# Patient Record
Sex: Male | Born: 1960 | Race: Black or African American | Hispanic: No | Marital: Single | Smoking: Never smoker
Health system: Southern US, Community
[De-identification: ages and names within clinical notes are randomized; demographics above are authoritative.]

## PROBLEM LIST (undated history)

## (undated) DIAGNOSIS — N186 End stage renal disease: Secondary | ICD-10-CM

## (undated) DIAGNOSIS — I10 Essential (primary) hypertension: Secondary | ICD-10-CM

## (undated) DIAGNOSIS — Z992 Dependence on renal dialysis: Secondary | ICD-10-CM

## (undated) DIAGNOSIS — F209 Schizophrenia, unspecified: Secondary | ICD-10-CM

## (undated) DIAGNOSIS — M1990 Unspecified osteoarthritis, unspecified site: Secondary | ICD-10-CM

## (undated) DIAGNOSIS — D649 Anemia, unspecified: Secondary | ICD-10-CM

## (undated) DIAGNOSIS — J449 Chronic obstructive pulmonary disease, unspecified: Secondary | ICD-10-CM

## (undated) DIAGNOSIS — E785 Hyperlipidemia, unspecified: Secondary | ICD-10-CM

## (undated) DIAGNOSIS — I701 Atherosclerosis of renal artery: Secondary | ICD-10-CM

## (undated) DIAGNOSIS — J9610 Chronic respiratory failure, unspecified whether with hypoxia or hypercapnia: Secondary | ICD-10-CM

## (undated) DIAGNOSIS — E119 Type 2 diabetes mellitus without complications: Secondary | ICD-10-CM

## (undated) DIAGNOSIS — I82409 Acute embolism and thrombosis of unspecified deep veins of unspecified lower extremity: Secondary | ICD-10-CM

## (undated) HISTORY — PX: AV FISTULA PLACEMENT: SHX1204

## (undated) HISTORY — DX: Schizophrenia, unspecified: F20.9

## (undated) HISTORY — PX: OTHER SURGICAL HISTORY: SHX169

## (undated) HISTORY — PX: INSERTION OF DIALYSIS CATHETER: SHX1324

## (undated) HISTORY — DX: Essential (primary) hypertension: I10

## (undated) HISTORY — DX: End stage renal disease: N18.6

## (undated) HISTORY — PX: TONSILLECTOMY: SUR1361

---

## 2007-05-30 ENCOUNTER — Encounter (HOSPITAL_COMMUNITY): Admission: RE | Admit: 2007-05-30 | Discharge: 2007-06-01 | Payer: Self-pay | Admitting: Endocrinology

## 2008-04-19 ENCOUNTER — Encounter (HOSPITAL_COMMUNITY): Admission: RE | Admit: 2008-04-19 | Discharge: 2008-05-19 | Payer: Self-pay | Admitting: Endocrinology

## 2012-12-20 ENCOUNTER — Other Ambulatory Visit (HOSPITAL_COMMUNITY): Payer: Self-pay | Admitting: Nephrology

## 2012-12-20 DIAGNOSIS — N289 Disorder of kidney and ureter, unspecified: Secondary | ICD-10-CM

## 2013-01-10 ENCOUNTER — Ambulatory Visit (HOSPITAL_COMMUNITY)
Admission: RE | Admit: 2013-01-10 | Discharge: 2013-01-10 | Disposition: A | Discharge status: Home / Self Care | Payer: Medicaid Other | Source: Ambulatory Visit | Attending: Nephrology | Admitting: Nephrology

## 2013-01-10 DIAGNOSIS — Q619 Cystic kidney disease, unspecified: Secondary | ICD-10-CM | POA: Insufficient documentation

## 2013-01-10 DIAGNOSIS — N289 Disorder of kidney and ureter, unspecified: Secondary | ICD-10-CM

## 2013-02-15 ENCOUNTER — Encounter (INDEPENDENT_AMBULATORY_CARE_PROVIDER_SITE_OTHER): Payer: Self-pay | Admitting: *Deleted

## 2013-03-22 ENCOUNTER — Ambulatory Visit (INDEPENDENT_AMBULATORY_CARE_PROVIDER_SITE_OTHER): Payer: Medicaid Other | Admitting: Internal Medicine

## 2013-03-23 ENCOUNTER — Encounter (INDEPENDENT_AMBULATORY_CARE_PROVIDER_SITE_OTHER): Payer: Self-pay | Admitting: Internal Medicine

## 2013-03-23 ENCOUNTER — Ambulatory Visit (INDEPENDENT_AMBULATORY_CARE_PROVIDER_SITE_OTHER): Payer: Medicaid Other | Admitting: Internal Medicine

## 2013-03-23 VITALS — BP 112/70 | HR 60 | Temp 98.8°F | Ht 76.0 in | Wt 175.8 lb

## 2013-03-23 DIAGNOSIS — B1920 Unspecified viral hepatitis C without hepatic coma: Secondary | ICD-10-CM

## 2013-03-23 DIAGNOSIS — I10 Essential (primary) hypertension: Secondary | ICD-10-CM

## 2013-03-23 DIAGNOSIS — F209 Schizophrenia, unspecified: Secondary | ICD-10-CM | POA: Insufficient documentation

## 2013-03-23 DIAGNOSIS — N189 Chronic kidney disease, unspecified: Secondary | ICD-10-CM

## 2013-03-23 NOTE — Progress Notes (Signed)
Subjective:     Patient ID: Todd Brown, male   DOB: 01/17/1961, 52 y.o.   MRN: [id number]  HPI Patient is a 52 yr old black male referred to our office by Dr. Hinda Lenis for a positive Hepatitis C.  He is a resident of [place]. Hx of Schizophrenia. Resident for 6-7 yrs. Before [facility], He was at [facility] which has now closed. He does not have tattoos. He has never received a blood transfusion. He denies IV drug use. Appetite is good. No weight loss. No abdominal pain.  He usually has a BM once a day. No melena or bright red rectal bleeding.  It appears he does not have risk factors for Hepatitis C. His last office visit with Dr. Hinda Lenis in September, he was taken off Naproxen.   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 8.06/2012 H and H 11.5 and 33.6, TIBC 161, UIBC 268, % Sat 40, NA 134, K 4.2, Chloride 102, glucose 77, BUN 39, Creatinine 3.18, Albumin 4.3, Calcium 9.1, Phosphorus 4.5, Vitamin B12 958, Folate greater than 20, Ferritin 302,, HA1C 5.4 Hepatitis B Surface Antigen negative Hepatitis C antibody reactive.     Current Outpatient Prescriptions  Medication Sig Dispense Refill  . amLODipine (NORVASC) 5 MG tablet Take 5 mg by mouth daily.      Marland Kitchen atenolol (TENORMIN) 25 MG tablet Take 25 mg by mouth daily.      . baclofen (LIORESAL) 10 MG tablet Take 10 mg by mouth 3 (three) times daily.      . carbamazepine (TEGRETOL) 200 MG tablet Take 200 mg by mouth 2 (two) times daily. Taken 2 1/2 tabs (500mg  ) twice a day      . citalopram (CELEXA) 20 MG tablet Take 20 mg by mouth daily.      . Fe Fum-FA-B Cmp-C-Zn-Mg-Mn-Cu (FERROCITE PLUS PO) Take by mouth daily.      . fluPHENAZine (PROLIXIN) 2.5 MG tablet Take 2.5 mg by mouth daily.      . fluPHENAZine decanoate (PROLIXIN) 25 MG/ML injection Inject 37.5 mg into the muscle every 14 (fourteen) days.      Marland Kitchen lisinopril (PRINIVIL,ZESTRIL) 5 MG tablet Take 5 mg by mouth daily.      Marland Kitchen omeprazole (PRILOSEC)  20 MG capsule Take 20 mg by mouth daily.      . traMADol (ULTRAM) 50 MG tablet Take 50 mg by mouth every 6 (six) hours as needed for pain.      . trihexyphenidyl (ARTANE) 5 MG tablet Take 5 mg by mouth 3 (three) times daily with meals.       No current facility-administered medications for this visit.   Past Medical History  Diagnosis Date  . ESRD (end stage renal disease)   . Hypertension   . Schizophrenia    Past Surgical History  Procedure Laterality Date  . Repair fx left lower leg      yrs ago (age 52)   Allergies  Allergen Reactions  . Thorazine [Chlorpromazine]      Review of Systems       Objective:   Physical Exam  Filed Vitals:   03/23/13 1432  BP: 112/70  Pulse: 60  Temp: 98.8 F (37.1 C)  Height: 6\' 4"  (1.93 m)  Weight: 175 lb 12.8 oz (79.742 kg)   Alert and oriented. Skin warm and dry. Oral mucosa is moist.   . Sclera anicteric, conjunctivae is pink. Thyroid not enlarged. No cervical lymphadenopathy. Lungs clear. Heart  regular rate and rhythm.  Abdomen is soft. Bowel sounds are positive. No hepatomegaly. No abdominal masses felt. No tenderness.  No edema to lower extremities.       Assessment:     Hepatitis C.He does not have any risk factors. Will see if he has a viral load.     Plan:    Hepatitis C quaint. Hepatitis C genotype, AFP, PT/INR, TSH,Cmet, US abdomen. OV in 2 months. I will need to discuss with DR. Rehman if he has a viral load. Hx of schizophrenia.

## 2013-03-23 NOTE — Patient Instructions (Signed)
Labs. OV in 2 months.

## 2013-03-24 LAB — COMPREHENSIVE METABOLIC PANEL
Albumin: 3.8 g/dL (ref 3.5–5.2)
Alkaline Phosphatase: 86 U/L (ref 39–117)
BUN: 46 mg/dL — ABNORMAL HIGH (ref 6–23)
Glucose, Bld: 102 mg/dL — ABNORMAL HIGH (ref 70–99)
Potassium: 5.6 mEq/L — ABNORMAL HIGH (ref 3.5–5.3)
Total Bilirubin: 0.3 mg/dL (ref 0.3–1.2)

## 2013-03-24 LAB — AFP TUMOR MARKER: AFP-Tumor Marker: 2.7 ng/mL (ref 0.0–8.0)

## 2013-03-27 ENCOUNTER — Other Ambulatory Visit (INDEPENDENT_AMBULATORY_CARE_PROVIDER_SITE_OTHER): Payer: Self-pay | Admitting: Internal Medicine

## 2013-03-27 ENCOUNTER — Telehealth (INDEPENDENT_AMBULATORY_CARE_PROVIDER_SITE_OTHER): Payer: Self-pay | Admitting: Internal Medicine

## 2013-03-27 ENCOUNTER — Ambulatory Visit (HOSPITAL_COMMUNITY)
Admission: RE | Admit: 2013-03-27 | Discharge: 2013-03-27 | Disposition: A | Discharge status: Home / Self Care | Payer: Medicaid Other | Source: Ambulatory Visit | Attending: Internal Medicine | Admitting: Internal Medicine

## 2013-03-27 DIAGNOSIS — B1920 Unspecified viral hepatitis C without hepatic coma: Secondary | ICD-10-CM

## 2013-03-27 LAB — HEPATITIS C RNA QUANTITATIVE: HCV Quantitative: NOT DETECTED IU/mL (ref <15)

## 2013-03-27 NOTE — Telephone Encounter (Signed)
Results given to caregiver.

## 2013-03-28 LAB — HEPATITIS C GENOTYPE

## 2013-03-29 ENCOUNTER — Telehealth (INDEPENDENT_AMBULATORY_CARE_PROVIDER_SITE_OTHER): Payer: Self-pay | Admitting: *Deleted

## 2013-03-29 DIAGNOSIS — B1920 Unspecified viral hepatitis C without hepatic coma: Secondary | ICD-10-CM

## 2013-03-29 NOTE — Telephone Encounter (Signed)
.  Per Lelon Perla, patient to have labs in 6 months.

## 2013-04-06 NOTE — Progress Notes (Signed)
Apt has been moved to 09/26/13 with Deberah Castle, NP.

## 2013-04-06 NOTE — Progress Notes (Signed)
Apt has been moved out to 09/26/13 with Deberah Castle, NP.

## 2013-05-23 ENCOUNTER — Ambulatory Visit (INDEPENDENT_AMBULATORY_CARE_PROVIDER_SITE_OTHER): Payer: Medicaid Other | Admitting: Internal Medicine

## 2013-05-30 ENCOUNTER — Telehealth: Payer: Self-pay

## 2013-05-30 NOTE — Telephone Encounter (Signed)
Pt was referred by Dr. Legrand Rams for screening colonoscopy.

## 2013-05-30 NOTE — Telephone Encounter (Signed)
I called Levie Heritage, at [facility], and scheduled pt an OV with Laban Emperor, NP on 06/27/2013 at 11:00 AM due to meds.

## 2013-06-27 ENCOUNTER — Ambulatory Visit: Payer: Medicaid Other | Admitting: Gastroenterology

## 2013-08-23 ENCOUNTER — Encounter (INDEPENDENT_AMBULATORY_CARE_PROVIDER_SITE_OTHER): Payer: Self-pay | Admitting: *Deleted

## 2013-08-23 ENCOUNTER — Other Ambulatory Visit (INDEPENDENT_AMBULATORY_CARE_PROVIDER_SITE_OTHER): Payer: Self-pay | Admitting: *Deleted

## 2013-08-23 DIAGNOSIS — B1920 Unspecified viral hepatitis C without hepatic coma: Secondary | ICD-10-CM

## 2013-09-26 ENCOUNTER — Ambulatory Visit (INDEPENDENT_AMBULATORY_CARE_PROVIDER_SITE_OTHER): Payer: Medicaid Other | Admitting: Internal Medicine

## 2013-09-26 ENCOUNTER — Encounter (INDEPENDENT_AMBULATORY_CARE_PROVIDER_SITE_OTHER): Payer: Self-pay | Admitting: Internal Medicine

## 2013-09-26 VITALS — BP 140/82 | HR 84 | Temp 98.1°F | Ht 75.0 in | Wt 177.5 lb

## 2013-09-26 DIAGNOSIS — B1920 Unspecified viral hepatitis C without hepatic coma: Secondary | ICD-10-CM

## 2013-09-26 NOTE — Patient Instructions (Signed)
Hep C antibody, Hep C quaint.  Further recommendations to follow.

## 2013-09-26 NOTE — Progress Notes (Signed)
Subjective:     Patient ID: Todd Brown, male   DOB: [birth year], 53 y.o.   MRN: [id number]  HPI Here today for f/u of his Hepatitis C. Last seen in October of 2014. Noted to have a positive Hep C antibody. Hep Quaint was undetectable. AFP 2.7. Referred by Dr. Hinda Lenis. Resident of [place]. Hx of Schizophrenia. Has been a resident for 6-7 yrs. There were no risk factors for Hepatitis C. No IV drugs. No tattoos.  Appetite is good. No weight loss. Usually has a BM daily. 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  8.06/2012 H and H 11.5 and 33.6, TIBC 161, UIBC 268, % Sat 40, NA 134, K 4.2, Chloride 102, glucose 77, BUN 39, Creatinine 3.18, Albumin 4.3, Calcium 9.1, Phosphorus 4.5, Vitamin B12 958, Folate greater than 20, Ferritin 302,, HA1C 5.4  Hepatitis B Surface Antigen negative Hepatitis C antibody reactive.      Review of Systems Past Medical History  Diagnosis Date  . ESRD (end stage renal disease)   . Hypertension   . Schizophrenia     Past Surgical History  Procedure Laterality Date  . Repair fx left lower leg      yrs ago (age 11)    Allergies  Allergen Reactions  . Thorazine [Chlorpromazine]     Current Outpatient Prescriptions on File Prior to Visit  Medication Sig Dispense Refill  . amLODipine (NORVASC) 5 MG tablet Take 5 mg by mouth daily.      Marland Kitchen atenolol (TENORMIN) 25 MG tablet Take 25 mg by mouth daily.      . baclofen (LIORESAL) 10 MG tablet Take 10 mg by mouth 3 (three) times daily.      .  carbamazepine (TEGRETOL) 200 MG tablet Take 200 mg by mouth 2 (two) times daily. Taken 2 1/2 tabs (500mg  ) twice a day      . Fe Fum-FA-B Cmp-C-Zn-Mg-Mn-Cu (FERROCITE PLUS PO) Take by mouth daily.      . fluPHENAZine (PROLIXIN) 2.5 MG tablet Take 2.5 mg by mouth daily.      Marland Kitchen lisinopril (PRINIVIL,ZESTRIL) 5 MG tablet Take 5 mg by mouth daily.      Marland Kitchen omeprazole (PRILOSEC) 20 MG capsule Take 20 mg by mouth daily.      . traMADol (ULTRAM) 50 MG tablet Take 50 mg by mouth every 6 (six) hours as needed for pain.      . fluPHENAZine decanoate (PROLIXIN) 25 MG/ML injection Inject 37.5 mg into the muscle every 14 (fourteen) days.       No current facility-administered medications on file prior to visit.        Objective:   Physical Exam Filed Vitals:   09/26/13 1456  BP: 140/82  Pulse: 84  Temp: 98.1 F (36.7 C)  Height: 6\' 3"  (1.905 m)  Weight: 177 lb 8 oz (80.513 kg)  Alert and oriented. Skin warm and  dry. Oral mucosa is moist.  Endentulous . Sclera anicteric, conjunctivae is pink. Thyroid not enlarged. No cervical lymphadenopathy. Lungs clear. Heart regular rate and rhythm.  Abdomen is soft. Bowel sounds are positive. No hepatomegaly. No abdominal masses felt. No tenderness.  No edema to lower extremities.      Assessment:     Hepatits C with undetectable viral load. He is doing well. ? Whether this is a false positive antibody test.    Plan:    Hep C antibody, Hep C quain PCR. Further recommendations to follow.  If Hep C quaint negative, will see in a year.

## 2013-09-27 LAB — HEPATITIS C ANTIBODY: HCV Ab: NEGATIVE

## 2013-09-28 LAB — HEPATITIS C RNA QUANTITATIVE: HCV Quantitative: NOT DETECTED IU/mL (ref <15)

## 2013-11-24 ENCOUNTER — Encounter (INDEPENDENT_AMBULATORY_CARE_PROVIDER_SITE_OTHER): Payer: Self-pay

## 2014-02-23 ENCOUNTER — Telehealth: Payer: Self-pay

## 2014-02-23 NOTE — Telephone Encounter (Signed)
PLEASE CALL [facility] TO SCHEDULE COLONOSCOPY FOR PATIENT  [phone]

## 2014-02-27 NOTE — Telephone Encounter (Signed)
[facility], she gave me some info and will fax the medication list over.

## 2014-03-28 NOTE — Telephone Encounter (Signed)
I called and updated the triage info with [facility]. Pt is capable of signing for himself. Sending triage to Dr. Oneida Alar before appt given.

## 2014-03-28 NOTE — Telephone Encounter (Signed)
Gastroenterology Pre-Procedure Review  Request Date: 03/28/2014 Requesting Physician: Dr. Legrand Rams  PATIENT REVIEW QUESTIONS: The patient responded to the following health history questions as indicated:    1. Diabetes Melitis: no 2. Joint replacements in the past 12 months: no 3. Major health problems in the past 3 months: no 4. Has an artificial valve or MVP: no 5. Has a defibrillator: no 6. Has been advised in past to take antibiotics in advance of a procedure like teeth cleaning: no    MEDICATIONS & ALLERGIES:    Patient reports the following regarding taking any blood thinners:   Plavix? no Aspirin? no Coumadin? no  Patient confirms/reports the following medications:  Current Outpatient Prescriptions  Medication Sig Dispense Refill  . amLODipine (NORVASC) 5 MG tablet Take 5 mg by mouth daily.      Marland Kitchen atenolol (TENORMIN) 25 MG tablet Take 25 mg by mouth 2 (two) times daily.       . carbamazepine (TEGRETOL) 200 MG tablet Take 200 mg by mouth 2 (two) times daily. Taken 2 1/2 tabs (500mg  ) twice a day      . citalopram (CELEXA) 20 MG tablet Take 20 mg by mouth daily.      . Fe Fum-FA-B Cmp-C-Zn-Mg-Mn-Cu (FERROCITE PLUS PO) Take by mouth daily.      . fluPHENAZine (PROLIXIN) 2.5 MG tablet Take 2.5 mg by mouth daily.      . fluPHENAZine decanoate (PROLIXIN) 25 MG/ML injection Inject 37.5 mg into the muscle every 14 (fourteen) days.      . NON FORMULARY Vitamin D 1000 softgels     One capsule by mouth daily      . omeprazole (PRILOSEC) 20 MG capsule Take 20 mg by mouth daily.      . trihexyphenidyl (ARTANE) 5 MG tablet Take 5 mg by mouth 3 (three) times daily with meals.      . baclofen (LIORESAL) 10 MG tablet Take 10 mg by mouth 3 (three) times daily.      Marland Kitchen lisinopril (PRINIVIL,ZESTRIL) 5 MG tablet Take 5 mg by mouth daily.      . traMADol (ULTRAM) 50 MG tablet Take 50 mg by mouth every 6 (six) hours as needed for pain.       No current facility-administered medications for this  visit.    Patient confirms/reports the following allergies:  Allergies  Allergen Reactions  . Thorazine [Chlorpromazine]     No orders of the defined types were placed in this encounter.    AUTHORIZATION INFORMATION Primary Insurance:   ID #:   Group #:  Pre-Cert / Auth required: Pre-Cert / Auth #:   Secondary Insurance:   ID #:   Group #:  Pre-Cert / Auth required: Pre-Cert / Auth #:   SCHEDULE INFORMATION: Procedure has been scheduled as follows:  Date:         Time:   Location:   This Gastroenterology Pre-Precedure Review Form is being routed to the following provider(s): Barney Drain, MD

## 2014-04-02 NOTE — Telephone Encounter (Signed)
MOVI PREP SPLIT DOSING, FULL LIQUIDS WITH BREAKFAST.    Full Liquid Diet A high-calorie, high-protein supplement should be used to meet your nutritional requirements when the full liquid diet is continued for more than 2 or 3 days. If this diet is to be used for an extended period of time (more than 7 days), a multivitamin should be considered.  Breads and Starches  Allowed: None are allowed except crackers WHOLE OR pureed (made into a thick, smooth soup) in soup.   Avoid: Any others.    Potatoes/Pasta/Rice  Allowed: ANY ITEM AS A SOUP OR SMALL PLATE OF MASHED POTATOES.       Vegetables  Allowed: Strained tomato or vegetable juice. Vegetables pureed in soup.   Avoid: Any others.    Fruit  Allowed: Any strained fruit juices and fruit drinks. Include 1 serving of citrus or vitamin C-enriched fruit juice daily.   Avoid: Any others.  Meat and Meat Substitutes  Allowed: Egg  Avoid: Any meat, fish, or fowl. All cheese.  Milk  Allowed: Milk beverages, including milk shakes and instant breakfast mixes. Smooth yogurt.   Avoid: Any others. Avoid dairy products if not tolerated.    Soups and Combination Foods  Allowed: Broth, strained cream soups. Strained, broth-based soups.   Avoid: Any others.    Desserts and Sweets  Allowed: flavored gelatin,plain ice cream, sherbet, smooth pudding, junket, fruit ices, frozen ice pops, pudding pops,, frozen fudge pops, chocolate syrup. Sugar, honey, jelly, syrup.   Avoid: Any others.  Fats and Oils  Allowed: Margarine, butter, cream, sour cream, oils.   Avoid: Any others.  Beverages  Allowed: All.   Avoid: None.  Condiments  Allowed: Iodized salt, pepper, spices, flavorings. Cocoa powder.   Avoid: Any others.    SAMPLE MEAL PLAN Breakfast   cup orange juice.   1 OR 2 EGGS   1 cup  milk.   1 cup beverage (coffee or tea).   Cream or sugar, if desired.    Midmorning Snack  2 SCRAMBLED OR HARD BOILED  EGG   Lunch  1 cup cream soup.    cup fruit juice.   1 cup milk.    cup custard.   1 cup beverage (coffee or tea).   Cream or sugar, if desired.    Midafternoon Snack  1 cup milk shake.  Dinner  1 cup cream soup.    cup fruit juice.   1 cup milk.    cup pudding.   1 cup beverage (coffee or tea).   Cream or sugar, if desired.  Evening Snack  1 cup supplement.  To increase calories, add sugar, cream, butter, or margarine if possible. Nutritional supplements will also increase the total calories.

## 2014-04-03 ENCOUNTER — Other Ambulatory Visit: Payer: Self-pay

## 2014-04-03 DIAGNOSIS — Z1211 Encounter for screening for malignant neoplasm of colon: Secondary | ICD-10-CM

## 2014-04-03 MED ORDER — PEG-KCL-NACL-NASULF-NA ASC-C 100 G PO SOLR: 1.0000 | ORAL | Status: DC

## 2014-04-03 NOTE — Telephone Encounter (Signed)
Pt is scheduled colonoscopy with Dr. Oneida Alar on 05/04/2014 at 9:30 AM. Per [name], pt able to sign for himself. Rx sent to pharmacy and instructions mailed to pt. Pt will hold iron for 7 days prior to procedure.

## 2014-04-03 NOTE — Addendum Note (Signed)
Addended by: Everardo All on: 04/03/2014 09:19 AM   Modules accepted: Orders

## 2014-05-01 ENCOUNTER — Telehealth: Payer: Self-pay

## 2014-05-01 NOTE — Telephone Encounter (Signed)
LMOM for [facility] to call and let me know if any changes in his medications since he was triaged.

## 2014-05-03 NOTE — Telephone Encounter (Signed)
LMOM for [facility] to call ASAP. Pt is on schedule for tomorrow and she told hospital that he is no longer with her.

## 2014-05-04 ENCOUNTER — Ambulatory Visit (HOSPITAL_COMMUNITY): Admission: RE | Admit: 2014-05-04 | Payer: Medicaid Other | Source: Ambulatory Visit | Admitting: Gastroenterology

## 2014-05-04 ENCOUNTER — Encounter (HOSPITAL_COMMUNITY): Admission: RE | Payer: Self-pay | Source: Ambulatory Visit

## 2014-05-04 SURGERY — COLONOSCOPY
Anesthesia: Moderate Sedation

## 2014-06-01 DIAGNOSIS — J189 Pneumonia, unspecified organism: Secondary | ICD-10-CM

## 2014-06-01 HISTORY — DX: Pneumonia, unspecified organism: J18.9

## 2014-10-20 ENCOUNTER — Encounter: Payer: Self-pay | Admitting: Internal Medicine

## 2014-10-20 ENCOUNTER — Inpatient Hospital Stay
Admit: 2014-10-20 | Discharge: 2014-10-31 | DRG: 683 | Disposition: A | Discharge status: Home / Self Care | Payer: Medicaid Other | Source: Other Acute Inpatient Hospital | Attending: Internal Medicine | Admitting: Internal Medicine

## 2014-10-20 DIAGNOSIS — Z79899 Other long term (current) drug therapy: Secondary | ICD-10-CM | POA: Diagnosis not present

## 2014-10-20 DIAGNOSIS — E872 Acidosis: Secondary | ICD-10-CM | POA: Diagnosis present

## 2014-10-20 DIAGNOSIS — N059 Unspecified nephritic syndrome with unspecified morphologic changes: Secondary | ICD-10-CM | POA: Diagnosis present

## 2014-10-20 DIAGNOSIS — Z992 Dependence on renal dialysis: Secondary | ICD-10-CM | POA: Diagnosis not present

## 2014-10-20 DIAGNOSIS — B1920 Unspecified viral hepatitis C without hepatic coma: Secondary | ICD-10-CM | POA: Diagnosis present

## 2014-10-20 DIAGNOSIS — I120 Hypertensive chronic kidney disease with stage 5 chronic kidney disease or end stage renal disease: Secondary | ICD-10-CM | POA: Diagnosis present

## 2014-10-20 DIAGNOSIS — F209 Schizophrenia, unspecified: Secondary | ICD-10-CM | POA: Diagnosis present

## 2014-10-20 DIAGNOSIS — N2581 Secondary hyperparathyroidism of renal origin: Secondary | ICD-10-CM | POA: Diagnosis present

## 2014-10-20 DIAGNOSIS — D631 Anemia in chronic kidney disease: Secondary | ICD-10-CM | POA: Diagnosis present

## 2014-10-20 DIAGNOSIS — F17210 Nicotine dependence, cigarettes, uncomplicated: Secondary | ICD-10-CM | POA: Diagnosis present

## 2014-10-20 DIAGNOSIS — N179 Acute kidney failure, unspecified: Secondary | ICD-10-CM | POA: Diagnosis present

## 2014-10-20 DIAGNOSIS — G253 Myoclonus: Secondary | ICD-10-CM | POA: Diagnosis present

## 2014-10-20 DIAGNOSIS — Z888 Allergy status to other drugs, medicaments and biological substances status: Secondary | ICD-10-CM | POA: Diagnosis not present

## 2014-10-20 DIAGNOSIS — I701 Atherosclerosis of renal artery: Secondary | ICD-10-CM | POA: Diagnosis present

## 2014-10-20 DIAGNOSIS — R0602 Shortness of breath: Secondary | ICD-10-CM

## 2014-10-20 DIAGNOSIS — N186 End stage renal disease: Secondary | ICD-10-CM | POA: Diagnosis present

## 2014-10-20 HISTORY — DX: Essential (primary) hypertension: I10

## 2014-10-20 HISTORY — DX: Anemia, unspecified: D64.9

## 2014-10-20 HISTORY — DX: Atherosclerosis of renal artery: I70.1

## 2014-10-20 MED ORDER — ACETAMINOPHEN 650 MG RE SUPP: 650.0000 mg | Freq: Four times a day (QID) | RECTAL | Status: DC | PRN

## 2014-10-20 MED ORDER — SODIUM CHLORIDE 0.9 % IV SOLN
INTRAVENOUS | Status: DC
  Administered 2014-10-20: 23:00:00 via INTRAVENOUS

## 2014-10-20 MED ORDER — SODIUM CHLORIDE 0.9 % IJ SOLN
3.0000 mL | Freq: Two times a day (BID) | INTRAMUSCULAR | Status: DC
  Administered 2014-10-21 – 2014-10-30 (×15): 3 mL via INTRAVENOUS

## 2014-10-20 MED ORDER — HALOPERIDOL LACTATE 5 MG/ML IJ SOLN
5.0000 mg | Freq: Once | INTRAMUSCULAR | Status: AC
  Administered 2014-10-20: 5 mg via INTRAMUSCULAR
  Filled 2014-10-20: qty 1

## 2014-10-20 MED ORDER — MORPHINE SULFATE 2 MG/ML IJ SOLN: 2.0000 mg | INTRAMUSCULAR | Status: DC | PRN

## 2014-10-20 MED ORDER — HEPARIN SODIUM (PORCINE) 5000 UNIT/ML IJ SOLN
5000.0000 [IU] | Freq: Three times a day (TID) | INTRAMUSCULAR | Status: DC
  Administered 2014-10-20 – 2014-10-31 (×24): 5000 [IU] via SUBCUTANEOUS
  Filled 2014-10-20 (×29): qty 1

## 2014-10-20 MED ORDER — HYDRALAZINE HCL 20 MG/ML IJ SOLN
10.0000 mg | INTRAMUSCULAR | Status: DC | PRN
  Administered 2014-10-20 – 2014-10-28 (×9): 10 mg via INTRAVENOUS
  Filled 2014-10-20 (×10): qty 1

## 2014-10-20 MED ORDER — ONDANSETRON HCL 4 MG PO TABS: 4.0000 mg | ORAL_TABLET | Freq: Four times a day (QID) | ORAL | Status: DC | PRN

## 2014-10-20 MED ORDER — ACETAMINOPHEN 325 MG PO TABS
650.0000 mg | ORAL_TABLET | Freq: Four times a day (QID) | ORAL | Status: DC | PRN
  Administered 2014-10-28: 650 mg via ORAL
  Filled 2014-10-20: qty 2

## 2014-10-20 MED ORDER — ONDANSETRON HCL 4 MG/2ML IJ SOLN: 4.0000 mg | Freq: Four times a day (QID) | INTRAMUSCULAR | Status: DC | PRN

## 2014-10-21 ENCOUNTER — Inpatient Hospital Stay: Payer: Medicaid Other

## 2014-10-21 LAB — BASIC METABOLIC PANEL
ANION GAP: 14 (ref 5–15)
Anion gap: 12 (ref 5–15)
BUN: 100 mg/dL — AB (ref 6–20)
BUN: 96 mg/dL — ABNORMAL HIGH (ref 6–20)
CO2: 13 mmol/L — ABNORMAL LOW (ref 22–32)
CO2: 13 mmol/L — ABNORMAL LOW (ref 22–32)
CREATININE: 11.69 mg/dL — AB (ref 0.61–1.24)
Calcium: 7.9 mg/dL — ABNORMAL LOW (ref 8.9–10.3)
Calcium: 8 mg/dL — ABNORMAL LOW (ref 8.9–10.3)
Chloride: 113 mmol/L — ABNORMAL HIGH (ref 101–111)
Chloride: 111 mmol/L (ref 101–111)
Creatinine, Ser: 12.07 mg/dL — ABNORMAL HIGH (ref 0.61–1.24)
GFR calc Af Amer: 5 mL/min — ABNORMAL LOW (ref >60)
GFR calc non Af Amer: 4 mL/min — ABNORMAL LOW (ref >60)
GFR, EST AFRICAN AMERICAN: 5 mL/min — AB (ref >60)
GFR, EST NON AFRICAN AMERICAN: 4 mL/min — AB (ref >60)
GLUCOSE: 109 mg/dL — AB (ref 65–99)
Glucose, Bld: 121 mg/dL — ABNORMAL HIGH (ref 65–99)
Potassium: 4.9 mmol/L (ref 3.5–5.1)
Potassium: 5.2 mmol/L — ABNORMAL HIGH (ref 3.5–5.1)
SODIUM: 138 mmol/L (ref 135–145)
Sodium: 138 mmol/L (ref 135–145)

## 2014-10-21 LAB — CBC
HCT: 15.9 % — ABNORMAL LOW (ref 40.0–52.0)
Hemoglobin: 5.2 g/dL — ABNORMAL LOW (ref 13.0–18.0)
MCH: 30.5 pg (ref 26.0–34.0)
MCHC: 32.4 g/dL (ref 32.0–36.0)
MCV: 94 fL (ref 80.0–100.0)
PLATELETS: 156 10*3/uL (ref 150–440)
RBC: 1.7 MIL/uL — ABNORMAL LOW (ref 4.40–5.90)
RDW: 15.6 % — AB (ref 11.5–14.5)
WBC: 6.6 10*3/uL (ref 3.8–10.6)

## 2014-10-21 LAB — CBC WITH DIFFERENTIAL/PLATELET
Basophils Absolute: 0.1 10*3/uL (ref 0–0.1)
Basophils Relative: 1 %
Eosinophils Absolute: 0.1 10*3/uL (ref 0–0.7)
Eosinophils Relative: 1 %
HCT: 21.6 % — ABNORMAL LOW (ref 40.0–52.0)
HEMOGLOBIN: 7.2 g/dL — AB (ref 13.0–18.0)
Lymphocytes Relative: 7 %
Lymphs Abs: 0.6 10*3/uL — ABNORMAL LOW (ref 1.0–3.6)
MCH: 30.8 pg (ref 26.0–34.0)
MCHC: 33.3 g/dL (ref 32.0–36.0)
MCV: 92.4 fL (ref 80.0–100.0)
Monocytes Absolute: 0.8 10*3/uL (ref 0.2–1.0)
NEUTROS ABS: 7.7 10*3/uL — AB (ref 1.4–6.5)
Neutrophils Relative %: 83 %
PLATELETS: 170 10*3/uL (ref 150–440)
RBC: 2.34 MIL/uL — ABNORMAL LOW (ref 4.40–5.90)
RDW: 15.7 % — AB (ref 11.5–14.5)
WBC: 9.2 10*3/uL (ref 3.8–10.6)

## 2014-10-21 LAB — PREPARE RBC (CROSSMATCH)

## 2014-10-21 LAB — ABO/RH: ABO/RH(D): O POS

## 2014-10-21 MED ORDER — FERROUS FUMARATE 325 (106 FE) MG PO TABS
1.0000 | ORAL_TABLET | Freq: Every day | ORAL | Status: DC
  Administered 2014-10-21: 12:00:00 via ORAL
  Administered 2014-10-22 – 2014-10-23 (×2): 1 via ORAL
  Administered 2014-10-24 – 2014-10-28 (×4): 106 mg via ORAL
  Administered 2014-10-29: 09:00:00 via ORAL
  Administered 2014-10-30 – 2014-10-31 (×2): 106 mg via ORAL
  Filled 2014-10-21 (×10): qty 1

## 2014-10-21 MED ORDER — DEXTROSE 5 % IV SOLN
100.0000 mg | Freq: Once | INTRAVENOUS | Status: AC
  Administered 2014-10-21: 100 mg via INTRAVENOUS
  Filled 2014-10-21: qty 10

## 2014-10-21 MED ORDER — PANTOPRAZOLE SODIUM 40 MG PO TBEC
40.0000 mg | DELAYED_RELEASE_TABLET | Freq: Every day | ORAL | Status: DC
  Administered 2014-10-21 – 2014-10-31 (×11): 40 mg via ORAL
  Filled 2014-10-21 (×11): qty 1

## 2014-10-21 MED ORDER — TRIHEXYPHENIDYL HCL 2 MG PO TABS
5.0000 mg | ORAL_TABLET | Freq: Three times a day (TID) | ORAL | Status: DC
  Administered 2014-10-21: 5 mg via ORAL
  Administered 2014-10-21: 16:00:00 via ORAL
  Administered 2014-10-23 – 2014-10-31 (×16): 5 mg via ORAL
  Filled 2014-10-21 (×18): qty 3

## 2014-10-21 MED ORDER — CEFTRIAXONE SODIUM IN DEXTROSE 20 MG/ML IV SOLN
1.0000 g | Freq: Once | INTRAVENOUS | Status: AC
  Administered 2014-10-21: 1 g via INTRAVENOUS
  Filled 2014-10-21: qty 50

## 2014-10-21 MED ORDER — IPRATROPIUM-ALBUTEROL 0.5-2.5 (3) MG/3ML IN SOLN
3.0000 mL | RESPIRATORY_TRACT | Status: DC
  Administered 2014-10-21 (×6): 3 mL via RESPIRATORY_TRACT
  Filled 2014-10-21 (×6): qty 3

## 2014-10-21 MED ORDER — VITAMIN D 1000 UNITS PO TABS
1000.0000 [IU] | ORAL_TABLET | Freq: Every day | ORAL | Status: DC
  Administered 2014-10-21 – 2014-10-31 (×10): 1000 [IU] via ORAL
  Filled 2014-10-21 (×10): qty 1

## 2014-10-21 MED ORDER — FERROCITE PLUS 106-1 MG PO TABS: ORAL_TABLET | Freq: Every day | ORAL | Status: DC

## 2014-10-21 MED ORDER — CARBAMAZEPINE 200 MG PO TABS: 200.0000 mg | ORAL_TABLET | Freq: Two times a day (BID) | ORAL | Status: DC

## 2014-10-21 MED ORDER — CARBAMAZEPINE 200 MG PO TABS
500.0000 mg | ORAL_TABLET | Freq: Two times a day (BID) | ORAL | Status: DC
  Administered 2014-10-21 – 2014-10-31 (×19): 500 mg via ORAL
  Filled 2014-10-21 (×7): qty 3
  Filled 2014-10-21: qty 1
  Filled 2014-10-21: qty 3
  Filled 2014-10-21: qty 1
  Filled 2014-10-21 (×2): qty 3
  Filled 2014-10-21: qty 1
  Filled 2014-10-21 (×6): qty 3
  Filled 2014-10-21: qty 2.5
  Filled 2014-10-21 (×2): qty 3

## 2014-10-21 MED ORDER — CEFTRIAXONE SODIUM IN DEXTROSE 20 MG/ML IV SOLN
1.0000 g | INTRAVENOUS | Status: DC
  Filled 2014-10-21: qty 50

## 2014-10-21 MED ORDER — SODIUM CHLORIDE 0.9 % IV SOLN
Freq: Once | INTRAVENOUS | Status: AC
  Administered 2014-10-21 (×2): via INTRAVENOUS

## 2014-10-21 MED ORDER — ATENOLOL 25 MG PO TABS
25.0000 mg | ORAL_TABLET | Freq: Two times a day (BID) | ORAL | Status: DC
  Administered 2014-10-21 – 2014-10-31 (×21): 25 mg via ORAL
  Filled 2014-10-21 (×21): qty 1

## 2014-10-21 MED ORDER — AMLODIPINE BESYLATE 5 MG PO TABS
5.0000 mg | ORAL_TABLET | Freq: Every day | ORAL | Status: DC
  Administered 2014-10-21 – 2014-10-22 (×2): 5 mg via ORAL
  Filled 2014-10-21 (×2): qty 1

## 2014-10-21 MED ORDER — FLUPHENAZINE HCL 2.5 MG PO TABS
2.5000 mg | ORAL_TABLET | Freq: Every day | ORAL | Status: DC
  Administered 2014-10-21 – 2014-10-31 (×10): 2.5 mg via ORAL
  Filled 2014-10-21 (×10): qty 1

## 2014-10-21 MED ORDER — TUBERCULIN PPD 5 UNIT/0.1ML ID SOLN
5.0000 [IU] | Freq: Once | INTRADERMAL | Status: AC
  Administered 2014-10-21: 5 [IU] via INTRADERMAL
  Filled 2014-10-21: qty 0.1

## 2014-10-21 MED ORDER — IPRATROPIUM-ALBUTEROL 0.5-2.5 (3) MG/3ML IN SOLN
3.0000 mL | RESPIRATORY_TRACT | Status: DC | PRN
  Administered 2014-10-22 – 2014-10-28 (×5): 3 mL via RESPIRATORY_TRACT
  Filled 2014-10-21 (×4): qty 3

## 2014-10-21 MED ORDER — CITALOPRAM HYDROBROMIDE 20 MG PO TABS
20.0000 mg | ORAL_TABLET | Freq: Every day | ORAL | Status: DC
  Administered 2014-10-21 – 2014-10-31 (×11): 20 mg via ORAL
  Filled 2014-10-21 (×11): qty 1

## 2014-10-21 MED ORDER — FUROSEMIDE 10 MG/ML IJ SOLN: 100.0000 mg | Freq: Once | INTRAVENOUS | Status: DC

## 2014-10-21 MED ORDER — DEXTROSE 5 % IV SOLN
500.0000 mg | INTRAVENOUS | Status: DC
  Filled 2014-10-21: qty 500

## 2014-10-21 MED ORDER — DEXTROSE 5 % IV SOLN
500.0000 mg | Freq: Once | INTRAVENOUS | Status: AC
  Administered 2014-10-21: 500 mg via INTRAVENOUS
  Filled 2014-10-21: qty 500

## 2014-10-21 NOTE — Consult Note (Signed)
[facility]  CONSULT NOTE    Date: 10/21/2014                  Patient Name:  Todd Brown  MRN: [id number]  DOB: 04-05-1961  Age / Sex: 54 y.o., male         PCP: Rosita Fire, MD                 Service Requesting Consult: Dr. Tressia Miners                 Reason for Consult: Acute Renal Failure            History of Present Illness: [name] is a 54 y.o.  male with schizophrenia, hyeprtension, hepatitis C, tobacco abuse who was admitted to [facility] on 10/20/2014 for shortness of breath and increasing lower extremity edema.  Patient's mother is at bedside and assists with history taking. Patient is a poor historian. He lives his [place], [place]. However his PCP is Dr. Legrand Rams in [place], [place]. Patient's mother lives in [place] where patient was visiting for the weekend. Patient was getting more and more confused. He started to have shortness of breath. He was taken to [facility] ED where he was transferred to [facility] since his doctors are here.  Mother states that patient has been having progressive shortness of breath and peripheral edema for several weeks.  Blood pressure very elevated on admission. Home blood pressure regimen of amlodipine and atenolol. Patient is unaware that he has any kidney issues. He does not follow with a nephrologist.  Patient states he has a good appetite, denies lethargy or any uremic symptoms.   Medications: Outpatient medications: Prescriptions prior to admission  Medication Sig Dispense Refill Last Dose  . amLODipine (NORVASC) 5 MG tablet Take 5 mg by mouth daily.   10/21/2014 at Unknown time  . atenolol (TENORMIN) 25 MG tablet Take 25 mg by mouth 2 (two) times daily.    10/21/2014 at Unknown time  . carbamazepine (TEGRETOL) 200 MG tablet Take 200 mg by mouth 2 (two) times daily. Taken 2 1/2 tabs (548m ) twice a day   10/21/2014 at Unknown time  . cholecalciferol (VITAMIN D) 1000 UNITS tablet Take 1,000 Units by mouth  daily.   10/21/2014 at Unknown time  . citalopram (CELEXA) 20 MG tablet Take 20 mg by mouth daily.   10/21/2014 at Unknown time  . fluPHENAZine (PROLIXIN) 2.5 MG tablet Take 2.5 mg by mouth daily.   10/21/2014 at Unknown time  . trihexyphenidyl (ARTANE) 5 MG tablet Take 5 mg by mouth 3 (three) times daily with meals.   10/21/2014 at Unknown time  . Fe Fum-FA-B Cmp-C-Zn-Mg-Mn-Cu (FERROCITE PLUS PO) Take by mouth daily.   Taking  . fluPHENAZine decanoate (PROLIXIN) 25 MG/ML injection Inject 37.5 mg into the muscle every 14 (fourteen) days.   Taking  . omeprazole (PRILOSEC) 20 MG capsule Take 20 mg by mouth daily.   Taking  . peg 3350 powder (MOVIPREP) 100 G SOLR Take 1 kit (200 g total) by mouth as directed. (Patient not taking: Reported on 10/21/2014) 1 kit 0 Not Taking  . traMADol (ULTRAM) 50 MG tablet Take 50 mg by mouth every 6 (six) hours as needed for pain.   Taking    Current medications: Current Facility-Administered Medications  Medication Dose Route Frequency Provider Last Rate Last Dose  . acetaminophen (TYLENOL) tablet 650 mg  650 mg Oral Q6H PRN DLytle Butte MD  Or  . acetaminophen (TYLENOL) suppository 650 mg  650 mg Rectal Q6H PRN Lytle Butte, MD      . amLODipine (NORVASC) tablet 5 mg  5 mg Oral Daily Lytle Butte, MD   5 mg at 10/21/14 1154  . atenolol (TENORMIN) tablet 25 mg  25 mg Oral BID Lytle Butte, MD   25 mg at 10/21/14 1156  . carbamazepine (TEGRETOL) tablet 500 mg  500 mg Oral BID Lytle Butte, MD   500 mg at 10/21/14 1153  . cholecalciferol (VITAMIN D) tablet 1,000 Units  1,000 Units Oral Daily Lytle Butte, MD   1,000 Units at 10/21/14 1155  . citalopram (CELEXA) tablet 20 mg  20 mg Oral Daily Lytle Butte, MD   20 mg at 10/21/14 1154  . ferrous fumarate (HEMOCYTE - 106 mg FE) tablet 106 mg of iron  1 tablet Oral Daily Lytle Butte, MD      . fluPHENAZine (PROLIXIN) tablet 2.5 mg  2.5 mg Oral Daily Lytle Butte, MD   2.5 mg at 10/21/14 1157  . heparin  injection 5,000 Units  5,000 Units Subcutaneous 3 times per day Lytle Butte, MD   5,000 Units at 10/20/14 2254  . hydrALAZINE (APRESOLINE) injection 10 mg  10 mg Intravenous Q4H PRN Lytle Butte, MD   10 mg at 10/20/14 2329  . ipratropium-albuterol (DUONEB) 0.5-2.5 (3) MG/3ML nebulizer solution 3 mL  3 mL Nebulization Q4H Lytle Butte, MD   3 mL at 10/21/14 1142  . morphine 2 MG/ML injection 2 mg  2 mg Intravenous Q4H PRN Lytle Butte, MD      . ondansetron [facility]) tablet 4 mg  4 mg Oral Q6H PRN Lytle Butte, MD       Or  . ondansetron [facility]) injection 4 mg  4 mg Intravenous Q6H PRN Lytle Butte, MD      . pantoprazole (PROTONIX) EC tablet 40 mg  40 mg Oral Daily Lytle Butte, MD   40 mg at 10/21/14 1154  . sodium chloride 0.9 % injection 3 mL  3 mL Intravenous Q12H Lytle Butte, MD   3 mL at 10/20/14 2200  . trihexyphenidyl (ARTANE) tablet 5 mg  5 mg Oral TID WC Lytle Butte, MD   5 mg at 10/21/14 1155      Allergies: Allergies  Allergen Reactions  . Thorazine [Chlorpromazine]       Past Medical History: Past Medical History  Diagnosis Date  . ESRD (end stage renal disease)   . Hypertension   . Schizophrenia      Past Surgical History: Past Surgical History  Procedure Laterality Date  . Repair fx left lower leg      yrs ago (age 39)     Family History: Family History  Problem Relation Age of Onset  . Diabetes Neg Hx      Social History: History   Social History  . Marital Status: Single    Spouse Name: N/A  . Number of Children: N/A  . Years of Education: N/A   Occupational History  . Not on file.   Social History Main Topics  . Smoking status: Current Every Day Smoker  . Smokeless tobacco: Not on file     Comment: 10 cigars a day  . Alcohol Use: No  . Drug Use: No  . Sexual Activity: Not on file   Other Topics Concern  . Not on file  Social History Narrative     Review of Systems: Review of Systems  Constitutional: Negative for  fever, chills, weight loss, malaise/fatigue and diaphoresis.  HENT: Negative for congestion, ear discharge, ear pain, hearing loss, nosebleeds and sore throat.   Eyes: Negative for blurred vision, double vision, photophobia, pain, discharge and redness.  Respiratory: Positive for shortness of breath and wheezing. Negative for cough, hemoptysis, sputum production and stridor.   Cardiovascular: Positive for orthopnea and leg swelling. Negative for chest pain, palpitations, claudication and PND.  Gastrointestinal: Negative for heartburn, nausea, vomiting, abdominal pain, diarrhea, constipation, blood in stool and melena.  Genitourinary: Negative for urgency, frequency, hematuria and flank pain.  Musculoskeletal: Negative for myalgias, back pain, joint pain, falls and neck pain.  Skin: Negative for itching and rash.  Neurological: Negative for tingling, tremors, sensory change, speech change, focal weakness, seizures, loss of consciousness, weakness and headaches.  Endo/Heme/Allergies: Negative for environmental allergies and polydipsia. Does not bruise/bleed easily.  Psychiatric/Behavioral: Positive for hallucinations and substance abuse. Negative for depression, suicidal ideas and memory loss. The patient is not nervous/anxious and does not have insomnia.     Vital Signs: Blood pressure 155/78, pulse 88, temperature 98.2 F (36.8 C), temperature source Oral, resp. rate 17, height 5' 9"  (1.753 m), weight 85.548 kg (188 lb 9.6 oz), SpO2 99 %.  Weight trends: Filed Weights   10/20/14 2155  Weight: 85.548 kg (188 lb 9.6 oz)    Physical Exam: General: NAD,   Head: Normocephalic, atraumatic. Moist oral mucosal membranes  Eyes: Anicteric, PERRL  Neck: Supple, trachea midline  Lungs:  Clear to auscultation  Heart: Regular rate and rhythm  Abdomen:  Soft, nontender,   Extremities:  + peripheral edema.  Neurologic: Nonfocal, moving all four extremities  Skin: No lesions  Access: none      Lab results: Basic Metabolic Panel:  Recent Labs Lab 10/21/14 0423  NA 138  K 4.9  CL 113*  CO2 13*  GLUCOSE 109*  BUN 100*  CREATININE 12.07*  CALCIUM 7.9*    Liver Function Tests: No results for input(s): AST, ALT, ALKPHOS, BILITOT, PROT, ALBUMIN in the last 168 hours. No results for input(s): LIPASE, AMYLASE in the last 168 hours. No results for input(s): AMMONIA in the last 168 hours.  CBC:  Recent Labs Lab 10/21/14 0423  WBC 6.6  HGB 5.2*  HCT 15.9*  MCV 94.0  PLT 156    Cardiac Enzymes: No results for input(s): CKTOTAL, CKMB, CKMBINDEX, TROPONINI in the last 168 hours.  BNP: Invalid input(s): POCBNP  CBG: No results for input(s): GLUCAP in the last 168 hours.  Microbiology: No results found for this or any previous visit.  Coagulation Studies: No results for input(s): LABPROT, INR in the last 72 hours.  Urinalysis: No results for input(s): COLORURINE, LABSPEC, PHURINE, GLUCOSEU, HGBUR, BILIRUBINUR, KETONESUR, PROTEINUR, UROBILINOGEN, NITRITE, LEUKOCYTESUR in the last 72 hours.  Invalid input(s): APPERANCEUR    Imaging: Dg Chest Port 1 View  10/21/2014   CLINICAL DATA:  Shortness of breath.  EXAM: PORTABLE CHEST - 1 VIEW  COMPARISON:  None.  FINDINGS: Enlarged cardiac silhouette. Mildly prominent pulmonary vasculature and interstitial markings. No pleural fluid. Unremarkable bones.  IMPRESSION: 1. Mild cardiomegaly and mild pulmonary vascular congestion. 2. Mild chronic interstitial lung disease.   Electronically Signed   By: Claudie Revering M.D.   On: 10/21/2014 09:21      Assessment & Plan: Mr. SAMAEL BLADES is a 54 y.o.  male withschizophrenia, hyeprtension, hepatitis C,  tobacco abuse who was admitted to [facility] on 10/20/2014 for acute renal failure versus end stage renal disease, anemia, shortness of breath and edema.   1. Acute renal failure on chronic kidney disease stage III versus End stage renal Disease: patient with BUN of 100 and  eGFR of 5.  - will repeat labs. Mother states patient is looking and feeling better after IV fluids and blood transfusion.  - discussed dialysis. Patient would like to be closer to family in [place] if he was to be started on hemodialysis - Full work up ordered. Ultrasound reviewed with family, will have vascular surgery comment on possible right renal artery stenosis.  - Vascular surgery consulted for hemodialysis access.  - tentatively plan for hemodialysis initial treatment tomorrow.  - Will have care management consult for patient's mother's concerns.   2. Severe metabolic acidosis. Secondary to renal failure - if no improvement, dialysis as above.   3. Anemia of chronic kidney disease: 2 units PRBC ordered.   4. Secondary Hyperparathyroidism: hypocalcemia consistent with SPTH of renal origin - Check PTH and phosphorus      LOS: Painter, Yeimi Debnam 5/22/20161:22 PM

## 2014-10-21 NOTE — Progress Notes (Signed)
Called Dr. Marcille Blanco @ [phone] to notify him of pt's Hgb-5.2.  Getting consent printed out.  Dr. Marcille Blanco will put in orders for blood transfusion.

## 2014-10-21 NOTE — Consult Note (Signed)
Todd Brown  MRN : [id number]  Todd Brown is a 54 y.o. (1961/01/20) male who presents with chief complaint of altered mental status and shortness of breath.  History of Present Illness: Patient is a 54 year old male who was admitted to the hospital earlier today. He has a long-standing history of stage III chronic kidney disease. The nephrologists consult Brown mentions right renal artery stenosis but the patient is unclear about this diagnosis. His kidney ultrasound here at the hospital showed a smaller right kidney than the left with medical kidney disease bilaterally. True renal artery duplex is not available at our institution. He presents with a BUN of 100 and a creatinine of greater than 10. His shortness of breath has been progressing over days to weeks and over the past 48 hours he has become quite confused. He is admitted for initiation of dialysis as this is likely end-stage renal disease. His renal decline has been over several years. He reports no previous dialysis treatments. He has multiple medical comorbidities as listed below. He has no fevers or chills or signs of systemic infection.  Current Facility-Administered Medications  Medication Dose Route Frequency Provider Last Rate Last Dose  . acetaminophen (TYLENOL) tablet 650 mg  650 mg Oral Q6H PRN Lytle Butte, MD       Or  . acetaminophen (TYLENOL) suppository 650 mg  650 mg Rectal Q6H PRN Lytle Butte, MD      . amLODipine (NORVASC) tablet 5 mg  5 mg Oral Daily Lytle Butte, MD   5 mg at 10/21/14 1154  . atenolol (TENORMIN) tablet 25 mg  25 mg Oral BID Lytle Butte, MD   25 mg at 10/21/14 1156  . carbamazepine (TEGRETOL) tablet 500 mg  500 mg Oral BID Lytle Butte, MD   500 mg at 10/21/14 1153  . cholecalciferol (VITAMIN D) tablet 1,000 Units  1,000 Units Oral Daily Lytle Butte, MD   1,000 Units at 10/21/14 1155  . citalopram (CELEXA) tablet 20 mg  20 mg Oral Daily Lytle Butte, MD   20 mg at 10/21/14 1154  . ferrous fumarate (HEMOCYTE - 106 mg FE) tablet 106 mg of iron  1 tablet Oral Daily Lytle Butte, MD      . fluPHENAZine (PROLIXIN) tablet 2.5 mg  2.5 mg Oral Daily Lytle Butte, MD   2.5 mg at 10/21/14 1157  . heparin injection 5,000 Units  5,000 Units Subcutaneous 3 times per day Lytle Butte, MD   5,000 Units at 10/20/14 2254  . hydrALAZINE (APRESOLINE) injection 10 mg  10 mg Intravenous Q4H PRN Lytle Butte, MD   10 mg at 10/20/14 2329  . ipratropium-albuterol (DUONEB) 0.5-2.5 (3) MG/3ML nebulizer solution 3 mL  3 mL Nebulization Q4H Lytle Butte, MD   3 mL at 10/21/14 1525  . morphine 2 MG/ML injection 2 mg  2 mg Intravenous Q4H PRN Lytle Butte, MD      . ondansetron [facility]) tablet 4 mg  4 mg Oral Q6H PRN Lytle Butte, MD       Or  . ondansetron [facility]) injection 4 mg  4 mg Intravenous Q6H PRN Lytle Butte, MD      . pantoprazole (PROTONIX) EC tablet 40 mg  40 mg Oral Daily Lytle Butte, MD   40 mg at 10/21/14 1154  . sodium chloride 0.9 % injection 3 mL  3 mL Intravenous  Q12H Lytle Butte, MD   3 mL at 10/20/14 2200  . trihexyphenidyl (ARTANE) tablet 5 mg  5 mg Oral TID WC Lytle Butte, MD   5 mg at 10/21/14 1155    Past Medical History  Diagnosis Date  . ESRD (end stage renal disease)   . Hypertension   . Schizophrenia     Past Surgical History  Procedure Laterality Date  . Repair fx left lower leg      yrs ago (age 64)    Social History History  Substance Use Topics  . Smoking status: Current Every Day Smoker  . Smokeless tobacco: Not on file     Comment: 10 cigars a day  . Alcohol Use: No   lives at home. Has smoked for many years.  Family History Family History  Problem Relation Age of Onset  . Diabetes Neg Hx    no family history of bleeding disorders, clotting problems, or renal failure and to his knowledge  Allergies  Allergen Reactions  . Thorazine [Chlorpromazine]      REVIEW OF SYSTEMS (Negative  unless checked)  Constitutional: [] Weight loss  [] Fever  [] Chills Cardiac: [] Chest pain   [] Chest pressure   [] Palpitations   [x] Shortness of breath when laying flat   [] Shortness of breath at rest   [x] Shortness of breath with exertion. Vascular:  [] Pain in legs with walking   [] Pain in legs at rest   [] Pain in legs when laying flat   [] Claudication   [] Pain in feet when walking  [] Pain in feet at rest  [] Pain in feet when laying flat   [] History of DVT   [] Phlebitis   [x] Swelling in legs   [] Varicose veins   [] Non-healing ulcers Pulmonary:   [] Uses home oxygen   [x] Productive cough   [] Hemoptysis   [x] Wheeze  [] COPD   [] Asthma Neurologic:  [] Dizziness  [] Blackouts   [] Seizures   [] History of stroke   [] History of TIA  [] Aphasia   [] Temporary blindness   [] Dysphagia   [] Weakness or numbness in arms   [] Weakness or numbness in legs Musculoskeletal:  [] Arthritis   [] Joint swelling   [] Joint pain   [] Low back pain Hematologic:  [] Easy bruising  [] Easy bleeding   [] Hypercoagulable state   [] Anemic  [x] Hepatitis Gastrointestinal:  [] Blood in stool   [] Vomiting blood  [] Gastroesophageal reflux/heartburn   [] Difficulty swallowing. Genitourinary:  [x] Chronic kidney disease   [x] Difficult urination  [] Frequent urination  [] Burning with urination   [] Blood in urine Skin:  [] Rashes   [] Ulcers   [] Wounds Psychological:  [] History of anxiety   []  History of major depression.  Physical Examination  Filed Vitals:   10/21/14 0945 10/21/14 1045 10/21/14 1329 10/21/14 1528  BP: 148/79 155/78 150/81 147/84  Pulse: 84 88 84 82  Temp: 98.2 F (36.8 C) 98.2 F (36.8 C) 98 F (36.7 C) 97.9 F (36.6 C)  TempSrc: Oral Oral Oral Oral  Resp: 16 17 17 17   Height:      Weight:      SpO2: 98% 99% 98% 99%   Body mass index is 27.84 kg/(m^2).  Head: [place]/AT, No temporalis wasting. Prominent temp pulse not noted. Ear/Nose/Throat: Hearing grossly intact, nares w/o erythema or drainage, oropharynx w/o  Erythema/Exudate, Mallampati score: 2.  Dentition poor Eyes: PERRLA, EOMI.  Neck: Supple, no nuchal rigidity.  No bruit or JVD.  Pulmonary:  Good air movement, clear to auscultation bilaterally.  Cardiac: RRR, normal S1, S2, no Murmurs, rubs or gallops. Vascular:  No current access for hemodialysis Vessel Right Left  Radial Palpable Palpable  Ulnar Palpable Palpable  Brachial Palpable Palpable  Carotid Palpable, without bruit Palpable, without bruit  Aorta Not palpable N/A  Femoral Palpable Palpable  Popliteal Palpable Palpable  PT Palpable Palpable  DP Palpable Palpable   Gastrointestinal: soft, non-tender/non-distended. No guarding/reflex. No masses, surgical incisions, or scars. Musculoskeletal: M/S 5/5 throughout.  Extremities without ischemic changes.  No deformity or atrophy. Mild lower extremity edema. Neurologic: CN 2-12 intact. Pain and light touch intact in extremities.  Symmetrical.  Speech is fluent. Motor exam as listed above. Psychiatric: Judgment intact, Mood & affect appropriate for pt's clinical situation. Dermatologic: No rashes or ulcers noted.  No cellulitis or open wounds. Lymph : No Cervical, Axillary, or Inguinal lymphadenopathy.      CBC Lab Results  Component Value Date   WBC 6.6 10/21/2014   HGB 5.2* 10/21/2014   HCT 15.9* 10/21/2014   MCV 94.0 10/21/2014   PLT 156 10/21/2014    BMET    Component Value Date/Time   NA 138 10/21/2014 0423   K 4.9 10/21/2014 0423   CL 113* 10/21/2014 0423   CO2 13* 10/21/2014 0423   GLUCOSE 109* 10/21/2014 0423   BUN 100* 10/21/2014 0423   CREATININE 12.07* 10/21/2014 0423   CREATININE 3.47* 03/23/2013 1501   CALCIUM 7.9* 10/21/2014 0423   GFRNONAA 4* 10/21/2014 0423   GFRAA 5* 10/21/2014 0423   Estimated Creatinine Clearance: 7.6 mL/min (by C-G formula based on Cr of 12.07).  COAG Lab Results  Component Value Date   INR 0.97 03/23/2013      Assessment/Plan 1. Acute on chronic renal failure  likely representing end-stage renal disease. He needs to initiate dialysis at this time. Have discussed with the family the risks and benefits of PermCath placement. We will plan to place his PermCath tomorrow in special procedures. Nothing by mouth after midnight and consent obtained. 2. Right renal artery stenosis. I don't see any obvious tests documenting this, but will discuss with nephrology. If this is indeed the case once he has stabilized with several days of dialysis, we could consider angiography with possible intervention for revascularization. It may not improve his renal function and he may still need dialysis, but this is certainly a cause of ischemic nephropathy and severe poorly controlled hypertension leading to dialysis. Unfortunately, we do not have renal artery duplex available in our Hospital and so the best test for this here would be renal angiogram. 3. Tobacco dependence. Smoking cessation recommended. Progression of atherosclerotic vascular disease worsened with tobacco use. 4. Hypertension. Suboptimal control. It is certainly possible that renal artery stenosis could be a contributing factor.   DEW,JASON, MD  10/21/2014 3:38 PM    Level 4 consult

## 2014-10-21 NOTE — Progress Notes (Addendum)
[facility] [place] NAME: Todd Brown    MR#:  [id number]  DATE OF BIRTH:  [birth year]  SUBJECTIVE:  CHIEF COMPLAINT: Patient sent in from [facility] ER secondary to acute on chronic renal failure and anemia. Baseline creatinine of 3 now creatinine around 12. Patient continues to make urine at this time. Has been feeling weak. Has myoclonic jerks on exam. Follows with Dr. Holley Raring as an outpatient. Possibility of dialysis has been explained to patient. Patient does have underlying schizophrenia.  REVIEW OF SYSTEMS:  Review of Systems  Constitutional: Positive for malaise/fatigue. Negative for fever and chills.  Respiratory: Negative for cough, shortness of breath and wheezing.   Cardiovascular: Negative for chest pain and palpitations.  Gastrointestinal: Negative for nausea, vomiting, abdominal pain, diarrhea and constipation.  Genitourinary: Negative for dysuria.  Neurological: Positive for tremors. Negative for dizziness, seizures and headaches.  Psychiatric/Behavioral:       Has schizophrenia    DRUG ALLERGIES:   Allergies  Allergen Reactions  . Thorazine [Chlorpromazine]     VITALS:  Blood pressure 155/78, pulse 88, temperature 98.2 F (36.8 C), temperature source Oral, resp. rate 17, height 5\' 9"  (1.753 m), weight 85.548 kg (188 lb 9.6 oz), SpO2 99 %.  PHYSICAL EXAMINATION:  Physical Exam  GENERAL:  54 y.o.-year-old patient lying in the bed with no acute distress.  EYES: Pupils equal, round, reactive to light and accommodation. No scleral icterus. Extraocular muscles intact.  HEENT: Head atraumatic, normocephalic. Oropharynx and nasopharynx clear.  NECK:  Supple, no jugular venous distention. No thyroid enlargement, no tenderness.  LUNGS: Normal breath sounds bilaterally, no wheezing, rales,rhonchi or crepitation. No use of accessory muscles of respiration.  CARDIOVASCULAR: S1, S2 normal. Systolic murmur Present, no  rubs, or gallops.  ABDOMEN: Soft, nontender, nondistended. Bowel sounds present. No organomegaly or mass.  EXTREMITIES: 3+ pedal edema, no cyanosis, or clubbing.  NEUROLOGIC: Cranial nerves II through XII are intact. Muscle strength 5/5 in all extremities. Sensation intact. Gait not checked.  PSYCHIATRIC: The patient is alert and oriented x 3. Blunt affect. SKIN: No obvious rash, lesion, or ulcer.    LABORATORY PANEL:   CBC  Recent Labs Lab 10/21/14 0423  WBC 6.6  HGB 5.2*  HCT 15.9*  PLT 156   ------------------------------------------------------------------------------------------------------------------  Chemistries   Recent Labs Lab 10/21/14 0423  NA 138  K 4.9  CL 113*  CO2 13*  GLUCOSE 109*  BUN 100*  CREATININE 12.07*  CALCIUM 7.9*   ------------------------------------------------------------------------------------------------------------------  Cardiac Enzymes No results for input(s): TROPONINI in the last 168 hours. ------------------------------------------------------------------------------------------------------------------  RADIOLOGY:  Dg Chest Port 1 View  10/21/2014   CLINICAL DATA:  Shortness of breath.  EXAM: PORTABLE CHEST - 1 VIEW  COMPARISON:  None.  FINDINGS: Enlarged cardiac silhouette. Mildly prominent pulmonary vasculature and interstitial markings. No pleural fluid. Unremarkable bones.  IMPRESSION: 1. Mild cardiomegaly and mild pulmonary vascular congestion. 2. Mild chronic interstitial lung disease.   Electronically Signed   By: Claudie Revering M.D.   On: 10/21/2014 09:21    EKG:  No orders found for this or any previous visit.  ASSESSMENT AND PLAN:   54 year old male with Past medical history significant for CK D with baseline creatinine of 3, hypertension, schizophrenia, hepatitis C presents to the hospital from [facility] ER secondary to acute on chronic kidney disease.  #1 Acute kidney injury on CKD-baseline creatinine of 3 now  creatinine greater than 12. Patient is still making urine.  No hyperkalemia. will need dialysis. No urgent indication. We'll wait for nephrology to evaluate him. Renal ultrasound is pending. Serum 1 dose of Lasix on admission.  #2 acute on chronic anemia-patient has likely anemia of chronic disease from his CKD. However her hemoglobin is at 5.2 today. Very symptomatic with weakness and also affecting his kidneys. 2 units of packed RBC has already been ordered. Recheck hemoglobin after transfusion. Baseline hemoglobin not available at this time.  #3 hypertension-continue atenolol and Norvasc.  #4 pneumonia-no evidence of any pneumonia on chest x-ray, no fevers noted. Breathing is comfortable. Blood cultures were not drawn on admission. Pneumonia has been ruled out. Discontinue antibiotics.   #5 schizophrenia-stable at this time. Continue home medications. Patient on Prolixin, Artane and Celexa and Tegretol.  #6 DVT prophylaxis-on subcutaneous heparin.   All the records are reviewed and case discussed with Care Management/Social Workerr. Management plans discussed with the patient, family and they are in agreement.  CODE STATUS: Full code  TOTAL TIME TAKING CARE OF THIS PATIENT: 40 minutes.   POSSIBLE D/C IN 3 DAYS, DEPENDING ON CLINICAL CONDITION.   Gladstone Lighter M.D on 10/21/2014 at 11:55 AM  Between 7am to 6pm - Pager - [phone]  After 6pm go to [email or web address] - password [email or web address] [facility]  Office  [phone]  CC: Primary care physician; Rosita Fire, MD

## 2014-10-21 NOTE — H&P (Signed)
[place] [facility] [place] NAME: Todd Brown    MR#:  [id number]  DATE OF BIRTH:  12/28/1960   DATE OF ADMISSION:  10/20/2014  PRIMARY CARE PHYSICIAN: Rosita Fire, MD   REQUESTING/REFERRING PHYSICIAN: [facility] emergency department  CHIEF COMPLAINT:  No chief complaint on file.  shortness of breath, leg swelling  HISTORY OF PRESENT ILLNESS:  Todd Brown  is a 54 y.o. male with a known history of chronic kidney disease last some basic creatinine around 3, essential hypertension, hepatitis C, schizophrenia who originally presented to [facility] emergency department with shortness of breath. Additional history obtained from patient's mother. They described orthopnea, lower extremity edema worsening over the last 1 month or so duration. 2 day duration of cough, nonproductive without fevers or chills. One day duration of shortness of breath the patient stated "I couldn't catch my air" with the above symptoms presents to the hospital for further workup and evaluation. Upon arrival to their emergency department he was noted to be markedly hypertensive systolic blood pressure around 200, diagnosed with a right lower lobe pneumonia received ceftriaxone/azithromycin, and despite this his oxygen saturation and temperature were within normal limits. The case was discussed with 1 of our daytime hospitalists who accepted the patient for transfer.  PAST MEDICAL HISTORY:   Past Medical History  Diagnosis Date  . ESRD (end stage renal disease)   . Hypertension   . Schizophrenia     PAST SURGICAL HISTORY:   Past Surgical History  Procedure Laterality Date  . Repair fx left lower leg      yrs ago (age 31)    SOCIAL HISTORY:   History  Substance Use Topics  . Smoking status: Current Every Day Smoker  . Smokeless tobacco: Not on file     Comment: 10 cigars a day  . Alcohol Use: No    FAMILY HISTORY:   Family History  Problem  Relation Age of Onset  . Diabetes Neg Hx     DRUG ALLERGIES:   Allergies  Allergen Reactions  . Thorazine [Chlorpromazine]     REVIEW OF SYSTEMS:  REVIEW OF SYSTEMS:  CONSTITUTIONAL: Denies fevers, chills, positive for fatigue, weakness.  EYES: Denies blurred vision, double vision, or eye pain.  EARS, NOSE, THROAT: Denies tinnitus, ear pain, hearing loss.  RESPIRATORY: Positive for cough, shortness of breath, denies wheezing  CARDIOVASCULAR: Denies chest pain, palpitations, positive edema. , Orthopnea GASTROINTESTINAL: Denies nausea, vomiting, diarrhea, abdominal pain.  GENITOURINARY: Denies dysuria, hematuria.  ENDOCRINE: Denies nocturia or thyroid problems. HEMATOLOGIC AND LYMPHATIC: Denies easy bruising or bleeding.  SKIN: Denies rash or lesions.  MUSCULOSKELETAL: Denies pain in neck, back, shoulder, knees, hips, or further arthritic symptoms.  NEUROLOGIC: Denies paralysis, paresthesias.  PSYCHIATRIC: Denies anxiety or depressive symptoms. Otherwise full review of systems performed by me is negative.   MEDICATIONS AT HOME:   Prior to Admission medications   Medication Sig Start Date End Date Taking? Authorizing Provider  amLODipine (NORVASC) 5 MG tablet Take 5 mg by mouth daily.    Historical Provider, MD  atenolol (TENORMIN) 25 MG tablet Take 25 mg by mouth 2 (two) times daily.     Historical Provider, MD  carbamazepine (TEGRETOL) 200 MG tablet Take 200 mg by mouth 2 (two) times daily. Taken 2 1/2 tabs (54m ) twice a day    Historical Provider, MD  cholecalciferol (VITAMIN D) 1000 UNITS tablet Take 1,000 Units by mouth daily.    Historical Provider, MD  citalopram (CELEXA) 20 MG tablet Take 20 mg by mouth daily.    Historical Provider, MD  Fe Fum-FA-B Cmp-C-Zn-Mg-Mn-Cu (FERROCITE PLUS PO) Take by mouth daily.    Historical Provider, MD  fluPHENAZine (PROLIXIN) 2.5 MG tablet Take 2.5 mg by mouth daily.    Historical Provider, MD  fluPHENAZine decanoate (PROLIXIN) 25 MG/ML  injection Inject 37.5 mg into the muscle every 14 (fourteen) days.    Historical Provider, MD  omeprazole (PRILOSEC) 20 MG capsule Take 20 mg by mouth daily.    Historical Provider, MD  peg 3350 powder (MOVIPREP) 100 G SOLR Take 1 kit (200 g total) by mouth as directed. 04/03/14   Danie Binder, MD  traMADol (ULTRAM) 50 MG tablet Take 50 mg by mouth every 6 (six) hours as needed for pain.    Historical Provider, MD  trihexyphenidyl (ARTANE) 5 MG tablet Take 5 mg by mouth 3 (three) times daily with meals.    Historical Provider, MD      VITAL SIGNS:  Blood pressure 198/112, pulse 81, temperature 98.7 F (37.1 C), temperature source Oral, resp. rate 20, weight 188 lb 9.6 oz (85.548 kg).  PHYSICAL EXAMINATION:  VITAL SIGNS: Filed Vitals:   10/20/14 2155  BP: 198/112  Pulse: 81  Temp: 98.7 F (37.1 C)  Resp: 20   GENERAL:54 y.o.male currently in no acute distress.  HEAD: Normocephalic, atraumatic.  EYES: Pupils equal, round, reactive to light. Extraocular muscles intact. No scleral icterus.  MOUTH: Moist mucosal membrane. Dentition intact. No abscess noted.  EAR, NOSE, THROAT: Clear without exudates. No external lesions.  NECK: Supple. No thyromegaly. No nodules. No JVD.  PULMONARY: Breath sounds diminished without frank wheeze rails or rhonci. No use of accessory muscles, Good respiratory effort. good air entry bilaterally CHEST: Nontender to palpation.  CARDIOVASCULAR: S1 and S2. Regular rate and rhythm. No murmurs, rubs, or gallops. 3+ edema to thigh. Pedal pulses 2+ bilaterally.  GASTROINTESTINAL: Soft, nontender, nondistended. No masses. Positive bowel sounds. No hepatosplenomegaly.  MUSCULOSKELETAL: No swelling, clubbing, or edema. Range of motion full in all extremities.  NEUROLOGIC: Cranial nerves II through XII are intact. No gross focal neurological deficits. Sensation intact. Reflexes intact.  SKIN: No ulceration, lesions, rashes, or cyanosis. Skin warm and dry. Turgor  intact.  PSYCHIATRIC: Mood, affect within normal limits. The patient is awake, alert and oriented x 3. Insight, judgment intact.    LABORATORY PANEL:   CBC No results for input(s): WBC, HGB, HCT, PLT in the last 168 hours.  WBC 8, hemoglobin 5.6, hematocrit 16.4, platelets 195 ------------------------------------------------------------------------------------------------------------------  Chemistries  No results for input(s): NA, K, CL, CO2, GLUCOSE, BUN, CREATININE, CALCIUM, MG, AST, ALT, ALKPHOS, BILITOT in the last 168 hours.  Invalid input(s): GFRCGP  Sodium 141, potassium 5, chloride 112, CO2 12, BUN 90, creatinine 11.9, anion gap 17 ------------------------------------------------------------------------------------------------------------------  Cardiac Enzymes No results for input(s): TROPONINI in the last 168 hours. ------------------------------------------------------------------------------------------------------------------  RADIOLOGY:  No results found.  Chest x-ray: Right lower lobe pneumonia  EKG:  No orders found for this or any previous visit.  LVH, without significant ST-T abnormality  IMPRESSION AND PLAN:   54 year old African gentleman history of chronic kidney disease baseline creatinine around 3, hypertension essential, schizophrenia presenting with shortness of breath and edema.  1. Acute kidney injury on chronic kidney disease: Consult nephrology for likely dialysis in near future, attempt diuresis with Lasix high-dose, avoid further nephrotoxic agents, 2. Hypertensive urgency: Restart home medications, add when necessary hydralazine 3. Community acquired pneumonia: Diagnosed prior  facility continue antibiotic coverage ceftriaxone/azithromycin however, repeat chest x-ray 4. Chronic anemia: Repeat CBC, type and cross May need blood transfusion 5. Schizophrenia: Continue home medications 6. Venous thromboembolism prophylactic: Heparin  subcutaneous     All the records are reviewed and case discussed with ED provider. Management plans discussed with the patient, family and they are in agreement.  CODE STATUS: Full  TOTAL TIME TAKING CARE OF THIS PATIENT: 45 minutes.    Hower,  Karenann Cai.D on 10/21/2014 at 12:04 AM  Between 7am to 6pm - Pager - [phone]  After 6pm: House Pager: - [phone]  [facility]  Office  [phone]  CC: Primary care physician; Rosita Fire, MD

## 2014-10-22 ENCOUNTER — Encounter
Disposition: A | Discharge status: Home / Self Care | Payer: Medicaid Other | Source: Other Acute Inpatient Hospital | Attending: Internal Medicine

## 2014-10-22 HISTORY — PX: PERIPHERAL VASCULAR CATHETERIZATION: SHX172C

## 2014-10-22 LAB — CBC
HCT: 22.3 % — ABNORMAL LOW (ref 40.0–52.0)
Hemoglobin: 7.6 g/dL — ABNORMAL LOW (ref 13.0–18.0)
MCH: 31.4 pg (ref 26.0–34.0)
MCHC: 34.2 g/dL (ref 32.0–36.0)
MCV: 91.8 fL (ref 80.0–100.0)
Platelets: 179 10*3/uL (ref 150–440)
RBC: 2.43 MIL/uL — AB (ref 4.40–5.90)
RDW: 16.2 % — ABNORMAL HIGH (ref 11.5–14.5)
WBC: 10 10*3/uL (ref 3.8–10.6)

## 2014-10-22 LAB — RENAL FUNCTION PANEL
ALBUMIN: 3.4 g/dL — AB (ref 3.5–5.0)
ANION GAP: 10 (ref 5–15)
BUN: 98 mg/dL — ABNORMAL HIGH (ref 6–20)
CALCIUM: 8.3 mg/dL — AB (ref 8.9–10.3)
CHLORIDE: 116 mmol/L — AB (ref 101–111)
CO2: 13 mmol/L — AB (ref 22–32)
CREATININE: 11.88 mg/dL — AB (ref 0.61–1.24)
GFR calc Af Amer: 5 mL/min — ABNORMAL LOW (ref >60)
GFR calc non Af Amer: 4 mL/min — ABNORMAL LOW (ref >60)
Glucose, Bld: 97 mg/dL (ref 65–99)
Phosphorus: 9.6 mg/dL — ABNORMAL HIGH (ref 2.5–4.6)
Potassium: 5.1 mmol/L (ref 3.5–5.1)
SODIUM: 139 mmol/L (ref 135–145)

## 2014-10-22 LAB — URINALYSIS COMPLETE WITH MICROSCOPIC (ARMC ONLY)
BACTERIA UA: NONE SEEN
Bilirubin Urine: NEGATIVE
Glucose, UA: 50 mg/dL — AB
Ketones, ur: NEGATIVE mg/dL
LEUKOCYTES UA: NEGATIVE
Nitrite: NEGATIVE
PH: 6 (ref 5.0–8.0)
Protein, ur: 100 mg/dL — AB
SQUAMOUS EPITHELIAL / LPF: NONE SEEN
Specific Gravity, Urine: 1.008 (ref 1.005–1.030)

## 2014-10-22 LAB — PROTEIN / CREATININE RATIO, URINE
Creatinine, Urine: 41 mg/dL
Protein Creatinine Ratio: 3.44 mg/mg{Cre} — ABNORMAL HIGH (ref 0.00–0.15)
Total Protein, Urine: 141 mg/dL

## 2014-10-22 LAB — PHOSPHORUS: Phosphorus: 9.6 mg/dL — ABNORMAL HIGH (ref 2.5–4.6)

## 2014-10-22 LAB — ALBUMIN: ALBUMIN: 3.1 g/dL — AB (ref 3.5–5.0)

## 2014-10-22 SURGERY — DIALYSIS/PERMA CATHETER INSERTION
Anesthesia: Moderate Sedation

## 2014-10-22 MED ORDER — LIDOCAINE-EPINEPHRINE (PF) 1 %-1:200000 IJ SOLN
INTRAMUSCULAR | Status: AC
  Filled 2014-10-22: qty 30

## 2014-10-22 MED ORDER — FENTANYL CITRATE (PF) 100 MCG/2ML IJ SOLN
INTRAMUSCULAR | Status: DC | PRN
  Administered 2014-10-22: 50 ug via INTRAVENOUS

## 2014-10-22 MED ORDER — CEFAZOLIN SODIUM 1-5 GM-% IV SOLN
1.0000 g | Freq: Once | INTRAVENOUS | Status: DC
  Administered 2014-10-22: 1 g via INTRAVENOUS

## 2014-10-22 MED ORDER — MIDAZOLAM HCL 5 MG/5ML IJ SOLN
INTRAMUSCULAR | Status: AC
  Filled 2014-10-22: qty 5

## 2014-10-22 MED ORDER — MIDAZOLAM HCL 2 MG/2ML IJ SOLN
INTRAMUSCULAR | Status: DC | PRN
  Administered 2014-10-22: 2 mg via INTRAVENOUS

## 2014-10-22 MED ORDER — HYDRALAZINE HCL 25 MG PO TABS
25.0000 mg | ORAL_TABLET | Freq: Three times a day (TID) | ORAL | Status: DC
  Administered 2014-10-22 – 2014-10-23 (×4): 25 mg via ORAL
  Filled 2014-10-22 (×4): qty 1

## 2014-10-22 MED ORDER — HEPARIN SODIUM (PORCINE) 10000 UNIT/ML IJ SOLN
INTRAMUSCULAR | Status: AC
  Filled 2014-10-22: qty 1

## 2014-10-22 MED ORDER — FENTANYL CITRATE (PF) 100 MCG/2ML IJ SOLN
INTRAMUSCULAR | Status: AC
  Filled 2014-10-22: qty 2

## 2014-10-22 MED ORDER — SODIUM CHLORIDE 0.9 % IV SOLN
INTRAVENOUS | Status: DC
  Administered 2014-10-22: 16:00:00 via INTRAVENOUS

## 2014-10-22 MED ORDER — IPRATROPIUM-ALBUTEROL 0.5-2.5 (3) MG/3ML IN SOLN
RESPIRATORY_TRACT | Status: AC
  Filled 2014-10-22: qty 3

## 2014-10-22 MED ORDER — AMLODIPINE BESYLATE 10 MG PO TABS: 10.0000 mg | ORAL_TABLET | Freq: Every day | ORAL | Status: DC

## 2014-10-22 MED ORDER — HEPARIN (PORCINE) IN NACL 2-0.9 UNIT/ML-% IJ SOLN
INTRAMUSCULAR | Status: AC
  Filled 2014-10-22: qty 500

## 2014-10-22 MED ORDER — IRBESARTAN 150 MG PO TABS
150.0000 mg | ORAL_TABLET | Freq: Every day | ORAL | Status: DC
  Administered 2014-10-22: 150 mg via ORAL
  Filled 2014-10-22 (×4): qty 1

## 2014-10-22 SURGICAL SUPPLY — 3 items
CATH CANNON HEMO 15FR 19 (HEMODIALYSIS SUPPLIES) ×3 IMPLANT
PACK ANGIOGRAPHY (CUSTOM PROCEDURE TRAY) ×3 IMPLANT
TOWEL OR 17X26 4PK STRL BLUE (TOWEL DISPOSABLE) ×3 IMPLANT

## 2014-10-22 NOTE — Op Note (Signed)
OPERATIVE NOTE    Todd DIAGNOSIS: 1. ESRD 2.  HTN 3.  Schizophrenia  POST-OPERATIVE DIAGNOSIS: same as above  PROCEDURE: 1. Ultrasound guidance for vascular access to the right internal jugular vein 2. Fluoroscopic guidance for placement of catheter 3. Placement of a 19 cm tip to cuff tunneled hemodialysis catheter via the right internal jugular vein  SURGEON: Leotis Pain, MD  ANESTHESIA:  Local/MCS  ESTIMATED BLOOD LOSS: 25 cc  FINDING(S): 1.  Patent right internal jugular vein  SPECIMEN(S):  None  INDICATIONS:   [name] is a 54 y.o. male who presents with progressive decline of his renal function.  He now has a BUN approaching 100 and Cr >10, and is felt to be ESRD.  The patient needs long term dialysis access for their ESRD, and a Permcath is necessary.  Risks and benefits are discussed and informed consent is obtained.    DESCRIPTION: After obtaining full informed written consent, the patient was brought back to the vascular suited. The patient's right neck and chest were sterilely prepped and draped in a sterile surgical field was created.  The right internal jugular vein was visualized with ultrasound and found to be patent. It was then accessed under direct ultrasound guidance and a permanent image was recorded. A wire was placed. After skin nick and dilatation, the peel-away sheath was placed over the wire. I then turned my attention to an area under the clavicle. Approximately 1-2 fingerbreadths below the clavicle a small counterincision was created and tunneled from the subclavicular incision to the access site. Using fluoroscopic guidance, a 19 centimeter tip to cuff tunneled hemodialysis catheter was selected, and tunneled from the subclavicular incision to the access site. It was then placed through the peel-away sheath and the peel-away sheath was removed. Using fluoroscopic guidance the catheter tips were parked in the right atrium. The appropriate distal  connectors were placed. It withdrew blood well and flushed easily with heparinized saline and a concentrated heparin solution was then placed. It was secured to the chest wall with 2 Prolene sutures. The access incision was closed single 4-0 Monocryl. A 4-0 Monocryl pursestring suture was placed around the exit site. Sterile dressings were placed. The patient tolerated the procedure well and was taken to the recovery room in stable condition.  COMPLICATIONS: None  CONDITION: Stable  [name]  10/22/2014, 5:16 PM

## 2014-10-22 NOTE — Progress Notes (Signed)
HD tx start 

## 2014-10-22 NOTE — Progress Notes (Signed)
   10/22/14 0900  Clinical Encounter Type  Visited With Patient  Visit Type Spiritual support  Referral From Other (Comment)  Spiritual Encounters  Spiritual Needs Emotional  Stress Factors  Patient Stress Factors None identified  Family Stress Factors Health changes  Advance Directives (For Healthcare)  Does patient have an advance directive? No  Would patient like information on creating an advanced directive? No - patient declined information  Taylor with patient and offered prayer and support.

## 2014-10-22 NOTE — Progress Notes (Addendum)
Initial Nutrition Assessment  DOCUMENTATION CODES:     INTERVENTION:   (Nutrition diet education: Will follow-up with diet education once back on oral diet and poc known regarding long term dialysis)  NUTRITION DIAGNOSIS:  Inadequate oral intake related to other (see comment) (procedure pending) as evidenced by NPO status.    GOAL:   (Goal would be for diet to be progressed once procedure completed )   MONITOR:   (Electrolyte and renal profile, energy intake)  REASON FOR ASSESSMENT:  Other (Comment) (diagnosis)    ASSESSMENT:  Pt admitted with acute on chronic renal failure. Planning permacath placement today to start HD. Pt with AMS and shortness of breath.  Past Medical History  Diagnosis Date  . ESRD (end stage renal disease)   . Hypertension   . Schizophrenia     Pt reports good appetite prior to admission.  Wanting something to eat at this time.  Reviewed NPO status.  Medications: vit d, ferrous furmate  Electrolyte and Renal Profile:    Recent Labs Lab 10/21/14 0423 10/21/14 1928 10/22/14 0504  BUN 100* 96* 98*  CREATININE 12.07* 11.69* 11.88*  NA 138 138 139  K 4.9 5.2* 5.1  PHOS  --   --  9.6*     Height:  Ht Readings from Last 1 Encounters:  10/20/14 5\' 9"  (1.753 m)    Weight: Pt reports stable weight prior to admission  Wt Readings from Last 1 Encounters:  10/20/14 188 lb 9.6 oz (85.548 kg)      Wt Readings from Last 10 Encounters:  10/20/14 188 lb 9.6 oz (85.548 kg)  09/26/13 177 lb 8 oz (80.513 kg)  03/23/13 175 lb 12.8 oz (79.742 kg)    BMI:  Body mass index is 27.84 kg/(m^2).    Skin:  Reviewed, no issues  Diet Order:  Diet NPO time specified Except for: Sips with Meds  EDUCATION NEEDS:  Education needs no appropriate at this time   Intake/Output Summary (Last 24 hours) at 10/22/14 1401 Last data filed at 10/22/14 0804  Gross per 24 hour  Intake    645 ml  Output   1950 ml  Net  -1305 ml    Last BM:   5/21  LOW Care Level Thelonious Kauffmann B. Zenia Resides, [place], [place] (pager)

## 2014-10-22 NOTE — Progress Notes (Signed)
Post hd tx 

## 2014-10-22 NOTE — Progress Notes (Signed)
PT Cancellation Note  Patient Details Name: Todd Brown MRN: [id number] DOB: 01/19/1961   Cancelled Treatment:    Reason Eval/Treat Not Completed: Patient not medically ready (see PT cancellation note for further details) Pt with elevated BP at 210/118 at rest. Not appropriate for evaluation at this time. Per RN pt also has procedure and HD this afternoon. Will complete evaluation next date if medically stable.  [name], PT, DPT [phone]   [name] 10/22/2014, 2:18 PM

## 2014-10-22 NOTE — Progress Notes (Signed)
HD tx completed.

## 2014-10-22 NOTE — Clinical Social Work Note (Signed)
Clinical Social Work Assessment  Patient Details  Name: Todd Brown MRN: [id number] Date of Birth: [birth year]  Date of referral:  10/22/14               Reason for consult:  Mental Health Concerns, Facility Placement                Permission sought to share information with:    Permission granted to share information::     Name::        Agency::     Relationship::     Contact Information:     Housing/Transportation Living arrangements for the past 2 months:   (house) Source of Information:  Parent Patient Interpreter Needed:  None Criminal Activity/Legal Involvement Pertinent to Current Situation/Hospitalization:  No - Comment as needed Significant Relationships:  Parents Lives with:    Do you feel safe going back to the place where you live?  Yes Need for family participation in patient care:  Yes (Comment)  Care giving concerns:  Has been living alone  Facilities manager / plan:  CSW informed by charge nurse that patient's mother was on the phone and had password. CSW received call from patient's mother: Todd Brown: cell: [phone] and [phone] stating that patient would need placement when he left the hospital. Patient's mother states that patient had been in a nursing facility in [place] for a long time (could not tell CSW how long) and that he had been living on his own for 6 months. Patient's mother reports patient has schizophrenia and is followed by an ACT team and has not been doing well at home. Patient's mother states she went to pick patient up Friday to bring him home with her to look after him but then ended up having to send patient to the [facility]. From there, patient was transferred to [facility] and is now being evaluated for acute vs ESRD.   Patient currently resting and is to receive permcath to initiate dialysis. CSW has requested physical therapy assess patient but they could not work with patient due to his elevated BP. If patient  requires placement and is in agreement with placement in a nursing facility, patient is medicaid only and will have to possibly be sent to another county. CSW will continue to follow and will follow up with patient.   Employment status:  Disabled (Comment on whether or not currently receiving Disability) Insurance information:  Medicaid In [place] PT Recommendations:  Not assessed at this time Information / Referral to community resources:     Patient/Family's Response to care:  Patient's mother concerned for patient's well being.   Patient/Family's Understanding of and Emotional Response to Diagnosis, Current Treatment, and Prognosis:  Patient's mother understands patient requires supervision for his care.   Emotional Assessment Appearance:    Attitude/Demeanor/Rapport:    Affect (typically observed):    Orientation:    Alcohol / Substance use:  Not Applicable Psych involvement (Current and /or in the community):  No (Comment)  Discharge Needs  Concerns to be addressed:  Mental Health Concerns, Other (Comment Required (possible new outpt dialysis, possible assistant ADL's) Readmission within the last 30 days:  No Current discharge risk:  Psychiatric Illness, Other (possible new outpatient HD) Barriers to Discharge:   (concern for ability to care for himself)   Shela Leff, LCSW 10/22/2014, 2:57 PM

## 2014-10-22 NOTE — Progress Notes (Signed)
[place] at [place] NAME: Todd Brown    MR#:  [id number]  DATE OF BIRTH:  January 14, 1961  SUBJECTIVE:  CHIEF COMPLAINT: Patient has underlying schizophrenia. Due for permacath placement and dialysis today. Still has some any questions about why his kidneys are bad and how long he needs dialysis. Mother not at bedside today.  REVIEW OF SYSTEMS:  Review of Systems  Constitutional: Positive for malaise/fatigue. Negative for fever and chills.  Respiratory: Positive for shortness of breath. Negative for cough and wheezing.   Cardiovascular: Positive for leg swelling. Negative for chest pain and palpitations.  Gastrointestinal: Negative for nausea, vomiting, abdominal pain, diarrhea and constipation.  Genitourinary: Negative for dysuria.  Neurological: Negative for dizziness, seizures and headaches.    DRUG ALLERGIES:   Allergies  Allergen Reactions  . Thorazine [Chlorpromazine]     VITALS:  Blood pressure 210/118, pulse 73, temperature 97.3 F (36.3 C), temperature source Oral, resp. rate 20, height 5\' 9"  (1.753 m), weight 85.548 kg (188 lb 9.6 oz), SpO2 100 %.  PHYSICAL EXAMINATION:  Physical Exam  GENERAL:  54 y.o.-year-old patient lying in the bed with no acute distress.  EYES: Pupils equal, round, reactive to light and accommodation. No scleral icterus. Extraocular muscles intact.  HEENT: Head atraumatic, normocephalic. Oropharynx and nasopharynx clear.  NECK:  Supple, no jugular venous distention. No thyroid enlargement, no tenderness.  LUNGS: Normal breath sounds bilaterally, no wheezing, ,rhonchi . No use of accessory muscles of respiration. Decreased basilar breath sounds and also Rales present bilaterally CARDIOVASCULAR: S1, S2 normal. Systolic murmur Present, no rubs, or gallops.  ABDOMEN: Soft, nontender, nondistended. Bowel sounds present. No organomegaly or mass.  EXTREMITIES: 3+ pedal edema, no cyanosis, or clubbing.   NEUROLOGIC: Cranial nerves II through XII are intact. Muscle strength 5/5 in all extremities. Sensation intact. Gait not checked.  PSYCHIATRIC: The patient is alert and oriented x 3. Blunt affect. SKIN: No obvious rash, lesion, or ulcer.    LABORATORY PANEL:   CBC  Recent Labs Lab 10/22/14 0504  WBC 10.0  HGB 7.6*  HCT 22.3*  PLT 179   ------------------------------------------------------------------------------------------------------------------  Chemistries   Recent Labs Lab 10/22/14 0504  NA 139  K 5.1  CL 116*  CO2 13*  GLUCOSE 97  BUN 98*  CREATININE 11.88*  CALCIUM 8.3*   ------------------------------------------------------------------------------------------------------------------  Cardiac Enzymes No results for input(s): TROPONINI in the last 168 hours. ------------------------------------------------------------------------------------------------------------------  RADIOLOGY:  US Renal  10/21/2014   CLINICAL DATA:  PICC acute renal failure.  EXAM: RENAL / URINARY TRACT ULTRASOUND COMPLETE  COMPARISON:  None.  FINDINGS: Right Kidney:  Length: 9.8 cm. Markedly diffusely echogenic. Three small cysts. No hydronephrosis.  Left Kidney:  Length: 10.4 cm. Markedly diffusely echogenic. Two small cysts. No hydronephrosis.  Bladder:  Appears normal for degree of bladder distention.  IMPRESSION: 1. Markedly echogenic kidneys, compatible with medical renal disease. 2. No hydronephrosis.   Electronically Signed   By: Claudie Revering M.D.   On: 10/21/2014 15:15   Dg Chest Port 1 View  10/21/2014   CLINICAL DATA:  Shortness of breath.  EXAM: PORTABLE CHEST - 1 VIEW  COMPARISON:  None.  FINDINGS: Enlarged cardiac silhouette. Mildly prominent pulmonary vasculature and interstitial markings. No pleural fluid. Unremarkable bones.  IMPRESSION: 1. Mild cardiomegaly and mild pulmonary vascular congestion. 2. Mild chronic interstitial lung disease.   Electronically Signed   By:  Claudie Revering M.D.   On: 10/21/2014 09:21    EKG:  No orders found for this or any previous visit.  ASSESSMENT AND PLAN:   54 year old male with Past medical history significant for CK D with baseline creatinine of 3, hypertension, schizophrenia, hepatitis C presents to the hospital from [facility] ER secondary to acute on chronic kidney disease.  #1 Acute kidney injury on CKD-baseline creatinine of 3 now creatinine greater than 11.  - Patient is still making urine. No hyperkalemia.  -Patient nephrology and vascular consults. Patient likely to get permacath today and to be started on hemodialysis today. Likely has progressed to end-stage renal disease, but will await nephrology input.  #2 acute on chronic anemia-patient has likely anemia of chronic disease from his CKD. However her hemoglobin is at 5.2 on admission and received 2 units packed RBC. Hemoglobin is stable around 7.5. Recheck in a.m. He'll benefit from Epogen with dialysis.  #3 hypertension-malignant hypertension. IV hydralazine when necessary. Will need dialysis. Increased Norvasc and added by mouth hydralazine. Also on atenolol  #4 right renal artery stenosis-noted on renal ultrasound. At this point there is no arterial duplex in this hospital, appreciate vascular input. After renal function is stabilized on dialysis, patient will need an angiogram of the right renal artery. This will cause his blood pressure to go up until then. Adjust medications.  #5 schizophrenia-stable at this time. Continue home medications. Patient on Prolixin, Artane and Celexa and Tegretol.  #6 DVT prophylaxis-on subcutaneous heparin.   All the records are reviewed and case discussed with Care Management/Social Workerr. Management plans discussed with the patient, family and they are in agreement.  CODE STATUS: Full code  TOTAL TIME TAKING CARE OF THIS PATIENT: 40 minutes.   POSSIBLE D/C  DEPENDING ON CLINICAL CONDITION. Physical therapy  consulted.   Gladstone Lighter M.D on 10/22/2014 at 2:34 PM  Between 7am to 6pm - Pager - [phone]  After 6pm go to [email or web address] - password [email or web address] [place] [facility]  Office  [phone]  CC: Primary care physician; Rosita Fire, MD

## 2014-10-22 NOTE — Progress Notes (Signed)
Pre-hd tx 

## 2014-10-22 NOTE — Progress Notes (Signed)
Subjective:   Patient seen during dialysis Tolerating well  1st HD treatment today  Objective:  Vital signs in last 24 hours:  Temp:  [97.3 F (36.3 C)-99 F (37.2 C)] 97.8 F (36.6 C) (05/23 1800) Pulse Rate:  [60-74] 66 (05/23 1930) Resp:  [11-22] 22 (05/23 1930) BP: (170-222)/(90-123) 222/115 mmHg (05/23 1930) SpO2:  [96 %-100 %] 98 % (05/23 1930) Weight:  [81.8 kg (180 lb 5.4 oz)] 81.8 kg (180 lb 5.4 oz) (05/23 1800)  Weight change:  Filed Weights   10/20/14 2155 10/22/14 1800  Weight: 85.548 kg (188 lb 9.6 oz) 81.8 kg (180 lb 5.4 oz)    Intake/Output: I/O last 3 completed shifts: In: 4481 [P.O.:240; I.V.:89; Blood:1223] Out: 8563 [Urine:3425]     Physical Exam: General: NAD laying in bed  HEENT anicteric  Neck supple  Pulm/lungs Clear, normal effort  CVS/Heart Regular, no rub  Abdomen:  Soft, NT  Extremities: No edema  Neurologic: Alert, conversive  Skin: No acute rashes  Access: Rt IJ PC. 5.23.16. Dr Lucky Cowboy       Basic Metabolic Panel:  Recent Labs Lab 10/21/14 0423 10/21/14 1928 10/22/14 0504  NA 138 138 139  K 4.9 5.2* 5.1  CL 113* 111 116*  CO2 13* 13* 13*  GLUCOSE 109* 121* 97  BUN 100* 96* 98*  CREATININE 12.07* 11.69* 11.88*  CALCIUM 7.9* 8.0* 8.3*  PHOS  --  9.6* 9.6*     CBC:  Recent Labs Lab 10/21/14 0423 10/21/14 1928 10/22/14 0504  WBC 6.6 9.2 10.0  NEUTROABS  --  7.7*  --   HGB 5.2* 7.2* 7.6*  HCT 15.9* 21.6* 22.3*  MCV 94.0 92.4 91.8  PLT 156 170 179      Microbiology: No results found for this or any previous visit.  Coagulation Studies: No results for input(s): LABPROT, INR in the last 72 hours.  Urinalysis:  Recent Labs  10/21/14 2246  COLORURINE STRAW*  LABSPEC 1.008  PHURINE 6.0  GLUCOSEU 50*  HGBUR 1+*  BILIRUBINUR NEGATIVE  KETONESUR NEGATIVE  PROTEINUR 100*  NITRITE NEGATIVE  LEUKOCYTESUR NEGATIVE      Imaging: US Renal  10/21/2014   CLINICAL DATA:  PICC acute renal failure.  EXAM:  RENAL / URINARY TRACT ULTRASOUND COMPLETE  COMPARISON:  None.  FINDINGS: Right Kidney:  Length: 9.8 cm. Markedly diffusely echogenic. Three small cysts. No hydronephrosis.  Left Kidney:  Length: 10.4 cm. Markedly diffusely echogenic. Two small cysts. No hydronephrosis.  Bladder:  Appears normal for degree of bladder distention.  IMPRESSION: 1. Markedly echogenic kidneys, compatible with medical renal disease. 2. No hydronephrosis.   Electronically Signed   By: Claudie Revering M.D.   On: 10/21/2014 15:15   Dg Chest Port 1 View  10/21/2014   CLINICAL DATA:  Shortness of breath.  EXAM: PORTABLE CHEST - 1 VIEW  COMPARISON:  None.  FINDINGS: Enlarged cardiac silhouette. Mildly prominent pulmonary vasculature and interstitial markings. No pleural fluid. Unremarkable bones.  IMPRESSION: 1. Mild cardiomegaly and mild pulmonary vascular congestion. 2. Mild chronic interstitial lung disease.   Electronically Signed   By: Claudie Revering M.D.   On: 10/21/2014 09:21     Medications:     . [START ON 10/23/2014] amLODipine  10 mg Oral Daily  . atenolol  25 mg Oral BID  . carbamazepine  500 mg Oral BID  . cholecalciferol  1,000 Units Oral Daily  . citalopram  20 mg Oral Daily  . ferrous fumarate  1 tablet Oral  Daily  . fluPHENAZine  2.5 mg Oral Daily  . heparin  5,000 Units Subcutaneous 3 times per day  . hydrALAZINE  25 mg Oral TID  . ipratropium-albuterol      . pantoprazole  40 mg Oral Daily  . sodium chloride  3 mL Intravenous Q12H  . trihexyphenidyl  5 mg Oral TID WC  . tuberculin  5 Units Intradermal Once   acetaminophen **OR** acetaminophen, hydrALAZINE, ipratropium-albuterol, morphine injection, ondansetron **OR** ondansetron (ZOFRAN) IV  Assessment/ Plan:  54 y.o. male withschizophrenia, hyeprtension, hepatitis C, tobacco abuse who was admitted to [facility] on 10/20/2014 for acute renal failure versus end stage renal disease, anemia, shortness of breath and edema.   1. Acute renal failure on chronic  kidney disease stage III versus End stage renal Disease: patient with BUN of 100 and eGFR of 5.  1st HD treatment today. Tolerating well - next HD treatment tomorrow -  Markedly echogenic kidneys. More c/w ESRD  2. Severe metabolic acidosis.  - expected to improve with HD.   3. Anemia of chronic kidney disease: 2 units PRBC this admission  4. Secondary Hyperparathyroidism: hypocalcemia consistent with SPTH of renal origin - Check PTH and phosphorus  4. Malignant HTN - start ARB at bedtime   LOS: 2 , 5/23/20167:52 PM

## 2014-10-23 LAB — MPO/PR-3 (ANCA) ANTIBODIES: ANCA Proteinase 3: <3.5 U/mL (ref 0.0–3.5)

## 2014-10-23 LAB — PTH, INTACT AND CALCIUM
Calcium, Total (PTH): 8.1 mg/dL — ABNORMAL LOW (ref 8.7–10.2)
PTH: 378 pg/mL — AB (ref 15–65)

## 2014-10-23 LAB — ANCA TITERS
Atypical P-ANCA titer: <1:20 {titer}
P-ANCA: <1:20 {titer}

## 2014-10-23 LAB — TYPE AND SCREEN
ABO/RH(D): O POS
Antibody Screen: NEGATIVE
Unit division: 0
Unit division: 0

## 2014-10-23 LAB — RENAL FUNCTION PANEL
Albumin: 3.2 g/dL — ABNORMAL LOW (ref 3.5–5.0)
Anion gap: 12 (ref 5–15)
BUN: 78 mg/dL — ABNORMAL HIGH (ref 6–20)
CALCIUM: 7.9 mg/dL — AB (ref 8.9–10.3)
CHLORIDE: 106 mmol/L (ref 101–111)
CO2: 20 mmol/L — AB (ref 22–32)
Creatinine, Ser: 9.53 mg/dL — ABNORMAL HIGH (ref 0.61–1.24)
GFR calc Af Amer: 6 mL/min — ABNORMAL LOW (ref >60)
GFR, EST NON AFRICAN AMERICAN: 5 mL/min — AB (ref >60)
Glucose, Bld: 151 mg/dL — ABNORMAL HIGH (ref 65–99)
PHOSPHORUS: 7.4 mg/dL — AB (ref 2.5–4.6)
POTASSIUM: 4.2 mmol/L (ref 3.5–5.1)
Sodium: 138 mmol/L (ref 135–145)

## 2014-10-23 LAB — PROTEIN ELECTRO, RANDOM URINE
ALBUMIN ELP UR: 67.6 %
Alpha-1-Globulin, U: 6 %
Alpha-2-Globulin, U: 8.1 %
Beta Globulin, U: 10.8 %
Gamma Globulin, U: 7.4 %
Total Protein, Urine: 184.2 mg/dL

## 2014-10-23 LAB — CBC
HCT: 22.8 % — ABNORMAL LOW (ref 40.0–52.0)
Hemoglobin: 7.5 g/dL — ABNORMAL LOW (ref 13.0–18.0)
MCH: 30.3 pg (ref 26.0–34.0)
MCHC: 33 g/dL (ref 32.0–36.0)
MCV: 91.9 fL (ref 80.0–100.0)
Platelets: 194 10*3/uL (ref 150–440)
RBC: 2.48 MIL/uL — ABNORMAL LOW (ref 4.40–5.90)
RDW: 15.6 % — AB (ref 11.5–14.5)
WBC: 9.4 10*3/uL (ref 3.8–10.6)

## 2014-10-23 LAB — URINE DRUG SCREEN, QUALITATIVE (ARMC ONLY)
Amphetamines, Ur Screen: NOT DETECTED
BENZODIAZEPINE, UR SCRN: NOT DETECTED
Barbiturates, Ur Screen: NOT DETECTED
CANNABINOID 50 NG, UR ~~LOC~~: NOT DETECTED
COCAINE METABOLITE, UR ~~LOC~~: NOT DETECTED
MDMA (Ecstasy)Ur Screen: NOT DETECTED
METHADONE SCREEN, URINE: NOT DETECTED
OPIATE, UR SCREEN: NOT DETECTED
Phencyclidine (PCP) Ur S: NOT DETECTED
TRICYCLIC, UR SCREEN: NOT DETECTED

## 2014-10-23 LAB — PROTEIN ELECTROPHORESIS, SERUM
A/G RATIO SPE: 1.3 (ref 0.7–2.0)
ALPHA-2-GLOBULIN: 0.6 g/dL (ref 0.4–1.2)
Albumin ELP: 3.1 g/dL — ABNORMAL LOW (ref 3.2–5.6)
Alpha-1-Globulin: 0.2 g/dL (ref 0.1–0.4)
Beta Globulin: 0.8 g/dL (ref 0.6–1.3)
GAMMA GLOBULIN: 0.7 g/dL (ref 0.5–1.6)
Globulin, Total: 2.3 g/dL (ref 2.0–4.5)
Total Protein ELP: 5.4 g/dL — ABNORMAL LOW (ref 6.0–8.5)

## 2014-10-23 LAB — MISC LABCORP TEST (SEND OUT)
Labcorp test code: 16881
Labcorp test code: 6395
Labcorp test code: 6510

## 2014-10-23 LAB — UIFE/LIGHT CHAINS/TP QN, 24-HR UR

## 2014-10-23 LAB — HEPATITIS B SURFACE ANTIBODY, QUANTITATIVE: Hepatitis B-Post: <3.1 m[IU]/mL — ABNORMAL LOW (ref >9.9)

## 2014-10-23 LAB — HEPATITIS B CORE ANTIBODY, TOTAL: Hep B Core Total Ab: NEGATIVE

## 2014-10-23 MED ORDER — CALCIUM ACETATE (PHOS BINDER) 667 MG PO CAPS
1334.0000 mg | ORAL_CAPSULE | Freq: Three times a day (TID) | ORAL | Status: DC
  Administered 2014-10-23 – 2014-10-31 (×16): 1334 mg via ORAL
  Filled 2014-10-23 (×20): qty 2

## 2014-10-23 MED ORDER — EPOETIN ALFA 10000 UNIT/ML IJ SOLN
10000.0000 [IU] | Freq: Once | INTRAMUSCULAR | Status: AC
  Administered 2014-10-23: 10000 [IU] via INTRAVENOUS

## 2014-10-23 MED ORDER — AMLODIPINE BESYLATE 10 MG PO TABS
10.0000 mg | ORAL_TABLET | Freq: Every day | ORAL | Status: DC
  Administered 2014-10-23 – 2014-10-30 (×8): 10 mg via ORAL
  Filled 2014-10-23 (×8): qty 1

## 2014-10-23 MED ORDER — IRBESARTAN 150 MG PO TABS
300.0000 mg | ORAL_TABLET | Freq: Every day | ORAL | Status: DC
  Administered 2014-10-24 – 2014-10-31 (×8): 300 mg via ORAL
  Filled 2014-10-23 (×2): qty 2
  Filled 2014-10-23: qty 3
  Filled 2014-10-23 (×5): qty 2

## 2014-10-23 NOTE — Progress Notes (Signed)
HD tx completed.

## 2014-10-23 NOTE — Progress Notes (Signed)
This note also relates to the following rows which could not be included: Pulse Rate - Cannot attach notes to unvalidated device data Resp - Cannot attach notes to unvalidated device data SpO2 - Cannot attach notes to unvalidated device data   Post hd tx

## 2014-10-23 NOTE — Clinical Social Work Note (Signed)
CSW spoke at length with patient and patient's mother and cousin this afternoon. CSW informed them that physical therapy assessed patient today and have not recommended any further skilled needs for patient. Patient states he is agreeable to placement and his mother and him state he was in a group home level of care for years. CSW explained that it would depend on if any [facility] family care homes had any male medicaid beds available. CSW explained that due to patient being followed by the ACT team that it would be best to keep patient in [facility]. CSW called several family care care homes and was able to find a vacancy at [facility] and they are going to come and evaluate patient tomorrow. Patient and mother are aware. In addition, CSW spoke with Carin Hock at [facility]: [phone] and she stated that if patient chose to remain in his apartment, that they would increase their services to coming out daily to see patient. Judeen Hammans reported that they currently see him 3 times per week and provide him with his prolixin injection twice monthly. CSW will continue to follow. Shela Leff MSW,LCSWA [phone]

## 2014-10-23 NOTE — Progress Notes (Signed)
Pre-hd tx 

## 2014-10-23 NOTE — Progress Notes (Signed)
[place] at [place] NAME: Todd Brown    MR#:  [id number]  DATE OF BIRTH:  1960/08/26  SUBJECTIVE:   Seen walking with physical therapy today, denies any complaints. For his second session of dialysis today. Right chest permacath placed and his first session went well. Improved pedal edema  REVIEW OF SYSTEMS:  Review of Systems  Constitutional: Negative for fever and chills.  Respiratory: Negative for cough, shortness of breath and wheezing.   Cardiovascular: Negative for chest pain and palpitations.  Gastrointestinal: Negative for nausea, vomiting, abdominal pain, diarrhea and constipation.  Genitourinary: Negative for dysuria.  Neurological: Negative for dizziness, seizures and headaches.    DRUG ALLERGIES:   Allergies  Allergen Reactions  . Thorazine [Chlorpromazine]     VITALS:  Blood pressure 196/93, pulse 73, temperature 98 F (36.7 C), temperature source Oral, resp. rate 13, height 5\' 9"  (1.753 m), weight 81.2 kg (179 lb 0.2 oz), SpO2 97 %.  PHYSICAL EXAMINATION:  Physical Exam  GENERAL:  54 y.o.-year-old patient lying in the bed with no acute distress.  EYES: Pupils equal, round, reactive to light and accommodation. No scleral icterus. Extraocular muscles intact.  HEENT: Head atraumatic, normocephalic. Oropharynx and nasopharynx clear.  NECK:  Supple, no jugular venous distention. No thyroid enlargement, no tenderness.  LUNGS: Normal breath sounds bilaterally, no wheezing, ,rhonchi . No use of accessory muscles of respiration. Decreased basilar breath sounds and also Rales present bilaterally CARDIOVASCULAR: S1, S2 normal. Systolic murmur Present, no rubs, or gallops.  ABDOMEN: Soft, nontender, nondistended. Bowel sounds present. No organomegaly or mass.  EXTREMITIES: 1+ pedal edema, no cyanosis, or clubbing.  NEUROLOGIC: Cranial nerves II through XII are intact. Muscle strength 5/5 in all extremities. Sensation  intact. Gait not checked.  PSYCHIATRIC: The patient is alert and oriented x 3. Blunt affect. SKIN: No obvious rash, lesion, or ulcer.    LABORATORY PANEL:   CBC  Recent Labs Lab 10/23/14 1025  WBC 9.4  HGB 7.5*  HCT 22.8*  PLT 194   ------------------------------------------------------------------------------------------------------------------  Chemistries   Recent Labs Lab 10/23/14 0945  NA 138  K 4.2  CL 106  CO2 20*  GLUCOSE 151*  BUN 78*  CREATININE 9.53*  CALCIUM 7.9*   ------------------------------------------------------------------------------------------------------------------  Cardiac Enzymes No results for input(s): TROPONINI in the last 168 hours. ------------------------------------------------------------------------------------------------------------------  RADIOLOGY:  US Renal  10/21/2014   CLINICAL DATA:  PICC acute renal failure.  EXAM: RENAL / URINARY TRACT ULTRASOUND COMPLETE  COMPARISON:  None.  FINDINGS: Right Kidney:  Length: 9.8 cm. Markedly diffusely echogenic. Three small cysts. No hydronephrosis.  Left Kidney:  Length: 10.4 cm. Markedly diffusely echogenic. Two small cysts. No hydronephrosis.  Bladder:  Appears normal for degree of bladder distention.  IMPRESSION: 1. Markedly echogenic kidneys, compatible with medical renal disease. 2. No hydronephrosis.   Electronically Signed   By: Claudie Revering M.D.   On: 10/21/2014 15:15    EKG:  No orders found for this or any previous visit.  ASSESSMENT AND PLAN:   54 year old male with Past medical history significant for CK D with baseline creatinine of 3, hypertension, schizophrenia, hepatitis C presents to the hospital from [facility] ER secondary to acute on chronic kidney disease.  #1 Acute kidney injury on CKD-baseline creatinine of 3,  But creatinine greater than 12 on admission.  - Likely progression to ESRD, second day of dialysis today - likely third session tomorrow. Will need  to set up outpatient dialysis  once nephrology considers it as [place] nephrology and vascular consults. Patient has right chest permacath placed and  started on hemodialysis.  #2 acute on chronic anemia-patient has likely anemia of chronic disease from his CKD. However hemoglobin at 5.2 on admission and received 2 units packed RBC. Hemoglobin is stable around 7.5. on Epogen with dialysis.  #3 hypertension-malignant hypertension. IV hydralazine when necessary.  Increased Norvasc and added by mouth hydralazine. Also on atenolol bid and irbesartan.  #4 right renal artery stenosis-noted on renal ultrasound. At this point there is no arterial duplex in this hospital, appreciate vascular input. After renal function is stabilized on dialysis, patient will need an angiogram of the right renal artery. This will cause his blood pressure to go up until then. Adjust medications.  #5 schizophrenia-stable at this time. Continue home medications. Patient on Prolixin, Artane and Celexa and Tegretol.  #6 DVT prophylaxis-on subcutaneous heparin.  Physical therapy recommended home health.  All the records are reviewed and case discussed with Care Management/Social Workerr. Management plans discussed with the patient, family and they are in agreement.  CODE STATUS: Full code  TOTAL TIME TAKING CARE OF THIS PATIENT: 40 minutes.   POSSIBLE D/C  DEPENDING ON CLINICAL CONDITION. Physical therapy consulted.   Gladstone Lighter M.D on 10/23/2014 at 12:44 PM  Between 7am to 6pm - Pager - [phone]  After 6pm go to [email or web address] - password [email or web address] [place] [facility]  Office  [phone]  CC: Primary care physician; Rosita Fire, MD

## 2014-10-23 NOTE — Care Management Note (Signed)
I will begin placement at dialysis center located in [place] at patients request to be closer to his family.  I spoke with Dr. Candiss Norse yesterday and informed him of patients choice of clinics.  I have all records that are completed at this time ready to send.  Pending HEP B Antigen. Todd Brown [phone]

## 2014-10-23 NOTE — Progress Notes (Signed)
Subjective:   Patient seen during dialysis Tolerating well  2nd HD treatment today No complaints  Objective:  Vital signs in last 24 hours:  Temp:  [97.8 F (36.6 C)-98.4 F (36.9 C)] 98.3 F (36.8 C) (05/24 0940) Pulse Rate:  [60-76] 66 (05/24 1200) Resp:  [11-22] 13 (05/24 1200) BP: (119-225)/(91-123) 201/93 mmHg (05/24 1200) SpO2:  [95 %-100 %] 95 % (05/24 1200) Weight:  [81.1 kg (178 lb 12.7 oz)-82.645 kg (182 lb 3.2 oz)] 81.1 kg (178 lb 12.7 oz) (05/24 0940)  Weight change:  Filed Weights   10/22/14 1800 10/23/14 0102 10/23/14 0940  Weight: 81.8 kg (180 lb 5.4 oz) 82.645 kg (182 lb 3.2 oz) 81.1 kg (178 lb 12.7 oz)    Intake/Output: I/O last 3 completed shifts: In: 3 [I.V.:3] Out: 2000 [Urine:2000]     Physical Exam: General: NAD laying in bed  HEENT anicteric  Neck supple  Pulm/lungs Clear, normal effort  CVS/Heart Regular, no rub  Abdomen:  Soft, NT  Extremities: No edema  Neurologic: Alert, conversive  Skin: No acute rashes  Access: Rt IJ PC. 5.23.16. Dr Lucky Cowboy       Basic Metabolic Panel:  Recent Labs Lab 10/21/14 0423 10/21/14 1928 10/22/14 0504 10/23/14 0945  NA 138 138 139 138  K 4.9 5.2* 5.1 4.2  CL 113* 111 116* 106  CO2 13* 13* 13* 20*  GLUCOSE 109* 121* 97 151*  BUN 100* 96* 98* 78*  CREATININE 12.07* 11.69* 11.88* 9.53*  CALCIUM 7.9* 8.0* 8.3* 7.9*  PHOS  --  9.6* 9.6* 7.4*     CBC:  Recent Labs Lab 10/21/14 0423 10/21/14 1928 10/22/14 0504 10/23/14 1025  WBC 6.6 9.2 10.0 9.4  NEUTROABS  --  7.7*  --   --   HGB 5.2* 7.2* 7.6* 7.5*  HCT 15.9* 21.6* 22.3* 22.8*  MCV 94.0 92.4 91.8 91.9  PLT 156 170 179 194      Microbiology: No results found for this or any previous visit.  Coagulation Studies: No results for input(s): LABPROT, INR in the last 72 hours.  Urinalysis:  Recent Labs  10/21/14 2246  COLORURINE STRAW*  LABSPEC 1.008  PHURINE 6.0  GLUCOSEU 50*  HGBUR 1+*  BILIRUBINUR NEGATIVE  KETONESUR  NEGATIVE  PROTEINUR 100*  NITRITE NEGATIVE  LEUKOCYTESUR NEGATIVE      Imaging: US Renal  10/21/2014   CLINICAL DATA:  PICC acute renal failure.  EXAM: RENAL / URINARY TRACT ULTRASOUND COMPLETE  COMPARISON:  None.  FINDINGS: Right Kidney:  Length: 9.8 cm. Markedly diffusely echogenic. Three small cysts. No hydronephrosis.  Left Kidney:  Length: 10.4 cm. Markedly diffusely echogenic. Two small cysts. No hydronephrosis.  Bladder:  Appears normal for degree of bladder distention.  IMPRESSION: 1. Markedly echogenic kidneys, compatible with medical renal disease. 2. No hydronephrosis.   Electronically Signed   By: Claudie Revering M.D.   On: 10/21/2014 15:15     Medications:     . amLODipine  10 mg Oral Daily  . atenolol  25 mg Oral BID  . carbamazepine  500 mg Oral BID  . cholecalciferol  1,000 Units Oral Daily  . citalopram  20 mg Oral Daily  . epoetin (EPOGEN/PROCRIT) injection  10,000 Units Intravenous Once  . ferrous fumarate  1 tablet Oral Daily  . fluPHENAZine  2.5 mg Oral Daily  . heparin  5,000 Units Subcutaneous 3 times per day  . hydrALAZINE  25 mg Oral TID  . irbesartan  150 mg Oral Daily  .  pantoprazole  40 mg Oral Daily  . sodium chloride  3 mL Intravenous Q12H  . trihexyphenidyl  5 mg Oral TID WC  . tuberculin  5 Units Intradermal Once   acetaminophen **OR** acetaminophen, hydrALAZINE, ipratropium-albuterol, morphine injection, ondansetron **OR** ondansetron (ZOFRAN) IV  Assessment/ Plan:  54 y.o. male withschizophrenia, hyeprtension, hepatitis C, tobacco abuse who was admitted to [facility] on 10/20/2014 for acute renal failure versus end stage renal disease, anemia, shortness of breath and edema.   1.  End stage renal Disease: patient with BUN of 100 and eGFR of 5.  2nd HD treatment today. Tolerating well - next HD treatment tomorrow -  Markedly echogenic kidneys. More c/w ESRD  2. Severe metabolic acidosis.  - expected to improve with HD.   3. Anemia of chronic kidney  disease: 2 units PRBC this admission -continued on epo  4. Secondary Hyperparathyroidism: hypocalcemia consistent with SPTH of renal origin -Start Binders   4. Malignant HTN - increase dose of Irbesartan  - change amlodipine to be given at bed time    LOS: 3 Ameriah Lint 5/24/201612:04 PM

## 2014-10-23 NOTE — Progress Notes (Signed)
Physical Therapy Evaluation Patient Details Name: Todd Brown MRN: [id number] DOB: 10-Mar-1961 Today's Date: 10/23/2014   History of Present Illness  Pt is admitted for SOB and leg swelling. Pt with history of CKD, schizophrenia, and Hep C. Pt with permcath placed on 10/22/14.   Clinical Impression  Pt is a pleasant 54 year old male who was admitted for SOB and leg swelling. Pt performs transfers with supervision and ambulation with cga without need for AD. Pt demonstrates deficits with balance demonstrating increased lateral sway and abnormal gait pattern during ambulation. Would benefit from skilled PT to address above deficits and promote optimal return to PLOF. Pt is not safe to return home alone at dc and would benefit from 24/7 care due to safety. Would recommend ALF transition at this time with HHPT. Does not quality for SNF.      Follow Up Recommendations Supervision/Assistance - 24 hour;Home health PT (ALF)    Equipment Recommendations  None recommended by PT    Recommendations for Other Services       Precautions / Restrictions Precautions Precautions: Fall Restrictions Weight Bearing Restrictions: No      Mobility  Bed Mobility               General bed mobility comments: not performed as pt in recliner upon arrival  Transfers Overall transfer level: Needs assistance Equipment used: None Transfers: Sit to/from Stand           General transfer comment: sit<>stand with supervision. Safe technique performed with no LOB.  Ambulation/Gait Ambulation/Gait assistance: Min guard Ambulation Distance (Feet): 200 Feet Assistive device: None       General Gait Details: ambulated with slight scissoring gait pattern however no formal LOB noted. Pt keeps head down when ambulating and has difficulty controlled speed changes during obstacle avoidance. No AD required at this time.  Stairs            Wheelchair Mobility    Modified Rankin (Stroke  Patients Only)       Balance Overall balance assessment: Needs assistance   Sitting balance-[name]: Normal       Standing balance-[name]: Fair                               Pertinent Vitals/Pain Pain Assessment: No/denies pain    Home Living Family/patient expects to be discharged to:: Skilled nursing facility Living Arrangements: Alone                    Prior Function Level of Independence: Independent               Hand Dominance        Extremity/Trunk Assessment   Upper Extremity Assessment: Overall WFL for tasks assessed           Lower Extremity Assessment: Overall WFL for tasks assessed         Communication   Communication: No difficulties  Cognition Arousal/Alertness: Awake/alert Behavior During Therapy: Flat affect Overall Cognitive Status: No family/caregiver present to determine baseline cognitive functioning                      General Comments      Exercises        Assessment/Plan    PT Assessment Patient needs continued PT services  PT Diagnosis Abnormality of gait   PT Problem List Decreased balance  PT Treatment Interventions Balance  training   PT Goals (Current goals can be found in the Care Plan section) Acute Rehab PT Goals Patient Stated Goal: to walk PT Goal Formulation: With patient Time For Goal Achievement: 11/06/14 Potential to Achieve Goals: Good    Frequency Min 2X/week   Barriers to discharge Decreased caregiver support      Co-evaluation               End of Session Equipment Utilized During Treatment: Gait belt Activity Tolerance: Patient tolerated treatment well Patient left: in chair (no chair alarm present, RN aware) Nurse Communication: Mobility status         Time: [id number] PT Time Calculation (min) (ACUTE ONLY): 16 min   Charges:   PT Evaluation $Initial PT Evaluation Tier I: 1 Procedure     PT G CodesGreggory Stallion, PT,  DPT [phone]   Allyson Tineo 10/23/2014, 10:12 AM

## 2014-10-24 LAB — BASIC METABOLIC PANEL
ANION GAP: 10 (ref 5–15)
BUN: 54 mg/dL — AB (ref 6–20)
CO2: 25 mmol/L (ref 22–32)
CREATININE: 7.36 mg/dL — AB (ref 0.61–1.24)
Calcium: 8.4 mg/dL — ABNORMAL LOW (ref 8.9–10.3)
Chloride: 105 mmol/L (ref 101–111)
GFR calc Af Amer: 9 mL/min — ABNORMAL LOW (ref >60)
GFR calc non Af Amer: 7 mL/min — ABNORMAL LOW (ref >60)
GLUCOSE: 111 mg/dL — AB (ref 65–99)
Potassium: 3.6 mmol/L (ref 3.5–5.1)
SODIUM: 140 mmol/L (ref 135–145)

## 2014-10-24 LAB — MISC LABCORP TEST (SEND OUT): LABCORP TEST CODE: 6510

## 2014-10-24 MED ORDER — HYDRALAZINE HCL 50 MG PO TABS
50.0000 mg | ORAL_TABLET | Freq: Four times a day (QID) | ORAL | Status: DC
  Administered 2014-10-24 – 2014-10-27 (×9): 50 mg via ORAL
  Filled 2014-10-24 (×14): qty 1

## 2014-10-24 MED ORDER — CLONIDINE HCL 0.1 MG PO TABS
0.1000 mg | ORAL_TABLET | Freq: Two times a day (BID) | ORAL | Status: DC
  Administered 2014-10-24 – 2014-10-27 (×6): 0.1 mg via ORAL
  Filled 2014-10-24 (×6): qty 1

## 2014-10-24 NOTE — Care Management Note (Signed)
I spoke with Shela Leff about patients need for dialysis placement on 10/23/14.  She updated me on the patients current living situation and what the options are for the patient at discharge.  She will keep me informed of the arrangements being made for him after discharge so that I can place the patient accordingly. I met with the patient and his mother as well.  I educated them with the Patient Pathways material and we talked about dialysis centers in the [place] area.  Mother did request that patient stay under the care of his current kidney doctor. My previous note stated that I would be setting patient up for out patient dialysis in [facility] is no longer the plan.  CSW is working to keep patient under his current care with [facility] in [place].  Patient and mother both agree that this is what they want. I will want for [facility]  Update and proceed with the placement at that time.  I have gathered current records and I am waiting on HEP B anitgen and PPD results to complete my records for admission. Iran Sizer  [phone]

## 2014-10-24 NOTE — Clinical Social Work Note (Signed)
CSW informed by Dr. Tressia Miners that patient will receive a couple of more dialysis treatments then nephrology will see how patient's kidney function is for a few days to determine if patient will require outpatient dialysis. CSW awaiting Vaughan Basta from [facility] to come and evaluate patient for possible placement vs patient returning home to his own apartment. Shela Leff MSW,LCSWA [phone]

## 2014-10-24 NOTE — Progress Notes (Signed)
Subjective:   Patient seen during dialysis Tolerating well  3rd HD treatment today No complaints  Objective:  Vital signs in last 24 hours:  Temp:  [97.8 F (36.6 C)-99.4 F (37.4 C)] 97.8 F (36.6 C) (05/25 0841) Pulse Rate:  [51-73] 51 (05/25 0841) Resp:  [11-17] 17 (05/25 0841) BP: (114-208)/(79-108) 208/108 mmHg (05/25 0841) SpO2:  [95 %-100 %] 96 % (05/25 0841) Weight:  [81.2 kg (179 lb 0.2 oz)] 81.2 kg (179 lb 0.2 oz) (05/24 1239)  Weight change: -0.7 kg (-1 lb 8.7 oz) Filed Weights   10/23/14 0102 10/23/14 0940 10/23/14 1239  Weight: 82.645 kg (182 lb 3.2 oz) 81.1 kg (178 lb 12.7 oz) 81.2 kg (179 lb 0.2 oz)    Intake/Output: I/O last 3 completed shifts: In: 52 [P.O.:340; I.V.:3] Out: 1150 [Urine:1150]     Physical Exam: General: NAD laying in bed  HEENT anicteric  Neck supple  Pulm/lungs Clear, normal effort  CVS/Heart Regular, no rub  Abdomen:  Soft, NT  Extremities: No edema  Neurologic: Alert, conversive  Skin: No acute rashes  Access: Rt IJ PC. 5.23.16. Dr Lucky Cowboy       Basic Metabolic Panel:  Recent Labs Lab 10/21/14 0423 10/21/14 1928 10/22/14 0504 10/23/14 0945 10/24/14 0505  NA 138 138 139 138 140  K 4.9 5.2* 5.1 4.2 3.6  CL 113* 111 116* 106 105  CO2 13* 13* 13* 20* 25  GLUCOSE 109* 121* 97 151* 111*  BUN 100* 96* 98* 78* 54*  CREATININE 12.07* 11.69* 11.88* 9.53* 7.36*  CALCIUM 7.9* 8.0* 8.3*  8.1* 7.9* 8.4*  PHOS  --  9.6* 9.6* 7.4*  --      CBC:  Recent Labs Lab 10/21/14 0423 10/21/14 1928 10/22/14 0504 10/23/14 1025  WBC 6.6 9.2 10.0 9.4  NEUTROABS  --  7.7*  --   --   HGB 5.2* 7.2* 7.6* 7.5*  HCT 15.9* 21.6* 22.3* 22.8*  MCV 94.0 92.4 91.8 91.9  PLT 156 170 179 194      Microbiology: No results found for this or any previous visit.  Coagulation Studies: No results for input(s): LABPROT, INR in the last 72 hours.  Urinalysis:  Recent Labs  10/21/14 2246  COLORURINE STRAW*  LABSPEC 1.008  PHURINE 6.0   GLUCOSEU 50*  HGBUR 1+*  BILIRUBINUR NEGATIVE  KETONESUR NEGATIVE  PROTEINUR 100*  NITRITE NEGATIVE  LEUKOCYTESUR NEGATIVE      Imaging: No results found.   Medications:     . amLODipine  10 mg Oral QHS  . atenolol  25 mg Oral BID  . calcium acetate  1,334 mg Oral TID WC  . carbamazepine  500 mg Oral BID  . cholecalciferol  1,000 Units Oral Daily  . citalopram  20 mg Oral Daily  . ferrous fumarate  1 tablet Oral Daily  . fluPHENAZine  2.5 mg Oral Daily  . heparin  5,000 Units Subcutaneous 3 times per day  . hydrALAZINE  25 mg Oral TID  . irbesartan  300 mg Oral Daily  . pantoprazole  40 mg Oral Daily  . sodium chloride  3 mL Intravenous Q12H  . trihexyphenidyl  5 mg Oral TID WC   acetaminophen **OR** acetaminophen, hydrALAZINE, ipratropium-albuterol, morphine injection, ondansetron **OR** ondansetron (ZOFRAN) IV  Assessment/ Plan:  54 y.o. male withschizophrenia, hyeprtension, hepatitis C, tobacco abuse who was admitted to [facility] on 10/20/2014 for acute renal failure versus end stage renal disease, anemia, shortness of breath and edema.   1.  ARF vs End stage renal Disease : patient with BUN of 100 and eGFR of 5.  -  Markedly echogenic kidneys. More c/w ESRD -  UOP has improved. Will hold further dialysis to see if there is possibility of return of renal function  2. Severe metabolic acidosis.  - expected to improve with HD.   3. Anemia of chronic kidney disease: 2 units PRBC this admission -continued on epo  4. Secondary Hyperparathyroidism: hypocalcemia consistent with SPTH of renal origin -Start Binders - PhosLo  4. Malignant HTN - increase dose of Irbesartan  - change amlodipine to be given at bed time  - BP is staying high- add clonidine   LOS: 4 Lavar Rosenzweig 5/25/201611:21 AM

## 2014-10-24 NOTE — Progress Notes (Signed)
HD tx completed.

## 2014-10-24 NOTE — Progress Notes (Signed)
Post-HD tx  

## 2014-10-24 NOTE — Progress Notes (Signed)
Noted elevated B/P this am see vitals flow sheet, systolic at 0000000. Prn given per M.D. Order. Will f/u with dayshift RN to recheck B/P in a timely manner.

## 2014-10-24 NOTE — Progress Notes (Signed)
Todd Brown at Todd Brown NAME: Todd Brown    MR#:  [id number]  DATE OF BIRTH:  [birth year]  SUBJECTIVE:   No complaints. Creatinine improving, but with dialysis. Third session of hemodialysis today.  REVIEW OF SYSTEMS:  ROS Constitutional: Negative for fever and chills.  Respiratory: Negative for cough, shortness of breath and wheezing.  Cardiovascular: Negative for chest pain and palpitations.  Gastrointestinal: Negative for nausea, vomiting, abdominal pain, diarrhea and constipation.  Genitourinary: Negative for dysuria.  Neurological: Negative for dizziness, seizures and headaches.    DRUG ALLERGIES:   Allergies  Allergen Reactions  . Thorazine [Chlorpromazine]     VITALS:  Blood pressure 208/108, pulse 62, temperature 97.9 F (36.6 C), temperature source Oral, resp. rate 18, height 5\' 9"  (1.753 m), weight 81.2 kg (179 lb 0.2 oz), SpO2 98 %.  PHYSICAL EXAMINATION:  Physical Exam  GENERAL:  54 y.o.-year-old patient lying in the bed with no acute distress.  EYES: Pupils equal, round, reactive to light and accommodation. No scleral icterus. Extraocular muscles intact.  HEENT: Head atraumatic, normocephalic. Oropharynx and nasopharynx clear.  NECK:  Supple, no jugular venous distention. No thyroid enlargement, no tenderness.  LUNGS: Normal breath sounds bilaterally, no wheezing, ,rhonchi . No use of accessory muscles of respiration. Decreased basilar breath sounds and also Rales present bilaterally CARDIOVASCULAR: S1, S2 normal. Systolic murmur Present, no rubs, or gallops.  ABDOMEN: Soft, nontender, nondistended. Bowel sounds present. No organomegaly or mass.  EXTREMITIES: No pedal edema, no cyanosis, or clubbing.  NEUROLOGIC: Cranial nerves II through XII are intact. Muscle strength 5/5 in all extremities. Sensation intact. Gait not checked.  PSYCHIATRIC: The patient is alert and oriented x 3. Blunt affect. SKIN: No  obvious rash, lesion, or ulcer.    LABORATORY PANEL:   CBC  Recent Labs Lab 10/23/14 1025  WBC 9.4  HGB 7.5*  HCT 22.8*  PLT 194   ------------------------------------------------------------------------------------------------------------------  Chemistries   Recent Labs Lab 10/24/14 0505  NA 140  K 3.6  CL 105  CO2 25  GLUCOSE 111*  BUN 54*  CREATININE 7.36*  CALCIUM 8.4*   ------------------------------------------------------------------------------------------------------------------  Cardiac Enzymes No results for input(s): TROPONINI in the last 168 hours. ------------------------------------------------------------------------------------------------------------------  RADIOLOGY:  No results found.  EKG:  No orders found for this or any previous visit.  ASSESSMENT AND PLAN:   54 year old male with Past medical history significant for CK D with baseline creatinine of 3, hypertension, schizophrenia, hepatitis C presents to the hospital from [facility] secondary to acute on chronic kidney disease.  #1 Acute kidney injury on CKD-baseline creatinine of 3,  But creatinine greater than 12 on admission.  - Third session of hemodialysis today. The creatinine is improving but with hemodialysis  - Improving urine output. Continue to monitor after today to see if his urine output and creatinine continue to improve without hemodialysis. That will help [place] to differentiate acute renal failure or end-stage renal disease. -Appreciate nephrology and vascular consults. Patient has right chest permacath placed and  started on hemodialysis.  #2 acute on chronic anemia-patient has likely anemia of chronic disease from his CKD. However hemoglobin at 5.2 on admission and received 2 units packed RBC. Hemoglobin is stable around 7.5. on Epogen with dialysis.  #3 hypertension-malignant hypertension. IV hydralazine when necessary.  Likely very elevated blood pressure due to  the right renal artery stenosis.  -Increased Norvasc and hydralazine. Also on atenolol bid and irbesartan.  #4 right renal artery  stenosis-noted on renal ultrasound. At this point there is no arterial duplex in this hospital, appreciate vascular input. After renal function is stabilized on dialysis, patient will need an angiogram of the right renal artery. This will cause his blood pressure to go up until then. Adjust medications.  #5 schizophrenia-stable at this time. Continue home medications. Patient on Prolixin, Artane and Celexa and Tegretol.  #6 DVT prophylaxis-on subcutaneous heparin.  Physical therapy recommended home health. Discharge after final diagnosis of end-stage renal disease or acute renal failure made.  All the records are reviewed and case discussed with Care Management/Social Workerr. Management plans discussed with the patient, family and they are in agreement.  CODE STATUS: Full code  TOTAL TIME TAKING CARE OF THIS PATIENT: 40 minutes.   POSSIBLE D/C  DEPENDING ON CLINICAL CONDITION.   Todd Brown M.D on 10/24/2014 at 12:27 PM  Between 7am to 6pm - Pager - [phone]  After 6pm go to [email or web address] - password [email or web address] [facility]  Office  [phone]  CC: Primary care physician; Todd Fire, MD

## 2014-10-24 NOTE — Progress Notes (Signed)
PT Cancellation Note  Patient Details Name: Todd Brown MRN: [id number] DOB: September 09, 1960   Cancelled Treatment:    Reason Eval/Treat Not Completed: Patient at procedure or test/unavailable (at dialysis at this time. Will re-attempt time permitting.)   Shevon Sian 10/24/2014, 11:57 AM [name], PT, DPT [phone]

## 2014-10-25 LAB — BASIC METABOLIC PANEL
Anion gap: 9 (ref 5–15)
BUN: 30 mg/dL — ABNORMAL HIGH (ref 6–20)
CO2: 29 mmol/L (ref 22–32)
Calcium: 8.3 mg/dL — ABNORMAL LOW (ref 8.9–10.3)
Chloride: 101 mmol/L (ref 101–111)
Creatinine, Ser: 5.45 mg/dL — ABNORMAL HIGH (ref 0.61–1.24)
GFR calc non Af Amer: 11 mL/min — ABNORMAL LOW (ref >60)
GFR, EST AFRICAN AMERICAN: 12 mL/min — AB (ref >60)
Glucose, Bld: 109 mg/dL — ABNORMAL HIGH (ref 65–99)
POTASSIUM: 3.4 mmol/L — AB (ref 3.5–5.1)
SODIUM: 139 mmol/L (ref 135–145)

## 2014-10-25 MED ORDER — MAGNESIUM HYDROXIDE 400 MG/5ML PO SUSP: 30.0000 mL | Freq: Every day | ORAL | Status: DC | PRN

## 2014-10-25 MED ORDER — DOCUSATE SODIUM 100 MG PO CAPS
100.0000 mg | ORAL_CAPSULE | Freq: Two times a day (BID) | ORAL | Status: DC
  Administered 2014-10-25 – 2014-10-31 (×11): 100 mg via ORAL
  Filled 2014-10-25 (×12): qty 1

## 2014-10-25 NOTE — Progress Notes (Signed)
Physical Therapy Treatment Patient Details Name: Todd Brown MRN: [id number] DOB: 01-04-1961 Today's Date: 10/25/2014    History of Present Illness Pt is admitted for SOB and leg swelling. Pt with history of CKD, schizophrenia, and Hep C. Pt with permcath placed on 10/22/14.     PT Comments    Continue to assess pt balance/ambulation and possible need for assistive device   Follow Up Recommendations  Supervision/Assistance - 24 hour;Home health PT     Equipment Recommendations   (If ambulation continues unsteady may benefit from rw)    Recommendations for Other Services       Precautions / Restrictions Precautions Precautions: Fall Restrictions Weight Bearing Restrictions: No    Mobility  Bed Mobility Overal bed mobility: Modified Independent                Transfers Overall transfer level: Modified independent Equipment used: None Transfers: Sit to/from Stand Sit to Stand: Min guard;Min assist (Pt impulsive with quick, large movements and turns with LOB)         General transfer comment: Required some Min A due to some impulsivity ; pt becomes agitated with therapist wishing to place gait belt on pt  Ambulation/Gait Ambulation/Gait assistance: Min assist Ambulation Distance (Feet): 400 Feet Assistive device: None Gait Pattern/deviations: Scissoring;Ataxic;Staggering left;Staggering right   Gait velocity interpretation: at or above normal speed for age/gender General Gait Details: Pt with several LOB episodes with scissoring LEs requiring Min A; pt tends to hold railing for additional support. Pt agitated with PT for use of gait belt    Stairs            Wheelchair Mobility    Modified Rankin (Stroke Patients Only)       Balance Overall balance assessment: Needs assistance         Standing balance support: Single extremity supported;No upper extremity supported Standing balance-[name]: Good Standing balance comment:  Dynamically needs assist at times today                     Cognition Arousal/Alertness: Awake/alert Behavior During Therapy: Agitated                        Exercises      General Comments        Pertinent Vitals/Pain Pain Assessment: No/denies pain    Home Living                      Prior Function            PT Goals (current goals can now be found in the care plan section) Progress towards PT goals: Progressing toward goals    Frequency  Min 2X/week    PT Plan Current plan remains appropriate    Co-evaluation             End of Session Equipment Utilized During Treatment: Gait belt Activity Tolerance:  (Agitated; ) Patient left: in bed;with call bell/phone within reach;with bed alarm set (MD in room)     Time: [id number] PT Time Calculation (min) (ACUTE ONLY): 15 min  Charges:  $Gait Training: 8-22 mins                    G Codes:      Charlaine Dalton 10/25/2014, 11:31 AM

## 2014-10-25 NOTE — Clinical Social Work Note (Signed)
Todd Brown with [employer] [facility] came by yesterday afternoon but stated that the nurse would not let her see patient without being able to reach me. Todd Brown said she would return tomorrow at 27. CSW spoke with patient and his mother today and patient stated he would let me know if he wanted to return to his apartment after meeting [employer].  Shela Leff MSW,LCSWA [phone]

## 2014-10-25 NOTE — Progress Notes (Signed)
[place] at [place] NAME: Todd Brown    MR#:  [id number]  DATE OF BIRTH:  03-20-1961  SUBJECTIVE:   Patient's sleeping morning. No urine output recorded yesterday.  According to nurse, and also patient he seems like he is urinating. Asked them to strictly monitor the I's and O's. No dialysis today. Last dialysis was yesterday.  REVIEW OF SYSTEMS:  ROS Constitutional: Negative for fever and chills.  Respiratory: Negative for cough, shortness of breath and wheezing.  Cardiovascular: Negative for chest pain and palpitations.  Gastrointestinal: Negative for nausea, vomiting, abdominal pain, diarrhea and constipation.  Genitourinary: Negative for dysuria.  Neurological: Negative for dizziness, seizures and headaches.    DRUG ALLERGIES:   Allergies  Allergen Reactions  . Thorazine [Chlorpromazine] Other (See Comments)    Reaction:  Unknown     VITALS:  Blood pressure 153/88, pulse 56, temperature 98.2 F (36.8 C), temperature source Oral, resp. rate 18, height 5\' 9"  (1.753 m), weight 81.8 kg (180 lb 5.4 oz), SpO2 100 %.  PHYSICAL EXAMINATION:  Physical Exam  GENERAL:  54 y.o.-year-old patient lying in the bed with no acute distress.  EYES: Pupils equal, round, reactive to light and accommodation. No scleral icterus. Extraocular muscles intact.  HEENT: Head atraumatic, normocephalic. Oropharynx and nasopharynx clear.  NECK:  Supple, no jugular venous distention. No thyroid enlargement, no tenderness.  LUNGS: Normal breath sounds bilaterally, no wheezing, ,rhonchi . No use of accessory muscles of respiration. Decreased basilar breath sounds and also Rales present bilaterally CARDIOVASCULAR: S1, S2 normal. Systolic murmur Present, no rubs, or gallops.  ABDOMEN: Soft, nontender, nondistended. Bowel sounds present. No organomegaly or mass.  EXTREMITIES: No pedal edema, no cyanosis, or clubbing.  NEUROLOGIC: Cranial nerves II  through XII are intact. Muscle strength 5/5 in all extremities. Sensation intact. Gait not checked.  PSYCHIATRIC: The patient is alert and oriented x 3. Blunt affect. SKIN: No obvious rash, lesion, or ulcer.    LABORATORY PANEL:   CBC  Recent Labs Lab 10/23/14 1025  WBC 9.4  HGB 7.5*  HCT 22.8*  PLT 194   ------------------------------------------------------------------------------------------------------------------  Chemistries   Recent Labs Lab 10/25/14 0550  NA 139  K 3.4*  CL 101  CO2 29  GLUCOSE 109*  BUN 30*  CREATININE 5.45*  CALCIUM 8.3*   ------------------------------------------------------------------------------------------------------------------  Cardiac Enzymes No results for input(s): TROPONINI in the last 168 hours. ------------------------------------------------------------------------------------------------------------------  RADIOLOGY:  No results found.  EKG:  No orders found for this or any previous visit.  ASSESSMENT AND PLAN:   54 year old male with Past medical history significant for CK D with baseline creatinine of 3, hypertension, schizophrenia, hepatitis C presents to the hospital from [facility] ER secondary to acute on chronic kidney disease.  #1 Acute kidney injury on CKD-baseline creatinine of 3,  But creatinine greater than 12 on admission.  - Differential of end-stage renal disease versus chronic acute renal failure - Received 3 sessions of hemodialysis, last one being yesterday. Creatinine is improving with dialysis. Now monitor the urine output and check creatinine without dialysis. -Appreciate nephrology and vascular consults. Patient has right chest permacath placed and  - - on hemodialysis.  #2 acute on chronic anemia-patient has likely anemia of chronic disease from his CKD. However hemoglobin at 5.2 on admission and received 2 units packed RBC. Hemoglobin is stable around 7.5. on Epogen with dialysis.  #3  hypertension-malignant hypertension. IV hydralazine when necessary.  Likely very elevated blood pressure due  to the right renal artery stenosis.  -Increased Norvasc and hydralazine. Also on atenolol bid and irbesartan.  #4 right renal artery stenosis-noted on renal ultrasound. At this point there is no arterial duplex in this hospital, appreciate vascular input. After renal function is stabilized on dialysis, patient will need an angiogram of the right renal artery. This will cause his blood pressure to go up until then. Adjust medications.  #5 schizophrenia-stable at this time. Continue home medications. Patient on Prolixin, Artane and Celexa and Tegretol.  #6 DVT prophylaxis-on subcutaneous heparin.  Physical therapy recommended home health. Discharge after final diagnosis of end-stage renal disease or acute renal failure made.  All the records are reviewed and case discussed with Care Management/Social Workerr. Management plans discussed with the patient, family and they are in agreement.  CODE STATUS: Full code  TOTAL TIME TAKING CARE OF THIS PATIENT: 40 minutes.   POSSIBLE D/C  DEPENDING ON CLINICAL CONDITION.   Gladstone Lighter M.D on 10/25/2014 at 2:21 PM  Between 7am to 6pm - Pager - [phone]  After 6pm go to [email or web address] - password [email or web address] [facility]  Office  [phone]  CC: Primary care physician; Rosita Fire, MD

## 2014-10-25 NOTE — Progress Notes (Signed)
Subjective:   Much more alert today Able to communicate better Denies SOB  Voiding more Walked with physical therapist - reports gait being wobbly   Objective:  Vital signs in last 24 hours:  Temp:  [97.9 F (36.6 C)-98.4 F (36.9 C)] 98.2 F (36.8 C) (05/26 0816) Pulse Rate:  [56-75] 56 (05/26 0816) Resp:  [16-20] 18 (05/26 0816) BP: (128-220)/(88-137) 153/88 mmHg (05/26 0816) SpO2:  [98 %-100 %] 100 % (05/26 0816) Weight:  [81.1 kg (178 lb 12.7 oz)-81.8 kg (180 lb 5.4 oz)] 81.8 kg (180 lb 5.4 oz) (05/25 1443)  Weight change: 0.1 kg (3.5 oz) Filed Weights   10/24/14 1104 10/24/14 1418 10/24/14 1443  Weight: 81.2 kg (179 lb 0.2 oz) 81.1 kg (178 lb 12.7 oz) 81.8 kg (180 lb 5.4 oz)    Intake/Output: I/O last 3 completed shifts: In: 50 [P.O.:940; I.V.:3] Out: 550 [Urine:550]     Physical Exam: General: NAD laying in bed  HEENT anicteric  Neck supple  Pulm/lungs Clear, normal effort  CVS/Heart Regular, no rub  Abdomen:  Soft, NT  Extremities: No edema  Neurologic: Alert, follows commands, no focal deficits  Skin: No acute rashes  Access: Rt IJ PC. 5.23.16. Dr Lucky Cowboy       Basic Metabolic Panel:  Recent Labs Lab 10/21/14 1928 10/22/14 0504 10/23/14 0945 10/24/14 0505 10/25/14 0550  NA 138 139 138 140 139  K 5.2* 5.1 4.2 3.6 3.4*  CL 111 116* 106 105 101  CO2 13* 13* 20* 25 29  GLUCOSE 121* 97 151* 111* 109*  BUN 96* 98* 78* 54* 30*  CREATININE 11.69* 11.88* 9.53* 7.36* 5.45*  CALCIUM 8.0* 8.3*  8.1* 7.9* 8.4* 8.3*  PHOS 9.6* 9.6* 7.4*  --   --      CBC:  Recent Labs Lab 10/21/14 0423 10/21/14 1928 10/22/14 0504 10/23/14 1025  WBC 6.6 9.2 10.0 9.4  NEUTROABS  --  7.7*  --   --   HGB 5.2* 7.2* 7.6* 7.5*  HCT 15.9* 21.6* 22.3* 22.8*  MCV 94.0 92.4 91.8 91.9  PLT 156 170 179 194      Microbiology: No results found for this or any previous visit.  Coagulation Studies: No results for input(s): LABPROT, INR in the last 72  hours.  Urinalysis: No results for input(s): COLORURINE, LABSPEC, PHURINE, GLUCOSEU, HGBUR, BILIRUBINUR, KETONESUR, PROTEINUR, UROBILINOGEN, NITRITE, LEUKOCYTESUR in the last 72 hours.  Invalid input(s): APPERANCEUR    Imaging: No results found.   Medications:     . amLODipine  10 mg Oral QHS  . atenolol  25 mg Oral BID  . calcium acetate  1,334 mg Oral TID WC  . carbamazepine  500 mg Oral BID  . cholecalciferol  1,000 Units Oral Daily  . citalopram  20 mg Oral Daily  . cloNIDine  0.1 mg Oral BID  . ferrous fumarate  1 tablet Oral Daily  . fluPHENAZine  2.5 mg Oral Daily  . heparin  5,000 Units Subcutaneous 3 times per day  . hydrALAZINE  50 mg Oral 4 times per day  . irbesartan  300 mg Oral Daily  . pantoprazole  40 mg Oral Daily  . sodium chloride  3 mL Intravenous Q12H  . trihexyphenidyl  5 mg Oral TID WC   acetaminophen **OR** acetaminophen, hydrALAZINE, ipratropium-albuterol, morphine injection, ondansetron **OR** ondansetron (ZOFRAN) IV  Assessment/ Plan:  54 y.o. male withschizophrenia, hyeprtension, hepatitis C, tobacco abuse who was admitted to [facility] on 10/20/2014 for acute renal failure versus  end stage renal disease, anemia, shortness of breath and edema.   1.  ARF vs End stage renal Disease : patient with BUN of 100 and eGFR of 5.  -  Markedly echogenic kidneys. More c/w ESRD -  UOP has improved. Will hold further dialysis to see if there is possibility of return of renal function  2. Severe metabolic acidosis.  - expected to improve with HD.   3. Anemia of chronic kidney disease: 2 units PRBC this admission -continued on epo  4. Secondary Hyperparathyroidism: hypocalcemia consistent with SPTH of renal origin -Start Binders - PhosLo  5. Malignant HTN - clonidine, amlodipine, atenolol, irbesartan   LOS: 5 Deavon Podgorski 5/26/201611:47 AM

## 2014-10-26 ENCOUNTER — Encounter: Payer: Self-pay | Admitting: Vascular Surgery

## 2014-10-26 LAB — BASIC METABOLIC PANEL
Anion gap: 9 (ref 5–15)
BUN: 42 mg/dL — ABNORMAL HIGH (ref 6–20)
CO2: 27 mmol/L (ref 22–32)
Calcium: 8.4 mg/dL — ABNORMAL LOW (ref 8.9–10.3)
Chloride: 105 mmol/L (ref 101–111)
Creatinine, Ser: 6.57 mg/dL — ABNORMAL HIGH (ref 0.61–1.24)
GFR calc Af Amer: 10 mL/min — ABNORMAL LOW (ref >60)
GFR calc non Af Amer: 9 mL/min — ABNORMAL LOW (ref >60)
Glucose, Bld: 67 mg/dL (ref 65–99)
Potassium: 3.8 mmol/L (ref 3.5–5.1)
Sodium: 141 mmol/L (ref 135–145)

## 2014-10-26 LAB — CBC
HCT: 23 % — ABNORMAL LOW (ref 40.0–52.0)
HEMOGLOBIN: 7.6 g/dL — AB (ref 13.0–18.0)
MCH: 31.3 pg (ref 26.0–34.0)
MCHC: 33.1 g/dL (ref 32.0–36.0)
MCV: 94.6 fL (ref 80.0–100.0)
PLATELETS: 176 10*3/uL (ref 150–440)
RBC: 2.44 MIL/uL — ABNORMAL LOW (ref 4.40–5.90)
RDW: 15.6 % — ABNORMAL HIGH (ref 11.5–14.5)
WBC: 9 10*3/uL (ref 3.8–10.6)

## 2014-10-26 NOTE — Clinical Social Work Note (Signed)
Todd Brown with [facility] came to assess patient and states she can take patient Monday or Tuesday. Patient is wanting to try to go to the family care home environment again and wishes to try [employer]. CSW has informed patient's mother as well. Patient has had PPD due to needing it for temporary dialysis. Determination has not yet been made if patient will require outpatient dialysis. ACT to be notified that patient is going to try to go to [facility] at discharge.  Shela Leff MSW,LCSWA [phone]

## 2014-10-26 NOTE — Progress Notes (Signed)
Nutrition Follow-up  DOCUMENTATION CODES:     INTERVENTION:   (Education: Will continue to follow poc regarding need for long term dialysis. Will educate pt appropriately on follow-up once poc determined.)  NUTRITION DIAGNOSIS:  Inadequate oral intake related to other (see comment) (procedure pending) as evidenced by NPO status; improved with diet advancement and % of intake  GOAL:  Patient will meet greater than or equal to 90% of their needs  MONITOR:   (Electrolyte and renal profile, energy intake)  REASON FOR ASSESSMENT:   (Follow-Up)    ASSESSMENT:  Pt last HD on 25th. Per Nephrology note, will hold HD and monitor renal function to determine ESRD versus acute renal failure.  PO Intake: pt ate 100% of 2 breakfast plates this am with multiple juices. Pt reports having a 'very good' appetite.  Medications: Phoslo, vitamin D, colace, protonix  Labs: Electrolyte and Renal Profile:    Recent Labs Lab 10/21/14 1928 10/22/14 0504 10/23/14 0945 10/24/14 0505 10/25/14 0550 10/26/14 0628  BUN 96* 98* 78* 54* 30* 42*  CREATININE 11.69* 11.88* 9.53* 7.36* 5.45* 6.57*  NA 138 139 138 140 139 141  K 5.2* 5.1 4.2 3.6 3.4* 3.8  PHOS 9.6* 9.6* 7.4*  --   --   --    Glucose Profile: No results for input(s): GLUCAP in the last 72 hours.   Weight trend since admission: Filed Weights   10/24/14 1104 10/24/14 1418 10/24/14 1443  Weight: 179 lb 0.2 oz (81.2 kg) 178 lb 12.7 oz (81.1 kg) 180 lb 5.4 oz (81.8 kg)    BMI:  Body mass index is 26.62 kg/(m^2).   Skin:  Reviewed, no issues  Diet Order:  Diet 2 gram sodium Room service appropriate?: Yes; Fluid consistency:: Thin  EDUCATION NEEDS:  Education needs no appropriate at this time   Intake/Output Summary (Last 24 hours) at 10/26/14 1251 Last data filed at 10/26/14 0900  Gross per 24 hour  Intake   1063 ml  Output    350 ml  Net    713 ml  . RD notes, HD removed 25mL on 5/25  Last BM:  5/27  LOW Care  Level  Dwyane Luo, RD, LDN Pager [phone]

## 2014-10-26 NOTE — Progress Notes (Signed)
Pt. BP 193/103 this AM, scheduled PO hydralazine administered, BP rechecked was 185/76.

## 2014-10-26 NOTE — Progress Notes (Signed)
[place] at [place] NAME: Todd Brown    MR#:  [id number]  DATE OF BIRTH:  06-20-1960  SUBJECTIVE:   Patient is very happy today, walking in the hallways with the aid. He agreed to not flush the urine so the nurses can monitor his urine output. Creatinine with some worsening today. Did not receive any dialysis yesterday.  REVIEW OF SYSTEMS:  ROS Constitutional: Negative for fever and chills.  Respiratory: Negative for cough, shortness of breath and wheezing.  Cardiovascular: Negative for chest pain and palpitations.  Gastrointestinal: Negative for nausea, vomiting, abdominal pain, diarrhea and constipation.  Genitourinary: Negative for dysuria.  Neurological: Negative for dizziness, seizures and headaches.    DRUG ALLERGIES:   Allergies  Allergen Reactions  . Thorazine [Chlorpromazine] Other (See Comments)    Reaction:  Unknown     VITALS:  Blood pressure 187/91, pulse 71, temperature 97.9 F (36.6 C), temperature source Oral, resp. rate 16, height 5\' 9"  (1.753 m), weight 81.8 kg (180 lb 5.4 oz), SpO2 100 %.  PHYSICAL EXAMINATION:  Physical Exam  GENERAL:  54 y.o.-year-old patient lying in the bed with no acute distress.  EYES: Pupils equal, round, reactive to light and accommodation. No scleral icterus. Extraocular muscles intact.  HEENT: Head atraumatic, normocephalic. Oropharynx and nasopharynx clear.  NECK:  Supple, no jugular venous distention. No thyroid enlargement, no tenderness.  LUNGS: Normal breath sounds bilaterally, no wheezing, ,rhonchi . No use of accessory muscles of respiration. Decreased basilar breath sounds .  CARDIOVASCULAR: S1, S2 normal. Systolic murmur Present, no rubs, or gallops.  ABDOMEN: Soft, nontender, nondistended. Bowel sounds present. No organomegaly or mass.  EXTREMITIES: No pedal edema, no cyanosis, or clubbing.  NEUROLOGIC: Cranial nerves II through XII are intact. Muscle strength  5/5 in all extremities. Sensation intact. Gait not checked.  PSYCHIATRIC: The patient is alert and oriented x 3. Blunt affect. SKIN: No obvious rash, lesion, or ulcer.    LABORATORY PANEL:   CBC  Recent Labs Lab 10/26/14 0628  WBC 9.0  HGB 7.6*  HCT 23.0*  PLT 176   ------------------------------------------------------------------------------------------------------------------  Chemistries   Recent Labs Lab 10/26/14 0628  NA 141  K 3.8  CL 105  CO2 27  GLUCOSE 67  BUN 42*  CREATININE 6.57*  CALCIUM 8.4*   ------------------------------------------------------------------------------------------------------------------  Cardiac Enzymes No results for input(s): TROPONINI in the last 168 hours. ------------------------------------------------------------------------------------------------------------------  RADIOLOGY:  No results found.  EKG:  No orders found for this or any previous visit.  ASSESSMENT AND PLAN:   54 year old male with Past medical history significant for CK D with baseline creatinine of 3, hypertension, schizophrenia, hepatitis C presents to the hospital from [facility] ER secondary to acute on chronic kidney disease.  #1 Acute kidney injury on CKD-baseline creatinine of 3,  But creatinine greater than 12 on admission.  - Differential of end-stage renal disease versus acute renal failure - Received 3 sessions of hemodialysis, last one being 10/24/14. Creatinine is improving with dialysis. Continue to monitor the urine output  -Without dialysis creatinine worsened today. -Appreciate nephrology and vascular consults. Patient has right chest permacath placed and  - - still not sure if he will need long-term dialysis.  #2 acute on chronic anemia-patient has likely anemia of chronic disease from his CKD. However hemoglobin at 5.2 on admission and received 2 units packed RBC. Hemoglobin is stable around 7.5. on Epogen with dialysis.  #3  hypertension-malignant hypertension. IV hydralazine when necessary.  Likely very elevated blood pressure due to the right renal artery stenosis.  -Increased Norvasc and hydralazine. Also on atenolol bid and irbesartan. If renal function improves or if he is diagnosed with end-stage renal disease and ends up on dialysis, vascular can be reconsulted to see if they can do stent for his right renal artery stenosis  #4 right renal artery stenosis-noted on renal ultrasound. At this point there is no arterial duplex in this hospital, appreciate vascular input. After renal function is stabilized on dialysis, patient will need an angiogram of the right renal artery. This will cause his blood pressure to go up until then. Adjust medications.  #5 schizophrenia-stable at this time. Continue home medications. Patient on Prolixin, Artane and Celexa and Tegretol.  #6 DVT prophylaxis-on subcutaneous heparin.  Physical therapy recommended home health. Discharge after final diagnosis of end-stage renal disease or acute renal failure made.  All the records are reviewed and case discussed with Care Management/Social Workerr. Management plans discussed with the patient, family and they are in agreement.  CODE STATUS: Full code  TOTAL TIME TAKING CARE OF THIS PATIENT: 40 minutes.   POSSIBLE D/C  DEPENDING ON CLINICAL CONDITION.   Gladstone Lighter M.D on 10/26/2014 at 10:37 AM  Between 7am to 6pm - Pager - [phone]  After 6pm go to [email or web address] - password [email or web address] [place] [facility]  Office  [phone]  CC: Primary care physician; Rosita Fire, MD

## 2014-10-26 NOTE — Progress Notes (Signed)
Received pt from Adelfa Koh., RN today @ 1400.

## 2014-10-26 NOTE — Progress Notes (Signed)
Subjective:   Much more alert today Able to communicate better Denies SOB  Voiding more S Cr worse today  Objective:  Vital signs in last 24 hours:  Temp:  [97.9 F (36.6 C)-98.6 F (37 C)] 98.1 F (36.7 C) (05/27 1202) Pulse Rate:  [53-71] 71 (05/27 0812) Resp:  [16-18] 17 (05/27 1202) BP: (157-202)/(76-103) 157/82 mmHg (05/27 1202) SpO2:  [98 %-100 %] 100 % (05/27 0812)  Weight change:  Filed Weights   10/24/14 1104 10/24/14 1418 10/24/14 1443  Weight: 81.2 kg (179 lb 0.2 oz) 81.1 kg (178 lb 12.7 oz) 81.8 kg (180 lb 5.4 oz)    Intake/Output: I/O last 3 completed shifts: In: 52 [P.O.:720; I.V.:6] Out: 350 [Urine:350]     Physical Exam: General: NAD laying in bed  HEENT anicteric  Neck supple  Pulm/lungs Clear, normal effort  CVS/Heart Regular, no rub  Abdomen:  Soft, NT  Extremities: No edema  Neurologic: Alert, follows commands, no focal deficits  Skin: No acute rashes  Access: Rt IJ PC. 5.23.16. Dr Lucky Cowboy       Basic Metabolic Panel:  Recent Labs Lab 10/21/14 1928 10/22/14 6168 10/23/14 0945 10/24/14 0505 10/25/14 0550 10/26/14 0628  NA 138 139 138 140 139 141  K 5.2* 5.1 4.2 3.6 3.4* 3.8  CL 111 116* 106 105 101 105  CO2 13* 13* 20* _0 GLUCOSE 121* 97 151* 111* 109* 67  BUN 96* 98* 78* 54* 30* 42*  CREATININE 11.69* 11.88* 9.53* 7.36* 5.45* 6.57*  CALCIUM 8.0* 8.3*  8.1* 7.9* 8.4* 8.3* 8.4*  PHOS 9.6* 9.6* 7.4*  --   --   --      CBC:  Recent Labs Lab 10/21/14 0423 10/21/14 1928 10/22/14 0504 10/23/14 1025 10/26/14 0628  WBC 6.6 9.2 10.0 9.4 9.0  NEUTROABS  --  7.7*  --   --   --   HGB 5.2* 7.2* 7.6* 7.5* 7.6*  HCT 15.9* 21.6* 22.3* 22.8* 23.0*  MCV 94.0 92.4 91.8 91.9 94.6  PLT 156 170 179 194 176      Microbiology: No results found for this or any previous visit.  Coagulation Studies: No results for input(s): LABPROT, INR in the last 72 hours.  Urinalysis: No results for input(s): COLORURINE, LABSPEC, PHURINE,  GLUCOSEU, HGBUR, BILIRUBINUR, KETONESUR, PROTEINUR, UROBILINOGEN, NITRITE, LEUKOCYTESUR in the last 72 hours.  Invalid input(s): APPERANCEUR    Imaging: No results found.   Medications:     . amLODipine  10 mg Oral QHS  . atenolol  25 mg Oral BID  . calcium acetate  1,334 mg Oral TID WC  . carbamazepine  500 mg Oral BID  . cholecalciferol  1,000 Units Oral Daily  . citalopram  20 mg Oral Daily  . cloNIDine  0.1 mg Oral BID  . docusate sodium  100 mg Oral BID  . ferrous fumarate  1 tablet Oral Daily  . fluPHENAZine  2.5 mg Oral Daily  . heparin  5,000 Units Subcutaneous 3 times per day  . hydrALAZINE  50 mg Oral 4 times per day  . irbesartan  300 mg Oral Daily  . pantoprazole  40 mg Oral Daily  . sodium chloride  3 mL Intravenous Q12H  . trihexyphenidyl  5 mg Oral TID WC   acetaminophen **OR** acetaminophen, hydrALAZINE, ipratropium-albuterol, magnesium hydroxide, morphine injection, ondansetron **OR** ondansetron (ZOFRAN) IV  Assessment/ Plan:  54 y.o. male withschizophrenia, hyeprtension, hepatitis C, tobacco abuse who was admitted to [facility] on 10/20/2014 for  acute renal failure versus end stage renal disease, anemia, shortness of breath and edema.   1.  ARF vs End stage renal Disease : patient presented with BUN of 100 and eGFR of 5.  -  Markedly echogenic kidneys. More c/w ESRD -  UOP has improved. Will hold further dialysis to see if there is possibility of return of renal function. Last HD 5/25 - If BUN/Cr continues to worse, may need to start back on dialysis and need outpatient set up  2. Severe metabolic acidosis.  - expected to improve with HD.   3. Anemia of chronic kidney disease: 2 units PRBC this admission -continued on epo  4. Secondary Hyperparathyroidism: hypocalcemia consistent with SPTH of renal origin -Start Binders - PhosLo  5. Malignant HTN - clonidine, amlodipine, atenolol, irbesartan   LOS: 6 Todd Brown 5/27/201612:54 PM

## 2014-10-26 NOTE — Care Management Note (Signed)
All needed records for new dialysis admission are ready to send if placement is required.  Currently patient is improving. Todd Brown  [phone]

## 2014-10-27 LAB — BASIC METABOLIC PANEL
Anion gap: 11 (ref 5–15)
BUN: 59 mg/dL — AB (ref 6–20)
CALCIUM: 8.5 mg/dL — AB (ref 8.9–10.3)
CO2: 24 mmol/L (ref 22–32)
Chloride: 106 mmol/L (ref 101–111)
Creatinine, Ser: 7.77 mg/dL — ABNORMAL HIGH (ref 0.61–1.24)
GFR calc Af Amer: 8 mL/min — ABNORMAL LOW (ref >60)
GFR calc non Af Amer: 7 mL/min — ABNORMAL LOW (ref >60)
Glucose, Bld: 106 mg/dL — ABNORMAL HIGH (ref 65–99)
Potassium: 4.6 mmol/L (ref 3.5–5.1)
Sodium: 141 mmol/L (ref 135–145)

## 2014-10-27 LAB — GLUCOSE, CAPILLARY: Glucose-Capillary: 92 mg/dL (ref 65–99)

## 2014-10-27 MED ORDER — CLONIDINE HCL 0.1 MG PO TABS
0.2000 mg | ORAL_TABLET | Freq: Three times a day (TID) | ORAL | Status: DC
  Administered 2014-10-27 – 2014-10-30 (×10): 0.2 mg via ORAL
  Filled 2014-10-27 (×10): qty 2

## 2014-10-27 MED ORDER — MINOXIDIL 2.5 MG PO TABS
5.0000 mg | ORAL_TABLET | Freq: Every day | ORAL | Status: DC
  Administered 2014-10-27 – 2014-10-31 (×5): 5 mg via ORAL
  Filled 2014-10-27 (×5): qty 2

## 2014-10-27 MED ORDER — HYDRALAZINE HCL 50 MG PO TABS
100.0000 mg | ORAL_TABLET | Freq: Four times a day (QID) | ORAL | Status: DC
  Administered 2014-10-27 – 2014-10-31 (×15): 100 mg via ORAL
  Filled 2014-10-27 (×15): qty 2

## 2014-10-27 MED ORDER — NITROGLYCERIN 2 % TD OINT
2.0000 [in_us] | TOPICAL_OINTMENT | Freq: Two times a day (BID) | TRANSDERMAL | Status: DC
  Administered 2014-10-27 – 2014-10-29 (×5): 2 [in_us] via TOPICAL
  Administered 2014-10-29: 1 [in_us] via TOPICAL
  Administered 2014-10-30 – 2014-10-31 (×3): 2 [in_us] via TOPICAL
  Filled 2014-10-27 (×9): qty 2

## 2014-10-27 MED ORDER — TUBERCULIN PPD 5 UNIT/0.1ML ID SOLN
5.0000 [IU] | Freq: Once | INTRADERMAL | Status: DC
  Filled 2014-10-27 (×2): qty 0.1

## 2014-10-27 NOTE — Progress Notes (Signed)
Patient tolerated treatment without complications. 77ml fluid removal.  Patient education on low K+ diet.

## 2014-10-27 NOTE — Progress Notes (Signed)
Dr. Fritzi Mandes notified of elevated BP 212/118; pt. Continues to refuse IV stick for IVP hydralazine, able to coax pt to take po; new order rec'd;

## 2014-10-27 NOTE — Progress Notes (Addendum)
[place] at [place] NAME: Todd Brown    MR#:  [id number]  DATE OF BIRTH:  04-27-1961  SUBJECTIVE:   Creatinine increasing, while dialysis is on hold. Making urine. 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 Blood pressure continues to be very elevated. Patient denies any complaints. Last dialysis was on 10/24/2014.  REVIEW OF SYSTEMS:  ROS Constitutional: Negative for fever and chills.  Respiratory: Negative for cough, shortness of breath and wheezing.  Cardiovascular: Negative for chest pain and palpitations.  Gastrointestinal: Negative for nausea, vomiting, abdominal pain, diarrhea and constipation.  Genitourinary: Negative for dysuria.  Neurological: Negative for dizziness, seizures and headaches.    DRUG ALLERGIES:   Allergies  Allergen Reactions  . Thorazine [Chlorpromazine] Other (See Comments)    Reaction:  Unknown     VITALS:  Blood pressure 195/94, pulse 67, temperature 98.2 F (36.8 C), temperature source Oral, resp. rate 17, height 5\' 9"  (1.753 m), weight 81.8 kg (180 lb 5.4 oz), SpO2 99 %.  PHYSICAL EXAMINATION:  Physical Exam  GENERAL:  54 y.o.-year-old patient lying in the bed with no acute distress.  EYES: Pupils equal, round, reactive to light and accommodation. No scleral icterus. Extraocular muscles intact.  HEENT: Head atraumatic, normocephalic. Oropharynx and nasopharynx clear.  NECK:  Supple, no jugular venous distention. No thyroid enlargement, no tenderness.  LUNGS: Normal breath sounds bilaterally, no wheezing, ,rhonchi . No use of accessory muscles of respiration. Decreased basilar breath sounds .  CARDIOVASCULAR: S1, S2 normal. Systolic murmur Present, no rubs, or gallops.  ABDOMEN: Soft, nontender, nondistended. Bowel sounds present. No organomegaly or mass.  EXTREMITIES: No pedal edema, no cyanosis, or clubbing.  NEUROLOGIC: Cranial nerves II through XII are  intact. Muscle strength 5/5 in all extremities. Sensation intact. Gait not checked.  PSYCHIATRIC: The patient is alert and oriented x 3. Blunt affect. SKIN: No obvious rash, lesion, or ulcer.    LABORATORY PANEL:   CBC  Recent Labs Lab 10/26/14 0628  WBC 9.0  HGB 7.6*  HCT 23.0*  PLT 176   ------------------------------------------------------------------------------------------------------------------  Chemistries   Recent Labs Lab 10/27/14 0812  NA 141  K 4.6  CL 106  CO2 24  GLUCOSE 106*  BUN 59*  CREATININE 7.77*  CALCIUM 8.5*   ------------------------------------------------------------------------------------------------------------------  Cardiac Enzymes No results for input(s): TROPONINI in the last 168 hours. ------------------------------------------------------------------------------------------------------------------  RADIOLOGY:  No results found.  EKG:  No orders found for this or any previous visit.  ASSESSMENT AND PLAN:   54 year old male with Past medical history significant for CK D with baseline creatinine of 3, hypertension, schizophrenia, hepatitis C presents to the hospital from [facility] ER secondary to acute on chronic kidney disease.  #1 Acute kidney injury on CKD-baseline creatinine of 3,  But creatinine greater than 12 on admission.  - Differential of end-stage renal disease versus acute renal failure - Received 3 sessions of hemodialysis, last one being 10/24/14. Creatinine is improving with dialysis. Since off of dialysis, creatinine started to get worse. 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 -Appreciate nephrology and vascular consults.  - Patient has right chest permacath placed - Possible dialysis today or tomorrow per nephrology. [name] go ahead and order PPD and hepatitis B antigen.  #2 acute on chronic anemia-patient has likely anemia of chronic disease from his CKD. However  hemoglobin at 5.2 on admission and received 2 units packed RBC. Hemoglobin is stable around 7.5. on Epogen with dialysis.  #  3 hypertension-malignant hypertension. IV hydralazine when necessary.  Likely very elevated blood pressure due to the right renal artery stenosis.  -Increased Norvasc and hydralazine. Also on clonidine, atenolol bid and irbesartan. - Nitro paste was added. Adjust his blood pressure medications further. -  If renal function improves or if he is diagnosed with end-stage renal disease and ends up on dialysis, vascular can be reconsulted to see if they can do stent for his right renal artery stenosis  #4 right renal artery stenosis-noted on renal ultrasound. At this point there is no arterial duplex in this hospital, appreciate vascular input. After renal function is stabilized on dialysis, patient will need an angiogram of the right renal artery. This will cause his blood pressure to go up until then. Adjust medications.  #5 schizophrenia-stable at this time. Continue home medications. Patient on Prolixin, Artane and Celexa and Tegretol.  #6 DVT prophylaxis-on subcutaneous heparin.  Physical therapy recommended home health. Discussed with Dr. Holley Raring. Discharge after final diagnosis of end-stage renal disease or acute renal failure made.  All the records are reviewed and case discussed with Care Management/Social Workerr. Management plans discussed with the patient, family and they are in agreement.  CODE STATUS: Full code  TOTAL TIME TAKING CARE OF THIS PATIENT: 40 minutes.   POSSIBLE D/C  ONCE RENAL FUNCTION IS STABILIZED AND DEPENDING ON CLINICAL CONDITION.   Gladstone Lighter M.D on 10/27/2014 at 10:41 AM  Between 7am to 6pm - Pager - [phone]  After 6pm go to [email or web address] - password [email or web address] [place] [facility]  Office  [phone]  CC: Primary care physician; Rosita Fire, MD

## 2014-10-27 NOTE — Progress Notes (Signed)
Subjective:  Kidney function appears to be worse today. Creatinine up to 7.77. However patient appears to be in good spirits.  Objective:  Vital signs in last 24 hours:  Temp:  [98.1 F (36.7 C)-98.5 F (36.9 C)] 98.2 F (36.8 C) (05/28 1024) Pulse Rate:  [54-74] 67 (05/28 1024) Resp:  [17-20] 17 (05/28 0627) BP: (130-212)/(62-118) 195/94 mmHg (05/28 1024) SpO2:  [99 %-100 %] 99 % (05/28 1024)  Weight change:  Filed Weights   10/24/14 1104 10/24/14 1418 10/24/14 1443  Weight: 81.2 kg (179 lb 0.2 oz) 81.1 kg (178 lb 12.7 oz) 81.8 kg (180 lb 5.4 oz)    Intake/Output: I/O last 3 completed shifts: In: 1903 [P.O.:1900; I.V.:3] Out: 1700 [Urine:1700]     Physical Exam: General: NAD laying in bed  HEENT anicteric  Neck supple  Pulm/lungs Clear bilateral, normal effort  CVS/Heart Regular, no rub  Abdomen:  Soft, NTND BS present  Extremities: No edema  Neurologic: Alert, follows commands, no focal deficits  Skin: No acute rashes  Access: Rt IJ PC. 5.23.16. Dr Lucky Cowboy       Basic Metabolic Panel:  Recent Labs Lab 10/21/14 1928 10/22/14 1962 10/23/14 0945 10/24/14 0505 10/25/14 0550 10/26/14 0628 10/27/14 0812  NA 138 139 138 140 139 141 141  K 5.2* 5.1 4.2 3.6 3.4* 3.8 4.6  CL 111 116* 106 105 101 105 106  CO2 13* 13* 20* 25 29 27 24   GLUCOSE 121* 97 151* 111* 109* 67 106*  BUN 96* 98* 78* 54* 30* 42* 59*  CREATININE 11.69* 11.88* 9.53* 7.36* 5.45* 6.57* 7.77*  CALCIUM 8.0* 8.3*  8.1* 7.9* 8.4* 8.3* 8.4* 8.5*  PHOS 9.6* 9.6* 7.4*  --   --   --   --      CBC:  Recent Labs Lab 10/21/14 0423 10/21/14 1928 10/22/14 0504 10/23/14 1025 10/26/14 0628  WBC 6.6 9.2 10.0 9.4 9.0  NEUTROABS  --  7.7*  --   --   --   HGB 5.2* 7.2* 7.6* 7.5* 7.6*  HCT 15.9* 21.6* 22.3* 22.8* 23.0*  MCV 94.0 92.4 91.8 91.9 94.6  PLT 156 170 179 194 176      Microbiology: No results found for this or any previous visit.  Coagulation Studies: No results for input(s):  LABPROT, INR in the last 72 hours.  Urinalysis: No results for input(s): COLORURINE, LABSPEC, PHURINE, GLUCOSEU, HGBUR, BILIRUBINUR, KETONESUR, PROTEINUR, UROBILINOGEN, NITRITE, LEUKOCYTESUR in the last 72 hours.  Invalid input(s): APPERANCEUR    Imaging: No results found.   Medications:     . amLODipine  10 mg Oral QHS  . atenolol  25 mg Oral BID  . calcium acetate  1,334 mg Oral TID WC  . carbamazepine  500 mg Oral BID  . cholecalciferol  1,000 Units Oral Daily  . citalopram  20 mg Oral Daily  . cloNIDine  0.2 mg Oral TID  . docusate sodium  100 mg Oral BID  . ferrous fumarate  1 tablet Oral Daily  . fluPHENAZine  2.5 mg Oral Daily  . heparin  5,000 Units Subcutaneous 3 times per day  . hydrALAZINE  100 mg Oral 4 times per day  . irbesartan  300 mg Oral Daily  . nitroGLYCERIN  2 inch Topical BID  . pantoprazole  40 mg Oral Daily  . sodium chloride  3 mL Intravenous Q12H  . trihexyphenidyl  5 mg Oral TID WC   acetaminophen **OR** acetaminophen, hydrALAZINE, ipratropium-albuterol, magnesium hydroxide, ondansetron **OR** ondansetron (  ZOFRAN) IV  Assessment/ Plan:  54 y.o. male with schizophrenia, hypertension, hepatitis C, tobacco abuse who was admitted to [facility] on 10/20/2014 for acute renal failure versus end stage renal disease, anemia, shortness of breath and edema.   1.  End stage renal Disease : patient presented with BUN of 100 and eGFR of 5.  -  Markedly echogenic kidneys. More c/w ESRD -  BUN/Cr trending up off dialysis, at this point in time it appears the patient does have ESRD.  Will resume HD today and prepare orders.  2. Severe metabolic acidosis.  - serum bicarb 24 and acceptable, will continue to monitor   3. Anemia of chronic kidney disease: 2 units PRBC this admission -hgb 7.6, hold off epogen until better blood pressure control achieved.  4. Secondary Hyperparathyroidism: hypocalcemia consistent with SPTH of renal origin -Start Binders - PhosLo  5.  Malignant HTN - blood pressure remains quite high.  Pt on 5 antihypertensives.  Renal angiogram being considered.  Will add minoxidil.   LOS: 7 Annely Sliva 5/28/201611:39 AM

## 2014-10-27 NOTE — Progress Notes (Signed)
BP elevated; IV access not available; pt. Refuses to be restuck; pm meds given; BP med midnight dose to be given; Dr. Reece Levy notified of pt's BP and refusal of IV access; acknowleged; Barbaraann Faster, RN [id number]; [phone]

## 2014-10-28 LAB — BASIC METABOLIC PANEL
Anion gap: 6 (ref 5–15)
BUN: 30 mg/dL — ABNORMAL HIGH (ref 6–20)
CO2: 31 mmol/L (ref 22–32)
Calcium: 8.3 mg/dL — ABNORMAL LOW (ref 8.9–10.3)
Chloride: 103 mmol/L (ref 101–111)
Creatinine, Ser: 4.77 mg/dL — ABNORMAL HIGH (ref 0.61–1.24)
GFR calc Af Amer: 15 mL/min — ABNORMAL LOW (ref >60)
GFR calc non Af Amer: 13 mL/min — ABNORMAL LOW (ref >60)
Glucose, Bld: 152 mg/dL — ABNORMAL HIGH (ref 65–99)
POTASSIUM: 3.9 mmol/L (ref 3.5–5.1)
Sodium: 140 mmol/L (ref 135–145)

## 2014-10-28 LAB — HEPATITIS B SURFACE ANTIBODY, QUANTITATIVE

## 2014-10-28 MED ORDER — ALBUTEROL SULFATE HFA 108 (90 BASE) MCG/ACT IN AERS
2.0000 | INHALATION_SPRAY | RESPIRATORY_TRACT | Status: DC | PRN
  Filled 2014-10-28: qty 6.7

## 2014-10-28 MED ORDER — ALBUTEROL SULFATE HFA 108 (90 BASE) MCG/ACT IN AERS: 2.0000 | INHALATION_SPRAY | RESPIRATORY_TRACT | Status: DC | PRN

## 2014-10-28 NOTE — Progress Notes (Signed)
[place] at [place] NAME: Todd Brown    MR#:  [id number]  DATE OF BIRTH:  1960/11/05  SUBJECTIVE:   Complains of chest pain and dyspnea when he sleeps at night- prior to admission too. Says its improving now. Wants to get breathing treatements or inhalers.  REVIEW OF SYSTEMS:  ROS Constitutional: Negative for fever and chills.  Respiratory: Negative for cough, shortness of breath and wheezing.  Complains of orthopnea Cardiovascular: chest pain at night present and no palpitations.  Gastrointestinal: Negative for nausea, vomiting, abdominal pain, diarrhea and constipation.  Genitourinary: Negative for dysuria.  Neurological: Negative for dizziness, seizures and headaches.    DRUG ALLERGIES:   Allergies  Allergen Reactions  . Thorazine [Chlorpromazine] Other (See Comments)    Reaction:  Unknown     VITALS:  Blood pressure 168/70, pulse 52, temperature 98.2 F (36.8 C), temperature source Oral, resp. rate 18, height 5\' 9"  (1.753 m), weight 78.835 kg (173 lb 12.8 oz), SpO2 100 %.  PHYSICAL EXAMINATION:  Physical Exam  GENERAL:  54 y.o.-year-old patient lying in the bed with no acute distress.  EYES: Pupils equal, round, reactive to light and accommodation. No scleral icterus. Extraocular muscles intact.  HEENT: Head atraumatic, normocephalic. Oropharynx and nasopharynx clear.  NECK:  Supple, no jugular venous distention. No thyroid enlargement, no tenderness.  LUNGS: Normal breath sounds bilaterally, no wheezing, ,rhonchi . No use of accessory muscles of respiration. Decreased basilar breath sounds .  CARDIOVASCULAR: S1, S2 normal. Systolic murmur Present, no rubs, or gallops.  ABDOMEN: Soft, nontender, nondistended. Bowel sounds present. No organomegaly or mass.  EXTREMITIES: No pedal edema, no cyanosis, or clubbing.  NEUROLOGIC: Cranial nerves II through XII are intact. Muscle strength 5/5 in all extremities.  Sensation intact. Gait not checked.  PSYCHIATRIC: The patient is alert and oriented x 3. Blunt affect. SKIN: No obvious rash, lesion, or ulcer.    LABORATORY PANEL:   CBC  Recent Labs Lab 10/26/14 0628  WBC 9.0  HGB 7.6*  HCT 23.0*  PLT 176   ------------------------------------------------------------------------------------------------------------------  Chemistries   Recent Labs Lab 10/28/14 0606  NA 140  K 3.9  CL 103  CO2 31  GLUCOSE 152*  BUN 30*  CREATININE 4.77*  CALCIUM 8.3*   ------------------------------------------------------------------------------------------------------------------  Cardiac Enzymes No results for input(s): TROPONINI in the last 168 hours. ------------------------------------------------------------------------------------------------------------------  RADIOLOGY:  No results found.  EKG:  No orders found for this or any previous visit.  ASSESSMENT AND PLAN:   54 year old male with Past medical history significant for CKD with baseline creatinine of 3, hypertension, schizophrenia, hepatitis C presents to the hospital from [facility] ER secondary to acute on chronic kidney disease.  #1 Acute kidney injury on CKD-baseline creatinine of 3,  But creatinine greater than 12 on admission.  -  Creatinine is improving with dialysis. 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 -Appreciate nephrology and vascular consults.  - Patient has right chest permacath placed -We'll go ahead and order PPD and hepatitis B antigen. - Education officer, museum for outpt dialysis set up once ESRD diagnosis made by nephrology  #2 acute on chronic anemia-patient has likely anemia of chronic disease from his CKD. However hemoglobin at 5.2 on admission and received 2 units packed RBC. Hemoglobin is stable around 7.5.  - Epogen with dialysis once his BP improves.  #3 hypertension-malignant hypertension. IV  hydralazine when necessary.  Likely very elevated blood pressure due to the  right renal artery stenosis.  - BP better today -on high doses of Norvasc, hydralazine, clonidine, atenolol bid and irbesartan. - minoxidil added by nephrology with good results today -  If renal function improves or if he is diagnosed with end-stage renal disease and ends up on dialysis, vascular can be reconsulted to see if they can do stent for his right renal artery stenosis  #4 right renal artery stenosis-noted on renal ultrasound. At this point there is no arterial duplex in this hospital, appreciate vascular input. After renal function is stabilized on dialysis, patient will need an angiogram of the right renal artery. This will cause his blood pressure to go up until then. Adjust medications.  #5 schizophrenia-stable at this time. Continue home medications. Patient on Prolixin, Artane and Celexa and Tegretol.  #6 DVT prophylaxis-on subcutaneous heparin.  Physical therapy recommended home health. Discussed with Dr. Holley Raring. Discharge after final diagnosis of end-stage renal disease or acute renal failure made.  All the records are reviewed and case discussed with Care Management/Social Workerr. Management plans discussed with the patient, family and they are in agreement.  CODE STATUS: Full code  TOTAL TIME TAKING CARE OF THIS PATIENT: 40 minutes.   IF CONSIDERED ESRD, OUTPATIENT DIALYSIS NEED TO BE SETUP.  Gladstone Lighter M.D on 10/28/2014 at 8:28 AM  Between 7am to 6pm - Pager - [phone]  After 6pm go to [email or web address] - password [email or web address] [place] [facility]  Office  [phone]  CC: Primary care physician; Rosita Fire, MD

## 2014-10-28 NOTE — Progress Notes (Signed)
Subjective:  Pt had HD yesterday. Tolerated well. In good spirits today. Having some periodic shortness of breath.  Objective:  Vital signs in last 24 hours:  Temp:  [97.8 F (36.6 C)-98.6 F (37 C)] 98.2 F (36.8 C) (05/29 0752) Pulse Rate:  [50-72] 64 (05/29 1019) Resp:  [16-18] 18 (05/29 0752) BP: (122-194)/(52-102) 168/70 mmHg (05/29 0752) SpO2:  [98 %-100 %] 100 % (05/29 0752) Weight:  [78.5 kg (173 lb 1 oz)-81.8 kg (180 lb 5.4 oz)] 78.835 kg (173 lb 12.8 oz) (05/28 1925)  Weight change:  Filed Weights   10/27/14 1432 10/27/14 1911 10/27/14 1925  Weight: 81.8 kg (180 lb 5.4 oz) 78.5 kg (173 lb 1 oz) 78.835 kg (173 lb 12.8 oz)    Intake/Output: I/O last 3 completed shifts: In: 3 [P.O.:960; I.V.:3] Out: 1450 [Urine:1450]     Physical Exam: General: NAD laying in bed  HEENT anicteric  Neck supple  Pulm/lungs Clear bilateral, normal effort  CVS/Heart Regular, no rub  Abdomen:  Soft, NTND BS present  Extremities: No edema  Neurologic: Alert, follows commands, no focal deficits  Skin: No acute rashes  Access: Rt IJ PC. 5.23.16. Dr Lucky Cowboy       Basic Metabolic Panel:  Recent Labs Lab 10/21/14 1928 10/22/14 1655 10/23/14 0945 10/24/14 0505 10/25/14 0550 10/26/14 3748 10/27/14 0812 10/28/14 0606  NA 138 139 138 140 139 141 141 140  K 5.2* 5.1 4.2 3.6 3.4* 3.8 4.6 3.9  CL 111 116* 106 105 101 105 106 103  CO2 13* 13* 20* _0 GLUCOSE 121* 97 151* 111* 109* 67 106* 152*  BUN 96* 98* 78* 54* 30* 42* 59* 30*  CREATININE 11.69* 11.88* 9.53* 7.36* 5.45* 6.57* 7.77* 4.77*  CALCIUM 8.0* 8.3*  8.1* 7.9* 8.4* 8.3* 8.4* 8.5* 8.3*  PHOS 9.6* 9.6* 7.4*  --   --   --   --   --      CBC:  Recent Labs Lab 10/21/14 1928 10/22/14 0504 10/23/14 1025 10/26/14 0628  WBC 9.2 10.0 9.4 9.0  NEUTROABS 7.7*  --   --   --   HGB 7.2* 7.6* 7.5* 7.6*  HCT 21.6* 22.3* 22.8* 23.0*  MCV 92.4 91.8 91.9 94.6  PLT 170 179 194 176      Microbiology: No  results found for this or any previous visit.  Coagulation Studies: No results for input(s): LABPROT, INR in the last 72 hours.  Urinalysis: No results for input(s): COLORURINE, LABSPEC, PHURINE, GLUCOSEU, HGBUR, BILIRUBINUR, KETONESUR, PROTEINUR, UROBILINOGEN, NITRITE, LEUKOCYTESUR in the last 72 hours.  Invalid input(s): APPERANCEUR    Imaging: No results found.   Medications:     . amLODipine  10 mg Oral QHS  . atenolol  25 mg Oral BID  . calcium acetate  1,334 mg Oral TID WC  . carbamazepine  500 mg Oral BID  . cholecalciferol  1,000 Units Oral Daily  . citalopram  20 mg Oral Daily  . cloNIDine  0.2 mg Oral TID  . docusate sodium  100 mg Oral BID  . ferrous fumarate  1 tablet Oral Daily  . fluPHENAZine  2.5 mg Oral Daily  . heparin  5,000 Units Subcutaneous 3 times per day  . hydrALAZINE  100 mg Oral 4 times per day  . irbesartan  300 mg Oral Daily  . minoxidil  5 mg Oral Daily  . nitroGLYCERIN  2 inch Topical BID  . pantoprazole  40 mg Oral Daily  .  sodium chloride  3 mL Intravenous Q12H  . trihexyphenidyl  5 mg Oral TID WC   acetaminophen **OR** acetaminophen, albuterol, hydrALAZINE, magnesium hydroxide, ondansetron **OR** ondansetron (ZOFRAN) IV  Assessment/ Plan:  54 y.o. male with schizophrenia, hypertension, hepatitis C, tobacco abuse who was admitted to [facility] on 10/20/2014 for acute renal failure versus end stage renal disease, anemia, shortness of breath and edema.   1.  End stage renal Disease : patient presented with BUN of 100 and eGFR of 5.  -  Markedly echogenic kidneys. More c/w ESRD -  Pt had HD yesterday, tolerated well, next HD on Tuesday, hopefully SW will assist with placement early next week.  2. Severe metabolic acidosis.  - resolved with dialysis.  3. Anemia of chronic kidney disease: 2 units PRBC this admission -will need to start epogen as an outpt.  4. Secondary Hyperparathyroidism: hypocalcemia consistent with SPTH of renal  origin -continue phoslo.  5. Malignant HTN - BP control improved after addition of minoxidil.     LOS: 8 ,  5/29/201612:15 PM     

## 2014-10-29 LAB — BASIC METABOLIC PANEL
Anion gap: 8 (ref 5–15)
BUN: 41 mg/dL — ABNORMAL HIGH (ref 6–20)
CALCIUM: 8.5 mg/dL — AB (ref 8.9–10.3)
CHLORIDE: 107 mmol/L (ref 101–111)
CO2: 29 mmol/L (ref 22–32)
Creatinine, Ser: 6.37 mg/dL — ABNORMAL HIGH (ref 0.61–1.24)
GFR calc Af Amer: 10 mL/min — ABNORMAL LOW (ref >60)
GFR calc non Af Amer: 9 mL/min — ABNORMAL LOW (ref >60)
Glucose, Bld: 96 mg/dL (ref 65–99)
Potassium: 4.6 mmol/L (ref 3.5–5.1)
SODIUM: 144 mmol/L (ref 135–145)

## 2014-10-29 MED ORDER — IPRATROPIUM-ALBUTEROL 0.5-2.5 (3) MG/3ML IN SOLN
3.0000 mL | Freq: Every day | RESPIRATORY_TRACT | Status: DC
  Administered 2014-10-29 – 2014-10-30 (×2): 3 mL via RESPIRATORY_TRACT
  Filled 2014-10-29 (×3): qty 3

## 2014-10-29 NOTE — Care Management (Signed)
Patient new diagnosis today for ESRD per Dr Holley Raring. Anticiapate discharge back to group home with Home Health. Kim at [facility] working on community dialysis seat.

## 2014-10-29 NOTE — Progress Notes (Signed)
[place] at [place] NAME: Javari Nordine    MR#:  [id number]  DATE OF BIRTH:  10-16-1960  SUBJECTIVE:   - Continues to get worried about not having nebulizer treatments. Explained about his stable respiratory status. -Last dialysis on Saturday, next dialysis scheduled for Tuesday. -Continues to void, renal function improving with dialysis.  REVIEW OF SYSTEMS:  ROS Constitutional: Negative for fever and chills.  Respiratory: Negative for cough, shortness of breath and wheezing.  Complains of orthopnea Cardiovascular: chest pain at night present and no palpitations.  Gastrointestinal: Negative for nausea, vomiting, abdominal pain, diarrhea and constipation.  Genitourinary: Negative for dysuria.  Neurological: Negative for dizziness, seizures and headaches.    DRUG ALLERGIES:   Allergies  Allergen Reactions  . Thorazine [Chlorpromazine] Other (See Comments)    Reaction:  Unknown     VITALS:  Blood pressure 130/74, pulse 64, temperature 98.6 F (37 C), temperature source Oral, resp. rate 18, height 5\' 9"  (1.753 m), weight 78.835 kg (173 lb 12.8 oz), SpO2 97 %.  PHYSICAL EXAMINATION:  Physical Exam  GENERAL:  54 y.o.-year-old patient lying in the bed with no acute distress.  EYES: Pupils equal, round, reactive to light and accommodation. No scleral icterus. Extraocular muscles intact.  HEENT: Head atraumatic, normocephalic. Oropharynx and nasopharynx clear.  NECK:  Supple, no jugular venous distention. No thyroid enlargement, no tenderness.  LUNGS: Normal breath sounds bilaterally, no wheezing, ,rhonchi . No use of accessory muscles of respiration. Decreased basilar breath sounds .  CARDIOVASCULAR: S1, S2 normal. Systolic murmur Present, no rubs, or gallops.  ABDOMEN: Soft, nontender, nondistended. Bowel sounds present. No organomegaly or mass.  EXTREMITIES: No pedal edema, no cyanosis, or clubbing.  NEUROLOGIC: Cranial  nerves II through XII are intact. Muscle strength 5/5 in all extremities. Sensation intact. Gait not checked.  PSYCHIATRIC: The patient is alert and oriented x 3. Blunt affect. SKIN: No obvious rash, lesion, or ulcer.    LABORATORY PANEL:   CBC  Recent Labs Lab 10/26/14 0628  WBC 9.0  HGB 7.6*  HCT 23.0*  PLT 176   ------------------------------------------------------------------------------------------------------------------  Chemistries   Recent Labs Lab 10/29/14 0754  NA 144  K 4.6  CL 107  CO2 29  GLUCOSE 96  BUN 41*  CREATININE 6.37*  CALCIUM 8.5*   ------------------------------------------------------------------------------------------------------------------  Cardiac Enzymes No results for input(s): TROPONINI in the last 168 hours. ------------------------------------------------------------------------------------------------------------------  RADIOLOGY:  No results found.  EKG:  No orders found for this or any previous visit.  ASSESSMENT AND PLAN:   54 year old male with Past medical history significant for CKD with baseline creatinine of 3, hypertension, schizophrenia, hepatitis C presents to the hospital from [facility] ER secondary to acute on chronic kidney disease.  #1 Acute kidney injury on CKD-baseline creatinine of 3,  But creatinine greater than 12 on admission.  - CKD progress to end-stage renal disease -  Creatinine is improving with dialysis. 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 -Appreciate nephrology and vascular consults.  - Patient has right chest permacath placed -PPD and hepatitis B are negative. - Education officer, museum for outpt dialysis set up and patient can be discharged  #2 acute on chronic anemia-patient has likely anemia of chronic disease from his CKD. However hemoglobin at 5.2 on admission and received 2 units packed RBC. Hemoglobin is stable around 7.5.  - Epogen with dialysis once  his BP improves.  #3 hypertension-malignant hypertension. IV hydralazine when necessary.  Likely very  elevated blood pressure due to the right renal artery stenosis.  - BP March better today -on high doses of Norvasc, hydralazine, clonidine, atenolol bid and irbesartan. - minoxidil added by nephrology with good results today -  Renal angiogram by vascular as an outpatient for his right renal artery stenosis.  #4 right renal artery stenosis-noted on renal ultrasound. At this point there is no arterial duplex in this hospital, appreciate vascular input. After renal function is stabilized on dialysis, patient will need an angiogram of the right renal artery. Check with vascular about doing the renal artery angiogram with her as an inpatient or outpatient.   #5 schizophrenia-stable at this time. Continue home medications. Patient on Prolixin, Artane and Celexa and Tegretol.  #6 DVT prophylaxis-on subcutaneous heparin.  Physical therapy recommended home health. Discussed with Dr. Holley Raring.  All the records are reviewed and case discussed with Care Management/Social Workerr. Management plans discussed with the patient, family and they are in agreement.  CODE STATUS: Full code  TOTAL TIME TAKING CARE OF THIS PATIENT: 40 minutes.   CONSIDERED ESRD, OUTPATIENT DIALYSIS NEED TO BE SETUP.  Millard Bautch M.D on 10/29/2014 at 9:59 AM  Between 7am to 6pm - Pager - [phone]  After 6pm go to [email or web address] - password [email or web address] [place] [facility]  Office  [phone]  CC: Primary care physician; Rosita Fire, MD

## 2014-10-29 NOTE — Progress Notes (Signed)
Subjective:  Pt seen at bedside.  Pt considered ESRD at this point.  Cr trending up off of dialysis. BP now under much better control.  Objective:  Vital signs in last 24 hours:  Temp:  [98.1 F (36.7 C)-98.6 F (37 C)] 98.6 F (37 C) (05/30 0818) Pulse Rate:  [58-66] 64 (05/30 0818) Resp:  [16-20] 18 (05/30 0818) BP: (116-143)/(48-77) 130/74 mmHg (05/30 0818) SpO2:  [97 %-100 %] 97 % (05/30 0818)  Weight change:  Filed Weights   10/27/14 1432 10/27/14 1911 10/27/14 1925  Weight: 81.8 kg (180 lb 5.4 oz) 78.5 kg (173 lb 1 oz) 78.835 kg (173 lb 12.8 oz)    Intake/Output: I/O last 3 completed shifts: In: 1517 [P.O.:1320; I.V.:6] Out: 2000 [Urine:2000]     Physical Exam: General: NAD laying in bed  HEENT anicteric  Neck supple  Pulm/lungs Clear bilateral, normal effort  CVS/Heart Regular, no rub  Abdomen:  Soft, NTND BS present  Extremities: No edema  Neurologic: Alert, follows commands, no focal deficits  Skin: No acute rashes  Access: Rt IJ PC. 5.23.16. Dr Lucky Cowboy       Basic Metabolic Panel:  Recent Labs Lab 10/23/14 0945  10/25/14 0550 10/26/14 6160 10/27/14 7371 10/28/14 0606 10/29/14 0754  NA 138  < > 139 141 141 140 144  K 4.2  < > 3.4* 3.8 4.6 3.9 4.6  CL 106  < > 101 105 106 103 107  CO2 20*  < > _0 GLUCOSE 151*  < > 109* 67 106* 152* 96  BUN 78*  < > 30* 42* 59* 30* 41*  CREATININE 9.53*  < > 5.45* 6.57* 7.77* 4.77* 6.37*  CALCIUM 7.9*  < > 8.3* 8.4* 8.5* 8.3* 8.5*  PHOS 7.4*  --   --   --   --   --   --   < > = values in this interval not displayed.   CBC:  Recent Labs Lab 10/23/14 1025 10/26/14 0628  WBC 9.4 9.0  HGB 7.5* 7.6*  HCT 22.8* 23.0*  MCV 91.9 94.6  PLT 194 176      Microbiology: No results found for this or any previous visit.  Coagulation Studies: No results for input(s): LABPROT, INR in the last 72 hours.  Urinalysis: No results for input(s): COLORURINE, LABSPEC, PHURINE, GLUCOSEU, HGBUR,  BILIRUBINUR, KETONESUR, PROTEINUR, UROBILINOGEN, NITRITE, LEUKOCYTESUR in the last 72 hours.  Invalid input(s): APPERANCEUR    Imaging: No results found.   Medications:     . amLODipine  10 mg Oral QHS  . atenolol  25 mg Oral BID  . calcium acetate  1,334 mg Oral TID WC  . carbamazepine  500 mg Oral BID  . cholecalciferol  1,000 Units Oral Daily  . citalopram  20 mg Oral Daily  . cloNIDine  0.2 mg Oral TID  . docusate sodium  100 mg Oral BID  . ferrous fumarate  1 tablet Oral Daily  . fluPHENAZine  2.5 mg Oral Daily  . heparin  5,000 Units Subcutaneous 3 times per day  . hydrALAZINE  100 mg Oral 4 times per day  . ipratropium-albuterol  3 mL Nebulization QHS  . irbesartan  300 mg Oral Daily  . minoxidil  5 mg Oral Daily  . nitroGLYCERIN  2 inch Topical BID  . pantoprazole  40 mg Oral Daily  . sodium chloride  3 mL Intravenous Q12H  . trihexyphenidyl  5 mg Oral TID WC  acetaminophen **OR** acetaminophen, hydrALAZINE, magnesium hydroxide, ondansetron **OR** ondansetron (ZOFRAN) IV  Assessment/ Plan:  54 y.o. male with schizophrenia, hypertension, hepatitis C, tobacco abuse who was admitted to [facility] on 10/20/2014 for acute renal failure versus end stage renal disease, anemia, shortness of breath and edema.   1.  End stage renal Disease : patient presented with BUN of 100 and eGFR of 5.  -  Markedly echogenic kidneys. More c/w ESRD -  Cr trending up off of dialysis.   -  Will plan for HD again tomorrow.  2. Severe metabolic acidosis.  - resolved with dialysis.  3. Anemia of chronic kidney disease: 2 units PRBC this admission -last hgb 7.6, will start epogen as outpt.  4. Secondary Hyperparathyroidism: hypocalcemia consistent with SPTH of renal origin -last phos 7.4, should have come down with HD and binders, will need monitoring as outpt.  5.  HTN - BP under good control after addition of minoxidil, continue current antihypertensives.   LOS: 9 Anani Gu,  Shaia Porath 5/30/201611:16 AM

## 2014-10-29 NOTE — Clinical Social Work Note (Signed)
CSW spoke with nephrology and patient will now be deemed ESRD and will need outpatient dialysis arranged. Patient still on schedule to discharge when time to [facility].  Shela Leff MSW,LCSWA [phone]

## 2014-10-30 LAB — BASIC METABOLIC PANEL
Anion gap: 9 (ref 5–15)
BUN: 52 mg/dL — ABNORMAL HIGH (ref 6–20)
CHLORIDE: 104 mmol/L (ref 101–111)
CO2: 27 mmol/L (ref 22–32)
Calcium: 8.5 mg/dL — ABNORMAL LOW (ref 8.9–10.3)
Creatinine, Ser: 7.62 mg/dL — ABNORMAL HIGH (ref 0.61–1.24)
GFR calc Af Amer: 8 mL/min — ABNORMAL LOW (ref >60)
GFR calc non Af Amer: 7 mL/min — ABNORMAL LOW (ref >60)
Glucose, Bld: 90 mg/dL (ref 65–99)
Potassium: 4.7 mmol/L (ref 3.5–5.1)
SODIUM: 140 mmol/L (ref 135–145)

## 2014-10-30 LAB — PLATELET COUNT: Platelets: 192 10*3/uL (ref 150–440)

## 2014-10-30 LAB — PHOSPHORUS: Phosphorus: 4.4 mg/dL (ref 2.5–4.6)

## 2014-10-30 NOTE — Progress Notes (Signed)
This note also relates to the following rows which could not be included: Resp - Cannot attach notes to unvalidated device data BP - Cannot attach notes to unvalidated device data   Post hd tx

## 2014-10-30 NOTE — Clinical Social Work Note (Signed)
Waiting chair conformation from [place].  Clinic Selection is [place]  ([facility])

## 2014-10-30 NOTE — Progress Notes (Signed)
Subjective:  Pt due for HD this AM. Pt resting comfortably, no acute complaints this AM.  Objective:  Vital signs in last 24 hours:  Temp:  [97.5 F (36.4 C)-98.3 F (36.8 C)] 97.9 F (36.6 C) (05/31 1101) Pulse Rate:  [56-66] 66 (05/31 1101) Resp:  [16-19] 16 (05/31 0844) BP: (91-133)/(51-78) 91/51 mmHg (05/31 1101) SpO2:  [100 %] 100 % (05/31 1101) Weight:  [84.097 kg (185 lb 6.4 oz)] 84.097 kg (185 lb 6.4 oz) (05/31 0643)  Weight change:  Filed Weights   10/27/14 1911 10/27/14 1925 10/30/14 0643  Weight: 78.5 kg (173 lb 1 oz) 78.835 kg (173 lb 12.8 oz) 84.097 kg (185 lb 6.4 oz)    Intake/Output: I/O last 3 completed shifts: In: 600 [P.O.:600] Out: 1200 [Urine:1200]     Physical Exam: General: NAD laying in bed  HEENT anicteric  Neck supple  Pulm/lungs Clear bilateral, normal effort  CVS/Heart Regular, no rub  Abdomen:  Soft, NTND BS present  Extremities: No edema  Neurologic: Alert, follows commands, no focal deficits  Skin: No acute rashes  Access: Rt IJ PC. 5.23.16. Dr Lucky Cowboy       Basic Metabolic Panel:  Recent Labs Lab 10/26/14 [phone] 10/27/14 0812 10/28/14 0606 10/29/14 0754 10/30/14 0524  NA 141 141 140 144 140  K 3.8 4.6 3.9 4.6 4.7  CL 105 106 103 107 104  CO2 27 24 31 29 27   GLUCOSE 67 106* 152* 96 90  BUN 42* 59* 30* 41* 52*  CREATININE 6.57* 7.77* 4.77* 6.37* 7.62*  CALCIUM 8.4* 8.5* 8.3* 8.5* 8.5*     CBC:  Recent Labs Lab 10/26/14 0628 10/30/14 0524  WBC 9.0  --   HGB 7.6*  --   HCT 23.0*  --   MCV 94.6  --   PLT 176 192      Microbiology: No results found for this or any previous visit.  Coagulation Studies: No results for input(s): LABPROT, INR in the last 72 hours.  Urinalysis: No results for input(s): COLORURINE, LABSPEC, PHURINE, GLUCOSEU, HGBUR, BILIRUBINUR, KETONESUR, PROTEINUR, UROBILINOGEN, NITRITE, LEUKOCYTESUR in the last 72 hours.  Invalid input(s): APPERANCEUR    Imaging: No results  found.   Medications:     . amLODipine  10 mg Oral QHS  . atenolol  25 mg Oral BID  . calcium acetate  1,334 mg Oral TID WC  . carbamazepine  500 mg Oral BID  . cholecalciferol  1,000 Units Oral Daily  . citalopram  20 mg Oral Daily  . cloNIDine  0.2 mg Oral TID  . docusate sodium  100 mg Oral BID  . ferrous fumarate  1 tablet Oral Daily  . fluPHENAZine  2.5 mg Oral Daily  . heparin  5,000 Units Subcutaneous 3 times per day  . hydrALAZINE  100 mg Oral 4 times per day  . ipratropium-albuterol  3 mL Nebulization QHS  . irbesartan  300 mg Oral Daily  . minoxidil  5 mg Oral Daily  . nitroGLYCERIN  2 inch Topical BID  . pantoprazole  40 mg Oral Daily  . sodium chloride  3 mL Intravenous Q12H  . trihexyphenidyl  5 mg Oral TID WC   acetaminophen **OR** acetaminophen, hydrALAZINE, magnesium hydroxide, ondansetron **OR** ondansetron (ZOFRAN) IV  Assessment/ Plan:  54 y.o. male with schizophrenia, hypertension, hepatitis C, tobacco abuse who was admitted to [facility] on 10/20/2014 for acute renal failure versus end stage renal disease, anemia, shortness of breath and edema.   1.  End  stage renal Disease : patient presented with BUN of 100 and eGFR of 5.  -  Markedly echogenic kidneys. More c/w ESRD -  Pt due for HD today, orders have been prepared, care management to assist with placement into outpt unit.  2. Severe metabolic acidosis.  - resolved with dialysis.  3. Anemia of chronic kidney disease: 2 units PRBC this admission -BP under better control now, ok to start epogen, will consider this with next HD.  4. Secondary Hyperparathyroidism: hypocalcemia consistent with SPTH of renal origin -check phos today with HD.  5.  HTN - continue atenolol, clonidine, hydralazine, ibesartan, and minoxidil.   LOS: [place], Haani Bakula 5/31/201611:04 AM

## 2014-10-30 NOTE — Progress Notes (Signed)
HD tx start 

## 2014-10-30 NOTE — Progress Notes (Signed)
[facility] [place] NAME: Deniel Blakeman    MR#:  [id number]  DATE OF BIRTH:  Nov 04, 1960  SUBJECTIVE:   - doing well, no complaints. For dialysis today  REVIEW OF SYSTEMS:  ROS Constitutional: Negative for fever and chills.  Respiratory: Negative for cough, shortness of breath and wheezing.  Complains of orthopnea Cardiovascular: chest pain at night present and no palpitations.  Gastrointestinal: Negative for nausea, vomiting, abdominal pain, diarrhea and constipation.  Genitourinary: Negative for dysuria.  Neurological: Negative for dizziness, seizures and headaches.    DRUG ALLERGIES:   Allergies  Allergen Reactions  . Thorazine [Chlorpromazine] Other (See Comments)    Reaction:  Unknown     VITALS:  Blood pressure 133/69, pulse 56, temperature 97.5 F (36.4 C), temperature source Oral, resp. rate 16, height 5\' 9"  (1.753 m), weight 84.097 kg (185 lb 6.4 oz), SpO2 100 %.  PHYSICAL EXAMINATION:  Physical Exam  GENERAL:  54 y.o.-year-old patient lying in the bed with no acute distress.  EYES: Pupils equal, round, reactive to light and accommodation. No scleral icterus. Extraocular muscles intact.  HEENT: Head atraumatic, normocephalic. Oropharynx and nasopharynx clear.  NECK:  Supple, no jugular venous distention. No thyroid enlargement, no tenderness.  LUNGS: Normal breath sounds bilaterally, no wheezing, ,rhonchi . No use of accessory muscles of respiration. Decreased basilar breath sounds .  CARDIOVASCULAR: S1, S2 normal. Systolic murmur Present, no rubs, or gallops.  ABDOMEN: Soft, nontender, nondistended. Bowel sounds present. No organomegaly or mass.  EXTREMITIES: No pedal edema, no cyanosis, or clubbing.  NEUROLOGIC: Cranial nerves II through XII are intact. Muscle strength 5/5 in all extremities. Sensation intact. Gait not checked.  PSYCHIATRIC: The patient is alert and oriented x 3. Blunt affect. SKIN: No  obvious rash, lesion, or ulcer.    LABORATORY PANEL:   CBC  Recent Labs Lab 10/26/14 0628 10/30/14 0524  WBC 9.0  --   HGB 7.6*  --   HCT 23.0*  --   PLT 176 192   ------------------------------------------------------------------------------------------------------------------  Chemistries   Recent Labs Lab 10/30/14 0524  NA 140  K 4.7  CL 104  CO2 27  GLUCOSE 90  BUN 52*  CREATININE 7.62*  CALCIUM 8.5*   ------------------------------------------------------------------------------------------------------------------  Cardiac Enzymes No results for input(s): TROPONINI in the last 168 hours. ------------------------------------------------------------------------------------------------------------------  RADIOLOGY:  No results found.  EKG:  No orders found for this or any previous visit.  ASSESSMENT AND PLAN:   54 year old male with Past medical history significant for CKD with baseline creatinine of 3, hypertension, schizophrenia, hepatitis C presents to the hospital from [facility] ER secondary to acute on chronic kidney disease.  #1 Acute kidney injury on CKD-baseline creatinine of 3,  But creatinine greater than 12 on admission.  - CKD progress to end-stage renal disease -  Creatinine is improving with dialysis. 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 -Appreciate nephrology and vascular consults.  - Patient has right chest permacath placed -PPD and hepatitis B are negative. - Education officer, museum for outpt dialysis set up and patient can be discharged  #2 acute on chronic anemia-patient has likely anemia of chronic disease from his CKD. However hemoglobin at 5.2 on admission and received 2 units packed RBC. Hemoglobin is stable around 7.5.  - Epogen with dialysis recommended  #3 hypertension-malignant hypertension. IV hydralazine when necessary.  Likely very elevated blood pressure due to the right renal artery  stenosis.  - BP much better now -  on high doses of Norvasc, hydralazine, clonidine, atenolol bid and irbesartan. - minoxidil added by nephrology with good results today -  Renal angiogram by vascular as an outpatient for his right renal artery stenosis.  #4 right renal artery stenosis-noted on renal ultrasound. At this point there is no arterial duplex in this hospital, appreciate vascular input. After renal function is stabilized on dialysis, patient will need an angiogram of the right renal artery. Check with vascular about doing the renal artery angiogram with her as an inpatient or outpatient.   #5 schizophrenia-stable at this time. Continue home medications. Patient on Prolixin, Artane and Celexa and Tegretol.  #6 DVT prophylaxis-on subcutaneous heparin.  Physical therapy recommended home health. Discussed with Dr. Holley Raring.  All the records are reviewed and case discussed with Care Management/Social Workerr. Management plans discussed with the patient, family and they are in agreement.  CODE STATUS: Full code  TOTAL TIME TAKING CARE OF THIS PATIENT: 40 minutes.   CONSIDERED ESRD, OUTPATIENT DIALYSIS NEED TO BE SETUP.  Gladstone Lighter M.D on 10/30/2014 at 11:00 AM  Between 7am to 6pm - Pager - [phone]  After 6pm go to [email or web address] - password [email or web address] [facility]  Office  [phone]  CC: Primary care physician; Rosita Fire, MD

## 2014-10-30 NOTE — Progress Notes (Signed)
HD tx completed.

## 2014-10-30 NOTE — Progress Notes (Signed)
Pre-hd tx 

## 2014-10-30 NOTE — Progress Notes (Signed)
Physical Therapy Treatment Patient Details Name: SIONE MEINHOLZ MRN: [id number] DOB: 1960-08-05 Today's Date: 10/30/2014    History of Present Illness Pt is admitted for SOB and leg swelling. Pt with history of CKD, schizophrenia, and Hep C. Pt with permcath placed on 10/22/14.     PT Comments    Pt showing some improvement with less scissoring and episodes of LOB with ambulation. Pt continues to be quick and impulsive causing some safety issues with dynamic stand activities. Pt demonstrates some difficulty following instructions during stand exercises.   Follow Up Recommendations  Supervision/Assistance - 24 hour;Home health PT     Equipment Recommendations       Recommendations for Other Services       Precautions / Restrictions Precautions Precautions: Fall Restrictions Weight Bearing Restrictions: No    Mobility  Bed Mobility Overal bed mobility: Modified Independent                Transfers Overall transfer level: Modified independent Equipment used: None Transfers: Sit to/from Stand Sit to Stand: Min guard         General transfer comment: Impulsive, unsafe  Ambulation/Gait Ambulation/Gait assistance: Min guard;Min assist Ambulation Distance (Feet): 370 Feet Assistive device: None Gait Pattern/deviations: Step-through pattern (3 episodes of scissoring; occasional staggering R/L) Gait velocity: quick Gait velocity interpretation: >2.62 ft/sec, indicative of independent community ambulator General Gait Details:  (3 episodes of scissoring with LOB requiring Min A )   Stairs            Wheelchair Mobility    Modified Rankin (Stroke Patients Only)       Balance Overall balance assessment: Needs assistance         Standing balance support: Single extremity supported Standing balance-[name]: Good Standing balance comment: Performs stand LE exercises with need for 1 UE support 50% of the time                    Cognition  Arousal/Alertness: Awake/alert Behavior During Therapy: WFL for tasks assessed/performed                        Exercises General Exercises - Lower Extremity Hip ABduction/ADduction: AROM;Both;10 reps;Standing Straight Leg Raises: AROM;Both;10 reps;Standing (flexion and extension) Hip Flexion/Marching: AROM;Both;10 reps;Standing    General Comments        Pertinent Vitals/Pain Pain Assessment: No/denies pain    Home Living                      Prior Function            PT Goals (current goals can now be found in the care plan section) Progress towards PT goals: Progressing toward goals    Frequency  Min 2X/week    PT Plan Current plan remains appropriate    Co-evaluation             End of Session Equipment Utilized During Treatment: Gait belt Activity Tolerance: Patient tolerated treatment well Patient left: in bed;with call bell/phone within reach;with bed alarm set     Time: [id number] PT Time Calculation (min) (ACUTE ONLY): 21 min  Charges:  $Gait Training: 8-22 mins                    G Codes:      Charlaine Dalton 10/30/2014, 11:48 AM

## 2014-10-31 ENCOUNTER — Encounter: Payer: Self-pay | Admitting: Internal Medicine

## 2014-10-31 MED ORDER — CARBAMAZEPINE 200 MG PO TABS: 500.0000 mg | ORAL_TABLET | Freq: Two times a day (BID) | ORAL | Status: DC

## 2014-10-31 MED ORDER — IRBESARTAN 300 MG PO TABS: 300.0000 mg | ORAL_TABLET | Freq: Every day | ORAL | Status: DC

## 2014-10-31 MED ORDER — ATENOLOL 25 MG PO TABS: 25.0000 mg | ORAL_TABLET | Freq: Two times a day (BID) | ORAL | Status: DC

## 2014-10-31 MED ORDER — CLONIDINE HCL 0.2 MG PO TABS: 0.2000 mg | ORAL_TABLET | Freq: Three times a day (TID) | ORAL | Status: DC

## 2014-10-31 MED ORDER — CITALOPRAM HYDROBROMIDE 20 MG PO TABS: 20.0000 mg | ORAL_TABLET | Freq: Every day | ORAL | Status: DC

## 2014-10-31 MED ORDER — MINOXIDIL 2.5 MG PO TABS: 5.0000 mg | ORAL_TABLET | Freq: Every day | ORAL | Status: DC

## 2014-10-31 MED ORDER — HYDRALAZINE HCL 100 MG PO TABS: 100.0000 mg | ORAL_TABLET | Freq: Four times a day (QID) | ORAL | Status: DC

## 2014-10-31 MED ORDER — CALCIUM ACETATE (PHOS BINDER) 667 MG PO CAPS: 1334.0000 mg | ORAL_CAPSULE | Freq: Three times a day (TID) | ORAL | Status: DC

## 2014-10-31 MED ORDER — AMLODIPINE BESYLATE 10 MG PO TABS: 10.0000 mg | ORAL_TABLET | Freq: Every day | ORAL | Status: DC

## 2014-10-31 NOTE — Progress Notes (Signed)
Nutrition Follow-up  DOCUMENTATION CODES:     INTERVENTION: Meals and Snacks: Cater to patient preferences Education: Pt out of room for dialysis this am. RD left written materials for nutrition education for patients starting on HD on tray table in pt room along with recommended Grocery List. RD left materials with 'Dialysis Diet' paperwork from [facility] provided to pt previously by another discipline on tray table in pt room. Will verbally educate on follow.  NUTRITION DIAGNOSIS:  Inadequate oral intake related to other (see comment) (procedure pending) as evidenced by NPO status; improved as pt on solid food diet order  GOAL:  Patient will meet greater than or equal to 90% of their needs; ongoing  MONITOR:   (Electrolyte and renal profile, energy intake)  REASON FOR ASSESSMENT:   (Follow-Up)    ASSESSMENT:  Pt receiving HD this am. Per MD note, pt will continue on HD outpatient after discharge. Per RN Estill Batten pt likely to discharge today after HD to a family care home.  PO Intake: Limited documentation of po intake. Last recorded 20% on 5/29. RD notes 2 empty cranberry juice cartons in pt room on visit.  Medications: Phoslo, Vitamin D, Colace, Protonix Labs: Electrolyte and Renal Profile:  Recent Labs Lab 10/28/14 0606 10/29/14 0754 10/30/14 0524 10/30/14 1347  BUN 30* 41* 52*  --   CREATININE 4.77* 6.37* 7.62*  --   NA 140 144 140  --   K 3.9 4.6 4.7  --   PHOS  --   --   --  4.4   Weight trend since admission: Filed Weights   10/30/14 1320 10/30/14 1707 10/31/14 0549  Weight: 182 lb 12.2 oz (82.9 kg) 180 lb 12.4 oz (82 kg) 184 lb 8.4 oz (83.7 kg)    BMI:  Body mass index is 27.24 kg/(m^2).  Skin:  Reviewed, no issues  Diet Order:  Diet 2 gram sodium Room service appropriate?: Yes; Fluid consistency:: Thin  EDUCATION NEEDS:  Education needs addressed   Intake/Output Summary (Last 24 hours) at 10/31/14 1047 Last data filed at 10/31/14 0757  Gross  per 24 hour  Intake      0 ml  Output   2250 ml  Net  -2250 ml    Last BM:  5/31  LOW Care Level  Dwyane Luo, RD, LDN Pager [phone]

## 2014-10-31 NOTE — Discharge Summary (Signed)
[facility] at [place] NAME: Todd Brown    MR#:  [id number]  DATE OF BIRTH:  1960/09/23  DATE OF ADMISSION:  10/20/2014 ADMITTING PHYSICIAN: Henreitta Leber, MD  DATE OF DISCHARGE: 10/31/14  PRIMARY CARE PHYSICIAN: Rosita Fire, MD    ADMISSION DIAGNOSIS:  Acute on chronic renal faliure, anemia ESRD  DISCHARGE DIAGNOSIS:  Principal Problem:   Acute kidney injury   SECONDARY DIAGNOSIS:   Past Medical History  Diagnosis Date  . ESRD (end stage renal disease)   . Malignant hypertension   . Schizophrenia   . Anemia   . Renal artery stenosis     HOSPITAL COURSE:   54 year old male with Past medical history significant for CKD with baseline creatinine of 3, hypertension, schizophrenia, hepatitis C presents to the hospital from [facility] ER secondary to acute on chronic kidney disease.  #1 Acute kidney injury on CKD-baseline creatinine of 3, But creatinine greater than 12 on admission.  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 -Appreciate nephrology and vascular consults.  - Patient has right chest permacath placed -PPD and hepatitis B are negative. - Outpatient dialysis set up for Monday Wednesday Friday schedule at [facility] on [address] at noon.  #2 acute on chronic anemia-patient has likely anemia of chronic disease from his CKD. However hemoglobin at 5.2 on admission and received 2 units packed RBC. Hemoglobin is stable around 7.5.  - Epogen with dialysis recommended  #3 hypertension-malignant hypertension. IV hydralazine when necessary.  Likely very elevated blood pressure due to the right renal artery stenosis.  - BP much better now -on high doses of Norvasc, hydralazine, clonidine, atenolol bid and irbesartan. - minoxidil added by nephrology with good results today - Renal angiogram by vascular as an outpatient for his right  renal artery stenosis.  #4 right renal artery stenosis-noted on renal ultrasound. At this point there is no arterial duplex in this hospital, appreciate vascular input.  - outpatient f/u recommended   #5 schizophrenia-stable at this time. Continue home medications. Patient on Prolixin, Artane and Celexa and Tegretol.  Patient cannot go to family care home as per previous plan due to Medicaid issues. Patient will be discharged to his apartment. Act team will follow up closely.  DISCHARGE CONDITIONS:  Stable  CONSULTS OBTAINED:     DRUG ALLERGIES:   Allergies  Allergen Reactions  . Thorazine [Chlorpromazine] Other (See Comments)    Reaction:  Unknown     DISCHARGE MEDICATIONS:   Current Discharge Medication List    START taking these medications   Details  calcium acetate (PHOSLO) 667 MG capsule Take 2 capsules (1,334 mg total) by mouth 3 (three) times daily with meals. Qty: 60 capsule, Refills: 1    cloNIDine (CATAPRES) 0.2 MG tablet Take 1 tablet (0.2 mg total) by mouth 3 (three) times daily. Qty: 90 tablet, Refills: 1    hydrALAZINE (APRESOLINE) 100 MG tablet Take 1 tablet (100 mg total) by mouth every 6 (six) hours. Qty: 120 tablet, Refills: 1    irbesartan (AVAPRO) 300 MG tablet Take 1 tablet (300 mg total) by mouth daily. Qty: 30 tablet, Refills: 1    minoxidil (LONITEN) 2.5 MG tablet Take 2 tablets (5 mg total) by mouth daily. Qty: 60 tablet, Refills: 1      CONTINUE these medications which have CHANGED   Details  amLODipine (NORVASC) 10 MG tablet Take 1 tablet (10 mg total) by  mouth at bedtime. Qty: 30 tablet, Refills: 1    atenolol (TENORMIN) 25 MG tablet Take 1 tablet (25 mg total) by mouth 2 (two) times daily. Qty: 60 tablet, Refills: 1    carbamazepine (TEGRETOL) 200 MG tablet Take 2.5 tablets (500 mg total) by mouth 2 (two) times daily. Taken 2 1/2 tabs (500mg  ) twice a day Qty: 150 tablet, Refills: 0    citalopram (CELEXA) 20 MG tablet Take 1  tablet (20 mg total) by mouth daily. Qty: 30 tablet, Refills: 1      CONTINUE these medications which have NOT CHANGED   Details  cholecalciferol (VITAMIN D) 1000 UNITS tablet Take 1,000 Units by mouth daily.    fluPHENAZine (PROLIXIN) 2.5 MG tablet Take 2.5 mg by mouth daily.    fluPHENAZine decanoate (PROLIXIN) 25 MG/ML injection Inject 37.5 mg into the muscle every 14 (fourteen) days.    omeprazole (PRILOSEC) 20 MG capsule Take 20 mg by mouth daily.    traMADol (ULTRAM) 50 MG tablet Take 50 mg by mouth every 6 (six) hours as needed for pain.    trihexyphenidyl (ARTANE) 5 MG tablet Take 5 mg by mouth 3 (three) times daily with meals.    Fe Fum-FA-B Cmp-C-Zn-Mg-Mn-Cu (FERROCITE PLUS PO) Take by mouth daily.      STOP taking these medications     peg 3350 powder (MOVIPREP) 100 G SOLR          DISCHARGE INSTRUCTIONS:   #1 Follow-up with [facility] on [address] at noon on Monday Wednesday Friday #2 nephrology follow-up in one week #3 PCP follow-up within 1 week #4 psychiatric is follow-up in 1-2 weeks.  If you experience worsening of your admission symptoms, develop shortness of breath, life threatening emergency, suicidal or homicidal thoughts you must seek medical attention immediately by calling 911 or calling your MD immediately  if symptoms less severe.  You Must read complete instructions/literature along with all the possible adverse reactions/side effects for all the Medicines you take and that have been prescribed to you. Take any new Medicines after you have completely understood and accept all the possible adverse reactions/side effects.   Please note  You were cared for by a hospitalist during your hospital stay. If you have any questions about your discharge medications or the care you received while you were in the hospital after you are discharged, you can call the unit and asked to speak with the hospitalist on call if the hospitalist that took  care of you is not available. Once you are discharged, your primary care physician will handle any further medical issues. Please note that NO REFILLS for any discharge medications will be authorized once you are discharged, as it is imperative that you return to your primary care physician (or establish a relationship with a primary care physician if you do not have one) for your aftercare needs so that they can reassess your need for medications and monitor your lab values.    Today   CHIEF COMPLAINT:  No chief complaint on file.   VITAL SIGNS:  Blood pressure 125/68, pulse 60, temperature 98 F (36.7 C), temperature source Oral, resp. rate 18, height 5\' 9"  (1.753 m), weight 80.9 kg (178 lb 5.6 oz), SpO2 100 %.  I/O:   Intake/Output Summary (Last 24 hours) at 10/31/14 1426 Last data filed at 10/31/14 1239  Gross per 24 hour  Intake      0 ml  Output   2050 ml  Net  -  2050 ml    PHYSICAL EXAMINATION:   Physical Exam   GENERAL: 54 y.o.-year-old patient lying in the bed with no acute distress.  EYES: Pupils equal, round, reactive to light and accommodation. No scleral icterus. Extraocular muscles intact.  HEENT: Head atraumatic, normocephalic. Oropharynx and nasopharynx clear.  NECK: Supple, no jugular venous distention. No thyroid enlargement, no tenderness.  LUNGS: Normal breath sounds bilaterally, no wheezing, ,rhonchi . No use of accessory muscles of respiration. Decreased basilar breath sounds . CARDIOVASCULAR: S1, S2 normal. Systolic murmur Present, no rubs, or gallops.  ABDOMEN: Soft, nontender, nondistended. Bowel sounds present. No organomegaly or mass.  EXTREMITIES: No pedal edema, no cyanosis, or clubbing.  NEUROLOGIC: Cranial nerves II through XII are intact. Muscle strength 5/5 in all extremities. Sensation intact. Gait not checked.  PSYCHIATRIC: The patient is alert and oriented x 3. Blunt affect. SKIN: No obvious rash, lesion, or ulcer.   DATA REVIEW:    CBC  Recent Labs Lab 10/26/14 0628 10/30/14 0524  WBC 9.0  --   HGB 7.6*  --   HCT 23.0*  --   PLT 176 192    Chemistries   Recent Labs Lab 10/30/14 0524  NA 140  K 4.7  CL 104  CO2 27  GLUCOSE 90  BUN 52*  CREATININE 7.62*  CALCIUM 8.5*    Cardiac Enzymes No results for input(s): TROPONINI in the last 168 hours.  Microbiology Results  No results found for this or any previous visit.  RADIOLOGY:  No results found.  EKG:  No orders found for this or any previous visit.    Management plans discussed with the patient, family and they are in agreement.  CODE STATUS:     Code Status Orders        Start     Ordered   10/20/14 2102  Full code   Continuous     10/20/14 2101      TOTAL TIME TAKING CARE OF THIS PATIENT: 40 minutes.    Gladstone Lighter M.D on 10/31/2014 at 2:26 PM  Between 7am to 6pm - Pager - [phone]  After 6pm go to [email or web address] - password [email or web address] [place] [facility]  Office  [phone]  CC: Primary care physician; Rosita Fire, MD

## 2014-10-31 NOTE — Progress Notes (Signed)
Pt d/c. Instructions and education discussed with pt and caregiver. IV removed. Questions answered. Pt escorted out.

## 2014-10-31 NOTE — Progress Notes (Signed)
Subjective:  Pt has a secured HD seat at [facility]. Will be on MWF schedule. Therefore will perform short HD treatment today.  Objective:  Vital signs in last 24 hours:  Temp:  [97.6 F (36.4 C)-98.5 F (36.9 C)] 98.1 F (36.7 C) (06/01 1025) Pulse Rate:  [52-74] 74 (06/01 1025) Resp:  [10-20] 18 (06/01 1025) BP: (105-139)/(55-77) 133/75 mmHg (06/01 0757) SpO2:  [92 %-100 %] 100 % (06/01 1025) Weight:  [82 kg (180 lb 12.4 oz)-83.7 kg (184 lb 8.4 oz)] 82 kg (180 lb 12.4 oz) (06/01 1025)  Weight change: -1.197 kg (-2 lb 10.2 oz) Filed Weights   10/30/14 1707 10/31/14 0549 10/31/14 1025  Weight: 82 kg (180 lb 12.4 oz) 83.7 kg (184 lb 8.4 oz) 82 kg (180 lb 12.4 oz)    Intake/Output: I/O last 3 completed shifts: In: 240 [P.O.:240] Out: 2250 [Urine:1250; Other:1000]     Physical Exam: General: NAD laying in bed  HEENT anicteric  Neck supple  Pulm/lungs Clear bilateral, normal effort  CVS/Heart Regular, no rub  Abdomen:  Soft, NTND BS present  Extremities: No edema  Neurologic: Alert, follows commands, no focal deficits  Skin: No acute rashes  Access: Rt IJ PC. 5.23.16. Dr Lucky Cowboy       Basic Metabolic Panel:  Recent Labs Lab 10/26/14 [phone] 10/27/14 6294 10/28/14 0606 10/29/14 0754 10/30/14 0524 10/30/14 1347  NA 141 141 140 144 140  --   K 3.8 4.6 3.9 4.6 4.7  --   CL 105 106 103 107 104  --   CO2 27 24 31 29 27   --   GLUCOSE 67 106* 152* 96 90  --   BUN 42* 59* 30* 41* 52*  --   CREATININE 6.57* 7.77* 4.77* 6.37* 7.62*  --   CALCIUM 8.4* 8.5* 8.3* 8.5* 8.5*  --   PHOS  --   --   --   --   --  4.4     CBC:  Recent Labs Lab 10/26/14 0628 10/30/14 0524  WBC 9.0  --   HGB 7.6*  --   HCT 23.0*  --   MCV 94.6  --   PLT 176 192      Microbiology: No results found for this or any previous visit.  Coagulation Studies: No results for input(s): LABPROT, INR in the last 72 hours.  Urinalysis: No results for input(s): COLORURINE, LABSPEC,  PHURINE, GLUCOSEU, HGBUR, BILIRUBINUR, KETONESUR, PROTEINUR, UROBILINOGEN, NITRITE, LEUKOCYTESUR in the last 72 hours.  Invalid input(s): APPERANCEUR    Imaging: No results found.   Medications:     . amLODipine  10 mg Oral QHS  . atenolol  25 mg Oral BID  . calcium acetate  1,334 mg Oral TID WC  . carbamazepine  500 mg Oral BID  . cholecalciferol  1,000 Units Oral Daily  . citalopram  20 mg Oral Daily  . cloNIDine  0.2 mg Oral TID  . docusate sodium  100 mg Oral BID  . ferrous fumarate  1 tablet Oral Daily  . fluPHENAZine  2.5 mg Oral Daily  . heparin  5,000 Units Subcutaneous 3 times per day  . hydrALAZINE  100 mg Oral 4 times per day  . ipratropium-albuterol  3 mL Nebulization QHS  . irbesartan  300 mg Oral Daily  . minoxidil  5 mg Oral Daily  . nitroGLYCERIN  2 inch Topical BID  . pantoprazole  40 mg Oral Daily  . sodium chloride  3 mL Intravenous Q12H  .  trihexyphenidyl  5 mg Oral TID WC   acetaminophen **OR** acetaminophen, hydrALAZINE, magnesium hydroxide, ondansetron **OR** ondansetron (ZOFRAN) IV  Assessment/ Plan:  54 y.o. male with schizophrenia, hypertension, hepatitis C, tobacco abuse who was admitted to [facility] on 10/20/2014 for acute renal failure versus end stage renal disease, anemia, shortness of breath and edema.   1.  End stage renal Disease : patient presented with BUN of 100 and eGFR of 5.  -  Markedly echogenic kidneys. More c/w ESRD -  Pt will be going to [place] for chronic HD, will be on MWF schedule, therefore will perform short HD treatment today.    2. Severe metabolic acidosis.  - resolved with dialysis.  3. Anemia of chronic kidney disease: 2 units PRBC this admission -will receive epogen therapy as outpt.  4. Secondary Hyperparathyroidism: hypocalcemia consistent with SPTH of renal origin -phos 4.4, will continue to monitor trend.  5.  HTN - continue atenolol, clonidine, hydralazine, ibesartan, and minoxidil, BP under excellent  control now.   LOS: 11 Todd Brown 6/1/201611:07 AM

## 2014-10-31 NOTE — Clinical Social Work Note (Signed)
Todd Brown with [facility] has informed CSW after already accepting patient and already knowing he would be ready for discharge that they cannot take patient afterall because he no longer has special assistance medicaid and would have to reapply and this would take at minimum 45 days. Todd Brown stated that their owner was not willing to have to wait to get reimbursed and thus cannot take patient.   Patient will return home to his previous living arrangement and CSW has contacted Janett Billow at the ACT team and they will start coming out to his home daily again and will transport him home today. Janett Billow also stated that they would transport patient to and from dialysis. CSW was able to make arrangements for patient's outpatient dialysis with Jackelyn Poling at [facility] on [address] for MWF at 12:00pm. His start date will be this Friday at 69. CSW has informed Janett Billow with the ACT team of this dialysis schedule.   Patient and patient's mother updated regarding change of events.  Shela Leff MSW,LCSWA [phone]

## 2014-10-31 NOTE — Progress Notes (Signed)
[place] [facility] [place] NAME: Todd Brown    MR#:  [id number]  DATE OF BIRTH:  May 02, 1961  SUBJECTIVE:   No complaints today. Patient Secured a spot at outpatient dialysis center on the Monday Wednesday Friday schedule. -So a quick dialysis treatment was done today to put him on this schedule. -He was very excited to note that he was being discharged home.  REVIEW OF SYSTEMS:  ROS Constitutional: Negative for fever and chills.  Respiratory: Negative for cough, shortness of breath and wheezing.  Complains of orthopnea Cardiovascular: chest pain at night present and no palpitations.  Gastrointestinal: Negative for nausea, vomiting, abdominal pain, diarrhea and constipation.  Genitourinary: Negative for dysuria.  Neurological: Negative for dizziness, seizures and headaches.    DRUG ALLERGIES:   Allergies  Allergen Reactions  . Thorazine [Chlorpromazine] Other (See Comments)    Reaction:  Unknown     VITALS:  Blood pressure 125/68, pulse 60, temperature 98 F (36.7 C), temperature source Oral, resp. rate 18, height 5\' 9"  (1.753 m), weight 80.9 kg (178 lb 5.6 oz), SpO2 100 %.  PHYSICAL EXAMINATION:  Physical Exam  GENERAL:  54 y.o.-year-old patient lying in the bed with no acute distress.  EYES: Pupils equal, round, reactive to light and accommodation. No scleral icterus. Extraocular muscles intact.  HEENT: Head atraumatic, normocephalic. Oropharynx and nasopharynx clear.  NECK:  Supple, no jugular venous distention. No thyroid enlargement, no tenderness.  LUNGS: Normal breath sounds bilaterally, no wheezing, ,rhonchi . No use of accessory muscles of respiration. Decreased basilar breath sounds .  CARDIOVASCULAR: S1, S2 normal. Systolic murmur Present, no rubs, or gallops.  ABDOMEN: Soft, nontender, nondistended. Bowel sounds present. No organomegaly or mass.  EXTREMITIES: No pedal edema, no cyanosis, or clubbing.   NEUROLOGIC: Cranial nerves II through XII are intact. Muscle strength 5/5 in all extremities. Sensation intact. Gait not checked.  PSYCHIATRIC: The patient is alert and oriented x 3. Blunt affect. SKIN: No obvious rash, lesion, or ulcer.    LABORATORY PANEL:   CBC  Recent Labs Lab 10/26/14 0628 10/30/14 0524  WBC 9.0  --   HGB 7.6*  --   HCT 23.0*  --   PLT 176 192   ------------------------------------------------------------------------------------------------------------------  Chemistries   Recent Labs Lab 10/30/14 0524  NA 140  K 4.7  CL 104  CO2 27  GLUCOSE 90  BUN 52*  CREATININE 7.62*  CALCIUM 8.5*   ------------------------------------------------------------------------------------------------------------------  Cardiac Enzymes No results for input(s): TROPONINI in the last 168 hours. ------------------------------------------------------------------------------------------------------------------  RADIOLOGY:  No results found.  EKG:  No orders found for this or any previous visit.  ASSESSMENT AND PLAN:   54 year old male with Past medical history significant for CKD with baseline creatinine of 3, hypertension, schizophrenia, hepatitis C presents to the hospital from [facility] ER secondary to acute on chronic kidney disease.  #1 Acute kidney injury on CKD-baseline creatinine of 3, But creatinine greater than 12 on admission.  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 -Appreciate nephrology and vascular consults.  - Patient has right chest permacath placed -PPD and hepatitis B are negative. - Outpatient dialysis set up for Monday Wednesday Friday schedule at [facility] on [address] at noon.  #2 acute on chronic anemia-patient has likely anemia of chronic disease from his CKD. However hemoglobin at 5.2 on admission and received 2 units packed RBC. Hemoglobin is stable  around 7.5.  -  Epogen with dialysis recommended  #3 hypertension-malignant hypertension. IV hydralazine when necessary.  Likely very elevated blood pressure due to the right renal artery stenosis.  - BP much better now -on high doses of Norvasc, hydralazine, clonidine, atenolol bid and irbesartan. - minoxidil added by nephrology with good results today -  Renal angiogram by vascular as an outpatient for his right renal artery stenosis.  #4 right renal artery stenosis-noted on renal ultrasound. At this point there is no arterial duplex in this hospital, appreciate vascular input.  - outpatient f/u recommended   #5 schizophrenia-stable at this time. Continue home medications. Patient on Prolixin, Artane and Celexa and Tegretol.  #6 DVT prophylaxis-on subcutaneous heparin.  All the records are reviewed and case discussed with Care Management/Social Workerr. Management plans discussed with the patient, family and they are in agreement.  CODE STATUS: Full code  TOTAL TIME TAKING CARE OF THIS PATIENT: 40 minutes.   DISCHARGE HOME TODAY  Dalayza Zambrana M.D on 10/31/2014 at 2:13 PM  Between 7am to 6pm - Pager - [phone]  After 6pm go to [email or web address] - password [email or web address] [facility]  Office  [phone]  CC: Primary care physician; Rosita Fire, MD

## 2014-10-31 NOTE — Progress Notes (Signed)
Patient tolerated 2hr short tx without complications. 500 ml fluid removal. Catheter dressing chnaged; no signs of infection noted.  Education handed out on dialysis diet Also instructed patient on catheter care: signs and symptoms of infection

## 2014-10-31 NOTE — Care Management (Signed)
Patient setup for outpatient dialylis at [facility] now for 1200 chair M W F. CSW has spoken to [facility] team who agrees to transport to and from appointments. Contacted Baxter Hire at [facility] to inform of the change who agrees to fax records to [address].

## 2014-10-31 NOTE — Discharge Instructions (Signed)
°  DIET:  Renal diet  DISCHARGE CONDITION:  Stable  ACTIVITY:  Activity as tolerated  OXYGEN:  Home Oxygen: No.   Oxygen Delivery: room air  DISCHARGE LOCATION:  home   If you experience worsening of your admission symptoms, develop shortness of breath, life threatening emergency, suicidal or homicidal thoughts you must seek medical attention immediately by calling 911 or calling your MD immediately  if symptoms less severe.  You Must read complete instructions/literature along with all the possible adverse reactions/side effects for all the Medicines you take and that have been prescribed to you. Take any new Medicines after you have completely understood and accpet all the possible adverse reactions/side effects.   Please note  You were cared for by a hospitalist during your hospital stay. If you have any questions about your discharge medications or the care you received while you were in the hospital after you are discharged, you can call the unit and asked to speak with the hospitalist on call if the hospitalist that took care of you is not available. Once you are discharged, your primary care physician will handle any further medical issues. Please note that NO REFILLS for any discharge medications will be authorized once you are discharged, as it is imperative that you return to your primary care physician (or establish a relationship with a primary care physician if you do not have one) for your aftercare needs so that they can reassess your need for medications and monitor your lab values.

## 2014-10-31 NOTE — Care Management (Signed)
Patient not accepted for group home per CSW.   Transportation will need to be setup to allow patient to make dialysis appointments. Contacted Baxter Hire at [facility] to see if chair time could be earlier than scheduled now. Plan is for earlier chair time to accommodate transportation form patient residence.

## 2014-10-31 NOTE — Care Management (Signed)
Received e-mail from Baxter H[facility] at [facility].  Patient has chair time for Friday at [place].

## 2014-12-06 ENCOUNTER — Encounter
Admission: RE | Admit: 2014-12-06 | Discharge: 2014-12-06 | Disposition: A | Discharge status: Home / Self Care | Payer: Medicaid Other | Source: Ambulatory Visit | Attending: Vascular Surgery | Admitting: Vascular Surgery

## 2014-12-06 DIAGNOSIS — Z01810 Encounter for preprocedural cardiovascular examination: Secondary | ICD-10-CM | POA: Insufficient documentation

## 2014-12-06 DIAGNOSIS — Z01812 Encounter for preprocedural laboratory examination: Secondary | ICD-10-CM | POA: Diagnosis not present

## 2014-12-06 HISTORY — DX: Dependence on renal dialysis: Z99.2

## 2014-12-06 HISTORY — DX: Hyperlipidemia, unspecified: E78.5

## 2014-12-06 HISTORY — DX: Type 2 diabetes mellitus without complications: E11.9

## 2014-12-06 LAB — CBC
HCT: 33.9 % — ABNORMAL LOW (ref 40.0–52.0)
HEMOGLOBIN: 10.8 g/dL — AB (ref 13.0–18.0)
MCH: 33.3 pg (ref 26.0–34.0)
MCHC: 32 g/dL (ref 32.0–36.0)
MCV: 104.3 fL — AB (ref 80.0–100.0)
Platelets: 193 10*3/uL (ref 150–440)
RBC: 3.25 MIL/uL — ABNORMAL LOW (ref 4.40–5.90)
RDW: 17.9 % — ABNORMAL HIGH (ref 11.5–14.5)
WBC: 6.1 10*3/uL (ref 3.8–10.6)

## 2014-12-06 LAB — BASIC METABOLIC PANEL
Anion gap: 10 (ref 5–15)
BUN: 23 mg/dL — ABNORMAL HIGH (ref 6–20)
CHLORIDE: 97 mmol/L — AB (ref 101–111)
CO2: 28 mmol/L (ref 22–32)
Calcium: 8.5 mg/dL — ABNORMAL LOW (ref 8.9–10.3)
Creatinine, Ser: 6.14 mg/dL — ABNORMAL HIGH (ref 0.61–1.24)
GFR calc non Af Amer: 9 mL/min — ABNORMAL LOW (ref >60)
GFR, EST AFRICAN AMERICAN: 11 mL/min — AB (ref >60)
Glucose, Bld: 87 mg/dL (ref 65–99)
POTASSIUM: 3.9 mmol/L (ref 3.5–5.1)
SODIUM: 135 mmol/L (ref 135–145)

## 2014-12-06 LAB — TYPE AND SCREEN
ABO/RH(D): O POS
Antibody Screen: NEGATIVE

## 2014-12-06 LAB — PROTIME-INR
INR: 0.95
Prothrombin Time: 12.9 seconds (ref 11.4–15.0)

## 2014-12-06 LAB — APTT: aPTT: 32 seconds (ref 24–36)

## 2014-12-06 LAB — ABO/RH: ABO/RH(D): O POS

## 2014-12-06 NOTE — Patient Instructions (Signed)
  Your procedure is scheduled on: December 13, 2014 (Thursday) Report to Day Surgery. To find out your arrival time please call [phone] between 1PM - 3PM on July 13,2016 (Wednesday).  Remember: Instructions that are not followed completely may result in serious medical risk, up to and including death, or upon the discretion of your surgeon and anesthesiologist your surgery may need to be rescheduled.    __x__ 1. Do not eat food or drink liquids after midnight. No gum chewing or hard candies.     ____ 2. No Alcohol for 24 hours before or after surgery.   ____ 3. Bring all medications with you on the day of surgery if instructed.    __x__ 4. Notify your doctor if there is any change in your medical condition     (cold, fever, infections).     Do not wear jewelry, make-up, hairpins, clips or nail polish.  Do not wear lotions, powders, or perfumes. You may wear deodorant.  Do not shave 48 hours prior to surgery. Men may shave face and neck.  Do not bring valuables to the hospital.    [facility] is not responsible for any belongings or valuables.               Contacts, dentures or bridgework may not be worn into surgery.  Leave your suitcase in the car. After surgery it may be brought to your room.  For patients admitted to the hospital, discharge time is determined by your                treatment team.   Patients discharged the day of surgery will not be allowed to drive home.   Please read over the following fact sheets that you were given:      __x__ Take these medicines the morning of surgery with A SIP OF WATER:    1. Atenolol  2. Clonidine   3.Minoxidil   4.Fluphenazine              5.Trihexyphenidyl              6.Omeprazole (Omeprazole at bedtime on July 13)  ____ Fleet Enema (as directed)   ____ Use CHG Soap as directed  ____ Use inhalers on the day of surgery  ____ Stop metformin 2 days prior to surgery    ____ Take 1/2 of usual insulin dose the night before  surgery and none on the morning of surgery.   ____ Stop Coumadin/Plavix/aspirin on   ____ Stop Anti-inflammatories on    ____ Stop supplements until after surgery.    ____ Bring C-Pap to the hospital.

## 2014-12-13 ENCOUNTER — Encounter: Admission: RE | Payer: Self-pay | Source: Ambulatory Visit

## 2014-12-13 ENCOUNTER — Ambulatory Visit: Admission: RE | Admit: 2014-12-13 | Payer: Medicaid Other | Source: Ambulatory Visit | Admitting: Vascular Surgery

## 2014-12-13 SURGERY — ARTERIOVENOUS (AV) FISTULA CREATION
Anesthesia: General | Laterality: Left

## 2014-12-13 MED ORDER — HEPARIN SODIUM (PORCINE) 5000 UNIT/ML IJ SOLN
INTRAMUSCULAR | Status: AC
  Filled 2014-12-13: qty 1

## 2014-12-13 MED ORDER — BUPIVACAINE-EPINEPHRINE (PF) 0.5% -1:200000 IJ SOLN
INTRAMUSCULAR | Status: AC
  Filled 2014-12-13: qty 30

## 2014-12-13 MED ORDER — PAPAVERINE HCL 30 MG/ML IJ SOLN
INTRAMUSCULAR | Status: AC
  Filled 2014-12-13: qty 2

## 2014-12-26 ENCOUNTER — Encounter: Payer: Self-pay | Admitting: *Deleted

## 2014-12-26 ENCOUNTER — Ambulatory Visit: Payer: Medicaid Other | Admitting: Anesthesiology

## 2014-12-26 ENCOUNTER — Ambulatory Visit
Admission: RE | Admit: 2014-12-26 | Discharge: 2014-12-26 | Disposition: A | Discharge status: Home / Self Care | Payer: Medicaid Other | Source: Ambulatory Visit | Attending: Vascular Surgery | Admitting: Vascular Surgery

## 2014-12-26 ENCOUNTER — Encounter
Admission: RE | Disposition: A | Discharge status: Home / Self Care | Payer: Self-pay | Source: Ambulatory Visit | Attending: Vascular Surgery

## 2014-12-26 DIAGNOSIS — Z8249 Family history of ischemic heart disease and other diseases of the circulatory system: Secondary | ICD-10-CM | POA: Insufficient documentation

## 2014-12-26 DIAGNOSIS — I8390 Asymptomatic varicose veins of unspecified lower extremity: Secondary | ICD-10-CM | POA: Insufficient documentation

## 2014-12-26 DIAGNOSIS — Z833 Family history of diabetes mellitus: Secondary | ICD-10-CM | POA: Diagnosis not present

## 2014-12-26 DIAGNOSIS — Z992 Dependence on renal dialysis: Secondary | ICD-10-CM | POA: Diagnosis not present

## 2014-12-26 DIAGNOSIS — I120 Hypertensive chronic kidney disease with stage 5 chronic kidney disease or end stage renal disease: Secondary | ICD-10-CM | POA: Insufficient documentation

## 2014-12-26 DIAGNOSIS — Z79899 Other long term (current) drug therapy: Secondary | ICD-10-CM | POA: Diagnosis not present

## 2014-12-26 DIAGNOSIS — F209 Schizophrenia, unspecified: Secondary | ICD-10-CM | POA: Diagnosis not present

## 2014-12-26 DIAGNOSIS — E785 Hyperlipidemia, unspecified: Secondary | ICD-10-CM | POA: Insufficient documentation

## 2014-12-26 DIAGNOSIS — K759 Inflammatory liver disease, unspecified: Secondary | ICD-10-CM | POA: Insufficient documentation

## 2014-12-26 DIAGNOSIS — F17210 Nicotine dependence, cigarettes, uncomplicated: Secondary | ICD-10-CM | POA: Diagnosis not present

## 2014-12-26 DIAGNOSIS — D649 Anemia, unspecified: Secondary | ICD-10-CM | POA: Diagnosis not present

## 2014-12-26 DIAGNOSIS — N186 End stage renal disease: Secondary | ICD-10-CM | POA: Insufficient documentation

## 2014-12-26 DIAGNOSIS — E1122 Type 2 diabetes mellitus with diabetic chronic kidney disease: Secondary | ICD-10-CM | POA: Insufficient documentation

## 2014-12-26 DIAGNOSIS — I739 Peripheral vascular disease, unspecified: Secondary | ICD-10-CM | POA: Diagnosis not present

## 2014-12-26 DIAGNOSIS — Z888 Allergy status to other drugs, medicaments and biological substances status: Secondary | ICD-10-CM | POA: Insufficient documentation

## 2014-12-26 DIAGNOSIS — R569 Unspecified convulsions: Secondary | ICD-10-CM | POA: Diagnosis not present

## 2014-12-26 HISTORY — PX: AV FISTULA PLACEMENT: SHX1204

## 2014-12-26 LAB — TYPE AND SCREEN
ABO/RH(D): O POS
ANTIBODY SCREEN: NEGATIVE

## 2014-12-26 LAB — POTASSIUM: Potassium: 5.3 mmol/L — ABNORMAL HIGH (ref 3.5–5.1)

## 2014-12-26 LAB — GLUCOSE, CAPILLARY
GLUCOSE-CAPILLARY: 86 mg/dL (ref 65–99)
Glucose-Capillary: 91 mg/dL (ref 65–99)

## 2014-12-26 SURGERY — ARTERIOVENOUS (AV) FISTULA CREATION
Anesthesia: General | Laterality: Left | Wound class: Clean

## 2014-12-26 MED ORDER — SUCCINYLCHOLINE CHLORIDE 20 MG/ML IJ SOLN
INTRAMUSCULAR | Status: DC | PRN
  Administered 2014-12-26: 60 mg via INTRAVENOUS

## 2014-12-26 MED ORDER — SODIUM CHLORIDE 0.9 % IV SOLN
INTRAVENOUS | Status: DC
  Administered 2014-12-26: 10:00:00 via INTRAVENOUS

## 2014-12-26 MED ORDER — HYDROCODONE-ACETAMINOPHEN 5-325 MG PO TABS
1.0000 | ORAL_TABLET | ORAL | Status: DC | PRN
Start: 2014-12-26 — End: 2014-12-26
  Administered 2014-12-26: 1 via ORAL

## 2014-12-26 MED ORDER — MIDAZOLAM HCL 2 MG/2ML IJ SOLN
INTRAMUSCULAR | Status: DC | PRN
  Administered 2014-12-26: 2 mg via INTRAVENOUS

## 2014-12-26 MED ORDER — PROPOFOL 10 MG/ML IV BOLUS
INTRAVENOUS | Status: DC | PRN
  Administered 2014-12-26: 150 mg via INTRAVENOUS

## 2014-12-26 MED ORDER — ONDANSETRON HCL 4 MG/2ML IJ SOLN: 4.0000 mg | Freq: Once | INTRAMUSCULAR | Status: DC | PRN

## 2014-12-26 MED ORDER — GLYCOPYRROLATE 0.2 MG/ML IJ SOLN
INTRAMUSCULAR | Status: DC | PRN
  Administered 2014-12-26: 0.2 mg via INTRAVENOUS

## 2014-12-26 MED ORDER — PAPAVERINE HCL 30 MG/ML IJ SOLN
INTRAMUSCULAR | Status: AC
  Filled 2014-12-26: qty 2

## 2014-12-26 MED ORDER — ONDANSETRON HCL 4 MG/2ML IJ SOLN
INTRAMUSCULAR | Status: DC | PRN
  Administered 2014-12-26: 4 mg via INTRAVENOUS

## 2014-12-26 MED ORDER — HYDROCODONE-ACETAMINOPHEN 5-325 MG PO TABS: 1.0000 | ORAL_TABLET | Freq: Four times a day (QID) | ORAL | Status: DC | PRN

## 2014-12-26 MED ORDER — LIDOCAINE HCL (CARDIAC) 20 MG/ML IV SOLN
INTRAVENOUS | Status: DC | PRN
  Administered 2014-12-26: 80 mg via INTRAVENOUS

## 2014-12-26 MED ORDER — FENTANYL CITRATE (PF) 100 MCG/2ML IJ SOLN: 25.0000 ug | INTRAMUSCULAR | Status: DC | PRN

## 2014-12-26 MED ORDER — PHENYLEPHRINE HCL 10 MG/ML IJ SOLN
INTRAMUSCULAR | Status: DC | PRN
  Administered 2014-12-26: 200 ug via INTRAVENOUS
  Administered 2014-12-26 (×2): 100 ug via INTRAVENOUS

## 2014-12-26 MED ORDER — FENTANYL CITRATE (PF) 100 MCG/2ML IJ SOLN
INTRAMUSCULAR | Status: DC | PRN
  Administered 2014-12-26: 50 ug via INTRAVENOUS

## 2014-12-26 MED ORDER — HEPARIN SODIUM (PORCINE) 1000 UNIT/ML IJ SOLN
INTRAMUSCULAR | Status: DC | PRN
  Administered 2014-12-26: 3000 [IU] via INTRAVENOUS

## 2014-12-26 MED ORDER — ATENOLOL 50 MG PO TABS
ORAL_TABLET | ORAL | Status: AC
  Filled 2014-12-26: qty 1

## 2014-12-26 MED ORDER — CEFAZOLIN SODIUM 1-5 GM-% IV SOLN
1.0000 g | Freq: Once | INTRAVENOUS | Status: AC
  Administered 2014-12-26: 1 g via INTRAVENOUS

## 2014-12-26 MED ORDER — HYDROCODONE-ACETAMINOPHEN 5-325 MG PO TABS
ORAL_TABLET | ORAL | Status: AC
  Filled 2014-12-26: qty 1

## 2014-12-26 MED ORDER — HEPARIN SODIUM (PORCINE) 5000 UNIT/ML IJ SOLN
INTRAMUSCULAR | Status: AC
  Filled 2014-12-26: qty 1

## 2014-12-26 MED ORDER — BUPIVACAINE-EPINEPHRINE (PF) 0.5% -1:200000 IJ SOLN
INTRAMUSCULAR | Status: AC
  Filled 2014-12-26: qty 30

## 2014-12-26 MED ORDER — ATENOLOL 50 MG PO TABS
25.0000 mg | ORAL_TABLET | Freq: Once | ORAL | Status: AC
  Administered 2014-12-26: 25 mg via ORAL
  Filled 2014-12-26: qty 0.5

## 2014-12-26 SURGICAL SUPPLY — 47 items
BAG DECANTER STRL (MISCELLANEOUS) ×3 IMPLANT
BLADE SURG SZ11 CARB STEEL (BLADE) ×3 IMPLANT
BOOT SUTURE AID YELLOW STND (SUTURE) ×3 IMPLANT
BRUSH SCRUB 4% CHG (MISCELLANEOUS) ×3 IMPLANT
CANISTER SUCT 1200ML W/VALVE (MISCELLANEOUS) ×3 IMPLANT
CHLORAPREP W/TINT 26ML (MISCELLANEOUS) ×3 IMPLANT
CLIP SPRNG 6MM S-JAW DBL (CLIP) ×3
ELECT CAUTERY BLADE 6.4 (BLADE) ×3 IMPLANT
GEL ULTRASOUND 20GR AQUASONIC (MISCELLANEOUS) IMPLANT
GLOVE BIO SURGEON STRL SZ7 (GLOVE) ×12 IMPLANT
GOWN STRL REUS W/ TWL LRG LVL3 (GOWN DISPOSABLE) ×1 IMPLANT
GOWN STRL REUS W/ TWL XL LVL3 (GOWN DISPOSABLE) ×1 IMPLANT
GOWN STRL REUS W/TWL LRG LVL3 (GOWN DISPOSABLE) ×2
GOWN STRL REUS W/TWL XL LVL3 (GOWN DISPOSABLE) ×2
HEMOSTAT SURGICEL 2X3 (HEMOSTASIS) ×3 IMPLANT
IV NS 500ML (IV SOLUTION) ×2
IV NS 500ML BAXH (IV SOLUTION) ×1 IMPLANT
KIT RM TURNOVER STRD PROC AR (KITS) ×3 IMPLANT
LABEL OR SOLS (LABEL) ×3 IMPLANT
LIQUID BAND (GAUZE/BANDAGES/DRESSINGS) ×3 IMPLANT
LOOP RED MAXI  1X406MM (MISCELLANEOUS) ×2
LOOP VESSEL MAXI 1X406 RED (MISCELLANEOUS) ×1 IMPLANT
LOOP VESSEL MINI 0.8X406 BLUE (MISCELLANEOUS) ×1 IMPLANT
LOOPS BLUE MINI 0.8X406MM (MISCELLANEOUS) ×2
NEEDLE FILTER BLUNT 18X 1/2SAF (NEEDLE) ×2
NEEDLE FILTER BLUNT 18X1 1/2 (NEEDLE) ×1 IMPLANT
NEEDLE HYPO 30X.5 LL (NEEDLE) IMPLANT
NS IRRIG 500ML POUR BTL (IV SOLUTION) IMPLANT
PACK EXTREMITY ARMC (MISCELLANEOUS) ×3 IMPLANT
PAD GROUND ADULT SPLIT (MISCELLANEOUS) ×3 IMPLANT
PAD PREP 24X41 OB/GYN DISP (PERSONAL CARE ITEMS) ×3 IMPLANT
SOLUTION CELL SAVER (CLIP) ×1 IMPLANT
STOCKINETTE STRL 4IN 9604848 (GAUZE/BANDAGES/DRESSINGS) ×3 IMPLANT
SUT MNCRL AB 4-0 PS2 18 (SUTURE) ×3 IMPLANT
SUT PROLENE 6 0 BV (SUTURE) ×9 IMPLANT
SUT SILK 2 0 (SUTURE) ×2
SUT SILK 2-0 18XBRD TIE 12 (SUTURE) ×1 IMPLANT
SUT SILK 3 0 (SUTURE) ×2
SUT SILK 3-0 18XBRD TIE 12 (SUTURE) ×1 IMPLANT
SUT SILK 4 0 (SUTURE) ×2
SUT SILK 4-0 18XBRD TIE 12 (SUTURE) ×1 IMPLANT
SUT VIC AB 3-0 SH 27 (SUTURE) ×2
SUT VIC AB 3-0 SH 27X BRD (SUTURE) ×1 IMPLANT
SYR 20CC LL (SYRINGE) ×3 IMPLANT
SYR 3ML LL SCALE MARK (SYRINGE) ×3 IMPLANT
SYR TB 1ML 27GX1/2 LL (SYRINGE) IMPLANT
TOWEL OR 17X26 4PK STRL BLUE (TOWEL DISPOSABLE) IMPLANT

## 2014-12-26 NOTE — Transfer of Care (Signed)
Immediate Anesthesia Transfer of Care Note  Patient: Todd Brown  Procedure(s) Performed: Procedure(s): ARTERIOVENOUS (AV) FISTULA CREATION (Left)  Patient Location: PACU  Anesthesia Type:General  Level of Consciousness: awake, alert , oriented and patient cooperative  Airway & Oxygen Therapy: Patient Spontanous Breathing and Patient connected to face mask oxygen  Post-op Assessment: Report given to RN, Post -op Vital signs reviewed and stable and Patient moving all extremities X 4  Post vital signs: Reviewed and stable  Last Vitals:  Filed Vitals:   12/26/14 1224  BP: 95/67  Pulse: 79  Temp: 36.2 C  Resp: 13    Complications: No apparent anesthesia complications

## 2014-12-26 NOTE — Progress Notes (Signed)
Thrill positive to left inner arm

## 2014-12-26 NOTE — Anesthesia Preprocedure Evaluation (Addendum)
Anesthesia Evaluation  Patient identified by MRN, date of birth, ID band Patient awake    Reviewed: Allergy & Precautions, NPO status , Patient's Chart, lab work & pertinent test results  Airway Mallampati: II       Dental  (+) Edentulous Upper, Edentulous Lower   Pulmonary COPDCurrent Smoker,    + decreased breath sounds      Cardiovascular hypertension, Pt. on home beta blockers + Peripheral Vascular Disease Normal cardiovascular exam    Neuro/Psych Seizures -,  Schizophrenia    GI/Hepatic negative GI ROS, (+) Hepatitis -, C  Endo/Other  diabetes, Type 2  Renal/GU CRFRenal disease     Musculoskeletal negative musculoskeletal ROS (+)   Abdominal   Peds  Hematology  (+) anemia ,   Anesthesia Other Findings   Reproductive/Obstetrics negative OB ROS                            Anesthesia Physical Anesthesia Plan  ASA: III  Anesthesia Plan: General   Post-op Pain Management:    Induction: Intravenous  Airway Management Planned: LMA  Additional Equipment:   Intra-op Plan:   Post-operative Plan:   Informed Consent: I have reviewed the patients History and Physical, chart, labs and discussed the procedure including the risks, benefits and alternatives for the proposed anesthesia with the patient or authorized representative who has indicated his/her understanding and acceptance.     Plan Discussed with: CRNA  Anesthesia Plan Comments:         Anesthesia Quick Evaluation

## 2014-12-26 NOTE — OR Nursing (Signed)
Unable to verify medications pharmacies on record contact but no longer using those pharmacies. Pt unable to contribute any information to this situation. Dr Lucky Cowboy and Dr Boston Service Notified.

## 2014-12-26 NOTE — Progress Notes (Signed)
Pt awakened, oral airway removed intact 

## 2014-12-26 NOTE — Anesthesia Procedure Notes (Addendum)
Procedure Name: LMA Insertion Date/Time: 12/26/2014 11:08 AM Performed by: Silvana Newness Pre-anesthesia Checklist: Patient identified, Emergency Drugs available, Suction available, Patient being monitored and Timeout performed Patient Re-evaluated:Patient Re-evaluated prior to inductionOxygen Delivery Method: Circle system utilized Preoxygenation: Pre-oxygenation with 100% oxygen Intubation Type: IV induction Ventilation: Mask ventilation without difficulty LMA: LMA inserted LMA Size: 4.5 Number of attempts: 1 Placement Confirmation: positive ETCO2 and breath sounds checked- equal and bilateral Tube secured with: Tape Dental Injury: Teeth and Oropharynx as per pre-operative assessment    Procedure Name: Intubation Date/Time: 12/26/2014 11:12 AM Performed by: Silvana Newness Pre-anesthesia Checklist: Patient identified, Emergency Drugs available, Suction available, Patient being monitored and Timeout performed Patient Re-evaluated:Patient Re-evaluated prior to inductionOxygen Delivery Method: Circle system utilized Preoxygenation: Pre-oxygenation with 100% oxygen Intubation Type: IV induction Ventilation: Mask ventilation without difficulty Laryngoscope Size: Mac and 4 Grade View: Grade I Tube type: Oral Tube size: 7.5 mm Number of attempts: 1 (placed by dr. Boston Service) Airway Equipment and Method: Rigid stylet Placement Confirmation: ETT inserted through vocal cords under direct vision,  positive ETCO2 and breath sounds checked- equal and bilateral Secured at: 23 cm Tube secured with: Tape Dental Injury: Teeth and Oropharynx as per pre-operative assessment

## 2014-12-26 NOTE — H&P (Signed)
[facility] History & Physical Update  The patient was interviewed and re-examined.  The patient's previous History and Physical has been reviewed and is unchanged.  There is no change in the plan of care. We plan to proceed with the scheduled procedure.  Dyamon Sosinski, MD  12/26/2014, 10:50 AM

## 2014-12-26 NOTE — Op Note (Signed)
[facility]   OPERATIVE NOTE   PROCEDURE: left brachiocephalic arteriovenous fistula placement  PRE-OPERATIVE DIAGNOSIS: 1.  ESRD        POST-OPERATIVE DIAGNOSIS: 1. ESRD       SURGEON: Leotis Pain, MD  ASSISTANT(S): none  ANESTHESIA: general  ESTIMATED BLOOD LOSS: minimal  FINDING(S): Adequate cephalic vein for fistula creation  SPECIMEN(S):  none  INDICATIONS:   DEAGAN CHICAS is a 54 y.o. male who presents with renal failure in need of pemanent dialysis acces.  The patient is scheduled for left arm AVF access procedure and evaluation of the cephalic vein at the time of surgery would determine fistula versus graft placement.  The patient is aware the risks include but are not limited to: bleeding, infection, steal syndrome, nerve damage, ischemic monomelic neuropathy, failure to mature, and need for additional procedures.  The patient is aware of the risks of the procedure and elects to proceed forward.  DESCRIPTION: After full informed written consent was obtained from the patient, the patient was brought back to the operating room and placed supine upon the operating table.  Prior to induction, the patient received IV antibiotics.   After obtaining adequate anesthesia, the patient was then prepped and draped in the standard fashion for a left arm access procedure.  I made a curvilinear incision at the level of the antecubital fossa and dissected through the subcutaneous tissue and fascia to gain exposure of the brachial artery.  This was noted to be patent and adequate in size for fistula creation.  This was dissected out proximally and distally and prepared for control with vessel loops .  I then dissected out the cephalic vein.  This was noted to be patent and adequate in size for fistula creation.  I then gave the patient 3000 units of intravenous heparin.  The vein was marked for orientation and the distal segment of the vein was ligated with a  2-0  silk, and the vein was transected.  I then instilled the heparinized saline into the vein and clamped it.  At this point, I reset my exposure of the brachial artery and pulled up control on the vessel loops.  I made an arteriotomy with a #11 blade, and then I extended the arteriotomy with a Potts scissor.  I injected heparinized saline proximal and distal to this arteriotomy.  The vein was then sewn to the artery in an end-to-side configuration with a running stitch of 6-0 Prolene.  Prior to completing this anastomosis, I allowed the vein and artery to backbleed.  There was no evidence of clot from any vessels.  I completed the anastomosis in the usual fashion and then released all vessel loops and clamps. Two 6-0 Prolene patch sutures were used for hemostasis. There was a palpable  thrill in the venous outflow, and there was a palpable pulse in the artery distal to the anastomosis.  At this point, I irrigated out the surgical wound.  Surgicel was placed. There was no further active bleeding.  The subcutaneous tissue was reapproximated with a running stitch of 3-0 Vicryl.  The skin was then closed with a 4-0 Monocryl suture.  The skin was then cleaned, dried, and reinforced with Dermabond.  The patient tolerated this procedure well and was taken to the recovery room in stable condition  COMPLICATIONS: None  CONDITION: Stable   Aysel Gilchrest    12/26/2014, 12:14 PM

## 2014-12-26 NOTE — Discharge Instructions (Signed)
No heavy lifting 5 days No driving one week Call or contact our office with fever >101, wound redness or drainage, severe pain, or other issuesAMBULATORY SURGERY  DISCHARGE INSTRUCTIONS   1) The drugs that you were given will stay in your system until tomorrow so for the next 24 hours you should not:  A) Drive an automobile B) Make any legal decisions C) Drink any alcoholic beverage   2) You may resume regular meals tomorrow.  Today it is better to start with liquids and gradually work up to solid foods.  You may eat anything you prefer, but it is better to start with liquids, then soup and crackers, and gradually work up to solid foods.   3) Please notify your doctor immediately if you have any unusual bleeding, trouble breathing, redness and pain at the surgery site, drainage, fever, or pain not relieved by medication.    4) Additional Instructions:        Please contact your physician with any problems or Same Day Surgery at [phone], Monday through Friday 6 am to 4 pm, or [place] at [facility] number at [phone].AMBULATORY SURGERY  DISCHARGE INSTRUCTIONS   5) The drugs that you were given will stay in your system until tomorrow so for the next 24 hours you should not:  D) Drive an automobile E) Make any legal decisions F) Drink any alcoholic beverage   6) You may resume regular meals tomorrow.  Today it is better to start with liquids and gradually work up to solid foods.  You may eat anything you prefer, but it is better to start with liquids, then soup and crackers, and gradually work up to solid foods.   7) Please notify your doctor immediately if you have any unusual bleeding, trouble breathing, redness and pain at the surgery site, drainage, fever, or pain not relieved by medication.    8) Additional Instructions:        Please contact your physician with any problems or Same Day Surgery at [phone], Monday through Friday 6 am to 4  pm, or  at [facility] number at [phone].

## 2014-12-28 NOTE — Anesthesia Postprocedure Evaluation (Signed)
  Anesthesia Post-op Note  Patient: Todd Brown  Procedure(s) Performed: Procedure(s): ARTERIOVENOUS (AV) FISTULA CREATION (Left)  Anesthesia type:General  Patient location: PACU  Post pain: Pain level controlled  Post assessment: Post-op Vital signs reviewed, Patient's Cardiovascular Status Stable, Respiratory Function Stable, Patent Airway and No signs of Nausea or vomiting  Post vital signs: Reviewed and stable  Last Vitals:  Filed Vitals:   12/26/14 1353  BP: 176/87  Pulse: 55  Temp: 36 C  Resp: 14    Level of consciousness: awake, alert  and patient cooperative  Complications: No apparent anesthesia complications

## 2015-01-21 ENCOUNTER — Encounter: Payer: Self-pay | Admitting: Emergency Medicine

## 2015-01-21 ENCOUNTER — Emergency Department: Payer: Medicaid Other

## 2015-01-21 ENCOUNTER — Emergency Department
Admission: EM | Admit: 2015-01-21 | Discharge: 2015-01-21 | Disposition: A | Discharge status: Home / Self Care | Payer: Medicaid Other | Attending: Emergency Medicine | Admitting: Emergency Medicine

## 2015-01-21 DIAGNOSIS — I120 Hypertensive chronic kidney disease with stage 5 chronic kidney disease or end stage renal disease: Secondary | ICD-10-CM | POA: Insufficient documentation

## 2015-01-21 DIAGNOSIS — Z79899 Other long term (current) drug therapy: Secondary | ICD-10-CM | POA: Insufficient documentation

## 2015-01-21 DIAGNOSIS — N186 End stage renal disease: Secondary | ICD-10-CM | POA: Diagnosis not present

## 2015-01-21 DIAGNOSIS — Z720 Tobacco use: Secondary | ICD-10-CM | POA: Diagnosis not present

## 2015-01-21 DIAGNOSIS — N5089 Other specified disorders of the male genital organs: Secondary | ICD-10-CM

## 2015-01-21 DIAGNOSIS — Z992 Dependence on renal dialysis: Secondary | ICD-10-CM | POA: Insufficient documentation

## 2015-01-21 DIAGNOSIS — N50819 Testicular pain, unspecified: Secondary | ICD-10-CM

## 2015-01-21 DIAGNOSIS — N453 Epididymo-orchitis: Secondary | ICD-10-CM | POA: Insufficient documentation

## 2015-01-21 DIAGNOSIS — E119 Type 2 diabetes mellitus without complications: Secondary | ICD-10-CM | POA: Insufficient documentation

## 2015-01-21 DIAGNOSIS — N508 Other specified disorders of male genital organs: Secondary | ICD-10-CM | POA: Diagnosis present

## 2015-01-21 LAB — CBC
HCT: 42 % (ref 40.0–52.0)
Hemoglobin: 14 g/dL (ref 13.0–18.0)
MCH: 33.9 pg (ref 26.0–34.0)
MCHC: 33.4 g/dL (ref 32.0–36.0)
MCV: 101.6 fL — AB (ref 80.0–100.0)
Platelets: 196 10*3/uL (ref 150–440)
RBC: 4.13 MIL/uL — ABNORMAL LOW (ref 4.40–5.90)
RDW: 14.1 % (ref 11.5–14.5)
WBC: 10.9 10*3/uL — ABNORMAL HIGH (ref 3.8–10.6)

## 2015-01-21 LAB — BASIC METABOLIC PANEL
Anion gap: 11 (ref 5–15)
BUN: 22 mg/dL — ABNORMAL HIGH (ref 6–20)
CO2: 27 mmol/L (ref 22–32)
Calcium: 8.4 mg/dL — ABNORMAL LOW (ref 8.9–10.3)
Chloride: 93 mmol/L — ABNORMAL LOW (ref 101–111)
Creatinine, Ser: 4.7 mg/dL — ABNORMAL HIGH (ref 0.61–1.24)
GFR calc Af Amer: 15 mL/min — ABNORMAL LOW (ref >60)
GFR, EST NON AFRICAN AMERICAN: 13 mL/min — AB (ref >60)
GLUCOSE: 109 mg/dL — AB (ref 65–99)
POTASSIUM: 3.7 mmol/L (ref 3.5–5.1)
SODIUM: 131 mmol/L — AB (ref 135–145)

## 2015-01-21 MED ORDER — LEVOFLOXACIN 500 MG PO TABS
500.0000 mg | ORAL_TABLET | Freq: Once | ORAL | Status: AC
  Administered 2015-01-21: 500 mg via ORAL
  Filled 2015-01-21: qty 1

## 2015-01-21 MED ORDER — HYDROMORPHONE HCL 1 MG/ML IJ SOLN: 1.0000 mg | Freq: Once | INTRAMUSCULAR | Status: DC

## 2015-01-21 MED ORDER — LEVOFLOXACIN 250 MG PO TABS: 250.0000 mg | ORAL_TABLET | ORAL | Status: AC

## 2015-01-21 MED ORDER — HYDROMORPHONE HCL 1 MG/ML IJ SOLN
1.0000 mg | Freq: Once | INTRAMUSCULAR | Status: AC
  Administered 2015-01-21: 1 mg via INTRAVENOUS
  Filled 2015-01-21: qty 1

## 2015-01-21 MED ORDER — MORPHINE SULFATE (PF) 4 MG/ML IV SOLN
4.0000 mg | Freq: Once | INTRAVENOUS | Status: AC
  Administered 2015-01-21: 4 mg via INTRAVENOUS
  Filled 2015-01-21: qty 1

## 2015-01-21 MED ORDER — DEXTROSE 5 % IV SOLN
1.0000 g | Freq: Once | INTRAVENOUS | Status: AC
  Administered 2015-01-21: 1 g via INTRAVENOUS
  Filled 2015-01-21 (×2): qty 10

## 2015-01-21 MED ORDER — OXYCODONE-ACETAMINOPHEN 5-325 MG PO TABS: 1.0000 | ORAL_TABLET | Freq: Four times a day (QID) | ORAL | Status: DC | PRN

## 2015-01-21 NOTE — ED Provider Notes (Signed)
[facility] Emergency Department Provider Note  Time seen: 6:43 PM  I have reviewed the triage vital signs and the nursing notes.   HISTORY  Chief Complaint Testicle Pain    HPI Todd Brown is a 54 y.o. male with a past medical history of end-stage renal disease on hemodialysis, hyperlipidemia, anemia, schizophrenia, diabetes who presents the emergency department with scrotal pain and swelling for the past 3 days. According to the patient he began with scrotal pain on Saturday while sitting on a bench. He has noticed progressive pain and swelling of his scrotum since. Denies any penile discharge. Denies any recent sexual activity. Denies abdominal pain. Denies nausea or vomiting or fever. Describes his pain as severe located in bilateral testicles/scrotum. Denies any penile pain. Denies any lesions to his groin.     Past Medical History  Diagnosis Date  . ESRD (end stage renal disease)   . Malignant hypertension   . Schizophrenia   . Anemia   . Renal artery stenosis   . Diabetes mellitus without complication   . Hyperlipidemia   . Dialysis patient     Mon. -Wed.- Fri    Patient Active Problem List   Diagnosis Date Noted  . Acute kidney injury 10/20/2014  . Hepatitis C 09/26/2013  . Schizophrenia 03/23/2013  . Essential hypertension, benign 03/23/2013  . Chronic kidney disease 03/23/2013    Past Surgical History  Procedure Laterality Date  . Repair fx left lower leg      yrs ago (age 32)  . Peripheral vascular catheterization N/A 10/22/2014    Procedure: Dialysis/Perma Catheter Insertion;  Surgeon: Algernon Huxley, MD;  Location: [place] [facility];  Service: Cardiovascular;  Laterality: N/A;  . Insertion of dialysis catheter Right   . Av fistula placement Left 12/26/2014    Procedure: ARTERIOVENOUS (AV) FISTULA CREATION;  Surgeon: Algernon Huxley, MD;  Location: [facility];  Service: Vascular;  Laterality: Left;    Current Outpatient Rx  Name   Route  Sig  Dispense  Refill  . amLODipine (NORVASC) 10 MG tablet   Oral   Take 1 tablet (10 mg total) by mouth at bedtime.   30 tablet   1   . atenolol (TENORMIN) 25 MG tablet   Oral   Take 1 tablet (25 mg total) by mouth 2 (two) times daily.   60 tablet   1   . baclofen (LIORESAL) 10 MG tablet   Oral   Take 10 mg by mouth 2 (two) times daily as needed for muscle spasms.         . calcium acetate (PHOSLO) 667 MG capsule   Oral   Take 2 capsules (1,334 mg total) by mouth 3 (three) times daily with meals.   60 capsule   1   . carbamazepine (TEGRETOL) 200 MG tablet   Oral   Take 2.5 tablets (500 mg total) by mouth 2 (two) times daily. Taken 2 1/2 tabs (500mg  ) twice a day   150 tablet   0   . cholecalciferol (VITAMIN D) 1000 UNITS tablet   Oral   Take 1,000 Units by mouth daily.         . citalopram (CELEXA) 20 MG tablet   Oral   Take 1 tablet (20 mg total) by mouth daily.   30 tablet   1   . cloNIDine (CATAPRES) 0.2 MG tablet   Oral   Take 1 tablet (0.2 mg total) by mouth 3 (three) times  daily.   90 tablet   1   . Fe Fum-FA-B Cmp-C-Zn-Mg-Mn-Cu (FERROCITE PLUS PO)   Oral   Take by mouth daily.         . fluPHENAZine (PROLIXIN) 2.5 MG tablet   Oral   Take 2.5 mg by mouth daily.         . fluPHENAZine decanoate (PROLIXIN) 25 MG/ML injection   Intramuscular   Inject 37.5 mg into the muscle every 14 (fourteen) days.         . hydrALAZINE (APRESOLINE) 100 MG tablet   Oral   Take 1 tablet (100 mg total) by mouth every 6 (six) hours.   120 tablet   1   . HYDROcodone-acetaminophen (NORCO) 5-325 MG per tablet   Oral   Take 1 tablet by mouth every 6 (six) hours as needed for moderate pain.   30 tablet   0   . irbesartan (AVAPRO) 300 MG tablet   Oral   Take 1 tablet (300 mg total) by mouth daily.   30 tablet   1   . minoxidil (LONITEN) 2.5 MG tablet   Oral   Take 2 tablets (5 mg total) by mouth daily.   60 tablet   1   . omeprazole  (PRILOSEC) 20 MG capsule   Oral   Take 20 mg by mouth daily.         . traMADol (ULTRAM) 50 MG tablet   Oral   Take 50 mg by mouth every 6 (six) hours as needed for pain.         . trihexyphenidyl (ARTANE) 5 MG tablet   Oral   Take 5 mg by mouth 3 (three) times daily with meals.           Allergies Thorazine  Family History  Problem Relation Age of Onset  . Diabetes Neg Hx     Social History Social History  Substance Use Topics  . Smoking status: Current Every Day Smoker -- 0.50 packs/day    Types: Cigarettes  . Smokeless tobacco: Never Used     Comment: 10 cigars a day  . Alcohol Use: No    Review of Systems Constitutional: Negative for fever Cardiovascular: Negative for chest pain. Respiratory: Negative for shortness of breath. Gastrointestinal: Negative for abdominal pain Genitourinary: Negative for dysuria. Negative for discharge. Positive for scrotal pain and swelling. Musculoskeletal: Negative for back pain. Neurological: Negative for headache 10-point ROS otherwise negative.  ____________________________________________   PHYSICAL EXAM:  VITAL SIGNS: ED Triage Vitals  Enc Vitals Group     BP 01/21/15 1818 130/91 mmHg     Pulse Rate 01/21/15 1818 66     Resp 01/21/15 1818 18     Temp 01/21/15 1818 99.3 F (37.4 C)     Temp Source 01/21/15 1818 Oral     SpO2 01/21/15 1818 97 %     Weight 01/21/15 1818 180 lb (81.647 kg)     Height 01/21/15 1818 6\' 3"  (1.905 m)     Head Cir --      Peak Flow --      Pain Score 01/21/15 1818 10     Pain Loc --      Pain Edu? --      Excl. in [place]? --     Constitutional: Alert.  Well appearing and in no distress. Eyes: Normal exam ENT   Mouth/Throat: Mucous membranes are moist. Cardiovascular: Normal rate, regular rhythm.  Respiratory: Normal respiratory effort without tachypnea nor retractions.  Breath sounds are clear and equal bilaterally. No wheezes/rales/rhonchi. Gastrointestinal: Soft and  nontender. No distention.  Genitourinary: Patient with moderately swollen scrotum. Very tender to palpation, I'm unable to adequately palpate the testicles due to pain. No erythema noted. Patient does have significant right inguinal lymphadenopathy. Musculoskeletal: Nontender with normal range of motion in all extremities.  Neurologic:  Normal speech and language. No gross focal neurologic deficits  Skin:  Skin is warm, dry and intact.  Psychiatric: Calm, cooperative. Patient has slow responses, but states he is "a mental health patient" in that he does not know his entire medical history. Patient does appear to answer questions accurately it just takes a little longer for him to do so. ____________________________________________   RADIOLOGY  Ultrasound consistent with epididimo-orchitis  ____________________________________________   INITIAL IMPRESSION / ASSESSMENT AND PLAN / ED COURSE  Pertinent labs & imaging results that were available during my care of the patient were reviewed by me and considered in my medical decision making (see chart for details).  Patient with scrotal pain and swelling. We will check labs, does pain medication, and check a scrotal ultrasound. Patient agreeable to plan. Ultrasound consistent with epididymoorchitis. I discussed the patient with Dr. Junious Silk the on-call urologist he recommends Rocephin followed by a course of Levaquin. We will dose Levaquin based off of hemodialysis with 250 mg tablet every 66 age. I have sent a message to his nephrologist Dr. Holley Raring regarding this diagnosis, treatment, and follow-up. Patient understands, and agrees. We'll discharge home with pain medications, Levaquin, and follow up with Dr. Holley Raring or his primary care doctor. ____________________________________________   FINAL CLINICAL IMPRESSION(S) / ED DIAGNOSES  Scrotal pain and swelling   Harvest Dark, MD 01/21/15 2136

## 2015-01-21 NOTE — Discharge Instructions (Signed)
As we have discussed you have been diagnosed with epididymo-orchitis, an infection of the right testicle. Please take your pain medication as needed, as prescribed. Please take your antibiotics as written starting this Wednesday 8/24, and continuing every 48 hours for the next 14 days. On days of dialysis please take your antibiotics following dialysis. Please follow-up with your nephrologist Dr. Holley Raring as soon as possible, as well as your primary care doctor. Return to the emergency department for any fever, worsening pain/swelling, vomiting, or any other symptom personally concerning to yourself.   Epididymitis Epididymitis is a swelling (inflammation) of the epididymis. The epididymis is a cord-like structure along the back part of the testicle. Epididymitis is usually, but not always, caused by infection. This is usually a sudden problem beginning with chills, fever and pain behind the scrotum and in the testicle. There may be swelling and redness of the testicle. DIAGNOSIS  Physical examination will reveal a tender, swollen epididymis. Sometimes, cultures are obtained from the urine or from prostate secretions to help find out if there is an infection or if the cause is a different problem. Sometimes, blood work is performed to see if your white blood cell count is elevated and if a germ (bacterial) or viral infection is present. Using this knowledge, an appropriate medicine which kills germs (antibiotic) can be chosen by your caregiver. A viral infection causing epididymitis will most often go away (resolve) without treatment. HOME CARE INSTRUCTIONS   Hot sitz baths for 20 minutes, 4 times per day, may help relieve pain.  Only take over-the-counter or prescription medicines for pain, discomfort or fever as directed by your caregiver.  Take all medicines, including antibiotics, as directed. Take the antibiotics for the full prescribed length of time even if you are feeling better.  It is very  important to keep all follow-up appointments. SEEK IMMEDIATE MEDICAL CARE IF:   You have a fever.  You have pain not relieved with medicines.  You have any worsening of your problems.  Your pain seems to come and go.  You develop pain, redness, and swelling in the scrotum and surrounding areas. MAKE SURE YOU:   Understand these instructions.  Will watch your condition.  Will get help right away if you are not doing well or get worse. Document Released: 05/15/2000 Document Revised: 08/10/2011 Document Reviewed: 04/04/2009 [facility] Patient Information 2015 [place], [place]. This information is not intended to replace advice given to you by your health care provider. Make sure you discuss any questions you have with your health care provider.

## 2015-01-21 NOTE — ED Notes (Signed)
Pt states testicle pain since Saturday, denies any injury states "I was just sitting on the bench and they started hurting"

## 2015-01-21 NOTE — ED Notes (Signed)
Pt states his testicles have been hurting since Sat, states he doesn't know why.

## 2015-02-18 ENCOUNTER — Other Ambulatory Visit: Payer: Self-pay | Admitting: Vascular Surgery

## 2015-02-21 ENCOUNTER — Encounter: Admission: RE | Payer: Self-pay | Source: Ambulatory Visit

## 2015-02-21 SURGERY — DIALYSIS/PERMA CATHETER REMOVAL
Anesthesia: Moderate Sedation

## 2015-02-22 ENCOUNTER — Ambulatory Visit: Admission: RE | Admit: 2015-02-22 | Payer: Medicaid Other | Source: Ambulatory Visit | Admitting: Vascular Surgery

## 2015-02-27 ENCOUNTER — Other Ambulatory Visit: Payer: Self-pay | Admitting: Vascular Surgery

## 2015-02-28 ENCOUNTER — Ambulatory Visit
Admission: RE | Admit: 2015-02-28 | Discharge: 2015-02-28 | Disposition: A | Discharge status: Home / Self Care | Payer: Medicaid Other | Source: Ambulatory Visit | Attending: Vascular Surgery | Admitting: Vascular Surgery

## 2015-02-28 ENCOUNTER — Encounter: Payer: Self-pay | Admitting: *Deleted

## 2015-02-28 ENCOUNTER — Encounter
Admission: RE | Disposition: A | Discharge status: Home / Self Care | Payer: Self-pay | Source: Ambulatory Visit | Attending: Vascular Surgery

## 2015-02-28 DIAGNOSIS — D649 Anemia, unspecified: Secondary | ICD-10-CM | POA: Diagnosis not present

## 2015-02-28 DIAGNOSIS — F17210 Nicotine dependence, cigarettes, uncomplicated: Secondary | ICD-10-CM | POA: Diagnosis not present

## 2015-02-28 DIAGNOSIS — Z992 Dependence on renal dialysis: Secondary | ICD-10-CM | POA: Insufficient documentation

## 2015-02-28 DIAGNOSIS — E785 Hyperlipidemia, unspecified: Secondary | ICD-10-CM | POA: Insufficient documentation

## 2015-02-28 DIAGNOSIS — E119 Type 2 diabetes mellitus without complications: Secondary | ICD-10-CM | POA: Insufficient documentation

## 2015-02-28 DIAGNOSIS — Z4901 Encounter for fitting and adjustment of extracorporeal dialysis catheter: Secondary | ICD-10-CM | POA: Insufficient documentation

## 2015-02-28 DIAGNOSIS — Z79899 Other long term (current) drug therapy: Secondary | ICD-10-CM | POA: Insufficient documentation

## 2015-02-28 DIAGNOSIS — E875 Hyperkalemia: Secondary | ICD-10-CM

## 2015-02-28 DIAGNOSIS — I701 Atherosclerosis of renal artery: Secondary | ICD-10-CM | POA: Diagnosis not present

## 2015-02-28 DIAGNOSIS — N186 End stage renal disease: Secondary | ICD-10-CM | POA: Insufficient documentation

## 2015-02-28 DIAGNOSIS — F209 Schizophrenia, unspecified: Secondary | ICD-10-CM | POA: Diagnosis not present

## 2015-02-28 DIAGNOSIS — I120 Hypertensive chronic kidney disease with stage 5 chronic kidney disease or end stage renal disease: Secondary | ICD-10-CM | POA: Diagnosis not present

## 2015-02-28 HISTORY — PX: PERIPHERAL VASCULAR CATHETERIZATION: SHX172C

## 2015-02-28 SURGERY — DIALYSIS/PERMA CATHETER REMOVAL
Anesthesia: Moderate Sedation

## 2015-02-28 MED ORDER — CHLORHEXIDINE GLUCONATE CLOTH 2 % EX PADS: 6.0000 | MEDICATED_PAD | Freq: Once | CUTANEOUS | Status: DC

## 2015-02-28 MED ORDER — LIDOCAINE-EPINEPHRINE (PF) 1 %-1:200000 IJ SOLN
INTRAMUSCULAR | Status: DC | PRN
Start: 2015-02-28 — End: 2015-02-28
  Administered 2015-02-28: 20 mL via INTRADERMAL

## 2015-02-28 SURGICAL SUPPLY — 5 items
FCP FG STRG 5.5XNS LF DISP (INSTRUMENTS) ×1
FORCEPS FG STRG 5.5XNS LF DISP (INSTRUMENTS) ×1 IMPLANT
FORCEPS KELLY 5.5 STR (INSTRUMENTS) ×2
PREP CHG 10.5 TEAL (MISCELLANEOUS) ×9 IMPLANT
TRAY LACERAT/PLASTIC (MISCELLANEOUS) ×3 IMPLANT

## 2015-02-28 NOTE — Discharge Instructions (Signed)
°  Resume your regular medications as prescribed by your doctor.  Avoid aspirin for 24 hours.    Change the Band-Aid or dressing as needed.  After one days no dressing is needed.  Avoid strenuous activity for the remainder of the day.  Please notify your primary physician immediately if you have any unusual bleeding, trouble breathing, fever >100 degrees or pain not relieved by the medication your doctor prescribed for your doctor prescribed for you physician  Return to diaslysis  tommorow.

## 2015-02-28 NOTE — Op Note (Signed)
Operative Note     Preoperative diagnosis:   1. ESRD with functional permanent access  Postoperative diagnosis:  1. ESRD with functional permanent access  Procedure:  Removal of right jugular Permcath  Surgeon:  Leotis Pain, MD  Anesthesia:  Local  EBL:  Minimal  Indication for the Procedure:  The patient has a functional permanent dialysis access and no longer needs their permcath.  This can be removed.  Risks and benefits are discussed and informed consent is obtained.  Description of the Procedure:  The patient's right neck, chest and existing catheter were sterilely prepped and draped. The area around the catheter was anesthetized copiously with 1% lidocaine. The catheter was dissected out with curved hemostats until the cuff was freed from the surrounding fibrous sheath. The fiber sheath was transected, and the catheter was then removed in its entirety using gentle traction. Pressure was held and sterile dressings were placed. The patient tolerated the procedure well and was taken to the recovery room in stable condition.     Hansika Leaming  02/28/2015, 9:23 AM

## 2015-02-28 NOTE — H&P (Signed)
[facility] Admission History & Physical  MRN : [id number]  Todd Brown is a 54 y.o. (25-Aug-1960) male who presents with chief complaint of No chief complaint on file. Marland Kitchen  History of Present Illness: Patient with end-stage renal disease now with a functional AV fistula in the left arm. He is ready to have his PermCath removed. He has no other complaints today.  No current facility-administered medications for this encounter.    Past Medical History  Diagnosis Date  . ESRD (end stage renal disease)   . Malignant hypertension   . Schizophrenia   . Anemia   . Renal artery stenosis   . Diabetes mellitus without complication   . Hyperlipidemia   . Dialysis patient     Mon. -Wed.- Fri    Past Surgical History  Procedure Laterality Date  . Repair fx left lower leg      yrs ago (age 89)  . Peripheral vascular catheterization N/A 10/22/2014    Procedure: Dialysis/Perma Catheter Insertion;  Surgeon: Algernon Huxley, MD;  Location: [place] [facility];  Service: Cardiovascular;  Laterality: N/A;  . Insertion of dialysis catheter Right   . Av fistula placement Left 12/26/2014    Procedure: ARTERIOVENOUS (AV) FISTULA CREATION;  Surgeon: Algernon Huxley, MD;  Location: [facility];  Service: Vascular;  Laterality: Left;    Social History Social History  Substance Use Topics  . Smoking status: Current Every Day Smoker -- 0.50 packs/day    Types: Cigarettes  . Smokeless tobacco: Never Used     Comment: 10 cigars a day  . Alcohol Use: No    Family History Family History  Problem Relation Age of Onset  . Diabetes Neg Hx   no bleeding disorders, clotting disorders, or autoimmune diseases  Allergies  Allergen Reactions  . Thorazine [Chlorpromazine] Other (See Comments)    Reaction:  Unknown      REVIEW OF SYSTEMS (Negative unless checked)  Constitutional: [] Weight loss  [] Fever  [] Chills Cardiac: [] Chest pain   [] Chest pressure   [] Palpitations    [] Shortness of breath when laying flat   [] Shortness of breath at rest   [] Shortness of breath with exertion. Vascular:  [] Pain in legs with walking   [] Pain in legs at rest   [] Pain in legs when laying flat   [] Claudication   [] Pain in feet when walking  [] Pain in feet at rest  [] Pain in feet when laying flat   [] History of DVT   [] Phlebitis   [] Swelling in legs   [] Varicose veins   [] Non-healing ulcers Pulmonary:   [] Uses home oxygen   [] Productive cough   [] Hemoptysis   [] Wheeze  [] COPD   [] Asthma Neurologic:  [] Dizziness  [] Blackouts   [] Seizures   [] History of stroke   [] History of TIA  [] Aphasia   [] Temporary blindness   [] Dysphagia   [] Weakness or numbness in arms   [] Weakness or numbness in legs Musculoskeletal:  [] Arthritis   [] Joint swelling   [] Joint pain   [] Low back pain Hematologic:  [] Easy bruising  [] Easy bleeding   [] Hypercoagulable state   [] Anemic  [] Hepatitis Gastrointestinal:  [] Blood in stool   [] Vomiting blood  [] Gastroesophageal reflux/heartburn   [] Difficulty swallowing. Genitourinary:  [x] Chronic kidney disease   [] Difficult urination  [] Frequent urination  [] Burning with urination   [] Blood in urine Skin:  [] Rashes   [] Ulcers   [] Wounds Psychological:  [] History of anxiety   []  History of major depression.  Physical Examination  Filed Vitals:   02/28/15 0718  BP: 129/72  Pulse: 72  Temp: 98.2 F (36.8 C)  TempSrc: Oral  Resp: 20  Height: 6\' 3"  (1.905 m)  Weight: 83.008 kg (183 lb)   Body mass index is 22.87 kg/(m^2). Gen: WD/WN, NAD Head: [place]/AT, No temporalis wasting. Prominent temp pulse not noted. Ear/Nose/Throat: Hearing grossly intact, nares w/o erythema or drainage, oropharynx w/o Erythema/Exudate,  Eyes: PERRLA, EOMI.  Neck: Supple, no nuchal rigidity.  No bruit or JVD.  Pulmonary:  Good air movement, clear to auscultation bilaterally, no use of accessory muscles.  Cardiac: RRR, normal S1, S2, no Murmurs, rubs or gallops. Vascular: thrill and bruit in  left arm AVF Vessel Right Left  Radial Palpable Palpable                                   Gastrointestinal: soft, non-tender/non-distended. No guarding/reflex.  Musculoskeletal: M/S 5/5 throughout.  Extremities without ischemic changes.  No deformity or atrophy.  Neurologic: CN 2-12 intact. Pain and light touch intact in extremities.  Symmetrical.  Speech is fluent. Motor exam as listed above. Psychiatric: Judgment intact, Mood & affect appropriate for pt's clinical situation. Dermatologic: No rashes or ulcers noted.  No cellulitis or open wounds. Lymph : No Cervical, Axillary, or Inguinal lymphadenopathy.      CBC Lab Results  Component Value Date   WBC 10.9* 01/21/2015   HGB 14.0 01/21/2015   HCT 42.0 01/21/2015   MCV 101.6* 01/21/2015   PLT 196 01/21/2015    BMET    Component Value Date/Time   NA 131* 01/21/2015 1852   K 3.7 01/21/2015 1852   CL 93* 01/21/2015 1852   CO2 27 01/21/2015 1852   GLUCOSE 109* 01/21/2015 1852   BUN 22* 01/21/2015 1852   CREATININE 4.70* 01/21/2015 1852   CREATININE 3.47* 03/23/2013 1501   CALCIUM 8.4* 01/21/2015 1852   CALCIUM 8.1* 10/22/2014 0504   GFRNONAA 13* 01/21/2015 1852   GFRAA 15* 01/21/2015 1852   CrCl cannot be calculated (Patient has no serum creatinine result on file.).  COAG Lab Results  Component Value Date   INR 0.95 12/06/2014   INR 0.97 03/23/2013    Radiology No results found.    Assessment/Plan 1. ESRD, now with functional access.  Can remove permcath 2. HTN. Stable. Continue outpatient meds 3. DM. Stable. Continue outpatient meds   DEW,JASON, MD  02/28/2015 9:07 AM

## 2015-03-01 ENCOUNTER — Encounter: Payer: Self-pay | Admitting: Vascular Surgery

## 2015-05-16 ENCOUNTER — Encounter: Payer: Self-pay | Admitting: Emergency Medicine

## 2015-05-16 ENCOUNTER — Emergency Department: Payer: Medicaid Other

## 2015-05-16 ENCOUNTER — Inpatient Hospital Stay
Admission: EM | Admit: 2015-05-16 | Discharge: 2015-05-20 | DRG: 640 | Disposition: A | Discharge status: Home / Self Care | Payer: Medicaid Other | Attending: Internal Medicine | Admitting: Internal Medicine

## 2015-05-16 DIAGNOSIS — I701 Atherosclerosis of renal artery: Secondary | ICD-10-CM | POA: Diagnosis present

## 2015-05-16 DIAGNOSIS — E875 Hyperkalemia: Principal | ICD-10-CM | POA: Diagnosis present

## 2015-05-16 DIAGNOSIS — Z9119 Patient's noncompliance with other medical treatment and regimen: Secondary | ICD-10-CM | POA: Diagnosis not present

## 2015-05-16 DIAGNOSIS — N186 End stage renal disease: Secondary | ICD-10-CM | POA: Diagnosis present

## 2015-05-16 DIAGNOSIS — Z888 Allergy status to other drugs, medicaments and biological substances status: Secondary | ICD-10-CM

## 2015-05-16 DIAGNOSIS — I10 Essential (primary) hypertension: Secondary | ICD-10-CM | POA: Diagnosis present

## 2015-05-16 DIAGNOSIS — Z9115 Patient's noncompliance with renal dialysis: Secondary | ICD-10-CM | POA: Diagnosis not present

## 2015-05-16 DIAGNOSIS — F209 Schizophrenia, unspecified: Secondary | ICD-10-CM | POA: Diagnosis present

## 2015-05-16 DIAGNOSIS — E119 Type 2 diabetes mellitus without complications: Secondary | ICD-10-CM | POA: Diagnosis present

## 2015-05-16 DIAGNOSIS — Z79899 Other long term (current) drug therapy: Secondary | ICD-10-CM

## 2015-05-16 DIAGNOSIS — N2581 Secondary hyperparathyroidism of renal origin: Secondary | ICD-10-CM | POA: Diagnosis present

## 2015-05-16 DIAGNOSIS — B1920 Unspecified viral hepatitis C without hepatic coma: Secondary | ICD-10-CM | POA: Diagnosis present

## 2015-05-16 DIAGNOSIS — E785 Hyperlipidemia, unspecified: Secondary | ICD-10-CM | POA: Diagnosis present

## 2015-05-16 DIAGNOSIS — D631 Anemia in chronic kidney disease: Secondary | ICD-10-CM | POA: Diagnosis present

## 2015-05-16 DIAGNOSIS — I120 Hypertensive chronic kidney disease with stage 5 chronic kidney disease or end stage renal disease: Secondary | ICD-10-CM | POA: Diagnosis present

## 2015-05-16 DIAGNOSIS — F17210 Nicotine dependence, cigarettes, uncomplicated: Secondary | ICD-10-CM | POA: Diagnosis present

## 2015-05-16 DIAGNOSIS — Z992 Dependence on renal dialysis: Secondary | ICD-10-CM

## 2015-05-16 DIAGNOSIS — N189 Chronic kidney disease, unspecified: Secondary | ICD-10-CM | POA: Diagnosis present

## 2015-05-16 LAB — CBC WITH DIFFERENTIAL/PLATELET
BASOS ABS: 0 10*3/uL (ref 0–0.1)
Basophils Relative: 1 %
EOS ABS: 0.1 10*3/uL (ref 0–0.7)
EOS PCT: 2 %
HCT: 30.9 % — ABNORMAL LOW (ref 40.0–52.0)
Hemoglobin: 10.2 g/dL — ABNORMAL LOW (ref 13.0–18.0)
LYMPHS PCT: 20 %
Lymphs Abs: 1.1 10*3/uL (ref 1.0–3.6)
MCH: 34.7 pg — ABNORMAL HIGH (ref 26.0–34.0)
MCHC: 33.1 g/dL (ref 32.0–36.0)
MCV: 105 fL — AB (ref 80.0–100.0)
Monocytes Absolute: 0.8 10*3/uL (ref 0.2–1.0)
Monocytes Relative: 14 %
Neutro Abs: 3.5 10*3/uL (ref 1.4–6.5)
Neutrophils Relative %: 63 %
PLATELETS: 287 10*3/uL (ref 150–440)
RBC: 2.94 MIL/uL — AB (ref 4.40–5.90)
RDW: 15.5 % — ABNORMAL HIGH (ref 11.5–14.5)
WBC: 5.6 10*3/uL (ref 3.8–10.6)

## 2015-05-16 LAB — COMPREHENSIVE METABOLIC PANEL
ALT: 17 U/L (ref 17–63)
ANION GAP: 17 — AB (ref 5–15)
AST: 17 U/L (ref 15–41)
Albumin: 3.3 g/dL — ABNORMAL LOW (ref 3.5–5.0)
Alkaline Phosphatase: 107 U/L (ref 38–126)
BUN: 127 mg/dL — ABNORMAL HIGH (ref 6–20)
CHLORIDE: 101 mmol/L (ref 101–111)
CO2: 19 mmol/L — ABNORMAL LOW (ref 22–32)
CREATININE: 12.64 mg/dL — AB (ref 0.61–1.24)
Calcium: 9.1 mg/dL (ref 8.9–10.3)
GFR, EST AFRICAN AMERICAN: 5 mL/min — AB (ref >60)
GFR, EST NON AFRICAN AMERICAN: 4 mL/min — AB (ref >60)
Glucose, Bld: 76 mg/dL (ref 65–99)
POTASSIUM: 6.4 mmol/L — AB (ref 3.5–5.1)
Sodium: 137 mmol/L (ref 135–145)
Total Bilirubin: 0.6 mg/dL (ref 0.3–1.2)
Total Protein: 6.1 g/dL — ABNORMAL LOW (ref 6.5–8.1)

## 2015-05-16 LAB — GLUCOSE, CAPILLARY
GLUCOSE-CAPILLARY: 71 mg/dL (ref 65–99)
Glucose-Capillary: 100 mg/dL — ABNORMAL HIGH (ref 65–99)

## 2015-05-16 LAB — ETHANOL

## 2015-05-16 MED ORDER — SODIUM CHLORIDE 0.9 % IV SOLN
1.0000 g | Freq: Once | INTRAVENOUS | Status: AC
  Administered 2015-05-16: 1 g via INTRAVENOUS
  Filled 2015-05-16: qty 10

## 2015-05-16 MED ORDER — SODIUM CHLORIDE 0.9 % IJ SOLN
3.0000 mL | Freq: Two times a day (BID) | INTRAMUSCULAR | Status: DC
  Administered 2015-05-17 – 2015-05-19 (×4): 3 mL via INTRAVENOUS

## 2015-05-16 MED ORDER — MORPHINE SULFATE (PF) 2 MG/ML IV SOLN: 2.0000 mg | INTRAVENOUS | Status: DC | PRN

## 2015-05-16 MED ORDER — HEPARIN SODIUM (PORCINE) 5000 UNIT/ML IJ SOLN
5000.0000 [IU] | Freq: Three times a day (TID) | INTRAMUSCULAR | Status: DC
  Administered 2015-05-17 – 2015-05-20 (×8): 5000 [IU] via SUBCUTANEOUS
  Filled 2015-05-16 (×8): qty 1

## 2015-05-16 MED ORDER — ONDANSETRON HCL 4 MG/2ML IJ SOLN: 4.0000 mg | Freq: Four times a day (QID) | INTRAMUSCULAR | Status: DC | PRN

## 2015-05-16 MED ORDER — SODIUM BICARBONATE 8.4 % IV SOLN
50.0000 meq | Freq: Once | INTRAVENOUS | Status: AC
  Administered 2015-05-16: 50 meq via INTRAVENOUS
  Filled 2015-05-16: qty 50

## 2015-05-16 MED ORDER — ACETAMINOPHEN 650 MG RE SUPP: 650.0000 mg | Freq: Four times a day (QID) | RECTAL | Status: DC | PRN

## 2015-05-16 MED ORDER — SODIUM POLYSTYRENE SULFONATE 15 GM/60ML PO SUSP
30.0000 g | Freq: Once | ORAL | Status: AC
  Administered 2015-05-16: 30 g via ORAL
  Filled 2015-05-16: qty 120

## 2015-05-16 MED ORDER — OXYCODONE HCL 5 MG PO TABS: 5.0000 mg | ORAL_TABLET | ORAL | Status: DC | PRN

## 2015-05-16 MED ORDER — DEXTROSE 50 % IV SOLN
1.0000 | Freq: Once | INTRAVENOUS | Status: AC
  Administered 2015-05-16: 50 mL via INTRAVENOUS
  Filled 2015-05-16: qty 50

## 2015-05-16 MED ORDER — FUROSEMIDE 10 MG/ML IJ SOLN
80.0000 mg | Freq: Once | INTRAMUSCULAR | Status: AC
  Administered 2015-05-16: 80 mg via INTRAVENOUS
  Filled 2015-05-16: qty 8

## 2015-05-16 MED ORDER — ACETAMINOPHEN 325 MG PO TABS: 650.0000 mg | ORAL_TABLET | Freq: Four times a day (QID) | ORAL | Status: DC | PRN

## 2015-05-16 MED ORDER — ONDANSETRON HCL 4 MG PO TABS: 4.0000 mg | ORAL_TABLET | Freq: Four times a day (QID) | ORAL | Status: DC | PRN

## 2015-05-16 MED ORDER — INSULIN ASPART 100 UNIT/ML ~~LOC~~ SOLN
8.0000 [IU] | Freq: Once | SUBCUTANEOUS | Status: AC
  Administered 2015-05-16: 8 [IU] via SUBCUTANEOUS
  Filled 2015-05-16: qty 8

## 2015-05-16 NOTE — ED Provider Notes (Addendum)
[facility] Emergency Department Provider Note  ____________________________________________   I have reviewed the triage vital signs and the nursing notes.   HISTORY  Chief Complaint Psychiatric Evaluation    HPI Todd Brown is a 54 y.o. male who is sent here under IVC because apparently he has been refusing to get dialysis patient patient has a history of schizophrenia. He has no SI or HI. He cannot fully explain why he does not like going to dialysis she states she does not like it. He does still make urine. It is unclear when the last time he made it to dialysis was. He states it's been several episodes. Patient has no complaints. He denies on auditory or visual hallucinations.  Past Medical History  Diagnosis Date  . ESRD (end stage renal disease) ([place])   . Malignant hypertension   . Schizophrenia ([place])   . Anemia   . Renal artery stenosis ([place])   . Diabetes mellitus without complication ([place])   . Hyperlipidemia   . Dialysis patient [name])     Mon. -Wed.- Fri    Patient Active Problem List   Diagnosis Date Noted  . Acute kidney injury ([place]) 10/20/2014  . Hepatitis C 09/26/2013  . Schizophrenia ([place]) 03/23/2013  . Essential hypertension, benign 03/23/2013  . Chronic kidney disease 03/23/2013    Past Surgical History  Procedure Laterality Date  . Repair fx left lower leg      yrs ago (age 85)  . Peripheral vascular catheterization N/A 10/22/2014    Procedure: Dialysis/Perma Catheter Insertion;  Surgeon: Algernon Huxley, MD;  Location: [place] CV LAB;  Service: Cardiovascular;  Laterality: N/A;  . Insertion of dialysis catheter Right   . Av fistula placement Left 12/26/2014    Procedure: ARTERIOVENOUS (AV) FISTULA CREATION;  Surgeon: Algernon Huxley, MD;  Location: [facility] ORS;  Service: Vascular;  Laterality: Left;  . Peripheral vascular catheterization N/A 02/28/2015    Procedure: Dialysis/Perma Catheter Removal;  Surgeon: Algernon Huxley, MD;   Location: [place] CV LAB;  Service: Cardiovascular;  Laterality: N/A;    Current Outpatient Rx  Name  Route  Sig  Dispense  Refill  . amLODipine (NORVASC) 10 MG tablet   Oral   Take 1 tablet (10 mg total) by mouth at bedtime.   30 tablet   1   . atenolol (TENORMIN) 25 MG tablet   Oral   Take 1 tablet (25 mg total) by mouth 2 (two) times daily.   60 tablet   1   . baclofen (LIORESAL) 10 MG tablet   Oral   Take 10 mg by mouth 2 (two) times daily as needed for muscle spasms.         . calcium acetate (PHOSLO) 667 MG capsule   Oral   Take 2 capsules (1,334 mg total) by mouth 3 (three) times daily with meals. Patient not taking: Reported on 02/28/2015   60 capsule   1   . carbamazepine (TEGRETOL) 200 MG tablet   Oral   Take 2.5 tablets (500 mg total) by mouth 2 (two) times daily. Taken 2 1/2 tabs (500mg  ) twice a day   150 tablet   0   . cholecalciferol (VITAMIN D) 1000 UNITS tablet   Oral   Take 1,000 Units by mouth daily.         . citalopram (CELEXA) 20 MG tablet   Oral   Take 1 tablet (20 mg total) by mouth daily.   [place]  tablet   1   . cloNIDine (CATAPRES) 0.2 MG tablet   Oral   Take 1 tablet (0.2 mg total) by mouth 3 (three) times daily.   90 tablet   1   . Fe Fum-FA-B Cmp-C-Zn-Mg-Mn-Cu (FERROCITE PLUS PO)   Oral   Take by mouth daily.         . fluPHENAZine (PROLIXIN) 2.5 MG tablet   Oral   Take 2.5 mg by mouth daily.         . fluPHENAZine decanoate (PROLIXIN) 25 MG/ML injection   Intramuscular   Inject 37.5 mg into the muscle every 14 (fourteen) days.         . hydrALAZINE (APRESOLINE) 100 MG tablet   Oral   Take 1 tablet (100 mg total) by mouth every 6 (six) hours.   120 tablet   1   . HYDROcodone-acetaminophen (NORCO) 5-325 MG per tablet   Oral   Take 1 tablet by mouth every 6 (six) hours as needed for moderate pain. Patient not taking: Reported on 02/28/2015   30 tablet   0   . irbesartan (AVAPRO) 300 MG tablet   Oral    Take 1 tablet (300 mg total) by mouth daily. Patient not taking: Reported on 02/28/2015   30 tablet   1   . minoxidil (LONITEN) 2.5 MG tablet   Oral   Take 2 tablets (5 mg total) by mouth daily.   60 tablet   1   . omeprazole (PRILOSEC) 20 MG capsule   Oral   Take 20 mg by mouth daily.         Marland Kitchen oxyCODONE-acetaminophen (ROXICET) 5-325 MG per tablet   Oral   Take 1 tablet by mouth every 6 (six) hours as needed. Patient not taking: Reported on 02/28/2015   15 tablet   0   . traMADol (ULTRAM) 50 MG tablet   Oral   Take 50 mg by mouth every 6 (six) hours as needed for pain.         . trihexyphenidyl (ARTANE) 5 MG tablet   Oral   Take 5 mg by mouth 3 (three) times daily with meals.           Allergies Thorazine  Family History  Problem Relation Age of Onset  . Diabetes Neg Hx     Social History Social History  Substance Use Topics  . Smoking status: Current Every Day Smoker -- 0.50 packs/day    Types: Cigarettes  . Smokeless tobacco: Never Used     Comment: 10 cigars a day  . Alcohol Use: No    Review of Systems Constitutional: No fever/chills Eyes: No visual changes. ENT: No sore throat. No stiff neck no neck pain Cardiovascular: Denies chest pain. Respiratory: Maybe a little" shortness of breath. Gastrointestinal:   no vomiting.  No diarrhea.  No constipation. Genitourinary: Negative for dysuria. Musculoskeletal: Negative lower extremity swelling her baseline Skin: Negative for rash. Neurological: Negative for headaches, focal weakness or numbness. 10-point ROS otherwise negative.  ____________________________________________   PHYSICAL EXAM:  VITAL SIGNS: ED Triage Vitals  Enc Vitals Group     BP 05/16/15 2003 122/68 mmHg     Pulse Rate 05/16/15 2003 58     Resp 05/16/15 2003 16     Temp 05/16/15 2003 98.1 F (36.7 C)     Temp Source 05/16/15 2003 Oral     SpO2 05/16/15 2003 98 %     Weight 05/16/15 2003 175 lb (79.379 kg)  Height  05/16/15 2003 6\' 3"  (1.905 m)     Head Cir --      Peak Flow --      Pain Score 05/16/15 2004 0     Pain Loc --      Pain Edu? --      Excl. in [place]? --     Constitutional: Alert and oriented. Well appearing and in no acute distress. Eyes: Conjunctivae are normal. PERRL. EOMI. Head: Atraumatic. Nose: No congestion/rhinnorhea. Mouth/Throat: Mucous membranes are moist.  Oropharynx non-erythematous. Neck: No stridor.   Nontender with no meningismus Cardiovascular: Normal rate, regular rhythm. Grossly normal heart sounds.  Good peripheral circulation. Respiratory: Normal respiratory effort.  No retractions. Perhaps a slight occasional bibasilar rale noted Abdominal: Soft and nontender. No distention. No guarding no rebound Back:  There is no focal tenderness or step off there is no midline tenderness there are no lesions noted. there is no CVA tenderness Musculoskeletal: No lower extremity tenderness. No joint effusions, no DVT signs strong distal pulses no edema, left dialysis shunt noted Neurologic:  Normal speech and language. No gross focal neurologic deficits are appreciated.  Skin:  Skin is warm, dry and intact. No rash noted. Psychiatric: Mood and affect are normal. Speech and behavior are normal.  ____________________________________________   LABS (all labs ordered are listed, but only abnormal results are displayed)  Labs Reviewed  CBC WITH DIFFERENTIAL/PLATELET - Abnormal; Notable for the following:    RBC 2.94 (*)    Hemoglobin 10.2 (*)    HCT 30.9 (*)    MCV 105.0 (*)    MCH 34.7 (*)    RDW 15.5 (*)    All other components within normal limits  COMPREHENSIVE METABOLIC PANEL  ETHANOL  URINE DRUG SCREEN, QUALITATIVE ([facility] ONLY)  URINALYSIS COMPLETEWITH MICROSCOPIC ([facility] ONLY)   ____________________________________________  EKG  I personally interpreted any EKGs ordered by me or triage Normal sinus rhythm rate 66 bpm, baseline artifact in the lateral leads  disrupts interpretation but no obvious ischemic changes noted. ____________________________________________  [id number]  I reviewed any imaging ordered by me or triage that were performed during my shift ____________________________________________   PROCEDURES  Procedure(s) performed: None  Critical Care performed: ----------------------------------------- 10:23 PM on 05/16/2015 -----------------------------------------  CRITICAL CARE Performed by: Gerda Diss MCSHANE   Total critical care time: 38 minutes  Critical care time was exclusive of separately billable procedures and treating other patients.  Critical care was necessary to treat or prevent imminent or life-threatening deterioration.  Critical care was time spent personally by me on the following activities: development of treatment plan with patient and/or surrogate as well as nursing, discussions with consultants, evaluation of patient's response to treatment, examination of patient, obtaining history from patient or surrogate, ordering and performing treatments and interventions, ordering and review of laboratory studies, ordering and review of radiographic studies, pulse oximetry and re-evaluation of patient's condition.   ____________________________________________   INITIAL IMPRESSION / ASSESSMENT AND PLAN / ED COURSE  Pertinent labs & imaging results that were available during my care of the patient were reviewed by me and considered in my medical decision making (see chart for details).  Patient with IVC paperwork in place because of refusal to go to dialysis. History of schizophrenia. Unclear what degree of insight he has into his refusal and its risk for his bodies. We are checking his potassium level, vital signs are reassuring. We will further assess need for medical intervention based on his blood work. Sats are good and  lungs are at this time reassuring ----------------------------------------- 10:23 PM on  05/16/2015 -----------------------------------------  Patient potassium is 6.4 which is dangerously high but no evidence of acute decompensation. We are treating him aggressively. He will be admitted to the hospital. IVC is completed. ____________________________________________   FINAL CLINICAL IMPRESSION(S) / ED DIAGNOSES  Final diagnoses:  None     Schuyler Amor, MD 05/16/15 2123  Schuyler Amor, MD 05/16/15 2223

## 2015-05-16 NOTE — BHH Counselor (Signed)
Pt is to be admitted to medical floor (per pt RN Altha Harm & EDP Dr.McShane note). Pt is not cleared for TTS consult at this time.

## 2015-05-16 NOTE — ED Notes (Signed)
Pt arrived to the ED via [facility] under IVC for not being compliant with his dialysis treatment. Pt's dialysis nurse took IVC paper on the Pt because he was refusing dialysis treatment. Pt denies SI and HI cooperative in triage.

## 2015-05-16 NOTE — ED Notes (Signed)
Pt taken to xray accompanied by ODS officer

## 2015-05-16 NOTE — ED Notes (Signed)
Patient was dressed out into the required purple scrubs. He was then taken to hallway 19 bed. Nurse notified. His belongings were left beside the nurses desk. The nurse also aware of this.

## 2015-05-16 NOTE — BHH Counselor (Signed)
TTTS unable to complete consult. Consult entered while Pt still in waiting area. Pt has not been seen by EDP.

## 2015-05-16 NOTE — ED Notes (Signed)
ED BHU [place] Is the patient under IVC or is there intent for IVC: Yes.   Is the patient medically cleared: No. Is there vacancy in the ED BHU: Yes.   Is the population mix appropriate for patient: Yes.   Is the patient awaiting placement in inpatient or outpatient setting: No. Has the patient had a psychiatric consult: No. Survey of unit performed for contraband, proper placement and condition of furniture, tampering with fixtures in bathroom, shower, and each patient room: Yes.  ; Findings:  APPEARANCE/BEHAVIOR calm and cooperative NEURO ASSESSMENT Orientation: time, place and person Hallucinations: No.None noted (Hallucinations) Speech: Normal Gait: normal RESPIRATORY ASSESSMENT Normal expansion.  Clear to auscultation.  No rales, rhonchi, or wheezing. CARDIOVASCULAR ASSESSMENT regular rate and rhythm, S1, S2 normal, no murmur, click, rub or gallop GASTROINTESTINAL ASSESSMENT soft, nontender, BS WNL, no r/g EXTREMITIES normal strength, tone, and muscle mass, no deformities, no erythema, induration, or nodules, no evidence of joint effusion, ROM of all joints is normal, no evidence of joint instability PLAN OF CARE Provide calm/safe environment. Vital signs assessed twice daily. ED BHU Assessment once each 12-hour shift. Collaborate with intake RN daily or as condition indicates. Assure the ED provider has rounded once each shift. Provide and encourage hygiene. Provide redirection as needed. Assess for escalating behavior; address immediately and inform ED provider.  Assess family dynamic and appropriateness for visitation as needed: No.; If necessary, describe findings: family unavailable Educate the patient/family about BHU procedures/visitation: No.; If necessary, describe findings: family unavailable

## 2015-05-17 DIAGNOSIS — E875 Hyperkalemia: Principal | ICD-10-CM

## 2015-05-17 LAB — GLUCOSE, CAPILLARY
GLUCOSE-CAPILLARY: 146 mg/dL — AB (ref 65–99)
Glucose-Capillary: 113 mg/dL — ABNORMAL HIGH (ref 65–99)
Glucose-Capillary: 181 mg/dL — ABNORMAL HIGH (ref 65–99)
Glucose-Capillary: 49 mg/dL — ABNORMAL LOW (ref 65–99)
Glucose-Capillary: 89 mg/dL (ref 65–99)
Glucose-Capillary: 99 mg/dL (ref 65–99)

## 2015-05-17 LAB — URINALYSIS COMPLETE WITH MICROSCOPIC (ARMC ONLY)
Bilirubin Urine: NEGATIVE
GLUCOSE, UA: NEGATIVE mg/dL
Ketones, ur: NEGATIVE mg/dL
NITRITE: NEGATIVE
PH: 7 (ref 5.0–8.0)
Protein, ur: 30 mg/dL — AB
SPECIFIC GRAVITY, URINE: 1.009 (ref 1.005–1.030)

## 2015-05-17 LAB — URINE DRUG SCREEN, QUALITATIVE (ARMC ONLY)
AMPHETAMINES, UR SCREEN: NOT DETECTED
Barbiturates, Ur Screen: NOT DETECTED
Benzodiazepine, Ur Scrn: NOT DETECTED
COCAINE METABOLITE, UR ~~LOC~~: NOT DETECTED
Cannabinoid 50 Ng, Ur ~~LOC~~: NOT DETECTED
MDMA (ECSTASY) UR SCREEN: NOT DETECTED
METHADONE SCREEN, URINE: NOT DETECTED
Opiate, Ur Screen: NOT DETECTED
Phencyclidine (PCP) Ur S: NOT DETECTED
TRICYCLIC, UR SCREEN: NOT DETECTED

## 2015-05-17 LAB — BASIC METABOLIC PANEL
Anion gap: 16 — ABNORMAL HIGH (ref 5–15)
Anion gap: 17 — ABNORMAL HIGH (ref 5–15)
BUN: 128 mg/dL — AB (ref 6–20)
BUN: 131 mg/dL — AB (ref 6–20)
CALCIUM: 9 mg/dL (ref 8.9–10.3)
CHLORIDE: 101 mmol/L (ref 101–111)
CO2: 22 mmol/L (ref 22–32)
CO2: 20 mmol/L — ABNORMAL LOW (ref 22–32)
CREATININE: 12.79 mg/dL — AB (ref 0.61–1.24)
CREATININE: 13.24 mg/dL — AB (ref 0.61–1.24)
Calcium: 9.3 mg/dL (ref 8.9–10.3)
Chloride: 101 mmol/L (ref 101–111)
GFR calc Af Amer: 4 mL/min — ABNORMAL LOW (ref >60)
GFR calc Af Amer: 4 mL/min — ABNORMAL LOW (ref >60)
GFR calc non Af Amer: 4 mL/min — ABNORMAL LOW (ref >60)
GFR, EST NON AFRICAN AMERICAN: 4 mL/min — AB (ref >60)
GLUCOSE: 145 mg/dL — AB (ref 65–99)
GLUCOSE: 46 mg/dL — AB (ref 65–99)
Potassium: 5.4 mmol/L — ABNORMAL HIGH (ref 3.5–5.1)
Potassium: 6.4 mmol/L — ABNORMAL HIGH (ref 3.5–5.1)
SODIUM: 139 mmol/L (ref 135–145)
Sodium: 138 mmol/L (ref 135–145)

## 2015-05-17 LAB — CBC
HCT: 32.8 % — ABNORMAL LOW (ref 40.0–52.0)
HEMOGLOBIN: 10.8 g/dL — AB (ref 13.0–18.0)
MCH: 34.7 pg — AB (ref 26.0–34.0)
MCHC: 33 g/dL (ref 32.0–36.0)
MCV: 105.2 fL — ABNORMAL HIGH (ref 80.0–100.0)
Platelets: 302 10*3/uL (ref 150–440)
RBC: 3.12 MIL/uL — ABNORMAL LOW (ref 4.40–5.90)
RDW: 15.6 % — ABNORMAL HIGH (ref 11.5–14.5)
WBC: 6.2 10*3/uL (ref 3.8–10.6)

## 2015-05-17 LAB — PHOSPHORUS: Phosphorus: 7.9 mg/dL — ABNORMAL HIGH (ref 2.5–4.6)

## 2015-05-17 MED ORDER — CARBAMAZEPINE 200 MG PO TABS
500.0000 mg | ORAL_TABLET | Freq: Two times a day (BID) | ORAL | Status: DC
  Administered 2015-05-17 – 2015-05-19 (×4): 500 mg via ORAL
  Filled 2015-05-17: qty 2.5
  Filled 2015-05-17: qty 3
  Filled 2015-05-17: qty 2.5
  Filled 2015-05-17 (×3): qty 3

## 2015-05-17 MED ORDER — ALTEPLASE 2 MG IJ SOLR
2.0000 mg | Freq: Once | INTRAMUSCULAR | Status: DC | PRN
  Filled 2015-05-17: qty 2

## 2015-05-17 MED ORDER — HYDRALAZINE HCL 50 MG PO TABS
100.0000 mg | ORAL_TABLET | Freq: Four times a day (QID) | ORAL | Status: DC
  Administered 2015-05-17 – 2015-05-20 (×11): 100 mg via ORAL
  Filled 2015-05-17 (×12): qty 2

## 2015-05-17 MED ORDER — VITAMIN D 1000 UNITS PO TABS
1000.0000 [IU] | ORAL_TABLET | Freq: Every day | ORAL | Status: DC
  Administered 2015-05-18 – 2015-05-19 (×2): 1000 [IU] via ORAL
  Filled 2015-05-17 (×2): qty 1

## 2015-05-17 MED ORDER — SODIUM CHLORIDE 0.9 % IV SOLN: 100.0000 mL | INTRAVENOUS | Status: DC | PRN

## 2015-05-17 MED ORDER — PANTOPRAZOLE SODIUM 40 MG PO TBEC
40.0000 mg | DELAYED_RELEASE_TABLET | Freq: Every day | ORAL | Status: DC
  Administered 2015-05-18 – 2015-05-19 (×2): 40 mg via ORAL
  Filled 2015-05-17 (×2): qty 1

## 2015-05-17 MED ORDER — FERROUS SULFATE 325 (65 FE) MG PO TABS
325.0000 mg | ORAL_TABLET | Freq: Every day | ORAL | Status: DC
  Administered 2015-05-18 – 2015-05-19 (×2): 325 mg via ORAL
  Filled 2015-05-17 (×2): qty 1

## 2015-05-17 MED ORDER — TRAMADOL HCL 50 MG PO TABS
50.0000 mg | ORAL_TABLET | Freq: Four times a day (QID) | ORAL | Status: DC | PRN
  Administered 2015-05-18 – 2015-05-19 (×2): 50 mg via ORAL
  Filled 2015-05-17 (×2): qty 1

## 2015-05-17 MED ORDER — FLUPHENAZINE HCL 2.5 MG PO TABS
2.5000 mg | ORAL_TABLET | Freq: Every day | ORAL | Status: DC
  Administered 2015-05-17 – 2015-05-19 (×3): 2.5 mg via ORAL
  Filled 2015-05-17 (×5): qty 1

## 2015-05-17 MED ORDER — CLONIDINE HCL 0.1 MG PO TABS
0.2000 mg | ORAL_TABLET | Freq: Three times a day (TID) | ORAL | Status: DC
  Administered 2015-05-17 – 2015-05-19 (×7): 0.2 mg via ORAL
  Filled 2015-05-17 (×7): qty 2

## 2015-05-17 MED ORDER — TRIHEXYPHENIDYL HCL 2 MG PO TABS
5.0000 mg | ORAL_TABLET | Freq: Three times a day (TID) | ORAL | Status: DC
  Administered 2015-05-17 – 2015-05-20 (×8): 5 mg via ORAL
  Filled 2015-05-17: qty 1
  Filled 2015-05-17 (×3): qty 3
  Filled 2015-05-17 (×6): qty 1
  Filled 2015-05-17 (×2): qty 3
  Filled 2015-05-17 (×2): qty 1
  Filled 2015-05-17: qty 3

## 2015-05-17 MED ORDER — INSULIN ASPART 100 UNIT/ML ~~LOC~~ SOLN
0.0000 [IU] | Freq: Three times a day (TID) | SUBCUTANEOUS | Status: DC
  Administered 2015-05-18: 2 [IU] via SUBCUTANEOUS
  Administered 2015-05-20: 1 [IU] via SUBCUTANEOUS
  Filled 2015-05-17: qty 2
  Filled 2015-05-17: qty 1

## 2015-05-17 MED ORDER — CITALOPRAM HYDROBROMIDE 20 MG PO TABS
20.0000 mg | ORAL_TABLET | Freq: Every day | ORAL | Status: DC
  Administered 2015-05-17 – 2015-05-19 (×3): 20 mg via ORAL
  Filled 2015-05-17 (×3): qty 1

## 2015-05-17 MED ORDER — PNEUMOCOCCAL VAC POLYVALENT 25 MCG/0.5ML IJ INJ
0.5000 mL | INJECTION | INTRAMUSCULAR | Status: AC
  Administered 2015-05-18: 0.5 mL via INTRAMUSCULAR
  Filled 2015-05-17: qty 0.5

## 2015-05-17 MED ORDER — HYDRALAZINE HCL 20 MG/ML IJ SOLN
10.0000 mg | INTRAMUSCULAR | Status: DC | PRN
  Administered 2015-05-17: 10 mg via INTRAVENOUS
  Filled 2015-05-17: qty 1

## 2015-05-17 MED ORDER — BACLOFEN 10 MG PO TABS: 10.0000 mg | ORAL_TABLET | Freq: Two times a day (BID) | ORAL | Status: DC | PRN

## 2015-05-17 MED ORDER — INFLUENZA VAC SPLIT QUAD 0.5 ML IM SUSY
0.5000 mL | PREFILLED_SYRINGE | INTRAMUSCULAR | Status: AC
  Administered 2015-05-18: 0.5 mL via INTRAMUSCULAR
  Filled 2015-05-17: qty 0.5

## 2015-05-17 MED ORDER — HEPARIN SODIUM (PORCINE) 1000 UNIT/ML IJ SOLN
1000.0000 [IU] | INTRAMUSCULAR | Status: DC | PRN
  Filled 2015-05-17: qty 1

## 2015-05-17 MED ORDER — PENTAFLUOROPROP-TETRAFLUOROETH EX AERO: 1.0000 "application " | INHALATION_SPRAY | CUTANEOUS | Status: DC | PRN

## 2015-05-17 MED ORDER — FERROCITE PLUS 106-1 MG PO TABS: ORAL_TABLET | Freq: Every day | ORAL | Status: DC

## 2015-05-17 MED ORDER — CALCIUM ACETATE (PHOS BINDER) 667 MG PO CAPS
1334.0000 mg | ORAL_CAPSULE | Freq: Three times a day (TID) | ORAL | Status: DC
  Administered 2015-05-17 – 2015-05-20 (×8): 1334 mg via ORAL
  Filled 2015-05-17 (×8): qty 2

## 2015-05-17 MED ORDER — ATENOLOL 25 MG PO TABS
25.0000 mg | ORAL_TABLET | Freq: Two times a day (BID) | ORAL | Status: DC
  Administered 2015-05-17 – 2015-05-19 (×4): 25 mg via ORAL
  Filled 2015-05-17 (×4): qty 1

## 2015-05-17 MED ORDER — AMLODIPINE BESYLATE 10 MG PO TABS
10.0000 mg | ORAL_TABLET | Freq: Every day | ORAL | Status: DC
  Administered 2015-05-17 – 2015-05-19 (×3): 10 mg via ORAL
  Filled 2015-05-17 (×3): qty 1

## 2015-05-17 MED ORDER — LIDOCAINE-PRILOCAINE 2.5-2.5 % EX CREA: 1.0000 "application " | TOPICAL_CREAM | CUTANEOUS | Status: DC | PRN

## 2015-05-17 MED ORDER — MINOXIDIL 10 MG PO TABS
5.0000 mg | ORAL_TABLET | Freq: Every day | ORAL | Status: DC
  Administered 2015-05-17 – 2015-05-19 (×3): 5 mg via ORAL
  Filled 2015-05-17 (×5): qty 1

## 2015-05-17 MED ORDER — LIDOCAINE HCL (PF) 1 % IJ SOLN
5.0000 mL | INTRAMUSCULAR | Status: DC | PRN
  Filled 2015-05-17: qty 5

## 2015-05-17 MED ORDER — FOLIC ACID 1 MG PO TABS
1.0000 mg | ORAL_TABLET | Freq: Every day | ORAL | Status: DC
  Administered 2015-05-18 – 2015-05-19 (×2): 1 mg via ORAL
  Filled 2015-05-17 (×2): qty 1

## 2015-05-17 NOTE — Progress Notes (Signed)
Inpatient Diabetes Program Recommendations  AACE/ADA: New Consensus Statement on Inpatient Glycemic Control (2015)  Target Ranges:  Prepandial:   less than 140 mg/dL      Peak postprandial:   less than 180 mg/dL (1-2 hours)      Critically ill patients:  140 - 180 mg/dL   Review of Glycemic Control  Results for REDMOND, CLARE (MRN [id number]) as of 05/17/2015 12:57  Ref. Range 05/16/2015 23:33 05/17/2015 00:49 05/17/2015 01:57 05/17/2015 03:40 05/17/2015 04:36  Glucose-Capillary Latest Ref Range: 65-99 mg/dL 100 (H) 49 (L) 113 (H) 181 (H) 146 (H)  **Low blood sugar as a result of 8 units Novolog insulin   Outpatient Diabetes medications: unknown Current orders for Inpatient glycemic control: none- no orders for blood sugar checks.   Inpatient Diabetes Program Recommendations: consider starting Novolog sensitive correction 0-9 units tid with meals, 0-5 units qhs; blood sugar checks tid and hs.    Gentry Fitz, RN, BA, MHA, CDE Diabetes Coordinator Inpatient Diabetes Program  [phone] (Team Pager) [phone] ([place]) 05/17/2015 1:01 PM

## 2015-05-17 NOTE — Progress Notes (Signed)
[facility]  ROUNDING NOTE   Subjective:  Patient very well known to [place] as an outpatient. He has missed more than a week of hemodialysis. We have been working with his psychiatristto get him to come to the hospital for reinitiation of dialysis. Unfortunately the patient had to be placed on involuntary commitment as he did not realize the dangers of missing dialysis. Today he appears to be pleasant and is agreeable to proceed with hemodialysis. Potassium is high at 6.4. BUN also quite high at 131 with a creatinine of 12.79.   Objective:  Vital signs in last 24 hours:  Temp:  [97.7 F (36.5 C)-98.1 F (36.7 C)] 97.7 F (36.5 C) (12/16 0428) Pulse Rate:  [56-68] 65 (12/16 0428) Resp:  [10-20] 20 (12/16 0428) BP: (122-188)/(68-100) 164/84 mmHg (12/16 0428) SpO2:  [92 %-98 %] 97 % (12/16 0428) Weight:  [79.379 kg (175 lb)-90.901 kg (200 lb 6.4 oz)] 90.901 kg (200 lb 6.4 oz) (12/16 0428)  Weight change:  Filed Weights   05/16/15 2003 05/17/15 0428  Weight: 79.379 kg (175 lb) 90.901 kg (200 lb 6.4 oz)    Intake/Output:     Intake/Output this shift:     Physical Exam: General: NAD  Head: Normocephalic, atraumatic. Moist oral mucosal membranes  Eyes: Anicteric, PERRL  Neck: Supple, trachea midline  Lungs:  Clear to auscultation normal effort  Heart: Regular rate and rhythm, no obvious pericardial friction rub  Abdomen:  Soft, nontender, BS present  Extremities:  1+ peripheral edema.  Neurologic: Nonfocal, moving all four extremities  Skin: No lesions  Access: Left upper arm fistula, good bruit and thrill.    Basic Metabolic Panel:  Recent Labs Lab 05/16/15 2008 05/17/15 0053 05/17/15 0430  NA 137 139 138  K 6.4* 5.4* 6.4*  CL 101 101 101  CO2 19* 22 20*  GLUCOSE 76 46* 145*  BUN 127* 128* 131*  CREATININE 12.64* 13.24* 12.79*  CALCIUM 9.1 9.3 9.0    Liver Function Tests:  Recent Labs Lab 05/16/15 2008  AST 17  ALT 17  ALKPHOS 107   BILITOT 0.6  PROT 6.1*  ALBUMIN 3.3*   No results for input(s): LIPASE, AMYLASE in the last 168 hours. No results for input(s): AMMONIA in the last 168 hours.  CBC:  Recent Labs Lab 05/16/15 2008 05/17/15 0053  WBC 5.6 6.2  NEUTROABS 3.5  --   HGB 10.2* 10.8*  HCT 30.9* 32.8*  MCV 105.0* 105.2*  PLT 287 302    Cardiac Enzymes: No results for input(s): CKTOTAL, CKMB, CKMBINDEX, TROPONINI in the last 168 hours.  BNP: Invalid input(s): POCBNP  CBG:  Recent Labs Lab 05/16/15 2333 05/17/15 0049 05/17/15 0157 05/17/15 0340 05/17/15 0436  GLUCAP 100* 49* 113* 181* 146*    Microbiology: No results found for this or any previous visit.  Coagulation Studies: No results for input(s): LABPROT, INR in the last 72 hours.  Urinalysis:  Recent Labs  05/17/15 0012  COLORURINE YELLOW*  LABSPEC 1.009  PHURINE 7.0  GLUCOSEU NEGATIVE  HGBUR 1+*  BILIRUBINUR NEGATIVE  KETONESUR NEGATIVE  PROTEINUR 30*  NITRITE NEGATIVE  LEUKOCYTESUR 3+*      Imaging: Dg Chest 2 View  05/16/2015  CLINICAL DATA:  End-stage renal disease, shortness of breath, missed dialysis EXAM: CHEST  2 VIEW COMPARISON:  10/21/2014, 10/20/2014 FINDINGS: Cardiomegaly evident. Increased vascular and diffuse interstitial opacities compatible with mild edema pattern compared to 10/21/2014. No definite focal pneumonia, collapse or consolidation. No effusion or pneumothorax. Trachea  midline. No acute osseous finding. IMPRESSION: Cardiomegaly with vascular and diffuse interstitial prominence compatible with mild edema pattern/volume overload. No definite focal pneumonia or effusion. Electronically Signed   By: Jerilynn Mages.  Shick M.D.   On: 05/16/2015 21:50     Medications:     . amLODipine  10 mg Oral QHS  . atenolol  25 mg Oral BID  . calcium acetate  1,334 mg Oral TID WC  . carbamazepine  500 mg Oral BID  . cholecalciferol  1,000 Units Oral Daily  . citalopram  20 mg Oral Daily  . cloNIDine  0.2 mg Oral  TID  . ferrous sulfate  325 mg Oral Daily  . fluPHENAZine  2.5 mg Oral Daily  . folic acid  1 mg Oral Daily  . heparin  5,000 Units Subcutaneous 3 times per day  . hydrALAZINE  100 mg Oral 4 times per day  . [START ON 05/18/2015] Influenza vac split quadrivalent PF  0.5 mL Intramuscular Tomorrow-1000  . minoxidil  5 mg Oral Daily  . pantoprazole  40 mg Oral Daily  . [START ON 05/18/2015] pneumococcal 23 valent vaccine  0.5 mL Intramuscular Tomorrow-1000  . sodium chloride  3 mL Intravenous Q12H  . trihexyphenidyl  5 mg Oral TID WC   acetaminophen **OR** acetaminophen, baclofen, hydrALAZINE, morphine injection, ondansetron **OR** ondansetron (ZOFRAN) IV, traMADol  Assessment/ Plan:  54 y.o. male with schizophrenia, hypertension, hepatitis C, tobacco abuse, started dialysis 09/2014, ESRD, anemia of CKD, SHPTH.  1.  End-stage renal disease on hemodialysis Monday, Wednesday, Friday.  The patient has missed at least a week of hemodialysis.  It appears this is secondary to his underlying schizophrenia.  The patient unfortunately had to be involuntarily committed. -Since the patient has been a significant amount of dialysis we will need to reinitiate him to dialysis slowly.  Therefore he will likely need daily dialysis for the next several days.  This was explained to the patient and he verbalized understanding.  2.  Hyperkalemia.  Serum potassium found to be 6.4.  We will dialyze the patient against a 2 potassium bath.  Continue to monitor serum potassium.  3.  Secondary hyperparathyroidism.  We will check ADH and phosphorus with dialysis today.  Continue PhosLo at this point in time.  4.  Hypertension.  Blood pressure elevated at 164/84.  Volume likely playing some role.  Continue amlodipine, atenolol, clonidine, hydralazine, and minoxidil.  5.  Anemia of chronic kidney disease.  Hemoglobin currently 10.8.  Hold off on adding back Epogen for now.    LOS: 1 Armon Orvis 12/16/20161:09  PM

## 2015-05-17 NOTE — Progress Notes (Signed)
Post hd tx 

## 2015-05-17 NOTE — Progress Notes (Signed)
HD tx completed.

## 2015-05-17 NOTE — Clinical Social Work Note (Addendum)
Clinical Social Work Assessment  Patient Details  Name: Todd Brown MRN: [id number] Date of Birth: [birth year]  Date of referral:  05/17/15               Reason for consult:  Rule-out Psychosocial                Permission sought to share information with:    Permission granted to share information::     Name::        Agency::     Relationship::     Contact Information:     Housing/Transportation Living arrangements for the past 2 months:  Apartment Source of Information:  Patient, Medical Team Patient Interpreter Needed:  None Criminal Activity/Legal Involvement Pertinent to Current Situation/Hospitalization:    Significant Relationships:  Warehouse manager, Parents Lives with:  Self Do you feel safe going back to the place where you live?  Yes Need for family participation in patient care:  No (Coment)  Care giving concerns:  None at this time   Facilities manager / plan:  Patient currently lives in [address] apartments.  Is followed by [facility] for mental health and has a Care Coordinator Pasty Spillers [phone].  Patient ok for CSW to contact care coordinator to inform her he is in the hospital.  CSW will also provide patient with phone number of Pasty Spillers.  Per patient [facility] is his mental health provider and comes to to see him twice a week.  Patient receives dialysis MWF  Informed CSW he rides the [facility] to his appointments.  CSW discussed concerns of patient not wanting to go to dialysis, patient states he is willing to go and will get on the van Monday when it comes.  Patient does his own cooking and his mother also assist with cooking for him, mother lives in [place].    Employment status:  Disabled (Comment on whether or not currently receiving Disability) Insurance information:  Medicaid [place] PT Recommendations:  Not assessed at this time Information / Referral to community resources:   (none at this time)  Patient/Family's Response to care:   Patient was appreciative of talking with CSW.   Patient/Family's Understanding of and Emotional Response to Diagnosis, Current Treatment, and Prognosis:  Patient understands that he is under continued medical work up at this time.  Once medically stable he will discharge home.  Emotional Assessment Appearance:  Appears stated age Attitude/Demeanor/Rapport:    Affect (typically observed):  Accepting, Adaptable, Pleasant, Calm Orientation:  Oriented to Self, Oriented to Place, Oriented to  Time, Oriented to Situation Alcohol / Substance use:    Psych involvement (Current and /or in the community):  Outpatient Provider ([facility])  Discharge Needs  Concerns to be addressed:  Care Coordination, Discharge Planning Concerns Readmission within the last 30 days:  No Current discharge risk:  Chronically ill, Psychiatric Illness Barriers to Discharge:  Continued Medical Work up, No Barriers Identified   Maurine Cane, LCSW 05/17/2015, 2:42 PM

## 2015-05-17 NOTE — Progress Notes (Signed)
[facility] [facility] [place] NAME: Todd Brown    MR#:  [id number]  DATE OF BIRTH:  1961/01/08  SUBJECTIVE:  Sitter+ Came in with IVC papers since he refused HD for 1 week. K was elevated. Denies any complaints  REVIEW OF SYSTEMS:   Review of Systems  Constitutional: Negative for fever, chills and weight loss.  HENT: Negative for ear discharge, ear pain and nosebleeds.   Eyes: Negative for blurred vision, pain and discharge.  Respiratory: Negative for sputum production, shortness of breath, wheezing and stridor.   Cardiovascular: Negative for chest pain, palpitations, orthopnea and PND.  Gastrointestinal: Negative for nausea, vomiting, abdominal pain and diarrhea.  Genitourinary: Negative for urgency and frequency.  Musculoskeletal: Negative for back pain and joint pain.  Neurological: Negative for sensory change, speech change, focal weakness and weakness.  Psychiatric/Behavioral: Negative for depression and hallucinations. The patient is not nervous/anxious.   All other systems reviewed and are negative.  Tolerating Diet:yes Tolerating PT: not needed  DRUG ALLERGIES:   Allergies  Allergen Reactions  . Thorazine [Chlorpromazine] Other (See Comments)    Reaction:  Unknown , pt states it makes him feel real bad    VITALS:  Blood pressure 164/84, pulse 65, temperature 97.7 F (36.5 C), temperature source Oral, resp. rate 20, height 6\' 3"  (1.905 m), weight 200 lb 6.4 oz (90.901 kg), SpO2 97 %.  PHYSICAL EXAMINATION:   Physical Exam  GENERAL:  54 y.o.-year-old patient lying in the bed with no acute distress.  EYES: Pupils equal, round, reactive to light and accommodation. No scleral icterus. Extraocular muscles intact.  HEENT: Head atraumatic, normocephalic. Oropharynx and nasopharynx clear.  NECK:  Supple, no jugular venous distention. No thyroid enlargement, no tenderness.  LUNGS: Normal breath sounds bilaterally, no  wheezing, rales, rhonchi. No use of accessory muscles of respiration.  CARDIOVASCULAR: S1, S2 normal. No murmurs, rubs, or gallops.  ABDOMEN: Soft, nontender, nondistended. Bowel sounds present. No organomegaly or mass.  EXTREMITIES: No cyanosis, clubbing or edema b/l.    NEUROLOGIC: Cranial nerves II through XII are intact. No focal Motor or sensory deficits b/l.   PSYCHIATRIC: The patient is alert and oriented x 3.  SKIN: No obvious rash, lesion, or ulcer.    LABORATORY PANEL:   CBC  Recent Labs Lab 05/17/15 0053  WBC 6.2  HGB 10.8*  HCT 32.8*  PLT 302    Chemistries   Recent Labs Lab 05/16/15 2008  05/17/15 0430  NA 137  < > 138  K 6.4*  < > 6.4*  CL 101  < > 101  CO2 19*  < > 20*  GLUCOSE 76  < > 145*  BUN 127*  < > 131*  CREATININE 12.64*  < > 12.79*  CALCIUM 9.1  < > 9.0  AST 17  --   --   ALT 17  --   --   ALKPHOS 107  --   --   BILITOT 0.6  --   --   < > = values in this interval not displayed.  Cardiac Enzymes No results for input(s): TROPONINI in the last 168 hours.  RADIOLOGY:  Dg Chest 2 View  05/16/2015  CLINICAL DATA:  End-stage renal disease, shortness of breath, missed dialysis EXAM: CHEST  2 VIEW COMPARISON:  10/21/2014, 10/20/2014 FINDINGS: Cardiomegaly evident. Increased vascular and diffuse interstitial opacities compatible with mild edema pattern compared to 10/21/2014. No definite focal pneumonia, collapse or consolidation. No effusion or pneumothorax.  Trachea midline. No acute osseous finding. IMPRESSION: Cardiomegaly with vascular and diffuse interstitial prominence compatible with mild edema pattern/volume overload. No definite focal pneumonia or effusion. Electronically Signed   By: Jerilynn Mages.  Shick M.D.   On: 05/16/2015 21:50     ASSESSMENT AND PLAN:    54 year old African-American gentleman history of end-stage renal disease on dialysis presenting with refusal to continue HD  1. Hyperkalemia has been treated appropriately in the emergency  department -K today 6.4 again. For HD today  2. End-stage renal disease on hemodialysis: He does have some symptoms of volume overload mainly lower extremity edema  -consult nephrology for continuation of dialysis/restarting of dialysis  3. Essential hypertension continue with atenolol, clonidine, hydralazine as well as other home medications with the addition of as needed hydralazine  4. Venous thromboembolism prophylactic: Heparin subcutaneous  5.h/o schizophrenia Will get inpt psych evaluation. Pt came in with IVC papers due to refusal for HD for past 1 week. Pt although tells me that he has learnt a lesson.   Case discussed with Care Management/Social Worker. Management plans discussed with the patient, family and they are in agreement.  CODE STATUS: full  DVT Prophylaxis: heparin TOTAL TIME TAKING CARE OF THIS PATIENT: 39minutes.  >50% time spent on counselling and coordination of care with dr Holley Raring  POSSIBLE D/C IN 1DAYS, DEPENDING ON CLINICAL CONDITION.   Todd Brown M.D on 05/17/2015 at 11:28 AM  Between 7am to 6pm - Pager - [phone]  After 6pm go to [email or web address] - password [email or web address] [facility]  [facility]  Office  [phone]  CC: Primary care physician; No PCP Per Patient

## 2015-05-17 NOTE — Progress Notes (Signed)
Patient unable to verify medications. Called [facility] at [phone], Per Rise Paganini patient no longer lives at her facility, patient moved out over a year ago. Beverly requested to be removed from contact list. Called the mother Elodia Florence at [phone], no answer. Per patient he is being followed by the [facility] team. Patient has no contact name or number.

## 2015-05-17 NOTE — Progress Notes (Signed)
Pre-hd tx 

## 2015-05-17 NOTE — Care Management (Signed)
Patient presents with hyperkalemia after he has refused his dialysis for reportedly 1 week.  Patient is under IVC placed his dialysis nurse.  Patient states that he lives at home alone in a government provided apartment.  Patient has been a member of the [facility] home in the past.  Patient states that he obtains his medication from [place], and receives income from social security.  Patient has an active with [facility].  CSW notified, and will let ACt team know.

## 2015-05-17 NOTE — Progress Notes (Signed)
HD tx start 

## 2015-05-17 NOTE — Progress Notes (Signed)
Initial Nutrition Assessment   INTERVENTION:   Meals and Snacks: Cater to patient preferences   NUTRITION DIAGNOSIS:   Altered nutrition lab value related to chronic illness as evidenced by  (hyperkalemia, ESRD on HD with missed HD > 1week).  GOAL:   Patient will meet greater than or equal to 90% of their needs  MONITOR:    (Energy Intake, Anthropometrics, Digestive System, Electrolyte/Renal Profile)  REASON FOR ASSESSMENT:   Diagnosis    ASSESSMENT:    Pt admitted with hyperkalemia after refusing dialysis for >1 week; pt is admitted under IVC; pt speaking with MD Clapacs on initial visit today, currently in dialysis  Past Medical History  Diagnosis Date  . ESRD (end stage renal disease) ([place])   . Malignant hypertension   . Schizophrenia ([place])   . Anemia   . Renal artery stenosis ([place])   . Diabetes mellitus without complication ([place])   . Hyperlipidemia   . Dialysis patient [facility])     Mon. -Wed.- Fri     Diet Order:  Diet renal with fluid restriction Fluid restriction:: 1200 mL Fluid; Room service appropriate?: Yes; Fluid consistency:: Thin   Energy Intake: recorded po intake 100% at breakfast this AM, per sitter, pt also ate 100% at lunch; appetite good per report  Last BM:  12/15   Electrolyte and Renal Profile:  Recent Labs Lab 05/16/15 2008 05/17/15 0053 05/17/15 0430  BUN 127* 128* 131*  CREATININE 12.64* 13.24* 12.79*  NA 137 139 138  K 6.4* 5.4* 6.4*   Glucose Profile:   Recent Labs  05/17/15 0157 05/17/15 0340 05/17/15 0436  GLUCAP 113* 181* 146*   Protein Profile:   Recent Labs Lab 05/16/15 2008  ALBUMIN 3.3*   Meds: lasix, novolog, kayexalate  Height:   Ht Readings from Last 1 Encounters:  05/16/15 6\' 3"  (1.905 m)    Weight:   Wt Readings from Last 1 Encounters:  05/17/15 202 lb 2.6 oz (91.7 kg)    Wt Readings from Last 10 Encounters:  05/17/15 202 lb 2.6 oz (91.7 kg)  02/28/15 183 lb (83.008 kg)  01/21/15 180 lb  (81.647 kg)  12/06/14 175 lb (79.379 kg)  10/31/14 178 lb 5.6 oz (80.9 kg)  09/26/13 177 lb 8 oz (80.513 kg)  03/23/13 175 lb 12.8 oz (79.742 kg)    BMI:  Body mass index is 25.27 kg/(m^2).  LOW Care Level   Kerman Passey MS, [place], LDN [phone] Pager

## 2015-05-17 NOTE — Progress Notes (Signed)
Pt's potassium increased to 6.4, Dr. Marcille Blanco aware. Continue to assess. No c/o pain, standby assist to the bathroom. Safety sitter at bedside.

## 2015-05-17 NOTE — Consult Note (Signed)
[facility] Face-to-Face Psychiatry Consult   Reason for Consult:  54 year old man with a history of schizophrenia who was brought to the hospital under commitment petition because of his refusal of dialysis Referring Physician:  Posey Pronto Patient Identification: Todd Brown MRN:  [id number] Principal Diagnosis: Schizophrenia Diagnosis:   Patient Active Problem List   Diagnosis Date Noted  . Hyperkalemia [E87.5] 05/16/2015  . Hepatitis C [B19.20] 09/26/2013  . Schizophrenia ([place]) [F20.9] 03/23/2013  . Essential hypertension, benign [I10] 03/23/2013  . Chronic kidney disease [N18.9] 03/23/2013    Total Time spent with patient: 1 hour  Subjective:   Todd Brown is a 54 y.o. male patient admitted with "I just wouldn't get my dialysis but now I know I'm going to get sick".  HPI:  Information from the patient and the chart. Patient interviewed. Chart reviewed including old chart. Reviewed his labs. I spoke with the hospitalist on duty and also spoke briefly with a nurse from the [facility] act team. 54 year old man has a history of schizophrenia. Fairly recently he started having to get hemodialysis. Apparently he had refused to go to his dialysis treatments for some time probably as much as a week or so. He tells me that he just felt like he didn't want to go because he doesn't like them so much. He tells me now however that he has come to realize that he will get sick and couldn't die if he skips them. He says that multiple people have been telling him all week that he needed to go to his dialysis. Last night his nephrologist filed commitment papers to have him brought to the hospital to receive dialysis and patient says he was cooperative with it because he knows he needs treatment. Patient is not an excellent historian. At first he denied any problems with depression later he told me that he had been very depressed. The more I probed at this the harder it became to clarify whether his mood had been any  different than usual. He tells me that he generally sleeps okay. He has been feeling sick or run down but it's hard to tell whether that might not just be the effects of missing dialysis. Patient denies having any suicidal thoughts at all. No wish to die. No thoughts of hurting anyone else. He denies having any auditory hallucinations and does not make any obviously psychotic symptoms statements. He tells me that he has been taking his medication regularly as prescribed outside the hospital. He does not drink or use drugs. Doesn't describe any new stress other than his chronic medical problems.  Social history: Patient lives in a independent apartment with frequent visits from his act team. He tells me that he has siblings who stay in close contact with him come to visit him regularly.  Medical history: Patient has end-stage renal disease and now is on hemodialysis. He tells me that it's only been going on for about 6 months or so. He has a history of hypertension and hepatitis C as well.  Substance abuse history: Patient says he does not drink alcohol and doesn't abuse any drugs and doesn't have a history of doing so  Past Psychiatric History: Patient tells me that he's had mental health problems for a long time. He can't even remember how long. He has had hospitalizations in the past at psychiatric facilities but his memories of that are vague. He says it's been a long time ago. We don't have any records of psychiatric hospitalization in our not  sure how far back it goes. he is a regular client of the psi act team. diagnosis is schizophrenia. he denies ever trying to kill himself or any violent behavior  Risk to Self: Is patient at risk for suicide?: No Risk to Others:   Prior Inpatient Therapy:   Prior Outpatient Therapy:    Past Medical History:  Past Medical History  Diagnosis Date  . ESRD (end stage renal disease) ([place])   . Malignant hypertension   . Schizophrenia ([place])   . Anemia   .  Renal artery stenosis ([place])   . Diabetes mellitus without complication ([place])   . Hyperlipidemia   . Dialysis patient [facility])     Mon. -Wed.- Fri    Past Surgical History  Procedure Laterality Date  . Repair fx left lower leg      yrs ago (age 73)  . Peripheral vascular catheterization N/A 10/22/2014    Procedure: Dialysis/Perma Catheter Insertion;  Surgeon: Algernon Huxley, MD;  Location: [facility];  Service: Cardiovascular;  Laterality: N/A;  . Insertion of dialysis catheter Right   . Av fistula placement Left 12/26/2014    Procedure: ARTERIOVENOUS (AV) FISTULA CREATION;  Surgeon: Algernon Huxley, MD;  Location: [facility] OR[facility];  Service: Vascular;  Laterality: Left;  . Peripheral vascular catheterization N/A 02/28/2015    Procedure: Dialysis/Perma Catheter Removal;  Surgeon: Algernon Huxley, MD;  Location: [facility];  Service: Cardiovascular;  Laterality: N/A;   Family History:  Family History  Problem Relation Age of Onset  . Diabetes Neg Hx    Family Psychiatric  History: Patient says he does not know of any family history of mental health problems Social History:  History  Alcohol Use No     History  Drug Use No    Social History   Social History  . Marital Status: Single    Spouse Name: N/A  . Number of Children: N/A  . Years of Education: N/A   Social History Main Topics  . Smoking status: Current Every Day Smoker -- 0.50 packs/day    Types: Cigarettes  . Smokeless tobacco: Never Used     Comment: 10 cigars a day  . Alcohol Use: No  . Drug Use: No  . Sexual Activity: Not Asked   Other Topics Concern  . None   Social History Narrative   Additional Social History:                          Allergies:   Allergies  Allergen Reactions  . Thorazine [Chlorpromazine] Other (See Comments)    Reaction:  Unknown , pt states it makes him feel real bad    Labs:  Results for orders placed or performed during the hospital encounter of 05/16/15 (from the  past 48 hour(s))  Comprehensive metabolic panel     Status: Abnormal   Collection Time: 05/16/15  8:08 PM  Result Value Ref Range   Sodium 137 135 - 145 mmol/L   Potassium 6.4 (H) 3.5 - 5.1 mmol/L   Chloride 101 101 - 111 mmol/L   CO2 19 (L) 22 - 32 mmol/L   Glucose, Bld 76 65 - 99 mg/dL   BUN 127 (H) 6 - 20 mg/dL    Comment: CONFIRMED BY DILUTION   Creatinine, Ser 12.64 (H) 0.61 - 1.24 mg/dL   Calcium 9.1 8.9 - 10.3 mg/dL   Total Protein 6.1 (L) 6.5 - 8.1 g/dL   Albumin  3.3 (L) 3.5 - 5.0 g/dL   AST 17 15 - 41 U/L   ALT 17 17 - 63 U/L   Alkaline Phosphatase 107 38 - 126 U/L   Total Bilirubin 0.6 0.3 - 1.2 mg/dL   GFR calc non Af Amer 4 (L) >60 mL/min   GFR calc Af Amer 5 (L) >60 mL/min    Comment: (NOTE) The eGFR has been calculated using the CKD EPI equation. This calculation has not been validated in all clinical situations. eGFR's persistently <60 mL/min signify possible Chronic Kidney Disease.    Anion gap 17 (H) 5 - 15  Ethanol     Status: None   Collection Time: 05/16/15  8:08 PM  Result Value Ref Range   Alcohol, Ethyl (B) <5 <5 mg/dL    Comment:        LOWEST DETECTABLE LIMIT FOR SERUM ALCOHOL IS 5 mg/dL FOR MEDICAL PURPOSES ONLY   CBC with Diff     Status: Abnormal   Collection Time: 05/16/15  8:08 PM  Result Value Ref Range   WBC 5.6 3.8 - 10.6 K/uL   RBC 2.94 (L) 4.40 - 5.90 MIL/uL   Hemoglobin 10.2 (L) 13.0 - 18.0 g/dL   HCT 30.9 (L) 40.0 - 52.0 %   MCV 105.0 (H) 80.0 - 100.0 fL   MCH 34.7 (H) 26.0 - 34.0 pg   MCHC 33.1 32.0 - 36.0 g/dL   RDW 15.5 (H) 11.5 - 14.5 %   Platelets 287 150 - 440 K/uL   Neutrophils Relative % 63 %   Neutro Abs 3.5 1.4 - 6.5 K/uL   Lymphocytes Relative 20 %   Lymphs Abs 1.1 1.0 - 3.6 K/uL   Monocytes Relative 14 %   Monocytes Absolute 0.8 0.2 - 1.0 K/uL   Eosinophils Relative 2 %   Eosinophils Absolute 0.1 0 - 0.7 K/uL   Basophils Relative 1 %   Basophils Absolute 0.0 0 - 0.1 K/uL  Glucose, capillary     Status: None    Collection Time: 05/16/15 10:27 PM  Result Value Ref Range   Glucose-Capillary 71 65 - 99 mg/dL  Glucose, capillary     Status: Abnormal   Collection Time: 05/16/15 11:33 PM  Result Value Ref Range   Glucose-Capillary 100 (H) 65 - 99 mg/dL  Urine Drug Screen, Qualitative ([facility] only)     Status: None   Collection Time: 05/17/15 12:12 AM  Result Value Ref Range   Tricyclic, Ur Screen NONE DETECTED NONE DETECTED   Amphetamines, Ur Screen NONE DETECTED NONE DETECTED   MDMA (Ecstasy)Ur Screen NONE DETECTED NONE DETECTED   Cocaine Metabolite,Ur [place] NONE DETECTED NONE DETECTED   Opiate, Ur Screen NONE DETECTED NONE DETECTED   Phencyclidine (PCP) Ur S NONE DETECTED NONE DETECTED   Cannabinoid 50 Ng, Ur [place] NONE DETECTED NONE DETECTED   Barbiturates, Ur Screen NONE DETECTED NONE DETECTED   Benzodiazepine, Ur Scrn NONE DETECTED NONE DETECTED   Methadone Scn, Ur NONE DETECTED NONE DETECTED    Comment: (NOTE) 779  Tricyclics, urine               Cutoff 1000 ng/mL 200  Amphetamines, urine             Cutoff 1000 ng/mL 300  MDMA (Ecstasy), urine           Cutoff 500 ng/mL 400  Cocaine Metabolite, urine       Cutoff 300 ng/mL 500  Opiate, urine  Cutoff 300 ng/mL 600  Phencyclidine (PCP), urine      Cutoff 25 ng/mL 700  Cannabinoid, urine              Cutoff 50 ng/mL 800  Barbiturates, urine             Cutoff 200 ng/mL 900  Benzodiazepine, urine           Cutoff 200 ng/mL 1000 Methadone, urine                Cutoff 300 ng/mL 1100 1200 The urine drug screen provides only a preliminary, unconfirmed 1300 analytical test result and should not be used for non-medical 1400 purposes. Clinical consideration and professional judgment should 1500 be applied to any positive drug screen result due to possible 1600 interfering substances. A more specific alternate chemical method 1700 must be used in order to obtain a confirmed analytical result.  1800 Gas chromato graphy / mass  spectrometry (GC/MS) is the preferred 1900 confirmatory method.   Urinalysis complete, with microscopic ([facility] only)     Status: Abnormal   Collection Time: 05/17/15 12:12 AM  Result Value Ref Range   Color, Urine YELLOW (A) YELLOW   APPearance HAZY (A) CLEAR   Glucose, UA NEGATIVE NEGATIVE mg/dL   Bilirubin Urine NEGATIVE NEGATIVE   Ketones, ur NEGATIVE NEGATIVE mg/dL   Specific Gravity, Urine 1.009 1.005 - 1.030   Hgb urine dipstick 1+ (A) NEGATIVE   pH 7.0 5.0 - 8.0   Protein, ur 30 (A) NEGATIVE mg/dL   Nitrite NEGATIVE NEGATIVE   Leukocytes, UA 3+ (A) NEGATIVE   RBC / HPF 0-5 0 - 5 RBC/hpf   WBC, UA TOO NUMEROUS TO COUNT 0 - 5 WBC/hpf   Bacteria, UA MANY (A) NONE SEEN   Squamous Epithelial / LPF 0-5 (A) NONE SEEN  Glucose, capillary     Status: Abnormal   Collection Time: 05/17/15 12:49 AM  Result Value Ref Range   Glucose-Capillary 49 (L) 65 - 99 mg/dL  CBC     Status: Abnormal   Collection Time: 05/17/15 12:53 AM  Result Value Ref Range   WBC 6.2 3.8 - 10.6 K/uL   RBC 3.12 (L) 4.40 - 5.90 MIL/uL   Hemoglobin 10.8 (L) 13.0 - 18.0 g/dL   HCT 32.8 (L) 40.0 - 52.0 %   MCV 105.2 (H) 80.0 - 100.0 fL   MCH 34.7 (H) 26.0 - 34.0 pg   MCHC 33.0 32.0 - 36.0 g/dL   RDW 15.6 (H) 11.5 - 14.5 %   Platelets 302 150 - 440 K/uL  Basic metabolic panel     Status: Abnormal   Collection Time: 05/17/15 12:53 AM  Result Value Ref Range   Sodium 139 135 - 145 mmol/L   Potassium 5.4 (H) 3.5 - 5.1 mmol/L   Chloride 101 101 - 111 mmol/L   CO2 22 22 - 32 mmol/L   Glucose, Bld 46 (L) 65 - 99 mg/dL   BUN 128 (H) 6 - 20 mg/dL    Comment: CONFIRMED BY MANUAL DILUTION   Creatinine, Ser 13.24 (H) 0.61 - 1.24 mg/dL   Calcium 9.3 8.9 - 10.3 mg/dL   GFR calc non Af Amer 4 (L) >60 mL/min   GFR calc Af Amer 4 (L) >60 mL/min    Comment: (NOTE) The eGFR has been calculated using the CKD EPI equation. This calculation has not been validated in all clinical situations. eGFR's persistently <60 mL/min  signify possible Chronic Kidney Disease.  Anion gap 16 (H) 5 - 15  Glucose, capillary     Status: Abnormal   Collection Time: 05/17/15  1:57 AM  Result Value Ref Range   Glucose-Capillary 113 (H) 65 - 99 mg/dL  Glucose, capillary     Status: Abnormal   Collection Time: 05/17/15  3:40 AM  Result Value Ref Range   Glucose-Capillary 181 (H) 65 - 99 mg/dL  Basic metabolic panel     Status: Abnormal   Collection Time: 05/17/15  4:30 AM  Result Value Ref Range   Sodium 138 135 - 145 mmol/L   Potassium 6.4 (H) 3.5 - 5.1 mmol/L   Chloride 101 101 - 111 mmol/L   CO2 20 (L) 22 - 32 mmol/L   Glucose, Bld 145 (H) 65 - 99 mg/dL   BUN 131 (H) 6 - 20 mg/dL    Comment: RESULTS CONFIRMED BY MANUAL DILUTION   Creatinine, Ser 12.79 (H) 0.61 - 1.24 mg/dL   Calcium 9.0 8.9 - 10.3 mg/dL   GFR calc non Af Amer 4 (L) >60 mL/min   GFR calc Af Amer 4 (L) >60 mL/min    Comment: (NOTE) The eGFR has been calculated using the CKD EPI equation. This calculation has not been validated in all clinical situations. eGFR's persistently <60 mL/min signify possible Chronic Kidney Disease.    Anion gap 17 (H) 5 - 15  Glucose, capillary     Status: Abnormal   Collection Time: 05/17/15  4:36 AM  Result Value Ref Range   Glucose-Capillary 146 (H) 65 - 99 mg/dL   Comment 1 Notify RN     Current Facility-Administered Medications  Medication Dose Route Frequency Provider Last Rate Last Dose  . 0.9 %  sodium chloride infusion  100 mL Intravenous PRN Munsoor Lateef, MD      . 0.9 %  sodium chloride infusion  100 mL Intravenous PRN Munsoor Lateef, MD      . acetaminophen (TYLENOL) tablet 650 mg  650 mg Oral Q6H PRN Lytle Butte, MD       Or  . acetaminophen (TYLENOL) suppository 650 mg  650 mg Rectal Q6H PRN Lytle Butte, MD      . alteplase (CATHFLO ACTIVASE) injection 2 mg  2 mg Intracatheter Once PRN Munsoor Lateef, MD      . amLODipine (NORVASC) tablet 10 mg  10 mg Oral QHS Lytle Butte, MD      . atenolol  (TENORMIN) tablet 25 mg  25 mg Oral BID Lytle Butte, MD   Stopped at 05/17/15 [phone]  . baclofen (LIORESAL) tablet 10 mg  10 mg Oral BID PRN Lytle Butte, MD      . calcium acetate (PHOSLO) capsule 1,334 mg  1,334 mg Oral TID WC Lytle Butte, MD   1,334 mg at 05/17/15 0910  . carbamazepine (TEGRETOL) tablet 500 mg  500 mg Oral BID Lytle Butte, MD      . cholecalciferol (VITAMIN D) tablet 1,000 Units  1,000 Units Oral Daily Lytle Butte, MD   1,000 Units at 05/17/15 1507  . citalopram (CELEXA) tablet 20 mg  20 mg Oral Daily Lytle Butte, MD      . cloNIDine (CATAPRES) tablet 0.2 mg  0.2 mg Oral TID Lytle Butte, MD   Stopped at 05/17/15 [phone]  . ferrous sulfate tablet 325 mg  325 mg Oral Daily Lytle Butte, MD   325 mg at 05/17/15 1507  . fluPHENAZine (PROLIXIN) tablet  2.5 mg  2.5 mg Oral Daily Lytle Butte, MD      . folic acid (FOLVITE) tablet 1 mg  1 mg Oral Daily Lytle Butte, MD   1 mg at 05/17/15 1507  . heparin injection 1,000 Units  1,000 Units Intracatheter PRN Munsoor Lateef, MD      . heparin injection 5,000 Units  5,000 Units Subcutaneous 3 times per day Lytle Butte, MD   5,000 Units at 05/17/15 [id number]  . hydrALAZINE (APRESOLINE) injection 10 mg  10 mg Intravenous Q4H PRN Lytle Butte, MD   10 mg at 05/17/15 0340  . hydrALAZINE (APRESOLINE) tablet 100 mg  100 mg Oral 4 times per day Lytle Butte, MD   Stopped at 05/17/15 1156  . [START ON 05/18/2015] Influenza vac split quadrivalent PF (FLUARIX) injection 0.5 mL  0.5 mL Intramuscular Tomorrow-1000 Lytle Butte, MD      . insulin aspart (novoLOG) injection 0-9 Units  0-9 Units Subcutaneous TID WC Fritzi Mandes, MD      . lidocaine (PF) (XYLOCAINE) 1 % injection 5 mL  5 mL Intradermal PRN Munsoor Lateef, MD      . lidocaine-prilocaine (EMLA) cream 1 application  1 application Topical PRN Munsoor Lateef, MD      . minoxidil (LONITEN) tablet 5 mg  5 mg Oral Daily Lytle Butte, MD      . morphine 2 MG/ML injection 2 mg  2 mg  Intravenous Q4H PRN Lytle Butte, MD      . ondansetron [facility]) tablet 4 mg  4 mg Oral Q6H PRN Lytle Butte, MD       Or  . ondansetron [facility]) injection 4 mg  4 mg Intravenous Q6H PRN Lytle Butte, MD      . pantoprazole (PROTONIX) EC tablet 40 mg  40 mg Oral Daily Lytle Butte, MD   40 mg at 05/17/15 1506  . pentafluoroprop-tetrafluoroeth (GEBAUERS) aerosol 1 application  1 application Topical PRN Munsoor Lateef, MD      . Derrill Memo ON 05/18/2015] pneumococcal 23 valent vaccine (PNU-IMMUNE) injection 0.5 mL  0.5 mL Intramuscular Tomorrow-1000 Lytle Butte, MD      . sodium chloride 0.9 % injection 3 mL  3 mL Intravenous Q12H Lytle Butte, MD   3 mL at 05/17/15 1156  . traMADol (ULTRAM) tablet 50 mg  50 mg Oral Q6H PRN Lytle Butte, MD      . trihexyphenidyl (ARTANE) tablet 5 mg  5 mg Oral TID WC Lytle Butte, MD   5 mg at 05/17/15 0910    Musculoskeletal: Strength & Muscle Tone: within normal limits Gait & Station: unsteady Patient leans: N/A  Psychiatric Specialty Exam: Review of Systems  Constitutional: Positive for malaise/fatigue.  HENT: Negative.   Eyes: Negative.   Respiratory: Negative.   Cardiovascular: Positive for leg swelling.  Gastrointestinal: Positive for abdominal pain.  Musculoskeletal: Negative.   Skin: Negative.   Neurological: Negative.   Psychiatric/Behavioral: Positive for depression and memory loss. Negative for suicidal ideas, hallucinations and substance abuse. The patient is not nervous/anxious and does not have insomnia.     Blood pressure 142/86, pulse 70, temperature 98 F (36.7 C), temperature source Oral, resp. rate 11, height 6' 3"  (1.905 m), weight 91.7 kg (202 lb 2.6 oz), SpO2 95 %.Body mass index is 25.27 kg/(m^2).  General Appearance: Casual  Eye Contact::  Minimal  Speech:  Slow  Volume:  Decreased  Mood:  Dysphoric  Affect:  Flat  Thought Process:  Circumstantial  Orientation:  Full (Time, Place, and Person)  Thought Content:   Paranoid Ideation  Suicidal Thoughts:  No  Homicidal Thoughts:  No  Memory:  Immediate;   Fair Recent;   Poor Remote;   Poor  Judgement:  Impaired  Insight:  Shallow  Psychomotor Activity:  Decreased  Concentration:  Poor  Recall:  Poor  Fund of Knowledge:Poor  Language: Fair  Akathisia:  No  Handed:  Right  AIMS (if indicated):     Assets:  Desire for Improvement Financial Resources/Insurance Housing Social Support  ADL's:  Intact  Cognition: Impaired,  Mild  Sleep:      Treatment Plan Summary: Medication management and Plan Patient will be monitored in the hospital. He is Artie on his Tegretol 500 mg twice a day, Prolixin 2.5 mg a day and Artane. These are his usual outpatient medicines. I suspect he is pretty much at his baseline mentally and don't see a reason to make an immediate change to these. Any worsening of his cognition right now is probably attributable to his being several days out from dialysis. I don't think that he needs commitment papers. He is not voicing suicidal ideation. He is cooperative with treatment. I think he can be taken off the sitter and taken off of papers. I will be out of the hospital over the weekend and next week but there will be a psychiatrist available for consult and further assessment is needed please call on them.  Disposition: No evidence of imminent risk to self or others at present.   Patient does not meet criteria for psychiatric inpatient admission. Supportive therapy provided about ongoing stressors.  Vincente Asbridge 05/17/2015 3:25 PM

## 2015-05-17 NOTE — Progress Notes (Signed)
[name] form [employer] at bedside speaking with patient.

## 2015-05-17 NOTE — Progress Notes (Signed)
   05/17/15 0930  Clinical Encounter Type  Visited With Patient  Visit Type Initial  Referral From Nurse  Consult/Referral To Chaplain  Spiritual Encounters  Spiritual Needs Emotional;Prayer  Stress Factors  Patient Stress Factors Exhausted;Health changes;Major life changes  Met w/patient who expressed resistance to dialysis treatments. He stated that his refusal of treatment resulted in his current hospitalization. Discussed the physical need for support and strategies to incorporate treatment into his new rhythm of life. Pt. expressed renewed commitment to treatment. Provided pastoral care for spiritual concerns and prayer.  Chap. Teshia Mahone [facility]

## 2015-05-17 NOTE — H&P (Signed)
[facility] at [place] NAME: Todd Brown    MR#:  [id number]  DATE OF BIRTH:  February 08, 1961   DATE OF ADMISSION:  05/16/2015  PRIMARY CARE PHYSICIAN: No PCP Per Patient   REQUESTING/REFERRING PHYSICIAN: McShane  CHIEF COMPLAINT:   Chief Complaint  Patient presents with  . [facility] under involuntary commitment because he refused to get dialysis  HISTORY OF PRESENT ILLNESS:  Todd Brown  is a 54 y.o. male with a known history of end-stage renal disease on hemodialysis presenting for refusal of dialysis. He is a markedly poor historian who has difficulty providing reliable information. He states he simply has not been getting dialysis as he feels that he does not need it he denies any suicidal or homicidal ideations. He does still urinate however he states "not that much my kidneys are bad" on asking further symptoms he does describe having lower extremity edema which is been worsening ever since he stopped receiving dialysis as well as dyspnea on exertion. he is found to be hyperkalemic the emergency department  PAST MEDICAL HISTORY:   Past Medical History  Diagnosis Date  . ESRD (end stage renal disease) ([place])   . Malignant hypertension   . Schizophrenia ([place])   . Anemia   . Renal artery stenosis ([place])   . Diabetes mellitus without complication ([place])   . Hyperlipidemia   . Dialysis patient [facility])     Mon. -Wed.- Fri    PAST SURGICAL HISTORY:   Past Surgical History  Procedure Laterality Date  . Repair fx left lower leg      yrs ago (age 32)  . Peripheral vascular catheterization N/A 10/22/2014    Procedure: Dialysis/Perma Catheter Insertion;  Surgeon: Algernon Huxley, MD;  Location: [facility];  Service: Cardiovascular;  Laterality: N/A;  . Insertion of dialysis catheter Right   . Av fistula placement Left 12/26/2014    Procedure: ARTERIOVENOUS (AV) FISTULA CREATION;  Surgeon:  Algernon Huxley, MD;  Location: [facility];  Service: Vascular;  Laterality: Left;  . Peripheral vascular catheterization N/A 02/28/2015    Procedure: Dialysis/Perma Catheter Removal;  Surgeon: Algernon Huxley, MD;  Location: [facility];  Service: Cardiovascular;  Laterality: N/A;    SOCIAL HISTORY:   Social History  Substance Use Topics  . Smoking status: Current Every Day Smoker -- 0.50 packs/day    Types: Cigarettes  . Smokeless tobacco: Never Used     Comment: 10 cigars a day  . Alcohol Use: No    FAMILY HISTORY:   Family History  Problem Relation Age of Onset  . Diabetes Neg Hx     DRUG ALLERGIES:   Allergies  Allergen Reactions  . Thorazine [Chlorpromazine] Other (See Comments)    Reaction:  Unknown     REVIEW OF SYSTEMS:  REVIEW OF SYSTEMS:  CONSTITUTIONAL: Denies fevers, chills, fatigue, weakness.  EYES: Denies blurred vision, double vision, or eye pain.  EARS, NOSE, THROAT: Denies tinnitus, ear pain, hearing loss.  RESPIRATORY: denies cough, shortness of breath, wheezing  CARDIOVASCULAR: Denies chest pain, palpitations, edema.  GASTROINTESTINAL: Denies nausea, vomiting, diarrhea, abdominal pain.  GENITOURINARY: Denies dysuria, hematuria.  ENDOCRINE: Denies nocturia or thyroid problems. HEMATOLOGIC AND LYMPHATIC: Denies easy bruising or bleeding.  SKIN: Denies rash or lesions.  MUSCULOSKELETAL: Denies pain in neck, back, shoulder, knees, hips, or further arthritic symptoms.  NEUROLOGIC: Denies paralysis, paresthesias.  PSYCHIATRIC: Denies anxiety or depressive symptoms.  Otherwise full review of systems performed by me is negative.   MEDICATIONS AT HOME:   Prior to Admission medications   Medication Sig Start Date End Date Taking? Authorizing Provider  amLODipine (NORVASC) 10 MG tablet Take 1 tablet (10 mg total) by mouth at bedtime. 10/31/14   Gladstone Lighter, MD  atenolol (TENORMIN) 25 MG tablet Take 1 tablet (25 mg total) by mouth 2 (two) times daily.  10/31/14   Gladstone Lighter, MD  baclofen (LIORESAL) 10 MG tablet Take 10 mg by mouth 2 (two) times daily as needed for muscle spasms.    Historical Provider, MD  calcium acetate (PHOSLO) 667 MG capsule Take 2 capsules (1,334 mg total) by mouth 3 (three) times daily with meals. Patient not taking: Reported on 02/28/2015 10/31/14   Gladstone Lighter, MD  carbamazepine (TEGRETOL) 200 MG tablet Take 2.5 tablets (500 mg total) by mouth 2 (two) times daily. Taken 2 1/2 tabs (500mg  ) twice a day 10/31/14   Gladstone Lighter, MD  cholecalciferol (VITAMIN D) 1000 UNITS tablet Take 1,000 Units by mouth daily.    Historical Provider, MD  citalopram (CELEXA) 20 MG tablet Take 1 tablet (20 mg total) by mouth daily. 10/31/14   Gladstone Lighter, MD  cloNIDine (CATAPRES) 0.2 MG tablet Take 1 tablet (0.2 mg total) by mouth 3 (three) times daily. 10/31/14   Gladstone Lighter, MD  Fe Fum-FA-B Cmp-C-Zn-Mg-Mn-Cu (FERROCITE PLUS PO) Take by mouth daily.    Historical Provider, MD  fluPHENAZine (PROLIXIN) 2.5 MG tablet Take 2.5 mg by mouth daily.    Historical Provider, MD  fluPHENAZine decanoate (PROLIXIN) 25 MG/ML injection Inject 37.5 mg into the muscle every 14 (fourteen) days.    Historical Provider, MD  hydrALAZINE (APRESOLINE) 100 MG tablet Take 1 tablet (100 mg total) by mouth every 6 (six) hours. 10/31/14   Gladstone Lighter, MD  HYDROcodone-acetaminophen (NORCO) 5-325 MG per tablet Take 1 tablet by mouth every 6 (six) hours as needed for moderate pain. Patient not taking: Reported on 02/28/2015 12/26/14   Algernon Huxley, MD  irbesartan (AVAPRO) 300 MG tablet Take 1 tablet (300 mg total) by mouth daily. Patient not taking: Reported on 02/28/2015 10/31/14   Gladstone Lighter, MD  minoxidil (LONITEN) 2.5 MG tablet Take 2 tablets (5 mg total) by mouth daily. 10/31/14   Gladstone Lighter, MD  omeprazole (PRILOSEC) 20 MG capsule Take 20 mg by mouth daily.    Historical Provider, MD  oxyCODONE-acetaminophen (ROXICET) 5-325 MG per tablet  Take 1 tablet by mouth every 6 (six) hours as needed. Patient not taking: Reported on 02/28/2015 01/21/15   Harvest Dark, MD  traMADol (ULTRAM) 50 MG tablet Take 50 mg by mouth every 6 (six) hours as needed for pain.    Historical Provider, MD  trihexyphenidyl (ARTANE) 5 MG tablet Take 5 mg by mouth 3 (three) times daily with meals.    Historical Provider, MD      VITAL SIGNS:  Blood pressure 153/100, pulse 57, temperature 98.1 F (36.7 C), temperature source Oral, resp. rate 11, height 6\' 3"  (1.905 m), weight 175 lb (79.379 kg), SpO2 94 %.  PHYSICAL EXAMINATION:  VITAL SIGNS: Filed Vitals:   05/16/15 2300 05/16/15 2330  BP: 182/98 153/100  Pulse: 59 57  Temp:    Resp: 13 11   GENERAL:54 y.o.male currently in no acute distress.  HEAD: Normocephalic, atraumatic.  EYES: Pupils equal, round, reactive to light. Extraocular muscles intact. No scleral icterus.  MOUTH: Moist mucosal membrane. Dentition intact. No abscess noted.  EAR, NOSE, THROAT: Clear without exudates. No external lesions.  NECK: Supple. No thyromegaly. No nodules. No JVD.  PULMONARY: Clear to ascultation, without wheeze rails or rhonci. No use of accessory muscles, Good respiratory effort. good air entry bilaterally CHEST: Nontender to palpation.  CARDIOVASCULAR: S1 and S2. Regular rate and rhythm. No murmurs, rubs, or gallops. 2+ edema. Pedal pulses 2+ bilaterally. Palpable thrill left AV fistula GASTROINTESTINAL: Soft, nontender, nondistended. No masses. Positive bowel sounds. No hepatosplenomegaly.  MUSCULOSKELETAL: No swelling, clubbing, or edema. Range of motion full in all extremities.  NEUROLOGIC: Cranial nerves II through XII are intact. No gross focal neurological deficits. Sensation intact. Reflexes intact.  SKIN: No ulceration, lesions, rashes, or cyanosis. Skin warm and dry. Turgor intact.  PSYCHIATRIC: Mood, affect flattened The patient is awake, alert and oriented x 3. Insight, judgment appear to be  poor.    LABORATORY PANEL:   CBC  Recent Labs Lab 05/16/15 2008  WBC 5.6  HGB 10.2*  HCT 30.9*  PLT 287   ------------------------------------------------------------------------------------------------------------------  Chemistries   Recent Labs Lab 05/16/15 2008  NA 137  K 6.4*  CL 101  CO2 19*  GLUCOSE 76  BUN 127*  CREATININE 12.64*  CALCIUM 9.1  AST 17  ALT 17  ALKPHOS 107  BILITOT 0.6   ------------------------------------------------------------------------------------------------------------------  Cardiac Enzymes No results for input(s): TROPONINI in the last 168 hours. ------------------------------------------------------------------------------------------------------------------  RADIOLOGY:  Dg Chest 2 View  05/16/2015  CLINICAL DATA:  End-stage renal disease, shortness of breath, missed dialysis EXAM: CHEST  2 VIEW COMPARISON:  10/21/2014, 10/20/2014 FINDINGS: Cardiomegaly evident. Increased vascular and diffuse interstitial opacities compatible with mild edema pattern compared to 10/21/2014. No definite focal pneumonia, collapse or consolidation. No effusion or pneumothorax. Trachea midline. No acute osseous finding. IMPRESSION: Cardiomegaly with vascular and diffuse interstitial prominence compatible with mild edema pattern/volume overload. No definite focal pneumonia or effusion. Electronically Signed   By: Jerilynn Mages.  Shick M.D.   On: 05/16/2015 21:50    EKG:   Orders placed or performed during the hospital encounter of 05/16/15  . ED EKG  . ED EKG  . EKG 12-Lead  . EKG 12-Lead    IMPRESSION AND PLAN:   54 year old African-American gentleman history of end-stage renal disease on dialysis presenting with a freeze of her dialysis Ex[place] 1. Hyperkalemia has been treated appropriately in the emergency department will continue to follow potassium trends he may require additional doses of medications if hyperkalemia proves to be refractory will need  dialysis urgently 2. End-stage renal disease on hemodialysis: He does have some symptoms of volume overload mainly lower extremity edema once again we'll consult nephrology for continuation of dialysis/restarting of dialysis 3. Essential hypertension continue with atenolol, clonidine, hydralazine as well as other home medications with the addition of as needed hydralazine 4. Venous thromboembolism prophylactic: Heparin subcutaneous    All the records are reviewed and case discussed with ED provider. Management plans discussed with the patient, family and they are in agreement.  CODE STATUS: Full  TOTAL TIME TAKING CARE OF THIS PATIENT: 35 minutes.    Martez Weiand,  Karenann Cai.D on 05/17/2015 at 12:01 AM  Between 7am to 6pm - Pager - [phone]  After 6pm: House Pager: - [phone]  [facility]  Office  [phone]  CC: Primary care physician; No PCP Per Patient

## 2015-05-17 NOTE — ED Notes (Signed)
After checking pt's glucose, pt was given crackers and ginger ale. Dr. Lavetta Nielsen was informed of blood glucose.

## 2015-05-18 LAB — CBC
HEMATOCRIT: 33.3 % — AB (ref 40.0–52.0)
HEMOGLOBIN: 11.2 g/dL — AB (ref 13.0–18.0)
MCH: 35.2 pg — AB (ref 26.0–34.0)
MCHC: 33.5 g/dL (ref 32.0–36.0)
MCV: 104.9 fL — AB (ref 80.0–100.0)
Platelets: 271 10*3/uL (ref 150–440)
RBC: 3.17 MIL/uL — ABNORMAL LOW (ref 4.40–5.90)
RDW: 15.5 % — ABNORMAL HIGH (ref 11.5–14.5)
WBC: 5.7 10*3/uL (ref 3.8–10.6)

## 2015-05-18 LAB — BASIC METABOLIC PANEL
ANION GAP: 14 (ref 5–15)
BUN: 93 mg/dL — ABNORMAL HIGH (ref 6–20)
CHLORIDE: 103 mmol/L (ref 101–111)
CO2: 23 mmol/L (ref 22–32)
Calcium: 8.9 mg/dL (ref 8.9–10.3)
Creatinine, Ser: 11.13 mg/dL — ABNORMAL HIGH (ref 0.61–1.24)
GFR calc Af Amer: 5 mL/min — ABNORMAL LOW (ref >60)
GFR calc non Af Amer: 5 mL/min — ABNORMAL LOW (ref >60)
GLUCOSE: 135 mg/dL — AB (ref 65–99)
POTASSIUM: 4.8 mmol/L (ref 3.5–5.1)
Sodium: 140 mmol/L (ref 135–145)

## 2015-05-18 LAB — GLUCOSE, CAPILLARY
GLUCOSE-CAPILLARY: 89 mg/dL (ref 65–99)
GLUCOSE-CAPILLARY: 232 mg/dL — AB (ref 65–99)
Glucose-Capillary: 108 mg/dL — ABNORMAL HIGH (ref 65–99)
Glucose-Capillary: 197 mg/dL — ABNORMAL HIGH (ref 65–99)

## 2015-05-18 LAB — HEPATITIS B SURFACE ANTIGEN: Hepatitis B Surface Ag: NEGATIVE

## 2015-05-18 LAB — PARATHYROID HORMONE, INTACT (NO CA): PTH: 318 pg/mL — ABNORMAL HIGH (ref 15–65)

## 2015-05-18 MED ORDER — ALBUTEROL SULFATE (2.5 MG/3ML) 0.083% IN NEBU
3.0000 mL | INHALATION_SOLUTION | Freq: Four times a day (QID) | RESPIRATORY_TRACT | Status: DC | PRN
  Administered 2015-05-18 – 2015-05-20 (×5): 3 mL via RESPIRATORY_TRACT
  Filled 2015-05-18 (×5): qty 3

## 2015-05-18 NOTE — Progress Notes (Signed)
Patient okay to be transferred to med-surg unit per MD. Patient is being transferred from 236 to 221. Report was given to [facility]. Patient has been in bed resting with no  Complaints of pain or SOB. Patient was transferred via orderly, Hoyle Sauer. Belongings at bedside sent with patient. Patient has no pressure ulcers or skins tears.

## 2015-05-18 NOTE — Progress Notes (Signed)
[place] at [place] NAME: Todd Brown    MR#:  [id number]  DATE OF BIRTH:  December 03, 1960  SUBJECTIVE:  Wants inhaler. No other complaints. Had HD y'day  REVIEW OF SYSTEMS:   Review of Systems  Constitutional: Negative for fever, chills and weight loss.  HENT: Negative for ear discharge, ear pain and nosebleeds.   Eyes: Negative for blurred vision, pain and discharge.  Respiratory: Negative for sputum production, shortness of breath, wheezing and stridor.   Cardiovascular: Negative for chest pain, palpitations, orthopnea and PND.  Gastrointestinal: Negative for nausea, vomiting, abdominal pain and diarrhea.  Genitourinary: Negative for urgency and frequency.  Musculoskeletal: Negative for back pain and joint pain.  Neurological: Negative for sensory change, speech change, focal weakness and weakness.  Psychiatric/Behavioral: Negative for depression and hallucinations. The patient is not nervous/anxious.   All other systems reviewed and are negative.  Tolerating Diet:yes Tolerating PT: not needed  DRUG ALLERGIES:   Allergies  Allergen Reactions  . Thorazine [Chlorpromazine] Other (See Comments)    Reaction:  Unknown , pt states it makes him feel real bad    VITALS:  Blood pressure 167/73, pulse 63, temperature 97.9 F (36.6 C), temperature source Oral, resp. rate 22, height 6\' 3"  (1.905 m), weight 202 lb 2.6 oz (91.7 kg), SpO2 100 %.  PHYSICAL EXAMINATION:   Physical Exam  GENERAL:  54 y.o.-year-old patient lying in the bed with no acute distress.  EYES: Pupils equal, round, reactive to light and accommodation. No scleral icterus. Extraocular muscles intact.  HEENT: Head atraumatic, normocephalic. Oropharynx and nasopharynx clear.  NECK:  Supple, no jugular venous distention. No thyroid enlargement, no tenderness.  LUNGS: Normal breath sounds bilaterally, no wheezing, rales, rhonchi. No use of accessory muscles of  respiration.  CARDIOVASCULAR: S1, S2 normal. No murmurs, rubs, or gallops.  ABDOMEN: Soft, nontender, nondistended. Bowel sounds present. No organomegaly or mass.  EXTREMITIES: No cyanosis, clubbing or edema b/l.    NEUROLOGIC: Cranial nerves II through XII are intact. No focal Motor or sensory deficits b/l.   PSYCHIATRIC: The patient is alert and oriented x 3.  SKIN: No obvious rash, lesion, or ulcer.    LABORATORY PANEL:   CBC  Recent Labs Lab 05/17/15 0053  WBC 6.2  HGB 10.8*  HCT 32.8*  PLT 302    Chemistries   Recent Labs Lab 05/16/15 2008  05/17/15 0430  NA 137  < > 138  K 6.4*  < > 6.4*  CL 101  < > 101  CO2 19*  < > 20*  GLUCOSE 76  < > 145*  BUN 127*  < > 131*  CREATININE 12.64*  < > 12.79*  CALCIUM 9.1  < > 9.0  AST 17  --   --   ALT 17  --   --   ALKPHOS 107  --   --   BILITOT 0.6  --   --   < > = values in this interval not displayed.  Cardiac Enzymes No results for input(s): TROPONINI in the last 168 hours.  RADIOLOGY:  Dg Chest 2 View  05/16/2015  CLINICAL DATA:  End-stage renal disease, shortness of breath, missed dialysis EXAM: CHEST  2 VIEW COMPARISON:  10/21/2014, 10/20/2014 FINDINGS: Cardiomegaly evident. Increased vascular and diffuse interstitial opacities compatible with mild edema pattern compared to 10/21/2014. No definite focal pneumonia, collapse or consolidation. No effusion or pneumothorax. Trachea midline. No acute osseous finding. IMPRESSION: Cardiomegaly with vascular and  diffuse interstitial prominence compatible with mild edema pattern/volume overload. No definite focal pneumonia or effusion. Electronically Signed   By: Jerilynn Mages.  Shick M.D.   On: 05/16/2015 21:50     ASSESSMENT AND PLAN:    54 year old African-American gentleman history of end-stage renal disease on dialysis presenting with refusal to continue HD  1. Hyperkalemia has been treated appropriately in the emergency department -resumed HD -post HD no labs done -will  check BMP today  2. End-stage renal disease on hemodialysis: He does have some symptoms of volume overload mainly lower extremity edema  -resumed HD  3. Essential hypertension  continue with atenolol, clonidine, hydralazine   4. Venous thromboembolism prophylactic: Heparin subcutaneous  5.h/o schizophrenia Appreciated Psych eval.  Pt's IVC papers d/ced. No need for sitter per eval   Case discussed with Care Management/Social Worker. Management plans discussed with the patient, family and they are in agreement.  CODE STATUS: full  DVT Prophylaxis: heparin TOTAL TIME TAKING CARE OF THIS PATIENT: 6minutes.  >50% time spent on counselling and coordination of care with dr Holley Raring  POSSIBLE D/C IN 1DAYS, DEPENDING ON CLINICAL CONDITION.   Georgi Tuel M.D on 05/18/2015 at 8:26 AM  Between 7am to 6pm - Pager - [phone]  After 6pm go to [email or web address] - password [email or web address]  [facility] [facility]  Office  [phone]  CC: Primary care physician; No PCP Per Patient

## 2015-05-18 NOTE — Progress Notes (Signed)
[facility]  ROUNDING NOTE   Subjective:   Seen and examined on hemodialysis. Emergent hemodialysis yesterday due to hyperkalemia.  Missed over one week of hemodialysis.  Tolerating treatment well. BFR 300.   Objective:  Vital signs in last 24 hours:  Temp:  [97.9 F (36.6 C)-98.5 F (36.9 C)] 98 F (36.7 C) (12/17 1105) Pulse Rate:  [63-72] 68 (12/17 1105) Resp:  [11-22] 22 (12/17 0532) BP: (118-167)/(72-88) 147/78 mmHg (12/17 1100) SpO2:  [95 %-100 %] 100 % (12/17 0532) Weight:  [88.8 kg (195 lb 12.3 oz)-91.7 kg (202 lb 2.6 oz)] 88.8 kg (195 lb 12.3 oz) (12/17 1105)  Weight change: 12.321 kg (27 lb 2.6 oz) Filed Weights   05/17/15 0428 05/17/15 1400 05/18/15 1105  Weight: 90.901 kg (200 lb 6.4 oz) 91.7 kg (202 lb 2.6 oz) 88.8 kg (195 lb 12.3 oz)    Intake/Output: I/O last 3 completed shifts: In: 360 [P.O.:360] Out: 1700 [Urine:200; Other:1500]   Intake/Output this shift:  Total I/O In: 120 [P.O.:120] Out: -   Physical Exam: General: NAD  Head: Normocephalic, atraumatic. Moist oral mucosal membranes  Eyes: Anicteric, PERRL  Neck: Supple, trachea midline  Lungs:  Clear to auscultation normal effort  Heart: Regular rate and rhythm,   Abdomen:  Soft, nontender, BS present  Extremities: trace peripheral edema.  Neurologic: Nonfocal, moving all four extremities  Skin: No lesions  Access: Left upper arm fistula, good bruit and thrill.    Basic Metabolic Panel:  Recent Labs Lab 05/16/15 2008 05/17/15 0053 05/17/15 0430 05/17/15 1446  NA 137 139 138  --   K 6.4* 5.4* 6.4*  --   CL 101 101 101  --   CO2 19* 22 20*  --   GLUCOSE 76 46* 145*  --   BUN 127* 128* 131*  --   CREATININE 12.64* 13.24* 12.79*  --   CALCIUM 9.1 9.3 9.0  --   PHOS  --   --   --  7.9*    Liver Function Tests:  Recent Labs Lab 05/16/15 2008  AST 17  ALT 17  ALKPHOS 107  BILITOT 0.6  PROT 6.1*  ALBUMIN 3.3*   No results for input(s): LIPASE, AMYLASE in the  last 168 hours. No results for input(s): AMMONIA in the last 168 hours.  CBC:  Recent Labs Lab 05/16/15 2008 05/17/15 0053 05/18/15 0843  WBC 5.6 6.2 5.7  NEUTROABS 3.5  --   --   HGB 10.2* 10.8* 11.2*  HCT 30.9* 32.8* 33.3*  MCV 105.0* 105.2* 104.9*  PLT 287 302 271    Cardiac Enzymes: No results for input(s): CKTOTAL, CKMB, CKMBINDEX, TROPONINI in the last 168 hours.  BNP: Invalid input(s): POCBNP  CBG:  Recent Labs Lab 05/17/15 0340 05/17/15 0436 05/17/15 1736 05/17/15 2117 05/18/15 0743  GLUCAP 181* 146* 89 99 89    Microbiology: No results found for this or any previous visit.  Coagulation Studies: No results for input(s): LABPROT, INR in the last 72 hours.  Urinalysis:  Recent Labs  05/17/15 0012  COLORURINE YELLOW*  LABSPEC 1.009  PHURINE 7.0  GLUCOSEU NEGATIVE  HGBUR 1+*  BILIRUBINUR NEGATIVE  KETONESUR NEGATIVE  PROTEINUR 30*  NITRITE NEGATIVE  LEUKOCYTESUR 3+*      Imaging: Dg Chest 2 View  05/16/2015  CLINICAL DATA:  End-stage renal disease, shortness of breath, missed dialysis EXAM: CHEST  2 VIEW COMPARISON:  10/21/2014, 10/20/2014 FINDINGS: Cardiomegaly evident. Increased vascular and diffuse interstitial opacities compatible with mild edema  pattern compared to 10/21/2014. No definite focal pneumonia, collapse or consolidation. No effusion or pneumothorax. Trachea midline. No acute osseous finding. IMPRESSION: Cardiomegaly with vascular and diffuse interstitial prominence compatible with mild edema pattern/volume overload. No definite focal pneumonia or effusion. Electronically Signed   By: Jerilynn Mages.  Shick M.D.   On: 05/16/2015 21:50     Medications:     . amLODipine  10 mg Oral QHS  . atenolol  25 mg Oral BID  . calcium acetate  1,334 mg Oral TID WC  . carbamazepine  500 mg Oral BID  . cholecalciferol  1,000 Units Oral Daily  . citalopram  20 mg Oral Daily  . cloNIDine  0.2 mg Oral TID  . ferrous sulfate  325 mg Oral Daily  .  fluPHENAZine  2.5 mg Oral Daily  . folic acid  1 mg Oral Daily  . heparin  5,000 Units Subcutaneous 3 times per day  . hydrALAZINE  100 mg Oral 4 times per day  . Influenza vac split quadrivalent PF  0.5 mL Intramuscular Tomorrow-1000  . insulin aspart  0-9 Units Subcutaneous TID WC  . minoxidil  5 mg Oral Daily  . pantoprazole  40 mg Oral Daily  . pneumococcal 23 valent vaccine  0.5 mL Intramuscular Tomorrow-1000  . sodium chloride  3 mL Intravenous Q12H  . trihexyphenidyl  5 mg Oral TID WC   sodium chloride, sodium chloride, acetaminophen **OR** acetaminophen, albuterol, alteplase, baclofen, heparin, hydrALAZINE, lidocaine (PF), lidocaine-prilocaine, ondansetron **OR** ondansetron (ZOFRAN) IV, pentafluoroprop-tetrafluoroeth, traMADol  Assessment/ Plan:  54 y.o. male with schizophrenia, hypertension, hepatitis C, tobacco abuse, started dialysis 09/2014, ESRD, anemia of CKD, SHPTH.  [place]  1.  End-stage renal disease: with hyperkalemia. on hemodialysis Monday, Wednesday, Friday.  The patient has missed at least a week of hemodialysis.   - reinitiating hemodialysis. Second treatment. Tolerating well. Third treatment for Monday.  Potassium pending for today.   2.  Secondary hyperparathyroidism.  PTH 318 - at goal. Phosphorus elevated at 7.9. Calcium at goal.  - calcium acetate with meals.   3.  Hypertension.  Blood pressure better controlled. - Continue amlodipine, atenolol, clonidine, hydralazine, and minoxidil.  4.  Anemia of chronic kidney disease.  Hemoglobin currently 11.2  Hold off on adding back Epogen for now.    LOS: [place], [place] 12/17/201611:23 AM

## 2015-05-19 LAB — GLUCOSE, CAPILLARY
GLUCOSE-CAPILLARY: 93 mg/dL (ref 65–99)
GLUCOSE-CAPILLARY: 99 mg/dL (ref 65–99)
Glucose-Capillary: 49 mg/dL — ABNORMAL LOW (ref 65–99)
Glucose-Capillary: 63 mg/dL — ABNORMAL LOW (ref 65–99)
Glucose-Capillary: 89 mg/dL (ref 65–99)

## 2015-05-19 LAB — CBC
HEMATOCRIT: 31.8 % — AB (ref 40.0–52.0)
HEMOGLOBIN: 10.7 g/dL — AB (ref 13.0–18.0)
MCH: 35.2 pg — ABNORMAL HIGH (ref 26.0–34.0)
MCHC: 33.7 g/dL (ref 32.0–36.0)
MCV: 104.6 fL — ABNORMAL HIGH (ref 80.0–100.0)
Platelets: 211 10*3/uL (ref 150–440)
RBC: 3.04 MIL/uL — AB (ref 4.40–5.90)
RDW: 15.3 % — ABNORMAL HIGH (ref 11.5–14.5)
WBC: 7 10*3/uL (ref 3.8–10.6)

## 2015-05-19 LAB — RENAL FUNCTION PANEL
Albumin: 2.8 g/dL — ABNORMAL LOW (ref 3.5–5.0)
Anion gap: 10 (ref 5–15)
BUN: 76 mg/dL — ABNORMAL HIGH (ref 6–20)
CHLORIDE: 103 mmol/L (ref 101–111)
CO2: 26 mmol/L (ref 22–32)
Calcium: 8.3 mg/dL — ABNORMAL LOW (ref 8.9–10.3)
Creatinine, Ser: 9.39 mg/dL — ABNORMAL HIGH (ref 0.61–1.24)
GFR calc Af Amer: 6 mL/min — ABNORMAL LOW (ref >60)
GFR calc non Af Amer: 6 mL/min — ABNORMAL LOW (ref >60)
Glucose, Bld: 113 mg/dL — ABNORMAL HIGH (ref 65–99)
PHOSPHORUS: 6.2 mg/dL — AB (ref 2.5–4.6)
Potassium: 5.1 mmol/L (ref 3.5–5.1)
SODIUM: 139 mmol/L (ref 135–145)

## 2015-05-19 MED ORDER — BACLOFEN 10 MG PO TABS: 10.0000 mg | ORAL_TABLET | Freq: Two times a day (BID) | ORAL | Status: DC | PRN

## 2015-05-19 MED ORDER — CALCIUM ACETATE (PHOS BINDER) 667 MG PO CAPS: 1334.0000 mg | ORAL_CAPSULE | Freq: Three times a day (TID) | ORAL | Status: DC

## 2015-05-19 MED ORDER — ATENOLOL 25 MG PO TABS: 25.0000 mg | ORAL_TABLET | Freq: Two times a day (BID) | ORAL | Status: DC

## 2015-05-19 MED ORDER — TRAMADOL HCL 50 MG PO TABS: 50.0000 mg | ORAL_TABLET | Freq: Four times a day (QID) | ORAL | Status: DC | PRN

## 2015-05-19 MED ORDER — MINOXIDIL 2.5 MG PO TABS: 5.0000 mg | ORAL_TABLET | Freq: Every day | ORAL | Status: DC

## 2015-05-19 MED ORDER — FERROUS SULFATE 325 (65 FE) MG PO TABS: 325.0000 mg | ORAL_TABLET | Freq: Every day | ORAL | Status: DC

## 2015-05-19 MED ORDER — CLONIDINE HCL 0.2 MG PO TABS: 0.2000 mg | ORAL_TABLET | Freq: Three times a day (TID) | ORAL | Status: DC

## 2015-05-19 MED ORDER — HYDRALAZINE HCL 100 MG PO TABS: 100.0000 mg | ORAL_TABLET | Freq: Four times a day (QID) | ORAL | Status: DC

## 2015-05-19 MED ORDER — CITALOPRAM HYDROBROMIDE 20 MG PO TABS: 20.0000 mg | ORAL_TABLET | Freq: Every day | ORAL | Status: DC

## 2015-05-19 MED ORDER — AMLODIPINE BESYLATE 10 MG PO TABS: 10.0000 mg | ORAL_TABLET | Freq: Every day | ORAL | Status: DC

## 2015-05-19 NOTE — Discharge Instructions (Signed)
Resume your HD MWF as before 

## 2015-05-19 NOTE — Progress Notes (Signed)
[facility]  ROUNDING NOTE   Subjective:   Second hemodialysis treatment yesterday after reinitiation. UF 1.5 litres Third treatment for later today.   Objective:  Vital signs in last 24 hours:  Temp:  [98 F (36.7 C)-98.5 F (36.9 C)] 98.1 F (36.7 C) (12/18 0553) Pulse Rate:  [61-72] 70 (12/18 0725) Resp:  [17-18] 18 (12/18 0725) BP: (118-149)/(65-82) 149/82 mmHg (12/18 0725) SpO2:  [94 %-100 %] 100 % (12/18 0725) Weight:  [88.8 kg (195 lb 12.3 oz)] 88.8 kg (195 lb 12.3 oz) (12/17 1105)  Weight change: -2.9 kg (-6 lb 6.3 oz) Filed Weights   05/17/15 0428 05/17/15 1400 05/18/15 1105  Weight: 90.901 kg (200 lb 6.4 oz) 91.7 kg (202 lb 2.6 oz) 88.8 kg (195 lb 12.3 oz)    Intake/Output: I/O last 3 completed shifts: In: [id number] [P.O.:840; I.V.:3] Out: 2300 [Urine:800; Other:1500]   Intake/Output this shift:  Total I/O In: 240 [P.O.:240] Out: -   Physical Exam: General: NAD  Head: Normocephalic, atraumatic. Moist oral mucosal membranes  Eyes: Anicteric, PERRL  Neck: Supple, trachea midline  Lungs:  Clear to auscultation normal effort  Heart: Regular rate and rhythm,   Abdomen:  Soft, nontender, BS present  Extremities: No peripheral edema.  Neurologic: Nonfocal, moving all four extremities  Skin: No lesions  Access: Left upper arm AVF, good bruit and thrill.    Basic Metabolic Panel:  Recent Labs Lab 05/16/15 2008 05/17/15 0053 05/17/15 0430 05/17/15 1446 05/18/15 0843  NA 137 139 138  --  140  K 6.4* 5.4* 6.4*  --  4.8  CL 101 101 101  --  103  CO2 19* 22 20*  --  23  GLUCOSE 76 46* 145*  --  135*  BUN 127* 128* 131*  --  93*  CREATININE 12.64* 13.24* 12.79*  --  11.13*  CALCIUM 9.1 9.3 9.0  --  8.9  PHOS  --   --   --  7.9*  --     Liver Function Tests:  Recent Labs Lab 05/16/15 2008  AST 17  ALT 17  ALKPHOS 107  BILITOT 0.6  PROT 6.1*  ALBUMIN 3.3*   No results for input(s): LIPASE, AMYLASE in the last 168 hours. No results  for input(s): AMMONIA in the last 168 hours.  CBC:  Recent Labs Lab 05/16/15 2008 05/17/15 0053 05/18/15 0843  WBC 5.6 6.2 5.7  NEUTROABS 3.5  --   --   HGB 10.2* 10.8* 11.2*  HCT 30.9* 32.8* 33.3*  MCV 105.0* 105.2* 104.9*  PLT 287 302 271    Cardiac Enzymes: No results for input(s): CKTOTAL, CKMB, CKMBINDEX, TROPONINI in the last 168 hours.  BNP: Invalid input(s): POCBNP  CBG:  Recent Labs Lab 05/18/15 1420 05/18/15 1659 05/18/15 2141 05/19/15 0725 05/19/15 0757  GLUCAP 108* 197* 232* 49* 70*    Microbiology: No results found for this or any previous visit.  Coagulation Studies: No results for input(s): LABPROT, INR in the last 72 hours.  Urinalysis:  Recent Labs  05/17/15 0012  COLORURINE YELLOW*  LABSPEC 1.009  PHURINE 7.0  GLUCOSEU NEGATIVE  HGBUR 1+*  BILIRUBINUR NEGATIVE  KETONESUR NEGATIVE  PROTEINUR 30*  NITRITE NEGATIVE  LEUKOCYTESUR 3+*      Imaging: No results found.   Medications:     . amLODipine  10 mg Oral QHS  . atenolol  25 mg Oral BID  . calcium acetate  1,334 mg Oral TID WC  . carbamazepine  500 mg  Oral BID  . cholecalciferol  1,000 Units Oral Daily  . citalopram  20 mg Oral Daily  . cloNIDine  0.2 mg Oral TID  . ferrous sulfate  325 mg Oral Daily  . fluPHENAZine  2.5 mg Oral Daily  . folic acid  1 mg Oral Daily  . heparin  5,000 Units Subcutaneous 3 times per day  . hydrALAZINE  100 mg Oral 4 times per day  . insulin aspart  0-9 Units Subcutaneous TID WC  . minoxidil  5 mg Oral Daily  . pantoprazole  40 mg Oral Daily  . sodium chloride  3 mL Intravenous Q12H  . trihexyphenidyl  5 mg Oral TID WC   sodium chloride, sodium chloride, acetaminophen **OR** acetaminophen, albuterol, alteplase, baclofen, heparin, hydrALAZINE, lidocaine (PF), lidocaine-prilocaine, ondansetron **OR** ondansetron (ZOFRAN) IV, pentafluoroprop-tetrafluoroeth, traMADol  Assessment/ Plan:  54 y.o. male with schizophrenia, hypertension,  hepatitis C, tobacco abuse, started dialysis 09/2014, ESRD, anemia of CKD, SHPTH.  [place]  1.  End-stage renal disease: with hyperkalemia. on hemodialysis Monday, Wednesday, Friday.  The patient has missed at least a week of hemodialysis.   - reinitiating hemodialysis. Tolerating treatments well Third treatment for later today. Orders prepared.  - will need to confirm his outpatient chair before discharge.   2.  Secondary hyperparathyroidism.  PTH 318 - at goal. Phosphorus elevated at 7.9. Calcium at goal.  - calcium acetate with meals.   3.  Hypertension.  Blood pressure at goal.  - Continue amlodipine, atenolol, clonidine, hydralazine, and minoxidil.  4.  Anemia of chronic kidney disease.  Hemoglobin currently 11.2  Hold off on adding back Epogen for now.    LOS: 3 Todd Brown 12/18/201610:13 AM

## 2015-05-19 NOTE — Discharge Summary (Deleted)
[place] at [place] NAME: Todd Brown    MR#:  [id number]  DATE OF BIRTH:  [birth year]  DATE OF ADMISSION:  05/16/2015 ADMITTING PHYSICIAN: Lytle Butte, MD  DATE OF DISCHARGE: 05/19/15  PRIMARY CARE PHYSICIAN: No PCP Per Patient    ADMISSION DIAGNOSIS:  Hyperkalemia [E87.5] Schizophrenia, unspecified type ([place]) [F20.9]  DISCHARGE DIAGNOSIS:  Severe hyperkalemia due to noncompliance-resolved ESRD on HD-MWF  SECONDARY DIAGNOSIS:   Past Medical History  Diagnosis Date  . ESRD (end stage renal disease) ([place])   . Malignant hypertension   . Schizophrenia ([place])   . Anemia   . Renal artery stenosis ([place])   . Diabetes mellitus without complication ([place])   . Hyperlipidemia   . Dialysis patient [facility])     Mon. -Wed.Ludwig Clarks    HOSPITAL COURSE:   54 year old African-American gentleman history of end-stage renal disease on dialysis presenting with refusal to continue HD  1. Hyperkalemia has been treated appropriately in the emergency department -resumed HD -post HD no labs done -k down to 4.8  2. End-stage renal disease on hemodialysis - He does have some symptoms of volume overload mainly lower extremity edema  -resumed HD -hemodynamically stable  3. Essential hypertension  continue with atenolol, clonidine, hydralazine   4. Venous thromboembolism prophylactic: Heparin subcutaneous  5.h/o schizophrenia Stable. Resumed meds  CONSULTS OBTAINED:  Treatment Team:  Lytle Butte, MD Anthonette Legato, MD Gonzella Lex, MD  DRUG ALLERGIES:   Allergies  Allergen Reactions  . Thorazine [Chlorpromazine] Other (See Comments)    Reaction:  Unknown , pt states it makes him feel real bad    DISCHARGE MEDICATIONS:   Current Discharge Medication List    START taking these medications   Details  ferrous sulfate 325 (65 FE) MG tablet Take 1 tablet (325 mg total) by mouth daily. Qty: 30 tablet, Refills: 3       CONTINUE these medications which have CHANGED   Details  amLODipine (NORVASC) 10 MG tablet Take 1 tablet (10 mg total) by mouth at bedtime. Qty: 30 tablet, Refills: 1    atenolol (TENORMIN) 25 MG tablet Take 1 tablet (25 mg total) by mouth 2 (two) times daily. Qty: 60 tablet, Refills: 1    baclofen (LIORESAL) 10 MG tablet Take 1 tablet (10 mg total) by mouth 2 (two) times daily as needed for muscle spasms. Qty: 30 each, Refills: 0    calcium acetate (PHOSLO) 667 MG capsule Take 2 capsules (1,334 mg total) by mouth 3 (three) times daily with meals. Qty: 60 capsule, Refills: 1    citalopram (CELEXA) 20 MG tablet Take 1 tablet (20 mg total) by mouth daily. Qty: 30 tablet, Refills: 1    cloNIDine (CATAPRES) 0.2 MG tablet Take 1 tablet (0.2 mg total) by mouth 3 (three) times daily. Qty: 90 tablet, Refills: 1    hydrALAZINE (APRESOLINE) 100 MG tablet Take 1 tablet (100 mg total) by mouth every 6 (six) hours. Qty: 120 tablet, Refills: 1    minoxidil (LONITEN) 2.5 MG tablet Take 2 tablets (5 mg total) by mouth daily. Qty: 60 tablet, Refills: 1    traMADol (ULTRAM) 50 MG tablet Take 1 tablet (50 mg total) by mouth every 6 (six) hours as needed. Qty: 30 tablet, Refills: 0      CONTINUE these medications which have NOT CHANGED   Details  carbamazepine (TEGRETOL) 200 MG tablet Take 2.5 tablets (500 mg total) by mouth 2 (two) times  daily. Taken 2 1/2 tabs (500mg  ) twice a day Qty: 150 tablet, Refills: 0    cholecalciferol (VITAMIN D) 1000 UNITS tablet Take 1,000 Units by mouth daily.    fluPHENAZine (PROLIXIN) 2.5 MG tablet Take 2.5 mg by mouth daily.    trihexyphenidyl (ARTANE) 5 MG tablet Take 5 mg by mouth 3 (three) times daily with meals.      STOP taking these medications     omeprazole (PRILOSEC) 20 MG capsule         If you experience worsening of your admission symptoms, develop shortness of breath, life threatening emergency, suicidal or homicidal thoughts you must seek  medical attention immediately by calling 911 or calling your MD immediately  if symptoms less severe.  You Must read complete instructions/literature along with all the possible adverse reactions/side effects for all the Medicines you take and that have been prescribed to you. Take any new Medicines after you have completely understood and accept all the possible adverse reactions/side effects.   Please note  You were cared for by a hospitalist during your hospital stay. If you have any questions about your discharge medications or the care you received while you were in the hospital after you are discharged, you can call the unit and asked to speak with the hospitalist on call if the hospitalist that took care of you is not available. Once you are discharged, your primary care physician will handle any further medical issues. Please note that NO REFILLS for any discharge medications will be authorized once you are discharged, as it is imperative that you return to your primary care physician (or establish a relationship with a primary care physician if you do not have one) for your aftercare needs so that they can reassess your need for medications and monitor your lab values. Today   SUBJECTIVE  Doing well  VITAL SIGNS:  Blood pressure 149/82, pulse 70, temperature 98.1 F (36.7 C), temperature source Oral, resp. rate 18, height 6\' 3"  (1.905 m), weight 88.8 kg (195 lb 12.3 oz), SpO2 100 %.  I/O:   Intake/Output Summary (Last 24 hours) at 05/19/15 0927 Last data filed at 05/19/15 0553  Gross per 24 hour  Intake    603 ml  Output   2100 ml  Net  -1497 ml    PHYSICAL EXAMINATION:  GENERAL:  53 y.o.-year-old patient lying in the bed with no acute distress.  EYES: Pupils equal, round, reactive to light and accommodation. No scleral icterus. Extraocular muscles intact.  HEENT: Head atraumatic, normocephalic. Oropharynx and nasopharynx clear.  NECK:  Supple, no jugular venous distention. No  thyroid enlargement, no tenderness.  LUNGS: Normal breath sounds bilaterally, no wheezing, rales,rhonchi or crepitation. No use of accessory muscles of respiration.  CARDIOVASCULAR: S1, S2 normal. No murmurs, rubs, or gallops.  ABDOMEN: Soft, non-tender, non-distended. Bowel sounds present. No organomegaly or mass.  EXTREMITIES: No pedal edema, cyanosis, or clubbing.  NEUROLOGIC: Cranial nerves II through XII are intact. Muscle strength 5/5 in all extremities. Sensation intact. Gait not checked.  PSYCHIATRIC: The patient is alert and oriented x 3.  SKIN: No obvious rash, lesion, or ulcer.   DATA REVIEW:   CBC   Recent Labs Lab 05/18/15 0843  WBC 5.7  HGB 11.2*  HCT 33.3*  PLT 271    Chemistries   Recent Labs Lab 05/16/15 2008  05/18/15 0843  NA 137  < > 140  K 6.4*  < > 4.8  CL 101  < > 103  CO2 19*  < > 23  GLUCOSE 76  < > 135*  BUN 127*  < > 93*  CREATININE 12.64*  < > 11.13*  CALCIUM 9.1  < > 8.9  AST 17  --   --   ALT 17  --   --   ALKPHOS 107  --   --   BILITOT 0.6  --   --   < > = values in this interval not displayed.  Management plans discussed with the patient, family and they are in agreement.  CODE STATUS:     Code Status Orders        Start     Ordered   05/16/15 2343  Full code   Continuous     05/16/15 2343      TOTAL TIME TAKING CARE OF THIS PATIENT: 40 minutes.    Tuan Tippin M.D on 05/19/2015 at 9:27 AM  Between 7am to 6pm - Pager - [phone] After 6pm go to [email or web address] - password [email or web address] [facility]  [facility]  Office  [phone]  CC: Primary care physician; No PCP Per Patient

## 2015-05-20 DIAGNOSIS — E875 Hyperkalemia: Secondary | ICD-10-CM

## 2015-05-20 LAB — CBC
HCT: 30.2 % — ABNORMAL LOW (ref 40.0–52.0)
HEMOGLOBIN: 9.9 g/dL — AB (ref 13.0–18.0)
MCH: 34.3 pg — AB (ref 26.0–34.0)
MCHC: 32.9 g/dL (ref 32.0–36.0)
MCV: 104.4 fL — ABNORMAL HIGH (ref 80.0–100.0)
PLATELETS: 182 10*3/uL (ref 150–440)
RBC: 2.89 MIL/uL — AB (ref 4.40–5.90)
RDW: 14.9 % — ABNORMAL HIGH (ref 11.5–14.5)
WBC: 5.9 10*3/uL (ref 3.8–10.6)

## 2015-05-20 LAB — RENAL FUNCTION PANEL
ALBUMIN: 2.8 g/dL — AB (ref 3.5–5.0)
ANION GAP: 8 (ref 5–15)
BUN: 55 mg/dL — ABNORMAL HIGH (ref 6–20)
CALCIUM: 8.3 mg/dL — AB (ref 8.9–10.3)
CO2: 27 mmol/L (ref 22–32)
CREATININE: 8.08 mg/dL — AB (ref 0.61–1.24)
Chloride: 102 mmol/L (ref 101–111)
GFR calc non Af Amer: 7 mL/min — ABNORMAL LOW (ref >60)
GFR, EST AFRICAN AMERICAN: 8 mL/min — AB (ref >60)
GLUCOSE: 101 mg/dL — AB (ref 65–99)
PHOSPHORUS: 5.4 mg/dL — AB (ref 2.5–4.6)
Potassium: 4.8 mmol/L (ref 3.5–5.1)
SODIUM: 137 mmol/L (ref 135–145)

## 2015-05-20 LAB — GLUCOSE, CAPILLARY
GLUCOSE-CAPILLARY: 127 mg/dL — AB (ref 65–99)
Glucose-Capillary: 64 mg/dL — ABNORMAL LOW (ref 65–99)
Glucose-Capillary: 82 mg/dL (ref 65–99)

## 2015-05-20 NOTE — Discharge Summary (Signed)
[place] at [place] NAME: Todd Brown    MR#:  [id number]  DATE OF BIRTH:  09-Nov-1960  DATE OF ADMISSION:  05/16/2015 ADMITTING PHYSICIAN: Lytle Butte, MD  DATE OF DISCHARGE: 05/20/15  PRIMARY CARE PHYSICIAN: No PCP Per Patient    ADMISSION DIAGNOSIS:  Hyperkalemia [E87.5] Schizophrenia, unspecified type ([place]) [F20.9]  DISCHARGE DIAGNOSIS:  Severe hyperkalemia due to noncompliance-resolved ESRD on HD-MWF  SECONDARY DIAGNOSIS:   Past Medical History  Diagnosis Date  . ESRD (end stage renal disease) ([place])   . Malignant hypertension   . Schizophrenia ([place])   . Anemia   . Renal artery stenosis ([place])   . Diabetes mellitus without complication ([place])   . Hyperlipidemia   . Dialysis patient [facility])     Mon. -Wed.Ludwig Clarks    HOSPITAL COURSE:   54 year old African-American gentleman history of end-stage renal disease on dialysis presenting with refusal to continue HD  1. Hyperkalemia has been treated appropriately in the emergency department -resumed HD -post HD k down to 4.8  2. End-stage renal disease on hemodialysis - He does have some symptoms of volume overload mainly lower extremity edema  -resumed HD -hemodynamically stable -CSW to get HD time for pt prior to d/c  3. Essential hypertension  continue with atenolol, clonidine, hydralazine   4. Venous thromboembolism prophylactic: Heparin subcutaneous  5.h/o schizophrenia Stable. Resumed meds  D/C later today after HD  CONSULTS OBTAINED:  Treatment Team:  Lytle Butte, MD Anthonette Legato, MD Gonzella Lex, MD  DRUG ALLERGIES:   Allergies  Allergen Reactions  . Thorazine [Chlorpromazine] Other (See Comments)    Reaction:  Unknown , pt states it makes him feel real bad    DISCHARGE MEDICATIONS:   Current Discharge Medication List    START taking these medications   Details  ferrous sulfate 325 (65 FE) MG tablet Take 1 tablet (325 mg total)  by mouth daily. Qty: 30 tablet, Refills: 3      CONTINUE these medications which have CHANGED   Details  amLODipine (NORVASC) 10 MG tablet Take 1 tablet (10 mg total) by mouth at bedtime. Qty: 30 tablet, Refills: 1    atenolol (TENORMIN) 25 MG tablet Take 1 tablet (25 mg total) by mouth 2 (two) times daily. Qty: 60 tablet, Refills: 1    baclofen (LIORESAL) 10 MG tablet Take 1 tablet (10 mg total) by mouth 2 (two) times daily as needed for muscle spasms. Qty: 30 each, Refills: 0    calcium acetate (PHOSLO) 667 MG capsule Take 2 capsules (1,334 mg total) by mouth 3 (three) times daily with meals. Qty: 60 capsule, Refills: 1    citalopram (CELEXA) 20 MG tablet Take 1 tablet (20 mg total) by mouth daily. Qty: 30 tablet, Refills: 1    cloNIDine (CATAPRES) 0.2 MG tablet Take 1 tablet (0.2 mg total) by mouth 3 (three) times daily. Qty: 90 tablet, Refills: 1    hydrALAZINE (APRESOLINE) 100 MG tablet Take 1 tablet (100 mg total) by mouth every 6 (six) hours. Qty: 120 tablet, Refills: 1    minoxidil (LONITEN) 2.5 MG tablet Take 2 tablets (5 mg total) by mouth daily. Qty: 60 tablet, Refills: 1    traMADol (ULTRAM) 50 MG tablet Take 1 tablet (50 mg total) by mouth every 6 (six) hours as needed. Qty: 30 tablet, Refills: 0      CONTINUE these medications which have NOT CHANGED   Details  carbamazepine (TEGRETOL) 200  MG tablet Take 2.5 tablets (500 mg total) by mouth 2 (two) times daily. Taken 2 1/2 tabs (500mg  ) twice a day Qty: 150 tablet, Refills: 0    cholecalciferol (VITAMIN D) 1000 UNITS tablet Take 1,000 Units by mouth daily.    fluPHENAZine (PROLIXIN) 2.5 MG tablet Take 2.5 mg by mouth daily.    trihexyphenidyl (ARTANE) 5 MG tablet Take 5 mg by mouth 3 (three) times daily with meals.      STOP taking these medications     omeprazole (PRILOSEC) 20 MG capsule         If you experience worsening of your admission symptoms, develop shortness of breath, life threatening  emergency, suicidal or homicidal thoughts you must seek medical attention immediately by calling 911 or calling your MD immediately  if symptoms less severe.  You Must read complete instructions/literature along with all the possible adverse reactions/side effects for all the Medicines you take and that have been prescribed to you. Take any new Medicines after you have completely understood and accept all the possible adverse reactions/side effects.   Please note  You were cared for by a hospitalist during your hospital stay. If you have any questions about your discharge medications or the care you received while you were in the hospital after you are discharged, you can call the unit and asked to speak with the hospitalist on call if the hospitalist that took care of you is not available. Once you are discharged, your primary care physician will handle any further medical issues. Please note that NO REFILLS for any discharge medications will be authorized once you are discharged, as it is imperative that you return to your primary care physician (or establish a relationship with a primary care physician if you do not have one) for your aftercare needs so that they can reassess your need for medications and monitor your lab values. Today   SUBJECTIVE  Doing well  VITAL SIGNS:  Blood pressure 124/75, pulse 70, temperature 98.3 F (36.8 C), temperature source Oral, resp. rate 18, height 6\' 3"  (1.905 m), weight 86.3 kg (190 lb 4.1 oz), SpO2 96 %.  I/O:    Intake/Output Summary (Last 24 hours) at 05/20/15 0939 Last data filed at 05/20/15 0804  Gross per 24 hour  Intake    960 ml  Output   1500 ml  Net   -540 ml    PHYSICAL EXAMINATION:  GENERAL:  54 y.o.-year-old patient lying in the bed with no acute distress.  EYES: Pupils equal, round, reactive to light and accommodation. No scleral icterus. Extraocular muscles intact.  HEENT: Head atraumatic, normocephalic. Oropharynx and nasopharynx  clear.  NECK:  Supple, no jugular venous distention. No thyroid enlargement, no tenderness.  LUNGS: Normal breath sounds bilaterally, no wheezing, rales,rhonchi or crepitation. No use of accessory muscles of respiration.  CARDIOVASCULAR: S1, S2 normal. No murmurs, rubs, or gallops.  ABDOMEN: Soft, non-tender, non-distended. Bowel sounds present. No organomegaly or mass.  EXTREMITIES: No pedal edema, cyanosis, or clubbing.  NEUROLOGIC: Cranial nerves II through XII are intact. Muscle strength 5/5 in all extremities. Sensation intact. Gait not checked.  PSYCHIATRIC: The patient is alert and oriented x 3.  SKIN: No obvious rash, lesion, or ulcer.   DATA REVIEW:   CBC   Recent Labs Lab 05/19/15 1341  WBC 7.0  HGB 10.7*  HCT 31.8*  PLT 211    Chemistries   Recent Labs Lab 05/16/15 2008  05/19/15 1341  NA 137  < >  139  K 6.4*  < > 5.1  CL 101  < > 103  CO2 19*  < > 26  GLUCOSE 76  < > 113*  BUN 127*  < > 76*  CREATININE 12.64*  < > 9.39*  CALCIUM 9.1  < > 8.3*  AST 17  --   --   ALT 17  --   --   ALKPHOS 107  --   --   BILITOT 0.6  --   --   < > = values in this interval not displayed.  Management plans discussed with the patient, family and they are in agreement.  CODE STATUS:     Code Status Orders        Start     Ordered   05/16/15 2343  Full code   Continuous     05/16/15 2343      TOTAL TIME TAKING CARE OF THIS PATIENT: 40 minutes.    Todd Brown M.D on 05/20/2015 at 9:39 AM  Between 7am to 6pm - Pager - [phone] After 6pm go to [email or web address] - password [email or web address] [facility]  [place] [facility]  Office  [phone]  CC: Primary care physician; No PCP Per Patient

## 2015-05-20 NOTE — Progress Notes (Signed)
Pt's medication reconciliation has been for some reason did not allow to resume his home meds. I personally called [facility] and got his med list that was given to pharmacist for redoing it. Once done will redo his discharge orders and call pt to resume taking all his home meds RN informed.

## 2015-05-20 NOTE — Clinical Social Work Note (Signed)
MD states patient to discharge back home today after dialysis at the hospital. CSW has contacted Caren Griffins, patient's Care Coordinator and informed her of this. For transportation needs to return home, Caren Griffins stated that his ACT team should be able to transport patient home and to call them at: [phone]. Shela Leff MSW,LCSW [phone]

## 2015-05-20 NOTE — Progress Notes (Signed)
Pt d/c to home today. D/C paperwork reviewed and education provided with all questions and concerns addressed.  Pt transported home via ACT transportation.  Volunteer services contact for transportation from room to exit.

## 2015-05-20 NOTE — Progress Notes (Signed)
[facility]  ROUNDING NOTE   Subjective:   Patient seen during dialysis Tolerating well     Objective:  Vital signs in last 24 hours:  Temp:  [97.9 F (36.6 C)-98.4 F (36.9 C)] 98.3 F (36.8 C) (12/19 0456) Pulse Rate:  [66-71] 70 (12/19 0456) Resp:  [17-18] 18 (12/19 0456) BP: (112-124)/(55-75) 124/75 mmHg (12/19 0456) SpO2:  [90 %-96 %] 96 % (12/19 0723) Weight:  [86.3 kg (190 lb 4.1 oz)-87.5 kg (192 lb 14.4 oz)] 87.5 kg (192 lb 14.4 oz) (12/19 1335)  Weight change: -0.6 kg (-1 lb 5.2 oz) Filed Weights   05/19/15 1340 05/19/15 1610 05/20/15 1335  Weight: 88.2 kg (194 lb 7.1 oz) 86.3 kg (190 lb 4.1 oz) 87.5 kg (192 lb 14.4 oz)    Intake/Output: I/O last 3 completed shifts: In: [id number] [P.O.:1440; I.V.:3] Out: 2100 [Urine:600; Other:1500]   Intake/Output this shift:  Total I/O In: 600 [P.O.:600] Out: -   Physical Exam: General: NAD  Head: Normocephalic, atraumatic. Moist oral mucosal membranes  Eyes: Anicteric,  Neck: Supple, trachea midline  Lungs:  Clear to auscultation normal effort  Heart: Regular rate and rhythm,   Abdomen:  Soft, nontender, BS present  Extremities: No peripheral edema.  Neurologic: Nonfocal, moving all four extremities  Skin: No lesions  Access: Left upper arm AVF, good bruit and thrill.    Basic Metabolic Panel:  Recent Labs Lab 05/17/15 0053 05/17/15 0430 05/17/15 1446 05/18/15 0843 05/19/15 1341 05/20/15 1310  NA 139 138  --  140 139 137  K 5.4* 6.4*  --  4.8 5.1 4.8  CL 101 101  --  103 103 102  CO2 22 20*  --  23 26 27   GLUCOSE 46* 145*  --  135* 113* 101*  BUN 128* 131*  --  93* 76* 55*  CREATININE 13.24* 12.79*  --  11.13* 9.39* 8.08*  CALCIUM 9.3 9.0  --  8.9 8.3* 8.3*  PHOS  --   --  7.9*  --  6.2* 5.4*    Liver Function Tests:  Recent Labs Lab 05/16/15 2008 05/19/15 1341 05/20/15 1310  AST 17  --   --   ALT 17  --   --   ALKPHOS 107  --   --   BILITOT 0.6  --   --   PROT 6.1*  --   --    ALBUMIN 3.3* 2.8* 2.8*   No results for input(s): LIPASE, AMYLASE in the last 168 hours. No results for input(s): AMMONIA in the last 168 hours.  CBC:  Recent Labs Lab 05/16/15 2008 05/17/15 0053 05/18/15 0843 05/19/15 1341 05/20/15 1310  WBC 5.6 6.2 5.7 7.0 5.9  NEUTROABS 3.5  --   --   --   --   HGB 10.2* 10.8* 11.2* 10.7* 9.9*  HCT 30.9* 32.8* 33.3* 31.8* 30.2*  MCV 105.0* 105.2* 104.9* 104.6* 104.4*  PLT 287 302 271 211 182    Cardiac Enzymes: No results for input(s): CKTOTAL, CKMB, CKMBINDEX, TROPONINI in the last 168 hours.  BNP: Invalid input(s): POCBNP  CBG:  Recent Labs Lab 05/19/15 1632 05/19/15 2155 05/20/15 0728 05/20/15 0802 05/20/15 1124  GLUCAP 99 89 64* 82 127*    Microbiology: No results found for this or any previous visit.  Coagulation Studies: No results for input(s): LABPROT, INR in the last 72 hours.  Urinalysis: No results for input(s): COLORURINE, LABSPEC, PHURINE, GLUCOSEU, HGBUR, BILIRUBINUR, KETONESUR, PROTEINUR, UROBILINOGEN, NITRITE, LEUKOCYTESUR in the last 72 hours.  Invalid input(s): APPERANCEUR    Imaging: No results found.   Medications:     . amLODipine  10 mg Oral QHS  . atenolol  25 mg Oral BID  . calcium acetate  1,334 mg Oral TID WC  . carbamazepine  500 mg Oral BID  . cholecalciferol  1,000 Units Oral Daily  . citalopram  20 mg Oral Daily  . cloNIDine  0.2 mg Oral TID  . ferrous sulfate  325 mg Oral Daily  . fluPHENAZine  2.5 mg Oral Daily  . folic acid  1 mg Oral Daily  . heparin  5,000 Units Subcutaneous 3 times per day  . hydrALAZINE  100 mg Oral 4 times per day  . insulin aspart  0-9 Units Subcutaneous TID WC  . minoxidil  5 mg Oral Daily  . pantoprazole  40 mg Oral Daily  . sodium chloride  3 mL Intravenous Q12H  . trihexyphenidyl  5 mg Oral TID WC   sodium chloride, sodium chloride, acetaminophen **OR** acetaminophen, albuterol, alteplase, baclofen, heparin, hydrALAZINE, lidocaine (PF),  lidocaine-prilocaine, ondansetron **OR** ondansetron (ZOFRAN) IV, pentafluoroprop-tetrafluoroeth, traMADol  Assessment/ Plan:  54 y.o. male with schizophrenia, hypertension, hepatitis C, tobacco abuse, started dialysis 09/2014, ESRD, anemia of CKD, SHPTH.  [place]  1.  End-stage renal disease: with hyperkalemia. on hemodialysis Monday, Wednesday, Friday.  The patient has missed at least a week of hemodialysis stating that his mind was messing with him.   - He will resume his outpatient schedule upon discharge  2.  Secondary hyperparathyroidism.  PTH 318 - at goal. Phosphorus elevated at 5.4. Calcium at goal.  - calcium acetate with meals.   3.  Hypertension.  Blood pressure at goal.  - Continue amlodipine, atenolol, clonidine, hydralazine, and minoxidil.  4.  Anemia of chronic kidney disease.  Hemoglobin currently 9.9   Resume EPO as outpatient    LOS: 4 Isella Slatten 12/19/20162:20 PM

## 2015-05-20 NOTE — Progress Notes (Signed)
[place] [facility] [place] NAME: Todd Brown    MR#:  [id number]  DATE OF BIRTH:  1961/01/15  SUBJECTIVE:  Wants inhaler. No other complaints. Had HD y'day  REVIEW OF SYSTEMS:   Review of Systems  Constitutional: Negative for fever, chills and weight loss.  HENT: Negative for ear discharge, ear pain and nosebleeds.   Eyes: Negative for blurred vision, pain and discharge.  Respiratory: Negative for sputum production, shortness of breath, wheezing and stridor.   Cardiovascular: Negative for chest pain, palpitations, orthopnea and PND.  Gastrointestinal: Negative for nausea, vomiting, abdominal pain and diarrhea.  Genitourinary: Negative for urgency and frequency.  Musculoskeletal: Negative for back pain and joint pain.  Neurological: Negative for sensory change, speech change, focal weakness and weakness.  Psychiatric/Behavioral: Negative for depression and hallucinations. The patient is not nervous/anxious.   All other systems reviewed and are negative.  Tolerating Diet:yes Tolerating PT: not needed  DRUG ALLERGIES:   Allergies  Allergen Reactions  . Thorazine [Chlorpromazine] Other (See Comments)    Reaction:  Unknown , pt states it makes him feel real bad    VITALS:  Blood pressure 124/75, pulse 70, temperature 98.3 F (36.8 C), temperature source Oral, resp. rate 18, height 6\' 3"  (1.905 m), weight 86.3 kg (190 lb 4.1 oz), SpO2 96 %.  PHYSICAL EXAMINATION:   Physical Exam  GENERAL:  54 y.o.-year-old patient lying in the bed with no acute distress.  EYES: Pupils equal, round, reactive to light and accommodation. No scleral icterus. Extraocular muscles intact.  HEENT: Head atraumatic, normocephalic. Oropharynx and nasopharynx clear.  NECK:  Supple, no jugular venous distention. No thyroid enlargement, no tenderness.  LUNGS: Normal breath sounds bilaterally, no wheezing, rales, rhonchi. No use of accessory muscles of  respiration.  CARDIOVASCULAR: S1, S2 normal. No murmurs, rubs, or gallops.  ABDOMEN: Soft, nontender, nondistended. Bowel sounds present. No organomegaly or mass.  EXTREMITIES: No cyanosis, clubbing or edema b/l.    NEUROLOGIC: Cranial nerves II through XII are intact. No focal Motor or sensory deficits b/l.   PSYCHIATRIC: The patient is alert and oriented x 3.  SKIN: No obvious rash, lesion, or ulcer.    LABORATORY PANEL:   CBC  Recent Labs Lab 05/19/15 1341  WBC 7.0  HGB 10.7*  HCT 31.8*  PLT 211    Chemistries   Recent Labs Lab 05/16/15 2008  05/19/15 1341  NA 137  < > 139  K 6.4*  < > 5.1  CL 101  < > 103  CO2 19*  < > 26  GLUCOSE 76  < > 113*  BUN 127*  < > 76*  CREATININE 12.64*  < > 9.39*  CALCIUM 9.1  < > 8.3*  AST 17  --   --   ALT 17  --   --   ALKPHOS 107  --   --   BILITOT 0.6  --   --   < > = values in this interval not displayed.  Cardiac Enzymes No results for input(s): TROPONINI in the last 168 hours.  RADIOLOGY:  No results found.   ASSESSMENT AND PLAN:    54 year old African-American gentleman history of end-stage renal disease on dialysis presenting with refusal to continue HD  1. Hyperkalemia has been treated appropriately in the emergency department -resumed HD -post HD K ok  2. End-stage renal disease on hemodialysis: He does have some symptoms of volume overload mainly lower extremity edema  -resumed HD  3. Essential hypertension  continue with atenolol, clonidine, hydralazine   4. Venous thromboembolism prophylactic: Heparin subcutaneous  5.h/o schizophrenia Appreciated Psych eval.  Pt's IVC papers d/ced. No need for sitter per eval  Once pt get his HD time on Monday then d/c home after dialysis   Case discussed with Care Management/Social Worker. Management plans discussed with the patient, family and they are in agreement.  CODE STATUS: full  DVT Prophylaxis: heparin TOTAL TIME TAKING CARE OF THIS PATIENT:  39minutes.  >50% time spent on counselling and coordination of care with dr Juleen China POSSIBLE D/C IN 1DAYS, DEPENDING ON CLINICAL CONDITION.   Adreanna Fickel M.D Between 7am to 6pm - Pager - [phone]  After 6pm go to [email or web address] - password [email or web address] [facility]  [facility]  Office  [phone]  CC: Primary care physician; No PCP Per Patient

## 2015-05-20 NOTE — Clinical Social Work Note (Signed)
CSW contacted Lennette Bihari at [facility] ([place] stated to [place] that they do not transport patient to dialysis). Lennette Bihari stated and confirmed that  patient is on their schedule for pick up for outpatient dialysis on Wednesday.  Shela Leff MSW,LCSW [phone]

## 2016-02-12 ENCOUNTER — Encounter (INDEPENDENT_AMBULATORY_CARE_PROVIDER_SITE_OTHER): Payer: Self-pay

## 2016-02-12 DIAGNOSIS — N186 End stage renal disease: Secondary | ICD-10-CM | POA: Insufficient documentation

## 2016-03-09 ENCOUNTER — Other Ambulatory Visit (INDEPENDENT_AMBULATORY_CARE_PROVIDER_SITE_OTHER): Payer: Self-pay | Admitting: Vascular Surgery

## 2016-03-09 DIAGNOSIS — N186 End stage renal disease: Secondary | ICD-10-CM

## 2016-03-09 DIAGNOSIS — Z992 Dependence on renal dialysis: Principal | ICD-10-CM

## 2016-03-10 ENCOUNTER — Ambulatory Visit (INDEPENDENT_AMBULATORY_CARE_PROVIDER_SITE_OTHER): Payer: Self-pay | Admitting: Vascular Surgery

## 2016-03-10 ENCOUNTER — Encounter (INDEPENDENT_AMBULATORY_CARE_PROVIDER_SITE_OTHER): Payer: Medicaid Other

## 2016-04-14 ENCOUNTER — Ambulatory Visit (INDEPENDENT_AMBULATORY_CARE_PROVIDER_SITE_OTHER): Payer: Medicaid Other

## 2016-04-14 ENCOUNTER — Encounter (INDEPENDENT_AMBULATORY_CARE_PROVIDER_SITE_OTHER): Payer: Self-pay | Admitting: Vascular Surgery

## 2016-04-14 ENCOUNTER — Ambulatory Visit (INDEPENDENT_AMBULATORY_CARE_PROVIDER_SITE_OTHER): Payer: Medicaid Other | Admitting: Vascular Surgery

## 2016-04-14 VITALS — BP 118/74 | HR 68 | Resp 16 | Ht 75.0 in | Wt 177.0 lb

## 2016-04-14 DIAGNOSIS — I10 Essential (primary) hypertension: Secondary | ICD-10-CM | POA: Diagnosis not present

## 2016-04-14 DIAGNOSIS — N186 End stage renal disease: Secondary | ICD-10-CM | POA: Diagnosis not present

## 2016-04-14 DIAGNOSIS — Z992 Dependence on renal dialysis: Secondary | ICD-10-CM

## 2016-04-15 DIAGNOSIS — Z992 Dependence on renal dialysis: Secondary | ICD-10-CM | POA: Insufficient documentation

## 2016-04-15 NOTE — Progress Notes (Signed)
Subjective:    Patient ID: Todd Brown, male    DOB: [birth year], 55 y.o.   MRN: [id number] Chief Complaint  Patient presents with  . Re-evaluation    Ultrasound follow up   Patient presents for a four month hemodialysis access follow up. The patient has a a left brachiocephalic AV fistula. The patient underwent a duplex ultrasound of the AV access which was notable for a patent fistula without any significant hemodynamic stenosis. Patient reports his hemodialysis doppler flow is unchanged. The patient denies any issues with hemodialysis such as cannulation problems, increased bleeding, decrease in doppler flow or recirculation. The patient also denies any fistula skin breakdown, pain, edema, pallor or ulceration of the arm / hand.     Review of Systems  Constitutional: Negative.   HENT: Negative.   Eyes: Negative.   Respiratory: Negative.   Cardiovascular: Negative.   Gastrointestinal: Negative.   Endocrine: Negative.   Genitourinary:       ESRD  Musculoskeletal: Negative.   Skin: Negative.   Allergic/Immunologic: Negative.   Neurological: Negative.   Hematological: Negative.   Psychiatric/Behavioral: Negative.        Objective:   Physical Exam  Constitutional: He is oriented to person, place, and time. He appears well-developed and well-nourished.  HENT:  Head: Normocephalic and atraumatic.  Eyes: Conjunctivae and EOM are normal. Pupils are equal, round, and reactive to light.  Neck: Normal range of motion.  Cardiovascular: Normal rate, regular rhythm, normal heart sounds and intact distal pulses.   Pulses:      Radial pulses are 2+ on the right side, and 2+ on the left side.       Dorsalis pedis pulses are 2+ on the right side, and 2+ on the left side.       Posterior tibial pulses are 2+ on the right side, and 2+ on the left side.  Left Brachiocephalic AV Fistula - Good bruit and thrill. Aneurysmal skin intact. No skin threatening noted.   Pulmonary/Chest: Effort  normal and breath sounds normal.  Abdominal: Soft. Bowel sounds are normal.  Musculoskeletal: Normal range of motion. He exhibits no edema.  Neurological: He is alert and oriented to person, place, and time.  Skin: Skin is warm and dry.  Psychiatric: He has a normal mood and affect. His behavior is normal. Judgment and thought content normal.   BP 118/74 (BP Location: Right Arm)   Pulse 68   Resp 16   Ht 6\' 3"  (1.905 m)   Wt 177 lb (80.3 kg)   BMI 22.12 kg/m   Past Medical History:  Diagnosis Date  . Anemia   . Diabetes mellitus without complication ([place])   . Dialysis patient [facility])    Mon. -Wed.- Fri  . ESRD (end stage renal disease) ([place])   . Hyperlipidemia   . Malignant hypertension   . Renal artery stenosis ([place])   . Schizophrenia [facility])    Social History   Social History  . Marital status: Single    Spouse name: N/A  . Number of children: N/A  . Years of education: N/A   Occupational History  . Not on file.   Social History Main Topics  . Smoking status: Current Every Day Smoker    Packs/day: 0.50    Types: Cigarettes  . Smokeless tobacco: Never Used     Comment: 10 cigars a day  . Alcohol use No  . Drug use: No  . Sexual activity: Not on file   Other  Topics Concern  . Not on file   Social History Narrative  . No narrative on file   Past Surgical History:  Procedure Laterality Date  . AV FISTULA PLACEMENT Left 12/26/2014   Procedure: ARTERIOVENOUS (AV) FISTULA CREATION;  Surgeon: Algernon Huxley, MD;  Location: [facility] ORS;  Service: Vascular;  Laterality: Left;  . INSERTION OF DIALYSIS CATHETER Right   . PERIPHERAL VASCULAR CATHETERIZATION N/A 10/22/2014   Procedure: Dialysis/Perma Catheter Insertion;  Surgeon: Algernon Huxley, MD;  Location: [place] CV LAB;  Service: Cardiovascular;  Laterality: N/A;  . PERIPHERAL VASCULAR CATHETERIZATION N/A 02/28/2015   Procedure: Dialysis/Perma Catheter Removal;  Surgeon: Algernon Huxley, MD;  Location: [place] CV LAB;   Service: Cardiovascular;  Laterality: N/A;  . Repair fx left lower leg     yrs ago (age 75)   Family History  Problem Relation Age of Onset  . Diabetes Neg Hx    Allergies  Allergen Reactions  . Thorazine [Chlorpromazine] Other (See Comments)    Reaction:  Unknown , pt states it makes him feel real bad      Assessment & Plan:  Patient presents for a four month hemodialysis access follow up. The patient has a a left brachiocephalic AV fistula. The patient underwent a duplex ultrasound of the AV access which was notable for a patent fistula without any significant hemodynamic stenosis. Patient reports his hemodialysis doppler flow is unchanged. The patient denies any issues with hemodialysis such as cannulation problems, increased bleeding, decrease in doppler flow or recirculation. The patient also denies any fistula skin breakdown, pain, edema, pallor or ulceration of the arm / hand.   1. Dialysis patient ([place]) - Stable The patient is doing well and currently has adequate dialysis access.  2. End stage renal disease ([place]) - Stable The patient should continue to have duplex ultrasounds of the dialysis access every six months.  3. Essential hypertension, benign - Stable Encouraged good control as its slows the progression of atherosclerotic disease  Current Outpatient Prescriptions on File Prior to Visit  Medication Sig Dispense Refill  . citalopram (CELEXA) 20 MG tablet Take 20 mg by mouth daily.    . cloNIDine (CATAPRES) 0.2 MG tablet Take 1 tablet (0.2 mg total) by mouth 3 (three) times daily. 90 tablet 1  . ferrous sulfate 325 (65 FE) MG tablet Take 1 tablet (325 mg total) by mouth daily. 30 tablet 3  . fluPHENAZine (PROLIXIN) 2.5 MG tablet Take 25 mg by mouth daily.     . minoxidil (LONITEN) 2.5 MG tablet Take 2 tablets (5 mg total) by mouth daily. 60 tablet 1  . trihexyphenidyl (ARTANE) 5 MG tablet Take 5 mg by mouth 3 (three) times daily with meals.    Marland Kitchen amLODipine (NORVASC) 10  MG tablet Take 1 tablet (10 mg total) by mouth at bedtime. 30 tablet 1  . amLODipine (NORVASC) 10 MG tablet Take 10 mg by mouth daily.    Marland Kitchen atenolol (TENORMIN) 25 MG tablet Take 1 tablet (25 mg total) by mouth 2 (two) times daily. (Patient not taking: Reported on 04/14/2016) 60 tablet 1  . atenolol (TENORMIN) 25 MG tablet Take 25 mg by mouth daily.    Marland Kitchen b complex-vitamin c-folic acid (NEPHRO-VITE) 0.8 MG TABS tablet Take 1 tablet by mouth daily.    . baclofen (LIORESAL) 10 MG tablet Take 1 tablet (10 mg total) by mouth 2 (two) times daily as needed for muscle spasms. (Patient not taking: Reported on 04/14/2016) 30 each  0  . calcium acetate (PHOSLO) 667 MG capsule Take 2 capsules (1,334 mg total) by mouth 3 (three) times daily with meals. (Patient not taking: Reported on 04/14/2016) 60 capsule 1  . calcium acetate (PHOSLO) 667 MG capsule Take 1,334 mg by mouth 2 (two) times daily.    . carbamazepine (TEGRETOL) 200 MG tablet Take 2.5 tablets (500 mg total) by mouth 2 (two) times daily. Taken 2 1/2 tabs (500mg  ) twice a day (Patient not taking: Reported on 04/14/2016) 150 tablet 0  . cholecalciferol (VITAMIN D) 1000 UNITS tablet Take 1,000 Units by mouth daily.    . citalopram (CELEXA) 20 MG tablet Take 1 tablet (20 mg total) by mouth daily. (Patient not taking: Reported on 04/14/2016) 30 tablet 1  . hydrALAZINE (APRESOLINE) 100 MG tablet Take 1 tablet (100 mg total) by mouth every 6 (six) hours. (Patient not taking: Reported on 04/14/2016) 120 tablet 1  . hydrALAZINE (APRESOLINE) 100 MG tablet Take 100 mg by mouth 2 (two) times daily.    . traMADol (ULTRAM) 50 MG tablet Take 1 tablet (50 mg total) by mouth every 6 (six) hours as needed. (Patient not taking: Reported on 04/14/2016) 30 tablet 0   No current facility-administered medications on file prior to visit.     There are no Patient Instructions on file for this visit. No Follow-up on file.   KIMBERLY A STEGMAYER, PA-C

## 2016-05-26 ENCOUNTER — Inpatient Hospital Stay: Payer: Medicaid Other

## 2016-05-26 ENCOUNTER — Inpatient Hospital Stay
Admission: EM | Admit: 2016-05-26 | Discharge: 2016-05-31 | DRG: 871 | Disposition: A | Discharge status: Home / Self Care | Payer: Medicaid Other | Attending: Internal Medicine | Admitting: Internal Medicine

## 2016-05-26 ENCOUNTER — Encounter: Payer: Self-pay | Admitting: Emergency Medicine

## 2016-05-26 ENCOUNTER — Emergency Department: Payer: Medicaid Other

## 2016-05-26 DIAGNOSIS — J9601 Acute respiratory failure with hypoxia: Secondary | ICD-10-CM | POA: Diagnosis not present

## 2016-05-26 DIAGNOSIS — Z7951 Long term (current) use of inhaled steroids: Secondary | ICD-10-CM

## 2016-05-26 DIAGNOSIS — N2581 Secondary hyperparathyroidism of renal origin: Secondary | ICD-10-CM | POA: Diagnosis present

## 2016-05-26 DIAGNOSIS — Z888 Allergy status to other drugs, medicaments and biological substances status: Secondary | ICD-10-CM

## 2016-05-26 DIAGNOSIS — E875 Hyperkalemia: Secondary | ICD-10-CM | POA: Diagnosis present

## 2016-05-26 DIAGNOSIS — I120 Hypertensive chronic kidney disease with stage 5 chronic kidney disease or end stage renal disease: Secondary | ICD-10-CM | POA: Diagnosis present

## 2016-05-26 DIAGNOSIS — D631 Anemia in chronic kidney disease: Secondary | ICD-10-CM | POA: Diagnosis present

## 2016-05-26 DIAGNOSIS — E785 Hyperlipidemia, unspecified: Secondary | ICD-10-CM | POA: Diagnosis present

## 2016-05-26 DIAGNOSIS — F209 Schizophrenia, unspecified: Secondary | ICD-10-CM | POA: Diagnosis present

## 2016-05-26 DIAGNOSIS — A419 Sepsis, unspecified organism: Secondary | ICD-10-CM | POA: Diagnosis present

## 2016-05-26 DIAGNOSIS — N186 End stage renal disease: Secondary | ICD-10-CM | POA: Diagnosis present

## 2016-05-26 DIAGNOSIS — E1122 Type 2 diabetes mellitus with diabetic chronic kidney disease: Secondary | ICD-10-CM | POA: Diagnosis present

## 2016-05-26 DIAGNOSIS — J811 Chronic pulmonary edema: Secondary | ICD-10-CM | POA: Diagnosis present

## 2016-05-26 DIAGNOSIS — B1920 Unspecified viral hepatitis C without hepatic coma: Secondary | ICD-10-CM | POA: Diagnosis present

## 2016-05-26 DIAGNOSIS — J189 Pneumonia, unspecified organism: Secondary | ICD-10-CM | POA: Diagnosis present

## 2016-05-26 DIAGNOSIS — J45901 Unspecified asthma with (acute) exacerbation: Secondary | ICD-10-CM | POA: Diagnosis present

## 2016-05-26 DIAGNOSIS — Z992 Dependence on renal dialysis: Secondary | ICD-10-CM

## 2016-05-26 DIAGNOSIS — E872 Acidosis: Secondary | ICD-10-CM | POA: Diagnosis present

## 2016-05-26 DIAGNOSIS — E8770 Fluid overload, unspecified: Secondary | ICD-10-CM | POA: Diagnosis present

## 2016-05-26 DIAGNOSIS — R0600 Dyspnea, unspecified: Secondary | ICD-10-CM

## 2016-05-26 DIAGNOSIS — I701 Atherosclerosis of renal artery: Secondary | ICD-10-CM | POA: Diagnosis present

## 2016-05-26 DIAGNOSIS — Z79899 Other long term (current) drug therapy: Secondary | ICD-10-CM

## 2016-05-26 DIAGNOSIS — J440 Chronic obstructive pulmonary disease with acute lower respiratory infection: Secondary | ICD-10-CM | POA: Diagnosis present

## 2016-05-26 DIAGNOSIS — F17210 Nicotine dependence, cigarettes, uncomplicated: Secondary | ICD-10-CM | POA: Diagnosis present

## 2016-05-26 DIAGNOSIS — R0603 Acute respiratory distress: Secondary | ICD-10-CM

## 2016-05-26 DIAGNOSIS — J441 Chronic obstructive pulmonary disease with (acute) exacerbation: Secondary | ICD-10-CM | POA: Diagnosis present

## 2016-05-26 DIAGNOSIS — I248 Other forms of acute ischemic heart disease: Secondary | ICD-10-CM | POA: Diagnosis present

## 2016-05-26 DIAGNOSIS — J810 Acute pulmonary edema: Secondary | ICD-10-CM | POA: Diagnosis present

## 2016-05-26 DIAGNOSIS — J9600 Acute respiratory failure, unspecified whether with hypoxia or hypercapnia: Secondary | ICD-10-CM

## 2016-05-26 DIAGNOSIS — R0602 Shortness of breath: Secondary | ICD-10-CM | POA: Diagnosis present

## 2016-05-26 DIAGNOSIS — J9621 Acute and chronic respiratory failure with hypoxia: Secondary | ICD-10-CM | POA: Diagnosis present

## 2016-05-26 LAB — COMPREHENSIVE METABOLIC PANEL
ALBUMIN: 3.9 g/dL (ref 3.5–5.0)
ALT: 55 U/L (ref 17–63)
ALT: 49 U/L (ref 17–63)
ANION GAP: 16 — AB (ref 5–15)
AST: 81 U/L — AB (ref 15–41)
AST: 65 U/L — AB (ref 15–41)
Albumin: 3.5 g/dL (ref 3.5–5.0)
Alkaline Phosphatase: 124 U/L (ref 38–126)
Alkaline Phosphatase: 106 U/L (ref 38–126)
Anion gap: 13 (ref 5–15)
BILIRUBIN TOTAL: 0.5 mg/dL (ref 0.3–1.2)
BILIRUBIN TOTAL: 0.9 mg/dL (ref 0.3–1.2)
BUN: 48 mg/dL — AB (ref 6–20)
BUN: 54 mg/dL — AB (ref 6–20)
CALCIUM: 8.9 mg/dL (ref 8.9–10.3)
CHLORIDE: 94 mmol/L — AB (ref 101–111)
CO2: 24 mmol/L (ref 22–32)
CO2: 24 mmol/L (ref 22–32)
CREATININE: 7.47 mg/dL — AB (ref 0.61–1.24)
Calcium: 9.4 mg/dL (ref 8.9–10.3)
Chloride: 93 mmol/L — ABNORMAL LOW (ref 101–111)
Creatinine, Ser: 7.13 mg/dL — ABNORMAL HIGH (ref 0.61–1.24)
GFR calc Af Amer: 9 mL/min — ABNORMAL LOW (ref >60)
GFR, EST AFRICAN AMERICAN: 8 mL/min — AB (ref >60)
GFR, EST NON AFRICAN AMERICAN: 8 mL/min — AB (ref >60)
GFR, EST NON AFRICAN AMERICAN: 7 mL/min — AB (ref >60)
Glucose, Bld: 109 mg/dL — ABNORMAL HIGH (ref 65–99)
Glucose, Bld: 115 mg/dL — ABNORMAL HIGH (ref 65–99)
POTASSIUM: 5.4 mmol/L — AB (ref 3.5–5.1)
Potassium: 5.5 mmol/L — ABNORMAL HIGH (ref 3.5–5.1)
Sodium: 134 mmol/L — ABNORMAL LOW (ref 135–145)
Sodium: 130 mmol/L — ABNORMAL LOW (ref 135–145)
TOTAL PROTEIN: 7.7 g/dL (ref 6.5–8.1)
TOTAL PROTEIN: 6.9 g/dL (ref 6.5–8.1)

## 2016-05-26 LAB — PROTIME-INR
INR: 1.14
Prothrombin Time: 14.7 seconds (ref 11.4–15.2)

## 2016-05-26 LAB — CBC WITH DIFFERENTIAL/PLATELET
BASOS ABS: 0.2 10*3/uL — AB (ref 0–0.1)
BASOS ABS: 0 10*3/uL (ref 0–0.1)
BASOS PCT: 1 %
Basophils Relative: 0 %
Eosinophils Absolute: 0 10*3/uL (ref 0–0.7)
Eosinophils Absolute: 0 10*3/uL (ref 0–0.7)
Eosinophils Relative: 0 %
Eosinophils Relative: 0 %
HEMATOCRIT: 35.6 % — AB (ref 40.0–52.0)
HEMATOCRIT: 33.7 % — AB (ref 40.0–52.0)
Hemoglobin: 12.2 g/dL — ABNORMAL LOW (ref 13.0–18.0)
Hemoglobin: 11.8 g/dL — ABNORMAL LOW (ref 13.0–18.0)
LYMPHS ABS: 0.7 10*3/uL — AB (ref 1.0–3.6)
LYMPHS PCT: 6 %
Lymphocytes Relative: 2 %
Lymphs Abs: 0.2 10*3/uL — ABNORMAL LOW (ref 1.0–3.6)
MCH: 35.7 pg — ABNORMAL HIGH (ref 26.0–34.0)
MCH: 36.2 pg — AB (ref 26.0–34.0)
MCHC: 34.3 g/dL (ref 32.0–36.0)
MCHC: 34.9 g/dL (ref 32.0–36.0)
MCV: 103.9 fL — ABNORMAL HIGH (ref 80.0–100.0)
MCV: 103.7 fL — AB (ref 80.0–100.0)
MONO ABS: 0.7 10*3/uL (ref 0.2–1.0)
MONO ABS: 1 10*3/uL (ref 0.2–1.0)
MONOS PCT: 5 %
Monocytes Relative: 8 %
NEUTROS ABS: 10 10*3/uL — AB (ref 1.4–6.5)
Neutro Abs: 11.7 10*3/uL — ABNORMAL HIGH (ref 1.4–6.5)
Neutrophils Relative %: 92 %
Neutrophils Relative %: 86 %
PLATELETS: 181 10*3/uL (ref 150–440)
Platelets: 171 10*3/uL (ref 150–440)
RBC: 3.42 MIL/uL — ABNORMAL LOW (ref 4.40–5.90)
RBC: 3.25 MIL/uL — AB (ref 4.40–5.90)
RDW: 14.3 % (ref 11.5–14.5)
RDW: 14.3 % (ref 11.5–14.5)
WBC: 12.7 10*3/uL — ABNORMAL HIGH (ref 3.8–10.6)
WBC: 11.7 10*3/uL — AB (ref 3.8–10.6)

## 2016-05-26 LAB — PROCALCITONIN: PROCALCITONIN: 6.58 ng/mL

## 2016-05-26 LAB — APTT: APTT: 40 s — AB (ref 24–36)

## 2016-05-26 LAB — LACTIC ACID, PLASMA
LACTIC ACID, VENOUS: 1.1 mmol/L (ref 0.5–1.9)
Lactic Acid, Venous: 2.4 mmol/L (ref 0.5–1.9)

## 2016-05-26 LAB — MRSA PCR SCREENING: MRSA by PCR: POSITIVE — AB

## 2016-05-26 LAB — TROPONIN I
TROPONIN I: 0.07 ng/mL — AB (ref <0.03)
Troponin I: 0.09 ng/mL (ref <0.03)
Troponin I: 0.1 ng/mL

## 2016-05-26 MED ORDER — DEXTROSE 5 % IV SOLN
500.0000 mg | INTRAVENOUS | Status: DC
  Administered 2016-05-26: 500 mg via INTRAVENOUS
  Filled 2016-05-26: qty 500

## 2016-05-26 MED ORDER — SODIUM CHLORIDE 0.9 % IV BOLUS (SEPSIS): 1000.0000 mL | Freq: Once | INTRAVENOUS | Status: DC

## 2016-05-26 MED ORDER — DEXTROSE 5 % IV SOLN: 500.0000 mg | Freq: Once | INTRAVENOUS | Status: DC

## 2016-05-26 MED ORDER — CEFTRIAXONE SODIUM-DEXTROSE 1-3.74 GM-% IV SOLR
1.0000 g | Freq: Every day | INTRAVENOUS | Status: AC
  Administered 2016-05-26 – 2016-05-30 (×4): 1 g via INTRAVENOUS
  Filled 2016-05-26 (×5): qty 50

## 2016-05-26 MED ORDER — FERROUS SULFATE 325 (65 FE) MG PO TABS
325.0000 mg | ORAL_TABLET | Freq: Every day | ORAL | Status: DC
  Administered 2016-05-26 – 2016-05-31 (×5): 325 mg via ORAL
  Filled 2016-05-26 (×5): qty 1

## 2016-05-26 MED ORDER — ACETAMINOPHEN 650 MG RE SUPP: 650.0000 mg | Freq: Four times a day (QID) | RECTAL | Status: DC | PRN

## 2016-05-26 MED ORDER — CARBAMAZEPINE 200 MG PO TABS: 500.0000 mg | ORAL_TABLET | Freq: Two times a day (BID) | ORAL | Status: DC

## 2016-05-26 MED ORDER — CLONIDINE HCL 0.1 MG PO TABS
0.2000 mg | ORAL_TABLET | Freq: Three times a day (TID) | ORAL | Status: DC
  Administered 2016-05-26 – 2016-05-31 (×11): 0.2 mg via ORAL
  Filled 2016-05-26 (×11): qty 2

## 2016-05-26 MED ORDER — BISACODYL 5 MG PO TBEC: 5.0000 mg | DELAYED_RELEASE_TABLET | Freq: Every day | ORAL | Status: DC | PRN

## 2016-05-26 MED ORDER — ONDANSETRON HCL 4 MG PO TABS: 4.0000 mg | ORAL_TABLET | Freq: Four times a day (QID) | ORAL | Status: DC | PRN

## 2016-05-26 MED ORDER — CARBAMAZEPINE ER 400 MG PO TB12
1000.0000 mg | ORAL_TABLET | Freq: Every morning | ORAL | Status: DC
  Administered 2016-05-27 – 2016-05-31 (×5): 1000 mg via ORAL
  Filled 2016-05-26 (×5): qty 1

## 2016-05-26 MED ORDER — CYCLOBENZAPRINE HCL 10 MG PO TABS: 5.0000 mg | ORAL_TABLET | Freq: Every evening | ORAL | Status: DC | PRN

## 2016-05-26 MED ORDER — CEFEPIME-DEXTROSE 2 GM/50ML IV SOLR
2.0000 g | Freq: Once | INTRAVENOUS | Status: AC
  Administered 2016-05-26: 2 g via INTRAVENOUS
  Filled 2016-05-26: qty 50

## 2016-05-26 MED ORDER — CALCIUM ACETATE (PHOS BINDER) 667 MG PO CAPS: 1334.0000 mg | ORAL_CAPSULE | Freq: Three times a day (TID) | ORAL | Status: DC

## 2016-05-26 MED ORDER — DEXTROSE 5 % IV SOLN: 1.0000 g | Freq: Once | INTRAVENOUS | Status: DC

## 2016-05-26 MED ORDER — FLUPHENAZINE HCL 5 MG PO TABS
25.0000 mg | ORAL_TABLET | Freq: Every day | ORAL | Status: DC
  Administered 2016-05-26 – 2016-05-31 (×5): 25 mg via ORAL
  Filled 2016-05-26 (×7): qty 5

## 2016-05-26 MED ORDER — DEXTROSE 5 % IV SOLN
500.0000 mg | Freq: Once | INTRAVENOUS | Status: DC
  Filled 2016-05-26 (×2): qty 500

## 2016-05-26 MED ORDER — MOMETASONE FURO-FORMOTEROL FUM 200-5 MCG/ACT IN AERO
2.0000 | INHALATION_SPRAY | Freq: Two times a day (BID) | RESPIRATORY_TRACT | Status: DC
  Administered 2016-05-26 – 2016-05-31 (×10): 2 via RESPIRATORY_TRACT
  Filled 2016-05-26 (×2): qty 8.8

## 2016-05-26 MED ORDER — CITALOPRAM HYDROBROMIDE 20 MG PO TABS: 20.0000 mg | ORAL_TABLET | Freq: Every day | ORAL | Status: DC

## 2016-05-26 MED ORDER — HEPARIN SODIUM (PORCINE) 5000 UNIT/ML IJ SOLN
5000.0000 [IU] | Freq: Three times a day (TID) | INTRAMUSCULAR | Status: DC
  Administered 2016-05-26 – 2016-05-31 (×11): 5000 [IU] via SUBCUTANEOUS
  Filled 2016-05-26 (×12): qty 1

## 2016-05-26 MED ORDER — ALBUTEROL SULFATE HFA 108 (90 BASE) MCG/ACT IN AERS: 1.0000 | INHALATION_SPRAY | Freq: Four times a day (QID) | RESPIRATORY_TRACT | Status: DC | PRN

## 2016-05-26 MED ORDER — FLUTICASONE FUROATE-VILANTEROL 100-25 MCG/INH IN AEPB
1.0000 | INHALATION_SPRAY | Freq: Every day | RESPIRATORY_TRACT | Status: DC
  Administered 2016-05-27 – 2016-05-31 (×4): 1 via RESPIRATORY_TRACT
  Filled 2016-05-26: qty 28

## 2016-05-26 MED ORDER — HYDROCODONE-ACETAMINOPHEN 5-325 MG PO TABS
1.0000 | ORAL_TABLET | ORAL | Status: DC | PRN
  Administered 2016-05-27 – 2016-05-29 (×3): 2 via ORAL
  Filled 2016-05-26 (×3): qty 2

## 2016-05-26 MED ORDER — VANCOMYCIN HCL IN DEXTROSE 1-5 GM/200ML-% IV SOLN
1000.0000 mg | Freq: Once | INTRAVENOUS | Status: AC
  Administered 2016-05-26: 1000 mg via INTRAVENOUS
  Filled 2016-05-26: qty 200

## 2016-05-26 MED ORDER — CHLORHEXIDINE GLUCONATE CLOTH 2 % EX PADS
6.0000 | MEDICATED_PAD | Freq: Every day | CUTANEOUS | Status: DC
  Administered 2016-05-27 – 2016-05-30 (×4): 6 via TOPICAL

## 2016-05-26 MED ORDER — MINOXIDIL 2.5 MG PO TABS
5.0000 mg | ORAL_TABLET | Freq: Every day | ORAL | Status: DC
  Administered 2016-05-27 – 2016-05-31 (×4): 5 mg via ORAL
  Filled 2016-05-26 (×5): qty 2

## 2016-05-26 MED ORDER — VITAMIN D 1000 UNITS PO TABS
1000.0000 [IU] | ORAL_TABLET | Freq: Every day | ORAL | Status: DC
  Administered 2016-05-27 – 2016-05-31 (×4): 1000 [IU] via ORAL
  Filled 2016-05-26 (×4): qty 1

## 2016-05-26 MED ORDER — MUPIROCIN 2 % EX OINT
1.0000 "application " | TOPICAL_OINTMENT | Freq: Two times a day (BID) | CUTANEOUS | Status: AC
  Administered 2016-05-26 – 2016-05-31 (×10): 1 via NASAL
  Filled 2016-05-26: qty 22

## 2016-05-26 MED ORDER — SODIUM CHLORIDE 0.9% FLUSH
3.0000 mL | Freq: Two times a day (BID) | INTRAVENOUS | Status: DC
  Administered 2016-05-26 – 2016-05-31 (×10): 3 mL via INTRAVENOUS

## 2016-05-26 MED ORDER — CEFTRIAXONE SODIUM-DEXTROSE 1-3.74 GM-% IV SOLR: 1.0000 g | Freq: Once | INTRAVENOUS | Status: DC

## 2016-05-26 MED ORDER — TRIHEXYPHENIDYL HCL 5 MG PO TABS
5.0000 mg | ORAL_TABLET | Freq: Three times a day (TID) | ORAL | Status: DC
Start: 2016-05-27 — End: 2016-05-31
  Administered 2016-05-27 – 2016-05-31 (×12): 5 mg via ORAL
  Filled 2016-05-26 (×13): qty 1

## 2016-05-26 MED ORDER — BACLOFEN 10 MG PO TABS: 10.0000 mg | ORAL_TABLET | Freq: Two times a day (BID) | ORAL | Status: DC | PRN

## 2016-05-26 MED ORDER — ALBUTEROL SULFATE (2.5 MG/3ML) 0.083% IN NEBU: 2.5000 mg | INHALATION_SOLUTION | Freq: Four times a day (QID) | RESPIRATORY_TRACT | Status: DC | PRN

## 2016-05-26 MED ORDER — ATENOLOL 25 MG PO TABS
25.0000 mg | ORAL_TABLET | Freq: Every day | ORAL | Status: DC
  Administered 2016-05-27 – 2016-05-31 (×4): 25 mg via ORAL
  Filled 2016-05-26 (×4): qty 1

## 2016-05-26 MED ORDER — CALCIUM ACETATE (PHOS BINDER) 667 MG PO CAPS
1334.0000 mg | ORAL_CAPSULE | Freq: Three times a day (TID) | ORAL | Status: DC
  Administered 2016-05-27 – 2016-05-31 (×14): 1334 mg via ORAL
  Filled 2016-05-26 (×15): qty 2

## 2016-05-26 MED ORDER — CEFTRIAXONE SODIUM 1 G IJ SOLR: 1.0000 g | Freq: Once | INTRAMUSCULAR | Status: DC

## 2016-05-26 MED ORDER — ONDANSETRON HCL 4 MG/2ML IJ SOLN: 4.0000 mg | Freq: Four times a day (QID) | INTRAMUSCULAR | Status: DC | PRN

## 2016-05-26 MED ORDER — ACETAMINOPHEN 325 MG PO TABS: 650.0000 mg | ORAL_TABLET | Freq: Four times a day (QID) | ORAL | Status: DC | PRN

## 2016-05-26 MED ORDER — AMLODIPINE BESYLATE 10 MG PO TABS
10.0000 mg | ORAL_TABLET | Freq: Every day | ORAL | Status: DC
  Administered 2016-05-27 – 2016-05-31 (×5): 10 mg via ORAL
  Filled 2016-05-26 (×5): qty 1

## 2016-05-26 MED ORDER — HYDRALAZINE HCL 50 MG PO TABS
100.0000 mg | ORAL_TABLET | Freq: Four times a day (QID) | ORAL | Status: DC
  Administered 2016-05-27 (×2): 100 mg via ORAL
  Filled 2016-05-26 (×4): qty 2

## 2016-05-26 MED ORDER — DOCUSATE SODIUM 100 MG PO CAPS
100.0000 mg | ORAL_CAPSULE | Freq: Two times a day (BID) | ORAL | Status: DC
  Administered 2016-05-26 – 2016-05-31 (×9): 100 mg via ORAL
  Filled 2016-05-26 (×9): qty 1

## 2016-05-26 MED ORDER — SODIUM CHLORIDE 0.9 % IV BOLUS (SEPSIS): 500.0000 mL | Freq: Once | INTRAVENOUS | Status: DC

## 2016-05-26 NOTE — Progress Notes (Signed)
2.5 NS IVF boluses not administered in ED because of renal patient, fluid overload.

## 2016-05-26 NOTE — ED Provider Notes (Signed)
[facility] Emergency Department Provider Note   ____________________________________________    I have reviewed the triage vital signs and the nursing notes.   HISTORY  Chief Complaint Shortness of Breath and Cough     HPI Todd Brown is a 55 y.o. male who presents with severe shortness of breath. Patient has a history of end-stage renal disease, stomach dialyzed Monday Wednesday Friday. He reports he did not get dialysis yesterday because of the holidays. He was recently in the hospital for medical records and treated for hyperkalemia. He says he has felt chills and has a significant cough. He denies chest pain.   Past Medical History:  Diagnosis Date  . Anemia   . Diabetes mellitus without complication ([place])   . Dialysis patient [facility])    Mon. -Wed.- Fri  . ESRD (end stage renal disease) ([place])   . Hyperlipidemia   . Malignant hypertension   . Renal artery stenosis ([place])   . Schizophrenia [facility])     Patient Active Problem List   Diagnosis Date Noted  . Dialysis patient ([place]) 04/15/2016  . End stage renal disease ([place]) 02/12/2016  . Hyperkalemia 05/20/2015  . Hepatitis C 09/26/2013  . Schizophrenia ([place]) 03/23/2013  . Essential hypertension, benign 03/23/2013  . Chronic kidney disease 03/23/2013    Past Surgical History:  Procedure Laterality Date  . AV FISTULA PLACEMENT Left 12/26/2014   Procedure: ARTERIOVENOUS (AV) FISTULA CREATION;  Surgeon: Algernon Huxley, MD;  Location: [facility] ORS;  Service: Vascular;  Laterality: Left;  . INSERTION OF DIALYSIS CATHETER Right   . PERIPHERAL VASCULAR CATHETERIZATION N/A 10/22/2014   Procedure: Dialysis/Perma Catheter Insertion;  Surgeon: Algernon Huxley, MD;  Location: [place] CV LAB;  Service: Cardiovascular;  Laterality: N/A;  . PERIPHERAL VASCULAR CATHETERIZATION N/A 02/28/2015   Procedure: Dialysis/Perma Catheter Removal;  Surgeon: Algernon Huxley, MD;  Location: [place] CV LAB;  Service:  Cardiovascular;  Laterality: N/A;  . Repair fx left lower leg     yrs ago (age 60)    Prior to Admission medications   Medication Sig Start Date End Date Taking? Authorizing Provider  acetaminophen (TYLENOL) 500 MG tablet Take 500 mg by mouth every 4 (four) hours as needed for mild pain. Take 1 tablet every 4-6 hours as needed for pain.   Yes Historical Provider, MD  albuterol (PROAIR HFA) 108 (90 Base) MCG/ACT inhaler Inhale 1-2 puffs into the lungs every 6 (six) hours as needed for wheezing or shortness of breath (cough).   Yes Historical Provider, MD  b complex-vitamin c-folic acid (NEPHRO-VITE) 0.8 MG TABS tablet Take 1 tablet by mouth daily.   Yes Historical Provider, MD  baclofen (LIORESAL) 10 MG tablet Take 1 tablet (10 mg total) by mouth 2 (two) times daily as needed for muscle spasms. 05/19/15  Yes Fritzi Mandes, MD  carbamazepine (TEGRETOL XR) 200 MG 12 hr tablet Take 1,000 mg by mouth every morning.   Yes Historical Provider, MD  cyclobenzaprine (FLEXERIL) 5 MG tablet Take 5 mg by mouth at bedtime as needed for muscle spasms.   Yes Historical Provider, MD  fluticasone furoate-vilanterol (BREO ELLIPTA) 100-25 MCG/INH AEPB Inhale 1 puff into the lungs daily.   Yes Historical Provider, MD  Fluticasone-Salmeterol (ADVAIR DISKUS) 250-50 MCG/DOSE AEPB Inhale 1 puff into the lungs 2 (two) times daily.   Yes Historical Provider, MD  hydrALAZINE (APRESOLINE) 100 MG tablet Take 1 tablet (100 mg total) by mouth every 6 (six) hours. 05/19/15  Yes  Fritzi Mandes, MD  traMADol (ULTRAM) 50 MG tablet Take 1 tablet (50 mg total) by mouth every 6 (six) hours as needed. 05/19/15  Yes Fritzi Mandes, MD  trihexyphenidyl (ARTANE) 5 MG tablet Take 5 mg by mouth 3 (three) times daily with meals.   Yes Historical Provider, MD  amLODipine (NORVASC) 10 MG tablet Take 1 tablet (10 mg total) by mouth at bedtime. 05/19/15   Fritzi Mandes, MD  amLODipine (NORVASC) 10 MG tablet Take 10 mg by mouth daily.    Historical Provider, MD   atenolol (TENORMIN) 25 MG tablet Take 1 tablet (25 mg total) by mouth 2 (two) times daily. Patient not taking: Reported on 04/14/2016 05/19/15   Fritzi Mandes, MD  atenolol (TENORMIN) 25 MG tablet Take 25 mg by mouth daily.    Historical Provider, MD  calcium acetate (PHOSLO) 667 MG capsule Take 2 capsules (1,334 mg total) by mouth 3 (three) times daily with meals. Patient not taking: Reported on 04/14/2016 05/19/15   Fritzi Mandes, MD  calcium acetate (PHOSLO) 667 MG capsule Take 1,334 mg by mouth 3 (three) times daily with meals.     Historical Provider, MD  carbamazepine (TEGRETOL) 200 MG tablet Take 2.5 tablets (500 mg total) by mouth 2 (two) times daily. Taken 2 1/2 tabs (500mg  ) twice a day Patient not taking: Reported on 04/14/2016 10/31/14   Gladstone Lighter, MD  cholecalciferol (VITAMIN D) 1000 UNITS tablet Take 1,000 Units by mouth daily.    Historical Provider, MD  citalopram (CELEXA) 20 MG tablet Take 1 tablet (20 mg total) by mouth daily. Patient not taking: Reported on 04/14/2016 05/19/15   Fritzi Mandes, MD  cloNIDine (CATAPRES) 0.2 MG tablet Take 1 tablet (0.2 mg total) by mouth 3 (three) times daily. 05/19/15   Fritzi Mandes, MD  ferrous sulfate 325 (65 FE) MG tablet Take 1 tablet (325 mg total) by mouth daily. Patient not taking: Reported on 05/26/2016 05/19/15   Fritzi Mandes, MD  fluPHENAZine (PROLIXIN) 2.5 MG tablet Take 25 mg by mouth daily.     Historical Provider, MD  minoxidil (LONITEN) 2.5 MG tablet Take 2 tablets (5 mg total) by mouth daily. 05/19/15   Fritzi Mandes, MD     Allergies Thorazine [chlorpromazine]  Family History  Problem Relation Age of Onset  . Diabetes Neg Hx     Social History Social History  Substance Use Topics  . Smoking status: Current Every Day Smoker    Packs/day: 0.50    Types: Cigarettes  . Smokeless tobacco: Never Used     Comment: 10 cigars a day  . Alcohol use No    Review of Systems  Constitutional:Positive chills Eyes: No visual changes.    Cardiovascular: Denies chest pain. Respiratory: Positive shortness of breath Gastrointestinal: No abdominal pain.  No nausea, no vomiting.    Musculoskeletal: Negative for back pain. Skin: Negative for rash. Neurological: Negative for headaches   10-point ROS otherwise negative.  ____________________________________________   PHYSICAL EXAM:  VITAL SIGNS: ED Triage Vitals  Enc Vitals Group     BP 05/26/16 1211 (!) 147/101     Pulse Rate 05/26/16 1211 88     Resp 05/26/16 1211 (!) 21     Temp 05/26/16 1211 (!) 102.9 F (39.4 C)     Temp Source 05/26/16 1211 Oral     SpO2 05/26/16 1211 90 %     Weight 05/26/16 1212 170 lb (77.1 kg)     Height 05/26/16 1212 6\' 3"  (1.905 m)  Head Circumference --      Peak Flow --      Pain Score 05/26/16 1212 8     Pain Loc --      Pain Edu? --      Excl. in [place]? --     Constitutional: Alert and oriented. No acute distress.  Eyes: Conjunctivae are normal.   Nose: No congestion/rhinnorhea. Mouth/Throat: Mucous membranes are moist.   Neck:  Painless ROM Cardiovascular: Normal rate, regular rhythm. Grossly normal heart sounds.  Good peripheral circulation. Respiratory: Normal respiratory effort.  No retractions. Bibasilar rales Gastrointestinal: Soft and nontender. No distention.  No CVA tenderness. Genitourinary: deferred Musculoskeletal: 2+ lower extremity edema bilaterally, no tenderness to palpation.  Warm and well perfused. Left AV fistula positive thrill, no erythema or discharge Neurologic:  Normal speech and language. No gross focal neurologic deficits are appreciated.  Skin:  Skin is warm, dry and intact. No rash noted. Psychiatric: Mood and affect are normal. Speech and behavior are normal.  ____________________________________________   LABS (all labs ordered are listed, but only abnormal results are displayed)  Labs Reviewed  CBC WITH DIFFERENTIAL/PLATELET - Abnormal; Notable for the following:       Result Value    WBC 12.7 (*)    RBC 3.42 (*)    Hemoglobin 12.2 (*)    HCT 35.6 (*)    MCV 103.9 (*)    MCH 35.7 (*)    Neutro Abs 11.7 (*)    Lymphs Abs 0.2 (*)    Basophils Absolute 0.2 (*)    All other components within normal limits  COMPREHENSIVE METABOLIC PANEL - Abnormal; Notable for the following:    Sodium 134 (*)    Potassium 5.4 (*)    Chloride 94 (*)    Glucose, Bld 109 (*)    BUN 48 (*)    Creatinine, Ser 7.13 (*)    AST 81 (*)    GFR calc non Af Amer 8 (*)    GFR calc Af Amer 9 (*)    Anion gap 16 (*)    All other components within normal limits  TROPONIN I - Abnormal; Notable for the following:    Troponin I 0.07 (*)    All other components within normal limits  LACTIC ACID, PLASMA - Abnormal; Notable for the following:    Lactic Acid, Venous 2.4 (*)    All other components within normal limits  CULTURE, BLOOD (ROUTINE X 2)  CULTURE, BLOOD (ROUTINE X 2)  LACTIC ACID, PLASMA   ____________________________________________  EKG  ED ECG REPORT I, Lavonia Drafts, the attending physician, personally viewed and interpreted this ECG.  Date: 05/26/2016  Rate: 87 Rhythm: normal sinus rhythm QRS Axis: normal Intervals: normal ST/T Wave abnormalities: normal Conduction Disturbances: Left posterior fascicular block   ____________________________________________  RADIOLOGY  Chest x-ray concerning for interstitial edema ____________________________________________   PROCEDURES  Procedure(s) performed: No    Critical Care performed: No ____________________________________________   INITIAL IMPRESSION / ASSESSMENT AND PLAN / ED COURSE  Pertinent labs & imaging results that were available during my care of the patient were reviewed by me and considered in my medical decision making (see chart for details).  Patient presents with oxygen saturations in the 70s which responded well to nasal cannula oxygen. This is likely a combination of interstitial edema but also  possibly pneumonia given his fever and elevated lactic acid. Patient treated as a code sepsis with cefepime and vancomycin given recent hospital stay. Admitted to the hospitalist  service  Clinical Course    ____________________________________________   FINAL CLINICAL IMPRESSION(S) / ED DIAGNOSES  Final diagnoses:  Sepsis, due to unspecified organism ([place])  Acute pulmonary edema ([place])  Acute respiratory distress      NEW MEDICATIONS STARTED DURING THIS VISIT:  New Prescriptions   No medications on file     Note:  This document was prepared using Dragon voice recognition software and may include unintentional dictation errors.    Lavonia Drafts, MD 05/26/16 [phone]

## 2016-05-26 NOTE — H&P (Signed)
[place] [place] NAME: Todd Brown    MR#:  [id number]  DATE OF BIRTH:  [birth year]  DATE OF ADMISSION:  05/26/2016  PRIMARY CARE PHYSICIAN: No PCP Per Patient   REQUESTING/REFERRING PHYSICIAN: Lavonia Drafts, MD  CHIEF COMPLAINT:   Chief Complaint  Patient presents with  . Shortness of Breath  . Cough    HISTORY OF PRESENT ILLNESS:  Todd Brown  is a 55 y.o. male with a known history of End-stage renal disease on hemodialysis Monday Wednesday Friday, diabetes, schizophrenia and is being admitted for acute on chronic hypoxic respiratory failure.  Patient presents with severe shortness of breath. Patient reports he did not get dialysis yesterday because of the holidays. He reports fever, chills and has a significant cough. He denies chest pain, but more of chest pressure, likely from constant coughing.  While in the emergency department his temperature was 102.9, RR 32, elevated lactate of 2.4 & has leukocytosis. PAST MEDICAL HISTORY:   Past Medical History:  Diagnosis Date  . Anemia   . Diabetes mellitus without complication ([place])   . Dialysis patient [facility])    Mon. -Wed.- Fri  . ESRD (end stage renal disease) ([place])   . Hyperlipidemia   . Malignant hypertension   . Renal artery stenosis ([place])   . Schizophrenia ([place])     PAST SURGICAL HISTORY:   Past Surgical History:  Procedure Laterality Date  . AV FISTULA PLACEMENT Left 12/26/2014   Procedure: ARTERIOVENOUS (AV) FISTULA CREATION;  Surgeon: Algernon Huxley, MD;  Location: [facility] ORS;  Service: Vascular;  Laterality: Left;  . INSERTION OF DIALYSIS CATHETER Right   . PERIPHERAL VASCULAR CATHETERIZATION N/A 10/22/2014   Procedure: Dialysis/Perma Catheter Insertion;  Surgeon: Algernon Huxley, MD;  Location: [place] CV LAB;  Service: Cardiovascular;  Laterality: N/A;  . PERIPHERAL VASCULAR CATHETERIZATION N/A 02/28/2015   Procedure: Dialysis/Perma Catheter Removal;  Surgeon:  Algernon Huxley, MD;  Location: [place] CV LAB;  Service: Cardiovascular;  Laterality: N/A;  . Repair fx left lower leg     yrs ago (age 37)    SOCIAL HISTORY:   Social History  Substance Use Topics  . Smoking status: Current Every Day Smoker    Packs/day: 0.50    Types: Cigarettes  . Smokeless tobacco: Never Used     Comment: 10 cigars a day  . Alcohol use No    FAMILY HISTORY:   Family History  Problem Relation Age of Onset  . Diabetes Neg Hx     DRUG ALLERGIES:   Allergies  Allergen Reactions  . Thorazine [Chlorpromazine] Other (See Comments)    Reaction:  Unknown , pt states it makes him feel real bad    REVIEW OF SYSTEMS:   Review of Systems  Constitutional: Positive for chills, diaphoresis, fever and malaise/fatigue. Negative for weight loss.  HENT: Negative for nosebleeds and sore throat.   Eyes: Negative for blurred vision.  Respiratory: Positive for cough and shortness of breath. Negative for wheezing.   Cardiovascular: Negative for chest pain, orthopnea, leg swelling and PND.  Gastrointestinal: Negative for abdominal pain, constipation, diarrhea, heartburn, nausea and vomiting.  Genitourinary: Negative for dysuria and urgency.  Musculoskeletal: Negative for back pain.  Skin: Negative for rash.  Neurological: Positive for weakness. Negative for dizziness, speech change, focal weakness and headaches.  Endo/Heme/Allergies: Does not bruise/bleed easily.  Psychiatric/Behavioral: Negative for depression.   MEDICATIONS AT HOME:   Prior to Admission medications  Medication Sig Start Date End Date Taking? Authorizing Provider  acetaminophen (TYLENOL) 500 MG tablet Take 500 mg by mouth every 4 (four) hours as needed for mild pain. Take 1 tablet every 4-6 hours as needed for pain.   Yes Historical Provider, MD  albuterol (PROAIR HFA) 108 (90 Base) MCG/ACT inhaler Inhale 1-2 puffs into the lungs every 6 (six) hours as needed for wheezing or shortness of breath  (cough).   Yes Historical Provider, MD  b complex-vitamin c-folic acid (NEPHRO-VITE) 0.8 MG TABS tablet Take 1 tablet by mouth daily.   Yes Historical Provider, MD  baclofen (LIORESAL) 10 MG tablet Take 1 tablet (10 mg total) by mouth 2 (two) times daily as needed for muscle spasms. 05/19/15  Yes Fritzi Mandes, MD  carbamazepine (TEGRETOL XR) 200 MG 12 hr tablet Take 1,000 mg by mouth every morning.   Yes Historical Provider, MD  cyclobenzaprine (FLEXERIL) 5 MG tablet Take 5 mg by mouth at bedtime as needed for muscle spasms.   Yes Historical Provider, MD  fluPHENAZine decanoate (PROLIXIN) 25 MG/ML injection Inject 25 mg into the muscle every 14 (fourteen) days.   Yes Historical Provider, MD  fluticasone furoate-vilanterol (BREO ELLIPTA) 100-25 MCG/INH AEPB Inhale 1 puff into the lungs daily.   Yes Historical Provider, MD  Fluticasone-Salmeterol (ADVAIR DISKUS) 250-50 MCG/DOSE AEPB Inhale 1 puff into the lungs 2 (two) times daily.   Yes Historical Provider, MD  hydrALAZINE (APRESOLINE) 100 MG tablet Take 1 tablet (100 mg total) by mouth every 6 (six) hours. 05/19/15  Yes Fritzi Mandes, MD  traMADol (ULTRAM) 50 MG tablet Take 1 tablet (50 mg total) by mouth every 6 (six) hours as needed. 05/19/15  Yes Fritzi Mandes, MD  trihexyphenidyl (ARTANE) 5 MG tablet Take 5 mg by mouth 3 (three) times daily with meals.   Yes Historical Provider, MD  amLODipine (NORVASC) 10 MG tablet Take 10 mg by mouth daily.    Historical Provider, MD  atenolol (TENORMIN) 25 MG tablet Take 25 mg by mouth daily.    Historical Provider, MD  calcium acetate (PHOSLO) 667 MG capsule Take 2 capsules (1,334 mg total) by mouth 3 (three) times daily with meals. Patient not taking: Reported on 04/14/2016 05/19/15   Fritzi Mandes, MD  calcium acetate (PHOSLO) 667 MG capsule Take 1,334 mg by mouth 3 (three) times daily with meals.     Historical Provider, MD  carbamazepine (TEGRETOL) 200 MG tablet Take 2.5 tablets (500 mg total) by mouth 2 (two) times  daily. Taken 2 1/2 tabs (500mg  ) twice a day Patient not taking: Reported on 04/14/2016 10/31/14   Gladstone Lighter, MD  cholecalciferol (VITAMIN D) 1000 UNITS tablet Take 1,000 Units by mouth daily.    Historical Provider, MD  citalopram (CELEXA) 20 MG tablet Take 1 tablet (20 mg total) by mouth daily. Patient not taking: Reported on 04/14/2016 05/19/15   Fritzi Mandes, MD  cloNIDine (CATAPRES) 0.2 MG tablet Take 1 tablet (0.2 mg total) by mouth 3 (three) times daily. 05/19/15   Fritzi Mandes, MD  ferrous sulfate 325 (65 FE) MG tablet Take 1 tablet (325 mg total) by mouth daily. Patient not taking: Reported on 05/26/2016 05/19/15   Fritzi Mandes, MD  fluPHENAZine (PROLIXIN) 2.5 MG tablet Take 25 mg by mouth daily.     Historical Provider, MD  minoxidil (LONITEN) 2.5 MG tablet Take 2 tablets (5 mg total) by mouth daily. 05/19/15   Fritzi Mandes, MD      VITAL SIGNS:  Blood pressure Marland Kitchen)  162/92, pulse 87, temperature 99.9 F (37.7 C), temperature source Oral, resp. rate (!) 32, height 6\' 3"  (1.905 m), weight 76.7 kg (169 lb 1.5 oz), SpO2 91 %.  PHYSICAL EXAMINATION:  Physical Exam  GENERAL:  55 y.o.-year-old patient lying in the bed in acute respi. distress.  He is sweating profusely EYES: Pupils equal, round, reactive to light and accommodation. No scleral icterus. Extraocular muscles intact.  HEENT: Head atraumatic, normocephalic. Oropharynx and nasopharynx clear.  NECK:  Supple, no jugular venous distention. No thyroid enlargement, no tenderness.  LUNGS: Decreased breath sounds bilaterally, no wheezing, + rales,rhonchi or crepitation.  He is using accessory muscles of respiration.  CARDIOVASCULAR: S1, S2 normal. No murmurs, rubs, or gallops.  ABDOMEN: Soft, nontender, nondistended. Bowel sounds present. No organomegaly or mass.  EXTREMITIES: No pedal edema, cyanosis, or clubbing.  NEUROLOGIC: Cranial nerves II through XII are intact. Muscle strength 5/5 in all extremities. Sensation intact. Gait not  checked.  PSYCHIATRIC: The patient is alert and oriented x 3.  SKIN: No obvious rash, lesion, or ulcer.  LABORATORY PANEL:   CBC  Recent Labs Lab 05/26/16 1241  WBC 12.7*  HGB 12.2*  HCT 35.6*  PLT 181   ------------------------------------------------------------------------------------------------------------------  Chemistries   Recent Labs Lab 05/26/16 1241  NA 134*  K 5.4*  CL 94*  CO2 24  GLUCOSE 109*  BUN 48*  CREATININE 7.13*  CALCIUM 9.4  AST 81*  ALT 55  ALKPHOS 124  BILITOT 0.5   ------------------------------------------------------------------------------------------------------------------  Cardiac Enzymes  Recent Labs Lab 05/26/16 1241  TROPONINI 0.07*   ------------------------------------------------------------------------------------------------------------------  RADIOLOGY:  Dg Chest Port 1 View  Result Date: 05/26/2016 CLINICAL DATA:  Shortness breath, cough for 2 days, fever EXAM: PORTABLE CHEST 1 VIEW COMPARISON:  Chest x-ray of 05/16/2015 FINDINGS: There is moderate cardiomegaly present. There are somewhat prominent interstitial markings suggesting mild interstitial edema. No definite effusion is seen. The bony thorax appears intact. IMPRESSION: Cardiomegaly and probable mild interstitial edema. Electronically Signed   By: Ivar Drape M.D.   On: 05/26/2016 13:05   IMPRESSION AND PLAN:  55 year old male being admitted for sepsis  * Sepsis: Present on admission - fever, leukocytosis, elevated lactic acid.  The likely source being lung - Monitor in ICU as stepdown  * Acute on chronic respiratory failure - Likely due to fluid overload from missing dialysis session - Cannot rule out underlying pneumonia.  He was febrile while in the emergency department - We will empirically cover him with antibiotics  * Elevated troponin - due to supply demand ischemia from sepsis - Get serial troponins and obtain 2-D echo - Some nursing concern  around ST elevation in one lead - obtained 12-lead EKG.  Reviewed with cardiology, - this is ruled out - pt does not have any chest pain  * Hypertension - Continue home medication and adjust as needed  * End-stage renal disease on hemodialysis - Will need urgent hemodialysis while at bedside in ICU  * Hyperkalemia - should correct with hemodialysis    All the records are reviewed and case discussed with ED provider. Management plans discussed with the patient, family and they are in agreement.  CODE STATUS: Full code  TOTAL TIME (critical care) TAKING CARE OF THIS PATIENT: 35 minutes.    Max Sane M.D on 05/26/2016 at 6:14 PM  Between 7am to 6pm - Pager - [phone]  After 6pm go to [email or web address] - [facility]  Office  [phone]  CC: Primary  care physician; No PCP Per Patient   Note: This dictation was prepared with Dragon dictation along with smaller phrase technology. Any transcriptional errors that result from this process are unintentional.

## 2016-05-26 NOTE — Progress Notes (Signed)
Pre dialysis assessment 

## 2016-05-26 NOTE — Progress Notes (Signed)
Post HD assessment  

## 2016-05-26 NOTE — Progress Notes (Signed)
[facility]  ROUNDING NOTE   Subjective:   Todd Brown admitted on 05/26/2016 for Acute respiratory distress [R06.03] Acute pulmonary edema ([place]) [J81.0] Sepsis, due to unspecified organism ([place]) [A41.9]    Objective:  Vital signs in last 24 hours:  Temp:  [99.7 F (37.6 C)-102.9 F (39.4 C)] 99.8 F (37.7 C) (12/26 1808) Pulse Rate:  [75-89] 78 (12/26 1915) Resp:  [17-40] 27 (12/26 1915) BP: (135-162)/(84-120) 138/85 (12/26 1915) SpO2:  [89 %-96 %] 93 % (12/26 1915) Weight:  [76.7 kg (169 lb 1.5 oz)-77.1 kg (170 lb)] 76.7 kg (169 lb 1.5 oz) (12/26 1808)  Weight change:  Filed Weights   05/26/16 1212 05/26/16 1716 05/26/16 1808  Weight: 77.1 kg (170 lb) 76.7 kg (169 lb 1.5 oz) 76.7 kg (169 lb 1.5 oz)    Intake/Output: I/O last 3 completed shifts: In: 17 [IV Piggyback:50] Out: -    Intake/Output this shift:  No intake/output data recorded.  Physical Exam: General: In respiratory distress  Head: Normocephalic, atraumatic. Moist oral mucosal membranes  Eyes: Anicteric, PERRL  Neck: Supple, trachea midline  Lungs:  Bilateral crackles  Heart: Regular rate and rhythm  Abdomen:  Soft, nontender,   Extremities: trace peripheral edema.  Neurologic: Nonfocal, moving all four extremities  Skin: No lesions  Access: Left arm AVF    Basic Metabolic Panel:  Recent Labs Lab 05/26/16 1241 05/26/16 1830  NA 134* 130*  K 5.4* 5.5*  CL 94* 93*  CO2 24 24  GLUCOSE 109* 115*  BUN 48* 54*  CREATININE 7.13* 7.47*  CALCIUM 9.4 8.9    Liver Function Tests:  Recent Labs Lab 05/26/16 1241 05/26/16 1830  AST 81* 65*  ALT 55 49  ALKPHOS 124 106  BILITOT 0.5 0.9  PROT 7.7 6.9  ALBUMIN 3.9 3.5   No results for input(s): LIPASE, AMYLASE in the last 168 hours. No results for input(s): AMMONIA in the last 168 hours.  CBC:  Recent Labs Lab 05/26/16 1241 05/26/16 1830  WBC 12.7* 11.7*  NEUTROABS 11.7* 10.0*  HGB 12.2* 11.8*  HCT 35.6* 33.7*   MCV 103.9* 103.7*  PLT 181 171    Cardiac Enzymes:  Recent Labs Lab 05/26/16 1241 05/26/16 1830  TROPONINI 0.07* 0.09*    BNP: Invalid input(s): POCBNP  CBG: No results for input(s): GLUCAP in the last 168 hours.  Microbiology: Results for orders placed or performed during the hospital encounter of 05/26/16  MRSA PCR Screening     Status: Abnormal   Collection Time: 05/26/16  5:23 PM  Result Value Ref Range Status   MRSA by PCR POSITIVE (A) NEGATIVE Final    Comment:        The GeneXpert MRSA Assay (FDA approved for NASAL specimens only), is one component of a comprehensive MRSA colonization surveillance program. It is not intended to diagnose MRSA infection nor to guide or monitor treatment for MRSA infections. RESULT CALLED TO, READ BACK BY AND VERIFIED WITH: PAM CRAWFORD @1912  ON 05/26/2016 JLJ     Coagulation Studies:  Recent Labs  05/26/16 1830  LABPROT 14.7  INR 1.14    Urinalysis: No results for input(s): COLORURINE, LABSPEC, PHURINE, GLUCOSEU, HGBUR, BILIRUBINUR, KETONESUR, PROTEINUR, UROBILINOGEN, NITRITE, LEUKOCYTESUR in the last 72 hours.  Invalid input(s): APPERANCEUR    Imaging: Dg Chest Port 1 View  Result Date: 05/26/2016 CLINICAL DATA:  Admitted on 05/26/2016 with pneumonia, sepsis, history of anemia, diabetes, ESRD, hypertension EXAM: PORTABLE CHEST 1 VIEW COMPARISON:  05/26/2016 FINDINGS: Mild-to-moderate enlargement  of the cardiopericardial silhouette. Stable mild interstitial accentuation in both lungs. No airspace opacity identified; vague density in the left mid lung is ascribed to the extension of the ECG lead. No blunting of the costophrenic angles. IMPRESSION: 1. Stable interstitial accentuation in the lungs accompanied by cardiomegaly, this probably reflects mild interstitial edema. Electronically Signed   By: Van Clines M.D.   On: 05/26/2016 18:26   Dg Chest Port 1 View  Result Date: 05/26/2016 CLINICAL DATA:   Shortness breath, cough for 2 days, fever EXAM: PORTABLE CHEST 1 VIEW COMPARISON:  Chest x-ray of 05/16/2015 FINDINGS: There is moderate cardiomegaly present. There are somewhat prominent interstitial markings suggesting mild interstitial edema. No definite effusion is seen. The bony thorax appears intact. IMPRESSION: Cardiomegaly and probable mild interstitial edema. Electronically Signed   By: Ivar Drape M.D.   On: 05/26/2016 13:05     Medications:    . amLODipine  10 mg Oral Daily  . atenolol  25 mg Oral Daily  . azithromycin  500 mg Intravenous Once  . azithromycin  500 mg Intravenous Q24H  . [START ON 05/27/2016] calcium acetate  1,334 mg Oral TID WC  . [START ON 05/27/2016] carbamazepine  1,000 mg Oral q morning - 10a  . cefTRIAXone  1 g Intravenous q1800  . [START ON 05/27/2016] Chlorhexidine Gluconate Cloth  6 each Topical Q0600  . [START ON 05/27/2016] cholecalciferol  1,000 Units Oral Daily  . cloNIDine  0.2 mg Oral TID  . docusate sodium  100 mg Oral BID  . ferrous sulfate  325 mg Oral Daily  . fluPHENAZine  25 mg Oral Daily  . [START ON 05/27/2016] fluticasone furoate-vilanterol  1 puff Inhalation Daily  . heparin  5,000 Units Subcutaneous Q8H  . hydrALAZINE  100 mg Oral Q6H  . [START ON 05/27/2016] minoxidil  5 mg Oral Daily  . mometasone-formoterol  2 puff Inhalation BID  . mupirocin ointment  1 application Nasal BID  . sodium chloride flush  3 mL Intravenous Q12H  . [START ON 05/27/2016] trihexyphenidyl  5 mg Oral TID WC   acetaminophen **OR** acetaminophen, albuterol, baclofen, bisacodyl, cyclobenzaprine, HYDROcodone-acetaminophen, ondansetron **OR** ondansetron (ZOFRAN) IV  Assessment/ Plan:  [name] is a 55 y.o. black male with schizophrenia, hypertension, hepatitis C, tobacco abuse, ESRD, anemia of CKD, SHPTH   [facility] MWF [address]   1. End stage Renal Disease with hyperkalemia: emergent hemodialysis tonight due to pulmonary  edema/respiratory distress and hyperkalemia 2K bath Resume MWF schedule  2. Hypertension: volume driven - improved with dialysis - minoxidil, amlodipine, atenolol, clonidine, hydralazine  3. Anemia of chronic kidney disease: hemoglobin at goal. EPO as outpatient.   4. Secondary Hyperparathyroidism: Outpatient PTH, calcium and phosphorus is at goal - calcium acetate with meals    LOS: 0 Mischele Detter 12/26/20177:27 PM

## 2016-05-26 NOTE — Progress Notes (Signed)
12 Lead EKG obtained, Dr. Manuella Ghazi at bedside, he has spoken with Dr. Fletcher Anon, who will see patient. HD RN at bedside to initiate HD.

## 2016-05-26 NOTE — Progress Notes (Signed)
Pre dialysis  

## 2016-05-26 NOTE — ED Triage Notes (Signed)
Pt via ems from home with sob and cough x 2 days. Pt states he has not been coughing up anything. EMS advises that his O2 sat was low upon their arrival (70's) but that pt did not appear to be in any distress so they thought it was a bad reading. They placed him on 2L O2 and it got to 84%. Pt at 68% upon arrival. Pt placed on 4L, and O2 sat curently at 94%

## 2016-05-26 NOTE — Consult Note (Addendum)
Name: Todd Brown MRN: [id number] DOB: 05/16/1961    ADMISSION DATE:  05/26/2016 CONSULTATION DATE:  05/26/2016  REFERRING MD :  Dr. Manuella Ghazi  CHIEF COMPLAINT:  Shortness of Breath and Cough   BRIEF PATIENT DESCRIPTION: This is a 55 yo male who presented to [facility] ER 12/26 with acute on chronic hypoxic respiratory failure secondary to pulmonary edema and ?pneumonia   SIGNIFICANT EVENTS  12/26-Pt admitted to [facility] Unit with acute on chronic respiratory failure secondary to pulmonary edema and ?pneumonia requiring BIpap PCCM consulted for additional management   STUDIES:  None  HISTORY OF PRESENT ILLNESS:   This is a 55 yo male with a PMH of Schizophrenia, Renal artery stenosis, Malignant Hypertension, ESRD, Hemodialysis M-W-F, Diabetes Mellitus, and Anemia.  He presented to [facility] ER 12/26 with c/o severe shortness of breath per ER notes he was recently hospitalized and treated for hyperkalemia.  According to ER notes the pt missed hemodialysis 12/25 due to the holidays.  Upon arrival to ER he endorsed having chills and a frequent cough.  PCCM consulted 12/26 for additional management of acute on chronic hypoxic respiratory failure secondary to pulmonary edema and ?pneumonia requiring Bipap.  PAST MEDICAL HISTORY :   has a past medical history of Anemia; Diabetes mellitus without complication ([place]); Dialysis patient [facility]); ESRD (end stage renal disease) ([place]); Hyperlipidemia; Malignant hypertension; Renal artery stenosis ([place]); and Schizophrenia ([place]).  has a past surgical history that includes Repair fx left lower leg; Cardiac catheterization (N/A, 10/22/2014); Insertion of dialysis catheter (Right); AV fistula placement (Left, 12/26/2014); and Cardiac catheterization (N/A, 02/28/2015). Prior to Admission medications   Medication Sig Start Date End Date Taking? Authorizing Provider  acetaminophen (TYLENOL) 500 MG tablet Take 500 mg by mouth every 4 (four) hours as needed for mild pain.  Take 1 tablet every 4-6 hours as needed for pain.   Yes Historical Provider, MD  albuterol (PROAIR HFA) 108 (90 Base) MCG/ACT inhaler Inhale 1-2 puffs into the lungs every 6 (six) hours as needed for wheezing or shortness of breath (cough).   Yes Historical Provider, MD  b complex-vitamin c-folic acid (NEPHRO-VITE) 0.8 MG TABS tablet Take 1 tablet by mouth daily.   Yes Historical Provider, MD  baclofen (LIORESAL) 10 MG tablet Take 1 tablet (10 mg total) by mouth 2 (two) times daily as needed for muscle spasms. 05/19/15  Yes Fritzi Mandes, MD  carbamazepine (TEGRETOL XR) 200 MG 12 hr tablet Take 1,000 mg by mouth every morning.   Yes Historical Provider, MD  cyclobenzaprine (FLEXERIL) 5 MG tablet Take 5 mg by mouth at bedtime as needed for muscle spasms.   Yes Historical Provider, MD  fluPHENAZine decanoate (PROLIXIN) 25 MG/ML injection Inject 25 mg into the muscle every 14 (fourteen) days.   Yes Historical Provider, MD  fluticasone furoate-vilanterol (BREO ELLIPTA) 100-25 MCG/INH AEPB Inhale 1 puff into the lungs daily.   Yes Historical Provider, MD  Fluticasone-Salmeterol (ADVAIR DISKUS) 250-50 MCG/DOSE AEPB Inhale 1 puff into the lungs 2 (two) times daily.   Yes Historical Provider, MD  hydrALAZINE (APRESOLINE) 100 MG tablet Take 1 tablet (100 mg total) by mouth every 6 (six) hours. 05/19/15  Yes Fritzi Mandes, MD  traMADol (ULTRAM) 50 MG tablet Take 1 tablet (50 mg total) by mouth every 6 (six) hours as needed. 05/19/15  Yes Fritzi Mandes, MD  trihexyphenidyl (ARTANE) 5 MG tablet Take 5 mg by mouth 3 (three) times daily with meals.   Yes Historical Provider, MD  amLODipine (NORVASC)  10 MG tablet Take 10 mg by mouth daily.    Historical Provider, MD  atenolol (TENORMIN) 25 MG tablet Take 25 mg by mouth daily.    Historical Provider, MD  calcium acetate (PHOSLO) 667 MG capsule Take 2 capsules (1,334 mg total) by mouth 3 (three) times daily with meals. Patient not taking: Reported on 04/14/2016 05/19/15   Fritzi Mandes, MD  calcium acetate (PHOSLO) 667 MG capsule Take 1,334 mg by mouth 3 (three) times daily with meals.     Historical Provider, MD  carbamazepine (TEGRETOL) 200 MG tablet Take 2.5 tablets (500 mg total) by mouth 2 (two) times daily. Taken 2 1/2 tabs (500mg  ) twice a day Patient not taking: Reported on 04/14/2016 10/31/14   Gladstone Lighter, MD  cholecalciferol (VITAMIN D) 1000 UNITS tablet Take 1,000 Units by mouth daily.    Historical Provider, MD  citalopram (CELEXA) 20 MG tablet Take 1 tablet (20 mg total) by mouth daily. Patient not taking: Reported on 04/14/2016 05/19/15   Fritzi Mandes, MD  cloNIDine (CATAPRES) 0.2 MG tablet Take 1 tablet (0.2 mg total) by mouth 3 (three) times daily. 05/19/15   Fritzi Mandes, MD  ferrous sulfate 325 (65 FE) MG tablet Take 1 tablet (325 mg total) by mouth daily. Patient not taking: Reported on 05/26/2016 05/19/15   Fritzi Mandes, MD  fluPHENAZine (PROLIXIN) 2.5 MG tablet Take 25 mg by mouth daily.     Historical Provider, MD  minoxidil (LONITEN) 2.5 MG tablet Take 2 tablets (5 mg total) by mouth daily. 05/19/15   Fritzi Mandes, MD   Allergies  Allergen Reactions  . Thorazine [Chlorpromazine] Other (See Comments)    Reaction:  Unknown , pt states it makes him feel real bad    FAMILY HISTORY:  family history is not on file. SOCIAL HISTORY:  reports that he has been smoking Cigarettes.  He has been smoking about 0.50 packs per day. He has never used smokeless tobacco. He reports that he does not drink alcohol or use drugs.  REVIEW OF SYSTEMS:  Positives in BOLD Constitutional: fever, chills, weight loss, malaise/fatigue and diaphoresis.  HENT: Negative for hearing loss, ear pain, nosebleeds, congestion, sore throat, neck pain, tinnitus and ear discharge.   Eyes: Negative for blurred vision, double vision, photophobia, pain, discharge and redness.  Respiratory: cough, hemoptysis, sputum production, shortness of breath, wheezing and stridor.   Cardiovascular:  Negative for chest pain, palpitations, orthopnea, claudication, leg swelling and PND.  Gastrointestinal: heartburn, nausea, vomiting, abdominal pain, diarrhea, constipation, blood in stool and melena.  Genitourinary: Negative for dysuria, urgency, frequency, hematuria and flank pain.  Musculoskeletal: Negative for myalgias, back pain, joint pain and falls.  Skin: Negative for itching and rash.  Neurological: Negative for dizziness, tingling, tremors, sensory change, speech change, focal weakness, seizures, loss of consciousness, weakness and headaches.  Endo/Heme/Allergies: Negative for environmental allergies and polydipsia. Does not bruise/bleed easily.  SUBJECTIVE:  Pt currently resting in bed on 5L O2 via nasal canula post hemodialysis stating he feels better.  VITAL SIGNS: Temp:  [98.8 F (37.1 C)-102.9 F (39.4 C)] 98.8 F (37.1 C) (12/26 2157) Pulse Rate:  [72-89] 79 (12/26 2205) Resp:  [17-40] 24 (12/26 2205) BP: (135-194)/(80-120) 155/89 (12/26 2157) SpO2:  [89 %-100 %] 93 % (12/26 2205) Weight:  [163 lb 5.8 oz (74.1 kg)-170 lb (77.1 kg)] 163 lb 5.8 oz (74.1 kg) (12/26 2157)  PHYSICAL EXAMINATION: General:  Chronically ill appearing AA male, well developed, well nourished Neuro:  Alert and oriented,  follows commands, PERRLA HEENT:  Supple, no JVD Cardiovascular:  NSR, s1s2, no M/R/G Lungs:  Fine crackles bilateral bases diminished all other lobes, even, non labored Abdomen:  +BS x4, soft, non tender, non distended Musculoskeletal:  Normal bulk, 1+ bilateral lower extremity edema Skin:  Intact no rashes or lesion dry scaly   Recent Labs Lab 05/26/16 1241 05/26/16 1830  NA 134* 130*  K 5.4* 5.5*  CL 94* 93*  CO2 24 24  BUN 48* 54*  CREATININE 7.13* 7.47*  GLUCOSE 109* 115*    Recent Labs Lab 05/26/16 1241 05/26/16 1830  HGB 12.2* 11.8*  HCT 35.6* 33.7*  WBC 12.7* 11.7*  PLT 181 171   Dg Chest Port 1 View  Result Date: 05/26/2016 CLINICAL DATA:   Admitted on 05/26/2016 with pneumonia, sepsis, history of anemia, diabetes, ESRD, hypertension EXAM: PORTABLE CHEST 1 VIEW COMPARISON:  05/26/2016 FINDINGS: Mild-to-moderate enlargement of the cardiopericardial silhouette. Stable mild interstitial accentuation in both lungs. No airspace opacity identified; vague density in the left mid lung is ascribed to the extension of the ECG lead. No blunting of the costophrenic angles. IMPRESSION: 1. Stable interstitial accentuation in the lungs accompanied by cardiomegaly, this probably reflects mild interstitial edema. Electronically Signed   By: Van Clines M.D.   On: 05/26/2016 18:26   Dg Chest Port 1 View  Result Date: 05/26/2016 CLINICAL DATA:  Shortness breath, cough for 2 days, fever EXAM: PORTABLE CHEST 1 VIEW COMPARISON:  Chest x-ray of 05/16/2015 FINDINGS: There is moderate cardiomegaly present. There are somewhat prominent interstitial markings suggesting mild interstitial edema. No definite effusion is seen. The bony thorax appears intact. IMPRESSION: Cardiomegaly and probable mild interstitial edema. Electronically Signed   By: Ivar Drape M.D.   On: 05/26/2016 13:05    ASSESSMENT / PLAN: Acute on chronic hypoxic respiratory failure secondary to pulmonary edema and ?pneumonia  Mild leukocytosis Mildly elevated troponin's likely secondary to demand ischemia  Lactic acidosis-resolved Hx: Hemodialysis M-W-F P: Prn Bipap  Supplemental O2 to maintain O2 sats >92% Nephrology consulted hemodialysis per recommendations Echo pending  Continue Azithromycin, Cefepime, and Vancomycin Trend WBC's and monitor fever curve Trend PCT's Continue bronchodilators  Follow cultures   Marda Stalker, [place] Pager [phone] (please enter 7 digits) PCCM Consult Pager [phone] (please enter 7 digits)   PCCM ATTENDING ATTESTATION:  I have evaluated patient with the APP [place], reviewed database in its entirety and  discussed care plan in detail. In addition, this patient was discussed on multidisciplinary rounds.   He is now much improved after HD and comfortable on [place] O2. He can safely move to Telemetry floor. Discussed with Dr Juleen China and Dr Manuella Ghazi. He was febrile with elevated CPT and with predominantly respiratory symptoms. His CXR is not very impressive for bacterial PNA and shows cardiomegaly and interstitial prominence that has the appearance of edema. Would continue current abx and follow up culture results  PCCM will sign off. Please call if we can be of further assistance   Merton Border, MD PCCM service Mobile [phone] Pager [phone]  05/27/2016

## 2016-05-26 NOTE — Progress Notes (Signed)
HD initiated without issue via L AVF. Labs sent. No heparin tx. Pt SOB/coughing. Continue to monitor

## 2016-05-26 NOTE — Progress Notes (Signed)
Pharmacy Antibiotic Note  Todd Brown is a 56 y.o. male admitted on 05/26/2016 with pneumonia.  Pharmacy has been consulted for ceftriaxone and azithromycin dosing.  Plan: Azithromycin 500 mg IV daily Ceftriaxone 1 g IV daily  Height: 6\' 3"  (190.5 cm) Weight: 170 lb (77.1 kg) IBW/kg (Calculated) : 84.5  Temp (24hrs), Avg:102.9 F (39.4 C), Min:102.9 F (39.4 C), Max:102.9 F (39.4 C)   Recent Labs Lab 05/26/16 1241  WBC 12.7*  CREATININE 7.13*  LATICACIDVEN 2.4*    Estimated Creatinine Clearance: 12.8 mL/min (by C-G formula based on SCr of 7.13 mg/dL (H)).    Allergies  Allergen Reactions  . Thorazine [Chlorpromazine] Other (See Comments)    Reaction:  Unknown , pt states it makes him feel real bad    Antimicrobials this admission: ceftriaxone 12/26 >>  azithromycin 12/26 >>  Cefepime one dose in ED 12/26  Dose adjustments this admission:  Microbiology results: 12/26 BCx: Sent  Thank you for allowing pharmacy to be a part of this patient's care.  Lenis Noon, PharmD Clinical Pharmacist 05/26/2016 3:18 PM

## 2016-05-26 NOTE — Progress Notes (Signed)
Call from lab, patient PCR MRSA (+), protocol entered.

## 2016-05-26 NOTE — Progress Notes (Signed)
Hemodialysis- Treatment completed without issue. 2.5L UF as ordered. Breathing improved, however still requiring 4-5L 02. Report given to primary RN.

## 2016-05-27 ENCOUNTER — Inpatient Hospital Stay: Payer: Medicaid Other

## 2016-05-27 ENCOUNTER — Inpatient Hospital Stay (HOSPITAL_COMMUNITY)
Admit: 2016-05-27 | Discharge: 2016-05-27 | Disposition: A | Discharge status: Home / Self Care | Payer: Medicaid Other | Attending: Internal Medicine | Admitting: Internal Medicine

## 2016-05-27 DIAGNOSIS — J9601 Acute respiratory failure with hypoxia: Secondary | ICD-10-CM

## 2016-05-27 DIAGNOSIS — R0600 Dyspnea, unspecified: Secondary | ICD-10-CM

## 2016-05-27 LAB — BASIC METABOLIC PANEL
ANION GAP: 10 (ref 5–15)
BUN: 30 mg/dL — AB (ref 6–20)
CALCIUM: 8.7 mg/dL — AB (ref 8.9–10.3)
CO2: 29 mmol/L (ref 22–32)
Chloride: 97 mmol/L — ABNORMAL LOW (ref 101–111)
Creatinine, Ser: 4.97 mg/dL — ABNORMAL HIGH (ref 0.61–1.24)
GFR calc Af Amer: 14 mL/min — ABNORMAL LOW (ref >60)
GFR, EST NON AFRICAN AMERICAN: 12 mL/min — AB (ref >60)
GLUCOSE: 99 mg/dL (ref 65–99)
POTASSIUM: 4 mmol/L (ref 3.5–5.1)
SODIUM: 136 mmol/L (ref 135–145)

## 2016-05-27 LAB — TROPONIN I: Troponin I: 0.09 ng/mL

## 2016-05-27 LAB — CBC
HEMATOCRIT: 31.2 % — AB (ref 40.0–52.0)
Hemoglobin: 10.9 g/dL — ABNORMAL LOW (ref 13.0–18.0)
MCH: 35.6 pg — ABNORMAL HIGH (ref 26.0–34.0)
MCHC: 34.8 g/dL (ref 32.0–36.0)
MCV: 102.1 fL — ABNORMAL HIGH (ref 80.0–100.0)
Platelets: 132 10*3/uL — ABNORMAL LOW (ref 150–440)
RBC: 3.05 MIL/uL — ABNORMAL LOW (ref 4.40–5.90)
RDW: 14 % (ref 11.5–14.5)
WBC: 9.3 10*3/uL (ref 3.8–10.6)

## 2016-05-27 LAB — ECHOCARDIOGRAM COMPLETE
Height: 75 in
WEIGHTICAEL: 2596.14 [oz_av]

## 2016-05-27 LAB — HEPATITIS B SURFACE ANTIGEN: HEP B S AG: NEGATIVE

## 2016-05-27 LAB — HEPATITIS B SURFACE ANTIBODY,QUALITATIVE: Hep B S Ab: REACTIVE

## 2016-05-27 LAB — HEPATITIS B CORE ANTIBODY, TOTAL: HEP B C TOTAL AB: NEGATIVE

## 2016-05-27 LAB — GLUCOSE, CAPILLARY: Glucose-Capillary: 107 mg/dL — ABNORMAL HIGH (ref 65–99)

## 2016-05-27 MED ORDER — ALBUTEROL SULFATE (2.5 MG/3ML) 0.083% IN NEBU
2.5000 mg | INHALATION_SOLUTION | Freq: Four times a day (QID) | RESPIRATORY_TRACT | Status: DC
  Administered 2016-05-27: 2.5 mg via RESPIRATORY_TRACT
  Filled 2016-05-27: qty 3

## 2016-05-27 MED ORDER — ORAL CARE MOUTH RINSE
15.0000 mL | Freq: Two times a day (BID) | OROMUCOSAL | Status: DC
  Administered 2016-05-27 – 2016-05-31 (×7): 15 mL via OROMUCOSAL

## 2016-05-27 MED ORDER — ALBUTEROL SULFATE (2.5 MG/3ML) 0.083% IN NEBU
2.5000 mg | INHALATION_SOLUTION | RESPIRATORY_TRACT | Status: DC | PRN
  Administered 2016-05-28 – 2016-05-29 (×2): 2.5 mg via RESPIRATORY_TRACT
  Filled 2016-05-27 (×2): qty 3

## 2016-05-27 MED ORDER — IPRATROPIUM BROMIDE 0.02 % IN SOLN
0.5000 mg | Freq: Four times a day (QID) | RESPIRATORY_TRACT | Status: DC
  Administered 2016-05-27 – 2016-05-29 (×7): 0.5 mg via RESPIRATORY_TRACT
  Filled 2016-05-27 (×8): qty 2.5

## 2016-05-27 MED ORDER — AZITHROMYCIN 250 MG PO TABS
500.0000 mg | ORAL_TABLET | ORAL | Status: AC
  Administered 2016-05-27 – 2016-05-30 (×3): 500 mg via ORAL
  Filled 2016-05-27 (×4): qty 2

## 2016-05-27 NOTE — Care Management Note (Signed)
Case Management Note  Patient Details  Name: Todd Brown MRN: [id number] Date of Birth: May 27, 1961  Subjective/Objective:                  Patient lives alone in [place]. "He's with the A team" per mother. They do everything for him. Mother states she called him and he was out of breath. She told him to call 911 and he did. He is not on O2 home. She does not think he makes any urine and that he goes to dialysis but doesn't recall name of facility. He walks okay with a cane sometimes. His mother lives in [place]. She states that he makes his own health decisions. She would like to be updated with any changes.   Action/Plan:  RNCM will continue to follow.   Expected Discharge Date:                  Expected Discharge Plan:     In-House Referral:     Discharge planning Services     Post Acute Care Choice:    Choice offered to:     DME Arranged:    DME Agency:     HH Arranged:    HH Agency:     Status of Service:     If discussed at [employer], dates discussed:    Additional Comments:  Marshell Garfinkel, RN 05/27/2016, 9:16 AM

## 2016-05-27 NOTE — Progress Notes (Signed)
Pre Dialysis 

## 2016-05-27 NOTE — Progress Notes (Signed)
Dialysis system clotted. Blood rinsed back 20 minutes early. Tx terminated

## 2016-05-27 NOTE — Progress Notes (Signed)
Post dialysis 

## 2016-05-27 NOTE — Progress Notes (Signed)
Patient moved to 226 via bed with oxygen at 3 liters nasal cannula.

## 2016-05-27 NOTE — Progress Notes (Signed)
*  PRELIMINARY RESULTS* Echocardiogram 2D Echocardiogram has been performed.  Todd Brown 05/27/2016, 9:22 AM

## 2016-05-27 NOTE — Progress Notes (Signed)
CONCERNING: Antibiotic IV to Oral Route Change Policy  RECOMMENDATION: This patient is receiving azithromycin by the intravenous route.  Based on criteria approved by the Pharmacy and Therapeutics Committee, the antibiotic(s) is/are being converted to the equivalent oral dose form(s).   DESCRIPTION: These criteria include:  Patient being treated for a respiratory tract infection, urinary tract infection, cellulitis or clostridium difficile associated diarrhea if on metronidazole  The patient is not neutropenic and does not exhibit a GI malabsorption state  The patient is eating (either orally or via tube) and/or has been taking other orally administered medications for a least 24 hours  The patient is improving clinically and has a Tmax < 100.5  If you have questions about this conversion, please contact the Pharmacy Department  []   [phone] )  [facility] [x]   [phone] )  [facility] []   [phone] )  [facility] []   [phone] )  [facility] []   [phone] )  [facility]   MLS  05/27/2016

## 2016-05-27 NOTE — Progress Notes (Signed)
Dialysis started 

## 2016-05-27 NOTE — Progress Notes (Signed)
[facility] [place] NAME: Todd Brown    MR#:  [id number]  DATE OF BIRTH:  1961-05-08  SUBJECTIVE:  CHIEF COMPLAINT:   Chief Complaint  Patient presents with  . Shortness of Breath  . Cough  feeling better. dialyzed last night. Wants to pee REVIEW OF SYSTEMS:  Review of Systems  Constitutional: Negative for chills, fever and weight loss.  HENT: Negative for nosebleeds and sore throat.   Eyes: Negative for blurred vision.  Respiratory: Positive for shortness of breath. Negative for cough and wheezing.   Cardiovascular: Negative for chest pain, orthopnea, leg swelling and PND.  Gastrointestinal: Negative for abdominal pain, constipation, diarrhea, heartburn, nausea and vomiting.  Genitourinary: Negative for dysuria and urgency.  Musculoskeletal: Negative for back pain.  Skin: Negative for rash.  Neurological: Negative for dizziness, speech change, focal weakness and headaches.  Endo/Heme/Allergies: Does not bruise/bleed easily.  Psychiatric/Behavioral: Negative for depression.   DRUG ALLERGIES:   Allergies  Allergen Reactions  . Thorazine [Chlorpromazine] Other (See Comments)    Reaction:  Unknown , pt states it makes him feel real bad   VITALS:  Blood pressure (!) 145/84, pulse 73, temperature 99.8 F (37.7 C), resp. rate 15, height 6\' 3"  (1.905 m), weight 73.6 kg (162 lb 4.1 oz), SpO2 94 %. PHYSICAL EXAMINATION:  Physical Exam  Constitutional: He is oriented to person, place, and time and well-developed, well-nourished, and in no distress.  HENT:  Head: Normocephalic and atraumatic.  Eyes: Conjunctivae and EOM are normal. Pupils are equal, round, and reactive to light.  Neck: Normal range of motion. Neck supple. No tracheal deviation present. No thyromegaly present.  Cardiovascular: Normal rate, regular rhythm and normal heart sounds.   Pulmonary/Chest: Effort normal and breath sounds normal. No respiratory distress. He  has no wheezes. He exhibits no tenderness.  Abdominal: Soft. Bowel sounds are normal. He exhibits no distension. There is no tenderness.  Musculoskeletal: Normal range of motion.  Lt arm AV fistula  Neurological: He is alert and oriented to person, place, and time. No cranial nerve deficit.  Skin: Skin is warm and dry. No rash noted.  Psychiatric: Mood and affect normal.   LABORATORY PANEL:   CBC  Recent Labs Lab 05/27/16 0119  WBC 9.3  HGB 10.9*  HCT 31.2*  PLT 132*   ------------------------------------------------------------------------------------------------------------------ Chemistries   Recent Labs Lab 05/26/16 1830 05/27/16 0119  NA 130* 136  K 5.5* 4.0  CL 93* 97*  CO2 24 29  GLUCOSE 115* 99  BUN 54* 30*  CREATININE 7.47* 4.97*  CALCIUM 8.9 8.7*  AST 65*  --   ALT 49  --   ALKPHOS 106  --   BILITOT 0.9  --    RADIOLOGY:  Dg Chest Port 1 View  Result Date: 05/27/2016 CLINICAL DATA:  Respiratory failure. EXAM: PORTABLE CHEST 1 VIEW COMPARISON:  05/26/2016. FINDINGS: Cardiomegaly with diffuse bilateral pulmonary interstitial prominence. Findings consistent CHF. Pneumonitis cannot be excluded. No pleural effusion or pneumothorax. IMPRESSION: Congestive heart failure with pulmonary interstitial edema. Electronically Signed   By: Marcello Moores  Register   On: 05/27/2016 07:19   Dg Chest Port 1 View  Result Date: 05/26/2016 CLINICAL DATA:  Admitted on 05/26/2016 with pneumonia, sepsis, history of anemia, diabetes, ESRD, hypertension EXAM: PORTABLE CHEST 1 VIEW COMPARISON:  05/26/2016 FINDINGS: Mild-to-moderate enlargement of the cardiopericardial silhouette. Stable mild interstitial accentuation in both lungs. No airspace opacity identified; vague density in the left mid lung is ascribed to  the extension of the ECG lead. No blunting of the costophrenic angles. IMPRESSION: 1. Stable interstitial accentuation in the lungs accompanied by cardiomegaly, this probably reflects  mild interstitial edema. Electronically Signed   By: Van Clines M.D.   On: 05/26/2016 18:26   Dg Chest Port 1 View  Result Date: 05/26/2016 CLINICAL DATA:  Shortness breath, cough for 2 days, fever EXAM: PORTABLE CHEST 1 VIEW COMPARISON:  Chest x-ray of 05/16/2015 FINDINGS: There is moderate cardiomegaly present. There are somewhat prominent interstitial markings suggesting mild interstitial edema. No definite effusion is seen. The bony thorax appears intact. IMPRESSION: Cardiomegaly and probable mild interstitial edema. Electronically Signed   By: Ivar Drape M.D.   On: 05/26/2016 13:05   ASSESSMENT AND PLAN:  55 year old male being admitted for sepsis  * Sepsis: Present on admission. The likely source being lung  * Acute on chronic respiratory failure  - Likely due to fluid overload from missing dialysis session and pneumonia - Continue antibiotics for now. Procalcitonin high  * Elevated troponin - due to supply demand ischemia from sepsis - Await echo  * Hypertension - Continue home medication and adjust as needed  * End-stage renal disease on hemodialysis - s/p urgent hemodialysis in ICU yesterday  * Hyperkalemia - resolved with hemodialysis  Can transfer to any med-surg with off uni tele   All the records are reviewed and case discussed with Care Management/Social Worker. Management plans discussed with the patient, family and they are in agreement.  CODE STATUS: FULL CODE  TOTAL TIME TAKING CARE OF THIS PATIENT: 35 minutes.   More than 50% of the time was spent in counseling/coordination of care: YES  POSSIBLE D/C IN 1-2 DAYS, DEPENDING ON CLINICAL CONDITION.   Max Sane M.D on 05/27/2016 at 8:23 AM  Between 7am to 6pm - Pager - [phone]  After 6pm go to [email or web address] - Technical brewer [facility]  Office  [phone]  CC: Primary care physician; No PCP Per Patient  Note: This dictation was prepared with  Dragon dictation along with smaller phrase technology. Any transcriptional errors that result from this process are unintentional.

## 2016-05-27 NOTE — Progress Notes (Signed)
[facility]  ROUNDING NOTE   Subjective:   Hemodialysis yesterday for fluid overload and pulmonary edema. UF of 2.5 litres.   Plan on dialysis again today.   Objective:  Vital signs in last 24 hours:  Temp:  [98.8 F (37.1 C)-102.9 F (39.4 C)] 99.8 F (37.7 C) (12/27 0700) Pulse Rate:  [68-89] 78 (12/27 1000) Resp:  [15-40] 25 (12/27 1000) BP: (114-194)/(65-120) 118/73 (12/27 1000) SpO2:  [89 %-100 %] 94 % (12/27 1000) Weight:  [73.6 kg (162 lb 4.1 oz)-77.1 kg (170 lb)] 73.6 kg (162 lb 4.1 oz) (12/27 0500)  Weight change:  Filed Weights   05/26/16 1808 05/26/16 2157 05/27/16 0500  Weight: 76.7 kg (169 lb 1.5 oz) 74.1 kg (163 lb 5.8 oz) 73.6 kg (162 lb 4.1 oz)    Intake/Output: I/O last 3 completed shifts: In: 60 [IV Piggyback:50] Out: 2501 [Other:2500; Stool:1]   Intake/Output this shift:  Total I/O In: 450 [P.O.:450] Out: -   Physical Exam: General: NAD  Head: Normocephalic, atraumatic. Moist oral mucosal membranes  Eyes: Anicteric, PERRL  Neck: Supple, trachea midline  Lungs:  Bilateral crackles, rhonchi  Heart: Regular rate and rhythm  Abdomen:  Soft, nontender,   Extremities: No peripheral edema.  Neurologic: Nonfocal, moving all four extremities  Skin: No lesions  Access: Left arm AVF    Basic Metabolic Panel:  Recent Labs Lab 05/26/16 1241 05/26/16 1830 05/27/16 0119  NA 134* 130* 136  K 5.4* 5.5* 4.0  CL 94* 93* 97*  CO2 24 24 29   GLUCOSE 109* 115* 99  BUN 48* 54* 30*  CREATININE 7.13* 7.47* 4.97*  CALCIUM 9.4 8.9 8.7*    Liver Function Tests:  Recent Labs Lab 05/26/16 1241 05/26/16 1830  AST 81* 65*  ALT 55 49  ALKPHOS 124 106  BILITOT 0.5 0.9  PROT 7.7 6.9  ALBUMIN 3.9 3.5   No results for input(s): LIPASE, AMYLASE in the last 168 hours. No results for input(s): AMMONIA in the last 168 hours.  CBC:  Recent Labs Lab 05/26/16 1241 05/26/16 1830 05/27/16 0119  WBC 12.7* 11.7* 9.3  NEUTROABS 11.7* 10.0*   --   HGB 12.2* 11.8* 10.9*  HCT 35.6* 33.7* 31.2*  MCV 103.9* 103.7* 102.1*  PLT 181 171 132*    Cardiac Enzymes:  Recent Labs Lab 05/26/16 1241 05/26/16 1830 05/26/16 2142 05/27/16 0119  TROPONINI 0.07* 0.09* 0.10* 0.09*    BNP: Invalid input(s): POCBNP  CBG: No results for input(s): GLUCAP in the last 168 hours.  Microbiology: Results for orders placed or performed during the hospital encounter of 05/26/16  Blood culture (routine x 2)     Status: None (Preliminary result)   Collection Time: 05/26/16 12:41 PM  Result Value Ref Range Status   Specimen Description BLOOD RIGHT AC  Final   Special Requests   Final    BOTTLES DRAWN AEROBIC AND ANAEROBIC AER 10ML ANA 17ML   Culture NO GROWTH < 24 HOURS  Final   Report Status PENDING  Incomplete  Blood culture (routine x 2)     Status: None (Preliminary result)   Collection Time: 05/26/16 12:41 PM  Result Value Ref Range Status   Specimen Description BLOOD RIGHT AC  Final   Special Requests   Final    BOTTLES DRAWN AEROBIC AND ANAEROBIC AER 14ML ANA 8ML   Culture NO GROWTH < 24 HOURS  Final   Report Status PENDING  Incomplete  MRSA PCR Screening     Status: Abnormal  Collection Time: 05/26/16  5:23 PM  Result Value Ref Range Status   MRSA by PCR POSITIVE (A) NEGATIVE Final    Comment:        The GeneXpert MRSA Assay (FDA approved for NASAL specimens only), is one component of a comprehensive MRSA colonization surveillance program. It is not intended to diagnose MRSA infection nor to guide or monitor treatment for MRSA infections. RESULT CALLED TO, READ BACK BY AND VERIFIED WITH: PAM CRAWFORD @1912  ON 05/26/2016 JLJ     Coagulation Studies:  Recent Labs  05/26/16 1830  LABPROT 14.7  INR 1.14    Urinalysis: No results for input(s): COLORURINE, LABSPEC, PHURINE, GLUCOSEU, HGBUR, BILIRUBINUR, KETONESUR, PROTEINUR, UROBILINOGEN, NITRITE, LEUKOCYTESUR in the last 72 hours.  Invalid input(s): APPERANCEUR     Imaging: Dg Chest Port 1 View  Result Date: 05/27/2016 CLINICAL DATA:  Respiratory failure. EXAM: PORTABLE CHEST 1 VIEW COMPARISON:  05/26/2016. FINDINGS: Cardiomegaly with diffuse bilateral pulmonary interstitial prominence. Findings consistent CHF. Pneumonitis cannot be excluded. No pleural effusion or pneumothorax. IMPRESSION: Congestive heart failure with pulmonary interstitial edema. Electronically Signed   By: Marcello Moores  Register   On: 05/27/2016 07:19   Dg Chest Port 1 View  Result Date: 05/26/2016 CLINICAL DATA:  Admitted on 05/26/2016 with pneumonia, sepsis, history of anemia, diabetes, ESRD, hypertension EXAM: PORTABLE CHEST 1 VIEW COMPARISON:  05/26/2016 FINDINGS: Mild-to-moderate enlargement of the cardiopericardial silhouette. Stable mild interstitial accentuation in both lungs. No airspace opacity identified; vague density in the left mid lung is ascribed to the extension of the ECG lead. No blunting of the costophrenic angles. IMPRESSION: 1. Stable interstitial accentuation in the lungs accompanied by cardiomegaly, this probably reflects mild interstitial edema. Electronically Signed   By: Van Clines M.D.   On: 05/26/2016 18:26   Dg Chest Port 1 View  Result Date: 05/26/2016 CLINICAL DATA:  Shortness breath, cough for 2 days, fever EXAM: PORTABLE CHEST 1 VIEW COMPARISON:  Chest x-ray of 05/16/2015 FINDINGS: There is moderate cardiomegaly present. There are somewhat prominent interstitial markings suggesting mild interstitial edema. No definite effusion is seen. The bony thorax appears intact. IMPRESSION: Cardiomegaly and probable mild interstitial edema. Electronically Signed   By: Ivar Drape M.D.   On: 05/26/2016 13:05     Medications:    . amLODipine  10 mg Oral Daily  . atenolol  25 mg Oral Daily  . azithromycin  500 mg Oral Q24H  . calcium acetate  1,334 mg Oral TID WC  . carbamazepine  1,000 mg Oral q morning - 10a  . cefTRIAXone  1 g Intravenous q1800  .  Chlorhexidine Gluconate Cloth  6 each Topical Q0600  . cholecalciferol  1,000 Units Oral Daily  . cloNIDine  0.2 mg Oral TID  . docusate sodium  100 mg Oral BID  . ferrous sulfate  325 mg Oral Daily  . fluPHENAZine  25 mg Oral Daily  . fluticasone furoate-vilanterol  1 puff Inhalation Daily  . heparin  5,000 Units Subcutaneous Q8H  . hydrALAZINE  100 mg Oral Q6H  . ipratropium  0.5 mg Nebulization Q6H  . mouth rinse  15 mL Mouth Rinse BID  . minoxidil  5 mg Oral Daily  . mometasone-formoterol  2 puff Inhalation BID  . mupirocin ointment  1 application Nasal BID  . sodium chloride flush  3 mL Intravenous Q12H  . trihexyphenidyl  5 mg Oral TID WC   acetaminophen **OR** [DISCONTINUED] acetaminophen, albuterol, baclofen, bisacodyl, cyclobenzaprine, HYDROcodone-acetaminophen, ondansetron **OR** ondansetron (ZOFRAN) IV  Assessment/  Plan:  [name] is a 55 y.o. black male with schizophrenia, hypertension, hepatitis C, tobacco abuse, ESRD, anemia of CKD, SHPTH   [facility] MWF [place].   1. End stage Renal Disease with hyperkalemia: emergent hemodialysis yesterday.  Dialysis for today.  Resume MWF schedule  2. Hypertension: improved  - minoxidil, amlodipine, atenolol, clonidine, hydralazine  3. Anemia of chronic kidney disease: hemoglobin at goal. EPO as outpatient.   4. Secondary Hyperparathyroidism: Outpatient PTH, calcium and phosphorus is at goal - calcium acetate with meals    LOS: [place], [place] 12/27/201710:29 AM

## 2016-05-28 LAB — EXPECTORATED SPUTUM ASSESSMENT W REFEX TO RESP CULTURE

## 2016-05-28 LAB — EXPECTORATED SPUTUM ASSESSMENT W GRAM STAIN, RFLX TO RESP C

## 2016-05-28 LAB — GLUCOSE, CAPILLARY: GLUCOSE-CAPILLARY: 108 mg/dL — AB (ref 65–99)

## 2016-05-28 MED ORDER — BENZONATATE 100 MG PO CAPS
200.0000 mg | ORAL_CAPSULE | Freq: Two times a day (BID) | ORAL | Status: DC | PRN
  Administered 2016-05-28: 200 mg via ORAL
  Filled 2016-05-28: qty 2

## 2016-05-28 MED ORDER — GUAIFENESIN-DM 100-10 MG/5ML PO SYRP
5.0000 mL | ORAL_SOLUTION | ORAL | Status: DC | PRN
  Administered 2016-05-28 – 2016-05-31 (×4): 5 mL via ORAL
  Filled 2016-05-28 (×4): qty 5

## 2016-05-28 NOTE — Progress Notes (Signed)
[facility]  ROUNDING NOTE   Subjective:   Hemodialysis treatment for last two days. Tolerated treatments well. UF total of 5 litres  Continues to complain of nausea and coughing  Objective:  Vital signs in last 24 hours:  Temp:  [98.2 F (36.8 C)-99.5 F (37.5 C)] 98.8 F (37.1 C) (12/28 0557) Pulse Rate:  [60-88] 83 (12/28 0901) Resp:  [15-24] 21 (12/28 0901) BP: (87-135)/(53-86) 132/77 (12/28 0901) SpO2:  [90 %-100 %] 93 % (12/28 0901) Weight:  [68.7 kg (151 lb 7.3 oz)-72.3 kg (159 lb 6.3 oz)] 68.7 kg (151 lb 7.3 oz) (12/28 0500)  Weight change: -4.812 kg (-10 lb 9.7 oz) Filed Weights   05/27/16 0500 05/27/16 1813 05/28/16 0500  Weight: 73.6 kg (162 lb 4.1 oz) 72.3 kg (159 lb 6.3 oz) 68.7 kg (151 lb 7.3 oz)    Intake/Output: I/O last 3 completed shifts: In: 401 [P.O.:690; I.V.:3; IV Piggyback:50] Out: 0272 [Urine:550; ZDGUY:4034; Stool:1]   Intake/Output this shift:  No intake/output data recorded.  Physical Exam: General: NAD  Head: Normocephalic, atraumatic. Moist oral mucosal membranes  Eyes: Anicteric, PERRL  Neck: Supple, trachea midline  Lungs:  Bilateral crackles, rhonchi  Heart: Regular rate and rhythm  Abdomen:  Soft, nontender,   Extremities: No peripheral edema.  Neurologic: Nonfocal, moving all four extremities  Skin: No lesions  Access: Left arm AVF    Basic Metabolic Panel:  Recent Labs Lab 05/26/16 1241 05/26/16 1830 05/27/16 0119  NA 134* 130* 136  K 5.4* 5.5* 4.0  CL 94* 93* 97*  CO2 24 24 29   GLUCOSE 109* 115* 99  BUN 48* 54* 30*  CREATININE 7.13* 7.47* 4.97*  CALCIUM 9.4 8.9 8.7*    Liver Function Tests:  Recent Labs Lab 05/26/16 1241 05/26/16 1830  AST 81* 65*  ALT 55 49  ALKPHOS 124 106  BILITOT 0.5 0.9  PROT 7.7 6.9  ALBUMIN 3.9 3.5   No results for input(s): LIPASE, AMYLASE in the last 168 hours. No results for input(s): AMMONIA in the last 168 hours.  CBC:  Recent Labs Lab 05/26/16 1241  05/26/16 1830 05/27/16 0119  WBC 12.7* 11.7* 9.3  NEUTROABS 11.7* 10.0*  --   HGB 12.2* 11.8* 10.9*  HCT 35.6* 33.7* 31.2*  MCV 103.9* 103.7* 102.1*  PLT 181 171 132*    Cardiac Enzymes:  Recent Labs Lab 05/26/16 1241 05/26/16 1830 05/26/16 2142 05/27/16 0119  TROPONINI 0.07* 0.09* 0.10* 0.09*    BNP: Invalid input(s): POCBNP  CBG:  Recent Labs Lab 05/26/16 1716 05/28/16 0755  GLUCAP 107* 108*    Microbiology: Results for orders placed or performed during the hospital encounter of 05/26/16  Blood culture (routine x 2)     Status: None (Preliminary result)   Collection Time: 05/26/16 12:41 PM  Result Value Ref Range Status   Specimen Description BLOOD RIGHT AC  Final   Special Requests   Final    BOTTLES DRAWN AEROBIC AND ANAEROBIC AER 10ML ANA 17ML   Culture NO GROWTH 2 DAYS  Final   Report Status PENDING  Incomplete  Blood culture (routine x 2)     Status: None (Preliminary result)   Collection Time: 05/26/16 12:41 PM  Result Value Ref Range Status   Specimen Description BLOOD RIGHT AC  Final   Special Requests   Final    BOTTLES DRAWN AEROBIC AND ANAEROBIC AER 14ML ANA 8ML   Culture NO GROWTH 2 DAYS  Final   Report Status PENDING  Incomplete  MRSA PCR Screening     Status: Abnormal   Collection Time: 05/26/16  5:23 PM  Result Value Ref Range Status   MRSA by PCR POSITIVE (A) NEGATIVE Final    Comment:        The GeneXpert MRSA Assay (FDA approved for NASAL specimens only), is one component of a comprehensive MRSA colonization surveillance program. It is not intended to diagnose MRSA infection nor to guide or monitor treatment for MRSA infections. RESULT CALLED TO, READ BACK BY AND VERIFIED WITH: PAM CRAWFORD @1912  ON 05/26/2016 JLJ   Culture, sputum-assessment     Status: None   Collection Time: 05/28/16  7:12 AM  Result Value Ref Range Status   Specimen Description SPUTUM  Final   Special Requests NONE  Final   Sputum evaluation THIS  SPECIMEN IS ACCEPTABLE FOR SPUTUM CULTURE  Final   Report Status 05/28/2016 FINAL  Final    Coagulation Studies:  Recent Labs  05/26/16 1830  LABPROT 14.7  INR 1.14    Urinalysis: No results for input(s): COLORURINE, LABSPEC, PHURINE, GLUCOSEU, HGBUR, BILIRUBINUR, KETONESUR, PROTEINUR, UROBILINOGEN, NITRITE, LEUKOCYTESUR in the last 72 hours.  Invalid input(s): APPERANCEUR    Imaging: Dg Chest Port 1 View  Result Date: 05/27/2016 CLINICAL DATA:  Respiratory failure. EXAM: PORTABLE CHEST 1 VIEW COMPARISON:  05/26/2016. FINDINGS: Cardiomegaly with diffuse bilateral pulmonary interstitial prominence. Findings consistent CHF. Pneumonitis cannot be excluded. No pleural effusion or pneumothorax. IMPRESSION: Congestive heart failure with pulmonary interstitial edema. Electronically Signed   By: Marcello Moores  Register   On: 05/27/2016 07:19   Dg Chest Port 1 View  Result Date: 05/26/2016 CLINICAL DATA:  Admitted on 05/26/2016 with pneumonia, sepsis, history of anemia, diabetes, ESRD, hypertension EXAM: PORTABLE CHEST 1 VIEW COMPARISON:  05/26/2016 FINDINGS: Mild-to-moderate enlargement of the cardiopericardial silhouette. Stable mild interstitial accentuation in both lungs. No airspace opacity identified; vague density in the left mid lung is ascribed to the extension of the ECG lead. No blunting of the costophrenic angles. IMPRESSION: 1. Stable interstitial accentuation in the lungs accompanied by cardiomegaly, this probably reflects mild interstitial edema. Electronically Signed   By: Van Clines M.D.   On: 05/26/2016 18:26   Dg Chest Port 1 View  Result Date: 05/26/2016 CLINICAL DATA:  Shortness breath, cough for 2 days, fever EXAM: PORTABLE CHEST 1 VIEW COMPARISON:  Chest x-ray of 05/16/2015 FINDINGS: There is moderate cardiomegaly present. There are somewhat prominent interstitial markings suggesting mild interstitial edema. No definite effusion is seen. The bony thorax appears intact.  IMPRESSION: Cardiomegaly and probable mild interstitial edema. Electronically Signed   By: Ivar Drape M.D.   On: 05/26/2016 13:05     Medications:    . amLODipine  10 mg Oral Daily  . atenolol  25 mg Oral Daily  . azithromycin  500 mg Oral Q24H  . calcium acetate  1,334 mg Oral TID WC  . carbamazepine  1,000 mg Oral q morning - 10a  . cefTRIAXone  1 g Intravenous q1800  . Chlorhexidine Gluconate Cloth  6 each Topical Q0600  . cholecalciferol  1,000 Units Oral Daily  . cloNIDine  0.2 mg Oral TID  . docusate sodium  100 mg Oral BID  . ferrous sulfate  325 mg Oral Daily  . fluPHENAZine  25 mg Oral Daily  . fluticasone furoate-vilanterol  1 puff Inhalation Daily  . heparin  5,000 Units Subcutaneous Q8H  . hydrALAZINE  100 mg Oral Q6H  . ipratropium  0.5 mg Nebulization Q6H  . mouth  rinse  15 mL Mouth Rinse BID  . minoxidil  5 mg Oral Daily  . mometasone-formoterol  2 puff Inhalation BID  . mupirocin ointment  1 application Nasal BID  . sodium chloride flush  3 mL Intravenous Q12H  . trihexyphenidyl  5 mg Oral TID WC   acetaminophen **OR** [DISCONTINUED] acetaminophen, albuterol, baclofen, benzonatate, bisacodyl, cyclobenzaprine, guaiFENesin-dextromethorphan, HYDROcodone-acetaminophen, ondansetron **OR** ondansetron (ZOFRAN) IV  Assessment/ Plan:  [name] is a 55 y.o. black male with schizophrenia, hypertension, hepatitis C, tobacco abuse, ESRD, anemia of CKD, SHPTH   [facility] MWF [place].   1. End stage Renal Disease with hyperkalemia: emergent hemodialysis on admission 12/26 No indication for dialysis today Resume MWF schedule  2. Hypertension: at goal.  - minoxidil, amlodipine, atenolol, clonidine, hydralazine  3. Anemia of chronic kidney disease: hemoglobin at goal. EPO as outpatient.   4. Secondary Hyperparathyroidism: Outpatient PTH, calcium and phosphorus is at goal - calcium acetate with meals    LOS: [place], [place] 12/28/201710:11 AM

## 2016-05-28 NOTE — Plan of Care (Signed)
Problem: Fluid Volume: Goal: Ability to maintain a balanced intake and output will improve Outcome: Progressing Edema improving; tolerated hemodialysis.

## 2016-05-28 NOTE — Progress Notes (Signed)
Dr. Manuella Ghazi notified of pt.'s BP being 107/67 and hydralazine is ordered, new orders to discontinuing hydralazine and new parameters to hold BP medication if systolic is less than 041 other than catapres. Give catapres even if systolic is 364 or less per verbal order.    Todd Brown [employer]

## 2016-05-28 NOTE — Progress Notes (Signed)
Todd Brown at [place] NAME: Todd Brown    MR#:  [id number]  DATE OF BIRTH:  Mar 27, 1961  SUBJECTIVE:  CHIEF COMPLAINT:   Chief Complaint  Patient presents with  . Shortness of Breath  . Cough  Slowly improving, still has cough REVIEW OF SYSTEMS:  Review of Systems  Constitutional: Negative for chills, fever and weight loss.  HENT: Negative for nosebleeds and sore throat.   Eyes: Negative for blurred vision.  Respiratory: Positive for cough and shortness of breath. Negative for wheezing.   Cardiovascular: Negative for chest pain, orthopnea, leg swelling and PND.  Gastrointestinal: Negative for abdominal pain, constipation, diarrhea, heartburn, nausea and vomiting.  Genitourinary: Negative for dysuria and urgency.  Musculoskeletal: Negative for back pain.  Skin: Negative for rash.  Neurological: Negative for dizziness, speech change, focal weakness and headaches.  Endo/Heme/Allergies: Does not bruise/bleed easily.  Psychiatric/Behavioral: Negative for depression.   DRUG ALLERGIES:   Allergies  Allergen Reactions  . Thorazine [Chlorpromazine] Other (See Comments)    Reaction:  Unknown , pt states it makes him feel real bad   VITALS:  Blood pressure 107/67, pulse 71, temperature 99.6 F (37.6 C), temperature source Oral, resp. rate 20, height 6\' 3"  (1.905 m), weight 68.7 kg (151 lb 7.3 oz), SpO2 99 %. PHYSICAL EXAMINATION:  Physical Exam  Constitutional: He is oriented to person, place, and time and well-developed, well-nourished, and in no distress.  HENT:  Head: Normocephalic and atraumatic.  Eyes: Conjunctivae and EOM are normal. Pupils are equal, round, and reactive to light.  Neck: Normal range of motion. Neck supple. No tracheal deviation present. No thyromegaly present.  Cardiovascular: Normal rate, regular rhythm and normal heart sounds.   Pulmonary/Chest: Effort normal and breath sounds normal. No respiratory distress.  He has no wheezes. He exhibits no tenderness.  Abdominal: Soft. Bowel sounds are normal. He exhibits no distension. There is no tenderness.  Musculoskeletal: Normal range of motion.  Lt arm AV fistula  Neurological: He is alert and oriented to person, place, and time. No cranial nerve deficit.  Skin: Skin is warm and dry. No rash noted.  Psychiatric: Mood and affect normal.   LABORATORY PANEL:   CBC  Recent Labs Lab 05/27/16 0119  WBC 9.3  HGB 10.9*  HCT 31.2*  PLT 132*   ------------------------------------------------------------------------------------------------------------------ Chemistries   Recent Labs Lab 05/26/16 1830 05/27/16 0119  NA 130* 136  K 5.5* 4.0  CL 93* 97*  CO2 24 29  GLUCOSE 115* 99  BUN 54* 30*  CREATININE 7.47* 4.97*  CALCIUM 8.9 8.7*  AST 65*  --   ALT 49  --   ALKPHOS 106  --   BILITOT 0.9  --    RADIOLOGY:  No results found. ASSESSMENT AND PLAN:  54 year old male being admitted for sepsis  * Sepsis: Present on admission. The likely source being lung - Much better and resolved  * Acute on chronic respiratory failure  - Likely due to fluid overload from missing dialysis session and pneumonia - Continue antibiotics for now. Procalcitonin high  * Elevated troponin - due to supply demand ischemia from sepsis - echo was normal LV systolic function was severely elevated pulmonary artery pressure - Pulmonary arteries: Systolic pressure was severely elevated. PA peak pressure: 84 mm Hg (S).  * Hypertension - Continue home medication and adjust as needed  * End-stage renal disease on hemodialysis - Dialysis as per nephrology  * Hyperkalemia - resolved with hemodialysis  Can transfer to any med-surg with off uni tele   All the records are reviewed and case discussed with Care Management/Social Worker. Management plans discussed with the patient, family and they are in agreement.  CODE STATUS: FULL CODE  TOTAL TIME TAKING  CARE OF THIS PATIENT: 35 minutes.   More than 50% of the time was spent in counseling/coordination of care: YES  POSSIBLE D/C IN AM, DEPENDING ON CLINICAL CONDITION.   Max Sane M.D on 05/28/2016 at 5:48 PM  Between 7am to 6pm - Pager - [phone]  After 6pm go to [email or web address] - [employer] [facility]  Office  [phone]  CC: Primary care physician; No PCP Per Patient  Note: This dictation was prepared with Dragon dictation along with smaller phrase technology. Any transcriptional errors that result from this process are unintentional.

## 2016-05-28 NOTE — Progress Notes (Signed)
RN notified Dr. Manuella Ghazi of pt.'s BP 132/77 and is order to receive atenolol, norvasc, catapres at this time. New orders to hold atenolol at this time and hydralazine due at 1200.   Todd Brown [employer]

## 2016-05-28 NOTE — Care Management (Signed)
HD info faxed to [facility] liaison .

## 2016-05-29 LAB — GLUCOSE, CAPILLARY: Glucose-Capillary: 133 mg/dL — ABNORMAL HIGH (ref 65–99)

## 2016-05-29 MED ORDER — IPRATROPIUM-ALBUTEROL 0.5-2.5 (3) MG/3ML IN SOLN
3.0000 mL | Freq: Four times a day (QID) | RESPIRATORY_TRACT | Status: DC
  Administered 2016-05-29 – 2016-05-31 (×8): 3 mL via RESPIRATORY_TRACT
  Filled 2016-05-29 (×9): qty 3

## 2016-05-29 NOTE — Progress Notes (Signed)
[place] at [place] NAME: Todd Brown    MR#:  [id number]  DATE OF BIRTH:  March 31, 1961  SUBJECTIVE:  CHIEF COMPLAINT:   Chief Complaint  Patient presents with  . Shortness of Breath  . Cough  very dyspneic, coughing, wheezing REVIEW OF SYSTEMS:  Review of Systems  Constitutional: Negative for chills, fever and weight loss.  HENT: Negative for nosebleeds and sore throat.   Eyes: Negative for blurred vision.  Respiratory: Positive for cough, shortness of breath and wheezing.   Cardiovascular: Negative for chest pain, orthopnea, leg swelling and PND.  Gastrointestinal: Negative for abdominal pain, constipation, diarrhea, heartburn, nausea and vomiting.  Genitourinary: Negative for dysuria and urgency.  Musculoskeletal: Negative for back pain.  Skin: Negative for rash.  Neurological: Negative for dizziness, speech change, focal weakness and headaches.  Endo/Heme/Allergies: Does not bruise/bleed easily.  Psychiatric/Behavioral: Negative for depression.   DRUG ALLERGIES:   Allergies  Allergen Reactions  . Thorazine [Chlorpromazine] Other (See Comments)    Reaction:  Unknown , pt states it makes him feel real bad   VITALS:  Blood pressure 122/70, pulse 75, temperature 98.4 F (36.9 C), temperature source Oral, resp. rate 17, height 6\' 3"  (1.905 m), weight 71.5 kg (157 lb 11.2 oz), SpO2 93 %. PHYSICAL EXAMINATION:  Physical Exam  Constitutional: He is oriented to person, place, and time and well-developed, well-nourished, and in no distress.  HENT:  Head: Normocephalic and atraumatic.  Eyes: Conjunctivae and EOM are normal. Pupils are equal, round, and reactive to light.  Neck: Normal range of motion. Neck supple. No tracheal deviation present. No thyromegaly present.  Cardiovascular: Normal rate, regular rhythm and normal heart sounds.   Pulmonary/Chest: He is in respiratory distress. He has wheezes. He has rales. He exhibits no  tenderness.  Abdominal: Soft. Bowel sounds are normal. He exhibits no distension. There is no tenderness.  Musculoskeletal: Normal range of motion.  Lt arm AV fistula  Neurological: He is alert and oriented to person, place, and time. No cranial nerve deficit.  Skin: Skin is warm and dry. No rash noted.  Psychiatric: Mood and affect normal.   LABORATORY PANEL:   CBC  Recent Labs Lab 05/27/16 0119  WBC 9.3  HGB 10.9*  HCT 31.2*  PLT 132*   ------------------------------------------------------------------------------------------------------------------ Chemistries   Recent Labs Lab 05/26/16 1830 05/27/16 0119  NA 130* 136  K 5.5* 4.0  CL 93* 97*  CO2 24 29  GLUCOSE 115* 99  BUN 54* 30*  CREATININE 7.47* 4.97*  CALCIUM 8.9 8.7*  AST 65*  --   ALT 49  --   ALKPHOS 106  --   BILITOT 0.9  --    RADIOLOGY:  No results found. ASSESSMENT AND PLAN:  55 year old male being admitted for sepsis  * Sepsis: Present on admission. The likely source being lung - Pro calcitonin 6.58  - Slowly improving  * Acute on chronic respiratory failure  - Likely due to fluid overload and pneumonia - Continue antibiotics for now. Procalcitonin very high  * Elevated troponin - due to supply demand ischemia from sepsis - echo was normal LV systolic function was severely elevated pulmonary artery pressure - Pulmonary arteries: Systolic pressure was severely elevated. PA peak pressure: 84 mm Hg (S).  * Hypertension - Continue home medication and adjust as needed  * End-stage renal disease on hemodialysis - Dialysis as per nephrology, Gets M-W-F  * Hyperkalemia - resolved with hemodialysis   D/C  tele  All the records are reviewed and case discussed with Care Management/Social Worker. Management plans discussed with the patient, RN and they are in agreement.  CODE STATUS: FULL CODE  TOTAL TIME TAKING CARE OF THIS PATIENT: 35 minutes.   More than 50% of the time was  spent in counseling/coordination of care: YES  POSSIBLE D/C IN 1-2 days, DEPENDING ON CLINICAL CONDITION.   Max Sane M.D on 05/29/2016 at 9:24 AM  Between 7am to 6pm - Pager - [phone]  After 6pm go to [email or web address] - Technical bre[employer] [facility]  Office  [phone]  CC: Primary care physician; No PCP Per Patient  Note: This dictation was prepared with Dragon dictation along with smaller phrase technology. Any transcriptional errors that result from this process are unintentional.

## 2016-05-29 NOTE — Progress Notes (Signed)
Pre HD assessment  

## 2016-05-29 NOTE — Progress Notes (Signed)
[facility]  ROUNDING NOTE   Subjective:   Continues to have cough. States shortness of breath and nausea have improved.   Objective:  Vital signs in last 24 hours:  Temp:  [98.2 F (36.8 C)-100.1 F (37.8 C)] 98.4 F (36.9 C) (12/29 0858) Pulse Rate:  [68-85] 75 (12/29 0858) Resp:  [17-24] 17 (12/29 0858) BP: (107-122)/(64-70) 122/70 (12/29 0858) SpO2:  [92 %-99 %] 93 % (12/29 0858) Weight:  [71.5 kg (157 lb 11.2 oz)] 71.5 kg (157 lb 11.2 oz) (12/29 0500)  Weight change: -0.768 kg (-1 lb 11.1 oz) Filed Weights   05/27/16 1813 05/28/16 0500 05/29/16 0500  Weight: 72.3 kg (159 lb 6.3 oz) 68.7 kg (151 lb 7.3 oz) 71.5 kg (157 lb 11.2 oz)    Intake/Output: I/O last 3 completed shifts: In: 446 [P.O.:390; I.V.:6; IV Piggyback:50] Out: 0737 [Urine:1050; Other:2248]   Intake/Output this shift:  No intake/output data recorded.  Physical Exam: General: NAD  Head: Normocephalic, atraumatic. Moist oral mucosal membranes  Eyes: Anicteric, PERRL  Neck: Supple, trachea midline  Lungs:  Bilateral crackles, rhonchi  Heart: Regular rate and rhythm  Abdomen:  Soft, nontender,   Extremities: No peripheral edema.  Neurologic: Nonfocal, moving all four extremities  Skin: No lesions  Access: Left arm AVF    Basic Metabolic Panel:  Recent Labs Lab 05/26/16 1241 05/26/16 1830 05/27/16 0119  NA 134* 130* 136  K 5.4* 5.5* 4.0  CL 94* 93* 97*  CO2 24 24 29   GLUCOSE 109* 115* 99  BUN 48* 54* 30*  CREATININE 7.13* 7.47* 4.97*  CALCIUM 9.4 8.9 8.7*    Liver Function Tests:  Recent Labs Lab 05/26/16 1241 05/26/16 1830  AST 81* 65*  ALT 55 49  ALKPHOS 124 106  BILITOT 0.5 0.9  PROT 7.7 6.9  ALBUMIN 3.9 3.5   No results for input(s): LIPASE, AMYLASE in the last 168 hours. No results for input(s): AMMONIA in the last 168 hours.  CBC:  Recent Labs Lab 05/26/16 1241 05/26/16 1830 05/27/16 0119  WBC 12.7* 11.7* 9.3  NEUTROABS 11.7* 10.0*  --   HGB  12.2* 11.8* 10.9*  HCT 35.6* 33.7* 31.2*  MCV 103.9* 103.7* 102.1*  PLT 181 171 132*    Cardiac Enzymes:  Recent Labs Lab 05/26/16 1241 05/26/16 1830 05/26/16 2142 05/27/16 0119  TROPONINI 0.07* 0.09* 0.10* 0.09*    BNP: Invalid input(s): POCBNP  CBG:  Recent Labs Lab 05/26/16 1716 05/28/16 0755 05/29/16 0758  GLUCAP 107* 108* 133*    Microbiology: Results for orders placed or performed during the hospital encounter of 05/26/16  Blood culture (routine x 2)     Status: None (Preliminary result)   Collection Time: 05/26/16 12:41 PM  Result Value Ref Range Status   Specimen Description BLOOD RIGHT AC  Final   Special Requests   Final    BOTTLES DRAWN AEROBIC AND ANAEROBIC AER 10ML ANA 17ML   Culture NO GROWTH 3 DAYS  Final   Report Status PENDING  Incomplete  Blood culture (routine x 2)     Status: None (Preliminary result)   Collection Time: 05/26/16 12:41 PM  Result Value Ref Range Status   Specimen Description BLOOD RIGHT AC  Final   Special Requests   Final    BOTTLES DRAWN AEROBIC AND ANAEROBIC AER 14ML ANA 8ML   Culture NO GROWTH 3 DAYS  Final   Report Status PENDING  Incomplete  MRSA PCR Screening     Status: Abnormal  Collection Time: 05/26/16  5:23 PM  Result Value Ref Range Status   MRSA by PCR POSITIVE (A) NEGATIVE Final    Comment:        The GeneXpert MRSA Assay (FDA approved for NASAL specimens only), is one component of a comprehensive MRSA colonization surveillance program. It is not intended to diagnose MRSA infection nor to guide or monitor treatment for MRSA infections. RESULT CALLED TO, READ BACK BY AND VERIFIED WITH: Todd Brown @1912  ON 05/26/2016 JLJ   Culture, sputum-assessment     Status: None   Collection Time: 05/28/16  7:12 AM  Result Value Ref Range Status   Specimen Description SPUTUM  Final   Special Requests NONE  Final   Sputum evaluation THIS SPECIMEN IS ACCEPTABLE FOR SPUTUM CULTURE  Final   Report Status  05/28/2016 FINAL  Final    Coagulation Studies:  Recent Labs  05/26/16 1830  LABPROT 14.7  INR 1.14    Urinalysis: No results for input(s): COLORURINE, LABSPEC, PHURINE, GLUCOSEU, HGBUR, BILIRUBINUR, KETONESUR, PROTEINUR, UROBILINOGEN, NITRITE, LEUKOCYTESUR in the last 72 hours.  Invalid input(s): APPERANCEUR    Imaging: No results found.   Medications:    . amLODipine  10 mg Oral Daily  . atenolol  25 mg Oral Daily  . azithromycin  500 mg Oral Q24H  . calcium acetate  1,334 mg Oral TID WC  . carbamazepine  1,000 mg Oral q morning - 10a  . cefTRIAXone  1 g Intravenous q1800  . Chlorhexidine Gluconate Cloth  6 each Topical Q0600  . cholecalciferol  1,000 Units Oral Daily  . cloNIDine  0.2 mg Oral TID  . docusate sodium  100 mg Oral BID  . ferrous sulfate  325 mg Oral Daily  . fluPHENAZine  25 mg Oral Daily  . fluticasone furoate-vilanterol  1 puff Inhalation Daily  . heparin  5,000 Units Subcutaneous Q8H  . ipratropium-albuterol  3 mL Nebulization Q6H  . mouth rinse  15 mL Mouth Rinse BID  . minoxidil  5 mg Oral Daily  . mometasone-formoterol  2 puff Inhalation BID  . mupirocin ointment  1 application Nasal BID  . sodium chloride flush  3 mL Intravenous Q12H  . trihexyphenidyl  5 mg Oral TID WC   acetaminophen **OR** [DISCONTINUED] acetaminophen, albuterol, baclofen, benzonatate, bisacodyl, cyclobenzaprine, guaiFENesin-dextromethorphan, HYDROcodone-acetaminophen, ondansetron **OR** ondansetron (ZOFRAN) IV  Assessment/ Plan:  [name] is a 55 y.o. black male with schizophrenia, hypertension, hepatitis C, tobacco abuse, ESRD, anemia of CKD, SHPTH   [facility] MWF [facility].   1. End stage Renal Disease with hyperkalemia on admission: emergent hemodialysis on admission 12/26 Dialysis two days in a row: 12/26 and 12/27 Resume MWF schedule: dialysis for later today. Orders prepared.   2. Hypertension: at goal. 122/70 - minoxidil, amlodipine,  atenolol, clonidine, hydralazine  3. Anemia of chronic kidney disease: hemoglobin at goal. 10.9 EPO as outpatient. Hold as inpatient.   4. Secondary Hyperparathyroidism: Outpatient PTH, calcium and phosphorus is at goal - calcium acetate with meals  5. Pneumonia with acute COPD exacerbation: - azithromycin and ceftiraxone.  - Continue oxygen and nebs    LOS: [place], [place] 12/29/201711:17 AM

## 2016-05-29 NOTE — Progress Notes (Signed)
Post dialysis 

## 2016-05-29 NOTE — Progress Notes (Signed)
HD initiated without issue  

## 2016-05-29 NOTE — Plan of Care (Signed)
Problem: Physical Regulation: Goal: Ability to maintain clinical measurements within normal limits will improve Outcome: Not Progressing Continues to wheeze and have productive cough; receiving scheduled SVN treatments.

## 2016-05-29 NOTE — Progress Notes (Signed)
Pre Dialysis 

## 2016-05-29 NOTE — Care Management (Signed)
Patient still requiring acute O2.  RN to continue to attempt to wean

## 2016-05-30 ENCOUNTER — Inpatient Hospital Stay: Payer: Medicaid Other

## 2016-05-30 LAB — BASIC METABOLIC PANEL
Anion gap: 9 (ref 5–15)
BUN: 29 mg/dL — ABNORMAL HIGH (ref 6–20)
CALCIUM: 8.3 mg/dL — AB (ref 8.9–10.3)
CO2: 29 mmol/L (ref 22–32)
CREATININE: 5.49 mg/dL — AB (ref 0.61–1.24)
Chloride: 98 mmol/L — ABNORMAL LOW (ref 101–111)
GFR, EST AFRICAN AMERICAN: 12 mL/min — AB (ref >60)
GFR, EST NON AFRICAN AMERICAN: 11 mL/min — AB (ref >60)
Glucose, Bld: 149 mg/dL — ABNORMAL HIGH (ref 65–99)
Potassium: 3.8 mmol/L (ref 3.5–5.1)
Sodium: 136 mmol/L (ref 135–145)

## 2016-05-30 LAB — CBC
HEMATOCRIT: 32.7 % — AB (ref 40.0–52.0)
Hemoglobin: 11.3 g/dL — ABNORMAL LOW (ref 13.0–18.0)
MCH: 36 pg — ABNORMAL HIGH (ref 26.0–34.0)
MCHC: 34.5 g/dL (ref 32.0–36.0)
MCV: 104.3 fL — ABNORMAL HIGH (ref 80.0–100.0)
PLATELETS: 135 10*3/uL — AB (ref 150–440)
RBC: 3.14 MIL/uL — ABNORMAL LOW (ref 4.40–5.90)
RDW: 13.5 % (ref 11.5–14.5)
WBC: 3.6 10*3/uL — AB (ref 3.8–10.6)

## 2016-05-30 LAB — CULTURE, RESPIRATORY W GRAM STAIN: Culture: NORMAL

## 2016-05-30 LAB — CULTURE, RESPIRATORY

## 2016-05-30 LAB — GLUCOSE, CAPILLARY: Glucose-Capillary: 102 mg/dL — ABNORMAL HIGH (ref 65–99)

## 2016-05-30 NOTE — Progress Notes (Signed)
[place] at [place] NAME: Todd Brown    MR#:  [id number]  DATE OF BIRTH:  Oct 07, 1960  SUBJECTIVE:  CHIEF COMPLAINT:   Chief Complaint  Patient presents with  . Shortness of Breath  . Cough  Patient is sleeping comfortably. Arousable, answers questions and falling asleep. Reports he is very tired REVIEW OF SYSTEMS:  Review of Systems  Constitutional: Negative for chills, fever and weight loss.  HENT: Negative for nosebleeds and sore throat.   Eyes: Negative for blurred vision.  Respiratory: Positive for cough. Negative for shortness of breath and wheezing.   Cardiovascular: Negative for chest pain, orthopnea, leg swelling and PND.  Gastrointestinal: Negative for abdominal pain, constipation, diarrhea, heartburn, nausea and vomiting.  Genitourinary: Negative for dysuria and urgency.  Musculoskeletal: Negative for back pain.  Skin: Negative for rash.  Neurological: Negative for dizziness, speech change, focal weakness and headaches.  Endo/Heme/Allergies: Does not bruise/bleed easily.  Psychiatric/Behavioral: Negative for depression.   DRUG ALLERGIES:   Allergies  Allergen Reactions  . Thorazine [Chlorpromazine] Other (See Comments)    Reaction:  Unknown , pt states it makes him feel real bad   VITALS:  Blood pressure 138/78, pulse 68, temperature 98.7 F (37.1 C), temperature source Oral, resp. rate 18, height 6\' 3"  (1.905 m), weight 69.5 kg (153 lb 4.8 oz), SpO2 93 %. PHYSICAL EXAMINATION:  Physical Exam  Constitutional: He is oriented to person, place, and time and well-developed, well-nourished, and in no distress.  HENT:  Head: Normocephalic and atraumatic.  Eyes: Conjunctivae and EOM are normal. Pupils are equal, round, and reactive to light.  Neck: Normal range of motion. Neck supple. No tracheal deviation present. No thyromegaly present.  Cardiovascular: Normal rate, regular rhythm and normal heart sounds.     Pulmonary/Chest: No respiratory distress. He has no wheezes. He has rales. He exhibits no tenderness.  Abdominal: Soft. Bowel sounds are normal. He exhibits no distension. There is no tenderness.  Musculoskeletal: Normal range of motion.  Lt arm AV fistula  Neurological: He is alert and oriented to person, place, and time. No cranial nerve deficit.  Skin: Skin is warm and dry. No rash noted.  Psychiatric: Mood and affect normal.   LABORATORY PANEL:   CBC  Recent Labs Lab 05/30/16 0534  WBC 3.6*  HGB 11.3*  HCT 32.7*  PLT 135*   ------------------------------------------------------------------------------------------------------------------ Chemistries   Recent Labs Lab 05/26/16 1830  05/30/16 0534  NA 130*  < > 136  K 5.5*  < > 3.8  CL 93*  < > 98*  CO2 24  < > 29  GLUCOSE 115*  < > 149*  BUN 54*  < > 29*  CREATININE 7.47*  < > 5.49*  CALCIUM 8.9  < > 8.3*  AST 65*  --   --   ALT 49  --   --   ALKPHOS 106  --   --   BILITOT 0.9  --   --   < > = values in this interval not displayed. RADIOLOGY:  Dg Chest 2 View  Result Date: 05/30/2016 CLINICAL DATA:  55 year old male with cough and shortness of breath. Dialysis patient. Initial encounter. EXAM: CHEST  2 VIEW COMPARISON:  05/27/2016 and earlier. FINDINGS: Seated AP and lateral views of the chest. Stable cardiomegaly and mediastinal contours. Stable lung volumes. Mild regression of perihilar and basilar pulmonary interstitial opacity. No pleural effusion. No new pulmonary opacity. No pneumothorax. Visualized tracheal air column is  within normal limits. No acute osseous abnormality identified. IMPRESSION: Mild/partial regression of pulmonary interstitial opacity, favor improving interstitial edema. Main differential consideration is viral/atypical respiratory infection. Stable cardiomegaly.  No pleural effusion. Electronically Signed   By: Genevie Ann M.D.   On: 05/30/2016 08:49   ASSESSMENT AND PLAN:  55 year old male  being admitted for sepsis  * Sepsis: Present on admission. Secondary to pneumonia - Pro calcitonin 6.58  - Slowly clinically improving -Continue IV Rocephin and azithromycin -Continue oxygen and nebs  * Acute on chronic respiratory failure Secondary to pneumonia and fluid overload -Clinically better - Continue antibiotics for now. Procalcitonin very high -Hemodialysis for fluid overload  * Elevated troponin - due to supply demand ischemia from sepsis - echo was normal LV systolic function was severely elevated pulmonary artery pressure - Pulmonary arteries: Systolic pressure was severely elevated. PA peak pressure: 84 mm Hg (S).  * Hypertension - Continue home medication and adjust as needed  * End-stage renal disease on hemodialysis - Dialysis as per nephrology, Gets M-W-F  * Hyperkalemia - resolved with hemodialysis   D/C tele  All the records are reviewed and case discussed with Care Management/Social Worker. Management plans discussed with the patient, RN and they are in agreement.  CODE STATUS: FULL CODE  TOTAL TIME TAKING CARE OF THIS PATIENT: 34 minutes.   More than 50% of the time was spent in counseling/coordination of care: YES  POSSIBLE D/C IN 1-2 days, DEPENDING ON CLINICAL CONDITION.   Todd Brown M.D on 05/30/2016 at 2:12 PM  Between 7am to 6pm - Pager - [phone]  After 6pm go to [email or web address] - Technical brewer [facility]  Office  [phone]  CC: Primary care physician; No PCP Per Patient  Note: This dictation was prepared with Dragon dictation along with smaller phrase technology. Any transcriptional errors that result from this process are unintentional.

## 2016-05-30 NOTE — Progress Notes (Signed)
[facility]  ROUNDING NOTE   Subjective:   Hemodialysis yesterday. Tolerated treatment well. UF of 2 litres  Objective:  Vital signs in last 24 hours:  Temp:  [98.2 F (36.8 C)-99.2 F (37.3 C)] 98.9 F (37.2 C) (12/30 0814) Pulse Rate:  [62-78] 62 (12/30 0814) Resp:  [16-26] 18 (12/30 0814) BP: (129-187)/(69-105) 142/80 (12/30 0814) SpO2:  [93 %-97 %] 93 % (12/30 0507) Weight:  [68.8 kg (151 lb 10.8 oz)-70.9 kg (156 lb 4.9 oz)] 69.5 kg (153 lb 4.8 oz) (12/30 0500)  Weight change: -0.632 kg (-1 lb 6.3 oz) Filed Weights   05/29/16 1426 05/29/16 1744 05/30/16 0500  Weight: 70.9 kg (156 lb 4.9 oz) 68.8 kg (151 lb 10.8 oz) 69.5 kg (153 lb 4.8 oz)    Intake/Output: I/O last 3 completed shifts: In: 393 [P.O.:390; I.V.:3] Out: 2500 [Urine:500; Other:2000]   Intake/Output this shift:  Total I/O In: 260 [P.O.:260] Out: -   Physical Exam: General: NAD  Head: Normocephalic, atraumatic. Moist oral mucosal membranes  Eyes: Anicteric, PERRL  Neck: Supple, trachea midline  Lungs:  Bilateral crackles, rhonchi  Heart: Regular rate and rhythm  Abdomen:  Soft, nontender,   Extremities: No peripheral edema.  Neurologic: Nonfocal, moving all four extremities  Skin: No lesions  Access: Left arm AVF    Basic Metabolic Panel:  Recent Labs Lab 05/26/16 1241 05/26/16 1830 05/27/16 0119 05/30/16 0534  NA 134* 130* 136 136  K 5.4* 5.5* 4.0 3.8  CL 94* 93* 97* 98*  CO2 24 24 29 29   GLUCOSE 109* 115* 99 149*  BUN 48* 54* 30* 29*  CREATININE 7.13* 7.47* 4.97* 5.49*  CALCIUM 9.4 8.9 8.7* 8.3*    Liver Function Tests:  Recent Labs Lab 05/26/16 1241 05/26/16 1830  AST 81* 65*  ALT 55 49  ALKPHOS 124 106  BILITOT 0.5 0.9  PROT 7.7 6.9  ALBUMIN 3.9 3.5   No results for input(s): LIPASE, AMYLASE in the last 168 hours. No results for input(s): AMMONIA in the last 168 hours.  CBC:  Recent Labs Lab 05/26/16 1241 05/26/16 1830 05/27/16 0119 05/30/16 0534   WBC 12.7* 11.7* 9.3 3.6*  NEUTROABS 11.7* 10.0*  --   --   HGB 12.2* 11.8* 10.9* 11.3*  HCT 35.6* 33.7* 31.2* 32.7*  MCV 103.9* 103.7* 102.1* 104.3*  PLT 181 171 132* 135*    Cardiac Enzymes:  Recent Labs Lab 05/26/16 1241 05/26/16 1830 05/26/16 2142 05/27/16 0119  TROPONINI 0.07* 0.09* 0.10* 0.09*    BNP: Invalid input(s): POCBNP  CBG:  Recent Labs Lab 05/26/16 1716 05/28/16 0755 05/29/16 0758 05/30/16 0725  GLUCAP 107* 108* 133* 102*    Microbiology: Results for orders placed or performed during the hospital encounter of 05/26/16  Blood culture (routine x 2)     Status: None (Preliminary result)   Collection Time: 05/26/16 12:41 PM  Result Value Ref Range Status   Specimen Description BLOOD RIGHT AC  Final   Special Requests   Final    BOTTLES DRAWN AEROBIC AND ANAEROBIC AER 10ML ANA 17ML   Culture NO GROWTH 4 DAYS  Final   Report Status PENDING  Incomplete  Blood culture (routine x 2)     Status: None (Preliminary result)   Collection Time: 05/26/16 12:41 PM  Result Value Ref Range Status   Specimen Description BLOOD RIGHT AC  Final   Special Requests   Final    BOTTLES DRAWN AEROBIC AND ANAEROBIC AER 14ML ANA 8ML  Culture NO GROWTH 4 DAYS  Final   Report Status PENDING  Incomplete  MRSA PCR Screening     Status: Abnormal   Collection Time: 05/26/16  5:23 PM  Result Value Ref Range Status   MRSA by PCR POSITIVE (A) NEGATIVE Final    Comment:        The GeneXpert MRSA Assay (FDA approved for NASAL specimens only), is one component of a comprehensive MRSA colonization surveillance program. It is not intended to diagnose MRSA infection nor to guide or monitor treatment for MRSA infections. RESULT CALLED TO, READ BACK BY AND VERIFIED WITH: PAM CRAWFORD @1912  ON 05/26/2016 JLJ   Culture, sputum-assessment     Status: None   Collection Time: 05/28/16  7:12 AM  Result Value Ref Range Status   Specimen Description SPUTUM  Final   Special Requests  NONE  Final   Sputum evaluation THIS SPECIMEN IS ACCEPTABLE FOR SPUTUM CULTURE  Final   Report Status 05/28/2016 FINAL  Final  Culture, respiratory (NON-Expectorated)     Status: None (Preliminary result)   Collection Time: 05/28/16  7:12 AM  Result Value Ref Range Status   Specimen Description SPUTUM  Final   Special Requests NONE Reflexed from [id number]  Final   Gram Stain PENDING  Incomplete   Culture   Final    NO GROWTH 1 DAY Performed at [facility]    Report Status PENDING  Incomplete    Coagulation Studies: No results for input(s): LABPROT, INR in the last 72 hours.  Urinalysis: No results for input(s): COLORURINE, LABSPEC, PHURINE, GLUCOSEU, HGBUR, BILIRUBINUR, KETONESUR, PROTEINUR, UROBILINOGEN, NITRITE, LEUKOCYTESUR in the last 72 hours.  Invalid input(s): APPERANCEUR    Imaging: Dg Chest 2 View  Result Date: 05/30/2016 CLINICAL DATA:  55 year old male with cough and shortness of breath. Dialysis patient. Initial encounter. EXAM: CHEST  2 VIEW COMPARISON:  05/27/2016 and earlier. FINDINGS: Seated AP and lateral views of the chest. Stable cardiomegaly and mediastinal contours. Stable lung volumes. Mild regression of perihilar and basilar pulmonary interstitial opacity. No pleural effusion. No new pulmonary opacity. No pneumothorax. Visualized tracheal air column is within normal limits. No acute osseous abnormality identified. IMPRESSION: Mild/partial regression of pulmonary interstitial opacity, favor improving interstitial edema. Main differential consideration is viral/atypical respiratory infection. Stable cardiomegaly.  No pleural effusion. Electronically Signed   By: Genevie Ann M.D.   On: 05/30/2016 08:49     Medications:    . amLODipine  10 mg Oral Daily  . atenolol  25 mg Oral Daily  . azithromycin  500 mg Oral Q24H  . calcium acetate  1,334 mg Oral TID WC  . carbamazepine  1,000 mg Oral q morning - 10a  . cefTRIAXone  1 g Intravenous q1800  .  Chlorhexidine Gluconate Cloth  6 each Topical Q0600  . cholecalciferol  1,000 Units Oral Daily  . cloNIDine  0.2 mg Oral TID  . docusate sodium  100 mg Oral BID  . ferrous sulfate  325 mg Oral Daily  . fluPHENAZine  25 mg Oral Daily  . fluticasone furoate-vilanterol  1 puff Inhalation Daily  . heparin  5,000 Units Subcutaneous Q8H  . ipratropium-albuterol  3 mL Nebulization Q6H  . mouth rinse  15 mL Mouth Rinse BID  . minoxidil  5 mg Oral Daily  . mometasone-formoterol  2 puff Inhalation BID  . mupirocin ointment  1 application Nasal BID  . sodium chloride flush  3 mL Intravenous Q12H  . trihexyphenidyl  5 mg  Oral TID WC   acetaminophen **OR** [DISCONTINUED] acetaminophen, albuterol, baclofen, benzonatate, bisacodyl, cyclobenzaprine, guaiFENesin-dextromethorphan, HYDROcodone-acetaminophen, ondansetron **OR** ondansetron (ZOFRAN) IV  Assessment/ Plan:  [name] is a 55 y.o. black male with schizophrenia, hypertension, hepatitis C, tobacco abuse, ESRD, anemia of CKD, SHPTH   [facility] MWF [facility].   1. End stage Renal Disease with hyperkalemia on admission: emergent hemodialysis on admission 12/26 Dialysis two days in a row: 12/26 and 12/27 Dialysis yesterday. Resume MWF schedule  2. Hypertension: at goal.   - minoxidil, amlodipine, atenolol, clonidine, hydralazine  3. Anemia of chronic kidney disease: hemoglobin at goal. EPO as outpatient. Hold as inpatient.   4. Secondary Hyperparathyroidism: Outpatient PTH, calcium and phosphorus is at goal - calcium acetate with meals  5. Pneumonia with acute COPD exacerbation: - azithromycin and ceftiraxone.  - Continue oxygen and nebs    LOS: [place], Orlanda Lemmerman 12/30/201711:17 AM

## 2016-05-31 LAB — CULTURE, BLOOD (ROUTINE X 2)
CULTURE: NO GROWTH
Culture: NO GROWTH

## 2016-05-31 LAB — GLUCOSE, CAPILLARY: GLUCOSE-CAPILLARY: 78 mg/dL (ref 65–99)

## 2016-05-31 MED ORDER — AMOXICILLIN-POT CLAVULANATE 500-125 MG PO TABS: 1.0000 | ORAL_TABLET | Freq: Three times a day (TID) | ORAL | 0 refills | Status: DC

## 2016-05-31 MED ORDER — BENZONATATE 200 MG PO CAPS: 200.0000 mg | ORAL_CAPSULE | Freq: Two times a day (BID) | ORAL | 0 refills | Status: DC | PRN

## 2016-05-31 NOTE — Discharge Summary (Addendum)
[facility] [place] NAME: Todd Brown    MR#:  [id number]  DATE OF BIRTH:  [birth year]  DATE OF ADMISSION:  05/26/2016 ADMITTING PHYSICIAN: Max Sane, MD  DATE OF DISCHARGE: 05/31/16 PRIMARY CARE PHYSICIAN: No PCP Per Patient    ADMISSION DIAGNOSIS:  Acute respiratory distress [R06.03] Acute pulmonary edema ([place]) [J81.0] Sepsis, due to unspecified organism ([place]) [A41.9]  DISCHARGE DIAGNOSIS:  Active Problems:   Sepsis ([place])   SECONDARY DIAGNOSIS:   Past Medical History:  Diagnosis Date  . Anemia   . Diabetes mellitus without complication ([place])   . Dialysis patient [facility])    Mon. -Wed.- Fri  . ESRD (end stage renal disease) ([place])   . Hyperlipidemia   . Malignant hypertension   . Renal artery stenosis ([place])   . Schizophrenia [facility])     HOSPITAL COURSE:  Brief history and physical Todd Brown  is a 55 y.o. male with a known history of End-stage renal disease on hemodialysis Monday Wednesday Friday, diabetes, schizophrenia and is being admitted for acute on chronic hypoxic respiratory failure.  Patient presents with severe shortness of breath. Patient reports he did not get dialysis yesterday because of the holidays. He reports fever, chills and has a significant cough. He denies chest pain, but more of chest pressure, likely from constant coughing.  While in the emergency department his temperature was 102.9, RR 32, elevated lactate of 2.4 & has leukocytosis. Please review history and physical for details  Hospital course  * Sepsis: Present on admission.Secondary to pneumonia Clinically improved significantly with IV Rocephin, azithromycin, oxygen and nebulizer treatments -We will discharge patient home with by mouth Augmentin - Pro calcitonin 6.58   * Acute on chronic respiratory failure Secondary to pneumonia and fluid overload -Clinically better - Continue antibiotics . Procalcitonin very highAt the time of  admission -Hemodialysis for fluid overload, patient had emergent hemodialysis on 12/26 for fluid overload -Sputum culture with rare gram-negative coccobacilli and gram-positive cocci in pairs normal respiratory flora  * Elevated troponin - due to supply demand ischemia from sepsis - echo was normal LV systolic function was severely elevated pulmonary artery pressure - Pulmonary arteries: Systolic pressure was severely elevated. PA peak pressure: 84 mm Hg (S).  * Hypertension - Continue home medication and adjust as needed  * End-stage renal disease on hemodialysis - Dialysis as per nephrology, Gets M-W-F  * Hyperkalemia - resolvedwith hemodialysis  Outpatient follow-up with ACT program  D/C tele  DISCHARGE CONDITIONS:   stable  CONSULTS OBTAINED:  Treatment Team:  Lavonia Dana, MD   PROCEDURES  None   DRUG ALLERGIES:   Allergies  Allergen Reactions  . Thorazine [Chlorpromazine] Other (See Comments)    Reaction:  Unknown , pt states it makes him feel real bad    DISCHARGE MEDICATIONS:   Current Discharge Medication List    START taking these medications   Details  amoxicillin-clavulanate (AUGMENTIN) 500-125 MG tablet Take 1 tablet (500 mg total) by mouth 3 (three) times daily. Qty: 15 tablet, Refills: 0    benzonatate (TESSALON) 200 MG capsule Take 1 capsule (200 mg total) by mouth 2 (two) times daily as needed for cough. Qty: 20 capsule, Refills: 0      CONTINUE these medications which have NOT CHANGED   Details  acetaminophen (TYLENOL) 500 MG tablet Take 500 mg by mouth every 4 (four) hours as needed for mild pain. Take 1 tablet every 4-6 hours as needed for pain.  albuterol (PROAIR HFA) 108 (90 Base) MCG/ACT inhaler Inhale 1-2 puffs into the lungs every 6 (six) hours as needed for wheezing or shortness of breath (cough).    b complex-vitamin c-folic acid (NEPHRO-VITE) 0.8 MG TABS tablet Take 1 tablet by mouth daily.    baclofen (LIORESAL) 10  MG tablet Take 1 tablet (10 mg total) by mouth 2 (two) times daily as needed for muscle spasms. Qty: 30 each, Refills: 0    carbamazepine (TEGRETOL XR) 200 MG 12 hr tablet Take 1,000 mg by mouth every morning.    cyclobenzaprine (FLEXERIL) 5 MG tablet Take 5 mg by mouth at bedtime as needed for muscle spasms.    fluPHENAZine decanoate (PROLIXIN) 25 MG/ML injection Inject 25 mg into the muscle every 14 (fourteen) days.    fluticasone furoate-vilanterol (BREO ELLIPTA) 100-25 MCG/INH AEPB Inhale 1 puff into the lungs daily.    Fluticasone-Salmeterol (ADVAIR DISKUS) 250-50 MCG/DOSE AEPB Inhale 1 puff into the lungs 2 (two) times daily.    hydrALAZINE (APRESOLINE) 100 MG tablet Take 1 tablet (100 mg total) by mouth every 6 (six) hours. Qty: 120 tablet, Refills: 1    traMADol (ULTRAM) 50 MG tablet Take 1 tablet (50 mg total) by mouth every 6 (six) hours as needed. Qty: 30 tablet, Refills: 0    trihexyphenidyl (ARTANE) 5 MG tablet Take 5 mg by mouth 3 (three) times daily with meals.    amLODipine (NORVASC) 10 MG tablet Take 10 mg by mouth daily.    atenolol (TENORMIN) 25 MG tablet Take 25 mg by mouth daily.    !! calcium acetate (PHOSLO) 667 MG capsule Take 2 capsules (1,334 mg total) by mouth 3 (three) times daily with meals. Qty: 60 capsule, Refills: 1    !! calcium acetate (PHOSLO) 667 MG capsule Take 1,334 mg by mouth 3 (three) times daily with meals.     carbamazepine (TEGRETOL) 200 MG tablet Take 2.5 tablets (500 mg total) by mouth 2 (two) times daily. Taken 2 1/2 tabs (500mg  ) twice a day Qty: 150 tablet, Refills: 0    cholecalciferol (VITAMIN D) 1000 UNITS tablet Take 1,000 Units by mouth daily.    citalopram (CELEXA) 20 MG tablet Take 1 tablet (20 mg total) by mouth daily. Qty: 30 tablet, Refills: 1    cloNIDine (CATAPRES) 0.2 MG tablet Take 1 tablet (0.2 mg total) by mouth 3 (three) times daily. Qty: 90 tablet, Refills: 1    ferrous sulfate 325 (65 FE) MG tablet Take 1  tablet (325 mg total) by mouth daily. Qty: 30 tablet, Refills: 3    fluPHENAZine (PROLIXIN) 2.5 MG tablet Take 25 mg by mouth daily.     minoxidil (LONITEN) 2.5 MG tablet Take 2 tablets (5 mg total) by mouth daily. Qty: 60 tablet, Refills: 1     !! - Potential duplicate medications found. Please discuss with provider.       DISCHARGE INSTRUCTIONS:   Continue hemodialysis on Monday, Wednesday and Friday. Outpatient nephrology in 2-3 days Outpatient follow-up with primary care physician in a week Continue to follow-up with ACT program  DIET:  Renal diet  DISCHARGE CONDITION:  Fair  ACTIVITY:  Activity as tolerated  OXYGEN:  Home Oxygen: No.   Oxygen Delivery: room air  DISCHARGE LOCATION:  home   If you experience worsening of your admission symptoms, develop shortness of breath, life threatening emergency, suicidal or homicidal thoughts you must seek medical attention immediately by calling 911 or calling your MD immediately  if symptoms less  severe.  You Must read complete instructions/literature along with all the possible adverse reactions/side effects for all the Medicines you take and that have been prescribed to you. Take any new Medicines after you have completely understood and accpet all the possible adverse reactions/side effects.   Please note  You were cared for by a hospitalist during your hospital stay. If you have any questions about your discharge medications or the care you received while you were in the hospital after you are discharged, you can call the unit and asked to speak with the hospitalist on call if the hospitalist that took care of you is not available. Once you are discharged, your primary care physician will handle any further medical issues. Please note that NO REFILLS for any discharge medications will be authorized once you are discharged, as it is imperative that you return to your primary care physician (or establish a relationship with a  primary care physician if you do not have one) for your aftercare needs so that they can reassess your need for medications and monitor your lab values.     Today  Chief Complaint  Patient presents with  . Shortness of Breath  . Cough   Patient is wide awake and alert today answering all questions appropriately. Wants to go home. According to the nurse patient ambulated without any difficulty  ROS:  CONSTITUTIONAL: Denies fevers, chills. Denies any fatigue, weakness.  EYES: Denies blurry vision, double vision, eye pain. EARS, NOSE, THROAT: Denies tinnitus, ear pain, hearing loss. RESPIRATORY: Denies cough, wheeze, shortness of breath.  CARDIOVASCULAR: Denies chest pain, palpitations, edema.  GASTROINTESTINAL: Denies nausea, vomiting, diarrhea, abdominal pain. Denies bright red blood per rectum. GENITOURINARY: Denies dysuria, hematuria. ENDOCRINE: Denies nocturia or thyroid problems. HEMATOLOGIC AND LYMPHATIC: Denies easy bruising or bleeding. SKIN: Denies rash or lesion. MUSCULOSKELETAL: Denies pain in neck, back, shoulder, knees, hips or arthritic symptoms.  NEUROLOGIC: Denies paralysis, paresthesias.  PSYCHIATRIC: Denies anxiety or depressive symptoms.   VITAL SIGNS:  Blood pressure 128/68, pulse (!) 55, temperature 97.5 F (36.4 C), temperature source Oral, resp. rate 18, height 6\' 3"  (1.905 m), weight 69.5 kg (153 lb 4.8 oz), SpO2 95 %.  I/O:    Intake/Output Summary (Last 24 hours) at 05/31/16 1306 Last data filed at 05/31/16 0930  Gross per 24 hour  Intake              530 ml  Output              550 ml  Net              -20 ml    PHYSICAL EXAMINATION:  GENERAL:  55 y.o.-year-old patient lying in the bed with no acute distress.  EYES: Pupils equal, round, reactive to light and accommodation. No scleral icterus. Extraocular muscles intact.  HEENT: Head atraumatic, normocephalic. Oropharynx and nasopharynx clear.  NECK:  Supple, no jugular venous distention. No  thyroid enlargement, no tenderness.  LUNGS: Normal breath sounds bilaterally, no wheezing, rales,rhonchi or crepitation. No use of accessory muscles of respiration.  CARDIOVASCULAR: S1, S2 normal. No murmurs, rubs, or gallops.  ABDOMEN: Soft, non-tender, non-distended. Bowel sounds present. No organomegaly or mass.  EXTREMITIES: No pedal edema, cyanosis, or clubbing.  NEUROLOGIC: Cranial nerves II through XII are intact. Muscle strength 5/5 in all extremities. Sensation intact. Gait not checked.  PSYCHIATRIC: The patient is alert and oriented x 3.  SKIN: No obvious rash, lesion, or ulcer.   DATA REVIEW:   CBC  Recent Labs Lab 05/30/16 0534  WBC 3.6*  HGB 11.3*  HCT 32.7*  PLT 135*    Chemistries   Recent Labs Lab 05/26/16 1830  05/30/16 0534  NA 130*  < > 136  K 5.5*  < > 3.8  CL 93*  < > 98*  CO2 24  < > 29  GLUCOSE 115*  < > 149*  BUN 54*  < > 29*  CREATININE 7.47*  < > 5.49*  CALCIUM 8.9  < > 8.3*  AST 65*  --   --   ALT 49  --   --   ALKPHOS 106  --   --   BILITOT 0.9  --   --   < > = values in this interval not displayed.  Cardiac Enzymes  Recent Labs Lab 05/27/16 0119  TROPONINI 0.09*    Microbiology Results  Results for orders placed or performed during the hospital encounter of 05/26/16  Blood culture (routine x 2)     Status: None   Collection Time: 05/26/16 12:41 PM  Result Value Ref Range Status   Specimen Description BLOOD RIGHT AC  Final   Special Requests   Final    BOTTLES DRAWN AEROBIC AND ANAEROBIC AER 10ML ANA 17ML   Culture NO GROWTH 5 DAYS  Final   Report Status 05/31/2016 FINAL  Final  Blood culture (routine x 2)     Status: None   Collection Time: 05/26/16 12:41 PM  Result Value Ref Range Status   Specimen Description BLOOD RIGHT AC  Final   Special Requests   Final    BOTTLES DRAWN AEROBIC AND ANAEROBIC AER 14ML ANA 8ML   Culture NO GROWTH 5 DAYS  Final   Report Status 05/31/2016 FINAL  Final  MRSA PCR Screening     Status:  Abnormal   Collection Time: 05/26/16  5:23 PM  Result Value Ref Range Status   MRSA by PCR POSITIVE (A) NEGATIVE Final    Comment:        The GeneXpert MRSA Assay (FDA approved for NASAL specimens only), is one component of a comprehensive MRSA colonization surveillance program. It is not intended to diagnose MRSA infection nor to guide or monitor treatment for MRSA infections. RESULT CALLED TO, READ BACK BY AND VERIFIED WITH: PAM CRAWFORD @1912  ON 05/26/2016 JLJ   Culture, sputum-assessment     Status: None   Collection Time: 05/28/16  7:12 AM  Result Value Ref Range Status   Specimen Description SPUTUM  Final   Special Requests NONE  Final   Sputum evaluation THIS SPECIMEN IS ACCEPTABLE FOR SPUTUM CULTURE  Final   Report Status 05/28/2016 FINAL  Final  Culture, respiratory (NON-Expectorated)     Status: None   Collection Time: 05/28/16  7:12 AM  Result Value Ref Range Status   Specimen Description SPUTUM  Final   Special Requests NONE Reflexed from [id number]  Final   Gram Stain   Final    FEW WBC PRESENT, PREDOMINANTLY PMN RARE SQUAMOUS EPITHELIAL CELLS PRESENT RARE GRAM NEGATIVE COCCOBACILLI RARE GRAM POSITIVE COCCI IN PAIRS    Culture   Final    Consistent with normal respiratory flora. Performed at [facility]    Report Status 05/30/2016 FINAL  Final    RADIOLOGY:  Dg Chest 2 View  Result Date: 05/30/2016 CLINICAL DATA:  55 year old male with cough and shortness of breath. Dialysis patient. Initial encounter. EXAM: CHEST  2 VIEW COMPARISON:  05/27/2016 and earlier. FINDINGS: Seated AP  and lateral views of the chest. Stable cardiomegaly and mediastinal contours. Stable lung volumes. Mild regression of perihilar and basilar pulmonary interstitial opacity. No pleural effusion. No new pulmonary opacity. No pneumothorax. Visualized tracheal air column is within normal limits. No acute osseous abnormality identified. IMPRESSION: Mild/partial regression of pulmonary  interstitial opacity, favor improving interstitial edema. Main differential consideration is viral/atypical respiratory infection. Stable cardiomegaly.  No pleural effusion. Electronically Signed   By: Genevie Ann M.D.   On: 05/30/2016 08:49    EKG:   Orders placed or performed during the hospital encounter of 05/26/16  . EKG 12-Lead  . EKG 12-Lead  . EKG 12-Lead  . EKG 12-Lead      Management plans discussed with the patient, family and they are in agreement.  CODE STATUS:     Code Status Orders        Start     Ordered   05/26/16 1742  Full code  Continuous     05/26/16 1741    Code Status History    Date Active Date Inactive Code Status Order ID Comments User Context   05/16/2015 11:43 PM 05/20/2015  6:52 PM Full Code [id number]  Lytle Butte, MD ED   10/20/2014  9:01 PM 10/31/2014  8:02 PM Full Code [id number]  Lytle Butte, MD Inpatient      TOTAL TIME TAKING CARE OF THIS PATIENT: 45 minutes.   Note: This dictation was prepared with Dragon dictation along with smaller phrase technology. Any transcriptional errors that result from this process are unintentional.   @MEC @  on 05/31/2016 at 1:06 PM  Between 7am to 6pm - Pager - [phone]  After 6pm go to [email or web address] - password [email or web address] [facility]  [facility]  Office  [phone]  CC: Primary care physician; No PCP Per Patient

## 2016-05-31 NOTE — Progress Notes (Signed)
[facility]  ROUNDING NOTE   Subjective:   Breathing better. Patient in good mood  Objective:  Vital signs in last 24 hours:  Temp:  [97.5 F (36.4 C)-98.7 F (37.1 C)] 97.5 F (36.4 C) (12/31 0527) Pulse Rate:  [55-70] 55 (12/31 0527) Resp:  [18] 18 (12/30 2043) BP: (107-138)/(53-78) 128/68 (12/31 0900) SpO2:  [93 %-100 %] 100 % (12/31 0527)  Weight change:  Filed Weights   05/29/16 1426 05/29/16 1744 05/30/16 0500  Weight: 70.9 kg (156 lb 4.9 oz) 68.8 kg (151 lb 10.8 oz) 69.5 kg (153 lb 4.8 oz)    Intake/Output: I/O last 3 completed shifts: In: 910 [P.O.:860; IV Piggyback:50] Out: 550 [Urine:550]   Intake/Output this shift:  No intake/output data recorded.  Physical Exam: General: NAD  Head: Normocephalic, atraumatic. Moist oral mucosal membranes  Eyes: Anicteric, PERRL  Neck: Supple, trachea midline  Lungs:  Bilateral rhonchi  Heart: Regular rate and rhythm  Abdomen:  Soft, nontender,   Extremities: No peripheral edema.  Neurologic: Nonfocal, moving all four extremities  Skin: No lesions  Access: Left arm AVF    Basic Metabolic Panel:  Recent Labs Lab 05/26/16 1241 05/26/16 1830 05/27/16 0119 05/30/16 0534  NA 134* 130* 136 136  K 5.4* 5.5* 4.0 3.8  CL 94* 93* 97* 98*  CO2 24 24 29 29   GLUCOSE 109* 115* 99 149*  BUN 48* 54* 30* 29*  CREATININE 7.13* 7.47* 4.97* 5.49*  CALCIUM 9.4 8.9 8.7* 8.3*    Liver Function Tests:  Recent Labs Lab 05/26/16 1241 05/26/16 1830  AST 81* 65*  ALT 55 49  ALKPHOS 124 106  BILITOT 0.5 0.9  PROT 7.7 6.9  ALBUMIN 3.9 3.5   No results for input(s): LIPASE, AMYLASE in the last 168 hours. No results for input(s): AMMONIA in the last 168 hours.  CBC:  Recent Labs Lab 05/26/16 1241 05/26/16 1830 05/27/16 0119 05/30/16 0534  WBC 12.7* 11.7* 9.3 3.6*  NEUTROABS 11.7* 10.0*  --   --   HGB 12.2* 11.8* 10.9* 11.3*  HCT 35.6* 33.7* 31.2* 32.7*  MCV 103.9* 103.7* 102.1* 104.3*  PLT 181 171  132* 135*    Cardiac Enzymes:  Recent Labs Lab 05/26/16 1241 05/26/16 1830 05/26/16 2142 05/27/16 0119  TROPONINI 0.07* 0.09* 0.10* 0.09*    BNP: Invalid input(s): POCBNP  CBG:  Recent Labs Lab 05/26/16 1716 05/28/16 0755 05/29/16 0758 05/30/16 0725 05/31/16 0734  GLUCAP 107* 108* 133* 102* 60    Microbiology: Results for orders placed or performed during the hospital encounter of 05/26/16  Blood culture (routine x 2)     Status: None   Collection Time: 05/26/16 12:41 PM  Result Value Ref Range Status   Specimen Description BLOOD RIGHT AC  Final   Special Requests   Final    BOTTLES DRAWN AEROBIC AND ANAEROBIC AER 10ML ANA 17ML   Culture NO GROWTH 5 DAYS  Final   Report Status 05/31/2016 FINAL  Final  Blood culture (routine x 2)     Status: None   Collection Time: 05/26/16 12:41 PM  Result Value Ref Range Status   Specimen Description BLOOD RIGHT AC  Final   Special Requests   Final    BOTTLES DRAWN AEROBIC AND ANAEROBIC AER 14ML ANA 8ML   Culture NO GROWTH 5 DAYS  Final   Report Status 05/31/2016 FINAL  Final  MRSA PCR Screening     Status: Abnormal   Collection Time: 05/26/16  5:23 PM  Result Value Ref Range Status   MRSA by PCR POSITIVE (A) NEGATIVE Final    Comment:        The GeneXpert MRSA Assay (FDA approved for NASAL specimens only), is one component of a comprehensive MRSA colonization surveillance program. It is not intended to diagnose MRSA infection nor to guide or monitor treatment for MRSA infections. RESULT CALLED TO, READ BACK BY AND VERIFIED WITH: PAM CRAWFORD @1912  ON 05/26/2016 JLJ   Culture, sputum-assessment     Status: None   Collection Time: 05/28/16  7:12 AM  Result Value Ref Range Status   Specimen Description SPUTUM  Final   Special Requests NONE  Final   Sputum evaluation THIS SPECIMEN IS ACCEPTABLE FOR SPUTUM CULTURE  Final   Report Status 05/28/2016 FINAL  Final  Culture, respiratory (NON-Expectorated)     Status:  None   Collection Time: 05/28/16  7:12 AM  Result Value Ref Range Status   Specimen Description SPUTUM  Final   Special Requests NONE Reflexed from [id number]  Final   Gram Stain   Final    FEW WBC PRESENT, PREDOMINANTLY PMN RARE SQUAMOUS EPITHELIAL CELLS PRESENT RARE GRAM NEGATIVE COCCOBACILLI RARE GRAM POSITIVE COCCI IN PAIRS    Culture   Final    Consistent with normal respiratory flora. Performed at [facility]    Report Status 05/30/2016 FINAL  Final    Coagulation Studies: No results for input(s): LABPROT, INR in the last 72 hours.  Urinalysis: No results for input(s): COLORURINE, LABSPEC, PHURINE, GLUCOSEU, HGBUR, BILIRUBINUR, KETONESUR, PROTEINUR, UROBILINOGEN, NITRITE, LEUKOCYTESUR in the last 72 hours.  Invalid input(s): APPERANCEUR    Imaging: Dg Chest 2 View  Result Date: 05/30/2016 CLINICAL DATA:  55 year old male with cough and shortness of breath. Dialysis patient. Initial encounter. EXAM: CHEST  2 VIEW COMPARISON:  05/27/2016 and earlier. FINDINGS: Seated AP and lateral views of the chest. Stable cardiomegaly and mediastinal contours. Stable lung volumes. Mild regression of perihilar and basilar pulmonary interstitial opacity. No pleural effusion. No new pulmonary opacity. No pneumothorax. Visualized tracheal air column is within normal limits. No acute osseous abnormality identified. IMPRESSION: Mild/partial regression of pulmonary interstitial opacity, favor improving interstitial edema. Main differential consideration is viral/atypical respiratory infection. Stable cardiomegaly.  No pleural effusion. Electronically Signed   By: Genevie Ann M.D.   On: 05/30/2016 08:49     Medications:    . amLODipine  10 mg Oral Daily  . atenolol  25 mg Oral Daily  . calcium acetate  1,334 mg Oral TID WC  . carbamazepine  1,000 mg Oral q morning - 10a  . Chlorhexidine Gluconate Cloth  6 each Topical Q0600  . cholecalciferol  1,000 Units Oral Daily  . cloNIDine  0.2 mg  Oral TID  . docusate sodium  100 mg Oral BID  . ferrous sulfate  325 mg Oral Daily  . fluPHENAZine  25 mg Oral Daily  . fluticasone furoate-vilanterol  1 puff Inhalation Daily  . heparin  5,000 Units Subcutaneous Q8H  . ipratropium-albuterol  3 mL Nebulization Q6H  . mouth rinse  15 mL Mouth Rinse BID  . minoxidil  5 mg Oral Daily  . mometasone-formoterol  2 puff Inhalation BID  . sodium chloride flush  3 mL Intravenous Q12H  . trihexyphenidyl  5 mg Oral TID WC   acetaminophen **OR** [DISCONTINUED] acetaminophen, albuterol, baclofen, benzonatate, bisacodyl, cyclobenzaprine, guaiFENesin-dextromethorphan, HYDROcodone-acetaminophen, ondansetron **OR** ondansetron (ZOFRAN) IV  Assessment/ Plan:  Todd Brown is a 55 y.o. black  male with schizophrenia, hypertension, hepatitis C, tobacco abuse, ESRD, anemia of CKD, SHPTH   [facility] [address]   1. End stage Renal Disease with hyperkalemia on admission: emergent hemodialysis on admission 12/26 Dialysis for tomorrow.   2. Hypertension: at goal.   - minoxidil, amlodipine, atenolol, clonidine, hydralazine  3. Anemia of chronic kidney disease: hemoglobin at goal. EPO as outpatient. Hold as inpatient.   4. Secondary Hyperparathyroidism: Outpatient PTH, calcium and phosphorus is at goal - calcium acetate with meals  5. Pneumonia with acute asthma/COPD exacerbation: - azithromycin and ceftiraxone.  - Continue oxygen and nebs    LOS: Todd Brown, Todd Brown 12/31/201711:03 AM

## 2016-05-31 NOTE — Progress Notes (Signed)
Patient alert and oriented; denies pain; meds as ordered; discharge this shift as ordered; patient voices understanding of discharge teaching; patient discharged via w/c by staff and family by side.

## 2016-05-31 NOTE — Discharge Instructions (Signed)
Acute Respiratory Failure, Adult °Acute respiratory failure occurs when there is not enough oxygen passing from your lungs to your body. When this happens, your lungs have trouble removing carbon dioxide from the blood. This causes your blood oxygen level to drop too low as carbon dioxide builds up. °Acute respiratory failure is a medical emergency. It can develop quickly, but it is temporary if treated promptly. Your lung capacity, or how much air your lungs can hold, may improve with time, exercise, and treatment. °What are the causes? °There are many possible causes of acute respiratory failure, including: °· Lung injury. °· Chest injury or damage to the ribs or tissues near the lungs. °· Lung conditions that affect the flow of air and blood into and out of the lungs, such as pneumonia, acute respiratory distress syndrome, and cystic fibrosis. °· Medical conditions, such as strokes or spinal cord injuries, that affect the muscles and nerves that control breathing. °· Blood infection (sepsis). °· Inflammation of the pancreas (pancreatitis). °· A blood clot in the lungs (pulmonary embolism). °· A large-volume blood transfusion. °· Burns. °· Near-drowning. °· Seizure. °· Smoke inhalation. °· Reaction to medicines. °· Alcohol or drug overdose. °What increases the risk? °This condition is more likely to develop in people who have: °· A blocked airway. °· Asthma. °· A condition or disease that damages or weakens the muscles, nerves, bones, or tissues that are involved in breathing. °· A serious infection. °· A health problem that blocks the unconscious reflex that is involved in breathing, such as hypothyroidism or sleep apnea. °· A lung injury or trauma. °What are the signs or symptoms? °Trouble breathing is the main symptom of acute respiratory failure. Symptoms may also include: °· Rapid breathing. °· Restlessness or anxiety. °· Skin, lips, or fingernails that appear blue (cyanosis). °· Rapid heart rate. °· Abnormal  heart rhythms (arrhythmias). °· Confusion or changes in behavior. °· Tiredness or loss of energy. °· Feeling sleepy or having a loss of consciousness. °How is this diagnosed? °Your health care provider can diagnose acute respiratory failure with a medical history and physical exam. During the exam, your health care provider will listen to your heart and check for crackling or wheezing sounds in your lungs. Your may also have tests to confirm the diagnosis and determine what is causing respiratory failure. These tests may include: °· Measuring the amount of oxygen in your blood (pulse oximetry). The measurement comes from a small device that is placed on your finger, earlobe, or toe. °· Other blood tests to measure blood gases and to look for signs of infection. °· Sampling your cerebral spinal fluid or tracheal fluid to check for infections. °· Chest X-ray to look for fluid in spaces that should be filled with air. °· Electrocardiogram (ECG) to look at the heart's electrical activity. °How is this treated? °Treatment for this condition usually takes places in a hospital intensive care unit (ICU). Treatment depends on what is causing the condition. It may include one or more treatments until your symptoms improve. Treatment may include: °· Supplemental oxygen. Extra oxygen is given through a tube in the nose, a face mask, or a hood. °· A device such as a continuous positive airway pressure (CPAP) or bi-level positive airway pressure (BiPAP or BPAP) machine. This treatment uses mild air pressure to keep the airways open. A mask or other device will be placed over your nose or mouth. A tube that is connected to a motor will deliver oxygen through the mask. °·   Ventilator. This treatment helps move air into and out of the lungs. This may be done with a bag and mask or a machine. For this treatment, a tube is placed in your windpipe (trachea) so air and oxygen can flow to the lungs.  Extracorporeal membrane oxygenation  (ECMO). This treatment temporarily takes over the function of the heart and lungs, supplying oxygen and removing carbon dioxide. ECMO gives the lungs a chance to recover. It may be used if a ventilator is not effective.  Tracheostomy. This is a procedure that creates a hole in the neck to insert a breathing tube.  Receiving fluids and medicines.  Rocking the bed to help breathing. Follow these instructions at home:  Take over-the-counter and prescription medicines only as told by your health care provider.  Return to normal activities as told by your health care provider. Ask your health care provider what activities are safe for you.  Keep all follow-up visits as told by your health care provider. This is important. How is this prevented? Treating infections and medical conditions that may lead to acute respiratory failure can help prevent the condition from developing. Contact a health care provider if:  You have a fever.  Your symptoms do not improve or they get worse. Get help right away if:  You are having trouble breathing.  You lose consciousness.  Your have cyanosis or turn blue.  You develop a rapid heart rate.  You are confused. These symptoms may represent a serious problem that is an emergency. Do not wait to see if the symptoms will go away. Get medical help right away. Call your local emergency services (911 in the [place] Do not drive yourself to the hospital.  This information is not intended to replace advice given to you by your health care provider. Make sure you discuss any questions you have with your health care provider. Document Released: 05/23/2013 Document Revised: 12/14/2015 Document Reviewed: 12/04/2015 Elsevier Interactive Patient Education  2017 [place].    Continue hemodialysis on Monday, Wednesday and Friday. Outpatient nephrology in 2-3 days Outpatient follow-up with primary care physician in a week Continue to follow-up with ACT  program

## 2016-08-03 ENCOUNTER — Inpatient Hospital Stay
Admission: EM | Admit: 2016-08-03 | Discharge: 2016-08-18 | DRG: 640 | Disposition: A | Discharge status: Home Health | Payer: Medicaid Other | Attending: Internal Medicine | Admitting: Internal Medicine

## 2016-08-03 ENCOUNTER — Emergency Department: Payer: Medicaid Other

## 2016-08-03 ENCOUNTER — Encounter: Payer: Self-pay | Admitting: Emergency Medicine

## 2016-08-03 ENCOUNTER — Inpatient Hospital Stay: Payer: Medicaid Other

## 2016-08-03 DIAGNOSIS — Z992 Dependence on renal dialysis: Secondary | ICD-10-CM

## 2016-08-03 DIAGNOSIS — Z79899 Other long term (current) drug therapy: Secondary | ICD-10-CM | POA: Diagnosis not present

## 2016-08-03 DIAGNOSIS — I468 Cardiac arrest due to other underlying condition: Secondary | ICD-10-CM | POA: Diagnosis present

## 2016-08-03 DIAGNOSIS — E785 Hyperlipidemia, unspecified: Secondary | ICD-10-CM | POA: Diagnosis present

## 2016-08-03 DIAGNOSIS — I469 Cardiac arrest, cause unspecified: Secondary | ICD-10-CM | POA: Diagnosis not present

## 2016-08-03 DIAGNOSIS — I351 Nonrheumatic aortic (valve) insufficiency: Secondary | ICD-10-CM | POA: Diagnosis not present

## 2016-08-03 DIAGNOSIS — E11649 Type 2 diabetes mellitus with hypoglycemia without coma: Secondary | ICD-10-CM | POA: Diagnosis present

## 2016-08-03 DIAGNOSIS — M7989 Other specified soft tissue disorders: Secondary | ICD-10-CM

## 2016-08-03 DIAGNOSIS — I272 Pulmonary hypertension, unspecified: Secondary | ICD-10-CM | POA: Diagnosis present

## 2016-08-03 DIAGNOSIS — I10 Essential (primary) hypertension: Secondary | ICD-10-CM | POA: Diagnosis not present

## 2016-08-03 DIAGNOSIS — B1920 Unspecified viral hepatitis C without hepatic coma: Secondary | ICD-10-CM | POA: Diagnosis present

## 2016-08-03 DIAGNOSIS — R0602 Shortness of breath: Secondary | ICD-10-CM

## 2016-08-03 DIAGNOSIS — I5033 Acute on chronic diastolic (congestive) heart failure: Secondary | ICD-10-CM | POA: Diagnosis present

## 2016-08-03 DIAGNOSIS — J9600 Acute respiratory failure, unspecified whether with hypoxia or hypercapnia: Secondary | ICD-10-CM | POA: Diagnosis present

## 2016-08-03 DIAGNOSIS — Z4659 Encounter for fitting and adjustment of other gastrointestinal appliance and device: Secondary | ICD-10-CM

## 2016-08-03 DIAGNOSIS — I361 Nonrheumatic tricuspid (valve) insufficiency: Secondary | ICD-10-CM | POA: Diagnosis not present

## 2016-08-03 DIAGNOSIS — G931 Anoxic brain damage, not elsewhere classified: Secondary | ICD-10-CM | POA: Diagnosis present

## 2016-08-03 DIAGNOSIS — E875 Hyperkalemia: Principal | ICD-10-CM

## 2016-08-03 DIAGNOSIS — I701 Atherosclerosis of renal artery: Secondary | ICD-10-CM | POA: Diagnosis present

## 2016-08-03 DIAGNOSIS — I132 Hypertensive heart and chronic kidney disease with heart failure and with stage 5 chronic kidney disease, or end stage renal disease: Secondary | ICD-10-CM | POA: Diagnosis present

## 2016-08-03 DIAGNOSIS — J810 Acute pulmonary edema: Secondary | ICD-10-CM | POA: Diagnosis not present

## 2016-08-03 DIAGNOSIS — I82621 Acute embolism and thrombosis of deep veins of right upper extremity: Secondary | ICD-10-CM | POA: Diagnosis present

## 2016-08-03 DIAGNOSIS — Z23 Encounter for immunization: Secondary | ICD-10-CM

## 2016-08-03 DIAGNOSIS — F17210 Nicotine dependence, cigarettes, uncomplicated: Secondary | ICD-10-CM | POA: Diagnosis present

## 2016-08-03 DIAGNOSIS — R4020 Unspecified coma: Secondary | ICD-10-CM | POA: Diagnosis present

## 2016-08-03 DIAGNOSIS — F209 Schizophrenia, unspecified: Secondary | ICD-10-CM | POA: Diagnosis present

## 2016-08-03 DIAGNOSIS — D7589 Other specified diseases of blood and blood-forming organs: Secondary | ICD-10-CM | POA: Diagnosis present

## 2016-08-03 DIAGNOSIS — T82868A Thrombosis of vascular prosthetic devices, implants and grafts, initial encounter: Secondary | ICD-10-CM | POA: Diagnosis not present

## 2016-08-03 DIAGNOSIS — D631 Anemia in chronic kidney disease: Secondary | ICD-10-CM | POA: Diagnosis present

## 2016-08-03 DIAGNOSIS — N186 End stage renal disease: Secondary | ICD-10-CM | POA: Diagnosis present

## 2016-08-03 DIAGNOSIS — Z9911 Dependence on respirator [ventilator] status: Secondary | ICD-10-CM | POA: Diagnosis not present

## 2016-08-03 DIAGNOSIS — J9690 Respiratory failure, unspecified, unspecified whether with hypoxia or hypercapnia: Secondary | ICD-10-CM

## 2016-08-03 DIAGNOSIS — E1122 Type 2 diabetes mellitus with diabetic chronic kidney disease: Secondary | ICD-10-CM | POA: Diagnosis present

## 2016-08-03 DIAGNOSIS — N2581 Secondary hyperparathyroidism of renal origin: Secondary | ICD-10-CM | POA: Diagnosis present

## 2016-08-03 DIAGNOSIS — E119 Type 2 diabetes mellitus without complications: Secondary | ICD-10-CM | POA: Diagnosis not present

## 2016-08-03 LAB — CBC WITH DIFFERENTIAL/PLATELET
Basophils Absolute: 0 10*3/uL (ref 0–0.1)
Basophils Relative: 0 %
EOS ABS: 0 10*3/uL (ref 0–0.7)
EOS PCT: 0 %
HCT: 37.1 % — ABNORMAL LOW (ref 40.0–52.0)
Hemoglobin: 12.7 g/dL — ABNORMAL LOW (ref 13.0–18.0)
LYMPHS ABS: 0.8 10*3/uL — AB (ref 1.0–3.6)
Lymphocytes Relative: 8 %
MCH: 36.6 pg — AB (ref 26.0–34.0)
MCHC: 34.2 g/dL (ref 32.0–36.0)
MCV: 107 fL — ABNORMAL HIGH (ref 80.0–100.0)
Monocytes Absolute: 0.5 10*3/uL (ref 0.2–1.0)
Monocytes Relative: 5 %
Neutro Abs: 7.9 10*3/uL — ABNORMAL HIGH (ref 1.4–6.5)
Neutrophils Relative %: 87 %
PLATELETS: 282 10*3/uL (ref 150–440)
RBC: 3.47 MIL/uL — AB (ref 4.40–5.90)
RDW: 16.3 % — ABNORMAL HIGH (ref 11.5–14.5)
WBC: 9.2 10*3/uL (ref 3.8–10.6)

## 2016-08-03 LAB — COMPREHENSIVE METABOLIC PANEL
ALT: 47 U/L (ref 17–63)
ANION GAP: 10 (ref 5–15)
AST: 49 U/L — ABNORMAL HIGH (ref 15–41)
Albumin: 3.9 g/dL (ref 3.5–5.0)
Alkaline Phosphatase: 120 U/L (ref 38–126)
BUN: 54 mg/dL — ABNORMAL HIGH (ref 6–20)
CO2: 20 mmol/L — AB (ref 22–32)
Calcium: 9.1 mg/dL (ref 8.9–10.3)
Chloride: 98 mmol/L — ABNORMAL LOW (ref 101–111)
Creatinine, Ser: 7.9 mg/dL — ABNORMAL HIGH (ref 0.61–1.24)
GFR calc non Af Amer: 7 mL/min — ABNORMAL LOW (ref >60)
GFR, EST AFRICAN AMERICAN: 8 mL/min — AB (ref >60)
Glucose, Bld: 108 mg/dL — ABNORMAL HIGH (ref 65–99)
Potassium: >7.5 mmol/L (ref 3.5–5.1)
Sodium: 128 mmol/L — ABNORMAL LOW (ref 135–145)
Total Bilirubin: 0.7 mg/dL (ref 0.3–1.2)
Total Protein: 7.1 g/dL (ref 6.5–8.1)

## 2016-08-03 LAB — BLOOD GAS, ARTERIAL
ACID-BASE EXCESS: 2.9 mmol/L — AB (ref 0.0–2.0)
ALLENS TEST (PASS/FAIL): POSITIVE — AB
Acid-Base Excess: 8.8 mmol/L — ABNORMAL HIGH (ref 0.0–2.0)
BICARBONATE: 28.5 mmol/L — AB (ref 20.0–28.0)
BICARBONATE: 34.1 mmol/L — AB (ref 20.0–28.0)
FIO2: 0.8
FIO2: 0.6
LHR: 20 {breaths}/min
MECHANICAL RATE: 20
MECHVT: 450 mL
O2 Saturation: 98.2 %
O2 Saturation: 97.6 %
PATIENT TEMPERATURE: 37
PCO2 ART: 47 mmHg (ref 32.0–48.0)
PEEP: 5 cmH2O
PEEP: 5 cmH2O
PH ART: 7.39 (ref 7.350–7.450)
PH ART: 7.45 (ref 7.350–7.450)
Patient temperature: 37
VT: 450 mL
pCO2 arterial: 49 mmHg — ABNORMAL HIGH (ref 32.0–48.0)
pO2, Arterial: 108 mmHg (ref 83.0–108.0)
pO2, Arterial: 93 mmHg (ref 83.0–108.0)

## 2016-08-03 LAB — LACTIC ACID, PLASMA: LACTIC ACID, VENOUS: 1.6 mmol/L (ref 0.5–1.9)

## 2016-08-03 LAB — TROPONIN I
TROPONIN I: 0.84 ng/mL — AB (ref <0.03)
Troponin I: <0.03 ng/mL (ref <0.03)
Troponin I: 0.21 ng/mL (ref <0.03)

## 2016-08-03 LAB — TRIGLYCERIDES: TRIGLYCERIDES: 106 mg/dL (ref <150)

## 2016-08-03 LAB — GLUCOSE, CAPILLARY
GLUCOSE-CAPILLARY: 93 mg/dL (ref 65–99)
GLUCOSE-CAPILLARY: 89 mg/dL (ref 65–99)
Glucose-Capillary: 98 mg/dL (ref 65–99)

## 2016-08-03 LAB — POTASSIUM
POTASSIUM: 6.1 mmol/L — AB (ref 3.5–5.1)
Potassium: 3.8 mmol/L (ref 3.5–5.1)

## 2016-08-03 LAB — MRSA PCR SCREENING: MRSA by PCR: NEGATIVE

## 2016-08-03 LAB — MAGNESIUM: MAGNESIUM: 1.9 mg/dL (ref 1.7–2.4)

## 2016-08-03 MED ORDER — PNEUMOCOCCAL VAC POLYVALENT 25 MCG/0.5ML IJ INJ
0.5000 mL | INJECTION | INTRAMUSCULAR | Status: AC
  Administered 2016-08-18: 0.5 mL via INTRAMUSCULAR
  Filled 2016-08-03: qty 0.5

## 2016-08-03 MED ORDER — FENTANYL CITRATE (PF) 100 MCG/2ML IJ SOLN
50.0000 ug | Freq: Once | INTRAMUSCULAR | Status: AC
  Administered 2016-08-03: 50 ug via INTRAVENOUS

## 2016-08-03 MED ORDER — SODIUM CHLORIDE 0.9 % IV SOLN: INTRAVENOUS | Status: DC

## 2016-08-03 MED ORDER — AMLODIPINE BESYLATE 5 MG PO TABS: 10.0000 mg | ORAL_TABLET | Freq: Every day | ORAL | Status: DC

## 2016-08-03 MED ORDER — SODIUM BICARBONATE 8.4 % IV SOLN
INTRAVENOUS | Status: AC | PRN
  Administered 2016-08-03 (×2): 50 meq via INTRAVENOUS

## 2016-08-03 MED ORDER — HEPARIN SODIUM (PORCINE) 5000 UNIT/ML IJ SOLN
5000.0000 [IU] | Freq: Three times a day (TID) | INTRAMUSCULAR | Status: DC
  Administered 2016-08-03 – 2016-08-07 (×11): 5000 [IU] via SUBCUTANEOUS
  Filled 2016-08-03 (×11): qty 1

## 2016-08-03 MED ORDER — INSULIN ASPART 100 UNIT/ML ~~LOC~~ SOLN: 0.0000 [IU] | SUBCUTANEOUS | Status: DC

## 2016-08-03 MED ORDER — SODIUM BICARBONATE 8.4 % IV SOLN
50.0000 meq | Freq: Once | INTRAVENOUS | Status: AC
Start: 2016-08-03 — End: 2016-08-03
  Administered 2016-08-03: 50 meq via INTRAVENOUS

## 2016-08-03 MED ORDER — ONDANSETRON HCL 4 MG/2ML IJ SOLN
INTRAMUSCULAR | Status: AC
  Filled 2016-08-03: qty 4

## 2016-08-03 MED ORDER — SODIUM BICARBONATE 8.4 % IV SOLN
50.0000 meq | Freq: Once | INTRAVENOUS | Status: AC
  Administered 2016-08-03: 50 meq via INTRAVENOUS

## 2016-08-03 MED ORDER — SODIUM CHLORIDE 0.9 % IV SOLN
1.0000 g | Freq: Once | INTRAVENOUS | Status: DC
  Filled 2016-08-03: qty 10

## 2016-08-03 MED ORDER — PROPOFOL 1000 MG/100ML IV EMUL
5.0000 ug/kg/min | INTRAVENOUS | Status: DC
  Administered 2016-08-03 – 2016-08-04 (×4): 30 ug/kg/min via INTRAVENOUS
  Filled 2016-08-03 (×3): qty 100

## 2016-08-03 MED ORDER — MIDAZOLAM HCL 2 MG/2ML IJ SOLN
INTRAMUSCULAR | Status: AC
Start: 2016-08-03 — End: 2016-08-04
  Filled 2016-08-03: qty 2

## 2016-08-03 MED ORDER — ATROPINE SULFATE 1 MG/ML IJ SOLN
INTRAMUSCULAR | Status: AC | PRN
  Administered 2016-08-03: 1 mg via INTRAVENOUS

## 2016-08-03 MED ORDER — SODIUM CHLORIDE 0.9 % IV SOLN: 250.0000 mL | INTRAVENOUS | Status: DC | PRN

## 2016-08-03 MED ORDER — FENTANYL CITRATE (PF) 100 MCG/2ML IJ SOLN
INTRAMUSCULAR | Status: AC
  Filled 2016-08-03: qty 2

## 2016-08-03 MED ORDER — MIDAZOLAM HCL 2 MG/2ML IJ SOLN
2.0000 mg | Freq: Once | INTRAMUSCULAR | Status: AC
  Administered 2016-08-03: 2 mg via INTRAVENOUS

## 2016-08-03 MED ORDER — CALCIUM CHLORIDE 10 % IV SOLN
1.0000 g | Freq: Once | INTRAVENOUS | Status: AC
  Administered 2016-08-03: 1 g via INTRAVENOUS
  Filled 2016-08-03: qty 10

## 2016-08-03 MED ORDER — HYDRALAZINE HCL 20 MG/ML IJ SOLN: 10.0000 mg | INTRAMUSCULAR | Status: DC | PRN

## 2016-08-03 MED ORDER — EPINEPHRINE PF 1 MG/10ML IJ SOSY
PREFILLED_SYRINGE | INTRAMUSCULAR | Status: AC | PRN
  Administered 2016-08-03 (×6): 1 mg via INTRAVENOUS

## 2016-08-03 MED ORDER — DEXTROSE 5 % IV SOLN
INTRAVENOUS | Status: AC | PRN
  Administered 2016-08-03: 300 mg via INTRAVENOUS

## 2016-08-03 MED ORDER — CALCIUM CHLORIDE 10 % IV SOLN
INTRAVENOUS | Status: AC | PRN
  Administered 2016-08-03 (×4): 1 g via INTRAVENOUS

## 2016-08-03 MED ORDER — ATENOLOL 25 MG PO TABS: 25.0000 mg | ORAL_TABLET | Freq: Two times a day (BID) | ORAL | Status: DC

## 2016-08-03 MED ORDER — PANTOPRAZOLE SODIUM 40 MG IV SOLR
40.0000 mg | Freq: Every day | INTRAVENOUS | Status: DC
  Administered 2016-08-03: 40 mg via INTRAVENOUS
  Filled 2016-08-03: qty 40

## 2016-08-03 MED ORDER — CARBAMAZEPINE 200 MG PO TABS
500.0000 mg | ORAL_TABLET | Freq: Two times a day (BID) | ORAL | Status: DC
  Administered 2016-08-03 – 2016-08-04 (×2): 500 mg
  Filled 2016-08-03 (×2): qty 3

## 2016-08-03 MED ORDER — CHLORHEXIDINE GLUCONATE 0.12% ORAL RINSE (MEDLINE KIT)
15.0000 mL | Freq: Two times a day (BID) | OROMUCOSAL | Status: DC
  Administered 2016-08-03 – 2016-08-04 (×2): 15 mL via OROMUCOSAL

## 2016-08-03 MED ORDER — INFLUENZA VAC SPLIT QUAD 0.5 ML IM SUSY
0.5000 mL | PREFILLED_SYRINGE | INTRAMUSCULAR | Status: AC
  Administered 2016-08-18: 0.5 mL via INTRAMUSCULAR
  Filled 2016-08-03: qty 0.5

## 2016-08-03 MED ORDER — CLONIDINE HCL 0.1 MG PO TABS
0.2000 mg | ORAL_TABLET | Freq: Three times a day (TID) | ORAL | Status: DC
  Administered 2016-08-03 – 2016-08-04 (×3): 0.2 mg
  Filled 2016-08-03 (×3): qty 2

## 2016-08-03 MED ORDER — IPRATROPIUM-ALBUTEROL 0.5-2.5 (3) MG/3ML IN SOLN: 3.0000 mL | RESPIRATORY_TRACT | Status: DC | PRN

## 2016-08-03 MED ORDER — ORAL CARE MOUTH RINSE
15.0000 mL | OROMUCOSAL | Status: DC
  Administered 2016-08-03 – 2016-08-04 (×6): 15 mL via OROMUCOSAL

## 2016-08-03 NOTE — Progress Notes (Signed)
[place] Progress Note Patient Name: Todd Brown DOB: 09/11/1960 MRN: [id number]   Date of Service  08/03/2016  HPI/Events of Note  abdo film reviewed   eICU Interventions  Advance ogt, d/w rn     Intervention Category Minor Interventions: Routine modifications to care plan (e.g. PRN medications for pain, fever)  Raylene Miyamoto. 08/03/2016, 5:16 PM

## 2016-08-03 NOTE — Code Documentation (Signed)
Intubated ETT 8.0 21 cm at lip.  + color change on CO2 detector.

## 2016-08-03 NOTE — Progress Notes (Signed)
Subjective:   Patient known to our practice from outpatient dialysis. He dialyzes at have a low dialysis Center and is followed by Dr. Holley Raring. Patient presented from home via EMS. Party report, he may have missed dialysis on Friday therefore his last dialysis was on Wednesday of last week In the ER,he was noted to have a potassium greater than 7.5.  His cardiac rhythm was abnormal, wide complex.  He required ACLS support.  He was intubated and placed on ventilator. He was immediately transferred to ICU and emergent dialysis was requested     Objective:  Vital signs in last 24 hours:  Temp:  [97.8 F (36.6 C)-98.1 F (36.7 C)] 97.8 F (36.6 C) (03/05 1532) Pulse Rate:  [48-70] 62 (03/05 1900) Resp:  [16-31] 17 (03/05 1900) BP: (95-263)/(23-244) 151/84 (03/05 1900) SpO2:  [62 %-100 %] 98 % (03/05 1900) FiO2 (%):  [80 %-100 %] 80 % (03/05 1534) Weight:  [81.6 kg (180 lb)-88 kg (194 lb 0.1 oz)] 88 kg (194 lb 0.1 oz) (03/05 1515)  Weight change:  Filed Weights   08/03/16 1307 08/03/16 1515  Weight: 81.6 kg (180 lb) 88 kg (194 lb 0.1 oz)    Intake/Output:    Intake/Output Summary (Last 24 hours) at 08/03/16 1906 Last data filed at 08/03/16 1608  Gross per 24 hour  Intake            12.99 ml  Output                0 ml  Net            12.99 ml     Physical Exam: General: Critically ill appearing  HEENT Anicteric, ET tube in place  Neck supple  Pulm/lungs Coarse breath sound bilaterally, ventilatory assisted  CVS/Heart irregular rhythm, alternating irregular and normal sinus  Abdomen:  Soft, nontender, nondistended  Extremities: Trace edema  Neurologic: sedated  Skin: Dry skin  Access: Left upper arm AV fistula       Basic Metabolic Panel:   Recent Labs Lab 08/03/16 1316 08/03/16 1628  NA 128*  --   K >7.5* 6.1*  CL 98*  --   CO2 20*  --   GLUCOSE 108*  --   BUN 54*  --   CREATININE 7.90*  --   CALCIUM 9.1  --      CBC:  Recent Labs Lab  08/03/16 1316  WBC 9.2  NEUTROABS 7.9*  HGB 12.7*  HCT 37.1*  MCV 107.0*  PLT 282      Microbiology:  No results found for this or any previous visit (from the past 720 hour(s)).  Coagulation Studies: No results for input(s): LABPROT, INR in the last 72 hours.  Urinalysis: No results for input(s): COLORURINE, LABSPEC, PHURINE, GLUCOSEU, HGBUR, BILIRUBINUR, KETONESUR, PROTEINUR, UROBILINOGEN, NITRITE, LEUKOCYTESUR in the last 72 hours.  Invalid input(s): APPERANCEUR    Imaging: Dg Abd 1 View  Result Date: 08/03/2016 CLINICAL DATA:  NG tube placement. EXAM: ABDOMEN - 1 VIEW COMPARISON:  None. FINDINGS: Side port of the NG tube is just above the GE junction and could be advanced for more optimal positioning. There is mild distention of the stomach. Bowel gas pattern is otherwise normal. IMPRESSION: Side port of the NG tube terminates just above the stomach. Electronically Signed   By: San Morelle M.D.   On: 08/03/2016 17:15   Dg Chest Portable 1 View  Result Date: 08/03/2016 CLINICAL DATA:  Weak, post intubation EXAM: PORTABLE CHEST 1  VIEW COMPARISON:  05/30/2016 FINDINGS: Pacer patches obscure portions of the chest. Endotracheal tube tip is at the thoracic inlet. There is mild cardiomegaly. Diffuse mildly reticular interstitial opacities are present, which may reflect pulmonary edema or possible infection. No large effusion. No focal consolidation. No pneumothorax. IMPRESSION: 1. Endotracheal tube tip is at the thoracic inlet 2. Cardiomegaly. Diffuse mildly reticular interstitial opacities suggestive of pulmonary edema. Electronically Signed   By: Donavan Foil M.D.   On: 08/03/2016 14:34     Medications:   . propofol (DIPRIVAN) infusion 30 mcg/kg/min (08/03/16 1515)   . calcium chloride  1 g Intravenous Once  . carbamazepine  500 mg Per Tube BID  . cloNIDine  0.2 mg Per Tube TID  . fentaNYL      . heparin  5,000 Units Subcutaneous Q8H  . insulin aspart  0-15 Units  Subcutaneous Q4H  . midazolam      . pantoprazole (PROTONIX) IV  40 mg Intravenous QHS   sodium chloride, ipratropium-albuterol  Assessment/ Plan:  56 y.o.african American male  With schizophrenia, hypertension, hepatitis C, tobacco abuse, anemia of chronic kidney disease, secondary hyperparathyroidism, end-stage renal disease   [facility] [place].   1.  End-stage renal disease 2.  Severe hyperkalemia, life threatening 3.  Acute respiratory failure, now ventilatory assisted 4. Anemia of chronic kidney disease.  Patient presents with critical hyperkalemia as he may have missed his outpatient dialysis treatments. He required IV administration of calcium and bicarbonate in the ICU prior to starting dialysis to stabilize his cardiac rhythm We'll arrange for emergent dialysis.  We will dialyze him with 1K bath We will monitor his intradialytic potassium BMP in morning to assess need for further dialysis tomorrow   LOS: 0 Valon Glasscock 3/5/20187:06 PM

## 2016-08-03 NOTE — ED Triage Notes (Signed)
Patient from home via [facility]. Reports he was supposed to have dialysis this morning but when he went to get up from bed, he was too weak to walk and fell.Upon arrival patient, patient has weakness in all 4 extremities and is unable to hold them up for an extended amount of time. Patient is alert and oriented x4.

## 2016-08-03 NOTE — Code Documentation (Signed)
Pulse present

## 2016-08-03 NOTE — Progress Notes (Signed)
Post HD assessment. HR 67 NSR. BP 145/85, RR 18, sats 98% on vent.

## 2016-08-03 NOTE — Progress Notes (Signed)
[place] Progress Note Patient Name: Todd Brown DOB: 26-Jun-1960 MRN: [id number]   Date of Service  08/03/2016  HPI/Events of Note  New assessment Pt esrd, hyperK s/p arrest On rate 20  Calcium was given Now on emergent HD  eICU Interventions  Repeat calcium given as short half life in [place] setting May need bicarb further Will get STAT abg to esnure correction Ph in this setting I camera in and discussed plan with RN     Intervention Category Major Interventions: Acid-Base disturbance - evaluation and management Evaluation Type: New Patient Evaluation  Raylene Miyamoto. 08/03/2016, 3:57 PM

## 2016-08-03 NOTE — Progress Notes (Signed)
Pre HD assessment  

## 2016-08-03 NOTE — Progress Notes (Signed)
[place] Progress Note Patient Name: Todd Brown DOB: 01-13-1961 MRN: [id number]   Date of Service  08/03/2016  HPI/Events of Note  asymptomatic brady k benig treated on HD BP excellent  eICU Interventions  Dc all antiHTN or drugs to drop HR paer pads on chest  If drops BP will add bicarb despite HD Add dopmaine at beta afect     Intervention Category Major Interventions: Arrhythmia - evaluation and management  Raylene Miyamoto. 08/03/2016, 4:37 PM

## 2016-08-03 NOTE — Progress Notes (Signed)
Pt. Was transported to CCU from the ED while on the vent.

## 2016-08-03 NOTE — Code Documentation (Signed)
defib 200 j

## 2016-08-03 NOTE — Code Documentation (Addendum)
vfib-- defib 200 j

## 2016-08-03 NOTE — Progress Notes (Signed)
HD initiated emergently at bedside in ICU. Pt on vent, sedated. Received calcium prior to initiation. Dr. Candiss Norse at bedside. Emergency HD consent obtained from MD. HR 51,  1K bath. Continue to monitor.

## 2016-08-03 NOTE — Progress Notes (Signed)
HD completed. UF=12ml as ordered. Patient remains sedated on ventilator. Recheck potassium level sent to lab. Report given to primary RN.

## 2016-08-03 NOTE — Progress Notes (Signed)
Notified by Dr Joni Fears about severe Hyperkalemia and cardiac arrest Will dialyze patient as soon as patient in room

## 2016-08-03 NOTE — Progress Notes (Signed)
NSR on monitor. HR 65. BP 133/91, RR 20.

## 2016-08-03 NOTE — Progress Notes (Signed)
MEDICATION RELATED CONSULT NOTE - INITIAL   Pharmacy Consult for Renal adjustment of antibiotics Indication: renal adjustment   Allergies  Allergen Reactions  . Thorazine [Chlorpromazine] Other (See Comments)    Reaction:  Unknown , pt states it makes him feel real bad    Patient Measurements: Height: 6\' 3"  (190.5 cm) Weight: 180 lb (81.6 kg) IBW/kg (Calculated) : 84.5 Adjusted Body Weight:   Vital Signs: Temp: 98.1 F (36.7 C) (03/05 1307) Temp Source: Oral (03/05 1307) BP: 102/67 (03/05 1307) Pulse Rate: 52 (03/05 1307) Intake/Output from previous day: No intake/output data recorded. Intake/Output from this shift: No intake/output data recorded.  Labs:  Recent Labs  08/03/16 1316  WBC 9.2  HGB 12.7*  HCT 37.1*  PLT 282  CREATININE 7.90*  ALBUMIN 3.9  PROT 7.1  AST 49*  ALT 47  ALKPHOS 120  BILITOT 0.7   Estimated Creatinine Clearance: 12.2 mL/min (by C-G formula based on SCr of 7.9 mg/dL (H)).   Microbiology: No results found for this or any previous visit (from the past 720 hour(s)).  Medical History: Past Medical History:  Diagnosis Date  . Anemia   . Diabetes mellitus without complication ([place])   . Dialysis patient [facility])    Mon. -Wed.- Fri  . ESRD (end stage renal disease) ([place])   . Hyperlipidemia   . Malignant hypertension   . Renal artery stenosis ([place])   . Schizophrenia ([place])     Medications:   (Not in a hospital admission) Scheduled:  . amLODipine  10 mg Per Tube Daily  . [START ON 08/04/2016] atenolol  25 mg Per Tube BID  . carbamazepine  500 mg Per Tube BID  . cloNIDine  0.2 mg Per Tube TID  . fentaNYL      . heparin  5,000 Units Subcutaneous Q8H  . midazolam      . ondansetron      . pantoprazole (PROTONIX) IV  40 mg Intravenous QHS    Assessment: Pharmacy consulted to renally adjust antibiotics in this critically ill patient.  Goal of Therapy:    Plan:  Currently, no antimicrobials are ordered. Pharmacy will follow and  renally adjust antimicrobials as necessary.   Teana Lindahl D 08/03/2016,2:40 PM

## 2016-08-03 NOTE — Code Documentation (Signed)
DR. Joni Fears evaluating cardiac function with ultrasound.

## 2016-08-03 NOTE — Code Documentation (Signed)
Lost pulse, resume CPR

## 2016-08-03 NOTE — Code Documentation (Signed)
defib 200 J

## 2016-08-03 NOTE — Progress Notes (Signed)
Ok to used OG tube per MD Titus Mould. Will continue to monitor patient.

## 2016-08-03 NOTE — ED Provider Notes (Addendum)
[facility] Emergency Department Provider Note  ____________________________________________  Time seen: Approximately 2:11 PM  I have reviewed the triage vital signs and the nursing notes.   HISTORY  Chief Complaint Weakness   Level 5 caveat:  Portions of the history and physical were unable to be obtained due to the patient's acute illness  HPI Todd Brown is a 56 y.o. male who complains of generalized weakness. EMS report he missed his dialysis session 3 days ago, was dew today but came here instead. Had stool incontinence at home. Patient reports he fell one time at home due to weakness.     Past Medical History:  Diagnosis Date  . Anemia   . Diabetes mellitus without complication ([place])   . Dialysis patient [facility])    Mon. -Wed.- Fri  . ESRD (end stage renal disease) ([place])   . Hyperlipidemia   . Malignant hypertension   . Renal artery stenosis ([place])   . Schizophrenia [facility])      Patient Active Problem List   Diagnosis Date Noted  . Sepsis ([place]) 05/26/2016  . Dialysis patient ([place]) 04/15/2016  . End stage renal disease ([place]) 02/12/2016  . Hyperkalemia 05/20/2015  . Hepatitis C 09/26/2013  . Schizophrenia ([place]) 03/23/2013  . Essential hypertension, benign 03/23/2013  . Chronic kidney disease 03/23/2013     Past Surgical History:  Procedure Laterality Date  . AV FISTULA PLACEMENT Left 12/26/2014   Procedure: ARTERIOVENOUS (AV) FISTULA CREATION;  Surgeon: Algernon Huxley, MD;  Location: [facility] ORS;  Service: Vascular;  Laterality: Left;  . INSERTION OF DIALYSIS CATHETER Right   . PERIPHERAL VASCULAR CATHETERIZATION N/A 10/22/2014   Procedure: Dialysis/Perma Catheter Insertion;  Surgeon: Algernon Huxley, MD;  Location: [place] CV LAB;  Service: Cardiovascular;  Laterality: N/A;  . PERIPHERAL VASCULAR CATHETERIZATION N/A 02/28/2015   Procedure: Dialysis/Perma Catheter Removal;  Surgeon: Algernon Huxley, MD;  Location: [place] CV LAB;   Service: Cardiovascular;  Laterality: N/A;  . Repair fx left lower leg     yrs ago (age 26)     Prior to Admission medications   Medication Sig Start Date End Date Taking? Authorizing Provider  amLODipine (NORVASC) 10 MG tablet Take 10 mg by mouth daily.   Yes Historical Provider, MD  atenolol (TENORMIN) 25 MG tablet Take 25 mg by mouth 2 (two) times daily.    Yes Historical Provider, MD  b complex-vitamin c-folic acid (NEPHRO-VITE) 0.8 MG TABS tablet Take 1 tablet by mouth daily.   Yes Historical Provider, MD  calcium acetate (PHOSLO) 667 MG capsule Take 2 capsules (1,334 mg total) by mouth 3 (three) times daily with meals. 05/19/15  Yes Fritzi Mandes, MD  carbamazepine (TEGRETOL XR) 200 MG 12 hr tablet Take 1,000 mg by mouth every morning.   Yes Historical Provider, MD  cholecalciferol (VITAMIN D) 1000 UNITS tablet Take 1,000 Units by mouth daily.   Yes Historical Provider, MD  citalopram (CELEXA) 20 MG tablet Take 1 tablet (20 mg total) by mouth daily. 05/19/15  Yes Fritzi Mandes, MD  cloNIDine (CATAPRES) 0.2 MG tablet Take 1 tablet (0.2 mg total) by mouth 3 (three) times daily. 05/19/15  Yes Fritzi Mandes, MD  ferrous sulfate 325 (65 FE) MG tablet Take 1 tablet (325 mg total) by mouth daily. 05/19/15  Yes Fritzi Mandes, MD  fluPHENAZine (PROLIXIN) 2.5 MG tablet Take 2.5 mg by mouth daily.    Yes Historical Provider, MD  Fluticasone-Salmeterol (ADVAIR DISKUS) 250-50 MCG/DOSE AEPB Inhale 1 puff  into the lungs 2 (two) times daily.   Yes Historical Provider, MD  hydrALAZINE (APRESOLINE) 100 MG tablet Take 1 tablet (100 mg total) by mouth every 6 (six) hours. Patient taking differently: Take 100 mg by mouth 2 (two) times daily.  05/19/15  Yes Fritzi Mandes, MD  minoxidil (LONITEN) 2.5 MG tablet Take 2 tablets (5 mg total) by mouth daily. 05/19/15  Yes Fritzi Mandes, MD  trihexyphenidyl (ARTANE) 5 MG tablet Take 5 mg by mouth 3 (three) times daily with meals.   Yes Historical Provider, MD  acetaminophen (TYLENOL)  500 MG tablet Take 500 mg by mouth every 4 (four) hours as needed for mild pain. Take 1 tablet every 4-6 hours as needed for pain.    Historical Provider, MD  albuterol (PROAIR HFA) 108 (90 Base) MCG/ACT inhaler Inhale 1-2 puffs into the lungs every 6 (six) hours as needed for wheezing or shortness of breath (cough).    Historical Provider, MD  amoxicillin-clavulanate (AUGMENTIN) 500-125 MG tablet Take 1 tablet (500 mg total) by mouth 3 (three) times daily. 05/31/16   [name], MD  baclofen (LIORESAL) 10 MG tablet Take 1 tablet (10 mg total) by mouth 2 (two) times daily as needed for muscle spasms. 05/19/15   Fritzi Mandes, MD  benzonatate (TESSALON) 200 MG capsule Take 1 capsule (200 mg total) by mouth 2 (two) times daily as needed for cough. 05/31/16   [name], MD  calcium acetate (PHOSLO) 667 MG capsule Take 1,334 mg by mouth 3 (three) times daily with meals.     Historical Provider, MD  carbamazepine (TEGRETOL) 200 MG tablet Take 2.5 tablets (500 mg total) by mouth 2 (two) times daily. Taken 2 1/2 tabs (500mg  ) twice a day Patient not taking: Reported on 04/14/2016 10/31/14   Gladstone Lighter, MD  cyclobenzaprine (FLEXERIL) 5 MG tablet Take 5 mg by mouth at bedtime as needed for muscle spasms.    Historical Provider, MD  fluticasone furoate-vilanterol (BREO ELLIPTA) 100-25 MCG/INH AEPB Inhale 1 puff into the lungs daily.    Historical Provider, MD  traMADol (ULTRAM) 50 MG tablet Take 1 tablet (50 mg total) by mouth every 6 (six) hours as needed. 05/19/15   Fritzi Mandes, MD     Allergies Thorazine [chlorpromazine]   Family History  Problem Relation Age of Onset  . Diabetes Neg Hx     Social History Social History  Substance Use Topics  . Smoking status: Current Every Day Smoker    Packs/day: 0.50    Types: Cigarettes  . Smokeless tobacco: Never Used     Comment: 10 cigars a day  . Alcohol use No    Review of Systems  Constitutional:   No fever or chills.Positive generalized  weakness  ENT:   No sore throat. No rhinorrhea. Cardiovascular:   No chest pain. Respiratory:   No dyspnea or cough. Gastrointestinal:   Negative for abdominal pain, vomiting positive diarrhea.  Genitourinary:   Negative for dysuria or difficulty urinating. Musculoskeletal:   Negative for focal pain or swelling Neurological:   Negative for headaches 10-point ROS otherwise negative.  ____________________________________________   PHYSICAL EXAM:  VITAL SIGNS: ED Triage Vitals [08/03/16 1307]  Enc Vitals Group     BP 102/67     Pulse Rate (!) 52     Resp 16     Temp 98.1 F (36.7 C)     Temp Source Oral     SpO2 95 %     Weight 180 lb (81.6  kg)     Height 6\' 3"  (1.905 m)     Head Circumference      Peak Flow      Pain Score      Pain Loc      Pain Edu?      Excl. in [place]?     Vital signs reviewed, nursing assessments reviewed.   Constitutional:   Alert and oriented. Ill-appearing. Eyes:   No scleral icterus. No conjunctival pallor. PERRL. EOMI.  No nystagmus. ENT   Head:   Normocephalic and atraumatic.   Nose:   No congestion/rhinnorhea. No septal hematoma   Mouth/Throat:   MMM, no pharyngeal erythema. No peritonsillar mass.    Neck:   No stridor. No SubQ emphysema. No meningismus. Hematological/Lymphatic/Immunilogical:   No cervical lymphadenopathy. Cardiovascular:   RRR. Symmetric bilateral radial and DP pulses.  No murmurs. On the monitor rhythm is regular, wide complex sine wave appearance Respiratory:   Normal respiratory effort without tachypnea nor retractions. Breath sounds are clear and equal bilaterally. No wheezes/rales/rhonchi. Gastrointestinal:   Soft and nontender. Non distended. There is no CVA tenderness.  No rebound, rigidity, or guarding. Genitourinary:   deferred Musculoskeletal:   Normal range of motion in all extremities. No joint effusions.  No lower extremity tenderness.  No edema. Neurologic:   Normal speech and language.  CN 2-10  normal. Motor grossly intact. No gross focal neurologic deficits are appreciated.  Skin:    Skin is warm, dry and intact. No rash noted.  No petechiae, purpura, or bullae.  ____________________________________________    LABS (pertinent positives/negatives) (all labs ordered are listed, but only abnormal results are displayed) Labs Reviewed  CBC WITH DIFFERENTIAL/PLATELET - Abnormal; Notable for the following:       Result Value   RBC 3.47 (*)    Hemoglobin 12.7 (*)    HCT 37.1 (*)    MCV 107.0 (*)    MCH 36.6 (*)    RDW 16.3 (*)    Neutro Abs 7.9 (*)    Lymphs Abs 0.8 (*)    All other components within normal limits  COMPREHENSIVE METABOLIC PANEL - Abnormal; Notable for the following:    Sodium 128 (*)    Potassium >7.5 (*)    Chloride 98 (*)    CO2 20 (*)    Glucose, Bld 108 (*)    BUN 54 (*)    Creatinine, Ser 7.90 (*)    AST 49 (*)    GFR calc non Af Amer 7 (*)    GFR calc Af Amer 8 (*)    All other components within normal limits  TROPONIN I  LACTIC ACID, PLASMA  GLUCOSE, CAPILLARY  LACTIC ACID, PLASMA   ____________________________________________   EKG  Unable to obtain due to cardiac arrest shortly after arrival  ____________________________________________    RADIOLOGY  No results found.  ____________________________________________   PROCEDURES Procedures CRITICAL CARE Performed by: Joni Fears, Airik Goodlin   Total critical care time: 60 minutes  Critical care time was exclusive of separately billable procedures and treating other patients.  Critical care was necessary to treat or prevent imminent or life-threatening deterioration.  Critical care was time spent personally by me on the following activities: development of treatment plan with patient and/or surrogate as well as nursing, discussions with consultants, evaluation of patient's response to treatment, examination of patient, obtaining history from patient or surrogate, ordering and  performing treatments and interventions, ordering and review of laboratory studies, ordering and review of radiographic studies, pulse  oximetry and re-evaluation of patient's condition.  INTUBATION Performed by: Joni Fears, Krayton Wortley  Required items: required blood products, implants, devices, and special equipment available Patient identity confirmed: provided demographic data and hospital-assigned identification number Time out: Immediately prior to procedure a "time out" was called to verify the correct patient, procedure, equipment, support staff and site/side marked as required.  Indications: Cardiac arrest, respiratory failure   Intubation method: Glidescope Laryngoscopy   Preoxygenation: BVM  Sedatives: None  Paralytic: None   Tube Size: 8-0 cuffed  first pass excess, not technically difficult or requiring prolonged attempt. Tube inserted to 22 at the lip Post-procedure assessment: chest rise and ETCO2 monitor Breath sounds: equal and absent over the epigastrium Tube secured with: ETT holder Chest x-ray interpreted by radiologist and me.  Chest x-ray findings: endotracheal tube in adequate position at the thoracic inlet, can be advanced 3-4 cm   Patient tolerated the procedure well with no immediate complications.   Cardiopulmonary Resuscitation (CPR) Procedure Note Directed/Performed by: Carrie Mew I personally directed ancillary staff and/or performed CPR in an effort to regain return of spontaneous circulation and to maintain cardiac, neuro and systemic perfusion. Total CPR time approximately 25 minutes  ELECTRIC CARDIOVERSION Performed by: Joni Fears, Michelina Mexicano Consent:  The procedure was performed in an emergent situation.  Verbal consent was not obtained.  Written consent was not obtained. Risks and benefits: risks, benefits and alternatives were discussed Consent given by: emergent Patient understanding: patient states understanding of the procedure being  performed Patient consent: the patient's understanding of the procedure matches consent given Required items: required blood products, implants, devices, and special equipment available Patient identity confirmed: verbally with patient Patient sedated: no Sedation type: moderate (conscious) sedation Sedatives: None  Analgesia: None  Cardioversion basis: emergent Pre-procedure rhythm: Ventricular tachycardia, patient unconscious Patient position: patient was placed in a supine position Chest area: chest area exposed Electrodes: pads Electrodes placed: anterior-posterior Number of attempts: 1 Attempt 1 mode: synchronous Attempt 1 waveform: biphasic Attempt 1 shock (in Joules): 200 Attempt 1 outcome: conversion to normal sinus rhythm Post-procedure rhythm: normal sinus rhythm Complications: no complications Patient tolerance: Patient tolerated the procedure well with no immediate complications    ____________________________________________   INITIAL IMPRESSION / ASSESSMENT AND PLAN / ED COURSE  Pertinent labs & imaging results that were available during my care of the patient were reviewed by me and considered in my medical decision making (see chart for details).  Patient arrived awake and alert complaining of generalized weakness. Monitor rhythm concerning for severe hyperkalemia. Patient subsequently had a syncopal episode lasting about 30 seconds where he then regained consciousness. Peripheral IV access was rapidly obtained and the patient was given an amp of calcium chloride and sodium bicarbonate through the peripheral IV due to concern for impending cardiac arrest.   However, shortly thereafter at about 1:17 PM the patient again lost consciousness and was found to be in cardiac arrest. CPR initiated with ACLS protocol. Patient received multiple doses of epinephrine calcium bicarbonate, 1 dose of atropine, multiple defibrillation shocks, one electrical cardioversion for V.  tach with a pulse, subsequently with a stable heart rate of 70-80, elevated blood pressure, regaining mental status. On reassessment after patient was more stabilized, at about 1:50 PM, pupils are dilated but reactive and symmetric. Nephrology paged at that time to arrange for emergent dialysis.     Clinical Course as of Aug 03 1517  Mon Aug 03, 2016  1406 D/w Alysia Penna, will plan for emergency HD.  D/w intensivist,  will eval for ICU admission and HD.   [PS]  1437 Blood pressure 160/120. Patient calm comfortable after 2 of Versed, 50 of fentanyl. Heart rate 50, narrow complex, regular. Good peripheral pulses. Awaiting transport to ICU for dialysis  [PS]  1512 Pt awake. Pt informed of clinical status and treatment plan. 100% SO2 on vent. Calm. Hemodynamically stable currently.   [PS]    Clinical Course User Index [PS] Carrie Mew, MD     ----------------------------------------- 4:06 PM on 08/03/2016 -----------------------------------------  Late entry addition, I'd like to note that while patient was being planned for ICU, nursing was planning to place a Foley and OG tube per protocol. However, because the circumstances of  his initial arrest seemed to potentially have a vagal component and currently the patient was actually waking up, I did not think that placing an og/ngt would be worth whenever small risk there might be to worsening his cardiac irritability. Similarly with the Foley, especially since the patient is likely oliguric from end-stage renal disease. I therefore requested they not place a foley or OGT in the ED.  ____________________________________________   FINAL CLINICAL IMPRESSION(S) / ED DIAGNOSES  Final diagnoses:  Hyperkalemia  Cardiac arrest due to other underlying condition ([place])  End stage renal disease ([place])      New Prescriptions   No medications on file     Portions of this note were generated with dragon dictation software. Dictation errors  may occur despite best attempts at proofreading.    Carrie Mew, MD 08/03/16 [place], MD 08/03/16 [phone]

## 2016-08-03 NOTE — H&P (Signed)
PULMONARY / CRITICAL CARE MEDICINE   Name: Todd Brown MRN: [id number] DOB: 01-Jul-1960    ADMISSION DATE:  08/03/2016  PT PROFILE: 56 year old male with end-stage renal disease admitted via emergency department after prolonged cardiac arrest. Per report, he missed dialysis recently and called EMS on the morning of admission when he was too weak to ambulate. He suffered bradycardia asystolic arrest in the emergency department due to severe hyperkalemia. He underwent approximately 30 minutes of ACLS. Post arrest, he is intubated with severe hypertension. Past medical history includes end-stage renal disease, diabetes-type II, apparently difficult to control hypertension  MAJOR EVENTS/TEST RESULTS: 03/05 Admitted after cardiac arrest in ED, 30 mins ACLS before ROSC   INDWELLING DEVICES:: ETT 03/05 >>    MICRO DATA:   ANTIMICROBIALS:     HISTORY OF PRESENT ILLNESS:   As above. There are no family members available to provide further history.  PAST MEDICAL HISTORY :  He  has a past medical history of Anemia; Diabetes mellitus without complication ([place]); Dialysis patient [facility]); ESRD (end stage renal disease) ([place]); Hyperlipidemia; Malignant hypertension; Renal artery stenosis ([place]); and Schizophrenia ([place]).  PAST SURGICAL HISTORY: He  has a past surgical history that includes Repair fx left lower leg; Cardiac catheterization (N/A, 10/22/2014); Insertion of dialysis catheter (Right); AV fistula placement (Left, 12/26/2014); and Cardiac catheterization (N/A, 02/28/2015).  Allergies  Allergen Reactions  . Thorazine [Chlorpromazine] Other (See Comments)    Reaction:  Unknown , pt states it makes him feel real bad    No current facility-administered medications on file prior to encounter.    Current Outpatient Prescriptions on File Prior to Encounter  Medication Sig  . amLODipine (NORVASC) 10 MG tablet Take 10 mg by mouth daily.  Marland Kitchen atenolol (TENORMIN) 25 MG tablet Take 25 mg by  mouth 2 (two) times daily.   Marland Kitchen b complex-vitamin c-folic acid (NEPHRO-VITE) 0.8 MG TABS tablet Take 1 tablet by mouth daily.  . calcium acetate (PHOSLO) 667 MG capsule Take 2 capsules (1,334 mg total) by mouth 3 (three) times daily with meals.  . carbamazepine (TEGRETOL XR) 200 MG 12 hr tablet Take 1,000 mg by mouth every morning.  . cholecalciferol (VITAMIN D) 1000 UNITS tablet Take 1,000 Units by mouth daily.  . citalopram (CELEXA) 20 MG tablet Take 1 tablet (20 mg total) by mouth daily.  . cloNIDine (CATAPRES) 0.2 MG tablet Take 1 tablet (0.2 mg total) by mouth 3 (three) times daily.  . ferrous sulfate 325 (65 FE) MG tablet Take 1 tablet (325 mg total) by mouth daily.  . fluPHENAZine (PROLIXIN) 2.5 MG tablet Take 2.5 mg by mouth daily.   . Fluticasone-Salmeterol (ADVAIR DISKUS) 250-50 MCG/DOSE AEPB Inhale 1 puff into the lungs 2 (two) times daily.  . hydrALAZINE (APRESOLINE) 100 MG tablet Take 1 tablet (100 mg total) by mouth every 6 (six) hours. (Patient taking differently: Take 100 mg by mouth 2 (two) times daily. )  . minoxidil (LONITEN) 2.5 MG tablet Take 2 tablets (5 mg total) by mouth daily.  . trihexyphenidyl (ARTANE) 5 MG tablet Take 5 mg by mouth 3 (three) times daily with meals.  Marland Kitchen acetaminophen (TYLENOL) 500 MG tablet Take 500 mg by mouth every 4 (four) hours as needed for mild pain. Take 1 tablet every 4-6 hours as needed for pain.  Marland Kitchen albuterol (PROAIR HFA) 108 (90 Base) MCG/ACT inhaler Inhale 1-2 puffs into the lungs every 6 (six) hours as needed for wheezing or shortness of breath (cough).  Marland Kitchen amoxicillin-clavulanate (AUGMENTIN)  500-125 MG tablet Take 1 tablet (500 mg total) by mouth 3 (three) times daily.  . baclofen (LIORESAL) 10 MG tablet Take 1 tablet (10 mg total) by mouth 2 (two) times daily as needed for muscle spasms.  . benzonatate (TESSALON) 200 MG capsule Take 1 capsule (200 mg total) by mouth 2 (two) times daily as needed for cough.  . calcium acetate (PHOSLO) 667 MG  capsule Take 1,334 mg by mouth 3 (three) times daily with meals.   . carbamazepine (TEGRETOL) 200 MG tablet Take 2.5 tablets (500 mg total) by mouth 2 (two) times daily. Taken 2 1/2 tabs (500mg  ) twice a day (Patient not taking: Reported on 04/14/2016)  . cyclobenzaprine (FLEXERIL) 5 MG tablet Take 5 mg by mouth at bedtime as needed for muscle spasms.  . fluticasone furoate-vilanterol (BREO ELLIPTA) 100-25 MCG/INH AEPB Inhale 1 puff into the lungs daily.  . traMADol (ULTRAM) 50 MG tablet Take 1 tablet (50 mg total) by mouth every 6 (six) hours as needed.    FAMILY HISTORY:  His indicated that his mother is alive. He indicated that his father is deceased. He indicated that his brother is deceased. He indicated that the status of his neg hx is unknown.    SOCIAL HISTORY: He  reports that he has been smoking Cigarettes.  He has been smoking about 0.50 packs per day. He has never used smokeless tobacco. He reports that he does not drink alcohol or use drugs.  REVIEW OF SYSTEMS:   Level V caveat  SUBJECTIVE:    VITAL SIGNS: BP (!) 156/129   Pulse (!) 54   Temp 98.1 F (36.7 C) (Oral)   Resp 20   Ht 6\' 3"  (1.905 m)   Wt 180 lb (81.6 kg)   SpO2 (!) 84%   BMI 22.50 kg/m   HEMODYNAMICS:    VENTILATOR SETTINGS: Vent Mode: AC FiO2 (%):  [100 %] 100 % Set Rate:  [20 bmp] 20 bmp Vt Set:  [450 mL] 450 mL PEEP:  [5 cmH20] 5 cmH20  INTAKE / OUTPUT: No intake/output data recorded.  PHYSICAL EXAMINATION: General: Chronically ill-appearing, comatose, intubated Neuro: Pupils are equal and react to light, extraocular movements appear intact, there is minimal spontaneous movement of extremities, deep tendon reflexes are symmetric HEENT: NCAT, sclerae white Cardiovascular: Regular, tachycardic, full pulses Lungs: No wheezes or other adventitious sounds Abdomen: Soft, diminished bowel sounds, no palpable organomegaly Extremities: 2-3+ symmetric pretibial edema with chronic stasis  changes Skin: Extremely scaly dry skin on both feet  LABS:  BMET  Recent Labs Lab 08/03/16 1316  NA 128*  K >7.5*  CL 98*  CO2 20*  BUN 54*  CREATININE 7.90*  GLUCOSE 108*    Electrolytes  Recent Labs Lab 08/03/16 1316  CALCIUM 9.1    CBC  Recent Labs Lab 08/03/16 1316  WBC 9.2  HGB 12.7*  HCT 37.1*  PLT 282    Coag's No results for input(s): APTT, INR in the last 168 hours.  Sepsis Markers  Recent Labs Lab 08/03/16 1316  LATICACIDVEN 1.6    ABG No results for input(s): PHART, PCO2ART, PO2ART in the last 168 hours.  Liver Enzymes  Recent Labs Lab 08/03/16 1316  AST 49*  ALT 47  ALKPHOS 120  BILITOT 0.7  ALBUMIN 3.9    Cardiac Enzymes  Recent Labs Lab 08/03/16 1316  TROPONINI <0.03    Glucose  Recent Labs Lab 08/03/16 1400  GLUCAP 93    CXR: Cardiomegaly with interstitial edema  ASSESSMENT / PLAN:  CARDIOVASCULAR A:  Cardiac arrest due to hyperkalemia after missed dialysis Chronic severe hypertension Acute severe hypertension Chronic beta blocker therapy Chronic clonidine therapy P:  Continue home meds of amlodipine, atenolol, clonidine Hold atenolol for heart rate less than 70/min given presentation of bradycardic arrest   PULMONARY A: Ventilator dependent acute respiratory failure due to cardiac arrest Mild pulmonary edema P:   Vent settings established Vent bundle implemented Daily SBT as indicated  RENAL A:   End-stage renal disease, hemodialysis dependent-access is left upper extremity fistula P:   Renal Service notified of need for emergent HD Monitor BMET intermittently Monitor I/Os Correct electrolytes as indicated  GASTROINTESTINAL A:   No acute issues P:   SUP: IV PPI Consider TFs 03/06  HEMATOLOGIC A:   Mild anemia-chronic Macrocytosis-chronic P:  DVT px: SQ heparin Monitor CBC intermittently Transfuse per usual guidelines  INFECTIOUS A:   No evidence of acute  infection P:   Monitor temp, WBC count Micro and abx as above  ENDOCRINE A:   Type 2 diabetes P:   SSI - moderate scale  NEUROLOGIC A:   Postanoxic encephalopathy History of schizophrenia ICU/ventilator associated discomfort P:   RASS goal: -1,-2 Propofol gtt ordered Continue Tegretol  FAMILY  There is no family present at the bedside during this evaluation. I will make an effort to contact them to provide an update. Eventually, it is likely that we will need to address goals of care    CCM time: 35 mins The above time includes time spent in consultation with patient and/or family members and reviewing care plan on multidisciplinary rounds  Merton Border, MD PCCM service Mobile [phone] Pager [phone]  08/03/2016, 3:18 PM

## 2016-08-03 NOTE — Progress Notes (Signed)
Hemodialysis-Equipment at bedside. Will initiate dialysis emergently when pt arrives to room in ICU.

## 2016-08-03 NOTE — Progress Notes (Signed)
Stat K resulted 6.1 after approx 55 minutes on HD. Continue 1K bath for rest of treatment per Dr. Candiss Norse. Done. Continue to monitor patient.

## 2016-08-03 NOTE — Progress Notes (Signed)
Pt's ETT was advanced by 3 cm to 26 cm at the lips.

## 2016-08-04 ENCOUNTER — Inpatient Hospital Stay (HOSPITAL_COMMUNITY)
Admit: 2016-08-04 | Discharge: 2016-08-04 | Disposition: A | Discharge status: Home / Self Care | Payer: Medicaid Other | Attending: Pulmonary Disease | Admitting: Pulmonary Disease

## 2016-08-04 ENCOUNTER — Inpatient Hospital Stay: Payer: Medicaid Other

## 2016-08-04 DIAGNOSIS — I10 Essential (primary) hypertension: Secondary | ICD-10-CM

## 2016-08-04 DIAGNOSIS — Z9911 Dependence on respirator [ventilator] status: Secondary | ICD-10-CM

## 2016-08-04 DIAGNOSIS — I361 Nonrheumatic tricuspid (valve) insufficiency: Secondary | ICD-10-CM

## 2016-08-04 DIAGNOSIS — I351 Nonrheumatic aortic (valve) insufficiency: Secondary | ICD-10-CM

## 2016-08-04 DIAGNOSIS — J810 Acute pulmonary edema: Secondary | ICD-10-CM

## 2016-08-04 LAB — BASIC METABOLIC PANEL
Anion gap: 9 (ref 5–15)
BUN: 28 mg/dL — AB (ref 6–20)
CHLORIDE: 99 mmol/L — AB (ref 101–111)
CO2: 29 mmol/L (ref 22–32)
CREATININE: 5.08 mg/dL — AB (ref 0.61–1.24)
Calcium: 8.5 mg/dL — ABNORMAL LOW (ref 8.9–10.3)
GFR calc Af Amer: 13 mL/min — ABNORMAL LOW (ref >60)
GFR calc non Af Amer: 12 mL/min — ABNORMAL LOW (ref >60)
GLUCOSE: 77 mg/dL (ref 65–99)
Potassium: 5.5 mmol/L — ABNORMAL HIGH (ref 3.5–5.1)
SODIUM: 137 mmol/L (ref 135–145)

## 2016-08-04 LAB — CBC
HCT: 34.7 % — ABNORMAL LOW (ref 40.0–52.0)
HEMOGLOBIN: 12 g/dL — AB (ref 13.0–18.0)
MCH: 37 pg — AB (ref 26.0–34.0)
MCHC: 34.7 g/dL (ref 32.0–36.0)
MCV: 106.6 fL — AB (ref 80.0–100.0)
Platelets: 159 10*3/uL (ref 150–440)
RBC: 3.26 MIL/uL — ABNORMAL LOW (ref 4.40–5.90)
RDW: 15.8 % — ABNORMAL HIGH (ref 11.5–14.5)
WBC: 12.3 10*3/uL — ABNORMAL HIGH (ref 3.8–10.6)

## 2016-08-04 LAB — GLUCOSE, CAPILLARY
GLUCOSE-CAPILLARY: 66 mg/dL (ref 65–99)
GLUCOSE-CAPILLARY: 65 mg/dL (ref 65–99)
GLUCOSE-CAPILLARY: 122 mg/dL — AB (ref 65–99)
GLUCOSE-CAPILLARY: 89 mg/dL (ref 65–99)
Glucose-Capillary: 102 mg/dL — ABNORMAL HIGH (ref 65–99)
Glucose-Capillary: 76 mg/dL (ref 65–99)
Glucose-Capillary: 78 mg/dL (ref 65–99)
Glucose-Capillary: 91 mg/dL (ref 65–99)

## 2016-08-04 LAB — ECHOCARDIOGRAM COMPLETE
Height: 73 in
Weight: 3015.89 oz

## 2016-08-04 LAB — HIV ANTIBODY (ROUTINE TESTING W REFLEX): HIV Screen 4th Generation wRfx: NONREACTIVE

## 2016-08-04 LAB — TROPONIN I: TROPONIN I: 1.11 ng/mL — AB (ref <0.03)

## 2016-08-04 MED ORDER — NITROGLYCERIN 0.4 MG SL SUBL
0.4000 mg | SUBLINGUAL_TABLET | SUBLINGUAL | Status: DC | PRN
  Administered 2016-08-04 (×2): 0.4 mg via SUBLINGUAL
  Filled 2016-08-04: qty 1

## 2016-08-04 MED ORDER — NITROGLYCERIN 0.4 MG SL SUBL
SUBLINGUAL_TABLET | SUBLINGUAL | Status: AC
  Filled 2016-08-04: qty 1

## 2016-08-04 MED ORDER — DEXTROSE 50 % IV SOLN: 1.0000 | Freq: Once | INTRAVENOUS | Status: DC

## 2016-08-04 MED ORDER — DEXTROSE 50 % IV SOLN
INTRAVENOUS | Status: AC
  Administered 2016-08-04: 50 mL
  Filled 2016-08-04: qty 50

## 2016-08-04 MED ORDER — INSULIN ASPART 100 UNIT/ML ~~LOC~~ SOLN: 0.0000 [IU] | Freq: Three times a day (TID) | SUBCUTANEOUS | Status: DC

## 2016-08-04 MED ORDER — DEXTROSE 50 % IV SOLN
1.0000 | Freq: Once | INTRAVENOUS | Status: AC
  Administered 2016-08-04: 50 mL via INTRAVENOUS
  Filled 2016-08-04: qty 50

## 2016-08-04 MED ORDER — PANTOPRAZOLE SODIUM 40 MG PO PACK: 40.0000 mg | PACK | Freq: Every day | ORAL | Status: DC

## 2016-08-04 MED ORDER — CARBAMAZEPINE 200 MG PO TABS
500.0000 mg | ORAL_TABLET | Freq: Two times a day (BID) | ORAL | Status: DC
  Administered 2016-08-04 – 2016-08-18 (×26): 500 mg via ORAL
  Filled 2016-08-04 (×8): qty 3
  Filled 2016-08-04: qty 2.5
  Filled 2016-08-04 (×6): qty 3
  Filled 2016-08-04: qty 1
  Filled 2016-08-04 (×4): qty 3
  Filled 2016-08-04: qty 1
  Filled 2016-08-04 (×6): qty 3

## 2016-08-04 MED ORDER — CLONIDINE HCL 0.1 MG PO TABS
0.2000 mg | ORAL_TABLET | Freq: Three times a day (TID) | ORAL | Status: DC
  Administered 2016-08-04 – 2016-08-06 (×5): 0.2 mg via ORAL
  Filled 2016-08-04 (×5): qty 2

## 2016-08-04 MED ORDER — MORPHINE SULFATE (PF) 4 MG/ML IV SOLN
2.0000 mg | Freq: Once | INTRAVENOUS | Status: AC
  Administered 2016-08-04: 2 mg via INTRAVENOUS
  Filled 2016-08-04: qty 1

## 2016-08-04 MED ORDER — PANTOPRAZOLE SODIUM 40 MG PO PACK
40.0000 mg | PACK | Freq: Every day | ORAL | Status: DC
  Filled 2016-08-04: qty 20

## 2016-08-04 MED FILL — Medication: Qty: 1 | Status: AC

## 2016-08-04 NOTE — Progress Notes (Signed)
CONCERNING: IV to Oral Route Change Policy  RECOMMENDATION: This patient is receiving pantoprazole by the intravenous route.  Based on criteria approved by the Pharmacy and Therapeutics Committee, the intravenous medication(s) is/are being converted to the equivalent oral dose form(s).   DESCRIPTION: These criteria include:  The patient is eating (either orally or via tube) and/or has been taking other orally administered medications for a least 24 hours  The patient has no evidence of active gastrointestinal bleeding or impaired GI absorption (gastrectomy, short bowel, patient on TNA or NPO).  If you have questions about this conversion, please contact the Pharmacy Department  []   [phone] )  [facility] [x]   [phone] )  [facility] []   [phone] )  [facility] []   [phone] )  [facility] []   [phone] )  [place], [facility] 08/04/2016 9:22 AM

## 2016-08-04 NOTE — Progress Notes (Signed)
Subjective:   Patient went emergent HD yesterday for severe hyperkalemia K 5.5 today Vent dependent but arousable Patient seen during dialysis Tolerating well    HEMODIALYSIS FLOWSHEET:  Blood Flow Rate (mL/min): 400 mL/min Arterial Pressure (mmHg): -150 mmHg Venous Pressure (mmHg): 170 mmHg Transmembrane Pressure (mmHg): 30 mmHg Ultrafiltration Rate (mL/min): 710 mL/min Dialysate Flow Rate (mL/min): 800 ml/min Conductivity: Machine : 13.7 Conductivity: Machine : 13.7 Dialysis Fluid Bolus: Normal Saline Bolus Amount (mL): 250 mL Dialysate Change: 2K Intra-Hemodialysis Comments: 426 ml removed. no change     Objective:  Vital signs in last 24 hours:  Temp:  [97.6 F (36.4 C)-98.1 F (36.7 C)] 97.6 F (36.4 C) (03/06 0924) Pulse Rate:  [48-70] 54 (03/06 1000) Resp:  [15-31] 20 (03/06 1000) BP: (95-263)/(23-244) 117/69 (03/06 1000) SpO2:  [62 %-100 %] 99 % (03/06 1000) FiO2 (%):  [40 %-100 %] 40 % (03/06 0800) Weight:  [81.6 kg (180 lb)-88.1 kg (194 lb 3.6 oz)] 85.9 kg (189 lb 6 oz) (03/06 0910)  Weight change:  Filed Weights   08/03/16 1940 08/04/16 0425 08/04/16 0910  Weight: 88.1 kg (194 lb 3.6 oz) 85.5 kg (188 lb 7.9 oz) 85.9 kg (189 lb 6 oz)    Intake/Output:    Intake/Output Summary (Last 24 hours) at 08/04/16 1002 Last data filed at 08/04/16 0800  Gross per 24 hour  Intake           346.23 ml  Output                0 ml  Net           346.23 ml     Physical Exam: General: Critically ill appearing  HEENT Anicteric, ET tube in place  Neck supple  Pulm/lungs Coarse breath sound bilaterally, ventilatory assisted  CVS/Heart Regular rhythm,   Abdomen:  Soft, nontender, nondistended  Extremities: Trace edema  Neurologic: sedated  Skin: Dry skin  Access: Left upper arm AV fistula       Basic Metabolic Panel:   Recent Labs Lab 08/03/16 1316 08/03/16 1628 08/03/16 1923 08/04/16 0207  NA 128*  --   --  137  K >7.5* 6.1* 3.8 5.5*  CL 98*  --    --  99*  CO2 20*  --   --  29  GLUCOSE 108*  --   --  77  BUN 54*  --   --  28*  CREATININE 7.90*  --   --  5.08*  CALCIUM 9.1  --   --  8.5*  MG  --   --  1.9  --      CBC:  Recent Labs Lab 08/03/16 1316 08/04/16 0237  WBC 9.2 12.3*  NEUTROABS 7.9*  --   HGB 12.7* 12.0*  HCT 37.1* 34.7*  MCV 107.0* 106.6*  PLT 282 159      Microbiology:  Recent Results (from the past 720 hour(s))  MRSA PCR Screening     Status: None   Collection Time: 08/03/16  6:09 PM  Result Value Ref Range Status   MRSA by PCR NEGATIVE NEGATIVE Final    Comment:        The GeneXpert MRSA Assay (FDA approved for NASAL specimens only), is one component of a comprehensive MRSA colonization surveillance program. It is not intended to diagnose MRSA infection nor to guide or monitor treatment for MRSA infections.     Coagulation Studies: No results for input(s): LABPROT, INR in the last 72 hours.    Urinalysis: No results for input(s): COLORURINE, LABSPEC, PHURINE, GLUCOSEU, HGBUR, BILIRUBINUR, KETONESUR, PROTEINUR, UROBILINOGEN, NITRITE, LEUKOCYTESUR in the last 72 hours.  Invalid input(s): APPERANCEUR    Imaging: Dg Abd 1 View  Result Date: 08/03/2016 CLINICAL DATA:  NG tube placement. EXAM: ABDOMEN - 1 VIEW COMPARISON:  None. FINDINGS: Side port of the NG tube is just above the GE junction and could be advanced for more optimal positioning. There is mild distention of the stomach. Bowel gas pattern is otherwise normal. IMPRESSION: Side port of the NG tube terminates just above the stomach. Electronically Signed   By: Christopher  Mattern M.D.   On: 08/03/2016 17:15   Dg Chest Port 1 View  Result Date: 08/04/2016 CLINICAL DATA:  Respiratory failure EXAM: PORTABLE CHEST 1 VIEW COMPARISON:  08/03/2016 FINDINGS: The endotracheal tube tip is 3.8 cm above the carina. The nasogastric tube extends well into the stomach. Central and basilar opacities persist, with mild worsening in the bases.  Probable small right pleural effusion. IMPRESSION: 1.  Support equipment appears satisfactorily positioned. 2. Mild worsening of basilar airspace opacities. Small right pleural effusion. Electronically Signed   By: Daniel R Mitchell M.D.   On: 08/04/2016 04:43   Dg Chest Portable 1 View  Result Date: 08/03/2016 CLINICAL DATA:  Weak, post intubation EXAM: PORTABLE CHEST 1 VIEW COMPARISON:  05/30/2016 FINDINGS: Pacer patches obscure portions of the chest. Endotracheal tube tip is at the thoracic inlet. There is mild cardiomegaly. Diffuse mildly reticular interstitial opacities are present, which may reflect pulmonary edema or possible infection. No large effusion. No focal consolidation. No pneumothorax. IMPRESSION: 1. Endotracheal tube tip is at the thoracic inlet 2. Cardiomegaly. Diffuse mildly reticular interstitial opacities suggestive of pulmonary edema. Electronically Signed   By: Kim  Fujinaga M.D.   On: 08/03/2016 14:34     Medications:   . propofol (DIPRIVAN) infusion 30 mcg/kg/min (08/04/16 0322)   . calcium chloride  1 g Intravenous Once  . carbamazepine  500 mg Per Tube BID  . chlorhexidine gluconate (MEDLINE KIT)  15 mL Mouth Rinse BID  . cloNIDine  0.2 mg Per Tube TID  . heparin  5,000 Units Subcutaneous Q8H  . Influenza vac split quadrivalent PF  0.5 mL Intramuscular Tomorrow-1000  . insulin aspart  0-15 Units Subcutaneous Q4H  . mouth rinse  15 mL Mouth Rinse 10 times per day  . pantoprazole sodium  40 mg Per Tube QHS  . pneumococcal 23 valent vaccine  0.5 mL Intramuscular Tomorrow-1000   sodium chloride, ipratropium-albuterol  Assessment/ Plan:  56 y.o.african American male  With schizophrenia, hypertension, hepatitis C, tobacco abuse, anemia of chronic kidney disease, secondary hyperparathyroidism, end-stage renal disease   [facility] [address]   1.  End-stage renal disease 2.  Severe hyperkalemia, life threatening 3.  Acute respiratory failure, now ventilatory  assisted 4.  Anemia of chronic kidney disease.  Patient presents with critical hyperkalemia - Confirmed from outpatient unit that he did NOT miss his treatment He completed his full treatment on Friday Plan for another treatment today Discussed with mother. She will look through his pantry/fridge for any juices etc.      LOS: 1 , 3/6/201810:02 AM   

## 2016-08-04 NOTE — Progress Notes (Signed)
PULMONARY / CRITICAL CARE MEDICINE   Name: Todd Brown MRN: [id number] DOB: [birth year]    ADMISSION DATE:  08/03/2016  PT PROFILE: 56 year old male with end-stage renal disease admitted via emergency department after prolonged cardiac arrest. Per report, he missed dialysis recently and called EMS on the morning of admission when he was too weak to ambulate. He suffered bradycardia asystolic arrest in the emergency department due to severe hyperkalemia. He underwent approximately 30 minutes of ACLS. Post arrest, he is intubated with severe hypertension. Past medical history includes end-stage renal disease, diabetes-type II, apparently difficult to control hypertension  MAJOR EVENTS/TEST RESULTS: 03/05 Admitted after cardiac arrest in ED, 30 mins ACLS before ROSC received emergent HD 03/06 Echocardiogram:   INDWELLING DEVICES:: ETT 03/05 >>   MICRO DATA: MRSA PCR 03/5>>negative   ANTIMICROBIALS:  None    SUBJECTIVE:  RASS -2, -3. + F/C  VITAL SIGNS: BP (!) 141/82   Pulse (!) 59   Temp 98 F (36.7 C) (Axillary)   Resp 15   Ht 6\' 1"  (1.854 m)   Wt 85.5 kg (188 lb 7.9 oz)   SpO2 99%   BMI 24.87 kg/m   HEMODYNAMICS:    VENTILATOR SETTINGS: Vent Mode: PRVC FiO2 (%):  [60 %-100 %] 60 % Set Rate:  [20 bmp] 20 bmp Vt Set:  [450 mL] 450 mL PEEP:  [5 cmH20] 5 cmH20  INTAKE / OUTPUT: I/O last 3 completed shifts: In: 38 [I.V.:13] Out: -   PHYSICAL EXAMINATION: General: Chronically ill-appearing AA male, intubated Neuro: lightly sedated, follows commands, PERRL HEENT: supple, no JVD Cardiovascular: sinus brady, s1s2, no M/R/G Lungs: No wheezes or other adventitious sounds, even, non labored  Abdomen: Soft, diminished bowel sounds x4, soft, non distended  Extremities: 2-3+ symmetric pretibial edema with chronic stasis changes Skin: Extremely scaly dry skin bilateral feet  LABS:  BMET  Recent Labs Lab 08/03/16 1316 08/03/16 1628 08/03/16 1923 08/04/16 0207   NA 128*  --   --  137  K >7.5* 6.1* 3.8 5.5*  CL 98*  --   --  99*  CO2 20*  --   --  29  BUN 54*  --   --  28*  CREATININE 7.90*  --   --  5.08*  GLUCOSE 108*  --   --  77    Electrolytes  Recent Labs Lab 08/03/16 1316 08/03/16 1923 08/04/16 0207  CALCIUM 9.1  --  8.5*  MG  --  1.9  --     CBC  Recent Labs Lab 08/03/16 1316 08/04/16 0237  WBC 9.2 12.3*  HGB 12.7* 12.0*  HCT 37.1* 34.7*  PLT 282 159    Coag's No results for input(s): APTT, INR in the last 168 hours.  Sepsis Markers  Recent Labs Lab 08/03/16 1316  LATICACIDVEN 1.6    ABG  Recent Labs Lab 08/03/16 1618 08/03/16 2135  PHART 7.39 7.45  PCO2ART 47 49*  PO2ART 108 93    Liver Enzymes  Recent Labs Lab 08/03/16 1316  AST 49*  ALT 47  ALKPHOS 120  BILITOT 0.7  ALBUMIN 3.9    Cardiac Enzymes  Recent Labs Lab 08/03/16 1608 08/03/16 1923 08/04/16 0207  TROPONINI 0.21* 0.84* 1.11*    Glucose  Recent Labs Lab 08/03/16 1400 08/03/16 1536 08/03/16 1942 08/04/16 0008 08/04/16 0345 08/04/16 0447  GLUCAP 93 98 89 78 66 76    CXR: Cardiomegaly with interstitial edema    ASSESSMENT / PLAN: CARDIOVASCULAR A:  Cardiac  arrest due to hyperkalemia after missed dialysis Minimally elevated trop I - probably not the primary event Chronic severe hypertension Acute severe hypertension Chronic beta blocker therapy Chronic clonidine therapy P:  Continue home meds of amlodipine, clonidine Holding atenolol Continuous telemetry monitoring  F/U TTE results Recheck trop I in AM 03/07 - do not think this is a primary coronary event.   Can consider some form of stress test in future   PULMONARY A: Ventilator dependent acute respiratory failure due to cardiac arrest Mild pulmonary edema P:   Vent settings established Vent bundle implemented SBT today 03/6 Anticipate extubation after HD completed today  RENAL A:   End-stage renal disease, hemodialysis dependent-access  is left upper extremity fistula Hyperkalemia-improving  P:   Renal Service consulted hemodialysis per recommendations  Monitor BMET intermittently Monitor I/Os Correct electrolytes as indicated  GASTROINTESTINAL A:   No acute issues P:   SUP: IV PPI Consider TFs 03/06 if unable to extubate  HEMATOLOGIC A:   Mild anemia-chronic Macrocytosis-chronic P:  DVT px: SQ heparin Monitor CBC intermittently Transfuse per usual guidelines  INFECTIOUS A:   No evidence of acute infection P:   Monitor temp, WBC count Micro and abx as above  ENDOCRINE A:   Type 2 diabetes P:   SSI - moderate scale  NEUROLOGIC A:   Postanoxic encephalopathy-improving  History of schizophrenia ICU/ventilator associated discomfort P:   RASS goal: 0 to -1 Propofol gtt to maintain RASS goal  WUA daily Continue Tegretol  FAMILY  Mother updated @ bedside   45 mins CCM time  Merton Border, MD PCCM service Mobile [phone] Pager [phone] 08/04/2016

## 2016-08-04 NOTE — Progress Notes (Signed)
Post HD assessment unchanged  

## 2016-08-04 NOTE — Plan of Care (Signed)
Problem: Physical Regulation: Goal: Ability to maintain clinical measurements within normal limits will improve Outcome: Progressing Patient has remained within clinical measurements this shift.

## 2016-08-04 NOTE — Progress Notes (Signed)
Pre hd assessment  

## 2016-08-04 NOTE — Progress Notes (Signed)
Pre Dialysis 

## 2016-08-04 NOTE — Progress Notes (Signed)
HD initiated without issue via L AVF. No heparin treatment.

## 2016-08-04 NOTE — Progress Notes (Signed)
Initial Nutrition Assessment  DOCUMENTATION CODES:   Not applicable  INTERVENTION:  - If unable to extubate within 24-48 hours, recommend initiation of TF - RD continue to monitor nutritional status and nutritional needs   NUTRITION DIAGNOSIS:   Inadequate oral intake related to inability to eat as evidenced by NPO status.  GOAL:   Patient will meet greater than or equal to 90% of their needs  MONITOR:   PO intake, Weight trends, Diet advancement, I & O's, Vent status  REASON FOR ASSESSMENT:   Ventilator    ASSESSMENT:   56 y.o. Male with PMH of ESRD-on HD, DM type II, HTN presented to the ER s/p cardiac arrest. Pt was intubated with severe HTN.   Pt is currently intubated on ventilator support. MV: 9.4 L/min Propofol rate: 15.7 mL/hr, currently providing 414 kcal/day  Temp (24hrs), Avg:97.8 F (36.6 C), Min:97.6 F (36.4 C), Max:98.1 F (36.7 C)  Pt was receiving HD at time of visit. S/P HD plan is to wean pt from ventilator, with hopes to extubate.  Labs reviewed; K (5.5) Medications reviewed; IV calcium chloride, Protonix, Propofol  Nutrition-focused physical exam completed. Findings are no muscle depletion, no fat depletion, and moderate edema.  Diet Order:     Skin:  Reviewed, no issues  Last BM:  no date recorded  Height:   Ht Readings from Last 1 Encounters:  08/03/16 6\' 1"  (1.854 m)    Weight:   Wt Readings from Last 1 Encounters:  08/04/16 189 lb 6 oz (85.9 kg)    Ideal Body Weight:  83.6 kg  BMI:  Body mass index is 24.99 kg/m.  Estimated Nutritional Needs:   Kcal:  1886  Protein:  129-172 grams  Fluid:  Urine output + 1 L/d  EDUCATION NEEDS:   Education needs no appropriate at this time  [employer]

## 2016-08-04 NOTE — Progress Notes (Signed)
[place] [facility] called. Will fax immunization records to ICU.

## 2016-08-04 NOTE — Progress Notes (Signed)
HD completed without issue. 2L UF. Patient tolerated well.

## 2016-08-04 NOTE — Progress Notes (Signed)
*  PRELIMINARY RESULTS* Echocardiogram 2D Echocardiogram has been performed.  Todd Brown 08/04/2016, 8:16 AM

## 2016-08-04 NOTE — Progress Notes (Signed)
Pt. Extubated and placed on 2 liters of oxygen. No apparent distress noted at this time.

## 2016-08-04 NOTE — Plan of Care (Signed)
Problem: Skin Integrity Impairment Risk: Goal: Skin integrity will improve Outcome: Progressing Skin integrity has been maintained this shift. Patient has been extubated and can freely move about the bed.

## 2016-08-05 ENCOUNTER — Inpatient Hospital Stay: Payer: Medicaid Other

## 2016-08-05 DIAGNOSIS — I272 Pulmonary hypertension, unspecified: Secondary | ICD-10-CM

## 2016-08-05 LAB — CBC
HEMATOCRIT: 33.2 % — AB (ref 40.0–52.0)
HEMOGLOBIN: 11.3 g/dL — AB (ref 13.0–18.0)
MCH: 35.6 pg — AB (ref 26.0–34.0)
MCHC: 34.2 g/dL (ref 32.0–36.0)
MCV: 104.2 fL — AB (ref 80.0–100.0)
Platelets: 146 10*3/uL — ABNORMAL LOW (ref 150–440)
RBC: 3.18 MIL/uL — ABNORMAL LOW (ref 4.40–5.90)
RDW: 15.7 % — AB (ref 11.5–14.5)
WBC: 12.1 10*3/uL — ABNORMAL HIGH (ref 3.8–10.6)

## 2016-08-05 LAB — BASIC METABOLIC PANEL
ANION GAP: 11 (ref 5–15)
BUN: 24 mg/dL — AB (ref 6–20)
CHLORIDE: 103 mmol/L (ref 101–111)
CO2: 26 mmol/L (ref 22–32)
Calcium: 8.5 mg/dL — ABNORMAL LOW (ref 8.9–10.3)
Creatinine, Ser: 4.94 mg/dL — ABNORMAL HIGH (ref 0.61–1.24)
GFR calc Af Amer: 14 mL/min — ABNORMAL LOW (ref >60)
GFR, EST NON AFRICAN AMERICAN: 12 mL/min — AB (ref >60)
GLUCOSE: 81 mg/dL (ref 65–99)
POTASSIUM: 5.4 mmol/L — AB (ref 3.5–5.1)
Sodium: 140 mmol/L (ref 135–145)

## 2016-08-05 LAB — GLUCOSE, CAPILLARY
GLUCOSE-CAPILLARY: 70 mg/dL (ref 65–99)
GLUCOSE-CAPILLARY: 137 mg/dL — AB (ref 65–99)
GLUCOSE-CAPILLARY: 162 mg/dL — AB (ref 65–99)
Glucose-Capillary: 86 mg/dL (ref 65–99)

## 2016-08-05 LAB — TROPONIN I: TROPONIN I: 0.34 ng/mL — AB (ref <0.03)

## 2016-08-05 MED ORDER — CALCIUM ACETATE (PHOS BINDER) 667 MG PO CAPS
1334.0000 mg | ORAL_CAPSULE | Freq: Three times a day (TID) | ORAL | Status: DC
  Administered 2016-08-05 – 2016-08-18 (×33): 1334 mg via ORAL
  Filled 2016-08-05 (×32): qty 2

## 2016-08-05 MED ORDER — INSULIN ASPART 100 UNIT/ML ~~LOC~~ SOLN: 0.0000 [IU] | Freq: Every day | SUBCUTANEOUS | Status: DC

## 2016-08-05 MED ORDER — INSULIN ASPART 100 UNIT/ML ~~LOC~~ SOLN
0.0000 [IU] | Freq: Three times a day (TID) | SUBCUTANEOUS | Status: DC
  Administered 2016-08-05 – 2016-08-12 (×4): 2 [IU] via SUBCUTANEOUS
  Administered 2016-08-13: 11 [IU] via SUBCUTANEOUS
  Administered 2016-08-13: 8 [IU] via SUBCUTANEOUS
  Administered 2016-08-16: 2 [IU] via SUBCUTANEOUS
  Filled 2016-08-05: qty 8
  Filled 2016-08-05 (×5): qty 2
  Filled 2016-08-05: qty 11

## 2016-08-05 MED ORDER — TRAMADOL HCL 50 MG PO TABS
50.0000 mg | ORAL_TABLET | Freq: Two times a day (BID) | ORAL | Status: DC | PRN
  Administered 2016-08-05 – 2016-08-06 (×2): 50 mg via ORAL
  Filled 2016-08-05 (×2): qty 1

## 2016-08-05 MED ORDER — AMLODIPINE BESYLATE 10 MG PO TABS
10.0000 mg | ORAL_TABLET | Freq: Every day | ORAL | Status: DC
  Administered 2016-08-05: 10 mg via ORAL
  Filled 2016-08-05 (×2): qty 1

## 2016-08-05 NOTE — Progress Notes (Signed)
Hd start 

## 2016-08-05 NOTE — Progress Notes (Signed)
  End of hd 

## 2016-08-05 NOTE — Progress Notes (Signed)
Pre-hd tx 

## 2016-08-05 NOTE — Progress Notes (Signed)
Patient alert with no distress noted.  Moved by bed to new room on 2A by Chasity, NT.  Patient moved on tele monitor.

## 2016-08-05 NOTE — Progress Notes (Addendum)
PULMONARY / CRITICAL CARE MEDICINE   Name: Todd Brown MRN: [id number] DOB: August 19, 1960    ADMISSION DATE:  08/03/2016  PT PROFILE: 56 year old male with end-stage renal disease admitted via emergency department after prolonged cardiac arrest. Per report, he missed dialysis recently and called EMS on the morning of admission when he was too weak to ambulate. He suffered bradycardia asystolic arrest in the emergency department due to severe hyperkalemia (8.8). He underwent approximately 30 minutes of ACLS. Post arrest, he is intubated with severe hypertension. Past medical history includes end-stage renal disease, diabetes-type II, apparently difficult to control hypertension  MAJOR EVENTS/TEST RESULTS: 03/05 Admitted after cardiac arrest in ED due to severe hyperkalemia (8.8), 30 mins ACLS before ROSC received emergent HD 03/06 Echocardiogram: LVEF 60-65%, mild-mod LVH, LA mildly dilated, RV moderately dilated, RA mod-severely dilated, RVSP estimate 80 mmHg (this was noted on previous echocardiogram) 03/06 repeat HD then extubated successfully to [place] O2 03/07 transferred to telemetry. [facility] to assume care  INDWELLING DEVICES:: ETT 03/05 >> 03/06  MICRO DATA: MRSA PCR 03/5>>negative   ANTIMICROBIALS:  None   SUBJECTIVE:  RASS 0, comfortable on [place] O2. Poorly oriented - baseline unclear  VITAL SIGNS: BP 108/77   Pulse 73   Temp 98.1 F (36.7 C) (Oral)   Resp 16   Ht 6\' 1"  (1.854 m)   Wt 179 lb 14.3 oz (81.6 kg)   SpO2 100%   BMI 23.73 kg/m   HEMODYNAMICS:    VENTILATOR SETTINGS: Vent Mode: PSV FiO2 (%):  [28 %-40 %] 28 % PEEP:  [5 cmH20] 5 cmH20 Pressure Support:  [5 cmH20] 5 cmH20  INTAKE / OUTPUT: I/O last 3 completed shifts: In: 412.5 [I.V.:412.5] Out: 2000 [Other:2000]  PHYSICAL EXAMINATION: General: NAD Neuro: no focal deficits HEENT: + JVD Cardiovascular: reg, no M Lungs: No wheezes Abdomen: Soft, diminished bowel sounds x4, soft, non  distended  Extremities: 1-2+ symmetric pretibial edema with chronic stasis changes  LABS:  BMET  Recent Labs Lab 08/03/16 1316  08/03/16 1923 08/04/16 0207 08/05/16 0514  NA 128*  --   --  137 140  K >7.5*  < > 3.8 5.5* 5.4*  CL 98*  --   --  99* 103  CO2 20*  --   --  29 26  BUN 54*  --   --  28* 24*  CREATININE 7.90*  --   --  5.08* 4.94*  GLUCOSE 108*  --   --  77 81  < > = values in this interval not displayed.  Electrolytes  Recent Labs Lab 08/03/16 1316 08/03/16 1923 08/04/16 0207 08/05/16 0514  CALCIUM 9.1  --  8.5* 8.5*  MG  --  1.9  --   --     CBC  Recent Labs Lab 08/03/16 1316 08/04/16 0237 08/05/16 0514  WBC 9.2 12.3* 12.1*  HGB 12.7* 12.0* 11.3*  HCT 37.1* 34.7* 33.2*  PLT 282 159 146*    Coag's No results for input(s): APTT, INR in the last 168 hours.  Sepsis Markers  Recent Labs Lab 08/03/16 1316  LATICACIDVEN 1.6    ABG  Recent Labs Lab 08/03/16 1618 08/03/16 2135  PHART 7.39 7.45  PCO2ART 47 49*  PO2ART 108 93    Liver Enzymes  Recent Labs Lab 08/03/16 1316  AST 49*  ALT 47  ALKPHOS 120  BILITOT 0.7  ALBUMIN 3.9    Cardiac Enzymes  Recent Labs Lab 08/03/16 1923 08/04/16 0207 08/05/16 0514  TROPONINI 0.84*  1.11* 0.34*    Glucose  Recent Labs Lab 08/04/16 0727 08/04/16 1114 08/04/16 1150 08/04/16 1622 08/04/16 2118 08/05/16 0721  GLUCAP 91 65 122* 102* 89 70    CXR: [place] cardiomegaly with interstitial prominence    ASSESSMENT / PLAN: CARDIOVASCULAR A:  Cardiac arrest due to severe hyperkalemia  Minimally elevated trop I - doubt primary coronary event Chronic severe hypertension Acute severe hypertension Chronic beta blocker therapy Chronic clonidine therapy P:  Continue home meds of amlodipine, clonidine Holding atenolol Continue telemetry monitoring   PULMONARY A: Ventilator dependent acute respiratory failure due to cardiac arrest Mild pulmonary edema pattern on CXR Severe  pulmonary hypertension by echocardiogram - not changed from prior study in 05/2016 P:   Continue supplemental O2 to maintain SpO2 > 93% He will need assessment of O2 needs prior to discharge Would favor more aggressive volume removal, then re-assessment of PA pressures when he is as "dry" as he can tolerate  RENAL A:   End-stage renal disease, hemodialysis dependent-access is left upper extremity fistula Hyperkalemia  P:   Renal Service managing HD Monitor BMET intermittently Monitor I/Os Correct electrolytes as indicated  GASTROINTESTINAL A:   No acute issues P:   SUP: N/I post extubation Consider TFs 03/06 if unable to extubate  HEMATOLOGIC A:   Mild anemia-chronic Macrocytosis-chronic P:  DVT px: SQ heparin Monitor CBC intermittently Transfuse per usual guidelines  INFECTIOUS A:   No evidence of acute infection P:   Monitor temp, WBC count Micro and abx as above  ENDOCRINE A:   Type 2 diabetes P:   Cont SSI - moderate scale - changed to ACHS 03/07  NEUROLOGIC A:   Postanoxic encephalopathy-improving  History of schizophrenia P:   RASS goal: 0 Continue Tegretol   Transfer to telemetry. [facility] to assume care after transfer and PCCM will sign off   Merton Border, MD PCCM service Mobile [phone] Pager [phone] 08/05/2016

## 2016-08-05 NOTE — Progress Notes (Signed)
Report called to Caryl Pina, RN on 2A.  Patient going to room 253.

## 2016-08-05 NOTE — Progress Notes (Signed)
Post hd vitals 

## 2016-08-05 NOTE — Progress Notes (Signed)
Subjective:   Patient is now extubated Able to eat breakfast without nausea or vomiting No SOB   Objective:  Vital signs in last 24 hours:  Temp:  [97.8 F (36.6 C)-98.6 F (37 C)] 97.8 F (36.6 C) (03/07 1349) Pulse Rate:  [51-82] 51 (03/07 1359) Resp:  [10-18] 10 (03/07 1359) BP: (99-131)/(49-94) 99/68 (03/07 1359) SpO2:  [88 %-100 %] 96 % (03/07 1359) Weight:  [81.6 kg (179 lb 14.3 oz)] 81.6 kg (179 lb 14.3 oz) (03/07 1349)  Weight change: 4.252 kg (9 lb 6 oz) Filed Weights   08/04/16 1255 08/05/16 0503 08/05/16 1349  Weight: 83.6 kg (184 lb 4.9 oz) 81.6 kg (179 lb 14.3 oz) 81.6 kg (179 lb 14.3 oz)    Intake/Output:   No intake or output data in the 24 hours ending 08/05/16 1431   Physical Exam: General: Chronically ill appearing  HEENT Anicteric,  Neck supple  Pulm/lungs Coarse breath sound bilaterally,   CVS/Heart Regular rhythm,   Abdomen:  Soft, nontender, nondistended  Extremities: Trace edema  Neurologic: sedated  Skin: Dry skin  Access: Left upper arm AV fistula       Basic Metabolic Panel:   Recent Labs Lab 08/03/16 1316 08/03/16 1628 08/03/16 1923 08/04/16 0207 08/05/16 0514  NA 128*  --   --  137 140  K >7.5* 6.1* 3.8 5.5* 5.4*  CL 98*  --   --  99* 103  CO2 20*  --   --  29 26  GLUCOSE 108*  --   --  77 81  BUN 54*  --   --  28* 24*  CREATININE 7.90*  --   --  5.08* 4.94*  CALCIUM 9.1  --   --  8.5* 8.5*  MG  --   --  1.9  --   --      CBC:  Recent Labs Lab 08/03/16 1316 08/04/16 0237 08/05/16 0514  WBC 9.2 12.3* 12.1*  NEUTROABS 7.9*  --   --   HGB 12.7* 12.0* 11.3*  HCT 37.1* 34.7* 33.2*  MCV 107.0* 106.6* 104.2*  PLT 282 159 146*      Microbiology:  Recent Results (from the past 720 hour(s))  MRSA PCR Screening     Status: None   Collection Time: 08/03/16  6:09 PM  Result Value Ref Range Status   MRSA by PCR NEGATIVE NEGATIVE Final    Comment:        The GeneXpert MRSA Assay (FDA approved for NASAL  specimens only), is one component of a comprehensive MRSA colonization surveillance program. It is not intended to diagnose MRSA infection nor to guide or monitor treatment for MRSA infections.     Coagulation Studies: No results for input(s): LABPROT, INR in the last 72 hours.  Urinalysis: No results for input(s): COLORURINE, LABSPEC, PHURINE, GLUCOSEU, HGBUR, BILIRUBINUR, KETONESUR, PROTEINUR, UROBILINOGEN, NITRITE, LEUKOCYTESUR in the last 72 hours.  Invalid input(s): APPERANCEUR    Imaging: Dg Abd 1 View  Result Date: 08/03/2016 CLINICAL DATA:  NG tube placement. EXAM: ABDOMEN - 1 VIEW COMPARISON:  None. FINDINGS: Side port of the NG tube is just above the GE junction and could be advanced for more optimal positioning. There is mild distention of the stomach. Bowel gas pattern is otherwise normal. IMPRESSION: Side port of the NG tube terminates just above the stomach. Electronically Signed   By: San Morelle M.D.   On: 08/03/2016 17:15   Dg Chest Port 1 View  Result Date: 08/05/2016  CLINICAL DATA:  56 year old male with respiratory failure, weakness. Recently intubated. Initial encounter. EXAM: PORTABLE CHEST 1 VIEW COMPARISON:  08/04/2016 and earlier. FINDINGS: Portable AP upright view at at 0410 hours. Extubated. Enteric tube removed. Pacer pads remain over the chest. Decreased veiling opacity at the right lung base but residual streaky peribronchial opacity. Dense retrocardiac opacity remains. Continued increased pulmonary interstitial markings. No pneumothorax. Stable cardiac size and mediastinal contours. IMPRESSION: 1. Extubated and enteric tube removed.  Improved lung volumes. 2. Decreased appearance of right pleural effusion but residual streaky right lower lobe peribronchial opacity. 3. Continued dense retrocardiac atelectasis or consolidation. Probable small left pleural effusion. 4. Continued increased pulmonary interstitial markings which could reflect viral/atypical  respiratory infection or interstitial edema. Electronically Signed   By: Genevie Ann M.D.   On: 08/05/2016 07:02   Dg Chest Port 1 View  Result Date: 08/04/2016 CLINICAL DATA:  Respiratory failure EXAM: PORTABLE CHEST 1 VIEW COMPARISON:  08/03/2016 FINDINGS: The endotracheal tube tip is 3.8 cm above the carina. The nasogastric tube extends well into the stomach. Central and basilar opacities persist, with mild worsening in the bases. Probable small right pleural effusion. IMPRESSION: 1.  Support equipment appears satisfactorily positioned. 2. Mild worsening of basilar airspace opacities. Small right pleural effusion. Electronically Signed   By: Andreas Newport M.D.   On: 08/04/2016 04:43     Medications:    . amLODipine  10 mg Oral Daily  . calcium acetate  1,334 mg Oral TID WC  . carbamazepine  500 mg Oral BID  . cloNIDine  0.2 mg Oral TID  . dextrose  1 ampule Intravenous Once  . heparin  5,000 Units Subcutaneous Q8H  . Influenza vac split quadrivalent PF  0.5 mL Intramuscular Tomorrow-1000  . insulin aspart  0-15 Units Subcutaneous TID WC  . insulin aspart  0-5 Units Subcutaneous QHS  . pneumococcal 23 valent vaccine  0.5 mL Intramuscular Tomorrow-1000   ipratropium-albuterol, nitroGLYCERIN  Assessment/ Plan:  56 y.o.african American male  With schizophrenia, hypertension, hepatitis C, tobacco abuse, anemia of chronic kidney disease, secondary hyperparathyroidism, end-stage renal disease   [facility] M[facility] [place].   1.  End-stage renal disease 2.  Severe hyperkalemia, life threatening 3.  Acute respiratory failure, extubated 3/6 4.  Anemia of chronic kidney disease. Hgb 11.3  Patient presents with critical hyperkalemia - Confirmed from outpatient unit that he did NOT miss his treatment He completed his full treatment on Friday Potassium high today despite treatment yesterday. Concern about re-circulation Will request vascular consult for Angiogram Extra treatment today to  correct potassium       LOS: 2 Todd Brown 3/7/20182:31 PM

## 2016-08-05 NOTE — Progress Notes (Signed)
PT Cancellation Note  Patient Details Name: Todd Brown MRN: [id number] DOB: May 06, 1961   Cancelled Treatment:    Reason Eval/Treat Not Completed: Patient at procedure or test/unavailable (pt currently off unit for dialysis, will reattempt when available )   Orlene Och Student PT 08/05/2016, 1:41 PM

## 2016-08-05 NOTE — Progress Notes (Signed)
56 year old male with end-stage renal disease admitted via emergency department after prolonged cardiac arrest. Per report, he missed dialysis recently and called EMS on the morning of admission when he was too weak to ambulate. He suffered bradycardia asystolic arrest in the emergency department due to severe hyperkalemia (8.8). He underwent approximately 30 minutes of ACLS on the day of admission. Pt was intubated and on the vent and now extubated.  D/w dr simonds and pt will be transferred out to telemetry Hospitalist will begin rounding from march 8th.

## 2016-08-05 NOTE — Progress Notes (Signed)
Post hd assessment 

## 2016-08-05 NOTE — Progress Notes (Signed)
Pre hd info 

## 2016-08-06 ENCOUNTER — Encounter
Admission: EM | Disposition: A | Discharge status: Home Health | Payer: Self-pay | Source: Home / Self Care | Attending: Internal Medicine

## 2016-08-06 DIAGNOSIS — T82868A Thrombosis of vascular prosthetic devices, implants and grafts, initial encounter: Secondary | ICD-10-CM

## 2016-08-06 DIAGNOSIS — E119 Type 2 diabetes mellitus without complications: Secondary | ICD-10-CM

## 2016-08-06 DIAGNOSIS — I10 Essential (primary) hypertension: Secondary | ICD-10-CM

## 2016-08-06 DIAGNOSIS — E875 Hyperkalemia: Secondary | ICD-10-CM

## 2016-08-06 DIAGNOSIS — N186 End stage renal disease: Secondary | ICD-10-CM

## 2016-08-06 HISTORY — PX: A/V SHUNT INTERVENTION: CATH118220

## 2016-08-06 HISTORY — PX: A/V FISTULAGRAM: CATH118298

## 2016-08-06 LAB — BASIC METABOLIC PANEL
Anion gap: 12 (ref 5–15)
BUN: 22 mg/dL — ABNORMAL HIGH (ref 6–20)
CHLORIDE: 97 mmol/L — AB (ref 101–111)
CO2: 26 mmol/L (ref 22–32)
Calcium: 9.1 mg/dL (ref 8.9–10.3)
Creatinine, Ser: 5.07 mg/dL — ABNORMAL HIGH (ref 0.61–1.24)
GFR calc non Af Amer: 12 mL/min — ABNORMAL LOW (ref >60)
GFR, EST AFRICAN AMERICAN: 13 mL/min — AB (ref >60)
Glucose, Bld: 88 mg/dL (ref 65–99)
POTASSIUM: 5.4 mmol/L — AB (ref 3.5–5.1)
SODIUM: 135 mmol/L (ref 135–145)

## 2016-08-06 LAB — GLUCOSE, CAPILLARY
GLUCOSE-CAPILLARY: 89 mg/dL (ref 65–99)
GLUCOSE-CAPILLARY: 100 mg/dL — AB (ref 65–99)
Glucose-Capillary: 120 mg/dL — ABNORMAL HIGH (ref 65–99)

## 2016-08-06 SURGERY — A/V SHUNT INTERVENTION
Anesthesia: Moderate Sedation

## 2016-08-06 MED ORDER — LIDOCAINE-EPINEPHRINE (PF) 2 %-1:200000 IJ SOLN
INTRAMUSCULAR | Status: AC
  Filled 2016-08-06: qty 20

## 2016-08-06 MED ORDER — HEPARIN (PORCINE) IN NACL 2-0.9 UNIT/ML-% IJ SOLN
INTRAMUSCULAR | Status: AC
  Filled 2016-08-06: qty 1000

## 2016-08-06 MED ORDER — HYDROMORPHONE HCL 1 MG/ML IJ SOLN
2.0000 mg | Freq: Once | INTRAMUSCULAR | Status: AC
  Administered 2016-08-06: 2 mg via INTRAVENOUS
  Filled 2016-08-06: qty 2

## 2016-08-06 MED ORDER — FLUPHENAZINE HCL 2.5 MG PO TABS
2.5000 mg | ORAL_TABLET | Freq: Every day | ORAL | Status: DC
  Administered 2016-08-07 – 2016-08-18 (×12): 2.5 mg via ORAL
  Filled 2016-08-06 (×12): qty 1

## 2016-08-06 MED ORDER — IBUPROFEN 600 MG PO TABS
600.0000 mg | ORAL_TABLET | Freq: Four times a day (QID) | ORAL | Status: DC | PRN
  Filled 2016-08-06: qty 1

## 2016-08-06 MED ORDER — MIDAZOLAM HCL 5 MG/5ML IJ SOLN
INTRAMUSCULAR | Status: AC
  Filled 2016-08-06: qty 5

## 2016-08-06 MED ORDER — CEFAZOLIN IN D5W 1 GM/50ML IV SOLN
1.0000 g | Freq: Once | INTRAVENOUS | Status: AC
  Administered 2016-08-06: 1 g via INTRAVENOUS
  Filled 2016-08-06: qty 50

## 2016-08-06 MED ORDER — FENTANYL CITRATE (PF) 100 MCG/2ML IJ SOLN
INTRAMUSCULAR | Status: DC | PRN
  Administered 2016-08-06: 50 ug via INTRAVENOUS

## 2016-08-06 MED ORDER — TRAMADOL HCL 50 MG PO TABS
50.0000 mg | ORAL_TABLET | Freq: Two times a day (BID) | ORAL | Status: DC | PRN
  Filled 2016-08-06: qty 1

## 2016-08-06 MED ORDER — MIDAZOLAM HCL 2 MG/2ML IJ SOLN
INTRAMUSCULAR | Status: DC | PRN
  Administered 2016-08-06: 2 mg via INTRAVENOUS

## 2016-08-06 MED ORDER — HYDROCODONE-ACETAMINOPHEN 5-325 MG PO TABS
1.0000 | ORAL_TABLET | ORAL | Status: DC | PRN
  Administered 2016-08-06 – 2016-08-16 (×47): 1 via ORAL
  Filled 2016-08-06 (×48): qty 1

## 2016-08-06 MED ORDER — FENTANYL CITRATE (PF) 100 MCG/2ML IJ SOLN
INTRAMUSCULAR | Status: AC
  Filled 2016-08-06: qty 2

## 2016-08-06 MED ORDER — HEPARIN SODIUM (PORCINE) 1000 UNIT/ML IJ SOLN
INTRAMUSCULAR | Status: AC
  Filled 2016-08-06: qty 1

## 2016-08-06 MED ORDER — NEPRO/CARBSTEADY PO LIQD
237.0000 mL | Freq: Two times a day (BID) | ORAL | Status: DC
  Administered 2016-08-07 – 2016-08-18 (×19): 237 mL via ORAL

## 2016-08-06 MED ORDER — MORPHINE SULFATE (PF) 4 MG/ML IV SOLN
2.0000 mg | INTRAVENOUS | Status: DC | PRN
  Administered 2016-08-07: 2 mg via INTRAVENOUS
  Filled 2016-08-06: qty 1

## 2016-08-06 MED ORDER — ACETAMINOPHEN 325 MG PO TABS
650.0000 mg | ORAL_TABLET | Freq: Four times a day (QID) | ORAL | Status: DC | PRN
  Filled 2016-08-06: qty 2

## 2016-08-06 MED ORDER — IOPAMIDOL (ISOVUE-300) INJECTION 61%
INTRAVENOUS | Status: DC | PRN
  Administered 2016-08-06: 30 mL via INTRA_ARTERIAL

## 2016-08-06 SURGICAL SUPPLY — 4 items
CANNULA 5F STIFF (CANNULA) ×3 IMPLANT
PACK ANGIOGRAPHY (CUSTOM PROCEDURE TRAY) ×3 IMPLANT
SHEATH BRITE TIP 6FRX5.5 (SHEATH) ×3 IMPLANT
TOWEL OR 17X26 4PK STRL BLUE (TOWEL DISPOSABLE) ×3 IMPLANT

## 2016-08-06 NOTE — Progress Notes (Signed)
PT Cancellation Note  Patient Details Name: MYKEL SPONAUGLE MRN: [id number] DOB: [birth year]   Cancelled Treatment:    Reason Eval/Treat Not Completed: Medical issues which prohibited therapy (chart reviewed pt's potassium level is 5.4, PT eval/treatment contraindicated per PT protocol, will monitor and reattempt when medically appropriate )   Orlene Och Student PT 08/06/2016, 3:14 PM

## 2016-08-06 NOTE — Consult Note (Signed)
[facility] Admission History & Physical  MRN : [id number]  Todd Brown is a 56 y.o. (02-13-1961) male who presents with chief complaint of  Chief Complaint  Patient presents with  . Weakness  .  History of Present Illness: I am asked to evaluate the patient by the dialysis center. The patient was sent here because they were unable to achieve adequate dialysis this morning. Furthermore the Center states there is very poor thrill and bruit. The patient states there there have been increasing problems with the access, such as low flow and his K has been elevated despite dialysis. The patient estimates these problems have been going on for several weeks. The patient is unaware of any other change.  Patient denies pain or tenderness overlying the access.  There is no pain with dialysis.  The patient denies hand pain or finger pain consistent with steal syndrome.   There have been past interventions or declots of this access.  The patient is not chronically hypotensive on dialysis.  Current Facility-Administered Medications  Medication Dose Route Frequency Provider Last Rate Last Dose  . [MAR Hold] acetaminophen (TYLENOL) tablet 650 mg  650 mg Oral Q6H PRN Epifanio Lesches, MD      . Doug Sou Hold] amLODipine (NORVASC) tablet 10 mg  10 mg Oral Daily Wilhelmina Mcardle, MD   10 mg at 08/05/16 1047  . [MAR Hold] calcium acetate (PHOSLO) capsule 1,334 mg  1,334 mg Oral TID WC Wilhelmina Mcardle, MD   1,334 mg at 08/06/16 [phone]  . [MAR Hold] carbamazepine (TEGRETOL) tablet 500 mg  500 mg Oral BID Holley Raring, NP   500 mg at 08/05/16 2023  . [MAR Hold] cloNIDine (CATAPRES) tablet 0.2 mg  0.2 mg Oral TID Bincy S Varughese, NP   0.2 mg at 08/05/16 2022  . [MAR Hold] dextrose 50 % solution 50 mL  1 ampule Intravenous Once Wilhelmina Mcardle, MD      . Doug Sou Hold] fluPHENAZine (PROLIXIN) tablet 2.5 mg  2.5 mg Oral Daily Epifanio Lesches, MD   Stopped at 08/06/16 1244  . [MAR  Hold] heparin injection 5,000 Units  5,000 Units Subcutaneous Q8H Wilhelmina Mcardle, MD   5,000 Units at 08/06/16 1349  . [MAR Hold] Influenza vac split quadrivalent PF (FLUARIX) injection 0.5 mL  0.5 mL Intramuscular Tomorrow-1000 Wilhelmina Mcardle, MD      . Doug Sou Hold] insulin aspart (novoLOG) injection 0-15 Units  0-15 Units Subcutaneous TID WC Wilhelmina Mcardle, MD   2 Units at 08/05/16 1225  . [MAR Hold] insulin aspart (novoLOG) injection 0-5 Units  0-5 Units Subcutaneous QHS Wilhelmina Mcardle, MD      . Doug Sou Hold] ipratropium-albuterol (DUONEB) 0.5-2.5 (3) MG/3ML nebulizer solution 3 mL  3 mL Nebulization Q4H PRN Wilhelmina Mcardle, MD      . Doug Sou Hold] nitroGLYCERIN (NITROSTAT) SL tablet 0.4 mg  0.4 mg Sublingual Q5 min PRN Holley Raring, NP   0.4 mg at 08/04/16 2128  . [MAR Hold] pneumococcal 23 valent vaccine (PNU-IMMUNE) injection 0.5 mL  0.5 mL Intramuscular Tomorrow-1000 Wilhelmina Mcardle, MD      . Doug Sou Hold] traMADol Veatrice Bourbon) tablet 50 mg  50 mg Oral Q12H PRN Epifanio Lesches, MD        Past Medical History:  Diagnosis Date  . Anemia   . Diabetes mellitus without complication ([place])   . Dialysis patient [facility])    Mon. -Wed.- Fri  . ESRD (end  stage renal disease) ([place])   . Hyperlipidemia   . Malignant hypertension   . Renal artery stenosis ([place])   . Schizophrenia [facility])     Past Surgical History:  Procedure Laterality Date  . AV FISTULA PLACEMENT Left 12/26/2014   Procedure: ARTERIOVENOUS (AV) FISTULA CREATION;  Surgeon: Algernon Huxley, MD;  Location: [facility] ORS;  Service: Vascular;  Laterality: Left;  . INSERTION OF DIALYSIS CATHETER Right   . PERIPHERAL VASCULAR CATHETERIZATION N/A 10/22/2014   Procedure: Dialysis/Perma Catheter Insertion;  Surgeon: Algernon Huxley, MD;  Location: [place] [facility];  Service: Cardiovascular;  Laterality: N/A;  . PERIPHERAL VASCULAR CATHETERIZATION N/A 02/28/2015   Procedure: Dialysis/Perma Catheter Removal;  Surgeon: Algernon Huxley, MD;  Location: [place] [facility];  Service: Cardiovascular;  Laterality: N/A;  . Repair fx left lower leg     yrs ago (age 55)    Social History Social History  Substance Use Topics  . Smoking status: Current Every Day Smoker    Packs/day: 0.50    Types: Cigarettes  . Smokeless tobacco: Never Used     Comment: 10 cigars a day  . Alcohol use No    Family History Family History  Problem Relation Age of Onset  . Diabetes Neg Hx     No family history of bleeding or clotting disorders, autoimmune disease or porphyria  Allergies  Allergen Reactions  . Thorazine [Chlorpromazine] Other (See Comments)    Reaction:  Unknown , pt states it makes him feel real bad     REVIEW OF SYSTEMS (Negative unless checked)  Constitutional: [] Weight loss  [] Fever  [] Chills Cardiac: [x] Chest pain   [] Chest pressure   [] Palpitations   [] Shortness of breath when laying flat   [] Shortness of breath at rest   [x] Shortness of breath with exertion. Vascular:  [] Pain in legs with walking   [] Pain in legs at rest   [] Pain in legs when laying flat   [] Claudication   [] Pain in feet when walking  [] Pain in feet at rest  [] Pain in feet when laying flat   [] History of DVT   [] Phlebitis   [] Swelling in legs   [] Varicose veins   [] Non-healing ulcers Pulmonary:   [] Uses home oxygen   [] Productive cough   [] Hemoptysis   [] Wheeze  [] COPD   [] Asthma Neurologic:  [] Dizziness  [] Blackouts   [] Seizures   [] History of stroke   [] History of TIA  [] Aphasia   [] Temporary blindness   [] Dysphagia   [] Weakness or numbness in arms   [] Weakness or numbness in legs Musculoskeletal:  [] Arthritis   [] Joint swelling   [] Joint pain   [] Low back pain Hematologic:  [] Easy bruising  [] Easy bleeding   [] Hypercoagulable state   [] Anemic  [] Hepatitis Gastrointestinal:  [] Blood in stool   [] Vomiting blood  [] Gastroesophageal reflux/heartburn   [] Difficulty swallowing. Genitourinary:  [x] Chronic kidney disease   [] Difficult urination  [] Frequent urination   [] Burning with urination   [] Blood in urine Skin:  [] Rashes   [] Ulcers   [] Wounds Psychological:  [] History of anxiety   []  History of major depression.  Physical Examination  Vitals:   08/06/16 0500 08/06/16 0742 08/06/16 1122 08/06/16 1135  BP:  (!) 145/71 (!) 148/81   Pulse:  60 70   Resp:  18 16   Temp:  99.2 F (37.3 C) 97.9 F (36.6 C)   TempSrc:  Oral Oral   SpO2:  92% (!) 82% 91%  Weight: 85.7 kg (189 lb)  Height:       Body mass index is 24.94 kg/m. Gen: WD/WN, NAD Head: [place]/AT, No temporalis wasting. Prominent temp pulse not noted. Ear/Nose/Throat: Hearing grossly intact, nares w/o erythema or drainage, oropharynx w/o Erythema/Exudate,  Eyes: Conjunctiva clear, sclera non-icteric Neck: Trachea midline.  No JVD.  Pulmonary:  Good air movement, respirations not labored, no use of accessory muscles.  Cardiac: RRR, normal S1, S2. Vascular: thrill present in left arm AVF Vessel Right Left  Radial Palpable Palpable  Ulnar Not Palpable Not Palpable  Brachial Palpable Palpable  Carotid Palpable, without bruit Palpable, without bruit  Gastrointestinal: soft, non-tender/non-distended. No guarding/reflex.  Musculoskeletal: M/S 5/5 throughout.  Extremities without ischemic changes.  No deformity or atrophy.  Neurologic: Sensation grossly intact in extremities.  Symmetrical.  Speech is fluent. Motor exam as listed above. Psychiatric: Judgment intact, Mood & affect appropriate for pt's clinical situation. Dermatologic: No rashes or ulcers noted.  No cellulitis or open wounds. Lymph : No Cervical, Axillary, or Inguinal lymphadenopathy.   CBC Lab Results  Component Value Date   WBC 12.1 (H) 08/05/2016   HGB 11.3 (L) 08/05/2016   HCT 33.2 (L) 08/05/2016   MCV 104.2 (H) 08/05/2016   PLT 146 (L) 08/05/2016    BMET    Component Value Date/Time   NA 135 08/06/2016 0527   K 5.4 (H) 08/06/2016 0527   CL 97 (L) 08/06/2016 0527   CO2 26 08/06/2016 0527   GLUCOSE 88  08/06/2016 0527   BUN 22 (H) 08/06/2016 0527   CREATININE 5.07 (H) 08/06/2016 0527   CREATININE 3.47 (H) 03/23/2013 1501   CALCIUM 9.1 08/06/2016 0527   CALCIUM 8.1 (L) 10/22/2014 0504   GFRNONAA 12 (L) 08/06/2016 0527   GFRAA 13 (L) 08/06/2016 0527   Estimated Creatinine Clearance: 18.6 mL/min (by C-G formula based on SCr of 5.07 mg/dL (H)).  COAG Lab Results  Component Value Date   INR 1.14 05/26/2016   INR 0.95 12/06/2014   INR 0.97 03/23/2013    Radiology Dg Abd 1 View  Result Date: 08/03/2016 CLINICAL DATA:  NG tube placement. EXAM: ABDOMEN - 1 VIEW COMPARISON:  None. FINDINGS: Side port of the NG tube is just above the GE junction and could be advanced for more optimal positioning. There is mild distention of the stomach. Bowel gas pattern is otherwise normal. IMPRESSION: Side port of the NG tube terminates just above the stomach. Electronically Signed   By: San Morelle M.D.   On: 08/03/2016 17:15   Dg Chest Port 1 View  Result Date: 08/05/2016 CLINICAL DATA:  56 year old male with respiratory failure, weakness. Recently intubated. Initial encounter. EXAM: PORTABLE CHEST 1 VIEW COMPARISON:  08/04/2016 and earlier. FINDINGS: Portable AP upright view at at 0410 hours. Extubated. Enteric tube removed. Pacer pads remain over the chest. Decreased veiling opacity at the right lung base but residual streaky peribronchial opacity. Dense retrocardiac opacity remains. Continued increased pulmonary interstitial markings. No pneumothorax. Stable cardiac size and mediastinal contours. IMPRESSION: 1. Extubated and enteric tube removed.  Improved lung volumes. 2. Decreased appearance of right pleural effusion but residual streaky right lower lobe peribronchial opacity. 3. Continued dense retrocardiac atelectasis or consolidation. Probable small left pleural effusion. 4. Continued increased pulmonary interstitial markings which could reflect viral/atypical respiratory infection or interstitial  edema. Electronically Signed   By: Genevie Ann M.D.   On: 08/05/2016 07:02   Dg Chest Port 1 View  Result Date: 08/04/2016 CLINICAL DATA:  Respiratory failure EXAM: PORTABLE CHEST 1 VIEW  COMPARISON:  08/03/2016 FINDINGS: The endotracheal tube tip is 3.8 cm above the carina. The nasogastric tube extends well into the stomach. Central and basilar opacities persist, with mild worsening in the bases. Probable small right pleural effusion. IMPRESSION: 1.  Support equipment appears satisfactorily positioned. 2. Mild worsening of basilar airspace opacities. Small right pleural effusion. Electronically Signed   By: Andreas Newport M.D.   On: 08/04/2016 04:43   Dg Chest Portable 1 View  Result Date: 08/03/2016 CLINICAL DATA:  Weak, post intubation EXAM: PORTABLE CHEST 1 VIEW COMPARISON:  05/30/2016 FINDINGS: Pacer patches obscure portions of the chest. Endotracheal tube tip is at the thoracic inlet. There is mild cardiomegaly. Diffuse mildly reticular interstitial opacities are present, which may reflect pulmonary edema or possible infection. No large effusion. No focal consolidation. No pneumothorax. IMPRESSION: 1. Endotracheal tube tip is at the thoracic inlet 2. Cardiomegaly. Diffuse mildly reticular interstitial opacities suggestive of pulmonary edema. Electronically Signed   By: Donavan Foil M.D.   On: 08/03/2016 14:34    Assessment/Plan 1.  Complication dialysis device with thrombosis AV access:  Patient's left arm dialysis access is malfunctioning. The patient will undergo angiography and correction of any problems using interventional techniques with the hope of restoring function to the access.  The risks and benefits were described to the patient.  All questions were answered.  The patient agrees to proceed with angiography and intervention. He has had high potassium despite dialysis 2.  End-stage renal disease requiring hemodialysis:  Patient will continue dialysis therapy without further interruption if  a successful intervention is not achieved then a tunneled catheter will be placed. Dialysis has already been arranged. 3.  Hypertension:  Patient will continue medical management; nephrology is following no changes in oral medications. 4. Diabetes mellitus:  Glucose will be monitored and oral medications been held this morning once the patient has undergone the patient's procedure po intake will be reinitiated and again Accu-Cheks will be used to assess the blood glucose level and treat as needed. The patient will be restarted on the patient's usual hypoglycemic regime     Leotis Pain, MD  08/06/2016 4:10 PM

## 2016-08-06 NOTE — Progress Notes (Signed)
Pt returned to the unit at [place]. Per report given, pt has fistulogram only done, is positive fro bruit and thrill. Pt had diet reinstated on return, ordered dinner. Pt in no distress. Call bell in reach.

## 2016-08-06 NOTE — Op Note (Signed)
[facility]    OPERATIVE NOTE   PROCEDURE: 1.   Left brahciocephalic arteriovenous fistula cannulation under ultrasound guidance 2.   Left arm fistulagram including central venogram  PRE-OPERATIVE DIAGNOSIS: 1. ESRD 2. Hyperkalemia  POST-OPERATIVE DIAGNOSIS: same as above   SURGEON: Leotis Pain, MD  ANESTHESIA: local with MCS  ESTIMATED BLOOD LOSS: minimal  FINDING(S): 1. Normal A-V fistula without any areas of significant stenosis. Normal anastomosis. Normal central venous circulation. No obvious cause of dysfunction AV fistula.  SPECIMEN(S):  None  CONTRAST: 30 cc  FLUORO TIME: Less than 1 minute  MODERATE CONSCIOUS SEDATION TIME: Approximately 15 minutes with 2 mg of Versed and 50 mcg of Fentanyl   INDICATIONS: Todd Brown is a 56 y.o. male who presents with malfunctioning left brachiocephalic arteriovenous fistula with hyperkalemia.  The patient is aware the risks include but are not limited to: bleeding, infection, thrombosis of the cannulated access, and possible anaphylactic reaction to the contrast.  The patient is aware of the risks of the procedure and elects to proceed forward.  DESCRIPTION: After full informed written consent was obtained, the patient was brought back to the angiography suite and placed supine upon the angiography table.  The patient was connected to monitoring equipment. Moderate conscious sedation was administered with a face to face encounter with the patient throughout the procedure with my supervision of the RN administering medicines and monitoring the patient's vital signs and mental status throughout from the start of the procedure until the patient was taken to the recovery room. The left arm was prepped and draped in the standard fashion for a percutaneous access intervention.  Under ultrasound guidance, the left brachiocephalic arteriovenous fistula was cannulated with a micropuncture needle under direct ultrasound  guidance and a permanent image was performed.  The microwire was advanced into the fistula and the needle was exchanged for the a microsheath.  I then upsized to a 6 Fr Sheath and imaging was performed.  Hand injections were completed to image the access including the central venous system. This demonstrated Normal A-V fistula without any areas of significant stenosis. Normal anastomosis. Normal central venous circulation. No obvious cause of dysfunction AV fistula.  Based on the images, this patient will need no intervention. A 4-0 Monocryl purse-string suture was sewn around the sheath.  The sheath was removed while tying down the suture.  A sterile bandage was applied to the puncture site.  COMPLICATIONS: None  CONDITION: Stable   Leotis Pain  08/06/2016 4:49 PM   This note was created with Dragon Medical transcription system. Any errors in dictation are purely unintentional.

## 2016-08-06 NOTE — Progress Notes (Signed)
Subjective:  Patient seen earlier today Doing better today Potassium still high despite daily dialysis   Objective:  Vital signs in last 24 hours:  Temp:  [97.5 F (36.4 C)-99.2 F (37.3 C)] 97.9 F (36.6 C) (03/08 1122) Pulse Rate:  [48-70] 70 (03/08 1122) Resp:  [10-18] 16 (03/08 1122) BP: (109-162)/(61-81) 148/81 (03/08 1122) SpO2:  [82 %-98 %] 91 % (03/08 1135) Weight:  [80.7 kg (177 lb 14.6 oz)-85.7 kg (189 lb)] 85.7 kg (189 lb) (03/08 0500)  Weight change: -4.3 kg (-9 lb 7.7 oz) Filed Weights   08/05/16 1349 08/05/16 1659 08/06/16 0500  Weight: 81.6 kg (179 lb 14.3 oz) 80.7 kg (177 lb 14.6 oz) 85.7 kg (189 lb)    Intake/Output:    Intake/Output Summary (Last 24 hours) at 08/06/16 1551 Last data filed at 08/06/16 1350  Gross per 24 hour  Intake              530 ml  Output             1750 ml  Net            -1220 ml     Physical Exam: General: Chronically ill appearing  HEENT Anicteric,  Neck supple  Pulm/lungs Coarse breath sound bilaterally,   CVS/Heart Regular rhythm,   Abdomen:  Soft, nontender, nondistended  Extremities: Trace edema  Neurologic: sedated  Skin: Dry skin  Access: Left upper arm AV fistula       Basic Metabolic Panel:   Recent Labs Lab 08/03/16 1316 08/03/16 1628 08/03/16 1923 08/04/16 0207 08/05/16 0514 08/06/16 0527  NA 128*  --   --  137 140 135  K >7.5* 6.1* 3.8 5.5* 5.4* 5.4*  CL 98*  --   --  99* 103 97*  CO2 20*  --   --  29 26 26   GLUCOSE 108*  --   --  77 81 88  BUN 54*  --   --  28* 24* 22*  CREATININE 7.90*  --   --  5.08* 4.94* 5.07*  CALCIUM 9.1  --   --  8.5* 8.5* 9.1  MG  --   --  1.9  --   --   --      CBC:  Recent Labs Lab 08/03/16 1316 08/04/16 0237 08/05/16 0514  WBC 9.2 12.3* 12.1*  NEUTROABS 7.9*  --   --   HGB 12.7* 12.0* 11.3*  HCT 37.1* 34.7* 33.2*  MCV 107.0* 106.6* 104.2*  PLT 282 159 146*      Microbiology:  Recent Results (from the past 720 hour(s))  MRSA PCR Screening      Status: None   Collection Time: 08/03/16  6:09 PM  Result Value Ref Range Status   MRSA by PCR NEGATIVE NEGATIVE Final    Comment:        The GeneXpert MRSA Assay (FDA approved for NASAL specimens only), is one component of a comprehensive MRSA colonization surveillance program. It is not intended to diagnose MRSA infection nor to guide or monitor treatment for MRSA infections.     Coagulation Studies: No results for input(s): LABPROT, INR in the last 72 hours.  Urinalysis: No results for input(s): COLORURINE, LABSPEC, PHURINE, GLUCOSEU, HGBUR, BILIRUBINUR, KETONESUR, PROTEINUR, UROBILINOGEN, NITRITE, LEUKOCYTESUR in the last 72 hours.  Invalid input(s): APPERANCEUR    Imaging: Dg Chest Port 1 View  Result Date: 08/05/2016 CLINICAL DATA:  55 year old male with respiratory failure, weakness. Recently intubated. Initial encounter. EXAM: PORTABLE CHEST  1 VIEW COMPARISON:  08/04/2016 and earlier. FINDINGS: Portable AP upright view at at 0410 hours. Extubated. Enteric tube removed. Pacer pads remain over the chest. Decreased veiling opacity at the right lung base but residual streaky peribronchial opacity. Dense retrocardiac opacity remains. Continued increased pulmonary interstitial markings. No pneumothorax. Stable cardiac size and mediastinal contours. IMPRESSION: 1. Extubated and enteric tube removed.  Improved lung volumes. 2. Decreased appearance of right pleural effusion but residual streaky right lower lobe peribronchial opacity. 3. Continued dense retrocardiac atelectasis or consolidation. Probable small left pleural effusion. 4. Continued increased pulmonary interstitial markings which could reflect viral/atypical respiratory infection or interstitial edema. Electronically Signed   By: Genevie Ann M.D.   On: 08/05/2016 07:02     Medications:    . [MAR Hold] amLODipine  10 mg Oral Daily  . [MAR Hold] calcium acetate  1,334 mg Oral TID WC  . [MAR Hold] carbamazepine  500 mg Oral  BID  . [MAR Hold] cloNIDine  0.2 mg Oral TID  . [MAR Hold] dextrose  1 ampule Intravenous Once  . [MAR Hold] fluPHENAZine  2.5 mg Oral Daily  . [MAR Hold] heparin  5,000 Units Subcutaneous Q8H  . [MAR Hold] Influenza vac split quadrivalent PF  0.5 mL Intramuscular Tomorrow-1000  . [MAR Hold] insulin aspart  0-15 Units Subcutaneous TID WC  . [MAR Hold] insulin aspart  0-5 Units Subcutaneous QHS  . [MAR Hold] pneumococcal 23 valent vaccine  0.5 mL Intramuscular Tomorrow-1000   [MAR Hold] acetaminophen, [MAR Hold] ipratropium-albuterol, [MAR Hold] nitroGLYCERIN, [MAR Hold] traMADol  Assessment/ Plan:  56 y.o.african American male  With schizophrenia, hypertension, hepatitis C, tobacco abuse, anemia of chronic kidney disease, secondary hyperparathyroidism, end-stage renal disease   [facility] M[facility] [place].   1.  End-stage renal disease 2.  Severe hyperkalemia, life threatening 3.  Acute respiratory failure, extubated 3/6 4.  Anemia of chronic kidney disease. Hgb 11.3  Patient presents with critical hyperkalemia - Confirmed from outpatient unit that he did NOT miss his treatment He completed his full treatment on Friday Potassium high today despite treatment yesterday. Concern about re-circulation Angiogram today Dialysis tomorrow      LOS: 3 Zacherie Honeyman 3/8/20183:51 PM

## 2016-08-06 NOTE — Progress Notes (Signed)
[facility] [place] NAME: Todd Brown    MR#:  [id number]  DATE OF BIRTH:  [birth year]  SUBJECTIVE: Transfer from ICU service yesterday, patient was intubated and admitted to intensive care unit on March 5 after prolonged cardiac arrest secondary to severe hyperkalemia patient had 30 minutes of ACLS, admitted to intensive care unit, extubated, transferred to floor,   Patient is seen for the first time . able to answer the questions but mental slowing noted. Potassium 5.4. Getting fistula evaluation today.   CHIEF COMPLAINT:   Chief Complaint  Patient presents with  . Weakness    REVIEW OF SYSTEMS:   Review of Systems  Constitutional: Negative for chills and fever.  HENT: Negative for hearing loss.   Eyes: Negative for blurred vision, double vision and photophobia.  Respiratory: Positive for cough. Negative for hemoptysis and shortness of breath.   Cardiovascular: Negative for palpitations, orthopnea and leg swelling.  Gastrointestinal: Negative for abdominal pain, diarrhea and vomiting.  Genitourinary: Negative for dysuria and urgency.  Musculoskeletal: Negative for myalgias and neck pain.  Skin: Negative for rash.  Neurological: Negative for dizziness, focal weakness, seizures, weakness and headaches.  Psychiatric/Behavioral: Negative for memory loss. The patient does not have insomnia.    is very slow to answer but answers appropriately.  DRUG ALLERGIES:   Allergies  Allergen Reactions  . Thorazine [Chlorpromazine] Other (See Comments)    Reaction:  Unknown , pt states it makes him feel real bad    VITALS:  Blood pressure (!) 148/81, pulse 70, temperature 97.9 F (36.6 C), temperature source Oral, resp. rate 16, height 6\' 1"  (1.854 m), weight 85.7 kg (189 lb), SpO2 91 %.  PHYSICAL EXAMINATION:  GENERAL:  56 y.o.-year-old patient lying in the bed with no acute distress.Overall ill-appearing male.verall ill appearing  male.  EYES: Pupils equal, round, reactive to light and accommodation. No scleral icterus. Extraocular muscles intact.  HEENT: Head atraumatic, normocephalic. Oropharynx and nasopharynx clear.  NECK:  Supple, no jugular venous distention. No thyroid enlargement, no tenderness.  LUNGS: Normal breath sounds bilaterally, no wheezing, rales,rhonchi or crepitation. No use of accessory muscles of respiration.  CARDIOVASCULAR: S1, S2 normal. No murmurs, rubs, or gallops.  ABDOMEN: Soft, nontender, nondistended. Bowel sounds present. No organomegaly or mass.  EXTREMITIES: No pedal edema, cyanosis, or clubbing.  NEUROLOGIC:  unable to follow directions for this exam.  PSYCHIATRIC: The patient is alert and oriented x 3.  SKIN: No obvious rash, lesion, or ulcer.    LABORATORY PANEL:   CBC  Recent Labs Lab 08/05/16 0514  WBC 12.1*  HGB 11.3*  HCT 33.2*  PLT 146*   ------------------------------------------------------------------------------------------------------------------  Chemistries   Recent Labs Lab 08/03/16 1316  08/03/16 1923  08/06/16 0527  NA 128*  --   --   < > 135  K >7.5*  < > 3.8  < > 5.4*  CL 98*  --   --   < > 97*  CO2 20*  --   --   < > 26  GLUCOSE 108*  --   --   < > 88  BUN 54*  --   --   < > 22*  CREATININE 7.90*  --   --   < > 5.07*  CALCIUM 9.1  --   --   < > 9.1  MG  --   --  1.9  --   --   AST 49*  --   --   --   --  ALT 47  --   --   --   --   ALKPHOS 120  --   --   --   --   BILITOT 0.7  --   --   --   --   < > = values in this interval not displayed. ------------------------------------------------------------------------------------------------------------------  Cardiac Enzymes  Recent Labs Lab 08/05/16 0514  TROPONINI 0.34*   ------------------------------------------------------------------------------------------------------------------  RADIOLOGY:  Dg Chest Port 1 View  Result Date: 08/05/2016 CLINICAL DATA:  56 year old male with  respiratory failure, weakness. Recently intubated. Initial encounter. EXAM: PORTABLE CHEST 1 VIEW COMPARISON:  08/04/2016 and earlier. FINDINGS: Portable AP upright view at at 0410 hours. Extubated. Enteric tube removed. Pacer pads remain over the chest. Decreased veiling opacity at the right lung base but residual streaky peribronchial opacity. Dense retrocardiac opacity remains. Continued increased pulmonary interstitial markings. No pneumothorax. Stable cardiac size and mediastinal contours. IMPRESSION: 1. Extubated and enteric tube removed.  Improved lung volumes. 2. Decreased appearance of right pleural effusion but residual streaky right lower lobe peribronchial opacity. 3. Continued dense retrocardiac atelectasis or consolidation. Probable small left pleural effusion. 4. Continued increased pulmonary interstitial markings which could reflect viral/atypical respiratory infection or interstitial edema. Electronically Signed   By: Genevie Ann M.D.   On: 08/05/2016 07:02    EKG:   Orders placed or performed during the hospital encounter of 08/03/16  . EKG 12-Lead  . EKG 12-Lead  . EKG 12-Lead  . EKG 12-Lead    ASSESSMENT AND PLAN:   #1 acute respiratory failure secondary to cardiac arrest with prolonged resuscitation with the ACLS protocol was more than 30 minutes as per the chart: Status post intubation, extubation. Physical therapy consult, continue oxygen, patient chest x-ray concerning for possible retrocardiac infiltrate: Continue antibiotics. Acute on chronic diastolic heart failure, severe pulmonary hypertension: #3 ESRD on hemodialysis, continue to have hyperkalemia: Patient seen by nephrology, for vascular access evaluation his angiogram. Patient had breakfast today so patient can have vascular evaluated today TOMORROW morning. Evaluate for recirculation. Patient potassium is high despite full hd treatments.   4/severe life-threatening hyperkalemia: Improved, Potassium  7.5 on March 5 and  today 5.4. #5 history of schizophrenia:  He is on prolixinat home restart that,continue lamictal/ #6 essential hypertension: Controlled  All the records are reviewed and case discussed with Care Management/Social Workerr. Management plans discussed with the patient, family and they are in agreement.  CODE STATUS: full  TOTAL TIME TAKING CARE OF THIS PATIENT: 35 minutes.   POSSIBLE D/C IN 3-4DAYS, DEPENDING ON CLINICAL CONDITION.   Epifanio Lesches M.D on 08/06/2016 at 12:32 PM  Between 7am to 6pm - Pager - [phone]  After 6pm go to [email or web address] - password [email or web address] [facility]  [place] [facility]  Office  [phone]  CC: Primary care physician; No PCP Per Patient   Note: This dictation was prepared with Dragon dictation along with smaller phrase technology. Any transcriptional errors that result from this process are unintentional.

## 2016-08-06 NOTE — Progress Notes (Signed)
This Probation officer assumed care of pt at 1530, pt is awake at that time and is speaking with staff at bedside, o2 at [facility], pt able to speak in complete sentences, pt left the unit at 1546 for vascular.

## 2016-08-06 NOTE — Progress Notes (Signed)
Nutrition Follow-up  DOCUMENTATION CODES:   Not applicable  INTERVENTION:  - Provide Nepro BID, each supplement provides 425 calories and 19.1 grams protein - Will follow-up and provide potassium diet education once appropriate   NUTRITION DIAGNOSIS:   Inadequate oral intake related to inability to eat as evidenced by NPO status.  Progressing, as diet advanced pt consumed 100% of meal  GOAL:   Patient will meet greater than or equal to 90% of their needs  Progressing  MONITOR:   PO intake, Weight trends, Diet advancement, I & O's, Vent status    ASSESSMENT:   56 y.o. Male with PMH of ESRD-on HD, DM type II, HTN presented to the ER s/p cardiac arrest. Pt was intubated with severe HTN, pt was extubated 3/6  Pt was extubated 3/6 and no longer on ventilator support, placed on 2 L O2. Reassessed pt's estimated nutritional needs.   Pt was confused and fatigued at time of visit and could not provide a substantial nutritional history.    Per RN report, pt consumed his first meal, 100% of breakfast this morning. Pt was made NPO to go to procedure of A/V Shunt Intervention.  Pt states he is willing to consume nutritional supplement drinks when his diet is advanced.  Last treatment was yesterday, will do angiogram today, and pt will receive dialysis tomorrow.  Potassium remains elevated, noted pt did not miss HD treatments PTA.  Labs reviewed; CBG (65-162), K (5.4)  Medications reviewed; Calcium acetate   Diet Order:  Diet NPO time specified  Skin:  Reviewed, no issues  Last BM:  no date recorded  Height:   Ht Readings from Last 1 Encounters:  08/03/16 6\' 1"  (1.854 m)    Weight:   Wt Readings from Last 1 Encounters:  08/06/16 189 lb (85.7 kg)    Ideal Body Weight:  83.6 kg  BMI:  Body mass index is 24.94 kg/m.  Estimated Nutritional Needs:   Kcal:  2200-2500  Protein:  105-115 grams (1.2-1.3 g/kg)  Fluid:  Urine output + 1 L/d  EDUCATION NEEDS:    Education needs no appropriate at this time  [employer]

## 2016-08-06 NOTE — Progress Notes (Signed)
Patient has no acute event overnight. HE maintained 02sat above 90 on 2L of supplemental oxygen. NSR and VS WDl for patient . He denied being in pain and rested for most of the night .

## 2016-08-07 ENCOUNTER — Inpatient Hospital Stay: Payer: Medicaid Other

## 2016-08-07 ENCOUNTER — Encounter: Payer: Self-pay | Admitting: Vascular Surgery

## 2016-08-07 LAB — GLUCOSE, CAPILLARY
GLUCOSE-CAPILLARY: 90 mg/dL (ref 65–99)
Glucose-Capillary: 96 mg/dL (ref 65–99)
Glucose-Capillary: 107 mg/dL — ABNORMAL HIGH (ref 65–99)

## 2016-08-07 LAB — BASIC METABOLIC PANEL
ANION GAP: 8 (ref 5–15)
BUN: 32 mg/dL — ABNORMAL HIGH (ref 6–20)
CO2: 28 mmol/L (ref 22–32)
Calcium: 8.5 mg/dL — ABNORMAL LOW (ref 8.9–10.3)
Chloride: 100 mmol/L — ABNORMAL LOW (ref 101–111)
Creatinine, Ser: 6.75 mg/dL — ABNORMAL HIGH (ref 0.61–1.24)
GFR calc non Af Amer: 8 mL/min — ABNORMAL LOW (ref >60)
GFR, EST AFRICAN AMERICAN: 10 mL/min — AB (ref >60)
GLUCOSE: 102 mg/dL — AB (ref 65–99)
POTASSIUM: 4.7 mmol/L (ref 3.5–5.1)
Sodium: 136 mmol/L (ref 135–145)

## 2016-08-07 LAB — PROTIME-INR
INR: 0.93
Prothrombin Time: 12.5 seconds (ref 11.4–15.2)

## 2016-08-07 LAB — APTT: APTT: 37 s — AB (ref 24–36)

## 2016-08-07 MED ORDER — HYDRALAZINE HCL 25 MG PO TABS
25.0000 mg | ORAL_TABLET | Freq: Three times a day (TID) | ORAL | Status: DC
  Administered 2016-08-07 – 2016-08-08 (×3): 25 mg via ORAL
  Filled 2016-08-07 (×3): qty 1

## 2016-08-07 MED ORDER — DOCUSATE SODIUM 100 MG PO CAPS
100.0000 mg | ORAL_CAPSULE | Freq: Every day | ORAL | Status: DC
  Administered 2016-08-07 – 2016-08-18 (×12): 100 mg via ORAL
  Filled 2016-08-07 (×12): qty 1

## 2016-08-07 MED ORDER — HEPARIN BOLUS VIA INFUSION
5000.0000 [IU] | Freq: Once | INTRAVENOUS | Status: AC
  Administered 2016-08-07: 5000 [IU] via INTRAVENOUS
  Filled 2016-08-07: qty 5000

## 2016-08-07 MED ORDER — AMLODIPINE 1 MG/ML ORAL SUSPENSION: 10.0000 mg | Freq: Every day | ORAL | Status: DC

## 2016-08-07 MED ORDER — AMLODIPINE BESYLATE 10 MG PO TABS
10.0000 mg | ORAL_TABLET | Freq: Every day | ORAL | Status: DC
  Administered 2016-08-07 – 2016-08-18 (×12): 10 mg via ORAL
  Filled 2016-08-07 (×12): qty 1

## 2016-08-07 MED ORDER — HEPARIN (PORCINE) IN NACL 100-0.45 UNIT/ML-% IJ SOLN
1500.0000 [IU]/h | INTRAMUSCULAR | Status: DC
  Administered 2016-08-07 – 2016-08-09 (×3): 1500 [IU]/h via INTRAVENOUS
  Filled 2016-08-07 (×3): qty 250

## 2016-08-07 NOTE — Progress Notes (Signed)
Post hd tx 

## 2016-08-07 NOTE — Progress Notes (Signed)
ANTICOAGULATION CONSULT NOTE - Initial Consult  Pharmacy Consult for Heparin  Indication: VTE prophylaxis  Allergies  Allergen Reactions  . Thorazine [Chlorpromazine] Other (See Comments)    Reaction:  Unknown , pt states it makes him feel real bad    Patient Measurements: Height: 6\' 1"  (185.4 cm) Weight: 173 lb 1 oz (78.5 kg) IBW/kg (Calculated) : 79.9 Heparin Dosing Weight: 85.7 kg   Vital Signs: Temp: 99.2 F (37.3 C) (03/09 2011) Temp Source: Oral (03/09 2011) BP: 169/80 (03/09 2011) Pulse Rate: 62 (03/09 2011)  Labs:  Recent Labs  08/05/16 0514 08/06/16 0527 08/07/16 0323  HGB 11.3*  --   --   HCT 33.2*  --   --   PLT 146*  --   --   CREATININE 4.94* 5.07* 6.75*  TROPONINI 0.34*  --   --     Estimated Creatinine Clearance: 13.7 mL/min (by C-G formula based on SCr of 6.75 mg/dL (H)).   Medical History: Past Medical History:  Diagnosis Date  . Anemia   . Diabetes mellitus without complication ([place])   . Dialysis patient [facility])    Mon. -Wed.- Fri  . ESRD (end stage renal disease) ([place])   . Hyperlipidemia   . Malignant hypertension   . Renal artery stenosis ([place])   . Schizophrenia ([place])     Medications:  Prescriptions Prior to Admission  Medication Sig Dispense Refill Last Dose  . amLODipine (NORVASC) 10 MG tablet Take 10 mg by mouth daily.   unknown at unknown  . atenolol (TENORMIN) 25 MG tablet Take 25 mg by mouth 2 (two) times daily.    unknown at unknown  . b complex-vitamin c-folic acid (NEPHRO-VITE) 0.8 MG TABS tablet Take 1 tablet by mouth daily.   unknown at unknown  . calcium acetate (PHOSLO) 667 MG capsule Take 2 capsules (1,334 mg total) by mouth 3 (three) times daily with meals. 60 capsule 1 unknown at unknown  . carbamazepine (TEGRETOL XR) 200 MG 12 hr tablet Take 1,000 mg by mouth every morning.   unknown at unknown  . cholecalciferol (VITAMIN D) 1000 UNITS tablet Take 1,000 Units by mouth daily.   unknown at unknown  . citalopram (CELEXA)  20 MG tablet Take 1 tablet (20 mg total) by mouth daily. 30 tablet 1 unknown at unknown  . cloNIDine (CATAPRES) 0.2 MG tablet Take 1 tablet (0.2 mg total) by mouth 3 (three) times daily. 90 tablet 1 unknown at unknown  . ferrous sulfate 325 (65 FE) MG tablet Take 1 tablet (325 mg total) by mouth daily. 30 tablet 3 unknown at unknown  . fluPHENAZine (PROLIXIN) 2.5 MG tablet Take 2.5 mg by mouth daily.    unknown at unknown  . Fluticasone-Salmeterol (ADVAIR DISKUS) 250-50 MCG/DOSE AEPB Inhale 1 puff into the lungs 2 (two) times daily.   unknown at unknown  . hydrALAZINE (APRESOLINE) 100 MG tablet Take 1 tablet (100 mg total) by mouth every 6 (six) hours. (Patient taking differently: Take 100 mg by mouth 2 (two) times daily. ) 120 tablet 1 unknown at unknown  . minoxidil (LONITEN) 2.5 MG tablet Take 2 tablets (5 mg total) by mouth daily. 60 tablet 1 unknown at unknown  . trihexyphenidyl (ARTANE) 5 MG tablet Take 5 mg by mouth 3 (three) times daily with meals.   unknown at unknown  . acetaminophen (TYLENOL) 500 MG tablet Take 500 mg by mouth every 4 (four) hours as needed for mild pain. Take 1 tablet every 4-6 hours as needed  for pain.   prn at prn  . albuterol (PROAIR HFA) 108 (90 Base) MCG/ACT inhaler Inhale 1-2 puffs into the lungs every 6 (six) hours as needed for wheezing or shortness of breath (cough).   prn at prn  . amoxicillin-clavulanate (AUGMENTIN) 500-125 MG tablet Take 1 tablet (500 mg total) by mouth 3 (three) times daily. 15 tablet 0   . baclofen (LIORESAL) 10 MG tablet Take 1 tablet (10 mg total) by mouth 2 (two) times daily as needed for muscle spasms. 30 each 0 05/25/2016 at Unknown time  . benzonatate (TESSALON) 200 MG capsule Take 1 capsule (200 mg total) by mouth 2 (two) times daily as needed for cough. 20 capsule 0   . calcium acetate (PHOSLO) 667 MG capsule Take 1,334 mg by mouth 3 (three) times daily with meals.    unknown at unknown  . carbamazepine (TEGRETOL) 200 MG tablet Take  2.5 tablets (500 mg total) by mouth 2 (two) times daily. Taken 2 1/2 tabs (500mg  ) twice a day (Patient not taking: Reported on 04/14/2016) 150 tablet 0 Not Taking  . cyclobenzaprine (FLEXERIL) 5 MG tablet Take 5 mg by mouth at bedtime as needed for muscle spasms.   prn at prn  . fluticasone furoate-vilanterol (BREO ELLIPTA) 100-25 MCG/INH AEPB Inhale 1 puff into the lungs daily.   unknown at unknown  . traMADol (ULTRAM) 50 MG tablet Take 1 tablet (50 mg total) by mouth every 6 (six) hours as needed. 30 tablet 0 unknown at Unknown time    Assessment: Pharmacy consulted to dose heparin in this 56 year old male with VTE.   CrCl = 13.7 ml/min.    Pt received heparin 5000 units SQ on 3/9 @ 0530.   Goal of Therapy:  Heparin level 0.3-0.7 units/ml Monitor platelets by anticoagulation protocol: Yes   Plan:  Give 5000 units bolus x 1 Start heparin infusion at 1500 units/hr Check anti-Xa level in 8 hours and daily while on heparin Continue to monitor H&H and platelets  Layah Skousen D 08/07/2016,9:21 PM

## 2016-08-07 NOTE — Progress Notes (Signed)
Heparin gtt started, bolus given and verified by Baldo Ash, RN

## 2016-08-07 NOTE — Progress Notes (Signed)
[facility] [place] NAME: Todd Brown    MR#:  [id number]  DATE OF BIRTH:  January 15, 1961  SUBJECTIVE: Transfer from ICU service yesterday, patient was intubated and admitted to intensive care unit on March 5 after prolonged cardiac arrest secondary to severe hyperkalemia patient had 30 minutes of ACLS, admitted to intensive care unit, extubated, transferred to floor,    During hemodialysis today, awake and alert denies any complaints. On 3 L of oxygen, has bradycardia with heart rate 49.   CHIEF COMPLAINT:   Chief Complaint  Patient presents with  . Weakness    REVIEW OF SYSTEMS:   Review of Systems  Constitutional: Negative for chills and fever.  HENT: Negative for hearing loss.   Eyes: Negative for blurred vision, double vision and photophobia.  Respiratory: Positive for cough. Negative for hemoptysis and shortness of breath.   Cardiovascular: Negative for palpitations, orthopnea and leg swelling.  Gastrointestinal: Negative for abdominal pain, diarrhea and vomiting.  Genitourinary: Negative for dysuria and urgency.  Musculoskeletal: Negative for myalgias and neck pain.  Skin: Negative for rash.  Neurological: Negative for dizziness, focal weakness, seizures, weakness and headaches.  Psychiatric/Behavioral: Negative for memory loss. The patient does not have insomnia.    is very slow to answer but answers appropriately.  DRUG ALLERGIES:   Allergies  Allergen Reactions  . Thorazine [Chlorpromazine] Other (See Comments)    Reaction:  Unknown , pt states it makes him feel real bad    VITALS:  Blood pressure (!) 177/76, pulse (!) 56, temperature 98.4 F (36.9 C), temperature source Oral, resp. rate 11, height 6\' 1"  (1.854 m), weight 78.5 kg (173 lb 1 oz), SpO2 91 %.  PHYSICAL EXAMINATION:  GENERAL:  56 y.o.-year-old patient lying in the bed with no acute distress.Overall ill-appearing maleEYES: Pupils equal, round, reactive  to light and . No scleral icterus. Extraocular muscles intact.  HEENT: Head atraumatic, normocephalic. Oropharynx and nasopharynx clear.  NECK:  Supple, no jugular venous distention. No thyroid enlargement, no tenderness.  LUNGS: Normal breath sounds bilaterally, no wheezing, rales,rhonchi or crepitation. No use of accessory muscles of respiration.  CARDIOVASCULAR: S1, S2 normal. No murmurs, rubs, or gallops.  ABDOMEN: Soft, nontender, nondistended. Bowel sounds present. No organomegaly or mass.  EXTREMITIES: No pedal edema, cyanosis, or clubbing.  NEUROLOGIC:  unable to follow directions for this exam.  PSYCHIATRIC: The patient is alert and oriented x 3.  SKIN: No obvious rash, lesion, or ulcer.    LABORATORY PANEL:   CBC  Recent Labs Lab 08/05/16 0514  WBC 12.1*  HGB 11.3*  HCT 33.2*  PLT 146*   ------------------------------------------------------------------------------------------------------------------  Chemistries   Recent Labs Lab 08/03/16 1316  08/03/16 1923  08/07/16 0323  NA 128*  --   --   < > 136  K >7.5*  < > 3.8  < > 4.7  CL 98*  --   --   < > 100*  CO2 20*  --   --   < > 28  GLUCOSE 108*  --   --   < > 102*  BUN 54*  --   --   < > 32*  CREATININE 7.90*  --   --   < > 6.75*  CALCIUM 9.1  --   --   < > 8.5*  MG  --   --  1.9  --   --   AST 49*  --   --   --   --  ALT 47  --   --   --   --   ALKPHOS 120  --   --   --   --   BILITOT 0.7  --   --   --   --   < > = values in this interval not displayed. ------------------------------------------------------------------------------------------------------------------  Cardiac Enzymes  Recent Labs Lab 08/05/16 0514  TROPONINI 0.34*   ------------------------------------------------------------------------------------------------------------------  RADIOLOGY:  No results found.  EKG:   Orders placed or performed during the hospital encounter of 08/03/16  . EKG 12-Lead  . EKG 12-Lead  . EKG  12-Lead  . EKG 12-Lead    ASSESSMENT AND PLAN:   #1 acute respiratory failure secondary to cardiac arrest with prolonged resuscitation with the ACLS protocol was more than 30 minutes as per the chart: Status post intubation, extubation. Physical therapy consult, continue oxygen, patient chest x-ray concerning for possible retrocardiac infiltrate: Continue antibiotics.  Acute on chronic diastolic heart failure, severe pulmonary hypertension:  #3 ESRD on hemodialysis, continue to have hyperkalemia: Patient seen by nephrology, vascular. AV fistula evaluated yesterday and patient had no significant stenosis, normal anastomosis. Did not have any dysfunction of AV fistula   4/severe life-threatening hyperkalemia: Improved, Potassium  7.5 on March 5th now 4.7 today. C/o chest pain due to chest compresions.  #5 history of schizophrenia:  He is on prolixinat home restared,continue Lamictal.  #6 essential hypertension: Controlled him on now has bradycardia, clonidine, atenolol, use hydralazine for blood pressure, start Norvasc.  All the records are reviewed and case discussed with Care Management/Social Workerr. Management plans discussed with the patient, family and they are in agreement.  CODE STATUS: full  TOTAL TIME TAKING CARE OF THIS PATIENT: 35 minutes.   POSSIBLE D/C IN 3-4DAYS, DEPENDING ON CLINICAL CONDITION.   Epifanio Lesches M.D on 08/07/2016 at 1:11 PM  Between 7am to 6pm - Pager - [phone]  After 6pm go to [email or web address] - password [email or web address] [facility]  [facility]  Office  [phone]  CC: Primary care physician; No PCP Per Patient   Note: This dictation was prepared with Dragon dictation along with smaller phrase technology. Any transcriptional errors that result from this process are unintentional.

## 2016-08-07 NOTE — Progress Notes (Signed)
PT Cancellation Note  Patient Details Name: Todd Brown MRN: [id number] DOB: Jul 10, 1960   Cancelled Treatment:    Reason Eval/Treat Not Completed: Patient at procedure or test/unavailable (Evaluation re-attempted.  Patient currently off unit for dialysis.  will re-attemp at later time/date as patient available and medically appropriate.)   Ceri Mayer H. Owens Shark, PT, DPT, NCS 08/07/16, 10:42 AM [phone]

## 2016-08-07 NOTE — Progress Notes (Signed)
Pt. Refused to have labs drawn, explained to pt the importance to have labs drawn. He agreed, Dan Maker from lab called for lab draw.

## 2016-08-07 NOTE — Progress Notes (Signed)
Patient transferred to dialysis. Comfortable at this time no complaints of pain.

## 2016-08-07 NOTE — Progress Notes (Signed)
Subjective:  Angiogram yesterday was unremarkable No re-circulation seen  Patient seen during dialysis Tolerating well   HEMODIALYSIS FLOWSHEET:  Blood Flow Rate (mL/min): 400 mL/min Arterial Pressure (mmHg): -150 mmHg Venous Pressure (mmHg): 200 mmHg Transmembrane Pressure (mmHg): 50 mmHg Ultrafiltration Rate (mL/min): 720 mL/min Dialysate Flow Rate (mL/min): 800 ml/min Conductivity: Machine : 14 Conductivity: Machine : 14 Dialysis Fluid Bolus: Normal Saline Bolus Amount (mL): 250 mL Dialysate Change: 2K Intra-Hemodialysis Comments: 2549ml.Marland KitchenMarland KitchenTx completed.     Objective:  Vital signs in last 24 hours:  Temp:  [98.4 F (36.9 C)-98.9 F (37.2 C)] 98.4 F (36.9 C) (03/09 1301) Pulse Rate:  [46-72] 56 (03/09 1346) Resp:  [10-19] 11 (03/09 1301) BP: (142-200)/(72-89) 183/76 (03/09 1346) SpO2:  [86 %-97 %] 91 % (03/09 1346) Weight:  [78.5 kg (173 lb 1 oz)-85.7 kg (189 lb)] 78.5 kg (173 lb 1 oz) (03/09 1301)  Weight change: 4.13 kg (9 lb 1.7 oz) Filed Weights   08/07/16 0357 08/07/16 0915 08/07/16 1301  Weight: 82.5 kg (181 lb 12.8 oz) 81 kg (178 lb 9.2 oz) 78.5 kg (173 lb 1 oz)    Intake/Output:    Intake/Output Summary (Last 24 hours) at 08/07/16 1540 Last data filed at 08/07/16 1346  Gross per 24 hour  Intake                0 ml  Output             2000 ml  Net            -2000 ml     Physical Exam: General: Chronically ill appearing  HEENT Anicteric,  Neck supple  Pulm/lungs Coarse breath sound bilaterally,   CVS/Heart Regular rhythm,   Abdomen:  Soft, nontender, nondistended  Extremities: Trace edema  Neurologic: sedated  Skin: Dry skin  Access: Left upper arm AV fistula       Basic Metabolic Panel:   Recent Labs Lab 08/03/16 1316  08/03/16 1923 08/04/16 0207 08/05/16 0514 08/06/16 0527 08/07/16 0323  NA 128*  --   --  137 140 135 136  K >7.5*  < > 3.8 5.5* 5.4* 5.4* 4.7  CL 98*  --   --  99* 103 97* 100*  CO2 20*  --   --  29 26 26 28    GLUCOSE 108*  --   --  77 81 88 102*  BUN 54*  --   --  28* 24* 22* 32*  CREATININE 7.90*  --   --  5.08* 4.94* 5.07* 6.75*  CALCIUM 9.1  --   --  8.5* 8.5* 9.1 8.5*  MG  --   --  1.9  --   --   --   --   < > = values in this interval not displayed.   CBC:  Recent Labs Lab 08/03/16 1316 08/04/16 0237 08/05/16 0514  WBC 9.2 12.3* 12.1*  NEUTROABS 7.9*  --   --   HGB 12.7* 12.0* 11.3*  HCT 37.1* 34.7* 33.2*  MCV 107.0* 106.6* 104.2*  PLT 282 159 146*      Microbiology:  Recent Results (from the past 720 hour(s))  MRSA PCR Screening     Status: None   Collection Time: 08/03/16  6:09 PM  Result Value Ref Range Status   MRSA by PCR NEGATIVE NEGATIVE Final    Comment:        The GeneXpert MRSA Assay (FDA approved for NASAL specimens only), is one component of a comprehensive  MRSA colonization surveillance program. It is not intended to diagnose MRSA infection nor to guide or monitor treatment for MRSA infections.     Coagulation Studies: No results for input(s): LABPROT, INR in the last 72 hours.  Urinalysis: No results for input(s): COLORURINE, LABSPEC, PHURINE, GLUCOSEU, HGBUR, BILIRUBINUR, KETONESUR, PROTEINUR, UROBILINOGEN, NITRITE, LEUKOCYTESUR in the last 72 hours.  Invalid input(s): APPERANCEUR    Imaging: No results found.   Medications:    . amLODipine  10 mg Oral Daily  . calcium acetate  1,334 mg Oral TID WC  . carbamazepine  500 mg Oral BID  . dextrose  1 ampule Intravenous Once  . docusate sodium  100 mg Oral Daily  . feeding supplement (NEPRO CARB STEADY)  237 mL Oral BID BM  . fluPHENAZine  2.5 mg Oral Daily  . heparin  5,000 Units Subcutaneous Q8H  . hydrALAZINE  25 mg Oral Q8H  . Influenza vac split quadrivalent PF  0.5 mL Intramuscular Tomorrow-1000  . insulin aspart  0-15 Units Subcutaneous TID WC  . insulin aspart  0-5 Units Subcutaneous QHS  . pneumococcal 23 valent vaccine  0.5 mL Intramuscular Tomorrow-1000   acetaminophen,  HYDROcodone-acetaminophen, ipratropium-albuterol, morphine injection, nitroGLYCERIN  Assessment/ Plan:  56 y.o.african American male  With schizophrenia, hypertension, hepatitis C, tobacco abuse, anemia of chronic kidney disease, secondary hyperparathyroidism, end-stage renal disease   [facility] M[facility] [place].   1.  End-stage renal disease 2.  Severe hyperkalemia, life threatening 3.  Acute respiratory failure, extubated 3/6 4.  Anemia of chronic kidney disease. Hgb 11.3  Patient presents with critical hyperkalemia - Confirmed from outpatient unit that he did NOT miss his treatment; He completed his full treatment on Friday Low K diet Follow usual dialysis schedule     LOS: 4 Miyoko Hashimi 3/9/20183:40 PM

## 2016-08-07 NOTE — Progress Notes (Signed)
Upper extremity DVT on dopper study, heparin drip started.  Vascular surgery is already following.  Todd Brown [facility] 08/07/2016, 9:14 PM

## 2016-08-07 NOTE — Progress Notes (Signed)
R arm swollen non pitting. Dr Vianne Bulls Notified. No orders received at this time. May do ultrasound to rule out DVT when patient returns from dialysis.

## 2016-08-07 NOTE — Progress Notes (Signed)
Post hd tx:  Tolerated 3.5hours of treatment with 2liters net removal.Hemastasis achieved,sites dressed with gauze/taped.

## 2016-08-07 NOTE — Progress Notes (Signed)
Hemodialysis completed. 

## 2016-08-07 NOTE — Progress Notes (Signed)
PRE DIALYSIS ASSESSMENT 

## 2016-08-07 NOTE — Progress Notes (Signed)
HD STARTED  

## 2016-08-08 LAB — HEPARIN LEVEL (UNFRACTIONATED)
Heparin Unfractionated: 0.63 IU/mL (ref 0.30–0.70)
Heparin Unfractionated: 0.48 [IU]/mL (ref 0.30–0.70)

## 2016-08-08 LAB — GLUCOSE, CAPILLARY
Glucose-Capillary: 73 mg/dL (ref 65–99)
Glucose-Capillary: 114 mg/dL — ABNORMAL HIGH (ref 65–99)
Glucose-Capillary: 89 mg/dL (ref 65–99)
Glucose-Capillary: 77 mg/dL (ref 65–99)

## 2016-08-08 LAB — CBC
HCT: 31.2 % — ABNORMAL LOW (ref 40.0–52.0)
HEMOGLOBIN: 10.9 g/dL — AB (ref 13.0–18.0)
MCH: 36.8 pg — AB (ref 26.0–34.0)
MCHC: 35 g/dL (ref 32.0–36.0)
MCV: 105.1 fL — AB (ref 80.0–100.0)
Platelets: 162 10*3/uL (ref 150–440)
RBC: 2.97 MIL/uL — AB (ref 4.40–5.90)
RDW: 14.9 % — ABNORMAL HIGH (ref 11.5–14.5)
WBC: 7.1 10*3/uL (ref 3.8–10.6)

## 2016-08-08 LAB — BASIC METABOLIC PANEL
Anion gap: 10 (ref 5–15)
BUN: 25 mg/dL — AB (ref 6–20)
CHLORIDE: 99 mmol/L — AB (ref 101–111)
CO2: 30 mmol/L (ref 22–32)
Calcium: 8.7 mg/dL — ABNORMAL LOW (ref 8.9–10.3)
Creatinine, Ser: 5.44 mg/dL — ABNORMAL HIGH (ref 0.61–1.24)
GFR calc Af Amer: 12 mL/min — ABNORMAL LOW (ref >60)
GFR calc non Af Amer: 11 mL/min — ABNORMAL LOW (ref >60)
GLUCOSE: 81 mg/dL (ref 65–99)
POTASSIUM: 3.9 mmol/L (ref 3.5–5.1)
Sodium: 139 mmol/L (ref 135–145)

## 2016-08-08 LAB — HEPATITIS B SURFACE ANTIGEN: Hepatitis B Surface Ag: NEGATIVE

## 2016-08-08 MED ORDER — HYDRALAZINE HCL 50 MG PO TABS
50.0000 mg | ORAL_TABLET | Freq: Three times a day (TID) | ORAL | Status: DC
Start: 2016-08-08 — End: 2016-08-10
  Administered 2016-08-08 – 2016-08-10 (×6): 50 mg via ORAL
  Filled 2016-08-08 (×6): qty 1

## 2016-08-08 NOTE — Progress Notes (Signed)
[facility] [place] NAME: Todd Brown    MR#:  [id number]  DATE OF BIRTH:  1960-10-20  SUBJECTIVE: Transfer from ICU service yesterday, patient was intubated and admitted to intensive care unit on March 5 after prolonged cardiac arrest secondary to severe hyperkalemia patient had 30 minutes of ACLS, admitted to intensive care unit, extubated, transferred to floor,    During hemodialysis today, awake and alert denies any complaints. On 3 L of oxygen, has bradycardia with heart rate 49.   CHIEF COMPLAINT:   Chief Complaint  Patient presents with  . Weakness    Found to have RUL DVT.  REVIEW OF SYSTEMS:   Review of Systems  Constitutional: Negative for chills and fever.  HENT: Negative for hearing loss.   Eyes: Negative for blurred vision, double vision and photophobia.  Respiratory: Positive for cough. Negative for hemoptysis and shortness of breath.   Cardiovascular: Negative for palpitations, orthopnea and leg swelling.  Gastrointestinal: Negative for abdominal pain, diarrhea and vomiting.  Genitourinary: Negative for dysuria and urgency.  Musculoskeletal: Negative for myalgias and neck pain.  Skin: Negative for rash.  Neurological: Negative for dizziness, focal weakness, seizures, weakness and headaches.  Psychiatric/Behavioral: Negative for memory loss. The patient does not have insomnia.    is very slow to answer but answers appropriately.  DRUG ALLERGIES:   Allergies  Allergen Reactions  . Thorazine [Chlorpromazine] Other (See Comments)    Reaction:  Unknown , pt states it makes him feel real bad    VITALS:  Blood pressure (!) 190/75, pulse (!) 54, temperature 98.3 F (36.8 C), temperature source Oral, resp. rate 18, height 6\' 1"  (1.854 m), weight 79.4 kg (175 lb 0.7 oz), SpO2 99 %.  PHYSICAL EXAMINATION:  GENERAL:  56 y.o.-year-old patient lying in the bed with no acute distress.Overall ill-appearing maleEYES:  Pupils equal, round, reactive to light and . No scleral icterus. Extraocular muscles intact.  HEENT: Head atraumatic, normocephalic. Oropharynx and nasopharynx clear.  NECK:  Supple, no jugular venous distention. No thyroid enlargement, no tenderness.  LUNGS: Normal breath sounds bilaterally, no wheezing, rales,rhonchi or crepitation. No use of accessory muscles of respiration.  CARDIOVASCULAR: S1, S2 normal. No murmurs, rubs, or gallops.  ABDOMEN: Soft, nontender, nondistended. Bowel sounds present. No organomegaly or mass.  EXTREMITIES: No pedal edema, cyanosis, or clubbing.  NEUROLOGIC:  unable to follow directions for this exam.  PSYCHIATRIC: The patient is alert and oriented x 3.  SKIN: No obvious rash, lesion, or ulcer.    LABORATORY PANEL:   CBC  Recent Labs Lab 08/08/16 1345  WBC 7.1  HGB 10.9*  HCT 31.2*  PLT 162   ------------------------------------------------------------------------------------------------------------------  Chemistries   Recent Labs Lab 08/03/16 1316  08/03/16 1923  08/08/16 0606  NA 128*  --   --   < > 139  K >7.5*  < > 3.8  < > 3.9  CL 98*  --   --   < > 99*  CO2 20*  --   --   < > 30  GLUCOSE 108*  --   --   < > 81  BUN 54*  --   --   < > 25*  CREATININE 7.90*  --   --   < > 5.44*  CALCIUM 9.1  --   --   < > 8.7*  MG  --   --  1.9  --   --   AST 49*  --   --   --   --  ALT 47  --   --   --   --   ALKPHOS 120  --   --   --   --   BILITOT 0.7  --   --   --   --   < > = values in this interval not displayed. ------------------------------------------------------------------------------------------------------------------  Cardiac Enzymes  Recent Labs Lab 08/05/16 0514  TROPONINI 0.34*   ------------------------------------------------------------------------------------------------------------------  RADIOLOGY:  US Venous Img Upper Uni Right  Addendum Date: 08/07/2016   ADDENDUM REPORT: 08/07/2016 21:26 ADDENDUM: Acute  findings discussed with and reconfirmed by Dr.Willis on 08/07/2016 at 9:15 pm. Electronically Signed   By: Elon Alas M.D.   On: 08/07/2016 21:26   Result Date: 08/07/2016 CLINICAL DATA:  RIGHT upper extremity swelling for 2 days. History of end-stage renal disease on dialysis. LEFT brachiocephalic fistula placed yesterday. EXAM: RIGHT UPPER EXTREMITY VENOUS DOPPLER ULTRASOUND TECHNIQUE: Gray-scale sonography with graded compression, as well as color Doppler and duplex ultrasound were performed to evaluate the upper extremity deep venous system from the level of the subclavian vein and including the jugular, axillary, basilic, radial, ulnar and upper cephalic vein. Spectral Doppler was utilized to evaluate flow at rest and with distal augmentation maneuvers. COMPARISON:  None. FINDINGS: Contralateral Subclavian Vein: Respiratory phasicity is normal and symmetric with the symptomatic side. No evidence of thrombus. Normal compressibility. Internal Jugular Vein: No evidence of thrombus. Normal compressibility, respiratory phasicity and response to augmentation. Subclavian Vein: No evidence of thrombus. Normal compressibility, respiratory phasicity and response to augmentation. Axillary Vein: No evidence of thrombus. Normal compressibility, respiratory phasicity and response to augmentation. Cephalic Vein: Noncompressible, isoechoic central component without color flow. Basilic Vein: Mildly echogenic debris, noncompressible and no collar flow. Brachial Veins: Likely duplicated with distended debris, noncompressible and no collar flow in 1 of the veins, the second vein demonstrates a nonocclusive thrombus. Radial Veins: No evidence of thrombus. Normal compressibility, respiratory phasicity and response to augmentation. Ulnar Veins: No evidence of thrombus. Normal compressibility, respiratory phasicity and response to augmentation. Other Findings:  None visualized. IMPRESSION: Acute RIGHT occlusive cephalic and  basilic vein thrombosis. Duplicated thrombosed brachial veins, 1 of which is nonocclusive. Electronically Signed: By: Elon Alas M.D. On: 08/07/2016 21:04    EKG:   Orders placed or performed during the hospital encounter of 08/03/16  . EKG 12-Lead  . EKG 12-Lead  . EKG 12-Lead  . EKG 12-Lead    ASSESSMENT AND PLAN:   #1 acute respiratory failure secondary to cardiac arrest with prolonged resuscitation with the ACLS protocol was more than 30 minutes as per the chart: Status post intubation, extubation. Physical therapy consult, continue oxygen, patient chest x-ray concerning for possible retrocardiac infiltrate: Continue antibiotics.  #2 Acute on chronic diastolic heart failure, severe pulmonary hypertension: on HD.  #3 ESRD on hemodialysis, continue to have hyperkalemia: Patient seen by nephrology, vascular. AV fistula evaluated yesterday and patient had no significant stenosis, normal anastomosis. Did not have any dysfunction of AV fistula   # 4 severe life-threatening hyperkalemia: Improved, Potassium  7.5 on March 5th now 4.7 today. C/o chest pain due to chest compresions.  #5 history of schizophrenia:  He is on prolixinat home restared,continue Lamictal.  #6 essential hypertension: Controlled him on now has bradycardia, clonidine, atenolol, use hydralazine for blood pressure, start Norvasc.  #7. RUL DVT   Heparin IV, spoke to Vascualr- may give oral agent when stable, for 3 months.  All the records are reviewed and case discussed with Care Management/Social Workerr. Management  plans discussed with the patient, family and they are in agreement.  CODE STATUS: full  TOTAL TIME TAKING CARE OF THIS PATIENT: 35 minutes.   POSSIBLE D/C IN 3-4DAYS, DEPENDING ON CLINICAL CONDITION.   Vaughan Basta M.D on 08/08/2016 at 2:27 PM  Between 7am to 6pm - Pager - [phone]  After 6pm go to [email or web address] - password [email or web address] [facility]  Office   [phone]  CC: Primary care physician; Nino Glow McLean-Scocuzza, MD   Note: This dictation was prepared with Dragon dictation along with smaller phrase technology. Any transcriptional errors that result from this process are unintentional.

## 2016-08-08 NOTE — Progress Notes (Signed)
CBG 77, HS snack given, pt. At 100%

## 2016-08-08 NOTE — Progress Notes (Signed)
Patient SPO2 drops to 60's when patient comes off oxygen and has complained of worsening chest pain with activity and breathing. This Probation officer understands he developed respiratory distress and cardiac arrested in the ED leading to 30 minutes of ACLS. Patient does have a new diagnosis of a DVT in right arm. This nurse has concern for a PE. Dr. Anselm Jungling notifed of concerns no orders received at this time. Stated he may order Possible further testing tomorrow or Monday. Will continue to monitor patient for further signs of decline.

## 2016-08-08 NOTE — Progress Notes (Signed)
PT Cancellation Note  Patient Details Name: Todd Brown MRN: [id number] DOB: 19-Nov-1960   Cancelled Treatment:    Reason Eval/Treat Not Completed: Patient declined, no reason specified.  Pt firmly refusing PT (therapist encouraged pt to participate but pt reporting he would rather rest).  Will re-attempt PT eval at a later date/time as able.  Leitha Bleak, PT 08/08/16, 10:54 AM [phone]

## 2016-08-08 NOTE — Progress Notes (Signed)
ANTICOAGULATION CONSULT NOTE - Initial Consult  Pharmacy Consult for Heparin  Indication: VTE prophylaxis  Allergies  Allergen Reactions  . Thorazine [Chlorpromazine] Other (See Comments)    Reaction:  Unknown , pt states it makes him feel real bad    Patient Measurements: Height: 6\' 1"  (185.4 cm) Weight: 175 lb 0.7 oz (79.4 kg) IBW/kg (Calculated) : 79.9 Heparin Dosing Weight: 85.7 kg   Vital Signs: Temp: 98.3 F (36.8 C) (03/10 1132) Temp Source: Oral (03/10 1132) BP: 190/75 (03/10 1132) Pulse Rate: 54 (03/10 1132)  Labs:  Recent Labs  08/06/16 0527 08/07/16 0323 08/07/16 2205 08/08/16 0606 08/08/16 1345  HGB  --   --   --   --  10.9*  HCT  --   --   --   --  31.2*  PLT  --   --   --   --  162  APTT  --   --  37*  --   --   LABPROT  --   --  12.5  --   --   INR  --   --  0.93  --   --   HEPARINUNFRC  --   --   --  0.63 0.48  CREATININE 5.07* 6.75*  --  5.44*  --     Estimated Creatinine Clearance: 17.2 mL/min (by C-G formula based on SCr of 5.44 mg/dL (H)).   Medical History: Past Medical History:  Diagnosis Date  . Anemia   . Diabetes mellitus without complication ([place])   . Dialysis patient [facility])    Mon. -Wed.- Fri  . ESRD (end stage renal disease) ([place])   . Hyperlipidemia   . Malignant hypertension   . Renal artery stenosis ([place])   . Schizophrenia ([place])     Medications:  Prescriptions Prior to Admission  Medication Sig Dispense Refill Last Dose  . amLODipine (NORVASC) 10 MG tablet Take 10 mg by mouth daily.   unknown at unknown  . atenolol (TENORMIN) 25 MG tablet Take 25 mg by mouth 2 (two) times daily.    unknown at unknown  . b complex-vitamin c-folic acid (NEPHRO-VITE) 0.8 MG TABS tablet Take 1 tablet by mouth daily.   unknown at unknown  . calcium acetate (PHOSLO) 667 MG capsule Take 2 capsules (1,334 mg total) by mouth 3 (three) times daily with meals. 60 capsule 1 unknown at unknown  . carbamazepine (TEGRETOL XR) 200 MG 12 hr tablet Take  1,000 mg by mouth every morning.   unknown at unknown  . cholecalciferol (VITAMIN D) 1000 UNITS tablet Take 1,000 Units by mouth daily.   unknown at unknown  . citalopram (CELEXA) 20 MG tablet Take 1 tablet (20 mg total) by mouth daily. 30 tablet 1 unknown at unknown  . cloNIDine (CATAPRES) 0.2 MG tablet Take 1 tablet (0.2 mg total) by mouth 3 (three) times daily. 90 tablet 1 unknown at unknown  . ferrous sulfate 325 (65 FE) MG tablet Take 1 tablet (325 mg total) by mouth daily. 30 tablet 3 unknown at unknown  . fluPHENAZine (PROLIXIN) 2.5 MG tablet Take 2.5 mg by mouth daily.    unknown at unknown  . Fluticasone-Salmeterol (ADVAIR DISKUS) 250-50 MCG/DOSE AEPB Inhale 1 puff into the lungs 2 (two) times daily.   unknown at unknown  . hydrALAZINE (APRESOLINE) 100 MG tablet Take 1 tablet (100 mg total) by mouth every 6 (six) hours. (Patient taking differently: Take 100 mg by mouth 2 (two) times daily. ) 120 tablet 1  unknown at unknown  . minoxidil (LONITEN) 2.5 MG tablet Take 2 tablets (5 mg total) by mouth daily. 60 tablet 1 unknown at unknown  . trihexyphenidyl (ARTANE) 5 MG tablet Take 5 mg by mouth 3 (three) times daily with meals.   unknown at unknown  . acetaminophen (TYLENOL) 500 MG tablet Take 500 mg by mouth every 4 (four) hours as needed for mild pain. Take 1 tablet every 4-6 hours as needed for pain.   prn at prn  . albuterol (PROAIR HFA) 108 (90 Base) MCG/ACT inhaler Inhale 1-2 puffs into the lungs every 6 (six) hours as needed for wheezing or shortness of breath (cough).   prn at prn  . amoxicillin-clavulanate (AUGMENTIN) 500-125 MG tablet Take 1 tablet (500 mg total) by mouth 3 (three) times daily. 15 tablet 0   . baclofen (LIORESAL) 10 MG tablet Take 1 tablet (10 mg total) by mouth 2 (two) times daily as needed for muscle spasms. 30 each 0 05/25/2016 at Unknown time  . benzonatate (TESSALON) 200 MG capsule Take 1 capsule (200 mg total) by mouth 2 (two) times daily as needed for cough. 20  capsule 0   . calcium acetate (PHOSLO) 667 MG capsule Take 1,334 mg by mouth 3 (three) times daily with meals.    unknown at unknown  . carbamazepine (TEGRETOL) 200 MG tablet Take 2.5 tablets (500 mg total) by mouth 2 (two) times daily. Taken 2 1/2 tabs (500mg  ) twice a day (Patient not taking: Reported on 04/14/2016) 150 tablet 0 Not Taking  . cyclobenzaprine (FLEXERIL) 5 MG tablet Take 5 mg by mouth at bedtime as needed for muscle spasms.   prn at prn  . fluticasone furoate-vilanterol (BREO ELLIPTA) 100-25 MCG/INH AEPB Inhale 1 puff into the lungs daily.   unknown at unknown  . traMADol (ULTRAM) 50 MG tablet Take 1 tablet (50 mg total) by mouth every 6 (six) hours as needed. 30 tablet 0 unknown at Unknown time    Assessment: Pharmacy consulted to dose heparin in this 56 year old male with VTE.   CrCl = 13.7 ml/min.    Pt received heparin 5000 units SQ on 3/9 @ 0530.   Goal of Therapy:  Heparin level 0.3-0.7 units/ml Monitor platelets by anticoagulation protocol: Yes   Plan:  Give 5000 units bolus x 1 Start heparin infusion at 1500 units/hr Check anti-Xa level in 8 hours and daily while on heparin Continue to monitor H&H and platelets  3/10 AM heparin level 0.63. Recheck in 8 hours to confirm. 3/10 Heparin level resulted @ 0.48. Continue current rate. Recheck heparin level in am   Chiron Campione D 08/08/2016,2:33 PM

## 2016-08-08 NOTE — Progress Notes (Signed)
ANTICOAGULATION CONSULT NOTE - Initial Consult  Pharmacy Consult for Heparin  Indication: VTE prophylaxis  Allergies  Allergen Reactions  . Thorazine [Chlorpromazine] Other (See Comments)    Reaction:  Unknown , pt states it makes him feel real bad    Patient Measurements: Height: 6\' 1"  (185.4 cm) Weight: 175 lb 0.7 oz (79.4 kg) IBW/kg (Calculated) : 79.9 Heparin Dosing Weight: 85.7 kg   Vital Signs: Temp: 98.5 F (36.9 C) (03/10 0515) Temp Source: Oral (03/10 0515) BP: 179/83 (03/10 0515) Pulse Rate: 57 (03/10 0515)  Labs:  Recent Labs  08/06/16 0527 08/07/16 0323 08/07/16 2205 08/08/16 0606  APTT  --   --  37*  --   LABPROT  --   --  12.5  --   INR  --   --  0.93  --   HEPARINUNFRC  --   --   --  0.63  CREATININE 5.07* 6.75*  --  5.44*    Estimated Creatinine Clearance: 17.2 mL/min (by C-G formula based on SCr of 5.44 mg/dL (H)).   Medical History: Past Medical History:  Diagnosis Date  . Anemia   . Diabetes mellitus without complication ([place])   . Dialysis patient [facility])    Mon. -Wed.- Fri  . ESRD (end stage renal disease) ([place])   . Hyperlipidemia   . Malignant hypertension   . Renal artery stenosis ([place])   . Schizophrenia ([place])     Medications:  Prescriptions Prior to Admission  Medication Sig Dispense Refill Last Dose  . amLODipine (NORVASC) 10 MG tablet Take 10 mg by mouth daily.   unknown at unknown  . atenolol (TENORMIN) 25 MG tablet Take 25 mg by mouth 2 (two) times daily.    unknown at unknown  . b complex-vitamin c-folic acid (NEPHRO-VITE) 0.8 MG TABS tablet Take 1 tablet by mouth daily.   unknown at unknown  . calcium acetate (PHOSLO) 667 MG capsule Take 2 capsules (1,334 mg total) by mouth 3 (three) times daily with meals. 60 capsule 1 unknown at unknown  . carbamazepine (TEGRETOL XR) 200 MG 12 hr tablet Take 1,000 mg by mouth every morning.   unknown at unknown  . cholecalciferol (VITAMIN D) 1000 UNITS tablet Take 1,000 Units by mouth daily.    unknown at unknown  . citalopram (CELEXA) 20 MG tablet Take 1 tablet (20 mg total) by mouth daily. 30 tablet 1 unknown at unknown  . cloNIDine (CATAPRES) 0.2 MG tablet Take 1 tablet (0.2 mg total) by mouth 3 (three) times daily. 90 tablet 1 unknown at unknown  . ferrous sulfate 325 (65 FE) MG tablet Take 1 tablet (325 mg total) by mouth daily. 30 tablet 3 unknown at unknown  . fluPHENAZine (PROLIXIN) 2.5 MG tablet Take 2.5 mg by mouth daily.    unknown at unknown  . Fluticasone-Salmeterol (ADVAIR DISKUS) 250-50 MCG/DOSE AEPB Inhale 1 puff into the lungs 2 (two) times daily.   unknown at unknown  . hydrALAZINE (APRESOLINE) 100 MG tablet Take 1 tablet (100 mg total) by mouth every 6 (six) hours. (Patient taking differently: Take 100 mg by mouth 2 (two) times daily. ) 120 tablet 1 unknown at unknown  . minoxidil (LONITEN) 2.5 MG tablet Take 2 tablets (5 mg total) by mouth daily. 60 tablet 1 unknown at unknown  . trihexyphenidyl (ARTANE) 5 MG tablet Take 5 mg by mouth 3 (three) times daily with meals.   unknown at unknown  . acetaminophen (TYLENOL) 500 MG tablet Take 500 mg by mouth  every 4 (four) hours as needed for mild pain. Take 1 tablet every 4-6 hours as needed for pain.   prn at prn  . albuterol (PROAIR HFA) 108 (90 Base) MCG/ACT inhaler Inhale 1-2 puffs into the lungs every 6 (six) hours as needed for wheezing or shortness of breath (cough).   prn at prn  . amoxicillin-clavulanate (AUGMENTIN) 500-125 MG tablet Take 1 tablet (500 mg total) by mouth 3 (three) times daily. 15 tablet 0   . baclofen (LIORESAL) 10 MG tablet Take 1 tablet (10 mg total) by mouth 2 (two) times daily as needed for muscle spasms. 30 each 0 05/25/2016 at Unknown time  . benzonatate (TESSALON) 200 MG capsule Take 1 capsule (200 mg total) by mouth 2 (two) times daily as needed for cough. 20 capsule 0   . calcium acetate (PHOSLO) 667 MG capsule Take 1,334 mg by mouth 3 (three) times daily with meals.    unknown at unknown  .  carbamazepine (TEGRETOL) 200 MG tablet Take 2.5 tablets (500 mg total) by mouth 2 (two) times daily. Taken 2 1/2 tabs (500mg  ) twice a day (Patient not taking: Reported on 04/14/2016) 150 tablet 0 Not Taking  . cyclobenzaprine (FLEXERIL) 5 MG tablet Take 5 mg by mouth at bedtime as needed for muscle spasms.   prn at prn  . fluticasone furoate-vilanterol (BREO ELLIPTA) 100-25 MCG/INH AEPB Inhale 1 puff into the lungs daily.   unknown at unknown  . traMADol (ULTRAM) 50 MG tablet Take 1 tablet (50 mg total) by mouth every 6 (six) hours as needed. 30 tablet 0 unknown at Unknown time    Assessment: Pharmacy consulted to dose heparin in this 56 year old male with VTE.   CrCl = 13.7 ml/min.    Pt received heparin 5000 units SQ on 3/9 @ 0530.   Goal of Therapy:  Heparin level 0.3-0.7 units/ml Monitor platelets by anticoagulation protocol: Yes   Plan:  Give 5000 units bolus x 1 Start heparin infusion at 1500 units/hr Check anti-Xa level in 8 hours and daily while on heparin Continue to monitor H&H and platelets  3/10 AM heparin level 0.63. Recheck in 8 hours to confirm.  Poet Hineman S 08/08/2016,6:46 AM

## 2016-08-08 NOTE — Progress Notes (Signed)
Pt. Slept well throughout the night requesting pain medication with effective results.  Pink sleeve placed on right arm for protection

## 2016-08-09 LAB — HEPARIN LEVEL (UNFRACTIONATED): HEPARIN UNFRACTIONATED: 0.46 [IU]/mL (ref 0.30–0.70)

## 2016-08-09 LAB — GLUCOSE, CAPILLARY
GLUCOSE-CAPILLARY: 106 mg/dL — AB (ref 65–99)
GLUCOSE-CAPILLARY: 114 mg/dL — AB (ref 65–99)
Glucose-Capillary: 68 mg/dL (ref 65–99)
Glucose-Capillary: 115 mg/dL — ABNORMAL HIGH (ref 65–99)

## 2016-08-09 LAB — PROTIME-INR
INR: 0.97
PROTHROMBIN TIME: 12.9 s (ref 11.4–15.2)

## 2016-08-09 MED ORDER — WARFARIN SODIUM 7.5 MG PO TABS
7.5000 mg | ORAL_TABLET | Freq: Once | ORAL | Status: AC
  Administered 2016-08-09: 7.5 mg via ORAL
  Filled 2016-08-09: qty 1

## 2016-08-09 MED ORDER — WARFARIN - PHARMACIST DOSING INPATIENT
Freq: Every day | Status: DC
  Administered 2016-08-09 – 2016-08-16 (×4)

## 2016-08-09 MED ORDER — EPOETIN ALFA 10000 UNIT/ML IJ SOLN
4000.0000 [IU] | INTRAMUSCULAR | Status: DC
  Administered 2016-08-10 – 2016-08-14 (×3): 4000 [IU] via INTRAVENOUS
  Filled 2016-08-09: qty 0.4

## 2016-08-09 MED ORDER — HEPARIN (PORCINE) IN NACL 100-0.45 UNIT/ML-% IJ SOLN
1500.0000 [IU]/h | INTRAMUSCULAR | Status: DC
  Administered 2016-08-09 – 2016-08-12 (×4): 1500 [IU]/h via INTRAVENOUS
  Administered 2016-08-13 – 2016-08-17 (×7): 1700 [IU]/h via INTRAVENOUS
  Filled 2016-08-09 (×12): qty 250

## 2016-08-09 NOTE — Progress Notes (Signed)
ANTICOAGULATION CONSULT NOTE - Initial Consult  Pharmacy Consult for warfarin Indication: DVT  Allergies  Allergen Reactions  . Thorazine [Chlorpromazine] Other (See Comments)    Reaction:  Unknown , pt states it makes him feel real bad    Patient Measurements: Height: 6\' 1"  (185.4 cm) Weight: 174 lb 13.2 oz (79.3 kg) IBW/kg (Calculated) : 79.9  Vital Signs: Temp: 98 F (36.7 C) (03/11 1106) Temp Source: Oral (03/11 1106) Pulse Rate: 56 (03/11 1106)  Labs:  Recent Labs  08/07/16 0323 08/07/16 2205 08/08/16 0606 08/08/16 1345 08/09/16 0512  HGB  --   --   --  10.9*  --   HCT  --   --   --  31.2*  --   PLT  --   --   --  162  --   APTT  --  37*  --   --   --   LABPROT  --  12.5  --   --   --   INR  --  0.93  --   --   --   HEPARINUNFRC  --   --  0.63 0.48 0.46  CREATININE 6.75*  --  5.44*  --   --     Estimated Creatinine Clearance: 17.2 mL/min (by C-G formula based on SCr of 5.44 mg/dL (H)).   Medical History: Past Medical History:  Diagnosis Date  . Anemia   . Diabetes mellitus without complication ([place])   . Dialysis patient [facility])    Mon. -Wed.- Fri  . ESRD (end stage renal disease) ([place])   . Hyperlipidemia   . Malignant hypertension   . Renal artery stenosis ([place])   . Schizophrenia ([place])    Medications:  Prescriptions Prior to Admission  Medication Sig Dispense Refill Last Dose  . amLODipine (NORVASC) 10 MG tablet Take 10 mg by mouth daily.   unknown at unknown  . atenolol (TENORMIN) 25 MG tablet Take 25 mg by mouth 2 (two) times daily.    unknown at unknown  . b complex-vitamin c-folic acid (NEPHRO-VITE) 0.8 MG TABS tablet Take 1 tablet by mouth daily.   unknown at unknown  . calcium acetate (PHOSLO) 667 MG capsule Take 2 capsules (1,334 mg total) by mouth 3 (three) times daily with meals. 60 capsule 1 unknown at unknown  . carbamazepine (TEGRETOL XR) 200 MG 12 hr tablet Take 1,000 mg by mouth every morning.   unknown at unknown  . cholecalciferol  (VITAMIN D) 1000 UNITS tablet Take 1,000 Units by mouth daily.   unknown at unknown  . citalopram (CELEXA) 20 MG tablet Take 1 tablet (20 mg total) by mouth daily. 30 tablet 1 unknown at unknown  . cloNIDine (CATAPRES) 0.2 MG tablet Take 1 tablet (0.2 mg total) by mouth 3 (three) times daily. 90 tablet 1 unknown at unknown  . ferrous sulfate 325 (65 FE) MG tablet Take 1 tablet (325 mg total) by mouth daily. 30 tablet 3 unknown at unknown  . fluPHENAZine (PROLIXIN) 2.5 MG tablet Take 2.5 mg by mouth daily.    unknown at unknown  . Fluticasone-Salmeterol (ADVAIR DISKUS) 250-50 MCG/DOSE AEPB Inhale 1 puff into the lungs 2 (two) times daily.   unknown at unknown  . hydrALAZINE (APRESOLINE) 100 MG tablet Take 1 tablet (100 mg total) by mouth every 6 (six) hours. (Patient taking differently: Take 100 mg by mouth 2 (two) times daily. ) 120 tablet 1 unknown at unknown  . minoxidil (LONITEN) 2.5 MG tablet Take 2 tablets (  5 mg total) by mouth daily. 60 tablet 1 unknown at unknown  . trihexyphenidyl (ARTANE) 5 MG tablet Take 5 mg by mouth 3 (three) times daily with meals.   unknown at unknown  . acetaminophen (TYLENOL) 500 MG tablet Take 500 mg by mouth every 4 (four) hours as needed for mild pain. Take 1 tablet every 4-6 hours as needed for pain.   prn at prn  . albuterol (PROAIR HFA) 108 (90 Base) MCG/ACT inhaler Inhale 1-2 puffs into the lungs every 6 (six) hours as needed for wheezing or shortness of breath (cough).   prn at prn  . amoxicillin-clavulanate (AUGMENTIN) 500-125 MG tablet Take 1 tablet (500 mg total) by mouth 3 (three) times daily. 15 tablet 0   . baclofen (LIORESAL) 10 MG tablet Take 1 tablet (10 mg total) by mouth 2 (two) times daily as needed for muscle spasms. 30 each 0 05/25/2016 at Unknown time  . benzonatate (TESSALON) 200 MG capsule Take 1 capsule (200 mg total) by mouth 2 (two) times daily as needed for cough. 20 capsule 0   . calcium acetate (PHOSLO) 667 MG capsule Take 1,334 mg by  mouth 3 (three) times daily with meals.    unknown at unknown  . carbamazepine (TEGRETOL) 200 MG tablet Take 2.5 tablets (500 mg total) by mouth 2 (two) times daily. Taken 2 1/2 tabs (500mg  ) twice a day (Patient not taking: Reported on 04/14/2016) 150 tablet 0 Not Taking  . cyclobenzaprine (FLEXERIL) 5 MG tablet Take 5 mg by mouth at bedtime as needed for muscle spasms.   prn at prn  . fluticasone furoate-vilanterol (BREO ELLIPTA) 100-25 MCG/INH AEPB Inhale 1 puff into the lungs daily.   unknown at unknown  . traMADol (ULTRAM) 50 MG tablet Take 1 tablet (50 mg total) by mouth every 6 (six) hours as needed. 30 tablet 0 unknown at Unknown time   Scheduled:  . amLODipine  10 mg Oral Daily  . calcium acetate  1,334 mg Oral TID WC  . carbamazepine  500 mg Oral BID  . docusate sodium  100 mg Oral Daily  . feeding supplement (NEPRO CARB STEADY)  237 mL Oral BID BM  . fluPHENAZine  2.5 mg Oral Daily  . hydrALAZINE  50 mg Oral Q8H  . Influenza vac split quadrivalent PF  0.5 mL Intramuscular Tomorrow-1000  . insulin aspart  0-15 Units Subcutaneous TID WC  . insulin aspart  0-5 Units Subcutaneous QHS  . pneumococcal 23 valent vaccine  0.5 mL Intramuscular Tomorrow-1000  . warfarin  7.5 mg Oral ONCE-1800  . Warfarin - Pharmacist Dosing Inpatient   Does not apply q1800   Infusions:  . heparin      Assessment: Pharmacy consulted to dose and monitor warfarin in this 56 year old male for treatment of a right upper extremity DVT. Patient also has ESRD on HD and has been anticoagulated with a heparin drip.   Patient not eligible for DOAC due to contraindicated drug interaction with carbamazepine. Patient is being started on warfarin today with heparin bridge.   Goal of Therapy:  INR 2-3 Monitor platelets by anticoagulation protocol: Yes   Plan:  Continue heparin drip at current infusion rate of 1500 units/hr. Next HL and CBC ordered with AM labs tomorrow.  Will order warfarin 7.5 mg PO to be  given at 1800 this evening. Will start with 7.5 mg given DDI with carbamazepine as carbamazepine induces metabolism of warfarin. Will check INR with AM labs tomorrow.  Lenis Noon, PharmD, BCPS Clinical Pharmacist 08/09/2016,11:16 AM

## 2016-08-09 NOTE — Progress Notes (Addendum)
Right upper extremity continues to have 3+ edema, hand is also edematous. Radial pulse continues to be 1+ to RUE.

## 2016-08-09 NOTE — Progress Notes (Signed)
Subjective:   doing fair No acute c/o reported   Objective:  Vital signs in last 24 hours:  Temp:  [98 F (36.7 C)-98.7 F (37.1 C)] 98 F (36.7 C) (03/11 1106) Pulse Rate:  [55-56] 56 (03/11 1106) Resp:  [14-20] 20 (03/11 1106) BP: (176)/(87) 176/87 (03/10 1959) SpO2:  [94 %-100 %] 100 % (03/11 1106) Weight:  [79.3 kg (174 lb 13.2 oz)] 79.3 kg (174 lb 13.2 oz) (03/11 0500)  Weight change: -1.7 kg (-3 lb 12 oz) Filed Weights   08/07/16 1301 08/08/16 0500 08/09/16 0500  Weight: 78.5 kg (173 lb 1 oz) 79.4 kg (175 lb 0.7 oz) 79.3 kg (174 lb 13.2 oz)    Intake/Output:    Intake/Output Summary (Last 24 hours) at 08/09/16 1252 Last data filed at 08/09/16 1011  Gross per 24 hour  Intake            814.5 ml  Output              700 ml  Net            114.5 ml     Physical Exam: General: Chronically ill appearing  HEENT Anicteric,  Neck supple  Pulm/lungs Coarse breath sound bilaterally,   CVS/Heart Regular rhythm,   Abdomen:  Soft, nontender, nondistended  Extremities: Trace edema  Neurologic: sedated  Skin: Dry skin  Access: Left upper arm AV fistula       Basic Metabolic Panel:   Recent Labs Lab 08/03/16 1923 08/04/16 0207 08/05/16 0514 08/06/16 0527 08/07/16 0323 08/08/16 0606  NA  --  137 140 135 136 139  K 3.8 5.5* 5.4* 5.4* 4.7 3.9  CL  --  99* 103 97* 100* 99*  CO2  --  29 26 26 28 30   GLUCOSE  --  77 81 88 102* 81  BUN  --  28* 24* 22* 32* 25*  CREATININE  --  5.08* 4.94* 5.07* 6.75* 5.44*  CALCIUM  --  8.5* 8.5* 9.1 8.5* 8.7*  MG 1.9  --   --   --   --   --      CBC:  Recent Labs Lab 08/03/16 1316 08/04/16 0237 08/05/16 0514 08/08/16 1345  WBC 9.2 12.3* 12.1* 7.1  NEUTROABS 7.9*  --   --   --   HGB 12.7* 12.0* 11.3* 10.9*  HCT 37.1* 34.7* 33.2* 31.2*  MCV 107.0* 106.6* 104.2* 105.1*  PLT 282 159 146* 162      Microbiology:  Recent Results (from the past 720 hour(s))  MRSA PCR Screening     Status: None   Collection Time:  08/03/16  6:09 PM  Result Value Ref Range Status   MRSA by PCR NEGATIVE NEGATIVE Final    Comment:        The GeneXpert MRSA Assay (FDA approved for NASAL specimens only), is one component of a comprehensive MRSA colonization surveillance program. It is not intended to diagnose MRSA infection nor to guide or monitor treatment for MRSA infections.     Coagulation Studies:  Recent Labs  08/07/16 2205  LABPROT 12.5  INR 0.93    Urinalysis: No results for input(s): COLORURINE, LABSPEC, PHURINE, GLUCOSEU, HGBUR, BILIRUBINUR, KETONESUR, PROTEINUR, UROBILINOGEN, NITRITE, LEUKOCYTESUR in the last 72 hours.  Invalid input(s): APPERANCEUR    Imaging: US Venous Img Upper Uni Right  Addendum Date: 08/07/2016   ADDENDUM REPORT: 08/07/2016 21:26 ADDENDUM: Acute findings discussed with and reconfirmed by Dr.Willis on 08/07/2016 at 9:15 pm. Electronically Signed  By: Elon Alas M.D.   On: 08/07/2016 21:26   Result Date: 08/07/2016 CLINICAL DATA:  RIGHT upper extremity swelling for 2 days. History of end-stage renal disease on dialysis. LEFT brachiocephalic fistula placed yesterday. EXAM: RIGHT UPPER EXTREMITY VENOUS DOPPLER ULTRASOUND TECHNIQUE: Gray-scale sonography with graded compression, as well as color Doppler and duplex ultrasound were performed to evaluate the upper extremity deep venous system from the level of the subclavian vein and including the jugular, axillary, basilic, radial, ulnar and upper cephalic vein. Spectral Doppler was utilized to evaluate flow at rest and with distal augmentation maneuvers. COMPARISON:  None. FINDINGS: Contralateral Subclavian Vein: Respiratory phasicity is normal and symmetric with the symptomatic side. No evidence of thrombus. Normal compressibility. Internal Jugular Vein: No evidence of thrombus. Normal compressibility, respiratory phasicity and response to augmentation. Subclavian Vein: No evidence of thrombus. Normal compressibility, respiratory  phasicity and response to augmentation. Axillary Vein: No evidence of thrombus. Normal compressibility, respiratory phasicity and response to augmentation. Cephalic Vein: Noncompressible, isoechoic central component without color flow. Basilic Vein: Mildly echogenic debris, noncompressible and no collar flow. Brachial Veins: Likely duplicated with distended debris, noncompressible and no collar flow in 1 of the veins, the second vein demonstrates a nonocclusive thrombus. Radial Veins: No evidence of thrombus. Normal compressibility, respiratory phasicity and response to augmentation. Ulnar Veins: No evidence of thrombus. Normal compressibility, respiratory phasicity and response to augmentation. Other Findings:  None visualized. IMPRESSION: Acute RIGHT occlusive cephalic and basilic vein thrombosis. Duplicated thrombosed brachial veins, 1 of which is nonocclusive. Electronically Signed: By: Elon Alas M.D. On: 08/07/2016 21:04     Medications:   . heparin 1,500 Units/hr (08/09/16 1121)   . amLODipine  10 mg Oral Daily  . calcium acetate  1,334 mg Oral TID WC  . carbamazepine  500 mg Oral BID  . docusate sodium  100 mg Oral Daily  . feeding supplement (NEPRO CARB STEADY)  237 mL Oral BID BM  . fluPHENAZine  2.5 mg Oral Daily  . hydrALAZINE  50 mg Oral Q8H  . Influenza vac split quadrivalent PF  0.5 mL Intramuscular Tomorrow-1000  . insulin aspart  0-15 Units Subcutaneous TID WC  . insulin aspart  0-5 Units Subcutaneous QHS  . pneumococcal 23 valent vaccine  0.5 mL Intramuscular Tomorrow-1000  . warfarin  7.5 mg Oral ONCE-1800  . Warfarin - Pharmacist Dosing Inpatient   Does not apply q1800   acetaminophen, HYDROcodone-acetaminophen, ipratropium-albuterol, morphine injection, nitroGLYCERIN  Assessment/ Plan:  56 y.o.african American male  With schizophrenia, hypertension, hepatitis C, tobacco abuse, anemia of chronic kidney disease, secondary hyperparathyroidism, end-stage renal  disease   [facility] MWF [place].   1.  End-stage renal disease 2.  Severe hyperkalemia, life threatening 3.  Acute respiratory failure, extubated 3/6 4.  Anemia of chronic kidney disease. Hgb 10.9  Patient presented with critical hyperkalemia - Confirmed from outpatient unit that he did NOT miss his treatment; He completed his full treatment on Friday. May need 1 K bath outpatient. Angiogram in patient this time did not reveal any stenosis - No evidence of recirculation Low K diet Follow usual dialysis schedule     LOS: 6 Arend Bahl 3/11/201812:52 PM

## 2016-08-09 NOTE — Progress Notes (Signed)
[facility] [place] NAME: Todd Brown    MR#:  [id number]  DATE OF BIRTH:  [birth year]  SUBJECTIVE: Transfer from ICU service yesterday, patient was intubated and admitted to intensive care unit on March 5 after prolonged cardiac arrest secondary to severe hyperkalemia patient had 30 minutes of ACLS, admitted to intensive care unit, extubated, transferred to floor,    During hemodialysis today, awake and alert denies any complaints. On 3 L of oxygen, has bradycardia with heart rate 49.   CHIEF COMPLAINT:   Chief Complaint  Patient presents with  . Weakness    Found to have RUL DVT.   On heparin IV drip, he still complains of intermittent chest pain and complains of shortness of breath.  REVIEW OF SYSTEMS:   Review of Systems  Constitutional: Negative for chills and fever.  HENT: Negative for hearing loss.   Eyes: Negative for blurred vision, double vision and photophobia.  Respiratory: Positive for cough. Negative for hemoptysis and shortness of breath.   Cardiovascular: Negative for palpitations, orthopnea and leg swelling.  Gastrointestinal: Negative for abdominal pain, diarrhea and vomiting.  Genitourinary: Negative for dysuria and urgency.  Musculoskeletal: Negative for myalgias and neck pain.  Skin: Negative for rash.  Neurological: Negative for dizziness, focal weakness, seizures, weakness and headaches.  Psychiatric/Behavioral: Negative for memory loss. The patient does not have insomnia.    is very slow to answer but answers appropriately.  DRUG ALLERGIES:   Allergies  Allergen Reactions  . Thorazine [Chlorpromazine] Other (See Comments)    Reaction:  Unknown , pt states it makes him feel real bad    VITALS:  Blood pressure (!) 173/99, pulse 60, temperature 98 F (36.7 C), temperature source Oral, resp. rate 20, height 6\' 1"  (1.854 m), weight 79.3 kg (174 lb 13.2 oz), SpO2 100 %.  PHYSICAL EXAMINATION:   GENERAL:  56 y.o.-year-old patient lying in the bed with no acute distress.Overall ill-appearing maleEYES: Pupils equal, round, reactive to light and . No scleral icterus. Extraocular muscles intact.  HEENT: Head atraumatic, normocephalic. Oropharynx and nasopharynx clear.  NECK:  Supple, no jugular venous distention. No thyroid enlargement, no tenderness.  LUNGS: Normal breath sounds bilaterally, no wheezing, rales,rhonchi or crepitation. No use of accessory muscles of respiration.  CARDIOVASCULAR: S1, S2 normal. No murmurs, rubs, or gallops.  ABDOMEN: Soft, nontender, nondistended. Bowel sounds present. No organomegaly or mass.  EXTREMITIES: No pedal edema, cyanosis, or clubbing.  NEUROLOGIC:  unable to follow directions for this exam.  PSYCHIATRIC: The patient is alert and oriented x 3.  SKIN: No obvious rash, lesion, or ulcer.    LABORATORY PANEL:   CBC  Recent Labs Lab 08/08/16 1345  WBC 7.1  HGB 10.9*  HCT 31.2*  PLT 162   ------------------------------------------------------------------------------------------------------------------  Chemistries   Recent Labs Lab 08/03/16 1316  08/03/16 1923  08/08/16 0606  NA 128*  --   --   < > 139  K >7.5*  < > 3.8  < > 3.9  CL 98*  --   --   < > 99*  CO2 20*  --   --   < > 30  GLUCOSE 108*  --   --   < > 81  BUN 54*  --   --   < > 25*  CREATININE 7.90*  --   --   < > 5.44*  CALCIUM 9.1  --   --   < > 8.7*  MG  --   --  1.9  --   --   AST 49*  --   --   --   --   ALT 47  --   --   --   --   ALKPHOS 120  --   --   --   --   BILITOT 0.7  --   --   --   --   < > = values in this interval not displayed. ------------------------------------------------------------------------------------------------------------------  Cardiac Enzymes  Recent Labs Lab 08/05/16 0514  TROPONINI 0.34*   ------------------------------------------------------------------------------------------------------------------  RADIOLOGY:  US  Venous Img Upper Uni Right  Addendum Date: 08/07/2016   ADDENDUM REPORT: 08/07/2016 21:26 ADDENDUM: Acute findings discussed with and reconfirmed by Dr.Willis on 08/07/2016 at 9:15 pm. Electronically Signed   By: Elon Alas M.D.   On: 08/07/2016 21:26   Result Date: 08/07/2016 CLINICAL DATA:  RIGHT upper extremity swelling for 2 days. History of end-stage renal disease on dialysis. LEFT brachiocephalic fistula placed yesterday. EXAM: RIGHT UPPER EXTREMITY VENOUS DOPPLER ULTRASOUND TECHNIQUE: Gray-scale sonography with graded compression, as well as color Doppler and duplex ultrasound were performed to evaluate the upper extremity deep venous system from the level of the subclavian vein and including the jugular, axillary, basilic, radial, ulnar and upper cephalic vein. Spectral Doppler was utilized to evaluate flow at rest and with distal augmentation maneuvers. COMPARISON:  None. FINDINGS: Contralateral Subclavian Vein: Respiratory phasicity is normal and symmetric with the symptomatic side. No evidence of thrombus. Normal compressibility. Internal Jugular Vein: No evidence of thrombus. Normal compressibility, respiratory phasicity and response to augmentation. Subclavian Vein: No evidence of thrombus. Normal compressibility, respiratory phasicity and response to augmentation. Axillary Vein: No evidence of thrombus. Normal compressibility, respiratory phasicity and response to augmentation. Cephalic Vein: Noncompressible, isoechoic central component without color flow. Basilic Vein: Mildly echogenic debris, noncompressible and no collar flow. Brachial Veins: Likely duplicated with distended debris, noncompressible and no collar flow in 1 of the veins, the second vein demonstrates a nonocclusive thrombus. Radial Veins: No evidence of thrombus. Normal compressibility, respiratory phasicity and response to augmentation. Ulnar Veins: No evidence of thrombus. Normal compressibility, respiratory phasicity and  response to augmentation. Other Findings:  None visualized. IMPRESSION: Acute RIGHT occlusive cephalic and basilic vein thrombosis. Duplicated thrombosed brachial veins, 1 of which is nonocclusive. Electronically Signed: By: Elon Alas M.D. On: 08/07/2016 21:04    EKG:   Orders placed or performed during the hospital encounter of 08/03/16  . EKG 12-Lead  . EKG 12-Lead  . EKG 12-Lead  . EKG 12-Lead    ASSESSMENT AND PLAN:   #1 acute respiratory failure secondary to cardiac arrest with prolonged resuscitation with the ACLS protocol was more than 30 minutes as per the chart: Status post intubation, extubation. Physical therapy consult, continue oxygen, patient chest x-ray concerning for possible retrocardiac infiltrate: Continue antibiotics.  #2 Acute on chronic diastolic heart failure, severe pulmonary hypertension: on HD.  #3 ESRD on hemodialysis, continue to have hyperkalemia: Patient seen by nephrology, vascular. AV fistula evaluated yesterday and patient had no significant stenosis, normal anastomosis. Did not have any dysfunction of AV fistula   # 4 severe life-threatening hyperkalemia: Improved, Potassium  7.5 on March 5th now 4.7 today. C/o chest pain due to chest compresions.  #5 history of schizophrenia:  He is on prolixinat home restared,continue Lamictal.  #6 essential hypertension: Controlled him on now has bradycardia, clonidine, atenolol, use hydralazine for blood pressure, start Norvasc.  #7. RUL DVT   Heparin IV, spoke to Vascualr-  may give oral agent when stable, for 3 months.   Due to kidney issues and being on carbamazepine, he is not a candidate for new or oral anticoagulant agents, so started on warfarin and he may need to have Lovenox for bridging on discharge.  All the records are reviewed and case discussed with Care Management/Social Workerr. Management plans discussed with the patient, family and they are in agreement.  CODE STATUS: full  TOTAL TIME  TAKING CARE OF THIS PATIENT: 35 minutes.   POSSIBLE D/C IN 3-4DAYS, DEPENDING ON CLINICAL CONDITION.   Vaughan Basta M.D on 08/09/2016 at 1:56 PM  Between 7am to 6pm - Pager - [phone]  After 6pm go to [email or web address] - password [email or web address] [facility]  Office  [phone]  CC: Primary care physician; Nino Glow McLean-Scocuzza, MD   Note: This dictation was prepared with Dragon dictation along with smaller phrase technology. Any transcriptional errors that result from this process are unintentional.

## 2016-08-09 NOTE — Progress Notes (Signed)
ANTICOAGULATION CONSULT NOTE - Initial Consult  Pharmacy Consult for Heparin  Indication: VTE prophylaxis  Allergies  Allergen Reactions  . Thorazine [Chlorpromazine] Other (See Comments)    Reaction:  Unknown , pt states it makes him feel real bad    Patient Measurements: Height: 6\' 1"  (185.4 cm) Weight: 174 lb 13.2 oz (79.3 kg) IBW/kg (Calculated) : 79.9 Heparin Dosing Weight: 85.7 kg   Vital Signs: Temp: 98.7 F (37.1 C) (03/10 1959) Temp Source: Oral (03/10 1959) BP: 176/87 (03/10 1959) Pulse Rate: 55 (03/10 1959)  Labs:  Recent Labs  08/06/16 0527 08/07/16 0323 08/07/16 2205 08/08/16 0606 08/08/16 1345 08/09/16 0512  HGB  --   --   --   --  10.9*  --   HCT  --   --   --   --  31.2*  --   PLT  --   --   --   --  162  --   APTT  --   --  37*  --   --   --   LABPROT  --   --  12.5  --   --   --   INR  --   --  0.93  --   --   --   HEPARINUNFRC  --   --   --  0.63 0.48 0.46  CREATININE 5.07* 6.75*  --  5.44*  --   --     Estimated Creatinine Clearance: 17.2 mL/min (by C-G formula based on SCr of 5.44 mg/dL (H)).   Medical History: Past Medical History:  Diagnosis Date  . Anemia   . Diabetes mellitus without complication ([place])   . Dialysis patient [facility])    Mon. -Wed.- Fri  . ESRD (end stage renal disease) ([place])   . Hyperlipidemia   . Malignant hypertension   . Renal artery stenosis ([place])   . Schizophrenia ([place])     Medications:  Prescriptions Prior to Admission  Medication Sig Dispense Refill Last Dose  . amLODipine (NORVASC) 10 MG tablet Take 10 mg by mouth daily.   unknown at unknown  . atenolol (TENORMIN) 25 MG tablet Take 25 mg by mouth 2 (two) times daily.    unknown at unknown  . b complex-vitamin c-folic acid (NEPHRO-VITE) 0.8 MG TABS tablet Take 1 tablet by mouth daily.   unknown at unknown  . calcium acetate (PHOSLO) 667 MG capsule Take 2 capsules (1,334 mg total) by mouth 3 (three) times daily with meals. 60 capsule 1 unknown at unknown  .  carbamazepine (TEGRETOL XR) 200 MG 12 hr tablet Take 1,000 mg by mouth every morning.   unknown at unknown  . cholecalciferol (VITAMIN D) 1000 UNITS tablet Take 1,000 Units by mouth daily.   unknown at unknown  . citalopram (CELEXA) 20 MG tablet Take 1 tablet (20 mg total) by mouth daily. 30 tablet 1 unknown at unknown  . cloNIDine (CATAPRES) 0.2 MG tablet Take 1 tablet (0.2 mg total) by mouth 3 (three) times daily. 90 tablet 1 unknown at unknown  . ferrous sulfate 325 (65 FE) MG tablet Take 1 tablet (325 mg total) by mouth daily. 30 tablet 3 unknown at unknown  . fluPHENAZine (PROLIXIN) 2.5 MG tablet Take 2.5 mg by mouth daily.    unknown at unknown  . Fluticasone-Salmeterol (ADVAIR DISKUS) 250-50 MCG/DOSE AEPB Inhale 1 puff into the lungs 2 (two) times daily.   unknown at unknown  . hydrALAZINE (APRESOLINE) 100 MG tablet Take 1 tablet (100  mg total) by mouth every 6 (six) hours. (Patient taking differently: Take 100 mg by mouth 2 (two) times daily. ) 120 tablet 1 unknown at unknown  . minoxidil (LONITEN) 2.5 MG tablet Take 2 tablets (5 mg total) by mouth daily. 60 tablet 1 unknown at unknown  . trihexyphenidyl (ARTANE) 5 MG tablet Take 5 mg by mouth 3 (three) times daily with meals.   unknown at unknown  . acetaminophen (TYLENOL) 500 MG tablet Take 500 mg by mouth every 4 (four) hours as needed for mild pain. Take 1 tablet every 4-6 hours as needed for pain.   prn at prn  . albuterol (PROAIR HFA) 108 (90 Base) MCG/ACT inhaler Inhale 1-2 puffs into the lungs every 6 (six) hours as needed for wheezing or shortness of breath (cough).   prn at prn  . amoxicillin-clavulanate (AUGMENTIN) 500-125 MG tablet Take 1 tablet (500 mg total) by mouth 3 (three) times daily. 15 tablet 0   . baclofen (LIORESAL) 10 MG tablet Take 1 tablet (10 mg total) by mouth 2 (two) times daily as needed for muscle spasms. 30 each 0 05/25/2016 at Unknown time  . benzonatate (TESSALON) 200 MG capsule Take 1 capsule (200 mg total) by  mouth 2 (two) times daily as needed for cough. 20 capsule 0   . calcium acetate (PHOSLO) 667 MG capsule Take 1,334 mg by mouth 3 (three) times daily with meals.    unknown at unknown  . carbamazepine (TEGRETOL) 200 MG tablet Take 2.5 tablets (500 mg total) by mouth 2 (two) times daily. Taken 2 1/2 tabs (500mg  ) twice a day (Patient not taking: Reported on 04/14/2016) 150 tablet 0 Not Taking  . cyclobenzaprine (FLEXERIL) 5 MG tablet Take 5 mg by mouth at bedtime as needed for muscle spasms.   prn at prn  . fluticasone furoate-vilanterol (BREO ELLIPTA) 100-25 MCG/INH AEPB Inhale 1 puff into the lungs daily.   unknown at unknown  . traMADol (ULTRAM) 50 MG tablet Take 1 tablet (50 mg total) by mouth every 6 (six) hours as needed. 30 tablet 0 unknown at Unknown time    Assessment: Pharmacy consulted to dose heparin in this 55 year old male with VTE.   CrCl = 13.7 ml/min.    Pt received heparin 5000 units SQ on 3/9 @ 0530.   Goal of Therapy:  Heparin level 0.3-0.7 units/ml Monitor platelets by anticoagulation protocol: Yes   Plan:  Give 5000 units bolus x 1 Start heparin infusion at 1500 units/hr Check anti-Xa level in 8 hours and daily while on heparin Continue to monitor H&H and platelets  3/10 AM heparin level 0.63. Recheck in 8 hours to confirm. 3/10 Heparin level resulted @ 0.48. Continue current rate. Recheck heparin level in am   3/11 AM heparin level 0.46. Continue current regimen. Recheck heparin level and CBC with tomorrow AM labs.  Samson Ralph S 08/09/2016,5:56 AM

## 2016-08-09 NOTE — Evaluation (Signed)
Physical Therapy Evaluation Patient Details Name: Todd Brown MRN: [id number] DOB: 1961-02-25 Today's Date: 08/09/2016   History of Present Illness  Pt is a 56 y.o. male presenting to hospital with generalized weakness and 1 fall.  Pt with cardiac arrest in ED (30 minutes ACLS before ROSC) and received emergent HD.  ETT placed 08/03/16 and extubated 08/04/16.  Pt admitted with cardiac arrest d/t hyperkalemia and acute respiratory failure.  Pt found to have acute R occlusive cephalic and basilic vein thrombosis and heparin drip started 08/07/16.  PMH includes anemia, DM, HD MWF, schizophrenia, malgnant hypertension, Hep C, ESRD.  Clinical Impression  Prior to hospital admission, pt was independent ambulating with SPC.  Pt lives alone in 1st floor apt.  Currently pt is SBA with bed mobility and CGA to min assist with transfers and ambulation 80 feet with RW.  Pt mildly unsteady with increased lateral sway at times requiring assist during ambulation but balance improved with distance ambulated.  Pt would benefit from skilled PT to address noted impairments and functional limitations.  Recommend pt discharge to home with 24/7 assist and HHPT when medically appropriate.     Follow Up Recommendations Home health PT;Supervision/Assistance - 24 hour    Equipment Recommendations  Rolling walker with 5" wheels    Recommendations for Other Services       Precautions / Restrictions Precautions Precautions: Fall Precaution Comments: L UE AV graft; R UE DVT Restrictions Weight Bearing Restrictions: No      Mobility  Bed Mobility Overal bed mobility: Needs Assistance Bed Mobility: Supine to Sit;Sit to Supine     Supine to sit: Supervision;HOB elevated Sit to supine: Supervision;HOB elevated   General bed mobility comments: SBA for lines  Transfers Overall transfer level: Needs assistance Equipment used: Rolling walker (2 wheeled) Transfers: Sit to/from Stand Sit to Stand: Min guard;Min  assist         General transfer comment: assist to initiate stand first attempt but then pt able to come to full upright with CGA  Ambulation/Gait Ambulation/Gait assistance: Min guard;Min assist Ambulation Distance (Feet): 80 Feet Assistive device: Rolling walker (2 wheeled) Gait Pattern/deviations: Step-through pattern Gait velocity: decreased   General Gait Details: mildly unsteady at times (increased lateral sway) requiring occasional min assist to steady  Stairs            Wheelchair Mobility    Modified Rankin (Stroke Patients Only)       Balance Overall balance assessment: Needs assistance;History of Falls Sitting-balance support: No upper extremity supported;Feet supported Sitting balance-[name]: Good     Standing balance support: Bilateral upper extremity supported (on RW) Standing balance-[name]: Fair Standing balance comment: static standing                             Pertinent Vitals/Pain Pain Assessment: 0-10 Pain Score: 4  Pain Location: chest pain with deep breaths (from chest compressions) Pain Descriptors / Indicators: Sore Pain Intervention(s): Limited activity within patient's tolerance;Monitored during session;Repositioned  Vitals (HR and O2 on 3 L via nasal cannula) stable and WFL throughout treatment session.    Home Living Family/patient expects to be discharged to:: Private residence Living Arrangements: Alone   Type of Home: Apartment Home Access: Level entry     Home Layout: One [place]: [place] - single point      Prior Function Level of Independence: Independent with assistive device(s)  Comments: Pt reports using SPC prior to admission.  Pt reports no other falls except for most recent fall in last 6 months.     Hand Dominance        Extremity/Trunk Assessment   Upper Extremity Assessment Upper Extremity Assessment: Generalized weakness (Deferred R UE d/t DVT)    Lower  Extremity Assessment Lower Extremity Assessment: Generalized weakness       Communication   Communication: HOH  Cognition Arousal/Alertness: Awake/alert Behavior During Therapy: WFL for tasks assessed/performed Overall Cognitive Status: History of cognitive impairments - at baseline (Oriented to person, place and year (with multiple choice))                      General Comments General comments (skin integrity, edema, etc.): Pt's mother present during session.  Nursing cleared pt for participation in physical therapy.  Pt agreeable to PT session.    Exercises     Assessment/Plan    PT Assessment Patient needs continued PT services  PT Problem List Decreased strength;Decreased balance;Decreased mobility;Decreased knowledge of use of DME       PT Treatment Interventions DME instruction;Gait training;Functional mobility training;Therapeutic activities;Therapeutic exercise;Balance training;Patient/family education    PT Goals (Current goals can be found in the Care Plan section)  Acute Rehab PT Goals Patient Stated Goal: to walk more PT Goal Formulation: With patient Time For Goal Achievement: 08/23/16 Potential to Achieve Goals: Good    Frequency Min 2X/week   Barriers to discharge Decreased caregiver support      Co-evaluation               End of Session Equipment Utilized During Treatment: Gait belt Activity Tolerance: Patient tolerated treatment well Patient left: in bed;with call bell/phone within reach;with bed alarm set;with family/visitor present Nurse Communication: Mobility status;Precautions PT Visit Diagnosis: Unsteadiness on feet (R26.81);Other abnormalities of gait and mobility (R26.89);History of falling (Z91.81);Muscle weakness (generalized) (M62.81)         Time: 9791-5041 PT Time Calculation (min) (ACUTE ONLY): 20 min   Charges:   PT Evaluation $PT Eval Low Complexity: 1 Procedure     PT G CodesLeitha Bleak,  PT 08/09/16, 1:39 PM [phone]

## 2016-08-09 NOTE — Progress Notes (Signed)
Pt. Slept throughout the night only waking to take pain medication and to use urinal. No signs or c/o pain, SOB or acute distress observed.

## 2016-08-10 LAB — CBC
HEMATOCRIT: 31.2 % — AB (ref 40.0–52.0)
HEMOGLOBIN: 10.9 g/dL — AB (ref 13.0–18.0)
MCH: 37 pg — ABNORMAL HIGH (ref 26.0–34.0)
MCHC: 34.9 g/dL (ref 32.0–36.0)
MCV: 106.1 fL — ABNORMAL HIGH (ref 80.0–100.0)
Platelets: 172 10*3/uL (ref 150–440)
RBC: 2.94 MIL/uL — ABNORMAL LOW (ref 4.40–5.90)
RDW: 15 % — AB (ref 11.5–14.5)
WBC: 6.8 10*3/uL (ref 3.8–10.6)

## 2016-08-10 LAB — GLUCOSE, CAPILLARY
GLUCOSE-CAPILLARY: 118 mg/dL — AB (ref 65–99)
GLUCOSE-CAPILLARY: 107 mg/dL — AB (ref 65–99)

## 2016-08-10 LAB — RENAL FUNCTION PANEL
Albumin: 2.8 g/dL — ABNORMAL LOW (ref 3.5–5.0)
Anion gap: 9 (ref 5–15)
BUN: 49 mg/dL — AB (ref 6–20)
CHLORIDE: 99 mmol/L — AB (ref 101–111)
CO2: 30 mmol/L (ref 22–32)
Calcium: 8.6 mg/dL — ABNORMAL LOW (ref 8.9–10.3)
Creatinine, Ser: 7.76 mg/dL — ABNORMAL HIGH (ref 0.61–1.24)
GFR calc Af Amer: 8 mL/min — ABNORMAL LOW (ref >60)
GFR, EST NON AFRICAN AMERICAN: 7 mL/min — AB (ref >60)
Glucose, Bld: 89 mg/dL (ref 65–99)
POTASSIUM: 4.1 mmol/L (ref 3.5–5.1)
Phosphorus: 4.2 mg/dL (ref 2.5–4.6)
Sodium: 138 mmol/L (ref 135–145)

## 2016-08-10 LAB — PROTIME-INR
INR: 0.97
PROTHROMBIN TIME: 12.9 s (ref 11.4–15.2)

## 2016-08-10 LAB — HEPARIN LEVEL (UNFRACTIONATED): HEPARIN UNFRACTIONATED: 0.35 [IU]/mL (ref 0.30–0.70)

## 2016-08-10 MED ORDER — HYDRALAZINE HCL 50 MG PO TABS
100.0000 mg | ORAL_TABLET | Freq: Once | ORAL | Status: AC
  Administered 2016-08-10: 100 mg via ORAL
  Filled 2016-08-10: qty 2

## 2016-08-10 MED ORDER — WARFARIN SODIUM 5 MG PO TABS
7.5000 mg | ORAL_TABLET | Freq: Once | ORAL | Status: AC
  Administered 2016-08-10: 7.5 mg via ORAL
  Filled 2016-08-10: qty 2

## 2016-08-10 MED ORDER — CLONIDINE HCL 0.1 MG PO TABS
0.2000 mg | ORAL_TABLET | Freq: Three times a day (TID) | ORAL | Status: DC
  Administered 2016-08-10 – 2016-08-17 (×20): 0.2 mg via ORAL
  Filled 2016-08-10 (×20): qty 2

## 2016-08-10 MED ORDER — HYDRALAZINE HCL 50 MG PO TABS
100.0000 mg | ORAL_TABLET | Freq: Three times a day (TID) | ORAL | Status: DC
  Administered 2016-08-10 – 2016-08-12 (×5): 100 mg via ORAL
  Filled 2016-08-10 (×5): qty 2

## 2016-08-10 NOTE — Progress Notes (Signed)
This note also relates to the following rows which could not be included: BP - Cannot attach notes to unvalidated device data  Pre-hd tx

## 2016-08-10 NOTE — Progress Notes (Signed)
Hemodialysis started

## 2016-08-10 NOTE — Progress Notes (Signed)
Post hd vitals 

## 2016-08-10 NOTE — Progress Notes (Signed)
Patients BP in 387C systolic. MD notified. Clonidine 0.2mg  ordered TID. One time hydralazine 100mg  dose ordered as well. Will continue to monitor.

## 2016-08-10 NOTE — Progress Notes (Signed)
PT Cancellation Note  Patient Details Name: Todd Brown MRN: [id number] DOB: [birth year]   Cancelled Treatment:    Reason Eval/Treat Not Completed: Other (comment). Pt currently out of room for dialysis treatment, unable to perform therapy. Will re-attempt another time.   Cher Egnor 08/10/2016, 11:04 AM  [name], PT, DPT [phone]

## 2016-08-10 NOTE — Progress Notes (Signed)
Subjective:  Patient seen and evaluated during hemodialysis. Appears to be tolerating well. Blood pressure is high at 205/93.  Objective:  Vital signs in last 24 hours:  Temp:  [98.2 F (36.8 C)-98.8 F (37.1 C)] 98.2 F (36.8 C) (03/12 0803) Pulse Rate:  [54-66] 54 (03/12 1120) Resp:  [11-18] 11 (03/12 1120) BP: (173-218)/(75-119) 205/93 (03/12 1120) SpO2:  [93 %-99 %] 97 % (03/12 1120) Weight:  [79.2 kg (174 lb 9.6 oz)] 79.2 kg (174 lb 9.6 oz) (03/12 1040)  Weight change: -0.102 kg (-3.6 oz) Filed Weights   08/09/16 0500 08/10/16 0427 08/10/16 1040  Weight: 79.3 kg (174 lb 13.2 oz) 79.2 kg (174 lb 9.6 oz) 79.2 kg (174 lb 9.6 oz)    Intake/Output:    Intake/Output Summary (Last 24 hours) at 08/10/16 1149 Last data filed at 08/10/16 1025  Gross per 24 hour  Intake              405 ml  Output              300 ml  Net              105 ml     Physical Exam: General: Chronically ill appearing  HEENT Anicteric  Neck supple  Pulm/lungs Scattered rhonchi, normal effort   CVS/Heart Regular rhythm  Abdomen:  Soft, nontender, nondistended  Extremities: Trace edema  Neurologic: Awake, alert, following commands   Skin: Dry skin  Access: Left upper arm AV fistula       Basic Metabolic Panel:   Recent Labs Lab 08/03/16 1923 08/04/16 0207 08/05/16 0514 08/06/16 0527 08/07/16 0323 08/08/16 0606  NA  --  137 140 135 136 139  K 3.8 5.5* 5.4* 5.4* 4.7 3.9  CL  --  99* 103 97* 100* 99*  CO2  --  29 26 26 28 30   GLUCOSE  --  77 81 88 102* 81  BUN  --  28* 24* 22* 32* 25*  CREATININE  --  5.08* 4.94* 5.07* 6.75* 5.44*  CALCIUM  --  8.5* 8.5* 9.1 8.5* 8.7*  MG 1.9  --   --   --   --   --      CBC:  Recent Labs Lab 08/03/16 1316 08/04/16 0237 08/05/16 0514 08/08/16 1345 08/10/16 0346  WBC 9.2 12.3* 12.1* 7.1 6.8  NEUTROABS 7.9*  --   --   --   --   HGB 12.7* 12.0* 11.3* 10.9* 10.9*  HCT 37.1* 34.7* 33.2* 31.2* 31.2*  MCV 107.0* 106.6* 104.2* 105.1*  106.1*  PLT 282 159 146* 162 172      Microbiology:  Recent Results (from the past 720 hour(s))  MRSA PCR Screening     Status: None   Collection Time: 08/03/16  6:09 PM  Result Value Ref Range Status   MRSA by PCR NEGATIVE NEGATIVE Final    Comment:        The GeneXpert MRSA Assay (FDA approved for NASAL specimens only), is one component of a comprehensive MRSA colonization surveillance program. It is not intended to diagnose MRSA infection nor to guide or monitor treatment for MRSA infections.     Coagulation Studies:  Recent Labs  08/07/16 2205 08/09/16 1325 08/10/16 0346  LABPROT 12.5 12.9 12.9  INR 0.93 0.97 0.97    Urinalysis: No results for input(s): COLORURINE, LABSPEC, PHURINE, GLUCOSEU, HGBUR, BILIRUBINUR, KETONESUR, PROTEINUR, UROBILINOGEN, NITRITE, LEUKOCYTESUR in the last 72 hours.  Invalid input(s): APPERANCEUR    Imaging:  No results found.   Medications:   . heparin 1,500 Units/hr (08/09/16 2223)   . amLODipine  10 mg Oral Daily  . calcium acetate  1,334 mg Oral TID WC  . carbamazepine  500 mg Oral BID  . docusate sodium  100 mg Oral Daily  . epoetin (EPOGEN/PROCRIT) injection  4,000 Units Intravenous Q M,W,F-HD  . feeding supplement (NEPRO CARB STEADY)  237 mL Oral BID BM  . fluPHENAZine  2.5 mg Oral Daily  . hydrALAZINE  50 mg Oral Q8H  . Influenza vac split quadrivalent PF  0.5 mL Intramuscular Tomorrow-1000  . insulin aspart  0-15 Units Subcutaneous TID WC  . insulin aspart  0-5 Units Subcutaneous QHS  . pneumococcal 23 valent vaccine  0.5 mL Intramuscular Tomorrow-1000  . warfarin  7.5 mg Oral ONCE-1800  . Warfarin - Pharmacist Dosing Inpatient   Does not apply q1800   acetaminophen, HYDROcodone-acetaminophen, ipratropium-albuterol, morphine injection, nitroGLYCERIN  Assessment/ Plan:  56 y.o.african American male  With schizophrenia, hypertension, hepatitis C, tobacco abuse, anemia of chronic kidney disease, secondary  hyperparathyroidism, end-stage renal disease  [facility] MWF [place].   1.  End-stage renal disease on HD MWF 2.  Severe hyperkalemia, life threatening 3.  Acute respiratory failure, extubated 3/6 4.  Anemia of chronic kidney disease. Hgb 10.9  -Pt seen and evaluated during dialysis, potassium 3.9 at last check. We plan to recheck serum electrolytes during dialysis today. We plan to complete hemodialysis today. He appears to be doing well from a respiratory perspective. His anemia is also stable with a hemoglobin 10.9.     LOS: 7 Westen Dinino 3/12/201811:49 AM

## 2016-08-10 NOTE — Progress Notes (Signed)
[facility] [place] NAME: Kevonta Phariss    MR#:  [id number]  DATE OF BIRTH:  08-20-1960  CHIEF COMPLAINT:   Chief Complaint  Patient presents with  . Weakness  Patient still very weak but not have any complaints currently      REVIEW OF SYSTEMS:   Review of Systems  Constitutional: Negative for chills and fever.  HENT: Negative for hearing loss.   Eyes: Negative for blurred vision, double vision and photophobia.  Respiratory: Positive for cough. Negative for hemoptysis and shortness of breath.   Cardiovascular: Negative for palpitations, orthopnea and leg swelling.  Gastrointestinal: Negative for abdominal pain, diarrhea and vomiting.  Genitourinary: Negative for dysuria and urgency.  Musculoskeletal: Negative for myalgias and neck pain.  Skin: Negative for rash.  Neurological: Negative for dizziness, focal weakness, seizures, weakness and headaches.  Psychiatric/Behavioral: Negative for memory loss. The patient does not have insomnia.    is very slow to answer but answers appropriately.  DRUG ALLERGIES:   Allergies  Allergen Reactions  . Thorazine [Chlorpromazine] Other (See Comments)    Reaction:  Unknown , pt states it makes him feel real bad    VITALS:  Blood pressure (!) 194/86, pulse (!) 57, temperature 98.5 F (36.9 C), temperature source Oral, resp. rate 11, height 6\' 1"  (1.854 m), weight 170 lb 6.7 oz (77.3 kg), SpO2 96 %.  PHYSICAL EXAMINATION:  GENERAL:  56 y.o.-year-old patient lying in the bed with no acute distress.Overall ill-appearing maleEYES: Pupils equal, round, reactive to light and . No scleral icterus. Extraocular muscles intact.  HEENT: Head atraumatic, normocephalic. Oropharynx and nasopharynx clear.  NECK:  Supple, no jugular venous distention. No thyroid enlargement, no tenderness.  LUNGS: Normal breath sounds bilaterally, no wheezing, rales,rhonchi or crepitation. No use of accessory muscles  of respiration.  CARDIOVASCULAR: S1, S2 normal. No murmurs, rubs, or gallops.  ABDOMEN: Soft, nontender, nondistended. Bowel sounds present. No organomegaly or mass.  EXTREMITIES: No pedal edema, cyanosis, or clubbing.  NEUROLOGIC:  unable to follow directions for this exam.  PSYCHIATRIC: The patient is alert and oriented x 3.  SKIN: No obvious rash, lesion, or ulcer.    LABORATORY PANEL:   CBC  Recent Labs Lab 08/10/16 0346  WBC 6.8  HGB 10.9*  HCT 31.2*  PLT 172   ------------------------------------------------------------------------------------------------------------------  Chemistries   Recent Labs Lab 08/03/16 1923  08/10/16 0346  NA  --   < > 138  K 3.8  < > 4.1  CL  --   < > 99*  CO2  --   < > 30  GLUCOSE  --   < > 89  BUN  --   < > 49*  CREATININE  --   < > 7.76*  CALCIUM  --   < > 8.6*  MG 1.9  --   --   < > = values in this interval not displayed. ------------------------------------------------------------------------------------------------------------------  Cardiac Enzymes  Recent Labs Lab 08/05/16 0514  TROPONINI 0.34*   ------------------------------------------------------------------------------------------------------------------  RADIOLOGY:  No results found.  EKG:   Orders placed or performed during the hospital encounter of 08/03/16  . EKG 12-Lead  . EKG 12-Lead  . EKG 12-Lead  . EKG 12-Lead    ASSESSMENT AND PLAN:   #1 acute respiratory failure secondary to cardiac arrest with prolonged resuscitation with the ACLS protocol was more than 30 minutes as per the chart:  Continue oxygen We will recheck chest x-ray due to concern  of pneumonia   #2 Acute on chronic diastolic heart failure, severe pulmonary hypertension: on HD.  #3 ESRD on hemodialysis, HD per nephrology   # 4 severe life-threatening hyperkalemia: Improved, Potassium  7.5 on March 5th now 4.7 today.  #5 history of schizophrenia:  He is on prolixinat home  restared,continue Lamictal.  #6 essential hypertension: Controlled him on now has bradycardia, clonidine, atenolol, use hydralazine for blood pressure, start Norvasc.   #7. RUL DVT   Heparin IV, spoke to Vascualr- may give oral agent when stable, for 3 months.   Due to kidney issues and being on carbamazepine, he is not a candidate for new or oral anticoagulant agents, continue warfarin  All the records are reviewed and case discussed with Care Management/Social Workerr. Management plans discussed with the patient, family and they are in agreement.  CODE STATUS: full  TOTAL TIME TAKING CARE OF THIS PATIENT: 35 minutes.   POSSIBLE D/C IN 3-4DAYS, DEPENDING ON CLINICAL CONDITION.   Dustin Flock M.D on 08/10/2016 at 2:50 PM  Between 7am to 6pm - Pager - [phone]  After 6pm go to [email or web address] - password [email or web address] [facility]  Office  [phone]  CC: Primary care physician; Nino Glow McLean-Scocuzza, MD   Note: This dictation was prepared with Dragon dictation along with smaller phrase technology. Any transcriptional errors that result from this process are unintentional.

## 2016-08-10 NOTE — Care Management Note (Signed)
Case Management Note  Patient Details  Name: Todd Brown MRN: [id number] Date of Birth: [birth year]  Subjective/Objective:  S/P cardiac arrest seconday to hyperkalemia.  HD patient , [facility] [address] Dailysis coordinator, Elvera Bicker, updated.  Spoke with patients mother, Elodia Florence [phone]). She states patient lived alone prior to admission. He has someone through his medicaid that comes in 7 days per week to administer his medications. Most likely an ACT team due to his schizophrenia history. She is looking for a CNA to come in and help him for several hours per day. I encouraged her since she does not live in the county and denies knowledge on how to apply for PCS services to speak with his ACT team about it further and social services.  She is also concerned about him getting PT at home. I explained to her that PT was attempting to work with him at the hospital and we could more define a discharge disposition at that time. She is limited in her ability to help him .                Action/Plan: Following PT and progression.    Expected Discharge Date:                  Expected Discharge Plan:     In-House Referral:     Discharge planning Services  CM Consult  Post Acute Care Choice:    Choice offered to:     DME Arranged:    DME Agency:     HH Arranged:    HH Agency:     Status of Service:     If discussed at [employer], dates discussed:    Additional Comments:  Jolly Mango, RN 08/10/2016, 2:33 PM

## 2016-08-10 NOTE — Progress Notes (Signed)
Pre-hd tx 

## 2016-08-10 NOTE — Progress Notes (Signed)
ANTICOAGULATION CONSULT NOTE - Initial Consult  Pharmacy Consult for warfarin Indication: DVT  Allergies  Allergen Reactions  . Thorazine [Chlorpromazine] Other (See Comments)    Reaction:  Unknown , pt states it makes him feel real bad    Patient Measurements: Height: 6\' 1"  (185.4 cm) Weight: 174 lb 9.6 oz (79.2 kg) IBW/kg (Calculated) : 79.9  Vital Signs: Temp: 98.2 F (36.8 C) (03/12 0427) Temp Source: Oral (03/12 0427) BP: 198/87 (03/12 0427) Pulse Rate: 58 (03/12 0427)  Labs:  Recent Labs  08/07/16 2205  08/08/16 0606 08/08/16 1345 08/09/16 0512 08/09/16 1325 08/10/16 0346  HGB  --   --   --  10.9*  --   --  10.9*  HCT  --   --   --  31.2*  --   --  31.2*  PLT  --   --   --  162  --   --  172  APTT 37*  --   --   --   --   --   --   LABPROT 12.5  --   --   --   --  12.9 12.9  INR 0.93  --   --   --   --  0.97 0.97  HEPARINUNFRC  --   < > 0.63 0.48 0.46  --  0.35  CREATININE  --   --  5.44*  --   --   --   --   < > = values in this interval not displayed.  Estimated Creatinine Clearance: 17.2 mL/min (by C-G formula based on SCr of 5.44 mg/dL (H)).   Medical History: Past Medical History:  Diagnosis Date  . Anemia   . Diabetes mellitus without complication ([place])   . Dialysis patient [facility])    Mon. -Wed.- Fri  . ESRD (end stage renal disease) ([place])   . Hyperlipidemia   . Malignant hypertension   . Renal artery stenosis ([place])   . Schizophrenia ([place])    Medications:  Prescriptions Prior to Admission  Medication Sig Dispense Refill Last Dose  . amLODipine (NORVASC) 10 MG tablet Take 10 mg by mouth daily.   unknown at unknown  . atenolol (TENORMIN) 25 MG tablet Take 25 mg by mouth 2 (two) times daily.    unknown at unknown  . b complex-vitamin c-folic acid (NEPHRO-VITE) 0.8 MG TABS tablet Take 1 tablet by mouth daily.   unknown at unknown  . calcium acetate (PHOSLO) 667 MG capsule Take 2 capsules (1,334 mg total) by mouth 3 (three) times daily with meals.  60 capsule 1 unknown at unknown  . carbamazepine (TEGRETOL XR) 200 MG 12 hr tablet Take 1,000 mg by mouth every morning.   unknown at unknown  . cholecalciferol (VITAMIN D) 1000 UNITS tablet Take 1,000 Units by mouth daily.   unknown at unknown  . citalopram (CELEXA) 20 MG tablet Take 1 tablet (20 mg total) by mouth daily. 30 tablet 1 unknown at unknown  . cloNIDine (CATAPRES) 0.2 MG tablet Take 1 tablet (0.2 mg total) by mouth 3 (three) times daily. 90 tablet 1 unknown at unknown  . ferrous sulfate 325 (65 FE) MG tablet Take 1 tablet (325 mg total) by mouth daily. 30 tablet 3 unknown at unknown  . fluPHENAZine (PROLIXIN) 2.5 MG tablet Take 2.5 mg by mouth daily.    unknown at unknown  . Fluticasone-Salmeterol (ADVAIR DISKUS) 250-50 MCG/DOSE AEPB Inhale 1 puff into the lungs 2 (two) times daily.   unknown at  unknown  . hydrALAZINE (APRESOLINE) 100 MG tablet Take 1 tablet (100 mg total) by mouth every 6 (six) hours. (Patient taking differently: Take 100 mg by mouth 2 (two) times daily. ) 120 tablet 1 unknown at unknown  . minoxidil (LONITEN) 2.5 MG tablet Take 2 tablets (5 mg total) by mouth daily. 60 tablet 1 unknown at unknown  . trihexyphenidyl (ARTANE) 5 MG tablet Take 5 mg by mouth 3 (three) times daily with meals.   unknown at unknown  . acetaminophen (TYLENOL) 500 MG tablet Take 500 mg by mouth every 4 (four) hours as needed for mild pain. Take 1 tablet every 4-6 hours as needed for pain.   prn at prn  . albuterol (PROAIR HFA) 108 (90 Base) MCG/ACT inhaler Inhale 1-2 puffs into the lungs every 6 (six) hours as needed for wheezing or shortness of breath (cough).   prn at prn  . amoxicillin-clavulanate (AUGMENTIN) 500-125 MG tablet Take 1 tablet (500 mg total) by mouth 3 (three) times daily. 15 tablet 0   . baclofen (LIORESAL) 10 MG tablet Take 1 tablet (10 mg total) by mouth 2 (two) times daily as needed for muscle spasms. 30 each 0 05/25/2016 at Unknown time  . benzonatate (TESSALON) 200 MG  capsule Take 1 capsule (200 mg total) by mouth 2 (two) times daily as needed for cough. 20 capsule 0   . calcium acetate (PHOSLO) 667 MG capsule Take 1,334 mg by mouth 3 (three) times daily with meals.    unknown at unknown  . carbamazepine (TEGRETOL) 200 MG tablet Take 2.5 tablets (500 mg total) by mouth 2 (two) times daily. Taken 2 1/2 tabs (500mg  ) twice a day (Patient not taking: Reported on 04/14/2016) 150 tablet 0 Not Taking  . cyclobenzaprine (FLEXERIL) 5 MG tablet Take 5 mg by mouth at bedtime as needed for muscle spasms.   prn at prn  . fluticasone furoate-vilanterol (BREO ELLIPTA) 100-25 MCG/INH AEPB Inhale 1 puff into the lungs daily.   unknown at unknown  . traMADol (ULTRAM) 50 MG tablet Take 1 tablet (50 mg total) by mouth every 6 (six) hours as needed. 30 tablet 0 unknown at Unknown time   Scheduled:  . amLODipine  10 mg Oral Daily  . calcium acetate  1,334 mg Oral TID WC  . carbamazepine  500 mg Oral BID  . docusate sodium  100 mg Oral Daily  . epoetin (EPOGEN/PROCRIT) injection  4,000 Units Intravenous Q M,W,F-HD  . feeding supplement (NEPRO CARB STEADY)  237 mL Oral BID BM  . fluPHENAZine  2.5 mg Oral Daily  . hydrALAZINE  50 mg Oral Q8H  . Influenza vac split quadrivalent PF  0.5 mL Intramuscular Tomorrow-1000  . insulin aspart  0-15 Units Subcutaneous TID WC  . insulin aspart  0-5 Units Subcutaneous QHS  . pneumococcal 23 valent vaccine  0.5 mL Intramuscular Tomorrow-1000  . warfarin  7.5 mg Oral ONCE-1800  . Warfarin - Pharmacist Dosing Inpatient   Does not apply q1800   Infusions:  . heparin 1,500 Units/hr (08/09/16 2223)    Assessment: Pharmacy consulted to dose and monitor warfarin in this 56 year old male for treatment of a right upper extremity DVT. Patient also has ESRD on HD and has been anticoagulated with a heparin drip.   Patient not eligible for DOAC due to contraindicated drug interaction with carbamazepine. Patient is being started on warfarin today with  heparin bridge.   Goal of Therapy:  INR 2-3 Monitor platelets by anticoagulation  protocol: Yes   Plan:  Continue heparin drip at current infusion rate of 1500 units/hr. Next HL and CBC ordered with AM labs tomorrow.  INR is 0.97 after one dose of warfarin 7.5mg . Will give warfarin 7.5 mg PO again this evening. Starting dose of 7.5 mg was chosen due to DDI with carbamazepine as carbamazepine induces metabolism of warfarin. Will check INR with AM labs tomorrow.  Ramond Dial, PharmD, BCPS Clinical Pharmacist 08/10/2016,7:49 AM

## 2016-08-10 NOTE — Progress Notes (Signed)
ANTICOAGULATION CONSULT NOTE - Initial Consult  Pharmacy Consult for Heparin  Indication: VTE prophylaxis  Allergies  Allergen Reactions  . Thorazine [Chlorpromazine] Other (See Comments)    Reaction:  Unknown , pt states it makes him feel real bad    Patient Measurements: Height: 6\' 1"  (185.4 cm) Weight: 174 lb 9.6 oz (79.2 kg) IBW/kg (Calculated) : 79.9 Heparin Dosing Weight: 85.7 kg   Vital Signs: Temp: 98.2 F (36.8 C) (03/12 0427) Temp Source: Oral (03/12 0427) BP: 198/87 (03/12 0427) Pulse Rate: 58 (03/12 0427)  Labs:  Recent Labs  08/07/16 2205  08/08/16 0606 08/08/16 1345 08/09/16 0512 08/09/16 1325 08/10/16 0346  HGB  --   --   --  10.9*  --   --  10.9*  HCT  --   --   --  31.2*  --   --  31.2*  PLT  --   --   --  162  --   --  172  APTT 37*  --   --   --   --   --   --   LABPROT 12.5  --   --   --   --  12.9 12.9  INR 0.93  --   --   --   --  0.97 0.97  HEPARINUNFRC  --   < > 0.63 0.48 0.46  --  0.35  CREATININE  --   --  5.44*  --   --   --   --   < > = values in this interval not displayed.  Estimated Creatinine Clearance: 17.2 mL/min (by C-G formula based on SCr of 5.44 mg/dL (H)).   Medical History: Past Medical History:  Diagnosis Date  . Anemia   . Diabetes mellitus without complication ([place])   . Dialysis patient [facility])    Mon. -Wed.- Fri  . ESRD (end stage renal disease) ([place])   . Hyperlipidemia   . Malignant hypertension   . Renal artery stenosis ([place])   . Schizophrenia ([place])     Medications:  Prescriptions Prior to Admission  Medication Sig Dispense Refill Last Dose  . amLODipine (NORVASC) 10 MG tablet Take 10 mg by mouth daily.   unknown at unknown  . atenolol (TENORMIN) 25 MG tablet Take 25 mg by mouth 2 (two) times daily.    unknown at unknown  . b complex-vitamin c-folic acid (NEPHRO-VITE) 0.8 MG TABS tablet Take 1 tablet by mouth daily.   unknown at unknown  . calcium acetate (PHOSLO) 667 MG capsule Take 2 capsules (1,334 mg  total) by mouth 3 (three) times daily with meals. 60 capsule 1 unknown at unknown  . carbamazepine (TEGRETOL XR) 200 MG 12 hr tablet Take 1,000 mg by mouth every morning.   unknown at unknown  . cholecalciferol (VITAMIN D) 1000 UNITS tablet Take 1,000 Units by mouth daily.   unknown at unknown  . citalopram (CELEXA) 20 MG tablet Take 1 tablet (20 mg total) by mouth daily. 30 tablet 1 unknown at unknown  . cloNIDine (CATAPRES) 0.2 MG tablet Take 1 tablet (0.2 mg total) by mouth 3 (three) times daily. 90 tablet 1 unknown at unknown  . ferrous sulfate 325 (65 FE) MG tablet Take 1 tablet (325 mg total) by mouth daily. 30 tablet 3 unknown at unknown  . fluPHENAZine (PROLIXIN) 2.5 MG tablet Take 2.5 mg by mouth daily.    unknown at unknown  . Fluticasone-Salmeterol (ADVAIR DISKUS) 250-50 MCG/DOSE AEPB Inhale 1 puff into the  lungs 2 (two) times daily.   unknown at unknown  . hydrALAZINE (APRESOLINE) 100 MG tablet Take 1 tablet (100 mg total) by mouth every 6 (six) hours. (Patient taking differently: Take 100 mg by mouth 2 (two) times daily. ) 120 tablet 1 unknown at unknown  . minoxidil (LONITEN) 2.5 MG tablet Take 2 tablets (5 mg total) by mouth daily. 60 tablet 1 unknown at unknown  . trihexyphenidyl (ARTANE) 5 MG tablet Take 5 mg by mouth 3 (three) times daily with meals.   unknown at unknown  . acetaminophen (TYLENOL) 500 MG tablet Take 500 mg by mouth every 4 (four) hours as needed for mild pain. Take 1 tablet every 4-6 hours as needed for pain.   prn at prn  . albuterol (PROAIR HFA) 108 (90 Base) MCG/ACT inhaler Inhale 1-2 puffs into the lungs every 6 (six) hours as needed for wheezing or shortness of breath (cough).   prn at prn  . amoxicillin-clavulanate (AUGMENTIN) 500-125 MG tablet Take 1 tablet (500 mg total) by mouth 3 (three) times daily. 15 tablet 0   . baclofen (LIORESAL) 10 MG tablet Take 1 tablet (10 mg total) by mouth 2 (two) times daily as needed for muscle spasms. 30 each 0 05/25/2016 at  Unknown time  . benzonatate (TESSALON) 200 MG capsule Take 1 capsule (200 mg total) by mouth 2 (two) times daily as needed for cough. 20 capsule 0   . calcium acetate (PHOSLO) 667 MG capsule Take 1,334 mg by mouth 3 (three) times daily with meals.    unknown at unknown  . carbamazepine (TEGRETOL) 200 MG tablet Take 2.5 tablets (500 mg total) by mouth 2 (two) times daily. Taken 2 1/2 tabs (500mg  ) twice a day (Patient not taking: Reported on 04/14/2016) 150 tablet 0 Not Taking  . cyclobenzaprine (FLEXERIL) 5 MG tablet Take 5 mg by mouth at bedtime as needed for muscle spasms.   prn at prn  . fluticasone furoate-vilanterol (BREO ELLIPTA) 100-25 MCG/INH AEPB Inhale 1 puff into the lungs daily.   unknown at unknown  . traMADol (ULTRAM) 50 MG tablet Take 1 tablet (50 mg total) by mouth every 6 (six) hours as needed. 30 tablet 0 unknown at Unknown time    Assessment: Pharmacy consulted to dose heparin in this 56 year old male with VTE.   CrCl = 13.7 ml/min.    Pt received heparin 5000 units SQ on 3/9 @ 0530.   Goal of Therapy:  Heparin level 0.3-0.7 units/ml Monitor platelets by anticoagulation protocol: Yes   Plan:  Give 5000 units bolus x 1 Start heparin infusion at 1500 units/hr Check anti-Xa level in 8 hours and daily while on heparin Continue to monitor H&H and platelets  3/10 AM heparin level 0.63. Recheck in 8 hours to confirm. 3/10 Heparin level resulted @ 0.48. Continue current rate. Recheck heparin level in am   3/11 AM heparin level 0.46. Continue current regimen. Recheck heparin level and CBC with tomorrow AM labs.  3/12 AM heparin level 0.35. Continue current regimen. Recheck heparin level and CBC with tomorrow AM labs.  Eluzer Howdeshell S 08/10/2016,5:54 AM

## 2016-08-10 NOTE — Progress Notes (Signed)
Post hd assessment 

## 2016-08-10 NOTE — Progress Notes (Signed)
  End of hd 

## 2016-08-11 LAB — GLUCOSE, CAPILLARY
GLUCOSE-CAPILLARY: 123 mg/dL — AB (ref 65–99)
GLUCOSE-CAPILLARY: 126 mg/dL — AB (ref 65–99)
Glucose-Capillary: 65 mg/dL (ref 65–99)
Glucose-Capillary: 118 mg/dL — ABNORMAL HIGH (ref 65–99)
Glucose-Capillary: 103 mg/dL — ABNORMAL HIGH (ref 65–99)
Glucose-Capillary: 104 mg/dL — ABNORMAL HIGH (ref 65–99)

## 2016-08-11 MED ORDER — WARFARIN SODIUM 5 MG PO TABS
10.0000 mg | ORAL_TABLET | Freq: Once | ORAL | Status: AC
  Administered 2016-08-11: 10 mg via ORAL
  Filled 2016-08-11: qty 2

## 2016-08-11 MED ORDER — MINOXIDIL 2.5 MG PO TABS
5.0000 mg | ORAL_TABLET | Freq: Every day | ORAL | Status: DC
  Filled 2016-08-11: qty 2

## 2016-08-11 MED ORDER — MINOXIDIL 2.5 MG PO TABS
5.0000 mg | ORAL_TABLET | Freq: Every day | ORAL | Status: DC
  Administered 2016-08-11 – 2016-08-13 (×3): 5 mg via ORAL
  Filled 2016-08-11 (×3): qty 2

## 2016-08-11 NOTE — Progress Notes (Signed)
[facility] [place] NAME: Todd Brown    MR#:  [id number]  DATE OF BIRTH:  [birth year]  CHIEF COMPLAINT:   Chief Complaint  Patient presents with  . Weakness  Patient still very weak denies any chest pain      REVIEW OF SYSTEMS:   Review of Systems  Constitutional: Negative for chills and fever.  HENT: Negative for hearing loss.   Eyes: Negative for blurred vision, double vision and photophobia.  Respiratory: Positive for cough. Negative for hemoptysis and shortness of breath.   Cardiovascular: Negative for palpitations, orthopnea and leg swelling.  Gastrointestinal: Negative for abdominal pain, diarrhea and vomiting.  Genitourinary: Negative for dysuria and urgency.  Musculoskeletal: Negative for myalgias and neck pain.  Skin: Negative for rash.  Neurological: Negative for dizziness, focal weakness, seizures, weakness and headaches.  Psychiatric/Behavioral: Negative for memory loss. The patient does not have insomnia.    is very slow to answer but answers appropriately.  DRUG ALLERGIES:   Allergies  Allergen Reactions  . Thorazine [Chlorpromazine] Other (See Comments)    Reaction:  Unknown , pt states it makes him feel real bad    VITALS:  Blood pressure (!) 172/75, pulse 60, temperature 98.6 F (37 C), temperature source Oral, resp. rate 18, height 6\' 1"  (1.854 m), weight 176 lb 8 oz (80.1 kg), SpO2 96 %.  PHYSICAL EXAMINATION:  GENERAL:  56 y.o.-year-old patient lying in the bed with no acute distress.Overall ill-appearing maleEYES: Pupils equal, round, reactive to light and . No scleral icterus. Extraocular muscles intact.  HEENT: Head atraumatic, normocephalic. Oropharynx and nasopharynx clear.  NECK:  Supple, no jugular venous distention. No thyroid enlargement, no tenderness.  LUNGS: Normal breath sounds bilaterally, no wheezing, rales,rhonchi or crepitation. No use of accessory muscles of respiration.   CARDIOVASCULAR: S1, S2 normal. No murmurs, rubs, or gallops.  ABDOMEN: Soft, nontender, nondistended. Bowel sounds present. No organomegaly or mass.  EXTREMITIES: No pedal edema, cyanosis, or clubbing.  NEUROLOGIC:  unable to follow directions for this exam.  PSYCHIATRIC: The patient is alert and oriented x 3.  SKIN: No obvious rash, lesion, or ulcer.    LABORATORY PANEL:   CBC  Recent Labs Lab 08/10/16 0346  WBC 6.8  HGB 10.9*  HCT 31.2*  PLT 172   ------------------------------------------------------------------------------------------------------------------  Chemistries   Recent Labs Lab 08/10/16 0346  NA 138  K 4.1  CL 99*  CO2 30  GLUCOSE 89  BUN 49*  CREATININE 7.76*  CALCIUM 8.6*   ------------------------------------------------------------------------------------------------------------------  Cardiac Enzymes  Recent Labs Lab 08/05/16 0514  TROPONINI 0.34*   ------------------------------------------------------------------------------------------------------------------  RADIOLOGY:  No results found.  EKG:   Orders placed or performed during the hospital encounter of 08/03/16  . EKG 12-Lead  . EKG 12-Lead  . EKG 12-Lead  . EKG 12-Lead    ASSESSMENT AND PLAN:   #1 acute respiratory failure secondary to cardiac arrest with prolonged resuscitation with the ACLS protocol was more than 30 minutes as per the chart:  Continue oxygen Repeat chest x-ray   #2 Acute on chronic diastolic heart failure, severe pulmonary hypertension: on HD.  #3 ESRD on hemodialysis, HD per nephrology   # 4 severe life-threatening hyperkalemia: Improved, Resolved with hemodialysis  #5 history of schizophrenia:  He is on prolixinat home restared,continue Lamictal.  #6 essential hypertension: Her poor control patient's minoxidil has been restarted  #7. RUL DVT   Heparin IV, and Coumadin until INR therapeutic  Due to  kidney issues and being on carbamazepine,  he is not a candidate for new or oral anticoagulant agents, continue warfarin  All the records are reviewed and case discussed with Care Management/Social Workerr. Management plans discussed with the patient, family and they are in agreement.  CODE STATUS: full  TOTAL TIME TAKING CARE OF THIS PATIENT: 32 minutes.   POSSIBLE D/C IN 3-4DAYS, DEPENDING ON CLINICAL CONDITION.   Dustin Flock M.D on 08/11/2016 at 1:23 PM  Between 7am to 6pm - Pager - [phone]  After 6pm go to [email or web address] - password [email or web address] [facility]  Office  [phone]  CC: Primary care physician; Nino Glow McLean-Scocuzza, MD   Note: This dictation was prepared with Dragon dictation along with smaller phrase technology. Any transcriptional errors that result from this process are unintentional.

## 2016-08-11 NOTE — Progress Notes (Signed)
Nutrition Follow-up  DOCUMENTATION CODES:   Not applicable  INTERVENTION:  1. Nepro Shake po BID, each supplement provides 425 kcal and 19 grams protein  NUTRITION DIAGNOSIS:   Inadequate oral intake related to inability to eat as evidenced by NPO status. -ongoing  GOAL:   Patient will meet greater than or equal to 90% of their needs -meeting  MONITOR:   PO intake, Weight trends, Diet advancement, I & O's, Vent status  ASSESSMENT:   56 y.o. Male with PMH of ESRD-on HD, DM type II, HTN presented to the ER s/p cardiac arrest. Pt was intubated with severe HTN, pt was extubated 3/6  Had Eggs, Grits, Toast this morning. 100% PO last 2 meals. No acute c/o Labs and medications reviewed: Colace, Epogen Heparin gtt  Diet Order:  Diet renal with fluid restriction Fluid restriction: 1200 mL Fluid; Room service appropriate? Yes; Fluid consistency: Thin  Skin:  Reviewed, no issues  Last BM:  no date recorded  Height:   Ht Readings from Last 1 Encounters:  08/06/16 6\' 1"  (1.854 m)    Weight:   Wt Readings from Last 1 Encounters:  08/11/16 176 lb 8 oz (80.1 kg)    Ideal Body Weight:  83.6 kg  BMI:  Body mass index is 23.29 kg/m.  Estimated Nutritional Needs:   Kcal:  2200-2500  Protein:  105-115 grams (1.2-1.3 g/kg)  Fluid:  Urine output + 1 L/d  EDUCATION NEEDS:   Education needs no appropriate at this time  Satira Anis. Nakia Koble, MS, RD LDN Inpatient Clinical Dietitian Pager [phone]

## 2016-08-11 NOTE — Clinical Social Work Note (Addendum)
PT worked with patient and are recommending home health, case manager aware.  CSW to sign off, please reconsult if other social work needs arise.  Jones Broom. [place], MSW, [place]  08/11/2016 9:33 AM

## 2016-08-11 NOTE — Progress Notes (Signed)
ANTICOAGULATION CONSULT NOTE - Initial Consult  Pharmacy Consult for Heparin  Indication: VTE prophylaxis  Allergies  Allergen Reactions  . Thorazine [Chlorpromazine] Other (See Comments)    Reaction:  Unknown , pt states it makes him feel real bad    Patient Measurements: Height: 6\' 1"  (185.4 cm) Weight: 176 lb 8 oz (80.1 kg) IBW/kg (Calculated) : 79.9 Heparin Dosing Weight: 85.7 kg   Vital Signs: Temp: 98.6 F (37 C) (03/13 1104) Temp Source: Oral (03/13 1104) BP: 172/75 (03/13 1107) Pulse Rate: 60 (03/13 1107)  Labs:  Recent Labs  08/08/16 1345 08/09/16 0512 08/09/16 1325 08/10/16 0346  HGB 10.9*  --   --  10.9*  HCT 31.2*  --   --  31.2*  PLT 162  --   --  172  LABPROT  --   --  12.9 12.9  INR  --   --  0.97 0.97  HEPARINUNFRC 0.48 0.46  --  0.35  CREATININE  --   --   --  7.76*    Estimated Creatinine Clearance: 12.2 mL/min (by C-G formula based on SCr of 7.76 mg/dL (H)).   Medical History: Past Medical History:  Diagnosis Date  . Anemia   . Diabetes mellitus without complication ([place])   . Dialysis patient [facility])    Mon. -Wed.- Fri  . ESRD (end stage renal disease) ([place])   . Hyperlipidemia   . Malignant hypertension   . Renal artery stenosis ([place])   . Schizophrenia ([place])     Medications:  Prescriptions Prior to Admission  Medication Sig Dispense Refill Last Dose  . amLODipine (NORVASC) 10 MG tablet Take 10 mg by mouth daily.   unknown at unknown  . atenolol (TENORMIN) 25 MG tablet Take 25 mg by mouth 2 (two) times daily.    unknown at unknown  . b complex-vitamin c-folic acid (NEPHRO-VITE) 0.8 MG TABS tablet Take 1 tablet by mouth daily.   unknown at unknown  . calcium acetate (PHOSLO) 667 MG capsule Take 2 capsules (1,334 mg total) by mouth 3 (three) times daily with meals. 60 capsule 1 unknown at unknown  . carbamazepine (TEGRETOL XR) 200 MG 12 hr tablet Take 1,000 mg by mouth every morning.   unknown at unknown  . cholecalciferol (VITAMIN D)  1000 UNITS tablet Take 1,000 Units by mouth daily.   unknown at unknown  . citalopram (CELEXA) 20 MG tablet Take 1 tablet (20 mg total) by mouth daily. 30 tablet 1 unknown at unknown  . cloNIDine (CATAPRES) 0.2 MG tablet Take 1 tablet (0.2 mg total) by mouth 3 (three) times daily. 90 tablet 1 unknown at unknown  . ferrous sulfate 325 (65 FE) MG tablet Take 1 tablet (325 mg total) by mouth daily. 30 tablet 3 unknown at unknown  . fluPHENAZine (PROLIXIN) 2.5 MG tablet Take 2.5 mg by mouth daily.    unknown at unknown  . Fluticasone-Salmeterol (ADVAIR DISKUS) 250-50 MCG/DOSE AEPB Inhale 1 puff into the lungs 2 (two) times daily.   unknown at unknown  . hydrALAZINE (APRESOLINE) 100 MG tablet Take 1 tablet (100 mg total) by mouth every 6 (six) hours. (Patient taking differently: Take 100 mg by mouth 2 (two) times daily. ) 120 tablet 1 unknown at unknown  . minoxidil (LONITEN) 2.5 MG tablet Take 2 tablets (5 mg total) by mouth daily. 60 tablet 1 unknown at unknown  . trihexyphenidyl (ARTANE) 5 MG tablet Take 5 mg by mouth 3 (three) times daily with meals.   unknown  at unknown  . acetaminophen (TYLENOL) 500 MG tablet Take 500 mg by mouth every 4 (four) hours as needed for mild pain. Take 1 tablet every 4-6 hours as needed for pain.   prn at prn  . albuterol (PROAIR HFA) 108 (90 Base) MCG/ACT inhaler Inhale 1-2 puffs into the lungs every 6 (six) hours as needed for wheezing or shortness of breath (cough).   prn at prn  . amoxicillin-clavulanate (AUGMENTIN) 500-125 MG tablet Take 1 tablet (500 mg total) by mouth 3 (three) times daily. 15 tablet 0   . baclofen (LIORESAL) 10 MG tablet Take 1 tablet (10 mg total) by mouth 2 (two) times daily as needed for muscle spasms. 30 each 0 05/25/2016 at Unknown time  . benzonatate (TESSALON) 200 MG capsule Take 1 capsule (200 mg total) by mouth 2 (two) times daily as needed for cough. 20 capsule 0   . calcium acetate (PHOSLO) 667 MG capsule Take 1,334 mg by mouth 3 (three)  times daily with meals.    unknown at unknown  . carbamazepine (TEGRETOL) 200 MG tablet Take 2.5 tablets (500 mg total) by mouth 2 (two) times daily. Taken 2 1/2 tabs (500mg  ) twice a day (Patient not taking: Reported on 04/14/2016) 150 tablet 0 Not Taking  . cyclobenzaprine (FLEXERIL) 5 MG tablet Take 5 mg by mouth at bedtime as needed for muscle spasms.   prn at prn  . fluticasone furoate-vilanterol (BREO ELLIPTA) 100-25 MCG/INH AEPB Inhale 1 puff into the lungs daily.   unknown at unknown  . traMADol (ULTRAM) 50 MG tablet Take 1 tablet (50 mg total) by mouth every 6 (six) hours as needed. 30 tablet 0 unknown at Unknown time    Assessment: Pharmacy consulted to dose heparin in this 56 year old male with VTE.   CrCl = 13.7 ml/min.    Pt received heparin 5000 units SQ on 3/9 @ 0530.   Goal of Therapy:  Heparin level 0.3-0.7 units/ml Monitor platelets by anticoagulation protocol: Yes   Plan:  Give 5000 units bolus x 1 Start heparin infusion at 1500 units/hr Check anti-Xa level in 8 hours and daily while on heparin Continue to monitor H&H and platelets  3/10 AM heparin level 0.63. Recheck in 8 hours to confirm. 3/10 Heparin level resulted @ 0.48. Continue current rate. Recheck heparin level in am   3/11 AM heparin level 0.46. Continue current regimen. Recheck heparin level and CBC with tomorrow AM labs.  3/12 AM heparin level 0.35. Continue current regimen. Recheck heparin level and CBC with tomorrow AM labs.  3/13 - unable to get AM labs from patient. Lab unable to draw from arms, order for draw from foot was ordered, initially patient refused, then he agreed. RN attempted to hold foot down but patient was uncoorpative and unable to get draw. Spoke with Dr. Posey Pronto who stated it was ok to continue heparin drip without labs (HL and CBC). Plan is to get labs with HD tomorrow.  Ramond Dial, Pharm.D, BCPS Clinical Pharmacist  08/11/2016,2:03 PM

## 2016-08-11 NOTE — Progress Notes (Signed)
ANTICOAGULATION CONSULT NOTE - Initial Consult  Pharmacy Consult for warfarin Indication: DVT  Allergies  Allergen Reactions  . Thorazine [Chlorpromazine] Other (See Comments)    Reaction:  Unknown , pt states it makes him feel real bad    Patient Measurements: Height: 6\' 1"  (185.4 cm) Weight: 176 lb 8 oz (80.1 kg) IBW/kg (Calculated) : 79.9  Vital Signs: Temp: 98.6 F (37 C) (03/13 1104) Temp Source: Oral (03/13 1104) BP: 172/75 (03/13 1107) Pulse Rate: 60 (03/13 1107)  Labs:  Recent Labs  08/08/16 1345 08/09/16 0512 08/09/16 1325 08/10/16 0346  HGB 10.9*  --   --  10.9*  HCT 31.2*  --   --  31.2*  PLT 162  --   --  172  LABPROT  --   --  12.9 12.9  INR  --   --  0.97 0.97  HEPARINUNFRC 0.48 0.46  --  0.35  CREATININE  --   --   --  7.76*    Estimated Creatinine Clearance: 12.2 mL/min (by C-G formula based on SCr of 7.76 mg/dL (H)).   Medical History: Past Medical History:  Diagnosis Date  . Anemia   . Diabetes mellitus without complication ([place])   . Dialysis patient [facility])    Mon. -Wed.- Fri  . ESRD (end stage renal disease) ([place])   . Hyperlipidemia   . Malignant hypertension   . Renal artery stenosis ([place])   . Schizophrenia ([place])    Medications:  Prescriptions Prior to Admission  Medication Sig Dispense Refill Last Dose  . amLODipine (NORVASC) 10 MG tablet Take 10 mg by mouth daily.   unknown at unknown  . atenolol (TENORMIN) 25 MG tablet Take 25 mg by mouth 2 (two) times daily.    unknown at unknown  . b complex-vitamin c-folic acid (NEPHRO-VITE) 0.8 MG TABS tablet Take 1 tablet by mouth daily.   unknown at unknown  . calcium acetate (PHOSLO) 667 MG capsule Take 2 capsules (1,334 mg total) by mouth 3 (three) times daily with meals. 60 capsule 1 unknown at unknown  . carbamazepine (TEGRETOL XR) 200 MG 12 hr tablet Take 1,000 mg by mouth every morning.   unknown at unknown  . cholecalciferol (VITAMIN D) 1000 UNITS tablet Take 1,000 Units by mouth  daily.   unknown at unknown  . citalopram (CELEXA) 20 MG tablet Take 1 tablet (20 mg total) by mouth daily. 30 tablet 1 unknown at unknown  . cloNIDine (CATAPRES) 0.2 MG tablet Take 1 tablet (0.2 mg total) by mouth 3 (three) times daily. 90 tablet 1 unknown at unknown  . ferrous sulfate 325 (65 FE) MG tablet Take 1 tablet (325 mg total) by mouth daily. 30 tablet 3 unknown at unknown  . fluPHENAZine (PROLIXIN) 2.5 MG tablet Take 2.5 mg by mouth daily.    unknown at unknown  . Fluticasone-Salmeterol (ADVAIR DISKUS) 250-50 MCG/DOSE AEPB Inhale 1 puff into the lungs 2 (two) times daily.   unknown at unknown  . hydrALAZINE (APRESOLINE) 100 MG tablet Take 1 tablet (100 mg total) by mouth every 6 (six) hours. (Patient taking differently: Take 100 mg by mouth 2 (two) times daily. ) 120 tablet 1 unknown at unknown  . minoxidil (LONITEN) 2.5 MG tablet Take 2 tablets (5 mg total) by mouth daily. 60 tablet 1 unknown at unknown  . trihexyphenidyl (ARTANE) 5 MG tablet Take 5 mg by mouth 3 (three) times daily with meals.   unknown at unknown  . acetaminophen (TYLENOL) 500 MG tablet  Take 500 mg by mouth every 4 (four) hours as needed for mild pain. Take 1 tablet every 4-6 hours as needed for pain.   prn at prn  . albuterol (PROAIR HFA) 108 (90 Base) MCG/ACT inhaler Inhale 1-2 puffs into the lungs every 6 (six) hours as needed for wheezing or shortness of breath (cough).   prn at prn  . amoxicillin-clavulanate (AUGMENTIN) 500-125 MG tablet Take 1 tablet (500 mg total) by mouth 3 (three) times daily. 15 tablet 0   . baclofen (LIORESAL) 10 MG tablet Take 1 tablet (10 mg total) by mouth 2 (two) times daily as needed for muscle spasms. 30 each 0 05/25/2016 at Unknown time  . benzonatate (TESSALON) 200 MG capsule Take 1 capsule (200 mg total) by mouth 2 (two) times daily as needed for cough. 20 capsule 0   . calcium acetate (PHOSLO) 667 MG capsule Take 1,334 mg by mouth 3 (three) times daily with meals.    unknown at  unknown  . carbamazepine (TEGRETOL) 200 MG tablet Take 2.5 tablets (500 mg total) by mouth 2 (two) times daily. Taken 2 1/2 tabs (500mg  ) twice a day (Patient not taking: Reported on 04/14/2016) 150 tablet 0 Not Taking  . cyclobenzaprine (FLEXERIL) 5 MG tablet Take 5 mg by mouth at bedtime as needed for muscle spasms.   prn at prn  . fluticasone furoate-vilanterol (BREO ELLIPTA) 100-25 MCG/INH AEPB Inhale 1 puff into the lungs daily.   unknown at unknown  . traMADol (ULTRAM) 50 MG tablet Take 1 tablet (50 mg total) by mouth every 6 (six) hours as needed. 30 tablet 0 unknown at Unknown time   Scheduled:  . amLODipine  10 mg Oral Daily  . calcium acetate  1,334 mg Oral TID WC  . carbamazepine  500 mg Oral BID  . cloNIDine  0.2 mg Oral TID  . docusate sodium  100 mg Oral Daily  . epoetin (EPOGEN/PROCRIT) injection  4,000 Units Intravenous Q M,W,F-HD  . feeding supplement (NEPRO CARB STEADY)  237 mL Oral BID BM  . fluPHENAZine  2.5 mg Oral Daily  . hydrALAZINE  100 mg Oral Q8H  . Influenza vac split quadrivalent PF  0.5 mL Intramuscular Tomorrow-1000  . insulin aspart  0-15 Units Subcutaneous TID WC  . insulin aspart  0-5 Units Subcutaneous QHS  . minoxidil  5 mg Oral Daily  . pneumococcal 23 valent vaccine  0.5 mL Intramuscular Tomorrow-1000  . warfarin  10 mg Oral ONCE-1800  . Warfarin - Pharmacist Dosing Inpatient   Does not apply q1800   Infusions:  . heparin 1,500 Units/hr (08/11/16 6440)    Assessment: Pharmacy consulted to dose and monitor warfarin in this 56 year old male for treatment of a right upper extremity DVT. Patient also has ESRD on HD and has been anticoagulated with a heparin drip.   Patient not eligible for DOAC due to contraindicated drug interaction with carbamazepine. Patient is being started on warfarin today with heparin bridge.   Goal of Therapy:  INR 2-3 Monitor platelets by anticoagulation protocol: Yes   Plan:  3/11 INR 0.97 Warfarin 7.5mg  3/12 INR 0.97  warfarin 7.5mg    Unable to get AM labs from patient. Lab unable to draw from arms, order for draw from foot was ordered, initially patient refused, then he agreed. RN attempted to hold foot down but patient was uncoorpative and unable to get draw. Spoke with Dr. Posey Pronto who stated it was ok to continue with warfarin and heparin tonight.  Using dosing Nomogram, patient qualified for an initial dose of warfarin 10mg . Patient also on carbamazepine which induces metabolism of warfarin. No movement in INR after one dose of 7.5mg . Per Dr. Posey Pronto, ok to give 10mg  tonight. Plan is to get labs with HD tomorrow.    Ramond Dial, PharmD, BCPS Clinical Pharmacist 08/11/2016,2:06 PM

## 2016-08-11 NOTE — Care Management (Addendum)
Patient does not have a diagnosis that will qualify him for home health physical therapy.  Can provide home health nurse and social work, however. Requested assessment for home 02.  Systolic blood pressure over 200 within the past 24 hours.  He does not have a rolling walker.  heads up referral to [facility] for SN Aide and SW

## 2016-08-11 NOTE — Progress Notes (Signed)
Subjective:  Patient completed hemodialysis yesterday. He appears to be in good spirits today and is interactive.   Objective:  Vital signs in last 24 hours:  Temp:  [97.9 F (36.6 C)-99 F (37.2 C)] 98.6 F (37 C) (03/13 1104) Pulse Rate:  [59-66] 66 (03/13 1411) Resp:  [18] 18 (03/13 1104) BP: (165-233)/(68-101) 185/85 (03/13 1411) SpO2:  [90 %-99 %] 96 % (03/13 1104) Weight:  [79.5 kg (175 lb 3.2 oz)-80.1 kg (176 lb 8 oz)] 80.1 kg (176 lb 8 oz) (03/13 0403)  Weight change: 0 kg (0 lb) Filed Weights   08/10/16 1420 08/10/16 1659 08/11/16 0403  Weight: 77.3 kg (170 lb 6.7 oz) 79.5 kg (175 lb 3.2 oz) 80.1 kg (176 lb 8 oz)    Intake/Output:    Intake/Output Summary (Last 24 hours) at 08/11/16 1512 Last data filed at 08/11/16 1449  Gross per 24 hour  Intake              795 ml  Output              800 ml  Net               -5 ml     Physical Exam: General: Chronically ill appearing  HEENT Anicteric  Neck supple  Pulm/lungs Scattered rhonchi, normal effort   CVS/Heart Regular rhythm  Abdomen:  Soft, nontender, nondistended  Extremities: Trace edema  Neurologic: Awake, alert, following commands   Skin: Dry skin  Access: Left upper arm AV fistula       Basic Metabolic Panel:   Recent Labs Lab 08/05/16 0514 08/06/16 0527 08/07/16 0323 08/08/16 0606 08/10/16 0346  NA 140 135 136 139 138  K 5.4* 5.4* 4.7 3.9 4.1  CL 103 97* 100* 99* 99*  CO2 26 26 28 30 30   GLUCOSE 81 88 102* 81 89  BUN 24* 22* 32* 25* 49*  CREATININE 4.94* 5.07* 6.75* 5.44* 7.76*  CALCIUM 8.5* 9.1 8.5* 8.7* 8.6*  PHOS  --   --   --   --  4.2     CBC:  Recent Labs Lab 08/05/16 0514 08/08/16 1345 08/10/16 0346  WBC 12.1* 7.1 6.8  HGB 11.3* 10.9* 10.9*  HCT 33.2* 31.2* 31.2*  MCV 104.2* 105.1* 106.1*  PLT 146* 162 172      Microbiology:  Recent Results (from the past 720 hour(s))  MRSA PCR Screening     Status: None   Collection Time: 08/03/16  6:09 PM  Result Value  Ref Range Status   MRSA by PCR NEGATIVE NEGATIVE Final    Comment:        The GeneXpert MRSA Assay (FDA approved for NASAL specimens only), is one component of a comprehensive MRSA colonization surveillance program. It is not intended to diagnose MRSA infection nor to guide or monitor treatment for MRSA infections.     Coagulation Studies:  Recent Labs  08/09/16 1325 08/10/16 0346  LABPROT 12.9 12.9  INR 0.97 0.97    Urinalysis: No results for input(s): COLORURINE, LABSPEC, PHURINE, GLUCOSEU, HGBUR, BILIRUBINUR, KETONESUR, PROTEINUR, UROBILINOGEN, NITRITE, LEUKOCYTESUR in the last 72 hours.  Invalid input(s): APPERANCEUR    Imaging: No results found.   Medications:   . heparin 1,500 Units/hr (08/11/16 3009)   . amLODipine  10 mg Oral Daily  . calcium acetate  1,334 mg Oral TID WC  . carbamazepine  500 mg Oral BID  . cloNIDine  0.2 mg Oral TID  . docusate sodium  100  mg Oral Daily  . epoetin (EPOGEN/PROCRIT) injection  4,000 Units Intravenous Q M,W,F-HD  . feeding supplement (NEPRO CARB STEADY)  237 mL Oral BID BM  . fluPHENAZine  2.5 mg Oral Daily  . hydrALAZINE  100 mg Oral Q8H  . Influenza vac split quadrivalent PF  0.5 mL Intramuscular Tomorrow-1000  . insulin aspart  0-15 Units Subcutaneous TID WC  . insulin aspart  0-5 Units Subcutaneous QHS  . minoxidil  5 mg Oral Daily  . pneumococcal 23 valent vaccine  0.5 mL Intramuscular Tomorrow-1000  . warfarin  10 mg Oral ONCE-1800  . Warfarin - Pharmacist Dosing Inpatient   Does not apply q1800   acetaminophen, HYDROcodone-acetaminophen, ipratropium-albuterol, morphine injection, nitroGLYCERIN  Assessment/ Plan:  56 y.o.african American male  With schizophrenia, hypertension, hepatitis C, tobacco abuse, anemia of chronic kidney disease, secondary hyperparathyroidism, end-stage renal disease  [facility] MWF [place] [place].   1.  End-stage renal disease on HD MWF 2.  Severe hyperkalemia, life threatening, had  cardiac arrest with ACLS protocol. 3.  Acute respiratory failure, extubated 3/6 4.  Anemia of chronic kidney disease. Hgb 10.9 5.  RUE DVT, started on anticoagulation.  -patient completed hemodialysis yesterday and tolerated this well.  No acute indication for dialysis today.  His hyperkalemia has been corrected previously with a potassium of 4.1.  In addition his respiratory status appears to be stable off the ventilator.  Blood pressure has been a chronic issue for the patient.  He is currently on amlodipine, clonidine, hydralazine, and now minoxidil.  Continue to monitor blood pressure closely.  We will plan for dialysis again tomorrow.     LOS: 8 Deborh Pense 3/13/20183:12 PM

## 2016-08-12 LAB — CBC
HEMATOCRIT: 31 % — AB (ref 40.0–52.0)
Hemoglobin: 10.8 g/dL — ABNORMAL LOW (ref 13.0–18.0)
MCH: 36.9 pg — AB (ref 26.0–34.0)
MCHC: 34.8 g/dL (ref 32.0–36.0)
MCV: 106 fL — AB (ref 80.0–100.0)
PLATELETS: 198 10*3/uL (ref 150–440)
RBC: 2.92 MIL/uL — ABNORMAL LOW (ref 4.40–5.90)
RDW: 14.8 % — AB (ref 11.5–14.5)
WBC: 6.1 10*3/uL (ref 3.8–10.6)

## 2016-08-12 LAB — BASIC METABOLIC PANEL
Anion gap: 9 (ref 5–15)
BUN: 44 mg/dL — AB (ref 6–20)
CHLORIDE: 96 mmol/L — AB (ref 101–111)
CO2: 29 mmol/L (ref 22–32)
CREATININE: 6.56 mg/dL — AB (ref 0.61–1.24)
Calcium: 9 mg/dL (ref 8.9–10.3)
GFR calc Af Amer: 10 mL/min — ABNORMAL LOW (ref >60)
GFR calc non Af Amer: 9 mL/min — ABNORMAL LOW (ref >60)
GLUCOSE: 81 mg/dL (ref 65–99)
Potassium: 4.7 mmol/L (ref 3.5–5.1)
SODIUM: 134 mmol/L — AB (ref 135–145)

## 2016-08-12 LAB — GLUCOSE, CAPILLARY
GLUCOSE-CAPILLARY: 77 mg/dL (ref 65–99)
Glucose-Capillary: 81 mg/dL (ref 65–99)
Glucose-Capillary: 121 mg/dL — ABNORMAL HIGH (ref 65–99)
Glucose-Capillary: 95 mg/dL (ref 65–99)
Glucose-Capillary: 122 mg/dL — ABNORMAL HIGH (ref 65–99)

## 2016-08-12 LAB — PHOSPHORUS: PHOSPHORUS: 3.4 mg/dL (ref 2.5–4.6)

## 2016-08-12 LAB — PROTIME-INR
INR: 1.59
Prothrombin Time: 19.1 seconds — ABNORMAL HIGH (ref 11.4–15.2)

## 2016-08-12 LAB — HEPARIN LEVEL (UNFRACTIONATED): Heparin Unfractionated: 0.38 IU/mL (ref 0.30–0.70)

## 2016-08-12 MED ORDER — HYDRALAZINE HCL 50 MG PO TABS
100.0000 mg | ORAL_TABLET | Freq: Four times a day (QID) | ORAL | Status: DC
  Administered 2016-08-12 – 2016-08-18 (×21): 100 mg via ORAL
  Filled 2016-08-12 (×25): qty 2

## 2016-08-12 MED ORDER — WARFARIN SODIUM 5 MG PO TABS
7.5000 mg | ORAL_TABLET | Freq: Once | ORAL | Status: AC
  Administered 2016-08-12: 7.5 mg via ORAL
  Filled 2016-08-12: qty 2

## 2016-08-12 NOTE — Progress Notes (Signed)
[facility] representative, Bre left contact information to facility if needed throughout the stay. Crisis Line (someone will always answer) - [phone]. Office - [phone].

## 2016-08-12 NOTE — Progress Notes (Signed)
ANTICOAGULATION CONSULT NOTE - Initial Consult  Pharmacy Consult for warfarin Indication: DVT       Allergies  Allergen Reactions  . Thorazine [Chlorpromazine] Other (See Comments)    Reaction:  Unknown , pt states it makes him feel real bad    Patient Measurements: Height: 6\' 1"  (185.4 cm) Weight: 176 lb 8 oz (80.1 kg) IBW/kg (Calculated) : 79.9  Vital Signs: Temp: 98.6 F (37 C) (03/13 1104) Temp Source: Oral (03/13 1104) BP: 172/75 (03/13 1107) Pulse Rate: 60 (03/13 1107)  Labs:  Recent Labs (last 2 labs)    Recent Labs  08/08/16 1345 08/09/16 0512 08/09/16 1325 08/10/16 0346  HGB 10.9*  --   --  10.9*  HCT 31.2*  --   --  31.2*  PLT 162  --   --  172  LABPROT  --   --  12.9 12.9  INR  --   --  0.97 0.97  HEPARINUNFRC 0.48 0.46  --  0.35  CREATININE  --   --   --  7.76*      Estimated Creatinine Clearance: 12.2 mL/min (by C-G formula based on SCr of 7.76 mg/dL (H)).   Medical History:     Past Medical History:  Diagnosis Date  . Anemia   . Diabetes mellitus without complication ([place])   . Dialysis patient [facility])    Mon. -Wed.- Fri  . ESRD (end stage renal disease) ([place])   . Hyperlipidemia   . Malignant hypertension   . Renal artery stenosis ([place])   . Schizophrenia ([place])    Medications:         Prescriptions Prior to Admission  Medication Sig Dispense Refill Last Dose  . amLODipine (NORVASC) 10 MG tablet Take 10 mg by mouth daily.   unknown at unknown  . atenolol (TENORMIN) 25 MG tablet Take 25 mg by mouth 2 (two) times daily.    unknown at unknown  . b complex-vitamin c-folic acid (NEPHRO-VITE) 0.8 MG TABS tablet Take 1 tablet by mouth daily.   unknown at unknown  . calcium acetate (PHOSLO) 667 MG capsule Take 2 capsules (1,334 mg total) by mouth 3 (three) times daily with meals. 60 capsule 1 unknown at unknown  . carbamazepine (TEGRETOL XR) 200 MG 12 hr tablet Take 1,000 mg by mouth every morning.   unknown at  unknown  . cholecalciferol (VITAMIN D) 1000 UNITS tablet Take 1,000 Units by mouth daily.   unknown at unknown  . citalopram (CELEXA) 20 MG tablet Take 1 tablet (20 mg total) by mouth daily. 30 tablet 1 unknown at unknown  . cloNIDine (CATAPRES) 0.2 MG tablet Take 1 tablet (0.2 mg total) by mouth 3 (three) times daily. 90 tablet 1 unknown at unknown  . ferrous sulfate 325 (65 FE) MG tablet Take 1 tablet (325 mg total) by mouth daily. 30 tablet 3 unknown at unknown  . fluPHENAZine (PROLIXIN) 2.5 MG tablet Take 2.5 mg by mouth daily.    unknown at unknown  . Fluticasone-Salmeterol (ADVAIR DISKUS) 250-50 MCG/DOSE AEPB Inhale 1 puff into the lungs 2 (two) times daily.   unknown at unknown  . hydrALAZINE (APRESOLINE) 100 MG tablet Take 1 tablet (100 mg total) by mouth every 6 (six) hours. (Patient taking differently: Take 100 mg by mouth 2 (two) times daily. ) 120 tablet 1 unknown at unknown  . minoxidil (LONITEN) 2.5 MG tablet Take 2 tablets (5 mg total) by mouth daily. 60 tablet 1 unknown at unknown  . trihexyphenidyl (ARTANE)  5 MG tablet Take 5 mg by mouth 3 (three) times daily with meals.   unknown at unknown  . acetaminophen (TYLENOL) 500 MG tablet Take 500 mg by mouth every 4 (four) hours as needed for mild pain. Take 1 tablet every 4-6 hours as needed for pain.   prn at prn  . albuterol (PROAIR HFA) 108 (90 Base) MCG/ACT inhaler Inhale 1-2 puffs into the lungs every 6 (six) hours as needed for wheezing or shortness of breath (cough).   prn at prn  . amoxicillin-clavulanate (AUGMENTIN) 500-125 MG tablet Take 1 tablet (500 mg total) by mouth 3 (three) times daily. 15 tablet 0   . baclofen (LIORESAL) 10 MG tablet Take 1 tablet (10 mg total) by mouth 2 (two) times daily as needed for muscle spasms. 30 each 0 05/25/2016 at Unknown time  . benzonatate (TESSALON) 200 MG capsule Take 1 capsule (200 mg total) by mouth 2 (two) times daily as needed for cough. 20 capsule 0   . calcium acetate  (PHOSLO) 667 MG capsule Take 1,334 mg by mouth 3 (three) times daily with meals.    unknown at unknown  . carbamazepine (TEGRETOL) 200 MG tablet Take 2.5 tablets (500 mg total) by mouth 2 (two) times daily. Taken 2 1/2 tabs (500mg  ) twice a day (Patient not taking: Reported on 04/14/2016) 150 tablet 0 Not Taking  . cyclobenzaprine (FLEXERIL) 5 MG tablet Take 5 mg by mouth at bedtime as needed for muscle spasms.   prn at prn  . fluticasone furoate-vilanterol (BREO ELLIPTA) 100-25 MCG/INH AEPB Inhale 1 puff into the lungs daily.   unknown at unknown  . traMADol (ULTRAM) 50 MG tablet Take 1 tablet (50 mg total) by mouth every 6 (six) hours as needed. 30 tablet 0 unknown at Unknown time   Scheduled:  . amLODipine  10 mg Oral Daily  . calcium acetate  1,334 mg Oral TID WC  . carbamazepine  500 mg Oral BID  . cloNIDine  0.2 mg Oral TID  . docusate sodium  100 mg Oral Daily  . epoetin (EPOGEN/PROCRIT) injection  4,000 Units Intravenous Q M,W,F-HD  . feeding supplement (NEPRO CARB STEADY)  237 mL Oral BID BM  . fluPHENAZine  2.5 mg Oral Daily  . hydrALAZINE  100 mg Oral Q8H  . Influenza vac split quadrivalent PF  0.5 mL Intramuscular Tomorrow-1000  . insulin aspart  0-15 Units Subcutaneous TID WC  . insulin aspart  0-5 Units Subcutaneous QHS  . minoxidil  5 mg Oral Daily  . pneumococcal 23 valent vaccine  0.5 mL Intramuscular Tomorrow-1000  . warfarin  10 mg Oral ONCE-1800  . Warfarin - Pharmacist Dosing Inpatient   Does not apply q1800   Infusions:  . heparin 1,500 Units/hr (08/11/16 1740)    Assessment: Pharmacy consulted to dose and monitor warfarin in this 56 year old male for treatment of a right upper extremity DVT. Patient also has ESRD on HD and has been anticoagulated with a heparin drip.   Patient not eligible for DOAC due to contraindicated drug interaction with carbamazepine. Patient is being started on warfarin today with heparin bridge.   Goal of Therapy:  INR  2-3 Monitor platelets by anticoagulation protocol: Yes   Plan:  3/11 INR 0.97 Warfarin 7.5mg  3/12 INR 0.97 warfarin 7.5mg  3/13     Warfarin 10 mg 3/14 INR 1.59  Warfarin 7.5 mg   Unable to get AM labs from patient. Lab unable to draw from arms, order for draw  from foot was ordered, initially patient refused, then he agreed. RN attempted to hold foot down but patient was uncoorpative and unable to get draw. Spoke with Dr. Posey Pronto who stated it was ok to continue with warfarin and heparin tonight.  Using dosing Nomogram, patient qualified for an initial dose of warfarin 10mg . Patient also on carbamazepine which induces metabolism of warfarin. No movement in INR after one dose of 7.5mg . Per Dr. Posey Pronto, ok to give 10mg  tonight. Plan is to get labs with HD tomorrow.   3/14 INR climbing up but still subtherapeutic, will go back to warfarin 7.5 mg dose and will recheck INR w/ am labs.  Thank you for this consult.  Tobie Lords, PharmD, BCPS Clinical Pharmacist 08/12/2016

## 2016-08-12 NOTE — Progress Notes (Signed)
Upon assessment, patient was sweating profusely while eating dinner. Patient had no complaints. FSBS 95, oral temp 98.8. Will notify night shift Rn.

## 2016-08-12 NOTE — Progress Notes (Signed)
Pre HD  

## 2016-08-12 NOTE — Care Management (Signed)
Patient's PSI (Psychotherapeutic Service) representative is at bedside.  Patient is happy to see her. Contact information for agency (Crisis Line (someone will always answer) - [phone]. Office - [phone].  Patient is adamant that he is not going to a family care home/faciltiy. Informed Bre of the home health services that are being set up for patient.

## 2016-08-12 NOTE — Progress Notes (Signed)
HD initiated without issue via L AVF. No heparin tx.

## 2016-08-12 NOTE — Progress Notes (Signed)
HD completed without issue. 3.5L goal met as ordered. Patient has no complaints.

## 2016-08-12 NOTE — Progress Notes (Signed)
Subjective:  Patient seen during dialysis. Tolerating well. Ultrafiltration target 3.5 kg today.   Objective:  Vital signs in last 24 hours:  Temp:  [98 F (36.7 C)-98.7 F (37.1 C)] 98 F (36.7 C) (03/14 0954) Pulse Rate:  [56-71] 56 (03/14 1100) Resp:  [11-19] 11 (03/14 1100) BP: (157-206)/(68-96) 161/72 (03/14 1100) SpO2:  [91 %-100 %] 99 % (03/14 1100) Weight:  [77.3 kg (170 lb 8 oz)-77.4 kg (170 lb 10.2 oz)] 77.4 kg (170 lb 10.2 oz) (03/14 0954)  Weight change: -1.86 kg (-4 lb 1.6 oz) Filed Weights   08/11/16 0403 08/12/16 0408 08/12/16 0954  Weight: 80.1 kg (176 lb 8 oz) 77.3 kg (170 lb 8 oz) 77.4 kg (170 lb 10.2 oz)    Intake/Output:    Intake/Output Summary (Last 24 hours) at 08/12/16 1132 Last data filed at 08/12/16 0947  Gross per 24 hour  Intake              765 ml  Output              700 ml  Net               65 ml     Physical Exam: General: Chronically ill appearing  HEENT Anicteric  Neck supple  Pulm/lungs Scattered rhonchi, normal effort   CVS/Heart Regular rhythm  Abdomen:  Soft, nontender, nondistended  Extremities: Trace edema  Neurologic: Awake, alert, following commands   Skin: Dry skin  Access: Left upper arm AV fistula       Basic Metabolic Panel:   Recent Labs Lab 08/06/16 0527 08/07/16 0323 08/08/16 0606 08/10/16 0346 08/12/16 0404 08/12/16 1000  NA 135 136 139 138 134*  --   K 5.4* 4.7 3.9 4.1 4.7  --   CL 97* 100* 99* 99* 96*  --   CO2 26 28 30 30 29   --   GLUCOSE 88 102* 81 89 81  --   BUN 22* 32* 25* 49* 44*  --   CREATININE 5.07* 6.75* 5.44* 7.76* 6.56*  --   CALCIUM 9.1 8.5* 8.7* 8.6* 9.0  --   PHOS  --   --   --  4.2  --  3.4     CBC:  Recent Labs Lab 08/08/16 1345 08/10/16 0346 08/12/16 0404  WBC 7.1 6.8 6.1  HGB 10.9* 10.9* 10.8*  HCT 31.2* 31.2* 31.0*  MCV 105.1* 106.1* 106.0*  PLT 162 172 198      Microbiology:  Recent Results (from the past 720 hour(s))  MRSA PCR Screening     Status:  None   Collection Time: 08/03/16  6:09 PM  Result Value Ref Range Status   MRSA by PCR NEGATIVE NEGATIVE Final    Comment:        The GeneXpert MRSA Assay (FDA approved for NASAL specimens only), is one component of a comprehensive MRSA colonization surveillance program. It is not intended to diagnose MRSA infection nor to guide or monitor treatment for MRSA infections.     Coagulation Studies:  Recent Labs  08/09/16 1325 08/10/16 0346 08/12/16 0404  LABPROT 12.9 12.9 19.1*  INR 0.97 0.97 1.59    Urinalysis: No results for input(s): COLORURINE, LABSPEC, PHURINE, GLUCOSEU, HGBUR, BILIRUBINUR, KETONESUR, PROTEINUR, UROBILINOGEN, NITRITE, LEUKOCYTESUR in the last 72 hours.  Invalid input(s): APPERANCEUR    Imaging: No results found.   Medications:   . heparin 1,500 Units/hr (08/12/16 0255)   . amLODipine  10 mg Oral Daily  . calcium  acetate  1,334 mg Oral TID WC  . carbamazepine  500 mg Oral BID  . cloNIDine  0.2 mg Oral TID  . docusate sodium  100 mg Oral Daily  . epoetin (EPOGEN/PROCRIT) injection  4,000 Units Intravenous Q M,W,F-HD  . feeding supplement (NEPRO CARB STEADY)  237 mL Oral BID BM  . fluPHENAZine  2.5 mg Oral Daily  . hydrALAZINE  100 mg Oral Q8H  . Influenza vac split quadrivalent PF  0.5 mL Intramuscular Tomorrow-1000  . insulin aspart  0-15 Units Subcutaneous TID WC  . insulin aspart  0-5 Units Subcutaneous QHS  . minoxidil  5 mg Oral Daily  . pneumococcal 23 valent vaccine  0.5 mL Intramuscular Tomorrow-1000  . warfarin  7.5 mg Oral ONCE-1800  . Warfarin - Pharmacist Dosing Inpatient   Does not apply q1800   acetaminophen, HYDROcodone-acetaminophen, ipratropium-albuterol, morphine injection, nitroGLYCERIN  Assessment/ Plan:  56 y.o.african American male  With schizophrenia, hypertension, hepatitis C, tobacco abuse, anemia of chronic kidney disease, secondary hyperparathyroidism, end-stage renal disease  [facility] MWF [place].    1.  End-stage renal disease on HD MWF 2.  Severe hyperkalemia, life threatening, had cardiac arrest with ACLS protocol. 3.  Acute respiratory failure, extubated 3/6 4.  Anemia of chronic kidney disease. Hgb 10.9 5.  RUE DVT, started on anticoagulation.  -  Patient seen and evaluated during hemodialysis. Ultrafiltration target today is 3.5 kg.  Hyperkalemia has been corrected. Potassium today was 4.7. His hyperkalemia is likely dietary in nature. Doing well from a respiratory status at the moment. Hemoglobin also stable at 10. Otherwise placement plans as per hospitalist.     LOS: 9 Celisa Schoenberg 3/14/201811:32 AM

## 2016-08-12 NOTE — Progress Notes (Signed)
[facility] [place] NAME: Jaamal Farooqui    MR#:  [id number]  DATE OF BIRTH:  1961-03-13  CHIEF COMPLAINT:   Chief Complaint  Patient presents with  . Weakness   denies any complaints      REVIEW OF SYSTEMS:   Review of Systems  Constitutional: Negative for chills and fever.  HENT: Negative for hearing loss.   Eyes: Negative for blurred vision, double vision and photophobia.  Respiratory: Positive for cough. Negative for hemoptysis and shortness of breath.   Cardiovascular: Negative for palpitations, orthopnea and leg swelling.  Gastrointestinal: Negative for abdominal pain, diarrhea and vomiting.  Genitourinary: Negative for dysuria and urgency.  Musculoskeletal: Negative for myalgias and neck pain.  Skin: Negative for rash.  Neurological: Negative for dizziness, focal weakness, seizures, weakness and headaches.  Psychiatric/Behavioral: Negative for memory loss. The patient does not have insomnia.    is very slow to answer but answers appropriately.  DRUG ALLERGIES:   Allergies  Allergen Reactions  . Thorazine [Chlorpromazine] Other (See Comments)    Reaction:  Unknown , pt states it makes him feel real bad    VITALS:  Blood pressure (!) 156/73, pulse 64, temperature 98 F (36.7 C), temperature source Oral, resp. rate 13, height 6\' 1"  (1.854 m), weight 170 lb 10.2 oz (77.4 kg), SpO2 99 %.  PHYSICAL EXAMINATION:  GENERAL:  56 y.o.-year-old patient lying in the bed with no acute distress.Overall ill-appearing maleEYES: Pupils equal, round, reactive to light and . No scleral icterus. Extraocular muscles intact.  HEENT: Head atraumatic, normocephalic. Oropharynx and nasopharynx clear.  NECK:  Supple, no jugular venous distention. No thyroid enlargement, no tenderness.  LUNGS: Normal breath sounds bilaterally, no wheezing, rales,rhonchi or crepitation. No use of accessory muscles of respiration.  CARDIOVASCULAR: S1, S2  normal. No murmurs, rubs, or gallops.  ABDOMEN: Soft, nontender, nondistended. Bowel sounds present. No organomegaly or mass.  EXTREMITIES: No pedal edema, cyanosis, or clubbing.  NEUROLOGIC:  unable to follow directions for this exam.  PSYCHIATRIC: The patient is alert and oriented x 3.  SKIN: No obvious rash, lesion, or ulcer.    LABORATORY PANEL:   CBC  Recent Labs Lab 08/12/16 0404  WBC 6.1  HGB 10.8*  HCT 31.0*  PLT 198   ------------------------------------------------------------------------------------------------------------------  Chemistries   Recent Labs Lab 08/12/16 0404  NA 134*  K 4.7  CL 96*  CO2 29  GLUCOSE 81  BUN 44*  CREATININE 6.56*  CALCIUM 9.0   ------------------------------------------------------------------------------------------------------------------  Cardiac Enzymes No results for input(s): TROPONINI in the last 168 hours. ------------------------------------------------------------------------------------------------------------------  RADIOLOGY:  No results found.  EKG:   Orders placed or performed during the hospital encounter of 08/03/16  . EKG 12-Lead  . EKG 12-Lead  . EKG 12-Lead  . EKG 12-Lead    ASSESSMENT AND PLAN:   #1 acute respiratory failure secondary to cardiac arrest with prolonged resuscitation with the ACLS protocol   Continue oxygenAnd wean if possible  #2 Acute on chronic diastolic heart failure, severe pulmonary hypertension: on HD.  #3 ESRD on hemodialysis, HD per nephrology   # 4 severe life-threatening hyperkalemia:, Resolved with hemodialysis  #5 history of schizophrenia:  He is on prolixinat home restared,continue Lamictal.  #6 essential hypertension: Her poor control patient's minoxidil has been restarted, increased hydralazine dose  #7. RUL DVT INR trending up continue IV heparin and Coumadin     All the records are reviewed and case discussed with Care Management/Social  Workerr. Management plans discussed with the patient, family and they are in agreement.  CODE STATUS: full  TOTAL TIME TAKING CARE OF THIS PATIENT 73min.   POSSIBLE D/C IN 3-4DAYS, DEPENDING ON CLINICAL CONDITION.   Dustin Flock M.D on 08/12/2016 at 1:26 PM  Between 7am to 6pm - Pager - [phone]  After 6pm go to [email or web address] - password [email or web address] [facility]  Office  [phone]  CC: Primary care physician; Nino Glow McLean-Scocuzza, MD   Note: This dictation was prepared with Dragon dictation along with smaller phrase technology. Any transcriptional errors that result from this process are unintentional.

## 2016-08-12 NOTE — Progress Notes (Signed)
Post HD assessment unchanged  

## 2016-08-12 NOTE — Progress Notes (Signed)
Physical Therapy Treatment Patient Details Name: Todd Brown MRN: [id number] DOB: 1961-03-22 Today's Date: 08/12/2016    History of Present Illness Pt is a 56 y.o. male presenting to hospital with generalized weakness and 1 fall.  Pt with cardiac arrest in ED (30 minutes ACLS before ROSC) and received emergent HD.  ETT placed 08/03/16 and extubated 08/04/16.  Pt admitted with cardiac arrest d/t hyperkalemia and acute respiratory failure.  Pt found to have acute R occlusive cephalic and basilic vein thrombosis and heparin drip started 08/07/16.  PMH includes anemia, DM, HD MWF, schizophrenia, malgnant hypertension, Hep C, ESRD.    PT Comments    Pt is making good progress towards goals with improved ambulation distance this session. All mobility performed with RW and 2L of O2 sats WNL. No fatigue present. Pt with slight unsteadiness during ambulation. +2 required for lines/leads. Good endurance with seated there-ex and pt able to participate in therapy despite being sleepy. Pt left in recliner with RN in room.   Follow Up Recommendations  Home health PT;Supervision/Assistance - 24 hour     Equipment Recommendations  Rolling walker with 5" wheels    Recommendations for Other Services       Precautions / Restrictions Precautions Precautions: Fall Restrictions Weight Bearing Restrictions: No    Mobility  Bed Mobility Overal bed mobility: Needs Assistance Bed Mobility: Supine to Sit     Supine to sit: Supervision;HOB elevated     General bed mobility comments: SBA for lines  Transfers Overall transfer level: Needs assistance Equipment used: Rolling walker (2 wheeled) Transfers: Sit to/from Stand Sit to Stand: Min guard;+2 safety/equipment         General transfer comment: cues for pushing from seated surface. Safe technique performed with upright posture. +2 for lines including O2. All mobility performed on 2L of O2.  Ambulation/Gait Ambulation/Gait assistance: Min  guard Ambulation Distance (Feet): 200 Feet Assistive device: Rolling walker (2 wheeled) Gait Pattern/deviations: Step-through pattern     General Gait Details: slight unsteadiness initially, however pt improves with increased distance. Reciprocal gait pattern noted. No fatigue reported. O2 sats at 93% post mobility   Stairs            Wheelchair Mobility    Modified Rankin (Stroke Patients Only)       Balance                                    Cognition Arousal/Alertness: Awake/alert Behavior During Therapy: WFL for tasks assessed/performed Overall Cognitive Status: History of cognitive impairments - at baseline                      Exercises Other Exercises Other Exercises: Pt performed seated ther-ex including LAQ, hip add squeezes, hip abd/add, SLRs, and alt. marching. ALl ther-ex performed x 12 reps with cga and safe technique.    General Comments        Pertinent Vitals/Pain Pain Assessment: No/denies pain    Home Living                      Prior Function            PT Goals (current goals can now be found in the care plan section) Acute Rehab PT Goals Patient Stated Goal: to sleep PT Goal Formulation: With patient Time For Goal Achievement: 08/23/16 Potential to Achieve Goals: Good Progress towards  PT goals: Progressing toward goals    Frequency    Min 2X/week      PT Plan Current plan remains appropriate    Co-evaluation             End of Session Equipment Utilized During Treatment: Gait belt Activity Tolerance: Patient tolerated treatment well Patient left: in chair;with chair alarm set Nurse Communication: Mobility status PT Visit Diagnosis: Unsteadiness on feet (R26.81);Other abnormalities of gait and mobility (R26.89);History of falling (Z91.81);Muscle weakness (generalized) (M62.81)     Time: 2831-5176 PT Time Calculation (min) (ACUTE ONLY): 25 min  Charges:  $Gait Training: 8-22  mins $Therapeutic Exercise: 8-22 mins                    G Codes:       Todd Brown August 16, 2016, 11:34 AM Todd Brown, PT, DPT [phone]

## 2016-08-12 NOTE — Progress Notes (Signed)
ANTICOAGULATION CONSULT NOTE - Initial Consult  Pharmacy Consult for Heparin  Indication: VTE prophylaxis       Allergies  Allergen Reactions  . Thorazine [Chlorpromazine] Other (See Comments)    Reaction:  Unknown , pt states it makes him feel real bad    Patient Measurements: Height: 6\' 1"  (185.4 cm) Weight: 176 lb 8 oz (80.1 kg) IBW/kg (Calculated) : 79.9 Heparin Dosing Weight: 85.7 kg   Vital Signs: Temp: 98.6 F (37 C) (03/13 1104) Temp Source: Oral (03/13 1104) BP: 172/75 (03/13 1107) Pulse Rate: 60 (03/13 1107)  Labs:  Recent Labs (last 2 labs)    Recent Labs  08/08/16 1345 08/09/16 0512 08/09/16 1325 08/10/16 0346  HGB 10.9*  --   --  10.9*  HCT 31.2*  --   --  31.2*  PLT 162  --   --  172  LABPROT  --   --  12.9 12.9  INR  --   --  0.97 0.97  HEPARINUNFRC 0.48 0.46  --  0.35  CREATININE  --   --   --  7.76*      Estimated Creatinine Clearance: 12.2 mL/min (by C-G formula based on SCr of 7.76 mg/dL (H)).   Medical History:     Past Medical History:  Diagnosis Date  . Anemia   . Diabetes mellitus without complication ([place])   . Dialysis patient [name])    Mon. -Wed.- Fri  . ESRD (end stage renal disease) ([place])   . Hyperlipidemia   . Malignant hypertension   . Renal artery stenosis ([place])   . Schizophrenia ([place])     Medications:         Prescriptions Prior to Admission  Medication Sig Dispense Refill Last Dose  . amLODipine (NORVASC) 10 MG tablet Take 10 mg by mouth daily.   unknown at unknown  . atenolol (TENORMIN) 25 MG tablet Take 25 mg by mouth 2 (two) times daily.    unknown at unknown  . b complex-vitamin c-folic acid (NEPHRO-VITE) 0.8 MG TABS tablet Take 1 tablet by mouth daily.   unknown at unknown  . calcium acetate (PHOSLO) 667 MG capsule Take 2 capsules (1,334 mg total) by mouth 3 (three) times daily with meals. 60 capsule 1 unknown at unknown  . carbamazepine (TEGRETOL XR) 200 MG 12 hr tablet Take  1,000 mg by mouth every morning.   unknown at unknown  . cholecalciferol (VITAMIN D) 1000 UNITS tablet Take 1,000 Units by mouth daily.   unknown at unknown  . citalopram (CELEXA) 20 MG tablet Take 1 tablet (20 mg total) by mouth daily. 30 tablet 1 unknown at unknown  . cloNIDine (CATAPRES) 0.2 MG tablet Take 1 tablet (0.2 mg total) by mouth 3 (three) times daily. 90 tablet 1 unknown at unknown  . ferrous sulfate 325 (65 FE) MG tablet Take 1 tablet (325 mg total) by mouth daily. 30 tablet 3 unknown at unknown  . fluPHENAZine (PROLIXIN) 2.5 MG tablet Take 2.5 mg by mouth daily.    unknown at unknown  . Fluticasone-Salmeterol (ADVAIR DISKUS) 250-50 MCG/DOSE AEPB Inhale 1 puff into the lungs 2 (two) times daily.   unknown at unknown  . hydrALAZINE (APRESOLINE) 100 MG tablet Take 1 tablet (100 mg total) by mouth every 6 (six) hours. (Patient taking differently: Take 100 mg by mouth 2 (two) times daily. ) 120 tablet 1 unknown at unknown  . minoxidil (LONITEN) 2.5 MG tablet Take 2 tablets (5 mg total) by mouth daily. [place]  tablet 1 unknown at unknown  . trihexyphenidyl (ARTANE) 5 MG tablet Take 5 mg by mouth 3 (three) times daily with meals.   unknown at unknown  . acetaminophen (TYLENOL) 500 MG tablet Take 500 mg by mouth every 4 (four) hours as needed for mild pain. Take 1 tablet every 4-6 hours as needed for pain.   prn at prn  . albuterol (PROAIR HFA) 108 (90 Base) MCG/ACT inhaler Inhale 1-2 puffs into the lungs every 6 (six) hours as needed for wheezing or shortness of breath (cough).   prn at prn  . amoxicillin-clavulanate (AUGMENTIN) 500-125 MG tablet Take 1 tablet (500 mg total) by mouth 3 (three) times daily. 15 tablet 0   . baclofen (LIORESAL) 10 MG tablet Take 1 tablet (10 mg total) by mouth 2 (two) times daily as needed for muscle spasms. 30 each 0 05/25/2016 at Unknown time  . benzonatate (TESSALON) 200 MG capsule Take 1 capsule (200 mg total) by mouth 2 (two) times daily as needed  for cough. 20 capsule 0   . calcium acetate (PHOSLO) 667 MG capsule Take 1,334 mg by mouth 3 (three) times daily with meals.    unknown at unknown  . carbamazepine (TEGRETOL) 200 MG tablet Take 2.5 tablets (500 mg total) by mouth 2 (two) times daily. Taken 2 1/2 tabs (500mg  ) twice a day (Patient not taking: Reported on 04/14/2016) 150 tablet 0 Not Taking  . cyclobenzaprine (FLEXERIL) 5 MG tablet Take 5 mg by mouth at bedtime as needed for muscle spasms.   prn at prn  . fluticasone furoate-vilanterol (BREO ELLIPTA) 100-25 MCG/INH AEPB Inhale 1 puff into the lungs daily.   unknown at unknown  . traMADol (ULTRAM) 50 MG tablet Take 1 tablet (50 mg total) by mouth every 6 (six) hours as needed. 30 tablet 0 unknown at Unknown time    Assessment: Pharmacy consulted to dose heparin in this 56 year old male with VTE.   CrCl = 13.7 ml/min.    Pt received heparin 5000 units SQ on 3/9 @ 0530.   Goal of Therapy:  Heparin level 0.3-0.7 units/ml Monitor platelets by anticoagulation protocol: Yes   Plan:  Give 5000 units bolus x 1 Start heparin infusion at 1500 units/hr Check anti-Xa level in 8 hours and daily while on heparin Continue to monitor H&H and platelets  3/10 AM heparin level 0.63. Recheck in 8 hours to confirm. 3/10 Heparin level resulted @ 0.48. Continue current rate. Recheck heparin level in am   3/11 AM heparin level 0.46. Continue current regimen. Recheck heparin level and CBC with tomorrow AM labs.  3/12 AM heparin level 0.35. Continue current regimen. Recheck heparin level and CBC with tomorrow AM labs.  3/13 - unable to get AM labs from patient. Lab unable to draw from arms, order for draw from foot was ordered, initially patient refused, then he agreed. RN attempted to hold foot down but patient was uncoorpative and unable to get draw. Spoke with Dr. Posey Pronto who stated it was ok to continue heparin drip without labs (HL and CBC). Plan is to get labs with HD  tomorrow.  3/14 HL @ 0400 0.38. Will continue current rate and will recheck HL 3/15 @ 0400. Will continue to monitor CBC.  Thank you for this consult.  Tobie Lords, PharmD, BCPS Clinical Pharmacist 08/12/2016

## 2016-08-12 NOTE — Plan of Care (Signed)
Problem: Activity: Goal: Risk for activity intolerance will decrease Outcome: Progressing Patient ambulated in room and in hallway with physical therapy, tolerated well. No complaints with exertion. Patient now resting comfortably in the chair.

## 2016-08-13 ENCOUNTER — Inpatient Hospital Stay: Payer: Medicaid Other

## 2016-08-13 LAB — RENAL FUNCTION PANEL
ANION GAP: 10 (ref 5–15)
Albumin: 3.1 g/dL — ABNORMAL LOW (ref 3.5–5.0)
BUN: 37 mg/dL — ABNORMAL HIGH (ref 6–20)
CALCIUM: 9.3 mg/dL (ref 8.9–10.3)
CO2: 28 mmol/L (ref 22–32)
Chloride: 95 mmol/L — ABNORMAL LOW (ref 101–111)
Creatinine, Ser: 4.72 mg/dL — ABNORMAL HIGH (ref 0.61–1.24)
GFR calc non Af Amer: 13 mL/min — ABNORMAL LOW (ref >60)
GFR, EST AFRICAN AMERICAN: 15 mL/min — AB (ref >60)
GLUCOSE: 94 mg/dL (ref 65–99)
Phosphorus: 3 mg/dL (ref 2.5–4.6)
Potassium: 4.4 mmol/L (ref 3.5–5.1)
SODIUM: 133 mmol/L — AB (ref 135–145)

## 2016-08-13 LAB — GLUCOSE, CAPILLARY
GLUCOSE-CAPILLARY: 314 mg/dL — AB (ref 65–99)
GLUCOSE-CAPILLARY: 116 mg/dL — AB (ref 65–99)
Glucose-Capillary: 71 mg/dL (ref 65–99)
Glucose-Capillary: 282 mg/dL — ABNORMAL HIGH (ref 65–99)
Glucose-Capillary: 57 mg/dL — ABNORMAL LOW (ref 65–99)

## 2016-08-13 LAB — HEPARIN LEVEL (UNFRACTIONATED)
HEPARIN UNFRACTIONATED: 0.1 [IU]/mL — AB (ref 0.30–0.70)
HEPARIN UNFRACTIONATED: 0.62 [IU]/mL (ref 0.30–0.70)
HEPARIN UNFRACTIONATED: 0.6 [IU]/mL (ref 0.30–0.70)

## 2016-08-13 LAB — PROTIME-INR
INR: 1.81
Prothrombin Time: 21.2 seconds — ABNORMAL HIGH (ref 11.4–15.2)

## 2016-08-13 MED ORDER — MINOXIDIL 2.5 MG PO TABS
10.0000 mg | ORAL_TABLET | Freq: Every day | ORAL | Status: DC
  Administered 2016-08-14 – 2016-08-18 (×5): 10 mg via ORAL
  Filled 2016-08-13 (×5): qty 4
  Filled 2016-08-13: qty 1

## 2016-08-13 MED ORDER — WARFARIN SODIUM 5 MG PO TABS
7.5000 mg | ORAL_TABLET | Freq: Once | ORAL | Status: AC
  Administered 2016-08-13: 7.5 mg via ORAL
  Filled 2016-08-13: qty 2

## 2016-08-13 MED ORDER — HEPARIN BOLUS VIA INFUSION
2350.0000 [IU] | Freq: Once | INTRAVENOUS | Status: AC
  Administered 2016-08-13: 2350 [IU] via INTRAVENOUS
  Filled 2016-08-13: qty 2350

## 2016-08-13 MED ORDER — HYDRALAZINE HCL 20 MG/ML IJ SOLN
10.0000 mg | INTRAMUSCULAR | Status: DC | PRN
  Administered 2016-08-13 – 2016-08-16 (×3): 10 mg via INTRAVENOUS
  Filled 2016-08-13 (×3): qty 1

## 2016-08-13 NOTE — Progress Notes (Addendum)
ANTICOAGULATION CONSULT NOTE - Initial Consult  Pharmacy Consult for Heparin  Indication: VTE prophylaxis       Allergies  Allergen Reactions  . Thorazine [Chlorpromazine] Other (See Comments)    Reaction: Unknown , pt states it makes him feel real bad    Patient Measurements: Height: 6\' 1"  (185.4 cm) Weight: 176 lb 8 oz (80.1 kg) IBW/kg (Calculated) : 79.9 Heparin Dosing Weight: 85.7 kg   Vital Signs: Temp: 98.6 F (37 C) (03/13 1104) Temp Source: Oral (03/13 1104) BP: 172/75 (03/13 1107) Pulse Rate: 60 (03/13 1107)  Labs:  Recent Labs (last 2 labs)    Recent Labs  08/08/16 1345 08/09/16 0512 08/09/16 1325 08/10/16 0346  HGB 10.9* --  --  10.9*  HCT 31.2* --  --  31.2*  PLT 162 --  --  172  LABPROT --  --  12.9 12.9  INR --  --  0.97 0.97  HEPARINUNFRC 0.48 0.46 --  0.35  CREATININE --  --  --  7.76*      Estimated Creatinine Clearance: 12.2 mL/min (by C-G formula based on SCr of 7.76 mg/dL (H)).   Medical History:     Past Medical History:  Diagnosis Date  . Anemia   . Diabetes mellitus without complication ([place])   . Dialysis patient [facility])    Mon. -Wed.- Fri  . ESRD (end stage renal disease) ([place])   . Hyperlipidemia   . Malignant hypertension   . Renal artery stenosis ([place])   . Schizophrenia ([place])     Medications:         Prescriptions Prior to Admission  Medication Sig Dispense Refill Last Dose  . amLODipine (NORVASC) 10 MG tablet Take 10 mg by mouth daily.   unknown at unknown  . atenolol (TENORMIN) 25 MG tablet Take 25 mg by mouth 2 (two) times daily.    unknown at unknown  . b complex-vitamin c-folic acid (NEPHRO-VITE) 0.8 MG TABS tablet Take 1 tablet by mouth daily.   unknown at unknown  . calcium acetate (PHOSLO) 667 MG capsule Take 2 capsules (1,334 mg total) by mouth 3 (three) times daily with meals. 60 capsule 1 unknown at unknown  . carbamazepine (TEGRETOL XR) 200 MG 12 hr  tablet Take 1,000 mg by mouth every morning.   unknown at unknown  . cholecalciferol (VITAMIN D) 1000 UNITS tablet Take 1,000 Units by mouth daily.   unknown at unknown  . citalopram (CELEXA) 20 MG tablet Take 1 tablet (20 mg total) by mouth daily. 30 tablet 1 unknown at unknown  . cloNIDine (CATAPRES) 0.2 MG tablet Take 1 tablet (0.2 mg total) by mouth 3 (three) times daily. 90 tablet 1 unknown at unknown  . ferrous sulfate 325 (65 FE) MG tablet Take 1 tablet (325 mg total) by mouth daily. 30 tablet 3 unknown at unknown  . fluPHENAZine (PROLIXIN) 2.5 MG tablet Take 2.5 mg by mouth daily.    unknown at unknown  . Fluticasone-Salmeterol (ADVAIR DISKUS) 250-50 MCG/DOSE AEPB Inhale 1 puff into the lungs 2 (two) times daily.   unknown at unknown  . hydrALAZINE (APRESOLINE) 100 MG tablet Take 1 tablet (100 mg total) by mouth every 6 (six) hours. (Patient taking differently: Take 100 mg by mouth 2 (two) times daily. ) 120 tablet 1 unknown at unknown  . minoxidil (LONITEN) 2.5 MG tablet Take 2 tablets (5 mg total) by mouth daily. 60 tablet 1 unknown at unknown  . trihexyphenidyl (ARTANE) 5 MG tablet Take 5 mg  by mouth 3 (three) times daily with meals.   unknown at unknown  . acetaminophen (TYLENOL) 500 MG tablet Take 500 mg by mouth every 4 (four) hours as needed for mild pain. Take 1 tablet every 4-6 hours as needed for pain.   prn at prn  . albuterol (PROAIR HFA) 108 (90 Base) MCG/ACT inhaler Inhale 1-2 puffs into the lungs every 6 (six) hours as needed for wheezing or shortness of breath (cough).   prn at prn  . amoxicillin-clavulanate (AUGMENTIN) 500-125 MG tablet Take 1 tablet (500 mg total) by mouth 3 (three) times daily. 15 tablet 0   . baclofen (LIORESAL) 10 MG tablet Take 1 tablet (10 mg total) by mouth 2 (two) times daily as needed for muscle spasms. 30 each 0 05/25/2016 at Unknown time  . benzonatate (TESSALON) 200 MG capsule Take 1 capsule (200 mg total) by mouth 2 (two) times daily  as needed for cough. 20 capsule 0   . calcium acetate (PHOSLO) 667 MG capsule Take 1,334 mg by mouth 3 (three) times daily with meals.    unknown at unknown  . carbamazepine (TEGRETOL) 200 MG tablet Take 2.5 tablets (500 mg total) by mouth 2 (two) times daily. Taken 2 1/2 tabs (500mg  ) twice a day (Patient not taking: Reported on 04/14/2016) 150 tablet 0 Not Taking  . cyclobenzaprine (FLEXERIL) 5 MG tablet Take 5 mg by mouth at bedtime as needed for muscle spasms.   prn at prn  . fluticasone furoate-vilanterol (BREO ELLIPTA) 100-25 MCG/INH AEPB Inhale 1 puff into the lungs daily.   unknown at unknown  . traMADol (ULTRAM) 50 MG tablet Take 1 tablet (50 mg total) by mouth every 6 (six) hours as needed. 30 tablet 0 unknown at Unknown time    Assessment: Pharmacy consulted to dose heparin in this 56 year old male with VTE.  CrCl = 13.7 ml/min. Pt received heparin 5000 units SQ on 3/9 @ 0530.   Goal of Therapy: Heparin level 0.3-0.7 units/ml Monitor platelets by anticoagulation protocol: Yes  Plan: Give 5000units bolus x 1 Start heparin infusion at 1500units/hr Check anti-Xa level in 8hours and daily while on heparin Continue to monitor H&H and platelets  3/10 AM heparin level 0.63. Recheck in 8 hours to confirm. 3/10 Heparin level resulted @ 0.48. Continue current rate. Recheck heparin level in am   3/11 AM heparin level 0.46. Continue current regimen. Recheck heparin level and CBC with tomorrow AM labs.  3/12 AM heparin level 0.35. Continue current regimen. Recheck heparin level and CBC with tomorrow AM labs.  3/13 - unable to get AM labs from patient. Lab unable to draw from arms, order for draw from foot was ordered, initially patient refused, then he agreed. RN attempted to hold foot down but patient was uncoorpative and unable to get draw. Spoke with Dr. Posey Pronto who stated it was ok to continue heparin drip without labs (HL and CBC). Plan is to get labs with HD  tomorrow.  3/14 HL @ 0400 0.38. Will continue current rate and will recheck HL 3/15 @ 0400. Will continue to monitor CBC.  3/15 HL @ 0340: 0.10 subtherapeutic, will bolus w/ 2350 units IV x 1 and will increase heparin drip rate to 1700 units/hour. Will recheck HL @ 1200 3/15 and continue to monitor CBC. -- Patient was off of heparin for about 20 minutes, labs were drawn then pt apparently turned off his pump, which explains subtherapeutic level. Will reassess w/ next HL.  Thank you  for this consult.  Tobie Lords, PharmD, BCPS Clinical Pharmacist 08/13/2016

## 2016-08-13 NOTE — Progress Notes (Signed)
Subjective:  Patient resting comfortably in bed today. He had hemodialysis yesterday. Patient will be due for dialysis again tomorrow.   Objective:  Vital signs in last 24 hours:  Temp:  [97.8 F (36.6 C)-98.2 F (36.8 C)] 97.8 F (36.6 C) (03/15 1212) Pulse Rate:  [64-73] 71 (03/15 1212) Resp:  [18] 18 (03/15 1212) BP: (176-231)/(63-108) 193/85 (03/15 1212) SpO2:  [95 %-99 %] 97 % (03/15 1212) Weight:  [78.7 kg (173 lb 6.4 oz)] 78.7 kg (173 lb 6.4 oz) (03/15 0441)  Weight change: 0.062 kg (2.2 oz) Filed Weights   08/12/16 0954 08/12/16 1329 08/13/16 0441  Weight: 77.4 kg (170 lb 10.2 oz) 74.4 kg (164 lb) 78.7 kg (173 lb 6.4 oz)    Intake/Output:    Intake/Output Summary (Last 24 hours) at 08/13/16 1552 Last data filed at 08/13/16 1200  Gross per 24 hour  Intake              718 ml  Output             1175 ml  Net             -457 ml     Physical Exam: General: Chronically ill appearing  HEENT Anicteric  Neck supple  Pulm/lungs Scattered rhonchi, normal effort   CVS/Heart Regular rhythm no rubs  Abdomen:  Soft, nontender, nondistended  Extremities: Trace edema  Neurologic: Awake, alert, following commands   Skin: Dry skin  Access: Left upper arm AV fistula       Basic Metabolic Panel:   Recent Labs Lab 08/07/16 0323 08/08/16 0606 08/10/16 0346 08/12/16 0404 08/12/16 1000 08/13/16 0340  NA 136 139 138 134*  --  133*  K 4.7 3.9 4.1 4.7  --  4.4  CL 100* 99* 99* 96*  --  95*  CO2 28 30 30 29   --  28  GLUCOSE 102* 81 89 81  --  94  BUN 32* 25* 49* 44*  --  37*  CREATININE 6.75* 5.44* 7.76* 6.56*  --  4.72*  CALCIUM 8.5* 8.7* 8.6* 9.0  --  9.3  PHOS  --   --  4.2  --  3.4 3.0     CBC:  Recent Labs Lab 08/08/16 1345 08/10/16 0346 08/12/16 0404  WBC 7.1 6.8 6.1  HGB 10.9* 10.9* 10.8*  HCT 31.2* 31.2* 31.0*  MCV 105.1* 106.1* 106.0*  PLT 162 172 198      Microbiology:  Recent Results (from the past 720 hour(s))  MRSA PCR Screening      Status: None   Collection Time: 08/03/16  6:09 PM  Result Value Ref Range Status   MRSA by PCR NEGATIVE NEGATIVE Final    Comment:        The GeneXpert MRSA Assay (FDA approved for NASAL specimens only), is one component of a comprehensive MRSA colonization surveillance program. It is not intended to diagnose MRSA infection nor to guide or monitor treatment for MRSA infections.     Coagulation Studies:  Recent Labs  08/12/16 0404 08/13/16 0340  LABPROT 19.1* 21.2*  INR 1.59 1.81    Urinalysis: No results for input(s): COLORURINE, LABSPEC, PHURINE, GLUCOSEU, HGBUR, BILIRUBINUR, KETONESUR, PROTEINUR, UROBILINOGEN, NITRITE, LEUKOCYTESUR in the last 72 hours.  Invalid input(s): APPERANCEUR    Imaging: Dg Chest 2 View  Result Date: 08/13/2016 CLINICAL DATA:  56 year old male status post cardiac arrest and resuscitation on 08/03/2016. End-stage renal disease. EXAM: CHEST  2 VIEW COMPARISON:  08/05/2016 and earlier. FINDINGS:  Seated AP and lateral views of the chest. Improved lung volumes and regressed streaky in confluent basilar and infrahilar opacity. Small pleural effusions. Chronic increased pulmonary interstitial markings suspected and appear not significantly changed since 05/30/2016. Stable cardiomegaly and mediastinal contours. Visualized tracheal air column is within normal limits. No pneumothorax. No areas of worsening ventilation. No acute osseous abnormality identified. Paucity of bowel gas in the upper abdomen. IMPRESSION: 1. Improved lung volumes and bibasilar ventilation. Small pleural effusions. 2. Chronic pulmonary interstitial changes.  Cardiomegaly. Electronically Signed   By: Genevie Ann M.D.   On: 08/13/2016 07:54     Medications:   . heparin 1,700 Units/hr (08/13/16 1006)   . amLODipine  10 mg Oral Daily  . calcium acetate  1,334 mg Oral TID WC  . carbamazepine  500 mg Oral BID  . cloNIDine  0.2 mg Oral TID  . docusate sodium  100 mg Oral Daily  .  epoetin (EPOGEN/PROCRIT) injection  4,000 Units Intravenous Q M,W,F-HD  . feeding supplement (NEPRO CARB STEADY)  237 mL Oral BID BM  . fluPHENAZine  2.5 mg Oral Daily  . hydrALAZINE  100 mg Oral Q6H  . Influenza vac split quadrivalent PF  0.5 mL Intramuscular Tomorrow-1000  . insulin aspart  0-15 Units Subcutaneous TID WC  . insulin aspart  0-5 Units Subcutaneous QHS  . [START ON 08/14/2016] minoxidil  10 mg Oral Daily  . pneumococcal 23 valent vaccine  0.5 mL Intramuscular Tomorrow-1000  . warfarin  7.5 mg Oral ONCE-1800  . Warfarin - Pharmacist Dosing Inpatient   Does not apply q1800   acetaminophen, hydrALAZINE, HYDROcodone-acetaminophen, ipratropium-albuterol, morphine injection, nitroGLYCERIN  Assessment/ Plan:  56 y.o.african American male  With schizophrenia, hypertension, hepatitis C, tobacco abuse, anemia of chronic kidney disease, secondary hyperparathyroidism, end-stage renal disease  [facility] MWF [place].   1.  End-stage renal disease on HD MWF 2.  Severe hyperkalemia, life threatening, had cardiac arrest with ACLS protocol. 3.  Acute respiratory failure, extubated 3/6 4.  Anemia of chronic kidney disease. Hgb 10.9 5.  RUE DVT, started on anticoagulation.  -   Patient seen at bedside.  Appears to be doing well given the fact that he had a cardiac arrest earlier this admission.  He has been progressing well with hemodialysis on Monday, Wednesday, Friday.  Next dialysis will be due tomorrow.  Serum potassium stable currently at 4.4.  Patient also on warfarin as well as heparin drip for right upper extremity DVT.  We will continue to monitor his progress closely.     LOS: [place] 3/15/20183:52 PM

## 2016-08-13 NOTE — Progress Notes (Signed)
[place] [facility] [place] NAME: Todd Brown    MR#:  [id number]  DATE OF BIRTH:  05-Nov-1960  CHIEF COMPLAINT:   Chief Complaint  Patient presents with  . Weakness  Patient feeling better chest x-ray shows improvement      REVIEW OF SYSTEMS:   Review of Systems  Constitutional: Negative for chills and fever.  HENT: Negative for hearing loss.   Eyes: Negative for blurred vision, double vision and photophobia.  Respiratory: Positive for cough. Negative for hemoptysis and shortness of breath.   Cardiovascular: Negative for palpitations, orthopnea and leg swelling.  Gastrointestinal: Negative for abdominal pain, diarrhea and vomiting.  Genitourinary: Negative for dysuria and urgency.  Musculoskeletal: Negative for myalgias and neck pain.  Skin: Negative for rash.  Neurological: Negative for dizziness, focal weakness, seizures, weakness and headaches.  Psychiatric/Behavioral: Negative for memory loss. The patient does not have insomnia.    is very slow to answer but answers appropriately.  DRUG ALLERGIES:   Allergies  Allergen Reactions  . Thorazine [Chlorpromazine] Other (See Comments)    Reaction:  Unknown , pt states it makes him feel real bad    VITALS:  Blood pressure (!) 193/85, pulse 71, temperature 97.8 F (36.6 C), temperature source Oral, resp. rate 18, height 6\' 1"  (1.854 m), weight 173 lb 6.4 oz (78.7 kg), SpO2 97 %.  PHYSICAL EXAMINATION:  GENERAL:  56 y.o.-year-old patient lying in the bed with no acute distress.Overall ill-appearing maleEYES: Pupils equal, round, reactive to light and . No scleral icterus. Extraocular muscles intact.  HEENT: Head atraumatic, normocephalic. Oropharynx and nasopharynx clear.  NECK:  Supple, no jugular venous distention. No thyroid enlargement, no tenderness.  LUNGS: Normal breath sounds bilaterally, no wheezing, rales,rhonchi or crepitation. No use of accessory muscles of  respiration.  CARDIOVASCULAR: S1, S2 normal. No murmurs, rubs, or gallops.  ABDOMEN: Soft, nontender, nondistended. Bowel sounds present. No organomegaly or mass.  EXTREMITIES: No pedal edema, cyanosis, or clubbing.  NEUROLOGIC:  unable to follow directions for this exam.  PSYCHIATRIC: The patient is alert and oriented x 3.  SKIN: No obvious rash, lesion, or ulcer.    LABORATORY PANEL:   CBC  Recent Labs Lab 08/12/16 0404  WBC 6.1  HGB 10.8*  HCT 31.0*  PLT 198   ------------------------------------------------------------------------------------------------------------------  Chemistries   Recent Labs Lab 08/13/16 0340  NA 133*  K 4.4  CL 95*  CO2 28  GLUCOSE 94  BUN 37*  CREATININE 4.72*  CALCIUM 9.3   ------------------------------------------------------------------------------------------------------------------  Cardiac Enzymes No results for input(s): TROPONINI in the last 168 hours. ------------------------------------------------------------------------------------------------------------------  RADIOLOGY:  Dg Chest 2 View  Result Date: 08/13/2016 CLINICAL DATA:  56 year old male status post cardiac arrest and resuscitation on 08/03/2016. End-stage renal disease. EXAM: CHEST  2 VIEW COMPARISON:  08/05/2016 and earlier. FINDINGS: Seated AP and lateral views of the chest. Improved lung volumes and regressed streaky in confluent basilar and infrahilar opacity. Small pleural effusions. Chronic increased pulmonary interstitial markings suspected and appear not significantly changed since 05/30/2016. Stable cardiomegaly and mediastinal contours. Visualized tracheal air column is within normal limits. No pneumothorax. No areas of worsening ventilation. No acute osseous abnormality identified. Paucity of bowel gas in the upper abdomen. IMPRESSION: 1. Improved lung volumes and bibasilar ventilation. Small pleural effusions. 2. Chronic pulmonary interstitial changes.   Cardiomegaly. Electronically Signed   By: Genevie Ann M.D.   On: 08/13/2016 07:54    EKG:   Orders placed or performed  during the hospital encounter of 08/03/16  . EKG 12-Lead  . EKG 12-Lead  . EKG 12-Lead  . EKG 12-Lead    ASSESSMENT AND PLAN:   #1 acute respiratory failure secondary to cardiac arrest with prolonged resuscitation with the ACLS  Wean o2 today Incentive spirometery cxr with improvment  #2 Acute on chronic diastolic heart failure, severe pulmonary hypertension: on HD.  #3 ESRD on hemodialysis, HD per nephrology   # 4 severe life-threatening hyperkalemia:, Resolved with hemodialysis  #5 history of schizophrenia:  He is on prolixinat home restared,continue Lamictal.  #6 essential hypertension: some imporovment increased minoxidil dose  #7. RUL DVT INR trending up continue IV heparin and Coumadin     All the records are reviewed and case discussed with Care Management/Social Workerr. Management plans discussed with the patient, family and they are in agreement.  CODE STATUS: full  TOTAL TIME TAKING CARE OF THIS PATIENT 91min.   POSSIBLE D/C IN 3-4DAYS, DEPENDING ON CLINICAL CONDITION.   Dustin Flock M.D on 08/13/2016 at 2:15 PM  Between 7am to 6pm - Pager - [phone]  After 6pm go to [email or web address] - password [email or web address]  Office  [phone]  CC: Primary care physician; Nino Glow McLean-Scocuzza, MD   Note: This dictation was prepared with Dragon dictation along with smaller phrase technology. Any transcriptional errors that result from this process are unintentional.

## 2016-08-13 NOTE — Progress Notes (Signed)
David in pharmacy notified of interruption of Heparin. Labs ordered for 12 noon.

## 2016-08-13 NOTE — Progress Notes (Signed)
ANTICOAGULATION CONSULT NOTE - Initial Consult  Pharmacy Consult for Heparin  Indication: VTE prophylaxis       Allergies  Allergen Reactions  . Thorazine [Chlorpromazine] Other (See Comments)    Reaction: Unknown , pt states it makes him feel real bad    Patient Measurements: Height: 6\' 1"  (185.4 cm) Weight: 176 lb 8 oz (80.1 kg) IBW/kg (Calculated) : 79.9 Heparin Dosing Weight: 85.7 kg   Vital Signs: Temp: 98.6 F (37 C) (03/13 1104) Temp Source: Oral (03/13 1104) BP: 172/75 (03/13 1107) Pulse Rate: 60 (03/13 1107)  Labs:  Recent Labs (last 2 labs)    Recent Labs  08/08/16 1345 08/09/16 0512 08/09/16 1325 08/10/16 0346  HGB 10.9* --  --  10.9*  HCT 31.2* --  --  31.2*  PLT 162 --  --  172  LABPROT --  --  12.9 12.9  INR --  --  0.97 0.97  HEPARINUNFRC 0.48 0.46 --  0.35  CREATININE --  --  --  7.76*      Estimated Creatinine Clearance: 12.2 mL/min (by C-G formula based on SCr of 7.76 mg/dL (H)).   Medical History:     Past Medical History:  Diagnosis Date  . Anemia   . Diabetes mellitus without complication ([place])   . Dialysis patient [facility])    Mon. -Wed.- Fri  . ESRD (end stage renal disease) ([place])   . Hyperlipidemia   . Malignant hypertension   . Renal artery stenosis ([place])   . Schizophrenia ([place])     Medications:         Prescriptions Prior to Admission  Medication Sig Dispense Refill Last Dose  . amLODipine (NORVASC) 10 MG tablet Take 10 mg by mouth daily.   unknown at unknown  . atenolol (TENORMIN) 25 MG tablet Take 25 mg by mouth 2 (two) times daily.    unknown at unknown  . b complex-vitamin c-folic acid (NEPHRO-VITE) 0.8 MG TABS tablet Take 1 tablet by mouth daily.   unknown at unknown  . calcium acetate (PHOSLO) 667 MG capsule Take 2 capsules (1,334 mg total) by mouth 3 (three) times daily with meals. 60 capsule 1 unknown at unknown  . carbamazepine (TEGRETOL XR) 200 MG 12 hr  tablet Take 1,000 mg by mouth every morning.   unknown at unknown  . cholecalciferol (VITAMIN D) 1000 UNITS tablet Take 1,000 Units by mouth daily.   unknown at unknown  . citalopram (CELEXA) 20 MG tablet Take 1 tablet (20 mg total) by mouth daily. 30 tablet 1 unknown at unknown  . cloNIDine (CATAPRES) 0.2 MG tablet Take 1 tablet (0.2 mg total) by mouth 3 (three) times daily. 90 tablet 1 unknown at unknown  . ferrous sulfate 325 (65 FE) MG tablet Take 1 tablet (325 mg total) by mouth daily. 30 tablet 3 unknown at unknown  . fluPHENAZine (PROLIXIN) 2.5 MG tablet Take 2.5 mg by mouth daily.    unknown at unknown  . Fluticasone-Salmeterol (ADVAIR DISKUS) 250-50 MCG/DOSE AEPB Inhale 1 puff into the lungs 2 (two) times daily.   unknown at unknown  . hydrALAZINE (APRESOLINE) 100 MG tablet Take 1 tablet (100 mg total) by mouth every 6 (six) hours. (Patient taking differently: Take 100 mg by mouth 2 (two) times daily. ) 120 tablet 1 unknown at unknown  . minoxidil (LONITEN) 2.5 MG tablet Take 2 tablets (5 mg total) by mouth daily. 60 tablet 1 unknown at unknown  . trihexyphenidyl (ARTANE) 5 MG tablet Take 5 mg  by mouth 3 (three) times daily with meals.   unknown at unknown  . acetaminophen (TYLENOL) 500 MG tablet Take 500 mg by mouth every 4 (four) hours as needed for mild pain. Take 1 tablet every 4-6 hours as needed for pain.   prn at prn  . albuterol (PROAIR HFA) 108 (90 Base) MCG/ACT inhaler Inhale 1-2 puffs into the lungs every 6 (six) hours as needed for wheezing or shortness of breath (cough).   prn at prn  . amoxicillin-clavulanate (AUGMENTIN) 500-125 MG tablet Take 1 tablet (500 mg total) by mouth 3 (three) times daily. 15 tablet 0   . baclofen (LIORESAL) 10 MG tablet Take 1 tablet (10 mg total) by mouth 2 (two) times daily as needed for muscle spasms. 30 each 0 05/25/2016 at Unknown time  . benzonatate (TESSALON) 200 MG capsule Take 1 capsule (200 mg total) by mouth 2 (two) times daily  as needed for cough. 20 capsule 0   . calcium acetate (PHOSLO) 667 MG capsule Take 1,334 mg by mouth 3 (three) times daily with meals.    unknown at unknown  . carbamazepine (TEGRETOL) 200 MG tablet Take 2.5 tablets (500 mg total) by mouth 2 (two) times daily. Taken 2 1/2 tabs (500mg  ) twice a day (Patient not taking: Reported on 04/14/2016) 150 tablet 0 Not Taking  . cyclobenzaprine (FLEXERIL) 5 MG tablet Take 5 mg by mouth at bedtime as needed for muscle spasms.   prn at prn  . fluticasone furoate-vilanterol (BREO ELLIPTA) 100-25 MCG/INH AEPB Inhale 1 puff into the lungs daily.   unknown at unknown  . traMADol (ULTRAM) 50 MG tablet Take 1 tablet (50 mg total) by mouth every 6 (six) hours as needed. 30 tablet 0 unknown at Unknown time    Assessment: Pharmacy consulted to dose heparin in this 56 year old male with VTE.  CrCl = 13.7 ml/min. Pt received heparin 5000 units SQ on 3/9 @ 0530.   Goal of Therapy: Heparin level 0.3-0.7 units/ml Monitor platelets by anticoagulation protocol: Yes  Plan: Give 5000units bolus x 1 Start heparin infusion at 1500units/hr Check anti-Xa level in 8hours and daily while on heparin Continue to monitor H&H and platelets  3/10 AM heparin level 0.63. Recheck in 8 hours to confirm. 3/10 Heparin level resulted @ 0.48. Continue current rate. Recheck heparin level in am   3/11 AM heparin level 0.46. Continue current regimen. Recheck heparin level and CBC with tomorrow AM labs.  3/12 AM heparin level 0.35. Continue current regimen. Recheck heparin level and CBC with tomorrow AM labs.  3/13 - unable to get AM labs from patient. Lab unable to draw from arms, order for draw from foot was ordered, initially patient refused, then he agreed. RN attempted to hold foot down but patient was uncoorpative and unable to get draw. Spoke with Dr. Posey Pronto who stated it was ok to continue heparin drip without labs (HL and CBC). Plan is to get labs with HD  tomorrow.  3/14 HL @ 0400 0.38. Will continue current rate and will recheck HL 3/15 @ 0400. Will continue to monitor CBC.  3/15 HL @ 0340: 0.10 subtherapeutic, will bolus w/ 2350 units IV x 1 and will increase heparin drip rate to 1700 units/hour. Will recheck HL @ 1200 3/15 and continue to monitor CBC. -- Patient was off of heparin for about 20 minutes, labs were drawn then pt apparently turned off his pump, which explains subtherapeutic level. Will reassess w/ next HL.  3/15 @  1539 HL therapeutic at 0.62. Will recheck level in 6 hours. If therapeutic will move to daily Hl and CBC checks with am labs.   Thank you for this consult.  Pernell Dupre, PharmD, BCPS Clinical Pharmacist 08/13/2016 4:41 PM

## 2016-08-13 NOTE — Progress Notes (Signed)
Panel locked on infusion pump.

## 2016-08-13 NOTE — Progress Notes (Signed)
Heparin infusion pump turned off by pt. Pt stated "It was making noise and I was trying to sleep." Educated on importance of not manipulating infusion pump. Encouraged to call nurse if pump beeps, voiced understanding.

## 2016-08-13 NOTE — Progress Notes (Signed)
ANTICOAGULATION CONSULT NOTE - Initial Consult  Pharmacy Consult for warfarin Indication: DVT       Allergies  Allergen Reactions  . Thorazine [Chlorpromazine] Other (See Comments)    Reaction: Unknown , pt states it makes him feel real bad    Patient Measurements: Height: 6\' 1"  (185.4 cm) Weight: 176 lb 8 oz (80.1 kg) IBW/kg (Calculated) : 79.9  Vital Signs: Temp: 98.6 F (37 C) (03/13 1104) Temp Source: Oral (03/13 1104) BP: 172/75 (03/13 1107) Pulse Rate: 60 (03/13 1107)  Labs:  Recent Labs (last 2 labs)    Recent Labs  08/08/16 1345 08/09/16 0512 08/09/16 1325 08/10/16 0346  HGB 10.9* --  --  10.9*  HCT 31.2* --  --  31.2*  PLT 162 --  --  172  LABPROT --  --  12.9 12.9  INR --  --  0.97 0.97  HEPARINUNFRC 0.48 0.46 --  0.35  CREATININE --  --  --  7.76*      Estimated Creatinine Clearance: 12.2 mL/min (by C-G formula based on SCr of 7.76 mg/dL (H)).   Medical History:     Past Medical History:  Diagnosis Date  . Anemia   . Diabetes mellitus without complication ([place])   . Dialysis patient [facility])    Mon. -Wed.- Fri  . ESRD (end stage renal disease) ([place])   . Hyperlipidemia   . Malignant hypertension   . Renal artery stenosis ([place])   . Schizophrenia ([place])    Medications:         Prescriptions Prior to Admission  Medication Sig Dispense Refill Last Dose  . amLODipine (NORVASC) 10 MG tablet Take 10 mg by mouth daily.   unknown at unknown  . atenolol (TENORMIN) 25 MG tablet Take 25 mg by mouth 2 (two) times daily.    unknown at unknown  . b complex-vitamin c-folic acid (NEPHRO-VITE) 0.8 MG TABS tablet Take 1 tablet by mouth daily.   unknown at unknown  . calcium acetate (PHOSLO) 667 MG capsule Take 2 capsules (1,334 mg total) by mouth 3 (three) times daily with meals. 60 capsule 1 unknown at unknown  . carbamazepine (TEGRETOL XR) 200 MG 12 hr tablet Take 1,000 mg by mouth every morning.    unknown at unknown  . cholecalciferol (VITAMIN D) 1000 UNITS tablet Take 1,000 Units by mouth daily.   unknown at unknown  . citalopram (CELEXA) 20 MG tablet Take 1 tablet (20 mg total) by mouth daily. 30 tablet 1 unknown at unknown  . cloNIDine (CATAPRES) 0.2 MG tablet Take 1 tablet (0.2 mg total) by mouth 3 (three) times daily. 90 tablet 1 unknown at unknown  . ferrous sulfate 325 (65 FE) MG tablet Take 1 tablet (325 mg total) by mouth daily. 30 tablet 3 unknown at unknown  . fluPHENAZine (PROLIXIN) 2.5 MG tablet Take 2.5 mg by mouth daily.    unknown at unknown  . Fluticasone-Salmeterol (ADVAIR DISKUS) 250-50 MCG/DOSE AEPB Inhale 1 puff into the lungs 2 (two) times daily.   unknown at unknown  . hydrALAZINE (APRESOLINE) 100 MG tablet Take 1 tablet (100 mg total) by mouth every 6 (six) hours. (Patient taking differently: Take 100 mg by mouth 2 (two) times daily. ) 120 tablet 1 unknown at unknown  . minoxidil (LONITEN) 2.5 MG tablet Take 2 tablets (5 mg total) by mouth daily. 60 tablet 1 unknown at unknown  . trihexyphenidyl (ARTANE) 5 MG tablet Take 5 mg by mouth 3 (three) times daily with meals.  unknown at unknown  . acetaminophen (TYLENOL) 500 MG tablet Take 500 mg by mouth every 4 (four) hours as needed for mild pain. Take 1 tablet every 4-6 hours as needed for pain.   prn at prn  . albuterol (PROAIR HFA) 108 (90 Base) MCG/ACT inhaler Inhale 1-2 puffs into the lungs every 6 (six) hours as needed for wheezing or shortness of breath (cough).   prn at prn  . amoxicillin-clavulanate (AUGMENTIN) 500-125 MG tablet Take 1 tablet (500 mg total) by mouth 3 (three) times daily. 15 tablet 0   . baclofen (LIORESAL) 10 MG tablet Take 1 tablet (10 mg total) by mouth 2 (two) times daily as needed for muscle spasms. 30 each 0 05/25/2016 at Unknown time  . benzonatate (TESSALON) 200 MG capsule Take 1 capsule (200 mg total) by mouth 2 (two) times daily as needed for cough. 20 capsule 0   . calcium  acetate (PHOSLO) 667 MG capsule Take 1,334 mg by mouth 3 (three) times daily with meals.    unknown at unknown  . carbamazepine (TEGRETOL) 200 MG tablet Take 2.5 tablets (500 mg total) by mouth 2 (two) times daily. Taken 2 1/2 tabs (500mg  ) twice a day (Patient not taking: Reported on 04/14/2016) 150 tablet 0 Not Taking  . cyclobenzaprine (FLEXERIL) 5 MG tablet Take 5 mg by mouth at bedtime as needed for muscle spasms.   prn at prn  . fluticasone furoate-vilanterol (BREO ELLIPTA) 100-25 MCG/INH AEPB Inhale 1 puff into the lungs daily.   unknown at unknown  . traMADol (ULTRAM) 50 MG tablet Take 1 tablet (50 mg total) by mouth every 6 (six) hours as needed. 30 tablet 0 unknown at Unknown time   Scheduled:  . amLODipine 10 mg Oral Daily  . calcium acetate 1,334 mg Oral TID WC  . carbamazepine 500 mg Oral BID  . cloNIDine 0.2 mg Oral TID  . docusate sodium 100 mg Oral Daily  . epoetin (EPOGEN/PROCRIT) injection 4,000 Units Intravenous Q M,W,F-HD  . feeding supplement (NEPRO CARB STEADY) 237 mL Oral BID BM  . fluPHENAZine 2.5 mg Oral Daily  . hydrALAZINE 100 mg Oral Q8H  . Influenza vac split quadrivalent PF 0.5 mL Intramuscular Tomorrow-1000  . insulin aspart 0-15 Units Subcutaneous TID WC  . insulin aspart 0-5 Units Subcutaneous QHS  . minoxidil 5 mg Oral Daily  . pneumococcal 23 valent vaccine 0.5 mL Intramuscular Tomorrow-1000  . warfarin 10 mg Oral ONCE-1800  . Warfarin - Pharmacist Dosing Inpatient  Does not apply q1800   Infusions:  . heparin 1,500 Units/hr (08/11/16 7588)    Assessment: Pharmacy consulted to dose and monitor warfarin in this 55 year old male for treatment of a right upper extremity DVT. Patient also has ESRD on HD and has been anticoagulated with a heparin drip.   Patient not eligible for DOAC due to contraindicated drug interaction with carbamazepine. Patient is being started on warfarin today with heparin bridge.   Goal of  Therapy: INR 2-3 Monitor platelets by anticoagulation protocol: Yes  Plan: 3/11 INR 0.97  warfarin 7.5mg  3/12 INR 0.97  warfarin 7.5mg  3/13                 warfarin 10 mg 3/14 INR 1.59  warfarin 7.5 mg 3/15 INR 1.81     Unable to get AM labs from patient. Lab unable to draw from arms, order for draw from foot was ordered, initially patient refused, then he agreed. RN attempted to hold foot down  but patient was uncoorpative and unable to get draw. Spoke with Dr. Posey Pronto who stated it was ok to continue with warfarin and heparin tonight.  Using dosing Nomogram, patient qualified for an initial dose of warfarin 10mg . Patient also on carbamazepine which induces metabolism of warfarin. No movement in INR after one dose of 7.5mg . Per Dr. Posey Pronto, ok to give 10mg  tonight. Plan is to get labs with HD tomorrow.   3/14 INR climbing up but still subtherapeutic, will go back to warfarin 7.5 mg dose and will recheck INR w/ am labs.  3/15 INR up to 1.81 will give another warfarin 7.5 mg tonight as INR will continue to go up from the warfarin 10 mg dose.  Thank you for this consult.  Tobie Lords, PharmD, BCPS Clinical Pharmacist 08/13/2016   Thank you for this consult.

## 2016-08-14 LAB — CBC
HCT: 33.8 % — ABNORMAL LOW (ref 40.0–52.0)
Hemoglobin: 11.5 g/dL — ABNORMAL LOW (ref 13.0–18.0)
MCH: 36.4 pg — AB (ref 26.0–34.0)
MCHC: 34.2 g/dL (ref 32.0–36.0)
MCV: 106.4 fL — ABNORMAL HIGH (ref 80.0–100.0)
PLATELETS: 242 10*3/uL (ref 150–440)
RBC: 3.17 MIL/uL — ABNORMAL LOW (ref 4.40–5.90)
RDW: 14.9 % — AB (ref 11.5–14.5)
WBC: 6.5 10*3/uL (ref 3.8–10.6)

## 2016-08-14 LAB — PHOSPHORUS: Phosphorus: 4.1 mg/dL (ref 2.5–4.6)

## 2016-08-14 LAB — PROTIME-INR
INR: 1.75
PROTHROMBIN TIME: 20.7 s — AB (ref 11.4–15.2)

## 2016-08-14 LAB — GLUCOSE, CAPILLARY
GLUCOSE-CAPILLARY: 79 mg/dL (ref 65–99)
GLUCOSE-CAPILLARY: 101 mg/dL — AB (ref 65–99)
Glucose-Capillary: 87 mg/dL (ref 65–99)

## 2016-08-14 MED ORDER — WARFARIN SODIUM 5 MG PO TABS
10.0000 mg | ORAL_TABLET | Freq: Once | ORAL | Status: AC
  Administered 2016-08-14: 10 mg via ORAL
  Filled 2016-08-14: qty 2

## 2016-08-14 NOTE — Progress Notes (Addendum)
Physical Therapy Treatment Patient Details Name: Todd Brown MRN: [id number] DOB: Jul 03, 1960 Today's Date: 08/14/2016    History of Present Illness Pt is a 56 y.o. male presenting to hospital with generalized weakness and 1 fall.  Pt with cardiac arrest in ED (30 minutes ACLS before ROSC) and received emergent HD.  ETT placed 08/03/16 and extubated 08/04/16.  Pt admitted with cardiac arrest d/t hyperkalemia and acute respiratory failure.  Pt found to have acute R occlusive cephalic and basilic vein thrombosis and heparin drip started 08/07/16.  PMH includes anemia, DM, HD MWF, schizophrenia, malIgnant hypertension, Hep C, ESRD.    PT Comments    Pt reports fatigue and body aches following dialysis and prefers not to ambulate at this time. He does however agree to bed exercises. Pt able to perform bed exercises as instructed with fair strength noted. Upon arrival he is on room air and throughout session SaO2 remains at or above 93%. Pt complains of increased chest pain. He was medicated prior to session however RN notified that chest pain is persistent. Pt will benefit from skilled PT services to address deficits in strength, balance, and mobility in order to return to full function at home.       Follow Up Recommendations  Home health PT;Supervision/Assistance - 24 hour     Equipment Recommendations  Rolling walker with 5" wheels    Recommendations for Other Services       Precautions / Restrictions Precautions Precautions: Fall Precaution Comments: L UE AV graft; R UE DVT Restrictions Weight Bearing Restrictions: No    Mobility  Bed Mobility               General bed mobility comments: Pt refuses bed mobility, transfers, or ambulation at this time  Transfers                    Ambulation/Gait                 Stairs            Wheelchair Mobility    Modified Rankin (Stroke Patients Only)       Balance                                     Cognition Arousal/Alertness: Awake/alert Behavior During Therapy: WFL for tasks assessed/performed Overall Cognitive Status: Within Functional Limits for tasks assessed                      Exercises General Exercises - Lower Extremity Ankle Circles/Pumps: AROM;Both;15 reps;Supine Quad Sets: Strengthening;Both;15 reps;Supine Gluteal Sets: Strengthening;Both;15 reps;Supine Short Arc Quad: Strengthening;Both;15 reps;Supine Heel Slides: Strengthening;Both;Supine;10 reps Hip ABduction/ADduction: Strengthening;Both;15 reps;Supine Straight Leg Raises: Strengthening;Both;15 reps;Supine    General Comments        Pertinent Vitals/Pain Pain Assessment: 0-10 Pain Score: 9  Pain Location: chest pain with deep breaths (from chest compressions) Pain Descriptors / Indicators: Aching Pain Intervention(s): Limited activity within patient's tolerance    Home Living                      Prior Function            PT Goals (current goals can now be found in the care plan section) Acute Rehab PT Goals Patient Stated Goal: to sleep PT Goal Formulation: With patient Time For Goal Achievement: 08/23/16 Potential to Achieve Goals:  Good Progress towards PT goals: Progressing toward goals    Frequency    Min 2X/week      PT Plan Current plan remains appropriate    Co-evaluation             End of Session   Activity Tolerance: Patient tolerated treatment well Patient left: in bed;with call bell/phone within reach;with bed alarm set Nurse Communication: Other (comment) (chest pain) PT Visit Diagnosis: Unsteadiness on feet (R26.81);Other abnormalities of gait and mobility (R26.89);History of falling (Z91.81);Muscle weakness (generalized) (M62.81)     Time: 8756-4332 PT Time Calculation (min) (ACUTE ONLY): 14 min  Charges:  $Therapeutic Exercise: 8-22 mins                    G Codes:      Lyndel Safe Huprich PT, DPT    Huprich,Jason 08/14/2016, 4:16 PM

## 2016-08-14 NOTE — Progress Notes (Signed)
Subjective:  Patient seen and evaluated during hemodialysis. Ultrafiltration target 3-3.5 kg today. Overall doing well.  Objective:  Vital signs in last 24 hours:  Temp:  [97.8 F (36.6 C)-98.1 F (36.7 C)] 98.1 F (36.7 C) (03/16 0934) Pulse Rate:  [65-89] 89 (03/16 1200) Resp:  [10-18] 12 (03/16 1200) BP: (144-208)/(62-94) 168/87 (03/16 1200) SpO2:  [97 %-100 %] 99 % (03/16 1200) Weight:  [80.7 kg (177 lb 14.6 oz)-80.7 kg (178 lb)] 80.7 kg (177 lb 14.6 oz) (03/16 0934)  Weight change: 3.34 kg (7 lb 5.8 oz) Filed Weights   08/13/16 0441 08/14/16 0608 08/14/16 0934  Weight: 78.7 kg (173 lb 6.4 oz) 80.7 kg (178 lb) 80.7 kg (177 lb 14.6 oz)    Intake/Output:    Intake/Output Summary (Last 24 hours) at 08/14/16 1213 Last data filed at 08/14/16 0841  Gross per 24 hour  Intake              308 ml  Output              875 ml  Net             -567 ml     Physical Exam: General: Chronically ill appearing  HEENT Anicteric  Neck supple  Pulm/lungs CTAB, normal effort   CVS/Heart Regular rhythm no rubs  Abdomen:  Soft, nontender, nondistended  Extremities: Trace edema  Neurologic: Awake, alert, following commands   Skin: Dry skin  Access: Left upper arm AV fistula       Basic Metabolic Panel:   Recent Labs Lab 08/08/16 0606 08/10/16 0346 08/12/16 0404 08/12/16 1000 08/13/16 0340 08/14/16 0937  NA 139 138 134*  --  133*  --   K 3.9 4.1 4.7  --  4.4  --   CL 99* 99* 96*  --  95*  --   CO2 30 30 29   --  28  --   GLUCOSE 81 89 81  --  94  --   BUN 25* 49* 44*  --  37*  --   CREATININE 5.44* 7.76* 6.56*  --  4.72*  --   CALCIUM 8.7* 8.6* 9.0  --  9.3  --   PHOS  --  4.2  --  3.4 3.0 4.1     CBC:  Recent Labs Lab 08/08/16 1345 08/10/16 0346 08/12/16 0404 08/14/16 0409  WBC 7.1 6.8 6.1 6.5  HGB 10.9* 10.9* 10.8* 11.5*  HCT 31.2* 31.2* 31.0* 33.8*  MCV 105.1* 106.1* 106.0* 106.4*  PLT 162 172 198 242      Microbiology:  Recent Results (from  the past 720 hour(s))  MRSA PCR Screening     Status: None   Collection Time: 08/03/16  6:09 PM  Result Value Ref Range Status   MRSA by PCR NEGATIVE NEGATIVE Final    Comment:        The GeneXpert MRSA Assay (FDA approved for NASAL specimens only), is one component of a comprehensive MRSA colonization surveillance program. It is not intended to diagnose MRSA infection nor to guide or monitor treatment for MRSA infections.     Coagulation Studies:  Recent Labs  08/12/16 0404 08/13/16 0340 08/14/16 0409  LABPROT 19.1* 21.2* 20.7*  INR 1.59 1.81 1.75    Urinalysis: No results for input(s): COLORURINE, LABSPEC, PHURINE, GLUCOSEU, HGBUR, BILIRUBINUR, KETONESUR, PROTEINUR, UROBILINOGEN, NITRITE, LEUKOCYTESUR in the last 72 hours.  Invalid input(s): APPERANCEUR    Imaging: Dg Chest 2 View  Result Date: 08/13/2016 CLINICAL DATA:  56 year old male status post cardiac arrest and resuscitation on 08/03/2016. End-stage renal disease. EXAM: CHEST  2 VIEW COMPARISON:  08/05/2016 and earlier. FINDINGS: Seated AP and lateral views of the chest. Improved lung volumes and regressed streaky in confluent basilar and infrahilar opacity. Small pleural effusions. Chronic increased pulmonary interstitial markings suspected and appear not significantly changed since 05/30/2016. Stable cardiomegaly and mediastinal contours. Visualized tracheal air column is within normal limits. No pneumothorax. No areas of worsening ventilation. No acute osseous abnormality identified. Paucity of bowel gas in the upper abdomen. IMPRESSION: 1. Improved lung volumes and bibasilar ventilation. Small pleural effusions. 2. Chronic pulmonary interstitial changes.  Cardiomegaly. Electronically Signed   By: Genevie Ann M.D.   On: 08/13/2016 07:54     Medications:   . heparin 1,700 Units/hr (08/14/16 0005)   . amLODipine  10 mg Oral Daily  . calcium acetate  1,334 mg Oral TID WC  . carbamazepine  500 mg Oral BID  .  cloNIDine  0.2 mg Oral TID  . docusate sodium  100 mg Oral Daily  . epoetin (EPOGEN/PROCRIT) injection  4,000 Units Intravenous Q M,W,F-HD  . feeding supplement (NEPRO CARB STEADY)  237 mL Oral BID BM  . fluPHENAZine  2.5 mg Oral Daily  . hydrALAZINE  100 mg Oral Q6H  . Influenza vac split quadrivalent PF  0.5 mL Intramuscular Tomorrow-1000  . insulin aspart  0-15 Units Subcutaneous TID WC  . insulin aspart  0-5 Units Subcutaneous QHS  . minoxidil  10 mg Oral Daily  . pneumococcal 23 valent vaccine  0.5 mL Intramuscular Tomorrow-1000  . warfarin  10 mg Oral ONCE-1800  . Warfarin - Pharmacist Dosing Inpatient   Does not apply q1800   acetaminophen, hydrALAZINE, HYDROcodone-acetaminophen, ipratropium-albuterol, morphine injection, nitroGLYCERIN  Assessment/ Plan:  56 y.o.african American male  With schizophrenia, hypertension, hepatitis C, tobacco abuse, anemia of chronic kidney disease, secondary hyperparathyroidism, end-stage renal disease  [facility] MWF [place].   1.  End-stage renal disease on HD MWF 2.  Severe hyperkalemia, life threatening, had cardiac arrest with ACLS protocol. 3.  Acute respiratory failure, extubated 3/6 4.  Anemia of chronic kidney disease. Hgb 11.5 5.  RUE DVT, started on anticoagulation. 6.  Secondary hyperparathyroidism.   -   Patient seen and evaluated during hemodialysis today. Ultrafiltration target 3-3.5 kg.  Patient is being dialyzed against a 2K bath.  Serum potassium yesterday was normal at 4.4. Anemia also appears to be stable and hemoglobin is currently 11.5.  Phosphorus is also under good control with most recent serum phosphorus of 4.1. Otherwise disposition as per hospitalist.     LOS: 11 Sharan Mcenaney 3/16/201812:13 PM

## 2016-08-14 NOTE — Progress Notes (Signed)
ANTICOAGULATION CONSULT NOTE - Initial Consult  Pharmacy Consult for warfarin Indication: DVT       Allergies  Allergen Reactions  . Thorazine [Chlorpromazine] Other (See Comments)    Reaction: Unknown , pt states it makes him feel real bad    Patient Measurements: Height: 6\' 1"  (185.4 cm) Weight: 176 lb 8 oz (80.1 kg) IBW/kg (Calculated) : 79.9  Vital Signs: Temp: 98.6 F (37 C) (03/13 1104) Temp Source: Oral (03/13 1104) BP: 172/75 (03/13 1107) Pulse Rate: 60 (03/13 1107)  Labs:  Recent Labs (last 2 labs)    Recent Labs  08/08/16 1345 08/09/16 0512 08/09/16 1325 08/10/16 0346  HGB 10.9* --  --  10.9*  HCT 31.2* --  --  31.2*  PLT 162 --  --  172  LABPROT --  --  12.9 12.9  INR --  --  0.97 0.97  HEPARINUNFRC 0.48 0.46 --  0.35  CREATININE --  --  --  7.76*      Estimated Creatinine Clearance: 12.2 mL/min (by C-G formula based on SCr of 7.76 mg/dL (H)).   Medical History:     Past Medical History:  Diagnosis Date  . Anemia   . Diabetes mellitus without complication ([place])   . Dialysis patient [facility])    Mon. -Wed.- Fri  . ESRD (end stage renal disease) ([place])   . Hyperlipidemia   . Malignant hypertension   . Renal artery stenosis ([place])   . Schizophrenia ([place])    Medications:         Prescriptions Prior to Admission  Medication Sig Dispense Refill Last Dose  . amLODipine (NORVASC) 10 MG tablet Take 10 mg by mouth daily.   unknown at unknown  . atenolol (TENORMIN) 25 MG tablet Take 25 mg by mouth 2 (two) times daily.    unknown at unknown  . b complex-vitamin c-folic acid (NEPHRO-VITE) 0.8 MG TABS tablet Take 1 tablet by mouth daily.   unknown at unknown  . calcium acetate (PHOSLO) 667 MG capsule Take 2 capsules (1,334 mg total) by mouth 3 (three) times daily with meals. 60 capsule 1 unknown at unknown  . carbamazepine (TEGRETOL XR) 200 MG 12 hr tablet Take 1,000 mg by mouth every morning.    unknown at unknown  . cholecalciferol (VITAMIN D) 1000 UNITS tablet Take 1,000 Units by mouth daily.   unknown at unknown  . citalopram (CELEXA) 20 MG tablet Take 1 tablet (20 mg total) by mouth daily. 30 tablet 1 unknown at unknown  . cloNIDine (CATAPRES) 0.2 MG tablet Take 1 tablet (0.2 mg total) by mouth 3 (three) times daily. 90 tablet 1 unknown at unknown  . ferrous sulfate 325 (65 FE) MG tablet Take 1 tablet (325 mg total) by mouth daily. 30 tablet 3 unknown at unknown  . fluPHENAZine (PROLIXIN) 2.5 MG tablet Take 2.5 mg by mouth daily.    unknown at unknown  . Fluticasone-Salmeterol (ADVAIR DISKUS) 250-50 MCG/DOSE AEPB Inhale 1 puff into the lungs 2 (two) times daily.   unknown at unknown  . hydrALAZINE (APRESOLINE) 100 MG tablet Take 1 tablet (100 mg total) by mouth every 6 (six) hours. (Patient taking differently: Take 100 mg by mouth 2 (two) times daily. ) 120 tablet 1 unknown at unknown  . minoxidil (LONITEN) 2.5 MG tablet Take 2 tablets (5 mg total) by mouth daily. 60 tablet 1 unknown at unknown  . trihexyphenidyl (ARTANE) 5 MG tablet Take 5 mg by mouth 3 (three) times daily with meals.  unknown at unknown  . acetaminophen (TYLENOL) 500 MG tablet Take 500 mg by mouth every 4 (four) hours as needed for mild pain. Take 1 tablet every 4-6 hours as needed for pain.   prn at prn  . albuterol (PROAIR HFA) 108 (90 Base) MCG/ACT inhaler Inhale 1-2 puffs into the lungs every 6 (six) hours as needed for wheezing or shortness of breath (cough).   prn at prn  . amoxicillin-clavulanate (AUGMENTIN) 500-125 MG tablet Take 1 tablet (500 mg total) by mouth 3 (three) times daily. 15 tablet 0   . baclofen (LIORESAL) 10 MG tablet Take 1 tablet (10 mg total) by mouth 2 (two) times daily as needed for muscle spasms. 30 each 0 05/25/2016 at Unknown time  . benzonatate (TESSALON) 200 MG capsule Take 1 capsule (200 mg total) by mouth 2 (two) times daily as needed for cough. 20 capsule 0   . calcium  acetate (PHOSLO) 667 MG capsule Take 1,334 mg by mouth 3 (three) times daily with meals.    unknown at unknown  . carbamazepine (TEGRETOL) 200 MG tablet Take 2.5 tablets (500 mg total) by mouth 2 (two) times daily. Taken 2 1/2 tabs (500mg  ) twice a day (Patient not taking: Reported on 04/14/2016) 150 tablet 0 Not Taking  . cyclobenzaprine (FLEXERIL) 5 MG tablet Take 5 mg by mouth at bedtime as needed for muscle spasms.   prn at prn  . fluticasone furoate-vilanterol (BREO ELLIPTA) 100-25 MCG/INH AEPB Inhale 1 puff into the lungs daily.   unknown at unknown  . traMADol (ULTRAM) 50 MG tablet Take 1 tablet (50 mg total) by mouth every 6 (six) hours as needed. 30 tablet 0 unknown at Unknown time   Scheduled:  . amLODipine 10 mg Oral Daily  . calcium acetate 1,334 mg Oral TID WC  . carbamazepine 500 mg Oral BID  . cloNIDine 0.2 mg Oral TID  . docusate sodium 100 mg Oral Daily  . epoetin (EPOGEN/PROCRIT) injection 4,000 Units Intravenous Q M,W,F-HD  . feeding supplement (NEPRO CARB STEADY) 237 mL Oral BID BM  . fluPHENAZine 2.5 mg Oral Daily  . hydrALAZINE 100 mg Oral Q8H  . Influenza vac split quadrivalent PF 0.5 mL Intramuscular Tomorrow-1000  . insulin aspart 0-15 Units Subcutaneous TID WC  . insulin aspart 0-5 Units Subcutaneous QHS  . minoxidil 5 mg Oral Daily  . pneumococcal 23 valent vaccine 0.5 mL Intramuscular Tomorrow-1000  . warfarin 10 mg Oral ONCE-1800  . Warfarin - Pharmacist Dosing Inpatient  Does not apply q1800   Infusions:  . heparin 1,500 Units/hr (08/11/16 3220)    Assessment: Pharmacy consulted to dose and monitor warfarin in this 56 year old male for treatment of a right upper extremity DVT. Patient also has ESRD on HD and has been anticoagulated with a heparin drip.   Patient not eligible for DOAC due to contraindicated drug interaction with carbamazepine. Patient is being started on warfarin today with heparin bridge.   Goal of  Therapy: INR 2-3 Monitor platelets by anticoagulation protocol: Yes  Plan: 3/11 INR 0.97  warfarin 7.5mg  3/12 INR 0.97  warfarin 7.5mg  3/13                 warfarin 10 mg 3/14 INR 1.59  warfarin 7.5 mg 3/15 INR 1.81  Warfarin 7.5mg  3/16 INR 1.75  3/16 INR dropped slightly. Will give 10mg  tonight. Recheck INR in the AM.  Thank you for this consult.  Ramond Dial, Pharm.D, BCPS Clinical Pharmacist  Thank  you for this consult.

## 2016-08-14 NOTE — Progress Notes (Signed)
[place] [facility] [place] NAME: Todd Brown    MR#:  [id number]  DATE OF BIRTH:  [birth year]  CHIEF COMPLAINT:   Chief Complaint  Patient presents with  . Weakness   Pt feels better, inr not theraputic      REVIEW OF SYSTEMS:   Review of Systems  Constitutional: Negative for chills and fever.  HENT: Negative for hearing loss.   Eyes: Negative for blurred vision, double vision and photophobia.  Respiratory: Positive for cough. Negative for hemoptysis and shortness of breath.   Cardiovascular: Negative for palpitations, orthopnea and leg swelling.  Gastrointestinal: Negative for abdominal pain, diarrhea and vomiting.  Genitourinary: Negative for dysuria and urgency.  Musculoskeletal: Negative for myalgias and neck pain.  Skin: Negative for rash.  Neurological: Negative for dizziness, focal weakness, seizures, weakness and headaches.  Psychiatric/Behavioral: Negative for memory loss. The patient does not have insomnia.    is very slow to answer but answers appropriately.  DRUG ALLERGIES:   Allergies  Allergen Reactions  . Thorazine [Chlorpromazine] Other (See Comments)    Reaction:  Unknown , pt states it makes him feel real bad    VITALS:  Blood pressure (!) 156/83, pulse 84, temperature 98.5 F (36.9 C), temperature source Oral, resp. rate 16, height 6\' 1"  (1.854 m), weight 170 lb 13.7 oz (77.5 kg), SpO2 99 %.  PHYSICAL EXAMINATION:  GENERAL:  56 y.o.-year-old patient lying in the bed with no acute distress.Overall ill-appearing maleEYES: Pupils equal, round, reactive to light and . No scleral icterus. Extraocular muscles intact.  HEENT: Head atraumatic, normocephalic. Oropharynx and nasopharynx clear.  NECK:  Supple, no jugular venous distention. No thyroid enlargement, no tenderness.  LUNGS: Normal breath sounds bilaterally, no wheezing, rales,rhonchi or crepitation. No use of accessory muscles of respiration.   CARDIOVASCULAR: S1, S2 normal. No murmurs, rubs, or gallops.  ABDOMEN: Soft, nontender, nondistended. Bowel sounds present. No organomegaly or mass.  EXTREMITIES: No pedal edema, cyanosis, or clubbing. RUE swollen  NEUROLOGIC:  unable to follow directions for this exam.  PSYCHIATRIC: The patient is alert and oriented x 3.  SKIN: No obvious rash, lesion, or ulcer.    LABORATORY PANEL:   CBC  Recent Labs Lab 08/14/16 0409  WBC 6.5  HGB 11.5*  HCT 33.8*  PLT 242   ------------------------------------------------------------------------------------------------------------------  Chemistries   Recent Labs Lab 08/13/16 0340  NA 133*  K 4.4  CL 95*  CO2 28  GLUCOSE 94  BUN 37*  CREATININE 4.72*  CALCIUM 9.3   ------------------------------------------------------------------------------------------------------------------  Cardiac Enzymes No results for input(s): TROPONINI in the last 168 hours. ------------------------------------------------------------------------------------------------------------------  RADIOLOGY:  Dg Chest 2 View  Result Date: 08/13/2016 CLINICAL DATA:  56 year old male status post cardiac arrest and resuscitation on 08/03/2016. End-stage renal disease. EXAM: CHEST  2 VIEW COMPARISON:  08/05/2016 and earlier. FINDINGS: Seated AP and lateral views of the chest. Improved lung volumes and regressed streaky in confluent basilar and infrahilar opacity. Small pleural effusions. Chronic increased pulmonary interstitial markings suspected and appear not significantly changed since 05/30/2016. Stable cardiomegaly and mediastinal contours. Visualized tracheal air column is within normal limits. No pneumothorax. No areas of worsening ventilation. No acute osseous abnormality identified. Paucity of bowel gas in the upper abdomen. IMPRESSION: 1. Improved lung volumes and bibasilar ventilation. Small pleural effusions. 2. Chronic pulmonary interstitial changes.   Cardiomegaly. Electronically Signed   By: Genevie Ann M.D.   On: 08/13/2016 07:54    EKG:   Orders placed  or performed during the hospital encounter of 08/03/16  . EKG 12-Lead  . EKG 12-Lead  . EKG 12-Lead  . EKG 12-Lead    ASSESSMENT AND PLAN:   #1 acute respiratory failure secondary to cardiac arrest with prolonged resuscitation with the ACLS  Wean o2   Incentive spirometery cxr with improvment  #2 Acute on chronic diastolic heart failure, severe pulmonary hypertension: on HD.Fluid removal per nephrology  #3 ESRD on hemodialysis, HD per nephrology   # 4 severe life-threatening hyperkalemia:, Resulting in patient needing resuscitation Resolved with hemodialysis  #5 history of schizophrenia:  He is on prolixinat home restared,continue Lamictal.  #6 essential hypertension: Blood pressure better than before continue current regimen including minoxidil  #7. RUL DVT INR trending up continue IV heparin and   higher dose Coumadin tonight   All the records are reviewed and case discussed with Care Management/Social Workerr. Management plans discussed with the patient, family and they are in agreement.  CODE STATUS: full  TOTAL TIME TAKING CARE OF THIS PATIENT 26min.   POSSIBLE D/C IN 3-4DAYS, DEPENDING ON CLINICAL CONDITION.   Dustin Flock M.D on 08/14/2016 at 3:09 PM  Between 7am to 6pm - Pager - [phone]  After 6pm go to [email or web address] - password [email or web address] [facility] [facility]  Office  [phone]  CC: Primary care physician; Nino Glow McLean-Scocuzza, MD   Note: This dictation was prepared with Dragon dictation along with smaller phrase technology. Any transcriptional errors that result from this process are unintentional.

## 2016-08-14 NOTE — Care Management (Signed)
Patient's walker has been delivered to his room

## 2016-08-14 NOTE — Progress Notes (Addendum)
ANTICOAGULATION CONSULT NOTE - Initial Consult  Pharmacy Consult for Heparin  Indication: VTE prophylaxis       Allergies  Allergen Reactions  . Thorazine [Chlorpromazine] Other (See Comments)    Reaction: Unknown , pt states it makes him feel real bad    Patient Measurements: Height: 6\' 1"  (185.4 cm) Weight: 176 lb 8 oz (80.1 kg) IBW/kg (Calculated) : 79.9 Heparin Dosing Weight: 85.7 kg   Vital Signs: Temp: 98.6 F (37 C) (03/13 1104) Temp Source: Oral (03/13 1104) BP: 172/75 (03/13 1107) Pulse Rate: 60 (03/13 1107)  Labs:  HL= 0.60  Medical History:     Past Medical History:  Diagnosis Date  . Anemia   . Diabetes mellitus without complication ([place])   . Dialysis patient [facility])    Mon. -Wed.- Fri  . ESRD (end stage renal disease) ([place])   . Hyperlipidemia   . Malignant hypertension   . Renal artery stenosis ([place])   . Schizophrenia ([place])     Medications:         Prescriptions Prior to Admission  Medication Sig Dispense Refill Last Dose  . amLODipine (NORVASC) 10 MG tablet Take 10 mg by mouth daily.   unknown at unknown  . atenolol (TENORMIN) 25 MG tablet Take 25 mg by mouth 2 (two) times daily.    unknown at unknown  . b complex-vitamin c-folic acid (NEPHRO-VITE) 0.8 MG TABS tablet Take 1 tablet by mouth daily.   unknown at unknown  . calcium acetate (PHOSLO) 667 MG capsule Take 2 capsules (1,334 mg total) by mouth 3 (three) times daily with meals. 60 capsule 1 unknown at unknown  . carbamazepine (TEGRETOL XR) 200 MG 12 hr tablet Take 1,000 mg by mouth every morning.   unknown at unknown  . cholecalciferol (VITAMIN D) 1000 UNITS tablet Take 1,000 Units by mouth daily.   unknown at unknown  . citalopram (CELEXA) 20 MG tablet Take 1 tablet (20 mg total) by mouth daily. 30 tablet 1 unknown at unknown  . cloNIDine (CATAPRES) 0.2 MG tablet Take 1 tablet (0.2 mg total) by mouth 3 (three) times daily. 90 tablet 1 unknown at  unknown  . ferrous sulfate 325 (65 FE) MG tablet Take 1 tablet (325 mg total) by mouth daily. 30 tablet 3 unknown at unknown  . fluPHENAZine (PROLIXIN) 2.5 MG tablet Take 2.5 mg by mouth daily.    unknown at unknown  . Fluticasone-Salmeterol (ADVAIR DISKUS) 250-50 MCG/DOSE AEPB Inhale 1 puff into the lungs 2 (two) times daily.   unknown at unknown  . hydrALAZINE (APRESOLINE) 100 MG tablet Take 1 tablet (100 mg total) by mouth every 6 (six) hours. (Patient taking differently: Take 100 mg by mouth 2 (two) times daily. ) 120 tablet 1 unknown at unknown  . minoxidil (LONITEN) 2.5 MG tablet Take 2 tablets (5 mg total) by mouth daily. 60 tablet 1 unknown at unknown  . trihexyphenidyl (ARTANE) 5 MG tablet Take 5 mg by mouth 3 (three) times daily with meals.   unknown at unknown  . acetaminophen (TYLENOL) 500 MG tablet Take 500 mg by mouth every 4 (four) hours as needed for mild pain. Take 1 tablet every 4-6 hours as needed for pain.   prn at prn  . albuterol (PROAIR HFA) 108 (90 Base) MCG/ACT inhaler Inhale 1-2 puffs into the lungs every 6 (six) hours as needed for wheezing or shortness of breath (cough).   prn at prn  . amoxicillin-clavulanate (AUGMENTIN) 500-125 MG tablet Take 1 tablet (500  mg total) by mouth 3 (three) times daily. 15 tablet 0   . baclofen (LIORESAL) 10 MG tablet Take 1 tablet (10 mg total) by mouth 2 (two) times daily as needed for muscle spasms. 30 each 0 05/25/2016 at Unknown time  . benzonatate (TESSALON) 200 MG capsule Take 1 capsule (200 mg total) by mouth 2 (two) times daily as needed for cough. 20 capsule 0   . calcium acetate (PHOSLO) 667 MG capsule Take 1,334 mg by mouth 3 (three) times daily with meals.    unknown at unknown  . carbamazepine (TEGRETOL) 200 MG tablet Take 2.5 tablets (500 mg total) by mouth 2 (two) times daily. Taken 2 1/2 tabs (500mg  ) twice a day (Patient not taking: Reported on 04/14/2016) 150 tablet 0 Not Taking  . cyclobenzaprine (FLEXERIL) 5 MG  tablet Take 5 mg by mouth at bedtime as needed for muscle spasms.   prn at prn  . fluticasone furoate-vilanterol (BREO ELLIPTA) 100-25 MCG/INH AEPB Inhale 1 puff into the lungs daily.   unknown at unknown  . traMADol (ULTRAM) 50 MG tablet Take 1 tablet (50 mg total) by mouth every 6 (six) hours as needed. 30 tablet 0 unknown at Unknown time    Assessment: Pharmacy consulted to dose heparin in this 56 year old male with VTE.   Goal of Therapy: Heparin level 0.3-0.7 units/ml Monitor platelets by anticoagulation protocol: Yes  Plan: Will continue heparin infusion at current rate and recheck a HL in 24 hours.   Thank you for this consult.  Napoleon Form, PharmD, BCPS Clinical Pharmacist 08/14/2016 12:27 AM

## 2016-08-14 NOTE — Progress Notes (Signed)
Post hd assessment 

## 2016-08-14 NOTE — Progress Notes (Signed)
Post hd vitals 

## 2016-08-14 NOTE — Progress Notes (Signed)
Start of hd 

## 2016-08-14 NOTE — Progress Notes (Signed)
  End of hd 

## 2016-08-14 NOTE — Progress Notes (Signed)
Pre hd assessment  

## 2016-08-14 NOTE — Care Management (Addendum)
Discussed discharge disposition with pharmacy and attending.  Patient's INR must be therapeutic x 2 days before can discharge.  INR today is 1.75.  will project discharge date of 3/18.  Obtained order for rolling walker and asked that it be delivered to patient's room.  Updated Advanced

## 2016-08-14 NOTE — Progress Notes (Signed)
Pre hd info 

## 2016-08-14 NOTE — Progress Notes (Signed)
Hypoglycemic Event  CBG: 57  Treatment:cracker Symptoms: A&O, asymptomatic Follow-up CBG: 2112  CBG Result:116  Comments/MD notified:Hugelmeyer   Todd Brown Todd Brown Todd Brown

## 2016-08-15 LAB — GLUCOSE, CAPILLARY
GLUCOSE-CAPILLARY: 102 mg/dL — AB (ref 65–99)
GLUCOSE-CAPILLARY: 127 mg/dL — AB (ref 65–99)
Glucose-Capillary: 87 mg/dL (ref 65–99)
Glucose-Capillary: 89 mg/dL (ref 65–99)

## 2016-08-15 LAB — HEPARIN LEVEL (UNFRACTIONATED): HEPARIN UNFRACTIONATED: 0.55 [IU]/mL (ref 0.30–0.70)

## 2016-08-15 LAB — PROTIME-INR
INR: 1.82
Prothrombin Time: 21.3 seconds — ABNORMAL HIGH (ref 11.4–15.2)

## 2016-08-15 MED ORDER — LIDOCAINE 5 % EX PTCH
1.0000 | MEDICATED_PATCH | CUTANEOUS | Status: DC
  Administered 2016-08-15 – 2016-08-17 (×3): 1 via TRANSDERMAL
  Filled 2016-08-15 (×4): qty 1

## 2016-08-15 MED ORDER — WARFARIN SODIUM 5 MG PO TABS
8.0000 mg | ORAL_TABLET | Freq: Once | ORAL | Status: AC
  Administered 2016-08-15: 8 mg via ORAL
  Filled 2016-08-15: qty 1

## 2016-08-15 MED ORDER — TRIHEXYPHENIDYL HCL 5 MG PO TABS
5.0000 mg | ORAL_TABLET | Freq: Three times a day (TID) | ORAL | Status: DC
  Administered 2016-08-15 – 2016-08-18 (×9): 5 mg via ORAL
  Filled 2016-08-15 (×10): qty 1

## 2016-08-15 NOTE — Plan of Care (Signed)
Problem: Education: Goal: Knowledge of disease and its progression will improve Outcome: Progressing Patient understands when he must take his Phoslo. Verbalized need to take medication with food.

## 2016-08-15 NOTE — Progress Notes (Signed)
Subjective:  Patient completed dialysis yesterday. Currently resting comfortably. Blood pressure continues to be high.  Objective:  Vital signs in last 24 hours:  Temp:  [98.1 F (36.7 C)-98.6 F (37 C)] 98.1 F (36.7 C) (03/17 1152) Pulse Rate:  [81-93] 83 (03/17 1152) Resp:  [17-18] 18 (03/17 1152) BP: (147-209)/(76-111) 191/111 (03/17 1152) SpO2:  [94 %-100 %] 100 % (03/17 1152) Weight:  [78.8 kg (173 lb 11.2 oz)] 78.8 kg (173 lb 11.2 oz) (03/17 0522)  Weight change: -0.04 kg (-1.4 oz) Filed Weights   08/14/16 0934 08/14/16 1340 08/15/16 0522  Weight: 80.7 kg (177 lb 14.6 oz) 77.5 kg (170 lb 13.7 oz) 78.8 kg (173 lb 11.2 oz)    Intake/Output:    Intake/Output Summary (Last 24 hours) at 08/15/16 1653 Last data filed at 08/15/16 1300  Gross per 24 hour  Intake              666 ml  Output              500 ml  Net              166 ml     Physical Exam: General: Chronically ill appearing  HEENT Anicteric  Neck supple  Pulm/lungs CTAB, normal effort   CVS/Heart Regular rhythm no rubs  Abdomen:  Soft, nontender, nondistended  Extremities: Trace edema  Neurologic: Awake, alert, following commands   Skin: Dry skin  Access: Left upper arm AV fistula       Basic Metabolic Panel:   Recent Labs Lab 08/10/16 0346 08/12/16 0404 08/12/16 1000 08/13/16 0340 08/14/16 0937  NA 138 134*  --  133*  --   K 4.1 4.7  --  4.4  --   CL 99* 96*  --  95*  --   CO2 30 29  --  28  --   GLUCOSE 89 81  --  94  --   BUN 49* 44*  --  37*  --   CREATININE 7.76* 6.56*  --  4.72*  --   CALCIUM 8.6* 9.0  --  9.3  --   PHOS 4.2  --  3.4 3.0 4.1     CBC:  Recent Labs Lab 08/10/16 0346 08/12/16 0404 08/14/16 0409  WBC 6.8 6.1 6.5  HGB 10.9* 10.8* 11.5*  HCT 31.2* 31.0* 33.8*  MCV 106.1* 106.0* 106.4*  PLT 172 198 242      Microbiology:  Recent Results (from the past 720 hour(s))  MRSA PCR Screening     Status: None   Collection Time: 08/03/16  6:09 PM  Result  Value Ref Range Status   MRSA by PCR NEGATIVE NEGATIVE Final    Comment:        The GeneXpert MRSA Assay (FDA approved for NASAL specimens only), is one component of a comprehensive MRSA colonization surveillance program. It is not intended to diagnose MRSA infection nor to guide or monitor treatment for MRSA infections.     Coagulation Studies:  Recent Labs  08/13/16 0340 08/14/16 0409 08/15/16 0831  LABPROT 21.2* 20.7* 21.3*  INR 1.81 1.75 1.82    Urinalysis: No results for input(s): COLORURINE, LABSPEC, PHURINE, GLUCOSEU, HGBUR, BILIRUBINUR, KETONESUR, PROTEINUR, UROBILINOGEN, NITRITE, LEUKOCYTESUR in the last 72 hours.  Invalid input(s): APPERANCEUR    Imaging: No results found.   Medications:   . heparin 1,700 Units/hr (08/15/16 0839)   . amLODipine  10 mg Oral Daily  . calcium acetate  1,334 mg Oral TID WC  .  carbamazepine  500 mg Oral BID  . cloNIDine  0.2 mg Oral TID  . docusate sodium  100 mg Oral Daily  . epoetin (EPOGEN/PROCRIT) injection  4,000 Units Intravenous Q M,W,F-HD  . feeding supplement (NEPRO CARB STEADY)  237 mL Oral BID BM  . fluPHENAZine  2.5 mg Oral Daily  . hydrALAZINE  100 mg Oral Q6H  . Influenza vac split quadrivalent PF  0.5 mL Intramuscular Tomorrow-1000  . insulin aspart  0-15 Units Subcutaneous TID WC  . insulin aspart  0-5 Units Subcutaneous QHS  . lidocaine  1 patch Transdermal Q24H  . minoxidil  10 mg Oral Daily  . pneumococcal 23 valent vaccine  0.5 mL Intramuscular Tomorrow-1000  . trihexyphenidyl  5 mg Oral TID WC  . warfarin  8 mg Oral ONCE-1800  . Warfarin - Pharmacist Dosing Inpatient   Does not apply q1800   acetaminophen, hydrALAZINE, HYDROcodone-acetaminophen, ipratropium-albuterol, morphine injection, nitroGLYCERIN  Assessment/ Plan:  56 y.o.african American male  With schizophrenia, hypertension, hepatitis C, tobacco abuse, anemia of chronic kidney disease, secondary hyperparathyroidism, end-stage renal  disease  [facility] MWF [place].   1.  End-stage renal disease on HD MWF 2.  Severe hyperkalemia, life threatening, had cardiac arrest with ACLS protocol. 3.  Acute respiratory failure, extubated 3/6 4.  Anemia of chronic kidney disease. Hgb 11.5 5.  RUE DVT, started on anticoagulation. 6.  Secondary hyperparathyroidism. Phos 4.1.  -   Patient appears to be doing well at the moment. He completed dialysis yesterday. No acute indication for dialysis today. We will plan for dialysis again on Monday if still here. Hyperkalemia has been improved. His secondary hyperparathyroidism is also well-controlled with a serum phosphorus of 4.1. Hemoglobin at target at 11.5. Continue supportive care at this time.     LOS: 12 Eoghan Belcher 3/17/20184:53 PM

## 2016-08-15 NOTE — Progress Notes (Signed)
Pt was to have labs drawn and pt refusing.  Explained to pt the reason for have the lab work done. Pt is refusing for this shift, but stated that they can draw labs on first shift.

## 2016-08-15 NOTE — Progress Notes (Signed)
ANTICOAGULATION CONSULT NOTE - Initial Consult  Pharmacy Consult for warfarin Indication: DVT       Allergies  Allergen Reactions  . Thorazine [Chlorpromazine] Other (See Comments)    Reaction: Unknown , pt states it makes him feel real bad    Patient Measurements: Height: 6\' 1"  (185.4 cm) Weight: 176 lb 8 oz (80.1 kg) IBW/kg (Calculated) : 79.9  Vital Signs: Temp: 98.6 F (37 C) (03/13 1104) Temp Source: Oral (03/13 1104) BP: 172/75 (03/13 1107) Pulse Rate: 60 (03/13 1107)  Labs:  Recent Labs (last 2 labs)    Recent Labs  08/08/16 1345 08/09/16 0512 08/09/16 1325 08/10/16 0346  HGB 10.9* --  --  10.9*  HCT 31.2* --  --  31.2*  PLT 162 --  --  172  LABPROT --  --  12.9 12.9  INR --  --  0.97 0.97  HEPARINUNFRC 0.48 0.46 --  0.35  CREATININE --  --  --  7.76*      Estimated Creatinine Clearance: 12.2 mL/min (by C-G formula based on SCr of 7.76 mg/dL (H)).   Medical History:     Past Medical History:  Diagnosis Date  . Anemia   . Diabetes mellitus without complication ([place])   . Dialysis patient [facility])    Mon. -Wed.- Fri  . ESRD (end stage renal disease) ([place])   . Hyperlipidemia   . Malignant hypertension   . Renal artery stenosis ([place])   . Schizophrenia ([place])    Medications:         Prescriptions Prior to Admission  Medication Sig Dispense Refill Last Dose  . amLODipine (NORVASC) 10 MG tablet Take 10 mg by mouth daily.   unknown at unknown  . atenolol (TENORMIN) 25 MG tablet Take 25 mg by mouth 2 (two) times daily.    unknown at unknown  . b complex-vitamin c-folic acid (NEPHRO-VITE) 0.8 MG TABS tablet Take 1 tablet by mouth daily.   unknown at unknown  . calcium acetate (PHOSLO) 667 MG capsule Take 2 capsules (1,334 mg total) by mouth 3 (three) times daily with meals. 60 capsule 1 unknown at unknown  . carbamazepine (TEGRETOL XR) 200 MG 12 hr tablet Take 1,000 mg by mouth every morning.    unknown at unknown  . cholecalciferol (VITAMIN D) 1000 UNITS tablet Take 1,000 Units by mouth daily.   unknown at unknown  . citalopram (CELEXA) 20 MG tablet Take 1 tablet (20 mg total) by mouth daily. 30 tablet 1 unknown at unknown  . cloNIDine (CATAPRES) 0.2 MG tablet Take 1 tablet (0.2 mg total) by mouth 3 (three) times daily. 90 tablet 1 unknown at unknown  . ferrous sulfate 325 (65 FE) MG tablet Take 1 tablet (325 mg total) by mouth daily. 30 tablet 3 unknown at unknown  . fluPHENAZine (PROLIXIN) 2.5 MG tablet Take 2.5 mg by mouth daily.    unknown at unknown  . Fluticasone-Salmeterol (ADVAIR DISKUS) 250-50 MCG/DOSE AEPB Inhale 1 puff into the lungs 2 (two) times daily.   unknown at unknown  . hydrALAZINE (APRESOLINE) 100 MG tablet Take 1 tablet (100 mg total) by mouth every 6 (six) hours. (Patient taking differently: Take 100 mg by mouth 2 (two) times daily. ) 120 tablet 1 unknown at unknown  . minoxidil (LONITEN) 2.5 MG tablet Take 2 tablets (5 mg total) by mouth daily. 60 tablet 1 unknown at unknown  . trihexyphenidyl (ARTANE) 5 MG tablet Take 5 mg by mouth 3 (three) times daily with meals.  unknown at unknown  . acetaminophen (TYLENOL) 500 MG tablet Take 500 mg by mouth every 4 (four) hours as needed for mild pain. Take 1 tablet every 4-6 hours as needed for pain.   prn at prn  . albuterol (PROAIR HFA) 108 (90 Base) MCG/ACT inhaler Inhale 1-2 puffs into the lungs every 6 (six) hours as needed for wheezing or shortness of breath (cough).   prn at prn  . amoxicillin-clavulanate (AUGMENTIN) 500-125 MG tablet Take 1 tablet (500 mg total) by mouth 3 (three) times daily. 15 tablet 0   . baclofen (LIORESAL) 10 MG tablet Take 1 tablet (10 mg total) by mouth 2 (two) times daily as needed for muscle spasms. 30 each 0 05/25/2016 at Unknown time  . benzonatate (TESSALON) 200 MG capsule Take 1 capsule (200 mg total) by mouth 2 (two) times daily as needed for cough. 20 capsule 0   . calcium  acetate (PHOSLO) 667 MG capsule Take 1,334 mg by mouth 3 (three) times daily with meals.    unknown at unknown  . carbamazepine (TEGRETOL) 200 MG tablet Take 2.5 tablets (500 mg total) by mouth 2 (two) times daily. Taken 2 1/2 tabs (500mg  ) twice a day (Patient not taking: Reported on 04/14/2016) 150 tablet 0 Not Taking  . cyclobenzaprine (FLEXERIL) 5 MG tablet Take 5 mg by mouth at bedtime as needed for muscle spasms.   prn at prn  . fluticasone furoate-vilanterol (BREO ELLIPTA) 100-25 MCG/INH AEPB Inhale 1 puff into the lungs daily.   unknown at unknown  . traMADol (ULTRAM) 50 MG tablet Take 1 tablet (50 mg total) by mouth every 6 (six) hours as needed. 30 tablet 0 unknown at Unknown time   Scheduled:  . amLODipine 10 mg Oral Daily  . calcium acetate 1,334 mg Oral TID WC  . carbamazepine 500 mg Oral BID  . cloNIDine 0.2 mg Oral TID  . docusate sodium 100 mg Oral Daily  . epoetin (EPOGEN/PROCRIT) injection 4,000 Units Intravenous Q M,W,F-HD  . feeding supplement (NEPRO CARB STEADY) 237 mL Oral BID BM  . fluPHENAZine 2.5 mg Oral Daily  . hydrALAZINE 100 mg Oral Q8H  . Influenza vac split quadrivalent PF 0.5 mL Intramuscular Tomorrow-1000  . insulin aspart 0-15 Units Subcutaneous TID WC  . insulin aspart 0-5 Units Subcutaneous QHS  . minoxidil 5 mg Oral Daily  . pneumococcal 23 valent vaccine 0.5 mL Intramuscular Tomorrow-1000  . warfarin 10 mg Oral ONCE-1800  . Warfarin - Pharmacist Dosing Inpatient  Does not apply q1800   Infusions:  . heparin 1,500 Units/hr (08/11/16 1779)    Assessment: Pharmacy consulted to dose and monitor warfarin in this 56 year old male for treatment of a right upper extremity DVT. Patient also has ESRD on HD and has been anticoagulated with a heparin drip.   Patient not eligible for DOAC due to contraindicated drug interaction with carbamazepine. Patient is being started on warfarin today with heparin bridge.   Goal of  Therapy: INR 2-3 Monitor platelets by anticoagulation protocol: Yes  Plan: 3/11 INR 0.97  warfarin 7.5mg  3/12 INR 0.97  warfarin 7.5mg  3/13 warfarin 10 mg 3/14 INR 1.59 warfarin 7.5 mg 3/15 INR 1.81  Warfarin 7.5mg  3/16 INR 1.75  3/16 INR dropped slightly. Will give 10mg  tonight. Recheck INR in the AM.  3/17 Patient doesn't want HL drawn now because he doesn't like the 3rd shift phlebs --talked to his RN and she said he is okay with getting labs drawn with 1st shift  so rescheduled HL for 3/17 @ 0800.  Tobie Lords, PharmD, BCPS Clinical Pharmacist 08/15/2016

## 2016-08-15 NOTE — Progress Notes (Signed)
ANTICOAGULATION CONSULT NOTE -   Pharmacy Consult for Heparin  Indication: VTE prophylaxis       Allergies  Allergen Reactions  . Thorazine [Chlorpromazine] Other (See Comments)    Reaction: Unknown , pt states it makes him feel real bad    Patient Measurements: Height: 6\' 1"  (185.4 cm) Weight: 176 lb 8 oz (80.1 kg) IBW/kg (Calculated) : 79.9 Heparin Dosing Weight: 85.7 kg   Vital Signs: Temp: 98.6 F (37 C) (03/13 1104) Temp Source: Oral (03/13 1104) BP: 172/75 (03/13 1107) Pulse Rate: 60 (03/13 1107)  Labs:  HL= 0.60  Medical History:     Past Medical History:  Diagnosis Date  . Anemia   . Diabetes mellitus without complication ([place])   . Dialysis patient [facility])    Mon. -Wed.- Fri  . ESRD (end stage renal disease) ([place])   . Hyperlipidemia   . Malignant hypertension   . Renal artery stenosis ([place])   . Schizophrenia ([place])     Medications:         Prescriptions Prior to Admission  Medication Sig Dispense Refill Last Dose  . amLODipine (NORVASC) 10 MG tablet Take 10 mg by mouth daily.   unknown at unknown  . atenolol (TENORMIN) 25 MG tablet Take 25 mg by mouth 2 (two) times daily.    unknown at unknown  . b complex-vitamin c-folic acid (NEPHRO-VITE) 0.8 MG TABS tablet Take 1 tablet by mouth daily.   unknown at unknown  . calcium acetate (PHOSLO) 667 MG capsule Take 2 capsules (1,334 mg total) by mouth 3 (three) times daily with meals. 60 capsule 1 unknown at unknown  . carbamazepine (TEGRETOL XR) 200 MG 12 hr tablet Take 1,000 mg by mouth every morning.   unknown at unknown  . cholecalciferol (VITAMIN D) 1000 UNITS tablet Take 1,000 Units by mouth daily.   unknown at unknown  . citalopram (CELEXA) 20 MG tablet Take 1 tablet (20 mg total) by mouth daily. 30 tablet 1 unknown at unknown  . cloNIDine (CATAPRES) 0.2 MG tablet Take 1 tablet (0.2 mg total) by mouth 3 (three) times daily. 90 tablet 1 unknown at unknown  .  ferrous sulfate 325 (65 FE) MG tablet Take 1 tablet (325 mg total) by mouth daily. 30 tablet 3 unknown at unknown  . fluPHENAZine (PROLIXIN) 2.5 MG tablet Take 2.5 mg by mouth daily.    unknown at unknown  . Fluticasone-Salmeterol (ADVAIR DISKUS) 250-50 MCG/DOSE AEPB Inhale 1 puff into the lungs 2 (two) times daily.   unknown at unknown  . hydrALAZINE (APRESOLINE) 100 MG tablet Take 1 tablet (100 mg total) by mouth every 6 (six) hours. (Patient taking differently: Take 100 mg by mouth 2 (two) times daily. ) 120 tablet 1 unknown at unknown  . minoxidil (LONITEN) 2.5 MG tablet Take 2 tablets (5 mg total) by mouth daily. 60 tablet 1 unknown at unknown  . trihexyphenidyl (ARTANE) 5 MG tablet Take 5 mg by mouth 3 (three) times daily with meals.   unknown at unknown  . acetaminophen (TYLENOL) 500 MG tablet Take 500 mg by mouth every 4 (four) hours as needed for mild pain. Take 1 tablet every 4-6 hours as needed for pain.   prn at prn  . albuterol (PROAIR HFA) 108 (90 Base) MCG/ACT inhaler Inhale 1-2 puffs into the lungs every 6 (six) hours as needed for wheezing or shortness of breath (cough).   prn at prn  . amoxicillin-clavulanate (AUGMENTIN) 500-125 MG tablet Take 1 tablet (500 mg  total) by mouth 3 (three) times daily. 15 tablet 0   . baclofen (LIORESAL) 10 MG tablet Take 1 tablet (10 mg total) by mouth 2 (two) times daily as needed for muscle spasms. 30 each 0 05/25/2016 at Unknown time  . benzonatate (TESSALON) 200 MG capsule Take 1 capsule (200 mg total) by mouth 2 (two) times daily as needed for cough. 20 capsule 0   . calcium acetate (PHOSLO) 667 MG capsule Take 1,334 mg by mouth 3 (three) times daily with meals.    unknown at unknown  . carbamazepine (TEGRETOL) 200 MG tablet Take 2.5 tablets (500 mg total) by mouth 2 (two) times daily. Taken 2 1/2 tabs (500mg  ) twice a day (Patient not taking: Reported on 04/14/2016) 150 tablet 0 Not Taking  . cyclobenzaprine (FLEXERIL) 5 MG tablet Take  5 mg by mouth at bedtime as needed for muscle spasms.   prn at prn  . fluticasone furoate-vilanterol (BREO ELLIPTA) 100-25 MCG/INH AEPB Inhale 1 puff into the lungs daily.   unknown at unknown  . traMADol (ULTRAM) 50 MG tablet Take 1 tablet (50 mg total) by mouth every 6 (six) hours as needed. 30 tablet 0 unknown at Unknown time    Assessment: Pharmacy consulted to dose heparin in this 56 year old male with VTE.   Goal of Therapy: Heparin level 0.3-0.7 units/ml Monitor platelets by anticoagulation protocol: Yes  Plan: 3/17: HL=0.55. Will continue heparin infusion at current rate and recheck a HL with am labs. Patient also started on Warfarin.   Thank you for this consult.  Noralee Space, PharmD, BCPS Clinical Pharmacist 08/15/2016 9:46 AM

## 2016-08-15 NOTE — Progress Notes (Signed)
[place] [facility] [place] NAME: Todd Brown    MR#:  [id number]  DATE OF BIRTH:  1960-07-25  SUBJECTIVE:  CHIEF COMPLAINT:   Chief Complaint  Patient presents with  . Weakness   - Status post cardiac arrest after hyperkalemia. Dialysis patient and doing well. -Complains of muscular chest pain from his compressions. INR is at 1.8  REVIEW OF SYSTEMS:  Review of Systems  Constitutional: Negative for chills, fever and malaise/fatigue.  HENT: Negative for congestion, ear discharge, hearing loss and nosebleeds.   Eyes: Negative for blurred vision and double vision.  Respiratory: Negative for cough, shortness of breath and wheezing.   Cardiovascular: Positive for chest pain. Negative for palpitations and leg swelling.  Gastrointestinal: Negative for abdominal pain, constipation, diarrhea, nausea and vomiting.  Genitourinary: Negative for dysuria.  Musculoskeletal: Positive for myalgias. Negative for back pain.  Neurological: Negative for dizziness, sensory change, speech change, focal weakness, seizures and headaches.  Psychiatric/Behavioral: Negative for depression.    DRUG ALLERGIES:   Allergies  Allergen Reactions  . Thorazine [Chlorpromazine] Other (See Comments)    Reaction:  Unknown , pt states it makes him feel real bad    VITALS:  Blood pressure (!) 191/111, pulse 83, temperature 98.1 F (36.7 C), temperature source Oral, resp. rate 18, height 6\' 1"  (1.854 m), weight 78.8 kg (173 lb 11.2 oz), SpO2 100 %.  PHYSICAL EXAMINATION:  Physical Exam  GENERAL:  56 y.o.-year-old patient lying in the bed with no acute distress. Chronically ill-appearing EYES: Pupils equal, round, reactive to light and accommodation. No scleral icterus. Extraocular muscles intact.  HEENT: Head atraumatic, normocephalic. Oropharynx and nasopharynx clear. No teeth present NECK:  Supple, no jugular venous distention. No thyroid enlargement, no tenderness.    LUNGS: Normal breath sounds bilaterally, no wheezing, rales,rhonchi or crepitation. No use of accessory muscles of respiration. Decreased bibasilar breath sounds  CARDIOVASCULAR: S1, S2 normal. No murmurs, rubs, or gallops.  ABDOMEN: Soft, nontender, nondistended. Bowel sounds present. No organomegaly or mass.  EXTREMITIES: No pedal edema, cyanosis, or clubbing. Left arm dialysis catheter NEUROLOGIC: Cranial nerves II through XII are intact. Muscle strength 5/5 in all extremities. Sensation intact. Gait not checked.  PSYCHIATRIC: The patient is alert and oriented x 3. Masked face SKIN: No obvious rash, lesion, or ulcer.    LABORATORY PANEL:   CBC  Recent Labs Lab 08/14/16 0409  WBC 6.5  HGB 11.5*  HCT 33.8*  PLT 242   ------------------------------------------------------------------------------------------------------------------  Chemistries   Recent Labs Lab 08/13/16 0340  NA 133*  K 4.4  CL 95*  CO2 28  GLUCOSE 94  BUN 37*  CREATININE 4.72*  CALCIUM 9.3   ------------------------------------------------------------------------------------------------------------------  Cardiac Enzymes No results for input(s): TROPONINI in the last 168 hours. ------------------------------------------------------------------------------------------------------------------  RADIOLOGY:  No results found.  EKG:   Orders placed or performed during the hospital encounter of 08/03/16  . EKG 12-Lead  . EKG 12-Lead  . EKG 12-Lead  . EKG 12-Lead    ASSESSMENT AND PLAN:   56 year old African-American male with past medical history significant for schizophrenia, end-stage renal disease on Monday Wednesday Friday hemodialysis, diabetes mellitus, hypertension, hepatitis C, anemia of chronic disease presents to hospital with cardiac arrest.  #1 acute right occlusive cephalic and basilic vein thrombosis-continue heparin drip. Started on Coumadin. -INR is still subtherapeutic at  1.8 -Pharmacy adjusting Coumadin dose.  #2 End stage renal disease on Monday Wednesday Friday hemodialysis-paralysis per schedule. Next dialysis on Monday. -Appreciate  nephrology consult  #3 cardiac arrests-secondary to hyperkalemia. Patient had respiratory failure secondary to cardiac arrest with prolonged resuscitation. -Appreciate intensivist consult. Patient doing well at this time. Potassium regulated via dialysis. -Dietary potassium restriction educated. -Breathing on room air. Continue incentive spirometry.  #4 history of schizophrenia-continue home medications.  #5 hypertension-continue clonidine, oral hydralazine, minoxidil and Norvasc.  #6 anemia of chronic disease-on  Epogen injections with dialysis  #7 DVT prophylaxis-patient on heparin drip and Coumadin   Physical therapy consulted and they have recommended home health at discharge    All the records are reviewed and case discussed with Care Management/Social Workerr. Management plans discussed with the patient, family and they are in agreement.  CODE STATUS: Full code  TOTAL TIME TAKING CARE OF THIS PATIENT: 38 minutes.   POSSIBLE D/C IN 2 DAYS, DEPENDING ON CLINICAL CONDITION.   Gladstone Lighter M.D on 08/15/2016 at 12:36 PM  Between 7am to 6pm - Pager - [phone]  After 6pm go to [email or web address] - password [email or web address] [facility]  Office  [phone]  CC: Primary care physician; Nino Glow McLean-Scocuzza, MD

## 2016-08-15 NOTE — Progress Notes (Signed)
ANTICOAGULATION CONSULT NOTE -  Pharmacy Consult for warfarin Indication: DVT       Allergies  Allergen Reactions  . Thorazine [Chlorpromazine] Other (See Comments)    Reaction: Unknown , pt states it makes him feel real bad    Patient Measurements: Height: 6\' 1"  (185.4 cm) Weight: 176 lb 8 oz (80.1 kg) IBW/kg (Calculated) : 79.9  Vital Signs: Temp: 98.6 F (37 C) (03/13 1104) Temp Source: Oral (03/13 1104) BP: 172/75 (03/13 1107) Pulse Rate: 60 (03/13 1107)  Labs:  Recent Labs (last 2 labs)    Recent Labs  08/08/16 1345 08/09/16 0512 08/09/16 1325 08/10/16 0346  HGB 10.9* --  --  10.9*  HCT 31.2* --  --  31.2*  PLT 162 --  --  172  LABPROT --  --  12.9 12.9  INR --  --  0.97 0.97  HEPARINUNFRC 0.48 0.46 --  0.35  CREATININE --  --  --  7.76*      Estimated Creatinine Clearance: 12.2 mL/min (by C-G formula based on SCr of 7.76 mg/dL (H)).   Medical History:     Past Medical History:  Diagnosis Date  . Anemia   . Diabetes mellitus without complication ([place])   . Dialysis patient [facility])    Mon. -Wed.- Fri  . ESRD (end stage renal disease) ([place])   . Hyperlipidemia   . Malignant hypertension   . Renal artery stenosis ([place])   . Schizophrenia ([place])    Medications:         Prescriptions Prior to Admission  Medication Sig Dispense Refill Last Dose  . amLODipine (NORVASC) 10 MG tablet Take 10 mg by mouth daily.   unknown at unknown  . atenolol (TENORMIN) 25 MG tablet Take 25 mg by mouth 2 (two) times daily.    unknown at unknown  . b complex-vitamin c-folic acid (NEPHRO-VITE) 0.8 MG TABS tablet Take 1 tablet by mouth daily.   unknown at unknown  . calcium acetate (PHOSLO) 667 MG capsule Take 2 capsules (1,334 mg total) by mouth 3 (three) times daily with meals. 60 capsule 1 unknown at unknown  . carbamazepine (TEGRETOL XR) 200 MG 12 hr tablet Take 1,000 mg by mouth every morning.   unknown at  unknown  . cholecalciferol (VITAMIN D) 1000 UNITS tablet Take 1,000 Units by mouth daily.   unknown at unknown  . citalopram (CELEXA) 20 MG tablet Take 1 tablet (20 mg total) by mouth daily. 30 tablet 1 unknown at unknown  . cloNIDine (CATAPRES) 0.2 MG tablet Take 1 tablet (0.2 mg total) by mouth 3 (three) times daily. 90 tablet 1 unknown at unknown  . ferrous sulfate 325 (65 FE) MG tablet Take 1 tablet (325 mg total) by mouth daily. 30 tablet 3 unknown at unknown  . fluPHENAZine (PROLIXIN) 2.5 MG tablet Take 2.5 mg by mouth daily.    unknown at unknown  . Fluticasone-Salmeterol (ADVAIR DISKUS) 250-50 MCG/DOSE AEPB Inhale 1 puff into the lungs 2 (two) times daily.   unknown at unknown  . hydrALAZINE (APRESOLINE) 100 MG tablet Take 1 tablet (100 mg total) by mouth every 6 (six) hours. (Patient taking differently: Take 100 mg by mouth 2 (two) times daily. ) 120 tablet 1 unknown at unknown  . minoxidil (LONITEN) 2.5 MG tablet Take 2 tablets (5 mg total) by mouth daily. 60 tablet 1 unknown at unknown  . trihexyphenidyl (ARTANE) 5 MG tablet Take 5 mg by mouth 3 (three) times daily with meals.   unknown  at unknown  . acetaminophen (TYLENOL) 500 MG tablet Take 500 mg by mouth every 4 (four) hours as needed for mild pain. Take 1 tablet every 4-6 hours as needed for pain.   prn at prn  . albuterol (PROAIR HFA) 108 (90 Base) MCG/ACT inhaler Inhale 1-2 puffs into the lungs every 6 (six) hours as needed for wheezing or shortness of breath (cough).   prn at prn  . amoxicillin-clavulanate (AUGMENTIN) 500-125 MG tablet Take 1 tablet (500 mg total) by mouth 3 (three) times daily. 15 tablet 0   . baclofen (LIORESAL) 10 MG tablet Take 1 tablet (10 mg total) by mouth 2 (two) times daily as needed for muscle spasms. 30 each 0 05/25/2016 at Unknown time  . benzonatate (TESSALON) 200 MG capsule Take 1 capsule (200 mg total) by mouth 2 (two) times daily as needed for cough. 20 capsule 0   . calcium acetate  (PHOSLO) 667 MG capsule Take 1,334 mg by mouth 3 (three) times daily with meals.    unknown at unknown  . carbamazepine (TEGRETOL) 200 MG tablet Take 2.5 tablets (500 mg total) by mouth 2 (two) times daily. Taken 2 1/2 tabs (500mg  ) twice a day (Patient not taking: Reported on 04/14/2016) 150 tablet 0 Not Taking  . cyclobenzaprine (FLEXERIL) 5 MG tablet Take 5 mg by mouth at bedtime as needed for muscle spasms.   prn at prn  . fluticasone furoate-vilanterol (BREO ELLIPTA) 100-25 MCG/INH AEPB Inhale 1 puff into the lungs daily.   unknown at unknown  . traMADol (ULTRAM) 50 MG tablet Take 1 tablet (50 mg total) by mouth every 6 (six) hours as needed. 30 tablet 0 unknown at Unknown time   Scheduled:  . amLODipine 10 mg Oral Daily  . calcium acetate 1,334 mg Oral TID WC  . carbamazepine 500 mg Oral BID  . cloNIDine 0.2 mg Oral TID  . docusate sodium 100 mg Oral Daily  . epoetin (EPOGEN/PROCRIT) injection 4,000 Units Intravenous Q M,W,F-HD  . feeding supplement (NEPRO CARB STEADY) 237 mL Oral BID BM  . fluPHENAZine 2.5 mg Oral Daily  . hydrALAZINE 100 mg Oral Q8H  . Influenza vac split quadrivalent PF 0.5 mL Intramuscular Tomorrow-1000  . insulin aspart 0-15 Units Subcutaneous TID WC  . insulin aspart 0-5 Units Subcutaneous QHS  . minoxidil 5 mg Oral Daily  . pneumococcal 23 valent vaccine 0.5 mL Intramuscular Tomorrow-1000  . warfarin 10 mg Oral ONCE-1800  . Warfarin - Pharmacist Dosing Inpatient  Does not apply q1800   Infusions:  . heparin 1,500 Units/hr (08/11/16 2094)    Assessment: Pharmacy consulted to dose and monitor warfarin in this 56 year old male for treatment of a right upper extremity DVT. Patient also has ESRD on HD and has been anticoagulated with a heparin drip.   Patient not eligible for DOAC due to contraindicated drug interaction with carbamazepine. Patient is being started on warfarin today with heparin bridge.   Goal of Therapy: INR  2-3 Monitor platelets by anticoagulation protocol: Yes  Plan: 3/11 INR 0.97  warfarin 7.5mg  3/12 INR 0.97  warfarin 7.5mg  3/13 warfarin 10 mg 3/14 INR 1.59 warfarin 7.5 mg 3/15 INR 1.81  Warfarin 7.5mg  3/16 INR 1.75  Warfarin 10 mg 3/17 INR 1.82   .  3/17 Patient doesn't want HL drawn now because he doesn't like the 3rd shift phlebs --talked to his RN and she said he is okay with getting labs drawn with 1st shift so rescheduled HL for 3/17 @  0800.  3/17: Will give Warfarin 8 mg x 1. Patient on carbamazepine. Patient being bridged with Heparin drip.  Chinita Greenland PharmD Clinical Pharmacist 08/15/2016

## 2016-08-16 LAB — GLUCOSE, CAPILLARY
GLUCOSE-CAPILLARY: 103 mg/dL — AB (ref 65–99)
Glucose-Capillary: 123 mg/dL — ABNORMAL HIGH (ref 65–99)
Glucose-Capillary: 95 mg/dL (ref 65–99)
Glucose-Capillary: 116 mg/dL — ABNORMAL HIGH (ref 65–99)

## 2016-08-16 LAB — PROTIME-INR
INR: 2.34
Prothrombin Time: 26.1 seconds — ABNORMAL HIGH (ref 11.4–15.2)

## 2016-08-16 LAB — HEPARIN LEVEL (UNFRACTIONATED): HEPARIN UNFRACTIONATED: 0.66 [IU]/mL (ref 0.30–0.70)

## 2016-08-16 MED ORDER — WARFARIN SODIUM 5 MG PO TABS
7.5000 mg | ORAL_TABLET | Freq: Once | ORAL | Status: AC
  Administered 2016-08-16: 7.5 mg via ORAL
  Filled 2016-08-16: qty 2

## 2016-08-16 MED ORDER — CYCLOBENZAPRINE HCL 10 MG PO TABS
5.0000 mg | ORAL_TABLET | Freq: Two times a day (BID) | ORAL | Status: AC
  Administered 2016-08-16 – 2016-08-17 (×4): 5 mg via ORAL
  Filled 2016-08-16 (×4): qty 1

## 2016-08-16 MED ORDER — LOSARTAN POTASSIUM 50 MG PO TABS
50.0000 mg | ORAL_TABLET | Freq: Every day | ORAL | Status: DC
  Administered 2016-08-16: 50 mg via ORAL
  Filled 2016-08-16: qty 1

## 2016-08-16 MED ORDER — OXYCODONE-ACETAMINOPHEN 7.5-325 MG PO TABS
1.0000 | ORAL_TABLET | ORAL | Status: DC | PRN
  Administered 2016-08-16 – 2016-08-18 (×10): 1 via ORAL
  Filled 2016-08-16 (×10): qty 1

## 2016-08-16 NOTE — Progress Notes (Signed)
ANTICOAGULATION CONSULT NOTE -  Pharmacy Consult for warfarin Indication: DVT       Allergies  Allergen Reactions  . Thorazine [Chlorpromazine] Other (See Comments)    Reaction: Unknown , pt states it makes him feel real bad    Patient Measurements: Height: 6\' 1"  (185.4 cm) Weight: 176 lb 8 oz (80.1 kg) IBW/kg (Calculated) : 79.9  Vital Signs: Temp: 98.6 F (37 C) (03/13 1104) Temp Source: Oral (03/13 1104) BP: 172/75 (03/13 1107) Pulse Rate: 60 (03/13 1107)  Labs:  Recent Labs (last 2 labs)    Recent Labs  08/08/16 1345 08/09/16 0512 08/09/16 1325 08/10/16 0346  HGB 10.9* --  --  10.9*  HCT 31.2* --  --  31.2*  PLT 162 --  --  172  LABPROT --  --  12.9 12.9  INR --  --  0.97 0.97  HEPARINUNFRC 0.48 0.46 --  0.35  CREATININE --  --  --  7.76*      Estimated Creatinine Clearance: 12.2 mL/min (by C-G formula based on SCr of 7.76 mg/dL (H)).   Medical History:     Past Medical History:  Diagnosis Date  . Anemia   . Diabetes mellitus without complication ([place])   . Dialysis patient [facility])    Mon. -Wed.- Fri  . ESRD (end stage renal disease) ([place])   . Hyperlipidemia   . Malignant hypertension   . Renal artery stenosis ([place])   . Schizophrenia ([place])    Medications:         Prescriptions Prior to Admission  Medication Sig Dispense Refill Last Dose  . amLODipine (NORVASC) 10 MG tablet Take 10 mg by mouth daily.   unknown at unknown  . atenolol (TENORMIN) 25 MG tablet Take 25 mg by mouth 2 (two) times daily.    unknown at unknown  . b complex-vitamin c-folic acid (NEPHRO-VITE) 0.8 MG TABS tablet Take 1 tablet by mouth daily.   unknown at unknown  . calcium acetate (PHOSLO) 667 MG capsule Take 2 capsules (1,334 mg total) by mouth 3 (three) times daily with meals. 60 capsule 1 unknown at unknown  . carbamazepine (TEGRETOL XR) 200 MG 12 hr tablet Take 1,000 mg by mouth every morning.   unknown at  unknown  . cholecalciferol (VITAMIN D) 1000 UNITS tablet Take 1,000 Units by mouth daily.   unknown at unknown  . citalopram (CELEXA) 20 MG tablet Take 1 tablet (20 mg total) by mouth daily. 30 tablet 1 unknown at unknown  . cloNIDine (CATAPRES) 0.2 MG tablet Take 1 tablet (0.2 mg total) by mouth 3 (three) times daily. 90 tablet 1 unknown at unknown  . ferrous sulfate 325 (65 FE) MG tablet Take 1 tablet (325 mg total) by mouth daily. 30 tablet 3 unknown at unknown  . fluPHENAZine (PROLIXIN) 2.5 MG tablet Take 2.5 mg by mouth daily.    unknown at unknown  . Fluticasone-Salmeterol (ADVAIR DISKUS) 250-50 MCG/DOSE AEPB Inhale 1 puff into the lungs 2 (two) times daily.   unknown at unknown  . hydrALAZINE (APRESOLINE) 100 MG tablet Take 1 tablet (100 mg total) by mouth every 6 (six) hours. (Patient taking differently: Take 100 mg by mouth 2 (two) times daily. ) 120 tablet 1 unknown at unknown  . minoxidil (LONITEN) 2.5 MG tablet Take 2 tablets (5 mg total) by mouth daily. 60 tablet 1 unknown at unknown  . trihexyphenidyl (ARTANE) 5 MG tablet Take 5 mg by mouth 3 (three) times daily with meals.   unknown  at unknown  . acetaminophen (TYLENOL) 500 MG tablet Take 500 mg by mouth every 4 (four) hours as needed for mild pain. Take 1 tablet every 4-6 hours as needed for pain.   prn at prn  . albuterol (PROAIR HFA) 108 (90 Base) MCG/ACT inhaler Inhale 1-2 puffs into the lungs every 6 (six) hours as needed for wheezing or shortness of breath (cough).   prn at prn  . amoxicillin-clavulanate (AUGMENTIN) 500-125 MG tablet Take 1 tablet (500 mg total) by mouth 3 (three) times daily. 15 tablet 0   . baclofen (LIORESAL) 10 MG tablet Take 1 tablet (10 mg total) by mouth 2 (two) times daily as needed for muscle spasms. 30 each 0 05/25/2016 at Unknown time  . benzonatate (TESSALON) 200 MG capsule Take 1 capsule (200 mg total) by mouth 2 (two) times daily as needed for cough. 20 capsule 0   . calcium acetate  (PHOSLO) 667 MG capsule Take 1,334 mg by mouth 3 (three) times daily with meals.    unknown at unknown  . carbamazepine (TEGRETOL) 200 MG tablet Take 2.5 tablets (500 mg total) by mouth 2 (two) times daily. Taken 2 1/2 tabs (500mg  ) twice a day (Patient not taking: Reported on 04/14/2016) 150 tablet 0 Not Taking  . cyclobenzaprine (FLEXERIL) 5 MG tablet Take 5 mg by mouth at bedtime as needed for muscle spasms.   prn at prn  . fluticasone furoate-vilanterol (BREO ELLIPTA) 100-25 MCG/INH AEPB Inhale 1 puff into the lungs daily.   unknown at unknown  . traMADol (ULTRAM) 50 MG tablet Take 1 tablet (50 mg total) by mouth every 6 (six) hours as needed. 30 tablet 0 unknown at Unknown time   Scheduled:  . amLODipine 10 mg Oral Daily  . calcium acetate 1,334 mg Oral TID WC  . carbamazepine 500 mg Oral BID  . cloNIDine 0.2 mg Oral TID  . docusate sodium 100 mg Oral Daily  . epoetin (EPOGEN/PROCRIT) injection 4,000 Units Intravenous Q M,W,F-HD  . feeding supplement (NEPRO CARB STEADY) 237 mL Oral BID BM  . fluPHENAZine 2.5 mg Oral Daily  . hydrALAZINE 100 mg Oral Q8H  . Influenza vac split quadrivalent PF 0.5 mL Intramuscular Tomorrow-1000  . insulin aspart 0-15 Units Subcutaneous TID WC  . insulin aspart 0-5 Units Subcutaneous QHS  . minoxidil 5 mg Oral Daily  . pneumococcal 23 valent vaccine 0.5 mL Intramuscular Tomorrow-1000  . warfarin 10 mg Oral ONCE-1800  . Warfarin - Pharmacist Dosing Inpatient  Does not apply q1800   Infusions:  . heparin 1,500 Units/hr (08/11/16 8546)    Assessment: Pharmacy consulted to dose and monitor warfarin in this 56 year old male for treatment of a right upper extremity DVT. Patient also has ESRD on HD and has been anticoagulated with a heparin drip.   Patient not eligible for DOAC due to contraindicated drug interaction with carbamazepine. Patient is being started on warfarin today with heparin bridge.   Goal of Therapy: INR  2-3 Monitor platelets by anticoagulation protocol: Yes  Plan: 3/11 INR  0.97  warfarin 7.5mg  3/12 INR  0.97  warfarin 7.5mg  3/13  warfarin 10 mg 3/14 INR  1.59 warfarin 7.5 mg 3/15 INR  1.81  Warfarin 7.5mg  3/16 INR  1.75  Warfarin 10 mg 3/17 INR  1.82  Warfarin 8 mg 3/18 INR  2.34  .  3/17 Patient doesn't want HL drawn now because he doesn't like the 3rd shift phlebs --talked to his RN and she said he  is okay with getting labs drawn with 1st shift so rescheduled HL for 3/17 @ 0800.  3/17: Will give Warfarin 7.5 mg  x 1 today.  Patient on carbamazepine.  Patient being bridged with Heparin drip. (Consider discontinue Heparin drip tomorrow 3/19, if INR therapeutic again)  Chinita Greenland PharmD Clinical Pharmacist 08/16/2016

## 2016-08-16 NOTE — Progress Notes (Signed)
Subjective:  Blood pressure has been high through the day. Much of this at times is volume mediated.   Due for HD again tomorrow.   Objective:  Vital signs in last 24 hours:  Temp:  [98.1 F (36.7 C)-98.2 F (36.8 C)] 98.1 F (36.7 C) (03/18 2038) Pulse Rate:  [74-82] 74 (03/18 2038) Resp:  [14-18] 14 (03/18 1132) BP: (167-198)/(74-104) 169/84 (03/18 2038) SpO2:  [90 %-100 %] 92 % (03/18 2038) Weight:  [82.4 kg (181 lb 9.6 oz)] 82.4 kg (181 lb 9.6 oz) (03/18 0520)  Weight change: 1.673 kg (3 lb 11 oz) Filed Weights   08/14/16 1340 08/15/16 0522 08/16/16 0520  Weight: 77.5 kg (170 lb 13.7 oz) 78.8 kg (173 lb 11.2 oz) 82.4 kg (181 lb 9.6 oz)    Intake/Output:    Intake/Output Summary (Last 24 hours) at 08/16/16 2042 Last data filed at 08/16/16 1900  Gross per 24 hour  Intake              360 ml  Output              600 ml  Net             -240 ml     Physical Exam: General: Chronically ill appearing  HEENT Anicteric  Neck supple  Pulm/lungs CTAB, normal effort   CVS/Heart Regular rhythm no rubs  Abdomen:  Soft, nontender, nondistended  Extremities: Trace edema  Neurologic: Awake, alert, following commands   Skin: Dry skin  Access: Left upper arm AV fistula       Basic Metabolic Panel:   Recent Labs Lab 08/10/16 0346 08/12/16 0404 08/12/16 1000 08/13/16 0340 08/14/16 0937  NA 138 134*  --  133*  --   K 4.1 4.7  --  4.4  --   CL 99* 96*  --  95*  --   CO2 30 29  --  28  --   GLUCOSE 89 81  --  94  --   BUN 49* 44*  --  37*  --   CREATININE 7.76* 6.56*  --  4.72*  --   CALCIUM 8.6* 9.0  --  9.3  --   PHOS 4.2  --  3.4 3.0 4.1     CBC:  Recent Labs Lab 08/10/16 0346 08/12/16 0404 08/14/16 0409  WBC 6.8 6.1 6.5  HGB 10.9* 10.8* 11.5*  HCT 31.2* 31.0* 33.8*  MCV 106.1* 106.0* 106.4*  PLT 172 198 242      Microbiology:  Recent Results (from the past 720 hour(s))  MRSA PCR Screening     Status: None   Collection Time: 08/03/16  6:09  PM  Result Value Ref Range Status   MRSA by PCR NEGATIVE NEGATIVE Final    Comment:        The GeneXpert MRSA Assay (FDA approved for NASAL specimens only), is one component of a comprehensive MRSA colonization surveillance program. It is not intended to diagnose MRSA infection nor to guide or monitor treatment for MRSA infections.     Coagulation Studies:  Recent Labs  08/14/16 0409 08/15/16 0831 08/16/16 1102  LABPROT 20.7* 21.3* 26.1*  INR 1.75 1.82 2.34    Urinalysis: No results for input(s): COLORURINE, LABSPEC, PHURINE, GLUCOSEU, HGBUR, BILIRUBINUR, KETONESUR, PROTEINUR, UROBILINOGEN, NITRITE, LEUKOCYTESUR in the last 72 hours.  Invalid input(s): APPERANCEUR    Imaging: No results found.   Medications:   . heparin 1,700 Units/hr (08/16/16 1634)   . amLODipine  10 mg  Oral Daily  . calcium acetate  1,334 mg Oral TID WC  . carbamazepine  500 mg Oral BID  . cloNIDine  0.2 mg Oral TID  . cyclobenzaprine  5 mg Oral BID  . docusate sodium  100 mg Oral Daily  . epoetin (EPOGEN/PROCRIT) injection  4,000 Units Intravenous Q M,W,F-HD  . feeding supplement (NEPRO CARB STEADY)  237 mL Oral BID BM  . fluPHENAZine  2.5 mg Oral Daily  . hydrALAZINE  100 mg Oral Q6H  . Influenza vac split quadrivalent PF  0.5 mL Intramuscular Tomorrow-1000  . insulin aspart  0-15 Units Subcutaneous TID WC  . insulin aspart  0-5 Units Subcutaneous QHS  . lidocaine  1 patch Transdermal Q24H  . losartan  50 mg Oral QHS  . minoxidil  10 mg Oral Daily  . pneumococcal 23 valent vaccine  0.5 mL Intramuscular Tomorrow-1000  . trihexyphenidyl  5 mg Oral TID WC  . Warfarin - Pharmacist Dosing Inpatient   Does not apply q1800   acetaminophen, hydrALAZINE, ipratropium-albuterol, morphine injection, nitroGLYCERIN, oxyCODONE-acetaminophen  Assessment/ Plan:  56 y.o.african American male  With schizophrenia, hypertension, hepatitis C, tobacco abuse, anemia of chronic kidney disease, secondary  hyperparathyroidism, end-stage renal disease  [facility] MWF [place].   1.  End-stage renal disease on HD MWF 2.  Severe hyperkalemia, life threatening, had cardiac arrest with ACLS protocol. 3.  Acute respiratory failure, extubated 3/6 4.  Anemia of chronic kidney disease. Hgb 11.5 5.  RUE DVT, started on anticoagulation. 6.  Secondary hyperparathyroidism. Phos 4.1.  -   Pt will be due for HD again tomorrow, orders to be prepared.  Overall pt has done well this admission considering the life threatening hyperkalemia he had previously.  K has been controlled recently.  Continue to monitor CBC periodically as well as phos with dialysis.  Possible discharge tomorrow now that INR at target.       LOS: [place] 3/18/20188:42 PM

## 2016-08-16 NOTE — Progress Notes (Signed)
Patient has rested quietly today - up to chair most of the afternoon. MD aware that BP remains elevated. Patient is to have dialysis tomorrow and possible discharge pending coagulation status.

## 2016-08-16 NOTE — Progress Notes (Signed)
ANTICOAGULATION CONSULT NOTE -   Pharmacy Consult for Heparin  Indication: VTE prophylaxis       Allergies  Allergen Reactions  . Thorazine [Chlorpromazine] Other (See Comments)    Reaction: Unknown , pt states it makes him feel real bad    Patient Measurements: Height: 6\' 1"  (185.4 cm) Weight: 176 lb 8 oz (80.1 kg) IBW/kg (Calculated) : 79.9 Heparin Dosing Weight: 85.7 kg   Vital Signs: Temp: 98.6 F (37 C) (03/13 1104) Temp Source: Oral (03/13 1104) BP: 172/75 (03/13 1107) Pulse Rate: 60 (03/13 1107)  Labs:  Medical History:     Past Medical History:  Diagnosis Date  . Anemia   . Diabetes mellitus without complication ([place])   . Dialysis patient [facility])    Mon. -Wed.- Fri  . ESRD (end stage renal disease) ([place])   . Hyperlipidemia   . Malignant hypertension   . Renal artery stenosis ([place])   . Schizophrenia ([place])     Medications:         Prescriptions Prior to Admission  Medication Sig Dispense Refill Last Dose  . amLODipine (NORVASC) 10 MG tablet Take 10 mg by mouth daily.   unknown at unknown  . atenolol (TENORMIN) 25 MG tablet Take 25 mg by mouth 2 (two) times daily.    unknown at unknown  . b complex-vitamin c-folic acid (NEPHRO-VITE) 0.8 MG TABS tablet Take 1 tablet by mouth daily.   unknown at unknown  . calcium acetate (PHOSLO) 667 MG capsule Take 2 capsules (1,334 mg total) by mouth 3 (three) times daily with meals. 60 capsule 1 unknown at unknown  . carbamazepine (TEGRETOL XR) 200 MG 12 hr tablet Take 1,000 mg by mouth every morning.   unknown at unknown  . cholecalciferol (VITAMIN D) 1000 UNITS tablet Take 1,000 Units by mouth daily.   unknown at unknown  . citalopram (CELEXA) 20 MG tablet Take 1 tablet (20 mg total) by mouth daily. 30 tablet 1 unknown at unknown  . cloNIDine (CATAPRES) 0.2 MG tablet Take 1 tablet (0.2 mg total) by mouth 3 (three) times daily. 90 tablet 1 unknown at unknown  . ferrous sulfate 325  (65 FE) MG tablet Take 1 tablet (325 mg total) by mouth daily. 30 tablet 3 unknown at unknown  . fluPHENAZine (PROLIXIN) 2.5 MG tablet Take 2.5 mg by mouth daily.    unknown at unknown  . Fluticasone-Salmeterol (ADVAIR DISKUS) 250-50 MCG/DOSE AEPB Inhale 1 puff into the lungs 2 (two) times daily.   unknown at unknown  . hydrALAZINE (APRESOLINE) 100 MG tablet Take 1 tablet (100 mg total) by mouth every 6 (six) hours. (Patient taking differently: Take 100 mg by mouth 2 (two) times daily. ) 120 tablet 1 unknown at unknown  . minoxidil (LONITEN) 2.5 MG tablet Take 2 tablets (5 mg total) by mouth daily. 60 tablet 1 unknown at unknown  . trihexyphenidyl (ARTANE) 5 MG tablet Take 5 mg by mouth 3 (three) times daily with meals.   unknown at unknown  . acetaminophen (TYLENOL) 500 MG tablet Take 500 mg by mouth every 4 (four) hours as needed for mild pain. Take 1 tablet every 4-6 hours as needed for pain.   prn at prn  . albuterol (PROAIR HFA) 108 (90 Base) MCG/ACT inhaler Inhale 1-2 puffs into the lungs every 6 (six) hours as needed for wheezing or shortness of breath (cough).   prn at prn  . amoxicillin-clavulanate (AUGMENTIN) 500-125 MG tablet Take 1 tablet (500 mg total) by mouth  3 (three) times daily. 15 tablet 0   . baclofen (LIORESAL) 10 MG tablet Take 1 tablet (10 mg total) by mouth 2 (two) times daily as needed for muscle spasms. 30 each 0 05/25/2016 at Unknown time  . benzonatate (TESSALON) 200 MG capsule Take 1 capsule (200 mg total) by mouth 2 (two) times daily as needed for cough. 20 capsule 0   . calcium acetate (PHOSLO) 667 MG capsule Take 1,334 mg by mouth 3 (three) times daily with meals.    unknown at unknown  . carbamazepine (TEGRETOL) 200 MG tablet Take 2.5 tablets (500 mg total) by mouth 2 (two) times daily. Taken 2 1/2 tabs (500mg  ) twice a day (Patient not taking: Reported on 04/14/2016) 150 tablet 0 Not Taking  . cyclobenzaprine (FLEXERIL) 5 MG tablet Take 5 mg by mouth at  bedtime as needed for muscle spasms.   prn at prn  . fluticasone furoate-vilanterol (BREO ELLIPTA) 100-25 MCG/INH AEPB Inhale 1 puff into the lungs daily.   unknown at unknown  . traMADol (ULTRAM) 50 MG tablet Take 1 tablet (50 mg total) by mouth every 6 (six) hours as needed. 30 tablet 0 unknown at Unknown time    Assessment: Pharmacy consulted to dose heparin in this 56 year old male with VTE.   Goal of Therapy: Heparin level 0.3-0.7 units/ml Monitor platelets by anticoagulation protocol: Yes  Plan: 3/18: HL=0.66.  Will continue heparin infusion at current rate and recheck  HL with am labs.  (Patient also started on Warfarin-consider d/c Heparin after Warfarin therapeutic x 2 days).     Thank you for this consult.  Noralee Space, PharmD, BCPS Clinical Pharmacist 08/16/2016 12:00 PM

## 2016-08-16 NOTE — Progress Notes (Signed)
Pt wanting dose of Artane. Attempting to explain that medication was restarting during the middle of the day, and that has had the required doses for the day, and that the next dose would not be until the morning with breakfast. Pt. requesting another dose, explaining to pt several times, not due until in the am.

## 2016-08-16 NOTE — Progress Notes (Addendum)
[place] [facility] [place] NAME: Todd Brown    MR#:  [id number]  DATE OF BIRTH:  08/14/1960  SUBJECTIVE:  CHIEF COMPLAINT:   Chief Complaint  Patient presents with  . Weakness   - Status post cardiac arrest after hyperkalemia. Dialysis patient and doing well. -Complains of muscular chest pain from his compressions. INR is pending today  REVIEW OF SYSTEMS:  Review of Systems  Constitutional: Negative for chills, fever and malaise/fatigue.  HENT: Negative for congestion, ear discharge, hearing loss and nosebleeds.   Eyes: Negative for blurred vision and double vision.  Respiratory: Negative for cough, shortness of breath and wheezing.   Cardiovascular: Positive for chest pain. Negative for palpitations and leg swelling.  Gastrointestinal: Negative for abdominal pain, constipation, diarrhea, nausea and vomiting.  Genitourinary: Negative for dysuria.  Musculoskeletal: Positive for myalgias. Negative for back pain.  Neurological: Negative for dizziness, sensory change, speech change, focal weakness, seizures and headaches.  Psychiatric/Behavioral: Negative for depression.    DRUG ALLERGIES:   Allergies  Allergen Reactions  . Thorazine [Chlorpromazine] Other (See Comments)    Reaction:  Unknown , pt states it makes him feel real bad    VITALS:  Blood pressure (!) 178/95, pulse 76, temperature 98.2 F (36.8 C), temperature source Axillary, resp. rate 18, height 6\' 1"  (1.854 m), weight 82.4 kg (181 lb 9.6 oz), SpO2 90 %.  PHYSICAL EXAMINATION:  Physical Exam  GENERAL:  56 y.o.-year-old patient lying in the bed with no acute distress. Chronically ill-appearing EYES: Pupils equal, round, reactive to light and accommodation. No scleral icterus. Extraocular muscles intact.  HEENT: Head atraumatic, normocephalic. Oropharynx and nasopharynx clear. No teeth present NECK:  Supple, no jugular venous distention. No thyroid enlargement, no  tenderness.  LUNGS: Normal breath sounds bilaterally, no wheezing, rales,rhonchi or crepitation. No use of accessory muscles of respiration. Decreased bibasilar breath sounds  CARDIOVASCULAR: S1, S2 normal. No murmurs, rubs, or gallops.  ABDOMEN: Soft, nontender, nondistended. Bowel sounds present. No organomegaly or mass.  EXTREMITIES: No pedal edema, cyanosis, or clubbing. Left arm dialysis catheter NEUROLOGIC: Cranial nerves II through XII are intact. Muscle strength 5/5 in all extremities. Sensation intact. Gait not checked.  PSYCHIATRIC: The patient is alert and oriented x 3.  SKIN: No obvious rash, lesion, or ulcer.    LABORATORY PANEL:   CBC  Recent Labs Lab 08/14/16 0409  WBC 6.5  HGB 11.5*  HCT 33.8*  PLT 242   ------------------------------------------------------------------------------------------------------------------  Chemistries   Recent Labs Lab 08/13/16 0340  NA 133*  K 4.4  CL 95*  CO2 28  GLUCOSE 94  BUN 37*  CREATININE 4.72*  CALCIUM 9.3   ------------------------------------------------------------------------------------------------------------------  Cardiac Enzymes No results for input(s): TROPONINI in the last 168 hours. ------------------------------------------------------------------------------------------------------------------  RADIOLOGY:  No results found.  EKG:   Orders placed or performed during the hospital encounter of 08/03/16  . EKG 12-Lead  . EKG 12-Lead  . EKG 12-Lead  . EKG 12-Lead    ASSESSMENT AND PLAN:   56 year old African-American male with past medical history significant for schizophrenia, end-stage renal disease on Monday Wednesday Friday hemodialysis, diabetes mellitus, hypertension, hepatitis C, anemia of chronic disease presents to hospital with cardiac arrest.  #1 acute right occlusive cephalic and basilic vein thrombosis-continue heparin drip. Started on Coumadin. -INR is still subtherapeutic at 1.8  yesterday, INR from today is pending -Pharmacy adjusting Coumadin dose.  #2 End stage renal disease on Monday Wednesday Friday hemodialysis-paralysis per schedule. Next  dialysis on Monday. -Appreciate nephrology consult  #3 cardiac arrests-secondary to hyperkalemia. Patient had respiratory failure secondary to cardiac arrest with prolonged resuscitation. -Appreciate intensivist consult. Patient doing well at this time. Potassium regulated via dialysis. -Dietary potassium restriction educated. -Breathing on room air. Continue incentive spirometry.  - meds for musculoskeletal chest pain added.  #4 history of schizophrenia-continue home medications.  #5 hypertension-continue clonidine, oral hydralazine, minoxidil and Norvasc. Morning BP elevated- added losartan at bedtime  #6 anemia of chronic disease-on  Epogen injections with dialysis  #7 DVT prophylaxis-patient on heparin drip and Coumadin INR is pending now   Physical therapy consulted and they have recommended home health at discharge    All the records are reviewed and case discussed with Care Management/Social Workerr. Management plans discussed with the patient, family and they are in agreement.  CODE STATUS: Full code  TOTAL TIME TAKING CARE OF THIS PATIENT: 38 minutes.   POSSIBLE D/C IN 1-2 DAYS, DEPENDING ON CLINICAL CONDITION.   Gladstone Lighter M.D on 08/16/2016 at 11:18 AM  Between 7am to 6pm - Pager - [phone]  After 6pm go to [email or web address] - password [email or web address]  Office  [phone]  CC: Primary care physician; Nino Glow McLean-Scocuzza, MD

## 2016-08-16 NOTE — Progress Notes (Signed)
Per Dr. Tressia Miners, okay for lab to draw INR/PT out of patient's left hand. Other labs will be drawn in dialysis tomorrow.

## 2016-08-17 LAB — BASIC METABOLIC PANEL
ANION GAP: 12 (ref 5–15)
BUN: 52 mg/dL — ABNORMAL HIGH (ref 6–20)
CALCIUM: 9.2 mg/dL (ref 8.9–10.3)
CO2: 25 mmol/L (ref 22–32)
CREATININE: 7.18 mg/dL — AB (ref 0.61–1.24)
Chloride: 89 mmol/L — ABNORMAL LOW (ref 101–111)
GFR, EST AFRICAN AMERICAN: 9 mL/min — AB (ref >60)
GFR, EST NON AFRICAN AMERICAN: 8 mL/min — AB (ref >60)
Glucose, Bld: 134 mg/dL — ABNORMAL HIGH (ref 65–99)
Potassium: 4.9 mmol/L (ref 3.5–5.1)
SODIUM: 126 mmol/L — AB (ref 135–145)

## 2016-08-17 LAB — CBC
HCT: 31.5 % — ABNORMAL LOW (ref 40.0–52.0)
Hemoglobin: 11 g/dL — ABNORMAL LOW (ref 13.0–18.0)
MCH: 36.3 pg — AB (ref 26.0–34.0)
MCHC: 34.8 g/dL (ref 32.0–36.0)
MCV: 104.3 fL — ABNORMAL HIGH (ref 80.0–100.0)
PLATELETS: 302 10*3/uL (ref 150–440)
RBC: 3.02 MIL/uL — AB (ref 4.40–5.90)
RDW: 15 % — ABNORMAL HIGH (ref 11.5–14.5)
WBC: 6.9 10*3/uL (ref 3.8–10.6)

## 2016-08-17 LAB — PROTIME-INR
INR: 2.41
Prothrombin Time: 26.7 s — ABNORMAL HIGH (ref 11.4–15.2)

## 2016-08-17 LAB — RENAL FUNCTION PANEL
Albumin: 3.3 g/dL — ABNORMAL LOW (ref 3.5–5.0)
Anion gap: 11 (ref 5–15)
BUN: 51 mg/dL — ABNORMAL HIGH (ref 6–20)
CO2: 25 mmol/L (ref 22–32)
Calcium: 9.2 mg/dL (ref 8.9–10.3)
Chloride: 89 mmol/L — ABNORMAL LOW (ref 101–111)
Creatinine, Ser: 7.1 mg/dL — ABNORMAL HIGH (ref 0.61–1.24)
GFR calc Af Amer: 9 mL/min — ABNORMAL LOW
GFR calc non Af Amer: 8 mL/min — ABNORMAL LOW
Glucose, Bld: 131 mg/dL — ABNORMAL HIGH (ref 65–99)
Phosphorus: 4.1 mg/dL (ref 2.5–4.6)
Potassium: 4.9 mmol/L (ref 3.5–5.1)
Sodium: 125 mmol/L — ABNORMAL LOW (ref 135–145)

## 2016-08-17 LAB — GLUCOSE, CAPILLARY
GLUCOSE-CAPILLARY: 85 mg/dL (ref 65–99)
Glucose-Capillary: 100 mg/dL — ABNORMAL HIGH (ref 65–99)
Glucose-Capillary: 113 mg/dL — ABNORMAL HIGH (ref 65–99)
Glucose-Capillary: 119 mg/dL — ABNORMAL HIGH (ref 65–99)
Glucose-Capillary: 92 mg/dL (ref 65–99)

## 2016-08-17 LAB — HEPARIN LEVEL (UNFRACTIONATED): Heparin Unfractionated: 0.85 [IU]/mL — ABNORMAL HIGH (ref 0.30–0.70)

## 2016-08-17 LAB — PHOSPHORUS: Phosphorus: 4 mg/dL (ref 2.5–4.6)

## 2016-08-17 MED ORDER — EPOETIN ALFA 4000 UNIT/ML IJ SOLN
4000.0000 [IU] | INTRAMUSCULAR | Status: DC
  Administered 2016-08-17: 4000 [IU] via INTRAVENOUS
  Filled 2016-08-17: qty 1

## 2016-08-17 MED ORDER — CLONIDINE HCL 0.1 MG PO TABS
0.3000 mg | ORAL_TABLET | Freq: Three times a day (TID) | ORAL | Status: DC
  Administered 2016-08-17 – 2016-08-18 (×3): 0.3 mg via ORAL
  Filled 2016-08-17 (×3): qty 3

## 2016-08-17 MED ORDER — OXYCODONE-ACETAMINOPHEN 7.5-325 MG PO TABS: 1.0000 | ORAL_TABLET | ORAL | 0 refills | Status: DC | PRN

## 2016-08-17 MED ORDER — MINOXIDIL 10 MG PO TABS: 10.0000 mg | ORAL_TABLET | Freq: Every day | ORAL | 2 refills | Status: DC

## 2016-08-17 MED ORDER — WARFARIN SODIUM 6 MG PO TABS: 6.0000 mg | ORAL_TABLET | Freq: Once | ORAL | 2 refills | Status: DC

## 2016-08-17 MED ORDER — CARBAMAZEPINE 200 MG PO TABS: 500.0000 mg | ORAL_TABLET | Freq: Two times a day (BID) | ORAL | 0 refills | Status: DC

## 2016-08-17 MED ORDER — WARFARIN SODIUM 5 MG PO TABS: 7.5000 mg | ORAL_TABLET | Freq: Once | ORAL | Status: DC

## 2016-08-17 MED ORDER — CLONIDINE HCL 0.3 MG PO TABS: 0.3000 mg | ORAL_TABLET | Freq: Three times a day (TID) | ORAL | 11 refills | Status: DC

## 2016-08-17 MED ORDER — DOCUSATE SODIUM 100 MG PO CAPS: 100.0000 mg | ORAL_CAPSULE | Freq: Every day | ORAL | 0 refills | Status: DC

## 2016-08-17 MED ORDER — WARFARIN SODIUM 7.5 MG PO TABS: 7.5000 mg | ORAL_TABLET | Freq: Once | ORAL | 0 refills | Status: DC

## 2016-08-17 MED ORDER — HYDRALAZINE HCL 100 MG PO TABS: 100.0000 mg | ORAL_TABLET | Freq: Four times a day (QID) | ORAL | 2 refills | Status: DC

## 2016-08-17 MED ORDER — LIDOCAINE 5 % EX PTCH: 1.0000 | MEDICATED_PATCH | CUTANEOUS | 0 refills | Status: DC

## 2016-08-17 MED ORDER — WARFARIN SODIUM 5 MG PO TABS
8.0000 mg | ORAL_TABLET | Freq: Once | ORAL | Status: AC
  Administered 2016-08-17: 18:00:00 8 mg via ORAL
  Filled 2016-08-17: qty 1

## 2016-08-17 NOTE — Progress Notes (Signed)
Heparin drip discontinued,had hemodialysis today without events.

## 2016-08-17 NOTE — Progress Notes (Signed)
  End of hd 

## 2016-08-17 NOTE — Progress Notes (Signed)
Pre hd assessment  

## 2016-08-17 NOTE — Progress Notes (Signed)
Pre hd info 

## 2016-08-17 NOTE — Care Management (Signed)
Barrier- blood pressure that is elevated which can be related to fluid volume.  INR 3/18 2.34

## 2016-08-17 NOTE — Progress Notes (Signed)
ANTICOAGULATION CONSULT NOTE -   Pharmacy Consult for Heparin  Indication: VTE prophylaxis       Allergies  Allergen Reactions  . Thorazine [Chlorpromazine] Other (See Comments)    Reaction: Unknown , pt states it makes him feel real bad    Patient Measurements: Height: 6\' 1"  (185.4 cm) Weight: 176 lb 8 oz (80.1 kg) IBW/kg (Calculated) : 79.9 Heparin Dosing Weight: 85.7 kg   Vital Signs: Temp: 98.6 F (37 C) (03/13 1104) Temp Source: Oral (03/13 1104) BP: 172/75 (03/13 1107) Pulse Rate: 60 (03/13 1107)  Labs:  Medical History:     Past Medical History:  Diagnosis Date  . Anemia   . Diabetes mellitus without complication ([place])   . Dialysis patient [facility])    Mon. -Wed.- Fri  . ESRD (end stage renal disease) ([place])   . Hyperlipidemia   . Malignant hypertension   . Renal artery stenosis ([place])   . Schizophrenia ([place])     Medications:         Prescriptions Prior to Admission  Medication Sig Dispense Refill Last Dose  . amLODipine (NORVASC) 10 MG tablet Take 10 mg by mouth daily.   unknown at unknown  . atenolol (TENORMIN) 25 MG tablet Take 25 mg by mouth 2 (two) times daily.    unknown at unknown  . b complex-vitamin c-folic acid (NEPHRO-VITE) 0.8 MG TABS tablet Take 1 tablet by mouth daily.   unknown at unknown  . calcium acetate (PHOSLO) 667 MG capsule Take 2 capsules (1,334 mg total) by mouth 3 (three) times daily with meals. 60 capsule 1 unknown at unknown  . carbamazepine (TEGRETOL XR) 200 MG 12 hr tablet Take 1,000 mg by mouth every morning.   unknown at unknown  . cholecalciferol (VITAMIN D) 1000 UNITS tablet Take 1,000 Units by mouth daily.   unknown at unknown  . citalopram (CELEXA) 20 MG tablet Take 1 tablet (20 mg total) by mouth daily. 30 tablet 1 unknown at unknown  . cloNIDine (CATAPRES) 0.2 MG tablet Take 1 tablet (0.2 mg total) by mouth 3 (three) times daily. 90 tablet 1 unknown at unknown  . ferrous sulfate 325  (65 FE) MG tablet Take 1 tablet (325 mg total) by mouth daily. 30 tablet 3 unknown at unknown  . fluPHENAZine (PROLIXIN) 2.5 MG tablet Take 2.5 mg by mouth daily.    unknown at unknown  . Fluticasone-Salmeterol (ADVAIR DISKUS) 250-50 MCG/DOSE AEPB Inhale 1 puff into the lungs 2 (two) times daily.   unknown at unknown  . hydrALAZINE (APRESOLINE) 100 MG tablet Take 1 tablet (100 mg total) by mouth every 6 (six) hours. (Patient taking differently: Take 100 mg by mouth 2 (two) times daily. ) 120 tablet 1 unknown at unknown  . minoxidil (LONITEN) 2.5 MG tablet Take 2 tablets (5 mg total) by mouth daily. 60 tablet 1 unknown at unknown  . trihexyphenidyl (ARTANE) 5 MG tablet Take 5 mg by mouth 3 (three) times daily with meals.   unknown at unknown  . acetaminophen (TYLENOL) 500 MG tablet Take 500 mg by mouth every 4 (four) hours as needed for mild pain. Take 1 tablet every 4-6 hours as needed for pain.   prn at prn  . albuterol (PROAIR HFA) 108 (90 Base) MCG/ACT inhaler Inhale 1-2 puffs into the lungs every 6 (six) hours as needed for wheezing or shortness of breath (cough).   prn at prn  . amoxicillin-clavulanate (AUGMENTIN) 500-125 MG tablet Take 1 tablet (500 mg total) by mouth  3 (three) times daily. 15 tablet 0   . baclofen (LIORESAL) 10 MG tablet Take 1 tablet (10 mg total) by mouth 2 (two) times daily as needed for muscle spasms. 30 each 0 05/25/2016 at Unknown time  . benzonatate (TESSALON) 200 MG capsule Take 1 capsule (200 mg total) by mouth 2 (two) times daily as needed for cough. 20 capsule 0   . calcium acetate (PHOSLO) 667 MG capsule Take 1,334 mg by mouth 3 (three) times daily with meals.    unknown at unknown  . carbamazepine (TEGRETOL) 200 MG tablet Take 2.5 tablets (500 mg total) by mouth 2 (two) times daily. Taken 2 1/2 tabs (500mg  ) twice a day (Patient not taking: Reported on 04/14/2016) 150 tablet 0 Not Taking  . cyclobenzaprine (FLEXERIL) 5 MG tablet Take 5 mg by mouth at  bedtime as needed for muscle spasms.   prn at prn  . fluticasone furoate-vilanterol (BREO ELLIPTA) 100-25 MCG/INH AEPB Inhale 1 puff into the lungs daily.   unknown at unknown  . traMADol (ULTRAM) 50 MG tablet Take 1 tablet (50 mg total) by mouth every 6 (six) hours as needed. 30 tablet 0 unknown at Unknown time    Assessment: Pharmacy consulted to dose heparin in this 56 year old male with VTE.   Goal of Therapy: Heparin level 0.3-0.7 units/ml Monitor platelets by anticoagulation protocol: Yes  Plan: 3/18: HL=0.66.  Will continue heparin infusion at current rate and recheck  HL with am labs.  (Patient also started on Warfarin-consider d/c Heparin after Warfarin therapeutic x 2 days).   3/19 HL supratherapeutic at 0.88. Will decrease heparin drip to 1500units/hr. Recommend discontinuation of Heparin drip due to therapeutic INR.     Thank you for this consult.  Pernell Dupre, PharmD, BCPS Clinical Pharmacist 08/17/2016 10:46 AM

## 2016-08-17 NOTE — Progress Notes (Signed)
Nutrition Brief Note  Patient lethargic, resting comfortably. Left low potassium and high potassium pictorial handouts with patient to help with potassium control as outpt. Discharging today.  Todd Brown. Jaquelynn Wanamaker, MS, RD LDN Inpatient Clinical Dietitian Pager [phone]

## 2016-08-17 NOTE — Progress Notes (Signed)
Post hd assessment 

## 2016-08-17 NOTE — Progress Notes (Addendum)
ANTICOAGULATION CONSULT NOTE -  Pharmacy Consult for warfarin Indication: DVT       Allergies  Allergen Reactions  . Thorazine [Chlorpromazine] Other (See Comments)    Reaction: Unknown , pt states it makes him feel real bad    Patient Measurements: Height: 6\' 1"  (185.4 cm) Weight: 176 lb 8 oz (80.1 kg) IBW/kg (Calculated) : 79.9  Vital Signs: Temp: 98.6 F (37 C) (03/13 1104) Temp Source: Oral (03/13 1104) BP: 172/75 (03/13 1107) Pulse Rate: 60 (03/13 1107)  Labs:  Recent Labs (last 2 labs)    Recent Labs  08/08/16 1345 08/09/16 0512 08/09/16 1325 08/10/16 0346  HGB 10.9* --  --  10.9*  HCT 31.2* --  --  31.2*  PLT 162 --  --  172  LABPROT --  --  12.9 12.9  INR --  --  0.97 0.97  HEPARINUNFRC 0.48 0.46 --  0.35  CREATININE --  --  --  7.76*      Estimated Creatinine Clearance: 12.2 mL/min (by C-G formula based on SCr of 7.76 mg/dL (H)).   Medical History:     Past Medical History:  Diagnosis Date  . Anemia   . Diabetes mellitus without complication ([place])   . Dialysis patient [facility])    Mon. -Wed.- Fri  . ESRD (end stage renal disease) ([place])   . Hyperlipidemia   . Malignant hypertension   . Renal artery stenosis ([place])   . Schizophrenia ([place])    Medications:         Prescriptions Prior to Admission  Medication Sig Dispense Refill Last Dose  . amLODipine (NORVASC) 10 MG tablet Take 10 mg by mouth daily.   unknown at unknown  . atenolol (TENORMIN) 25 MG tablet Take 25 mg by mouth 2 (two) times daily.    unknown at unknown  . b complex-vitamin c-folic acid (NEPHRO-VITE) 0.8 MG TABS tablet Take 1 tablet by mouth daily.   unknown at unknown  . calcium acetate (PHOSLO) 667 MG capsule Take 2 capsules (1,334 mg total) by mouth 3 (three) times daily with meals. 60 capsule 1 unknown at unknown  . carbamazepine (TEGRETOL XR) 200 MG 12 hr tablet Take 1,000 mg by mouth every morning.   unknown at  unknown  . cholecalciferol (VITAMIN D) 1000 UNITS tablet Take 1,000 Units by mouth daily.   unknown at unknown  . citalopram (CELEXA) 20 MG tablet Take 1 tablet (20 mg total) by mouth daily. 30 tablet 1 unknown at unknown  . cloNIDine (CATAPRES) 0.2 MG tablet Take 1 tablet (0.2 mg total) by mouth 3 (three) times daily. 90 tablet 1 unknown at unknown  . ferrous sulfate 325 (65 FE) MG tablet Take 1 tablet (325 mg total) by mouth daily. 30 tablet 3 unknown at unknown  . fluPHENAZine (PROLIXIN) 2.5 MG tablet Take 2.5 mg by mouth daily.    unknown at unknown  . Fluticasone-Salmeterol (ADVAIR DISKUS) 250-50 MCG/DOSE AEPB Inhale 1 puff into the lungs 2 (two) times daily.   unknown at unknown  . hydrALAZINE (APRESOLINE) 100 MG tablet Take 1 tablet (100 mg total) by mouth every 6 (six) hours. (Patient taking differently: Take 100 mg by mouth 2 (two) times daily. ) 120 tablet 1 unknown at unknown  . minoxidil (LONITEN) 2.5 MG tablet Take 2 tablets (5 mg total) by mouth daily. 60 tablet 1 unknown at unknown  . trihexyphenidyl (ARTANE) 5 MG tablet Take 5 mg by mouth 3 (three) times daily with meals.   unknown  at unknown  . acetaminophen (TYLENOL) 500 MG tablet Take 500 mg by mouth every 4 (four) hours as needed for mild pain. Take 1 tablet every 4-6 hours as needed for pain.   prn at prn  . albuterol (PROAIR HFA) 108 (90 Base) MCG/ACT inhaler Inhale 1-2 puffs into the lungs every 6 (six) hours as needed for wheezing or shortness of breath (cough).   prn at prn  . amoxicillin-clavulanate (AUGMENTIN) 500-125 MG tablet Take 1 tablet (500 mg total) by mouth 3 (three) times daily. 15 tablet 0   . baclofen (LIORESAL) 10 MG tablet Take 1 tablet (10 mg total) by mouth 2 (two) times daily as needed for muscle spasms. 30 each 0 05/25/2016 at Unknown time  . benzonatate (TESSALON) 200 MG capsule Take 1 capsule (200 mg total) by mouth 2 (two) times daily as needed for cough. 20 capsule 0   . calcium acetate  (PHOSLO) 667 MG capsule Take 1,334 mg by mouth 3 (three) times daily with meals.    unknown at unknown  . carbamazepine (TEGRETOL) 200 MG tablet Take 2.5 tablets (500 mg total) by mouth 2 (two) times daily. Taken 2 1/2 tabs (500mg  ) twice a day (Patient not taking: Reported on 04/14/2016) 150 tablet 0 Not Taking  . cyclobenzaprine (FLEXERIL) 5 MG tablet Take 5 mg by mouth at bedtime as needed for muscle spasms.   prn at prn  . fluticasone furoate-vilanterol (BREO ELLIPTA) 100-25 MCG/INH AEPB Inhale 1 puff into the lungs daily.   unknown at unknown  . traMADol (ULTRAM) 50 MG tablet Take 1 tablet (50 mg total) by mouth every 6 (six) hours as needed. 30 tablet 0 unknown at Unknown time   Scheduled:  . amLODipine 10 mg Oral Daily  . calcium acetate 1,334 mg Oral TID WC  . carbamazepine 500 mg Oral BID  . cloNIDine 0.2 mg Oral TID  . docusate sodium 100 mg Oral Daily  . epoetin (EPOGEN/PROCRIT) injection 4,000 Units Intravenous Q M,W,F-HD  . feeding supplement (NEPRO CARB STEADY) 237 mL Oral BID BM  . fluPHENAZine 2.5 mg Oral Daily  . hydrALAZINE 100 mg Oral Q8H  . Influenza vac split quadrivalent PF 0.5 mL Intramuscular Tomorrow-1000  . insulin aspart 0-15 Units Subcutaneous TID WC  . insulin aspart 0-5 Units Subcutaneous QHS  . minoxidil 5 mg Oral Daily  . pneumococcal 23 valent vaccine 0.5 mL Intramuscular Tomorrow-1000  . warfarin 10 mg Oral ONCE-1800  . Warfarin - Pharmacist Dosing Inpatient  Does not apply q1800   Infusions:  . heparin 1,500 Units/hr (08/11/16 4010)    Assessment: Pharmacy consulted to dose and monitor warfarin in this 56 year old male for treatment of a right upper extremity DVT. Patient also has ESRD on HD and has been anticoagulated with a heparin drip.   Patient not eligible for DOAC due to contraindicated drug interaction with carbamazepine. Patient is being started on warfarin today with heparin bridge.   Goal of Therapy: INR  2-3 Monitor platelets by anticoagulation protocol: Yes  Plan: 3/11 INR  0.97  warfarin 7.5mg  3/12 INR  0.97  warfarin 7.5mg  3/13  warfarin 10 mg 3/14 INR  1.59 warfarin 7.5 mg 3/15 INR  1.81  Warfarin 7.5mg  3/16 INR  1.75  Warfarin 10 mg 3/17 INR  1.82  Warfarin 8 mg 3/18 INR  2.34   Warfarin 7.5mg  3/19 INR  2.41 Warfarin 8mg    .  3/17 Patient doesn't want HL drawn now because he doesn't like the  3rd shift phlebs --talked to his RN and she said he is okay with getting labs drawn with 1st shift so rescheduled HL for 3/17 @ 0800.  3/17: Will give Warfarin 8 mg  x 1 today.  After calculating warfarin dose total for 7 days- patient may be stable of regimen of warfarin 8mg  a day.  Patient being bridged with Heparin drip. (Consider discontinue Heparin drip due to 2 therapeutic INRs)  Pernell Dupre, PharmD, BCPS Clinical Pharmacist 08/17/2016 10:59 AM

## 2016-08-17 NOTE — Progress Notes (Signed)
Subjective:  Seen and examined on hemodialysis. Tolerating treatment well.  Blood pressure at goal during treatment.   Objective:  Vital signs in last 24 hours:  Temp:  [97.8 F (36.6 C)-98.2 F (36.8 C)] 98.2 F (36.8 C) (03/19 1410) Pulse Rate:  [66-96] 90 (03/19 1415) Resp:  [10-18] 10 (03/19 1415) BP: (105-210)/(42-109) 140/71 (03/19 1415) SpO2:  [92 %-97 %] 97 % (03/19 0806) Weight:  [79.5 kg (175 lb 4.3 oz)-82.4 kg (181 lb 9.6 oz)] 79.5 kg (175 lb 4.3 oz) (03/19 1410)  Weight change: 0 kg (0 lb) Filed Weights   08/17/16 0500 08/17/16 1005 08/17/16 1410  Weight: 82.4 kg (181 lb 9.6 oz) 82.3 kg (181 lb 7 oz) 79.5 kg (175 lb 4.3 oz)    Intake/Output:    Intake/Output Summary (Last 24 hours) at 08/17/16 1525 Last data filed at 08/17/16 1452  Gross per 24 hour  Intake              600 ml  Output             4500 ml  Net            -3900 ml     Physical Exam: General: Chronically ill appearing  HEENT Anicteric  Neck supple  Pulm/lungs CTAB, normal effort   CVS/Heart Regular rhythm no rubs  Abdomen:  Soft, nontender, nondistended  Extremities: No edema  Neurologic: Awake, alert, following commands   Skin: Dry skin  Access: Left upper arm AV fistula       Basic Metabolic Panel:   Recent Labs Lab 08/12/16 0404 08/12/16 1000 08/13/16 0340 08/14/16 0937 08/17/16 0950 08/17/16 1021  NA 134*  --  133*  --  126* 125*  K 4.7  --  4.4  --  4.9 4.9  CL 96*  --  95*  --  89* 89*  CO2 29  --  28  --  25 25  GLUCOSE 81  --  94  --  134* 131*  BUN 44*  --  37*  --  52* 51*  CREATININE 6.56*  --  4.72*  --  7.18* 7.10*  CALCIUM 9.0  --  9.3  --  9.2 9.2  PHOS  --  3.4 3.0 4.1 4.0 4.1     CBC:  Recent Labs Lab 08/12/16 0404 08/14/16 0409 08/17/16 0950  WBC 6.1 6.5 6.9  HGB 10.8* 11.5* 11.0*  HCT 31.0* 33.8* 31.5*  MCV 106.0* 106.4* 104.3*  PLT 198 242 302      Microbiology:  Recent Results (from the past 720 hour(s))  MRSA PCR Screening      Status: None   Collection Time: 08/03/16  6:09 PM  Result Value Ref Range Status   MRSA by PCR NEGATIVE NEGATIVE Final    Comment:        The GeneXpert MRSA Assay (FDA approved for NASAL specimens only), is one component of a comprehensive MRSA colonization surveillance program. It is not intended to diagnose MRSA infection nor to guide or monitor treatment for MRSA infections.     Coagulation Studies:  Recent Labs  08/15/16 0831 08/16/16 1102 08/17/16 0950  LABPROT 21.3* 26.1* 26.7*  INR 1.82 2.34 2.41    Urinalysis: No results for input(s): COLORURINE, LABSPEC, PHURINE, GLUCOSEU, HGBUR, BILIRUBINUR, KETONESUR, PROTEINUR, UROBILINOGEN, NITRITE, LEUKOCYTESUR in the last 72 hours.  Invalid input(s): APPERANCEUR    Imaging: No results found.   Medications:    . amLODipine  10 mg Oral Daily  .  calcium acetate  1,334 mg Oral TID WC  . carbamazepine  500 mg Oral BID  . cloNIDine  0.3 mg Oral TID  . cyclobenzaprine  5 mg Oral BID  . docusate sodium  100 mg Oral Daily  . epoetin (EPOGEN/PROCRIT) injection  4,000 Units Intravenous Q M,W,F-HD  . feeding supplement (NEPRO CARB STEADY)  237 mL Oral BID BM  . fluPHENAZine  2.5 mg Oral Daily  . hydrALAZINE  100 mg Oral Q6H  . Influenza vac split quadrivalent PF  0.5 mL Intramuscular Tomorrow-1000  . insulin aspart  0-15 Units Subcutaneous TID WC  . insulin aspart  0-5 Units Subcutaneous QHS  . lidocaine  1 patch Transdermal Q24H  . minoxidil  10 mg Oral Daily  . pneumococcal 23 valent vaccine  0.5 mL Intramuscular Tomorrow-1000  . trihexyphenidyl  5 mg Oral TID WC  . warfarin  8 mg Oral ONCE-1800  . Warfarin - Pharmacist Dosing Inpatient   Does not apply q1800   acetaminophen, hydrALAZINE, ipratropium-albuterol, morphine injection, nitroGLYCERIN, oxyCODONE-acetaminophen  Assessment/ Plan:  56 y.o.african American male  With schizophrenia, hypertension, hepatitis C, tobacco abuse, anemia of chronic kidney disease,  secondary hyperparathyroidism, end-stage renal disease  [facility] MWF [place].   1.  End-stage renal disease with hyperkalemia on HD MWF - seen and examined on hemodialysis.  - discontinue losartan due to hyperkalemia - Low potassium diet reviewed. Literature provided.   2. Hypertension: difficult to control.  - Current regimen of amlodipine, clonidine, hydralazine, minoxidil.   3. Secondary Hyperparathyroidism: - Continue calcium acetate with meals.   4. Anemia of chronic kidney disease: hemoglobin at goal - EPO with HD treatment.      LOS: [place], [place] 3/19/20183:25 PM

## 2016-08-17 NOTE — Discharge Summary (Signed)
[place] [facility] [place] NAME: Todd Brown    MR#:  [id number]  DATE OF BIRTH:  [birth year]  DATE OF ADMISSION:  08/03/2016   ADMITTING PHYSICIAN: Wilhelmina Mcardle, MD  DATE OF DISCHARGE: 08/17/2017  PRIMARY CARE PHYSICIAN: Nino Glow McLean-Scocuzza, MD   ADMISSION DIAGNOSIS:   End stage renal disease ([place]) [N18.6] Hyperkalemia [E87.5] Cardiac arrest due to other underlying condition ([place]) [I46.8]  DISCHARGE DIAGNOSIS:   Active Problems:   Cardiac arrest ([place])   SECONDARY DIAGNOSIS:   Past Medical History:  Diagnosis Date  . Anemia   . Diabetes mellitus without complication ([place])   . Dialysis patient [facility])    Mon. -Wed.- Fri  . ESRD (end stage renal disease) ([place])   . Hyperlipidemia   . Malignant hypertension   . Renal artery stenosis ([place])   . Schizophrenia [facility])     HOSPITAL COURSE:   56 year old African-American male with past medical history significant for schizophrenia, end-stage renal disease on Monday Wednesday Friday hemodialysis, diabetes mellitus, hypertension, hepatitis C, anemia of chronic disease presents to hospital with cardiac arrest.  #1 acute right occlusive cephalic and basilic vein thrombosis-was on heparin drip. And bridged with Coumadin. -INR is therapeutic at 2.3. Will be discharged on oral coumadin and INR check in 2 days  #2 End stage renal disease on Monday Wednesday Friday hemodialysis-as per schedule. dialysis today. -Appreciate nephrology consult  #3 cardiac arrests-secondary to hyperkalemia. Patient had respiratory failure secondary to cardiac arrest with prolonged resuscitation. -Appreciate intensivist consult. Patient doing well at this time. Potassium regulated via dialysis. -Dietary potassium restriction educated. -Breathing on room air. Continue incentive spirometry.  - meds for musculoskeletal chest pain added.  #4 history of schizophrenia-continue home medications.  #5  hypertension-continue clonidine, oral hydralazine, minoxidil and Norvasc. No ACEI/ARB due to hyperkalemia on admission causing cardiac arrest Increased clonidine dose  #6 anemia of chronic disease-on  Epogen injections with dialysis     DISCHARGE CONDITIONS:   Guarded  CONSULTS OBTAINED:   Treatment Team:  Murlean Iba, MD Wilhelmina Mcardle, MD Algernon Huxley, MD  DRUG ALLERGIES:   Allergies  Allergen Reactions  . Thorazine [Chlorpromazine] Other (See Comments)    Reaction:  Unknown , pt states it makes him feel real bad   DISCHARGE MEDICATIONS:   Allergies as of 08/17/2016      Reactions   Thorazine [chlorpromazine] Other (See Comments)   Reaction:  Unknown , pt states it makes him feel real bad      Medication List    STOP taking these medications   ADVAIR DISKUS 250-50 MCG/DOSE Aepb Generic drug:  Fluticasone-Salmeterol   amoxicillin-clavulanate 500-125 MG tablet Commonly known as:  AUGMENTIN   atenolol 25 MG tablet Commonly known as:  TENORMIN   citalopram 20 MG tablet Commonly known as:  CELEXA   traMADol 50 MG tablet Commonly known as:  ULTRAM     TAKE these medications   acetaminophen 500 MG tablet Commonly known as:  TYLENOL Take 500 mg by mouth every 4 (four) hours as needed for mild pain. Take 1 tablet every 4-6 hours as needed for pain.   amLODipine 10 MG tablet Commonly known as:  NORVASC Take 10 mg by mouth daily.   b complex-vitamin c-folic acid 0.8 MG Tabs tablet Take 1 tablet by mouth daily.   baclofen 10 MG tablet Commonly known as:  LIORESAL Take 1 tablet (10 mg total) by mouth 2 (two) times daily as  needed for muscle spasms.   benzonatate 200 MG capsule Commonly known as:  TESSALON Take 1 capsule (200 mg total) by mouth 2 (two) times daily as needed for cough.   BREO ELLIPTA 100-25 MCG/INH Aepb Generic drug:  fluticasone furoate-vilanterol Inhale 1 puff into the lungs daily.   calcium acetate 667 MG capsule Commonly known  as:  PHOSLO Take 2 capsules (1,334 mg total) by mouth 3 (three) times daily with meals. What changed:  Another medication with the same name was removed. Continue taking this medication, and follow the directions you see here.   carbamazepine 200 MG tablet Commonly known as:  TEGRETOL Take 2.5 tablets (500 mg total) by mouth 2 (two) times daily. What changed:  additional instructions  Another medication with the same name was removed. Continue taking this medication, and follow the directions you see here.   cholecalciferol 1000 units tablet Commonly known as:  VITAMIN D Take 1,000 Units by mouth daily.   cloNIDine 0.3 MG tablet Commonly known as:  CATAPRES Take 1 tablet (0.3 mg total) by mouth 3 (three) times daily. What changed:  medication strength  how much to take   cyclobenzaprine 5 MG tablet Commonly known as:  FLEXERIL Take 5 mg by mouth at bedtime as needed for muscle spasms.   docusate sodium 100 MG capsule Commonly known as:  COLACE Take 1 capsule (100 mg total) by mouth daily. Start taking on:  08/18/2016   ferrous sulfate 325 (65 FE) MG tablet Take 1 tablet (325 mg total) by mouth daily.   fluPHENAZine 2.5 MG tablet Commonly known as:  PROLIXIN Take 2.5 mg by mouth daily.   hydrALAZINE 100 MG tablet Commonly known as:  APRESOLINE Take 1 tablet (100 mg total) by mouth every 6 (six) hours. What changed:  when to take this   lidocaine 5 % Commonly known as:  LIDODERM Place 1 patch onto the skin daily. Remove & Discard patch within 12 hours or as directed by MD   minoxidil 10 MG tablet Commonly known as:  LONITEN Take 1 tablet (10 mg total) by mouth daily. Start taking on:  08/18/2016 What changed:  medication strength  how much to take   oxyCODONE-acetaminophen 7.5-325 MG tablet Commonly known as:  PERCOCET Take 1 tablet by mouth every 4 (four) hours as needed for moderate pain or severe pain.   PROAIR HFA 108 (90 Base) MCG/ACT inhaler Generic  drug:  albuterol Inhale 1-2 puffs into the lungs every 6 (six) hours as needed for wheezing or shortness of breath (cough).   trihexyphenidyl 5 MG tablet Commonly known as:  ARTANE Take 5 mg by mouth 3 (three) times daily with meals.   warfarin 7.5 MG tablet Commonly known as:  COUMADIN Take 1 tablet (7.5 mg total) by mouth one time only at 6 PM.            Durable Medical Equipment        Start     Ordered   08/14/16 1541  For home use only DME Walker rolling  Once    Question:  Patient needs a walker to treat with the following condition  Answer:  Unsteady gait   08/14/16 1540       DISCHARGE INSTRUCTIONS:   1. Nephrology f/u for dialysis in 2 days 2. INR check and potassium check in 2 days 3. PCP f/u in 1 week  DIET:   Renal diet  ACTIVITY:   Activity as tolerated  OXYGEN:   Home  Oxygen: No.  Oxygen Delivery: room air  DISCHARGE LOCATION:   home   If you experience worsening of your admission symptoms, develop shortness of breath, life threatening emergency, suicidal or homicidal thoughts you must seek medical attention immediately by calling 911 or calling your MD immediately  if symptoms less severe.  You Must read complete instructions/literature along with all the possible adverse reactions/side effects for all the Medicines you take and that have been prescribed to you. Take any new Medicines after you have completely understood and accpet all the possible adverse reactions/side effects.   Please note  You were cared for by a hospitalist during your hospital stay. If you have any questions about your discharge medications or the care you received while you were in the hospital after you are discharged, you can call the unit and asked to speak with the hospitalist on call if the hospitalist that took care of you is not available. Once you are discharged, your primary care physician will handle any further medical issues. Please note that NO REFILLS for  any discharge medications will be authorized once you are discharged, as it is imperative that you return to your primary care physician (or establish a relationship with a primary care physician if you do not have one) for your aftercare needs so that they can reassess your need for medications and monitor your lab values.    On the day of Discharge:  VITAL SIGNS:   Blood pressure 140/71, pulse 90, temperature 98.2 F (36.8 C), temperature source Oral, resp. rate 10, height 6\' 1"  (1.854 m), weight 79.5 kg (175 lb 4.3 oz), SpO2 97 %.  PHYSICAL EXAMINATION:   GENERAL:  56 y.o.-year-old patient lying in the bed with no acute distress. Chronically ill-appearing EYES: Pupils equal, round, reactive to light and accommodation. No scleral icterus. Extraocular muscles intact.  HEENT: Head atraumatic, normocephalic. Oropharynx and nasopharynx clear. No teeth present NECK:  Supple, no jugular venous distention. No thyroid enlargement, no tenderness.  LUNGS: Normal breath sounds bilaterally, no wheezing, rales,rhonchi or crepitation. No use of accessory muscles of respiration. Decreased bibasilar breath sounds  CARDIOVASCULAR: S1, S2 normal. No murmurs, rubs, or gallops.  ABDOMEN: Soft, nontender, nondistended. Bowel sounds present. No organomegaly or mass.  EXTREMITIES: No pedal edema, cyanosis, or clubbing. Left arm dialysis catheter NEUROLOGIC: Cranial nerves II through XII are intact. Muscle strength 5/5 in all extremities. Sensation intact. Gait not checked.  PSYCHIATRIC: The patient is alert and oriented x 3.  SKIN: No obvious rash, lesion, or ulcer.   DATA REVIEW:   CBC  Recent Labs Lab 08/17/16 0950  WBC 6.9  HGB 11.0*  HCT 31.5*  PLT 302    Chemistries   Recent Labs Lab 08/17/16 1021  NA 125*  K 4.9  CL 89*  CO2 25  GLUCOSE 131*  BUN 51*  CREATININE 7.10*  CALCIUM 9.2     Microbiology Results  Results for orders placed or performed during the hospital encounter of  08/03/16  MRSA PCR Screening     Status: None   Collection Time: 08/03/16  6:09 PM  Result Value Ref Range Status   MRSA by PCR NEGATIVE NEGATIVE Final    Comment:        The GeneXpert MRSA Assay (FDA approved for NASAL specimens only), is one component of a comprehensive MRSA colonization surveillance program. It is not intended to diagnose MRSA infection nor to guide or monitor treatment for MRSA infections.     RADIOLOGY:  No results found.   Management plans discussed with the patient, family and they are in agreement.  CODE STATUS:     Code Status Orders        Start     Ordered   08/03/16 1422  Full code  Continuous     08/03/16 1426    Code Status History    Date Active Date Inactive Code Status Order ID Comments User Context   05/26/2016  5:41 PM 05/31/2016  5:48 PM Full Code [id number]  Max Sane, MD Inpatient   05/16/2015 11:43 PM 05/20/2015  6:52 PM Full Code [id number]  Lytle Butte, MD ED   10/20/2014  9:01 PM 10/31/2014  8:02 PM Full Code [id number]  Lytle Butte, MD Inpatient      TOTAL TIME TAKING CARE OF THIS PATIENT: 38 minutes.    Gladstone Lighter M.D on 08/17/2016 at 3:43 PM  Between 7am to 6pm - Pager - [phone]  After 6pm go to [email or web address] - Proofreader  [facility] [place] [facility]  Office  [phone]  CC: Primary care physician; Nino Glow McLean-Scocuzza, MD   Note: This dictation was prepared with Dragon dictation along with smaller phrase technology. Any transcriptional errors that result from this process are unintentional.

## 2016-08-17 NOTE — Progress Notes (Signed)
Start of hd 

## 2016-08-17 NOTE — Progress Notes (Signed)
Post hd vitals 

## 2016-08-17 NOTE — Care Management (Signed)
Notified [facility] of anticipated discharge today.

## 2016-08-18 LAB — GLUCOSE, CAPILLARY: GLUCOSE-CAPILLARY: 73 mg/dL (ref 65–99)

## 2016-08-18 NOTE — Progress Notes (Signed)
Patient discharged home with home health,instructions explained and well understood,follow up appointment made,escorted by volunteer staff via wheel chair.

## 2016-08-18 NOTE — Care Management (Addendum)
Notified [facility] of discharge today.  [facility] will discuss transportation home and call unit.  [facility] Team member Delrae Alfred has the key to patient's residence. Notified [facility]

## 2016-08-21 NOTE — Discharge Summary (Signed)
Todd [name] at [place] NAME: Todd [name]    MR#:  [id number]  DATE OF BIRTH:  1960-11-20  DATE OF ADMISSION:  08/03/2016   ADMITTING PHYSICIAN: Wilhelmina Mcardle, MD  DATE OF DISCHARGE: 08/18/2017  PRIMARY CARE PHYSICIAN: Nino Glow McLean-Scocuzza, MD   ADMISSION DIAGNOSIS:   End stage renal disease (Todd [name]) [N18.6] Hyperkalemia [E87.5] Cardiac arrest due to other underlying condition (Todd [name]) [I46.8]  DISCHARGE DIAGNOSIS:   Active Problems:   Cardiac arrest (Todd [name])   SECONDARY DIAGNOSIS:   Past Medical History:  Diagnosis Date  . Anemia   . Diabetes mellitus without complication (Todd [name])   . Dialysis patient [facility])    Mon. -Wed.- Fri  . ESRD (end stage renal disease) (Todd [name])   . Hyperlipidemia   . Malignant hypertension   . Renal artery stenosis (Todd [name])   . Schizophrenia [name])     [name] COURSE:   56 year old African-American male with past medical history significant for schizophrenia, end-stage renal disease on Monday Wednesday Friday hemodialysis, diabetes mellitus, hypertension, hepatitis C, anemia of chronic disease presents to [name] with cardiac arrest.  #1 acute right occlusive cephalic and basilic vein thrombosis-was on heparin drip. And bridged with Coumadin. -INR is therapeutic at 2.3. Will be discharged on oral coumadin and INR check in 2 days  #2 End stage renal disease on Monday Wednesday Friday hemodialysis-as per schedule. dialysis prior to discharge on 08/17/16. -Appreciate nephrology consult  #3 cardiac arrest-secondary to hyperkalemia. Patient had respiratory failure secondary to cardiac arrest with prolonged resuscitation. -Appreciate intensivist consult. Patient doing well at this time. Potassium regulated via dialysis. -Dietary potassium restriction educated. -Breathing on room air. Continue incentive spirometry.  - meds for musculoskeletal chest pain added.  #4 history of schizophrenia-continue home  medications.  #5 hypertension-continue clonidine, oral hydralazine, minoxidil and Norvasc. No ACEI/ARB due to hyperkalemia on admission causing cardiac arrest Increased clonidine dose  #6 anemia of chronic disease-on  Epogen injections with dialysis  Patient actually got discharged on 08/18/2016 as he had right issues on the day prior.   DISCHARGE CONDITIONS:   Guarded  CONSULTS OBTAINED:   Treatment Team:  Murlean Iba, MD Wilhelmina Mcardle, MD Algernon Huxley, MD  DRUG ALLERGIES:   Allergies  Allergen Reactions  . Thorazine [Chlorpromazine] Other (See Comments)    Reaction:  Unknown , pt states it makes him feel real bad   DISCHARGE MEDICATIONS:   Allergies as of 08/18/2016      Reactions   Thorazine [chlorpromazine] Other (See Comments)   Reaction:  Unknown , pt states it makes him feel real bad      Medication List    STOP taking these medications   ADVAIR DISKUS 250-50 MCG/DOSE Aepb Generic drug:  Fluticasone-Salmeterol   amoxicillin-clavulanate 500-125 MG tablet Commonly known as:  AUGMENTIN   atenolol 25 MG tablet Commonly known as:  TENORMIN   citalopram 20 MG tablet Commonly known as:  CELEXA   traMADol 50 MG tablet Commonly known as:  ULTRAM     TAKE these medications   acetaminophen 500 MG tablet Commonly known as:  TYLENOL Take 500 mg by mouth every 4 (four) hours as needed for mild pain. Take 1 tablet every 4-6 hours as needed for pain.   amLODipine 10 MG tablet Commonly known as:  NORVASC Take 10 mg by mouth daily.   b complex-vitamin c-folic acid 0.8 MG Tabs tablet Take 1 tablet by mouth daily.   baclofen 10 MG tablet  Commonly known as:  LIORESAL Take 1 tablet (10 mg total) by mouth 2 (two) times daily as needed for muscle spasms.   benzonatate 200 MG capsule Commonly known as:  TESSALON Take 1 capsule (200 mg total) by mouth 2 (two) times daily as needed for cough.   BREO ELLIPTA 100-25 MCG/INH Aepb Generic drug:  fluticasone  furoate-vilanterol Inhale 1 puff into the lungs daily.   calcium acetate 667 MG capsule Commonly known as:  PHOSLO Take 2 capsules (1,334 mg total) by mouth 3 (three) times daily with meals. What changed:  Another medication with the same name was removed. Continue taking this medication, and follow the directions you see here.   carbamazepine 200 MG tablet Commonly known as:  TEGRETOL Take 2.5 tablets (500 mg total) by mouth 2 (two) times daily. What changed:  additional instructions  Another medication with the same name was removed. Continue taking this medication, and follow the directions you see here.   cholecalciferol 1000 units tablet Commonly known as:  VITAMIN D Take 1,000 Units by mouth daily.   cloNIDine 0.3 MG tablet Commonly known as:  CATAPRES Take 1 tablet (0.3 mg total) by mouth 3 (three) times daily. What changed:  medication strength  how much to take   cyclobenzaprine 5 MG tablet Commonly known as:  FLEXERIL Take 5 mg by mouth at bedtime as needed for muscle spasms.   docusate sodium 100 MG capsule Commonly known as:  COLACE Take 1 capsule (100 mg total) by mouth daily.   ferrous sulfate 325 (65 FE) MG tablet Take 1 tablet (325 mg total) by mouth daily.   fluPHENAZine 2.5 MG tablet Commonly known as:  PROLIXIN Take 2.5 mg by mouth daily.   hydrALAZINE 100 MG tablet Commonly known as:  APRESOLINE Take 1 tablet (100 mg total) by mouth every 6 (six) hours. What changed:  when to take this   lidocaine 5 % Commonly known as:  LIDODERM Place 1 patch onto the skin daily. Remove & Discard patch within 12 hours or as directed by MD   minoxidil 10 MG tablet Commonly known as:  LONITEN Take 1 tablet (10 mg total) by mouth daily. What changed:  medication strength  how much to take   oxyCODONE-acetaminophen 7.5-325 MG tablet Commonly known as:  PERCOCET Take 1 tablet by mouth every 4 (four) hours as needed for moderate pain or severe pain.     PROAIR HFA 108 (90 Base) MCG/ACT inhaler Generic drug:  albuterol Inhale 1-2 puffs into the lungs every 6 (six) hours as needed for wheezing or shortness of breath (cough).   trihexyphenidyl 5 MG tablet Commonly known as:  ARTANE Take 5 mg by mouth 3 (three) times daily with meals.   warfarin 7.5 MG tablet Commonly known as:  COUMADIN Take 1 tablet (7.5 mg total) by mouth one time only at 6 PM.        DISCHARGE INSTRUCTIONS:   1. Nephrology f/u for dialysis in 2 days 2. INR check and potassium check in 2 days 3. PCP f/u in 1 week  DIET:   Renal diet  ACTIVITY:   Activity as tolerated  OXYGEN:   Home Oxygen: No.  Oxygen Delivery: room air  DISCHARGE LOCATION:   home with home health services  If you experience worsening of your admission symptoms, develop shortness of breath, life threatening emergency, suicidal or homicidal thoughts you must seek medical attention immediately by calling 911 or calling your MD immediately  if symptoms less  severe.  You Must read complete instructions/literature along with all the possible adverse reactions/side effects for all the Medicines you take and that have been prescribed to you. Take any new Medicines after you have completely understood and accpet all the possible adverse reactions/side effects.   Please note  You were cared for by a hospitalist during your [name] stay. If you have any questions about your discharge medications or the care you received while you were in the [name] after you are discharged, you can call the unit and asked to speak with the hospitalist on call if the hospitalist that took care of you is not available. Once you are discharged, your primary care physician will handle any further medical issues. Please note that NO REFILLS for any discharge medications will be authorized once you are discharged, as it is imperative that you return to your primary care physician (or establish a relationship with a  primary care physician if you do not have one) for your aftercare needs so that they can reassess your need for medications and monitor your lab values.    On the day of Discharge:  VITAL SIGNS:   Blood pressure (!) 206/99, pulse 73, temperature 98.3 F (36.8 C), temperature source Oral, resp. rate 16, height 6\' 1"  (1.854 m), weight 80.9 kg (178 lb 4.8 oz), SpO2 94 %.  PHYSICAL EXAMINATION:   GENERAL:  56 y.o.-year-old patient lying in the bed with no acute distress. Chronically ill-appearing EYES: Pupils equal, round, reactive to light and accommodation. No scleral icterus. Extraocular muscles intact.  HEENT: Head atraumatic, normocephalic. Oropharynx and nasopharynx clear. No teeth present NECK:  Supple, no jugular venous distention. No thyroid enlargement, no tenderness.  LUNGS: Normal breath sounds bilaterally, no wheezing, rales,rhonchi or crepitation. No use of accessory muscles of respiration. Decreased bibasilar breath sounds  CARDIOVASCULAR: S1, S2 normal. No murmurs, rubs, or gallops.  ABDOMEN: Soft, nontender, nondistended. Bowel sounds present. No organomegaly or mass.  EXTREMITIES: No pedal edema, cyanosis, or clubbing. Left arm dialysis catheter NEUROLOGIC: Cranial nerves II through XII are intact. Muscle strength 5/5 in all extremities. Sensation intact. Gait not checked.  PSYCHIATRIC: The patient is alert and oriented x 3.  SKIN: No obvious rash, lesion, or ulcer.   DATA REVIEW:   CBC  Recent Labs Lab 08/17/16 0950  WBC 6.9  HGB 11.0*  HCT 31.5*  PLT 302    Chemistries   Recent Labs Lab 08/17/16 1021  NA 125*  K 4.9  CL 89*  CO2 25  GLUCOSE 131*  BUN 51*  CREATININE 7.10*  CALCIUM 9.2     Microbiology Results  Results for orders placed or performed during the [name] encounter of 08/03/16  MRSA PCR Screening     Status: None   Collection Time: 08/03/16  6:09 PM  Result Value Ref Range Status   MRSA by PCR NEGATIVE NEGATIVE Final    Comment:         The GeneXpert MRSA Assay (FDA approved for NASAL specimens only), is one component of a comprehensive MRSA colonization surveillance program. It is not intended to diagnose MRSA infection nor to guide or monitor treatment for MRSA infections.     RADIOLOGY:  No results found.   Management plans discussed with the patient, family and they are in agreement.  CODE STATUS:     Code Status Orders        Start     Ordered   08/03/16 1422  Full code  Continuous  08/03/16 1426    Code Status History    Date Active Date Inactive Code Status Order ID Comments User Context   05/26/2016  5:41 PM 05/31/2016  5:48 PM Full Code [id number]  Max Sane, MD Inpatient   05/16/2015 11:43 PM 05/20/2015  6:52 PM Full Code [id number]  Lytle Butte, MD ED   10/20/2014  9:01 PM 10/31/2014  8:02 PM Full Code [id number]  Lytle Butte, MD Inpatient      TOTAL TIME TAKING CARE OF THIS PATIENT: 38 minutes.    Gladstone Lighter M.D on 08/21/2016 at 8:18 AM  Between 7am to 6pm - Pager - [phone]  After 6pm go to [email or web address] - Proofreader  [facility] [place] [facility]  Office  [phone]  CC: Primary care physician; Nino Glow McLean-Scocuzza, MD   Note: This dictation was prepared with Dragon dictation along with smaller phrase technology. Any transcriptional errors that result from this process are unintentional.

## 2016-09-10 ENCOUNTER — Other Ambulatory Visit: Payer: Self-pay | Admitting: Internal Medicine

## 2016-09-10 DIAGNOSIS — I2089 Other forms of angina pectoris: Secondary | ICD-10-CM

## 2016-09-10 DIAGNOSIS — I208 Other forms of angina pectoris: Secondary | ICD-10-CM

## 2016-09-10 DIAGNOSIS — I214 Non-ST elevation (NSTEMI) myocardial infarction: Secondary | ICD-10-CM

## 2016-09-14 ENCOUNTER — Inpatient Hospital Stay: Payer: Medicaid Other | Admitting: Oncology

## 2016-09-14 DIAGNOSIS — Z7901 Long term (current) use of anticoagulants: Secondary | ICD-10-CM | POA: Insufficient documentation

## 2016-09-14 NOTE — Progress Notes (Deleted)
Todd Brown  [phone] [phone]) [phone]  ID: Todd Brown OB: 19-Feb-1961  MR#: [id number]  VZC#:[id number]  Patient Care Team: Nino Glow McLean-Scocuzza, MD as PCP - General (Internal Medicine)  CHIEF COMPLAINT: Anticoagulation on Coumadin  INTERVAL HISTORY: ***  REVIEW OF SYSTEMS:   ROS  As per HPI. Otherwise, a complete review of systems is negative.  PAST MEDICAL HISTORY: Past Medical History:  Diagnosis Date  . Anemia   . Diabetes mellitus without complication ([place])   . Dialysis patient [facility])    Mon. -Wed.- Fri  . ESRD (end stage renal disease) ([place])   . Hyperlipidemia   . Malignant hypertension   . Renal artery stenosis ([place])   . Schizophrenia ([place])     PAST SURGICAL HISTORY: Past Surgical History:  Procedure Laterality Date  . A/V SHUNT INTERVENTION N/A 08/06/2016   Procedure: A/V Shunt Intervention;  Surgeon: Algernon Huxley, MD;  Location: [place] [facility];  Service: Cardiovascular;  Laterality: N/A;  . A/V SHUNTOGRAM N/A 08/06/2016   Procedure: A/V Fistulagram;  Surgeon: Algernon Huxley, MD;  Location: [place] [facility];  Service: Cardiovascular;  Laterality: N/A;  . AV FISTULA PLACEMENT Left 12/26/2014   Procedure: ARTERIOVENOUS (AV) FISTULA CREATION;  Surgeon: Algernon Huxley, MD;  Location: [facility];  Service: Vascular;  Laterality: Left;  . INSERTION OF DIALYSIS CATHETER Right   . PERIPHERAL VASCULAR CATHETERIZATION N/A 10/22/2014   Procedure: Dialysis/Perma Catheter Insertion;  Surgeon: Algernon Huxley, MD;  Location: [place] [facility];  Service: Cardiovascular;  Laterality: N/A;  . PERIPHERAL VASCULAR CATHETERIZATION N/A 02/28/2015   Procedure: Dialysis/Perma Catheter Removal;  Surgeon: Algernon Huxley, MD;  Location: [place] [facility];  Service: Cardiovascular;  Laterality: N/A;  . Repair fx left lower leg     yrs ago (age 46)    FAMILY HISTORY: Family History  Problem Relation Age of Onset  . Diabetes Neg Hx     ADVANCED  DIRECTIVES (Y/N):  N  HEALTH MAINTENANCE: Social History  Substance Use Topics  . Smoking status: Current Every Day Smoker    Packs/day: 0.50    Types: Cigarettes  . Smokeless tobacco: Never Used     Comment: 10 cigars a day  . Alcohol use No     Colonoscopy:  PAP:  Bone density:  Lipid panel:  Allergies  Allergen Reactions  . Thorazine [Chlorpromazine] Other (See Comments)    Reaction:  Unknown , pt states it makes him feel real bad    Current Outpatient Prescriptions  Medication Sig Dispense Refill  . acetaminophen (TYLENOL) 500 MG tablet Take 500 mg by mouth every 4 (four) hours as needed for mild pain. Take 1 tablet every 4-6 hours as needed for pain.    Marland Kitchen albuterol (PROAIR HFA) 108 (90 Base) MCG/ACT inhaler Inhale 1-2 puffs into the lungs every 6 (six) hours as needed for wheezing or shortness of breath (cough).    Marland Kitchen amLODipine (NORVASC) 10 MG tablet Take 10 mg by mouth daily.    Marland Kitchen b complex-vitamin c-folic acid (NEPHRO-VITE) 0.8 MG TABS tablet Take 1 tablet by mouth daily.    . baclofen (LIORESAL) 10 MG tablet Take 1 tablet (10 mg total) by mouth 2 (two) times daily as needed for muscle spasms. 30 each 0  . benzonatate (TESSALON) 200 MG capsule Take 1 capsule (200 mg total) by mouth 2 (two) times daily as needed for cough. 20 capsule 0  . calcium acetate (PHOSLO) 667 MG capsule  Take 2 capsules (1,334 mg total) by mouth 3 (three) times daily with meals. 60 capsule 1  . carbamazepine (TEGRETOL) 200 MG tablet Take 2.5 tablets (500 mg total) by mouth 2 (two) times daily. 60 tablet 0  . cholecalciferol (VITAMIN D) 1000 UNITS tablet Take 1,000 Units by mouth daily.    . cloNIDine (CATAPRES) 0.3 MG tablet Take 1 tablet (0.3 mg total) by mouth 3 (three) times daily. 60 tablet 11  . cyclobenzaprine (FLEXERIL) 5 MG tablet Take 5 mg by mouth at bedtime as needed for muscle spasms.    Marland Kitchen docusate sodium (COLACE) 100 MG capsule Take 1 capsule (100 mg total) by mouth daily. 10 capsule 0    . ferrous sulfate 325 (65 FE) MG tablet Take 1 tablet (325 mg total) by mouth daily. 30 tablet 3  . fluPHENAZine (PROLIXIN) 2.5 MG tablet Take 2.5 mg by mouth daily.     . fluticasone furoate-vilanterol (BREO ELLIPTA) 100-25 MCG/INH AEPB Inhale 1 puff into the lungs daily.    . hydrALAZINE (APRESOLINE) 100 MG tablet Take 1 tablet (100 mg total) by mouth every 6 (six) hours. 120 tablet 2  . lidocaine (LIDODERM) 5 % Place 1 patch onto the skin daily. Remove & Discard patch within 12 hours or as directed by MD 30 patch 0  . minoxidil (LONITEN) 10 MG tablet Take 1 tablet (10 mg total) by mouth daily. 30 tablet 2  . oxyCODONE-acetaminophen (PERCOCET) 7.5-325 MG tablet Take 1 tablet by mouth every 4 (four) hours as needed for moderate pain or severe pain. 20 tablet 0  . trihexyphenidyl (ARTANE) 5 MG tablet Take 5 mg by mouth 3 (three) times daily with meals.    . warfarin (COUMADIN) 7.5 MG tablet Take 1 tablet (7.5 mg total) by mouth one time only at 6 PM. 30 tablet 0   No current facility-administered medications for this visit.     OBJECTIVE: There were no vitals filed for this visit.   There is no height or weight on file to calculate BMI.    ECOG FS:{CHL ONC [id number]  General: Well-developed, well-nourished, no acute distress. Eyes: Pink conjunctiva, anicteric sclera. HEENT: Normocephalic, moist mucous membranes, clear oropharnyx. Lungs: Clear to auscultation bilaterally. Heart: Regular rate and rhythm. No rubs, murmurs, or gallops. Abdomen: Soft, nontender, nondistended. No organomegaly noted, normoactive bowel sounds. Musculoskeletal: No edema, cyanosis, or clubbing. Neuro: Alert, answering all questions appropriately. Cranial nerves grossly intact. Skin: No rashes or petechiae noted. Psych: Normal affect. Lymphatics: No cervical, calvicular, axillary or inguinal LAD.   LAB RESULTS:  Lab Results  Component Value Date   NA 125 (L) 08/17/2016   K 4.9 08/17/2016   CL 89 (L)  08/17/2016   CO2 25 08/17/2016   GLUCOSE 131 (H) 08/17/2016   BUN 51 (H) 08/17/2016   CREATININE 7.10 (H) 08/17/2016   CALCIUM 9.2 08/17/2016   PROT 7.1 08/03/2016   ALBUMIN 3.3 (L) 08/17/2016   AST 49 (H) 08/03/2016   ALT 47 08/03/2016   ALKPHOS 120 08/03/2016   BILITOT 0.7 08/03/2016   GFRNONAA 8 (L) 08/17/2016   GFRAA 9 (L) 08/17/2016    Lab Results  Component Value Date   WBC 6.9 08/17/2016   NEUTROABS 7.9 (H) 08/03/2016   HGB 11.0 (L) 08/17/2016   HCT 31.5 (L) 08/17/2016   MCV 104.3 (H) 08/17/2016   PLT 302 08/17/2016     STUDIES: No results found.  ASSESSMENT: Anticoagulation on Coumadin  PLAN:    1. Anticoagulation on Coumadin:  Patient expressed understanding and was in agreement with this plan. He also understands that He can call clinic at any time with any questions, concerns, or complaints.   Cancer Staging No matching staging information was found for the patient.  Lloyd Huger, MD   09/14/2016 12:07 AM

## 2016-09-15 ENCOUNTER — Ambulatory Visit
Admission: RE | Admit: 2016-09-15 | Discharge: 2016-09-15 | Disposition: A | Discharge status: Home / Self Care | Payer: Medicaid Other | Source: Ambulatory Visit | Attending: Internal Medicine | Admitting: Internal Medicine

## 2016-09-15 DIAGNOSIS — I208 Other forms of angina pectoris: Secondary | ICD-10-CM

## 2016-09-15 DIAGNOSIS — I214 Non-ST elevation (NSTEMI) myocardial infarction: Secondary | ICD-10-CM

## 2016-09-22 ENCOUNTER — Encounter: Admission: RE | Admit: 2016-09-22 | Payer: Medicaid Other | Source: Ambulatory Visit

## 2016-09-30 ENCOUNTER — Ambulatory Visit: Payer: Medicaid Other | Admitting: Oncology

## 2016-09-30 NOTE — Progress Notes (Signed)
[place]  [phone] [phone]) [phone]  ID: Todd Brown OB: 04/13/1961  MR#: [id number]  IHK#:[id number]  Patient Care Team: Nino Glow McLean-Scocuzza, MD as PCP - General (Internal Medicine)  CHIEF COMPLAINT: Anticoagulation on Coumadin  INTERVAL HISTORY: Patient is a 56 year old male with multiple medical problems including severe schizophrenia who was referred to clinic for further evaluation and management of his Coumadin. Much of the history is given by his caretaker. By report he has had multiple blood clots and his INRs have been difficult to manage. He is on dialysis, therefore no other medications can be used. He currently feels well and is at his baseline.  REVIEW OF SYSTEMS:   Review of Systems  Unable to perform ROS: Mental acuity    As per HPI. Otherwise, a complete review of systems is negative.  PAST MEDICAL HISTORY: Past Medical History:  Diagnosis Date  . Anemia   . Diabetes mellitus without complication ([place])   . Dialysis patient [facility])    Mon. -Wed.- Fri  . ESRD (end stage renal disease) ([place])   . Hyperlipidemia   . Malignant hypertension   . Renal artery stenosis ([place])   . Schizophrenia ([place])     PAST SURGICAL HISTORY: Past Surgical History:  Procedure Laterality Date  . A/V SHUNT INTERVENTION N/A 08/06/2016   Procedure: A/V Shunt Intervention;  Surgeon: Algernon Huxley, MD;  Location: [place] [facility];  Service: Cardiovascular;  Laterality: N/A;  . A/V SHUNTOGRAM N/A 08/06/2016   Procedure: A/V Fistulagram;  Surgeon: Algernon Huxley, MD;  Location: [place] [facility];  Service: Cardiovascular;  Laterality: N/A;  . AV FISTULA PLACEMENT Left 12/26/2014   Procedure: ARTERIOVENOUS (AV) FISTULA CREATION;  Surgeon: Algernon Huxley, MD;  Location: [facility];  Service: Vascular;  Laterality: Left;  . INSERTION OF DIALYSIS CATHETER Right   . PERIPHERAL VASCULAR CATHETERIZATION N/A 10/22/2014   Procedure: Dialysis/Perma Catheter  Insertion;  Surgeon: Algernon Huxley, MD;  Location: [place] [facility];  Service: Cardiovascular;  Laterality: N/A;  . PERIPHERAL VASCULAR CATHETERIZATION N/A 02/28/2015   Procedure: Dialysis/Perma Catheter Removal;  Surgeon: Algernon Huxley, MD;  Location: [place] [facility];  Service: Cardiovascular;  Laterality: N/A;  . Repair fx left lower leg     yrs ago (age 89)    FAMILY HISTORY: Family History  Problem Relation Age of Onset  . Diabetes Neg Hx     ADVANCED DIRECTIVES (Y/N):  N  HEALTH MAINTENANCE: Social History  Substance Use Topics  . Smoking status: Current Every Day Smoker    Packs/day: 0.50    Types: Cigarettes  . Smokeless tobacco: Never Used     Comment: 10 cigars a day  . Alcohol use No     Colonoscopy:  PAP:  Bone density:  Lipid panel:  Allergies  Allergen Reactions  . Thorazine [Chlorpromazine] Other (See Comments)    Reaction:  Unknown , pt states it makes him feel real bad    Current Outpatient Prescriptions  Medication Sig Dispense Refill  . acetaminophen (TYLENOL) 500 MG tablet Take 500 mg by mouth every 4 (four) hours as needed for mild pain. Take 1 tablet every 4-6 hours as needed for pain.    Marland Kitchen albuterol (PROAIR HFA) 108 (90 Base) MCG/ACT inhaler Inhale 1-2 puffs into the lungs every 6 (six) hours as needed for wheezing or shortness of breath (cough).    Marland Kitchen amLODipine (NORVASC) 10 MG tablet Take 10 mg by mouth daily.    Marland Kitchen  b complex-vitamin c-folic acid (NEPHRO-VITE) 0.8 MG TABS tablet Take 1 tablet by mouth daily.    . baclofen (LIORESAL) 10 MG tablet Take 1 tablet (10 mg total) by mouth 2 (two) times daily as needed for muscle spasms. 30 each 0  . benzonatate (TESSALON) 200 MG capsule Take 1 capsule (200 mg total) by mouth 2 (two) times daily as needed for cough. 20 capsule 0  . calcium acetate (PHOSLO) 667 MG capsule Take 2 capsules (1,334 mg total) by mouth 3 (three) times daily with meals. 60 capsule 1  . carbamazepine (TEGRETOL) 200 MG tablet  Take 2.5 tablets (500 mg total) by mouth 2 (two) times daily. 60 tablet 0  . cholecalciferol (VITAMIN D) 1000 UNITS tablet Take 1,000 Units by mouth daily.    . cloNIDine (CATAPRES) 0.3 MG tablet Take 1 tablet (0.3 mg total) by mouth 3 (three) times daily. 60 tablet 11  . cyclobenzaprine (FLEXERIL) 5 MG tablet Take 5 mg by mouth at bedtime as needed for muscle spasms.    Marland Kitchen docusate sodium (COLACE) 100 MG capsule Take 1 capsule (100 mg total) by mouth daily. 10 capsule 0  . ferrous sulfate 325 (65 FE) MG tablet Take 1 tablet (325 mg total) by mouth daily. 30 tablet 3  . fluPHENAZine (PROLIXIN) 2.5 MG tablet Take 2.5 mg by mouth daily.     . fluticasone furoate-vilanterol (BREO ELLIPTA) 100-25 MCG/INH AEPB Inhale 1 puff into the lungs daily.    . hydrALAZINE (APRESOLINE) 100 MG tablet Take 1 tablet (100 mg total) by mouth every 6 (six) hours. 120 tablet 2  . lidocaine (LIDODERM) 5 % Place 1 patch onto the skin daily. Remove & Discard patch within 12 hours or as directed by MD 30 patch 0  . minoxidil (LONITEN) 10 MG tablet Take 1 tablet (10 mg total) by mouth daily. 30 tablet 2  . oxyCODONE-acetaminophen (PERCOCET) 7.5-325 MG tablet Take 1 tablet by mouth every 4 (four) hours as needed for moderate pain or severe pain. 20 tablet 0  . trihexyphenidyl (ARTANE) 5 MG tablet Take 5 mg by mouth 3 (three) times daily with meals.    . warfarin (COUMADIN) 7.5 MG tablet Take 1 tablet (7.5 mg total) by mouth one time only at 6 PM. 30 tablet 0  . warfarin (COUMADIN) 10 MG tablet Take 1 tablet (10 mg total) by mouth daily. 30 tablet 2   No current facility-administered medications for this visit.     OBJECTIVE: Vitals:   10/01/16 1117 10/01/16 1124  BP: (!) 113/58   Pulse: 89   Resp:  20  Temp: 97.8 F (36.6 C)      Body mass index is 22.7 kg/m.    ECOG FS:1 - Symptomatic but completely ambulatory  General: Well-developed, well-nourished, no acute distress. Eyes: Pink conjunctiva, anicteric  sclera. HEENT: Normocephalic, moist mucous membranes, clear oropharnyx. Lungs: Clear to auscultation bilaterally. Heart: Regular rate and rhythm. No rubs, murmurs, or gallops. Abdomen: Soft, nontender, nondistended. No organomegaly noted, normoactive bowel sounds. Musculoskeletal: No edema, cyanosis, or clubbing. Neuro: Alert, answering all questions appropriately. Cranial nerves grossly intact. Skin: No rashes or petechiae noted. Psych: Normal affect. Lymphatics: No cervical, calvicular, axillary or inguinal LAD.   LAB RESULTS:  Lab Results  Component Value Date   NA 125 (L) 08/17/2016   K 4.9 08/17/2016   CL 89 (L) 08/17/2016   CO2 25 08/17/2016   GLUCOSE 131 (H) 08/17/2016   BUN 51 (H) 08/17/2016   CREATININE 7.10 (H)  08/17/2016   CALCIUM 9.2 08/17/2016   PROT 7.1 08/03/2016   ALBUMIN 3.3 (L) 08/17/2016   AST 49 (H) 08/03/2016   ALT 47 08/03/2016   ALKPHOS 120 08/03/2016   BILITOT 0.7 08/03/2016   GFRNONAA 8 (L) 08/17/2016   GFRAA 9 (L) 08/17/2016    Lab Results  Component Value Date   WBC 6.9 08/17/2016   NEUTROABS 7.9 (H) 08/03/2016   HGB 11.0 (L) 08/17/2016   HCT 31.5 (L) 08/17/2016   MCV 104.3 (H) 08/17/2016   PLT 302 08/17/2016     STUDIES: No results found.  ASSESSMENT: Anticoagulation on Coumadin  PLAN:    1. Anticoagulation on Coumadin:  Patient is currently taking 7.5 mg Coumadin daily with an INR of 1.63. Goal INR will be 2-3. Have instructed patient to increase his Coumadin dose to 10 mg daily. Return to clinic weekly for laboratory work and adjustment of Coumadin dosing as indicated. Once patient is on a stable dose of Coumadin, no further follow-up will be necessary and further monitoring and evaluation can be completed by primary care.  Approximately 45 minutes was spent in discussion of which greater than 50% was consultation.  Patient expressed understanding and was in agreement with this plan. He also understands that He can call clinic at  any time with any questions, concerns, or complaints.    Todd Huger, MD   10/01/2016 12:18 PM

## 2016-10-01 ENCOUNTER — Telehealth: Payer: Self-pay

## 2016-10-01 ENCOUNTER — Inpatient Hospital Stay: Payer: Medicaid Other

## 2016-10-01 ENCOUNTER — Inpatient Hospital Stay: Payer: Medicaid Other | Attending: Oncology | Admitting: Oncology

## 2016-10-01 VITALS — BP 113/58 | HR 89 | Temp 97.8°F | Resp 20 | Ht 75.0 in | Wt 181.6 lb

## 2016-10-01 DIAGNOSIS — F209 Schizophrenia, unspecified: Secondary | ICD-10-CM | POA: Insufficient documentation

## 2016-10-01 DIAGNOSIS — F17210 Nicotine dependence, cigarettes, uncomplicated: Secondary | ICD-10-CM | POA: Insufficient documentation

## 2016-10-01 DIAGNOSIS — Z7901 Long term (current) use of anticoagulants: Secondary | ICD-10-CM

## 2016-10-01 DIAGNOSIS — I701 Atherosclerosis of renal artery: Secondary | ICD-10-CM | POA: Insufficient documentation

## 2016-10-01 DIAGNOSIS — I129 Hypertensive chronic kidney disease with stage 1 through stage 4 chronic kidney disease, or unspecified chronic kidney disease: Secondary | ICD-10-CM

## 2016-10-01 DIAGNOSIS — Z992 Dependence on renal dialysis: Secondary | ICD-10-CM | POA: Insufficient documentation

## 2016-10-01 DIAGNOSIS — Z862 Personal history of diseases of the blood and blood-forming organs and certain disorders involving the immune mechanism: Secondary | ICD-10-CM | POA: Diagnosis present

## 2016-10-01 DIAGNOSIS — E785 Hyperlipidemia, unspecified: Secondary | ICD-10-CM | POA: Diagnosis not present

## 2016-10-01 DIAGNOSIS — N186 End stage renal disease: Secondary | ICD-10-CM | POA: Diagnosis not present

## 2016-10-01 DIAGNOSIS — Z79899 Other long term (current) drug therapy: Secondary | ICD-10-CM | POA: Diagnosis not present

## 2016-10-01 LAB — PROTIME-INR
INR: 1.63
Prothrombin Time: 19.5 seconds — ABNORMAL HIGH (ref 11.4–15.2)

## 2016-10-01 MED ORDER — WARFARIN SODIUM 10 MG PO TABS: 10.0000 mg | ORAL_TABLET | Freq: Every day | ORAL | 2 refills | Status: DC

## 2016-10-01 NOTE — Telephone Encounter (Signed)
Patient had appointment at 10 O clock patient was dropped off and the care taker arrived about 10:30 or 10:45 according to registration. We assumed patient was a no show and was not going to be rescheduled due to 2nd no show. Nurse with patient states she was here at 10:18 but none of our staff was aware she was here she showed up late.

## 2016-10-01 NOTE — Progress Notes (Signed)
Patient here for coumadin management status post hospitalization.

## 2016-10-06 ENCOUNTER — Inpatient Hospital Stay: Payer: Medicaid Other

## 2016-10-06 ENCOUNTER — Ambulatory Visit
Admission: RE | Admit: 2016-10-06 | Discharge: 2016-10-06 | Disposition: A | Discharge status: Home / Self Care | Payer: Medicaid Other | Source: Ambulatory Visit | Attending: Internal Medicine | Admitting: Internal Medicine

## 2016-10-06 DIAGNOSIS — I214 Non-ST elevation (NSTEMI) myocardial infarction: Secondary | ICD-10-CM | POA: Diagnosis not present

## 2016-10-06 DIAGNOSIS — I208 Other forms of angina pectoris: Secondary | ICD-10-CM | POA: Insufficient documentation

## 2016-10-06 DIAGNOSIS — Z862 Personal history of diseases of the blood and blood-forming organs and certain disorders involving the immune mechanism: Secondary | ICD-10-CM | POA: Diagnosis not present

## 2016-10-06 DIAGNOSIS — Z7901 Long term (current) use of anticoagulants: Secondary | ICD-10-CM

## 2016-10-06 LAB — PROTIME-INR
INR: 1.79
Prothrombin Time: 21 seconds — ABNORMAL HIGH (ref 11.4–15.2)

## 2016-10-06 MED ORDER — TECHNETIUM TC 99M TETROFOSMIN IV KIT
11.9300 | PACK | Freq: Once | INTRAVENOUS | Status: AC | PRN
  Administered 2016-10-06: 11.93 via INTRAVENOUS

## 2016-10-06 MED ORDER — REGADENOSON 0.4 MG/5ML IV SOLN
0.4000 mg | Freq: Once | INTRAVENOUS | Status: AC
  Administered 2016-10-06: 0.4 mg via INTRAVENOUS

## 2016-10-06 MED ORDER — TECHNETIUM TC 99M TETROFOSMIN IV KIT
30.5670 | PACK | Freq: Once | INTRAVENOUS | Status: AC | PRN
  Administered 2016-10-06: 30.567 via INTRAVENOUS

## 2016-10-07 ENCOUNTER — Ambulatory Visit: Payer: Medicaid Other

## 2016-10-07 LAB — NM MYOCAR MULTI W/SPECT W/WALL MOTION / EF
CHL CUP NUCLEAR SDS: 2
CHL CUP RESTING HR STRESS: 88 {beats}/min
LVDIAVOL: 88 mL (ref 62–150)
LVSYSVOL: 27 mL
NUC STRESS TID: 1
Peak HR: 105 {beats}/min
SRS: 2
SSS: 2

## 2016-10-15 ENCOUNTER — Inpatient Hospital Stay: Payer: Medicaid Other

## 2016-10-15 DIAGNOSIS — Z862 Personal history of diseases of the blood and blood-forming organs and certain disorders involving the immune mechanism: Secondary | ICD-10-CM | POA: Diagnosis not present

## 2016-10-15 DIAGNOSIS — Z7901 Long term (current) use of anticoagulants: Secondary | ICD-10-CM

## 2016-10-15 LAB — PROTIME-INR
INR: 1.66
Prothrombin Time: 19.8 seconds — ABNORMAL HIGH (ref 11.4–15.2)

## 2016-10-20 ENCOUNTER — Ambulatory Visit (INDEPENDENT_AMBULATORY_CARE_PROVIDER_SITE_OTHER): Payer: Medicaid Other

## 2016-10-20 ENCOUNTER — Encounter (INDEPENDENT_AMBULATORY_CARE_PROVIDER_SITE_OTHER): Payer: Self-pay | Admitting: Vascular Surgery

## 2016-10-20 ENCOUNTER — Ambulatory Visit (INDEPENDENT_AMBULATORY_CARE_PROVIDER_SITE_OTHER): Payer: Medicaid Other | Admitting: Vascular Surgery

## 2016-10-20 VITALS — BP 121/73 | HR 73 | Resp 16 | Wt 177.0 lb

## 2016-10-20 DIAGNOSIS — N186 End stage renal disease: Secondary | ICD-10-CM | POA: Diagnosis not present

## 2016-10-20 DIAGNOSIS — E875 Hyperkalemia: Secondary | ICD-10-CM | POA: Diagnosis not present

## 2016-10-20 DIAGNOSIS — I10 Essential (primary) hypertension: Secondary | ICD-10-CM | POA: Diagnosis not present

## 2016-10-20 NOTE — Assessment & Plan Note (Signed)
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 No high-grade stenosis was identified.  Continue to use the fistula and would be helpful if the access sites could be rotated more. Plan to recheck in 6 months.

## 2016-10-20 NOTE — Assessment & Plan Note (Signed)
Felt to be diet related. Fistula has good flow.

## 2016-10-20 NOTE — Assessment & Plan Note (Signed)
blood pressure control important in reducing the progression of atherosclerotic disease. On appropriate oral medications.  

## 2016-10-20 NOTE — Progress Notes (Signed)
MRN : [id number]  Todd Brown is a 56 y.o. (Mar 31, 1961) male who presents with chief complaint of  Chief Complaint  Patient presents with  . Follow-up  .  History of Present Illness: Patient returns today in follow up of Left arm AV fistula. His access sites remain aneurysmal, but his fistula has been running well without obvious problems. He had hyperkalemia issue several weeks ago but this is felt to be more diet related after a thorough workup. 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 No high-grade stenosis was identified.  Current Outpatient Prescriptions  Medication Sig Dispense Refill  . acetaminophen (TYLENOL) 500 MG tablet Take 500 mg by mouth every 4 (four) hours as needed for mild pain. Take 1 tablet every 4-6 hours as needed for pain.    Marland Kitchen albuterol (PROAIR HFA) 108 (90 Base) MCG/ACT inhaler Inhale 1-2 puffs into the lungs every 6 (six) hours as needed for wheezing or shortness of breath (cough).    Marland Kitchen amLODipine (NORVASC) 10 MG tablet Take 10 mg by mouth daily.    Marland Kitchen b complex-vitamin c-folic acid (NEPHRO-VITE) 0.8 MG TABS tablet Take 1 tablet by mouth daily.    . baclofen (LIORESAL) 10 MG tablet Take 1 tablet (10 mg total) by mouth 2 (two) times daily as needed for muscle spasms. 30 each 0  . benzonatate (TESSALON) 200 MG capsule Take 1 capsule (200 mg total) by mouth 2 (two) times daily as needed for cough. 20 capsule 0  . calcium acetate (PHOSLO) 667 MG capsule Take 2 capsules (1,334 mg total) by mouth 3 (three) times daily with meals. 60 capsule 1  . carbamazepine (TEGRETOL) 200 MG tablet Take 2.5 tablets (500 mg total) by mouth 2 (two) times daily. 60 tablet 0  . cholecalciferol (VITAMIN D) 1000 UNITS tablet Take 1,000 Units by mouth daily.    . cloNIDine (CATAPRES) 0.3 MG tablet Take 1 tablet (0.3 mg total) by mouth 3 (three) times daily. 60 tablet 11  . cyclobenzaprine  (FLEXERIL) 5 MG tablet Take 5 mg by mouth at bedtime as needed for muscle spasms.    Marland Kitchen docusate sodium (COLACE) 100 MG capsule Take 1 capsule (100 mg total) by mouth daily. 10 capsule 0  . ferrous sulfate 325 (65 FE) MG tablet Take 1 tablet (325 mg total) by mouth daily. 30 tablet 3  . fluPHENAZine (PROLIXIN) 2.5 MG tablet Take 2.5 mg by mouth daily.     . fluticasone furoate-vilanterol (BREO ELLIPTA) 100-25 MCG/INH AEPB Inhale 1 puff into the lungs daily.    . hydrALAZINE (APRESOLINE) 100 MG tablet Take 1 tablet (100 mg total) by mouth every 6 (six) hours. 120 tablet 2  . lidocaine (LIDODERM) 5 % Place 1 patch onto the skin daily. Remove & Discard patch within 12 hours or as directed by MD 30 patch 0  . minoxidil (LONITEN) 10 MG tablet Take 1 tablet (10 mg total) by mouth daily. 30 tablet 2  . oxyCODONE-acetaminophen (PERCOCET) 7.5-325 MG tablet Take 1 tablet by mouth every 4 (four) hours as needed for moderate pain or severe pain. 20 tablet 0  . trihexyphenidyl (ARTANE) 5 MG tablet Take 5 mg by mouth 3 (three) times daily with meals.    . warfarin (COUMADIN) 10 MG tablet Take 1 tablet (10 mg total) by mouth daily. 30 tablet 2  . warfarin (COUMADIN) 7.5 MG tablet Take 1 tablet (7.5 mg total) by  mouth one time only at 6 PM. 30 tablet 0   No current facility-administered medications for this visit.     Past Medical History:  Diagnosis Date  . Anemia   . Diabetes mellitus without complication ([place])   . Dialysis patient [facility])    Mon. -Wed.- Fri  . ESRD (end stage renal disease) ([place])   . Hyperlipidemia   . Malignant hypertension   . Renal artery stenosis ([place])   . Schizophrenia [facility])     Past Surgical History:  Procedure Laterality Date  . A/V SHUNT INTERVENTION N/A 08/06/2016   Procedure: A/V Shunt Intervention;  Surgeon: Algernon Huxley, MD;  Location: [facility];  Service: Cardiovascular;  Laterality: N/A;  . A/V SHUNTOGRAM N/A 08/06/2016   Procedure: A/V Fistulagram;  Surgeon:  Algernon Huxley, MD;  Location: [place] [facility];  Service: Cardiovascular;  Laterality: N/A;  . AV FISTULA PLACEMENT Left 12/26/2014   Procedure: ARTERIOVENOUS (AV) FISTULA CREATION;  Surgeon: Algernon Huxley, MD;  Location: [facility] ORS;  Service: Vascular;  Laterality: Left;  . INSERTION OF DIALYSIS CATHETER Right   . PERIPHERAL VASCULAR CATHETERIZATION N/A 10/22/2014   Procedure: Dialysis/Perma Catheter Insertion;  Surgeon: Algernon Huxley, MD;  Location: [facility];  Service: Cardiovascular;  Laterality: N/A;  . PERIPHERAL VASCULAR CATHETERIZATION N/A 02/28/2015   Procedure: Dialysis/Perma Catheter Removal;  Surgeon: Algernon Huxley, MD;  Location: [facility];  Service: Cardiovascular;  Laterality: N/A;  . Repair fx left lower leg     yrs ago (age 28)    Social History Social History  Substance Use Topics  . Smoking status: Current Every Day Smoker    Packs/day: 0.50    Types: Cigarettes  . Smokeless tobacco: Never Used     Comment: 10 cigars a day  . Alcohol use No    Family History Family History  Problem Relation Age of Onset  . Diabetes Neg Hx     Allergies  Allergen Reactions  . Thorazine [Chlorpromazine] Other (See Comments)    Reaction:  Unknown , pt states it makes him feel real bad     REVIEW OF SYSTEMS (Negative unless checked)  Constitutional: _0 Weight loss  _1 Fever  _2 Chills Cardiac: _3 Chest pain   _4 Chest pressure   _5 Palpitations   _6 Shortness of breath when laying flat   _7 Shortness of breath at rest   _8 Shortness of breath with exertion. Vascular:  _9 Pain in legs with walking   _10 Pain in legs at rest   _11 Pain in legs when laying flat   _12 Claudication   _13 Pain in feet when walking  _14 Pain in feet at rest  _15 Pain in feet when laying flat   _16 History of DVT   _17 Phlebitis   _18 Swelling in legs   _19 Varicose veins   _20 Non-healing ulcers Pulmonary:   _21 Uses home oxygen   _22 Productive cough   _23 Hemoptysis   _24 Wheeze  _25 COPD   _26 Asthma Neurologic:  _27 Dizziness   _28 Blackouts   _29 Seizures   _30 History of stroke   _31 History of TIA  _32 Aphasia   _33 Temporary blindness   _34 Dysphagia   _35 Weakness or numbness in arms   _36 Weakness or numbness in legs Musculoskeletal:  _37 Arthritis   _38 Joint swelling   _39 Joint pain   _40 Low back pain Hematologic:  _41 Easy bruising  _42 Easy bleeding   _43 Hypercoagulable state   _44 Anemic   Gastrointestinal:  _45 Blood in stool   _46 Vomiting blood  _47 Gastroesophageal reflux/heartburn   _48 Abdominal pain Genitourinary:  _49 Chronic kidney disease   _50 Difficult  urination  _0 Frequent urination  _1 Burning with urination   _2 Hematuria Skin:  _3 Rashes   _4 Ulcers   _5 Wounds Psychological:  _6 History of anxiety   _7  History of major depression.  Physical Examination  BP 121/73   Pulse 73   Resp 16   Wt 177 lb (80.3 kg)   BMI 22.12 kg/m  Gen:  WD/WN, NAD Head: [place]/AT, No temporalis wasting. Ear/Nose/Throat: Hearing grossly intact, nares w/o erythema or drainage, trachea midline Eyes: Conjunctiva clear. Sclera non-icteric Neck: Supple.  No JVD.  Pulmonary:  Good air movement, no use of accessory muscles.  Cardiac: RRR, normal S1, S2 Vascular: Good thrill in left brachiocephalic AV fistula Vessel Right Left  Radial Palpable Palpable                                   Gastrointestinal: soft, non-tender/non-distended.  Musculoskeletal: M/S 5/5 throughout.  No deformity or atrophy. Walks with a walker Neurologic: Sensation grossly intact in extremities.  Symmetrical.  Speech is fluent.  Psychiatric: Insight and judgment appear fair. Normal affect and mood Dermatologic: No rashes or ulcers noted.  No cellulitis or open wounds.       Labs Recent Results (from the past 2160 hour(s))  CBC with Differential     Status: Abnormal   Collection Time: 08/03/16  1:16 PM  Result Value Ref Range   WBC 9.2 3.8 - 10.6 K/uL   RBC 3.47 (L) 4.40 - 5.90 MIL/uL   Hemoglobin 12.7 (L) 13.0 - 18.0 g/dL   HCT 37.1 (L) 40.0 - 52.0 %   MCV 107.0 (H)  80.0 - 100.0 fL   MCH 36.6 (H) 26.0 - 34.0 pg   MCHC 34.2 32.0 - 36.0 g/dL   RDW 16.3 (H) 11.5 - 14.5 %   Platelets 282 150 - 440 K/uL   Neutrophils Relative % 87 %   Neutro Abs 7.9 (H) 1.4 - 6.5 K/uL   Lymphocytes Relative 8 %   Lymphs Abs 0.8 (L) 1.0 - 3.6 K/uL   Monocytes Relative 5 %   Monocytes Absolute 0.5 0.2 - 1.0 K/uL   Eosinophils Relative 0 %   Eosinophils Absolute 0.0 0 - 0.7 K/uL   Basophils Relative 0 %   Basophils Absolute 0.0 0 - 0.1 K/uL  Comprehensive metabolic panel     Status: Abnormal   Collection Time: 08/03/16  1:16 PM  Result Value Ref Range   Sodium 128 (L) 135 - 145 mmol/L   Potassium >7.5 (HH) 3.5 - 5.1 mmol/L    Comment: CRITICAL RESULT CALLED TO, READ BACK BY AND VERIFIED WITH JANIE BOWEN AT 1351 08/03/16 DAS    Chloride 98 (L) 101 - 111 mmol/L   CO2 20 (L) 22 - 32 mmol/L   Glucose, Bld 108 (H) 65 - 99 mg/dL   BUN 54 (H) 6 - 20 mg/dL   Creatinine, Ser 7.90 (H) 0.61 - 1.24 mg/dL   Calcium 9.1 8.9 - 10.3 mg/dL   Total Protein 7.1 6.5 - 8.1 g/dL   Albumin 3.9 3.5 - 5.0 g/dL   AST 49 (H) 15 - 41 U/L   ALT 47 17 - 63 U/L   Alkaline Phosphatase 120 38 - 126 U/L   Total Bilirubin 0.7 0.3 - 1.2 mg/dL   GFR calc non Af Amer 7 (L) >60 mL/min   GFR calc Af Amer 8 (L) >60 mL/min    Comment: (NOTE) The eGFR has been  calculated using the CKD EPI equation. This calculation has not been validated in all clinical situations. eGFR's persistently <60 mL/min signify possible Chronic Kidney Disease.    Anion gap 10 5 - 15  Troponin I     Status: None   Collection Time: 08/03/16  1:16 PM  Result Value Ref Range   Troponin I <0.03 <0.03 ng/mL  Lactic acid, plasma     Status: None   Collection Time: 08/03/16  1:16 PM  Result Value Ref Range   Lactic Acid, Venous 1.6 0.5 - 1.9 mmol/L  Glucose, capillary     Status: None   Collection Time: 08/03/16  2:00 PM  Result Value Ref Range   Glucose-Capillary 93 65 - 99 mg/dL  Glucose, capillary     Status: None    Collection Time: 08/03/16  3:36 PM  Result Value Ref Range   Glucose-Capillary 98 65 - 99 mg/dL   Comment 1 Notify RN    Comment 2 Document in Chart   Triglycerides     Status: None   Collection Time: 08/03/16  4:08 PM  Result Value Ref Range   Triglycerides 106 <150 mg/dL  HIV antibody (Routine Testing)     Status: None   Collection Time: 08/03/16  4:08 PM  Result Value Ref Range   HIV Screen 4th Generation wRfx Non Reactive Non Reactive    Comment: (NOTE) Performed At: [facility] [address] [place], [place] [id number] Lindon Romp MD [id number]   Troponin I     Status: Abnormal   Collection Time: 08/03/16  4:08 PM  Result Value Ref Range   Troponin I 0.21 (HH) <0.03 ng/mL    Comment: CRITICAL RESULT CALLED TO, READ BACK BY AND VERIFIED WITH TAYLOR FOSTER 08/03/16 @ 1659  [place]   Blood gas, arterial     Status: Abnormal   Collection Time: 08/03/16  4:18 PM  Result Value Ref Range   FIO2 0.80    Delivery systems VENTILATOR    Mode PRESSURE REGULATED VOLUME CONTROL    VT 450 mL   LHR 20 resp/min   Peep/cpap 5.0 cm H20   pH, Arterial 7.39 7.350 - 7.450   pCO2 arterial 47 32.0 - 48.0 mmHg   pO2, Arterial 108 83.0 - 108.0 mmHg   Bicarbonate 28.5 (H) 20.0 - 28.0 mmol/L   Acid-Base Excess 2.9 (H) 0.0 - 2.0 mmol/L   O2 Saturation 98.2 %   Patient temperature 37.0    Collection site RIGHT RADIAL    Sample type ARTERIAL DRAW    Allens test (pass/fail) POSITIVE (A) PASS  Potassium     Status: Abnormal   Collection Time: 08/03/16  4:28 PM  Result Value Ref Range   Potassium 6.1 (H) 3.5 - 5.1 mmol/L  MRSA PCR Screening     Status: None   Collection Time: 08/03/16  6:09 PM  Result Value Ref Range   MRSA by PCR NEGATIVE NEGATIVE    Comment:        The GeneXpert MRSA Assay (FDA approved for NASAL specimens only), is one component of a comprehensive MRSA colonization surveillance program. It is not intended to diagnose MRSA infection nor to guide  or monitor treatment for MRSA infections.   Troponin I     Status: Abnormal   Collection Time: 08/03/16  7:23 PM  Result Value Ref Range   Troponin I 0.84 (HH) <0.03 ng/mL    Comment: CRITICAL VALUE NOTED. VALUE IS CONSISTENT WITH PREVIOUSLY REPORTED/CALLED VALUE /  [place]  Potassium     Status: None   Collection Time: 08/03/16  7:23 PM  Result Value Ref Range   Potassium 3.8 3.5 - 5.1 mmol/L  Magnesium     Status: None   Collection Time: 08/03/16  7:23 PM  Result Value Ref Range   Magnesium 1.9 1.7 - 2.4 mg/dL  Glucose, capillary     Status: None   Collection Time: 08/03/16  7:42 PM  Result Value Ref Range   Glucose-Capillary 89 65 - 99 mg/dL  Blood gas, arterial     Status: Abnormal   Collection Time: 08/03/16  9:35 PM  Result Value Ref Range   FIO2 0.60    Mode PRESSURE REGULATED VOLUME CONTROL    VT 450 mL   Peep/cpap 5.0 cm H20   pH, Arterial 7.45 7.350 - 7.450   pCO2 arterial 49 (H) 32.0 - 48.0 mmHg   pO2, Arterial 93 83.0 - 108.0 mmHg   Bicarbonate 34.1 (H) 20.0 - 28.0 mmol/L   Acid-Base Excess 8.8 (H) 0.0 - 2.0 mmol/L   O2 Saturation 97.6 %   Patient temperature 37.0    Collection site RIGHT RADIAL    Sample type ARTERIAL DRAW    Allens test (pass/fail) PASS PASS   Mechanical Rate 20   Glucose, capillary     Status: None   Collection Time: 08/04/16 12:08 AM  Result Value Ref Range   Glucose-Capillary 78 65 - 99 mg/dL  Troponin I     Status: Abnormal   Collection Time: 08/04/16  2:07 AM  Result Value Ref Range   Troponin I 1.11 (HH) <0.03 ng/mL    Comment: CRITICAL VALUE NOTED. VALUE IS CONSISTENT WITH PREVIOUSLY REPORTED/CALLED VALUE TLB   Basic metabolic panel     Status: Abnormal   Collection Time: 08/04/16  2:07 AM  Result Value Ref Range   Sodium 137 135 - 145 mmol/L   Potassium 5.5 (H) 3.5 - 5.1 mmol/L   Chloride 99 (L) 101 - 111 mmol/L   CO2 29 22 - 32 mmol/L   Glucose, Bld 77 65 - 99 mg/dL   BUN 28 (H) 6 - 20 mg/dL   Creatinine, Ser 5.08 (H) 0.61  - 1.24 mg/dL   Calcium 8.5 (L) 8.9 - 10.3 mg/dL   GFR calc non Af Amer 12 (L) >60 mL/min   GFR calc Af Amer 13 (L) >60 mL/min    Comment: (NOTE) The eGFR has been calculated using the CKD EPI equation. This calculation has not been validated in all clinical situations. eGFR's persistently <60 mL/min signify possible Chronic Kidney Disease.    Anion gap 9 5 - 15  CBC     Status: Abnormal   Collection Time: 08/04/16  2:37 AM  Result Value Ref Range   WBC 12.3 (H) 3.8 - 10.6 K/uL   RBC 3.26 (L) 4.40 - 5.90 MIL/uL   Hemoglobin 12.0 (L) 13.0 - 18.0 g/dL   HCT 34.7 (L) 40.0 - 52.0 %   MCV 106.6 (H) 80.0 - 100.0 fL   MCH 37.0 (H) 26.0 - 34.0 pg   MCHC 34.7 32.0 - 36.0 g/dL   RDW 15.8 (H) 11.5 - 14.5 %   Platelets 159 150 - 440 K/uL  Glucose, capillary     Status: None   Collection Time: 08/04/16  3:45 AM  Result Value Ref Range   Glucose-Capillary 66 65 - 99 mg/dL  Glucose, capillary     Status: None   Collection Time: 08/04/16  4:47 AM  Result Value Ref Range   Glucose-Capillary 76 65 - 99 mg/dL  Glucose, capillary     Status: None   Collection Time: 08/04/16  7:27 AM  Result Value Ref Range   Glucose-Capillary 91 65 - 99 mg/dL  Echocardiogram     Status: None   Collection Time: 08/04/16  8:15 AM  Result Value Ref Range   Weight 3,015.89 oz   Height 73 in   BP 107/62 mmHg  Glucose, capillary     Status: None   Collection Time: 08/04/16 11:14 AM  Result Value Ref Range   Glucose-Capillary 65 65 - 99 mg/dL  Glucose, capillary     Status: Abnormal   Collection Time: 08/04/16 11:50 AM  Result Value Ref Range   Glucose-Capillary 122 (H) 65 - 99 mg/dL  Glucose, capillary     Status: Abnormal   Collection Time: 08/04/16  4:22 PM  Result Value Ref Range   Glucose-Capillary 102 (H) 65 - 99 mg/dL  Glucose, capillary     Status: None   Collection Time: 08/04/16  9:18 PM  Result Value Ref Range   Glucose-Capillary 89 65 - 99 mg/dL  Troponin I     Status: Abnormal   Collection  Time: 08/05/16  5:14 AM  Result Value Ref Range   Troponin I 0.34 (HH) <0.03 ng/mL    Comment: CRITICAL VALUE NOTED. VALUE IS CONSISTENT WITH PREVIOUSLY REPORTED/CALLED VALUE TLB   Basic metabolic panel     Status: Abnormal   Collection Time: 08/05/16  5:14 AM  Result Value Ref Range   Sodium 140 135 - 145 mmol/L   Potassium 5.4 (H) 3.5 - 5.1 mmol/L   Chloride 103 101 - 111 mmol/L   CO2 26 22 - 32 mmol/L   Glucose, Bld 81 65 - 99 mg/dL   BUN 24 (H) 6 - 20 mg/dL   Creatinine, Ser 4.94 (H) 0.61 - 1.24 mg/dL   Calcium 8.5 (L) 8.9 - 10.3 mg/dL   GFR calc non Af Amer 12 (L) >60 mL/min   GFR calc Af Amer 14 (L) >60 mL/min    Comment: (NOTE) The eGFR has been calculated using the CKD EPI equation. This calculation has not been validated in all clinical situations. eGFR's persistently <60 mL/min signify possible Chronic Kidney Disease.    Anion gap 11 5 - 15  CBC     Status: Abnormal   Collection Time: 08/05/16  5:14 AM  Result Value Ref Range   WBC 12.1 (H) 3.8 - 10.6 K/uL   RBC 3.18 (L) 4.40 - 5.90 MIL/uL   Hemoglobin 11.3 (L) 13.0 - 18.0 g/dL   HCT 33.2 (L) 40.0 - 52.0 %   MCV 104.2 (H) 80.0 - 100.0 fL   MCH 35.6 (H) 26.0 - 34.0 pg   MCHC 34.2 32.0 - 36.0 g/dL   RDW 15.7 (H) 11.5 - 14.5 %   Platelets 146 (L) 150 - 440 K/uL  Glucose, capillary     Status: None   Collection Time: 08/05/16  7:21 AM  Result Value Ref Range   Glucose-Capillary 70 65 - 99 mg/dL  Glucose, capillary     Status: Abnormal   Collection Time: 08/05/16 11:20 AM  Result Value Ref Range   Glucose-Capillary 137 (H) 65 - 99 mg/dL  Glucose, capillary     Status: None   Collection Time: 08/05/16  6:06 PM  Result Value Ref Range   Glucose-Capillary 86 65 - 99 mg/dL  Glucose, capillary  Status: Abnormal   Collection Time: 08/05/16  8:50 PM  Result Value Ref Range   Glucose-Capillary 162 (H) 65 - 99 mg/dL  Basic metabolic panel     Status: Abnormal   Collection Time: 08/06/16  5:27 AM  Result Value  Ref Range   Sodium 135 135 - 145 mmol/L   Potassium 5.4 (H) 3.5 - 5.1 mmol/L    Comment: HEMOLYSIS AT THIS LEVEL MAY AFFECT RESULT   Chloride 97 (L) 101 - 111 mmol/L   CO2 26 22 - 32 mmol/L   Glucose, Bld 88 65 - 99 mg/dL   BUN 22 (H) 6 - 20 mg/dL   Creatinine, Ser 5.07 (H) 0.61 - 1.24 mg/dL   Calcium 9.1 8.9 - 10.3 mg/dL   GFR calc non Af Amer 12 (L) >60 mL/min   GFR calc Af Amer 13 (L) >60 mL/min    Comment: (NOTE) The eGFR has been calculated using the CKD EPI equation. This calculation has not been validated in all clinical situations. eGFR's persistently <60 mL/min signify possible Chronic Kidney Disease.    Anion gap 12 5 - 15  Glucose, capillary     Status: None   Collection Time: 08/06/16  7:42 AM  Result Value Ref Range   Glucose-Capillary 89 65 - 99 mg/dL   Comment 1 Notify RN    Comment 2 Document in Chart   Glucose, capillary     Status: Abnormal   Collection Time: 08/06/16 11:22 AM  Result Value Ref Range   Glucose-Capillary 100 (H) 65 - 99 mg/dL  Glucose, capillary     Status: Abnormal   Collection Time: 08/06/16  9:05 PM  Result Value Ref Range   Glucose-Capillary 120 (H) 65 - 99 mg/dL  Basic metabolic panel     Status: Abnormal   Collection Time: 08/07/16  3:23 AM  Result Value Ref Range   Sodium 136 135 - 145 mmol/L   Potassium 4.7 3.5 - 5.1 mmol/L    Comment: HEMOLYSIS AT THIS LEVEL MAY AFFECT RESULT   Chloride 100 (L) 101 - 111 mmol/L   CO2 28 22 - 32 mmol/L   Glucose, Bld 102 (H) 65 - 99 mg/dL   BUN 32 (H) 6 - 20 mg/dL   Creatinine, Ser 6.75 (H) 0.61 - 1.24 mg/dL   Calcium 8.5 (L) 8.9 - 10.3 mg/dL   GFR calc non Af Amer 8 (L) >60 mL/min   GFR calc Af Amer 10 (L) >60 mL/min    Comment: (NOTE) The eGFR has been calculated using the CKD EPI equation. This calculation has not been validated in all clinical situations. eGFR's persistently <60 mL/min signify possible Chronic Kidney Disease.    Anion gap 8 5 - 15  Glucose, capillary     Status: None     Collection Time: 08/07/16  8:09 AM  Result Value Ref Range   Glucose-Capillary 96 65 - 99 mg/dL   Comment 1 Notify RN    Comment 2 Document in Chart   Hepatitis B surface antigen     Status: None   Collection Time: 08/07/16  9:31 AM  Result Value Ref Range   Hepatitis B Surface Ag Negative Negative    Comment: (NOTE) Performed At: [facility] [address] [place], [place] [id number] Lindon Romp MD [id number]   Glucose, capillary     Status: None   Collection Time: 08/07/16  5:19 PM  Result Value Ref Range   Glucose-Capillary 90 65 - 99 mg/dL  Glucose, capillary     Status: Abnormal   Collection Time: 08/07/16  8:51 PM  Result Value Ref Range   Glucose-Capillary 107 (H) 65 - 99 mg/dL   Comment 1 Notify RN    Comment 2 Document in Chart   Protime-INR     Status: None   Collection Time: 08/07/16 10:05 PM  Result Value Ref Range   Prothrombin Time 12.5 11.4 - 15.2 seconds   INR 0.93   APTT     Status: Abnormal   Collection Time: 08/07/16 10:05 PM  Result Value Ref Range   aPTT 37 (H) 24 - 36 seconds    Comment:        IF BASELINE aPTT IS ELEVATED, SUGGEST PATIENT RISK ASSESSMENT BE USED TO DETERMINE APPROPRIATE ANTICOAGULANT THERAPY.   Basic metabolic panel     Status: Abnormal   Collection Time: 08/08/16  6:06 AM  Result Value Ref Range   Sodium 139 135 - 145 mmol/L   Potassium 3.9 3.5 - 5.1 mmol/L   Chloride 99 (L) 101 - 111 mmol/L   CO2 30 22 - 32 mmol/L   Glucose, Bld 81 65 - 99 mg/dL   BUN 25 (H) 6 - 20 mg/dL   Creatinine, Ser 5.44 (H) 0.61 - 1.24 mg/dL   Calcium 8.7 (L) 8.9 - 10.3 mg/dL   GFR calc non Af Amer 11 (L) >60 mL/min   GFR calc Af Amer 12 (L) >60 mL/min    Comment: (NOTE) The eGFR has been calculated using the CKD EPI equation. This calculation has not been validated in all clinical situations. eGFR's persistently <60 mL/min signify possible Chronic Kidney Disease.    Anion gap 10 5 - 15  Heparin level (unfractionated)      Status: None   Collection Time: 08/08/16  6:06 AM  Result Value Ref Range   Heparin Unfractionated 0.63 0.30 - 0.70 IU/mL    Comment:        IF HEPARIN RESULTS ARE BELOW EXPECTED VALUES, AND PATIENT DOSAGE HAS BEEN CONFIRMED, SUGGEST FOLLOW UP TESTING OF ANTITHROMBIN III LEVELS.   Glucose, capillary     Status: None   Collection Time: 08/08/16  7:44 AM  Result Value Ref Range   Glucose-Capillary 73 65 - 99 mg/dL  Glucose, capillary     Status: Abnormal   Collection Time: 08/08/16 11:30 AM  Result Value Ref Range   Glucose-Capillary 114 (H) 65 - 99 mg/dL  Heparin level (unfractionated)     Status: None   Collection Time: 08/08/16  1:45 PM  Result Value Ref Range   Heparin Unfractionated 0.48 0.30 - 0.70 IU/mL    Comment:        IF HEPARIN RESULTS ARE BELOW EXPECTED VALUES, AND PATIENT DOSAGE HAS BEEN CONFIRMED, SUGGEST FOLLOW UP TESTING OF ANTITHROMBIN III LEVELS.   CBC     Status: Abnormal   Collection Time: 08/08/16  1:45 PM  Result Value Ref Range   WBC 7.1 3.8 - 10.6 K/uL   RBC 2.97 (L) 4.40 - 5.90 MIL/uL   Hemoglobin 10.9 (L) 13.0 - 18.0 g/dL   HCT 31.2 (L) 40.0 - 52.0 %   MCV 105.1 (H) 80.0 - 100.0 fL   MCH 36.8 (H) 26.0 - 34.0 pg   MCHC 35.0 32.0 - 36.0 g/dL   RDW 14.9 (H) 11.5 - 14.5 %   Platelets 162 150 - 440 K/uL  Glucose, capillary     Status: None   Collection Time: 08/08/16  4:32 PM  Result Value Ref Range   Glucose-Capillary 89 65 - 99 mg/dL  Glucose, capillary     Status: None   Collection Time: 08/08/16  9:43 PM  Result Value Ref Range   Glucose-Capillary 77 65 - 99 mg/dL   Comment 1 Notify RN   Heparin level (unfractionated)     Status: None   Collection Time: 08/09/16  5:12 AM  Result Value Ref Range   Heparin Unfractionated 0.46 0.30 - 0.70 IU/mL    Comment:        IF HEPARIN RESULTS ARE BELOW EXPECTED VALUES, AND PATIENT DOSAGE HAS BEEN CONFIRMED, SUGGEST FOLLOW UP TESTING OF ANTITHROMBIN III LEVELS.   Glucose, capillary     Status:  None   Collection Time: 08/09/16  7:33 AM  Result Value Ref Range   Glucose-Capillary 68 65 - 99 mg/dL  Glucose, capillary     Status: Abnormal   Collection Time: 08/09/16 11:25 AM  Result Value Ref Range   Glucose-Capillary 106 (H) 65 - 99 mg/dL  Protime-INR     Status: None   Collection Time: 08/09/16  1:25 PM  Result Value Ref Range   Prothrombin Time 12.9 11.4 - 15.2 seconds   INR 0.97   Glucose, capillary     Status: Abnormal   Collection Time: 08/09/16  5:03 PM  Result Value Ref Range   Glucose-Capillary 114 (H) 65 - 99 mg/dL  Glucose, capillary     Status: Abnormal   Collection Time: 08/09/16  9:28 PM  Result Value Ref Range   Glucose-Capillary 115 (H) 65 - 99 mg/dL   Comment 1 Notify RN    Comment 2 Document in Chart   Heparin level (unfractionated)     Status: None   Collection Time: 08/10/16  3:46 AM  Result Value Ref Range   Heparin Unfractionated 0.35 0.30 - 0.70 IU/mL    Comment:        IF HEPARIN RESULTS ARE BELOW EXPECTED VALUES, AND PATIENT DOSAGE HAS BEEN CONFIRMED, SUGGEST FOLLOW UP TESTING OF ANTITHROMBIN III LEVELS.   CBC     Status: Abnormal   Collection Time: 08/10/16  3:46 AM  Result Value Ref Range   WBC 6.8 3.8 - 10.6 K/uL   RBC 2.94 (L) 4.40 - 5.90 MIL/uL   Hemoglobin 10.9 (L) 13.0 - 18.0 g/dL   HCT 31.2 (L) 40.0 - 52.0 %   MCV 106.1 (H) 80.0 - 100.0 fL   MCH 37.0 (H) 26.0 - 34.0 pg   MCHC 34.9 32.0 - 36.0 g/dL   RDW 15.0 (H) 11.5 - 14.5 %   Platelets 172 150 - 440 K/uL  Protime-INR     Status: None   Collection Time: 08/10/16  3:46 AM  Result Value Ref Range   Prothrombin Time 12.9 11.4 - 15.2 seconds   INR 0.97   Renal function panel     Status: Abnormal   Collection Time: 08/10/16  3:46 AM  Result Value Ref Range   Sodium 138 135 - 145 mmol/L   Potassium 4.1 3.5 - 5.1 mmol/L   Chloride 99 (L) 101 - 111 mmol/L   CO2 30 22 - 32 mmol/L   Glucose, Bld 89 65 - 99 mg/dL   BUN 49 (H) 6 - 20 mg/dL   Creatinine, Ser 7.76 (H) 0.61 - 1.24  mg/dL   Calcium 8.6 (L) 8.9 - 10.3 mg/dL   Phosphorus 4.2 2.5 - 4.6 mg/dL   Albumin 2.8 (L) 3.5 - 5.0 g/dL   GFR  calc non Af Amer 7 (L) >60 mL/min   GFR calc Af Amer 8 (L) >60 mL/min    Comment: (NOTE) The eGFR has been calculated using the CKD EPI equation. This calculation has not been validated in all clinical situations. eGFR's persistently <60 mL/min signify possible Chronic Kidney Disease.    Anion gap 9 5 - 15  Glucose, capillary     Status: Abnormal   Collection Time: 08/10/16  7:39 AM  Result Value Ref Range   Glucose-Capillary 118 (H) 65 - 99 mg/dL   Comment 1 Notify RN   Glucose, capillary     Status: Abnormal   Collection Time: 08/10/16  5:00 PM  Result Value Ref Range   Glucose-Capillary 123 (H) 65 - 99 mg/dL   Comment 1 Notify RN   Glucose, capillary     Status: Abnormal   Collection Time: 08/10/16  9:27 PM  Result Value Ref Range   Glucose-Capillary 107 (H) 65 - 99 mg/dL   Comment 1 Notify RN    Comment 2 Document in Chart   Glucose, capillary     Status: None   Collection Time: 08/11/16  7:58 AM  Result Value Ref Range   Glucose-Capillary 65 65 - 99 mg/dL   Comment 1 Notify RN   Glucose, capillary     Status: Abnormal   Collection Time: 08/11/16  9:01 AM  Result Value Ref Range   Glucose-Capillary 118 (H) 65 - 99 mg/dL   Comment 1 Notify RN   Glucose, capillary     Status: Abnormal   Collection Time: 08/11/16 11:39 AM  Result Value Ref Range   Glucose-Capillary 103 (H) 65 - 99 mg/dL   Comment 1 Notify RN   Glucose, capillary     Status: Abnormal   Collection Time: 08/11/16  4:35 PM  Result Value Ref Range   Glucose-Capillary 126 (H) 65 - 99 mg/dL   Comment 1 Notify RN   Glucose, capillary     Status: Abnormal   Collection Time: 08/11/16  8:56 PM  Result Value Ref Range   Glucose-Capillary 104 (H) 65 - 99 mg/dL   Comment 1 Notify RN    Comment 2 Document in Chart   Protime-INR     Status: Abnormal   Collection Time: 08/12/16  4:04 AM  Result  Value Ref Range   Prothrombin Time 19.1 (H) 11.4 - 15.2 seconds   INR 7.16   Basic metabolic panel     Status: Abnormal   Collection Time: 08/12/16  4:04 AM  Result Value Ref Range   Sodium 134 (L) 135 - 145 mmol/L   Potassium 4.7 3.5 - 5.1 mmol/L   Chloride 96 (L) 101 - 111 mmol/L   CO2 29 22 - 32 mmol/L   Glucose, Bld 81 65 - 99 mg/dL   BUN 44 (H) 6 - 20 mg/dL   Creatinine, Ser 6.56 (H) 0.61 - 1.24 mg/dL   Calcium 9.0 8.9 - 10.3 mg/dL   GFR calc non Af Amer 9 (L) >60 mL/min   GFR calc Af Amer 10 (L) >60 mL/min    Comment: (NOTE) The eGFR has been calculated using the CKD EPI equation. This calculation has not been validated in all clinical situations. eGFR's persistently <60 mL/min signify possible Chronic Kidney Disease.    Anion gap 9 5 - 15  CBC     Status: Abnormal   Collection Time: 08/12/16  4:04 AM  Result Value Ref Range   WBC 6.1 3.8 -  10.6 K/uL   RBC 2.92 (L) 4.40 - 5.90 MIL/uL   Hemoglobin 10.8 (L) 13.0 - 18.0 g/dL   HCT 31.0 (L) 40.0 - 52.0 %   MCV 106.0 (H) 80.0 - 100.0 fL   MCH 36.9 (H) 26.0 - 34.0 pg   MCHC 34.8 32.0 - 36.0 g/dL   RDW 14.8 (H) 11.5 - 14.5 %   Platelets 198 150 - 440 K/uL  Heparin level (unfractionated)     Status: None   Collection Time: 08/12/16  4:04 AM  Result Value Ref Range   Heparin Unfractionated 0.38 0.30 - 0.70 IU/mL    Comment:        IF HEPARIN RESULTS ARE BELOW EXPECTED VALUES, AND PATIENT DOSAGE HAS BEEN CONFIRMED, SUGGEST FOLLOW UP TESTING OF ANTITHROMBIN III LEVELS.   Glucose, capillary     Status: None   Collection Time: 08/12/16  7:47 AM  Result Value Ref Range   Glucose-Capillary 81 65 - 99 mg/dL  Phosphorus     Status: None   Collection Time: 08/12/16 10:00 AM  Result Value Ref Range   Phosphorus 3.4 2.5 - 4.6 mg/dL  Glucose, capillary     Status: None   Collection Time: 08/12/16  2:17 PM  Result Value Ref Range   Glucose-Capillary 77 65 - 99 mg/dL  Glucose, capillary     Status: Abnormal   Collection  Time: 08/12/16  4:48 PM  Result Value Ref Range   Glucose-Capillary 121 (H) 65 - 99 mg/dL  Glucose, capillary     Status: None   Collection Time: 08/12/16  6:17 PM  Result Value Ref Range   Glucose-Capillary 95 65 - 99 mg/dL  Glucose, capillary     Status: Abnormal   Collection Time: 08/12/16  9:50 PM  Result Value Ref Range   Glucose-Capillary 122 (H) 65 - 99 mg/dL  Heparin level (unfractionated)     Status: Abnormal   Collection Time: 08/13/16  3:40 AM  Result Value Ref Range   Heparin Unfractionated 0.10 (L) 0.30 - 0.70 IU/mL    Comment:        IF HEPARIN RESULTS ARE BELOW EXPECTED VALUES, AND PATIENT DOSAGE HAS BEEN CONFIRMED, SUGGEST FOLLOW UP TESTING OF ANTITHROMBIN III LEVELS.   Protime-INR     Status: Abnormal   Collection Time: 08/13/16  3:40 AM  Result Value Ref Range   Prothrombin Time 21.2 (H) 11.4 - 15.2 seconds   INR 1.81   Renal function panel     Status: Abnormal   Collection Time: 08/13/16  3:40 AM  Result Value Ref Range   Sodium 133 (L) 135 - 145 mmol/L   Potassium 4.4 3.5 - 5.1 mmol/L   Chloride 95 (L) 101 - 111 mmol/L   CO2 28 22 - 32 mmol/L   Glucose, Bld 94 65 - 99 mg/dL   BUN 37 (H) 6 - 20 mg/dL   Creatinine, Ser 4.72 (H) 0.61 - 1.24 mg/dL   Calcium 9.3 8.9 - 10.3 mg/dL   Phosphorus 3.0 2.5 - 4.6 mg/dL   Albumin 3.1 (L) 3.5 - 5.0 g/dL   GFR calc non Af Amer 13 (L) >60 mL/min   GFR calc Af Amer 15 (L) >60 mL/min    Comment: (NOTE) The eGFR has been calculated using the CKD EPI equation. This calculation has not been validated in all clinical situations. eGFR's persistently <60 mL/min signify possible Chronic Kidney Disease.    Anion gap 10 5 - 15  Glucose, capillary  Status: None   Collection Time: 08/13/16  8:02 AM  Result Value Ref Range   Glucose-Capillary 71 65 - 99 mg/dL  Glucose, capillary     Status: Abnormal   Collection Time: 08/13/16 12:15 PM  Result Value Ref Range   Glucose-Capillary 314 (H) 65 - 99 mg/dL  Heparin level  (unfractionated)     Status: None   Collection Time: 08/13/16  3:39 PM  Result Value Ref Range   Heparin Unfractionated 0.62 0.30 - 0.70 IU/mL    Comment:        IF HEPARIN RESULTS ARE BELOW EXPECTED VALUES, AND PATIENT DOSAGE HAS BEEN CONFIRMED, SUGGEST FOLLOW UP TESTING OF ANTITHROMBIN III LEVELS.   Glucose, capillary     Status: Abnormal   Collection Time: 08/13/16  4:43 PM  Result Value Ref Range   Glucose-Capillary 282 (H) 65 - 99 mg/dL  Glucose, capillary     Status: Abnormal   Collection Time: 08/13/16  8:30 PM  Result Value Ref Range   Glucose-Capillary 57 (L) 65 - 99 mg/dL  Glucose, capillary     Status: Abnormal   Collection Time: 08/13/16  9:12 PM  Result Value Ref Range   Glucose-Capillary 116 (H) 65 - 99 mg/dL  Heparin level (unfractionated)     Status: None   Collection Time: 08/13/16 10:33 PM  Result Value Ref Range   Heparin Unfractionated 0.60 0.30 - 0.70 IU/mL    Comment:        IF HEPARIN RESULTS ARE BELOW EXPECTED VALUES, AND PATIENT DOSAGE HAS BEEN CONFIRMED, SUGGEST FOLLOW UP TESTING OF ANTITHROMBIN III LEVELS.   Protime-INR     Status: Abnormal   Collection Time: 08/14/16  4:09 AM  Result Value Ref Range   Prothrombin Time 20.7 (H) 11.4 - 15.2 seconds   INR 1.75   CBC     Status: Abnormal   Collection Time: 08/14/16  4:09 AM  Result Value Ref Range   WBC 6.5 3.8 - 10.6 K/uL   RBC 3.17 (L) 4.40 - 5.90 MIL/uL   Hemoglobin 11.5 (L) 13.0 - 18.0 g/dL   HCT 33.8 (L) 40.0 - 52.0 %   MCV 106.4 (H) 80.0 - 100.0 fL   MCH 36.4 (H) 26.0 - 34.0 pg   MCHC 34.2 32.0 - 36.0 g/dL   RDW 14.9 (H) 11.5 - 14.5 %   Platelets 242 150 - 440 K/uL  Glucose, capillary     Status: None   Collection Time: 08/14/16  7:41 AM  Result Value Ref Range   Glucose-Capillary 79 65 - 99 mg/dL  Phosphorus     Status: None   Collection Time: 08/14/16  9:37 AM  Result Value Ref Range   Phosphorus 4.1 2.5 - 4.6 mg/dL  Glucose, capillary     Status: None   Collection Time:  08/14/16  2:25 PM  Result Value Ref Range   Glucose-Capillary 87 65 - 99 mg/dL  Glucose, capillary     Status: Abnormal   Collection Time: 08/14/16  5:17 PM  Result Value Ref Range   Glucose-Capillary 101 (H) 65 - 99 mg/dL  Glucose, capillary     Status: None   Collection Time: 08/15/16  7:36 AM  Result Value Ref Range   Glucose-Capillary 87 65 - 99 mg/dL  Protime-INR     Status: Abnormal   Collection Time: 08/15/16  8:31 AM  Result Value Ref Range   Prothrombin Time 21.3 (H) 11.4 - 15.2 seconds   INR 1.82   Heparin  level (unfractionated)     Status: None   Collection Time: 08/15/16  8:31 AM  Result Value Ref Range   Heparin Unfractionated 0.55 0.30 - 0.70 IU/mL    Comment:        IF HEPARIN RESULTS ARE BELOW EXPECTED VALUES, AND PATIENT DOSAGE HAS BEEN CONFIRMED, SUGGEST FOLLOW UP TESTING OF ANTITHROMBIN III LEVELS.   Glucose, capillary     Status: None   Collection Time: 08/15/16 11:52 AM  Result Value Ref Range   Glucose-Capillary 89 65 - 99 mg/dL  Glucose, capillary     Status: Abnormal   Collection Time: 08/15/16  4:54 PM  Result Value Ref Range   Glucose-Capillary 102 (H) 65 - 99 mg/dL  Glucose, capillary     Status: Abnormal   Collection Time: 08/15/16 10:06 PM  Result Value Ref Range   Glucose-Capillary 127 (H) 65 - 99 mg/dL  Glucose, capillary     Status: Abnormal   Collection Time: 08/16/16  7:39 AM  Result Value Ref Range   Glucose-Capillary 123 (H) 65 - 99 mg/dL  Heparin level (unfractionated)     Status: None   Collection Time: 08/16/16 11:02 AM  Result Value Ref Range   Heparin Unfractionated 0.66 0.30 - 0.70 IU/mL    Comment:        IF HEPARIN RESULTS ARE BELOW EXPECTED VALUES, AND PATIENT DOSAGE HAS BEEN CONFIRMED, SUGGEST FOLLOW UP TESTING OF ANTITHROMBIN III LEVELS.   Protime-INR     Status: Abnormal   Collection Time: 08/16/16 11:02 AM  Result Value Ref Range   Prothrombin Time 26.1 (H) 11.4 - 15.2 seconds   INR 2.34   Glucose, capillary      Status: None   Collection Time: 08/16/16 11:31 AM  Result Value Ref Range   Glucose-Capillary 95 65 - 99 mg/dL  Glucose, capillary     Status: Abnormal   Collection Time: 08/16/16  4:43 PM  Result Value Ref Range   Glucose-Capillary 116 (H) 65 - 99 mg/dL  Glucose, capillary     Status: Abnormal   Collection Time: 08/16/16  8:43 PM  Result Value Ref Range   Glucose-Capillary 103 (H) 65 - 99 mg/dL  Glucose, capillary     Status: None   Collection Time: 08/17/16 12:46 AM  Result Value Ref Range   Glucose-Capillary 92 65 - 99 mg/dL  Glucose, capillary     Status: Abnormal   Collection Time: 08/17/16  7:41 AM  Result Value Ref Range   Glucose-Capillary 113 (H) 65 - 99 mg/dL  Heparin level (unfractionated)     Status: Abnormal   Collection Time: 08/17/16  9:50 AM  Result Value Ref Range   Heparin Unfractionated 0.85 (H) 0.30 - 0.70 IU/mL    Comment:        IF HEPARIN RESULTS ARE BELOW EXPECTED VALUES, AND PATIENT DOSAGE HAS BEEN CONFIRMED, SUGGEST FOLLOW UP TESTING OF ANTITHROMBIN III LEVELS.   Protime-INR     Status: Abnormal   Collection Time: 08/17/16  9:50 AM  Result Value Ref Range   Prothrombin Time 26.7 (H) 11.4 - 15.2 seconds   INR 2.09   Basic metabolic panel     Status: Abnormal   Collection Time: 08/17/16  9:50 AM  Result Value Ref Range   Sodium 126 (L) 135 - 145 mmol/L   Potassium 4.9 3.5 - 5.1 mmol/L   Chloride 89 (L) 101 - 111 mmol/L   CO2 25 22 - 32 mmol/L   Glucose, Bld 134 (H)  65 - 99 mg/dL   BUN 52 (H) 6 - 20 mg/dL   Creatinine, Ser 7.18 (H) 0.61 - 1.24 mg/dL   Calcium 9.2 8.9 - 10.3 mg/dL   GFR calc non Af Amer 8 (L) >60 mL/min   GFR calc Af Amer 9 (L) >60 mL/min    Comment: (NOTE) The eGFR has been calculated using the CKD EPI equation. This calculation has not been validated in all clinical situations. eGFR's persistently <60 mL/min signify possible Chronic Kidney Disease.    Anion gap 12 5 - 15  CBC     Status: Abnormal   Collection Time:  08/17/16  9:50 AM  Result Value Ref Range   WBC 6.9 3.8 - 10.6 K/uL   RBC 3.02 (L) 4.40 - 5.90 MIL/uL   Hemoglobin 11.0 (L) 13.0 - 18.0 g/dL   HCT 31.5 (L) 40.0 - 52.0 %   MCV 104.3 (H) 80.0 - 100.0 fL   MCH 36.3 (H) 26.0 - 34.0 pg   MCHC 34.8 32.0 - 36.0 g/dL   RDW 15.0 (H) 11.5 - 14.5 %   Platelets 302 150 - 440 K/uL  Phosphorus     Status: None   Collection Time: 08/17/16  9:50 AM  Result Value Ref Range   Phosphorus 4.0 2.5 - 4.6 mg/dL  Renal function panel     Status: Abnormal   Collection Time: 08/17/16 10:21 AM  Result Value Ref Range   Sodium 125 (L) 135 - 145 mmol/L   Potassium 4.9 3.5 - 5.1 mmol/L   Chloride 89 (L) 101 - 111 mmol/L   CO2 25 22 - 32 mmol/L   Glucose, Bld 131 (H) 65 - 99 mg/dL   BUN 51 (H) 6 - 20 mg/dL   Creatinine, Ser 7.10 (H) 0.61 - 1.24 mg/dL   Calcium 9.2 8.9 - 10.3 mg/dL   Phosphorus 4.1 2.5 - 4.6 mg/dL   Albumin 3.3 (L) 3.5 - 5.0 g/dL   GFR calc non Af Amer 8 (L) >60 mL/min   GFR calc Af Amer 9 (L) >60 mL/min    Comment: (NOTE) The eGFR has been calculated using the CKD EPI equation. This calculation has not been validated in all clinical situations. eGFR's persistently <60 mL/min signify possible Chronic Kidney Disease.    Anion gap 11 5 - 15  Glucose, capillary     Status: Abnormal   Collection Time: 08/17/16 12:03 PM  Result Value Ref Range   Glucose-Capillary 100 (H) 65 - 99 mg/dL  Glucose, capillary     Status: Abnormal   Collection Time: 08/17/16  4:52 PM  Result Value Ref Range   Glucose-Capillary 119 (H) 65 - 99 mg/dL  Glucose, capillary     Status: None   Collection Time: 08/17/16  8:59 PM  Result Value Ref Range   Glucose-Capillary 85 65 - 99 mg/dL  Glucose, capillary     Status: None   Collection Time: 08/18/16  7:44 AM  Result Value Ref Range   Glucose-Capillary 73 65 - 99 mg/dL  Protime-INR     Status: Abnormal   Collection Time: 10/01/16 11:45 AM  Result Value Ref Range   Prothrombin Time 19.5 (H) 11.4 - 15.2  seconds   INR 1.63   Protime-INR     Status: Abnormal   Collection Time: 10/06/16  9:27 AM  Result Value Ref Range   Prothrombin Time 21.0 (H) 11.4 - 15.2 seconds   INR 1.79   NM Myocar Multi W/Spect W/Wall Motion / EF  Status: None   Collection Time: 10/06/16 12:58 PM  Result Value Ref Range   Rest HR 88 bpm   Rest BP 147/71 mmHg   Peak HR 105 bpm   Peak BP 147/71 mmHg   SSS 2    SRS 2    SDS 2    TID 1.00    LV sys vol 27 mL   LV dias vol 88 62 - 150 mL  Protime-INR     Status: Abnormal   Collection Time: 10/15/16  1:41 PM  Result Value Ref Range   Prothrombin Time 19.8 (H) 11.4 - 15.2 seconds   INR 1.66     Radiology Nm Myocar Multi W/spect W/wall Motion / Ef  Result Date: 10/07/2016  There was no ST segment deviation noted during stress.  Blood pressure demonstrated a normal response to exercise.  The study is normal.  This is a low risk study.  The left ventricular ejection fraction is hyperdynamic (>65%).       Assessment/Plan  Essential hypertension, benign blood pressure control important in reducing the progression of atherosclerotic disease. On appropriate oral medications.   Hyperkalemia Felt to be diet related. Fistula has good flow.  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 No high-grade stenosis was identified.  Continue to use the fistula and would be helpful if the access sites could be rotated more. Plan to recheck in 6 months.    Leotis Pain, MD  10/20/2016 4:18 PM    This note was created with Dragon medical transcription system.  Any errors from dictation are purely unintentional

## 2016-10-22 ENCOUNTER — Inpatient Hospital Stay: Payer: Medicaid Other

## 2016-10-22 DIAGNOSIS — Z862 Personal history of diseases of the blood and blood-forming organs and certain disorders involving the immune mechanism: Secondary | ICD-10-CM | POA: Diagnosis not present

## 2016-10-22 DIAGNOSIS — Z7901 Long term (current) use of anticoagulants: Secondary | ICD-10-CM

## 2016-10-22 LAB — PROTIME-INR
INR: 2.4
Prothrombin Time: 26.6 seconds — ABNORMAL HIGH (ref 11.4–15.2)

## 2016-10-29 ENCOUNTER — Inpatient Hospital Stay: Payer: Medicaid Other

## 2016-10-29 DIAGNOSIS — Z862 Personal history of diseases of the blood and blood-forming organs and certain disorders involving the immune mechanism: Secondary | ICD-10-CM | POA: Diagnosis not present

## 2016-10-29 DIAGNOSIS — Z7901 Long term (current) use of anticoagulants: Secondary | ICD-10-CM

## 2016-10-29 LAB — PROTIME-INR
INR: 2.2
Prothrombin Time: 24.8 seconds — ABNORMAL HIGH (ref 11.4–15.2)

## 2016-11-05 ENCOUNTER — Inpatient Hospital Stay: Payer: Medicaid Other | Attending: Oncology

## 2016-11-05 DIAGNOSIS — Z7901 Long term (current) use of anticoagulants: Secondary | ICD-10-CM

## 2016-11-05 DIAGNOSIS — D689 Coagulation defect, unspecified: Secondary | ICD-10-CM | POA: Insufficient documentation

## 2016-11-05 LAB — PROTIME-INR
INR: 2.55
Prothrombin Time: 27.9 seconds — ABNORMAL HIGH (ref 11.4–15.2)

## 2016-11-12 ENCOUNTER — Inpatient Hospital Stay: Payer: Medicaid Other

## 2016-11-12 DIAGNOSIS — D689 Coagulation defect, unspecified: Secondary | ICD-10-CM | POA: Diagnosis not present

## 2016-11-12 DIAGNOSIS — Z7901 Long term (current) use of anticoagulants: Secondary | ICD-10-CM

## 2016-11-12 LAB — PROTIME-INR
INR: 2.01
Prothrombin Time: 23.1 seconds — ABNORMAL HIGH (ref 11.4–15.2)

## 2016-11-19 ENCOUNTER — Inpatient Hospital Stay: Payer: Medicaid Other

## 2016-11-19 DIAGNOSIS — D689 Coagulation defect, unspecified: Secondary | ICD-10-CM | POA: Diagnosis not present

## 2016-11-19 DIAGNOSIS — Z7901 Long term (current) use of anticoagulants: Secondary | ICD-10-CM

## 2016-11-19 LAB — PROTIME-INR
INR: 2.23
Prothrombin Time: 25.1 seconds — ABNORMAL HIGH (ref 11.4–15.2)

## 2016-11-26 ENCOUNTER — Inpatient Hospital Stay: Payer: Medicaid Other

## 2016-11-26 DIAGNOSIS — Z7901 Long term (current) use of anticoagulants: Secondary | ICD-10-CM

## 2016-11-26 DIAGNOSIS — D689 Coagulation defect, unspecified: Secondary | ICD-10-CM | POA: Diagnosis not present

## 2016-11-26 LAB — PROTIME-INR
INR: 2.4
Prothrombin Time: 26.6 seconds — ABNORMAL HIGH (ref 11.4–15.2)

## 2016-11-30 ENCOUNTER — Encounter: Payer: Self-pay | Admitting: Internal Medicine

## 2016-12-03 ENCOUNTER — Inpatient Hospital Stay: Payer: Medicaid Other | Attending: Oncology

## 2016-12-03 DIAGNOSIS — D689 Coagulation defect, unspecified: Secondary | ICD-10-CM | POA: Diagnosis present

## 2016-12-03 DIAGNOSIS — Z7901 Long term (current) use of anticoagulants: Secondary | ICD-10-CM

## 2016-12-03 LAB — PROTIME-INR
INR: 2.22
PROTHROMBIN TIME: 25 s — AB (ref 11.4–15.2)

## 2016-12-10 ENCOUNTER — Inpatient Hospital Stay: Payer: Medicaid Other

## 2016-12-17 ENCOUNTER — Inpatient Hospital Stay: Payer: Medicaid Other

## 2016-12-23 ENCOUNTER — Other Ambulatory Visit: Payer: Self-pay | Admitting: *Deleted

## 2016-12-23 DIAGNOSIS — Z7901 Long term (current) use of anticoagulants: Secondary | ICD-10-CM

## 2017-01-26 ENCOUNTER — Telehealth: Payer: Self-pay | Admitting: *Deleted

## 2017-01-26 DIAGNOSIS — Z7901 Long term (current) use of anticoagulants: Secondary | ICD-10-CM

## 2017-01-26 MED ORDER — WARFARIN SODIUM 10 MG PO TABS: 10.0000 mg | ORAL_TABLET | Freq: Every day | ORAL | 0 refills | Status: DC

## 2017-01-26 NOTE — Telephone Encounter (Signed)
Per VO Dr Grayland Ormond, fill for 1 more month and refer to new pcp. Orders sent

## 2017-02-15 ENCOUNTER — Ambulatory Visit: Payer: Self-pay | Admitting: Internal Medicine

## 2017-02-19 ENCOUNTER — Ambulatory Visit: Payer: Self-pay | Admitting: Internal Medicine

## 2017-03-02 ENCOUNTER — Ambulatory Visit: Payer: Self-pay | Admitting: Internal Medicine

## 2017-03-30 ENCOUNTER — Telehealth: Payer: Self-pay | Admitting: *Deleted

## 2017-03-30 NOTE — Telephone Encounter (Signed)
We are no longer seeing this patient, last appointment was in May and he was told last refill on 8/28 that we will not refill for him after that refill. Appointment was made with new PCP so they could manage him and he cancelled that appointment with Dr Carolin Coy. Please advise what to do regarding refill of warfarin. Per Pharmacy Dr Grayland Ormond refilled in September

## 2017-03-30 NOTE — Telephone Encounter (Signed)
-----   Message from Elouise Munroe sent at 03/30/2017  8:13 AM EDT ----- Regarding: refill [facility] L[facility]M that he needs a refill on his Warfarin 10mg ? PCP wont refill and he needs at least a 1 time. PCP stated leave in the hands of [facility]  [phone]

## 2017-03-30 NOTE — Telephone Encounter (Addendum)
Per Dr Grayland Ormond, he will not refill this medicine again, He can go back to his PCP or find a new one who will fill this.   I spoke with Judeen Hammans, nurse with [facility] who states they did not understand the reasoning for changing PCP and told her we will not refill this medicine and that current PCP refuses to either. She states she will call and get patient appointment with Dr Kaylyn Layer

## 2017-04-01 NOTE — Telephone Encounter (Signed)
Spoke with Dian Situ at [employer] he was calling about patient medication Coumadin. Patient was seen in our clinic to get patient INR under control and once we did that we discharged patient from clinic and advised to follow up with PCP Laretta Alstrom. She has since rejected filling patient prescription after I had called her office and spoke with her about Dr. Gary Fleet recommendation. I even faxed over documentation where finnegan suggests going back to PCP since patient was stable. Dr. Olivia Mackie continues to refuse to fill medication for patient. We filled it for patient since he was needing his medication. I spoke with Judeen Hammans this morning to see if she had rescheduled patient appointment with new PCP since she canceled the previous appointments and was explained to why patient needed a new PCP. She is going to contact Dr. Gaspar Cola office to reschedule this appointment. Dr.Finnegan has aggred to fill this medication one last time since current PCP refuses patient care until patient sees his new PCP. Judeen Hammans will call me back to let me know when the appointment is and I will send in for his medication.

## 2017-04-30 ENCOUNTER — Inpatient Hospital Stay: Payer: Medicaid Other

## 2017-04-30 ENCOUNTER — Other Ambulatory Visit: Payer: Self-pay

## 2017-04-30 ENCOUNTER — Other Ambulatory Visit
Admission: RE | Admit: 2017-04-30 | Discharge: 2017-04-30 | Disposition: A | Discharge status: Home / Self Care | Payer: Medicaid Other | Source: Other Acute Inpatient Hospital | Attending: Nephrology | Admitting: Nephrology

## 2017-04-30 ENCOUNTER — Inpatient Hospital Stay
Admission: EM | Admit: 2017-04-30 | Discharge: 2017-05-07 | DRG: 377 | Disposition: A | Discharge status: Home / Self Care | Payer: Medicaid Other | Attending: Specialist | Admitting: Specialist

## 2017-04-30 DIAGNOSIS — N186 End stage renal disease: Secondary | ICD-10-CM | POA: Diagnosis present

## 2017-04-30 DIAGNOSIS — J9601 Acute respiratory failure with hypoxia: Secondary | ICD-10-CM | POA: Diagnosis present

## 2017-04-30 DIAGNOSIS — N189 Chronic kidney disease, unspecified: Secondary | ICD-10-CM | POA: Insufficient documentation

## 2017-04-30 DIAGNOSIS — Z79891 Long term (current) use of opiate analgesic: Secondary | ICD-10-CM

## 2017-04-30 DIAGNOSIS — I132 Hypertensive heart and chronic kidney disease with heart failure and with stage 5 chronic kidney disease, or end stage renal disease: Secondary | ICD-10-CM | POA: Diagnosis present

## 2017-04-30 DIAGNOSIS — K269 Duodenal ulcer, unspecified as acute or chronic, without hemorrhage or perforation: Secondary | ICD-10-CM | POA: Diagnosis not present

## 2017-04-30 DIAGNOSIS — K264 Chronic or unspecified duodenal ulcer with hemorrhage: Secondary | ICD-10-CM | POA: Diagnosis present

## 2017-04-30 DIAGNOSIS — B182 Chronic viral hepatitis C: Secondary | ICD-10-CM | POA: Diagnosis present

## 2017-04-30 DIAGNOSIS — E1122 Type 2 diabetes mellitus with diabetic chronic kidney disease: Secondary | ICD-10-CM | POA: Diagnosis present

## 2017-04-30 DIAGNOSIS — Z79899 Other long term (current) drug therapy: Secondary | ICD-10-CM | POA: Diagnosis not present

## 2017-04-30 DIAGNOSIS — Z888 Allergy status to other drugs, medicaments and biological substances status: Secondary | ICD-10-CM | POA: Diagnosis not present

## 2017-04-30 DIAGNOSIS — I361 Nonrheumatic tricuspid (valve) insufficiency: Secondary | ICD-10-CM | POA: Diagnosis not present

## 2017-04-30 DIAGNOSIS — R946 Abnormal results of thyroid function studies: Secondary | ICD-10-CM | POA: Diagnosis present

## 2017-04-30 DIAGNOSIS — K2970 Gastritis, unspecified, without bleeding: Secondary | ICD-10-CM | POA: Diagnosis not present

## 2017-04-30 DIAGNOSIS — I129 Hypertensive chronic kidney disease with stage 1 through stage 4 chronic kidney disease, or unspecified chronic kidney disease: Secondary | ICD-10-CM | POA: Diagnosis not present

## 2017-04-30 DIAGNOSIS — Z7901 Long term (current) use of anticoagulants: Secondary | ICD-10-CM | POA: Diagnosis not present

## 2017-04-30 DIAGNOSIS — R791 Abnormal coagulation profile: Secondary | ICD-10-CM | POA: Diagnosis present

## 2017-04-30 DIAGNOSIS — R29810 Facial weakness: Secondary | ICD-10-CM | POA: Diagnosis present

## 2017-04-30 DIAGNOSIS — E119 Type 2 diabetes mellitus without complications: Secondary | ICD-10-CM | POA: Diagnosis not present

## 2017-04-30 DIAGNOSIS — I5033 Acute on chronic diastolic (congestive) heart failure: Secondary | ICD-10-CM | POA: Diagnosis present

## 2017-04-30 DIAGNOSIS — E54 Ascorbic acid deficiency: Secondary | ICD-10-CM | POA: Diagnosis present

## 2017-04-30 DIAGNOSIS — Z992 Dependence on renal dialysis: Secondary | ICD-10-CM

## 2017-04-30 DIAGNOSIS — R195 Other fecal abnormalities: Secondary | ICD-10-CM | POA: Diagnosis not present

## 2017-04-30 DIAGNOSIS — F17210 Nicotine dependence, cigarettes, uncomplicated: Secondary | ICD-10-CM | POA: Diagnosis present

## 2017-04-30 DIAGNOSIS — E1151 Type 2 diabetes mellitus with diabetic peripheral angiopathy without gangrene: Secondary | ICD-10-CM | POA: Diagnosis present

## 2017-04-30 DIAGNOSIS — I701 Atherosclerosis of renal artery: Secondary | ICD-10-CM | POA: Diagnosis present

## 2017-04-30 DIAGNOSIS — D631 Anemia in chronic kidney disease: Secondary | ICD-10-CM | POA: Diagnosis present

## 2017-04-30 DIAGNOSIS — I959 Hypotension, unspecified: Secondary | ICD-10-CM | POA: Diagnosis present

## 2017-04-30 DIAGNOSIS — Z86718 Personal history of other venous thrombosis and embolism: Secondary | ICD-10-CM | POA: Diagnosis not present

## 2017-04-30 DIAGNOSIS — N2581 Secondary hyperparathyroidism of renal origin: Secondary | ICD-10-CM | POA: Diagnosis present

## 2017-04-30 DIAGNOSIS — J449 Chronic obstructive pulmonary disease, unspecified: Secondary | ICD-10-CM | POA: Diagnosis present

## 2017-04-30 DIAGNOSIS — D649 Anemia, unspecified: Secondary | ICD-10-CM | POA: Diagnosis present

## 2017-04-30 DIAGNOSIS — D539 Nutritional anemia, unspecified: Secondary | ICD-10-CM | POA: Diagnosis present

## 2017-04-30 DIAGNOSIS — F209 Schizophrenia, unspecified: Secondary | ICD-10-CM | POA: Diagnosis present

## 2017-04-30 DIAGNOSIS — E785 Hyperlipidemia, unspecified: Secondary | ICD-10-CM | POA: Diagnosis present

## 2017-04-30 DIAGNOSIS — R0602 Shortness of breath: Secondary | ICD-10-CM

## 2017-04-30 HISTORY — DX: Acute embolism and thrombosis of unspecified deep veins of unspecified lower extremity: I82.409

## 2017-04-30 LAB — PROTIME-INR
INR: 4.88 — AB
PROTHROMBIN TIME: 45.2 s — AB (ref 11.4–15.2)

## 2017-04-30 LAB — CBC WITH DIFFERENTIAL/PLATELET
BASOS ABS: 0 10*3/uL (ref 0–0.1)
Basophils Relative: 1 %
EOS ABS: 0.1 10*3/uL (ref 0–0.7)
Eosinophils Relative: 2 %
HCT: 16.2 % — ABNORMAL LOW (ref 40.0–52.0)
HEMOGLOBIN: 5.5 g/dL — AB (ref 13.0–18.0)
LYMPHS ABS: 0.6 10*3/uL — AB (ref 1.0–3.6)
Lymphocytes Relative: 14 %
MCH: 37.5 pg — AB (ref 26.0–34.0)
MCHC: 34.1 g/dL (ref 32.0–36.0)
MCV: 110 fL — ABNORMAL HIGH (ref 80.0–100.0)
Monocytes Absolute: 0.5 10*3/uL (ref 0.2–1.0)
Monocytes Relative: 11 %
NEUTROS PCT: 72 %
Neutro Abs: 3.4 10*3/uL (ref 1.4–6.5)
Platelets: 212 10*3/uL (ref 150–440)
RBC: 1.47 MIL/uL — AB (ref 4.40–5.90)
RDW: 17.6 % — ABNORMAL HIGH (ref 11.5–14.5)
WBC: 4.7 10*3/uL (ref 3.8–10.6)

## 2017-04-30 LAB — BASIC METABOLIC PANEL
Anion gap: 9 (ref 5–15)
BUN: 17 mg/dL (ref 6–20)
CHLORIDE: 97 mmol/L — AB (ref 101–111)
CO2: 31 mmol/L (ref 22–32)
CREATININE: 2.45 mg/dL — AB (ref 0.61–1.24)
Calcium: 8.4 mg/dL — ABNORMAL LOW (ref 8.9–10.3)
GFR calc non Af Amer: 28 mL/min — ABNORMAL LOW (ref >60)
GFR, EST AFRICAN AMERICAN: 32 mL/min — AB (ref >60)
Glucose, Bld: 103 mg/dL — ABNORMAL HIGH (ref 65–99)
POTASSIUM: 3.1 mmol/L — AB (ref 3.5–5.1)
SODIUM: 137 mmol/L (ref 135–145)

## 2017-04-30 LAB — PREPARE RBC (CROSSMATCH)

## 2017-04-30 LAB — POTASSIUM: Potassium: 4 mmol/L (ref 3.5–5.1)

## 2017-04-30 LAB — HEMOGLOBIN: Hemoglobin: 5.6 g/dL — ABNORMAL LOW (ref 13.0–18.0)

## 2017-04-30 MED ORDER — FLUTICASONE FUROATE-VILANTEROL 100-25 MCG/INH IN AEPB
1.0000 | INHALATION_SPRAY | Freq: Every day | RESPIRATORY_TRACT | Status: DC
  Administered 2017-05-01 – 2017-05-07 (×7): 1 via RESPIRATORY_TRACT
  Filled 2017-04-30: qty 28

## 2017-04-30 MED ORDER — RENA-VITE PO TABS
1.0000 | ORAL_TABLET | Freq: Every day | ORAL | Status: DC
  Administered 2017-05-01 – 2017-05-06 (×7): 1 via ORAL
  Filled 2017-04-30 (×7): qty 1

## 2017-04-30 MED ORDER — TRIHEXYPHENIDYL HCL 5 MG PO TABS
5.0000 mg | ORAL_TABLET | Freq: Three times a day (TID) | ORAL | Status: DC
  Administered 2017-05-01 – 2017-05-07 (×16): 5 mg via ORAL
  Filled 2017-04-30 (×21): qty 1

## 2017-04-30 MED ORDER — CARBAMAZEPINE 200 MG PO TABS
500.0000 mg | ORAL_TABLET | Freq: Two times a day (BID) | ORAL | Status: AC
  Administered 2017-05-01 – 2017-05-03 (×7): 500 mg via ORAL
  Filled 2017-04-30 (×7): qty 2.5

## 2017-04-30 MED ORDER — CYCLOBENZAPRINE HCL 10 MG PO TABS: 5.0000 mg | ORAL_TABLET | Freq: Every evening | ORAL | Status: DC | PRN

## 2017-04-30 MED ORDER — FERROUS SULFATE 325 (65 FE) MG PO TABS
325.0000 mg | ORAL_TABLET | Freq: Every day | ORAL | Status: DC
  Administered 2017-05-01 – 2017-05-07 (×7): 325 mg via ORAL
  Filled 2017-04-30 (×7): qty 1

## 2017-04-30 MED ORDER — OXYCODONE-ACETAMINOPHEN 7.5-325 MG PO TABS
1.0000 | ORAL_TABLET | ORAL | Status: DC | PRN
  Administered 2017-05-01 – 2017-05-07 (×10): 1 via ORAL
  Filled 2017-04-30 (×11): qty 1

## 2017-04-30 MED ORDER — DOCUSATE SODIUM 100 MG PO CAPS
100.0000 mg | ORAL_CAPSULE | Freq: Two times a day (BID) | ORAL | Status: DC | PRN
Start: 2017-04-30 — End: 2017-05-07
  Filled 2017-04-30: qty 1

## 2017-04-30 MED ORDER — VITAMIN D 1000 UNITS PO TABS
1000.0000 [IU] | ORAL_TABLET | Freq: Every day | ORAL | Status: DC
  Administered 2017-05-01 – 2017-05-07 (×7): 1000 [IU] via ORAL
  Filled 2017-04-30 (×7): qty 1

## 2017-04-30 MED ORDER — SODIUM CHLORIDE 0.9 % IV SOLN
Freq: Once | INTRAVENOUS | Status: AC
  Administered 2017-04-30: 21:00:00 via INTRAVENOUS

## 2017-04-30 MED ORDER — ACETAMINOPHEN 500 MG PO TABS: 500.0000 mg | ORAL_TABLET | ORAL | Status: DC | PRN

## 2017-04-30 MED ORDER — CALCIUM ACETATE (PHOS BINDER) 667 MG PO CAPS
1334.0000 mg | ORAL_CAPSULE | Freq: Three times a day (TID) | ORAL | Status: DC
  Administered 2017-05-01 – 2017-05-07 (×17): 1334 mg via ORAL
  Filled 2017-04-30 (×17): qty 2

## 2017-04-30 MED ORDER — BACLOFEN 10 MG PO TABS
10.0000 mg | ORAL_TABLET | Freq: Two times a day (BID) | ORAL | Status: DC | PRN
  Filled 2017-04-30: qty 1

## 2017-04-30 NOTE — ED Provider Notes (Addendum)
[facility] Emergency Department Provider Note ____________________________________________   First MD Initiated Contact with Patient 04/30/17 1902     (approximate)  I have reviewed the triage vital signs and the nursing notes.   HISTORY  Chief Complaint Abnormal Lab  History of present illness limited by poor historian  HPI Todd Brown is a 56 y.o. male with history of ESRD on dialysis and other past medical history as noted below who presents with anemia, with hemoglobin measured to 5.6 today, associated with generalized weakness.  Patient denies any associated abnormal bleeding, or dark stools.  Past Medical History:  Diagnosis Date  . Anemia   . Diabetes mellitus without complication ([place])   . Dialysis patient [facility])    Mon. -Wed.- Fri  . ESRD (end stage renal disease) ([place])   . Hyperlipidemia   . Malignant hypertension   . Renal artery stenosis ([place])   . Schizophrenia [facility])     Patient Active Problem List   Diagnosis Date Noted  . Anticoagulated on Coumadin 09/14/2016  . Cardiac arrest ([place]) 08/03/2016  . Sepsis ([place]) 05/26/2016  . Dialysis patient ([place]) 04/15/2016  . End stage renal disease ([place]) 02/12/2016  . Hyperkalemia 05/20/2015  . Hepatitis C 09/26/2013  . Schizophrenia ([place]) 03/23/2013  . Essential hypertension, benign 03/23/2013  . Chronic kidney disease 03/23/2013    Past Surgical History:  Procedure Laterality Date  . A/V FISTULAGRAM N/A 08/06/2016   Procedure: A/V Fistulagram;  Surgeon: Algernon Huxley, MD;  Location: [place] CV LAB;  Service: Cardiovascular;  Laterality: N/A;  . A/V SHUNT INTERVENTION N/A 08/06/2016   Procedure: A/V Shunt Intervention;  Surgeon: Algernon Huxley, MD;  Location: [place] CV LAB;  Service: Cardiovascular;  Laterality: N/A;  . AV FISTULA PLACEMENT Left 12/26/2014   Procedure: ARTERIOVENOUS (AV) FISTULA CREATION;  Surgeon: Algernon Huxley, MD;  Location: [facility] ORS;  Service: Vascular;   Laterality: Left;  . INSERTION OF DIALYSIS CATHETER Right   . PERIPHERAL VASCULAR CATHETERIZATION N/A 10/22/2014   Procedure: Dialysis/Perma Catheter Insertion;  Surgeon: Algernon Huxley, MD;  Location: [place] CV LAB;  Service: Cardiovascular;  Laterality: N/A;  . PERIPHERAL VASCULAR CATHETERIZATION N/A 02/28/2015   Procedure: Dialysis/Perma Catheter Removal;  Surgeon: Algernon Huxley, MD;  Location: [place] CV LAB;  Service: Cardiovascular;  Laterality: N/A;  . Repair fx left lower leg     yrs ago (age 79)    Prior to Admission medications   Medication Sig Start Date End Date Taking? Authorizing Provider  acetaminophen (TYLENOL) 500 MG tablet Take 500 mg by mouth every 4 (four) hours as needed for mild pain. Take 1 tablet every 4-6 hours as needed for pain.    [provider]  albuterol (PROAIR HFA) 108 (90 Base) MCG/ACT inhaler Inhale 1-2 puffs into the lungs every 6 (six) hours as needed for wheezing or shortness of breath (cough).    [provider]  amLODipine (NORVASC) 10 MG tablet Take 10 mg by mouth daily.    [provider]  b complex-vitamin c-folic acid (NEPHRO-VITE) 0.8 MG TABS tablet Take 1 tablet by mouth daily.    [provider]  baclofen (LIORESAL) 10 MG tablet Take 1 tablet (10 mg total) by mouth 2 (two) times daily as needed for muscle spasms. 05/19/15   Fritzi Mandes, MD  calcium acetate (PHOSLO) 667 MG capsule Take 2 capsules (1,334 mg total) by mouth 3 (three) times daily with meals. 05/19/15   Fritzi Mandes,  MD  carbamazepine (TEGRETOL) 200 MG tablet Take 2.5 tablets (500 mg total) by mouth 2 (two) times daily. 08/17/16   Gladstone Lighter, MD  cholecalciferol (VITAMIN D) 1000 UNITS tablet Take 1,000 Units by mouth daily.    [provider]  cloNIDine (CATAPRES) 0.3 MG tablet Take 1 tablet (0.3 mg total) by mouth 3 (three) times daily. 08/17/16   Gladstone Lighter, MD  cyclobenzaprine (FLEXERIL) 5 MG tablet Take 5 mg by mouth at  bedtime as needed for muscle spasms.    [provider]  docusate sodium (COLACE) 100 MG capsule Take 1 capsule (100 mg total) by mouth daily. 08/18/16   Gladstone Lighter, MD  ferrous sulfate 325 (65 FE) MG tablet Take 1 tablet (325 mg total) by mouth daily. 05/19/15   Fritzi Mandes, MD  fluPHENAZine (PROLIXIN) 2.5 MG tablet Take 2.5 mg by mouth daily.     [provider]  fluticasone furoate-vilanterol (BREO ELLIPTA) 100-25 MCG/INH AEPB Inhale 1 puff into the lungs daily.    [provider]  hydrALAZINE (APRESOLINE) 100 MG tablet Take 1 tablet (100 mg total) by mouth every 6 (six) hours. 08/17/16   Gladstone Lighter, MD  lidocaine (LIDODERM) 5 % Place 1 patch onto the skin daily. Remove & Discard patch within 12 hours or as directed by MD 08/17/16   Gladstone Lighter, MD  minoxidil (LONITEN) 10 MG tablet Take 1 tablet (10 mg total) by mouth daily. 08/18/16   Gladstone Lighter, MD  oxyCODONE-acetaminophen (PERCOCET) 7.5-325 MG tablet Take 1 tablet by mouth every 4 (four) hours as needed for moderate pain or severe pain. 08/17/16   Gladstone Lighter, MD  trihexyphenidyl (ARTANE) 5 MG tablet Take 5 mg by mouth 3 (three) times daily with meals.    [provider]  warfarin (COUMADIN) 10 MG tablet Take 1 tablet (10 mg total) by mouth daily. 01/26/17   Lloyd Huger, MD    Allergies Thorazine [chlorpromazine]  Family History  Problem Relation Age of Onset  . Diabetes Neg Hx     Social History Social History   Tobacco Use  . Smoking status: Current Every Day Smoker    Packs/day: 0.50    Types: Cigarettes  . Smokeless tobacco: Never Used  . Tobacco comment: 10 cigars a day  Substance Use Topics  . Alcohol use: No  . Drug use: No    Review of Systems Level V caveat: Review of systems limited due to poor historian  Constitutional: No fever.  Positive for generalized weakness Cardiovascular: Denies chest pain. Respiratory: Positive for shortness of  breath. Gastrointestinal: No bloody or dark stools. Genitourinary: Negative for dysuria or hematuria.  Musculoskeletal: Negative for back pain. Skin: Negative for rash. Neurological: Negative for headache.    ____________________________________________   PHYSICAL EXAM:  VITAL SIGNS: ED Triage Vitals  Enc Vitals Group     BP 04/30/17 1830 112/60     Pulse Rate 04/30/17 1830 87     Resp 04/30/17 1831 (!) 22     Temp 04/30/17 1831 97.8 F (36.6 C)     Temp Source 04/30/17 1831 Oral     SpO2 04/30/17 1830 95 %     Weight 04/30/17 1832 189 lb (85.7 kg)     Height 04/30/17 1832 6' (1.829 m)     Head Circumference --      [place] Flow --      Pain Score --      Pain Loc --      Pain Edu? --  Excl. in [place]? --     Constitutional: Alert and oriented x3.  Chronically ill appearing.  Eyes: Conjunctivae are pale.   Head: Atraumatic. Nose: No congestion/rhinnorhea. Mouth/Throat: Mucous membranes are somewhat dry.   Neck: Normal range of motion.  Cardiovascular: Normal rate, regular rhythm. Grossly normal heart sounds.  Good peripheral circulation. Respiratory: Normal respiratory effort.  No retractions. Lungs CTAB. Gastrointestinal: Soft and nontender. No distention.  Genitourinary: No flank tenderness. Musculoskeletal: Bilat 1+ lower extremity edema.  Extremities warm and well perfused.  Neurologic:  Normal speech and language. Motor intact in all extremities.  No gross focal neurologic deficits are appreciated.  Skin:  Skin is warm and dry. No rash noted. Psychiatric: Mood and affect are normal. Speech and behavior are normal.  ____________________________________________   LABS (all labs ordered are listed, but only abnormal results are displayed)  Labs Reviewed  BASIC METABOLIC PANEL - Abnormal; Notable for the following components:      Result Value   Potassium 3.1 (*)    Chloride 97 (*)    Glucose, Bld 103 (*)    Creatinine, Ser 2.45 (*)    Calcium 8.4 (*)    GFR  calc non Af Amer 28 (*)    GFR calc Af Amer 32 (*)    All other components within normal limits  CBC WITH DIFFERENTIAL/PLATELET - Abnormal; Notable for the following components:   RBC 1.47 (*)    Hemoglobin 5.5 (*)    HCT 16.2 (*)    MCV 110.0 (*)    MCH 37.5 (*)    RDW 17.6 (*)    Lymphs Abs 0.6 (*)    All other components within normal limits  PROTIME-INR  TYPE AND SCREEN   ____________________________________________  EKG  ED ECG REPORT I, Arta Silence, the attending physician, personally viewed and interpreted this ECG.  Date: 04/30/2017 EKG Time: 1957 Rate: 80 Rhythm: normal sinus rhythm QRS Axis: normal Intervals: normal ST/T Wave abnormalities: Nonspecific Narrative Interpretation: Poor baseline; no evidence of acute ischemia  ____________________________________________  RADIOLOGY    ____________________________________________   PROCEDURES  Procedure(s) performed: No    Critical Care performed: No ____________________________________________   INITIAL IMPRESSION / ASSESSMENT AND PLAN / ED COURSE  Pertinent labs & imaging results that were available during my care of the patient were reviewed by me and considered in my medical decision making (see chart for details).  56 year old male with history of ESRD on dialysis and other past medical history as noted above presents after he was referred to the ED due to low hemoglobin found on labs today.  Patient states that he has generalized weakness, but denies any other focal symptoms.  He denies any dark, tarry or bloody stools.  He still makes some urine and denies hematuria.  No other abnormal bleeding.  On exam, vital signs are normal except for some borderline hypoxia when off O2.  Patient is relatively comfortable appearing.  There is chronic appearing bilateral lower extremity edema, but no other acute exam findings.  Review of past medical records in Epic reveals that patient's last  hemoglobin 2 months ago was 11, so this is an acute drop for the patient.  Patient declines to have me perform a DRE to obtain stool sample for guaiac.  Overall I suspect that the anemia is most likely chronic and related to the kidney dysfunction, but given the acute drop and symptoms of weakness patient will likely need transfusion if hgb is in fact in the 5s.  We  will obtain coags and type and screen, and likely admit for transfusion.    ----------------------------------------- 8:18 PM on 04/30/2017 -----------------------------------------  Labs here confirmed the low hemoglobin.  I signed the patient out to the hospitalist for admission.  ____________________________________________   FINAL CLINICAL IMPRESSION(S) / ED DIAGNOSES  Final diagnoses:  Symptomatic anemia      NEW MEDICATIONS STARTED DURING THIS VISIT:  This SmartLink is deprecated. Use AVSMEDLIST instead to display the medication list for a patient.   Note:  This document was prepared using Dragon voice recognition software and may include unintentional dictation errors.    Arta Silence, MD 04/30/17 [place]    Arta Silence, MD 04/30/17 2031

## 2017-04-30 NOTE — ED Notes (Signed)
Consent for blood signed by patient and it is in the patient's chart.

## 2017-04-30 NOTE — H&P (Signed)
[facility] [place] NAME: Todd Brown    MR#:  [id number]  DATE OF BIRTH:  1960/10/12  DATE OF ADMISSION:  04/30/2017  PRIMARY CARE PHYSICIAN: McLean-Scocuzza, Nino Glow, MD   REQUESTING/REFERRING PHYSICIAN: Saidecki  CHIEF COMPLAINT:   Chief Complaint  Patient presents with  . Abnormal Lab    HISTORY OF PRESENT ILLNESS: Todd Brown  is a 56 y.o. male with a known history of DM, ESRD on HD, HLD, malignant Htn, Renal artery stenosis, peripheral arterial disease. He had his HD as regular, but today sent from dialysis center. He is not a very good historian. Found to have low Hb. He denies any bleeding history. He appears some confused and have some speech alteration. I asked him, but did not give clear answers. I tried calling the number in chart- no reply on that number, looks wrong number.  PAST MEDICAL HISTORY:   Past Medical History:  Diagnosis Date  . Anemia   . Diabetes mellitus without complication ([place])   . Dialysis patient [facility])    Mon. -Wed.- Fri  . ESRD (end stage renal disease) ([place])   . Hyperlipidemia   . Malignant hypertension   . Renal artery stenosis ([place])   . Schizophrenia ([place])     PAST SURGICAL HISTORY:  Past Surgical History:  Procedure Laterality Date  . A/V FISTULAGRAM N/A 08/06/2016   Procedure: A/V Fistulagram;  Surgeon: Algernon Huxley, MD;  Location: [place] CV LAB;  Service: Cardiovascular;  Laterality: N/A;  . A/V SHUNT INTERVENTION N/A 08/06/2016   Procedure: A/V Shunt Intervention;  Surgeon: Algernon Huxley, MD;  Location: [place] CV LAB;  Service: Cardiovascular;  Laterality: N/A;  . AV FISTULA PLACEMENT Left 12/26/2014   Procedure: ARTERIOVENOUS (AV) FISTULA CREATION;  Surgeon: Algernon Huxley, MD;  Location: [facility] ORS;  Service: Vascular;  Laterality: Left;  . INSERTION OF DIALYSIS CATHETER Right   . PERIPHERAL VASCULAR CATHETERIZATION N/A 10/22/2014   Procedure: Dialysis/Perma Catheter Insertion;   Surgeon: Algernon Huxley, MD;  Location: [place] CV LAB;  Service: Cardiovascular;  Laterality: N/A;  . PERIPHERAL VASCULAR CATHETERIZATION N/A 02/28/2015   Procedure: Dialysis/Perma Catheter Removal;  Surgeon: Algernon Huxley, MD;  Location: [place] CV LAB;  Service: Cardiovascular;  Laterality: N/A;  . Repair fx left lower leg     yrs ago (age 40)    SOCIAL HISTORY:  Social History   Tobacco Use  . Smoking status: Current Every Day Smoker    Packs/day: 0.50    Types: Cigarettes  . Smokeless tobacco: Never Used  . Tobacco comment: 10 cigars a day  Substance Use Topics  . Alcohol use: No    FAMILY HISTORY:  Family History  Problem Relation Age of Onset  . Diabetes Neg Hx     DRUG ALLERGIES:  Allergies  Allergen Reactions  . Thorazine [Chlorpromazine] Other (See Comments)    Reaction:  Unknown , pt states it makes him feel real bad    REVIEW OF SYSTEMS:   CONSTITUTIONAL: No fever,positive for fatigue or weakness.  EYES: No blurred or double vision.  EARS, NOSE, AND THROAT: No tinnitus or ear pain.  RESPIRATORY: No cough, shortness of breath, wheezing or hemoptysis.  CARDIOVASCULAR: No chest pain, orthopnea, edema.  GASTROINTESTINAL: No nausea, vomiting, diarrhea or abdominal pain.  GENITOURINARY: No dysuria, hematuria.  ENDOCRINE: No polyuria, nocturia,  HEMATOLOGY: No anemia, easy bruising or bleeding SKIN: No rash or lesion. MUSCULOSKELETAL: No joint pain  or arthritis.   NEUROLOGIC: No tingling, numbness, weakness.  PSYCHIATRY: No anxiety or depression.   MEDICATIONS AT HOME:  Prior to Admission medications   Medication Sig Start Date End Date Taking? Authorizing Provider  acetaminophen (TYLENOL) 500 MG tablet Take 500 mg by mouth every 4 (four) hours as needed for mild pain. Take 1 tablet every 4-6 hours as needed for pain.    [provider]  albuterol (PROAIR HFA) 108 (90 Base) MCG/ACT inhaler Inhale 1-2 puffs into the lungs every 6 (six) hours as  needed for wheezing or shortness of breath (cough).    [provider]  amLODipine (NORVASC) 10 MG tablet Take 10 mg by mouth daily.    [provider]  b complex-vitamin c-folic acid (NEPHRO-VITE) 0.8 MG TABS tablet Take 1 tablet by mouth daily.    [provider]  baclofen (LIORESAL) 10 MG tablet Take 1 tablet (10 mg total) by mouth 2 (two) times daily as needed for muscle spasms. 05/19/15   Fritzi Mandes, MD  calcium acetate (PHOSLO) 667 MG capsule Take 2 capsules (1,334 mg total) by mouth 3 (three) times daily with meals. 05/19/15   Fritzi Mandes, MD  carbamazepine (TEGRETOL) 200 MG tablet Take 2.5 tablets (500 mg total) by mouth 2 (two) times daily. 08/17/16   Glad[place] Lighter, MD  cholecalciferol (VITAMIN D) 1000 UNITS tablet Take 1,000 Units by mouth daily.    [provider]  cloNIDine (CATAPRES) 0.3 MG tablet Take 1 tablet (0.3 mg total) by mouth 3 (three) times daily. 08/17/16   Gladstone Lighter, MD  cyclobenzaprine (FLEXERIL) 5 MG tablet Take 5 mg by mouth at bedtime as needed for muscle spasms.    [provider]  docusate sodium (COLACE) 100 MG capsule Take 1 capsule (100 mg total) by mouth daily. 08/18/16   Gladstone Lighter, MD  ferrous sulfate 325 (65 FE) MG tablet Take 1 tablet (325 mg total) by mouth daily. 05/19/15   Fritzi Mandes, MD  fluPHENAZine (PROLIXIN) 2.5 MG tablet Take 2.5 mg by mouth daily.     [provider]  fluticasone furoate-vilanterol (BREO ELLIPTA) 100-25 MCG/INH AEPB Inhale 1 puff into the lungs daily.    [provider]  hydrALAZINE (APRESOLINE) 100 MG tablet Take 1 tablet (100 mg total) by mouth every 6 (six) hours. 08/17/16   Gladstone Lighter, MD  lidocaine (LIDODERM) 5 % Place 1 patch onto the skin daily. Remove & Discard patch within 12 hours or as directed by MD 08/17/16   Gladstone Lighter, MD  minoxidil (LONITEN) 10 MG tablet Take 1 tablet (10 mg total) by mouth daily. 08/18/16   Gladstone Lighter, MD  oxyCODONE-acetaminophen (PERCOCET) 7.5-325 MG tablet Take 1 tablet by mouth every 4 (four) hours as needed for moderate pain or severe pain. 08/17/16   Gladstone Lighter, MD  trihexyphenidyl (ARTANE) 5 MG tablet Take 5 mg by mouth 3 (three) times daily with meals.    [provider]  warfarin (COUMADIN) 10 MG tablet Take 1 tablet (10 mg total) by mouth daily. 01/26/17   Lloyd Huger, MD      PHYSICAL EXAMINATION:   VITAL SIGNS: Blood pressure (!) 107/56, pulse 72, temperature 97.8 F (36.6 C), temperature source Oral, resp. rate 12, height 6' (1.829 m), weight 85.7 kg (189 lb), SpO2 100 %.  GENERAL:  57 y.o.-year-old patient lying in the bed with no acute distress.  EYES: Pupils equal, round, reactive to light and accommodation. No scleral icterus. Extraocular muscles intact. Conjunctiva  pale. HEENT: Head atraumatic, normocephalic. Oropharynx and nasopharynx clear.  NECK:  Supple, no jugular venous distention. No thyroid enlargement, no tenderness.  LUNGS: Normal breath sounds bilaterally, no wheezing, rales,rhonchi or crepitation. No use of accessory muscles of respiration.  CARDIOVASCULAR: S1, S2 normal. No murmurs, rubs, or gallops.  ABDOMEN: Soft, nontender, nondistended. Bowel sounds present. No organomegaly or mass.  EXTREMITIES: b/l chronic pedal edema, no cyanosis, or clubbing.  NEUROLOGIC: some facial weakness on right side with slurred speech. Muscle strength 4/5 in all extremities. Sensation intact. Gait not checked.  PSYCHIATRIC: The patient is alert and oriented x 2.  SKIN: No obvious rash, lesion, or ulcer.   LABORATORY PANEL:   CBC Recent Labs  Lab 04/30/17 1200 04/30/17 1843  WBC  --  4.7  HGB 5.6* 5.5*  HCT  --  16.2*  PLT  --  212  MCV  --  110.0*  MCH  --  37.5*  MCHC  --  34.1  RDW  --  17.6*  LYMPHSABS  --  0.6*  MONOABS  --  0.5  EOSABS  --  0.1  BASOSABS  --  0.0    ------------------------------------------------------------------------------------------------------------------  Chemistries  Recent Labs  Lab 04/30/17 1200 04/30/17 1843  NA  --  137  K 4.0 3.1*  CL  --  97*  CO2  --  31  GLUCOSE  --  103*  BUN  --  17  CREATININE  --  2.45*  CALCIUM  --  8.4*   ------------------------------------------------------------------------------------------------------------------ estimated creatinine clearance is 37 mL/min (A) (by C-G formula based on SCr of 2.45 mg/dL (H)). ------------------------------------------------------------------------------------------------------------------ No results for input(s): TSH, T4TOTAL, T3FREE, THYROIDAB in the last 72 hours.  Invalid input(s): FREET3   Coagulation profile Recent Labs  Lab 04/30/17 1843  INR 4.88*   ------------------------------------------------------------------------------------------------------------------- No results for input(s): DDIMER in the last 72 hours. -------------------------------------------------------------------------------------------------------------------  Cardiac Enzymes No results for input(s): CKMB, TROPONINI, MYOGLOBIN in the last 168 hours.  Invalid input(s): CK ------------------------------------------------------------------------------------------------------------------ Invalid input(s): POCBNP  ---------------------------------------------------------------------------------------------------------------  Urinalysis    Component Value Date/Time   COLORURINE YELLOW (A) 05/17/2015 0012   APPEARANCEUR HAZY (A) 05/17/2015 0012   LABSPEC 1.009 05/17/2015 0012   PHURINE 7.0 05/17/2015 0012   GLUCOSEU NEGATIVE 05/17/2015 0012   HGBUR 1+ (A) 05/17/2015 0012   BILIRUBINUR NEGATIVE 05/17/2015 0012   KETONESUR NEGATIVE 05/17/2015 0012   PROTEINUR 30 (A) 05/17/2015 0012   NITRITE NEGATIVE 05/17/2015 0012   LEUKOCYTESUR 3+ (A) 05/17/2015 0012      RADIOLOGY: No results found.  EKG: Orders placed or performed during the hospital encounter of 04/30/17  . ED EKG  . ED EKG    IMPRESSION AND PLAN:  * Symptomatic anemia   Blood transfusion 2 untis   GI consult    Pt is not a good historian.    He is on Coumadin - likely due to DVT in upper extremity.    INR is very high, no active bleed noted, though - he denies to check guiac to ER physician.    Watch for any stool, sent for guiac.   No Anticoagulants now.  * ESRD on HD   Nephro consult  * Confusion and some facial weakness with slurred speech   Pt is not a good historian, and no good contact number.    I will do CT head for now, and may do MRI or neuro consult, if any further concerns by tomorrow.   Check UA, if he have any urine  and check Xray chest.  * Htn   Hold meds as running Normal - low BP>  * Smoking    Counseled to quit for 4 min.  All the records are reviewed and case discussed with ED provider. Management plans discussed with the patient, family and they are in agreement.  CODE STATUS: Full. Code Status History    Date Active Date Inactive Code Status Order ID Comments User Context   08/03/2016 14:26 08/18/2016 15:47 Full Code [id number]  Wilhelmina Mcardle, MD ED   05/26/2016 17:41 05/31/2016 17:48 Full Code [id number]  Max Sane, MD Inpatient   05/16/2015 23:43 05/20/2015 18:52 Full Code [id number]  Lytle Butte, MD ED   10/20/2014 21:01 10/31/2014 20:02 Full Code [id number]  Hower, Aaron Mose, MD Inpatient       TOTAL TIME TAKING CARE OF THIS PATIENT: 50 minutes.    Vaughan Basta M.D on 04/30/2017   Between 7am to 6pm - Pager - [phone]  After 6pm go to [email or web address] - password [email or web address] [facility]  Office  [phone]  CC: Primary care physician; McLean-Scocuzza, Nino Glow, MD   Note: This dictation was prepared with Dragon dictation along with smaller phrase technology. Any transcriptional errors that result  from this process are unintentional.

## 2017-04-30 NOTE — ED Triage Notes (Signed)
Pt called today and told his hemoglobin was low. Previously 11. Pt had full dialysis treatment today. Pt denies any rectal bleeding. Alert and oriented X 4.

## 2017-05-01 ENCOUNTER — Other Ambulatory Visit: Payer: Self-pay

## 2017-05-01 DIAGNOSIS — D649 Anemia, unspecified: Secondary | ICD-10-CM

## 2017-05-01 LAB — VITAMIN B12: VITAMIN B 12: 1483 pg/mL — AB (ref 180–914)

## 2017-05-01 LAB — BASIC METABOLIC PANEL
Anion gap: 9 (ref 5–15)
BUN: 23 mg/dL — AB (ref 6–20)
CO2: 30 mmol/L (ref 22–32)
CREATININE: 3.27 mg/dL — AB (ref 0.61–1.24)
Calcium: 8.6 mg/dL — ABNORMAL LOW (ref 8.9–10.3)
Chloride: 98 mmol/L — ABNORMAL LOW (ref 101–111)
GFR calc Af Amer: 23 mL/min — ABNORMAL LOW (ref >60)
GFR calc non Af Amer: 20 mL/min — ABNORMAL LOW (ref >60)
GLUCOSE: 70 mg/dL (ref 65–99)
POTASSIUM: 3.5 mmol/L (ref 3.5–5.1)
SODIUM: 137 mmol/L (ref 135–145)

## 2017-05-01 LAB — CBC
HEMATOCRIT: 19.7 % — AB (ref 40.0–52.0)
Hemoglobin: 7.1 g/dL — ABNORMAL LOW (ref 13.0–18.0)
MCH: 36.4 pg — ABNORMAL HIGH (ref 26.0–34.0)
MCHC: 36 g/dL (ref 32.0–36.0)
MCV: 101.1 fL — ABNORMAL HIGH (ref 80.0–100.0)
PLATELETS: 189 10*3/uL (ref 150–440)
RBC: 1.95 MIL/uL — ABNORMAL LOW (ref 4.40–5.90)
RDW: 21.2 % — AB (ref 11.5–14.5)
WBC: 6.7 10*3/uL (ref 3.8–10.6)

## 2017-05-01 LAB — PROTIME-INR
INR: 3.52
Prothrombin Time: 35 seconds — ABNORMAL HIGH (ref 11.4–15.2)

## 2017-05-01 LAB — FERRITIN: Ferritin: 96 ng/mL (ref 24–336)

## 2017-05-01 LAB — IRON AND TIBC
Iron: 67 ug/dL (ref 45–182)
Saturation Ratios: 25 % (ref 17.9–39.5)
TIBC: 268 ug/dL (ref 250–450)
UIBC: 201 ug/dL

## 2017-05-01 LAB — FOLATE: Folate: 31 ng/mL

## 2017-05-01 LAB — MRSA PCR SCREENING: MRSA by PCR: POSITIVE — AB

## 2017-05-01 LAB — OCCULT BLOOD X 1 CARD TO LAB, STOOL: FECAL OCCULT BLD: POSITIVE — AB

## 2017-05-01 MED ORDER — SODIUM CHLORIDE 0.9 % IV SOLN
INTRAVENOUS | Status: DC
  Administered 2017-05-06: 08:00:00 via INTRAVENOUS

## 2017-05-01 MED ORDER — EPOETIN ALFA 10000 UNIT/ML IJ SOLN
10000.0000 [IU] | Freq: Once | INTRAMUSCULAR | Status: AC
  Administered 2017-05-01: 10000 [IU] via INTRAVENOUS

## 2017-05-01 MED ORDER — MUPIROCIN 2 % EX OINT
1.0000 "application " | TOPICAL_OINTMENT | Freq: Two times a day (BID) | CUTANEOUS | Status: AC
  Administered 2017-05-01 – 2017-05-05 (×9): 1 via NASAL
  Filled 2017-05-01: qty 22

## 2017-05-01 MED ORDER — BISACODYL 10 MG RE SUPP
10.0000 mg | Freq: Every day | RECTAL | Status: DC | PRN
  Administered 2017-05-01: 10 mg via RECTAL
  Filled 2017-05-01: qty 1

## 2017-05-01 MED ORDER — CHLORHEXIDINE GLUCONATE CLOTH 2 % EX PADS
6.0000 | MEDICATED_PAD | Freq: Every day | CUTANEOUS | Status: AC
  Administered 2017-05-01 – 2017-05-05 (×3): 6 via TOPICAL

## 2017-05-01 MED ORDER — EPOETIN ALFA 10000 UNIT/ML IJ SOLN
10000.0000 [IU] | INTRAMUSCULAR | Status: DC
  Administered 2017-05-03 – 2017-05-07 (×3): 10000 [IU] via INTRAVENOUS

## 2017-05-01 MED ORDER — MIDODRINE HCL 5 MG PO TABS
10.0000 mg | ORAL_TABLET | Freq: Once | ORAL | Status: DC
  Filled 2017-05-01: qty 2

## 2017-05-01 MED ORDER — PEG 3350-KCL-NA BICARB-NACL 420 G PO SOLR
4000.0000 mL | Freq: Once | ORAL | Status: AC
  Administered 2017-05-02: 4000 mL via ORAL
  Filled 2017-05-01: qty 4000

## 2017-05-01 NOTE — Progress Notes (Signed)
Hd end, pt alert, no c/o, stable, goal met, 1.5L removed, report to primary RN

## 2017-05-01 NOTE — Consult Note (Signed)
Todd Brown , MD [address], [place], [place], [place], [address] [id number], [place], [place], [place], [address] Phone: [phone]  Fax: [phone]  Consultation  Referring Provider: Dr Verdell Carmine Primary Care Physician:  McLean-Scocuzza, Nino Glow, MD Primary Gastroenterologist: None      Reason for Consultation:  Anemia   Date of Admission:  04/30/2017 Date of Consultation:  05/01/2017         HPI:   Todd Brown is a 56 y.o. male with a known history of diabetes mellitus, end-stage renal disease on dialysis, hyperlipidemia renal artery stenosis and peripheral arterial disease.  He was sent from dialysis center as he was found to have a low hemoglobin.  He is on Coumadin due to a DVT in the upper extremity hemoglobin was 5.6 g yesterday  MCV of 110 INR is 4.88 on admission and  this morning the INR is down to 3.5 and hemoglobin up to 7.1 g.  His BUN on admission was 17 with a creatinine of 2.45.  CT head of the abdomen performed yesterday for confusion showed no acute changes.  Chest x-ray performed yesterday showed possible trace left pleural effusion.  I do not see any prior endoscopy reports on epic.  The patient is a poor historian , denies any rectal bleeding , blood in stool, throwing up or coughing blood. No nasal bleeds or blood in urine. Denies any nsaid use.   Past Medical History:  Diagnosis Date  . Anemia   . Diabetes mellitus without complication ([place])   . Dialysis patient [facility])    Mon. -Wed.- Fri  . DVT (deep venous thrombosis) ([place])    cephalic and basolic vein thrombosis  . ESRD (end stage renal disease) ([place])   . Hyperlipidemia   . Malignant hypertension   . Renal artery stenosis ([place])   . Schizophrenia [facility])     Past Surgical History:  Procedure Laterality Date  . A/V FISTULAGRAM N/A 08/06/2016   Procedure: A/V Fistulagram;  Surgeon: Algernon Huxley, MD;  Location: [place] [facility];  Service: Cardiovascular;  Laterality: N/A;  . A/V SHUNT  INTERVENTION N/A 08/06/2016   Procedure: A/V Shunt Intervention;  Surgeon: Algernon Huxley, MD;  Location: [facility];  Service: Cardiovascular;  Laterality: N/A;  . AV FISTULA PLACEMENT Left 12/26/2014   Procedure: ARTERIOVENOUS (AV) FISTULA CREATION;  Surgeon: Algernon Huxley, MD;  Location: [facility] ORS;  Service: Vascular;  Laterality: Left;  . INSERTION OF DIALYSIS CATHETER Right   . PERIPHERAL VASCULAR CATHETERIZATION N/A 10/22/2014   Procedure: Dialysis/Perma Catheter Insertion;  Surgeon: Algernon Huxley, MD;  Location: [facility];  Service: Cardiovascular;  Laterality: N/A;  . PERIPHERAL VASCULAR CATHETERIZATION N/A 02/28/2015   Procedure: Dialysis/Perma Catheter Removal;  Surgeon: Algernon Huxley, MD;  Location: [place] [facility];  Service: Cardiovascular;  Laterality: N/A;  . Repair fx left lower leg     yrs ago (age 38)    Prior to Admission medications   Medication Sig Start Date End Date Taking? Authorizing Provider  acetaminophen (TYLENOL) 500 MG tablet Take 500 mg by mouth every 4 (four) hours as needed for mild pain. Take 1 tablet every 4-6 hours as needed for pain.    [provider]  albuterol (PROAIR HFA) 108 (90 Base) MCG/ACT inhaler Inhale 1-2 puffs into the lungs every 6 (six) hours as needed for wheezing or shortness of breath (cough).    [provider]  amLODipine (NORVASC) 10 MG  tablet Take 10 mg by mouth daily.    [provider]  b complex-vitamin c-folic acid (NEPHRO-VITE) 0.8 MG TABS tablet Take 1 tablet by mouth daily.    [provider]  baclofen (LIORESAL) 10 MG tablet Take 1 tablet (10 mg total) by mouth 2 (two) times daily as needed for muscle spasms. 05/19/15   Fritzi Mandes, MD  calcium acetate (PHOSLO) 667 MG capsule Take 2 capsules (1,334 mg total) by mouth 3 (three) times daily with meals. 05/19/15   Fritzi Mandes, MD  carbamazepine (TEGRETOL) 200 MG tablet Take 2.5 tablets (500 mg total) by mouth 2 (two) times daily. 08/17/16    Gladstone Lighter, MD  cholecalciferol (VITAMIN D) 1000 UNITS tablet Take 1,000 Units by mouth daily.    [provider]  cloNIDine (CATAPRES) 0.3 MG tablet Take 1 tablet (0.3 mg total) by mouth 3 (three) times daily. 08/17/16   Gladstone Lighter, MD  cyclobenzaprine (FLEXERIL) 5 MG tablet Take 5 mg by mouth at bedtime as needed for muscle spasms.    [provider]  docusate sodium (COLACE) 100 MG capsule Take 1 capsule (100 mg total) by mouth daily. 08/18/16   Gladstone Lighter, MD  ferrous sulfate 325 (65 FE) MG tablet Take 1 tablet (325 mg total) by mouth daily. 05/19/15   Fritzi Mandes, MD  fluPHENAZine (PROLIXIN) 2.5 MG tablet Take 2.5 mg by mouth daily.     [provider]  fluticasone furoate-vilanterol (BREO ELLIPTA) 100-25 MCG/INH AEPB Inhale 1 puff into the lungs daily.    [provider]  hydrALAZINE (APRESOLINE) 100 MG tablet Take 1 tablet (100 mg total) by mouth every 6 (six) hours. 08/17/16   Gladstone Lighter, MD  lidocaine (LIDODERM) 5 % Place 1 patch onto the skin daily. Remove & Discard patch within 12 hours or as directed by MD 08/17/16   Gladstone Lighter, MD  minoxidil (LONITEN) 10 MG tablet Take 1 tablet (10 mg total) by mouth daily. 08/18/16   Gladstone Lighter, MD  oxyCODONE-acetaminophen (PERCOCET) 7.5-325 MG tablet Take 1 tablet by mouth every 4 (four) hours as needed for moderate pain or severe pain. 08/17/16   Gladstone Lighter, MD  trihexyphenidyl (ARTANE) 5 MG tablet Take 5 mg by mouth 3 (three) times daily with meals.    [provider]  warfarin (COUMADIN) 10 MG tablet Take 1 tablet (10 mg total) by mouth daily. 01/26/17   Lloyd Huger, MD    Family History  Problem Relation Age of Onset  . Diabetes Neg Hx      Social History   Tobacco Use  . Smoking status: Current Every Day Smoker    Packs/day: 0.50    Types: Cigarettes  . Smokeless tobacco: Never Used  . Tobacco comment: 10 cigars a day  Substance Use  Topics  . Alcohol use: No  . Drug use: No    Allergies as of 04/30/2017 - Review Complete 04/30/2017  Allergen Reaction Noted  . Thorazine [chlorpromazine] Other (See Comments) 03/23/2013    Review of Systems:    All systems reviewed and negative except where noted in HPI.   Physical Exam:  Vital signs in last 24 hours: Temp:  [97.8 F (36.6 C)-98.4 F (36.9 C)] 98.3 F (36.8 C) (12/01 0549) Pulse Rate:  [59-91] 83 (12/01 0549) Resp:  [9-22] 17 (12/01 0549) BP: (101-136)/(50-75) 109/68 (12/01 0549) SpO2:  [89 %-100 %] 100 % (12/01 0549) Weight:  [189 lb (85.7 kg)-192 lb (87.1 kg)] 192 lb (87.1 kg) (11/30  2218) Last BM Date: 04/27/17 General:   Pleasant, cooperative in NAD Head:  Normocephalic and atraumatic. Eyes:   No icterus.   Conjunctiva pink. PERRLA. Ears:  Normal auditory acuity. Neck:  Supple; no masses or thyroidomegaly Lungs: Respirations even and unlabored. Lungs clear to auscultation bilaterally.   No wheezes, crackles, or rhonchi.  Heart:  Regular rate and rhythm;  Without murmur, clicks, rubs or gallops Abdomen:  Soft, nondistended, nontender. Normal bowel sounds. No appreciable masses or hepatomegaly.  No rebound or guarding.  Neurologic:  Alert and oriented x1-2;  grossly normal neurologically. Skin:  Intact without significant lesions or rashes. Cervical Nodes:  No significant cervical adenopathy. Psych:  Alert and cooperative. confused at times  LAB RESULTS: Recent Labs    04/30/17 1200 04/30/17 1843 05/01/17 0648  WBC  --  4.7 6.7  HGB 5.6* 5.5* 7.1*  HCT  --  16.2* 19.7*  PLT  --  212 189   BMET Recent Labs    04/30/17 1200 04/30/17 1843 05/01/17 0648  NA  --  137 137  K 4.0 3.1* 3.5  CL  --  97* 98*  CO2  --  31 30  GLUCOSE  --  103* 70  BUN  --  17 23*  CREATININE  --  2.45* 3.27*  CALCIUM  --  8.4* 8.6*   LFT No results for input(s): PROT, ALBUMIN, AST, ALT, ALKPHOS, BILITOT, BILIDIR, IBILI in the last 72 hours. PT/INR Recent  Labs    04/30/17 1843 05/01/17 0648  LABPROT 45.2* 35.0*  INR 4.88* 3.52    STUDIES: Dg Chest 2 View  Result Date: 05/01/2017 CLINICAL DATA:  Symptomatic anemia. EXAM: CHEST  2 VIEW COMPARISON:  Radiographs 08/13/2016 FINDINGS: Cardiomegaly is similar. Mediastinal contours are unchanged. Fine reticular opacities are chronic common unchanged from prior exam. Possible trace left pleural effusion. No focal consolidation or pneumothorax. No acute osseous abnormalities. IMPRESSION: Unchanged cardiomegaly. Unchanged fine reticular opacities that may be chronic pulmonary edema. Possible trace left pleural effusion. Electronically Signed   By: Jeb Levering M.D.   On: 05/01/2017 00:27   Ct Head Wo Contrast  Result Date: 04/30/2017 CLINICAL DATA:  56 y/o  M; anemia and generalized weakness. EXAM: CT HEAD WITHOUT CONTRAST TECHNIQUE: Contiguous axial images were obtained from the base of the skull through the vertex without intravenous contrast. COMPARISON:  None. FINDINGS: Brain: No evidence of acute infarction, hemorrhage, hydrocephalus, extra-axial collection or mass lesion/mass effect. Mild parenchymal volume loss. Vascular: Mild calcific atherosclerosis of carotid siphons. Skull: Normal. Negative for fracture or focal lesion. Sinuses/Orbits: No acute finding. Other: Bilateral external auditory canal opacification, likely cerumen. IMPRESSION: No acute intracranial abnormality identified. Mild brain parenchymal volume loss. Electronically Signed   By: Kristine Garbe M.D.   On: 04/30/2017 22:03      Impression / Plan:   Todd Brown is a 56 y.o. y/o male who is on Coumadin for a DVT.  He is on hemodialysis for end-stage renal disease and when he went to dialysis yesterday was found to have a low hemoglobin of 5.6 g with a high MCV and he was sent to the hospital to get evaluated and transfused.  He denies any overt blood loss.  Plan 1.  Check iron studies B12 folate ferritin TSH,  urine analysis check for blood loss 2.  Transfuse and monitor CBC, when INR less than 1.5 we will plan to perform EGD plus colonoscopy which will likely be on Monday.  If he is having  overt blood loss prior to Monday  please call me and we can probably do an endoscopy and colonoscopy even if the INR is higher and not at target of 1.5.   3.  There is no value in performing his stool occult in this scenario as even a  negative study will not avoid him undergoing an EGD and colonoscopy as the bleeding may be intermittent, stool occult is a colorectal cancer screening test and a negative test has no bearing on management.    Thank you for involving me in the care of this patient.      LOS: 1 day   Todd Bellows, MD  05/01/2017, 9:30 AM

## 2017-05-01 NOTE — Progress Notes (Signed)
Pre hd info 

## 2017-05-01 NOTE — Progress Notes (Signed)
Hd start, pt alert, no c/o, stable, sbp <105, MD aware, goal lowered

## 2017-05-01 NOTE — Progress Notes (Signed)
[place] at [place] NAME: Todd Brown    MR#:  [id number]  DATE OF BIRTH:  19-Oct-1960  SUBJECTIVE:   Patient here due to symptomatic anemia with a hemoglobin down to 5.5. Patient is clinically asymptomatic and is a poor historian. Status post 2 units of packed cells and hemoglobin improved to 7.1 today. Denies any other complaints presently.  REVIEW OF SYSTEMS:    Review of Systems  Constitutional: Negative for chills and fever.  HENT: Negative for congestion and tinnitus.   Eyes: Negative for blurred vision and double vision.  Respiratory: Negative for cough, shortness of breath and wheezing.   Cardiovascular: Negative for chest pain, orthopnea and PND.  Gastrointestinal: Negative for abdominal pain, diarrhea, nausea and vomiting.  Genitourinary: Negative for dysuria and hematuria.  Neurological: Negative for dizziness, sensory change and focal weakness.  All other systems reviewed and are negative.   Nutrition: Renal w/ Fluid restriction Tolerating Diet: Yes Tolerating PT: Await Eval   DRUG ALLERGIES:   Allergies  Allergen Reactions  . Thorazine [Chlorpromazine] Other (See Comments)    Reaction:  Unknown , pt states it makes him feel real bad    VITALS:  Blood pressure (!) 98/49, pulse 75, temperature 99.4 F (37.4 C), temperature source Oral, resp. rate 13, height 6\' 2"  (1.88 m), weight 87.1 kg (192 lb 0.3 oz), SpO2 100 %.  PHYSICAL EXAMINATION:   Physical Exam  GENERAL:  56 y.o.-year-old patient lying in bed in no acute distress.  EYES: Pupils equal, round, reactive to light and accommodation. No scleral icterus. Extraocular muscles intact.  HEENT: Head atraumatic, normocephalic. Oropharynx and nasopharynx clear.  NECK:  Supple, no jugular venous distention. No thyroid enlargement, no tenderness.  LUNGS: Normal breath sounds bilaterally, no wheezing, rales, rhonchi. No use of accessory muscles of respiration.   CARDIOVASCULAR: S1, S2 normal. No murmurs, rubs, or gallops.  ABDOMEN: Soft, nontender, nondistended. Bowel sounds present. No organomegaly or mass.  EXTREMITIES: No cyanosis, clubbing or edema b/l.    NEUROLOGIC: Cranial nerves II through XII are intact. No focal Motor or sensory deficits b/l.  Globally weak PSYCHIATRIC: The patient is alert and oriented x 3.  SKIN: No obvious rash, lesion, or ulcer.   Left upper Ext. AV fistula with good bruit/thrill.   LABORATORY PANEL:   CBC Recent Labs  Lab 05/01/17 0648  WBC 6.7  HGB 7.1*  HCT 19.7*  PLT 189   ------------------------------------------------------------------------------------------------------------------  Chemistries  Recent Labs  Lab 05/01/17 0648  NA 137  K 3.5  CL 98*  CO2 30  GLUCOSE 70  BUN 23*  CREATININE 3.27*  CALCIUM 8.6*   ------------------------------------------------------------------------------------------------------------------  Cardiac Enzymes No results for input(s): TROPONINI in the last 168 hours. ------------------------------------------------------------------------------------------------------------------  RADIOLOGY:  Dg Chest 2 View  Result Date: 05/01/2017 CLINICAL DATA:  Symptomatic anemia. EXAM: CHEST  2 VIEW COMPARISON:  Radiographs 08/13/2016 FINDINGS: Cardiomegaly is similar. Mediastinal contours are unchanged. Fine reticular opacities are chronic common unchanged from prior exam. Possible trace left pleural effusion. No focal consolidation or pneumothorax. No acute osseous abnormalities. IMPRESSION: Unchanged cardiomegaly. Unchanged fine reticular opacities that may be chronic pulmonary edema. Possible trace left pleural effusion. Electronically Signed   By: Jeb Levering M.D.   On: 05/01/2017 00:27   Ct Head Wo Contrast  Result Date: 04/30/2017 CLINICAL DATA:  56 y/o  M; anemia and generalized weakness. EXAM: CT HEAD WITHOUT CONTRAST TECHNIQUE: Contiguous axial images  were obtained from the base of the  skull through the vertex without intravenous contrast. COMPARISON:  None. FINDINGS: Brain: No evidence of acute infarction, hemorrhage, hydrocephalus, extra-axial collection or mass lesion/mass effect. Mild parenchymal volume loss. Vascular: Mild calcific atherosclerosis of carotid siphons. Skull: Normal. Negative for fracture or focal lesion. Sinuses/Orbits: No acute finding. Other: Bilateral external auditory canal opacification, likely cerumen. IMPRESSION: No acute intracranial abnormality identified. Mild brain parenchymal volume loss. Electronically Signed   By: Kristine Garbe M.D.   On: 04/30/2017 22:03     ASSESSMENT AND PLAN:   56 year old male with past medical history of end-stage renal disease and hemodialysis, schizophrenia, essential hypertension, previous history of DVT, diabetes, chronic anemia who presents to the hospital due to hemoglobin of 5.5.   1. Acute on chronic symptomatic anemia-patient's hemoglobin was down to 5.5 on admission. -Patient has been transfused 2 units of packed red blood cells and hemoglobin improved to 7.1. No acute bleeding noted. Patient is on Coumadin which is being held. Await Hemoccult and further gastroenterology evaluation. -Follow serial hemoglobin.  2. End-stage renal disease on hemodialysis-nephrology has been consulted, -Continue dialysis on Monday Wednesday and Friday.  3. Secondary hyperparathyroidism-continue PhosLo.  4. Previous history of DVT-Coumadin on hold given the patient's acute anemia.  5. Essential HTN - BP meds on hold as pt. Has relative hypotension.   6. COPD - no acute exacerbation.  - cont. Breo-Ellipta.    All the records are reviewed and case discussed with Care Management/Social Worker. Management plans discussed with the patient, family and they are in agreement.  CODE STATUS: Full code  DVT Prophylaxis: Coumadin   TOTAL TIME TAKING CARE OF THIS PATIENT: 30 minutes.    POSSIBLE D/C IN 1-2 DAYS, DEPENDING ON CLINICAL CONDITION.   Henreitta Leber M.D on 05/01/2017 at 1:19 PM  Between 7am to 6pm - Pager - [phone]  After 6pm go to [email or web address] - Proofreader  [facility]  Office  [phone]  CC: Primary care physician; McLean-Scocuzza, Nino Glow, MD

## 2017-05-01 NOTE — Progress Notes (Signed)
Post hd vitals 

## 2017-05-01 NOTE — Progress Notes (Signed)
Pre hd assessment  

## 2017-05-01 NOTE — Progress Notes (Signed)
Post hd assessment 

## 2017-05-01 NOTE — Progress Notes (Signed)
[facility]  ROUNDING NOTE   Subjective:   [name] admitted to [facility] on 04/30/2017 for Symptomatic anemia [D64.9]  Patient had 2 units PRBC last night for hemoglobin of 5.5 to 7.1. Patient now with shortness of breath, cough and pulmonary edema.   Outpatient hemoglobin 11/26: 7, 11/12: 9.2, 10/22: 11.   Getting Procrit 6200units with each dialysis treatment.   Patient denies any dark colored stools. He is unsure if he is taking any NSAIDs.   Objective:  Vital signs in last 24 hours:  Temp:  [97.8 F (36.6 C)-99.4 F (37.4 C)] 99.4 F (37.4 C) (12/01 1155) Pulse Rate:  [59-91] 85 (12/01 1355) Resp:  [9-22] 22 (12/01 1355) BP: (87-136)/(49-75) 96/54 (12/01 1355) SpO2:  [89 %-100 %] 100 % (12/01 1355) Weight:  [85.7 kg (189 lb)-87.1 kg (192 lb 0.3 oz)] 87.1 kg (192 lb 0.3 oz) (12/01 1155)  Weight change:  Filed Weights   04/30/17 1832 04/30/17 2218 05/01/17 1155  Weight: 85.7 kg (189 lb) 87.1 kg (192 lb) 87.1 kg (192 lb 0.3 oz)    Intake/Output: I/O last 3 completed shifts: In: 299 [I.V.:40; Blood:695] Out: -    Intake/Output this shift:  Total I/O In: 240 [P.O.:240] Out: -   Physical Exam: General: NAD,   Head: Normocephalic, atraumatic. Moist oral mucosal membranes  Eyes: Anicteric, PERRL  Neck: Supple, trachea midline  Lungs:  Bilateral basilar crackles  Heart: Regular rate and rhythm  Abdomen:  Soft, nontender,   Extremities: no peripheral edema.  Neurologic: Nonfocal, moving all four extremities  Skin: No lesions  Access: Left AVF    Basic Metabolic Panel: Recent Labs  Lab 04/30/17 1200 04/30/17 1843 05/01/17 0648  NA  --  137 137  K 4.0 3.1* 3.5  CL  --  97* 98*  CO2  --  31 30  GLUCOSE  --  103* 70  BUN  --  17 23*  CREATININE  --  2.45* 3.27*  CALCIUM  --  8.4* 8.6*    Liver Function Tests: No results for input(s): AST, ALT, ALKPHOS, BILITOT, PROT, ALBUMIN in the last 168 hours. No results for input(s): LIPASE,  AMYLASE in the last 168 hours. No results for input(s): AMMONIA in the last 168 hours.  CBC: Recent Labs  Lab 04/30/17 1200 04/30/17 1843 05/01/17 0648  WBC  --  4.7 6.7  NEUTROABS  --  3.4  --   HGB 5.6* 5.5* 7.1*  HCT  --  16.2* 19.7*  MCV  --  110.0* 101.1*  PLT  --  212 189    Cardiac Enzymes: No results for input(s): CKTOTAL, CKMB, CKMBINDEX, TROPONINI in the last 168 hours.  BNP: Invalid input(s): POCBNP  CBG: No results for input(s): GLUCAP in the last 168 hours.  Microbiology: Results for orders placed or performed during the hospital encounter of 04/30/17  MRSA PCR Screening     Status: Abnormal   Collection Time: 04/30/17 11:14 PM  Result Value Ref Range Status   MRSA by PCR POSITIVE (A) NEGATIVE Final    Comment:        The GeneXpert MRSA Assay (FDA approved for NASAL specimens only), is one component of a comprehensive MRSA colonization surveillance program. It is not intended to diagnose MRSA infection nor to guide or monitor treatment for MRSA infections. RESULT CALLED TO, READ BACK BY AND VERIFIED WITH: tamara conyers at 0118 on 05/01/17 rww     Coagulation Studies: Recent Labs  04/30/17 1843 05/01/17 0648  LABPROT 45.2* 35.0*  INR 4.88* 3.52    Urinalysis: No results for input(s): COLORURINE, LABSPEC, PHURINE, GLUCOSEU, HGBUR, BILIRUBINUR, KETONESUR, PROTEINUR, UROBILINOGEN, NITRITE, LEUKOCYTESUR in the last 72 hours.  Invalid input(s): APPERANCEUR    Imaging: Dg Chest 2 View  Result Date: 05/01/2017 CLINICAL DATA:  Symptomatic anemia. EXAM: CHEST  2 VIEW COMPARISON:  Radiographs 08/13/2016 FINDINGS: Cardiomegaly is similar. Mediastinal contours are unchanged. Fine reticular opacities are chronic common unchanged from prior exam. Possible trace left pleural effusion. No focal consolidation or pneumothorax. No acute osseous abnormalities. IMPRESSION: Unchanged cardiomegaly. Unchanged fine reticular opacities that may be chronic  pulmonary edema. Possible trace left pleural effusion. Electronically Signed   By: Jeb Levering M.D.   On: 05/01/2017 00:27   Ct Head Wo Contrast  Result Date: 04/30/2017 CLINICAL DATA:  56 y/o  M; anemia and generalized weakness. EXAM: CT HEAD WITHOUT CONTRAST TECHNIQUE: Contiguous axial images were obtained from the base of the skull through the vertex without intravenous contrast. COMPARISON:  None. FINDINGS: Brain: No evidence of acute infarction, hemorrhage, hydrocephalus, extra-axial collection or mass lesion/mass effect. Mild parenchymal volume loss. Vascular: Mild calcific atherosclerosis of carotid siphons. Skull: Normal. Negative for fracture or focal lesion. Sinuses/Orbits: No acute finding. Other: Bilateral external auditory canal opacification, likely cerumen. IMPRESSION: No acute intracranial abnormality identified. Mild brain parenchymal volume loss. Electronically Signed   By: Kristine Garbe M.D.   On: 04/30/2017 22:03     Medications:    . calcium acetate  1,334 mg Oral TID WC  . carbamazepine  500 mg Oral BID  . Chlorhexidine Gluconate Cloth  6 each Topical Q0600  . cholecalciferol  1,000 Units Oral Daily  . [START ON 05/03/2017] epoetin (EPOGEN/PROCRIT) injection  10,000 Units Intravenous Q M,W,F-HD  . ferrous sulfate  325 mg Oral Daily  . fluticasone furoate-vilanterol  1 puff Inhalation Daily  . midodrine  10 mg Oral Once in dialysis  . multivitamin  1 tablet Oral QHS  . mupirocin ointment  1 application Nasal BID  . trihexyphenidyl  5 mg Oral TID WC   acetaminophen, baclofen, bisacodyl, cyclobenzaprine, docusate sodium, oxyCODONE-acetaminophen  Assessment/ Plan:  Todd Brown is a 56 y.o. black male with end stage renal disease on hemodialysis, schizophrenia, hypertension, chronic hepatitis C, history of DVT. Admitted for symptomatic anemia  [facility] MWF [place]   1.  End-stage renal disease with pulmonary edema on examination Extra  dialysis treatment today due to pulmonary edema from extra volume from PRBC transfusion on admission - Resume MWF schedule.   2. Anemia of chronic kidney disease: macrocytic hemoglobin trending down in the last 4-6 weeks. Getting Procrit as outpatient Pending vitamin B12 and folate.  - Appreciate GI and hematology input - Check erythro blood level.  - hemoccult stool cards - EPO with HD treatment - PO iron supplements.   3. Hypertension: history of difficult to control. Now with hypotension. Home regimen of amlodipine, clonidine, hydralazine, minoxidil. However unclear what he is taking currently.  - Check echocardiogram  - Start midodrine   4. Secondary Hyperparathyroidism: outpatient labs: on 11/12 PTH, calcium and phosphorus - Continue calcium acetate with meals.    LOS: [place], Krystall Kruckenberg 12/1/20182:11 PM

## 2017-05-02 ENCOUNTER — Inpatient Hospital Stay (HOSPITAL_COMMUNITY)
Admit: 2017-05-02 | Discharge: 2017-05-02 | Disposition: A | Discharge status: Home / Self Care | Payer: Medicaid Other | Attending: Nephrology | Admitting: Nephrology

## 2017-05-02 DIAGNOSIS — D631 Anemia in chronic kidney disease: Secondary | ICD-10-CM

## 2017-05-02 DIAGNOSIS — Z992 Dependence on renal dialysis: Secondary | ICD-10-CM

## 2017-05-02 DIAGNOSIS — E119 Type 2 diabetes mellitus without complications: Secondary | ICD-10-CM

## 2017-05-02 DIAGNOSIS — I129 Hypertensive chronic kidney disease with stage 1 through stage 4 chronic kidney disease, or unspecified chronic kidney disease: Secondary | ICD-10-CM

## 2017-05-02 DIAGNOSIS — Z86718 Personal history of other venous thrombosis and embolism: Secondary | ICD-10-CM

## 2017-05-02 DIAGNOSIS — F17210 Nicotine dependence, cigarettes, uncomplicated: Secondary | ICD-10-CM

## 2017-05-02 DIAGNOSIS — Z7901 Long term (current) use of anticoagulants: Secondary | ICD-10-CM

## 2017-05-02 DIAGNOSIS — I701 Atherosclerosis of renal artery: Secondary | ICD-10-CM

## 2017-05-02 DIAGNOSIS — I361 Nonrheumatic tricuspid (valve) insufficiency: Secondary | ICD-10-CM

## 2017-05-02 DIAGNOSIS — R195 Other fecal abnormalities: Secondary | ICD-10-CM

## 2017-05-02 DIAGNOSIS — F209 Schizophrenia, unspecified: Secondary | ICD-10-CM

## 2017-05-02 DIAGNOSIS — Z79899 Other long term (current) drug therapy: Secondary | ICD-10-CM

## 2017-05-02 DIAGNOSIS — E785 Hyperlipidemia, unspecified: Secondary | ICD-10-CM

## 2017-05-02 DIAGNOSIS — N186 End stage renal disease: Secondary | ICD-10-CM

## 2017-05-02 LAB — ECHOCARDIOGRAM COMPLETE
Area-P 1/2: 3.55 cm2
E decel time: 211 msec
EERAT: 10.41
FS: 42 % (ref 28–44)
HEIGHTINCHES: 74 in
IVS/LV PW RATIO, ED: 0.93
LA ID, A-P, ES: 41 mm
LA diam end sys: 41 mm
LA diam index: 1.92 cm/m2
LA vol index: 63.2 mL/m2
LA vol: 135 mL
LAVOLA4C: 108 mL
LV E/e'average: 10.41
LV e' LATERAL: 12.3 cm/s
LVEEMED: 10.41
MV Dec: 211
MV pk A vel: 122 m/s
MV pk E vel: 128 m/s
MVPG: 7 mmHg
MVSPHT: 62 ms
P 1/2 time: 408 ms
PW: 12.2 mm — AB (ref 0.6–1.1)
RV LATERAL S' VELOCITY: 20.9 cm/s
RV TAPSE: 30 mm
TDI e' lateral: 12.3
TDI e' medial: 11.5
WEIGHTICAEL: 3072.33 [oz_av]

## 2017-05-02 LAB — DIFFERENTIAL
BASOS ABS: 0 10*3/uL (ref 0–0.1)
BASOS PCT: 0 %
Eosinophils Absolute: 0.1 10*3/uL (ref 0–0.7)
Eosinophils Relative: 1 %
LYMPHS PCT: 19 %
Lymphs Abs: 1.2 10*3/uL (ref 1.0–3.6)
MONO ABS: 0.8 10*3/uL (ref 0.2–1.0)
Monocytes Relative: 13 %
NEUTROS ABS: 4.2 10*3/uL (ref 1.4–6.5)
NEUTROS PCT: 67 %

## 2017-05-02 LAB — CBC
HEMATOCRIT: 19 % — AB (ref 40.0–52.0)
HEMOGLOBIN: 6.5 g/dL — AB (ref 13.0–18.0)
MCH: 34.8 pg — ABNORMAL HIGH (ref 26.0–34.0)
MCHC: 34.4 g/dL (ref 32.0–36.0)
MCV: 101.2 fL — ABNORMAL HIGH (ref 80.0–100.0)
PLATELETS: 170 10*3/uL (ref 150–440)
RBC: 1.88 MIL/uL — ABNORMAL LOW (ref 4.40–5.90)
RDW: 22 % — AB (ref 11.5–14.5)
WBC: 6.1 10*3/uL (ref 3.8–10.6)

## 2017-05-02 LAB — PREPARE RBC (CROSSMATCH)

## 2017-05-02 LAB — HEMOGLOBIN AND HEMATOCRIT, BLOOD
HEMATOCRIT: 22.8 % — AB (ref 40.0–52.0)
HEMOGLOBIN: 7.8 g/dL — AB (ref 13.0–18.0)

## 2017-05-02 MED ORDER — SODIUM CHLORIDE 0.9 % IV SOLN
Freq: Once | INTRAVENOUS | Status: AC
  Administered 2017-05-02: 10:00:00 via INTRAVENOUS

## 2017-05-02 MED ORDER — GUAIFENESIN-DM 100-10 MG/5ML PO SYRP
5.0000 mL | ORAL_SOLUTION | ORAL | Status: DC | PRN
  Administered 2017-05-02 – 2017-05-04 (×4): 5 mL via ORAL
  Filled 2017-05-02 (×4): qty 5

## 2017-05-02 MED ORDER — FLUTICASONE PROPIONATE 50 MCG/ACT NA SUSP
2.0000 | Freq: Every day | NASAL | Status: DC
  Administered 2017-05-02 – 2017-05-07 (×6): 2 via NASAL
  Filled 2017-05-02: qty 16

## 2017-05-02 NOTE — Progress Notes (Signed)
[facility] [place] NAME: Todd Brown    MR#:  [id number]  DATE OF BIRTH:  [birth year]  SUBJECTIVE:   His hemoglobin down to 6.5 again this morning. Patient was noted to be Hemoccult positive. Seen by gastroenterology and the plan for doing upper GI endoscopy and colonoscopy tomorrow. Patient will get 1 unit of packed red blood cells today. No other acute complaints or events overnight.  REVIEW OF SYSTEMS:    Review of Systems  Constitutional: Negative for chills and fever.  HENT: Negative for congestion and tinnitus.   Eyes: Negative for blurred vision and double vision.  Respiratory: Positive for shortness of breath. Negative for cough and wheezing.   Cardiovascular: Negative for chest pain, orthopnea and PND.  Gastrointestinal: Negative for abdominal pain, diarrhea, nausea and vomiting.  Genitourinary: Negative for dysuria and hematuria.  Neurological: Negative for dizziness, sensory change and focal weakness.  All other systems reviewed and are negative.   Nutrition: Renal w/ Fluid restriction Tolerating Diet: Yes Tolerating PT: Await Eval   DRUG ALLERGIES:   Allergies  Allergen Reactions  . Thorazine [Chlorpromazine] Other (See Comments)    Reaction:  Unknown , pt states it makes him feel real bad    VITALS:  Blood pressure 117/63, pulse 77, temperature 98.7 F (37.1 C), temperature source Oral, resp. rate 20, height 6\' 2"  (1.88 m), weight 87.1 kg (192 lb 0.3 oz), SpO2 97 %.  PHYSICAL EXAMINATION:   Physical Exam  GENERAL:  56 y.o.-year-old patient lying in bed in no acute distress.  EYES: Pupils equal, round, reactive to light and accommodation. No scleral icterus. Extraocular muscles intact.  HEENT: Head atraumatic, normocephalic. Oropharynx and nasopharynx clear.  NECK:  Supple, no jugular venous distention. No thyroid enlargement, no tenderness.  LUNGS: Normal breath sounds bilaterally, no wheezing, rales, rhonchi.  No use of accessory muscles of respiration.  CARDIOVASCULAR: S1, S2 normal. No murmurs, rubs, or gallops.  ABDOMEN: Soft, nontender, nondistended. Bowel sounds present. No organomegaly or mass.  EXTREMITIES: No cyanosis, clubbing or edema b/l.    NEUROLOGIC: Cranial nerves II through XII are intact. No focal Motor or sensory deficits b/l.  Globally weak and difficult to understand PSYCHIATRIC: The patient is alert and oriented x 2.  SKIN: No obvious rash, lesion, or ulcer.   Left upper Ext. AV fistula with good bruit/thrill.   LABORATORY PANEL:   CBC Recent Labs  Lab 05/02/17 0358  WBC 6.1  HGB 6.5*  HCT 19.0*  PLT 170   ------------------------------------------------------------------------------------------------------------------  Chemistries  Recent Labs  Lab 05/01/17 0648  NA 137  K 3.5  CL 98*  CO2 30  GLUCOSE 70  BUN 23*  CREATININE 3.27*  CALCIUM 8.6*   ------------------------------------------------------------------------------------------------------------------  Cardiac Enzymes No results for input(s): TROPONINI in the last 168 hours. ------------------------------------------------------------------------------------------------------------------  RADIOLOGY:  Dg Chest 2 View  Result Date: 05/01/2017 CLINICAL DATA:  Symptomatic anemia. EXAM: CHEST  2 VIEW COMPARISON:  Radiographs 08/13/2016 FINDINGS: Cardiomegaly is similar. Mediastinal contours are unchanged. Fine reticular opacities are chronic common unchanged from prior exam. Possible trace left pleural effusion. No focal consolidation or pneumothorax. No acute osseous abnormalities. IMPRESSION: Unchanged cardiomegaly. Unchanged fine reticular opacities that may be chronic pulmonary edema. Possible trace left pleural effusion. Electronically Signed   By: Jeb Levering M.D.   On: 05/01/2017 00:27   Ct Head Wo Contrast  Result Date: 04/30/2017 CLINICAL DATA:  56 y/o  M; anemia and generalized  weakness. EXAM: CT HEAD  WITHOUT CONTRAST TECHNIQUE: Contiguous axial images were obtained from the base of the skull through the vertex without intravenous contrast. COMPARISON:  None. FINDINGS: Brain: No evidence of acute infarction, hemorrhage, hydrocephalus, extra-axial collection or mass lesion/mass effect. Mild parenchymal volume loss. Vascular: Mild calcific atherosclerosis of carotid siphons. Skull: Normal. Negative for fracture or focal lesion. Sinuses/Orbits: No acute finding. Other: Bilateral external auditory canal opacification, likely cerumen. IMPRESSION: No acute intracranial abnormality identified. Mild brain parenchymal volume loss. Electronically Signed   By: Kristine Garbe M.D.   On: 04/30/2017 22:03     ASSESSMENT AND PLAN:   56 year old male with past medical history of end-stage renal disease and hemodialysis, schizophrenia, essential hypertension, previous history of DVT, diabetes, chronic anemia who presents to the hospital due to hemoglobin of 5.5.   1. Acute on chronic symptomatic anemia-patient's Hg. Down to 6.5 this a.m.  - We'll give 1 unit of packed red blood cells today. Patient was Hemoccult positive and seen by gastroenterology and plan proper GI endoscopy and colonoscopy tomorrow. -Continue to hold Coumadin. Continue supportive care. Follow serial hemoglobins.  2. End-stage renal disease on hemodialysis-nephrology has been consulted, -Continue dialysis on Monday Wednesday and Friday.  3. Secondary hyperparathyroidism-continue PhosLo.  4. Previous history of DVT-Coumadin on hold given the patient's acute anemia.  5. Essential HTN - BP meds on hold pt. Is still normotensive.   6. COPD - no acute exacerbation.  - cont. Breo-Ellipta.    All the records are reviewed and case discussed with Care Management/Social Worker. Management plans discussed with the patient, family and they are in agreement.  CODE STATUS: Full code  DVT Prophylaxis: Coumadin    TOTAL TIME TAKING CARE OF THIS PATIENT: 30 minutes.   POSSIBLE D/C IN 1-2 DAYS, DEPENDING ON CLINICAL CONDITION.   Henreitta Leber M.D on 05/02/2017 at 1:35 PM  Between 7am to 6pm - Pager - [phone]  After 6pm go to [email or web address] - Proofreader  [facility]  Office  [phone]  CC: Primary care physician; McLean-Scocuzza, Nino Glow, MD

## 2017-05-02 NOTE — Progress Notes (Signed)
Todd Lame, MD [facility]   [address] [place], [place] [address] Phone: [phone] Fax : [phone]   Subjective: The patient reports abdominal pain this morning but no report of nausea or vomiting. The patient is tolerating his breakfast well. The patient is scheduled to have an EGD and colonoscopy tomorrow. His hemoglobin did drop again today without any overt sign of GI bleeding.   Objective: Vital signs in last 24 hours: Vitals:   05/01/17 2238 05/02/17 0625 05/02/17 0950 05/02/17 1022  BP: (!) 98/58 116/64 120/62 117/63  Pulse: 80 84 81 77  Resp: 20 18 20 20   Temp: 99.3 F (37.4 C) 98.4 F (36.9 C) 99.3 F (37.4 C) 98.7 F (37.1 C)  TempSrc: Oral Oral Oral Oral  SpO2: 98% 95% 95% 97%  Weight:      Height:       Weight change: 3 lb 0.3 oz (1.37 kg)  Intake/Output Summary (Last 24 hours) at 05/02/2017 1107 Last data filed at 05/02/2017 1007 Gross per 24 hour  Intake 500.85 ml  Output 2125 ml  Net -1624.15 ml     Exam: Heart:: Regular rate and rhythm, S1S2 present or without murmur or extra heart sounds Lungs: normal and clear to auscultation and percussion Abdomen: soft, nontender, normal bowel sounds   Lab Results: @LABTEST2 @ Micro Results: Recent Results (from the past 240 hour(s))  MRSA PCR Screening     Status: Abnormal   Collection Time: 04/30/17 11:14 PM  Result Value Ref Range Status   MRSA by PCR POSITIVE (A) NEGATIVE Final    Comment:        The GeneXpert MRSA Assay (FDA approved for NASAL specimens only), is one component of a comprehensive MRSA colonization surveillance program. It is not intended to diagnose MRSA infection nor to guide or monitor treatment for MRSA infections. RESULT CALLED TO, READ BACK BY AND VERIFIED WITH: tamara conyers at [place] on 05/01/17 rww    Studies/Results: Dg Chest 2 View  Result Date: 05/01/2017 CLINICAL DATA:  Symptomatic anemia. EXAM: CHEST  2 VIEW COMPARISON:  Radiographs 08/13/2016  FINDINGS: Cardiomegaly is similar. Mediastinal contours are unchanged. Fine reticular opacities are chronic common unchanged from prior exam. Possible trace left pleural effusion. No focal consolidation or pneumothorax. No acute osseous abnormalities. IMPRESSION: Unchanged cardiomegaly. Unchanged fine reticular opacities that may be chronic pulmonary edema. Possible trace left pleural effusion. Electronically Signed   By: Jeb Levering M.D.   On: 05/01/2017 00:27   Ct Head Wo Contrast  Result Date: 04/30/2017 CLINICAL DATA:  56 y/o  M; anemia and generalized weakness. EXAM: CT HEAD WITHOUT CONTRAST TECHNIQUE: Contiguous axial images were obtained from the base of the skull through the vertex without intravenous contrast. COMPARISON:  None. FINDINGS: Brain: No evidence of acute infarction, hemorrhage, hydrocephalus, extra-axial collection or mass lesion/mass effect. Mild parenchymal volume loss. Vascular: Mild calcific atherosclerosis of carotid siphons. Skull: Normal. Negative for fracture or focal lesion. Sinuses/Orbits: No acute finding. Other: Bilateral external auditory canal opacification, likely cerumen. IMPRESSION: No acute intracranial abnormality identified. Mild brain parenchymal volume loss. Electronically Signed   By: Kristine Garbe M.D.   On: 04/30/2017 22:03   Medications: I have reviewed the patient's current medications. Scheduled Meds: . calcium acetate  1,334 mg Oral TID WC  . carbamazepine  500 mg Oral BID  . Chlorhexidine Gluconate Cloth  6 each Topical Q0600  . cholecalciferol  1,000 Units Oral Daily  . [START ON 05/03/2017] epoetin (EPOGEN/PROCRIT) injection  10,000 Units Intravenous  Q M,W,F-HD  . ferrous sulfate  325 mg Oral Daily  . fluticasone furoate-vilanterol  1 puff Inhalation Daily  . midodrine  10 mg Oral Once in dialysis  . multivitamin  1 tablet Oral QHS  . mupirocin ointment  1 application Nasal BID  . polyethylene glycol-electrolytes  4,000 mL Oral  Once  . trihexyphenidyl  5 mg Oral TID WC   Continuous Infusions: . sodium chloride     PRN Meds:.acetaminophen, baclofen, bisacodyl, cyclobenzaprine, docusate sodium, oxyCODONE-acetaminophen   Assessment: Principal Problem:   Symptomatic anemia    Plan: The patient has a drop and hemoglobin today and is set up for an EGD and colonoscopy for tomorrow. The patient will be started on a clear liquid diet today. There is no sign of any overt bleeding with any black stools or bloody stools today. The patient has been explained the plan and agrees with it.   LOS: 2 days   Todd Brown 05/02/2017, 11:07 AM

## 2017-05-02 NOTE — Progress Notes (Signed)
[facility]  ROUNDING NOTE   Subjective:   Hemodialysis treatment yesterday. Extra treatment. UF of 1.5 liters. Tolerated treatment well.   Patient scheduled for 1 unit PRBC transfusion today.   Patient scheduled for endoscopy tomorrow.   Objective:  Vital signs in last 24 hours:  Temp:  [98.4 F (36.9 C)-99.4 F (37.4 C)] 98.7 F (37.1 C) (12/02 1022) Pulse Rate:  [70-89] 77 (12/02 1022) Resp:  [12-22] 20 (12/02 1022) BP: (87-120)/(48-70) 117/63 (12/02 1022) SpO2:  [95 %-100 %] 97 % (12/02 1022) Weight:  [87.1 kg (192 lb 0.3 oz)] 87.1 kg (192 lb 0.3 oz) (12/01 1155)  Weight change: 1.37 kg (3 lb 0.3 oz) Filed Weights   04/30/17 1832 04/30/17 2218 05/01/17 1155  Weight: 85.7 kg (189 lb) 87.1 kg (192 lb) 87.1 kg (192 lb 0.3 oz)    Intake/Output: I/O last 3 completed shifts: In: 4315 [P.O.:720; I.V.:40; Blood:695] Out: 2125 [Urine:625; Other:1500]   Intake/Output this shift:  Total I/O In: 20.9 [I.V.:20.9] Out: -   Physical Exam: General: NAD,   Head: Normocephalic, atraumatic. Moist oral mucosal membranes  Eyes: Anicteric, PERRL  Neck: Supple, trachea midline  Lungs:  clear  Heart: Regular rate and rhythm  Abdomen:  Soft, nontender,   Extremities: no peripheral edema.  Neurologic: Nonfocal, moving all four extremities  Skin: No lesions  Access: Left AVF    Basic Metabolic Panel: Recent Labs  Lab 04/30/17 1200 04/30/17 1843 05/01/17 0648  NA  --  137 137  K 4.0 3.1* 3.5  CL  --  97* 98*  CO2  --  31 30  GLUCOSE  --  103* 70  BUN  --  17 23*  CREATININE  --  2.45* 3.27*  CALCIUM  --  8.4* 8.6*    Liver Function Tests: No results for input(s): AST, ALT, ALKPHOS, BILITOT, PROT, ALBUMIN in the last 168 hours. No results for input(s): LIPASE, AMYLASE in the last 168 hours. No results for input(s): AMMONIA in the last 168 hours.  CBC: Recent Labs  Lab 04/30/17 1200 04/30/17 1843 05/01/17 0648 05/02/17 0358  WBC  --  4.7 6.7 6.1   NEUTROABS  --  3.4  --  4.2  HGB 5.6* 5.5* 7.1* 6.5*  HCT  --  16.2* 19.7* 19.0*  MCV  --  110.0* 101.1* 101.2*  PLT  --  212 189 170    Cardiac Enzymes: No results for input(s): CKTOTAL, CKMB, CKMBINDEX, TROPONINI in the last 168 hours.  BNP: Invalid input(s): POCBNP  CBG: No results for input(s): GLUCAP in the last 168 hours.  Microbiology: Results for orders placed or performed during the hospital encounter of 04/30/17  MRSA PCR Screening     Status: Abnormal   Collection Time: 04/30/17 11:14 PM  Result Value Ref Range Status   MRSA by PCR POSITIVE (A) NEGATIVE Final    Comment:        The GeneXpert MRSA Assay (FDA approved for NASAL specimens only), is one component of a comprehensive MRSA colonization surveillance program. It is not intended to diagnose MRSA infection nor to guide or monitor treatment for MRSA infections. RESULT CALLED TO, READ BACK BY AND VERIFIED WITH: tamara conyers at 0118 on 05/01/17 rww     Coagulation Studies: Recent Labs    04/30/17 1843 05/01/17 0648  LABPROT 45.2* 35.0*  INR 4.88* 3.52    Urinalysis: No results for input(s): COLORURINE, LABSPEC, PHURINE, GLUCOSEU, HGBUR, BILIRUBINUR, KETONESUR, PROTEINUR, UROBILINOGEN, NITRITE, LEUKOCYTESUR in the last  72 hours.  Invalid input(s): APPERANCEUR    Imaging: Dg Chest 2 View  Result Date: 05/01/2017 CLINICAL DATA:  Symptomatic anemia. EXAM: CHEST  2 VIEW COMPARISON:  Radiographs 08/13/2016 FINDINGS: Cardiomegaly is similar. Mediastinal contours are unchanged. Fine reticular opacities are chronic common unchanged from prior exam. Possible trace left pleural effusion. No focal consolidation or pneumothorax. No acute osseous abnormalities. IMPRESSION: Unchanged cardiomegaly. Unchanged fine reticular opacities that may be chronic pulmonary edema. Possible trace left pleural effusion. Electronically Signed   By: Jeb Levering M.D.   On: 05/01/2017 00:27   Ct Head Wo Contrast  Result  Date: 04/30/2017 CLINICAL DATA:  56 y/o  M; anemia and generalized weakness. EXAM: CT HEAD WITHOUT CONTRAST TECHNIQUE: Contiguous axial images were obtained from the base of the skull through the vertex without intravenous contrast. COMPARISON:  None. FINDINGS: Brain: No evidence of acute infarction, hemorrhage, hydrocephalus, extra-axial collection or mass lesion/mass effect. Mild parenchymal volume loss. Vascular: Mild calcific atherosclerosis of carotid siphons. Skull: Normal. Negative for fracture or focal lesion. Sinuses/Orbits: No acute finding. Other: Bilateral external auditory canal opacification, likely cerumen. IMPRESSION: No acute intracranial abnormality identified. Mild brain parenchymal volume loss. Electronically Signed   By: Kristine Garbe M.D.   On: 04/30/2017 22:03     Medications:   . sodium chloride     . calcium acetate  1,334 mg Oral TID WC  . carbamazepine  500 mg Oral BID  . Chlorhexidine Gluconate Cloth  6 each Topical Q0600  . cholecalciferol  1,000 Units Oral Daily  . [START ON 05/03/2017] epoetin (EPOGEN/PROCRIT) injection  10,000 Units Intravenous Q M,W,F-HD  . ferrous sulfate  325 mg Oral Daily  . fluticasone furoate-vilanterol  1 puff Inhalation Daily  . midodrine  10 mg Oral Once in dialysis  . multivitamin  1 tablet Oral QHS  . mupirocin ointment  1 application Nasal BID  . polyethylene glycol-electrolytes  4,000 mL Oral Once  . trihexyphenidyl  5 mg Oral TID WC   acetaminophen, baclofen, bisacodyl, cyclobenzaprine, docusate sodium, oxyCODONE-acetaminophen  Assessment/ Plan:  [name] is a 56 y.o. black male with end stage renal disease on hemodialysis, schizophrenia, hypertension, chronic hepatitis C, history of DVT. Admitted for symptomatic anemia  [facility] MWF [place] Left AVF  1.  End-stage renal disease with extra dialysis treatment yesterday due to pulmonary edema status post 2 units PRBC transfusion on admission.  -  Adjust patient's target weight to 86kg as outpatient - Monitor volume status and respiratory status with addition transfusion today.  - Hemodialysis scheduled for tomorrow.   2. Anemia of chronic kidney disease: macrocytic. Vitamin B12 and folate levels at goal. Methylmalonic acid level pending. Pending epo level. hemoglobin trending down in the last 4-6 weeks. Getting Procrit as outpatient - Appreciate GI and hematology input. Endoscopy for tomorrow.  - Check ascorbic acid level.  - EPO with HD treatment - PO iron supplements.   3. Hypertension: history of difficult to control. Now with hypotension. Home regimen of amlodipine, clonidine, hydralazine, minoxidil. However unclear what he is taking currently.  - Pending echocardiogram  - Started midodrine   4. Secondary Hyperparathyroidism: outpatient labs: on 11/12 PTH, calcium and phosphorus - Continue calcium acetate with meals.    LOS: [place], [place] 12/2/201811:01 AM

## 2017-05-02 NOTE — Consult Note (Signed)
[facility]  Date of admission:  04/30/2017  Inpatient day:  05/02/2017  Consulting physician:  Dr. Lavonia Dana.   Reason for Consultation:  Anemia.  Chief Complaint: Todd Brown is a 56 y.o. male with ESRD on dialysis admitted with anemia.  HPI: The patient is a poor historian.  He has a history of diabetes, end-stage renal disease on dialysis, renal artery stenosis and peripheral arterial disease. He was noted to be anemic in the dialysis center. Specifically, hematocrit was 16.2, hemoglobin 5.5 and MCV 110 on 04/30/2017. Creatinine was 2.45.  PTT was 45.2 with an INR of 4.88.  He is on Coumadin for history of upper extremity DVT.  Patient denies any symptoms.  He feels "about the same".  He denies any melena or hematochezia but admits that he "doesn't look".  He voids little and denies any hematuria.  He receives erythropoietin with his dialysis.  He states that his diet is good.  He has never had a colonoscopy or EGD.  Last CBC prior to recent events included a hematocrit 31.5, hemoglobin 11.0 and MCV 104.3 on 08/17/2016.  Differential was unremarkable.  Additional labs on 05/01/2017 revealed a normal B12 and folate.  Ferritin was 96 with an iron saturation of 25% and a TIBC of 268.  Guaiac cards was positive.  He has received 2 units of packed red blood cells.  Hemoglobin improved from 5.5 to 7.1.  He is scheduled for 2 additional units today.   Past Medical History:  Diagnosis Date  . Anemia   . Diabetes mellitus without complication ([place])   . Dialysis patient [facility])    Mon. -Wed.- Fri  . DVT (deep venous thrombosis) ([place])    cephalic and basolic vein thrombosis  . ESRD (end stage renal disease) ([place])   . Hyperlipidemia   . Malignant hypertension   . Renal artery stenosis ([name])   . Schizophrenia [facility])     Past Surgical History:  Procedure Laterality Date  . A/V FISTULAGRAM N/A 08/06/2016   Procedure: A/V Fistulagram;  Surgeon: Algernon Huxley, MD;   Location: [place] [facility];  Service: Cardiovascular;  Laterality: N/A;  . A/V SHUNT INTERVENTION N/A 08/06/2016   Procedure: A/V Shunt Intervention;  Surgeon: Algernon Huxley, MD;  Location: [place] [facility];  Service: Cardiovascular;  Laterality: N/A;  . AV FISTULA PLACEMENT Left 12/26/2014   Procedure: ARTERIOVENOUS (AV) FISTULA CREATION;  Surgeon: Algernon Huxley, MD;  Location: [facility] ORS;  Service: Vascular;  Laterality: Left;  . INSERTION OF DIALYSIS CATHETER Right   . PERIPHERAL VASCULAR CATHETERIZATION N/A 10/22/2014   Procedure: Dialysis/Perma Catheter Insertion;  Surgeon: Algernon Huxley, MD;  Location: [place] [facility];  Service: Cardiovascular;  Laterality: N/A;  . PERIPHERAL VASCULAR CATHETERIZATION N/A 02/28/2015   Procedure: Dialysis/Perma Catheter Removal;  Surgeon: Algernon Huxley, MD;  Location: [place] [facility];  Service: Cardiovascular;  Laterality: N/A;  . Repair fx left lower leg     yrs ago (age 54)    Family History  Problem Relation Age of Onset  . Diabetes Neg Hx     Social History:  reports that he has been smoking cigarettes.  He has been smoking about 0.50 packs per day. he has never used smokeless tobacco. He reports that he does not drink alcohol or use drugs.  The patient lives alone in [place].  He is alone today.  Allergies:  Allergies  Allergen Reactions  . Thorazine [Chlorpromazine] Other (See Comments)  Reaction:  Unknown , pt states it makes him feel real bad    Medications Prior to Admission  Medication Sig Dispense Refill  . acetaminophen (TYLENOL) 500 MG tablet Take 500 mg by mouth every 4 (four) hours as needed for mild pain. Take 1 tablet every 4-6 hours as needed for pain.    Marland Kitchen albuterol (PROAIR HFA) 108 (90 Base) MCG/ACT inhaler Inhale 1-2 puffs into the lungs every 6 (six) hours as needed for wheezing or shortness of breath (cough).    Marland Kitchen amLODipine (NORVASC) 10 MG tablet Take 10 mg by mouth daily.    Marland Kitchen b complex-vitamin c-folic acid  (NEPHRO-VITE) 0.8 MG TABS tablet Take 1 tablet by mouth daily.    . baclofen (LIORESAL) 10 MG tablet Take 1 tablet (10 mg total) by mouth 2 (two) times daily as needed for muscle spasms. 30 each 0  . calcium acetate (PHOSLO) 667 MG capsule Take 2 capsules (1,334 mg total) by mouth 3 (three) times daily with meals. 60 capsule 1  . carbamazepine (TEGRETOL) 200 MG tablet Take 2.5 tablets (500 mg total) by mouth 2 (two) times daily. 60 tablet 0  . cholecalciferol (VITAMIN D) 1000 UNITS tablet Take 1,000 Units by mouth daily.    . cloNIDine (CATAPRES) 0.3 MG tablet Take 1 tablet (0.3 mg total) by mouth 3 (three) times daily. 60 tablet 11  . cyclobenzaprine (FLEXERIL) 5 MG tablet Take 5 mg by mouth at bedtime as needed for muscle spasms.    Marland Kitchen docusate sodium (COLACE) 100 MG capsule Take 1 capsule (100 mg total) by mouth daily. 10 capsule 0  . ferrous sulfate 325 (65 FE) MG tablet Take 1 tablet (325 mg total) by mouth daily. 30 tablet 3  . fluPHENAZine (PROLIXIN) 2.5 MG tablet Take 2.5 mg by mouth daily.     . fluticasone furoate-vilanterol (BREO ELLIPTA) 100-25 MCG/INH AEPB Inhale 1 puff into the lungs daily.    . hydrALAZINE (APRESOLINE) 100 MG tablet Take 1 tablet (100 mg total) by mouth every 6 (six) hours. 120 tablet 2  . lidocaine (LIDODERM) 5 % Place 1 patch onto the skin daily. Remove & Discard patch within 12 hours or as directed by MD 30 patch 0  . minoxidil (LONITEN) 10 MG tablet Take 1 tablet (10 mg total) by mouth daily. 30 tablet 2  . oxyCODONE-acetaminophen (PERCOCET) 7.5-325 MG tablet Take 1 tablet by mouth every 4 (four) hours as needed for moderate pain or severe pain. 20 tablet 0  . trihexyphenidyl (ARTANE) 5 MG tablet Take 5 mg by mouth 3 (three) times daily with meals.    . warfarin (COUMADIN) 10 MG tablet Take 1 tablet (10 mg total) by mouth daily. 30 tablet 0    Review of Systems: GENERAL:  Feels good.  No fevers, sweats or weight loss. PERFORMANCE STATUS (ECOG):  2 HEENT:  No  visual changes, runny nose, sore throat, mouth sores or tenderness. Lungs: No shortness of breath or cough.  No hemoptysis. Cardiac:  No chest pain, palpitations, orthopnea, or PND. GI:  Transient abdominal pain.  No nausea, vomiting, diarrhea, or constipation.  Unable to assess melena or hematochezia. GU:  Little urine output.  No urgency, frequency, dysuria, or hematuria. Musculoskeletal:  No back pain.  No joint pain.  No muscle tenderness. Extremities:  No pain or swelling. Skin:  No rashes or skin changes. Neuro:  No headache, numbness or weakness, balance or coordination issues. Endocrine:  Diabetes.  No thyroid issues, hot flashes or night  sweats. Psych:  No mood changes, depression or anxiety. Pain:  No focal pain. Review of systems:  All other systems reviewed and found to be negative.  Physical Exam:  Blood pressure 117/63, pulse 77, temperature 98.7 F (37.1 C), temperature source Oral, resp. rate 20, height 6' 2"  (1.88 m), weight 192 lb 0.3 oz (87.1 kg), SpO2 97 %.  GENERAL:  Chronically ill appearing gentleman sitting comfortably on the medical unit in no acute distress. MENTAL STATUS:  Alert and oriented to person, place and time. HEAD:  Dark curly hair with some graying.  Male pattern baldness.  Normocephalic, atraumatic, face symmetric, no Cushingoid features. EYES:  Brown eyes.  Ruddy.  Pupils equal round and reactive to light and accomodation.  No conjunctivitis or scleral icterus. ENT:  Oropharynx clear without lesion.  Tongue normal.  Edentulous.  Mucous membranes moist.  RESPIRATORY:  Clear to auscultation without rales, wheezes or rhonchi. CARDIOVASCULAR:  Regular rate and rhythm without murmur, rub or gallop. ABDOMEN:  Soft, non-tender, with active bowel sounds, and no hepatosplenomegaly.  No masses. SKIN:  No rashes, ulcers or lesions. EXTREMITIES: Left upper extremity AV fistula.  No edema, no skin discoloration or tenderness.  No palpable cords. LYMPH NODES: No  palpable cervical, supraclavicular, or axillary adenopathy  NEUROLOGICAL: Unremarkable.  Speech slightly difficult to understand. PSYCH:  Appropriate.   Results for orders placed or performed during the hospital encounter of 04/30/17 (from the past 48 hour(s))  Basic metabolic panel     Status: Abnormal   Collection Time: 04/30/17  6:43 PM  Result Value Ref Range   Sodium 137 135 - 145 mmol/L   Potassium 3.1 (L) 3.5 - 5.1 mmol/L   Chloride 97 (L) 101 - 111 mmol/L   CO2 31 22 - 32 mmol/L   Glucose, Bld 103 (H) 65 - 99 mg/dL   BUN 17 6 - 20 mg/dL   Creatinine, Ser 2.45 (H) 0.61 - 1.24 mg/dL   Calcium 8.4 (L) 8.9 - 10.3 mg/dL   GFR calc non Af Amer 28 (L) >60 mL/min   GFR calc Af Amer 32 (L) >60 mL/min    Comment: (NOTE) The eGFR has been calculated using the CKD EPI equation. This calculation has not been validated in all clinical situations. eGFR's persistently <60 mL/min signify possible Chronic Kidney Disease.    Anion gap 9 5 - 15  CBC with Differential     Status: Abnormal   Collection Time: 04/30/17  6:43 PM  Result Value Ref Range   WBC 4.7 3.8 - 10.6 K/uL   RBC 1.47 (L) 4.40 - 5.90 MIL/uL   Hemoglobin 5.5 (L) 13.0 - 18.0 g/dL   HCT 16.2 (L) 40.0 - 52.0 %   MCV 110.0 (H) 80.0 - 100.0 fL   MCH 37.5 (H) 26.0 - 34.0 pg   MCHC 34.1 32.0 - 36.0 g/dL   RDW 17.6 (H) 11.5 - 14.5 %   Platelets 212 150 - 440 K/uL   Neutrophils Relative % 72 %   Neutro Abs 3.4 1.4 - 6.5 K/uL   Lymphocytes Relative 14 %   Lymphs Abs 0.6 (L) 1.0 - 3.6 K/uL   Monocytes Relative 11 %   Monocytes Absolute 0.5 0.2 - 1.0 K/uL   Eosinophils Relative 2 %   Eosinophils Absolute 0.1 0 - 0.7 K/uL   Basophils Relative 1 %   Basophils Absolute 0.0 0 - 0.1 K/uL  Protime-INR     Status: Abnormal   Collection Time: 04/30/17  6:43  PM  Result Value Ref Range   Prothrombin Time 45.2 (H) 11.4 - 15.2 seconds   INR 4.88 (HH)     Comment: CRITICAL RESULT CALLED TO, READ BACK BY AND VERIFIED WITH: KENNY PATEL  AT 2020 ON 04/30/2017 JJB   Type and screen [place]     Status: None (Preliminary result)   Collection Time: 04/30/17  6:43 PM  Result Value Ref Range   ABO/RH(D) O POS    Antibody Screen NEG    Sample Expiration 05/03/2017    Unit Number [id number]    Blood Component Type RBC, LR IRR    Unit division 00    Status of Unit ISSUED,FINAL    Transfusion Status OK TO TRANSFUSE    Crossmatch Result Compatible    Unit Number [id number]    Blood Component Type RBC, LR IRR    Unit division 00    Status of Unit ISSUED,FINAL    Transfusion Status OK TO TRANSFUSE    Crossmatch Result Compatible    Unit Number [id number]    Blood Component Type RBC, LR IRR    Unit division 00    Status of Unit ISSUED    Transfusion Status OK TO TRANSFUSE    Crossmatch Result Compatible    Unit Number [id number]    Blood Component Type RBC, LR IRR    Unit division 00    Status of Unit REL FROM [facility]    Transfusion Status OK TO TRANSFUSE    Crossmatch Result Compatible   Prepare RBC     Status: None   Collection Time: 04/30/17  9:30 PM  Result Value Ref Range   Order Confirmation ORDER PROCESSED BY BLOOD BANK   MRSA PCR Screening     Status: Abnormal   Collection Time: 04/30/17 11:14 PM  Result Value Ref Range   MRSA by PCR POSITIVE (A) NEGATIVE    Comment:        The GeneXpert MRSA Assay (FDA approved for NASAL specimens only), is one component of a comprehensive MRSA colonization surveillance program. It is not intended to diagnose MRSA infection nor to guide or monitor treatment for MRSA infections. RESULT CALLED TO, READ BACK BY AND VERIFIED WITH: tamara conyers at 0118 on 05/01/17 rww   Protime-INR     Status: Abnormal   Collection Time: 05/01/17  6:48 AM  Result Value Ref Range   Prothrombin Time 35.0 (H) 11.4 - 15.2 seconds   INR 1.22   Basic metabolic panel     Status: Abnormal   Collection Time: 05/01/17  6:48 AM  Result Value Ref Range    Sodium 137 135 - 145 mmol/L   Potassium 3.5 3.5 - 5.1 mmol/L   Chloride 98 (L) 101 - 111 mmol/L   CO2 30 22 - 32 mmol/L   Glucose, Bld 70 65 - 99 mg/dL   BUN 23 (H) 6 - 20 mg/dL   Creatinine, Ser 3.27 (H) 0.61 - 1.24 mg/dL   Calcium 8.6 (L) 8.9 - 10.3 mg/dL   GFR calc non Af Amer 20 (L) >60 mL/min   GFR calc Af Amer 23 (L) >60 mL/min    Comment: (NOTE) The eGFR has been calculated using the CKD EPI equation. This calculation has not been validated in all clinical situations. eGFR's persistently <60 mL/min signify possible Chronic Kidney Disease.    Anion gap 9 5 - 15  CBC     Status: Abnormal   Collection Time: 05/01/17  6:48 AM  Result Value Ref Range   WBC 6.7 3.8 - 10.6 K/uL   RBC 1.95 (L) 4.40 - 5.90 MIL/uL   Hemoglobin 7.1 (L) 13.0 - 18.0 g/dL    Comment: RESULT REPEATED AND VERIFIED   HCT 19.7 (L) 40.0 - 52.0 %   MCV 101.1 (H) 80.0 - 100.0 fL    Comment: RESULT REPEATED AND VERIFIED   MCH 36.4 (H) 26.0 - 34.0 pg   MCHC 36.0 32.0 - 36.0 g/dL   RDW 21.2 (H) 11.5 - 14.5 %   Platelets 189 150 - 440 K/uL  Iron and TIBC     Status: None   Collection Time: 05/01/17  6:48 AM  Result Value Ref Range   Iron 67 45 - 182 ug/dL   TIBC 268 250 - 450 ug/dL   Saturation Ratios 25 17.9 - 39.5 %   UIBC 201 ug/dL  Ferritin     Status: None   Collection Time: 05/01/17  6:48 AM  Result Value Ref Range   Ferritin 96 24 - 336 ng/mL  Folate     Status: None   Collection Time: 05/01/17  6:48 AM  Result Value Ref Range   Folate 31.0 >5.9 ng/mL  Vitamin B12     Status: Abnormal   Collection Time: 05/01/17 10:17 AM  Result Value Ref Range   Vitamin B-12 1,483 (H) 180 - 914 pg/mL    Comment: (NOTE) This assay is not validated for testing neonatal or myeloproliferative syndrome specimens for Vitamin B12 levels. Performed at [facility], [name] [address] [place], [place] [address]   Occult blood card to lab, stool RN will collect     Status: Abnormal   Collection Time: 05/01/17  11:43 AM  Result Value Ref Range   Fecal Occult Bld POSITIVE (A) NEGATIVE  CBC     Status: Abnormal   Collection Time: 05/02/17  3:58 AM  Result Value Ref Range   WBC 6.1 3.8 - 10.6 K/uL   RBC 1.88 (L) 4.40 - 5.90 MIL/uL   Hemoglobin 6.5 (L) 13.0 - 18.0 g/dL   HCT 19.0 (L) 40.0 - 52.0 %   MCV 101.2 (H) 80.0 - 100.0 fL   MCH 34.8 (H) 26.0 - 34.0 pg   MCHC 34.4 32.0 - 36.0 g/dL   RDW 22.0 (H) 11.5 - 14.5 %   Platelets 170 150 - 440 K/uL  Differential     Status: None   Collection Time: 05/02/17  3:58 AM  Result Value Ref Range   Neutrophils Relative % 67 %   Neutro Abs 4.2 1.4 - 6.5 K/uL   Lymphocytes Relative 19 %   Lymphs Abs 1.2 1.0 - 3.6 K/uL   Monocytes Relative 13 %   Monocytes Absolute 0.8 0.2 - 1.0 K/uL   Eosinophils Relative 1 %   Eosinophils Absolute 0.1 0 - 0.7 K/uL   Basophils Relative 0 %   Basophils Absolute 0.0 0 - 0.1 K/uL  Prepare RBC     Status: None   Collection Time: 05/02/17  8:30 AM  Result Value Ref Range   Order Confirmation ORDER PROCESSED BY BLOOD BANK    Dg Chest 2 View  Result Date: 05/01/2017 CLINICAL DATA:  Symptomatic anemia. EXAM: CHEST  2 VIEW COMPARISON:  Radiographs 08/13/2016 FINDINGS: Cardiomegaly is similar. Mediastinal contours are unchanged. Fine reticular opacities are chronic common unchanged from prior exam. Possible trace left pleural effusion. No focal consolidation or pneumothorax. No acute osseous abnormalities. IMPRESSION: Unchanged cardiomegaly. Unchanged fine reticular  opacities that may be chronic pulmonary edema. Possible trace left pleural effusion. Electronically Signed   By: Jeb Levering M.D.   On: 05/01/2017 00:27   Ct Head Wo Contrast  Result Date: 04/30/2017 CLINICAL DATA:  56 y/o  M; anemia and generalized weakness. EXAM: CT HEAD WITHOUT CONTRAST TECHNIQUE: Contiguous axial images were obtained from the base of the skull through the vertex without intravenous contrast. COMPARISON:  None. FINDINGS: Brain: No evidence  of acute infarction, hemorrhage, hydrocephalus, extra-axial collection or mass lesion/mass effect. Mild parenchymal volume loss. Vascular: Mild calcific atherosclerosis of carotid siphons. Skull: Normal. Negative for fracture or focal lesion. Sinuses/Orbits: No acute finding. Other: Bilateral external auditory canal opacification, likely cerumen. IMPRESSION: No acute intracranial abnormality identified. Mild brain parenchymal volume loss. Electronically Signed   By: Kristine Garbe M.D.   On: 04/30/2017 22:03    Assessment:  The patient is a 56 y.o. gentleman with ESRD on dialysis admitted with a macrocytic anemia.  He denies any melena or hematochezia but admits that he "does not look".  Stool is guaiac +.  He has never had a colonoscopy or EGD.  Diet is good.  Work-up to date reveals the following normal labs: ferritin, iron studies, B12, folate.  He receives Procrit for anemia associated with chronic renal disease.  He has been transfused with 2 units of PRBCs with an appropriate increase (hemoglobin 5.5 to 7.1).  He is scheduled for 2 additional units today.  He is on Coumadin for an upper extremity DVT.  Coumadin is being held for an elevated INR (4.88)  Plan:   1.  Hematology:  Hemoglobin 11.0 in 07/2016 and 5.5 on 04/30/2017.  Diet appears good.  Patient receiving Procrit with dialysis (? low dose).  Concern for GI bleeding with guaiac + stool.  Await EGD and colonoscopy tomorrow.  Additional labs sent:  reticulocyte count, epo level, TSH, free T4, DAT.  Peripheral smear for path review.  Thank you for allowing me to participate in LAVONTAE CORNIA 's care.  I will follow him closely with you while hospitalized and after discharge in the outpatient department.   Lequita Asal, MD  05/02/2017, 12:46 PM

## 2017-05-02 NOTE — Progress Notes (Signed)
Dr Vicente Males notified pt refusing to drink golyetly. Dr acknowledged.

## 2017-05-02 NOTE — Progress Notes (Signed)
[facility] responded to request to follow-up with patient. Patient was resting. CH provided prayer outside the room. CH will keep requests open for follow-up.   05/02/17 1600  Clinical Encounter Type  Visited With Patient  Visit Type Initial;Spiritual support  Referral From Nurse

## 2017-05-03 ENCOUNTER — Encounter: Payer: Self-pay | Admitting: Certified Registered Nurse Anesthetist

## 2017-05-03 ENCOUNTER — Encounter
Admission: EM | Disposition: A | Discharge status: Home / Self Care | Payer: Self-pay | Source: Home / Self Care | Attending: Specialist

## 2017-05-03 LAB — BPAM RBC
BLOOD PRODUCT EXPIRATION DATE: 201812122359
BLOOD PRODUCT EXPIRATION DATE: 201812172359
Blood Product Expiration Date: 201812182359
Blood Product Expiration Date: 201812182359
ISSUE DATE / TIME: 201812020951
ISSUE DATE / TIME: 201811302326
ISSUE DATE / TIME: 201812010230
UNIT TYPE AND RH: 5100
UNIT TYPE AND RH: 9500
Unit Type and Rh: 5100
Unit Type and Rh: 5100

## 2017-05-03 LAB — RETICULOCYTES
RBC.: 2.31 MIL/uL — ABNORMAL LOW (ref 4.40–5.90)
Retic Count, Absolute: 92.4 10*3/uL (ref 19.0–183.0)
Retic Ct Pct: 4 % — ABNORMAL HIGH (ref 0.4–3.1)

## 2017-05-03 LAB — TYPE AND SCREEN
ABO/RH(D): O POS
Antibody Screen: NEGATIVE
UNIT DIVISION: 0
UNIT DIVISION: 0
Unit division: 0
Unit division: 0

## 2017-05-03 LAB — CBC WITH DIFFERENTIAL/PLATELET
Basophils Absolute: 0 10*3/uL (ref 0–0.1)
Basophils Relative: 0 %
Eosinophils Absolute: 0.2 10*3/uL (ref 0–0.7)
Eosinophils Relative: 2 %
HCT: 22.7 % — ABNORMAL LOW (ref 40.0–52.0)
Hemoglobin: 8 g/dL — ABNORMAL LOW (ref 13.0–18.0)
Lymphocytes Relative: 13 %
Lymphs Abs: 1.1 10*3/uL (ref 1.0–3.6)
MCH: 34.9 pg — ABNORMAL HIGH (ref 26.0–34.0)
MCHC: 35.5 g/dL (ref 32.0–36.0)
MCV: 98.3 fL (ref 80.0–100.0)
Monocytes Absolute: 1.1 10*3/uL — ABNORMAL HIGH (ref 0.2–1.0)
Monocytes Relative: 12 %
Neutro Abs: 6.5 10*3/uL (ref 1.4–6.5)
Neutrophils Relative %: 73 %
Platelets: 163 10*3/uL (ref 150–440)
RBC: 2.31 MIL/uL — ABNORMAL LOW (ref 4.40–5.90)
RDW: 21.7 % — ABNORMAL HIGH (ref 11.5–14.5)
WBC: 8.9 10*3/uL (ref 3.8–10.6)

## 2017-05-03 LAB — DAT, POLYSPECIFIC AHG (ARMC ONLY): Polyspecific AHG test: NEGATIVE

## 2017-05-03 LAB — T4, FREE: Free T4: 0.57 ng/dL — ABNORMAL LOW (ref 0.61–1.12)

## 2017-05-03 LAB — PATHOLOGIST SMEAR REVIEW

## 2017-05-03 LAB — TSH: TSH: 10.625 u[IU]/mL — ABNORMAL HIGH (ref 0.350–4.500)

## 2017-05-03 SURGERY — ESOPHAGOGASTRODUODENOSCOPY (EGD) WITH PROPOFOL
Anesthesia: General

## 2017-05-03 MED ORDER — FLUPHENAZINE HCL 2.5 MG PO TABS
2.5000 mg | ORAL_TABLET | Freq: Every day | ORAL | Status: DC
  Administered 2017-05-03 – 2017-05-07 (×5): 2.5 mg via ORAL
  Filled 2017-05-03 (×5): qty 1

## 2017-05-03 MED ORDER — CARBAMAZEPINE ER 400 MG PO TB12
1000.0000 mg | ORAL_TABLET | Freq: Every day | ORAL | Status: DC
  Filled 2017-05-03: qty 1

## 2017-05-03 NOTE — Progress Notes (Signed)
HD TX completed

## 2017-05-03 NOTE — Progress Notes (Signed)
[facility], [place] 05/03/17  Subjective:   Laying in the bed, no acute complaints No shortness of breath, no nausea or vomiting Patient refused bowel preparation  Objective:  Vital signs in last 24 hours:  Temp:  [97.8 F (36.6 C)-98.7 F (37.1 C)] 97.8 F (36.6 C) (12/03 1033) Pulse Rate:  [67-80] 77 (12/03 1033) Resp:  [16-20] 20 (12/03 1033) BP: (116-159)/(64-84) 128/64 (12/03 1033) SpO2:  [93 %-97 %] 93 % (12/03 1033)  Weight change:  Filed Weights   04/30/17 1832 04/30/17 2218 05/01/17 1155  Weight: 85.7 kg (189 lb) 87.1 kg (192 lb) 87.1 kg (192 lb 0.3 oz)    Intake/Output:    Intake/Output Summary (Last 24 hours) at 05/03/2017 1144 Last data filed at 05/03/2017 0239 Gross per 24 hour  Intake 392.3 ml  Output 1 ml  Net 391.3 ml     Physical Exam: General:  No acute distress, laying in the bed  HEENT  anicteric, moist oral mucous membranes  Neck  supple  Pulm/lungs  normal breathing effort, clear to auscultation  CVS/Heart  no rub or gallop  Abdomen:   Soft, nontender  Extremities:  Trace edema  Neurologic:  Alert, able to answer questions, speech is  difficult to understand  Skin:  No acute rashes  Access:  Left arm AV fistula       Basic Metabolic Panel:  Recent Labs  Lab 04/30/17 1200 04/30/17 1843 05/01/17 0648  NA  --  137 137  K 4.0 3.1* 3.5  CL  --  97* 98*  CO2  --  31 30  GLUCOSE  --  103* 70  BUN  --  17 23*  CREATININE  --  2.45* 3.27*  CALCIUM  --  8.4* 8.6*     CBC: Recent Labs  Lab 04/30/17 1843 05/01/17 0648 05/02/17 0358 05/02/17 1625 05/03/17 0400  WBC 4.7 6.7 6.1  --  8.9  NEUTROABS 3.4  --  4.2  --  6.5  HGB 5.5* 7.1* 6.5* 7.8* 8.0*  HCT 16.2* 19.7* 19.0* 22.8* 22.7*  MCV 110.0* 101.1* 101.2*  --  98.3  PLT 212 189 170  --  163      Lab Results  Component Value Date   HEPBSAG Negative 08/07/2016   HEPBSAB Reactive 05/26/2016      Microbiology:  Recent Results (from the  past 240 hour(s))  MRSA PCR Screening     Status: Abnormal   Collection Time: 04/30/17 11:14 PM  Result Value Ref Range Status   MRSA by PCR POSITIVE (A) NEGATIVE Final    Comment:        The GeneXpert MRSA Assay (FDA approved for NASAL specimens only), is one component of a comprehensive MRSA colonization surveillance program. It is not intended to diagnose MRSA infection nor to guide or monitor treatment for MRSA infections. RESULT CALLED TO, READ BACK BY AND VERIFIED WITH: tamara conyers at 0118 on 05/01/17 rww     Coagulation Studies: Recent Labs    04/30/17 1843 05/01/17 0648  LABPROT 45.2* 35.0*  INR 4.88* 3.52    Urinalysis: No results for input(s): COLORURINE, LABSPEC, PHURINE, GLUCOSEU, HGBUR, BILIRUBINUR, KETONESUR, PROTEINUR, UROBILINOGEN, NITRITE, LEUKOCYTESUR in the last 72 hours.  Invalid input(s): APPERANCEUR    Imaging: No results found.   Medications:   . sodium chloride     . calcium acetate  1,334 mg Oral TID WC  . carbamazepine  500 mg Oral BID  . Chlorhexidine Gluconate Cloth  6 each Topical Q0600  . cholecalciferol  1,000 Units Oral Daily  . epoetin (EPOGEN/PROCRIT) injection  10,000 Units Intravenous Q M,W,F-HD  . ferrous sulfate  325 mg Oral Daily  . fluticasone  2 spray Each Nare Daily  . fluticasone furoate-vilanterol  1 puff Inhalation Daily  . midodrine  10 mg Oral Once in dialysis  . multivitamin  1 tablet Oral QHS  . mupirocin ointment  1 application Nasal BID  . trihexyphenidyl  5 mg Oral TID WC   acetaminophen, baclofen, bisacodyl, cyclobenzaprine, docusate sodium, guaiFENesin-dextromethorphan, oxyCODONE-acetaminophen  Assessment/ Plan:  56 y.o. African-American male with end stage renal disease on hemodialysis, schizophrenia, hypertension, chronic hepatitis C, history of DVT. Admitted for symptomatic anemia  [facility] MWF [place] Left AVF.  EDW 86 kg  1.  End-stage renal disease 2.  Anemia of chronic kidney  disease 3.  Secondary hyperparathyroidism  Hemodialysis today Patient refused bowel prep  Hemoglobin 8.0 this morning.  That is up from 6.5 yesterday Midodrine and dialysis to avoid hypotension Continue high-dose Neupogen with dialysis Calcium acetate for phosphorus binding    LOS: 3 [facility] 12/3/201811:44 AM  [place], [place]

## 2017-05-03 NOTE — Progress Notes (Signed)
HD TX Started

## 2017-05-03 NOTE — Progress Notes (Signed)
[place] [facility] [place] NAME: Todd Brown    MR#:  [id number]  DATE OF BIRTH:  03-01-1961  SUBJECTIVE:   Hemoglobin stable at 8.0 today. No acute bleeding overnight. Patient refuses to get his colonoscopy prep overnight and also refused endoscopy this morning. No complaints presently.  REVIEW OF SYSTEMS:    Review of Systems  Constitutional: Negative for chills and fever.  HENT: Negative for congestion and tinnitus.   Eyes: Negative for blurred vision and double vision.  Respiratory: Positive for shortness of breath. Negative for cough and wheezing.   Cardiovascular: Negative for chest pain, orthopnea and PND.  Gastrointestinal: Negative for abdominal pain, diarrhea, nausea and vomiting.  Genitourinary: Negative for dysuria and hematuria.  Neurological: Negative for dizziness, sensory change and focal weakness.  All other systems reviewed and are negative.   Nutrition: Renal w/ Fluid restriction Tolerating Diet: Yes Tolerating PT: Await Eval   DRUG ALLERGIES:   Allergies  Allergen Reactions  . Thorazine [Chlorpromazine] Other (See Comments)    Reaction:  Unknown , pt states it makes him feel real bad    VITALS:  Blood pressure 138/82, pulse 69, temperature 98.2 F (36.8 C), temperature source Oral, resp. rate 16, height 6' (1.829 m), weight 87.1 kg (192 lb 0.3 oz), SpO2 97 %.  PHYSICAL EXAMINATION:   Physical Exam  GENERAL:  56 y.o.-year-old patient lying in bed in no acute distress.  EYES: Pupils equal, round, reactive to light and accommodation. No scleral icterus. Extraocular muscles intact.  HEENT: Head atraumatic, normocephalic. Oropharynx and nasopharynx clear.  NECK:  Supple, no jugular venous distention. No thyroid enlargement, no tenderness.  LUNGS: Normal breath sounds bilaterally, no wheezing, rales, rhonchi. No use of accessory muscles of respiration.  CARDIOVASCULAR: S1, S2 normal. No murmurs, rubs, or gallops.   ABDOMEN: Soft, nontender, nondistended. Bowel sounds present. No organomegaly or mass.  EXTREMITIES: No cyanosis, clubbing or edema b/l.    NEUROLOGIC: Cranial nerves II through XII are intact. No focal Motor or sensory deficits b/l.  Globally weak and difficult to understand PSYCHIATRIC: The patient is alert and oriented x 2.  SKIN: No obvious rash, lesion, or ulcer.   Left upper Ext. AV fistula with good bruit/thrill.   LABORATORY PANEL:   CBC Recent Labs  Lab 05/03/17 0400  WBC 8.9  HGB 8.0*  HCT 22.7*  PLT 163   ------------------------------------------------------------------------------------------------------------------  Chemistries  Recent Labs  Lab 05/01/17 0648  NA 137  K 3.5  CL 98*  CO2 30  GLUCOSE 70  BUN 23*  CREATININE 3.27*  CALCIUM 8.6*   ------------------------------------------------------------------------------------------------------------------  Cardiac Enzymes No results for input(s): TROPONINI in the last 168 hours. ------------------------------------------------------------------------------------------------------------------  RADIOLOGY:  No results found.   ASSESSMENT AND PLAN:   56 year old male with past medical history of end-stage renal disease and hemodialysis, schizophrenia, essential hypertension, previous history of DVT, diabetes, chronic anemia who presents to the hospital due to hemoglobin of 5.5.   1. Acute on chronic symptomatic anemia- pt. Stable at 8.0 today. -Patient was Hemoccult positive and plan was to do upper GI endoscopy and colonoscopy today. Patient refused a colonoscopy prep, and refuses to get upper GI endoscopy done today. -Continue to hold Coumadin for now, follow serial hemoglobin. If he continues to have significant acute bleeding with consider getting a nuclear medicine bleeding scan. Appreciate gastroenterology input  2. End-stage renal disease on hemodialysis- -Continue dialysis on Monday Wednesday  and Friday. Nephro following.  3. Secondary hyperparathyroidism-continue  PhosLo.  4. Previous history of DVT-Coumadin on hold given the patient's acute anemia.  5. Essential HTN - BP meds on hold pt. Is still normotensive.   6. COPD - no acute exacerbation.  - cont. Breo-Ellipta.   7. Hx of Schizophrenia - cont. Tegretol, Prolixin.    All the records are reviewed and case discussed with Care Management/Social Worker. Management plans discussed with the patient, family and they are in agreement.  CODE STATUS: Full code  DVT Prophylaxis: Coumadin   TOTAL TIME TAKING CARE OF THIS PATIENT: 30 minutes.   POSSIBLE D/C IN 1-2 DAYS, DEPENDING ON CLINICAL CONDITION.   Henreitta Leber M.D on 05/03/2017 at 2:48 PM  Between 7am to 6pm - Pager - [phone]  After 6pm go to [email or web address] - Proofreader  [facility]  Office  [phone]  CC: Primary care physician; McLean-Scocuzza, Nino Glow, MD

## 2017-05-03 NOTE — Progress Notes (Signed)
Todd Brown , MD [address], [place], [place], [place], [address] [address] [place], [place], [place], [address] Phone: [phone]  Fax: [place] is being followed for severe anemia  Day 2 of follow up   Subjective: Refused to drink bowel; prep and refused to come to the endo unit to have his EGD   Objective: Vital signs in last 24 hours: Vitals:   05/02/17 1359 05/02/17 1929 05/03/17 0430 05/03/17 1033  BP: 116/72 (!) 159/77 139/84 128/64  Pulse: 67 75 80 77  Resp: 18 16 18 20   Temp: 98.7 F (37.1 C) 98.3 F (36.8 C) 98.1 F (36.7 C) 97.8 F (36.6 C)  TempSrc: Oral Oral Oral Tympanic  SpO2: 97% 95% 97% 93%  Weight:      Height:    6' (1.829 m)   Weight change:   Intake/Output Summary (Last 24 hours) at 05/03/2017 1047 Last data filed at 05/03/2017 0239 Gross per 24 hour  Intake 392.3 ml  Output 1 ml  Net 391.3 ml     Exam: Heart:: Regular rate and rhythm Lungs: normal Abdomen: soft, nontender, normal bowel sounds   Lab Results: @LABTEST2 @ Micro Results: Recent Results (from the past 240 hour(s))  MRSA PCR Screening     Status: Abnormal   Collection Time: 04/30/17 11:14 PM  Result Value Ref Range Status   MRSA by PCR POSITIVE (A) NEGATIVE Final    Comment:        The GeneXpert MRSA Assay (FDA approved for NASAL specimens only), is one component of a comprehensive MRSA colonization surveillance program. It is not intended to diagnose MRSA infection nor to guide or monitor treatment for MRSA infections. RESULT CALLED TO, READ BACK BY AND VERIFIED WITH: tamara conyers at 0118 on 05/01/17 rww    Studies/Results: No results found. Medications: I have reviewed the patient's current medications. Scheduled Meds: . calcium acetate  1,334 mg Oral TID WC  . carbamazepine  500 mg Oral BID  . Chlorhexidine Gluconate Cloth  6 each Topical Q0600  . cholecalciferol  1,000 Units Oral Daily  . epoetin (EPOGEN/PROCRIT)  injection  10,000 Units Intravenous Q M,W,F-HD  . ferrous sulfate  325 mg Oral Daily  . fluticasone  2 spray Each Nare Daily  . fluticasone furoate-vilanterol  1 puff Inhalation Daily  . midodrine  10 mg Oral Once in dialysis  . multivitamin  1 tablet Oral QHS  . mupirocin ointment  1 application Nasal BID  . trihexyphenidyl  5 mg Oral TID WC   Continuous Infusions: . sodium chloride     PRN Meds:.acetaminophen, baclofen, bisacodyl, cyclobenzaprine, docusate sodium, guaiFENesin-dextromethorphan, oxyCODONE-acetaminophen  CBC Latest Ref Rng & Units 05/03/2017 05/02/2017 05/02/2017  WBC 3.8 - 10.6 K/uL 8.9 - 6.1  Hemoglobin 13.0 - 18.0 g/dL 8.0(L) 7.8(L) 6.5(L)  Hematocrit 40.0 - 52.0 % 22.7(L) 22.8(L) 19.0(L)  Platelets 150 - 440 K/uL 163 - 170     Assessment: Principal Problem:   Symptomatic anemia   Todd Brown is a 56 y.o. y/o male who is on Coumadin for a DVT.  He is on hemodialysis for end-stage renal disease and when noted on  dialysis  to have a low hemoglobin of 5.6 g with a high MCV and he was sent to the hospital to get evaluated and transfused.  He denies any overt blood loss.Normal iron studies . TSH elevated, low free t4  Plan 1.   Patient refused to do bowel prep and  refused to come for an EGD. If he changes his mind will be happy to perform the procedures.  Today during my examination he did seem a bit disorientated to responding to questions but he was clearly oriented to time place and person.  He was aware of the year, the president of the country as well as the month of the year.  2. If he has overt bleeding in the interim get tagged RBC scan .      LOS: 3 days   Todd Bellows, MD 05/03/2017, 10:47 AM

## 2017-05-03 NOTE — Progress Notes (Signed)
POST HD 

## 2017-05-03 NOTE — Progress Notes (Signed)
PRE HD TX

## 2017-05-03 NOTE — Progress Notes (Signed)
PRE HD ASSESSMENT 

## 2017-05-04 ENCOUNTER — Ambulatory Visit (INDEPENDENT_AMBULATORY_CARE_PROVIDER_SITE_OTHER): Payer: Medicaid Other | Admitting: Vascular Surgery

## 2017-05-04 ENCOUNTER — Encounter (INDEPENDENT_AMBULATORY_CARE_PROVIDER_SITE_OTHER): Payer: Medicaid Other

## 2017-05-04 LAB — CBC
HEMATOCRIT: 22.6 % — AB (ref 40.0–52.0)
HEMOGLOBIN: 7.6 g/dL — AB (ref 13.0–18.0)
MCH: 34 pg (ref 26.0–34.0)
MCHC: 33.7 g/dL (ref 32.0–36.0)
MCV: 101 fL — ABNORMAL HIGH (ref 80.0–100.0)
Platelets: 161 10*3/uL (ref 150–440)
RBC: 2.24 MIL/uL — AB (ref 4.40–5.90)
RDW: 21.9 % — ABNORMAL HIGH (ref 11.5–14.5)
WBC: 6.8 10*3/uL (ref 3.8–10.6)

## 2017-05-04 LAB — ERYTHROPOIETIN: ERYTHROPOIETIN: 183.8 m[IU]/mL — AB (ref 2.6–18.5)

## 2017-05-04 MED ORDER — CARBAMAZEPINE ER 200 MG PO TB12
1000.0000 mg | ORAL_TABLET | Freq: Every day | ORAL | Status: DC
  Administered 2017-05-04 – 2017-05-07 (×4): 1000 mg via ORAL
  Filled 2017-05-04 (×4): qty 5

## 2017-05-04 MED ORDER — AMLODIPINE BESYLATE 10 MG PO TABS
10.0000 mg | ORAL_TABLET | Freq: Every day | ORAL | Status: DC
  Administered 2017-05-04 – 2017-05-06 (×3): 10 mg via ORAL
  Filled 2017-05-04 (×3): qty 1

## 2017-05-04 MED ORDER — NEPRO/CARBSTEADY PO LIQD
237.0000 mL | Freq: Two times a day (BID) | ORAL | Status: DC
Start: 2017-05-04 — End: 2017-05-07
  Administered 2017-05-04 – 2017-05-07 (×5): 237 mL via ORAL

## 2017-05-04 MED ORDER — ADULT MULTIVITAMIN W/MINERALS CH: 1.0000 | ORAL_TABLET | ORAL | Status: DC

## 2017-05-04 NOTE — Progress Notes (Signed)
[place] [facility] [place] NAME: Todd Brown    MR#:  [id number]  DATE OF BIRTH:  1960-08-01  SUBJECTIVE:   Hemoglobin stable at 7.6 today. No acute bleeding overnight. Patient refuses to get his colonoscopy/endoscopy.  No acute complaints presently.   REVIEW OF SYSTEMS:    Review of Systems  Constitutional: Negative for chills and fever.  HENT: Negative for congestion and tinnitus.   Eyes: Negative for blurred vision and double vision.  Respiratory: Positive for shortness of breath. Negative for cough and wheezing.   Cardiovascular: Negative for chest pain, orthopnea and PND.  Gastrointestinal: Negative for abdominal pain, diarrhea, nausea and vomiting.  Genitourinary: Negative for dysuria and hematuria.  Neurological: Negative for dizziness, sensory change and focal weakness.  All other systems reviewed and are negative.   Nutrition: Renal w/ Fluid restriction Tolerating Diet: Yes Tolerating PT: Await Eval   DRUG ALLERGIES:   Allergies  Allergen Reactions  . Thorazine [Chlorpromazine] Other (See Comments)    Reaction:  Unknown , pt states it makes him feel real bad    VITALS:  Blood pressure 134/83, pulse 70, temperature 98.8 F (37.1 C), temperature source Oral, resp. rate 17, height 6' (1.829 m), weight 89.5 kg (197 lb 5 oz), SpO2 95 %.  PHYSICAL EXAMINATION:   Physical Exam  GENERAL:  56 y.o.-year-old patient lying in bed in no acute distress.  EYES: Pupils equal, round, reactive to light and accommodation. No scleral icterus. Extraocular muscles intact.  HEENT: Head atraumatic, normocephalic. Oropharynx and nasopharynx clear.  NECK:  Supple, no jugular venous distention. No thyroid enlargement, no tenderness.  LUNGS: Normal breath sounds bilaterally, no wheezing, rales, rhonchi. No use of accessory muscles of respiration.  CARDIOVASCULAR: S1, S2 normal. No murmurs, rubs, or gallops.  ABDOMEN: Soft, nontender,  nondistended. Bowel sounds present. No organomegaly or mass.  EXTREMITIES: No cyanosis, clubbing or edema b/l.    NEUROLOGIC: Cranial nerves II through XII are intact. No focal Motor or sensory deficits b/l.  Globally weak and difficult to understand PSYCHIATRIC: The patient is alert and oriented x 2.  SKIN: No obvious rash, lesion, or ulcer.   Left upper Ext. AV fistula with good bruit/thrill.   LABORATORY PANEL:   CBC Recent Labs  Lab 05/04/17 1202  WBC 6.8  HGB 7.6*  HCT 22.6*  PLT 161   ------------------------------------------------------------------------------------------------------------------  Chemistries  Recent Labs  Lab 05/01/17 0648  NA 137  K 3.5  CL 98*  CO2 30  GLUCOSE 70  BUN 23*  CREATININE 3.27*  CALCIUM 8.6*   ------------------------------------------------------------------------------------------------------------------  Cardiac Enzymes No results for input(s): TROPONINI in the last 168 hours. ------------------------------------------------------------------------------------------------------------------  RADIOLOGY:  No results found.   ASSESSMENT AND PLAN:   56 year old male with past medical history of end-stage renal disease and hemodialysis, schizophrenia, essential hypertension, previous history of DVT, diabetes, chronic anemia who presents to the hospital due to hemoglobin of 5.5.   1. Acute on chronic symptomatic anemia- pt. Stable at 7.6 today. -Patient was Hemoccult positive and plan was to do upper GI endoscopy and colonoscopy but Patient refused a colonoscopy prep, and refuses to get upper GI endoscopy as he is scared about the anesthesia.   -Continue to hold Coumadin for now, follow serial hemoglobin. If he continues to have significant acute bleeding with consider getting a nuclear medicine bleeding scan.   2. End-stage renal disease on hemodialysis -Continue dialysis on Monday Wednesday and Friday. Nephro following.  3.  Secondary hyperparathyroidism-continue  PhosLo.  4. Previous history of DVT-Coumadin on hold given the patient's acute anemia.  5. Essential HTN - will resume Norvasc and if B.P remains stable will resume other meds.   6. COPD - no acute exacerbation.  - cont. Breo-Ellipta.   7. Hx of Schizophrenia - cont. Tegretol, Prolixin.   Pt. Needs to get Endoscopy/colonoscopy to workup his anemia as he was Hemoccult positive but he refuses further workup. I attempted to call the patient's mom but the phone number is disconnected. I think the patient's family needs to discuss and explain to him the urgency of getting workup for his anemia.  All the records are reviewed and case discussed with Care Management/Social Worker. Management plans discussed with the patient, family and they are in agreement.  CODE STATUS: Full code  DVT Prophylaxis: Coumadin   TOTAL TIME TAKING CARE OF THIS PATIENT: 30 minutes.   POSSIBLE D/C IN 1-2 DAYS, DEPENDING ON CLINICAL CONDITION.   Henreitta Leber M.D on 05/04/2017 at 1:49 PM  Between 7am to 6pm - Pager - [phone]  After 6pm go to [email or web address] - [facility]  Office  [phone]  CC: Primary care physician; McLean-Scocuzza, Nino Glow, MD [place] [facility]

## 2017-05-04 NOTE — Progress Notes (Signed)
[facility], [place] 05/04/17  Subjective:   Laying in the bed, no acute complaints No shortness of breath, no nausea or vomiting Patient refused bowel preparation Patient states that he is scared of any procedures that require him to put to sleep 1500 cc removed with dialysis yesterday  Objective:  Vital signs in last 24 hours:  Temp:  [98 F (36.7 C)-99.3 F (37.4 C)] 98.8 F (37.1 C) (12/04 0457) Pulse Rate:  [55-70] 70 (12/04 0457) Resp:  [12-22] 17 (12/04 0457) BP: (115-155)/(70-86) 134/83 (12/04 0457) SpO2:  [95 %-100 %] 95 % (12/04 0457) Weight:  [89.5 kg (197 lb 5 oz)] 89.5 kg (197 lb 5 oz) (12/03 1400)  Weight change:  Filed Weights   04/30/17 2218 05/01/17 1155 05/03/17 1400  Weight: 87.1 kg (192 lb) 87.1 kg (192 lb 0.3 oz) 89.5 kg (197 lb 5 oz)    Intake/Output:    Intake/Output Summary (Last 24 hours) at 05/04/2017 1043 Last data filed at 05/04/2017 0745 Gross per 24 hour  Intake -  Output 2150 ml  Net -2150 ml     Physical Exam: General:  No acute distress, laying in the bed  HEENT  anicteric, moist oral mucous membranes  Neck  supple  Pulm/lungs  normal breathing effort, clear to auscultation  CVS/Heart  no rub or gallop  Abdomen:   Soft, nontender  Extremities:  Trace to 1+ edema  Neurologic:  Alert, able to answer questions, speech is  difficult to understand  Skin:  No acute rashes  Access:  Left arm AV fistula       Basic Metabolic Panel:  Recent Labs  Lab 04/30/17 1200 04/30/17 1843 05/01/17 0648  NA  --  137 137  K 4.0 3.1* 3.5  CL  --  97* 98*  CO2  --  31 30  GLUCOSE  --  103* 70  BUN  --  17 23*  CREATININE  --  2.45* 3.27*  CALCIUM  --  8.4* 8.6*     CBC: Recent Labs  Lab 04/30/17 1843 05/01/17 0648 05/02/17 0358 05/02/17 1625 05/03/17 0400  WBC 4.7 6.7 6.1  --  8.9  NEUTROABS 3.4  --  4.2  --  6.5  HGB 5.5* 7.1* 6.5* 7.8* 8.0*  HCT 16.2* 19.7* 19.0* 22.8* 22.7*  MCV 110.0* 101.1*  101.2*  --  98.3  PLT 212 189 170  --  163      Lab Results  Component Value Date   HEPBSAG Negative 08/07/2016   HEPBSAB Reactive 05/26/2016      Microbiology:  Recent Results (from the past 240 hour(s))  MRSA PCR Screening     Status: Abnormal   Collection Time: 04/30/17 11:14 PM  Result Value Ref Range Status   MRSA by PCR POSITIVE (A) NEGATIVE Final    Comment:        The GeneXpert MRSA Assay (FDA approved for NASAL specimens only), is one component of a comprehensive MRSA colonization surveillance program. It is not intended to diagnose MRSA infection nor to guide or monitor treatment for MRSA infections. RESULT CALLED TO, READ BACK BY AND VERIFIED WITH: tamara conyers at 0118 on 05/01/17 rww     Coagulation Studies: No results for input(s): LABPROT, INR in the last 72 hours.  Urinalysis: No results for input(s): COLORURINE, LABSPEC, PHURINE, GLUCOSEU, HGBUR, BILIRUBINUR, KETONESUR, PROTEINUR, UROBILINOGEN, NITRITE, LEUKOCYTESUR in the last 72 hours.  Invalid input(s): APPERANCEUR    Imaging: No results found.  Medications:   . sodium chloride     . calcium acetate  1,334 mg Oral TID WC  . carbamazepine  1,000 mg Oral Daily  . Chlorhexidine Gluconate Cloth  6 each Topical Q0600  . cholecalciferol  1,000 Units Oral Daily  . epoetin (EPOGEN/PROCRIT) injection  10,000 Units Intravenous Q M,W,F-HD  . ferrous sulfate  325 mg Oral Daily  . fluPHENAZine  2.5 mg Oral Daily  . fluticasone  2 spray Each Nare Daily  . fluticasone furoate-vilanterol  1 puff Inhalation Daily  . midodrine  10 mg Oral Once in dialysis  . multivitamin  1 tablet Oral QHS  . mupirocin ointment  1 application Nasal BID  . trihexyphenidyl  5 mg Oral TID WC   acetaminophen, baclofen, bisacodyl, cyclobenzaprine, docusate sodium, guaiFENesin-dextromethorphan, oxyCODONE-acetaminophen  Assessment/ Plan:  56 y.o. African-American male with end stage renal disease on hemodialysis,  schizophrenia, hypertension, chronic hepatitis C, history of DVT. Admitted for symptomatic anemia  [facility] Left AVF.  EDW 86 kg  1.  End-stage renal disease 2.  Anemia of chronic kidney disease and possibly blood loss 3.  Secondary hyperparathyroidism  Hemodialysis  planned for tomorrow Patient refused bowel prep; he states he is scared of anesthesia Hemoglobin trended lower today at 7.6 Midodrine and dialysis to avoid hypotension Continue high-dose epogen with dialysis Calcium acetate for phosphorus binding    LOS: 4 [facility] 12/4/201810:43 AM  Dubberly Primrose, [place]

## 2017-05-04 NOTE — Progress Notes (Signed)
Initial Nutrition Assessment  DOCUMENTATION CODES:   Not applicable  INTERVENTION:   Recommend check P levels  Nepro Shake po BID, each supplement provides 425 kcal and 19 grams protein  MVI twice weekly  Renal MVI daily  NUTRITION DIAGNOSIS:   Increased nutrient needs related to catabolic illness(ESRD on HD, COPD, DM) as evidenced by increased estimated needs from protein.  GOAL:   Patient will meet greater than or equal to 90% of their needs  MONITOR:   PO intake, Supplement acceptance, Labs, Weight trends, I & O's  REASON FOR ASSESSMENT:   Diagnosis    ASSESSMENT:   56 year old male with past medical history of end-stage renal disease and hemodialysis, schizophrenia, essential hypertension, previous history of DVT, diabetes, chronic anemia who presents to the hospital due to hemoglobin of 5.5.   Met with pt in room today. Pt reports good appetite and oral intake pta and today. Pt eating breakfast at time of RD visit. There are no documented intakes in chart so RD unsure as to how much pt is consuming as he is a poor historian. Pt denies any trouble chewing or swallowing. Per chart, pt with recent weight gain. Pt does like vanilla supplements; RD will order Nepro. Pt reports he does take a daily MVI at home. Ascorbic acid labs pending. RD discussed the importance of adequate protein intake needed with HD. Recommend check Phosphorus levels. Pt with elevated PTH; on vitamin D supplementation daily.   Medications reviewed and include: Phoslo, vitamin D, epoetin, ferrous sulfate, MVI  Labs reviewed: Cl 98(L), BUN 23(H), creat 3.27(H), Ca 8.6(L) B12- 1483 Hgb 8.0(L), Hct 22.7(L)- 12/3 PTH- 318(H)- 2016  Nutrition-Focused physical exam completed. Findings are severe fat depletion in cheeks, moderate muscle depletions in clavicles, and moderate edema BLE.   Diet Order:  Diet renal/carb modified with fluid restriction Diet-HS Snack? Nothing; Room service appropriate? Yes;  Fluid consistency: Thin  EDUCATION NEEDS:   Education needs have been addressed  Skin:  Reviewed RN Assessment  Last BM:  12/3- type 4  Height:   Ht Readings from Last 1 Encounters:  05/03/17 6' (1.829 m)    Weight:   Wt Readings from Last 1 Encounters:  05/03/17 197 lb 5 oz (89.5 kg)    Ideal Body Weight:  80.9 kg  BMI:  Body mass index is 26.76 kg/m.  Estimated Nutritional Needs:   Kcal:  2100-2400kcal/day   Protein:  107-125g/day   Fluid:  per MD  Koleen Distance MS, RD, LDN Pager #[phone] After Hours Pager: [phone]

## 2017-05-04 NOTE — Care Management (Signed)
Amanda Morris HD liaison notified of admission.  

## 2017-05-04 NOTE — Clinical Social Work Note (Signed)
Clinical Social Work Assessment  Patient Details  Name: Todd Brown MRN: [id number] Date of Birth: 10/07/1960  Date of referral:  05/04/17               Reason for consult:  Discharge Planning                Permission sought to share information with:    Permission granted to share information::     Name::        Agency::     Relationship::     Contact Information:     Housing/Transportation Living arrangements for the past 2 months:    Source of Information:  ([facility] ACT Team Lead: Alicia: [phone]) Patient Interpreter Needed:  None Criminal Activity/Legal Involvement Pertinent to Current Situation/Hospitalization:  No - Comment as needed Significant Relationships:  None Lives with:  Self Do you feel safe going back to the place where you live?    Need for family participation in patient care:  No (Coment)  Care giving concerns:  Patient resides independently in an apartment.   Social Worker assessment / plan:  MD asked CSW to become involved due to concern that patient is refusing to have a test done that if not done, could have grave consequences. MD is not sure that patient is fully understanding why a GI workup needs to be done.   CSW aware that patient has an ACT Team caseworker due to his schizophrenia so CSW contacted the ACT Team Lead: Elmo Putt to see if they could assist with speaking with patient regarding the GI workup. Elmo Putt informed CSW that patient's brother killed their mother about 4 weeks ago and patient has declined since then. Elmo Putt recommended that it might be best to not ask patient if he has any family as patient does not any longer.   Elmo Putt had her nurse: Judeen Hammans: [phone] come to the hospital to speak with patient. Judeen Hammans informed CSW that patient has been compliant with medications and dialysis and does not go to dialysis if he does not feel good. Estill Bamberg, the Dialysis Liason, states that patient is noncompliant with dialysis. Judeen Hammans came to speak  with patient and patient is now agreeable to have the EGD but will not do the colonoscopy. CSW notified MD.   Employment status:  Disabled (Comment on whether or not currently receiving Disability) Insurance information:  Medicaid [place] PT Recommendations:    Information / Referral to community resources:     Patient/Family's Response to care:  ACT team expressed appreciation for CSW assistance.  Patient/Family's Understanding of and Emotional Response to Diagnosis, Current Treatment, and Prognosis:  Patient is scared to have procedures and Judeen Hammans with ACT team assistance with this.  Emotional Assessment Appearance:    Attitude/Demeanor/Rapport:    Affect (typically observed):    Orientation:  Oriented to Self, Oriented to Place, Oriented to  Time, Oriented to Situation Alcohol / Substance use:    Psych involvement (Current and /or in the community):  (not at this time)  Discharge Needs  Concerns to be addressed:  Care Coordination Readmission within the last 30 days:  No Current discharge risk:  None Barriers to Discharge:  Continued Medical Work up   [employer], [place] 05/04/2017, 4:18 PM

## 2017-05-05 ENCOUNTER — Encounter: Payer: Self-pay | Admitting: Anesthesiology

## 2017-05-05 ENCOUNTER — Encounter
Admission: EM | Disposition: A | Discharge status: Home / Self Care | Payer: Self-pay | Source: Home / Self Care | Attending: Specialist

## 2017-05-05 LAB — CBC
HCT: 23.3 % — ABNORMAL LOW (ref 40.0–52.0)
HEMOGLOBIN: 7.9 g/dL — AB (ref 13.0–18.0)
MCH: 34.5 pg — ABNORMAL HIGH (ref 26.0–34.0)
MCHC: 33.8 g/dL (ref 32.0–36.0)
MCV: 101.8 fL — ABNORMAL HIGH (ref 80.0–100.0)
PLATELETS: 164 10*3/uL (ref 150–440)
RBC: 2.29 MIL/uL — AB (ref 4.40–5.90)
RDW: 21.2 % — ABNORMAL HIGH (ref 11.5–14.5)
WBC: 9 10*3/uL (ref 3.8–10.6)

## 2017-05-05 LAB — MISC LABCORP TEST (SEND OUT): LABCORP TEST CODE: 82180

## 2017-05-05 SURGERY — ESOPHAGOGASTRODUODENOSCOPY (EGD) WITH PROPOFOL
Anesthesia: General

## 2017-05-05 MED ORDER — VITAMIN C 500 MG PO TABS
500.0000 mg | ORAL_TABLET | Freq: Two times a day (BID) | ORAL | Status: DC
  Administered 2017-05-05 – 2017-05-07 (×4): 500 mg via ORAL
  Filled 2017-05-05 (×6): qty 1

## 2017-05-05 MED ORDER — VITAMIN C 500 MG PO TABS: 500.0000 mg | ORAL_TABLET | Freq: Every day | ORAL | Status: DC

## 2017-05-05 MED ORDER — MINOXIDIL 10 MG PO TABS
10.0000 mg | ORAL_TABLET | Freq: Every day | ORAL | Status: DC
  Administered 2017-05-05 – 2017-05-06 (×2): 10 mg via ORAL
  Filled 2017-05-05 (×3): qty 1

## 2017-05-05 MED ORDER — HYDRALAZINE HCL 50 MG PO TABS
100.0000 mg | ORAL_TABLET | Freq: Four times a day (QID) | ORAL | Status: DC
  Administered 2017-05-05 – 2017-05-07 (×5): 100 mg via ORAL
  Filled 2017-05-05 (×5): qty 2

## 2017-05-05 NOTE — Progress Notes (Signed)
tx completed

## 2017-05-05 NOTE — Progress Notes (Signed)
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 Will initiate vitamin C 500mg  PO BID x 10 days, then 500mg  po daily.   Koleen Distance MS, RD, LDN Pager #- [phone] After Hours Pager: [phone]

## 2017-05-05 NOTE — Progress Notes (Signed)
[place] [facility] [place] NAME: Todd Brown    MR#:  [id number]  DATE OF BIRTH:  07/31/1960  SUBJECTIVE:   Hemoglobin stable at 7.9 today. No acute bleeding.  Seen at HD and tolerating it well.  Was supposed to get Endoscopy today but it is rescheduled from tomorrow a.m.   REVIEW OF SYSTEMS:    Review of Systems  Constitutional: Negative for chills and fever.  HENT: Negative for congestion and tinnitus.   Eyes: Negative for blurred vision and double vision.  Respiratory: Positive for shortness of breath. Negative for cough and wheezing.   Cardiovascular: Negative for chest pain, orthopnea and PND.  Gastrointestinal: Negative for abdominal pain, diarrhea, nausea and vomiting.  Genitourinary: Negative for dysuria and hematuria.  Neurological: Negative for dizziness, sensory change and focal weakness.  All other systems reviewed and are negative.   Nutrition: Renal w/ Fluid restriction Tolerating Diet: Yes Tolerating PT: Await Eval   DRUG ALLERGIES:   Allergies  Allergen Reactions  . Thorazine [Chlorpromazine] Other (See Comments)    Reaction:  Unknown , pt states it makes him feel real bad    VITALS:  Blood pressure (!) 162/86, pulse (!) 49, temperature 98 F (36.7 C), temperature source Oral, resp. rate 16, height 6' (1.829 m), weight 86 kg (189 lb 9.5 oz), SpO2 100 %.  PHYSICAL EXAMINATION:   Physical Exam  GENERAL:  55 y.o.-year-old patient lying in bed in no acute distress.  EYES: Pupils equal, round, reactive to light and accommodation. No scleral icterus. Extraocular muscles intact.  HEENT: Head atraumatic, normocephalic. Oropharynx and nasopharynx clear.  NECK:  Supple, no jugular venous distention. No thyroid enlargement, no tenderness.  LUNGS: Normal breath sounds bilaterally, no wheezing, rales, rhonchi. No use of accessory muscles of respiration.  CARDIOVASCULAR: S1, S2 normal. No murmurs, rubs, or gallops.  ABDOMEN:  Soft, nontender, nondistended. Bowel sounds present. No organomegaly or mass.  EXTREMITIES: No cyanosis, clubbing or edema b/l.    NEUROLOGIC: Cranial nerves II through XII are intact. No focal Motor or sensory deficits b/l.  Globally weak and difficult to understand PSYCHIATRIC: The patient is alert and oriented x 2.  SKIN: No obvious rash, lesion, or ulcer.   Left upper Ext. AV fistula with good bruit/thrill.   LABORATORY PANEL:   CBC Recent Labs  Lab 05/05/17 0346  WBC 9.0  HGB 7.9*  HCT 23.3*  PLT 164   ------------------------------------------------------------------------------------------------------------------  Chemistries  Recent Labs  Lab 05/01/17 0648  NA 137  K 3.5  CL 98*  CO2 30  GLUCOSE 70  BUN 23*  CREATININE 3.27*  CALCIUM 8.6*   ------------------------------------------------------------------------------------------------------------------  Cardiac Enzymes No results for input(s): TROPONINI in the last 168 hours. ------------------------------------------------------------------------------------------------------------------  RADIOLOGY:  No results found.   ASSESSMENT AND PLAN:   56 year old male with past medical history of end-stage renal disease and hemodialysis, schizophrenia, essential hypertension, previous history of DVT, diabetes, chronic anemia who presents to the hospital due to hemoglobin of 5.5.   1. Acute on chronic symptomatic anemia- pt. Stable at 7.9 today. -Patient was Hemoccult positive and plan was for doing an upper GI endoscopy today but it was canceled and is rescheduled for tomorrow morning. Patient still refuses to have a colonoscopy. Hemoglobin remained stable. Patient will not need to be an anticoagulation as he had an upper extremity DVT in March and has been adequately treated for it.  2. End-stage renal disease on hemodialysis -Continue dialysis on Monday Wednesday and  Friday. Nephro following.  3. Secondary  hyperparathyroidism-continue PhosLo.  4. Previous history of DVT-patient her upper extremity DVT in March of this past year. Patient has a cephalic and basilic DVT. Patient does not need anticoagulation.   5. Essential HTN - cont. Norvasc and resume Hydralazine, Minoxidil.   6. COPD - no acute exacerbation.  - cont. Breo-Ellipta.   7. Hx of Schizophrenia - cont. Tegretol, Prolixin.   Plan for Upper GI Endoscopy tomorrow and possibly d/c post procedure if Hg. Stable.    All the records are reviewed and case discussed with Care Management/Social Worker. Management plans discussed with the patient, family and they are in agreement.  CODE STATUS: Full code  DVT Prophylaxis: Coumadin   TOTAL TIME TAKING CARE OF THIS PATIENT: 30 minutes.   POSSIBLE D/C IN 1-2 DAYS, DEPENDING ON CLINICAL CONDITION.   Henreitta Leber M.D on 05/05/2017 at 1:51 PM  Between 7am to 6pm - Pager - [phone]  After 6pm go to [email or web address] - [facility]  Office  [phone]  CC: Primary care physician; McLean-Scocuzza, Nino Glow, MD [place] at [facility]

## 2017-05-05 NOTE — Progress Notes (Signed)
tx start

## 2017-05-05 NOTE — Progress Notes (Signed)
[facility], [place] 05/05/17  Subjective:   Patient seen during dialysis Tolerating well   HEMODIALYSIS FLOWSHEET:  Blood Flow Rate (mL/min): 400 mL/min Arterial Pressure (mmHg): -140 mmHg Venous Pressure (mmHg): 250 mmHg Transmembrane Pressure (mmHg): 70 mmHg Ultrafiltration Rate (mL/min): 710 mL/min Dialysate Flow Rate (mL/min): 800 ml/min Conductivity: Machine : 13.9 Conductivity: Machine : 13.9 Dialysis Fluid Bolus: Normal Saline Bolus Amount (mL): 250 mL Dialysate Change: (3k)    Objective:  Vital signs in last 24 hours:  Temp:  [98 F (36.7 C)-99.1 F (37.3 C)] 98.1 F (36.7 C) (12/05 1419) Pulse Rate:  [47-74] 54 (12/05 1419) Resp:  [10-20] 12 (12/05 1419) BP: (123-171)/(71-89) 163/89 (12/05 1419) SpO2:  [93 %-100 %] 100 % (12/05 1419) Weight:  [86 kg (189 lb 9.5 oz)-88.1 kg (194 lb 3.6 oz)] 86 kg (189 lb 9.5 oz) (12/05 1246)  Weight change:  Filed Weights   05/03/17 1400 05/05/17 0855 05/05/17 1246  Weight: 89.5 kg (197 lb 5 oz) 88.1 kg (194 lb 3.6 oz) 86 kg (189 lb 9.5 oz)    Intake/Output:    Intake/Output Summary (Last 24 hours) at 05/05/2017 1446 Last data filed at 05/05/2017 1246 Gross per 24 hour  Intake -  Output 2350 ml  Net -2350 ml     Physical Exam: General:  No acute distress, laying in the bed  HEENT  anicteric, moist oral mucous membranes  Neck  supple  Pulm/lungs  normal breathing effort, clear to auscultation  CVS/Heart  no rub or gallop, sinus bradycardia  Abdomen:   Soft, nontender  Extremities:  Trace to 1+ edema  Neurologic:  resting quietly  Skin:  No acute rashes  Access:  Left arm AV fistula       Basic Metabolic Panel:  Recent Labs  Lab 04/30/17 1200 04/30/17 1843 05/01/17 0648  NA  --  137 137  K 4.0 3.1* 3.5  CL  --  97* 98*  CO2  --  31 30  GLUCOSE  --  103* 70  BUN  --  17 23*  CREATININE  --  2.45* 3.27*  CALCIUM  --  8.4* 8.6*     CBC: Recent Labs  Lab  04/30/17 1843 05/01/17 0648 05/02/17 0358 05/02/17 1625 05/03/17 0400 05/04/17 1202 05/05/17 0346  WBC 4.7 6.7 6.1  --  8.9 6.8 9.0  NEUTROABS 3.4  --  4.2  --  6.5  --   --   HGB 5.5* 7.1* 6.5* 7.8* 8.0* 7.6* 7.9*  HCT 16.2* 19.7* 19.0* 22.8* 22.7* 22.6* 23.3*  MCV 110.0* 101.1* 101.2*  --  98.3 101.0* 101.8*  PLT 212 189 170  --  163 161 164      Lab Results  Component Value Date   HEPBSAG Negative 08/07/2016   HEPBSAB Reactive 05/26/2016      Microbiology:  Recent Results (from the past 240 hour(s))  MRSA PCR Screening     Status: Abnormal   Collection Time: 04/30/17 11:14 PM  Result Value Ref Range Status   MRSA by PCR POSITIVE (A) NEGATIVE Final    Comment:        The GeneXpert MRSA Assay (FDA approved for NASAL specimens only), is one component of a comprehensive MRSA colonization surveillance program. It is not intended to diagnose MRSA infection nor to guide or monitor treatment for MRSA infections. RESULT CALLED TO, READ BACK BY AND VERIFIED WITH: tamara conyers at 0118 on 05/01/17 rww     Coagulation Studies: No  results for input(s): LABPROT, INR in the last 72 hours.  Urinalysis: No results for input(s): COLORURINE, LABSPEC, PHURINE, GLUCOSEU, HGBUR, BILIRUBINUR, KETONESUR, PROTEINUR, UROBILINOGEN, NITRITE, LEUKOCYTESUR in the last 72 hours.  Invalid input(s): APPERANCEUR    Imaging: No results found.   Medications:   . sodium chloride     . amLODipine  10 mg Oral Daily  . calcium acetate  1,334 mg Oral TID WC  . carbamazepine  1,000 mg Oral Daily  . Chlorhexidine Gluconate Cloth  6 each Topical Q0600  . cholecalciferol  1,000 Units Oral Daily  . epoetin (EPOGEN/PROCRIT) injection  10,000 Units Intravenous Q M,W,F-HD  . feeding supplement (NEPRO CARB STEADY)  237 mL Oral BID BM  . ferrous sulfate  325 mg Oral Daily  . fluPHENAZine  2.5 mg Oral Daily  . fluticasone  2 spray Each Nare Daily  . fluticasone furoate-vilanterol  1 puff  Inhalation Daily  . hydrALAZINE  100 mg Oral Q6H  . midodrine  10 mg Oral Once in dialysis  . minoxidil  10 mg Oral Daily  . multivitamin  1 tablet Oral QHS  . [START ON 05/06/2017] multivitamin with minerals  1 tablet Oral Once per day on Mon Thu  . mupirocin ointment  1 application Nasal BID  . trihexyphenidyl  5 mg Oral TID WC  . vitamin C  500 mg Oral BID  . [START ON 05/15/2017] vitamin C  500 mg Oral Daily   acetaminophen, baclofen, bisacodyl, cyclobenzaprine, docusate sodium, guaiFENesin-dextromethorphan, oxyCODONE-acetaminophen  Assessment/ Plan:  56 y.o. African-American male with end stage renal disease on hemodialysis, schizophrenia, hypertension, chronic hepatitis C, history of DVT. Admitted for symptomatic anemia  [facility] MWF [place] Left AVF.  EDW 86 kg  1.  End-stage renal disease 2.  Anemia of chronic kidney disease and possibly blood loss 3.  Secondary hyperparathyroidism  Midodrine prior to dialysis to avoid hypotension Continue high-dose epogen with dialysis Calcium acetate for phosphorus binding GI work up in progress   LOS: [place] 12/5/20182:46 PM  [employer], [place]

## 2017-05-05 NOTE — Progress Notes (Signed)
Post Hemodialysis: Tolerated 3.5hours of treatment with 2liters net removal.Scheduled Epogen dose given during treatment.Patient to endoscopy post treatment.

## 2017-05-06 ENCOUNTER — Inpatient Hospital Stay: Payer: Medicaid Other

## 2017-05-06 ENCOUNTER — Inpatient Hospital Stay: Payer: Medicaid Other | Admitting: Anesthesiology

## 2017-05-06 ENCOUNTER — Encounter
Admission: EM | Disposition: A | Discharge status: Home / Self Care | Payer: Self-pay | Source: Home / Self Care | Attending: Specialist

## 2017-05-06 ENCOUNTER — Encounter: Payer: Self-pay | Admitting: Gastroenterology

## 2017-05-06 DIAGNOSIS — K269 Duodenal ulcer, unspecified as acute or chronic, without hemorrhage or perforation: Secondary | ICD-10-CM

## 2017-05-06 DIAGNOSIS — K2970 Gastritis, unspecified, without bleeding: Secondary | ICD-10-CM

## 2017-05-06 HISTORY — PX: ESOPHAGOGASTRODUODENOSCOPY (EGD) WITH PROPOFOL: SHX5813

## 2017-05-06 LAB — RENAL FUNCTION PANEL
Albumin: 2.7 g/dL — ABNORMAL LOW (ref 3.5–5.0)
Anion gap: 11 (ref 5–15)
BUN: 35 mg/dL — ABNORMAL HIGH (ref 6–20)
CALCIUM: 8.3 mg/dL — AB (ref 8.9–10.3)
CO2: 29 mmol/L (ref 22–32)
Chloride: 94 mmol/L — ABNORMAL LOW (ref 101–111)
Creatinine, Ser: 4.66 mg/dL — ABNORMAL HIGH (ref 0.61–1.24)
GFR calc non Af Amer: 13 mL/min — ABNORMAL LOW (ref >60)
GFR, EST AFRICAN AMERICAN: 15 mL/min — AB (ref >60)
Glucose, Bld: 71 mg/dL (ref 65–99)
PHOSPHORUS: 4.6 mg/dL (ref 2.5–4.6)
POTASSIUM: 4 mmol/L (ref 3.5–5.1)
SODIUM: 134 mmol/L — AB (ref 135–145)

## 2017-05-06 LAB — HEMOGLOBIN: Hemoglobin: 8.4 g/dL — ABNORMAL LOW (ref 13.0–18.0)

## 2017-05-06 LAB — METHYLMALONIC ACID, SERUM: Methylmalonic Acid, Quantitative: 520 nmol/L — ABNORMAL HIGH (ref 0–378)

## 2017-05-06 LAB — HEPATITIS B SURFACE ANTIGEN: Hepatitis B Surface Ag: NEGATIVE

## 2017-05-06 LAB — HEPATITIS B SURFACE ANTIBODY, QUANTITATIVE: Hepatitis B-Post: >1000 m[IU]/mL (ref >9.9)

## 2017-05-06 SURGERY — ESOPHAGOGASTRODUODENOSCOPY (EGD) WITH PROPOFOL
Anesthesia: General

## 2017-05-06 MED ORDER — FENTANYL CITRATE (PF) 100 MCG/2ML IJ SOLN
INTRAMUSCULAR | Status: AC
  Filled 2017-05-06: qty 2

## 2017-05-06 MED ORDER — PROPOFOL 500 MG/50ML IV EMUL
INTRAVENOUS | Status: AC
  Filled 2017-05-06: qty 50

## 2017-05-06 MED ORDER — ASCORBIC ACID 500 MG PO TABS: ORAL_TABLET | ORAL | 0 refills | Status: DC

## 2017-05-06 MED ORDER — LIDOCAINE 2% (20 MG/ML) 5 ML SYRINGE
INTRAMUSCULAR | Status: DC | PRN
  Administered 2017-05-06: 40 mg via INTRAVENOUS

## 2017-05-06 MED ORDER — PROPOFOL 500 MG/50ML IV EMUL
INTRAVENOUS | Status: DC | PRN
  Administered 2017-05-06: 140 ug/kg/min via INTRAVENOUS

## 2017-05-06 MED ORDER — PROPOFOL 10 MG/ML IV BOLUS
INTRAVENOUS | Status: DC | PRN
  Administered 2017-05-06: 100 mg via INTRAVENOUS

## 2017-05-06 MED ORDER — OMEPRAZOLE 40 MG PO CPDR: 40.0000 mg | DELAYED_RELEASE_CAPSULE | Freq: Every day | ORAL | 1 refills | Status: DC

## 2017-05-06 MED ORDER — IPRATROPIUM-ALBUTEROL 0.5-2.5 (3) MG/3ML IN SOLN
3.0000 mL | Freq: Four times a day (QID) | RESPIRATORY_TRACT | Status: DC | PRN
  Administered 2017-05-06: 3 mL via RESPIRATORY_TRACT
  Filled 2017-05-06: qty 3

## 2017-05-06 NOTE — Op Note (Signed)
[facility] Patient Name: Todd Brown Procedure Date: 05/06/2017 8:32 AM MRN: [id number] Account #: [id number] Date of Birth: 1960/07/28 Admit Type: Inpatient Age: 56 Room: [facility] ENDO ROOM 3 Gender: Male Note Status: Finalized Procedure:            Upper GI endoscopy Indications:          Heme positive stool Providers:            Jonathon Bellows MD, MD Referring MD:         Nino Glow Mclean-Scocuzza MD, MD (Referring MD) Medicines:            Monitored Anesthesia Care Complications:        No immediate complications. Procedure:            Pre-Anesthesia Assessment:                       - Prior to the procedure, a History and Physical was                        performed, and patient medications, allergies and                        sensitivities were reviewed. The patient's tolerance of                        previous anesthesia was reviewed.                       - The risks and benefits of the procedure and the                        sedation options and risks were discussed with the                        patient. All questions were answered and informed                        consent was obtained.                       - ASA Grade Assessment: III - A patient with severe                        systemic disease.                       After obtaining informed consent, the endoscope was                        passed under direct vision. Throughout the procedure,                        the patient's blood pressure, pulse, and oxygen                        saturations were monitored continuously. The Endoscope                        was introduced through the mouth, and advanced to the  third part of duodenum. The upper GI endoscopy was                        accomplished with ease. The patient tolerated the                        procedure well. Findings:      The esophagus was normal.      Patchy moderate inflammation  characterized by congestion (edema) and       erythema was found in the gastric body. Biopsies were taken with a cold       forceps for histology.      One non-bleeding superficial duodenal ulcer with a flat pigmented spot       (Forrest Class IIc) was found in the duodenal bulb. The lesion was 8 mm       in largest dimension. Impression:           - Normal esophagus.                       - Gastritis. Biopsied.                       - One non-bleeding duodenal ulcer with a flat pigmented                        spot (Forrest Class IIc). Recommendation:       - Return patient to hospital ward for ongoing care.                       - Advance diet as tolerated.                       - Continue present medications.                       - Await pathology results.                       - 1. Patient has still refused a colonoscopy- if he                        changes his mind will be willing to do the procedure                       2. If not willing for a colonoscopy can be discharged                        as his Hb has been stable and no active bleeding noted                       3. Outpatient close monitoring of Hb and if there is                        concern for active bleed then get tagged RBC scan                       4. Check Stool H pylori antigen                       5. No NSAID  use                       I will sign out please call with questions .                       - Discharge on omeprazole 40 mg a day for 6 weeks Procedure Code(s):    --- Professional ---                       [phone], Esophagogastroduodenoscopy, flexible, transoral;                        with biopsy, single or multiple Diagnosis Code(s):    --- Professional ---                       K29.70, Gastritis, unspecified, without bleeding                       K26.9, Duodenal ulcer, unspecified as acute or chronic,                        without hemorrhage or perforation                       R19.5, Other  fecal abnormalities CPT copyright 2016 American Medical Association. All rights reserved. The codes documented in this report are preliminary and upon coder review may  be revised to meet current compliance requirements. Jonathon Bellows, MD Jonathon Bellows MD, MD 05/06/2017 8:47:23 AM This report has been signed electronically. Number of Addenda: 0 Note Initiated On: 05/06/2017 8:32 AM      [name]

## 2017-05-06 NOTE — Transfer of Care (Signed)
Immediate Anesthesia Transfer of Care Note  Patient: Todd Brown  Procedure(s) Performed: ESOPHAGOGASTRODUODENOSCOPY (EGD) WITH PROPOFOL (N/A )  Patient Location: PACU and Endoscopy Unit  Anesthesia Type:General  Level of Consciousness: awake and patient cooperative  Airway & Oxygen Therapy: Patient Spontanous Breathing and Patient connected to nasal cannula oxygen  Post-op Assessment: Report given to RN and Post -op Vital signs reviewed and stable  Post vital signs: Reviewed and stable  Last Vitals:  Vitals:   05/06/17 0713 05/06/17 0840  BP: 118/70   Pulse:    Resp: 18 (P) 18  Temp: (!) 36.1 C (!) (P) 36.1 C  SpO2: 100% (P) 100%    Last Pain:  Vitals:   05/06/17 0840  TempSrc: (P) Tympanic  PainSc:       Patients Stated Pain Goal: 0 (92/44/62 8638)  Complications: No apparent anesthesia complications

## 2017-05-06 NOTE — Anesthesia Preprocedure Evaluation (Signed)
Anesthesia Evaluation  Patient identified by MRN, date of birth, ID band Patient awake    Reviewed: Allergy & Precautions, H&P , NPO status , Patient's Chart, lab work & pertinent test results, reviewed documented beta blocker date and time   Airway Mallampati: II   Neck ROM: full    Dental  (+) Poor Dentition   Pulmonary neg pulmonary ROS, Current Smoker,    Pulmonary exam normal        Cardiovascular hypertension, + Peripheral Vascular Disease  negative cardio ROS Normal cardiovascular exam Rhythm:regular Rate:Normal     Neuro/Psych PSYCHIATRIC DISORDERS negative neurological ROS  negative psych ROS   GI/Hepatic negative GI ROS, Neg liver ROS, (+) Hepatitis -  Endo/Other  negative endocrine ROSdiabetes  Renal/GU Renal diseasenegative Renal ROS  negative genitourinary   Musculoskeletal   Abdominal   Peds  Hematology negative hematology ROS (+) anemia ,   Anesthesia Other Findings Past Medical History: No date: Anemia No date: Diabetes mellitus without complication ([place]) No date: Dialysis patient [facility])     Comment:  Mon. 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 No date: ESRD (end stage renal disease) ([place]) No date: Hyperlipidemia No date: Malignant hypertension No date: Renal artery stenosis (HCC) No date: Schizophrenia [facility]) Past Surgical History: 08/06/2016: A/V FISTULAGRAM; N/A     Comment:  Procedure: A/V Fistulagram;  Surgeon: Algernon Huxley, MD;                Location: [place] [facility];  Service: Cardiovascular;              Laterality: N/A; 08/06/2016: A/V SHUNT INTERVENTION; N/A     Comment:  Procedure: A/V Shunt Intervention;  Surgeon: Algernon Huxley, MD;  Location: [place] [facility];  Service:               Cardiovascular;  Laterality: N/A; 12/26/2014: AV FISTULA PLACEMENT; Left     Comment:  Procedure: ARTERIOVENOUS  (AV) FISTULA CREATION;                Surgeon: Algernon Huxley, MD;  Location: [facility] ORS;  Service:               Vascular;  Laterality: Left; No date: INSERTION OF DIALYSIS CATHETER; Right 10/22/2014: PERIPHERAL VASCULAR CATHETERIZATION; N/A     Comment:  Procedure: Dialysis/Perma Catheter Insertion;  Surgeon:               Algernon Huxley, MD;  Location: [place] [facility];                Service: Cardiovascular;  Laterality: N/A; 02/28/2015: PERIPHERAL VASCULAR CATHETERIZATION; N/A     Comment:  Procedure: Dialysis/Perma Catheter Removal;  Surgeon:               Algernon Huxley, MD;  Location: [place] [facility];                Service: Cardiovascular;  Laterality: N/A; No date: Repair fx left lower leg     Comment:  yrs ago (age 41) BMI    Body Mass Index:  25.71 kg/m     Reproductive/Obstetrics negative OB ROS  Anesthesia Physical Anesthesia Plan  ASA: III  Anesthesia Plan: General   Post-op Pain Management:    Induction:   PONV Risk Score and Plan:   Airway Management Planned:   Additional Equipment:   Intra-op Plan:   Post-operative Plan:   Informed Consent: I have reviewed the patients History and Physical, chart, labs and discussed the procedure including the risks, benefits and alternatives for the proposed anesthesia with the patient or authorized representative who has indicated his/her understanding and acceptance.   Dental Advisory Given  Plan Discussed with: CRNA  Anesthesia Plan Comments:         Anesthesia Quick Evaluation

## 2017-05-06 NOTE — Progress Notes (Signed)
[place], [place] 05/06/17  Subjective:   Patient underwent EGD.  Gastritis and nonbleeding duodenal ulcer was found Patient is doing better No shortness of breath   Objective:  Vital signs in last 24 hours:  Temp:  [96.9 F (36.1 C)-98.6 F (37 C)] 96.9 F (36.1 C) (12/06 0851) Pulse Rate:  [61-73] 62 (12/06 0910) Resp:  [11-18] 12 (12/06 0910) BP: (94-121)/(59-71) 121/71 (12/06 0910) SpO2:  [97 %-100 %] 99 % (12/06 0910)  Weight change:  Filed Weights   05/03/17 1400 05/05/17 0855 05/05/17 1246  Weight: 89.5 kg (197 lb 5 oz) 88.1 kg (194 lb 3.6 oz) 86 kg (189 lb 9.5 oz)    Intake/Output:    Intake/Output Summary (Last 24 hours) at 05/06/2017 1932 Last data filed at 05/06/2017 1835 Gross per 24 hour  Intake 290 ml  Output 250 ml  Net 40 ml     Physical Exam: General:  No acute distress, laying in the bed  HEENT  anicteric, moist oral mucous membranes  Neck  supple  Pulm/lungs  normal breathing effort, clear to auscultation  CVS/Heart  no rub or gallop,   Abdomen:   Soft, nontender  Extremities:  Trace to 1+ edema  Neurologic:  resting quietly  Skin:  No acute rashes  Access:  Left arm AV fistula       Basic Metabolic Panel:  Recent Labs  Lab 04/30/17 1200 04/30/17 1843 05/01/17 0648 05/06/17 0408  NA  --  137 137 134*  K 4.0 3.1* 3.5 4.0  CL  --  97* 98* 94*  CO2  --  31 30 29   GLUCOSE  --  103* 70 71  BUN  --  17 23* 35*  CREATININE  --  2.45* 3.27* 4.66*  CALCIUM  --  8.4* 8.6* 8.3*  PHOS  --   --   --  4.6     CBC: Recent Labs  Lab 04/30/17 1843 05/01/17 0648 05/02/17 0358 05/02/17 1625 05/03/17 0400 05/04/17 1202 05/05/17 0346 05/06/17 0408  WBC 4.7 6.7 6.1  --  8.9 6.8 9.0  --   NEUTROABS 3.4  --  4.2  --  6.5  --   --   --   HGB 5.5* 7.1* 6.5* 7.8* 8.0* 7.6* 7.9* 8.4*  HCT 16.2* 19.7* 19.0* 22.8* 22.7* 22.6* 23.3*  --   MCV 110.0* 101.1* 101.2*  --  98.3 101.0* 101.8*  --   PLT 212 189 170  --   163 161 164  --       Lab Results  Component Value Date   HEPBSAG Negative 05/05/2017   HEPBSAB Reactive 05/26/2016      Microbiology:  Recent Results (from the past 240 hour(s))  MRSA PCR Screening     Status: Abnormal   Collection Time: 04/30/17 11:14 PM  Result Value Ref Range Status   MRSA by PCR POSITIVE (A) NEGATIVE Final    Comment:        The GeneXpert MRSA Assay (FDA approved for NASAL specimens only), is one component of a comprehensive MRSA colonization surveillance program. It is not intended to diagnose MRSA infection nor to guide or monitor treatment for MRSA infections. RESULT CALLED TO, READ BACK BY AND VERIFIED WITH: tamara conyers at 0118 on 05/01/17 rww     Coagulation Studies: No results for input(s): LABPROT, INR in the last 72 hours.  Urinalysis: No results for input(s): COLORURINE, LABSPEC, [place], [place], [place], [place], [place], [place], [place], NITRITE, LEUKOCYTESUR  in the last 72 hours.  Invalid input(s): APPERANCEUR    Imaging: Dg Chest Port 1 View  Result Date: 05/06/2017 CLINICAL DATA:  Shortness of breath. EXAM: PORTABLE CHEST 1 VIEW COMPARISON:  Radiographs of April 30, 2017. FINDINGS: Stable cardiomediastinal silhouette. No pneumothorax is noted. Increased bibasilar opacities are noted concerning for pulmonary edema or infiltrate with minimal associated pleural effusions. Bony thorax is unremarkable. IMPRESSION: Increased bibasilar opacities are noted most consistent with pulmonary edema or possibly infiltrates with minimal bilateral associated pleural effusions. Electronically Signed   By: Marijo Conception, M.D.   On: 05/06/2017 14:14     Medications:   . sodium chloride     . amLODipine  10 mg Oral Daily  . calcium acetate  1,334 mg Oral TID WC  . carbamazepine  1,000 mg Oral Daily  . cholecalciferol  1,000 Units Oral Daily  . epoetin (EPOGEN/PROCRIT) injection  10,000 Units Intravenous Q M,W,F-HD  .  feeding supplement (NEPRO CARB STEADY)  237 mL Oral BID BM  . ferrous sulfate  325 mg Oral Daily  . fluPHENAZine  2.5 mg Oral Daily  . fluticasone  2 spray Each Nare Daily  . fluticasone furoate-vilanterol  1 puff Inhalation Daily  . hydrALAZINE  100 mg Oral Q6H  . midodrine  10 mg Oral Once in dialysis  . minoxidil  10 mg Oral Daily  . multivitamin  1 tablet Oral QHS  . multivitamin with minerals  1 tablet Oral Once per day on Mon Thu  . trihexyphenidyl  5 mg Oral TID WC  . vitamin C  500 mg Oral BID  . [START ON 05/15/2017] vitamin C  500 mg Oral Daily   acetaminophen, baclofen, bisacodyl, cyclobenzaprine, docusate sodium, guaiFENesin-dextromethorphan, ipratropium-albuterol, oxyCODONE-acetaminophen  Assessment/ Plan:  56 y.o. African-American male with end stage renal disease on hemodialysis, schizophrenia, hypertension, chronic hepatitis C, history of DVT. Admitted for symptomatic anemia  [facility] MWF [place] Left AVF.  EDW 86 kg  1.  End-stage renal disease 2.  Anemia of chronic kidney disease and possibly blood loss 3.  Secondary hyperparathyroidism  Midodrine prior to dialysis to avoid hypotension Continue high-dose epogen with dialysis Calcium acetate for phosphorus binding GI work up in progress Dialysis planned for tomorrow   LOS: [place] 12/6/20187:32 [place], [place]

## 2017-05-06 NOTE — Progress Notes (Signed)
SATURATION QUALIFICATIONS: (This note is used to comply with regulatory documentation for home oxygen)  Patient Saturations on Room Air at Rest = 83%  Patient Saturations on Room Air while Ambulating = 79%  Patient Saturations on 3 Liters of oxygen while Ambulating = 93%  Please briefly explain why patient needs home oxygen:   

## 2017-05-06 NOTE — Anesthesia Postprocedure Evaluation (Signed)
Anesthesia Post Note  Patient: Todd Brown  Procedure(s) Performed: ESOPHAGOGASTRODUODENOSCOPY (EGD) WITH PROPOFOL (N/A )  Patient location during evaluation: PACU Anesthesia Type: General Level of consciousness: awake and alert Pain management: pain level controlled Vital Signs Assessment: post-procedure vital signs reviewed and stable Respiratory status: spontaneous breathing, nonlabored ventilation, respiratory function stable and patient connected to nasal cannula oxygen Cardiovascular status: blood pressure returned to baseline and stable Postop Assessment: no apparent nausea or vomiting Anesthetic complications: no     Last Vitals:  Vitals:   05/06/17 0900 05/06/17 0910  BP: (!) 106/59 121/71  Pulse: 73 62  Resp: 15 12  Temp:    SpO2: 100% 99%    Last Pain:  Vitals:   05/06/17 0840  TempSrc: Tympanic  PainSc:                  Molli Barrows

## 2017-05-06 NOTE — Care Management Note (Signed)
Case Management Note  Patient Details  Name: Todd Brown MRN: [id number] Date of Birth: December 01, 1960  Patient to discharge home today. Patient with qualifying O2 saturations for home O2.  Referral Made to [facility] with [place]. Order placed for Home Health RN. Patient states he does not have a preference of agency.  Referral made to Sarah with [facility].  Bedside RN Minette Brine has discussed discharge disposition with ACT team. Per ACT team they will be following up with the patient daily to ensure he is accurately taking his medication. RNCM signing off.    Subjective/Objective:                    Action/Plan:   Expected Discharge Date:  05/06/17               Expected Discharge Plan:  [place]  In-House Referral:     Discharge planning Services  CM Consult  Post Acute Care Choice:  Durable Medical Equipment, Home Health Choice offered to:  Patient  DME Arranged:  Oxygen DME Agency:  [place] Arranged:  RN [facility] Agency:  [facility]  Status of Service:  Completed, signed off  If discussed at [place] of Stay Meetings, dates discussed:    Additional Comments:  Beverly Sessions, RN 05/06/2017, 2:14 PM

## 2017-05-06 NOTE — Progress Notes (Signed)
[name] at [place] NAME: Todd Brown    MR#:  [id number]  DATE OF BIRTH:  1960/07/28  SUBJECTIVE:   Hemoglobin stable at 8.4 today. No acute bleeding.  S/p Endoscopy showing gastritis, non-bleeding duodenal ulcer. Noted to be hypoxic and qualified for home oxygen and previously has not been on oxygen. Chest x-ray obtained showing pulmonary edema with vascular congestion.  REVIEW OF SYSTEMS:    Review of Systems  Constitutional: Negative for chills and fever.  HENT: Negative for congestion and tinnitus.   Eyes: Negative for blurred vision and double vision.  Respiratory: Positive for shortness of breath. Negative for cough and wheezing.   Cardiovascular: Negative for chest pain, orthopnea and PND.  Gastrointestinal: Negative for abdominal pain, diarrhea, nausea and vomiting.  Genitourinary: Negative for dysuria and hematuria.  Neurological: Negative for dizziness, sensory change and focal weakness.  All other systems reviewed and are negative.   Nutrition: Renal w/ Fluid restriction Tolerating Diet: Yes Tolerating PT: Eval noted.    DRUG ALLERGIES:   Allergies  Allergen Reactions  . Thorazine [Chlorpromazine] Other (See Comments)    Reaction:  Unknown , pt states it makes him feel real bad    VITALS:  Blood pressure 121/71, pulse 62, temperature (!) 96.9 F (36.1 C), resp. rate 12, height 6' (1.829 m), weight 86 kg (189 lb 9.5 oz), SpO2 99 %.  PHYSICAL EXAMINATION:   Physical Exam  GENERAL:  56 y.o.-year-old patient lying in bed in no acute distress.  EYES: Pupils equal, round, reactive to light and accommodation. No scleral icterus. Extraocular muscles intact.  HEENT: Head atraumatic, normocephalic. Oropharynx and nasopharynx clear.  NECK:  Supple, no jugular venous distention. No thyroid enlargement, no tenderness.  LUNGS: Normal breath sounds bilaterally, no wheezing, bibasilar rales, rhonchi. No use of accessory  muscles of respiration.  CARDIOVASCULAR: S1, S2 normal. No murmurs, rubs, or gallops.  ABDOMEN: Soft, nontender, nondistended. Bowel sounds present. No organomegaly or mass.  EXTREMITIES: No cyanosis, clubbing or edema b/l.    NEUROLOGIC: Cranial nerves II through XII are intact. No focal Motor or sensory deficits b/l.  Globally weak and difficult to understand PSYCHIATRIC: The patient is alert and oriented x 2.  SKIN: No obvious rash, lesion, or ulcer.   Left upper Ext. AV fistula with good bruit/thrill.   LABORATORY PANEL:   CBC Recent Labs  Lab 05/05/17 0346 05/06/17 0408  WBC 9.0  --   HGB 7.9* 8.4*  HCT 23.3*  --   PLT 164  --    ------------------------------------------------------------------------------------------------------------------  Chemistries  Recent Labs  Lab 05/06/17 0408  NA 134*  K 4.0  CL 94*  CO2 29  GLUCOSE 71  BUN 35*  CREATININE 4.66*  CALCIUM 8.3*   ------------------------------------------------------------------------------------------------------------------  Cardiac Enzymes No results for input(s): TROPONINI in the last 168 hours. ------------------------------------------------------------------------------------------------------------------  RADIOLOGY:  Dg Chest Port 1 View  Result Date: 05/06/2017 CLINICAL DATA:  Shortness of breath. EXAM: PORTABLE CHEST 1 VIEW COMPARISON:  Radiographs of April 30, 2017. FINDINGS: Stable cardiomediastinal silhouette. No pneumothorax is noted. Increased bibasilar opacities are noted concerning for pulmonary edema or infiltrate with minimal associated pleural effusions. Bony thorax is unremarkable. IMPRESSION: Increased bibasilar opacities are noted most consistent with pulmonary edema or possibly infiltrates with minimal bilateral associated pleural effusions. Electronically Signed   By: Marijo Conception, M.D.   On: 05/06/2017 14:14     ASSESSMENT AND PLAN:   56 year old male with past medical  history  of end-stage renal disease and hemodialysis, schizophrenia, essential hypertension, previous history of DVT, diabetes, chronic anemia who presents to the hospital due to hemoglobin of 5.5.   1. Acute on chronic symptomatic anemia- pt. Stable at 8.4 today. Acute bleeding overnight. -Status post upper GI endoscopy today showing gastritis with nonbleeding duodenal ulcer.  -No further need for transfusion. Patient does not need anticoagulation now. Continue supportive care.  2. Acute respiratory failure with hypoxia-patient noted to be hypoxic on minimal ambulation and even at rest today. Patient has no previous history of being on oxygen. Chest x-ray obtained today showing pulmonary vascular congestion with pulmonary edema. -Discussed with nephrology and plan for hemodialysis tomorrow with extra fluid to be removed. Patient also likely has underlying COPD for which he will need oxygen but will attempt to get more fluid off tomorrow to see if he can be weaned off the oxygen.  3. End-stage renal disease on hemodialysis -Continue dialysis on Monday Wednesday and Friday. Nephro following.  4. Secondary hyperparathyroidism-continue PhosLo.  5. Previous history of DVT-patient her upper extremity DVT in March of this past year. Patient has a cephalic and basilic DVT. Patient does not need anticoagulation.   6. Essential HTN - cont. Norvasc and resume Hydralazine, Minoxidil.   7. COPD - no acute exacerbation.  - cont. Breo-Ellipta.   8. Hx of Schizophrenia - cont. Tegretol, Prolixin.   Possible d/c home tomorrow.   All the records are reviewed and case discussed with Care Management/Social Worker. Management plans discussed with the patient, family and they are in agreement.  CODE STATUS: Full code  DVT Prophylaxis: Coumadin   TOTAL TIME TAKING CARE OF THIS PATIENT: 30 minutes.   POSSIBLE D/C IN 1-2 DAYS, DEPENDING ON CLINICAL CONDITION.   Todd Brown M.D on 05/06/2017 at 3:19  PM  Between 7am to 6pm - Pager - [phone]  After 6pm go to [email or web address] - [place] [facility]  Office  [phone]  CC: Primary care physician; McLean-Scocuzza, Nino Glow, MD [place] at [facility]

## 2017-05-06 NOTE — Discharge Summary (Addendum)
[place] at [place] NAME: Todd Brown    MR#:  [id number]  DATE OF BIRTH:  May 07, 1961  DATE OF ADMISSION:  04/30/2017 ADMITTING PHYSICIAN: Vaughan Basta, MD  DATE OF DISCHARGE: 05/06/2017  PRIMARY CARE PHYSICIAN: McLean-Scocuzza, Nino Glow, MD    ADMISSION DIAGNOSIS:  Symptomatic anemia [D64.9]  DISCHARGE DIAGNOSIS:  Principal Problem:   Symptomatic anemia   SECONDARY DIAGNOSIS:   Past Medical History:  Diagnosis Date  . Anemia   . Diabetes mellitus without complication ([place])   . Dialysis patient [facility])    Mon. -Wed.- Fri  . 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 ESRD (end stage renal disease) ([place])   . Hyperlipidemia   . Malignant hypertension   . Renal artery stenosis ([place])   . Schizophrenia [facility])     HOSPITAL COURSE:   56 year old male with past medical history of end-stage renal disease and hemodialysis, schizophrenia, essential hypertension, previous history of DVT, diabetes, chronic anemia who presents to the hospital due to hemoglobin of 5.5.  1. Acute on chronic symptomatic anemia- patient was admitted to the hospital with hemoglobin of 5.5. He was transfused 2 units of packed red blood cells and his hemoglobin has remained stable since then. It's currently stable on the day of discharge 8.4. He was Hemoccult positive and plan was to do an upper GI endoscopy and colonoscopy but initially patient refused, he wasn't convinced to have at least an upper GI endoscopy done.  -Patient underwent a upper GI endoscopy on 05/06/2017 which showed gastritis with a nonbleeding duodenal ulcer. There was no stigmata of acute bleeding. Patient is being discharged on oral omeprazole. He would benefit from a colonoscopy but he presently refuses that. -Patient currently is not being discharged on any anticoagulation as he was on Coumadin prior to admission for an upper extremity DVT and has been  adequately treated for it.  2. End-stage renal disease on hemodialysis -Patient was seen by nephrology and maintained on his dialysis schedule Monday Wednesday Friday which she'll resume upon discharge.  3. Secondary hyperparathyroidism- he will continue PhosLo.  4. Previous history of DVT-patient had an upper extremity DVT in March of this past year. Patient has a cephalic and basilic DVT. Patient does not need anticoagulation and therefore has been taken off his Coumadin for now.   5. Essential HTN -  his blood pressure was a little low side in the hospital and initially his antihypertensives were held. These were slowly reintroduced and during his last hemodialysis session his blood pressure remained on the lower side. After further discussion with nephrology. Patient has been taken off the minoxidil, his hydralazine, clonidine, Norvasc has been lowered and adjusted. -he was given new prescriptions for his antihypertensive medications.  6. COPD - patient has a long history of tobacco abuse. He did not have any acute COPD exacerbation. He was given DuoNeb's, maintained on his inhalers but remained hypoxic and qualified for home oxygen. -He is being discharged on oxygen and maintenance of his inhalers presently.  7. Hx of Schizophrenia - he will cont. Tegretol, Prolixin.   8. Vit C deficiency - pt. Was noted to have Vit C efficiency as per dietary. He is being discharged on vitamin C supplements.  Patient is being discharged home with home health nursing services.  DISCHARGE CONDITIONS:   Stable  CONSULTS OBTAINED:  Treatment Team:  Lavonia Dana, MD Lequita Asal, MD  DRUG ALLERGIES:  Allergies  Allergen Reactions  . Thorazine [Chlorpromazine] Other (See Comments)    Reaction:  Unknown , pt states it makes him feel real bad    DISCHARGE MEDICATIONS:   Allergies as of 05/06/2017      Reactions   Thorazine [chlorpromazine] Other (See Comments)   Reaction:   Unknown , pt states it makes him feel real bad      Medication List    STOP taking these medications   warfarin 10 MG tablet Commonly known as:  COUMADIN     TAKE these medications   acetaminophen 500 MG tablet Commonly known as:  TYLENOL Take 500 mg by mouth every 4 (four) hours as needed for mild pain or moderate pain.   amLODipine 10 MG tablet Commonly known as:  NORVASC Take 10 mg by mouth daily.   ascorbic acid 500 MG tablet Commonly known as:  VITAMIN C 1 tab PO BID X 9 days and then start 1 tab daily.   calcium acetate 667 MG capsule Commonly known as:  PHOSLO Take 2 capsules (1,334 mg total) by mouth 3 (three) times daily with meals.   carbamazepine 200 MG 12 hr tablet Commonly known as:  TEGRETOL XR Take 1,000 mg by mouth every morning.   cholecalciferol 1000 units tablet Commonly known as:  VITAMIN D Take 1,000 Units by mouth daily.   cloNIDine 0.3 MG tablet Commonly known as:  CATAPRES Take 1 tablet (0.3 mg total) by mouth 3 (three) times daily.   ferrous sulfate 325 (65 FE) MG tablet Take 1 tablet (325 mg total) by mouth daily.   fluPHENAZine 2.5 MG tablet Commonly known as:  PROLIXIN Take 2.5 mg by mouth 2 (two) times daily.   Fluticasone-Salmeterol 500-50 MCG/DOSE Aepb Commonly known as:  ADVAIR Inhale 1 puff into the lungs 2 (two) times daily.   hydrALAZINE 100 MG tablet Commonly known as:  APRESOLINE Take 1 tablet (100 mg total) by mouth every 6 (six) hours.   minoxidil 10 MG tablet Commonly known as:  LONITEN Take 1 tablet (10 mg total) by mouth daily.   multivitamin Tabs tablet Take 1 tablet by mouth daily.   omeprazole 40 MG capsule Commonly known as:  PRILOSEC Take 1 capsule (40 mg total) by mouth daily.   PROAIR HFA 108 (90 Base) MCG/ACT inhaler Generic drug:  albuterol Inhale 1-2 puffs into the lungs every 4 (four) hours as needed for wheezing or shortness of breath.   traMADol 50 MG tablet Commonly known as:  ULTRAM Take 50  mg by mouth 2 (two) times daily.   trihexyphenidyl 2 MG tablet Commonly known as:  ARTANE Take 2 mg by mouth 2 (two) times daily.            Durable Medical Equipment  (From admission, onward)        Start     Ordered   05/06/17 1355  For home use only DME oxygen  Once    Question Answer Comment  Mode or (Route) Nasal cannula   Liters per Minute 3   Frequency Continuous (stationary and portable oxygen unit needed)   Oxygen conserving device Yes   Oxygen delivery system Gas      05/06/17 1354        DISCHARGE INSTRUCTIONS:   DIET:  Cardiac diet and Renal diet  DISCHARGE CONDITION:  Stable  ACTIVITY:  Activity as tolerated  OXYGEN:  Home Oxygen: Yes.     Oxygen Delivery: 3 liters/min via Patient connected to nasal  cannula oxygen  DISCHARGE LOCATION:  Home with Home Health Nursing.    If you experience worsening of your admission symptoms, develop shortness of breath, life threatening emergency, suicidal or homicidal thoughts you must seek medical attention immediately by calling 911 or calling your MD immediately  if symptoms less severe.  You Must read complete instructions/literature along with all the possible adverse reactions/side effects for all the Medicines you take and that have been prescribed to you. Take any new Medicines after you have completely understood and accpet all the possible adverse reactions/side effects.   Please note  You were cared for by a hospitalist during your hospital stay. If you have any questions about your discharge medications or the care you received while you were in the hospital after you are discharged, you can call the unit and asked to speak with the hospitalist on call if the hospitalist that took care of you is not available. Once you are discharged, your primary care physician will handle any further medical issues. Please note that NO REFILLS for any discharge medications will be authorized once you are discharged,  as it is imperative that you return to your primary care physician (or establish a relationship with a primary care physician if you do not have one) for your aftercare needs so that they can reassess your need for medications and monitor your lab values.     Today   Tolerated HD well.  No other complaints. No bleeding overnight and will d/c home today.   VITAL SIGNS:  Blood pressure 121/71, pulse 62, temperature (!) 96.9 F (36.1 C), resp. rate 12, height 6' (1.829 m), weight 86 kg (189 lb 9.5 oz), SpO2 99 %.  I/O:    Intake/Output Summary (Last 24 hours) at 05/06/2017 1355 Last data filed at 05/06/2017 1000 Gross per 24 hour  Intake 290 ml  Output 175 ml  Net 115 ml    PHYSICAL EXAMINATION:   GENERAL:  56 y.o.-year-old patient lying in bed in no acute distress.  EYES: Pupils equal, round, reactive to light and accommodation. No scleral icterus. Extraocular muscles intact.  HEENT: Head atraumatic, normocephalic. Oropharynx and nasopharynx clear.  NECK:  Supple, no jugular venous distention. No thyroid enlargement, no tenderness.  LUNGS: Normal breath sounds bilaterally, no wheezing, rales, rhonchi. No use of accessory muscles of respiration.  CARDIOVASCULAR: S1, S2 normal. No murmurs, rubs, or gallops.  ABDOMEN: Soft, nontender, nondistended. Bowel sounds present. No organomegaly or mass.  EXTREMITIES: No cyanosis, clubbing or edema b/l.    NEUROLOGIC: Cranial nerves II through XII are intact. No focal Motor or sensory deficits b/l. PSYCHIATRIC: The patient is alert and oriented x 3.  SKIN: No obvious rash, lesion, or ulcer.   Left upper Ext. AV fistula with good bruit/thrill.     DATA REVIEW:   CBC Recent Labs  Lab 05/05/17 0346 05/06/17 0408  WBC 9.0  --   HGB 7.9* 8.4*  HCT 23.3*  --   PLT 164  --     Chemistries  Recent Labs  Lab 05/06/17 0408  NA 134*  K 4.0  CL 94*  CO2 29  GLUCOSE 71  BUN 35*  CREATININE 4.66*  CALCIUM 8.3*    Cardiac  Enzymes No results for input(s): TROPONINI in the last 168 hours.  Microbiology Results  Results for orders placed or performed during the hospital encounter of 04/30/17  MRSA PCR Screening     Status: Abnormal   Collection Time: 04/30/17 11:14 PM  Result Value Ref Range Status   MRSA by PCR POSITIVE (A) NEGATIVE Final    Comment:        The GeneXpert MRSA Assay (FDA approved for NASAL specimens only), is one component of a comprehensive MRSA colonization surveillance program. It is not intended to diagnose MRSA infection nor to guide or monitor treatment for MRSA infections. RESULT CALLED TO, READ BACK BY AND VERIFIED WITH: tamara conyers at 0118 on 05/01/17 rww     RADIOLOGY:  No results found.    Management plans discussed with the patient, family and they are in agreement.  CODE STATUS:     Code Status Orders  (From admission, onward)        Start     Ordered   04/30/17 2217  Full code  Continuous     04/30/17 2216    TOTAL TIME TAKING CARE OF THIS PATIENT: 40 minutes.    Henreitta Leber M.D on 05/06/2017 at 1:55 PM  Between 7am to 6pm - Pager - [phone]  After 6pm go to [email or web address] - [employer]  [employer] [place] [facility]  Office  [phone]  CC: Primary care physician; McLean-Scocuzza, Nino Glow, MD

## 2017-05-06 NOTE — H&P (Signed)
Jonathon Bellows, MD [address], [place], [place], [place], [address] [address], [place], [place], [place], [address] Phone: [phone]  Fax: [phone]  Primary Care Physician:  McLean-Scocuzza, Nino Glow, MD   Pre-Procedure History & Physical: HPI:  MAC DOWDELL is a 56 y.o. male is here for an endoscopy    Past Medical History:  Diagnosis Date  . Anemia   . Diabetes mellitus without complication ([place])   . Dialysis patient [facility])    Mon. -Wed.- Fri  . DVT (deep venous thrombosis) ([place])    cephalic and basolic vein thrombosis  . ESRD (end stage renal disease) ([place])   . Hyperlipidemia   . Malignant hypertension   . Renal artery stenosis ([place])   . Schizophrenia [facility])     Past Surgical History:  Procedure Laterality Date  . A/V FISTULAGRAM N/A 08/06/2016   Procedure: A/V Fistulagram;  Surgeon: Algernon Huxley, MD;  Location: [place] CV [facility];  Service: Cardiovascular;  Laterality: N/A;  . A/V SHUNT INTERVENTION N/A 08/06/2016   Procedure: A/V Shunt Intervention;  Surgeon: Algernon Huxley, MD;  Location: [place] [facility];  Service: Cardiovascular;  Laterality: N/A;  . AV FISTULA PLACEMENT Left 12/26/2014   Procedure: ARTERIOVENOUS (AV) FISTULA CREATION;  Surgeon: Algernon Huxley, MD;  Location: [facility] ORS;  Service: Vascular;  Laterality: Left;  . INSERTION OF DIALYSIS CATHETER Right   . PERIPHERAL VASCULAR CATHETERIZATION N/A 10/22/2014   Procedure: Dialysis/Perma Catheter Insertion;  Surgeon: Algernon Huxley, MD;  Location: [place] [facility];  Service: Cardiovascular;  Laterality: N/A;  . PERIPHERAL VASCULAR CATHETERIZATION N/A 02/28/2015   Procedure: Dialysis/Perma Catheter Removal;  Surgeon: Algernon Huxley, MD;  Location: [place] [facility];  Service: Cardiovascular;  Laterality: N/A;  . Repair fx left lower leg     yrs ago (age 53)    Prior to Admission medications   Medication Sig Start Date End Date Taking? Authorizing Provider  acetaminophen (TYLENOL) 500 MG tablet  Take 500 mg by mouth every 4 (four) hours as needed for mild pain or moderate pain.    Yes [provider]  albuterol (PROAIR HFA) 108 (90 Base) MCG/ACT inhaler Inhale 1-2 puffs into the lungs every 4 (four) hours as needed for wheezing or shortness of breath.    Yes [provider]  amLODipine (NORVASC) 10 MG tablet Take 10 mg by mouth daily.   Yes [provider]  calcium acetate (PHOSLO) 667 MG capsule Take 2 capsules (1,334 mg total) by mouth 3 (three) times daily with meals. 05/19/15  Yes Fritzi Mandes, MD  carbamazepine (TEGRETOL XR) 200 MG 12 hr tablet Take 1,000 mg by mouth every morning.   Yes [provider]  cholecalciferol (VITAMIN D) 1000 UNITS tablet Take 1,000 Units by mouth daily.   Yes [provider]  cloNIDine (CATAPRES) 0.3 MG tablet Take 1 tablet (0.3 mg total) by mouth 3 (three) times daily. 08/17/16  Yes Gladstone Lighter, MD  ferrous sulfate 325 (65 FE) MG tablet Take 1 tablet (325 mg total) by mouth daily. 05/19/15  Yes Fritzi Mandes, MD  fluPHENAZine (PROLIXIN) 2.5 MG tablet Take 2.5 mg by mouth 2 (two) times daily.    Yes [provider]  Fluticasone-Salmeterol (ADVAIR) 500-50 MCG/DOSE AEPB Inhale 1 puff into the lungs 2 (two) times daily.   Yes [provider]  hydrALAZINE (APRESOLINE) 100 MG tablet Take 1 tablet (100 mg total) by mouth every 6 (six) hours. 08/17/16  Yes Gladstone Lighter, MD  minoxidil (LONITEN) 10 MG tablet Take 1 tablet (10 mg total) by mouth daily. 08/18/16  Yes Gladstone Lighter, MD  multivitamin (RENA-VIT) TABS tablet Take 1 tablet by mouth daily.   Yes [provider]  traMADol (ULTRAM) 50 MG tablet Take 50 mg by mouth 2 (two) times daily.   Yes [provider]  trihexyphenidyl (ARTANE) 2 MG tablet Take 2 mg by mouth 2 (two) times daily.   Yes [provider]  warfarin (COUMADIN) 10 MG tablet Take 1 tablet (10 mg total) by mouth daily. 01/26/17  Yes Lloyd Huger, MD    Allergies as of 04/30/2017 - Review Complete 04/30/2017  Allergen Reaction Noted  . Thorazine [chlorpromazine] Other (See Comments) 03/23/2013    Family History  Problem Relation Age of Onset  . Diabetes Neg Hx     Social History   Socioeconomic History  . Marital status: Single    Spouse name: Not on file  . Number of children: Not on file  . Years of education: Not on file  . Highest education level: Not on file  Social Needs  . Financial resource strain: Not on file  . Food insecurity - worry: Not on file  . Food insecurity - inability: Not on file  . Transportation needs - medical: Not on file  . Transportation needs - non-medical: Not on file  Occupational History  . Not on file  Tobacco Use  . Smoking status: Current Every Day Smoker    Packs/day: 0.50    Types: Cigarettes  . Smokeless tobacco: Never Used  . Tobacco comment: 10 cigars a day  Substance and Sexual Activity  . Alcohol use: No  . Drug use: No  . Sexual activity: Not on file  Other Topics Concern  . Not on file  Social History Narrative  . Not on file    Review of Systems: See HPI, otherwise negative ROS  Physical Exam: BP 118/70   Pulse 61   Temp (!) 96.9 F (36.1 C) (Tympanic)   Resp 18   Ht 6' (1.829 m)   Wt 189 lb 9.5 oz (86 kg)   SpO2 100%   BMI 25.71 kg/m  General:   Alert,  pleasant and cooperative in NAD Head:  Normocephalic and atraumatic. Neck:  Supple; no masses or thyromegaly. Lungs:  Clear throughout to auscultation, normal respiratory effort.    Heart:  +S1, +S2, Regular rate and rhythm, No edema. Abdomen:  Soft, nontender and nondistended. Normal bowel sounds, without guarding, and without rebound.   Neurologic:  Alert and  oriented x4;  grossly normal neurologically.  Impression/Plan: Todd Brown is here for an endoscopy  to be performed for  evaluation of GI bleed    Risks, benefits, limitations, and alternatives regarding endoscopy have been  reviewed with the patient.  Questions have been answered.  All parties agreeable.   Jonathon Bellows, MD  05/06/2017, 8:07 AM

## 2017-05-06 NOTE — Anesthesia Post-op Follow-up Note (Signed)
Anesthesia QCDR form completed.        

## 2017-05-07 LAB — SURGICAL PATHOLOGY

## 2017-05-07 MED ORDER — HYDRALAZINE HCL 100 MG PO TABS: 50.0000 mg | ORAL_TABLET | Freq: Three times a day (TID) | ORAL | 1 refills | Status: DC

## 2017-05-07 MED ORDER — AMLODIPINE BESYLATE 10 MG PO TABS: 5.0000 mg | ORAL_TABLET | Freq: Every day | ORAL | 1 refills | Status: DC

## 2017-05-07 MED ORDER — CLONIDINE HCL 0.1 MG PO TABS: 0.1000 mg | ORAL_TABLET | Freq: Two times a day (BID) | ORAL | 1 refills | Status: DC

## 2017-05-07 NOTE — Care Management (Signed)
HD liaison and Judson Roch with [facility] notified of discharge.

## 2017-05-07 NOTE — Progress Notes (Signed)
Post HD assessment  

## 2017-05-07 NOTE — Progress Notes (Signed)
Pre HD assessment  

## 2017-05-07 NOTE — Progress Notes (Signed)
[place], [place] 05/07/17  Subjective:   Patient underwent EGD.  Gastritis and nonbleeding duodenal ulcer was found Patient is doing better No shortness of breath   HEMODIALYSIS FLOWSHEET:  Blood Flow Rate (mL/min): 400 mL/min Arterial Pressure (mmHg): -180 mmHg Venous Pressure (mmHg): 210 mmHg Transmembrane Pressure (mmHg): 60 mmHg Ultrafiltration Rate (mL/min): 310 mL/min Dialysate Flow Rate (mL/min): 800 ml/min Conductivity: Machine : 14.2 Conductivity: Machine : 14.2 Dialysis Fluid Bolus: Normal Saline Bolus Amount (mL): 250 mL Dialysate Change: (3k)     Objective:  Vital signs in last 24 hours:  Temp:  [98.2 F (36.8 C)-98.7 F (37.1 C)] 98.7 F (37.1 C) (12/07 1110) Pulse Rate:  [65-84] 70 (12/07 1547) Resp:  [10-17] 11 (12/07 1547) BP: (91-124)/(49-70) 103/50 (12/07 1547) SpO2:  [96 %-100 %] 100 % (12/07 1547)  Weight change:  Filed Weights   05/05/17 0855 05/05/17 1246  Weight: 88.1 kg (194 lb 3.6 oz) 86 kg (189 lb 9.5 oz)    Intake/Output:    Intake/Output Summary (Last 24 hours) at 05/07/2017 1624 Last data filed at 05/07/2017 1547 Gross per 24 hour  Intake 120 ml  Output 2895 ml  Net -2775 ml     Physical Exam: General:  No acute distress, laying in the bed  HEENT  anicteric, moist oral mucous membranes  Neck  supple  Pulm/lungs  normal breathing effort, clear to auscultation  CVS/Heart  no rub or gallop,   Abdomen:   Soft, nontender  Extremities:  Trace to 1+ edema  Neurologic:  resting quietly  Skin:  No acute rashes  Access:  Left arm AV fistula       Basic Metabolic Panel:  Recent Labs  Lab 04/30/17 1843 05/01/17 0648 05/06/17 0408  NA 137 137 134*  K 3.1* 3.5 4.0  CL 97* 98* 94*  CO2 31 30 29   GLUCOSE 103* 70 71  BUN 17 23* 35*  CREATININE 2.45* 3.27* 4.66*  CALCIUM 8.4* 8.6* 8.3*  PHOS  --   --  4.6     CBC: Recent Labs  Lab 04/30/17 1843 05/01/17 0648 05/02/17 0358  05/02/17 1625 05/03/17 0400 05/04/17 1202 05/05/17 0346 05/06/17 0408  WBC 4.7 6.7 6.1  --  8.9 6.8 9.0  --   NEUTROABS 3.4  --  4.2  --  6.5  --   --   --   HGB 5.5* 7.1* 6.5* 7.8* 8.0* 7.6* 7.9* 8.4*  HCT 16.2* 19.7* 19.0* 22.8* 22.7* 22.6* 23.3*  --   MCV 110.0* 101.1* 101.2*  --  98.3 101.0* 101.8*  --   PLT 212 189 170  --  163 161 164  --       Lab Results  Component Value Date   HEPBSAG Negative 05/05/2017   HEPBSAB Reactive 05/26/2016      Microbiology:  Recent Results (from the past 240 hour(s))  MRSA PCR Screening     Status: Abnormal   Collection Time: 04/30/17 11:14 PM  Result Value Ref Range Status   MRSA by PCR POSITIVE (A) NEGATIVE Final    Comment:        The GeneXpert MRSA Assay (FDA approved for NASAL specimens only), is one component of a comprehensive MRSA colonization surveillance program. It is not intended to diagnose MRSA infection nor to guide or monitor treatment for MRSA infections. RESULT CALLED TO, READ BACK BY AND VERIFIED WITH: tamara conyers at 0118 on 05/01/17 rww     Coagulation Studies: No results for  input(s): LABPROT, INR in the last 72 hours.  Urinalysis: No results for input(s): COLORURINE, LABSPEC, PHURINE, GLUCOSEU, HGBUR, BILIRUBINUR, KETONESUR, PROTEINUR, UROBILINOGEN, NITRITE, LEUKOCYTESUR in the last 72 hours.  Invalid input(s): APPERANCEUR    Imaging: Dg Chest Port 1 View  Result Date: 05/06/2017 CLINICAL DATA:  Shortness of breath. EXAM: PORTABLE CHEST 1 VIEW COMPARISON:  Radiographs of April 30, 2017. FINDINGS: Stable cardiomediastinal silhouette. No pneumothorax is noted. Increased bibasilar opacities are noted concerning for pulmonary edema or infiltrate with minimal associated pleural effusions. Bony thorax is unremarkable. IMPRESSION: Increased bibasilar opacities are noted most consistent with pulmonary edema or possibly infiltrates with minimal bilateral associated pleural effusions. Electronically Signed    By: Marijo Conception, M.D.   On: 05/06/2017 14:14     Medications:   . sodium chloride     . amLODipine  10 mg Oral Daily  . calcium acetate  1,334 mg Oral TID WC  . carbamazepine  1,000 mg Oral Daily  . cholecalciferol  1,000 Units Oral Daily  . epoetin (EPOGEN/PROCRIT) injection  10,000 Units Intravenous Q M,W,F-HD  . feeding supplement (NEPRO CARB STEADY)  237 mL Oral BID BM  . ferrous sulfate  325 mg Oral Daily  . fluPHENAZine  2.5 mg Oral Daily  . fluticasone  2 spray Each Nare Daily  . fluticasone furoate-vilanterol  1 puff Inhalation Daily  . hydrALAZINE  100 mg Oral Q6H  . midodrine  10 mg Oral Once in dialysis  . minoxidil  10 mg Oral Daily  . multivitamin  1 tablet Oral QHS  . multivitamin with minerals  1 tablet Oral Once per day on Mon Thu  . trihexyphenidyl  5 mg Oral TID WC  . vitamin C  500 mg Oral BID  . [START ON 05/15/2017] vitamin C  500 mg Oral Daily   acetaminophen, baclofen, bisacodyl, cyclobenzaprine, docusate sodium, guaiFENesin-dextromethorphan, ipratropium-albuterol, oxyCODONE-acetaminophen  Assessment/ Plan:  56 y.o. African-American male with end stage renal disease on hemodialysis, schizophrenia, hypertension, chronic hepatitis C, history of DVT. Admitted for symptomatic anemia  [facility] M[facility] [place] Left AVF.  EDW 86 kg  1.  End-stage renal disease 2.  Anemia of chronic kidney disease and possibly blood loss 3.  Secondary hyperparathyroidism 4. HTN  Patient seen during dialysis Tolerating well  BP is low normal Consider d/c minoxidil and lower dose of clonidine and hydralazine   LOS: [place] 12/7/20184:24 [place], [place]

## 2017-05-07 NOTE — Progress Notes (Signed)
HD tx end  

## 2017-05-07 NOTE — Progress Notes (Signed)
HD tx start 

## 2017-05-07 NOTE — Progress Notes (Signed)
Discharge instructions reviewed with Todd Brown of ACT team.  Questions were answered and understanding was verbalized.  [name] to obtain phone number to call when patient reaches home to have oxygen delivered.  Patient discharged home via wheelchair in stable condition escorted by nursing staff.

## 2017-05-10 ENCOUNTER — Inpatient Hospital Stay
Admission: EM | Admit: 2017-05-10 | Discharge: 2017-05-13 | DRG: 640 | Disposition: A | Discharge status: Home Health | Payer: Medicaid Other | Attending: Internal Medicine | Admitting: Internal Medicine

## 2017-05-10 ENCOUNTER — Encounter: Payer: Self-pay | Admitting: *Deleted

## 2017-05-10 ENCOUNTER — Other Ambulatory Visit: Payer: Self-pay

## 2017-05-10 DIAGNOSIS — Z888 Allergy status to other drugs, medicaments and biological substances status: Secondary | ICD-10-CM

## 2017-05-10 DIAGNOSIS — F17210 Nicotine dependence, cigarettes, uncomplicated: Secondary | ICD-10-CM | POA: Diagnosis present

## 2017-05-10 DIAGNOSIS — Z86718 Personal history of other venous thrombosis and embolism: Secondary | ICD-10-CM

## 2017-05-10 DIAGNOSIS — Z7901 Long term (current) use of anticoagulants: Secondary | ICD-10-CM | POA: Diagnosis not present

## 2017-05-10 DIAGNOSIS — E8770 Fluid overload, unspecified: Secondary | ICD-10-CM | POA: Diagnosis present

## 2017-05-10 DIAGNOSIS — N186 End stage renal disease: Secondary | ICD-10-CM | POA: Diagnosis present

## 2017-05-10 DIAGNOSIS — I120 Hypertensive chronic kidney disease with stage 5 chronic kidney disease or end stage renal disease: Secondary | ICD-10-CM | POA: Diagnosis present

## 2017-05-10 DIAGNOSIS — I953 Hypotension of hemodialysis: Secondary | ICD-10-CM | POA: Diagnosis present

## 2017-05-10 DIAGNOSIS — E1122 Type 2 diabetes mellitus with diabetic chronic kidney disease: Secondary | ICD-10-CM | POA: Diagnosis present

## 2017-05-10 DIAGNOSIS — N2581 Secondary hyperparathyroidism of renal origin: Secondary | ICD-10-CM | POA: Diagnosis present

## 2017-05-10 DIAGNOSIS — Z992 Dependence on renal dialysis: Secondary | ICD-10-CM

## 2017-05-10 DIAGNOSIS — Z79899 Other long term (current) drug therapy: Secondary | ICD-10-CM

## 2017-05-10 DIAGNOSIS — Z9981 Dependence on supplemental oxygen: Secondary | ICD-10-CM

## 2017-05-10 DIAGNOSIS — D631 Anemia in chronic kidney disease: Secondary | ICD-10-CM | POA: Diagnosis present

## 2017-05-10 DIAGNOSIS — N4889 Other specified disorders of penis: Secondary | ICD-10-CM

## 2017-05-10 DIAGNOSIS — Z833 Family history of diabetes mellitus: Secondary | ICD-10-CM

## 2017-05-10 DIAGNOSIS — N5089 Other specified disorders of the male genital organs: Secondary | ICD-10-CM | POA: Diagnosis present

## 2017-05-10 DIAGNOSIS — F209 Schizophrenia, unspecified: Secondary | ICD-10-CM | POA: Diagnosis present

## 2017-05-10 DIAGNOSIS — R609 Edema, unspecified: Secondary | ICD-10-CM

## 2017-05-10 DIAGNOSIS — R600 Localized edema: Secondary | ICD-10-CM | POA: Diagnosis present

## 2017-05-10 LAB — COMPREHENSIVE METABOLIC PANEL
ALBUMIN: 3.6 g/dL (ref 3.5–5.0)
ALK PHOS: 123 U/L (ref 38–126)
ALT: 30 U/L (ref 17–63)
AST: 43 U/L — AB (ref 15–41)
Anion gap: 15 (ref 5–15)
BUN: 57 mg/dL — AB (ref 6–20)
CALCIUM: 9.6 mg/dL (ref 8.9–10.3)
CHLORIDE: 101 mmol/L (ref 101–111)
CO2: 26 mmol/L (ref 22–32)
CREATININE: 8.51 mg/dL — AB (ref 0.61–1.24)
GFR calc Af Amer: 7 mL/min — ABNORMAL LOW (ref >60)
GFR calc non Af Amer: 6 mL/min — ABNORMAL LOW (ref >60)
GLUCOSE: 152 mg/dL — AB (ref 65–99)
Potassium: 4.6 mmol/L (ref 3.5–5.1)
SODIUM: 142 mmol/L (ref 135–145)
Total Bilirubin: 0.6 mg/dL (ref 0.3–1.2)
Total Protein: 7.1 g/dL (ref 6.5–8.1)

## 2017-05-10 LAB — CBC
HCT: 30.8 % — ABNORMAL LOW (ref 40.0–52.0)
HEMOGLOBIN: 10.3 g/dL — AB (ref 13.0–18.0)
MCH: 34.5 pg — AB (ref 26.0–34.0)
MCHC: 33.5 g/dL (ref 32.0–36.0)
MCV: 102.9 fL — ABNORMAL HIGH (ref 80.0–100.0)
PLATELETS: 270 10*3/uL (ref 150–440)
RBC: 2.99 MIL/uL — ABNORMAL LOW (ref 4.40–5.90)
RDW: 20.2 % — ABNORMAL HIGH (ref 11.5–14.5)
WBC: 7.8 10*3/uL (ref 3.8–10.6)

## 2017-05-10 MED ORDER — INSULIN ASPART 100 UNIT/ML ~~LOC~~ SOLN: 0.0000 [IU] | Freq: Every day | SUBCUTANEOUS | Status: DC

## 2017-05-10 MED ORDER — HYDRALAZINE HCL 20 MG/ML IJ SOLN: 20.0000 mg | Freq: Four times a day (QID) | INTRAMUSCULAR | Status: DC | PRN

## 2017-05-10 MED ORDER — INSULIN ASPART 100 UNIT/ML ~~LOC~~ SOLN
0.0000 [IU] | Freq: Three times a day (TID) | SUBCUTANEOUS | Status: DC
  Administered 2017-05-11: 3 [IU] via SUBCUTANEOUS
  Administered 2017-05-12: 2 [IU] via SUBCUTANEOUS
  Filled 2017-05-10 (×2): qty 1

## 2017-05-10 NOTE — ED Notes (Signed)
Pt brought in via ems from home with a swollen scrotum and penis.  Sx began today. Pt missed dialysis today.  Pt denies chest pain. Pt states sob today.  Pt alert. md at bedside.  nsr on moniotr.  Skin warm and dry.  Iv started and labs sent.

## 2017-05-10 NOTE — H&P (Signed)
[place] at [place] NAME: Todd Brown    MR#:  [id number]  DATE OF BIRTH:  11-Jul-1960  DATE OF ADMISSION:  05/10/2017  PRIMARY CARE PHYSICIAN: McLean-Scocuzza, Nino Glow, MD   REQUESTING/REFERRING PHYSICIAN:   CHIEF COMPLAINT:   Chief Complaint  Patient presents with  . Groin Swelling    HISTORY OF PRESENT ILLNESS: Todd Brown  is a 56 y.o. male with a known history per below, presents emergency room with recurrent scrotal and penile swelling, missed hemodialysis on today, shortness of breath, dyspnea on exertion, on home oxygen as needed, ER workup unimpressive, clinically has fluid overload state, patient admitted for acute fluid overload state in patient with chronic end-stage renal disease with acute scrotal/penile edema.  PAST MEDICAL HISTORY:   Past Medical History:  Diagnosis Date  . Anemia   . Diabetes mellitus without complication ([place])   . Dialysis patient [facility])    Mon. -Wed.- Fri  . DVT (deep venous thrombosis) ([place])    cephalic and basolic vein thrombosis  . ESRD (end stage renal disease) ([place])   . Hyperlipidemia   . Malignant hypertension   . Renal artery stenosis ([place])   . Schizophrenia ([place])     PAST SURGICAL HISTORY:  Past Surgical History:  Procedure Laterality Date  . A/V FISTULAGRAM N/A 08/06/2016   Procedure: A/V Fistulagram;  Surgeon: Algernon Huxley, MD;  Location: [place] [facility];  Service: Cardiovascular;  Laterality: N/A;  . A/V SHUNT INTERVENTION N/A 08/06/2016   Procedure: A/V Shunt Intervention;  Surgeon: Algernon Huxley, MD;  Location: [place] [facility];  Service: Cardiovascular;  Laterality: N/A;  . AV FISTULA PLACEMENT Left 12/26/2014   Procedure: ARTERIOVENOUS (AV) FISTULA CREATION;  Surgeon: Algernon Huxley, MD;  Location: [facility] ORS;  Service: Vascular;  Laterality: Left;  . ESOPHAGOGASTRODUODENOSCOPY (EGD) WITH PROPOFOL N/A 05/06/2017   Procedure: ESOPHAGOGASTRODUODENOSCOPY (EGD) WITH PROPOFOL;   Surgeon: Jonathon Bellows, MD;  Location: [facility] ENDOSCOPY;  Service: Gastroenterology;  Laterality: N/A;  . INSERTION OF DIALYSIS CATHETER Right   . PERIPHERAL VASCULAR CATHETERIZATION N/A 10/22/2014   Procedure: Dialysis/Perma Catheter Insertion;  Surgeon: Algernon Huxley, MD;  Location: [place] [facility];  Service: Cardiovascular;  Laterality: N/A;  . PERIPHERAL VASCULAR CATHETERIZATION N/A 02/28/2015   Procedure: Dialysis/Perma Catheter Removal;  Surgeon: Algernon Huxley, MD;  Location: [place] [facility];  Service: Cardiovascular;  Laterality: N/A;  . Repair fx left lower leg     yrs ago (age 66)    SOCIAL HISTORY:  Social History   Tobacco Use  . Smoking status: Current Every Day Smoker    Packs/day: 0.50    Types: Cigarettes  . Smokeless tobacco: Never Used  . Tobacco comment: 10 cigars a day  Substance Use Topics  . Alcohol use: No    FAMILY HISTORY:  Family History  Problem Relation Age of Onset  . Diabetes Neg Hx     DRUG ALLERGIES:  Allergies  Allergen Reactions  . Thorazine [Chlorpromazine] Other (See Comments)    Reaction:  Unknown , pt states it makes him feel real bad    REVIEW OF SYSTEMS:   CONSTITUTIONAL: No fever,+ fatigue/weakness.  EYES: No blurred or double vision.  EARS, NOSE, AND THROAT: No tinnitus or ear pain.  RESPIRATORY: No cough, +shortness of breath, no wheezing or hemoptysis.  CARDIOVASCULAR: No chest pain, orthopnea, + chronic bilateral leg edema.  GASTROINTESTINAL: No nausea, vomiting, diarrhea or abdominal pain.  GENITOURINARY: No dysuria,  hematuria.  Penile/scrotal edema ENDOCRINE: No polyuria, nocturia,  HEMATOLOGY: No anemia, easy bruising or bleeding SKIN: No rash or lesion. MUSCULOSKELETAL: No joint pain or arthritis.   NEUROLOGIC: No tingling, numbness, weakness.  PSYCHIATRY: No anxiety or depression.   MEDICATIONS AT HOME:  Prior to Admission medications   Medication Sig Start Date End Date Taking? Authorizing Provider  acetaminophen  (TYLENOL) 500 MG tablet Take 500 mg by mouth every 4 (four) hours as needed for mild pain or moderate pain.    Yes [provider]  albuterol (PROAIR HFA) 108 (90 Base) MCG/ACT inhaler Inhale 1-2 puffs into the lungs every 4 (four) hours as needed for wheezing or shortness of breath.    Yes [provider]  amLODipine (NORVASC) 10 MG tablet Take 0.5 tablets (5 mg total) by mouth daily. 05/07/17  Yes Henreitta Leber, MD  calcium acetate (PHOSLO) 667 MG capsule Take 2 capsules (1,334 mg total) by mouth 3 (three) times daily with meals. 05/19/15  Yes Fritzi Mandes, MD  carbamazepine (TEGRETOL XR) 200 MG 12 hr tablet Take 1,000 mg by mouth every morning.   Yes [provider]  cholecalciferol (VITAMIN D) 1000 UNITS tablet Take 1,000 Units by mouth daily.   Yes [provider]  cloNIDine (CATAPRES) 0.1 MG tablet Take 1 tablet (0.1 mg total) by mouth 2 (two) times daily. 05/07/17  Yes Sainani, Belia Heman, MD  ferrous sulfate 325 (65 FE) MG tablet Take 1 tablet (325 mg total) by mouth daily. 05/19/15  Yes Fritzi Mandes, MD  fluPHENAZine (PROLIXIN) 2.5 MG tablet Take 2.5 mg by mouth 2 (two) times daily.    Yes [provider]  Fluticasone-Salmeterol (ADVAIR) 500-50 MCG/DOSE AEPB Inhale 1 puff into the lungs 2 (two) times daily.   Yes [provider]  hydrALAZINE (APRESOLINE) 100 MG tablet Take 0.5 tablets (50 mg total) by mouth every 8 (eight) hours. 05/07/17  Yes Henreitta Leber, MD  multivitamin (RENA-VIT) TABS tablet Take 1 tablet by mouth daily.   Yes [provider]  omeprazole (PRILOSEC) 40 MG capsule Take 1 capsule (40 mg total) by mouth daily. 05/06/17  Yes Sainani, Belia Heman, MD  traMADol (ULTRAM) 50 MG tablet Take 50 mg by mouth 2 (two) times daily.   Yes [provider]  trihexyphenidyl (ARTANE) 2 MG tablet Take 2 mg by mouth 2 (two) times daily.   Yes [provider]  vitamin C (VITAMIN C) 500 MG tablet 1 tab PO BID X 9 days and  then start 1 tab daily. 05/06/17  Yes Sainani, Belia Heman, MD      PHYSICAL EXAMINATION:   VITAL SIGNS: Blood pressure 133/89, pulse 88, temperature 98 F (36.7 C), temperature source Oral, resp. rate 13, height 6' (1.829 m), weight 81.6 kg (180 lb), SpO2 99 %.  GENERAL:  56 y.o.-year-old patient lying in the bed with no acute distress.  Frail appearing, nontoxic EYES: Pupils equal, round, reactive to light and accommodation. No scleral icterus. Extraocular muscles intact.  HEENT: Head atraumatic, normocephalic. Oropharynx and nasopharynx clear.  NECK:  Supple, no jugular venous distention. No thyroid enlargement, no tenderness.  LUNGS: Diminished breath sounds at bases bilaterally. No use of accessory muscles of respiration.  CARDIOVASCULAR: S1, S2 normal. No murmurs, rubs, or gallops.  ABDOMEN: Soft, nontender, nondistended. Bowel sounds present. No organomegaly or mass.  Penile and scrotal edema  EXTREMITIES: Bilateral lower extremity edema, cyanosis, or clubbing.  NEUROLOGIC: Cranial nerves II through XII are intact. MAES. Gait not  checked.  PSYCHIATRIC: The patient is alert and oriented x 3.  SKIN: No obvious rash, lesion, or ulcer.   LABORATORY PANEL:   CBC Recent Labs  Lab 05/04/17 1202 05/05/17 0346 05/06/17 0408 05/10/17 2021  WBC 6.8 9.0  --  7.8  HGB 7.6* 7.9* 8.4* 10.3*  HCT 22.6* 23.3*  --  30.8*  PLT 161 164  --  270  MCV 101.0* 101.8*  --  102.9*  MCH 34.0 34.5*  --  34.5*  MCHC 33.7 33.8  --  33.5  RDW 21.9* 21.2*  --  20.2*   ------------------------------------------------------------------------------------------------------------------  Chemistries  Recent Labs  Lab 05/06/17 0408 05/10/17 2021  NA 134* 142  K 4.0 4.6  CL 94* 101  CO2 29 26  GLUCOSE 71 152*  BUN 35* 57*  CREATININE 4.66* 8.51*  CALCIUM 8.3* 9.6  AST  --  43*  ALT  --  30  ALKPHOS  --  123  BILITOT  --  0.6    ------------------------------------------------------------------------------------------------------------------ estimated creatinine clearance is 10.6 mL/min (A) (by C-G formula based on SCr of 8.51 mg/dL (H)). ------------------------------------------------------------------------------------------------------------------ No results for input(s): TSH, T4TOTAL, T3FREE, THYROIDAB in the last 72 hours.  Invalid input(s): FREET3   Coagulation profile No results for input(s): INR, PROTIME in the last 168 hours. ------------------------------------------------------------------------------------------------------------------- No results for input(s): DDIMER in the last 72 hours. -------------------------------------------------------------------------------------------------------------------  Cardiac Enzymes No results for input(s): CKMB, TROPONINI, MYOGLOBIN in the last 168 hours.  Invalid input(s): CK ------------------------------------------------------------------------------------------------------------------ Invalid input(s): POCBNP  ---------------------------------------------------------------------------------------------------------------  Urinalysis    Component Value Date/Time   COLORURINE YELLOW (A) 05/17/2015 0012   APPEARANCEUR HAZY (A) 05/17/2015 0012   LABSPEC 1.009 05/17/2015 0012   PHURINE 7.0 05/17/2015 0012   GLUCOSEU NEGATIVE 05/17/2015 0012   HGBUR 1+ (A) 05/17/2015 0012   BILIRUBINUR NEGATIVE 05/17/2015 0012   KETONESUR NEGATIVE 05/17/2015 0012   PROTEINUR 30 (A) 05/17/2015 0012   NITRITE NEGATIVE 05/17/2015 0012   LEUKOCYTESUR 3+ (A) 05/17/2015 0012     RADIOLOGY: No results found.  EKG: Orders placed or performed during the hospital encounter of 05/10/17  . ED EKG  . ED EKG  . EKG 12-Lead  . EKG 12-Lead    IMPRESSION AND PLAN: 1 acute recurrent scrotal/penile edema Most likely secondary to acute fluid overload state in a patient  with end-stage renal disease Admit to regular nursing floor bed, nephrology consulted for hemodialysis needs, ice pack with scrotal elevation while in house, strict I&O monitoring, daily weights, renal diet with fluid restriction with, do not pain protocol, and continue close medical monitoring  2 acute fluid overload state Secondary to missed hemodialysis We will consult nephrology for hemodialysis needs  3 chronic end-stage renal disease  HD Monday/Wednesday/Friday Plan of care as stated above  4 chronic diabetes mellitus type 2 Sliding scale insulin with Accu-Cheks per routine  5 chronic benign essential hypertension Stable Continue home regimen  Full code Condition stable Prognosis fair DVT prophylaxis with heparin subcu Disposition home in 1-3 days barring any complications  All the records are reviewed and case discussed with ED provider. Management plans discussed with the patient, family and they are in agreement.  CODE STATUS: Code Status History    Date Active Date Inactive Code Status Order ID Comments User Context   04/30/2017 22:16 05/07/2017 21:58 Full Code [id number]  Vaughan Basta, MD Inpatient   08/03/2016 14:26 08/18/2016 15:47 Full Code [id number]  Wilhelmina Mcardle, MD ED   05/26/2016 17:41 05/31/2016 17:48 Full Code [id number]  Max Sane, MD Inpatient   05/16/2015 23:43 05/20/2015 18:52 Full Code [id number]  Lytle Butte, MD ED   10/20/2014 21:01 10/31/2014 20:02 Full Code [id number]  Hower, Aaron Mose, MD Inpatient       TOTAL TIME TAKING CARE OF THIS PATIENT: 45 minutes.    Avel Peace Salary M.D on 05/10/2017   Between 7am to 6pm - Pager - [phone]  After 6pm go to [email or web address] - password [email or web address] [facility]  Office  [phone]  CC: Primary care physician; McLean-Scocuzza, Nino Glow, MD   Note: This dictation was prepared with Dragon dictation along with smaller phrase technology. Any transcriptional errors that result  from this process are unintentional.

## 2017-05-10 NOTE — ED Triage Notes (Signed)
Pt brought in via ems from home.  Pt has swelling to scrotum and penis. Pt missed dialysis today.  Pt alert.

## 2017-05-10 NOTE — ED Notes (Signed)
Pt sleeping. 

## 2017-05-10 NOTE — ED Provider Notes (Signed)
[facility] Emergency Department Provider Note  ____________________________________________   First MD Initiated Contact with Patient 05/10/17 2020     (approximate)  I have reviewed the triage vital signs and the nursing notes.   HISTORY  Chief Complaint Groin Swelling   HPI Todd Brown is a 56 y.o. male with history of end-stage renal disease on dialysis who is presenting to the emergency department today with scrotal and penile swelling.  He says that he missed dialysis today because the ambulance did not come to pick him up.  He says that his bilateral lower extremities are also swollen.  He is denying any shortness of breath when he is wearing his home oxygen but says that when he is off he becomes short of breath which is his baseline.  Denies any pain to me in the lower extremities or testicles.  He says that he has had swelling in similar locations when he has missed dialysis in the past.   Past Medical History:  Diagnosis Date  . Anemia   . Diabetes mellitus without complication ([place])   . Dialysis patient [facility])    Mon. -Wed.- Fri  . 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 ESRD (end stage renal disease) ([place])   . Hyperlipidemia   . Malignant hypertension   . Renal artery stenosis ([place])   . Schizophrenia [facility])     Patient Active Problem List   Diagnosis Date Noted  . Symptomatic anemia 04/30/2017  . Anticoagulated on Coumadin 09/14/2016  . Cardiac arrest ([place]) 08/03/2016  . Sepsis ([place]) 05/26/2016  . Dialysis patient ([place]) 04/15/2016  . End stage renal disease ([place]) 02/12/2016  . Hyperkalemia 05/20/2015  . Hepatitis C 09/26/2013  . Schizophrenia ([place]) 03/23/2013  . Essential hypertension, benign 03/23/2013  . Chronic kidney disease 03/23/2013    Past Surgical History:  Procedure Laterality Date  . A/V FISTULAGRAM N/A 08/06/2016   Procedure: A/V Fistulagram;  Surgeon: Algernon Huxley, MD;   Location: [place] CV LAB;  Service: Cardiovascular;  Laterality: N/A;  . A/V SHUNT INTERVENTION N/A 08/06/2016   Procedure: A/V Shunt Intervention;  Surgeon: Algernon Huxley, MD;  Location: [place] CV LAB;  Service: Cardiovascular;  Laterality: N/A;  . AV FISTULA PLACEMENT Left 12/26/2014   Procedure: ARTERIOVENOUS (AV) FISTULA CREATION;  Surgeon: Algernon Huxley, MD;  Location: [facility] ORS;  Service: Vascular;  Laterality: Left;  . ESOPHAGOGASTRODUODENOSCOPY (EGD) WITH PROPOFOL N/A 05/06/2017   Procedure: ESOPHAGOGASTRODUODENOSCOPY (EGD) WITH PROPOFOL;  Surgeon: Jonathon Bellows, MD;  Location: [facility] ENDOSCOPY;  Service: Gastroenterology;  Laterality: N/A;  . INSERTION OF DIALYSIS CATHETER Right   . PERIPHERAL VASCULAR CATHETERIZATION N/A 10/22/2014   Procedure: Dialysis/Perma Catheter Insertion;  Surgeon: Algernon Huxley, MD;  Location: [place] CV LAB;  Service: Cardiovascular;  Laterality: N/A;  . PERIPHERAL VASCULAR CATHETERIZATION N/A 02/28/2015   Procedure: Dialysis/Perma Catheter Removal;  Surgeon: Algernon Huxley, MD;  Location: [place] CV LAB;  Service: Cardiovascular;  Laterality: N/A;  . Repair fx left lower leg     yrs ago (age 76)    Prior to Admission medications   Medication Sig Start Date End Date Taking? Authorizing Provider  acetaminophen (TYLENOL) 500 MG tablet Take 500 mg by mouth every 4 (four) hours as needed for mild pain or moderate pain.     [provider]  albuterol (PROAIR HFA) 108 (90 Base) MCG/ACT inhaler Inhale 1-2 puffs into the lungs every 4 (  four) hours as needed for wheezing or shortness of breath.     [provider]  amLODipine (NORVASC) 10 MG tablet Take 0.5 tablets (5 mg total) by mouth daily. 05/07/17   Henreitta Leber, MD  calcium acetate (PHOSLO) 667 MG capsule Take 2 capsules (1,334 mg total) by mouth 3 (three) times daily with meals. 05/19/15   Fritzi Mandes, MD  carbamazepine (TEGRETOL XR) 200 MG 12 hr tablet Take 1,000 mg by mouth every  morning.    [provider]  cholecalciferol (VITAMIN D) 1000 UNITS tablet Take 1,000 Units by mouth daily.    [provider]  cloNIDine (CATAPRES) 0.1 MG tablet Take 1 tablet (0.1 mg total) by mouth 2 (two) times daily. 05/07/17   Henreitta Leber, MD  ferrous sulfate 325 (65 FE) MG tablet Take 1 tablet (325 mg total) by mouth daily. 05/19/15   Fritzi Mandes, MD  fluPHENAZine (PROLIXIN) 2.5 MG tablet Take 2.5 mg by mouth 2 (two) times daily.     [provider]  Fluticasone-Salmeterol (ADVAIR) 500-50 MCG/DOSE AEPB Inhale 1 puff into the lungs 2 (two) times daily.    [provider]  hydrALAZINE (APRESOLINE) 100 MG tablet Take 0.5 tablets (50 mg total) by mouth every 8 (eight) hours. 05/07/17   Henreitta Leber, MD  multivitamin (RENA-VIT) TABS tablet Take 1 tablet by mouth daily.    [provider]  omeprazole (PRILOSEC) 40 MG capsule Take 1 capsule (40 mg total) by mouth daily. 05/06/17   Henreitta Leber, MD  traMADol (ULTRAM) 50 MG tablet Take 50 mg by mouth 2 (two) times daily.    [provider]  trihexyphenidyl (ARTANE) 2 MG tablet Take 2 mg by mouth 2 (two) times daily.    [provider]  vitamin C (VITAMIN C) 500 MG tablet 1 tab PO BID X 9 days and then start 1 tab daily. 05/06/17   Henreitta Leber, MD    Allergies Thorazine [chlorpromazine]  Family History  Problem Relation Age of Onset  . Diabetes Neg Hx     Social History Social History   Tobacco Use  . Smoking status: Current Every Day Smoker    Packs/day: 0.50    Types: Cigarettes  . Smokeless tobacco: Never Used  . Tobacco comment: 10 cigars a day  Substance Use Topics  . Alcohol use: No  . Drug use: No    Review of Systems  Constitutional: No fever/chills Eyes: No visual changes. ENT: No sore throat. Cardiovascular: Denies chest pain. Respiratory: As above Gastrointestinal: No abdominal pain.  No nausea, no vomiting.  No diarrhea.  No  constipation. Genitourinary: Negative for dysuria. Musculoskeletal: Negative for back pain. Skin: Negative for rash. Neurological: Negative for headaches, focal weakness or numbness.   ____________________________________________   PHYSICAL EXAM:  VITAL SIGNS: ED Triage Vitals  Enc Vitals Group     BP 05/10/17 2024 (!) 156/78     Pulse Rate 05/10/17 2024 98     Resp 05/10/17 2024 20     Temp 05/10/17 2024 98 F (36.7 C)     Temp Source 05/10/17 2024 Oral     SpO2 05/10/17 2024 97 %     Weight 05/10/17 2022 180 lb (81.6 kg)     Height 05/10/17 2022 6' (1.829 m)     Head Circumference --      Peak Flow --      Pain Score 05/10/17 2022 6     Pain Loc --  Pain Edu? --      Excl. in [place]? --     Constitutional: Alert and oriented. Well appearing and in no acute distress. Eyes: Conjunctivae are normal.  Head: Atraumatic. Nose: No congestion/rhinnorhea. Mouth/Throat: Mucous membranes are moist.  Neck: No stridor.   Cardiovascular: Normal rate, regular rhythm. Grossly normal heart sounds.  Left upper extremity dialysis fistula with palpable thrill Respiratory: Normal respiratory effort.  No retractions. Lungs CTAB. Gastrointestinal: Soft and nontender. No distention.  Genitourinary: Moderately edematous scrotum as well as penis without any induration, erythema or warmth. Musculoskeletal: Moderate to severe bilateral lower extremity edema from the feet extending all the way up to the pelvis. Neurologic:   No gross focal neurologic deficits are appreciated. Skin:  Skin is warm, dry and intact. No rash noted. Psychiatric: Mood and affect are normal. Speech and behavior are normal.  ____________________________________________   LABS (all labs ordered are listed, but only abnormal results are displayed)  Labs Reviewed  CBC  COMPREHENSIVE METABOLIC PANEL   ____________________________________________  EKG  ED ECG REPORT I, Doran Stabler, the attending physician,  personally viewed and interpreted this ECG.   Date: 05/10/2017  EKG Time: 2027  Rate: 91  Rhythm: normal sinus rhythm  Axis: normal  Intervals:none  ST&T Change: No ST segment elevation or depression.  No abnormal T wave inversion.  No peaked T waves.  ____________________________________________  RADIOLOGY   ____________________________________________   PROCEDURES  Procedure(s) performed:   Procedures  Critical Care performed:   ____________________________________________   INITIAL IMPRESSION / ASSESSMENT AND PLAN / ED COURSE  Pertinent labs & imaging results that were available during my care of the patient were reviewed by me and considered in my medical decision making (see chart for details).  DDX: Bilateral lower extremity edema, scrotal edema, penile edema, fluid overload, electrolyte abnormality, hyperkalemia, noncompliance with dialysis  As part of my medical decision making, I reviewed the following data within the [facility] Notes from prior ED visits  ----------------------------------------- 9:16 PM on 05/10/2017 -----------------------------------------  Reassuring lab work.  I discussed the case with Dr. Zollie Scale who advises that the patient call his dialysis center first thing in the morning to schedule and make an appointment tomorrow.  However, when I tell the patient this plan he says that he is unable to walk because of the swelling in his scrotum and his bilateral lower extremities.  He says that he lives alone and is concerned that he may not be able to take care of himself overnight in the morning in order to get himself to dialysis tomorrow.  The patient is also very difficult to understand because his speech is garbled.  I believe this is likely secondary to his poor dentition as well as his underlying psychiatric illness.  I am concerned that the patient will not make dialysis tomorrow and that the condition could worsen.  We will  observe him overnight in the hospital so that he may be dialyzed in the morning.  Patient aware of this plan.  Signed out to Dr. Jerelyn Charles.     ____________________________________________   FINAL CLINICAL IMPRESSION(S) / ED DIAGNOSES  Lower extremity edema.  Scrotal and penile edema.    NEW MEDICATIONS STARTED DURING THIS VISIT:  This SmartLink is deprecated. Use AVSMEDLIST instead to display the medication list for a patient.   Note:  This document was prepared using Dragon voice recognition software and may include unintentional dictation errors.     Orbie Pyo, MD 05/10/17  2117  

## 2017-05-11 ENCOUNTER — Other Ambulatory Visit: Payer: Self-pay

## 2017-05-11 LAB — CBC
HEMATOCRIT: 26.9 % — AB (ref 40.0–52.0)
Hemoglobin: 8.8 g/dL — ABNORMAL LOW (ref 13.0–18.0)
MCH: 33.9 pg (ref 26.0–34.0)
MCHC: 32.9 g/dL (ref 32.0–36.0)
MCV: 103.3 fL — AB (ref 80.0–100.0)
Platelets: 256 10*3/uL (ref 150–440)
RBC: 2.6 MIL/uL — ABNORMAL LOW (ref 4.40–5.90)
RDW: 20.3 % — AB (ref 11.5–14.5)
WBC: 7.1 10*3/uL (ref 3.8–10.6)

## 2017-05-11 LAB — BASIC METABOLIC PANEL
ANION GAP: 13 (ref 5–15)
BUN: 57 mg/dL — AB (ref 6–20)
CALCIUM: 9.2 mg/dL (ref 8.9–10.3)
CO2: 24 mmol/L (ref 22–32)
CREATININE: 8.7 mg/dL — AB (ref 0.61–1.24)
Chloride: 103 mmol/L (ref 101–111)
GFR calc Af Amer: 7 mL/min — ABNORMAL LOW (ref >60)
GFR calc non Af Amer: 6 mL/min — ABNORMAL LOW (ref >60)
GLUCOSE: 74 mg/dL (ref 65–99)
Potassium: 4.7 mmol/L (ref 3.5–5.1)
Sodium: 140 mmol/L (ref 135–145)

## 2017-05-11 LAB — GLUCOSE, CAPILLARY
GLUCOSE-CAPILLARY: 80 mg/dL (ref 65–99)
GLUCOSE-CAPILLARY: 81 mg/dL (ref 65–99)
GLUCOSE-CAPILLARY: 179 mg/dL — AB (ref 65–99)
GLUCOSE-CAPILLARY: 162 mg/dL — AB (ref 65–99)
Glucose-Capillary: 78 mg/dL (ref 65–99)

## 2017-05-11 LAB — PHOSPHORUS: Phosphorus: 3.9 mg/dL (ref 2.5–4.6)

## 2017-05-11 MED ORDER — SODIUM CHLORIDE 0.9% FLUSH
3.0000 mL | Freq: Two times a day (BID) | INTRAVENOUS | Status: DC
  Administered 2017-05-11 – 2017-05-13 (×6): 3 mL via INTRAVENOUS

## 2017-05-11 MED ORDER — AMLODIPINE BESYLATE 5 MG PO TABS
5.0000 mg | ORAL_TABLET | Freq: Every day | ORAL | Status: DC
  Administered 2017-05-11 – 2017-05-13 (×3): 5 mg via ORAL
  Filled 2017-05-11 (×3): qty 1

## 2017-05-11 MED ORDER — PENTAFLUOROPROP-TETRAFLUOROETH EX AERO
1.0000 "application " | INHALATION_SPRAY | CUTANEOUS | Status: DC | PRN
  Filled 2017-05-11: qty 30

## 2017-05-11 MED ORDER — MUPIROCIN 2 % EX OINT
1.0000 "application " | TOPICAL_OINTMENT | Freq: Two times a day (BID) | CUTANEOUS | Status: DC
  Administered 2017-05-11 – 2017-05-13 (×5): 1 via NASAL
  Filled 2017-05-11: qty 22

## 2017-05-11 MED ORDER — PANTOPRAZOLE SODIUM 40 MG PO TBEC
40.0000 mg | DELAYED_RELEASE_TABLET | Freq: Every day | ORAL | Status: DC
Start: 2017-05-11 — End: 2017-05-13
  Administered 2017-05-11 – 2017-05-13 (×3): 40 mg via ORAL
  Filled 2017-05-11 (×3): qty 1

## 2017-05-11 MED ORDER — HYDRALAZINE HCL 50 MG PO TABS
50.0000 mg | ORAL_TABLET | Freq: Three times a day (TID) | ORAL | Status: DC
  Administered 2017-05-11 – 2017-05-13 (×7): 50 mg via ORAL
  Filled 2017-05-11 (×7): qty 1

## 2017-05-11 MED ORDER — SODIUM CHLORIDE 0.9% FLUSH
3.0000 mL | Freq: Two times a day (BID) | INTRAVENOUS | Status: DC
  Administered 2017-05-11 – 2017-05-12 (×5): 3 mL via INTRAVENOUS

## 2017-05-11 MED ORDER — HYDROCODONE-ACETAMINOPHEN 5-325 MG PO TABS
1.0000 | ORAL_TABLET | ORAL | Status: DC | PRN
  Administered 2017-05-11 – 2017-05-13 (×7): 2 via ORAL
  Administered 2017-05-13: 1 via ORAL
  Filled 2017-05-11 (×6): qty 2
  Filled 2017-05-11: qty 1
  Filled 2017-05-11: qty 2

## 2017-05-11 MED ORDER — ONDANSETRON HCL 4 MG PO TABS: 4.0000 mg | ORAL_TABLET | Freq: Four times a day (QID) | ORAL | Status: DC | PRN

## 2017-05-11 MED ORDER — LIDOCAINE-PRILOCAINE 2.5-2.5 % EX CREA
1.0000 "application " | TOPICAL_CREAM | CUTANEOUS | Status: DC | PRN
  Filled 2017-05-11: qty 5

## 2017-05-11 MED ORDER — MOMETASONE FURO-FORMOTEROL FUM 200-5 MCG/ACT IN AERO
2.0000 | INHALATION_SPRAY | Freq: Two times a day (BID) | RESPIRATORY_TRACT | Status: DC
  Administered 2017-05-11 – 2017-05-13 (×6): 2 via RESPIRATORY_TRACT
  Filled 2017-05-11: qty 8.8

## 2017-05-11 MED ORDER — SODIUM CHLORIDE 0.9 % IV SOLN: 250.0000 mL | INTRAVENOUS | Status: DC | PRN

## 2017-05-11 MED ORDER — FLUPHENAZINE HCL 5 MG PO TABS
2.5000 mg | ORAL_TABLET | Freq: Two times a day (BID) | ORAL | Status: DC
  Administered 2017-05-11 – 2017-05-13 (×6): 2.5 mg via ORAL
  Filled 2017-05-11 (×8): qty 1

## 2017-05-11 MED ORDER — ONDANSETRON HCL 4 MG/2ML IJ SOLN: 4.0000 mg | Freq: Four times a day (QID) | INTRAMUSCULAR | Status: DC | PRN

## 2017-05-11 MED ORDER — SODIUM CHLORIDE 0.9% FLUSH: 3.0000 mL | INTRAVENOUS | Status: DC | PRN

## 2017-05-11 MED ORDER — ALTEPLASE 2 MG IJ SOLR: 2.0000 mg | Freq: Once | INTRAMUSCULAR | Status: DC | PRN

## 2017-05-11 MED ORDER — RENA-VITE PO TABS
1.0000 | ORAL_TABLET | Freq: Every day | ORAL | Status: DC
  Administered 2017-05-11 – 2017-05-13 (×3): 1 via ORAL
  Filled 2017-05-11 (×3): qty 1

## 2017-05-11 MED ORDER — ACETAMINOPHEN 325 MG PO TABS: 650.0000 mg | ORAL_TABLET | Freq: Four times a day (QID) | ORAL | Status: DC | PRN

## 2017-05-11 MED ORDER — ALBUTEROL SULFATE (2.5 MG/3ML) 0.083% IN NEBU: 3.0000 mL | INHALATION_SOLUTION | RESPIRATORY_TRACT | Status: DC | PRN

## 2017-05-11 MED ORDER — VITAMIN D 1000 UNITS PO TABS
1000.0000 [IU] | ORAL_TABLET | Freq: Every day | ORAL | Status: DC
  Administered 2017-05-11 – 2017-05-13 (×3): 1000 [IU] via ORAL
  Filled 2017-05-11 (×3): qty 1

## 2017-05-11 MED ORDER — ACETAMINOPHEN 500 MG PO TABS: 500.0000 mg | ORAL_TABLET | ORAL | Status: DC | PRN

## 2017-05-11 MED ORDER — SODIUM CHLORIDE 0.9 % IV SOLN: 100.0000 mL | INTRAVENOUS | Status: DC | PRN

## 2017-05-11 MED ORDER — TRIHEXYPHENIDYL HCL 2 MG PO TABS
2.0000 mg | ORAL_TABLET | Freq: Two times a day (BID) | ORAL | Status: DC
  Administered 2017-05-11 – 2017-05-13 (×4): 2 mg via ORAL
  Filled 2017-05-11 (×8): qty 1

## 2017-05-11 MED ORDER — HEPARIN SODIUM (PORCINE) 5000 UNIT/ML IJ SOLN
5000.0000 [IU] | Freq: Three times a day (TID) | INTRAMUSCULAR | Status: DC
  Administered 2017-05-11 – 2017-05-13 (×5): 5000 [IU] via SUBCUTANEOUS
  Filled 2017-05-11 (×5): qty 1

## 2017-05-11 MED ORDER — ACETAMINOPHEN 650 MG RE SUPP: 650.0000 mg | Freq: Four times a day (QID) | RECTAL | Status: DC | PRN

## 2017-05-11 MED ORDER — LIDOCAINE HCL (PF) 1 % IJ SOLN
5.0000 mL | INTRAMUSCULAR | Status: DC | PRN
  Filled 2017-05-11: qty 5

## 2017-05-11 MED ORDER — HEPARIN SODIUM (PORCINE) 1000 UNIT/ML DIALYSIS
1000.0000 [IU] | INTRAMUSCULAR | Status: DC | PRN
  Filled 2017-05-11: qty 1

## 2017-05-11 MED ORDER — CARBAMAZEPINE ER 200 MG PO TB12
1000.0000 mg | ORAL_TABLET | ORAL | Status: DC
  Administered 2017-05-11 – 2017-05-13 (×3): 1000 mg via ORAL
  Filled 2017-05-11 (×4): qty 5

## 2017-05-11 MED ORDER — CLONIDINE HCL 0.1 MG PO TABS
0.1000 mg | ORAL_TABLET | Freq: Two times a day (BID) | ORAL | Status: DC
  Administered 2017-05-11 – 2017-05-13 (×6): 0.1 mg via ORAL
  Filled 2017-05-11 (×6): qty 1

## 2017-05-11 MED ORDER — CHLORHEXIDINE GLUCONATE CLOTH 2 % EX PADS
6.0000 | MEDICATED_PAD | Freq: Every day | CUTANEOUS | Status: DC
  Administered 2017-05-11 – 2017-05-13 (×3): 6 via TOPICAL

## 2017-05-11 MED ORDER — POLYETHYLENE GLYCOL 3350 17 G PO PACK: 17.0000 g | PACK | Freq: Every day | ORAL | Status: DC | PRN

## 2017-05-11 MED ORDER — VITAMIN C 500 MG PO TABS
500.0000 mg | ORAL_TABLET | Freq: Two times a day (BID) | ORAL | Status: DC
  Administered 2017-05-11 – 2017-05-13 (×6): 500 mg via ORAL
  Filled 2017-05-11 (×7): qty 1

## 2017-05-11 MED ORDER — CALCIUM ACETATE (PHOS BINDER) 667 MG PO CAPS
1334.0000 mg | ORAL_CAPSULE | Freq: Three times a day (TID) | ORAL | Status: DC
  Administered 2017-05-11 – 2017-05-13 (×6): 1334 mg via ORAL
  Filled 2017-05-11 (×6): qty 2

## 2017-05-11 MED ORDER — FERROUS SULFATE 325 (65 FE) MG PO TABS
325.0000 mg | ORAL_TABLET | Freq: Every day | ORAL | Status: DC
  Administered 2017-05-11 – 2017-05-13 (×3): 325 mg via ORAL
  Filled 2017-05-11 (×3): qty 1

## 2017-05-11 MED ORDER — MIDODRINE HCL 5 MG PO TABS
10.0000 mg | ORAL_TABLET | ORAL | Status: DC | PRN
  Administered 2017-05-11 – 2017-05-12 (×2): 10 mg via ORAL
  Filled 2017-05-11 (×3): qty 2

## 2017-05-11 NOTE — Progress Notes (Signed)
Received pt from ED in stable condition. A+Ox4. Admission assessment complete. Oriented to room and unit. BUE +3 edema noted. BLE and scrotal +4 edema noted. Pt medicated with prn norco for scrotal pain. Bed alarm on. Fall and safety precautions maintained.

## 2017-05-11 NOTE — Progress Notes (Signed)
[place] at [place] NAME: Todd Brown    MR#:  [id number]  DATE OF BIRTH:  1961/02/03  SUBJECTIVE:  CHIEF COMPLAINT:   Chief Complaint  Patient presents with  . Groin Swelling   SOB, scrotal and leg swelling. On O2 [place] 2L. REVIEW OF SYSTEMS:  Review of Systems  Constitutional: Negative for chills, fever and malaise/fatigue.  HENT: Negative for sore throat.   Eyes: Negative for blurred vision and double vision.  Respiratory: Positive for cough and shortness of breath. Negative for hemoptysis, sputum production, wheezing and stridor.   Cardiovascular: Positive for leg swelling. Negative for chest pain, palpitations and orthopnea.  Gastrointestinal: Negative for abdominal pain, blood in stool, diarrhea, melena, nausea and vomiting.  Genitourinary: Negative for dysuria, flank pain and hematuria.  Musculoskeletal: Negative for back pain and joint pain.  Skin: Negative for rash.  Neurological: Negative for dizziness, sensory change, focal weakness, seizures, loss of consciousness, weakness and headaches.  Endo/Heme/Allergies: Negative for polydipsia.  Psychiatric/Behavioral: Negative for depression. The patient is not nervous/anxious.     DRUG ALLERGIES:   Allergies  Allergen Reactions  . Thorazine [Chlorpromazine] Other (See Comments)    Reaction:  Unknown , pt states it makes him feel real bad   VITALS:  Blood pressure 131/61, pulse 65, temperature 98.4 F (36.9 C), temperature source Oral, resp. rate 18, height 6\' 3"  (1.905 m), weight 194 lb 3.6 oz (88.1 kg), SpO2 99 %. PHYSICAL EXAMINATION:  Physical Exam  Constitutional: He is oriented to person, place, and time and well-developed, well-nourished, and in no distress.  HENT:  Head: Normocephalic.  Mouth/Throat: Oropharynx is clear and moist.  Eyes: Conjunctivae and EOM are normal. Pupils are equal, round, and reactive to light. No scleral icterus.  Neck: Normal range of  motion. Neck supple. No JVD present. No tracheal deviation present.  Cardiovascular: Normal rate, regular rhythm and normal heart sounds. Exam reveals no gallop.  No murmur heard. Pulmonary/Chest: Effort normal and breath sounds normal. No respiratory distress. He has no wheezes. He has no rales.  Abdominal: Soft. Bowel sounds are normal. He exhibits no distension. There is no tenderness. There is no rebound.  Genitourinary:  Genitourinary Comments:  Scrotal and penile edema  Musculoskeletal: Normal range of motion. He exhibits edema. He exhibits no tenderness.  Neurological: He is alert and oriented to person, place, and time. No cranial nerve deficit.  Skin: No rash noted. No erythema.  Psychiatric: Affect normal.   LABORATORY PANEL:  Male CBC Recent Labs  Lab 05/11/17 0359  WBC 7.1  HGB 8.8*  HCT 26.9*  PLT 256   ------------------------------------------------------------------------------------------------------------------ Chemistries  Recent Labs  Lab 05/10/17 2021 05/11/17 0359  NA 142 140  K 4.6 4.7  CL 101 103  CO2 26 24  GLUCOSE 152* 74  BUN 57* 57*  CREATININE 8.51* 8.70*  CALCIUM 9.6 9.2  AST 43*  --   ALT 30  --   ALKPHOS 123  --   BILITOT 0.6  --    RADIOLOGY:  No results found. ASSESSMENT AND PLAN:   1 acute recurrent scrotal/penile edema Secondary to acute fluid overload state in a patient with end-stage renal disease HD today per Dr. Holley Raring, strict I&O monitoring, daily weights, renal diet with fluid restriction with, do not pain protocol, and continue close medical monitoring  2 acute fluid overload state Secondary to missed hemodialysis HD today per Dr. Holley Raring,  3 chronic end-stage renal disease  HD Monday/Wednesday/Friday  4 chronic diabetes mellitus type 2 Sliding scale insulin with Accu-Cheks per routine  5 chronic benign essential hypertension Stable Continue home regimen  Anemia of chronic disease.  Stable.  Discussed with  Dr. Holley Raring. All the records are reviewed and case discussed with Care Management/Social Worker. Management plans discussed with the patient, family and they are in agreement.  CODE STATUS: Full Code  TOTAL TIME TAKING CARE OF THIS PATIENT: 35 minutes.   More than 50% of the time was spent in counseling/coordination of care: YES  POSSIBLE D/C IN 2 DAYS, DEPENDING ON CLINICAL CONDITION.   Demetrios Loll M.D on 05/11/2017 at 3:45 PM  Between 7am to 6pm - Pager - [phone]  After 6pm go to [email or web address] - [facility]

## 2017-05-11 NOTE — Progress Notes (Signed)
HD completed without issue. Total UF 2.7L. Tolerated well. Vitals stable post treatment. Report called to primary RN

## 2017-05-11 NOTE — Progress Notes (Signed)
[facility], [place] 05/11/17  Subjective:  Patient readmitted for increasing lower extremity edema as well as shortness of breath. He was unable to get to his outpatient hemodialysis yesterday given the inclement weather recently. He is now seen and evaluated during hemodialysis and appears to be tolerating well.   Objective:  Vital signs in last 24 hours:  Temp:  [98 F (36.7 C)-98.1 F (36.7 C)] 98.1 F (36.7 C) (12/11 0614) Pulse Rate:  [63-98] 69 (12/11 0614) Resp:  [13-20] 20 (12/11 0614) BP: (133-168)/(69-94) 142/69 (12/11 0614) SpO2:  [94 %-100 %] 98 % (12/11 0614) Weight:  [81.6 kg (180 lb)-90.8 kg (200 lb 3.2 oz)] 90.8 kg (200 lb 3.2 oz) (12/11 0133)  Weight change:  Filed Weights   05/10/17 2022 05/11/17 0133  Weight: 81.6 kg (180 lb) 90.8 kg (200 lb 3.2 oz)    Intake/Output:    Intake/Output Summary (Last 24 hours) at 05/11/2017 1016 Last data filed at 05/11/2017 0834 Gross per 24 hour  Intake 120 ml  Output 500 ml  Net -380 ml     Physical Exam: General:  No acute distress, laying in the bed  HEENT  anicteric, moist oral mucous membranes  Neck  supple  Pulm/lungs  normal breathing effort, basilar rales  CVS/Heart  no rub or gallop, no rubs  Abdomen:   Soft, nontender  Extremities:  2+ lower extremity edema  Neurologic:  resting quietly  Skin:  No acute rashes  Access:  Left arm AV fistula       Basic Metabolic Panel:  Recent Labs  Lab 05/06/17 0408 05/10/17 2021 05/11/17 0359  NA 134* 142 140  K 4.0 4.6 4.7  CL 94* 101 103  CO2 29 26 24   GLUCOSE 71 152* 74  BUN 35* 57* 57*  CREATININE 4.66* 8.51* 8.70*  CALCIUM 8.3* 9.6 9.2  PHOS 4.6  --   --      CBC: Recent Labs  Lab 05/04/17 1202 05/05/17 0346 05/06/17 0408 05/10/17 2021 05/11/17 0359  WBC 6.8 9.0  --  7.8 7.1  HGB 7.6* 7.9* 8.4* 10.3* 8.8*  HCT 22.6* 23.3*  --  30.8* 26.9*  MCV 101.0* 101.8*  --  102.9* 103.3*  PLT 161 164  --  270 256       Lab Results  Component Value Date   HEPBSAG Negative 05/05/2017   HEPBSAB Reactive 05/26/2016      Microbiology:  No results found for this or any previous visit (from the past 240 hour(s)).  Coagulation Studies: No results for input(s): LABPROT, INR in the last 72 hours.  Urinalysis: No results for input(s): COLORURINE, LABSPEC, PHURINE, GLUCOSEU, HGBUR, BILIRUBINUR, KETONESUR, PROTEINUR, UROBILINOGEN, NITRITE, LEUKOCYTESUR in the last 72 hours.  Invalid input(s): APPERANCEUR    Imaging: No results found.   Medications:   . sodium chloride    . sodium chloride    . sodium chloride     . amLODipine  5 mg Oral Daily  . calcium acetate  1,334 mg Oral TID WC  . carbamazepine  1,000 mg Oral BH-q7a  . Chlorhexidine Gluconate Cloth  6 each Topical Q0600  . cholecalciferol  1,000 Units Oral Daily  . cloNIDine  0.1 mg Oral BID  . ferrous sulfate  325 mg Oral Daily  . fluPHENAZine  2.5 mg Oral BID  . heparin  5,000 Units Subcutaneous Q8H  . hydrALAZINE  50 mg Oral Q8H  . insulin aspart  0-15 Units Subcutaneous TID WC  .  insulin aspart  0-5 Units Subcutaneous QHS  . mometasone-formoterol  2 puff Inhalation BID  . multivitamin  1 tablet Oral Daily  . mupirocin ointment  1 application Nasal BID  . pantoprazole  40 mg Oral Daily  . sodium chloride flush  3 mL Intravenous Q12H  . sodium chloride flush  3 mL Intravenous Q12H  . trihexyphenidyl  2 mg Oral BID  . ascorbic acid  500 mg Oral BID   sodium chloride, sodium chloride, sodium chloride, acetaminophen **OR** acetaminophen, albuterol, alteplase, heparin, hydrALAZINE, HYDROcodone-acetaminophen, lidocaine (PF), lidocaine-prilocaine, ondansetron **OR** ondansetron (ZOFRAN) IV, pentafluoroprop-tetrafluoroeth, polyethylene glycol, sodium chloride flush  Assessment/ Plan:  56 y.o. African-American male with end stage renal disease on hemodialysis, schizophrenia, hypertension, chronic hepatitis C, history of  DVT. Admitted for symptomatic anemia  [facility] MWF [place] Left AVF.    1.  End-stage renal disease 2.  Anemia of chronic kidney disease  3.  Secondary hyperparathyroidism 4.  Hypertension with intradialytic hypotension.   Plan:  Patient be admitted now for missed outpatient hemodialysis.  He is seen and evaluated during hemodialysis.  He does have significant lower extremity edema.  I have advised the patient that we will likely need to perform daily dialysis for several days.  He verbalized understanding of this.  Continue to use midodrine with dialysis treatments.  Continue to monitor his status closely.   LOS: 1 Shakeita Vandevander 12/11/201810:16 [place], [place]

## 2017-05-11 NOTE — Progress Notes (Signed)
Pre hd 

## 2017-05-11 NOTE — Progress Notes (Signed)
Post HD assessment unchanged  

## 2017-05-11 NOTE — ED Notes (Signed)
Report called to carly rn floor nurse

## 2017-05-11 NOTE — Progress Notes (Signed)
HD initaited via L AVF with 15g needles x2. Patient currently without complaints. Sob when talking. UF goal 2.5L. Continue to monitor closely

## 2017-05-11 NOTE — Progress Notes (Signed)
Pt BG was 81 after dialysis. Blood glucose result did not flow to the chart.

## 2017-05-12 LAB — PARATHYROID HORMONE, INTACT (NO CA): PTH: 194 pg/mL — ABNORMAL HIGH (ref 15–65)

## 2017-05-12 LAB — GLUCOSE, CAPILLARY
GLUCOSE-CAPILLARY: 87 mg/dL (ref 65–99)
Glucose-Capillary: 89 mg/dL (ref 65–99)
Glucose-Capillary: 130 mg/dL — ABNORMAL HIGH (ref 65–99)
Glucose-Capillary: 104 mg/dL — ABNORMAL HIGH (ref 65–99)

## 2017-05-12 MED ORDER — EPOETIN ALFA 10000 UNIT/ML IJ SOLN
10000.0000 [IU] | INTRAMUSCULAR | Status: DC
  Administered 2017-05-12: 10000 [IU] via INTRAVENOUS

## 2017-05-12 NOTE — Care Management (Signed)
Patient admitted from home with fluid overload.  Patient recently discharged from [facility].  Patient lives at home alone.  Followed by ACT team.  Previous admission patient was discharged with home health through [facility].  Sarah with [facility] is aware of admission.  Case was not opened prior to readmission.  Previous admission patient was also set up with O2 through [facility].  RNCM following.

## 2017-05-12 NOTE — Progress Notes (Signed)
Todd Brown, [place] 05/12/17  Subjective:  Patient seen and evaluated during hemodialysis. He appears to be tolerating well.   Objective:  Vital signs in last 24 hours:  Temp:  [98.4 F (36.9 C)-99.1 F (37.3 C)] 99.1 F (37.3 C) (12/12 0945) Pulse Rate:  [53-70] 66 (12/12 1200) Resp:  [16-20] (P) 16 (12/12 1130) BP: (112-140)/(60-81) 136/76 (12/12 1200) SpO2:  [96 %-100 %] 97 % (12/12 1200) Weight:  [88.1 kg (194 lb 3.6 oz)-89.8 kg (197 lb 15.6 oz)] 89.8 kg (197 lb 15.6 oz) (12/12 0945)  Weight change: 6.452 kg (14 lb 3.6 oz) Filed Weights   05/11/17 1340 05/12/17 0434 05/12/17 0945  Weight: 88.1 kg (194 lb 3.6 oz) 88.9 kg (195 lb 14.4 oz) 89.8 kg (197 lb 15.6 oz)    Intake/Output:    Intake/Output Summary (Last 24 hours) at 05/12/2017 1218 Last data filed at 05/12/2017 0805 Gross per 24 hour  Intake 480 ml  Output 3000 ml  Net -2520 ml     Physical Exam: General:  No acute distress, laying in the bed  HEENT  anicteric, moist oral mucous membranes  Neck  supple  Pulm/lungs  normal breathing effort, basilar rales  CVS/Heart  no rub or gallop, no rubs  Abdomen:   Soft, nontender  Extremities:  2+ lower extremity edema  Neurologic:  resting quietly  Skin:  No acute rashes  Access:  Left arm AV fistula       Basic Metabolic Panel:  Recent Labs  Lab 05/06/17 0408 05/10/17 2021 05/11/17 0359 05/11/17 0959  NA 134* 142 140  --   K 4.0 4.6 4.7  --   CL 94* 101 103  --   CO2 29 26 24   --   GLUCOSE 71 152* 74  --   BUN 35* 57* 57*  --   CREATININE 4.66* 8.51* 8.70*  --   CALCIUM 8.3* 9.6 9.2  --   PHOS 4.6  --   --  3.9     CBC: Recent Labs  Lab 05/06/17 0408 05/10/17 2021 05/11/17 0359  WBC  --  7.8 7.1  HGB 8.4* 10.3* 8.8*  HCT  --  30.8* 26.9*  MCV  --  102.9* 103.3*  PLT  --  270 256      Lab Results  Component Value Date   HEPBSAG Negative 05/05/2017   HEPBSAB Reactive 05/26/2016       Microbiology:  No results found for this or any previous visit (from the past 240 hour(s)).  Coagulation Studies: No results for input(s): LABPROT, INR in the last 72 hours.  Urinalysis: No results for input(s): COLORURINE, LABSPEC, PHURINE, GLUCOSEU, HGBUR, BILIRUBINUR, KETONESUR, PROTEINUR, UROBILINOGEN, NITRITE, LEUKOCYTESUR in the last 72 hours.  Invalid input(s): APPERANCEUR    Imaging: No results found.   Medications:   . sodium chloride     . amLODipine  5 mg Oral Daily  . calcium acetate  1,334 mg Oral TID WC  . carbamazepine  1,000 mg Oral BH-q7a  . Chlorhexidine Gluconate Cloth  6 each Topical Q0600  . cholecalciferol  1,000 Units Oral Daily  . cloNIDine  0.1 mg Oral BID  . ferrous sulfate  325 mg Oral Daily  . fluPHENAZine  2.5 mg Oral BID  . heparin  5,000 Units Subcutaneous Q8H  . hydrALAZINE  50 mg Oral Q8H  . insulin aspart  0-15 Units Subcutaneous TID WC  . insulin aspart  0-5 Units Subcutaneous QHS  . mometasone-formoterol  2 puff Inhalation BID  . multivitamin  1 tablet Oral Daily  . mupirocin ointment  1 application Nasal BID  . pantoprazole  40 mg Oral Daily  . sodium chloride flush  3 mL Intravenous Q12H  . sodium chloride flush  3 mL Intravenous Q12H  . trihexyphenidyl  2 mg Oral BID  . ascorbic acid  500 mg Oral BID   sodium chloride, acetaminophen **OR** acetaminophen, albuterol, hydrALAZINE, HYDROcodone-acetaminophen, midodrine, ondansetron **OR** ondansetron (ZOFRAN) IV, polyethylene glycol, sodium chloride flush  Assessment/ Plan:  56 y.o. African-American male with end stage renal disease on hemodialysis, schizophrenia, hypertension, chronic hepatitis C, history of DVT. Admitted for symptomatic anemia  [facility] MWF [place] Left AVF.    1.  End-stage renal disease 2.  Anemia of chronic kidney disease  3.  Secondary hyperparathyroidism 4.  Hypertension with intradialytic hypotension.   Plan:  Patient seen and  evaluated during hemodialysis today.  He appears to be tolerating this quite well.  We plan to complete hemodialysis today and will plan for 1 additional dialysis treatment tomorrow to help treat his underlying volume overload.  Hopefully after dialysis tomorrow discharge can be considered.  In addition we will go ahead and start the patient on Epogen 10,000 units IV with dialysis.  Phos under good control at 3.9, will continue to monitor.    LOS: 2 Haniah Penny 12/12/201812:18 PM  [facility] [place], [place]

## 2017-05-12 NOTE — Progress Notes (Signed)
[employer] [facility] NAME: Todd Brown    MR#:  [id number]  DATE OF BIRTH:  [birth year]  SUBJECTIVE:  CHIEF COMPLAINT:   Chief Complaint  Patient presents with  . Groin Swelling   SOB, scrotal and leg swelling improving . On O2 [place] 2L. patient was seen and examined in dialysis unit REVIEW OF SYSTEMS:  Review of Systems  Constitutional: Negative for chills, fever and malaise/fatigue.  HENT: Negative for sore throat.   Eyes: Negative for blurred vision and double vision.  Respiratory: Positive for cough. Negative for hemoptysis, sputum production, shortness of breath, wheezing and stridor.   Cardiovascular: Positive for leg swelling. Negative for chest pain, palpitations and orthopnea.  Gastrointestinal: Negative for abdominal pain, blood in stool, diarrhea, melena, nausea and vomiting.  Genitourinary: Negative for dysuria, flank pain and hematuria.  Musculoskeletal: Negative for back pain and joint pain.  Skin: Negative for rash.  Neurological: Negative for dizziness, sensory change, focal weakness, seizures, loss of consciousness, weakness and headaches.  Endo/Heme/Allergies: Negative for polydipsia.  Psychiatric/Behavioral: Negative for depression. The patient is not nervous/anxious.     DRUG ALLERGIES:   Allergies  Allergen Reactions  . Thorazine [Chlorpromazine] Other (See Comments)    Reaction:  Unknown , pt states it makes him feel real bad   VITALS:  Blood pressure (!) 161/69, pulse 69, temperature 98.4 F (36.9 C), temperature source Oral, resp. rate 14, height 6\' 3"  (1.905 m), weight 86.4 kg (190 lb 7.6 oz), SpO2 93 %. PHYSICAL EXAMINATION:  Physical Exam  Constitutional: He is oriented to person, place, and time and well-developed, well-nourished, and in no distress.  HENT:  Head: Normocephalic.  Mouth/Throat: Oropharynx is clear and moist.  Eyes: Conjunctivae and EOM are normal. Pupils are equal, round, and  reactive to light. No scleral icterus.  Neck: Normal range of motion. Neck supple. No JVD present. No tracheal deviation present.  Cardiovascular: Normal rate, regular rhythm and normal heart sounds. Exam reveals no gallop.  No murmur heard. Pulmonary/Chest: Effort normal and breath sounds normal. No respiratory distress. He has no wheezes. He has no rales.  Abdominal: Soft. Bowel sounds are normal. He exhibits no distension. There is no tenderness. There is no rebound.  Genitourinary:  Genitourinary Comments:  Scrotal and penile edema  Musculoskeletal: Normal range of motion. He exhibits edema. He exhibits no tenderness.  Neurological: He is alert and oriented to person, place, and time. No cranial nerve deficit.  Skin: No rash noted. No erythema.  Psychiatric: Affect normal.   LABORATORY PANEL:  Male CBC Recent Labs  Lab 05/11/17 0359  WBC 7.1  HGB 8.8*  HCT 26.9*  PLT 256   ------------------------------------------------------------------------------------------------------------------ Chemistries  Recent Labs  Lab 05/10/17 2021 05/11/17 0359  NA 142 140  K 4.6 4.7  CL 101 103  CO2 26 24  GLUCOSE 152* 74  BUN 57* 57*  CREATININE 8.51* 8.70*  CALCIUM 9.6 9.2  AST 43*  --   ALT 30  --   ALKPHOS 123  --   BILITOT 0.6  --    RADIOLOGY:  No results found. ASSESSMENT AND PLAN:   1 acute recurrent scrotal/penile edema Secondary to acute fluid overload state in a patient with end-stage renal disease  HDpatient had hemodialysis yesterday  Hemodialysis  today per Dr. Holley Raring, planning to do dialysis tomorrow too stressful medication with nephrology strict I&O monitoring, daily weights, renal diet with fluid restriction   pain protocol, and continue close  monitoring  2 acute fluid overload state missed hemodialysis HD today per Dr. Holley Raring,  3 chronic end-stage renal disease  HD Monday/Wednesday/Friday  4 chronic diabetes mellitus type 2 Sliding scale insulin  with Accu-Cheks per routine  5 chronic benign essential hypertension Stable Continue home regimen  Anemia of chronic disease.  Stable.  Discussed with Dr. Holley Raring. All the records are reviewed and case discussed with Care Management/Social Worker. Management plans discussed with the patient, family and they are in agreement.  CODE STATUS: Full Code  TOTAL TIME TAKING CARE OF THIS PATIENT: 35 minutes.   More than 50% of the time was spent in counseling/coordination of care: YES  POSSIBLE D/C IN 1-2 DAYS, DEPENDING ON CLINICAL CONDITION.   [name] M.D on 05/12/2017 at 4:26 PM  Between 7am to 6pm - Pager - [phone]   After 6pm go to [email or web address] - [facility]

## 2017-05-13 LAB — BASIC METABOLIC PANEL
ANION GAP: 9 (ref 5–15)
BUN: 27 mg/dL — ABNORMAL HIGH (ref 6–20)
CHLORIDE: 99 mmol/L — AB (ref 101–111)
CO2: 31 mmol/L (ref 22–32)
Calcium: 8.4 mg/dL — ABNORMAL LOW (ref 8.9–10.3)
Creatinine, Ser: 4.86 mg/dL — ABNORMAL HIGH (ref 0.61–1.24)
GFR calc non Af Amer: 12 mL/min — ABNORMAL LOW (ref >60)
GFR, EST AFRICAN AMERICAN: 14 mL/min — AB (ref >60)
GLUCOSE: 62 mg/dL — AB (ref 65–99)
POTASSIUM: 3.9 mmol/L (ref 3.5–5.1)
Sodium: 139 mmol/L (ref 135–145)

## 2017-05-13 LAB — CBC
HCT: 26.7 % — ABNORMAL LOW (ref 40.0–52.0)
Hemoglobin: 8.8 g/dL — ABNORMAL LOW (ref 13.0–18.0)
MCH: 33.5 pg (ref 26.0–34.0)
MCHC: 32.9 g/dL (ref 32.0–36.0)
MCV: 101.8 fL — ABNORMAL HIGH (ref 80.0–100.0)
PLATELETS: 191 10*3/uL (ref 150–440)
RBC: 2.62 MIL/uL — AB (ref 4.40–5.90)
RDW: 18.8 % — ABNORMAL HIGH (ref 11.5–14.5)
WBC: 4.8 10*3/uL (ref 3.8–10.6)

## 2017-05-13 LAB — GLUCOSE, CAPILLARY
GLUCOSE-CAPILLARY: 65 mg/dL (ref 65–99)
Glucose-Capillary: 104 mg/dL — ABNORMAL HIGH (ref 65–99)

## 2017-05-13 MED ORDER — NEPRO/CARBSTEADY PO LIQD: 237.0000 mL | Freq: Two times a day (BID) | ORAL | Status: DC

## 2017-05-13 MED ORDER — MUPIROCIN 2 % EX OINT: 1.0000 "application " | TOPICAL_OINTMENT | Freq: Two times a day (BID) | CUTANEOUS | 0 refills | Status: DC

## 2017-05-13 NOTE — Progress Notes (Signed)
Pt needed to walk in order to find out if he needs portable O2 tank to go home but pt. refused to walk . Explained the importance of it but keep insisting that he is tired. Pt. " I am fine, I don't need to walk" . I can't force pt to walk . I will attempt again later. MD notified.

## 2017-05-13 NOTE — Care Management (Signed)
Patient to discharge home today.  Resumption home health orders have been placed.  Sarah from [place] notified of discharge.  Portable O2 tank was delivered by [facility] for discharge.  ACT team to transport.  RNCM signing off.

## 2017-05-13 NOTE — Progress Notes (Signed)
[place], [place] 05/13/17  Subjective:  Patient seen and evaluated during hemodialysis. He appears to be tolerating well. Ultrafiltration target 2.5 kg.   Objective:  Vital signs in last 24 hours:  Temp:  [98.3 F (36.8 C)-99 F (37.2 C)] 98.4 F (36.9 C) (12/13 0451) Pulse Rate:  [50-69] 61 (12/13 0930) Resp:  [14-17] 16 (12/13 0451) BP: (128-161)/(69-82) 128/74 (12/13 0451) SpO2:  [93 %-100 %] 96 % (12/13 0930) Weight:  [86.4 kg (190 lb 7.6 oz)] 86.4 kg (190 lb 7.6 oz) (12/12 1330)  Weight change: 1.7 kg (3 lb 12 oz) Filed Weights   05/12/17 0434 05/12/17 0945 05/12/17 1330  Weight: 88.9 kg (195 lb 14.4 oz) 89.8 kg (197 lb 15.6 oz) 86.4 kg (190 lb 7.6 oz)    Intake/Output:    Intake/Output Summary (Last 24 hours) at 05/13/2017 1257 Last data filed at 05/13/2017 0700 Gross per 24 hour  Intake 600 ml  Output 4155 ml  Net -3555 ml     Physical Exam: General:  No acute distress, laying in the bed  HEENT  anicteric, moist oral mucous membranes  Neck  supple  Pulm/lungs  normal breathing effort, basilar rales  CVS/Heart  no rub or gallop, no rubs  Abdomen:   Soft, nontender  Extremities:  2+ lower extremity edema  Neurologic:  resting quietly  Skin:  No acute rashes  Access:  Left arm AV fistula       Basic Metabolic Panel:  Recent Labs  Lab 05/10/17 2021 05/11/17 0359 05/11/17 0959 05/13/17 0427  NA 142 140  --  139  K 4.6 4.7  --  3.9  CL 101 103  --  99*  CO2 26 24  --  31  GLUCOSE 152* 74  --  62*  BUN 57* 57*  --  27*  CREATININE 8.51* 8.70*  --  4.86*  CALCIUM 9.6 9.2  --  8.4*  PHOS  --   --  3.9  --      CBC: Recent Labs  Lab 05/10/17 2021 05/11/17 0359 05/13/17 0427  WBC 7.8 7.1 4.8  HGB 10.3* 8.8* 8.8*  HCT 30.8* 26.9* 26.7*  MCV 102.9* 103.3* 101.8*  PLT 270 256 191      Lab Results  Component Value Date   HEPBSAG Negative 05/05/2017   HEPBSAB Reactive 05/26/2016       Microbiology:  No results found for this or any previous visit (from the past 240 hour(s)).  Coagulation Studies: No results for input(s): LABPROT, INR in the last 72 hours.  Urinalysis: No results for input(s): COLORURINE, LABSPEC, PHURINE, GLUCOSEU, HGBUR, BILIRUBINUR, KETONESUR, PROTEINUR, UROBILINOGEN, NITRITE, LEUKOCYTESUR in the last 72 hours.  Invalid input(s): APPERANCEUR    Imaging: No results found.   Medications:   . sodium chloride     . amLODipine  5 mg Oral Daily  . calcium acetate  1,334 mg Oral TID WC  . carbamazepine  1,000 mg Oral BH-q7a  . Chlorhexidine Gluconate Cloth  6 each Topical Q0600  . cholecalciferol  1,000 Units Oral Daily  . cloNIDine  0.1 mg Oral BID  . [START ON 05/14/2017] epoetin (EPOGEN/PROCRIT) injection  10,000 Units Intravenous Q M,W,F-HD  . ferrous sulfate  325 mg Oral Daily  . fluPHENAZine  2.5 mg Oral BID  . heparin  5,000 Units Subcutaneous Q8H  . hydrALAZINE  50 mg Oral Q8H  . insulin aspart  0-15 Units Subcutaneous TID WC  . insulin aspart  0-5  Units Subcutaneous QHS  . mometasone-formoterol  2 puff Inhalation BID  . multivitamin  1 tablet Oral Daily  . mupirocin ointment  1 application Nasal BID  . pantoprazole  40 mg Oral Daily  . sodium chloride flush  3 mL Intravenous Q12H  . sodium chloride flush  3 mL Intravenous Q12H  . trihexyphenidyl  2 mg Oral BID  . ascorbic acid  500 mg Oral BID   sodium chloride, acetaminophen **OR** acetaminophen, albuterol, hydrALAZINE, HYDROcodone-acetaminophen, midodrine, ondansetron **OR** ondansetron (ZOFRAN) IV, polyethylene glycol, sodium chloride flush  Assessment/ Plan:  56 y.o. African-American male with end stage renal disease on hemodialysis, schizophrenia, hypertension, chronic hepatitis C, history of DVT. Admitted for symptomatic anemia  [facility] M[facility] [place] Left AVF.    1.  End-stage renal disease 2.  Anemia of chronic kidney disease  3.  Secondary  hyperparathyroidism 4.  Hypertension with intradialytic hypotension.   Plan:  Patient has done well with consecutive days of dialysis.  Patient seen and evaluated during hemodialysis today.  Ultrafiltration target is 2.5 kg.  We will continue dialysis as an outpatient and evaluate him for extra dialysis treatments periodically.  This was discussed with the patient and he verbalized understanding.   LOS: 3 Falicity Sheets 12/13/201812:57 PM  [place], [place]

## 2017-05-13 NOTE — Progress Notes (Signed)
Pre HD assessment  

## 2017-05-13 NOTE — Progress Notes (Signed)
HD tx end  

## 2017-05-13 NOTE — Progress Notes (Signed)
Post HD assessment  

## 2017-05-13 NOTE — Discharge Instructions (Signed)
F/U WITH pcp in a week F/u with nephro in a week and continue HD

## 2017-05-13 NOTE — Progress Notes (Signed)
HD tx start 

## 2017-05-13 NOTE — Progress Notes (Signed)
Discharge order received. Patient is alert and oriented. Vital signs stable . No signs of acute distress. Discharge instructions given. Patient verbalized understanding. No other issues noted at this time.   

## 2017-05-13 NOTE — Discharge Summary (Signed)
[facility] NAME: Todd Brown    MR#:  [id number]  DATE OF BIRTH:  [birth year]  DATE OF ADMISSION:  05/10/2017 ADMITTING PHYSICIAN: Gorden Harms, MD  DATE OF DISCHARGE: 05/13/17  PRIMARY CARE PHYSICIAN: McLean-Scocuzza, Nino Glow, MD    ADMISSION DIAGNOSIS:  Penile edema [N48.89] Peripheral edema [R60.9] Scrotal edema [N50.89]  DISCHARGE DIAGNOSIS:  Active Problems:   Scrotal edema  ESRD SECONDARY DIAGNOSIS:   Past Medical History:  Diagnosis Date  . Anemia   . Diabetes mellitus without complication ([place])   . Dialysis patient [facility])    Mon. -Wed.- Fri  . 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 ESRD (end stage renal disease) ([place])   . Hyperlipidemia   . Malignant hypertension   . Renal artery stenosis ([place])   . Schizophrenia [facility])     HOSPITAL COURSE:   HISTORY OF PRESENT ILLNESS: Todd Brown  is a 56 y.o. male with a known history per below, presents emergency room with recurrent scrotal and penile swelling, missed hemodialysis on today, shortness of breath, dyspnea on exertion, on home oxygen as needed, ER workup unimpressive, clinically has fluid overload state, patient admitted for acute fluid overload state in patient with chronic end-stage renal disease with acute scrotal/penile edema.  1acute recurrent scrotal/penile edema Secondary to acute fluid overload state in a patient with end-stage renal disease patient had hemodialysis 12/11, 12/12 Hemodialysis  today per Dr. Holley Raring, planning to d/c pt after HD, ok  with nephrology strict I&O monitoring, daily weights, renal diet with fluid restriction   pain protocol, and continue close monitoring  2acute fluid overload state Clinically better  missed hemodialysis HD today per Dr. Vernie Shanks to d/c pt after HD per nephro  3chronic end-stage renal disease  HDMonday/Wednesday/Friday  4chronic  diabetes mellitus type 2 ADA diet Sliding scale insulin with Accu-Cheks per routine  5chronic benign essential hypertension Stable Continue home regimen  Anemia of chronic disease.  Stable.  Discussed with Dr. Holley Raring.     DISCHARGE CONDITIONS:   FAIR  CONSULTS OBTAINED:  Treatment Team:  Anthonette Legato, MD   PROCEDURES  HD  DRUG ALLERGIES:   Allergies  Allergen Reactions  . Thorazine [Chlorpromazine] Other (See Comments)    Reaction:  Unknown , pt states it makes him feel real bad    DISCHARGE MEDICATIONS:   Allergies as of 05/13/2017      Reactions   Thorazine [chlorpromazine] Other (See Comments)   Reaction:  Unknown , pt states it makes him feel real bad      Medication List    TAKE these medications   acetaminophen 500 MG tablet Commonly known as:  TYLENOL Take 500 mg by mouth every 4 (four) hours as needed for mild pain or moderate pain.   amLODipine 10 MG tablet Commonly known as:  NORVASC Take 0.5 tablets (5 mg total) by mouth daily.   ascorbic acid 500 MG tablet Commonly known as:  VITAMIN C 1 tab PO BID X 9 days and then start 1 tab daily.   calcium acetate 667 MG capsule Commonly known as:  PHOSLO Take 2 capsules (1,334 mg total) by mouth 3 (three) times daily with meals.   carbamazepine 200 MG 12 hr tablet Commonly known as:  TEGRETOL XR Take 1,000 mg by mouth every morning.   cholecalciferol 1000 units tablet Commonly known as:  VITAMIN D Take 1,000 Units by mouth daily.  cloNIDine 0.1 MG tablet Commonly known as:  CATAPRES Take 1 tablet (0.1 mg total) by mouth 2 (two) times daily.   cyclobenzaprine 5 MG tablet Commonly known as:  FLEXERIL Take 5 mg by mouth 2 (two) times daily as needed for muscle spasms.   ferrous sulfate 325 (65 FE) MG tablet Take 1 tablet (325 mg total) by mouth daily.   fluPHENAZine 2.5 MG tablet Commonly known as:  PROLIXIN Take 2.5 mg by mouth 2 (two) times daily.   Fluticasone-Salmeterol  500-50 MCG/DOSE Aepb Commonly known as:  ADVAIR Inhale 1 puff into the lungs 2 (two) times daily.   hydrALAZINE 100 MG tablet Commonly known as:  APRESOLINE Take 0.5 tablets (50 mg total) by mouth every 8 (eight) hours.   multivitamin Tabs tablet Take 1 tablet by mouth daily.   mupirocin ointment 2 % Commonly known as:  BACTROBAN Place 1 application into the nose 2 (two) times daily. apply for 5 days   omeprazole 40 MG capsule Commonly known as:  PRILOSEC Take 1 capsule (40 mg total) by mouth daily.   PROAIR HFA 108 (90 Base) MCG/ACT inhaler Generic drug:  albuterol Inhale 1-2 puffs into the lungs every 4 (four) hours as needed for wheezing or shortness of breath.   traMADol 50 MG tablet Commonly known as:  ULTRAM Take 50 mg by mouth 2 (two) times daily.   trihexyphenidyl 2 MG tablet Commonly known as:  ARTANE Take 2 mg by mouth 2 (two) times daily.        DISCHARGE INSTRUCTIONS:   F/U WITH pcp in a week F/u with nephro in a week and continue HD   DIET:  Cardiac diet and Renal diet  DISCHARGE CONDITION:  Stable  ACTIVITY:  Activity as tolerated  OXYGEN:  Home Oxygen: Yes.     Oxygen Delivery: 2 liters/min via Patient connected to nasal cannula oxygen  DISCHARGE LOCATION:  home   If you experience worsening of your admission symptoms, develop shortness of breath, life threatening emergency, suicidal or homicidal thoughts you must seek medical attention immediately by calling 911 or calling your MD immediately  if symptoms less severe.  You Must read complete instructions/literature along with all the possible adverse reactions/side effects for all the Medicines you take and that have been prescribed to you. Take any new Medicines after you have completely understood and accpet all the possible adverse reactions/side effects.   Please note  You were cared for by a hospitalist during your hospital stay. If you have any questions about your discharge  medications or the care you received while you were in the hospital after you are discharged, you can call the unit and asked to speak with the hospitalist on call if the hospitalist that took care of you is not available. Once you are discharged, your primary care physician will handle any further medical issues. Please note that NO REFILLS for any discharge medications will be authorized once you are discharged, as it is imperative that you return to your primary care physician (or establish a relationship with a primary care physician if you do not have one) for your aftercare needs so that they can reassess your need for medications and monitor your lab values.     Today  Chief Complaint  Patient presents with  . Groin Swelling   Pt is feeling better. Wants to go home ,on ch o2   ROS:  CONSTITUTIONAL: Denies fevers, chills. Denies any fatigue, weakness.  EYES: Denies blurry vision, double  vision, eye pain. EARS, NOSE, THROAT: Denies tinnitus, ear pain, hearing loss. RESPIRATORY: Denies cough, wheeze, shortness of breath.  CARDIOVASCULAR: Denies chest pain, palpitations, edema.  GASTROINTESTINAL: Denies nausea, vomiting, diarrhea, abdominal pain. Denies bright red blood per rectum. GENITOURINARY: Denies dysuria, hematuria. ENDOCRINE: Denies nocturia or thyroid problems. HEMATOLOGIC AND LYMPHATIC: Denies easy bruising or bleeding. SKIN: Denies rash or lesion. MUSCULOSKELETAL: Denies pain in neck, back, shoulder, knees, hips or arthritic symptoms.  NEUROLOGIC: Denies paralysis, paresthesias.  PSYCHIATRIC: Denies anxiety or depressive symptoms.   VITAL SIGNS:  Blood pressure 128/74, pulse 61, temperature 98.4 F (36.9 C), temperature source Oral, resp. rate 16, height 6\' 3"  (1.905 m), weight 86.4 kg (190 lb 7.6 oz), SpO2 100 %.  I/O:    Intake/Output Summary (Last 24 hours) at 05/13/2017 0957 Last data filed at 05/13/2017 0700 Gross per 24 hour  Intake 600 ml  Output 4155 ml   Net -3555 ml    PHYSICAL EXAMINATION:  GENERAL:  56 y.o.-year-old patient lying in the bed with no acute distress.  EYES: Pupils equal, round, reactive to light and accommodation. No scleral icterus. Extraocular muscles intact.  HEENT: Head atraumatic, normocephalic. Oropharynx and nasopharynx clear.  NECK:  Supple, no jugular venous distention. No thyroid enlargement, no tenderness.  LUNGS: Normal breath sounds bilaterally, no wheezing, rales,rhonchi or crepitation. No use of accessory muscles of respiration.  CARDIOVASCULAR: S1, S2 normal. No murmurs, rubs, or gallops.  ABDOMEN: Soft, non-tender, non-distended. Bowel sounds present. No organomegaly or mass.  EXTREMITIES: improving pedal edema, scrotal edema ,no cyanosis, or clubbing.  NEUROLOGIC: Cranial nerves II through XII are intact. Muscle strength 5/5 in all extremities. Sensation intact. Gait not checked.  PSYCHIATRIC: The patient is alert and oriented x 3.  SKIN: No obvious rash, lesion, or ulcer.   DATA REVIEW:   CBC Recent Labs  Lab 05/13/17 0427  WBC 4.8  HGB 8.8*  HCT 26.7*  PLT 191    Chemistries  Recent Labs  Lab 05/10/17 2021  05/13/17 0427  NA 142   < > 139  K 4.6   < > 3.9  CL 101   < > 99*  CO2 26   < > 31  GLUCOSE 152*   < > 62*  BUN 57*   < > 27*  CREATININE 8.51*   < > 4.86*  CALCIUM 9.6   < > 8.4*  AST 43*  --   --   ALT 30  --   --   ALKPHOS 123  --   --   BILITOT 0.6  --   --    < > = values in this interval not displayed.    Cardiac Enzymes No results for input(s): TROPONINI in the last 168 hours.  Microbiology Results  Results for orders placed or performed during the hospital encounter of 04/30/17  MRSA PCR Screening     Status: Abnormal   Collection Time: 04/30/17 11:14 PM  Result Value Ref Range Status   MRSA by PCR POSITIVE (A) NEGATIVE Final    Comment:        The GeneXpert MRSA Assay (FDA approved for NASAL specimens only), is one component of a comprehensive MRSA  colonization surveillance program. It is not intended to diagnose MRSA infection nor to guide or monitor treatment for MRSA infections. RESULT CALLED TO, READ BACK BY AND VERIFIED WITH: tamara conyers at 0118 on 05/01/17 rww     RADIOLOGY:  No results found.  EKG:   Orders placed or performed  during the hospital encounter of 05/10/17  . ED EKG  . ED EKG  . EKG 12-Lead  . EKG 12-Lead      Management plans discussed with the patient, family and they are in agreement.  CODE STATUS:     Code Status Orders  (From admission, onward)        Start     Ordered   05/11/17 0123  Full code  Continuous     05/11/17 0123    Code Status History    Date Active Date Inactive Code Status Order ID Comments User Context   04/30/2017 22:16 05/07/2017 21:58 Full Code [id number]  Vaughan Basta, MD Inpatient   08/03/2016 14:26 08/18/2016 15:47 Full Code [id number]  Wilhelmina Mcardle, MD ED   05/26/2016 17:41 05/31/2016 17:48 Full Code [id number]  Max Sane, MD Inpatient   05/16/2015 23:43 05/20/2015 18:52 Full Code [id number]  Lytle Butte, MD ED   10/20/2014 21:01 10/31/2014 20:02 Full Code [id number]  Hower, Aaron Mose, MD Inpatient      TOTAL TIME TAKING CARE OF THIS PATIENT: 45  minutes.   Note: This dictation was prepared with Dragon dictation along with smaller phrase technology. Any transcriptional errors that result from this process are unintentional.   @[facility] @  on 05/13/2017 at 9:57 AM  Between 7am to 6pm - Pager - [phone]  After 6pm go to [email or web address] - password [email or web address] [facility]  [facility]  Office  [phone]  CC: Primary care physician; McLean-Scocuzza, Nino Glow, MD

## 2017-05-13 NOTE — Progress Notes (Signed)
Pt. finally walked and O2 saturation was 87. Case manager was notified. Portable tank will be ordered and delivered since pt. is already getting O2 service  at home.

## 2017-05-16 ENCOUNTER — Encounter: Payer: Self-pay | Admitting: Gastroenterology

## 2017-06-02 ENCOUNTER — Inpatient Hospital Stay: Payer: Medicaid Other | Admitting: Internal Medicine

## 2017-06-02 ENCOUNTER — Ambulatory Visit: Payer: Medicaid Other | Admitting: Internal Medicine

## 2017-06-10 ENCOUNTER — Ambulatory Visit: Payer: Medicaid Other | Admitting: Internal Medicine

## 2017-06-10 DIAGNOSIS — Z0289 Encounter for other administrative examinations: Secondary | ICD-10-CM

## 2017-06-11 ENCOUNTER — Other Ambulatory Visit: Payer: Medicaid Other

## 2018-09-11 ENCOUNTER — Other Ambulatory Visit: Payer: Self-pay

## 2018-09-11 ENCOUNTER — Inpatient Hospital Stay
Admission: EM | Admit: 2018-09-11 | Discharge: 2018-09-13 | DRG: 291 | Disposition: A | Discharge status: Home / Self Care | Payer: Medicaid Other | Attending: Internal Medicine | Admitting: Internal Medicine

## 2018-09-11 ENCOUNTER — Emergency Department: Payer: Medicaid Other

## 2018-09-11 DIAGNOSIS — I5033 Acute on chronic diastolic (congestive) heart failure: Secondary | ICD-10-CM | POA: Diagnosis present

## 2018-09-11 DIAGNOSIS — I132 Hypertensive heart and chronic kidney disease with heart failure and with stage 5 chronic kidney disease, or end stage renal disease: Principal | ICD-10-CM | POA: Diagnosis present

## 2018-09-11 DIAGNOSIS — R0603 Acute respiratory distress: Secondary | ICD-10-CM

## 2018-09-11 DIAGNOSIS — Z79899 Other long term (current) drug therapy: Secondary | ICD-10-CM

## 2018-09-11 DIAGNOSIS — Z794 Long term (current) use of insulin: Secondary | ICD-10-CM

## 2018-09-11 DIAGNOSIS — J9621 Acute and chronic respiratory failure with hypoxia: Secondary | ICD-10-CM | POA: Diagnosis present

## 2018-09-11 DIAGNOSIS — N186 End stage renal disease: Secondary | ICD-10-CM | POA: Diagnosis present

## 2018-09-11 DIAGNOSIS — J449 Chronic obstructive pulmonary disease, unspecified: Secondary | ICD-10-CM | POA: Diagnosis present

## 2018-09-11 DIAGNOSIS — Z992 Dependence on renal dialysis: Secondary | ICD-10-CM

## 2018-09-11 DIAGNOSIS — D631 Anemia in chronic kidney disease: Secondary | ICD-10-CM | POA: Diagnosis present

## 2018-09-11 DIAGNOSIS — I701 Atherosclerosis of renal artery: Secondary | ICD-10-CM | POA: Diagnosis present

## 2018-09-11 DIAGNOSIS — I509 Heart failure, unspecified: Secondary | ICD-10-CM

## 2018-09-11 DIAGNOSIS — I248 Other forms of acute ischemic heart disease: Secondary | ICD-10-CM | POA: Diagnosis present

## 2018-09-11 DIAGNOSIS — E1165 Type 2 diabetes mellitus with hyperglycemia: Secondary | ICD-10-CM | POA: Diagnosis present

## 2018-09-11 DIAGNOSIS — I471 Supraventricular tachycardia: Secondary | ICD-10-CM | POA: Diagnosis present

## 2018-09-11 DIAGNOSIS — I445 Left posterior fascicular block: Secondary | ICD-10-CM | POA: Diagnosis present

## 2018-09-11 DIAGNOSIS — I161 Hypertensive emergency: Secondary | ICD-10-CM

## 2018-09-11 DIAGNOSIS — Z7982 Long term (current) use of aspirin: Secondary | ICD-10-CM

## 2018-09-11 DIAGNOSIS — Z86718 Personal history of other venous thrombosis and embolism: Secondary | ICD-10-CM

## 2018-09-11 DIAGNOSIS — Z7902 Long term (current) use of antithrombotics/antiplatelets: Secondary | ICD-10-CM

## 2018-09-11 DIAGNOSIS — J810 Acute pulmonary edema: Secondary | ICD-10-CM | POA: Diagnosis present

## 2018-09-11 DIAGNOSIS — F17210 Nicotine dependence, cigarettes, uncomplicated: Secondary | ICD-10-CM | POA: Diagnosis present

## 2018-09-11 DIAGNOSIS — B182 Chronic viral hepatitis C: Secondary | ICD-10-CM | POA: Diagnosis present

## 2018-09-11 DIAGNOSIS — E875 Hyperkalemia: Secondary | ICD-10-CM | POA: Diagnosis present

## 2018-09-11 DIAGNOSIS — F209 Schizophrenia, unspecified: Secondary | ICD-10-CM | POA: Diagnosis present

## 2018-09-11 DIAGNOSIS — E1122 Type 2 diabetes mellitus with diabetic chronic kidney disease: Secondary | ICD-10-CM | POA: Diagnosis present

## 2018-09-11 DIAGNOSIS — E785 Hyperlipidemia, unspecified: Secondary | ICD-10-CM | POA: Diagnosis present

## 2018-09-11 LAB — CBC WITH DIFFERENTIAL/PLATELET
Abs Immature Granulocytes: 0.05 10*3/uL (ref 0.00–0.07)
Basophils Absolute: 0 10*3/uL (ref 0.0–0.1)
Basophils Relative: 0 %
Eosinophils Absolute: 0.1 10*3/uL (ref 0.0–0.5)
Eosinophils Relative: 1 %
HCT: 31.6 % — ABNORMAL LOW (ref 39.0–52.0)
Hemoglobin: 10.6 g/dL — ABNORMAL LOW (ref 13.0–17.0)
Immature Granulocytes: 0 %
Lymphocytes Relative: 7 %
Lymphs Abs: 1 10*3/uL (ref 0.7–4.0)
MCH: 34.3 pg — ABNORMAL HIGH (ref 26.0–34.0)
MCHC: 33.5 g/dL (ref 30.0–36.0)
MCV: 102.3 fL — ABNORMAL HIGH (ref 80.0–100.0)
Monocytes Absolute: 0.6 10*3/uL (ref 0.1–1.0)
Monocytes Relative: 4 %
Neutro Abs: 11.9 10*3/uL — ABNORMAL HIGH (ref 1.7–7.7)
Neutrophils Relative %: 88 %
Platelets: 191 10*3/uL (ref 150–400)
RBC: 3.09 MIL/uL — ABNORMAL LOW (ref 4.22–5.81)
RDW: 13.3 % (ref 11.5–15.5)
WBC: 13.6 10*3/uL — ABNORMAL HIGH (ref 4.0–10.5)
nRBC: 0 % (ref 0.0–0.2)

## 2018-09-11 MED ORDER — ENALAPRILAT 1.25 MG/ML IV SOLN
1.2500 mg | Freq: Once | INTRAVENOUS | Status: AC
  Administered 2018-09-11: 1.25 mg via INTRAVENOUS
  Filled 2018-09-11: qty 2

## 2018-09-11 MED ORDER — FUROSEMIDE 10 MG/ML IJ SOLN
INTRAMUSCULAR | Status: AC
  Administered 2018-09-11: 20 mg via INTRAVENOUS
  Filled 2018-09-11: qty 4

## 2018-09-11 MED ORDER — NITROGLYCERIN 2 % TD OINT
1.0000 [in_us] | TOPICAL_OINTMENT | Freq: Once | TRANSDERMAL | Status: AC
  Administered 2018-09-11: 1 [in_us] via TOPICAL

## 2018-09-11 MED ORDER — NITROGLYCERIN 2 % TD OINT: 1.0000 [in_us] | TOPICAL_OINTMENT | Freq: Once | TRANSDERMAL | Status: DC

## 2018-09-11 MED ORDER — METOPROLOL TARTRATE 5 MG/5ML IV SOLN
INTRAVENOUS | Status: AC
  Administered 2018-09-11: 5 mg via INTRAVENOUS
  Filled 2018-09-11: qty 5

## 2018-09-11 MED ORDER — METOPROLOL TARTRATE 5 MG/5ML IV SOLN
5.0000 mg | Freq: Once | INTRAVENOUS | Status: AC
  Administered 2018-09-11: 5 mg via INTRAVENOUS

## 2018-09-11 MED ORDER — FUROSEMIDE 10 MG/ML IJ SOLN
20.0000 mg | Freq: Once | INTRAMUSCULAR | Status: AC
  Administered 2018-09-11: 20 mg via INTRAVENOUS
  Filled 2018-09-11: qty 4

## 2018-09-11 MED ORDER — METOPROLOL TARTRATE 5 MG/5ML IV SOLN
5.0000 mg | Freq: Once | INTRAVENOUS | Status: AC
  Administered 2018-09-12: 5 mg via INTRAVENOUS
  Filled 2018-09-11: qty 5

## 2018-09-11 MED ORDER — NITROGLYCERIN 2 % TD OINT
1.0000 [in_us] | TOPICAL_OINTMENT | Freq: Once | TRANSDERMAL | Status: AC
  Administered 2018-09-11: 1 [in_us] via TOPICAL
  Filled 2018-09-11: qty 1

## 2018-09-11 NOTE — ED Notes (Signed)
EDP Malinda placed 20g L EJ IV.

## 2018-09-11 NOTE — ED Notes (Signed)
In contact with dialysis/nephrology as pt needs HD emergently. Verb order from [facility].

## 2018-09-11 NOTE — ED Notes (Signed)
Rainbow pulled on pt and sent to lab.

## 2018-09-11 NOTE — ED Notes (Signed)
2nd EKG completed per verb EDP Malinda.

## 2018-09-11 NOTE — ED Notes (Signed)
Wells Guiles RN attempted for IV access twice at [facility].

## 2018-09-11 NOTE — ED Triage Notes (Signed)
Pt in via EMS from home d/t fluid overload; dialysis pt; last HD Friday; SOB for last few days; 2L O2 at home but on non-rebeather with EMS. VSS.

## 2018-09-12 ENCOUNTER — Inpatient Hospital Stay
Admit: 2018-09-12 | Discharge: 2018-09-12 | Disposition: A | Discharge status: Home / Self Care | Payer: Medicaid Other | Attending: Family Medicine | Admitting: Family Medicine

## 2018-09-12 DIAGNOSIS — I445 Left posterior fascicular block: Secondary | ICD-10-CM | POA: Diagnosis present

## 2018-09-12 DIAGNOSIS — Z794 Long term (current) use of insulin: Secondary | ICD-10-CM | POA: Diagnosis not present

## 2018-09-12 DIAGNOSIS — I471 Supraventricular tachycardia: Secondary | ICD-10-CM | POA: Diagnosis present

## 2018-09-12 DIAGNOSIS — J9621 Acute and chronic respiratory failure with hypoxia: Secondary | ICD-10-CM | POA: Diagnosis present

## 2018-09-12 DIAGNOSIS — E1122 Type 2 diabetes mellitus with diabetic chronic kidney disease: Secondary | ICD-10-CM | POA: Diagnosis present

## 2018-09-12 DIAGNOSIS — J449 Chronic obstructive pulmonary disease, unspecified: Secondary | ICD-10-CM | POA: Diagnosis present

## 2018-09-12 DIAGNOSIS — F17210 Nicotine dependence, cigarettes, uncomplicated: Secondary | ICD-10-CM | POA: Diagnosis present

## 2018-09-12 DIAGNOSIS — I5033 Acute on chronic diastolic (congestive) heart failure: Secondary | ICD-10-CM | POA: Diagnosis present

## 2018-09-12 DIAGNOSIS — B182 Chronic viral hepatitis C: Secondary | ICD-10-CM | POA: Diagnosis present

## 2018-09-12 DIAGNOSIS — E1165 Type 2 diabetes mellitus with hyperglycemia: Secondary | ICD-10-CM | POA: Diagnosis present

## 2018-09-12 DIAGNOSIS — E785 Hyperlipidemia, unspecified: Secondary | ICD-10-CM | POA: Diagnosis present

## 2018-09-12 DIAGNOSIS — Z992 Dependence on renal dialysis: Secondary | ICD-10-CM | POA: Diagnosis not present

## 2018-09-12 DIAGNOSIS — Z79899 Other long term (current) drug therapy: Secondary | ICD-10-CM | POA: Diagnosis not present

## 2018-09-12 DIAGNOSIS — R0603 Acute respiratory distress: Secondary | ICD-10-CM | POA: Diagnosis present

## 2018-09-12 DIAGNOSIS — Z86718 Personal history of other venous thrombosis and embolism: Secondary | ICD-10-CM | POA: Diagnosis not present

## 2018-09-12 DIAGNOSIS — Z7982 Long term (current) use of aspirin: Secondary | ICD-10-CM | POA: Diagnosis not present

## 2018-09-12 DIAGNOSIS — F209 Schizophrenia, unspecified: Secondary | ICD-10-CM | POA: Diagnosis present

## 2018-09-12 DIAGNOSIS — J810 Acute pulmonary edema: Secondary | ICD-10-CM | POA: Diagnosis not present

## 2018-09-12 DIAGNOSIS — I161 Hypertensive emergency: Secondary | ICD-10-CM | POA: Diagnosis present

## 2018-09-12 DIAGNOSIS — R079 Chest pain, unspecified: Secondary | ICD-10-CM | POA: Diagnosis not present

## 2018-09-12 DIAGNOSIS — I248 Other forms of acute ischemic heart disease: Secondary | ICD-10-CM | POA: Diagnosis present

## 2018-09-12 DIAGNOSIS — E875 Hyperkalemia: Secondary | ICD-10-CM | POA: Diagnosis present

## 2018-09-12 DIAGNOSIS — D631 Anemia in chronic kidney disease: Secondary | ICD-10-CM | POA: Diagnosis present

## 2018-09-12 DIAGNOSIS — I132 Hypertensive heart and chronic kidney disease with heart failure and with stage 5 chronic kidney disease, or end stage renal disease: Secondary | ICD-10-CM | POA: Diagnosis present

## 2018-09-12 DIAGNOSIS — N186 End stage renal disease: Secondary | ICD-10-CM | POA: Diagnosis present

## 2018-09-12 DIAGNOSIS — Z7902 Long term (current) use of antithrombotics/antiplatelets: Secondary | ICD-10-CM | POA: Diagnosis not present

## 2018-09-12 DIAGNOSIS — I701 Atherosclerosis of renal artery: Secondary | ICD-10-CM | POA: Diagnosis present

## 2018-09-12 LAB — URINE DRUG SCREEN, QUALITATIVE (ARMC ONLY)
Amphetamines, Ur Screen: NOT DETECTED
Barbiturates, Ur Screen: NOT DETECTED
Benzodiazepine, Ur Scrn: NOT DETECTED
Cannabinoid 50 Ng, Ur ~~LOC~~: NOT DETECTED
Cocaine Metabolite,Ur ~~LOC~~: NOT DETECTED
MDMA (Ecstasy)Ur Screen: NOT DETECTED
Methadone Scn, Ur: NOT DETECTED
Opiate, Ur Screen: NOT DETECTED
Phencyclidine (PCP) Ur S: NOT DETECTED
Tricyclic, Ur Screen: NOT DETECTED

## 2018-09-12 LAB — COMPREHENSIVE METABOLIC PANEL
ALT: 24 U/L (ref 0–44)
AST: 30 U/L (ref 15–41)
Albumin: 4 g/dL (ref 3.5–5.0)
Alkaline Phosphatase: 135 U/L — ABNORMAL HIGH (ref 38–126)
Anion gap: 13 (ref 5–15)
BUN: 64 mg/dL — ABNORMAL HIGH (ref 6–20)
CO2: 24 mmol/L (ref 22–32)
Calcium: 9.1 mg/dL (ref 8.9–10.3)
Chloride: 102 mmol/L (ref 98–111)
Creatinine, Ser: 10.66 mg/dL — ABNORMAL HIGH (ref 0.61–1.24)
GFR calc Af Amer: 6 mL/min — ABNORMAL LOW (ref >60)
GFR calc non Af Amer: 5 mL/min — ABNORMAL LOW (ref >60)
Glucose, Bld: 121 mg/dL — ABNORMAL HIGH (ref 70–99)
Potassium: 5.5 mmol/L — ABNORMAL HIGH (ref 3.5–5.1)
Sodium: 139 mmol/L (ref 135–145)
Total Bilirubin: 1 mg/dL (ref 0.3–1.2)
Total Protein: 7.7 g/dL (ref 6.5–8.1)

## 2018-09-12 LAB — GLUCOSE, CAPILLARY
Glucose-Capillary: 106 mg/dL — ABNORMAL HIGH (ref 70–99)
Glucose-Capillary: 108 mg/dL — ABNORMAL HIGH (ref 70–99)
Glucose-Capillary: 92 mg/dL (ref 70–99)
Glucose-Capillary: 88 mg/dL (ref 70–99)
Glucose-Capillary: 107 mg/dL — ABNORMAL HIGH (ref 70–99)

## 2018-09-12 LAB — BASIC METABOLIC PANEL
Anion gap: 13 (ref 5–15)
BUN: 68 mg/dL — ABNORMAL HIGH (ref 6–20)
CO2: 23 mmol/L (ref 22–32)
Calcium: 9.3 mg/dL (ref 8.9–10.3)
Chloride: 101 mmol/L (ref 98–111)
Creatinine, Ser: 10.93 mg/dL — ABNORMAL HIGH (ref 0.61–1.24)
GFR calc Af Amer: 5 mL/min — ABNORMAL LOW (ref >60)
GFR calc non Af Amer: 5 mL/min — ABNORMAL LOW (ref >60)
Glucose, Bld: 123 mg/dL — ABNORMAL HIGH (ref 70–99)
Potassium: 6.3 mmol/L (ref 3.5–5.1)
Sodium: 137 mmol/L (ref 135–145)

## 2018-09-12 LAB — BRAIN NATRIURETIC PEPTIDE: B Natriuretic Peptide: 1176 pg/mL — ABNORMAL HIGH (ref 0.0–100.0)

## 2018-09-12 LAB — TROPONIN I
Troponin I: 0.05 ng/mL (ref <0.03)
Troponin I: 0.32 ng/mL (ref <0.03)
Troponin I: 0.32 ng/mL (ref <0.03)

## 2018-09-12 LAB — CK
Total CK: 218 U/L (ref 49–397)
Total CK: 205 U/L (ref 49–397)

## 2018-09-12 LAB — PHOSPHORUS: Phosphorus: 4.4 mg/dL (ref 2.5–4.6)

## 2018-09-12 LAB — MRSA PCR SCREENING: MRSA by PCR: NEGATIVE

## 2018-09-12 LAB — ECHOCARDIOGRAM COMPLETE
Height: 75 in
Weight: 2659.63 oz

## 2018-09-12 LAB — MAGNESIUM: Magnesium: 2.4 mg/dL (ref 1.7–2.4)

## 2018-09-12 LAB — POTASSIUM: Potassium: 4.2 mmol/L (ref 3.5–5.1)

## 2018-09-12 MED ORDER — NICARDIPINE HCL IN NACL 20-0.86 MG/200ML-% IV SOLN: 3.0000 mg/h | INTRAVENOUS | Status: DC

## 2018-09-12 MED ORDER — LABETALOL HCL 5 MG/ML IV SOLN: 10.0000 mg | INTRAVENOUS | Status: DC | PRN

## 2018-09-12 MED ORDER — FERROUS SULFATE 325 (65 FE) MG PO TABS
325.0000 mg | ORAL_TABLET | Freq: Every day | ORAL | Status: DC
  Administered 2018-09-12 – 2018-09-13 (×2): 325 mg via ORAL
  Filled 2018-09-12 (×3): qty 1

## 2018-09-12 MED ORDER — FENTANYL CITRATE (PF) 100 MCG/2ML IJ SOLN
12.5000 ug | INTRAMUSCULAR | Status: DC | PRN
  Administered 2018-09-12: 12.5 ug via INTRAVENOUS
  Filled 2018-09-12 (×2): qty 2

## 2018-09-12 MED ORDER — INSULIN ASPART 100 UNIT/ML ~~LOC~~ SOLN: 0.0000 [IU] | Freq: Three times a day (TID) | SUBCUTANEOUS | Status: DC

## 2018-09-12 MED ORDER — VITAMIN C 500 MG PO TABS
500.0000 mg | ORAL_TABLET | Freq: Every day | ORAL | Status: DC
  Administered 2018-09-12 – 2018-09-13 (×2): 500 mg via ORAL
  Filled 2018-09-12 (×2): qty 1

## 2018-09-12 MED ORDER — PANTOPRAZOLE SODIUM 40 MG PO TBEC
40.0000 mg | DELAYED_RELEASE_TABLET | Freq: Every day | ORAL | Status: DC
  Administered 2018-09-12 – 2018-09-13 (×2): 40 mg via ORAL
  Filled 2018-09-12 (×2): qty 1

## 2018-09-12 MED ORDER — LORAZEPAM 2 MG/ML IJ SOLN
INTRAMUSCULAR | Status: AC
  Filled 2018-09-12: qty 1

## 2018-09-12 MED ORDER — DEXTROSE 50 % IV SOLN
50.0000 mL | Freq: Once | INTRAVENOUS | Status: AC
  Administered 2018-09-12: 50 mL via INTRAVENOUS
  Filled 2018-09-12: qty 50

## 2018-09-12 MED ORDER — LABETALOL HCL 5 MG/ML IV SOLN: 20.0000 mg | INTRAVENOUS | Status: DC | PRN

## 2018-09-12 MED ORDER — HYDRALAZINE HCL 50 MG PO TABS
50.0000 mg | ORAL_TABLET | Freq: Three times a day (TID) | ORAL | Status: DC
  Administered 2018-09-12 – 2018-09-13 (×4): 50 mg via ORAL
  Filled 2018-09-12 (×4): qty 1

## 2018-09-12 MED ORDER — NITROGLYCERIN 2 % TD OINT
1.0000 [in_us] | TOPICAL_OINTMENT | Freq: Three times a day (TID) | TRANSDERMAL | Status: AC
  Administered 2018-09-12 (×2): 1 [in_us] via TOPICAL
  Filled 2018-09-12 (×3): qty 1

## 2018-09-12 MED ORDER — CARBAMAZEPINE ER 200 MG PO TB12
1000.0000 mg | ORAL_TABLET | ORAL | Status: DC
  Administered 2018-09-12 – 2018-09-13 (×2): 1000 mg via ORAL
  Filled 2018-09-12 (×2): qty 5

## 2018-09-12 MED ORDER — TRAMADOL HCL 50 MG PO TABS
50.0000 mg | ORAL_TABLET | Freq: Two times a day (BID) | ORAL | Status: DC | PRN
  Administered 2018-09-13: 50 mg via ORAL
  Filled 2018-09-12: qty 1

## 2018-09-12 MED ORDER — HYDRALAZINE HCL 20 MG/ML IJ SOLN
5.0000 mg | Freq: Once | INTRAMUSCULAR | Status: DC
  Filled 2018-09-12: qty 1

## 2018-09-12 MED ORDER — FLUPHENAZINE HCL 2.5 MG PO TABS
2.5000 mg | ORAL_TABLET | Freq: Two times a day (BID) | ORAL | Status: DC
  Administered 2018-09-12 – 2018-09-13 (×3): 2.5 mg via ORAL
  Filled 2018-09-12 (×5): qty 1

## 2018-09-12 MED ORDER — RENA-VITE PO TABS
1.0000 | ORAL_TABLET | Freq: Every day | ORAL | Status: DC
  Administered 2018-09-12 – 2018-09-13 (×2): 1 via ORAL
  Filled 2018-09-12 (×2): qty 1

## 2018-09-12 MED ORDER — ACETAMINOPHEN 325 MG PO TABS: 650.0000 mg | ORAL_TABLET | ORAL | Status: DC | PRN

## 2018-09-12 MED ORDER — ZOLPIDEM TARTRATE 5 MG PO TABS: 5.0000 mg | ORAL_TABLET | Freq: Every evening | ORAL | Status: DC | PRN

## 2018-09-12 MED ORDER — CLONIDINE HCL 0.1 MG PO TABS
0.1000 mg | ORAL_TABLET | Freq: Two times a day (BID) | ORAL | Status: DC
  Administered 2018-09-12 (×2): 0.1 mg via ORAL
  Filled 2018-09-12 (×2): qty 1

## 2018-09-12 MED ORDER — ONDANSETRON HCL 4 MG/2ML IJ SOLN
4.0000 mg | Freq: Four times a day (QID) | INTRAMUSCULAR | Status: DC | PRN
  Administered 2018-09-12: 4 mg via INTRAVENOUS
  Filled 2018-09-12: qty 2

## 2018-09-12 MED ORDER — SODIUM CHLORIDE 0.9% FLUSH
3.0000 mL | INTRAVENOUS | Status: DC | PRN
  Administered 2018-09-12: 3 mL via INTRAVENOUS
  Filled 2018-09-12: qty 3

## 2018-09-12 MED ORDER — SODIUM CHLORIDE 0.9 % IV SOLN: 250.0000 mL | INTRAVENOUS | Status: DC | PRN

## 2018-09-12 MED ORDER — INSULIN REGULAR HUMAN 100 UNIT/ML IJ SOLN
10.0000 [IU] | Freq: Once | INTRAMUSCULAR | Status: AC
  Administered 2018-09-12: 10 [IU] via INTRAVENOUS
  Filled 2018-09-12: qty 10

## 2018-09-12 MED ORDER — SODIUM CHLORIDE 0.9% FLUSH
3.0000 mL | Freq: Two times a day (BID) | INTRAVENOUS | Status: DC
  Administered 2018-09-12 (×3): 3 mL via INTRAVENOUS

## 2018-09-12 MED ORDER — SODIUM POLYSTYRENE SULFONATE 15 GM/60ML PO SUSP
15.0000 g | Freq: Once | ORAL | Status: AC
  Administered 2018-09-12: 15 g via ORAL
  Filled 2018-09-12: qty 60

## 2018-09-12 MED ORDER — DILTIAZEM HCL 25 MG/5ML IV SOLN
20.0000 mg | Freq: Once | INTRAVENOUS | Status: AC
  Administered 2018-09-12: 20 mg via INTRAVENOUS

## 2018-09-12 MED ORDER — NICARDIPINE HCL 2.5 MG/ML IV SOLN: 39.7000 ug/min | INTRAVENOUS | Status: DC

## 2018-09-12 MED ORDER — ASPIRIN EC 81 MG PO TBEC
81.0000 mg | DELAYED_RELEASE_TABLET | Freq: Every day | ORAL | Status: DC
  Administered 2018-09-12 – 2018-09-13 (×2): 81 mg via ORAL
  Filled 2018-09-12 (×2): qty 1

## 2018-09-12 MED ORDER — INSULIN ASPART 100 UNIT/ML ~~LOC~~ SOLN: 0.0000 [IU] | Freq: Every day | SUBCUTANEOUS | Status: DC

## 2018-09-12 MED ORDER — TRIHEXYPHENIDYL HCL 2 MG PO TABS
2.0000 mg | ORAL_TABLET | Freq: Two times a day (BID) | ORAL | Status: DC
  Administered 2018-09-12 – 2018-09-13 (×3): 2 mg via ORAL
  Filled 2018-09-12 (×5): qty 1

## 2018-09-12 MED ORDER — LORAZEPAM 2 MG/ML IJ SOLN
0.5000 mg | Freq: Once | INTRAMUSCULAR | Status: AC
  Administered 2018-09-12: 0.5 mg via INTRAVENOUS

## 2018-09-12 MED ORDER — CYCLOBENZAPRINE HCL 10 MG PO TABS
5.0000 mg | ORAL_TABLET | Freq: Two times a day (BID) | ORAL | Status: DC | PRN
  Filled 2018-09-12: qty 0.5

## 2018-09-12 MED ORDER — ALBUTEROL SULFATE (2.5 MG/3ML) 0.083% IN NEBU: 2.5000 mg | INHALATION_SOLUTION | RESPIRATORY_TRACT | Status: DC | PRN

## 2018-09-12 MED ORDER — CALCIUM ACETATE (PHOS BINDER) 667 MG PO CAPS
1334.0000 mg | ORAL_CAPSULE | Freq: Three times a day (TID) | ORAL | Status: DC
  Administered 2018-09-12 – 2018-09-13 (×3): 1334 mg via ORAL
  Filled 2018-09-12 (×6): qty 2

## 2018-09-12 MED ORDER — HYDRALAZINE HCL 20 MG/ML IJ SOLN
10.0000 mg | Freq: Four times a day (QID) | INTRAMUSCULAR | Status: DC | PRN
  Administered 2018-09-12: 10 mg via INTRAVENOUS
  Filled 2018-09-12: qty 1

## 2018-09-12 MED ORDER — HEPARIN SODIUM (PORCINE) 5000 UNIT/ML IJ SOLN
5000.0000 [IU] | Freq: Three times a day (TID) | INTRAMUSCULAR | Status: DC
  Administered 2018-09-12 – 2018-09-13 (×4): 5000 [IU] via SUBCUTANEOUS
  Filled 2018-09-12 (×4): qty 1

## 2018-09-12 MED ORDER — FUROSEMIDE 10 MG/ML IJ SOLN: 80.0000 mg | Freq: Two times a day (BID) | INTRAMUSCULAR | Status: DC

## 2018-09-12 MED ORDER — FUROSEMIDE 10 MG/ML IJ SOLN
60.0000 mg | Freq: Once | INTRAMUSCULAR | Status: AC
  Administered 2018-09-12: 60 mg via INTRAVENOUS
  Filled 2018-09-12: qty 6

## 2018-09-12 MED ORDER — AMLODIPINE BESYLATE 5 MG PO TABS
5.0000 mg | ORAL_TABLET | Freq: Every day | ORAL | Status: DC
  Administered 2018-09-12: 5 mg via ORAL
  Filled 2018-09-12: qty 1

## 2018-09-12 MED ORDER — VITAMIN D 25 MCG (1000 UNIT) PO TABS
1000.0000 [IU] | ORAL_TABLET | Freq: Every day | ORAL | Status: DC
  Administered 2018-09-12 – 2018-09-13 (×2): 1000 [IU] via ORAL
  Filled 2018-09-12 (×2): qty 1

## 2018-09-12 MED ORDER — HYDRALAZINE HCL 20 MG/ML IJ SOLN
10.0000 mg | INTRAMUSCULAR | Status: DC | PRN
  Administered 2018-09-12: 20 mg via INTRAVENOUS
  Filled 2018-09-12: qty 1

## 2018-09-12 NOTE — Progress Notes (Signed)
Post HD Tx     09/12/18 1317  Hand-Off documentation  Report given to (Full Name) Brandi RN ICU  Report received from (Full Name) Trellis Paganini RN   Vital Signs  Temp 98 F (36.7 C)  Temp Source Axillary  Pulse Rate 94  Pulse Rate Source Monitor  Resp 15  BP Location Right Arm  BP Method Automatic  Patient Position (if appropriate) Lying  Oxygen Therapy  SpO2 95 %  O2 Device Nasal Cannula  O2 Flow Rate (L/min) 6 L/min  Pain Assessment  Pain Scale 0-10  Pain Score 0  Dialysis Weight  Weight 75.4 kg  Type of Weight Post-Dialysis  Post-Hemodialysis Assessment  Rinseback Volume (mL) 250 mL  KECN 82.6 V  Dialyzer Clearance Lightly streaked  Duration of HD Treatment -hour(s) 3.5 hour(s)  Hemodialysis Intake (mL) 500 mL  UF Total -Machine (mL) 4500 mL  Net UF (mL) 4000 mL  Tolerated HD Treatment Yes  AVG/AVF Arterial Site Held (minutes) 12 minutes  AVG/AVF Venous Site Held (minutes) 10 minutes  Fistula / Graft Left Upper arm Arteriovenous fistula  No Placement Date or Time found.   Orientation: Left  Access Location: Upper arm  Access Type: Arteriovenous fistula  Site Condition No complications  Fistula / Graft Assessment Present;Thrill;Bruit  Status Deaccessed  Drainage Description None

## 2018-09-12 NOTE — ED Notes (Signed)
2nd Grn top sent to lab.

## 2018-09-12 NOTE — ED Provider Notes (Signed)
[facility] Emergency Department Provider Note   ____________________________________________   First MD Initiated Contact with Patient 09/11/18 2314     (approximate)  I have reviewed the triage vital signs and the nursing notes.   HISTORY  Chief Complaint Shortness of Breath    HPI Todd Brown is a 58 y.o. male who comes in complaining of shortness of breath.  He thinks he overloaded himself with fluid.  He had his last dialysis on Friday.  He comes in with his blood pressure reaching a high of 220/130.  Heart rate up to 150.  He has JVD to the angle some crackles in his lungs.  He has edema in his legs.  He does take his medicines as directed and took him today.         Past Medical History:  Diagnosis Date  . Anemia   . Diabetes mellitus without complication ([place])   . Dialysis patient [facility])    Mon. -Wed.- Fri  . DVT (deep venous thrombosis) ([place])    cephalic and basolic vein thrombosis  . ESRD (end stage renal disease) ([place])   . Hyperlipidemia   . Malignant hypertension   . Renal artery stenosis ([place])   . Schizophrenia [facility])     Patient Active Problem List   Diagnosis Date Noted  . Scrotal edema 05/10/2017  . Symptomatic anemia 04/30/2017  . Anticoagulated on Coumadin 09/14/2016  . Cardiac arrest ([place]) 08/03/2016  . Sepsis ([place]) 05/26/2016  . Dialysis patient ([place]) 04/15/2016  . End stage renal disease ([place]) 02/12/2016  . Hyperkalemia 05/20/2015  . Hepatitis C 09/26/2013  . Schizophrenia ([place]) 03/23/2013  . Essential hypertension, benign 03/23/2013  . Chronic kidney disease 03/23/2013    Past Surgical History:  Procedure Laterality Date  . A/V FISTULAGRAM N/A 08/06/2016   Procedure: A/V Fistulagram;  Surgeon: Algernon Huxley, MD;  Location: [place] CV LAB;  Service: Cardiovascular;  Laterality: N/A;  . A/V SHUNT INTERVENTION N/A 08/06/2016   Procedure: A/V Shunt Intervention;  Surgeon: Algernon Huxley, MD;  Location: [place] CV LAB;  Service: Cardiovascular;  Laterality: N/A;  . AV FISTULA PLACEMENT Left 12/26/2014   Procedure: ARTERIOVENOUS (AV) FISTULA CREATION;  Surgeon: Algernon Huxley, MD;  Location: [facility] ORS;  Service: Vascular;  Laterality: Left;  . ESOPHAGOGASTRODUODENOSCOPY (EGD) WITH PROPOFOL N/A 05/06/2017   Procedure: ESOPHAGOGASTRODUODENOSCOPY (EGD) WITH PROPOFOL;  Surgeon: Jonathon Bellows, MD;  Location: [facility] ENDOSCOPY;  Service: Gastroenterology;  Laterality: N/A;  . INSERTION OF DIALYSIS CATHETER Right   . PERIPHERAL VASCULAR CATHETERIZATION N/A 10/22/2014   Procedure: Dialysis/Perma Catheter Insertion;  Surgeon: Algernon Huxley, MD;  Location: [place] CV LAB;  Service: Cardiovascular;  Laterality: N/A;  . PERIPHERAL VASCULAR CATHETERIZATION N/A 02/28/2015   Procedure: Dialysis/Perma Catheter Removal;  Surgeon: Algernon Huxley, MD;  Location: [place] CV LAB;  Service: Cardiovascular;  Laterality: N/A;  . Repair fx left lower leg     yrs ago (age 7)    Prior to Admission medications   Medication Sig Start Date End Date Taking? Authorizing Provider  acetaminophen (TYLENOL) 500 MG tablet Take 500 mg by mouth every 4 (four) hours as needed for mild pain or moderate pain.     [provider]  albuterol (PROAIR HFA) 108 (90 Base) MCG/ACT inhaler Inhale 1-2 puffs into the lungs every 4 (four) hours as needed for wheezing or shortness of breath.     [provider]  amLODipine (NORVASC) 10 MG tablet Take  0.5 tablets (5 mg total) by mouth daily. 05/07/17   Henreitta Leber, MD  calcium acetate (PHOSLO) 667 MG capsule Take 2 capsules (1,334 mg total) by mouth 3 (three) times daily with meals. 05/19/15   Fritzi Mandes, MD  carbamazepine (TEGRETOL XR) 200 MG 12 hr tablet Take 1,000 mg by mouth every morning.    [provider]  cholecalciferol (VITAMIN D) 1000 UNITS tablet Take 1,000 Units by mouth daily.    [provider]  cloNIDine (CATAPRES) 0.1 MG tablet Take 1 tablet  (0.1 mg total) by mouth 2 (two) times daily. 05/07/17   Henreitta Leber, MD  cyclobenzaprine (FLEXERIL) 5 MG tablet Take 5 mg by mouth 2 (two) times daily as needed for muscle spasms.    [provider]  ferrous sulfate 325 (65 FE) MG tablet Take 1 tablet (325 mg total) by mouth daily. 05/19/15   Fritzi Mandes, MD  fluPHENAZine (PROLIXIN) 2.5 MG tablet Take 2.5 mg by mouth 2 (two) times daily.     [provider]  Fluticasone-Salmeterol (ADVAIR) 500-50 MCG/DOSE AEPB Inhale 1 puff into the lungs 2 (two) times daily.    [provider]  hydrALAZINE (APRESOLINE) 100 MG tablet Take 0.5 tablets (50 mg total) by mouth every 8 (eight) hours. 05/07/17   Henreitta Leber, MD  multivitamin (RENA-VIT) TABS tablet Take 1 tablet by mouth daily.    [provider]  mupirocin ointment (BACTROBAN) 2 % Place 1 application into the nose 2 (two) times daily. apply for 5 days 05/13/17   [name], MD  omeprazole (PRILOSEC) 40 MG capsule Take 1 capsule (40 mg total) by mouth daily. 05/06/17   Henreitta Leber, MD  traMADol (ULTRAM) 50 MG tablet Take 50 mg by mouth 2 (two) times daily.    [provider]  trihexyphenidyl (ARTANE) 2 MG tablet Take 2 mg by mouth 2 (two) times daily.    [provider]  vitamin C (VITAMIN C) 500 MG tablet 1 tab PO BID X 9 days and then start 1 tab daily. 05/06/17   Henreitta Leber, MD    Allergies Thorazine [chlorpromazine]  Family History  Problem Relation Age of Onset  . Diabetes Neg Hx     Social History Social History   Tobacco Use  . Smoking status: Current Every Day Smoker    Packs/day: 0.50    Types: Cigarettes  . Smokeless tobacco: Never Used  . Tobacco comment: 10 cigars a day  Substance Use Topics  . Alcohol use: No  . Drug use: No    Review of Systems  Constitutional: No fever/chills Eyes: No visual changes. ENT: No sore throat. Cardiovascular: Denies chest pain. Respiratory: shortness of breath.  Gastrointestinal: No abdominal pain.  No nausea, no vomiting.  No diarrhea.  No constipation. Genitourinary: Negative for dysuria. Musculoskeletal: Negative for back pain. Skin: Negative for rash. Neurological: Negative for headaches, focal weakness   ____________________________________________   PHYSICAL EXAM:  VITAL SIGNS: ED Triage Vitals  Enc Vitals Group     BP 09/11/18 2318 (!) 205/122     Pulse Rate 09/11/18 2318 (!) 129     Resp 09/11/18 2322 (!) 31     Temp --      Temp Source 09/11/18 2318 Axillary     SpO2 09/11/18 2318 100 %     Weight 09/11/18 2321 175 lb (79.4 kg)     Height 09/11/18 2321 6' (1.829 m)     Head Circumference --  Peak Flow --      Pain Score 09/11/18 2324 7     Pain Loc --      Pain Edu? --      Excl. in [place]? --     Constitutional: Alert and oriented.  Breathing hard tripoding and sweating. Eyes: Conjunctivae are normal.. Head: Atraumatic. Nose: No congestion/rhinnorhea. Mouth/Throat: Mucous membranes are moist.  Oropharynx non-erythematous. Neck: No stridor.  JVD to the angles cardiovascular: Normal rate, regular rhythm. Grossly normal heart sounds.  Good peripheral circulation. Respiratory: Normal respiratory effort.  No retractions. Lungs alcohol Gastrointestinal: Soft and nontender. No distention. No abdominal bruits. No CVA tenderness. Musculoskeletal: No lower extremity tenderness bilateral edema.  Neurologic:  Normal speech and language. No gross focal neurologic deficits are appreciated. Skin:  Skin is warm, dry and intact. No rash noted.   ____________________________________________   LABS (all labs ordered are listed, but only abnormal results are displayed)  Labs Reviewed  BRAIN NATRIURETIC PEPTIDE - Abnormal; Notable for the following components:      Result Value   B Natriuretic Peptide 1,176.0 (*)    All other components within normal limits  CBC WITH DIFFERENTIAL/PLATELET - Abnormal; Notable for the following  components:   WBC 13.6 (*)    RBC 3.09 (*)    Hemoglobin 10.6 (*)    HCT 31.6 (*)    MCV 102.3 (*)    MCH 34.3 (*)    Neutro Abs 11.9 (*)    All other components within normal limits  COMPREHENSIVE METABOLIC PANEL - Abnormal; Notable for the following components:   Potassium 5.5 (*)    Glucose, Bld 121 (*)    BUN 64 (*)    Creatinine, Ser 10.66 (*)    Alkaline Phosphatase 135 (*)    GFR calc non Af Amer 5 (*)    GFR calc Af Amer 6 (*)    All other components within normal limits  TROPONIN I - Abnormal; Notable for the following components:   Troponin I 0.05 (*)    All other components within normal limits   ____________________________________________  EKG  EKG #1 shows sinus tach at a rate of 123 rightward axis possible ST elevation in lead III only. EKG #2 shows sinus tachycardia at 126 rightward axis computer is now reading left posterior hemiblock.  No apparent ST elevation.  Computer is reading inferior depression I am not seeing that. ____________________________________________  RADIOLOGY  ED MD interpretation: Rest x-ray read by radiology reviewed by me shows pulmonary edema.  Official radiology report(s): Dg Chest Portable 1 View  Result Date: 09/11/2018 CLINICAL DATA:  Shortness of breath for 2 days EXAM: PORTABLE CHEST 1 VIEW COMPARISON:  05/06/2017 FINDINGS: Cardiac shadow is prominent. Increased vascular congestion is noted consistent with volume overload related to the missed dialysis therapy. Mild interstitial edema is noted. No focal infiltrate is seen. No effusion is noted. No bony abnormality is noted. IMPRESSION: Vascular congestion and interstitial edema consistent with volume overload. Electronically Signed   By: Inez Catalina M.D.   On: 09/11/2018 23:43    ____________________________________________   PROCEDURES  Procedure(s) performed (including Critical Care):  Procedures patient given 1 inch of Nitropaste by myself external jugular placed on the  left by myself blood drawn by the nurse then we give the patient 1.25 enalapril and 80 of Lasix.  Another inch of Nitropaste is placed.  5 mm metoprolol is placed.  At the end of this is patient's blood pressure is 173/99.  Heart rate is  97.  Patient still breathing hard but feels somewhat better. Critical care time 45 minutes.  This includes discussing the patient with our renal doctor and with the hospitalist.  ____________________________________________   INITIAL IMPRESSION / [place] / ED COURSE  Patient has not traveled and has no known exposure to coronavirus patients.  Still short of breath we will try get him on some BiPAP.  Once we get his labs back we will get him upstairs and plan on dialyzing him. ----------------------------------------- 1:04 AM on 09/12/2018 -----------------------------------------  Patient is now much better.  He is not working is hard to breathe.  Blood pressure still high.  Troponin is 0.05.  I do not see any old troponins and the results review section but this is not an consistent with his being a dialysis patient with a blood pressure is very high.  Likely from demand ischemia.             ____________________________________________   FINAL CLINICAL IMPRESSION(S) / ED DIAGNOSES  Final diagnoses:  Acute on chronic congestive heart failure, unspecified heart failure type [facility])  Acute respiratory distress  Hypertensive emergency     ED Discharge Orders    None       Note:  This document was prepared using Dragon voice recognition software and may include unintentional dictation errors.    Nena Polio, MD 09/12/18 [phone]

## 2018-09-12 NOTE — Consult Note (Signed)
Cardiology Consultation Note    Patient ID: Todd Brown, MRN: [id number], DOB/AGE: Jun 11, 1960 58 y.o. Admit date: 09/11/2018   Date of Consult: 09/12/2018 Primary Physician: System, Pcp Not In Primary Cardiologist: Dr. Clayborn Bigness  Chief Complaint: sob Reason for Consultation: Todd Brown Requesting MD: Dr. Margaretmary Eddy  HPI: Todd Brown is a 58 y.o. male with history of end-stage renal disease on hemodialysis on Monday Wednesday and Friday, type II diabetes mellitus, schizophrenia,  anemia of chronic disease and hypertension, who presented to the emergency room with acute onset dyspnea that started yesterday with associated dry cough and occasional wheezing with no fever or chills.  He has been having orthopnea and paroxysmal nocturnal dyspnea as well as dyspnea on exertion with significantly worsening bilateral lower extremity edema. Troponin was mildly elevated at 0.05 with atypical chest pain. EKG showed nsr with tall T waves but no significant st elevation or depression. K was 5.5, bun/cr was 64/10.66. CXR revealed vascular congeston and interstitial edema.  Very poor historian. Has preserved lv funciton by echo in 2018 with moderate increased right sided pressures. He denies missing meds or HD> He wa hypertensive on presentation and was given vasotec, hydralazine and lasix in er and subsequently developed svt treated with iv cardizem.  Patient currently is on a BiPAP mask.  He denies chest pain.  He states that he does have significant shortness of breath.  Past Medical History:  Diagnosis Date  . Anemia   . Diabetes mellitus without complication ([place])   . Dialysis patient [facility])    Mon. -Wed.- Fri  . DVT (deep venous thrombosis) ([place])    cephalic and basolic vein thrombosis  . ESRD (end stage renal disease) ([place])   . Hyperlipidemia   . Malignant hypertension   . Renal artery stenosis ([place])   . Schizophrenia [facility])       Surgical History:  Past Surgical History:  Procedure Laterality Date   . A/V FISTULAGRAM N/A 08/06/2016   Procedure: A/V Fistulagram;  Surgeon: Algernon Huxley, MD;  Location: [place] CV LAB;  Service: Cardiovascular;  Laterality: N/A;  . A/V SHUNT INTERVENTION N/A 08/06/2016   Procedure: A/V Shunt Intervention;  Surgeon: Algernon Huxley, MD;  Location: [place] CV LAB;  Service: Cardiovascular;  Laterality: N/A;  . AV FISTULA PLACEMENT Left 12/26/2014   Procedure: ARTERIOVENOUS (AV) FISTULA CREATION;  Surgeon: Algernon Huxley, MD;  Location: [facility] ORS;  Service: Vascular;  Laterality: Left;  . ESOPHAGOGASTRODUODENOSCOPY (EGD) WITH PROPOFOL N/A 05/06/2017   Procedure: ESOPHAGOGASTRODUODENOSCOPY (EGD) WITH PROPOFOL;  Surgeon: Jonathon Bellows, MD;  Location: [facility] ENDOSCOPY;  Service: Gastroenterology;  Laterality: N/A;  . INSERTION OF DIALYSIS CATHETER Right   . PERIPHERAL VASCULAR CATHETERIZATION N/A 10/22/2014   Procedure: Dialysis/Perma Catheter Insertion;  Surgeon: Algernon Huxley, MD;  Location: [place] CV LAB;  Service: Cardiovascular;  Laterality: N/A;  . PERIPHERAL VASCULAR CATHETERIZATION N/A 02/28/2015   Procedure: Dialysis/Perma Catheter Removal;  Surgeon: Algernon Huxley, MD;  Location: [place] CV LAB;  Service: Cardiovascular;  Laterality: N/A;  . Repair fx left lower leg     yrs ago (age 80)     Home Meds: Prior to Admission medications   Medication Sig Start Date End Date Taking? Authorizing Provider  acetaminophen (TYLENOL) 500 MG tablet Take 500 mg by mouth every 4 (four) hours as needed for mild pain or moderate pain.     [provider]  albuterol (PROAIR HFA) 108 (90 Base) MCG/ACT inhaler Inhale 1-2 puffs into  the lungs every 4 (four) hours as needed for wheezing or shortness of breath.     [provider]  amLODipine (NORVASC) 10 MG tablet Take 0.5 tablets (5 mg total) by mouth daily. 05/07/17   Henreitta Leber, MD  calcium acetate (PHOSLO) 667 MG capsule Take 2 capsules (1,334 mg total) by mouth 3 (three) times daily with meals.  05/19/15   Fritzi Mandes, MD  carbamazepine (TEGRETOL XR) 200 MG 12 hr tablet Take 1,000 mg by mouth every morning.    [provider]  cholecalciferol (VITAMIN D) 1000 UNITS tablet Take 1,000 Units by mouth daily.    [provider]  cloNIDine (CATAPRES) 0.1 MG tablet Take 1 tablet (0.1 mg total) by mouth 2 (two) times daily. 05/07/17   Henreitta Leber, MD  cyclobenzaprine (FLEXERIL) 5 MG tablet Take 5 mg by mouth 2 (two) times daily as needed for muscle spasms.    [provider]  ferrous sulfate 325 (65 FE) MG tablet Take 1 tablet (325 mg total) by mouth daily. 05/19/15   Fritzi Mandes, MD  fluPHENAZine (PROLIXIN) 2.5 MG tablet Take 2.5 mg by mouth 2 (two) times daily.     [provider]  Fluticasone-Salmeterol (ADVAIR) 500-50 MCG/DOSE AEPB Inhale 1 puff into the lungs 2 (two) times daily.    [provider]  hydrALAZINE (APRESOLINE) 100 MG tablet Take 0.5 tablets (50 mg total) by mouth every 8 (eight) hours. 05/07/17   Henreitta Leber, MD  multivitamin (RENA-VIT) TABS tablet Take 1 tablet by mouth daily.    [provider]  mupirocin ointment (BACTROBAN) 2 % Place 1 application into the nose 2 (two) times daily. apply for 5 days 05/13/17   [name], MD  omeprazole (PRILOSEC) 40 MG capsule Take 1 capsule (40 mg total) by mouth daily. 05/06/17   Henreitta Leber, MD  traMADol (ULTRAM) 50 MG tablet Take 50 mg by mouth 2 (two) times daily.    [provider]  trihexyphenidyl (ARTANE) 2 MG tablet Take 2 mg by mouth 2 (two) times daily.    [provider]  vitamin C (VITAMIN C) 500 MG tablet 1 tab PO BID X 9 days and then start 1 tab daily. 05/06/17   Henreitta Leber, MD    Inpatient Medications:  . amLODipine  5 mg Oral Daily  . aspirin EC  81 mg Oral Daily  . calcium acetate  1,334 mg Oral TID WC  . carbamazepine  1,000 mg Oral [id number]  . cholecalciferol  1,000 Units Oral Daily  . cloNIDine  0.1 mg Oral BID  . ferrous  sulfate  325 mg Oral Daily  . fluPHENAZine  2.5 mg Oral BID  . heparin  5,000 Units Subcutaneous Q8H  . hydrALAZINE  50 mg Oral Q8H  . insulin aspart  0-5 Units Subcutaneous QHS  . insulin aspart  0-9 Units Subcutaneous TID WC  . multivitamin  1 tablet Oral Daily  . nitroGLYCERIN  1 inch Topical Q8H  . pantoprazole  40 mg Oral Daily  . sodium chloride flush  3 mL Intravenous Q12H  . trihexyphenidyl  2 mg Oral BID  . ascorbic acid  500 mg Oral Daily   . sodium chloride      Allergies:  Allergies  Allergen Reactions  . Thorazine [Chlorpromazine] Other (See Comments)    Reaction:  Unknown , pt states it makes him feel real bad    Social History   Socioeconomic History  . Marital  status: Single    Spouse name: Not on file  . Number of children: Not on file  . Years of education: Not on file  . Highest education level: Not on file  Occupational History  . Not on file  Social Needs  . Financial resource strain: Not on file  . Food insecurity:    Worry: Not on file    Inability: Not on file  . Transportation needs:    Medical: Not on file    Non-medical: Not on file  Tobacco Use  . Smoking status: Current Every Day Smoker    Packs/day: 0.50    Types: Cigarettes  . Smokeless tobacco: Never Used  . Tobacco comment: 10 cigars a day  Substance and Sexual Activity  . Alcohol use: No  . Drug use: No  . Sexual activity: Not on file  Lifestyle  . Physical activity:    Days per week: Not on file    Minutes per session: Not on file  . Stress: Not on file  Relationships  . Social connections:    Talks on phone: Not on file    Gets together: Not on file    Attends religious service: Not on file    Active member of club or organization: Not on file    Attends meetings of clubs or organizations: Not on file    Relationship status: Not on file  . Intimate partner violence:    Fear of current or ex partner: Not on file    Emotionally abused: Not on file    Physically  abused: Not on file    Forced sexual activity: Not on file  Other Topics Concern  . Not on file  Social History Narrative  . Not on file     Family History  Problem Relation Age of Onset  . Diabetes Neg Hx      Review of Systems: A 12-system review of systems was performed and is negative except as noted in the HPI.  Labs: Recent Labs    09/12/18 0029 09/12/18 0501  CKTOTAL  --  218  TROPONINI 0.05*  --    Lab Results  Component Value Date   WBC 13.6 (H) 09/11/2018   HGB 10.6 (L) 09/11/2018   HCT 31.6 (L) 09/11/2018   MCV 102.3 (H) 09/11/2018   PLT 191 09/11/2018    Recent Labs  Lab 09/12/18 0029 09/12/18 0501  NA 139 137  K 5.5* 6.3*  CL 102 101  CO2 24 23  BUN 64* 68*  CREATININE 10.66* 10.93*  CALCIUM 9.1 9.3  PROT 7.7  --   BILITOT 1.0  --   ALKPHOS 135*  --   ALT 24  --   AST 30  --   GLUCOSE 121* 123*   Lab Results  Component Value Date   TRIG 106 08/03/2016   No results found for: DDIMER  Radiology/Studies:  Dg Chest Portable 1 View  Result Date: 09/11/2018 CLINICAL DATA:  Shortness of breath for 2 days EXAM: PORTABLE CHEST 1 VIEW COMPARISON:  05/06/2017 FINDINGS: Cardiac shadow is prominent. Increased vascular congestion is noted consistent with volume overload related to the missed dialysis therapy. Mild interstitial edema is noted. No focal infiltrate is seen. No effusion is noted. No bony abnormality is noted. IMPRESSION: Vascular congestion and interstitial edema consistent with volume overload. Electronically Signed   By: Inez Catalina M.D.   On: 09/11/2018 23:43    Wt Readings from Last 3 Encounters:  09/12/18 79.1 kg  05/12/17 86.4 kg  10/20/16 80.3 kg    EKG: nsr with interventricular conduction delay and tall T waves.   Physical Exam:Poor historian Blood pressure (!) 146/87, pulse 88, temperature (!) 97.5 F (36.4 C), temperature source Axillary, resp. rate (!) 21, height 6\' 3"  (1.905 m), weight 79.1 kg, SpO2 94 %. Body mass  index is 21.8 kg/m. General: Well developed, well nourished, in no acute distress. Head: Normocephalic, atraumatic, sclera non-icteric, no xanthomas, nares are without discharge.  Neck: Negative for carotid bruits. JVD not elevated. Lungs: Clear bilaterally to auscultation without wheezes, rales, or rhonchi. Breathing is unlabored. Heart: RRR with S1 S2. No murmurs, rubs, or gallops appreciated. Abdomen: Soft, non-tender, non-distended with normoactive bowel sounds. No hepatomegaly. No rebound/guarding. No obvious abdominal masses. Msk:  Strength and tone appear normal for age. Extremities: No clubbing or cyanosis. No edema.  Distal pedal pulses are 2+ and equal bilaterally. Neuro: Alert and oriented.  Nonfocal.      Assessment and Plan  Patient is a 58 year old male with history of end-stage renal disease on hemodialysis Monday Wednesday Friday, type 2 diabetes, history of schizophrenia, anemia of chronic disease and hypertension who presented to the ER complaints of several days of shortness of breath and wheezing with no fever or chills.  He had orthopnea and PND.  He denied any sick contacts.  He is unaware of fever.  He is a difficult historian however states he has been compliant with medications and presented for his usual dialysis session on Friday.  Chest x-ray shows evidence of pulmonary edema.  Mildly elevated serum troponin consistent with his renal disease.  Congestive heart failure-patient had preserved LV function by echo in 2018.  Likely diastolic dysfunction.  He will dialyze today.  We will follow-up his blood pressure and chest x-ray response to hemodialysis.  Would continue use Lasix as needed.  Elevated troponin-thus far appears to be demand ischemia in the face of renal insufficiency.  No obvious ischemic change on electrocardiogram.  Will follow.  Diastolic dysfunction-echo is pending.  Will evaluate for evidence of change from baseline.  Further recommendations based on  the results.  Hypertension-continue with current regimen.  Teodoro Spray MD 09/12/2018, 9:11 AM Pager: [phone]

## 2018-09-12 NOTE — Plan of Care (Signed)
Pt on 2L / tolerated well.[place] Kitchen

## 2018-09-12 NOTE — ED Notes (Signed)
Pt's HR dec to 80's then inc again to 180 post cardizem; currently in 80s sustained.

## 2018-09-12 NOTE — ED Notes (Signed)
Pt suddenly 213 tachy while MD at bedside; cardizem 20 verb order given immediately.

## 2018-09-12 NOTE — Progress Notes (Signed)
HD Tx Start     09/12/18 0945  Vital Signs  Temp (!) 97.4 F (36.3 C)  Temp Source Axillary  Pulse Rate 97  Pulse Rate Source Monitor  Resp 18  BP (!) 155/106  BP Location Right Arm  BP Method Automatic  Patient Position (if appropriate) Lying  Oxygen Therapy  SpO2 96 %  O2 Device Bi-PAP  FiO2 (%) 40 %  During Hemodialysis Assessment  Blood Flow Rate (mL/min) 400 mL/min  Arterial Pressure (mmHg) -160 mmHg  Venous Pressure (mmHg) 180 mmHg  Transmembrane Pressure (mmHg) 60 mmHg  Ultrafiltration Rate (mL/min) 875 mL/min (875 mL per HOUR removed)  Dialysate Flow Rate (mL/min) 800 ml/min  Conductivity: Machine  14  HD Safety Checks Performed Yes  Dialysis Fluid Bolus Normal Saline  Bolus Amount (mL) 250 mL  Intra-Hemodialysis Comments Tx initiated

## 2018-09-12 NOTE — H&P (Addendum)
[facility] [place] NAME: Todd Brown    MR#:  [id number]  DATE OF BIRTH:  1960/09/03  DATE OF ADMISSION:  09/11/2018  PRIMARY CARE PHYSICIAN: System, Pcp Not In   REQUESTING/REFERRING PHYSICIAN: Conni Slipper, MD CHIEF COMPLAINT:   Chief Complaint  Patient presents with  . Shortness of Breath    HISTORY OF PRESENT ILLNESS:  Todd Brown  is a 58 y.o. male with a known history of end-stage renal disease on hemodialysis on Monday Wednesday and Friday, type II diabetes mellitus, anemia of chronic disease and hypertension, who presented to the emergency room with acute onset dyspnea that started yesterday with associated dry cough and occasional wheezing with no fever or chills.  He has been having orthopnea and paroxysmal nocturnal dyspnea as well as dyspnea on exertion with significantly worsening bilateral lower extremity edema.  He admitted to chest pain during my interview without palpitations.  No nausea or vomiting or abdominal pain.  He described his chest pain as tightness and it has been moderate in intensity.  No reported recent sick exposure or travels.  The patient was a fairly poor historian as he was on BiPAP and possibly due to his schizophrenia.  Upon presentation to the emergency room his blood pressure was significantly elevated at 220/125 with a heart rate of 125, respiratory to 33 pulse oximetry of 100% on BiPAP that was placed initially due to his significant respiratory distress.  Labs were remarkable for potassium of 5.5 and a BUN of 64 with a creatinine of 10.66, troponin I 0.05 and BNP of 1176 with hemoglobin of 10.6 and hematocrit 31.6 that are better than previous values.  He EKG showed sinus tachycardia with left posterior fascicular block and poor R wave progression.  His most recent 2D echo showed an EF of 60 to 65% with mild atrial regurgitation and trivial mitral regurgitation with left atrial dilatation and  moderate dilatation of the right atrium on 05/02/2017.  Tonight his portable chest x-ray revealed volume overload with interstitial pulmonary edema.  He did not miss his hemodialysis on Friday.  He has been taking his regular medications.  In the ER he was given 1.25 mg of IV Vasotec, 20 mg of IV Lasix, 5 mg of IV hydralazine and 5 mg of IV Lopressor twice as well as an inch of Nitropaste.  His blood pressure was still elevated at 190/119 and during my interview 171/108.  He went into SVT with a heart rate that was up to 213 during my interview.  I did carotid massage that did not help and ordered 20 mg of IV Cardizem that slowed him down to the 95 then he went up again 270 then settled down at 88.  A repeat EKG showed sinus rhythm with a rate of 88 with poor R wave progression in 2 of inversion in V1 and V2 as well as aVL with mildly peaked T waves.  The patient will be admitted to a stepdown bed for further evaluation and management. PAST MEDICAL HISTORY:   Past Medical History:  Diagnosis Date  . Anemia   . Diabetes mellitus without complication ([place])   . Dialysis patient [facility])    Mon. -Wed.- Fri  . DVT (deep venous thrombosis) ([place])    cephalic and basolic vein thrombosis  . ESRD (end stage renal disease) ([place])   . Hyperlipidemia   . Malignant hypertension   . Renal artery stenosis ([place])   . Schizophrenia ([place])  PAST SURGICAL HISTORY:   Past Surgical History:  Procedure Laterality Date  . A/V FISTULAGRAM N/A 08/06/2016   Procedure: A/V Fistulagram;  Surgeon: Algernon Huxley, MD;  Location: [place] [facility];  Service: Cardiovascular;  Laterality: N/A;  . A/V SHUNT INTERVENTION N/A 08/06/2016   Procedure: A/V Shunt Intervention;  Surgeon: Algernon Huxley, MD;  Location: [place] [facility];  Service: Cardiovascular;  Laterality: N/A;  . AV FISTULA PLACEMENT Left 12/26/2014   Procedure: ARTERIOVENOUS (AV) FISTULA CREATION;  Surgeon: Algernon Huxley, MD;  Location: [facility] ORS;  Service: Vascular;   Laterality: Left;  . ESOPHAGOGASTRODUODENOSCOPY (EGD) WITH PROPOFOL N/A 05/06/2017   Procedure: ESOPHAGOGASTRODUODENOSCOPY (EGD) WITH PROPOFOL;  Surgeon: Jonathon Bellows, MD;  Location[facility] ENDOSCOPY;  Service: Gastroenterology;  Laterality: N/A;  . INSERTION OF DIALYSIS CATHETER Right   . PERIPHERAL VASCULAR CATHETERIZATION N/A 10/22/2014   Procedure: Dialysis/Perma Catheter Insertion;  Surgeon: Algernon Huxley, MD;  Location: [place] [facility];  Service: Cardiovascular;  Laterality: N/A;  . PERIPHERAL VASCULAR CATHETERIZATION N/A 02/28/2015   Procedure: Dialysis/Perma Catheter Removal;  Surgeon: Algernon Huxley, MD;  Location: [place] [facility];  Service: Cardiovascular;  Laterality: N/A;  . Repair fx left lower leg     yrs ago (age 52)    SOCIAL HISTORY:   Social History   Tobacco Use  . Smoking status: Current Every Day Smoker    Packs/day: 0.50    Types: Cigarettes  . Smokeless tobacco: Never Used  . Tobacco comment: 10 cigars a day  Substance Use Topics  . Alcohol use: No    FAMILY HISTORY:   Family History  Problem Relation Age of Onset  . Diabetes Neg Hx     DRUG ALLERGIES:   Allergies  Allergen Reactions  . Thorazine [Chlorpromazine] Other (See Comments)    Reaction:  Unknown , pt states it makes him feel real bad    REVIEW OF SYSTEMS:   ROS As per history of present illness. All pertinent systems were reviewed above. Constitutional,  HEENT, cardiovascular, respiratory, GI, GU, musculoskeletal, neuro, psychiatric, endocrine,  integumentary and hematologic systems were reviewed and are otherwise  negative/unremarkable except for positive findings mentioned above in the HPI.   MEDICATIONS AT HOME:   Prior to Admission medications   Medication Sig Start Date End Date Taking? Authorizing Provider  acetaminophen (TYLENOL) 500 MG tablet Take 500 mg by mouth every 4 (four) hours as needed for mild pain or moderate pain.     [provider]  albuterol (PROAIR  HFA) 108 (90 Base) MCG/ACT inhaler Inhale 1-2 puffs into the lungs every 4 (four) hours as needed for wheezing or shortness of breath.     [provider]  amLODipine (NORVASC) 10 MG tablet Take 0.5 tablets (5 mg total) by mouth daily. 05/07/17   Henreitta Leber, MD  calcium acetate (PHOSLO) 667 MG capsule Take 2 capsules (1,334 mg total) by mouth 3 (three) times daily with meals. 05/19/15   Fritzi Mandes, MD  carbamazepine (TEGRETOL XR) 200 MG 12 hr tablet Take 1,000 mg by mouth every morning.    [provider]  cholecalciferol (VITAMIN D) 1000 UNITS tablet Take 1,000 Units by mouth daily.    [provider]  cloNIDine (CATAPRES) 0.1 MG tablet Take 1 tablet (0.1 mg total) by mouth 2 (two) times daily. 05/07/17   Henreitta Leber, MD  cyclobenzaprine (FLEXERIL) 5 MG tablet Take 5 mg by mouth 2 (two) times daily as needed for muscle  spasms.    [provider]  ferrous sulfate 325 (65 FE) MG tablet Take 1 tablet (325 mg total) by mouth daily. 05/19/15   Fritzi Mandes, MD  fluPHENAZine (PROLIXIN) 2.5 MG tablet Take 2.5 mg by mouth 2 (two) times daily.     [provider]  Fluticasone-Salmeterol (ADVAIR) 500-50 MCG/DOSE AEPB Inhale 1 puff into the lungs 2 (two) times daily.    [provider]  hydrALAZINE (APRESOLINE) 100 MG tablet Take 0.5 tablets (50 mg total) by mouth every 8 (eight) hours. 05/07/17   Henreitta Leber, MD  multivitamin (RENA-VIT) TABS tablet Take 1 tablet by mouth daily.    [provider]  mupirocin ointment (BACTROBAN) 2 % Place 1 application into the nose 2 (two) times daily. apply for 5 days 05/13/17   [name], MD  omeprazole (PRILOSEC) 40 MG capsule Take 1 capsule (40 mg total) by mouth daily. 05/06/17   Henreitta Leber, MD  traMADol (ULTRAM) 50 MG tablet Take 50 mg by mouth 2 (two) times daily.    [provider]  trihexyphenidyl (ARTANE) 2 MG tablet Take 2 mg by mouth 2 (two) times daily.    [provider]  vitamin C (VITAMIN C) 500 MG tablet 1 tab PO BID X 9 days and then start 1 tab daily. 05/06/17   Henreitta Leber, MD      VITAL SIGNS:  Blood pressure (!) 155/100, pulse 90, temperature 98 F (36.7 C), temperature source Oral, resp. rate (!) 25, height 6' (1.829 m), weight 79.4 kg, SpO2 96 %.  PHYSICAL EXAMINATION:  Physical Exam  GENERAL: Acutely ill looking 58 y.o.-year-old African-American male patient lying in the bed with respiratory distress on BiPAP. EYES: Pupils equal, round, reactive to light and accommodation. No scleral icterus. Extraocular muscles intact.  HEENT: Head atraumatic, normocephalic. Oropharynx and nose: BiPAP mask in place. NECK:  Supple, no jugular venous distention. No thyroid enlargement, no tenderness.  LUNGS: Diminished bibasilar breath sounds with bibasilar and midlung zone crackles CARDIOVASCULAR: Tachycardic to 200 with regular rhythm with occasional extra beats S1, S2 normal. No murmurs, rubs, or gallops.  ABDOMEN: Soft, nontender, nondistended. Bowel sounds present. No organomegaly or mass.  EXTREMITIES: 2-3+ bilateral lower extremity pitting lower extremity and pedal edema, with no cyanosis, or clubbing.  NEUROLOGIC: Cranial nerves II through XII are intact. Muscle strength 5/5 in all extremities. Sensation intact. Gait not checked.  PSYCHIATRIC: The patient is alert and oriented x 3.  SKIN: No obvious rash, lesion, or ulcer.   LABORATORY PANEL:   CBC Recent Labs  Lab 09/11/18 2341  WBC 13.6*  HGB 10.6*  HCT 31.6*  PLT 191   ------------------------------------------------------------------------------------------------------------------  Chemistries  Recent Labs  Lab 09/12/18 0029  NA 139  K 5.5*  CL 102  CO2 24  GLUCOSE 121*  BUN 64*  CREATININE 10.66*  CALCIUM 9.1  AST 30  ALT 24  ALKPHOS 135*  BILITOT 1.0    ------------------------------------------------------------------------------------------------------------------  Cardiac Enzymes Recent Labs  Lab 09/12/18 0029  TROPONINI 0.05*   ------------------------------------------------------------------------------------------------------------------  RADIOLOGY:  Dg Chest Portable 1 View  Result Date: 09/11/2018 CLINICAL DATA:  Shortness of breath for 2 days EXAM: PORTABLE CHEST 1 VIEW COMPARISON:  05/06/2017 FINDINGS: Cardiac shadow is prominent. Increased vascular congestion is noted consistent with volume overload related to the missed dialysis therapy. Mild interstitial edema is noted. No focal infiltrate is seen. No effusion is noted. No bony abnormality is noted. IMPRESSION: Vascular congestion and interstitial edema consistent  with volume overload. Electronically Signed   By: Inez Catalina M.D.   On: 09/11/2018 23:43      IMPRESSION AND PLAN:   #1.  Acute hypoxic respiratory failure secondary to acute pulmonary edema likely due to luid overload associated with end-stage renal disease and possibly acute new onset diastolic CHF that could be exacerbated by a hypertensive urgency/emergency.  The patient will be admitted to an stepdown unit.  We initially ordered IV Cardene drip however the blood pressure is come down to 824 systolic after IV Cardizem bolus.  For now, will monitor first before IV Cardizem drip and we can use PRN IV hydralazine and labetalol.  He will have serial cardiac enzymes as well as a 2D echo and a cardiology consultation by Dr. Nehemiah Massed who was contacted about the patient.  I also discussed the case with the E. Link intensivist Dr. Prudencio Burly.  2.  Supraventricular tachycardia.  The patient's heart rate went to 213 and after 20 mg of IV Cardizem that I ordered it came down and settled in the 80s with normal sinus rhythm.  We will continue to monitor him in the stepdown unit.  Should need be we will initiate IV Cardizem drip  that should help with his blood pressure as well.  Serial cardiac enzymes and 2D echo will be obtained as mentioned above.   3.  Hypertensive urgency.  The patient will be continued on his antihypertensives.  His p.o. clonidine can be increased to 0.2 mg p.o. nightly hours from current dose of 0.1 mg p.o. twice daily.  We will continue his hydralazine and amlodipine.  He will be on PRN IV hydralazine and labetalol as mentioned above.  If need be he can be started on IV Cardene drip should his blood pressure go higher after current improvement.  This is likely the culprit for his acute CHF and pulmonary edema.   4.  End-stage renal disease on hemodialysis on Monday Wednesday Friday with associated hyperkalemia.  We ordered 15 g of p.o. Kayexalate.  This should correct with hemodialysis in a.m.  Nephrology consultation will be obtained by Dr. Juleen China who was notified about the patient in the ER.  The patient is due for hemodialysis tomorrow.  5.  Type 2 diabetes mellitus.  He will be placed on supplemental coverage with NovoLog.  6.  Ongoing tobacco abuse.  He continues to smoke 1 pack of cigarettes per day.  I counseled him for smoking cessation and he will receive further counseling here.  7.  Schizophrenia.  His psychotropic medications will be continued.  8.  DVT prophylaxis.  This will be covered with subcutaneous heparin   All the records are reviewed and case discussed with ED provider.  The plan of care was discussed in details with the patient (and family). I answered all questions. The patient agreed to proceed with the above mentioned plan. Further management will depend upon hospital course.  This dictation has been created by [employer].  Any transcription errors that resulted from this process are unintentional.  CODE STATUS: Full code   TOTAL CRITICAL CARE TIME TAKING CARE OF THIS PATIENT: 63minutes.     Christel Mormon M.D on 09/12/2018 at 2:49  AM  Pager - [phone]  After 6pm go to [email or web address] - Proofreader  [facility]  Office  [phone]  CC: Primary care physician; System, Pcp Not In   Note: This dictation was prepared with Dragon dictation along with  smaller phrase technology. Any transcriptional errors that result from this process are unintentional. 

## 2018-09-12 NOTE — Progress Notes (Signed)
eLink Physician-Brief Progress Note Patient Name: Todd Brown DOB: 02-Jun-1960 MRN: [id number]   Date of Service  09/12/2018  HPI/Events of Note  58 yr old male with hx of ESRD on HD, DM, chronic anemia, smoker/Asthma vs COPD admitted for 1. AHRF from Pulmonary edema due to Hypertensive mergency, 2). SVT converted to sinus post cardizem push. 30 ESRD, missed HD. Last one on Friday.  Data: Reviewed CxR CHF. EKG: T down in V,2, aVL.   Camera: assessment done. On BiPAP 05/07/11/40%. RR still at > 16. Tolerating BiPAP. 167/90, HR 108, sinus tach. sats OK.   Discussed with Hospitalist.   eICU Interventions  - continue care - asp precautions - 2D echo in AM. Follow troponin levels, first normal. - on SSI for DM, goal < 180 - on SQ heparin as VTE - Nephrology for HD. - prn ABG for hypoxemia. co2 at 24.  - do not decrease SBP or MAP > 20% from ED BP. On cardene gtt.  - follow potassium in AM - hold ACEI. - mild reactive leukocytosis. - no fever or travel history as per ED notes. .      Intervention Category Major Interventions: Respiratory failure - evaluation and management;Arrhythmia - evaluation and management;Hyperglycemia - active titration of insulin therapy;Other: Evaluation Type: New Patient Evaluation  [name] 09/12/2018, 3:28 AM

## 2018-09-12 NOTE — Progress Notes (Signed)
CRITICAL VALUE ALERT  Critical Value:  Troponin 0.32  Date & Time Notied:  09/12/2018 at 1159  Provider Notified: Dr. Alva Garnet  Orders Received/Actions taken: none at this time  [facility]

## 2018-09-12 NOTE — Progress Notes (Signed)
Pre HD Tx   09/12/18 0930  Vital Signs  Pulse Rate (!) 103  Resp 19  BP (!) 160/106  Oxygen Therapy  SpO2 96 %  O2 Device Bi-PAP  FiO2 (%) 40 %  Pain Assessment  Pain Scale 0-10  Pain Score 0  Dialysis Weight  Weight 79.1 kg  Type of Weight Pre-Dialysis  Time-Out for Hemodialysis  What Procedure? hemodialysis   Pt Identifiers(min of two) First/Last Name;MRN/Account#  Correct Site? Yes  Correct Side? Yes  Correct Procedure? Yes  Consents Verified? Yes  Rad Studies Available? N/A  Safety Precautions Reviewed? Yes  Engineer, civil (consulting) Number 2  Station Number  (ICU 17)  UF/Alarm Test Passed  Conductivity: Meter 14  Conductivity: Machine  14  pH 7.2  Reverse Osmosis WRO 3  Normal Saline Lot Number [id number]  Dialyzer Lot Number [id number]  Disposable Set Lot Number [id number]  Machine Temperature 98.6 F (37 C)  Musician and Audible Yes  Blood Lines Intact and Secured Yes  Pre Treatment Patient Checks  Vascular access used during treatment Fistula  Hepatitis B Surface Antigen Results Negative  Date Hepatitis B Surface Antigen Drawn 10/12/17  Hepatitis B Surface Antibody  (>10)  Date Hepatitis B Surface Antibody Drawn 03/18/18  Hemodialysis Consent Verified Yes  Hemodialysis Standing Orders Initiated Yes  ECG (Telemetry) Monitor On Yes  Prime Ordered Normal Saline  Length of  DialysisTreatment -hour(s) 3.5 Hour(s)  Dialysis Treatment Comments Na 140  Dialyzer Elisio 17H NR  Dialysate 2K, 2.5 Ca  Dialysate Flow Ordered 600  Blood Flow Rate Ordered 400 mL/min  Ultrafiltration Goal 3.5 Liters  Dialysis Blood Pressure Support Ordered Normal Saline  Education / Care Plan  Dialysis Education Provided Yes  Documented Education in Care Plan Yes  Fistula / Graft Left Upper arm Arteriovenous fistula  No Placement Date or Time found.   Orientation: Left  Access Location: Upper arm  Access Type: Arteriovenous fistula  Site Condition No complications  Fistula /  Graft Assessment Present;Thrill;Bruit  Status Accessed  Needle Size 15  Drainage Description None

## 2018-09-12 NOTE — Progress Notes (Signed)
Post HD Assessment  Removed 4 liters, pt tolerated tx well. Transitioned off BiPap, to 6L nasal cannula during tx. Pt states feeling much better.     09/12/18 1320  Neurological  Level of Consciousness Alert  Orientation Level Oriented X4  Respiratory  Respiratory Pattern Regular  Chest Assessment Chest expansion symmetrical  R Upper  Breath Sounds Clear  L Upper Breath Sounds Clear  R Lower Breath Sounds Expiratory wheezes  L Lower Breath Sounds Expiratory wheezes  Cardiac  ECG Monitor Yes  Cardiac Rhythm ST;BBB  Vascular  R Radial Pulse +2  L Radial Pulse +2  Edema Right lower extremity  RLE Edema +2  LLE Edema +2  Integumentary  Integumentary (WDL) X  Skin Condition Dry  Musculoskeletal  Musculoskeletal (WDL) X  Generalized Weakness Yes  Gastrointestinal  Bowel Sounds Assessment Active  GU Assessment  Genitourinary (WDL) X  Genitourinary Symptoms  (HD Pt)  Psychosocial  Psychosocial (WDL) WDL

## 2018-09-12 NOTE — Consult Note (Signed)
PULMONARY / CRITICAL CARE MEDICINE  Name: Todd Brown MRN: [id number] DOB: March 16, 1961    LOS: 0  Referring Provider: Dr. Sidney Ace Reason for Referral: Acute hypoxic respiratory failure, SVT and acute pulmonary edema  HPI: This is a 58 year old male with a medical history as indicated below who presented to the ED with acute shortness of breath.  History is obtained predominantly from ED records as patient is on continuous BiPAP and and dyspneic.  Per ED records, EMS was called due to progressive shortness of breath that started on Saturday and progressively got worse.  Dyspnea is associated with chest pain, cough, wheezing and lower extremity edema.  At the ED, his systolic blood pressure was in the 200s.  His chest x-ray showed profound pulmonary edema.  He was placed on continuous BiPAP and admitted to the ICU for further management.  The ED, patient developed SVT requiring IV diltiazem.  Heart rate has improved and currently normal sinus rhythm at 80 bpm  Patient goes to dialysis Mondays, Wednesdays and Fridays and states that he has been compliant with dialysis and his medications.  Past Medical History:  Diagnosis Date  . Anemia   . Diabetes mellitus without complication ([place])   . Dialysis patient [facility])    Mon. -Wed.- Fri  . DVT (deep venous thrombosis) ([place])    cephalic and basolic vein thrombosis  . ESRD (end stage renal disease) ([place])   . Hyperlipidemia   . Malignant hypertension   . Renal artery stenosis ([place])   . Schizophrenia [facility])    Past Surgical History:  Procedure Laterality Date  . A/V FISTULAGRAM N/A 08/06/2016   Procedure: A/V Fistulagram;  Surgeon: Algernon Huxley, MD;  Location: [place] CV LAB;  Service: Cardiovascular;  Laterality: N/A;  . A/V SHUNT INTERVENTION N/A 08/06/2016   Procedure: A/V Shunt Intervention;  Surgeon: Algernon Huxley, MD;  Location: [place] CV LAB;  Service: Cardiovascular;  Laterality: N/A;  . AV FISTULA PLACEMENT Left 12/26/2014    Procedure: ARTERIOVENOUS (AV) FISTULA CREATION;  Surgeon: Algernon Huxley, MD;  Location: [facility] ORS;  Service: Vascular;  Laterality: Left;  . ESOPHAGOGASTRODUODENOSCOPY (EGD) WITH PROPOFOL N/A 05/06/2017   Procedure: ESOPHAGOGASTRODUODENOSCOPY (EGD) WITH PROPOFOL;  Surgeon: Jonathon Bellows, MD;  Location: [facility] ENDOSCOPY;  Service: Gastroenterology;  Laterality: N/A;  . INSERTION OF DIALYSIS CATHETER Right   . PERIPHERAL VASCULAR CATHETERIZATION N/A 10/22/2014   Procedure: Dialysis/Perma Catheter Insertion;  Surgeon: Algernon Huxley, MD;  Location: [place] CV LAB;  Service: Cardiovascular;  Laterality: N/A;  . PERIPHERAL VASCULAR CATHETERIZATION N/A 02/28/2015   Procedure: Dialysis/Perma Catheter Removal;  Surgeon: Algernon Huxley, MD;  Location: [place] CV LAB;  Service: Cardiovascular;  Laterality: N/A;  . Repair fx left lower leg     yrs ago (age 89)   Prior to Admission medications   Medication Sig Start Date End Date Taking? Authorizing Provider  amLODipine (NORVASC) 5 MG tablet Take 5 mg by mouth daily.   Yes [provider]  clopidogrel (PLAVIX) 75 MG tablet Take 75 mg by mouth daily.   Yes [provider]  donepezil (ARICEPT) 5 MG tablet Take 1 tablet (5 mg total) by mouth at bedtime. 01/24/18 03/05/18 Yes Sowles, Drue Stager, MD  empagliflozin (JARDIANCE) 25 MG TABS tablet Take 25 mg by mouth daily.   Yes [provider]  glycopyrrolate (ROBINUL) 1 MG tablet Take 1 mg by mouth 2 (two) times daily.   Yes [provider]  insulin aspart (NOVOLOG FLEXPEN) 100  UNIT/ML FlexPen Inject 12 Units into the skin 2 (two) times daily.   Yes [provider]  insulin aspart (NOVOLOG) 100 UNIT/ML FlexPen Inject 18 Units into the skin daily. At 1700   Yes [provider]  Insulin Degludec-Liraglutide (XULTOPHY) 100-3.6 UNIT-MG/ML SOPN Inject 50 Units into the skin daily.   Yes [provider]  levETIRAcetam (KEPPRA) 500 MG tablet Take 500 mg by mouth 2  (two) times daily.   Yes [provider]  lipase/protease/amylase (CREON) 12000 units CPEP capsule Take 6,000 Units by mouth 3 (three) times daily before meals.   Yes [provider]  lipase/protease/amylase (CREON) 12000 units CPEP capsule Take 3,000 Units by mouth at bedtime. With snack   Yes [provider]  lisinopril (PRINIVIL,ZESTRIL) 5 MG tablet Take 5 mg by mouth daily.   Yes [provider]  metoprolol succinate (TOPROL-XL) 25 MG 24 hr tablet Take 1 tablet (25 mg total) by mouth daily. 01/24/18  Yes Sowles, Drue Stager, MD  rosuvastatin (CRESTOR) 40 MG tablet Take 1 tablet (40 mg total) by mouth daily. 01/24/18 03/05/18 Yes Steele Sizer, MD  aspirin EC 81 MG tablet Take 81 mg by mouth daily.    [provider]  famotidine (PEPCID) 20 MG tablet Take 1 tablet (20 mg total) by mouth 2 (two) times daily. 01/24/18 02/23/18  Steele Sizer, MD  gabapentin (NEURONTIN) 300 MG capsule Take 1 capsule (300 mg total) by mouth 2 (two) times daily. 01/24/18 02/23/18  Steele Sizer, MD  insulin glargine (LANTUS) 100 UNIT/ML injection Inject 0.1 mLs (10 Units total) into the skin daily. 01/24/18 02/23/18  Steele Sizer, MD  lacosamide 100 MG TABS Take 1 tablet (100 mg total) by mouth 2 (two) times daily. Patient not taking: Reported on 03/05/2018 06/11/17   Fritzi Mandes, MD  promethazine (PHENERGAN) 12.5 MG tablet Take 1 tablet (12.5 mg total) by mouth every 6 (six) hours as needed for nausea or vomiting. Patient not taking: Reported on 03/05/2018 03/30/17   Stark Klein, MD  sertraline (ZOLOFT) 25 MG tablet Take 1 tablet (25 mg total) by mouth daily. Patient not taking: Reported on 03/05/2018 01/24/18   Steele Sizer, MD   Allergies Allergies  Allergen Reactions  . Thorazine [Chlorpromazine] Other (See Comments)    Reaction:  Unknown , pt states it makes him feel real bad    Family History Family History  Problem Relation Age of Onset  . Diabetes Neg Hx     Social History  reports that he has been smoking cigarettes. He has been smoking about 0.50 packs per day. He has never used smokeless tobacco. He reports that he does not drink alcohol or use drugs.  Review Of Systems: Unable to obtain as patient is on continuous BiPAP  VITAL SIGNS: BP (!) 167/99   Pulse 97   Temp 98 F (36.7 C) (Oral)   Resp (!) 24   Ht 6\' 3"  (1.905 m)   Wt 79.4 kg   SpO2 96%   BMI 21.87 kg/m   HEMODYNAMICS:    VENTILATOR SETTINGS:    INTAKE / OUTPUT: No intake/output data recorded.  PHYSICAL EXAMINATION: General: Chronically ill looking HEENT: PERRLA, trachea midline, moderate JVD Neuro: Alert and oriented x2, moves all extremities, speech is normal Cardiovascular: Apical pulse regular, S1-S2, no murmur regurg or gallop, +2 pulses bilaterally, +2 edema bilaterally Lungs: Bilateral breath sounds with diffuse crackles in all lung fields, breath sounds diminished in the bases Abdomen: Nondistended, normal bowel sounds in all 4 quadrants  Musculoskeletal: No joint deformities, positive range of motion Skin: Skin is warm and dry with venous stasis discoloration in bilateral lower extremities  LABS:  BMET Recent Labs  Lab 09/12/18 0029  NA 139  K 5.5*  CL 102  CO2 24  BUN 64*  CREATININE 10.66*  GLUCOSE 121*    Electrolytes Recent Labs  Lab 09/12/18 0029  CALCIUM 9.1    CBC Recent Labs  Lab 09/11/18 2341  WBC 13.6*  HGB 10.6*  HCT 31.6*  PLT 191    Coag's No results for input(s): APTT, INR in the last 168 hours.  Sepsis Markers No results for input(s): LATICACIDVEN, PROCALCITON, O2SATVEN in the last 168 hours.  ABG No results for input(s): PHART, PCO2ART, PO2ART in the last 168 hours.  Liver Enzymes Recent Labs  Lab 09/12/18 0029  AST 30  ALT 24  ALKPHOS 135*  BILITOT 1.0  ALBUMIN 4.0    Cardiac Enzymes Recent Labs  Lab 09/12/18 0029  TROPONINI 0.05*    Glucose No results for input(s): GLUCAP in the last  168 hours.  Imaging Dg Chest Portable 1 View  Result Date: 09/11/2018 CLINICAL DATA:  Shortness of breath for 2 days EXAM: PORTABLE CHEST 1 VIEW COMPARISON:  05/06/2017 FINDINGS: Cardiac shadow is prominent. Increased vascular congestion is noted consistent with volume overload related to the missed dialysis therapy. Mild interstitial edema is noted. No focal infiltrate is seen. No effusion is noted. No bony abnormality is noted. IMPRESSION: Vascular congestion and interstitial edema consistent with volume overload. Electronically Signed   By: Inez Catalina M.D.   On: 09/11/2018 23:43    STUDIES:   2D echo on 05/02/2017 showed an EF of 60 to 65% with mild atrial regurgitation and trivial mitral regurgitation with left atrial dilatation and moderate dilatation of the right atrium  Repeat echo pending  CULTURES: None  ANTIBIOTICS: None  SIGNIFICANT EVENTS: 09/12/2018: Admitted  LINES/TUBES: Peripheral IVs Left arm HD shunt  DISCUSSION: 58 year old male presenting with acute pulmonary edema, acute hypoxic respiratory failure and hypertensive urgency  ASSESSMENT  Acute hypoxic respiratory failure secondary to volume overload Acute pulmonary edema Rule out new onset CHF Hyperkalemia SVT-resolved End-stage renal disease on hemodialysis Hypertensive urgency Tobacco use disorder Schizophrenia Type 2 diabetes  PLAN Continous BiPAP and titrate to nasal cannula as tolerated Nephrology consult for emergent hemodialysis-spoke with Dr. Juleen China Lasix 60 mg IV x1 Nebulized bronchodilators Nitroglycerin 2% topical 1 inch every 6 hours, hold for systolic blood pressure less than 140 Hydralazine as needed for systolic blood pressure greater than 160 Monitor and correct electrolytes Dextrose 50 g IV x1 with regular insulin 10 units IV x1 Cardiology consult pending 2D echo pending Blood glucose monitoring with sliding scale insulin coverage Resume all home medications  Best  Practice: Code Status: Full code Diet: N.p.o. and on continuous BiPAP GI prophylaxis: Protonix VTE prophylaxis: Subcu heparin  FAMILY  - Updates: No family at bedside.  Will update when available.  Hanako Tipping S. Tukov-Yual ANP-BC Pulmonary and [place] Pager [phone] or [phone]  NB: This document was prepared using Dragon voice recognition software and may include unintentional dictation errors.    09/12/2018, 3:34 AM

## 2018-09-12 NOTE — ED Notes (Signed)
EDP Malinda notified in person of Trop 0.05.

## 2018-09-12 NOTE — Progress Notes (Signed)
Pre HD Assessment    09/12/18 0930  Neurological  Level of Consciousness Alert  Orientation Level Oriented X4  Respiratory  Respiratory Pattern Regular  Chest Assessment Chest expansion symmetrical  Bilateral Breath Sounds Diminished;Expiratory wheezes  Cardiac  ECG Monitor Yes  Cardiac Rhythm ST;BBB  Vascular  R Radial Pulse +2  L Radial Pulse +2  Edema Right lower extremity  RLE Edema +2  LLE Edema +2  Integumentary  Integumentary (WDL) X  Skin Condition Dry  Musculoskeletal  Musculoskeletal (WDL) X  Generalized Weakness Yes  Gastrointestinal  Bowel Sounds Assessment Active  Last BM Date 09/12/18  GU Assessment  Genitourinary (WDL) X  Genitourinary Symptoms  (HD Pt)  Psychosocial  Psychosocial (WDL) WDL

## 2018-09-12 NOTE — ED Notes (Signed)
EDP verb order to hold hydralazine unless systolic BP 676 or greater. States to keep at bedside in case needed.

## 2018-09-12 NOTE — Progress Notes (Signed)
HD Tx End   09/12/18 1315  Vital Signs  Pulse Rate 97  Resp 14  BP (!) 153/103  Oxygen Therapy  SpO2 95 %  During Hemodialysis Assessment  Blood Flow Rate (mL/min) 200 mL/min  Arterial Pressure (mmHg) -100 mmHg  Venous Pressure (mmHg) 150 mmHg  Transmembrane Pressure (mmHg) 60 mmHg  Ultrafiltration Rate (mL/min) 875 mL/min  Dialysate Flow Rate (mL/min) 600 ml/min  Conductivity: Machine  14  HD Safety Checks Performed Yes  Dialysis Fluid Bolus Normal Saline  Bolus Amount (mL) 250 mL  Intra-Hemodialysis Comments Tx completed

## 2018-09-12 NOTE — Consult Note (Signed)
[facility] Consultation Note  Patient ID: Todd Brown, MRN: [id number], DOB/AGE: 58/19/1962 58 y.o. Admit date: 09/11/2018   Date of Consult: 09/12/2018 Primary Physician: System, Pcp Not In Primary Cardiologist: None  Chief Complaint:  Chief Complaint  Patient presents with  . Shortness of Breath   Reason for Consult: Heart failure  HPI: 58 y.o. male with known diabetes with complication including chronic kidney disease with dialysis history of deep venous thrombosis hyperlipidemia hypertension who has apparently missed a hemodialysis appointment.  The patient had significant pulmonary edema by chest x-ray with a BNP of 1176 and a troponin of 0.05.  EKG showed normal sinus rhythm with peaked T waves possibly consistent with hyperkalemia.  The patient has had diabetes and COPD which may have also been a factor at this time 2.  Currently the patient was given oxygenation and will need hemodialysis for further treatment of current significant pulmonary edema.  Hypertension was significant as well but on multiple medications.  The patient is hemodynamically stable this morning but has had some tachycardia possibly due to above.  Past Medical History:  Diagnosis Date  . Anemia   . Diabetes mellitus without complication ([place])   . Dialysis patient [facility])    Mon. -Wed.- Fri  . 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 ESRD (end stage renal disease) ([place])   . Hyperlipidemia   . Malignant hypertension   . Renal artery stenosis ([place])   . Schizophrenia [facility])       Surgical History:  Past Surgical History:  Procedure Laterality Date  . A/V FISTULAGRAM N/A 08/06/2016   Procedure: A/V Fistulagram;  Surgeon: Algernon Huxley, MD;  Location: [place] CV LAB;  Service: Cardiovascular;  Laterality: N/A;  . A/V SHUNT INTERVENTION N/A 08/06/2016   Procedure: A/V Shunt Intervention;  Surgeon: Algernon Huxley, MD;  Location: [place] [facility] LAB;   Service: Cardiovascular;  Laterality: N/A;  . AV FISTULA PLACEMENT Left 12/26/2014   Procedure: ARTERIOVENOUS (AV) FISTULA CREATION;  Surgeon: Algernon Huxley, MD;  Location: [facility] ORS;  Service: Vascular;  Laterality: Left;  . ESOPHAGOGASTRODUODENOSCOPY (EGD) WITH PROPOFOL N/A 05/06/2017   Procedure: ESOPHAGOGASTRODUODENOSCOPY (EGD) WITH PROPOFOL;  Surgeon: Jonathon Bellows, MD;  Location: [facility] ENDOSCOPY;  Service: Gastroenterology;  Laterality: N/A;  . INSERTION OF DIALYSIS CATHETER Right   . PERIPHERAL VASCULAR CATHETERIZATION N/A 10/22/2014   Procedure: Dialysis/Perma Catheter Insertion;  Surgeon: Algernon Huxley, MD;  Location: [place] CV LAB;  Service: Cardiovascular;  Laterality: N/A;  . PERIPHERAL VASCULAR CATHETERIZATION N/A 02/28/2015   Procedure: Dialysis/Perma Catheter Removal;  Surgeon: Algernon Huxley, MD;  Location: [place] CV LAB;  Service: Cardiovascular;  Laterality: N/A;  . Repair fx left lower leg     yrs ago (age 37)     Home Meds: Prior to Admission medications   Medication Sig Start Date End Date Taking? Authorizing Provider  acetaminophen (TYLENOL) 500 MG tablet Take 500 mg by mouth every 4 (four) hours as needed for mild pain or moderate pain.     [provider]  albuterol (PROAIR HFA) 108 (90 Base) MCG/ACT inhaler Inhale 1-2 puffs into the lungs every 4 (four) hours as needed for wheezing or shortness of breath.     [provider]  amLODipine (NORVASC) 10 MG tablet Take 0.5 tablets (5 mg total) by mouth daily. 05/07/17   Henreitta Leber, MD  calcium acetate (PHOSLO) 667 MG capsule Take 2 capsules (  1,334 mg total) by mouth 3 (three) times daily with meals. 05/19/15   Fritzi Mandes, MD  carbamazepine (TEGRETOL XR) 200 MG 12 hr tablet Take 1,000 mg by mouth every morning.    [provider]  cholecalciferol (VITAMIN D) 1000 UNITS tablet Take 1,000 Units by mouth daily.    [provider]  cloNIDine (CATAPRES) 0.1 MG tablet Take 1 tablet (0.1 mg  total) by mouth 2 (two) times daily. 05/07/17   Henreitta Leber, MD  cyclobenzaprine (FLEXERIL) 5 MG tablet Take 5 mg by mouth 2 (two) times daily as needed for muscle spasms.    [provider]  ferrous sulfate 325 (65 FE) MG tablet Take 1 tablet (325 mg total) by mouth daily. 05/19/15   Fritzi Mandes, MD  fluPHENAZine (PROLIXIN) 2.5 MG tablet Take 2.5 mg by mouth 2 (two) times daily.     [provider]  Fluticasone-Salmeterol (ADVAIR) 500-50 MCG/DOSE AEPB Inhale 1 puff into the lungs 2 (two) times daily.    [provider]  hydrALAZINE (APRESOLINE) 100 MG tablet Take 0.5 tablets (50 mg total) by mouth every 8 (eight) hours. 05/07/17   Henreitta Leber, MD  multivitamin (RENA-VIT) TABS tablet Take 1 tablet by mouth daily.    [provider]  mupirocin ointment (BACTROBAN) 2 % Place 1 application into the nose 2 (two) times daily. apply for 5 days 05/13/17   [name], MD  omeprazole (PRILOSEC) 40 MG capsule Take 1 capsule (40 mg total) by mouth daily. 05/06/17   Henreitta Leber, MD  traMADol (ULTRAM) 50 MG tablet Take 50 mg by mouth 2 (two) times daily.    [provider]  trihexyphenidyl (ARTANE) 2 MG tablet Take 2 mg by mouth 2 (two) times daily.    [provider]  vitamin C (VITAMIN C) 500 MG tablet 1 tab PO BID X 9 days and then start 1 tab daily. 05/06/17   Henreitta Leber, MD    Inpatient Medications:  . amLODipine  5 mg Oral Daily  . aspirin EC  81 mg Oral Daily  . calcium acetate  1,334 mg Oral TID WC  . carbamazepine  1,000 mg Oral [id number]  . cholecalciferol  1,000 Units Oral Daily  . cloNIDine  0.1 mg Oral BID  . ferrous sulfate  325 mg Oral Daily  . fluPHENAZine  2.5 mg Oral BID  . heparin  5,000 Units Subcutaneous Q8H  . hydrALAZINE  50 mg Oral Q8H  . insulin aspart  0-5 Units Subcutaneous QHS  . insulin aspart  0-9 Units Subcutaneous TID WC  . multivitamin  1 tablet Oral Daily  . nitroGLYCERIN  1 inch Topical Q8H  .  pantoprazole  40 mg Oral Daily  . sodium chloride flush  3 mL Intravenous Q12H  . trihexyphenidyl  2 mg Oral BID  . ascorbic acid  500 mg Oral Daily   . sodium chloride      Allergies:  Allergies  Allergen Reactions  . Thorazine [Chlorpromazine] Other (See Comments)    Reaction:  Unknown , pt states it makes him feel real bad    Social History   Socioeconomic History  . Marital status: Single    Spouse name: Not on file  . Number of children: Not on file  . Years of education: Not on file  . Highest education level: Not on file  Occupational History  . Not on file  Social Needs  . Financial resource strain: Not on file  .  Food insecurity:    Worry: Not on file    Inability: Not on file  . Transportation needs:    Medical: Not on file    Non-medical: Not on file  Tobacco Use  . Smoking status: Current Every Day Smoker    Packs/day: 0.50    Types: Cigarettes  . Smokeless tobacco: Never Used  . Tobacco comment: 10 cigars a day  Substance and Sexual Activity  . Alcohol use: No  . Drug use: No  . Sexual activity: Not on file  Lifestyle  . Physical activity:    Days per week: Not on file    Minutes per session: Not on file  . Stress: Not on file  Relationships  . Social connections:    Talks on phone: Not on file    Gets together: Not on file    Attends religious service: Not on file    Active member of club or organization: Not on file    Attends meetings of clubs or organizations: Not on file    Relationship status: Not on file  . Intimate partner violence:    Fear of current or ex partner: Not on file    Emotionally abused: Not on file    Physically abused: Not on file    Forced sexual activity: Not on file  Other Topics Concern  . Not on file  Social History Narrative  . Not on file     Family History  Problem Relation Age of Onset  . Diabetes Neg Hx      Review of Systems Positive for this of breath Negative for: General:  chills, fever, night  sweats or weight changes.  Cardiovascular: PND orthopnea syncope dizziness  Dermatological skin lesions rashes Respiratory: Cough congestion Urologic: Frequent urination urination at night and hematuria Abdominal: negative for nausea, vomiting, diarrhea, bright red blood per rectum, melena, or hematemesis Neurologic: negative for visual changes, and/or hearing changes  All other systems reviewed and are otherwise negative except as noted above.  Labs: Recent Labs    09/12/18 0029 09/12/18 0501  CKTOTAL  --  218  TROPONINI 0.05*  --    Lab Results  Component Value Date   WBC 13.6 (H) 09/11/2018   HGB 10.6 (L) 09/11/2018   HCT 31.6 (L) 09/11/2018   MCV 102.3 (H) 09/11/2018   PLT 191 09/11/2018    Recent Labs  Lab 09/12/18 0029 09/12/18 0501  NA 139 137  K 5.5* 6.3*  CL 102 101  CO2 24 23  BUN 64* 68*  CREATININE 10.66* 10.93*  CALCIUM 9.1 9.3  PROT 7.7  --   BILITOT 1.0  --   ALKPHOS 135*  --   ALT 24  --   AST 30  --   GLUCOSE 121* 123*   Lab Results  Component Value Date   TRIG 106 08/03/2016   No results found for: DDIMER  Radiology/Studies:  Dg Chest Portable 1 View  Result Date: 09/11/2018 CLINICAL DATA:  Shortness of breath for 2 days EXAM: PORTABLE CHEST 1 VIEW COMPARISON:  05/06/2017 FINDINGS: Cardiac shadow is prominent. Increased vascular congestion is noted consistent with volume overload related to the missed dialysis therapy. Mild interstitial edema is noted. No focal infiltrate is seen. No effusion is noted. No bony abnormality is noted. IMPRESSION: Vascular congestion and interstitial edema consistent with volume overload. Electronically Signed   By: Inez Catalina M.D.   On: 09/11/2018 23:43    EKG: Normal sinus rhythm with peaked T waves  Weights: Filed Weights   09/11/18 2321 09/12/18 0354  Weight: 79.4 kg 79.1 kg     Physical Exam: Blood pressure (!) 146/87, pulse 88, temperature (!) 97.5 F (36.4 C), temperature source Axillary, resp.  rate (!) 21, height 6\' 3"  (1.905 m), weight 79.1 kg, SpO2 94 %. Body mass index is 21.8 kg/m. General: Well developed, well nourished, in no acute distress. Head eyes ears nose throat: Normocephalic, atraumatic, sclera non-icteric, no xanthomas, nares are without discharge. No apparent thyromegaly and/or mass  Lungs: Normal respiratory effort.  no wheezes, basilar rales, few to rhonchi.  Heart: RRR with normal S1 S2. no murmur gallop, no rub, PMI is normal size and placement, carotid upstroke normal without bruit, jugular venous pressure is normal Abdomen: Soft, non-tender, non-distended with normoactive bowel sounds. No hepatomegaly. No rebound/guarding. No obvious abdominal masses. Abdominal aorta is normal size without bruit Extremities: 1+ edema. no cyanosis, no clubbing, no ulcers  Peripheral : 2+ bilateral upper extremity pulses, 2+ bilateral femoral pulses, 2+ bilateral dorsal pedal pulse Neuro: Alert and oriented. No facial asymmetry. No focal deficit. Moves all extremities spontaneously. Musculoskeletal: Normal muscle tone without kyphosis Psych:  Responds to questions appropriately with a normal affect.    Assessment: 58 year old male with diabetes hypertension hyperlipidemia and acute diastolic dysfunction congestive heart failure with pulmonary, chest and likely secondary to missing dialysis now hemodynamically stable without evidence of myocardial infarction  Plan: 1.  Continue hypertension control with current medical regimen 2.  Dialysis without restriction for acute diastolic dysfunction congestive heart failure with volume overload 3.  Further consideration of echocardiogram for LV systolic dysfunction valvular heart disease contributing to above 4.  Begin ambulation after above  Signed, Corey Skains M.D. [facility] 09/12/2018, 9:08 AM

## 2018-09-12 NOTE — ED Notes (Signed)
EKG completed per verb order.

## 2018-09-12 NOTE — Progress Notes (Signed)
*  PRELIMINARY RESULTS* Echocardiogram 2D Echocardiogram has been performed.  Todd Brown 09/12/2018, 2:06 PM

## 2018-09-12 NOTE — Progress Notes (Signed)
[facility]  ROUNDING NOTE   Subjective:  Patient well-known to Todd Brown. He came in with significant acute shortness of breath. States that he drank a significant amount of fluid over the weekend. Blood pressure also noted to be high. Currently on bipap.  Objective:  Vital signs in last 24 hours:  Temp:  [97.4 F (36.3 C)-98 F (36.7 C)] 97.4 F (36.3 C) (04/13 0945) Pulse Rate:  [66-129] 98 (04/13 1130) Resp:  [11-33] 21 (04/13 1130) BP: (146-220)/(87-131) 168/111 (04/13 1130) SpO2:  [91 %-100 %] 95 % (04/13 1130) FiO2 (%):  [40 %] 40 % (04/13 0945) Weight:  [79.1 kg-79.4 kg] 79.1 kg (04/13 0930)  Weight change:  Filed Weights   09/11/18 2321 09/12/18 0354 09/12/18 0930  Weight: 79.4 kg 79.1 kg 79.1 kg    Intake/Output: No intake/output data recorded.   Intake/Output this shift:  No intake/output data recorded.  Physical Exam: General: Mild respiratory distress  Head: Normocephalic, atraumatic. Moist oral mucosal membranes  Eyes: Anicteric  Neck: Supple, trachea midline  Lungs:  Bibasilar rales  Heart: S1S2 no rubs  Abdomen:  Soft, nontender, bowel sounds present  Extremities: 2+ peripheral edema.  Neurologic: Awake, alert, following commands  Skin: No lesions  Access: LUE AVF    Basic Metabolic Panel: Recent Labs  Lab 09/12/18 0029 09/12/18 0501  NA 139 137  K 5.5* 6.3*  CL 102 101  CO2 24 23  GLUCOSE 121* 123*  BUN 64* 68*  CREATININE 10.66* 10.93*  CALCIUM 9.1 9.3  MG  --  2.4  PHOS  --  4.4    Liver Function Tests: Recent Labs  Lab 09/12/18 0029  AST 30  ALT 24  ALKPHOS 135*  BILITOT 1.0  PROT 7.7  ALBUMIN 4.0   No results for input(s): LIPASE, AMYLASE in the last 168 hours. No results for input(s): AMMONIA in the last 168 hours.  CBC: Recent Labs  Lab 09/11/18 2341  WBC 13.6*  NEUTROABS 11.9*  HGB 10.6*  HCT 31.6*  MCV 102.3*  PLT 191    Cardiac Enzymes: Recent Labs  Lab 09/12/18 0029 09/12/18 0501  09/12/18 1055  CKTOTAL  --  218 205  TROPONINI 0.05*  --   --     BNP: Invalid input(s): POCBNP  CBG: Recent Labs  Lab 09/12/18 0259 09/12/18 0746  GLUCAP 106* 108*    Microbiology: Results for orders placed or performed during the hospital encounter of 04/30/17  MRSA PCR Screening     Status: Abnormal   Collection Time: 04/30/17 11:14 PM  Result Value Ref Range Status   MRSA by PCR POSITIVE (A) NEGATIVE Final    Comment:        The GeneXpert MRSA Assay (FDA approved for NASAL specimens only), is one component of a comprehensive MRSA colonization surveillance program. It is not intended to diagnose MRSA infection nor to guide or monitor treatment for MRSA infections. RESULT CALLED TO, READ BACK BY AND VERIFIED WITH: tamara conyers at 0118 on 05/01/17 rww     Coagulation Studies: No results for input(s): LABPROT, INR in the last 72 hours.  Urinalysis: No results for input(s): COLORURINE, LABSPEC, PHURINE, GLUCOSEU, HGBUR, BILIRUBINUR, KETONESUR, PROTEINUR, UROBILINOGEN, NITRITE, LEUKOCYTESUR in the last 72 hours.  Invalid input(s): APPERANCEUR    Imaging: Dg Chest Portable 1 View  Result Date: 09/11/2018 CLINICAL DATA:  Shortness of breath for 2 days EXAM: PORTABLE CHEST 1 VIEW COMPARISON:  05/06/2017 FINDINGS: Cardiac shadow is prominent. Increased vascular congestion is noted  consistent with volume overload related to the missed dialysis therapy. Mild interstitial edema is noted. No focal infiltrate is seen. No effusion is noted. No bony abnormality is noted. IMPRESSION: Vascular congestion and interstitial edema consistent with volume overload. Electronically Signed   By: Inez Catalina M.D.   On: 09/11/2018 23:43     Medications:   . sodium chloride     . amLODipine  5 mg Oral Daily  . aspirin EC  81 mg Oral Daily  . calcium acetate  1,334 mg Oral TID WC  . carbamazepine  1,000 mg Oral BH-q7a  . cholecalciferol  1,000 Units Oral Daily  . cloNIDine  0.1  mg Oral BID  . ferrous sulfate  325 mg Oral Daily  . fluPHENAZine  2.5 mg Oral BID  . heparin  5,000 Units Subcutaneous Q8H  . hydrALAZINE  50 mg Oral Q8H  . insulin aspart  0-5 Units Subcutaneous QHS  . insulin aspart  0-9 Units Subcutaneous TID WC  . multivitamin  1 tablet Oral Daily  . nitroGLYCERIN  1 inch Topical Q8H  . pantoprazole  40 mg Oral Daily  . sodium chloride flush  3 mL Intravenous Q12H  . trihexyphenidyl  2 mg Oral BID  . ascorbic acid  500 mg Oral Daily   sodium chloride, acetaminophen, albuterol, cyclobenzaprine, fentaNYL (SUBLIMAZE) injection, hydrALAZINE, labetalol, ondansetron (ZOFRAN) IV, sodium chloride flush, traMADol, zolpidem  Assessment/ Plan:  58 y.o. male with end stage renal disease on hemodialysis, schizophrenia, hypertension, chronic hepatitis C, history of DVT.  Admitted with acute respiratory failure secondary to pulmonary edema.  [place] [facility].   1.  ESRD on HD MWF.  Patient due for hemodialysis today.  Orders have been prepared.  Ultrafiltration plan to treat pulmonary edema.  Reevaluate pulmonary status after dialysis.  2.  Acute respiratory failure due to acute pulmonary edema.  Due to high fluid gain over the weekend.  Patient states that he drank a significant amount of fluid.  We are planning for dialysis and ultrafiltration as above.  3.  Anemia of chronic kidney disease.  Hemoglobin currently 10.6.  Hold off on Epogen for now as blood pressure is a bit elevated.  4.  Hyperkalemia.  Serum potassium 6.3.  This should come down with 2K bath.  5.  Hypertension.  Agree with amlodipine, clonidine, hydralazine.   LOS: 0 Quantavis Obryant 4/13/202011:47 AM

## 2018-09-12 NOTE — Progress Notes (Signed)
[facility] [place] NAME: Todd Brown    MR#:  [id number]  DATE OF BIRTH:  [birth year]  SUBJECTIVE:  CHIEF COMPLAINT: Patient is off BiPAP, shortness of breath better  REVIEW OF SYSTEMS:  CONSTITUTIONAL: No fever, fatigue or weakness.  EYES: No blurred or double vision.  EARS, NOSE, AND THROAT: No tinnitus or ear pain.  RESPIRATORY: No cough, shortness of breath, wheezing or hemoptysis.  CARDIOVASCULAR: No chest pain, orthopnea, edema.  GASTROINTESTINAL: No nausea, vomiting, diarrhea or abdominal pain.  GENITOURINARY: No dysuria, hematuria.  ENDOCRINE: No polyuria, nocturia,  HEMATOLOGY: No anemia, easy bruising or bleeding SKIN: No rash or lesion. MUSCULOSKELETAL: No joint pain or arthritis.   NEUROLOGIC: No tingling, numbness, weakness.  PSYCHIATRY: No anxiety or depression.   DRUG ALLERGIES:   Allergies  Allergen Reactions  . Thorazine [Chlorpromazine] Other (See Comments)    Reaction:  Unknown , pt states it makes him feel real bad    VITALS:  Blood pressure (!) 150/92, pulse 94, temperature 98 F (36.7 C), temperature source Axillary, resp. rate 15, height 6\' 3"  (1.905 m), weight 75.4 kg, SpO2 95 %.  PHYSICAL EXAMINATION:  GENERAL:  58 y.o.-year-old patient lying in the bed with no acute distress.  EYES: Pupils equal, round, reactive to light and accommodation. No scleral icterus. Extraocular muscles intact.  HEENT: Head atraumatic, normocephalic. Oropharynx and nasopharynx clear.  NECK:  Supple, no jugular venous distention. No thyroid enlargement, no tenderness.  LUNGS: Normal breath sounds bilaterally, no wheezing, rales,rhonchi or crepitation. No use of accessory muscles of respiration.  CARDIOVASCULAR: S1, S2 normal. No murmurs, rubs, or gallops.  ABDOMEN: Soft, nontender, nondistended. Bowel sounds present. No organomegaly or mass.  EXTREMITIES: No pedal edema, cyanosis, or clubbing.  NEUROLOGIC: Cranial  nerves II through XII are intact. Muscle strength 5/5 in all extremities. Sensation intact. Gait not checked.  PSYCHIATRIC: The patient is alert and oriented x 3.  SKIN: No obvious rash, lesion, or ulcer.    LABORATORY PANEL:   CBC Recent Labs  Lab 09/11/18 2341  WBC 13.6*  HGB 10.6*  HCT 31.6*  PLT 191   ------------------------------------------------------------------------------------------------------------------  Chemistries  Recent Labs  Lab 09/12/18 0029 09/12/18 0501  NA 139 137  K 5.5* 6.3*  CL 102 101  CO2 24 23  GLUCOSE 121* 123*  BUN 64* 68*  CREATININE 10.66* 10.93*  CALCIUM 9.1 9.3  MG  --  2.4  AST 30  --   ALT 24  --   ALKPHOS 135*  --   BILITOT 1.0  --    ------------------------------------------------------------------------------------------------------------------  Cardiac Enzymes Recent Labs  Lab 09/12/18 1055  TROPONINI 0.32*   ------------------------------------------------------------------------------------------------------------------  RADIOLOGY:  Dg Chest Portable 1 View  Result Date: 09/11/2018 CLINICAL DATA:  Shortness of breath for 2 days EXAM: PORTABLE CHEST 1 VIEW COMPARISON:  05/06/2017 FINDINGS: Cardiac shadow is prominent. Increased vascular congestion is noted consistent with volume overload related to the missed dialysis therapy. Mild interstitial edema is noted. No focal infiltrate is seen. No effusion is noted. No bony abnormality is noted. IMPRESSION: Vascular congestion and interstitial edema consistent with volume overload. Electronically Signed   By: Inez Catalina M.D.   On: 09/11/2018 23:43    EKG:   Orders placed or performed during the hospital encounter of 09/11/18  . EKG 12-Lead  . EKG 12-Lead  . EKG 12-Lead  . EKG 12-Lead  . EKG 12-Lead  . EKG 12-Lead  . EKG 12-Lead  .  EKG 12-Lead    ASSESSMENT AND PLAN:    #1.  Acute hypoxic respiratory failure secondary to acute pulmonary edema likely due to  luid overload associated with end-stage renal disease  Of BiPAP  hemodialysis today   2D echo and a cardiology consultation by Dr. Nehemiah Massed who was contacted about the patient.   2.  Supraventricular tachycardia.    Resolved after 1 dose of IV Cardizem Cycle troponins 0.32 patient is asymptomatic Echocardiogram Cardiology consult.   3.  Hypertensive urgency.   Increase clonidine 0.2 mg Continue hydralazine and amlodipine and titrate as needed    4.  End-stage renal disease on hemodialysis on Monday Wednesday Friday Hemodialysis done today  5.  Type 2 diabetes mellitus.  He will be placed on supplemental coverage with NovoLog.  6.    Hyperkalemia patient had a Kayexalate and hemodialysis Will check repeat numbers   7.  Schizophrenia.  His psychotropic medications will be continued.  8.  DVT prophylaxis.  This will be covered with subcutaneous heparin     All the records are reviewed and case discussed with Care Management/Social Workerr. Management plans discussed with the patient, family and they are in agreement.  CODE STATUS:   TOTAL TIME TAKING CARE OF THIS PATIENT: 36  minutes.   POSSIBLE D/C IN 1-2  DAYS, DEPENDING ON CLINICAL CONDITION.  Note: This dictation was prepared with Dragon dictation along with smaller phrase technology. Any transcriptional errors that result from this process are unintentional.   [name] M.D on 09/12/2018 at 4:19 PM  Between 7am to 6pm - Pager - [phone] After 6pm go to [email or web address] - password [email or web address] [facility]  Office  [phone]  CC: Primary care physician; System, Pcp Not In

## 2018-09-12 NOTE — ED Notes (Signed)
ED TO INPATIENT HANDOFF REPORT  ED Nurse Name and Phone #: Metta Clines [phone]  S Name/Age/Gender Todd Brown 58 y.o. male Room/Bed: ED06A/ED06A  Code Status   Code Status: Prior  Home/SNF/Other Home Patient oriented to: self, place, time and situation Is this baseline? Yes   Triage Complete: Triage complete  Chief Complaint shob  Triage Note Pt in via EMS from home d/t fluid overload; dialysis pt; last HD Friday; SOB for last few days; 2L O2 at home but on non-rebeather with EMS. VSS.    Allergies Allergies  Allergen Reactions  . Thorazine [Chlorpromazine] Other (See Comments)    Reaction:  Unknown , pt states it makes him feel real bad    Level of Care/Admitting Diagnosis ED Disposition    None      B Medical/Surgery History Past Medical History:  Diagnosis Date  . Anemia   . Diabetes mellitus without complication ([place])   . Dialysis patient [facility])    Mon. -Wed.- Fri  . DVT (deep venous thrombosis) ([place])    cephalic and basolic vein thrombosis  . ESRD (end stage renal disease) ([place])   . Hyperlipidemia   . Malignant hypertension   . Renal artery stenosis ([place])   . Schizophrenia [facility])    Past Surgical History:  Procedure Laterality Date  . A/V FISTULAGRAM N/A 08/06/2016   Procedure: A/V Fistulagram;  Surgeon: Algernon Huxley, MD;  Location: [place] CV LAB;  Service: Cardiovascular;  Laterality: N/A;  . A/V SHUNT INTERVENTION N/A 08/06/2016   Procedure: A/V Shunt Intervention;  Surgeon: Algernon Huxley, MD;  Location: [place] CV LAB;  Service: Cardiovascular;  Laterality: N/A;  . AV FISTULA PLACEMENT Left 12/26/2014   Procedure: ARTERIOVENOUS (AV) FISTULA CREATION;  Surgeon: Algernon Huxley, MD;  Location: [facility] ORS;  Service: Vascular;  Laterality: Left;  . ESOPHAGOGASTRODUODENOSCOPY (EGD) WITH PROPOFOL N/A 05/06/2017   Procedure: ESOPHAGOGASTRODUODENOSCOPY (EGD) WITH PROPOFOL;  Surgeon: Jonathon Bellows, MD;  Location: [facility] ENDOSCOPY;  Service: Gastroenterology;   Laterality: N/A;  . INSERTION OF DIALYSIS CATHETER Right   . PERIPHERAL VASCULAR CATHETERIZATION N/A 10/22/2014   Procedure: Dialysis/Perma Catheter Insertion;  Surgeon: Algernon Huxley, MD;  Location: [place] CV LAB;  Service: Cardiovascular;  Laterality: N/A;  . PERIPHERAL VASCULAR CATHETERIZATION N/A 02/28/2015   Procedure: Dialysis/Perma Catheter Removal;  Surgeon: Algernon Huxley, MD;  Location: [place] CV LAB;  Service: Cardiovascular;  Laterality: N/A;  . Repair fx left lower leg     yrs ago (age 52)     A IV Location/Drains/Wounds Patient Lines/Drains/Airways Status   Active Line/Drains/Airways    Name:   Placement date:   Placement time:   Site:   Days:   Peripheral IV 09/11/18 Left External jugular   09/11/18    2332    External jugular   1   Fistula / Graft Left Upper arm Arteriovenous fistula   -    -    Upper arm             Intake/Output Last 24 hours No intake or output data in the 24 hours ending 09/12/18 0034  Labs/Imaging Results for orders placed or performed during the hospital encounter of 09/11/18 (from the past 48 hour(s))  Brain natriuretic peptide     Status: Abnormal   Collection Time: 09/11/18 11:41 PM  Result Value Ref Range   B Natriuretic Peptide 1,176.0 (H) 0.0 - 100.0 pg/mL    Comment: Performed at [facility], [address]  [place], [place] [address]  CBC with Differential     Status: Abnormal   Collection Time: 09/11/18 11:41 PM  Result Value Ref Range   WBC 13.6 (H) 4.0 - 10.5 K/uL   RBC 3.09 (L) 4.22 - 5.81 MIL/uL   Hemoglobin 10.6 (L) 13.0 - 17.0 g/dL   HCT 31.6 (L) 39.0 - 52.0 %   MCV 102.3 (H) 80.0 - 100.0 fL   MCH 34.3 (H) 26.0 - 34.0 pg   MCHC 33.5 30.0 - 36.0 g/dL   RDW 13.3 11.5 - 15.5 %   Platelets 191 150 - 400 K/uL   nRBC 0.0 0.0 - 0.2 %   Neutrophils Relative % 88 %   Neutro Abs 11.9 (H) 1.7 - 7.7 K/uL   Lymphocytes Relative 7 %   Lymphs Abs 1.0 0.7 - 4.0 K/uL   Monocytes Relative 4 %   Monocytes Absolute  0.6 0.1 - 1.0 K/uL   Eosinophils Relative 1 %   Eosinophils Absolute 0.1 0.0 - 0.5 K/uL   Basophils Relative 0 %   Basophils Absolute 0.0 0.0 - 0.1 K/uL   Immature Granulocytes 0 %   Abs Immature Granulocytes 0.05 0.00 - 0.07 K/uL    Comment: Performed at [facility], [address] [place], [place] [address]   Dg Chest Portable 1 View  Result Date: 09/11/2018 CLINICAL DATA:  Shortness of breath for 2 days EXAM: PORTABLE CHEST 1 VIEW COMPARISON:  05/06/2017 FINDINGS: Cardiac shadow is prominent. Increased vascular congestion is noted consistent with volume overload related to the missed dialysis therapy. Mild interstitial edema is noted. No focal infiltrate is seen. No effusion is noted. No bony abnormality is noted. IMPRESSION: Vascular congestion and interstitial edema consistent with volume overload. Electronically Signed   By: Inez Catalina M.D.   On: 09/11/2018 23:43    Pending Labs Unresulted Labs (From admission, onward)    Start     Ordered   09/12/18 0015  Comprehensive metabolic panel  Once,   STAT     09/12/18 0015   09/12/18 0015  Troponin I -  Once,   STAT     09/12/18 0015          Vitals/Pain Today's Vitals   09/12/18 0020 09/12/18 0020 09/12/18 0021 09/12/18 0030  BP: (!) 190/119   (!) 180/115  Pulse:   (!) 108 (!) 105  Resp: (!) 25     Temp:  98 F (36.7 C)    TempSrc:  Oral    SpO2:   97% 99%  Weight:      Height:      PainSc:        Isolation Precautions No active isolations  Medications Medications  metoprolol tartrate (LOPRESSOR) injection 5 mg (5 mg Intravenous Not Given 09/11/18 2351)  nitroGLYCERIN (NITROGLYN) 2 % ointment 1 inch (1 inch Topical Not Given 09/11/18 2350)  hydrALAZINE (APRESOLINE) injection 5 mg (has no administration in time range)  enalaprilat (VASOTEC) injection 1.25 mg (1.25 mg Intravenous Given 09/11/18 2340)  nitroGLYCERIN (NITROGLYN) 2 % ointment 1 inch (1 inch Topical Given by Other 09/11/18 2323)  furosemide  (LASIX) injection 20 mg (20 mg Intravenous Given 09/11/18 2341)  metoprolol tartrate (LOPRESSOR) injection 5 mg (5 mg Intravenous Given 09/11/18 2350)  nitroGLYCERIN (NITROGLYN) 2 % ointment 1 inch (1 inch Topical Given 09/11/18 2348)    Mobility walks with device Moderate fall risk   Focused Assessments Read Below:  Renal: HD pt who hasn't had dialysis since Friday; fluid  overloaded; BP initially over 606 [id number] diastolic; currently on Bipap at 40% O2; Resp: tachypnic/dyspnea; wheezing LLL; Cardiac: ST/NSR.   R Recommendations: See Admitting Provider Note  Report given to:   Additional Notes:  Thrill/bruit at L arm HD access. L EJ 20g IV.

## 2018-09-12 NOTE — ED Notes (Signed)
Pt updated. Pt states he will use urinal as needed. States does not usually urinate much anymore since dialysis. States is continent and will notify this RN when he urinates so I&O can be monitored. Understands may need bladder scan but refuses I&O cath.

## 2018-09-12 NOTE — ED Notes (Signed)
3rd grn top sent to lab.

## 2018-09-12 NOTE — ED Notes (Signed)
Pt has bruit/thrill to HD access at L arm.

## 2018-09-12 NOTE — ED Notes (Signed)
Pt given warm blankets. Offered to turn on tv but pt states okay without tv on. Denies need to dim lights. Rails up. Bed in lowest position.

## 2018-09-13 LAB — HIV ANTIBODY (ROUTINE TESTING W REFLEX): HIV Screen 4th Generation wRfx: NONREACTIVE

## 2018-09-13 LAB — CBC WITH DIFFERENTIAL/PLATELET
Abs Immature Granulocytes: 0.03 10*3/uL (ref 0.00–0.07)
Basophils Absolute: 0 10*3/uL (ref 0.0–0.1)
Basophils Relative: 0 %
Eosinophils Absolute: 0.1 10*3/uL (ref 0.0–0.5)
Eosinophils Relative: 1 %
HCT: 28.2 % — ABNORMAL LOW (ref 39.0–52.0)
Hemoglobin: 9.7 g/dL — ABNORMAL LOW (ref 13.0–17.0)
Immature Granulocytes: 0 %
Lymphocytes Relative: 12 %
Lymphs Abs: 0.9 10*3/uL (ref 0.7–4.0)
MCH: 34 pg (ref 26.0–34.0)
MCHC: 34.4 g/dL (ref 30.0–36.0)
MCV: 98.9 fL (ref 80.0–100.0)
Monocytes Absolute: 0.7 10*3/uL (ref 0.1–1.0)
Monocytes Relative: 10 %
Neutro Abs: 5.5 10*3/uL (ref 1.7–7.7)
Neutrophils Relative %: 77 %
Platelets: 142 10*3/uL — ABNORMAL LOW (ref 150–400)
RBC: 2.85 MIL/uL — ABNORMAL LOW (ref 4.22–5.81)
RDW: 13.2 % (ref 11.5–15.5)
WBC: 7.1 10*3/uL (ref 4.0–10.5)
nRBC: 0 % (ref 0.0–0.2)

## 2018-09-13 LAB — GLUCOSE, CAPILLARY
Glucose-Capillary: 96 mg/dL (ref 70–99)
Glucose-Capillary: 98 mg/dL (ref 70–99)

## 2018-09-13 LAB — HEMOGLOBIN A1C
Hgb A1c MFr Bld: 5 % (ref 4.8–5.6)
Mean Plasma Glucose: 97 mg/dL

## 2018-09-13 LAB — BASIC METABOLIC PANEL
Anion gap: 16 — ABNORMAL HIGH (ref 5–15)
BUN: 52 mg/dL — ABNORMAL HIGH (ref 6–20)
CO2: 25 mmol/L (ref 22–32)
Calcium: 9.4 mg/dL (ref 8.9–10.3)
Chloride: 97 mmol/L — ABNORMAL LOW (ref 98–111)
Creatinine, Ser: 9.51 mg/dL — ABNORMAL HIGH (ref 0.61–1.24)
GFR calc Af Amer: 6 mL/min — ABNORMAL LOW (ref >60)
GFR calc non Af Amer: 5 mL/min — ABNORMAL LOW (ref >60)
Glucose, Bld: 103 mg/dL — ABNORMAL HIGH (ref 70–99)
Potassium: 4.5 mmol/L (ref 3.5–5.1)
Sodium: 138 mmol/L (ref 135–145)

## 2018-09-13 LAB — PHOSPHORUS: Phosphorus: 3.8 mg/dL (ref 2.5–4.6)

## 2018-09-13 MED ORDER — CARVEDILOL 6.25 MG PO TABS: 6.2500 mg | ORAL_TABLET | Freq: Two times a day (BID) | ORAL | Status: DC

## 2018-09-13 MED ORDER — HYDRALAZINE HCL 100 MG PO TABS: 100.0000 mg | ORAL_TABLET | Freq: Two times a day (BID) | ORAL | Status: DC

## 2018-09-13 MED ORDER — AMLODIPINE BESYLATE 10 MG PO TABS: 10.0000 mg | ORAL_TABLET | Freq: Every day | ORAL | Status: DC

## 2018-09-13 MED ORDER — CLONIDINE HCL 0.1 MG PO TABS
0.1000 mg | ORAL_TABLET | Freq: Three times a day (TID) | ORAL | Status: DC
  Administered 2018-09-13: 0.1 mg via ORAL
  Filled 2018-09-13: qty 1

## 2018-09-13 MED ORDER — HYDRALAZINE HCL 50 MG PO TABS: 75.0000 mg | ORAL_TABLET | Freq: Three times a day (TID) | ORAL | Status: DC

## 2018-09-13 NOTE — Progress Notes (Signed)
[facility]  ROUNDING NOTE   Subjective:  Patient underwent hemodialysis yesterday. We will plan for additional dialysis treatment for additional ultrafiltration. Patient feeling much better as compared to admission.  Objective:  Vital signs in last 24 hours:  Temp:  [98 F (36.7 C)-99 F (37.2 C)] 98.7 F (37.1 C) (04/14 1157) Pulse Rate:  [85-121] 92 (04/14 1157) Resp:  [11-25] 16 (04/14 1157) BP: (135-176)/(73-106) 176/106 (04/14 1157) SpO2:  [88 %-98 %] 96 % (04/14 1157) Weight:  [75.4 kg-76.2 kg] 75.9 kg (04/14 1157)  Weight change: -0.28 kg Filed Weights   09/12/18 1317 09/13/18 0500 09/13/18 1157  Weight: 75.4 kg 76.2 kg 75.9 kg    Intake/Output: I/O last 3 completed shifts: In: 150 [P.O.:150] Out: 4100 [Other:4100]   Intake/Output this shift:  No intake/output data recorded.  Physical Exam: General: No acute distress  Head: Normocephalic, atraumatic. Moist oral mucosal membranes  Eyes: Anicteric  Neck: Supple, trachea midline  Lungs:  Bibasilar rales  Heart: S1S2 no rubs  Abdomen:  Soft, nontender, bowel sounds present  Extremities: 2+ peripheral edema.  Neurologic: Awake, alert, following commands  Skin: No lesions  Access: LUE AVF    Basic Metabolic Panel: Recent Labs  Lab 09/12/18 0029 09/12/18 0501 09/12/18 1649 09/13/18 0420  NA 139 137  --  138  K 5.5* 6.3* 4.2 4.5  CL 102 101  --  97*  CO2 24 23  --  25  GLUCOSE 121* 123*  --  103*  BUN 64* 68*  --  52*  CREATININE 10.66* 10.93*  --  9.51*  CALCIUM 9.1 9.3  --  9.4  MG  --  2.4  --   --   PHOS  --  4.4  --   --     Liver Function Tests: Recent Labs  Lab 09/12/18 0029  AST 30  ALT 24  ALKPHOS 135*  BILITOT 1.0  PROT 7.7  ALBUMIN 4.0   No results for input(s): LIPASE, AMYLASE in the last 168 hours. No results for input(s): AMMONIA in the last 168 hours.  CBC: Recent Labs  Lab 09/11/18 2341 09/13/18 0420  WBC 13.6* 7.1  NEUTROABS 11.9* 5.5  HGB 10.6*  9.7*  HCT 31.6* 28.2*  MCV 102.3* 98.9  PLT 191 142*    Cardiac Enzymes: Recent Labs  Lab 09/12/18 0029 09/12/18 0501 09/12/18 1055 09/12/18 1649  CKTOTAL  --  218 205  --   TROPONINI 0.05*  --  0.32* 0.32*    BNP: Invalid input(s): POCBNP  CBG: Recent Labs  Lab 09/12/18 0746 09/12/18 1217 09/12/18 1638 09/12/18 2207 09/13/18 0754  GLUCAP 108* 92 88 107* 49    Microbiology: Results for orders placed or performed during the hospital encounter of 09/11/18  MRSA PCR Screening     Status: None   Collection Time: 09/12/18 11:33 AM  Result Value Ref Range Status   MRSA by PCR NEGATIVE NEGATIVE Final    Comment:        The GeneXpert MRSA Assay (FDA approved for NASAL specimens only), is one component of a comprehensive MRSA colonization surveillance program. It is not intended to diagnose MRSA infection nor to guide or monitor treatment for MRSA infections. Performed at [facility], [address] [place], [place] [address]     Coagulation Studies: No results for input(s): LABPROT, INR in the last 72 hours.  Urinalysis: No results for input(s): COLORURINE, LABSPEC, PHURINE, GLUCOSEU, HGBUR, BILIRUBINUR, KETONESUR, PROTEINUR, UROBILINOGEN, NITRITE, LEUKOCYTESUR in  the last 72 hours.  Invalid input(s): APPERANCEUR    Imaging: Dg Chest Portable 1 View  Result Date: 09/11/2018 CLINICAL DATA:  Shortness of breath for 2 days EXAM: PORTABLE CHEST 1 VIEW COMPARISON:  05/06/2017 FINDINGS: Cardiac shadow is prominent. Increased vascular congestion is noted consistent with volume overload related to the missed dialysis therapy. Mild interstitial edema is noted. No focal infiltrate is seen. No effusion is noted. No bony abnormality is noted. IMPRESSION: Vascular congestion and interstitial edema consistent with volume overload. Electronically Signed   By: Inez Catalina M.D.   On: 09/11/2018 23:43     Medications:   . sodium chloride     . amLODipine  10  mg Oral Daily  . aspirin EC  81 mg Oral Daily  . calcium acetate  1,334 mg Oral TID WC  . carbamazepine  1,000 mg Oral BH-q7a  . cholecalciferol  1,000 Units Oral Daily  . cloNIDine  0.1 mg Oral BID  . ferrous sulfate  325 mg Oral Daily  . fluPHENAZine  2.5 mg Oral BID  . heparin  5,000 Units Subcutaneous Q8H  . hydrALAZINE  75 mg Oral Q8H  . insulin aspart  0-5 Units Subcutaneous QHS  . insulin aspart  0-9 Units Subcutaneous TID WC  . multivitamin  1 tablet Oral Daily  . pantoprazole  40 mg Oral Daily  . sodium chloride flush  3 mL Intravenous Q12H  . trihexyphenidyl  2 mg Oral BID  . ascorbic acid  500 mg Oral Daily   sodium chloride, acetaminophen, albuterol, cyclobenzaprine, fentaNYL (SUBLIMAZE) injection, hydrALAZINE, labetalol, ondansetron (ZOFRAN) IV, sodium chloride flush, traMADol, zolpidem  Assessment/ Plan:  58 y.o. male with end stage renal disease on hemodialysis, schizophrenia, hypertension, chronic hepatitis C, history of DVT.  Admitted with acute respiratory failure secondary to pulmonary edema.  [place] [facility].   1.  ESRD on HD MWF.  Patient underwent hemodialysis yesterday and tolerated quite well.  We will plan for another dialysis session today for additional ultrafiltration and volume removal.  2.  Acute respiratory failure due to acute pulmonary edema.  Due to high fluid gain over the weekend.  Significantly improved as compared to admission.  We will plan for additional dialysis for volume removal today.  3.  Anemia of chronic kidney disease.  Hemoglobin slightly down to 9.7.  No urgent indication to restart Epogen at this time.  We can resume this as an outpatient.  4.  Hyperkalemia.  Potassium down to 4.5 today.  Continue to monitor.  5.  Hypertension.  Increase clonidine to 0.1 mg 3 times daily.  Also add carvedilol 6.25 mg p.o. twice daily to his medication regimen.  I have advised   LOS: 1 Oral Remache 4/14/202012:00 PM

## 2018-09-13 NOTE — Progress Notes (Signed)
Patient resting comfortably on 6L [place]. Will continue to monitor. Bipap on Standby in patient room.

## 2018-09-13 NOTE — Discharge Summary (Signed)
[place] [facility] [place] NAME: Todd Brown    MR#:  [id number]  DATE OF BIRTH:  1960/10/11  DATE OF ADMISSION:  09/11/2018 ADMITTING PHYSICIAN: Christel Mormon, MD  DATE OF DISCHARGE: 09/13/2018  PRIMARY CARE PHYSICIAN: System, Pcp Not In    ADMISSION DIAGNOSIS:  Acute respiratory distress [R06.03] Hypertensive emergency [I16.1] Acute on chronic congestive heart failure, unspecified heart failure type ([place]) [I50.9]  DISCHARGE DIAGNOSIS:  Active Problems:   Acute pulmonary edema ([place])   SECONDARY DIAGNOSIS:   Past Medical History:  Diagnosis Date  . Anemia   . Diabetes mellitus without complication ([place])   . Dialysis patient [facility])    Mon. -Wed.- Fri  . DVT (deep venous thrombosis) ([place])    cephalic and basolic vein thrombosis  . ESRD (end stage renal disease) ([place])   . Hyperlipidemia   . Malignant hypertension   . Renal artery stenosis ([place])   . Schizophrenia [facility])     HOSPITAL COURSE:   58 y/o male with past medical history significant for end-stage renal disease on hemodialysis, chronic diastolic heart failure with preserved ejection fraction and hypertension who presented to the emergency room due to shortness of breath.  1.  Acute on chronic hypoxic respiratory failure due to acute pulmonary edema in the setting of fluid overload associated with end-stage renal disease: Patient reports that he wears 2 L of oxygen at home and is currently on 2 L of oxygen.  2.  End-stage renal disease on hemodialysis: Patient received dialysis and pulmonary edema has resolved. He will continue with outpatient dialysis schedule  3.  Hypertensive urgency: Patient was restarted on outpatient regimen.  4.  SVT: Patient's heart rate improved with 20 mg IV Cardizem.  5.  Schizophrenia: Patient will continue outpatient regimen  6. Tobacco dependence: Patient is encouraged to quit smoking and willing to attempt to quit was assessed. Patient not  highly motivated at this time.Counseling was provided for 4 minutes.  DISCHARGE CONDITIONS AND DIET:   Stable for discharge on renal diet/diabetic  CONSULTS OBTAINED:  Treatment Team:  Anthonette Legato, MD Teodoro Spray, MD  DRUG ALLERGIES:   Allergies  Allergen Reactions  . Thorazine [Chlorpromazine] Other (See Comments)    Reaction:  Unknown , pt states it makes him feel real bad    DISCHARGE MEDICATIONS:   Allergies as of 09/13/2018      Reactions   Thorazine [chlorpromazine] Other (See Comments)   Reaction:  Unknown , pt states it makes him feel real bad      Medication List    TAKE these medications   acetaminophen 500 MG tablet Commonly known as:  TYLENOL Take 500 mg by mouth every 4 (four) hours as needed for mild pain or moderate pain.   amLODipine 10 MG tablet Commonly known as:  NORVASC Take 1 tablet (10 mg total) by mouth daily.   calcium acetate 667 MG capsule Commonly known as:  PHOSLO Take 2 capsules (1,334 mg total) by mouth 3 (three) times daily with meals.   carbamazepine 200 MG 12 hr tablet Commonly known as:  TEGRETOL XR Take 1,000 mg by mouth every morning.   celecoxib 200 MG capsule Commonly known as:  CELEBREX Take 200 mg by mouth daily as needed for mild pain.   cholecalciferol 1000 units tablet Commonly known as:  VITAMIN D Take 1,000 Units by mouth daily.   cloNIDine 0.3 MG tablet Commonly known as:  CATAPRES Take 0.3 mg by  mouth 3 (three) times daily. What changed:  Another medication with the same name was removed. Continue taking this medication, and follow the directions you see here.   ferrous sulfate 325 (65 FE) MG tablet Take 1 tablet (325 mg total) by mouth daily.   fluPHENAZine 2.5 MG tablet Commonly known as:  PROLIXIN Take 2.5 mg by mouth 2 (two) times daily.   Fluticasone-Salmeterol 500-50 MCG/DOSE Aepb Commonly known as:  ADVAIR Inhale 1 puff into the lungs 2 (two) times daily.   hydrALAZINE 100 MG  tablet Commonly known as:  APRESOLINE Take 1 tablet (100 mg total) by mouth 2 (two) times daily.   minoxidil 10 MG tablet Commonly known as:  LONITEN Take 10 mg by mouth 2 (two) times daily.   multivitamin Tabs tablet Take 1 tablet by mouth daily.   omeprazole 40 MG capsule Commonly known as:  PRILOSEC Take 1 capsule (40 mg total) by mouth daily.   ProAir HFA 108 (90 Base) MCG/ACT inhaler Generic drug:  albuterol Inhale 1-2 puffs into the lungs every 4 (four) hours as needed for wheezing or shortness of breath.   trihexyphenidyl 2 MG tablet Commonly known as:  ARTANE Take 2 mg by mouth 2 (two) times daily.         Today   CHIEF COMPLAINT:  Patient here with SOB breathing better   VITAL SIGNS:  Blood pressure (!) 175/91, pulse 96, temperature 99 F (37.2 C), temperature source Oral, resp. rate 16, height 6\' 3"  (1.905 m), weight 76.2 kg, SpO2 93 %.   REVIEW OF SYSTEMS:  Review of Systems  Constitutional: Negative.  Negative for chills, fever and malaise/fatigue.  HENT: Negative.  Negative for ear discharge, ear pain, hearing loss, nosebleeds and sore throat.   Eyes: Negative.  Negative for blurred vision and pain.  Respiratory: Negative.  Negative for cough, hemoptysis, shortness of breath and wheezing.   Cardiovascular: Negative.  Negative for chest pain, palpitations and leg swelling.  Gastrointestinal: Negative.  Negative for abdominal pain, blood in stool, diarrhea, nausea and vomiting.  Genitourinary: Negative.  Negative for dysuria.  Musculoskeletal: Negative.  Negative for back pain.  Skin: Negative.   Neurological: Negative for dizziness, tremors, speech change, focal weakness, seizures and headaches.  Endo/Heme/Allergies: Negative.  Does not bruise/bleed easily.  Psychiatric/Behavioral: Negative.  Negative for depression, hallucinations and suicidal ideas.     PHYSICAL EXAMINATION:  GENERAL:  58 y.o.-year-old patient lying in the bed with no acute  distress.  NECK:  Supple, no jugular venous distention. No thyroid enlargement, no tenderness.  LUNGS: Normal breath sounds bilaterally, no wheezing, rales,rhonchi  No use of accessory muscles of respiration.  CARDIOVASCULAR: S1, S2 normal. No murmurs, rubs, or gallops.  ABDOMEN: Soft, non-tender, non-distended. Bowel sounds present. No organomegaly or mass.  EXTREMITIES: No pedal edema, cyanosis, or clubbing.  PSYCHIATRIC: The patient is alert and oriented x 3.  SKIN: No obvious rash, lesion, or ulcer.   DATA REVIEW:   CBC Recent Labs  Lab 09/13/18 0420  WBC 7.1  HGB 9.7*  HCT 28.2*  PLT 142*    Chemistries  Recent Labs  Lab 09/12/18 0029 09/12/18 0501  09/13/18 0420  NA 139 137  --  138  K 5.5* 6.3*   < > 4.5  CL 102 101  --  97*  CO2 24 23  --  25  GLUCOSE 121* 123*  --  103*  BUN 64* 68*  --  52*  CREATININE 10.66* 10.93*  --  9.51*  CALCIUM 9.1 9.3  --  9.4  MG  --  2.4  --   --   AST 30  --   --   --   ALT 24  --   --   --   ALKPHOS 135*  --   --   --   BILITOT 1.0  --   --   --    < > = values in this interval not displayed.    Cardiac Enzymes Recent Labs  Lab 09/12/18 0029 09/12/18 1055 09/12/18 1649  TROPONINI 0.05* 0.32* 0.32*    Microbiology Results  @[email or web address] @  RADIOLOGY:  Dg Chest Portable 1 View  Result Date: 09/11/2018 CLINICAL DATA:  Shortness of breath for 2 days EXAM: PORTABLE CHEST 1 VIEW COMPARISON:  05/06/2017 FINDINGS: Cardiac shadow is prominent. Increased vascular congestion is noted consistent with volume overload related to the missed dialysis therapy. Mild interstitial edema is noted. No focal infiltrate is seen. No effusion is noted. No bony abnormality is noted. IMPRESSION: Vascular congestion and interstitial edema consistent with volume overload. Electronically Signed   By: Inez Catalina M.D.   On: 09/11/2018 23:43      Allergies as of 09/13/2018      Reactions   Thorazine [chlorpromazine] Other (See Comments)    Reaction:  Unknown , pt states it makes him feel real bad      Medication List    TAKE these medications   acetaminophen 500 MG tablet Commonly known as:  TYLENOL Take 500 mg by mouth every 4 (four) hours as needed for mild pain or moderate pain.   amLODipine 10 MG tablet Commonly known as:  NORVASC Take 1 tablet (10 mg total) by mouth daily.   calcium acetate 667 MG capsule Commonly known as:  PHOSLO Take 2 capsules (1,334 mg total) by mouth 3 (three) times daily with meals.   carbamazepine 200 MG 12 hr tablet Commonly known as:  TEGRETOL XR Take 1,000 mg by mouth every morning.   celecoxib 200 MG capsule Commonly known as:  CELEBREX Take 200 mg by mouth daily as needed for mild pain.   cholecalciferol 1000 units tablet Commonly known as:  VITAMIN D Take 1,000 Units by mouth daily.   cloNIDine 0.3 MG tablet Commonly known as:  CATAPRES Take 0.3 mg by mouth 3 (three) times daily. What changed:  Another medication with the same name was removed. Continue taking this medication, and follow the directions you see here.   ferrous sulfate 325 (65 FE) MG tablet Take 1 tablet (325 mg total) by mouth daily.   fluPHENAZine 2.5 MG tablet Commonly known as:  PROLIXIN Take 2.5 mg by mouth 2 (two) times daily.   Fluticasone-Salmeterol 500-50 MCG/DOSE Aepb Commonly known as:  ADVAIR Inhale 1 puff into the lungs 2 (two) times daily.   hydrALAZINE 100 MG tablet Commonly known as:  APRESOLINE Take 1 tablet (100 mg total) by mouth 2 (two) times daily.   minoxidil 10 MG tablet Commonly known as:  LONITEN Take 10 mg by mouth 2 (two) times daily.   multivitamin Tabs tablet Take 1 tablet by mouth daily.   omeprazole 40 MG capsule Commonly known as:  PRILOSEC Take 1 capsule (40 mg total) by mouth daily.   ProAir HFA 108 (90 Base) MCG/ACT inhaler Generic drug:  albuterol Inhale 1-2 puffs into the lungs every 4 (four) hours as needed for wheezing or shortness of breath.    trihexyphenidyl 2 MG tablet Commonly known as:  ARTANE  Take 2 mg by mouth 2 (two) times daily.          Management plans discussed with the patient and he is in agreement. Stable for discharge   Patient should follow up with nephrology CODE STATUS:     Code Status Orders  (From admission, onward)         Start     Ordered   09/12/18 0146  Full code  Continuous     09/12/18 0146        Code Status History    Date Active Date Inactive Code Status Order ID Comments User Context   05/11/2017 0123 05/13/2017 1857 Full Code [id number]  Gorden Harms, MD Inpatient   04/30/2017 2216 05/07/2017 2158 Full Code [id number]  Vaughan Basta, MD Inpatient   08/03/2016 1426 08/18/2016 1547 Full Code [id number]  Wilhelmina Mcardle, MD ED   05/26/2016 1741 05/31/2016 1748 Full Code [id number]  Max Sane, MD Inpatient   05/16/2015 2343 05/20/2015 1852 Full Code [id number]  Hower, Aaron Mose, MD ED   10/20/2014 2101 10/31/2014 2002 Full Code [id number]  Hower, Aaron Mose, MD Inpatient      TOTAL TIME TAKING CARE OF THIS PATIENT: 38 minutes.    Note: This dictation was prepared with Dragon dictation along with smaller phrase technology. Any transcriptional errors that result from this process are unintentional.  Bettey Costa M.D on 09/13/2018 at 10:46 AM  Between 7am to 6pm - Pager - [phone] After 6pm go to [email or web address] - password [email or web address]  Office  [phone]  CC: Primary care physician; System, Pcp Not In

## 2018-09-13 NOTE — Progress Notes (Signed)
HD Tx End   09/13/18 1530  Vital Signs  Pulse Rate 80  Resp 16  BP (!) 171/83  Oxygen Therapy  SpO2 100 %  During Hemodialysis Assessment  Blood Flow Rate (mL/min) 200 mL/min  Arterial Pressure (mmHg) -100 mmHg  Venous Pressure (mmHg) 140 mmHg  Transmembrane Pressure (mmHg) 70 mmHg  Ultrafiltration Rate (mL/min) 880 mL/min  Dialysate Flow Rate (mL/min) 800 ml/min  Conductivity: Machine  14  HD Safety Checks Performed Yes  Dialysis Fluid Bolus Normal Saline  Bolus Amount (mL) 250 mL  Intra-Hemodialysis Comments Tx completed  Fistula / Graft Left Upper arm Arteriovenous fistula  No Placement Date or Time found.   Orientation: Left  Access Location: Upper arm  Access Type: Arteriovenous fistula  Site Condition No complications  Fistula / Graft Assessment Present;Thrill;Bruit  Status Deaccessed  Needle Size 15  Drainage Description None

## 2018-09-13 NOTE — Progress Notes (Signed)
HD Tx Start   09/13/18 1202  Vital Signs  Pulse Rate 90  Resp 15  BP (!) 174/96  BP Location Right Arm  BP Method Automatic  Patient Position (if appropriate) Lying  Oxygen Therapy  SpO2 97 %  During Hemodialysis Assessment  Blood Flow Rate (mL/min) 400 mL/min  Arterial Pressure (mmHg) -110 mmHg  Venous Pressure (mmHg) 230 mmHg  Transmembrane Pressure (mmHg) 70 mmHg  Ultrafiltration Rate (mL/min) 880 mL/min  Dialysate Flow Rate (mL/min) 800 ml/min  Conductivity: Machine  14  HD Safety Checks Performed Yes  Dialysis Fluid Bolus Normal Saline  Bolus Amount (mL) 250 mL  Dialysate Change 2K  Intra-Hemodialysis Comments Tx initiated

## 2018-09-13 NOTE — Progress Notes (Signed)
Post HD Tx  Pt tolerated tx well, BP remains elevated. 3 liters of fluid removed.     09/13/18 1533  Hand-Off documentation  Report given to (Full Name) Jordan Hawks RN  Report received from (Full Name) Trellis Paganini RN  Vital Signs  Temp 98 F (36.7 C)  Temp Source Oral  Pulse Rate 80  Pulse Rate Source Monitor  Resp 18  BP Location Right Arm  BP Method Automatic  Patient Position (if appropriate) Lying  Oxygen Therapy  SpO2 100 %  O2 Device Nasal Cannula  O2 Flow Rate (L/min) 2 L/min  Pain Assessment  Pain Scale 0-10  Pain Score 0  Dialysis Weight  Weight 72.9 kg  Type of Weight Post-Dialysis  Post-Hemodialysis Assessment  Rinseback Volume (mL) 250 mL  KECN 80.4 V  Dialyzer Clearance Lightly streaked  Duration of HD Treatment -hour(s) 3.5 hour(s)  Hemodialysis Intake (mL) 500 mL  UF Total -Machine (mL) 3500 mL  Net UF (mL) 3000 mL  Tolerated HD Treatment Yes  AVG/AVF Arterial Site Held (minutes) 10 minutes  AVG/AVF Venous Site Held (minutes) 10 minutes

## 2018-09-13 NOTE — TOC Transition Note (Signed)
Transition of Care [facility]) - CM/SW Discharge Note   Patient Details  Name: Todd Brown MRN: [id number] Date of Birth: 1960-09-10  [facility] Care [facility]) CM/SW Contact:  Beverly Sessions, RN Phone Number: 09/13/2018, 4:37 PM   Clinical Narrative:    Patient to discharge home today.  Patient states that he lives at home alone.  Patient is followed by [facility] team.  While RNCM in the room patient contacted Elmo Putt from [facility] to arrange transportation home.  Patient has chronic O2 through [employer].  Patient provided portable tank for discharge.  Patient states that [facility] takes him to his appointments, and [facility] takes him to HD.  Patient denies missing any HD sessions.  Patient states that over the weekend "I drank to much fluid and ate ham and cheese.  Then I had to call the ambulance to come an get me".  [facility] HD liaison notified of admission and discharge.   PCP at [facility], but is not able to recall which provider he sees.  [place].  Patient denies any issues obtaining medications.  States that [facility] delivers them to his home.  Patient declines any home health services at discharge.  Patient has had home health in the past but states "I already have my nurse coming out from [facility], I don't want anyone else coming to the home"  [facility] team member to pick up patient for discharge at 4:30  Final next level of care: Home/Self Care Barriers to Discharge: Continued Medical Work up   Patient Goals and CMS Choice        Discharge Placement                       Discharge Plan and Services   Discharge Planning Services: CM Consult                HH Arranged: Patient Refused [facility]     Social Determinants of Health (SDOH) Interventions     Readmission Risk Interventions No flowsheet data found.

## 2018-09-13 NOTE — Progress Notes (Signed)
Pre HD Tx    09/13/18 1157  Hand-Off documentation  Report given to (Full Name) Louretta Shorten RN  Report received from (Full Name) Verdis Prime RN  Vital Signs  Temp 98.7 F (37.1 C)  Temp Source Oral  Pulse Rate 92  Resp 16  BP (!) 176/106  BP Location Right Arm  BP Method Automatic  Patient Position (if appropriate) Lying  Oxygen Therapy  SpO2 96 %  O2 Device Nasal Cannula  O2 Flow Rate (L/min) 2 L/min  Pain Assessment  Pain Score 0  Dialysis Weight  Weight 75.9 kg  Type of Weight Pre-Dialysis  Education / Care Plan  Dialysis Education Provided Yes  Documented Education in Care Plan Yes  Outpatient Plan of Care Reviewed and on Chart Yes  Fistula / Graft Left Upper arm Arteriovenous fistula  No Placement Date or Time found.   Orientation: Left  Access Location: Upper arm  Access Type: Arteriovenous fistula  Site Condition No complications  Fistula / Graft Assessment Present;Thrill;Bruit  Status Accessed  Needle Size 15

## 2018-09-13 NOTE — TOC Initial Note (Addendum)
Transition of Care [facility]) - Initial/Assessment Note    Patient Details  Name: Todd Brown MRN: [id number] Date of Birth: 04-14-1961  [facility]) CM/SW Contact:    Beverly Sessions, RN Phone Number: 09/13/2018, 11:55 AM  Clinical Narrative:                    RNCM attempted to assess patient.  Patient off the floor for HD.  Will attempt when he returns to floor. Elver[facility] Bicker dialysis liaison notified of admission.         Patient Goals and CMS Choice        Expected Discharge Plan and Services           Expected Discharge Date: 09/13/18                        Prior Living Arrangements/Services                       Activities of Daily Living Home Assistive Devices/Equipment: Kasandra Knudsen (specify quad or straight) ADL Screening (condition at time of admission) Patient's cognitive ability adequate to safely complete daily activities?: Yes Is the patient deaf or have difficulty hearing?: No Does the patient have difficulty seeing, even when wearing glasses/contacts?: No Does the patient have difficulty concentrating, remembering, or making decisions?: No Patient able to express need for assistance with ADLs?: Yes Does the patient have difficulty dressing or bathing?: Yes Independently performs ADLs?: No Communication: Independent Dressing (OT): Needs assistance Is this a change from baseline?: Pre-admission baseline Grooming: Needs assistance Is this a change from baseline?: Pre-admission baseline Feeding: Independent Bathing: Needs assistance Is this a change from baseline?: Pre-admission baseline Toileting: Needs assistance Is this a change from baseline?: Pre-admission baseline In/Out Bed: Needs assistance Is this a change from baseline?: Pre-admission baseline Walks in Home: Needs assistance Is this a change from baseline?: Pre-admission baseline Does the patient have difficulty walking or climbing stairs?: Yes Weakness of Legs:  Both Weakness of Arms/Hands: Both  Permission Sought/Granted                  Emotional Assessment              Admission diagnosis:  Acute respiratory distress [R06.03] Hypertensive emergency [I16.1] Acute on chronic congestive heart failure, unspecified heart failure type [facility]) [I50.9] Patient Active Problem List   Diagnosis Date Noted  . Acute pulmonary edema ([place]) 09/12/2018  . Scrotal edema 05/10/2017  . Symptomatic anemia 04/30/2017  . Anticoagulated on Coumadin 09/14/2016  . Cardiac arrest ([place]) 08/03/2016  . Sepsis ([place]) 05/26/2016  . Dialysis patient ([place]) 04/15/2016  . End stage renal disease ([place]) 02/12/2016  . Hyperkalemia 05/20/2015  . Hepatitis C 09/26/2013  . Schizophrenia ([place]) 03/23/2013  . Essential hypertension, benign 03/23/2013  . Chronic kidney disease 03/23/2013   PCP:  System, Pcp Not In Pharmacy:   [facility], [place] - [address] [place] [address] Phone: [phone] Fax: [phone]  MED[facility] [place], [place] - [address] [place] [address] Phone: [phone] Fax: [phone]     Social Determinants of Health (SDOH) Interventions    Readmission Risk Interventions No flowsheet data found.

## 2018-09-13 NOTE — Progress Notes (Signed)
Patient Name: Todd Brown Date of Encounter: 09/13/2018  Hospital Problem List     Active Problems:   Acute pulmonary edema [facility])    Patient Profile     Patient with end-stage renal disease on hemodialysis, history of diastolic heart failure, accelerated hypertension admitted with pulmonary edema.  Patient is a difficult historian.  Dialyzed yesterday but states he feels like he needs dialyzed today.  Systolic blood pressure remains quite elevated.  Currently is on amlodipine 10 mg daily, clonidine 0.1 mg twice daily hydralazine 75 mg every 8, topical nitrates.  Denies chest pain but complains of some shortness of breath.  Resting comfortably on 6 L.  Subjective   I feel like I need dialyzed today.  Complains of shortness of breath.  Inpatient Medications    . amLODipine  10 mg Oral Daily  . aspirin EC  81 mg Oral Daily  . calcium acetate  1,334 mg Oral TID WC  . carbamazepine  1,000 mg Oral BH-q7a  . cholecalciferol  1,000 Units Oral Daily  . cloNIDine  0.1 mg Oral BID  . ferrous sulfate  325 mg Oral Daily  . fluPHENAZine  2.5 mg Oral BID  . heparin  5,000 Units Subcutaneous Q8H  . hydrALAZINE  75 mg Oral Q8H  . insulin aspart  0-5 Units Subcutaneous QHS  . insulin aspart  0-9 Units Subcutaneous TID WC  . multivitamin  1 tablet Oral Daily  . pantoprazole  40 mg Oral Daily  . sodium chloride flush  3 mL Intravenous Q12H  . trihexyphenidyl  2 mg Oral BID  . ascorbic acid  500 mg Oral Daily    Vital Signs    Vitals:   09/13/18 0400 09/13/18 0500 09/13/18 0527 09/13/18 0700  BP: (!) 165/87 (!) 167/95 (!) 157/90 (!) 175/91  Pulse: 85 100 92 96  Resp: 11 16 16    Temp: 98.7 F (37.1 C)  99 F (37.2 C)   TempSrc: Oral  Oral   SpO2: 97% 92% 93%   Weight:  76.2 kg    Height:        Intake/Output Summary (Last 24 hours) at 09/13/2018 0757 Last data filed at 09/13/2018 0100 Gross per 24 hour  Intake 150 ml  Output 4100 ml  Net -3950 ml   Filed Weights    09/12/18 0930 09/12/18 1317 09/13/18 0500  Weight: 79.1 kg 75.4 kg 76.2 kg    Physical Exam    GEN: Well nourished, well developed, in no acute distress.  HEENT: normal.  Neck: Supple, no JVD, carotid bruits, or masses. Cardiac: RRR, no murmurs, rubs, or gallops. No clubbing, cyanosis, edema.  Radials/DP/PT 2+ and equal bilaterally.  Respiratory:  Respirations regular and unlabored, clear to auscultation bilaterally. GI: Soft, nontender, nondistended, BS + x 4. MS: no deformity or atrophy. Skin: warm and dry, no rash. Neuro:  Strength and sensation are intact. Psych: Normal affect.  Labs    CBC Recent Labs    09/11/18 2341 09/13/18 0420  WBC 13.6* 7.1  NEUTROABS 11.9* 5.5  HGB 10.6* 9.7*  HCT 31.6* 28.2*  MCV 102.3* 98.9  PLT 191 409*   Basic Metabolic Panel Recent Labs    09/12/18 0501 09/12/18 1649 09/13/18 0420  NA 137  --  138  K 6.3* 4.2 4.5  CL 101  --  97*  CO2 23  --  25  GLUCOSE 123*  --  103*  BUN 68*  --  52*  CREATININE 10.93*  --  9.51*  CALCIUM 9.3  --  9.4  MG 2.4  --   --   PHOS 4.4  --   --    Liver Function Tests Recent Labs    09/12/18 0029  AST 30  ALT 24  ALKPHOS 135*  BILITOT 1.0  PROT 7.7  ALBUMIN 4.0   No results for input(s): LIPASE, AMYLASE in the last 72 hours. Cardiac Enzymes Recent Labs    09/12/18 0029 09/12/18 0501 09/12/18 1055 09/12/18 1649  CKTOTAL  --  218 205  --   TROPONINI 0.05*  --  0.32* 0.32*   BNP Recent Labs    09/11/18 2341  BNP 1,176.0*   D-Dimer No results for input(s): DDIMER in the last 72 hours. Hemoglobin A1C Recent Labs    09/12/18 0501  HGBA1C 5.0   Fasting Lipid Panel No results for input(s): CHOL, HDL, LDLCALC, TRIG, CHOLHDL, LDLDIRECT in the last 72 hours. Thyroid Function Tests No results for input(s): TSH, T4TOTAL, T3FREE, THYROIDAB in the last 72 hours.  Invalid input(s): FREET3  Telemetry    Normal sinus rhythm ECG      Radiology    Dg Chest Portable 1  View  Result Date: 09/11/2018 CLINICAL DATA:  Shortness of breath for 2 days EXAM: PORTABLE CHEST 1 VIEW COMPARISON:  05/06/2017 FINDINGS: Cardiac shadow is prominent. Increased vascular congestion is noted consistent with volume overload related to the missed dialysis therapy. Mild interstitial edema is noted. No focal infiltrate is seen. No effusion is noted. No bony abnormality is noted. IMPRESSION: Vascular congestion and interstitial edema consistent with volume overload. Electronically Signed   By: Inez Catalina M.D.   On: 09/11/2018 23:43    Assessment & Plan    58 year old male with multiple medical problems including probable schizophrenia, history of end-stage renal disease on hemodialysis, history of congestive heart failure with evidence of diastolic dysfunction, admitted with increasing shortness of breath and evidence of pulmonary edema.  He was also significantly hypertensive.  Underwent hemodialysis yesterday with improvement however he still says he is short of breath.  Significantly hypertensive.  Borderline troponin elevation secondary to demand and renal insufficiency.  No evidence of acute coronary syndrome.  Hypertension-continue to aggressively treat.  Agree with starting clonidine.  Will follow for response today and may need to increase this.  Would continue with amlodipine and hydralazine as well as topical nitrates.  End-stage renal disease-appreciate nephrology input.  Pulmonary edema.  Clinically looks improved although patient still states he feels short of breath.  We will follow and consider chest x-ray to evaluate for any improvement.  Further hemodialysis based on nephrology opinion.  Signed, Javier Docker Stephanne Greeley MD 09/13/2018, 7:57 AM  Pager: [phone]

## 2018-09-13 NOTE — Progress Notes (Signed)
Discharge order received. Patient is alert and oriented. Vital signs stable . No signs of acute distress. Discharge instructions given. Patient verbalized understanding. No other issues noted at this time. Transported by [employer].

## 2018-10-08 ENCOUNTER — Inpatient Hospital Stay
Admission: EM | Admit: 2018-10-08 | Discharge: 2018-10-09 | DRG: 640 | Disposition: A | Discharge status: Home Health | Payer: Medicaid Other | Attending: Internal Medicine | Admitting: Internal Medicine

## 2018-10-08 ENCOUNTER — Other Ambulatory Visit: Payer: Self-pay

## 2018-10-08 ENCOUNTER — Encounter: Payer: Self-pay | Admitting: Emergency Medicine

## 2018-10-08 ENCOUNTER — Emergency Department: Payer: Medicaid Other

## 2018-10-08 DIAGNOSIS — N186 End stage renal disease: Secondary | ICD-10-CM | POA: Diagnosis present

## 2018-10-08 DIAGNOSIS — F17210 Nicotine dependence, cigarettes, uncomplicated: Secondary | ICD-10-CM | POA: Diagnosis present

## 2018-10-08 DIAGNOSIS — F209 Schizophrenia, unspecified: Secondary | ICD-10-CM | POA: Diagnosis present

## 2018-10-08 DIAGNOSIS — E785 Hyperlipidemia, unspecified: Secondary | ICD-10-CM | POA: Diagnosis present

## 2018-10-08 DIAGNOSIS — R0602 Shortness of breath: Secondary | ICD-10-CM | POA: Diagnosis present

## 2018-10-08 DIAGNOSIS — Z992 Dependence on renal dialysis: Secondary | ICD-10-CM

## 2018-10-08 DIAGNOSIS — R0600 Dyspnea, unspecified: Secondary | ICD-10-CM

## 2018-10-08 DIAGNOSIS — N2581 Secondary hyperparathyroidism of renal origin: Secondary | ICD-10-CM | POA: Diagnosis present

## 2018-10-08 DIAGNOSIS — E875 Hyperkalemia: Secondary | ICD-10-CM | POA: Diagnosis present

## 2018-10-08 DIAGNOSIS — Z9115 Patient's noncompliance with renal dialysis: Secondary | ICD-10-CM

## 2018-10-08 DIAGNOSIS — E8770 Fluid overload, unspecified: Secondary | ICD-10-CM | POA: Diagnosis present

## 2018-10-08 DIAGNOSIS — Z86718 Personal history of other venous thrombosis and embolism: Secondary | ICD-10-CM | POA: Diagnosis not present

## 2018-10-08 DIAGNOSIS — J9600 Acute respiratory failure, unspecified whether with hypoxia or hypercapnia: Secondary | ICD-10-CM | POA: Diagnosis present

## 2018-10-08 DIAGNOSIS — E1122 Type 2 diabetes mellitus with diabetic chronic kidney disease: Secondary | ICD-10-CM | POA: Diagnosis present

## 2018-10-08 DIAGNOSIS — Z20828 Contact with and (suspected) exposure to other viral communicable diseases: Secondary | ICD-10-CM | POA: Diagnosis present

## 2018-10-08 DIAGNOSIS — I701 Atherosclerosis of renal artery: Secondary | ICD-10-CM | POA: Diagnosis present

## 2018-10-08 DIAGNOSIS — D631 Anemia in chronic kidney disease: Secondary | ICD-10-CM | POA: Diagnosis present

## 2018-10-08 DIAGNOSIS — Z888 Allergy status to other drugs, medicaments and biological substances status: Secondary | ICD-10-CM

## 2018-10-08 DIAGNOSIS — I120 Hypertensive chronic kidney disease with stage 5 chronic kidney disease or end stage renal disease: Secondary | ICD-10-CM | POA: Diagnosis present

## 2018-10-08 DIAGNOSIS — I160 Hypertensive urgency: Secondary | ICD-10-CM | POA: Diagnosis present

## 2018-10-08 LAB — COMPREHENSIVE METABOLIC PANEL
ALT: 24 U/L (ref 0–44)
AST: 25 U/L (ref 15–41)
Albumin: 3.7 g/dL (ref 3.5–5.0)
Alkaline Phosphatase: 123 U/L (ref 38–126)
Anion gap: 10 (ref 5–15)
BUN: 44 mg/dL — ABNORMAL HIGH (ref 6–20)
CO2: 29 mmol/L (ref 22–32)
Calcium: 9.3 mg/dL (ref 8.9–10.3)
Chloride: 101 mmol/L (ref 98–111)
Creatinine, Ser: 8.18 mg/dL — ABNORMAL HIGH (ref 0.61–1.24)
GFR calc Af Amer: 8 mL/min — ABNORMAL LOW (ref >60)
GFR calc non Af Amer: 7 mL/min — ABNORMAL LOW (ref >60)
Glucose, Bld: 104 mg/dL — ABNORMAL HIGH (ref 70–99)
Potassium: 6.1 mmol/L — ABNORMAL HIGH (ref 3.5–5.1)
Sodium: 140 mmol/L (ref 135–145)
Total Bilirubin: 0.6 mg/dL (ref 0.3–1.2)
Total Protein: 7.5 g/dL (ref 6.5–8.1)

## 2018-10-08 LAB — CBC WITH DIFFERENTIAL/PLATELET
Abs Immature Granulocytes: 0.02 10*3/uL (ref 0.00–0.07)
Basophils Absolute: 0 10*3/uL (ref 0.0–0.1)
Basophils Relative: 1 %
Eosinophils Absolute: 0.2 10*3/uL (ref 0.0–0.5)
Eosinophils Relative: 3 %
HCT: 30.1 % — ABNORMAL LOW (ref 39.0–52.0)
Hemoglobin: 10 g/dL — ABNORMAL LOW (ref 13.0–17.0)
Immature Granulocytes: 0 %
Lymphocytes Relative: 21 %
Lymphs Abs: 1.3 10*3/uL (ref 0.7–4.0)
MCH: 34 pg (ref 26.0–34.0)
MCHC: 33.2 g/dL (ref 30.0–36.0)
MCV: 102.4 fL — ABNORMAL HIGH (ref 80.0–100.0)
Monocytes Absolute: 0.5 10*3/uL (ref 0.1–1.0)
Monocytes Relative: 8 %
Neutro Abs: 4.1 10*3/uL (ref 1.7–7.7)
Neutrophils Relative %: 67 %
Platelets: 208 10*3/uL (ref 150–400)
RBC: 2.94 MIL/uL — ABNORMAL LOW (ref 4.22–5.81)
RDW: 14.1 % (ref 11.5–15.5)
WBC: 6.1 10*3/uL (ref 4.0–10.5)
nRBC: 0 % (ref 0.0–0.2)

## 2018-10-08 LAB — SARS CORONAVIRUS 2 BY RT PCR (HOSPITAL ORDER, PERFORMED IN ~~LOC~~ HOSPITAL LAB): SARS Coronavirus 2: NEGATIVE

## 2018-10-08 LAB — TROPONIN I: Troponin I: <0.03 ng/mL (ref <0.03)

## 2018-10-08 MED ORDER — FLUPHENAZINE HCL 2.5 MG PO TABS
2.5000 mg | ORAL_TABLET | Freq: Two times a day (BID) | ORAL | Status: DC
  Administered 2018-10-09 (×2): 2.5 mg via ORAL
  Filled 2018-10-08 (×3): qty 1

## 2018-10-08 MED ORDER — ACETAMINOPHEN 500 MG PO TABS: 500.0000 mg | ORAL_TABLET | ORAL | Status: DC | PRN

## 2018-10-08 MED ORDER — FERROUS SULFATE 325 (65 FE) MG PO TABS
325.0000 mg | ORAL_TABLET | Freq: Every day | ORAL | Status: DC
  Administered 2018-10-09: 08:00:00 325 mg via ORAL
  Filled 2018-10-08: qty 1

## 2018-10-08 MED ORDER — MINOXIDIL 2.5 MG PO TABS
10.0000 mg | ORAL_TABLET | Freq: Two times a day (BID) | ORAL | Status: DC
  Administered 2018-10-08 – 2018-10-09 (×2): 10 mg via ORAL
  Filled 2018-10-08 (×3): qty 4

## 2018-10-08 MED ORDER — VITAMIN D 25 MCG (1000 UNIT) PO TABS
1000.0000 [IU] | ORAL_TABLET | Freq: Every day | ORAL | Status: DC
  Administered 2018-10-09: 08:00:00 1000 [IU] via ORAL
  Filled 2018-10-08: qty 1

## 2018-10-08 MED ORDER — DOCUSATE SODIUM 100 MG PO CAPS: 100.0000 mg | ORAL_CAPSULE | Freq: Two times a day (BID) | ORAL | Status: DC | PRN

## 2018-10-08 MED ORDER — ALBUTEROL SULFATE (2.5 MG/3ML) 0.083% IN NEBU
2.5000 mg | INHALATION_SOLUTION | RESPIRATORY_TRACT | Status: DC | PRN
  Administered 2018-10-09: 09:00:00 2.5 mg via RESPIRATORY_TRACT
  Filled 2018-10-08: qty 3

## 2018-10-08 MED ORDER — CHLORHEXIDINE GLUCONATE CLOTH 2 % EX PADS
6.0000 | MEDICATED_PAD | Freq: Every day | CUTANEOUS | Status: DC
  Filled 2018-10-08: qty 6

## 2018-10-08 MED ORDER — PANTOPRAZOLE SODIUM 40 MG PO TBEC
40.0000 mg | DELAYED_RELEASE_TABLET | Freq: Every day | ORAL | Status: DC
  Administered 2018-10-09: 08:00:00 40 mg via ORAL
  Filled 2018-10-08: qty 1

## 2018-10-08 MED ORDER — CALCIUM ACETATE (PHOS BINDER) 667 MG PO CAPS
1334.0000 mg | ORAL_CAPSULE | Freq: Three times a day (TID) | ORAL | Status: DC
  Administered 2018-10-09: 08:00:00 1334 mg via ORAL
  Filled 2018-10-08: qty 2

## 2018-10-08 MED ORDER — CLONIDINE HCL 0.1 MG PO TABS
0.3000 mg | ORAL_TABLET | Freq: Three times a day (TID) | ORAL | Status: DC
  Administered 2018-10-08 – 2018-10-09 (×2): 0.3 mg via ORAL
  Filled 2018-10-08 (×2): qty 3

## 2018-10-08 MED ORDER — CARBAMAZEPINE ER 200 MG PO TB12
1000.0000 mg | ORAL_TABLET | ORAL | Status: DC
  Administered 2018-10-09: 1000 mg via ORAL
  Filled 2018-10-08: qty 5

## 2018-10-08 MED ORDER — HYDRALAZINE HCL 50 MG PO TABS
100.0000 mg | ORAL_TABLET | Freq: Two times a day (BID) | ORAL | Status: DC
  Administered 2018-10-08 – 2018-10-09 (×2): 100 mg via ORAL
  Filled 2018-10-08 (×2): qty 2

## 2018-10-08 MED ORDER — RENA-VITE PO TABS
1.0000 | ORAL_TABLET | Freq: Every day | ORAL | Status: DC
  Administered 2018-10-09: 08:00:00 1 via ORAL
  Filled 2018-10-08: qty 1

## 2018-10-08 MED ORDER — CELECOXIB 200 MG PO CAPS: 200.0000 mg | ORAL_CAPSULE | Freq: Every day | ORAL | Status: DC | PRN

## 2018-10-08 MED ORDER — HEPARIN SODIUM (PORCINE) 5000 UNIT/ML IJ SOLN
5000.0000 [IU] | Freq: Three times a day (TID) | INTRAMUSCULAR | Status: DC
  Administered 2018-10-08 – 2018-10-09 (×2): 5000 [IU] via SUBCUTANEOUS
  Filled 2018-10-08 (×2): qty 1

## 2018-10-08 MED ORDER — TRIHEXYPHENIDYL HCL 2 MG PO TABS
2.0000 mg | ORAL_TABLET | Freq: Two times a day (BID) | ORAL | Status: DC
  Administered 2018-10-08 – 2018-10-09 (×2): 2 mg via ORAL
  Filled 2018-10-08 (×3): qty 1

## 2018-10-08 MED ORDER — MOMETASONE FURO-FORMOTEROL FUM 200-5 MCG/ACT IN AERO
2.0000 | INHALATION_SPRAY | Freq: Two times a day (BID) | RESPIRATORY_TRACT | Status: DC
  Administered 2018-10-08 – 2018-10-09 (×2): 2 via RESPIRATORY_TRACT
  Filled 2018-10-08: qty 8.8

## 2018-10-08 MED ORDER — AMLODIPINE BESYLATE 10 MG PO TABS
10.0000 mg | ORAL_TABLET | Freq: Every day | ORAL | Status: DC
  Administered 2018-10-09: 08:00:00 10 mg via ORAL
  Filled 2018-10-08: qty 1

## 2018-10-08 NOTE — Progress Notes (Signed)
HD Tx started w/o complication    24/23/53 1911  Vital Signs  BP (!) 200/101  During Hemodialysis Assessment  Blood Flow Rate (mL/min) 400 mL/min  Arterial Pressure (mmHg) -160 mmHg  Venous Pressure (mmHg) 180 mmHg  Transmembrane Pressure (mmHg) 60 mmHg  Ultrafiltration Rate (mL/min) 1360 mL/min  Dialysate Flow Rate (mL/min) 600 ml/min  Conductivity: Machine  13.8  HD Safety Checks Performed Yes  Dialysis Fluid Bolus Normal Saline  Bolus Amount (mL) 250 mL  Intra-Hemodialysis Comments Tx initiated

## 2018-10-08 NOTE — ED Notes (Signed)
ED TO INPATIENT HANDOFF REPORT  ED Nurse Name and Phone #: Janett Billow 59  S Name/Age/Gender [place] 58 y.o. male Room/Bed: ED19A/ED19A  Code Status   Code Status: Prior  Home/SNF/Other Home Patient oriented to: self, place, time and situation Is this baseline? Yes   Triage Complete: Triage complete  Chief Complaint Fluid Overload  Triage Note Pt presents to ED via [facility] from dialysis center. EMS reports pt went to dialysis yesterday as scheduled but did not complete treatment. Pt went to dialysis today without appointment d/t feeling short of breath and found center already closed.    Allergies Allergies  Allergen Reactions  . Thorazine [Chlorpromazine] Other (See Comments)    Reaction:  Unknown , pt states it makes him feel real bad    Level of Care/Admitting Diagnosis ED Disposition    ED Disposition Condition [place]: [place] [[id number]]  Level of Care: Med-Surg [16]  Covid Evaluation: N/A  Diagnosis: Fluid overload [272536]  Admitting Physician: Vaughan Basta [[id number]]  Attending Physician: Vaughan Basta [phone]  Estimated length of stay: past midnight tomorrow  Certification:: I certify this patient will need inpatient services for at least 2 midnights  PT Class (Do Not Modify): Inpatient [101]  PT Acc Code (Do Not Modify): Private [1]       B Medical/Surgery History Past Medical History:  Diagnosis Date  . Anemia   . Diabetes mellitus without complication ([place])   . Dialysis patient [facility])    Mon. -Wed.- Fri  . 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 ESRD (end stage renal disease) ([place])   . Hyperlipidemia   . Malignant hypertension   . Renal artery stenosis ([place])   . Schizophrenia [facility])    Past Surgical History:  Procedure Laterality Date  . A/V FISTULAGRAM N/A 08/06/2016   Procedure: A/V Fistulagram;  Surgeon: Algernon Huxley, MD;  Location:  [place] CV LAB;  Service: Cardiovascular;  Laterality: N/A;  . A/V SHUNT INTERVENTION N/A 08/06/2016   Procedure: A/V Shunt Intervention;  Surgeon: Algernon Huxley, MD;  Location: [place] CV LAB;  Service: Cardiovascular;  Laterality: N/A;  . AV FISTULA PLACEMENT Left 12/26/2014   Procedure: ARTERIOVENOUS (AV) FISTULA CREATION;  Surgeon: Algernon Huxley, MD;  Location: [facility] ORS;  Service: Vascular;  Laterality: Left;  . ESOPHAGOGASTRODUODENOSCOPY (EGD) WITH PROPOFOL N/A 05/06/2017   Procedure: ESOPHAGOGASTRODUODENOSCOPY (EGD) WITH PROPOFOL;  Surgeon: Jonathon Bellows, MD;  Location: [facility] ENDOSCOPY;  Service: Gastroenterology;  Laterality: N/A;  . INSERTION OF DIALYSIS CATHETER Right   . PERIPHERAL VASCULAR CATHETERIZATION N/A 10/22/2014   Procedure: Dialysis/Perma Catheter Insertion;  Surgeon: Algernon Huxley, MD;  Location: [place] CV LAB;  Service: Cardiovascular;  Laterality: N/A;  . PERIPHERAL VASCULAR CATHETERIZATION N/A 02/28/2015   Procedure: Dialysis/Perma Catheter Removal;  Surgeon: Algernon Huxley, MD;  Location: [place] CV LAB;  Service: Cardiovascular;  Laterality: N/A;  . Repair fx left lower leg     yrs ago (age 22)     A IV Location/Drains/Wounds Patient Lines/Drains/Airways Status   Active Line/Drains/Airways    Name:   Placement date:   Placement time:   Site:   Days:   Peripheral IV 10/08/18 Right Antecubital   10/08/18    1735    Antecubital   less than 1   Fistula / Graft Left Upper arm Arteriovenous fistula   -    -    Upper  arm             Intake/Output Last 24 hours No intake or output data in the 24 hours ending 10/08/18 1833  Labs/Imaging Results for orders placed or performed during the hospital encounter of 10/08/18 (from the past 48 hour(s))  CBC with Differential/Platelet     Status: Abnormal   Collection Time: 10/08/18  4:31 PM  Result Value Ref Range   WBC 6.1 4.0 - 10.5 K/uL   RBC 2.94 (L) 4.22 - 5.81 MIL/uL   Hemoglobin 10.0 (L) 13.0 - 17.0 g/dL    HCT 30.1 (L) 39.0 - 52.0 %   MCV 102.4 (H) 80.0 - 100.0 fL   MCH 34.0 26.0 - 34.0 pg   MCHC 33.2 30.0 - 36.0 g/dL   RDW 14.1 11.5 - 15.5 %   Platelets 208 150 - 400 K/uL   nRBC 0.0 0.0 - 0.2 %   Neutrophils Relative % 67 %   Neutro Abs 4.1 1.7 - 7.7 K/uL   Lymphocytes Relative 21 %   Lymphs Abs 1.3 0.7 - 4.0 K/uL   Monocytes Relative 8 %   Monocytes Absolute 0.5 0.1 - 1.0 K/uL   Eosinophils Relative 3 %   Eosinophils Absolute 0.2 0.0 - 0.5 K/uL   Basophils Relative 1 %   Basophils Absolute 0.0 0.0 - 0.1 K/uL   Immature Granulocytes 0 %   Abs Immature Granulocytes 0.02 0.00 - 0.07 K/uL    Comment: Performed at [facility], [address] [place], [place] [address]  Comprehensive metabolic panel     Status: Abnormal   Collection Time: 10/08/18  4:31 PM  Result Value Ref Range   Sodium 140 135 - 145 mmol/L   Potassium 6.1 (H) 3.5 - 5.1 mmol/L   Chloride 101 98 - 111 mmol/L   CO2 29 22 - 32 mmol/L   Glucose, Bld 104 (H) 70 - 99 mg/dL   BUN 44 (H) 6 - 20 mg/dL   Creatinine, Ser 8.18 (H) 0.61 - 1.24 mg/dL   Calcium 9.3 8.9 - 10.3 mg/dL   Total Protein 7.5 6.5 - 8.1 g/dL   Albumin 3.7 3.5 - 5.0 g/dL   AST 25 15 - 41 U/L   ALT 24 0 - 44 U/L   Alkaline Phosphatase 123 38 - 126 U/L   Total Bilirubin 0.6 0.3 - 1.2 mg/dL   GFR calc non Af Amer 7 (L) >60 mL/min   GFR calc Af Amer 8 (L) >60 mL/min   Anion gap 10 5 - 15    Comment: Performed at [facility], [address] [place], [place] [address]  Troponin I - ONCE - STAT     Status: None   Collection Time: 10/08/18  4:31 PM  Result Value Ref Range   Troponin I <0.03 <0.03 ng/mL    Comment: Performed at [facility], [address], [place], [place] [address]  SARS Coronavirus 2 (CEPHEID - Performed in [place] hospital lab), Hosp Order     Status: None   Collection Time: 10/08/18  4:59 PM  Result Value Ref Range   SARS Coronavirus 2 NEGATIVE NEGATIVE    Comment: (NOTE) If result is  NEGATIVE SARS-CoV-2 target nucleic acids are NOT DETECTED. The SARS-CoV-2 RNA is generally detectable in upper and lower  respiratory specimens during the acute phase of infection. The lowest  concentration of SARS-CoV-2 viral copies this assay can detect is 250  copies / mL. A negative result does  not preclude SARS-CoV-2 infection  and should not be used as the sole basis for treatment or other  patient management decisions.  A negative result may occur with  improper specimen collection / handling, submission of specimen other  than nasopharyngeal swab, presence of viral mutation(s) within the  areas targeted by this assay, and inadequate number of viral copies  (<250 copies / mL). A negative result must be combined with clinical  observations, patient history, and epidemiological information. If result is POSITIVE SARS-CoV-2 target nucleic acids are DETECTED. The SARS-CoV-2 RNA is generally detectable in upper and lower  respiratory specimens dur ing the acute phase of infection.  Positive  results are indicative of active infection with SARS-CoV-2.  Clinical  correlation with patient history and other diagnostic information is  necessary to determine patient infection status.  Positive results do  not rule out bacterial infection or co-infection with other viruses. If result is PRESUMPTIVE POSTIVE SARS-CoV-2 nucleic acids MAY BE PRESENT.   A presumptive positive result was obtained on the submitted specimen  and confirmed on repeat testing.  While 2019 novel coronavirus  (SARS-CoV-2) nucleic acids may be present in the submitted sample  additional confirmatory testing may be necessary for epidemiological  and / or clinical management purposes  to differentiate between  SARS-CoV-2 and other Sarbecovirus currently known to infect humans.  If clinically indicated additional testing with an alternate test  methodology [id number]) is advised. The SARS-CoV-2 RNA is generally  detectable  in upper and lower respiratory sp ecimens during the acute  phase of infection. The expected result is Negative. Fact Sheet for Patients:  StrictlyIdeas.no Fact Sheet for Healthcare Providers: BankingDealers.co.za This test is not yet approved or cleared by the [place] FDA and has been authorized for detection and/or diagnosis of SARS-CoV-2 by FDA under an Emergency Use Authorization (EUA).  This EUA will remain in effect (meaning this test can be used) for the duration of the COVID-19 declaration under Section 564(b)(1) of the Act, 21 U.S.C. section 360bbb-3(b)(1), unless the authorization is terminated or revoked sooner. Performed at [facility], [address] [place], [place] [address]    Dg Chest 1 View  Result Date: 10/08/2018 CLINICAL DATA:  Shortness of Breath EXAM: CHEST  1 VIEW COMPARISON:  09/11/2018 FINDINGS: Cardiac shadow is enlarged stable. Improvement in the degree of vascular congestion is noted when compared with the prior exam. No focal infiltrate is seen. Calcified granuloma is again lung. No bony abnormality is noted. IMPRESSION: Mild vascular congestion but improved from the prior exam. Electronically Signed   By: Inez Catalina M.D.   On: 10/08/2018 16:41    Pending Labs [employer] (From admission, onward)    Start     Ordered   Signed and Occupational hygienist morning,   R     Signed and Held   Signed and Held  CBC  Tomorrow morning,   R     Signed and Held   Signed and Held  CBC  (heparin)  Once,   R    Comments:  Baseline for heparin therapy IF NOT ALREADY DRAWN.  Notify MD if PLT < 100 K.    Signed and Held   Signed and Held  Creatinine, serum  (heparin)  Once,   R    Comments:  Baseline for heparin therapy IF NOT ALREADY DRAWN.    Signed and Held          Vitals/Pain Today's Vitals  10/08/18 1630 10/08/18 1730 10/08/18 1745 10/08/18 1800  BP: (!) 204/91 (!)  199/93  (!) 185/97  Pulse: 73 63 67 (!) 58  Resp: 16 15 15 15   Temp:      TempSrc:      SpO2: 98% 94% 99% 98%  Weight:      Height:      PainSc:        Isolation Precautions No active isolations  Medications Medications  Chlorhexidine Gluconate Cloth 2 % PADS 6 each (has no administration in time range)    Mobility walks Low fall risk   Focused Assessments Cardiac Assessment Handoff:  Cardiac Rhythm: Normal sinus rhythm(with tall peaked t waves) Lab Results  Component Value Date   CKTOTAL 205 09/12/2018   TROPONINI <0.03 10/08/2018   No results found for: DDIMER Does the Patient currently have chest pain? No     R Recommendations: See Admitting Provider Note  Report given to:   Additional Notes:

## 2018-10-08 NOTE — ED Notes (Signed)
Pt requests oxygen saying that he wears oxygen at home when he feels like it. Pt placed on 1L [place] for comfort. No need present at this time.

## 2018-10-08 NOTE — H&P (Addendum)
[facility] [place] NAME: Todd Brown    MR#:  [id number]  DATE OF BIRTH:  02/07/1961  DATE OF ADMISSION:  10/08/2018  PRIMARY CARE PHYSICIAN: System, Pcp Not In   REQUESTING/REFERRING PHYSICIAN: [place]  CHIEF COMPLAINT:   Chief Complaint  Patient presents with  . Shortness of Breath    HISTORY OF PRESENT ILLNESS: Todd Brown  is a 58 y.o. male with a known history of anemia, diabetes, end-stage renal disease on hemodialysis, deep vein thrombosis, hyperlipidemia, hypertension, schizophrenia-did not receive full hemodialysis yesterday and started feeling short of breath so went to his dialysis center today but they were closed.  Came to emergency room and noted to have fluid overload with respiratory distress. His potassium was 6.0.  ER physician spoke to nephrologist for urgent hemodialysis and called hospitalist service for admission.  PAST MEDICAL HISTORY:   Past Medical History:  Diagnosis Date  . Anemia   . Diabetes mellitus without complication ([place])   . Dialysis patient [facility])    Mon. -Wed.- Fri  . DVT (deep venous thrombosis) ([place])    cephalic and basolic vein thrombosis  . ESRD (end stage renal disease) ([place])   . Hyperlipidemia   . Malignant hypertension   . Renal artery stenosis ([place])   . Schizophrenia ([place])     PAST SURGICAL HISTORY:  Past Surgical History:  Procedure Laterality Date  . A/V FISTULAGRAM N/A 08/06/2016   Procedure: A/V Fistulagram;  Surgeon: Algernon Huxley, MD;  Location: [place] [facility];  Service: Cardiovascular;  Laterality: N/A;  . A/V SHUNT INTERVENTION N/A 08/06/2016   Procedure: A/V Shunt Intervention;  Surgeon: Algernon Huxley, MD;  Location: [place] [facility];  Service: Cardiovascular;  Laterality: N/A;  . AV FISTULA PLACEMENT Left 12/26/2014   Procedure: ARTERIOVENOUS (AV) FISTULA CREATION;  Surgeon: Algernon Huxley, MD;  Location: [facility] ORS;  Service: Vascular;  Laterality: Left;  .  ESOPHAGOGASTRODUODENOSCOPY (EGD) WITH PROPOFOL N/A 05/06/2017   Procedure: ESOPHAGOGASTRODUODENOSCOPY (EGD) WITH PROPOFOL;  Surgeon: Jonathon Bellows, MD;  Location: [facility] ENDOSCOPY;  Service: Gastroenterology;  Laterality: N/A;  . INSERTION OF DIALYSIS CATHETER Right   . PERIPHERAL VASCULAR CATHETERIZATION N/A 10/22/2014   Procedure: Dialysis/Perma Catheter Insertion;  Surgeon: Algernon Huxley, MD;  Location: [place] [facility];  Service: Cardiovascular;  Laterality: N/A;  . PERIPHERAL VASCULAR CATHETERIZATION N/A 02/28/2015   Procedure: Dialysis/Perma Catheter Removal;  Surgeon: Algernon Huxley, MD;  Location: [place] [facility];  Service: Cardiovascular;  Laterality: N/A;  . Repair fx left lower leg     yrs ago (age 64)    SOCIAL HISTORY:  Social History   Tobacco Use  . Smoking status: Current Every Day Smoker    Packs/day: 0.50    Types: Cigarettes  . Smokeless tobacco: Never Used  . Tobacco comment: 10 cigars a day  Substance Use Topics  . Alcohol use: No    FAMILY HISTORY:  Family History  Problem Relation Age of Onset  . Diabetes Neg Hx     DRUG ALLERGIES:  Allergies  Allergen Reactions  . Thorazine [Chlorpromazine] Other (See Comments)    Reaction:  Unknown , pt states it makes him feel real bad    REVIEW OF SYSTEMS:   CONSTITUTIONAL: No fever, fatigue or weakness.  EYES: No blurred or double vision.  EARS, NOSE, AND THROAT: No tinnitus or ear pain.  RESPIRATORY: No cough, have shortness of breath, wheezing or hemoptysis.  CARDIOVASCULAR: No chest  pain, orthopnea, edema.  GASTROINTESTINAL: No nausea, vomiting, diarrhea or abdominal pain.  GENITOURINARY: No dysuria, hematuria.  ENDOCRINE: No polyuria, nocturia,  HEMATOLOGY: No anemia, easy bruising or bleeding SKIN: No rash or lesion. MUSCULOSKELETAL: No joint pain or arthritis.   NEUROLOGIC: No tingling, numbness, weakness.  PSYCHIATRY: No anxiety or depression.   MEDICATIONS AT HOME:  Prior to Admission medications    Medication Sig Start Date End Date Taking? Authorizing Provider  amLODipine (NORVASC) 10 MG tablet Take 1 tablet (10 mg total) by mouth daily. 09/13/18  Yes Bettey Costa, MD  calcium acetate (PHOSLO) 667 MG capsule Take 2 capsules (1,334 mg total) by mouth 3 (three) times daily with meals. 05/19/15  Yes Fritzi Mandes, MD  carbamazepine (TEGRETOL XR) 200 MG 12 hr tablet Take 1,000 mg by mouth every morning.   Yes [provider]  celecoxib (CELEBREX) 200 MG capsule Take 200 mg by mouth daily as needed for mild pain.   Yes [provider]  cholecalciferol (VITAMIN D) 1000 UNITS tablet Take 1,000 Units by mouth daily.   Yes [provider]  cloNIDine (CATAPRES) 0.3 MG tablet Take 0.3 mg by mouth 3 (three) times daily.   Yes [provider]  ferrous sulfate 325 (65 FE) MG tablet Take 1 tablet (325 mg total) by mouth daily. 05/19/15  Yes Fritzi Mandes, MD  fluPHENAZine (PROLIXIN) 2.5 MG tablet Take 2.5 mg by mouth 2 (two) times daily.    Yes [provider]  Fluticasone-Salmeterol (ADVAIR) 500-50 MCG/DOSE AEPB Inhale 1 puff into the lungs 2 (two) times daily.   Yes [provider]  hydrALAZINE (APRESOLINE) 100 MG tablet Take 1 tablet (100 mg total) by mouth 2 (two) times daily. 09/13/18  Yes Mody, Ulice Bold, MD  minoxidil (LONITEN) 10 MG tablet Take 10 mg by mouth 2 (two) times daily.   Yes [provider]  multivitamin (RENA-VIT) TABS tablet Take 1 tablet by mouth daily.   Yes [provider]  omeprazole (PRILOSEC) 40 MG capsule Take 1 capsule (40 mg total) by mouth daily. 05/06/17  Yes Henreitta Leber, MD  trihexyphenidyl (ARTANE) 2 MG tablet Take 2 mg by mouth 2 (two) times daily.   Yes [provider]  acetaminophen (TYLENOL) 500 MG tablet Take 500 mg by mouth every 4 (four) hours as needed for mild pain or moderate pain.     [provider]  albuterol (PROAIR HFA) 108 (90 Base) MCG/ACT inhaler Inhale 1-2 puffs into the  lungs every 4 (four) hours as needed for wheezing or shortness of breath.     [provider]      PHYSICAL EXAMINATION:   VITAL SIGNS: Blood pressure (!) 202/107, pulse 84, temperature (P) 98.3 F (36.8 C), temperature source (P) Oral, resp. rate (!) 21, height 6\' 3"  (1.905 m), weight 77.1 kg, SpO2 100 %.  GENERAL:  58 y.o.-year-old patient lying in the bed with no acute distress.  EYES: Pupils equal, round, reactive to light and accommodation. No scleral icterus. Extraocular muscles intact.  HEENT: Head atraumatic, normocephalic. Oropharynx and nasopharynx clear.  NECK:  Supple, no jugular venous distention. No thyroid enlargement, no tenderness.  LUNGS: Normal breath sounds bilaterally, no wheezing, rales,rhonchi or crepitation. No use of accessory muscles of respiration.  CARDIOVASCULAR: S1, S2 normal. No murmurs, rubs, or gallops.  ABDOMEN: Soft, nontender, nondistended. Bowel sounds present. No organomegaly or mass.  EXTREMITIES: No pedal edema, cyanosis, or clubbing.  NEUROLOGIC: Cranial nerves II through XII are intact. Muscle strength 5/5  in all extremities. Sensation intact. Gait not checked.  PSYCHIATRIC: The patient is alert and oriented x 3.  SKIN: No obvious rash, lesion, or ulcer.   LABORATORY PANEL:   CBC Recent Labs  Lab 10/08/18 1631  WBC 6.1  HGB 10.0*  HCT 30.1*  PLT 208  MCV 102.4*  MCH 34.0  MCHC 33.2  RDW 14.1  LYMPHSABS 1.3  MONOABS 0.5  EOSABS 0.2  BASOSABS 0.0   ------------------------------------------------------------------------------------------------------------------  Chemistries  Recent Labs  Lab 10/08/18 1631  NA 140  K 6.1*  CL 101  CO2 29  GLUCOSE 104*  BUN 44*  CREATININE 8.18*  CALCIUM 9.3  AST 25  ALT 24  ALKPHOS 123  BILITOT 0.6   ------------------------------------------------------------------------------------------------------------------ estimated creatinine clearance is 10.9 mL/min (A) (by C-G  formula based on SCr of 8.18 mg/dL (H)). ------------------------------------------------------------------------------------------------------------------ No results for input(s): TSH, T4TOTAL, T3FREE, THYROIDAB in the last 72 hours.  Invalid input(s): FREET3   Coagulation profile No results for input(s): INR, PROTIME in the last 168 hours. ------------------------------------------------------------------------------------------------------------------- No results for input(s): DDIMER in the last 72 hours. -------------------------------------------------------------------------------------------------------------------  Cardiac Enzymes Recent Labs  Lab 10/08/18 1631  TROPONINI <0.03   ------------------------------------------------------------------------------------------------------------------ Invalid input(s): POCBNP  ---------------------------------------------------------------------------------------------------------------  Urinalysis    Component Value Date/Time   COLORURINE YELLOW (A) 05/17/2015 0012   APPEARANCEUR HAZY (A) 05/17/2015 0012   LABSPEC 1.009 05/17/2015 0012   PHURINE 7.0 05/17/2015 0012   GLUCOSEU NEGATIVE 05/17/2015 0012   HGBUR 1+ (A) 05/17/2015 0012   BILIRUBINUR NEGATIVE 05/17/2015 0012   KETONESUR NEGATIVE 05/17/2015 0012   PROTEINUR 30 (A) 05/17/2015 0012   NITRITE NEGATIVE 05/17/2015 0012   LEUKOCYTESUR 3+ (A) 05/17/2015 0012     RADIOLOGY: Dg Chest 1 View  Result Date: 10/08/2018 CLINICAL DATA:  Shortness of Breath EXAM: CHEST  1 VIEW COMPARISON:  09/11/2018 FINDINGS: Cardiac shadow is enlarged stable. Improvement in the degree of vascular congestion is noted when compared with the prior exam. No focal infiltrate is seen. Calcified granuloma is again lung. No bony abnormality is noted. IMPRESSION: Mild vascular congestion but improved from the prior exam. Electronically Signed   By: Inez Catalina M.D.   On: 10/08/2018 16:41     EKG: Orders placed or performed during the hospital encounter of 10/08/18  . ED EKG  . ED EKG  . EKG 12-Lead  . EKG 12-Lead    IMPRESSION AND PLAN:  *Fluid overload, acute respiratory failure, End-stage renal disease on hemodialysis  Missed hemodialysis yesterday. Nephrologist is aware for urgent hemodialysis need tonight.  *Hyperkalemia No EKG changes. ER physician spoke to nephrologist and he suggested not to give any reversing agent for now as he is going to go for dialysis very soon.  *Hypertension Continue home medications.  *Schizophrenia Continue home medication.   *Anemia due to chronic kidney disease Continue to monitor.  * Active smoking Counseled to quit for 4 min, offerred nicotine patch.  All the records are reviewed and case discussed with ED provider. Management plans discussed with the patient, family and they are in agreement.  CODE STATUS: Full code. Code Status History    Date Active Date Inactive Code Status Order ID Comments User Context   09/12/2018 0147 09/13/2018 2046 Full Code [id number]  Sidney Ace Arvella Merles, MD ED   05/11/2017 0123 05/13/2017 1857 Full Code [id number]  Gorden Harms, MD Inpatient   04/30/2017 2216 05/07/2017 2158 Full Code [id number]  Vaughan Basta, MD Inpatient   08/03/2016 1426 08/18/2016 1547 Full Code [id number]  Wilhelmina Mcardle, MD ED   05/26/2016 1741 05/31/2016 1748 Full Code [id number]  Max Sane, MD Inpatient   05/16/2015 2343 05/20/2015 1852 Full Code [id number]  Lytle Butte, MD ED   10/20/2014 2101 10/31/2014 2002 Full Code [id number]  Hower, Aaron Mose, MD Inpatient       TOTAL TIME TAKING CARE OF THIS PATIENT: 45 minutes.    Vaughan Basta M.D on 10/08/2018   Between 7am to 6pm - Pager - [phone]  After 6pm go to [email or web address] - password [email or web address] [facility]  Office  [phone]  CC: Primary care physician; System, Pcp Not In   Note: This dictation was prepared with  Dragon dictation along with smaller phrase technology. Any transcriptional errors that result from this process are unintentional.

## 2018-10-08 NOTE — ED Provider Notes (Signed)
[facility] Emergency Department Provider Note       Time seen: ----------------------------------------- 4:20 PM on 10/08/2018 -----------------------------------------   I have reviewed the triage vital signs and the nursing notes.  HISTORY   Chief Complaint Shortness of Breath    HPI Todd Brown is a 58 y.o. male with a history of anemia, diabetes, dialysis, DVT, and hyperlipidemia, schizophrenia who presents to the ED for shortness of breath.  Patient states he went to dialysis center yesterday but did not complete his treatment.  He went to dialysis today without an appointment feeling short of breath but found that they were already closed.  He presents here for further evaluation.  He denies any fevers, chills, cough, vomiting or diarrhea.  Past Medical History:  Diagnosis Date  . Anemia   . Diabetes mellitus without complication ([place])   . Dialysis patient [facility])    Mon. -Wed.- Fri  . DVT (deep venous thrombosis) ([place])    cephalic and basolic vein thrombosis  . ESRD (end stage renal disease) ([place])   . Hyperlipidemia   . Malignant hypertension   . Renal artery stenosis ([place])   . Schizophrenia [facility])     Patient Active Problem List   Diagnosis Date Noted  . Acute pulmonary edema ([place]) 09/12/2018  . Scrotal edema 05/10/2017  . Symptomatic anemia 04/30/2017  . Anticoagulated on Coumadin 09/14/2016  . Cardiac arrest ([place]) 08/03/2016  . Sepsis ([place]) 05/26/2016  . Dialysis patient ([place]) 04/15/2016  . End stage renal disease ([place]) 02/12/2016  . Hyperkalemia 05/20/2015  . Hepatitis C 09/26/2013  . Schizophrenia ([place]) 03/23/2013  . Essential hypertension, benign 03/23/2013  . Chronic kidney disease 03/23/2013    Past Surgical History:  Procedure Laterality Date  . A/V FISTULAGRAM N/A 08/06/2016   Procedure: A/V Fistulagram;  Surgeon: Algernon Huxley, MD;  Location: [place] [facility];  Service: Cardiovascular;  Laterality: N/A;  . A/V SHUNT  INTERVENTION N/A 08/06/2016   Procedure: A/V Shunt Intervention;  Surgeon: Algernon Huxley, MD;  Location: [place] [facility];  Service: Cardiovascular;  Laterality: N/A;  . AV FISTULA PLACEMENT Left 12/26/2014   Procedure: ARTERIOVENOUS (AV) FISTULA CREATION;  Surgeon: Algernon Huxley, MD;  Location: [facility] ORS;  Service: Vascular;  Laterality: Left;  . ESOPHAGOGASTRODUODENOSCOPY (EGD) WITH PROPOFOL N/A 05/06/2017   Procedure: ESOPHAGOGASTRODUODENOSCOPY (EGD) WITH PROPOFOL;  Surgeon: Jonathon Bellows, MD;  Location: [facility] ENDOSCOPY;  Service: Gastroenterology;  Laterality: N/A;  . INSERTION OF DIALYSIS CATHETER Right   . PERIPHERAL VASCULAR CATHETERIZATION N/A 10/22/2014   Procedure: Dialysis/Perma Catheter Insertion;  Surgeon: Algernon Huxley, MD;  Location: [place] [facility] LAB;  Service: Cardiovascular;  Laterality: N/A;  . PERIPHERAL VASCULAR CATHETERIZATION N/A 02/28/2015   Procedure: Dialysis/Perma Catheter Removal;  Surgeon: Algernon Huxley, MD;  Location: [place] [facility] LAB;  Service: Cardiovascular;  Laterality: N/A;  . Repair fx left lower leg     yrs ago (age 10)    Allergies Thorazine [chlorpromazine]  Social History Social History   Tobacco Use  . Smoking status: Current Every Day Smoker    Packs/day: 0.50    Types: Cigarettes  . Smokeless tobacco: Never Used  . Tobacco comment: 10 cigars a day  Substance Use Topics  . Alcohol use: No  . Drug use: No   Review of Systems Constitutional: Negative for fever. Cardiovascular: Negative for chest pain. Respiratory: Positive for shortness of breath Gastrointestinal: Negative for abdominal pain, vomiting and diarrhea. Musculoskeletal: Negative for back pain. Skin:  Negative for rash. Neurological: Negative for headaches, focal weakness or numbness.  All systems negative/normal/unremarkable except as stated in the HPI  ____________________________________________   PHYSICAL EXAM:  VITAL SIGNS: ED Triage Vitals  Enc Vitals Group     BP  10/08/18 1617 (!) 206/106     Pulse Rate 10/08/18 1617 81     Resp 10/08/18 1617 16     Temp 10/08/18 1617 98.3 F (36.8 C)     Temp Source 10/08/18 1617 Oral     SpO2 10/08/18 1617 95 %     Weight 10/08/18 1618 170 lb (77.1 kg)     Height 10/08/18 1618 6\' 3"  (1.905 m)     Head Circumference --      Peak Flow --      Pain Score 10/08/18 1618 0     Pain Loc --      Pain Edu? --      Excl. in [place]? --    Constitutional: Alert and oriented, agitated, no distress at this time ENT      Head: Normocephalic and atraumatic.      Nose: No congestion/rhinnorhea.      Mouth/Throat: Mucous membranes are moist.      Neck: No stridor. Cardiovascular: Normal rate, regular rhythm. No murmurs, rubs, or gallops. Respiratory: Normal respiratory effort without tachypnea nor retractions. Breath sounds are clear and equal bilaterally. No wheezes/rales/rhonchi. Gastrointestinal: Soft and nontender. Normal bowel sounds Musculoskeletal: Nontender with normal range of motion in extremities.  Mild to moderate edema is noted Neurologic: Stuttering speech, no gross focal neurologic deficits are appreciated.  Skin:  Skin is warm, dry and intact. No rash noted. Psychiatric: Mood and affect are normal.  Bizarre behavior at times ____________________________________________  EKG: Interpreted by me.  Sinus rhythm rate of 75 bpm, normal PR interval, borderline wide QRS, normal QT, hyperacute T waves  ____________________________________________  ED COURSE:  As part of my medical decision making, I reviewed the following data within the [place] History obtained from family if available, nursing notes, old chart and ekg, as well as notes from prior ED visits. Patient presented for shortness of breath, we will assess with labs and imaging as indicated at this time.   Procedures  [name] was evaluated in Emergency Department on 10/08/2018 for the symptoms described in the history of  present illness. He was evaluated in the context of the global COVID-19 pandemic, which necessitated consideration that the patient might be at risk for infection with the SARS-CoV-2 virus that causes COVID-19. Institutional protocols and algorithms that pertain to the evaluation of patients at risk for COVID-19 are in a state of rapid change based on information released by regulatory bodies including the CDC and federal and state organizations. These policies and algorithms were followed during the patient's care in the ED.  ____________________________________________   LABS (pertinent positives/negatives)  Labs Reviewed  CBC WITH DIFFERENTIAL/PLATELET - Abnormal; Notable for the following components:      Result Value   RBC 2.94 (*)    Hemoglobin 10.0 (*)    HCT 30.1 (*)    MCV 102.4 (*)    All other components within normal limits  COMPREHENSIVE METABOLIC PANEL - Abnormal; Notable for the following components:   Potassium 6.1 (*)    Glucose, Bld 104 (*)    BUN 44 (*)    Creatinine, Ser 8.18 (*)    GFR calc non Af Amer 7 (*)    GFR calc Af Wyvonnia Lora  8 (*)    All other components within normal limits  SARS CORONAVIRUS 2 (HOSPITAL ORDER, [facility])  TROPONIN I    RADIOLOGY  Chest x-ray IMPRESSION: Mild vascular congestion but improved from the prior exam. ____________________________________________   DIFFERENTIAL DIAGNOSIS   End-stage renal disease on dialysis, electrolyte abnormality, pulmonary edema  FINAL ASSESSMENT AND PLAN  End-stage renal disease on dialysis, hyperkalemia, dyspnea   Plan: The patient had presented for shortness of breath having not fully completed dialysis yesterday. Patient's labs did reveal some hyperkalemia with a potassium of 6.1, BUN and creatinine are as expected. Patient's imaging revealed vascular congestion but no acute process.  I have discussed with nephrology who recommends admission and dialysis.   Laurence Aly, MD    Note: This note was generated in part or whole with voice recognition software. Voice recognition is usually quite accurate but there are transcription errors that can and very often do occur. I apologize for any typographical errors that were not detected and corrected.     Earleen Newport, MD 10/08/18 [phone]

## 2018-10-08 NOTE — Progress Notes (Signed)
Family Meeting Note  Advance Directive:yes  Today a meeting took place with the Patient.   The following clinical team members were present during this meeting:MD  The following were discussed:Patient's diagnosis: End-stage renal disease on hemodialysis, hypertension, Patient's progosis: Unable to determine and Goals for treatment: Full Code  Additional follow-up to be provided: Nephrology  Time spent during discussion:20 minutes  Vaughan Basta, MD

## 2018-10-08 NOTE — ED Notes (Signed)
Report given to dialysis RN. Pt ready for dialysis.

## 2018-10-08 NOTE — Progress Notes (Signed)
[facility]  ROUNDING NOTE   Subjective:   [name] presents to ED on 5/9 for shortness of breath, wheezing, cough and hyperkalemia. Patient's last hemodialysis treatment was Friday, 5/8 but he only received 3 hours out of his 4.5 hours.   Patient with hypertensive urgency.   Objective:  Vital signs in last 24 hours:  Temp:  [98.3 F (36.8 C)] 98.3 F (36.8 C) (05/09 1617) Pulse Rate:  [73-81] 73 (05/09 1630) Resp:  [16] 16 (05/09 1630) BP: (204-206)/(91-106) 204/91 (05/09 1630) SpO2:  [95 %-98 %] 98 % (05/09 1630) Weight:  [77.1 kg] 77.1 kg (05/09 1618)  Weight change:  Filed Weights   10/08/18 1618  Weight: 77.1 kg    Intake/Output: No intake/output data recorded.   Intake/Output this shift:  No intake/output data recorded.  Physical Exam: General: In respiratory distress  Head: Normocephalic, atraumatic. Moist oral mucosal membranes  Eyes: Anicteric, PERRL  Neck: Supple, trachea midline  Lungs:  Bilateral crackles and wheezes  Heart: Regular rate and rhythm  Abdomen:  Soft, nontender,   Extremities: + peripheral edema.  Neurologic: Nonfocal, moving all four extremities  Skin: No lesions  Access: Left AVF    Basic Metabolic Panel: Recent Labs  Lab 10/08/18 1631  NA 140  K 6.1*  CL 101  CO2 29  GLUCOSE 104*  BUN 44*  CREATININE 8.18*  CALCIUM 9.3    Liver Function Tests: Recent Labs  Lab 10/08/18 1631  AST 25  ALT 24  ALKPHOS 123  BILITOT 0.6  PROT 7.5  ALBUMIN 3.7   No results for input(s): LIPASE, AMYLASE in the last 168 hours. No results for input(s): AMMONIA in the last 168 hours.  CBC: Recent Labs  Lab 10/08/18 1631  WBC 6.1  NEUTROABS 4.1  HGB 10.0*  HCT 30.1*  MCV 102.4*  PLT 208    Cardiac Enzymes: Recent Labs  Lab 10/08/18 1631  TROPONINI <0.03    BNP: Invalid input(s): POCBNP  CBG: No results for input(s): GLUCAP in the last 168 hours.  Microbiology: Results for orders placed or  performed during the hospital encounter of 09/11/18  MRSA PCR Screening     Status: None   Collection Time: 09/12/18 11:33 AM  Result Value Ref Range Status   MRSA by PCR NEGATIVE NEGATIVE Final    Comment:        The GeneXpert MRSA Assay (FDA approved for NASAL specimens only), is one component of a comprehensive MRSA colonization surveillance program. It is not intended to diagnose MRSA infection nor to guide or monitor treatment for MRSA infections. Performed at [facility], [address] [place], [place] [address]     Coagulation Studies: No results for input(s): LABPROT, INR in the last 72 hours.  Urinalysis: No results for input(s): COLORURINE, LABSPEC, PHURINE, GLUCOSEU, HGBUR, BILIRUBINUR, KETONESUR, PROTEINUR, UROBILINOGEN, NITRITE, LEUKOCYTESUR in the last 72 hours.  Invalid input(s): APPERANCEUR    Imaging: Dg Chest 1 View  Result Date: 10/08/2018 CLINICAL DATA:  Shortness of Breath EXAM: CHEST  1 VIEW COMPARISON:  09/11/2018 FINDINGS: Cardiac shadow is enlarged stable. Improvement in the degree of vascular congestion is noted when compared with the prior exam. No focal infiltrate is seen. Calcified granuloma is again lung. No bony abnormality is noted. IMPRESSION: Mild vascular congestion but improved from the prior exam. Electronically Signed   By: Inez Catalina M.D.   On: 10/08/2018 16:41     Medications:       Assessment/  Plan:  [name] is a 58 y.o. black male with end stage renal disease on hemodialysis, hypertension, schizophrenia, hepatitis C, history of DVT, admitted to [facility] for hyperkalemia, pulmonary edema and hypertensive urgency  [facility] MWF [place]. Left AVF 75kg   1. End Stage Renal Disease with hyperkalemia: did not complete his outpatient hemodialysis treatment yesterday. Now with pulmonary edema and hypertensive urgency.  - Emergent hemodialysis for tonight. Orders prepared.   2. Hypertension: urgency on  admission. Home regimen: amlodipine, clonidine, hydralazine, minoxidil - UF with hemodialysis treatment.   3. Anemia of chronic kidney disease: hemoglobin 10.  - EPO with MWF hemodialysis treatments.   4. Secondary Hyperparathyroidism: outpatient labs from 4/24: phos 4, PTH 518 - Calcium acetate with meals.    LOS: 0 [name] 5/9/20205:38 PM

## 2018-10-08 NOTE — Progress Notes (Signed)
Pre HD Assessment    10/08/18 1907  Neurological  Level of Consciousness Alert  Orientation Level Oriented X4  Respiratory  Respiratory Pattern Regular  Chest Assessment Chest expansion symmetrical  Bilateral Breath Sounds Coarse crackles  Cough Congested;Non-productive  Cardiac  Pulse Regular  Heart Sounds S1, S2  ECG Monitor Yes  Vascular  R Radial Pulse +2  L Radial Pulse +2  Edema Generalized  Generalized Edema +3  Urine Characteristics  Urine Color Amber  Urine Appearance Clear  Psychosocial  Psychosocial (WDL) WDL

## 2018-10-08 NOTE — ED Triage Notes (Signed)
Pt presents to ED via [facility] from dialysis center. EMS reports pt went to dialysis yesterday as scheduled but did not complete treatment. Pt went to dialysis today without appointment d/t feeling short of breath and found center already closed.

## 2018-10-08 NOTE — ED Notes (Signed)
Pt transported to dialysis and this RN to give report to floor at 7:30.

## 2018-10-08 NOTE — Progress Notes (Signed)
Pre HD [facility]    10/08/18 1830  Hand-Off documentation  Report given to (Full Name) Beatris Ship, RN   Report received from (Full Name) Sandi Mealy, RN   Vital Signs  Temp 98.3 F (36.8 C)  Temp Source Oral  Pulse Rate 62  Pulse Rate Source Monitor  Resp 19  BP (!) 191/96  BP Location Right Arm  BP Method Automatic  Patient Position (if appropriate) Sitting  Oxygen Therapy  SpO2 100 %  O2 Device Nasal Cannula  O2 Flow Rate (L/min) 2 L/min  Pulse Oximetry Type Continuous  Pain Assessment  Pain Scale 0-10  Pain Score 0  Dialysis Weight  Weight 77.1 kg  Type of Weight Pre-Dialysis  Time-Out for Hemodialysis  What Procedure? HD  Pt Identifiers(min of two) First/Last Name  Correct Site? Yes  Correct Side? Yes  Correct Procedure? Yes  Consents Verified? Yes  Rad Studies Available? N/A  Safety Precautions Reviewed? Yes  Engineer, civil (consulting) Number 5  Station Number 1  UF/Alarm Test Passed  Conductivity: Meter 14  Conductivity: Machine  14.1  pH 7.4  Reverse Osmosis Main  Normal Saline Lot Number [id number]  Dialyzer Lot Number [id number]  Disposable Set Lot Number [id number]  Machine Temperature 98.6 F (37 C)  Musician and Audible Yes  Blood Lines Intact and Secured Yes  Pre Treatment Patient Checks  Vascular access used during treatment Fistula  Patient is receiving dialysis in a chair Yes  Hepatitis B Surface Antigen Results  (unknown)  Isolation Initiated Yes  Hepatitis B Surface Antibody  (unknown)  Hemodialysis Consent Verified Yes  Hemodialysis Standing Orders Initiated Yes  ECG (Telemetry) Monitor On Yes  Prime Ordered Normal Saline  Length of  DialysisTreatment -hour(s) 3 Hour(s)  Dialysis Treatment Comments Na 140  Dialyzer Elisio 17H NR  Dialysate 2K, 2.5 Ca  Dialysis Anticoagulant None  Dialysate Flow Ordered 600  Blood Flow Rate Ordered 400 mL/min  Ultrafiltration Goal 3 Liters  Dialysis Blood Pressure Support Ordered Normal Saline   Education / Care Plan  Dialysis Education Provided Yes  Documented Education in Care Plan Yes

## 2018-10-09 LAB — BASIC METABOLIC PANEL
Anion gap: 11 (ref 5–15)
BUN: 30 mg/dL — ABNORMAL HIGH (ref 6–20)
CO2: 30 mmol/L (ref 22–32)
Calcium: 9.2 mg/dL (ref 8.9–10.3)
Chloride: 101 mmol/L (ref 98–111)
Creatinine, Ser: 6.08 mg/dL — ABNORMAL HIGH (ref 0.61–1.24)
GFR calc Af Amer: 11 mL/min — ABNORMAL LOW (ref >60)
GFR calc non Af Amer: 9 mL/min — ABNORMAL LOW (ref >60)
Glucose, Bld: 82 mg/dL (ref 70–99)
Potassium: 5 mmol/L (ref 3.5–5.1)
Sodium: 142 mmol/L (ref 135–145)

## 2018-10-09 LAB — CBC
HCT: 29.8 % — ABNORMAL LOW (ref 39.0–52.0)
Hemoglobin: 10.1 g/dL — ABNORMAL LOW (ref 13.0–17.0)
MCH: 34.2 pg — ABNORMAL HIGH (ref 26.0–34.0)
MCHC: 33.9 g/dL (ref 30.0–36.0)
MCV: 101 fL — ABNORMAL HIGH (ref 80.0–100.0)
Platelets: 206 10*3/uL (ref 150–400)
RBC: 2.95 MIL/uL — ABNORMAL LOW (ref 4.22–5.81)
RDW: 14 % (ref 11.5–15.5)
WBC: 5 10*3/uL (ref 4.0–10.5)
nRBC: 0 % (ref 0.0–0.2)

## 2018-10-09 NOTE — Progress Notes (Signed)
Post HD Tx    10/08/18 2220  Hand-Off documentation  Report given to (Full Name) Dyke Maes  Report received from (Full Name) Beatris Ship, RN   Vital Signs  Temp 98.3 F (36.8 C)  Temp Source Oral  Pulse Rate 66  Pulse Rate Source Monitor  Resp 15  BP (!) 160/80  BP Location Right Arm  BP Method Automatic  Patient Position (if appropriate) Sitting  Oxygen Therapy  SpO2 100 %  O2 Device Nasal Cannula  O2 Flow Rate (L/min) 2 L/min  Pain Assessment  Pain Scale 0-10  Pain Score 0  Dialysis Weight  Weight 72.6 kg  Type of Weight Post-Dialysis  Post-Hemodialysis Assessment  Rinseback Volume (mL) 250 mL  KECN 69.5 V  Duration of HD Treatment -hour(s) 3 hour(s)  Hemodialysis Intake (mL) 500 mL  UF Total -Machine (mL) 4000 mL  Net UF (mL) 3500 mL  Tolerated HD Treatment Yes  AVG/AVF Arterial Site Held (minutes) 10 minutes  AVG/AVF Venous Site Held (minutes) 5 minutes  Fistula / Graft Left Upper arm Arteriovenous fistula  No Placement Date or Time found.   Orientation: Left  Access Location: Upper arm  Access Type: Arteriovenous fistula  Site Condition No complications  Fistula / Graft Assessment Present;Thrill;Bruit;Aneurism present  Status Patent  Drainage Description None

## 2018-10-09 NOTE — Progress Notes (Signed)
HD Tx completed    10/08/18 2215  Vital Signs  Pulse Rate 68  Pulse Rate Source Monitor  Resp 14  BP (!) 160/80  BP Location Right Arm  BP Method Automatic  Patient Position (if appropriate) Sitting  Oxygen Therapy  SpO2 100 %  O2 Device Nasal Cannula  O2 Flow Rate (L/min) 2 L/min  During Hemodialysis Assessment  HD Safety Checks Performed Yes  KECN 69.5 KECN  Dialysis Fluid Bolus Normal Saline  Bolus Amount (mL) 250 mL  Intra-Hemodialysis Comments Tx completed  Fistula / Graft Left Upper arm Arteriovenous fistula  No Placement Date or Time found.   Orientation: Left  Access Location: Upper arm  Access Type: Arteriovenous fistula  Status Deaccessed  Drainage Description None

## 2018-10-09 NOTE — Progress Notes (Signed)
Patient sent out via wheelchair to his waiting ride. Patient usually wear oxygen at home and insisted that he was not going to wear it on his way home.  He said he would be fine

## 2018-10-09 NOTE — Discharge Summary (Signed)
[facility] [place] NAME: Todd Brown    MR#:  [id number]  DATE OF BIRTH:  Jul 02, 1960  DATE OF ADMISSION:  10/08/2018 ADMITTING PHYSICIAN: Vaughan Basta, MD  DATE OF DISCHARGE: 10/09/2018  PRIMARY CARE PHYSICIAN: System, Pcp Not In    ADMISSION DIAGNOSIS:  Hyperkalemia [E87.5] End stage renal disease on dialysis ([place]) [N18.6, Z99.2] Dyspnea, unspecified type [R06.00]  DISCHARGE DIAGNOSIS:  Active Problems:   Hyperkalemia   End stage renal disease (HCC)   Fluid overload   SECONDARY DIAGNOSIS:   Past Medical History:  Diagnosis Date  . Anemia   . Diabetes mellitus without complication ([place])   . Dialysis patient [facility])    Mon. -Wed.- Fri  . 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 ESRD (end stage renal disease) ([place])   . Hyperlipidemia   . Malignant hypertension   . Renal artery stenosis ([place])   . Schizophrenia [facility])     HOSPITAL COURSE:    58 year old male with end-stage renal disease on hemodialysis and schizophrenia who presented to the emergency room due to shortness of breath.   1.  Fluid overload with acute respiratory failure due to missing dialysis: Patient was emergently dialyzed. Fluid status has improved.  2.  Hyperkalemia without any EKG changes: This improved after dialysis  3.  Essential hypertension: Continue minoxidil, hydralazine, clonidine and Norvasc  4.  Schizophrenia: Continue outpatient regimen  5.  End-stage renal disease on hemodialysis: Patient will continue with outpatient regimen of dialysis.  DISCHARGE CONDITIONS AND DIET:   Stable for discharge on renal diet  CONSULTS OBTAINED:  Treatment Team:  Lavonia Dana, MD  DRUG ALLERGIES:   Allergies  Allergen Reactions  . Thorazine [Chlorpromazine] Other (See Comments)    Reaction:  Unknown , pt states it makes him feel real bad    DISCHARGE MEDICATIONS:   Allergies as of 10/09/2018       Reactions   Thorazine [chlorpromazine] Other (See Comments)   Reaction:  Unknown , pt states it makes him feel real bad      Medication List    TAKE these medications   acetaminophen 500 MG tablet Commonly known as:  TYLENOL Take 500 mg by mouth every 4 (four) hours as needed for mild pain or moderate pain.   amLODipine 10 MG tablet Commonly known as:  NORVASC Take 1 tablet (10 mg total) by mouth daily.   calcium acetate 667 MG capsule Commonly known as:  PHOSLO Take 2 capsules (1,334 mg total) by mouth 3 (three) times daily with meals.   carbamazepine 200 MG 12 hr tablet Commonly known as:  TEGRETOL XR Take 1,000 mg by mouth every morning.   celecoxib 200 MG capsule Commonly known as:  CELEBREX Take 200 mg by mouth daily as needed for mild pain.   cholecalciferol 1000 units tablet Commonly known as:  VITAMIN D Take 1,000 Units by mouth daily.   cloNIDine 0.3 MG tablet Commonly known as:  CATAPRES Take 0.3 mg by mouth 3 (three) times daily.   ferrous sulfate 325 (65 FE) MG tablet Take 1 tablet (325 mg total) by mouth daily.   fluPHENAZine 2.5 MG tablet Commonly known as:  PROLIXIN Take 2.5 mg by mouth 2 (two) times daily.   Fluticasone-Salmeterol 500-50 MCG/DOSE Aepb Commonly known as:  ADVAIR Inhale 1 puff into the lungs 2 (two) times daily.   hydrALAZINE 100 MG tablet Commonly known as:  APRESOLINE Take  1 tablet (100 mg total) by mouth 2 (two) times daily.   minoxidil 10 MG tablet Commonly known as:  LONITEN Take 10 mg by mouth 2 (two) times daily.   multivitamin Tabs tablet Take 1 tablet by mouth daily.   omeprazole 40 MG capsule Commonly known as:  PRILOSEC Take 1 capsule (40 mg total) by mouth daily.   ProAir HFA 108 (90 Base) MCG/ACT inhaler Generic drug:  albuterol Inhale 1-2 puffs into the lungs every 4 (four) hours as needed for wheezing or shortness of breath.   trihexyphenidyl 2 MG tablet Commonly known as:  ARTANE Take 2 mg by mouth  2 (two) times daily.         Today   CHIEF COMPLAINT:   Patient denies shortness of breath or chest pain   VITAL SIGNS:  Blood pressure 133/67, pulse 66, temperature 98.2 F (36.8 C), temperature source Oral, resp. rate 17, height 6\' 3"  (1.905 m), weight 72.6 kg, SpO2 99 %.   REVIEW OF SYSTEMS:  Review of Systems  Constitutional: Negative.  Negative for chills, fever and malaise/fatigue.  HENT: Negative.  Negative for ear discharge, ear pain, hearing loss, nosebleeds and sore throat.   Eyes: Negative.  Negative for blurred vision and pain.  Respiratory: Negative.  Negative for cough, hemoptysis, shortness of breath and wheezing.   Cardiovascular: Negative.  Negative for chest pain, palpitations and leg swelling.  Gastrointestinal: Negative.  Negative for abdominal pain, blood in stool, diarrhea, nausea and vomiting.  Genitourinary: Negative.  Negative for dysuria.  Musculoskeletal: Negative.  Negative for back pain.  Skin: Negative.   Neurological: Negative for dizziness, tremors, speech change, focal weakness, seizures and headaches.  Endo/Heme/Allergies: Negative.  Does not bruise/bleed easily.  Psychiatric/Behavioral: Negative.  Negative for depression, hallucinations and suicidal ideas.     PHYSICAL EXAMINATION:  GENERAL:  58 y.o.-year-old patient lying in the bed with no acute distress.  NECK:  Supple, no jugular venous distention. No thyroid enlargement, no tenderness.  LUNGS: Normal breath sounds bilaterally, no wheezing, rales,rhonchi  No use of accessory muscles of respiration.  CARDIOVASCULAR: S1, S2 normal. No murmurs, rubs, or gallops.  ABDOMEN: Soft, non-tender, non-distended. Bowel sounds present. No organomegaly or mass.  EXTREMITIES: No pedal edema, cyanosis, or clubbing.  PSYCHIATRIC: The patient is alert and oriented x 3.  SKIN: No obvious rash, lesion, or ulcer.   DATA REVIEW:   CBC Recent Labs  Lab 10/09/18 0541  WBC 5.0  HGB 10.1*  HCT 29.8*   PLT 206    Chemistries  Recent Labs  Lab 10/08/18 1631 10/09/18 0541  NA 140 142  K 6.1* 5.0  CL 101 101  CO2 29 30  GLUCOSE 104* 82  BUN 44* 30*  CREATININE 8.18* 6.08*  CALCIUM 9.3 9.2  AST 25  --   ALT 24  --   ALKPHOS 123  --   BILITOT 0.6  --     Cardiac Enzymes Recent Labs  Lab 10/08/18 1631  TROPONINI <0.03    Microbiology Results  @[email or web address] @  RADIOLOGY:  Dg Chest 1 View  Result Date: 10/08/2018 CLINICAL DATA:  Shortness of Breath EXAM: CHEST  1 VIEW COMPARISON:  09/11/2018 FINDINGS: Cardiac shadow is enlarged stable. Improvement in the degree of vascular congestion is noted when compared with the prior exam. No focal infiltrate is seen. Calcified granuloma is again lung. No bony abnormality is noted. IMPRESSION: Mild vascular congestion but improved from the prior exam. Electronically Signed   By: Inez Catalina  M.D.   On: 10/08/2018 16:41      Allergies as of 10/09/2018      Reactions   Thorazine [chlorpromazine] Other (See Comments)   Reaction:  Unknown , pt states it makes him feel real bad      Medication List    TAKE these medications   acetaminophen 500 MG tablet Commonly known as:  TYLENOL Take 500 mg by mouth every 4 (four) hours as needed for mild pain or moderate pain.   amLODipine 10 MG tablet Commonly known as:  NORVASC Take 1 tablet (10 mg total) by mouth daily.   calcium acetate 667 MG capsule Commonly known as:  PHOSLO Take 2 capsules (1,334 mg total) by mouth 3 (three) times daily with meals.   carbamazepine 200 MG 12 hr tablet Commonly known as:  TEGRETOL XR Take 1,000 mg by mouth every morning.   celecoxib 200 MG capsule Commonly known as:  CELEBREX Take 200 mg by mouth daily as needed for mild pain.   cholecalciferol 1000 units tablet Commonly known as:  VITAMIN D Take 1,000 Units by mouth daily.   cloNIDine 0.3 MG tablet Commonly known as:  CATAPRES Take 0.3 mg by mouth 3 (three) times daily.   ferrous sulfate  325 (65 FE) MG tablet Take 1 tablet (325 mg total) by mouth daily.   fluPHENAZine 2.5 MG tablet Commonly known as:  PROLIXIN Take 2.5 mg by mouth 2 (two) times daily.   Fluticasone-Salmeterol 500-50 MCG/DOSE Aepb Commonly known as:  ADVAIR Inhale 1 puff into the lungs 2 (two) times daily.   hydrALAZINE 100 MG tablet Commonly known as:  APRESOLINE Take 1 tablet (100 mg total) by mouth 2 (two) times daily.   minoxidil 10 MG tablet Commonly known as:  LONITEN Take 10 mg by mouth 2 (two) times daily.   multivitamin Tabs tablet Take 1 tablet by mouth daily.   omeprazole 40 MG capsule Commonly known as:  PRILOSEC Take 1 capsule (40 mg total) by mouth daily.   ProAir HFA 108 (90 Base) MCG/ACT inhaler Generic drug:  albuterol Inhale 1-2 puffs into the lungs every 4 (four) hours as needed for wheezing or shortness of breath.   trihexyphenidyl 2 MG tablet Commonly known as:  ARTANE Take 2 mg by mouth 2 (two) times daily.          Management plans discussed with the patient and he is in agreement. Stable for discharge   Patient should follow up with dr Abigail Butts  CODE STATUS:     Code Status Orders  (From admission, onward)         Start     Ordered   10/08/18 2246  Full code  Continuous     10/08/18 2245        Code Status History    Date Active Date Inactive Code Status Order ID Comments User Context   09/12/2018 0147 09/13/2018 2046 Full Code [id number]  Christel Mormon, MD ED   05/11/2017 0123 05/13/2017 1857 Full Code [id number]  Gorden Harms, MD Inpatient   04/30/2017 2216 05/07/2017 2158 Full Code [id number]  Vaughan Basta, MD Inpatient   08/03/2016 1426 08/18/2016 1547 Full Code [id number]  Wilhelmina Mcardle, MD ED   05/26/2016 1741 05/31/2016 1748 Full Code [id number]  Max Sane, MD Inpatient   05/16/2015 2343 05/20/2015 1852 Full Code [id number]  Hower, Aaron Mose, MD ED   10/20/2014 2101 10/31/2014 2002 Full Code [id number]  Hower, Aaron Mose, MD Inpatient  TOTAL TIME TAKING CARE OF THIS PATIENT: 38 minutes.    Note: This dictation was prepared with Dragon dictation along with smaller phrase technology. Any transcriptional errors that result from this process are unintentional.  Bettey Costa M.D on 10/09/2018 at 10:47 AM  Between 7am to 6pm - Pager - [phone] After 6pm go to [email or web address] - password [email or web address] [facility]  Office  [phone]  CC: Primary care physician; System, Pcp Not In

## 2018-10-09 NOTE — Progress Notes (Signed)
[facility]  ROUNDING NOTE   Subjective:   Emergent hemodialysis treatment yesterday due to hypertensive urgency and shortness of breath. Patient tolerated hemodialysis treatment well. UF of 3.5 liters.   Patient states he is feeling good and wants to be discharged.   Objective:  Vital signs in last 24 hours:  Temp:  [98.2 F (36.8 C)-98.5 F (36.9 C)] 98.2 F (36.8 C) (05/10 0301) Pulse Rate:  [58-84] 66 (05/10 0301) Resp:  [13-21] 17 (05/10 0301) BP: (131-206)/(64-107) 133/67 (05/10 0301) SpO2:  [94 %-100 %] 99 % (05/10 0301) Weight:  [72.6 kg-77.1 kg] 72.6 kg (05/09 2220)  Weight change:  Filed Weights   10/08/18 1618 10/08/18 1830 10/08/18 2220  Weight: 77.1 kg 77.1 kg 72.6 kg    Intake/Output: I/O last 3 completed shifts: In: -  Out: 3500 [Other:3500]   Intake/Output this shift:  Total I/O In: 120 [P.O.:120] Out: -   Physical Exam: General: In respiratory distress  Head: Normocephalic, atraumatic. Moist oral mucosal membranes  Eyes: Anicteric, PERRL  Neck: Supple, trachea midline  Lungs:  clear  Heart: Regular rate and rhythm  Abdomen:  Soft, nontender,   Extremities: + peripheral edema.  Neurologic: Nonfocal, moving all four extremities  Skin: No lesions  Access: Left AVF    Basic Metabolic Panel: Recent Labs  Lab 10/08/18 1631 10/09/18 0541  NA 140 142  K 6.1* 5.0  CL 101 101  CO2 29 30  GLUCOSE 104* 82  BUN 44* 30*  CREATININE 8.18* 6.08*  CALCIUM 9.3 9.2    Liver Function Tests: Recent Labs  Lab 10/08/18 1631  AST 25  ALT 24  ALKPHOS 123  BILITOT 0.6  PROT 7.5  ALBUMIN 3.7   No results for input(s): LIPASE, AMYLASE in the last 168 hours. No results for input(s): AMMONIA in the last 168 hours.  CBC: Recent Labs  Lab 10/08/18 1631 10/09/18 0541  WBC 6.1 5.0  NEUTROABS 4.1  --   HGB 10.0* 10.1*  HCT 30.1* 29.8*  MCV 102.4* 101.0*  PLT 208 206    Cardiac Enzymes: Recent Labs  Lab 10/08/18 1631   TROPONINI <0.03    BNP: Invalid input(s): POCBNP  CBG: No results for input(s): GLUCAP in the last 168 hours.  Microbiology: Results for orders placed or performed during the hospital encounter of 10/08/18  SARS Coronavirus 2 (CEPHEID - Performed in [facility] hospital lab), Hosp Order     Status: None   Collection Time: 10/08/18  4:59 PM  Result Value Ref Range Status   SARS Coronavirus 2 NEGATIVE NEGATIVE Final    Comment: (NOTE) If result is NEGATIVE SARS-CoV-2 target nucleic acids are NOT DETECTED. The SARS-CoV-2 RNA is generally detectable in upper and lower  respiratory specimens during the acute phase of infection. The lowest  concentration of SARS-CoV-2 viral copies this assay can detect is 250  copies / mL. A negative result does not preclude SARS-CoV-2 infection  and should not be used as the sole basis for treatment or other  patient management decisions.  A negative result may occur with  improper specimen collection / handling, submission of specimen other  than nasopharyngeal swab, presence of viral mutation(s) within the  areas targeted by this assay, and inadequate number of viral copies  (<250 copies / mL). A negative result must be combined with clinical  observations, patient history, and epidemiological information. If result is POSITIVE SARS-CoV-2 target nucleic acids are DETECTED. The SARS-CoV-2 RNA is generally detectable in upper and  lower  respiratory specimens dur ing the acute phase of infection.  Positive  results are indicative of active infection with SARS-CoV-2.  Clinical  correlation with patient history and other diagnostic information is  necessary to determine patient infection status.  Positive results do  not rule out bacterial infection or co-infection with other viruses. If result is PRESUMPTIVE POSTIVE SARS-CoV-2 nucleic acids MAY BE PRESENT.   A presumptive positive result was obtained on the submitted specimen  and confirmed on  repeat testing.  While 2019 novel coronavirus  (SARS-CoV-2) nucleic acids may be present in the submitted sample  additional confirmatory testing may be necessary for epidemiological  and / or clinical management purposes  to differentiate between  SARS-CoV-2 and other Sarbecovirus currently known to infect humans.  If clinically indicated additional testing with an alternate test  methodology [phone]) is advised. The SARS-CoV-2 RNA is generally  detectable in upper and lower respiratory sp ecimens during the acute  phase of infection. The expected result is Negative. Fact Sheet for Patients:  StrictlyIdeas.no Fact Sheet for Healthcare Providers: BankingDealers.co.za This test is not yet approved or cleared by the [place] FDA and has been authorized for detection and/or diagnosis of SARS-CoV-2 by FDA under an Emergency Use Authorization (EUA).  This EUA will remain in effect (meaning this test can be used) for the duration of the COVID-19 declaration under Section 564(b)(1) of the Act, 21 U.S.C. section 360bbb-3(b)(1), unless the authorization is terminated or revoked sooner. Performed at [facility], [address] [place], [place] [address]     Coagulation Studies: No results for input(s): LABPROT, INR in the last 72 hours.  Urinalysis: No results for input(s): COLORURINE, LABSPEC, PHURINE, GLUCOSEU, HGBUR, BILIRUBINUR, KETONESUR, PROTEINUR, UROBILINOGEN, NITRITE, LEUKOCYTESUR in the last 72 hours.  Invalid input(s): APPERANCEUR    Imaging: Dg Chest 1 View  Result Date: 10/08/2018 CLINICAL DATA:  Shortness of Breath EXAM: CHEST  1 VIEW COMPARISON:  09/11/2018 FINDINGS: Cardiac shadow is enlarged stable. Improvement in the degree of vascular congestion is noted when compared with the prior exam. No focal infiltrate is seen. Calcified granuloma is again lung. No bony abnormality is noted. IMPRESSION: Mild vascular  congestion but improved from the prior exam. Electronically Signed   By: Inez Catalina M.D.   On: 10/08/2018 16:41     Medications:       Assessment/ Plan:  [name] is a 58 y.o. black male with end stage renal disease on hemodialysis, hypertension, schizophrenia, hepatitis C, history of DVT, admitted to [facility] for hyperkalemia, pulmonary edema and hypertensive urgency  [facility] MWF [place] Left AVF 75kg   1. End Stage Renal Disease with hyperkalemia: did not complete his outpatient hemodialysis treatment on Friday. Has a history of cutting his treatment short. Admitted with pulmonary edema and hypertensive urgency. Underwent emergency hemodialysis treatment yesterday, 5/9. UF of 3.5 liters - Next treatment for tomorrow.   2. Hypertension: urgency on admission. Home regimen: amlodipine, clonidine, hydralazine, minoxidil - UF with hemodialysis treatment.  - Recommend restarting loop diuretics on nondialysis days.   3. Anemia of chronic kidney disease: hemoglobin 10.1 - EPO with MWF hemodialysis treatments.   4. Secondary Hyperparathyroidism: outpatient labs from 4/24: phos 4, PTH 518 - Calcium acetate with meals.    LOS: 1 Leala Bryand 5/10/20201:58 PM

## 2018-10-09 NOTE — TOC Progression Note (Addendum)
Transition of Care [facility]) - Progression Note    Patient Details  Name: Todd Brown MRN: [id number] Date of Birth: [birth year]  [facility] of [facility]) CM/SW Contact  Latanya Maudlin, RN Phone Number: 10/09/2018, 11:00 AM  Clinical Narrative:   Patient to be discharged per MD order. Orders in place for home health services. Patient has used home health in the past but refuses it at this time. Patient has no safety concerns and is able to complete all activities of daily living without issues. Has all needed DME including home O2. Has used [facility] about a year ago and tells me he knows how to set it up again if things do not go well at home.     Expected Discharge Plan: Home/Self Care Barriers to Discharge: No Barriers Identified  Expected Discharge Plan and Services Expected Discharge Plan: Home/Self Care         Expected Discharge Date: 10/09/18                         HH Arranged: Patient Refused HH           Social Determinants of Health (SDOH) Interventions    Readmission Risk Interventions Readmission Risk Prevention Plan 10/09/2018  Transportation Screening Complete  PCP or Specialist Appt within 5-7 Days Complete  Home Care Screening Complete  Medication Review (RN CM) Complete  Some recent data might be hidden

## 2018-10-09 NOTE — Progress Notes (Signed)
Post HD Assessment    10/08/18 2220  Neurological  Level of Consciousness Alert  Orientation Level Oriented X4  Respiratory  Respiratory Pattern Regular  Chest Assessment Chest expansion symmetrical  Bilateral Breath Sounds Diminished  Cough None  Cardiac  Pulse Regular  Heart Sounds S1, S2  ECG Monitor Yes  Cardiac Rhythm NSR  Vascular  R Radial Pulse +2  L Radial Pulse +2  Edema Generalized  Generalized Edema +2  Urine Characteristics  Urine Color Amber  Urine Appearance Clear  Psychosocial  Psychosocial (WDL) WDL

## 2018-12-04 IMAGING — CR DG CHEST 2V
1 series · 2 of 2 positions shown · non-contrast
Comparison: 08/05/2016 and earlier.

CLINICAL DATA: 55-year-old male status post cardiac arrest and
resuscitation on 08/03/2016. End-stage renal disease.

EXAM:
CHEST  2 VIEW

[Series 1: dg chest 2 view · 0.14mm/px · 2 of 2 slices shown]
[im 1/2]
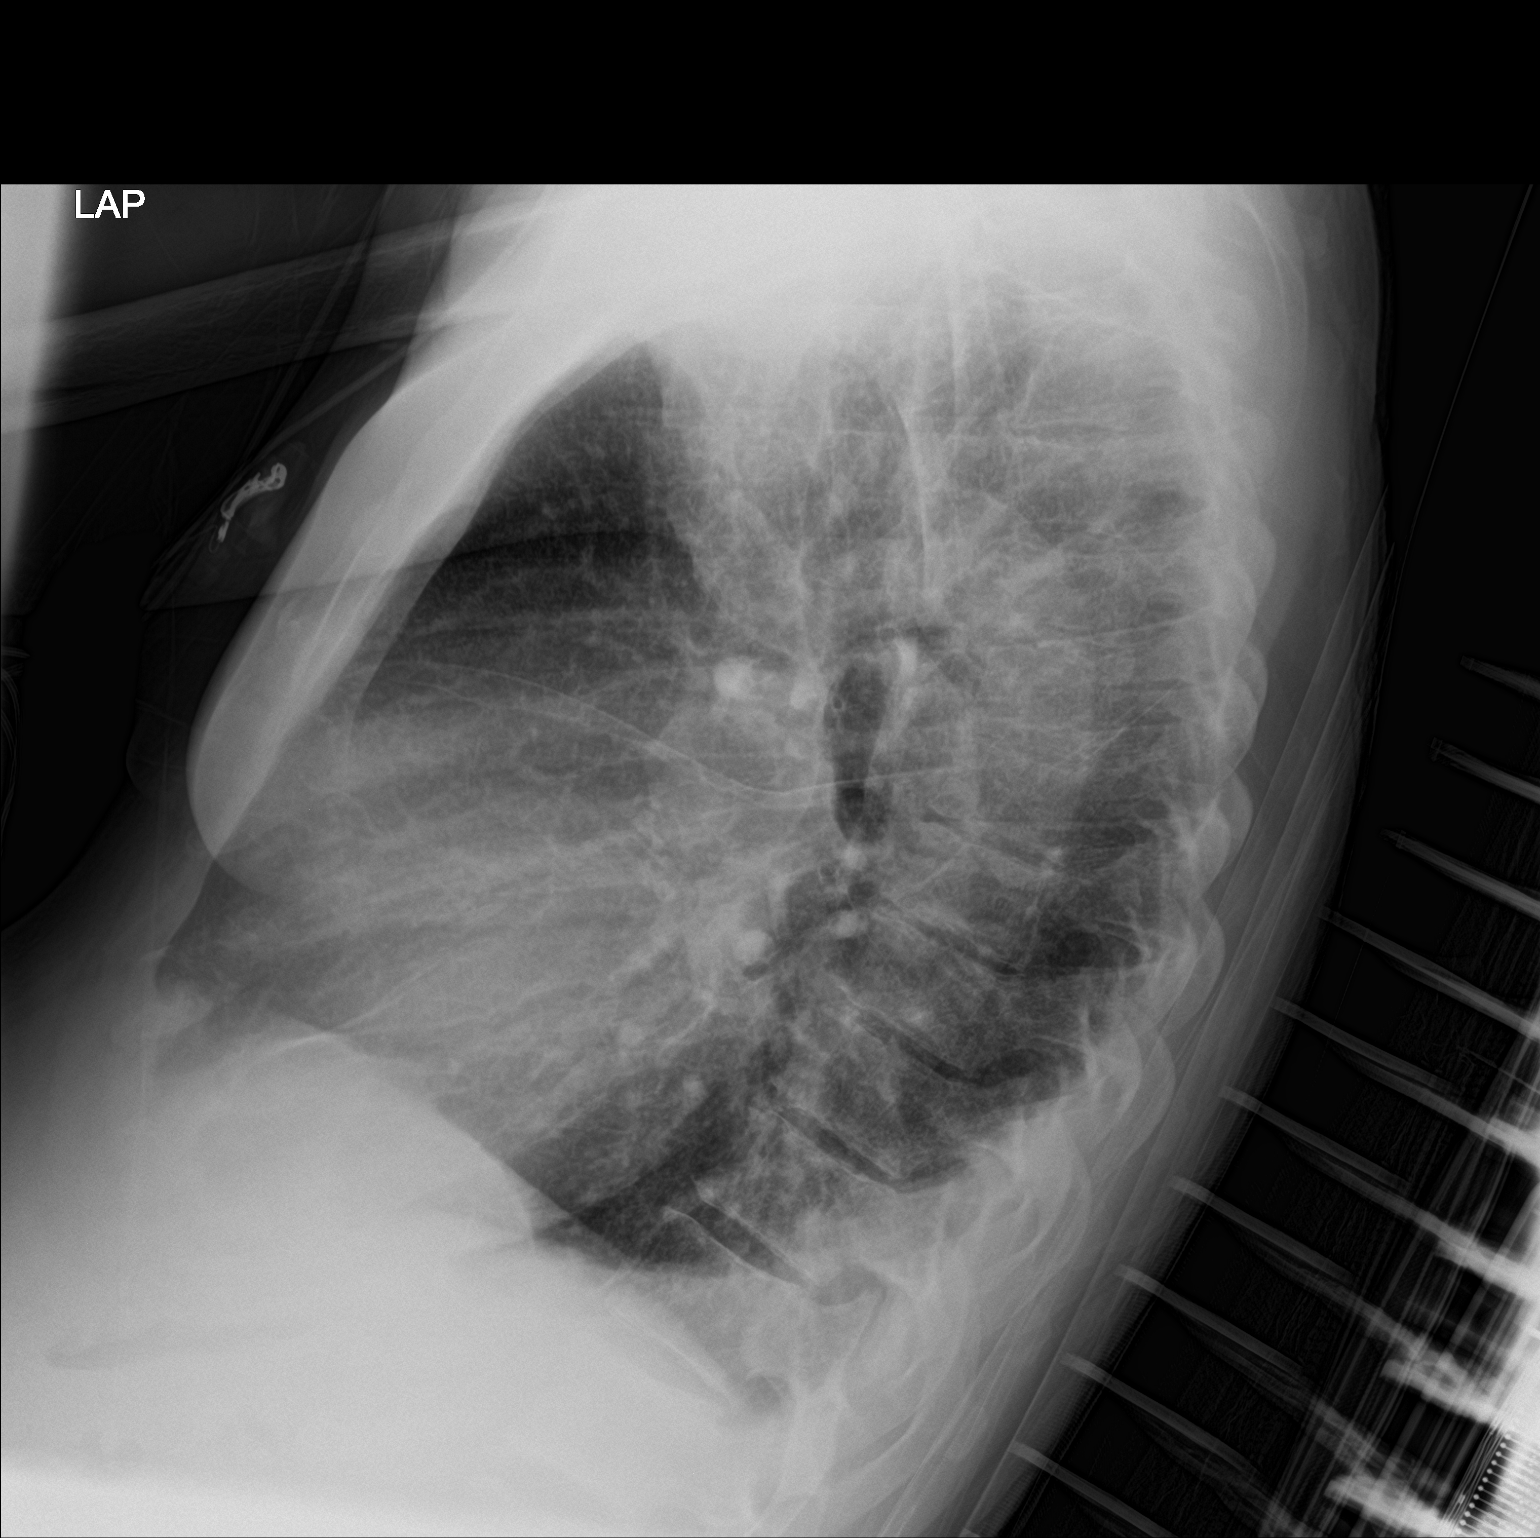
[im 2/2]
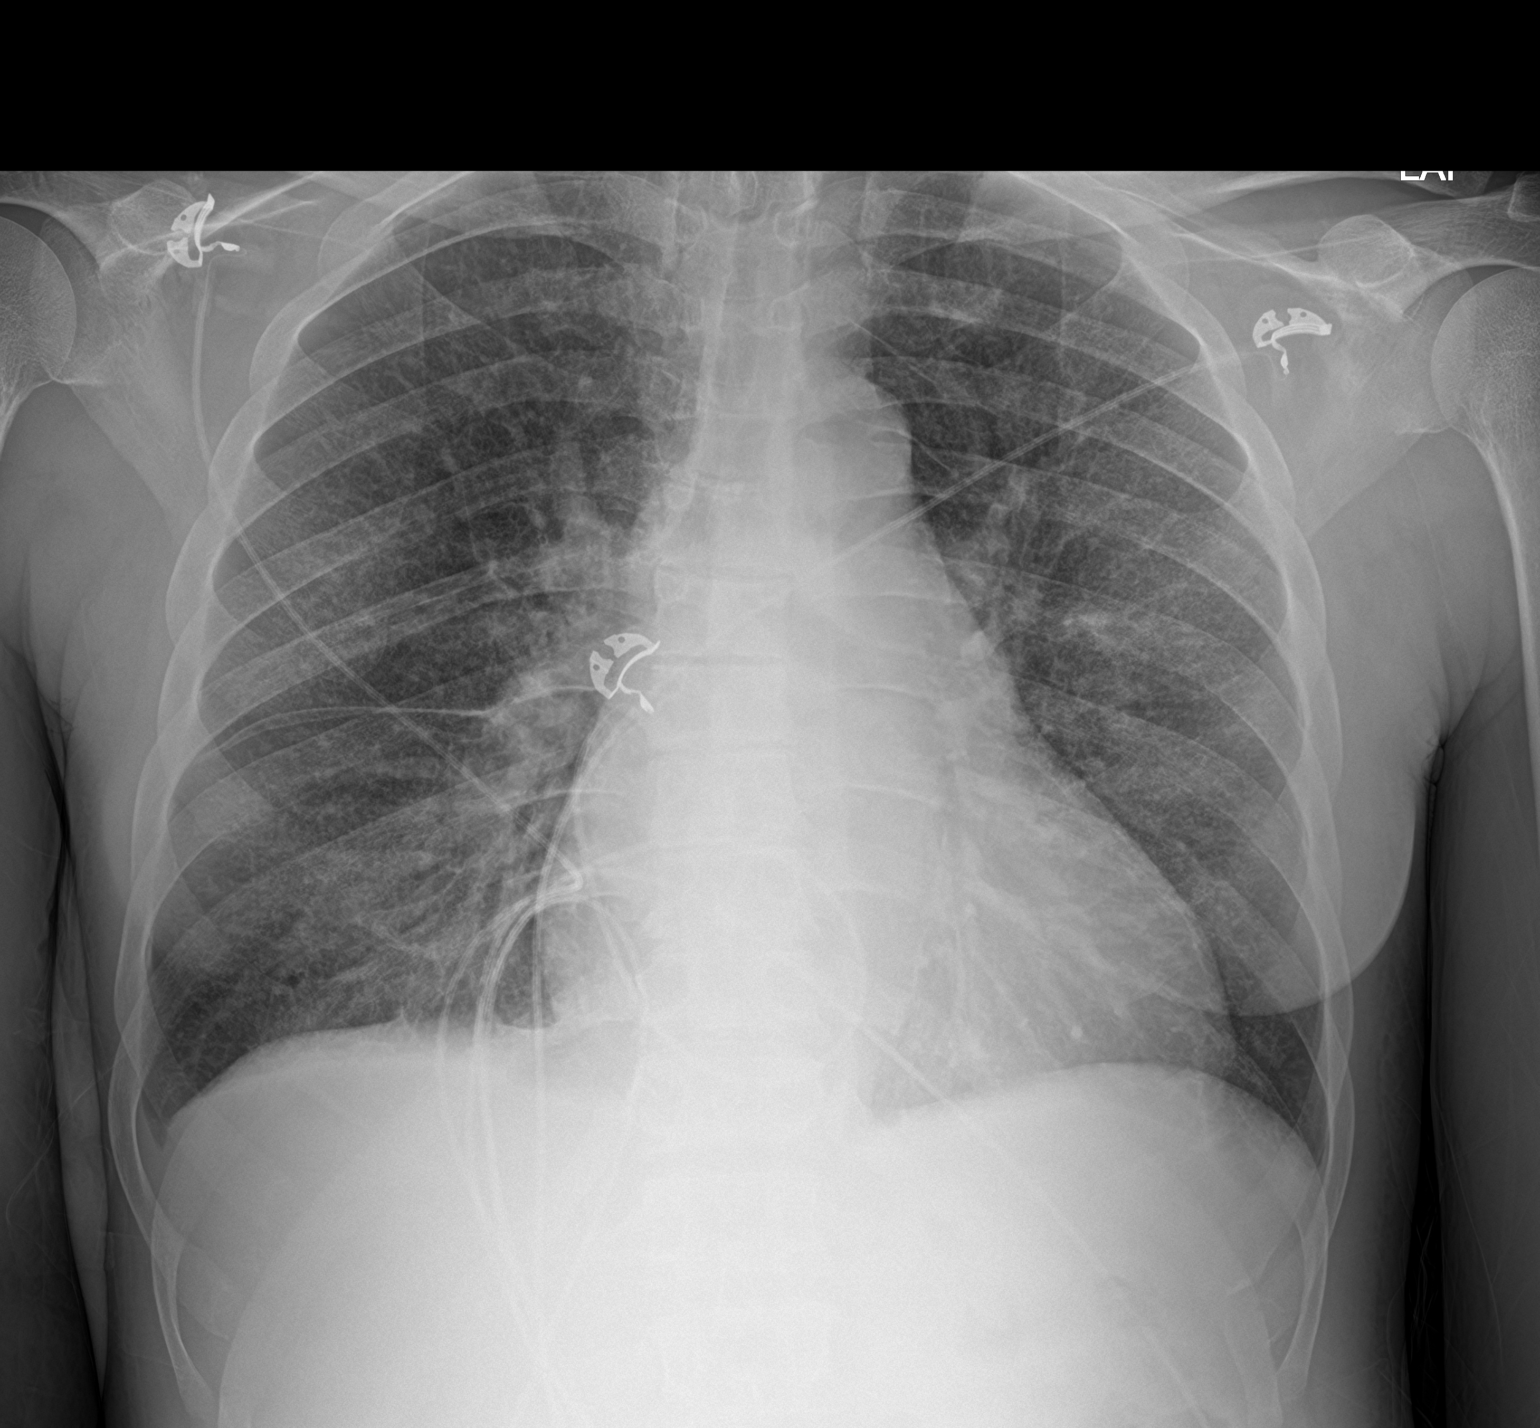

[2 of 2 positions shown; findings below may reference images not displayed]

FINDINGS: Seated AP and lateral views of the chest. Improved lung volumes and
regressed streaky in confluent basilar and infrahilar opacity. Small
pleural effusions. Chronic increased pulmonary interstitial markings
suspected and appear not significantly changed since 05/30/2016.
Stable cardiomegaly and mediastinal contours. Visualized tracheal
air column is within normal limits. No pneumothorax. No areas of
worsening ventilation. No acute osseous abnormality identified.
Paucity of bowel gas in the upper abdomen.
IMPRESSION: 1. Improved lung volumes and bibasilar ventilation. Small pleural
effusions.
2. Chronic pulmonary interstitial changes.  Cardiomegaly.

## 2019-09-11 ENCOUNTER — Emergency Department
Admission: EM | Admit: 2019-09-11 | Discharge: 2019-09-12 | Disposition: A | Discharge status: Home / Self Care | Payer: Medicaid Other | Attending: Student in an Organized Health Care Education/Training Program | Admitting: Student in an Organized Health Care Education/Training Program

## 2019-09-11 ENCOUNTER — Other Ambulatory Visit: Payer: Self-pay

## 2019-09-11 ENCOUNTER — Emergency Department: Payer: Medicaid Other

## 2019-09-11 ENCOUNTER — Encounter: Payer: Self-pay | Admitting: Emergency Medicine

## 2019-09-11 DIAGNOSIS — F1210 Cannabis abuse, uncomplicated: Secondary | ICD-10-CM | POA: Insufficient documentation

## 2019-09-11 DIAGNOSIS — M25461 Effusion, right knee: Secondary | ICD-10-CM | POA: Diagnosis not present

## 2019-09-11 DIAGNOSIS — F17210 Nicotine dependence, cigarettes, uncomplicated: Secondary | ICD-10-CM | POA: Diagnosis not present

## 2019-09-11 DIAGNOSIS — N186 End stage renal disease: Secondary | ICD-10-CM | POA: Insufficient documentation

## 2019-09-11 DIAGNOSIS — Z992 Dependence on renal dialysis: Secondary | ICD-10-CM | POA: Insufficient documentation

## 2019-09-11 DIAGNOSIS — R609 Edema, unspecified: Secondary | ICD-10-CM

## 2019-09-11 DIAGNOSIS — M25561 Pain in right knee: Secondary | ICD-10-CM | POA: Diagnosis present

## 2019-09-11 DIAGNOSIS — I120 Hypertensive chronic kidney disease with stage 5 chronic kidney disease or end stage renal disease: Secondary | ICD-10-CM | POA: Diagnosis not present

## 2019-09-11 DIAGNOSIS — M7989 Other specified soft tissue disorders: Secondary | ICD-10-CM

## 2019-09-11 DIAGNOSIS — Z86718 Personal history of other venous thrombosis and embolism: Secondary | ICD-10-CM | POA: Diagnosis not present

## 2019-09-11 DIAGNOSIS — Z7901 Long term (current) use of anticoagulants: Secondary | ICD-10-CM | POA: Insufficient documentation

## 2019-09-11 DIAGNOSIS — M7121 Synovial cyst of popliteal space [Baker], right knee: Secondary | ICD-10-CM | POA: Insufficient documentation

## 2019-09-11 DIAGNOSIS — E1122 Type 2 diabetes mellitus with diabetic chronic kidney disease: Secondary | ICD-10-CM | POA: Diagnosis not present

## 2019-09-11 MED ORDER — OXYCODONE HCL 5 MG PO TABS: 5.0000 mg | ORAL_TABLET | Freq: Three times a day (TID) | ORAL | 0 refills | Status: DC | PRN

## 2019-09-11 MED ORDER — OXYCODONE-ACETAMINOPHEN 5-325 MG PO TABS
1.0000 | ORAL_TABLET | Freq: Once | ORAL | Status: AC
  Administered 2019-09-11: 22:00:00 1 via ORAL
  Filled 2019-09-11: qty 1

## 2019-09-11 NOTE — ED Notes (Signed)
Pt states he has been experiencing right knee pain for 'a while"

## 2019-09-11 NOTE — ED Provider Notes (Signed)
[facility] EMERGENCY DEPARTMENT Provider Note   CSN: [id number] Arrival date & time: 09/11/19  1901     History Chief Complaint  Patient presents with   Knee Pain    Todd Brown is a 59 y.o. male with a history of hypertension, end-stage renal disease on dialysis, schizophrenia, diabetes, upper extremity DVT presents to the emergency department for evaluation of right knee pain and swelling.  Patient had a fall 2 days ago, states he tripped at home and landed on his right knee.  He has had pain and swelling throughout the right knee since.  He is having a hard time ambulating due to the pain, not taking any medications for his pain.  Denies hitting his head, losing conscious.  No nausea or vomiting.  No chest pain or shortness of breath.  No coughing.   HPI     Past Medical History:  Diagnosis Date   Anemia    Diabetes mellitus without complication ([place])    Dialysis patient ([place])    Mon. -Wed.- Fri   DVT (deep venous thrombosis) ([place])    cephalic and basolic vein thrombosis   ESRD (end stage renal disease) ([place])    Hyperlipidemia    Malignant hypertension    Renal artery stenosis ([place])    Schizophrenia [facility])     Patient Active Problem List   Diagnosis Date Noted   Fluid overload 10/08/2018   Acute pulmonary edema ([place]) 09/12/2018   Scrotal edema 05/10/2017   Symptomatic anemia 04/30/2017   Anticoagulated on Coumadin 09/14/2016   Cardiac arrest ([place]) 08/03/2016   Sepsis ([place]) 05/26/2016   Dialysis patient ([place]) 04/15/2016   End stage renal disease ([place]) 02/12/2016   Hyperkalemia 05/20/2015   Hepatitis C 09/26/2013   Schizophrenia ([place]) 03/23/2013   Essential hypertension, benign 03/23/2013   Chronic kidney disease 03/23/2013    Past Surgical History:  Procedure Laterality Date   A/V FISTULAGRAM N/A 08/06/2016   Procedure: A/V Fistulagram;  Surgeon: Algernon Huxley, MD;  Location: [place] CV LAB;  Service:  Cardiovascular;  Laterality: N/A;   A/V SHUNT INTERVENTION N/A 08/06/2016   Procedure: A/V Shunt Intervention;  Surgeon: Algernon Huxley, MD;  Location: [place] CV LAB;  Service: Cardiovascular;  Laterality: N/A;   AV FISTULA PLACEMENT Left 12/26/2014   Procedure: ARTERIOVENOUS (AV) FISTULA CREATION;  Surgeon: Algernon Huxley, MD;  Location: [facility] ORS;  Service: Vascular;  Laterality: Left;   ESOPHAGOGASTRODUODENOSCOPY (EGD) WITH PROPOFOL N/A 05/06/2017   Procedure: ESOPHAGOGASTRODUODENOSCOPY (EGD) WITH PROPOFOL;  Surgeon: Jonathon Bellows, MD;  Location: [facility] ENDOSCOPY;  Service: Gastroenterology;  Laterality: N/A;   INSERTION OF DIALYSIS CATHETER Right    PERIPHERAL VASCULAR CATHETERIZATION N/A 10/22/2014   Procedure: Dialysis/Perma Catheter Insertion;  Surgeon: Algernon Huxley, MD;  Location: [place] CV LAB;  Service: Cardiovascular;  Laterality: N/A;   PERIPHERAL VASCULAR CATHETERIZATION N/A 02/28/2015   Procedure: Dialysis/Perma Catheter Removal;  Surgeon: Algernon Huxley, MD;  Location: [place] CV LAB;  Service: Cardiovascular;  Laterality: N/A;   Repair fx left lower leg     yrs ago (age 63)       Family History  Problem Relation Age of Onset   Diabetes Neg Hx     Social History   Tobacco Use   Smoking status: Current Every Day Smoker    Packs/day: 0.50    Types: Cigarettes   Smokeless tobacco: Never Used   Tobacco comment: 10 cigars a day  Substance Use Topics  Alcohol use: No   Drug use: Yes    Frequency: 1.0 times per week    Types: Marijuana    Home Medications Prior to Admission medications   Medication Sig Start Date End Date Taking? Authorizing Provider  acetaminophen (TYLENOL) 500 MG tablet Take 500 mg by mouth every 4 (four) hours as needed for mild pain or moderate pain.     [provider]  albuterol (PROAIR HFA) 108 (90 Base) MCG/ACT inhaler Inhale 1-2 puffs into the lungs every 4 (four) hours as needed for wheezing or shortness of breath.      [provider]  amLODipine (NORVASC) 10 MG tablet Take 1 tablet (10 mg total) by mouth daily. 09/13/18   Bettey Costa, MD  calcium acetate (PHOSLO) 667 MG capsule Take 2 capsules (1,334 mg total) by mouth 3 (three) times daily with meals. 05/19/15   Fritzi Mandes, MD  carbamazepine (TEGRETOL XR) 200 MG 12 hr tablet Take 1,000 mg by mouth every morning.    [provider]  celecoxib (CELEBREX) 200 MG capsule Take 200 mg by mouth daily as needed for mild pain.    [provider]  cholecalciferol (VITAMIN D) 1000 UNITS tablet Take 1,000 Units by mouth daily.    [provider]  cloNIDine (CATAPRES) 0.3 MG tablet Take 0.3 mg by mouth 3 (three) times daily.    [provider]  ferrous sulfate 325 (65 FE) MG tablet Take 1 tablet (325 mg total) by mouth daily. 05/19/15   Fritzi Mandes, MD  fluPHENAZine (PROLIXIN) 2.5 MG tablet Take 2.5 mg by mouth 2 (two) times daily.     [provider]  Fluticasone-Salmeterol (ADVAIR) 500-50 MCG/DOSE AEPB Inhale 1 puff into the lungs 2 (two) times daily.    [provider]  hydrALAZINE (APRESOLINE) 100 MG tablet Take 1 tablet (100 mg total) by mouth 2 (two) times daily. 09/13/18   Bettey Costa, MD  minoxidil (LONITEN) 10 MG tablet Take 10 mg by mouth 2 (two) times daily.    [provider]  multivitamin (RENA-VIT) TABS tablet Take 1 tablet by mouth daily.    [provider]  omeprazole (PRILOSEC) 40 MG capsule Take 1 capsule (40 mg total) by mouth daily. 05/06/17   Henreitta Leber, MD  oxyCODONE (ROXICODONE) 5 MG immediate release tablet Take 1 tablet (5 mg total) by mouth every 8 (eight) hours as needed. 09/11/19 09/10/20  Duanne Guess, PA-C  trihexyphenidyl (ARTANE) 2 MG tablet Take 2 mg by mouth 2 (two) times daily.    [provider]    Allergies    Chlorpromazine  Review of Systems   Review of Systems  Constitutional: Negative for chills and fever.  Respiratory: Negative for  shortness of breath.   Cardiovascular: Positive for leg swelling (right). Negative for chest pain.  Gastrointestinal: Negative for abdominal pain.  Musculoskeletal: Positive for arthralgias, gait problem and joint swelling. Negative for myalgias.  Skin: Negative for rash and wound.  Neurological: Negative for dizziness, weakness, light-headedness and headaches.    Physical Exam Updated Vital Signs BP (!) 181/104 (BP Location: Right Arm)    Pulse 84    Temp 99.2 F (37.3 C) (Oral)    Resp 20    Ht 6\' 3"  (1.905 m)    Wt 81.6 kg    SpO2 94%    BMI 22.50 kg/m   Physical Exam Constitutional:      Appearance: Normal appearance. He is well-developed.  HENT:  Head: Normocephalic and atraumatic.     Right Ear: External ear normal.     Left Ear: External ear normal.  Eyes:     Extraocular Movements: Extraocular movements intact.     Conjunctiva/sclera: Conjunctivae normal.     Pupils: Pupils are equal, round, and reactive to light.  Cardiovascular:     Rate and Rhythm: Normal rate.  Pulmonary:     Effort: Pulmonary effort is normal. No respiratory distress.  Abdominal:     General: There is no distension.     Tenderness: There is no abdominal tenderness.  Musculoskeletal:        General: Normal range of motion.     Cervical back: Normal range of motion. No rigidity.     Comments: Right lower extremity with moderate effusion.  Slight warmth but no redness.  He has bilateral lower extremity peripheral edema, right side greater than left with more swelling in the calf on the right than left side.  There is no signs of cellulitis in the lower extremity.  Calf is slightly tender to palpation.  Is mostly tender to palpation along the superior lateral knee effusion.  Patella is nontender to palpation.  No palpable defect in the patellar tendon or quad tendon.  Patella tracks well.  Unable to maintain extension secondary to pain but flicker of active extension noted decreases concern for extensor  mechanism injury.  Normal hip range of motion with no discomfort.  Skin:    General: Skin is warm.     Findings: No rash.  Neurological:     General: No focal deficit present.     Mental Status: He is alert and oriented to person, place, and time. Mental status is at baseline.  Psychiatric:        Behavior: Behavior normal.        Thought Content: Thought content normal.     ED Results / Procedures / Treatments   Labs (all labs ordered are listed, but only abnormal results are displayed) Labs Reviewed - No data to display  EKG None  Radiology US Venous Img Lower Unilateral Right (DVT)  Result Date: 09/11/2019 CLINICAL DATA:  Right knee pain for 3 days EXAM: Right LOWER EXTREMITY VENOUS DOPPLER ULTRASOUND TECHNIQUE: Gray-scale sonography with compression, as well as color and duplex ultrasound, were performed to evaluate the deep venous system(s) from the level of the common femoral vein through the popliteal and proximal calf veins. COMPARISON:  None. FINDINGS: VENOUS Normal compressibility of the common femoral, superficial femoral, and popliteal veins, as well as the visualized calf veins. Visualized portions of profunda femoral vein and great saphenous vein unremarkable. No filling defects to suggest DVT on grayscale or color Doppler imaging. Doppler waveforms show normal direction of venous flow, normal respiratory phasicity and response to augmentation. Limited views of the contralateral common femoral vein are unremarkable. OTHER Edema within the calf soft tissues. Complex collection at the popliteal fossa measuring 6.1 x 3.1 x 3.9 cm extends to the medial knee. No significant internal flow. Limitations: none IMPRESSION: No femoropopliteal DVT nor evidence of DVT within the visualized calf veins. If clinical symptoms are inconsistent or if there are persistent or worsening symptoms, further imaging (possibly involving the iliac veins) may be warranted. Complex collection within the  popliteal fossa measuring 6.1 cm possibly representing a hematoma or complicated Baker's cyst. There is edema within the subcutaneous soft tissues of the right calf Electronically Signed   By: Donavan Foil M.D.   On: 09/11/2019 21:37  DG Knee Complete 4 Views Right  Result Date: 09/11/2019 CLINICAL DATA:  Right knee pain for 2 days after fall EXAM: RIGHT KNEE - COMPLETE 4+ VIEW COMPARISON:  None. FINDINGS: Frontal, bilateral oblique, lateral views of the right knee are obtained. No displaced fracture, subluxation, or dislocation. Joint spaces are well preserved. There is a moderate joint effusion. Diffuse vascular calcifications are seen. IMPRESSION: 1. Moderate joint effusion.  No acute fracture. Electronically Signed   By: Randa Ngo M.D.   On: 09/11/2019 20:36    Procedures Procedures (including critical care time)  Medications Ordered in ED Medications  oxyCODONE-acetaminophen (PERCOCET/ROXICET) 5-325 MG per tablet 1 tablet (1 tablet Oral Given 09/11/19 2132)    ED Course  I have reviewed the triage vital signs and the nursing notes.  Pertinent labs & imaging results that were available during my care of the patient were reviewed by me and considered in my medical decision making (see chart for details).    MDM Rules/Calculators/A&P                      59 year old male with fall and right knee pain.  Has mild right knee effusion with Baker's cyst.  No ligamentous laxity.  Some peripheral edema on the lower extremity, right side greater than left, ultrasound negative for DVT.  No signs of cellulitis.  No signs of septic joint.  Extensor mechanism appears to be intact.  He is placed into a knee immobilizer.  Will rest ice and elevate the knee.  Use knee immobilizer and walker as needed.  Call orthopedic office to schedule follow-up appointment. Final Clinical Impression(s) / ED Diagnoses Final diagnoses:  Swelling  Effusion of right knee  Acute pain of right knee  Swelling of  right lower extremity  Baker's cyst of knee, right    Rx / DC Orders ED Discharge Orders         Ordered    oxyCODONE (ROXICODONE) 5 MG immediate release tablet  Every 8 hours PRN     09/11/19 2302           Duanne Guess, PA-C 09/11/19 2304    Merlyn Lot, MD 09/11/19 2318

## 2019-09-11 NOTE — ED Triage Notes (Signed)
Pt presents to ED via POV with c/o R knee pain since Saturday. Pt states fell on Saturday and since then has has had R knee pain. Pt ambulatory at baseline with a cane.   Pt is dialysis patient had full treatment today.

## 2019-09-11 NOTE — Discharge Instructions (Addendum)
Please rest ice and elevate the right knee.  Use walker as needed for ambulation.  Take pain medication as needed for pain.  Call orthopedic office to schedule follow-up appointment.  Return to the ER for any increasing pain, swelling, worsening symptoms or to change in health.

## 2019-09-12 MED ORDER — OXYCODONE-ACETAMINOPHEN 5-325 MG PO TABS
1.0000 | ORAL_TABLET | Freq: Once | ORAL | Status: AC
  Administered 2019-09-12: 1 via ORAL
  Filled 2019-09-12: qty 1

## 2019-10-04 ENCOUNTER — Emergency Department
Admission: EM | Admit: 2019-10-04 | Discharge: 2019-10-04 | Disposition: A | Discharge status: Other Acute Inpatient Hospital | Payer: Medicaid Other | Attending: Emergency Medicine | Admitting: Emergency Medicine

## 2019-10-04 ENCOUNTER — Inpatient Hospital Stay
Admission: AD | Admit: 2019-10-04 | Discharge: 2019-10-23 | DRG: 885 | Disposition: A | Discharge status: Other Acute Inpatient Hospital | Payer: Medicaid Other | Source: Ambulatory Visit | Attending: Psychiatry | Admitting: Psychiatry

## 2019-10-04 ENCOUNTER — Other Ambulatory Visit: Payer: Self-pay

## 2019-10-04 ENCOUNTER — Encounter: Payer: Self-pay | Admitting: Emergency Medicine

## 2019-10-04 DIAGNOSIS — R443 Hallucinations, unspecified: Secondary | ICD-10-CM

## 2019-10-04 DIAGNOSIS — I10 Essential (primary) hypertension: Secondary | ICD-10-CM | POA: Diagnosis not present

## 2019-10-04 DIAGNOSIS — G319 Degenerative disease of nervous system, unspecified: Secondary | ICD-10-CM | POA: Diagnosis present

## 2019-10-04 DIAGNOSIS — F17290 Nicotine dependence, other tobacco product, uncomplicated: Secondary | ICD-10-CM | POA: Diagnosis present

## 2019-10-04 DIAGNOSIS — N2581 Secondary hyperparathyroidism of renal origin: Secondary | ICD-10-CM | POA: Diagnosis present

## 2019-10-04 DIAGNOSIS — I120 Hypertensive chronic kidney disease with stage 5 chronic kidney disease or end stage renal disease: Secondary | ICD-10-CM | POA: Insufficient documentation

## 2019-10-04 DIAGNOSIS — Z7901 Long term (current) use of anticoagulants: Secondary | ICD-10-CM

## 2019-10-04 DIAGNOSIS — D649 Anemia, unspecified: Secondary | ICD-10-CM | POA: Diagnosis present

## 2019-10-04 DIAGNOSIS — I469 Cardiac arrest, cause unspecified: Secondary | ICD-10-CM | POA: Diagnosis present

## 2019-10-04 DIAGNOSIS — E785 Hyperlipidemia, unspecified: Secondary | ICD-10-CM | POA: Diagnosis present

## 2019-10-04 DIAGNOSIS — Z7951 Long term (current) use of inhaled steroids: Secondary | ICD-10-CM | POA: Diagnosis not present

## 2019-10-04 DIAGNOSIS — N5089 Other specified disorders of the male genital organs: Secondary | ICD-10-CM | POA: Diagnosis present

## 2019-10-04 DIAGNOSIS — Z20822 Contact with and (suspected) exposure to covid-19: Secondary | ICD-10-CM | POA: Diagnosis present

## 2019-10-04 DIAGNOSIS — Z888 Allergy status to other drugs, medicaments and biological substances status: Secondary | ICD-10-CM

## 2019-10-04 DIAGNOSIS — Z791 Long term (current) use of non-steroidal anti-inflammatories (NSAID): Secondary | ICD-10-CM | POA: Diagnosis not present

## 2019-10-04 DIAGNOSIS — I701 Atherosclerosis of renal artery: Secondary | ICD-10-CM | POA: Diagnosis present

## 2019-10-04 DIAGNOSIS — K5900 Constipation, unspecified: Secondary | ICD-10-CM | POA: Diagnosis present

## 2019-10-04 DIAGNOSIS — A419 Sepsis, unspecified organism: Secondary | ICD-10-CM | POA: Diagnosis present

## 2019-10-04 DIAGNOSIS — F203 Undifferentiated schizophrenia: Secondary | ICD-10-CM | POA: Diagnosis not present

## 2019-10-04 DIAGNOSIS — R0602 Shortness of breath: Secondary | ICD-10-CM | POA: Diagnosis not present

## 2019-10-04 DIAGNOSIS — R456 Violent behavior: Secondary | ICD-10-CM | POA: Diagnosis present

## 2019-10-04 DIAGNOSIS — Z86718 Personal history of other venous thrombosis and embolism: Secondary | ICD-10-CM | POA: Diagnosis not present

## 2019-10-04 DIAGNOSIS — D631 Anemia in chronic kidney disease: Secondary | ICD-10-CM | POA: Diagnosis present

## 2019-10-04 DIAGNOSIS — E1122 Type 2 diabetes mellitus with diabetic chronic kidney disease: Secondary | ICD-10-CM | POA: Diagnosis present

## 2019-10-04 DIAGNOSIS — B1920 Unspecified viral hepatitis C without hepatic coma: Secondary | ICD-10-CM | POA: Diagnosis present

## 2019-10-04 DIAGNOSIS — E8770 Fluid overload, unspecified: Secondary | ICD-10-CM | POA: Diagnosis present

## 2019-10-04 DIAGNOSIS — R6520 Severe sepsis without septic shock: Secondary | ICD-10-CM | POA: Diagnosis present

## 2019-10-04 DIAGNOSIS — Z79899 Other long term (current) drug therapy: Secondary | ICD-10-CM

## 2019-10-04 DIAGNOSIS — G4700 Insomnia, unspecified: Secondary | ICD-10-CM | POA: Diagnosis present

## 2019-10-04 DIAGNOSIS — E039 Hypothyroidism, unspecified: Secondary | ICD-10-CM | POA: Diagnosis present

## 2019-10-04 DIAGNOSIS — Z992 Dependence on renal dialysis: Secondary | ICD-10-CM | POA: Diagnosis present

## 2019-10-04 DIAGNOSIS — F419 Anxiety disorder, unspecified: Secondary | ICD-10-CM | POA: Diagnosis present

## 2019-10-04 DIAGNOSIS — D638 Anemia in other chronic diseases classified elsewhere: Secondary | ICD-10-CM | POA: Diagnosis not present

## 2019-10-04 DIAGNOSIS — N186 End stage renal disease: Secondary | ICD-10-CM | POA: Diagnosis present

## 2019-10-04 DIAGNOSIS — N189 Chronic kidney disease, unspecified: Secondary | ICD-10-CM | POA: Diagnosis not present

## 2019-10-04 DIAGNOSIS — G2401 Drug induced subacute dyskinesia: Secondary | ICD-10-CM | POA: Diagnosis present

## 2019-10-04 DIAGNOSIS — I639 Cerebral infarction, unspecified: Secondary | ICD-10-CM

## 2019-10-04 DIAGNOSIS — F17210 Nicotine dependence, cigarettes, uncomplicated: Secondary | ICD-10-CM | POA: Insufficient documentation

## 2019-10-04 DIAGNOSIS — J810 Acute pulmonary edema: Secondary | ICD-10-CM | POA: Diagnosis present

## 2019-10-04 DIAGNOSIS — F329 Major depressive disorder, single episode, unspecified: Secondary | ICD-10-CM | POA: Diagnosis not present

## 2019-10-04 DIAGNOSIS — E875 Hyperkalemia: Secondary | ICD-10-CM | POA: Diagnosis not present

## 2019-10-04 DIAGNOSIS — R451 Restlessness and agitation: Secondary | ICD-10-CM | POA: Diagnosis present

## 2019-10-04 DIAGNOSIS — E559 Vitamin D deficiency, unspecified: Secondary | ICD-10-CM | POA: Diagnosis present

## 2019-10-04 DIAGNOSIS — J9601 Acute respiratory failure with hypoxia: Secondary | ICD-10-CM | POA: Diagnosis present

## 2019-10-04 DIAGNOSIS — I1311 Hypertensive heart and chronic kidney disease without heart failure, with stage 5 chronic kidney disease, or end stage renal disease: Secondary | ICD-10-CM | POA: Diagnosis not present

## 2019-10-04 DIAGNOSIS — J449 Chronic obstructive pulmonary disease, unspecified: Secondary | ICD-10-CM | POA: Diagnosis present

## 2019-10-04 DIAGNOSIS — Z9115 Patient's noncompliance with renal dialysis: Secondary | ICD-10-CM

## 2019-10-04 DIAGNOSIS — F209 Schizophrenia, unspecified: Secondary | ICD-10-CM | POA: Diagnosis present

## 2019-10-04 DIAGNOSIS — Z8619 Personal history of other infectious and parasitic diseases: Secondary | ICD-10-CM

## 2019-10-04 DIAGNOSIS — F1290 Cannabis use, unspecified, uncomplicated: Secondary | ICD-10-CM | POA: Diagnosis present

## 2019-10-04 DIAGNOSIS — J45909 Unspecified asthma, uncomplicated: Secondary | ICD-10-CM | POA: Diagnosis not present

## 2019-10-04 LAB — COMPREHENSIVE METABOLIC PANEL
ALT: 24 U/L (ref 0–44)
AST: 30 U/L (ref 15–41)
Albumin: 3.5 g/dL (ref 3.5–5.0)
Alkaline Phosphatase: 81 U/L (ref 38–126)
Anion gap: 10 (ref 5–15)
BUN: 26 mg/dL — ABNORMAL HIGH (ref 6–20)
CO2: 27 mmol/L (ref 22–32)
Calcium: 9.2 mg/dL (ref 8.9–10.3)
Chloride: 99 mmol/L (ref 98–111)
Creatinine, Ser: 4.45 mg/dL — ABNORMAL HIGH (ref 0.61–1.24)
GFR calc Af Amer: 16 mL/min — ABNORMAL LOW (ref >60)
GFR calc non Af Amer: 14 mL/min — ABNORMAL LOW (ref >60)
Glucose, Bld: 86 mg/dL (ref 70–99)
Potassium: 3.3 mmol/L — ABNORMAL LOW (ref 3.5–5.1)
Sodium: 136 mmol/L (ref 135–145)
Total Bilirubin: 0.6 mg/dL (ref 0.3–1.2)
Total Protein: 7.4 g/dL (ref 6.5–8.1)

## 2019-10-04 LAB — CBC
HCT: 35 % — ABNORMAL LOW (ref 39.0–52.0)
Hemoglobin: 11.5 g/dL — ABNORMAL LOW (ref 13.0–17.0)
MCH: 33 pg (ref 26.0–34.0)
MCHC: 32.9 g/dL (ref 30.0–36.0)
MCV: 100.6 fL — ABNORMAL HIGH (ref 80.0–100.0)
Platelets: 269 10*3/uL (ref 150–400)
RBC: 3.48 MIL/uL — ABNORMAL LOW (ref 4.22–5.81)
RDW: 15.2 % (ref 11.5–15.5)
WBC: 8 10*3/uL (ref 4.0–10.5)
nRBC: 0 % (ref 0.0–0.2)

## 2019-10-04 LAB — RESPIRATORY PANEL BY RT PCR (FLU A&B, COVID)
Influenza A by PCR: NEGATIVE
Influenza B by PCR: NEGATIVE
SARS Coronavirus 2 by RT PCR: NEGATIVE

## 2019-10-04 LAB — ETHANOL: Alcohol, Ethyl (B): <10 mg/dL (ref <10)

## 2019-10-04 MED ORDER — FERROUS SULFATE 325 (65 FE) MG PO TABS
325.0000 mg | ORAL_TABLET | Freq: Every day | ORAL | Status: DC
  Administered 2019-10-05 – 2019-10-22 (×18): 325 mg via ORAL
  Filled 2019-10-04 (×17): qty 1

## 2019-10-04 MED ORDER — ACETAMINOPHEN 500 MG PO TABS: 500.0000 mg | ORAL_TABLET | ORAL | Status: DC | PRN

## 2019-10-04 MED ORDER — CALCIUM ACETATE (PHOS BINDER) 667 MG PO CAPS
1334.0000 mg | ORAL_CAPSULE | Freq: Three times a day (TID) | ORAL | Status: DC
  Administered 2019-10-05 – 2019-10-22 (×52): 1334 mg via ORAL
  Filled 2019-10-04 (×56): qty 2

## 2019-10-04 MED ORDER — MOMETASONE FURO-FORMOTEROL FUM 200-5 MCG/ACT IN AERO
2.0000 | INHALATION_SPRAY | Freq: Two times a day (BID) | RESPIRATORY_TRACT | Status: DC
  Administered 2019-10-05 – 2019-10-22 (×36): 2 via RESPIRATORY_TRACT
  Filled 2019-10-04: qty 8.8

## 2019-10-04 MED ORDER — VITAMIN D 25 MCG (1000 UNIT) PO TABS
1000.0000 [IU] | ORAL_TABLET | Freq: Every day | ORAL | Status: DC
  Administered 2019-10-05 – 2019-10-22 (×18): 1000 [IU] via ORAL
  Filled 2019-10-04 (×18): qty 1

## 2019-10-04 MED ORDER — TRIHEXYPHENIDYL HCL 2 MG PO TABS
2.0000 mg | ORAL_TABLET | Freq: Two times a day (BID) | ORAL | Status: DC
  Administered 2019-10-05 – 2019-10-11 (×13): 2 mg via ORAL
  Filled 2019-10-04 (×14): qty 1

## 2019-10-04 MED ORDER — PANTOPRAZOLE SODIUM 40 MG PO TBEC
40.0000 mg | DELAYED_RELEASE_TABLET | Freq: Every day | ORAL | Status: DC
  Administered 2019-10-04: 40 mg via ORAL
  Filled 2019-10-04: qty 1

## 2019-10-04 MED ORDER — CARBAMAZEPINE ER 200 MG PO TB12
1000.0000 mg | ORAL_TABLET | ORAL | Status: DC
  Administered 2019-10-05 – 2019-10-22 (×18): 1000 mg via ORAL
  Filled 2019-10-04 (×19): qty 5

## 2019-10-04 MED ORDER — AMLODIPINE BESYLATE 5 MG PO TABS: 10.0000 mg | ORAL_TABLET | Freq: Every day | ORAL | Status: DC

## 2019-10-04 MED ORDER — ALBUTEROL SULFATE (2.5 MG/3ML) 0.083% IN NEBU
2.5000 mg | INHALATION_SOLUTION | RESPIRATORY_TRACT | Status: DC | PRN
  Filled 2019-10-04: qty 3

## 2019-10-04 MED ORDER — RENA-VITE PO TABS
1.0000 | ORAL_TABLET | Freq: Every day | ORAL | Status: DC
  Administered 2019-10-04: 1 via ORAL
  Filled 2019-10-04 (×2): qty 1

## 2019-10-04 MED ORDER — CALCIUM ACETATE (PHOS BINDER) 667 MG PO CAPS
1334.0000 mg | ORAL_CAPSULE | Freq: Three times a day (TID) | ORAL | Status: DC
  Filled 2019-10-04 (×2): qty 2

## 2019-10-04 MED ORDER — FLUPHENAZINE HCL 2.5 MG PO TABS
2.5000 mg | ORAL_TABLET | Freq: Two times a day (BID) | ORAL | Status: DC
  Administered 2019-10-04: 23:00:00 2.5 mg via ORAL
  Filled 2019-10-04 (×2): qty 1

## 2019-10-04 MED ORDER — MINOXIDIL 2.5 MG PO TABS
10.0000 mg | ORAL_TABLET | Freq: Two times a day (BID) | ORAL | Status: DC
  Administered 2019-10-04: 23:00:00 10 mg via ORAL
  Filled 2019-10-04 (×3): qty 4

## 2019-10-04 MED ORDER — CARBAMAZEPINE ER 200 MG PO TB12
1000.0000 mg | ORAL_TABLET | ORAL | Status: DC
  Filled 2019-10-04: qty 5

## 2019-10-04 MED ORDER — FLUPHENAZINE HCL 2.5 MG PO TABS
2.5000 mg | ORAL_TABLET | Freq: Two times a day (BID) | ORAL | Status: DC
  Administered 2019-10-05 – 2019-10-11 (×13): 2.5 mg via ORAL
  Filled 2019-10-04 (×14): qty 1

## 2019-10-04 MED ORDER — ACETAMINOPHEN 500 MG PO TABS
1000.0000 mg | ORAL_TABLET | Freq: Once | ORAL | Status: AC
  Administered 2019-10-04: 18:00:00 1000 mg via ORAL
  Filled 2019-10-04: qty 2

## 2019-10-04 MED ORDER — ACETAMINOPHEN 500 MG PO TABS
500.0000 mg | ORAL_TABLET | ORAL | Status: DC | PRN
  Administered 2019-10-05 – 2019-10-22 (×68): 500 mg via ORAL
  Filled 2019-10-04 (×69): qty 1

## 2019-10-04 MED ORDER — ALBUTEROL SULFATE (2.5 MG/3ML) 0.083% IN NEBU
2.5000 mg | INHALATION_SOLUTION | RESPIRATORY_TRACT | Status: DC | PRN
  Administered 2019-10-05 – 2019-10-13 (×5): 2.5 mg via RESPIRATORY_TRACT
  Filled 2019-10-04 (×6): qty 3

## 2019-10-04 MED ORDER — TRIHEXYPHENIDYL HCL 2 MG PO TABS
2.0000 mg | ORAL_TABLET | Freq: Two times a day (BID) | ORAL | Status: DC
  Administered 2019-10-04: 23:00:00 2 mg via ORAL
  Filled 2019-10-04 (×2): qty 1

## 2019-10-04 MED ORDER — VITAMIN D 25 MCG (1000 UNIT) PO TABS
1000.0000 [IU] | ORAL_TABLET | Freq: Every day | ORAL | Status: DC
  Administered 2019-10-04: 23:00:00 1000 [IU] via ORAL
  Filled 2019-10-04: qty 1

## 2019-10-04 MED ORDER — RENA-VITE PO TABS
1.0000 | ORAL_TABLET | Freq: Every day | ORAL | Status: DC
  Administered 2019-10-05 – 2019-10-22 (×17): 1 via ORAL
  Filled 2019-10-04 (×19): qty 1

## 2019-10-04 MED ORDER — PANTOPRAZOLE SODIUM 40 MG PO TBEC
40.0000 mg | DELAYED_RELEASE_TABLET | Freq: Every day | ORAL | Status: DC
  Administered 2019-10-05 – 2019-10-19 (×15): 40 mg via ORAL
  Filled 2019-10-04 (×16): qty 1

## 2019-10-04 MED ORDER — OXYCODONE HCL 5 MG PO TABS: 5.0000 mg | ORAL_TABLET | Freq: Three times a day (TID) | ORAL | Status: DC | PRN

## 2019-10-04 MED ORDER — FERROUS SULFATE 325 (65 FE) MG PO TABS
325.0000 mg | ORAL_TABLET | Freq: Every day | ORAL | Status: DC
  Filled 2019-10-04: qty 1

## 2019-10-04 MED ORDER — MOMETASONE FURO-FORMOTEROL FUM 200-5 MCG/ACT IN AERO
2.0000 | INHALATION_SPRAY | Freq: Two times a day (BID) | RESPIRATORY_TRACT | Status: DC
  Administered 2019-10-04: 2 via RESPIRATORY_TRACT
  Filled 2019-10-04: qty 8.8

## 2019-10-04 NOTE — Plan of Care (Signed)
  Problem: Education: Goal: Knowledge of [facility] Education information/materials will improve Outcome: Progressing Goal: Emotional status will improve Outcome: Progressing Goal: Mental status will improve Outcome: Progressing Goal: Verbalization of understanding the information provided will improve Outcome: Progressing   

## 2019-10-04 NOTE — Consult Note (Signed)
[place] Psychiatry Consult   Reason for Consult: Psychiatric evaluation Referring Physician: Dr Jari Pigg Patient Identification: Todd Brown MRN:  [id number] Principal Diagnosis: <principal problem not specified> Diagnosis:  Active Problems:   Schizophrenia ([place])   Essential hypertension, benign   Chronic kidney disease   Hepatitis C   Hyperkalemia   End stage renal disease ([place])   Dialysis patient ([place])   Sepsis ([place])   Cardiac arrest ([place])   Anticoagulated on Coumadin   Symptomatic anemia   Scrotal edema   Acute pulmonary edema (HCC)   Fluid overload   Total Time spent with patient: 30 minutes  Subjective: "I will know why I am here." Todd Brown is a 59 y.o. male patient presented to [facility] ED via EMS under involuntary commitment status (IVC).  Per the ED triage nursing note, the patient was sent from dialysis to the ED for psychiatric evaluation by his dialysis provider (Dr. Holley Raring).  It was reported that Dr. Holley Raring was concerned that the patient had not had dialysis for two weeks. His behavior was erratic, violent, and aggressive towards others during dialysis today (05.05.21).  It is believed that the patient has not been medication compliant.  The patient was seen face-to-face by this provider; chart reviewed and consulted with Dr.  Jari Pigg on 10/04/2019 due to the patient's care. It was discussed with the EDP that the patient does meet the criteria to be admitted to the psychiatric inpatient unit.  On evaluation, the patient is alert and oriented x 3, calm, but becomes irritated when asked questions, cooperative, and mood-congruent with affect.  The patient does not appear to be responding to internal or external stimuli. The patient does present with some delusional thinking. The patient denies auditory or visual hallucinations.  It was reported that during his triage assessment, he did present with auditory and visual hallucinations.  The patient denies any  suicidal, homicidal, or self-harm ideations.  "Ma'am, I do not want to hurt myself or nobody."  The patient is presenting with some psychotic and paranoid behaviors. During an encounter with the patient, he was unable to answer questions appropriately. Collateral was obtained from [place], the on-call crisis team member at [phone]. Ms. Carlis Abbott related that the patient has not been doing well.  She stated, "it is more of his physical health than his mental that we were concerned about."   Ms. Carlis Abbott related that they had been worried that they would find the patient dead one day. The patient had almost stopped providing care for himself. He was not attending to his ADLs as he has in the past. She expressed, "we had somebody clean his apartment; one of the counselor's last week said he was banging his head so hard, just extreme anger, and he's never hit his head so hard like that. And when he saw the doctor, she noticed that he wasn't taking his medications." Plan: The patient is a safety risk to self and does require psychiatric inpatient admission for stabilization and treatment.  HPI: Per Dr. Jari Pigg: Todd Brown is a 59 y.o. male on dialysis, DVT not on anticoagulation, schizophrenia who was brought in for psychiatric evaluation for increasing violent behavior visual and auditory hallucinations.  I actually received a call from Dr. Holley Raring yesterday.  Patient has not had dialysis for 2 weeks and he was very concerned that patient not been taking his medications and that because of his psychiatric illness that he was not taking care of himself.  He wanted the patient to be admitted for dialysis and for psychiatric evaluation.  Patient never showed up to the ER yesterday but did get dialysis today it appears and Dr. Holley Raring sent him here from dialysis for increasing violent behavior and visual and auditory hallucinations.  There is again concerned that patient is not taking  any of his medicines.  Patient himself is denying HI, SI.  He states that he has been taking his medications.  He his only concern is some right knee pain.  This is been going on since a few weeks ago he reports.  No new injuries.  Patient already had an x-ray and DVT ultrasounds that were negative.   Past Psychiatric History:  Schizophrenia ([place])  Risk to Self: Suicidal Ideation: No Suicidal Intent: No Is patient at risk for suicide?: No Suicidal Plan?: No Access to Means: No What has been your use of drugs/alcohol within the last 12 months?: None reported How many times?: 0 Other Self Harm Risks: None Triggers for Past Attempts: None known Intentional Self Injurious Behavior: None Risk to Others: Homicidal Ideation: No Thoughts of Harm to Others: No Current Homicidal Intent: No Current Homicidal Plan: No Access to Homicidal Means: No Identified Victim: None History of harm to others?: No Assessment of Violence: In past 6-12 months Violent Behavior Description: Patient became violent during his dialysis appointment Does patient have access to weapons?: No Criminal Charges Pending?: No Does patient have a court date: No Prior Inpatient Therapy: Prior Inpatient Therapy: (Unknown) Prior Outpatient Therapy: Prior Outpatient Therapy: Yes Prior Therapy Dates: Current Prior Therapy Facilty/Provider(s): [facility] ACT team Reason for Treatment: Schizophrenia  Does patient have an ACCT team?: Yes([facility]) Does patient have Intensive In-House Services?  : No Does patient have [facility] services? : No Does patient have P4CC services?: No  Past Medical History:  Past Medical History:  Diagnosis Date  . Anemia   . Diabetes mellitus without complication ([place])   . Dialysis patient [facility])    Mon. -Wed.- Fri  . 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 ESRD (end stage renal disease) ([place])   . Hyperlipidemia   .  Malignant hypertension   . Renal artery stenosis ([place])   . Schizophrenia [facility])     Past Surgical History:  Procedure Laterality Date  . A/V FISTULAGRAM N/A 08/06/2016   Procedure: A/V Fistulagram;  Surgeon: Algernon Huxley, MD;  Location: [place] CV LAB;  Service: Cardiovascular;  Laterality: N/A;  . A/V SHUNT INTERVENTION N/A 08/06/2016   Procedure: A/V Shunt Intervention;  Surgeon: Algernon Huxley, MD;  Location: [place] CV LAB;  Service: Cardiovascular;  Laterality: N/A;  . AV FISTULA PLACEMENT Left 12/26/2014   Procedure: ARTERIOVENOUS (AV) FISTULA CREATION;  Surgeon: Algernon Huxley, MD;  Location: [facility] ORS;  Service: Vascular;  Laterality: Left;  . ESOPHAGOGASTRODUODENOSCOPY (EGD) WITH PROPOFOL N/A 05/06/2017   Procedure: ESOPHAGOGASTRODUODENOSCOPY (EGD) WITH PROPOFOL;  Surgeon: Jonathon Bellows, MD;  Location: [facility] ENDOSCOPY;  Service: Gastroenterology;  Laterality: N/A;  . INSERTION OF DIALYSIS CATHETER Right   . PERIPHERAL VASCULAR CATHETERIZATION N/A 10/22/2014   Procedure: Dialysis/Perma Catheter Insertion;  Surgeon: Algernon Huxley, MD;  Location: [place] CV LAB;  Service: Cardiovascular;  Laterality: N/A;  . PERIPHERAL VASCULAR CATHETERIZATION N/A 02/28/2015   Procedure: Dialysis/Perma Catheter Removal;  Surgeon: Algernon Huxley, MD;  Location: [place] CV LAB;  Service: Cardiovascular;  Laterality: N/A;  . Repair fx left lower leg  yrs ago (age 31)   Family History:  Family History  Problem Relation Age of Onset  . Diabetes Neg Hx    Family Psychiatric  History: Unknown Social History:  Social History   Substance and Sexual Activity  Alcohol Use No     Social History   Substance and Sexual Activity  Drug Use Yes  . Frequency: 1.0 times per week  . Types: Marijuana    Social History   Socioeconomic History  . Marital status: Single    Spouse name: Not on file  . Number of children: Not on file  . Years of education: Not on file  . Highest education level: Not on  file  Occupational History  . Occupation: Disability   Tobacco Use  . Smoking status: Current Every Day Smoker    Packs/day: 0.50    Types: Cigarettes  . Smokeless tobacco: Never Used  . Tobacco comment: 10 cigars a day  Substance and Sexual Activity  . Alcohol use: No  . Drug use: Yes    Frequency: 1.0 times per week    Types: Marijuana  . Sexual activity: Not Currently  Other Topics Concern  . Not on file  Social History Narrative  . Not on file   Social Determinants of Health   Financial Resource Strain: Low Risk   . Difficulty of Paying Living Expenses: Not hard at all  Food Insecurity: No Food Insecurity  . Worried About Charity fundraiser in the Last Year: Never true  . Ran Out of Food in the Last Year: Never true  Transportation Needs: No Transportation Needs  . Lack of Transportation (Medical): No  . Lack of Transportation (Non-Medical): No  Physical Activity: Unknown  . Days of Exercise per Week: Patient refused  . Minutes of Exercise per Session: Patient refused  Stress: No Stress Concern Present  . Feeling of Stress : Not at all  Social Connections: Unknown  . Frequency of Communication with Friends and Family: Patient refused  . Frequency of Social Gatherings with Friends and Family: Patient refused  . Attends Religious Services: Patient refused  . Active Member of Clubs or Organizations: Patient refused  . Attends Archivist Meetings: Patient refused  . Marital Status: Patient refused   Additional Social History:    Allergies:   Allergies  Allergen Reactions  . Chlorpromazine Other (See Comments)    Reaction:  Unknown , pt states it makes him feel real bad Reaction:  Unknown , pt states it makes him feel real bad     Labs:  Results for orders placed or performed during the hospital encounter of 10/04/19 (from the past 48 hour(s))  Comprehensive metabolic panel     Status: Abnormal   Collection Time: 10/04/19  3:45 PM  Result Value Ref  Range   Sodium 136 135 - 145 mmol/L   Potassium 3.3 (L) 3.5 - 5.1 mmol/L   Chloride 99 98 - 111 mmol/L   CO2 27 22 - 32 mmol/L   Glucose, Bld 86 70 - 99 mg/dL    Comment: Glucose reference range applies only to samples taken after fasting for at least 8 hours.   BUN 26 (H) 6 - 20 mg/dL   Creatinine, Ser 4.45 (H) 0.61 - 1.24 mg/dL   Calcium 9.2 8.9 - 10.3 mg/dL   Total Protein 7.4 6.5 - 8.1 g/dL   Albumin 3.5 3.5 - 5.0 g/dL   AST 30 15 - 41 U/L   ALT 24 0 -  44 U/L   Alkaline Phosphatase 81 38 - 126 U/L   Total Bilirubin 0.6 0.3 - 1.2 mg/dL   GFR calc non Af Amer 14 (L) >60 mL/min   GFR calc Af Amer 16 (L) >60 mL/min   Anion gap 10 5 - 15    Comment: Performed at [facility], [address] [place], [place] [address]  Ethanol     Status: None   Collection Time: 10/04/19  3:45 PM  Result Value Ref Range   Alcohol, Ethyl (B) <10 <10 mg/dL    Comment: (NOTE) Lowest detectable limit for serum alcohol is 10 mg/dL. For medical purposes only. Performed at [facility], [address] [place], [place] [address]   cbc     Status: Abnormal   Collection Time: 10/04/19  3:45 PM  Result Value Ref Range   WBC 8.0 4.0 - 10.5 K/uL   RBC 3.48 (L) 4.22 - 5.81 MIL/uL   Hemoglobin 11.5 (L) 13.0 - 17.0 g/dL   HCT 35.0 (L) 39.0 - 52.0 %   MCV 100.6 (H) 80.0 - 100.0 fL   MCH 33.0 26.0 - 34.0 pg   MCHC 32.9 30.0 - 36.0 g/dL   RDW 15.2 11.5 - 15.5 %   Platelets 269 150 - 400 K/uL   nRBC 0.0 0.0 - 0.2 %    Comment: Performed at [facility], [address], [place], [place] [address]  Respiratory Panel by RT PCR (Flu A&B, Covid) - Nasopharyngeal Swab     Status: None   Collection Time: 10/04/19  6:26 PM   Specimen: Nasopharyngeal Swab  Result Value Ref Range   SARS Coronavirus 2 by RT PCR NEGATIVE NEGATIVE    Comment: (NOTE) SARS-CoV-2 target nucleic acids are NOT DETECTED. The SARS-CoV-2 RNA is generally detectable in upper respiratoy specimens during the  acute phase of infection. The lowest concentration of SARS-CoV-2 viral copies this assay can detect is 131 copies/mL. A negative result does not preclude SARS-Cov-2 infection and should not be used as the sole basis for treatment or other patient management decisions. A negative result may occur with  improper specimen collection/handling, submission of specimen other than nasopharyngeal swab, presence of viral mutation(s) within the areas targeted by this assay, and inadequate number of viral copies (<131 copies/mL). A negative result must be combined with clinical observations, patient history, and epidemiological information. The expected result is Negative. Fact Sheet for Patients:  PinkCheek.be Fact Sheet for Healthcare Providers:  GravelBags.it This test is not yet ap proved or cleared by the [place] FDA and  has been authorized for detection and/or diagnosis of SARS-CoV-2 by FDA under an Emergency Use Authorization (EUA). This EUA will remain  in effect (meaning this test can be used) for the duration of the COVID-19 declaration under Section 564(b)(1) of the Act, 21 U.S.C. section 360bbb-3(b)(1), unless the authorization is terminated or revoked sooner.    Influenza A by PCR NEGATIVE NEGATIVE   Influenza B by PCR NEGATIVE NEGATIVE    Comment: (NOTE) The Xpert Xpress SARS-CoV-2/FLU/RSV assay is intended as an aid in  the diagnosis of influenza from Nasopharyngeal swab specimens and  should not be used as a sole basis for treatment. Nasal washings and  aspirates are unacceptable for Xpert Xpress SARS-CoV-2/FLU/RSV  testing. Fact Sheet for Patients: PinkCheek.be Fact Sheet for Healthcare Providers: GravelBags.it This test is not yet approved or cleared by the [place] FDA and  has been authorized for detection and/or diagnosis of SARS-CoV-2  by  FDA  under an Emergency Use Authorization (EUA). This EUA will remain  in effect (meaning this test can be used) for the duration of the  Covid-19 declaration under Section 564(b)(1) of the Act, 21  U.S.C. section 360bbb-3(b)(1), unless the authorization is  terminated or revoked. Performed at [facility], [address] [place], [place] [address]     Current Facility-Administered Medications  Medication Dose Route Frequency Provider Last Rate Last Admin  . acetaminophen (TYLENOL) tablet 500 mg  500 mg Oral Q4H PRN Vanessa [place], MD      . albuterol (PROVENTIL) (2.5 MG/3ML) 0.083% nebulizer solution 2.5 mg  2.5 mg Inhalation Q4H PRN Vanessa [place], MD      . Derrill Memo ON 10/05/2019] calcium acetate (PHOSLO) capsule 1,334 mg  1,334 mg Oral TID WC Vanessa [place], MD      . Derrill Memo ON 10/05/2019] carbamazepine (TEGRETOL XR) 12 hr tablet 1,000 mg  1,000 mg Oral Marline Backbone, MD      . cholecalciferol (VITAMIN D3) tablet 1,000 Units  1,000 Units Oral Daily Vanessa [place], MD      . Derrill Memo ON 10/05/2019] ferrous sulfate tablet 325 mg  325 mg Oral Daily Vanessa [place], MD      . fluPHENAZine (PROLIXIN) tablet 2.5 mg  2.5 mg Oral BID Vanessa [place], MD      . minoxidil (LONITEN) tablet 10 mg  10 mg Oral BID Vanessa [place], MD      . mometasone-formoterol [facility]) 200-5 MCG/ACT inhaler 2 puff  2 puff Inhalation BID Vanessa [place], MD      . multivitamin (RENA-VIT) tablet 1 tablet  1 tablet Oral Daily Vanessa [place], MD      . pantoprazole (PROTONIX) EC tablet 40 mg  40 mg Oral Daily Vanessa [place], MD      . trihexyphenidyl (ARTANE) tablet 2 mg  2 mg Oral BID Vanessa [place], MD       Current Outpatient Medications  Medication Sig Dispense Refill  . acetaminophen (TYLENOL) 500 MG tablet Take 500 mg by mouth every 4 (four) hours as needed for mild pain or moderate pain.     Marland Kitchen albuterol (PROAIR HFA) 108 (90 Base) MCG/ACT inhaler Inhale 1-2 puffs into the lungs every 4 (four) hours as needed for wheezing  or shortness of breath.     Marland Kitchen amLODipine (NORVASC) 10 MG tablet Take 1 tablet (10 mg total) by mouth daily.    . calcium acetate (PHOSLO) 667 MG capsule Take 2 capsules (1,334 mg total) by mouth 3 (three) times daily with meals. 60 capsule 1  . carbamazepine (TEGRETOL XR) 200 MG 12 hr tablet Take 1,000 mg by mouth every morning.    . celecoxib (CELEBREX) 200 MG capsule Take 200 mg by mouth daily as needed for mild pain.    . cholecalciferol (VITAMIN D) 1000 UNITS tablet Take 1,000 Units by mouth daily.    . cloNIDine (CATAPRES) 0.3 MG tablet Take 0.3 mg by mouth 3 (three) times daily.    . ferrous sulfate 325 (65 FE) MG tablet Take 1 tablet (325 mg total) by mouth daily. 30 tablet 3  . fluPHENAZine (PROLIXIN) 2.5 MG tablet Take 2.5 mg by mouth 2 (two) times daily.     . Fluticasone-Salmeterol (ADVAIR) 500-50 MCG/DOSE AEPB Inhale 1 puff into the lungs 2 (two) times daily.    . hydrALAZINE (APRESOLINE) 100 MG tablet Take 1 tablet (100 mg total)  by mouth 2 (two) times daily.    . minoxidil (LONITEN) 10 MG tablet Take 10 mg by mouth 2 (two) times daily.    . multivitamin (RENA-VIT) TABS tablet Take 1 tablet by mouth daily.    Marland Kitchen omeprazole (PRILOSEC) 40 MG capsule Take 1 capsule (40 mg total) by mouth daily. 30 capsule 1  . oxyCODONE (ROXICODONE) 5 MG immediate release tablet Take 1 tablet (5 mg total) by mouth every 8 (eight) hours as needed. 10 tablet 0  . trihexyphenidyl (ARTANE) 2 MG tablet Take 2 mg by mouth 2 (two) times daily.      Musculoskeletal: Strength & Muscle Tone: within normal limits Gait & Station: normal Patient leans: N/A  Psychiatric Specialty Exam: Physical Exam  Nursing note and vitals reviewed. Constitutional: He is oriented to person, place, and time. He appears well-developed.  Cardiovascular: Normal rate.  Respiratory: Effort normal.  Musculoskeletal:        General: Normal range of motion.     Cervical back: Normal range of motion and neck supple.  Neurological:  He is alert and oriented to person, place, and time.    Review of Systems  Psychiatric/Behavioral: Positive for agitation, behavioral problems and hallucinations. The patient is nervous/anxious.   All other systems reviewed and are negative.   Blood pressure 114/89, pulse 75, temperature 98.8 F (37.1 C), temperature source Oral, resp. rate 16, height 6\' 3"  (1.905 m), weight 81.6 kg, SpO2 94 %.Body mass index is 22.5 kg/m.  General Appearance: Casual and Disheveled  Eye Contact:  None  Speech:  Garbled and Pressured  Volume:  Normal  Mood:  Anxious and Irritable  Affect:  Congruent, Inappropriate and Full Range  Thought Process:  Disorganized  Orientation:  Full (Time, Place, and Person)  Thought Content:  Illogical, Obsessions and Rumination  Suicidal Thoughts:  No  Homicidal Thoughts:  No  Memory:  Immediate;   Poor Recent;   Poor Remote;   Poor  Judgement:  Impaired  Insight:  Lacking  Psychomotor Activity:  Increased  Concentration:  Concentration: Poor and Attention Span: Poor  Recall:  Poor  Fund of Knowledge:  Poor  Language:  Poor  Akathisia:  Negative  Handed:  Right  AIMS (if indicated):     Assets:  Communication Skills Desire for Improvement Physical Health Resilience  ADL's:  Intact  Cognition:  Impaired,  Mild  Sleep:        Treatment Plan Summary: Medication management and Plan Patient meets criteria for psychiatric inpatient admission.  Disposition: Recommend psychiatric Inpatient admission when medically cleared. Supportive therapy provided about ongoing stressors.  Caroline Sauger, NP 10/04/2019 9:41 PM

## 2019-10-04 NOTE — ED Notes (Signed)
VOLUNTARY

## 2019-10-04 NOTE — Progress Notes (Signed)
Patient admitted after having an outburst in dialysis. Patient has significant tardive dyskinesia and no teeth so his speech is very difficult to understand. When asked about his outburst he said "I was being hateful". Pt has a fistula in left upper extremity that is intact. Skin search reveals no contraband and some scarring and dry skin to both lower extremities. He has 2+pitting edema to right lower extremity. Skin is dry and flaking but intact. Not voicing any SI or HI. Denies complaints. Not reporting any auditory or visual hallucinations. Pleasant and cooperative.

## 2019-10-04 NOTE — ED Notes (Signed)
Patient had 2 inhalers in shirt pocket in the blue scrub top. This writer removed from pocket and explained to patient he was not able to have medications at the bedside.

## 2019-10-04 NOTE — BH Assessment (Signed)
PATIENT BED AVAILABLE AFTER 11PM  Patient is to be admitted to [facility] by Psychiatric Nurse Practitioner Caroline Sauger.  Attending Physician will be Dr. Weber Cooks.   Patient has been assigned to room 315, by [place].    ER staff is aware of the admission:  [place] ER Secretary    Dr. Jari Pigg , ER MD   Joelene Millin Patient's Nurse   Earlie Server Patient Access.

## 2019-10-04 NOTE — BH Assessment (Signed)
Assessment Note  Todd Brown is an 59 y.o. male presenting to [facility] ED under IVC. Per triage note Pt sent from dialysis for psych evaluation for increasing violent behavior and visual/auditory hallucinations.  Pt takes medications and has ACT team but Dr. Holley Raring believes pt has not been taking medications appropriately. Pt sent by Dr. Holley Raring from [place] for psych eval. Pt has hx schizophrenia, apparently had AVH and violent behavior at dialysis. Pt confused in triage, could not say why he was transported here. Pt's main complaint is bil leg pain from fall 3wks ago. During assessment patient was alert, speech was pressured and patient had what sounded like a severe stutter,patient seemed to only be oriented to who he was and where he was but not the situation and why he is here, but patient was cooperative and calm no signs of aggressiveness during assessment. Patient reported "I don't know why I'm here, I don't know what happened." Patient reported "I'm a dialysis patient and they aggravate me sometimes." Patient was able to report that he lives alone and continued to ask about his medication, after the Psyc NP communicated to the patient that he will get his medication he continued to ask "well how do ya'll know what medication I'm on" "when will I get it?" Patient denies SI/HI/AH/VH.  Collateral information was obtained from [place] who was the on call crisis team member at [phone] "He's not doing well at all. We have had some concerns with his eating he is not physically taking care of himself, he's on dialysis and they are really concerned about him, we are worried we are going to find him dead, we just don't think he's capable of living by himself. He's decreased in his ADL's we had somebody clean his apartment, one of the counselor's last week said he was banging his head so hard, just extreme anger and he's never hit his head so hard like that. And when he saw  the doctor she noticed that he wasn't taking his medications."  Per Psyc NP patient is recommended for Inpatient Hospitalization.   Diagnosis: Schizophrenia  Past Medical History:  Past Medical History:  Diagnosis Date  . Anemia   . Diabetes mellitus without complication ([place])   . Dialysis patient [facility])    Mon. -Wed.- Fri  . 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 ESRD (end stage renal disease) ([place])   . Hyperlipidemia   . Malignant hypertension   . Renal artery stenosis ([place])   . Schizophrenia [facility])     Past Surgical History:  Procedure Laterality Date  . A/V FISTULAGRAM N/A 08/06/2016   Procedure: A/V Fistulagram;  Surgeon: Algernon Huxley, MD;  Location: [place] CV LAB;  Service: Cardiovascular;  Laterality: N/A;  . A/V SHUNT INTERVENTION N/A 08/06/2016   Procedure: A/V Shunt Intervention;  Surgeon: Algernon Huxley, MD;  Location: [place] CV LAB;  Service: Cardiovascular;  Laterality: N/A;  . AV FISTULA PLACEMENT Left 12/26/2014   Procedure: ARTERIOVENOUS (AV) FISTULA CREATION;  Surgeon: Algernon Huxley, MD;  Location: [facility] ORS;  Service: Vascular;  Laterality: Left;  . ESOPHAGOGASTRODUODENOSCOPY (EGD) WITH PROPOFOL N/A 05/06/2017   Procedure: ESOPHAGOGASTRODUODENOSCOPY (EGD) WITH PROPOFOL;  Surgeon: Jonathon Bellows, MD;  Location: [facility] ENDOSCOPY;  Service: Gastroenterology;  Laterality: N/A;  . INSERTION OF DIALYSIS CATHETER Right   . PERIPHERAL VASCULAR CATHETERIZATION N/A 10/22/2014   Procedure: Dialysis/Perma Catheter Insertion;  Surgeon: Algernon Huxley, MD;  Location: [place] CV LAB;  Service: Cardiovascular;  Laterality: N/A;  . PERIPHERAL VASCULAR CATHETERIZATION N/A 02/28/2015   Procedure: Dialysis/Perma Catheter Removal;  Surgeon: Algernon Huxley, MD;  Location: [place] CV LAB;  Service: Cardiovascular;  Laterality: N/A;  . Repair fx left lower leg     yrs ago (age 44)    Family History:  Family History  Problem Relation Age of  Onset  . Diabetes Neg Hx     Social History:  reports that he has been smoking cigarettes. He has been smoking about 0.50 packs per day. He has never used smokeless tobacco. He reports current drug use. Frequency: 1.00 time per week. Drug: Marijuana. He reports that he does not drink alcohol.  Additional Social History:  Alcohol / Drug Use Pain Medications: See MAR Prescriptions: See MAR Over the Counter: See MAR History of alcohol / drug use?: No history of alcohol / drug abuse  CIWA: CIWA-Ar BP: 114/89 Pulse Rate: 75 COWS:    Allergies:  Allergies  Allergen Reactions  . Chlorpromazine Other (See Comments)    Reaction:  Unknown , pt states it makes him feel real bad Reaction:  Unknown , pt states it makes him feel real bad     Home Medications: (Not in a hospital admission)   OB/GYN Status:  No LMP for male patient.  General Assessment Data Location of Assessment: [facility] ED TTS Assessment: In system Is this a Tele or Face-to-Face Assessment?: Face-to-Face Is this an Initial Assessment or a Re-assessment for this encounter?: Initial Assessment Patient Accompanied by:: N/A Language Other than English: No Living Arrangements: Other (Comment)(Private Residence) What gender do you identify as?: Male Living Arrangements: Alone Can pt return to current living arrangement?: Yes Admission Status: Involuntary Petitioner: Other Is patient capable of signing voluntary admission?: No Referral Source: Other Insurance type: Medicaid  Medical Screening Exam ([place]) Medical Exam completed: Yes  Crisis Care Plan Living Arrangements: Alone Legal Guardian: Other:(Self) Name of Psychiatrist: [facility] Name of Therapist: [facility]  Education Status Is patient currently in school?: No Is the patient employed, unemployed or receiving disability?: Receiving disability income  Risk to self with the past 6  months Suicidal Ideation: No Has patient been a risk to self within the past 6 months prior to admission? : No Suicidal Intent: No Has patient had any suicidal intent within the past 6 months prior to admission? : No Is patient at risk for suicide?: No Suicidal Plan?: No Has patient had any suicidal plan within the past 6 months prior to admission? : No Access to Means: No What has been your use of drugs/alcohol within the last 12 months?: None reported Previous Attempts/Gestures: No How many times?: 0 Other Self Harm Risks: None Triggers for Past Attempts: None known Intentional Self Injurious Behavior: None Family Suicide History: Unknown Recent stressful life event(s): Other (Comment)(Physical Health issues) Persecutory voices/beliefs?: Yes Depression: Yes Depression Symptoms: Isolating, Loss of interest in usual pleasures Substance abuse history and/or treatment for substance abuse?: No Suicide prevention information given to non-admitted patients: Not applicable  Risk to Others within the past 6 months Homicidal Ideation: No Does patient have any lifetime risk of violence toward others beyond the six months prior to admission? : No Thoughts of Harm to Others: No Current Homicidal Intent: No Current Homicidal Plan: No Access to Homicidal Means: No Identified Victim: None History of harm to others?: No Assessment of Violence: In past 6-12 months Violent Behavior  Description: Patient became violent during his dialysis appointment Does patient have access to weapons?: No Criminal Charges Pending?: No Does patient have a court date: No Is patient on probation?: No  Psychosis Hallucinations: Auditory, Visual Delusions: None noted(Patient could be experiencing some delusions)  Mental Status Report Appearance/Hygiene: In scrubs Eye Contact: Poor Motor Activity: Freedom of movement Speech: Pressured Level of Consciousness: Alert Mood: Irritable Affect: Appropriate to  circumstance Anxiety Level: Moderate Thought Processes: Coherent Judgement: Partial Orientation: Person Obsessive Compulsive Thoughts/Behaviors: None  Cognitive Functioning Concentration: Normal Memory: Recent Impaired, Remote Impaired Is patient IDD: No Insight: Poor Impulse Control: Poor Appetite: Fair Have you had any weight changes? : No Change Sleep: Decreased Total Hours of Sleep: 3 Vegetative Symptoms: None  ADLScreening [facility] Services) Patient's cognitive ability adequate to safely complete daily activities?: Yes Patient able to express need for assistance with ADLs?: Yes Independently performs ADLs?: Yes (appropriate for developmental age)  Prior Inpatient Therapy Prior Inpatient Therapy: (Unknown)  Prior Outpatient Therapy Prior Outpatient Therapy: Yes Prior Therapy Dates: Current Prior Therapy Facilty/Provider(s): [facility] Reason for Treatment: Schizophrenia  Does patient have an ACCT team?: Yes([facility]) Does patient have Intensive In-House Services?  : No Does patient have [facility] services? : No Does patient have P4CC services?: No  ADL Screening (condition at time of admission) Patient's cognitive ability adequate to safely complete daily activities?: Yes Is the patient deaf or have difficulty hearing?: No Does the patient have difficulty seeing, even when wearing glasses/contacts?: No Does the patient have difficulty concentrating, remembering, or making decisions?: No Patient able to express need for assistance with ADLs?: Yes Does the patient have difficulty dressing or bathing?: No Independently performs ADLs?: Yes (appropriate for developmental age) Does the patient have difficulty walking or climbing stairs?: No Weakness of Legs: None Weakness of Arms/Hands: None  Home Assistive Devices/Equipment Home Assistive Devices/Equipment: None  Therapy Consults (therapy consults require a physician  order) PT Evaluation Needed: No OT Evalulation Needed: No SLP Evaluation Needed: No Abuse/Neglect Assessment (Assessment to be complete while patient is alone) Abuse/Neglect Assessment Can Be Completed: Unable to assess, patient is non-responsive or altered mental status Values / Beliefs Cultural Requests During Hospitalization: None Spiritual Requests During Hospitalization: None Consults Spiritual Care Consult Needed: No Transition of Care Team Consult Needed: No Advance Directives (For Healthcare) Does Patient Have a Medical Advance Directive?: No Would patient like information on creating a medical advance directive?: No - Patient declined          Disposition: Per Psyc NP patient is recommended for Inpatient Hospitalization.  Disposition Initial Assessment Completed for this Encounter: Yes  On Site Evaluation by:   Reviewed with Physician:    Leonie Douglas MS LCASA 10/04/2019 9:30 PM

## 2019-10-04 NOTE — ED Triage Notes (Signed)
See first nurse note. Pt sent by Dr. Holley Raring from [place] for psych eval. Pt has hx schizophrenia, apparently had AVH and violent behavior at dialysis. Pt confused in triage, could not say why he was transported here. Pt's main complaint is bil leg pain from fall 3wks ago.

## 2019-10-04 NOTE — ED Notes (Signed)
Pt dressed out. Belongings include: shoes, black shirt, black socks, black hat, belt, jeans, wallet, single key on lanyard, gray shirt, and blue duffle bag with meds and cigarettes inside.

## 2019-10-04 NOTE — ED Notes (Signed)
Pt. Alert and oriented, warm and dry, in no distress. Pt. Denies SI, HI, and AVH. Pt. Encouraged to let nursing staff know of any concerns or needs. 

## 2019-10-04 NOTE — ED Notes (Signed)
FIRST NURSE: Pt sent from dialysis for psych evaluation for increasing violent behavior and visual/auditory hallucinations.  Pt takes medications and has ACT team but Dr. Holley Raring believes pt has not been taking medications appropriately.

## 2019-10-04 NOTE — ED Notes (Signed)
Patient swabbed for covid.

## 2019-10-04 NOTE — ED Provider Notes (Signed)
[facility] Emergency Department Provider Note  ____________________________________________   First MD Initiated Contact with Patient 10/04/19 1704     (approximate)  I have reviewed the triage vital signs and the nursing notes.   HISTORY  Chief Complaint Psychiatric Evaluation    HPI Todd Brown is a 59 y.o. male on dialysis, DVT not on anticoagulation, schizophrenia who was brought in for psychiatric evaluation for increasing violent behavior visual and auditory hallucinations.  I actually received a call from Dr. Holley Raring yesterday.  Patient has not had dialysis for 2 weeks and he was very concerned that patient not been taking his medications and that because of his psychiatric illness that he was not taking care of himself.  He wanted the patient to be admitted for dialysis and for psychiatric evaluation.  Patient never showed up to the ER yesterday but did get dialysis today it appears and Dr. Holley Raring sent him here from dialysis for increasing violent behavior and visual and auditory hallucinations.  There is again concerned that patient is not taking any of his medicines.  Patient himself is denying HI, SI.  He states that he has been taking his medications.  He his only concern is some right knee pain.  This is been going on since a few weeks ago he reports.  No new injuries.  Patient already had an x-ray and DVT ultrasounds that were negative.           Past Medical History:  Diagnosis Date  . Anemia   . Diabetes mellitus without complication ([place])   . Dialysis patient [facility])    Mon. -Wed.- Fri  . 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 ESRD (end stage renal disease) ([place])   . Hyperlipidemia   . Malignant hypertension   . Renal artery stenosis ([place])   . Schizophrenia [facility])     Patient Active Problem List   Diagnosis Date Noted  . Fluid overload 10/08/2018  . Acute pulmonary edema ([place]) 09/12/2018    . Scrotal edema 05/10/2017  . Symptomatic anemia 04/30/2017  . Anticoagulated on Coumadin 09/14/2016  . Cardiac arrest ([place]) 08/03/2016  . Sepsis ([place]) 05/26/2016  . Dialysis patient ([place]) 04/15/2016  . End stage renal disease ([place]) 02/12/2016  . Hyperkalemia 05/20/2015  . Hepatitis C 09/26/2013  . Schizophrenia ([place]) 03/23/2013  . Essential hypertension, benign 03/23/2013  . Chronic kidney disease 03/23/2013    Past Surgical History:  Procedure Laterality Date  . A/V FISTULAGRAM N/A 08/06/2016   Procedure: A/V Fistulagram;  Surgeon: Algernon Huxley, MD;  Location: [place] CV LAB;  Service: Cardiovascular;  Laterality: N/A;  . A/V SHUNT INTERVENTION N/A 08/06/2016   Procedure: A/V Shunt Intervention;  Surgeon: Algernon Huxley, MD;  Location: [place] CV LAB;  Service: Cardiovascular;  Laterality: N/A;  . AV FISTULA PLACEMENT Left 12/26/2014   Procedure: ARTERIOVENOUS (AV) FISTULA CREATION;  Surgeon: Algernon Huxley, MD;  Location: [facility] ORS;  Service: Vascular;  Laterality: Left;  . ESOPHAGOGASTRODUODENOSCOPY (EGD) WITH PROPOFOL N/A 05/06/2017   Procedure: ESOPHAGOGASTRODUODENOSCOPY (EGD) WITH PROPOFOL;  Surgeon: Jonathon Bellows, MD;  Location: [facility] ENDOSCOPY;  Service: Gastroenterology;  Laterality: N/A;  . INSERTION OF DIALYSIS CATHETER Right   . PERIPHERAL VASCULAR CATHETERIZATION N/A 10/22/2014   Procedure: Dialysis/Perma Catheter Insertion;  Surgeon: Algernon Huxley, MD;  Location: [place] CV LAB;  Service: Cardiovascular;  Laterality: N/A;  . PERIPHERAL VASCULAR CATHETERIZATION N/A 02/28/2015   Procedure: Dialysis/Perma  Catheter Removal;  Surgeon: Algernon Huxley, MD;  Location: [name] CV LAB;  Service: Cardiovascular;  Laterality: N/A;  . Repair fx left lower leg     yrs ago (age 9)    Prior to Admission medications   Medication Sig Start Date End Date Taking? Authorizing Provider  acetaminophen (TYLENOL) 500 MG tablet Take 500 mg by mouth every 4 (four) hours as needed for mild  pain or moderate pain.     [provider]  albuterol (PROAIR HFA) 108 (90 Base) MCG/ACT inhaler Inhale 1-2 puffs into the lungs every 4 (four) hours as needed for wheezing or shortness of breath.     [provider]  amLODipine (NORVASC) 10 MG tablet Take 1 tablet (10 mg total) by mouth daily. 09/13/18   Bettey Costa, MD  calcium acetate (PHOSLO) 667 MG capsule Take 2 capsules (1,334 mg total) by mouth 3 (three) times daily with meals. 05/19/15   Fritzi Mandes, MD  carbamazepine (TEGRETOL XR) 200 MG 12 hr tablet Take 1,000 mg by mouth every morning.    [provider]  celecoxib (CELEBREX) 200 MG capsule Take 200 mg by mouth daily as needed for mild pain.    [provider]  cholecalciferol (VITAMIN D) 1000 UNITS tablet Take 1,000 Units by mouth daily.    [provider]  cloNIDine (CATAPRES) 0.3 MG tablet Take 0.3 mg by mouth 3 (three) times daily.    [provider]  ferrous sulfate 325 (65 FE) MG tablet Take 1 tablet (325 mg total) by mouth daily. 05/19/15   Fritzi Mandes, MD  fluPHENAZine (PROLIXIN) 2.5 MG tablet Take 2.5 mg by mouth 2 (two) times daily.     [provider]  Fluticasone-Salmeterol (ADVAIR) 500-50 MCG/DOSE AEPB Inhale 1 puff into the lungs 2 (two) times daily.    [provider]  hydrALAZINE (APRESOLINE) 100 MG tablet Take 1 tablet (100 mg total) by mouth 2 (two) times daily. 09/13/18   Bettey Costa, MD  minoxidil (LONITEN) 10 MG tablet Take 10 mg by mouth 2 (two) times daily.    [provider]  multivitamin (RENA-VIT) TABS tablet Take 1 tablet by mouth daily.    [provider]  omeprazole (PRILOSEC) 40 MG capsule Take 1 capsule (40 mg total) by mouth daily. 05/06/17   Henreitta Leber, MD  oxyCODONE (ROXICODONE) 5 MG immediate release tablet Take 1 tablet (5 mg total) by mouth every 8 (eight) hours as needed. 09/11/19 09/10/20  Duanne Guess, PA-C  trihexyphenidyl (ARTANE) 2 MG tablet Take 2 mg  by mouth 2 (two) times daily.    [provider]    Allergies Chlorpromazine  Family History  Problem Relation Age of Onset  . Diabetes Neg Hx     Social History Social History   Tobacco Use  . Smoking status: Current Every Day Smoker    Packs/day: 0.50    Types: Cigarettes  . Smokeless tobacco: Never Used  . Tobacco comment: 10 cigars a day  Substance Use Topics  . Alcohol use: No  . Drug use: Yes    Frequency: 1.0 times per week    Types: Marijuana      Review of Systems Constitutional: No fever/chills Eyes: No visual changes. ENT: No sore throat. Cardiovascular: Denies chest pain. Respiratory: Denies shortness of breath. Gastrointestinal: No abdominal pain.  No nausea, no vomiting.  No diarrhea.  No constipation. Genitourinary: Negative for dysuria. Musculoskeletal: Negative for back pain.  Positive right knee pain  Skin: Negative for rash. Neurological: Negative for headaches, focal weakness or numbness. All other ROS negative ____________________________________________   PHYSICAL EXAM:  VITAL SIGNS: ED Triage Vitals  Enc Vitals Group     BP 10/04/19 1537 (!) 180/98     Pulse Rate 10/04/19 1537 79     Resp 10/04/19 1537 18     Temp 10/04/19 1537 98.8 F (37.1 C)     Temp Source 10/04/19 1537 Oral     SpO2 10/04/19 1537 93 %     Weight 10/04/19 1536 180 lb (81.6 kg)     Height 10/04/19 1536 6\' 3"  (1.905 m)     Head Circumference --      Peak Flow --      Pain Score 10/04/19 1538 7     Pain Loc --      Pain Edu? --      Excl. in [place]? --     Constitutional: Alert and oriented. Well appearing and in no acute distress. Eyes: Conjunctivae are normal. EOMI. Head: Atraumatic. Nose: No congestion/rhinnorhea. Mouth/Throat: Mucous membranes are moist.   Neck: No stridor. Trachea Midline. FROM Cardiovascular: Normal rate, regular rhythm. Grossly normal heart sounds.  Good peripheral circulation. Respiratory: Normal respiratory effort.  No  retractions. Lungs CTAB. Gastrointestinal: Soft and nontender. No distention. No abdominal bruits.  Musculoskeletal: Right knee he is able to range, no signs of any trauma.  No redness, no warmth Neurologic:  Normal speech and language. No gross focal neurologic deficits are appreciated.  Skin:  Skin is warm, dry and intact. No rash noted. Psychiatric: Patient has odd affect but denies any SI, HI, hallucinations GU: Deferred   ____________________________________________   LABS (all labs ordered are listed, but only abnormal results are displayed)  Labs Reviewed  COMPREHENSIVE METABOLIC PANEL - Abnormal; Notable for the following components:      Result Value   Potassium 3.3 (*)    BUN 26 (*)    Creatinine, Ser 4.45 (*)    GFR calc non Af Amer 14 (*)    GFR calc Af Amer 16 (*)    All other components within normal limits  CBC - Abnormal; Notable for the following components:   RBC 3.48 (*)    Hemoglobin 11.5 (*)    HCT 35.0 (*)    MCV 100.6 (*)    All other components within normal limits  ETHANOL  URINE DRUG SCREEN, QUALITATIVE ([facility] ONLY)   ____________________________________________   PROCEDURES  Procedure(s) performed (including Critical Care):  Procedures   ____________________________________________   INITIAL IMPRESSION / ASSESSMENT AND PLAN / ED COURSE  Felis Quillin Linsey was evaluated in Emergency Department on 10/04/2019 for the symptoms described in the history of present illness. He was evaluated in the context of the global COVID-19 pandemic, which necessitated consideration that the patient might be at risk for infection with the SARS-CoV-2 virus that causes COVID-19. Institutional protocols and algorithms that pertain to the evaluation of patients at risk for COVID-19 are in a state of rapid change based on information released by regulatory bodies including the CDC and federal and state organizations. These policies and algorithms were followed during the  patient's care in the ED.    Pt is without any acute medical complaints except for his right knee pain has been chronic in nature and already had a thorough work-up with no signs of septic joint or other complications today  No exam findings to suggest medical cause of current presentation. Will order psychiatric screening  labs and discuss further w/ psychiatric service.  Given the concern for Dr. Holley Raring that patient would require psychiatric evaluation I think it would be best to IVC patient given the concerns for his hallucinations, not taking meds and having missed 2 weeks of dialysis this poses a danger to the patient.  D/d includes but is not limited to psychiatric disease, behavioral/personality disorder, inadequate socioeconomic support, medical.  Based on HPI, exam, unremarkable labs, no concern for acute medical problem at this time. No rigidity, clonus, hyperthermia, focal neurologic deficit, diaphoresis, tachycardia, meningismus, ataxia, gait abnormality or other finding to suggest this visit represents a non-psychiatric problem. Screening labs reviewed.    Given this, pt medically cleared, to be dispositioned per Psych.   The patient has been placed in psychiatric observation due to the need to provide a safe environment for the patient while obtaining psychiatric consultation and evaluation, as well as ongoing medical and medication management to treat the patient's condition.  The patient has been placed under full IVC at this time.      ____________________________________________   FINAL CLINICAL IMPRESSION(S) / ED DIAGNOSES   Final diagnoses:  Hallucinations      MEDICATIONS GIVEN DURING THIS VISIT:  Medications  acetaminophen (TYLENOL) tablet 1,000 mg (1,000 mg Oral Given 10/04/19 1749)     ED Discharge Orders    None       Note:  This document was prepared using Dragon voice recognition software and may include unintentional dictation errors.   Vanessa Troup, MD 10/04/19 [phone]

## 2019-10-04 NOTE — ED Notes (Signed)

## 2019-10-04 NOTE — Tx Team (Signed)
Initial Treatment Plan 10/04/2019 11:54 PM Todd Brown [id number]    PATIENT STRESSORS: Health problems Medication change or noncompliance   PATIENT STRENGTHS: Capable of independent living General fund of knowledge   PATIENT IDENTIFIED PROBLEMS:     psychosis  Non-compliance               DISCHARGE CRITERIA:  Ability to meet basic life and health needs Adequate post-discharge living arrangements Improved stabilization in mood, thinking, and/or behavior Medical problems require only outpatient monitoring Motivation to continue treatment in a less acute level of care Need for constant or close observation no longer present Reduction of life-threatening or endangering symptoms to within safe limits Safe-care adequate arrangements made Verbal commitment to aftercare and medication compliance  PRELIMINARY DISCHARGE PLAN: Outpatient therapy Return to previous living arrangement  PATIENT/FAMILY INVOLVEMENT: This treatment plan has been presented to and reviewed with the patient, Todd Brown, and/or family member.  The patient and family have been given the opportunity to ask questions and make suggestions.  Libby Maw, RN 10/04/2019, 11:54 PM

## 2019-10-04 NOTE — ED Notes (Signed)
IVC with all papers on chart and accounted for, awaiting TTS/PSYCH consult

## 2019-10-05 ENCOUNTER — Encounter: Payer: Self-pay | Admitting: Behavioral Health

## 2019-10-05 DIAGNOSIS — F203 Undifferentiated schizophrenia: Secondary | ICD-10-CM

## 2019-10-05 LAB — CARBAMAZEPINE LEVEL, TOTAL: Carbamazepine Lvl: 4.7 ug/mL (ref 4.0–12.0)

## 2019-10-05 MED ORDER — DOCUSATE SODIUM 100 MG PO CAPS
100.0000 mg | ORAL_CAPSULE | Freq: Two times a day (BID) | ORAL | Status: DC
  Administered 2019-10-05 – 2019-10-22 (×34): 100 mg via ORAL
  Filled 2019-10-05 (×34): qty 1

## 2019-10-05 MED ORDER — CHLORHEXIDINE GLUCONATE CLOTH 2 % EX PADS
6.0000 | MEDICATED_PAD | Freq: Every day | CUTANEOUS | Status: DC
  Administered 2019-10-16 – 2019-10-17 (×2): 6 via TOPICAL

## 2019-10-05 MED ORDER — AMLODIPINE BESYLATE 5 MG PO TABS
10.0000 mg | ORAL_TABLET | Freq: Every day | ORAL | Status: DC
  Administered 2019-10-05 – 2019-10-22 (×15): 10 mg via ORAL
  Filled 2019-10-05 (×17): qty 2

## 2019-10-05 MED ORDER — TEMAZEPAM 15 MG PO CAPS
15.0000 mg | ORAL_CAPSULE | Freq: Every day | ORAL | Status: DC
  Administered 2019-10-05 – 2019-10-22 (×18): 15 mg via ORAL
  Filled 2019-10-05 (×18): qty 1

## 2019-10-05 MED ORDER — SENNA 8.6 MG PO TABS
2.0000 | ORAL_TABLET | Freq: Every day | ORAL | Status: DC | PRN
  Administered 2019-10-13 – 2019-10-16 (×2): 17.2 mg via ORAL
  Filled 2019-10-05 (×3): qty 2

## 2019-10-05 MED ORDER — NICOTINE 21 MG/24HR TD PT24
21.0000 mg | MEDICATED_PATCH | Freq: Every day | TRANSDERMAL | Status: DC
  Administered 2019-10-05 – 2019-10-22 (×18): 21 mg via TRANSDERMAL
  Filled 2019-10-05 (×18): qty 1

## 2019-10-05 NOTE — Progress Notes (Signed)
F- Monitor medical concerns.  D - Todd Brown was pleasant and cooperative, accepting medications as ordered.  Adequate food and fluids consumed.  The patient denied thoughts of harming himself and others.  No evidence of psychosis observed.  [name] attended groups and advocated for himself.  No bizarre or unsafe behaviors noted.  Interactions with peers were appropriate.  A - The patient is a high fall risk due to age, physical health, eyesight, and medication changes.  Dialysis scheduled for tomorrow morning.    R - Continue with care.  Prepare the patient for dialysis.

## 2019-10-05 NOTE — Plan of Care (Signed)
Patient denies SI / HI / AVH. Patient is very difficult to understand and patient gets frustrated with repeating self. Patient is appropriate and has a minor understanding of admission. Patient states " I am here to get well, the people at the dialysis said I was acting some kind of way." Patient denies any anxiety or depression at this time. Patient has Left arm fistula for renal access. Site is assessed. Thrill and bruit noted. Patient's safety is maintained on the unit.    Problem: Education: Goal: Knowledge of [place] [facility] Education information/materials will improve Outcome: Not Progressing Goal: Emotional status will improve Outcome: Not Progressing Goal: Mental status will improve Outcome: Not Progressing Goal: Verbalization of understanding the information provided will improve Outcome: Not Progressing

## 2019-10-05 NOTE — Progress Notes (Signed)
Recreation Therapy Notes  Date: 10/05/2019  Time: 9:30 am   Location: Room 21   Behavioral response: N/A   Intervention Topic: Animal Assisted therapy    Discussion/Intervention: Patient did not attend group.   Clinical Observations/Feedback:  Patient did not attend group.   Brittanny Levenhagen LRT/CTRS          Henna Derderian 10/05/2019 12:54 PM

## 2019-10-05 NOTE — Tx Team (Addendum)
Interdisciplinary Treatment and Diagnostic Plan Update  10/05/2019 Time of Session: [place] MRN: [id number]  Principal Diagnosis: <principal problem not specified>  Secondary Diagnoses: Active Problems:   Schizophrenia ([place])   Current Medications:  Current Facility-Administered Medications  Medication Dose Route Frequency Provider Last Rate Last Admin  . acetaminophen (TYLENOL) tablet 500 mg  500 mg Oral Q4H PRN Caroline Sauger, NP   500 mg at 10/05/19 8099  . albuterol (PROVENTIL) (2.5 MG/3ML) 0.083% nebulizer solution 2.5 mg  2.5 mg Inhalation Q4H PRN Caroline Sauger, NP   2.5 mg at 10/05/19 8338  . calcium acetate (PHOSLO) capsule 1,334 mg  1,334 mg Oral TID WC Caroline Sauger, NP   1,334 mg at 10/05/19 0820  . carbamazepine (TEGRETOL XR) 12 hr tablet 1,000 mg  1,000 mg Oral Chester Holstein, Geni Bers, NP   1,000 mg at 10/05/19 2505  . cholecalciferol (VITAMIN D3) tablet 1,000 Units  1,000 Units Oral Daily Caroline Sauger, NP   1,000 Units at 10/05/19 [phone]  . ferrous sulfate tablet 325 mg  325 mg Oral Daily Caroline Sauger, NP   325 mg at 10/05/19 0825  . fluPHENAZine (PROLIXIN) tablet 2.5 mg  2.5 mg Oral BID Caroline Sauger, NP   2.5 mg at 10/05/19 7341  . mometasone-formoterol (DULERA) 200-5 MCG/ACT inhaler 2 puff  2 puff Inhalation BID Caroline Sauger, NP   2 puff at 10/05/19 0825  . multivitamin (RENA-VIT) tablet 1 tablet  1 tablet Oral Daily Caroline Sauger, NP   1 tablet at 10/05/19 [phone]  . nicotine (NICODERM CQ - dosed in mg/24 hours) patch 21 mg  21 mg Transdermal Daily Clapacs, Madie Reno, MD   21 mg at 10/05/19 1003  . pantoprazole (PROTONIX) EC tablet 40 mg  40 mg Oral Daily Caroline Sauger, NP   40 mg at 10/05/19 0240  . trihexyphenidyl (ARTANE) tablet 2 mg  2 mg Oral BID Caroline Sauger, NP   2 mg at 10/05/19 9735   PTA Medications: Medications Prior to Admission  Medication Sig Dispense Refill Last Dose  .  acetaminophen (TYLENOL) 500 MG tablet Take 500 mg by mouth every 4 (four) hours as needed for mild pain or moderate pain.      Marland Kitchen albuterol (PROAIR HFA) 108 (90 Base) MCG/ACT inhaler Inhale 1-2 puffs into the lungs every 4 (four) hours as needed for wheezing or shortness of breath.      Marland Kitchen amLODipine (NORVASC) 10 MG tablet Take 1 tablet (10 mg total) by mouth daily.     . calcium acetate (PHOSLO) 667 MG capsule Take 2 capsules (1,334 mg total) by mouth 3 (three) times daily with meals. 60 capsule 1   . carbamazepine (TEGRETOL XR) 200 MG 12 hr tablet Take 1,000 mg by mouth every morning.     . celecoxib (CELEBREX) 200 MG capsule Take 200 mg by mouth daily as needed for mild pain.     . cholecalciferol (VITAMIN D) 1000 UNITS tablet Take 1,000 Units by mouth daily.     . cloNIDine (CATAPRES) 0.3 MG tablet Take 0.3 mg by mouth 3 (three) times daily.     . ferrous sulfate 325 (65 FE) MG tablet Take 1 tablet (325 mg total) by mouth daily. 30 tablet 3   . fluPHENAZine (PROLIXIN) 2.5 MG tablet Take 2.5 mg by mouth 2 (two) times daily.      . Fluticasone-Salmeterol (ADVAIR) 500-50 MCG/DOSE AEPB Inhale 1 puff into the lungs 2 (two) times daily.     . hydrALAZINE (  APRESOLINE) 100 MG tablet Take 1 tablet (100 mg total) by mouth 2 (two) times daily.     . minoxidil (LONITEN) 10 MG tablet Take 10 mg by mouth 2 (two) times daily.     . multivitamin (RENA-VIT) TABS tablet Take 1 tablet by mouth daily.     Marland Kitchen omeprazole (PRILOSEC) 40 MG capsule Take 1 capsule (40 mg total) by mouth daily. 30 capsule 1   . oxyCODONE (ROXICODONE) 5 MG immediate release tablet Take 1 tablet (5 mg total) by mouth every 8 (eight) hours as needed. 10 tablet 0   . trihexyphenidyl (ARTANE) 2 MG tablet Take 2 mg by mouth 2 (two) times daily.       Patient Stressors: Health problems Medication change or noncompliance  Patient Strengths: Capable of independent living General fund of knowledge  Treatment Modalities: Medication Management,  Group therapy, Case management,  1 to 1 session with clinician, Psychoeducation, Recreational therapy.   Physician Treatment Plan for Primary Diagnosis: <principal problem not specified> Long Term Goal(s):     Short Term Goals:    Medication Management: Evaluate patient's response, side effects, and tolerance of medication regimen.  Therapeutic Interventions: 1 to 1 sessions, Unit Group sessions and Medication administration.  Evaluation of Outcomes: Not Met  Physician Treatment Plan for Secondary Diagnosis: Active Problems:   Schizophrenia ([place])  Long Term Goal(s):     Short Term Goals:       Medication Management: Evaluate patient's response, side effects, and tolerance of medication regimen.  Therapeutic Interventions: 1 to 1 sessions, Unit Group sessions and Medication administration.  Evaluation of Outcomes: Not Met   RN Treatment Plan for Primary Diagnosis: <principal problem not specified> Long Term Goal(s): Knowledge of disease and therapeutic regimen to maintain health will improve  Short Term Goals: Ability to identify and develop effective coping behaviors will improve and Compliance with prescribed medications will improve  Medication Management: RN will administer medications as ordered by provider, will assess and evaluate patient's response and provide education to patient for prescribed medication. RN will report any adverse and/or side effects to prescribing provider.  Therapeutic Interventions: 1 on 1 counseling sessions, Psychoeducation, Medication administration, Evaluate responses to treatment, Monitor vital signs and CBGs as ordered, Perform/monitor CIWA, COWS, AIMS and Fall Risk screenings as ordered, Perform wound care treatments as ordered.  Evaluation of Outcomes: Not Met   LCSW Treatment Plan for Primary Diagnosis: <principal problem not specified> Long Term Goal(s): Safe transition to appropriate next level of care at discharge, Engage patient in  therapeutic group addressing interpersonal concerns.  Short Term Goals: Engage patient in aftercare planning with referrals and resources  Therapeutic Interventions: Assess for all discharge needs, 1 to 1 time with Social worker, Explore available resources and support systems, Assess for adequacy in community support network, Educate family and significant other(s) on suicide prevention, Complete Psychosocial Assessment, Interpersonal group therapy.  Evaluation of Outcomes: Not Met   Progress in Treatment: Attending groups: No. Participating in groups: No. Taking medication as prescribed: Yes. Toleration medication: Yes. Family/Significant other contact made: No, will contact:  when pt gives consent Patient understands diagnosis: No. Discussing patient identified problems/goals with staff: Yes. Medical problems stabilized or resolved: No. Denies suicidal/homicidal ideation: Unknown Issues/concerns per patient self-inventory: No. Other: NA  New problem(s) identified: No, Describe:  None reported  New Short Term/Long Term Goal(s):Attend outpatient treatment, develop and implement healthy coping methods, take medication as prescribed.  Patient Goals:  "I dont have a goal"  Discharge Plan or  Barriers: Pt speaks gibberish, difficult to understand what he is saying. However, pt [facility]. DC plan TBD.  Reason for Continuation of Hospitalization: Medication stabilization  Estimated Length of Stay:1-7 days   Recreational Therapy: Patient Stressors: N/A Patient Goal: Patient will engage in groups without prompting or encouragement from LRT x3 group sessions within 5 recreation therapy group sessions  Attendees: Patient:Todd Brown 10/05/2019 10:12 AM  Physician: Alethia Berthold 10/05/2019 10:12 AM  Nursing: Alvis Lemmings, nurse 10/05/2019 10:12 AM  RN Care Manager: 10/05/2019 10:12 AM  Social Worker: Anise Salvo 10/05/2019 10:12 AM   Recreational Therapist: Isaias Sakai Douglas Smolinsky 10/05/2019 10:12 AM  Other:  10/05/2019 10:12 AM  Other:  10/05/2019 10:12 AM  Other: 10/05/2019 10:12 AM    Scribe for Treatment Team: Yvette Rack, LCSW 10/05/2019 10:12 AM

## 2019-10-05 NOTE — BHH Counselor (Signed)
CSW attempted to meet with pt to complete PSA but was unsuccessful. Pt displays gibberish speech and disorganized thinking.

## 2019-10-05 NOTE — Plan of Care (Signed)
The patient is pleasant and cooperative.  Problem: Education: Goal: Verbalization of understanding the information provided will improve Outcome: Progressing   Problem: Coping: Goal: Ability to verbalize frustrations and anger appropriately will improve Outcome: Progressing

## 2019-10-05 NOTE — Progress Notes (Signed)
[facility], [place] 10/05/19  Subjective:   LOS: 1   Last HD was yesterday Patient known to [place] from outpatient dialysis Was sent for behavioral issues during dialysis and verbal out bursts Denies any acute complaints today.   + leg edema   HEMODIALYSIS FLOWSHEET:  Blood Flow Rate (mL/min): 400 mL/min Arterial Pressure (mmHg): -140 mmHg Venous Pressure (mmHg): 230 mmHg Transmembrane Pressure (mmHg): 60 mmHg Ultrafiltration Rate (mL/min): 860 mL/min Dialysate Flow Rate (mL/min): 800 ml/min Conductivity: Machine : 14 Conductivity: Machine : 14 Dialysis Fluid Bolus: Normal Saline Bolus Amount (mL): 250 mL    Objective:  Vital signs in last 24 hours:  Temp:  [98 F (36.7 C)-98.8 F (37.1 C)] 98 F (36.7 C) (05/05 2346) Pulse Rate:  [75-80] 80 (05/05 2346) Resp:  [16-18] 18 (05/05 2346) BP: (114-180)/(89-98) 160/90 (05/05 2346) SpO2:  [93 %-96 %] 95 % (05/06 0633) Weight:  [69.4 kg-81.6 kg] 69.4 kg (05/05 2346)  Weight change:  Filed Weights   10/04/19 2346  Weight: 69.4 kg    Intake/Output:   No intake or output data in the 24 hours ending 10/05/19 1040   Physical Exam: General:  No acute distress, sitting up   HEENT  anicteric, moist oral mucous membrane  Pulm/lungs  normal breathing effort, clear to auscultation, [place] O2  CVS/Heart  regular, no rub  Abdomen:   Soft, nontender  Extremities:  + peripheral edema  Neurologic:  Alert, able to answer questions  Skin:  No acute rash  Access:  AV fistula       Basic Metabolic Panel:  Recent Labs  Lab 10/04/19 1545  NA 136  K 3.3*  CL 99  CO2 27  GLUCOSE 86  BUN 26*  CREATININE 4.45*  CALCIUM 9.2     CBC: Recent Labs  Lab 10/04/19 1545  WBC 8.0  HGB 11.5*  HCT 35.0*  MCV 100.6*  PLT 269      Lab Results  Component Value Date   HEPBSAG Negative 05/05/2017   HEPBSAB Reactive 05/26/2016      Microbiology:  Recent Results (from the past 240 hour(s))   Respiratory Panel by RT PCR (Flu A&B, Covid) - Nasopharyngeal Swab     Status: None   Collection Time: 10/04/19  6:26 PM   Specimen: Nasopharyngeal Swab  Result Value Ref Range Status   SARS Coronavirus 2 by RT PCR NEGATIVE NEGATIVE Final    Comment: (NOTE) SARS-CoV-2 target nucleic acids are NOT DETECTED. The SARS-CoV-2 RNA is generally detectable in upper respiratoy specimens during the acute phase of infection. The lowest concentration of SARS-CoV-2 viral copies this assay can detect is 131 copies/mL. A negative result does not preclude SARS-Cov-2 infection and should not be used as the sole basis for treatment or other patient management decisions. A negative result may occur with  improper specimen collection/handling, submission of specimen other than nasopharyngeal swab, presence of viral mutation(s) within the areas targeted by this assay, and inadequate number of viral copies (<131 copies/mL). A negative result must be combined with clinical observations, patient history, and epidemiological information. The expected result is Negative. Fact Sheet for Patients:  PinkCheek.be Fact Sheet for Healthcare Providers:  GravelBags.it This test is not yet ap proved or cleared by the [place] FDA and  has been authorized for detection and/or diagnosis of SARS-CoV-2 by FDA under an Emergency Use Authorization (EUA). This EUA will remain  in effect (meaning this test can be used) for the duration of the  COVID-19 declaration under Section 564(b)(1) of the Act, 21 U.S.C. section 360bbb-3(b)(1), unless the authorization is terminated or revoked sooner.    Influenza A by PCR NEGATIVE NEGATIVE Final   Influenza B by PCR NEGATIVE NEGATIVE Final    Comment: (NOTE) The Xpert Xpress SARS-CoV-2/FLU/RSV assay is intended as an aid in  the diagnosis of influenza from Nasopharyngeal swab specimens and  should not be used as a sole  basis for treatment. Nasal washings and  aspirates are unacceptable for Xpert Xpress SARS-CoV-2/FLU/RSV  testing. Fact Sheet for Patients: PinkCheek.be Fact Sheet for Healthcare Providers: GravelBags.it This test is not yet approved or cleared by the [place] FDA and  has been authorized for detection and/or diagnosis of SARS-CoV-2 by  FDA under an Emergency Use Authorization (EUA). This EUA will remain  in effect (meaning this test can be used) for the duration of the  Covid-19 declaration under Section 564(b)(1) of the Act, 21  U.S.C. section 360bbb-3(b)(1), unless the authorization is  terminated or revoked. Performed at [facility], [address] [place], [place] [address]     Coagulation Studies: No results for input(s): LABPROT, INR in the last 72 hours.  Urinalysis: No results for input(s): COLORURINE, LABSPEC, PHURINE, GLUCOSEU, HGBUR, BILIRUBINUR, KETONESUR, PROTEINUR, UROBILINOGEN, NITRITE, LEUKOCYTESUR in the last 72 hours.  Invalid input(s): APPERANCEUR    Imaging: No results found.   Medications:    . calcium acetate  1,334 mg Oral TID WC  . carbamazepine  1,000 mg Oral [id number]  . cholecalciferol  1,000 Units Oral Daily  . ferrous sulfate  325 mg Oral Daily  . fluPHENAZine  2.5 mg Oral BID  . mometasone-formoterol  2 puff Inhalation BID  . multivitamin  1 tablet Oral Daily  . nicotine  21 mg Transdermal Daily  . pantoprazole  40 mg Oral Daily  . trihexyphenidyl  2 mg Oral BID   acetaminophen, albuterol  Assessment/ Plan:  59 y.o. male with   End-stage renal disease on hemodialysis  Hypertension  Schizophrenia  Hepatitis C  history of DVT   was admitted on 10/04/2019 for  Active Problems:   Schizophrenia ([place])  Schizophrenia ([place]) [F20.9]  [place] Left AVF  #. ESRD with LE edema Outpatient dry weight 73.5 kg We will arrange for hemodialysis while  inpatient MWF schedule Patient seen during dialysis Tolerating well UF goal ~ 3-3.5 L as tolerated   #. Anemia of CKD  Lab Results  Component Value Date   HGB 11.5 (L) 10/04/2019   Low dose EPO with HD for hemoglobin less than 11  #. Secondary hyperparathyroidism of renal origin N 25.81     Component Value Date/Time   PTH 194 (H) 05/11/2017 0959   PTH Comment 10/22/2014 0504   Lab Results  Component Value Date   PHOS 3.8 09/13/2018   Monitor calcium and phos level during this admission    LOS: [place] 5/6/202110:40 AM  [facility] [place], [place]

## 2019-10-05 NOTE — BHH Suicide Risk Assessment (Signed)
[facility] Admission Suicide Risk Assessment   Nursing information obtained from:  Patient Demographic factors:  NA Current Mental Status:  NA Loss Factors:  NA Historical Factors:  NA Risk Reduction Factors:  NA  Total Time spent with patient: 1 hour Principal Problem: Schizophrenia ([place]) Diagnosis:  Principal Problem:   Schizophrenia ([place]) Active Problems:   Essential hypertension, benign   Chronic kidney disease   Hepatitis C   End stage renal disease ([place])   Dialysis patient ([place])  Subjective Data: Patient seen and chart reviewed. 59 year old man with a history of schizophrenia who has multiple medical problems most obviously being on dialysis. He was referred for psychiatric evaluation by the dialysis team out of concern for poor self-care. They report he had missed 2 weeks of dialysis. Patient himself denies suicidal or homicidal thought. He denies having missed 2 weeks of dialysis but he is a very poor historian. He does present as being cooperative with treatment.  Continued Clinical Symptoms:  Alcohol Use Disorder Identification Test Final Score (AUDIT): 0 The "Alcohol Use Disorders Identification Test", Guidelines for Use in Primary Care, Second Edition.  World Pharmacologist [facility]). Score between 0-7:  no or low risk or alcohol related problems. Score between 8-15:  moderate risk of alcohol related problems. Score between 16-19:  high risk of alcohol related problems. Score 20 or above:  warrants further diagnostic evaluation for alcohol dependence and treatment.   CLINICAL FACTORS:   Schizophrenia:   Paranoid or undifferentiated type   Musculoskeletal: Strength & Muscle Tone: within normal limits Gait & Station: unsteady Patient leans: N/A  Psychiatric Specialty Exam: Physical Exam  Nursing note and vitals reviewed. Constitutional: He appears well-developed.  HENT:  Head: Normocephalic and atraumatic.  Eyes: Pupils are equal, round, and reactive to light.  Conjunctivae are normal.  Cardiovascular: Regular rhythm and normal heart sounds.  Respiratory: Effort normal.  GI: Soft.  Musculoskeletal:        General: Normal range of motion.     Cervical back: Normal range of motion.  Neurological: He is alert.  Skin: Skin is warm and dry.  Psychiatric: His affect is blunt. His speech is rapid and/or pressured and tangential. He is agitated. He is not aggressive. Cognition and memory are impaired. He expresses impulsivity. He expresses no homicidal and no suicidal ideation.    Review of Systems  Constitutional: Positive for appetite change and fatigue.  HENT: Negative.   Eyes: Negative.   Respiratory: Negative.   Cardiovascular: Negative.   Gastrointestinal: Negative.   Musculoskeletal: Negative.   Skin: Negative.   Neurological: Negative.   Psychiatric/Behavioral: Negative for hallucinations and suicidal ideas.    Blood pressure (!) 160/90, pulse 80, temperature 98 F (36.7 C), temperature source Oral, resp. rate 18, height 6\' 3"  (1.905 m), weight 69.4 kg, SpO2 95 %.Body mass index is 19.12 kg/m.  General Appearance: Casual  Eye Contact:  Fair  Speech:  Garbled and Pressured  Volume:  Increased  Mood:  Dysphoric and Irritable  Affect:  Constricted  Thought Process:  Disorganized  Orientation:  Negative  Thought Content:  Illogical, Rumination and Tangential  Suicidal Thoughts:  No  Homicidal Thoughts:  No  Memory:  Immediate;   Fair Recent;   Poor Remote;   Poor  Judgement:  Impaired  Insight:  Shallow  Psychomotor Activity:  Restlessness and TD  Concentration:  Concentration: Poor  Recall:  Poor  Fund of Knowledge:  Fair  Language:  Fair  Akathisia:  No  Handed:  Right  AIMS (if indicated):     Assets:  Desire for Improvement Financial Resources/Insurance Housing Social Support  ADL's:  Impaired  Cognition:  Impaired,  Mild and Moderate  Sleep:  Number of Hours: 4      COGNITIVE FEATURES THAT CONTRIBUTE TO RISK:   Thought constriction (tunnel vision)    SUICIDE RISK:   Minimal: No identifiable suicidal ideation.  Patients presenting with no risk factors but with morbid ruminations; may be classified as minimal risk based on the severity of the depressive symptoms  PLAN OF CARE: Medical problems will be managed. We will work on trying to form rapport and get more information including trying to get collateral information from his act team. For now continue current psychiatric medicine. Nephrology is aware of the patient and will continue his usual dialysis. 15-minute checks will be continued. No sign of acute suicidal dangerousness  I certify that inpatient services furnished can reasonably be expected to improve the patient's condition.   Alethia Berthold, MD 10/05/2019, 5:12 PM

## 2019-10-05 NOTE — H&P (Signed)
Psychiatric Admission Assessment Adult  Patient Identification: Todd Brown MRN:  [id number] Date of Evaluation:  10/05/2019 Chief Complaint:  Schizophrenia ([place] ) [F20.9] Principal Diagnosis: Schizophrenia ([place]) Diagnosis:  Principal Problem:   Schizophrenia ([place]) Active Problems:   Essential hypertension, benign   Chronic kidney disease   Hepatitis C   End stage renal disease ([place])   Dialysis patient ([place])  History of Present Illness: Patient seen chart reviewed. Any discussion of the patient needs to be prefaced that the patient himself is a very difficult historian. A great deal of his speech is garbled to the point that it is difficult to understand and at the same time he is pressured and tangential in his thinking. Patient was referred to the emergency room by his dialysis team out of concern for poor self-care. They report that he had missed 2 weeks of dialysis sessions and appeared to be agitated and aggressive. They describe him as being "violent". I asked the patient about this and he denied anything that he would say is violent but he admitted that his mood has been irritable and that he has been having temper problems. He can't put an exact time course on this. He denies having missed 2 weeks of dialysis but his memory is poor and again I think he is not to be trusted on that. Some collateral information from his act team is that they have been increasingly concerned about his physical health. Apparently they have seen him looking like he is in worse health, not eating well but they also describe some anger issues such as seeing him banging his head out of frustration. I asked the patient about that in particular and he admitted to it but couldn't tell me why he was feeling angry. He does tell me that he has not been eating well. He seems to be having a lot of physical discomfort but it was very difficult to specifically nail it down. I think he is trying to tell me that he has been  constipated although he didn't seem to know that specific word. He talks about his stomach hurting. He tells me several times that he is just "not feeling good". When I ask him in what way and give him several options we can't really get anything specific other than vague abdominal complaints. His lab studies on admission are pretty unremarkable for somebody on dialysis. He tells me that he has been compliant with his medicine at home although it sounds like he lives by himself and the act team only comes by once a week so there is no way to be absolutely certain about that. He wasn't able to name any of his medicines for me. Patient did tell me that he knew that he had to get dialysis or else he would die but he also told me that he disliked getting dialysis that it made him feel sick and that he got tired of it. He denied however having any suicidal thoughts or wish to die. He denied auditory or visual hallucinations. Patient does have an act team [facility] who have been seeing him for years. Evidently he is still on Tegretol and Prolixin orally which has been his medicine for years. Associated Signs/Symptoms: Depression Symptoms:  anhedonia, psychomotor agitation, fatigue, difficulty concentrating, anxiety, weight loss, (Hypo) Manic Symptoms:  Impulsivity, Irritable Mood, Anxiety Symptoms:  Couldn't really be specific Psychotic Symptoms:  Patient denies hallucinations. I can't be certain if he is not having any other psychotic symptoms because as mentioned above  he is such a difficult historian and I do not understand the majority of what he is trying to say. PTSD Symptoms: Negative Total Time spent with patient: 1 hour  Past Psychiatric History: Patient carries a diagnosis of schizophrenia. The only prior psychiatric note we have is from me. I saw him in the emergency room about 5 years ago when he was just starting dialysis. At that time he was also impaired in his ability to give a history but told  me that he had had hospitalizations in the past but denied suicidality.  Is the patient at risk to self? Yes.    Has the patient been a risk to self in the past 6 months? No.  Has the patient been a risk to self within the distant past? Yes.    Is the patient a risk to others? No.  Has the patient been a risk to others in the past 6 months? No.  Has the patient been a risk to others within the distant past? No.   Prior Inpatient Therapy:   Prior Outpatient Therapy:    Alcohol Screening: 1. How often do you have a drink containing alcohol?: Never 2. How many drinks containing alcohol do you have on a typical day when you are drinking?: 1 or 2 3. How often do you have six or more drinks on one occasion?: Never AUDIT-C Score: 0 4. How often during the last year have you found that you were not able to stop drinking once you had started?: Never 5. How often during the last year have you failed to do what was normally expected from you becasue of drinking?: Never 6. How often during the last year have you needed a first drink in the morning to get yourself going after a heavy drinking session?: Never 7. How often during the last year have you had a feeling of guilt of remorse after drinking?: Never 8. How often during the last year have you been unable to remember what happened the night before because you had been drinking?: Never 9. Have you or someone else been injured as a result of your drinking?: No 10. Has a relative or friend or a doctor or another health worker been concerned about your drinking or suggested you cut down?: No Alcohol Use Disorder Identification Test Final Score (AUDIT): 0 Alcohol Brief Interventions/Follow-up: AUDIT Score <7 follow-up not indicated Substance Abuse History in the last 12 months:  No. Consequences of Substance Abuse: Negative Previous Psychotropic Medications: Yes  Psychological Evaluations: Yes  Past Medical History:  Past Medical History:   Diagnosis Date  . Anemia   . Diabetes mellitus without complication ([place])   . Dialysis patient [facility])    Mon. -Wed.- Fri  . 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 ESRD (end stage renal disease) ([place])   . Hyperlipidemia   . Malignant hypertension   . Renal artery stenosis ([place])   . Schizophrenia [facility])     Past Surgical History:  Procedure Laterality Date  . A/V FISTULAGRAM N/A 08/06/2016   Procedure: A/V Fistulagram;  Surgeon: Algernon Huxley, MD;  Location: [place] [facility];  Service: Cardiovascular;  Laterality: N/A;  . A/V SHUNT INTERVENTION N/A 08/06/2016   Procedure: A/V Shunt Intervention;  Surgeon: Algernon Huxley, MD;  Location: [place] [facility];  Service: Cardiovascular;  Laterality: N/A;  . AV FISTULA PLACEMENT Left 12/26/2014   Procedure: ARTERIOVENOUS (AV) FISTULA CREATION;  Surgeon: Corene Cornea  Bunnie Domino, MD;  Location: [facility] ORS;  Service: Vascular;  Laterality: Left;  . ESOPHAGOGASTRODUODENOSCOPY (EGD) WITH PROPOFOL N/A 05/06/2017   Procedure: ESOPHAGOGASTRODUODENOSCOPY (EGD) WITH PROPOFOL;  Surgeon: Jonathon Bellows, MD;  Location: [facility] END[facility];  Service: Gastroenterology;  Laterality: N/A;  . INSERTION OF DIALYSIS CATHETER Right   . PERIPHERAL VASCULAR CATHETERIZATION N/A 10/22/2014   Procedure: Dialysis/Perma Catheter Insertion;  Surgeon: Algernon Huxley, MD;  Location: [place] [facility];  Service: Cardiovascular;  Laterality: N/A;  . PERIPHERAL VASCULAR CATHETERIZATION N/A 02/28/2015   Procedure: Dialysis/Perma Catheter Removal;  Surgeon: Algernon Huxley, MD;  Location: [place] [facility];  Service: Cardiovascular;  Laterality: N/A;  . Repair fx left lower leg     yrs ago (age 11)   Family History:  Family History  Problem Relation Age of Onset  . Diabetes Neg Hx    Family Psychiatric  History: Does not know of any Tobacco Screening:   Social History:  Social History   Substance and Sexual Activity  Alcohol Use No     Social History    Substance and Sexual Activity  Drug Use Yes  . Frequency: 1.0 times per week  . Types: Marijuana    Additional Social History:                           Allergies:   Allergies  Allergen Reactions  . Chlorpromazine Other (See Comments)    Reaction:  Unknown , pt states it makes him feel real bad Reaction:  Unknown , pt states it makes him feel real bad    Lab Results:  Results for orders placed or performed during the hospital encounter of 10/04/19 (from the past 48 hour(s))  Carbamazepine level, total     Status: None   Collection Time: 10/04/19  3:45 PM  Result Value Ref Range   Carbamazepine Lvl 4.7 4.0 - 12.0 ug/mL    Comment: Performed at [facility], [address] [place], [place] [address]    Blood Alcohol level:  Lab Results  Component Value Date   [facility] <10 10/04/2019   ETH <5 02/77/4128    Metabolic Disorder Labs:  Lab Results  Component Value Date   HGBA1C 5.0 09/12/2018   MPG 97 09/12/2018   No results found for: PROLACTIN Lab Results  Component Value Date   TRIG 106 08/03/2016    Current Medications: Current Facility-Administered Medications  Medication Dose Route Frequency Provider Last Rate Last Admin  . acetaminophen (TYLENOL) tablet 500 mg  500 mg Oral Q4H PRN Caroline Sauger, NP   500 mg at 10/05/19 1435  . albuterol (PROVENTIL) (2.5 MG/3ML) 0.083% nebulizer solution 2.5 mg  2.5 mg Inhalation Q4H PRN Caroline Sauger, NP   2.5 mg at 10/05/19 7867  . amLODipine (NORVASC) tablet 10 mg  10 mg Oral Daily Rashaun Curl T, MD      . calcium acetate (PHOSLO) capsule 1,334 mg  1,334 mg Oral TID WC Caroline Sauger, NP   1,334 mg at 10/05/19 1120  . carbamazepine (TEGRETOL XR) 12 hr tablet 1,000 mg  1,000 mg Oral Chester Holstein, Geni Bers, NP   1,000 mg at 10/05/19 6720  . [START ON 10/06/2019] Chlorhexidine Gluconate Cloth 2 % PADS 6 each  6 each Topical Q0600 Murlean Iba, MD      . cholecalciferol (VITAMIN D3)  tablet 1,000 Units  1,000 Units Oral Daily Caroline Sauger, NP   1,000 Units at 10/05/19 [phone]  .  docusate sodium (COLACE) capsule 100 mg  100 mg Oral BID Grey Schlauch T, MD      . ferrous sulfate tablet 325 mg  325 mg Oral Daily Caroline Sauger, NP   325 mg at 10/05/19 0825  . fluPHENAZine (PROLIXIN) tablet 2.5 mg  2.5 mg Oral BID Caroline Sauger, NP   2.5 mg at 10/05/19 5009  . mometasone-formoterol (DULERA) 200-5 MCG/ACT inhaler 2 puff  2 puff Inhalation BID Caroline Sauger, NP   2 puff at 10/05/19 0825  . multivitamin (RENA-VIT) tablet 1 tablet  1 tablet Oral Daily Caroline Sauger, NP   1 tablet at 10/05/19 [phone]  . nicotine (NICODERM CQ - dosed in mg/24 hours) patch 21 mg  21 mg Transdermal Daily Dezhane Staten, Madie Reno, MD   21 mg at 10/05/19 1003  . pantoprazole (PROTONIX) EC tablet 40 mg  40 mg Oral Daily Caroline Sauger, NP   40 mg at 10/05/19 2993  . senna (SENOKOT) tablet 17.2 mg  2 tablet Oral Daily PRN Janeane Cozart T, MD      . temazepam (RESTORIL) capsule 15 mg  15 mg Oral QHS Mikyah Alamo T, MD      . trihexyphenidyl (ARTANE) tablet 2 mg  2 mg Oral BID Caroline Sauger, NP   2 mg at 10/05/19 7169   PTA Medications: Medications Prior to Admission  Medication Sig Dispense Refill Last Dose  . acetaminophen (TYLENOL) 500 MG tablet Take 500 mg by mouth every 4 (four) hours as needed for mild pain or moderate pain.      Marland Kitchen albuterol (PROAIR HFA) 108 (90 Base) MCG/ACT inhaler Inhale 1-2 puffs into the lungs every 4 (four) hours as needed for wheezing or shortness of breath.      Marland Kitchen amLODipine (NORVASC) 10 MG tablet Take 1 tablet (10 mg total) by mouth daily.     . calcium acetate (PHOSLO) 667 MG capsule Take 2 capsules (1,334 mg total) by mouth 3 (three) times daily with meals. 60 capsule 1   . carbamazepine (TEGRETOL XR) 200 MG 12 hr tablet Take 1,000 mg by mouth every morning.     . celecoxib (CELEBREX) 200 MG capsule Take 200 mg by mouth daily as needed for  mild pain.     . cholecalciferol (VITAMIN D) 1000 UNITS tablet Take 1,000 Units by mouth daily.     . cloNIDine (CATAPRES) 0.3 MG tablet Take 0.3 mg by mouth 3 (three) times daily.     . ferrous sulfate 325 (65 FE) MG tablet Take 1 tablet (325 mg total) by mouth daily. 30 tablet 3   . fluPHENAZine (PROLIXIN) 2.5 MG tablet Take 2.5 mg by mouth 2 (two) times daily.      . Fluticasone-Salmeterol (ADVAIR) 500-50 MCG/DOSE AEPB Inhale 1 puff into the lungs 2 (two) times daily.     . hydrALAZINE (APRESOLINE) 100 MG tablet Take 1 tablet (100 mg total) by mouth 2 (two) times daily.     . minoxidil (LONITEN) 10 MG tablet Take 10 mg by mouth 2 (two) times daily.     . multivitamin (RENA-VIT) TABS tablet Take 1 tablet by mouth daily.     Marland Kitchen omeprazole (PRILOSEC) 40 MG capsule Take 1 capsule (40 mg total) by mouth daily. 30 capsule 1   . oxyCODONE (ROXICODONE) 5 MG immediate release tablet Take 1 tablet (5 mg total) by mouth every 8 (eight) hours as needed. 10 tablet 0   . trihexyphenidyl (ARTANE) 2 MG tablet Take 2 mg by mouth 2 (  two) times daily.       Musculoskeletal: Strength & Muscle Tone: within normal limits Gait & Station: unsteady Patient leans: N/A  Psychiatric Specialty Exam: Physical Exam  Nursing note and vitals reviewed. Constitutional: He appears well-developed and well-nourished.  HENT:  Head: Normocephalic and atraumatic.  Eyes: Pupils are equal, round, and reactive to light. Conjunctivae are normal.  Cardiovascular: Regular rhythm and normal heart sounds.  Respiratory: Effort normal. No respiratory distress.  GI: Soft.  Musculoskeletal:        General: Normal range of motion.     Cervical back: Normal range of motion.  Neurological: He is alert.  Skin: Skin is warm and dry.  Psychiatric: His affect is blunt. His speech is rapid and/or pressured. His speech is not delayed. He is agitated. He is not aggressive. Cognition and memory are impaired. He expresses impulsivity. He  expresses no homicidal and no suicidal ideation.    Review of Systems  Constitutional: Positive for appetite change and fatigue.  HENT: Negative.   Eyes: Negative.   Respiratory: Negative.   Cardiovascular: Negative.   Gastrointestinal: Negative.   Musculoskeletal: Negative.   Skin: Negative.   Neurological: Negative.   Psychiatric/Behavioral: Positive for dysphoric mood.    Blood pressure (!) 160/90, pulse 80, temperature 98 F (36.7 C), temperature source Oral, resp. rate 18, height 6\' 3"  (1.905 m), weight 69.4 kg, SpO2 95 %.Body mass index is 19.12 kg/m.  General Appearance: Casual  Eye Contact:  Minimal  Speech:  Pressured  Volume:  Increased  Mood:  Irritable  Affect:  Constricted  Thought Process:  Disorganized  Orientation:  Negative  Thought Content:  Illogical, Rumination and Tangential  Suicidal Thoughts:  No  Homicidal Thoughts:  No  Memory:  Immediate;   Fair Recent;   Poor Remote;   Poor  Judgement:  Impaired  Insight:  Shallow  Psychomotor Activity:  Restlessness  Concentration:  Concentration: Poor  Recall:  [place] of Knowledge:  Fair  Language:  Fair  Akathisia:  No  Handed:  Right  AIMS (if indicated):     Assets:  Desire for Improvement Financial Resources/Insurance Housing Social Support  ADL's:  Impaired  Cognition:  Impaired,  Mild  Sleep:  Number of Hours: 4    Treatment Plan Summary: Daily contact with patient to assess and evaluate symptoms and progress in treatment, Medication management and Plan 59 year old man with a past history of schizophrenia. His current presentation is complicated and assessment is very difficult because of how hard it is to understand him and difficulty knowing his best baseline. It is possible there may be some cognitive impairment although that is hard to assess. He does seem to be agitated and more pressured than normal. He admits that he has been angry and irritable recently. As best I can understand he  attributes that to his physical symptoms however. He seems to be feeling physically bad and vague ways although also with abdominal discomfort. Differential diagnosis for that I suppose would be wide. He has a past history of hepatitis C it. Not known if it was ever definitively treated. However his liver function tests are all stable and unremarkable. Obviously there are other medical problems that are harder to detect that could be causing weight loss discomfort and malaise. For now I will continue his usual medications except that his blood pressure seems to be much better controlled than it was previously so we will not use all for blood pressure medicines but just the  amlodipine. Continue pantoprazole. Adding Colace and as needed Senokot for constipation. We may need to observe him for a few days to see if we can get a better understanding of what is going on. Continue 15-minute checks.  Observation Level/Precautions:  15 minute checks  Laboratory:  Chemistry Profile  Psychotherapy:    Medications:    Consultations:    Discharge Concerns:    Estimated LOS:  Other:     Physician Treatment Plan for Primary Diagnosis: Schizophrenia ([place]) Long Term Goal(s): Improvement in symptoms so as ready for discharge  Short Term Goals: Ability to verbalize feelings will improve, Ability to demonstrate self-control will improve and Ability to identify and develop effective coping behaviors will improve  Physician Treatment Plan for Secondary Diagnosis: Principal Problem:   Schizophrenia ([place]) Active Problems:   Essential hypertension, benign   Chronic kidney disease   Hepatitis C   End stage renal disease ([place])   Dialysis patient ([place])  Long Term Goal(s): Improvement in symptoms so as ready for discharge  Short Term Goals: Ability to maintain clinical measurements within normal limits will improve and Compliance with prescribed medications will improve  I certify that inpatient services furnished  can reasonably be expected to improve the patient's condition.    Alethia Berthold, MD 5/6/20215:17 PM

## 2019-10-05 NOTE — [facility] Group Notes (Signed)
LCSW Group Therapy Note  10/05/2019 2:10 PM  Type of Therapy/Topic:  Group Therapy:  Balance in Life  Participation Level:  Did Not Attend  Description of Group:    This group will address the concept of balance and how it feels and looks when one is unbalanced. Patients will be encouraged to process areas in their lives that are out of balance and identify reasons for remaining unbalanced. Facilitators will guide patients in utilizing problem-solving interventions to address and correct the stressor making their life unbalanced. Understanding and applying boundaries will be explored and addressed for obtaining and maintaining a balanced life. Patients will be encouraged to explore ways to assertively make their unbalanced needs known to significant others in their lives, using other group members and facilitator for support and feedback.  Therapeutic Goals: 1. Patient will identify two or more emotions or situations they have that consume much of in their lives. 2. Patient will identify signs/triggers that life has become out of balance:  3. Patient will identify two ways to set boundaries in order to achieve balance in their lives:  4. Patient will demonstrate ability to communicate their needs through discussion and/or role plays  Summary of Patient Progress: X  Therapeutic Modalities:   Cognitive Behavioral Therapy Solution-Focused Therapy Assertiveness Training  Assunta Curtis MSW, LCSW 10/05/2019 2:10 PM

## 2019-10-05 NOTE — [facility] Group Notes (Signed)
[place] Group Notes:  (Nursing/MHT/Case Management/Adjunct)  Date:  10/05/2019  Time:  9:00 PM  Type of Therapy:  Group Therapy  Participation Level:  Did Not Attend  Summary of Progress/Problems:  Todd Brown 10/05/2019, 9:00 PM

## 2019-10-06 LAB — LIPID PANEL
Cholesterol: 186 mg/dL (ref 0–200)
HDL: 77 mg/dL (ref >40)
LDL Cholesterol: 94 mg/dL (ref 0–99)
Total CHOL/HDL Ratio: 2.4 RATIO
Triglycerides: 75 mg/dL (ref <150)
VLDL: 15 mg/dL (ref 0–40)

## 2019-10-06 LAB — RENAL FUNCTION PANEL
Albumin: 3.5 g/dL (ref 3.5–5.0)
Anion gap: 13 (ref 5–15)
BUN: 65 mg/dL — ABNORMAL HIGH (ref 6–20)
CO2: 27 mmol/L (ref 22–32)
Calcium: 10.1 mg/dL (ref 8.9–10.3)
Chloride: 99 mmol/L (ref 98–111)
Creatinine, Ser: 8.34 mg/dL — ABNORMAL HIGH (ref 0.61–1.24)
GFR calc Af Amer: 7 mL/min — ABNORMAL LOW (ref >60)
GFR calc non Af Amer: 6 mL/min — ABNORMAL LOW (ref >60)
Glucose, Bld: 76 mg/dL (ref 70–99)
Phosphorus: 5.2 mg/dL — ABNORMAL HIGH (ref 2.5–4.6)
Potassium: 4.4 mmol/L (ref 3.5–5.1)
Sodium: 139 mmol/L (ref 135–145)

## 2019-10-06 MED ORDER — CLONIDINE HCL 0.1 MG PO TABS
0.2000 mg | ORAL_TABLET | Freq: Three times a day (TID) | ORAL | Status: DC
  Administered 2019-10-06 – 2019-10-08 (×5): 0.2 mg via ORAL
  Filled 2019-10-06 (×5): qty 2

## 2019-10-06 MED ORDER — HEPARIN SODIUM (PORCINE) 1000 UNIT/ML DIALYSIS
20.0000 [IU]/kg | INTRAMUSCULAR | Status: DC | PRN
  Filled 2019-10-06: qty 2

## 2019-10-06 MED ORDER — CLONIDINE HCL 0.1 MG PO TABS: 0.2000 mg | ORAL_TABLET | Freq: Three times a day (TID) | ORAL | Status: DC

## 2019-10-06 NOTE — [facility] Group Notes (Signed)
[place] Group Notes:  (Nursing/MHT/Case Management/Adjunct)  Date:  10/06/2019  Time:  3:14 PM  Type of Therapy:  Music Group  Participation Level:  Minimal  Participation Quality:  Appropriate  Affect:  Appropriate  Cognitive:  Alert  Insight:  Appropriate  Engagement in Group:  Limited  Modes of Intervention:  Activity, Exploration and Socialization  Summary of Progress/Problems:  Todd Brown 10/06/2019, 3:14 PM

## 2019-10-06 NOTE — [facility] Group Notes (Signed)
Balance In Life 10/06/2019 1PM  Type of Therapy/Topic:  Group Therapy:  Balance in Life  Participation Level:  Did Not Attend  Description of Group:   This group will address the concept of balance and how it feels and looks when one is unbalanced. Patients will be encouraged to process areas in their lives that are out of balance and identify reasons for remaining unbalanced. Facilitators will guide patients in utilizing problem-solving interventions to address and correct the stressor making their life unbalanced. Understanding and applying boundaries will be explored and addressed for obtaining and maintaining a balanced life. Patients will be encouraged to explore ways to assertively make their unbalanced needs known to significant others in their lives, using other group members and facilitator for support and feedback.  Therapeutic Goals: 1. Patient will identify two or more emotions or situations they have that consume much of in their lives. 2. Patient will identify signs/triggers that life has become out of balance:  3. Patient will identify two ways to set boundaries in order to achieve balance in their lives:  4. Patient will demonstrate ability to communicate their needs through discussion and/or role plays  Summary of Patient Progress:    Therapeutic Modalities:   Cognitive Behavioral Therapy Solution-Focused Therapy Assertiveness Training  Badr Piedra Lynelle Smoke, LCSW

## 2019-10-06 NOTE — Progress Notes (Signed)
Patient had elevated B/P, morning norvasc given early

## 2019-10-06 NOTE — Progress Notes (Signed)
HD started. 

## 2019-10-06 NOTE — Progress Notes (Signed)
HD tx ended 

## 2019-10-06 NOTE — Progress Notes (Signed)
[facility] MD Progress Note  10/06/2019 3:16 PM Todd Brown  MRN:  [id number] Subjective: Follow-up for this 59 year old man with a history of schizophrenia.  Patient had dialysis this morning and evidently all went fine without any complaints of behavior problems.  I saw him after dialysis when he was eating lunch.  He appeared to have a robust appetite.  I asked him if he was feeling any different than he was yesterday and he gave a noncommittal answer but did not have any specific complaints.  So far appear on the ward he has not shown any anger or irritability or inappropriate behavior but has been cooperative and appropriate.  He does not appear to be obviously responding to internal stimuli.  Blood pressure today was significantly higher which is consistent with his prior history. Principal Problem: Schizophrenia ([place]) Diagnosis: Principal Problem:   Schizophrenia ([place]) Active Problems:   Essential hypertension, benign   Chronic kidney disease   Hepatitis C   End stage renal disease ([place])   Dialysis patient ([place])  Total Time spent with patient: 30 minutes  Past Psychiatric History: Patient has a long history of schizophrenia usually maintained pretty stably by his outpatient team  Past Medical History:  Past Medical History:  Diagnosis Date  . Anemia   . Diabetes mellitus without complication ([place])   . Dialysis patient [facility])    Mon. -Wed.- Fri  . DVT (deep venous thrombosis) ([place])    cephalic and basolic vein thrombosis  . ESRD (end stage renal disease) ([place])   . Hyperlipidemia   . Malignant hypertension   . Renal artery stenosis ([place])   . Schizophrenia [facility])     Past Surgical History:  Procedure Laterality Date  . A/V FISTULAGRAM N/A 08/06/2016   Procedure: A/V Fistulagram;  Surgeon: Algernon Huxley, MD;  Location: [place] CV LAB;  Service: Cardiovascular;  Laterality: N/A;  . A/V SHUNT INTERVENTION N/A 08/06/2016   Procedure: A/V Shunt Intervention;  Surgeon: Algernon Huxley, MD;   Location: [place] CV LAB;  Service: Cardiovascular;  Laterality: N/A;  . AV FISTULA PLACEMENT Left 12/26/2014   Procedure: ARTERIOVENOUS (AV) FISTULA CREATION;  Surgeon: Algernon Huxley, MD;  Location: [facility] ORS;  Service: Vascular;  Laterality: Left;  . ESOPHAGOGASTRODUODENOSCOPY (EGD) WITH PROPOFOL N/A 05/06/2017   Procedure: ESOPHAGOGASTRODUODENOSCOPY (EGD) WITH PROPOFOL;  Surgeon: Jonathon Bellows, MD;  Location: [facility] ENDOSCOPY;  Service: Gastroenterology;  Laterality: N/A;  . INSERTION OF DIALYSIS CATHETER Right   . PERIPHERAL VASCULAR CATHETERIZATION N/A 10/22/2014   Procedure: Dialysis/Perma Catheter Insertion;  Surgeon: Algernon Huxley, MD;  Location: [place] CV LAB;  Service: Cardiovascular;  Laterality: N/A;  . PERIPHERAL VASCULAR CATHETERIZATION N/A 02/28/2015   Procedure: Dialysis/Perma Catheter Removal;  Surgeon: Algernon Huxley, MD;  Location: [place] CV LAB;  Service: Cardiovascular;  Laterality: N/A;  . Repair fx left lower leg     yrs ago (age 44)   Family History:  Family History  Problem Relation Age of Onset  . Diabetes Neg Hx    Family Psychiatric  History: See previous.  No history. Social History:  Social History   Substance and Sexual Activity  Alcohol Use No     Social History   Substance and Sexual Activity  Drug Use Yes  . Frequency: 1.0 times per week  . Types: Marijuana    Social History   Socioeconomic History  . Marital status: Single    Spouse name: Not on file  . Number of children: Not  on file  . Years of education: Not on file  . Highest education level: Not on file  Occupational History  . Occupation: Disability   Tobacco Use  . Smoking status: Current Every Day Smoker    Packs/day: 0.50    Types: Cigarettes  . Smokeless tobacco: Never Used  . Tobacco comment: 10 cigars a day  Substance and Sexual Activity  . Alcohol use: No  . Drug use: Yes    Frequency: 1.0 times per week    Types: Marijuana  . Sexual activity: Not Currently   Other Topics Concern  . Not on file  Social History Narrative  . Not on file   Social Determinants of Health   Financial Resource Strain: Low Risk   . Difficulty of Paying Living Expenses: Not hard at all  Food Insecurity: No Food Insecurity  . Worried About Charity fundraiser in the Last Year: Never true  . Ran Out of Food in the Last Year: Never true  Transportation Needs: No Transportation Needs  . Lack of Transportation (Medical): No  . Lack of Transportation (Non-Medical): No  Physical Activity: Unknown  . Days of Exercise per Week: Patient refused  . Minutes of Exercise per Session: Patient refused  Stress: No Stress Concern Present  . Feeling of Stress : Not at all  Social Connections: Unknown  . Frequency of Communication with Friends and Family: Patient refused  . Frequency of Social Gatherings with Friends and Family: Patient refused  . Attends Religious Services: Patient refused  . Active Member of Clubs or Organizations: Patient refused  . Attends Archivist Meetings: Patient refused  . Marital Status: Patient refused   Additional Social History:                         Sleep: Fair  Appetite:  Fair  Current Medications: Current Facility-Administered Medications  Medication Dose Route Frequency Provider Last Rate Last Admin  . acetaminophen (TYLENOL) tablet 500 mg  500 mg Oral Q4H PRN Caroline Sauger, NP   500 mg at 10/06/19 2694  . albuterol (PROVENTIL) (2.5 MG/3ML) 0.083% nebulizer solution 2.5 mg  2.5 mg Inhalation Q4H PRN Caroline Sauger, NP   2.5 mg at 10/05/19 8546  . amLODipine (NORVASC) tablet 10 mg  10 mg Oral Daily Aldrin Engelhard, Madie Reno, MD   10 mg at 10/06/19 2703  . calcium acetate (PHOSLO) capsule 1,334 mg  1,334 mg Oral TID WC Caroline Sauger, NP   1,334 mg at 10/06/19 1347  . carbamazepine (TEGRETOL XR) 12 hr tablet 1,000 mg  1,000 mg Oral Chester Holstein, Geni Bers, NP   1,000 mg at 10/06/19 [phone]  . Chlorhexidine  Gluconate Cloth 2 % PADS 6 each  6 each Topical Q0600 Murlean Iba, MD      . cholecalciferol (VITAMIN D3) tablet 1,000 Units  1,000 Units Oral Daily Caroline Sauger, NP   1,000 Units at 10/06/19 1349  . cloNIDine (CATAPRES) tablet 0.2 mg  0.2 mg Oral TID Tala Eber T, MD   0.2 mg at 10/06/19 1403  . docusate sodium (COLACE) capsule 100 mg  100 mg Oral BID Mickala Laton, Madie Reno, MD   Stopped at 10/06/19 1348  . ferrous sulfate tablet 325 mg  325 mg Oral Daily Caroline Sauger, NP   325 mg at 10/06/19 1348  . fluPHENAZine (PROLIXIN) tablet 2.5 mg  2.5 mg Oral BID Caroline Sauger, NP   2.5 mg at 10/06/19 1349  . heparin injection  1,400 Units  20 Units/kg Dialysis PRN Murlean Iba, MD      . mometasone-formoterol (DULERA) 200-5 MCG/ACT inhaler 2 puff  2 puff Inhalation BID Caroline Sauger, NP   2 puff at 10/06/19 0837  . multivitamin (RENA-VIT) tablet 1 tablet  1 tablet Oral Daily Caroline Sauger, NP   1 tablet at 10/06/19 1348  . nicotine (NICODERM CQ - dosed in mg/24 hours) patch 21 mg  21 mg Transdermal Daily Leotha Westermeyer, Madie Reno, MD   21 mg at 10/06/19 2671  . pantoprazole (PROTONIX) EC tablet 40 mg  40 mg Oral Daily Caroline Sauger, NP   40 mg at 10/06/19 2458  . senna (SENOKOT) tablet 17.2 mg  2 tablet Oral Daily PRN Charlena Haub T, MD      . temazepam (RESTORIL) capsule 15 mg  15 mg Oral QHS Eunice Winecoff, Madie Reno, MD   15 mg at 10/05/19 2100  . trihexyphenidyl (ARTANE) tablet 2 mg  2 mg Oral BID Caroline Sauger, NP   2 mg at 10/06/19 1353    Lab Results:  Results for orders placed or performed during the hospital encounter of 10/04/19 (from the past 48 hour(s))  Carbamazepine level, total     Status: None   Collection Time: 10/04/19  3:45 PM  Result Value Ref Range   Carbamazepine Lvl 4.7 4.0 - 12.0 ug/mL    Comment: Performed at [facility], [address] [place], [place] [address]  Lipid panel     Status: None   Collection Time: 10/06/19  6:45 AM   Result Value Ref Range   Cholesterol 186 0 - 200 mg/dL   Triglycerides 75 <150 mg/dL   HDL 77 >40 mg/dL   Total CHOL/HDL Ratio 2.4 RATIO   VLDL 15 0 - 40 mg/dL   LDL Cholesterol 94 0 - 99 mg/dL    Comment:        Total Cholesterol/HDL:CHD Risk Coronary Heart Disease Risk Table                     Men   Women  1/2 Average Risk   3.4   3.3  Average Risk       5.0   4.4  2 X Average Risk   9.6   7.1  3 X Average Risk  23.4   11.0        Use the calculated Patient Ratio above and the CHD Risk Table to determine the patient's CHD Risk.        ATP III CLASSIFICATION (LDL):  <100     mg/dL   Optimal  100-129  mg/dL   Near or Above                    Optimal  130-159  mg/dL   Borderline  160-189  mg/dL   High  >190     mg/dL   Very High Performed at [facility], [address] [place], [place] [address]   Renal function panel     Status: Abnormal   Collection Time: 10/06/19  6:45 AM  Result Value Ref Range   Sodium 139 135 - 145 mmol/L   Potassium 4.4 3.5 - 5.1 mmol/L   Chloride 99 98 - 111 mmol/L   CO2 27 22 - 32 mmol/L   Glucose, Bld 76 70 - 99 mg/dL    Comment: Glucose reference range applies only to samples taken after fasting for at least 8 hours.  BUN 65 (H) 6 - 20 mg/dL   Creatinine, Ser 8.34 (H) 0.61 - 1.24 mg/dL   Calcium 10.1 8.9 - 10.3 mg/dL   Phosphorus 5.2 (H) 2.5 - 4.6 mg/dL   Albumin 3.5 3.5 - 5.0 g/dL   GFR calc non Af Amer 6 (L) >60 mL/min   GFR calc Af Amer 7 (L) >60 mL/min   Anion gap 13 5 - 15    Comment: Performed at [facility], [address] [place], [place] [address]    Blood Alcohol level:  Lab Results  Component Value Date   [facility] <10 10/04/2019   ETH <5 03/54/6568    Metabolic Disorder Labs: Lab Results  Component Value Date   HGBA1C 5.0 09/12/2018   MPG 97 09/12/2018   No results found for: PROLACTIN Lab Results  Component Value Date   CHOL 186 10/06/2019   TRIG 75 10/06/2019   HDL 77 10/06/2019    CHOLHDL 2.4 10/06/2019   VLDL 15 10/06/2019   LDLCALC 94 10/06/2019    Physical Findings: AIMS:  , ,  ,  ,    CIWA:    COWS:     Musculoskeletal: Strength & Muscle Tone: within normal limits Gait & Station: normal Patient leans: N/A  Psychiatric Specialty Exam: Physical Exam  Nursing note and vitals reviewed. Constitutional: He appears well-developed and well-nourished.  HENT:  Head: Normocephalic and atraumatic.  Eyes: Pupils are equal, round, and reactive to light. Conjunctivae are normal.  Cardiovascular: Regular rhythm and normal heart sounds.  Respiratory: Effort normal. No respiratory distress.  GI: Soft.  Musculoskeletal:        General: Normal range of motion.     Cervical back: Normal range of motion.  Neurological: He is alert.  Skin: Skin is warm and dry.  Psychiatric: Judgment normal. His affect is blunt. His speech is delayed. He is slowed. Cognition and memory are impaired. He expresses no homicidal and no suicidal ideation.    Review of Systems  Constitutional: Positive for fatigue.  HENT: Negative.   Eyes: Negative.   Respiratory: Negative.   Cardiovascular: Negative.   Gastrointestinal: Negative.   Musculoskeletal: Negative.   Skin: Negative.   Neurological: Negative.   Psychiatric/Behavioral: Positive for dysphoric mood. Negative for hallucinations, self-injury and suicidal ideas.    Blood pressure (!) 195/98, pulse 80, temperature 98.6 F (37 C), temperature source Oral, resp. rate 20, height 6\' 3"  (1.905 m), weight 69.4 kg, SpO2 95 %.Body mass index is 19.12 kg/m.  General Appearance: Casual  Eye Contact:  Fair  Speech:  Slow  Volume:  Decreased  Mood:  Dysphoric  Affect:  Congruent  Thought Process:  Disorganized  Orientation:  Full (Time, Place, and Person)  Thought Content:  Tangential  Suicidal Thoughts:  No  Homicidal Thoughts:  No  Memory:  Immediate;   Fair Recent;   Poor Remote;   Fair  Judgement:  Impaired  Insight:  Shallow   Psychomotor Activity:  Decreased  Concentration:  Concentration: Poor  Recall:  [employer] of Knowledge:  Fair  Language:  Fair  Akathisia:  No  Handed:  Right  AIMS (if indicated):     Assets:  Desire for Improvement Housing Social Support  ADL's:  Impaired  Cognition:  Impaired,  Mild  Sleep:  Number of Hours: 5.5     Treatment Plan Summary: Daily contact with patient to assess and evaluate symptoms and progress in treatment, Medication management and Plan 59 year old gentleman with schizophrenia brought to  the hospital specifically because of agitation at dialysis combined with having missed some sessions of dialysis although act team reports that his health and self-care seem to have been declining recently.  Patient is currently calm and not showing any behavior problems.  It remains difficult to communicate with him in part because of his disorganized thinking and in part because his tardive dyskinesia makes his speech very garbled.  Psychiatrically appears to be fairly stable.  It may just be that his behavior problems were related to poor health.  I am told today that the act team would like to move him into the [facility] at discharge.  I spoke with the patient about this and it was the first he had heard of it but he did not necessarily react badly to it either.  He sort of shrugged.  Blood pressure as noted is higher today.  He has a history of blood pressure that can run so high that it has needed up to 4 medications in the past.  For today I will add back the clonidine at 0.2 mg 3 times a day.  We can keep an eye on it and if this is satisfactory, fine, but otherwise more medicine can be added as needed.  Alethia Berthold, MD 10/06/2019, 3:16 PM

## 2019-10-06 NOTE — Progress Notes (Signed)
Pre HD  

## 2019-10-06 NOTE — BHH Counselor (Signed)
Adult Comprehensive Assessment  Patient ID: Todd Brown, male   DOB: February 02, 1961, 59 y.o.   MRN: [id number]  Information Source: Information source: (Pt is not a good historian, difficulty understading his speech, disorganized thought. Collateral information gathered from patient ACT Team members, spoke with [place])  Current Stressors:  Patient states their primary concerns and needs for treatment are:: Team members report the pt was brought to the hospital due to worsening physical health. They report he needed to have "fluid drained." Educational / Learning stressors: Unknown Employment / Job issues: Pt receives disability Family Relationships: Team reports pt mother is deceased and brother is incarcerated Museum/gallery curator / Lack of resources (include bankruptcy): Limited income Housing / Lack of housing: Lives alone. Team reports they are working on assisted living placement([facility]) Physical health (include injuries & life threatening diseases): Kidney, heart problems. 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  Living/Environment/Situation:  Living Arrangements: Alone Who else lives in the home?: Alone  Family History:  Marital status: Single Does patient have children?: No  Childhood History:  Additional childhood history information: Unknown childhood history Patient's description of current relationship with people who raised him/her: Mother deceased Does patient have siblings?: Yes Number of Siblings: 1 Description of patient's current relationship with siblings: Bother-incarcerated Did patient suffer any verbal/emotional/physical/sexual abuse as a child?: No Did patient suffer from severe childhood neglect?: No Has patient ever been sexually abused/assaulted/raped as an adolescent or adult?: No Was the patient ever a victim of a crime or a disaster?: No Witnessed  domestic violence?: No Has patient been effected by domestic violence as an adult?: No  Education:  Highest grade of school patient has completed: Unknown Currently a Ship broker?: No Learning disability?: (Unknown, but team suspects he may have disability)  Employment/Work Situation:   Employment situation: On disability Why is patient on disability: Mental, physical health How long has patient been on disability: Unknown What is the longest time patient has a held a job?: Unknown Did You Receive Any Psychiatric Treatment/Services While in the [employer]?: No Are There Guns or Other Weapons in [place]?: No Are These Psychologist, educational?: (Team denies pt having access)  Financial Resources:   Financial resources: Teacher, early years/pre, Medicaid Does patient have a Programmer, applications or guardian?: No  Alcohol/Substance Abuse:   What has been your use of drugs/alcohol within the last 12 months?: Alcohol If attempted suicide, did drugs/alcohol play a role in this?: No Alcohol/Substance Abuse Treatment Hx: Denies past history Has alcohol/substance abuse ever caused legal problems?: No  Social Support System:   Describe Community Support System: [facility], no other supports identified Type of faith/religion: Unknown  Leisure/Recreation:   Leisure and Hobbies: "Sitting on his front porch, watching TV"  Strengths/Needs:   What is the patient's perception of their strengths?: "Caring person, friendly" Patient states these barriers may affect/interfere with their treatment: None reported Patient states these barriers may affect their return to the community: None reported  Discharge Plan:   Currently receiving community mental health services: Yes (From Whom)([facility] since 2010) Patient states concerns and preferences for aftercare planning are: Pt will resume service with current provider Does patient have access to transportation?: Yes(Team members report they will provide  transportation and would like for him to d/c hospital and go to assisted living placement) Does patient have financial barriers related to discharge medications?: No Will patient be returning to same living situation after  discharge?: Yes  Summary/Recommendations:   Summary and Recommendations (to be completed by the evaluator): Pt is a poor historian and presents disorganized thought. Collateral information gathered from pt ACT Team. Pt is a 59 yr old male with a diagnosis of schizophrenia who was brought to the hospital due to declining physical health.  Pt's dialysis team referred him to the ED out of concern for pt poor self care. Pt is being followed by [facility] and they are working on finding him an assisted living placement. Team members voice concern about pt living alone and caring for himself. While here, patient will benefit from crisis stabilization, medication evaluation, group therapy and psychoeducation. In addition, it is recommended that patient remain compliant with the established discharge plan and continue treatment.  Lachandra Dettmann Lynelle Smoke. 10/06/2019

## 2019-10-06 NOTE — BHH Suicide Risk Assessment (Signed)
[place] INPATIENT:  Family/Significant Other Suicide Prevention Education  Suicide Prevention Education:  Patient Refusal for Family/Significant Other Suicide Prevention Education: The patient Todd Brown has refused to provide written consent for family/significant other to be provided Family/Significant Other Suicide Prevention Education during admission and/or prior to discharge.  Physician notified.  Mushka Laconte T Kiyani Jernigan 10/06/2019, 10:55 AM

## 2019-10-06 NOTE — Progress Notes (Signed)
POST HD 

## 2019-10-06 NOTE — Plan of Care (Signed)
Patient denies SI / HI / AVH. Patient is in room during assessment. Patient is appropriate during assessment. Patient is difficult to understand. Patient denies anxiety and depression. Patients focus is on chronic back pain. Patient is transported to dialysis today. Patient's safety is maintained on the unit.    Problem: Education: Goal: Knowledge of [place] [facility] Education information/materials will improve Outcome: Progressing Goal: Mental status will improve Outcome: Progressing Goal: Verbalization of understanding the information provided will improve Outcome: Progressing

## 2019-10-07 MED ORDER — TRAZODONE HCL 50 MG PO TABS
50.0000 mg | ORAL_TABLET | Freq: Once | ORAL | Status: AC | PRN
  Administered 2019-10-11: 50 mg via ORAL
  Filled 2019-10-07: qty 1

## 2019-10-07 MED ORDER — CLONIDINE HCL 0.1 MG PO TABS
0.1000 mg | ORAL_TABLET | Freq: Once | ORAL | Status: AC
  Administered 2019-10-07: 0.1 mg via ORAL
  Filled 2019-10-07: qty 1

## 2019-10-07 NOTE — Plan of Care (Signed)
Patient denies SI / HI / AVH. Patient is appropriate and participates during assessment. Patient is without complaint. Patient is eating breakfast in the milieu during assessment. Patient is often seen in milieu interacting with peers. Patient adheres to scheduled medication. Patient's safety is maintained on the unit.    Problem: Education: Goal: Knowledge of [place] [facility] Education information/materials will improve Outcome: Progressing Goal: Emotional status will improve Outcome: Progressing Goal: Mental status will improve Outcome: Progressing Goal: Verbalization of understanding the information provided will improve Outcome: Progressing

## 2019-10-07 NOTE — Progress Notes (Signed)
Pt is calm and cooperative on interaction and observation with staff. Pt denies SI/HI/AVH and pain on assessment. Pt interacted appropriately with others in the milieu. Pt requested PRN albuterol respiratory nebulizer treatment from this writer. Respiratory therapy (RT) was contacted by this Probation officer and treatment completed by RT. No further issues to report nor concerns by patient for the remainder of the shift. Pt slept through the night. Compliance of Fall Risk precautions adhered to by staff this shift. Observation rounds performed and pt's safety maintained during this shift.

## 2019-10-07 NOTE — Plan of Care (Signed)
Visible in the milieu and pleasant on approach. Alert and oriented and denying thoughts of self harm. Denied hallucinations. No sign of distress.

## 2019-10-07 NOTE — Progress Notes (Signed)
Patient received bedtime medication. BP 187/104. Provider was contacted. Clonidine 0.1mg  given as prescribed. Will reevaluate if pt awake.

## 2019-10-07 NOTE — BHH Group Notes (Signed)
LCSW Group Therapy 10/07/2019 1:00pm  Type of Therapy and Topic:  Group Therapy:  Setting Goals  Participation Level:  Did Not Attend  Description of Group: In this process group, patients discussed using strengths to work toward goals and address challenges.  Patients identified two positive things about themselves and one goal they were working on.  Patients were given the opportunity to share openly and support each other's plan for self-empowerment.  The group discussed the value of gratitude and were encouraged to have a daily reflection of positive characteristics or circumstances.  Patients were encouraged to identify a plan to utilize their strengths to work on current challenges and goals.  Therapeutic Goals 1. Patient will verbalize personal strengths/positive qualities and relate how these can assist with achieving desired personal goals 2. Patients will verbalize affirmation of peers plans for personal change and goal setting 3. Patients will explore the value of gratitude and positive focus as related to successful achievement of goals 4. Patients will verbalize a plan for regular reinforcement of personal positive qualities and circumstances.  Summary of Patient Progress: Patient did not attend group.   Therapeutic Modalities Cognitive Behavioral Therapy Motivational Interviewing    Netta Neat, MSW, LCSW Clinical Social Work

## 2019-10-07 NOTE — Plan of Care (Signed)
Pt slept through the night. Problem: Activity: Goal: Sleeping patterns will improve Outcome: Progressing

## 2019-10-07 NOTE — Progress Notes (Signed)
Per nursing judgement this writer allowed patient to use his personal inhaler as the patient woke up stating he was short of breath. Patient has his two personal inhalers in his med drawer from home that were brought down from ED with him.

## 2019-10-07 NOTE — Progress Notes (Signed)
[facility] MD Progress Note  10/07/2019 11:30 AM Todd Brown  MRN:  [id number]  Todd Brown is a 59yo M with h/o Schizophrenia, who was admitted to [facility] unit due to agitation. Patient seen.  Chart reviewed. Patient discussed with nursing; no overnight events reported. Subjective:  Patient reports "I feel good". He reports no complaints, except of "poor sleep". He is asking to give him a sleeping pill so he can take a nap after lunch. His BP was high again today, he denies any physical complaints. He denies side effects from his medications. Denies any hallucinations. Denies suicidal or homicidal thoughts.  Principal Problem: Schizophrenia ([place]) Diagnosis: Principal Problem:   Schizophrenia ([place]) Active Problems:   Essential hypertension, benign   Chronic kidney disease   Hepatitis C   End stage renal disease ([place])   Dialysis patient ([place])  Total Time spent with patient: 15 minutes  Past Psychiatric History: see H&P  Past Medical History:  Past Medical History:  Diagnosis Date  . Anemia   . Diabetes mellitus without complication ([place])   . Dialysis patient [facility])    Mon. -Wed.- Fri  . DVT (deep venous thrombosis) ([place])    cephalic and basolic vein thrombosis  . ESRD (end stage renal disease) ([place])   . Hyperlipidemia   . Malignant hypertension   . Renal artery stenosis ([place])   . Schizophrenia [facility])     Past Surgical History:  Procedure Laterality Date  . A/V FISTULAGRAM N/A 08/06/2016   Procedure: A/V Fistulagram;  Surgeon: Algernon Huxley, MD;  Location: [facility] CV LAB;  Service: Cardiovascular;  Laterality: N/A;  . A/V SHUNT INTERVENTION N/A 08/06/2016   Procedure: A/V Shunt Intervention;  Surgeon: Algernon Huxley, MD;  Location: [place] CV LAB;  Service: Cardiovascular;  Laterality: N/A;  . AV FISTULA PLACEMENT Left 12/26/2014   Procedure: ARTERIOVENOUS (AV) FISTULA CREATION;  Surgeon: Algernon Huxley, MD;  Location: [facility] ORS;  Service: Vascular;  Laterality: Left;  .  ESOPHAGOGASTRODUODENOSCOPY (EGD) WITH PROPOFOL N/A 05/06/2017   Procedure: ESOPHAGOGASTRODUODENOSCOPY (EGD) WITH PROPOFOL;  Surgeon: Jonathon Bellows, MD;  Location: [facility] ENDOSCOPY;  Service: Gastroenterology;  Laterality: N/A;  . INSERTION OF DIALYSIS CATHETER Right   . PERIPHERAL VASCULAR CATHETERIZATION N/A 10/22/2014   Procedure: Dialysis/Perma Catheter Insertion;  Surgeon: Algernon Huxley, MD;  Location: [place] CV LAB;  Service: Cardiovascular;  Laterality: N/A;  . PERIPHERAL VASCULAR CATHETERIZATION N/A 02/28/2015   Procedure: Dialysis/Perma Catheter Removal;  Surgeon: Algernon Huxley, MD;  Location: [place] CV LAB;  Service: Cardiovascular;  Laterality: N/A;  . Repair fx left lower leg     yrs ago (age 69)   Family History:  Family History  Problem Relation Age of Onset  . Diabetes Neg Hx    Family Psychiatric  History: see H&P Social History:  Social History   Substance and Sexual Activity  Alcohol Use No     Social History   Substance and Sexual Activity  Drug Use Yes  . Frequency: 1.0 times per week  . Types: Marijuana    Social History   Socioeconomic History  . Marital status: Single    Spouse name: Not on file  . Number of children: Not on file  . Years of education: Not on file  . Highest education level: Not on file  Occupational History  . Occupation: Disability   Tobacco Use  . Smoking status: Current Every Day Smoker    Packs/day: 0.50    Types: Cigarettes  .  Smokeless tobacco: Never Used  . Tobacco comment: 10 cigars a day  Substance and Sexual Activity  . Alcohol use: No  . Drug use: Yes    Frequency: 1.0 times per week    Types: Marijuana  . Sexual activity: Not Currently  Other Topics Concern  . Not on file  Social History Narrative  . Not on file   Social Determinants of Health   Financial Resource Strain: Low Risk   . Difficulty of Paying Living Expenses: Not hard at all  Food Insecurity: No Food Insecurity  . Worried About Ship broker in the Last Year: Never true  . Ran Out of Food in the Last Year: Never true  Transportation Needs: No Transportation Needs  . Lack of Transportation (Medical): No  . Lack of Transportation (Non-Medical): No  Physical Activity: Unknown  . Days of Exercise per Week: Patient refused  . Minutes of Exercise per Session: Patient refused  Stress: No Stress Concern Present  . Feeling of Stress : Not at all  Social Connections: Unknown  . Frequency of Communication with Friends and Family: Patient refused  . Frequency of Social Gatherings with Friends and Family: Patient refused  . Attends Religious Services: Patient refused  . Active Member of Clubs or Organizations: Patient refused  . Attends Archivist Meetings: Patient refused  . Marital Status: Patient refused   Additional Social History:                         Sleep: Fair  Appetite:  Good  Current Medications: Current Facility-Administered Medications  Medication Dose Route Frequency Provider Last Rate Last Admin  . acetaminophen (TYLENOL) tablet 500 mg  500 mg Oral Q4H PRN Caroline Sauger, NP   500 mg at 10/07/19 1119  . albuterol (PROVENTIL) (2.5 MG/3ML) 0.083% nebulizer solution 2.5 mg  2.5 mg Inhalation Q4H PRN Caroline Sauger, NP   2.5 mg at 10/06/19 2154  . amLODipine (NORVASC) tablet 10 mg  10 mg Oral Daily Clapacs, Madie Reno, MD   10 mg at 10/07/19 0742  . calcium acetate (PHOSLO) capsule 1,334 mg  1,334 mg Oral TID WC Caroline Sauger, NP   1,334 mg at 10/07/19 1117  . carbamazepine (TEGRETOL XR) 12 hr tablet 1,000 mg  1,000 mg Oral Chester Holstein, Geni Bers, NP   1,000 mg at 10/07/19 0742  . Chlorhexidine Gluconate Cloth 2 % PADS 6 each  6 each Topical Q0600 Murlean Iba, MD      . cholecalciferol (VITAMIN D3) tablet 1,000 Units  1,000 Units Oral Daily Caroline Sauger, NP   1,000 Units at 10/07/19 [phone]  . cloNIDine (CATAPRES) tablet 0.2 mg  0.2 mg Oral TID Clapacs, John T, MD    0.2 mg at 10/07/19 1117  . docusate sodium (COLACE) capsule 100 mg  100 mg Oral BID Clapacs, Madie Reno, MD   100 mg at 10/07/19 0742  . ferrous sulfate tablet 325 mg  325 mg Oral Daily Caroline Sauger, NP   325 mg at 10/07/19 0742  . fluPHENAZine (PROLIXIN) tablet 2.5 mg  2.5 mg Oral BID Caroline Sauger, NP   2.5 mg at 10/07/19 0272  . heparin injection 1,400 Units  20 Units/kg Dialysis PRN Murlean Iba, MD      . mometasone-formoterol (DULERA) 200-5 MCG/ACT inhaler 2 puff  2 puff Inhalation BID Caroline Sauger, NP   2 puff at 10/07/19 [phone]  . multivitamin (RENA-VIT) tablet 1 tablet  1 tablet  Oral Daily Caroline Sauger, NP   1 tablet at 10/07/19 [phone]  . nicotine (NICODERM CQ - dosed in mg/24 hours) patch 21 mg  21 mg Transdermal Daily Clapacs, Madie Reno, MD   21 mg at 10/07/19 0743  . pantoprazole (PROTONIX) EC tablet 40 mg  40 mg Oral Daily Caroline Sauger, NP   40 mg at 10/07/19 0742  . senna (SENOKOT) tablet 17.2 mg  2 tablet Oral Daily PRN Clapacs, John T, MD      . temazepam (RESTORIL) capsule 15 mg  15 mg Oral QHS Clapacs, Madie Reno, MD   15 mg at 10/06/19 2111  . traZODone (DESYREL) tablet 50 mg  50 mg Oral Once PRN Larita Fife, MD      . trihexyphenidyl (ARTANE) tablet 2 mg  2 mg Oral BID Caroline Sauger, NP   2 mg at 10/07/19 5974    Lab Results:  Results for orders placed or performed during the hospital encounter of 10/04/19 (from the past 48 hour(s))  Lipid panel     Status: None   Collection Time: 10/06/19  6:45 AM  Result Value Ref Range   Cholesterol 186 0 - 200 mg/dL   Triglycerides 75 <150 mg/dL   HDL 77 >40 mg/dL   Total CHOL/HDL Ratio 2.4 RATIO   VLDL 15 0 - 40 mg/dL   LDL Cholesterol 94 0 - 99 mg/dL    Comment:        Total Cholesterol/HDL:CHD Risk Coronary Heart Disease Risk Table                     Men   Women  1/2 Average Risk   3.4   3.3  Average Risk       5.0   4.4  2 X Average Risk   9.6   7.1  3 X Average Risk  23.4   11.0         Use the calculated Patient Ratio above and the CHD Risk Table to determine the patient's CHD Risk.        ATP III CLASSIFICATION (LDL):  <100     mg/dL   Optimal  100-129  mg/dL   Near or Above                    Optimal  130-159  mg/dL   Borderline  160-189  mg/dL   High  >190     mg/dL   Very High Performed at [facility], [address] [place], [place] [address]   Renal function panel     Status: Abnormal   Collection Time: 10/06/19  6:45 AM  Result Value Ref Range   Sodium 139 135 - 145 mmol/L   Potassium 4.4 3.5 - 5.1 mmol/L   Chloride 99 98 - 111 mmol/L   CO2 27 22 - 32 mmol/L   Glucose, Bld 76 70 - 99 mg/dL    Comment: Glucose reference range applies only to samples taken after fasting for at least 8 hours.   BUN 65 (H) 6 - 20 mg/dL   Creatinine, Ser 8.34 (H) 0.61 - 1.24 mg/dL   Calcium 10.1 8.9 - 10.3 mg/dL   Phosphorus 5.2 (H) 2.5 - 4.6 mg/dL   Albumin 3.5 3.5 - 5.0 g/dL   GFR calc non Af Amer 6 (L) >60 mL/min   GFR calc Af Amer 7 (L) >60 mL/min   Anion gap 13 5 - 15    Comment: Performed  at [facility], [address] [place], [place] [address]    Blood Alcohol level:  Lab Results  Component Value Date   [facility] <10 10/04/2019   ETH <5 61/95/0932    Metabolic Disorder Labs: Lab Results  Component Value Date   HGBA1C 5.0 09/12/2018   MPG 97 09/12/2018   No results found for: PROLACTIN Lab Results  Component Value Date   CHOL 186 10/06/2019   TRIG 75 10/06/2019   HDL 77 10/06/2019   CHOLHDL 2.4 10/06/2019   VLDL 15 10/06/2019   LDLCALC 94 10/06/2019    Physical Findings: AIMS:  , ,  ,  ,    CIWA:    COWS:     Musculoskeletal: Strength & Muscle Tone: within normal limits Gait & Station: normal Patient leans: N/A  Psychiatric Specialty Exam: Physical Exam  Review of Systems  Blood pressure (!) 190/80, pulse 76, temperature 98.6 F (37 C), temperature source Oral, resp. rate 20, height 6\' 3"  (1.905 m), weight 69.4 kg,  SpO2 95 %.Body mass index is 19.12 kg/m.  General Appearance: Disheveled  Eye Contact:  Good  Speech:  Garbled  Volume:  Increased  Mood:  Euthymic  Affect:  Appropriate and Congruent  Thought Process:  Coherent  Orientation:  Full (Time, Place, and Person)  Thought Content:  Logical  Suicidal Thoughts:  No  Homicidal Thoughts:  No  Memory:  Immediate;   Fair Recent;   Fair Remote;   NA  Judgement:  Fair  Insight:  Shallow  Psychomotor Activity:  Normal  Concentration:  Concentration: Fair and Attention Span: Fair  Recall:  [employer] of Knowledge:  NA  Language:  Fair  Akathisia:  No  Handed:  Right  AIMS (if indicated):     Assets:  Desire for Improvement Housing Social Support  ADL's:  Impaired  Cognition:  Impaired,  Mild  Sleep:  Number of Hours: 5.5     Treatment Plan Summary: Daily contact with patient to assess and evaluate symptoms and progress in treatment and Medication management  59yo M with h/o Schizophrenia, who was admitted to [facility] unit due to reported agitation at outpatient dialysis facility. At the unit patient is calm, cooperative, compliant with medications and dialysis and does not demonstrate any unsafe, threatening or agitated behaviors. His recent agitated behavior might be related to some type of physical discomfort, especially taking in consideration the fact that the patient cannot always communicate his needs effectively due to TD and garbled speech. At there moment, patient appears to be stable and is likely at his baseline. His psych medications will be continued without changes today: Carbamazepine 1000mg  PO QAM for mood stabilization, Prolixin 2.5mg  PO BID for psychosis and Restoril 15mg  PO QHS for sleep as well as Cogentin for TD. BP medication was added just yesterday and will be continued for today, but if no improvement in BP, will likely be increased tomorrow. Patient has an ACT team and their plan is to move him into the [place] at  discharge.  Larita Fife, MD 10/07/2019, 11:30 AM

## 2019-10-08 MED ORDER — CLONIDINE HCL 0.1 MG PO TABS
0.3000 mg | ORAL_TABLET | Freq: Three times a day (TID) | ORAL | Status: DC
  Administered 2019-10-08 – 2019-10-22 (×39): 0.3 mg via ORAL
  Filled 2019-10-08 (×39): qty 3

## 2019-10-08 MED ORDER — HYDRALAZINE HCL 25 MG PO TABS
25.0000 mg | ORAL_TABLET | Freq: Two times a day (BID) | ORAL | Status: DC
  Administered 2019-10-08 – 2019-10-09 (×3): 25 mg via ORAL
  Filled 2019-10-08 (×5): qty 1

## 2019-10-08 NOTE — Progress Notes (Signed)
[facility] Todd Brown  10/08/2019 10:28 AM Todd Brown  MRN:  [id number]  Todd Brown is a 59yo M with h/o Schizophrenia, who was admitted to [facility] unit due to agitation.  Patient seen.  Chart reviewed. Patient discussed with nursing; no overnight events reported. BP remains high.  Subjective:  Patient reports "I am good", "my sleep is not good sometimes". He denies any other complaints - denies feeling depressed, anxious, suicidal, homicidal, paranoid, denies hallucinations. His BP was high again today, we discussed that third BP medicine will be added. Patient denies any physical complaints. He denies side effects from his medications.  Principal Problem: Schizophrenia ([place]) Diagnosis: Principal Problem:   Schizophrenia (Todd Brown) Active Problems:   Essential hypertension, benign   Chronic kidney disease   Hepatitis C   End stage renal disease (Todd Brown)   Dialysis patient ([place])  Total Time spent with patient: 15 minutes  Past Psychiatric History: see H&P  Past Medical History:  Past Medical History:  Diagnosis Date  . Anemia   . Diabetes mellitus without complication ([place])   . Dialysis patient [facility])    Mon. -Wed.- Fri  . DVT (deep venous thrombosis) ([place])    cephalic and basolic vein thrombosis  . ESRD (end stage renal disease) ([place])   . Hyperlipidemia   . Malignant hypertension   . Renal artery stenosis ([place])   . Schizophrenia [facility])     Past Surgical History:  Procedure Laterality Date  . A/V FISTULAGRAM N/A 08/06/2016   Procedure: A/V Fistulagram;  Surgeon: Todd Huxley, Todd;  Location: [place] CV LAB;  Service: Cardiovascular;  Laterality: N/A;  . A/V SHUNT INTERVENTION N/A 08/06/2016   Procedure: A/V Shunt Intervention;  Surgeon: Todd Huxley, Todd;  Location: [place] CV LAB;  Service: Cardiovascular;  Laterality: N/A;  . AV FISTULA PLACEMENT Left 12/26/2014   Procedure: ARTERIOVENOUS (AV) FISTULA CREATION;  Surgeon: Todd Huxley, Todd;  Location: [facility] ORS;  Service: Vascular;   Laterality: Left;  . ESOPHAGOGASTRODUODENOSCOPY (EGD) WITH PROPOFOL N/A 05/06/2017   Procedure: ESOPHAGOGASTRODUODENOSCOPY (EGD) WITH PROPOFOL;  Surgeon: Todd Bellows, Todd;  Location: [facility] ENDOSCOPY;  Service: Gastroenterology;  Laterality: N/A;  . INSERTION OF DIALYSIS CATHETER Right   . PERIPHERAL VASCULAR CATHETERIZATION N/A 10/22/2014   Procedure: Dialysis/Perma Catheter Insertion;  Surgeon: Todd Huxley, Todd;  Location: [place] CV LAB;  Service: Cardiovascular;  Laterality: N/A;  . PERIPHERAL VASCULAR CATHETERIZATION N/A 02/28/2015   Procedure: Dialysis/Perma Catheter Removal;  Surgeon: Todd Huxley, Todd;  Location: [place] CV LAB;  Service: Cardiovascular;  Laterality: N/A;  . Repair fx left lower leg     yrs ago (age 49)   Family History:  Family History  Problem Relation Age of Onset  . Diabetes Neg Hx    Family Psychiatric  History: see H&P Social History:  Social History   Substance and Sexual Activity  Alcohol Use No     Social History   Substance and Sexual Activity  Drug Use Yes  . Frequency: 1.0 times per week  . Types: Marijuana    Social History   Socioeconomic History  . Marital status: Single    Spouse name: Not on file  . Number of children: Not on file  . Years of education: Not on file  . Highest education level: Not on file  Occupational History  . Occupation: Disability   Tobacco Use  . Smoking status: Current Every Day Smoker    Packs/day: 0.50    Types: Cigarettes  .  Smokeless tobacco: Never Used  . Tobacco comment: 10 cigars a day  Substance and Sexual Activity  . Alcohol use: No  . Drug use: Yes    Frequency: 1.0 times per week    Types: Marijuana  . Sexual activity: Not Currently  Other Topics Concern  . Not on file  Social History Narrative  . Not on file   Social Determinants of Health   Financial Resource Strain: Low Risk   . Difficulty of Paying Living Expenses: Not hard at all  Food Insecurity: No Food Insecurity  .  Worried About Charity fundraiser in the Last Year: Never true  . Ran Out of Food in the Last Year: Never true  Transportation Needs: No Transportation Needs  . Lack of Transportation (Medical): No  . Lack of Transportation (Non-Medical): No  Physical Activity: Unknown  . Days of Exercise per Week: Patient refused  . Minutes of Exercise per Session: Patient refused  Stress: No Stress Concern Present  . Feeling of Stress : Not at all  Social Connections: Unknown  . Frequency of Communication with Friends and Family: Patient refused  . Frequency of Social Gatherings with Friends and Family: Patient refused  . Attends Religious Services: Patient refused  . Active Member of Clubs or Organizations: Patient refused  . Attends Archivist Meetings: Patient refused  . Marital Status: Patient refused   Additional Social History:                         Sleep: Fair  Appetite:  Good  Current Medications: Current Facility-Administered Medications  Medication Dose Route Frequency Provider Last Rate Last Admin  . acetaminophen (TYLENOL) tablet 500 mg  500 mg Oral Q4H PRN [place] Sauger, NP   500 mg at 10/08/19 7371  . albuterol (PROVENTIL) (2.5 MG/3ML) 0.083% nebulizer solution 2.5 mg  2.5 mg Inhalation Q4H PRN Todd Sauger, NP   2.5 mg at 10/06/19 2154  . amLODipine (NORVASC) tablet 10 mg  10 mg Oral Daily Brown, Todd Reno, Todd   10 mg at 10/08/19 0706  . calcium acetate (PHOSLO) capsule 1,334 mg  1,334 mg Oral Todd Brown WC Todd Sauger, NP   1,334 mg at 10/08/19 0836  . carbamazepine (TEGRETOL XR) 12 hr tablet 1,000 mg  1,000 mg Oral Todd Brown, Geni Bers, NP   1,000 mg at 10/08/19 0626  . Chlorhexidine Gluconate Cloth 2 % PADS 6 each  6 each Topical Q0600 Todd Iba, Todd      . cholecalciferol (VITAMIN D3) tablet 1,000 Units  1,000 Units Oral Daily Todd Sauger, NP   1,000 Units at 10/08/19 0840  . cloNIDine (CATAPRES) tablet 0.2 mg  0.2 mg  Oral Todd Brown Brown, Todd Brown, Todd   0.2 mg at 10/08/19 0840  . docusate sodium (COLACE) capsule 100 mg  100 mg Oral BID Brown, Todd Reno, Todd   100 mg at 10/08/19 0839  . ferrous sulfate tablet 325 mg  325 mg Oral Daily Todd Sauger, NP   325 mg at 10/08/19 0840  . fluPHENAZine (PROLIXIN) tablet 2.5 mg  2.5 mg Oral BID Todd Sauger, NP   2.5 mg at 10/08/19 0837  . heparin injection 1,400 Units  20 Units/kg Dialysis PRN Todd Iba, Todd      . mometasone-formoterol (DULERA) 200-5 MCG/ACT inhaler 2 puff  2 puff Inhalation BID Todd Sauger, NP   2 puff at 10/08/19 [id number]  . multivitamin (RENA-VIT) tablet 1 tablet  1 tablet  Oral Daily Todd Sauger, NP   1 tablet at 10/08/19 0837  . nicotine (NICODERM CQ - dosed in mg/24 hours) patch 21 mg  21 mg Transdermal Daily Brown, Todd Reno, Todd   21 mg at 10/08/19 0840  . pantoprazole (PROTONIX) EC tablet 40 mg  40 mg Oral Daily Todd Sauger, NP   40 mg at 10/08/19 0836  . senna (SENOKOT) tablet 17.2 mg  2 tablet Oral Daily PRN Brown, Todd Brown, Todd      . temazepam (RESTORIL) capsule 15 mg  15 mg Oral QHS Brown, Todd Reno, Todd   15 mg at 10/07/19 2115  . traZODone (DESYREL) tablet 50 mg  50 mg Oral Once PRN Larita Fife, Todd      . trihexyphenidyl (ARTANE) tablet 2 mg  2 mg Oral BID Todd Sauger, NP   2 mg at 10/08/19 4008    Lab Results:  No results found for this or any previous visit (from the past 48 hour(s)).  Blood Alcohol level:  Lab Results  Component Value Date   ETH <10 10/04/2019   ETH <5 67/61/9509    Metabolic Disorder Labs: Lab Results  Component Value Date   HGBA1C 5.0 09/12/2018   MPG 97 09/12/2018   No results found for: PROLACTIN Lab Results  Component Value Date   CHOL 186 10/06/2019   TRIG 75 10/06/2019   HDL 77 10/06/2019   CHOLHDL 2.4 10/06/2019   VLDL 15 10/06/2019   LDLCALC 94 10/06/2019    Physical Findings: AIMS:  , ,  ,  ,    CIWA:    COWS:     Musculoskeletal: Strength &  Muscle Tone: within normal limits Gait & Station: normal Patient leans: N/A  Psychiatric Specialty Exam: Physical Exam   Review of Systems   Blood pressure (!) 190/99, pulse 70, temperature 97.9 F (36.6 C), temperature source Oral, resp. rate 18, height 6\' 3"  (1.905 m), weight 69.4 kg, SpO2 94 %.Body mass index is 19.12 kg/m.  General Appearance: Disheveled  Eye Contact:  Good  Speech:  Garbled  Volume:  Increased  Mood:  Euthymic  Affect:  Appropriate and Congruent  Thought Process:  Coherent  Orientation:  Full (Time, Place, and Person)  Thought Content:  Logical  Suicidal Thoughts:  No  Homicidal Thoughts:  No  Memory:  Immediate;   Fair Recent;   Fair Remote;   NA  Judgement:  Fair  Insight:  Shallow  Psychomotor Activity:  Normal  Concentration:  Concentration: Fair and Attention Span: Fair  Recall:  [employer] of Knowledge:  NA  Language:  Fair  Akathisia:  No  Handed:  Right  AIMS (if indicated):     Assets:  Desire for Improvement Housing Social Support  ADL's:  Impaired  Cognition:  Impaired,  Mild  Sleep:  Number of Hours: 6.5     Treatment Plan Summary: Daily contact with patient to assess and evaluate symptoms and progress in treatment and Medication management  59yo M with h/o Schizophrenia, who was admitted to [facility] unit due to reported agitation at outpatient dialysis facility. At the unit patient is calm, cooperative, compliant with medications and dialysis and does not demonstrate any unsafe, threatening or agitated behaviors. His recent agitated behavior might be related to some type of physical discomfort, especially taking in consideration the fact that the patient cannot always communicate his needs effectively due to TD and garbled speech. At there moment, patient appears to be stable and is likely  at his baseline. His psych medications will be continued without changes today: Carbamazepine 1000mg  PO QAM for mood stabilization, Prolixin 2.5mg  PO BID  for psychosis and Restoril 15mg  PO QHS for sleep as well as Cogentin for TD. His BP continues to stay high and that is very concerning. He is currently on two antihypertensive medications: Norvasc 10mg  and Clonidine 0.2mg  PO Todd Brown. Per chart review, patient was on 4 blood pressure medicines at some time in the past. Today I will increase the dose of Clonidine to 0.3mg  PO Todd Brown (patient was on that dose in 2020 upon hospital discharge) and restart Hydralazine in dose 25mg  PO BID for now (patient used to be on 100mg  BID in 2020). Patient has an ACT team and their plan is to move him into the [place] at discharge.  Larita Fife, Todd 10/08/2019, 10:28 AM

## 2019-10-08 NOTE — Plan of Care (Signed)
The patient was cooperative and pleasant.  Problem: Coping: Goal: Ability to verbalize frustrations and anger appropriately will improve Outcome: Progressing Goal: Ability to demonstrate self-control will improve Outcome: Progressing

## 2019-10-08 NOTE — Progress Notes (Signed)
F - Manage Psychosis  D - The patient stayed in his room for the majority of the day, but was pleasant upon contact and behaviorally appropriate.  No symptoms of psychosis observed or endorsed.  Todd Brown was stable on his feet, but remains a HIGH FALL RISK.  He accepted medications as ordered and did not voice any concerns.  [name] was able to advocate for himself.  Blood pressure remains elevated.    A - [name] was provided emotional support, ordered medications, and opportunities to discuss issues.  Increases in HTN medications ordered and provided.  R - Continue with care.

## 2019-10-08 NOTE — B[facility] Group Notes (Signed)
[place] LCSW Group Therapy Note  10/08/2019    Type of Therapy and Topic:  Group Therapy:  A Hero Worthy of Support  Participation Level:  Did Not Attend   Description of Group:  Patients in this group were introduced to the concept that additional supports including self-support are an essential part of recovery.  Matching needs with supports to help fulfill those needs was explained.  Establishing boundaries that can gradually be increased or decreased was described, with patients giving their own examples of establishing appropriate boundaries in their lives.  A song entitled "My Own Hero" was played and a group discussion ensued in which patients stated it inspired them to help themselves in order to succeed, because other people cannot achieve their goals such as sobriety or stability for them.  A song was played called "I Am Enough" which led to a discussion about being willing to believe we are worth the effort of being a self-support.   Therapeutic Goals: 1)  demonstrate the importance of being a key part of one's own support system 2)  discuss various available supports 3)  encourage patient to use music as part of their self-support and focus on goals 4)  elicit ideas from patients about supports that need to be added   Summary of Patient Progress:  The patient was invited to participate but opted not to do so.  Therapeutic Modalities:   Motivational Interviewing Activity  Maretta Los

## 2019-10-09 ENCOUNTER — Inpatient Hospital Stay: Payer: Medicaid Other

## 2019-10-09 LAB — PHOSPHORUS: Phosphorus: 1.8 mg/dL — ABNORMAL LOW (ref 2.5–4.6)

## 2019-10-09 LAB — TSH: TSH: 24.428 u[IU]/mL — ABNORMAL HIGH (ref 0.350–4.500)

## 2019-10-09 LAB — CARBAMAZEPINE LEVEL, TOTAL: Carbamazepine Lvl: 4 ug/mL (ref 4.0–12.0)

## 2019-10-09 MED ORDER — LEVOTHYROXINE SODIUM 50 MCG PO TABS
50.0000 ug | ORAL_TABLET | Freq: Every day | ORAL | Status: DC
  Administered 2019-10-09 – 2019-10-11 (×3): 50 ug via ORAL
  Filled 2019-10-09 (×3): qty 1

## 2019-10-09 MED ORDER — CLONIDINE HCL 0.1 MG PO TABS
0.1000 mg | ORAL_TABLET | ORAL | Status: AC
  Administered 2019-10-09: 0.1 mg via ORAL
  Filled 2019-10-09: qty 1

## 2019-10-09 NOTE — Plan of Care (Signed)
Patient pleasant and cooperative on the unit  Problem: Education: Goal: Emotional status will improve Outcome: Progressing Goal: Mental status will improve Outcome: Progressing

## 2019-10-09 NOTE — Progress Notes (Signed)
This Probation officer held patient's BP medication, per Dialysis unit, they will be administered upon return to the unit.

## 2019-10-09 NOTE — Progress Notes (Signed)
[facility] MD Progress Note  10/09/2019 2:32 PM Todd Brown  MRN:  [id number] Subjective: Patient is a 59 year old male with a past psychiatric history significant for schizophrenia as well as a past medical history significant for chronic renal disease and several other issues who was referred to the emergency department by his dialysis team out of concern for poor self-care.  The patient had apparently missed 2 weeks of dialysis sessions and appeared to be agitated and aggressive.  He is followed by ACTT service and they have been increasingly concerned about his physical issues.  He was admitted to the psychiatric hospital for evaluation and stabilization.  Objective: Patient is seen and examined.  Patient is a 59 year old male with the above-stated past psychiatric history who is seen in follow-up.  The chart was reviewed.  The patient was at dialysis early this morning.  His blood pressure has been significantly elevated during the course of the hospitalization.  Review of the chart revealed that his ACTT service is attempting to get him into some form of assisted living or nursing home environment.  Review of the electronic medical record revealed that he denied any suicidal homicidal, auditory or visual hallucinations yesterday.  On my examination today the patient is alert and oriented really to 1.  He stated initially he thought it was 2019, but with some questioning was able to realize it was 2021.  His fund of knowledge is significantly decreased.  He was unable to recall any presidents.  His ability to recite numbers is very poor.  Part of this may be educationally related, but I think given his hypertension history as well as his dialysis issues he most likely has some form of vascular dementia.  Review of the electronic medical record did not reveal any neurological evaluations.  He has seen cardiology, gastroenterology, nephrology.  Review of imaging available in the chart showed no previous  imaging of his carotid arteries or brain.  He has a diagnosis of hypothyroidism back in 2008.  His last TSH that was checked in our system was December and 2018 and it was 10.625.  He does not have a TSH checked in quite a while.  Despite his dialysis and significant antihypertensive medication he remains significantly hypertensive.  Postdialysis his pressure is 183/95.  His pulse is at 71.  He is afebrile.  He did sleep 7.75 hours last night.  His Tegretol level on 5/5 was 4.7.  Liver function enzymes were normal.  His creatinine on 5.7 was 8.34.  His last EKG from 10/05/2019 does not appear to have been done.  Principal Problem: Schizophrenia ([place]) Diagnosis: Principal Problem:   Schizophrenia ([place]) Active Problems:   Essential hypertension, benign   Chronic kidney disease   Hepatitis C   End stage renal disease ([place])   Dialysis patient ([place])  Total Time spent with patient: 45 minutes  Past Psychiatric History: See admission H&P  Past Medical History:  Past Medical History:  Diagnosis Date  . Anemia   . Diabetes mellitus without complication ([place])   . Dialysis patient [facility])    Mon. -Wed.- Fri  . DVT (deep venous thrombosis) ([place])    cephalic and basolic vein thrombosis  . ESRD (end stage renal disease) ([place])   . Hyperlipidemia   . Malignant hypertension   . Renal artery stenosis ([place])   . Schizophrenia [facility])     Past Surgical History:  Procedure Laterality Date  . A/V FISTULAGRAM N/A 08/06/2016   Procedure: A/V Fistulagram;  Surgeon:  Algernon Huxley, MD;  Location: [place] [facility];  Service: Cardiovascular;  Laterality: N/A;  . A/V SHUNT INTERVENTION N/A 08/06/2016   Procedure: A/V Shunt Intervention;  Surgeon: Algernon Huxley, MD;  Location: [place] [facility];  Service: Cardiovascular;  Laterality: N/A;  . AV FISTULA PLACEMENT Left 12/26/2014   Procedure: ARTERIOVENOUS (AV) FISTULA CREATION;  Surgeon: Algernon Huxley, MD;  Location: [facility] ORS;  Service: Vascular;  Laterality: Left;  .  ESOPHAGOGASTRODUODENOSCOPY (EGD) WITH PROPOFOL N/A 05/06/2017   Procedure: ESOPHAGOGASTRODUODENOSCOPY (EGD) WITH PROPOFOL;  Surgeon: Jonathon Bellows, MD;  Location: [facility] ENDOSCOPY;  Service: Gastroenterology;  Laterality: N/A;  . INSERTION OF DIALYSIS CATHETER Right   . PERIPHERAL VASCULAR CATHETERIZATION N/A 10/22/2014   Procedure: Dialysis/Perma Catheter Insertion;  Surgeon: Algernon Huxley, MD;  Location: [place] [facility];  Service: Cardiovascular;  Laterality: N/A;  . PERIPHERAL VASCULAR CATHETERIZATION N/A 02/28/2015   Procedure: Dialysis/Perma Catheter Removal;  Surgeon: Algernon Huxley, MD;  Location: [place] [facility];  Service: Cardiovascular;  Laterality: N/A;  . Repair fx left lower leg     yrs ago (age 48)   Family History:  Family History  Problem Relation Age of Onset  . Diabetes Neg Hx    Family Psychiatric  History: See admission H&P Social History:  Social History   Substance and Sexual Activity  Alcohol Use No     Social History   Substance and Sexual Activity  Drug Use Yes  . Frequency: 1.0 times per week  . Types: Marijuana    Social History   Socioeconomic History  . Marital status: Single    Spouse name: Not on file  . Number of children: Not on file  . Years of education: Not on file  . Highest education level: Not on file  Occupational History  . Occupation: Disability   Tobacco Use  . Smoking status: Current Every Day Smoker    Packs/day: 0.50    Types: Cigarettes  . Smokeless tobacco: Never Used  . Tobacco comment: 10 cigars a day  Substance and Sexual Activity  . Alcohol use: No  . Drug use: Yes    Frequency: 1.0 times per week    Types: Marijuana  . Sexual activity: Not Currently  Other Topics Concern  . Not on file  Social History Narrative  . Not on file   Social Determinants of Health   Financial Resource Strain:   . Difficulty of Paying Living Expenses:   Food Insecurity:   . Worried About Charity fundraiser in the Last Year:   .  Arboriculturist in the Last Year:   Transportation Needs:   . Film/video editor (Medical):   Marland Kitchen Lack of Transportation (Non-Medical):   Physical Activity:   . Days of Exercise per Week:   . Minutes of Exercise per Session:   Stress:   . Feeling of Stress :   Social Connections:   . Frequency of Communication with Friends and Family:   . Frequency of Social Gatherings with Friends and Family:   . Attends Religious Services:   . Active Member of Clubs or Organizations:   . Attends Archivist Meetings:   Marland Kitchen Marital Status:    Additional Social History:                         Sleep: Good  Appetite:  Fair  Current Medications: Current Facility-Administered Medications  Medication Dose Route Frequency  Provider Last Rate Last Admin  . acetaminophen (TYLENOL) tablet 500 mg  500 mg Oral Q4H PRN Caroline Sauger, NP   500 mg at 10/09/19 3235  . albuterol (PROVENTIL) (2.5 MG/3ML) 0.083% nebulizer solution 2.5 mg  2.5 mg Inhalation Q4H PRN Caroline Sauger, NP   2.5 mg at 10/06/19 2154  . amLODipine (NORVASC) tablet 10 mg  10 mg Oral Daily Clapacs, Madie Reno, MD   Stopped at 10/09/19 [phone]  . calcium acetate (PHOSLO) capsule 1,334 mg  1,334 mg Oral TID WC Caroline Sauger, NP   1,334 mg at 10/09/19 0826  . carbamazepine (TEGRETOL XR) 12 hr tablet 1,000 mg  1,000 mg Oral [place] Holstein, Geni Bers, NP   1,000 mg at 10/09/19 0826  . Chlorhexidine Gluconate Cloth 2 % PADS 6 each  6 each Topical Q0600 Murlean Iba, MD      . cholecalciferol (VITAMIN D3) tablet 1,000 Units  1,000 Units Oral Daily Caroline Sauger, NP   1,000 Units at 10/09/19 0826  . cloNIDine (CATAPRES) tablet 0.3 mg  0.3 mg Oral TID Larita Fife, MD   0.3 mg at 10/09/19 2025  . docusate sodium (COLACE) capsule 100 mg  100 mg Oral BID Clapacs, Madie Reno, MD   100 mg at 10/09/19 0825  . ferrous sulfate tablet 325 mg  325 mg Oral Daily Caroline Sauger, NP   325 mg at 10/09/19 0826  .  fluPHENAZine (PROLIXIN) tablet 2.5 mg  2.5 mg Oral BID Caroline Sauger, NP   2.5 mg at 10/09/19 0825  . heparin injection 1,400 Units  20 Units/kg Dialysis PRN Murlean Iba, MD      . hydrALAZINE (APRESOLINE) tablet 25 mg  25 mg Oral BID Larita Fife, MD   Stopped at 10/09/19 0836  . mometasone-formoterol (DULERA) 200-5 MCG/ACT inhaler 2 puff  2 puff Inhalation BID Caroline Sauger, NP   2 puff at 10/09/19 0830  . multivitamin (RENA-VIT) tablet 1 tablet  1 tablet Oral Daily Caroline Sauger, NP   1 tablet at 10/09/19 0826  . nicotine (NICODERM CQ - dosed in mg/24 hours) patch 21 mg  21 mg Transdermal Daily Clapacs, Madie Reno, MD   21 mg at 10/09/19 0827  . pantoprazole (PROTONIX) EC tablet 40 mg  40 mg Oral Daily Caroline Sauger, NP   40 mg at 10/09/19 0825  . senna (SENOKOT) tablet 17.2 mg  2 tablet Oral Daily PRN Clapacs, John T, MD      . temazepam (RESTORIL) capsule 15 mg  15 mg Oral QHS Clapacs, Madie Reno, MD   15 mg at 10/08/19 2112  . traZODone (DESYREL) tablet 50 mg  50 mg Oral Once PRN Larita Fife, MD      . trihexyphenidyl (ARTANE) tablet 2 mg  2 mg Oral BID Caroline Sauger, NP   2 mg at 10/09/19 0825    Lab Results:  Results for orders placed or performed during the hospital encounter of 10/04/19 (from the past 48 hour(s))  Phosphorus     Status: Abnormal   Collection Time: 10/09/19 12:56 PM  Result Value Ref Range   Phosphorus 1.8 (L) 2.5 - 4.6 mg/dL    Comment: Performed at [facility], [address], [place], [place] [address]    Blood Alcohol level:  Lab Results  Component Value Date   [facility] <10 10/04/2019   ETH <5 23/76/2831    Metabolic Disorder Labs: Lab Results  Component Value Date   HGBA1C 5.0 09/12/2018   MPG 97 09/12/2018  No results found for: PROLACTIN Lab Results  Component Value Date   CHOL 186 10/06/2019   TRIG 75 10/06/2019   HDL 77 10/06/2019   CHOLHDL 2.4 10/06/2019   VLDL 15 10/06/2019   LDLCALC 94 10/06/2019     Physical Findings: AIMS:  , ,  ,  ,    CIWA:    COWS:     Musculoskeletal: Strength & Muscle Tone: decreased Gait & Station: shuffle Patient leans: N/A  Psychiatric Specialty Exam: Physical Exam  Nursing note and vitals reviewed. HENT:  Head: Normocephalic and atraumatic.  Neurological: He is alert.    Review of Systems  Blood pressure (!) 183/95, pulse 71, temperature 97.8 F (36.6 C), temperature source Oral, resp. rate 14, height 6\' 3"  (1.905 m), weight 69.4 kg, SpO2 99 %.Body mass index is 19.12 kg/m.  General Appearance: Disheveled  Eye Contact:  Fair  Speech:  Garbled  Volume:  Increased  Mood:  Anxious  Affect:  Congruent  Thought Process:  Goal Directed and Descriptions of Associations: Circumstantial  Orientation:  Negative  Thought Content:  Rumination  Suicidal Thoughts:  No  Homicidal Thoughts:  No  Memory:  Immediate;   Poor Recent;   Poor Remote;   Poor  Judgement:  Impaired  Insight:  Fair  Psychomotor Activity:  Increased  Concentration:  Concentration: Fair and Attention Span: Poor  Recall:  Poor  Fund of Knowledge:  Poor  Language:  Fair  Akathisia:  Negative  Handed:  Right  AIMS (if indicated):     Assets:  Desire for Improvement Resilience  ADL's:  Impaired  Cognition:  Impaired,  Severe  Sleep:  Number of Hours: 7.75     Treatment Plan Summary: Daily contact with patient to assess and evaluate symptoms and progress in treatment, Medication management and Plan : Patient is seen and examined.  Patient is a 59 year old male with the above-stated past medical and psychiatric history who is seen in follow-up.   Diagnosis: #1 schizophrenia, #2 chronic renal disease/chronic renal failure/end-stage renal disease, #3 malignant hypertension, #4 diabetes mellitus, #5 history of DVT, #6 renal artery stenosis, #7 hypothyroidism, #8 probable vascular dementia  Patient is seen in follow-up.  At least from what I can tell from the notes as well as  the nursing reviews he is at his baseline with regard to his schizophrenia.  I am not sure whether or not the dementia component has been recognized.  Given his history of vascular disease and renal artery stenosis I am going to order a noncontrasted CT scan of the head.  I am sure it is basically gone to show some atrophy and microvascular disease if not blatant strokes.  His last CT scan of the head was in 2018 that showed no acute intracranial abnormality.  There was mild brain parenchymal volume loss at that time.  I do not see any carotid artery studies, and I will order those as well.  It is also unclear how much his hypothyroidism may be contributing to what is going on currently with regard to his physical state.  I will order a TSH to be done on previously drawn blood.  I have asked social work to contact his ACTT service and let them know my concern for possible dementia and guardianship issues that will need to be taken care of.  Until we get back his carotid ultrasound I am not going to change any of his antihypertensive medications.  I will let renal take care of that,  but I am concerned if we drop his pressure significantly and he does have vascular disease we could potentially cause a stroke.  I suspect that he has a long history of noncompliance because of his memory issues.  He was admitted on 5/5, and his Tegretol level at that time was 4.7.  I am going to repeat that just in case.  If it was at 5.7 with some degree of noncompliance I am concerned that some of his gait abnormality may be related to Tegretol toxicity.  1.  Continue albuterol as needed every 4 hours for COPD and shortness of breath. 2.  Continue amlodipine 10 mg p.o. daily for hypertension. 3.  Continue calcium acetate 1,334 mg secondary to dialysis and renal failure. 4.  Continue Tegretol extended release 1000 mg p.o. daily for mood stability. 5.  Continue vitamin D3 1000 units p.o. daily for vitamin D deficiency. 6.   Continue clonidine 0.3 mg p.o. 3 times daily for hypertension. 7.  Continue Colace 100 mg p.o. twice daily for constipation. 8.  Continue iron sulfate 325 mg p.o. daily for anemia. 9.  Continue Prolixin 2.5 mg p.o. twice daily for schizophrenia. 10.  Continue heparin injection as directed. 11.  Continue hydralazine 25 mg p.o. twice daily for hypertension. 12.  Continue Dulera 2 puffs twice daily for COPD. 13.  Continue multivitamin for renal failure 1 tablet p.o. daily. 14.  Continue Protonix 40 mg p.o. daily for gastric protection. 15.  Continue Senokot 17.2 g daily as needed constipation. 16.  Continue temazepam 15 mg p.o. nightly for insomnia. 16.  Continue trazodone 50 mg p.o. nightly as needed insomnia. 17.  Continue Artane 2 mg p.o. twice daily for side effects of medication. 18.  CT scan of the head without contrast secondary to concern for cerebrovascular disease. 19.  Carotid Doppler ultrasounds for vascular disease and possible cerebrovascular disease. 20.  Check Tegretol level. 21. check TSH. 22. Contact ACTT service about guardianship issues. 23.  Disposition planning-in progress.  Sharma Covert, MD 10/09/2019, 2:32 PM

## 2019-10-09 NOTE — Progress Notes (Signed)
Patient was in the day room upon arrival to the unit. Patient presents without psychotic symptoms and observed interacting appropriately with staff and peers. Pt compliant with medication administration per MD orders. Pt has hx of HTN and BP remains elevated, see MAR. This Probation officer will continue to monitor. Patient given education, support and encouragement to be active in his treatment plan. Patient being monitored Q 15 minutes for safety per unit protocol. Patient remains safe on the unit.

## 2019-10-09 NOTE — BHH Counselor (Signed)
CSW spoke with Cassell Smiles and Delphina at [facility] who report they are still working on an ALF for the pt. They report they will contact the pt today to discuss with him going to an ALF.

## 2019-10-09 NOTE — BHH Counselor (Signed)
CSW relayed to ACT Team(Delphina) physician concerns about pt possibly having dementia and not being able to care for himself. Delphina states the team will contact DSS to pursue guardianship.

## 2019-10-09 NOTE — Plan of Care (Signed)
D- Patient alert and oriented to person and situation. Patient presents in a pleasant mood on assessment reporting that he slept good last night and had no complaints to voice to this Probation officer. Patient's speech is mostly slurred and he's hard to comprehend at times. Patient denies SI, HI, AVH, and pain at this time stating "I ain't never want to hurt nobody". Patient also denies any depression/anxiety, reporting "I don't worry about anything". Patient had no stated goals for today.  A- Scheduled medications administered to patient, per MD orders. Support and encouragement provided.  Routine safety checks conducted every 15 minutes.  Patient informed to notify staff with problems or concerns.  R- No adverse drug reactions noted. Patient contracts for safety at this time. Patient compliant with medications and treatment plan. Patient receptive, calm, and cooperative. Patient remains safe at this time.  Problem: Education: Goal: Knowledge of [facility] Education information/materials will improve Outcome: Progressing Goal: Emotional status will improve Outcome: Progressing Goal: Mental status will improve Outcome: Progressing Goal: Verbalization of understanding the information provided will improve Outcome: Progressing   Problem: Activity: Goal: Interest or engagement in activities will improve Outcome: Progressing Goal: Sleeping patterns will improve Outcome: Progressing   Problem: Coping: Goal: Ability to verbalize frustrations and anger appropriately will improve Outcome: Progressing Goal: Ability to demonstrate self-control will improve Outcome: Progressing   Problem: Health Behavior/Discharge Planning: Goal: Identification of resources available to assist in meeting health care needs will improve Outcome: Progressing Goal: Compliance with treatment plan for underlying cause of condition will improve Outcome: Progressing   Problem: Physical Regulation: Goal: Ability to  maintain clinical measurements within normal limits will improve Outcome: Progressing   Problem: Safety: Goal: Periods of time without injury will increase Outcome: Progressing   Problem: Activity: Goal: Will identify at least one activity in which they can participate Outcome: Progressing   Problem: Coping: Goal: Ability to identify and develop effective coping behavior will improve Outcome: Progressing Goal: Ability to interact with others will improve Outcome: Progressing Goal: Demonstration of participation in decision-making regarding own care will improve Outcome: Progressing Goal: Ability to use eye contact when communicating with others will improve Outcome: Progressing   Problem: Health Behavior/Discharge Planning: Goal: Identification of resources available to assist in meeting health care needs will improve Outcome: Progressing   Problem: Self-Concept: Goal: Will verbalize positive feelings about self Outcome: Progressing

## 2019-10-09 NOTE — Plan of Care (Signed)
Patient denies SI/HI/AVH, presents calm and pleasant   Problem: Education: Goal: Emotional status will improve Outcome: Progressing Goal: Mental status will improve Outcome: Progressing

## 2019-10-09 NOTE — Progress Notes (Signed)
[facility]  ROUNDING NOTE   Subjective:  Patient underwent hemodialysis treatment today. Tolerated quite well. Appears to be in better spirits at the moment.   Objective:  Vital signs in last 24 hours:  Temp:  [97.8 F (36.6 C)-97.9 F (36.6 C)] 97.8 F (36.6 C) (05/10 0930) Pulse Rate:  [60-87] 71 (05/10 1300) Resp:  [12-19] 14 (05/10 1300) BP: (155-206)/(83-126) 183/95 (05/10 1300) SpO2:  [98 %-99 %] 99 % (05/10 0930)  Weight change:  Filed Weights   10/04/19 2346 10/06/19 0915  Weight: 69.4 kg 69.4 kg    Intake/Output: No intake/output data recorded.   Intake/Output this shift:  No intake/output data recorded.  Physical Exam: General: No acute distress  Head: Normocephalic, atraumatic. Moist oral mucosal membranes  Eyes: Anicteric  Neck: Supple, trachea midline  Lungs:  Clear to auscultation, normal effort  Heart: S1S2 no rubs  Abdomen:  Soft, nontender, bowel sounds present  Extremities: 1+ peripheral edema.  Neurologic: Awake, alert, following commands  Skin: No lesions  Access: LUE AVF    Basic Metabolic Panel: Recent Labs  Lab 10/04/19 1545 10/06/19 0645 10/09/19 1256  NA 136 139  --   K 3.3* 4.4  --   CL 99 99  --   CO2 27 27  --   GLUCOSE 86 76  --   BUN 26* 65*  --   CREATININE 4.45* 8.34*  --   CALCIUM 9.2 10.1  --   PHOS  --  5.2* 1.8*    Liver Function Tests: Recent Labs  Lab 10/04/19 1545 10/06/19 0645  AST 30  --   ALT 24  --   ALKPHOS 81  --   BILITOT 0.6  --   PROT 7.4  --   ALBUMIN 3.5 3.5   No results for input(s): LIPASE, AMYLASE in the last 168 hours. No results for input(s): AMMONIA in the last 168 hours.  CBC: Recent Labs  Lab 10/04/19 1545  WBC 8.0  HGB 11.5*  HCT 35.0*  MCV 100.6*  PLT 269    Cardiac Enzymes: No results for input(s): CKTOTAL, CKMB, CKMBINDEX, TROPONINI in the last 168 hours.  BNP: Invalid input(s): POCBNP  CBG: No results for input(s): GLUCAP in the last 168  hours.  Microbiology: Results for orders placed or performed during the hospital encounter of 10/04/19  Respiratory Panel by RT PCR (Flu A&B, Covid) - Nasopharyngeal Swab     Status: None   Collection Time: 10/04/19  6:26 PM   Specimen: Nasopharyngeal Swab  Result Value Ref Range Status   SARS Coronavirus 2 by RT PCR NEGATIVE NEGATIVE Final    Comment: (NOTE) SARS-CoV-2 target nucleic acids are NOT DETECTED. The SARS-CoV-2 RNA is generally detectable in upper respiratoy specimens during the acute phase of infection. The lowest concentration of SARS-CoV-2 viral copies this assay can detect is 131 copies/mL. A negative result does not preclude SARS-Cov-2 infection and should not be used as the sole basis for treatment or other patient management decisions. A negative result may occur with  improper specimen collection/handling, submission of specimen other than nasopharyngeal swab, presence of viral mutation(s) within the areas targeted by this assay, and inadequate number of viral copies (<131 copies/mL). A negative result must be combined with clinical observations, patient history, and epidemiological information. The expected result is Negative. Fact Sheet for Patients:  PinkCheek.be Fact Sheet for Healthcare Providers:  GravelBags.it This test is not yet ap proved or cleared by the [place] and  has been authorized for detection and/or diagnosis of SARS-CoV-2 by FDA under an Emergency Use Authorization (EUA). This EUA will remain  in effect (meaning this test can be used) for the duration of the COVID-19 declaration under Section 564(b)(1) of the Act, 21 U.S.C. section 360bbb-3(b)(1), unless the authorization is terminated or revoked sooner.    Influenza A by PCR NEGATIVE NEGATIVE Final   Influenza B by PCR NEGATIVE NEGATIVE Final    Comment: (NOTE) The Xpert Xpress SARS-CoV-2/FLU/RSV assay is intended as an  aid in  the diagnosis of influenza from Nasopharyngeal swab specimens and  should not be used as a sole basis for treatment. Nasal washings and  aspirates are unacceptable for Xpert Xpress SARS-CoV-2/FLU/RSV  testing. Fact Sheet for Patients: PinkCheek.be Fact Sheet for Healthcare Providers: GravelBags.it This test is not yet approved or cleared by the [place] FDA and  has been authorized for detection and/or diagnosis of SARS-CoV-2 by  FDA under an Emergency Use Authorization (EUA). This EUA will remain  in effect (meaning this test can be used) for the duration of the  Covid-19 declaration under Section 564(b)(1) of the Act, 21  U.S.C. section 360bbb-3(b)(1), unless the authorization is  terminated or revoked. Performed at [facility], [address] [place], [place] [address]     Coagulation Studies: No results for input(s): LABPROT, INR in the last 72 hours.  Urinalysis: No results for input(s): COLORURINE, LABSPEC, PHURINE, GLUCOSEU, HGBUR, BILIRUBINUR, KETONESUR, PROTEINUR, UROBILINOGEN, NITRITE, LEUKOCYTESUR in the last 72 hours.  Invalid input(s): APPERANCEUR    Imaging: No results found.   Medications:    . amLODipine  10 mg Oral Daily  . calcium acetate  1,334 mg Oral TID WC  . carbamazepine  1,000 mg Oral BH-q7a  . Chlorhexidine Gluconate Cloth  6 each Topical Q0600  . cholecalciferol  1,000 Units Oral Daily  . cloNIDine  0.3 mg Oral TID  . docusate sodium  100 mg Oral BID  . ferrous sulfate  325 mg Oral Daily  . fluPHENAZine  2.5 mg Oral BID  . hydrALAZINE  25 mg Oral BID  . mometasone-formoterol  2 puff Inhalation BID  . multivitamin  1 tablet Oral Daily  . nicotine  21 mg Transdermal Daily  . pantoprazole  40 mg Oral Daily  . temazepam  15 mg Oral QHS  . trihexyphenidyl  2 mg Oral BID   acetaminophen, albuterol, heparin, senna, traZODone  Assessment/ Plan:  59 y.o. male  with past medical history of ESRD on HD MWF, hypertension, schizophrenia, hepatitis C, history of DVT, anemia chronic kidney disease, secondary hyperparathyroidism, chronic volume overload who presented with worsening schizophrenia.  [place] Left AVF  1.  ESRD on HD MWF.  Patient completed dialysis treatment today.  Tolerated well.  Next Alysis treatment for Wednesday if still here.  2.  Anemia chronic kidney disease.  Hemoglobin currently 11.5.  Hold off on Epogen at the moment.  3.  Secondary hyperparathyroidism.  Phosphorus checked mid treatment and low at 1.8.  Prior to this phosphorus was within acceptable range at 5.2.  Continue to periodically monitor.   LOS: 5 Money Mckeithan 5/10/20212:19 PM

## 2019-10-09 NOTE — BHH Group Notes (Signed)
LCSW Group Therapy Note   10/09/2019 3:11 PM  Type of Therapy and Topic:  Group Therapy:  Overcoming Obstacles   Participation Level:  Did Not Attend   Description of Group:    In this group patients will be encouraged to explore what they see as obstacles to their own wellness and recovery. They will be guided to discuss their thoughts, feelings, and behaviors related to these obstacles. The group will process together ways to cope with barriers, with attention given to specific choices patients can make. Each patient will be challenged to identify changes they are motivated to make in order to overcome their obstacles. This group will be process-oriented, with patients participating in exploration of their own experiences as well as giving and receiving support and challenge from other group members.   Therapeutic Goals: 1. Patient will identify personal and current obstacles as they relate to admission. 2. Patient will identify barriers that currently interfere with their wellness or overcoming obstacles.  3. Patient will identify feelings, thought process and behaviors related to these barriers. 4. Patient will identify two changes they are willing to make to overcome these obstacles:      Summary of Patient Progress X   Therapeutic Modalities:   Cognitive Behavioral Therapy Solution Focused Therapy Motivational Interviewing Relapse Prevention Therapy  Assunta Curtis, MSW, LCSW 10/09/2019 3:11 PM

## 2019-10-09 NOTE — Progress Notes (Signed)
This writer heard the bruit and felt the thrill of patient's left AV fistula during medication pass this morning.

## 2019-10-10 LAB — RPR: RPR Ser Ql: NONREACTIVE

## 2019-10-10 MED ORDER — HYDRALAZINE HCL 50 MG PO TABS
50.0000 mg | ORAL_TABLET | Freq: Two times a day (BID) | ORAL | Status: DC
  Administered 2019-10-10 – 2019-10-11 (×2): 50 mg via ORAL
  Filled 2019-10-10 (×3): qty 1

## 2019-10-10 NOTE — Progress Notes (Signed)
[facility] MD Progress Note  10/10/2019 11:22 AM Todd Brown  MRN:  [id number] Subjective:  Patient is a 59 year old male with a past psychiatric history significant for schizophrenia as well as a past medical history significant for chronic renal disease and several other issues who was referred to the emergency department by his dialysis team out of concern for poor self-care.  The patient had apparently missed 2 weeks of dialysis sessions and appeared to be agitated and aggressive.  He is followed by ACTT service and they have been increasingly concerned about his physical issues.  He was admitted to the psychiatric hospital for evaluation and stabilization.  Objective: Patient is seen and examined.  Patient is a 59 year old male with the above-stated past psychiatric history who is seen in follow-up.  He is essentially unchanged from yesterday.  His CT of his head revealed significant brain atrophy.  It was noted that there was significant atherosclerotic calcification of the major vessels at the base of the brain.  There was no sign of old or acute focal infarction.  His carotid ultrasound showed minimal bilateral atherosclerotic plaque resulting in less than 50% stenosis of the bilateral internal cerebral arteries.  Antegrade flow was noted in both vertebral arteries.  His repeat Tegretol level was 4, and his TSH was 24.4.  He was started on 50 mcg p.o. daily of levothyroxine yesterday.  His RPR is pending.  Social work contacted his ACTT service and they have begun the process of guardianship for him.  He denied any auditory or visual hallucinations.  He denied any suicidal or homicidal ideation.  His orientation remains unchanged from yesterday.  His blood pressure this morning is 192/102.  He is afebrile.  He slept 8 hours last night.  Principal Problem: Schizophrenia ([place]) Diagnosis: Principal Problem:   Schizophrenia ([place]) Active Problems:   Essential hypertension, benign   Chronic kidney  disease   Hepatitis C   End stage renal disease ([place])   Dialysis patient ([place])  Total Time spent with patient: 15 minutes  Past Psychiatric History: See admission H&P  Past Medical History:  Past Medical History:  Diagnosis Date  . Anemia   . Diabetes mellitus without complication ([place])   . Dialysis patient [facility])    Mon. -Wed.- Fri  . DVT (deep venous thrombosis) ([place])    cephalic and basolic vein thrombosis  . ESRD (end stage renal disease) ([place])   . Hyperlipidemia   . Malignant hypertension   . Renal artery stenosis ([place])   . Schizophrenia [facility])     Past Surgical History:  Procedure Laterality Date  . A/V FISTULAGRAM N/A 08/06/2016   Procedure: A/V Fistulagram;  Surgeon: Algernon Huxley, MD;  Location: [place] CV LAB;  Service: Cardiovascular;  Laterality: N/A;  . A/V SHUNT INTERVENTION N/A 08/06/2016   Procedure: A/V Shunt Intervention;  Surgeon: Algernon Huxley, MD;  Location: [place] CV LAB;  Service: Cardiovascular;  Laterality: N/A;  . AV FISTULA PLACEMENT Left 12/26/2014   Procedure: ARTERIOVENOUS (AV) FISTULA CREATION;  Surgeon: Algernon Huxley, MD;  Location: [facility] ORS;  Service: Vascular;  Laterality: Left;  . ESOPHAGOGASTRODUODENOSCOPY (EGD) WITH PROPOFOL N/A 05/06/2017   Procedure: ESOPHAGOGASTRODUODENOSCOPY (EGD) WITH PROPOFOL;  Surgeon: Jonathon Bellows, MD;  Location: [facility] ENDOSCOPY;  Service: Gastroenterology;  Laterality: N/A;  . INSERTION OF DIALYSIS CATHETER Right   . PERIPHERAL VASCULAR CATHETERIZATION N/A 10/22/2014   Procedure: Dialysis/Perma Catheter Insertion;  Surgeon: Algernon Huxley, MD;  Location: [place] CV LAB;  Service:  Cardiovascular;  Laterality: N/A;  . PERIPHERAL VASCULAR CATHETERIZATION N/A 02/28/2015   Procedure: Dialysis/Perma Catheter Removal;  Surgeon: Algernon Huxley, MD;  Location: [place] CV LAB;  Service: Cardiovascular;  Laterality: N/A;  . Repair fx left lower leg     yrs ago (age 69)   Family History:  Family History  Problem Relation  Age of Onset  . Diabetes Neg Hx    Family Psychiatric  History: See admission H&P Social History:  Social History   Substance and Sexual Activity  Alcohol Use No     Social History   Substance and Sexual Activity  Drug Use Yes  . Frequency: 1.0 times per week  . Types: Marijuana    Social History   Socioeconomic History  . Marital status: Single    Spouse name: Not on file  . Number of children: Not on file  . Years of education: Not on file  . Highest education level: Not on file  Occupational History  . Occupation: Disability   Tobacco Use  . Smoking status: Current Every Day Smoker    Packs/day: 0.50    Types: Cigarettes  . Smokeless tobacco: Never Used  . Tobacco comment: 10 cigars a day  Substance and Sexual Activity  . Alcohol use: No  . Drug use: Yes    Frequency: 1.0 times per week    Types: Marijuana  . Sexual activity: Not Currently  Other Topics Concern  . Not on file  Social History Narrative  . Not on file   Social Determinants of Health   Financial Resource Strain:   . Difficulty of Paying Living Expenses:   Food Insecurity:   . Worried About Charity fundraiser in the Last Year:   . Arboriculturist in the Last Year:   Transportation Needs:   . Film/video editor (Medical):   Marland Kitchen Lack of Transportation (Non-Medical):   Physical Activity:   . Days of Exercise per Week:   . Minutes of Exercise per Session:   Stress:   . Feeling of Stress :   Social Connections:   . Frequency of Communication with Friends and Family:   . Frequency of Social Gatherings with Friends and Family:   . Attends Religious Services:   . Active Member of Clubs or Organizations:   . Attends Archivist Meetings:   Marland Kitchen Marital Status:    Additional Social History:                         Sleep: Good  Appetite:  Good  Current Medications: Current Facility-Administered Medications  Medication Dose Route Frequency Provider Last Rate Last Admin   . acetaminophen (TYLENOL) tablet 500 mg  500 mg Oral Q4H PRN Caroline Sauger, NP   500 mg at 10/10/19 0836  . albuterol (PROVENTIL) (2.5 MG/3ML) 0.083% nebulizer solution 2.5 mg  2.5 mg Inhalation Q4H PRN Caroline Sauger, NP   2.5 mg at 10/06/19 2154  . amLODipine (NORVASC) tablet 10 mg  10 mg Oral Daily Clapacs, Madie Reno, MD   10 mg at 10/10/19 0823  . calcium acetate (PHOSLO) capsule 1,334 mg  1,334 mg Oral TID WC Caroline Sauger, NP   1,334 mg at 10/10/19 0820  . carbamazepine (TEGRETOL XR) 12 hr tablet 1,000 mg  1,000 mg Oral Chester Holstein, Geni Bers, NP   1,000 mg at 10/10/19 0617  . Chlorhexidine Gluconate Cloth 2 % PADS 6 each  6 each Topical Q0600  Murlean Iba, MD      . cholecalciferol (VITAMIN D3) tablet 1,000 Units  1,000 Units Oral Daily Caroline Sauger, NP   1,000 Units at 10/10/19 [phone]  . cloNIDine (CATAPRES) tablet 0.3 mg  0.3 mg Oral TID Larita Fife, MD   0.3 mg at 10/10/19 0617  . docusate sodium (COLACE) capsule 100 mg  100 mg Oral BID Clapacs, Madie Reno, MD   100 mg at 10/10/19 0823  . ferrous sulfate tablet 325 mg  325 mg Oral Daily Caroline Sauger, NP   325 mg at 10/10/19 6160  . fluPHENAZine (PROLIXIN) tablet 2.5 mg  2.5 mg Oral BID Caroline Sauger, NP   2.5 mg at 10/10/19 7371  . heparin injection 1,400 Units  20 Units/kg Dialysis PRN Murlean Iba, MD      . hydrALAZINE (APRESOLINE) tablet 50 mg  50 mg Oral BID Sharma Covert, MD      . levothyroxine (SYNTHROID) tablet 50 mcg  50 mcg Oral Q0600 Sharma Covert, MD   50 mcg at 10/10/19 [phone]  . mometasone-formoterol (DULERA) 200-5 MCG/ACT inhaler 2 puff  2 puff Inhalation BID Caroline Sauger, NP   2 puff at 10/10/19 [phone]  . multivitamin (RENA-VIT) tablet 1 tablet  1 tablet Oral Daily Caroline Sauger, NP   1 tablet at 10/10/19 0824  . nicotine (NICODERM CQ - dosed in mg/24 hours) patch 21 mg  21 mg Transdermal Daily Clapacs, John T, MD   21 mg at 10/10/19 0830  . pantoprazole  (PROTONIX) EC tablet 40 mg  40 mg Oral Daily Caroline Sauger, NP   40 mg at 10/10/19 0824  . senna (SENOKOT) tablet 17.2 mg  2 tablet Oral Daily PRN Clapacs, John T, MD      . temazepam (RESTORIL) capsule 15 mg  15 mg Oral QHS Clapacs, Madie Reno, MD   15 mg at 10/09/19 2115  . traZODone (DESYREL) tablet 50 mg  50 mg Oral Once PRN Larita Fife, MD      . trihexyphenidyl (ARTANE) tablet 2 mg  2 mg Oral BID Caroline Sauger, NP   2 mg at 10/10/19 4627    Lab Results:  Results for orders placed or performed during the hospital encounter of 10/04/19 (from the past 48 hour(s))  Phosphorus     Status: Abnormal   Collection Time: 10/09/19 12:56 PM  Result Value Ref Range   Phosphorus 1.8 (L) 2.5 - 4.6 mg/dL    Comment: Performed at [facility], [address] [place], [place] [address]  TSH     Status: Abnormal   Collection Time: 10/09/19 12:56 PM  Result Value Ref Range   TSH 24.428 (H) 0.350 - 4.500 uIU/mL    Comment: Performed by a 3rd Generation assay with a functional sensitivity of <=0.01 uIU/mL. Performed at [facility], [address] [place], [place] [address]   Carbamazepine level, total     Status: None   Collection Time: 10/09/19 12:56 PM  Result Value Ref Range   Carbamazepine Lvl 4.0 4.0 - 12.0 ug/mL    Comment: Performed at [facility], [address] [place], [place] [address]    Blood Alcohol level:  Lab Results  Component Value Date   [facility] <10 10/04/2019   ETH <5 16/96/7893    Metabolic Disorder Labs: Lab Results  Component Value Date   HGBA1C 5.0 09/12/2018   MPG 97 09/12/2018   No results found for: PROLACTIN Lab Results  Component Value Date  CHOL 186 10/06/2019   TRIG 75 10/06/2019   HDL 77 10/06/2019   CHOLHDL 2.4 10/06/2019   VLDL 15 10/06/2019   LDLCALC 94 10/06/2019    Physical Findings: AIMS:  , ,  ,  ,    CIWA:    COWS:     Musculoskeletal: Strength & Muscle Tone: decreased Gait & Station:  unsteady Patient leans: N/A  Psychiatric Specialty Exam: Physical Exam  Nursing note and vitals reviewed. Constitutional: He appears well-developed.  HENT:  Head: Normocephalic and atraumatic.  Respiratory: Effort normal.  Neurological: He is alert.    Review of Systems  Blood pressure (!) 192/102, pulse 85, temperature 98.1 F (36.7 C), temperature source Oral, resp. rate 17, height 6\' 3"  (1.905 m), weight 69.4 kg, SpO2 94 %.Body mass index is 19.12 kg/m.  General Appearance: Disheveled  Eye Contact:  Fair  Speech:  Normal Rate  Volume:  Normal  Mood:  Euthymic  Affect:  Congruent  Thought Process:  Goal Directed and Descriptions of Associations: Circumstantial  Orientation:  Negative  Thought Content:  Negative  Suicidal Thoughts:  No  Homicidal Thoughts:  No  Memory:  Immediate;   Poor Recent;   Poor Remote;   Poor  Judgement:  Fair  Insight:  Fair  Psychomotor Activity:  Normal  Concentration:  Concentration: Fair and Attention Span: Fair  Recall:  [employer] of Knowledge:  Fair  Language:  Fair  Akathisia:  Negative  Handed:  Right  AIMS (if indicated):     Assets:  Desire for Improvement Resilience  ADL's:  Impaired  Cognition:  Impaired,  Moderate  Sleep:  Number of Hours: 8     Treatment Plan Summary: Daily contact with patient to assess and evaluate symptoms and progress in treatment, Medication management and Plan : Patient is seen and examined.  Patient is a 59 year old male with the above-stated past medical and psychiatric history who is seen in follow-up.   Diagnosis: #1 schizophrenia, #2 chronic renal disease/chronic renal failure/end-stage renal disease, #3 malignant hypertension, #4 diabetes mellitus, #5 history of DVT, #6 renal artery stenosis, #7 hypothyroidism, #8 probable dementia  Patient is seen in follow-up.  He is essentially unchanged from yesterday.  The work-up for his dementia really is only revealed significant atrophy.  He does have  some degree of vascular disease but nothing that would completely explain his dementia.  If his blood pressures been is poorly controlled in addition to his mental illness and his underlying medical problems it could be multifactorial.  From a schizophrenia standpoint he is doing fine.  He continues on dialysis.  I have started levothyroxine 50 mcg p.o. daily.  His TSH was 12.  It will take at least 4 to 6 weeks before there is a real bump in his TSH.  We will not really be able to tell whether or not his hypothyroidism is affecting his cognitive status until it gets somewhat better.  His blood pressure remains elevated and now that I know that his carotid arteries do not have significant disease I am going to increase his hydralazine to 50 mg p.o. twice daily.  Hopefully that will bring it down a bit.  He is scheduled for dialysis tomorrow.  His ACTT service continues to try and find him in assisted living or nursing home situation.  No change in his medications today.  Hopefully will continue to improve.  From a psychiatric standpoint as soon as his housing situation is clarified he should be ready  to be discharged.  1.  Continue albuterol as needed every 4 hours for COPD and shortness of breath. 2.  Continue amlodipine 10 mg p.o. daily for hypertension. 3.  Continue calcium acetate 1,334 mg secondary to dialysis and renal failure. 4.  Continue Tegretol extended release 1000 mg p.o. daily for mood stability. 5.  Continue vitamin D3 1000 units p.o. daily for vitamin D deficiency. 6.  Continue clonidine 0.3 mg p.o. 3 times daily for hypertension. 7.  Continue Colace 100 mg p.o. twice daily for constipation. 8.  Continue iron sulfate 325 mg p.o. daily for anemia. 9.  Continue Prolixin 2.5 mg p.o. twice daily for schizophrenia. 10.  Continue heparin injection as directed. 11.  Increase hydralazine to 50 mg p.o. twice daily for hypertension. 12.  Continue Dulera 2 puffs twice daily for COPD. 13.  Continue  multivitamin for renal failure 1 tablet p.o. daily. 14.  Continue Protonix 40 mg p.o. daily for gastric protection. 15.  Continue Senokot 17.2 g daily as needed constipation. 16.  Continue temazepam 15 mg p.o. nightly for insomnia. 16.  Continue trazodone 50 mg p.o. nightly as needed insomnia. 17.  Continue Artane 2 mg p.o. twice daily for side effects of medication. 18.  Continue levothyroxine 50 mcg p.o. daily for hypothyroidism. 19.  Await results of RPR. 20.  Physical therapy consultation pending. 21. continue dialysis. 22.  Disposition planning-in progress.     Sharma Covert, MD 10/10/2019, 11:22 AM

## 2019-10-10 NOTE — BHH Group Notes (Signed)
Feelings Around Relapse 10/10/2019 1PM  Type of Therapy and Topic:  Group Therapy:  Feelings around Relapse and Recovery  Participation Level:  Did Not Attend   Description of Group:    Patients in this group will discuss emotions they experience before and after a relapse. They will process how experiencing these feelings, or avoidance of experiencing them, relates to having a relapse. Facilitator will guide patients to explore emotions they have related to recovery. Patients will be encouraged to process which emotions are more powerful. They will be guided to discuss the emotional reaction significant others in their lives may have to patients' relapse or recovery. Patients will be assisted in exploring ways to respond to the emotions of others without this contributing to a relapse.  Therapeutic Goals: 1. Patient will identify two or more emotions that lead to a relapse for them 2. Patient will identify two emotions that result when they relapse 3. Patient will identify two emotions related to recovery 4. Patient will demonstrate ability to communicate their needs through discussion and/or role plays   Summary of Patient Progress:     Therapeutic Modalities:   Cognitive Behavioral Therapy Solution-Focused Therapy Assertiveness Training Relapse Prevention Therapy   Yvette Rack, [place] 10/10/2019 2:33 PM

## 2019-10-10 NOTE — Progress Notes (Signed)
Recreation Therapy Notes   Date: 10/10/2019  Time: 9:30 am   Location: Craft room    Behavioral response: N/A   Intervention Topic: Happiness   Discussion/Intervention: Patient did not attend group.   Clinical Observations/Feedback:  Patient did not attend group.   [name] LRT/CTRS        Todd Brown 10/10/2019 11:35 AM

## 2019-10-10 NOTE — Plan of Care (Signed)
Patient pleasant, calm and appropriate. Patient denies SI/HI/AVH   Problem: Education: Goal: Emotional status will improve Outcome: Progressing Goal: Mental status will improve Outcome: Progressing

## 2019-10-10 NOTE — Evaluation (Signed)
Physical Therapy Evaluation Patient Details Name: Todd Brown MRN: [id number] DOB: 1960-11-04 Today's Date: 10/10/2019   History of Present Illness  59 y/o male with shizophrenia referred to hospital secondary to missing multiple dialysis session and generally seeming less able to care for himself and with increased irritibility.  Clinical Impression  Pt sleeping in room on arrival, but quickly able to get to sitting EOB and engage with PT exam.  He was difficult to understand and showed some confusion but overall was very pleasant and willing to participate with PT.  He states that he has an O2 compressor at home and uses it regularly, O2 sats (on room air) were in the mid 90s at rest and dropped only to the high 80s during ambulation.  Pt did have some regular unsteadiness/stagger stepping during ambulation but never need assist to maintain balance, etc w/o AD.  He states that he does use a walking stick at times, per today's performance he would benefit from regular use of cane (not able to on behavior unit) but PT did mention to nursing that a walker could be beneficial if he seems to be having worsening issues with staggering/balance. Overall pt showed good mobility, strength, minor balance issues that are not overt safety concerns.  Will maintain on PT caseload mostly to focus on balance/safety awareness, appropriate for d/c to ALF from PT perspective, again appropriate to use [facility] when venue allows.     Follow Up Recommendations Home health PT(per actual baseline, pt may just need AD for unsteadiness)    Equipment Recommendations  None recommended by PT(apparently he has a SPC, unsure about walker?)    Recommendations for Other Services       Precautions / Restrictions Precautions Precautions: Fall Restrictions Weight Bearing Restrictions: No      Mobility  Bed Mobility Overal bed mobility: Independent             General bed mobility comments: Pt able to easily roll  over and get himself to sitting EOB  Transfers Overall transfer level: Independent Equipment used: None             General transfer comment: Even from very low bed pt was able to rise to standing with relative ease.  Heavy and expected use of UEs to rise, but no overt unsteadiness or need for extra time required  Ambulation/Gait Ambulation/Gait assistance: Supervision Gait Distance (Feet): 250 Feet Assistive device: None       General Gait Details: Pt is able to ambulate around unit and hallway confidently.  He did not have any overt LOBs or need for direct assist to maintain balance but did have consistent small stagger steps that were self arrested and he reports is near his baseline.  O2 after "prolonged" ambulation was in the high 80s on room air, quickly back into the mid 90s at rest.  Stairs            Wheelchair Mobility    Modified Rankin (Stroke Patients Only)       Balance Overall balance assessment: Modified Independent;Mild deficits observed, not formally tested                                           Pertinent Vitals/Pain Pain Assessment: (minimal, unrated R knee pain with WBing)    Home Living Family/patient expects to be discharged to:: Assisted living Living Arrangements: Alone  Home Equipment: Cane - single point(O2 compressor per pt report uses daily but not 24/7)      Prior Function           Comments: Pt does endorse 1 fall in the last 6 months, minor R knee injury, unsure how much outside assist he was getting but reports he'd go grocery shopping Qweek      Hand Dominance        Extremity/Trunk Assessment   Upper Extremity Assessment Upper Extremity Assessment: Generalized weakness;Overall [facility] for tasks assessed    Lower Extremity Assessment Lower Extremity Assessment: Overall WFL for tasks assessed;Generalized weakness       Communication   Communication: (garbled, often  incomprehensible speech)  Cognition Arousal/Alertness: Awake/alert Behavior During Therapy: WFL for tasks assessed/performed Overall Cognitive Status: Difficult to assess                                 General Comments: Pt with some general confusion, did not realize his birthday was next week until cued, then insists that he will be turning 57 (per chart it's 47)      General Comments General comments (skin integrity, edema, etc.): Pt reports he uses O2 at home as well as a "walking stick"    Exercises     Assessment/Plan    PT Assessment Patient needs continued PT services  PT Problem List Decreased activity tolerance;Decreased balance;Decreased mobility;Decreased coordination;Decreased cognition;Decreased knowledge of use of DME;Decreased safety awareness;Cardiopulmonary status limiting activity       PT Treatment Interventions Gait training;Therapeutic activities;Therapeutic exercise;Functional mobility training;DME instruction;Balance training;Neuromuscular re-education;Patient/family education;Cognitive remediation    PT Goals (Current goals can be found in the Care Plan section)  Acute Rehab PT Goals Patient Stated Goal: get to his new home set up PT Goal Formulation: With patient Time For Goal Achievement: 10/24/19 Potential to Achieve Goals: Good    Frequency Min 2X/week   Barriers to discharge        Co-evaluation               AM-PAC PT "6 Clicks" Mobility  Outcome Measure Help needed turning from your back to your side while in a flat bed without using bedrails?: None Help needed moving from lying on your back to sitting on the side of a flat bed without using bedrails?: None Help needed moving to and from a bed to a chair (including a wheelchair)?: None Help needed standing up from a chair using your arms (e.g., wheelchair or bedside chair)?: None Help needed to walk in hospital room?: A Little Help needed climbing 3-5 steps with a  railing? : A Little 6 Click Score: 22    End of Session Equipment Utilized During Treatment: Gait belt Activity Tolerance: Patient tolerated treatment well Patient left: (talking with nurse about getting inhaler) Nurse Communication: Mobility status PT Visit Diagnosis: Difficulty in walking, not elsewhere classified (R26.2);Unsteadiness on feet (R26.81);Muscle weakness (generalized) (M62.81)    Time: 1700-1749 PT Time Calculation (min) (ACUTE ONLY): 23 min   Charges:   PT Evaluation $PT Eval Low Complexity: 1 Low          Kreg Shropshire, DPT 10/10/2019, 4:12 PM

## 2019-10-10 NOTE — Progress Notes (Signed)
Pt is alert and oriented to person, but not to time, place or situation. Pt is pleasantly confused, visible in the dayroom, is noted to be responding to internal stimuli, frequent self-talk noted, mumbling, incomprehensibly. Pt also noted to be laughing inappropriately. Pt's appetite is good, pt was cooperative with physical therapy's assessment, no distress noted, none reported. Will continue to monitor pt per Q15 minute face checks and monitor for safety and progress.

## 2019-10-10 NOTE — Tx Team (Cosign Needed)
Interdisciplinary Treatment and Diagnostic Plan Update  10/10/2019 Time of Session: [place] MRN: [id number]  Principal Diagnosis: Schizophrenia [facility])  Secondary Diagnoses: Principal Problem:   Schizophrenia ([place]) Active Problems:   Essential hypertension, benign   Chronic kidney disease   Hepatitis C   End stage renal disease ([place])   Dialysis patient ([place])   Current Medications:  Current Facility-Administered Medications  Medication Dose Route Frequency Provider Last Rate Last Admin  . acetaminophen (TYLENOL) tablet 500 mg  500 mg Oral Q4H PRN Caroline Sauger, NP   500 mg at 10/10/19 0836  . albuterol (PROVENTIL) (2.5 MG/3ML) 0.083% nebulizer solution 2.5 mg  2.5 mg Inhalation Q4H PRN Caroline Sauger, NP   2.5 mg at 10/06/19 2154  . amLODipine (NORVASC) tablet 10 mg  10 mg Oral Daily Clapacs, Madie Reno, MD   10 mg at 10/10/19 0823  . calcium acetate (PHOSLO) capsule 1,334 mg  1,334 mg Oral TID WC Caroline Sauger, NP   1,334 mg at 10/10/19 0820  . carbamazepine (TEGRETOL XR) 12 hr tablet 1,000 mg  1,000 mg Oral Chester Holstein, Geni Bers, NP   1,000 mg at 10/10/19 0617  . Chlorhexidine Gluconate Cloth 2 % PADS 6 each  6 each Topical Q0600 Murlean Iba, MD      . cholecalciferol (VITAMIN D3) tablet 1,000 Units  1,000 Units Oral Daily Caroline Sauger, NP   1,000 Units at 10/10/19 0823  . cloNIDine (CATAPRES) tablet 0.3 mg  0.3 mg Oral TID Larita Fife, MD   0.3 mg at 10/10/19 0617  . docusate sodium (COLACE) capsule 100 mg  100 mg Oral BID Clapacs, Madie Reno, MD   100 mg at 10/10/19 0823  . ferrous sulfate tablet 325 mg  325 mg Oral Daily Caroline Sauger, NP   325 mg at 10/10/19 9242  . fluPHENAZine (PROLIXIN) tablet 2.5 mg  2.5 mg Oral BID Caroline Sauger, NP   2.5 mg at 10/10/19 6834  . heparin injection 1,400 Units  20 Units/kg Dialysis PRN Murlean Iba, MD      . hydrALAZINE (APRESOLINE) tablet 50 mg  50 mg Oral BID Sharma Covert, MD       . levothyroxine (SYNTHROID) tablet 50 mcg  50 mcg Oral Q0600 Sharma Covert, MD   50 mcg at 10/10/19 [phone]  . mometasone-formoterol (DULERA) 200-5 MCG/ACT inhaler 2 puff  2 puff Inhalation BID Caroline Sauger, NP   2 puff at 10/10/19 [phone]  . multivitamin (RENA-VIT) tablet 1 tablet  1 tablet Oral Daily Caroline Sauger, NP   1 tablet at 10/10/19 0824  . nicotine (NICODERM CQ - dosed in mg/24 hours) patch 21 mg  21 mg Transdermal Daily Clapacs, John T, MD   21 mg at 10/10/19 0830  . pantoprazole (PROTONIX) EC tablet 40 mg  40 mg Oral Daily Caroline Sauger, NP   40 mg at 10/10/19 0824  . senna (SENOKOT) tablet 17.2 mg  2 tablet Oral Daily PRN Clapacs, John T, MD      . temazepam (RESTORIL) capsule 15 mg  15 mg Oral QHS Clapacs, Madie Reno, MD   15 mg at 10/09/19 2115  . traZODone (DESYREL) tablet 50 mg  50 mg Oral Once PRN Larita Fife, MD      . trihexyphenidyl (ARTANE) tablet 2 mg  2 mg Oral BID Caroline Sauger, NP   2 mg at 10/10/19 9892   PTA Medications: Medications Prior to Admission  Medication Sig Dispense Refill Last Dose  . acetaminophen (TYLENOL) 500  MG tablet Take 500 mg by mouth every 4 (four) hours as needed for mild pain or moderate pain.      Marland Kitchen albuterol (PROAIR HFA) 108 (90 Base) MCG/ACT inhaler Inhale 1-2 puffs into the lungs every 4 (four) hours as needed for wheezing or shortness of breath.      Marland Kitchen amLODipine (NORVASC) 10 MG tablet Take 1 tablet (10 mg total) by mouth daily.     . calcium acetate (PHOSLO) 667 MG capsule Take 2 capsules (1,334 mg total) by mouth 3 (three) times daily with meals. 60 capsule 1   . carbamazepine (TEGRETOL XR) 200 MG 12 hr tablet Take 1,000 mg by mouth every morning.     . celecoxib (CELEBREX) 200 MG capsule Take 200 mg by mouth daily as needed for mild pain.     . cholecalciferol (VITAMIN D) 1000 UNITS tablet Take 1,000 Units by mouth daily.     . cloNIDine (CATAPRES) 0.3 MG tablet Take 0.3 mg by mouth 3 (three) times daily.      . ferrous sulfate 325 (65 FE) MG tablet Take 1 tablet (325 mg total) by mouth daily. 30 tablet 3   . fluPHENAZine (PROLIXIN) 2.5 MG tablet Take 2.5 mg by mouth 2 (two) times daily.      . Fluticasone-Salmeterol (ADVAIR) 500-50 MCG/DOSE AEPB Inhale 1 puff into the lungs 2 (two) times daily.     . hydrALAZINE (APRESOLINE) 100 MG tablet Take 1 tablet (100 mg total) by mouth 2 (two) times daily.     . minoxidil (LONITEN) 10 MG tablet Take 10 mg by mouth 2 (two) times daily.     . multivitamin (RENA-VIT) TABS tablet Take 1 tablet by mouth daily.     Marland Kitchen omeprazole (PRILOSEC) 40 MG capsule Take 1 capsule (40 mg total) by mouth daily. 30 capsule 1   . oxyCODONE (ROXICODONE) 5 MG immediate release tablet Take 1 tablet (5 mg total) by mouth every 8 (eight) hours as needed. 10 tablet 0   . trihexyphenidyl (ARTANE) 2 MG tablet Take 2 mg by mouth 2 (two) times daily.       Patient Stressors: Health problems Medication change or noncompliance  Patient Strengths: Capable of independent living General fund of knowledge  Treatment Modalities: Medication Management, Group therapy, Case management,  1 to 1 session with clinician, Psychoeducation, Recreational therapy.   Physician Treatment Plan for Primary Diagnosis: Schizophrenia ([place]) Long Term Goal(s): Improvement in symptoms so as ready for discharge Improvement in symptoms so as ready for discharge   Short Term Goals: Ability to verbalize feelings will improve Ability to demonstrate self-control will improve Ability to identify and develop effective coping behaviors will improve Ability to maintain clinical measurements within normal limits will improve Compliance with prescribed medications will improve  Medication Management: Evaluate patient's response, side effects, and tolerance of medication regimen.  Therapeutic Interventions: 1 to 1 sessions, Unit Group sessions and Medication administration.  Evaluation of Outcomes:  Progressing  Physician Treatment Plan for Secondary Diagnosis: Principal Problem:   Schizophrenia ([place]) Active Problems:   Essential hypertension, benign   Chronic kidney disease   Hepatitis C   End stage renal disease ([place])   Dialysis patient ([place])  Long Term Goal(s): Improvement in symptoms so as ready for discharge Improvement in symptoms so as ready for discharge   Short Term Goals: Ability to verbalize feelings will improve Ability to demonstrate self-control will improve Ability to identify and develop effective coping behaviors will improve Ability to maintain clinical measurements  within normal limits will improve Compliance with prescribed medications will improve     Medication Management: Evaluate patient's response, side effects, and tolerance of medication regimen.  Therapeutic Interventions: 1 to 1 sessions, Unit Group sessions and Medication administration.  Evaluation of Outcomes: Progressing   RN Treatment Plan for Primary Diagnosis: Schizophrenia ([place]) Long Term Goal(s): Knowledge of disease and therapeutic regimen to maintain health will improve  Short Term Goals: Ability to identify and develop effective coping behaviors will improve and Compliance with prescribed medications will improve  Medication Management: RN will administer medications as ordered by provider, will assess and evaluate patient's response and provide education to patient for prescribed medication. RN will report any adverse and/or side effects to prescribing provider.  Therapeutic Interventions: 1 on 1 counseling sessions, Psychoeducation, Medication administration, Evaluate responses to treatment, Monitor vital signs and CBGs as ordered, Perform/monitor CIWA, COWS, AIMS and Fall Risk screenings as ordered, Perform wound care treatments as ordered.  Evaluation of Outcomes: Progressing   LCSW Treatment Plan for Primary Diagnosis: Schizophrenia ([place]) Long Term Goal(s): Safe transition to  appropriate next level of care at discharge, Engage patient in therapeutic group addressing interpersonal concerns.  Short Term Goals: Engage patient in aftercare planning with referrals and resources  Therapeutic Interventions: Assess for all discharge needs, 1 to 1 time with Social worker, Explore available resources and support systems, Assess for adequacy in community support network, Educate family and significant other(s) on suicide prevention, Complete Psychosocial Assessment, Interpersonal group therapy.  Evaluation of Outcomes: Progressing   Progress in Treatment: Attending groups: No. Participating in groups: No. Taking medication as prescribed: Yes. Toleration medication: Yes. Family/Significant other contact made: Yes, individual(s) contacted:  pt declined Patient understands diagnosis: No. Discussing patient identified problems/goals with staff: Yes. Medical problems stabilized or resolved: No. Denies suicidal/homicidal ideation: Yes Issues/concerns per patient self-inventory: No. Other: NA  New problem(s) identified: No, Describe:  None reported  New Short Term/Long Term Goal(s):Attend outpatient treatment, develop and implement healthy coping methods, take medication as prescribed.  Patient Goals:  "I dont have a goal"  Discharge Plan or Barriers: Pt speaks gibberish, difficult to understand what he is saying. However, pt has an ACT [facility] at [facility]. DC plan TBD. Update 10/10/19- Pt's ACT Team reports they will contact DSS to pursue guardianship. Team reports concerns about pt not being able to properly care for himself and live alone. Team states they are working to secure placement at [facility], assisted living facility.  Reason for Continuation of Hospitalization: Medical Issues Medication stabilization  Estimated Length of Stay: TBD   Recreational Therapy: Patient Stressors: N/A Patient Goal: Patient will engage in groups without prompting or encouragement  from LRT x3 group sessions within 5 recreation therapy group sessions  Attendees: Patient: 10/10/2019 11:34 AM  Physician: Myles Lipps 10/10/2019 11:34 AM  Nursing: Cherie Dark, nurse 10/10/2019 11:34 AM  RN Care Manager: 10/10/2019 11:34 AM  Social Worker: Anise Salvo 10/10/2019 11:34 AM  Recreational Therapist:  10/10/2019 11:34 AM  Other:  10/10/2019 11:34 AM  Other:  10/10/2019 11:34 AM  Other: 10/10/2019 11:34 AM    Scribe for Treatment Team: Yvette Rack, LCSW 10/10/2019 11:34 AM

## 2019-10-10 NOTE — Progress Notes (Signed)
Patient was in the day room upon arrival to the unit. Patient presents without psychotic symptoms and observed interacting appropriately with staff and peers. Pt compliant with medication administration per MD orders. Pt has hx of HTN and BP remains elevated, see MAR. This Probation officer will continue to monitor. Patient given education, support and encouragement to be active in his treatment plan. Patient being monitored Q 15 minutes for safety per unit protocol. Patient remains safe on the unit.

## 2019-10-11 MED ORDER — CARVEDILOL 6.25 MG PO TABS
6.2500 mg | ORAL_TABLET | Freq: Two times a day (BID) | ORAL | Status: DC
  Administered 2019-10-12 – 2019-10-13 (×3): 6.25 mg via ORAL
  Filled 2019-10-11 (×3): qty 1

## 2019-10-11 MED ORDER — HYDRALAZINE HCL 50 MG PO TABS
50.0000 mg | ORAL_TABLET | Freq: Three times a day (TID) | ORAL | Status: DC
  Administered 2019-10-11 – 2019-10-12 (×3): 50 mg via ORAL
  Filled 2019-10-11 (×5): qty 1

## 2019-10-11 MED ORDER — FLUPHENAZINE HCL 5 MG PO TABS
5.0000 mg | ORAL_TABLET | Freq: Two times a day (BID) | ORAL | Status: DC
  Administered 2019-10-11 – 2019-10-22 (×23): 5 mg via ORAL
  Filled 2019-10-11 (×25): qty 1

## 2019-10-11 MED ORDER — TRIHEXYPHENIDYL HCL 2 MG PO TABS
2.0000 mg | ORAL_TABLET | Freq: Two times a day (BID) | ORAL | Status: DC | PRN
  Filled 2019-10-11: qty 1

## 2019-10-11 MED ORDER — LEVOTHYROXINE SODIUM 75 MCG PO TABS
75.0000 ug | ORAL_TABLET | Freq: Every day | ORAL | Status: DC
  Administered 2019-10-12 – 2019-10-13 (×2): 75 ug via ORAL
  Filled 2019-10-11 (×2): qty 1

## 2019-10-11 MED ORDER — TRIHEXYPHENIDYL HCL 2 MG PO TABS
2.0000 mg | ORAL_TABLET | Freq: Every day | ORAL | Status: DC | PRN
  Administered 2019-10-11 – 2019-10-16 (×8): 2 mg via ORAL
  Filled 2019-10-11 (×9): qty 1

## 2019-10-11 MED ORDER — DIPHENHYDRAMINE HCL 25 MG PO CAPS
50.0000 mg | ORAL_CAPSULE | Freq: Once | ORAL | Status: AC
  Administered 2019-10-11: 50 mg via ORAL

## 2019-10-11 NOTE — [facility] Counselor (Signed)
CSW sent referral to [facility] and spoke with Scot Jun [phone]. CSW attempted to reach Candyce Churn at [facility] [phone], mailbox full unable to leave vm.

## 2019-10-11 NOTE — Progress Notes (Signed)
[facility] MD Progress Note  10/11/2019 11:41 AM Todd Brown  MRN:  [id number] Subjective:  Patient is a 59 year old male with a past psychiatric history significant for schizophrenia as well as a past medical history significant for chronic renal disease and several other issues who was referred to the emergency department by his dialysis team out of concern for poor self-care. The patient had apparently missed 2 weeks of dialysis sessions and appeared to be agitated and aggressive. He is followed by ACTT service and they have been increasingly concerned about his physical issues. He was admitted to the psychiatric hospital for evaluation and stabilization.  Objective: Patient is seen and examined.  Patient is a 59 year old male with the above-stated past psychiatric history who is seen in follow-up.  Patient is basically unchanged.  We are still waiting for his ACTT service to find a safe place for him to live.  Clearly he is unable to care for himself living independently.  He did undergo dialysis today.  His blood pressure remains elevated, but slightly lower after the increase in the hydralazine.  He slept 8 hours last night.  His Tegretol level from 5/10 was 4.0.  TSH as previously discussed was 24.420.  His RPR was nonreactive.  Physical therapy consultation was completed yesterday.  Apparently a walker was not recommended at this time.  Principal Problem: Schizophrenia ([place]) Diagnosis: Principal Problem:   Schizophrenia ([place]) Active Problems:   Essential hypertension, benign   Chronic kidney disease   Hepatitis C   End stage renal disease ([place])   Dialysis patient ([place])  Total Time spent with patient: 15 minutes  Past Psychiatric History: See admission H&P  Past Medical History:  Past Medical History:  Diagnosis Date  . Anemia   . Diabetes mellitus without complication ([place])   . Dialysis patient [facility])    Mon. -Wed.- Fri  . DVT (deep venous thrombosis) ([place])    cephalic and  basolic vein thrombosis  . ESRD (end stage renal disease) ([place])   . Hyperlipidemia   . Malignant hypertension   . Renal artery stenosis ([place])   . Schizophrenia [facility])     Past Surgical History:  Procedure Laterality Date  . A/V FISTULAGRAM N/A 08/06/2016   Procedure: A/V Fistulagram;  Surgeon: Algernon Huxley, MD;  Location: [place] [facility];  Service: Cardiovascular;  Laterality: N/A;  . A/V SHUNT INTERVENTION N/A 08/06/2016   Procedure: A/V Shunt Intervention;  Surgeon: Algernon Huxley, MD;  Location: [facility];  Service: Cardiovascular;  Laterality: N/A;  . AV FISTULA PLACEMENT Left 12/26/2014   Procedure: ARTERIOVENOUS (AV) FISTULA CREATION;  Surgeon: Algernon Huxley, MD;  Location: [facility] ORS;  Service: Vascular;  Laterality: Left;  . ESOPHAGOGASTRODUODENOSCOPY (EGD) WITH PROPOFOL N/A 05/06/2017   Procedure: ESOPHAGOGASTRODUODENOSCOPY (EGD) WITH PROPOFOL;  Surgeon: Jonathon Bellows, MD;  Location: [facility] ENDOSCOPY;  Service: Gastroenterology;  Laterality: N/A;  . INSERTION OF DIALYSIS CATHETER Right   . PERIPHERAL VASCULAR CATHETERIZATION N/A 10/22/2014   Procedure: Dialysis/Perma Catheter Insertion;  Surgeon: Algernon Huxley, MD;  Location: [place] [facility];  Service: Cardiovascular;  Laterality: N/A;  . PERIPHERAL VASCULAR CATHETERIZATION N/A 02/28/2015   Procedure: Dialysis/Perma Catheter Removal;  Surgeon: Algernon Huxley, MD;  Location: [place] [facility];  Service: Cardiovascular;  Laterality: N/A;  . Repair fx left lower leg     yrs ago (age 88)   Family History:  Family History  Problem Relation Age of Onset  . Diabetes Neg Hx  Family Psychiatric  History: See admission H&P Social History:  Social History   Substance and Sexual Activity  Alcohol Use No     Social History   Substance and Sexual Activity  Drug Use Yes  . Frequency: 1.0 times per week  . Types: Marijuana    Social History   Socioeconomic History  . Marital status: Single    Spouse name: Not on file  .  Number of children: Not on file  . Years of education: Not on file  . Highest education level: Not on file  Occupational History  . Occupation: Disability   Tobacco Use  . Smoking status: Current Every Day Smoker    Packs/day: 0.50    Types: Cigarettes  . Smokeless tobacco: Never Used  . Tobacco comment: 10 cigars a day  Substance and Sexual Activity  . Alcohol use: No  . Drug use: Yes    Frequency: 1.0 times per week    Types: Marijuana  . Sexual activity: Not Currently  Other Topics Concern  . Not on file  Social History Narrative  . Not on file   Social Determinants of Health   Financial Resource Strain:   . Difficulty of Paying Living Expenses:   Food Insecurity:   . Worried About Charity fundraiser in the Last Year:   . Arboriculturist in the Last Year:   Transportation Needs:   . Film/video editor (Medical):   Marland Kitchen Lack of Transportation (Non-Medical):   Physical Activity:   . Days of Exercise per Week:   . Minutes of Exercise per Session:   Stress:   . Feeling of Stress :   Social Connections:   . Frequency of Communication with Friends and Family:   . Frequency of Social Gatherings with Friends and Family:   . Attends Religious Services:   . Active Member of Clubs or Organizations:   . Attends Archivist Meetings:   Marland Kitchen Marital Status:    Additional Social History:                         Sleep: Good  Appetite:  Good  Current Medications: Current Facility-Administered Medications  Medication Dose Route Frequency Provider Last Rate Last Admin  . acetaminophen (TYLENOL) tablet 500 mg  500 mg Oral Q4H PRN Caroline Sauger, NP   500 mg at 10/11/19 0630  . albuterol (PROVENTIL) (2.5 MG/3ML) 0.083% nebulizer solution 2.5 mg  2.5 mg Inhalation Q4H PRN Caroline Sauger, NP   2.5 mg at 10/10/19 2146  . amLODipine (NORVASC) tablet 10 mg  10 mg Oral Daily Clapacs, Madie Reno, MD   10 mg at 10/11/19 0858  . calcium acetate (PHOSLO)  capsule 1,334 mg  1,334 mg Oral TID WC Caroline Sauger, NP   1,334 mg at 10/11/19 0856  . carbamazepine (TEGRETOL XR) 12 hr tablet 1,000 mg  1,000 mg Oral [place] Holstein, Geni Bers, NP   1,000 mg at 10/11/19 0630  . Chlorhexidine Gluconate Cloth 2 % PADS 6 each  6 each Topical Q0600 Murlean Iba, MD      . cholecalciferol (VITAMIN D3) tablet 1,000 Units  1,000 Units Oral Daily Caroline Sauger, NP   1,000 Units at 10/11/19 0857  . cloNIDine (CATAPRES) tablet 0.3 mg  0.3 mg Oral TID Larita Fife, MD   0.3 mg at 10/11/19 0631  . docusate sodium (COLACE) capsule 100 mg  100 mg Oral BID Clapacs, Madie Reno, MD  100 mg at 10/11/19 0856  . ferrous sulfate tablet 325 mg  325 mg Oral Daily Caroline Sauger, NP   325 mg at 10/11/19 0856  . fluPHENAZine (PROLIXIN) tablet 2.5 mg  2.5 mg Oral BID Caroline Sauger, NP   2.5 mg at 10/11/19 0900  . heparin injection 1,400 Units  20 Units/kg Dialysis PRN Murlean Iba, MD      . hydrALAZINE (APRESOLINE) tablet 50 mg  50 mg Oral BID Sharma Covert, MD   50 mg at 10/11/19 0903  . levothyroxine (SYNTHROID) tablet 50 mcg  50 mcg Oral Q0600 Sharma Covert, MD   50 mcg at 10/11/19 0902  . mometasone-formoterol (DULERA) 200-5 MCG/ACT inhaler 2 puff  2 puff Inhalation BID Caroline Sauger, NP   2 puff at 10/11/19 0856  . multivitamin (RENA-VIT) tablet 1 tablet  1 tablet Oral Daily Caroline Sauger, NP   1 tablet at 10/11/19 0900  . nicotine (NICODERM CQ - dosed in mg/24 hours) patch 21 mg  21 mg Transdermal Daily Clapacs, Madie Reno, MD   21 mg at 10/11/19 0856  . pantoprazole (PROTONIX) EC tablet 40 mg  40 mg Oral Daily Caroline Sauger, NP   40 mg at 10/11/19 0856  . senna (SENOKOT) tablet 17.2 mg  2 tablet Oral Daily PRN Clapacs, John T, MD      . temazepam (RESTORIL) capsule 15 mg  15 mg Oral QHS Clapacs, Madie Reno, MD   15 mg at 10/10/19 2106  . traZODone (DESYREL) tablet 50 mg  50 mg Oral Once PRN Larita Fife, MD      .  trihexyphenidyl (ARTANE) tablet 2 mg  2 mg Oral BID Caroline Sauger, NP   2 mg at 10/11/19 0900    Lab Results:  Results for orders placed or performed during the hospital encounter of 10/04/19 (from the past 48 hour(s))  Phosphorus     Status: Abnormal   Collection Time: 10/09/19 12:56 PM  Result Value Ref Range   Phosphorus 1.8 (L) 2.5 - 4.6 mg/dL    Comment: Performed at [facility], [address] [place], [place] [address]  TSH     Status: Abnormal   Collection Time: 10/09/19 12:56 PM  Result Value Ref Range   TSH 24.428 (H) 0.350 - 4.500 uIU/mL    Comment: Performed by a 3rd Generation assay with a functional sensitivity of <=0.01 uIU/mL. Performed at [facility], [address] [place], [place] [address]   Carbamazepine level, total     Status: None   Collection Time: 10/09/19 12:56 PM  Result Value Ref Range   Carbamazepine Lvl 4.0 4.0 - 12.0 ug/mL    Comment: Performed at [facility], [address] [place],  [address]  RPR     Status: None   Collection Time: 10/09/19 12:56 PM  Result Value Ref Range   RPR Ser Ql NON REACTIVE NON REACTIVE    Comment: Performed at [facility] [address], [place],  [address]    Blood Alcohol level:  Lab Results  Component Value Date   [facility] <10 10/04/2019   ETH <5 29/56/2130    Metabolic Disorder Labs: Lab Results  Component Value Date   HGBA1C 5.0 09/12/2018   MPG 97 09/12/2018   No results found for: PROLACTIN Lab Results  Component Value Date   CHOL 186 10/06/2019   TRIG 75 10/06/2019   HDL 77 10/06/2019   CHOLHDL 2.4 10/06/2019   VLDL 15  10/06/2019   [place] 94 10/06/2019    Physical Findings: AIMS:  , ,  ,  ,    CIWA:    COWS:     Musculoskeletal: Strength & Muscle Tone: decreased Gait & Station: unsteady Patient leans: N/A  Psychiatric Specialty Exam: Physical Exam  Nursing note and vitals reviewed. Constitutional: He appears well-developed.   HENT:  Head: Normocephalic and atraumatic.  Respiratory: Effort normal.  Neurological: He is alert.    Review of Systems  Blood pressure (!) 196/95, pulse 67, temperature 98.1 F (36.7 C), temperature source Oral, resp. rate 15, height 6\' 3"  (1.905 m), weight 69.4 kg, SpO2 100 %.Body mass index is 19.12 kg/m.  General Appearance: Casual  Eye Contact:  Fair  Speech:  Garbled and Normal Rate  Volume:  Normal  Mood:  Euthymic  Affect:  Congruent  Thought Process:  Goal Directed and Descriptions of Associations: Circumstantial  Orientation:  Negative  Thought Content:  Negative  Suicidal Thoughts:  No  Homicidal Thoughts:  No  Memory:  Immediate;   Poor Recent;   Poor Remote;   Poor  Judgement:  Intact  Insight:  Fair  Psychomotor Activity:  Increased  Concentration:  Concentration: Fair and Attention Span: Negative  Recall:  Poor  Fund of Knowledge:  Poor  Language:  Fair  Akathisia:  Negative  Handed:  Right  AIMS (if indicated):     Assets:  Desire for Improvement Resilience  ADL's:  Intact  Cognition:  Impaired,  Moderate  Sleep:  Number of Hours: 8     Treatment Plan Summary: Daily contact with patient to assess and evaluate symptoms and progress in treatment, Medication management and Plan : Patient is seen and examined.  Patient is a 59 year old male with the above-stated past psychiatric history who is seen in follow-up.   Diagnosis: #1 schizophrenia, #2 chronic renal disease/chronic renal failure/end-stage renal disease, #3 malignant hypertension, #4 diabetes mellitus, #5 history of DVT, #6 renal artery stenosis, #7 hypothyroidism, #8 probable dementia  Patient is seen in follow-up.  He is essentially unchanged.  From a psychiatric perspective he is stable, and we are just awaiting for him to be put into a place where he will be safe and compliant with medications.  I will increase his hydralazine to 50 mg p.o. 3 times daily for his hypertension, and increase his  levothyroxine to 75 mcg p.o. daily. No other changes to his medications currently.    1. Continue albuterol as needed every 4 hours for COPD and shortness of breath. 2. Continue amlodipine 10 mg p.o. daily for hypertension. 3. Continue calcium acetate 1,334 mg secondary to dialysis and renal failure. 4. Continue Tegretol extended release 1000 mg p.o. daily for mood stability. 5. Continue vitamin D3 1000 units p.o. daily for vitamin D deficiency. 6. Continue clonidine 0.3 mg p.o. 3 times daily for hypertension. 7. Continue Colace 100 mg p.o. twice daily for constipation. 8. Continue iron sulfate 325 mg p.o. daily for anemia. 9. Continue Prolixin 2.5 mg p.o. twice daily for schizophrenia. 10. Continue heparin injection as directed. 11.  Increase hydralazine to 50 mg p.o. three times daily for hypertension. 12. Continue Dulera 2 puffs twice daily for COPD. 13. Continue multivitamin for renal failure 1 tablet p.o. daily. 14. Continue Protonix 40 mg p.o. daily for gastric protection. 15. Continue Senokot 17.2 g daily as needed constipation. 16. Continue temazepam 15 mg p.o. nightly for insomnia. 16. Continue trazodone 50 mg p.o. nightly as needed insomnia. 17. Continue Artane  2 mg p.o. twice daily for side effects of medication. 18.  Increase levothyroxine to 75 mcg p.o. daily for hypothyroidism. 19.  RPR negative. 20.  Physical therapy consultation completed.. 21. continue dialysis. 22.  Disposition planning-in progress.  Sharma Covert, MD 10/11/2019, 11:41 AM

## 2019-10-11 NOTE — Progress Notes (Signed)
[facility]  ROUNDING NOTE   Subjective:  Patient underwent hemodialysis treatment today. Tolerated well. Ultrafiltration achieved was 3 kg. During treatment patient was awake, alert, conversant.   Objective:  Vital signs in last 24 hours:  Temp:  [97.9 F (36.6 C)-98.1 F (36.7 C)] 97.9 F (36.6 C) (05/12 1307) Pulse Rate:  [64-78] 77 (05/12 1700) Resp:  [12-20] 18 (05/12 1700) BP: (164-201)/(81-135) 164/81 (05/12 1639) SpO2:  [97 %-100 %] 97 % (05/12 1700)  Weight change:  Filed Weights   10/04/19 2346 10/06/19 0915  Weight: 69.4 kg 69.4 kg    Intake/Output: I/O last 3 completed shifts: In: -  Out: 3000 [Other:3000]   Intake/Output this shift:  No intake/output data recorded.  Physical Exam: General: No acute distress  Head: Normocephalic, atraumatic. Moist oral mucosal membranes  Eyes: Anicteric  Neck: Supple, trachea midline  Lungs:  Clear to auscultation, normal effort  Heart: S1S2 no rubs  Abdomen:  Soft, nontender, bowel sounds present  Extremities: 1+ peripheral edema.  Neurologic: Awake, alert, following commands  Skin: No lesions  Access: LUE AVF    Basic Metabolic Panel: Recent Labs  Lab 10/06/19 0645 10/09/19 1256  NA 139  --   K 4.4  --   CL 99  --   CO2 27  --   GLUCOSE 76  --   BUN 65*  --   CREATININE 8.34*  --   CALCIUM 10.1  --   PHOS 5.2* 1.8*    Liver Function Tests: Recent Labs  Lab 10/06/19 0645  ALBUMIN 3.5   No results for input(s): LIPASE, AMYLASE in the last 168 hours. No results for input(s): AMMONIA in the last 168 hours.  CBC: No results for input(s): WBC, NEUTROABS, HGB, HCT, MCV, PLT in the last 168 hours.  Cardiac Enzymes: No results for input(s): CKTOTAL, CKMB, CKMBINDEX, TROPONINI in the last 168 hours.  BNP: Invalid input(s): POCBNP  CBG: No results for input(s): GLUCAP in the last 168 hours.  Microbiology: Results for orders placed or performed during the hospital encounter of  10/04/19  Respiratory Panel by RT PCR (Flu A&B, Covid) - Nasopharyngeal Swab     Status: None   Collection Time: 10/04/19  6:26 PM   Specimen: Nasopharyngeal Swab  Result Value Ref Range Status   SARS Coronavirus 2 by RT PCR NEGATIVE NEGATIVE Final    Comment: (NOTE) SARS-CoV-2 target nucleic acids are NOT DETECTED. The SARS-CoV-2 RNA is generally detectable in upper respiratoy specimens during the acute phase of infection. The lowest concentration of SARS-CoV-2 viral copies this assay can detect is 131 copies/mL. A negative result does not preclude SARS-Cov-2 infection and should not be used as the sole basis for treatment or other patient management decisions. A negative result may occur with  improper specimen collection/handling, submission of specimen other than nasopharyngeal swab, presence of viral mutation(s) within the areas targeted by this assay, and inadequate number of viral copies (<131 copies/mL). A negative result must be combined with clinical observations, patient history, and epidemiological information. The expected result is Negative. Fact Sheet for Patients:  PinkCheek.be Fact Sheet for Healthcare Providers:  GravelBags.it This test is not yet ap proved or cleared by the [place] FDA and  has been authorized for detection and/or diagnosis of SARS-CoV-2 by FDA under an Emergency Use Authorization (EUA). This EUA will remain  in effect (meaning this test can be used) for the duration of the COVID-19 declaration under Section 564(b)(1) of the Act, 21 U.S.C.  section 360bbb-3(b)(1), unless the authorization is terminated or revoked sooner.    Influenza A by PCR NEGATIVE NEGATIVE Final   Influenza B by PCR NEGATIVE NEGATIVE Final    Comment: (NOTE) The Xpert Xpress SARS-CoV-2/FLU/RSV assay is intended as an aid in  the diagnosis of influenza from Nasopharyngeal swab specimens and  should not be used as  a sole basis for treatment. Nasal washings and  aspirates are unacceptable for Xpert Xpress SARS-CoV-2/FLU/RSV  testing. Fact Sheet for Patients: PinkCheek.be Fact Sheet for Healthcare Providers: GravelBags.it This test is not yet approved or cleared by the [place] FDA and  has been authorized for detection and/or diagnosis of SARS-CoV-2 by  FDA under an Emergency Use Authorization (EUA). This EUA will remain  in effect (meaning this test can be used) for the duration of the  Covid-19 declaration under Section 564(b)(1) of the Act, 21  U.S.C. section 360bbb-3(b)(1), unless the authorization is  terminated or revoked. Performed at [facility], [address] [place], [place] [address]     Coagulation Studies: No results for input(s): LABPROT, INR in the last 72 hours.  Urinalysis: No results for input(s): COLORURINE, LABSPEC, PHURINE, GLUCOSEU, HGBUR, BILIRUBINUR, KETONESUR, PROTEINUR, UROBILINOGEN, NITRITE, LEUKOCYTESUR in the last 72 hours.  Invalid input(s): APPERANCEUR    Imaging: No results found.   Medications:    . amLODipine  10 mg Oral Daily  . calcium acetate  1,334 mg Oral TID WC  . carbamazepine  1,000 mg Oral BH-q7a  . Chlorhexidine Gluconate Cloth  6 each Topical Q0600  . cholecalciferol  1,000 Units Oral Daily  . cloNIDine  0.3 mg Oral TID  . docusate sodium  100 mg Oral BID  . ferrous sulfate  325 mg Oral Daily  . fluPHENAZine  5 mg Oral BID  . hydrALAZINE  50 mg Oral Q8H  . [START ON 10/12/2019] levothyroxine  75 mcg Oral Q0600  . mometasone-formoterol  2 puff Inhalation BID  . multivitamin  1 tablet Oral Daily  . nicotine  21 mg Transdermal Daily  . pantoprazole  40 mg Oral Daily  . temazepam  15 mg Oral QHS   acetaminophen, albuterol, heparin, senna, trihexyphenidyl  Assessment/ Plan:  59 y.o. male with past medical history of ESRD on HD MWF, hypertension, schizophrenia,  hepatitis C, history of DVT, anemia chronic kidney disease, secondary hyperparathyroidism, chronic volume overload who presented with worsening schizophrenia.  [place] Left AVF  1.  ESRD on HD MWF.  Patient completed dialysis treatment today.  Tolerated quite well.  Ultrafiltration achieved was 3 kg today.  Next Alysis treatment on Friday.  2.  Anemia chronic kidney disease.  Continue to periodically monitor hemoglobin.  3.  Secondary hyperparathyroidism.  Maintain the patient on calcium acetate 2 tablets p.o. 3 times daily with meals and periodically monitor bone mineral metabolism parameters.  4.  Schizophrenia.  Management as per psychiatry.   LOS: 7 Roseland Braun 5/12/20219:26 PM

## 2019-10-11 NOTE — BHH Group Notes (Signed)
LCSW Group Therapy Note  10/11/2019 2:14 PM  Type of Therapy/Topic:  Group Therapy:  Emotion Regulation  Participation Level:  Did Not Attend   Description of Group:   The purpose of this group is to assist patients in learning to regulate negative emotions and experience positive emotions. Patients will be guided to discuss ways in which they have been vulnerable to their negative emotions. These vulnerabilities will be juxtaposed with experiences of positive emotions or situations, and patients will be challenged to use positive emotions to combat negative ones. Special emphasis will be placed on coping with negative emotions in conflict situations, and patients will process healthy conflict resolution skills.  Therapeutic Goals: 1. Patient will identify two positive emotions or experiences to reflect on in order to balance out negative emotions 2. Patient will label two or more emotions that they find the most difficult to experience 3. Patient will demonstrate positive conflict resolution skills through discussion and/or role plays  Summary of Patient Progress: X  Therapeutic Modalities:   Cognitive Behavioral Therapy Feelings Identification Dialectical Behavioral Therapy  Assunta Curtis, MSW, LCSW 10/11/2019 2:14 PM

## 2019-10-11 NOTE — BHH Counselor (Signed)
CSW spoke with Todd Brown at [facility] who reports  they are waiting on [place] at [place] to finalize their decision. She states she spoke with the administrator(Dustin) this morning who was suppose to make final decision this morning but this has not occurred. She reports she will contact him at 2pm and follow up with CSW after she has spoken with him.  In addition, she says she has made an APS report for guardianship.

## 2019-10-11 NOTE — BHH Counselor (Signed)
CSW spoke with the Ginette Otto at [facility] in regards to a referral for Assisted Living.  She requested that the following be faxed to [phone]: -demographics -MAR -recent progress notes -H&P  CSW faxed the requested information and received confirmation for fas was successful.  Assunta Curtis, MSW, LCSW 10/11/2019 3:11 PM

## 2019-10-11 NOTE — Plan of Care (Signed)
Patient back from dialysis.As per report took 3 Litre off and as per the sitter patient was talking to himself at the dialysis.Patient verbalized that he need all his inhalers.Made MD aware of it.Refused nebulizer treatment at this time. Denies SI,HI and AVH.Compliant with medications.Appetite and energy level good.Ambulates with no assistance.Support and encouragement given.

## 2019-10-11 NOTE — BHH Counselor (Signed)
CSW spoke with Todd Brown at [facility] who reports the pt has been denied at the [facility] due to his physical and mental health challenges. She states she will continue to look at other ALF's for placement.

## 2019-10-11 NOTE — BHH Group Notes (Signed)
[facility] Group Notes:  (Nursing/MHT/Case Management/Adjunct)  Date:  10/11/2019  Time:  10:30 AM  Type of Therapy:  Psychoeducational Skills  Participation Level:  Did Not Attend  Mahala Rommel A Mariany Mackintosh 10/11/2019, 5:00 PM

## 2019-10-11 NOTE — Progress Notes (Signed)
Recreation Therapy Notes  Date: 10/11/2019  Time: 9:30 am   Location: Craft room    Behavioral response: N/A   Intervention Topic: Stress Management   Discussion/Intervention: Patient did not attend group.   Clinical Observations/Feedback:  Patient did not attend group.   Aldean Pipe LRT/CTRS         Lochlyn Zullo 10/11/2019 10:49 AM

## 2019-10-11 NOTE — BH Assessment (Signed)
[employer] Group Notes:  (Nursing/MHT/Case Management/Adjunct)  Date:  10/11/2019  Time:  10:27 PM  Type of Therapy:  Group Therapy  Participation Level:  Active  Participation Quality:  Appropriate  Affect:  Appropriate  Cognitive:  Alert  Insight:  Good  Engagement in Group:  Engaged and said he didn't have no goals.  Modes of Intervention:  Support  Summary of Progress/Problems:  Todd Brown 10/11/2019, 10:27 PM

## 2019-10-11 NOTE — Progress Notes (Signed)
   10/11/19 1307  Vital Signs  Temp 97.9 F (36.6 C)  Temp Source Oral  Pulse Rate 69  Pulse Rate Source Monitor  Resp 20  BP (!) 173/90  BP Location Right Arm  BP Method Automatic  Patient Position (if appropriate) Lying  Oxygen Therapy  SpO2 98 %  O2 Device Room Air  Pain Assessment  Pain Scale 0-10  Pain Score 0  During Hemodialysis Assessment  Blood Flow Rate (mL/min) 400 mL/min  Arterial Pressure (mmHg) -120 mmHg  Venous Pressure (mmHg) 210 mmHg  Transmembrane Pressure (mmHg) 90 mmHg  Ultrafiltration Rate (mL/min) 0 mL/min  Dialysate Flow Rate (mL/min) 800 ml/min  Conductivity: Machine  13.6  HD Safety Checks Performed Yes  KECN 81.5 KECN  Dialysis Fluid Bolus Normal Saline  Bolus Amount (mL) 250 mL  Intra-Hemodialysis Comments Progressing as prescribed;Tolerated well;Tx completed  Post-Hemodialysis Assessment  Rinseback Volume (mL) 250 mL  KECN 81.5 V  Dialyzer Clearance Lightly streaked  Duration of HD Treatment -hour(s) 3.5 hour(s)  Hemodialysis Intake (mL) 500 mL  UF Total -Machine (mL) 3500 mL  Net UF (mL) 3000 mL  Tolerated HD Treatment Yes  Post-Hemodialysis Comments tolerated well no s/s of distress to note  AVG/AVF Arterial Site Held (minutes) 10 minutes  AVG/AVF Venous Site Held (minutes) 10 minutes  Education / Care Plan  Dialysis Education Provided Yes  Documented Education in Care Plan Yes  Note  Observations tolerated well  Tolerated well treatment completed 3L removed

## 2019-10-12 MED ORDER — HYDRALAZINE HCL 50 MG PO TABS
100.0000 mg | ORAL_TABLET | Freq: Three times a day (TID) | ORAL | Status: DC
  Administered 2019-10-12 – 2019-10-22 (×30): 100 mg via ORAL
  Filled 2019-10-12 (×34): qty 2

## 2019-10-12 NOTE — Progress Notes (Signed)
Patient is alert and oriented x 2 with periods of confusion to time and situations at times, he currently denies SI/HI/AVH ,his thoughts are disorganized , his speech is slurred and unclear, he complained of pain and was medicated as ordered, he was noted interacting appropriately with peers and staff, 15 minutes safety checks maintained will continue to monitor. Marland Kitchen

## 2019-10-12 NOTE — Progress Notes (Signed)
Physical Therapy Treatment Patient Details Name: Todd Brown MRN: [id number] DOB: 17-May-1961 Today's Date: 10/12/2019    History of Present Illness 59 y/o male with shizophrenia referred to hospital secondary to missing multiple dialysis session and generally seeming less able to care for himself and with increased irritibility.    PT Comments    Pt in bed.  Stated he is feeling poorly today.  BP high this am which RN stated is his baseline.  Pt stated he wanted a breathing treatment prior to lunch.  He is able to get up from bed on his own despite some difficulty from low bed.  He is able to walk to treatment room with no AD and generally steady gait.  Reports some feelings of being "wobbly" but no interventions needed.  He sits quickly in treatment chair outside of room.  Appears generally fatigued and not feeling well.    Discussed with tech.  She reported that he does cough a lot while eating and pt confirms.  Will relay to RN for possible consideration of SLP eval to r/o swallwing difficulties or concerns.   Follow Up Recommendations  Home health PT     Equipment Recommendations  Cane    Recommendations for Other Services       Precautions / Restrictions Precautions Precautions: Fall Restrictions Weight Bearing Restrictions: No    Mobility  Bed Mobility Overal bed mobility: Independent                Transfers Overall transfer level: Independent Equipment used: None             General transfer comment: multiple attempts from low bed but able to do on his own.  Ambulation/Gait Ambulation/Gait assistance: Supervision;Min guard Gait Distance (Feet): 150 Feet Assistive device: None Gait Pattern/deviations: Step-through pattern;Staggering right;Staggering left;Wide base of support Gait velocity: decreased   General Gait Details: Generally unsteady but no inteventions needed.  Reports feeling wobbly.   Stairs             Wheelchair  Mobility    Modified Rankin (Stroke Patients Only)       Balance Overall balance assessment: Modified Independent;Mild deficits observed, not formally tested                                          Cognition Arousal/Alertness: Awake/alert Behavior During Therapy: WFL for tasks assessed/performed Overall Cognitive Status: Difficult to assess                                 General Comments: mumbles at times and difficult to understand      Exercises      General Comments        Pertinent Vitals/Pain Pain Assessment: No/denies pain    Home Living                      Prior Function            PT Goals (current goals can now be found in the care plan section) Progress towards PT goals: Progressing toward goals    Frequency    Min 2X/week      PT Plan Current plan remains appropriate    Co-evaluation              AM-PAC PT "6 Clicks"  Mobility   Outcome Measure  Help needed turning from your back to your side while in a flat bed without using bedrails?: None Help needed moving from lying on your back to sitting on the side of a flat bed without using bedrails?: None Help needed moving to and from a bed to a chair (including a wheelchair)?: None Help needed standing up from a chair using your arms (e.g., wheelchair or bedside chair)?: None Help needed to walk in hospital room?: A Little Help needed climbing 3-5 steps with a railing? : A Little 6 Click Score: 22    End of Session Equipment Utilized During Treatment: Gait belt Activity Tolerance: Patient limited by fatigue Patient left: in chair Nurse Communication: Mobility status;Other (comment)       Time: 1120-1130 PT Time Calculation (min) (ACUTE ONLY): 10 min  Charges:  $Gait Training: 8-22 mins                    Chesley Noon, PTA 10/12/19, 11:45 AM

## 2019-10-12 NOTE — Progress Notes (Signed)
Pt is alert and oriented to person, place, but not to date, or situation. Pt has a blunted affect, makes fair eye contact, is visible in the dayroom watching tv, does not initiate interaction with peers or staff, but will make his needs known to staff such as when he wants a pain medication. Pt c/o chronic generalized pain, requested and given PRN PO pain meds, see MAR for those details. Pt denies suicidal and homicidal ideation, denies hallucinations, denies feelings of depression and anxiety, but is often noted to be responding to internal stimuli, self-talk noted, noted to be laughing inappropriately while sitting alone. Pt's appetite is good, pt spends time watching tv in the dayroom. No distress noted, none reported. Will continue to monitor pt per Q15 minute face checks and monitor for safety and progress.

## 2019-10-12 NOTE — BHH Group Notes (Signed)
[facility] Group Notes:  (Nursing/MHT/Case Management/Adjunct)  Date:  10/12/2019  Time:  11:34 PM  Type of Therapy:  Wrap-UP  Participation Level:  Did Not Attend  Participation Quality:    Affect:    Cognitive:    Insight:    Engagement in Group:    Modes of Intervention:    Summary of Progress/Problems:  Todd Brown 10/12/2019, 11:34 PM

## 2019-10-12 NOTE — BHH Counselor (Signed)
CSW contacted the following in an effort to identify possible housing:  [facility], [phone] Voicemail box was full and unable to leave HIPAA compliant voicemail.  Madily[facility] Fireman [facility], [phone] Patient denied due to his behaviors.  Reports can not accept him with the behaviors identified at the placement she had available.   Ginette Otto, [place], [phone] Had several questions on the patient's disability amount, who signs for patient if concerns are that he is not able to make his own decisions, and where the patient receives his dialysis when not in the hospital and can he return.  CSW informed that she will call the patient's ACT team and hopefully obtain those answers.  Assunta Curtis, MSW, LCSW 10/12/2019 1:39 PM

## 2019-10-12 NOTE — Progress Notes (Signed)
Recreation Therapy Notes  Date: 10/12/2019  Time: 9:30 am   Location: Craft room    Behavioral response: N/A   Intervention Topic: Relaxation   Discussion/Intervention: Patient did not attend group.   Clinical Observations/Feedback:  Patient did not attend group.   Kenon Delashmit LRT/CTRS        Meiko Ives 10/12/2019 11:09 AM

## 2019-10-12 NOTE — BHH Group Notes (Signed)
Balance In Life 10/12/2019 1PM  Type of Therapy/Topic:  Group Therapy:  Balance in Life  Participation Level:  Did Not Attend  Description of Group:   This group will address the concept of balance and how it feels and looks when one is unbalanced. Patients will be encouraged to process areas in their lives that are out of balance and identify reasons for remaining unbalanced. Facilitators will guide patients in utilizing problem-solving interventions to address and correct the stressor making their life unbalanced. Understanding and applying boundaries will be explored and addressed for obtaining and maintaining a balanced life. Patients will be encouraged to explore ways to assertively make their unbalanced needs known to significant others in their lives, using other group members and facilitator for support and feedback.  Therapeutic Goals: 1. Patient will identify two or more emotions or situations they have that consume much of in their lives. 2. Patient will identify signs/triggers that life has become out of balance:  3. Patient will identify two ways to set boundaries in order to achieve balance in their lives:  4. Patient will demonstrate ability to communicate their needs through discussion and/or role plays  Summary of Patient Progress:    Therapeutic Modalities:   Cognitive Behavioral Therapy Solution-Focused Therapy Assertiveness Training  Kitt Minardi Lynelle Smoke, LCSW

## 2019-10-12 NOTE — Progress Notes (Signed)
Todd Brown  MRN: [id number]  DOB/AGE: [birth year] 59 y.o.  Primary Care Physician:System, Pcp Not In  Admit date: 10/04/2019  Chief Complaint:  Chief Complaint  Patient presents with  . Paranoid    S-Pt presented on  10/04/2019 with  Chief Complaint  Patient presents with  . Paranoid   Patient seen on the behavioral health ward Patient voices no new concerns  Medications   . amLODipine  10 mg Oral Daily  . calcium acetate  1,334 mg Oral TID WC  . carbamazepine  1,000 mg Oral BH-q7a  . carvedilol  6.25 mg Oral BID WC  . Chlorhexidine Gluconate Cloth  6 each Topical Q0600  . cholecalciferol  1,000 Units Oral Daily  . cloNIDine  0.3 mg Oral TID  . docusate sodium  100 mg Oral BID  . ferrous sulfate  325 mg Oral Daily  . fluPHENAZine  5 mg Oral BID  . hydrALAZINE  100 mg Oral Q8H  . levothyroxine  75 mcg Oral Q0600  . mometasone-formoterol  2 puff Inhalation BID  . multivitamin  1 tablet Oral Daily  . nicotine  21 mg Transdermal Daily  . pantoprazole  40 mg Oral Daily  . temazepam  15 mg Oral QHS         CVE:LFYBO from the symptoms mentioned above,there are no other symptoms referable to all systems reviewed.  Physical Exam: Vital signs in last 24 hours: Temp:  [98 F (36.7 C)] 98 F (36.7 C) (05/13 0614) Pulse Rate:  [66-79] 79 (05/13 1653) Resp:  [16-18] 16 (05/13 1653) BP: (178-226)/(102-114) 178/104 (05/13 1653) SpO2:  [76 %-99 %] 99 % (05/13 1653) Weight change:  Last BM Date: 10/09/19  Intake/Output from previous day: 05/12 0701 - 05/13 0700 In: -  Out: 3000  No intake/output data recorded.   Physical Exam: General- pt is awake,alert, oriented to time place and person Resp- No acute REsp distress, CTA B/L NO Rhonchi CVS- S1S2 regular in rate and rhythm GIT- BS+, soft, NT, ND EXT- NO LE Edema, Cyanosis Access left aVF AV fistula  Lab Results: CBC No results for input(s): WBC, HGB, HCT, PLT in the last 72 hours.  Patient last  hemoglobin was 10.0 on May 9  BMET No results for input(s): NA, K, CL, CO2, GLUCOSE, BUN, CREATININE, CALCIUM in the last 72 hours.  Patient last Chem-7 done on May 9 shows sodium 140 Potassium 6.1 Bicarb 29 Calcium 9.3 Phosphorus was 5.2 on May 7  MICRO Recent Results (from the past 240 hour(s))  Respiratory Panel by RT PCR (Flu A&B, Covid) - Nasopharyngeal Swab     Status: None   Collection Time: 10/04/19  6:26 PM   Specimen: Nasopharyngeal Swab  Result Value Ref Range Status   SARS Coronavirus 2 by RT PCR NEGATIVE NEGATIVE Final    Comment: (NOTE) SARS-CoV-2 target nucleic acids are NOT DETECTED. The SARS-CoV-2 RNA is generally detectable in upper respiratoy specimens during the acute phase of infection. The lowest concentration of SARS-CoV-2 viral copies this assay can detect is 131 copies/mL. A negative result does not preclude SARS-Cov-2 infection and should not be used as the sole basis for treatment or other patient management decisions. A negative result may occur with  improper specimen collection/handling, submission of specimen other than nasopharyngeal swab, presence of viral mutation(s) within the areas targeted by this assay, and inadequate number of viral copies (<131 copies/mL). A negative result must be combined with clinical observations, patient history, and epidemiological information. The  expected result is Negative. Fact Sheet for Patients:  PinkCheek.be Fact Sheet for Healthcare Providers:  GravelBags.it This test is not yet ap proved or cleared by the [place] FDA and  has been authorized for detection and/or diagnosis of SARS-CoV-2 by FDA under an Emergency Use Authorization (EUA). This EUA will remain  in effect (meaning this test can be used) for the duration of the COVID-19 declaration under Section 564(b)(1) of the Act, 21 U.S.C. section 360bbb-3(b)(1), unless the authorization is  terminated or revoked sooner.    Influenza A by PCR NEGATIVE NEGATIVE Final   Influenza B by PCR NEGATIVE NEGATIVE Final    Comment: (NOTE) The Xpert Xpress SARS-CoV-2/FLU/RSV assay is intended as an aid in  the diagnosis of influenza from Nasopharyngeal swab specimens and  should not be used as a sole basis for treatment. Nasal washings and  aspirates are unacceptable for Xpert Xpress SARS-CoV-2/FLU/RSV  testing. Fact Sheet for Patients: PinkCheek.be Fact Sheet for Healthcare Providers: GravelBags.it This test is not yet approved or cleared by the [place] FDA and  has been authorized for detection and/or diagnosis of SARS-CoV-2 by  FDA under an Emergency Use Authorization (EUA). This EUA will remain  in effect (meaning this test can be used) for the duration of the  Covid-19 declaration under Section 564(b)(1) of the Act, 21  U.S.C. section 360bbb-3(b)(1), unless the authorization is  terminated or revoked. Performed at [facility], [address], [place], [place] [address]       Lab Results  Component Value Date   PTH 194 (H) 05/11/2017   CALCIUM 10.1 10/06/2019   PHOS 1.8 (L) 10/09/2019               Impression:    Patient is a 59 year old African-American male with a past medical history of end-stage renal disease on hemodialysis-Monday Wednesday Friday schedule, hypertension, secondary hyperparathyroidism, schizophrenia, hepatitis C, history of DVT, history of anemia of chronic disease who was admitted with schizophrenia and noncompliance with dialysis treatment  1)Renal   ESRD on hemodialysis Patient is on Monday Wednesday Friday schedule. As an outpatient patient is under care for [facility] and get dialyzed at [facility] [address] on Monday Wednesday Friday schedule.Patient was last dialyzed yesterday No need for dialysis today  2)HTN We will continue to monitor patient blood  pressure   3)Anemia of chronic disease  HGb at goal (9--11)   4) secondary hyperparathyroidism -CKD Mineral-Bone Disorder   Secondary Hyperparathyroidism  present .   Last phosphorus level lower side we will recheck labs  5) schizophrenia Patient being followed closely by psychiatry  6) electrolytes   sodium Normonatremic   potassium Normokalemic    7)Acid base Co2 at goal     Plan:  We will dialyze patient in a.m. No need for dialysis today We will follow up on patient's hemoglobin and CKD bone mineral disease-secondary hyperparathyroidism labs      Laquandra Carrillo s [facility] 10/12/2019, 6:38 PM

## 2019-10-12 NOTE — BHH Counselor (Signed)
CSW spoke with [facility] with the patient's ACT team.  She reports that she does not know the patient's disability amount, she will check with the payee services and follow back up.   She reports that patient can return to [facility] upon discharge.  She reports that patient is his own guardian, therefore has the right to change his mind and could not sign for his own placement at a memory/ALF facility.    CSW called back and spoke with Delphina in an effort to follow up on the disability amount, she reports that the ACT team still does not know. She reports that they have also looked for placement a [facility]. She reports that ideally they would like to transition the patient from the hospital to an ALF, however, due to time constraints the patient may have to transition to one from home.   Assunta Curtis, MSW, LCSW 10/12/2019 3:24 PM

## 2019-10-12 NOTE — BHH Counselor (Signed)
CSW attempted to contact Ginette Otto at [facility], [phone].  CSW was unable to speak wihther and left HIPAA compliant voicemail.  Assunta Curtis, MSW, LCSW 10/12/2019 10:40 AM

## 2019-10-12 NOTE — Progress Notes (Signed)
[facility] MD Progress Note  10/12/2019 10:53 AM Todd Brown  MRN:  [id number] Subjective:  Patient is a 59 year old male with a past psychiatric history significant for schizophrenia as well as a past medical history significant for chronic renal disease and several other issues who was referred to the emergency department by his dialysis team out of concern for poor self-care. The patient had apparently missed 2 weeks of dialysis sessions and appeared to be agitated and aggressive. He is followed by ACTT service and they have been increasingly concerned about his physical issues. He was admitted to the psychiatric hospital for evaluation and stabilization.  Objective: Patient is seen and examined.  Patient is a 59 year old male with the above-stated past psychiatric history seen in follow-up.  He is basically unchanged this morning.  He is alert to it being 2021.  He still has his usual speech pattern.  He denied any auditory or visual hallucinations.  His blood pressure continues to be significantly elevated.  His hydralazine was increased to 3 times daily yesterday, and probably have to increase that today.  His next dialysis is scheduled for tomorrow.  His ACTT service was unable to get him into [place], so I have asked our social workers to assist them in trying to have him placed.  I do not believe that he would be able to care for himself as independently living person.  Again his blood pressure this morning is significantly elevated at 226/102.  They have his pulse oximetry at 76% on room air.  When I met with the patient he was not dyspneic or any other pulmonary symptoms that I could see.  He did refuse his albuterol nebulization treatment yesterday.  I ordered a chest x-ray, but he declined to go get that done.  I will talk to them today about trying to let us do that.  He slept 5.75 hours last night.  No new laboratories.  Principal Problem: Schizophrenia ([place]) Diagnosis: Principal  Problem:   Schizophrenia ([place]) Active Problems:   Essential hypertension, benign   Chronic kidney disease   Hepatitis C   End stage renal disease ([place])   Dialysis patient ([place])  Total Time spent with patient: 15 minutes  Past Psychiatric History: See admission H&P  Past Medical History:  Past Medical History:  Diagnosis Date  . Anemia   . Diabetes mellitus without complication ([place])   . Dialysis patient [facility])    Mon. -Wed.- Fri  . DVT (deep venous thrombosis) ([place])    cephalic and basolic vein thrombosis  . ESRD (end stage renal disease) ([place])   . Hyperlipidemia   . Malignant hypertension   . Renal artery stenosis ([place])   . Schizophrenia [facility])     Past Surgical History:  Procedure Laterality Date  . A/V FISTULAGRAM N/A 08/06/2016   Procedure: A/V Fistulagram;  Surgeon: Algernon Huxley, MD;  Location: [place] [facility];  Service: Cardiovascular;  Laterality: N/A;  . A/V SHUNT INTERVENTION N/A 08/06/2016   Procedure: A/V Shunt Intervention;  Surgeon: Algernon Huxley, MD;  Location: [place] [facility];  Service: Cardiovascular;  Laterality: N/A;  . AV FISTULA PLACEMENT Left 12/26/2014   Procedure: ARTERIOVENOUS (AV) FISTULA CREATION;  Surgeon: Algernon Huxley, MD;  Location: [facility] ORS;  Service: Vascular;  Laterality: Left;  . ESOPHAGOGASTRODUODENOSCOPY (EGD) WITH PROPOFOL N/A 05/06/2017   Procedure: ESOPHAGOGASTRODUODENOSCOPY (EGD) WITH PROPOFOL;  Surgeon: Jonathon Bellows, MD;  Location: [facility] ENDOSCOPY;  Service: Gastroenterology;  Laterality: N/A;  . INSERTION OF  DIALYSIS CATHETER Right   . PERIPHERAL VASCULAR CATHETERIZATION N/A 10/22/2014   Procedure: Dialysis/Perma Catheter Insertion;  Surgeon: Algernon Huxley, MD;  Location: [place] [facility];  Service: Cardiovascular;  Laterality: N/A;  . PERIPHERAL VASCULAR CATHETERIZATION N/A 02/28/2015   Procedure: Dialysis/Perma Catheter Removal;  Surgeon: Algernon Huxley, MD;  Location: [place] [facility];  Service: Cardiovascular;  Laterality: N/A;  .  Repair fx left lower leg     yrs ago (age 89)   Family History:  Family History  Problem Relation Age of Onset  . Diabetes Neg Hx    Family Psychiatric  History: See admission H&P Social History:  Social History   Substance and Sexual Activity  Alcohol Use No     Social History   Substance and Sexual Activity  Drug Use Yes  . Frequency: 1.0 times per week  . Types: Marijuana    Social History   Socioeconomic History  . Marital status: Single    Spouse name: Not on file  . Number of children: Not on file  . Years of education: Not on file  . Highest education level: Not on file  Occupational History  . Occupation: Disability   Tobacco Use  . Smoking status: Current Every Day Smoker    Packs/day: 0.50    Types: Cigarettes  . Smokeless tobacco: Never Used  . Tobacco comment: 10 cigars a day  Substance and Sexual Activity  . Alcohol use: No  . Drug use: Yes    Frequency: 1.0 times per week    Types: Marijuana  . Sexual activity: Not Currently  Other Topics Concern  . Not on file  Social History Narrative  . Not on file   Social Determinants of Health   Financial Resource Strain:   . Difficulty of Paying Living Expenses:   Food Insecurity:   . Worried About Charity fundraiser in the Last Year:   . Arboriculturist in the Last Year:   Transportation Needs:   . Film/video editor (Medical):   Marland Kitchen Lack of Transportation (Non-Medical):   Physical Activity:   . Days of Exercise per Week:   . Minutes of Exercise per Session:   Stress:   . Feeling of Stress :   Social Connections:   . Frequency of Communication with Friends and Family:   . Frequency of Social Gatherings with Friends and Family:   . Attends Religious Services:   . Active Member of Clubs or Organizations:   . Attends Archivist Meetings:   Marland Kitchen Marital Status:    Additional Social History:                         Sleep: Fair  Appetite:  Good  Current  Medications: Current Facility-Administered Medications  Medication Dose Route Frequency Provider Last Rate Last Admin  . acetaminophen (TYLENOL) tablet 500 mg  500 mg Oral Q4H PRN Caroline Sauger, NP   500 mg at 10/12/19 0800  . albuterol (PROVENTIL) (2.5 MG/3ML) 0.083% nebulizer solution 2.5 mg  2.5 mg Inhalation Q4H PRN Sharma Covert, MD   2.5 mg at 10/11/19 1704  . amLODipine (NORVASC) tablet 10 mg  10 mg Oral Daily Clapacs, Madie Reno, MD   10 mg at 10/12/19 0626  . calcium acetate (PHOSLO) capsule 1,334 mg  1,334 mg Oral TID WC Caroline Sauger, NP   1,334 mg at 10/12/19 0746  . carbamazepine (TEGRETOL XR) 12 hr tablet  1,000 mg  1,000 mg Oral [place] Holstein, Geni Bers, NP   1,000 mg at 10/12/19 0749  . carvedilol (COREG) tablet 6.25 mg  6.25 mg Oral BID WC Lateef, [place], MD   6.25 mg at 10/12/19 0747  . Chlorhexidine Gluconate Cloth 2 % PADS 6 each  6 each Topical Q0600 Murlean Iba, MD      . cholecalciferol (VITAMIN D3) tablet 1,000 Units  1,000 Units Oral Daily Caroline Sauger, NP   1,000 Units at 10/12/19 0746  . cloNIDine (CATAPRES) tablet 0.3 mg  0.3 mg Oral TID Larita Fife, MD   0.3 mg at 10/12/19 0747  . docusate sodium (COLACE) capsule 100 mg  100 mg Oral BID Clapacs, Madie Reno, MD   100 mg at 10/12/19 0746  . ferrous sulfate tablet 325 mg  325 mg Oral Daily Caroline Sauger, NP   325 mg at 10/12/19 0747  . fluPHENAZine (PROLIXIN) tablet 5 mg  5 mg Oral BID Sharma Covert, MD   5 mg at 10/12/19 0748  . heparin injection 1,400 Units  20 Units/kg Dialysis PRN Murlean Iba, MD      . hydrALAZINE (APRESOLINE) tablet 50 mg  50 mg Oral Q8H Sharma Covert, MD   50 mg at 10/12/19 1610  . levothyroxine (SYNTHROID) tablet 75 mcg  75 mcg Oral Q0600 Sharma Covert, MD   75 mcg at 10/12/19 [phone]  . mometasone-formoterol (DULERA) 200-5 MCG/ACT inhaler 2 puff  2 puff Inhalation BID Caroline Sauger, NP   2 puff at 10/12/19 0752  . multivitamin (RENA-VIT)  tablet 1 tablet  1 tablet Oral Daily Caroline Sauger, NP   1 tablet at 10/12/19 0748  . nicotine (NICODERM CQ - dosed in mg/24 hours) patch 21 mg  21 mg Transdermal Daily Clapacs, Madie Reno, MD   21 mg at 10/12/19 0749  . pantoprazole (PROTONIX) EC tablet 40 mg  40 mg Oral Daily Caroline Sauger, NP   40 mg at 10/12/19 0746  . senna (SENOKOT) tablet 17.2 mg  2 tablet Oral Daily PRN Clapacs, John T, MD      . temazepam (RESTORIL) capsule 15 mg  15 mg Oral QHS Clapacs, Madie Reno, MD   15 mg at 10/11/19 2116  . trihexyphenidyl (ARTANE) tablet 2 mg  2 mg Oral Daily PRN Sharma Covert, MD   2 mg at 10/11/19 1646    Lab Results: No results found for this or any previous visit (from the past 22 hour(s)).  Blood Alcohol level:  Lab Results  Component Value Date   ETH <10 10/04/2019   ETH <5 54/01/8118    Metabolic Disorder Labs: Lab Results  Component Value Date   HGBA1C 5.0 09/12/2018   MPG 97 09/12/2018   No results found for: PROLACTIN Lab Results  Component Value Date   CHOL 186 10/06/2019   TRIG 75 10/06/2019   HDL 77 10/06/2019   CHOLHDL 2.4 10/06/2019   VLDL 15 10/06/2019   LDLCALC 94 10/06/2019    Physical Findings: AIMS:  , ,  ,  ,    CIWA:    COWS:     Musculoskeletal: Strength & Muscle Tone: decreased Gait & Station: unsteady Patient leans: N/A  Psychiatric Specialty Exam: Physical Exam  Nursing note and vitals reviewed. Constitutional: He is oriented to person, place, and time. He appears well-developed and well-nourished.  HENT:  Head: Normocephalic and atraumatic.  Respiratory: Effort normal.  Neurological: He is alert and oriented to person, place, and time.  Review of Systems  Blood pressure (!) 226/102, pulse 72, temperature 98 F (36.7 C), temperature source Oral, resp. rate 18, height 6' 3" (1.905 m), weight 69.4 kg, SpO2 (!) 76 %.Body mass index is 19.12 kg/m.  General Appearance: Disheveled  Eye Contact:  Fair  Speech:  Normal Rate   Volume:  Increased  Mood:  Euthymic  Affect:  Congruent  Thought Process:  Goal Directed and Descriptions of Associations: Circumstantial  Orientation:  Negative  Thought Content:  Logical  Suicidal Thoughts:  No  Homicidal Thoughts:  No  Memory:  Immediate;   Poor Recent;   Poor Remote;   Poor  Judgement:  Intact  Insight:  Lacking  Psychomotor Activity:  Normal  Concentration:  Concentration: Fair and Attention Span: Fair  Recall:  Poor  Fund of Knowledge:  Poor  Language:  Fair  Akathisia:  Negative  Handed:  Right  AIMS (if indicated):     Assets:  Desire for Improvement Resilience  ADL's:  Intact  Cognition:  Impaired,  Moderate  Sleep:  Number of Hours: 5.75     Treatment Plan Summary: Daily contact with patient to assess and evaluate symptoms and progress in treatment, Medication management and Plan : Patient is seen and examined.  Patient is a 59 year old male with the above-stated past psychiatric history who is seen in follow-up.   Diagnosis: #1 schizophrenia, #2 chronic renal disease/chronic renal failure/end-stage renal disease, #3 malignant hypertension, #4 diabetes mellitus, #5 history of DVT, #6 renal artery stenosis, #7 hypothyroidism, #8 probable dementia  Patient is seen in follow-up.  He is essentially unchanged.  From a psychiatric standpoint he is stable, and we are just awaiting for him to find a place to live.  I do not believe that he is able to take care of himself by himself.  No change in his medications except I will increase his hydralazine for his hypertension.  No change in the rest of his medications.  1. Continue albuterol as needed every 4 hours for COPD and shortness of breath. 2. Continue amlodipine 10 mg p.o. daily for hypertension. 3. Continue calcium acetate 1,334 mg secondary to dialysis and renal failure. 4. Continue Tegretol extended release 1000 mg p.o. daily for mood stability. 5. Continue vitamin D3 1000 units p.o. daily for  vitamin D deficiency. 6. Continue clonidine 0.3 mg p.o. 3 times daily for hypertension. 7. Continue Colace 100 mg p.o. twice daily for constipation. 8. Continue iron sulfate 325 mg p.o. daily for anemia. 9. Continue Prolixin 2.5 mg p.o. twice daily for schizophrenia. 10. Continue heparin injection as directed. 11.Increasehydralazine to160m p.o. three times daily for hypertension. 12. Continue Dulera 2 puffs twice daily for COPD. 13. Continue multivitamin for renal failure 1 tablet p.o. daily. 14. Continue Protonix 40 mg p.o. daily for gastric protection. 15. Continue Senokot 17.2 g daily as needed constipation. 16. Continue temazepam 15 mg p.o. nightly for insomnia. 16. Continue trazodone 50 mg p.o. nightly as needed insomnia. 17. Continue Artane 2 mg p.o. twice daily for side effects of medication. 18. Increase levothyroxine to 75 mcg p.o. daily for hypothyroidism. 19.  RPR negative. 20. Physical therapy consultation completed.. 21.continue dialysis. 22.Disposition planning-in progress.  GSharma Covert MD 10/12/2019, 10:53 AM

## 2019-10-13 LAB — BASIC METABOLIC PANEL
Anion gap: 15 (ref 5–15)
BUN: 76 mg/dL — ABNORMAL HIGH (ref 6–20)
CO2: 28 mmol/L (ref 22–32)
Calcium: 9.8 mg/dL (ref 8.9–10.3)
Chloride: 90 mmol/L — ABNORMAL LOW (ref 98–111)
Creatinine, Ser: 7.71 mg/dL — ABNORMAL HIGH (ref 0.61–1.24)
GFR calc Af Amer: 8 mL/min — ABNORMAL LOW (ref >60)
GFR calc non Af Amer: 7 mL/min — ABNORMAL LOW (ref >60)
Glucose, Bld: 98 mg/dL (ref 70–99)
Potassium: 5.1 mmol/L (ref 3.5–5.1)
Sodium: 133 mmol/L — ABNORMAL LOW (ref 135–145)

## 2019-10-13 LAB — CBC
HCT: 35.8 % — ABNORMAL LOW (ref 39.0–52.0)
Hemoglobin: 12.4 g/dL — ABNORMAL LOW (ref 13.0–17.0)
MCH: 33.1 pg (ref 26.0–34.0)
MCHC: 34.6 g/dL (ref 30.0–36.0)
MCV: 95.5 fL (ref 80.0–100.0)
Platelets: 227 10*3/uL (ref 150–400)
RBC: 3.75 MIL/uL — ABNORMAL LOW (ref 4.22–5.81)
RDW: 15.3 % (ref 11.5–15.5)
WBC: 5 10*3/uL (ref 4.0–10.5)
nRBC: 0 % (ref 0.0–0.2)

## 2019-10-13 LAB — PHOSPHORUS: Phosphorus: 4.9 mg/dL — ABNORMAL HIGH (ref 2.5–4.6)

## 2019-10-13 MED ORDER — CARVEDILOL 12.5 MG PO TABS
12.5000 mg | ORAL_TABLET | Freq: Two times a day (BID) | ORAL | Status: DC
  Administered 2019-10-13 – 2019-10-17 (×9): 12.5 mg via ORAL
  Filled 2019-10-13 (×9): qty 1

## 2019-10-13 MED ORDER — LEVOTHYROXINE SODIUM 50 MCG PO TABS
100.0000 ug | ORAL_TABLET | Freq: Every day | ORAL | Status: DC
  Administered 2019-10-14 – 2019-10-22 (×9): 100 ug via ORAL
  Filled 2019-10-13 (×9): qty 2

## 2019-10-13 MED ORDER — ALBUTEROL SULFATE HFA 108 (90 BASE) MCG/ACT IN AERS
1.0000 | INHALATION_SPRAY | Freq: Four times a day (QID) | RESPIRATORY_TRACT | Status: DC | PRN
  Administered 2019-10-14 – 2019-10-17 (×9): 2 via RESPIRATORY_TRACT
  Administered 2019-10-18: 1 via RESPIRATORY_TRACT
  Administered 2019-10-18 – 2019-10-22 (×6): 2 via RESPIRATORY_TRACT
  Filled 2019-10-13: qty 6.7

## 2019-10-13 NOTE — BHH Counselor (Signed)
CSW called Ginette Otto, [facility], [phone] to follow up and left a message for a follow up phone call.  Assunta Curtis, MSW, LCSW 10/13/2019 2:59 PM

## 2019-10-13 NOTE — Progress Notes (Signed)
HD Complete    10/13/19 1245  Vital Signs  Temp 97.8 F (36.6 C)  Temp Source Oral  Pulse Rate 84  Pulse Rate Source Dinamap  Resp 16  BP (!) 194/90  BP Location Right Arm  BP Method Automatic  Patient Position (if appropriate) Sitting  Oxygen Therapy  SpO2 100 %  O2 Device Nasal Cannula  O2 Flow Rate (L/min) 2 L/min  During Hemodialysis Assessment  Blood Flow Rate (mL/min) 200 mL/min  KECN 78.9 KECN  Dialysis Fluid Bolus Normal Saline  Bolus Amount (mL) 250 mL  Intra-Hemodialysis Comments Tx completed  Post-Hemodialysis Assessment  Rinseback Volume (mL) 250 mL  KECN 78.9 V  Dialyzer Clearance Lightly streaked  Duration of HD Treatment -hour(s) 3.5 hour(s)  Hemodialysis Intake (mL) 500 mL  Tolerated HD Treatment Yes

## 2019-10-13 NOTE — Progress Notes (Signed)
[facility]  ROUNDING NOTE   Subjective:  Patient completed dialysis treatment today. Tolerated well. Appears to be in fair spirits.   Objective:  Vital signs in last 24 hours:  Temp:  [97.6 F (36.4 C)-97.8 F (36.6 C)] 97.8 F (36.6 C) (05/14 1250) Pulse Rate:  [73-92] 92 (05/14 1348) Resp:  [10-19] 18 (05/14 1250) BP: (153-213)/(87-118) 153/105 (05/14 1348) SpO2:  [94 %-100 %] 99 % (05/14 1250)  Weight change:  Filed Weights   10/04/19 2346 10/06/19 0915  Weight: 69.4 kg 69.4 kg    Intake/Output: No intake/output data recorded.   Intake/Output this shift:  Total I/O In: -  Out: 6000 [Other:6000]  Physical Exam: General: No acute distress  Head: Normocephalic, atraumatic. Moist oral mucosal membranes  Eyes: Anicteric  Neck: Supple, trachea midline  Lungs:  Clear to auscultation, normal effort  Heart: S1S2 no rubs  Abdomen:  Soft, nontender, bowel sounds present  Extremities: 1+ peripheral edema.  Neurologic: Awake, alert, following commands  Skin: No lesions  Access: LUE AVF    Basic Metabolic Panel: Recent Labs  Lab 10/09/19 1256 10/13/19 0640  NA  --  133*  K  --  5.1  CL  --  90*  CO2  --  28  GLUCOSE  --  98  BUN  --  76*  CREATININE  --  7.71*  CALCIUM  --  9.8  PHOS 1.8* 4.9*    Liver Function Tests: No results for input(s): AST, ALT, ALKPHOS, BILITOT, PROT, ALBUMIN in the last 168 hours. No results for input(s): LIPASE, AMYLASE in the last 168 hours. No results for input(s): AMMONIA in the last 168 hours.  CBC: Recent Labs  Lab 10/13/19 0640  WBC 5.0  HGB 12.4*  HCT 35.8*  MCV 95.5  PLT 227    Cardiac Enzymes: No results for input(s): CKTOTAL, CKMB, CKMBINDEX, TROPONINI in the last 168 hours.  BNP: Invalid input(s): POCBNP  CBG: No results for input(s): GLUCAP in the last 168 hours.  Microbiology: Results for orders placed or performed during the hospital encounter of 10/04/19  Respiratory Panel by RT  PCR (Flu A&B, Covid) - Nasopharyngeal Swab     Status: None   Collection Time: 10/04/19  6:26 PM   Specimen: Nasopharyngeal Swab  Result Value Ref Range Status   SARS Coronavirus 2 by RT PCR NEGATIVE NEGATIVE Final    Comment: (NOTE) SARS-CoV-2 target nucleic acids are NOT DETECTED. The SARS-CoV-2 RNA is generally detectable in upper respiratoy specimens during the acute phase of infection. The lowest concentration of SARS-CoV-2 viral copies this assay can detect is 131 copies/mL. A negative result does not preclude SARS-Cov-2 infection and should not be used as the sole basis for treatment or other patient management decisions. A negative result may occur with  improper specimen collection/handling, submission of specimen other than nasopharyngeal swab, presence of viral mutation(s) within the areas targeted by this assay, and inadequate number of viral copies (<131 copies/mL). A negative result must be combined with clinical observations, patient history, and epidemiological information. The expected result is Negative. Fact Sheet for Patients:  PinkCheek.be Fact Sheet for Healthcare Providers:  GravelBags.it This test is not yet ap proved or cleared by the [place] FDA and  has been authorized for detection and/or diagnosis of SARS-CoV-2 by FDA under an Emergency Use Authorization (EUA). This EUA will remain  in effect (meaning this test can be used) for the duration of the COVID-19 declaration under Section 564(b)(1) of the  Act, 21 U.S.C. section 360bbb-3(b)(1), unless the authorization is terminated or revoked sooner.    Influenza A by PCR NEGATIVE NEGATIVE Final   Influenza B by PCR NEGATIVE NEGATIVE Final    Comment: (NOTE) The Xpert Xpress SARS-CoV-2/FLU/RSV assay is intended as an aid in  the diagnosis of influenza from Nasopharyngeal swab specimens and  should not be used as a sole basis for treatment. Nasal  washings and  aspirates are unacceptable for Xpert Xpress SARS-CoV-2/FLU/RSV  testing. Fact Sheet for Patients: PinkCheek.be Fact Sheet for Healthcare Providers: GravelBags.it This test is not yet approved or cleared by the [place] FDA and  has been authorized for detection and/or diagnosis of SARS-CoV-2 by  FDA under an Emergency Use Authorization (EUA). This EUA will remain  in effect (meaning this test can be used) for the duration of the  Covid-19 declaration under Section 564(b)(1) of the Act, 21  U.S.C. section 360bbb-3(b)(1), unless the authorization is  terminated or revoked. Performed at [facility], [address] [place], [place] [address]     Coagulation Studies: No results for input(s): LABPROT, INR in the last 72 hours.  Urinalysis: No results for input(s): COLORURINE, LABSPEC, PHURINE, GLUCOSEU, HGBUR, BILIRUBINUR, KETONESUR, PROTEINUR, UROBILINOGEN, NITRITE, LEUKOCYTESUR in the last 72 hours.  Invalid input(s): APPERANCEUR    Imaging: No results found.   Medications:    . amLODipine  10 mg Oral Daily  . calcium acetate  1,334 mg Oral TID WC  . carbamazepine  1,000 mg Oral BH-q7a  . carvedilol  6.25 mg Oral BID WC  . Chlorhexidine Gluconate Cloth  6 each Topical Q0600  . cholecalciferol  1,000 Units Oral Daily  . cloNIDine  0.3 mg Oral TID  . docusate sodium  100 mg Oral BID  . ferrous sulfate  325 mg Oral Daily  . fluPHENAZine  5 mg Oral BID  . hydrALAZINE  100 mg Oral Q8H  . [START ON 10/14/2019] levothyroxine  100 mcg Oral Q0600  . mometasone-formoterol  2 puff Inhalation BID  . multivitamin  1 tablet Oral Daily  . nicotine  21 mg Transdermal Daily  . pantoprazole  40 mg Oral Daily  . temazepam  15 mg Oral QHS   acetaminophen, albuterol, heparin, senna, trihexyphenidyl  Assessment/ Plan:  59 y.o. male with past medical history of ESRD on HD MWF, hypertension,  schizophrenia, hepatitis C, history of DVT, anemia chronic kidney disease, secondary hyperparathyroidism, chronic volume overload who presented with worsening schizophrenia.  [place] Left AVF  1.  ESRD on HD MWF.  Patient completed dialysis treatment today.  Tolerated well.  Next office treatment on Monday.  2.  Anemia chronic kidney disease.  Hemoglobin currently 12.4.  Hold off on Epogen for now.  3.  Secondary hyperparathyroidism.  Phosphorus at 4.9 and at target.  Maintain the patient on calcium acetate 2 tablets p.o. 3 times daily with meals.  4.  Schizophrenia.  Management as per psychiatry.  5.  Hypertension.  Patient on multiple antihypertensives including good doses of amlodipine, carvedilol, clonidine, hydralazine.  Recommend against using minoxidil as he already has considerable lower extremity edema.  We will increase carvedilol to 12.5 mg twice daily.  Could consider use of losartan however in the past the patient has had significantly elevated potassium as an outpatient.   LOS: 9 Lexandra Rettke 5/14/20212:44 PM

## 2019-10-13 NOTE — Progress Notes (Signed)
Pre HD     10/13/19 0905  Vital Signs  Temp 97.8 F (36.6 C)  Temp Source Oral  Pulse Rate 85  Pulse Rate Source Dinamap  Resp 19  BP (!) 196/99  BP Location Right Arm  BP Method Automatic  Patient Position (if appropriate) Sitting  Oxygen Therapy  SpO2 94 %  O2 Device Room Air  Pain Assessment  Pain Scale 0-10  Pain Score 0  Dialysis Weight  Weight  (unable to weigh, no scale)  Type of Weight Pre-Dialysis  Time-Out for Hemodialysis  What Procedure? HD   Pt Identifiers(min of two) First/Last Name;MRN/Account#;Pt's DOB(use if MRN/Acct# not available  Correct Site? Yes  Correct Side? Yes  Correct Procedure? Yes  Consents Verified? Yes  Rad Studies Available? N/A  Safety Precautions Reviewed? Yes  Engineer, civil (consulting) Number 5  Station Number 2 [id number])  UF/Alarm Test Passed  Conductivity: Meter 14  Conductivity: Machine  13.9  pH 7.2  Reverse Osmosis 4 (WRO 4)  Normal Saline Lot Number [id number]  Dialyzer Lot Number [id number]  Disposable Set Lot Number [id number]  Machine Temperature 98.6 F (37 C)  Musician and Audible Yes  Blood Lines Intact and Secured Yes  Pre Treatment Patient Checks  Vascular access used during treatment Fistula  HD catheter dressing before treatment  (n/a)  Patient is receiving dialysis in a chair Yes  Hepatitis B Surface Antigen Results Negative  Date Hepatitis B Surface Antigen Drawn 05/05/17  Isolation Initiated  (no)  Hepatitis B Surface Antibody 253 (250)  Date Hepatitis B Surface Antibody Drawn 03/13/19  Hemodialysis Consent Verified Yes  Hemodialysis Standing Orders Initiated Yes  ECG (Telemetry) Monitor On Yes  Prime Ordered Normal Saline  Length of  DialysisTreatment -hour(s) 3.5 Hour(s)  Dialysis Treatment Comments  (Na 140)  Dialyzer Elisio 17H NR  Dialysate 2K;2.5 Ca  Dialysate Flow Ordered 800  Blood Flow Rate Ordered 400 mL/min  Ultrafiltration Goal 3 Liters (2.5-3)  Dialysis Blood Pressure Support  Ordered Normal Saline  Education / Care Plan  Dialysis Education Provided Yes  Documented Education in Care Plan Yes  Outpatient Plan of Care Reviewed and on Chart Yes  Fistula / Graft Left Upper arm Arteriovenous fistula  No Placement Date or Time found.   Orientation: Left  Access Location: Upper arm  Access Type: Arteriovenous fistula  Site Condition Other (Comment) (aneurysm present)  Fistula / Graft Assessment Bruit;Thrill;Present  Status Accessed  Needle Size 15  Drainage Description None

## 2019-10-13 NOTE — BHH Counselor (Signed)
Csw spoke with Cassell Smiles at [facility] who reports she has contacted pt payee and is waiting on them to disclose the amount of monthly income pt receives. Pt payee is [employer] ph: [phone]; fax [phone]. CSW left a confidental vm, awaiting response.

## 2019-10-13 NOTE — Progress Notes (Addendum)
Recreation Therapy Notes  Date: 10/13/2019  Time: 10:30 am   Location: Craft room    Behavioral response: N/A   Intervention Topic: Goals   Discussion/Intervention: Patient did not attend group.   Clinical Observations/Feedback:  Patient did not attend group.   Rayvin Abid LRT/CTRS        Orlena Garmon 10/13/2019 11:53 AM

## 2019-10-13 NOTE — [facility] Group Notes (Signed)
LCSW Group Therapy Note  10/13/2019 2:12 PM  Type of Therapy and Topic:  Group Therapy:  Feelings around Relapse and Recovery  Participation Level:  Did Not Attend   Description of Group:    Patients in this group will discuss emotions they experience before and after a relapse. They will process how experiencing these feelings, or avoidance of experiencing them, relates to having a relapse. Facilitator will guide patients to explore emotions they have related to recovery. Patients will be encouraged to process which emotions are more powerful. They will be guided to discuss the emotional reaction significant others in their lives may have to their relapse or recovery. Patients will be assisted in exploring ways to respond to the emotions of others without this contributing to a relapse.  Therapeutic Goals: 1. Patient will identify two or more emotions that lead to a relapse for them 2. Patient will identify two emotions that result when they relapse 3. Patient will identify two emotions related to recovery 4. Patient will demonstrate ability to communicate their needs through discussion and/or role plays   Summary of Patient Progress: X  Therapeutic Modalities:   Cognitive Behavioral Therapy Solution-Focused Therapy Assertiveness Training Relapse Prevention Therapy   Assunta Curtis, MSW, LCSW 10/13/2019 2:12 PM

## 2019-10-13 NOTE — Progress Notes (Signed)
[facility] MD Progress Note  10/13/2019 1:19 PM Todd Brown  MRN:  [id number] Subjective:  Patient is a 59 year old male with a past psychiatric history significant for schizophrenia as well as a past medical history significant for chronic renal disease and several other issues who was referred to the emergency department by his dialysis team out of concern for poor self-care. The patient had apparently missed 2 weeks of dialysis sessions and appeared to be agitated and aggressive. He is followed by ACTT service and they have been increasingly concerned about his physical issues. He was admitted to the psychiatric hospital for evaluation and stabilization.  Objective: Patient is seen and examined.  Patient is a 59 year old male with the above-stated past psychiatric history is seen in follow-up.  He remains basically the same.  He did have dialysis today.  His blood pressure remains elevated.  He still has his usual speech pattern.  He denied auditory or visual hallucinations.  He denied suicidal or homicidal ideation.  Social work and his ACTT service still have been unable to find a place for him to be placed.  I have suggested to the social workers to contact the nephrology social workers to see what facilities would be willing to take a patient with end-stage renal disease and dialysis.  His pulse oximetry yesterday initially was 76%, but a recheck showed that it was above 90% on room air.  He has been walking and getting around more easily.  Review of the nephrology notes stated that they were monitoring his blood pressure.  His hydralazine was increased yesterday to 100 mg p.o. every 8 hours.  Unfortunately it continues to be elevated.  This morning was initially 174/95, then 194/90, then 191/104.  His heart rate is 84.  He remains afebrile.  He did sleep 7.75 hours last night.  His chemistries from 5/14 include a sodium of 133, a potassium of 5.1, chloride of 98, BUN of 76 and creatinine of 7.71.  His  CBC still shows a mild anemia with a hemoglobin of 12.4 and hematocrit of 35.8.  His Tegretol level from 5/10 was 4.0.  His TSH from 5/10 was still 24.4, but he continues on levothyroxine 100 mcg p.o. daily.  Principal Problem: Schizophrenia ([place]) Diagnosis: Principal Problem:   Schizophrenia ([place]) Active Problems:   Essential hypertension, benign   Chronic kidney disease   Hepatitis C   End stage renal disease ([place])   Dialysis patient ([place])  Total Time spent with patient: 20 minutes  Past Psychiatric History: See admission H&P  Past Medical History:  Past Medical History:  Diagnosis Date  . Anemia   . Diabetes mellitus without complication ([place])   . Dialysis patient [facility])    Mon. -Wed.- Fri  . DVT (deep venous thrombosis) ([place])    cephalic and basolic vein thrombosis  . ESRD (end stage renal disease) ([place])   . Hyperlipidemia   . Malignant hypertension   . Renal artery stenosis ([place])   . Schizophrenia [facility])     Past Surgical History:  Procedure Laterality Date  . A/V FISTULAGRAM N/A 08/06/2016   Procedure: A/V Fistulagram;  Surgeon: Algernon Huxley, MD;  Location: [place] CV LAB;  Service: Cardiovascular;  Laterality: N/A;  . A/V SHUNT INTERVENTION N/A 08/06/2016   Procedure: A/V Shunt Intervention;  Surgeon: Algernon Huxley, MD;  Location: [place] CV LAB;  Service: Cardiovascular;  Laterality: N/A;  . AV FISTULA PLACEMENT Left 12/26/2014   Procedure: ARTERIOVENOUS (AV) FISTULA CREATION;  Surgeon:  Algernon Huxley, MD;  Location: [facility] ORS;  Service: Vascular;  Laterality: Left;  . ESOPHAGOGASTRODUODENOSCOPY (EGD) WITH PROPOFOL N/A 05/06/2017   Procedure: ESOPHAGOGASTRODUODENOSCOPY (EGD) WITH PROPOFOL;  Surgeon: Jonathon Bellows, MD;  Location: [facility]COPY;  Service: Gastroenterology;  Laterality: N/A;  . INSERTION OF DIALYSIS CATHETER Right   . PERIPHERAL VASCULAR CATHETERIZATION N/A 10/22/2014   Procedure: Dialysis/Perma Catheter Insertion;  Surgeon: Algernon Huxley, MD;  Location: [place] CV LAB;  Service: Cardiovascular;  Laterality: N/A;  . PERIPHERAL VASCULAR CATHETERIZATION N/A 02/28/2015   Procedure: Dialysis/Perma Catheter Removal;  Surgeon: Algernon Huxley, MD;  Location: [place] CV LAB;  Service: Cardiovascular;  Laterality: N/A;  . Repair fx left lower leg     yrs ago (age 25)   Family History:  Family History  Problem Relation Age of Onset  . Diabetes Neg Hx    Family Psychiatric  History: See admission H&P Social History:  Social History   Substance and Sexual Activity  Alcohol Use No     Social History   Substance and Sexual Activity  Drug Use Yes  . Frequency: 1.0 times per week  . Types: Marijuana    Social History   Socioeconomic History  . Marital status: Single    Spouse name: Not on file  . Number of children: Not on file  . Years of education: Not on file  . Highest education level: Not on file  Occupational History  . Occupation: Disability   Tobacco Use  . Smoking status: Current Every Day Smoker    Packs/day: 0.50    Types: Cigarettes  . Smokeless tobacco: Never Used  . Tobacco comment: 10 cigars a day  Substance and Sexual Activity  . Alcohol use: No  . Drug use: Yes    Frequency: 1.0 times per week    Types: Marijuana  . Sexual activity: Not Currently  Other Topics Concern  . Not on file  Social History Narrative  . Not on file   Social Determinants of Health   Financial Resource Strain:   . Difficulty of Paying Living Expenses:   Food Insecurity:   . Worried About Charity fundraiser in the Last Year:   . Arboriculturist in the Last Year:   Transportation Needs:   . Film/video editor (Medical):   Marland Kitchen Lack of Transportation (Non-Medical):   Physical Activity:   . Days of Exercise per Week:   . Minutes of Exercise per Session:   Stress:   . Feeling of Stress :   Social Connections:   . Frequency of Communication with Friends and Family:   . Frequency of Social Gatherings with Friends and Family:    . Attends Religious Services:   . Active Member of Clubs or Organizations:   . Attends Archivist Meetings:   Marland Kitchen Marital Status:    Additional Social History:                         Sleep: Good  Appetite:  Fair  Current Medications: Current Facility-Administered Medications  Medication Dose Route Frequency Provider Last Rate Last Admin  . acetaminophen (TYLENOL) tablet 500 mg  500 mg Oral Q4H PRN Caroline Sauger, NP   500 mg at 10/13/19 0809  . albuterol (PROVENTIL) (2.5 MG/3ML) 0.083% nebulizer solution 2.5 mg  2.5 mg Inhalation Q4H PRN Sharma Covert, MD   2.5 mg at 10/13/19 0225  . amLODipine (NORVASC) tablet 10 mg  10 mg Oral Daily Clapacs, Madie Reno, MD   10 mg at 10/13/19 0807  . calcium acetate (PHOSLO) capsule 1,334 mg  1,334 mg Oral TID WC Caroline Sauger, NP   1,334 mg at 10/13/19 4696  . carbamazepine (TEGRETOL XR) 12 hr tablet 1,000 mg  1,000 mg Oral Chester Holstein, Geni Bers, NP   1,000 mg at 10/13/19 0640  . carvedilol (COREG) tablet 6.25 mg  6.25 mg Oral BID WC Lateef, Munso[place], MD   6.25 mg at 10/13/19 0807  . Chlorhexidine Gluconate Cloth 2 % PADS 6 each  6 each Topical Q0600 Murlean Iba, MD      . cholecalciferol (VITAMIN D3) tablet 1,000 Units  1,000 Units Oral Daily Caroline Sauger, NP   1,000 Units at 10/13/19 0807  . cloNIDine (CATAPRES) tablet 0.3 mg  0.3 mg Oral TID Larita Fife, MD   0.3 mg at 10/13/19 0807  . docusate sodium (COLACE) capsule 100 mg  100 mg Oral BID Clapacs, Madie Reno, MD   100 mg at 10/13/19 0807  . ferrous sulfate tablet 325 mg  325 mg Oral Daily Caroline Sauger, NP   325 mg at 10/13/19 0807  . fluPHENAZine (PROLIXIN) tablet 5 mg  5 mg Oral BID Sharma Covert, MD   5 mg at 10/13/19 2952  . heparin injection 1,400 Units  20 Units/kg Dialysis PRN Murlean Iba, MD      . hydrALAZINE (APRESOLINE) tablet 100 mg  100 mg Oral Q8H Sharma Covert, MD   100 mg at 10/13/19 8413  . levothyroxine  (SYNTHROID) tablet 75 mcg  75 mcg Oral Q0600 Sharma Covert, MD   75 mcg at 10/13/19 0630  . mometasone-formoterol (DULERA) 200-5 MCG/ACT inhaler 2 puff  2 puff Inhalation BID Caroline Sauger, NP   2 puff at 10/13/19 [phone]  . multivitamin (RENA-VIT) tablet 1 tablet  1 tablet Oral Daily Caroline Sauger, NP   1 tablet at 10/13/19 0807  . nicotine (NICODERM CQ - dosed in mg/24 hours) patch 21 mg  21 mg Transdermal Daily Clapacs, Madie Reno, MD   21 mg at 10/13/19 1027  . pantoprazole (PROTONIX) EC tablet 40 mg  40 mg Oral Daily Caroline Sauger, NP   40 mg at 10/13/19 0807  . senna (SENOKOT) tablet 17.2 mg  2 tablet Oral Daily PRN Clapacs, John T, MD      . temazepam (RESTORIL) capsule 15 mg  15 mg Oral QHS Clapacs, Madie Reno, MD   15 mg at 10/12/19 2126  . trihexyphenidyl (ARTANE) tablet 2 mg  2 mg Oral Daily PRN Sharma Covert, MD   2 mg at 10/13/19 2536    Lab Results:  Results for orders placed or performed during the hospital encounter of 10/04/19 (from the past 48 hour(s))  Basic metabolic panel     Status: Abnormal   Collection Time: 10/13/19  6:40 AM  Result Value Ref Range   Sodium 133 (L) 135 - 145 mmol/L   Potassium 5.1 3.5 - 5.1 mmol/L   Chloride 90 (L) 98 - 111 mmol/L   CO2 28 22 - 32 mmol/L   Glucose, Bld 98 70 - 99 mg/dL    Comment: Glucose reference range applies only to samples taken after fasting for at least 8 hours.   BUN 76 (H) 6 - 20 mg/dL   Creatinine, Ser 7.71 (H) 0.61 - 1.24 mg/dL   Calcium 9.8 8.9 - 10.3 mg/dL   GFR calc non Af Amer 7 (L) >60 mL/min  GFR calc Af Amer 8 (L) >60 mL/min   Anion gap 15 5 - 15    Comment: Performed at [facility], [address] [place], [place] [address]  CBC     Status: Abnormal   Collection Time: 10/13/19  6:40 AM  Result Value Ref Range   WBC 5.0 4.0 - 10.5 K/uL   RBC 3.75 (L) 4.22 - 5.81 MIL/uL   Hemoglobin 12.4 (L) 13.0 - 17.0 g/dL   HCT 35.8 (L) 39.0 - 52.0 %   MCV 95.5 80.0 - 100.0 fL   MCH 33.1  26.0 - 34.0 pg   MCHC 34.6 30.0 - 36.0 g/dL   RDW 15.3 11.5 - 15.5 %   Platelets 227 150 - 400 K/uL   nRBC 0.0 0.0 - 0.2 %    Comment: Performed at [facility], [address] [place], [place] [address]  Phosphorus     Status: Abnormal   Collection Time: 10/13/19  6:40 AM  Result Value Ref Range   Phosphorus 4.9 (H) 2.5 - 4.6 mg/dL    Comment: Performed at [facility], [address] [place], [place] [address]    Blood Alcohol level:  Lab Results  Component Value Date   [facility] <10 10/04/2019   ETH <5 16/38/4665    Metabolic Disorder Labs: Lab Results  Component Value Date   HGBA1C 5.0 09/12/2018   MPG 97 09/12/2018   No results found for: PROLACTIN Lab Results  Component Value Date   CHOL 186 10/06/2019   TRIG 75 10/06/2019   HDL 77 10/06/2019   CHOLHDL 2.4 10/06/2019   VLDL 15 10/06/2019   LDLCALC 94 10/06/2019    Physical Findings: AIMS:  , ,  ,  ,    CIWA:    COWS:     Musculoskeletal: Strength & Muscle Tone: decreased Gait & Station: shuffle Patient leans: N/A  Psychiatric Specialty Exam: Physical Exam  Nursing note and vitals reviewed. Constitutional: He appears well-developed and well-nourished.  HENT:  Head: Normocephalic and atraumatic.  Respiratory: Effort normal.  Neurological: He is alert.    Review of Systems  Blood pressure (!) (P) 191/104, pulse (P) 82, temperature (P) 97.8 F (36.6 C), temperature source (P) Oral, resp. rate (P) 18, height 6\' 3"  (1.905 m), weight 69.4 kg, SpO2 (P) 99 %.Body mass index is 19.12 kg/m.  General Appearance: Casual  Eye Contact:  Fair  Speech:  Normal Rate  Volume:  Normal  Mood:  Euthymic  Affect:  Congruent  Thought Process:  Goal Directed and Descriptions of Associations: Circumstantial  Orientation:  Full (Time, Place, and Person)  Thought Content:  Negative  Suicidal Thoughts:  No  Homicidal Thoughts:  No  Memory:  Immediate;   Fair Recent;   Fair Remote;   Fair  Judgement:   Intact  Insight:  Fair  Psychomotor Activity:  Normal  Concentration:  Concentration: Fair and Attention Span: Fair  Recall:  [employer] of Knowledge:  Fair  Language:  Fair  Akathisia:  Negative  Handed:  Right  AIMS (if indicated):     Assets:  Desire for Improvement Resilience  ADL's:  Intact  Cognition:  Impaired,  Moderate  Sleep:  Number of Hours: 7.75     Treatment Plan Summary: Daily contact with patient to assess and evaluate symptoms and progress in treatment, Medication management and Plan : Patient is seen and examined.  Patient is a 59 year old male with the above-stated past psychiatric history who is  seen in follow-up.  Diagnosis: #1 schizophrenia, #2 chronic renal disease/chronic renal failure/end-stage renal disease, #3 malignant hypertension, #4 diabetes mellitus, #5 history of DVT, #6 renal artery stenosis, #7 hypothyroidism, #8 probable dementia  Patient is seen in follow-up.  He remains essentially unchanged.  He has been more physically active over the last day or so.  Some of that may be related to the retreatment with levothyroxine.  His sleep is good.  His cognitive status is improved to a degree.  His ACT kept the service as well as our social workers continue to monitor to find a location for him to be placed that.  I have recommended that they contact the renal service social workers for additional information on facilities in the area that take end-stage renal disease patients.  I consider to adding minoxidil to his blood pressure regimen today, but he is status post dialysis.  Hopefully the nephrology team will address this and adjust his medications.  No change to his psychiatric medicines today. 1. Continue albuterol as needed every 4 hours for COPD and shortness of breath. 2. Continue amlodipine 10 mg p.o. daily for hypertension. 3. Continue calcium acetate 1,334 mg secondary to dialysis and renal failure. 4. Continue Tegretol extended release 1000 mg  p.o. daily for mood stability. 5. Continue vitamin D3 1000 units p.o. daily for vitamin D deficiency. 6. Continue clonidine 0.3 mg p.o. 3 times daily for hypertension. 7. Continue Colace 100 mg p.o. twice daily for constipation. 8. Continue iron sulfate 325 mg p.o. daily for anemia. 9. Continue Prolixin 2.5 mg p.o. twice daily for schizophrenia. 10. Continue heparin injection as directed. 11. Continuehydralazine to100mg  p.o.three timesdaily for hypertension. 12. Continue Dulera 2 puffs twice daily for COPD. 13. Continue multivitamin for renal failure 1 tablet p.o. daily. 14. Continue Protonix 40 mg p.o. daily for gastric protection. 15. Continue Senokot 17.2 g daily as needed constipation. 16. Continue temazepam 15 mg p.o. nightly for insomnia. 16. Continue trazodone 50 mg p.o. nightly as needed insomnia. 17. Continue Artane 2 mg p.o. twice daily for side effects of medication. 18.Continuelevothyroxine to 162mcg p.o. daily for hypothyroidism. 19. RPR negative. 20. Physical therapy consultation completed.. 21.continue dialysis. 22.Disposition planning-in progress.  Sharma Covert, MD 10/13/2019, 1:19 PM

## 2019-10-13 NOTE — Progress Notes (Signed)
Patient alert and oriented X 4, affect is flat but he brighten upon approach, he denies SI/HI/AVH hej appears tired, tired and ws not to have SOB ON exertion often he requested for his inhaler and also nebulizer treatment. Patient after treatment expressed some relief, he's  complaint with medication regimen, he  endorsed generalized pain and ws medicated as ordered. Patient is scheduled for dialysis Friday, 15 minutes safety checks  Maintained.

## 2019-10-13 NOTE — Plan of Care (Signed)
D- Patient alert and oriented. Patient presents in a pleasant mood on assessment, and it was reported that he slept well last night. Patient continues to report whole body pain, rating his pain an "8/10", in which he requested medication from this writer. Patient endorsed anxiety, reporting that he is "worrying about when I'm gone get out the hospital". Patient denies any signs/symptoms of depression, stating "I already had my depression meds, I just need the rest it". Patient also denies SI, HI, AVH, although, he has been observed talking to himself. Patient had no stated goals for today.  A- Scheduled medications administered to patient, per MD orders. Support and encouragement provided.  Routine safety checks conducted every 15 minutes.  Patient informed to notify staff with problems or concerns.  R- No adverse drug reactions noted. Patient contracts for safety at this time. Patient compliant with medications and treatment plan. Patient receptive, calm, and cooperative. Patient interacts well with others on the unit.  Patient remains safe at this time.  Problem: Education: Goal: Knowledge of [place] [facility] Education information/materials will improve Outcome: Progressing Goal: Emotional status will improve Outcome: Progressing Goal: Mental status will improve Outcome: Progressing Goal: Verbalization of understanding the information provided will improve Outcome: Progressing   Problem: Activity: Goal: Interest or engagement in activities will improve Outcome: Progressing Goal: Sleeping patterns will improve Outcome: Progressing   Problem: Coping: Goal: Ability to verbalize frustrations and anger appropriately will improve Outcome: Progressing Goal: Ability to demonstrate self-control will improve Outcome: Progressing   Problem: Health Behavior/Discharge Planning: Goal: Identification of resources available to assist in meeting health care needs will improve Outcome:  Progressing Goal: Compliance with treatment plan for underlying cause of condition will improve Outcome: Progressing   Problem: Physical Regulation: Goal: Ability to maintain clinical measurements within normal limits will improve Outcome: Progressing   Problem: Safety: Goal: Periods of time without injury will increase Outcome: Progressing   Problem: Activity: Goal: Will identify at least one activity in which they can participate Outcome: Progressing   Problem: Coping: Goal: Ability to identify and develop effective coping behavior will improve Outcome: Progressing Goal: Ability to interact with others will improve Outcome: Progressing Goal: Demonstration of participation in decision-making regarding own care will improve Outcome: Progressing Goal: Ability to use eye contact when communicating with others will improve Outcome: Progressing   Problem: Health Behavior/Discharge Planning: Goal: Identification of resources available to assist in meeting health care needs will improve Outcome: Progressing   Problem: Self-Concept: Goal: Will verbalize positive feelings about self Outcome: Progressing

## 2019-10-13 NOTE — Progress Notes (Signed)
Report was received from Carlton [place], RN on patient's dialysis treatment. It was reported that patient had to be put on 2L of O2 during treatment, in which his saturation was 100% throughout the whole time. It was also reported that 3.5L was pulled from patient and he had no issues with treatment. Patient is A&O and will be headed back to the unit shortly.

## 2019-10-13 NOTE — Progress Notes (Signed)
HD Initiated    10/13/19 0910  Vital Signs  Temp 97.8 F (36.6 C)  Temp Source Oral  Pulse Rate 85  Pulse Rate Source Dinamap  Resp 19  BP (!) 195/108  BP Location Right Arm  BP Method Automatic  Patient Position (if appropriate) Sitting  Oxygen Therapy  SpO2 96 %  O2 Device Nasal Cannula  O2 Flow Rate (L/min) 2 L/min  Pain Assessment  Pain Scale 0-10  Pain Score 0  During Hemodialysis Assessment  Blood Flow Rate (mL/min) 400 mL/min  Arterial Pressure (mmHg) -120 mmHg  Venous Pressure (mmHg) 240 mmHg  Transmembrane Pressure (mmHg) 70 mmHg  Ultrafiltration Rate (mL/min) 1000 mL/min  Dialysate Flow Rate (mL/min) 800 ml/min  Conductivity: Machine  13.9  HD Safety Checks Performed Yes  Dialysis Fluid Bolus Normal Saline  Bolus Amount (mL) 250 mL  Intra-Hemodialysis Comments Tx initiated

## 2019-10-13 NOTE — [facility] Group Notes (Signed)
[place] Group Notes:  (Nursing/MHT/Case Management/Adjunct)  Date:  10/13/2019  Time:  8:37 PM  Type of Therapy:  Wrap-Up  Participation Level:  Minimal  Participation Quality:  Appropriate  Affect:  Appropriate  Cognitive:  Appropriate  Insight:  Appropriate and Good  Engagement in Group:  Engaged  Modes of Intervention:  Clarification  Summary of Progress/Problems:  Dierdre Forth 10/13/2019, 8:37 PM

## 2019-10-14 DIAGNOSIS — I10 Essential (primary) hypertension: Secondary | ICD-10-CM

## 2019-10-14 DIAGNOSIS — N189 Chronic kidney disease, unspecified: Secondary | ICD-10-CM

## 2019-10-14 NOTE — Progress Notes (Signed)
Pt is alert and oriented to person, place, time and situation. Pt is restless, cooperative, medication compliant, is out for meals, otherwise isolates in his room, resting quietly or sleeping in his room. Pt's appetite is good, affect is blunted, eye contact fair, pt noted to be responding to internal stimuli, self talk noted, speech is garbled. Pt denies suicidal and homicidal ideation, denies hallucinations, denies feelings of depression and anxiety. No distress noted, none reported, pt voices no complaints.

## 2019-10-14 NOTE — [facility] Group Notes (Signed)
LCSW Wellness Group Note   10/14/2019 10:45am  Type of Group and Topic: Psychoeducational Group:  Wellness  Participation Level:  Did not attend  Description of Group  Wellness group introduces the topic and its focus on developing healthy habits across the spectrum and its relationship to a decrease in hospital admissions.  Six areas of wellness are discussed: physical, social spiritual, intellectual, occupational, and emotional.  Patients are asked to consider their current wellness habits and to identify areas of wellness where they are interested and able to focus on improvements.    Therapeutic Goals 1. Patients will understand components of wellness and how they can positively impact overall health.  2. Patients will identify areas of wellness where they have developed good habits. 3. Patients will identify areas of wellness where they would like to make improvements.    Summary of Patient Progress     Therapeutic Modalities: Cognitive Behavioral Therapy Psychoeducation    Joanne Chars, LCSW

## 2019-10-14 NOTE — Progress Notes (Signed)
[facility] MD Progress Note  10/14/2019 3:56 PM Todd Brown  MRN:  [id number] Subjective:  Patient is a 59 year old male with a past psychiatric history significant for schizophrenia as well as a past medical history significant for chronic renal disease and several other issues who was referred to the emergency department by his dialysis team out of concern for poor self-care. The patient had apparently missed 2 weeks of dialysis sessions and appeared to be agitated and aggressive. He is followed by ACTT service and they have been increasingly concerned about his physical issues. He was admitted to the psychiatric hospital for evaluation and stabilization.  Objective: Patient is seen and examined.  Patient is a 59 year old male with the above-stated past psychiatric history is seen in follow-up.  Patient is alert, and oriented x4.  He is aware that tomorrow is his birthday.  He is fixated on his medical needs, most specifically his asthma, but denies shortness of breath or wheezing; and his need for more dialysis treatment.  Patient is hopeful to have his dialysis treatment moved up so that he can be discharged to his ACTT team for his birthday.  Patient is hyperverbal, however speech is garbled making it difficult to understand him.  He does seem to get worked up when he has to repeat himself until his thoughts are clear.  Patient specifically denies any suicidal or homicidal ideation.  He is denying any auditory or visual hallucinations today.  He does not seem to have delusions.  He is disappointed at but understands that he will not be discharged over the weekend until care and placement is solidified.   Social work and his ACTT service still have been unable to find a place for him to be placed.  Recommended working with nephrology social worker in order to identify a facility. Medical: His blood pressure has been difficult to control.   His chemistries from 5/14 include a sodium of 133, a potassium  of 5.1, chloride of 98, BUN of 76 and creatinine of 7.71.  His CBC still shows a mild anemia with a hemoglobin of 12.4 and hematocrit of 35.8.  His Tegretol level from 5/10 was 4.0.  His TSH from 5/10 was still 24.4, but he continues on levothyroxine 100 mcg p.o. daily.  Principal Problem: Schizophrenia ([place]) Diagnosis: Principal Problem:   Schizophrenia ([place]) Active Problems:   Essential hypertension, benign   Chronic kidney disease   Hepatitis C   End stage renal disease ([facility])   Dialysis patient ([place])  Total Time spent with patient: 30 minutes  Past Psychiatric History: See admission H&P  Past Medical History:  Past Medical History:  Diagnosis Date  . Anemia   . Diabetes mellitus without complication ([place])   . Dialysis patient [facility])    Mon. -Wed.- Fri  . DVT (deep venous thrombosis) ([place])    cephalic and basolic vein thrombosis  . ESRD (end stage renal disease) ([place])   . Hyperlipidemia   . Malignant hypertension   . Renal artery stenosis ([place])   . Schizophrenia [facility])     Past Surgical History:  Procedure Laterality Date  . A/V FISTULAGRAM N/A 08/06/2016   Procedure: A/V Fistulagram;  Surgeon: Algernon Huxley, MD;  Location: [place] [facility];  Service: Cardiovascular;  Laterality: N/A;  . A/V SHUNT INTERVENTION N/A 08/06/2016   Procedure: A/V Shunt Intervention;  Surgeon: Algernon Huxley, MD;  Location: [place] [facility];  Service: Cardiovascular;  Laterality: N/A;  . AV FISTULA PLACEMENT Left 12/26/2014  Procedure: ARTERIOVENOUS (AV) FISTULA CREATION;  Surgeon: Algernon Huxley, MD;  Location: [facility] ORS;  Service: Vascular;  Laterality: Left;  . ESOPHAGOGASTRODUODENOSCOPY (EGD) WITH PROPOFOL N/A 05/06/2017   Procedure: ESOPHAGOGASTRODUODENOSCOPY (EGD) WITH PROPOFOL;  Surgeon: Jonathon Bellows, MD;  Location: [facility] ENDOSCOPY;  Service: Gastroenterology;  Laterality: N/A;  . INSERTION OF DIALYSIS CATHETER Right   . PERIPHERAL VASCULAR CATHETERIZATION N/A 10/22/2014   Procedure: Dialysis/Perma  Catheter Insertion;  Surgeon: Algernon Huxley, MD;  Location: [place] [facility];  Service: Cardiovascular;  Laterality: N/A;  . PERIPHERAL VASCULAR CATHETERIZATION N/A 02/28/2015   Procedure: Dialysis/Perma Catheter Removal;  Surgeon: Algernon Huxley, MD;  Location: [place] [facility];  Service: Cardiovascular;  Laterality: N/A;  . Repair fx left lower leg     yrs ago (age 56)   Family History:  Family History  Problem Relation Age of Onset  . Diabetes Neg Hx    Family Psychiatric  History: See admission H&P Social History:  Social History   Substance and Sexual Activity  Alcohol Use No     Social History   Substance and Sexual Activity  Drug Use Yes  . Frequency: 1.0 times per week  . Types: Marijuana    Social History   Socioeconomic History  . Marital status: Single    Spouse name: Not on file  . Number of children: Not on file  . Years of education: Not on file  . Highest education level: Not on file  Occupational History  . Occupation: Disability   Tobacco Use  . Smoking status: Current Every Day Smoker    Packs/day: 0.50    Types: Cigarettes  . Smokeless tobacco: Never Used  . Tobacco comment: 10 cigars a day  Substance and Sexual Activity  . Alcohol use: No  . Drug use: Yes    Frequency: 1.0 times per week    Types: Marijuana  . Sexual activity: Not Currently  Other Topics Concern  . Not on file  Social History Narrative  . Not on file   Social Determinants of Health   Financial Resource Strain:   . Difficulty of Paying Living Expenses:   Food Insecurity:   . Worried About Charity fundraiser in the Last Year:   . Arboriculturist in the Last Year:   Transportation Needs:   . Film/video editor (Medical):   Marland Kitchen Lack of Transportation (Non-Medical):   Physical Activity:   . Days of Exercise per Week:   . Minutes of Exercise per Session:   Stress:   . Feeling of Stress :   Social Connections:   . Frequency of Communication with Friends and Family:    . Frequency of Social Gatherings with Friends and Family:   . Attends Religious Services:   . Active Member of Clubs or Organizations:   . Attends Archivist Meetings:   Marland Kitchen Marital Status:    Additional Social History:             Needs placement.            Sleep: Good  Appetite:  Fair  Current Medications: Current Facility-Administered Medications  Medication Dose Route Frequency Provider Last Rate Last Admin  . acetaminophen (TYLENOL) tablet 500 mg  500 mg Oral Q4H PRN Caroline Sauger, NP   500 mg at 10/14/19 1222  . albuterol (VENTOLIN HFA) 108 (90 Base) MCG/ACT inhaler 1-2 puff  1-2 puff Inhalation Q6H PRN Sharma Covert, MD      .  amLODipine (NORVASC) tablet 10 mg  10 mg Oral Daily Clapacs, Madie Reno, MD   10 mg at 10/14/19 0809  . calcium acetate (PHOSLO) capsule 1,334 mg  1,334 mg Oral TID WC Caroline Sauger, NP   1,334 mg at 10/14/19 1218  . carbamazepine (TEGRETOL XR) 12 hr tablet 1,000 mg  1,000 mg Oral [place] Holstein, Geni Bers, NP   1,000 mg at 10/14/19 0658  . carvedilol (COREG) tablet 12.5 mg  12.5 mg Oral BID WC Lateef, [place], MD   12.5 mg at 10/14/19 0811  . Chlorhexidine Gluconate Cloth 2 % PADS 6 each  6 each Topical Q0600 Murlean Iba, MD      . cholecalciferol (VITAMIN D3) tablet 1,000 Units  1,000 Units Oral Daily Caroline Sauger, NP   1,000 Units at 10/14/19 0802  . cloNIDine (CATAPRES) tablet 0.3 mg  0.3 mg Oral TID Larita Fife, MD   0.3 mg at 10/14/19 1229  . docusate sodium (COLACE) capsule 100 mg  100 mg Oral BID Clapacs, Madie Reno, MD   100 mg at 10/14/19 0810  . ferrous sulfate tablet 325 mg  325 mg Oral Daily Caroline Sauger, NP   325 mg at 10/14/19 0810  . fluPHENAZine (PROLIXIN) tablet 5 mg  5 mg Oral BID Sharma Covert, MD   5 mg at 10/14/19 7672  . heparin injection 1,400 Units  20 Units/kg Dialysis PRN Murlean Iba, MD      . hydrALAZINE (APRESOLINE) tablet 100 mg  100 mg Oral Q8H Sharma Covert,  MD   100 mg at 10/14/19 1229  . levothyroxine (SYNTHROID) tablet 100 mcg  100 mcg Oral Q0600 Sharma Covert, MD   100 mcg at 10/14/19 0656  . mometasone-formoterol (DULERA) 200-5 MCG/ACT inhaler 2 puff  2 puff Inhalation BID Caroline Sauger, NP   2 puff at 10/14/19 0947  . multivitamin (RENA-VIT) tablet 1 tablet  1 tablet Oral Daily Caroline Sauger, NP   1 tablet at 10/14/19 0813  . nicotine (NICODERM CQ - dosed in mg/24 hours) patch 21 mg  21 mg Transdermal Daily Clapacs, Madie Reno, MD   21 mg at 10/14/19 0814  . pantoprazole (PROTONIX) EC tablet 40 mg  40 mg Oral Daily Caroline Sauger, NP   40 mg at 10/14/19 0810  . senna (SENOKOT) tablet 17.2 mg  2 tablet Oral Daily PRN Clapacs, Madie Reno, MD   17.2 mg at 10/13/19 2139  . temazepam (RESTORIL) capsule 15 mg  15 mg Oral QHS Clapacs, Madie Reno, MD   15 mg at 10/13/19 2139  . trihexyphenidyl (ARTANE) tablet 2 mg  2 mg Oral Daily PRN Sharma Covert, MD   2 mg at 10/14/19 0815    Lab Results:  Results for orders placed or performed during the hospital encounter of 10/04/19 (from the past 48 hour(s))  Basic metabolic panel     Status: Abnormal   Collection Time: 10/13/19  6:40 AM  Result Value Ref Range   Sodium 133 (L) 135 - 145 mmol/L   Potassium 5.1 3.5 - 5.1 mmol/L   Chloride 90 (L) 98 - 111 mmol/L   CO2 28 22 - 32 mmol/L   Glucose, Bld 98 70 - 99 mg/dL    Comment: Glucose reference range applies only to samples taken after fasting for at least 8 hours.   BUN 76 (H) 6 - 20 mg/dL   Creatinine, Ser 7.71 (H) 0.61 - 1.24 mg/dL   Calcium 9.8 8.9 - 10.3 mg/dL  GFR calc non Af Amer 7 (L) >60 mL/min   GFR calc Af Amer 8 (L) >60 mL/min   Anion gap 15 5 - 15    Comment: Performed at [facility], [address] [place], [place] [address]  CBC     Status: Abnormal   Collection Time: 10/13/19  6:40 AM  Result Value Ref Range   WBC 5.0 4.0 - 10.5 K/uL   RBC 3.75 (L) 4.22 - 5.81 MIL/uL   Hemoglobin 12.4 (L) 13.0 - 17.0  g/dL   HCT 35.8 (L) 39.0 - 52.0 %   MCV 95.5 80.0 - 100.0 fL   MCH 33.1 26.0 - 34.0 pg   MCHC 34.6 30.0 - 36.0 g/dL   RDW 15.3 11.5 - 15.5 %   Platelets 227 150 - 400 K/uL   nRBC 0.0 0.0 - 0.2 %    Comment: Performed at [facility], [address] [place], [place] [address]  Phosphorus     Status: Abnormal   Collection Time: 10/13/19  6:40 AM  Result Value Ref Range   Phosphorus 4.9 (H) 2.5 - 4.6 mg/dL    Comment: Performed at [facility], [address] [place], [place] [address]    Blood Alcohol level:  Lab Results  Component Value Date   [facility] <10 10/04/2019   ETH <5 67/20/9470    Metabolic Disorder Labs: Lab Results  Component Value Date   HGBA1C 5.0 09/12/2018   MPG 97 09/12/2018   No results found for: PROLACTIN Lab Results  Component Value Date   CHOL 186 10/06/2019   TRIG 75 10/06/2019   HDL 77 10/06/2019   CHOLHDL 2.4 10/06/2019   VLDL 15 10/06/2019   LDLCALC 94 10/06/2019    Physical Findings: AIMS:  , ,  ,  ,    CIWA:    COWS:     Musculoskeletal: Strength & Muscle Tone: decreased Gait & Station: shuffle Patient leans: N/A  Psychiatric Specialty Exam: Physical Exam  Nursing note and vitals reviewed. Constitutional: He appears well-developed and well-nourished.  HENT:  Head: Normocephalic and atraumatic.  Respiratory: Effort normal. No respiratory distress.  Neurological: He is alert.    Review of Systems  Constitutional: Negative.   Respiratory: Negative.   Cardiovascular: Negative.   Neurological: Negative.   Psychiatric/Behavioral: Positive for dysphoric mood and sleep disturbance. Negative for agitation, behavioral problems, confusion, decreased concentration, hallucinations, self-injury and suicidal ideas. The patient is not nervous/anxious and is not hyperactive.     Blood pressure (!) 223/112, pulse 79, temperature 97.6 F (36.4 C), temperature source Oral, resp. rate 16, height 6\' 3"  (1.905 m), weight 69.4 kg,  SpO2 100 %.Body mass index is 19.12 kg/m.  General Appearance: Casual  Eye Contact:  Fair  Speech:  Normal Rate  Volume:  Normal  Mood:  Irritable, but redirectable.  Appropriate with peers and staff  Affect:  Congruent  Thought Process:  Goal Directed and Descriptions of Associations: Circumstantial  Orientation:  Full (Time, Place, and Person)  Thought Content:  Negative  Suicidal Thoughts:  No  Homicidal Thoughts:  No  Memory:  Immediate;   Fair Recent;   Fair Remote;   Fair  Judgement:  Intact  Insight:  Fair  Psychomotor Activity:  Normal  Concentration:  Concentration: Fair and Attention Span: Fair  Recall:  [employer] of Knowledge:  Fair  Language:  Fair  Akathisia:  Negative  Handed:  Right  AIMS (if indicated):     Assets:  Desire for Improvement Resilience  ADL's:  Intact  Cognition:  Impaired,  Moderate  Sleep:  Number of Hours: 5.15     Treatment Plan Summary: Daily contact with patient to assess and evaluate symptoms and progress in treatment, Medication management and Plan : Patient is seen and examined.  Patient is a 59 year old male with the above-stated past psychiatric history who is seen in follow-up.  Diagnosis: #1 schizophrenia, #2 chronic renal disease/chronic renal failure/end-stage renal disease, #3 malignant hypertension, #4 diabetes mellitus, #5 history of DVT, #6 renal artery stenosis, #7 hypothyroidism, #8 probable dementia  Patient is seen in follow-up.  He remains essentially unchanged, and generally frustrated from prolonged hospitalization. His cognitive status is improved to a degree.  His ACT kept the service as well as our social workers continue to monitor to find a location for him to be placed that.  Blood pressures have been difficult to manage, hopefully the nephrology team will address this and adjust his medications.  No change to his psychiatric medicines today. 1. Continue albuterol as needed every 4 hours for COPD and shortness of  breath. 2. Continue amlodipine 10 mg p.o. daily for hypertension. 3. Continue calcium acetate 1,334 mg secondary to dialysis and renal failure. 4. Continue Tegretol extended release 1000 mg p.o. daily for mood stability. 5. Continue vitamin D3 1000 units p.o. daily for vitamin D deficiency. 6. Continue clonidine 0.3 mg p.o. 3 times daily for hypertension. 7. Continue Colace 100 mg p.o. twice daily for constipation. 8. Continue iron sulfate 325 mg p.o. daily for anemia. 9. Continue Prolixin 2.5 mg p.o. twice daily for schizophrenia. 10. Continue heparin injection as directed. 11. Continuehydralazine to100mg  p.o.three timesdaily for hypertension. 12. Continue Dulera 2 puffs twice daily for COPD. 13. Continue multivitamin for renal failure 1 tablet p.o. daily. 14. Continue Protonix 40 mg p.o. daily for gastric protection. 15. Continue Senokot 17.2 g daily as needed constipation. 16. Continue temazepam 15 mg p.o. nightly for insomnia. 16. Continue trazodone 50 mg p.o. nightly as needed insomnia. 17. Continue Artane 2 mg p.o. twice daily for side effects of medication. 18.Continuelevothyroxine to 152mcg p.o. daily for hypothyroidism. 19. RPR negative. 20. Physical therapy consultation completed.. 21.continue dialysis. 22.Disposition planning-in progress.  Lavella Hammock, MD 10/14/2019, 3:56 PM

## 2019-10-14 NOTE — Progress Notes (Signed)
Patient alert and oriented X 4, affect is flat but he brighten upon approach, he denies SI/HI/AVH he appears restless c/o SOB on exertion often he requested for his inhaler. Patient after treatment expressed some relief, he's  complaint with medication regimen, he  endorsed generalized pain and ws medicated as ordered. 15 minutes safety checks maintained.will contijnue to monitor.

## 2019-10-14 NOTE — Tx Team (Signed)
Interdisciplinary Treatment and Diagnostic Plan Update  10/14/2019 Time of Session: [place] MRN: [id number]  Principal Diagnosis: Schizophrenia [facility])  Secondary Diagnoses: Principal Problem:   Schizophrenia ([place]) Active Problems:   Essential hypertension, benign   Chronic kidney disease   Hepatitis C   End stage renal disease ([place])   Dialysis patient ([place])   Current Medications:  Current Facility-Administered Medications  Medication Dose Route Frequency Provider Last Rate Last Admin  . acetaminophen (TYLENOL) tablet 500 mg  500 mg Oral Q4H PRN Caroline Sauger, NP   500 mg at 10/14/19 0701  . albuterol (VENTOLIN HFA) 108 (90 Base) MCG/ACT inhaler 1-2 puff  1-2 puff Inhalation Q6H PRN Sharma Covert, MD      . amLODipine (NORVASC) tablet 10 mg  10 mg Oral Daily Clapacs, Madie Reno, MD   10 mg at 10/14/19 0809  . calcium acetate (PHOSLO) capsule 1,334 mg  1,334 mg Oral TID WC Caroline Sauger, NP   1,334 mg at 10/14/19 0811  . carbamazepine (TEGRETOL XR) 12 hr tablet 1,000 mg  1,000 mg Oral [place] Holstein, Geni Bers, NP   1,000 mg at 10/14/19 0658  . carvedilol (COREG) tablet 12.5 mg  12.5 mg Oral BID WC Lateef, [place], MD   12.5 mg at 10/14/19 0811  . Chlorhexidine Gluconate Cloth 2 % PADS 6 each  6 each Topical Q0600 Murlean Iba, MD      . cholecalciferol (VITAMIN D3) tablet 1,000 Units  1,000 Units Oral Daily Caroline Sauger, NP   1,000 Units at 10/14/19 0802  . cloNIDine (CATAPRES) tablet 0.3 mg  0.3 mg Oral TID Larita Fife, MD   0.3 mg at 10/14/19 0810  . docusate sodium (COLACE) capsule 100 mg  100 mg Oral BID Clapacs, Madie Reno, MD   100 mg at 10/14/19 0810  . ferrous sulfate tablet 325 mg  325 mg Oral Daily Caroline Sauger, NP   325 mg at 10/14/19 0810  . fluPHENAZine (PROLIXIN) tablet 5 mg  5 mg Oral BID Sharma Covert, MD   5 mg at 10/14/19 4696  . heparin injection 1,400 Units  20 Units/kg Dialysis PRN Murlean Iba, MD      .  hydrALAZINE (APRESOLINE) tablet 100 mg  100 mg Oral Q8H Sharma Covert, MD   100 mg at 10/14/19 0656  . levothyroxine (SYNTHROID) tablet 100 mcg  100 mcg Oral Q0600 Sharma Covert, MD   100 mcg at 10/14/19 0656  . mometasone-formoterol (DULERA) 200-5 MCG/ACT inhaler 2 puff  2 puff Inhalation BID Caroline Sauger, NP   2 puff at 10/14/19 2952  . multivitamin (RENA-VIT) tablet 1 tablet  1 tablet Oral Daily Caroline Sauger, NP   1 tablet at 10/14/19 0813  . nicotine (NICODERM CQ - dosed in mg/24 hours) patch 21 mg  21 mg Transdermal Daily Clapacs, Madie Reno, MD   21 mg at 10/14/19 0814  . pantoprazole (PROTONIX) EC tablet 40 mg  40 mg Oral Daily Caroline Sauger, NP   40 mg at 10/14/19 0810  . senna (SENOKOT) tablet 17.2 mg  2 tablet Oral Daily PRN Clapacs, Madie Reno, MD   17.2 mg at 10/13/19 2139  . temazepam (RESTORIL) capsule 15 mg  15 mg Oral QHS Clapacs, Madie Reno, MD   15 mg at 10/13/19 2139  . trihexyphenidyl (ARTANE) tablet 2 mg  2 mg Oral Daily PRN Sharma Covert, MD   2 mg at 10/14/19 0815   PTA Medications: Medications Prior to Admission  Medication Sig Dispense Refill Last Dose  . acetaminophen (TYLENOL) 500 MG tablet Take 500 mg by mouth every 4 (four) hours as needed for mild pain or moderate pain.      Marland Kitchen albuterol (PROAIR HFA) 108 (90 Base) MCG/ACT inhaler Inhale 1-2 puffs into the lungs every 4 (four) hours as needed for wheezing or shortness of breath.      Marland Kitchen amLODipine (NORVASC) 10 MG tablet Take 1 tablet (10 mg total) by mouth daily.     . calcium acetate (PHOSLO) 667 MG capsule Take 2 capsules (1,334 mg total) by mouth 3 (three) times daily with meals. 60 capsule 1   . carbamazepine (TEGRETOL XR) 200 MG 12 hr tablet Take 1,000 mg by mouth every morning.     . celecoxib (CELEBREX) 200 MG capsule Take 200 mg by mouth daily as needed for mild pain.     . cholecalciferol (VITAMIN D) 1000 UNITS tablet Take 1,000 Units by mouth daily.     . cloNIDine (CATAPRES) 0.3 MG  tablet Take 0.3 mg by mouth 3 (three) times daily.     . ferrous sulfate 325 (65 FE) MG tablet Take 1 tablet (325 mg total) by mouth daily. 30 tablet 3   . fluPHENAZine (PROLIXIN) 2.5 MG tablet Take 2.5 mg by mouth 2 (two) times daily.      . Fluticasone-Salmeterol (ADVAIR) 500-50 MCG/DOSE AEPB Inhale 1 puff into the lungs 2 (two) times daily.     . hydrALAZINE (APRESOLINE) 100 MG tablet Take 1 tablet (100 mg total) by mouth 2 (two) times daily.     . minoxidil (LONITEN) 10 MG tablet Take 10 mg by mouth 2 (two) times daily.     . multivitamin (RENA-VIT) TABS tablet Take 1 tablet by mouth daily.     Marland Kitchen omeprazole (PRILOSEC) 40 MG capsule Take 1 capsule (40 mg total) by mouth daily. 30 capsule 1   . oxyCODONE (ROXICODONE) 5 MG immediate release tablet Take 1 tablet (5 mg total) by mouth every 8 (eight) hours as needed. 10 tablet 0   . trihexyphenidyl (ARTANE) 2 MG tablet Take 2 mg by mouth 2 (two) times daily.       Patient Stressors: Health problems Medication change or noncompliance  Patient Strengths: Capable of independent living General fund of knowledge  Treatment Modalities: Medication Management, Group therapy, Case management,  1 to 1 session with clinician, Psychoeducation, Recreational therapy.   Physician Treatment Plan for Primary Diagnosis: Schizophrenia ([place]) Long Term Goal(s): Improvement in symptoms so as ready for discharge Improvement in symptoms so as ready for discharge   Short Term Goals: Ability to verbalize feelings will improve Ability to demonstrate self-control will improve Ability to identify and develop effective coping behaviors will improve Ability to maintain clinical measurements within normal limits will improve Compliance with prescribed medications will improve  Medication Management: Evaluate patient's response, side effects, and tolerance of medication regimen.  Therapeutic Interventions: 1 to 1 sessions, Unit Group sessions and Medication  administration.  Evaluation of Outcomes: Progressing  Physician Treatment Plan for Secondary Diagnosis: Principal Problem:   Schizophrenia ([place]) Active Problems:   Essential hypertension, benign   Chronic kidney disease   Hepatitis C   End stage renal disease ([place])   Dialysis patient ([place])  Long Term Goal(s): Improvement in symptoms so as ready for discharge Improvement in symptoms so as ready for discharge   Short Term Goals: Ability to verbalize feelings will improve Ability to demonstrate self-control will improve Ability to identify and  develop effective coping behaviors will improve Ability to maintain clinical measurements within normal limits will improve Compliance with prescribed medications will improve     Medication Management: Evaluate patient's response, side effects, and tolerance of medication regimen.  Therapeutic Interventions: 1 to 1 sessions, Unit Group sessions and Medication administration.  Evaluation of Outcomes: Progressing   RN Treatment Plan for Primary Diagnosis: Schizophrenia ([place]) Long Term Goal(s): Knowledge of disease and therapeutic regimen to maintain health will improve  Short Term Goals: Ability to identify and develop effective coping behaviors will improve and Compliance with prescribed medications will improve  Medication Management: RN will administer medications as ordered by provider, will assess and evaluate patient's response and provide education to patient for prescribed medication. RN will report any adverse and/or side effects to prescribing provider.  Therapeutic Interventions: 1 on 1 counseling sessions, Psychoeducation, Medication administration, Evaluate responses to treatment, Monitor vital signs and CBGs as ordered, Perform/monitor CIWA, COWS, AIMS and Fall Risk screenings as ordered, Perform wound care treatments as ordered.  Evaluation of Outcomes: Progressing   LCSW Treatment Plan for Primary Diagnosis: Schizophrenia  ([place]) Long Term Goal(s): Safe transition to appropriate next level of care at discharge, Engage patient in therapeutic group addressing interpersonal concerns.  Short Term Goals: Engage patient in aftercare planning with referrals and resources  Therapeutic Interventions: Assess for all discharge needs, 1 to 1 time with Social worker, Explore available resources and support systems, Assess for adequacy in community support network, Educate family and significant other(s) on suicide prevention, Complete Psychosocial Assessment, Interpersonal group therapy.  Evaluation of Outcomes: Progressing   Progress in Treatment: Attending groups: No. Participating in groups: No. Taking medication as prescribed: Yes. Toleration medication: Yes. Family/Significant other contact made: Yes, individual(s) contacted:  pt declined Patient understands diagnosis: No. Discussing patient identified problems/goals with staff: Yes. Medical problems stabilized or resolved: No. Denies suicidal/homicidal ideation: Yes Issues/concerns per patient self-inventory: No. Other: NA  New problem(s) identified: No, Describe:  None reported  New Short Term/Long Term Goal(s):Attend outpatient treatment, develop and implement healthy coping methods, take medication as prescribed.  Patient Goals:  "I dont have a goal"  Discharge Plan or Barriers: Pt speaks gibberish, difficult to understand what he is saying. However, pt has an ACT Team at [facility]. DC plan TBD. Update 10/10/19- Pt's ACT Team reports they will contact DSS to pursue guardianship. Team reports concerns about pt not being able to properly care for himself and live alone. Team states they are working to secure placement at [facility], assisted living facility.  Reason for Continuation of Hospitalization: Medical Issues Medication stabilization  Estimated Length of Stay: 5-7 days.   Recreational Therapy: Patient Stressors: N/A Patient Goal: Patient will  engage in groups without prompting or encouragement from LRT x3 group sessions within 5 recreation therapy group sessions  Attendees: Patient:  10/14/2019   Physician: Dr. Leverne Humbles, MD 10/14/2019   Nursing: Mitzi Hansen, RN 10/14/2019   RN Care Manager: 10/14/2019   Social Worker: Lurline Idol, [place] 10/14/2019   Recreational Therapist:  10/14/2019   Other: Oretha Ellis, LCSW 10/14/2019   Other:  10/14/2019   Other: 10/14/2019        Scribe for Treatment Team: Joanne Chars, [place] 10/14/2019 10:15 AM

## 2019-10-15 NOTE — Plan of Care (Signed)
  Problem: Education: Goal: Knowledge of [facility] Education information/materials will improve Outcome: Progressing Goal: Emotional status will improve Outcome: Progressing Goal: Mental status will improve Outcome: Progressing Goal: Verbalization of understanding the information provided will improve Outcome: Progressing   

## 2019-10-15 NOTE — [facility] Group Notes (Signed)
LCSW Group Therapy Note 10/15/2019 1:15pm  Type of Therapy and Topic: Group Therapy: Feelings Around Returning Home & Establishing a Supportive Framework and Supporting Oneself When Supports Not Available  Participation Level: Did Not Attend  Description of Group:  Patients first processed thoughts and feelings about upcoming discharge. These included fears of upcoming changes, lack of change, new living environments, judgements and expectations from others and overall stigma of mental health issues. The group then discussed the definition of a supportive framework, what that looks and feels like, and how do to discern it from an unhealthy non-supportive network. The group identified different types of supports as well as what to do when your family/friends are less than helpful or unavailable  Therapeutic Goals  1. Patient will identify one healthy supportive network that they can use at discharge. 2. Patient will identify one factor of a supportive framework and how to tell it from an unhealthy network. 3. Patient able to identify one coping skill to use when they do not have positive supports from others. 4. Patient will demonstrate ability to communicate their needs through discussion and/or role plays.  Summary of Patient Progress:  Patient did not attend group.   Therapeutic Modalities Cognitive Behavioral Therapy Motivational Interviewing   Cheree Ditto, LCSW 10/15/2019 12:35 PM

## 2019-10-15 NOTE — Progress Notes (Signed)
Patient has been calm and cooperative. Medication compliant. Still focused on getting his Tylenol as often as he can. Denies SI, HI and AVH. Talked about how hypertension and kidney disease run in his family and he has had two brothers die while on dialysis

## 2019-10-15 NOTE — Progress Notes (Addendum)
Pt is alert and oriented to person, and place. Pt is calm, cooperative, denies suicidal and homicidal ideation, denies hallucinations, however pt is noted to be responding to internal stimuli, self-talk noted, with mumbling and garbled speech. Pt is medication compliant, does not initiate interaction with peers, isolates in his room between meals resting quietly or sleeping. Pt denies suicidal and homicidal ideation, denies hallucinations, denies feelings of depression and anxiety. No distress noted, none reported, pt voices no complaints. Will continue to monitor pt per Q15 minute face checks and monitor for safety and progress.

## 2019-10-15 NOTE — Progress Notes (Signed)
[facility] MD Progress Note  10/15/2019 10:33 AM JON LALL  MRN:  [id number] Subjective:  Patient is a 59 year old male with a past psychiatric history significant for schizophrenia as well as a past medical history significant for chronic renal disease and several other issues who was referred to the emergency department by his dialysis team out of concern for poor self-care. The patient had apparently missed 2 weeks of dialysis sessions and appeared to be agitated and aggressive. He is followed by ACTT service and they have been increasingly concerned about his physical issues. He was admitted to the psychiatric hospital for evaluation and stabilization.  Objective: Patient is seen and examined.  Patient is a 59 year old male with the above-stated past psychiatric history is seen in follow-up.  Patient's birthday is today, he believes that he is 59 years old.  He states that he is not sure if he will live another year, as he has very many health problems. Patient is alert, and oriented x4.  His speech is garbled making it difficult to understand, however with repetition he is able to make his thoughts clear.  He spends a majority of time in his room, but does come out for meals.  He denies SI, HI, AVH.  He does not appear to have any fixed delusions.  Social work and his ACTT service still have been unable to find a place for him to be placed.  Recommended working with nephrology social worker in order to identify a facility. Medical: His blood pressure has been difficult to control.   His chemistries from 5/14 include a sodium of 133, a potassium of 5.1, chloride of 98, BUN of 76 and creatinine of 7.71.  His CBC still shows a mild anemia with a hemoglobin of 12.4 and hematocrit of 35.8.  His Tegretol level from 5/10 was 4.0.  His TSH from 5/10 was still 24.4, but he continues on levothyroxine 100 mcg p.o. daily.  Principal Problem: Schizophrenia ([place]) Diagnosis: Principal Problem:   Schizophrenia  ([place]) Active Problems:   Essential hypertension, benign   Chronic kidney disease   Hepatitis C   End stage renal disease ([place])   Dialysis patient ([place])  Total Time spent with patient: 25 minutes  Past Psychiatric History: See admission H&P  Past Medical History:  Past Medical History:  Diagnosis Date  . Anemia   . Diabetes mellitus without complication ([place])   . Dialysis patient [facility])    Mon. -Wed.- Fri  . DVT (deep venous thrombosis) ([place])    cephalic and basolic vein thrombosis  . ESRD (end stage renal disease) ([place])   . Hyperlipidemia   . Malignant hypertension   . Renal artery stenosis ([place])   . Schizophrenia [facility])     Past Surgical History:  Procedure Laterality Date  . A/V FISTULAGRAM N/A 08/06/2016   Procedure: A/V Fistulagram;  Surgeon: Algernon Huxley, MD;  Location: [place] CV LAB;  Service: Cardiovascular;  Laterality: N/A;  . A/V SHUNT INTERVENTION N/A 08/06/2016   Procedure: A/V Shunt Intervention;  Surgeon: Algernon Huxley, MD;  Location: [place] CV LAB;  Service: Cardiovascular;  Laterality: N/A;  . AV FISTULA PLACEMENT Left 12/26/2014   Procedure: ARTERIOVENOUS (AV) FISTULA CREATION;  Surgeon: Algernon Huxley, MD;  Location: [facility] ORS;  Service: Vascular;  Laterality: Left;  . ESOPHAGOGASTRODUODENOSCOPY (EGD) WITH PROPOFOL N/A 05/06/2017   Procedure: ESOPHAGOGASTRODUODENOSCOPY (EGD) WITH PROPOFOL;  Surgeon: Jonathon Bellows, MD;  Location: [facility] ENDOSCOPY;  Service: Gastroenterology;  Laterality: N/A;  . INSERTION  OF DIALYSIS CATHETER Right   . PERIPHERAL VASCULAR CATHETERIZATION N/A 10/22/2014   Procedure: Dialysis/Perma Catheter Insertion;  Surgeon: Algernon Huxley, MD;  Location: [place] CV LAB;  Service: Cardiovascular;  Laterality: N/A;  . PERIPHERAL VASCULAR CATHETERIZATION N/A 02/28/2015   Procedure: Dialysis/Perma Catheter Removal;  Surgeon: Algernon Huxley, MD;  Location: [place] CV LAB;  Service: Cardiovascular;  Laterality: N/A;  . Repair fx left lower leg      yrs ago (age 68)   Family History:  Family History  Problem Relation Age of Onset  . Diabetes Neg Hx    Family Psychiatric  History: See admission H&P Social History:  Social History   Substance and Sexual Activity  Alcohol Use No     Social History   Substance and Sexual Activity  Drug Use Yes  . Frequency: 1.0 times per week  . Types: Marijuana    Social History   Socioeconomic History  . Marital status: Single    Spouse name: Not on file  . Number of children: Not on file  . Years of education: Not on file  . Highest education level: Not on file  Occupational History  . Occupation: Disability   Tobacco Use  . Smoking status: Current Every Day Smoker    Packs/day: 0.50    Types: Cigarettes  . Smokeless tobacco: Never Used  . Tobacco comment: 10 cigars a day  Substance and Sexual Activity  . Alcohol use: No  . Drug use: Yes    Frequency: 1.0 times per week    Types: Marijuana  . Sexual activity: Not Currently  Other Topics Concern  . Not on file  Social History Narrative  . Not on file   Social Determinants of Health   Financial Resource Strain:   . Difficulty of Paying Living Expenses:   Food Insecurity:   . Worried About Charity fundraiser in the Last Year:   . Arboriculturist in the Last Year:   Transportation Needs:   . Film/video editor (Medical):   Marland Kitchen Lack of Transportation (Non-Medical):   Physical Activity:   . Days of Exercise per Week:   . Minutes of Exercise per Session:   Stress:   . Feeling of Stress :   Social Connections:   . Frequency of Communication with Friends and Family:   . Frequency of Social Gatherings with Friends and Family:   . Attends Religious Services:   . Active Member of Clubs or Organizations:   . Attends Archivist Meetings:   Marland Kitchen Marital Status:    Additional Social History:             Needs placement to facility that can meet his medical needs.            Sleep: Good  Appetite:   Fair  Current Medications: Current Facility-Administered Medications  Medication Dose Route Frequency Provider Last Rate Last Admin  . acetaminophen (TYLENOL) tablet 500 mg  500 mg Oral Q4H PRN Caroline Sauger, NP   500 mg at 10/15/19 0858  . albuterol (VENTOLIN HFA) 108 (90 Base) MCG/ACT inhaler 1-2 puff  1-2 puff Inhalation Q6H PRN Sharma Covert, MD   2 puff at 10/15/19 0349  . amLODipine (NORVASC) tablet 10 mg  10 mg Oral Daily Clapacs, Madie Reno, MD   10 mg at 10/15/19 0817  . calcium acetate (PHOSLO) capsule 1,334 mg  1,334 mg Oral TID WC Caroline Sauger, NP   1,334 mg  at 10/15/19 0753  . carbamazepine (TEGRETOL XR) 12 hr tablet 1,000 mg  1,000 mg Oral [place] Holstein, Geni Bers, NP   1,000 mg at 10/15/19 2585  . carvedilol (COREG) tablet 12.5 mg  12.5 mg Oral BID WC Lateef, [place], MD   12.5 mg at 10/15/19 0817  . Chlorhexidine Gluconate Cloth 2 % PADS 6 each  6 each Topical Q0600 Murlean Iba, MD      . cholecalciferol (VITAMIN D3) tablet 1,000 Units  1,000 Units Oral Daily Caroline Sauger, NP   1,000 Units at 10/15/19 0752  . cloNIDine (CATAPRES) tablet 0.3 mg  0.3 mg Oral TID Larita Fife, MD   0.3 mg at 10/15/19 0816  . docusate sodium (COLACE) capsule 100 mg  100 mg Oral BID Clapacs, Madie Reno, MD   100 mg at 10/15/19 0752  . ferrous sulfate tablet 325 mg  325 mg Oral Daily Caroline Sauger, NP   325 mg at 10/15/19 0752  . fluPHENAZine (PROLIXIN) tablet 5 mg  5 mg Oral BID Sharma Covert, MD   5 mg at 10/15/19 0754  . heparin injection 1,400 Units  20 Units/kg Dialysis PRN Murlean Iba, MD      . hydrALAZINE (APRESOLINE) tablet 100 mg  100 mg Oral Q8H Sharma Covert, MD   100 mg at 10/15/19 0701  . levothyroxine (SYNTHROID) tablet 100 mcg  100 mcg Oral Q0600 Sharma Covert, MD   100 mcg at 10/15/19 0701  . mometasone-formoterol (DULERA) 200-5 MCG/ACT inhaler 2 puff  2 puff Inhalation BID Caroline Sauger, NP   2 puff at 10/15/19 0754  .  multivitamin (RENA-VIT) tablet 1 tablet  1 tablet Oral Daily Caroline Sauger, NP   1 tablet at 10/15/19 0755  . nicotine (NICODERM CQ - dosed in mg/24 hours) patch 21 mg  21 mg Transdermal Daily Clapacs, Madie Reno, MD   21 mg at 10/15/19 0755  . pantoprazole (PROTONIX) EC tablet 40 mg  40 mg Oral Daily Caroline Sauger, NP   40 mg at 10/15/19 0753  . senna (SENOKOT) tablet 17.2 mg  2 tablet Oral Daily PRN Clapacs, Madie Reno, MD   17.2 mg at 10/13/19 2139  . temazepam (RESTORIL) capsule 15 mg  15 mg Oral QHS Clapacs, Madie Reno, MD   15 mg at 10/14/19 2144  . trihexyphenidyl (ARTANE) tablet 2 mg  2 mg Oral Daily PRN Sharma Covert, MD   2 mg at 10/15/19 0815    Lab Results:  No results found for this or any previous visit (from the past 48 hour(s)).  Blood Alcohol level:  Lab Results  Component Value Date   ETH <10 10/04/2019   ETH <5 27/78/2423    Metabolic Disorder Labs: Lab Results  Component Value Date   HGBA1C 5.0 09/12/2018   MPG 97 09/12/2018   No results found for: PROLACTIN Lab Results  Component Value Date   CHOL 186 10/06/2019   TRIG 75 10/06/2019   HDL 77 10/06/2019   CHOLHDL 2.4 10/06/2019   VLDL 15 10/06/2019   LDLCALC 94 10/06/2019    Physical Findings: AIMS:  , ,  ,  ,    CIWA:    COWS:     Musculoskeletal: Strength & Muscle Tone: decreased Gait & Station: shuffle Patient leans: N/A  Psychiatric Specialty Exam: Physical Exam  Nursing note and vitals reviewed. Constitutional: He appears well-developed and well-nourished.  HENT:  Head: Normocephalic and atraumatic.  Respiratory: Effort normal. No respiratory distress.  Neurological: He  is alert.    Review of Systems  Constitutional: Negative.   Respiratory: Negative.   Cardiovascular: Negative.   Neurological: Negative.   Psychiatric/Behavioral: Positive for dysphoric mood and sleep disturbance. Negative for agitation, behavioral problems, confusion, decreased concentration, hallucinations,  self-injury and suicidal ideas. The patient is not nervous/anxious and is not hyperactive.     Blood pressure (!) 211/111, pulse 83, temperature 98.4 F (36.9 C), temperature source Oral, resp. rate 16, height 6\' 3"  (1.905 m), weight 69.4 kg, SpO2 96 %.Body mass index is 19.12 kg/m.  General Appearance: Casual  Eye Contact:  Fair  Speech:  Normal Rate  Volume:  Normal  Mood:  Euthymic  Affect:  Congruent  Thought Process:  Goal Directed and Descriptions of Associations: Circumstantial  Orientation:  Full (Time, Place, and Person)  Thought Content:  Hallucinations: None  Suicidal Thoughts:  No  Homicidal Thoughts:  No  Memory:  Immediate;   Fair Recent;   Fair Remote;   Fair  Judgement:  Intact  Insight:  Fair  Psychomotor Activity:  Normal  Concentration:  Concentration: Fair and Attention Span: Fair  Recall:  [employer] of Knowledge:  Fair  Language:  Fair  Akathisia:  Negative  Handed:  Right  AIMS (if indicated):     Assets:  Desire for Improvement Resilience  ADL's:  Intact  Cognition:  Impaired,  Moderate  Sleep:  Number of Hours: 5.15     Treatment Plan Summary: Daily contact with patient to assess and evaluate symptoms and progress in treatment, Medication management and Plan : Patient is seen and examined.  Patient is a 59 year old male with the above-stated past psychiatric history who is seen in follow-up.  Diagnosis: #1 schizophrenia, #2 chronic renal disease/chronic renal failure/end-stage renal disease, #3 malignant hypertension, #4 diabetes mellitus, #5 history of DVT, #6 renal artery stenosis, #7 hypothyroidism, #8 probable dementia  Patient is seen in follow-up.  He remains essentially unchanged, and generally frustrated from prolonged hospitalization. His cognitive status is improved to a degree.  His ACT kept the service as well as our social workers continue to monitor to find a location for him to be placed that.  Blood pressures have been difficult to  manage, hopefully the nephrology team will address this and adjust his medications.  No change to his psychiatric medicines today. 1. Continue albuterol as needed every 4 hours for COPD and shortness of breath. 2. Continue amlodipine 10 mg p.o. daily for hypertension. 3. Continue calcium acetate 1,334 mg secondary to dialysis and renal failure. 4. Continue Tegretol extended release 1000 mg p.o. daily for mood stability. 5. Continue vitamin D3 1000 units p.o. daily for vitamin D deficiency. 6. Continue clonidine 0.3 mg p.o. 3 times daily for hypertension. 7. Continue Colace 100 mg p.o. twice daily for constipation. 8. Continue iron sulfate 325 mg p.o. daily for anemia. 9. Continue Prolixin 2.5 mg p.o. twice daily for schizophrenia. 10. Continue heparin injection as directed. 11. Continuehydralazine to100mg  p.o.three timesdaily for hypertension. 12. Continue Dulera 2 puffs twice daily for COPD. 13. Continue multivitamin for renal failure 1 tablet p.o. daily. 14. Continue Protonix 40 mg p.o. daily for gastric protection. 15. Continue Senokot 17.2 g daily as needed constipation. 16. Continue temazepam 15 mg p.o. nightly for insomnia. 16. Continue trazodone 50 mg p.o. nightly as needed insomnia. 17. Continue Artane 2 mg p.o. twice daily for side effects of medication. 18.Continuelevothyroxine to 166mcg p.o. daily for hypothyroidism. 19. RPR negative. 20. Physical therapy consultation completed.. 21.continue dialysis.  22.Disposition planning-in progress.  Lavella Hammock, MD 10/15/2019, 10:33 AM

## 2019-10-15 NOTE — BH Assessment (Signed)
[place] Group Notes:  (Nursing/MHT/Case Management/Adjunct)  Date:  10/15/2019  Time:  9:25 PM  Type of Therapy:  Group Therapy  Participation Level:  Did Not Attend  Todd Brown 10/15/2019, 9:25 PM

## 2019-10-16 LAB — RENAL FUNCTION PANEL
Albumin: 3.8 g/dL (ref 3.5–5.0)
Anion gap: 16 — ABNORMAL HIGH (ref 5–15)
BUN: 91 mg/dL — ABNORMAL HIGH (ref 6–20)
CO2: 25 mmol/L (ref 22–32)
Calcium: 10.1 mg/dL (ref 8.9–10.3)
Chloride: 94 mmol/L — ABNORMAL LOW (ref 98–111)
Creatinine, Ser: 9.27 mg/dL — ABNORMAL HIGH (ref 0.61–1.24)
GFR calc Af Amer: 6 mL/min — ABNORMAL LOW (ref >60)
GFR calc non Af Amer: 6 mL/min — ABNORMAL LOW (ref >60)
Glucose, Bld: 86 mg/dL (ref 70–99)
Phosphorus: 4.8 mg/dL — ABNORMAL HIGH (ref 2.5–4.6)
Potassium: 5.2 mmol/L — ABNORMAL HIGH (ref 3.5–5.1)
Sodium: 135 mmol/L (ref 135–145)

## 2019-10-16 LAB — CBC
HCT: 31.2 % — ABNORMAL LOW (ref 39.0–52.0)
Hemoglobin: 10.9 g/dL — ABNORMAL LOW (ref 13.0–17.0)
MCH: 33 pg (ref 26.0–34.0)
MCHC: 34.9 g/dL (ref 30.0–36.0)
MCV: 94.5 fL (ref 80.0–100.0)
Platelets: 216 10*3/uL (ref 150–400)
RBC: 3.3 MIL/uL — ABNORMAL LOW (ref 4.22–5.81)
RDW: 15 % (ref 11.5–15.5)
WBC: 5.6 10*3/uL (ref 4.0–10.5)
nRBC: 0 % (ref 0.0–0.2)

## 2019-10-16 MED ORDER — HYDROXYZINE HCL 50 MG PO TABS
50.0000 mg | ORAL_TABLET | Freq: Four times a day (QID) | ORAL | Status: DC | PRN
  Administered 2019-10-16 – 2019-10-22 (×8): 50 mg via ORAL
  Filled 2019-10-16 (×8): qty 1

## 2019-10-16 MED ORDER — TRIHEXYPHENIDYL HCL 2 MG PO TABS
2.0000 mg | ORAL_TABLET | Freq: Two times a day (BID) | ORAL | Status: DC
  Administered 2019-10-17 – 2019-10-22 (×12): 2 mg via ORAL
  Filled 2019-10-16 (×13): qty 1

## 2019-10-16 NOTE — Progress Notes (Signed)
Pre HD     10/16/19 0805  Vital Signs  Temp 98.2 F (36.8 C)  Temp Source Oral  Pulse Rate 69  Pulse Rate Source Dinamap  Resp 18  Oxygen Therapy  SpO2 98 %  O2 Device Room Air  Pain Assessment  Pain Scale 0-10  Pain Score 0  Dialysis Weight  Weight  (uanble to weigh pt)  Type of Weight Pre-Dialysis  Time-Out for Hemodialysis  What Procedure? HD   Pt Identifiers(min of two) First/Last Name;MRN/Account#;Pt's DOB(use if MRN/Acct# not available  Correct Site? Yes  Correct Side? Yes  Correct Procedure? Yes  Consents Verified? Yes  Rad Studies Available? N/A  Safety Precautions Reviewed? Yes  Engineer, civil (consulting) Number [place] Number  [phone])  UF/Alarm Test Passed  Conductivity: Meter 14  Conductivity: Machine  13.9  pH 7.2  Reverse Osmosis 4  Normal Saline Lot Number [id number]  Dialyzer Lot Number [id number]  Disposable Set Lot Number [id number]  Machine Temperature 98.6 F (37 C)  Musician and Audible Yes  Blood Lines Intact and Secured Yes  Pre Treatment Patient Checks  Vascular access used during treatment Fistula  HD catheter dressing before treatment  (n/a)  Patient is receiving dialysis in a chair Yes  Hepatitis B Surface Antigen Results Negative  Date Hepatitis B Surface Antigen Drawn 03/21/19  Isolation Initiated  (no)  Hepatitis B Surface Antibody  (>10)  Date Hepatitis B Surface Antibody Drawn 03/21/19  Hemodialysis Consent Verified Yes  Hemodialysis Standing Orders Initiated Yes  ECG (Telemetry) Monitor On Yes  Prime Ordered Normal Saline  Length of  DialysisTreatment -hour(s) 3.5 Hour(s)  Dialysis Treatment Comments  (Na 140)  Dialyzer Elisio 17H NR  Dialysate 2K;2.5 Ca  Dialysate Flow Ordered 800  Blood Flow Rate Ordered 450 mL/min  Ultrafiltration Goal 2.5 Liters  Dialysis Blood Pressure Support Ordered Normal Saline  Education / Care Plan  Dialysis Education Provided Yes  Documented Education in Care Plan Yes  Outpatient  Plan of Care Reviewed and on Chart Yes  Fistula / Graft Left Upper arm Arteriovenous fistula  No Placement Date or Time found.   Orientation: Left  Access Location: Upper arm  Access Type: Arteriovenous fistula  Site Condition No complications;Other (Comment) (scabs)  Fistula / Graft Assessment Bruit;Thrill;Present  Status Accessed  Needle Size 15  Drainage Description None

## 2019-10-16 NOTE — Progress Notes (Signed)
Report was received from [place], RN on patient's treatment today. It was reported that 2.5L were taken off patient, in which he received 2L of O2 during the treatment, and tolerated it well. Patient will be headed back to the unit shortly. Patient's BP was 198/94 and will be rechecked before returning back to the unit.

## 2019-10-16 NOTE — Progress Notes (Signed)
Pt is calm and cooperative, but seemed sad and worried over his discharge location. He was greeted at the beginning of the shift in his room where he was sleeping. Pt explained how he's used to living alone in his apartment, but the doctor discussed placing him in a facility. Pt doesn't want to stay in a facility. His speech is slurred and it's hard to understand him. He denies SI and HI. He denies AVH. Medications were administered as ordered by MD. Active listening, reassurance, and support was provided. Q 15 minute safety checks continue.

## 2019-10-16 NOTE — Progress Notes (Signed)
Pre HD Assessment    10/16/19 0806  Neurological  Level of Consciousness Alert  Orientation Level Oriented to person;Oriented to place;Oriented to situation  Respiratory  Respiratory Pattern Regular;Unlabored  Cardiac  Pulse Regular  Heart Sounds S1, S2  Cardiac Rhythm NSR  Vascular  R Radial Pulse +2  L Radial Pulse +2  Integumentary  Integumentary (WDL) X  Skin Color Appropriate for ethnicity  Skin Condition Dry;Flaky  Skin Integrity Other (Comment) (fistula in left arm)  Musculoskeletal  Musculoskeletal (WDL) WDL  GU Assessment  Genitourinary (WDL) X  Psychosocial  Psychosocial (WDL) X  Emotional support given Given to patient

## 2019-10-16 NOTE — BHH Group Notes (Signed)
Balance In Life 10/16/2019 1PM  Type of Therapy/Topic:  Group Therapy:  Balance in Life  Participation Level:  Did Not Attend  Description of Group:   This group will address the concept of balance and how it feels and looks when one is unbalanced. Patients will be encouraged to process areas in their lives that are out of balance and identify reasons for remaining unbalanced. Facilitators will guide patients in utilizing problem-solving interventions to address and correct the stressor making their life unbalanced. Understanding and applying boundaries will be explored and addressed for obtaining and maintaining a balanced life. Patients will be encouraged to explore ways to assertively make their unbalanced needs known to significant others in their lives, using other group members and facilitator for support and feedback.  Therapeutic Goals: 1. Patient will identify two or more emotions or situations they have that consume much of in their lives. 2. Patient will identify signs/triggers that life has become out of balance:  3. Patient will identify two ways to set boundaries in order to achieve balance in their lives:  4. Patient will demonstrate ability to communicate their needs through discussion and/or role plays  Summary of Patient Progress:    Therapeutic Modalities:   Cognitive Behavioral Therapy Solution-Focused Therapy Assertiveness Training  [place] Lynelle Smoke, LCSW

## 2019-10-16 NOTE — Progress Notes (Signed)
Recreation Therapy Notes  Date: 10/16/2019  Time: 9:30 am   Location: Craft room    Behavioral response: N/A   Intervention Topic: Coping Skills   Discussion/Intervention: Patient did not attend group.   Clinical Observations/Feedback:  Patient did not attend group.   Nakkia Mackiewicz LRT/CTRS        Haja Crego 10/16/2019 11:10 AM

## 2019-10-16 NOTE — Plan of Care (Signed)
Patient had dialysis today.Patient stated that his anxiety was high last night and he could not sleep.Patient likes to take something foe anxiety before he go to bed today.Denies SI,HI and AVH.No irritable behaviors noted.Compliant with medications.Appetite good.Energy level fair.Support and encouragement given.

## 2019-10-16 NOTE — BHH Counselor (Signed)
CSW attempted to [employer] at [phone], left confidential vm, awaiting response. CSW and ACTT working to find out pt monthly income.

## 2019-10-16 NOTE — Progress Notes (Signed)
[facility] MD Progress Note  10/16/2019 5:53 PM Todd Brown  MRN:  [id number] Subjective: Follow-up for this 59 year old man with a history of schizophrenia.  Patient has been waiting for placement.  He tells me today he is feeling quite bad because he is anxious and nervous.  He feels that he needs to have his Artane scheduled more frequently and have as needed medicine.  He stays withdrawn most of the time.  He continues to get dialysis regularly.  Blood pressure stays pretty high even though he is now on multiple high-dose antihypertensive medicines Principal Problem: Schizophrenia ([place]) Diagnosis: Principal Problem:   Schizophrenia ([place]) Active Problems:   Essential hypertension, benign   Chronic kidney disease   Hepatitis C   End stage renal disease ([place])   Dialysis patient ([place])  Total Time spent with patient: 30 minutes  Past Psychiatric History: Past history of schizophrenia  Past Medical History:  Past Medical History:  Diagnosis Date  . Anemia   . Diabetes mellitus without complication ([place])   . Dialysis patient [facility])    Mon. -Wed.- Fri  . DVT (deep venous thrombosis) ([place])    cephalic and basolic vein thrombosis  . ESRD (end stage renal disease) ([place])   . Hyperlipidemia   . Malignant hypertension   . Renal artery stenosis ([place])   . Schizophrenia [facility])     Past Surgical History:  Procedure Laterality Date  . A/V FISTULAGRAM N/A 08/06/2016   Procedure: A/V Fistulagram;  Surgeon: Algernon Huxley, MD;  Location: [place] CV LAB;  Service: Cardiovascular;  Laterality: N/A;  . A/V SHUNT INTERVENTION N/A 08/06/2016   Procedure: A/V Shunt Intervention;  Surgeon: Algernon Huxley, MD;  Location: [place] CV LAB;  Service: Cardiovascular;  Laterality: N/A;  . AV FISTULA PLACEMENT Left 12/26/2014   Procedure: ARTERIOVENOUS (AV) FISTULA CREATION;  Surgeon: Algernon Huxley, MD;  Location: [facility] ORS;  Service: Vascular;  Laterality: Left;  . ESOPHAGOGASTRODUODENOSCOPY (EGD) WITH PROPOFOL  N/A 05/06/2017   Procedure: ESOPHAGOGASTRODUODENOSCOPY (EGD) WITH PROPOFOL;  Surgeon: Jonathon Bellows, MD;  Location: [facility] ENDOSCOPY;  Service: Gastroenterology;  Laterality: N/A;  . INSERTION OF DIALYSIS CATHETER Right   . PERIPHERAL VASCULAR CATHETERIZATION N/A 10/22/2014   Procedure: Dialysis/Perma Catheter Insertion;  Surgeon: Algernon Huxley, MD;  Location: [place] CV LAB;  Service: Cardiovascular;  Laterality: N/A;  . PERIPHERAL VASCULAR CATHETERIZATION N/A 02/28/2015   Procedure: Dialysis/Perma Catheter Removal;  Surgeon: Algernon Huxley, MD;  Location: [place] CV LAB;  Service: Cardiovascular;  Laterality: N/A;  . Repair fx left lower leg     yrs ago (age 3)   Family History:  Family History  Problem Relation Age of Onset  . Diabetes Neg Hx    Family Psychiatric  History: See previous Social History:  Social History   Substance and Sexual Activity  Alcohol Use No     Social History   Substance and Sexual Activity  Drug Use Yes  . Frequency: 1.0 times per week  . Types: Marijuana    Social History   Socioeconomic History  . Marital status: Single    Spouse name: Not on file  . Number of children: Not on file  . Years of education: Not on file  . Highest education level: Not on file  Occupational History  . Occupation: Disability   Tobacco Use  . Smoking status: Current Every Day Smoker    Packs/day: 0.50    Types: Cigarettes  . Smokeless tobacco: Never Used  .  Tobacco comment: 10 cigars a day  Substance and Sexual Activity  . Alcohol use: No  . Drug use: Yes    Frequency: 1.0 times per week    Types: Marijuana  . Sexual activity: Not Currently  Other Topics Concern  . Not on file  Social History Narrative  . Not on file   Social Determinants of Health   Financial Resource Strain:   . Difficulty of Paying Living Expenses:   Food Insecurity:   . Worried About Charity fundraiser in the Last Year:   . Arboriculturist in the Last Year:   Transportation  Needs:   . Film/video editor (Medical):   Marland Kitchen Lack of Transportation (Non-Medical):   Physical Activity:   . Days of Exercise per Week:   . Minutes of Exercise per Session:   Stress:   . Feeling of Stress :   Social Connections:   . Frequency of Communication with Friends and Family:   . Frequency of Social Gatherings with Friends and Family:   . Attends Religious Services:   . Active Member of Clubs or Organizations:   . Attends Archivist Meetings:   Marland Kitchen Marital Status:    Additional Social History:                         Sleep: Fair  Appetite:  Fair  Current Medications: Current Facility-Administered Medications  Medication Dose Route Frequency Provider Last Rate Last Admin  . acetaminophen (TYLENOL) tablet 500 mg  500 mg Oral Q4H PRN Caroline Sauger, NP   500 mg at 10/16/19 1647  . albuterol (VENTOLIN HFA) 108 (90 Base) MCG/ACT inhaler 1-2 puff  1-2 puff Inhalation Q6H PRN Sharma Covert, MD   2 puff at 10/16/19 1251  . amLODipine (NORVASC) tablet 10 mg  10 mg Oral Daily Toneka Fullen, Madie Reno, MD   10 mg at 10/16/19 1217  . calcium acetate (PHOSLO) capsule 1,334 mg  1,334 mg Oral TID WC Caroline Sauger, NP   1,334 mg at 10/16/19 1644  . carbamazepine (TEGRETOL XR) 12 hr tablet 1,000 mg  1,000 mg Oral Chester Holstein, Geni Bers, NP   1,000 mg at 10/16/19 0656  . carvedilol (COREG) tablet 12.5 mg  12.5 mg Oral BID WC Lateef, Munsoor, MD   12.5 mg at 10/16/19 1641  . Chlorhexidine Gluconate Cloth 2 % PADS 6 each  6 each Topical Q0600 Murlean Iba, MD   6 each at 10/16/19 0706  . cholecalciferol (VITAMIN D3) tablet 1,000 Units  1,000 Units Oral Daily Caroline Sauger, NP   1,000 Units at 10/16/19 1249  . cloNIDine (CATAPRES) tablet 0.3 mg  0.3 mg Oral TID Larita Fife, MD   0.3 mg at 10/16/19 1641  . docusate sodium (COLACE) capsule 100 mg  100 mg Oral BID Nillie Bartolotta, Madie Reno, MD   100 mg at 10/16/19 1643  . ferrous sulfate tablet 325 mg  325 mg  Oral Daily Caroline Sauger, NP   325 mg at 10/16/19 1250  . fluPHENAZine (PROLIXIN) tablet 5 mg  5 mg Oral BID Sharma Covert, MD   5 mg at 10/16/19 1643  . heparin injection 1,400 Units  20 Units/kg Dialysis PRN Murlean Iba, MD      . hydrALAZINE (APRESOLINE) tablet 100 mg  100 mg Oral Q8H Sharma Covert, MD   100 mg at 10/16/19 1449  . hydrOXYzine (ATARAX/VISTARIL) tablet 50 mg  50 mg Oral Q6H PRN  Hensley Aziz, Madie Reno, MD      . levothyroxine (SYNTHROID) tablet 100 mcg  100 mcg Oral Q0600 Sharma Covert, MD   100 mcg at 10/16/19 0656  . mometasone-formoterol (DULERA) 200-5 MCG/ACT inhaler 2 puff  2 puff Inhalation BID Caroline Sauger, NP   2 puff at 10/16/19 0752  . multivitamin (RENA-VIT) tablet 1 tablet  1 tablet Oral Daily Caroline Sauger, NP   1 tablet at 10/15/19 0755  . nicotine (NICODERM CQ - dosed in mg/24 hours) patch 21 mg  21 mg Transdermal Daily Demont Linford, Madie Reno, MD   21 mg at 10/16/19 0756  . pantoprazole (PROTONIX) EC tablet 40 mg  40 mg Oral Daily Caroline Sauger, NP   40 mg at 10/16/19 1249  . senna (SENOKOT) tablet 17.2 mg  2 tablet Oral Daily PRN Dnya Hickle, Madie Reno, MD   17.2 mg at 10/16/19 1257  . temazepam (RESTORIL) capsule 15 mg  15 mg Oral QHS Teralyn Mullins, Madie Reno, MD   15 mg at 10/15/19 2126  . [START ON 10/17/2019] trihexyphenidyl (ARTANE) tablet 2 mg  2 mg Oral BID Sidni Fusco, Madie Reno, MD        Lab Results:  Results for orders placed or performed during the hospital encounter of 10/04/19 (from the past 48 hour(s))  Renal function panel     Status: Abnormal   Collection Time: 10/16/19  6:46 AM  Result Value Ref Range   Sodium 135 135 - 145 mmol/L   Potassium 5.2 (H) 3.5 - 5.1 mmol/L   Chloride 94 (L) 98 - 111 mmol/L   CO2 25 22 - 32 mmol/L   Glucose, Bld 86 70 - 99 mg/dL    Comment: Glucose reference range applies only to samples taken after fasting for at least 8 hours.   BUN 91 (H) 6 - 20 mg/dL   Creatinine, Ser 9.27 (H) 0.61 - 1.24 mg/dL    Calcium 10.1 8.9 - 10.3 mg/dL   Phosphorus 4.8 (H) 2.5 - 4.6 mg/dL   Albumin 3.8 3.5 - 5.0 g/dL   GFR calc non Af Amer 6 (L) >60 mL/min   GFR calc Af Amer 6 (L) >60 mL/min   Anion gap 16 (H) 5 - 15    Comment: Performed at [facility], [address] [place], [place] [address]  CBC     Status: Abnormal   Collection Time: 10/16/19  6:46 AM  Result Value Ref Range   WBC 5.6 4.0 - 10.5 K/uL   RBC 3.30 (L) 4.22 - 5.81 MIL/uL   Hemoglobin 10.9 (L) 13.0 - 17.0 g/dL   HCT 31.2 (L) 39.0 - 52.0 %   MCV 94.5 80.0 - 100.0 fL   MCH 33.0 26.0 - 34.0 pg   MCHC 34.9 30.0 - 36.0 g/dL   RDW 15.0 11.5 - 15.5 %   Platelets 216 150 - 400 K/uL   nRBC 0.0 0.0 - 0.2 %    Comment: Performed at [facility], [address] [place], [place] [address]    Blood Alcohol level:  Lab Results  Component Value Date   [facility] <10 10/04/2019   ETH <5 24/40/1027    Metabolic Disorder Labs: Lab Results  Component Value Date   HGBA1C 5.0 09/12/2018   MPG 97 09/12/2018   No results found for: PROLACTIN Lab Results  Component Value Date   CHOL 186 10/06/2019   TRIG 75 10/06/2019   HDL 77 10/06/2019   CHOLHDL 2.4 10/06/2019   VLDL 15  10/06/2019   Echo 94 10/06/2019    Physical Findings: AIMS:  , ,  ,  ,    CIWA:    COWS:     Musculoskeletal: Strength & Muscle Tone: within normal limits Gait & Station: normal Patient leans: N/A  Psychiatric Specialty Exam: Physical Exam  Nursing note and vitals reviewed. Constitutional: He appears well-developed and well-nourished.  HENT:  Head: Normocephalic and atraumatic.  Eyes: Pupils are equal, round, and reactive to light. Conjunctivae are normal.  Cardiovascular: Normal heart sounds.  Respiratory: Effort normal.  GI: Soft.  Musculoskeletal:        General: Normal range of motion.     Cervical back: Normal range of motion.  Neurological: He is alert.  Skin: Skin is warm and dry.  Psychiatric: His mood appears anxious. His  affect is blunt. His speech is tangential. He is withdrawn. Thought content is not paranoid. Cognition and memory are impaired. He expresses impulsivity. He expresses no homicidal ideation.    Review of Systems  Constitutional: Negative.   HENT: Negative.   Eyes: Negative.   Respiratory: Negative.   Cardiovascular: Negative.   Gastrointestinal: Negative.   Musculoskeletal: Negative.   Skin: Negative.   Neurological: Negative.   Psychiatric/Behavioral: Positive for dysphoric mood. The patient is nervous/anxious.     Blood pressure (!) 159/86, pulse 98, temperature 98.2 F (36.8 C), temperature source Oral, resp. rate 12, height 6\' 3"  (1.905 m), weight 69.4 kg, SpO2 99 %.Body mass index is 19.12 kg/m.  General Appearance: Casual  Eye Contact:  Fair  Speech:  Garbled and Slow  Volume:  Decreased  Mood:  Dysphoric  Affect:  Constricted  Thought Process:  Disorganized  Orientation:  Full (Time, Place, and Person)  Thought Content:  Illogical, Rumination and Tangential  Suicidal Thoughts:  No  Homicidal Thoughts:  No  Memory:  Immediate;   Good Recent;   Poor Remote;   Fair  Judgement:  Impaired  Insight:  Shallow  Psychomotor Activity:  Decreased  Concentration:  Concentration: Poor  Recall:  [employer] of Knowledge:  Fair  Language:  Fair  Akathisia:  No  Handed:  Right  AIMS (if indicated):     Assets:  Desire for Improvement  ADL's:  Impaired  Cognition:  Impaired,  Mild  Sleep:  Number of Hours: 5     Treatment Plan Summary: Daily contact with patient to assess and evaluate symptoms and progress in treatment, Medication management and Plan Supportive therapy and counseling.  Encourage patient to try and be patient with this while his act team works on finding some place.  I will not adjust his blood pressure medicine yet today as he is already on such high doses.  I will add twice a day scheduled Artane and as needed hydroxyzine.  Alethia Berthold, MD 10/16/2019, 5:53  PM

## 2019-10-16 NOTE — Progress Notes (Signed)
HD Initiated    10/16/19 0814  Vital Signs  Temp 98.2 F (36.8 C)  Temp Source Oral  Pulse Rate 70  Pulse Rate Source Dinamap  Resp 18  BP  (Unable to record BP)  Oxygen Therapy  SpO2 98 %  O2 Device Room Air  Pain Assessment  Pain Scale 0-10  Pain Score 0  During Hemodialysis Assessment  Blood Flow Rate (mL/min) 400 mL/min  Arterial Pressure (mmHg) -180 mmHg  Venous Pressure (mmHg) 220 mmHg  Transmembrane Pressure (mmHg) 60 mmHg  Ultrafiltration Rate (mL/min) 860 mL/min  Dialysate Flow Rate (mL/min) 800 ml/min  Conductivity: Machine  13.9  HD Safety Checks Performed Yes  Dialysis Fluid Bolus Normal Saline  Bolus Amount (mL) 250 mL  Intra-Hemodialysis Comments Tx initiated

## 2019-10-16 NOTE — BHH Counselor (Signed)
CSW spoke with Todd Brown, [facility] who reports they have contacted [place] and the referral is under review. She states they will begin looking into SNF. In addition, she says they have filed APS report for guardianship, has left messages with DSS staff and waiting to hear from supervisors(Kailee and [place]).

## 2019-10-16 NOTE — Progress Notes (Signed)
[facility]  ROUNDING NOTE   Subjective:   Seen and examined on hemodialysis treatment. Tolerating treatment well.     HEMODIALYSIS FLOWSHEET:  Blood Flow Rate (mL/min): 400 mL/min Arterial Pressure (mmHg): -180 mmHg Venous Pressure (mmHg): 210 mmHg Transmembrane Pressure (mmHg): 60 mmHg Ultrafiltration Rate (mL/min): 860 mL/min Dialysate Flow Rate (mL/min): 800 ml/min Conductivity: Machine : 13.9 Conductivity: Machine : 13.9 Dialysis Fluid Bolus: Normal Saline Bolus Amount (mL): 250 mL    Objective:  Vital signs in last 24 hours:  Temp:  [98.2 F (36.8 C)] 98.2 F (36.8 C) (05/17 0814) Pulse Rate:  [68-78] 68 (05/17 0930) Resp:  [12-18] 12 (05/17 0930) BP: (171-207)/(89-104) 207/104 (05/17 0628) SpO2:  [96 %-99 %] 99 % (05/17 0930)  Weight change:  Filed Weights   10/04/19 2346 10/06/19 0915  Weight: 69.4 kg 69.4 kg    Intake/Output: No intake/output data recorded.   Intake/Output this shift:  No intake/output data recorded.  Physical Exam: General: NAD,   Head: Normocephalic, atraumatic. Moist oral mucosal membranes  Eyes: Anicteric, PERRL  Neck: Supple, trachea midline  Lungs:  Clear to auscultation  Heart: Regular rate and rhythm  Abdomen:  Soft, nontender,   Extremities:  no peripheral edema.  Neurologic: Nonfocal, moving all four extremities  Skin: No lesions  Access: Left AVF    Basic Metabolic Panel: Recent Labs  Lab 10/13/19 0640 10/16/19 0646  NA 133* 135  K 5.1 5.2*  CL 90* 94*  CO2 28 25  GLUCOSE 98 86  BUN 76* 91*  CREATININE 7.71* 9.27*  CALCIUM 9.8 10.1  PHOS 4.9* 4.8*    Liver Function Tests: Recent Labs  Lab 10/16/19 0646  ALBUMIN 3.8   No results for input(s): LIPASE, AMYLASE in the last 168 hours. No results for input(s): AMMONIA in the last 168 hours.  CBC: Recent Labs  Lab 10/13/19 0640 10/16/19 0646  WBC 5.0 5.6  HGB 12.4* 10.9*  HCT 35.8* 31.2*  MCV 95.5 94.5  PLT 227 216    Cardiac  Enzymes: No results for input(s): CKTOTAL, CKMB, CKMBINDEX, TROPONINI in the last 168 hours.  BNP: Invalid input(s): POCBNP  CBG: No results for input(s): GLUCAP in the last 168 hours.  Microbiology: Results for orders placed or performed during the hospital encounter of 10/04/19  Respiratory Panel by RT PCR (Flu A&B, Covid) - Nasopharyngeal Swab     Status: None   Collection Time: 10/04/19  6:26 PM   Specimen: Nasopharyngeal Swab  Result Value Ref Range Status   SARS Coronavirus 2 by RT PCR NEGATIVE NEGATIVE Final    Comment: (NOTE) SARS-CoV-2 target nucleic acids are NOT DETECTED. The SARS-CoV-2 RNA is generally detectable in upper respiratoy specimens during the acute phase of infection. The lowest concentration of SARS-CoV-2 viral copies this assay can detect is 131 copies/mL. A negative result does not preclude SARS-Cov-2 infection and should not be used as the sole basis for treatment or other patient management decisions. A negative result may occur with  improper specimen collection/handling, submission of specimen other than nasopharyngeal swab, presence of viral mutation(s) within the areas targeted by this assay, and inadequate number of viral copies (<131 copies/mL). A negative result must be combined with clinical observations, patient history, and epidemiological information. The expected result is Negative. Fact Sheet for Patients:  PinkCheek.be Fact Sheet for Healthcare Providers:  GravelBags.it This test is not yet ap proved or cleared by the [place] FDA and  has been authorized for detection and/or diagnosis of  SARS-CoV-2 by FDA under an Emergency Use Authorization (EUA). This EUA will remain  in effect (meaning this test can be used) for the duration of the COVID-19 declaration under Section 564(b)(1) of the Act, 21 U.S.C. section 360bbb-3(b)(1), unless the authorization is terminated or revoked  sooner.    Influenza A by PCR NEGATIVE NEGATIVE Final   Influenza B by PCR NEGATIVE NEGATIVE Final    Comment: (NOTE) The Xpert Xpress SARS-CoV-2/FLU/RSV assay is intended as an aid in  the diagnosis of influenza from Nasopharyngeal swab specimens and  should not be used as a sole basis for treatment. Nasal washings and  aspirates are unacceptable for Xpert Xpress SARS-CoV-2/FLU/RSV  testing. Fact Sheet for Patients: PinkCheek.be Fact Sheet for Healthcare Providers: GravelBags.it This test is not yet approved or cleared by the [place] FDA and  has been authorized for detection and/or diagnosis of SARS-CoV-2 by  FDA under an Emergency Use Authorization (EUA). This EUA will remain  in effect (meaning this test can be used) for the duration of the  Covid-19 declaration under Section 564(b)(1) of the Act, 21  U.S.C. section 360bbb-3(b)(1), unless the authorization is  terminated or revoked. Performed at [facility], [address] [place], [place] [address]     Coagulation Studies: No results for input(s): LABPROT, INR in the last 72 hours.  Urinalysis: No results for input(s): COLORURINE, LABSPEC, PHURINE, GLUCOSEU, HGBUR, BILIRUBINUR, KETONESUR, PROTEINUR, UROBILINOGEN, NITRITE, LEUKOCYTESUR in the last 72 hours.  Invalid input(s): APPERANCEUR    Imaging: No results found.   Medications:    . amLODipine  10 mg Oral Daily  . calcium acetate  1,334 mg Oral TID WC  . carbamazepine  1,000 mg Oral BH-q7a  . carvedilol  12.5 mg Oral BID WC  . Chlorhexidine Gluconate Cloth  6 each Topical Q0600  . cholecalciferol  1,000 Units Oral Daily  . cloNIDine  0.3 mg Oral TID  . docusate sodium  100 mg Oral BID  . ferrous sulfate  325 mg Oral Daily  . fluPHENAZine  5 mg Oral BID  . hydrALAZINE  100 mg Oral Q8H  . levothyroxine  100 mcg Oral Q0600  . mometasone-formoterol  2 puff Inhalation BID  .  multivitamin  1 tablet Oral Daily  . nicotine  21 mg Transdermal Daily  . pantoprazole  40 mg Oral Daily  . temazepam  15 mg Oral QHS   acetaminophen, albuterol, heparin, senna, trihexyphenidyl  Assessment/ Plan:  [name] is a 59 y.o. black male with end stage renal disease on hemodialysis, hypertension, schizophrenia, hepatitis C, history of DVT.   [place] Left AVF 73.5kg  1. End Stage Renal Disease: seen and examined on hemodialysis treatment. Continue MWF treatment.   2. Hypertension: elevated. Home regimen of amlodipine, carvedilol, clonidine, hydralazine.   3. Anemia of chronic kidney disease: hemoglobin 10.9. No indication for epo.   4. Secondary Hyperparathyroidism: calcium and phosphorus at goal. Outpatient PTH at goal.  - calcium acetate with meals.    LOS: 12 Zabrina Brotherton 5/17/20211:00 PM

## 2019-10-17 MED ORDER — CARVEDILOL 12.5 MG PO TABS
25.0000 mg | ORAL_TABLET | Freq: Two times a day (BID) | ORAL | Status: DC
  Administered 2019-10-18 – 2019-10-22 (×9): 25 mg via ORAL
  Filled 2019-10-17 (×9): qty 2

## 2019-10-17 MED ORDER — METOPROLOL SUCCINATE ER 25 MG PO TB24
25.0000 mg | ORAL_TABLET | Freq: Every day | ORAL | Status: DC
  Administered 2019-10-17 – 2019-10-19 (×2): 25 mg via ORAL
  Filled 2019-10-17 (×2): qty 1

## 2019-10-17 NOTE — BHH Counselor (Signed)
CSW contacted the following:   Research scientist (physical sciences) Payee Services at [phone] CSW was informed the patient brings in $794 in SSI and $465 [facility] for a total of $1259.  The payee service takes out a $45/month fee.  Ginette Otto, [place], [phone] No answer. CSW left HIPAA compliant voicemail.  [facility], [phone] CSW was informed that they are still looking for placement.  They report no luck with ALF's and have also reached out to group homes.  Information was sent to [employer] on 09/17/2019.  Report that they will not know more on placement until end of week.  Team Lead Delphina, is currently in talks with [place] for guardianship concerns.   Assunta Curtis, MSW, LCSW 10/17/2019 3:30 PM

## 2019-10-17 NOTE — Progress Notes (Signed)
Recreation Therapy Notes  Date: 10/17/2019  Time: 9:30 am   Location: Craft room    Behavioral response: N/A   Intervention Topic: Problem-Solving   Discussion/Intervention: Patient did not attend group.   Clinical Observations/Feedback:  Patient did not attend group.   Kirk Sampley LRT/CTRS         Isa Kohlenberg 10/17/2019 11:06 AM

## 2019-10-17 NOTE — Plan of Care (Signed)
  Problem: Education: Goal: Knowledge of [facility] Education information/materials will improve Outcome: Progressing Goal: Emotional status will improve Outcome: Progressing Goal: Mental status will improve Outcome: Progressing Goal: Verbalization of understanding the information provided will improve Outcome: Progressing   

## 2019-10-17 NOTE — Progress Notes (Signed)
Patient appears with a sad affect stated that he is missing his family and worried about his new living arrangements.Patient is asking for "nerve medicine " more frequently.Denies SI,HI and AVH.Stayed in bed most of the day.Appetite good.Energy level fair.Support and encouragement given.

## 2019-10-17 NOTE — BHH Counselor (Signed)
CSW spoke with Elvera Bicker, dialysis coordinator who is familiar with the pt. She did not know of any housing resources but says she will pass my contact information to Shaniece(pt dialysis social worker). Estill Bamberg called back and says [facility] does not know of any resources. Estill Bamberg suggested referring the pt to a group home and provided the following address of a group home where one of her former pt lived ([address] [place] [address]). CSW contacted the group home([facility]) and was informed they accept male residents.

## 2019-10-17 NOTE — BHH Group Notes (Signed)
LCSW Group Therapy Note  10/17/2019 2:41 PM  Type of Therapy/Topic:  Group Therapy:  Feelings about Diagnosis  Participation Level:  Did Not Attend   Description of Group:   This group will allow patients to explore their thoughts and feelings about diagnoses they have received. Patients will be guided to explore their level of understanding and acceptance of these diagnoses. Facilitator will encourage patients to process their thoughts and feelings about the reactions of others to their diagnosis and will guide patients in identifying ways to discuss their diagnosis with significant others in their lives. This group will be process-oriented, with patients participating in exploration of their own experiences, giving and receiving support, and processing challenge from other group members.   Therapeutic Goals: 1. Patient will demonstrate understanding of diagnosis as evidenced by identifying two or more symptoms of the disorder 2. Patient will be able to express two feelings regarding the diagnosis 3. Patient will demonstrate their ability to communicate their needs through discussion and/or role play  Summary of Patient Progress: X  Therapeutic Modalities:   Cognitive Behavioral Therapy Brief Therapy Feelings Identification   Assunta Curtis, MSW, LCSW 10/17/2019 2:41 PM

## 2019-10-17 NOTE — Progress Notes (Signed)
Patient has been pleasant calm and cooperative. Slept all night. Pleased that the Vistaril he took at bedtime helped him sleep in addition to his other medications. Still regularly using prn inhaler and tylenol. Not as focused on medications. Denies SI, HI and AVH

## 2019-10-17 NOTE — Progress Notes (Signed)
[facility] MD Progress Note  10/17/2019 6:50 PM Todd Brown  MRN:  [id number] Subjective: Follow-up patient with schizophrenia and multiple medical problems.  No new complaints today.  No behavior problems.  No obvious bizarre behavior.  Basically taking care of his hygiene okay.  Blood pressure continues to be high despite multiple medications. Principal Problem: Schizophrenia ([place]) Diagnosis: Principal Problem:   Schizophrenia ([place]) Active Problems:   Essential hypertension, benign   Chronic kidney disease   Hepatitis C   End stage renal disease ([place])   Dialysis patient ([place])  Total Time spent with patient: 30 minutes  Past Psychiatric History: Past history of schizophrenia  Past Medical History:  Past Medical History:  Diagnosis Date  . Anemia   . Diabetes mellitus without complication ([place])   . Dialysis patient [facility])    Mon. -Wed.- Fri  . DVT (deep venous thrombosis) ([place])    cephalic and basolic vein thrombosis  . ESRD (end stage renal disease) ([place])   . Hyperlipidemia   . Malignant hypertension   . Renal artery stenosis ([employer])   . Schizophrenia [facility])     Past Surgical History:  Procedure Laterality Date  . A/V FISTULAGRAM N/A 08/06/2016   Procedure: A/V Fistulagram;  Surgeon: Algernon Huxley, MD;  Location: [place] CV LAB;  Service: Cardiovascular;  Laterality: N/A;  . A/V SHUNT INTERVENTION N/A 08/06/2016   Procedure: A/V Shunt Intervention;  Surgeon: Algernon Huxley, MD;  Location: [place] CV LAB;  Service: Cardiovascular;  Laterality: N/A;  . AV FISTULA PLACEMENT Left 12/26/2014   Procedure: ARTERIOVENOUS (AV) FISTULA CREATION;  Surgeon: Algernon Huxley, MD;  Location: [facility] ORS;  Service: Vascular;  Laterality: Left;  . ESOPHAGOGASTRODUODENOSCOPY (EGD) WITH PROPOFOL N/A 05/06/2017   Procedure: ESOPHAGOGASTRODUODENOSCOPY (EGD) WITH PROPOFOL;  Surgeon: Jonathon Bellows, MD;  Location: [facility] ENDOSCOPY;  Service: Gastroenterology;  Laterality: N/A;  . INSERTION OF DIALYSIS CATHETER  Right   . PERIPHERAL VASCULAR CATHETERIZATION N/A 10/22/2014   Procedure: Dialysis/Perma Catheter Insertion;  Surgeon: Algernon Huxley, MD;  Location: [place] CV LAB;  Service: Cardiovascular;  Laterality: N/A;  . PERIPHERAL VASCULAR CATHETERIZATION N/A 02/28/2015   Procedure: Dialysis/Perma Catheter Removal;  Surgeon: Algernon Huxley, MD;  Location: [place] CV LAB;  Service: Cardiovascular;  Laterality: N/A;  . Repair fx left lower leg     yrs ago (age 7)   Family History:  Family History  Problem Relation Age of Onset  . Diabetes Neg Hx    Family Psychiatric  History: See previous Social History:  Social History   Substance and Sexual Activity  Alcohol Use No     Social History   Substance and Sexual Activity  Drug Use Yes  . Frequency: 1.0 times per week  . Types: Marijuana    Social History   Socioeconomic History  . Marital status: Single    Spouse name: Not on file  . Number of children: Not on file  . Years of education: Not on file  . Highest education level: Not on file  Occupational History  . Occupation: Disability   Tobacco Use  . Smoking status: Current Every Day Smoker    Packs/day: 0.50    Types: Cigarettes  . Smokeless tobacco: Never Used  . Tobacco comment: 10 cigars a day  Substance and Sexual Activity  . Alcohol use: No  . Drug use: Yes    Frequency: 1.0 times per week    Types: Marijuana  . Sexual activity: Not Currently  Other  Topics Concern  . Not on file  Social History Narrative  . Not on file   Social Determinants of Health   Financial Resource Strain:   . Difficulty of Paying Living Expenses:   Food Insecurity:   . Worried About Charity fundraiser in the Last Year:   . Arboriculturist in the Last Year:   Transportation Needs:   . Film/video editor (Medical):   Marland Kitchen Lack of Transportation (Non-Medical):   Physical Activity:   . Days of Exercise per Week:   . Minutes of Exercise per Session:   Stress:   . Feeling of  Stress :   Social Connections:   . Frequency of Communication with Friends and Family:   . Frequency of Social Gatherings with Friends and Family:   . Attends Religious Services:   . Active Member of Clubs or Organizations:   . Attends Archivist Meetings:   Marland Kitchen Marital Status:    Additional Social History:                         Sleep: Fair  Appetite:  Fair  Current Medications: Current Facility-Administered Medications  Medication Dose Route Frequency Provider Last Rate Last Admin  . acetaminophen (TYLENOL) tablet 500 mg  500 mg Oral Q4H PRN Caroline Sauger, NP   500 mg at 10/17/19 1737  . albuterol (VENTOLIN HFA) 108 (90 Base) MCG/ACT inhaler 1-2 puff  1-2 puff Inhalation Q6H PRN Sharma Covert, MD   2 puff at 10/17/19 1252  . amLODipine (NORVASC) tablet 10 mg  10 mg Oral Daily Clapacs, Madie Reno, MD   10 mg at 10/17/19 0735  . calcium acetate (PHOSLO) capsule 1,334 mg  1,334 mg Oral TID WC Caroline Sauger, NP   1,334 mg at 10/17/19 1737  . carbamazepine (TEGRETOL XR) 12 hr tablet 1,000 mg  1,000 mg Oral Chester Holstein, Geni Bers, NP   1,000 mg at 10/17/19 0526  . [START ON 10/18/2019] carvedilol (COREG) tablet 25 mg  25 mg Oral BID WC Clapacs, John T, MD      . Chlorhexidine Gluconate Cloth 2 % PADS 6 each  6 each Topical Q0600 Murlean Iba, MD   6 each at 10/17/19 [phone]  . cholecalciferol (VITAMIN D3) tablet 1,000 Units  1,000 Units Oral Daily Caroline Sauger, NP   1,000 Units at 10/17/19 0735  . cloNIDine (CATAPRES) tablet 0.3 mg  0.3 mg Oral TID Larita Fife, MD   0.3 mg at 10/17/19 1738  . docusate sodium (COLACE) capsule 100 mg  100 mg Oral BID Clapacs, Madie Reno, MD   100 mg at 10/17/19 1738  . ferrous sulfate tablet 325 mg  325 mg Oral Daily Caroline Sauger, NP   325 mg at 10/17/19 0735  . fluPHENAZine (PROLIXIN) tablet 5 mg  5 mg Oral BID Sharma Covert, MD   5 mg at 10/17/19 1739  . heparin injection 1,400 Units  20 Units/kg  Dialysis PRN Murlean Iba, MD      . hydrALAZINE (APRESOLINE) tablet 100 mg  100 mg Oral Q8H Sharma Covert, MD   100 mg at 10/17/19 1337  . hydrOXYzine (ATARAX/VISTARIL) tablet 50 mg  50 mg Oral Q6H PRN Clapacs, Madie Reno, MD   50 mg at 10/17/19 1337  . levothyroxine (SYNTHROID) tablet 100 mcg  100 mcg Oral Q0600 Sharma Covert, MD   100 mcg at 10/17/19 0524  . metoprolol succinate (TOPROL-XL) 24 hr  tablet 25 mg  25 mg Oral Daily Clapacs, John T, MD      . mometasone-formoterol [facility]) 200-5 MCG/ACT inhaler 2 puff  2 puff Inhalation BID Caroline Sauger, NP   2 puff at 10/17/19 0735  . multivitamin (RENA-VIT) tablet 1 tablet  1 tablet Oral Daily Caroline Sauger, NP   1 tablet at 10/17/19 0736  . nicotine (NICODERM CQ - dosed in mg/24 hours) patch 21 mg  21 mg Transdermal Daily Clapacs, Madie Reno, MD   21 mg at 10/17/19 0734  . pantoprazole (PROTONIX) EC tablet 40 mg  40 mg Oral Daily Caroline Sauger, NP   40 mg at 10/17/19 0735  . senna (SENOKOT) tablet 17.2 mg  2 tablet Oral Daily PRN Clapacs, Madie Reno, MD   17.2 mg at 10/16/19 1257  . temazepam (RESTORIL) capsule 15 mg  15 mg Oral QHS Clapacs, Madie Reno, MD   15 mg at 10/16/19 2120  . trihexyphenidyl (ARTANE) tablet 2 mg  2 mg Oral BID Clapacs, Madie Reno, MD   2 mg at 10/17/19 1738    Lab Results:  Results for orders placed or performed during the hospital encounter of 10/04/19 (from the past 48 hour(s))  Renal function panel     Status: Abnormal   Collection Time: 10/16/19  6:46 AM  Result Value Ref Range   Sodium 135 135 - 145 mmol/L   Potassium 5.2 (H) 3.5 - 5.1 mmol/L   Chloride 94 (L) 98 - 111 mmol/L   CO2 25 22 - 32 mmol/L   Glucose, Bld 86 70 - 99 mg/dL    Comment: Glucose reference range applies only to samples taken after fasting for at least 8 hours.   BUN 91 (H) 6 - 20 mg/dL   Creatinine, Ser 9.27 (H) 0.61 - 1.24 mg/dL   Calcium 10.1 8.9 - 10.3 mg/dL   Phosphorus 4.8 (H) 2.5 - 4.6 mg/dL   Albumin 3.8 3.5 - 5.0  g/dL   GFR calc non Af Amer 6 (L) >60 mL/min   GFR calc Af Amer 6 (L) >60 mL/min   Anion gap 16 (H) 5 - 15    Comment: Performed at [facility], [address] [place], [place] [address]  CBC     Status: Abnormal   Collection Time: 10/16/19  6:46 AM  Result Value Ref Range   WBC 5.6 4.0 - 10.5 K/uL   RBC 3.30 (L) 4.22 - 5.81 MIL/uL   Hemoglobin 10.9 (L) 13.0 - 17.0 g/dL   HCT 31.2 (L) 39.0 - 52.0 %   MCV 94.5 80.0 - 100.0 fL   MCH 33.0 26.0 - 34.0 pg   MCHC 34.9 30.0 - 36.0 g/dL   RDW 15.0 11.5 - 15.5 %   Platelets 216 150 - 400 K/uL   nRBC 0.0 0.0 - 0.2 %    Comment: Performed at [facility], [address] [place], [place] [address]    Blood Alcohol level:  Lab Results  Component Value Date   [facility] <10 10/04/2019   ETH <5 41/66/0630    Metabolic Disorder Labs: Lab Results  Component Value Date   HGBA1C 5.0 09/12/2018   MPG 97 09/12/2018   No results found for: PROLACTIN Lab Results  Component Value Date   CHOL 186 10/06/2019   TRIG 75 10/06/2019   HDL 77 10/06/2019   CHOLHDL 2.4 10/06/2019   VLDL 15 10/06/2019   LDLCALC 94 10/06/2019    Physical Findings: AIMS:  , ,  ,  ,  CIWA:    COWS:     Musculoskeletal: Strength & Muscle Tone: within normal limits Gait & Station: normal Patient leans: N/A  Psychiatric Specialty Exam: Physical Exam  Nursing note and vitals reviewed. Constitutional: He appears well-developed and well-nourished.  HENT:  Head: Normocephalic and atraumatic.  Eyes: Pupils are equal, round, and reactive to light. Conjunctivae are normal.  Cardiovascular: Regular rhythm and normal heart sounds.  Respiratory: Effort normal. No respiratory distress.  GI: Soft.  Musculoskeletal:        General: Normal range of motion.     Cervical back: Normal range of motion.  Neurological: He is alert.  Skin: Skin is warm and dry.  Psychiatric: His affect is blunt. His speech is delayed. He is slowed. Cognition and memory are  impaired. He expresses inappropriate judgment. He expresses no homicidal and no suicidal ideation.    Review of Systems  Constitutional: Negative.   HENT: Negative.   Eyes: Negative.   Respiratory: Negative.   Cardiovascular: Negative.   Gastrointestinal: Negative.   Musculoskeletal: Negative.   Skin: Negative.   Neurological: Negative.   Psychiatric/Behavioral: Negative.     Blood pressure (!) 198/96, pulse 89, temperature 98.6 F (37 C), temperature source Oral, resp. rate 17, height 6\' 3"  (1.905 m), weight 69.4 kg, SpO2 95 %.Body mass index is 19.12 kg/m.  General Appearance: Disheveled  Eye Contact:  Minimal  Speech:  Garbled  Volume:  Decreased  Mood:  Dysphoric  Affect:  Congruent  Thought Process:  Disorganized  Orientation:  Full (Time, Place, and Person)  Thought Content:  Rumination  Suicidal Thoughts:  No  Homicidal Thoughts:  No  Memory:  Immediate;   Fair Recent;   Poor Remote;   Poor  Judgement:  Impaired  Insight:  Shallow  Psychomotor Activity:  Decreased  Concentration:  Concentration: Poor  Recall:  [place] of Knowledge:  Fair  Language:  Fair  Akathisia:  No  Handed:  Right  AIMS (if indicated):     Assets:  Desire for Improvement  ADL's:  Intact  Cognition:  Impaired,  Mild  Sleep:  Number of Hours: 6.25     Treatment Plan Summary: Daily contact with patient to assess and evaluate symptoms and progress in treatment, Medication management and Plan No change to psychiatric plan.  Increased dose of Coreg.  Add starting dose of metoprolol extended release.  Treatment team still working on placement  Alethia Berthold, MD 10/17/2019, 6:50 PM

## 2019-10-17 NOTE — BHH Counselor (Signed)
CSW left confidential vm for Todd Brown [phone], dialysis coordinator, awaiting response. CSW called to inquire about housing resources for dialysis patients.

## 2019-10-17 NOTE — Plan of Care (Signed)
Denies SI,HI and AVH.Appropriate with staff & peers.

## 2019-10-18 MED ORDER — DIPHENHYDRAMINE HCL 25 MG PO CAPS
25.0000 mg | ORAL_CAPSULE | Freq: Once | ORAL | Status: AC
  Administered 2019-10-18: 25 mg via ORAL

## 2019-10-18 NOTE — BHH Counselor (Signed)
CSW contacted the following group homes.   [facility], [phone] Requested that information be faxed to [place], [phone] CSW left HIPAA compliant voicemail requesting a return phone call.  [facility], [phone] Requested that fax be sent to [phone].  [facility] Blight, [phone] No male beds available.   [facility], [phone],  No male beds available.  Constance Holster, [phone] CSW left HIPAA compliant voicemail requesting a return phone call.   Chrystie Nose, [place], [phone] Requested that [facility] be faxed to [phone].  Sharl Ma, [phone] CSW left HIPAA compliant voicemail requesting a return phone call.   Terri Skains, [phone] CSW left HIPAA compliant voicemail requesting a return phone call.   Monico Blitz, [place], [phone] Declined due to dialysis need.   Assunta Curtis, MSW, LCSW 10/18/2019 11:36 AM

## 2019-10-18 NOTE — Plan of Care (Signed)
The patient is cooperative.  Problem: Education: Goal: Emotional status will improve Outcome: Progressing Goal: Mental status will improve Outcome: Progressing

## 2019-10-18 NOTE — Progress Notes (Addendum)
Pt arrived to unit with tech via [place] no c/os no issues voiced pt stable for HD tx...UFG 3.5L AVF +/+

## 2019-10-18 NOTE — Progress Notes (Signed)
Pt stable tolerated HD tx well no isses AVF +/+ UFG [id number] achieved vitals stable

## 2019-10-18 NOTE — BHH Counselor (Signed)
CSW faxed the referrals to possible bed placements identified in previous notes. CSW received confirmation that the fax was successful.   Assunta Curtis, MSW, LCSW 10/18/2019 1:09 PM

## 2019-10-18 NOTE — Progress Notes (Signed)
[facility]  ROUNDING NOTE   Subjective:   Seen and examined on hemodialysis treatment. Tolerating treatment well.   Blood pressure elevated. Patient admits to drinking more fluid than usual yesterday.     HEMODIALYSIS FLOWSHEET:  Blood Flow Rate (mL/min): 400 mL/min Arterial Pressure (mmHg): -180 mmHg Venous Pressure (mmHg): 210 mmHg Transmembrane Pressure (mmHg): 60 mmHg Ultrafiltration Rate (mL/min): 860 mL/min Dialysate Flow Rate (mL/min): 800 ml/min Conductivity: Machine : 13.9 Conductivity: Machine : 13.9 Dialysis Fluid Bolus: Normal Saline Bolus Amount (mL): 250 mL    Objective:  Vital signs in last 24 hours:  Temp:  [97.6 F (36.4 C)] 97.6 F (36.4 C) (05/19 0616) Pulse Rate:  [73-89] 73 (05/19 0616) Resp:  [17] 17 (05/19 0616) BP: (156-219)/(89-120) 219/120 (05/19 0616) SpO2:  [95 %] 95 % (05/19 0616)  Weight change:  Filed Weights   10/04/19 2346 10/06/19 0915  Weight: 69.4 kg 69.4 kg    Intake/Output: No intake/output data recorded.   Intake/Output this shift:  No intake/output data recorded.  Physical Exam: General: NAD,   Head: Normocephalic, atraumatic. Moist oral mucosal membranes  Eyes: Anicteric, PERRL  Neck: Supple, trachea midline  Lungs:  Clear to auscultation  Heart: Regular rate and rhythm  Abdomen:  Soft, nontender,   Extremities: + peripheral edema.  Neurologic: Nonfocal, moving all four extremities  Skin: No lesions  Access: Left AVF    Basic Metabolic Panel: Recent Labs  Lab 10/13/19 0640 10/16/19 0646  NA 133* 135  K 5.1 5.2*  CL 90* 94*  CO2 28 25  GLUCOSE 98 86  BUN 76* 91*  CREATININE 7.71* 9.27*  CALCIUM 9.8 10.1  PHOS 4.9* 4.8*    Liver Function Tests: Recent Labs  Lab 10/16/19 0646  ALBUMIN 3.8   No results for input(s): LIPASE, AMYLASE in the last 168 hours. No results for input(s): AMMONIA in the last 168 hours.  CBC: Recent Labs  Lab 10/13/19 0640 10/16/19 0646  WBC 5.0 5.6   HGB 12.4* 10.9*  HCT 35.8* 31.2*  MCV 95.5 94.5  PLT 227 216    Cardiac Enzymes: No results for input(s): CKTOTAL, CKMB, CKMBINDEX, TROPONINI in the last 168 hours.  BNP: Invalid input(s): POCBNP  CBG: No results for input(s): GLUCAP in the last 168 hours.  Microbiology: Results for orders placed or performed during the hospital encounter of 10/04/19  Respiratory Panel by RT PCR (Flu A&B, Covid) - Nasopharyngeal Swab     Status: None   Collection Time: 10/04/19  6:26 PM   Specimen: Nasopharyngeal Swab  Result Value Ref Range Status   SARS Coronavirus 2 by RT PCR NEGATIVE NEGATIVE Final    Comment: (NOTE) SARS-CoV-2 target nucleic acids are NOT DETECTED. The SARS-CoV-2 RNA is generally detectable in upper respiratoy specimens during the acute phase of infection. The lowest concentration of SARS-CoV-2 viral copies this assay can detect is 131 copies/mL. A negative result does not preclude SARS-Cov-2 infection and should not be used as the sole basis for treatment or other patient management decisions. A negative result may occur with  improper specimen collection/handling, submission of specimen other than nasopharyngeal swab, presence of viral mutation(s) within the areas targeted by this assay, and inadequate number of viral copies (<131 copies/mL). A negative result must be combined with clinical observations, patient history, and epidemiological information. The expected result is Negative. Fact Sheet for Patients:  PinkCheek.be Fact Sheet for Healthcare Providers:  GravelBags.it This test is not yet ap proved or cleared by the [place]  States FDA and  has been authorized for detection and/or diagnosis of SARS-CoV-2 by FDA under an Emergency Use Authorization (EUA). This EUA will remain  in effect (meaning this test can be used) for the duration of the COVID-19 declaration under Section 564(b)(1) of the Act, 21  U.S.C. section 360bbb-3(b)(1), unless the authorization is terminated or revoked sooner.    Influenza A by PCR NEGATIVE NEGATIVE Final   Influenza B by PCR NEGATIVE NEGATIVE Final    Comment: (NOTE) The Xpert Xpress SARS-CoV-2/FLU/RSV assay is intended as an aid in  the diagnosis of influenza from Nasopharyngeal swab specimens and  should not be used as a sole basis for treatment. Nasal washings and  aspirates are unacceptable for Xpert Xpress SARS-CoV-2/FLU/RSV  testing. Fact Sheet for Patients: PinkCheek.be Fact Sheet for Healthcare Providers: GravelBags.it This test is not yet approved or cleared by the [place] FDA and  has been authorized for detection and/or diagnosis of SARS-CoV-2 by  FDA under an Emergency Use Authorization (EUA). This EUA will remain  in effect (meaning this test can be used) for the duration of the  Covid-19 declaration under Section 564(b)(1) of the Act, 21  U.S.C. section 360bbb-3(b)(1), unless the authorization is  terminated or revoked. Performed at [facility], [address] [place], [place] [address]     Coagulation Studies: No results for input(s): LABPROT, INR in the last 72 hours.  Urinalysis: No results for input(s): COLORURINE, LABSPEC, PHURINE, GLUCOSEU, HGBUR, BILIRUBINUR, KETONESUR, PROTEINUR, UROBILINOGEN, NITRITE, LEUKOCYTESUR in the last 72 hours.  Invalid input(s): APPERANCEUR    Imaging: No results found.   Medications:    . amLODipine  10 mg Oral Daily  . calcium acetate  1,334 mg Oral TID WC  . carbamazepine  1,000 mg Oral BH-q7a  . carvedilol  25 mg Oral BID WC  . Chlorhexidine Gluconate Cloth  6 each Topical Q0600  . cholecalciferol  1,000 Units Oral Daily  . cloNIDine  0.3 mg Oral TID  . docusate sodium  100 mg Oral BID  . ferrous sulfate  325 mg Oral Daily  . fluPHENAZine  5 mg Oral BID  . hydrALAZINE  100 mg Oral Q8H  . levothyroxine   100 mcg Oral Q0600  . metoprolol succinate  25 mg Oral Daily  . mometasone-formoterol  2 puff Inhalation BID  . multivitamin  1 tablet Oral Daily  . nicotine  21 mg Transdermal Daily  . pantoprazole  40 mg Oral Daily  . temazepam  15 mg Oral QHS  . trihexyphenidyl  2 mg Oral BID   acetaminophen, albuterol, heparin, hydrOXYzine, senna  Assessment/ Plan:  [name] is a 59 y.o. black male with end stage renal disease on hemodialysis, hypertension, schizophrenia, hepatitis C, history of DVT.   [place] Left AVF 73.5kg  1. End Stage Renal Disease: seen and examined on hemodialysis treatment. Continue MWF treatment.   2. Hypertension: elevated. Home regimen of amlodipine, carvedilol, clonidine, hydralazine.  - Consider IV hydralazine PRN if further control needed.   3. Anemia of chronic kidney disease: hemoglobin 10.9. No indication for epo.   4. Secondary Hyperparathyroidism: calcium and phosphorus at goal. Outpatient PTH at goal.  - calcium acetate with meals.    LOS: [place] 5/19/202110:33 AM

## 2019-10-18 NOTE — NC FL2 (Signed)
[place] LEVEL OF CARE SCREENING TOOL     IDENTIFICATION  Patient Name: Todd Brown Birthdate: 1960/09/29 Sex: male Admission Date (Current Location): 10/04/2019  [place] and [place] Number:  Engineering geologist and Address:  [facility], [address] [place], [place] [address]      Provider Number: [id number]  Attending Physician Name and Address:  Gonzella Lex, MD  Relative Name and Phone Number:       Current Level of Care: Hospital Recommended Level of Care: [place], Other (Comment)(Group Home) Prior Approval Number:    Date Approved/Denied:   PASRR Number:    Discharge Plan: Other (Comment)(Group Home, ALF)    Current Diagnoses: Patient Active Problem List   Diagnosis Date Noted  . Fluid overload 10/08/2018  . Acute pulmonary edema ([place]) 09/12/2018  . Scrotal edema 05/10/2017  . Symptomatic anemia 04/30/2017  . Anticoagulated on Coumadin 09/14/2016  . Cardiac arrest ([place]) 08/03/2016  . Sepsis ([place]) 05/26/2016  . Dialysis patient ([place]) 04/15/2016  . End stage renal disease ([place]) 02/12/2016  . Hyperkalemia 05/20/2015  . Hepatitis C 09/26/2013  . Schizophrenia ([place]) 03/23/2013  . Essential hypertension, benign 03/23/2013  . Chronic kidney disease 03/23/2013    Orientation RESPIRATION BLADDER Height & Weight     Self  Other (Comment)(Possible swallowing issues, being further assessed.  Several inhalers.) Continent Weight: (uanble to weigh pt) Height:  6\' 3"  (190.5 cm)  BEHAVIORAL SYMPTOMS/MOOD NEUROLOGICAL BOWEL NUTRITION STATUS  Other (Comment)(ACT Team pursued admission with reports of physical aggression, however, since being on the unit the patient has had no signs of verbal or physical aggression.)   Continent Diet  AMBULATORY STATUS COMMUNICATION OF NEEDS Skin   Independent Verbally(Patient is difficult to understand when he speaks.) Other  (Comment)(Fistula in left arm)                       Personal Care Assistance Level of Assistance              Functional Limitations Info  Speech     Speech Info: Impaired(Patient is difficult to understand when he speaks.)    [place]  Blood pressure(Swallow/Speech consult being pursued; Dialysis, History of Cardiac Arrest 2018; Thyroid Disease)                    Contractures Contractures Info: Not present    Additional Factors Info  Code Status, Allergies Code Status Info: Full Allergies Info: Chlorpromazine           Current Medications (10/18/2019):  This is the current hospital active medication list Current Facility-Administered Medications  Medication Dose Route Frequency Provider Last Rate Last Admin  . acetaminophen (TYLENOL) tablet 500 mg  500 mg Oral Q4H PRN Caroline Sauger, NP   500 mg at 10/17/19 2100  . albuterol (VENTOLIN HFA) 108 (90 Base) MCG/ACT inhaler 1-2 puff  1-2 puff Inhalation Q6H PRN Sharma Covert, MD   2 puff at 10/17/19 1252  . amLODipine (NORVASC) tablet 10 mg  10 mg Oral Daily Clapacs, Madie Reno, MD   Stopped at 10/18/19 [phone]  . calcium acetate (PHOSLO) capsule 1,334 mg  1,334 mg Oral TID WC Caroline Sauger, NP   1,334 mg at 10/18/19 0731  . carbamazepine (TEGRETOL XR) 12 hr tablet 1,000 mg  1,000 mg Oral Chester Holstein, Geni Bers, NP   1,000 mg at 10/18/19 9937  .  carvedilol (COREG) tablet 25 mg  25 mg Oral BID WC Clapacs, John T, MD   25 mg at 10/18/19 0730  . Chlorhexidine Gluconate Cloth 2 % PADS 6 each  6 each Topical Q0600 Murlean Iba, MD   6 each at 10/17/19 [phone]  . cholecalciferol (VITAMIN D3) tablet 1,000 Units  1,000 Units Oral Daily Caroline Sauger, NP   1,000 Units at 10/18/19 0730  . cloNIDine (CATAPRES) tablet 0.3 mg  0.3 mg Oral TID Larita Fife, MD   Stopped at 10/18/19 6195  . docusate sodium (COLACE) capsule 100 mg  100 mg Oral BID Clapacs, John T, MD   100 mg at 10/18/19  0730  . ferrous sulfate tablet 325 mg  325 mg Oral Daily Caroline Sauger, NP   325 mg at 10/18/19 0730  . fluPHENAZine (PROLIXIN) tablet 5 mg  5 mg Oral BID Sharma Covert, MD   5 mg at 10/18/19 0731  . heparin injection 1,400 Units  20 Units/kg Dialysis PRN Murlean Iba, MD      . hydrALAZINE (APRESOLINE) tablet 100 mg  100 mg Oral Q8H Sharma Covert, MD   100 mg at 10/18/19 0932  . hydrOXYzine (ATARAX/VISTARIL) tablet 50 mg  50 mg Oral Q6H PRN Clapacs, Madie Reno, MD   50 mg at 10/17/19 2100  . levothyroxine (SYNTHROID) tablet 100 mcg  100 mcg Oral Q0600 Sharma Covert, MD   100 mcg at 10/18/19 0640  . metoprolol succinate (TOPROL-XL) 24 hr tablet 25 mg  25 mg Oral Daily Clapacs, Madie Reno, MD   Stopped at 10/18/19 [phone]  . mometasone-formoterol (DULERA) 200-5 MCG/ACT inhaler 2 puff  2 puff Inhalation BID Caroline Sauger, NP   2 puff at 10/18/19 0732  . multivitamin (RENA-VIT) tablet 1 tablet  1 tablet Oral Daily Caroline Sauger, NP   1 tablet at 10/18/19 0732  . nicotine (NICODERM CQ - dosed in mg/24 hours) patch 21 mg  21 mg Transdermal Daily Clapacs, Madie Reno, MD   21 mg at 10/18/19 0730  . pantoprazole (PROTONIX) EC tablet 40 mg  40 mg Oral Daily Caroline Sauger, NP   40 mg at 10/18/19 0730  . senna (SENOKOT) tablet 17.2 mg  2 tablet Oral Daily PRN Clapacs, Madie Reno, MD   17.2 mg at 10/16/19 1257  . temazepam (RESTORIL) capsule 15 mg  15 mg Oral QHS Clapacs, Madie Reno, MD   15 mg at 10/17/19 2101  . trihexyphenidyl (ARTANE) tablet 2 mg  2 mg Oral BID Clapacs, Madie Reno, MD   2 mg at 10/18/19 4580     Discharge Medications: Please see discharge summary for a list of discharge medications.  Relevant Imaging Results:  Relevant Lab Results:   Additional Information    Rozann Lesches, LCSW

## 2019-10-18 NOTE — BHH Counselor (Signed)
CSW contacted the following to locate possible bed placement:  [facility], [phone] -Ms. [place] says they do not accept dialysis patients; they do not have the staff to address that need.  [facility], [phone] -Declined due to pt physical health, states they do not have the staff to address the need.  [facility], [phone] -Bed available. Request H&P, FL2. When received, will review, and make decision today or tomorrow.  [facility] [place], [phone] -CSW left vm, awaiting response  [place], [phone] -No male bed available  [facility], [phone] -No bed available, reports may have an opening on 5/24

## 2019-10-18 NOTE — BHH Counselor (Signed)
CSW called [facility], [phone].    CSW provided the pt's income amount and answered previous questions on the patient's dialysis.    Diane reports that she will follow up with [facility] in [place] and [place].  She provided the following contact information for Karen Chafe with [employer] who can be of assistance as well; [phone] or [phone].   She reports that she will pass this information along to [place] and have him reach out.   Placement continues to be a concern.  Assunta Curtis, MSW, LCSW 10/18/2019 10:56 AM

## 2019-10-18 NOTE — Progress Notes (Signed)
[facility] MD Progress Note  10/18/2019 3:21 PM Todd Brown  MRN:  [id number] Subjective: Follow-up for this patient with schizophrenia.  Patient seen chart reviewed.  He has no specific new complaints.  Spends most of his time isolated or in his room.  When I came to interview him he did become animated.  Very difficult to understand what he is complaining about but appears to want more "medicine for my nerves".  Nevertheless he is not agitated and has not been asking for the as needed medicine he already has.  Blood pressure continues to be elevated despite multiple medications.  Patient continues to have difficulty swallowing with a lot of coughing after eating Principal Problem: Schizophrenia ([place]) Diagnosis: Principal Problem:   Schizophrenia ([place]) Active Problems:   Essential hypertension, benign   Chronic kidney disease   Hepatitis C   End stage renal disease ([place])   Dialysis patient ([place])  Total Time spent with patient: 30 minutes  Past Psychiatric History: Past history of chronic schizophrenia  Past Medical History:  Past Medical History:  Diagnosis Date  . Anemia   . Diabetes mellitus without complication ([place])   . Dialysis patient [facility])    Mon. -Wed.- Fri  . 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 ESRD (end stage renal disease) ([place])   . Hyperlipidemia   . Malignant hypertension   . Renal artery stenosis ([place])   . Schizophrenia [facility])     Past Surgical History:  Procedure Laterality Date  . A/V FISTULAGRAM N/A 08/06/2016   Procedure: A/V Fistulagram;  Surgeon: Algernon Huxley, MD;  Location: [place] CV LAB;  Service: Cardiovascular;  Laterality: N/A;  . A/V SHUNT INTERVENTION N/A 08/06/2016   Procedure: A/V Shunt Intervention;  Surgeon: Algernon Huxley, MD;  Location: [place] CV LAB;  Service: Cardiovascular;  Laterality: N/A;  . AV FISTULA PLACEMENT Left 12/26/2014   Procedure: ARTERIOVENOUS (AV) FISTULA CREATION;  Surgeon: Algernon Huxley, MD;  Location: [facility] ORS;  Service: Vascular;  Laterality: Left;  . ESOPHAGOGASTRODUODENOSCOPY (EGD) WITH PROPOFOL N/A 05/06/2017   Procedure: ESOPHAGOGASTRODUODENOSCOPY (EGD) WITH PROPOFOL;  Surgeon: Jonathon Bellows, MD;  Location: [facility] ENDOSCOPY;  Service: Gastroenterology;  Laterality: N/A;  . INSERTION OF DIALYSIS CATHETER Right   . PERIPHERAL VASCULAR CATHETERIZATION N/A 10/22/2014   Procedure: Dialysis/Perma Catheter Insertion;  Surgeon: Algernon Huxley, MD;  Location: [place] CV LAB;  Service: Cardiovascular;  Laterality: N/A;  . PERIPHERAL VASCULAR CATHETERIZATION N/A 02/28/2015   Procedure: Dialysis/Perma Catheter Removal;  Surgeon: Algernon Huxley, MD;  Location: [place] CV LAB;  Service: Cardiovascular;  Laterality: N/A;  . Repair fx left lower leg     yrs ago (age 27)   Family History:  Family History  Problem Relation Age of Onset  . Diabetes Neg Hx    Family Psychiatric  History: See previous Social History:  Social History   Substance and Sexual Activity  Alcohol Use No     Social History   Substance and Sexual Activity  Drug Use Yes  . Frequency: 1.0 times per week  . Types: Marijuana    Social History   Socioeconomic History  . Marital status: Single    Spouse name: Not on file  . Number of children: Not on file  . Years of education: Not on file  . Highest education level: Not on file  Occupational History  . Occupation: Disability   Tobacco Use  . Smoking status: Current  Every Day Smoker    Packs/day: 0.50    Types: Cigarettes  . Smokeless tobacco: Never Used  . Tobacco comment: 10 cigars a day  Substance and Sexual Activity  . Alcohol use: No  . Drug use: Yes    Frequency: 1.0 times per week    Types: Marijuana  . Sexual activity: Not Currently  Other Topics Concern  . Not on file  Social History Narrative  . Not on file   Social Determinants of Health   Financial Resource Strain:   . Difficulty of Paying Living Expenses:   Food  Insecurity:   . Worried About Charity fundraiser in the Last Year:   . Arboriculturist in the Last Year:   Transportation Needs:   . Film/video editor (Medical):   Marland Kitchen Lack of Transportation (Non-Medical):   Physical Activity:   . Days of Exercise per Week:   . Minutes of Exercise per Session:   Stress:   . Feeling of Stress :   Social Connections:   . Frequency of Communication with Friends and Family:   . Frequency of Social Gatherings with Friends and Family:   . Attends Religious Services:   . Active Member of Clubs or Organizations:   . Attends Archivist Meetings:   Marland Kitchen Marital Status:    Additional Social History:                         Sleep: Fair  Appetite:  Fair  Current Medications: Current Facility-Administered Medications  Medication Dose Route Frequency Provider Last Rate Last Admin  . acetaminophen (TYLENOL) tablet 500 mg  500 mg Oral Q4H PRN Caroline Sauger, NP   500 mg at 10/17/19 2100  . albuterol (VENTOLIN HFA) 108 (90 Base) MCG/ACT inhaler 1-2 puff  1-2 puff Inhalation Q6H PRN Sharma Covert, MD   2 puff at 10/17/19 1252  . amLODipine (NORVASC) tablet 10 mg  10 mg Oral Daily Aricka Goldberger, Madie Reno, MD   Stopped at 10/18/19 [phone]  . calcium acetate (PHOSLO) capsule 1,334 mg  1,334 mg Oral TID WC Caroline Sauger, NP   1,334 mg at 10/18/19 0731  . carbamazepine (TEGRETOL XR) 12 hr tablet 1,000 mg  1,000 mg Oral Chester Holstein, Geni Bers, NP   1,000 mg at 10/18/19 1660  . carvedilol (COREG) tablet 25 mg  25 mg Oral BID WC Alieu Finnigan T, MD   25 mg at 10/18/19 0730  . Chlorhexidine Gluconate Cloth 2 % PADS 6 each  6 each Topical Q0600 Murlean Iba, MD   6 each at 10/17/19 [phone]  . cholecalciferol (VITAMIN D3) tablet 1,000 Units  1,000 Units Oral Daily Caroline Sauger, NP   1,000 Units at 10/18/19 0730  . cloNIDine (CATAPRES) tablet 0.3 mg  0.3 mg Oral TID Larita Fife, MD   Stopped at 10/18/19 6010  . docusate sodium (COLACE)  capsule 100 mg  100 mg Oral BID Toma Erichsen T, MD   100 mg at 10/18/19 0730  . ferrous sulfate tablet 325 mg  325 mg Oral Daily Caroline Sauger, NP   325 mg at 10/18/19 0730  . fluPHENAZine (PROLIXIN) tablet 5 mg  5 mg Oral BID Sharma Covert, MD   5 mg at 10/18/19 0731  . heparin injection 1,400 Units  20 Units/kg Dialysis PRN Murlean Iba, MD      . hydrALAZINE (APRESOLINE) tablet 100 mg  100 mg Oral Q8H Clary, Cordie Grice, MD  100 mg at 10/18/19 1423  . hydrOXYzine (ATARAX/VISTARIL) tablet 50 mg  50 mg Oral Q6H PRN Isolde Skaff, Madie Reno, MD   50 mg at 10/17/19 2100  . levothyroxine (SYNTHROID) tablet 100 mcg  100 mcg Oral Q0600 Sharma Covert, MD   100 mcg at 10/18/19 0640  . metoprolol succinate (TOPROL-XL) 24 hr tablet 25 mg  25 mg Oral Daily Iviona Hole, Madie Reno, MD   Stopped at 10/18/19 [phone]  . mometasone-formoterol (DULERA) 200-5 MCG/ACT inhaler 2 puff  2 puff Inhalation BID Caroline Sauger, NP   2 puff at 10/18/19 0732  . multivitamin (RENA-VIT) tablet 1 tablet  1 tablet Oral Daily Caroline Sauger, NP   1 tablet at 10/18/19 0732  . nicotine (NICODERM CQ - dosed in mg/24 hours) patch 21 mg  21 mg Transdermal Daily Oather Muilenburg, Madie Reno, MD   21 mg at 10/18/19 0730  . pantoprazole (PROTONIX) EC tablet 40 mg  40 mg Oral Daily Caroline Sauger, NP   40 mg at 10/18/19 0730  . senna (SENOKOT) tablet 17.2 mg  2 tablet Oral Daily PRN Ronnell Clinger, Madie Reno, MD   17.2 mg at 10/16/19 1257  . temazepam (RESTORIL) capsule 15 mg  15 mg Oral QHS Orpah Hausner, Madie Reno, MD   15 mg at 10/17/19 2101  . trihexyphenidyl (ARTANE) tablet 2 mg  2 mg Oral BID Timika Muench, Madie Reno, MD   2 mg at 10/18/19 0998    Lab Results: No results found for this or any previous visit (from the past 48 hour(s)).  Blood Alcohol level:  Lab Results  Component Value Date   ETH <10 10/04/2019   ETH <5 33/82/5053    Metabolic Disorder Labs: Lab Results  Component Value Date   HGBA1C 5.0 09/12/2018   MPG 97 09/12/2018   No  results found for: PROLACTIN Lab Results  Component Value Date   CHOL 186 10/06/2019   TRIG 75 10/06/2019   HDL 77 10/06/2019   CHOLHDL 2.4 10/06/2019   VLDL 15 10/06/2019   LDLCALC 94 10/06/2019    Physical Findings: AIMS:  , ,  ,  ,    CIWA:    COWS:     Musculoskeletal: Strength & Muscle Tone: within normal limits Gait & Station: normal Patient leans: N/A  Psychiatric Specialty Exam: Physical Exam  Nursing note and vitals reviewed. Constitutional: He appears well-developed and well-nourished.  HENT:  Head: Normocephalic and atraumatic.  Eyes: Pupils are equal, round, and reactive to light. Conjunctivae are normal.  Cardiovascular: Regular rhythm and normal heart sounds.  Respiratory: Effort normal. No respiratory distress.  GI: Soft.  Musculoskeletal:        General: Normal range of motion.     Cervical back: Normal range of motion.  Neurological: He is alert.  Skin: Skin is warm and dry.  Psychiatric: His affect is blunt. His speech is tangential. He is not agitated and not aggressive. Thought content is delusional. Cognition and memory are impaired. He expresses inappropriate judgment.    Review of Systems  Constitutional: Negative.   HENT: Negative.   Eyes: Negative.   Respiratory: Negative.   Cardiovascular: Negative.   Gastrointestinal: Negative.   Musculoskeletal: Negative.   Skin: Negative.   Neurological: Negative.   Psychiatric/Behavioral: Negative.     Blood pressure (!) 196/93, pulse 71, temperature 97.8 F (36.6 C), temperature source Oral, resp. rate 16, height 6\' 3"  (1.905 m), weight 69.4 kg, SpO2 100 %.Body mass index is 19.12 kg/m.  General Appearance: Disheveled  Eye Contact:  Minimal  Speech:  Garbled  Volume:  Normal  Mood:  Euthymic  Affect:  Constricted  Thought Process:  Disorganized  Orientation:  Full (Time, Place, and Person)  Thought Content:  Illogical  Suicidal Thoughts:  No  Homicidal Thoughts:  No  Memory:  Immediate;    Fair Recent;   Fair Remote;   Fair  Judgement:  Fair  Insight:  Fair  Psychomotor Activity:  Normal  Concentration:  Concentration: Fair  Recall:  [employer] of Knowledge:  Fair  Language:  Fair  Akathisia:  No  Handed:  Right  AIMS (if indicated):     Assets:  Desire for Improvement  ADL's:  Intact  Cognition:  WNL  Sleep:  Number of Hours: 4.15     Treatment Plan Summary: Daily contact with patient to assess and evaluate symptoms and progress in treatment, Medication management and Plan I have requested a speech and swallowing consult because of everyone's observation that he is having trouble swallowing.  We may need to change his diet.  Patient is being seen by social services today.  Overall we are still mainly focusing on trying to find placement.  Alethia Berthold, MD 10/18/2019, 3:21 PM

## 2019-10-18 NOTE — Progress Notes (Signed)
Received call from [place] yesterday about difficulties of finding housing for this patient, unfortunately myself nor the SW at patient's dialysis center have any known resources either. Please keep me informed in the progress of housing placement, patient may need to be set up at another clinic if moving out of [facility].   Elvera Bicker Dialysis Coordinator [phone]

## 2019-10-18 NOTE — BHH Group Notes (Signed)
Emotional Regulation 10/18/2019 9:30AM/1PM  Type of Therapy/Topic:  Group Therapy:  Emotion Regulation  Participation Level:  Did Not Attend   Description of Group:   The purpose of this group is to assist patients in learning to regulate negative emotions and experience positive emotions. Patients will be guided to discuss ways in which they have been vulnerable to their negative emotions. These vulnerabilities will be juxtaposed with experiences of positive emotions or situations, and patients will be challenged to use positive emotions to combat negative ones. Special emphasis will be placed on coping with negative emotions in conflict situations, and patients will process healthy conflict resolution skills.  Therapeutic Goals: 1. Patient will identify two positive emotions or experiences to reflect on in order to balance out negative emotions 2. Patient will label two or more emotions that they find the most difficult to experience 3. Patient will demonstrate positive conflict resolution skills through discussion and/or role plays  Summary of Patient Progress:       Therapeutic Modalities:   Cognitive Behavioral Therapy Feelings Identification Dialectical Behavioral Therapy   Yvette Rack, LCSW 10/18/2019 1:59 PM

## 2019-10-18 NOTE — Progress Notes (Signed)
F - Find placement.  D - The patient was cooperative and behaviorally appropriate.  He ate a small portion of his breakfast, accepted medications, and then was transported to dialysis.  Medications were accepted as ordered and no symptoms of psychosis noted.    A - The patient was provided medications as ordered.  R - Continue with care.

## 2019-10-19 MED ORDER — TUBERCULIN PPD 5 UNIT/0.1ML ID SOLN
5.0000 [IU] | Freq: Once | INTRADERMAL | Status: AC
  Administered 2019-10-20: 5 [IU] via INTRADERMAL
  Filled 2019-10-19: qty 0.1

## 2019-10-19 MED ORDER — METOPROLOL SUCCINATE ER 25 MG PO TB24: 50.0000 mg | ORAL_TABLET | Freq: Every day | ORAL | Status: DC

## 2019-10-19 MED ORDER — PANTOPRAZOLE SODIUM 40 MG PO TBEC
40.0000 mg | DELAYED_RELEASE_TABLET | Freq: Two times a day (BID) | ORAL | Status: DC
  Administered 2019-10-19 – 2019-10-22 (×7): 40 mg via ORAL
  Filled 2019-10-19 (×7): qty 1

## 2019-10-19 NOTE — BHH Counselor (Signed)
CSW spoke with Allene Pyo [phone] [facility] group home [place], [place]) who reports the pt has been accepted and can plan to move in on Tuesday, May 25th. Ms. Oswaldo Milian states the pt will need a TB test and Covid test prior to him moving in. She also reports Medicaid transportation will need to be scheduled so the pt can continue receiving dialysis treatment. CSW attempted to get in contact with [facility] to relay this information but were unavailable and left message with receptionist, awaiting call back.

## 2019-10-19 NOTE — Progress Notes (Signed)
F - Find placement  D - The patient was cooperative and behaviorally appropriate.  He accepted medications as ordered and his mood was stable.  No episodes of irritation or anger noted.  Lesly attended groups, watched television, and remained visible in the milieu.  He remained safe on his feet.  Hypertension continues.  No symtoms of psychosis noted.       A - The patient was provided medications as ordered with descriptions of each dose and the reasons for talking.  Extra time provided to ensure clarity.  Meals changed due to swallow study results.  No significant changes to note.     R - Continue with care and monitor health issues.

## 2019-10-19 NOTE — Plan of Care (Signed)
The patient is calm and cooperative.  Problem: Education: Goal: Emotional status will improve Outcome: Progressing Goal: Mental status will improve Outcome: Progressing Goal: Verbalization of understanding the information provided will improve Outcome: Progressing

## 2019-10-19 NOTE — BHH Group Notes (Signed)
LCSW Group Therapy Note  10/19/2019 1:00 PM  Type of Therapy/Topic:  Group Therapy:  Balance in Life  Participation Level:  Did Not Attend  Description of Group:    This group will address the concept of balance and how it feels and looks when one is unbalanced. Patients will be encouraged to process areas in their lives that are out of balance and identify reasons for remaining unbalanced. Facilitators will guide patients in utilizing problem-solving interventions to address and correct the stressor making their life unbalanced. Understanding and applying boundaries will be explored and addressed for obtaining and maintaining a balanced life. Patients will be encouraged to explore ways to assertively make their unbalanced needs known to significant others in their lives, using other group members and facilitator for support and feedback.  Therapeutic Goals: 1. Patient will identify two or more emotions or situations they have that consume much of in their lives. 2. Patient will identify signs/triggers that life has become out of balance:  3. Patient will identify two ways to set boundaries in order to achieve balance in their lives:  4. Patient will demonstrate ability to communicate their needs through discussion and/or role plays  Summary of Patient Progress: X  Therapeutic Modalities:   Cognitive Behavioral Therapy Solution-Focused Therapy Assertiveness Training  Assunta Curtis MSW, LCSW 10/19/2019 12:50 PM

## 2019-10-19 NOTE — Tx Team (Signed)
Interdisciplinary Treatment and Diagnostic Plan Update  10/19/2019 Time of Session: [place] MRN: [id number]  Principal Diagnosis: Schizophrenia [facility])  Secondary Diagnoses: Principal Problem:   Schizophrenia ([place]) Active Problems:   Essential hypertension, benign   Chronic kidney disease   Hepatitis C   End stage renal disease ([place])   Dialysis patient ([place])   Current Medications:  Current Facility-Administered Medications  Medication Dose Route Frequency Provider Last Rate Last Admin  . acetaminophen (TYLENOL) tablet 500 mg  500 mg Oral Q4H PRN Caroline Sauger, NP   500 mg at 10/19/19 2440  . albuterol (VENTOLIN HFA) 108 (90 Base) MCG/ACT inhaler 1-2 puff  1-2 puff Inhalation Q6H PRN Sharma Covert, MD   2 puff at 10/18/19 2054  . amLODipine (NORVASC) tablet 10 mg  10 mg Oral Daily Clapacs, Madie Reno, MD   10 mg at 10/19/19 0816  . calcium acetate (PHOSLO) capsule 1,334 mg  1,334 mg Oral TID WC Caroline Sauger, NP   1,334 mg at 10/19/19 0816  . carbamazepine (TEGRETOL XR) 12 hr tablet 1,000 mg  1,000 mg Oral Chester Holstein, Geni Bers, NP   1,000 mg at 10/19/19 0657  . carvedilol (COREG) tablet 25 mg  25 mg Oral BID WC Clapacs, Madie Reno, MD   25 mg at 10/19/19 0816  . Chlorhexidine Gluconate Cloth 2 % PADS 6 each  6 each Topical Q0600 Murlean Iba, MD   6 each at 10/17/19 [phone]  . cholecalciferol (VITAMIN D3) tablet 1,000 Units  1,000 Units Oral Daily Caroline Sauger, NP   1,000 Units at 10/19/19 0816  . cloNIDine (CATAPRES) tablet 0.3 mg  0.3 mg Oral TID Larita Fife, MD   0.3 mg at 10/19/19 0816  . docusate sodium (COLACE) capsule 100 mg  100 mg Oral BID Clapacs, Madie Reno, MD   100 mg at 10/19/19 0815  . ferrous sulfate tablet 325 mg  325 mg Oral Daily Caroline Sauger, NP   325 mg at 10/19/19 0815  . fluPHENAZine (PROLIXIN) tablet 5 mg  5 mg Oral BID Sharma Covert, MD   5 mg at 10/19/19 0815  . heparin injection 1,400 Units  20 Units/kg Dialysis  PRN Murlean Iba, MD      . hydrALAZINE (APRESOLINE) tablet 100 mg  100 mg Oral Q8H Sharma Covert, MD   100 mg at 10/19/19 0655  . hydrOXYzine (ATARAX/VISTARIL) tablet 50 mg  50 mg Oral Q6H PRN Clapacs, Madie Reno, MD   50 mg at 10/19/19 0353  . levothyroxine (SYNTHROID) tablet 100 mcg  100 mcg Oral Q0600 Sharma Covert, MD   100 mcg at 10/19/19 [phone]  . metoprolol succinate (TOPROL-XL) 24 hr tablet 25 mg  25 mg Oral Daily Clapacs, Madie Reno, MD   25 mg at 10/19/19 0814  . mometasone-formoterol (DULERA) 200-5 MCG/ACT inhaler 2 puff  2 puff Inhalation BID Caroline Sauger, NP   2 puff at 10/19/19 0813  . multivitamin (RENA-VIT) tablet 1 tablet  1 tablet Oral Daily Caroline Sauger, NP   1 tablet at 10/19/19 0813  . nicotine (NICODERM CQ - dosed in mg/24 hours) patch 21 mg  21 mg Transdermal Daily Clapacs, Madie Reno, MD   21 mg at 10/19/19 6440  . pantoprazole (PROTONIX) EC tablet 40 mg  40 mg Oral BID Clapacs, John T, MD      . senna (SENOKOT) tablet 17.2 mg  2 tablet Oral Daily PRN Clapacs, Madie Reno, MD   17.2 mg at 10/16/19 1257  .  temazepam (RESTORIL) capsule 15 mg  15 mg Oral QHS Clapacs, Madie Reno, MD   15 mg at 10/18/19 2104  . trihexyphenidyl (ARTANE) tablet 2 mg  2 mg Oral BID Clapacs, Madie Reno, MD   2 mg at 10/19/19 2725   PTA Medications: Medications Prior to Admission  Medication Sig Dispense Refill Last Dose  . acetaminophen (TYLENOL) 500 MG tablet Take 500 mg by mouth every 4 (four) hours as needed for mild pain or moderate pain.      Marland Kitchen albuterol (PROAIR HFA) 108 (90 Base) MCG/ACT inhaler Inhale 1-2 puffs into the lungs every 4 (four) hours as needed for wheezing or shortness of breath.      Marland Kitchen amLODipine (NORVASC) 10 MG tablet Take 1 tablet (10 mg total) by mouth daily.     . calcium acetate (PHOSLO) 667 MG capsule Take 2 capsules (1,334 mg total) by mouth 3 (three) times daily with meals. 60 capsule 1   . carbamazepine (TEGRETOL XR) 200 MG 12 hr tablet Take 1,000 mg by mouth every  morning.     . celecoxib (CELEBREX) 200 MG capsule Take 200 mg by mouth daily as needed for mild pain.     . cholecalciferol (VITAMIN D) 1000 UNITS tablet Take 1,000 Units by mouth daily.     . cloNIDine (CATAPRES) 0.3 MG tablet Take 0.3 mg by mouth 3 (three) times daily.     . ferrous sulfate 325 (65 FE) MG tablet Take 1 tablet (325 mg total) by mouth daily. 30 tablet 3   . fluPHENAZine (PROLIXIN) 2.5 MG tablet Take 2.5 mg by mouth 2 (two) times daily.      . Fluticasone-Salmeterol (ADVAIR) 500-50 MCG/DOSE AEPB Inhale 1 puff into the lungs 2 (two) times daily.     . hydrALAZINE (APRESOLINE) 100 MG tablet Take 1 tablet (100 mg total) by mouth 2 (two) times daily.     . minoxidil (LONITEN) 10 MG tablet Take 10 mg by mouth 2 (two) times daily.     . multivitamin (RENA-VIT) TABS tablet Take 1 tablet by mouth daily.     Marland Kitchen omeprazole (PRILOSEC) 40 MG capsule Take 1 capsule (40 mg total) by mouth daily. 30 capsule 1   . oxyCODONE (ROXICODONE) 5 MG immediate release tablet Take 1 tablet (5 mg total) by mouth every 8 (eight) hours as needed. 10 tablet 0   . trihexyphenidyl (ARTANE) 2 MG tablet Take 2 mg by mouth 2 (two) times daily.       Patient Stressors: Health problems Medication change or noncompliance  Patient Strengths: Capable of independent living General fund of knowledge  Treatment Modalities: Medication Management, Group therapy, Case management,  1 to 1 session with clinician, Psychoeducation, Recreational therapy.   Physician Treatment Plan for Primary Diagnosis: Schizophrenia ([place]) Long Term Goal(s): Improvement in symptoms so as ready for discharge Improvement in symptoms so as ready for discharge   Short Term Goals: Ability to verbalize feelings will improve Ability to demonstrate self-control will improve Ability to identify and develop effective coping behaviors will improve Ability to maintain clinical measurements within normal limits will improve Compliance with  prescribed medications will improve  Medication Management: Evaluate patient's response, side effects, and tolerance of medication regimen.  Therapeutic Interventions: 1 to 1 sessions, Unit Group sessions and Medication administration.  Evaluation of Outcomes: Progressing  Physician Treatment Plan for Secondary Diagnosis: Principal Problem:   Schizophrenia ([place]) Active Problems:   Essential hypertension, benign   Chronic kidney disease   Hepatitis C  End stage renal disease ([place])   Dialysis patient [facility])  Long Term Goal(s): Improvement in symptoms so as ready for discharge Improvement in symptoms so as ready for discharge   Short Term Goals: Ability to verbalize feelings will improve Ability to demonstrate self-control will improve Ability to identify and develop effective coping behaviors will improve Ability to maintain clinical measurements within normal limits will improve Compliance with prescribed medications will improve     Medication Management: Evaluate patient's response, side effects, and tolerance of medication regimen.  Therapeutic Interventions: 1 to 1 sessions, Unit Group sessions and Medication administration.  Evaluation of Outcomes: Progressing   RN Treatment Plan for Primary Diagnosis: Schizophrenia ([place]) Long Term Goal(s): Knowledge of disease and therapeutic regimen to maintain health will improve  Short Term Goals: Ability to identify and develop effective coping behaviors will improve and Compliance with prescribed medications will improve  Medication Management: RN will administer medications as ordered by provider, will assess and evaluate patient's response and provide education to patient for prescribed medication. RN will report any adverse and/or side effects to prescribing provider.  Therapeutic Interventions: 1 on 1 counseling sessions, Psychoeducation, Medication administration, Evaluate responses to treatment, Monitor vital signs and CBGs as  ordered, Perform/monitor CIWA, COWS, AIMS and Fall Risk screenings as ordered, Perform wound care treatments as ordered.  Evaluation of Outcomes: Progressing   LCSW Treatment Plan for Primary Diagnosis: Schizophrenia ([place]) Long Term Goal(s): Safe transition to appropriate next level of care at discharge, Engage patient in therapeutic group addressing interpersonal concerns.  Short Term Goals: Engage patient in aftercare planning with referrals and resources  Therapeutic Interventions: Assess for all discharge needs, 1 to 1 time with Social worker, Explore available resources and support systems, Assess for adequacy in community support network, Educate family and significant other(s) on suicide prevention, Complete Psychosocial Assessment, Interpersonal group therapy.  Evaluation of Outcomes: Progressing   Progress in Treatment: Attending groups: No. Participating in groups: No. Taking medication as prescribed: Yes. Toleration medication: Yes. Family/Significant other contact made: Yes, individual(s) contacted:  pt declined Patient understands diagnosis: No. Discussing patient identified problems/goals with staff: Yes. Medical problems stabilized or resolved: Yes. Denies suicidal/homicidal ideation: Yes Issues/concerns per patient self-inventory: No. Other: NA  New problem(s) identified: No, Describe:  None reported  New Short Term/Long Term Goal(s):Attend outpatient treatment, develop and implement healthy coping methods, take medication as prescribed.  Patient Goals:  "I dont have a goal"  Discharge Plan or Barriers: Pt speaks gibberish, difficult to understand what he is saying. However, pt has an ACT Team at [facility]. DC plan TBD. Update 10/10/19- Pt's ACT Team reports they will contact DSS to pursue guardianship. Team reports concerns about pt not being able to properly care for himself and live alone. Team states they are working to secure placement at [facility], assisted  living facility. Update 10/19/19- CSW team working with [facility] to locate group home or ALF for the patient. Pt has been psychiatrically cleared by physician and is waiting on placement.  Reason for Continuation of Hospitalization: Medical Issues Medication stabilization  Estimated Length of Stay: TBD   Recreational Therapy: Patient Stressors: N/A Patient Goal: Patient will engage in groups without prompting or encouragement from LRT x3 group sessions within 5 recreation therapy group sessions  Attendees: Patient:  10/14/2019   Physician: Alethia Berthold 10/14/2019   Nursing: Alvis Lemmings 10/14/2019   RN Care Manager: 10/14/2019   Social Worker: Sanjuana Kava Assunta Curtis 10/14/2019   Recreational Therapist:  10/14/2019   Other:  10/14/2019   Other:  10/14/2019   Other: 10/14/2019        Scribe for Treatment Team: Yvette Rack, LCSW 10/19/2019 11:40 AM

## 2019-10-19 NOTE — [facility] Counselor (Signed)
CSW informed by Estill Bamberg Morris(dialysis coordinator) transportation will need to be coordinated through the pt medicaid worker and she provided the contact information to [employer] [phone]. CSW contacted [facility] and spoke with [place] who states the DSS case worker will need to make the referral for transportation pick up. [place] suggested [employer] [phone]. CSW called and left voicemail for Patty Bristow(medicaid transportation coordinator), awaiting call back.

## 2019-10-19 NOTE — Plan of Care (Signed)
  Problem: Education: Goal: Knowledge of [facility] Education information/materials will improve Outcome: Progressing Goal: Emotional status will improve Outcome: Progressing Goal: Mental status will improve Outcome: Progressing Goal: Verbalization of understanding the information provided will improve Outcome: Progressing   Problem: Activity: Goal: Interest or engagement in activities will improve Outcome: Progressing Goal: Sleeping patterns will improve Outcome: Progressing   Problem: Coping: Goal: Ability to verbalize frustrations and anger appropriately will improve Outcome: Progressing Goal: Ability to demonstrate self-control will improve Outcome: Progressing   Problem: Health Behavior/Discharge Planning: Goal: Identification of resources available to assist in meeting health care needs will improve Outcome: Progressing Goal: Compliance with treatment plan for underlying cause of condition will improve Outcome: Progressing   Problem: Physical Regulation: Goal: Ability to maintain clinical measurements within normal limits will improve Outcome: Progressing   Problem: Safety: Goal: Periods of time without injury will increase Outcome: Progressing   Problem: Activity: Goal: Will identify at least one activity in which they can participate Outcome: Progressing   Problem: Coping: Goal: Ability to identify and develop effective coping behavior will improve Outcome: Progressing Goal: Ability to interact with others will improve Outcome: Progressing Goal: Demonstration of participation in decision-making regarding own care will improve Outcome: Progressing Goal: Ability to use eye contact when communicating with others will improve Outcome: Progressing   Problem: Health Behavior/Discharge Planning: Goal: Identification of resources available to assist in meeting health care needs will improve Outcome: Progressing   Problem: Self-Concept: Goal: Will  verbalize positive feelings about self Outcome: Progressing

## 2019-10-19 NOTE — Progress Notes (Signed)
The patient pleasant and cooperative. Received Tylenol for generalized body pain. Tolerated with out incident. Continues with complains of difficulty breathing requesting to use inhaler, but informed it was too soon. Patient informed to contact staff with concerns and remains safe with 15 minute safety checks.

## 2019-10-19 NOTE — Progress Notes (Signed)
[facility] MD Progress Note  10/19/2019 2:54 PM Todd Brown  MRN:  [id number] Subjective: Follow-up for this patient with schizophrenia who is on dialysis and has extreme hypertension.  Patient has no new complaints.  He was seen by speech therapy today.  No swallowing phase disorder was detected but there is good reason to think that some of his eating problems may come from ongoing esophageal and stomach discomfort and lack of teeth.  Some suggestions were made about change in diet.  Patient himself does not have any specific complaints now.  Denies hallucinations.  No dangerous behavior.  We have learned that a plan is in place for probable discharge in 5 days.  His blood pressure continues to be in normal mostly high despite being on 5 antihypertensives at this point. Principal Problem: Schizophrenia ([place]) Diagnosis: Principal Problem:   Schizophrenia ([place]) Active Problems:   Essential hypertension, benign   Chronic kidney disease   Hepatitis C   End stage renal disease ([place])   Dialysis patient ([place])  Total Time spent with patient: 30 minutes  Past Psychiatric History: Past history of chronic schizophrenia  Past Medical History:  Past Medical History:  Diagnosis Date  . Anemia   . Diabetes mellitus without complication ([place])   . Dialysis patient [facility])    Mon. -Wed.- Fri  . DVT (deep venous thrombosis) ([place])    cephalic and basolic vein thrombosis  . ESRD (end stage renal disease) ([place])   . Hyperlipidemia   . Malignant hypertension   . Renal artery stenosis ([place])   . Schizophrenia [facility])     Past Surgical History:  Procedure Laterality Date  . A/V FISTULAGRAM N/A 08/06/2016   Procedure: A/V Fistulagram;  Surgeon: Algernon Huxley, MD;  Location: [place] CV LAB;  Service: Cardiovascular;  Laterality: N/A;  . A/V SHUNT INTERVENTION N/A 08/06/2016   Procedure: A/V Shunt Intervention;  Surgeon: Algernon Huxley, MD;  Location: [place] CV LAB;  Service: Cardiovascular;  Laterality:  N/A;  . AV FISTULA PLACEMENT Left 12/26/2014   Procedure: ARTERIOVENOUS (AV) FISTULA CREATION;  Surgeon: Algernon Huxley, MD;  Location: [facility] ORS;  Service: Vascular;  Laterality: Left;  . ESOPHAGOGASTRODUODENOSCOPY (EGD) WITH PROPOFOL N/A 05/06/2017   Procedure: ESOPHAGOGASTRODUODENOSCOPY (EGD) WITH PROPOFOL;  Surgeon: Jonathon Bellows, MD;  Location: [facility] ENDOSCOPY;  Service: Gastroenterology;  Laterality: N/A;  . INSERTION OF DIALYSIS CATHETER Right   . PERIPHERAL VASCULAR CATHETERIZATION N/A 10/22/2014   Procedure: Dialysis/Perma Catheter Insertion;  Surgeon: Algernon Huxley, MD;  Location: [place] CV LAB;  Service: Cardiovascular;  Laterality: N/A;  . PERIPHERAL VASCULAR CATHETERIZATION N/A 02/28/2015   Procedure: Dialysis/Perma Catheter Removal;  Surgeon: Algernon Huxley, MD;  Location: [place] CV LAB;  Service: Cardiovascular;  Laterality: N/A;  . Repair fx left lower leg     yrs ago (age 25)   Family History:  Family History  Problem Relation Age of Onset  . Diabetes Neg Hx    Family Psychiatric  History: See previous Social History:  Social History   Substance and Sexual Activity  Alcohol Use No     Social History   Substance and Sexual Activity  Drug Use Yes  . Frequency: 1.0 times per week  . Types: Marijuana    Social History   Socioeconomic History  . Marital status: Single    Spouse name: Not on file  . Number of children: Not on file  . Years of education: Not on file  . Highest education level:  Not on file  Occupational History  . Occupation: Disability   Tobacco Use  . Smoking status: Current Every Day Smoker    Packs/day: 0.50    Types: Cigarettes  . Smokeless tobacco: Never Used  . Tobacco comment: 10 cigars a day  Substance and Sexual Activity  . Alcohol use: No  . Drug use: Yes    Frequency: 1.0 times per week    Types: Marijuana  . Sexual activity: Not Currently  Other Topics Concern  . Not on file  Social History Narrative  . Not on file    Social Determinants of Health   Financial Resource Strain:   . Difficulty of Paying Living Expenses:   Food Insecurity:   . Worried About Charity fundraiser in the Last Year:   . Arboriculturist in the Last Year:   Transportation Needs:   . Film/video editor (Medical):   Marland Kitchen Lack of Transportation (Non-Medical):   Physical Activity:   . Days of Exercise per Week:   . Minutes of Exercise per Session:   Stress:   . Feeling of Stress :   Social Connections:   . Frequency of Communication with Friends and Family:   . Frequency of Social Gatherings with Friends and Family:   . Attends Religious Services:   . Active Member of Clubs or Organizations:   . Attends Archivist Meetings:   Marland Kitchen Marital Status:    Additional Social History:                         Sleep: Fair  Appetite:  Fair  Current Medications: Current Facility-Administered Medications  Medication Dose Route Frequency Provider Last Rate Last Admin  . acetaminophen (TYLENOL) tablet 500 mg  500 mg Oral Q4H PRN Caroline Sauger, NP   500 mg at 10/19/19 1439  . albuterol (VENTOLIN HFA) 108 (90 Base) MCG/ACT inhaler 1-2 puff  1-2 puff Inhalation Q6H PRN Sharma Covert, MD   2 puff at 10/18/19 2054  . amLODipine (NORVASC) tablet 10 mg  10 mg Oral Daily Madysun Thall, Madie Reno, MD   10 mg at 10/19/19 0816  . calcium acetate (PHOSLO) capsule 1,334 mg  1,334 mg Oral TID WC Caroline Sauger, NP   1,334 mg at 10/19/19 1217  . carbamazepine (TEGRETOL XR) 12 hr tablet 1,000 mg  1,000 mg Oral Chester Holstein, Geni Bers, NP   1,000 mg at 10/19/19 0657  . carvedilol (COREG) tablet 25 mg  25 mg Oral BID WC Nahia Nissan, Madie Reno, MD   25 mg at 10/19/19 0816  . Chlorhexidine Gluconate Cloth 2 % PADS 6 each  6 each Topical Q0600 Murlean Iba, MD   6 each at 10/17/19 [phone]  . cholecalciferol (VITAMIN D3) tablet 1,000 Units  1,000 Units Oral Daily Caroline Sauger, NP   1,000 Units at 10/19/19 0816  . cloNIDine  (CATAPRES) tablet 0.3 mg  0.3 mg Oral TID Larita Fife, MD   0.3 mg at 10/19/19 1217  . docusate sodium (COLACE) capsule 100 mg  100 mg Oral BID Oceana Walthall, Madie Reno, MD   100 mg at 10/19/19 0815  . ferrous sulfate tablet 325 mg  325 mg Oral Daily Caroline Sauger, NP   325 mg at 10/19/19 0815  . fluPHENAZine (PROLIXIN) tablet 5 mg  5 mg Oral BID Sharma Covert, MD   5 mg at 10/19/19 0815  . heparin injection 1,400 Units  20 Units/kg Dialysis PRN Murlean Iba,  MD      . hydrALAZINE (APRESOLINE) tablet 100 mg  100 mg Oral Q8H Sharma Covert, MD   100 mg at 10/19/19 1439  . hydrOXYzine (ATARAX/VISTARIL) tablet 50 mg  50 mg Oral Q6H PRN Wilmore Holsomback, Madie Reno, MD   50 mg at 10/19/19 0353  . levothyroxine (SYNTHROID) tablet 100 mcg  100 mcg Oral Q0600 Sharma Covert, MD   100 mcg at 10/19/19 [phone]  . metoprolol succinate (TOPROL-XL) 24 hr tablet 25 mg  25 mg Oral Daily Shahla Betsill, Madie Reno, MD   25 mg at 10/19/19 0814  . mometasone-formoterol (DULERA) 200-5 MCG/ACT inhaler 2 puff  2 puff Inhalation BID Caroline Sauger, NP   2 puff at 10/19/19 0813  . multivitamin (RENA-VIT) tablet 1 tablet  1 tablet Oral Daily Caroline Sauger, NP   1 tablet at 10/19/19 0813  . nicotine (NICODERM CQ - dosed in mg/24 hours) patch 21 mg  21 mg Transdermal Daily Mackenzi Krogh, Madie Reno, MD   21 mg at 10/19/19 4193  . pantoprazole (PROTONIX) EC tablet 40 mg  40 mg Oral BID Alvetta Hidrogo T, MD      . senna (SENOKOT) tablet 17.2 mg  2 tablet Oral Daily PRN Corydon Schweiss, Madie Reno, MD   17.2 mg at 10/16/19 1257  . temazepam (RESTORIL) capsule 15 mg  15 mg Oral QHS Rozalynn Buege, Madie Reno, MD   15 mg at 10/18/19 2104  . trihexyphenidyl (ARTANE) tablet 2 mg  2 mg Oral BID Trammell Bowden, Madie Reno, MD   2 mg at 10/19/19 7902    Lab Results: No results found for this or any previous visit (from the past 48 hour(s)).  Blood Alcohol level:  Lab Results  Component Value Date   ETH <10 10/04/2019   ETH <5 40/97/3532    Metabolic Disorder Labs: Lab  Results  Component Value Date   HGBA1C 5.0 09/12/2018   MPG 97 09/12/2018   No results found for: PROLACTIN Lab Results  Component Value Date   CHOL 186 10/06/2019   TRIG 75 10/06/2019   HDL 77 10/06/2019   CHOLHDL 2.4 10/06/2019   VLDL 15 10/06/2019   LDLCALC 94 10/06/2019    Physical Findings: AIMS:  , ,  ,  ,    CIWA:    COWS:     Musculoskeletal: Strength & Muscle Tone: within normal limits Gait & Station: normal Patient leans: N/A  Psychiatric Specialty Exam: Physical Exam  Nursing note and vitals reviewed. Constitutional: He appears well-developed and well-nourished.  HENT:  Head: Normocephalic and atraumatic.  Eyes: Pupils are equal, round, and reactive to light. Conjunctivae are normal.  Cardiovascular: Regular rhythm and normal heart sounds.  Respiratory: Effort normal. No respiratory distress.  GI: Soft.  Musculoskeletal:        General: Normal range of motion.     Cervical back: Normal range of motion.  Neurological: He is alert.  Skin: Skin is warm and dry.  Psychiatric: His affect is blunt. His speech is delayed. He is slowed. Cognition and memory are impaired. He expresses inappropriate judgment. He expresses no homicidal and no suicidal ideation.    Review of Systems  Constitutional: Negative.   HENT: Negative.   Eyes: Negative.   Respiratory: Negative.   Cardiovascular: Negative.   Gastrointestinal: Negative.   Musculoskeletal: Negative.   Skin: Negative.   Neurological: Negative.   Psychiatric/Behavioral: Negative.     Blood pressure (!) 208/130, pulse 78, temperature 98.8 F (37.1 C), temperature source Oral, resp. rate  14, height 6\' 3"  (1.905 m), weight 69.4 kg, SpO2 100 %.Body mass index is 19.12 kg/m.  General Appearance: Casual  Eye Contact:  Fair  Speech:  Garbled  Volume:  Decreased  Mood:  Euthymic  Affect:  Constricted  Thought Process:  Coherent  Orientation:  Full (Time, Place, and Person)  Thought Content:  Illogical,  Rumination and Tangential  Suicidal Thoughts:  No  Homicidal Thoughts:  No  Memory:  Immediate;   Fair Recent;   Fair Remote;   Poor  Judgement:  Impaired  Insight:  Shallow  Psychomotor Activity:  Decreased  Concentration:  Concentration: Poor  Recall:  [employer] of Knowledge:  Fair  Language:  Fair  Akathisia:  No  Handed:  Right  AIMS (if indicated):     Assets:  Desire for Improvement Resilience  ADL's:  Impaired  Cognition:  Impaired,  Mild  Sleep:  Number of Hours: 3     Treatment Plan Summary: Daily contact with patient to assess and evaluate symptoms and progress in treatment, Medication management and Plan Increased dose of proton pump inhibitor to twice a day.  Diet is already renal diet with recommendation to assist with cutting up food.  Review of medications for high blood pressure.  Unclear what else we can do except continue to try going up on beta-blocker.  Supportive counseling and encouragement to patient.  He is agreeable to the discharge plan.  We will go ahead and check a tuberculosis test and prepare for a COVID test  Alethia Berthold, MD 10/19/2019, 2:54 PM

## 2019-10-19 NOTE — BHH Counselor (Signed)
CSW spoke with [place] at [facility].  CSW explained that she was attempting to coordinate the transportation for the patient from his group home placement in [place] to his dialysis center in [place].  She reports that the patient's Medicaid worker, Skip Estimable, [phone] will need to complete a transportation assessment.  Once the transportation assessment has been completed the transportation can be arranged.    CSW called Skip Estimable [phone] and left HIPAA compliant voicemail.  Assunta Curtis, MSW, LCSW 10/19/2019 3:25 PM

## 2019-10-19 NOTE — BHH Counselor (Signed)
CSW followed up with the following:  Barrington Ellison, [phone] She reports that she has given the paperwork to the group home to review.  Asked that we call back in one hour.  Chrystie Nose, [facility], [phone] Declined the pt.  Seward Carol, [phone] No answer.  CSW left HIPAA compliant voicemail.  Assunta Curtis, MSW, LCSW 10/19/2019 2:19 PM

## 2019-10-19 NOTE — Evaluation (Signed)
Clinical/Bedside Swallow Evaluation Patient Details  Name: Todd Brown MRN: [id number] Date of Birth: 1961-02-10  Today's Date: 10/19/2019 Time: SLP Start Time (ACUTE ONLY): 0845 SLP Stop Time (ACUTE ONLY): 0945 SLP Time Calculation (min) (ACUTE ONLY): 60 min  Past Medical History:  Past Medical History:  Diagnosis Date  . Anemia   . Diabetes mellitus without complication ([place])   . Dialysis patient [facility])    Mon. -Wed.- Fri  . DVT (deep venous thrombosis) ([place])    cephalic and basolic vein thrombosis  . ESRD (end stage renal disease) ([place])   . Hyperlipidemia   . Malignant hypertension   . Renal artery stenosis ([place])   . Schizophrenia [facility])    Past Surgical History:  Past Surgical History:  Procedure Laterality Date  . A/V FISTULAGRAM N/A 08/06/2016   Procedure: A/V Fistulagram;  Surgeon: Algernon Huxley, MD;  Location: [place] CV LAB;  Service: Cardiovascular;  Laterality: N/A;  . A/V SHUNT INTERVENTION N/A 08/06/2016   Procedure: A/V Shunt Intervention;  Surgeon: Algernon Huxley, MD;  Location: [place] CV LAB;  Service: Cardiovascular;  Laterality: N/A;  . AV FISTULA PLACEMENT Left 12/26/2014   Procedure: ARTERIOVENOUS (AV) FISTULA CREATION;  Surgeon: Algernon Huxley, MD;  Location: [facility] ORS;  Service: Vascular;  Laterality: Left;  . ESOPHAGOGASTRODUODENOSCOPY (EGD) WITH PROPOFOL N/A 05/06/2017   Procedure: ESOPHAGOGASTRODUODENOSCOPY (EGD) WITH PROPOFOL;  Surgeon: Jonathon Bellows, MD;  Location: [facility] ENDOSCOPY;  Service: Gastroenterology;  Laterality: N/A;  . INSERTION OF DIALYSIS CATHETER Right   . PERIPHERAL VASCULAR CATHETERIZATION N/A 10/22/2014   Procedure: Dialysis/Perma Catheter Insertion;  Surgeon: Algernon Huxley, MD;  Location: [place] CV LAB;  Service: Cardiovascular;  Laterality: N/A;  . PERIPHERAL VASCULAR CATHETERIZATION N/A 02/28/2015   Procedure: Dialysis/Perma Catheter Removal;  Surgeon: Algernon Huxley, MD;  Location: [place] CV LAB;  Service: Cardiovascular;   Laterality: N/A;  . Repair fx left lower leg     yrs ago (age 27)   HPI:  Pt is a 59 year old man with a history of schizophrenia, CKD, Hep C, HTN, DM, ERSD, DVT. He admitted to the behavioral health unit at [facility] b/c he was brought in for psychiatric evaluation for increasing violent behavior visual and auditory hallucinations. He had not had HD for ~2 weeks. In 05/2017 he had an EGD done which revealed: "Patchy moderate inflammation characterized by congestion (edema) and erythema was found in the gastric body; and Patchy moderate inflammation characterized by congestion (edema) and erythema was found in the gastric body".    Assessment / Plan / Recommendation Clinical Impression  Pt appears to present w/ adequate oropharyngeal phase swallowing function w/ No gross oropharyngeal phase dysphagia/deficits noted. Pt exhibited No overt clinical s/s of aspiration w/ trials given; thin liquids, purees and softened solids. Pt is Edentulous at Baseline(does not wear Dentures) and required min Time for full gumming/mashing of the solids. Oral clearing achieved w/ all trials post Timely A-P transfer and organized bolus management. Of note, an EGD done in 05/2017 revealed: "Patchy moderate inflammation characterized by congestion (edema) and erythema was found in the gastric body; and Patchy moderate inflammation characterized by congestion (edema) and erythema was found in the gastric body". Currently, pt endorsed "full feelings sometimes" putting his hand to his lower abdomen but he "tries not to over do it" and eats a little less at the moment. If he has ANY Esophageal dysmotility, this can increase risk for Retrograde food/liquid activity, Regurgitation, and definitely the Cough response  d/t the irritation of the Esophagus and/or Reflux (in absence of pharyngeal phase dysphagia). He is Edentulous which impacts his ability to sufficiently masticate solids/meats which means he swallows solids more  "whole" which can increase Esophageal dysmotility. OM exam revealed mild-mod lingual weakness of the mid-tongue body which impacted articulation of speech - pt stated this was baseline for him and that there was "no change in my speech"; also noted Dysfluency(stuttering). Lingual/labial ROM and symmetry were WFL; lingual protrusion appropriate. Recommend a more chopped meats/Mech Soft consistency diet w/ thin liquids for ease of mastication/gumming; Reflux precautions. General aspiration precautions. Maybe f/u w/ GI for an updated assessment of Esophageal motility, recommendations. He was unsure if he took a "stomach pill" now like he did "before".  MD/NSG updated. ST services can be reconsult if any new changes while admitted.  SLP Visit Diagnosis: Dysphagia, oral phase (R13.11)(Edentulous status baseline)    Aspiration Risk  (reduced following general precautions)    Diet Recommendation  Mech Soft diet (meats cut; moistened solids), Thin liquids. General aspiration and REFLUX precautions  Medication Administration: Whole meds with liquid(or in Puree if needed for ease)    Other  Recommendations Recommended Consults: Consider GI evaluation;Consider esophageal assessment(Dietician f/u) Oral Care Recommendations: Oral care BID;Oral care before and after PO;Patient independent with oral care Other Recommendations: (n/a)   Follow up Recommendations None      Frequency and Duration (n/a)  (n/a)       Prognosis Prognosis for Safe Diet Advancement: Good      Swallow Study   General Date of Onset: 10/04/19 HPI: Pt is a 59 year old man with a history of schizophrenia, CKD, Hep C, HTN, DM, ERSD, DVT. He admitted to the behavioral health unit at [facility] b/c he was brought in for psychiatric evaluation for increasing violent behavior visual and auditory hallucinations. He had not had HD for ~2 weeks. In 05/2017 he had an EGD done which revealed: "Patchy moderate inflammation characterized  by congestion (edema) and erythema was found in the gastric body; and Patchy moderate inflammation characterized by congestion (edema) and erythema was found in the gastric body".  Type of Study: Bedside Swallow Evaluation Previous Swallow Assessment: none reported Diet Prior to this Study: Regular;Thin liquids Temperature Spikes Noted: No(wbc wnl on 10/16/2019) Respiratory Status: Room air History of Recent Intubation: No Behavior/Cognition: Alert;Cooperative;Pleasant mood;Distractible;Requires cueing Oral Cavity Assessment: Within Functional Limits Oral Care Completed by SLP: Yes Oral Cavity - Dentition: Edentulous(does not wear dentures) Vision: Functional for self-feeding Self-Feeding Abilities: Able to feed self Patient Positioning: Upright in chair Baseline Vocal Quality: Normal(min dysfluent; verbose) Volitional Cough: Strong Volitional Swallow: Able to elicit    Oral/Motor/Sensory Function Overall Oral Motor/Sensory Function: Mild impairment Facial ROM: Within Functional Limits Facial Symmetry: Within Functional Limits Facial Strength: Within Functional Limits Facial Sensation: Within Functional Limits Lingual ROM: Within Functional Limits Lingual Symmetry: Within Functional Limits Lingual Strength: Reduced(mild-mod; mid-tongue body) Lingual Sensation: Within Functional Limits Velum: Within Functional Limits Mandible: Within Functional Limits   Ice Chips Ice chips: Within functional limits Presentation: Spoon(fed; 2 trials)   Thin Liquid Thin Liquid: Within functional limits Presentation: Cup;Self Fed(5 trials)    Nectar Thick Nectar Thick Liquid: Not tested   Honey Thick Honey Thick Liquid: Not tested   Puree Puree: Within functional limits Presentation: Self Fed;Spoon(3 trials)   Solid     Solid: Impaired Presentation: Self Fed(3 trials) Oral Phase Impairments: Impaired mastication(Edentulous baseline) Oral Phase Functional Implications: Impaired  mastication(needed time, moistening ) Pharyngeal Phase Impairments: (  none)       Orinda Kenner, MS, CCC-SLP , 10/19/2019,11:22 AM

## 2019-10-19 NOTE — BHH Counselor (Signed)
CSW spoke with Patty Bristow(Medicaid Solicitor) who reports the location of the group home is located in [place]. She states the closest dialysis center to that location is [facility] on [address]. She also states the distance between the group home and pt current dialysis center is 7.4 mi but still need to be coordinated with [place]. CSW spoke with Delphina at [facility] who states they are unaware of the DSS worker that the pt has been assigned to. CSW informed them someone from [facility] visited the pt on 5/19 but staff unsure of the name of the person. Delphina states she will assist with contacting [place] to coordinate medicaid transportation and call Levander Campion at [place] to see who the case has been assigned to. CSW informed her the pt has been accepted at [facility] group home and move in date scheduled for 5/25. CSW also informed her transportation will need to be arranged prior to his admission.

## 2019-10-19 NOTE — BHH Counselor (Signed)
CSW spoke with Elvera Bicker, dialysis coordinator [phone] who states she will contact the pt dialysis clinic to see if they can assist with arranging transportation to his dialysis appointments when he transitions to the group home.

## 2019-10-20 LAB — RENAL FUNCTION PANEL
Albumin: 3.8 g/dL (ref 3.5–5.0)
Anion gap: 16 — ABNORMAL HIGH (ref 5–15)
BUN: 91 mg/dL — ABNORMAL HIGH (ref 6–20)
CO2: 24 mmol/L (ref 22–32)
Calcium: 9.5 mg/dL (ref 8.9–10.3)
Chloride: 96 mmol/L — ABNORMAL LOW (ref 98–111)
Creatinine, Ser: 8.58 mg/dL — ABNORMAL HIGH (ref 0.61–1.24)
GFR calc Af Amer: 7 mL/min — ABNORMAL LOW (ref >60)
GFR calc non Af Amer: 6 mL/min — ABNORMAL LOW (ref >60)
Glucose, Bld: 130 mg/dL — ABNORMAL HIGH (ref 70–99)
Phosphorus: 3.2 mg/dL (ref 2.5–4.6)
Potassium: 4.6 mmol/L (ref 3.5–5.1)
Sodium: 136 mmol/L (ref 135–145)

## 2019-10-20 LAB — CBC
HCT: 27.3 % — ABNORMAL LOW (ref 39.0–52.0)
Hemoglobin: 9.5 g/dL — ABNORMAL LOW (ref 13.0–17.0)
MCH: 32.9 pg (ref 26.0–34.0)
MCHC: 34.8 g/dL (ref 30.0–36.0)
MCV: 94.5 fL (ref 80.0–100.0)
Platelets: 180 10*3/uL (ref 150–400)
RBC: 2.89 MIL/uL — ABNORMAL LOW (ref 4.22–5.81)
RDW: 15 % (ref 11.5–15.5)
WBC: 6.1 10*3/uL (ref 4.0–10.5)
nRBC: 0 % (ref 0.0–0.2)

## 2019-10-20 MED ORDER — DIPHENHYDRAMINE HCL 25 MG PO CAPS
25.0000 mg | ORAL_CAPSULE | Freq: Once | ORAL | Status: AC
  Administered 2019-10-20: 25 mg via ORAL

## 2019-10-20 MED ORDER — METOPROLOL SUCCINATE ER 100 MG PO TB24
100.0000 mg | ORAL_TABLET | Freq: Every day | ORAL | Status: DC
  Administered 2019-10-21 – 2019-10-22 (×2): 100 mg via ORAL
  Filled 2019-10-20 (×2): qty 1

## 2019-10-20 NOTE — Progress Notes (Signed)
[facility]  ROUNDING NOTE   Subjective:   Seen and examined on hemodialysis treatment. Tolerating treatment well.   Blood pressure elevated. Did not get blood pressure medications this morning.     HEMODIALYSIS FLOWSHEET:  Blood Flow Rate (mL/min): 400 mL/min Arterial Pressure (mmHg): -130 mmHg Venous Pressure (mmHg): 290 mmHg Transmembrane Pressure (mmHg): 60 mmHg Ultrafiltration Rate (mL/min): 1140 mL/min Dialysate Flow Rate (mL/min): 600 ml/min Conductivity: Machine : 13.9 Conductivity: Machine : 13.9 Dialysis Fluid Bolus: Normal Saline Bolus Amount (mL): 250 mL    Objective:  Vital signs in last 24 hours:  Temp:  [98.8 F (37.1 C)] 98.8 F (37.1 C) (05/21 0640) Pulse Rate:  [70-81] 76 (05/21 1000) Resp:  [12-20] 20 (05/21 1000) BP: (135-209)/(87-111) 205/87 (05/21 1000) SpO2:  [98 %] 98 % (05/21 0830)  Weight change:  Filed Weights   10/04/19 2346 10/06/19 0915  Weight: 69.4 kg 69.4 kg    Intake/Output: No intake/output data recorded.   Intake/Output this shift:  No intake/output data recorded.  Physical Exam: General: NAD, sitting in chair  Head: Normocephalic, atraumatic. Moist oral mucosal membranes  Eyes: Anicteric, PERRL  Neck: Supple, trachea midline  Lungs:  Clear to auscultation  Heart: Regular rate and rhythm  Abdomen:  Soft, nontender,   Extremities: + peripheral edema.  Neurologic: Nonfocal, moving all four extremities  Skin: No lesions  Access: Left AVF    Basic Metabolic Panel: Recent Labs  Lab 10/16/19 0646 10/20/19 0845  NA 135 136  K 5.2* 4.6  CL 94* 96*  CO2 25 24  GLUCOSE 86 130*  BUN 91* 91*  CREATININE 9.27* 8.58*  CALCIUM 10.1 9.5  PHOS 4.8* 3.2    Liver Function Tests: Recent Labs  Lab 10/16/19 0646 10/20/19 0845  ALBUMIN 3.8 3.8   No results for input(s): LIPASE, AMYLASE in the last 168 hours. No results for input(s): AMMONIA in the last 168 hours.  CBC: Recent Labs  Lab 10/16/19 0646  10/20/19 0845  WBC 5.6 6.1  HGB 10.9* 9.5*  HCT 31.2* 27.3*  MCV 94.5 94.5  PLT 216 180    Cardiac Enzymes: No results for input(s): CKTOTAL, CKMB, CKMBINDEX, TROPONINI in the last 168 hours.  BNP: Invalid input(s): POCBNP  CBG: No results for input(s): GLUCAP in the last 168 hours.  Microbiology: Results for orders placed or performed during the hospital encounter of 10/04/19  Respiratory Panel by RT PCR (Flu A&B, Covid) - Nasopharyngeal Swab     Status: None   Collection Time: 10/04/19  6:26 PM   Specimen: Nasopharyngeal Swab  Result Value Ref Range Status   SARS Coronavirus 2 by RT PCR NEGATIVE NEGATIVE Final    Comment: (NOTE) SARS-CoV-2 target nucleic acids are NOT DETECTED. The SARS-CoV-2 RNA is generally detectable in upper respiratoy specimens during the acute phase of infection. The lowest concentration of SARS-CoV-2 viral copies this assay can detect is 131 copies/mL. A negative result does not preclude SARS-Cov-2 infection and should not be used as the sole basis for treatment or other patient management decisions. A negative result may occur with  improper specimen collection/handling, submission of specimen other than nasopharyngeal swab, presence of viral mutation(s) within the areas targeted by this assay, and inadequate number of viral copies (<131 copies/mL). A negative result must be combined with clinical observations, patient history, and epidemiological information. The expected result is Negative. Fact Sheet for Patients:  PinkCheek.be Fact Sheet for Healthcare Providers:  GravelBags.it This test is not yet ap proved or  cleared by the [place] and  has been authorized for detection and/or diagnosis of SARS-CoV-2 by FDA under an Emergency Use Authorization (EUA). This EUA will remain  in effect (meaning this test can be used) for the duration of the COVID-19 declaration under Section  564(b)(1) of the Act, 21 U.S.C. section 360bbb-3(b)(1), unless the authorization is terminated or revoked sooner.    Influenza A by PCR NEGATIVE NEGATIVE Final   Influenza B by PCR NEGATIVE NEGATIVE Final    Comment: (NOTE) The Xpert Xpress SARS-CoV-2/FLU/RSV assay is intended as an aid in  the diagnosis of influenza from Nasopharyngeal swab specimens and  should not be used as a sole basis for treatment. Nasal washings and  aspirates are unacceptable for Xpert Xpress SARS-CoV-2/FLU/RSV  testing. Fact Sheet for Patients: PinkCheek.be Fact Sheet for Healthcare Providers: GravelBags.it This test is not yet approved or cleared by the [place] FDA and  has been authorized for detection and/or diagnosis of SARS-CoV-2 by  FDA under an Emergency Use Authorization (EUA). This EUA will remain  in effect (meaning this test can be used) for the duration of the  Covid-19 declaration under Section 564(b)(1) of the Act, 21  U.S.C. section 360bbb-3(b)(1), unless the authorization is  terminated or revoked. Performed at [facility], [address] [place], [place] [address]     Coagulation Studies: No results for input(s): LABPROT, INR in the last 72 hours.  Urinalysis: No results for input(s): COLORURINE, LABSPEC, PHURINE, GLUCOSEU, HGBUR, BILIRUBINUR, KETONESUR, PROTEINUR, UROBILINOGEN, NITRITE, LEUKOCYTESUR in the last 72 hours.  Invalid input(s): APPERANCEUR    Imaging: No results found.   Medications:    . amLODipine  10 mg Oral Daily  . calcium acetate  1,334 mg Oral TID WC  . carbamazepine  1,000 mg Oral BH-q7a  . carvedilol  25 mg Oral BID WC  . Chlorhexidine Gluconate Cloth  6 each Topical Q0600  . cholecalciferol  1,000 Units Oral Daily  . cloNIDine  0.3 mg Oral TID  . docusate sodium  100 mg Oral BID  . ferrous sulfate  325 mg Oral Daily  . fluPHENAZine  5 mg Oral BID  . hydrALAZINE  100 mg Oral  Q8H  . levothyroxine  100 mcg Oral Q0600  . metoprolol succinate  50 mg Oral Daily  . mometasone-formoterol  2 puff Inhalation BID  . multivitamin  1 tablet Oral Daily  . nicotine  21 mg Transdermal Daily  . pantoprazole  40 mg Oral BID  . temazepam  15 mg Oral QHS  . trihexyphenidyl  2 mg Oral BID  . tuberculin  5 Units Intradermal Once   acetaminophen, albuterol, heparin, hydrOXYzine, senna  Assessment/ Plan:  Todd Brown is a 59 y.o. black male with end stage renal disease on hemodialysis, hypertension, schizophrenia, hepatitis C, history of DVT.   [place] Left AVF 73.5kg  1. End Stage Renal Disease: seen and examined on hemodialysis treatment. Continue MWF treatment.   2. Hypertension: elevated 205/87 on treatment. Home regimen of amlodipine, carvedilol, clonidine, hydralazine.  - Consider IV hydralazine PRN if further control needed.   3. Anemia of chronic kidney disease: hemoglobin 9.5 Hold EPO due to hypertension  4. Secondary Hyperparathyroidism: calcium and phosphorus at goal. Outpatient PTH at goal.  - calcium acetate with meals.    LOS: [place] 5/21/202110:12 AM

## 2019-10-20 NOTE — Progress Notes (Signed)
Physical Therapy Treatment Patient Details Name: Todd Brown MRN: [id number] DOB: 03-20-1961 Today's Date: 10/20/2019    History of Present Illness 59 y/o male with shizophrenia referred to hospital secondary to missing multiple dialysis session and generally seeming less able to care for himself and with increased irritibility.    PT Comments    Pt in day room watching tv upon arrival.  Stood and was able to walk on unit with no AD and occasional imbalances but needed no outside assist.  He does state he prefers to use his cane but it is not available at this time on [facility] unit.  He declined further activity due to overall fatigue from dialysis this am.   Follow Up Recommendations  Home health PT     Equipment Recommendations  Cane    Recommendations for Other Services       Precautions / Restrictions Precautions Precautions: Fall Restrictions Weight Bearing Restrictions: No    Mobility  Bed Mobility               General bed mobility comments: in day room before and after session  Transfers Overall transfer level: Independent Equipment used: None                Ambulation/Gait Ambulation/Gait assistance: Supervision;Min guard Gait Distance (Feet): 300 Feet Assistive device: None Gait Pattern/deviations: Step-through pattern;Staggering right;Staggering left;Wide base of support Gait velocity: decreased   General Gait Details: occasional imbalances but overall improved from previous sessions.   Stairs             Wheelchair Mobility    Modified Rankin (Stroke Patients Only)       Balance Overall balance assessment: Modified Independent;Mild deficits observed, not formally tested                                          Cognition Arousal/Alertness: Awake/alert Behavior During Therapy: WFL for tasks assessed/performed Overall Cognitive Status: Difficult to assess                                  General Comments: mumbles at times and difficult to understand      Exercises      General Comments        Pertinent Vitals/Pain Pain Assessment: No/denies pain    Home Living                      Prior Function            PT Goals (current goals can now be found in the care plan section) Progress towards PT goals: Progressing toward goals    Frequency    Min 2X/week      PT Plan Current plan remains appropriate    Co-evaluation              AM-PAC PT "6 Clicks" Mobility   Outcome Measure  Help needed turning from your back to your side while in a flat bed without using bedrails?: None Help needed moving from lying on your back to sitting on the side of a flat bed without using bedrails?: None Help needed moving to and from a bed to a chair (including a wheelchair)?: None Help needed standing up from a chair using your arms (e.g., wheelchair or bedside chair)?: None Help needed  to walk in hospital room?: None Help needed climbing 3-5 steps with a railing? : A Little 6 Click Score: 23    End of Session Equipment Utilized During Treatment: Gait belt Activity Tolerance: Patient tolerated treatment well Patient left: in chair         Time: 1450-1500 PT Time Calculation (min) (ACUTE ONLY): 10 min  Charges:  $Gait Training: 8-22 mins                    Chesley Noon, PTA 10/20/19, 3:09 PM

## 2019-10-20 NOTE — Progress Notes (Signed)
Hd completed 

## 2019-10-20 NOTE — Progress Notes (Signed)
PT Cancellation Note  Patient Details Name: Todd Brown MRN: [id number] DOB: May 23, 1961   Cancelled Treatment:    Reason Eval/Treat Not Completed: Patient at procedure or test/unavailable   Pt off unit during attempt.  Will continue as appropriate.   Chesley Noon 10/20/2019, 11:54 AM

## 2019-10-20 NOTE — BHH Counselor (Signed)
CSW spoke with Delphina at [facility] who states she spoke with Rachel Bo at [facility] at [facility] who initially declined pt referral but is considering accepting him. She says Rachel Bo would like to speak with a medical provider to receive an update on the pt progress. CSW informed Delphina the pt has attended his dialysis appointments and has not had any behavioral concerns since his admission. Delphina also reports she left a voicemail for pt medicaid caseworker(Carissa thaxton) today and is awaiting call back to coordinate transportation. She says she was informed by [facility] transportation coordinator the pt medicaid will need to be transferred to [facility] before transportation can be arranged.

## 2019-10-20 NOTE — Plan of Care (Signed)
The patient is cooperative and pleasant.  Problem: Coping: Goal: Ability to identify and develop effective coping behavior will improve Outcome: Progressing Goal: Ability to interact with others will improve Outcome: Progressing

## 2019-10-20 NOTE — Progress Notes (Signed)
Recreation Therapy Notes   Date: 10/20/2019  Time: 9:30 am   Location: Craft room    Behavioral response: N/A   Intervention Topic: Self-esteem   Discussion/Intervention: Patient did not attend group.   Clinical Observations/Feedback:  Patient did not attend group.   Janesa Dockery LRT/CTRS        Seanne Chirico 10/20/2019 11:31 AM

## 2019-10-20 NOTE — BHH Counselor (Signed)
CSW spoke with [facility].  CSW was told that someone from [facility] had come to complete a guardianship evaluations as the ACT team had requested guardianship for the patient.  She reports that [facility] has 45 days to complete the evaluation and determine if the patient meets the need of having a guardian.  Lawana Pai, [phone] completed the evaluation and is the SW currently working on the case.  Assunta Curtis, MSW, LCSW 10/20/2019 1:09 PM

## 2019-10-20 NOTE — Progress Notes (Signed)
Hd started  

## 2019-10-20 NOTE — BHH Group Notes (Signed)
Balance In Life 10/20/2019 9:30AM/1PM  Type of Therapy/Topic:  Group Therapy:  Balance in Life  Participation Level:  Did Not Attend  Description of Group:   This group will address the concept of balance and how it feels and looks when one is unbalanced. Patients will be encouraged to process areas in their lives that are out of balance and identify reasons for remaining unbalanced. Facilitators will guide patients in utilizing problem-solving interventions to address and correct the stressor making their life unbalanced. Understanding and applying boundaries will be explored and addressed for obtaining and maintaining a balanced life. Patients will be encouraged to explore ways to assertively make their unbalanced needs known to significant others in their lives, using other group members and facilitator for support and feedback.  Therapeutic Goals: 1. Patient will identify two or more emotions or situations they have that consume much of in their lives. 2. Patient will identify signs/triggers that life has become out of balance:  3. Patient will identify two ways to set boundaries in order to achieve balance in their lives:  4. Patient will demonstrate ability to communicate their needs through discussion and/or role plays  Summary of Patient Progress:    Therapeutic Modalities:   Cognitive Behavioral Therapy Solution-Focused Therapy Assertiveness Training  Denecia Brunette Lynelle Smoke, LCSW

## 2019-10-20 NOTE — Progress Notes (Signed)
[facility] MD Progress Note  10/20/2019 4:23 PM Todd Brown  MRN:  [id number] Subjective: Follow-up for this patient with schizophrenia.  Patient has no new complaints.  Abdominal discomfort seems to have improved.  Remains disorganized in his speech but basically able to take care of his ADLs.  No aggressive behavior.  We are told that a bed will be available for him on the 25th.  Meantime I will increase the dose of his metoprolol as his blood pressure remains elevated. Principal Problem: Schizophrenia ([place]) Diagnosis: Principal Problem:   Schizophrenia ([place]) Active Problems:   Essential hypertension, benign   Chronic kidney disease   Hepatitis C   End stage renal disease ([place])   Dialysis patient ([employer])  Total Time spent with patient: 30 minutes  Past Psychiatric History: Past history of schizophrenia chronic difficulty with placement  Past Medical History:  Past Medical History:  Diagnosis Date  . Anemia   . Diabetes mellitus without complication ([place])   . Dialysis patient [facility])    Mon. -Wed.- Fri  . DVT (deep venous thrombosis) ([place])    cephalic and basolic vein thrombosis  . ESRD (end stage renal disease) ([place])   . Hyperlipidemia   . Malignant hypertension   . Renal artery stenosis ([place])   . Schizophrenia [facility])     Past Surgical History:  Procedure Laterality Date  . A/V FISTULAGRAM N/A 08/06/2016   Procedure: A/V Fistulagram;  Surgeon: Algernon Huxley, MD;  Location: [place] CV LAB;  Service: Cardiovascular;  Laterality: N/A;  . A/V SHUNT INTERVENTION N/A 08/06/2016   Procedure: A/V Shunt Intervention;  Surgeon: Algernon Huxley, MD;  Location: [place] [facility];  Service: Cardiovascular;  Laterality: N/A;  . AV FISTULA PLACEMENT Left 12/26/2014   Procedure: ARTERIOVENOUS (AV) FISTULA CREATION;  Surgeon: Algernon Huxley, MD;  Location: [facility] ORS;  Service: Vascular;  Laterality: Left;  . ESOPHAGOGASTRODUODENOSCOPY (EGD) WITH PROPOFOL N/A 05/06/2017   Procedure:  ESOPHAGOGASTRODUODENOSCOPY (EGD) WITH PROPOFOL;  Surgeon: Jonathon Bellows, MD;  Location: [facility] ENDOSCOPY;  Service: Gastroenterology;  Laterality: N/A;  . INSERTION OF DIALYSIS CATHETER Right   . PERIPHERAL VASCULAR CATHETERIZATION N/A 10/22/2014   Procedure: Dialysis/Perma Catheter Insertion;  Surgeon: Algernon Huxley, MD;  Location: [place] [facility];  Service: Cardiovascular;  Laterality: N/A;  . PERIPHERAL VASCULAR CATHETERIZATION N/A 02/28/2015   Procedure: Dialysis/Perma Catheter Removal;  Surgeon: Algernon Huxley, MD;  Location: [place] [facility];  Service: Cardiovascular;  Laterality: N/A;  . Repair fx left lower leg     yrs ago (age 80)   Family History:  Family History  Problem Relation Age of Onset  . Diabetes Neg Hx    Family Psychiatric  History: See previous Social History:  Social History   Substance and Sexual Activity  Alcohol Use No     Social History   Substance and Sexual Activity  Drug Use Yes  . Frequency: 1.0 times per week  . Types: Marijuana    Social History   Socioeconomic History  . Marital status: Single    Spouse name: Not on file  . Number of children: Not on file  . Years of education: Not on file  . Highest education level: Not on file  Occupational History  . Occupation: Disability   Tobacco Use  . Smoking status: Current Every Day Smoker    Packs/day: 0.50    Types: Cigarettes  . Smokeless tobacco: Never Used  . Tobacco comment: 10 cigars a day  Substance and  Sexual Activity  . Alcohol use: No  . Drug use: Yes    Frequency: 1.0 times per week    Types: Marijuana  . Sexual activity: Not Currently  Other Topics Concern  . Not on file  Social History Narrative  . Not on file   Social Determinants of Health   Financial Resource Strain:   . Difficulty of Paying Living Expenses:   Food Insecurity:   . Worried About Charity fundraiser in the Last Year:   . Arboriculturist in the Last Year:   Transportation Needs:   . Lexicographer (Medical):   Marland Kitchen Lack of Transportation (Non-Medical):   Physical Activity:   . Days of Exercise per Week:   . Minutes of Exercise per Session:   Stress:   . Feeling of Stress :   Social Connections:   . Frequency of Communication with Friends and Family:   . Frequency of Social Gatherings with Friends and Family:   . Attends Religious Services:   . Active Member of Clubs or Organizations:   . Attends Archivist Meetings:   Marland Kitchen Marital Status:    Additional Social History:                         Sleep: Fair  Appetite:  Fair  Current Medications: Current Facility-Administered Medications  Medication Dose Route Frequency Provider Last Rate Last Admin  . acetaminophen (TYLENOL) tablet 500 mg  500 mg Oral Q4H PRN Caroline Sauger, NP   500 mg at 10/20/19 1345  . albuterol (VENTOLIN HFA) 108 (90 Base) MCG/ACT inhaler 1-2 puff  1-2 puff Inhalation Q6H PRN Sharma Covert, MD   2 puff at 10/20/19 0749  . amLODipine (NORVASC) tablet 10 mg  10 mg Oral Daily Izek Corvino, Madie Reno, MD   Stopped at 10/20/19 [phone]  . calcium acetate (PHOSLO) capsule 1,334 mg  1,334 mg Oral TID WC Caroline Sauger, NP   1,334 mg at 10/20/19 1309  . carbamazepine (TEGRETOL XR) 12 hr tablet 1,000 mg  1,000 mg Oral Chester Holstein, Geni Bers, NP   1,000 mg at 10/20/19 1319  . carvedilol (COREG) tablet 25 mg  25 mg Oral BID WC Adaly Puder, Madie Reno, MD   25 mg at 10/19/19 1652  . Chlorhexidine Gluconate Cloth 2 % PADS 6 each  6 each Topical Q0600 Murlean Iba, MD   6 each at 10/17/19 [phone]  . cholecalciferol (VITAMIN D3) tablet 1,000 Units  1,000 Units Oral Daily Caroline Sauger, NP   1,000 Units at 10/20/19 0743  . cloNIDine (CATAPRES) tablet 0.3 mg  0.3 mg Oral TID Larita Fife, MD   0.3 mg at 10/20/19 1309  . docusate sodium (COLACE) capsule 100 mg  100 mg Oral BID Kaitlynne Wenz, Madie Reno, MD   100 mg at 10/20/19 0743  . ferrous sulfate tablet 325 mg  325 mg Oral Daily Caroline Sauger, NP   325 mg at 10/20/19 0743  . fluPHENAZine (PROLIXIN) tablet 5 mg  5 mg Oral BID Sharma Covert, MD   5 mg at 10/20/19 0747  . heparin injection 1,400 Units  20 Units/kg Dialysis PRN Murlean Iba, MD      . hydrALAZINE (APRESOLINE) tablet 100 mg  100 mg Oral Q8H Sharma Covert, MD   100 mg at 10/20/19 1309  . hydrOXYzine (ATARAX/VISTARIL) tablet 50 mg  50 mg Oral Q6H PRN Tarena Gockley, Madie Reno, MD   50 mg at  10/19/19 2218  . levothyroxine (SYNTHROID) tablet 100 mcg  100 mcg Oral Q0600 Sharma Covert, MD   100 mcg at 10/20/19 [phone]  . [START ON 10/21/2019] metoprolol succinate (TOPROL-XL) 24 hr tablet 100 mg  100 mg Oral Daily Loki Wuthrich T, MD      . mometasone-formoterol (DULERA) 200-5 MCG/ACT inhaler 2 puff  2 puff Inhalation BID Caroline Sauger, NP   2 puff at 10/20/19 0744  . multivitamin (RENA-VIT) tablet 1 tablet  1 tablet Oral Daily Caroline Sauger, NP   1 tablet at 10/20/19 0747  . nicotine (NICODERM CQ - dosed in mg/24 hours) patch 21 mg  21 mg Transdermal Daily Avery Klingbeil, Madie Reno, MD   21 mg at 10/20/19 0744  . pantoprazole (PROTONIX) EC tablet 40 mg  40 mg Oral BID Mikia Delaluz, Madie Reno, MD   40 mg at 10/20/19 0743  . senna (SENOKOT) tablet 17.2 mg  2 tablet Oral Daily PRN Tazaria Dlugosz, Madie Reno, MD   17.2 mg at 10/16/19 1257  . temazepam (RESTORIL) capsule 15 mg  15 mg Oral QHS Ariston Grandison, Madie Reno, MD   15 mg at 10/19/19 2218  . trihexyphenidyl (ARTANE) tablet 2 mg  2 mg Oral BID Mekala Winger, Madie Reno, MD   2 mg at 10/20/19 0748  . tuberculin injection 5 Units  5 Units Intradermal Once Sarahy Creedon, Madie Reno, MD   5 Units at 10/20/19 1348    Lab Results:  Results for orders placed or performed during the hospital encounter of 10/04/19 (from the past 48 hour(s))  CBC     Status: Abnormal   Collection Time: 10/20/19  8:45 AM  Result Value Ref Range   WBC 6.1 4.0 - 10.5 K/uL   RBC 2.89 (L) 4.22 - 5.81 MIL/uL   Hemoglobin 9.5 (L) 13.0 - 17.0 g/dL   HCT 27.3 (L) 39.0 - 52.0 %   MCV  94.5 80.0 - 100.0 fL   MCH 32.9 26.0 - 34.0 pg   MCHC 34.8 30.0 - 36.0 g/dL   RDW 15.0 11.5 - 15.5 %   Platelets 180 150 - 400 K/uL   nRBC 0.0 0.0 - 0.2 %    Comment: Performed at [facility], [address] [place], [place] [address]  Renal function panel     Status: Abnormal   Collection Time: 10/20/19  8:45 AM  Result Value Ref Range   Sodium 136 135 - 145 mmol/L   Potassium 4.6 3.5 - 5.1 mmol/L   Chloride 96 (L) 98 - 111 mmol/L   CO2 24 22 - 32 mmol/L   Glucose, Bld 130 (H) 70 - 99 mg/dL    Comment: Glucose reference range applies only to samples taken after fasting for at least 8 hours.   BUN 91 (H) 6 - 20 mg/dL   Creatinine, Ser 8.58 (H) 0.61 - 1.24 mg/dL   Calcium 9.5 8.9 - 10.3 mg/dL   Phosphorus 3.2 2.5 - 4.6 mg/dL   Albumin 3.8 3.5 - 5.0 g/dL   GFR calc non Af Amer 6 (L) >60 mL/min   GFR calc Af Amer 7 (L) >60 mL/min   Anion gap 16 (H) 5 - 15    Comment: Performed at [facility], [address] [place], [place] [address]    Blood Alcohol level:  Lab Results  Component Value Date   [facility] <10 10/04/2019   ETH <5 69/67/8938    Metabolic Disorder Labs: Lab Results  Component Value Date   HGBA1C 5.0 09/12/2018  MPG 97 09/12/2018   No results found for: PROLACTIN Lab Results  Component Value Date   CHOL 186 10/06/2019   TRIG 75 10/06/2019   HDL 77 10/06/2019   CHOLHDL 2.4 10/06/2019   VLDL 15 10/06/2019   LDLCALC 94 10/06/2019    Physical Findings: AIMS:  , ,  ,  ,    CIWA:    COWS:     Musculoskeletal: Strength & Muscle Tone: within normal limits Gait & Station: normal Patient leans: N/A  Psychiatric Specialty Exam: Physical Exam  Nursing note and vitals reviewed. Constitutional: He appears well-developed and well-nourished.  HENT:  Head: Normocephalic and atraumatic.  Eyes: Pupils are equal, round, and reactive to light. Conjunctivae are normal.  Cardiovascular: Regular rhythm and normal heart sounds.  Respiratory:  Effort normal. No respiratory distress.  GI: Soft.  Musculoskeletal:        General: Normal range of motion.     Cervical back: Normal range of motion.  Neurological: He is alert.  Skin: Skin is warm and dry.  Psychiatric: His affect is blunt. His speech is delayed. He is slowed. Thought content is delusional. Cognition and memory are impaired. He expresses impulsivity. He expresses no homicidal and no suicidal ideation.    Review of Systems  Constitutional: Negative.   HENT: Negative.   Eyes: Negative.   Respiratory: Negative.   Cardiovascular: Negative.   Gastrointestinal: Negative.   Musculoskeletal: Negative.   Skin: Negative.   Neurological: Negative.   Psychiatric/Behavioral: Negative.     Blood pressure (!) 179/94, pulse 72, temperature 97.8 F (36.6 C), temperature source Oral, resp. rate 15, height 6\' 3"  (1.905 m), weight 69.4 kg, SpO2 98 %.Body mass index is 19.12 kg/m.  General Appearance: Disheveled  Eye Contact:  Minimal  Speech:  Slow  Volume:  Decreased  Mood:  Euthymic  Affect:  Congruent  Thought Process:  Goal Directed  Orientation:  Full (Time, Place, and Person)  Thought Content:  Logical  Suicidal Thoughts:  No  Homicidal Thoughts:  No  Memory:  Immediate;   Fair Recent;   Fair Remote;   Fair  Judgement:  Fair  Insight:  Fair  Psychomotor Activity:  Normal  Concentration:  Concentration: Fair  Recall:  [employer] of Knowledge:  Fair  Language:  Fair  Akathisia:  No  Handed:  Right  AIMS (if indicated):     Assets:  Desire for Improvement  ADL's:  Impaired  Cognition:  Impaired,  Mild  Sleep:  Number of Hours: 6.5     Treatment Plan Summary: Daily contact with patient to assess and evaluate symptoms and progress in treatment, Medication management and Plan Increase metoprolol to 100 mg.  No change to other high antihypertensive medicine.  Order for COVID test to be done Sunday to facilitate likely transfer on Tuesday  Alethia Berthold,  MD 10/20/2019, 4:23 PM

## 2019-10-20 NOTE — Progress Notes (Signed)
Patient alert and oriented X 4, affect is flat but he brighten upon approach, he denies SI/HI/AVH he was ambulating on the milieu without distress no SOB on exertion. Patient is complaint with medication regimen, he denies  generalized pain and ws medicated as ordered. Patient is scheduled for dialysis Friday, 15 minutes safety checks maintained.

## 2019-10-20 NOTE — Progress Notes (Signed)
F - Maintain overall health.  D - The patient was calm, cooperative, and pleasant.  He ate breakfast, accepted morning medications, and received dialysis.  [name] denied thoughts of harming himself and others.  No symptoms of psychosis noted.  The patient's mood was stable and euthymic.  Overall, no significant changes to note.  A - Medications provided as ordered, along with PPD sub-Q during the afternoon.  R - Continue with care and secure placement.

## 2019-10-21 NOTE — Progress Notes (Signed)
Physical Therapy Treatment Patient Details Name: Todd Brown MRN: [id number] DOB: 1960-11-22 Today's Date: 10/21/2019    History of Present Illness 59 y/o male with shizophrenia referred to hospital secondary to missing multiple dialysis session and generally seeming less able to care for himself and with increased irritibility.    PT Comments    Pt was seated in TV watching TV upon arriving. He agrees to session and is pleasant throughout. Verbalizes several times how he enjoys walking and working with PT. He ambulated 400 ft without AD with some unsteadiness noted. He will benefit from skilled Home health PT after DC to continue efforts to improve balance, strength, and safe functional mobility. Will benefit from [facility] at DC for improved safety with ambulation. Overall pt tolerated session well and requesting therapist return soon to ambulate more often.    Follow Up Recommendations  Home health PT     Equipment Recommendations  Cane    Recommendations for Other Services       Precautions / Restrictions Precautions Precautions: Fall Restrictions Weight Bearing Restrictions: No    Mobility  Bed Mobility               General bed mobility comments: pt was seated in TV room upon arriving. was seated in chair at conclusion of session.  Transfers Overall transfer level: Independent                  Ambulation/Gait Ambulation/Gait assistance: Supervision;Min guard Gait Distance (Feet): 400 Feet Assistive device: None Gait Pattern/deviations: Narrow base of support;Scissoring;Staggering left;Staggering right Gait velocity: WNL   General Gait Details: Pt has several occasions of unsteadiness during gait however dow not rely on therapist intervention to correct. He did reach for hallway railing several times but overall tolerated gait training well. reports to using walking stick prior to admission. Pt requested PT contnue to come ambulate with him in future  sessions.   Stairs             Wheelchair Mobility    Modified Rankin (Stroke Patients Only)       Balance                                            Cognition Arousal/Alertness: Awake/alert Behavior During Therapy: WFL for tasks assessed/performed Overall Cognitive Status: History of cognitive impairments - at baseline                                 General Comments: diffuicult to understand at time but pt very cooperative and pleasant. Agrees to PT session and requested continued PT to ambulate more. (likes the company/socialization)      Exercises      General Comments        Pertinent Vitals/Pain Pain Assessment: No/denies pain    Home Living                      Prior Function            PT Goals (current goals can now be found in the care plan section) Acute Rehab PT Goals Patient Stated Goal: " I like to keep on walking" Progress towards PT goals: Progressing toward goals    Frequency    Min 2X/week      PT Plan Current plan  remains appropriate    Co-evaluation              AM-PAC PT "6 Clicks" Mobility   Outcome Measure  Help needed turning from your back to your side while in a flat bed without using bedrails?: None Help needed moving from lying on your back to sitting on the side of a flat bed without using bedrails?: None Help needed moving to and from a bed to a chair (including a wheelchair)?: None Help needed standing up from a chair using your arms (e.g., wheelchair or bedside chair)?: None Help needed to walk in hospital room?: None Help needed climbing 3-5 steps with a railing? : A Little 6 Click Score: 23    End of Session Equipment Utilized During Treatment: Gait belt Activity Tolerance: Patient tolerated treatment well Patient left: in chair Nurse Communication: Mobility status;Other (comment) PT Visit Diagnosis: Difficulty in walking, not elsewhere classified  (R26.2);Unsteadiness on feet (R26.81);Muscle weakness (generalized) (M62.81)     Time: 2831-5176 PT Time Calculation (min) (ACUTE ONLY): 11 min  Charges:  $Gait Training: 8-22 mins                     Julaine Fusi PTA 10/21/19, 1:13 PM

## 2019-10-21 NOTE — Progress Notes (Signed)
[facility] MD Progress Note  10/21/2019 12:30 PM Todd Brown  MRN:  [id number]  [name] is a 59yo M with h/o Schizophrenia, who was admitted to [facility] unit due to agitation.  Patient seen.  Chart reviewed. Patient discussed with nursing; no overnight events reported. Last BP was 163/38mmHg.  Subjective:  Patient reports "I am good". "I need to take blood pressure medications". He denies any complaints today - denies feeling depressed, anxious, suicidal, homicidal, "I never said I was suicidal or homicidal", denies paranoid thoughts, denies hallucinations. Patient denies any physical complaints. He is compliant with medications and dialysis sessions.  He denies side effects from his medications. He is hopeful for transfer to a residential facility next week.  Principal Problem: Schizophrenia ([place]) Diagnosis: Principal Problem:   Schizophrenia ([place]) Active Problems:   Essential hypertension, benign   Chronic kidney disease   Hepatitis C   End stage renal disease ([place])   Dialysis patient ([place])  Total Time spent with patient: 15 minutes  Past Psychiatric History: see H&P  Past Medical History:  Past Medical History:  Diagnosis Date  . Anemia   . Diabetes mellitus without complication ([place])   . Dialysis patient [facility])    Mon. -Wed.- Fri  . DVT (deep venous thrombosis) ([place])    cephalic and basolic vein thrombosis  . ESRD (end stage renal disease) ([place])   . Hyperlipidemia   . Malignant hypertension   . Renal artery stenosis ([place])   . Schizophrenia [facility])     Past Surgical History:  Procedure Laterality Date  . A/V FISTULAGRAM N/A 08/06/2016   Procedure: A/V Fistulagram;  Surgeon: Algernon Huxley, MD;  Location: [place] CV LAB;  Service: Cardiovascular;  Laterality: N/A;  . A/V SHUNT INTERVENTION N/A 08/06/2016   Procedure: A/V Shunt Intervention;  Surgeon: Algernon Huxley, MD;  Location: [place] CV LAB;  Service: Cardiovascular;  Laterality: N/A;  . AV FISTULA PLACEMENT Left  12/26/2014   Procedure: ARTERIOVENOUS (AV) FISTULA CREATION;  Surgeon: Algernon Huxley, MD;  Location: [facility] ORS;  Service: Vascular;  Laterality: Left;  . ESOPHAGOGASTRODUODENOSCOPY (EGD) WITH PROPOFOL N/A 05/06/2017   Procedure: ESOPHAGOGASTRODUODENOSCOPY (EGD) WITH PROPOFOL;  Surgeon: Jonathon Bellows, MD;  Location: [facility] ENDOSCOPY;  Service: Gastroenterology;  Laterality: N/A;  . INSERTION OF DIALYSIS CATHETER Right   . PERIPHERAL VASCULAR CATHETERIZATION N/A 10/22/2014   Procedure: Dialysis/Perma Catheter Insertion;  Surgeon: Algernon Huxley, MD;  Location: [place] CV LAB;  Service: Cardiovascular;  Laterality: N/A;  . PERIPHERAL VASCULAR CATHETERIZATION N/A 02/28/2015   Procedure: Dialysis/Perma Catheter Removal;  Surgeon: Algernon Huxley, MD;  Location: [place] CV LAB;  Service: Cardiovascular;  Laterality: N/A;  . Repair fx left lower leg     yrs ago (age 68)   Family History:  Family History  Problem Relation Age of Onset  . Diabetes Neg Hx    Family Psychiatric  History: see H&P Social History:  Social History   Substance and Sexual Activity  Alcohol Use No     Social History   Substance and Sexual Activity  Drug Use Yes  . Frequency: 1.0 times per week  . Types: Marijuana    Social History   Socioeconomic History  . Marital status: Single    Spouse name: Not on file  . Number of children: Not on file  . Years of education: Not on file  . Highest education level: Not on file  Occupational History  . Occupation: Disability   Tobacco Use  .  Smoking status: Current Every Day Smoker    Packs/day: 0.50    Types: Cigarettes  . Smokeless tobacco: Never Used  . Tobacco comment: 10 cigars a day  Substance and Sexual Activity  . Alcohol use: No  . Drug use: Yes    Frequency: 1.0 times per week    Types: Marijuana  . Sexual activity: Not Currently  Other Topics Concern  . Not on file  Social History Narrative  . Not on file   Social Determinants of Health   Financial  Resource Strain:   . Difficulty of Paying Living Expenses:   Food Insecurity:   . Worried About Charity fundraiser in the Last Year:   . Arboriculturist in the Last Year:   Transportation Needs:   . Film/video editor (Medical):   Marland Kitchen Lack of Transportation (Non-Medical):   Physical Activity:   . Days of Exercise per Week:   . Minutes of Exercise per Session:   Stress:   . Feeling of Stress :   Social Connections:   . Frequency of Communication with Friends and Family:   . Frequency of Social Gatherings with Friends and Family:   . Attends Religious Services:   . Active Member of Clubs or Organizations:   . Attends Archivist Meetings:   Marland Kitchen Marital Status:    Additional Social History:                         Sleep: Fair  Appetite:  Good  Current Medications: Current Facility-Administered Medications  Medication Dose Route Frequency Provider Last Rate Last Admin  . acetaminophen (TYLENOL) tablet 500 mg  500 mg Oral Q4H PRN Caroline Sauger, NP   500 mg at 10/21/19 0347  . albuterol (VENTOLIN HFA) 108 (90 Base) MCG/ACT inhaler 1-2 puff  1-2 puff Inhalation Q6H PRN Sharma Covert, MD   2 puff at 10/20/19 1813  . amLODipine (NORVASC) tablet 10 mg  10 mg Oral Daily Clapacs, Madie Reno, MD   10 mg at 10/21/19 0803  . calcium acetate (PHOSLO) capsule 1,334 mg  1,334 mg Oral TID WC Caroline Sauger, NP   1,334 mg at 10/21/19 1157  . carbamazepine (TEGRETOL XR) 12 hr tablet 1,000 mg  1,000 mg Oral Chester Holstein, Geni Bers, NP   1,000 mg at 10/21/19 0649  . carvedilol (COREG) tablet 25 mg  25 mg Oral BID WC Clapacs, Madie Reno, MD   25 mg at 10/21/19 0803  . Chlorhexidine Gluconate Cloth 2 % PADS 6 each  6 each Topical Q0600 Murlean Iba, MD   6 each at 10/17/19 [id number]  . cholecalciferol (VITAMIN D3) tablet 1,000 Units  1,000 Units Oral Daily Caroline Sauger, NP   1,000 Units at 10/21/19 0803  . cloNIDine (CATAPRES) tablet 0.3 mg  0.3 mg Oral TID Larita Fife, MD   0.3 mg at 10/21/19 1157  . docusate sodium (COLACE) capsule 100 mg  100 mg Oral BID Clapacs, Madie Reno, MD   100 mg at 10/21/19 0803  . ferrous sulfate tablet 325 mg  325 mg Oral Daily Caroline Sauger, NP   325 mg at 10/21/19 0803  . fluPHENAZine (PROLIXIN) tablet 5 mg  5 mg Oral BID Sharma Covert, MD   5 mg at 10/21/19 0804  . heparin injection 1,400 Units  20 Units/kg Dialysis PRN Murlean Iba, MD      . hydrALAZINE (APRESOLINE) tablet 100 mg  100 mg Oral  Q8H Sharma Covert, MD   100 mg at 10/21/19 0650  . hydrOXYzine (ATARAX/VISTARIL) tablet 50 mg  50 mg Oral Q6H PRN Clapacs, Madie Reno, MD   50 mg at 10/19/19 2218  . levothyroxine (SYNTHROID) tablet 100 mcg  100 mcg Oral Q0600 Sharma Covert, MD   100 mcg at 10/21/19 0650  . metoprolol succinate (TOPROL-XL) 24 hr tablet 100 mg  100 mg Oral Daily Clapacs, Madie Reno, MD   100 mg at 10/21/19 0803  . mometasone-formoterol (DULERA) 200-5 MCG/ACT inhaler 2 puff  2 puff Inhalation BID Caroline Sauger, NP   2 puff at 10/21/19 0805  . multivitamin (RENA-VIT) tablet 1 tablet  1 tablet Oral Daily Caroline Sauger, NP   1 tablet at 10/21/19 0805  . nicotine (NICODERM CQ - dosed in mg/24 hours) patch 21 mg  21 mg Transdermal Daily Clapacs, Madie Reno, MD   21 mg at 10/21/19 0806  . pantoprazole (PROTONIX) EC tablet 40 mg  40 mg Oral BID Clapacs, Madie Reno, MD   40 mg at 10/21/19 0803  . senna (SENOKOT) tablet 17.2 mg  2 tablet Oral Daily PRN Clapacs, Madie Reno, MD   17.2 mg at 10/16/19 1257  . temazepam (RESTORIL) capsule 15 mg  15 mg Oral QHS Clapacs, Madie Reno, MD   15 mg at 10/20/19 2126  . trihexyphenidyl (ARTANE) tablet 2 mg  2 mg Oral BID Clapacs, Madie Reno, MD   2 mg at 10/21/19 0900  . tuberculin injection 5 Units  5 Units Intradermal Once Clapacs, Madie Reno, MD   5 Units at 10/20/19 1348    Lab Results:  Results for orders placed or performed during the hospital encounter of 10/04/19 (from the past 48 hour(s))  CBC     Status:  Abnormal   Collection Time: 10/20/19  8:45 AM  Result Value Ref Range   WBC 6.1 4.0 - 10.5 K/uL   RBC 2.89 (L) 4.22 - 5.81 MIL/uL   Hemoglobin 9.5 (L) 13.0 - 17.0 g/dL   HCT 27.3 (L) 39.0 - 52.0 %   MCV 94.5 80.0 - 100.0 fL   MCH 32.9 26.0 - 34.0 pg   MCHC 34.8 30.0 - 36.0 g/dL   RDW 15.0 11.5 - 15.5 %   Platelets 180 150 - 400 K/uL   nRBC 0.0 0.0 - 0.2 %    Comment: Performed at [facility], [address] [place], [place] [address]  Renal function panel     Status: Abnormal   Collection Time: 10/20/19  8:45 AM  Result Value Ref Range   Sodium 136 135 - 145 mmol/L   Potassium 4.6 3.5 - 5.1 mmol/L   Chloride 96 (L) 98 - 111 mmol/L   CO2 24 22 - 32 mmol/L   Glucose, Bld 130 (H) 70 - 99 mg/dL    Comment: Glucose reference range applies only to samples taken after fasting for at least 8 hours.   BUN 91 (H) 6 - 20 mg/dL   Creatinine, Ser 8.58 (H) 0.61 - 1.24 mg/dL   Calcium 9.5 8.9 - 10.3 mg/dL   Phosphorus 3.2 2.5 - 4.6 mg/dL   Albumin 3.8 3.5 - 5.0 g/dL   GFR calc non Af Amer 6 (L) >60 mL/min   GFR calc Af Amer 7 (L) >60 mL/min   Anion gap 16 (H) 5 - 15    Comment: Performed at [facility], [address] [place], [place] [address]    Blood Alcohol level:  Lab Results  Component Value Date   ETH <10 10/04/2019   ETH <5 16/03/9603    Metabolic Disorder Labs: Lab Results  Component Value Date   HGBA1C 5.0 09/12/2018   MPG 97 09/12/2018   No results found for: PROLACTIN Lab Results  Component Value Date   CHOL 186 10/06/2019   TRIG 75 10/06/2019   HDL 77 10/06/2019   CHOLHDL 2.4 10/06/2019   VLDL 15 10/06/2019   LDLCALC 94 10/06/2019    Physical Findings: AIMS:  , ,  ,  ,    CIWA:    COWS:     Musculoskeletal: Strength & Muscle Tone: within normal limits Gait & Station: normal Patient leans: N/A  Psychiatric Specialty Exam: Physical Exam   Review of Systems   Blood pressure (!) 163/81, pulse (!) 59, temperature 97.8 F (36.6  C), temperature source Oral, resp. rate 20, height 6\' 3"  (1.905 m), weight 69.4 kg, SpO2 92 %.Body mass index is 19.12 kg/m.  General Appearance: Disheveled  Eye Contact:  Good  Speech:  Garbled  Volume:  Increased  Mood:  Euthymic  Affect:  Appropriate and Congruent  Thought Process:  Coherent  Orientation:  Full (Time, Place, and Person)  Thought Content:  Logical  Suicidal Thoughts:  No  Homicidal Thoughts:  No  Memory:  Immediate;   Fair Recent;   Fair Remote;   NA  Judgement:  Fair  Insight:  Shallow  Psychomotor Activity:  Normal  Concentration:  Concentration: Fair and Attention Span: Fair  Recall:  [employer] of Knowledge:  NA  Language:  Fair  Akathisia:  No  Handed:  Right  AIMS (if indicated):     Assets:  Desire for Improvement Housing Social Support  ADL's:  Impaired  Cognition:  Impaired,  Mild  Sleep:  Number of Hours: 6.5     Treatment Plan Summary: Daily contact with patient to assess and evaluate symptoms and progress in treatment and Medication management  59yo M with h/o Schizophrenia, who was admitted to [facility] unit due to reported agitation at outpatient dialysis facility. At the unit patient is calm, cooperative, compliant with medications and dialysis and does not demonstrate any unsafe, threatening or agitated behaviors. His recent agitated behavior might be related to some type of physical discomfort, especially taking in consideration the fact that the patient cannot always communicate his needs effectively due to TD and garbled speech. At there moment, patient appears to be stable and is likely at his baseline. His psych medications will be continued without changes today: Carbamazepine 1000mg  PO QAM for mood stabilization, Prolixin 5mg  PO BID for psychosis and Restoril 15mg  PO QHS for sleep.  His BP continues to stay high, although improved since I saw this patient two weeks ago. He is currently on five antihypertensive medications. Patient has an ACT  team. He likely will be transfer to residential facility on Tuesday.  Larita Fife, MD 10/21/2019, 12:30 PM

## 2019-10-21 NOTE — Progress Notes (Signed)
Patient alert and oriented X 4, affectis flat but he brighten upon approach, he denies SI/HI/AVH he was ambulates on the hallway no distress noted no SOB on exertion. Patient iscomplaint with medication regimen, he denies  generalized pain and ws medicated as ordered. Patient is scheduled for dialysis Friday, 15 minutes safety checks maintained.

## 2019-10-21 NOTE — Progress Notes (Signed)
D: Pt denies SI/HI/AV hallucinations. Pt is pleasant and cooperative. Pt is out in milieu for a short while.  A: Pt was offered support and encouragement. Pt was given scheduled medications. Pt was encourage to attend groups. Q 15 minute checks were done for safety.  R: Pt is taking medication. Pt has no complaints.Pt receptive to treatment and safety maintained on unit.

## 2019-10-21 NOTE — Progress Notes (Signed)
Todd Brown  MRN: [id number]  DOB/AGE: 59-Sep-1962 59 y.o.  Primary Care Physician:System, Pcp Not In  Admit date: 10/04/2019  Chief Complaint:  Chief Complaint  Patient presents with  . Paranoid    S-Pt presented on  10/04/2019 with  Chief Complaint  Patient presents with  . Paranoid   Patient seen on the behavioral health ward Patient resting comfortably watching TV voices no new concerns  Medications   . amLODipine  10 mg Oral Daily  . calcium acetate  1,334 mg Oral TID WC  . carbamazepine  1,000 mg Oral [id number]  . carvedilol  25 mg Oral BID WC  . Chlorhexidine Gluconate Cloth  6 each Topical Q0600  . cholecalciferol  1,000 Units Oral Daily  . cloNIDine  0.3 mg Oral TID  . docusate sodium  100 mg Oral BID  . ferrous sulfate  325 mg Oral Daily  . fluPHENAZine  5 mg Oral BID  . hydrALAZINE  100 mg Oral Q8H  . levothyroxine  100 mcg Oral Q0600  . metoprolol succinate  100 mg Oral Daily  . mometasone-formoterol  2 puff Inhalation BID  . multivitamin  1 tablet Oral Daily  . nicotine  21 mg Transdermal Daily  . pantoprazole  40 mg Oral BID  . temazepam  15 mg Oral QHS  . trihexyphenidyl  2 mg Oral BID  . tuberculin  5 Units Intradermal Once         [id number] from the symptoms mentioned above,there are no other symptoms referable to all systems reviewed.  Physical Exam: Vital signs in last 24 hours: Temp:  [97.8 F (36.6 C)] 97.8 F (36.6 C) (05/22 0647) Pulse Rate:  [61-91] 82 (05/22 0647) Resp:  [11-20] 15 (05/21 1200) BP: (162-205)/(75-110) 162/75 (05/22 0647) SpO2:  [92 %] 92 % (05/22 0647) Weight change:  Last BM Date: 10/13/19  Intake/Output from previous day: 05/21 0701 - 05/22 0700 In: -  Out: 3500  No intake/output data recorded.   Physical Exam: General- pt is awake,alert, oriented to time place and person Resp- No acute REsp distress, CTA B/L NO Rhonchi CVS- S1S2 regular in rate and rhythm GIT- BS+, soft, NT, ND EXT- NO LE Edema,  Cyanosis Access left aVF AV fistula positive bruit and thrill  Lab Results: CBC Recent Labs    10/20/19 0845  WBC 6.1  HGB 9.5*  HCT 27.3*  PLT 180    Patient last hemoglobin was 10.0 on May 9  BMET Recent Labs    10/20/19 0845  NA 136  K 4.6  CL 96*  CO2 24  GLUCOSE 130*  BUN 91*  CREATININE 8.58*  CALCIUM 9.5    Patient last Chem-7 done on May 9 shows sodium 140 Potassium 6.1 Bicarb 29 Calcium 9.3 Phosphorus was 5.2 on May 7  MICRO No results found for this or any previous visit (from the past 240 hour(s)).    Lab Results  Component Value Date   PTH 194 (H) 05/11/2017   CALCIUM 9.5 10/20/2019   PHOS 3.2 10/20/2019               Impression:    Patient is a 59 year old African-American male with a past medical history of end-stage renal disease on hemodialysis-Monday Wednesday Friday schedule, hypertension, secondary hyperparathyroidism, schizophrenia, hepatitis C, history of DVT, history of anemia of chronic disease who was admitted with schizophrenia and noncompliance with dialysis treatment  1)Renal   ESRD on hemodialysis Patient is on Monday Wednesday Friday schedule.  As an outpatient patient is under care for [facility] and get dialyzed at [facility] on [address] on Monday Wednesday Friday schedule.Patient was last dialyzed yesterday No need for dialysis today  2)HTN blood pressure is much better than before We will continue to monitor patient blood pressure   3)Anemia of chronic disease  HGb at goal (9--11) Patient is on Epogen protocol  4) secondary hyperparathyroidism -CKD Mineral-Bone Disorder   Secondary Hyperparathyroidism  present .  Phosphorus is at goal   5) schizophrenia Patient being followed closely by psychiatry  6) electrolytes   sodium Normonatremic   potassium Normokalemic    7)Acid base Co2 at goal     Plan:   No need for dialysis today       Ivin Rosenbloom s [facility] 10/21/2019, 9:52 AM

## 2019-10-21 NOTE — BHH Group Notes (Signed)
[place] LCSW Group Therapy Note  Date/Time:  10/21/2019  1:00PM  Type of Therapy and Topic:  Group Therapy:  Healthy and Unhealthy Supports  Participation Level:  Did Not Attend   Description of Group:  Patients in this group were introduced to the idea of adding a variety of healthy supports to address the various needs in their lives.Patients discussed what additional healthy supports could be helpful in their recovery and wellness after discharge in order to prevent future hospitalizations.   An emphasis was placed on using counselor, doctor, therapy groups, 12-step groups, and problem-specific support groups to expand supports.  They also worked as a group on developing a specific plan for several patients to deal with unhealthy supports through [place], psychoeducation with loved ones, and even termination of relationships.   Therapeutic Goals:   1)  discuss importance of adding supports to stay well once out of the hospital  2)  compare healthy versus unhealthy supports and identify some examples of each  3)  generate ideas and descriptions of healthy supports that can be added  4)  offer mutual support about how to address unhealthy supports  5)  encourage active participation in and adherence to discharge plan    Summary of Patient Progress:   Patient did not attend group.   Therapeutic Modalities:   Motivational Interviewing Brief Solution-Focused Therapy   Netta Neat, MSW, LCSW

## 2019-10-22 MED ORDER — ALBUTEROL SULFATE (2.5 MG/3ML) 0.083% IN NEBU
2.5000 mg | INHALATION_SOLUTION | Freq: Four times a day (QID) | RESPIRATORY_TRACT | Status: DC
  Administered 2019-10-23: 2.5 mg via RESPIRATORY_TRACT
  Filled 2019-10-22: qty 3

## 2019-10-22 NOTE — Progress Notes (Signed)
[facility] MD Progress Note  10/22/2019 9:13 AM GRAYDON FOFANA  MRN:  [id number]  Mr. Shaneyfelt is a 59yo M with h/o Schizophrenia, who was admitted to [facility] unit due to agitation.  Patient seen.  Chart reviewed. Patient discussed with nursing; no overnight events reported. Last BP was 180/69mmHg.  Subjective:  Patient reports "I feel fine". He smiles and denies any complaints today - denies feeling depressed, anxious, suicidal, homicidal, denies paranoid thoughts, denies hallucinations. Patient denies any physical complaints also. He knows his next dialysis session is tomorrow. He is compliant with all medications and denies side effects from them. He is hopeful for transfer to a residential facility next week.  Principal Problem: Schizophrenia ([place]) Diagnosis: Principal Problem:   Schizophrenia ([place]) Active Problems:   Essential hypertension, benign   Chronic kidney disease   Hepatitis C   End stage renal disease ([place])   Dialysis patient ([place])  Total Time spent with patient: 15 minutes  Past Psychiatric History: see H&P  Past Medical History:  Past Medical History:  Diagnosis Date  . Anemia   . Diabetes mellitus without complication ([place])   . Dialysis patient [facility])    Mon. -Wed.- Fri  . DVT (deep venous thrombosis) ([place])    cephalic and basolic vein thrombosis  . ESRD (end stage renal disease) ([place])   . Hyperlipidemia   . Malignant hypertension   . Renal artery stenosis ([place])   . Schizophrenia [facility])     Past Surgical History:  Procedure Laterality Date  . A/V FISTULAGRAM N/A 08/06/2016   Procedure: A/V Fistulagram;  Surgeon: Algernon Huxley, MD;  Location: [place] CV LAB;  Service: Cardiovascular;  Laterality: N/A;  . A/V SHUNT INTERVENTION N/A 08/06/2016   Procedure: A/V Shunt Intervention;  Surgeon: Algernon Huxley, MD;  Location: [place] CV LAB;  Service: Cardiovascular;  Laterality: N/A;  . AV FISTULA PLACEMENT Left 12/26/2014   Procedure: ARTERIOVENOUS (AV) FISTULA CREATION;   Surgeon: Algernon Huxley, MD;  Location: [facility] ORS;  Service: Vascular;  Laterality: Left;  . ESOPHAGOGASTRODUODENOSCOPY (EGD) WITH PROPOFOL N/A 05/06/2017   Procedure: ESOPHAGOGASTRODUODENOSCOPY (EGD) WITH PROPOFOL;  Surgeon: Jonathon Bellows, MD;  Location: [facility] ENDOSCOPY;  Service: Gastroenterology;  Laterality: N/A;  . INSERTION OF DIALYSIS CATHETER Right   . PERIPHERAL VASCULAR CATHETERIZATION N/A 10/22/2014   Procedure: Dialysis/Perma Catheter Insertion;  Surgeon: Algernon Huxley, MD;  Location: [place] CV LAB;  Service: Cardiovascular;  Laterality: N/A;  . PERIPHERAL VASCULAR CATHETERIZATION N/A 02/28/2015   Procedure: Dialysis/Perma Catheter Removal;  Surgeon: Algernon Huxley, MD;  Location: [place] CV LAB;  Service: Cardiovascular;  Laterality: N/A;  . Repair fx left lower leg     yrs ago (age 51)   Family History:  Family History  Problem Relation Age of Onset  . Diabetes Neg Hx    Family Psychiatric  History: see H&P Social History:  Social History   Substance and Sexual Activity  Alcohol Use No     Social History   Substance and Sexual Activity  Drug Use Yes  . Frequency: 1.0 times per week  . Types: Marijuana    Social History   Socioeconomic History  . Marital status: Single    Spouse name: Not on file  . Number of children: Not on file  . Years of education: Not on file  . Highest education level: Not on file  Occupational History  . Occupation: Disability   Tobacco Use  . Smoking status: Current Every Day Smoker  Packs/day: 0.50    Types: Cigarettes  . Smokeless tobacco: Never Used  . Tobacco comment: 10 cigars a day  Substance and Sexual Activity  . Alcohol use: No  . Drug use: Yes    Frequency: 1.0 times per week    Types: Marijuana  . Sexual activity: Not Currently  Other Topics Concern  . Not on file  Social History Narrative  . Not on file   Social Determinants of Health   Financial Resource Strain:   . Difficulty of Paying Living Expenses:    Food Insecurity:   . Worried About Charity fundraiser in the Last Year:   . Arboriculturist in the Last Year:   Transportation Needs:   . Film/video editor (Medical):   Marland Kitchen Lack of Transportation (Non-Medical):   Physical Activity:   . Days of Exercise per Week:   . Minutes of Exercise per Session:   Stress:   . Feeling of Stress :   Social Connections:   . Frequency of Communication with Friends and Family:   . Frequency of Social Gatherings with Friends and Family:   . Attends Religious Services:   . Active Member of Clubs or Organizations:   . Attends Archivist Meetings:   Marland Kitchen Marital Status:    Additional Social History:                         Sleep: Fair  Appetite:  Good  Current Medications: Current Facility-Administered Medications  Medication Dose Route Frequency Provider Last Rate Last Admin  . acetaminophen (TYLENOL) tablet 500 mg  500 mg Oral Q4H PRN Caroline Sauger, NP   500 mg at 10/22/19 0414  . albuterol (VENTOLIN HFA) 108 (90 Base) MCG/ACT inhaler 1-2 puff  1-2 puff Inhalation Q6H PRN Sharma Covert, MD   2 puff at 10/21/19 2132  . amLODipine (NORVASC) tablet 10 mg  10 mg Oral Daily Clapacs, Madie Reno, MD   10 mg at 10/22/19 0806  . calcium acetate (PHOSLO) capsule 1,334 mg  1,334 mg Oral TID WC Caroline Sauger, NP   1,334 mg at 10/22/19 0805  . carbamazepine (TEGRETOL XR) 12 hr tablet 1,000 mg  1,000 mg Oral Chester Holstein, Geni Bers, NP   1,000 mg at 10/22/19 5170  . carvedilol (COREG) tablet 25 mg  25 mg Oral BID WC Clapacs, Madie Reno, MD   25 mg at 10/22/19 0806  . Chlorhexidine Gluconate Cloth 2 % PADS 6 each  6 each Topical Q0600 Murlean Iba, MD   6 each at 10/17/19 [phone]  . cholecalciferol (VITAMIN D3) tablet 1,000 Units  1,000 Units Oral Daily Caroline Sauger, NP   1,000 Units at 10/22/19 0805  . cloNIDine (CATAPRES) tablet 0.3 mg  0.3 mg Oral TID Larita Fife, MD   0.3 mg at 10/22/19 0806  . docusate sodium  (COLACE) capsule 100 mg  100 mg Oral BID Clapacs, Madie Reno, MD   100 mg at 10/22/19 0806  . ferrous sulfate tablet 325 mg  325 mg Oral Daily Caroline Sauger, NP   325 mg at 10/22/19 0805  . fluPHENAZine (PROLIXIN) tablet 5 mg  5 mg Oral BID Sharma Covert, MD   5 mg at 10/22/19 9449  . heparin injection 1,400 Units  20 Units/kg Dialysis PRN Murlean Iba, MD      . hydrALAZINE (APRESOLINE) tablet 100 mg  100 mg Oral Q8H Clary, Cordie Grice, MD   100 mg  at 10/22/19 0624  . hydrOXYzine (ATARAX/VISTARIL) tablet 50 mg  50 mg Oral Q6H PRN Clapacs, Madie Reno, MD   50 mg at 10/19/19 2218  . levothyroxine (SYNTHROID) tablet 100 mcg  100 mcg Oral Q0600 Sharma Covert, MD   100 mcg at 10/22/19 2035  . metoprolol succinate (TOPROL-XL) 24 hr tablet 100 mg  100 mg Oral Daily Clapacs, Madie Reno, MD   100 mg at 10/22/19 0805  . mometasone-formoterol (DULERA) 200-5 MCG/ACT inhaler 2 puff  2 puff Inhalation BID Caroline Sauger, NP   2 puff at 10/22/19 [phone]  . multivitamin (RENA-VIT) tablet 1 tablet  1 tablet Oral Daily Caroline Sauger, NP   1 tablet at 10/22/19 0807  . nicotine (NICODERM CQ - dosed in mg/24 hours) patch 21 mg  21 mg Transdermal Daily Clapacs, Madie Reno, MD   21 mg at 10/22/19 1638  . pantoprazole (PROTONIX) EC tablet 40 mg  40 mg Oral BID Clapacs, Madie Reno, MD   40 mg at 10/22/19 0806  . senna (SENOKOT) tablet 17.2 mg  2 tablet Oral Daily PRN Clapacs, Madie Reno, MD   17.2 mg at 10/16/19 1257  . temazepam (RESTORIL) capsule 15 mg  15 mg Oral QHS Clapacs, Madie Reno, MD   15 mg at 10/21/19 2135  . trihexyphenidyl (ARTANE) tablet 2 mg  2 mg Oral BID Clapacs, Madie Reno, MD   2 mg at 10/22/19 0807  . tuberculin injection 5 Units  5 Units Intradermal Once Clapacs, Madie Reno, MD   5 Units at 10/20/19 1348    Lab Results:  No results found for this or any previous visit (from the past 48 hour(s)).  Blood Alcohol level:  Lab Results  Component Value Date   ETH <10 10/04/2019   ETH <5 45/36/4680     Metabolic Disorder Labs: Lab Results  Component Value Date   HGBA1C 5.0 09/12/2018   MPG 97 09/12/2018   No results found for: PROLACTIN Lab Results  Component Value Date   CHOL 186 10/06/2019   TRIG 75 10/06/2019   HDL 77 10/06/2019   CHOLHDL 2.4 10/06/2019   VLDL 15 10/06/2019   LDLCALC 94 10/06/2019    Physical Findings: AIMS:  , ,  ,  ,    CIWA:    COWS:     Musculoskeletal: Strength & Muscle Tone: within normal limits Gait & Station: normal Patient leans: N/A  Psychiatric Specialty Exam: Physical Exam   Review of Systems   Blood pressure (!) 180/82, pulse 64, temperature 98.3 F (36.8 C), temperature source Oral, resp. rate 18, height 6\' 3"  (1.905 m), weight 69.4 kg, SpO2 94 %.Body mass index is 19.12 kg/m.  General Appearance: Disheveled  Eye Contact:  Good  Speech:  Garbled  Volume:  Increased  Mood:  Euthymic  Affect:  Appropriate and Congruent  Thought Process:  Coherent  Orientation:  Full (Time, Place, and Person)  Thought Content:  Logical  Suicidal Thoughts:  No  Homicidal Thoughts:  No  Memory:  Immediate;   Fair Recent;   Fair Remote;   NA  Judgement:  Fair  Insight:  Shallow  Psychomotor Activity:  Normal  Concentration:  Concentration: Fair and Attention Span: Fair  Recall:  [employer] of Knowledge:  NA  Language:  Fair  Akathisia:  No  Handed:  Right  AIMS (if indicated):     Assets:  Desire for Improvement Housing Social Support  ADL's:  Impaired  Cognition:  Impaired,  Mild  Sleep:  Number of Hours: 6.5     Treatment Plan Summary: Daily contact with patient to assess and evaluate symptoms and progress in treatment and Medication management  59yo M with h/o Schizophrenia, who was admitted to [facility] unit due to reported agitation at outpatient dialysis facility. At the unit patient is calm, cooperative, compliant with medications and dialysis and does not demonstrate any unsafe, threatening or agitated behaviors. His recent  agitated behavior might be related to some type of physical discomfort, especially taking in consideration the fact that the patient cannot always communicate his needs effectively.  At the moment, patient appears to be stable and is likely at his mental baseline. His psych medications will be continued without changes today: Carbamazepine 1000mg  PO QAM for mood stabilization, Prolixin 5mg  PO BID for psychosis and Restoril 15mg  PO QHS for sleep.  His BP continues to stay high and he is currently on five antihypertensive medications. Patient has an ACT team. He likely will be transfered to a residential facility on Tuesday.  Larita Fife, MD 10/22/2019, 9:13 AM

## 2019-10-22 NOTE — Progress Notes (Signed)
D: Pt denies SI/HI/AV hallucinations. Pt is pleasant and cooperative. Pt has been observed in his room most of the day. Patient voices no concerns.  A: Pt was offered support and encouragement. Pt was given scheduled medications. Pt was encourage to attend groups. Q 15 minute checks were done for safety.  R:Patient taking medications as prescribed. Pt has no complaints.Pt receptive to treatment and safety maintained on unit.

## 2019-10-22 NOTE — Plan of Care (Signed)
Cooperative with treatment, medication compliant. In bed resting at this time with no complaints to report on shift at this time.

## 2019-10-23 ENCOUNTER — Encounter: Payer: Self-pay | Admitting: Family Medicine

## 2019-10-23 ENCOUNTER — Observation Stay
Admission: AD | Admit: 2019-10-23 | Discharge: 2019-10-24 | Disposition: A | Discharge status: Home / Self Care | Payer: Medicaid Other | Attending: Internal Medicine | Admitting: Internal Medicine

## 2019-10-23 ENCOUNTER — Observation Stay: Payer: Medicaid Other

## 2019-10-23 ENCOUNTER — Other Ambulatory Visit: Payer: Self-pay

## 2019-10-23 DIAGNOSIS — Z7951 Long term (current) use of inhaled steroids: Secondary | ICD-10-CM | POA: Insufficient documentation

## 2019-10-23 DIAGNOSIS — F17210 Nicotine dependence, cigarettes, uncomplicated: Secondary | ICD-10-CM | POA: Insufficient documentation

## 2019-10-23 DIAGNOSIS — J810 Acute pulmonary edema: Secondary | ICD-10-CM

## 2019-10-23 DIAGNOSIS — Z992 Dependence on renal dialysis: Secondary | ICD-10-CM | POA: Diagnosis present

## 2019-10-23 DIAGNOSIS — B1920 Unspecified viral hepatitis C without hepatic coma: Secondary | ICD-10-CM | POA: Insufficient documentation

## 2019-10-23 DIAGNOSIS — F209 Schizophrenia, unspecified: Secondary | ICD-10-CM | POA: Diagnosis present

## 2019-10-23 DIAGNOSIS — F203 Undifferentiated schizophrenia: Secondary | ICD-10-CM

## 2019-10-23 DIAGNOSIS — J45909 Unspecified asthma, uncomplicated: Secondary | ICD-10-CM | POA: Insufficient documentation

## 2019-10-23 DIAGNOSIS — R0902 Hypoxemia: Secondary | ICD-10-CM

## 2019-10-23 DIAGNOSIS — J9601 Acute respiratory failure with hypoxia: Secondary | ICD-10-CM | POA: Diagnosis not present

## 2019-10-23 DIAGNOSIS — D638 Anemia in other chronic diseases classified elsewhere: Secondary | ICD-10-CM

## 2019-10-23 DIAGNOSIS — Z79899 Other long term (current) drug therapy: Secondary | ICD-10-CM | POA: Insufficient documentation

## 2019-10-23 DIAGNOSIS — I1311 Hypertensive heart and chronic kidney disease without heart failure, with stage 5 chronic kidney disease, or end stage renal disease: Secondary | ICD-10-CM | POA: Insufficient documentation

## 2019-10-23 DIAGNOSIS — I10 Essential (primary) hypertension: Secondary | ICD-10-CM | POA: Diagnosis present

## 2019-10-23 DIAGNOSIS — N186 End stage renal disease: Secondary | ICD-10-CM | POA: Diagnosis not present

## 2019-10-23 DIAGNOSIS — E1122 Type 2 diabetes mellitus with diabetic chronic kidney disease: Secondary | ICD-10-CM | POA: Insufficient documentation

## 2019-10-23 DIAGNOSIS — E875 Hyperkalemia: Secondary | ICD-10-CM | POA: Insufficient documentation

## 2019-10-23 DIAGNOSIS — F419 Anxiety disorder, unspecified: Secondary | ICD-10-CM | POA: Insufficient documentation

## 2019-10-23 DIAGNOSIS — E785 Hyperlipidemia, unspecified: Secondary | ICD-10-CM | POA: Insufficient documentation

## 2019-10-23 DIAGNOSIS — Z791 Long term (current) use of non-steroidal anti-inflammatories (NSAID): Secondary | ICD-10-CM | POA: Insufficient documentation

## 2019-10-23 DIAGNOSIS — Z20822 Contact with and (suspected) exposure to covid-19: Secondary | ICD-10-CM | POA: Insufficient documentation

## 2019-10-23 DIAGNOSIS — Z86718 Personal history of other venous thrombosis and embolism: Secondary | ICD-10-CM | POA: Insufficient documentation

## 2019-10-23 DIAGNOSIS — F329 Major depressive disorder, single episode, unspecified: Secondary | ICD-10-CM | POA: Insufficient documentation

## 2019-10-23 LAB — BASIC METABOLIC PANEL
Anion gap: 16 — ABNORMAL HIGH (ref 5–15)
BUN: 101 mg/dL — ABNORMAL HIGH (ref 6–20)
CO2: 23 mmol/L (ref 22–32)
Calcium: 9.4 mg/dL (ref 8.9–10.3)
Chloride: 93 mmol/L — ABNORMAL LOW (ref 98–111)
Creatinine, Ser: 8.95 mg/dL — ABNORMAL HIGH (ref 0.61–1.24)
GFR calc Af Amer: 7 mL/min — ABNORMAL LOW (ref >60)
GFR calc non Af Amer: 6 mL/min — ABNORMAL LOW (ref >60)
Glucose, Bld: 105 mg/dL — ABNORMAL HIGH (ref 70–99)
Potassium: 5.7 mmol/L — ABNORMAL HIGH (ref 3.5–5.1)
Sodium: 132 mmol/L — ABNORMAL LOW (ref 135–145)

## 2019-10-23 LAB — SARS CORONAVIRUS 2 BY RT PCR (HOSPITAL ORDER, PERFORMED IN ~~LOC~~ HOSPITAL LAB): SARS Coronavirus 2: NEGATIVE

## 2019-10-23 LAB — CBC
HCT: 26.3 % — ABNORMAL LOW (ref 39.0–52.0)
Hemoglobin: 9.1 g/dL — ABNORMAL LOW (ref 13.0–17.0)
MCH: 33.2 pg (ref 26.0–34.0)
MCHC: 34.6 g/dL (ref 30.0–36.0)
MCV: 96 fL (ref 80.0–100.0)
Platelets: 187 10*3/uL (ref 150–400)
RBC: 2.74 MIL/uL — ABNORMAL LOW (ref 4.22–5.81)
RDW: 14.6 % (ref 11.5–15.5)
WBC: 5.7 10*3/uL (ref 4.0–10.5)
nRBC: 0 % (ref 0.0–0.2)

## 2019-10-23 LAB — HIV ANTIBODY (ROUTINE TESTING W REFLEX): HIV Screen 4th Generation wRfx: NONREACTIVE

## 2019-10-23 LAB — MAGNESIUM: Magnesium: 2.1 mg/dL (ref 1.7–2.4)

## 2019-10-23 MED ORDER — PANTOPRAZOLE SODIUM 40 MG PO TBEC
40.0000 mg | DELAYED_RELEASE_TABLET | Freq: Every day | ORAL | Status: DC
  Administered 2019-10-23 – 2019-10-24 (×2): 40 mg via ORAL
  Filled 2019-10-23 (×2): qty 1

## 2019-10-23 MED ORDER — HYDRALAZINE HCL 20 MG/ML IJ SOLN
10.0000 mg | Freq: Four times a day (QID) | INTRAMUSCULAR | Status: DC | PRN
  Administered 2019-10-23: 10 mg via INTRAVENOUS
  Filled 2019-10-23 (×2): qty 0.5

## 2019-10-23 MED ORDER — AMLODIPINE BESYLATE 10 MG PO TABS
10.0000 mg | ORAL_TABLET | Freq: Every day | ORAL | Status: DC
  Administered 2019-10-23: 10 mg via ORAL
  Filled 2019-10-23: qty 1

## 2019-10-23 MED ORDER — CARBAMAZEPINE ER 200 MG PO TB12
1000.0000 mg | ORAL_TABLET | ORAL | Status: DC
  Administered 2019-10-23: 1000 mg via ORAL
  Filled 2019-10-23 (×3): qty 5

## 2019-10-23 MED ORDER — AMLODIPINE BESYLATE 10 MG PO TABS
10.0000 mg | ORAL_TABLET | Freq: Every evening | ORAL | Status: DC
  Administered 2019-10-23: 10 mg via ORAL
  Filled 2019-10-23: qty 1

## 2019-10-23 MED ORDER — HYDRALAZINE HCL 20 MG/ML IJ SOLN
10.0000 mg | Freq: Once | INTRAMUSCULAR | Status: DC
  Filled 2019-10-23: qty 0.5

## 2019-10-23 MED ORDER — ACETAMINOPHEN 500 MG PO TABS
500.0000 mg | ORAL_TABLET | ORAL | Status: DC | PRN
  Administered 2019-10-23: 500 mg via ORAL
  Filled 2019-10-23: qty 1

## 2019-10-23 MED ORDER — CLONIDINE HCL 0.1 MG PO TABS
0.3000 mg | ORAL_TABLET | Freq: Three times a day (TID) | ORAL | Status: DC
  Administered 2019-10-23 – 2019-10-24 (×3): 0.3 mg via ORAL
  Filled 2019-10-23 (×4): qty 3

## 2019-10-23 MED ORDER — MAGNESIUM SULFATE 2 GM/50ML IV SOLN
2.0000 g | Freq: Once | INTRAVENOUS | Status: AC
  Administered 2019-10-23: 2 g via INTRAVENOUS
  Filled 2019-10-23: qty 50

## 2019-10-23 MED ORDER — ALBUTEROL SULFATE HFA 108 (90 BASE) MCG/ACT IN AERS
1.0000 | INHALATION_SPRAY | RESPIRATORY_TRACT | Status: DC
  Administered 2019-10-23: 1 via RESPIRATORY_TRACT
  Filled 2019-10-23: qty 6.7

## 2019-10-23 MED ORDER — ENOXAPARIN SODIUM 40 MG/0.4ML ~~LOC~~ SOLN: 40.0000 mg | SUBCUTANEOUS | Status: DC

## 2019-10-23 MED ORDER — ALBUTEROL SULFATE HFA 108 (90 BASE) MCG/ACT IN AERS: 1.0000 | INHALATION_SPRAY | RESPIRATORY_TRACT | Status: DC | PRN

## 2019-10-23 MED ORDER — FERROUS SULFATE 325 (65 FE) MG PO TABS
325.0000 mg | ORAL_TABLET | Freq: Every day | ORAL | Status: DC
  Administered 2019-10-23 – 2019-10-24 (×2): 325 mg via ORAL
  Filled 2019-10-23 (×2): qty 1

## 2019-10-23 MED ORDER — HEPARIN SODIUM (PORCINE) 5000 UNIT/ML IJ SOLN
5000.0000 [IU] | Freq: Three times a day (TID) | INTRAMUSCULAR | Status: DC
  Administered 2019-10-23 – 2019-10-24 (×4): 5000 [IU] via SUBCUTANEOUS
  Filled 2019-10-23 (×4): qty 1

## 2019-10-23 MED ORDER — ALBUTEROL SULFATE (2.5 MG/3ML) 0.083% IN NEBU: 2.5000 mg | INHALATION_SOLUTION | Freq: Four times a day (QID) | RESPIRATORY_TRACT | Status: DC | PRN

## 2019-10-23 MED ORDER — CELECOXIB 200 MG PO CAPS: 200.0000 mg | ORAL_CAPSULE | Freq: Every day | ORAL | Status: DC | PRN

## 2019-10-23 MED ORDER — CALCIUM ACETATE (PHOS BINDER) 667 MG PO CAPS
1334.0000 mg | ORAL_CAPSULE | Freq: Three times a day (TID) | ORAL | Status: DC
  Administered 2019-10-23 – 2019-10-24 (×4): 1334 mg via ORAL
  Filled 2019-10-23 (×6): qty 2

## 2019-10-23 MED ORDER — RENA-VITE PO TABS
1.0000 | ORAL_TABLET | Freq: Every day | ORAL | Status: DC
  Administered 2019-10-23 – 2019-10-24 (×2): 1 via ORAL
  Filled 2019-10-23 (×2): qty 1

## 2019-10-23 MED ORDER — HYDRALAZINE HCL 50 MG PO TABS
100.0000 mg | ORAL_TABLET | Freq: Two times a day (BID) | ORAL | Status: DC
  Administered 2019-10-23 – 2019-10-24 (×3): 100 mg via ORAL
  Filled 2019-10-23 (×3): qty 2

## 2019-10-23 MED ORDER — GUAIFENESIN-CODEINE 100-10 MG/5ML PO SOLN
10.0000 mL | ORAL | Status: DC | PRN
  Administered 2019-10-23: 10 mL via ORAL
  Filled 2019-10-23: qty 10

## 2019-10-23 MED ORDER — ALBUTEROL SULFATE HFA 108 (90 BASE) MCG/ACT IN AERS: 1.0000 | INHALATION_SPRAY | Freq: Four times a day (QID) | RESPIRATORY_TRACT | Status: DC | PRN

## 2019-10-23 NOTE — Progress Notes (Signed)
HD ended 

## 2019-10-23 NOTE — Progress Notes (Signed)
HD started. 

## 2019-10-23 NOTE — BHH Counselor (Signed)
CSW followed up with Allene Pyo about potential group home placement. CSW informed her the pt has medical needs that are being addressed and unsure if he will be ready to be discharged on 5/25 to the group home as planned. Ms. Oswaldo Milian reports she will hold the bed for the patient.

## 2019-10-23 NOTE — Progress Notes (Addendum)
Patient walked to nursing station c/o SOB on exertion and was holding on to his chest, patient was assisted to a chair vs revealed increased blood pressure, 02 saturation 74% o RA crackles noted in lower lobes, he was given albuterol inhaler 2 puffs standing odder PRN THE SOB was persistent no relieve, writer decided to call NP on call she ordered albuterol nebulizer  Treatment Rrapid  Response called will continue to monitor closely.

## 2019-10-23 NOTE — H&P (Addendum)
History and Physical    Todd Brown XFG:[id number] DOB: [birth year] DOA: (Not on file)  PCP: System, Pcp Not In  Patient coming from: Behavior health unit  I have personally briefly reviewed patient's old medical records in [place]  Chief Complaint: acute respiratory distress  HPI: Todd Brown is a 59 y.o. male with medical history significant for schizophrenia, ESRD on dialysis MWF, hypertension, asthma, hepatitis C, history of acute pulmonary edema who is being admitted from behavioral health unit for concerns of acute respiratory distress.  Patient was initially admitted on 5/5 by psychiatry for schizophrenia and increased agitation.  He has been followed by nephrology during this hospitalization for dialysis.  Hospitalist is being consulted by psychiatry NP for rapid response called for acute respiratory distress.  Patient is a difficult historian due to his garbled speech which appears to be his baseline.  Per his primary nurse, patient was doing well tonight when he suddenly developed acute persistent coughing and complaint of shortness of breath.  He was initially noted to be hypoxic down in the 70s on room air, hypertensive and also appeared anxious.  She also notes that he had wheezing prior to receiving albuterol nebulizer treatment with some improvement. He denies any chest pain.  Only notes lower back pain.  Denies any new lower extremity edema.  Patient will be admitted to hospitalist service since he will need a higher acuity of care than could be offered down in the behavioral health unit.  Psychiatry and nephrology will continue to follow.  Patient can likely be transferred back to psychiatry once he is medically stable after observation.    Review of Systems:  Constitutional: No Weight Change, No Fever ENT/Mouth: No sore throat, No Rhinorrhea Eyes: No Eye Pain, No Vision Changes Cardiovascular: No Chest Pain, + SOB, No Edema, No Palpitations Respiratory:  + Cough, No Sputum, + Wheezing, + Dyspnea  Gastrointestinal: No Nausea, No Vomiting, No Diarrhea, No Constipation, No Pain Genitourinary: no Urinary Incontinence Musculoskeletal: + Arthralgias, No Myalgias Skin: No Skin Lesions, No Pruritus, Neuro: no Weakness, No Numbness Psych: No Anxiety/Panic, No Depression, no decrease appetite Heme/Lymph: No Bruising, No Bleeding  Past Medical History:  Diagnosis Date  . Anemia   . Diabetes mellitus without complication ([place])   . Dialysis patient [facility])    Mon. -Wed.- Fri  . DVT (deep venous thrombosis) ([place])    cephalic and basolic vein thrombosis  . ESRD (end stage renal disease) ([place])   . Hyperlipidemia   . Malignant hypertension   . Renal artery stenosis ([place])   . Schizophrenia [facility])     Past Surgical History:  Procedure Laterality Date  . A/V FISTULAGRAM N/A 08/06/2016   Procedure: A/V Fistulagram;  Surgeon: Algernon Huxley, MD;  Location: [place] CV LAB;  Service: Cardiovascular;  Laterality: N/A;  . A/V SHUNT INTERVENTION N/A 08/06/2016   Procedure: A/V Shunt Intervention;  Surgeon: Algernon Huxley, MD;  Location: [place] CV LAB;  Service: Cardiovascular;  Laterality: N/A;  . AV FISTULA PLACEMENT Left 12/26/2014   Procedure: ARTERIOVENOUS (AV) FISTULA CREATION;  Surgeon: Algernon Huxley, MD;  Location: [facility] ORS;  Service: Vascular;  Laterality: Left;  . ESOPHAGOGASTRODUODENOSCOPY (EGD) WITH PROPOFOL N/A 05/06/2017   Procedure: ESOPHAGOGASTRODUODENOSCOPY (EGD) WITH PROPOFOL;  Surgeon: Jonathon Bellows, MD;  Location: [facility] ENDOSCOPY;  Service: Gastroenterology;  Laterality: N/A;  . INSERTION OF DIALYSIS CATHETER Right   . PERIPHERAL VASCULAR CATHETERIZATION N/A 10/22/2014   Procedure: Dialysis/Perma Catheter Insertion;  Surgeon: Corene Cornea  Bunnie Domino, MD;  Location: [place] CV LAB;  Service: Cardiovascular;  Laterality: N/A;  . PERIPHERAL VASCULAR CATHETERIZATION N/A 02/28/2015   Procedure: Dialysis/Perma Catheter Removal;  Surgeon: Algernon Huxley, MD;   Location: [place] CV LAB;  Service: Cardiovascular;  Laterality: N/A;  . Repair fx left lower leg     yrs ago (age 73)     reports that he has been smoking cigarettes. He has been smoking about 0.50 packs per day. He has never used smokeless tobacco. He reports current drug use. Frequency: 1.00 time per week. Drug: Marijuana. He reports that he does not drink alcohol.  Allergies  Allergen Reactions  . Chlorpromazine Other (See Comments)    Reaction:  Unknown , pt states it makes him feel real bad Reaction:  Unknown , pt states it makes him feel real bad     Family History  Problem Relation Age of Onset  . Diabetes Neg Hx      Prior to Admission medications   Medication Sig Start Date End Date Taking? Authorizing Provider  acetaminophen (TYLENOL) 500 MG tablet Take 500 mg by mouth every 4 (four) hours as needed for mild pain or moderate pain.     [provider]  albuterol (PROAIR HFA) 108 (90 Base) MCG/ACT inhaler Inhale 1-2 puffs into the lungs every 4 (four) hours as needed for wheezing or shortness of breath.     [provider]  amLODipine (NORVASC) 10 MG tablet Take 1 tablet (10 mg total) by mouth daily. 09/13/18   Bettey Costa, MD  calcium acetate (PHOSLO) 667 MG capsule Take 2 capsules (1,334 mg total) by mouth 3 (three) times daily with meals. 05/19/15   Fritzi Mandes, MD  carbamazepine (TEGRETOL XR) 200 MG 12 hr tablet Take 1,000 mg by mouth every morning.    [provider]  celecoxib (CELEBREX) 200 MG capsule Take 200 mg by mouth daily as needed for mild pain.    [provider]  cholecalciferol (VITAMIN D) 1000 UNITS tablet Take 1,000 Units by mouth daily.    [provider]  cloNIDine (CATAPRES) 0.3 MG tablet Take 0.3 mg by mouth 3 (three) times daily.    [provider]  ferrous sulfate 325 (65 FE) MG tablet Take 1 tablet (325 mg total) by mouth daily. 05/19/15   Fritzi Mandes, MD  fluPHENAZine (PROLIXIN) 2.5 MG  tablet Take 2.5 mg by mouth 2 (two) times daily.     [provider]  Fluticasone-Salmeterol (ADVAIR) 500-50 MCG/DOSE AEPB Inhale 1 puff into the lungs 2 (two) times daily.    [provider]  hydrALAZINE (APRESOLINE) 100 MG tablet Take 1 tablet (100 mg total) by mouth 2 (two) times daily. 09/13/18   Bettey Costa, MD  minoxidil (LONITEN) 10 MG tablet Take 10 mg by mouth 2 (two) times daily.    [provider]  multivitamin (RENA-VIT) TABS tablet Take 1 tablet by mouth daily.    [provider]  omeprazole (PRILOSEC) 40 MG capsule Take 1 capsule (40 mg total) by mouth daily. 05/06/17   Henreitta Leber, MD  oxyCODONE (ROXICODONE) 5 MG immediate release tablet Take 1 tablet (5 mg total) by mouth every 8 (eight) hours as needed. 09/11/19 09/10/20  Duanne Guess, PA-C  trihexyphenidyl (ARTANE) 2 MG tablet Take 2 mg by mouth 2 (two) times daily.    [provider]    Physical Exam: There were no vitals filed for this visit.  Constitutional: NAD, calm, male  sitting in chair with frequent dry cough Eyes: PERRL, lids and conjunctivae normal ENMT: Mucous membranes are moist. Posterior pharynx clear of any exudate or lesions.No dentition.  Neck: normal, supple Respiratory: Decreased aeration throughout but no wheezing, no crackles. Normal respiratory effort but with frequent coughing. No accessory muscle use.  Cardiovascular: Regular rate and rhythm, no murmurs / rubs / gallops. No extremity edema. .  Abdomen: no tenderness, no masses palpated.  Bowel sounds positive.  Musculoskeletal: no clubbing / cyanosis. No joint deformity upper and lower extremities. Good ROM, no contractures. Normal muscle tone.  Skin: no rashes, lesions, ulcers. No induration Neurologic: Garbled speech which appears to be his baseline per documentation and could be due to missing dentition.  CN 2-12 grossly intact. Sensation intact,  Strength 5/5 in all 4.  Psychiatric: Alert and  oriented x 3. Normal mood.     Labs on Admission: I have personally reviewed following labs and imaging studies  CBC: Recent Labs  Lab 10/16/19 0646 10/20/19 0845  WBC 5.6 6.1  HGB 10.9* 9.5*  HCT 31.2* 27.3*  MCV 94.5 94.5  PLT 216 878   Basic Metabolic Panel: Recent Labs  Lab 10/16/19 0646 10/20/19 0845  NA 135 136  K 5.2* 4.6  CL 94* 96*  CO2 25 24  GLUCOSE 86 130*  BUN 91* 91*  CREATININE 9.27* 8.58*  CALCIUM 10.1 9.5  PHOS 4.8* 3.2   GFR: CrCl cannot be calculated (Unknown ideal weight.). Liver Function Tests: Recent Labs  Lab 10/16/19 0646 10/20/19 0845  ALBUMIN 3.8 3.8   No results for input(s): LIPASE, AMYLASE in the last 168 hours. No results for input(s): AMMONIA in the last 168 hours. Coagulation Profile: No results for input(s): INR, PROTIME in the last 168 hours. Cardiac Enzymes: No results for input(s): CKTOTAL, CKMB, CKMBINDEX, TROPONINI in the last 168 hours. BNP (last 3 results) No results for input(s): PROBNP in the last 8760 hours. HbA1C: No results for input(s): HGBA1C in the last 72 hours. CBG: No results for input(s): GLUCAP in the last 168 hours. Lipid Profile: No results for input(s): CHOL, HDL, LDLCALC, TRIG, CHOLHDL, LDLDIRECT in the last 72 hours. Thyroid Function Tests: No results for input(s): TSH, T4TOTAL, FREET4, T3FREE, THYROIDAB in the last 72 hours. Anemia Panel: No results for input(s): VITAMINB12, FOLATE, FERRITIN, TIBC, IRON, RETICCTPCT in the last 72 hours. Urine analysis:    Component Value Date/Time   COLORURINE YELLOW (A) 05/17/2015 0012   APPEARANCEUR HAZY (A) 05/17/2015 0012   LABSPEC 1.009 05/17/2015 0012   PHURINE 7.0 05/17/2015 0012   GLUCOSEU NEGATIVE 05/17/2015 0012   HGBUR 1+ (A) 05/17/2015 0012   BILIRUBINUR NEGATIVE 05/17/2015 0012   KETONESUR NEGATIVE 05/17/2015 0012   PROTEINUR 30 (A) 05/17/2015 0012   NITRITE NEGATIVE 05/17/2015 0012   LEUKOCYTESUR 3+ (A) 05/17/2015 0012    Radiological  Exams on Admission: No results found.    Assessment/Plan  Acute respiratory distress likely secondary to acute asthma exacerbation Will obtain stat CBC, BMP,chest x-ray and check PCR Covid test.  Previous lab work on 5/21 did not show any leukocytosis with stable anemia.  Had normal electrolytes and stable creatinine followed by nephrology. Will give one-time IV magnesium scheduled albuterol nebulizer q4hr Continuous pulse oximetry monitoring  Schizophrenia Patient initially admitted for increased agitation on 5/5.  Continue psych medications per psychiatry. Will transfer back to behavior health until once medically stable  ESRD on dialysis MWF Continue nephrology management  Hypertension Continue all antihypertensives  Anemia chronic disease  Hemoglobin at goal Patient is on Epogen protocol per nephrology  DVT prophylaxis:.Lovenox Code Status: Full Family Communication: Plan discussed with patient at bedside  disposition Plan: Transfer back to behavior health unit with observation Consults called:  Admission status: Observation  Sign out given to on-call hospitalist NP Todd Brown who will follow up on stat labs and CXR. Patient stable at the time of my departure.   Status is: Observation  The patient remains OBS appropriate and will d/c before 2 midnights.  Dispo: The patient is from: Behavior health unit              Anticipated d/c is to: Behavior health unit              Anticipated d/c date is: 1 day              Patient currently is not medically stable to d/c.         Orene Desanctis DO [facility]   If 7PM-7AM, please contact night-coverage [email or web address]   10/23/2019, 1:13 AM

## 2019-10-23 NOTE — Progress Notes (Signed)
Spoke with NP via the unit (face to face) about patient's elevated blood pressure 194/110, per NP she will decided after the IV is inserted. No new orders were placed at this time.

## 2019-10-23 NOTE — Progress Notes (Signed)
[place] sent secure chat message to NP to clarify the magnesium IV order. NP replied at [facility] per NP "IV mag is for suspected asthma exacerbation".

## 2019-10-23 NOTE — Progress Notes (Signed)
Patient is been transferred to 1A, ( Medical floor ) report given to [facility] RN, patient appears to be in distress, coughing profusely and clo of SOB, patient is IVC Nurse Amedeo Plenty and security accompanied him to the medical floor.

## 2019-10-23 NOTE — Progress Notes (Signed)
[facility] [place] NAME: Todd Brown    MR#:  [id number]  DATE OF BIRTH:  [birth year]  SUBJECTIVE:   Patient seen at hemodialysis. Denies any complaints. No chest pain. Able to complete sentence without shortness of breath. REVIEW OF SYSTEMS:   Review of Systems  Unable to perform ROS: Psychiatric disorder   Tolerating Diet: Tolerating PT:   DRUG ALLERGIES:   Allergies  Allergen Reactions  . Chlorpromazine Other (See Comments)    Reaction:  Unknown , pt states it makes him feel real bad Reaction:  Unknown , pt states it makes him feel real bad     VITALS:  Blood pressure (!) 164/91, pulse 66, temperature 98 F (36.7 C), temperature source Oral, resp. rate 13, weight 69.4 kg, SpO2 100 %.  PHYSICAL EXAMINATION:   Physical Exam  GENERAL:  59 y.o.-year-old patient lying in the bed with no acute distress.  EYES: Pupils equal, round, reactive to light and accommodation. No scleral icterus.    tenderness.  LUNGS: Normal breath sounds bilaterally, no wheezing, rales, rhonchi. No use of accessory muscles of respiration.  CARDIOVASCULAR: S1, S2 normal. No murmurs, rubs, or gallops.  ABDOMEN: Soft, nontender, nondistended. Bowel sounds present. No organomegaly or mass.  EXTREMITIES: No cyanosis, clubbing or edema b/l.    NEUROLOGIC: Cranial nerves II through XII are intact. No focal Motor or sensory deficits b/l.   PSYCHIATRIC:  patient is alert and awake SKIN: No obvious rash, lesion, or ulcer.   LABORATORY PANEL:  CBC Recent Labs  Lab 10/23/19 0405  WBC 5.7  HGB 9.1*  HCT 26.3*  PLT 187    Chemistries  Recent Labs  Lab 10/23/19 0405  NA 132*  K 5.7*  CL 93*  CO2 23  GLUCOSE 105*  BUN 101*  CREATININE 8.95*  CALCIUM 9.4  MG 2.1   Cardiac Enzymes No results for input(s): TROPONINI in the last 168 hours. RADIOLOGY:  DG Chest Port 1 View  Result Date: 10/23/2019 CLINICAL DATA:  Hypoxia EXAM: PORTABLE CHEST 1  VIEW COMPARISON:  10/08/2018 FINDINGS: Chronic cardiomegaly with normal aortic and hilar contours. Interstitial coarsening above prior baseline that is generalized. Possible trace right pleural effusion IMPRESSION: Cardiomegaly and mild interstitial edema Electronically Signed   By: Monte Fantasia M.D.   On: 10/23/2019 04:05   ASSESSMENT AND PLAN:  Todd Brown is a 59 y.o. male with medical history significant for schizophrenia, ESRD on dialysis MWF, hypertension, asthma, hepatitis C, history of acute pulmonary edema who is being admitted from behavioral health unit for concerns of acute respiratory distress. Patient was initially admitted on 5/5 by psychiatry for schizophrenia and increased agitation.  He has been followed by nephrology during this hospitalization for dialysis  Acute respiratory distress likely secondary to acute mild pulmonary edema -  Previous lab work on 5/21 did not show any leukocytosis with stable anemia.  Had normal electrolytes and stable creatinine followed by nephrology. -Chest x-ray showed mild cardiomegaly and pulmonary vascular congestion -patient underwent hemodialysis and had ultrafiltration of 3.5 L today. Currently on room air. Breathing comfortably. -prn albuterol nebulizer q4hr -try to wean off oxygen to room air.  Schizophrenia Patient initially admitted for increased agitation on 5/5.  - Continue psych medications per psychiatry-- I have asked Dr. Janese Banks to resume his psych meds. I'm not sure which ones he is on. -Will transfer back to behavior health until once medically stable  ESRD on dialysis MWF hyperkalemia Continue  nephrology management Dr. Candiss Norse aware. Got hemodialysis today.  Hypertension Continue all antihypertensives  Anemia chronic disease Hemoglobin at goal Patient is on Epogen protocol per nephrology  DVT prophylaxis:.Lovenox Code Status: Full Family Communication: Plan discussed with patient at bedside  disposition Plan:  Transfer back to behavior health unit with observation Consults called:  Admission status: Observation    Status is: Observation  The patient remains OBS appropriate and will d/c before 2 midnights.  Dispo: The patient is from: Behavior health unit  Anticipated d/c is to: Behavior health unit  Anticipated d/c date is: 1 day  Patient currently is  medically stable to d/c. However will observe one more night to ensure sats remain stable.   TOTAL TIME TAKING CARE OF THIS PATIENT: 35* minutes.  >50% time spent on counselling and coordination of care  Note: This dictation was prepared with Dragon dictation along with smaller phrase technology. Any transcriptional errors that result from this process are unintentional.  Fritzi Mandes M.D    [facility]   CC: Primary care physician; System, Pcp Not InPatient ID: JOSEPHMICHAEL LISENBEE, male   DOB: 12-13-1960, 59 y.o.   MRN: [id number]

## 2019-10-23 NOTE — Progress Notes (Signed)
At 0350 am sent a second messaged to NP Sharion Settler to follow up about the elevated BP 194/110. No response at this time.

## 2019-10-23 NOTE — Progress Notes (Signed)
HD initiated 

## 2019-10-23 NOTE — Evaluation (Signed)
Received a call from pt's nurse in reference to respiratory concerns. A nebulizer order was put in and the hosptialist on call was paged @ 0005.

## 2019-10-23 NOTE — Progress Notes (Signed)
   10/23/19 0143  Assess: MEWS Score  Temp 97.6 F (36.4 C)  BP (!) 209/105  Pulse Rate 77  Resp 20  Level of Consciousness Alert  SpO2 100 %  O2 Device Nasal Cannula  O2 Flow Rate (L/min) 2 L/min  Assess: MEWS Score  MEWS Temp 0  MEWS Systolic 2  MEWS Pulse 0  MEWS RR 0  MEWS LOC 0  MEWS Score 2  MEWS Score Color Yellow  Assess: if the MEWS score is Yellow or Red  Were vital signs taken at a resting state? Yes  Focused Assessment Documented focused assessment  Early Detection of Sepsis Score *See Row Information* Low  MEWS guidelines implemented *See Row Information* Yes  Treat  MEWS Interventions Other (Comment) (notified NP and Charge RN of current BP)  Take Vital Signs  Increase Vital Sign Frequency  Yellow: Q 2hr X 2 then Q 4hr X 2, if remains yellow, continue Q 4hrs  Escalate  MEWS: Escalate Yellow: discuss with charge nurse/RN and consider discussing with provider and RRT  Notify: Charge Nurse/RN  Name of Charge Nurse/RN Notified Wilder Glade  Date Charge Nurse/RN Notified 10/23/19  Time Charge Nurse/RN Notified 0216  Notify: Provider  Provider Name/Title Sharion Settler  Date Provider Notified 10/23/19  Time Provider Notified [phone]  Notification Type Face-to-face (via the unit)  Notification Reason Change in status (BP 209/105)  Response Other (Comment);No new orders (per NP recheck BP in 30 minutes)  Date of Provider Response 10/23/19  Time of Provider Response [phone]   Patient newly admitted from [facility] health due to respiratory issue. Patient has ESRD.

## 2019-10-23 NOTE — Progress Notes (Signed)
[facility], [place] 10/23/19  Subjective:   LOS: 0 Patient seen during dialysis Tolerating well Patient was transferred from psychiatry unit to acute hospital for acute respiratory distress.  Per notes, he developed sudden acute persistent coughing with some shortness of breath Oxygen saturation down to 70s on room air   Tolerating dialysis well when seen Blood pressure remains elevated    Objective:  Vital signs in last 24 hours:  Temp:  [97.6 F (36.4 C)-98.1 F (36.7 C)] 98.1 F (36.7 C) (05/24 1439) Pulse Rate:  [59-77] 69 (05/24 1439) Resp:  [13-26] 18 (05/24 1439) BP: (164-217)/(78-110) 200/92 (05/24 1439) SpO2:  [95 %-100 %] 98 % (05/24 1439) Weight:  [69.4 kg] 69.4 kg (05/24 0400)  Weight change:  Filed Weights   10/23/19 0400  Weight: 69.4 kg    Intake/Output:   No intake or output data in the 24 hours ending 10/23/19 1447  Physical Exam: General:  No acute distress, laying in the bed  HEENT  anicteric, moist oral mucous membrane  Pulm/lungs  normal breathing effort, mild crackles at bases,  CVS/Heart  regular rhythm, no rub or gallop  Abdomen:   Soft, nontender  Extremities:  No peripheral edema  Neurologic:  Alert, able to follow commands  Skin:  No acute rashes      Basic Metabolic Panel:  Recent Labs  Lab 10/20/19 0845 10/23/19 0405  NA 136 132*  K 4.6 5.7*  CL 96* 93*  CO2 24 23  GLUCOSE 130* 105*  BUN 91* 101*  CREATININE 8.58* 8.95*  CALCIUM 9.5 9.4  MG  --  2.1  PHOS 3.2  --      CBC: Recent Labs  Lab 10/20/19 0845 10/23/19 0405  WBC 6.1 5.7  HGB 9.5* 9.1*  HCT 27.3* 26.3*  MCV 94.5 96.0  PLT 180 187      Lab Results  Component Value Date   HEPBSAG Negative 05/05/2017   HEPBSAB Reactive 05/26/2016      Microbiology:  Recent Results (from the past 240 hour(s))  SARS Coronavirus 2 by RT PCR (hospital order, performed in [place] hospital lab) Nasopharyngeal Nasopharyngeal Swab      Status: None   Collection Time: 10/23/19  3:01 AM   Specimen: Nasopharyngeal Swab  Result Value Ref Range Status   SARS Coronavirus 2 NEGATIVE NEGATIVE Final    Comment: (NOTE) SARS-CoV-2 target nucleic acids are NOT DETECTED. The SARS-CoV-2 RNA is generally detectable in upper and lower respiratory specimens during the acute phase of infection. The lowest concentration of SARS-CoV-2 viral copies this assay can detect is 250 copies / mL. A negative result does not preclude SARS-CoV-2 infection and should not be used as the sole basis for treatment or other patient management decisions.  A negative result may occur with improper specimen collection / handling, submission of specimen other than nasopharyngeal swab, presence of viral mutation(s) within the areas targeted by this assay, and inadequate number of viral copies (<250 copies / mL). A negative result must be combined with clinical observations, patient history, and epidemiological information. Fact Sheet for Patients:   StrictlyIdeas.no Fact Sheet for Healthcare Providers: BankingDealers.co.za This test is not yet approved or cleared  by the [place] FDA and has been authorized for detection and/or diagnosis of SARS-CoV-2 by FDA under an Emergency Use Authorization (EUA).  This EUA will remain in effect (meaning this test can be used) for the duration of the COVID-19 declaration under Section 564(b)(1) of the Act,  21 U.S.C. section 360bbb-3(b)(1), unless the authorization is terminated or revoked sooner. Performed at [facility], [address] [place], [place] [address]     Coagulation Studies: No results for input(s): LABPROT, INR in the last 72 hours.  Urinalysis: No results for input(s): COLORURINE, LABSPEC, PHURINE, GLUCOSEU, HGBUR, BILIRUBINUR, KETONESUR, PROTEINUR, UROBILINOGEN, NITRITE, LEUKOCYTESUR in the last 72 hours.  Invalid input(s):  APPERANCEUR    Imaging: DG Chest Port 1 View  Result Date: 10/23/2019 CLINICAL DATA:  Hypoxia EXAM: PORTABLE CHEST 1 VIEW COMPARISON:  10/08/2018 FINDINGS: Chronic cardiomegaly with normal aortic and hilar contours. Interstitial coarsening above prior baseline that is generalized. Possible trace right pleural effusion IMPRESSION: Cardiomegaly and mild interstitial edema Electronically Signed   By: Monte Fantasia M.D.   On: 10/23/2019 04:05     Medications:    . amLODipine  10 mg Oral Daily  . calcium acetate  1,334 mg Oral TID WC  . carbamazepine  1,000 mg Oral BH-q7a  . cloNIDine  0.3 mg Oral TID  . ferrous sulfate  325 mg Oral Daily  . heparin injection (subcutaneous)  5,000 Units Subcutaneous Q8H  . hydrALAZINE  10 mg Intravenous Once  . hydrALAZINE  100 mg Oral BID  . multivitamin  1 tablet Oral Daily  . pantoprazole  40 mg Oral Daily   acetaminophen, albuterol, celecoxib, guaiFENesin-codeine  Assessment/ Plan:  59 y.o. male with  end stage renal disease on hemodialysis, hypertension, schizophrenia, hepatitis C, history of DVT.  was admitted on 10/23/2019 for  Active Problems:   Schizophrenia [facility])   Essential hypertension, benign   End stage renal disease ([place])   Dialysis patient ([place])   Acute respiratory failure with hypoxia ([facility])   Anemia of chronic disease  Acute respiratory failure with hypoxia ([place]) [J96.01]   #. HTN  Patient is on multiple antihypertensives Expected to improve some with volume removal We will change amlodipine to evening dosing   #. ESRD [facility] MWF [address] Left AVF 73.5kg Dialysis today and then MWF schedule Ultrafiltration as tolerated   #. Anemia of CKD  Lab Results  Component Value Date   HGB 9.1 (L) 10/23/2019   Low dose EPO with HD Hold at present due to uncontrolled blood pressure  #. Secondary hyperparathyroidism of renal origin N 25.81     Component Value Date/Time   PTH 194 (H) 05/11/2017 0959   PTH  Comment 10/22/2014 0504   Lab Results  Component Value Date   PHOS 3.2 10/20/2019   Monitor calcium and phos level during this admission      LOS: 0 Todd Brown Todd Brown 5/24/20212:47 PM  [place], [place]

## 2019-10-23 NOTE — Progress Notes (Signed)
Rapid Response Event Note  Overview:pt sitting at bedside with non productive cough, nursing and RT /AC at bedside.      Initial Focused Assessment: pt vital signs WNL. No resp. distress noted pt cont. With intermittent coughing spells. Mild Rhonchi noted on ausculation. hospiatlist MD to assess  patient   Interventions:  Plan of Care (if not transferred):TX to rm 134 for labs and neb treatments  Event Summary:   at  [phone]    at          [facility]

## 2019-10-24 DIAGNOSIS — J9601 Acute respiratory failure with hypoxia: Secondary | ICD-10-CM | POA: Diagnosis not present

## 2019-10-24 LAB — CARBAMAZEPINE LEVEL, TOTAL: Carbamazepine Lvl: 3.7 ug/mL — ABNORMAL LOW (ref 4.0–12.0)

## 2019-10-24 LAB — POTASSIUM: Potassium: 4.8 mmol/L (ref 3.5–5.1)

## 2019-10-24 MED ORDER — THIOTHIXENE 5 MG PO CAPS
5.0000 mg | ORAL_CAPSULE | Freq: Every day | ORAL | Status: DC
  Filled 2019-10-24: qty 1

## 2019-10-24 MED ORDER — THIOTHIXENE 2 MG PO CAPS
2.0000 mg | ORAL_CAPSULE | Freq: Every day | ORAL | Status: DC
  Filled 2019-10-24: qty 1

## 2019-10-24 MED ORDER — THIOTHIXENE 2 MG PO CAPS: 2.0000 mg | ORAL_CAPSULE | Freq: Every day | ORAL | 0 refills | Status: DC

## 2019-10-24 MED ORDER — BENZTROPINE MESYLATE 0.5 MG PO TABS
0.5000 mg | ORAL_TABLET | Freq: Two times a day (BID) | ORAL | Status: DC
  Administered 2019-10-24: 0.5 mg via ORAL
  Filled 2019-10-24 (×2): qty 1

## 2019-10-24 MED ORDER — BENZTROPINE MESYLATE 0.5 MG PO TABS: 0.5000 mg | ORAL_TABLET | Freq: Two times a day (BID) | ORAL | 0 refills | Status: DC

## 2019-10-24 MED ORDER — SERTRALINE HCL 50 MG PO TABS
25.0000 mg | ORAL_TABLET | Freq: Every day | ORAL | Status: DC
  Administered 2019-10-24: 25 mg via ORAL
  Filled 2019-10-24: qty 1

## 2019-10-24 MED ORDER — SERTRALINE HCL 25 MG PO TABS: 25.0000 mg | ORAL_TABLET | Freq: Every day | ORAL | 0 refills | Status: DC

## 2019-10-24 NOTE — TOC Progression Note (Addendum)
Transition of Care [facility]) - Progression Note    Patient Details  Name: Todd Brown MRN: [id number] Date of Birth: 06-Apr-1961  Transition of Care [facility]) CM/SW Contact  Dupree, Gardiner Rhyme, LCSW Phone Number: 10/24/2019, 12:36 PM  Clinical Narrative:   Finally spoke with Delphina-ACT Team to inform of pt';s discharge. She reports [facility] and another group home on the [address] can not take him. His home is not the most optimal but they will but services in and try to supervise him more until he can move into a group home or ALF. Pt is wanting to go home. She will call this worker back regarding the time someone will be here to transport pt back home. Will let bedside RN know when I know.   2:55 pm Ride will be here in 15-20 minutes have let bedside RN know. Pt also aware of this      Expected Discharge Plan and Services           Expected Discharge Date: 10/24/19                                     Social Determinants of Health (SDOH) Interventions    Readmission Risk Interventions Readmission Risk Prevention Plan 10/09/2018  Transportation Screening Complete  PCP or Specialist Appt within 5-7 Days Complete  Home Care Screening Complete  Medication Review (RN CM) Complete  Some recent data might be hidden

## 2019-10-24 NOTE — TOC Transition Note (Addendum)
Transition of Care [facility]) - CM/SW Discharge Note   Patient Details  Name: Todd Brown MRN: [id number] Date of Birth: Mar 13, 1961  Transition [facility] Care [facility]) CM/SW Contact:  Elease Hashimoto, LCSW Phone Number: 10/24/2019, 10:22 AM   Clinical Narrative:   Met with pt to confirm he lives alone in an apartment. He reports that is correct. Made MD aware he lives in an apartment. He is followed by the ACT team and they direct his care along with keep an eye on him through telephone and appointments. He uses [facility] to go to HD. He reports he feels better and feels ready to go home. He is aware DR Janese Banks feels he can discharge from acute and does not need to return to [facility]. Have contacted ACT team and am awaiting return call regarding discharge and transport home. Have messaged MD and bedside RN regarding DC home versus going back to [facility]. Pt aware calling ACT team and will inform once speak to someone there.  12:00 Still waiting for Act team to call this worker back. Have informed pt regarding this. [facility] can arrange a ride for him by early afternoon.         Patient Goals and CMS Choice        Discharge Placement                       Discharge Plan and Services                                     Social Determinants of Health (SDOH) Interventions     Readmission Risk Interventions Readmission Risk Prevention Plan 10/09/2018  Transportation Screening Complete  PCP or Specialist Appt within 5-7 Days Complete  Home Care Screening Complete  Medication Review (RN CM) Complete  Some recent data might be hidden

## 2019-10-24 NOTE — Consult Note (Signed)
[place] Psychiatry Consult   Reason for Consult:  Psych status  Referring Physician:   Fritzi Mandes   Patient Identification: Todd Brown MRN:  [id number] Principal Diagnosis:   Chronic Schizophrenia Possible CVA's in past     Diagnosis:  Active Problems:   Schizophrenia ([place])   Essential hypertension, benign   End stage renal disease ([place])   Dialysis patient ([place])   Acute respiratory failure with hypoxia ([place])   Anemia of chronic disease   Total Time spent with patient:  30 minutes to forty   Subjective:   Todd Brown is a 59 y.o. male patient admitted with  Chronic Schizophrenia, renal failure, and respiratory distress  Originally sent to Psych but transferred to medicine for optimal care.   Now stable and can be discharged back to his group home.  SW has already seen him and there is a bed there held for the 25th  Although he presented with some agitation IED and aggressive thoughts, at this point he is calm, oriented to person,. And part of date   He denies frank SI HI plans or elements of psychosis including hearing voices, feeling paranoid and fearful and related symptoms.  No visual hallucinations either.   He has marked known speech articulation disorder, not clear the origin, but possibly from past CVA's developmental issues.   He is only on tegretol at this time, and says he will take additional medicines, including low dose antipsychotics   He has some anxiety and depression he says with excessive worry, nervousness, tension, frustration ---sometimes has somatic and autonomic symptoms, fear dread and doom,panic like feelings, along with some elements of major depression including low energy, motivation, concentration,lackof enthusiasm and motivation.   No other substance dependence history   History limited due to his speech but tried to glean what I could.   CW has asked his group home to have h   HPI:  As above     Past Psychiatric  History:  Mainly at group home, other not clear currently   Risk to Self:  none Risk to Others:  none Prior Inpatient Therapy:  not recently  Prior Outpatient Therapy:  he is followed by community mental health via group home   Past Medical History:  Past Medical History:  Diagnosis Date  . Anemia   . Diabetes mellitus without complication ([place])   . Dialysis patient [facility])    Mon. -Wed.- Fri  . 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 ESRD (end stage renal disease) ([place])   . Hyperlipidemia   . Malignant hypertension   . Renal artery stenosis ([place])   . Schizophrenia [facility])     Past Surgical History:  Procedure Laterality Date  . A/V FISTULAGRAM N/A 08/06/2016   Procedure: A/V Fistulagram;  Surgeon: Algernon Huxley, MD;  Location: [place];  Service: Cardiovascular;  Laterality: N/A;  . A/V SHUNT INTERVENTION N/A 08/06/2016   Procedure: A/V Shunt Intervention;  Surgeon: Algernon Huxley, MD;  Location: [place] [facility];  Service: Cardiovascular;  Laterality: N/A;  . AV FISTULA PLACEMENT Left 12/26/2014   Procedure: ARTERIOVENOUS (AV) FISTULA CREATION;  Surgeon: Algernon Huxley, MD;  Location: [facility] ORS;  Service: Vascular;  Laterality: Left;  . ESOPHAGOGASTRODUODENOSCOPY (EGD) WITH PROPOFOL N/A 05/06/2017   Procedure: ESOPHAGOGASTRODUODENOSCOPY (EGD) WITH PROPOFOL;  Surgeon: Jonathon Bellows, MD;  Location: [facility] ENDOSCOPY;  Service: Gastroenterology;  Laterality: N/A;  . INSERTION OF DIALYSIS CATHETER Right   .  PERIPHERAL VASCULAR CATHETERIZATION N/A 10/22/2014   Procedure: Dialysis/Perma Catheter Insertion;  Surgeon: Algernon Huxley, MD;  Location: [place] [facility];  Service: Cardiovascular;  Laterality: N/A;  . PERIPHERAL VASCULAR CATHETERIZATION N/A 02/28/2015   Procedure: Dialysis/Perma Catheter Removal;  Surgeon: Algernon Huxley, MD;  Location: [place] [facility];  Service: Cardiovascular;  Laterality: N/A;  . Repair fx left lower leg     yrs ago (age  41)   Family History:  Family History  Problem Relation Age of Onset  . Diabetes Neg Hx    Family Psychiatric  History: group - none significant affecting present admission  Social History:  Mainly in chronic state living at group home.  Frustrated sometimes with communication and listening --- Social History   Substance and Sexual Activity  Alcohol Use No     Social History   Substance and Sexual Activity  Drug Use Yes  . Frequency: 1.0 times per week  . Types: Marijuana    Social History   Socioeconomic History  . Marital status: Single    Spouse name: Not on file  . Number of children: Not on file  . Years of education: Not on file  . Highest education level: Not on file  Occupational History  . Occupation: Disability   Tobacco Use  . Smoking status: Current Every Day Smoker    Packs/day: 0.50    Types: Cigarettes  . Smokeless tobacco: Never Used  . Tobacco comment: 10 cigars a day  Substance and Sexual Activity  . Alcohol use: No  . Drug use: Yes    Frequency: 1.0 times per week    Types: Marijuana  . Sexual activity: Not Currently  Other Topics Concern  . Not on file  Social History Narrative  . Not on file   Social Determinants of Health   Financial Resource Strain:   . Difficulty of Paying Living Expenses:   Food Insecurity:   . Worried About Charity fundraiser in the Last Year:   . Arboriculturist in the Last Year:   Transportation Needs:   . Film/video editor (Medical):   Marland Kitchen Lack of Transportation (Non-Medical):   Physical Activity:   . Days of Exercise per Week:   . Minutes of Exercise per Session:   Stress:   . Feeling of Stress :   Social Connections:   . Frequency of Communication with Friends and Family:   . Frequency of Social Gatherings with Friends and Family:   . Attends Religious Services:   . Active Member of Clubs or Organizations:   . Attends Archivist Meetings:   Marland Kitchen Marital Status:    Additional Social  History:    Allergies:   Allergies  Allergen Reactions  . Chlorpromazine Other (See Comments)    Reaction:  Unknown , pt states it makes him feel real bad Reaction:  Unknown , pt states it makes him feel real bad     Labs:  Results for orders placed or performed during the hospital encounter of 10/23/19 (from the past 48 hour(s))  SARS Coronavirus 2 by RT PCR (hospital order, performed in [facility] hospital lab) Nasopharyngeal Nasopharyngeal Swab     Status: None   Collection Time: 10/23/19  3:01 AM   Specimen: Nasopharyngeal Swab  Result Value Ref Range   SARS Coronavirus 2 NEGATIVE NEGATIVE    Comment: (NOTE) SARS-CoV-2 target nucleic acids are NOT DETECTED. The SARS-CoV-2 RNA is generally detectable in upper and lower  respiratory specimens during the acute phase of infection. The lowest concentration of SARS-CoV-2 viral copies this assay can detect is 250 copies / mL. A negative result does not preclude SARS-CoV-2 infection and should not be used as the sole basis for treatment or other patient management decisions.  A negative result may occur with improper specimen collection / handling, submission of specimen other than nasopharyngeal swab, presence of viral mutation(s) within the areas targeted by this assay, and inadequate number of viral copies (<250 copies / mL). A negative result must be combined with clinical observations, patient history, and epidemiological information. Fact Sheet for Patients:   StrictlyIdeas.no Fact Sheet for Healthcare Providers: BankingDealers.co.za This test is not yet approved or cleared  by the [place] FDA and has been authorized for detection and/or diagnosis of SARS-CoV-2 by FDA under an Emergency Use Authorization (EUA).  This EUA will remain in effect (meaning this test can be used) for the duration of the COVID-19 declaration under Section 564(b)(1) of the Act, 21 U.S.C. section  360bbb-3(b)(1), unless the authorization is terminated or revoked sooner. Performed at [facility], [address] [place], [place] [address]   HIV Antibody (routine testing w rflx)     Status: None   Collection Time: 10/23/19  4:05 AM  Result Value Ref Range   HIV Screen 4th Generation wRfx Non Reactive Non Reactive    Comment: Performed at [facility], [place] [address], [place], [place] [address]  CBC     Status: Abnormal   Collection Time: 10/23/19  4:05 AM  Result Value Ref Range   WBC 5.7 4.0 - 10.5 K/uL   RBC 2.74 (L) 4.22 - 5.81 MIL/uL   Hemoglobin 9.1 (L) 13.0 - 17.0 g/dL   HCT 26.3 (L) 39.0 - 52.0 %   MCV 96.0 80.0 - 100.0 fL   MCH 33.2 26.0 - 34.0 pg   MCHC 34.6 30.0 - 36.0 g/dL   RDW 14.6 11.5 - 15.5 %   Platelets 187 150 - 400 K/uL   nRBC 0.0 0.0 - 0.2 %    Comment: Performed at [facility], [address] [place], [place] [address]  Basic metabolic panel     Status: Abnormal   Collection Time: 10/23/19  4:05 AM  Result Value Ref Range   Sodium 132 (L) 135 - 145 mmol/L   Potassium 5.7 (H) 3.5 - 5.1 mmol/L   Chloride 93 (L) 98 - 111 mmol/L   CO2 23 22 - 32 mmol/L   Glucose, Bld 105 (H) 70 - 99 mg/dL    Comment: Glucose reference range applies only to samples taken after fasting for at least 8 hours.   BUN 101 (H) 6 - 20 mg/dL    Comment: RESULT CONFIRMED BY MANUAL DILUTION [place]   Creatinine, Ser 8.95 (H) 0.61 - 1.24 mg/dL   Calcium 9.4 8.9 - 10.3 mg/dL   GFR calc non Af Amer 6 (L) >60 mL/min   GFR calc Af Amer 7 (L) >60 mL/min   Anion gap 16 (H) 5 - 15    Comment: Performed at [facility], [address], [place], [place] [address]  Magnesium     Status: None   Collection Time: 10/23/19  4:05 AM  Result Value Ref Range   Magnesium 2.1 1.7 - 2.4 mg/dL    Comment: Performed at [facility], [address] [place], [place] [address]  Potassium     Status: None   Collection Time:  10/24/19  8:44 AM  Result Value  Ref Range   Potassium 4.8 3.5 - 5.1 mmol/L    Comment: Performed at [facility], [address] [place], [place] [address]    Current Facility-Administered Medications  Medication Dose Route Frequency Provider Last Rate Last Admin  . acetaminophen (TYLENOL) tablet 500 mg  500 mg Oral Q4H PRN Fritzi Mandes, MD   500 mg at 10/23/19 2339  . albuterol (PROVENTIL) (2.5 MG/3ML) 0.083% nebulizer solution 2.5 mg  2.5 mg Nebulization Q6H PRN Fritzi Mandes, MD      . amLODipine (NORVASC) tablet 10 mg  10 mg Oral QPM Murlean Iba, MD   10 mg at 10/23/19 1719  . calcium acetate (PHOSLO) capsule 1,334 mg  1,334 mg Oral TID WC Fritzi Mandes, MD   1,334 mg at 10/24/19 0801  . carbamazepine (TEGRETOL XR) 12 hr tablet 1,000 mg  1,000 mg Oral Chong Sicilian, Gus Height, MD   1,000 mg at 10/23/19 0836  . celecoxib (CELEBREX) capsule 200 mg  200 mg Oral Daily PRN Fritzi Mandes, MD      . cloNIDine (CATAPRES) tablet 0.3 mg  0.3 mg Oral TID Fritzi Mandes, MD   0.3 mg at 10/24/19 0800  . ferrous sulfate tablet 325 mg  325 mg Oral Daily Fritzi Mandes, MD   325 mg at 10/24/19 0801  . guaiFENesin-codeine 100-10 MG/5ML solution 10 mL  10 mL Oral Q4H PRN Sharion Settler, NP   10 mL at 10/23/19 0430  . heparin injection 5,000 Units  5,000 Units Subcutaneous Q8H Sharion Settler, NP   5,000 Units at 10/24/19 0534  . hydrALAZINE (APRESOLINE) injection 10 mg  10 mg Intravenous Once Sharion Settler, NP      . hydrALAZINE (APRESOLINE) injection 10 mg  10 mg Intravenous Q6H PRN Fritzi Mandes, MD   10 mg at 10/23/19 1609  . hydrALAZINE (APRESOLINE) tablet 100 mg  100 mg Oral BID Fritzi Mandes, MD   100 mg at 10/24/19 0801  . multivitamin (RENA-VIT) tablet 1 tablet  1 tablet Oral Daily Fritzi Mandes, MD   1 tablet at 10/24/19 0802  . pantoprazole (PROTONIX) EC tablet 40 mg  40 mg Oral Daily Fritzi Mandes, MD   40 mg at 10/24/19 0800    Musculoskeletal: Strength & Muscle Tone:  At times needs PT and OT  Gait & Station: currently lying in  bed but reportedly ambulatory slowly  Patient leans: no particular lean   Psychiatric Specialty Exam: Physical Exam  Review of Systems generally already covered without change   Blood pressure (!) 188/98, pulse 73, temperature 98.6 F (37 C), temperature source Oral, resp. rate 18, weight 69.4 kg, SpO2 96 %.Body mass index is 19.12 kg/m.      Appearance AA male at rest, rapport okay speech very disarticulate hard to follow but answers yes and no-----pleasant cooperative   Speech --severe dysarthria   Mood pleasant, slightly anxious   Affect --as normal as possible   Psychosis --thought process and content --currently no frank hallucinations aud or visual, no severe LOA or FOI but hard to discern this from baseline dysarthria   Concentration and attention   Generally normal answers general questions   Consciousness --not clouded or fluctuant  Memory --not able to assess --a few general questions he answers with immediate and recent memory  suicidality homicidality ----contracts for safety no active SI HI or plans to harm others   Insight judgement reliability ---fair to poor   Intelligence and fund of  knowledge --poor                                Treatment Plan Summary:  Low dose Navane and Zoloft to be added today before discharge.  He is relatively stable at this point and with additional psych meds should be okay at group home unless new issues arise there.  Already followed there and he has a bed still waiting today for this     Disposition:   Home to group home.  Does not meet full inpatient criteria or IVC criteria at this time.    Eulas Post, MD 10/24/2019 9:32 AM

## 2019-10-24 NOTE — Progress Notes (Signed)
Pt sleeping. 02 saturation 99% on room air

## 2019-10-24 NOTE — Progress Notes (Signed)
AVS given and question answered. Pt left the floor via wheelchair by the sitter. [facility] personnel will get the pt.

## 2019-10-24 NOTE — Progress Notes (Signed)
[facility] NAME: Todd Brown    MR#:  [id number]  DATE OF BIRTH:  [birth year]  SUBJECTIVE:   Remains stable. sats 99% on RA No new issues per RN Sitter + Pt calm REVIEW OF SYSTEMS:   Review of Systems  Unable to perform ROS: Psychiatric disorder   Tolerating Diet:yes Tolerating PT: ambulatory  DRUG ALLERGIES:   Allergies  Allergen Reactions  . Chlorpromazine Other (See Comments)    Reaction:  Unknown , pt states it makes him feel real bad Reaction:  Unknown , pt states it makes him feel real bad     VITALS:  Blood pressure (!) 188/98, pulse 73, temperature 98.6 F (37 C), temperature source Oral, resp. rate 18, weight 69.4 kg, SpO2 96 %.  PHYSICAL EXAMINATION:   Physical Exam  GENERAL:  59 y.o.-year-old patient lying in the bed with no acute distress.  EYES: Pupils equal, round, reactive to light and accommodation. No scleral icterus.    tenderness.  LUNGS: Normal breath sounds bilaterally, no wheezing, rales, rhonchi. No use of accessory muscles of respiration.  CARDIOVASCULAR: S1, S2 normal. No murmurs, rubs, or gallops.  ABDOMEN: Soft, nontender, nondistended. Bowel sounds present. No organomegaly or mass.  EXTREMITIES: No cyanosis, clubbing or edema b/l.   HD access left UE NEUROLOGIC: Cranial nerves II through XII are intact. No focal Motor or sensory deficits b/l.  chronic garbled speech PSYCHIATRIC:  patient is alert and awake SKIN: No obvious rash, lesion, or ulcer.   LABORATORY PANEL:  CBC Recent Labs  Lab 10/23/19 0405  WBC 5.7  HGB 9.1*  HCT 26.3*  PLT 187    Chemistries  Recent Labs  Lab 10/23/19 0405  NA 132*  K 5.7*  CL 93*  CO2 23  GLUCOSE 105*  BUN 101*  CREATININE 8.95*  CALCIUM 9.4  MG 2.1   Cardiac Enzymes No results for input(s): TROPONINI in the last 168 hours. RADIOLOGY:  DG Chest Port 1 View  Result Date: 10/23/2019 CLINICAL DATA:  Hypoxia EXAM: PORTABLE CHEST 1  VIEW COMPARISON:  10/08/2018 FINDINGS: Chronic cardiomegaly with normal aortic and hilar contours. Interstitial coarsening above prior baseline that is generalized. Possible trace right pleural effusion IMPRESSION: Cardiomegaly and mild interstitial edema Electronically Signed   By: Monte Fantasia M.D.   On: 10/23/2019 04:05   ASSESSMENT AND PLAN:  Todd Brown is a 59 y.o. male with medical history significant for schizophrenia, ESRD on dialysis MWF, hypertension, asthma, hepatitis C, history of acute pulmonary edema who is being admitted from behavioral health unit for concerns of acute respiratory distress. Patient was initially admitted on 5/5 by psychiatry for schizophrenia and increased agitation.  He has been followed by nephrology during this hospitalization for dialysis  Acute respiratory distress likely secondary to acute mild pulmonary edema -  Previous lab work on 5/21 did not show any leukocytosis with stable anemia.  Had normal electrolytes and stable creatinine followed by nephrology. -Chest x-ray showed mild cardiomegaly and pulmonary vascular congestion -patient underwent hemodialysis and had ultrafiltration of 3.5 L -Currently on room air. Breathing comfortably. -prn albuterol nebulizer q4hr  Schizophrenia Patient initially admitted for increased agitation on 5/5.  - Continue psych medications per psychiatry-- I have asked Dr. Janese Banks to resume his psych meds. I'm not sure which ones he is on. -pt medically at baseline for transfer to inpt psych--Dr Janese Banks informed over the phone  ESRD on dialysis MWF hyperkalemia Continue nephrology management Dr.  Singh aware. Got hemodialysis yday -check K today  Hypertension Continue all antihypertensives  Anemia chronic disease Hemoglobin at goal Patient is on Epogen protocol per nephrology  DVT prophylaxis:.Lovenox Code Status: Full Family Communication: Plan discussed with patient at bedside  disposition Plan: Transfer  back to behavior health unit Consults called: psych, nephro Admission status: Observation    Status is: Observation  The patient remains OBS appropriate and will d/c before 2 midnights.  Dispo: The patient is from: Behavior health unit  Anticipated d/c is to: Behavior health unit  Anticipated d/c date is: hoping today if inpt psych takes him  Patient currently is  medically stable to d/c and ready fro transfer back to inpt psych--Dr rao to see pt today  TOTAL TIME TAKING CARE OF THIS PATIENT: 25* minutes.  >50% time spent on counselling and coordination of care  Note: This dictation was prepared with Dragon dictation along with smaller phrase technology. Any transcriptional errors that result from this process are unintentional.  Fritzi Mandes M.D    [facility]   CC: Primary care physician; System, Pcp Not InPatient ID: Todd Brown, male   DOB: 09-16-1960, 59 y.o.   MRN: [id number]

## 2019-10-24 NOTE — Discharge Summary (Addendum)
[facility] NAME: Todd Brown    MR#:  [id number]  DATE OF BIRTH:  10-25-1960  DATE OF ADMISSION:  10/23/2019 ADMITTING PHYSICIAN: Orene Desanctis, DO  DATE OF DISCHARGE: 10/24/2019  PRIMARY CARE PHYSICIAN: System, Pcp Not In    ADMISSION DIAGNOSIS:  Acute respiratory failure with hypoxia (HCC) [J96.01]  DISCHARGE DIAGNOSIS:  Acute hypoxic respiratory failure due to Volume overload/pulmonary edema--resolved Hyperkalemia--resolved ESRD on HD Chronic Schizophrenia SECONDARY DIAGNOSIS:   Past Medical History:  Diagnosis Date  . Anemia   . Diabetes mellitus without complication ([place])   . Dialysis patient [facility])    Mon. -Wed.- Fri  . DVT (deep venous thrombosis) ([place])    cephalic and basolic vein thrombosis  . ESRD (end stage renal disease) ([place])   . Hyperlipidemia   . Malignant hypertension   . Renal artery stenosis ([place])   . Schizophrenia [facility])     HOSPITAL COURSE:  Todd Brown a 59 y.o.malewith medical history significant forschizophrenia, ESRD on dialysis MWF, hypertension, asthma, hepatitis C, history of acute pulmonary edema who is being admitted from behavioral healthunitfor concerns of acute respiratory distress. Patient was initially admitted on 5/5 by psychiatry for schizophrenia and increased agitation. He has been followed by nephrology during this hospitalization for dialysis  Acute respiratory distress likely secondary to acute mild pulmonary edema - Previous lab work on 5/21 did not show any leukocytosis with stable anemia. Had normal electrolytes and stable creatinine followed by nephrology. -Chest x-ray showed mild cardiomegaly and pulmonary vascular congestion -patient underwent hemodialysis and had ultrafiltration of 3.5 L -Currently on room air. Breathing comfortably. -prnalbuterol nebulizer q4hr  Schizophrenia Patient initially admitted for increased agitation on 5/5.  -Dr Janese Banks  recommends Navane, zoloft ,cogentin. He recommends to d/c Prolixin and [place] from his stand point to d/c to Group home today.  ESRD on dialysis MWF hyperkalemia Continue nephrology management Dr. Candiss Norse aware. Got hemodialysis yday -checked K today--down to 4.8 -pt needs to be on strict RENAL DIET WITH FLUID RESTRICTION OF 1200 cc /day  Hypertension Continue all antihypertensives  Anemia chronic disease Hemoglobin at goal Patient is on Epogen protocol per nephrology  DVT prophylaxis:.Lovenox Code Status: Full Family Communication: Plan discussed with patient at bedside  disposition Plan:[facility]oup home Consults called: psych, nephro Admission status: Observation  Status is: Observation  The patient remains OBS appropriate and will d/c before 2 midnights.  Dispo: The patient is from:Behavior health unit Anticipated d/c is OE:[facility]JJK home Anticipated d/c date is: 10/24/2019 Patient currentlyis  medically stable to d/c   CONSULTS OBTAINED:  Treatment Team:  Eulas Post, MD Murlean Iba, MD  DRUG ALLERGIES:   Allergies  Allergen Reactions  . Chlorpromazine Other (See Comments)    Reaction:  Unknown , pt states it makes him feel real bad Reaction:  Unknown , pt states it makes him feel real bad     DISCHARGE MEDICATIONS:   Allergies as of 10/24/2019      Reactions   Chlorpromazine Other (See Comments)   Reaction:  Unknown , pt states it makes him feel real bad Reaction:  Unknown , pt states it makes him feel real bad      Medication List    STOP taking these medications   fluPHENAZine 2.5 MG tablet Commonly known as: PROLIXIN   oxyCODONE 5 MG immediate release tablet Commonly known as: Roxicodone   trihexyphenidyl 2 MG tablet Commonly known as: ARTANE     TAKE  these medications   acetaminophen 500 MG tablet Commonly known as: TYLENOL Take 500 mg by mouth every 4 (four) hours as needed for mild  pain or moderate pain.   amLODipine 10 MG tablet Commonly known as: NORVASC Take 1 tablet (10 mg total) by mouth daily.   benztropine 0.5 MG tablet Commonly known as: COGENTIN Take 1 tablet (0.5 mg total) by mouth 2 (two) times daily.   calcium acetate 667 MG capsule Commonly known as: PHOSLO Take 2 capsules (1,334 mg total) by mouth 3 (three) times daily with meals.   carbamazepine 200 MG 12 hr tablet Commonly known as: TEGRETOL XR Take 1,000 mg by mouth every morning.   celecoxib 200 MG capsule Commonly known as: CELEBREX Take 200 mg by mouth daily as needed for mild pain or moderate pain.   cholecalciferol 1000 units tablet Commonly known as: VITAMIN D Take 1,000 Units by mouth daily.   cloNIDine 0.3 MG tablet Commonly known as: CATAPRES Take 0.3 mg by mouth 3 (three) times daily.   ferrous sulfate 325 (65 FE) MG tablet Take 1 tablet (325 mg total) by mouth daily.   Fluticasone-Salmeterol 250-50 MCG/DOSE Aepb Commonly known as: ADVAIR Inhale 1 puff into the lungs 2 (two) times daily.   hydrALAZINE 100 MG tablet Commonly known as: APRESOLINE Take 1 tablet (100 mg total) by mouth 2 (two) times daily.   minoxidil 10 MG tablet Commonly known as: LONITEN Take 10 mg by mouth 2 (two) times daily.   multivitamin Tabs tablet Take 1 tablet by mouth daily.   omeprazole 40 MG capsule Commonly known as: PRILOSEC Take 1 capsule (40 mg total) by mouth daily.   ProAir HFA 108 (90 Base) MCG/ACT inhaler Generic drug: albuterol Inhale 1-2 puffs into the lungs every 4 (four) hours as needed for wheezing or shortness of breath.   sertraline 25 MG tablet Commonly known as: ZOLOFT Take 1 tablet (25 mg total) by mouth daily.   thiothixene 2 MG capsule Commonly known as: NAVANE Take 1 capsule (2 mg total) by mouth at bedtime.       If you experience worsening of your admission symptoms, develop shortness of breath, life threatening emergency, suicidal or homicidal  thoughts you must seek medical attention immediately by calling 911 or calling your MD immediately  if symptoms less severe.  You Must read complete instructions/literature along with all the possible adverse reactions/side effects for all the Medicines you take and that have been prescribed to you. Take any new Medicines after you have completely understood and accept all the possible adverse reactions/side effects.   Please note  You were cared for by a hospitalist during your hospital stay. If you have any questions about your discharge medications or the care you received while you were in the hospital after you are discharged, you can call the unit and asked to speak with the hospitalist on call if the hospitalist that took care of you is not available. Once you are discharged, your primary care physician will handle any further medical issues. Please note that NO REFILLS for any discharge medications will be authorized once you are discharged, as it is imperative that you return to your primary care physician (or establish a relationship with a primary care physician if you do not have one) for your aftercare needs so that they can reassess your need for medications and monitor your lab values.  DATA REVIEW:   CBC  Recent Labs  Lab 10/23/19 0405  WBC 5.7  HGB 9.1*  HCT 26.3*  PLT 187    Chemistries  Recent Labs  Lab 10/23/19 0405 10/23/19 0405 10/24/19 0844  NA 132*  --   --   K 5.7*   < > 4.8  CL 93*  --   --   CO2 23  --   --   GLUCOSE 105*  --   --   BUN 101*  --   --   CREATININE 8.95*  --   --   CALCIUM 9.4  --   --   MG 2.1  --   --    < > = values in this interval not displayed.    Microbiology Results   Recent Results (from the past 240 hour(s))  SARS Coronavirus 2 by RT PCR (hospital order, performed in [facility] hospital lab) Nasopharyngeal Nasopharyngeal Swab     Status: None   Collection Time: 10/23/19  3:01 AM   Specimen: Nasopharyngeal Swab  Result  Value Ref Range Status   SARS Coronavirus 2 NEGATIVE NEGATIVE Final    Comment: (NOTE) SARS-CoV-2 target nucleic acids are NOT DETECTED. The SARS-CoV-2 RNA is generally detectable in upper and lower respiratory specimens during the acute phase of infection. The lowest concentration of SARS-CoV-2 viral copies this assay can detect is 250 copies / mL. A negative result does not preclude SARS-CoV-2 infection and should not be used as the sole basis for treatment or other patient management decisions.  A negative result may occur with improper specimen collection / handling, submission of specimen other than nasopharyngeal swab, presence of viral mutation(s) within the areas targeted by this assay, and inadequate number of viral copies (<250 copies / mL). A negative result must be combined with clinical observations, patient history, and epidemiological information. Fact Sheet for Patients:   StrictlyIdeas.no Fact Sheet for Healthcare Providers: BankingDealers.co.za This test is not yet approved or cleared  by the [place] FDA and has been authorized for detection and/or diagnosis of SARS-CoV-2 by FDA under an Emergency Use Authorization (EUA).  This EUA will remain in effect (meaning this test can be used) for the duration of the COVID-19 declaration under Section 564(b)(1) of the Act, 21 U.S.C. section 360bbb-3(b)(1), unless the authorization is terminated or revoked sooner. Performed at [facility], [address] [place], [place] [address]     RADIOLOGY:  [facility] Chest Port 1 View  Result Date: 10/23/2019 CLINICAL DATA:  Hypoxia EXAM: PORTABLE CHEST 1 VIEW COMPARISON:  10/08/2018 FINDINGS: Chronic cardiomegaly with normal aortic and hilar contours. Interstitial coarsening above prior baseline that is generalized. Possible trace right pleural effusion IMPRESSION: Cardiomegaly and mild interstitial edema Electronically Signed    By: Monte Fantasia M.D.   On: 10/23/2019 04:05     CODE STATUS:     Code Status Orders  (From admission, onward)         Start     Ordered   10/23/19 0223  Full code  Continuous     10/23/19 0222        Code Status History    Date Active Date Inactive Code Status Order ID Comments User Context   10/04/2019 2323 10/23/2019 0213 Full Code [id number]  Caroline Sauger, NP Inpatient   10/08/2018 2245 10/09/2018 1656 Full Code [id number]  Vaughan Basta, MD Inpatient   09/12/2018 0147 09/13/2018 2046 Full Code [id number]  Sidney Ace, Arvella Merles, MD ED   05/11/2017 0123 05/13/2017 1857 Full Code [id number]  Salary, Avel Peace, MD Inpatient   04/30/2017  2216 05/07/2017 2158 Full Code [id number]  Vaughan Basta, MD Inpatient   08/03/2016 1426 08/18/2016 1547 Full Code [id number]  Wilhelmina Mcardle, MD ED   05/26/2016 1741 05/31/2016 1748 Full Code [id number]  Max Sane, MD Inpatient   05/16/2015 2343 05/20/2015 1852 Full Code [id number]  Lytle Butte, MD ED   10/20/2014 2101 10/31/2014 2002 Full Code [id number]  Hower, Aaron Mose, MD Inpatient   Advance Care Planning Activity       TOTAL TIME TAKING CARE OF THIS PATIENT: *40* minutes.    Fritzi Mandes M.D  [facility]    CC: Primary care physician; System, Pcp Not In

## 2019-10-24 NOTE — TOC Transition Note (Signed)
Transition of Care [facility]) - CM/SW Discharge Note   Patient Details  Name: Todd Brown MRN: [id number] Date of Birth: [birth year]  Transition of [facility] CM/SW Contact:  Elease Hashimoto, LCSW Phone Number: 10/24/2019, 10:08 AM   Clinical Narrative:  Met with pt to ask if he lives alone in an apartment. Dr. Janese Banks feels he does not have a need to go back to [facility] and can discharge home from inpatient hospital. Sr. Janese Banks under the assumption he lives in a group home which he does not. Have contacted Act Team to discuss follow and transport back home. Pt voiced they will transport him home and are his contact. Will await return call. Have messaged MD and beside RN regarding change in discharge plan.      Barriers to Discharge: Barriers Resolved   Patient Goals and CMS Choice Patient states their goals for this hospitalization and ongoing recovery are:: I will go back home I feel better now and don't wish to come back here      Discharge Placement                       Discharge Plan and Services In-house Referral: Clinical Social Work   Post Acute Care Choice: Dialysis                               Social Determinants of Health (SDOH) Interventions Alcohol Brief Interventions/Follow-up: AUDIT Score <7 follow-up not indicated   Readmission Risk Interventions Readmission Risk Prevention Plan 10/09/2018  Transportation Screening Complete  PCP or Specialist Appt within 5-7 Days Complete  Home Care Screening Complete  Medication Review (RN CM) Complete  Some recent data might be hidden

## 2019-10-24 NOTE — Discharge Instructions (Signed)
Resume your HD as before  Pt to f/u with his psychiatrist as out pt

## 2019-10-26 NOTE — Discharge Summary (Signed)
Physician Discharge Summary Note  Patient:  Todd Brown is an 59 y.o., male MRN:  [id number] DOB:  [birth year] Patient phone:  [phone] (home)  Patient address:   [address] [place] [address],  Total Time spent with patient: 15 minutes  Date of Admission:  10/04/2019 Date of Discharge: 10/23/2019  Reason for Admission: Originally admitted to the hospital because of a history of schizophrenia with disorganized behavior making it impossible for him to live at his current group home  Principal Problem: Schizophrenia [facility]) Discharge Diagnoses: Principal Problem:   Schizophrenia ([place]) Active Problems:   Essential hypertension, benign   Chronic kidney disease   Hepatitis C   End stage renal disease ([place])   Dialysis patient [facility])   Past Psychiatric History: Longstanding schizophrenia  Past Medical History:  Past Medical History:  Diagnosis Date  . Anemia   . Diabetes mellitus without complication ([place])   . Dialysis patient [facility])    Mon. -Wed.- Fri  . DVT (deep venous thrombosis) ([place])    cephalic and basolic vein thrombosis  . ESRD (end stage renal disease) ([place])   . Hyperlipidemia   . Malignant hypertension   . Renal artery stenosis ([place])   . Schizophrenia [facility])     Past Surgical History:  Procedure Laterality Date  . A/V FISTULAGRAM N/A 08/06/2016   Procedure: A/V Fistulagram;  Surgeon: Algernon Huxley, MD;  Location: [place] CV LAB;  Service: Cardiovascular;  Laterality: N/A;  . A/V SHUNT INTERVENTION N/A 08/06/2016   Procedure: A/V Shunt Intervention;  Surgeon: Algernon Huxley, MD;  Location: [place] CV LAB;  Service: Cardiovascular;  Laterality: N/A;  . AV FISTULA PLACEMENT Left 12/26/2014   Procedure: ARTERIOVENOUS (AV) FISTULA CREATION;  Surgeon: Algernon Huxley, MD;  Location: [facility] ORS;  Service: Vascular;  Laterality: Left;  . ESOPHAGOGASTRODUODENOSCOPY (EGD) WITH PROPOFOL N/A 05/06/2017   Procedure: ESOPHAGOGASTRODUODENOSCOPY (EGD) WITH  PROPOFOL;  Surgeon: Jonathon Bellows, MD;  Location: [facility] ENDOSCOPY;  Service: Gastroenterology;  Laterality: N/A;  . INSERTION OF DIALYSIS CATHETER Right   . PERIPHERAL VASCULAR CATHETERIZATION N/A 10/22/2014   Procedure: Dialysis/Perma Catheter Insertion;  Surgeon: Algernon Huxley, MD;  Location: [place] CV LAB;  Service: Cardiovascular;  Laterality: N/A;  . PERIPHERAL VASCULAR CATHETERIZATION N/A 02/28/2015   Procedure: Dialysis/Perma Catheter Removal;  Surgeon: Algernon Huxley, MD;  Location: [place] CV LAB;  Service: Cardiovascular;  Laterality: N/A;  . Repair fx left lower leg     yrs ago (age 81)   Family History:  Family History  Problem Relation Age of Onset  . Diabetes Neg Hx    Family Psychiatric  History: None known Social History:  Social History   Substance and Sexual Activity  Alcohol Use No     Social History   Substance and Sexual Activity  Drug Use Yes  . Frequency: 1.0 times per week  . Types: Marijuana    Social History   Socioeconomic History  . Marital status: Single    Spouse name: Not on file  . Number of children: Not on file  . Years of education: Not on file  . Highest education level: Not on file  Occupational History  . Occupation: Disability   Tobacco Use  . Smoking status: Current Every Day Smoker    Packs/day: 0.50    Types: Cigarettes  . Smokeless tobacco: Never Used  . Tobacco comment: 10 cigars a day  Substance and Sexual Activity  . Alcohol use: No  .  Drug use: Yes    Frequency: 1.0 times per week    Types: Marijuana  . Sexual activity: Not Currently  Other Topics Concern  . Not on file  Social History Narrative  . Not on file   Social Determinants of Health   Financial Resource Strain:   . Difficulty of Paying Living Expenses:   Food Insecurity:   . Worried About Charity fundraiser in the Last Year:   . Arboriculturist in the Last Year:   Transportation Needs:   . Film/video editor (Medical):   Marland Kitchen Lack of  Transportation (Non-Medical):   Physical Activity:   . Days of Exercise per Week:   . Minutes of Exercise per Session:   Stress:   . Feeling of Stress :   Social Connections:   . Frequency of Communication with Friends and Family:   . Frequency of Social Gatherings with Friends and Family:   . Attends Religious Services:   . Active Member of Clubs or Organizations:   . Attends Archivist Meetings:   Marland Kitchen Marital Status:     Hospital Course: Patient had been stabilized as far as his mental illness and behavior.  He developed shortness of breath and appeared to be acutely sick and was discharged on the 24th to the medical service.  Apparently he was stabilized quickly there and was later discharged home to a new placement that had been found.  Patient did not show any aggressive dangerous or violent behavior on the psychiatric ward and was cooperative with medication and treatment  Physical Findings: AIMS:  , ,  ,  ,    CIWA:    COWS:     Musculoskeletal: Strength & Muscle Tone: within normal limits Gait & Station: normal Patient leans: N/A  Psychiatric Specialty Exam: Physical Exam  Nursing note and vitals reviewed. Constitutional: He appears well-developed and well-nourished.  HENT:  Head: Normocephalic and atraumatic.  Eyes: Pupils are equal, round, and reactive to light. Conjunctivae are normal.  Cardiovascular: Regular rhythm and normal heart sounds.  Respiratory: Effort normal.  GI: Soft.  Musculoskeletal:        General: Normal range of motion.     Cervical back: Normal range of motion.  Neurological: He is alert.  Skin: Skin is warm and dry.  Psychiatric: He has a normal mood and affect. His speech is normal and behavior is normal. Judgment and thought content normal. Cognition and memory are normal.    Review of Systems  Constitutional: Negative.   HENT: Negative.   Eyes: Negative.   Respiratory: Negative.   Cardiovascular: Negative.   Gastrointestinal:  Negative.   Musculoskeletal: Negative.   Skin: Negative.   Neurological: Negative.   Psychiatric/Behavioral: Negative.     Blood pressure (!) 205/89, pulse 77, temperature 97.9 F (36.6 C), temperature source Oral, resp. rate (!) 26, height 6\' 3"  (1.905 m), weight 69.4 kg, SpO2 95 %.Body mass index is 19.12 kg/m.  General Appearance: Casual  Eye Contact:  Fair  Speech:  Normal Rate  Volume:  Normal  Mood:  Euthymic  Affect:  Constricted  Thought Process:  Goal Directed  Orientation:  Full (Time, Place, and Person)  Thought Content:  Logical  Suicidal Thoughts:  No  Homicidal Thoughts:  No  Memory:  Immediate;   Fair Recent;   Fair Remote;   Fair  Judgement:  Fair  Insight:  Fair  Psychomotor Activity:  Normal  Concentration:  Concentration: Fair  Recall:  [place] of Knowledge:  Fair  Language:  Fair  Akathisia:  No  Handed:  Right  AIMS (if indicated):     Assets:  Desire for Improvement  ADL's:  Impaired  Cognition:  Impaired,  Mild  Sleep:  Number of Hours: 6.5        Has this patient used any form of tobacco in the last 30 days? (Cigarettes, Smokeless Tobacco, Cigars, and/or Pipes) Yes, No  Blood Alcohol level:  Lab Results  Component Value Date   ETH <10 10/04/2019   ETH <5 02/72/5366    Metabolic Disorder Labs:  Lab Results  Component Value Date   HGBA1C 5.0 09/12/2018   MPG 97 09/12/2018   No results found for: PROLACTIN Lab Results  Component Value Date   CHOL 186 10/06/2019   TRIG 75 10/06/2019   HDL 77 10/06/2019   CHOLHDL 2.4 10/06/2019   VLDL 15 10/06/2019   LDLCALC 94 10/06/2019    See Psychiatric Specialty Exam and Suicide Risk Assessment completed by Attending Physician prior to discharge.  Discharge destination:  Other:  Transferred to medical service  Is patient on multiple antipsychotic therapies at discharge:  No   Has Patient had three or more failed trials of antipsychotic monotherapy by history:  No  Recommended Plan  for Multiple Antipsychotic Therapies: NA   Allergies as of 10/23/2019      Reactions   Chlorpromazine Other (See Comments)   Reaction:  Unknown , pt states it makes him feel real bad Reaction:  Unknown , pt states it makes him feel real bad      Medication List    ASK your doctor about these medications     Indication  acetaminophen 500 MG tablet Commonly known as: TYLENOL Take 500 mg by mouth every 4 (four) hours as needed for mild pain or moderate pain.    amLODipine 10 MG tablet Commonly known as: NORVASC Take 1 tablet (10 mg total) by mouth daily.    calcium acetate 667 MG capsule Commonly known as: PHOSLO Take 2 capsules (1,334 mg total) by mouth 3 (three) times daily with meals.    carbamazepine 200 MG 12 hr tablet Commonly known as: TEGRETOL XR Take 1,000 mg by mouth every morning.    celecoxib 200 MG capsule Commonly known as: CELEBREX Take 200 mg by mouth daily as needed for mild pain or moderate pain.    cholecalciferol 1000 units tablet Commonly known as: VITAMIN D Take 1,000 Units by mouth daily.    cloNIDine 0.3 MG tablet Commonly known as: CATAPRES Take 0.3 mg by mouth 3 (three) times daily.    ferrous sulfate 325 (65 FE) MG tablet Take 1 tablet (325 mg total) by mouth daily.    Fluticasone-Salmeterol 250-50 MCG/DOSE Aepb Commonly known as: ADVAIR Inhale 1 puff into the lungs 2 (two) times daily.    hydrALAZINE 100 MG tablet Commonly known as: APRESOLINE Take 1 tablet (100 mg total) by mouth 2 (two) times daily.    minoxidil 10 MG tablet Commonly known as: LONITEN Take 10 mg by mouth 2 (two) times daily.    multivitamin Tabs tablet Take 1 tablet by mouth daily.    omeprazole 40 MG capsule Commonly known as: PRILOSEC Take 1 capsule (40 mg total) by mouth daily.    ProAir HFA 108 (90 Base) MCG/ACT inhaler Generic drug: albuterol Inhale 1-2 puffs into the lungs every 4 (four) hours as needed for wheezing or shortness of breath.  Follow-up [place] Follow up.   Why: Your ACT Team will meet with you on... Contact information: [place] [address] [phone]           Follow-up recommendations:  Activity:  Activity as tolerated on medical service Diet:  Renal diet Other:  Follow-up with psychiatric treatment  Dialysis patient with schizophrenia.  Stabilized rapidly and discharged home.  Signed: Alethia Berthold, MD 10/26/2019, 5:57 PM

## 2020-06-18 ENCOUNTER — Encounter: Payer: Self-pay | Admitting: Emergency Medicine

## 2020-06-18 ENCOUNTER — Emergency Department
Admission: EM | Admit: 2020-06-18 | Discharge: 2020-06-18 | Disposition: A | Discharge status: Home / Self Care | Payer: Medicaid Other | Attending: Emergency Medicine | Admitting: Emergency Medicine

## 2020-06-18 ENCOUNTER — Other Ambulatory Visit: Payer: Self-pay

## 2020-06-18 DIAGNOSIS — Z79899 Other long term (current) drug therapy: Secondary | ICD-10-CM | POA: Insufficient documentation

## 2020-06-18 DIAGNOSIS — W268XXA Contact with other sharp object(s), not elsewhere classified, initial encounter: Secondary | ICD-10-CM | POA: Diagnosis not present

## 2020-06-18 DIAGNOSIS — F1590 Other stimulant use, unspecified, uncomplicated: Secondary | ICD-10-CM | POA: Insufficient documentation

## 2020-06-18 DIAGNOSIS — N186 End stage renal disease: Secondary | ICD-10-CM | POA: Insufficient documentation

## 2020-06-18 DIAGNOSIS — I120 Hypertensive chronic kidney disease with stage 5 chronic kidney disease or end stage renal disease: Secondary | ICD-10-CM | POA: Diagnosis not present

## 2020-06-18 DIAGNOSIS — F17210 Nicotine dependence, cigarettes, uncomplicated: Secondary | ICD-10-CM | POA: Insufficient documentation

## 2020-06-18 DIAGNOSIS — E1122 Type 2 diabetes mellitus with diabetic chronic kidney disease: Secondary | ICD-10-CM | POA: Diagnosis not present

## 2020-06-18 DIAGNOSIS — S81812A Laceration without foreign body, left lower leg, initial encounter: Secondary | ICD-10-CM | POA: Diagnosis not present

## 2020-06-18 DIAGNOSIS — Z992 Dependence on renal dialysis: Secondary | ICD-10-CM | POA: Diagnosis not present

## 2020-06-18 DIAGNOSIS — Y9384 Activity, sleeping: Secondary | ICD-10-CM | POA: Insufficient documentation

## 2020-06-18 DIAGNOSIS — Z23 Encounter for immunization: Secondary | ICD-10-CM | POA: Insufficient documentation

## 2020-06-18 DIAGNOSIS — Y92009 Unspecified place in unspecified non-institutional (private) residence as the place of occurrence of the external cause: Secondary | ICD-10-CM | POA: Insufficient documentation

## 2020-06-18 DIAGNOSIS — S8992XA Unspecified injury of left lower leg, initial encounter: Secondary | ICD-10-CM | POA: Diagnosis present

## 2020-06-18 MED ORDER — CEFAZOLIN SODIUM 1 G IJ SOLR
1.0000 g | Freq: Once | INTRAMUSCULAR | Status: AC
  Administered 2020-06-18: 1 g via INTRAMUSCULAR
  Filled 2020-06-18: qty 10

## 2020-06-18 MED ORDER — OXYCODONE-ACETAMINOPHEN 5-325 MG PO TABS
1.0000 | ORAL_TABLET | Freq: Once | ORAL | Status: AC
  Administered 2020-06-18: 1 via ORAL
  Filled 2020-06-18: qty 1

## 2020-06-18 MED ORDER — CEPHALEXIN 500 MG PO CAPS
500.0000 mg | ORAL_CAPSULE | Freq: Three times a day (TID) | ORAL | 0 refills | Status: DC
Start: 2020-06-18 — End: 2021-02-04

## 2020-06-18 MED ORDER — BACITRACIN ZINC 500 UNIT/GM EX OINT
TOPICAL_OINTMENT | Freq: Once | CUTANEOUS | Status: AC
  Filled 2020-06-18: qty 1.8

## 2020-06-18 MED ORDER — TETANUS-DIPHTH-ACELL PERTUSSIS 5-2.5-18.5 LF-MCG/0.5 IM SUSY
0.5000 mL | PREFILLED_SYRINGE | Freq: Once | INTRAMUSCULAR | Status: AC
  Administered 2020-06-18: 0.5 mL via INTRAMUSCULAR
  Filled 2020-06-18: qty 0.5

## 2020-06-18 NOTE — ED Notes (Signed)
Report called to [facility]. RN told to send pt back via EMS.

## 2020-06-18 NOTE — ED Notes (Signed)
[facility]  CALLED  FOR  TRANSPORT  TO  [facility] 

## 2020-06-18 NOTE — ED Provider Notes (Signed)
[facility] Emergency Department Provider Note   ____________________________________________   Event Date/Time   First MD Initiated Contact with Patient 06/18/20 0543     (approximate)  I have reviewed the triage vital signs and the nursing notes.   HISTORY  Chief Complaint Laceration    HPI Todd Brown is a 60 y.o. male brought to the ED via EMS from [place] [facility] status post fall with left lower leg laceration.  Patient states he was sleeping, having a nightmare and jumped up, cutting his leg on the metal bracket of his bed.  Medical history of ESRD on HD M/W/F, DM, hypertension, DVT not currently anticoagulated, schizophrenia.  Unknown date of last tetanus.  Denies striking head or LOC.  Denies vision changes, neck pain, headache, chest pain, shortness of breath, abdominal pain, nausea, vomiting or dizziness.     Past Medical History:  Diagnosis Date  . Anemia   . Diabetes mellitus without complication ([place])   . Dialysis patient [facility])    Mon. -Wed.- Fri  . 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 ESRD (end stage renal disease) ([place])   . Hyperlipidemia   . Malignant hypertension   . Renal artery stenosis ([place])   . Schizophrenia [facility])     Patient Active Problem List   Diagnosis Date Noted  . Acute respiratory failure with hypoxia ([place]) 10/23/2019  . Anemia of chronic disease 10/23/2019  . Fluid overload 10/08/2018  . Acute pulmonary edema ([place]) 09/12/2018  . Scrotal edema 05/10/2017  . Symptomatic anemia 04/30/2017  . Anticoagulated on Coumadin 09/14/2016  . Cardiac arrest ([place]) 08/03/2016  . Sepsis ([place]) 05/26/2016  . Dialysis patient ([place]) 04/15/2016  . End stage renal disease ([place]) 02/12/2016  . Hyperkalemia 05/20/2015  . Hepatitis C 09/26/2013  . Schizophrenia ([place]) 03/23/2013  . Essential hypertension, benign 03/23/2013  . Chronic kidney disease 03/23/2013    Past Surgical  History:  Procedure Laterality Date  . A/V FISTULAGRAM N/A 08/06/2016   Procedure: A/V Fistulagram;  Surgeon: Algernon Huxley, MD;  Location: [place] CV LAB;  Service: Cardiovascular;  Laterality: N/A;  . A/V SHUNT INTERVENTION N/A 08/06/2016   Procedure: A/V Shunt Intervention;  Surgeon: Algernon Huxley, MD;  Location: [place] CV LAB;  Service: Cardiovascular;  Laterality: N/A;  . AV FISTULA PLACEMENT Left 12/26/2014   Procedure: ARTERIOVENOUS (AV) FISTULA CREATION;  Surgeon: Algernon Huxley, MD;  Location: [facility] ORS;  Service: Vascular;  Laterality: Left;  . ESOPHAGOGASTRODUODENOSCOPY (EGD) WITH PROPOFOL N/A 05/06/2017   Procedure: ESOPHAGOGASTRODUODENOSCOPY (EGD) WITH PROPOFOL;  Surgeon: Jonathon Bellows, MD;  Location: [facility] ENDOSCOPY;  Service: Gastroenterology;  Laterality: N/A;  . INSERTION OF DIALYSIS CATHETER Right   . PERIPHERAL VASCULAR CATHETERIZATION N/A 10/22/2014   Procedure: Dialysis/Perma Catheter Insertion;  Surgeon: Algernon Huxley, MD;  Location: [place] CV LAB;  Service: Cardiovascular;  Laterality: N/A;  . PERIPHERAL VASCULAR CATHETERIZATION N/A 02/28/2015   Procedure: Dialysis/Perma Catheter Removal;  Surgeon: Algernon Huxley, MD;  Location: [place] CV LAB;  Service: Cardiovascular;  Laterality: N/A;  . Repair fx left lower leg     yrs ago (age 82)    Prior to Admission medications   Medication Sig Start Date End Date Taking? Authorizing Provider  cephALEXin (KEFLEX) 500 MG capsule Take 1 capsule (500 mg total) by mouth 3 (three) times daily. 06/18/20  Yes Paulette Blanch, MD  acetaminophen (TYLENOL) 500 MG tablet Take 500  mg by mouth every 4 (four) hours as needed for mild pain or moderate pain.     [provider]  albuterol (PROAIR HFA) 108 (90 Base) MCG/ACT inhaler Inhale 1-2 puffs into the lungs every 4 (four) hours as needed for wheezing or shortness of breath.     [provider]  amLODipine (NORVASC) 10 MG tablet Take 1 tablet (10 mg total) by mouth daily.  09/13/18   Bettey Costa, MD  benztropine (COGENTIN) 0.5 MG tablet Take 1 tablet (0.5 mg total) by mouth 2 (two) times daily. 10/24/19   Fritzi Mandes, MD  calcium acetate (PHOSLO) 667 MG capsule Take 2 capsules (1,334 mg total) by mouth 3 (three) times daily with meals. 05/19/15   Fritzi Mandes, MD  carbamazepine (TEGRETOL XR) 200 MG 12 hr tablet Take 1,000 mg by mouth every morning.    [provider]  celecoxib (CELEBREX) 200 MG capsule Take 200 mg by mouth daily as needed for mild pain or moderate pain.     [provider]  cholecalciferol (VITAMIN D) 1000 UNITS tablet Take 1,000 Units by mouth daily.    [provider]  cloNIDine (CATAPRES) 0.3 MG tablet Take 0.3 mg by mouth 3 (three) times daily.    [provider]  ferrous sulfate 325 (65 FE) MG tablet Take 1 tablet (325 mg total) by mouth daily. 05/19/15   Fritzi Mandes, MD  Fluticasone-Salmeterol (ADVAIR) 250-50 MCG/DOSE AEPB Inhale 1 puff into the lungs 2 (two) times daily.     [provider]  hydrALAZINE (APRESOLINE) 100 MG tablet Take 1 tablet (100 mg total) by mouth 2 (two) times daily. 09/13/18   Bettey Costa, MD  minoxidil (LONITEN) 10 MG tablet Take 10 mg by mouth 2 (two) times daily.    [provider]  multivitamin (RENA-VIT) TABS tablet Take 1 tablet by mouth daily.    [provider]  omeprazole (PRILOSEC) 40 MG capsule Take 1 capsule (40 mg total) by mouth daily. 05/06/17   Henreitta Leber, MD  sertraline (ZOLOFT) 25 MG tablet Take 1 tablet (25 mg total) by mouth daily. 10/24/19   Fritzi Mandes, MD  thiothixene (NAVANE) 2 MG capsule Take 1 capsule (2 mg total) by mouth at bedtime. 10/24/19   Fritzi Mandes, MD    Allergies Chlorpromazine  Family History  Problem Relation Age of Onset  . Diabetes Neg Hx     Social History Social History   Tobacco Use  . Smoking status: Current Every Day Smoker    Packs/day: 0.50    Types: Cigarettes  . Smokeless tobacco: Never Used  .  Tobacco comment: 10 cigars a day  Vaping Use  . Vaping Use: Never used  Substance Use Topics  . Alcohol use: No  . Drug use: Yes    Frequency: 1.0 times per week    Types: Marijuana    Review of Systems  Constitutional: No fever/chills Eyes: No visual changes. ENT: No sore throat. Cardiovascular: Denies chest pain. Respiratory: Denies shortness of breath. Gastrointestinal: No abdominal pain.  No nausea, no vomiting.  No diarrhea.  No constipation. Genitourinary: Negative for dysuria. Musculoskeletal: Positive for left lower leg laceration.  Negative for back pain. Skin: Negative for rash. Neurological: Negative for headaches, focal weakness or numbness.   ____________________________________________   PHYSICAL EXAM:  VITAL SIGNS: ED Triage Vitals  Enc Vitals Group     BP 06/18/20 0526 (!) 148/80     Pulse Rate 06/18/20 0526 98  Resp 06/18/20 0526 18     Temp 06/18/20 0526 98 F (36.7 C)     Temp Source 06/18/20 0526 Oral     SpO2 06/18/20 0417 100 %     Weight 06/18/20 0526 185 lb (83.9 kg)     Height 06/18/20 0526 '6\' 3"'$  (1.905 m)     Head Circumference --      Peak Flow --      Pain Score 06/18/20 0526 9     Pain Loc --      Pain Edu? --      Excl. in [place]? --     Constitutional: Alert and oriented.  Chronically ill appearing and in mild acute distress. Eyes: Conjunctivae are normal. PERRL. EOMI. Head: Atraumatic. Nose: No congestion/rhinnorhea. Mouth/Throat: Mucous membranes are moist.   Neck: No stridor.  No cervical spine tenderness to palpation. Cardiovascular: Normal rate, regular rhythm. Grossly normal heart sounds.  Good peripheral circulation. Respiratory: Normal respiratory effort.  No retractions. Lungs CTAB. Gastrointestinal: Soft and nontender to light or deep palpation. No distention. No abdominal bruits. No CVA tenderness. Musculoskeletal:  LLE: Large 11x 5 cm avulsion type laceration.  Bleeding controlled.  Palpable distal pulses.  Brisk  capillary refill. Neurologic:  Normal speech and language. No gross focal neurologic deficits are appreciated.  Skin:  Skin is warm, dry and intact. No rash noted. Psychiatric: Mood and affect are normal. Speech and behavior are normal.  ____________________________________________   LABS (all labs ordered are listed, but only abnormal results are displayed)  Labs Reviewed - No data to display ____________________________________________  EKG  None ____________________________________________  RADIOLOGY I, Cammy Sanjurjo J, personally viewed and evaluated these images (plain radiographs) as part of my medical decision making, as well as reviewing the written report by the radiologist.  ED MD interpretation: None  Official radiology report(s): No results found.  ____________________________________________   PROCEDURES  Procedure(s) performed (including Critical Care):  Marland KitchenMarland KitchenLaceration Repair  Date/Time: 06/18/2020 6:32 AM Performed by: Paulette Blanch, MD Authorized by: Paulette Blanch, MD   Consent:    Consent obtained:  Verbal   Consent given by:  Patient   Risks discussed:  Infection, pain, retained foreign body, poor cosmetic result and poor wound healing Anesthesia:    Anesthesia method:  Local infiltration   Local anesthetic:  Lidocaine 2% WITH epi Laceration details:    Location:  Leg   Leg location:  L lower leg   Length (cm):  11   Depth (mm):  5 Pre-procedure details:    Preparation:  Patient was prepped and draped in usual sterile fashion Exploration:    Hemostasis achieved with:  Direct pressure   Contaminated: no   Treatment:    Area cleansed with:  Saline and povidone-iodine   Amount of cleaning:  Extensive   Irrigation solution:  Sterile saline   Irrigation method:  Tap   Visualized foreign bodies/material removed: no   Skin repair:    Repair method:  Sutures   Suture size:  2-0 and 4-0   Suture material:  Nylon   Suture technique:  Simple  interrupted   Number of sutures:  11 Approximation:    Approximation:  Loose Repair type:    Repair type:  Simple Post-procedure details:    Dressing:  Sterile dressing, antibiotic ointment and bulky dressing   Procedure completion:  Tolerated well, no immediate complications  6 2-0 sutures 5 4-0 sutures  Post-repair:       ____________________________________________   INITIAL IMPRESSION /  ASSESSMENT AND PLAN / ED COURSE  As part of my medical decision making, I reviewed the following data within the [place] notes reviewed and incorporated and Notes from prior ED visits     60 year old male who presents with large left avulsion type lower leg laceration.  No arterial bleeding on examination.  Bleeding likely secondary to exsanguinated vein.  Denies chest pain, shortness of breath, nausea/vomiting or dizziness.  Laceration repaired to the best of my ability.  Will update tetanus, place on antibiotics and refer to wound care center for follow-up.  Walker provided to use as needed for ambulation.  Strict return precautions given.  Patient verbalizes understanding agrees with plan of care.      ____________________________________________   FINAL CLINICAL IMPRESSION(S) / ED DIAGNOSES  Final diagnoses:  Laceration of left lower extremity, initial encounter     ED Discharge Orders         Ordered    cephALEXin (KEFLEX) 500 MG capsule  3 times daily        06/18/20 [id number]    Ambulatory referral to Wound Clinic        06/18/20 [id number]          *Please note:  Kaleel Plett Oliveira was evaluated in Emergency Department on 06/18/2020 for the symptoms described in the history of present illness. He was evaluated in the context of the global COVID-19 pandemic, which necessitated consideration that the patient might be at risk for infection with the SARS-CoV-2 virus that causes COVID-19. Institutional protocols and algorithms that pertain to the evaluation of  patients at risk for COVID-19 are in a state of rapid change based on information released by regulatory bodies including the CDC and federal and state organizations. These policies and algorithms were followed during the patient's care in the ED.  Some ED evaluations and interventions may be delayed as a result of limited staffing during and the pandemic.*   Note:  This document was prepared using Dragon voice recognition software and may include unintentional dictation errors.   Paulette Blanch, MD 06/18/20 [phone]

## 2020-06-18 NOTE — ED Notes (Signed)
Patient given coffee and meal tray.

## 2020-06-18 NOTE — ED Triage Notes (Addendum)
EMS brings pt in from [facility]; staff reports unwitnessed fall; wound to left lower leg; pt to triage via w/c with no distress noted; st he was in bed, having a nightmare and jumped up injuring his left leg; st unsure what he cut it on; dialysis M-W-F; bulky gauze dressing saturated with blood; dressing removed and immed noted large amount bleeding; pressure applied and pt taken to room

## 2020-06-18 NOTE — Discharge Instructions (Signed)
1.  Suture removal in 7 to 10 days. 2.  Take antibiotic as prescribed (Keflex 500 mg 3 times daily x7 days). 3.  You may take Tylenol as needed for pain. 4.  Keep wound clean and dry. 5.  You have been referred to the wound care clinic for follow-up.  They should be contacting you to schedule an appointment. 6.  Return to the ER for worsening symptoms, persistent vomiting, increased redness/swelling, purulent discharge or other concerns.

## 2020-06-18 NOTE — ED Notes (Signed)
EMS arrived to transport patient.

## 2020-06-18 NOTE — ED Notes (Signed)
Pt has a laceration to left shin from hitting his leg while having a nightmare. Bleeding not controlled on arrival. Leg wrapped with pressure dressing and bleeding controlled.

## 2020-07-04 ENCOUNTER — Other Ambulatory Visit: Payer: Self-pay

## 2020-07-04 ENCOUNTER — Encounter: Payer: Medicaid Other | Attending: Physician Assistant | Admitting: Physician Assistant

## 2020-07-04 DIAGNOSIS — L97822 Non-pressure chronic ulcer of other part of left lower leg with fat layer exposed: Secondary | ICD-10-CM | POA: Diagnosis present

## 2020-07-04 DIAGNOSIS — Z992 Dependence on renal dialysis: Secondary | ICD-10-CM | POA: Diagnosis not present

## 2020-07-04 DIAGNOSIS — F2089 Other schizophrenia: Secondary | ICD-10-CM | POA: Diagnosis not present

## 2020-07-04 DIAGNOSIS — I120 Hypertensive chronic kidney disease with stage 5 chronic kidney disease or end stage renal disease: Secondary | ICD-10-CM | POA: Diagnosis not present

## 2020-07-04 DIAGNOSIS — F17200 Nicotine dependence, unspecified, uncomplicated: Secondary | ICD-10-CM | POA: Diagnosis not present

## 2020-07-04 DIAGNOSIS — N186 End stage renal disease: Secondary | ICD-10-CM | POA: Insufficient documentation

## 2020-07-04 NOTE — Progress Notes (Signed)
Corbridge, Kalib L. ([id number]) Visit Report for 07/04/2020 Abuse/Suicide Risk Screen Details Patient Name: NAVARETTE, Barkley L. Date of Service: 07/04/2020 9:45 AM Medical Record Number: [id number] Patient Account Number: [id number] Date of Birth/Sex: March 19, 1961 (60 y.o. M) Treating RN: Carlene Coria Primary Care Joseangel Nettleton: Dorisann Frames Other Clinician: Referring Marlena Barbato: SUNG, JADE Treating Yogesh Cominsky/Extender: [name] in Treatment: 0 Abuse/Suicide Risk Screen Items Answer ABUSE RISK SCREEN: Has anyone close to you tried to hurt or harm you recentlyo No Do you feel uncomfortable with anyone in your familyo No Has anyone forced you do things that you didnot want to doo No Electronic Signature(s) Signed: 07/04/2020 4:28:47 PM By: Carlene Coria RN Entered By: Carlene Coria on 07/04/2020 10:45:17 Azer, Siddhant L. ([id number]) -------------------------------------------------------------------------------- Activities of Daily Living Details Patient Name: Kesling, Hildred L. Date of Service: 07/04/2020 9:45 AM Medical Record Number: [id number] Patient Account Number: [id number] Date of Birth/Sex: 1961/05/24 (60 y.o. M) Treating RN: Carlene Coria Primary Care Jurgen Groeneveld: Dorisann Frames Other Clinician: Referring Seraphim Trow: SUNG, JADE Treating Miosotis Wetsel/Extender: [name] in Treatment: 0 Activities of Daily Living Items Answer Activities of Daily Living (Please select one for each item) Drive Automobile Not Able Take Medications Not Able Use Telephone Need Assistance Care for Appearance Need Assistance Use Toilet Completely Psychologist, forensic / Shower Need Assistance Dress Self Need Assistance Feed Self Completely Able Walk Not Able Get In / Out Bed Need Assistance Housework Not Able Prepare Meals Not [place] for Self Not Able Electronic Signature(s) Signed: 07/04/2020 4:28:47 PM By: Carlene Coria RN Entered By: Carlene Coria on 07/04/2020 10:46:31 Shontz,  Adith L. ([id number]) -------------------------------------------------------------------------------- Education Screening Details Patient Name: Marcantonio, Mercy L. Date of Service: 07/04/2020 9:45 AM Medical Record Number: [id number] Patient Account Number: [id number] Date of Birth/Sex: January 26, 1961 (60 y.o. M) Treating RN: Carlene Coria Primary Care Messiah Rovira: Dorisann Frames Other Clinician: Referring Danny Yackley: SUNG, JADE Treating Tennyson Wacha/Extender: [name] in Treatment: 0 Learning Preferences/Education Level/Primary Language Learning Preference: Explanation Highest Education Level: Grade School Preferred Language: English Cognitive Barrier Language Barrier: No Translator Needed: No Memory Deficit: No Emotional Barrier: No Cultural/Religious Beliefs Affecting Medical Care: No Physical Barrier Impaired Vision: No Impaired Hearing: No Decreased Hand dexterity: No Knowledge/Comprehension Knowledge Level: Medium Comprehension Level: Medium Ability to understand written instructions: High Ability to understand verbal instructions: High Motivation Anxiety Level: Anxious Cooperation: Cooperative Education Importance: Acknowledges Need Interest in Health Problems: Asks Questions Perception: Coherent Willingness to Engage in Self-Management High Activities: Readiness to Engage in Self-Management High Activities: Electronic Signature(s) Signed: 07/04/2020 4:28:47 PM By: Carlene Coria RN Entered By: Carlene Coria on 07/04/2020 10:47:07 Delehanty, Lorren L. ([id number]) -------------------------------------------------------------------------------- Fall Risk Assessment Details Patient Name: Dysert, Joven L. Date of Service: 07/04/2020 9:45 AM Medical Record Number: [id number] Patient Account Number: [id number] Date of Birth/Sex: [birth year] (60 y.o. M) Treating RN: Carlene Coria Primary Care Cathy Crounse: Dorisann Frames Other Clinician: Referring Patryck Kilgore: SUNG, JADE Treating  Taiana Temkin/Extender: [name] in Treatment: 0 Fall Risk Assessment Items Have you had 2 or more falls in the last 12 monthso 0 Yes Have you had any fall that resulted in injury in the last 12 monthso 0 Yes FALLS RISK SCREEN History of falling - immediate or within 3 months 25 Yes Secondary diagnosis (Do you have 2 or more medical diagnoseso) 0 No Ambulatory aid None/bed rest/wheelchair/nurse 0 No Crutches/cane/walker 0 No Furniture 0 No Intravenous therapy Access/Saline/Heparin Lock 0 No Gait/Transferring Normal/ bed rest/ wheelchair 0 No Weak (short steps with or without shuffle, stooped but  able to lift head while walking, may 0 No seek support from furniture) Impaired (short steps with shuffle, may have difficulty arising from chair, head down, impaired 0 No balance) Mental Status Oriented to own ability 0 No Electronic Signature(s) Signed: 07/04/2020 4:28:47 PM By: Carlene Coria RN Entered By: Carlene Coria on 07/04/2020 10:47:15 Kotz, Lawrance L. ([id number]) -------------------------------------------------------------------------------- Foot Assessment Details Patient Name: Romig, Kaisen L. Date of Service: 07/04/2020 9:45 AM Medical Record Number: [id number] Patient Account Number: [id number] Date of Birth/Sex: 11-09-1960 (60 y.o. M) Treating RN: Carlene Coria Primary Care Tina Gruner: Dorisann Frames Other Clinician: Referring Iram Astorino: SUNG, JADE Treating Kiwana Deblasi/Extender: [name] in Treatment: 0 Foot Assessment Items '[x]'$  Unable to perform due to altered mental status Site Locations + = Sensation present, - = Sensation absent, C = Callus, U = Ulcer R = Redness, W = Warmth, M = Maceration, PU = Pre-ulcerative lesion F = Fissure, S = Swelling, D = Dryness Assessment Right: Left: Other Deformity: No No Prior Foot Ulcer: No No Prior Amputation: No No Charcot Joint: No No Ambulatory Status: Non-ambulatory Assistance Device: Wheelchair Gait:  Administrator, arts) Signed: 07/04/2020 4:28:47 PM By: Carlene Coria RN Entered By: Carlene Coria on 07/04/2020 10:50:20 Rippetoe, Json L. ([id number]) -------------------------------------------------------------------------------- Nutrition Risk Screening Details Patient Name: Lanting, Jermon L. Date of Service: 07/04/2020 9:45 AM Medical Record Number: [id number] Patient Account Number: [id number] Date of Birth/Sex: 1961-03-16 (59 y.o. M) Treating RN: Carlene Coria Primary Care Addisson Frate: Dorisann Frames Other Clinician: Referring Laurisa Sahakian: SUNG, JADE Treating Jaisean Monteforte/Extender: [name] in Treatment: 0 Height (in): 75 Weight (lbs): 190 Body Mass Index (BMI): 23.7 Nutrition Risk Screening Items Score Screening NUTRITION RISK SCREEN: I have an illness or condition that made me change the kind and/or amount of food I eat 0 No I eat fewer than two meals per day 0 No I eat few fruits and vegetables, or milk products 0 No I have three or more drinks of beer, liquor or wine almost every day 0 No I have tooth or mouth problems that make it hard for me to eat 0 No I don't always have enough money to buy the food I need 0 No I eat alone most of the time 0 No I take three or more different prescribed or over-the-counter drugs a day 1 Yes Without wanting to, I have lost or gained 10 pounds in the last six months 0 No I am not always physically able to shop, cook and/or feed myself 2 Yes Nutrition Protocols Good Risk Protocol Moderate Risk Protocol 0 Provide education on nutrition High Risk Proctocol Risk Level: Moderate Risk Score: 3 Electronic Signature(s) Signed: 07/04/2020 4:28:47 PM By: Carlene Coria RN Entered By: Carlene Coria on 07/04/2020 10:47:32

## 2020-07-04 NOTE — Progress Notes (Signed)
Kaczorowski, Harlow L. ([id number]) Visit Report for 07/04/2020 Chief Complaint Document Details Patient Name: Todd Brown, [name] Date of Service: 07/04/2020 9:45 AM Medical Record Number: [id number] Patient Account Number: [id number] Date of Birth/Sex: 02/17/1961 (60 y.o. M) Treating RN: Dolan Amen Primary Care Provider: Dorisann Frames Other Clinician: Referring Provider: SUNG, JADE Treating Provider/Extender: [name] in Treatment: 0 Information Obtained from: Patient Chief Complaint Left LE Traumatic Ulcer Electronic Signature(s) Signed: 07/04/2020 11:00:47 AM By: Worthy Keeler PA-C Entered By: Worthy Keeler on 07/04/2020 11:00:47 Venkatesh, Tunis L. ([id number]) -------------------------------------------------------------------------------- Debridement Details Patient Name: Brown, [name] Date of Service: 07/04/2020 9:45 AM Medical Record Number: [id number] Patient Account Number: [id number] Date of Birth/Sex: 06-Nov-1960 (60 y.o. M) Treating RN: Dolan Amen Primary Care Provider: Dorisann Frames Other Clinician: Referring Provider: SUNG, JADE Treating Provider/Extender: [name] in Treatment: 0 Debridement Performed for Wound #1 Left,Anterior Lower Leg Assessment: Performed By: Physician Tommie Sams., PA-C Debridement Type: Debridement Level of Consciousness (Pre- Awake and Alert procedure): Pre-procedure Verification/Time Out Yes - 11:16 Taken: Start Time: 11:16 Pain Control: Other : benzocaine Total Area Debrided (L x W): 5.5 (cm) x 7 (cm) = 38.5 (cm) Tissue and other material Viable, Non-Viable, Eschar, Slough, Subcutaneous, Slough debrided: Level: Skin/Subcutaneous Tissue Debridement Description: Excisional Instrument: Curette, Forceps, Scissors Bleeding: Minimum Hemostasis Achieved: Pressure Response to Treatment: Procedure was tolerated well Level of Consciousness (Post- Awake and Alert procedure): Post Debridement Measurements  of Total Wound Length: (cm) 12.5 Width: (cm) 5.5 Depth: (cm) 0.6 Volume: (cm) 32.398 Character of Wound/Ulcer Post Debridement: Stable Post Procedure Diagnosis Same as Pre-procedure Electronic Signature(s) Signed: 07/04/2020 12:35:51 PM By: Charlett Nose RN Signed: 07/04/2020 6:00:33 PM By: Worthy Keeler PA-C Entered By: Georges Mouse, Minus Breeding on 07/04/2020 11:28:58 Lopiccolo, Edon L. ([id number]) -------------------------------------------------------------------------------- HPI Details Patient Name: Brown, [name] Date of Service: 07/04/2020 9:45 AM Medical Record Number: [id number] Patient Account Number: [id number] Date of Birth/Sex: 05/26/1961 (60 y.o. M) Treating RN: Dolan Amen Primary Care Provider: Dorisann Frames Other Clinician: Referring Provider: SUNG, JADE Treating Provider/Extender: [name] in Treatment: 0 History of Present Illness HPI Description: 07/04/2020 patient presents today for initial evaluation here in our clinic. He is actually a ward of the state. With that being said he is only been in the group home that he is currently staying in for the past few weeks in fact it was only about 2 weeks. He was seen in the ED for evaluation on 06/18/2020 due to an injury that he sustained to his left lower leg. He tells me that he got up in the middle the night to go to the bathroom or something and tripped over a wheelchair causing a significant skin tear/laceration to the leg. Unfortunately this was sutured but has dehisced in some areas and appears to have a significant hematoma and others. The proximal portion of the wound has actually necrosis and the skin flap is can have to be removed. The inferior portion appears to still be healthy and has reattached to some degree along the suture line this is still somewhat dehiscing and is can require some sharp debridement. Fortunately there does not appear to be any signs of active infection at this time.  With that being said I do believe that we may want to put him back on some preventative antibiotics, Keflex, just to ensure following the debridement today that he is going to be infection free. Released to the best of our ability. The patient does have a history  of end-stage renal disease that he is on dialysis. He also has schizophrenia at this point is now a ward of the state as it was deemed that he was not competent to be able to completely care for himself. Apparently his living condition prior to going to the group home was very poor. With that being said he seems to be doing much better now in my opinion and I do feel like that the location is that is a very good facility. The patient is going require some sharp debridement today and we did talk to [name] who is at the [facility] to gain approval for the patient. This was for debridement in particular. We did gain that approval and it was myself in [place] who witnessed this. Electronic Signature(s) Signed: 07/04/2020 5:50:56 PM By: Worthy Keeler PA-C Previous Signature: 07/04/2020 5:50:41 PM Version By: Worthy Keeler PA-C Entered By: Worthy Keeler on 07/04/2020 17:50:56 Lipsey, Uthman L. ([id number]) -------------------------------------------------------------------------------- Physical Exam Details Patient Name: Brown, [name] Date of Service: 07/04/2020 9:45 AM Medical Record Number: [id number] Patient Account Number: [id number] Date of Birth/Sex: May 09, 1961 (60 y.o. M) Treating RN: Dolan Amen Primary Care Provider: Dorisann Frames Other Clinician: Referring Provider: SUNG, JADE Treating Provider/Extender: [name] in Treatment: 0 Constitutional sitting or standing blood pressure is within target range for patient.. pulse regular and within target range for patient.Marland Kitchen respirations regular, non- labored and within target range for patient.Marland Kitchen temperature within target range for patient..  Well-nourished and well-hydrated in no acute distress. Eyes conjunctiva clear no eyelid edema noted. pupils equal round and reactive to light and accommodation. Ears, Nose, Mouth, and Throat no gross abnormality of ear auricles or external auditory canals. normal hearing noted during conversation. mucus membranes moist. Respiratory normal breathing without difficulty. Cardiovascular 2+ dorsalis pedis/posterior tibialis pulses. no clubbing, cyanosis, significant edema, <3 sec cap refill. Musculoskeletal unsteady while walking. Psychiatric this patient is able to make decisions and demonstrates good insight into disease process. Alert and Oriented x 3. pleasant and cooperative. Notes Upon inspection patient's wound unfortunately did have to have sutures removed I was able to remove actually 12 sutures in total. With that being said he did have a necrotic flap on the proximal portion of the wound bed that did have to be removed there was some hematoma down and some undermining towards the central portion of the wound. Along the inferior portion of the suture repair he also did have some dehiscence here and some hematoma noted I was able to clean this away as well. With that being said he did have some bleeding we were able to control this with pressure fortunately there is no signs of infection at this time. I am going to likely put him on some antibiotics to try to help keep this from becoming infected. The patient seems to have excellent blood flow based on what I am seeing. Electronic Signature(s) Signed: 07/04/2020 5:52:03 PM By: Worthy Keeler PA-C Entered By: Worthy Keeler on 07/04/2020 17:52:03 Orton, Huel L. ([id number]) -------------------------------------------------------------------------------- Physician Orders Details Patient Name: Varady, Thanh L. Date of Service: 07/04/2020 9:45 AM Medical Record Number: [id number] Patient Account Number: [id number] Date of Birth/Sex:  06-Sep-1960 (60 y.o. M) Treating RN: Dolan Amen Primary Care Provider: Dorisann Frames Other Clinician: Referring Provider: SUNG, JADE Treating Provider/Extender: [name] in Treatment: 0 Verbal / Phone Orders: No Diagnosis Coding ICD-10 Coding Code Description [phone] Unspecified open wound, left lower leg, initial encounter L97.822 Non-pressure  chronic ulcer of other part of left lower leg with fat layer exposed N18.6 End stage renal disease Z99.2 Dependence on renal dialysis F20.89 Other schizophrenia Follow-up Appointments Wound #1 Left,Anterior Lower Leg o Return Appointment in 1 week. Home Health Wound #1 [place]: [facility] o ADMIT to [facility] for wound care. May utilize formulary equivalent dressing for wound treatment orders unless otherwise specified. Home Health Nurse may visit PRN to address patientos wound care needs. o Scheduled days for dressing changes to be completed; exception, patient has scheduled wound care visit that day. - Dressing changes Monday, Wednesday, Friday o **Please direct any NON-WOUND related issues/requests for orders to patient's Primary Care Physician. **If current dressing causes regression in wound condition, may D/C ordered dressing product/s and apply Normal Saline Moist Dressing daily until next [place] or Other MD appointment. **Notify [facility] of regression in wound condition at [phone]. Anesthetic (Use 'Patient Medications' Section for Anesthetic Order Entry) Wound #1 Left,Anterior Lower Leg o Lidocaine applied to wound bed o Benzocaine applied to wound bed. Medications-Please add to medication list. Wound #1 Left,Anterior Lower Leg o P.O. Antibiotics Wound Treatment Wound #1 - Lower Leg Wound Laterality: Left, Anterior Cleanser: Normal Saline (Generic) 3 x Per Week/30 Days Discharge Instructions: Wash your hands with soap and water.  Remove old dressing, discard into plastic bag and place into trash. Cleanse the wound with Normal Saline prior to applying a clean dressing using gauze sponges, not tissues or cotton balls. Do not scrub or use excessive force. Pat dry using gauze sponges, not tissue or cotton balls. Primary Dressing: Silvercel 4 1/4x 4 1/4 (in/in) (Generic) 3 x Per Week/30 Days Discharge Instructions: Apply Silvercel 4 1/4x 4 1/4 (in/in) as instructed Secondary Dressing: Xtrasorb Large 6x9 (in/in) (Generic) 3 x Per Week/30 Days Discharge Instructions: Apply to wound as directed. Do not cut. Secured With: 64M Medipore H Soft Cloth Surgical Tape, 2x2 (in/yd) (Generic) 3 x Per Week/30 Days Secured With: [employer] Sterile or Non-Sterile 6-ply 4.5x4 (yd/yd) (Generic) 3 x Per Week/30 Days Discharge Instructions: Apply Kerlix as directed Secured With: Tubigrip Size E, 3.5x10 (in/yds) (Generic) 3 x Per Week/30 Days Lucero, Christ L. ([id number]) Discharge Instructions: Apply 3 Tubigrip E 3-finger-widths below knee to base of toes to secure dressing and/or for swelling. Patient Medications Allergies: No Known Allergies Notifications Medication Indication Start End Keflex 07/04/2020 DOSE 1 - oral 500 mg capsule - 1 capsule oral taken 3 times per day for 15 days Electronic Signature(s) Signed: 07/04/2020 11:39:15 AM By: Worthy Keeler PA-C Entered By: Worthy Keeler on 07/04/2020 11:39:15 Kopp, Jamario L. ([id number]) -------------------------------------------------------------------------------- Problem List Details Patient Name: Holleman, Dedrick L. Date of Service: 07/04/2020 9:45 AM Medical Record Number: [id number] Patient Account Number: [id number] Date of Birth/Sex: [birth year] (60 y.o. M) Treating RN: Dolan Amen Primary Care Provider: Dorisann Frames Other Clinician: Referring Provider: SUNG, JADE Treating Provider/Extender: [name] in Treatment: 0 Active Problems ICD-10 Encounter Code  Description Active Date MDM Diagnosis S81.802A Unspecified open wound, left lower leg, initial encounter 07/04/2020 No Yes 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 layer 07/04/2020 No Yes exposed N18.6 End stage renal disease 07/04/2020 No Yes Z99.2 Dependence on renal dialysis 07/04/2020 No Yes F20.89 Other schizophrenia 07/04/2020 No Yes Inactive Problems Resolved Problems Electronic Signature(s) Signed: 07/04/2020 10:59:29 AM By: Worthy Keeler PA-C Entered By: Worthy Keeler on 07/04/2020 10:59:28 Selsor, Elvert L. ([id number]) --------------------------------------------------------------------------------  Progress Note Details Patient Name: Hard, Bodin L. Date of Service: 07/04/2020 9:45 AM Medical Record Number: [id number] Patient Account Number: [id number] Date of Birth/Sex: 01-24-1961 (60 y.o. M) Treating RN: Dolan Amen Primary Care Provider: Dorisann Frames Other Clinician: Referring Provider: SUNG, JADE Treating Provider/Extender: [name] in Treatment: 0 Subjective Chief Complaint Information obtained from Patient Left LE Traumatic Ulcer History of Present Illness (HPI) 07/04/2020 patient presents today for initial evaluation here in our clinic. He is actually a ward of the state. With that being said he is only been in the group home that he is currently staying in for the past few weeks in fact it was only about 2 weeks. He was seen in the ED for evaluation on 06/18/2020 due to an injury that he sustained to his left lower leg. He tells me that he got up in the middle the night to go to the bathroom or something and tripped over a wheelchair causing a significant skin tear/laceration to the leg. Unfortunately this was sutured but has dehisced in some areas and appears to have a significant hematoma and others. The proximal portion of the wound has actually necrosis and the skin flap is can have to be removed. The inferior portion  appears to still be healthy and has reattached to some degree along the suture line this is still somewhat dehiscing and is can require some sharp debridement. Fortunately there does not appear to be any signs of active infection at this time. With that being said I do believe that we may want to put him back on some preventative antibiotics, Keflex, just to ensure following the debridement today that he is going to be infection free. Released to the best of our ability. The patient does have a history of end-stage renal disease that he is on dialysis. He also has schizophrenia at this point is now a ward of the state as it was deemed that he was not competent to be able to completely care for himself. Apparently his living condition prior to going to the group home was very poor. With that being said he seems to be doing much better now in my opinion and I do feel like that the location is that is a very good facility. The patient is going require some sharp debridement today and we did talk to [name] who is at the [facility] to gain approval for the patient. This was for debridement in particular. We did gain that approval and it was myself in [place] who witnessed this. Patient History Allergies No Known Allergies Social History Current every day smoker, Marital Status - Single, Alcohol Use - Never, Drug Use - No History, Caffeine Use - Daily. Medical History Eyes Denies history of Cataracts, Glaucoma, Optic Neuritis Ear/Nose/Mouth/Throat Denies history of Chronic sinus problems/congestion, Middle ear problems Hematologic/Lymphatic Denies history of Anemia, Hemophilia, Human Immunodeficiency Virus, Lymphedema, Sickle Cell Disease Respiratory Patient has history of Chronic Obstructive Pulmonary Disease (COPD) Denies history of Aspiration, Asthma, Pneumothorax, Sleep Apnea, Tuberculosis Cardiovascular Patient has history of Hypertension Denies history of Angina,  Arrhythmia, Congestive Heart Failure, Coronary Artery Disease, Deep Vein Thrombosis, Hypotension, Myocardial Infarction, Peripheral Arterial Disease, Peripheral Venous Disease, Phlebitis, Vasculitis Gastrointestinal Denies history of Cirrhosis , Colitis, Crohn s, Hepatitis A, Hepatitis B, Hepatitis C Endocrine Denies history of Type I Diabetes, Type II Diabetes Genitourinary Denies history of End Stage Renal Disease Immunological Denies history of Lupus Erythematosus, Raynaud s, Scleroderma Integumentary (Skin) Denies history of History  of Burn, History of pressure wounds Musculoskeletal Denies history of Gout, Rheumatoid Arthritis, Osteoarthritis, Osteomyelitis Neurologic Denies history of Dementia, Neuropathy, Quadriplegia, Paraplegia, Seizure Disorder Oncologic Denies history of Received Chemotherapy, Received Radiation Psychiatric Denies history of Anorexia/bulimia, Confinement Anxiety Review of Systems (ROS) Constitutional Symptoms (General Health) Denies complaints or symptoms of Fatigue, Fever, Chills, Marked Weight Change. Coven, Tiberius L. ([id number]) Eyes Denies complaints or symptoms of Dry Eyes, Vision Changes, Glasses / Contacts. Ear/Nose/Mouth/Throat Denies complaints or symptoms of Difficult clearing ears, Sinusitis. Hematologic/Lymphatic Denies complaints or symptoms of Bleeding / Clotting Disorders, Human Immunodeficiency Virus. Respiratory Denies complaints or symptoms of Chronic or frequent coughs, Shortness of Breath. Cardiovascular Denies complaints or symptoms of Chest pain, LE edema. Gastrointestinal Denies complaints or symptoms of Frequent diarrhea, Nausea, Vomiting. Endocrine Denies complaints or symptoms of Hepatitis, Thyroid disease, Polydypsia (Excessive Thirst). Genitourinary Complains or has symptoms of Kidney failure/ Dialysis. Denies complaints or symptoms of Incontinence/dribbling. Immunological Denies complaints or symptoms of Hives,  Itching. Integumentary (Skin) Complains or has symptoms of Wounds, Swelling. Denies complaints or symptoms of Bleeding or bruising tendency, Breakdown. Musculoskeletal Denies complaints or symptoms of Muscle Pain, Muscle Weakness. Neurologic Denies complaints or symptoms of Numbness/parasthesias, Focal/Weakness. Psychiatric Denies complaints or symptoms of Anxiety, Claustrophobia. Objective Constitutional sitting or standing blood pressure is within target range for patient.. pulse regular and within target range for patient.Marland Kitchen respirations regular, non- labored and within target range for patient.Marland Kitchen temperature within target range for patient.. Well-nourished and well-hydrated in no acute distress. Vitals Time Taken: 10:21 AM, Height: 75 in, Source: Stated, Weight: 190 lbs, Source: Stated, BMI: 23.7, Temperature: 98.3 F, Pulse: 80 bpm, Respiratory Rate: 18 breaths/min, Blood Pressure: 110/40 mmHg. Eyes conjunctiva clear no eyelid edema noted. pupils equal round and reactive to light and accommodation. Ears, Nose, Mouth, and Throat no gross abnormality of ear auricles or external auditory canals. normal hearing noted during conversation. mucus membranes moist. Respiratory normal breathing without difficulty. Cardiovascular 2+ dorsalis pedis/posterior tibialis pulses. no clubbing, cyanosis, significant edema, Musculoskeletal unsteady while walking. Psychiatric this patient is able to make decisions and demonstrates good insight into disease process. Alert and Oriented x 3. pleasant and cooperative. General Notes: Upon inspection patient's wound unfortunately did have to have sutures removed I was able to remove actually 12 sutures in total. With that being said he did have a necrotic flap on the proximal portion of the wound bed that did have to be removed there was some hematoma down and some undermining towards the central portion of the wound. Along the inferior portion of the suture  repair he also did have some dehiscence here and some hematoma noted I was able to clean this away as well. With that being said he did have some bleeding we were able to control this with pressure fortunately there is no signs of infection at this time. I am going to likely put him on some antibiotics to try to help keep this from becoming infected. The patient seems to have excellent blood flow based on what I am seeing. Integumentary (Hair, Skin) Wound #1 status is Open. Original cause of wound was Laceration. The wound is located on the Left,Anterior Lower Leg. The wound measures 12.5cm length x 5.5cm width x 0.4cm depth; 53.996cm^2 area and 21.598cm^3 volume. There is Fat Layer (Subcutaneous Tissue) exposed. There is no tunneling or undermining noted. There is a medium amount of serosanguineous drainage noted. There is small (1-33%) red granulation within the wound bed. There is a large (67-100%) amount of  necrotic tissue within the wound bed including Eschar and Adherent Swaby, Tresten L. ([id number]) Slough. Assessment Active Problems ICD-10 Unspecified open wound, left lower leg, initial encounter Non-pressure chronic ulcer of other part of left lower leg with fat layer exposed End stage renal disease Dependence on renal dialysis Other schizophrenia Procedures Wound #1 Pre-procedure diagnosis of Wound #1 is a Trauma, Other located on the Left,Anterior Lower Leg . There was a Excisional Skin/Subcutaneous Tissue Debridement with a total area of 38.5 sq cm performed by Tommie Sams., PA-C. With the following instrument(s): Curette, Forceps, and Scissors to remove Viable and Non-Viable tissue/material. Material removed includes Eschar, Subcutaneous Tissue, and Slough after achieving pain control using Other (benzocaine). A time out was conducted at 11:16, prior to the start of the procedure. A Minimum amount of bleeding was controlled with Pressure. The procedure was tolerated well.  Post Debridement Measurements: 12.5cm length x 5.5cm width x 0.6cm depth; 32.398cm^3 volume. Character of Wound/Ulcer Post Debridement is stable. Post procedure Diagnosis Wound #1: Same as Pre-Procedure Plan Follow-up Appointments: Wound #1 Left,Anterior Lower Leg: Return Appointment in 1 week. Home Health: Wound #1 Left,Anterior Lower Leg: [place]: [facility] AD[facility] to [facility] for wound care. May utilize formulary equivalent dressing for wound treatment orders unless otherwise specified. Home Health Nurse may visit PRN to address patient s wound care needs. Scheduled days for dressing changes to be completed; exception, patient has scheduled wound care visit that day. - Dressing changes Monday, Wednesday, Friday **Please direct any NON-WOUND related issues/requests for orders to patient's Primary Care Physician. **If current dressing causes regression in wound condition, may D/C ordered dressing product/s and apply Normal Saline Moist Dressing daily until next [place] or Other MD appointment. **Notify [facility] of regression in wound condition at [phone]. Anesthetic (Use 'Patient Medications' Section for Anesthetic Order Entry): Wound #1 Left,Anterior Lower Leg: Lidocaine applied to wound bed Benzocaine applied to wound bed. Medications-Please add to medication list.: Wound #1 Left,Anterior Lower Leg: P.O. Antibiotics The following medication(s) was prescribed: Keflex oral 500 mg capsule 1 1 capsule oral taken 3 times per day for 15 days starting 07/04/2020 WOUND #1: - Lower Leg Wound Laterality: Left, Anterior Cleanser: Normal Saline (Generic) 3 x Per Week/30 Days Discharge Instructions: Wash your hands with soap and water. Remove old dressing, discard into plastic bag and place into trash. Cleanse the wound with Normal Saline prior to applying a clean dressing using gauze sponges, not tissues or cotton balls. Do not scrub or use excessive  force. Pat dry using gauze sponges, not tissue or cotton balls. Primary Dressing: Silvercel 4 1/4x 4 1/4 (in/in) (Generic) 3 x Per Week/30 Days Discharge Instructions: Apply Silvercel 4 1/4x 4 1/4 (in/in) as instructed Secondary Dressing: Xtrasorb Large 6x9 (in/in) (Generic) 3 x Per Week/30 Days Discharge Instructions: Apply to wound as directed. Do not cut. Secured With: 64M Medipore H Soft Cloth Surgical Tape, 2x2 (in/yd) (Generic) 3 x Per Week/30 Days Secured With: [employer] Sterile or Non-Sterile 6-ply 4.5x4 (yd/yd) (Generic) 3 x Per Week/30 Days Discharge Instructions: Apply Kerlix as directed Petrakis, Demonte L. ([id number]) Secured With: Tubigrip Size E, 3.5x10 (in/yds) (Generic) 3 x Per Week/30 Days Discharge Instructions: Apply 3 Tubigrip E 3-finger-widths below knee to base of toes to secure dressing and/or for swelling. 1. Would recommend currently that we go ahead and initiate treatment with a silver alginate dressing. I think this is good to be the best way to go currently. 2. I am  also going to recommend that we have the patient start back on the Keflex and I Georgina Peer do this for the next 15 days. 3. I would also recommend we have home health come out and change his dressing recommend using XtraSorb over top of the silver alginate to help catch any drainage. 4. I am also can recommend Tubigrip in order to help with compression. We will be using this in lieu of a compression wrap at the moment. Hopefully by next week he will be showing some signs of improvement here although I do believe this is going to take quite a bit of time to completely heal just due to how deep the wound is. We will see patient back for reevaluation in 1 week here in the clinic. If anything worsens or changes patient will contact our office for additional recommendations. Electronic Signature(s) Signed: 07/04/2020 5:53:05 PM By: Worthy Keeler PA-C Entered By: Worthy Keeler on 07/04/2020 17:53:05 Lindler,  Tionne L. ([id number]) -------------------------------------------------------------------------------- ROS/PFSH Details Patient Name: Leccese, Cosmo L. Date of Service: 07/04/2020 9:45 AM Medical Record Number: [id number] Patient Account Number: [id number] Date of Birth/Sex: Jan 09, 1961 (60 y.o. M) Treating RN: Carlene Coria Primary Care Provider: Dorisann Frames Other Clinician: Referring Provider: SUNG, JADE Treating Provider/Extender: [name] in Treatment: 0 Constitutional Symptoms (General Health) Complaints and Symptoms: Negative for: Fatigue; Fever; Chills; Marked Weight Change Eyes Complaints and Symptoms: Negative for: Dry Eyes; Vision Changes; Glasses / Contacts Medical History: Negative for: Cataracts; Glaucoma; Optic Neuritis Ear/Nose/Mouth/Throat Complaints and Symptoms: Negative for: Difficult clearing ears; Sinusitis Medical History: Negative for: Chronic sinus problems/congestion; Middle ear problems Hematologic/Lymphatic Complaints and Symptoms: Negative for: Bleeding / Clotting Disorders; Human Immunodeficiency Virus Medical History: Negative for: Anemia; Hemophilia; Human Immunodeficiency Virus; Lymphedema; Sickle Cell Disease Respiratory Complaints and Symptoms: Negative for: Chronic or frequent coughs; Shortness of Breath Medical History: Positive for: Chronic Obstructive Pulmonary Disease (COPD) Negative for: Aspiration; Asthma; Pneumothorax; Sleep Apnea; Tuberculosis Cardiovascular Complaints and Symptoms: Negative for: Chest pain; LE edema Medical History: Positive for: Hypertension Negative for: Angina; Arrhythmia; Congestive Heart Failure; Coronary Artery Disease; Deep Vein Thrombosis; Hypotension; Myocardial Infarction; Peripheral Arterial Disease; Peripheral Venous Disease; Phlebitis; Vasculitis Gastrointestinal Complaints and Symptoms: Negative for: Frequent diarrhea; Nausea; Vomiting Medical History: Negative for: Cirrhosis ; Colitis;  Crohnos; Hepatitis A; Hepatitis B; Hepatitis C Endocrine Complaints and Symptoms: Negative for: Hepatitis; Thyroid disease; Polydypsia (Excessive Thirst) Medical History: Berberian, Antony L. ([id number]) Negative for: Type I Diabetes; Type II Diabetes Genitourinary Complaints and Symptoms: Positive for: Kidney failure/ Dialysis Negative for: Incontinence/dribbling Medical History: Negative for: End Stage Renal Disease Immunological Complaints and Symptoms: Negative for: Hives; Itching Medical History: Negative for: Lupus Erythematosus; Raynaudos; Scleroderma Integumentary (Skin) Complaints and Symptoms: Positive for: Wounds; Swelling Negative for: Bleeding or bruising tendency; Breakdown Medical History: Negative for: History of Burn; History of pressure wounds Musculoskeletal Complaints and Symptoms: Negative for: Muscle Pain; Muscle Weakness Medical History: Negative for: Gout; Rheumatoid Arthritis; Osteoarthritis; Osteomyelitis Neurologic Complaints and Symptoms: Negative for: Numbness/parasthesias; Focal/Weakness Medical History: Negative for: Dementia; Neuropathy; Quadriplegia; Paraplegia; Seizure Disorder Psychiatric Complaints and Symptoms: Negative for: Anxiety; Claustrophobia Medical History: Negative for: Anorexia/bulimia; Confinement Anxiety Oncologic Medical History: Negative for: Received Chemotherapy; Received Radiation Immunizations Pneumococcal Vaccine: Received Pneumococcal Vaccination: No Implantable Devices None Family and Social History Current every day smoker; Marital Status - Single; Alcohol Use: Never; Drug Use: No History; Caffeine Use: Daily; Financial Concerns: No; Food, Clothing or Shelter Needs: No; Support System Lacking: No; Transportation Concerns: No Homann, Hayk L. ([id number]) Electronic  Signature(s) Signed: 07/04/2020 4:28:47 PM By: Carlene Coria RN Signed: 07/04/2020 6:00:33 PM By: Worthy Keeler PA-C Entered By: Carlene Coria on  07/04/2020 10:45:11 Learn, Yosgart L. ([id number]) -------------------------------------------------------------------------------- SuperBill Details Patient Name: Doolan, Koal L. Date of Service: 07/04/2020 Medical Record Number: [id number] Patient Account Number: [id number] Date of Birth/Sex: [birth year] (60 y.o. M) Treating RN: Dolan Amen Primary Care Provider: Dorisann Frames Other Clinician: Referring Provider: SUNG, JADE Treating Provider/Extender: [name] in Treatment: 0 Diagnosis Coding ICD-10 Codes Code Description [phone] Unspecified open wound, left lower leg, initial encounter 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 layer exposed N18.6 End stage renal disease Z99.2 Dependence on renal dialysis F20.89 Other schizophrenia Facility Procedures CPT4 Code: [id number] Description: [facility] VISIT-LEV 3 EST PT Modifier: Quantity: 1 CPT4 Code: [id number] Description: [id number] - DEB SUBQ TISSUE 20 SQ CM/< Modifier: Quantity: 1 CPT4 Code: Description: ICD-10 Diagnosis Description 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 layer Modifier: exposed Quantity: Physician Procedures CPT4 Code: [id number] Description: WC PHYS LEVEL 3 o NEW PT Modifier: 25 Quantity: 1 CPT4 Code: Description: ICD-10 Diagnosis Description S81.802A Unspecified open wound, left lower leg, initial encounter 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 layer N18.6 End stage renal disease Z99.2 Dependence on renal  dialysis Modifier: exposed Quantity: CPT4 Code: [id number] Description: 11042 - WC PHYS SUBQ TISS 20 SQ CM Modifier: Quantity: 1 CPT4 Code: Description: ICD-10 Diagnosis Description 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

## 2020-07-04 NOTE — Progress Notes (Signed)
Lawniczak, Jayron L. ([id number]) Visit Report for 07/04/2020 Allergy List Details Patient Name: Todd Brown, [name] Date of Service: 07/04/2020 9:45 AM Medical Record Number: [id number] Patient Account Number: [id number] Date of Birth/Sex: 1961-03-31 (60 y.o. M) Treating Brown: Todd Brown Primary Care Todd Brown: Dorisann Frames Other Clinician: Referring Todd Brown: SUNG, Brown Treating Todd Brown/Extender: Todd Brown in Treatment: 0 Allergies Active Allergies No Known Allergies Type: Allergen Allergy Notes Electronic Signature(s) Signed: 07/04/2020 4:28:47 PM By: Todd Coria Brown Entered By: Todd Brown on 07/04/2020 10:42:52 Todd Brown, [name] ([id number]) -------------------------------------------------------------------------------- Arrival Information Details Patient Name: [name], [name] Date of Service: 07/04/2020 9:45 AM Medical Record Number: [id number] Patient Account Number: [id number] Date of Birth/Sex: 1960/08/03 (60 y.o. M) Treating Brown: Todd Brown Primary Care Todd Brown: Dorisann Frames Other Clinician: Referring Kathrynne Brown: SUNG, Brown Treating Matison Nuccio/Extender: Todd Brown in Treatment: 0 Visit Information Patient Arrived: Wheel Chair Arrival Time: 10:18 Accompanied By: caregiver Transfer Assistance: None Patient Identification Verified: Yes Secondary Verification Process Completed: Yes Patient Requires Transmission-Based Precautions: No Patient Has Alerts: No Electronic Signature(s) Signed: 07/04/2020 12:35:51 PM By: Todd Brown Entered By: Todd Mouse, Kenia on 07/04/2020 11:04:23 Todd Brown, [name] ([id number]) -------------------------------------------------------------------------------- Clinic Level of Care Assessment Details Patient Name: [name], [name] Date of Service: 07/04/2020 9:45 AM Medical Record Number: [id number] Patient Account Number: [id number] Date of Birth/Sex: [birth year] (60 y.o. M) Treating Brown: Todd Brown Primary Care Todd Brown: Dorisann Frames Other Clinician: Referring Todd Brown: SUNG, Brown Treating Todd Brown/Extender: Todd Brown in Treatment: 0 Clinic Level of Care Assessment Items TOOL 4 Quantity Score X - Use when only an EandM is performed on FOLLOW-UP visit 1 0 ASSESSMENTS - Nursing Assessment / Reassessment X - Reassessment of Co-morbidities (includes updates in patient status) 1 10 X- 1 5 Reassessment of Adherence to Treatment Plan ASSESSMENTS - Wound and Skin Assessment / Reassessment X - Simple Wound Assessment / Reassessment - one wound 1 5 '[]'$  - 0 Complex Wound Assessment / Reassessment - multiple wounds '[]'$  - 0 Dermatologic / Skin Assessment (not related to wound area) ASSESSMENTS - Focused Assessment '[]'$  - Circumferential Edema Measurements - multi extremities 0 '[]'$  - 0 Nutritional Assessment / Counseling / Intervention '[]'$  - 0 Lower Extremity Assessment (monofilament, tuning fork, pulses) '[]'$  - 0 Peripheral Arterial Disease Assessment (using hand held doppler) ASSESSMENTS - Ostomy and/or Continence Assessment and Care '[]'$  - Incontinence Assessment and Management 0 '[]'$  - 0 Ostomy Care Assessment and Management (repouching, etc.) PROCESS - Coordination of Care X - Simple Patient / Family Education for ongoing care 1 15 '[]'$  - 0 Complex (extensive) Patient / Family Education for ongoing care '[]'$  - 0 Staff obtains Programmer, systems, Records, Test Results / Process Orders '[]'$  - 0 Staff telephones HHA, Nursing Homes / Clarify orders / etc '[]'$  - 0 Routine Transfer to another Facility (non-emergent condition) '[]'$  - 0 Routine Hospital Admission (non-emergent condition) '[]'$  - 0 New Admissions / Biomedical engineer / Ordering NPWT, Apligraf, etc. '[]'$  - 0 Emergency Hospital Admission (emergent condition) X- 1 10 Simple Discharge Coordination '[]'$  - 0 Complex (extensive) Discharge Coordination PROCESS - Special Needs '[]'$  - Pediatric / Minor Patient Management 0 '[]'$  -  0 Isolation Patient Management '[]'$  - 0 Hearing / Language / Visual special needs '[]'$  - 0 Assessment of Community assistance (transportation, D/C planning, etc.) '[]'$  - 0 Additional assistance / Altered mentation '[]'$  - 0 Support Surface(s) Assessment (bed, cushion, seat, etc.) INTERVENTIONS - Wound Cleansing / Measurement Todd Brown, [name] ([id number]) X- 1 5 Simple Wound Cleansing -  one wound '[]'$  - 0 Complex Wound Cleansing - multiple wounds X- 1 5 Wound Imaging (photographs - any number of wounds) '[]'$  - 0 Wound Tracing (instead of photographs) X- 1 5 Simple Wound Measurement - one wound '[]'$  - 0 Complex Wound Measurement - multiple wounds INTERVENTIONS - Wound Dressings '[]'$  - Small Wound Dressing one or multiple wounds 0 '[]'$  - 0 Medium Wound Dressing one or multiple wounds X- 1 20 Large Wound Dressing one or multiple wounds '[]'$  - 0 Application of Medications - topical '[]'$  - 0 Application of Medications - injection INTERVENTIONS - Miscellaneous '[]'$  - External ear exam 0 '[]'$  - 0 Specimen Collection (cultures, biopsies, blood, body fluids, etc.) '[]'$  - 0 Specimen(s) / Culture(s) sent or taken to Lab for analysis '[]'$  - 0 Patient Transfer (multiple staff / Civil Service fast streamer / Similar devices) X- 1 5 Simple Staple / Suture removal (25 or less) '[]'$  - 0 Complex Staple / Suture removal (26 or more) '[]'$  - 0 Hypo / Hyperglycemic Management (close monitor of Blood Glucose) '[]'$  - 0 Ankle / Brachial Index (ABI) - do not check if billed separately X- 1 5 Vital Signs Has the patient been seen at the hospital within the last three years: Yes Total Score: 90 Level Of Care: New/Established - Level 3 Electronic Signature(s) Signed: 07/04/2020 12:35:51 PM By: Todd Brown Entered By: Todd Mouse, Kenia on 07/04/2020 11:26:19 Todd Brown, [name] ([id number]) -------------------------------------------------------------------------------- Encounter Discharge Information Details Patient Name:  [name], [name] Date of Service: 07/04/2020 9:45 AM Medical Record Number: [id number] Patient Account Number: [id number] Date of Birth/Sex: Oct 13, 1960 (60 y.o. M) Treating Brown: Todd Brown Primary Care Antwoin Lackey: Dorisann Frames Other Clinician: Referring Chukwudi Ewen: SUNG, Brown Treating Breylon Sherrow/Extender: Todd Brown in Treatment: 0 Encounter Discharge Information Items Post Procedure Vitals Discharge Condition: Stable Temperature (F): 98.3 Ambulatory Status: Ambulatory Pulse (bpm): 80 Discharge Destination: Home Respiratory Rate (breaths/min): 18 Transportation: Private Auto Blood Pressure (mmHg): 110/40 Accompanied By: caregiver Schedule Follow-up Appointment: Yes Clinical Summary of Care: Electronic Signature(s) Signed: 07/04/2020 12:35:51 PM By: Todd Brown Entered By: Todd Mouse, Kenia on 07/04/2020 11:28:02 Todd Brown, [name] ([id number]) -------------------------------------------------------------------------------- Lower Extremity Assessment Details Patient Name: Pytel, Nakul L. Date of Service: 07/04/2020 9:45 AM Medical Record Number: [id number] Patient Account Number: [id number] Date of Birth/Sex: 07-19-1960 (60 y.o. M) Treating Brown: Todd Brown Primary Care Laurie Penado: Dorisann Frames Other Clinician: Referring Anitra Doxtater: SUNG, Brown Treating Petina Muraski/Extender: Todd Brown in Treatment: 0 Edema Assessment Assessed: [Left: No] [Right: No] Edema: [Left: Ye] [Right: s] Calf Left: Right: Point of Measurement: 47 cm From Medial Instep 36.5 cm Ankle Left: Right: Point of Measurement: 10 cm From Medial Instep 22.3 cm Knee To Floor Left: Right: From Medial Instep 51 cm Vascular Assessment Pulses: Dorsalis Pedis Palpable: [Left:No] Electronic Signature(s) Signed: 07/04/2020 4:28:47 PM By: Todd Coria Brown Entered By: Todd Brown on 07/04/2020 10:42:30 Evett, Wake L.  ([id number]) -------------------------------------------------------------------------------- Multi Wound Chart Details Patient Name: Carton, Zade L. Date of Service: 07/04/2020 9:45 AM Medical Record Number: [id number] Patient Account Number: [id number] Date of Birth/Sex: 1961-03-05 (60 y.o. M) Treating Brown: Todd Brown Primary Care Warnie Belair: Dorisann Frames Other Clinician: Referring Ara Mano: SUNG, Brown Treating Telsa Dillavou/Extender: Todd Brown in Treatment: 0 Vital Signs Height(in): 75 Pulse(bpm): 80 Weight(lbs): 190 Blood Pressure(mmHg): 110/40 Body Mass Index(BMI): 24 Temperature(F): 98.3 Respiratory Rate(breaths/min): 18 Photos: [N/A:N/A] Wound Location: Left, Anterior Lower Leg N/A N/A Wounding Event: Laceration N/A N/A Primary Etiology: Trauma, Other N/A N/A Date Acquired: 06/18/2020 N/A N/A Weeks of  Treatment: 0 N/A N/A Wound Status: Open N/A N/A Measurements L x W x D (cm) 12.5x5.5x0.4 N/A N/A Area (cm) : 53.996 N/A N/A Volume (cm) : 21.598 N/A N/A Classification: Full Thickness Without Exposed N/A N/A Support Structures Exudate Amount: Medium N/A N/A Exudate Type: Serosanguineous N/A N/A Exudate Color: red, brown N/A N/A Granulation Amount: Small (1-33%) N/A N/A Granulation Quality: Red N/A N/A Necrotic Amount: Large (67-100%) N/A N/A Necrotic Tissue: Eschar, Adherent Slough N/A N/A Exposed Structures: Fat Layer (Subcutaneous Tissue): N/A N/A Yes Fascia: No Tendon: No Muscle: No Joint: No Bone: No Epithelialization: None N/A N/A Treatment Notes Electronic Signature(s) Signed: 07/04/2020 12:35:51 PM By: Todd Brown Entered By: Todd Mouse, Kenia on 07/04/2020 11:06:39 Todd Brown, [name] ([id number]) -------------------------------------------------------------------------------- Multi-Disciplinary Care Plan Details Patient Name: Vaillancourt, Shayde L. Date of Service: 07/04/2020 9:45 AM Medical Record Number: [id number] Patient  Account Number: [id number] Date of Birth/Sex: 22-Jun-1960 (60 y.o. M) Treating Brown: Todd Brown Primary Care Polo Mcmartin: Dorisann Frames Other Clinician: Referring Spiros Greenfeld: SUNG, Brown Treating Efrain Clauson/Extender: Todd Brown in Treatment: 0 Active Inactive Orientation to the Wound Care Program Nursing Diagnoses: Knowledge deficit related to the wound healing center program Goals: Patient/caregiver will verbalize understanding of the [facility] Date Initiated: 07/04/2020 Target Resolution Date: 07/04/2020 Goal Status: Active Interventions: Provide education on orientation to the wound center Notes: Wound/Skin Impairment Nursing Diagnoses: Impaired tissue integrity Goals: Patient/caregiver will verbalize understanding of skin care regimen Date Initiated: 07/04/2020 Target Resolution Date: 07/04/2020 Goal Status: Active Ulcer/skin breakdown will have a volume reduction of 30% by week 4 Date Initiated: 07/04/2020 Target Resolution Date: 08/01/2020 Goal Status: Active Ulcer/skin breakdown will have a volume reduction of 50% by week 8 Date Initiated: 07/04/2020 Target Resolution Date: 09/01/2020 Goal Status: Active Interventions: Assess patient/caregiver ability to obtain necessary supplies Assess patient/caregiver ability to perform ulcer/skin care regimen upon admission and as needed Assess ulceration(s) every visit Treatment Activities: Skin care regimen initiated : 07/04/2020 Topical wound management initiated : 07/04/2020 Notes: Electronic Signature(s) Signed: 07/04/2020 12:35:51 PM By: Todd Brown Entered By: Todd Mouse, Minus Breeding on 07/04/2020 11:05:52 Todd Brown, [name] ([id number]) -------------------------------------------------------------------------------- Pain Assessment Details Patient Name: Todd Brown, [name] Date of Service: 07/04/2020 9:45 AM Medical Record Number: [id number] Patient Account Number: [id number] Date of Birth/Sex: July 12, 1960  (60 y.o. M) Treating Brown: Todd Brown Primary Care Tashonna Descoteaux: Dorisann Frames Other Clinician: Referring Kelsie Zaborowski: SUNG, Brown Treating Anadalay Macdonell/Extender: Todd Brown in Treatment: 0 Active Problems Location of Pain Severity and Description of Pain Patient Has Paino Yes Site Locations With Dressing Change: Yes Duration of the Pain. Constant / Intermittento Intermittent How Long Does it Lasto Hours: Minutes: 15 Rate the pain. Current Pain Level: 4 Worst Pain Level: 7 Least Pain Level: 0 Tolerable Pain Level: 5 Character of Pain Describe the Pain: Aching, Burning Pain Management and Medication Current Pain Management: Medication: Yes Cold Application: No Rest: Yes Massage: No Activity: No T.E.N.S.: No Heat Application: No Leg drop or elevation: No Is the Current Pain Management Adequate: Inadequate How does your wound impact your activities of daily livingo Sleep: Yes Bathing: No Appetite: No Relationship With Others: No Bladder Continence: No Emotions: No Bowel Continence: No Work: No Toileting: No Drive: No Dressing: No Hobbies: No Electronic Signature(s) Signed: 07/04/2020 4:28:47 PM By: Todd Coria Brown Entered By: Todd Brown on 07/04/2020 10:20:59 Todd Brown, [name] ([id number]) -------------------------------------------------------------------------------- Patient/Caregiver Education Details Patient Name: Todd Brown, [name] Date of Service: 07/04/2020 9:45 AM Medical Record Number: [id number] Patient Account Number: [id number]  Date of Birth/Gender: 01/21/1961 (60 y.o. M) Treating Brown: Todd Brown Primary Care Physician: Dorisann Frames Other Clinician: Referring Physician: SUNG, Brown Treating Physician/Extender: Todd Brown in Treatment: 0 Education Assessment Education Provided To: Caregiver Education Topics Provided Welcome To [facility]: Methods: Explain/Verbal Responses: Return demonstration correctly Wound/Skin  Impairment: Methods: Demonstration, Explain/Verbal Responses: Return demonstration correctly Electronic Signature(s) Signed: 07/04/2020 12:35:51 PM By: Todd Brown Entered By: Todd Mouse, Minus Breeding on 07/04/2020 11:27:07 Todd Brown, [name] ([id number]) -------------------------------------------------------------------------------- Wound Assessment Details Patient Name: Todd Brown, [name] Date of Service: 07/04/2020 9:45 AM Medical Record Number: [id number] Patient Account Number: [id number] Date of Birth/Sex: 25-May-1961 (60 y.o. M) Treating Brown: Todd Brown Primary Care Tevan Marian: Dorisann Frames Other Clinician: Referring Alize Borrayo: SUNG, Brown Treating Renner Sebald/Extender: Todd Brown in Treatment: 0 Wound Status Wound Number: 1 Primary Etiology: Trauma, Other Wound Location: Left, Anterior Lower Leg Wound Status: Open Wounding Event: Laceration Date Acquired: 06/18/2020 Weeks Of Treatment: 0 Clustered Wound: No Photos Wound Measurements Length: (cm) 12.5 Width: (cm) 5.5 Depth: (cm) 0.4 Area: (cm) 53.996 Volume: (cm) 21.598 % Reduction in Area: % Reduction in Volume: Epithelialization: None Tunneling: No Undermining: No Wound Description Classification: Full Thickness Without Exposed Support Structures Exudate Amount: Medium Exudate Type: Serosanguineous Exudate Color: red, brown Foul Odor After Cleansing: No Slough/Fibrino Yes Wound Bed Granulation Amount: Small (1-33%) Exposed Structure Granulation Quality: Red Fascia Exposed: No Necrotic Amount: Large (67-100%) Fat Layer (Subcutaneous Tissue) Exposed: Yes Necrotic Quality: Eschar, Adherent Slough Tendon Exposed: No Muscle Exposed: No Joint Exposed: No Bone Exposed: No Treatment Notes Wound #1 (Lower Leg) Wound Laterality: Left, Anterior Cleanser Normal Saline Discharge Instruction: Wash your hands with soap and water. Remove old dressing, discard into plastic bag and place into  trash. Cleanse the wound with Normal Saline prior to applying a clean dressing using gauze sponges, not tissues or cotton balls. Do not scrub or use excessive force. Pat dry using gauze sponges, not tissue or cotton balls. Todd Brown, Marlen L. ([id number]) Peri-Wound Care Topical Primary Dressing Silvercel 4 1/4x 4 1/4 (in/in) Discharge Instruction: Apply Silvercel 4 1/4x 4 1/4 (in/in) as instructed Secondary Dressing Xtrasorb Large 6x9 (in/in) Discharge Instruction: Apply to wound as directed. Do not cut. Secured With 60M Medipore H Soft Cloth Surgical Tape, 2x2 (in/yd) Kerlix Roll Sterile or Non-Sterile 6-ply 4.5x4 (yd/yd) Discharge Instruction: Apply Kerlix as directed Tubigrip Size E, 3.5x10 (in/yds) Discharge Instruction: Apply 3 Tubigrip E 3-finger-widths below knee to base of toes to secure dressing and/or for swelling. Compression Wrap Compression Stockings Add-Ons Electronic Signature(s) Signed: 07/04/2020 4:28:47 PM By: Todd Coria Brown Entered By: Todd Brown on 07/04/2020 10:41:09 Noda, Mohan L. ([id number]) -------------------------------------------------------------------------------- Vitals Details Patient Name: Guinyard, Ruari L. Date of Service: 07/04/2020 9:45 AM Medical Record Number: [id number] Patient Account Number: [id number] Date of Birth/Sex: Jan 08, 1961 (60 y.o. M) Treating Brown: Todd Brown Primary Care Elman Dettman: Dorisann Frames Other Clinician: Referring Niyati Heinke: SUNG, Brown Treating Dannisha Eckmann/Extender: Todd Brown in Treatment: 0 Vital Signs Time Taken: 10:21 Temperature (F): 98.3 Height (in): 75 Pulse (bpm): 80 Source: Stated Respiratory Rate (breaths/min): 18 Weight (lbs): 190 Blood Pressure (mmHg): 110/40 Source: Stated Reference Range: 80 - 120 mg / dl Body Mass Index (BMI): 23.7 Electronic Signature(s) Signed: 07/04/2020 4:28:47 PM By: Todd Coria Brown Entered By: Todd Brown on 07/04/2020 10:22:04

## 2020-07-11 ENCOUNTER — Encounter: Payer: Medicaid Other | Admitting: Physician Assistant

## 2020-07-11 ENCOUNTER — Other Ambulatory Visit: Payer: Self-pay

## 2020-07-11 DIAGNOSIS — L97822 Non-pressure chronic ulcer of other part of left lower leg with fat layer exposed: Secondary | ICD-10-CM | POA: Diagnosis not present

## 2020-07-11 NOTE — Progress Notes (Addendum)
Pantoja, Briton L. ([id number]) Visit Report for 07/11/2020 Chief Complaint Document Details Patient Name: [name], [name] Date of Service: 07/11/2020 1:30 PM Medical Record Number: [id number] Patient Account Number: [id number] Date of Birth/Sex: 09-11-1960 (60 y.o. M) Treating RN: Dolan Amen Primary Care Provider: Dorisann Frames Other Clinician: Referring Provider: Dorisann Frames Treating Provider/Extender: [name] in Treatment: 1 Information Obtained from: Patient Chief Complaint Left LE Traumatic Ulcer Electronic Signature(s) Signed: 07/11/2020 1:41:30 PM By: Worthy Keeler PA-C Entered By: Worthy Keeler on 07/11/2020 13:41:30 Avilla, Farren L. ([id number]) -------------------------------------------------------------------------------- HPI Details Patient Name: [name], [name] Date of Service: 07/11/2020 1:30 PM Medical Record Number: [id number] Patient Account Number: [id number] Date of Birth/Sex: 1960/11/05 (60 y.o. M) Treating RN: Dolan Amen Primary Care Provider: Dorisann Frames Other Clinician: Referring Provider: Dorisann Frames Treating Provider/Extender: [name] in Treatment: 1 History of Present Illness HPI Description: 07/04/2020 patient presents today for initial evaluation here in our clinic. He is actually a ward of the state. With that being said he is only been in the group home that he is currently staying in for the past few weeks in fact it was only about 2 weeks. He was seen in the ED for evaluation on 06/18/2020 due to an injury that he sustained to his left lower leg. He tells me that he got up in the middle the night to go to the bathroom or something and tripped over a wheelchair causing a significant skin tear/laceration to the leg. Unfortunately this was sutured but has dehisced in some areas and appears to have a significant hematoma and others. The proximal portion of the wound has actually necrosis and the skin flap is  can have to be removed. The inferior portion appears to still be healthy and has reattached to some degree along the suture line this is still somewhat dehiscing and is can require some sharp debridement. Fortunately there does not appear to be any signs of active infection at this time. With that being said I do believe that we may want to put him back on some preventative antibiotics, Keflex, just to ensure following the debridement today that he is going to be infection free. Released to the best of our ability. The patient does have a history of end-stage renal disease that he is on dialysis. He also has schizophrenia at this point is now a ward of the state as it was deemed that he was not competent to be able to completely care for himself. Apparently his living condition prior to going to the group home was very poor. With that being said he seems to be doing much better now in my opinion and I do feel like that the location is that is a very good facility. The patient is going require some sharp debridement today and we did talk to [name] who is at the [facility] to gain approval for the patient. This was for debridement in particular. We did gain that approval and it was myself in [place] who witnessed this. Upon evaluation today patient appears to be doing a little better in regard to his wound currently. Fortunately there is no signs of active infection at this time. No fevers, chills, nausea, vomiting, or diarrhea. He has been taking the Keflex without complication. With that being said I am concerned today that he did develop a blood clot under the skin flap that skin to prevent him healing. I think we need to do something to try  to prevent this from continuing to be an issue. For that reason I discussed with him the possibility of doing a Unna boot wrap which I think it was going to be the best thing for him as far as healing is concerned he is in agreement with  the plan. Electronic Signature(s) Signed: 07/11/2020 1:58:43 PM By: Worthy Keeler PA-C Entered By: Worthy Keeler on 07/11/2020 13:58:42 Wynn, Chord L. ([id number]) -------------------------------------------------------------------------------- Physical Exam Details Patient Name: [name], [name] Date of Service: 07/11/2020 1:30 PM Medical Record Number: [id number] Patient Account Number: [id number] Date of Birth/Sex: [birth year] (60 y.o. M) Treating RN: Dolan Amen Primary Care Provider: Dorisann Frames Other Clinician: Referring Provider: Dorisann Frames Treating Provider/Extender: [name] in Treatment: 1 Constitutional Well-nourished and well-hydrated in no acute distress. Respiratory normal breathing without difficulty. Psychiatric this patient is able to make decisions and demonstrates good insight into disease process. Alert and Oriented x 3. pleasant and cooperative. Notes Upon inspection patient's wound bed actually showed signs of good granulation at this time. There does not appear to be any evidence of infection currently which is great news and overall I am extremely pleased with where things stand today. No fevers, chills, nausea, vomiting, or diarrhea. I did not have to perform any sharp debridement today which is great news. Overall I think that we will continue to monitor and see how things progress over the next couple of weeks I do want to see him week to week however. Electronic Signature(s) Signed: 07/11/2020 1:59:24 PM By: Worthy Keeler PA-C Entered By: Worthy Keeler on 07/11/2020 13:59:24 Beadnell, Arthur L. ([id number]) -------------------------------------------------------------------------------- Physician Orders Details Patient Name: [name], [name] Date of Service: 07/11/2020 1:30 PM Medical Record Number: [id number] Patient Account Number: [id number] Date of Birth/Sex: Oct 29, 1960 (60 y.o. M) Treating RN: Dolan Amen Primary  Care Provider: Dorisann Frames Other Clinician: Referring Provider: Dorisann Frames Treating Provider/Extender: [name] in Treatment: 1 Verbal / Phone Orders: No Diagnosis Coding ICD-10 Coding Code Description [phone] Unspecified open wound, left lower leg, initial encounter 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 layer exposed N18.6 End stage renal disease Z99.2 Dependence on renal dialysis F20.89 Other schizophrenia Follow-up Appointments Wound #1 Left,Anterior Lower Leg o Return Appointment in 1 week. Home Health Wound #1 [facility]: [facility] o ADMIT to [facility] for wound care. May utilize formulary equivalent dressing for wound treatment orders unless otherwise specified. Home Health Nurse may visit PRN to address patientos wound care needs. o Scheduled days for dressing changes to be completed; exception, patient has scheduled wound care visit that day. - Dressing changes Tuesday/Thursday in office/Saturday o **Please direct any NON-WOUND related issues/requests for orders to patient's Primary Care Physician. **If current dressing causes regression in wound condition, may D/C ordered dressing product/s and apply Normal Saline Moist Dressing daily until next [place] or Other MD appointment. **Notify [facility] of regression in wound condition at [phone]. Anesthetic (Use 'Patient Medications' Section for Anesthetic Order Entry) Wound #1 Left,Anterior Lower Leg o Lidocaine applied to wound bed o Benzocaine applied to wound bed. Medications-Please add to medication list. Wound #1 Left,Anterior Lower Leg o P.O. Antibiotics - Finish antibiotics Wound Treatment Wound #1 - Lower Leg Wound Laterality: Left, Anterior Cleanser: Normal Saline 3 x Per Week/30 Days Discharge Instructions: Wash your hands with soap and water. Remove old dressing, discard into  plastic bag and place into trash. Cleanse the  wound with Normal Saline prior to applying a clean dressing using gauze sponges, not tissues or cotton balls. Do not scrub or use excessive force. Pat dry using gauze sponges, not tissue or cotton balls. Cleanser: Wound Cleanser 3 x Per Week/30 Days Discharge Instructions: Wash your hands with soap and water. Remove old dressing, discard into plastic bag and place into trash. Cleanse the wound with Wound Cleanser prior to applying a clean dressing using gauze sponges, not tissues or cotton balls. Do not scrub or use excessive force. Pat dry using gauze sponges, not tissue or cotton balls. Primary Dressing: Silvercel 4 1/4x 4 1/4 (in/in) 3 x Per Week/30 Days Discharge Instructions: Tuck Silvercel wound bed/undermining region Secondary Dressing: Xtrasorb Large 6x9 (in/in) 3 x Per Week/30 Days Discharge Instructions: Apply to wound as directed. Do not cut. Compression Wrap: Unna Boot 4x10 (in/yd) 3 x Per Week/30 Days Muhs, Jamarrion L. ([id number]) Discharge Instructions: Louretta Parma Paste, Kerlix and Coban from base of toes to three finger widths below bend in knee. Electronic Signature(s) Signed: 07/11/2020 3:10:49 PM By: Georges Mouse, Minus Breeding RN Signed: 07/11/2020 4:44:32 PM By: Worthy Keeler PA-C Entered By: Georges Mouse, Minus Breeding on 07/11/2020 13:55:01 Pindell, Ashe L. ([id number]) -------------------------------------------------------------------------------- Problem List Details Patient Name: Vincent, Exavier L. Date of Service: 07/11/2020 1:30 PM Medical Record Number: [id number] Patient Account Number: [id number] Date of Birth/Sex: Jul 22, 1960 (60 y.o. M) Treating RN: Dolan Amen Primary Care Provider: Dorisann Frames Other Clinician: Referring Provider: Dorisann Frames Treating Provider/Extender: [name] in Treatment: 1 Active Problems ICD-10 Encounter Code Description Active Date MDM Diagnosis S81.802A Unspecified open  wound, left lower leg, initial encounter 07/04/2020 No Yes 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 layer 07/04/2020 No Yes exposed N18.6 End stage renal disease 07/04/2020 No Yes Z99.2 Dependence on renal dialysis 07/04/2020 No Yes F20.89 Other schizophrenia 07/04/2020 No Yes Inactive Problems Resolved Problems Electronic Signature(s) Signed: 07/11/2020 1:41:24 PM By: Worthy Keeler PA-C Entered By: Worthy Keeler on 07/11/2020 13:41:24 Anglada, Nazaiah L. ([id number]) -------------------------------------------------------------------------------- Progress Note Details Patient Name: Coxe, Kentavious L. Date of Service: 07/11/2020 1:30 PM Medical Record Number: [id number] Patient Account Number: [id number] Date of Birth/Sex: 1961-01-10 (60 y.o. M) Treating RN: Dolan Amen Primary Care Provider: Dorisann Frames Other Clinician: Referring Provider: Dorisann Frames Treating Provider/Extender: [name] in Treatment: 1 Subjective Chief Complaint Information obtained from Patient Left LE Traumatic Ulcer History of Present Illness (HPI) 07/04/2020 patient presents today for initial evaluation here in our clinic. He is actually a ward of the state. With that being said he is only been in the group home that he is currently staying in for the past few weeks in fact it was only about 2 weeks. He was seen in the ED for evaluation on 06/18/2020 due to an injury that he sustained to his left lower leg. He tells me that he got up in the middle the night to go to the bathroom or something and tripped over a wheelchair causing a significant skin tear/laceration to the leg. Unfortunately this was sutured but has dehisced in some areas and appears to have a significant hematoma and others. The proximal portion of the wound has actually necrosis and the skin flap is can have to be removed. The inferior portion appears to still be healthy and has reattached to some degree  along the suture line this is still somewhat dehiscing and is can require some sharp debridement. Fortunately there does not appear to be any  signs of active infection at this time. With that being said I do believe that we may want to put him back on some preventative antibiotics, Keflex, just to ensure following the debridement today that he is going to be infection free. Released to the best of our ability. The patient does have a history of end-stage renal disease that he is on dialysis. He also has schizophrenia at this point is now a ward of the state as it was deemed that he was not competent to be able to completely care for himself. Apparently his living condition prior to going to the group home was very poor. With that being said he seems to be doing much better now in my opinion and I do feel like that the location is that is a very good facility. The patient is going require some sharp debridement today and we did talk to [name] who is at the [facility] to gain approval for the patient. This was for debridement in particular. We did gain that approval and it was myself in [place] who witnessed this. Upon evaluation today patient appears to be doing a little better in regard to his wound currently. Fortunately there is no signs of active infection at this time. No fevers, chills, nausea, vomiting, or diarrhea. He has been taking the Keflex without complication. With that being said I am concerned today that he did develop a blood clot under the skin flap that skin to prevent him healing. I think we need to do something to try to prevent this from continuing to be an issue. For that reason I discussed with him the possibility of doing a Unna boot wrap which I think it was going to be the best thing for him as far as healing is concerned he is in agreement with the plan. Objective Constitutional Well-nourished and well-hydrated in no acute distress. Vitals Time  Taken: 1:31 PM, Height: 75 in, Weight: 190 lbs, BMI: 23.7, Temperature: 98.4 F, Pulse: 79 bpm, Respiratory Rate: 18 breaths/min, Blood Pressure: 116/66 mmHg. Respiratory normal breathing without difficulty. Psychiatric this patient is able to make decisions and demonstrates good insight into disease process. Alert and Oriented x 3. pleasant and cooperative. General Notes: Upon inspection patient's wound bed actually showed signs of good granulation at this time. There does not appear to be any evidence of infection currently which is great news and overall I am extremely pleased with where things stand today. No fevers, chills, nausea, vomiting, or diarrhea. I did not have to perform any sharp debridement today which is great news. Overall I think that we will continue to monitor and see how things progress over the next couple of weeks I do want to see him week to week however. Integumentary (Hair, Skin) Wound #1 status is Open. Original cause of wound was Laceration. The wound is located on the Left,Anterior Lower Leg. The wound measures 11.5cm length x 5.5cm width x 0.5cm depth; 49.676cm^2 area and 24.838cm^3 volume. There is Fat Layer (Subcutaneous Tissue) exposed. There is no tunneling or undermining noted. There is a medium amount of serosanguineous drainage noted. There is small (1-33%) red granulation within the wound bed. There is a large (67-100%) amount of necrotic tissue within the wound bed including Eschar and Adherent Slough. Mckinzie, Andrews L. ([id number]) Assessment Active Problems ICD-10 Unspecified open wound, left lower leg, initial encounter Non-pressure chronic ulcer of other part of left lower leg with fat layer exposed End stage renal disease Dependence  on renal dialysis Other schizophrenia Procedures Wound #1 Pre-procedure diagnosis of Wound #1 is a Trauma, Other located on the Left,Anterior Lower Leg . There was a Haematologist Compression Therapy Procedure by  Dolan Amen, RN. Post procedure Diagnosis Wound #1: Same as Pre-Procedure Plan Follow-up Appointments: Wound #1 Left,Anterior Lower Leg: Return Appointment in 1 week. Home Health: Wound #1 Left,Anterior Lower Leg: [facility]: [facility] ADMIT to [facility] for wound care. May utilize formulary equivalent dressing for wound treatment orders unless otherwise specified. Home Health Nurse may visit PRN to address patient s wound care needs. Scheduled days for dressing changes to be completed; exception, patient has scheduled wound care visit that day. - Dressing changes Tuesday/Thursday in office/Saturday **Please direct any NON-WOUND related issues/requests for orders to patient's Primary Care Physician. **If current dressing causes regression in wound condition, may D/C ordered dressing product/s and apply Normal Saline Moist Dressing daily until next [place] or Other MD appointment. **Notify [facility] of regression in wound condition at [phone]. Anesthetic (Use 'Patient Medications' Section for Anesthetic Order Entry): Wound #1 Left,Anterior Lower Leg: Lidocaine applied to wound bed Benzocaine applied to wound bed. Medications-Please add to medication list.: Wound #1 Left,Anterior Lower Leg: P.O. Antibiotics - Finish antibiotics WOUND #1: - Lower Leg Wound Laterality: Left, Anterior Cleanser: Normal Saline 3 x Per Week/30 Days Discharge Instructions: Wash your hands with soap and water. Remove old dressing, discard into plastic bag and place into trash. Cleanse the wound with Normal Saline prior to applying a clean dressing using gauze sponges, not tissues or cotton balls. Do not scrub or use excessive force. Pat dry using gauze sponges, not tissue or cotton balls. Cleanser: Wound Cleanser 3 x Per Week/30 Days Discharge Instructions: Wash your hands with soap and water. Remove old dressing, discard into plastic bag and place into trash. Cleanse  the wound with Wound Cleanser prior to applying a clean dressing using gauze sponges, not tissues or cotton balls. Do not scrub or use excessive force. Pat dry using gauze sponges, not tissue or cotton balls. Primary Dressing: Silvercel 4 1/4x 4 1/4 (in/in) 3 x Per Week/30 Days Discharge Instructions: Tuck Silvercel wound bed/undermining region Secondary Dressing: Xtrasorb Large 6x9 (in/in) 3 x Per Week/30 Days Discharge Instructions: Apply to wound as directed. Do not cut. Compression Wrap: Unna Boot 4x10 (in/yd) 3 x Per Week/30 Days Discharge Instructions: Unna Paste, Kerlix and Coban from base of toes to three finger widths below bend in knee. 1. Would recommend currently that the patient go ahead and continue with the silver alginate dressing for the time being I think that is doing a good job. We will also use XtraSorb over top of this. With that being said I do think we need some compression here. 2. I would recommend an Unna boot wrap just to start with and see how he tolerates this. I think that it will help with some compression and at [place], Lyndel L. ([id number]) the same time should help with hopefully getting the skin flap to reattach I think that is her biggest concern at the moment. I do not needed to continue to develop a blood clot underneath this region. We will see patient back for reevaluation in 1 week here in the clinic. If anything worsens or changes patient will contact our office for additional recommendations. Electronic Signature(s) Signed: 07/11/2020 2:00:14 PM By: Worthy Keeler PA-C Entered By: Worthy Keeler on 07/11/2020 14:00:14 Lando, Trent L. ([id number]) -------------------------------------------------------------------------------- SuperBill Details  Patient Name: Arredondo, Lon L. Date of Service: 07/11/2020 Medical Record Number: [id number] Patient Account Number: [id number] Date of Birth/Sex: 11-Dec-1960 (60 y.o. M) Treating RN: Dolan Amen Primary Care Provider: Dorisann Frames Other Clinician: Referring Provider: Dorisann Frames Treating Provider/Extender: [name] in Treatment: 1 Diagnosis Coding ICD-10 Codes Code Description [phone] Unspecified open wound, left lower leg, initial encounter 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 layer exposed N18.6 End stage renal disease Z99.2 Dependence on renal dialysis F20.89 Other schizophrenia Facility Procedures CPT4 Code: [id number] Description: (Facility Use Only) [phone] - [facility] LWR LT LEG Modifier: Quantity: 1 Physician Procedures CPT4 [id number] Description: [id number] - WC PHYS LEVEL 3 - EST PT Modifier: Quantity: 1 CPT4 Code: Description: ICD-10 Diagnosis Description S81.802A Unspecified open wound, left lower leg, initial encounter 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 lay N18.6 End stage renal disease Z99.2 Dependence on renal dialysis Modifier: er exposed Quantity: Electronic Signature(s) Signed: 07/11/2020 2:00:24 PM By: Worthy Keeler PA-C Entered By: Worthy Keeler on 07/11/2020 14:00:24

## 2020-07-11 NOTE — Progress Notes (Signed)
Franklyn, Rieley L. ([id number]) Visit Report for 07/11/2020 Arrival Information Details Patient Name: [name], [name] Date of Service: 07/11/2020 1:30 PM Medical Record Number: [id number] Patient Account Number: [id number] Date of Birth/Sex: 1961/02/04 (60 y.o. M) Treating RN: Carlene Coria Primary Care Tristen Luce: Dorisann Frames Other Clinician: Referring Shannan Slinker: Dorisann Frames Treating Lorna Strother/Extender: [name] in Treatment: 1 Visit Information History Since Last Visit All ordered tests and consults were completed: No Patient Arrived: Wheel Chair Added or deleted any medications: No Arrival Time: 13:29 Any new allergies or adverse reactions: No Accompanied By: caregiver Had a fall or experienced change in No Transfer Assistance: None activities of daily living that may affect Patient Identification Verified: Yes risk of falls: Secondary Verification Process Completed: Yes Signs or symptoms of abuse/neglect since last visito No Patient Requires Transmission-Based Precautions: No Hospitalized since last visit: No Patient Has Alerts: No Implantable device outside of the clinic excluding No cellular tissue based products placed in the center since last visit: Has Dressing in Place as Prescribed: Yes Has Compression in Place as Prescribed: Yes Pain Present Now: No Electronic Signature(s) Signed: 07/11/2020 5:17:48 PM By: Carlene Coria RN Entered By: Carlene Coria on 07/11/2020 13:31:00 [name], [name] ([id number]) -------------------------------------------------------------------------------- Clinic Level of Care Assessment Details Patient Name: [name], [name] Date of Service: 07/11/2020 1:30 PM Medical Record Number: [id number] Patient Account Number: [id number] Date of Birth/Sex: 1960-10-08 (60 y.o. M) Treating RN: Dolan Amen Primary Care Aleese Kamps: Dorisann Frames Other Clinician: Referring Jemina Scahill: Dorisann Frames Treating Saban Heinlen/Extender: [name] in Treatment: 1 Clinic Level of Care Assessment Items TOOL 1 Quantity Score '[]'$  - Use when EandM and Procedure is performed on INITIAL visit 0 ASSESSMENTS - Nursing Assessment / Reassessment '[]'$  - General Physical Exam (combine w/ comprehensive assessment (listed just below) when performed on new 0 pt. evals) '[]'$  - 0 Comprehensive Assessment (HX, ROS, Risk Assessments, Wounds Hx, etc.) ASSESSMENTS - Wound and Skin Assessment / Reassessment '[]'$  - Dermatologic / Skin Assessment (not related to wound area) 0 ASSESSMENTS - Ostomy and/or Continence Assessment and Care '[]'$  - Incontinence Assessment and Management 0 '[]'$  - 0 Ostomy Care Assessment and Management (repouching, etc.) PROCESS - Coordination of Care '[]'$  - Simple Patient / Family Education for ongoing care 0 '[]'$  - 0 Complex (extensive) Patient / Family Education for ongoing care '[]'$  - 0 Staff obtains Programmer, systems, Records, Test Results / Process Orders '[]'$  - 0 Staff telephones HHA, Nursing Homes / Clarify orders / etc '[]'$  - 0 Routine Transfer to another Facility (non-emergent condition) '[]'$  - 0 Routine Hospital Admission (non-emergent condition) '[]'$  - 0 New Admissions / Biomedical engineer / Ordering NPWT, Apligraf, etc. '[]'$  - 0 Emergency Hospital Admission (emergent condition) PROCESS - Special Needs '[]'$  - Pediatric / Minor Patient Management 0 '[]'$  - 0 Isolation Patient Management '[]'$  - 0 Hearing / Language / Visual special needs '[]'$  - 0 Assessment of Community assistance (transportation, D/C planning, etc.) '[]'$  - 0 Additional assistance / Altered mentation '[]'$  - 0 Support Surface(s) Assessment (bed, cushion, seat, etc.) INTERVENTIONS - Miscellaneous '[]'$  - External ear exam 0 '[]'$  - 0 Patient Transfer (multiple staff / Civil Service fast streamer / Similar devices) '[]'$  - 0 Simple Staple / Suture removal (25 or less) '[]'$  - 0 Complex Staple / Suture removal (26 or more) '[]'$  - 0 Hypo/Hyperglycemic Management (do not check if billed  separately) '[]'$  - 0 Ankle / Brachial Index (ABI) - do not check if billed separately Has the patient been seen at the hospital within the  last three years: Yes Total Score: 0 Level Of Care: ____ Lerew, Kohle L. ([id number]) Electronic Signature(s) Signed: 07/11/2020 3:10:49 PM By: Georges Mouse, Minus Breeding RN Entered By: Georges Mouse, Minus Breeding on 07/11/2020 13:53:17 [name], [name] ([id number]) -------------------------------------------------------------------------------- Compression Therapy Details Patient Name: Arechiga, Jarrah L. Date of Service: 07/11/2020 1:30 PM Medical Record Number: [id number] Patient Account Number: [id number] Date of Birth/Sex: Oct 19, 1960 (60 y.o. M) Treating RN: Dolan Amen Primary Care Gean Larose: Dorisann Frames Other Clinician: Referring Franceen Erisman: Dorisann Frames Treating Roran Wegner/Extender: [name] in Treatment: 1 Compression Therapy Performed for Wound Assessment: Wound #1 Left,Anterior Lower Leg Performed By: Clinician Dolan Amen, RN Compression Type: Rolena Infante Post Procedure Diagnosis Same as Pre-procedure Electronic Signature(s) Signed: 07/11/2020 3:10:49 PM By: Georges Mouse, Minus Breeding RN Entered By: Georges Mouse, Minus Breeding on 07/11/2020 13:53:03 [name], [name] ([id number]) -------------------------------------------------------------------------------- Encounter Discharge Information Details Patient Name: [name], [name] Date of Service: 07/11/2020 1:30 PM Medical Record Number: [id number] Patient Account Number: [id number] Date of Birth/Sex: 1960/09/24 (60 y.o. M) Treating RN: Dolan Amen Primary Care Miata Culbreth: Dorisann Frames Other Clinician: Referring Tamaya Pun: Dorisann Frames Treating Jolly Carlini/Extender: [name] in Treatment: 1 Encounter Discharge Information Items Discharge Condition: Stable Ambulatory Status: Ambulatory Discharge Destination: Other (Note Required) Orders Sent: No Transportation:  Private Auto Accompanied By: caregiver Schedule Follow-up Appointment: Yes Clinical Summary of Care: Electronic Signature(s) Signed: 07/11/2020 3:10:49 PM By: Georges Mouse, Minus Breeding RN Entered By: Georges Mouse, Minus Breeding on 07/11/2020 13:54:27 [name], [name] ([id number]) -------------------------------------------------------------------------------- Lower Extremity Assessment Details Patient Name: [name], [name] Date of Service: 07/11/2020 1:30 PM Medical Record Number: [id number] Patient Account Number: [id number] Date of Birth/Sex: May 01, 1961 (60 y.o. M) Treating RN: Carlene Coria Primary Care Treg Diemer: Dorisann Frames Other Clinician: Referring Abbigaile Rockman: Dorisann Frames Treating Kathyann Spaugh/Extender: [name] in Treatment: 1 Edema Assessment Assessed: [Left: No] Patrice Paradise: No] [Left: Edema] [Right: :] Calf Left: Right: Point of Measurement: From Medial Instep 35 cm Ankle Left: Right: Point of Measurement: From Medial Instep 22.6 cm Electronic Signature(s) Signed: 07/11/2020 5:17:48 PM By: Carlene Coria RN Entered By: Carlene Coria on 07/11/2020 13:39:42 [name], [name] ([id number]) -------------------------------------------------------------------------------- Multi Wound Chart Details Patient Name: [name], [name] Date of Service: 07/11/2020 1:30 PM Medical Record Number: [id number] Patient Account Number: [id number] Date of Birth/Sex: 07/17/1960 (60 y.o. M) Treating RN: Dolan Amen Primary Care Anthea Udovich: Dorisann Frames Other Clinician: Referring Adena Sima: Dorisann Frames Treating Britiney Blahnik/Extender: [name] in Treatment: 1 Vital Signs Height(in): 75 Pulse(bpm): 20 [name](lbs): 190 Blood Pressure(mmHg): 116/66 Body Mass Index(BMI): 24 Temperature(F): 98.4 Respiratory Rate(breaths/min): 18 Photos: [N/A:N/A] Wound Location: Left, Anterior Lower Leg N/A N/A Wounding Event: Laceration N/A N/A Primary Etiology: Trauma, Other N/A  N/A Comorbid History: Chronic Obstructive Pulmonary N/A N/A Disease (COPD), Hypertension Date Acquired: 06/18/2020 N/A N/A Weeks of Treatment: 1 N/A N/A Wound Status: Open N/A N/A Measurements L x W x D (cm) 11.5x5.5x0.5 N/A N/A Area (cm) : 49.676 N/A N/A Volume (cm) : 24.838 N/A N/A % Reduction in Area: 8.00% N/A N/A % Reduction in Volume: -15.00% N/A N/A Classification: Full Thickness Without Exposed N/A N/A Support Structures Exudate Amount: Medium N/A N/A Exudate Type: Serosanguineous N/A N/A Exudate Color: red, brown N/A N/A Granulation Amount: Small (1-33%) N/A N/A Granulation Quality: Red N/A N/A Necrotic Amount: Large (67-100%) N/A N/A Necrotic Tissue: Eschar, Adherent Slough N/A N/A Exposed Structures: Fat Layer (Subcutaneous Tissue): N/A N/A Yes Fascia: No Tendon: No Muscle: No Joint: No Bone: No Epithelialization: None N/A N/A Treatment Notes Electronic Signature(s) Signed: 07/11/2020 3:10:49 PM By: Georges Mouse,  Kenia RN Entered By: Georges Mouse, Minus Breeding on 07/11/2020 13:44:24 [name], [name] ([id number]) -------------------------------------------------------------------------------- Multi-Disciplinary Care Plan Details Patient Name: [name], [name] Date of Service: 07/11/2020 1:30 PM Medical Record Number: [id number] Patient Account Number: [id number] Date of Birth/Sex: December 03, 1960 (60 y.o. M) Treating RN: Dolan Amen Primary Care Rozena Fierro: Dorisann Frames Other Clinician: Referring Lindley Stachnik: Dorisann Frames Treating Pallie Swigert/Extender: [name] in Treatment: 1 Active Inactive Wound/Skin Impairment Nursing Diagnoses: Impaired tissue integrity Goals: Patient/caregiver will verbalize understanding of skin care regimen Date Initiated: 07/04/2020 Target Resolution Date: 07/04/2020 Goal Status: Active Ulcer/skin breakdown will have a volume reduction of 30% by week 4 Date Initiated: 07/04/2020 Target Resolution Date: 08/01/2020 Goal Status:  Active Ulcer/skin breakdown will have a volume reduction of 50% by week 8 Date Initiated: 07/04/2020 Target Resolution Date: 09/01/2020 Goal Status: Active Interventions: Assess patient/caregiver ability to obtain necessary supplies Assess patient/caregiver ability to perform ulcer/skin care regimen upon admission and as needed Assess ulceration(s) every visit Treatment Activities: Skin care regimen initiated : 07/04/2020 Topical wound management initiated : 07/04/2020 Notes: Electronic Signature(s) Signed: 07/11/2020 3:10:49 PM By: Georges Mouse, Minus Breeding RN Entered By: Georges Mouse, Minus Breeding on 07/11/2020 13:44:07 [name], [name] ([id number]) -------------------------------------------------------------------------------- Pain Assessment Details Patient Name: Slaugh, Artha L. Date of Service: 07/11/2020 1:30 PM Medical Record Number: [id number] Patient Account Number: [id number] Date of Birth/Sex: 06/13/1960 (60 y.o. M) Treating RN: Carlene Coria Primary Care Adarius Tigges: Dorisann Frames Other Clinician: Referring Joycie Aerts: Dorisann Frames Treating Steph Cheadle/Extender: [name] in Treatment: 1 Active Problems Location of Pain Severity and Description of Pain Patient Has Paino No Site Locations Pain Management and Medication Current Pain Management: Electronic Signature(s) Signed: 07/11/2020 5:17:48 PM By: Carlene Coria RN Entered By: Carlene Coria on 07/11/2020 13:31:35 [name], [name] ([id number]) -------------------------------------------------------------------------------- Patient/Caregiver Education Details Patient Name: Hewlett, Mohd L. Date of Service: 07/11/2020 1:30 PM Medical Record Number: [id number] Patient Account Number: [id number] Date of Birth/Gender: 1960/11/17 (60 y.o. M) Treating RN: Dolan Amen Primary Care Physician: Dorisann Frames Other Clinician: Referring Physician: Dorisann Frames Treating Physician/Extender: [name] in Treatment:  1 Education Assessment Education Provided To: Caregiver Richarda Blade Education Topics Provided Wound/Skin Impairment: Methods: Explain/Verbal Responses: State content correctly [employer]) Signed: 07/11/2020 3:10:49 PM By: Georges Mouse, Minus Breeding RN Entered By: Georges Mouse, Minus Breeding on 07/11/2020 13:53:44 Ogando, Darby L. ([id number]) -------------------------------------------------------------------------------- Wound Assessment Details Patient Name: Keel, Reiley L. Date of Service: 07/11/2020 1:30 PM Medical Record Number: [id number] Patient Account Number: [id number] Date of Birth/Sex: 23-Feb-1961 (60 y.o. M) Treating RN: Carlene Coria Primary Care Jenille Laszlo: Dorisann Frames Other Clinician: Referring Meiah Zamudio: Dorisann Frames Treating Steele Stracener/Extender: [name] in Treatment: 1 Wound Status Wound Number: 1 Primary Trauma, Other Etiology: Wound Location: Left, Anterior Lower Leg Wound Status: Open Wounding Event: Laceration Comorbid Chronic Obstructive Pulmonary Disease (COPD), Date Acquired: 06/18/2020 History: Hypertension Weeks Of Treatment: 1 Clustered Wound: No Photos Wound Measurements Length: (cm) 11.5 Width: (cm) 5.5 Depth: (cm) 0.5 Area: (cm) 49.676 Volume: (cm) 24.838 % Reduction in Area: 8% % Reduction in Volume: -15% Epithelialization: None Tunneling: No Undermining: No Wound Description Classification: Full Thickness Without Exposed Support Structures Exudate Amount: Medium Exudate Type: Serosanguineous Exudate Color: red, brown Foul Odor After Cleansing: No Slough/Fibrino Yes Wound Bed Granulation Amount: Small (1-33%) Exposed Structure Granulation Quality: Red Fascia Exposed: No Necrotic Amount: Large (67-100%) Fat Layer (Subcutaneous Tissue) Exposed: Yes Necrotic Quality: Eschar, Adherent Slough Tendon Exposed: No Muscle Exposed: No Joint Exposed: No Bone Exposed: No Treatment Notes Wound #1 (Lower Leg) Wound  Laterality: Left,  Anterior Cleanser Normal Saline Discharge Instruction: Wash your hands with soap and water. Remove old dressing, discard into plastic bag and place into trash. Cleanse the wound with Normal Saline prior to applying a clean dressing using gauze sponges, not tissues or cotton balls. Do not scrub or use excessive force. Pat dry using gauze sponges, not tissue or cotton balls. Wound Cleanser Broaden, Mylin L. ([id number]) Discharge Instruction: Wash your hands with soap and water. Remove old dressing, discard into plastic bag and place into trash. Cleanse the wound with Wound Cleanser prior to applying a clean dressing using gauze sponges, not tissues or cotton balls. Do not scrub or use excessive force. Pat dry using gauze sponges, not tissue or cotton balls. Peri-Wound Care Topical Primary Dressing Silvercel 4 1/4x 4 1/4 (in/in) Discharge Instruction: Tuck Silvercel wound bed/undermining region Secondary Dressing Kerlix 4.5 x 4.1 (in/yd) Discharge Instruction: Apply Kerlix 4.5 x 4.1 (in/yd) as instructed Xtrasorb Large 6x9 (in/in) Discharge Instruction: Apply to wound as directed. Do not cut. Secured With Compression Wrap Unna Boot 4x10 (in/yd) Discharge Instruction: Unna Paste, Kerlix and Coban from base of toes to three finger widths below bend in knee. Compression Stockings Add-Ons Electronic Signature(s) Signed: 07/11/2020 5:17:48 PM By: Carlene Coria RN Entered By: Carlene Coria on 07/11/2020 13:39:14 Tarrant, Divine L. ([id number]) -------------------------------------------------------------------------------- Vitals Details Patient Name: Castiglia, Orlondo L. Date of Service: 07/11/2020 1:30 PM Medical Record Number: [id number] Patient Account Number: [id number] Date of Birth/Sex: 09-23-1960 (60 y.o. M) Treating RN: Carlene Coria Primary Care Yulia Ulrich: Dorisann Frames Other Clinician: Referring Magally Vahle: Dorisann Frames Treating Halima Fogal/Extender: [name] in Treatment: 1 Vital Signs Time Taken: 13:31 Temperature (F): 98.4 Height (in): 75 Pulse (bpm): 79 [name] (lbs): 190 Respiratory Rate (breaths/min): 18 Body Mass Index (BMI): 23.7 Blood Pressure (mmHg): 116/66 Reference Range: 80 - 120 mg / dl Electronic Signature(s) Signed: 07/11/2020 5:17:48 PM By: Carlene Coria RN Entered By: Carlene Coria on 07/11/2020 13:31:25

## 2020-07-18 ENCOUNTER — Encounter: Payer: Medicaid Other | Admitting: Physician Assistant

## 2020-07-18 ENCOUNTER — Other Ambulatory Visit: Payer: Self-pay

## 2020-07-18 DIAGNOSIS — L97822 Non-pressure chronic ulcer of other part of left lower leg with fat layer exposed: Secondary | ICD-10-CM | POA: Diagnosis not present

## 2020-07-18 NOTE — Progress Notes (Addendum)
Todd Brown. ([id number]) Visit Report for 07/18/2020 Arrival Information Details Patient Name: Todd Brown, Todd Brown. Date of Service: 07/18/2020 10:15 AM Medical Record Number: [id number] Patient Account Number: [id number] Date of Birth/Sex: April 05, 1961 (61 y.o. Male) Treating RN: Carlene Coria Primary Care China Deitrick: Dorisann Frames Other Clinician: Referring Treshon Stannard: Dorisann Frames Treating Paddy Walthall/Extender: [name] in Treatment: 2 Visit Information History Since Last Visit All ordered tests and consults were completed: No Patient Arrived: Wheel Chair Added or deleted any medications: No Arrival Time: 10:55 Any new allergies or adverse reactions: No Accompanied By: self Had a fall or experienced change in No Transfer Assistance: None activities of daily living that may affect Patient Identification Verified: Yes risk of falls: Secondary Verification Process Completed: Yes Signs or symptoms of abuse/neglect since last visito No Patient Requires Transmission-Based Precautions: No Hospitalized since last visit: No Patient Has Alerts: No Implantable device outside of the clinic excluding No cellular tissue based products placed in the center since last visit: Has Dressing in Place as Prescribed: Yes Has Compression in Place as Prescribed: Yes Pain Present Now: No Electronic Signature(s) Signed: 07/22/2020 4:57:18 PM By: Carlene Coria RN Entered By: Carlene Coria on 07/18/2020 10:55:53 Todd Brown, Todd Brown. ([id number]) -------------------------------------------------------------------------------- Clinic Level of Care Assessment Details Patient Name: Todd Brown, Todd Brown. Date of Service: 07/18/2020 10:15 AM Medical Record Number: [id number] Patient Account Number: [id number] Date of Birth/Sex: Apr 13, 1961 (60 y.o. Male) Treating RN: Dolan Amen Primary Care Ovidio Steele: Dorisann Frames Other Clinician: Referring Georgiann Neider: Dorisann Frames Treating Viridiana Spaid/Extender:  [name] in Treatment: 2 Clinic Level of Care Assessment Items TOOL 1 Quantity Score []  - Use when EandM and Procedure is performed on INITIAL visit 0 ASSESSMENTS - Nursing Assessment / Reassessment []  - General Physical Exam (combine w/ comprehensive assessment (listed just below) when performed on new 0 pt. evals) []  - 0 Comprehensive Assessment (HX, ROS, Risk Assessments, Wounds Hx, etc.) ASSESSMENTS - Wound and Skin Assessment / Reassessment []  - Dermatologic / Skin Assessment (not related to wound area) 0 ASSESSMENTS - Ostomy and/or Continence Assessment and Care []  - Incontinence Assessment and Management 0 []  - 0 Ostomy Care Assessment and Management (repouching, etc.) PROCESS - Coordination of Care []  - Simple Patient / Family Education for ongoing care 0 []  - 0 Complex (extensive) Patient / Family Education for ongoing care []  - 0 Staff obtains Programmer, systems, Records, Test Results / Process Orders []  - 0 Staff telephones HHA, Nursing Homes / Clarify orders / etc []  - 0 Routine Transfer to another Facility (non-emergent condition) []  - 0 Routine Hospital Admission (non-emergent condition) []  - 0 New Admissions / Biomedical engineer / Ordering NPWT, Apligraf, etc. []  - 0 Emergency Hospital Admission (emergent condition) PROCESS - Special Needs []  - Pediatric / Minor Patient Management 0 []  - 0 Isolation Patient Management []  - 0 Hearing / Language / Visual special needs []  - 0 Assessment of Community assistance (transportation, D/C planning, etc.) []  - 0 Additional assistance / Altered mentation []  - 0 Support Surface(s) Assessment (bed, cushion, seat, etc.) INTERVENTIONS - Miscellaneous []  - External ear exam 0 []  - 0 Patient Transfer (multiple staff / Civil Service fast streamer / Similar devices) []  - 0 Simple Staple / Suture removal (25 or less) []  - 0 Complex Staple / Suture removal (26 or more) []  - 0 Hypo/Hyperglycemic Management (do not check if billed  separately) []  - 0 Ankle / Brachial Index (ABI) - do not check if billed separately Has the patient been seen at the hospital within the  last three years: Yes Total Score: 0 Level Of Care: ____ Todd Brown, Todd Brown. ([id number]) Electronic Signature(s) Signed: 07/18/2020 12:41:55 PM By: Georges Mouse, Minus Breeding RN Entered By: Georges Mouse, Minus Breeding on 07/18/2020 11:13:57 Todd Brown, Todd Brown. ([id number]) -------------------------------------------------------------------------------- Encounter Discharge Information Details Patient Name: Todd Brown. Date of Service: 07/18/2020 10:15 AM Medical Record Number: [id number] Patient Account Number: [id number] Date of Birth/Sex: 07/25/1960 (60 y.o. Male) Treating RN: Dolan Amen Primary Care Magenta Schmiesing: Dorisann Frames Other Clinician: Referring Jaleeya Mcnelly: Dorisann Frames Treating Kebra Lowrimore/Extender: [name] in Treatment: 2 Encounter Discharge Information Items Post Procedure Vitals Discharge Condition: Stable Temperature (F): 98.3 Ambulatory Status: Ambulatory Pulse (bpm): 82 Discharge Destination: Home Respiratory Rate (breaths/min): 20 Transportation: Private Auto Blood Pressure (mmHg): 115/57 Accompanied By: self Schedule Follow-up Appointment: Yes Clinical Summary of Care: Electronic Signature(s) Signed: 07/18/2020 12:41:55 PM By: Georges Mouse, Minus Breeding RN Entered By: Georges Mouse, Minus Breeding on 07/18/2020 11:15:14 Todd Brown, Todd Brown. ([id number]) -------------------------------------------------------------------------------- Lower Extremity Assessment Details Patient Name: Todd Brown, Todd Brown. Date of Service: 07/18/2020 10:15 AM Medical Record Number: [id number] Patient Account Number: [id number] Date of Birth/Sex: 05/07/1961 (60 y.o. Male) Treating RN: Carlene Coria Primary Care Noheli Melder: Dorisann Frames Other Clinician: Referring Kacee Sukhu: Dorisann Frames Treating Hartleigh Edmonston/Extender: [name] in Treatment:  2 Edema Assessment Assessed: [Left: No] [Right: No] [Left: Edema] [Right: :] Calf Left: Right: Point of Measurement: 39 cm From Medial Instep 36 cm Ankle Left: Right: Point of Measurement: 10 cm From Medial Instep 22 cm Vascular Assessment Pulses: Dorsalis Pedis Palpable: [Left:Yes] Electronic Signature(s) Signed: 07/22/2020 4:57:18 PM By: Carlene Coria RN Entered By: Carlene Coria on 07/18/2020 11:05:44 Todd Brown, Todd Brown. ([id number]) -------------------------------------------------------------------------------- Multi Wound Chart Details Patient Name: Todd Brown, Todd Brown. Date of Service: 07/18/2020 10:15 AM Medical Record Number: [id number] Patient Account Number: [id number] Date of Birth/Sex: 08-Nov-1960 (60 y.o. Male) Treating RN: Dolan Amen Primary Care Venesha Petraitis: Dorisann Frames Other Clinician: Referring Rigdon Macomber: Dorisann Frames Treating Chenika Nevils/Extender: [name] in Treatment: 2 Vital Signs Height(in): 75 Pulse(bpm): 52 Weight(lbs): 190 Blood Pressure(mmHg): 115/57 Body Mass Index(BMI): 24 Temperature(F): 98.3 Respiratory Rate(breaths/min): 20 Photos: [N/A:N/A] Wound Location: Left, Anterior Lower Leg N/A N/A Wounding Event: Laceration N/A N/A Primary Etiology: Trauma, Other N/A N/A Comorbid History: Chronic Obstructive Pulmonary N/A N/A Disease (COPD), Hypertension Date Acquired: 06/18/2020 N/A N/A Weeks of Treatment: 2 N/A N/A Wound Status: Open N/A N/A Measurements Brown x W x D (cm) 12x5x0.3 N/A N/A Area (cm) : 47.124 N/A N/A Volume (cm) : 14.137 N/A N/A % Reduction in Area: 12.70% N/A N/A % Reduction in Volume: 34.50% N/A N/A Classification: Full Thickness Without Exposed N/A N/A Support Structures Exudate Amount: Medium N/A N/A Exudate Type: Serosanguineous N/A N/A Exudate Color: red, Brown N/A N/A Granulation Amount: Small (1-33%) N/A N/A Granulation Quality: Red N/A N/A Necrotic Amount: Large (67-100%) N/A N/A Necrotic Tissue: Eschar,  Adherent Slough N/A N/A Exposed Structures: Fat Layer (Subcutaneous Tissue): N/A N/A Yes Fascia: No Tendon: No Muscle: No Joint: No Bone: No Epithelialization: None N/A N/A Treatment Notes Electronic Signature(s) Signed: 07/18/2020 12:41:55 PM By: Georges Mouse, Minus Breeding RN Entered By: Georges Mouse, Minus Breeding on 07/18/2020 11:11:16 Todd Brown, Todd Brown. ([id number]) -------------------------------------------------------------------------------- Multi-Disciplinary Care Plan Details Patient Name: Cremeans, Nehemyah Brown. Date of Service: 07/18/2020 10:15 AM Medical Record Number: [id number] Patient Account Number: [id number] Date of Birth/Sex: [birth year] (60 y.o. Male) Treating RN: Dolan Amen Primary Care Anthonyjames Bargar: Dorisann Frames Other Clinician: Referring Krislynn Gronau: Dorisann Frames Treating Laureano Hetzer/Extender: [name] in Treatment: 2 Active Inactive Wound/Skin Impairment Nursing Diagnoses: Impaired tissue integrity Goals: Patient/caregiver will  verbalize understanding of skin care regimen Date Initiated: 07/04/2020 Date Inactivated: 07/18/2020 Target Resolution Date: 07/04/2020 Goal Status: Met Ulcer/skin breakdown will have a volume reduction of 30% by week 4 Date Initiated: 07/04/2020 Target Resolution Date: 08/01/2020 Goal Status: Active Ulcer/skin breakdown will have a volume reduction of 50% by week 8 Date Initiated: 07/04/2020 Target Resolution Date: 09/01/2020 Goal Status: Active Interventions: Assess patient/caregiver ability to obtain necessary supplies Assess patient/caregiver ability to perform ulcer/skin care regimen upon admission and as needed Assess ulceration(s) every visit Treatment Activities: Skin care regimen initiated : 07/04/2020 Topical wound management initiated : 07/04/2020 Notes: Electronic Signature(s) Signed: 07/18/2020 12:41:55 PM By: Georges Mouse, Minus Breeding RN Entered By: Georges Mouse, Minus Breeding on 07/18/2020 11:11:07 Todd Brown, Todd Brown.  ([id number]) -------------------------------------------------------------------------------- Pain Assessment Details Patient Name: Todd Brown, Todd Brown. Date of Service: 07/18/2020 10:15 AM Medical Record Number: [id number] Patient Account Number: [id number] Date of Birth/Sex: 07/03/1960 (60 y.o. Male) Treating RN: Carlene Coria Primary Care Skyler Dusing: Dorisann Frames Other Clinician: Referring Tristyn Pharris: Dorisann Frames Treating Bertran Zeimet/Extender: [name] in Treatment: 2 Active Problems Location of Pain Severity and Description of Pain Patient Has Paino No Site Locations Pain Management and Medication Current Pain Management: Electronic Signature(s) Signed: 07/22/2020 4:57:18 PM By: Carlene Coria RN Entered By: Carlene Coria on 07/18/2020 10:56:20 Todd Brown, Todd Brown. ([id number]) -------------------------------------------------------------------------------- Patient/Caregiver Education Details Patient Name: Vanlue, Rehan Brown. Date of Service: 07/18/2020 10:15 AM Medical Record Number: [id number] Patient Account Number: [id number] Date of Birth/Gender: 1960-11-20 (60 y.o. Male) Treating RN: Dolan Amen Primary Care Physician: Dorisann Frames Other Clinician: Referring Physician: Dorisann Frames Treating Physician/Extender: [name] in Treatment: 2 Education Assessment Education Provided To: Caregiver Education Topics Provided Wound/Skin Impairment: Methods: Explain/Verbal Responses: State content correctly Electronic Signature(s) Signed: 07/18/2020 12:41:55 PM By: Georges Mouse, Minus Breeding RN Entered By: Georges Mouse, Minus Breeding on 07/18/2020 11:14:34 Culliton, Thayer Brown. ([id number]) -------------------------------------------------------------------------------- Wound Assessment Details Patient Name: Holte, Omer Brown. Date of Service: 07/18/2020 10:15 AM Medical Record Number: [id number] Patient Account Number: [id number] Date of Birth/Sex: 1961-04-10 (60 y.o.  Male) Treating RN: Carlene Coria Primary Care Treylon Henard: Dorisann Frames Other Clinician: Referring Ahmadou Bolz: Dorisann Frames Treating Feliciano Wynter/Extender: [name] in Treatment: 2 Wound Status Wound Number: 1 Primary Trauma, Other Etiology: Wound Location: Left, Anterior Lower Leg Wound Status: Open Wounding Event: Laceration Comorbid Chronic Obstructive Pulmonary Disease (COPD), Date Acquired: 06/18/2020 History: Hypertension Weeks Of Treatment: 2 Clustered Wound: No Photos Wound Measurements Length: (cm) 12 Width: (cm) 5 Depth: (cm) 0.3 Area: (cm) 47.124 Volume: (cm) 14.137 % Reduction in Area: 12.7% % Reduction in Volume: 34.5% Epithelialization: None Tunneling: No Undermining: No Wound Description Classification: Full Thickness Without Exposed Support Structures Exudate Amount: Medium Exudate Type: Serosanguineous Exudate Color: red, Brown Foul Odor After Cleansing: No Slough/Fibrino Yes Wound Bed Granulation Amount: Small (1-33%) Exposed Structure Granulation Quality: Red Fascia Exposed: No Necrotic Amount: Large (67-100%) Fat Layer (Subcutaneous Tissue) Exposed: Yes Necrotic Quality: Eschar, Adherent Slough Tendon Exposed: No Muscle Exposed: No Joint Exposed: No Bone Exposed: No Treatment Notes Wound #1 (Lower Leg) Wound Laterality: Left, Anterior Cleanser Normal Saline Discharge Instruction: Wash your hands with soap and water. Remove old dressing, discard into plastic bag and place into trash. Cleanse the wound with Normal Saline prior to applying a clean dressing using gauze sponges, not tissues or cotton balls. Do not scrub or use excessive force. Pat dry using gauze sponges, not tissue or cotton balls. Wound Cleanser Matos, Jesua Brown. ([id number]) Discharge Instruction: Wash your hands with soap and water. Remove old dressing, discard  into plastic bag and place into trash. Cleanse the wound with Wound Cleanser prior to applying a clean  dressing using gauze sponges, not tissues or cotton balls. Do not scrub or use excessive force. Pat dry using gauze sponges, not tissue or cotton balls. Peri-Wound Care Topical Primary Dressing Silvercel 4 1/4x 4 1/4 (in/in) Discharge Instruction: Tuck Silvercel wound bed/undermining region Secondary Dressing Xtrasorb Large 6x9 (in/in) Discharge Instruction: Apply to wound as directed. Do not cut. Secured With Compression Wrap Unna Boot 4x10 (in/yd) Discharge Instruction: Unna Paste, Kerlix and Coban from base of toes to three finger widths below bend in knee. Compression Stockings Add-Ons Electronic Signature(s) Signed: 07/22/2020 4:57:18 PM By: Carlene Coria RN Entered By: Carlene Coria on 07/18/2020 11:04:49 [place], Sergi Brown. ([id number]) -------------------------------------------------------------------------------- Vitals Details Patient Name: Schipani, Cosme Brown. Date of Service: 07/18/2020 10:15 AM Medical Record Number: [id number] Patient Account Number: [id number] Date of Birth/Sex: March 10, 1961 (60 y.o. Male) Treating RN: Carlene Coria Primary Care Anahla Bevis: Dorisann Frames Other Clinician: Referring Issiac Jamar: Dorisann Frames Treating Reality Dejonge/Extender: [name] in Treatment: 2 Vital Signs Time Taken: 10:55 Temperature (F): 98.3 Height (in): 75 Pulse (bpm): 82 Weight (lbs): 190 Respiratory Rate (breaths/min): 20 Body Mass Index (BMI): 23.7 Blood Pressure (mmHg): 115/57 Reference Range: 80 - 120 mg / dl Electronic Signature(s) Signed: 07/22/2020 4:57:18 PM By: Carlene Coria RN Entered By: Carlene Coria on 07/18/2020 10:56:14

## 2020-07-18 NOTE — Progress Notes (Addendum)
Hegstrom, Ladavion L. ([id number]) Visit Report for 07/18/2020 Chief Complaint Document Details Patient Name: [name], [name] Date of Service: 07/18/2020 10:15 AM Medical Record Number: [id number] Patient Account Number: [id number] Date of Birth/Sex: August 01, 1960 (60 y.o. Male) Treating RN: Dolan Amen Primary Care Provider: Dorisann Frames Other Clinician: Referring Provider: Dorisann Frames Treating Provider/Extender: [name] in Treatment: 2 Information Obtained from: Patient Chief Complaint Left LE Traumatic Ulcer Electronic Signature(s) Signed: 07/18/2020 10:36:37 AM By: Worthy Keeler PA-C Entered By: Worthy Keeler on 07/18/2020 10:36:36 Markoff, Yurem L. ([id number]) -------------------------------------------------------------------------------- Debridement Details Patient Name: [name], [name] Date of Service: 07/18/2020 10:15 AM Medical Record Number: [id number] Patient Account Number: [id number] Date of Birth/Sex: [birth year] (60 y.o. Male) Treating RN: Dolan Amen Primary Care Provider: Dorisann Frames Other Clinician: Referring Provider: Dorisann Frames Treating Provider/Extender: [name] in Treatment: 2 Debridement Performed for Wound #1 Left,Anterior Lower Leg Assessment: Performed By: Physician Tommie Sams., PA-C Debridement Type: Debridement Level of Consciousness (Pre- Awake and Alert procedure): Pre-procedure Verification/Time Out Yes - 11:12 Taken: Start Time: 11:12 Total Area Debrided (L x W): 5.5 (cm) x 5 (cm) = 27.5 (cm) Tissue and other material Viable, Non-Viable, Slough, Subcutaneous, Slough debrided: Level: Skin/Subcutaneous Tissue Debridement Description: Excisional Instrument: Curette Bleeding: Minimum Hemostasis Achieved: Pressure Response to Treatment: Procedure was tolerated well Level of Consciousness (Post- Awake and Alert procedure): Post Debridement Measurements of Total Wound Length: (cm) 12 Width:  (cm) 5 Depth: (cm) 0.4 Volume: (cm) 18.85 Character of Wound/Ulcer Post Debridement: Stable Post Procedure Diagnosis Same as Pre-procedure Electronic Signature(s) Signed: 07/18/2020 12:41:55 PM By: Charlett Nose RN Signed: 07/18/2020 4:58:35 PM By: Worthy Keeler PA-C Entered By: Georges Mouse, Minus Breeding on 07/18/2020 11:19:15 Grunert, Calyb L. ([id number]) -------------------------------------------------------------------------------- HPI Details Patient Name: [name], [name] Date of Service: 07/18/2020 10:15 AM Medical Record Number: [id number] Patient Account Number: [id number] Date of Birth/Sex: 1960-09-12 (60 y.o. Male) Treating RN: Dolan Amen Primary Care Provider: Dorisann Frames Other Clinician: Referring Provider: Dorisann Frames Treating Provider/Extender: [name] in Treatment: 2 History of Present Illness HPI Description: 07/04/2020 patient presents today for initial evaluation here in our clinic. He is actually a ward of the state. With that being said he is only been in the group home that he is currently staying in for the past few weeks in fact it was only about 2 weeks. He was seen in the ED for evaluation on 06/18/2020 due to an injury that he sustained to his left lower leg. He tells me that he got up in the middle the night to go to the bathroom or something and tripped over a wheelchair causing a significant skin tear/laceration to the leg. Unfortunately this was sutured but has dehisced in some areas and appears to have a significant hematoma and others. The proximal portion of the wound has actually necrosis and the skin flap is can have to be removed. The inferior portion appears to still be healthy and has reattached to some degree along the suture line this is still somewhat dehiscing and is can require some sharp debridement. Fortunately there does not appear to be any signs of active infection at this time. With that being said I do  believe that we may want to put him back on some preventative antibiotics, Keflex, just to ensure following the debridement today that he is going to be infection free. Released to the best of our ability. The patient does have a history of end-stage renal disease that he is on  dialysis. He also has schizophrenia at this point is now a ward of the state as it was deemed that he was not competent to be able to completely care for himself. Apparently his living condition prior to going to the group home was very poor. With that being said he seems to be doing much better now in my opinion and I do feel like that the location is that is a very good facility. The patient is going require some sharp debridement today and we did talk to [name] who is at the [facility] to gain approval for the patient. This was for debridement in particular. We did gain that approval and it was myself in [place] who witnessed this. Upon evaluation today patient appears to be doing a little better in regard to his wound currently. Fortunately there is no signs of active infection at this time. No fevers, chills, nausea, vomiting, or diarrhea. He has been taking the Keflex without complication. With that being said I am concerned today that he did develop a blood clot under the skin flap that skin to prevent him healing. I think we need to do something to try to prevent this from continuing to be an issue. For that reason I discussed with him the possibility of doing a Unna boot wrap which I think it was going to be the best thing for him as far as healing is concerned he is in agreement with the plan. 07/18/2020. Upon evaluation today patient's wound bed actually showed signs of improvement compared to previous evaluation. I feel like he is granulating in and does seem to be healing quite nicely. Fortunately there is no evidence of active infection which is great news and overall I think that he is  headed in the right direction. Electronic Signature(s) Signed: 07/18/2020 4:57:09 PM By: Worthy Keeler PA-C Entered By: Worthy Keeler on 07/18/2020 16:57:09 Caprio, Jaleen L. ([id number]) -------------------------------------------------------------------------------- Physical Exam Details Patient Name: Covelli, Aakash L. Date of Service: 07/18/2020 10:15 AM Medical Record Number: [id number] Patient Account Number: [id number] Date of Birth/Sex: [birth year] (60 y.o. Male) Treating RN: Dolan Amen Primary Care Provider: Dorisann Frames Other Clinician: Referring Provider: Dorisann Frames Treating Provider/Extender: [name] in Treatment: 2 Constitutional Well-nourished and well-hydrated in no acute distress. Respiratory normal breathing without difficulty. Psychiatric this patient is able to make decisions and demonstrates good insight into disease process. Alert and Oriented x 3. pleasant and cooperative. Notes Upon inspection patient's wound bed showed signs of good granulation epithelization there does not appear to be any evidence of active infection which is great news and overall very pleased with how things seem to be progressing. I did perform some mild sharp debridement to clear away the necrotic tissue from the surface of the wound and the biofilm post debridement wound bed appears to be doing much better I do feel like the large skin flap has begun to reattach which is great news and overall I am hopeful that he will continue to show signs of good improvement here. Electronic Signature(s) Signed: 07/18/2020 4:57:38 PM By: Worthy Keeler PA-C Entered By: Worthy Keeler on 07/18/2020 16:57:38 Corriveau, Mercy L. ([id number]) -------------------------------------------------------------------------------- Physician Orders Details Patient Name: Rader, Rahkeem L. Date of Service: 07/18/2020 10:15 AM Medical Record Number: [id number] Patient Account Number:  [id number] Date of Birth/Sex: 1961-03-05 (60 y.o. Male) Treating RN: Dolan Amen Primary Care Provider: Dorisann Frames Other Clinician: Referring Provider: Dorisann Frames Treating Provider/Extender: [name] in Treatment:  2 Verbal / Phone Orders: No Diagnosis Coding ICD-10 Coding Code Description S81.802A Unspecified open wound, left lower leg, initial encounter 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 layer exposed N18.6 End stage renal disease Z99.2 Dependence on renal dialysis F20.89 Other schizophrenia Follow-up Appointments Wound #1 Left,Anterior Lower Leg o Return Appointment in 1 week. Home Health Wound #1 [place] o ADMIT to [facility] for wound care. May utilize formulary equivalent dressing for wound treatment orders unless otherwise specified. Home Health Nurse may visit PRN to address patientos wound care needs. o Scheduled days for dressing changes to be completed; exception, patient has scheduled wound care visit that day. - Dressing changes Tuesday/Thursday in office/Saturday o **Please direct any NON-WOUND related issues/requests for orders to patient's Primary Care Physician. **If current dressing causes regression in wound condition, may D/C ordered dressing product/s and apply Normal Saline Moist Dressing daily until next [place] or Other MD appointment. **Notify [facility] of regression in wound condition at [phone]. Anesthetic (Use 'Patient Medications' Section for Anesthetic Order Entry) Wound #1 Left,Anterior Lower Leg o Lidocaine applied to wound bed o Benzocaine applied to wound bed. Wound Treatment Wound #1 - Lower Leg Wound Laterality: Left, Anterior Cleanser: Normal Saline 3 x Per Week/30 Days Discharge Instructions: Wash your hands with soap and water. Remove old dressing, discard into plastic bag and place into  trash. Cleanse the wound with Normal Saline prior to applying a clean dressing using gauze sponges, not tissues or cotton balls. Do not scrub or use excessive force. Pat dry using gauze sponges, not tissue or cotton balls. Cleanser: Wound Cleanser 3 x Per Week/30 Days Discharge Instructions: Wash your hands with soap and water. Remove old dressing, discard into plastic bag and place into trash. Cleanse the wound with Wound Cleanser prior to applying a clean dressing using gauze sponges, not tissues or cotton balls. Do not scrub or use excessive force. Pat dry using gauze sponges, not tissue or cotton balls. Primary Dressing: Silvercel 4 1/4x 4 1/4 (in/in) 3 x Per Week/30 Days Discharge Instructions: Apply Silvercel on wound bed/tuck in undermining region Secondary Dressing: Xtrasorb Medium 4x5 (in/in) 3 x Per Week/30 Days Discharge Instructions: Apply to wound as directed. Do not cut. Compression Wrap: Unna Boot 4x10 (in/yd) 3 x Per Week/30 Days Discharge Instructions: Unna Paste, Kerlix and Coban from base of toes to three finger widths below bend in knee. Gondek, Maveryk L. ([id number]) Electronic Signature(s) Signed: 07/18/2020 12:41:55 PM By: Georges Mouse, Minus Breeding RN Signed: 07/18/2020 4:58:35 PM By: Worthy Keeler PA-C Entered By: Georges Mouse, Minus Breeding on 07/18/2020 11:17:33 Fregia, Jaivon L. ([id number]) -------------------------------------------------------------------------------- Problem List Details Patient Name: Collazos, Josiel L. Date of Service: 07/18/2020 10:15 AM Medical Record Number: [id number] Patient Account Number: [id number] Date of Birth/Sex: 06-27-1960 (60 y.o. Male) Treating RN: Dolan Amen Primary Care Provider: Dorisann Frames Other Clinician: Referring Provider: Dorisann Frames Treating Provider/Extender: [name] in Treatment: 2 Active Problems ICD-10 Encounter Code Description Active Date MDM Diagnosis S81.802A Unspecified open wound, left  lower leg, initial encounter 07/04/2020 No Yes 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 layer 07/04/2020 No Yes exposed N18.6 End stage renal disease 07/04/2020 No Yes Z99.2 Dependence on renal dialysis 07/04/2020 No Yes F20.89 Other schizophrenia 07/04/2020 No Yes Inactive Problems Resolved Problems Electronic Signature(s) Signed: 07/18/2020 10:34:39 AM By: Worthy Keeler PA-C Entered By: Worthy Keeler on  07/18/2020 10:34:39 Laprise, Joss L. ([id number]) -------------------------------------------------------------------------------- Progress Note Details Patient Name: Mccready, Gaudencio L. Date of Service: 07/18/2020 10:15 AM Medical Record Number: [id number] Patient Account Number: [id number] Date of Birth/Sex: September 04, 1960 (60 y.o. Male) Treating RN: Dolan Amen Primary Care Provider: Dorisann Frames Other Clinician: Referring Provider: Dorisann Frames Treating Provider/Extender: [name] in Treatment: 2 Subjective Chief Complaint Information obtained from Patient Left LE Traumatic Ulcer History of Present Illness (HPI) 07/04/2020 patient presents today for initial evaluation here in our clinic. He is actually a ward of the state. With that being said he is only been in the group home that he is currently staying in for the past few weeks in fact it was only about 2 weeks. He was seen in the ED for evaluation on 06/18/2020 due to an injury that he sustained to his left lower leg. He tells me that he got up in the middle the night to go to the bathroom or something and tripped over a wheelchair causing a significant skin tear/laceration to the leg. Unfortunately this was sutured but has dehisced in some areas and appears to have a significant hematoma and others. The proximal portion of the wound has actually necrosis and the skin flap is can have to be removed. The inferior portion appears to still be healthy and has reattached to some degree along  the suture line this is still somewhat dehiscing and is can require some sharp debridement. Fortunately there does not appear to be any signs of active infection at this time. With that being said I do believe that we may want to put him back on some preventative antibiotics, Keflex, just to ensure following the debridement today that he is going to be infection free. Released to the best of our ability. The patient does have a history of end-stage renal disease that he is on dialysis. He also has schizophrenia at this point is now a ward of the state as it was deemed that he was not competent to be able to completely care for himself. Apparently his living condition prior to going to the group home was very poor. With that being said he seems to be doing much better now in my opinion and I do feel like that the location is that is a very good facility. The patient is going require some sharp debridement today and we did talk to [name] who is at the [facility] to gain approval for the patient. This was for debridement in particular. We did gain that approval and it was myself in [place] who witnessed this. Upon evaluation today patient appears to be doing a little better in regard to his wound currently. Fortunately there is no signs of active infection at this time. No fevers, chills, nausea, vomiting, or diarrhea. He has been taking the Keflex without complication. With that being said I am concerned today that he did develop a blood clot under the skin flap that skin to prevent him healing. I think we need to do something to try to prevent this from continuing to be an issue. For that reason I discussed with him the possibility of doing a Unna boot wrap which I think it was going to be the best thing for him as far as healing is concerned he is in agreement with the plan. 07/18/2020. Upon evaluation today patient's wound bed actually showed signs of improvement compared to  previous evaluation. I feel like he is granulating in and does seem  to be healing quite nicely. Fortunately there is no evidence of active infection which is great news and overall I think that he is headed in the right direction. Objective Constitutional Well-nourished and well-hydrated in no acute distress. Vitals Time Taken: 10:55 AM, Height: 75 in, Weight: 190 lbs, BMI: 23.7, Temperature: 98.3 F, Pulse: 82 bpm, Respiratory Rate: 20 breaths/min, Blood Pressure: 115/57 mmHg. Respiratory normal breathing without difficulty. Psychiatric this patient is able to make decisions and demonstrates good insight into disease process. Alert and Oriented x 3. pleasant and cooperative. General Notes: Upon inspection patient's wound bed showed signs of good granulation epithelization there does not appear to be any evidence of active infection which is great news and overall very pleased with how things seem to be progressing. I did perform some mild sharp debridement to clear away the necrotic tissue from the surface of the wound and the biofilm post debridement wound bed appears to be doing much better I do feel like the large skin flap has begun to reattach which is great news and overall I am hopeful that he will continue to show signs of good improvement here. Integumentary (Hair, Skin) Wound #1 status is Open. Original cause of wound was Laceration. The wound is located on the Left,Anterior Lower Leg. The wound measures 12cm length x 5cm width x 0.3cm depth; 47.124cm^2 area and 14.137cm^3 volume. There is Fat Layer (Subcutaneous Tissue) exposed. There Kinser, Keithen L. ([id number]) is no tunneling or undermining noted. There is a medium amount of serosanguineous drainage noted. There is small (1-33%) red granulation within the wound bed. There is a large (67-100%) amount of necrotic tissue within the wound bed including Eschar and Adherent Slough. Assessment Active Problems ICD-10 Unspecified  open wound, left lower leg, initial encounter Non-pressure chronic ulcer of other part of left lower leg with fat layer exposed End stage renal disease Dependence on renal dialysis Other schizophrenia Procedures Wound #1 Pre-procedure diagnosis of Wound #1 is a Trauma, Other located on the Left,Anterior Lower Leg . There was a Excisional Skin/Subcutaneous Tissue Debridement with a total area of 27.5 sq cm performed by Tommie Sams., PA-C. With the following instrument(s): Curette to remove Viable and Non-Viable tissue/material. Material removed includes Subcutaneous Tissue and Slough and. A time out was conducted at 11:12, prior to the start of the procedure. A Minimum amount of bleeding was controlled with Pressure. The procedure was tolerated well. Post Debridement Measurements: 12cm length x 5cm width x 0.4cm depth; 18.85cm^3 volume. Character of Wound/Ulcer Post Debridement is stable. Post procedure Diagnosis Wound #1: Same as Pre-Procedure Plan Follow-up Appointments: Wound #1 Left,Anterior Lower Leg: Return Appointment in 1 week. Home Health: Wound #1 Left,Anterior Lower Leg: [place]: [facility] ADMIT to [facility] [facility] for wound care. May utilize formulary equivalent dressing for wound treatment orders unless otherwise specified. Home Health Nurse may visit PRN to address patient s wound care needs. Scheduled days for dressing changes to be completed; exception, patient has scheduled wound care visit that day. - Dressing changes Tuesday/Thursday in office/Saturday **Please direct any NON-WOUND related issues/requests for orders to patient's Primary Care Physician. **If current dressing causes regression in wound condition, may D/C ordered dressing product/s and apply Normal Saline Moist Dressing daily until next [place] or Other MD appointment. **Notify [facility] of regression in wound condition at [phone]. Anesthetic (Use 'Patient Medications'  Section for Anesthetic Order Entry): Wound #1 Left,Anterior Lower Leg: Lidocaine applied to wound bed Benzocaine applied to wound bed.  WOUND #1: - Lower Leg Wound Laterality: Left, Anterior Cleanser: Normal Saline 3 x Per Week/30 Days Discharge Instructions: Wash your hands with soap and water. Remove old dressing, discard into plastic bag and place into trash. Cleanse the wound with Normal Saline prior to applying a clean dressing using gauze sponges, not tissues or cotton balls. Do not scrub or use excessive force. Pat dry using gauze sponges, not tissue or cotton balls. Cleanser: Wound Cleanser 3 x Per Week/30 Days Discharge Instructions: Wash your hands with soap and water. Remove old dressing, discard into plastic bag and place into trash. Cleanse the wound with Wound Cleanser prior to applying a clean dressing using gauze sponges, not tissues or cotton balls. Do not scrub or use excessive force. Pat dry using gauze sponges, not tissue or cotton balls. Primary Dressing: Silvercel 4 1/4x 4 1/4 (in/in) 3 x Per Week/30 Days Discharge Instructions: Apply Silvercel on wound bed/tuck in undermining region Secondary Dressing: Xtrasorb Medium 4x5 (in/in) 3 x Per Week/30 Days Discharge Instructions: Apply to wound as directed. Do not cut. Compression Wrap: Unna Boot 4x10 (in/yd) 3 x Per Week/30 Days Discharge Instructions: Unna Paste, Kerlix and Coban from base of toes to three finger widths below bend in knee. Payton, Jaimeson L. ([id number]) 1. Would recommend that we going to continue with the wound care measures as before and the patient is in agreement the plan we are utilizing at this point silver alginate dressings which I think is the right thing still. 2. I am also can recommend that we continue with Unna boot wrap which I think has been beneficial for the patient. 3. I am also can recommend the patient continue to elevate his legs much as possible. I do believe home health is doing a  great job with his wraps. We will see patient back for reevaluation in 1 week here in the clinic. If anything worsens or changes patient will contact our office for additional recommendations. Electronic Signature(s) Signed: 07/18/2020 4:58:08 PM By: Worthy Keeler PA-C Entered By: Worthy Keeler on 07/18/2020 16:58:08 Simmon, Gary L. ([id number]) -------------------------------------------------------------------------------- SuperBill Details Patient Name: Tirrell, Homer L. Date of Service: 07/18/2020 Medical Record Number: [id number] Patient Account Number: [id number] Date of Birth/Sex: [birth year] (60 y.o. Male) Treating RN: Dolan Amen Primary Care Provider: Dorisann Frames Other Clinician: Referring Provider: Dorisann Frames Treating Provider/Extender: [name] in Treatment: 2 Diagnosis Coding ICD-10 Codes Code Description [phone] Unspecified open wound, left lower leg, initial encounter 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 layer exposed N18.6 End stage renal disease Z99.2 Dependence on renal dialysis F20.89 Other schizophrenia Facility Procedures CPT4 Code: [id number] Description: 11042 - DEB SUBQ TISSUE 20 SQ CM/< Modifier: Quantity: 1 CPT4 Code: Description: ICD-10 Diagnosis Description 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 layer Modifier: exposed Quantity: CPT4 Code: [id number] Description: [id number] - DEB SUBQ TISS EA ADDL 20CM Modifier: Quantity: 1 CPT4 Code: Description: ICD-10 Diagnosis Description 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 layer Modifier: exposed Quantity: Physician Procedures CPT4 [id number] Description: 11042 - WC PHYS SUBQ TISS 20 SQ CM Modifier: Quantity: 1 CPT4 Code: Description: ICD-10 Diagnosis Description 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 layer Modifier:  exposed Quantity: CPT4 [id number] Description: 11045 - WC PHYS SUBQ TISS EA ADDL 20 CM Modifier: Quantity: 1 CPT4 Code: Description: ICD-10 Diagnosis Description L97.822 Non-pressure chronic ulcer of  other part of left lower leg with fat layer Modifier: exposed Quantity: Electronic Signature(s) Signed: 07/18/2020 4:58:15 PM By: Worthy Keeler PA-C Previous Signature: 07/18/2020 12:41:55 PM Version By: Georges Mouse, Minus Breeding RN Entered By: Worthy Keeler on 07/18/2020 16:58:15

## 2020-07-25 ENCOUNTER — Other Ambulatory Visit: Payer: Self-pay

## 2020-07-25 ENCOUNTER — Encounter: Payer: Medicaid Other | Admitting: Physician Assistant

## 2020-07-25 DIAGNOSIS — L97822 Non-pressure chronic ulcer of other part of left lower leg with fat layer exposed: Secondary | ICD-10-CM | POA: Diagnosis not present

## 2020-07-25 NOTE — Progress Notes (Signed)
Whisenant, Deegan L. ([id number]) Visit Report for 07/25/2020 Arrival Information Details Patient Name: GUAY, Tiron L. Date of Service: 07/25/2020 10:00 AM Medical Record Number: [id number] Patient Account Number: [id number] Date of Birth/Sex: 1961/03/07 (60 y.o. M) Treating RN: Carlene Coria Primary Care Provider: Dorisann Frames Other Clinician: Referring Provider: Dorisann Frames Treating Provider/Extender: [name] in Treatment: 3 Visit Information History Since Last Visit Has Dressing in Place as Prescribed: Yes Patient Arrived: Wheel Chair Has Compression in Place as Prescribed: Yes Arrival Time: 10:01 Pain Present Now: No Accompanied By: transport Transfer Assistance: None Patient Identification Verified: Yes Secondary Verification Process Completed: Yes Patient Requires Transmission-Based Precautions: No Patient Has Alerts: Yes Patient Alerts: 07/25/2020 ABI left Alanson>220 Electronic Signature(s) Signed: 07/25/2020 4:52:24 PM By: Georges Mouse, Minus Breeding RN Entered By: Georges Mouse, Kenia on 07/25/2020 11:09:29 Prosise, Iverson L. ([id number]) -------------------------------------------------------------------------------- Clinic Level of Care Assessment Details Patient Name: Preble, Jameire L. Date of Service: 07/25/2020 10:00 AM Medical Record Number: [id number] Patient Account Number: [id number] Date of Birth/Sex: February 07, 1961 (60 y.o. M) Treating RN: Dolan Amen Primary Care Provider: Dorisann Frames Other Clinician: Referring Provider: Dorisann Frames Treating Provider/Extender: [name] in Treatment: 3 Clinic Level of Care Assessment Items TOOL 1 Quantity Score _0  - Use when EandM and Procedure is performed on INITIAL visit 0 ASSESSMENTS - Nursing Assessment / Reassessment _1  - General Physical Exam (combine w/ comprehensive assessment (listed just below) when performed on new 0 pt. evals) _2  - 0 Comprehensive Assessment (HX, ROS, Risk  Assessments, Wounds Hx, etc.) ASSESSMENTS - Wound and Skin Assessment / Reassessment _3  - Dermatologic / Skin Assessment (not related to wound area) 0 ASSESSMENTS - Ostomy and/or Continence Assessment and Care _4  - Incontinence Assessment and Management 0 _5  - 0 Ostomy Care Assessment and Management (repouching, etc.) PROCESS - Coordination of Care _6  - Simple Patient / Family Education for ongoing care 0 _7  - 0 Complex (extensive) Patient / Family Education for ongoing care _8  - 0 Staff obtains Programmer, systems, Records, Test Results / Process Orders _9  - 0 Staff telephones HHA, Nursing Homes / Clarify orders / etc _10  - 0 Routine Transfer to another Facility (non-emergent condition) _11  - 0 Routine Hospital Admission (non-emergent condition) _12  - 0 New Admissions / Biomedical engineer / Ordering NPWT, Apligraf, etc. _13  - 0 Emergency Hospital Admission (emergent condition) PROCESS - Special Needs _14  - Pediatric / Minor Patient Management 0 _15  - 0 Isolation Patient Management _16  - 0 Hearing / Language / Visual special needs _17  - 0 Assessment of Community assistance (transportation, D/C planning, etc.) _18  - 0 Additional assistance / Altered mentation _19  - 0 Support Surface(s) Assessment (bed, cushion, seat, etc.) INTERVENTIONS - Miscellaneous _20  - External ear exam 0 _21  - 0 Patient Transfer (multiple staff / Civil Service fast streamer / Similar devices) _22  - 0 Simple Staple / Suture removal (25 or less) _23  - 0 Complex Staple / Suture removal (26 or more) _24  - 0 Hypo/Hyperglycemic Management (do not check if billed separately) _25  - 0 Ankle / Brachial Index (ABI) - do not check if billed separately Has the patient been seen at the hospital within the last three years: Yes Total Score: 0 Level Of Care: ____ Bartek, Morris L. ([id number]) Electronic Signature(s) Signed: 07/25/2020 4:52:24 PM By: Georges Mouse, Minus Breeding RN Entered By: Georges Mouse, Minus Breeding on 07/25/2020 10:56:53 Prioleau,  Bairon L. ([id number]) -------------------------------------------------------------------------------- Encounter Discharge Information Details Patient Name: Vanecek, Tod L. Date of Service: 07/25/2020 10:00 AM Medical Record Number: [id number] Patient Account Number: [id number] Date of  Birth/Sex: [birth year] (60 y.o. M) Treating RN: Dolan Amen Primary Care Provider: Dorisann Frames Other Clinician: Referring Provider: Dorisann Frames Treating Provider/Extender: [name] in Treatment: 3 Encounter Discharge Information Items Post Procedure Vitals Discharge Condition: Stable Temperature (F): 97.9 Ambulatory Status: Wheelchair Pulse (bpm): 79 Discharge Destination: Home Health Respiratory Rate (breaths/min): 20 Orders Sent: Yes Blood Pressure (mmHg): 120/65 Transportation: Private Auto Accompanied By: caregiver Schedule Follow-up Appointment: Yes Clinical Summary of Care: Electronic Signature(s) Signed: 07/25/2020 4:52:24 PM By: Georges Mouse, Minus Breeding RN Entered By: Georges Mouse, Kenia on 07/25/2020 10:58:09 Picardi, Blanca L. ([id number]) -------------------------------------------------------------------------------- Lower Extremity Assessment Details Patient Name: Probus, Ramces L. Date of Service: 07/25/2020 10:00 AM Medical Record Number: [id number] Patient Account Number: [id number] Date of Birth/Sex: 02-Jun-1960 (60 y.o. M) Treating RN: Carlene Coria Primary Care Provider: Dorisann Frames Other Clinician: Referring Provider: Dorisann Frames Treating Provider/Extender: [name] in Treatment: 3 Edema Assessment Assessed: [Left: No] [Right: No] [Left: Edema] [Right: :] Calf Left: Right: Point of Measurement: 39 cm From Medial Instep 36 cm Ankle Left: Right: Point of Measurement: 10 cm From Medial Instep 24.6 cm Knee To Floor Left: Right: From Medial Instep 51 cm Vascular Assessment Pulses: Dorsalis Pedis Palpable: [Left:Yes] Electronic  Signature(s) Signed: 07/25/2020 11:27:08 AM By: Carlene Coria RN Entered By: Carlene Coria on 07/25/2020 10:20:42 Mogensen, Hart L. ([id number]) -------------------------------------------------------------------------------- Multi Wound Chart Details Patient Name: Boulos, Reginold L. Date of Service: 07/25/2020 10:00 AM Medical Record Number: [id number] Patient Account Number: [id number] Date of Birth/Sex: [birth year] (60 y.o. M) Treating RN: Dolan Amen Primary Care Provider: Dorisann Frames Other Clinician: Referring Provider: Dorisann Frames Treating Provider/Extender: [name] in Treatment: 3 Vital Signs Height(in): 75 Pulse(bpm): 39 Weight(lbs): 190 Blood Pressure(mmHg): 120/65 Body Mass Index(BMI): 24 Temperature(F): 97.9 Respiratory Rate(breaths/min): 20 Photos: [N/A:N/A] Wound Location: Left, Anterior Lower Leg N/A N/A Wounding Event: Laceration N/A N/A Primary Etiology: Trauma, Other N/A N/A Comorbid History: Chronic Obstructive Pulmonary N/A N/A Disease (COPD), Hypertension Date Acquired: 06/18/2020 N/A N/A Weeks of Treatment: 3 N/A N/A Wound Status: Open N/A N/A Measurements L x W x D (cm) 3.3x5x1 N/A N/A Area (cm) : 12.959 N/A N/A Volume (cm) : 12.959 N/A N/A % Reduction in Area: 76.00% N/A N/A % Reduction in Volume: 40.00% N/A N/A Classification: Full Thickness Without Exposed N/A N/A Support Structures Exudate Amount: Large N/A N/A Exudate Type: Purulent N/A N/A Exudate Color: yellow, brown, green N/A N/A Wound Margin: Flat and Intact N/A N/A Granulation Amount: Medium (34-66%) N/A N/A Granulation Quality: Pink, Hyper-granulation N/A N/A Necrotic Amount: Medium (34-66%) N/A N/A Exposed Structures: Fat Layer (Subcutaneous Tissue): N/A N/A Yes Fascia: No Tendon: No Muscle: No Joint: No Bone: No Epithelialization: None N/A N/A Treatment Notes Electronic Signature(s) Signed: 07/25/2020 4:52:24 PM By: Georges Mouse, Minus Breeding RN Entered By:  Georges Mouse, Kenia on 07/25/2020 10:49:24 Downs, Dewel L. ([id number]) -------------------------------------------------------------------------------- Multi-Disciplinary Care Plan Details Patient Name: Thivierge, Vyom L. Date of Service: 07/25/2020 10:00 AM Medical Record Number: [id number] Patient Account Number: [id number] Date of Birth/Sex: 1960-12-16 (60 y.o. M) Treating RN: Dolan Amen Primary Care Provider: Dorisann Frames Other Clinician: Referring Provider: Dorisann Frames Treating Provider/Extender: [name] in Treatment: 3 Active Inactive Wound/Skin Impairment Nursing Diagnoses: Impaired tissue integrity Goals: Patient/caregiver will verbalize understanding of skin care regimen Date Initiated: 07/04/2020 Date Inactivated: 07/18/2020 Target Resolution Date: 07/04/2020 Goal Status: Met Ulcer/skin breakdown will have a volume reduction of 30% by week 4 Date Initiated: 07/04/2020 Target Resolution Date: 08/01/2020 Goal Status: Active Ulcer/skin breakdown will have a volume reduction  of 50% by week 8 Date Initiated: 07/04/2020 Target Resolution Date: 09/01/2020 Goal Status: Active Interventions: Assess patient/caregiver ability to obtain necessary supplies Assess patient/caregiver ability to perform ulcer/skin care regimen upon admission and as needed Assess ulceration(s) every visit Treatment Activities: Skin care regimen initiated : 07/04/2020 Topical wound management initiated : 07/04/2020 Notes: Electronic Signature(s) Signed: 07/25/2020 4:52:24 PM By: Georges Mouse, Minus Breeding RN Entered By: Georges Mouse, Minus Breeding on 07/25/2020 10:49:06 Jessee, Earley L. ([id number]) -------------------------------------------------------------------------------- Pain Assessment Details Patient Name: Nanninga, Altus L. Date of Service: 07/25/2020 10:00 AM Medical Record Number: [id number] Patient Account Number: [id number] Date of Birth/Sex: 06-27-1960 (60 y.o. M) Treating RN:  Carlene Coria Primary Care Provider: Dorisann Frames Other Clinician: Referring Provider: Dorisann Frames Treating Provider/Extender: [name] in Treatment: 3 Active Problems Location of Pain Severity and Description of Pain Patient Has Paino No Site Locations Pain Management and Medication Current Pain Management: Notes Patient denies pain at this time. Electronic Signature(s) Signed: 07/25/2020 11:27:08 AM By: Carlene Coria RN Entered By: Carlene Coria on 07/25/2020 10:06:58 Crocker, Ruxin L. ([id number]) -------------------------------------------------------------------------------- Patient/Caregiver Education Details Patient Name: Szilagyi, Collier L. Date of Service: 07/25/2020 10:00 AM Medical Record Number: [id number] Patient Account Number: [id number] Date of Birth/Gender: Apr 13, 1961 (60 y.o. M) Treating RN: Dolan Amen Primary Care Physician: Dorisann Frames Other Clinician: Referring Physician: Dorisann Frames Treating Physician/Extender: [name] in Treatment: 3 Education Assessment Education Provided To: Caregiver Education Topics Provided Wound/Skin Impairment: Methods: Explain/Verbal Responses: State content correctly Electronic Signature(s) Signed: 07/25/2020 4:52:24 PM By: Georges Mouse, Minus Breeding RN Entered By: Georges Mouse, Minus Breeding on 07/25/2020 10:57:10 Deriso, Roniel L. ([id number]) -------------------------------------------------------------------------------- Wound Assessment Details Patient Name: Pavone, Kayveon L. Date of Service: 07/25/2020 10:00 AM Medical Record Number: [id number] Patient Account Number: [id number] Date of Birth/Sex: November 24, 1960 (60 y.o. M) Treating RN: Dolan Amen Primary Care Provider: Dorisann Frames Other Clinician: Referring Provider: Dorisann Frames Treating Provider/Extender: [name] in Treatment: 3 Wound Status Wound Number: 1 Primary Trauma, Other Etiology: Wound Location: Left,  Anterior Lower Leg Wound Status: Open Wounding Event: Laceration Comorbid Chronic Obstructive Pulmonary Disease (COPD), Date Acquired: 06/18/2020 History: Hypertension Weeks Of Treatment: 3 Clustered Wound: No Photos Wound Measurements Length: (cm) 3.3 Width: (cm) 8.5 Depth: (cm) 1 Area: (cm) 22.03 Volume: (cm) 22.03 % Reduction in Area: 59.2% % Reduction in Volume: -2% Epithelialization: None Tunneling: No Undermining: No Wound Description Classification: Full Thickness Without Exposed Support Structures Wound Margin: Flat and Intact Exudate Amount: Large Exudate Type: Purulent Exudate Color: yellow, brown, green Foul Odor After Cleansing: No Slough/Fibrino Yes Wound Bed Granulation Amount: Medium (34-66%) Exposed Structure Granulation Quality: Pink, Hyper-granulation Fascia Exposed: No Necrotic Amount: Medium (34-66%) Fat Layer (Subcutaneous Tissue) Exposed: Yes Tendon Exposed: No Muscle Exposed: No Joint Exposed: No Bone Exposed: No Treatment Notes Wound #1 (Lower Leg) Wound Laterality: Left, Anterior Cleanser Normal Saline Discharge Instruction: Wash your hands with soap and water. Remove old dressing, discard into plastic bag and place into trash. Cleanse the wound with Normal Saline prior to applying a clean dressing using gauze sponges, not tissues or cotton balls. Do not scrub or use excessive force. Pat dry using gauze sponges, not tissue or cotton balls. Wesch, Tahjae L. ([id number]) Wound Cleanser Discharge Instruction: Wash your hands with soap and water. Remove old dressing, discard into plastic bag and place into trash. Cleanse the wound with Wound Cleanser prior to applying a clean dressing using gauze sponges, not tissues or cotton balls. Do not scrub or use excessive force. Pat dry using gauze sponges,  not tissue or cotton balls. Peri-Wound Care Topical Primary Dressing Hydrofera Blue Ready Transfer Foam, 4x5 (in/in) Discharge Instruction:  Apply Hydrofera Blue Ready to wound bed as directed Secondary Dressing Xtrasorb Medium 4x5 (in/in) Discharge Instruction: Apply to wound as directed. Do not cut. Secured With Compression Wrap Unna Boot 4x10 (in/yd) Discharge Instruction: Unna Paste, Kerlix and Coban from base of toes to three finger widths below bend in knee. Compression Stockings Add-Ons Electronic Signature(s) Signed: 07/25/2020 4:52:24 PM By: Georges Mouse, Minus Breeding RN Entered By: Georges Mouse, Minus Breeding on 07/25/2020 11:21:57 Romm, Zane L. ([id number]) -------------------------------------------------------------------------------- Vitals Details Patient Name: Sackrider, Rollan L. Date of Service: 07/25/2020 10:00 AM Medical Record Number: [id number] Patient Account Number: [id number] Date of Birth/Sex: [birth year] (60 y.o. M) Treating RN: Carlene Coria Primary Care Provider: Dorisann Frames Other Clinician: Referring Provider: Dorisann Frames Treating Provider/Extender: [name] in Treatment: 3 Vital Signs Time Taken: 10:06 Temperature (F): 97.9 Height (in): 75 Pulse (bpm): 79 Weight (lbs): 190 Respiratory Rate (breaths/min): 20 Body Mass Index (BMI): 23.7 Blood Pressure (mmHg): 120/65 Reference Range: 80 - 120 mg / dl Electronic Signature(s) Signed: 07/25/2020 11:27:08 AM By: Carlene Coria RN Entered By: Carlene Coria on 07/25/2020 10:06:41

## 2020-07-25 NOTE — Progress Notes (Addendum)
Kurtenbach, Lavaris L. ([id number]) Visit Report for 07/25/2020 Chief Complaint Document Details Patient Name: Todd Brown, [name] Date of Service: 07/25/2020 10:00 AM Medical Record Number: [id number] Patient Account Number: [id number] Date of Birth/Sex: 12-Apr-1961 (60 y.o. M) Treating RN: Dolan Amen Primary Care Provider: Dorisann Frames Other Clinician: Referring Provider: Dorisann Frames Treating Provider/Extender: [name] in Treatment: 3 Information Obtained from: Patient Chief Complaint Left LE Traumatic Ulcer Electronic Signature(s) Signed: 07/25/2020 10:14:45 AM By: Worthy Keeler PA-C Entered By: Worthy Keeler on 07/25/2020 10:14:45 Sapien, Deleon L. ([id number]) -------------------------------------------------------------------------------- Debridement Details Patient Name: [name], [name] Date of Service: 07/25/2020 10:00 AM Medical Record Number: [id number] Patient Account Number: [id number] Date of Birth/Sex: [birth year] (60 y.o. M) Treating RN: Dolan Amen Primary Care Provider: Dorisann Frames Other Clinician: Referring Provider: Dorisann Frames Treating Provider/Extender: [name] in Treatment: 3 Debridement Performed for Wound #1 Left,Anterior Lower Leg Assessment: Performed By: Physician Tommie Sams., PA-C Debridement Type: Debridement Level of Consciousness (Pre- Awake and Alert procedure): Pre-procedure Verification/Time Out Yes - 10:50 Taken: Start Time: 10:50 Total Area Debrided (L x W): 3.3 (cm) x 5.5 (cm) = 18.15 (cm) Tissue and other material Viable, Non-Viable, Slough, Subcutaneous, Slough debrided: Level: Skin/Subcutaneous Tissue Debridement Description: Excisional Instrument: Curette Bleeding: Minimum Hemostasis Achieved: Pressure Response to Treatment: Procedure was tolerated well Level of Consciousness (Post- Awake and Alert procedure): Post Debridement Measurements of Total Wound Length: (cm) 9.5 Width:  (cm) 5 Depth: (cm) 1.1 Volume: (cm) 41.037 Character of Wound/Ulcer Post Debridement: Stable Post Procedure Diagnosis Same as Pre-procedure Electronic Signature(s) Signed: 07/25/2020 4:52:24 PM By: Charlett Nose RN Signed: 07/25/2020 5:47:45 PM By: Worthy Keeler PA-C Entered By: Georges Mouse, Minus Breeding on 07/25/2020 10:55:09 Corado, Ned L. ([id number]) -------------------------------------------------------------------------------- HPI Details Patient Name: [name], [name] Date of Service: 07/25/2020 10:00 AM Medical Record Number: [id number] Patient Account Number: [id number] Date of Birth/Sex: [birth year] (60 y.o. M) Treating RN: Dolan Amen Primary Care Provider: Dorisann Frames Other Clinician: Referring Provider: Dorisann Frames Treating Provider/Extender: [name] in Treatment: 3 History of Present Illness HPI Description: 07/04/2020 patient presents today for initial evaluation here in our clinic. He is actually a ward of the state. With that being said he is only been in the group home that he is currently staying in for the past few weeks in fact it was only about 2 weeks. He was seen in the ED for evaluation on 06/18/2020 due to an injury that he sustained to his left lower leg. He tells me that he got up in the middle the night to go to the bathroom or something and tripped over a wheelchair causing a significant skin tear/laceration to the leg. Unfortunately this was sutured but has dehisced in some areas and appears to have a significant hematoma and others. The proximal portion of the wound has actually necrosis and the skin flap is can have to be removed. The inferior portion appears to still be healthy and has reattached to some degree along the suture line this is still somewhat dehiscing and is can require some sharp debridement. Fortunately there does not appear to be any signs of active infection at this time. With that being said I do believe  that we may want to put him back on some preventative antibiotics, Keflex, just to ensure following the debridement today that he is going to be infection free. Released to the best of our ability. The patient does have a history of end-stage renal disease that he is on  dialysis. He also has schizophrenia at this point is now a ward of the state as it was deemed that he was not competent to be able to completely care for himself. Apparently his living condition prior to going to the group home was very poor. With that being said he seems to be doing much better now in my opinion and I do feel like that the location is that is a very good facility. The patient is going require some sharp debridement today and we did talk to [name] who is at the [place] to gain approval for the patient. This was for debridement in particular. We did gain that approval and it was myself in [place] who witnessed this. Upon evaluation today patient appears to be doing a little better in regard to his wound currently. Fortunately there is no signs of active infection at this time. No fevers, chills, nausea, vomiting, or diarrhea. He has been taking the Keflex without complication. With that being said I am concerned today that he did develop a blood clot under the skin flap that skin to prevent him healing. I think we need to do something to try to prevent this from continuing to be an issue. For that reason I discussed with him the possibility of doing a Unna boot wrap which I think it was going to be the best thing for him as far as healing is concerned he is in agreement with the plan. 07/18/2020. Upon evaluation today patient's wound bed actually showed signs of improvement compared to previous evaluation. I feel like he is granulating in and does seem to be healing quite nicely. Fortunately there is no evidence of active infection which is great news and overall I think that he is headed in  the right direction. 07/25/2020 upon evaluation today patient's wound actually appears to show signs of good improvement. I am actually very pleased with where we stand today. He is actually showing some signs of hypergranulation and I think he could benefit from switching to [facility]. That was discussed with the patient to let them know what was going on today as well. Electronic Signature(s) Signed: 07/25/2020 5:38:31 PM By: Worthy Keeler PA-C Entered By: Worthy Keeler on 07/25/2020 17:38:30 Cotto, Sacramento L. ([id number]) -------------------------------------------------------------------------------- Physical Exam Details Patient Name: Zern, Pilar L. Date of Service: 07/25/2020 10:00 AM Medical Record Number: [id number] Patient Account Number: [id number] Date of Birth/Sex: [birth year] (60 y.o. M) Treating RN: Dolan Amen Primary Care Provider: Dorisann Frames Other Clinician: Referring Provider: Dorisann Frames Treating Provider/Extender: [name] in Treatment: 3 Constitutional Well-nourished and well-hydrated in no acute distress. Respiratory normal breathing without difficulty. Psychiatric this patient is able to make decisions and demonstrates good insight into disease process. Alert and Oriented x 3. pleasant and cooperative. Notes Patient's wound bed actually showed signs of good granulation and epithelization a lot of areas he did have some slough and biofilm buildup that did require sharp debridement and this was performed today without pain or complication he actually appears to be doing about the best he has over the past several weeks from a pain standpoint. In fact he did not even flinch during the debridement process and did not seem to have any significant pain which was great news. Electronic Signature(s) Signed: 07/25/2020 5:38:59 PM By: Worthy Keeler PA-C Entered By: Worthy Keeler on 07/25/2020 17:38:59 Castello, Bruin L.  ([id number]) -------------------------------------------------------------------------------- Physician Orders Details Patient Name: Belcher, Plummer L. Date of Service:  07/25/2020 10:00 AM Medical Record Number: [id number] Patient Account Number: [id number] Date of Birth/Sex: 02-10-1961 (59 y.o. M) Treating RN: Dolan Amen Primary Care Provider: Dorisann Frames Other Clinician: Referring Provider: Dorisann Frames Treating Provider/Extender: [name] in Treatment: 3 Verbal / Phone Orders: No Diagnosis Coding ICD-10 Coding Code Description [phone] Unspecified open wound, left lower leg, initial encounter 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 layer exposed N18.6 End stage renal disease Z99.2 Dependence on renal dialysis F20.89 Other schizophrenia Follow-up Appointments Wound #1 Left,Anterior Lower Leg o Return Appointment in 1 week. Home Health Wound #1 [place]: [facility] o ADMIT to [facility] for wound care. May utilize formulary equivalent dressing for wound treatment orders unless otherwise specified. Home Health Nurse may visit PRN to address patientos wound care needs. o Scheduled days for dressing changes to be completed; exception, patient has scheduled wound care visit that day. - Dressing changes Tuesday/Thursday in office/Saturday o **Please direct any NON-WOUND related issues/requests for orders to patient's Primary Care Physician. **If current dressing causes regression in wound condition, may D/C ordered dressing product/s and apply Normal Saline Moist Dressing daily until next [place] or Other MD appointment. **Notify [facility] of regression in wound condition at [phone]. Anesthetic (Use 'Patient Medications' Section for Anesthetic Order Entry) Wound #1 Left,Anterior Lower Leg o Lidocaine applied to wound bed o Benzocaine applied to wound  bed. Wound Treatment Wound #1 - Lower Leg Wound Laterality: Left, Anterior Cleanser: Normal Saline 3 x Per Week/30 Days Discharge Instructions: Wash your hands with soap and water. Remove old dressing, discard into plastic bag and place into trash. Cleanse the wound with Normal Saline prior to applying a clean dressing using gauze sponges, not tissues or cotton balls. Do not scrub or use excessive force. Pat dry using gauze sponges, not tissue or cotton balls. Cleanser: Wound Cleanser 3 x Per Week/30 Days Discharge Instructions: Wash your hands with soap and water. Remove old dressing, discard into plastic bag and place into trash. Cleanse the wound with Wound Cleanser prior to applying a clean dressing using gauze sponges, not tissues or cotton balls. Do not scrub or use excessive force. Pat dry using gauze sponges, not tissue or cotton balls. Primary Dressing: Hydrofera Blue Ready Transfer Foam, 4x5 (in/in) 3 x Per Week/30 Days Discharge Instructions: Apply Hydrofera Blue Ready to wound bed as directed Secondary Dressing: Xtrasorb Medium 4x5 (in/in) 3 x Per Week/30 Days Discharge Instructions: Apply to wound as directed. Do not cut. Compression Wrap: Profore Lite LF 3 Multilayer Compression Bandaging System 3 x Per Week/30 Days Discharge Instructions: Apply 3 multi-layer wrap as prescribed. Camposano, Dearies L. ([id number]) Electronic Signature(s) Signed: 07/25/2020 4:52:24 PM By: Georges Mouse, Minus Breeding RN Signed: 07/25/2020 5:47:45 PM By: Worthy Keeler PA-C Entered By: Georges Mouse, Minus Breeding on 07/25/2020 11:10:16 Kube, Rajendra L. ([id number]) -------------------------------------------------------------------------------- Problem List Details Patient Name: Thackston, Estiven L. Date of Service: 07/25/2020 10:00 AM Medical Record Number: [id number] Patient Account Number: [id number] Date of Birth/Sex: [birth year] (60 y.o. M) Treating RN: Dolan Amen Primary Care Provider: Dorisann Frames  Other Clinician: Referring Provider: Dorisann Frames Treating Provider/Extender: [name] in Treatment: 3 Active Problems ICD-10 Encounter Code Description Active Date MDM Diagnosis S81.802A Unspecified open wound, left lower leg, initial encounter 07/04/2020 No Yes 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 layer 07/04/2020 No Yes exposed N18.6 End stage renal disease 07/04/2020 No Yes  Z99.2 Dependence on renal dialysis 07/04/2020 No Yes F20.89 Other schizophrenia 07/04/2020 No Yes Inactive Problems Resolved Problems Electronic Signature(s) Signed: 07/25/2020 10:14:37 AM By: Worthy Keeler PA-C Entered By: Worthy Keeler on 07/25/2020 10:14:36 Dave, Kazumi L. ([id number]) -------------------------------------------------------------------------------- Progress Note Details Patient Name: Riso, Jerzy L. Date of Service: 07/25/2020 10:00 AM Medical Record Number: [id number] Patient Account Number: [id number] Date of Birth/Sex: [birth year] (60 y.o. M) Treating RN: Dolan Amen Primary Care Provider: Dorisann Frames Other Clinician: Referring Provider: Dorisann Frames Treating Provider/Extender: [name] in Treatment: 3 Subjective Chief Complaint Information obtained from Patient Left LE Traumatic Ulcer History of Present Illness (HPI) 07/04/2020 patient presents today for initial evaluation here in our clinic. He is actually a ward of the state. With that being said he is only been in the group home that he is currently staying in for the past few weeks in fact it was only about 2 weeks. He was seen in the ED for evaluation on 06/18/2020 due to an injury that he sustained to his left lower leg. He tells me that he got up in the middle the night to go to the bathroom or something and tripped over a wheelchair causing a significant skin tear/laceration to the leg. Unfortunately this was sutured but has dehisced in some areas and  appears to have a significant hematoma and others. The proximal portion of the wound has actually necrosis and the skin flap is can have to be removed. The inferior portion appears to still be healthy and has reattached to some degree along the suture line this is still somewhat dehiscing and is can require some sharp debridement. Fortunately there does not appear to be any signs of active infection at this time. With that being said I do believe that we may want to put him back on some preventative antibiotics, Keflex, just to ensure following the debridement today that he is going to be infection free. Released to the best of our ability. The patient does have a history of end-stage renal disease that he is on dialysis. He also has schizophrenia at this point is now a ward of the state as it was deemed that he was not competent to be able to completely care for himself. Apparently his living condition prior to going to the group home was very poor. With that being said he seems to be doing much better now in my opinion and I do feel like that the location is that is a very good facility. The patient is going require some sharp debridement today and we did talk to [name] who is at the [facility] to gain approval for the patient. This was for debridement in particular. We did gain that approval and it was myself in [place] who witnessed this. Upon evaluation today patient appears to be doing a little better in regard to his wound currently. Fortunately there is no signs of active infection at this time. No fevers, chills, nausea, vomiting, or diarrhea. He has been taking the Keflex without complication. With that being said I am concerned today that he did develop a blood clot under the skin flap that skin to prevent him healing. I think we need to do something to try to prevent this from continuing to be an issue. For that reason I discussed with him the possibility of doing  a Unna boot wrap which I think it was going to be the best thing for him as far as healing  is concerned he is in agreement with the plan. 07/18/2020. Upon evaluation today patient's wound bed actually showed signs of improvement compared to previous evaluation. I feel like he is granulating in and does seem to be healing quite nicely. Fortunately there is no evidence of active infection which is great news and overall I think that he is headed in the right direction. 07/25/2020 upon evaluation today patient's wound actually appears to show signs of good improvement. I am actually very pleased with where we stand today. He is actually showing some signs of hypergranulation and I think he could benefit from switching to [facility]. That was discussed with the patient to let them know what was going on today as well. Objective Constitutional Well-nourished and well-hydrated in no acute distress. Vitals Time Taken: 10:06 AM, Height: 75 in, Weight: 190 lbs, BMI: 23.7, Temperature: 97.9 F, Pulse: 79 bpm, Respiratory Rate: 20 breaths/min, Blood Pressure: 120/65 mmHg. Respiratory normal breathing without difficulty. Psychiatric this patient is able to make decisions and demonstrates good insight into disease process. Alert and Oriented x 3. pleasant and cooperative. General Notes: Patient's wound bed actually showed signs of good granulation and epithelization a lot of areas he did have some slough and biofilm buildup that did require sharp debridement and this was performed today without pain or complication he actually appears to be doing about the best he has over the past several weeks from a pain standpoint. In fact he did not even flinch during the debridement process and did not seem to have any significant pain which was great news. Nath, Teja L. ([id number]) Integumentary (Hair, Skin) Wound #1 status is Open. Original cause of wound was Laceration. The date acquired was: 06/18/2020.  The wound has been in treatment 3 weeks. The wound is located on the Left,Anterior Lower Leg. The wound measures 3.3cm length x 8.5cm width x 1cm depth; 22.03cm^2 area and 22.03cm^3 volume. There is Fat Layer (Subcutaneous Tissue) exposed. There is no tunneling or undermining noted. There is a large amount of purulent drainage noted. The wound margin is flat and intact. There is medium (34-66%) pink, hyper - granulation within the wound bed. There is a medium (34-66%) amount of necrotic tissue within the wound bed. Assessment Active Problems ICD-10 Unspecified open wound, left lower leg, initial encounter Non-pressure chronic ulcer of other part of left lower leg with fat layer exposed End stage renal disease Dependence on renal dialysis Other schizophrenia Procedures Wound #1 Pre-procedure diagnosis of Wound #1 is a Trauma, Other located on the Left,Anterior Lower Leg . There was a Excisional Skin/Subcutaneous Tissue Debridement with a total area of 18.15 sq cm performed by Tommie Sams., PA-C. With the following instrument(s): Curette to remove Viable and Non-Viable tissue/material. Material removed includes Subcutaneous Tissue and Slough and. A time out was conducted at 10:50, prior to the start of the procedure. A Minimum amount of bleeding was controlled with Pressure. The procedure was tolerated well. Post Debridement Measurements: 9.5cm length x 5cm width x 1.1cm depth; 41.037cm^3 volume. Character of Wound/Ulcer Post Debridement is stable. Post procedure Diagnosis Wound #1: Same as Pre-Procedure Plan Follow-up Appointments: Wound #1 Left,Anterior Lower Leg: Return Appointment in 1 week. Home Health: Wound #1 Left,Anterior Lower Leg: [place]: [facility] ADMIT to [facility] for wound care. May utilize formulary equivalent dressing for wound treatment orders unless otherwise specified. Home Health Nurse may visit PRN to address patient s wound care needs. Scheduled  days for dressing changes to be completed;  exception, patient has scheduled wound care visit that day. - Dressing changes Tuesday/Thursday in office/Saturday **Please direct any NON-WOUND related issues/requests for orders to patient's Primary Care Physician. **If current dressing causes regression in wound condition, may D/C ordered dressing product/s and apply Normal Saline Moist Dressing daily until next [place] or Other MD appointment. **Notify Wound Healing Center of regression in wound condition at [phone]. Anesthetic (Use 'Patient Medications' Section for Anesthetic Order Entry): Wound #1 Left,Anterior Lower Leg: Lidocaine applied to wound bed Benzocaine applied to wound bed. WOUND #1: - Lower Leg Wound Laterality: Left, Anterior Cleanser: Normal Saline 3 x Per Week/30 Days Discharge Instructions: Wash your hands with soap and water. Remove old dressing, discard into plastic bag and place into trash. Cleanse the wound with Normal Saline prior to applying a clean dressing using gauze sponges, not tissues or cotton balls. Do not scrub or use excessive force. Pat dry using gauze sponges, not tissue or cotton balls. Cleanser: Wound Cleanser 3 x Per Week/30 Days Discharge Instructions: Wash your hands with soap and water. Remove old dressing, discard into plastic bag and place into trash. Cleanse the wound with Wound Cleanser prior to applying a clean dressing using gauze sponges, not tissues or cotton balls. Do not scrub or use excessive force. Pat dry using gauze sponges, not tissue or cotton balls. Primary Dressing: Hydrofera Blue Ready Transfer Foam, 4x5 (in/in) 3 x Per Week/30 Days Discharge Instructions: Apply Hydrofera Blue Ready to wound bed as directed Secondary Dressing: Xtrasorb Medium 4x5 (in/in) 3 x Per Week/30 Days Discharge Instructions: Apply to wound as directed. Do not cut. Belloso, Marlan L. ([id number]) Compression Wrap: Profore Lite LF 3 Multilayer  Compression Bandaging System 3 x Per Week/30 Days Discharge Instructions: Apply 3 multi-layer wrap as prescribed. 1. I am going to recommend currently that we switch to [facility] dressing. I think this is can be a better way to go and will help the patient to achieve closure much more quickly. 2. I am also can recommend that we have the patient go ahead and continue with the compression wrap as I feel like this is doing a good job for him. We will however switch over to a 3 layer compression wrap. 3. I am can also recommend he continue to elevate his legs much as possible try to keep edema under good control. We will see patient back for reevaluation in 1 week here in the clinic. If anything worsens or changes patient will contact our office for additional recommendations. Electronic Signature(s) Signed: 07/25/2020 5:39:56 PM By: Worthy Keeler PA-C Entered By: Worthy Keeler on 07/25/2020 17:39:55 Sandell, Takeem L. ([id number]) -------------------------------------------------------------------------------- SuperBill Details Patient Name: Moscoso, Teven L. Date of Service: 07/25/2020 Medical Record Number: [id number] Patient Account Number: [id number] Date of Birth/Sex: [birth year] (60 y.o. M) Treating RN: Dolan Amen Primary Care Provider: Dorisann Frames Other Clinician: Referring Provider: Dorisann Frames Treating Provider/Extender: [name] in Treatment: 3 Diagnosis Coding ICD-10 Codes Code Description [phone] Unspecified open wound, left lower leg, initial encounter 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 layer exposed N18.6 End stage renal disease Z99.2 Dependence on renal dialysis F20.89 Other schizophrenia Facility Procedures CPT4 Code: [id number] Description: [id number] - DEB SUBQ TISSUE 20 SQ CM/< Modifier: Quantity: 1 CPT4 Code: Description: ICD-10 Diagnosis Description L97.822 Non-pressure chronic ulcer of other part of  left lower leg with fat layer Modifier: exposed Quantity: Physician Procedures CPT4 Code: [id number] Description: 11042 - WC PHYS SUBQ  TISS 20 SQ CM Modifier: Quantity: 1 CPT4 Code: Description: ICD-10 Diagnosis Description 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 layer Modifier: exposed Quantity: Electronic Signature(s) Signed: 07/25/2020 5:40:09 PM By: Worthy Keeler PA-C Previous Signature: 07/25/2020 4:52:24 PM Version By: Georges Mouse, Minus Breeding RN Entered By: Worthy Keeler on 07/25/2020 17:40:07

## 2020-08-01 ENCOUNTER — Encounter: Payer: Medicaid Other | Attending: Physician Assistant | Admitting: Physician Assistant

## 2020-08-01 ENCOUNTER — Other Ambulatory Visit: Payer: Self-pay

## 2020-08-01 DIAGNOSIS — S81812D Laceration without foreign body, left lower leg, subsequent encounter: Secondary | ICD-10-CM | POA: Insufficient documentation

## 2020-08-01 DIAGNOSIS — N186 End stage renal disease: Secondary | ICD-10-CM | POA: Diagnosis not present

## 2020-08-01 DIAGNOSIS — W228XXD Striking against or struck by other objects, subsequent encounter: Secondary | ICD-10-CM | POA: Insufficient documentation

## 2020-08-01 DIAGNOSIS — F209 Schizophrenia, unspecified: Secondary | ICD-10-CM | POA: Insufficient documentation

## 2020-08-01 DIAGNOSIS — I120 Hypertensive chronic kidney disease with stage 5 chronic kidney disease or end stage renal disease: Secondary | ICD-10-CM | POA: Insufficient documentation

## 2020-08-01 DIAGNOSIS — Z992 Dependence on renal dialysis: Secondary | ICD-10-CM | POA: Diagnosis not present

## 2020-08-01 DIAGNOSIS — L97822 Non-pressure chronic ulcer of other part of left lower leg with fat layer exposed: Secondary | ICD-10-CM | POA: Diagnosis not present

## 2020-08-01 DIAGNOSIS — L97929 Non-pressure chronic ulcer of unspecified part of left lower leg with unspecified severity: Secondary | ICD-10-CM | POA: Diagnosis present

## 2020-08-01 NOTE — Progress Notes (Addendum)
[name], [name] ([id number]) Visit Report for 08/01/2020 Chief Complaint Document Details Patient Name: [name], [name] Date of Service: 08/01/2020 9:15 AM Medical Record Number: [id number] Patient Account Number: [id number] Date of Birth/Sex: 1960-10-11 (60 y.o. M) Treating RN: Dolan Amen Primary Care Provider: Dorisann Frames Other Clinician: Jeanine Luz Referring Provider: Dorisann Frames Treating Provider/Extender: [name] in Treatment: 4 Information Obtained from: Patient Chief Complaint Left LE Traumatic Ulcer Electronic Signature(s) Signed: 08/01/2020 9:22:48 AM By: Worthy Keeler PA-C Entered By: Worthy Keeler on 08/01/2020 09:22:48 [name], [name] ([id number]) -------------------------------------------------------------------------------- HPI Details Patient Name: [name], [name] Date of Service: 08/01/2020 9:15 AM Medical Record Number: [id number] Patient Account Number: [id number] Date of Birth/Sex: May 06, 1961 (60 y.o. M) Treating RN: Dolan Amen Primary Care Provider: Dorisann Frames Other Clinician: Jeanine Luz Referring Provider: Dorisann Frames Treating Provider/Extender: [name] in Treatment: 4 History of Present Illness HPI Description: 07/04/2020 patient presents today for initial evaluation here in our clinic. He is actually a ward of the state. With that being said he is only been in the group home that he is currently staying in for the past few weeks in fact it was only about 2 weeks. He was seen in the ED for evaluation on 06/18/2020 due to an injury that he sustained to his left lower leg. He tells me that he got up in the middle the night to go to the bathroom or something and tripped over a wheelchair causing a significant skin tear/laceration to the leg. Unfortunately this was sutured but has dehisced in some areas and appears to have a significant hematoma and others. The proximal portion of the wound has actually  necrosis and the skin flap is can have to be removed. The inferior portion appears to still be healthy and has reattached to some degree along the suture line this is still somewhat dehiscing and is can require some sharp debridement. Fortunately there does not appear to be any signs of active infection at this time. With that being said I do believe that we may want to put him back on some preventative antibiotics, Keflex, just to ensure following the debridement today that he is going to be infection free. Released to the best of our ability. The patient does have a history of end-stage renal disease that he is on dialysis. He also has schizophrenia at this point is now a ward of the state as it was deemed that he was not competent to be able to completely care for himself. Apparently his living condition prior to going to the group home was very poor. With that being said he seems to be doing much better now in my opinion and I do feel like that the location is that is a very good facility. The patient is going require some sharp debridement today and we did talk to [name] who is at the [facility] to gain approval for the patient. This was for debridement in particular. We did gain that approval and it was myself in [place] who witnessed this. Upon evaluation today patient appears to be doing a little better in regard to his wound currently. Fortunately there is no signs of active infection at this time. No fevers, chills, nausea, vomiting, or diarrhea. He has been taking the Keflex without complication. With that being said I am concerned today that he did develop a blood clot under the skin flap that skin to prevent him healing. I think we need to  do something to try to prevent this from continuing to be an issue. For that reason I discussed with him the possibility of doing a Unna boot wrap which I think it was going to be the best thing for him as far as healing is  concerned he is in agreement with the plan. 07/18/2020. Upon evaluation today patient's wound bed actually showed signs of improvement compared to previous evaluation. I feel like he is granulating in and does seem to be healing quite nicely. Fortunately there is no evidence of active infection which is great news and overall I think that he is headed in the right direction. 07/25/2020 upon evaluation today patient's wound actually appears to show signs of good improvement. I am actually very pleased with where we stand today. He is actually showing some signs of hypergranulation and I think he could benefit from switching to [facility]. That was discussed with the patient to let them know what was going on today as well. 08/01/2020 upon evaluation today patient appears to be doing excellent in regard to his wound on his leg. Fortunately there is no signs of active infection at this time. No fevers, chills, nausea, vomiting, or diarrhea. Electronic Signature(s) Signed: 08/01/2020 9:49:59 AM By: Worthy Keeler PA-C Entered By: Worthy Keeler on 08/01/2020 09:49:59 [name], [name] ([id number]) -------------------------------------------------------------------------------- Physical Exam Details Patient Name: [name], [name] Date of Service: 08/01/2020 9:15 AM Medical Record Number: [id number] Patient Account Number: [id number] Date of Birth/Sex: 1960/09/29 (60 y.o. M) Treating RN: Dolan Amen Primary Care Provider: Dorisann Frames Other Clinician: Jeanine Luz Referring Provider: Dorisann Frames Treating Provider/Extender: [name] in Treatment: 4 Constitutional Well-nourished and well-hydrated in no acute distress. Respiratory normal breathing without difficulty. Psychiatric this patient is able to make decisions and demonstrates good insight into disease process. Alert and Oriented x 3. pleasant and cooperative. Notes Patient's wound bed showed signs of excellent  granulation epithelization. There is no signs of infection and overall I feel like he is making excellent progress no sharp debridement is even necessary today and overall measurements are significantly better fortunately down the medial aspect of the wound where there was a skin flap in depth here this appears to have completely healed which is great news. Electronic Signature(s) Signed: 08/01/2020 9:50:21 AM By: Worthy Keeler PA-C Entered By: Worthy Keeler on 08/01/2020 09:50:20 [name], [name] ([id number]) -------------------------------------------------------------------------------- Physician Orders Details Patient Name: [name], [name] Date of Service: 08/01/2020 9:15 AM Medical Record Number: [id number] Patient Account Number: [id number] Date of Birth/Sex: Jun 23, 1960 (61 y.o. M) Treating RN: Dolan Amen Primary Care Provider: Dorisann Frames Other Clinician: Jeanine Luz Referring Provider: Dorisann Frames Treating Provider/Extender: [name] in Treatment: 4 Verbal / Phone Orders: No Diagnosis Coding ICD-10 Coding Code Description [phone] Unspecified open wound, left lower leg, initial encounter 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 layer exposed N18.6 End stage renal disease Z99.2 Dependence on renal dialysis F20.89 Other schizophrenia Follow-up Appointments Wound #1 Left,Anterior Lower Leg o Return Appointment in 2 weeks. Home Health Wound #1 [place]: [facility] o ADMIT to [facility] for wound care. May utilize formulary equivalent dressing for wound treatment orders unless otherwise specified. Home Health Nurse may visit PRN to address patientos wound care needs. o Scheduled days for dressing changes to be completed; exception, patient has scheduled wound care visit that day. - Dressing changes Tuesday/Thursday in office/Saturday o **Please direct any NON-WOUND  related issues/requests for orders to patient's Primary Care Physician. **If current dressing causes regression in wound condition, may D/C ordered dressing product/s and apply Normal Saline Moist Dressing daily until next [place] or Other MD appointment. **Notify [facility] Center of regression in wound condition at [phone]. Anesthetic (Use 'Patient Medications' Section for Anesthetic Order Entry) Wound #1 Left,Anterior Lower Leg o Lidocaine applied to wound bed o Benzocaine applied to wound bed. Wound Treatment Wound #1 - Lower Leg Wound Laterality: Left, Anterior Cleanser: Normal Saline 3 x Per Week/30 Days Discharge Instructions: Wash your hands with soap and water. Remove old dressing, discard into plastic bag and place into trash. Cleanse the wound with Normal Saline prior to applying a clean dressing using gauze sponges, not tissues or cotton balls. Do not scrub or use excessive force. Pat dry using gauze sponges, not tissue or cotton balls. Cleanser: Wound Cleanser 3 x Per Week/30 Days Discharge Instructions: Wash your hands with soap and water. Remove old dressing, discard into plastic bag and place into trash. Cleanse the wound with Wound Cleanser prior to applying a clean dressing using gauze sponges, not tissues or cotton balls. Do not scrub or use excessive force. Pat dry using gauze sponges, not tissue or cotton balls. Primary Dressing: Hydrofera Blue Ready Transfer Foam, 4x5 (in/in) 3 x Per Week/30 Days Discharge Instructions: Apply Hydrofera Blue Ready to wound bed as directed Secondary Dressing: Xtrasorb Medium 4x5 (in/in) 3 x Per Week/30 Days Discharge Instructions: Apply to wound as directed. Do not cut. Compression Wrap: Profore Lite LF 3 Multilayer Compression Bandaging System 3 x Per Week/30 Days Discharge Instructions: Apply 3 multi-layer wrap as prescribed. [name], [name] ([id number]) Electronic Signature(s) Signed: 08/01/2020 2:54:27 PM By:  Georges Mouse, Minus Breeding RN Signed: 08/02/2020 11:30:15 AM By: Worthy Keeler PA-C Entered By: Georges Mouse, Minus Breeding on 08/01/2020 09:48:58 [name], [name] ([id number]) -------------------------------------------------------------------------------- Problem List Details Patient Name: [name], [name] Date of Service: 08/01/2020 9:15 AM Medical Record Number: [id number] Patient Account Number: [id number] Date of Birth/Sex: 02-25-1961 (60 y.o. M) Treating RN: Dolan Amen Primary Care Provider: Dorisann Frames Other Clinician: Jeanine Luz Referring Provider: Dorisann Frames Treating Provider/Extender: [name] in Treatment: 4 Active Problems ICD-10 Encounter Code Description Active Date MDM Diagnosis [phone] Unspecified open wound, left lower leg, initial encounter 07/04/2020 No Yes 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 layer 07/04/2020 No Yes exposed N18.6 End stage renal disease 07/04/2020 No Yes Z99.2 Dependence on renal dialysis 07/04/2020 No Yes F20.89 Other schizophrenia 07/04/2020 No Yes Inactive Problems Resolved Problems Electronic Signature(s) Signed: 08/01/2020 9:22:41 AM By: Worthy Keeler PA-C Entered By: Worthy Keeler on 08/01/2020 09:22:41 [name], [name] ([id number]) -------------------------------------------------------------------------------- Progress Note Details Patient Name: [name], [name] Date of Service: 08/01/2020 9:15 AM Medical Record Number: [id number] Patient Account Number: [id number] Date of Birth/Sex: 1960/09/09 (60 y.o. M) Treating RN: Dolan Amen Primary Care Provider: Dorisann Frames Other Clinician: Jeanine Luz Referring Provider: Dorisann Frames Treating Provider/Extender: [name] in Treatment: 4 Subjective Chief Complaint Information obtained from Patient Left LE Traumatic Ulcer History of Present Illness (HPI) 07/04/2020 patient presents today for initial evaluation  here in our clinic. He is actually a ward of the state. With that being said he is only been in the group home that he is currently staying in for the past few weeks in fact it was only about 2 weeks. He was seen in the ED for evaluation on 06/18/2020 due to  an injury that he sustained to his left lower leg. He tells me that he got up in the middle the night to go to the bathroom or something and tripped over a wheelchair causing a significant skin tear/laceration to the leg. Unfortunately this was sutured but has dehisced in some areas and appears to have a significant hematoma and others. The proximal portion of the wound has actually necrosis and the skin flap is can have to be removed. The inferior portion appears to still be healthy and has reattached to some degree along the suture line this is still somewhat dehiscing and is can require some sharp debridement. Fortunately there does not appear to be any signs of active infection at this time. With that being said I do believe that we may want to put him back on some preventative antibiotics, Keflex, just to ensure following the debridement today that he is going to be infection free. Released to the best of our ability. The patient does have a history of end-stage renal disease that he is on dialysis. He also has schizophrenia at this point is now a ward of the state as it was deemed that he was not competent to be able to completely care for himself. Apparently his living condition prior to going to the group home was very poor. With that being said he seems to be doing much better now in my opinion and I do feel like that the location is that is a very good facility. The patient is going require some sharp debridement today and we did talk to [name] who is at the [facility] to gain approval for the patient. This was for debridement in particular. We did gain that approval and it was myself in [place] who witnessed  this. Upon evaluation today patient appears to be doing a little better in regard to his wound currently. Fortunately there is no signs of active infection at this time. No fevers, chills, nausea, vomiting, or diarrhea. He has been taking the Keflex without complication. With that being said I am concerned today that he did develop a blood clot under the skin flap that skin to prevent him healing. I think we need to do something to try to prevent this from continuing to be an issue. For that reason I discussed with him the possibility of doing a Unna boot wrap which I think it was going to be the best thing for him as far as healing is concerned he is in agreement with the plan. 07/18/2020. Upon evaluation today patient's wound bed actually showed signs of improvement compared to previous evaluation. I feel like he is granulating in and does seem to be healing quite nicely. Fortunately there is no evidence of active infection which is great news and overall I think that he is headed in the right direction. 07/25/2020 upon evaluation today patient's wound actually appears to show signs of good improvement. I am actually very pleased with where we stand today. He is actually showing some signs of hypergranulation and I think he could benefit from switching to [facility]. That was discussed with the patient to let them know what was going on today as well. 08/01/2020 upon evaluation today patient appears to be doing excellent in regard to his wound on his leg. Fortunately there is no signs of active infection at this time. No fevers, chills, nausea, vomiting, or diarrhea. Objective Constitutional Well-nourished and well-hydrated in no acute distress. Vitals Time Taken: 9:17  AM, Height: 75 in, Weight: 190 lbs, BMI: 23.7, Temperature: 98.5 F, Pulse: 81 bpm, Respiratory Rate: 20 breaths/min, Blood Pressure: 93/50 mmHg. Respiratory normal breathing without difficulty. Psychiatric this patient is  able to make decisions and demonstrates good insight into disease process. Alert and Oriented x 3. pleasant and cooperative. General Notes: Patient's wound bed showed signs of excellent granulation epithelization. There is no signs of infection and overall I feel like he is making excellent progress no sharp debridement is even necessary today and overall measurements are significantly better fortunately down the [name], [name] ([id number]) medial aspect of the wound where there was a skin flap in depth here this appears to have completely healed which is great news. Integumentary (Hair, Skin) Wound #1 status is Open. Original cause of wound was Laceration. The date acquired was: 06/18/2020. The wound has been in treatment 4 weeks. The wound is located on the Left,Anterior Lower Leg. The wound measures 3cm length x 5.4cm width x 0.1cm depth; 12.723cm^2 area and 1.272cm^3 volume. There is Fat Layer (Subcutaneous Tissue) exposed. There is no tunneling or undermining noted. There is a none present amount of drainage noted. The wound margin is flat and intact. There is medium (34-66%) red, hyper - granulation within the wound bed. There is a medium (34-66%) amount of necrotic tissue within the wound bed including Adherent Slough. Assessment Active Problems ICD-10 Unspecified open wound, left lower leg, initial encounter Non-pressure chronic ulcer of other part of left lower leg with fat layer exposed End stage renal disease Dependence on renal dialysis Other schizophrenia Procedures Wound #1 Pre-procedure diagnosis of Wound #1 is a Trauma, Other located on the Left,Anterior Lower Leg . There was a Three Layer Compression Therapy Procedure by Dolan Amen, RN. Post procedure Diagnosis Wound #1: Same as Pre-Procedure Notes: pt tolerating wrap well. Plan Follow-up Appointments: Wound #1 Left,Anterior Lower Leg: Return Appointment in 2 weeks. Home Health: Wound #1 Left,Anterior Lower  Leg: [place]: [facility] ADMIT to [facility] for wound care. May utilize formulary equivalent dressing for wound treatment orders unless otherwise specified. Home Health Nurse may visit PRN to address patient s wound care needs. Scheduled days for dressing changes to be completed; exception, patient has scheduled wound care visit that day. - Dressing changes Tuesday/Thursday in office/Saturday **Please direct any NON-WOUND related issues/requests for orders to patient's Primary Care Physician. **If current dressing causes regression in wound condition, may D/C ordered dressing product/s and apply Normal Saline Moist Dressing daily until next [place] or Other MD appointment. **Notify [facility] of regression in wound condition at [phone]. Anesthetic (Use 'Patient Medications' Section for Anesthetic Order Entry): Wound #1 Left,Anterior Lower Leg: Lidocaine applied to wound bed Benzocaine applied to wound bed. WOUND #1: - Lower Leg Wound Laterality: Left, Anterior Cleanser: Normal Saline 3 x Per Week/30 Days Discharge Instructions: Wash your hands with soap and water. Remove old dressing, discard into plastic bag and place into trash. Cleanse the wound with Normal Saline prior to applying a clean dressing using gauze sponges, not tissues or cotton balls. Do not scrub or use excessive force. Pat dry using gauze sponges, not tissue or cotton balls. Cleanser: Wound Cleanser 3 x Per Week/30 Days Discharge Instructions: Wash your hands with soap and water. Remove old dressing, discard into plastic bag and place into trash. Cleanse the wound with Wound Cleanser prior to applying a clean dressing using gauze sponges, not tissues or cotton balls. Do not scrub or use excessive force.  Pat dry using gauze sponges, not tissue or cotton balls. Primary Dressing: Hydrofera Blue Ready Transfer Foam, 4x5 (in/in) 3 x Per Week/30 Days Discharge Instructions: Apply Hydrofera  Blue Ready to wound bed as directed Secondary Dressing: Xtrasorb Medium 4x5 (in/in) 3 x Per Week/30 Days Discharge Instructions: Apply to wound as directed. Do not cut. Compression Wrap: Profore Lite LF 3 Multilayer Compression Bandaging System 3 x Per Week/30 Days Discharge Instructions: Apply 3 multi-layer wrap as prescribed. Raabe, Nethan L. ([id number]) 1. Would recommend currently that we going to continue with the wound care measures as before the patient is in agreement the plan this includes the use of a continuation of the [facility] dressing which I think is done excellent for him. 2. We will also get a continue with XtraSorb which I feel like has been very good. 3. I am also can recommend that we continue with 3 layer compression wrap that is keeping the edema under good control and I think that he also has been of benefit. We will see patient back for reevaluation in 1 week here in the clinic. If anything worsens or changes patient will contact our office for additional recommendations. Electronic Signature(s) Signed: 08/01/2020 9:50:51 AM By: Worthy Keeler PA-C Entered By: Worthy Keeler on 08/01/2020 09:50:51 Gallicchio, Zae L. ([id number]) -------------------------------------------------------------------------------- SuperBill Details Patient Name: Hoopes, Andyn L. Date of Service: 08/01/2020 Medical Record Number: [id number] Patient Account Number: [id number] Date of Birth/Sex: 1960/06/18 (60 y.o. M) Treating RN: Dolan Amen Primary Care Provider: Dorisann Frames Other Clinician: Jeanine Luz Referring Provider: Dorisann Frames Treating Provider/Extender: [name] in Treatment: 4 Diagnosis Coding ICD-10 Codes Code Description [phone] Unspecified open wound, left lower leg, initial encounter 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 layer exposed N18.6 End stage renal disease Z99.2 Dependence on renal  dialysis F20.89 Other schizophrenia Facility Procedures CPT4 Code: [id number] Description: (Facility Use Only) [phone] - [place] LWR LT LEG Modifier: Quantity: 1 Physician Procedures CPT4 [id number] Description: [id number] - WC PHYS LEVEL 3 - EST PT Modifier: Quantity: 1 CPT4 Code: Description: ICD-10 Diagnosis Description S81.802A Unspecified open wound, left lower leg, initial encounter 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 lay N18.6 End stage renal disease Z99.2 Dependence on renal dialysis Modifier: er exposed Quantity: Electronic Signature(s) Signed: 08/01/2020 9:51:03 AM By: Worthy Keeler PA-C Entered By: Worthy Keeler on 08/01/2020 09:51:03

## 2020-08-01 NOTE — Progress Notes (Signed)
Koehler, Nike L. ([id number]) Visit Report for 08/01/2020 Fall Risk Assessment Details Patient Name: [name], [name] Date of Service: 08/01/2020 9:15 AM Medical Record Number: [id number] Patient Account Number: [id number] Date of Birth/Sex: 02-27-1961 (60 y.o. M) Treating RN: Dolan Amen Primary Care Atalya Dano: Dorisann Frames Other Clinician: Jeanine Luz Referring Monaye Blackie: Dorisann Frames Treating Dauna Ziska/Extender: [name] in Treatment: 4 Fall Risk Assessment Items Have you had 2 or more falls in the last 12 monthso 0 No Have you had any fall that resulted in injury in the last 12 monthso 0 No FALLS RISK SCREEN History of falling - immediate or within 3 months 25 Yes Secondary diagnosis (Do you have 2 or more medical diagnoseso) 0 No Ambulatory aid None/bed rest/wheelchair/nurse 0 Yes Crutches/cane/walker 0 No Furniture 0 No Intravenous therapy Access/Saline/Heparin Lock 0 No Gait/Transferring Normal/ bed rest/ wheelchair 0 Yes Weak (short steps with or without shuffle, stooped but able to lift head while walking, may 0 No seek support from furniture) Impaired (short steps with shuffle, may have difficulty arising from chair, head down, impaired 0 No balance) Mental Status Oriented to own ability 0 No Electronic Signature(s) Signed: 08/01/2020 9:48:19 AM By: Jeanine Luz Signed: 08/01/2020 2:54:27 PM By: Georges Mouse, Minus Breeding RN Entered By: Jeanine Luz on 08/01/2020 09:48:18

## 2020-08-02 NOTE — Progress Notes (Signed)
Isabell, Yonah L. ([id number]) Visit Report for 08/01/2020 Arrival Information Details Patient Name: WOODIN, Samanyu L. Date of Service: 08/01/2020 9:15 AM Medical Record Number: [id number] Patient Account Number: [id number] Date of Birth/Sex: Oct 11, 1960 (60 y.o. M) Treating RN: Dolan Amen Primary Care Doretha Goding: Dorisann Frames Other Clinician: Jeanine Luz Referring Anakin Varkey: Dorisann Frames Treating Zaiden Ludlum/Extender: [name] in Treatment: 4 Visit Information History Since Last Visit Added or deleted any medications: No Patient Arrived: Wheel Chair Had a fall or experienced change in Yes Arrival Time: 09:14 activities of daily living that may affect Accompanied By: caregiver risk of falls: Transfer Assistance: None Hospitalized since last visit: No Patient Identification Verified: Yes Pain Present Now: Yes Secondary Verification Process Completed: Yes Patient Requires Transmission-Based Precautions: No Patient Has Alerts: Yes Patient Alerts: 07/25/2020 ABI left [place]>220 Electronic Signature(s) Signed: 08/01/2020 1:24:27 PM By: Jeanine Luz Entered By: Jeanine Luz on 08/01/2020 09:29:36 Nazzaro, Hawkin L. ([id number]) -------------------------------------------------------------------------------- Clinic Level of Care Assessment Details Patient Name: Aughenbaugh, Ebin L. Date of Service: 08/01/2020 9:15 AM Medical Record Number: [id number] Patient Account Number: [id number] Date of Birth/Sex: May 16, 1961 (60 y.o. M) Treating RN: Dolan Amen Primary Care Serenitie Vinton: Dorisann Frames Other Clinician: Jeanine Luz Referring Dameon Soltis: Dorisann Frames Treating Yeriel Mineo/Extender: [name] in Treatment: 4 Clinic Level of Care Assessment Items TOOL 1 Quantity Score []  - Use when EandM and Procedure is performed on INITIAL visit 0 ASSESSMENTS - Nursing Assessment / Reassessment []  - General Physical Exam (combine w/ comprehensive assessment (listed just  below) when performed on new 0 pt. evals) []  - 0 Comprehensive Assessment (HX, ROS, Risk Assessments, Wounds Hx, etc.) ASSESSMENTS - Wound and Skin Assessment / Reassessment []  - Dermatologic / Skin Assessment (not related to wound area) 0 ASSESSMENTS - Ostomy and/or Continence Assessment and Care []  - Incontinence Assessment and Management 0 []  - 0 Ostomy Care Assessment and Management (repouching, etc.) PROCESS - Coordination of Care []  - Simple Patient / Family Education for ongoing care 0 []  - 0 Complex (extensive) Patient / Family Education for ongoing care []  - 0 Staff obtains Programmer, systems, Records, Test Results / Process Orders []  - 0 Staff telephones HHA, Nursing Homes / Clarify orders / etc []  - 0 Routine Transfer to another Facility (non-emergent condition) []  - 0 Routine Hospital Admission (non-emergent condition) []  - 0 New Admissions / Biomedical engineer / Ordering NPWT, Apligraf, etc. []  - 0 Emergency Hospital Admission (emergent condition) PROCESS - Special Needs []  - Pediatric / Minor Patient Management 0 []  - 0 Isolation Patient Management []  - 0 Hearing / Language / Visual special needs []  - 0 Assessment of Community assistance (transportation, D/C planning, etc.) []  - 0 Additional assistance / Altered mentation []  - 0 Support Surface(s) Assessment (bed, cushion, seat, etc.) INTERVENTIONS - Miscellaneous []  - External ear exam 0 []  - 0 Patient Transfer (multiple staff / Civil Service fast streamer / Similar devices) []  - 0 Simple Staple / Suture removal (25 or less) []  - 0 Complex Staple / Suture removal (26 or more) []  - 0 Hypo/Hyperglycemic Management (do not check if billed separately) []  - 0 Ankle / Brachial Index (ABI) - do not check if billed separately Has the patient been seen at the hospital within the last three years: Yes Total Score: 0 Level Of Care: ____ Slone, Jhan L. ([id number]) Electronic Signature(s) Signed: 08/01/2020 2:54:27 PM By:  Georges Mouse, Minus Breeding RN Entered By: Georges Mouse, Minus Breeding on 08/01/2020 09:49:46 Frane, Jarron L. ([id number]) -------------------------------------------------------------------------------- Compression Therapy Details Patient Name: Fawcett, Ryley L. Date  of Service: 08/01/2020 9:15 AM Medical Record Number: [id number] Patient Account Number: [id number] Date of Birth/Sex: November 19, 1960 (60 y.o. M) Treating RN: Dolan Amen Primary Care Jayceon Troy: Dorisann Frames Other Clinician: Jeanine Luz Referring Maitri Schnoebelen: Dorisann Frames Treating Corinthian Mizrahi/Extender: [name] in Treatment: 4 Compression Therapy Performed for Wound Assessment: Wound #1 Left,Anterior Lower Leg Performed By: Clinician Dolan Amen, RN Compression Type: Three Layer Post Procedure Diagnosis Same as Pre-procedure Notes pt tolerating wrap well Electronic Signature(s) Signed: 08/01/2020 2:54:27 PM By: Georges Mouse, Minus Breeding RN Entered By: Georges Mouse, Kenia on 08/01/2020 09:49:27 Rowen, Kebron L. ([id number]) -------------------------------------------------------------------------------- Encounter Discharge Information Details Patient Name: Gangemi, Ayad L. Date of Service: 08/01/2020 9:15 AM Medical Record Number: [id number] Patient Account Number: [id number] Date of Birth/Sex: 27-Nov-1960 (60 y.o. M) Treating RN: Dolan Amen Primary Care Izekiel Flegel: Dorisann Frames Other Clinician: Jeanine Luz Referring Obie Silos: Dorisann Frames Treating Sophiarose Eades/Extender: [name] in Treatment: 4 Encounter Discharge Information Items Discharge Condition: Stable Ambulatory Status: Wheelchair Discharge Destination: Other (Note Required) Orders Sent: Yes Transportation: Private Auto Accompanied By: caregiver Schedule Follow-up Appointment: Yes Clinical Summary of Care: Patient Declined Electronic Signature(s) Signed: 08/02/2020 8:47:47 AM By: Carlene Coria RN Entered By: Carlene Coria on  08/01/2020 10:06:00 Lopezperez, Martha L. ([id number]) -------------------------------------------------------------------------------- Lower Extremity Assessment Details Patient Name: Pawlicki, Kebin L. Date of Service: 08/01/2020 9:15 AM Medical Record Number: [id number] Patient Account Number: [id number] Date of Birth/Sex: [birth year] (60 y.o. M) Treating RN: Dolan Amen Primary Care Giannamarie Paulus: Dorisann Frames Other Clinician: Jeanine Luz Referring Kerstin Crusoe: Dorisann Frames Treating Ashland Osmer/Extender: [name] in Treatment: 4 Edema Assessment Assessed: Shirlyn Goltz: Yes] Patrice Paradise: No] Edema: [Left: N] [Right: o] Calf Left: Right: Point of Measurement: 39 cm From Medial Instep 31.5 cm Ankle Left: Right: Point of Measurement: 10 cm From Medial Instep 21.1 cm Vascular Assessment Pulses: Dorsalis Pedis Palpable: [Left:Yes] Electronic Signature(s) Signed: 08/01/2020 9:46:18 AM By: Jeanine Luz Signed: 08/01/2020 2:54:27 PM By: Georges Mouse, Minus Breeding RN Entered By: Jeanine Luz on 08/01/2020 09:46:18 Ludington, Waylin L. ([id number]) -------------------------------------------------------------------------------- Multi Wound Chart Details Patient Name: Masella, Jahquez L. Date of Service: 08/01/2020 9:15 AM Medical Record Number: [id number] Patient Account Number: [id number] Date of Birth/Sex: Aug 26, 1960 (60 y.o. M) Treating RN: Dolan Amen Primary Care Pearley Baranek: Dorisann Frames Other Clinician: Jeanine Luz Referring Doniesha Landau: Dorisann Frames Treating Lonita Debes/Extender: [name] in Treatment: 4 Vital Signs Height(in): 75 Pulse(bpm): 62 Weight(lbs): 190 Blood Pressure(mmHg): 62/50 Body Mass Index(BMI): 24 Temperature(F): 98.5 Respiratory Rate(breaths/min): 20 Photos: [N/A:N/A] Wound Location: Left, Anterior Lower Leg N/A N/A Wounding Event: Laceration N/A N/A Primary Etiology: Trauma, Other N/A N/A Comorbid History: Chronic Obstructive Pulmonary N/A  N/A Disease (COPD), Hypertension Date Acquired: 06/18/2020 N/A N/A Weeks of Treatment: 4 N/A N/A Wound Status: Open N/A N/A Measurements L x W x D (cm) 3x5.4x0.1 N/A N/A Area (cm) : 12.723 N/A N/A Volume (cm) : 1.272 N/A N/A % Reduction in Area: 76.40% N/A N/A % Reduction in Volume: 94.10% N/A N/A Classification: Full Thickness Without Exposed N/A N/A Support Structures Exudate Amount: None Present N/A N/A Wound Margin: Flat and Intact N/A N/A Granulation Amount: Medium (34-66%) N/A N/A Granulation Quality: Red, Hyper-granulation N/A N/A Necrotic Amount: Medium (34-66%) N/A N/A Exposed Structures: Fat Layer (Subcutaneous Tissue): N/A N/A Yes Fascia: No Tendon: No Muscle: No Joint: No Bone: No Epithelialization: None N/A N/A Treatment Notes Electronic Signature(s) Signed: 08/01/2020 2:54:27 PM By: Georges Mouse, Minus Breeding RN Entered By: Georges Mouse, Minus Breeding on 08/01/2020 09:47:43 Osterloh, Festus L. ([id number]) -------------------------------------------------------------------------------- Multi-Disciplinary Care Plan Details Patient Name: Sanluis, Julian L.  Date of Service: 08/01/2020 9:15 AM Medical Record Number: [id number] Patient Account Number: [id number] Date of Birth/Sex: [birth year] (60 y.o. M) Treating RN: Dolan Amen Primary Care Celester Morgan: Dorisann Frames Other Clinician: Jeanine Luz Referring France Noyce: Dorisann Frames Treating Larue Drawdy/Extender: [name] in Treatment: 4 Active Inactive Wound/Skin Impairment Nursing Diagnoses: Impaired tissue integrity Goals: Patient/caregiver will verbalize understanding of skin care regimen Date Initiated: 07/04/2020 Date Inactivated: 07/18/2020 Target Resolution Date: 07/04/2020 Goal Status: Met Ulcer/skin breakdown will have a volume reduction of 30% by week 4 Date Initiated: 07/04/2020 Target Resolution Date: 08/01/2020 Goal Status: Active Ulcer/skin breakdown will have a volume reduction of 50% by week  8 Date Initiated: 07/04/2020 Target Resolution Date: 09/01/2020 Goal Status: Active Interventions: Assess patient/caregiver ability to obtain necessary supplies Assess patient/caregiver ability to perform ulcer/skin care regimen upon admission and as needed Assess ulceration(s) every visit Treatment Activities: Skin care regimen initiated : 07/04/2020 Topical wound management initiated : 07/04/2020 Notes: Electronic Signature(s) Signed: 08/01/2020 2:54:27 PM By: Georges Mouse, Minus Breeding RN Entered By: Georges Mouse, Minus Breeding on 08/01/2020 09:47:31 Rambert, Marty L. ([id number]) -------------------------------------------------------------------------------- Pain Assessment Details Patient Name: Monday, Laquan L. Date of Service: 08/01/2020 9:15 AM Medical Record Number: [id number] Patient Account Number: [id number] Date of Birth/Sex: 1960/11/29 (60 y.o. M) Treating RN: Dolan Amen Primary Care Omare Bilotta: Dorisann Frames Other Clinician: Jeanine Luz Referring Estha Few: Dorisann Frames Treating Izzac Rockett/Extender: [name] in Treatment: 4 Active Problems Location of Pain Severity and Description of Pain Patient Has Paino Yes Site Locations Rate the pain. Current Pain Level: 9 Pain Management and Medication Current Pain Management: Electronic Signature(s) Signed: 08/01/2020 1:24:27 PM By: Jeanine Luz Signed: 08/01/2020 2:54:27 PM By: Georges Mouse, Minus Breeding RN Entered By: Jeanine Luz on 08/01/2020 09:29:44 Lax, Hunner L. ([id number]) -------------------------------------------------------------------------------- Patient/Caregiver Education Details Patient Name: Desilva, Nickey L. Date of Service: 08/01/2020 9:15 AM Medical Record Number: [id number] Patient Account Number: [id number] Date of Birth/Gender: 28-Sep-1960 (60 y.o. M) Treating RN: Dolan Amen Primary Care Physician: Dorisann Frames Other Clinician: Jeanine Luz Referring Physician: Dorisann Frames Treating Physician/Extender: [name] in Treatment: 4 Education Assessment Education Provided To: Caregiver Education Topics Provided Wound/Skin Impairment: Methods: Explain/Verbal Responses: State content correctly [employer]) Signed: 08/01/2020 2:54:27 PM By: Georges Mouse, Minus Breeding RN Entered By: Georges Mouse, Minus Breeding on 08/01/2020 09:50:17 Hauck, Lorenzo L. ([id number]) -------------------------------------------------------------------------------- Wound Assessment Details Patient Name: Deford, Eulas L. Date of Service: 08/01/2020 9:15 AM Medical Record Number: [id number] Patient Account Number: [id number] Date of Birth/Sex: [birth year] (60 y.o. M) Treating RN: Dolan Amen Primary Care Shady Bradish: Dorisann Frames Other Clinician: Jeanine Luz Referring Ancelmo Hunt: Dorisann Frames Treating Margarine Grosshans/Extender: [name] in Treatment: 4 Wound Status Wound Number: 1 Primary Trauma, Other Etiology: Wound Location: Left, Anterior Lower Leg Wound Status: Open Wounding Event: Laceration Comorbid Chronic Obstructive Pulmonary Disease (COPD), Date Acquired: 06/18/2020 History: Hypertension Weeks Of Treatment: 4 Clustered Wound: No Photos Wound Measurements Length: (cm) 3 Width: (cm) 5.4 Depth: (cm) 0.1 Area: (cm) 12.723 Volume: (cm) 1.272 % Reduction in Area: 76.4% % Reduction in Volume: 94.1% Epithelialization: None Tunneling: No Undermining: No Wound Description Classification: Full Thickness Without Exposed Support Structures Wound Margin: Flat and Intact Exudate Amount: None Present Foul Odor After Cleansing: No Slough/Fibrino Yes Wound Bed Granulation Amount: Medium (34-66%) Exposed Structure Granulation Quality: Red, Hyper-granulation Fascia Exposed: No Necrotic Amount: Medium (34-66%) Fat Layer (Subcutaneous Tissue) Exposed: Yes Necrotic Quality: Adherent Slough Tendon Exposed: No Muscle Exposed: No Joint Exposed:  No Bone Exposed: No Treatment Notes Wound #1 (Lower Leg) Wound Laterality: Left, Anterior  Cleanser Normal Saline Discharge Instruction: Wash your hands with soap and water. Remove old dressing, discard into plastic bag and place into trash. Cleanse the wound with Normal Saline prior to applying a clean dressing using gauze sponges, not tissues or cotton balls. Do not scrub or use excessive force. Pat dry using gauze sponges, not tissue or cotton balls. Wound Cleanser Ohmann, Jaymarion L. ([id number]) Discharge Instruction: Wash your hands with soap and water. Remove old dressing, discard into plastic bag and place into trash. Cleanse the wound with Wound Cleanser prior to applying a clean dressing using gauze sponges, not tissues or cotton balls. Do not scrub or use excessive force. Pat dry using gauze sponges, not tissue or cotton balls. Peri-Wound Care Topical Primary Dressing Hydrofera Blue Ready Transfer Foam, 4x5 (in/in) Discharge Instruction: Apply Hydrofera Blue Ready to wound bed as directed Secondary Dressing Xtrasorb Medium 4x5 (in/in) Discharge Instruction: Apply to wound as directed. Do not cut. Secured With Compression Wrap Profore Lite LF 3 Multilayer Compression Bandaging System Discharge Instruction: Apply 3 multi-layer wrap as prescribed. Compression Stockings Add-Ons Electronic Signature(s) Signed: 08/01/2020 1:24:27 PM By: Jeanine Luz Signed: 08/01/2020 2:54:27 PM By: Georges Mouse, Minus Breeding RN Entered By: Jeanine Luz on 08/01/2020 09:42:48 Scully, Wessley L. ([id number]) -------------------------------------------------------------------------------- Vitals Details Patient Name: Canal, Jessejames L. Date of Service: 08/01/2020 9:15 AM Medical Record Number: [id number] Patient Account Number: [id number] Date of Birth/Sex: June 15, 1960 (60 y.o. M) Treating RN: Dolan Amen Primary Care Cherith Tewell: Dorisann Frames Other Clinician: Jeanine Luz Referring Katheline Brendlinger:  Dorisann Frames Treating Jawad Wiacek/Extender: [name] in Treatment: 4 Vital Signs Time Taken: 09:17 Temperature (F): 98.5 Height (in): 75 Pulse (bpm): 81 Weight (lbs): 190 Respiratory Rate (breaths/min): 20 Body Mass Index (BMI): 23.7 Blood Pressure (mmHg): 93/50 Reference Range: 80 - 120 mg / dl Electronic Signature(s) Signed: 08/01/2020 1:24:27 PM By: Jeanine Luz Entered By: Jeanine Luz on 08/01/2020 09:29:40

## 2020-08-15 ENCOUNTER — Other Ambulatory Visit: Payer: Self-pay

## 2020-08-15 ENCOUNTER — Encounter: Payer: Medicaid Other | Admitting: Physician Assistant

## 2020-08-15 DIAGNOSIS — L97822 Non-pressure chronic ulcer of other part of left lower leg with fat layer exposed: Secondary | ICD-10-CM | POA: Diagnosis not present

## 2020-08-15 NOTE — Progress Notes (Addendum)
Seda, Trayshawn L. ([id number]) Visit Report for 08/15/2020 Chief Complaint Document Details Patient Name: Todd Brown, [name] Date of Service: 08/15/2020 9:15 AM Medical Record Number: [id number] Patient Account Number: [id number] Date of Birth/Sex: May 30, 1961 (60 y.o. M) Treating RN: Dolan Amen Primary Care Provider: Dorisann Frames Other Clinician: Jeanine Luz Referring Provider: Dorisann Frames Treating Provider/Extender: [name] in Treatment: 6 Information Obtained from: Patient Chief Complaint Left LE Traumatic Ulcer Electronic Signature(s) Signed: 08/15/2020 9:40:36 AM By: Worthy Keeler PA-C Entered By: Worthy Keeler on 08/15/2020 09:40:36 Todd Brown, [name] ([id number]) -------------------------------------------------------------------------------- HPI Details Patient Name: [name], [name] Date of Service: 08/15/2020 9:15 AM Medical Record Number: [id number] Patient Account Number: [id number] Date of Birth/Sex: 1961/03/19 (60 y.o. M) Treating RN: Dolan Amen Primary Care Provider: Dorisann Frames Other Clinician: Jeanine Luz Referring Provider: Dorisann Frames Treating Provider/Extender: [name] in Treatment: 6 History of Present Illness HPI Description: 07/04/2020 patient presents today for initial evaluation here in our clinic. He is actually a ward of the state. With that being said he is only been in the group home that he is currently staying in for the past few weeks in fact it was only about 2 weeks. He was seen in the ED for evaluation on 06/18/2020 due to an injury that he sustained to his left lower leg. He tells me that he got up in the middle the night to go to the bathroom or something and tripped over a wheelchair causing a significant skin tear/laceration to the leg. Unfortunately this was sutured but has dehisced in some areas and appears to have a significant hematoma and others. The proximal portion of the wound has  actually necrosis and the skin flap is can have to be removed. The inferior portion appears to still be healthy and has reattached to some degree along the suture line this is still somewhat dehiscing and is can require some sharp debridement. Fortunately there does not appear to be any signs of active infection at this time. With that being said I do believe that we may want to put him back on some preventative antibiotics, Keflex, just to ensure following the debridement today that he is going to be infection free. Released to the best of our ability. The patient does have a history of end-stage renal disease that he is on dialysis. He also has schizophrenia at this point is now a ward of the state as it was deemed that he was not competent to be able to completely care for himself. Apparently his living condition prior to going to the group home was very poor. With that being said he seems to be doing much better now in my opinion and I do feel like that the location is that is a very good facility. The patient is going require some sharp debridement today and we did talk to [name] who is at the [facility] to gain approval for the patient. This was for debridement in particular. We did gain that approval and it was myself in [place] who witnessed this. Upon evaluation today patient appears to be doing a little better in regard to his wound currently. Fortunately there is no signs of active infection at this time. No fevers, chills, nausea, vomiting, or diarrhea. He has been taking the Keflex without complication. With that being said I am concerned today that he did develop a blood clot under the skin flap that skin to prevent him healing. I think we need to  do something to try to prevent this from continuing to be an issue. For that reason I discussed with him the possibility of doing a Unna boot wrap which I think it was going to be the best thing for him as far as  healing is concerned he is in agreement with the plan. 07/18/2020. Upon evaluation today patient's wound bed actually showed signs of improvement compared to previous evaluation. I feel like he is granulating in and does seem to be healing quite nicely. Fortunately there is no evidence of active infection which is great news and overall I think that he is headed in the right direction. 07/25/2020 upon evaluation today patient's wound actually appears to show signs of good improvement. I am actually very pleased with where we stand today. He is actually showing some signs of hypergranulation and I think he could benefit from switching to [facility]. That was discussed with the patient to let them know what was going on today as well. 08/01/2020 upon evaluation today patient appears to be doing excellent in regard to his wound on his leg. Fortunately there is no signs of active infection at this time. No fevers, chills, nausea, vomiting, or diarrhea. 08/15/2020 on evaluation today patient appears to be doing well with regard to his leg ulcer. This is showing signs of excellent improvement overall I am extremely pleased with where we stand he is definitely significantly smaller. Electronic Signature(s) Signed: 08/15/2020 9:58:55 AM By: Worthy Keeler PA-C Entered By: Worthy Keeler on 08/15/2020 09:58:55 Todd Brown, [name] ([id number]) -------------------------------------------------------------------------------- Physical Exam Details Patient Name: Todd Brown, [name] Date of Service: 08/15/2020 9:15 AM Medical Record Number: [id number] Patient Account Number: [id number] Date of Birth/Sex: 1961/01/07 (60 y.o. M) Treating RN: Dolan Amen Primary Care Provider: Dorisann Frames Other Clinician: Jeanine Luz Referring Provider: Dorisann Frames Treating Provider/Extender: [name] in Treatment: 6 Constitutional Well-nourished and well-hydrated in no acute  distress. Respiratory normal breathing without difficulty. Psychiatric this patient is able to make decisions and demonstrates good insight into disease process. Alert and Oriented x 3. pleasant and cooperative. Notes Patient's wound bed showed signs of good granulation epithelization at this point. There does not appear to be any evidence of active infection which is great news and overall very pleased with where things stand today. No fevers, chills, nausea, vomiting, or diarrhea. Electronic Signature(s) Signed: 08/15/2020 9:59:09 AM By: Worthy Keeler PA-C Entered By: Worthy Keeler on 08/15/2020 09:59:09 Todd Brown, [name] ([id number]) -------------------------------------------------------------------------------- Physician Orders Details Patient Name: Todd Brown, [name] Date of Service: 08/15/2020 9:15 AM Medical Record Number: [id number] Patient Account Number: [id number] Date of Birth/Sex: [birth year] (60 y.o. M) Treating RN: Dolan Amen Primary Care Provider: Dorisann Frames Other Clinician: Jeanine Luz Referring Provider: Dorisann Frames Treating Provider/Extender: [name] in Treatment: 6 Verbal / Phone Orders: No Diagnosis Coding ICD-10 Coding Code Description [phone] Unspecified open wound, left lower leg, initial encounter 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 layer exposed N18.6 End stage renal disease Z99.2 Dependence on renal dialysis F20.89 Other schizophrenia Follow-up Appointments o Return Appointment in 1 week. [place]: [facility] o ADMIT to Home Health for wound care. May utilize formulary equivalent dressing for wound treatment orders unless otherwise specified. Home Health Nurse may visit PRN to address patientos wound care needs. o Scheduled days for dressing changes to be completed; exception, patient has scheduled wound care visit that day. - Dressing changes  Tuesday/Thursday in  office/Saturday o **Please direct any NON-WOUND related issues/requests for orders to patient's Primary Care Physician. **If current dressing causes regression in wound condition, may D/C ordered dressing product/s and apply Normal Saline Moist Dressing daily until next [place] or Other MD appointment. **Notify Wound Healing Center of regression in wound condition at [phone]. Anesthetic (Use 'Patient Medications' Section for Anesthetic Order Entry) o Lidocaine applied to wound bed o Benzocaine applied to wound bed. Wound Treatment Wound #1 - Lower Leg Wound Laterality: Left, Anterior Cleanser: Normal Saline 3 x Per Week/30 Days Discharge Instructions: Wash your hands with soap and water. Remove old dressing, discard into plastic bag and place into trash. Cleanse the wound with Normal Saline prior to applying a clean dressing using gauze sponges, not tissues or cotton balls. Do not scrub or use excessive force. Pat dry using gauze sponges, not tissue or cotton balls. Cleanser: Wound Cleanser 3 x Per Week/30 Days Discharge Instructions: Wash your hands with soap and water. Remove old dressing, discard into plastic bag and place into trash. Cleanse the wound with Wound Cleanser prior to applying a clean dressing using gauze sponges, not tissues or cotton balls. Do not scrub or use excessive force. Pat dry using gauze sponges, not tissue or cotton balls. Primary Dressing: Hydrofera Blue Ready Transfer Foam, 4x5 (in/in) 3 x Per Week/30 Days Discharge Instructions: Apply Hydrofera Blue Ready to wound bed as directed Secondary Dressing: Xtrasorb Medium 4x5 (in/in) 3 x Per Week/30 Days Discharge Instructions: Apply to wound as directed. Do not cut. Compression Wrap: Profore Lite LF 3 Multilayer Compression Bandaging System 3 x Per Week/30 Days Discharge Instructions: Apply 3 multi-layer wrap as prescribed. Electronic Signature(s) Signed: 08/15/2020 3:42:51  PM By: Worthy Keeler PA-C Signed: 08/15/2020 4:04:06 PM By: Georges Mouse, Minus Breeding RN Entered By: Georges Mouse, Minus Breeding on 08/15/2020 09:57:59 Todd Brown, [name] ([id number]) Todd Brown, [name] ([id number]) -------------------------------------------------------------------------------- Problem List Details Patient Name: Todd Brown, [name] Date of Service: 08/15/2020 9:15 AM Medical Record Number: [id number] Patient Account Number: [id number] Date of Birth/Sex: Mar 20, 1961 (60 y.o. M) Treating RN: Dolan Amen Primary Care Provider: Dorisann Frames Other Clinician: Jeanine Luz Referring Provider: Dorisann Frames Treating Provider/Extender: [name] in Treatment: 6 Active Problems ICD-10 Encounter Code Description Active Date MDM Diagnosis [phone] Unspecified open wound, left lower leg, initial encounter 07/04/2020 No Yes 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 layer 07/04/2020 No Yes exposed N18.6 End stage renal disease 07/04/2020 No Yes Z99.2 Dependence on renal dialysis 07/04/2020 No Yes F20.89 Other schizophrenia 07/04/2020 No Yes Inactive Problems Resolved Problems Electronic Signature(s) Signed: 08/15/2020 9:40:30 AM By: Worthy Keeler PA-C Entered By: Worthy Keeler on 08/15/2020 09:40:30 Todd Brown, [name] ([id number]) -------------------------------------------------------------------------------- Progress Note Details Patient Name: Todd Brown, [name] Date of Service: 08/15/2020 9:15 AM Medical Record Number: [id number] Patient Account Number: [id number] Date of Birth/Sex: [birth year] (60 y.o. M) Treating RN: Dolan Amen Primary Care Provider: Dorisann Frames Other Clinician: Jeanine Luz Referring Provider: Dorisann Frames Treating Provider/Extender: [name] in Treatment: 6 Subjective Chief Complaint Information obtained from Patient Left LE Traumatic Ulcer History of Present Illness (HPI) 07/04/2020  patient presents today for initial evaluation here in our clinic. He is actually a ward of the state. With that being said he is only been in the group home that he is currently staying in for the past few weeks in fact it was only about 2 weeks. He was seen in the ED for evaluation on 06/18/2020 due  to an injury that he sustained to his left lower leg. He tells me that he got up in the middle the night to go to the bathroom or something and tripped over a wheelchair causing a significant skin tear/laceration to the leg. Unfortunately this was sutured but has dehisced in some areas and appears to have a significant hematoma and others. The proximal portion of the wound has actually necrosis and the skin flap is can have to be removed. The inferior portion appears to still be healthy and has reattached to some degree along the suture line this is still somewhat dehiscing and is can require some sharp debridement. Fortunately there does not appear to be any signs of active infection at this time. With that being said I do believe that we may want to put him back on some preventative antibiotics, Keflex, just to ensure following the debridement today that he is going to be infection free. Released to the best of our ability. The patient does have a history of end-stage renal disease that he is on dialysis. He also has schizophrenia at this point is now a ward of the state as it was deemed that he was not competent to be able to completely care for himself. Apparently his living condition prior to going to the group home was very poor. With that being said he seems to be doing much better now in my opinion and I do feel like that the location is that is a very good facility. The patient is going require some sharp debridement today and we did talk to [name] to gain [facility] for the patient. This was for debridement in particular. We did gain that approval  and it was myself in [place] who witnessed this. Upon evaluation today patient appears to be doing a little better in regard to his wound currently. Fortunately there is no signs of active infection at this time. No fevers, chills, nausea, vomiting, or diarrhea. He has been taking the Keflex without complication. With that being said I am concerned today that he did develop a blood clot under the skin flap that skin to prevent him healing. I think we need to do something to try to prevent this from continuing to be an issue. For that reason I discussed with him the possibility of doing a Unna boot wrap which I think it was going to be the best thing for him as far as healing is concerned he is in agreement with the plan. 07/18/2020. Upon evaluation today patient's wound bed actually showed signs of improvement compared to previous evaluation. I feel like he is granulating in and does seem to be healing quite nicely. Fortunately there is no evidence of active infection which is great news and overall I think that he is headed in the right direction. 07/25/2020 upon evaluation today patient's wound actually appears to show signs of good improvement. I am actually very pleased with where we stand today. He is actually showing some signs of hypergranulation and I think he could benefit from switching to [facility]. That was discussed with the patient to let them know what was going on today as well. 08/01/2020 upon evaluation today patient appears to be doing excellent in regard to his wound on his leg. Fortunately there is no signs of active infection at this time. No fevers, chills, nausea, vomiting, or diarrhea. 08/15/2020 on evaluation today patient appears to be doing well with regard  to his leg ulcer. This is showing signs of excellent improvement overall I am extremely pleased with where we stand he is definitely significantly smaller. Objective Constitutional Well-nourished and well-hydrated in no  acute distress. Vitals Time Taken: 9:28 AM, Height: 75 in, Weight: 190 lbs, BMI: 23.7, Temperature: 98.4 F, Pulse: 70 bpm, Respiratory Rate: 18 breaths/min, Blood Pressure: 136/76 mmHg. Respiratory normal breathing without difficulty. Psychiatric this patient is able to make decisions and demonstrates good insight into disease process. Alert and Oriented x 3. pleasant and cooperative. Todd Brown, [name] ([id number]) General Notes: Patient's wound bed showed signs of good granulation epithelization at this point. There does not appear to be any evidence of active infection which is great news and overall very pleased with where things stand today. No fevers, chills, nausea, vomiting, or diarrhea. Integumentary (Hair, Skin) Wound #1 status is Open. Original cause of wound was Laceration. The date acquired was: 06/18/2020. The wound has been in treatment 6 weeks. The wound is located on the Left,Anterior Lower Leg. The wound measures 2.3cm length x 3.1cm width x 0.1cm depth; 5.6cm^2 area and 0.56cm^3 volume. There is Fat Layer (Subcutaneous Tissue) exposed. There is no tunneling or undermining noted. There is a medium amount of serous drainage noted. The wound margin is flat and intact. There is large (67-100%) red, friable granulation within the wound bed. There is a small (1-33%) amount of necrotic tissue within the wound bed including Adherent Slough. Assessment Active Problems ICD-10 Unspecified open wound, left lower leg, initial encounter Non-pressure chronic ulcer of other part of left lower leg with fat layer exposed End stage renal disease Dependence on renal dialysis Other schizophrenia Procedures Wound #1 Pre-procedure diagnosis of Wound #1 is a Trauma, Other located on the Left,Anterior Lower Leg . There was a Three Layer Compression Therapy Procedure by Dolan Amen, RN. Post procedure Diagnosis Wound #1: Same as Pre-Procedure Notes: pt tolerating wrap well. Plan Follow-up  Appointments: Return Appointment in 1 week. Home Health: [place]: [name] care. May utilize formulary equivalent dressing for wound treatment orders unless otherwise specified. Home Health Nurse may visit PRN to address patient s wound care needs. Scheduled days for dressing changes to be completed; exception, patient has scheduled wound care visit that day. - Dressing changes Tuesday/Thursday in office/Saturday **Please direct any NON-WOUND related issues/requests for orders to patient's Primary Care Physician. **If current dressing causes regression in wound condition, may D/C ordered dressing product/s and apply Normal Saline Moist Dressing daily until next [place] or Other MD appointment. **Notify Wound Healing Center of regression in wound condition at [phone]. Anesthetic (Use 'Patient Medications' Section for Anesthetic Order Entry): Lidocaine applied to wound bed Benzocaine applied to wound bed. WOUND #1: - Lower Leg Wound Laterality: Left, Anterior Cleanser: Normal Saline 3 x Per Week/30 Days Discharge Instructions: Wash your hands with soap and water. Remove old dressing, discard into plastic bag and place into trash. Cleanse the wound with Normal Saline prior to applying a clean dressing using gauze sponges, not tissues or cotton balls. Do not scrub or use excessive force. Pat dry using gauze sponges, not tissue or cotton balls. Cleanser: Wound Cleanser 3 x Per Week/30 Days Discharge Instructions: Wash your hands with soap and water. Remove old dressing, discard into plastic bag and place into trash. Cleanse the wound with Wound Cleanser prior to applying a clean dressing using gauze sponges, not tissues or cotton balls. Do not scrub or use excessive force.  Pat dry using gauze sponges, not tissue or cotton balls. Primary Dressing: Hydrofera Blue Ready Transfer Foam, 4x5 (in/in) 3 x Per Week/30 Days Discharge  Instructions: Apply Hydrofera Blue Ready to wound bed as directed Secondary Dressing: Xtrasorb Medium 4x5 (in/in) 3 x Per Week/30 Days Discharge Instructions: Apply to wound as directed. Do not cut. Compression Wrap: Profore Lite LF 3 Multilayer Compression Bandaging System 3 x Per Week/30 Days Discharge Instructions: Apply 3 multi-layer wrap as prescribed. Todd Brown, [name] ([id number]) 1. Would recommend that we continue to utilize the [facility] I think this is doing a great job and will continue as such. 2. I am also can recommend that we continue to utilize the compression wrap. We have been using a 3 layer compression wrap which I think is doing a great job. 3. I am also going to suggest that he go ahead and continue as well and elevate his leg to help keep edema under good control. We will see patient back for reevaluation in 1 week here in the clinic. If anything worsens or changes patient will contact our office for additional recommendations. Electronic Signature(s) Signed: 08/15/2020 9:59:36 AM By: Worthy Keeler PA-C Entered By: Worthy Keeler on 08/15/2020 09:59:36 [place], Catlin L. ([id number]) -------------------------------------------------------------------------------- SuperBill Details Patient Name: Todd Brown, [name] Date of Service: 08/15/2020 Medical Record Number: [id number] Patient Account Number: [id number] Date of Birth/Sex: [birth year] (60 y.o. M) Treating RN: Dolan Amen Primary Care Provider: Dorisann Frames Other Clinician: Jeanine Luz Referring Provider: Dorisann Frames Treating Provider/Extender: [name] in Treatment: 6 Diagnosis Coding ICD-10 Codes Code Description [phone] Unspecified open wound, left lower leg, initial encounter 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 layer exposed N18.6 End stage renal disease Z99.2 Dependence on renal dialysis F20.89 Other schizophrenia Facility  Procedures CPT4 Code: [id number] Description: (Facility Use Only) [id number] - [place] LWR LT LEG Modifier: Quantity: 1 Physician Procedures CPT4 [id number] Description: [id number] - WC PHYS LEVEL 3 - EST PT Modifier: Quantity: 1 CPT4 Code: Description: ICD-10 Diagnosis Description S81.802A Unspecified open wound, left lower leg, initial encounter 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 lay N18.6 End stage renal disease Z99.2 Dependence on renal dialysis Modifier: er exposed Quantity: Electronic Signature(s) Signed: 08/15/2020 9:59:49 AM By: Worthy Keeler PA-C Entered By: Worthy Keeler on 08/15/2020 09:59:49

## 2020-08-16 NOTE — Progress Notes (Signed)
Hornbrook, Abbott L. ([id number]) Visit Report for 08/15/2020 Arrival Information Details Patient Name: Todd Brown, [name] Date of Service: 08/15/2020 9:15 AM Medical Record Number: [id number] Patient Account Number: [id number] Date of Birth/Sex: 1961-03-24 (60 y.o. M) Treating RN: Cornell Barman Primary Care Lizzet Hendley: Dorisann Frames Other Clinician: Jeanine Luz Referring Suha Schoenbeck: Dorisann Frames Treating Stpehen Petitjean/Extender: [name] in Treatment: 6 Visit Information History Since Last Visit Has Dressing in Place as Prescribed: Yes Patient Arrived: Wheel Chair Pain Present Now: No Arrival Time: 09:26 Accompanied By: caregiver Transfer Assistance: Manual Patient Identification Verified: Yes Secondary Verification Process Completed: Yes Patient Requires Transmission-Based Precautions: No Patient Has Alerts: Yes Patient Alerts: 07/25/2020 ABI left [place]>220 Electronic Signature(s) Signed: 08/16/2020 5:20:30 PM By: Gretta Cool, BSN, RN, CWS, Kim RN, BSN Entered By: Gretta Cool, BSN, RN, CWS, Kim on 08/15/2020 09:28:12 Villalpando, TRUE L. ([id number]) -------------------------------------------------------------------------------- Clinic Level of Care Assessment Details Patient Name: Scollard, Alois L. Date of Service: 08/15/2020 9:15 AM Medical Record Number: [id number] Patient Account Number: [id number] Date of Birth/Sex: 27-Jul-1960 (60 y.o. M) Treating RN: Dolan Amen Primary Care Cynara Tatham: Dorisann Frames Other Clinician: Jeanine Luz Referring Itzael Liptak: Dorisann Frames Treating Kayston Jodoin/Extender: [name] in Treatment: 6 Clinic Level of Care Assessment Items TOOL 1 Quantity Score _0  - Use when EandM and Procedure is performed on INITIAL visit 0 ASSESSMENTS - Nursing Assessment / Reassessment _1  - General Physical Exam (combine w/ comprehensive assessment (listed just below) when performed on new 0 pt. evals) _2  - 0 Comprehensive Assessment (HX, ROS, Risk Assessments,  Wounds Hx, etc.) ASSESSMENTS - Wound and Skin Assessment / Reassessment _3  - Dermatologic / Skin Assessment (not related to wound area) 0 ASSESSMENTS - Ostomy and/or Continence Assessment and Care _4  - Incontinence Assessment and Management 0 _5  - 0 Ostomy Care Assessment and Management (repouching, etc.) PROCESS - Coordination of Care _6  - Simple Patient / Family Education for ongoing care 0 _7  - 0 Complex (extensive) Patient / Family Education for ongoing care _8  - 0 Staff obtains Programmer, systems, Records, Test Results / Process Orders _9  - 0 Staff telephones HHA, Nursing Homes / Clarify orders / etc _10  - 0 Routine Transfer to another Facility (non-emergent condition) _11  - 0 Routine Hospital Admission (non-emergent condition) _12  - 0 New Admissions / Biomedical engineer / Ordering NPWT, Apligraf, etc. _13  - 0 Emergency Hospital Admission (emergent condition) PROCESS - Special Needs _14  - Pediatric / Minor Patient Management 0 _15  - 0 Isolation Patient Management _16  - 0 Hearing / Language / Visual special needs _17  - 0 Assessment of Community assistance (transportation, D/C planning, etc.) _18  - 0 Additional assistance / Altered mentation _19  - 0 Support Surface(s) Assessment (bed, cushion, seat, etc.) INTERVENTIONS - Miscellaneous _20  - External ear exam 0 _21  - 0 Patient Transfer (multiple staff / Civil Service fast streamer / Similar devices) _22  - 0 Simple Staple / Suture removal (25 or less) _23  - 0 Complex Staple / Suture removal (26 or more) _24  - 0 Hypo/Hyperglycemic Management (do not check if billed separately) _25  - 0 Ankle / Brachial Index (ABI) - do not check if billed separately Has the patient been seen at the hospital within the last three years: Yes Total Score: 0 Level Of Care: ____ Gloria, Amil L. ([id number]) Electronic Signature(s) Signed: 08/15/2020 4:04:06 PM By: Georges Mouse, Minus Breeding RN Entered By: Georges Mouse, Minus Breeding on 08/15/2020 09:58:04 Heffern, William L.  ([id number]) -------------------------------------------------------------------------------- Compression Therapy Details Patient Name: Metzgar, [name] Date of Service: 08/15/2020 9:15 AM Medical Record Number: [id number] Patient Account Number: [id number] Date  of Birth/Sex: 06/06/1960 (60 y.o. M) Treating RN: Dolan Amen Primary Care Cataleia Gade: Dorisann Frames Other Clinician: Jeanine Luz Referring [name]: Dorisann Frames Treating Jilliana Burkes/Extender: [name] in Treatment: 6 Compression Therapy Performed for Wound Assessment: Wound #1 Left,Anterior Lower Leg Performed By: Clinician Dolan Amen, RN Compression Type: Three Layer Post Procedure Diagnosis Same as Pre-procedure Notes pt tolerating wrap well Electronic Signature(s) Signed: 08/15/2020 4:04:06 PM By: Georges Mouse, Minus Breeding RN Entered By: Georges Mouse, Kenia on 08/15/2020 09:57:25 Zelada, Matthieu L. ([id number]) -------------------------------------------------------------------------------- Encounter Discharge Information Details Patient Name: Zerby, Ladell L. Date of Service: 08/15/2020 9:15 AM Medical Record Number: [id number] Patient Account Number: [id number] Date of Birth/Sex: 1960-10-12 (60 y.o. M) Treating RN: Carlene Coria Primary Care Everette Dimauro: Dorisann Frames Other Clinician: Jeanine Luz Referring Cassandre Oleksy: Dorisann Frames Treating Jamall Strohmeier/Extender: [name] in Treatment: 6 Encounter Discharge Information Items Discharge Condition: Stable Ambulatory Status: Wheelchair Discharge Destination: Home Transportation: Private Auto Accompanied By: caregiver Schedule Follow-up Appointment: Yes Clinical Summary of Care: Patient Declined Electronic Signature(s) Signed: 08/15/2020 3:44:01 PM By: Carlene Coria RN Entered By: Carlene Coria on 08/15/2020 10:18:03 Elsasser, Araceli L. ([id number]) -------------------------------------------------------------------------------- Lower  Extremity Assessment Details Patient Name: Parchment, Rhian L. Date of Service: 08/15/2020 9:15 AM Medical Record Number: [id number] Patient Account Number: [id number] Date of Birth/Sex: [birth year] (60 y.o. M) Treating RN: Cornell Barman Primary Care Forrester Blando: Dorisann Frames Other Clinician: Jeanine Luz Referring Kevia Zaucha: Dorisann Frames Treating Xzaria Teo/Extender: [name] in Treatment: 6 Edema Assessment Assessed: [Left: No] [Right: No] Edema: [Left: Ye] [Right: s] Calf Left: Right: Point of Measurement: 39 cm From Medial Instep 32 cm Ankle Left: Right: Point of Measurement: 10 cm From Medial Instep 23.5 cm Knee To Floor Left: Right: From Medial Instep 55 cm Vascular Assessment Pulses: Dorsalis Pedis Palpable: [Left:Yes] Electronic Signature(s) Signed: 08/16/2020 5:20:30 PM By: Gretta Cool, BSN, RN, CWS, Kim RN, BSN Entered By: Gretta Cool, BSN, RN, CWS, Kim on 08/15/2020 09:37:42 Suarez, Eulogio L. ([id number]) -------------------------------------------------------------------------------- Multi Wound Chart Details Patient Name: Coffel, Jemell L. Date of Service: 08/15/2020 9:15 AM Medical Record Number: [id number] Patient Account Number: [id number] Date of Birth/Sex: 26-Jan-1961 (60 y.o. M) Treating RN: Dolan Amen Primary Care Jahne Krukowski: Dorisann Frames Other Clinician: Jeanine Luz Referring Chiyeko Ferre: Dorisann Frames Treating Dorotea Hand/Extender: [name] in Treatment: 6 Vital Signs Height(in): 75 Pulse(bpm): 23 Weight(lbs): 190 Blood Pressure(mmHg): 136/76 Body Mass Index(BMI): 24 Temperature(F): 98.4 Respiratory Rate(breaths/min): 18 Photos: [1:No Photos] [N/A:N/A] Wound Location: [1:Left, Anterior Lower Leg] [N/A:N/A] Wounding Event: [1:Laceration] [N/A:N/A] Primary Etiology: [1:Trauma, Other] [N/A:N/A] Comorbid History: [1:Chronic Obstructive Pulmonary Disease (COPD), Hypertension] [N/A:N/A] Date Acquired: [1:06/18/2020] [N/A:N/A] Weeks of  Treatment: [1:6] [N/A:N/A] Wound Status: [1:Open] [N/A:N/A] Measurements L x W x D (cm) [1:2.3x3.1x0.1] [N/A:N/A] Area (cm) : [1:5.6] [N/A:N/A] Volume (cm) : [1:0.56] [N/A:N/A] % Reduction in Area: [1:89.60%] [N/A:N/A] % Reduction in Volume: [1:97.40%] [N/A:N/A] Classification: [1:Full Thickness Without Exposed Support Structures] [N/A:N/A] Exudate Amount: [1:Medium] [N/A:N/A] Exudate Type: [1:Serous] [N/A:N/A] Exudate Color: [1:amber] [N/A:N/A] Wound Margin: [1:Flat and Intact] [N/A:N/A] Granulation Amount: [1:Large (67-100%)] [N/A:N/A] Granulation Quality: [1:Red, Friable] [N/A:N/A] Necrotic Amount: [1:Small (1-33%)] [N/A:N/A] Exposed Structures: [1:Fat Layer (Subcutaneous Tissue): Yes Fascia: No Tendon: No Muscle: No Joint: No Bone: No None] [N/A:N/A N/A] Treatment Notes Electronic Signature(s) Signed: 08/15/2020 4:04:06 PM By: Georges Mouse, Minus Breeding RN Entered By: Georges Mouse, Minus Breeding on 08/15/2020 09:57:03 Hippe, Lillard L. ([id number]) -------------------------------------------------------------------------------- Multi-Disciplinary Care Plan Details Patient Name: Saulters, Raider L. Date of Service: 08/15/2020 9:15 AM Medical Record Number: [id number] Patient Account Number: [id number] Date of Birth/Sex: 1960/07/29 (60 y.o. M) Treating  RN: Dolan Amen Primary Care Nickholas Goldston: Dorisann Frames Other Clinician: Jeanine Luz Referring Veatrice Eckstein: Dorisann Frames Treating Nayelli Inglis/Extender: [name] in Treatment: 6 Active Inactive Wound/Skin Impairment Nursing Diagnoses: Impaired tissue integrity Goals: Patient/caregiver will verbalize understanding of skin care regimen Date Initiated: 07/04/2020 Date Inactivated: 07/18/2020 Target Resolution Date: 07/04/2020 Goal Status: Met Ulcer/skin breakdown will have a volume reduction of 30% by week 4 Date Initiated: 07/04/2020 Target Resolution Date: 08/01/2020 Goal Status: Active Ulcer/skin breakdown will have a volume  reduction of 50% by week 8 Date Initiated: 07/04/2020 Target Resolution Date: 09/01/2020 Goal Status: Active Interventions: Assess patient/caregiver ability to obtain necessary supplies Assess patient/caregiver ability to perform ulcer/skin care regimen upon admission and as needed Assess ulceration(s) every visit Treatment Activities: Skin care regimen initiated : 07/04/2020 Topical wound management initiated : 07/04/2020 Notes: Electronic Signature(s) Signed: 08/15/2020 4:04:06 PM By: Georges Mouse, Minus Breeding RN Entered By: Georges Mouse, Minus Breeding on 08/15/2020 09:56:56 Kovaleski, Richardo L. ([id number]) -------------------------------------------------------------------------------- Pain Assessment Details Patient Name: Irby, Yuji L. Date of Service: 08/15/2020 9:15 AM Medical Record Number: [id number] Patient Account Number: [id number] Date of Birth/Sex: September 04, 1960 (60 y.o. M) Treating RN: Cornell Barman Primary Care Siennah Barrasso: Dorisann Frames Other Clinician: Jeanine Luz Referring Benetta Maclaren: Dorisann Frames Treating Lindalou Soltis/Extender: [name] in Treatment: 6 Active Problems Location of Pain Severity and Description of Pain Patient Has Paino No Site Locations Pain Management and Medication Current Pain Management: Notes pt denies pain at this time Electronic Signature(s) Signed: 08/16/2020 5:20:30 PM By: Gretta Cool, BSN, RN, CWS, Kim RN, BSN Entered By: Gretta Cool, BSN, RN, CWS, Kim on 08/15/2020 09:29:35 Lachman, Omari L. ([id number]) -------------------------------------------------------------------------------- Patient/Caregiver Education Details Patient Name: Salvato, Antwian L. Date of Service: 08/15/2020 9:15 AM Medical Record Number: [id number] Patient Account Number: [id number] Date of Birth/Gender: 06-18-1960 (60 y.o. M) Treating RN: Dolan Amen Primary Care Physician: Dorisann Frames Other Clinician: Jeanine Luz Referring Physician: Dorisann Frames Treating  Physician/Extender: [name] in Treatment: 6 Education Assessment Education Provided To: Caregiver Education Topics Provided Wound/Skin Impairment: Methods: Explain/Verbal Responses: State content correctly [employer]) Signed: 08/15/2020 4:04:06 PM By: Georges Mouse, Minus Breeding RN Entered By: Georges Mouse, Minus Breeding on 08/15/2020 09:58:25 Braggs, Essa L. ([id number]) -------------------------------------------------------------------------------- Wound Assessment Details Patient Name: Medina, Almin L. Date of Service: 08/15/2020 9:15 AM Medical Record Number: [id number] Patient Account Number: [id number] Date of Birth/Sex: 1960/06/29 (60 y.o. M) Treating RN: Cornell Barman Primary Care Kagan Mutchler: Dorisann Frames Other Clinician: Jeanine Luz Referring Sammie Denner: Dorisann Frames Treating Mycal Conde/Extender: [name] in Treatment: 6 Wound Status Wound Number: 1 Primary Trauma, Other Etiology: Wound Location: Left, Anterior Lower Leg Wound Status: Open Wounding Event: Laceration Comorbid Chronic Obstructive Pulmonary Disease (COPD), Date Acquired: 06/18/2020 History: Hypertension Weeks Of Treatment: 6 Clustered Wound: No Photos Photo Uploaded By: Gretta Cool, BSN, RN, CWS, Kim on 08/15/2020 13:54:53 Wound Measurements Length: (cm) 2.3 Width: (cm) 3.1 Depth: (cm) 0.1 Area: (cm) 5.6 Volume: (cm) 0.56 % Reduction in Area: 89.6% % Reduction in Volume: 97.4% Epithelialization: None Tunneling: No Undermining: No Wound Description Classification: Full Thickness Without Exposed Support Structures Wound Margin: Flat and Intact Exudate Amount: Medium Exudate Type: Serous Exudate Color: amber Foul Odor After Cleansing: No Slough/Fibrino Yes Wound Bed Granulation Amount: Large (67-100%) Exposed Structure Granulation Quality: Red, Friable Fascia Exposed: No Necrotic Amount: Small (1-33%) Fat Layer (Subcutaneous Tissue) Exposed: Yes Necrotic Quality:  Adherent Slough Tendon Exposed: No Muscle Exposed: No Joint Exposed: No Bone Exposed: No Treatment Notes Wound #1 (Lower Leg) Wound Laterality: Left, Anterior Cleanser Normal Saline Discharge Instruction: Wash your  hands with soap and water. Remove old dressing, discard into plastic bag and place into trash. Cleanse the wound with Normal Saline prior to applying a clean dressing using gauze sponges, not tissues or cotton balls. Do not Eick, Dreyton L. ([id number]) scrub or use excessive force. Pat dry using gauze sponges, not tissue or cotton balls. Wound Cleanser Discharge Instruction: Wash your hands with soap and water. Remove old dressing, discard into plastic bag and place into trash. Cleanse the wound with Wound Cleanser prior to applying a clean dressing using gauze sponges, not tissues or cotton balls. Do not scrub or use excessive force. Pat dry using gauze sponges, not tissue or cotton balls. Peri-Wound Care Topical Primary Dressing Hydrofera Blue Ready Transfer Foam, 4x5 (in/in) Discharge Instruction: Apply Hydrofera Blue Ready to wound bed as directed Secondary Dressing Xtrasorb Medium 4x5 (in/in) Discharge Instruction: Apply to wound as directed. Do not cut. Secured With Compression Wrap Profore Lite LF 3 Multilayer Compression Bandaging System Discharge Instruction: Apply 3 multi-layer wrap as prescribed. Compression Stockings Environmental education officer) Signed: 08/16/2020 5:20:30 PM By: Gretta Cool, BSN, RN, CWS, Kim RN, BSN Entered By: Gretta Cool, BSN, RN, CWS, Kim on 08/15/2020 09:36:40 Jolly, Jaeden L. ([id number]) -------------------------------------------------------------------------------- Vitals Details Patient Name: Yates, Robyn L. Date of Service: 08/15/2020 9:15 AM Medical Record Number: [id number] Patient Account Number: [id number] Date of Birth/Sex: May 22, 1961 (60 y.o. M) Treating RN: Cornell Barman Primary Care Dannilynn Gallina: Dorisann Frames Other Clinician:  Jeanine Luz Referring Shanetta Nicolls: Dorisann Frames Treating Maximillion Gill/Extender: [name] in Treatment: 6 Vital Signs Time Taken: 09:28 Temperature (F): 98.4 Height (in): 75 Pulse (bpm): 70 Weight (lbs): 190 Respiratory Rate (breaths/min): 18 Body Mass Index (BMI): 23.7 Blood Pressure (mmHg): 136/76 Reference Range: 80 - 120 mg / dl Electronic Signature(s) Signed: 08/16/2020 5:20:30 PM By: Gretta Cool, BSN, RN, CWS, Kim RN, BSN Entered By: Gretta Cool, BSN, RN, CWS, Kim on 08/15/2020 26:08:88

## 2020-08-22 ENCOUNTER — Ambulatory Visit: Payer: Medicaid Other | Admitting: Physician Assistant

## 2020-08-29 ENCOUNTER — Other Ambulatory Visit: Payer: Self-pay

## 2020-08-29 ENCOUNTER — Encounter: Payer: Medicaid Other | Admitting: Physician Assistant

## 2020-08-29 DIAGNOSIS — L97822 Non-pressure chronic ulcer of other part of left lower leg with fat layer exposed: Secondary | ICD-10-CM | POA: Diagnosis not present

## 2020-08-29 NOTE — Progress Notes (Signed)
Mullendore, Kayon L. ([id number]) Visit Report for 08/29/2020 Arrival Information Details Patient Name: NUNZIATA, Aedan L. Date of Service: 08/29/2020 9:15 AM Medical Record Number: [id number] Patient Account Number: [id number] Date of Birth/Sex: 01/11/1961 (60 y.o. M) Treating RN: Dolan Amen Primary Care Chiann Goffredo: Dorisann Frames Other Clinician: Jeanine Luz Referring Jolyssa Oplinger: Dorisann Frames Treating Pantera Winterrowd/Extender: [name] in Treatment: 8 Visit Information History Since Last Visit Added or deleted any medications: No Patient Arrived: Wheel Chair Had a fall or experienced change in No Arrival Time: 09:38 activities of daily living that may affect Accompanied By: caregiver risk of falls: Transfer Assistance: None Hospitalized since last visit: No Patient Identification Verified: Yes Pain Present Now: Yes Secondary Verification Process Completed: Yes Patient Requires Transmission-Based Precautions: No Patient Has Alerts: Yes Patient Alerts: 07/25/2020 ABI left [place]>220 Electronic Signature(s) Signed: 08/29/2020 4:38:23 PM By: Jeanine Luz Entered By: Jeanine Luz on 08/29/2020 09:39:22 Foye, Bladimir L. ([id number]) -------------------------------------------------------------------------------- Clinic Level of Care Assessment Details Patient Name: Pantaleo, Keelon L. Date of Service: 08/29/2020 9:15 AM Medical Record Number: [id number] Patient Account Number: [id number] Date of Birth/Sex: Jan 03, 1961 (61 y.o. M) Treating RN: Dolan Amen Primary Care Danah Reinecke: Dorisann Frames Other Clinician: Jeanine Luz Referring Kayelynn Abdou: Dorisann Frames Treating Marae Cottrell/Extender: [name] in Treatment: 8 Clinic Level of Care Assessment Items TOOL 1 Quantity Score _0  - Use when EandM and Procedure is performed on INITIAL visit 0 ASSESSMENTS - Nursing Assessment / Reassessment _1  - General Physical Exam (combine w/ comprehensive assessment (listed  just below) when performed on new 0 pt. evals) _2  - 0 Comprehensive Assessment (HX, ROS, Risk Assessments, Wounds Hx, etc.) ASSESSMENTS - Wound and Skin Assessment / Reassessment _3  - Dermatologic / Skin Assessment (not related to wound area) 0 ASSESSMENTS - Ostomy and/or Continence Assessment and Care _4  - Incontinence Assessment and Management 0 _5  - 0 Ostomy Care Assessment and Management (repouching, etc.) PROCESS - Coordination of Care _6  - Simple Patient / Family Education for ongoing care 0 _7  - 0 Complex (extensive) Patient / Family Education for ongoing care _8  - 0 Staff obtains Programmer, systems, Records, Test Results / Process Orders _9  - 0 Staff telephones HHA, Nursing Homes / Clarify orders / etc _10  - 0 Routine Transfer to another Facility (non-emergent condition) _11  - 0 Routine Hospital Admission (non-emergent condition) _12  - 0 New Admissions / Biomedical engineer / Ordering NPWT, Apligraf, etc. _13  - 0 Emergency Hospital Admission (emergent condition) PROCESS - Special Needs _14  - Pediatric / Minor Patient Management 0 _15  - 0 Isolation Patient Management _16  - 0 Hearing / Language / Visual special needs _17  - 0 Assessment of Community assistance (transportation, D/C planning, etc.) _18  - 0 Additional assistance / Altered mentation _19  - 0 Support Surface(s) Assessment (bed, cushion, seat, etc.) INTERVENTIONS - Miscellaneous _20  - External ear exam 0 _21  - 0 Patient Transfer (multiple staff / Civil Service fast streamer / Similar devices) _22  - 0 Simple Staple / Suture removal (25 or less) _23  - 0 Complex Staple / Suture removal (26 or more) _24  - 0 Hypo/Hyperglycemic Management (do not check if billed separately) _25  - 0 Ankle / Brachial Index (ABI) - do not check if billed separately Has the patient been seen at the hospital within the last three years: Yes Total Score: 0 Level Of Care: ____ Ritson, Kaidyn L. ([id number]) Electronic Signature(s) Signed: 08/29/2020 5:09:32 PM By:  Georges Mouse, Minus Breeding RN Entered By: Georges Mouse, Minus Breeding on 08/29/2020 10:15:52 Duling, Jeremih L. ([id number]) -------------------------------------------------------------------------------- Compression Therapy Details Patient Name: Tipps, Luke L. Date  of Service: 08/29/2020 9:15 AM Medical Record Number: [id number] Patient Account Number: [id number] Date of Birth/Sex: January 28, 1961 (60 y.o. M) Treating RN: Dolan Amen Primary Care Juliette Standre: Dorisann Frames Other Clinician: Jeanine Luz Referring Mahiya Kercheval: Dorisann Frames Treating Regginald Pask/Extender: [name] in Treatment: 8 Compression Therapy Performed for Wound Assessment: Wound #1 Left,Anterior Lower Leg Performed By: Clinician Dolan Amen, RN Compression Type: Three Layer Post Procedure Diagnosis Same as Pre-procedure Notes pt tolerating wrap well Electronic Signature(s) Signed: 08/29/2020 5:09:32 PM By: Georges Mouse, Minus Breeding RN Entered By: Georges Mouse, Kenia on 08/29/2020 10:14:38 Machamer, Jovanni L. ([id number]) -------------------------------------------------------------------------------- Lower Extremity Assessment Details Patient Name: Moat, Arrian L. Date of Service: 08/29/2020 9:15 AM Medical Record Number: [id number] Patient Account Number: [id number] Date of Birth/Sex: 12-04-1960 (60 y.o. M) Treating RN: Dolan Amen Primary Care Jariyah Hackley: Dorisann Frames Other Clinician: Jeanine Luz Referring Darrel Baroni: Dorisann Frames Treating Eliud Polo/Extender: [name] in Treatment: 8 Edema Assessment Assessed: [Left: No] Patrice Paradise: No] [Left: Edema] [Right: :] Calf Left: Right: Point of Measurement: 39 cm From Medial Instep 32 cm Ankle Left: Right: Point of Measurement: 10 cm From Medial Instep 21 cm Vascular Assessment Pulses: Dorsalis Pedis Palpable: [Left:Yes] Electronic Signature(s) Signed: 08/29/2020 4:38:23 PM By: Jeanine Luz Signed: 08/29/2020 5:09:32 PM By: Georges Mouse, Minus Breeding RN Entered By: Jeanine Luz on 08/29/2020 09:51:23 Bebee, Jase L. ([id number]) -------------------------------------------------------------------------------- Multi Wound Chart Details Patient Name: Hindes, Tobias L. Date of Service: 08/29/2020 9:15 AM Medical Record Number: [id number] Patient Account Number: [id number] Date of Birth/Sex: Sep 02, 1960 (60 y.o. M) Treating RN: Dolan Amen Primary Care Florabelle Cardin: Dorisann Frames Other Clinician: Jeanine Luz Referring Shooter Tangen: Dorisann Frames Treating Aidan Moten/Extender: [name] in Treatment: 8 Vital Signs Height(in): 75 Pulse(bpm): 38 Weight(lbs): 190 Blood Pressure(mmHg): 51/51 Body Mass Index(BMI): 24 Temperature(F): 98.3 Respiratory Rate(breaths/min): 20 Photos: [N/A:N/A] Wound Location: Left, Anterior Lower Leg N/A N/A Wounding Event: Laceration N/A N/A Primary Etiology: Trauma, Other N/A N/A Comorbid History: Chronic Obstructive Pulmonary N/A N/A Disease (COPD), Hypertension Date Acquired: 06/18/2020 N/A N/A Weeks of Treatment: 8 N/A N/A Wound Status: Open N/A N/A Measurements L x W x D (cm) 2x2.3x0.1 N/A N/A Area (cm) : 3.613 N/A N/A Volume (cm) : 0.361 N/A N/A % Reduction in Area: 93.30% N/A N/A % Reduction in Volume: 98.30% N/A N/A Classification: Full Thickness Without Exposed N/A N/A Support Structures Exudate Amount: Medium N/A N/A Exudate Type: Serous N/A N/A Exudate Color: amber N/A N/A Wound Margin: Flat and Intact N/A N/A Granulation Amount: Large (67-100%) N/A N/A Granulation Quality: Red, Friable N/A N/A Necrotic Amount: None Present (0%) N/A N/A Exposed Structures: Fat Layer (Subcutaneous Tissue): N/A N/A Yes Fascia: No Tendon: No Muscle: No Joint: No Bone: No Epithelialization: None N/A N/A Treatment Notes Electronic Signature(s) Signed: 08/29/2020 5:09:32 PM By: Georges Mouse, Minus Breeding RN Entered By: Georges Mouse, Minus Breeding on 08/29/2020 10:14:19 Serda,  Camari L. ([id number]) -------------------------------------------------------------------------------- Multi-Disciplinary Care Plan Details Patient Name: Ress, Tremane L. Date of Service: 08/29/2020 9:15 AM Medical Record Number: [id number] Patient Account Number: [id number] Date of Birth/Sex: [birth year] (60 y.o. M) Treating RN: Dolan Amen Primary Care Caitlan Chauca: Dorisann Frames Other Clinician: Jeanine Luz Referring Gentri Guardado: Dorisann Frames Treating Ahnya Akre/Extender: [name] in Treatment: 8 Active Inactive Wound/Skin Impairment Nursing Diagnoses: Impaired tissue integrity Goals: Patient/caregiver will verbalize understanding of skin care regimen Date Initiated: 07/04/2020 Date Inactivated: 07/18/2020 Target Resolution Date: 07/04/2020 Goal Status: Met Ulcer/skin breakdown will have a volume reduction of 30% by week 4 Date Initiated: 07/04/2020 Date Inactivated: 08/29/2020 Target Resolution Date: 08/01/2020 Goal Status: Met  Ulcer/skin breakdown will have a volume reduction of 50% by week 8 Date Initiated: 07/04/2020 Target Resolution Date: 09/01/2020 Goal Status: Active Interventions: Assess patient/caregiver ability to obtain necessary supplies Assess patient/caregiver ability to perform ulcer/skin care regimen upon admission and as needed Assess ulceration(s) every visit Treatment Activities: Skin care regimen initiated : 07/04/2020 Topical wound management initiated : 07/04/2020 Notes: Electronic Signature(s) Signed: 08/29/2020 5:09:32 PM By: Georges Mouse, Minus Breeding RN Entered By: Georges Mouse, Minus Breeding on 08/29/2020 10:14:11 Soller, Tye L. ([id number]) -------------------------------------------------------------------------------- Pain Assessment Details Patient Name: Brenner, Clyde L. Date of Service: 08/29/2020 9:15 AM Medical Record Number: [id number] Patient Account Number: [id number] Date of Birth/Sex: Apr 19, 1961 (60 y.o. M) Treating RN: Dolan Amen Primary Care Corene Resnick: Dorisann Frames Other Clinician: Jeanine Luz Referring Anatalia Kronk: Dorisann Frames Treating Deuntae Kocsis/Extender: [name] in Treatment: 8 Active Problems Location of Pain Severity and Description of Pain Patient Has Paino Yes Site Locations Rate the pain. Current Pain Level: 6 Pain Management and Medication Current Pain Management: Electronic Signature(s) Signed: 08/29/2020 4:38:23 PM By: Jeanine Luz Signed: 08/29/2020 5:09:32 PM By: Georges Mouse, Minus Breeding RN Entered By: Jeanine Luz on 08/29/2020 09:39:50 Schutt, Jakarie L. ([id number]) -------------------------------------------------------------------------------- Patient/Caregiver Education Details Patient Name: Cue, Garren L. Date of Service: 08/29/2020 9:15 AM Medical Record Number: [id number] Patient Account Number: [id number] Date of Birth/Gender: 1960-08-29 (60 y.o. M) Treating RN: Dolan Amen Primary Care Physician: Dorisann Frames Other Clinician: Jeanine Luz Referring Physician: Dorisann Frames Treating Physician/Extender: [name] in Treatment: 8 Education Assessment Education Provided To: Patient Education Topics Provided Wound/Skin Impairment: Methods: Explain/Verbal Responses: State content correctly Electronic Signature(s) Signed: 08/29/2020 5:09:32 PM By: Georges Mouse, Minus Breeding RN Entered By: Georges Mouse, Minus Breeding on 08/29/2020 10:16:15 Brownlow, Branston L. ([id number]) -------------------------------------------------------------------------------- Wound Assessment Details Patient Name: Wigen, Hermilo L. Date of Service: 08/29/2020 9:15 AM Medical Record Number: [id number] Patient Account Number: [id number] Date of Birth/Sex: [birth year] (60 y.o. M) Treating RN: Dolan Amen Primary Care Caty Tessler: Dorisann Frames Other Clinician: Jeanine Luz Referring San Lohmeyer: Dorisann Frames Treating Chrisy Hillebrand/Extender: [name] in  Treatment: 8 Wound Status Wound Number: 1 Primary Trauma, Other Etiology: Wound Location: Left, Anterior Lower Leg Wound Status: Open Wounding Event: Laceration Comorbid Chronic Obstructive Pulmonary Disease (COPD), Date Acquired: 06/18/2020 History: Hypertension Weeks Of Treatment: 8 Clustered Wound: No Photos Wound Measurements Length: (cm) 2 Width: (cm) 2.3 Depth: (cm) 0.1 Area: (cm) 3.613 Volume: (cm) 0.361 % Reduction in Area: 93.3% % Reduction in Volume: 98.3% Epithelialization: None Tunneling: No Undermining: No Wound Description Classification: Full Thickness Without Exposed Support Structu Wound Margin: Flat and Intact Exudate Amount: Medium Exudate Type: Serous Exudate Color: amber res Foul Odor After Cleansing: No Slough/Fibrino No Wound Bed Granulation Amount: Large (67-100%) Exposed Structure Granulation Quality: Red, Friable Fascia Exposed: No Necrotic Amount: None Present (0%) Fat Layer (Subcutaneous Tissue) Exposed: Yes Tendon Exposed: No Muscle Exposed: No Joint Exposed: No Bone Exposed: No Electronic Signature(s) Signed: 08/29/2020 4:38:23 PM By: Jeanine Luz Signed: 08/29/2020 5:09:32 PM By: Georges Mouse, Minus Breeding RN Entered By: Jeanine Luz on 08/29/2020 09:48:55 Bley, Cayton L. ([id number]) -------------------------------------------------------------------------------- Vitals Details Patient Name: Zammit, Mychal L. Date of Service: 08/29/2020 9:15 AM Medical Record Number: [id number] Patient Account Number: [id number] Date of Birth/Sex: [birth year] (60 y.o. M) Treating RN: Dolan Amen Primary Care Jannett Schmall: Dorisann Frames Other Clinician: Jeanine Luz Referring Dejae Bernet: Dorisann Frames Treating Waymond Meador/Extender: [name] in Treatment: 8 Vital Signs Time Taken: 09:35 Temperature (F): 98.3 Height (in): 75 Pulse (bpm): 87 Weight (lbs): 190 Respiratory Rate (breaths/min): 20 Body Mass Index (  BMI):  23.7 Blood Pressure (mmHg): 94/51 Reference Range: 80 - 120 mg / dl Electronic Signature(s) Signed: 08/29/2020 4:38:23 PM By: Jeanine Luz Entered By: Jeanine Luz on 08/29/2020 09:39:42

## 2020-08-29 NOTE — Progress Notes (Addendum)
Riggi, Hampton L. ([id number]) Visit Report for 08/29/2020 Chief Complaint Document Details Patient Name: Todd [name], [name] Date of Service: 08/29/2020 9:15 AM Medical Record Number: [id number] Patient Account Number: [id number] Date of Birth/Sex: 09-11-1960 (60 y.o. M) Treating RN: Dolan Amen Primary Care Provider: Dorisann Frames Other Clinician: Jeanine Luz Referring Provider: Dorisann Frames Treating Provider/Extender: [name] in Treatment: 8 Information Obtained from: Patient Chief Complaint Left LE Traumatic Ulcer Electronic Signature(s) Signed: 08/29/2020 9:58:14 AM By: Worthy Keeler PA-C Entered By: Worthy Keeler on 08/29/2020 09:58:14 Bruski, Trust L. ([id number]) -------------------------------------------------------------------------------- HPI Details Patient Name: [name], [name] Date of Service: 08/29/2020 9:15 AM Medical Record Number: [id number] Patient Account Number: [id number] Date of Birth/Sex: 05-07-1961 (60 y.o. M) Treating RN: Dolan Amen Primary Care Provider: Dorisann Frames Other Clinician: Jeanine Luz Referring Provider: Dorisann Frames Treating Provider/Extender: [name] in Treatment: 8 History of Present Illness HPI Description: 07/04/2020 patient presents today for initial evaluation here in our clinic. He is actually a ward of the state. With that being said he is only been in the group home that he is currently staying in for the past few weeks in fact it was only about 2 weeks. He was seen in the ED for evaluation on 06/18/2020 due to an injury that he sustained to his left lower leg. He tells me that he got up in the middle the night to go to the bathroom or something and tripped over a wheelchair causing a significant skin tear/laceration to the leg. Unfortunately this was sutured but has dehisced in some areas and appears to have a significant hematoma and others. The proximal portion of the wound has  actually necrosis and the skin flap is can have to be removed. The inferior portion appears to still be healthy and has reattached to some degree along the suture line this is still somewhat dehiscing and is can require some sharp debridement. Fortunately there does not appear to be any signs of active infection at this time. With that being said I do believe that we may want to put him back on some preventative antibiotics, Keflex, just to ensure following the debridement today that he is going to be infection free. Released to the best of our ability. The patient does have a history of end-stage renal disease that he is on dialysis. He also has schizophrenia at this point is now a ward of the state as it was deemed that he was not competent to be able to completely care for himself. Apparently his living condition prior to going to the group home was very poor. With that being said he seems to be doing much better now in my opinion and I do feel like that the location is that is a very good facility. The patient is going require some sharp debridement today and we did talk to [name] who is at the [place] to gain approval for the patient. This was for debridement in particular. We did gain that approval and it was myself in [place] who witnessed this. Upon evaluation today patient appears to be doing a little better in regard to his wound currently. Fortunately there is no signs of active infection at this time. No fevers, chills, nausea, vomiting, or diarrhea. He has been taking the Keflex without complication. With that being said I am concerned today that he did develop a blood clot under the skin flap that skin to prevent him healing. I think we need to  do something to try to prevent this from continuing to be an issue. For that reason I discussed with him the possibility of doing a Unna boot wrap which I think it was going to be the best thing for him as far as  healing is concerned he is in agreement with the plan. 07/18/2020. Upon evaluation today patient's wound bed actually showed signs of improvement compared to previous evaluation. I feel like he is granulating in and does seem to be healing quite nicely. Fortunately there is no evidence of active infection which is great news and overall I think that he is headed in the right direction. 07/25/2020 upon evaluation today patient's wound actually appears to show signs of good improvement. I am actually very pleased with where we stand today. He is actually showing some signs of hypergranulation and I think he could benefit from switching to [facility]. That was discussed with the patient to let them know what was going on today as well. 08/01/2020 upon evaluation today patient appears to be doing excellent in regard to his wound on his leg. Fortunately there is no signs of active infection at this time. No fevers, chills, nausea, vomiting, or diarrhea. 08/15/2020 on evaluation today patient appears to be doing well with regard to his leg ulcer. This is showing signs of excellent improvement overall I am extremely pleased with where we stand he is definitely significantly smaller. 08/29/2020 upon evaluation today patient appears to be doing excellent in regard to his wound. Is been tolerating the dressing changes without complication. Fortunately I think that the patient is making excellent progress and in general I am extremely pleased with where things stand today. Electronic Signature(s) Signed: 08/29/2020 10:24:24 AM By: Worthy Keeler PA-C Entered By: Worthy Keeler on 08/29/2020 10:24:24 Myhre, Jacques L. ([id number]) -------------------------------------------------------------------------------- Physical Exam Details Patient Name: [name], [name] Date of Service: 08/29/2020 9:15 AM Medical Record Number: [id number] Patient Account Number: [id number] Date of Birth/Sex: 05-21-1961 (60 y.o.  M) Treating RN: Dolan Amen Primary Care Provider: Dorisann Frames Other Clinician: Jeanine Luz Referring Provider: Dorisann Frames Treating Provider/Extender: [name] in Treatment: 8 Constitutional Well-nourished and well-hydrated in no acute distress. Respiratory normal breathing without difficulty. Psychiatric this patient is able to make decisions and demonstrates good insight into disease process. Alert and Oriented x 3. pleasant and cooperative. Notes Patient's wound bed showed signs of good granulation epithelization at this point. I think that he is making great progress and overall I am extremely pleased with the appearance of the wound today. Electronic Signature(s) Signed: 08/29/2020 10:31:38 AM By: Worthy Keeler PA-C Entered By: Worthy Keeler on 08/29/2020 10:31:38 Beil, Merton L. ([id number]) -------------------------------------------------------------------------------- Physician Orders Details Patient Name: Finck, Nivaan L. Date of Service: 08/29/2020 9:15 AM Medical Record Number: [id number] Patient Account Number: [id number] Date of Birth/Sex: 1961/03/22 (60 y.o. M) Treating RN: Dolan Amen Primary Care Provider: Dorisann Frames Other Clinician: Jeanine Luz Referring Provider: Dorisann Frames Treating Provider/Extender: [name] in Treatment: 8 Verbal / Phone Orders: No Diagnosis Coding ICD-10 Coding Code Description [phone] Unspecified open wound, left lower leg, initial encounter 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 layer exposed N18.6 End stage renal disease Z99.2 Dependence on renal dialysis F20.89 Other schizophrenia Follow-up Appointments o Return Appointment in 2 weeks. [place]: [facility] o ADMIT to Home Health for wound care. May utilize formulary equivalent dressing for wound treatment orders unless otherwise specified. Home  Health  Nurse may visit PRN to address patientos wound care needs. o Scheduled days for dressing changes to be completed; exception, patient has scheduled wound care visit that day. - Dressing changes Tuesday/Thursday in office/Saturday o **Please direct any NON-WOUND related issues/requests for orders to patient's Primary Care Physician. **If current dressing causes regression in wound condition, may D/C ordered dressing product/s and apply Normal Saline Moist Dressing daily until next [place] or Other MD appointment. **Notify Wound Healing Center of regression in wound condition at [phone]. Anesthetic (Use 'Patient Medications' Section for Anesthetic Order Entry) o Lidocaine applied to wound bed o Benzocaine applied to wound bed. Wound Treatment Wound #1 - Lower Leg Wound Laterality: Left, Anterior Cleanser: Soap and Water 3 x Per Week/30 Days Discharge Instructions: Gently cleanse wound with antibacterial soap, rinse and pat dry prior to dressing wounds Primary Dressing: Hydrofera Blue Ready Transfer Foam, 2.5x2.5 (in/in) 3 x Per Week/30 Days Discharge Instructions: Apply Hydrofera Blue Ready to wound bed as directed Secondary Dressing: ABD Pad 5x9 (in/in) 3 x Per Week/30 Days Discharge Instructions: Cover with ABD pad Compression Wrap: Profore Lite LF 3 Multilayer Compression Bandaging System 3 x Per Week/30 Days Discharge Instructions: Apply 3 multi-layer wrap as prescribed. Electronic Signature(s) Signed: 08/29/2020 5:09:32 PM By: Georges Mouse, Minus Breeding RN Signed: 08/29/2020 5:44:32 PM By: Worthy Keeler PA-C Entered By: Georges Mouse, Minus Breeding on 08/29/2020 10:15:47 Vannote, Chantry L. ([id number]) -------------------------------------------------------------------------------- Problem List Details Patient Name: [name], [name] Date of Service: 08/29/2020 9:15 AM Medical Record Number: [id number] Patient Account Number: [id number] Date of Birth/Sex: 01/15/1961 (60  y.o. M) Treating RN: Dolan Amen Primary Care Provider: Dorisann Frames Other Clinician: Jeanine Luz Referring Provider: Dorisann Frames Treating Provider/Extender: [name] in Treatment: 8 Active Problems ICD-10 Encounter Code Description Active Date MDM Diagnosis S81.802A Unspecified open wound, left lower leg, initial encounter 07/04/2020 No Yes 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 layer 07/04/2020 No Yes exposed N18.6 End stage renal disease 07/04/2020 No Yes Z99.2 Dependence on renal dialysis 07/04/2020 No Yes F20.89 Other schizophrenia 07/04/2020 No Yes Inactive Problems Resolved Problems Electronic Signature(s) Signed: 08/29/2020 9:58:07 AM By: Worthy Keeler PA-C Entered By: Worthy Keeler on 08/29/2020 09:58:07 [name], [name] ([id number]) -------------------------------------------------------------------------------- Progress Note Details Patient Name: [name], [name] Date of Service: 08/29/2020 9:15 AM Medical Record Number: [id number] Patient Account Number: [id number] Date of Birth/Sex: [birth year] (60 y.o. M) Treating RN: Dolan Amen Primary Care Provider: Dorisann Frames Other Clinician: Jeanine Luz Referring Provider: Dorisann Frames Treating Provider/Extender: [name] in Treatment: 8 Subjective Chief Complaint Information obtained from Patient Left LE Traumatic Ulcer History of Present Illness (HPI) 07/04/2020 patient presents today for initial evaluation here in our clinic. He is actually a ward of the state. With that being said he is only been in the group home that he is currently staying in for the past few weeks in fact it was only about 2 weeks. He was seen in the ED for evaluation on 06/18/2020 due to an injury that he sustained to his left lower leg. He tells me that he got up in the middle the night to go to the bathroom or something and tripped over a wheelchair causing a  significant skin tear/laceration to the leg. Unfortunately this was sutured but has dehisced in some areas and appears to have a significant hematoma and others. The proximal portion of the wound has actually necrosis and the skin flap is can have  to be removed. The inferior portion appears to still be healthy and has reattached to some degree along the suture line this is still somewhat dehiscing and is can require some sharp debridement. Fortunately there does not appear to be any signs of active infection at this time. With that being said I do believe that we may want to put him back on some preventative antibiotics, Keflex, just to ensure following the debridement today that he is going to be infection free. Released to the best of our ability. The patient does have a history of end-stage renal disease that he is on dialysis. He also has schizophrenia at this point is now a ward of the state as it was deemed that he was not competent to be able to completely care for himself. Apparently his living condition prior to going to the group home was very poor. With that being said he seems to be doing much better now in my opinion and I do feel like that the location is that is a very good facility. The patient is going require some sharp debridement today and we did talk to [name] who is at the [place] to gain approval for the patient. This was for debridement in particular. We did gain that approval and it was myself in [place] who witnessed this. Upon evaluation today patient appears to be doing a little better in regard to his wound currently. Fortunately there is no signs of active infection at this time. No fevers, chills, nausea, vomiting, or diarrhea. He has been taking the Keflex without complication. With that being said I am concerned today that he did develop a blood clot under the skin flap that skin to prevent him healing. I think we need to do something to try  to prevent this from continuing to be an issue. For that reason I discussed with him the possibility of doing a Unna boot wrap which I think it was going to be the best thing for him as far as healing is concerned he is in agreement with the plan. 07/18/2020. Upon evaluation today patient's wound bed actually showed signs of improvement compared to previous evaluation. I feel like he is granulating in and does seem to be healing quite nicely. Fortunately there is no evidence of active infection which is great news and overall I think that he is headed in the right direction. 07/25/2020 upon evaluation today patient's wound actually appears to show signs of good improvement. I am actually very pleased with where we stand today. He is actually showing some signs of hypergranulation and I think he could benefit from switching to [facility]. That was discussed with the patient to let them know what was going on today as well. 08/01/2020 upon evaluation today patient appears to be doing excellent in regard to his wound on his leg. Fortunately there is no signs of active infection at this time. No fevers, chills, nausea, vomiting, or diarrhea. 08/15/2020 on evaluation today patient appears to be doing well with regard to his leg ulcer. This is showing signs of excellent improvement overall I am extremely pleased with where we stand he is definitely significantly smaller. 08/29/2020 upon evaluation today patient appears to be doing excellent in regard to his wound. Is been tolerating the dressing changes without complication. Fortunately I think that the patient is making excellent progress and in general I am extremely pleased with where things stand today. Objective Constitutional Well-nourished and well-hydrated in no acute  distress. Vitals Time Taken: 9:35 AM, Height: 75 in, Weight: 190 lbs, BMI: 23.7, Temperature: 98.3 F, Pulse: 87 bpm, Respiratory Rate: 20 breaths/min, Blood Pressure: 94/51  mmHg. Respiratory normal breathing without difficulty. Slape, Curran L. ([id number]) Psychiatric this patient is able to make decisions and demonstrates good insight into disease process. Alert and Oriented x 3. pleasant and cooperative. General Notes: Patient's wound bed showed signs of good granulation epithelization at this point. I think that he is making great progress and overall I am extremely pleased with the appearance of the wound today. Integumentary (Hair, Skin) Wound #1 status is Open. Original cause of wound was Laceration. The date acquired was: 06/18/2020. The wound has been in treatment 8 weeks. The wound is located on the Left,Anterior Lower Leg. The wound measures 2cm length x 2.3cm width x 0.1cm depth; 3.613cm^2 area and 0.361cm^3 volume. There is Fat Layer (Subcutaneous Tissue) exposed. There is no tunneling or undermining noted. There is a medium amount of serous drainage noted. The wound margin is flat and intact. There is large (67-100%) red, friable granulation within the wound bed. There is no necrotic tissue within the wound bed. Assessment Active Problems ICD-10 Unspecified open wound, left lower leg, initial encounter Non-pressure chronic ulcer of other part of left lower leg with fat layer exposed End stage renal disease Dependence on renal dialysis Other schizophrenia Procedures Wound #1 Pre-procedure diagnosis of Wound #1 is a Trauma, Other located on the Left,Anterior Lower Leg . There was a Three Layer Compression Therapy Procedure by Dolan Amen, RN. Post procedure Diagnosis Wound #1: Same as Pre-Procedure Notes: pt tolerating wrap well. Plan Follow-up Appointments: Return Appointment in 2 weeks. Home Health: [place]: [facility] to [facility] for [facility] care. May utilize formulary equivalent dressing for wound treatment orders unless otherwise specified. Home Health Nurse may visit PRN to address patient s wound care  needs. Scheduled days for dressing changes to be completed; exception, patient has scheduled wound care visit that day. - Dressing changes Tuesday/Thursday in office/Saturday **Please direct any NON-WOUND related issues/requests for orders to patient's Primary Care Physician. **If current dressing causes regression in wound condition, may D/C ordered dressing product/s and apply Normal Saline Moist Dressing daily until next [place] or Other MD appointment. **Notify Wound Healing Center of regression in wound condition at [phone]. Anesthetic (Use 'Patient Medications' Section for Anesthetic Order Entry): Lidocaine applied to wound bed Benzocaine applied to wound bed. WOUND #1: - Lower Leg Wound Laterality: Left, Anterior Cleanser: Soap and Water 3 x Per Week/30 Days Discharge Instructions: Gently cleanse wound with antibacterial soap, rinse and pat dry prior to dressing wounds Primary Dressing: Hydrofera Blue Ready Transfer Foam, 2.5x2.5 (in/in) 3 x Per Week/30 Days Discharge Instructions: Apply Hydrofera Blue Ready to wound bed as directed Secondary Dressing: ABD Pad 5x9 (in/in) 3 x Per Week/30 Days Discharge Instructions: Cover with ABD pad Compression Wrap: Profore Lite LF 3 Multilayer Compression Bandaging System 3 x Per Week/30 Days Discharge Instructions: Apply 3 multi-layer wrap as prescribed. Allbright, Margues L. ([id number]) 1. Would recommend currently that we going to continue with the wound care measures as before with the [facility] dressing as I feel like that is doing a great job for him. 2. I am also going to recommend that we have the patient continue with the compression wrap. This is a 3 layer compression wrap which is doing an excellent job. We will see patient back for reevaluation in 2 weeks here in the  clinic. If anything worsens or changes patient will contact our office for additional recommendations. Electronic Signature(s) Signed: 08/29/2020  5:27:27 PM By: Worthy Keeler PA-C Entered By: Worthy Keeler on 08/29/2020 17:27:26 Ouzts, [name] ([id number]) -------------------------------------------------------------------------------- SuperBill Details Patient Name: [name], [name] Date of Service: 08/29/2020 Medical Record Number: [id number] Patient Account Number: [id number] Date of Birth/Sex: 08-28-1960 (60 y.o. M) Treating RN: Dolan Amen Primary Care Provider: Dorisann Frames Other Clinician: Jeanine Luz Referring Provider: Dorisann Frames Treating Provider/Extender: [name] in Treatment: 8 Diagnosis Coding ICD-10 Codes Code Description [id number] Unspecified open wound, left lower leg, initial encounter 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 layer exposed N18.6 End stage renal disease Z99.2 Dependence on renal dialysis F20.89 Other schizophrenia Facility Procedures CPT4 Code: [id number] Description: (Facility Use Only) [phone] - [place] LWR LT LEG Modifier: Quantity: 1 Physician Procedures CPT4 Code: [id number] Description: [id number] - WC PHYS LEVEL 3 - EST PT Modifier: Quantity: 1 CPT4 Code: Description: ICD-10 Diagnosis Description S81.802A Unspecified open wound, left lower leg, initial encounter 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 lay N18.6 End stage renal disease Z99.2 Dependence on renal dialysis Modifier: er exposed Quantity: Electronic Signature(s) Signed: 08/29/2020 5:27:48 PM By: Worthy Keeler PA-C Previous Signature: 08/29/2020 5:09:32 PM Version By: Georges Mouse, Minus Breeding RN Entered By: Worthy Keeler on 08/29/2020 17:27:48

## 2020-09-12 ENCOUNTER — Other Ambulatory Visit: Payer: Self-pay

## 2020-09-12 ENCOUNTER — Encounter: Payer: Medicaid Other | Attending: Physician Assistant | Admitting: Physician Assistant

## 2020-09-12 DIAGNOSIS — F2089 Other schizophrenia: Secondary | ICD-10-CM | POA: Insufficient documentation

## 2020-09-12 DIAGNOSIS — I120 Hypertensive chronic kidney disease with stage 5 chronic kidney disease or end stage renal disease: Secondary | ICD-10-CM | POA: Diagnosis not present

## 2020-09-12 DIAGNOSIS — N186 End stage renal disease: Secondary | ICD-10-CM | POA: Insufficient documentation

## 2020-09-12 DIAGNOSIS — Z992 Dependence on renal dialysis: Secondary | ICD-10-CM | POA: Diagnosis not present

## 2020-09-12 DIAGNOSIS — F17200 Nicotine dependence, unspecified, uncomplicated: Secondary | ICD-10-CM | POA: Diagnosis not present

## 2020-09-12 DIAGNOSIS — L97822 Non-pressure chronic ulcer of other part of left lower leg with fat layer exposed: Secondary | ICD-10-CM | POA: Insufficient documentation

## 2020-09-12 NOTE — Progress Notes (Addendum)
Hilburn, Micajah L. ([id number]) Visit Report for 09/12/2020 Chief Complaint Document Details Patient Name: Todd Brown, [name] Date of Service: 09/12/2020 12:30 PM Medical Record Number: [id number] Patient Account Number: [id number] Date of Birth/Sex: [birth year] (60 y.o. M) Treating RN: Dolan Amen Primary Care Provider: Dorisann Frames Other Clinician: Jeanine Luz Referring Provider: Dorisann Frames Treating Provider/Extender: [name] in Treatment: 10 Information Obtained from: Patient Chief Complaint Left LE Traumatic Ulcer Electronic Signature(s) Signed: 09/12/2020 12:52:18 PM By: Worthy Keeler PA-C Entered By: Worthy Keeler on 09/12/2020 12:52:18 Todd Brown, [name] ([id number]) -------------------------------------------------------------------------------- HPI Details Patient Name: Todd Brown, [name] Date of Service: 09/12/2020 12:30 PM Medical Record Number: [id number] Patient Account Number: [id number] Date of Birth/Sex: 1960/09/23 (60 y.o. M) Treating RN: Dolan Amen Primary Care Provider: Dorisann Frames Other Clinician: Jeanine Luz Referring Provider: Dorisann Frames Treating Provider/Extender: [name] in Treatment: 10 History of Present Illness HPI Description: 07/04/2020 patient presents today for initial evaluation here in our clinic. He is actually a ward of the state. With that being said he is only been in the group home that he is currently staying in for the past few weeks in fact it was only about 2 weeks. He was seen in the ED for evaluation on 06/18/2020 due to an injury that he sustained to his left lower leg. He tells me that he got up in the middle the night to go to the bathroom or something and tripped over a wheelchair causing a significant skin tear/laceration to the leg. Unfortunately this was sutured but has dehisced in some areas and appears to have a significant hematoma and others. The proximal portion of the wound has  actually necrosis and the skin flap is can have to be removed. The inferior portion appears to still be healthy and has reattached to some degree along the suture line this is still somewhat dehiscing and is can require some sharp debridement. Fortunately there does not appear to be any signs of active infection at this time. With that being said I do believe that we may want to put him back on some preventative antibiotics, Keflex, just to ensure following the debridement today that he is going to be infection free. Released to the best of our ability. The patient does have a history of end-stage renal disease that he is on dialysis. He also has schizophrenia at this point is now a ward of the state as it was deemed that he was not competent to be able to completely care for himself. Apparently his living condition prior to going to the group home was very poor. With that being said he seems to be doing much better now in my opinion and I do feel like that the location is that is a very good facility. The patient is going require some sharp debridement today and we did talk to [name] who is at the [place] to gain approval for the patient. This was for debridement in particular. We did gain that approval and it was myself in [place] who witnessed this. Upon evaluation today patient appears to be doing a little better in regard to his wound currently. Fortunately there is no signs of active infection at this time. No fevers, chills, nausea, vomiting, or diarrhea. He has been taking the Keflex without complication. With that being said I am concerned today that he did develop a blood clot under the skin flap that skin to prevent him healing. I think we need to  do something to try to prevent this from continuing to be an issue. For that reason I discussed with him the possibility of doing a Unna boot wrap which I think it was going to be the best thing for him as far as  healing is concerned he is in agreement with the plan. 07/18/2020. Upon evaluation today patient's wound bed actually showed signs of improvement compared to previous evaluation. I feel like he is granulating in and does seem to be healing quite nicely. Fortunately there is no evidence of active infection which is great news and overall I think that he is headed in the right direction. 07/25/2020 upon evaluation today patient's wound actually appears to show signs of good improvement. I am actually very pleased with where we stand today. He is actually showing some signs of hypergranulation and I think he could benefit from switching to [facility]. That was discussed with the patient to let them know what was going on today as well. 08/01/2020 upon evaluation today patient appears to be doing excellent in regard to his wound on his leg. Fortunately there is no signs of active infection at this time. No fevers, chills, nausea, vomiting, or diarrhea. 08/15/2020 on evaluation today patient appears to be doing well with regard to his leg ulcer. This is showing signs of excellent improvement overall I am extremely pleased with where we stand he is definitely significantly smaller. 08/29/2020 upon evaluation today patient appears to be doing excellent in regard to his wound. Is been tolerating the dressing changes without complication. Fortunately I think that the patient is making excellent progress and in general I am extremely pleased with where things stand today. 09/12/2020 upon evaluation today patient appears to be doing well with regard to his wound. In fact this is showing signs of doing excellent as measuring significantly smaller and seems to overall be making great progress. I am extremely pleased with where we stand today. Overall I do not see any evidence of infection which is great news as well. Electronic Signature(s) Signed: 09/12/2020 5:43:58 PM By: Worthy Keeler PA-C Entered By: Worthy Keeler on 09/12/2020 17:43:58 Todd Brown, [name] ([id number]) -------------------------------------------------------------------------------- Physical Exam Details Patient Name: Todd Brown, [name] Date of Service: 09/12/2020 12:30 PM Medical Record Number: [id number] Patient Account Number: [id number] Date of Birth/Sex: March 21, 1961 (60 y.o. M) Treating RN: Dolan Amen Primary Care Provider: Dorisann Frames Other Clinician: Jeanine Luz Referring Provider: Dorisann Frames Treating Provider/Extender: [name] in Treatment: 74 Constitutional Well-nourished and well-hydrated in no acute distress. Respiratory normal breathing without difficulty. Psychiatric this patient is able to make decisions and demonstrates good insight into disease process. Alert and Oriented x 3. pleasant and cooperative. Notes Patient's wound bed showed signs of good granulation epithelization at this point. There does not appear to be any signs of active infection which is great and overall I am extremely pleased with where we stand today. No fevers, chills, nausea, vomiting, or diarrhea. Electronic Signature(s) Signed: 09/12/2020 5:44:10 PM By: Worthy Keeler PA-C Entered By: Worthy Keeler on 09/12/2020 17:44:10 Todd Brown, [name] ([id number]) -------------------------------------------------------------------------------- Physician Orders Details Patient Name: Todd Brown, [name] Date of Service: 09/12/2020 12:30 PM Medical Record Number: [id number] Patient Account Number: [id number] Date of Birth/Sex: 11/27/1960 (60 y.o. M) Treating RN: Dolan Amen Primary Care Provider: Dorisann Frames Other Clinician: Jeanine Luz Referring Provider: Dorisann Frames Treating Provider/Extender: [name] in Treatment: 10 Verbal / Phone Orders: No Diagnosis Coding ICD-10 Coding Code Description [id number] Unspecified open  wound, left lower leg, initial encounter L97.822 Non-pressure  chronic ulcer of other part of left lower leg with fat layer exposed N18.6 End stage renal disease Z99.2 Dependence on renal dialysis F20.89 Other schizophrenia Follow-up Appointments o Return Appointment in 2 weeks. [place]: [facility] o ADMIT to Home Health for wound care. May utilize formulary equivalent dressing for wound treatment orders unless otherwise specified. Home Health Nurse may visit PRN to address patientos wound care needs. o Scheduled days for dressing changes to be completed; exception, patient has scheduled wound care visit that day. - Dressing changes Tuesday/Thursday in office when he has appt/Saturday o **Please direct any NON-WOUND related issues/requests for orders to patient's Primary Care Physician. **If current dressing causes regression in wound condition, may D/C ordered dressing product/s and apply Normal Saline Moist Dressing daily until next [place] or Other MD appointment. **Notify [facility]aling Center of regression in wound condition at [phone]. Anesthetic (Use 'Patient Medications' Section for Anesthetic Order Entry) o Lidocaine applied to wound bed o Benzocaine applied to wound bed. Wound Treatment Wound #1 - Lower Leg Wound Laterality: Left, Anterior Cleanser: Soap and Water 3 x Per Week/30 Days Discharge Instructions: Gently cleanse wound with antibacterial soap, rinse and pat dry prior to dressing wounds Primary Dressing: Hydrofera Blue Ready Transfer Foam, 2.5x2.5 (in/in) 3 x Per Week/30 Days Discharge Instructions: Apply Hydrofera Blue Ready to wound bed as directed Secondary Dressing: ABD Pad 5x9 (in/in) 3 x Per Week/30 Days Discharge Instructions: Cover with ABD pad Compression Wrap: Profore Lite LF 3 Multilayer Compression Bandaging System 3 x Per Week/30 Days Discharge Instructions: Apply 3 multi-layer wrap as prescribed. Electronic Signature(s) Signed: 09/12/2020 5:28:26 PM By: Georges Mouse, Minus Breeding RN Signed: 09/12/2020 5:45:27 PM By: Worthy Keeler PA-C Entered By: Georges Mouse, Minus Breeding on 09/12/2020 13:25:49 Todd Brown, [name] ([id number]) -------------------------------------------------------------------------------- Problem List Details Patient Name: Todd Brown, [name] Date of Service: 09/12/2020 12:30 PM Medical Record Number: [id number] Patient Account Number: [id number] Date of Birth/Sex: 08-Sep-1960 (59 y.o. M) Treating RN: Dolan Amen Primary Care Provider: Dorisann Frames Other Clinician: Jeanine Luz Referring Provider: Dorisann Frames Treating Provider/Extender: [name] in Treatment: 10 Active Problems ICD-10 Encounter Code Description Active Date MDM Diagnosis S81.802A Unspecified open wound, left lower leg, initial encounter 07/04/2020 No Yes 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 layer 07/04/2020 No Yes exposed N18.6 End stage renal disease 07/04/2020 No Yes Z99.2 Dependence on renal dialysis 07/04/2020 No Yes F20.89 Other schizophrenia 07/04/2020 No Yes Inactive Problems Resolved Problems Electronic Signature(s) Signed: 09/12/2020 12:52:09 PM By: Worthy Keeler PA-C Entered By: Worthy Keeler on 09/12/2020 12:52:09 Todd Brown, [name] ([id number]) -------------------------------------------------------------------------------- Progress Note Details Patient Name: Todd Brown, [name] Date of Service: 09/12/2020 12:30 PM Medical Record Number: [id number] Patient Account Number: [id number] Date of Birth/Sex: [birth year] (60 y.o. M) Treating RN: Dolan Amen Primary Care Provider: Dorisann Frames Other Clinician: Jeanine Luz Referring Provider: Dorisann Frames Treating Provider/Extender: [name] in Treatment: 10 Subjective Chief Complaint Information obtained from Patient Left LE Traumatic Ulcer History of Present Illness (HPI) 07/04/2020 patient presents today for initial evaluation  here in our clinic. He is actually a ward of the state. With that being said he is only been in the group home that he is currently staying in for the past few weeks in fact it was only about 2 weeks. He was seen in the ED for evaluation on 06/18/2020 due to an injury  that he sustained to his left lower leg. He tells me that he got up in the middle the night to go to the bathroom or something and tripped over a wheelchair causing a significant skin tear/laceration to the leg. Unfortunately this was sutured but has dehisced in some areas and appears to have a significant hematoma and others. The proximal portion of the wound has actually necrosis and the skin flap is can have to be removed. The inferior portion appears to still be healthy and has reattached to some degree along the suture line this is still somewhat dehiscing and is can require some sharp debridement. Fortunately there does not appear to be any signs of active infection at this time. With that being said I do believe that we may want to put him back on some preventative antibiotics, Keflex, just to ensure following the debridement today that he is going to be infection free. Released to the best of our ability. The patient does have a history of end-stage renal disease that he is on dialysis. He also has schizophrenia at this point is now a ward of the state as it was deemed that he was not competent to be able to completely care for himself. Apparently his living condition prior to going to the group home was very poor. With that being said he seems to be doing much better now in my opinion and I do feel like that the location is that is a very good facility. The patient is going require some sharp debridement today and we did talk to [name] who is at the [place] to gain approval for the patient. This was for debridement in particular. We did gain that approval and it was myself in [place] who witnessed  this. Upon evaluation today patient appears to be doing a little better in regard to his wound currently. Fortunately there is no signs of active infection at this time. No fevers, chills, nausea, vomiting, or diarrhea. He has been taking the Keflex without complication. With that being said I am concerned today that he did develop a blood clot under the skin flap that skin to prevent him healing. I think we need to do something to try to prevent this from continuing to be an issue. For that reason I discussed with him the possibility of doing a Unna boot wrap which I think it was going to be the best thing for him as far as healing is concerned he is in agreement with the plan. 07/18/2020. Upon evaluation today patient's wound bed actually showed signs of improvement compared to previous evaluation. I feel like he is granulating in and does seem to be healing quite nicely. Fortunately there is no evidence of active infection which is great news and overall I think that he is headed in the right direction. 07/25/2020 upon evaluation today patient's wound actually appears to show signs of good improvement. I am actually very pleased with where we stand today. He is actually showing some signs of hypergranulation and I think he could benefit from switching to [facility]. That was discussed with the patient to let them know what was going on today as well. 08/01/2020 upon evaluation today patient appears to be doing excellent in regard to his wound on his leg. Fortunately there is no signs of active infection at this time. No fevers, chills, nausea, vomiting, or diarrhea. 08/15/2020 on evaluation today patient appears to be doing well with regard to his leg  ulcer. This is showing signs of excellent improvement overall I am extremely pleased with where we stand he is definitely significantly smaller. 08/29/2020 upon evaluation today patient appears to be doing excellent in regard to his wound. Is been  tolerating the dressing changes without complication. Fortunately I think that the patient is making excellent progress and in general I am extremely pleased with where things stand today. 09/12/2020 upon evaluation today patient appears to be doing well with regard to his wound. In fact this is showing signs of doing excellent as measuring significantly smaller and seems to overall be making great progress. I am extremely pleased with where we stand today. Overall I do not see any evidence of infection which is great news as well. Objective Constitutional Well-nourished and well-hydrated in no acute distress. Vitals Time Taken: 12:43 PM, Height: 75 in, Weight: 190 lbs, BMI: 23.7, Temperature: 98.7 F, Pulse: 84 bpm, Respiratory Rate: 18 breaths/min, Blood Pressure: 108/67 mmHg. Todd Brown, [name] ([id number]) Respiratory normal breathing without difficulty. Psychiatric this patient is able to make decisions and demonstrates good insight into disease process. Alert and Oriented x 3. pleasant and cooperative. General Notes: Patient's wound bed showed signs of good granulation epithelization at this point. There does not appear to be any signs of active infection which is great and overall I am extremely pleased with where we stand today. No fevers, chills, nausea, vomiting, or diarrhea. Integumentary (Hair, Skin) Wound #1 status is Open. Original cause of wound was Laceration. The date acquired was: 06/18/2020. The wound has been in treatment 10 weeks. The wound is located on the Left,Anterior Lower Leg. The wound measures 1.2cm length x 1cm width x 0.1cm depth; 0.942cm^2 area and 0.094cm^3 volume. There is Fat Layer (Subcutaneous Tissue) exposed. There is no tunneling or undermining noted. There is a medium amount of serous drainage noted. The wound margin is flat and intact. There is large (67-100%) red, friable, hyper - granulation within the wound bed. There is no necrotic tissue within the  wound bed. Assessment Active Problems ICD-10 Unspecified open wound, left lower leg, initial encounter Non-pressure chronic ulcer of other part of left lower leg with fat layer exposed End stage renal disease Dependence on renal dialysis Other schizophrenia Procedures Wound #1 Pre-procedure diagnosis of Wound #1 is a Trauma, Other located on the Left,Anterior Lower Leg . There was a Three Layer Compression Therapy Procedure by Dolan Amen, RN. Post procedure Diagnosis Wound #1: Same as Pre-Procedure Notes: pt tolerating wrap well. Plan Follow-up Appointments: Return Appointment in 2 weeks. Home Health: [place]: [facility] to [facility] for wound care. May utilize formulary equivalent dressing for wound treatment orders unless otherwise specified. Home Health Nurse may visit PRN to address patient s wound care needs. Scheduled days for dressing changes to be completed; exception, patient has scheduled wound care visit that day. - Dressing changes Tuesday/Thursday in office when he has appt/Saturday **Please direct any NON-WOUND related issues/requests for orders to patient's Primary Care Physician. **If current dressing causes regression in wound condition, may D/C ordered dressing product/s and apply Normal Saline Moist Dressing daily until next [address] or Other MD appointment. **Notify W[facility]aling Center of regression in wound condition at [phone]. Anesthetic (Use 'Patient Medications' Section for Anesthetic Order Entry): Lidocaine applied to wound bed Benzocaine applied to wound bed. WOUND #1: - Lower Leg Wound Laterality: Left, Anterior Cleanser: Soap and Water 3 x Per Week/30 Days Discharge Instructions: Gently cleanse wound with antibacterial soap, rinse  and pat dry prior to dressing wounds Primary Dressing: Hydrofera Blue Ready Transfer Foam, 2.5x2.5 (in/in) 3 x Per Week/30 Days Discharge Instructions: Apply Hydrofera Blue Ready to  wound bed as directed Secondary Dressing: ABD Pad 5x9 (in/in) 3 x Per Week/30 Days Discharge Instructions: Cover with ABD pad Compression Wrap: Profore Lite LF 3 Multilayer Compression Bandaging System 3 x Per Week/30 Days Discharge Instructions: Apply 3 multi-layer wrap as prescribed. Todd Brown, [name] ([id number]) 1. Would recommend currently that we going continue with the wound care measures as before. We are using the [facility] which I think is doing a great job. 2. We will also continue with 3 layer compression wrap. 3. I am also can recommend that we have the patient continue to elevate his legs much as possible to help with edema control. We will see patient back for reevaluation in 1 week here in the clinic. If anything worsens or changes patient will contact our office for additional recommendations. Electronic Signature(s) Signed: 09/12/2020 5:44:43 PM By: Worthy Keeler PA-C Entered By: Worthy Keeler on 09/12/2020 17:44:43 Garinger, Roverto L. ([id number]) -------------------------------------------------------------------------------- SuperBill Details Patient Name: Mackowski, Agamjot L. Date of Service: 09/12/2020 Medical Record Number: [id number] Patient Account Number: [id number] Date of Birth/Sex: 04/13/1961 (60 y.o. M) Treating RN: Dolan Amen Primary Care Provider: Dorisann Frames Other Clinician: Jeanine Luz Referring Provider: Dorisann Frames Treating Provider/Extender: [name] in Treatment: 10 Diagnosis Coding ICD-10 Codes Code Description [phone] Unspecified open wound, left lower leg, initial encounter 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 layer exposed N18.6 End stage renal disease Z99.2 Dependence on renal dialysis F20.89 Other schizophrenia Facility Procedures CPT4 Code: [id number] Description: (Facility Use Only) [phone] - [facility] LWR LT LEG Modifier: Quantity: 1 Physician  Procedures CPT4 Code: [id number] Description: [id number] - WC PHYS LEVEL 3 - EST PT Modifier: Quantity: 1 CPT4 Code: Description: ICD-10 Diagnosis Description S81.802A Unspecified open wound, left lower leg, initial encounter 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 lay N18.6 End stage renal disease Z99.2 Dependence on renal dialysis Modifier: er exposed Quantity: Electronic Signature(s) Signed: 09/12/2020 5:44:57 PM By: Worthy Keeler PA-C Previous Signature: 09/12/2020 5:28:26 PM Version By: Georges Mouse, Minus Breeding RN Entered By: Worthy Keeler on 09/12/2020 17:44:57

## 2020-09-13 NOTE — Progress Notes (Signed)
Fannin, Dexter L. ([id number]) Visit Report for 09/12/2020 Arrival Information Details Patient Name: MIRAMON, Jshawn L. Date of Service: 09/12/2020 12:30 PM Medical Record Number: [id number] Patient Account Number: [id number] Date of Birth/Sex: Apr 29, 1961 (60 y.o. M) Treating RN: Donnamarie Poag Primary Care Emryn Flanery: Dorisann Frames Other Clinician: Jeanine Luz Referring Jonasia Coiner: Dorisann Frames Treating Lamonica Trueba/Extender: [name] in Treatment: 10 Visit Information History Since Last Visit Added or deleted any medications: No Patient Arrived: Wheel Chair Had a fall or experienced change in No Arrival Time: 12:37 activities of daily living that may affect Accompanied By: caregiver risk of falls: Transfer Assistance: None Hospitalized since last visit: No Patient Identification Verified: Yes Has Dressing in Place as Prescribed: Yes Secondary Verification Process Completed: Yes Pain Present Now: No Patient Requires Transmission-Based No Precautions: Patient Has Alerts: Yes Patient Alerts: 07/25/2020 ABI left [place]>220 B/P in RIGHT ARM ONLY DSS/signatures Electronic Signature(s) Signed: 09/12/2020 5:20:48 PM By: Donnamarie Poag Entered ByDonnamarie Poag on 09/12/2020 12:47:10 Mccowan, Amaree L. ([id number]) -------------------------------------------------------------------------------- Clinic Level of Care Assessment Details Patient Name: Rotondo, Airik L. Date of Service: 09/12/2020 12:30 PM Medical Record Number: [id number] Patient Account Number: [id number] Date of Birth/Sex: 1960/06/03 (60 y.o. M) Treating RN: Dolan Amen Primary Care Arias Weinert: Dorisann Frames Other Clinician: Jeanine Luz Referring Kathline Banbury: Dorisann Frames Treating Janequa Kipnis/Extender: [name] in Treatment: 10 Clinic Level of Care Assessment Items TOOL 1 Quantity Score '[]'$  - Use when EandM and Procedure is performed on INITIAL visit 0 ASSESSMENTS - Nursing Assessment /  Reassessment '[]'$  - General Physical Exam (combine w/ comprehensive assessment (listed just below) when performed on new 0 pt. evals) '[]'$  - 0 Comprehensive Assessment (HX, ROS, Risk Assessments, Wounds Hx, etc.) ASSESSMENTS - Wound and Skin Assessment / Reassessment '[]'$  - Dermatologic / Skin Assessment (not related to wound area) 0 ASSESSMENTS - Ostomy and/or Continence Assessment and Care '[]'$  - Incontinence Assessment and Management 0 '[]'$  - 0 Ostomy Care Assessment and Management (repouching, etc.) PROCESS - Coordination of Care '[]'$  - Simple Patient / Family Education for ongoing care 0 '[]'$  - 0 Complex (extensive) Patient / Family Education for ongoing care '[]'$  - 0 Staff obtains Programmer, systems, Records, Test Results / Process Orders '[]'$  - 0 Staff telephones HHA, Nursing Homes / Clarify orders / etc '[]'$  - 0 Routine Transfer to another Facility (non-emergent condition) '[]'$  - 0 Routine Hospital Admission (non-emergent condition) '[]'$  - 0 New Admissions / Biomedical engineer / Ordering NPWT, Apligraf, etc. '[]'$  - 0 Emergency Hospital Admission (emergent condition) PROCESS - Special Needs '[]'$  - Pediatric / Minor Patient Management 0 '[]'$  - 0 Isolation Patient Management '[]'$  - 0 Hearing / Language / Visual special needs '[]'$  - 0 Assessment of Community assistance (transportation, D/C planning, etc.) '[]'$  - 0 Additional assistance / Altered mentation '[]'$  - 0 Support Surface(s) Assessment (bed, cushion, seat, etc.) INTERVENTIONS - Miscellaneous '[]'$  - External ear exam 0 '[]'$  - 0 Patient Transfer (multiple staff / Civil Service fast streamer / Similar devices) '[]'$  - 0 Simple Staple / Suture removal (25 or less) '[]'$  - 0 Complex Staple / Suture removal (26 or more) '[]'$  - 0 Hypo/Hyperglycemic Management (do not check if billed separately) '[]'$  - 0 Ankle / Brachial Index (ABI) - do not check if billed separately Has the patient been seen at the hospital within the last three years: Yes Total Score: 0 Level Of Care:  ____ Costen, Harol L. ([id number]) Electronic Signature(s) Signed: 09/12/2020 5:28:26 PM By: Georges Mouse, Minus Breeding RN Entered By: Georges Mouse, Minus Breeding on 09/12/2020 13:25:55 Sikkema,  Adhvik L. ([id number]) -------------------------------------------------------------------------------- Compression Therapy Details Patient Name: Babic, Dontray L. Date of Service: 09/12/2020 12:30 PM Medical Record Number: [id number] Patient Account Number: [id number] Date of Birth/Sex: April 06, 1961 (60 y.o. M) Treating RN: Dolan Amen Primary Care Earmon Sherrow: Dorisann Frames Other Clinician: Jeanine Luz Referring Mcgwire Dasaro: Dorisann Frames Treating Veronique Warga/Extender: [name] in Treatment: 10 Compression Therapy Performed for Wound Assessment: Wound #1 Left,Anterior Lower Leg Performed By: Clinician Dolan Amen, RN Compression Type: Three Layer Post Procedure Diagnosis Same as Pre-procedure Notes pt tolerating wrap well Electronic Signature(s) Signed: 09/12/2020 5:28:26 PM By: Georges Mouse, Minus Breeding RN Entered By: Georges Mouse, Kenia on 09/12/2020 13:25:02 Hendryx, Bransyn L. ([id number]) -------------------------------------------------------------------------------- Encounter Discharge Information Details Patient Name: Lafferty, Jayvien L. Date of Service: 09/12/2020 12:30 PM Medical Record Number: [id number] Patient Account Number: [id number] Date of Birth/Sex: [birth year] (60 y.o. M) Treating RN: Donnamarie Poag Primary Care Keryl Gholson: Dorisann Frames Other Clinician: Jeanine Luz Referring Bertin Inabinet: Dorisann Frames Treating Selwyn Reason/Extender: [name] in Treatment: 10 Encounter Discharge Information Items Discharge Condition: Stable Ambulatory Status: Wheelchair Discharge Destination: Skilled Nursing Facility Telephoned: No Orders Sent: Yes Transportation: Other Accompanied By: caregiver Schedule Follow-up Appointment: Yes Clinical Summary of Care: Electronic  Signature(s) Signed: 09/12/2020 5:20:48 PM By: Donnamarie Poag Entered ByDonnamarie Poag on 09/12/2020 13:35:55 Renfroe, Brahim L. ([id number]) -------------------------------------------------------------------------------- Lower Extremity Assessment Details Patient Name: Matney, Bertie L. Date of Service: 09/12/2020 12:30 PM Medical Record Number: [id number] Patient Account Number: [id number] Date of Birth/Sex: [birth year] (60 y.o. M) Treating RN: Donnamarie Poag Primary Care Kebin Maye: Dorisann Frames Other Clinician: Jeanine Luz Referring Jenene Kauffmann: Dorisann Frames Treating Jae Skeet/Extender: [name] in Treatment: 10 Edema Assessment Assessed: Shirlyn Goltz: Yes] Patrice Paradise: No] [Left: Edema] [Right: :] Calf Left: Right: Point of Measurement: 39 cm From Medial Instep 33 cm Ankle Left: Right: Point of Measurement: 10 cm From Medial Instep 22 cm Knee To Floor Left: Right: From Medial Instep 46 cm Vascular Assessment Pulses: Dorsalis Pedis Palpable: [Left:Yes] Electronic Signature(s) Signed: 09/12/2020 5:20:48 PM By: Donnamarie Poag Entered ByDonnamarie Poag on 09/12/2020 12:54:27 Deltoro, Gwen L. ([id number]) -------------------------------------------------------------------------------- Multi Wound Chart Details Patient Name: Kochan, Kirubel L. Date of Service: 09/12/2020 12:30 PM Medical Record Number: [id number] Patient Account Number: [id number] Date of Birth/Sex: 1960-12-09 (60 y.o. M) Treating RN: Dolan Amen Primary Care Nikelle Malatesta: Dorisann Frames Other Clinician: Jeanine Luz Referring Ashaunte Standley: Dorisann Frames Treating Rheana Casebolt/Extender: [name] in Treatment: 10 Vital Signs Height(in): 75 Pulse(bpm): 13 Weight(lbs): 190 Blood Pressure(mmHg): 108/67 Body Mass Index(BMI): 24 Temperature(F): 98.7 Respiratory Rate(breaths/min): 18 Photos: [N/A:N/A] Wound Location: Left, Anterior Lower Leg N/A N/A Wounding Event: Laceration N/A N/A Primary Etiology:  Trauma, Other N/A N/A Comorbid History: Chronic Obstructive Pulmonary N/A N/A Disease (COPD), Hypertension Date Acquired: 06/18/2020 N/A N/A Weeks of Treatment: 10 N/A N/A Wound Status: Open N/A N/A Measurements L x W x D (cm) 1.2x1x0.1 N/A N/A Area (cm) : 0.942 N/A N/A Volume (cm) : 0.094 N/A N/A % Reduction in Area: 98.30% N/A N/A % Reduction in Volume: 99.60% N/A N/A Classification: Full Thickness Without Exposed N/A N/A Support Structures Exudate Amount: Medium N/A N/A Exudate Type: Serous N/A N/A Exudate Color: amber N/A N/A Wound Margin: Flat and Intact N/A N/A Granulation Amount: Large (67-100%) N/A N/A Granulation Quality: Red, Hyper-granulation, Friable N/A N/A Necrotic Amount: None Present (0%) N/A N/A Exposed Structures: Fat Layer (Subcutaneous Tissue): N/A N/A Yes Fascia: No Tendon: No Muscle: No Joint: No Bone: No Epithelialization: Small (1-33%) N/A N/A Treatment Notes Electronic Signature(s) Signed: 09/12/2020 5:28:26 PM By: Georges Mouse, Minus Breeding  RN Entered By: Georges Mouse, Minus Breeding on 09/12/2020 13:24:22 Humble, Nina L. ([id number]) -------------------------------------------------------------------------------- Multi-Disciplinary Care Plan Details Patient Name: Pavlock, Rui L. Date of Service: 09/12/2020 12:30 PM Medical Record Number: [id number] Patient Account Number: [id number] Date of Birth/Sex: [birth year] (59 y.o. M) Treating RN: Dolan Amen Primary Care Lydell Moga: Dorisann Frames Other Clinician: Jeanine Luz Referring Dowell Hoon: Dorisann Frames Treating Zera Markwardt/Extender: [name] in Treatment: 10 Active Inactive Electronic Signature(s) Signed: 09/12/2020 5:28:26 PM By: Georges Mouse, Minus Breeding RN Entered By: Georges Mouse, Minus Breeding on 09/12/2020 13:24:15 Haymore, Telly L. ([id number]) -------------------------------------------------------------------------------- Pain Assessment Details Patient Name: Kuna, Syaire  L. Date of Service: 09/12/2020 12:30 PM Medical Record Number: [id number] Patient Account Number: [id number] Date of Birth/Sex: [birth year] (60 y.o. M) Treating RN: Donnamarie Poag Primary Care Shalan Neault: Dorisann Frames Other Clinician: Jeanine Luz Referring Hermon Zea: Dorisann Frames Treating Nawaf Strange/Extender: [name] in Treatment: 10 Active Problems Location of Pain Severity and Description of Pain Patient Has Paino No Site Locations Rate the pain. Current Pain Level: 1 Pain Management and Medication Current Pain Management: Electronic Signature(s) Signed: 09/12/2020 5:20:48 PM By: Donnamarie Poag Entered By: Donnamarie Poag on 09/12/2020 12:46:25 Cortinas, Tecumseh L. ([id number]) -------------------------------------------------------------------------------- Patient/Caregiver Education Details Patient Name: Trnka, Syris L. Date of Service: 09/12/2020 12:30 PM Medical Record Number: [id number] Patient Account Number: [id number] Date of Birth/Gender: 16-May-1961 (60 y.o. M) Treating RN: Dolan Amen Primary Care Physician: Dorisann Frames Other Clinician: Jeanine Luz Referring Physician: Dorisann Frames Treating Physician/Extender: [name] in Treatment: 10 Education Assessment Education Provided To: Patient Education Topics Provided Wound/Skin Impairment: Methods: Explain/Verbal Responses: State content correctly Electronic Signature(s) Signed: 09/12/2020 5:28:26 PM By: Georges Mouse, Minus Breeding RN Entered By: Georges Mouse, Kenia on 09/12/2020 13:26:12 Pavlock, Antwian L. ([id number]) -------------------------------------------------------------------------------- Wound Assessment Details Patient Name: Howell, Andersen L. Date of Service: 09/12/2020 12:30 PM Medical Record Number: [id number] Patient Account Number: [id number] Date of Birth/Sex: 08-Jun-1960 (60 y.o. M) Treating RN: Donnamarie Poag Primary Care Gennie Dib: Dorisann Frames Other Clinician:  Jeanine Luz Referring Tiearra Colwell: Dorisann Frames Treating Kona Lover/Extender: [name] in Treatment: 10 Wound Status Wound Number: 1 Primary Trauma, Other Etiology: Wound Location: Left, Anterior Lower Leg Wound Status: Open Wounding Event: Laceration Comorbid Chronic Obstructive Pulmonary Disease (COPD), Date Acquired: 06/18/2020 History: Hypertension Weeks Of Treatment: 10 Clustered Wound: No Photos Wound Measurements Length: (cm) 1.2 Width: (cm) 1 Depth: (cm) 0.1 Area: (cm) 0.942 Volume: (cm) 0.094 % Reduction in Area: 98.3% % Reduction in Volume: 99.6% Epithelialization: Small (1-33%) Tunneling: No Undermining: No Wound Description Classification: Full Thickness Without Exposed Support Structu Wound Margin: Flat and Intact Exudate Amount: Medium Exudate Type: Serous Exudate Color: amber res Foul Odor After Cleansing: No Slough/Fibrino No Wound Bed Granulation Amount: Large (67-100%) Exposed Structure Granulation Quality: Red, Hyper-granulation, Friable Fascia Exposed: No Necrotic Amount: None Present (0%) Fat Layer (Subcutaneous Tissue) Exposed: Yes Tendon Exposed: No Muscle Exposed: No Joint Exposed: No Bone Exposed: No Treatment Notes Wound #1 (Lower Leg) Wound Laterality: Left, Anterior Cleanser Soap and Water Discharge Instruction: Gently cleanse wound with antibacterial soap, rinse and pat dry prior to dressing wounds Peri-Wound Care Haberman, Aubrey L. ([id number]) Topical Primary Dressing Hydrofera Blue Ready Transfer Foam, 2.5x2.5 (in/in) Discharge Instruction: Apply Hydrofera Blue Ready to wound bed as directed Secondary Dressing ABD Pad 5x9 (in/in) Discharge Instruction: Cover with ABD pad Secured With Compression Wrap Profore Lite LF 3 Multilayer Compression [place] Discharge Instruction: Apply 3 multi-layer wrap as prescribed. Compression Stockings Add-Ons Electronic Signature(s) Signed: 09/12/2020 5:20:48 PM By:  Donnamarie Poag Entered By: Lyndel Safe,  Joy on 09/12/2020 12:53:02 Eisenstein, Welden L. ([id number]) -------------------------------------------------------------------------------- Vitals Details Patient Name: Bonsell, Frutoso L. Date of Service: 09/12/2020 12:30 PM Medical Record Number: [id number] Patient Account Number: [id number] Date of Birth/Sex: Jul 22, 1960 (60 y.o. M) Treating RN: Donnamarie Poag Primary Care Daiki Dicostanzo: Dorisann Frames Other Clinician: Jeanine Luz Referring Huel Centola: Dorisann Frames Treating Rimsha Trembley/Extender: [name] in Treatment: 10 Vital Signs Time Taken: 12:43 Temperature (F): 98.7 Height (in): 75 Pulse (bpm): 84 Weight (lbs): 190 Respiratory Rate (breaths/min): 18 Body Mass Index (BMI): 23.7 Blood Pressure (mmHg): 108/67 Reference Range: 80 - 120 mg / dl Electronic Signature(s) Signed: 09/12/2020 5:20:48 PM By: Donnamarie Poag Entered ByDonnamarie Poag on 09/12/2020 12:45:28

## 2020-09-26 ENCOUNTER — Encounter: Payer: Medicaid Other | Admitting: Internal Medicine

## 2020-09-26 ENCOUNTER — Other Ambulatory Visit: Payer: Self-pay

## 2020-09-26 DIAGNOSIS — L97822 Non-pressure chronic ulcer of other part of left lower leg with fat layer exposed: Secondary | ICD-10-CM | POA: Diagnosis not present

## 2020-09-26 NOTE — Progress Notes (Signed)
Mccall, Noach L. ([id number]) Visit Report for 09/26/2020 HPI Details Patient Name: Kamath, Navon L. Date of Service: 09/26/2020 9:15 AM Medical Record Number: [id number] Patient Account Number: [id number] Date of Birth/Sex: 1961/03/02 (60 y.o. M) Treating RN: Dolan Amen Primary Care Provider: Dorisann Frames Other Clinician: Referring Provider: Dorisann Frames Treating Provider/Extender: Tito Dine in Treatment: 12 History of Present Illness HPI Description: 07/04/2020 patient presents today for initial evaluation here in our clinic. He is actually a ward of the state. With that being said he is only been in the group home that he is currently staying in for the past few weeks in fact it was only about 2 weeks. He was seen in the ED for evaluation on 06/18/2020 due to an injury that he sustained to his left lower leg. He tells me that he got up in the middle the night to go to the bathroom or something and tripped over a wheelchair causing a significant skin tear/laceration to the leg. Unfortunately this was sutured but has dehisced in some areas and appears to have a significant hematoma and others. The proximal portion of the wound has actually necrosis and the skin flap is can have to be removed. The inferior portion appears to still be healthy and has reattached to some degree along the suture line this is still somewhat dehiscing and is can require some sharp debridement. Fortunately there does not appear to be any signs of active infection at this time. With that being said I do believe that we may want to put him back on some preventative antibiotics, Keflex, just to ensure following the debridement today that he is going to be infection free. Released to the best of our ability. The patient does have a history of end-stage renal disease that he is on dialysis. He also has schizophrenia at this point is now a ward of the state as it was deemed that he was not competent to  be able to completely care for himself. Apparently his living condition prior to going to the group home was very poor. With that being said he seems to be doing much better now in my opinion and I do feel like that the location is that is a very good facility. The patient is going require some sharp debridement today and we did talk to [name] who is at the [facility] to gain approval for the patient. This was for debridement in particular. We did gain that approval and it was myself in [place] who witnessed this. Upon evaluation today patient appears to be doing a little better in regard to his wound currently. Fortunately there is no signs of active infection at this time. No fevers, chills, nausea, vomiting, or diarrhea. He has been taking the Keflex without complication. With that being said I am concerned today that he did develop a blood clot under the skin flap that skin to prevent him healing. I think we need to do something to try to prevent this from continuing to be an issue. For that reason I discussed with him the possibility of doing a Unna boot wrap which I think it was going to be the best thing for him as far as healing is concerned he is in agreement with the plan. 07/18/2020. Upon evaluation today patient's wound bed actually showed signs of improvement compared to previous evaluation. I feel like he is granulating in and does seem to be healing quite nicely. Fortunately there is no evidence  of active infection which is great news and overall I think that he is headed in the right direction. 07/25/2020 upon evaluation today patient's wound actually appears to show signs of good improvement. I am actually very pleased with where we stand today. He is actually showing some signs of hypergranulation and I think he could benefit from switching to [facility]. That was discussed with the patient to let them know what was going on today as well. 08/01/2020 upon  evaluation today patient appears to be doing excellent in regard to his wound on his leg. Fortunately there is no signs of active infection at this time. No fevers, chills, nausea, vomiting, or diarrhea. 08/15/2020 on evaluation today patient appears to be doing well with regard to his leg ulcer. This is showing signs of excellent improvement overall I am extremely pleased with where we stand he is definitely significantly smaller. 08/29/2020 upon evaluation today patient appears to be doing excellent in regard to his wound. Is been tolerating the dressing changes without complication. Fortunately I think that the patient is making excellent progress and in general I am extremely pleased with where things stand today. 09/12/2020 upon evaluation today patient appears to be doing well with regard to his wound. In fact this is showing signs of doing excellent as measuring significantly smaller and seems to overall be making great progress. I am extremely pleased with where we stand today. Overall I do not see any evidence of infection which is great news as well. 4/28; 2-week follow-up visit. The patient has home health changing his dressing. This was initially traumatic wound on the left anterior lower leg which was large plus or minus a hematoma. In any case it is now down to a very small healthy looking area in the superior part of the original wound we have been using Hydrofera Blue under compression Electronic Signature(s) Signed: 09/26/2020 5:07:17 PM By: Linton Ham MD Entered By: Linton Ham on 09/26/2020 09:56:06 Kelty, Antjuan L. ([id number]) -------------------------------------------------------------------------------- Physical Exam Details Patient Name: Quirk, Alaa L. Date of Service: 09/26/2020 9:15 AM Medical Record Number: [id number] Patient Account Number: [id number] Date of Birth/Sex: 1960/10/19 (60 y.o. M) Treating RN: Dolan Amen Primary Care Provider: Dorisann Frames Other Clinician: Referring Provider: Dorisann Frames Treating Provider/Extender: Tito Dine in Treatment: 12 Constitutional Sitting or standing Blood Pressure is within target range for patient.. Pulse regular and within target range for patient.Marland Kitchen Respirations regular, non- labored and within target range.. Temperature is normal and within the target range for the patient.Marland Kitchen appears in no distress. Cardiovascular Pedal pulses are palpable. Excellent edema control. Notes Wound exam; left anterior lower leg mid tibia. The wound itself is really very healthy. Nice healthy granulation tissue surrounded by rims of epithelialization. The rest of the patient's original substantial wound has remained closed. Electronic Signature(s) Signed: 09/26/2020 5:07:17 PM By: Linton Ham MD Entered By: Linton Ham on 09/26/2020 09:57:15 Heck, Antonios L. ([id number]) -------------------------------------------------------------------------------- Physician Orders Details Patient Name: Knerr, Maxemiliano L. Date of Service: 09/26/2020 9:15 AM Medical Record Number: [id number] Patient Account Number: [id number] Date of Birth/Sex: [birth year] (60 y.o. M) Treating RN: Dolan Amen Primary Care Provider: Dorisann Frames Other Clinician: Referring Provider: Dorisann Frames Treating Provider/Extender: Tito Dine in Treatment: 12 Verbal / Phone Orders: No Diagnosis Coding Follow-up Appointments o Return Appointment in 2 weeks. [place]: - [facility] o ADMIT to Home Health for wound care. May utilize formulary equivalent dressing for wound treatment orders unless otherwise specified.  Home Health Nurse may visit PRN to address patientos wound care needs. o Scheduled days for dressing changes to be completed; exception, patient has scheduled wound care visit that day. - Dressing changes Tuesday/Thursday in office when he has appt/Saturday o  **Please direct any NON-WOUND related issues/requests for orders to patient's Primary Care Physician. **If current dressing causes regression in wound condition, may D/C ordered dressing product/s and apply Normal Saline Moist Dressing daily until next [place] or Other MD appointment. **Notify W[facility] Healing Center of regression in wound condition at [phone]. Anesthetic (Use 'Patient Medications' Section for Anesthetic Order Entry) o Lidocaine applied to wound bed o Benzocaine applied to wound bed. Wound Treatment Wound #1 - Lower Leg Wound Laterality: Left, Anterior Cleanser: Soap and Water 3 x Per Week/30 Days Discharge Instructions: Gently cleanse wound with antibacterial soap, rinse and pat dry prior to dressing wounds Primary Dressing: Hydrofera Blue Ready Transfer Foam, 2.5x2.5 (in/in) 3 x Per Week/30 Days Discharge Instructions: Apply Hydrofera Blue Ready to wound bed as directed Secondary Dressing: ABD Pad 5x9 (in/in) 3 x Per Week/30 Days Discharge Instructions: Cover with ABD pad Compression Wrap: Profore Lite LF 3 Multilayer Compression Bandaging System 3 x Per Week/30 Days Discharge Instructions: Apply 3 multi-layer wrap as prescribed. Electronic Signature(s) Signed: 09/26/2020 5:05:31 PM By: Georges Mouse, Minus Breeding RN Signed: 09/26/2020 5:07:17 PM By: Linton Ham MD Entered By: Georges Mouse, Minus Breeding on 09/26/2020 09:41:28 Yusko, Macaulay L. ([id number]) -------------------------------------------------------------------------------- Problem List Details Patient Name: Schiavi, Zyrell L. Date of Service: 09/26/2020 9:15 AM Medical Record Number: [id number] Patient Account Number: [id number] Date of Birth/Sex: 1961/01/14 (60 y.o. 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 ([id number]) -------------------------------------------------------------------------------- Progress Note Details Patient Name: Domagala, Rondy L. Date of Service: 09/26/2020 9:15 AM Medical Record Number: [id number] Patient Account Number: [id number] Date of Birth/Sex: 04-28-1961 (60 y.o. M) Treating RN: Dolan Amen Primary Care Provider: Dorisann Frames Other Clinician: Referring Provider: Dorisann Frames Treating Provider/Extender: Tito Dine in Treatment: 12 Subjective History of Present Illness (HPI) 07/04/2020 patient presents today for initial evaluation here in our clinic. He is actually a ward of the state. With that being said he is only been in the group home that he is currently staying in for the past few weeks in fact it was only about 2 weeks. He was seen in the ED for evaluation on 06/18/2020 due to an injury that he sustained to his left lower leg. He tells me that he got up in the middle the night to go to the bathroom or something and tripped over a wheelchair causing a significant skin tear/laceration to the leg. Unfortunately this was sutured but has dehisced in some areas and appears to have a significant hematoma and others. The proximal portion of the wound has actually necrosis and the skin flap is can have to be removed. The inferior portion appears to still be healthy  and  has reattached to some degree along the suture line this is still somewhat dehiscing and is can require some sharp debridement. Fortunately there does not appear to be any signs of active infection at this time. With that being said I do believe that we may want to put him back on some preventative antibiotics, Keflex, just to ensure following the debridement today that he is going to be infection free. Released to the best of our ability. The patient does have a history of end-stage renal disease that he is on dialysis. He also has schizophrenia at this point is now a ward of the state as it was deemed that he was not competent to be able to completely care for himself. Apparently his living condition prior to going to the group home was very poor. With that being said he seems to be doing much better now in my opinion and I do feel like that the location is that is a very good facility. The patient is going require some sharp debridement today and we did talk to [name] who is at the [facility] to gain approval for the patient. This was for debridement in particular. We did gain that approval and it was myself in [place] who witnessed this. Upon evaluation today patient appears to be doing a little better in regard to his wound currently. Fortunately there is no signs of active infection at this time. No fevers, chills, nausea, vomiting, or diarrhea. He has been taking the Keflex without complication. With that being said I am concerned today that he did develop a blood clot under the skin flap that skin to prevent him healing. I think we need to do something to try to prevent this from continuing to be an issue. For that reason I discussed with him the possibility of doing a Unna boot wrap which I think it was going to be the best thing for him as far as healing is concerned he is in agreement with the plan. 07/18/2020. Upon evaluation today patient's wound bed actually  showed signs of improvement compared to previous evaluation. I feel like he is granulating in and does seem to be healing quite nicely. Fortunately there is no evidence of active infection which is great news and overall I think that he is headed in the right direction. 07/25/2020 upon evaluation today patient's wound actually appears to show signs of good improvement. I am actually very pleased with where we stand today. He is actually showing some signs of hypergranulation and I think he could benefit from switching to [facility]. That was discussed with the patient to let them know what was going on today as well. 08/01/2020 upon evaluation today patient appears to be doing excellent in regard to his wound on his leg. Fortunately there is no signs of active infection at this time. No fevers, chills, nausea, vomiting, or diarrhea. 08/15/2020 on evaluation today patient appears to be doing well with regard to his leg ulcer. This is showing signs of excellent improvement overall I am extremely pleased with where we stand he is definitely significantly smaller. 08/29/2020 upon evaluation today patient appears to be doing excellent in regard to his wound. Is been tolerating the dressing changes without complication. Fortunately I think that the patient is making excellent progress and in general I am extremely pleased with where things stand today. 09/12/2020 upon evaluation today patient appears to be doing well with regard to his wound. In fact this  is showing signs of doing excellent as measuring significantly smaller and seems to overall be making great progress. I am extremely pleased with where we stand today. Overall I do not see any evidence of infection which is great news as well. 4/28; 2-week follow-up visit. The patient has home health changing his dressing. This was initially traumatic wound on the left anterior lower leg which was large plus or minus a hematoma. In any case it is now down  to a very small healthy looking area in the superior part of the original wound we have been using Hydrofera Blue under compression Objective Constitutional Sitting or standing Blood Pressure is within target range for patient.. Pulse regular and within target range for patient.Marland Kitchen Respirations regular, non- labored and within target range.. Temperature is normal and within the target range for the patient.Marland Kitchen appears in no distress. Vitals Time Taken: 9:18 AM, Height: 75 in, Weight: 190 lbs, BMI: 23.7, Temperature: 98.4 F, Pulse: 79 bpm, Respiratory Rate: 16 breaths/min, Blood Pressure: 123/50 mmHg. Debellis, Sufian L. ([id number]) Cardiovascular Pedal pulses are palpable. Excellent edema control. General Notes: Wound exam; left anterior lower leg mid tibia. The wound itself is really very healthy. Nice healthy granulation tissue surrounded by rims of epithelialization. The rest of the patient's original substantial wound has remained closed. Integumentary (Hair, Skin) Wound #1 status is Open. Original cause of wound was Laceration. The date acquired was: 06/18/2020. The wound has been in treatment 12 weeks. The wound is located on the Left,Anterior Lower Leg. The wound measures 1cm length x 1cm width x 0.1cm depth; 0.785cm^2 area and 0.079cm^3 volume. There is Fat Layer (Subcutaneous Tissue) exposed. There is no tunneling or undermining noted. There is a medium amount of serous drainage noted. The wound margin is flat and intact. There is large (67-100%) red granulation within the wound bed. There is no necrotic tissue within the wound bed. Assessment Active Problems ICD-10 Unspecified open wound, left lower leg, initial encounter Non-pressure chronic ulcer of other part of left lower leg with fat layer exposed End stage renal disease Dependence on renal dialysis Other schizophrenia Procedures Wound #1 Pre-procedure diagnosis of Wound #1 is a Trauma, Other located on the Left,Anterior Lower  Leg . There was a Three Layer Compression Therapy Procedure by Dolan Amen, RN. Post procedure Diagnosis Wound #1: Same as Pre-Procedure Notes: pt tolerating wrap well. Plan Follow-up Appointments: Return Appointment in 2 weeks. Home Health: [place]: [facility] for [facility] care. May utilize formulary equivalent dressing for wound treatment orders unless otherwise specified. Home Health Nurse may visit PRN to address patient s wound care needs. Scheduled days for dressing changes to be completed; exception, patient has scheduled wound care visit that day. - Dressing changes Tuesday/Thursday in office when he has appt/Saturday **Please direct any NON-WOUND related issues/requests for orders to patient's Primary Care Physician. **If current dressing causes regression in wound condition, may D/C ordered dressing product/s and apply Normal Saline Moist Dressing daily until next [place] or Other MD appointment. **Notify [facility]aling Center of regression in wound condition at [phone]. Anesthetic (Use 'Patient Medications' Section for Anesthetic Order Entry): Lidocaine applied to wound bed Benzocaine applied to wound bed. WOUND #1: - Lower Leg Wound Laterality: Left, Anterior Cleanser: Soap and Water 3 x Per Week/30 Days Discharge Instructions: Gently cleanse wound with antibacterial soap, rinse and pat dry prior to dressing wounds Primary Dressing: Hydrofera Blue Ready Transfer Foam, 2.5x2.5 (in/in) 3 x Per Week/30 Days  Discharge Instructions: Apply Hydrofera Blue Ready to wound bed as directed Secondary Dressing: ABD Pad 5x9 (in/in) 3 x Per Week/30 Days Discharge Instructions: Cover with ABD pad Compression Wrap: Profore Lite LF 3 Multilayer Compression Bandaging System 3 x Per Week/30 Days Discharge Instructions: Apply 3 multi-layer wrap as prescribed. Nor[place]ton, Reynoldo L. ([id number]) 1. Continue with Hydrofera Blue under 3 layer compression.  ABD pad in between the 2. 2 this may be closed by the next time he is here in 2 weeks Electronic Signature(s) Signed: 09/26/2020 5:07:17 PM By: Linton Ham MD Entered By: Linton Ham on 09/26/2020 09:57:51 Crays, Naksh L. ([id number]) -------------------------------------------------------------------------------- SuperBill Details Patient Name: Poinsett, Cambell L. Date of Service: 09/26/2020 Medical Record Number: [id number] Patient Account Number: [id number] Date of Birth/Sex: 06/29/1960 (60 y.o. M) Treating RN: Dolan Amen Primary Care Provider: Dorisann Frames Other Clinician: Referring Provider: Dorisann Frames Treating Provider/Extender: Tito Dine in Treatment: 12 Diagnosis Coding ICD-10 Codes Code Description [phone] Unspecified open wound, left lower leg, initial encounter 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 layer exposed N18.6 End stage renal disease Z99.2 Dependence on renal dialysis F20.89 Other schizophrenia Facility Procedures CPT4 Code: [id number] Description: (Facility Use Only) [phone] - [place] LWR LT LEG Modifier: Quantity: 1 Physician Procedures CPT4 [id number] Description: [id number] - WC PHYS LEVEL 3 - EST PT Modifier: Quantity: 1 CPT4 Code: Description: ICD-10 Diagnosis Description 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 lay S81.802A Unspecified open wound, left lower leg, initial encounter Modifier: er exposed Quantity: Electronic Signature(s) Signed: 09/26/2020 5:07:17 PM By: Linton Ham MD Entered By: Linton Ham on 09/26/2020 09:58:08

## 2020-09-27 NOTE — Progress Notes (Signed)
Coiner, Duriel L. ([id number]) Visit Report for 09/26/2020 Arrival Information Details Patient Name: [name], [name] Date of Service: 09/26/2020 9:15 AM Medical Record Number: [id number] Patient Account Number: [id number] Date of Birth/Sex: 22-Sep-1960 (60 y.o. M) Treating RN: Carlene Coria Primary Care Adriyanna Christians: Dorisann Frames Other Clinician: Referring Karleen Seebeck: Dorisann Frames Treating Oswell Say/Extender: Tito Dine in Treatment: 12 Visit Information History Since Last Visit All ordered tests and consults were completed: No Patient Arrived: Wheel Chair Added or deleted any medications: No Arrival Time: 09:15 Any new allergies or adverse reactions: No Accompanied By: caregiver Had a fall or experienced change in No Transfer Assistance: None activities of daily living that may affect Patient Identification Verified: Yes risk of falls: Secondary Verification Process Completed: Yes Signs or symptoms of abuse/neglect since last visito No Patient Requires Transmission-Based No Hospitalized since last visit: No Precautions: Implantable device outside of the clinic excluding No Patient Has Alerts: Yes cellular tissue based products placed in the center Patient Alerts: 07/25/2020 ABI left since last visit: Wilber>[id number] Has Dressing in Place as Prescribed: Yes B/P in RIGHT ARM Has Compression in Place as Prescribed: Yes ONLY DSS/signatures Pain Present Now: No Electronic Signature(s) Signed: 09/27/2020 8:24:32 AM By: Carlene Coria RN Entered By: Carlene Coria on 09/26/2020 09:18:48 [name], [name] ([id number]) -------------------------------------------------------------------------------- Clinic Level of Care Assessment Details Patient Name: [name], [name] Date of Service: 09/26/2020 9:15 AM Medical Record Number: [id number] Patient Account Number: [id number] Date of Birth/Sex: [birth year] (60 y.o. M) Treating RN: Dolan Amen Primary Care Kesley Mullens: Dorisann Frames  Other Clinician: Referring Tasneem Cormier: Dorisann Frames Treating Sanna Porcaro/Extender: Tito Dine in Treatment: 12 Clinic Level of Care Assessment Items TOOL 1 Quantity Score '[]'$  - Use when EandM and Procedure is performed on INITIAL visit 0 ASSESSMENTS - Nursing Assessment / Reassessment '[]'$  - General Physical Exam (combine w/ comprehensive assessment (listed just below) when performed on new 0 pt. evals) '[]'$  - 0 Comprehensive Assessment (HX, ROS, Risk Assessments, Wounds Hx, etc.) ASSESSMENTS - Wound and Skin Assessment / Reassessment '[]'$  - Dermatologic / Skin Assessment (not related to wound area) 0 ASSESSMENTS - Ostomy and/or Continence Assessment and Care '[]'$  - Incontinence Assessment and Management 0 '[]'$  - 0 Ostomy Care Assessment and Management (repouching, etc.) PROCESS - Coordination of Care '[]'$  - Simple Patient / Family Education for ongoing care 0 '[]'$  - 0 Complex (extensive) Patient / Family Education for ongoing care '[]'$  - 0 Staff obtains Programmer, systems, Records, Test Results / Process Orders '[]'$  - 0 Staff telephones HHA, Nursing Homes / Clarify orders / etc '[]'$  - 0 Routine Transfer to another Facility (non-emergent condition) '[]'$  - 0 Routine Hospital Admission (non-emergent condition) '[]'$  - 0 New Admissions / Biomedical engineer / Ordering NPWT, Apligraf, etc. '[]'$  - 0 Emergency Hospital Admission (emergent condition) PROCESS - Special Needs '[]'$  - Pediatric / Minor Patient Management 0 '[]'$  - 0 Isolation Patient Management '[]'$  - 0 Hearing / Language / Visual special needs '[]'$  - 0 Assessment of Community assistance (transportation, D/C planning, etc.) '[]'$  - 0 Additional assistance / Altered mentation '[]'$  - 0 Support Surface(s) Assessment (bed, cushion, seat, etc.) INTERVENTIONS - Miscellaneous '[]'$  - External ear exam 0 '[]'$  - 0 Patient Transfer (multiple staff / Civil Service fast streamer / Similar devices) '[]'$  - 0 Simple Staple / Suture removal (25 or less) '[]'$  - 0 Complex Staple /  Suture removal (26 or more) '[]'$  - 0 Hypo/Hyperglycemic Management (do not check if billed separately) '[]'$  - 0 Ankle / Brachial Index (ABI) - do not  check if billed separately Has the patient been seen at the hospital within the last three years: Yes Total Score: 0 Level Of Care: ____ [name], [name] ([id number]) Electronic Signature(s) Signed: 09/26/2020 5:05:31 PM By: Georges Mouse, Minus Breeding RN Entered By: Georges Mouse, Minus Breeding on 09/26/2020 09:41:34 [name], [name] ([id number]) -------------------------------------------------------------------------------- Compression Therapy Details Patient Name: [name], [name] Date of Service: 09/26/2020 9:15 AM Medical Record Number: [id number] Patient Account Number: [id number] Date of Birth/Sex: [birth year] (60 y.o. M) Treating RN: Dolan Amen Primary Care Wendy Mikles: Dorisann Frames Other Clinician: Referring Emilianna Barlowe: Dorisann Frames Treating Alakai Macbride/Extender: Tito Dine in Treatment: 12 Compression Therapy Performed for Wound Assessment: Wound #1 Left,Anterior Lower Leg Performed By: Clinician Dolan Amen, RN Compression Type: Three Layer Post Procedure Diagnosis Same as Pre-procedure Notes pt tolerating wrap well Electronic Signature(s) Signed: 09/26/2020 5:05:31 PM By: Georges Mouse, Minus Breeding RN Entered By: Georges Mouse, Kenia on 09/26/2020 09:41:07 [name], [name] ([id number]) -------------------------------------------------------------------------------- Encounter Discharge Information Details Patient Name: [name], [name] Date of Service: 09/26/2020 9:15 AM Medical Record Number: [id number] Patient Account Number: [id number] Date of Birth/Sex: [birth year] (60 y.o. M) Treating RN: Dolan Amen Primary Care Zared Knoth: Dorisann Frames Other Clinician: Referring Teala Daffron: Dorisann Frames Treating Ketih Goodie/Extender: Tito Dine in Treatment: 12 Encounter Discharge Information  Items Discharge Condition: Stable Ambulatory Status: Wheelchair Discharge Destination: Home Transportation: Private Auto Accompanied By: caregiver Schedule Follow-up Appointment: Yes Clinical Summary of Care: Electronic Signature(s) Signed: 09/26/2020 4:35:13 PM By: Jeanine Luz Entered By: Jeanine Luz on 09/26/2020 10:00:05 Schartz, Carless L. ([id number]) -------------------------------------------------------------------------------- Lower Extremity Assessment Details Patient Name: [name], [name] Date of Service: 09/26/2020 9:15 AM Medical Record Number: [id number] Patient Account Number: [id number] Date of Birth/Sex: February 11, 1961 (60 y.o. M) Treating RN: Carlene Coria Primary Care Chantil Bari: Dorisann Frames Other Clinician: Referring Arora Coakley: Dorisann Frames Treating Ceyda Peterka/Extender: Tito Dine in Treatment: 12 Edema Assessment Assessed: [Left: No] [Right: No] [Left: Edema] [Right: :] Calf Left: Right: Point of Measurement: 39 cm From Medial Instep 32 cm Ankle Left: Right: Point of Measurement: 10 cm From Medial Instep 22 cm Vascular Assessment Pulses: Dorsalis Pedis Palpable: [Left:Yes] Electronic Signature(s) Signed: 09/27/2020 8:24:32 AM By: Carlene Coria RN Entered By: Carlene Coria on 09/26/2020 09:29:21 Filion, Mercury L. ([id number]) -------------------------------------------------------------------------------- Multi Wound Chart Details Patient Name: [name], [name] Date of Service: 09/26/2020 9:15 AM Medical Record Number: [id number] Patient Account Number: [id number] Date of Birth/Sex: March 07, 1961 (60 y.o. M) Treating RN: Dolan Amen Primary Care Atiba Kimberlin: Dorisann Frames Other Clinician: Referring Chaia Ikard: Dorisann Frames Treating Sebastiano Luecke/Extender: Tito Dine in Treatment: 12 Vital Signs Height(in): 75 Pulse(bpm): 37 Weight(lbs): 190 Blood Pressure(mmHg): 123/50 Body Mass Index(BMI): 24 Temperature(F):  98.4 Respiratory Rate(breaths/min): 16 Photos: [N/A:N/A] Wound Location: Left, Anterior Lower Leg N/A N/A Wounding Event: Laceration N/A N/A Primary Etiology: Trauma, Other N/A N/A Comorbid History: Chronic Obstructive Pulmonary N/A N/A Disease (COPD), Hypertension Date Acquired: 06/18/2020 N/A N/A Weeks of Treatment: 12 N/A N/A Wound Status: Open N/A N/A Measurements L x W x D (cm) 1x1x0.1 N/A N/A Area (cm) : 0.785 N/A N/A Volume (cm) : 0.079 N/A N/A % Reduction in Area: 98.50% N/A N/A % Reduction in Volume: 99.60% N/A N/A Classification: Full Thickness Without Exposed N/A N/A Support Structures Exudate Amount: Medium N/A N/A Exudate Type: Serous N/A N/A Exudate Color: amber N/A N/A Wound Margin: Flat and Intact N/A N/A Granulation Amount: Large (67-100%) N/A N/A Granulation Quality: Red N/A N/A Necrotic Amount: None Present (0%) N/A N/A Exposed Structures: Fat Layer (Subcutaneous Tissue): N/A N/A  Yes Fascia: No Tendon: No Muscle: No Joint: No Bone: No Epithelialization: Small (1-33%) N/A N/A Procedures Performed: Compression Therapy N/A N/A Treatment Notes Electronic Signature(s) Signed: 09/26/2020 5:07:17 PM By: Linton Ham MD Entered By: Linton Ham on 09/26/2020 09:55:14 Maurer, Ahron L. ([id number]) -------------------------------------------------------------------------------- Multi-Disciplinary Care Plan Details Patient Name: [name], [name] Date of Service: 09/26/2020 9:15 AM Medical Record Number: [id number] Patient Account Number: [id number] Date of Birth/Sex: [birth year] (60 y.o. M) Treating RN: Dolan Amen Primary Care Armas Mcbee: Dorisann Frames Other Clinician: Referring Mariadelaluz Guggenheim: Dorisann Frames Treating Serrena Linderman/Extender: Tito Dine in Treatment: 12 Active Inactive Electronic Signature(s) Signed: 09/26/2020 5:05:31 PM By: Georges Mouse, Minus Breeding RN Entered By: Georges Mouse, Minus Breeding on 09/26/2020 09:40:29 [name], Todd  L. ([id number]) -------------------------------------------------------------------------------- Pain Assessment Details Patient Name: [name], [name] Date of Service: 09/26/2020 9:15 AM Medical Record Number: [id number] Patient Account Number: [id number] Date of Birth/Sex: March 10, 1961 (60 y.o. M) Treating RN: Carlene Coria Primary Care Ashaad Gaertner: Dorisann Frames Other Clinician: Referring Fayne Mcguffee: Dorisann Frames Treating Isabela Nardelli/Extender: Tito Dine in Treatment: 12 Active Problems Location of Pain Severity and Description of Pain Patient Has Paino No Site Locations Pain Management and Medication Current Pain Management: Electronic Signature(s) Signed: 09/27/2020 8:24:32 AM By: Carlene Coria RN Entered By: Carlene Coria on 09/26/2020 09:19:09 [name], [name] ([id number]) -------------------------------------------------------------------------------- Patient/Caregiver Education Details Patient Name: Loveday, [name] Date of Service: 09/26/2020 9:15 AM Medical Record Number: [id number] Patient Account Number: [id number] Date of Birth/Gender: Aug 31, 1960 (60 y.o. M) Treating RN: Dolan Amen Primary Care Physician: Dorisann Frames Other Clinician: Referring Physician: Dorisann Frames Treating Physician/Extender: Tito Dine in Treatment: 12 Education Assessment Education Provided To: Patient and Caregiver Education Topics Provided Wound/Skin Impairment: Methods: Explain/Verbal Responses: State content correctly Electronic Signature(s) Signed: 09/26/2020 5:05:31 PM By: Georges Mouse, Minus Breeding RN Entered By: Georges Mouse, Minus Breeding on 09/26/2020 09:41:53 Ibe, Linda L. ([id number]) -------------------------------------------------------------------------------- Wound Assessment Details Patient Name: Mcgrady, Nero L. Date of Service: 09/26/2020 9:15 AM Medical Record Number: [id number] Patient Account Number: [id number] Date of Birth/Sex:  [birth year] (60 y.o. M) Treating RN: Carlene Coria Primary Care Sederick Jacobsen: Dorisann Frames Other Clinician: Referring Tahj Lindseth: Dorisann Frames Treating Jennifier Smitherman/Extender: Tito Dine in Treatment: 12 Wound Status Wound Number: 1 Primary Trauma, Other Etiology: Wound Location: Left, Anterior Lower Leg Wound Status: Open Wounding Event: Laceration Comorbid Chronic Obstructive Pulmonary Disease (COPD), Date Acquired: 06/18/2020 History: Hypertension Weeks Of Treatment: 12 Clustered Wound: No Photos Wound Measurements Length: (cm) 1 Width: (cm) 1 Depth: (cm) 0.1 Area: (cm) 0.785 Volume: (cm) 0.079 % Reduction in Area: 98.5% % Reduction in Volume: 99.6% Epithelialization: Small (1-33%) Tunneling: No Undermining: No Wound Description Classification: Full Thickness Without Exposed Support Structu Wound Margin: Flat and Intact Exudate Amount: Medium Exudate Type: Serous Exudate Color: amber res Foul Odor After Cleansing: No Slough/Fibrino No Wound Bed Granulation Amount: Large (67-100%) Exposed Structure Granulation Quality: Red Fascia Exposed: No Necrotic Amount: None Present (0%) Fat Layer (Subcutaneous Tissue) Exposed: Yes Tendon Exposed: No Muscle Exposed: No Joint Exposed: No Bone Exposed: No Treatment Notes Wound #1 (Lower Leg) Wound Laterality: Left, Anterior Cleanser Soap and Water Discharge Instruction: Gently cleanse wound with antibacterial soap, rinse and pat dry prior to dressing wounds Peri-Wound Care Hammerschmidt, Japheth L. ([id number]) Topical Primary Dressing Hydrofera Blue Ready Transfer Foam, 2.5x2.5 (in/in) Discharge Instruction: Apply Hydrofera Blue Ready to wound bed as directed Secondary Dressing ABD Pad 5x9 (in/in) Discharge Instruction: Cover with ABD pad Secured With Compression Wrap Profore Lite LF [place] Discharge Instruction:  Apply 3 multi-layer wrap as prescribed. Compression  Stockings Add-Ons Electronic Signature(s) Signed: 09/27/2020 8:24:32 AM By: Carlene Coria RN Entered By: Carlene Coria on 09/26/2020 09:28:44 Bellina, Narada L. ([id number]) -------------------------------------------------------------------------------- Vitals Details Patient Name: Topor, Ysmael L. Date of Service: 09/26/2020 9:15 AM Medical Record Number: [id number] Patient Account Number: [id number] Date of Birth/Sex: [birth year] (60 y.o. M) Treating RN: Carlene Coria Primary Care Loras Grieshop: Dorisann Frames Other Clinician: Referring Dewie Ahart: Dorisann Frames Treating Braidyn Scorsone/Extender: Tito Dine in Treatment: 12 Vital Signs Time Taken: 09:18 Temperature (F): 98.4 Height (in): 75 Pulse (bpm): 79 Weight (lbs): 190 Respiratory Rate (breaths/min): 16 Body Mass Index (BMI): 23.7 Blood Pressure (mmHg): 123/50 Reference Range: 80 - 120 mg / dl Electronic Signature(s) Signed: 09/27/2020 8:24:32 AM By: Carlene Coria RN Entered By: Carlene Coria on 09/26/2020 09:19:04

## 2020-10-10 ENCOUNTER — Other Ambulatory Visit: Payer: Self-pay

## 2020-10-10 ENCOUNTER — Encounter: Payer: Medicaid Other | Attending: Physician Assistant | Admitting: Physician Assistant

## 2020-10-10 DIAGNOSIS — Z992 Dependence on renal dialysis: Secondary | ICD-10-CM | POA: Insufficient documentation

## 2020-10-10 DIAGNOSIS — N186 End stage renal disease: Secondary | ICD-10-CM | POA: Diagnosis not present

## 2020-10-10 DIAGNOSIS — L97822 Non-pressure chronic ulcer of other part of left lower leg with fat layer exposed: Secondary | ICD-10-CM | POA: Diagnosis present

## 2020-10-10 DIAGNOSIS — I120 Hypertensive chronic kidney disease with stage 5 chronic kidney disease or end stage renal disease: Secondary | ICD-10-CM | POA: Diagnosis not present

## 2020-10-10 DIAGNOSIS — J449 Chronic obstructive pulmonary disease, unspecified: Secondary | ICD-10-CM | POA: Diagnosis not present

## 2020-10-10 NOTE — Progress Notes (Addendum)
Simmering, Amir L. ([id number]) Visit Report for 10/10/2020 Chief Complaint Document Details Patient Name: Todd Brown, [name] Date of Service: 10/10/2020 9:15 AM Medical Record Number: [id number] Patient Account Number: [id number] Date of Birth/Sex: [birth year] (60 y.o. M) Treating RN: Dolan Amen Primary Care Provider: Dorisann Frames Other Clinician: Referring Provider: Dorisann Frames Treating Provider/Extender: [name] in Treatment: 14 Information Obtained from: Patient Chief Complaint Left LE Traumatic Ulcer Electronic Signature(s) Signed: 10/10/2020 9:49:14 AM By: Worthy Keeler PA-C Entered By: Worthy Keeler on 10/10/2020 09:49:14 Todd Brown, [name] ([id number]) -------------------------------------------------------------------------------- HPI Details Patient Name: [name], [name] Date of Service: 10/10/2020 9:15 AM Medical Record Number: [id number] Patient Account Number: [id number] Date of Birth/Sex: 01-16-1961 (60 y.o. M) Treating RN: Dolan Amen Primary Care Provider: Dorisann Frames Other Clinician: Referring Provider: Dorisann Frames Treating Provider/Extender: [name] in Treatment: 14 History of Present Illness HPI Description: 07/04/2020 patient presents today for initial evaluation here in our clinic. He is actually a ward of the state. With that being said he is only been in the group home that he is currently staying in for the past few weeks in fact it was only about 2 weeks. He was seen in the ED for evaluation on 06/18/2020 due to an injury that he sustained to his left lower leg. He tells me that he got up in the middle the night to go to the bathroom or something and tripped over a wheelchair causing a significant skin tear/laceration to the leg. Unfortunately this was sutured but has dehisced in some areas and appears to have a significant hematoma and others. The proximal portion of the wound has actually necrosis and the skin flap  is can have to be removed. The inferior portion appears to still be healthy and has reattached to some degree along the suture line this is still somewhat dehiscing and is can require some sharp debridement. Fortunately there does not appear to be any signs of active infection at this time. With that being said I do believe that we may want to put him back on some preventative antibiotics, Keflex, just to ensure following the debridement today that he is going to be infection free. Released to the best of our ability. The patient does have a history of end-stage renal disease that he is on dialysis. He also has schizophrenia at this point is now a ward of the state as it was deemed that he was not competent to be able to completely care for himself. Apparently his living condition prior to going to the group home was very poor. With that being said he seems to be doing much better now in my opinion and I do feel like that the location is that is a very good facility. The patient is going require some sharp debridement today and we did talk to [name] who is at the [facility] to gain approval for the patient. This was for debridement in particular. We did gain that approval and it was myself in [place] who witnessed this. Upon evaluation today patient appears to be doing a little better in regard to his wound currently. Fortunately there is no signs of active infection at this time. No fevers, chills, nausea, vomiting, or diarrhea. He has been taking the Keflex without complication. With that being said I am concerned today that he did develop a blood clot under the skin flap that skin to prevent him healing. I think we need to do something to try  to prevent this from continuing to be an issue. For that reason I discussed with him the possibility of doing a Unna boot wrap which I think it was going to be the best thing for him as far as healing is concerned he is in agreement  with the plan. 07/18/2020. Upon evaluation today patient's wound bed actually showed signs of improvement compared to previous evaluation. I feel like he is granulating in and does seem to be healing quite nicely. Fortunately there is no evidence of active infection which is great news and overall I think that he is headed in the right direction. 07/25/2020 upon evaluation today patient's wound actually appears to show signs of good improvement. I am actually very pleased with where we stand today. He is actually showing some signs of hypergranulation and I think he could benefit from switching to [facility]. That was discussed with the patient to let them know what was going on today as well. 08/01/2020 upon evaluation today patient appears to be doing excellent in regard to his wound on his leg. Fortunately there is no signs of active infection at this time. No fevers, chills, nausea, vomiting, or diarrhea. 08/15/2020 on evaluation today patient appears to be doing well with regard to his leg ulcer. This is showing signs of excellent improvement overall I am extremely pleased with where we stand he is definitely significantly smaller. 08/29/2020 upon evaluation today patient appears to be doing excellent in regard to his wound. Is been tolerating the dressing changes without complication. Fortunately I think that the patient is making excellent progress and in general I am extremely pleased with where things stand today. 09/12/2020 upon evaluation today patient appears to be doing well with regard to his wound. In fact this is showing signs of doing excellent as measuring significantly smaller and seems to overall be making great progress. I am extremely pleased with where we stand today. Overall I do not see any evidence of infection which is great news as well. 4/28; 2-week follow-up visit. The patient has home health changing his dressing. This was initially traumatic wound on the left anterior  lower leg which was large plus or minus a hematoma. In any case it is now down to a very small healthy looking area in the superior part of the original wound we have been using Hydrofera Blue under compression 10/10/2020 upon evaluation today patient's wound is almost completely healed. He does have a small blister just lateral to where the actual wound opening is here today but again this is nothing too significant which is great news. Overall I am extremely pleased with where he stands at this point. No fevers, chills, nausea, vomiting, or diarrhea. Electronic Signature(s) Signed: 10/10/2020 7:42:39 PM By: Worthy Keeler PA-C Entered By: Worthy Keeler on 10/10/2020 19:42:39 Todd Brown, [name] ([id number]) -------------------------------------------------------------------------------- Physical Exam Details Patient Name: Taliercio, Ishmael L. Date of Service: 10/10/2020 9:15 AM Medical Record Number: [id number] Patient Account Number: [id number] Date of Birth/Sex: Jun 03, 1960 (60 y.o. M) Treating RN: Dolan Amen Primary Care Provider: Dorisann Frames Other Clinician: Referring Provider: Dorisann Frames Treating Provider/Extender: [name] in Treatment: 23 Constitutional Well-nourished and well-hydrated in no acute distress. Respiratory normal breathing without difficulty. Psychiatric this patient is able to make decisions and demonstrates good insight into disease process. Alert and Oriented x 3. pleasant and cooperative. Notes Upon inspection patient's wound bed showed signs of good granulation and epithelization at this point. There does not appear to be any signs of  active infection which is great news. Overall I am extremely pleased with where things stand and how the patient seems to be progressing and I think he is very close to complete resolution by next visit. Electronic Signature(s) Signed: 10/10/2020 7:42:53 PM By: Worthy Keeler PA-C Entered By: Worthy Keeler  on 10/10/2020 19:42:53 Todd Brown, [name] ([id number]) -------------------------------------------------------------------------------- Physician Orders Details Patient Name: Brunell, Jaceon L. Date of Service: 10/10/2020 9:15 AM Medical Record Number: [id number] Patient Account Number: [id number] Date of Birth/Sex: Dec 15, 1960 (60 y.o. M) Treating RN: Dolan Amen Primary Care Provider: Dorisann Frames Other Clinician: Referring Provider: Dorisann Frames Treating Provider/Extender: [name] in Treatment: 14 Verbal / Phone Orders: No Diagnosis Coding ICD-10 Coding Code Description [phone] Unspecified open wound, left lower leg, initial encounter 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 layer exposed N18.6 End stage renal disease Z99.2 Dependence on renal dialysis F20.89 Other schizophrenia Follow-up Appointments o Return Appointment in 2 weeks. [place]: - [facility] o ADMIT to Home Health for wound care. May utilize formulary equivalent dressing for wound treatment orders unless otherwise specified. Home Health Nurse may visit PRN to address patientos wound care needs. o Scheduled days for dressing changes to be completed; exception, patient has scheduled wound care visit that day. - Dressing changes Tuesday/Thursday in office when he has appt/Saturday o **Please direct any NON-WOUND related issues/requests for orders to patient's Primary Care Physician. **If current dressing causes regression in wound condition, may D/C ordered dressing product/s and apply Normal Saline Moist Dressing daily until next [employer] or Other MD appointment. **Notify [facility] of regression in wound condition at [phone]. Anesthetic (Use 'Patient Medications' Section for Anesthetic Order Entry) o Lidocaine applied to wound bed o Benzocaine applied to wound bed. Wound Treatment Wound #1 - Lower  Leg Wound Laterality: Left, Anterior Cleanser: Soap and Water 3 x Per Week/30 Days Discharge Instructions: Gently cleanse wound with antibacterial soap, rinse and pat dry prior to dressing wounds Primary Dressing: Hydrofera Blue Ready Transfer Foam, 2.5x2.5 (in/in) 3 x Per Week/30 Days Discharge Instructions: Apply Hydrofera Blue Ready to wound bed as directed Secondary Dressing: ABD Pad 5x9 (in/in) 3 x Per Week/30 Days Discharge Instructions: Cover with ABD pad Compression Wrap: Profore Lite LF 3 Multilayer Compression Bandaging System 3 x Per Week/30 Days Discharge Instructions: Apply 3 multi-layer wrap as prescribed. Wound #2 - Lower Leg Wound Laterality: Left, Medial Cleanser: Soap and Water 3 x Per Week/30 Days Discharge Instructions: Gently cleanse wound with antibacterial soap, rinse and pat dry prior to dressing wounds Primary Dressing: Hydrofera Blue Ready Transfer Foam, 2.5x2.5 (in/in) 3 x Per Week/30 Days Discharge Instructions: Apply Hydrofera Blue Ready to wound bed as directed Secondary Dressing: ABD Pad 5x9 (in/in) 3 x Per Week/30 Days Discharge Instructions: Cover with ABD pad Compression Wrap: Profore Lite LF 3 Multilayer Compression Bandaging System 3 x Per Week/30 Days Discharge Instructions: Apply 3 multi-layer wrap as prescribed. Todd Brown, [name] ([id number]) Electronic Signature(s) Signed: 10/10/2020 5:06:40 PM By: Georges Mouse, Minus Breeding RN Signed: 10/10/2020 8:15:19 PM By: Worthy Keeler PA-C Entered By: Georges Mouse, Minus Breeding on 10/10/2020 10:18:44 Todd Brown, [name] ([id number]) -------------------------------------------------------------------------------- Problem List Details Patient Name: Twombly, Corney L. Date of Service: 10/10/2020 9:15 AM Medical Record Number: [id number] Patient Account Number: [id number] Date of Birth/Sex: 05/26/1961 (60 y.o. M) Treating RN: Dolan Amen Primary Care Provider: Dorisann Frames Other Clinician: Referring Provider:  Dorisann Frames Treating Provider/Extender: [name]  in Treatment: 14 Active Problems ICD-10 Encounter Code Description Active Date MDM Diagnosis S81.802A Unspecified open wound, left lower leg, initial encounter 07/04/2020 No Yes 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 layer 07/04/2020 No Yes exposed N18.6 End stage renal disease 07/04/2020 No Yes Z99.2 Dependence on renal dialysis 07/04/2020 No Yes F20.89 Other schizophrenia 07/04/2020 No Yes Inactive Problems Resolved Problems Electronic Signature(s) Signed: 10/10/2020 9:49:06 AM By: Worthy Keeler PA-C Entered By: Worthy Keeler on 10/10/2020 09:49:06 Todd Brown, [name] ([id number]) -------------------------------------------------------------------------------- Progress Note Details Patient Name: Todd Brown, [name] Date of Service: 10/10/2020 9:15 AM Medical Record Number: [id number] Patient Account Number: [id number] Date of Birth/Sex: August 28, 1960 (60 y.o. M) Treating RN: Dolan Amen Primary Care Provider: Dorisann Frames Other Clinician: Referring Provider: Dorisann Frames Treating Provider/Extender: [name] in Treatment: 14 Subjective Chief Complaint Information obtained from Patient Left LE Traumatic Ulcer History of Present Illness (HPI) 07/04/2020 patient presents today for initial evaluation here in our clinic. He is actually a ward of the state. With that being said he is only been in the group home that he is currently staying in for the past few weeks in fact it was only about 2 weeks. He was seen in the ED for evaluation on 06/18/2020 due to an injury that he sustained to his left lower leg. He tells me that he got up in the middle the night to go to the bathroom or something and tripped over a wheelchair causing a significant skin tear/laceration to the leg. Unfortunately this was sutured but has dehisced in some areas and appears to have a significant hematoma and  others. The proximal portion of the wound has actually necrosis and the skin flap is can have to be removed. The inferior portion appears to still be healthy and has reattached to some degree along the suture line this is still somewhat dehiscing and is can require some sharp debridement. Fortunately there does not appear to be any signs of active infection at this time. With that being said I do believe that we may want to put him back on some preventative antibiotics, Keflex, just to ensure following the debridement today that he is going to be infection free. Released to the best of our ability. The patient does have a history of end-stage renal disease that he is on dialysis. He also has schizophrenia at this point is now a ward of the state as it was deemed that he was not competent to be able to completely care for himself. Apparently his living condition prior to going to the group home was very poor. With that being said he seems to be doing much better now in my opinion and I do feel like that the location is that is a very good facility. The patient is going require some sharp debridement today and we did talk to [name] who is at the [place] to gain approval for the patient. This was for debridement in particular. We did gain that approval and it was myself in [place] who witnessed this. Upon evaluation today patient appears to be doing a little better in regard to his wound currently. Fortunately there is no signs of active infection at this time. No fevers, chills, nausea, vomiting, or diarrhea. He has been taking the Keflex without complication. With that being said I am concerned today that he did develop a blood clot under the skin flap that skin to prevent him healing.  I think we need to do something to try to prevent this from continuing to be an issue. For that reason I discussed with him the possibility of doing a Unna boot wrap which I think it  was going to be the best thing for him as far as healing is concerned he is in agreement with the plan. 07/18/2020. Upon evaluation today patient's wound bed actually showed signs of improvement compared to previous evaluation. I feel like he is granulating in and does seem to be healing quite nicely. Fortunately there is no evidence of active infection which is great news and overall I think that he is headed in the right direction. 07/25/2020 upon evaluation today patient's wound actually appears to show signs of good improvement. I am actually very pleased with where we stand today. He is actually showing some signs of hypergranulation and I think he could benefit from switching to [facility]. That was discussed with the patient to let them know what was going on today as well. 08/01/2020 upon evaluation today patient appears to be doing excellent in regard to his wound on his leg. Fortunately there is no signs of active infection at this time. No fevers, chills, nausea, vomiting, or diarrhea. 08/15/2020 on evaluation today patient appears to be doing well with regard to his leg ulcer. This is showing signs of excellent improvement overall I am extremely pleased with where we stand he is definitely significantly smaller. 08/29/2020 upon evaluation today patient appears to be doing excellent in regard to his wound. Is been tolerating the dressing changes without complication. Fortunately I think that the patient is making excellent progress and in general I am extremely pleased with where things stand today. 09/12/2020 upon evaluation today patient appears to be doing well with regard to his wound. In fact this is showing signs of doing excellent as measuring significantly smaller and seems to overall be making great progress. I am extremely pleased with where we stand today. Overall I do not see any evidence of infection which is great news as well. 4/28; 2-week follow-up visit. The patient has  home health changing his dressing. This was initially traumatic wound on the left anterior lower leg which was large plus or minus a hematoma. In any case it is now down to a very small healthy looking area in the superior part of the original wound we have been using Hydrofera Blue under compression 10/10/2020 upon evaluation today patient's wound is almost completely healed. He does have a small blister just lateral to where the actual wound opening is here today but again this is nothing too significant which is great news. Overall I am extremely pleased with where he stands at this point. No fevers, chills, nausea, vomiting, or diarrhea. Todd Brown, [name] ([id number]) Objective Constitutional Well-nourished and well-hydrated in no acute distress. Vitals Time Taken: 9:18 AM, Height: 75 in, Weight: 190 lbs, BMI: 23.7, Temperature: 98.4 F, Pulse: 78 bpm, Respiratory Rate: 18 breaths/min, Blood Pressure: 111/55 mmHg. Respiratory normal breathing without difficulty. Psychiatric this patient is able to make decisions and demonstrates good insight into disease process. Alert and Oriented x 3. pleasant and cooperative. General Notes: Upon inspection patient's wound bed showed signs of good granulation and epithelization at this point. There does not appear to be any signs of active infection which is great news. Overall I am extremely pleased with where things stand and how the patient seems to be progressing and I think he is very close to complete resolution by  next visit. Integumentary (Hair, Skin) Wound #1 status is Open. Original cause of wound was Laceration. The date acquired was: 06/18/2020. The wound has been in treatment 14 weeks. The wound is located on the Left,Anterior Lower Leg. The wound measures 0.5cm length x 1cm width x 0.1cm depth; 0.393cm^2 area and 0.039cm^3 volume. There is Fat Layer (Subcutaneous Tissue) exposed. There is a medium amount of serous drainage noted. The wound  margin is flat and intact. There is small (1-33%) red granulation within the wound bed. There is a large (67-100%) amount of necrotic tissue within the wound bed including Eschar. Wound #2 status is Open. Original cause of wound was Gradually Appeared. The date acquired was: 10/10/2020. The wound is located on the Left,Medial Lower Leg. The wound measures 1.3cm length x 0.7cm width x 0.1cm depth; 0.715cm^2 area and 0.071cm^3 volume. There is Fat Layer (Subcutaneous Tissue) exposed. There is no tunneling or undermining noted. There is a medium amount of serous drainage noted. There is large (67-100%) red, pink granulation within the wound bed. There is no necrotic tissue within the wound bed. Assessment Active Problems ICD-10 Unspecified open wound, left lower leg, initial encounter Non-pressure chronic ulcer of other part of left lower leg with fat layer exposed End stage renal disease Dependence on renal dialysis Other schizophrenia Procedures Wound #1 Pre-procedure diagnosis of Wound #1 is a Trauma, Other located on the Left,Anterior Lower Leg . There was a Three Layer Compression Therapy Procedure by Dolan Amen, RN. Post procedure Diagnosis Wound #1: Same as Pre-Procedure Notes: pt tolerating wrap well. Wound #2 Pre-procedure diagnosis of Wound #2 is a Venous Leg Ulcer located on the Left,Medial Lower Leg . There was a Three Layer Compression Therapy Procedure by Dolan Amen, RN. Post procedure Diagnosis Wound #2: Same as Pre-Procedure Notes: pt tolerating wrap well. Plan Follow-up Appointments: Return Appointment in 2 weeks. Todd Brown, [name] ([id number]) Home Health: Short: - [facility] ADMIT to [facility] for wound care. May utilize formulary equivalent dressing for wound treatment orders unless otherwise specified. Home Health Nurse may visit PRN to address patient s wound care needs. Scheduled days for dressing changes to be completed; exception, patient  has scheduled wound care visit that day. - Dressing changes Tuesday/Thursday in office when he has appt/Saturday **Please direct any NON-WOUND related issues/requests for orders to patient's Primary Care Physician. **If current dressing causes regression in wound condition, may D/C ordered dressing product/s and apply Normal Saline Moist Dressing daily until next [place] or Other MD appointment. **Notify [facility] of regression in wound condition at [phone]. Anesthetic (Use 'Patient Medications' Section for Anesthetic Order Entry): Lidocaine applied to wound bed Benzocaine applied to wound bed. WOUND #1: - Lower Leg Wound Laterality: Left, Anterior Cleanser: Soap and Water 3 x Per Week/30 Days Discharge Instructions: Gently cleanse wound with antibacterial soap, rinse and pat dry prior to dressing wounds Primary Dressing: Hydrofera Blue Ready Transfer Foam, 2.5x2.5 (in/in) 3 x Per Week/30 Days Discharge Instructions: Apply Hydrofera Blue Ready to wound bed as directed Secondary Dressing: ABD Pad 5x9 (in/in) 3 x Per Week/30 Days Discharge Instructions: Cover with ABD pad Compression Wrap: Profore Lite LF 3 Multilayer Compression Bandaging System 3 x Per Week/30 Days Discharge Instructions: Apply 3 multi-layer wrap as prescribed. WOUND #2: - Lower Leg Wound Laterality: Left, Medial Cleanser: Soap and Water 3 x Per Week/30 Days Discharge Instructions: Gently cleanse wound with antibacterial soap, rinse and pat dry prior to dressing wounds Primary Dressing: Hydrofera Blue  Ready Transfer Foam, 2.5x2.5 (in/in) 3 x Per Week/30 Days Discharge Instructions: Apply Hydrofera Blue Ready to wound bed as directed Secondary Dressing: ABD Pad 5x9 (in/in) 3 x Per Week/30 Days Discharge Instructions: Cover with ABD pad Compression Wrap: Profore Lite LF 3 Multilayer Compression Bandaging System 3 x Per Week/30 Days Discharge Instructions: Apply 3 multi-layer wrap as  prescribed. 1. Would recommend that we continue with the wound care measures as before and the patient is in agreement with the plan. This includes the use of the [facility] which I think is doing a great job. 2. I am also can recommend that we continue with the ABD pad and a 3 layer compression wrap to follow. 3. We will see him back for follow-up visit in 3 weeks per the request of his caregiver. We will see patient back for reevaluation in 3 weeks here in the clinic. If anything worsens or changes patient will contact our office for additional recommendations. Electronic Signature(s) Signed: 10/10/2020 7:44:22 PM By: Worthy Keeler PA-C Entered By: Worthy Keeler on 10/10/2020 19:44:21 Todd Brown, [name] ([id number]) -------------------------------------------------------------------------------- SuperBill Details Patient Name: Todd Brown, [name] Date of Service: 10/10/2020 Medical Record Number: [id number] Patient Account Number: [id number] Date of Birth/Sex: 1960-09-22 (60 y.o. M) Treating RN: Dolan Amen Primary Care Provider: Dorisann Frames Other Clinician: Referring Provider: Dorisann Frames Treating Provider/Extender: [name] in Treatment: 14 Diagnosis Coding ICD-10 Codes Code Description [phone] Unspecified open wound, left lower leg, initial encounter 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 layer exposed N18.6 End stage renal disease Z99.2 Dependence on renal dialysis F20.89 Other schizophrenia Facility Procedures CPT4 Code: [id number] Description: (Facility Use Only) [id number] - [facility] LWR LT LEG Modifier: Quantity: 1 Physician Procedures CPT4 Code: [id number] Description: [id number] - WC PHYS LEVEL 3 - EST PT Modifier: Quantity: 1 CPT4 Code: Description: ICD-10 Diagnosis Description S81.802A Unspecified open wound, left lower leg, initial encounter L97.822 Non-pressure chronic ulcer of other part of left  lower leg with fat lay N18.6 End stage renal disease Z99.2 Dependence on renal dialysis Modifier: er exposed Quantity: Electronic Signature(s) Signed: 10/10/2020 7:46:34 PM By: Worthy Keeler PA-C Previous Signature: 10/10/2020 5:06:40 PM Version By: Georges Mouse, Minus Breeding RN Entered By: Worthy Keeler on 10/10/2020 19:46:34

## 2020-10-11 NOTE — Progress Notes (Signed)
Frutiger, Duy L. ([id number]) Visit Report for 10/10/2020 Arrival Information Details Patient Name: Todd Brown, [name] Date of Service: 10/10/2020 9:15 AM Medical Record Number: [id number] Patient Account Number: [id number] Date of Birth/Sex: 1960-12-19 (60 y.o. M) Treating RN: Donnamarie Poag Primary Care Markesia Crilly: Dorisann Frames Other Clinician: Referring Evaluna Utke: Dorisann Frames Treating Kaid Seeberger/Extender: [name] in Treatment: 14 Visit Information History Since Last Visit Added or deleted any medications: No Patient Arrived: Wheel Chair Had a fall or experienced change in No Arrival Time: 09:16 activities of daily living that may affect Accompanied By: caregiver risk of falls: Transfer Assistance: EasyPivot Patient Lift Hospitalized since last visit: No Patient Requires Transmission-Based No Has Dressing in Place as Prescribed: Yes Precautions: Pain Present Now: No Patient Has Alerts: Yes Patient Alerts: 07/25/2020 ABI left [place]>220 B/P in RIGHT ARM ONLY DSS/signatures Electronic Signature(s) Signed: 10/11/2020 10:34:15 AM By: Donnamarie Poag Entered ByDonnamarie Poag on 10/10/2020 09:17:44 Todd Brown, [name] ([id number]) -------------------------------------------------------------------------------- Clinic Level of Care Assessment Details Patient Name: Cotugno, Shad L. Date of Service: 10/10/2020 9:15 AM Medical Record Number: [id number] Patient Account Number: [id number] Date of Birth/Sex: November 12, 1960 (60 y.o. M) Treating RN: Dolan Amen Primary Care Breah Joa: Dorisann Frames Other Clinician: Referring Margretta Zamorano: Dorisann Frames Treating Annalena Piatt/Extender: [name] in Treatment: 14 Clinic Level of Care Assessment Items TOOL 1 Quantity Score '[]'$  - Use when EandM and Procedure is performed on INITIAL visit 0 ASSESSMENTS - Nursing Assessment / Reassessment '[]'$  - General Physical Exam (combine w/ comprehensive assessment (listed just below) when performed on  new 0 pt. evals) '[]'$  - 0 Comprehensive Assessment (HX, ROS, Risk Assessments, Wounds Hx, etc.) ASSESSMENTS - Wound and Skin Assessment / Reassessment '[]'$  - Dermatologic / Skin Assessment (not related to wound area) 0 ASSESSMENTS - Ostomy and/or Continence Assessment and Care '[]'$  - Incontinence Assessment and Management 0 '[]'$  - 0 Ostomy Care Assessment and Management (repouching, etc.) PROCESS - Coordination of Care '[]'$  - Simple Patient / Family Education for ongoing care 0 '[]'$  - 0 Complex (extensive) Patient / Family Education for ongoing care '[]'$  - 0 Staff obtains Programmer, systems, Records, Test Results / Process Orders '[]'$  - 0 Staff telephones HHA, Nursing Homes / Clarify orders / etc '[]'$  - 0 Routine Transfer to another Facility (non-emergent condition) '[]'$  - 0 Routine Hospital Admission (non-emergent condition) '[]'$  - 0 New Admissions / Biomedical engineer / Ordering NPWT, Apligraf, etc. '[]'$  - 0 Emergency Hospital Admission (emergent condition) PROCESS - Special Needs '[]'$  - Pediatric / Minor Patient Management 0 '[]'$  - 0 Isolation Patient Management '[]'$  - 0 Hearing / Language / Visual special needs '[]'$  - 0 Assessment of Community assistance (transportation, D/C planning, etc.) '[]'$  - 0 Additional assistance / Altered mentation '[]'$  - 0 Support Surface(s) Assessment (bed, cushion, seat, etc.) INTERVENTIONS - Miscellaneous '[]'$  - External ear exam 0 '[]'$  - 0 Patient Transfer (multiple staff / Civil Service fast streamer / Similar devices) '[]'$  - 0 Simple Staple / Suture removal (25 or less) '[]'$  - 0 Complex Staple / Suture removal (26 or more) '[]'$  - 0 Hypo/Hyperglycemic Management (do not check if billed separately) '[]'$  - 0 Ankle / Brachial Index (ABI) - do not check if billed separately Has the patient been seen at the hospital within the last three years: Yes Total Score: 0 Level Of Care: ____ Todd Brown, [name] ([id number]) Electronic Signature(s) Signed: 10/10/2020 5:06:40 PM By: Georges Mouse, Minus Breeding  RN Entered By: Georges Mouse, Minus Breeding on 10/10/2020 10:18:48 Todd Brown, [name] ([id number]) -------------------------------------------------------------------------------- Compression Therapy Details Patient Name: AGNEW, ZINCK  L. Date of Service: 10/10/2020 9:15 AM Medical Record Number: [id number] Patient Account Number: [id number] Date of Birth/Sex: [birth year] (60 y.o. M) Treating RN: Dolan Amen Primary Care Arianna Delsanto: Dorisann Frames Other Clinician: Referring Stephanye Finnicum: Dorisann Frames Treating Copper Kirtley/Extender: [name] in Treatment: 14 Compression Therapy Performed for Wound Assessment: Wound #1 Left,Anterior Lower Leg Performed By: Clinician Dolan Amen, RN Compression Type: Three Layer Post Procedure Diagnosis Same as Pre-procedure Notes pt tolerating wrap well Electronic Signature(s) Signed: 10/10/2020 5:06:40 PM By: Georges Mouse, Minus Breeding RN Entered By: Georges Mouse, Kenia on 10/10/2020 10:17:52 Todd Brown, [name] ([id number]) -------------------------------------------------------------------------------- Compression Therapy Details Patient Name: Todd Brown, [name] Date of Service: 10/10/2020 9:15 AM Medical Record Number: [id number] Patient Account Number: [id number] Date of Birth/Sex: Nov 30, 1960 (60 y.o. M) Treating RN: Dolan Amen Primary Care Chastin Riesgo: Dorisann Frames Other Clinician: Referring Corneluis Allston: Dorisann Frames Treating Jaiyah Beining/Extender: [name] in Treatment: 14 Compression Therapy Performed for Wound Assessment: Wound #2 Left,Medial Lower Leg Performed By: Clinician Dolan Amen, RN Compression Type: Three Layer Post Procedure Diagnosis Same as Pre-procedure Notes pt tolerating wrap well Electronic Signature(s) Signed: 10/10/2020 5:06:40 PM By: Georges Mouse, Minus Breeding RN Entered By: Georges Mouse, Kenia on 10/10/2020 10:17:52 Todd Brown, [name]  ([id number]) -------------------------------------------------------------------------------- Encounter Discharge Information Details Patient Name: Todd Brown, [name] Date of Service: 10/10/2020 9:15 AM Medical Record Number: [id number] Patient Account Number: [id number] Date of Birth/Sex: 01-Jun-1961 (60 y.o. M) Treating RN: Donnamarie Poag Primary Care Elpidia Karn: Dorisann Frames Other Clinician: Referring Miguel Christiana: Dorisann Frames Treating Detrell Umscheid/Extender: [name] in Treatment: 14 Encounter Discharge Information Items Discharge Condition: Stable Ambulatory Status: Wheelchair Discharge Destination: Other (Note Required) Telephoned: No Orders Sent: Yes Transportation: Other Accompanied By: caregiver Schedule Follow-up Appointment: Yes Clinical Summary of Care: Electronic Signature(s) Signed: 10/11/2020 10:34:15 AM By: Donnamarie Poag Entered By: Donnamarie Poag on 10/10/2020 10:33:45 Todd Brown, [name] ([id number]) -------------------------------------------------------------------------------- Lower Extremity Assessment Details Patient Name: Todd Brown, [name] Date of Service: 10/10/2020 9:15 AM Medical Record Number: [id number] Patient Account Number: [id number] Date of Birth/Sex: [birth year] (60 y.o. M) Treating RN: Donnamarie Poag Primary Care Terrianna Holsclaw: Dorisann Frames Other Clinician: Referring Abimael Zeiter: Dorisann Frames Treating Oretta Berkland/Extender: [name] in Treatment: 14 Edema Assessment Assessed: [Left: Yes] Patrice Paradise: No] [Left: Edema] [Right: :] Calf Left: Right: Point of Measurement: 39 cm From Medial Instep 32 cm Ankle Left: Right: Point of Measurement: 10 cm From Medial Instep 22 cm Knee To Floor Left: Right: From Medial Instep 44 cm Vascular Assessment Pulses: Dorsalis Pedis Palpable: [Left:Yes] Electronic Signature(s) Signed: 10/11/2020 10:34:15 AM By: Donnamarie Poag Entered ByDonnamarie Poag on 10/10/2020 09:26:35 Todd Brown, [name]  ([id number]) -------------------------------------------------------------------------------- Multi Wound Chart Details Patient Name: Todd Brown, [name] Date of Service: 10/10/2020 9:15 AM Medical Record Number: [id number] Patient Account Number: [id number] Date of Birth/Sex: Nov 03, 1960 (60 y.o. M) Treating RN: Dolan Amen Primary Care Rubie Ficco: Dorisann Frames Other Clinician: Referring Misty Rago: Dorisann Frames Treating Leianne Callins/Extender: [name] in Treatment: 14 Vital Signs Height(in): 75 Pulse(bpm): 34 Weight(lbs): 190 Blood Pressure(mmHg): 111/55 Body Mass Index(BMI): 24 Temperature(F): 98.4 Respiratory Rate(breaths/min): 18 Photos: [N/A:N/A] Wound Location: Left, Anterior Lower Leg N/A N/A Wounding Event: Laceration N/A N/A Primary Etiology: Trauma, Other N/A N/A Comorbid History: Chronic Obstructive Pulmonary N/A N/A Disease (COPD), Hypertension Date Acquired: 06/18/2020 N/A N/A Weeks of Treatment: 14 N/A N/A Wound Status: Open N/A N/A Measurements L x W x D (cm) 0.5x1x0.1 N/A N/A Area (cm) : 0.393 N/A N/A Volume (cm) : 0.039 N/A N/A % Reduction in Area: 99.30% N/A N/A % Reduction in Volume: 99.80%  N/A N/A Classification: Full Thickness Without Exposed N/A N/A Support Structures Exudate Amount: Medium N/A N/A Exudate Type: Serous N/A N/A Exudate Color: amber N/A N/A Wound Margin: Flat and Intact N/A N/A Granulation Amount: Small (1-33%) N/A N/A Granulation Quality: Red N/A N/A Necrotic Amount: Large (67-100%) N/A N/A Necrotic Tissue: Eschar N/A N/A Exposed Structures: Fat Layer (Subcutaneous Tissue): N/A N/A Yes Fascia: No Tendon: No Muscle: No Joint: No Bone: No Epithelialization: Medium (34-66%) N/A N/A Treatment Notes Electronic Signature(s) Signed: 10/10/2020 5:06:40 PM By: Georges Mouse, Minus Breeding RN Entered By: Georges Mouse, Minus Breeding on 10/10/2020 10:15:27 Todd Brown, Josephine L.  ([id number]) -------------------------------------------------------------------------------- Multi-Disciplinary Care Plan Details Patient Name: Brix, Jahiem L. Date of Service: 10/10/2020 9:15 AM Medical Record Number: [id number] Patient Account Number: [id number] Date of Birth/Sex: 12-17-1960 (60 y.o. M) Treating RN: Dolan Amen Primary Care Conlee Sliter: Dorisann Frames Other Clinician: Referring Travian Kerner: Dorisann Frames Treating Zyniah Ferraiolo/Extender: [name] in Treatment: 14 Active Inactive Electronic Signature(s) Signed: 10/10/2020 5:06:40 PM By: Georges Mouse, Minus Breeding RN Entered By: Georges Mouse, Minus Breeding on 10/10/2020 10:15:21 Villada, Shed L. ([id number]) -------------------------------------------------------------------------------- Pain Assessment Details Patient Name: Coalson, Gaelen L. Date of Service: 10/10/2020 9:15 AM Medical Record Number: [id number] Patient Account Number: [id number] Date of Birth/Sex: [birth year] (60 y.o. M) Treating RN: Donnamarie Poag Primary Care Rome Schlauch: Dorisann Frames Other Clinician: Referring Deajah Erkkila: Dorisann Frames Treating Chaniah Cisse/Extender: [name] in Treatment: 14 Active Problems Location of Pain Severity and Description of Pain Patient Has Paino No Site Locations Rate the pain. Current Pain Level: 0 Pain Management and Medication Current Pain Management: Electronic Signature(s) Signed: 10/11/2020 10:34:15 AM By: Donnamarie Poag Entered By: Donnamarie Poag on 10/10/2020 09:20:05 Russett, Kalev L. ([id number]) -------------------------------------------------------------------------------- Patient/Caregiver Education Details Patient Name: Venneman, Sebastian L. Date of Service: 10/10/2020 9:15 AM Medical Record Number: [id number] Patient Account Number: [id number] Date of Birth/Gender: 04/15/1961 (60 y.o. M) Treating RN: Dolan Amen Primary Care Physician: Dorisann Frames Other Clinician: Referring Physician: Dorisann Frames Treating Physician/Extender: [name] in Treatment: 14 Education Assessment Education Provided To: Patient Education Topics Provided Wound/Skin Impairment: Methods: Explain/Verbal Responses: State content correctly Electronic Signature(s) Signed: 10/10/2020 5:06:40 PM By: Georges Mouse, Minus Breeding RN Entered By: Georges Mouse, Minus Breeding on 10/10/2020 10:19:02 Capes, Leandre L. ([id number]) -------------------------------------------------------------------------------- Wound Assessment Details Patient Name: Traughber, Mana L. Date of Service: 10/10/2020 9:15 AM Medical Record Number: [id number] Patient Account Number: [id number] Date of Birth/Sex: 10/17/1960 (60 y.o. M) Treating RN: Donnamarie Poag Primary Care Wilhemenia Camba: Dorisann Frames Other Clinician: Referring Majed Pellegrin: Dorisann Frames Treating Lynda Capistran/Extender: [name] in Treatment: 14 Wound Status Wound Number: 1 Primary Trauma, Other Etiology: Wound Location: Left, Anterior Lower Leg Wound Status: Open Wounding Event: Laceration Comorbid Chronic Obstructive Pulmonary Disease (COPD), Date Acquired: 06/18/2020 History: Hypertension Weeks Of Treatment: 14 Clustered Wound: No Photos Wound Measurements Length: (cm) 0.5 Width: (cm) 1 Depth: (cm) 0.1 Area: (cm) 0.393 Volume: (cm) 0.039 % Reduction in Area: 99.3% % Reduction in Volume: 99.8% Epithelialization: Medium (34-66%) Wound Description Classification: Full Thickness Without Exposed Support Structu Wound Margin: Flat and Intact Exudate Amount: Medium Exudate Type: Serous Exudate Color: amber res Foul Odor After Cleansing: No Slough/Fibrino No Wound Bed Granulation Amount: Small (1-33%) Exposed Structure Granulation Quality: Red Fascia Exposed: No Necrotic Amount: Large (67-100%) Fat Layer (Subcutaneous Tissue) Exposed: Yes Necrotic Quality: Eschar Tendon Exposed: No Muscle Exposed: No Joint Exposed: No Bone Exposed: No Treatment  Notes Wound #1 (Lower Leg) Wound Laterality: Left, Anterior Cleanser Soap and Water Discharge Instruction: Gently cleanse wound with antibacterial soap, rinse and pat  dry prior to dressing wounds Peri-Wound Care Vandenbrink, Jalil L. ([id number]) Topical Primary Dressing Hydrofera Blue Ready Transfer Foam, 2.5x2.5 (in/in) Discharge Instruction: Apply Hydrofera Blue Ready to wound bed as directed Secondary Dressing ABD Pad 5x9 (in/in) Discharge Instruction: Cover with ABD pad Secured With Compression Wrap Profore Lite LF 3 Multilayer Compression [place] Discharge Instruction: Apply 3 multi-layer wrap as prescribed. Compression Stockings Add-Ons Electronic Signature(s) Signed: 10/11/2020 10:34:15 AM By: Donnamarie Poag Entered By: Donnamarie Poag on 10/10/2020 09:25:12 Freeman, Alven L. ([id number]) -------------------------------------------------------------------------------- Wound Assessment Details Patient Name: Petkus, Marvelle L. Date of Service: 10/10/2020 9:15 AM Medical Record Number: [id number] Patient Account Number: [id number] Date of Birth/Sex: 03/28/1961 (60 y.o. M) Treating RN: Dolan Amen Primary Care Adreana Coull: Dorisann Frames Other Clinician: Referring Fiza Nation: Dorisann Frames Treating Jared Cahn/Extender: [name] in Treatment: 14 Wound Status Wound Number: 2 Primary Venous Leg Ulcer Etiology: Wound Location: Left, Medial Lower Leg Wound Status: Open Wounding Event: Gradually Appeared Comorbid Chronic Obstructive Pulmonary Disease (COPD), Date Acquired: 10/10/2020 History: Hypertension Weeks Of Treatment: 0 Clustered Wound: No Wound Measurements Length: (cm) 1.3 Width: (cm) 0.7 Depth: (cm) 0.1 Area: (cm) 0.715 Volume: (cm) 0.071 % Reduction in Area: 0% % Reduction in Volume: 0% Epithelialization: None Tunneling: No Undermining: No Wound Description Classification: Full Thickness Without Exposed Support Structures Exudate Amount:  Medium Exudate Type: Serous Exudate Color: amber Foul Odor After Cleansing: No Slough/Fibrino No Wound Bed Granulation Amount: Large (67-100%) Exposed Structure Granulation Quality: Red, Pink Fascia Exposed: No Necrotic Amount: None Present (0%) Fat Layer (Subcutaneous Tissue) Exposed: Yes Tendon Exposed: No Muscle Exposed: No Joint Exposed: No Bone Exposed: No Treatment Notes Wound #2 (Lower Leg) Wound Laterality: Left, Medial Cleanser Soap and Water Discharge Instruction: Gently cleanse wound with antibacterial soap, rinse and pat dry prior to dressing wounds Peri-Wound Care Topical Primary Dressing Hydrofera Blue Ready Transfer Foam, 2.5x2.5 (in/in) Discharge Instruction: Apply Hydrofera Blue Ready to wound bed as directed Secondary Dressing ABD Pad 5x9 (in/in) Discharge Instruction: Cover with ABD pad Secured With Compression Wrap Profore Lite LF 3 Multilayer Compression Bandaging System Discharge Instruction: Apply 3 multi-layer wrap as prescribed. Mcglade, Reese L. ([id number]) Compression Stockings Add-Ons Electronic Signature(s) Signed: 10/10/2020 5:06:40 PM By: Georges Mouse, Minus Breeding RN Entered By: Georges Mouse, Minus Breeding on 10/10/2020 10:17:26 Polzin, Kaceton L. ([id number]) -------------------------------------------------------------------------------- Vitals Details Patient Name: Jablonski, Adeyemi L. Date of Service: 10/10/2020 9:15 AM Medical Record Number: [id number] Patient Account Number: [id number] Date of Birth/Sex: [birth year] (60 y.o. M) Treating RN: Donnamarie Poag Primary Care Marijke Guadiana: Dorisann Frames Other Clinician: Referring Ajanae Virag: Dorisann Frames Treating Melvenia Favela/Extender: [name] in Treatment: 14 Vital Signs Time Taken: 09:18 Temperature (F): 98.4 Height (in): 75 Pulse (bpm): 78 Weight (lbs): 190 Respiratory Rate (breaths/min): 18 Body Mass Index (BMI): 23.7 Blood Pressure (mmHg): 111/55 Reference Range: 80 - 120 mg /  dl Electronic Signature(s) Signed: 10/11/2020 10:34:15 AM By: Donnamarie Poag Entered ByDonnamarie Poag on 10/10/2020 09:19:39

## 2020-10-31 ENCOUNTER — Encounter: Payer: Medicaid Other | Attending: Physician Assistant | Admitting: Physician Assistant

## 2020-10-31 ENCOUNTER — Other Ambulatory Visit: Payer: Self-pay

## 2020-10-31 DIAGNOSIS — F2089 Other schizophrenia: Secondary | ICD-10-CM | POA: Insufficient documentation

## 2020-10-31 DIAGNOSIS — W1849XA Other slipping, tripping and stumbling without falling, initial encounter: Secondary | ICD-10-CM | POA: Diagnosis not present

## 2020-10-31 DIAGNOSIS — Y92199 Unspecified place in other specified residential institution as the place of occurrence of the external cause: Secondary | ICD-10-CM | POA: Diagnosis not present

## 2020-10-31 DIAGNOSIS — N186 End stage renal disease: Secondary | ICD-10-CM | POA: Diagnosis not present

## 2020-10-31 DIAGNOSIS — L97822 Non-pressure chronic ulcer of other part of left lower leg with fat layer exposed: Secondary | ICD-10-CM | POA: Insufficient documentation

## 2020-10-31 DIAGNOSIS — S81802A Unspecified open wound, left lower leg, initial encounter: Secondary | ICD-10-CM | POA: Diagnosis present

## 2020-10-31 DIAGNOSIS — Z992 Dependence on renal dialysis: Secondary | ICD-10-CM | POA: Diagnosis not present

## 2020-10-31 NOTE — Progress Notes (Signed)
Bethune, Cayetano L. ([id number]) Visit Report for 10/31/2020 Arrival Information Details Patient Name: Todd Brown, [name] Date of Service: 10/31/2020 10:00 AM Medical Record Number: [id number] Patient Account Number: [id number] Date of Birth/Sex: 04/10/1961 (60 y.o. M) Treating Brown: Todd Brown Primary Care Todd Brown: Todd Brown Other Clinician: Referring Todd Brown: Todd Brown Treating Todd Brown/Extender: Todd Brown in Treatment: 52 Visit Information History Since Last Visit All ordered tests and consults were completed: No Patient Arrived: Wheel Chair Added or deleted any medications: No Arrival Time: 09:53 Any new allergies or adverse reactions: No Accompanied By: self Had a fall or experienced change in No Transfer Assistance: None activities of daily living that may affect Patient Identification Verified: Yes risk of falls: Secondary Verification Process Completed: Yes Signs or symptoms of abuse/neglect since last visito No Patient Requires Transmission-Based No Hospitalized since last visit: No Precautions: Implantable device outside of the clinic excluding No Patient Has Alerts: Yes cellular tissue based products placed in the center Patient Alerts: 07/25/2020 ABI left since last visit: Todd Brown>220 Has Dressing in Place as Prescribed: Yes B/P in RIGHT ARM Pain Present Now: No ONLY DSS/signatures Electronic Signature(s) Signed: 10/31/2020 4:50:12 PM By: Todd Coria Brown Entered By: Todd Brown on 10/31/2020 09:59:44 Todd Brown, [name] ([id number]) -------------------------------------------------------------------------------- Clinic Level of Care Assessment Details Patient Name: Todd Brown, [name] Date of Service: 10/31/2020 10:00 AM Medical Record Number: [id number] Patient Account Number: [id number] Date of Birth/Sex: 14-Dec-1960 (60 y.o. M) Treating Brown: Todd Brown Primary Care Zi Sek: Todd Brown Other Clinician: Referring Damarian Priola: Todd Brown Treating Vasiliy Mccarry/Extender: Todd Brown in Treatment: 17 Clinic Level of Care Assessment Items TOOL 4 Quantity Score X - Use when only an EandM is performed on FOLLOW-UP visit 1 0 ASSESSMENTS - Nursing Assessment / Reassessment X - Reassessment of Co-morbidities (includes updates in patient status) 1 10 X- 1 5 Reassessment of Adherence to Treatment Plan ASSESSMENTS - Wound and Skin Assessment / Reassessment '[]'$  - Simple Wound Assessment / Reassessment - one wound 0 '[]'$  - 0 Complex Wound Assessment / Reassessment - multiple wounds '[]'$  - 0 Dermatologic / Skin Assessment (not related to wound area) ASSESSMENTS - Focused Assessment '[]'$  - Circumferential Edema Measurements - multi extremities 0 '[]'$  - 0 Nutritional Assessment / Counseling / Intervention '[]'$  - 0 Lower Extremity Assessment (monofilament, tuning fork, pulses) '[]'$  - 0 Peripheral Arterial Disease Assessment (using hand held doppler) ASSESSMENTS - Ostomy and/or Continence Assessment and Care '[]'$  - Incontinence Assessment and Management 0 '[]'$  - 0 Ostomy Care Assessment and Management (repouching, etc.) PROCESS - Coordination of Care X - Simple Patient / Family Education for ongoing care 1 15 '[]'$  - 0 Complex (extensive) Patient / Family Education for ongoing care '[]'$  - 0 Staff obtains Programmer, systems, Records, Test Results / Process Orders '[]'$  - 0 Staff telephones HHA, Nursing Homes / Clarify orders / etc '[]'$  - 0 Routine Transfer to another Facility (non-emergent condition) '[]'$  - 0 Routine Hospital Admission (non-emergent condition) '[]'$  - 0 New Admissions / Biomedical engineer / Ordering NPWT, Apligraf, etc. '[]'$  - 0 Emergency Hospital Admission (emergent condition) X- 1 10 Simple Discharge Coordination '[]'$  - 0 Complex (extensive) Discharge Coordination PROCESS - Special Needs '[]'$  - Pediatric / Minor Patient Management 0 '[]'$  - 0 Isolation Patient Management '[]'$  - 0 Hearing / Language / Visual special needs '[]'$  -  0 Assessment of Community assistance (transportation, D/C planning, etc.) '[]'$  - 0 Additional assistance / Altered mentation '[]'$  - 0 Support Surface(s) Assessment (bed, cushion, seat, etc.) INTERVENTIONS - Wound Cleansing /  Measurement [name], [name] ([id number]) '[]'$  - 0 Simple Wound Cleansing - one wound '[]'$  - 0 Complex Wound Cleansing - multiple wounds '[]'$  - 0 Wound Imaging (photographs - any number of wounds) '[]'$  - 0 Wound Tracing (instead of photographs) '[]'$  - 0 Simple Wound Measurement - one wound '[]'$  - 0 Complex Wound Measurement - multiple wounds INTERVENTIONS - Wound Dressings '[]'$  - Small Wound Dressing one or multiple wounds 0 '[]'$  - 0 Medium Wound Dressing one or multiple wounds '[]'$  - 0 Large Wound Dressing one or multiple wounds '[]'$  - 0 Application of Medications - topical '[]'$  - 0 Application of Medications - injection INTERVENTIONS - Miscellaneous '[]'$  - External ear exam 0 '[]'$  - 0 Specimen Collection (cultures, biopsies, blood, body fluids, etc.) '[]'$  - 0 Specimen(s) / Culture(s) sent or taken to Lab for analysis '[]'$  - 0 Patient Transfer (multiple staff / Civil Service fast streamer / Similar devices) '[]'$  - 0 Simple Staple / Suture removal (25 or less) '[]'$  - 0 Complex Staple / Suture removal (26 or more) '[]'$  - 0 Hypo / Hyperglycemic Management (close monitor of Blood Glucose) '[]'$  - 0 Ankle / Brachial Index (ABI) - do not check if billed separately X- 1 5 Vital Signs Has the patient been seen at the hospital within the last three years: Yes Total Score: 45 Level Of Care: New/Established - Level 2 Electronic Signature(s) Signed: 10/31/2020 5:11:22 PM By: Todd Brown, Todd Brown Entered By: Todd Brown, Todd Breeding on 10/31/2020 10:40:01 Todd Brown, [name] ([id number]) -------------------------------------------------------------------------------- Encounter Discharge Information Details Patient Name: [name], [name] Date of Service: 10/31/2020 10:00 AM Medical Record Number:  [id number] Patient Account Number: [id number] Date of Birth/Sex: 1961/01/10 (60 y.o. M) Treating Brown: Todd Brown Primary Care Elianys Conry: Todd Brown Other Clinician: Referring Pravin Perezperez: Todd Brown Treating Tessia Kassin/Extender: Todd Brown in Treatment: 17 Encounter Discharge Information Items Discharge Condition: Stable Ambulatory Status: Wheelchair Discharge Destination: Home Transportation: Private Auto Accompanied By: caregiver Schedule Follow-up Appointment: No Clinical Summary of Care: Electronic Signature(s) Signed: 10/31/2020 5:11:22 PM By: Todd Brown, Todd Brown Entered By: Todd Brown, Todd Breeding on 10/31/2020 11:22:37 Todd Brown, [name] ([id number]) -------------------------------------------------------------------------------- Lower Extremity Assessment Details Patient Name: Todd Brown, [name] Date of Service: 10/31/2020 10:00 AM Medical Record Number: [id number] Patient Account Number: [id number] Date of Birth/Sex: [birth year] (60 y.o. M) Treating Brown: Todd Brown Primary Care Kyng Matlock: Todd Brown Other Clinician: Referring Chonda Baney: Todd Brown Treating Wilberth Damon/Extender: Todd Brown in Treatment: 17 Edema Assessment Assessed: [Left: No] Patrice Paradise: No] [Left: Edema] [Right: :] Calf Left: Right: Point of Measurement: 39 cm From Medial Instep 30 cm Ankle Left: Right: Point of Measurement: 10 cm From Medial Instep 21 cm Vascular Assessment Pulses: Dorsalis Pedis Palpable: [Left:Yes] Electronic Signature(s) Signed: 10/31/2020 4:50:12 PM By: Todd Coria Brown Entered By: Todd Brown on 10/31/2020 10:07:29 Sabatino, Mclain L. ([id number]) -------------------------------------------------------------------------------- Multi Wound Chart Details Patient Name: Todd Brown, [name] Date of Service: 10/31/2020 10:00 AM Medical Record Number: [id number] Patient Account Number: [id number] Date of Birth/Sex: [birth year] (60 y.o. M) Treating Brown: Todd Brown Primary Care Saif Peter: Todd Brown Other Clinician: Referring Maryanne Huneycutt: Todd Brown Treating Amorette Charrette/Extender: Todd Brown in Treatment: 17 Vital Signs Height(in): 28 Pulse(bpm): 39 Weight(lbs): 190 Blood Pressure(mmHg): 90/47 Body Mass Index(BMI): 24 Temperature(F): 98.5 Respiratory Rate(breaths/min): 18 Photos: [2:No Photos] [N/A:N/A] Wound Location: Left, Anterior Lower Leg Left, Medial Lower Leg N/A Wounding Event: Laceration Gradually Appeared N/A Primary Etiology: Trauma, Other Venous Leg Ulcer N/A Comorbid History: Chronic Obstructive Pulmonary Chronic Obstructive Pulmonary N/A Disease (COPD), Hypertension Disease (COPD), Hypertension  Date Acquired: 06/18/2020 10/10/2020 N/A Weeks of Treatment: 17 3 N/A Wound Status: Healed - Epithelialized Open N/A Measurements L x W x D (cm) 0x0x0 0x0x0 N/A Area (cm) : 0 0 N/A Volume (cm) : 0 0 N/A % Reduction in Area: 100.00% 100.00% N/A % Reduction in Volume: 100.00% 100.00% N/A Classification: Full Thickness Without Exposed Full Thickness Without Exposed N/A Support Structures Support Structures Exudate Amount: None Present None Present N/A Wound Margin: Flat and Intact N/A N/A Granulation Amount: None Present (0%) None Present (0%) N/A Necrotic Amount: None Present (0%) None Present (0%) N/A Exposed Structures: Fascia: No Fascia: No N/A Fat Layer (Subcutaneous Tissue): Fat Layer (Subcutaneous Tissue): No No Tendon: No Tendon: No Muscle: No Muscle: No Joint: No Joint: No Bone: No Bone: No Epithelialization: Large (67-100%) Large (67-100%) N/A Treatment Notes Electronic Signature(s) Signed: 10/31/2020 5:11:22 PM By: Todd Brown, Todd Brown Entered By: Todd Brown, Todd Breeding on 10/31/2020 10:38:36 Todd Brown, [name] ([id number]) -------------------------------------------------------------------------------- Multi-Disciplinary Care Plan Details Patient Name: Milbrath, Montrelle L. Date of Service:  10/31/2020 10:00 AM Medical Record Number: [id number] Patient Account Number: [id number] Date of Birth/Sex: [birth year] (60 y.o. M) Treating Brown: Todd Brown Primary Care Kemara Quigley: Todd Brown Other Clinician: Referring Phyllicia Dudek: Todd Brown Treating Landy Dunnavant/Extender: Todd Brown in Treatment: 17 Active Inactive Electronic Signature(s) Signed: 10/31/2020 5:11:22 PM By: Todd Brown, Todd Brown Entered By: Todd Brown, Todd Breeding on 10/31/2020 10:38:28 Todd Brown, [name] ([id number]) -------------------------------------------------------------------------------- Pain Assessment Details Patient Name: Todd Brown, [name] Date of Service: 10/31/2020 10:00 AM Medical Record Number: [id number] Patient Account Number: [id number] Date of Birth/Sex: 29-Jan-1961 (60 y.o. M) Treating Brown: Todd Brown Primary Care Bennetta Rudden: Todd Brown Other Clinician: Referring Anniebelle Devore: Todd Brown Treating Cole Eastridge/Extender: Todd Brown in Treatment: 17 Active Problems Location of Pain Severity and Description of Pain Patient Has Paino No Site Locations Pain Management and Medication Current Pain Management: Electronic Signature(s) Signed: 10/31/2020 4:50:12 PM By: Todd Coria Brown Entered By: Todd Brown on 10/31/2020 10:00:20 Todd Brown, [name] ([id number]) -------------------------------------------------------------------------------- Patient/Caregiver Education Details Patient Name: Vassell, Powell L. Date of Service: 10/31/2020 10:00 AM Medical Record Number: [id number] Patient Account Number: [id number] Date of Birth/Gender: 1960/06/26 (60 y.o. M) Treating Brown: Todd Brown Primary Care Physician: Todd Brown Other Clinician: Referring Physician: Dorisann Brown Treating Physician/Extender: Todd Brown in Treatment: 17 Education Assessment Education Provided To: Caregiver Education Topics Provided Notes discharge instructions Electronic  Signature(s) Signed: 10/31/2020 5:11:22 PM By: Todd Brown, Todd Brown Entered By: Todd Brown, Todd Breeding on 10/31/2020 10:40:24 Todd Brown, [name] ([id number]) -------------------------------------------------------------------------------- Wound Assessment Details Patient Name: Todd Brown, [name] Date of Service: 10/31/2020 10:00 AM Medical Record Number: [id number] Patient Account Number: [id number] Date of Birth/Sex: 25-Nov-1960 (60 y.o. M) Treating Brown: Todd Brown Primary Care Garrick Midgley: Todd Brown Other Clinician: Referring Swayzie Choate: Todd Brown Treating Mouhamad Teed/Extender: Todd Brown in Treatment: 17 Wound Status Wound Number: 1 Primary Trauma, Other Etiology: Wound Location: Left, Anterior Lower Leg Wound Status: Healed - Epithelialized Wounding Event: Laceration Comorbid Chronic Obstructive Pulmonary Disease (COPD), Date Acquired: 06/18/2020 History: Hypertension Weeks Of Treatment: 17 Clustered Wound: No Photos Wound Measurements Length: (cm) 0 Width: (cm) 0 Depth: (cm) 0 Area: (cm) 0 Volume: (cm) 0 % Reduction in Area: 100% % Reduction in Volume: 100% Epithelialization: Large (67-100%) Tunneling: No Undermining: No Wound Description Classification: Full Thickness Without Exposed Support Structure Wound Margin: Flat and Intact Exudate Amount: None Present s Foul Odor After Cleansing: No Slough/Fibrino No Wound Bed Granulation Amount: None Present (0%) Exposed Structure Necrotic Amount: None Present (0%) Fascia  Exposed: No Fat Layer (Subcutaneous Tissue) Exposed: No Tendon Exposed: No Muscle Exposed: No Joint Exposed: No Bone Exposed: No Electronic Signature(s) Signed: 10/31/2020 4:50:12 PM By: Todd Coria Brown Signed: 10/31/2020 5:11:22 PM By: Todd Brown, Todd Brown Entered By: Todd Brown, Todd Breeding on 10/31/2020 10:38:18 Todd Brown, Zaedyn L. ([id number]) -------------------------------------------------------------------------------- Wound  Assessment Details Patient Name: Holzheimer, Tyrik L. Date of Service: 10/31/2020 10:00 AM Medical Record Number: [id number] Patient Account Number: [id number] Date of Birth/Sex: 1961/02/14 (60 y.o. M) Treating Brown: Todd Brown Primary Care Alizzon Dioguardi: Todd Brown Other Clinician: Referring Nagi Furio: Todd Brown Treating Soraya Paquette/Extender: Todd Brown in Treatment: 17 Wound Status Wound Number: 2 Primary Venous Leg Ulcer Etiology: Wound Location: Left, Medial Lower Leg Wound Status: Open Wounding Event: Gradually Appeared Comorbid Chronic Obstructive Pulmonary Disease (COPD), Date Acquired: 10/10/2020 History: Hypertension Weeks Of Treatment: 3 Clustered Wound: No Wound Measurements Length: (cm) 0 Width: (cm) 0 Depth: (cm) 0 Area: (cm) Volume: (cm) % Reduction in Area: 100% % Reduction in Volume: 100% Epithelialization: Large (67-100%) 0 Tunneling: No 0 Undermining: No Wound Description Classification: Full Thickness Without Exposed Support Structure Exudate Amount: None Present s Foul Odor After Cleansing: No Slough/Fibrino No Wound Bed Granulation Amount: None Present (0%) Exposed Structure Necrotic Amount: None Present (0%) Fascia Exposed: No Fat Layer (Subcutaneous Tissue) Exposed: No Tendon Exposed: No Muscle Exposed: No Joint Exposed: No Bone Exposed: No Electronic Signature(s) Signed: 10/31/2020 4:50:12 PM By: Todd Coria Brown Entered By: Todd Brown on 10/31/2020 10:06:23 Botto, Deavion L. ([id number]) -------------------------------------------------------------------------------- Vitals Details Patient Name: Carrizales, Camrin L. Date of Service: 10/31/2020 10:00 AM Medical Record Number: [id number] Patient Account Number: [id number] Date of Birth/Sex: 1961/03/11 (60 y.o. M) Treating Brown: Todd Brown Primary Care Aleli Navedo: Todd Brown Other Clinician: Referring Anthoni Geerts: Todd Brown Treating Mckinzey Entwistle/Extender: Todd Brown in  Treatment: 17 Vital Signs Time Taken: 09:59 Temperature (F): 98.5 Height (in): 75 Pulse (bpm): 78 Weight (lbs): 190 Respiratory Rate (breaths/min): 18 Body Mass Index (BMI): 23.7 Blood Pressure (mmHg): 90/47 Reference Range: 80 - 120 mg / dl Electronic Signature(s) Signed: 10/31/2020 4:50:12 PM By: Todd Coria Brown Entered By: Todd Brown on 10/31/2020 10:00:12

## 2020-10-31 NOTE — Progress Notes (Addendum)
Diltz, Josh Brown. ([id number]) Visit Report for 10/31/2020 Chief Complaint Document Details Patient Name: Todd Brown, Todd Brown. Date of Service: 10/31/2020 10:00 AM Medical Record Number: [id number] Patient Account Number: [id number] Date of Birth/Sex: 1961/01/28 (60 y.o. M) Treating RN: Dolan Amen Primary Care Provider: Dorisann Frames Other Clinician: Referring Provider: Dorisann Frames Treating Provider/Extender: [name] in Treatment: 17 Information Obtained from: Patient Chief Complaint Left LE Traumatic Ulcer Electronic Signature(s) Signed: 10/31/2020 10:26:30 AM By: Worthy Keeler PA-C Entered By: Worthy Keeler on 10/31/2020 10:26:30 Todd Brown, Todd Brown. ([id number]) -------------------------------------------------------------------------------- HPI Details Patient Name: Todd Brown, Todd Brown. Date of Service: 10/31/2020 10:00 AM Medical Record Number: [id number] Patient Account Number: [id number] Date of Birth/Sex: 03-13-1961 (60 y.o. M) Treating RN: Dolan Amen Primary Care Provider: Dorisann Frames Other Clinician: Referring Provider: Dorisann Frames Treating Provider/Extender: [name] in Treatment: 17 History of Present Illness HPI Description: 07/04/2020 patient presents today for initial evaluation here in our clinic. He is actually a ward of the state. With that being said he is only been in the group home that he is currently staying in for the past few weeks in fact it was only about 2 weeks. He was seen in the ED for evaluation on 06/18/2020 due to an injury that he sustained to his left lower leg. He tells me that he got up in the middle the night to go to the bathroom or something and tripped over a wheelchair causing a significant skin tear/laceration to the leg. Unfortunately this was sutured but has dehisced in some areas and appears to have a significant hematoma and others. The proximal portion of the wound has actually necrosis and the skin flap  is can have to be removed. The inferior portion appears to still be healthy and has reattached to some degree along the suture line this is still somewhat dehiscing and is can require some sharp debridement. Fortunately there does not appear to be any signs of active infection at this time. With that being said I do believe that we may want to put him back on some preventative antibiotics, Keflex, just to ensure following the debridement today that he is going to be infection free. Released to the best of our ability. The patient does have a history of end-stage renal disease that he is on dialysis. He also has schizophrenia at this point is now a ward of the state as it was deemed that he was not competent to be able to completely care for himself. Apparently his living condition prior to going to the group home was very poor. With that being said he seems to be doing much better now in my opinion and I do feel like that the location is that is a very good facility. The patient is going require some sharp debridement today and we did talk to [name] who is at the [facility] to gain approval for the patient. This was for debridement in particular. We did gain that approval and it was myself in [place] who witnessed this. Upon evaluation today patient appears to be doing a little better in regard to his wound currently. Fortunately there is no signs of active infection at this time. No fevers, chills, nausea, vomiting, or diarrhea. He has been taking the Keflex without complication. With that being said I am concerned today that he did develop a blood clot under the skin flap that skin to prevent him healing. I think we need to do something to try  to prevent this from continuing to be an issue. For that reason I discussed with him the possibility of doing a Unna boot wrap which I think it was going to be the best thing for him as far as healing is concerned he is in agreement  with the plan. 07/18/2020. Upon evaluation today patient's wound bed actually showed signs of improvement compared to previous evaluation. I feel like he is granulating in and does seem to be healing quite nicely. Fortunately there is no evidence of active infection which is great news and overall I think that he is headed in the right direction. 07/25/2020 upon evaluation today patient's wound actually appears to show signs of good improvement. I am actually very pleased with where we stand today. He is actually showing some signs of hypergranulation and I think he could benefit from switching to [facility]. That was discussed with the patient to let them know what was going on today as well. 08/01/2020 upon evaluation today patient appears to be doing excellent in regard to his wound on his leg. Fortunately there is no signs of active infection at this time. No fevers, chills, nausea, vomiting, or diarrhea. 08/15/2020 on evaluation today patient appears to be doing well with regard to his leg ulcer. This is showing signs of excellent improvement overall I am extremely pleased with where we stand he is definitely significantly smaller. 08/29/2020 upon evaluation today patient appears to be doing excellent in regard to his wound. Is been tolerating the dressing changes without complication. Fortunately I think that the patient is making excellent progress and in general I am extremely pleased with where things stand today. 09/12/2020 upon evaluation today patient appears to be doing well with regard to his wound. In fact this is showing signs of doing excellent as measuring significantly smaller and seems to overall be making great progress. I am extremely pleased with where we stand today. Overall I do not see any evidence of infection which is great news as well. 4/28; 2-week follow-up visit. The patient has home health changing his dressing. This was initially traumatic wound on the left anterior  lower leg which was large plus or minus a hematoma. In any case it is now down to a very small healthy looking area in the superior part of the original wound we have been using Hydrofera Blue under compression 10/10/2020 upon evaluation today patient's wound is almost completely healed. He does have a small blister just lateral to where the actual wound opening is here today but again this is nothing too significant which is great news. Overall I am extremely pleased with where he stands at this point. No fevers, chills, nausea, vomiting, or diarrhea. 10/31/2020 upon evaluation today patient appears to be doing excellent in fact he is completely healed based on what I see. It is going to be however of utmost importance to keep the edema under control so this does not attempt to reopen. This was discussed with the patient and his caregiver today as well. Electronic Signature(s) Signed: 11/01/2020 7:47:25 AM By: Worthy Keeler PA-C Entered By: Worthy Keeler on 11/01/2020 07:47:25 Todd Brown, Todd Brown. ([id number]) -------------------------------------------------------------------------------- Physical Exam Details Patient Name: Dobler, Tahmir Brown. Date of Service: 10/31/2020 10:00 AM Medical Record Number: [id number] Patient Account Number: [id number] Date of Birth/Sex: Oct 09, 1960 (60 y.o. M) Treating RN: Dolan Amen Primary Care Provider: Dorisann Frames Other Clinician: Referring Provider: Dorisann Frames Treating Provider/Extender: [name] in Treatment: 61 Constitutional Well-nourished and well-hydrated in no  acute distress. Respiratory normal breathing without difficulty. Psychiatric this patient is able to make decisions and demonstrates good insight into disease process. Alert and Oriented x 3. pleasant and cooperative. Notes Upon inspection patient's wound again shows complete epithelization I am very pleased in this regard I know the patient is extremely happy as well.  There is no evidence whatsoever of infection and I think as long as we keep the edema under control he will be in good shape. Electronic Signature(s) Signed: 11/01/2020 7:47:43 AM By: Worthy Keeler PA-C Entered By: Worthy Keeler on 11/01/2020 07:47:42 Todd Brown, Todd Brown. ([id number]) -------------------------------------------------------------------------------- Physician Orders Details Patient Name: Todd Brown, Todd Brown. Date of Service: 10/31/2020 10:00 AM Medical Record Number: [id number] Patient Account Number: [id number] Date of Birth/Sex: 03/24/1961 (60 y.o. M) Treating RN: Dolan Amen Primary Care Provider: Dorisann Frames Other Clinician: Referring Provider: Dorisann Frames Treating Provider/Extender: [name] in Treatment: 17 Verbal / Phone Orders: No Diagnosis Coding ICD-10 Coding Code Description [phone] Unspecified open wound, left lower leg, initial encounter 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 layer exposed N18.6 End stage renal disease Z99.2 Dependence on renal dialysis F20.89 Other schizophrenia Discharge From [facility] o Discharge from [facility] Treatment Complete - Double layer tubi grip E-extra sent with patient o Wear compression garments daily. Put garments on first thing when you wake up and remove them before bed. Electronic Signature(s) Signed: 10/31/2020 5:11:22 PM By: Georges Mouse, Minus Breeding RN Signed: 11/01/2020 8:18:42 AM By: Worthy Keeler PA-C Entered By: Georges Mouse, Minus Breeding on 10/31/2020 10:39:46 Todd Brown, Todd Brown. ([id number]) -------------------------------------------------------------------------------- Problem List Details Patient Name: Todd Brown, Todd Brown. Date of Service: 10/31/2020 10:00 AM Medical Record Number: [id number] Patient Account Number: [id number] Date of Birth/Sex: 04-07-1961 (60 y.o. M) Treating RN: Dolan Amen Primary Care Provider: Dorisann Frames Other  Clinician: Referring Provider: Dorisann Frames Treating Provider/Extender: [name] in Treatment: 17 Active Problems ICD-10 Encounter Code Description Active Date MDM Diagnosis S81.802A Unspecified open wound, left lower leg, initial encounter 07/04/2020 No Yes 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 layer 07/04/2020 No Yes exposed N18.6 End stage renal disease 07/04/2020 No Yes Z99.2 Dependence on renal dialysis 07/04/2020 No Yes F20.89 Other schizophrenia 07/04/2020 No Yes Inactive Problems Resolved Problems Electronic Signature(s) Signed: 10/31/2020 10:26:24 AM By: Worthy Keeler PA-C Entered By: Worthy Keeler on 10/31/2020 10:26:24 Todd Brown, Todd Brown. ([id number]) -------------------------------------------------------------------------------- Progress Note Details Patient Name: Todd Brown, Todd Brown. Date of Service: 10/31/2020 10:00 AM Medical Record Number: [id number] Patient Account Number: [id number] Date of Birth/Sex: [birth year] (60 y.o. M) Treating RN: Dolan Amen Primary Care Provider: Dorisann Frames Other Clinician: Referring Provider: Dorisann Frames Treating Provider/Extender: [name] in Treatment: 17 Subjective Chief Complaint Information obtained from Patient Left LE Traumatic Ulcer History of Present Illness (HPI) 07/04/2020 patient presents today for initial evaluation here in our clinic. He is actually a ward of the state. With that being said he is only been in the group home that he is currently staying in for the past few weeks in fact it was only about 2 weeks. He was seen in the ED for evaluation on 06/18/2020 due to an injury that he sustained to his left lower leg. He tells me that he got up in the middle the night to go to the bathroom or something and tripped over a wheelchair causing a significant skin tear/laceration to the leg. Unfortunately this was sutured but has dehisced  in some areas and appears to  have a significant hematoma and others. The proximal portion of the wound has actually necrosis and the skin flap is can have to be removed. The inferior portion appears to still be healthy and has reattached to some degree along the suture line this is still somewhat dehiscing and is can require some sharp debridement. Fortunately there does not appear to be any signs of active infection at this time. With that being said I do believe that we may want to put him back on some preventative antibiotics, Keflex, just to ensure following the debridement today that he is going to be infection free. Released to the best of our ability. The patient does have a history of end-stage renal disease that he is on dialysis. He also has schizophrenia at this point is now a ward of the state as it was deemed that he was not competent to be able to completely care for himself. Apparently his living condition prior to going to the group home was very poor. With that being said he seems to be doing much better now in my opinion and I do feel like that the location is that is a very good facility. The patient is going require some sharp debridement today and we did talk to [name] who is at the [facility] to gain approval for the patient. This was for debridement in particular. We did gain that approval and it was myself in [place] who witnessed this. Upon evaluation today patient appears to be doing a little better in regard to his wound currently. Fortunately there is no signs of active infection at this time. No fevers, chills, nausea, vomiting, or diarrhea. He has been taking the Keflex without complication. With that being said I am concerned today that he did develop a blood clot under the skin flap that skin to prevent him healing. I think we need to do something to try to prevent this from continuing to be an issue. For that reason I discussed with him the possibility of doing a Unna boot  wrap which I think it was going to be the best thing for him as far as healing is concerned he is in agreement with the plan. 07/18/2020. Upon evaluation today patient's wound bed actually showed signs of improvement compared to previous evaluation. I feel like he is granulating in and does seem to be healing quite nicely. Fortunately there is no evidence of active infection which is great news and overall I think that he is headed in the right direction. 07/25/2020 upon evaluation today patient's wound actually appears to show signs of good improvement. I am actually very pleased with where we stand today. He is actually showing some signs of hypergranulation and I think he could benefit from switching to [facility] That was discussed with the patient to let them know what was going on today as well. 08/01/2020 upon evaluation today patient appears to be doing excellent in regard to his wound on his leg. Fortunately there is no signs of active infection at this time. No fevers, chills, nausea, vomiting, or diarrhea. 08/15/2020 on evaluation today patient appears to be doing well with regard to his leg ulcer. This is showing signs of excellent improvement overall I am extremely pleased with where we stand he is definitely significantly smaller. 08/29/2020 upon evaluation today patient appears to be doing excellent in regard to his wound. Is been tolerating the dressing changes without complication. Fortunately  I think that the patient is making excellent progress and in general I am extremely pleased with where things stand today. 09/12/2020 upon evaluation today patient appears to be doing well with regard to his wound. In fact this is showing signs of doing excellent as measuring significantly smaller and seems to overall be making great progress. I am extremely pleased with where we stand today. Overall I do not see any evidence of infection which is great news as well. 4/28; 2-week follow-up  visit. The patient has home health changing his dressing. This was initially traumatic wound on the left anterior lower leg which was large plus or minus a hematoma. In any case it is now down to a very small healthy looking area in the superior part of the original wound we have been using Hydrofera Blue under compression 10/10/2020 upon evaluation today patient's wound is almost completely healed. He does have a small blister just lateral to where the actual wound opening is here today but again this is nothing too significant which is great news. Overall I am extremely pleased with where he stands at this point. No fevers, chills, nausea, vomiting, or diarrhea. 10/31/2020 upon evaluation today patient appears to be doing excellent in fact he is completely healed based on what I see. It is going to be however of utmost importance to keep the edema under control so this does not attempt to reopen. This was discussed with the patient and his caregiver today as well. Todd Brown, Todd Brown. ([id number]) Objective Constitutional Well-nourished and well-hydrated in no acute distress. Vitals Time Taken: 9:59 AM, Height: 75 in, Weight: 190 lbs, BMI: 23.7, Temperature: 98.5 F, Pulse: 78 bpm, Respiratory Rate: 18 breaths/min, Blood Pressure: 90/47 mmHg. Respiratory normal breathing without difficulty. Psychiatric this patient is able to make decisions and demonstrates good insight into disease process. Alert and Oriented x 3. pleasant and cooperative. General Notes: Upon inspection patient's wound again shows complete epithelization I am very pleased in this regard I know the patient is extremely happy as well. There is no evidence whatsoever of infection and I think as long as we keep the edema under control he will be in good shape. Integumentary (Hair, Skin) Wound #1 status is Healed - Epithelialized. Original cause of wound was Laceration. The date acquired was: 06/18/2020. The wound has been  in treatment 17 weeks. The wound is located on the Left,Anterior Lower Leg. The wound measures 0cm length x 0cm width x 0cm depth; 0cm^2 area and 0cm^3 volume. There is no tunneling or undermining noted. There is a none present amount of drainage noted. The wound margin is flat and intact. There is no granulation within the wound bed. There is no necrotic tissue within the wound bed. Wound #2 status is Open. Original cause of wound was Gradually Appeared. The date acquired was: 10/10/2020. The wound has been in treatment 3 weeks. The wound is located on the Left,Medial Lower Leg. The wound measures 0cm length x 0cm width x 0cm depth; 0cm^2 area and 0cm^3 volume. There is no tunneling or undermining noted. There is a none present amount of drainage noted. There is no granulation within the wound bed. There is no necrotic tissue within the wound bed. Assessment Active Problems ICD-10 Unspecified open wound, left lower leg, initial encounter Non-pressure chronic ulcer of other part of left lower leg with fat layer exposed End stage renal disease Dependence on renal dialysis Other schizophrenia Plan Discharge From [facility] Services: Discharge from [place] Treatment Complete -  Double layer tubi grip E-extra sent with patient Wear compression garments daily. Put garments on first thing when you wake up and remove them before bed. 1. I did discuss with the caregiver at the group home where he is currently that he does need to have 15 to 20 mmHg compression stockings. She is can work on getting those ordered we gave her the information measurements for elastic therapy. 2. For the meantime we did go ahead and use double layer Tubigrip in order to help control the edema I think that is good at this point based on what I am seeing. We will see the patient back for follow-up visit as needed. Electronic Signature(s) Signed: 11/01/2020 7:48:18 AM By: Worthy Keeler PA-C Entered By: Worthy Keeler on  11/01/2020 07:48:18 Todd Brown, Todd Brown. ([id number]) Todd Brown, Todd Brown. ([id number]) -------------------------------------------------------------------------------- SuperBill Details Patient Name: Todd Brown, Todd Brown. Date of Service: 10/31/2020 Medical Record Number: [id number] Patient Account Number: [id number] Date of Birth/Sex: 05-23-1961 (60 y.o. M) Treating RN: Dolan Amen Primary Care Provider: Dorisann Frames Other Clinician: Referring Provider: Dorisann Frames Treating Provider/Extender: [name] in Treatment: 17 Diagnosis Coding ICD-10 Codes Code Description [phone] Unspecified open wound, left lower leg, initial encounter 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 layer exposed N18.6 End stage renal disease Z99.2 Dependence on renal dialysis F20.89 Other schizophrenia Facility Procedures CPT4 Code: [id number] Description: [id number] - WOUND CARE VISIT-LEV 2 EST PT Modifier: Quantity: 1 Physician Procedures CPT4 Code: [id number] Description: [id number] - WC PHYS LEVEL 3 - EST PT Modifier: Quantity: 1 CPT4 Code: Description: ICD-10 Diagnosis Description S81.802A Unspecified open wound, left lower leg, initial encounter 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 lay N18.6 End stage renal disease Z99.2 Dependence on renal dialysis Modifier: er exposed Quantity: Electronic Signature(s) Signed: 10/31/2020 4:59:45 PM By: Worthy Keeler PA-C Entered By: Worthy Keeler on 10/31/2020 16:59:45

## 2020-11-27 ENCOUNTER — Other Ambulatory Visit (INDEPENDENT_AMBULATORY_CARE_PROVIDER_SITE_OTHER): Payer: Self-pay | Admitting: Vascular Surgery

## 2020-11-27 DIAGNOSIS — Z992 Dependence on renal dialysis: Secondary | ICD-10-CM

## 2020-11-28 ENCOUNTER — Ambulatory Visit (INDEPENDENT_AMBULATORY_CARE_PROVIDER_SITE_OTHER): Payer: Medicaid Other | Admitting: Nurse Practitioner

## 2020-11-28 ENCOUNTER — Ambulatory Visit (INDEPENDENT_AMBULATORY_CARE_PROVIDER_SITE_OTHER): Payer: Medicaid Other

## 2020-11-28 ENCOUNTER — Other Ambulatory Visit: Payer: Self-pay

## 2020-11-28 ENCOUNTER — Encounter (INDEPENDENT_AMBULATORY_CARE_PROVIDER_SITE_OTHER): Payer: Self-pay | Admitting: Nurse Practitioner

## 2020-11-28 VITALS — BP 143/75 | HR 64 | Resp 16

## 2020-11-28 DIAGNOSIS — Z992 Dependence on renal dialysis: Secondary | ICD-10-CM

## 2020-11-28 DIAGNOSIS — I10 Essential (primary) hypertension: Secondary | ICD-10-CM | POA: Diagnosis not present

## 2020-11-28 DIAGNOSIS — N186 End stage renal disease: Secondary | ICD-10-CM | POA: Diagnosis not present

## 2020-12-13 ENCOUNTER — Encounter (INDEPENDENT_AMBULATORY_CARE_PROVIDER_SITE_OTHER): Payer: Self-pay | Admitting: Nurse Practitioner

## 2020-12-13 NOTE — Progress Notes (Signed)
Subjective:    Patient ID: Todd Brown, male    DOB: 27-Oct-1960, 60 y.o.   MRN: [id number] Chief Complaint  Patient presents with   New Patient (Initial Visit)    Ref [place] consult skin thinning over access    Erza Yoffe is a 60 year old man that presents today by Dr. Zollie Scale in regards to an aneurysmal AV fistula with associated thinning over his access.  The patient has not attended any with him as he has a legal guardian.  The patient has a left brachiocephalic AV fistula no obvious areas of stenosis blood there is large aneurysmal degeneration.  The patient does have evidence of skin threatening on his aneurysm.  He denies any extensive bleeding or ulceration at this time.  They did not also survive much of this information.  He denies any fevers or chills.  There is a flow volume of 865.   Review of Systems  Neurological:  Positive for weakness.  All other systems reviewed and are negative.     Objective:   Physical Exam Vitals reviewed.  HENT:     Head: Normocephalic.  Cardiovascular:     Rate and Rhythm: Normal rate and regular rhythm.     Pulses:          Radial pulses are 2+ on the left side.  Pulmonary:     Effort: Pulmonary effort is normal.  Neurological:     Mental Status: He is alert and oriented to person, place, and time.     Motor: Weakness present.     Gait: Gait abnormal.  Psychiatric:        Mood and Affect: Mood normal.        Behavior: Behavior normal. Behavior is cooperative.        Thought Content: Thought content normal.        Cognition and Memory: Cognition is impaired. Memory is impaired.        Judgment: Judgment normal.    BP (!) 143/75 (BP Location: Right Arm)   Pulse 64   Resp 16   Past Medical History:  Diagnosis Date   Anemia    Diabetes mellitus without complication ([place])    Dialysis patient ([place])    Mon. 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   ESRD (end stage renal  disease) ([place])    Hyperlipidemia    Malignant hypertension    Renal artery stenosis ([facility])    Schizophrenia ([facility])     Social History   Socioeconomic History   Marital status: Single    Spouse name: Not on file   Number of children: Not on file   Years of education: Not on file   Highest education level: Not on file  Occupational History   Occupation: Disability   Tobacco Use   Smoking status: Every Day    Packs/day: 0.50    Types: Cigarettes   Smokeless tobacco: Never   Tobacco comments:    10 cigars a day  Vaping Use   Vaping Use: Never used  Substance and Sexual Activity   Alcohol use: No   Drug use: Yes    Frequency: 1.0 times per week    Types: Marijuana   Sexual activity: Not Currently  Other Topics Concern   Not on file  Social History Narrative   Not on file   Social Determinants of Health   Financial Resource Strain: Not on file  Food Insecurity: Not  on file  Transportation Needs: Not on file  Physical Activity: Not on file  Stress: Not on file  Social Connections: Not on file  Intimate Partner Violence: Not on file    Past Surgical History:  Procedure Laterality Date   A/V FISTULAGRAM N/A 08/06/2016   Procedure: A/V Fistulagram;  Surgeon: Algernon Huxley, MD;  Location: [facility];  Service: Cardiovascular;  Laterality: N/A;   A/V SHUNT INTERVENTION N/A 08/06/2016   Procedure: A/V Shunt Intervention;  Surgeon: Algernon Huxley, MD;  Location: [place];  Service: Cardiovascular;  Laterality: N/A;   AV FISTULA PLACEMENT Left 12/26/2014   Procedure: ARTERIOVENOUS (AV) FISTULA CREATION;  Surgeon: Algernon Huxley, MD;  Location: [facility];  Service: Vascular;  Laterality: Left;   ESOPHAGOGASTRODUODENOSCOPY (EGD) WITH PROPOFOL N/A 05/06/2017   Procedure: ESOPHAGOGASTRODUODENOSCOPY (EGD) WITH PROPOFOL;  Surgeon: Jonathon Bellows, MD;  Location: [facility];  Service: Gastroenterology;  Laterality: N/A;   INSERTION OF DIALYSIS CATHETER Right    PERIPHERAL  VASCULAR CATHETERIZATION N/A 10/22/2014   Procedure: Dialysis/Perma Catheter Insertion;  Surgeon: Algernon Huxley, MD;  Location: [place] [facility];  Service: Cardiovascular;  Laterality: N/A;   PERIPHERAL VASCULAR CATHETERIZATION N/A 02/28/2015   Procedure: Dialysis/Perma Catheter Removal;  Surgeon: Algernon Huxley, MD;  Location: [place] [facility];  Service: Cardiovascular;  Laterality: N/A;   Repair fx left lower leg     yrs ago (age 46)    Family History  Problem Relation Age of Onset   Kidney disease Brother    Diabetes Neg Hx     Allergies  Allergen Reactions   Chlorpromazine Other (See Comments)    Reaction:  Unknown , pt states it makes him feel real bad Reaction:  Unknown , pt states it makes him feel real bad     CBC Latest Ref Rng & Units 10/23/2019 10/20/2019 10/16/2019  WBC 4.0 - 10.5 K/uL 5.7 6.1 5.6  Hemoglobin 13.0 - 17.0 g/dL 9.1(L) 9.5(L) 10.9(L)  Hematocrit 39.0 - 52.0 % 26.3(L) 27.3(L) 31.2(L)  Platelets 150 - 400 K/uL 187 180 216      CMP     Component Value Date/Time   NA 132 (L) 10/23/2019 0405   K 4.8 10/24/2019 0844   CL 93 (L) 10/23/2019 0405   CO2 23 10/23/2019 0405   GLUCOSE 105 (H) 10/23/2019 0405   BUN 101 (H) 10/23/2019 0405   CREATININE 8.95 (H) 10/23/2019 0405   CREATININE 3.47 (H) 03/23/2013 1501   CALCIUM 9.4 10/23/2019 0405   CALCIUM 8.1 (L) 10/22/2014 0504   PROT 7.4 10/04/2019 1545   ALBUMIN 3.8 10/20/2019 0845   AST 30 10/04/2019 1545   ALT 24 10/04/2019 1545   ALKPHOS 81 10/04/2019 1545   BILITOT 0.6 10/04/2019 1545   GFRNONAA 6 (L) 10/23/2019 0405   GFRAA 7 (L) 10/23/2019 0405     No results found.     Assessment & Plan:   1. ESRD (end stage renal disease) on dialysis [facility]) Had discussion with the patient and his attendant about aneurysmal fistula is 1 day postop visit.  Discussed the procedure with the patient as well as the risk benefits and alternatives.  We discussed placement of a jump graft with removal ligation of  his fistula.  The patient would also need PermCath placement during this time.  The patient is currently a ward of DSS.  We have contacted him for consent.  We will move once consent is obtained.  2. Primary hypertension  Continue antihypertensive medications as already ordered, these medications have been reviewed and there are no changes at this time.    Current Outpatient Medications on File Prior to Visit  Medication Sig Dispense Refill   acetaminophen (TYLENOL) 500 MG tablet Take 500 mg by mouth every 4 (four) hours as needed for mild pain or moderate pain.      albuterol (VENTOLIN HFA) 108 (90 Base) MCG/ACT inhaler Inhale 1-2 puffs into the lungs every 4 (four) hours as needed for wheezing or shortness of breath.      amLODipine (NORVASC) 10 MG tablet Take 1 tablet (10 mg total) by mouth daily.     calcium acetate (PHOSLO) 667 MG capsule Take 2 capsules (1,334 mg total) by mouth 3 (three) times daily with meals. 60 capsule 1   carbamazepine (TEGRETOL XR) 200 MG 12 hr tablet Take 1,000 mg by mouth every morning.     cholecalciferol (VITAMIN D) 1000 UNITS tablet Take 1,000 Units by mouth daily.     cloNIDine (CATAPRES) 0.3 MG tablet Take 0.3 mg by mouth 3 (three) times daily.     ferrous sulfate 325 (65 FE) MG tablet Take 1 tablet (325 mg total) by mouth daily. 30 tablet 3   fluPHENAZine (PROLIXIN) 2.5 MG tablet Take 2.5 mg by mouth daily.     Fluticasone-Salmeterol (ADVAIR) 250-50 MCG/DOSE AEPB Inhale 1 puff into the lungs 2 (two) times daily.      hydrALAZINE (APRESOLINE) 100 MG tablet Take 1 tablet (100 mg total) by mouth 2 (two) times daily.     multivitamin (RENA-VIT) TABS tablet Take 1 tablet by mouth daily.     omeprazole (PRILOSEC) 40 MG capsule Take 1 capsule (40 mg total) by mouth daily. 30 capsule 1   thiothixene (NAVANE) 2 MG capsule Take 1 capsule (2 mg total) by mouth at bedtime. 30 capsule 0   benztropine (COGENTIN) 0.5 MG tablet Take 1 tablet (0.5 mg total) by mouth 2 (two)  times daily. (Patient not taking: Reported on 11/28/2020) 30 tablet 0   celecoxib (CELEBREX) 200 MG capsule Take 200 mg by mouth daily as needed for mild pain or moderate pain.  (Patient not taking: Reported on 11/28/2020)     cephALEXin (KEFLEX) 500 MG capsule Take 1 capsule (500 mg total) by mouth 3 (three) times daily. (Patient not taking: Reported on 11/28/2020) 21 capsule 0   minoxidil (LONITEN) 10 MG tablet Take 10 mg by mouth 2 (two) times daily. (Patient not taking: Reported on 11/28/2020)     sertraline (ZOLOFT) 25 MG tablet Take 1 tablet (25 mg total) by mouth daily. (Patient not taking: Reported on 11/28/2020) 30 tablet 0   No current facility-administered medications on file prior to visit.    There are no Patient Instructions on file for this visit. No follow-ups on file.   Kris Hartmann, NP

## 2020-12-25 ENCOUNTER — Telehealth (INDEPENDENT_AMBULATORY_CARE_PROVIDER_SITE_OTHER): Payer: Self-pay

## 2020-12-25 NOTE — Telephone Encounter (Signed)
I attempted to contact the patient to schedule him for a left jump graft revision w/ aneurysm resection w/ Artegraft. I spoke with Todd Brown at [facility]  and she stated she would call back to schedule the patient.

## 2021-01-01 ENCOUNTER — Telehealth (INDEPENDENT_AMBULATORY_CARE_PROVIDER_SITE_OTHER): Payer: Self-pay

## 2021-01-01 ENCOUNTER — Encounter (INDEPENDENT_AMBULATORY_CARE_PROVIDER_SITE_OTHER): Payer: Self-pay

## 2021-01-01 NOTE — Telephone Encounter (Signed)
This pt has been scheduled for 01/09/21 with DR. Dew. I will be calling his guardian making them aware.

## 2021-01-01 NOTE — Telephone Encounter (Signed)
I called the pt's caregiver queen and left a VM with the  pt's surgery information.  Pre Op: phone call on 8/8 between 8 AM and 1 PM   Covid Test: Tuesday 8/9 between 8-12 [facility].  Sur[facility] 11:30 as well as NPO 4 hours prior except morning medicine with a sip of water, if you take insulin take only 1/2 the dosage that AM,bring med list, do not take metformin 24 hours prior or 48 hours after,stop blood thinners xarelto, coumadin or eliquis 3 days prior

## 2021-01-02 ENCOUNTER — Encounter (INDEPENDENT_AMBULATORY_CARE_PROVIDER_SITE_OTHER): Payer: Self-pay

## 2021-01-02 ENCOUNTER — Telehealth (INDEPENDENT_AMBULATORY_CARE_PROVIDER_SITE_OTHER): Payer: Self-pay

## 2021-01-02 NOTE — Telephone Encounter (Signed)
Spoke to [facility] and made her aware that per the NP we are waiting on signed permission to perform the procedure on the pt from his legal guardian and until we receive this we can not move forward in the process of his procedure being scheduled. Elvis Coil was very upset an said that this has been going on for 3 week and she thought when she was called inially it was ok to go an that she was going to call DSS and see if she could speak with some one.

## 2021-01-06 ENCOUNTER — Inpatient Hospital Stay
Admission: RE | Admit: 2021-01-06 | Discharge: 2021-01-06 | Disposition: A | Discharge status: Home / Self Care | Payer: Medicaid Other | Source: Ambulatory Visit

## 2021-01-06 ENCOUNTER — Other Ambulatory Visit (INDEPENDENT_AMBULATORY_CARE_PROVIDER_SITE_OTHER): Payer: Self-pay | Admitting: Nurse Practitioner

## 2021-01-06 NOTE — Pre-Procedure Instructions (Signed)
TC to Todd Brown, at [facility] facility to give instructions for procedure with Dr. Lucky Cowboy for 01/09/21. Mrs. Love stated that she is not able to bring him for COVID test, labs or EKG and that the date of the surgery will need to be re-scheduled. She was not notified from the doctor's office about the surgery date. She will contact the office and see if she can get it re-scheduled.

## 2021-01-07 ENCOUNTER — Other Ambulatory Visit: Admission: RE | Admit: 2021-01-07 | Payer: Medicaid Other | Source: Ambulatory Visit

## 2021-01-09 ENCOUNTER — Inpatient Hospital Stay: Admission: RE | Admit: 2021-01-09 | Payer: Medicaid Other | Source: Home / Self Care | Admitting: Vascular Surgery

## 2021-01-09 ENCOUNTER — Encounter: Admission: RE | Payer: Self-pay | Source: Home / Self Care

## 2021-01-09 SURGERY — RESECTION OF ARTERIOVENOUS FISTULA ANEURYSM
Anesthesia: General

## 2021-01-09 NOTE — Telephone Encounter (Signed)
Richarda Blade called back and would like to reschedule patient procedure. Please advise.

## 2021-01-20 ENCOUNTER — Encounter: Payer: Self-pay | Admitting: Emergency Medicine

## 2021-01-20 ENCOUNTER — Emergency Department
Admission: EM | Admit: 2021-01-20 | Discharge: 2021-01-20 | Disposition: A | Discharge status: Home / Self Care | Payer: Medicaid Other | Attending: Emergency Medicine | Admitting: Emergency Medicine

## 2021-01-20 ENCOUNTER — Other Ambulatory Visit: Payer: Self-pay

## 2021-01-20 DIAGNOSIS — Z7901 Long term (current) use of anticoagulants: Secondary | ICD-10-CM | POA: Diagnosis not present

## 2021-01-20 DIAGNOSIS — Z79899 Other long term (current) drug therapy: Secondary | ICD-10-CM | POA: Insufficient documentation

## 2021-01-20 DIAGNOSIS — Y841 Kidney dialysis as the cause of abnormal reaction of the patient, or of later complication, without mention of misadventure at the time of the procedure: Secondary | ICD-10-CM | POA: Insufficient documentation

## 2021-01-20 DIAGNOSIS — F17210 Nicotine dependence, cigarettes, uncomplicated: Secondary | ICD-10-CM | POA: Insufficient documentation

## 2021-01-20 DIAGNOSIS — T82590A Other mechanical complication of surgically created arteriovenous fistula, initial encounter: Secondary | ICD-10-CM | POA: Insufficient documentation

## 2021-01-20 DIAGNOSIS — Z992 Dependence on renal dialysis: Secondary | ICD-10-CM | POA: Diagnosis not present

## 2021-01-20 DIAGNOSIS — I120 Hypertensive chronic kidney disease with stage 5 chronic kidney disease or end stage renal disease: Secondary | ICD-10-CM | POA: Insufficient documentation

## 2021-01-20 DIAGNOSIS — T829XXA Unspecified complication of cardiac and vascular prosthetic device, implant and graft, initial encounter: Secondary | ICD-10-CM

## 2021-01-20 DIAGNOSIS — N186 End stage renal disease: Secondary | ICD-10-CM | POA: Diagnosis not present

## 2021-01-20 DIAGNOSIS — E1122 Type 2 diabetes mellitus with diabetic chronic kidney disease: Secondary | ICD-10-CM | POA: Diagnosis not present

## 2021-01-20 LAB — BASIC METABOLIC PANEL
Anion gap: 12 (ref 5–15)
BUN: 20 mg/dL (ref 6–20)
CO2: 30 mmol/L (ref 22–32)
Calcium: 9.3 mg/dL (ref 8.9–10.3)
Chloride: 95 mmol/L — ABNORMAL LOW (ref 98–111)
Creatinine, Ser: 4.22 mg/dL — ABNORMAL HIGH (ref 0.61–1.24)
GFR, Estimated: 15 mL/min — ABNORMAL LOW (ref >60)
Glucose, Bld: 91 mg/dL (ref 70–99)
Potassium: 3.5 mmol/L (ref 3.5–5.1)
Sodium: 137 mmol/L (ref 135–145)

## 2021-01-20 LAB — CBC WITH DIFFERENTIAL/PLATELET
Abs Immature Granulocytes: 0.02 10*3/uL (ref 0.00–0.07)
Basophils Absolute: 0 10*3/uL (ref 0.0–0.1)
Basophils Relative: 0 %
Eosinophils Absolute: 0.1 10*3/uL (ref 0.0–0.5)
Eosinophils Relative: 2 %
HCT: 30.4 % — ABNORMAL LOW (ref 39.0–52.0)
Hemoglobin: 10.4 g/dL — ABNORMAL LOW (ref 13.0–17.0)
Immature Granulocytes: 0 %
Lymphocytes Relative: 17 %
Lymphs Abs: 1 10*3/uL (ref 0.7–4.0)
MCH: 33.5 pg (ref 26.0–34.0)
MCHC: 34.2 g/dL (ref 30.0–36.0)
MCV: 98.1 fL (ref 80.0–100.0)
Monocytes Absolute: 0.6 10*3/uL (ref 0.1–1.0)
Monocytes Relative: 10 %
Neutro Abs: 3.9 10*3/uL (ref 1.7–7.7)
Neutrophils Relative %: 71 %
Platelets: 152 10*3/uL (ref 150–400)
RBC: 3.1 MIL/uL — ABNORMAL LOW (ref 4.22–5.81)
RDW: 15.9 % — ABNORMAL HIGH (ref 11.5–15.5)
WBC: 5.6 10*3/uL (ref 4.0–10.5)
nRBC: 0 % (ref 0.0–0.2)

## 2021-01-20 LAB — APTT: aPTT: 27 seconds (ref 24–36)

## 2021-01-20 LAB — PROTIME-INR
INR: 0.9 (ref 0.8–1.2)
Prothrombin Time: 12.3 seconds (ref 11.4–15.2)

## 2021-01-20 NOTE — Telephone Encounter (Signed)
Patient will be reschedule

## 2021-01-20 NOTE — ED Notes (Signed)
Pt provided a dinner tray.

## 2021-01-20 NOTE — ED Notes (Signed)
Pt comes via [facility] with c/o bleeding from fistula. Pt had dialysis treatment today. [facility] reports pt began having bleeding after arriving home. Pt had large clots. bandage in place and bleeding controlled.   BP-201/97

## 2021-01-20 NOTE — Discharge Instructions (Addendum)
Labs are reassuring.  Bleeding stopped.  Leave the dressing on until tomorrow.  Return to the ER for continued bleeding or any other concern

## 2021-01-20 NOTE — ED Notes (Signed)
Pts fistula redressed by Dr. Jari Pigg.

## 2021-01-20 NOTE — ED Provider Notes (Signed)
[facility] Emergency Department Provider Note  ____________________________________________   Event Date/Time   First MD Initiated Contact with Patient 01/20/21 1929     (approximate)  I have reviewed the triage vital signs and the nursing notes.   HISTORY  Chief Complaint Vascular Access Problem    HPI Todd Brown is a 60 y.o. male with ESRD on dialysis who comes in for fistula bleed.  Patient began having bleeding after arriving home.  Patient had large clots.  Patient denies any blood thinners.  Patient reports having his full dialysis session.  Bleeding started a few hours after dialysis.  Constant, better with pressure.  Reports that this happened previously usually is better with pressure            Past Medical History:  Diagnosis Date   Anemia    Diabetes mellitus without complication [facility])    Dialysis patient ([place])    Mon. -Wed.- Fri   DVT (deep venous thrombosis) ([place])    cephalic and basolic vein thrombosis   ESRD (end stage renal disease) ([place])    Hyperlipidemia    Malignant hypertension    Renal artery stenosis ([place])    Schizophrenia ([place])     Patient Active Problem List   Diagnosis Date Noted   Hypertension 11/28/2020   Acute respiratory failure with hypoxia ([place]) 10/23/2019   Anemia of chronic disease 10/23/2019   Fluid overload 10/08/2018   Acute pulmonary edema ([place]) 09/12/2018   Scrotal edema 05/10/2017   Symptomatic anemia 04/30/2017   Anticoagulated on Coumadin 09/14/2016   Cardiac arrest ([place]) 08/03/2016   Sepsis ([place]) 05/26/2016   Dialysis patient ([place]) 04/15/2016   End stage renal disease ([place]) 02/12/2016   Hyperkalemia 05/20/2015   Hepatitis C 09/26/2013   Schizophrenia ([place]) 03/23/2013   Essential hypertension, benign 03/23/2013   Chronic kidney disease 03/23/2013    Past Surgical History:  Procedure Laterality Date   A/V FISTULAGRAM N/A 08/06/2016   Procedure: A/V Fistulagram;  Surgeon: Algernon Huxley, MD;  Location: [place] [facility];  Service: Cardiovascular;  Laterality: N/A;   A/V SHUNT INTERVENTION N/A 08/06/2016   Procedure: A/V Shunt Intervention;  Surgeon: Algernon Huxley, MD;  Location: [place] [facility];  Service: Cardiovascular;  Laterality: N/A;   AV FISTULA PLACEMENT Left 12/26/2014   Procedure: ARTERIOVENOUS (AV) FISTULA CREATION;  Surgeon: Algernon Huxley, MD;  Location: [facility];  Service: Vascular;  Laterality: Left;   ESOPHAGOGASTRODUODENOSCOPY (EGD) WITH PROPOFOL N/A 05/06/2017   Procedure: ESOPHAGOGASTRODUODENOSCOPY (EGD) WITH PROPOFOL;  Surgeon: Jonathon Bellows, MD;  Location: [facility]OSCOPY;  Service: Gastroenterology;  Laterality: N/A;   INSERTION OF DIALYSIS CATHETER Right    PERIPHERAL VASCULAR CATHETERIZATION N/A 10/22/2014   Procedure: Dialysis/Perma Catheter Insertion;  Surgeon: Algernon Huxley, MD;  Location: [place] [facility];  Service: Cardiovascular;  Laterality: N/A;   PERIPHERAL VASCULAR CATHETERIZATION N/A 02/28/2015   Procedure: Dialysis/Perma Catheter Removal;  Surgeon: Algernon Huxley, MD;  Location: [place] [facility];  Service: Cardiovascular;  Laterality: N/A;   Repair fx left lower leg     yrs ago (age 64)    Prior to Admission medications   Medication Sig Start Date End Date Taking? Authorizing Provider  acetaminophen (TYLENOL) 500 MG tablet Take 500 mg by mouth every 4 (four) hours as needed for mild pain or moderate pain.     [provider]  albuterol (VENTOLIN HFA) 108 (90 Base) MCG/ACT inhaler Inhale 1-2 puffs into the lungs every  4 (four) hours as needed for wheezing or shortness of breath.     [provider]  amLODipine (NORVASC) 10 MG tablet Take 1 tablet (10 mg total) by mouth daily. 09/13/18   Bettey Costa, MD  benztropine (COGENTIN) 0.5 MG tablet Take 1 tablet (0.5 mg total) by mouth 2 (two) times daily. Patient not taking: Reported on 11/28/2020 10/24/19   Fritzi Mandes, MD  calcium acetate (PHOSLO) 667 MG capsule Take 2 capsules  (1,334 mg total) by mouth 3 (three) times daily with meals. 05/19/15   Fritzi Mandes, MD  carbamazepine (TEGRETOL XR) 200 MG 12 hr tablet Take 1,000 mg by mouth every morning.    [provider]  celecoxib (CELEBREX) 200 MG capsule Take 200 mg by mouth daily as needed for mild pain or moderate pain.  Patient not taking: Reported on 11/28/2020    [provider]  cephALEXin (KEFLEX) 500 MG capsule Take 1 capsule (500 mg total) by mouth 3 (three) times daily. Patient not taking: Reported on 11/28/2020 06/18/20   Paulette Blanch, MD  cholecalciferol (VITAMIN D) 1000 UNITS tablet Take 1,000 Units by mouth daily.    [provider]  cloNIDine (CATAPRES) 0.3 MG tablet Take 0.3 mg by mouth 3 (three) times daily.    [provider]  ferrous sulfate 325 (65 FE) MG tablet Take 1 tablet (325 mg total) by mouth daily. 05/19/15   Fritzi Mandes, MD  fluPHENAZine (PROLIXIN) 2.5 MG tablet Take 2.5 mg by mouth daily.    [provider]  Fluticasone-Salmeterol (ADVAIR) 250-50 MCG/DOSE AEPB Inhale 1 puff into the lungs 2 (two) times daily.     [provider]  hydrALAZINE (APRESOLINE) 100 MG tablet Take 1 tablet (100 mg total) by mouth 2 (two) times daily. 09/13/18   Bettey Costa, MD  minoxidil (LONITEN) 10 MG tablet Take 10 mg by mouth 2 (two) times daily. Patient not taking: Reported on 11/28/2020    [provider]  multivitamin (RENA-VIT) TABS tablet Take 1 tablet by mouth daily.    [provider]  omeprazole (PRILOSEC) 40 MG capsule Take 1 capsule (40 mg total) by mouth daily. 05/06/17   Henreitta Leber, MD  sertraline (ZOLOFT) 25 MG tablet Take 1 tablet (25 mg total) by mouth daily. Patient not taking: Reported on 11/28/2020 10/24/19   Fritzi Mandes, MD  thiothixene (NAVANE) 2 MG capsule Take 1 capsule (2 mg total) by mouth at bedtime. 10/24/19   Fritzi Mandes, MD    Allergies Chlorpromazine  Family History  Problem Relation Age of Onset   Kidney  disease Brother    Diabetes Neg Hx     Social History Social History   Tobacco Use   Smoking status: Every Day    Packs/day: 0.50    Types: Cigarettes   Smokeless tobacco: Never   Tobacco comments:    10 cigars a day  Vaping Use   Vaping Use: Never used  Substance Use Topics   Alcohol use: No   Drug use: Yes    Frequency: 1.0 times per week    Types: Marijuana      Review of Systems Constitutional: No fever/chills Eyes: No visual changes. ENT: No sore throat. Cardiovascular: Denies chest pain. Respiratory: Denies shortness of breath. Gastrointestinal: No abdominal pain.  No nausea, no vomiting.  No diarrhea.  No constipation. Genitourinary: Negative for dysuria. Musculoskeletal: Negative for back pain.  Bleeding from fistula Skin: Negative for rash. Neurological: Negative for headaches, focal weakness or numbness.  All other ROS negative ____________________________________________   PHYSICAL EXAM:  VITAL SIGNS: ED Triage Vitals  Enc Vitals Group     BP 01/20/21 1747 (!) 197/95     Pulse Rate 01/20/21 1747 79     Resp 01/20/21 1747 20     Temp 01/20/21 1747 98.4 F (36.9 C)     Temp Source 01/20/21 1747 Oral     SpO2 01/20/21 1747 96 %     Weight 01/20/21 1749 155 lb (70.3 kg)     Height 01/20/21 1749 '6\' 3"'$  (1.905 m)     Head Circumference --      Peak Flow --      Pain Score 01/20/21 1748 0     Pain Loc --      Pain Edu? --      Excl. in [place]? --     Constitutional: Alert and oriented. Well appearing and in no acute distress. Eyes: Conjunctivae are normal. EOMI. Head: Atraumatic. Nose: No congestion/rhinnorhea. Mouth/Throat: Mucous membranes are moist.   Neck: No stridor. Trachea Midline. FROM Cardiovascular: Normal rate, regular rhythm. Grossly normal heart sounds.  Good peripheral circulation. Respiratory: Normal respiratory effort.  No retractions. Lungs CTAB. Gastrointestinal: Soft and nontender. No distention. No abdominal bruits.   Musculoskeletal: Left arm fistula with good thrill.  Dressing was removed and he had 2 pin pricks with dried blood on them but no active bleeding. Neurologic: Speech somewhat difficult to understand but patient states that it is his baseline no gross focal neurologic deficits are appreciated.  Skin:  Skin is warm, dry and intact. No rash noted. Psychiatric: Mood and affect are normal. Speech and behavior are normal. GU: Deferred   ____________________________________________   LABS (all labs ordered are listed, but only abnormal results are displayed)  Labs Reviewed  CBC WITH DIFFERENTIAL/PLATELET  BASIC METABOLIC PANEL  PROTIME-INR  APTT   ____________________________________________  PROCEDURES  Procedure(s) performed (including Critical Care):  Procedures   ____________________________________________   INITIAL IMPRESSION / ASSESSMENT AND PLAN / ED COURSE  Yerik Copp Goetting was evaluated in Emergency Department on 01/20/2021 for the symptoms described in the history of present illness. He was evaluated in the context of the global COVID-19 pandemic, which necessitated consideration that the patient might be at risk for infection with the SARS-CoV-2 virus that causes COVID-19. Institutional protocols and algorithms that pertain to the evaluation of patients at risk for COVID-19 are in a state of rapid change based on information released by regulatory bodies including the CDC and federal and state organizations. These policies and algorithms were followed during the patient's care in the ED.    Patient comes in for fistula bleed.  Due to the amount of bleeding we will get labs to make sure no evidence of anemia or significant coagulopathy.  I did take down the dressing at this time there is no active bleeding so I replaced 4 x 4's on the pinpricks and retake them down and instructed patient to leave this on until tomorrow.  Labs very reassuring.  No bleeding continued.  Will  discharge       ____________________________________________   FINAL CLINICAL IMPRESSION(S) / ED DIAGNOSES   Final diagnoses:  Complication of arteriovenous dialysis fistula, initial encounter      MEDICATIONS GIVEN DURING THIS VISIT:  Medications - No data to display   ED Discharge Orders     None        Note:  This document was prepared using Dragon voice recognition software and  may include unintentional dictation errors.    Vanessa Blaine, MD 01/20/21 2101

## 2021-01-20 NOTE — ED Notes (Signed)
D/C paperwork given to the transport team - report called to the facility.

## 2021-01-20 NOTE — ED Notes (Signed)
E-signature pad unavailable - Pt verbalized understanding of D/C information - no additional concerns at this time.  

## 2021-01-20 NOTE — ED Triage Notes (Signed)
Pt to ED via [facility] c/o fistula bleeding. Pt came from dialysis center, and after treatment fistula started bleeding. Pt states its been bleeding for the past hour. Denies pain. Denies blood thinner.   Pt A&Ox4, NAD

## 2021-01-29 ENCOUNTER — Encounter (INDEPENDENT_AMBULATORY_CARE_PROVIDER_SITE_OTHER): Payer: Self-pay

## 2021-02-05 ENCOUNTER — Encounter
Admission: RE | Admit: 2021-02-05 | Discharge: 2021-02-05 | Disposition: A | Discharge status: Home / Self Care | Payer: Medicaid Other | Source: Ambulatory Visit | Attending: Vascular Surgery | Admitting: Vascular Surgery

## 2021-02-05 ENCOUNTER — Other Ambulatory Visit (INDEPENDENT_AMBULATORY_CARE_PROVIDER_SITE_OTHER): Payer: Self-pay | Admitting: Nurse Practitioner

## 2021-02-05 ENCOUNTER — Other Ambulatory Visit: Payer: Self-pay

## 2021-02-05 NOTE — Patient Instructions (Addendum)
Your procedure is scheduled on: Friday, September 16 Report to the Registration Desk on the 1st floor of the [facility]. To find out your arrival time, please call [phone] between 1PM - 3PM on: Thursday, September 15  REMEMBER: Instructions that are not followed completely may result in serious medical risk, up to and including death; or upon the discretion of your surgeon and anesthesiologist your surgery may need to be rescheduled.  Do not eat food after midnight the night before surgery.  No gum chewing, lozengers or hard candies.  You may however, drink CLEAR liquids up to 2 hours before you are scheduled to arrive for your surgery. Do not drink anything within 2 hours of your scheduled arrival time.  Clear liquids include: - water  - apple juice without pulp - gatorade (not RED, PURPLE, OR BLUE) - black coffee or tea (Do NOT add milk or creamers to the coffee or tea) Do NOT drink anything that is not on this list.  TAKE THESE MEDICATIONS THE MORNING OF SURGERY WITH A SIP OF WATER:  Albuterol inhaler Amlodipine Carbamazepine (Tegretol) Clonidine Fluphenazine (prolixin) Advair inhaler Hydralazine Omeprazole (Prilosec) - (take one the night before and one on the morning of surgery - helps to prevent nausea after surgery.) Trihexyphenidyl (artane)  Use inhalers on the day of surgery and bring to the hospital.  One week prior to surgery: starting September 9 Stop Anti-inflammatories (NSAIDS) such as Advil, Aleve, Ibuprofen, Motrin, Naproxen, Naprosyn and Aspirin based products such as Excedrin, Goodys Powder, BC Powder. Stop ANY OVER THE COUNTER supplements until after surgery. You may however, continue to take Tylenol if needed for pain up until the day of surgery.  No Alcohol for 24 hours before or after surgery.  No Smoking including e-cigarettes for 24 hours prior to surgery.  No chewable tobacco products for at least 6 hours prior to surgery.  No nicotine  patches on the day of surgery.  Do not use any "recreational" drugs for at least a week prior to your surgery.  Please be advised that the combination of cocaine and anesthesia may have negative outcomes, up to and including death. If you test positive for cocaine, your surgery will be cancelled.  On the morning of surgery brush your teeth with toothpaste and water, you may rinse your mouth with mouthwash if you wish. Do not swallow any toothpaste or mouthwash.  Do not wear jewelry, make-up, hairpins, clips or nail polish.  Do not wear lotions, powders, or perfumes.   Do not shave body from the neck down 48 hours prior to surgery just in case you cut yourself which could leave a site for infection.  Also, freshly shaved skin may become irritated if using the CHG soap.  Contact lenses, hearing aids and dentures may not be worn into surgery.  Do not bring valuables to the hospital. [facility] is not responsible for any missing/lost belongings or valuables.   Use CHG Soap as directed on instruction sheet.  Notify your doctor if there is any change in your medical condition (cold, fever, infection).  Wear comfortable clothing (specific to your surgery type) to the hospital.  After surgery, you can help prevent lung complications by doing breathing exercises.  Take deep breaths and cough every 1-2 hours. Your doctor may order a device called an Incentive Spirometer to help you take deep breaths.  If you are being discharged the day of surgery, you will not be allowed to drive home. You will need a  responsible adult (18 years or older) to drive you home and stay with you that night.   If you are taking public transportation, you will need to have a responsible adult (18 years or older) with you. Please confirm with your physician that it is acceptable to use public transportation.   Please call the [place] [facility]. at [phone] if you have any questions about these  instructions.  Surgery Visitation Policy:  Patients undergoing a surgery or procedure may have one family member or support person with them as long as that person is not COVID-19 positive or experiencing its symptoms.  That person may remain in the waiting area during the procedure.

## 2021-02-06 ENCOUNTER — Encounter
Admission: RE | Admit: 2021-02-06 | Discharge: 2021-02-06 | Disposition: A | Discharge status: Home / Self Care | Payer: Medicaid Other | Source: Ambulatory Visit | Attending: Vascular Surgery | Admitting: Vascular Surgery

## 2021-02-06 DIAGNOSIS — Z01818 Encounter for other preprocedural examination: Secondary | ICD-10-CM | POA: Diagnosis not present

## 2021-02-06 LAB — BASIC METABOLIC PANEL
Anion gap: 10 (ref 5–15)
BUN: 39 mg/dL — ABNORMAL HIGH (ref 6–20)
CO2: 30 mmol/L (ref 22–32)
Calcium: 9.3 mg/dL (ref 8.9–10.3)
Chloride: 95 mmol/L — ABNORMAL LOW (ref 98–111)
Creatinine, Ser: 5.7 mg/dL — ABNORMAL HIGH (ref 0.61–1.24)
GFR, Estimated: 11 mL/min — ABNORMAL LOW (ref >60)
Glucose, Bld: 87 mg/dL (ref 70–99)
Potassium: 3.9 mmol/L (ref 3.5–5.1)
Sodium: 135 mmol/L (ref 135–145)

## 2021-02-06 LAB — CBC WITH DIFFERENTIAL/PLATELET
Abs Immature Granulocytes: 0.01 10*3/uL (ref 0.00–0.07)
Basophils Absolute: 0 10*3/uL (ref 0.0–0.1)
Basophils Relative: 1 %
Eosinophils Absolute: 0.1 10*3/uL (ref 0.0–0.5)
Eosinophils Relative: 3 %
HCT: 27.3 % — ABNORMAL LOW (ref 39.0–52.0)
Hemoglobin: 9.6 g/dL — ABNORMAL LOW (ref 13.0–17.0)
Immature Granulocytes: 0 %
Lymphocytes Relative: 24 %
Lymphs Abs: 1.1 10*3/uL (ref 0.7–4.0)
MCH: 34.2 pg — ABNORMAL HIGH (ref 26.0–34.0)
MCHC: 35.2 g/dL (ref 30.0–36.0)
MCV: 97.2 fL (ref 80.0–100.0)
Monocytes Absolute: 0.5 10*3/uL (ref 0.1–1.0)
Monocytes Relative: 10 %
Neutro Abs: 3 10*3/uL (ref 1.7–7.7)
Neutrophils Relative %: 62 %
Platelets: 210 10*3/uL (ref 150–400)
RBC: 2.81 MIL/uL — ABNORMAL LOW (ref 4.22–5.81)
RDW: 16.8 % — ABNORMAL HIGH (ref 11.5–15.5)
WBC: 4.8 10*3/uL (ref 4.0–10.5)
nRBC: 0 % (ref 0.0–0.2)

## 2021-02-06 LAB — PROTIME-INR
INR: 1 (ref 0.8–1.2)
Prothrombin Time: 13.1 seconds (ref 11.4–15.2)

## 2021-02-06 LAB — TYPE AND SCREEN
ABO/RH(D): O POS
Antibody Screen: NEGATIVE

## 2021-02-06 LAB — APTT: aPTT: 36 seconds (ref 24–36)

## 2021-02-14 ENCOUNTER — Other Ambulatory Visit (INDEPENDENT_AMBULATORY_CARE_PROVIDER_SITE_OTHER): Payer: Self-pay | Admitting: Vascular Surgery

## 2021-02-14 ENCOUNTER — Ambulatory Visit: Payer: Medicaid Other | Admitting: Anesthesiology

## 2021-02-14 ENCOUNTER — Ambulatory Visit: Admit: 2021-02-14 | Payer: Medicaid Other | Admitting: Vascular Surgery

## 2021-02-14 ENCOUNTER — Other Ambulatory Visit: Payer: Self-pay

## 2021-02-14 ENCOUNTER — Encounter
Admission: RE | Disposition: A | Discharge status: Nursing Home (Includes ICF) | Payer: Self-pay | Source: Ambulatory Visit | Attending: Vascular Surgery

## 2021-02-14 ENCOUNTER — Encounter: Payer: Self-pay | Admitting: Vascular Surgery

## 2021-02-14 ENCOUNTER — Encounter
Admission: RE | Disposition: A | Discharge status: Still In Hospital | Payer: Self-pay | Source: Home / Self Care | Attending: Vascular Surgery

## 2021-02-14 ENCOUNTER — Ambulatory Visit
Admission: RE | Admit: 2021-02-14 | Discharge: 2021-02-14 | Disposition: A | Discharge status: Nursing Home (Includes ICF) | Payer: Medicaid Other | Source: Ambulatory Visit | Attending: Vascular Surgery | Admitting: Vascular Surgery

## 2021-02-14 ENCOUNTER — Ambulatory Visit
Admission: RE | Admit: 2021-02-14 | Discharge: 2021-02-14 | Disposition: A | Discharge status: Still In Hospital | Payer: Medicaid Other | Attending: Vascular Surgery | Admitting: Vascular Surgery

## 2021-02-14 DIAGNOSIS — Z888 Allergy status to other drugs, medicaments and biological substances status: Secondary | ICD-10-CM | POA: Insufficient documentation

## 2021-02-14 DIAGNOSIS — I120 Hypertensive chronic kidney disease with stage 5 chronic kidney disease or end stage renal disease: Secondary | ICD-10-CM | POA: Insufficient documentation

## 2021-02-14 DIAGNOSIS — N186 End stage renal disease: Secondary | ICD-10-CM | POA: Insufficient documentation

## 2021-02-14 DIAGNOSIS — Y841 Kidney dialysis as the cause of abnormal reaction of the patient, or of later complication, without mention of misadventure at the time of the procedure: Secondary | ICD-10-CM | POA: Insufficient documentation

## 2021-02-14 DIAGNOSIS — F17210 Nicotine dependence, cigarettes, uncomplicated: Secondary | ICD-10-CM | POA: Insufficient documentation

## 2021-02-14 DIAGNOSIS — J449 Chronic obstructive pulmonary disease, unspecified: Secondary | ICD-10-CM | POA: Insufficient documentation

## 2021-02-14 DIAGNOSIS — T82510A Breakdown (mechanical) of surgically created arteriovenous fistula, initial encounter: Secondary | ICD-10-CM | POA: Insufficient documentation

## 2021-02-14 DIAGNOSIS — Y832 Surgical operation with anastomosis, bypass or graft as the cause of abnormal reaction of the patient, or of later complication, without mention of misadventure at the time of the procedure: Secondary | ICD-10-CM

## 2021-02-14 DIAGNOSIS — Z992 Dependence on renal dialysis: Secondary | ICD-10-CM | POA: Insufficient documentation

## 2021-02-14 HISTORY — PX: A/V FISTULAGRAM: CATH118298

## 2021-02-14 LAB — POCT I-STAT, CHEM 8
BUN: 18 mg/dL (ref 6–20)
Calcium, Ion: 1.17 mmol/L (ref 1.15–1.40)
Chloride: 96 mmol/L — ABNORMAL LOW (ref 98–111)
Creatinine, Ser: 4.2 mg/dL — ABNORMAL HIGH (ref 0.61–1.24)
Glucose, Bld: 87 mg/dL (ref 70–99)
HCT: 28 % — ABNORMAL LOW (ref 39.0–52.0)
Hemoglobin: 9.5 g/dL — ABNORMAL LOW (ref 13.0–17.0)
Potassium: 3.4 mmol/L — ABNORMAL LOW (ref 3.5–5.1)
Sodium: 137 mmol/L (ref 135–145)
TCO2: 32 mmol/L (ref 22–32)

## 2021-02-14 SURGERY — A/V FISTULAGRAM
Anesthesia: Moderate Sedation

## 2021-02-14 SURGERY — REVISION OF ARTERIOVENOUS GORETEX GRAFT
Anesthesia: General | Laterality: Left

## 2021-02-14 MED ORDER — HYDRALAZINE HCL 20 MG/ML IJ SOLN
INTRAMUSCULAR | Status: DC | PRN
  Administered 2021-02-14 (×2): 10 mg via INTRAVENOUS

## 2021-02-14 MED ORDER — HEPARIN SODIUM (PORCINE) 10000 UNIT/ML IJ SOLN
INTRAMUSCULAR | Status: AC
  Filled 2021-02-14: qty 1

## 2021-02-14 MED ORDER — SODIUM CHLORIDE 0.9 % IV SOLN: INTRAVENOUS | Status: DC

## 2021-02-14 MED ORDER — FENTANYL CITRATE (PF) 100 MCG/2ML IJ SOLN
INTRAMUSCULAR | Status: DC | PRN
  Administered 2021-02-14 (×2): 50 ug via INTRAVENOUS

## 2021-02-14 MED ORDER — MIDAZOLAM HCL 2 MG/2ML IJ SOLN
INTRAMUSCULAR | Status: DC | PRN
  Administered 2021-02-14 (×2): 1 mg via INTRAVENOUS

## 2021-02-14 MED ORDER — CEFAZOLIN SODIUM-DEXTROSE 1-4 GM/50ML-% IV SOLN
1.0000 g | INTRAVENOUS | Status: DC
Start: 2021-02-15 — End: 2021-02-14

## 2021-02-14 MED ORDER — CLONIDINE HCL 0.1 MG PO TABS
0.3000 mg | ORAL_TABLET | Freq: Three times a day (TID) | ORAL | Status: DC
  Administered 2021-02-14: 0.3 mg via ORAL
  Filled 2021-02-14: qty 3

## 2021-02-14 MED ORDER — HEPARIN SODIUM (PORCINE) 1000 UNIT/ML IJ SOLN
INTRAMUSCULAR | Status: AC
  Filled 2021-02-14: qty 1

## 2021-02-14 MED ORDER — ACETAMINOPHEN 325 MG PO TABS
ORAL_TABLET | ORAL | Status: AC
  Filled 2021-02-14: qty 2

## 2021-02-14 MED ORDER — ORAL CARE MOUTH RINSE: 15.0000 mL | Freq: Once | OROMUCOSAL | Status: DC

## 2021-02-14 MED ORDER — HYDROMORPHONE HCL 1 MG/ML IJ SOLN: 1.0000 mg | Freq: Once | INTRAMUSCULAR | Status: DC | PRN

## 2021-02-14 MED ORDER — ACETAMINOPHEN 325 MG PO TABS
650.0000 mg | ORAL_TABLET | Freq: Four times a day (QID) | ORAL | Status: DC | PRN
  Administered 2021-02-14: 650 mg via ORAL

## 2021-02-14 MED ORDER — CHLORHEXIDINE GLUCONATE 0.12 % MT SOLN: 15.0000 mL | Freq: Once | OROMUCOSAL | Status: DC

## 2021-02-14 MED ORDER — LACTATED RINGERS IV SOLN: INTRAVENOUS | Status: DC

## 2021-02-14 MED ORDER — MIDAZOLAM HCL 2 MG/2ML IJ SOLN
INTRAMUSCULAR | Status: AC
  Filled 2021-02-14: qty 2

## 2021-02-14 MED ORDER — FAMOTIDINE 20 MG PO TABS: 40.0000 mg | ORAL_TABLET | Freq: Once | ORAL | Status: DC | PRN

## 2021-02-14 MED ORDER — CEFAZOLIN SODIUM-DEXTROSE 1-4 GM/50ML-% IV SOLN
INTRAVENOUS | Status: AC
  Filled 2021-02-14: qty 50

## 2021-02-14 MED ORDER — FENTANYL CITRATE PF 50 MCG/ML IJ SOSY
PREFILLED_SYRINGE | INTRAMUSCULAR | Status: AC
  Filled 2021-02-14: qty 1

## 2021-02-14 MED ORDER — CHLORHEXIDINE GLUCONATE 0.12 % MT SOLN
OROMUCOSAL | Status: AC
  Administered 2021-02-14: 15 mL
  Filled 2021-02-14: qty 15

## 2021-02-14 MED ORDER — METHYLPREDNISOLONE SODIUM SUCC 125 MG IJ SOLR: 125.0000 mg | Freq: Once | INTRAMUSCULAR | Status: DC | PRN

## 2021-02-14 MED ORDER — DIPHENHYDRAMINE HCL 50 MG/ML IJ SOLN: 50.0000 mg | Freq: Once | INTRAMUSCULAR | Status: DC | PRN

## 2021-02-14 MED ORDER — MIDAZOLAM HCL 2 MG/ML PO SYRP: 8.0000 mg | ORAL_SOLUTION | Freq: Once | ORAL | Status: DC | PRN

## 2021-02-14 MED ORDER — HYDRALAZINE HCL 20 MG/ML IJ SOLN
INTRAMUSCULAR | Status: AC
  Filled 2021-02-14: qty 1

## 2021-02-14 MED ORDER — CEFAZOLIN SODIUM-DEXTROSE 1-4 GM/50ML-% IV SOLN
1.0000 g | Freq: Once | INTRAVENOUS | Status: AC
  Administered 2021-02-14: 1 g via INTRAVENOUS

## 2021-02-14 MED ORDER — ONDANSETRON HCL 4 MG/2ML IJ SOLN: 4.0000 mg | Freq: Four times a day (QID) | INTRAMUSCULAR | Status: DC | PRN

## 2021-02-14 SURGICAL SUPPLY — 58 items
APPLIER CLIP 11 MED OPEN (CLIP)
APPLIER CLIP 9.375 SM OPEN (CLIP)
BAG DECANTER FOR FLEXI CONT (MISCELLANEOUS) ×2 IMPLANT
BLADE SURG 15 STRL LF DISP TIS (BLADE) ×1 IMPLANT
BLADE SURG 15 STRL SS (BLADE) ×1
BLADE SURG SZ11 CARB STEEL (BLADE) ×2 IMPLANT
BOOT SUTURE AID YELLOW STND (SUTURE) ×2 IMPLANT
BRUSH SCRUB EZ  4% CHG (MISCELLANEOUS) ×1
BRUSH SCRUB EZ 4% CHG (MISCELLANEOUS) ×1 IMPLANT
CHLORAPREP W/TINT 26 (MISCELLANEOUS) ×2 IMPLANT
CLIP APPLIE 11 MED OPEN (CLIP) IMPLANT
CLIP APPLIE 9.375 SM OPEN (CLIP) IMPLANT
DECANTER SPIKE VIAL GLASS SM (MISCELLANEOUS) ×2 IMPLANT
DERMABOND ADVANCED (GAUZE/BANDAGES/DRESSINGS) ×1
DERMABOND ADVANCED .7 DNX12 (GAUZE/BANDAGES/DRESSINGS) ×1 IMPLANT
DRESSING SURGICEL FIBRLLR 1X2 (HEMOSTASIS) ×1 IMPLANT
DRSG SURGICEL FIBRILLAR 1X2 (HEMOSTASIS) ×2
ELECT CAUTERY BLADE 6.4 (BLADE) ×2 IMPLANT
ELECT REM PT RETURN 9FT ADLT (ELECTROSURGICAL) ×2
ELECTRODE REM PT RTRN 9FT ADLT (ELECTROSURGICAL) ×1 IMPLANT
GAUZE 4X4 16PLY ~~LOC~~+RFID DBL (SPONGE) ×2 IMPLANT
GEL ULTRASOUND 20GR AQUASONIC (MISCELLANEOUS) ×2 IMPLANT
GLOVE SURG ENC MOIS LTX SZ7 (GLOVE) ×2 IMPLANT
GLOVE SURG SYN 8.0 (GLOVE) ×2 IMPLANT
GLOVE SURG UNDER LTX SZ7.5 (GLOVE) ×2 IMPLANT
GOWN STRL REUS W/ TWL LRG LVL3 (GOWN DISPOSABLE) ×1 IMPLANT
GOWN STRL REUS W/ TWL XL LVL3 (GOWN DISPOSABLE) ×2 IMPLANT
GOWN STRL REUS W/TWL LRG LVL3 (GOWN DISPOSABLE) ×1
GOWN STRL REUS W/TWL XL LVL3 (GOWN DISPOSABLE) ×2
IV NS 500ML (IV SOLUTION) ×1
IV NS 500ML BAXH (IV SOLUTION) ×1 IMPLANT
KIT TURNOVER KIT A (KITS) ×2 IMPLANT
LABEL OR SOLS (LABEL) ×2 IMPLANT
LOOP RED MAXI  1X406MM (MISCELLANEOUS) ×1
LOOP VESSEL MAXI 1X406 RED (MISCELLANEOUS) ×1 IMPLANT
LOOP VESSEL MINI 0.8X406 BLUE (MISCELLANEOUS) ×2 IMPLANT
LOOPS BLUE MINI 0.8X406MM (MISCELLANEOUS) ×2
MANIFOLD NEPTUNE II (INSTRUMENTS) ×2 IMPLANT
NEEDLE FILTER BLUNT 18X 1/2SAF (NEEDLE) ×1
NEEDLE FILTER BLUNT 18X1 1/2 (NEEDLE) ×1 IMPLANT
NS IRRIG 500ML POUR BTL (IV SOLUTION) ×2 IMPLANT
PACK EXTREMITY ARMC (MISCELLANEOUS) ×2 IMPLANT
PAD PREP 24X41 OB/GYN DISP (PERSONAL CARE ITEMS) ×2 IMPLANT
STOCKINETTE STRL 4IN 9604848 (GAUZE/BANDAGES/DRESSINGS) ×2 IMPLANT
SUT MNCRL+ 5-0 UNDYED PC-3 (SUTURE) ×1 IMPLANT
SUT MONOCRYL 5-0 (SUTURE) ×1
SUT PROLENE 6 0 BV (SUTURE) ×8 IMPLANT
SUT SILK 2 0 (SUTURE) ×1
SUT SILK 2-0 18XBRD TIE 12 (SUTURE) ×1 IMPLANT
SUT SILK 3 0 (SUTURE) ×1
SUT SILK 3-0 18XBRD TIE 12 (SUTURE) ×1 IMPLANT
SUT SILK 4 0 (SUTURE) ×1
SUT SILK 4-0 18XBRD TIE 12 (SUTURE) ×1 IMPLANT
SUT VIC AB 3-0 SH 27 (SUTURE) ×2
SUT VIC AB 3-0 SH 27X BRD (SUTURE) ×2 IMPLANT
SYR 20ML LL LF (SYRINGE) ×2 IMPLANT
SYR 3ML LL SCALE MARK (SYRINGE) ×2 IMPLANT
WATER STERILE IRR 500ML POUR (IV SOLUTION) ×2 IMPLANT

## 2021-02-14 SURGICAL SUPPLY — 14 items
CANNULA 5F STIFF (CANNULA) ×4 IMPLANT
CATH BEACON 5 .035 40 KMP TP (CATHETERS) ×1 IMPLANT
CATH BEACON 5 .038 40 KMP TP (CATHETERS) ×1
CATH GLIDEPATH RETRO 14.5X19 (CATHETERS) ×2 IMPLANT
COVER PROBE U/S 5X48 (MISCELLANEOUS) ×2 IMPLANT
DERMABOND ADVANCED (GAUZE/BANDAGES/DRESSINGS) ×1
DERMABOND ADVANCED .7 DNX12 (GAUZE/BANDAGES/DRESSINGS) ×1 IMPLANT
DRAPE BRACHIAL (DRAPES) ×2 IMPLANT
GUIDEWIRE ANGLED .035 180CM (WIRE) ×2 IMPLANT
NEEDLE ENTRY 21GA 7CM ECHOTIP (NEEDLE) ×2 IMPLANT
PACK ANGIOGRAPHY (CUSTOM PROCEDURE TRAY) ×2 IMPLANT
SHEATH BRITE TIP 6FRX5.5 (SHEATH) ×2 IMPLANT
SUT MNCRL AB 4-0 PS2 18 (SUTURE) ×4 IMPLANT
SUT SILK 0 FSL (SUTURE) ×2 IMPLANT

## 2021-02-14 NOTE — Anesthesia Preprocedure Evaluation (Signed)
Anesthesia Evaluation  Patient identified by MRN, date of birth, ID band Patient awake    Reviewed: Allergy & Precautions, NPO status , Patient's Chart, lab work & pertinent test results  Airway Mallampati: III  TM Distance: >3 FB Neck ROM: full    Dental  (+) Edentulous Upper, Edentulous Lower   Pulmonary pneumonia, resolved, Current Smoker and Patient abstained from smoking.,    Pulmonary exam normal        Cardiovascular hypertension, + Peripheral Vascular Disease  Normal cardiovascular exam     Neuro/Psych PSYCHIATRIC DISORDERS Schizophrenia negative neurological ROS     GI/Hepatic negative GI ROS, Neg liver ROS,   Endo/Other  negative endocrine ROS  Renal/GU Dialysis and CRFRenal disease     Musculoskeletal   Abdominal   Peds  Hematology  (+) Blood dyscrasia, anemia ,   Anesthesia Other Findings Past Medical History: No date: Anemia No date: Dialysis patient [facility])     Comment:  Mon. 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 No date: ESRD (end stage renal disease) ([place]) No date: Hyperlipidemia No date: Malignant hypertension 2016: Pneumonia No date: Renal artery stenosis (HCC) No date: Schizophrenia [facility])  Past Surgical History: 08/06/2016: A/V FISTULAGRAM; N/A     Comment:  Procedure: A/V Fistulagram;  Surgeon: Algernon Huxley, MD;                Location: [place] [facility];  Service: Cardiovascular;              Laterality: N/A; 08/06/2016: A/V SHUNT INTERVENTION; N/A     Comment:  Procedure: A/V Shunt Intervention;  Surgeon: Algernon Huxley, MD;  Location: [place] [facility];  Service:               Cardiovascular;  Laterality: N/A; 12/26/2014: AV FISTULA PLACEMENT; Left     Comment:  Procedure: ARTERIOVENOUS (AV) FISTULA CREATION;                Surgeon: Algernon Huxley, MD;  Location: [facility] ORS;  Service:                Vascular;  Laterality: Left; 05/06/2017: ESOPHAGOGASTRODUODENOSCOPY (EGD) WITH PROPOFOL; N/A     Comment:  Procedure: ESOPHAGOGASTRODUODENOSCOPY (EGD) WITH               PROPOFOL;  Surgeon: Jonathon Bellows, MD;  Location: [facility]               ENDOSCOPY;  Service: Gastroenterology;  Laterality: N/A; No date: INSERTION OF DIALYSIS CATHETER; Right 10/22/2014: PERIPHERAL VASCULAR CATHETERIZATION; N/A     Comment:  Procedure: Dialysis/Perma Catheter Insertion;  Surgeon:               Algernon Huxley, MD;  Location: [place] [facility];                Service: Cardiovascular;  Laterality: N/A; 02/28/2015: PERIPHERAL VASCULAR CATHETERIZATION; N/A     Comment:  Procedure: Dialysis/Perma Catheter Removal;  Surgeon:               Algernon Huxley, MD;  Location: [place] [facility];                Service: Cardiovascular;  Laterality: N/A; No date: Repair fx left lower leg     Comment:  yrs ago (  age 60) No date: TONSILLECTOMY  BMI    Body Mass Index: 19.37 kg/m      Reproductive/Obstetrics negative OB ROS                             Anesthesia Physical Anesthesia Plan  ASA: 3  Anesthesia Plan: General ETT   Post-op Pain Management:    Induction: Intravenous  PONV Risk Score and Plan: Ondansetron, Dexamethasone, Midazolam and Treatment may vary due to age or medical condition  Airway Management Planned: Oral ETT  Additional Equipment:   Intra-op Plan:   Post-operative Plan: Extubation in OR  Informed Consent: I have reviewed the patients History and Physical, chart, labs and discussed the procedure including the risks, benefits and alternatives for the proposed anesthesia with the patient or authorized representative who has indicated his/her understanding and acceptance.     Dental Advisory Given  Plan Discussed with: Anesthesiologist, CRNA and Surgeon  Anesthesia Plan Comments: (Patient consented for risks of anesthesia including but not limited to:  -  adverse reactions to medications - damage to eyes, teeth, lips or other oral mucosa - nerve damage due to positioning  - sore throat or hoarseness - Damage to heart, brain, nerves, lungs, other parts of body or loss of life  Patient voiced understanding.)        Anesthesia Quick Evaluation

## 2021-02-14 NOTE — OR Nursing (Signed)
Message sent to [facility] and Dr Delana Meyer regarding elevated BP (191/92).

## 2021-02-14 NOTE — Progress Notes (Signed)
Discharge instructions will be given to group home caregiver Richarda Blade before discharge

## 2021-02-14 NOTE — H&P (Addendum)
$'@LOGO'n$ @   MRN : [id number]  Todd Brown is a 60 y.o. ([birth year]) male who presents with chief complaint of access issues.  History of Present Illness:   The patient returns to the office for follow up regarding problem with the dialysis access. Currently the patient is maintained via a left brachiocephalic fistula.  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  This does raise concerns that his dialysis center he has been sent for definitive treatment.  There is documented increase in recirculation secondary to large aneurysms.  The patient denies hand pain or other symptoms consistent with steal phenomena.  No significant arm swelling.  The patient denies redness or swelling at the access site. The patient denies fever or chills at home or while on dialysis.  The patient denies amaurosis fugax or recent TIA symptoms. There are no recent neurological changes noted. The patient denies claudication symptoms or rest pain symptoms. The patient denies history of DVT, PE or superficial thrombophlebitis. The patient denies recent episodes of angina or shortness of breath.      No outpatient medications have been marked as taking for the 02/14/21 encounter [facility] Encounter).    Past Medical History:  Diagnosis Date   Anemia    Dialysis patient [facility])    Mon. -Wed.- Fri   DVT (deep venous thrombosis) ([place])    cephalic and basolic vein thrombosis   ESRD (end stage renal disease) ([place])    Hyperlipidemia    Malignant hypertension    Pneumonia 2016   Renal artery stenosis (HCC)    Schizophrenia (HCC)     Past Surgical History:  Procedure Laterality Date   A/V FISTULAGRAM N/A 08/06/2016   Procedure: A/V Fistulagram;  Surgeon: Algernon Huxley, MD;  Location: [place] [facility];  Service: Cardiovascular;  Laterality: N/A;   A/V SHUNT INTERVENTION N/A 08/06/2016   Procedure:  A/V Shunt Intervention;  Surgeon: Algernon Huxley, MD;  Location: [place] [facility];  Service: Cardiovascular;  Laterality: N/A;   AV FISTULA PLACEMENT Left 12/26/2014   Procedure: ARTERIOVENOUS (AV) FISTULA CREATION;  Surgeon: Algernon Huxley, MD;  Location: [facility] ORS;  Service: Vascular;  Laterality: Left;   ESOPHAGOGASTRODUODENOSCOPY (EGD) WITH PROPOFOL N/A 05/06/2017   Procedure: ESOPHAGOGASTRODUODENOSCOPY (EGD) WITH PROPOFOL;  Surgeon: Jonathon Bellows, MD;  Location: [facility] ENDOSCOPY;  Service: Gastroenterology;  Laterality: N/A;   INSERTION OF DIALYSIS CATHETER Right    PERIPHERAL VASCULAR CATHETERIZATION N/A 10/22/2014   Procedure: Dialysis/Perma Catheter Insertion;  Surgeon: Algernon Huxley, MD;  Location: [place] [facility];  Service: Cardiovascular;  Laterality: N/A;   PERIPHERAL VASCULAR CATHETERIZATION N/A 02/28/2015   Procedure: Dialysis/Perma Catheter Removal;  Surgeon: Algernon Huxley, MD;  Location: [place] [facility];  Service: Cardiovascular;  Laterality: N/A;   Repair fx left lower leg     yrs ago (age 57)   TONSILLECTOMY      Social History Social History   Tobacco Use   Smoking status: Every Day    Packs/day: 0.50    Types: Cigarettes   Smokeless tobacco: Never   Tobacco comments:    10 cigars a day  Vaping Use   Vaping Use: Never used  Substance Use Topics   Alcohol use: No   Drug use: Yes    Types: Marijuana    Comment: not since living at assisted living place 03/2020    Family History  Family History  Problem Relation Age of Onset   Kidney disease Brother    Diabetes Neg Hx     Allergies  Allergen Reactions   Chlorpromazine Other (See Comments)    Reaction:  Unknown , pt states it makes him feel real bad Reaction:  Unknown , pt states it makes him feel real bad      REVIEW OF SYSTEMS (Negative unless checked)  Constitutional: '[]'$ Weight loss  '[]'$ Fever  '[]'$ Chills Cardiac: '[]'$ Chest pain   '[]'$ Chest pressure   '[]'$ Palpitations   '[]'$ Shortness of breath when laying flat    '[]'$ Shortness of breath with exertion. Vascular:  '[]'$ Pain in legs with walking   '[]'$ Pain in legs at rest  '[]'$ History of DVT   '[]'$ Phlebitis   '[]'$ Swelling in legs   '[]'$ Varicose veins   '[]'$ Non-healing ulcers Pulmonary:   '[]'$ Uses home oxygen   '[]'$ Productive cough   '[]'$ Hemoptysis   '[]'$ Wheeze  '[]'$ COPD   '[]'$ Asthma Neurologic:  '[]'$ Dizziness   '[]'$ Seizures   '[]'$ History of stroke   '[]'$ History of TIA  '[]'$ Aphasia   '[]'$ Vissual changes   '[]'$ Weakness or numbness in arm   '[]'$ Weakness or numbness in leg Musculoskeletal:   '[]'$ Joint swelling   '[]'$ Joint pain   '[]'$ Low back pain Hematologic:  '[]'$ Easy bruising  '[]'$ Easy bleeding   '[]'$ Hypercoagulable state   '[]'$ Anemic Gastrointestinal:  '[]'$ Diarrhea   '[]'$ Vomiting  '[]'$ Gastroesophageal reflux/heartburn   '[]'$ Difficulty swallowing. Genitourinary:  '[x]'$ Chronic kidney disease   '[]'$ Difficult urination  '[]'$ Frequent urination   '[]'$ Blood in urine Skin:  '[]'$ Rashes   '[]'$ Ulcers  Psychological:  '[]'$ History of anxiety   '[]'$  History of major depression.  Physical Examination  Vitals:   02/14/21 1106  Pulse: 63   There is no height or weight on file to calculate BMI. Gen: WD/WN, NAD Head: [place]/AT, No temporalis wasting.  Ear/Nose/Throat: Hearing grossly intact, nares w/o erythema or drainage Eyes: PER, EOMI, sclera nonicteric.  Neck: Supple, no gross masses or lesions.  No JVD.  Pulmonary:  Good air movement, no audible wheezing, no use of accessory muscles.  Cardiac: RRR, precordium non-hyperdynamic. Vascular:    Left brachiocephalic fistula with very large aneurysms the skin is depigmented thinned and shining all indications of impending rupture. Vessel Right Left  Radial Palpable Palpable  Brachial Palpable Palpable  Gastrointestinal: soft, non-distended. No guarding/no peritoneal signs.  Musculoskeletal: M/S 5/5 throughout.  No deformity.  Neurologic: CN 2-12 intact. Pain and light touch intact in extremities.  Symmetrical.  Speech is fluent. Motor exam as listed above. Psychiatric: Judgment intact, Mood & affect  appropriate for pt's clinical situation. Dermatologic: No rashes or ulcers noted.  No changes consistent with cellulitis.   CBC Lab Results  Component Value Date   WBC 4.8 02/06/2021   HGB 9.5 (L) 02/14/2021   HCT 28.0 (L) 02/14/2021   MCV 97.2 02/06/2021   PLT 210 02/06/2021    BMET    Component Value Date/Time   NA 137 02/14/2021 0858   K 3.4 (L) 02/14/2021 0858   CL 96 (L) 02/14/2021 0858   CO2 30 02/06/2021 1307   GLUCOSE 87 02/14/2021 0858   BUN 18 02/14/2021 0858   CREATININE 4.20 (H) 02/14/2021 0858   CREATININE 3.47 (H) 03/23/2013 1501   CALCIUM 9.3 02/06/2021 1307   CALCIUM 8.1 (L) 10/22/2014 0504   GFRNONAA 11 (L) 02/06/2021 1307   GFRAA 7 (L) 10/23/2019 0405   Estimated Creatinine Clearance: 18.6 mL/min (A) (by C-G formula based on SCr of 4.2 mg/dL (H)).  COAG Lab Results  Component Value Date  INR 1.0 02/06/2021   INR 0.9 01/20/2021   INR 3.52 05/01/2017    Radiology No results found.   Assessment/Plan  1.  Complication of AV access with aneurysmal deterioration and potential rupture: Patient should undergo a fistulogram to plan for revision.  Central venous anatomy needs to be a evaluated to ensure there are no high-grade strictures or stenoses that would cause thrombosis of the revised access.  Also the arterial inflow will be assessed.  Once this is done a tunnel catheter will be placed as he will require interim catheter-based dialysis while his revision it was.  Once these 2 procedures been accomplished plans will be made for operative revision.  2.  End-stage renal disease: At the present time the patient has adequate dialysis access.  Continue hemodialysis as ordered without interruption.  Avoid nephrotoxic medications and dehydration.  Further plans per nephrology.  3.  Hypertension: Continue antihypertensive medications as already ordered, these medications have been reviewed and there are no changes at this time.   4.  COPD: Continue  pulmonary medications and aerosols as already ordered, these medications have been reviewed and there are no changes at this time.     Hortencia Pilar, MD  02/14/2021 11:06 AM

## 2021-02-14 NOTE — OR Nursing (Signed)
Left message with Care manager social services with procedure performed and pending discharge at 2:15 pm with group home caregiver Richarda Blade. Richarda Blade notified of estimated discharge time. She will get discharge instructions

## 2021-02-14 NOTE — Op Note (Signed)
OPERATIVE NOTE   PROCEDURE: Insertion of tunneled dialysis catheter right IJ approach with ultrasound and fluoroscopic guidance. Contrast injection left arm brachiocephalic fistula  PRE-OPERATIVE DIAGNOSIS: Complication left brachiocephalic fistula with aneurysmal deterioration poor dialysis and deterioration of the skin risking rupture.    POSTOPERATIVE DIAGNOSIS: Same  SURGEON: Hortencia Pilar.  ANESTHESIA: Conscious sedation was administered under my direct supervision by the interventional radiology RN. IV Versed plus fentanyl were utilized. Continuous ECG, pulse oximetry and blood pressure was monitored throughout the entire procedure. Conscious sedation was for a total of a 1 hour 13 minutes and 31 seconds.  ESTIMATED BLOOD LOSS: Minimal cc  CONTRAST USED: 35 cc  FLUOROSCOPY TIME: 2.4 minutes  INDICATIONS:   Todd Brown a 60 y.o. y.o. male who presents with worsening dialysis function of his left arm AV graft.  He has very large aneurysms with deterioration of the skin and there is concern that he is at a high risk for rupture.  Risks and benefits for evaluation of his fistula subsequent placement of a catheter in preparation for revision are reviewed with his state guardian.  All questions were answered.  Guardian has given [place] permission to proceed..  DESCRIPTION: After obtaining full informed written consent, the patient was positioned supine.  His left arm was extended palm upward.  Left arm was then prepped and draped in sterile fashion.  Ultrasound is placed in a sterile sleeve.  Ultrasound was utilized to evaluate the fistula.  Fistula is noted to be echolucent and compressible indicating patency.  Image is recorded for the permanent record.  Under direct ultrasound visualization after 1% my lidocaine has been infiltrated in soft tissues a microneedle was inserted followed by microwire and then a micro sheath.  Initial access appears to have pulled out of the fistula and  therefore a pursestring suture was placed around the micro sheath and it was removed and pressure was held.  A new site on the fistula was selected and access was obtained in similar fashion with the ultrasound image was recorded puncture was under direct visualization.  This access was obtained in the retrograde direction.  6 French sheath was then placed.  Using a 40 cm Kumpe catheter and a floppy Glidewire the catheter and wire were negotiated into the proximal brachial artery.  Once these images have been obtained the catheter and wire were removed the sheath was then flipped into the antegrade direction and the Glidewire advanced.  The sheath was then advanced to better seated.  Imaging of the proximal portion of the fistula as well as the central veins was then obtained.  Once these were reviewed pursestring suture was placed around the 6 French sheath that was removed light pressure was held and there were no complications at this site.  Diagnostic interpretation: Imaging of the arterial system from the proximal brachial artery demonstrates a dilated radial artery.  There is a high radial artery brachial artery bifurcation.  There is distal filling of the radial artery.  Dominant runoff to the hand is via the brachial artery which demonstrates the typical bifurcation of the interosseous and the ulnar artery below the level of the antecubital crease.  There is a focal greater than 70% stenosis in the radial artery approximately 6 cm proximal to the anastomosis of the fistula.  The fistula itself demonstrates very large aneurysms and extreme tortuosity.  However the proximal cephalic vein in the cephalic subclavian confluence appear widely patent.  The subclavian and Ottoman superior vena cava are widely  patent.  Attention was then turned to securing catheter-based access in preparation for revision  The right was prepped and draped in a sterile fashion for a right IJ tunneled catheter. Ultrasound  was placed in a sterile sleeve. Ultrasound was utilized to identify the right internal jugular vein which is noted to be echolucent and compressible indicating patency. Image is recorded for the permanent record. Under direct ultrasound visualization a micro-needle is inserted into the vein followed by the micro-wire. Micro-sheath was then advanced and a J wire is inserted without difficulty under fluoroscopic guidance. Small counterincision was made at the wire insertion site. Dilators are passed over the wire and the tunneled dialysis catheter is fed into the central venous system without difficulty.  Under fluoroscopy the catheter tip positioned at the atrial caval junction. The catheter is then approximated to the right chest wall and an exit site selected. 1% lidocaine is infiltrated in soft tissues at this level small incision is made and the tunneling device is then passed from the exit site to the right neck counterincision. Catheter is then connected to the tunneling device and the catheter was pulled subcutaneously. It is then transected and the hub assembly connected without difficulty. Both lumens aspirate and flush easily. After verification of smooth contour with proper tip position under fluoroscopy the catheter is packed with 5000 units of heparin per lumen.  Catheter secured to the skin of the right chest wall with 0 silk. A sterile dressing is applied with a Biopatch.  COMPLICATIONS: None  CONDITION: Good  Hortencia Pilar [place] [facility]. Office:  [phone]   02/14/2021,5:04 PM

## 2021-02-14 NOTE — OR Nursing (Signed)
Patient is being transferred to specials for a change in procedure per Dr. Delana Meyer.

## 2021-02-14 NOTE — OR Nursing (Signed)
Po clonidine 03. Po give for hypertension prior to discharge per order from Dr Delana Meyer.

## 2021-02-14 NOTE — Interval H&P Note (Signed)
History and Physical Interval Note:  02/14/2021 11:22 AM  [place]  has presented today for surgery, with the diagnosis of Fistulagram with poss Perm Cath Insertion   ESRD.  The various methods of treatment have been discussed with the patient and family. After consideration of risks, benefits and other options for treatment, the patient has consented to  Procedure(s): A/V FISTULAGRAM poss Perm Cath Insertion (N/A) as a surgical intervention.  The patient's history has been reviewed, patient examined, no change in status, stable for surgery.  I have reviewed the patient's chart and labs.  Questions were answered to the patient's satisfaction.     Todd Brown

## 2021-02-17 ENCOUNTER — Encounter: Payer: Self-pay | Admitting: Vascular Surgery

## 2021-02-18 ENCOUNTER — Emergency Department: Payer: Medicaid Other

## 2021-02-18 ENCOUNTER — Encounter: Payer: Self-pay | Admitting: Emergency Medicine

## 2021-02-18 ENCOUNTER — Other Ambulatory Visit: Payer: Self-pay

## 2021-02-18 ENCOUNTER — Emergency Department
Admission: EM | Admit: 2021-02-18 | Discharge: 2021-02-18 | Disposition: A | Discharge status: Home / Self Care | Payer: Medicaid Other | Attending: Emergency Medicine | Admitting: Emergency Medicine

## 2021-02-18 DIAGNOSIS — N186 End stage renal disease: Secondary | ICD-10-CM | POA: Diagnosis not present

## 2021-02-18 DIAGNOSIS — S40022A Contusion of left upper arm, initial encounter: Secondary | ICD-10-CM | POA: Insufficient documentation

## 2021-02-18 DIAGNOSIS — X58XXXA Exposure to other specified factors, initial encounter: Secondary | ICD-10-CM | POA: Insufficient documentation

## 2021-02-18 DIAGNOSIS — F17210 Nicotine dependence, cigarettes, uncomplicated: Secondary | ICD-10-CM | POA: Diagnosis not present

## 2021-02-18 DIAGNOSIS — M7989 Other specified soft tissue disorders: Secondary | ICD-10-CM

## 2021-02-18 DIAGNOSIS — Z7901 Long term (current) use of anticoagulants: Secondary | ICD-10-CM | POA: Insufficient documentation

## 2021-02-18 DIAGNOSIS — Z79899 Other long term (current) drug therapy: Secondary | ICD-10-CM | POA: Diagnosis not present

## 2021-02-18 DIAGNOSIS — I120 Hypertensive chronic kidney disease with stage 5 chronic kidney disease or end stage renal disease: Secondary | ICD-10-CM | POA: Diagnosis not present

## 2021-02-18 DIAGNOSIS — Z992 Dependence on renal dialysis: Secondary | ICD-10-CM | POA: Diagnosis not present

## 2021-02-18 LAB — BASIC METABOLIC PANEL
Anion gap: 13 (ref 5–15)
BUN: 30 mg/dL — ABNORMAL HIGH (ref 6–20)
CO2: 31 mmol/L (ref 22–32)
Calcium: 9.7 mg/dL (ref 8.9–10.3)
Chloride: 95 mmol/L — ABNORMAL LOW (ref 98–111)
Creatinine, Ser: 6.65 mg/dL — ABNORMAL HIGH (ref 0.61–1.24)
GFR, Estimated: 9 mL/min — ABNORMAL LOW (ref >60)
Glucose, Bld: 76 mg/dL (ref 70–99)
Potassium: 4 mmol/L (ref 3.5–5.1)
Sodium: 139 mmol/L (ref 135–145)

## 2021-02-18 LAB — CBC WITH DIFFERENTIAL/PLATELET
Abs Immature Granulocytes: 0.03 10*3/uL (ref 0.00–0.07)
Basophils Absolute: 0 10*3/uL (ref 0.0–0.1)
Basophils Relative: 0 %
Eosinophils Absolute: 0.2 10*3/uL (ref 0.0–0.5)
Eosinophils Relative: 3 %
HCT: 25.3 % — ABNORMAL LOW (ref 39.0–52.0)
Hemoglobin: 8.8 g/dL — ABNORMAL LOW (ref 13.0–17.0)
Immature Granulocytes: 0 %
Lymphocytes Relative: 16 %
Lymphs Abs: 1.1 10*3/uL (ref 0.7–4.0)
MCH: 35.1 pg — ABNORMAL HIGH (ref 26.0–34.0)
MCHC: 34.8 g/dL (ref 30.0–36.0)
MCV: 100.8 fL — ABNORMAL HIGH (ref 80.0–100.0)
Monocytes Absolute: 0.8 10*3/uL (ref 0.1–1.0)
Monocytes Relative: 11 %
Neutro Abs: 5.1 10*3/uL (ref 1.7–7.7)
Neutrophils Relative %: 70 %
Platelets: 151 10*3/uL (ref 150–400)
RBC: 2.51 MIL/uL — ABNORMAL LOW (ref 4.22–5.81)
RDW: 16.3 % — ABNORMAL HIGH (ref 11.5–15.5)
WBC: 7.2 10*3/uL (ref 4.0–10.5)
nRBC: 0 % (ref 0.0–0.2)

## 2021-02-18 MED ORDER — CLONIDINE HCL 0.1 MG PO TABS
0.3000 mg | ORAL_TABLET | ORAL | Status: AC
  Administered 2021-02-18: 0.3 mg via ORAL
  Filled 2021-02-18: qty 3

## 2021-02-18 MED ORDER — OXYCODONE HCL 5 MG PO TABS
5.0000 mg | ORAL_TABLET | ORAL | Status: AC
  Administered 2021-02-18: 5 mg via ORAL
  Filled 2021-02-18: qty 1

## 2021-02-18 NOTE — ED Provider Notes (Signed)
[facility] Emergency Department Provider Note  ____________________________________________  Time seen: Approximately 6:20 PM  I have reviewed the triage vital signs and the nursing notes.   HISTORY  Chief Complaint Arm Swelling  Level 5 Caveat: Portions of the History and Physical including HPI and review of systems are unable to be completely obtained due to patient being a poor historian    HPI Todd Brown is a 60 y.o. male with a history of ESRD on hemodialysis, schizophrenia who was brought to the ED for evaluation of left arm swelling.  He was noted to have left upper extremity AV fistula dysfunction, underwent fistulogram 4 days ago and right tunneled IJ dialysis catheter placement by Dr. Delana Meyer.  The patient denies any pain, no chest pain or shortness of breath, no arm numbness or tingling or hand pain or weakness.  Denies any acute symptoms.    Past Medical History:  Diagnosis Date  . Anemia   . Dialysis patient [facility])    Mon. -Wed.- Fri  . 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 ESRD (end stage renal disease) ([place])   . Hyperlipidemia   . Malignant hypertension   . Pneumonia 2016  . Renal artery stenosis ([place])   . Schizophrenia [facility])      Patient Active Problem List   Diagnosis Date Noted  . Hypertension 11/28/2020  . Acute respiratory failure with hypoxia ([place]) 10/23/2019  . Anemia of chronic disease 10/23/2019  . Fluid overload 10/08/2018  . Acute pulmonary edema ([place]) 09/12/2018  . Scrotal edema 05/10/2017  . Symptomatic anemia 04/30/2017  . Anticoagulated on Coumadin 09/14/2016  . Cardiac arrest ([place]) 08/03/2016  . Sepsis ([place]) 05/26/2016  . Dialysis patient ([place]) 04/15/2016  . End stage renal disease ([place]) 02/12/2016  . Hyperkalemia 05/20/2015  . Hepatitis C 09/26/2013  . Schizophrenia ([place]) 03/23/2013  . Essential hypertension, benign 03/23/2013  . Chronic kidney disease  03/23/2013     Past Surgical History:  Procedure Laterality Date  . A/V FISTULAGRAM N/A 08/06/2016   Procedure: A/V Fistulagram;  Surgeon: Algernon Huxley, MD;  Location: [place] CV LAB;  Service: Cardiovascular;  Laterality: N/A;  . A/V FISTULAGRAM N/A 02/14/2021   Procedure: A/V FISTULAGRAM poss Perm Cath Insertion;  Surgeon: Katha Cabal, MD;  Location: [place] CV [facility];  Service: Cardiovascular;  Laterality: N/A;  . A/V SHUNT INTERVENTION N/A 08/06/2016   Procedure: A/V Shunt Intervention;  Surgeon: Algernon Huxley, MD;  Location: [place] CV [facility];  Service: Cardiovascular;  Laterality: N/A;  . AV FISTULA PLACEMENT Left 12/26/2014   Procedure: ARTERIOVENOUS (AV) FISTULA CREATION;  Surgeon: Algernon Huxley, MD;  Location: [facility] ORS;  Service: Vascular;  Laterality: Left;  . ESOPHAGOGASTRODUODENOSCOPY (EGD) WITH PROPOFOL N/A 05/06/2017   Procedure: ESOPHAGOGASTRODUODENOSCOPY (EGD) WITH PROPOFOL;  Surgeon: Jonathon Bellows, MD;  Location: [facility] ENDOSCOPY;  Service: Gastroenterology;  Laterality: N/A;  . INSERTION OF DIALYSIS CATHETER Right   . PERIPHERAL VASCULAR CATHETERIZATION N/A 10/22/2014   Procedure: Dialysis/Perma Catheter Insertion;  Surgeon: Algernon Huxley, MD;  Location: [place] [facility];  Service: Cardiovascular;  Laterality: N/A;  . PERIPHERAL VASCULAR CATHETERIZATION N/A 02/28/2015   Procedure: Dialysis/Perma Catheter Removal;  Surgeon: Algernon Huxley, MD;  Location: [place] CV [facility];  Service: Cardiovascular;  Laterality: N/A;  . Repair fx left lower leg     yrs ago (age 22)  . TONSILLECTOMY       Prior to Admission medications  Medication Sig Start Date End Date Taking? Authorizing Provider  acetaminophen (TYLENOL) 500 MG tablet Take 500-1,000 mg by mouth every 6 (six) hours as needed for mild pain or moderate pain.    [provider]  albuterol (VENTOLIN HFA) 108 (90 Base) MCG/ACT inhaler Inhale 1-2 puffs into the lungs every 4 (four) hours as needed for  wheezing or shortness of breath.     [provider]  amLODipine (NORVASC) 10 MG tablet Take 1 tablet (10 mg total) by mouth daily. 09/13/18   Bettey Costa, MD  calcium acetate (PHOSLO) 667 MG capsule Take 2 capsules (1,334 mg total) by mouth 3 (three) times daily with meals. 05/19/15   Fritzi Mandes, MD  carbamazepine (TEGRETOL) 200 MG tablet Take 1,000 mg by mouth daily.    [provider]  cholecalciferol (VITAMIN D) 1000 UNITS tablet Take 1,000 Units by mouth daily.    [provider]  cloNIDine (CATAPRES) 0.3 MG tablet Take 0.3 mg by mouth 3 (three) times daily.    [provider]  ferrous sulfate 325 (65 FE) MG tablet Take 1 tablet (325 mg total) by mouth daily. 05/19/15   Fritzi Mandes, MD  fluPHENAZine (PROLIXIN) 2.5 MG tablet Take 2.5 mg by mouth daily.    [provider]  Fluticasone-Salmeterol (ADVAIR) 250-50 MCG/DOSE AEPB Inhale 1 puff into the lungs 2 (two) times daily.     [provider]  hydrALAZINE (APRESOLINE) 100 MG tablet Take 1 tablet (100 mg total) by mouth 2 (two) times daily. 09/13/18   Bettey Costa, MD  meloxicam (MOBIC) 7.5 MG tablet Take 7.5 mg by mouth daily.    [provider]  multivitamin (RENA-VIT) TABS tablet Take 1 tablet by mouth daily.    [provider]  omeprazole (PRILOSEC) 40 MG capsule Take 1 capsule (40 mg total) by mouth daily. 05/06/17   Henreitta Leber, MD  tiZANidine (ZANAFLEX) 2 MG tablet Take 2 mg by mouth 3 (three) times daily as needed for muscle spasms.    [provider]  trihexyphenidyl (ARTANE) 2 MG tablet Take 2 mg by mouth 2 (two) times daily.    [provider]     Allergies Chlorpromazine   Family History  Problem Relation Age of Onset  . Kidney disease Brother   . Diabetes Neg Hx     Social History Social History   Tobacco Use  . Smoking status: Every Day    Packs/day: 0.50    Types: Cigarettes  . Smokeless tobacco: Never  . Tobacco comments:     10 cigars a day  Vaping Use  . Vaping Use: Never used  Substance Use Topics  . Alcohol use: No  . Drug use: Yes    Types: Marijuana    Comment: not since living at assisted living place 03/2020    Review of Systems  Constitutional:   No fever or chills.  ENT:   No sore throat. No rhinorrhea. Cardiovascular:   No chest pain or syncope. Respiratory:   No dyspnea or cough. Gastrointestinal:   Negative for abdominal pain, vomiting and diarrhea.  Musculoskeletal: Left arm swelling as above All other systems reviewed and are negative except as documented above in ROS and HPI.  ____________________________________________   PHYSICAL EXAM:  VITAL SIGNS: ED Triage Vitals  Enc Vitals Group     BP 02/18/21 1629 (!) 226/98     Pulse Rate 02/18/21 1230 83     Resp 02/18/21 1230 16     Temp  02/18/21 1230 98.2 F (36.8 C)     Temp Source 02/18/21 1230 Oral     SpO2 02/18/21 1230 98 %     Weight 02/18/21 1236 154 lb 15.7 oz (70.3 kg)     Height 02/18/21 1234 '6\' 3"'$  (1.905 m)     Head Circumference --      Peak Flow --      Pain Score 02/18/21 1231 10     Pain Loc --      Pain Edu? --      Excl. in Agency? --     Vital signs reviewed, nursing assessments reviewed.   Constitutional:   Alert and oriented. Non-toxic appearance. Eyes:   Conjunctivae are normal. EOMI. PERRL. ENT      Head:   Normocephalic and atraumatic.      Nose:   Wearing a mask.      Mouth/Throat:   Wearing a mask.      Neck:   No meningismus. Full ROM. Hematological/Lymphatic/Immunilogical:   No cervical lymphadenopathy. Cardiovascular:   RRR. Symmetric bilateral radial and DP pulses.  No murmurs. Cap refill less than 2 seconds.  Strong thrill in the left upper arm from axilla to elbow Respiratory:   Normal respiratory effort without tachypnea/retractions. Breath sounds are clear and equal bilaterally. No wheezes/rales/rhonchi. Gastrointestinal:   Soft and nontender. Non distended. There is no CVA tenderness.   No rebound, rigidity, or guarding. Genitourinary:   deferred Musculoskeletal:   Normal range of motion in all extremities. No joint effusions.  No lower extremity tenderness.  No edema.  Medial left upper arm is somewhat firm, but nontender and noninflamed.  Tissues are soft. Neurologic:   Normal speech and language.  Motor grossly intact. No acute focal neurologic deficits are appreciated.  Skin:    Skin is warm, dry and intact. No rash noted.  No petechiae, purpura, or bullae.  ____________________________________________    LABS (pertinent positives/negatives) (all labs ordered are listed, but only abnormal results are displayed) Labs Reviewed  CBC WITH DIFFERENTIAL/PLATELET - Abnormal; Notable for the following components:      Result Value   RBC 2.51 (*)    Hemoglobin 8.8 (*)    HCT 25.3 (*)    MCV 100.8 (*)    MCH 35.1 (*)    RDW 16.3 (*)    All other components within normal limits  BASIC METABOLIC PANEL - Abnormal; Notable for the following components:   Chloride 95 (*)    BUN 30 (*)    Creatinine, Ser 6.65 (*)    GFR, Estimated 9 (*)    All other components within normal limits   ____________________________________________   EKG    ____________________________________________    RADIOLOGY  [place] LT UPPER EXTREM LTD SOFT TISSUE NON VASCULAR  Result Date: 02/18/2021 CLINICAL DATA:  Left upper extremity swelling. Concern for hematoma or abscess around patient's AV fistula EXAM: ULTRASOUND LEFT UPPER EXTREMITY LIMITED TECHNIQUE: Ultrasound examination of the upper extremity soft tissues was performed in the area of clinical concern. COMPARISON:  None. FINDINGS: Targeted ultrasound was performed of the soft tissues of the left upper extremity at site of patient's clinical concern. There is a large heterogeneously hypoechoic collection adjacent to enlarged tortuous vascular structure, which is likely representative of patient's known AV fistula. The collection measures  approximately 7.9 x 4.4 x 4.6 cm. No internal vascularity is seen within the collection. IMPRESSION: Large heterogeneous collection within the left upper extremity measuring up to 7.9 cm, located adjacent to  patient's known AV fistula. Sonographic features are most suggestive of a hematoma. Electronically Signed   By: Davina Poke D.O.   On: 02/18/2021 14:31    ____________________________________________   PROCEDURES Procedures  ____________________________________________    CLINICAL IMPRESSION / ASSESSMENT AND PLAN / ED COURSE  Medications ordered in the ED: Medications  cloNIDine (CATAPRES) tablet 0.3 mg (has no administration in time range)  oxyCODONE (Oxy IR/ROXICODONE) immediate release tablet 5 mg (5 mg Oral Given 02/18/21 1247)    Pertinent labs & imaging results that were available during my care of the patient were reviewed by me and considered in my medical decision making (see chart for details).  Todd Brown was evaluated in Emergency Department on 02/18/2021 for the symptoms described in the history of present illness. He was evaluated in the context of the global COVID-19 pandemic, which necessitated consideration that the patient might be at risk for infection with the SARS-CoV-2 virus that causes COVID-19. Institutional protocols and algorithms that pertain to the evaluation of patients at risk for COVID-19 are in a state of rapid change based on information released by regulatory bodies including the CDC and federal and state organizations. These policies and algorithms were followed during the patient's care in the ED.   Patient presents with swelling of the left upper arm after recent percutaneous fistulogram.  Ultrasound shows an 8 cm hematoma.  No evidence of infection or ongoing bleeding, no compartment syndrome, doubt DVT or arterial occlusion.  Discussed with vascular Dr. Lucky Cowboy who recommends Ace wrap and follow-up.  Patient also markedly hypertensive on  reassessment 220/120 patient states he is due to being late for his dose of 0.3 mg clonidine.  I will give this in the ED, obtain a chest x-ray to ensure he is not having pulmonary edema and plan for discharge.  Clinical Course as of 02/18/21 1928  Tue Feb 18, 2021  1927 Chest x-ray overall unremarkable, stable for discharge. [PS]    Clinical Course User Index [PS] Carrie Mew, MD     ____________________________________________   FINAL CLINICAL IMPRESSION(S) / ED DIAGNOSES    Final diagnoses:  Arm swelling  Hematoma of arm, left, initial encounter  ESRD on hemodialysis [facility])     ED Discharge Orders     None       Portions of this note were generated with dragon dictation software. Dictation errors may occur despite best attempts at proofreading.    Carrie Mew, MD 02/18/21 [phone]

## 2021-02-18 NOTE — ED Notes (Signed)
Pt has L arm swelling & a blister near his new fistula site.

## 2021-02-18 NOTE — Discharge Instructions (Addendum)
Continue going to dialysis as scheduled and taking all of your medications as usual.

## 2021-02-18 NOTE — ED Provider Notes (Signed)
Emergency Medicine Provider Triage Evaluation Note  Todd Brown , a 60 y.o. male  was evaluated in triage.  Pt complains of left hand/arm swelling, s/p AV fistula replacement. Patient with ESRD on MWF dialysis had dialysis yesterday using his new port. He presents noted LUE swelling since his fistula surgery on Friday.   Review of Systems  Positive: Left hand swelling Negative: FCS, CP  Physical Exam  Pulse 83   Temp 98.2 F (36.8 C) (Oral)   Resp 16   Ht '6\' 3"'$  (1.905 m)   Wt 70.3 kg   SpO2 98%   BMI 19.37 kg/m  Gen:   Awake, no distress  NAD Resp:  Normal effort CTA MSK:   Moves extremities without difficulty. Right hand STS noted Other:  Skin: warm, dry, no induration, warmth, erthyema  Medical Decision Making  Medically screening exam initiated at 1:34 PM.  Appropriate orders placed.  Vidhur Maddex Stennis was informed that the remainder of the evaluation will be completed by another provider, this initial triage assessment does not replace that evaluation, and the importance of remaining in the ED until their evaluation is complete.  Patient on MWF dialysis for ESRD, with complaint of left hand STS.    Melvenia Needles, PA-C 02/18/21 1428    Carrie Mew, MD 02/18/21 Tresa Moore    Carrie Mew, MD 02/18/21 Jeri Lager

## 2021-02-18 NOTE — ED Notes (Signed)
This RN called [name] & they will be sending someone to pick pt up.

## 2021-02-18 NOTE — ED Notes (Signed)
This RN called [name] at [phone], no answer. Will attempt again, as pt is ready for discharge.

## 2021-02-18 NOTE — ED Triage Notes (Signed)
Pt to ED via POV with complaints of left arm swelling. Dr. Lysbeth Galas did a fistula gram and put a port in his chest on Friday, during the weekend he was having a little pain, but on Monday he noticed that his arm (left arm) was swelling.

## 2021-02-24 ENCOUNTER — Telehealth (INDEPENDENT_AMBULATORY_CARE_PROVIDER_SITE_OTHER): Payer: Self-pay

## 2021-02-24 NOTE — Telephone Encounter (Signed)
Someone from [facility] left voicemail stating that the patient is swollen and has skin tear interior outside of the arm. Patient had fistulagram with possible perm cath insertion on 02/14/21.Patient is schedule to come in 02/28/21. I spoke with Dr Delana Meyer and he recommended for the patient to elevate also we keep appointment as schedule. [facility] was made aware with medical recommendations and appointment .

## 2021-02-26 DIAGNOSIS — K219 Gastro-esophageal reflux disease without esophagitis: Secondary | ICD-10-CM | POA: Insufficient documentation

## 2021-02-26 DIAGNOSIS — J449 Chronic obstructive pulmonary disease, unspecified: Secondary | ICD-10-CM | POA: Insufficient documentation

## 2021-02-26 NOTE — Progress Notes (Signed)
MRN : [id number]  Todd Brown is a 60 y.o. (1961/01/17) male who presents with chief complaint of check my fistula.  History of Present Illness:    The patient is seen for evaluation of dialysis access.  The patient has a history of multiple failed accesses.  He has a left brachiocephalic fistula complicated with aneurysmal deterioration poor dialysis and deterioration of the skin risking rupture.  He also has an inflow stenosis in the radial artery 6-7 cm proximal to the anastomosis.   Current access is via a catheter (see below) which is functioning poorly.  There have been several episodes of catheter infection.  PROCEDURE 02/14/2021: Insertion of tunneled dialysis catheter right IJ approach with ultrasound and fluoroscopic guidance. Contrast injection left arm brachiocephalic fistula   PRE-OPERATIVE DIAGNOSIS:  The patient denies amaurosis fugax or recent TIA symptoms. There are no recent neurological changes noted. The patient denies claudication symptoms or rest pain symptoms. The patient denies history of DVT, PE or superficial thrombophlebitis. The patient denies recent episodes of angina or shortness of breath.    No outpatient medications have been marked as taking for the 02/27/21 encounter (Appointment) with Delana Meyer, Dolores Lory, MD.    Past Medical History:  Diagnosis Date   Anemia    Dialysis patient [facility])    Mon. 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   ESRD (end stage renal disease) ([place])    Hyperlipidemia    Malignant hypertension    Pneumonia 2016   Renal artery stenosis (HCC)    Schizophrenia (HCC)     Past Surgical History:  Procedure Laterality Date   A/V FISTULAGRAM N/A 08/06/2016   Procedure: A/V Fistulagram;  Surgeon: Algernon Huxley, MD;  Location: [place] [facility];  Service: Cardiovascular;  Laterality: N/A;   A/V FISTULAGRAM N/A 02/14/2021   Procedure: A/V FISTULAGRAM poss Perm Cath Insertion;   Surgeon: Katha Cabal, MD;  Location: [place] [facility];  Service: Cardiovascular;  Laterality: N/A;   A/V SHUNT INTERVENTION N/A 08/06/2016   Procedure: A/V Shunt Intervention;  Surgeon: Algernon Huxley, MD;  Location: [place] [facility];  Service: Cardiovascular;  Laterality: N/A;   AV FISTULA PLACEMENT Left 12/26/2014   Procedure: ARTERIOVENOUS (AV) FISTULA CREATION;  Surgeon: Algernon Huxley, MD;  Location: [facility] ORS;  Service: Vascular;  Laterality: Left;   ESOPHAGOGASTRODUODENOSCOPY (EGD) WITH PROPOFOL N/A 05/06/2017   Procedure: ESOPHAGOGASTRODUODENOSCOPY (EGD) WITH PROPOFOL;  Surgeon: Jonathon Bellows, MD;  Location: [facility] ENDOSCOPY;  Service: Gastroenterology;  Laterality: N/A;   INSERTION OF DIALYSIS CATHETER Right    PERIPHERAL VASCULAR CATHETERIZATION N/A 10/22/2014   Procedure: Dialysis/Perma Catheter Insertion;  Surgeon: Algernon Huxley, MD;  Location: [place] [facility];  Service: Cardiovascular;  Laterality: N/A;   PERIPHERAL VASCULAR CATHETERIZATION N/A 02/28/2015   Procedure: Dialysis/Perma Catheter Removal;  Surgeon: Algernon Huxley, MD;  Location: [place] [facility];  Service: Cardiovascular;  Laterality: N/A;   Repair fx left lower leg     yrs ago (age 61)   TONSILLECTOMY      Social History Social History   Tobacco Use   Smoking status: Every Day    Packs/day: 0.50    Types: Cigarettes   Smokeless tobacco: Never   Tobacco comments:    10 cigars a day  Vaping Use   Vaping Use: Never used  Substance Use Topics   Alcohol use: No   Drug use: Yes  Types: Marijuana    Comment: not since living at assisted living place 03/2020    Family History Family History  Problem Relation Age of Onset   Kidney disease Brother    Diabetes Neg Hx     Allergies  Allergen Reactions   Chlorpromazine Other (See Comments)    Reaction:  Unknown , pt states it makes him feel real bad Reaction:  Unknown , pt states it makes him feel real bad      REVIEW OF SYSTEMS (Negative  unless checked)  Constitutional: '[]'$ Weight loss  '[]'$ Fever  '[]'$ Chills Cardiac: '[]'$ Chest pain   '[]'$ Chest pressure   '[]'$ Palpitations   '[]'$ Shortness of breath when laying flat   '[]'$ Shortness of breath with exertion. Vascular:  '[]'$ Pain in legs with walking   '[]'$ Pain in legs at rest  '[]'$ History of DVT   '[]'$ Phlebitis   '[]'$ Swelling in legs   '[]'$ Varicose veins   '[]'$ Non-healing ulcers Pulmonary:   '[]'$ Uses home oxygen   '[]'$ Productive cough   '[]'$ Hemoptysis   '[]'$ Wheeze  '[]'$ COPD   '[]'$ Asthma Neurologic:  '[]'$ Dizziness   '[]'$ Seizures   '[]'$ History of stroke   '[]'$ History of TIA  '[]'$ Aphasia   '[]'$ Vissual changes   '[]'$ Weakness or numbness in arm   '[]'$ Weakness or numbness in leg Musculoskeletal:   '[]'$ Joint swelling   '[]'$ Joint pain   '[]'$ Low back pain Hematologic:  '[]'$ Easy bruising  '[]'$ Easy bleeding   '[]'$ Hypercoagulable state   '[]'$ Anemic Gastrointestinal:  '[]'$ Diarrhea   '[]'$ Vomiting  '[x]'$ Gastroesophageal reflux/heartburn   '[]'$ Difficulty swallowing. Genitourinary:  '[x]'$ Chronic kidney disease   '[]'$ Difficult urination  '[]'$ Frequent urination   '[]'$ Blood in urine Skin:  '[]'$ Rashes   '[]'$ Ulcers  Psychological:  '[]'$ History of anxiety   '[]'$  History of major depression.  Physical Examination  There were no vitals filed for this visit. There is no height or weight on file to calculate BMI. Gen: WD/WN, NAD Head: [place]/AT, No temporalis wasting.  Ear/Nose/Throat: Hearing grossly intact, nares w/o erythema or drainage Eyes: PER, EOMI, sclera nonicteric.  Neck: Supple, no gross masses or lesions.  No JVD.  Pulmonary:  Good air movement, no audible wheezing, no use of accessory muscles.  Cardiac: RRR, precordium non-hyperdynamic. Vascular:    Left arm AV access with deteriorated aneurysms, hematoma has resolved, catheter CD&I Vessel Right Left  Radial Palpable Palpable  Brachial Palpable Palpable  Gastrointestinal: soft, non-distended. No guarding/no peritoneal signs.  Musculoskeletal: M/S 5/5 throughout.  No deformity.  Neurologic: CN 2-12 intact. Pain and light touch intact in  extremities.  Symmetrical.  Speech is fluent. Motor exam as listed above. Psychiatric: Judgment intact, Mood & affect appropriate for pt's clinical situation. Dermatologic: No rashes or ulcers noted.  No changes consistent with cellulitis.   CBC Lab Results  Component Value Date   WBC 7.2 02/18/2021   HGB 8.8 (L) 02/18/2021   HCT 25.3 (L) 02/18/2021   MCV 100.8 (H) 02/18/2021   PLT 151 02/18/2021    BMET    Component Value Date/Time   NA 139 02/18/2021 1251   K 4.0 02/18/2021 1251   CL 95 (L) 02/18/2021 1251   CO2 31 02/18/2021 1251   GLUCOSE 76 02/18/2021 1251   BUN 30 (H) 02/18/2021 1251   CREATININE 6.65 (H) 02/18/2021 1251   CREATININE 3.47 (H) 03/23/2013 1501   CALCIUM 9.7 02/18/2021 1251   CALCIUM 8.1 (L) 10/22/2014 0504   GFRNONAA 9 (L) 02/18/2021 1251   GFRAA 7 (L) 10/23/2019 0405   Estimated Creatinine Clearance: 11.7 mL/min (A) (by C-G formula based on SCr of 6.65  mg/dL (H)).  COAG Lab Results  Component Value Date   INR 1.0 02/06/2021   INR 0.9 01/20/2021   INR 3.52 05/01/2017    Radiology PERIPHERAL VASCULAR CATHETERIZATION  Result Date: 02/14/2021 See surgical note for result.  DG Chest Portable 1 View  Result Date: 02/18/2021 CLINICAL DATA:  ESRD. EXAM: PORTABLE CHEST 1 VIEW COMPARISON:  Chest radiograph dated 10/23/2019 FINDINGS: Interval placement of a right IJ dialysis catheter with tip in the region of the cavoatrial junction. There is cardiomegaly with vascular congestion and mild edema. No focal consolidation, pleural effusion or pneumothorax. No acute osseous pathology. IMPRESSION: 1. Dialysis catheter with tip at the cavoatrial junction. No pneumothorax. 2. Cardiomegaly with vascular congestion and mild edema. Electronically Signed   By: Anner Crete M.D.   On: 02/18/2021 19:09   [place] LT UPPER EXTREM LTD SOFT TISSUE NON VASCULAR  Result Date: 02/18/2021 CLINICAL DATA:  Left upper extremity swelling. Concern for hematoma or abscess around  patient's AV fistula EXAM: ULTRASOUND LEFT UPPER EXTREMITY LIMITED TECHNIQUE: Ultrasound examination of the upper extremity soft tissues was performed in the area of clinical concern. COMPARISON:  None. FINDINGS: Targeted ultrasound was performed of the soft tissues of the left upper extremity at site of patient's clinical concern. There is a large heterogeneously hypoechoic collection adjacent to enlarged tortuous vascular structure, which is likely representative of patient's known AV fistula. The collection measures approximately 7.9 x 4.4 x 4.6 cm. No internal vascularity is seen within the collection. IMPRESSION: Large heterogeneous collection within the left upper extremity measuring up to 7.9 cm, located adjacent to patient's known AV fistula. Sonographic features are most suggestive of a hematoma. Electronically Signed   By: Davina Poke D.O.   On: 02/18/2021 14:31     Assessment/Plan 1. End stage renal disease ([place]) Recommend:  At this time the patient does not have appropriate extremity access for dialysis  Patient should have a revision of his left arm fistula with Artegraft.  At that time the aneurysms will be resected and the cephalic vein confluence with be stented and the arterial inflow lesion can be angioplastied.  The risks, benefits and alternative therapies were reviewed in detail with the patient.  All questions were answered.  The patient agrees to proceed with surgery.    2. Essential hypertension, benign Continue antihypertensive medications as already ordered, these medications have been reviewed and there are no changes at this time.   3. Anticoagulated on Coumadin Continue as ordered  4. Chronic obstructive pulmonary disease, unspecified COPD type ([place]) Continue pulmonary medications and aerosols as already ordered, these medications have been reviewed and there are no changes at this time.    5. Gastroesophageal reflux disease without esophagitis Continue PPI  as already ordered, this medication has been reviewed and there are no changes at this time.  Avoidence of caffeine and alcohol  Moderate elevation of the head of the bed      Hortencia Pilar, MD  02/26/2021 6:16 PM

## 2021-02-27 ENCOUNTER — Other Ambulatory Visit: Payer: Self-pay

## 2021-02-27 ENCOUNTER — Encounter (INDEPENDENT_AMBULATORY_CARE_PROVIDER_SITE_OTHER): Payer: Self-pay | Admitting: Vascular Surgery

## 2021-02-27 ENCOUNTER — Ambulatory Visit (INDEPENDENT_AMBULATORY_CARE_PROVIDER_SITE_OTHER): Payer: Medicaid Other | Admitting: Vascular Surgery

## 2021-02-27 VITALS — BP 154/77 | HR 64 | Ht 75.0 in | Wt 189.0 lb

## 2021-02-27 DIAGNOSIS — I10 Essential (primary) hypertension: Secondary | ICD-10-CM | POA: Diagnosis not present

## 2021-02-27 DIAGNOSIS — Z7901 Long term (current) use of anticoagulants: Secondary | ICD-10-CM

## 2021-02-27 DIAGNOSIS — K219 Gastro-esophageal reflux disease without esophagitis: Secondary | ICD-10-CM

## 2021-02-27 DIAGNOSIS — N186 End stage renal disease: Secondary | ICD-10-CM | POA: Diagnosis not present

## 2021-02-27 DIAGNOSIS — J449 Chronic obstructive pulmonary disease, unspecified: Secondary | ICD-10-CM

## 2021-03-01 ENCOUNTER — Encounter (INDEPENDENT_AMBULATORY_CARE_PROVIDER_SITE_OTHER): Payer: Self-pay | Admitting: Vascular Surgery

## 2021-07-07 NOTE — Progress Notes (Signed)
MRN : [id number]  Todd Brown is a 61 y.o. ([birth year]) male who presents with chief complaint of need AV access.  History of Present Illness:    The patient is seen for evaluation of dialysis access.  The patient has a history of multiple failed accesses.     Current access is via a catheter which is functioning poorly.  There have been several episodes of catheter infection.  His existing access is markedly aneurysmal with skin changes and this was the indication for placement of his catheter.  The patient denies amaurosis fugax or recent TIA symptoms. There are no recent neurological changes noted. The patient denies claudication symptoms or rest pain symptoms. The patient denies history of DVT, PE or superficial thrombophlebitis. The patient denies recent episodes of angina or shortness of breath.    No outpatient medications have been marked as taking for the 07/10/21 encounter (Appointment) with Delana Meyer, Dolores Lory, MD.    Past Medical History:  Diagnosis Date   Anemia    Dialysis patient [facility])    Mon. 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   ESRD (end stage renal disease) ([place])    Hyperlipidemia    Malignant hypertension    Pneumonia 2016   Renal artery stenosis (HCC)    Schizophrenia (HCC)     Past Surgical History:  Procedure Laterality Date   A/V FISTULAGRAM N/A 08/06/2016   Procedure: A/V Fistulagram;  Surgeon: Algernon Huxley, MD;  Location: [place] [facility];  Service: Cardiovascular;  Laterality: N/A;   A/V FISTULAGRAM N/A 02/14/2021   Procedure: A/V FISTULAGRAM poss Perm Cath Insertion;  Surgeon: Katha Cabal, MD;  Location: [place] [facility];  Service: Cardiovascular;  Laterality: N/A;   A/V SHUNT INTERVENTION N/A 08/06/2016   Procedure: A/V Shunt Intervention;  Surgeon: Algernon Huxley, MD;  Location: [place] [facility];  Service: Cardiovascular;  Laterality: N/A;   AV FISTULA PLACEMENT Left  12/26/2014   Procedure: ARTERIOVENOUS (AV) FISTULA CREATION;  Surgeon: Algernon Huxley, MD;  Location: [facility] OR[facility];  Service: Vascular;  Laterality: Left;   ESOPHAGOGASTRODUODENOSCOPY (EGD) WITH PROPOFOL N/A 05/06/2017   Procedure: ESOPHAGOGASTRODUODENOSCOPY (EGD) WITH PROPOFOL;  Surgeon: Jonathon Bellows, MD;  Location: [facility] ENDOSCOPY;  Service: Gastroenterology;  Laterality: N/A;   INSERTION OF DIALYSIS CATHETER Right    PERIPHERAL VASCULAR CATHETERIZATION N/A 10/22/2014   Procedure: Dialysis/Perma Catheter Insertion;  Surgeon: Algernon Huxley, MD;  Location: [place] [facility];  Service: Cardiovascular;  Laterality: N/A;   PERIPHERAL VASCULAR CATHETERIZATION N/A 02/28/2015   Procedure: Dialysis/Perma Catheter Removal;  Surgeon: Algernon Huxley, MD;  Location: [place] [facility];  Service: Cardiovascular;  Laterality: N/A;   Repair fx left lower leg     yrs ago (age 79)   TONSILLECTOMY      Social History Social History   Tobacco Use   Smoking status: Every Day    Packs/day: 0.50    Types: Cigarettes   Smokeless tobacco: Never   Tobacco comments:    10 cigars a day  Vaping Use   Vaping Use: Never used  Substance Use Topics   Alcohol use: No   Drug use: Yes    Types: Marijuana    Comment: not since living at assisted living place 03/2020    Family History Family History  Problem Relation Age of Onset   Kidney disease Brother    Diabetes Neg Hx  Allergies  Allergen Reactions   Chlorpromazine Other (See Comments)    Reaction:  Unknown , pt states it makes him feel real bad Reaction:  Unknown , pt states it makes him feel real bad      REVIEW OF SYSTEMS (Negative unless checked)  Constitutional: [] Weight loss  [] Fever  [] Chills Cardiac: [] Chest pain   [] Chest pressure   [] Palpitations   [] Shortness of breath when laying flat   [] Shortness of breath with exertion. Vascular:  [] Pain in legs with walking   [] Pain in legs at rest  [] History of DVT   [] Phlebitis   [] Swelling in legs    [] Varicose veins   [] Non-healing ulcers Pulmonary:   [] Uses home oxygen   [] Productive cough   [] Hemoptysis   [] Wheeze  [] COPD   [] Asthma Neurologic:  [] Dizziness   [] Seizures   [] History of stroke   [] History of TIA  [] Aphasia   [] Vissual changes   [] Weakness or numbness in arm   [] Weakness or numbness in leg Musculoskeletal:   [] Joint swelling   [] Joint pain   [] Low back pain Hematologic:  [] Easy bruising  [] Easy bleeding   [] Hypercoagulable state   [] Anemic Gastrointestinal:  [] Diarrhea   [] Vomiting  [x] Gastroesophageal reflux/heartburn   [] Difficulty swallowing. Genitourinary:  [x] Chronic kidney disease   [] Difficult urination  [] Frequent urination   [] Blood in urine Skin:  [] Rashes   [] Ulcers  Psychological:  [] History of anxiety   []  History of major depression.  Physical Examination  There were no vitals filed for this visit. There is no height or weight on file to calculate BMI. Gen: WD/WN, NAD Head: [place]/AT, No temporalis wasting.  Ear/Nose/Throat: Hearing grossly intact, nares w/o erythema or drainage Eyes: PER, EOMI, sclera nonicteric.  Neck: Supple, no gross masses or lesions.  No JVD.  Pulmonary:  Good air movement, no audible wheezing, no use of accessory muscles.  Cardiac: RRR, precordium non-hyperdynamic. Vascular:   Left arm brachiocephalic fistula with large aneurysms and deterioration of the skin.  It is markedly pulsatile with a weak thrill and a staccato bruit.  Right IJ tunnel catheter is clean dry and intact Vessel Right Left  Radial Palpable Palpable  Brachial Palpable Palpable  Gastrointestinal: soft, non-distended. No guarding/no peritoneal signs.  Musculoskeletal: M/S 5/5 throughout.  No deformity.  Neurologic: CN 2-12 intact. Pain and light touch intact in extremities.  Symmetrical.  Speech is fluent. Motor exam as listed above. Psychiatric: Judgment intact, Mood & affect appropriate for pt's clinical situation. Dermatologic: No rashes or ulcers noted.  No  changes consistent with cellulitis.   CBC Lab Results  Component Value Date   WBC 7.2 02/18/2021   HGB 8.8 (L) 02/18/2021   HCT 25.3 (L) 02/18/2021   MCV 100.8 (H) 02/18/2021   PLT 151 02/18/2021    BMET    Component Value Date/Time   NA 139 02/18/2021 1251   K 4.0 02/18/2021 1251   CL 95 (L) 02/18/2021 1251   CO2 31 02/18/2021 1251   GLUCOSE 76 02/18/2021 1251   BUN 30 (H) 02/18/2021 1251   CREATININE 6.65 (H) 02/18/2021 1251   CREATININE 3.47 (H) 03/23/2013 1501   CALCIUM 9.7 02/18/2021 1251   CALCIUM 8.1 (L) 10/22/2014 0504   GFRNONAA 9 (L) 02/18/2021 1251   GFRAA 7 (L) 10/23/2019 0405   CrCl cannot be calculated (Patient's most recent lab result is older than the maximum 21 days allowed.).  COAG Lab Results  Component Value Date   INR 1.0 02/06/2021   INR 0.9  01/20/2021   INR 3.52 05/01/2017    Radiology No results found.   Assessment/Plan 1. End stage renal disease ([place]) Recommend:  The patient is experiencing increasing problems with their dialysis access.  Patient should have a fistulagram with the intention for intervention as a preparation for revision.  Fistulogram demonstrated a high-grade stenosis at the cephalic vein subclavian confluence and this will be stented in preparation for resection of his aneurysms and revision of his arm access.  The intention for intervention is to restore appropriate flow and prevent thrombosis and possible loss of the access.  As well as improve the quality of dialysis therapy.  The risks, benefits and alternative therapies were reviewed in detail with the patient.  All questions were answered.  The patient agrees to proceed with angio/intervention.      2. Essential hypertension, benign Continue antihypertensive medications as already ordered, these medications have been reviewed and there are no changes at this time.   3. Chronic obstructive pulmonary disease, unspecified COPD type ([place]) Continue pulmonary  medications and aerosols as already ordered, these medications have been reviewed and there are no changes at this time.    4. Gastroesophageal reflux disease without esophagitis Continue PPI as already ordered, this medication has been reviewed and there are no changes at this time.  Avoidence of caffeine and alcohol  Moderate elevation of the head of the bed      Hortencia Pilar, MD  07/07/2021 12:13 PM

## 2021-07-10 ENCOUNTER — Ambulatory Visit (INDEPENDENT_AMBULATORY_CARE_PROVIDER_SITE_OTHER): Payer: Medicaid Other | Admitting: Vascular Surgery

## 2021-07-10 ENCOUNTER — Other Ambulatory Visit: Payer: Self-pay

## 2021-07-10 ENCOUNTER — Encounter (INDEPENDENT_AMBULATORY_CARE_PROVIDER_SITE_OTHER): Payer: Self-pay | Admitting: Vascular Surgery

## 2021-07-10 VITALS — BP 174/95 | HR 69 | Ht 75.0 in | Wt 190.0 lb

## 2021-07-10 DIAGNOSIS — I10 Essential (primary) hypertension: Secondary | ICD-10-CM

## 2021-07-10 DIAGNOSIS — K219 Gastro-esophageal reflux disease without esophagitis: Secondary | ICD-10-CM

## 2021-07-10 DIAGNOSIS — N186 End stage renal disease: Secondary | ICD-10-CM

## 2021-07-10 DIAGNOSIS — J449 Chronic obstructive pulmonary disease, unspecified: Secondary | ICD-10-CM | POA: Diagnosis not present

## 2021-07-16 ENCOUNTER — Encounter (INDEPENDENT_AMBULATORY_CARE_PROVIDER_SITE_OTHER): Payer: Self-pay | Admitting: Vascular Surgery

## 2021-08-04 ENCOUNTER — Telehealth (INDEPENDENT_AMBULATORY_CARE_PROVIDER_SITE_OTHER): Payer: Self-pay | Admitting: Nurse Practitioner

## 2021-08-04 NOTE — Telephone Encounter (Signed)
We have attempted to contact DSS for consent regarding his fistulogram and revision of his dialysis graft on multiple occasions.  Unfortunately we have not received a return call to obtain consent, thus delaying this procedure.  ?

## 2021-08-13 ENCOUNTER — Telehealth (INDEPENDENT_AMBULATORY_CARE_PROVIDER_SITE_OTHER): Payer: Self-pay

## 2021-08-13 NOTE — Telephone Encounter (Addendum)
I attempted to contact Queen at [facility] to schedule the patient for a left arm fistulagram with Dr. Delana Meyer. A message was left for a return call. Queen from [facility] returned the call and the patient is scheduled with Dr. Delana Meyer for a left arm fistulagram on 08/26/21 with a 10:00 am arrival time to the MM. Pre-procedure instructions will be faxed to attention Elvis Coil at [facility] facility. An appt for 08/19/21 was offered and declined. ?

## 2021-08-25 ENCOUNTER — Telehealth (INDEPENDENT_AMBULATORY_CARE_PROVIDER_SITE_OTHER): Payer: Self-pay

## 2021-08-25 NOTE — Telephone Encounter (Signed)
Patient left arm fistulagram has been rescheduled to 3:15 with arrival time 2:15pm on 08/26/21 with Dr Lucky Cowboy. Todd Brown has been made aware with new time ?

## 2021-08-26 ENCOUNTER — Encounter
Admission: RE | Disposition: A | Discharge status: Nursing Home (Includes ICF) | Payer: Self-pay | Source: Home / Self Care | Attending: Vascular Surgery

## 2021-08-26 ENCOUNTER — Ambulatory Visit
Admission: RE | Admit: 2021-08-26 | Discharge: 2021-08-26 | Disposition: A | Discharge status: Other Acute Inpatient Hospital | Payer: Medicaid Other | Attending: Vascular Surgery | Admitting: Vascular Surgery

## 2021-08-26 DIAGNOSIS — F17210 Nicotine dependence, cigarettes, uncomplicated: Secondary | ICD-10-CM | POA: Insufficient documentation

## 2021-08-26 DIAGNOSIS — E1122 Type 2 diabetes mellitus with diabetic chronic kidney disease: Secondary | ICD-10-CM | POA: Diagnosis not present

## 2021-08-26 DIAGNOSIS — Z992 Dependence on renal dialysis: Secondary | ICD-10-CM | POA: Insufficient documentation

## 2021-08-26 DIAGNOSIS — I701 Atherosclerosis of renal artery: Secondary | ICD-10-CM | POA: Insufficient documentation

## 2021-08-26 DIAGNOSIS — I120 Hypertensive chronic kidney disease with stage 5 chronic kidney disease or end stage renal disease: Secondary | ICD-10-CM | POA: Diagnosis not present

## 2021-08-26 DIAGNOSIS — Y841 Kidney dialysis as the cause of abnormal reaction of the patient, or of later complication, without mention of misadventure at the time of the procedure: Secondary | ICD-10-CM | POA: Diagnosis not present

## 2021-08-26 DIAGNOSIS — T82898A Other specified complication of vascular prosthetic devices, implants and grafts, initial encounter: Secondary | ICD-10-CM | POA: Diagnosis not present

## 2021-08-26 DIAGNOSIS — N186 End stage renal disease: Secondary | ICD-10-CM | POA: Insufficient documentation

## 2021-08-26 DIAGNOSIS — I2510 Atherosclerotic heart disease of native coronary artery without angina pectoris: Secondary | ICD-10-CM | POA: Diagnosis not present

## 2021-08-26 DIAGNOSIS — T82590A Other mechanical complication of surgically created arteriovenous fistula, initial encounter: Secondary | ICD-10-CM | POA: Diagnosis not present

## 2021-08-26 DIAGNOSIS — F209 Schizophrenia, unspecified: Secondary | ICD-10-CM | POA: Insufficient documentation

## 2021-08-26 DIAGNOSIS — T82858A Stenosis of vascular prosthetic devices, implants and grafts, initial encounter: Secondary | ICD-10-CM | POA: Diagnosis not present

## 2021-08-26 HISTORY — PX: A/V FISTULAGRAM: CATH118298

## 2021-08-26 SURGERY — A/V FISTULAGRAM
Anesthesia: Moderate Sedation | Laterality: Left

## 2021-08-26 MED ORDER — MIDAZOLAM HCL 2 MG/2ML IJ SOLN
INTRAMUSCULAR | Status: AC
  Filled 2021-08-26: qty 4

## 2021-08-26 MED ORDER — FAMOTIDINE 20 MG PO TABS: 40.0000 mg | ORAL_TABLET | Freq: Once | ORAL | Status: DC | PRN

## 2021-08-26 MED ORDER — MIDAZOLAM HCL 2 MG/2ML IJ SOLN
INTRAMUSCULAR | Status: DC | PRN
  Administered 2021-08-26: 1 mg via INTRAVENOUS

## 2021-08-26 MED ORDER — CEFAZOLIN SODIUM-DEXTROSE 1-4 GM/50ML-% IV SOLN: 1.0000 g | Freq: Once | INTRAVENOUS | Status: AC

## 2021-08-26 MED ORDER — ONDANSETRON HCL 4 MG/2ML IJ SOLN: 4.0000 mg | Freq: Four times a day (QID) | INTRAMUSCULAR | Status: DC | PRN

## 2021-08-26 MED ORDER — SODIUM CHLORIDE 0.9 % IV SOLN
INTRAVENOUS | Status: DC
  Administered 2021-08-26: 10 mL via INTRAVENOUS

## 2021-08-26 MED ORDER — HYDROMORPHONE HCL 1 MG/ML IJ SOLN: 1.0000 mg | Freq: Once | INTRAMUSCULAR | Status: DC | PRN

## 2021-08-26 MED ORDER — HEPARIN SODIUM (PORCINE) 1000 UNIT/ML IJ SOLN
INTRAMUSCULAR | Status: AC
  Filled 2021-08-26: qty 10

## 2021-08-26 MED ORDER — CEFAZOLIN SODIUM-DEXTROSE 1-4 GM/50ML-% IV SOLN
INTRAVENOUS | Status: AC
  Administered 2021-08-26: 1 g via INTRAVENOUS
  Filled 2021-08-26: qty 50

## 2021-08-26 MED ORDER — DIPHENHYDRAMINE HCL 50 MG/ML IJ SOLN: 50.0000 mg | Freq: Once | INTRAMUSCULAR | Status: DC | PRN

## 2021-08-26 MED ORDER — METHYLPREDNISOLONE SODIUM SUCC 125 MG IJ SOLR: 125.0000 mg | Freq: Once | INTRAMUSCULAR | Status: DC | PRN

## 2021-08-26 MED ORDER — FENTANYL CITRATE (PF) 100 MCG/2ML IJ SOLN
INTRAMUSCULAR | Status: DC | PRN
  Administered 2021-08-26: 50 ug via INTRAVENOUS

## 2021-08-26 MED ORDER — MIDAZOLAM HCL 2 MG/ML PO SYRP: 8.0000 mg | ORAL_SOLUTION | Freq: Once | ORAL | Status: DC | PRN

## 2021-08-26 MED ORDER — FENTANYL CITRATE (PF) 100 MCG/2ML IJ SOLN
INTRAMUSCULAR | Status: AC
  Filled 2021-08-26: qty 2

## 2021-08-26 SURGICAL SUPPLY — 7 items
CANNULA 5F STIFF (CANNULA) ×1 IMPLANT
COVER PROBE U/S 5X48 (MISCELLANEOUS) ×1 IMPLANT
DRAPE BRACHIAL (DRAPES) ×1 IMPLANT
PACK ANGIOGRAPHY (CUSTOM PROCEDURE TRAY) ×2 IMPLANT
SHEATH BRITE TIP 6FRX5.5 (SHEATH) ×1 IMPLANT
SUT MNCRL AB 4-0 PS2 18 (SUTURE) ×1 IMPLANT
SYR MEDRAD MARK 7 150ML (SYRINGE) ×1 IMPLANT

## 2021-08-26 NOTE — H&P (Signed)
?[facility] ?Admission History & Physical ? ?MRN : [id number] ? ?Todd Brown is a 61 y.o. (February 06, 1961) male who presents with chief complaint of No chief complaint on file. ?. ? ?History of Present Illness: I am asked to evaluate the patient by the dialysis center. The patient was sent here because they were unable to achieve adequate dialysis this morning. Furthermore the Center states there is very poor thrill and bruit. The patient states there there have been increasing problems with the access, such as "pulling clots" during dialysis and prolonged bleeding after decannulation. The patient estimates these problems have been going on for several weeks. The patient is unaware of any other change. ?  ?Patient denies pain or tenderness overlying the access.  There is no pain with dialysis.  The patient denies hand pain or finger pain consistent with steal syndrome.  ?  ?There have been past interventions or declots of this access.  The patient is not chronically hypotensive on dialysis. ? ?No current facility-administered medications for this encounter.  ? ? ?Past Medical History:  ?Diagnosis Date  ? Anemia   ? Dialysis patient [facility])   ? Mon. -Wed.- Fri  ? DVT (deep venous thrombosis) ([place])   ? cephalic and basolic vein thrombosis  ? ESRD (end stage renal disease) ([place])   ? Hyperlipidemia   ? Malignant hypertension   ? Pneumonia 2016  ? Renal artery stenosis ([place])   ? Schizophrenia ([place])   ? ? ?Past Surgical History:  ?Procedure Laterality Date  ? A/V FISTULAGRAM N/A 08/06/2016  ? Procedure: A/V Fistulagram;  Surgeon: Algernon Huxley, MD;  Location: [place] [facility];  Service: Cardiovascular;  Laterality: N/A;  ? A/V FISTULAGRAM N/A 02/14/2021  ? Procedure: A/V FISTULAGRAM poss Perm Cath Insertion;  Surgeon: Katha Cabal, MD;  Location: [place] [facility];  Service: Cardiovascular;  Laterality: N/A;  ? A/V SHUNT INTERVENTION N/A 08/06/2016  ? Procedure: A/V Shunt Intervention;   Surgeon: Algernon Huxley, MD;  Location: [place] [facility];  Service: Cardiovascular;  Laterality: N/A;  ? AV FISTULA PLACEMENT Left 12/26/2014  ? Procedure: ARTERIOVENOUS (AV) FISTULA CREATION;  Surgeon: Algernon Huxley, MD;  Location: [facility] ORS;  Service: Vascular;  Laterality: Left;  ? ESOPHAGOGASTRODUODENOSCOPY (EGD) WITH PROPOFOL N/A 05/06/2017  ? Procedure: ESOPHAGOGASTRODUODENOSCOPY (EGD) WITH PROPOFOL;  Surgeon: Jonathon Bellows, MD;  Location: [facility] ENDOSCOPY;  Service: Gastroenterology;  Laterality: N/A;  ? INSERTION OF DIALYSIS CATHETER Right   ? PERIPHERAL VASCULAR CATHETERIZATION N/A 10/22/2014  ? Procedure: Dialysis/Perma Catheter Insertion;  Surgeon: Algernon Huxley, MD;  Location: [place] [facility];  Service: Cardiovascular;  Laterality: N/A;  ? PERIPHERAL VASCULAR CATHETERIZATION N/A 02/28/2015  ? Procedure: Dialysis/Perma Catheter Removal;  Surgeon: Algernon Huxley, MD;  Location: [place] [facility];  Service: Cardiovascular;  Laterality: N/A;  ? Repair fx left lower leg    ? yrs ago (age 59)  ? TONSILLECTOMY    ? ? ? ?Social History  ? ?Tobacco Use  ? Smoking status: Every Day  ?  Packs/day: 0.50  ?  Types: Cigarettes  ? Smokeless tobacco: Never  ? Tobacco comments:  ?  10 cigars a day  ?Vaping Use  ? Vaping Use: Never used  ?Substance Use Topics  ? Alcohol use: No  ? Drug use: Yes  ?  Types: Marijuana  ?  Comment: not since living at assisted living place 03/2020  ? ? ? ?Family History  ?Problem Relation Age of  Onset  ? Kidney disease Brother   ? Diabetes Neg Hx   ?  No family history of bleeding or clotting disorders, autoimmune disease or porphyria ? ?Allergies  ?Allergen Reactions  ? Chlorpromazine Other (See Comments)  ?  Reaction:  Unknown , pt states it makes him feel real bad ?Reaction:  Unknown , pt states it makes him feel real bad ?  ? ? ? ?REVIEW OF SYSTEMS (Negative unless checked) ? ?Constitutional: [] Weight loss  [] Fever  [] Chills ?Cardiac: [] Chest pain   [] Chest pressure   [] Palpitations    [] Shortness of breath when laying flat   [] Shortness of breath at rest   [x] Shortness of breath with exertion. ?Vascular:  [] Pain in legs with walking   [] Pain in legs at rest   [] Pain in legs when laying flat   [] Claudication   [] Pain in feet when walking  [] Pain in feet at rest  [] Pain in feet when laying flat   [x] History of DVT   [] Phlebitis   [] Swelling in legs   [] Varicose veins   [] Non-healing ulcers ?Pulmonary:   [] Uses home oxygen   [] Productive cough   [] Hemoptysis   [] Wheeze  [] COPD   [] Asthma ?Neurologic:  [] Dizziness  [] Blackouts   [] Seizures   [] History of stroke   [] History of TIA  [] Aphasia   [] Temporary blindness   [] Dysphagia   [] Weakness or numbness in arms   [] Weakness or numbness in legs ?Musculoskeletal:  [] Arthritis   [] Joint swelling   [] Joint pain   [] Low back pain ?Hematologic:  [] Easy bruising  [] Easy bleeding   [] Hypercoagulable state   [x] Anemic  [] Hepatitis ?Gastrointestinal:  [] Blood in stool   [] Vomiting blood  [] Gastroesophageal reflux/heartburn   [] Difficulty swallowing. ?Genitourinary:  [x] Chronic kidney disease   [] Difficult urination  [] Frequent urination  [] Burning with urination   [] Blood in urine ?Skin:  [] Rashes   [] Ulcers   [] Wounds ?Psychological:  [] History of anxiety   []  History of major depression. ? ?Physical Examination ? ?There were no vitals filed for this visit. ?There is no height or weight on file to calculate BMI. ?Gen: WD/WN, NAD ?Head: [place]/AT, No temporalis wasting.  ?Ear/Nose/Throat: Hearing grossly intact, nares w/o erythema or drainage, oropharynx w/o Erythema/Exudate,  ?Eyes: Conjunctiva clear, sclera non-icteric ?Neck: Trachea midline.  No JVD.  ?Pulmonary:  Good air movement, respirations not labored, no use of accessory muscles.  ?Cardiac: RRR, normal S1, S2. ?Vascular:  ?Vessel Right Left  ?Radial Palpable Palpable  ? ?Musculoskeletal: M/S 5/5 throughout.  Extremities without ischemic changes.  No deformity or atrophy.  ?Neurologic: Sensation grossly  intact in extremities.  Symmetrical.  Speech is fluent. Motor exam as listed above. ?Psychiatric: Judgment intact, Mood & affect appropriate for pt's clinical situation. ?Dermatologic: No rashes or ulcers noted.  No cellulitis or open wounds. ? ? ? ?CBC ?Lab Results  ?Component Value Date  ? WBC 7.2 02/18/2021  ? HGB 8.8 (L) 02/18/2021  ? HCT 25.3 (L) 02/18/2021  ? MCV 100.8 (H) 02/18/2021  ? PLT 151 02/18/2021  ? ? ?BMET ?   ?Component Value Date/Time  ? NA 139 02/18/2021 1251  ? K 4.0 02/18/2021 1251  ? CL 95 (L) 02/18/2021 1251  ? CO2 31 02/18/2021 1251  ? GLUCOSE 76 02/18/2021 1251  ? BUN 30 (H) 02/18/2021 1251  ? CREATININE 6.65 (H) 02/18/2021 1251  ? CREATININE 3.47 (H) 03/23/2013 1501  ? CALCIUM 9.7 02/18/2021 1251  ? CALCIUM 8.1 (L) 10/22/2014 0504  ? GFRNONAA 9 (L) 02/18/2021 1251  ?  GFRAA 7 (L) 10/23/2019 0405  ? ?CrCl cannot be calculated (Patient's most recent lab result is older than the maximum 21 days allowed.). ? ?COAG ?Lab Results  ?Component Value Date  ? INR 1.0 02/06/2021  ? INR 0.9 01/20/2021  ? INR 3.52 05/01/2017  ? ? ?Radiology ?No results found. ? ?Assessment/Plan ?1.  Complication dialysis device:  Patient's dialysis access is malfunctioning. The patient will undergo angiography and correction of any problems using interventional techniques with the hope of restoring function to the access.  The risks and benefits were described to the patient.  All questions were answered.  The patient agrees to proceed with angiography and intervention. Potassium will be drawn to ensure that it is an appropriate level prior to performing intervention. ?2.  End-stage renal disease requiring hemodialysis:  Patient will continue dialysis therapy without further interruption if a successful intervention is not achieved then a tunneled catheter will be placed. Dialysis has already been arranged. ?3.  Hypertension:  Patient will continue medical management; nephrology is following no changes in oral  medications. ?4. Diabetes mellitus:  Glucose will be monitored and oral medications been held this morning once the patient has undergone the patient's procedure po intake will be reinitiated and again Accu-Cheks will be

## 2021-08-26 NOTE — Op Note (Signed)
[facility] ? ? ? ?OPERATIVE NOTE ? ? ?PROCEDURE: ?1.   Left brachiocephalic arteriovenous fistula cannulation under ultrasound guidance ?2.   Left arm fistulagram including central venogram ? ? ?PRE-OPERATIVE DIAGNOSIS: 1. ESRD ?2. Poorly functional aneurysmal left arm AVF ? ?POST-OPERATIVE DIAGNOSIS: same as above  ? ?SURGEON: Leotis Pain, MD ? ?ANESTHESIA: local with MCS ? ?ESTIMATED BLOOD LOSS: 2 cc ? ?FINDING(S): ?This demonstrated a markedly aneurysmal and tortuous left brachiocephalic AV fistula without any obvious focal stenosis throughout the upper arm.  The cephalic vein subclavian vein confluence had mild narrowing but not significant.  The central venous circulation was widely patent.  ? ?SPECIMEN(S):  None ? ?CONTRAST: 15 cc ? ?FLUORO TIME: 0.3 minutes ? ?MODERATE CONSCIOUS SEDATION TIME: Approximately 20 minutes with 1 mg of Versed and 50 mcg of Fentanyl  ? ?INDICATIONS: ?Todd Brown is a 61 y.o. male who presents with malfunctioning aneurysmal left arm arteriovenous fistula.  The patient is scheduled for left arm fistulagram.  The patient is aware the risks include but are not limited to: bleeding, infection, thrombosis of the cannulated access, and possible anaphylactic reaction to the contrast.  The patient is aware of the risks of the procedure and elects to proceed forward. ? ?DESCRIPTION: ?After full informed written consent was obtained, the patient was brought back to the angiography suite and placed supine upon the angiography table.  The patient was connected to monitoring equipment. Moderate conscious sedation was administered with a face to face encounter with the patient throughout the procedure with my supervision of the RN administering medicines and monitoring the patient's vital signs and mental status throughout from the start of the procedure until the patient was taken to the recovery room. The left arm was prepped and draped in the standard fashion for a  percutaneous access intervention.  Under ultrasound guidance, the left brachiocephalic arteriovenous fistula was cannulated with a micropuncture needle under direct ultrasound guidance where it was patent and a permanent image was performed.  The microwire was advanced into the fistula and the needle was exchanged for the a microsheath.  I then upsized to a 6 Fr Sheath and imaging was performed.  Hand injections were completed to image the access including the central venous system. This demonstrated a markedly aneurysmal and tortuous left brachiocephalic AV fistula without any obvious focal stenosis throughout the upper arm.  The cephalic vein subclavian vein confluence had mild narrowing but not significant.  The central venous circulation was widely patent.  Based on the images, this patient will need no endovascular intervention and only an open surgical revision if he develops skin threat.  ? ?Based on the completion imaging, no further intervention is necessary.  The wire and balloon were removed from the sheath.  A 4-0 Monocryl purse-string suture was sewn around the sheath.  The sheath was removed while tying down the suture.  A sterile bandage was applied to the puncture site. ? ?COMPLICATIONS: None ? ?CONDITION: Stable ? ? ?Leotis Pain ? ?08/26/2021 2:35 PM ? ? ?This note was created with Dragon Medical transcription system. Any errors in dictation are purely unintentional.  ?

## 2021-08-27 ENCOUNTER — Encounter: Payer: Self-pay | Admitting: Vascular Surgery

## 2021-10-07 ENCOUNTER — Emergency Department: Payer: Medicaid Other

## 2021-10-07 ENCOUNTER — Inpatient Hospital Stay
Admission: EM | Admit: 2021-10-07 | Discharge: 2021-10-16 | DRG: 304 | Disposition: A | Discharge status: Home / Self Care | Payer: Medicaid Other | Source: Skilled Nursing Facility | Attending: Internal Medicine | Admitting: Internal Medicine

## 2021-10-07 ENCOUNTER — Other Ambulatory Visit: Payer: Self-pay

## 2021-10-07 DIAGNOSIS — Z79899 Other long term (current) drug therapy: Secondary | ICD-10-CM

## 2021-10-07 DIAGNOSIS — N186 End stage renal disease: Secondary | ICD-10-CM | POA: Diagnosis present

## 2021-10-07 DIAGNOSIS — K219 Gastro-esophageal reflux disease without esophagitis: Secondary | ICD-10-CM | POA: Diagnosis present

## 2021-10-07 DIAGNOSIS — D631 Anemia in chronic kidney disease: Secondary | ICD-10-CM | POA: Diagnosis present

## 2021-10-07 DIAGNOSIS — N2581 Secondary hyperparathyroidism of renal origin: Secondary | ICD-10-CM | POA: Diagnosis present

## 2021-10-07 DIAGNOSIS — Z888 Allergy status to other drugs, medicaments and biological substances status: Secondary | ICD-10-CM

## 2021-10-07 DIAGNOSIS — Z20822 Contact with and (suspected) exposure to covid-19: Secondary | ICD-10-CM | POA: Diagnosis present

## 2021-10-07 DIAGNOSIS — I674 Hypertensive encephalopathy: Secondary | ICD-10-CM | POA: Diagnosis present

## 2021-10-07 DIAGNOSIS — I701 Atherosclerosis of renal artery: Secondary | ICD-10-CM | POA: Diagnosis present

## 2021-10-07 DIAGNOSIS — Z8701 Personal history of pneumonia (recurrent): Secondary | ICD-10-CM

## 2021-10-07 DIAGNOSIS — I120 Hypertensive chronic kidney disease with stage 5 chronic kidney disease or end stage renal disease: Secondary | ICD-10-CM | POA: Diagnosis present

## 2021-10-07 DIAGNOSIS — Z7951 Long term (current) use of inhaled steroids: Secondary | ICD-10-CM

## 2021-10-07 DIAGNOSIS — R4182 Altered mental status, unspecified: Principal | ICD-10-CM | POA: Diagnosis present

## 2021-10-07 DIAGNOSIS — Z841 Family history of disorders of kidney and ureter: Secondary | ICD-10-CM

## 2021-10-07 DIAGNOSIS — I10 Essential (primary) hypertension: Secondary | ICD-10-CM | POA: Diagnosis present

## 2021-10-07 DIAGNOSIS — I160 Hypertensive urgency: Secondary | ICD-10-CM | POA: Diagnosis not present

## 2021-10-07 DIAGNOSIS — E875 Hyperkalemia: Secondary | ICD-10-CM | POA: Diagnosis not present

## 2021-10-07 DIAGNOSIS — Z992 Dependence on renal dialysis: Secondary | ICD-10-CM | POA: Diagnosis present

## 2021-10-07 DIAGNOSIS — Z791 Long term (current) use of non-steroidal anti-inflammatories (NSAID): Secondary | ICD-10-CM

## 2021-10-07 DIAGNOSIS — E785 Hyperlipidemia, unspecified: Secondary | ICD-10-CM | POA: Diagnosis present

## 2021-10-07 DIAGNOSIS — M62838 Other muscle spasm: Secondary | ICD-10-CM | POA: Diagnosis present

## 2021-10-07 DIAGNOSIS — F17210 Nicotine dependence, cigarettes, uncomplicated: Secondary | ICD-10-CM | POA: Diagnosis present

## 2021-10-07 DIAGNOSIS — F209 Schizophrenia, unspecified: Secondary | ICD-10-CM | POA: Diagnosis present

## 2021-10-07 LAB — CBG MONITORING, ED: Glucose-Capillary: 93 mg/dL (ref 70–99)

## 2021-10-07 LAB — COMPREHENSIVE METABOLIC PANEL WITH GFR
ALT: 13 U/L (ref 0–44)
AST: 25 U/L (ref 15–41)
Albumin: 3.6 g/dL (ref 3.5–5.0)
Alkaline Phosphatase: 51 U/L (ref 38–126)
Anion gap: 12 (ref 5–15)
BUN: 31 mg/dL — ABNORMAL HIGH (ref 6–20)
CO2: 30 mmol/L (ref 22–32)
Calcium: 10 mg/dL (ref 8.9–10.3)
Chloride: 98 mmol/L (ref 98–111)
Creatinine, Ser: 5.79 mg/dL — ABNORMAL HIGH (ref 0.61–1.24)
GFR, Estimated: 10 mL/min — ABNORMAL LOW
Glucose, Bld: 86 mg/dL (ref 70–99)
Potassium: 3.8 mmol/L (ref 3.5–5.1)
Sodium: 140 mmol/L (ref 135–145)
Total Bilirubin: 0.8 mg/dL (ref 0.3–1.2)
Total Protein: 6.9 g/dL (ref 6.5–8.1)

## 2021-10-07 LAB — CBC WITH DIFFERENTIAL/PLATELET
Abs Immature Granulocytes: 0.01 10*3/uL (ref 0.00–0.07)
Basophils Absolute: 0 10*3/uL (ref 0.0–0.1)
Basophils Relative: 1 %
Eosinophils Absolute: 0.1 10*3/uL (ref 0.0–0.5)
Eosinophils Relative: 2 %
HCT: 31 % — ABNORMAL LOW (ref 39.0–52.0)
Hemoglobin: 10.4 g/dL — ABNORMAL LOW (ref 13.0–17.0)
Immature Granulocytes: 0 %
Lymphocytes Relative: 25 %
Lymphs Abs: 1.1 10*3/uL (ref 0.7–4.0)
MCH: 33 pg (ref 26.0–34.0)
MCHC: 33.5 g/dL (ref 30.0–36.0)
MCV: 98.4 fL (ref 80.0–100.0)
Monocytes Absolute: 0.5 10*3/uL (ref 0.1–1.0)
Monocytes Relative: 11 %
Neutro Abs: 2.5 10*3/uL (ref 1.7–7.7)
Neutrophils Relative %: 61 %
Platelets: 188 10*3/uL (ref 150–400)
RBC: 3.15 MIL/uL — ABNORMAL LOW (ref 4.22–5.81)
RDW: 15.4 % (ref 11.5–15.5)
WBC: 4.2 10*3/uL (ref 4.0–10.5)
nRBC: 0 % (ref 0.0–0.2)

## 2021-10-07 LAB — TROPONIN I (HIGH SENSITIVITY)
Troponin I (High Sensitivity): 29 ng/L — ABNORMAL HIGH (ref <18)
Troponin I (High Sensitivity): 25 ng/L — ABNORMAL HIGH (ref <18)

## 2021-10-07 MED ORDER — VITAMIN D 25 MCG (1000 UNIT) PO TABS
1000.0000 [IU] | ORAL_TABLET | Freq: Every day | ORAL | Status: DC
  Administered 2021-10-09 – 2021-10-16 (×8): 1000 [IU] via ORAL
  Filled 2021-10-07 (×8): qty 1

## 2021-10-07 MED ORDER — POLYETHYLENE GLYCOL 3350 17 G PO PACK
17.0000 g | PACK | Freq: Every day | ORAL | Status: DC | PRN
  Administered 2021-10-11 – 2021-10-14 (×4): 17 g via ORAL
  Filled 2021-10-07 (×4): qty 1

## 2021-10-07 MED ORDER — MOMETASONE FURO-FORMOTEROL FUM 200-5 MCG/ACT IN AERO
2.0000 | INHALATION_SPRAY | Freq: Two times a day (BID) | RESPIRATORY_TRACT | Status: DC
  Administered 2021-10-08 – 2021-10-16 (×17): 2 via RESPIRATORY_TRACT
  Filled 2021-10-07: qty 8.8

## 2021-10-07 MED ORDER — CALCIUM ACETATE (PHOS BINDER) 667 MG PO CAPS
1334.0000 mg | ORAL_CAPSULE | Freq: Three times a day (TID) | ORAL | Status: DC
  Administered 2021-10-08 – 2021-10-16 (×23): 1334 mg via ORAL
  Filled 2021-10-07 (×23): qty 2

## 2021-10-07 MED ORDER — HYDRALAZINE HCL 20 MG/ML IJ SOLN
5.0000 mg | Freq: Once | INTRAMUSCULAR | Status: AC
  Administered 2021-10-08: 5 mg via INTRAVENOUS
  Filled 2021-10-07: qty 1

## 2021-10-07 MED ORDER — HEPARIN SODIUM (PORCINE) 5000 UNIT/ML IJ SOLN
5000.0000 [IU] | Freq: Three times a day (TID) | INTRAMUSCULAR | Status: DC
  Administered 2021-10-08 – 2021-10-16 (×25): 5000 [IU] via SUBCUTANEOUS
  Filled 2021-10-07 (×26): qty 1

## 2021-10-07 MED ORDER — ACETAMINOPHEN 500 MG PO TABS
1000.0000 mg | ORAL_TABLET | Freq: Four times a day (QID) | ORAL | Status: AC | PRN
  Administered 2021-10-08: 1000 mg via ORAL
  Filled 2021-10-07: qty 2

## 2021-10-07 MED ORDER — PANTOPRAZOLE SODIUM 40 MG PO TBEC
80.0000 mg | DELAYED_RELEASE_TABLET | Freq: Every day | ORAL | Status: DC
  Administered 2021-10-09 – 2021-10-16 (×8): 80 mg via ORAL
  Filled 2021-10-07 (×8): qty 2

## 2021-10-07 MED ORDER — ACETAMINOPHEN 650 MG RE SUPP: 650.0000 mg | Freq: Four times a day (QID) | RECTAL | Status: AC | PRN

## 2021-10-07 MED ORDER — LABETALOL HCL 5 MG/ML IV SOLN: 5.0000 mg | INTRAVENOUS | Status: DC | PRN

## 2021-10-07 MED ORDER — CLONIDINE HCL 0.1 MG PO TABS
0.3000 mg | ORAL_TABLET | Freq: Three times a day (TID) | ORAL | Status: DC
  Administered 2021-10-08 – 2021-10-16 (×24): 0.3 mg via ORAL
  Filled 2021-10-07 (×24): qty 3

## 2021-10-07 MED ORDER — TRIHEXYPHENIDYL HCL 2 MG PO TABS
2.0000 mg | ORAL_TABLET | Freq: Two times a day (BID) | ORAL | Status: DC
  Administered 2021-10-08 – 2021-10-16 (×16): 2 mg via ORAL
  Filled 2021-10-07 (×17): qty 1

## 2021-10-07 MED ORDER — AMLODIPINE BESYLATE 5 MG PO TABS: 10.0000 mg | ORAL_TABLET | Freq: Every day | ORAL | Status: DC

## 2021-10-07 MED ORDER — CARBAMAZEPINE 200 MG PO TABS
1000.0000 mg | ORAL_TABLET | Freq: Every morning | ORAL | Status: DC
  Administered 2021-10-08 – 2021-10-13 (×6): 1000 mg via ORAL
  Filled 2021-10-07 (×8): qty 5

## 2021-10-07 MED ORDER — RENA-VITE PO TABS
1.0000 | ORAL_TABLET | Freq: Every day | ORAL | Status: DC
Start: 2021-10-08 — End: 2021-10-16
  Administered 2021-10-08 – 2021-10-15 (×8): 1 via ORAL
  Filled 2021-10-07 (×9): qty 1

## 2021-10-07 MED ORDER — FLUPHENAZINE HCL 5 MG PO TABS
2.5000 mg | ORAL_TABLET | Freq: Every day | ORAL | Status: DC
  Administered 2021-10-09 – 2021-10-16 (×8): 2.5 mg via ORAL
  Filled 2021-10-07 (×10): qty 1

## 2021-10-07 MED ORDER — ONDANSETRON HCL 4 MG/2ML IJ SOLN: 4.0000 mg | Freq: Four times a day (QID) | INTRAMUSCULAR | Status: DC | PRN

## 2021-10-07 MED ORDER — ALBUTEROL SULFATE (2.5 MG/3ML) 0.083% IN NEBU: 2.5000 mg | INHALATION_SOLUTION | RESPIRATORY_TRACT | Status: DC | PRN

## 2021-10-07 MED ORDER — LOSARTAN POTASSIUM 50 MG PO TABS: 50.0000 mg | ORAL_TABLET | Freq: Every day | ORAL | Status: DC

## 2021-10-07 MED ORDER — ONDANSETRON HCL 4 MG PO TABS: 4.0000 mg | ORAL_TABLET | Freq: Four times a day (QID) | ORAL | Status: DC | PRN

## 2021-10-07 MED ORDER — HYDRALAZINE HCL 50 MG PO TABS
100.0000 mg | ORAL_TABLET | Freq: Two times a day (BID) | ORAL | Status: DC
  Administered 2021-10-08: 100 mg via ORAL
  Filled 2021-10-07: qty 2

## 2021-10-07 MED ORDER — FERROUS SULFATE 325 (65 FE) MG PO TABS
325.0000 mg | ORAL_TABLET | Freq: Every day | ORAL | Status: DC
  Administered 2021-10-09 – 2021-10-16 (×8): 325 mg via ORAL
  Filled 2021-10-07 (×8): qty 1

## 2021-10-07 MED ORDER — MELATONIN 5 MG PO TABS
5.0000 mg | ORAL_TABLET | Freq: Every evening | ORAL | Status: DC | PRN
  Administered 2021-10-12 – 2021-10-14 (×3): 5 mg via ORAL
  Filled 2021-10-07 (×3): qty 1

## 2021-10-07 MED ORDER — TIZANIDINE HCL 2 MG PO TABS
2.0000 mg | ORAL_TABLET | Freq: Three times a day (TID) | ORAL | Status: DC | PRN
  Administered 2021-10-08 – 2021-10-16 (×6): 2 mg via ORAL
  Filled 2021-10-07 (×7): qty 1

## 2021-10-07 NOTE — ED Triage Notes (Signed)
pt coming from [facility], pt had dialysis yesterdat, fluid overload, HTN 200/125 right arm, end stage renal failure, is on bp meds, pt AMS at this time. pt hands and feet swollen at this time. cbg 88. Hx schizophrenia ?

## 2021-10-07 NOTE — Assessment & Plan Note (Addendum)
-   With history of garbled and pressured speech with possible cognitive impairment per psychiatry note on 10/04/2019 ?- Resumed home fluphenazine 2.5 mg daily, trihexyphenidyl 2 mg daily, carbamazepine 1000 mg q. every morning ?

## 2021-10-07 NOTE — Assessment & Plan Note (Signed)
PPI ?

## 2021-10-07 NOTE — Hospital Course (Addendum)
[name] is a 61 year old male with history of hypertension, ESRD on HD via left AV fistula, schizophrenia, GERD, muscle spasms, who presents emergency department for chief concerns of altered mental status and high blood pressure. ? ?Initial vitals in ED showed temperature of 98.1, respiration rate of 15, heart 77, initial blood pressure 123 over 96 and increased to 204/87, SPO2 of 93% on room air. ? ?Labs is remarkable for BUN of 31, serum creatinine 5.79, GFR of 10, high sensitive troponin is 29, and decreased to 25. ? ?WBC was not elevated at 4.2.  Hemoglobin 10.4, platelets of 188. ? ?CT of the head without contrast was read as no acute intracranial findings seen in the noncontrast CT of the brain. Atrophy. Small vessel disease. ? ?ED treatment: Hydralazine 5 mg IV one-time dose ordered ?

## 2021-10-07 NOTE — H&P (Addendum)
?History and Physical  ? ?Todd Brown UVO:[id number] DOB: 1960/07/19 DOA: 10/07/2021 ? ?PCP: Jodi Marble, MD  ?Outpatient Specialists: Dr. Delana Meyer, vascular surgery ?Patient coming from: home via EMS ? ?I have personally briefly reviewed patient's old medical records in [place]. ? ?Chief Concern: altered mental status; elevated blood pressure ? ?HPI: [name] is a 61 year old male with history of hypertension, ESRD on HD via left AV fistula, schizophrenia, GERD, muscle spasms, who presents emergency department for chief concerns of altered mental status and high blood pressure. ? ?Initial vitals in ED showed temperature of 98.1, respiration rate of 15, heart 77, initial blood pressure 123 over 96 and increased to 204/87, SPO2 of 93% on room air. ? ?Labs is remarkable for BUN of 31, serum creatinine 5.79, GFR of 10, high sensitive troponin is 29, and decreased to 25. ? ?WBC was not elevated at 4.2.  Hemoglobin 10.4, platelets of 188. ? ?CT of the head without contrast was read as no acute intracranial findings seen in the noncontrast CT of the brain. Atrophy. Small vessel disease. ? ?ED treatment: Hydralazine 5 mg IV one-time dose ordered ? ?At bedside patient has stuttering, articulation error and per EDP this is baseline.  He was able to tell me his name and his age though this was very difficult for me to understand.  Per nursing staff, he had a pack of cigarettes in his socks that were removed. ? ?He was not able to answer if he was hurting anywhere, alcohol use, drug use, with the specific questions he repeated it after me without answering the questions. ? ?Social history: Patient is from [facility].  He endorses tobacco use. ? ?Vaccination history: Unknown ? ?ROS: Unable to complete due to garbled and difficult to comprehend speech. ? ?ED Course: Discussed with emergency medicine provider, patient requiring hospitalization for chief concerns of hypertensive urgency  and altered mental status. ? ?Assessment/Plan ? ?Principal Problem: ?  Hypertensive urgency ?Active Problems: ?  Schizophrenia ([place]) ?  Essential hypertension, benign ?  End stage renal disease ([place]) ?  Dialysis patient [facility]) ?  GERD (gastroesophageal reflux disease) ?  Altered mental status ?  ?Assessment and Plan: ? ?* Hypertensive urgency ?- Etiology work-up in progress ?- Resumed home antihypertensive medications, amlodipine 10 mg daily, clonidine 0.3 mg p.o. 3 times daily, hydralazine 100 mg p.o. twice daily, losartan 50 mg daily ?- Ordered diltiazem 15 mg IV as needed one-time dose for SBP greater than 180 and do not administer if SBP is less than 170, 1 doses ordered, with instructions for nursing staff to administer first and then let provider know ?- If patient's blood pressure does not improve, the plan is to initiate Cardene gtt. and admit patient to progressive cardiac or stepdown ?- Admit to telemetry medical, observation ? ?Altered mental status ?- Etiology work-up in progress ?- I was not able to reach a provider at [facility] to assess patient's baseline mental status ?- Check ammonia, B12, UDS ? ?GERD (gastroesophageal reflux disease) ?- PPI ? ?End stage renal disease ([place]) ?- Routine Staff message sent to nephrology service, Dr. Theador Hawthorne and epic order placed ? ?Essential hypertension, benign ?- I have resumed home antihypertensive medications and as needed coverage ? ?Schizophrenia ([place]) ?- With history of garbled and pressured speech with possible cognitive impairment per psychiatry note on 10/04/2019 ?- Resumed home fluphenazine 2.5 mg daily, trihexyphenidyl 2 mg daily, carbamazepine 1000 mg q. every morning ? ?Chart reviewed.  ? ?  Complete echo 09/12/2018: Ejection fraction estimated at 50-55 percent, left ventricular diastolic Doppler parameters are consistent with impaired relaxation. ? ?DVT prophylaxis: Heparin 5000 units subcutaneous every 8 hours ?Code Status: full code ?Diet: Renal ?Family  Communication: Patient has a legal guardian.  I attempted to call [name], at [phone], no pickup ?Disposition Plan: Pending clinical course ?Consults called: Nephrology ?Admission status: Telemetry medical, observation ? ?Past Medical History:  ?Diagnosis Date  ? Anemia   ? Dialysis patient [facility])   ? Mon. -Wed.- Fri  ? DVT (deep venous thrombosis) ([place])   ? cephalic and basolic vein thrombosis  ? ESRD (end stage renal disease) ([place])   ? Hyperlipidemia   ? Malignant hypertension   ? Pneumonia 2016  ? Renal artery stenosis ([place])   ? Schizophrenia ([place])   ? ?Past Surgical History:  ?Procedure Laterality Date  ? A/V FISTULAGRAM N/A 08/06/2016  ? Procedure: A/V Fistulagram;  Surgeon: Algernon Huxley, MD;  Location: [place] [facility];  Service: Cardiovascular;  Laterality: N/A;  ? A/V FISTULAGRAM N/A 02/14/2021  ? Procedure: A/V FISTULAGRAM poss Perm Cath Insertion;  Surgeon: Katha Cabal, MD;  Location: [place] [facility];  Service: Cardiovascular;  Laterality: N/A;  ? A/V FISTULAGRAM Left 08/26/2021  ? Procedure: A/V Fistulagram;  Surgeon: Algernon Huxley, MD;  Location: [place] [facility];  Service: Cardiovascular;  Laterality: Left;  ? A/V SHUNT INTERVENTION N/A 08/06/2016  ? Procedure: A/V Shunt Intervention;  Surgeon: Algernon Huxley, MD;  Location: [place] [facility];  Service: Cardiovascular;  Laterality: N/A;  ? AV FISTULA PLACEMENT Left 12/26/2014  ? Procedure: ARTERIOVENOUS (AV) FISTULA CREATION;  Surgeon: Algernon Huxley, MD;  Location: [facility] ORS;  Service: Vascular;  Laterality: Left;  ? ESOPHAGOGASTRODUODENOSCOPY (EGD) WITH PROPOFOL N/A 05/06/2017  ? Procedure: ESOPHAGOGASTRODUODENOSCOPY (EGD) WITH PROPOFOL;  Surgeon: Jonathon Bellows, MD;  Location: [facility] ENDOSCOPY;  Service: Gastroenterology;  Laterality: N/A;  ? INSERTION OF DIALYSIS CATHETER Right   ? PERIPHERAL VASCULAR CATHETERIZATION N/A 10/22/2014  ? Procedure: Dialysis/Perma Catheter Insertion;  Surgeon: Algernon Huxley, MD;  Location:  [place] [facility];  Service: Cardiovascular;  Laterality: N/A;  ? PERIPHERAL VASCULAR CATHETERIZATION N/A 02/28/2015  ? Procedure: Dialysis/Perma Catheter Removal;  Surgeon: Algernon Huxley, MD;  Location: [place] [facility];  Service: Cardiovascular;  Laterality: N/A;  ? Repair fx left lower leg    ? yrs ago (age 83)  ? TONSILLECTOMY    ? ?Social History:  reports that he has been smoking cigarettes. He has been smoking an average of .5 packs per day. He has never used smokeless tobacco. He reports current drug use. Drug: Marijuana. He reports that he does not drink alcohol. ? ?Allergies  ?Allergen Reactions  ? Chlorpromazine Other (See Comments)  ?  Reaction:  Unknown , pt states it makes him feel real bad ?Reaction:  Unknown , pt states it makes him feel real bad ?  ? ?Family History  ?Problem Relation Age of Onset  ? Kidney disease Brother   ? Diabetes Neg Hx   ? ?Family history: Family history reviewed and pertinent for kidney disease.  ? ?Prior to Admission medications   ?Medication Sig Start Date End Date Taking? Authorizing Provider  ?acetaminophen (TYLENOL) 500 MG tablet Take 500-1,000 mg by mouth every 6 (six) hours as needed for mild pain or moderate pain.   Yes [provider]  ?albuterol (VENTOLIN HFA) 108 (90 Base) MCG/ACT inhaler Inhale 1-2 puffs into the lungs  every 4 (four) hours as needed for wheezing or shortness of breath. Shake well   Yes [provider]  ?amLODipine (NORVASC) 10 MG tablet Take 1 tablet (10 mg total) by mouth daily. 09/13/18  Yes Bettey Costa, MD  ?b complex-vitamin c-folic acid (NEPHRO-VITE) 0.8 MG TABS tablet Take 1 tablet by mouth at bedtime.   Yes [provider]  ?calcium acetate (PHOSLO) 667 MG capsule Take 2 capsules (1,334 mg total) by mouth 3 (three) times daily with meals. 05/19/15  Yes Fritzi Mandes, MD  ?carbamazepine (TEGRETOL) 200 MG tablet Take 1,000 mg by mouth in the morning.   Yes [provider]  ?cholecalciferol (VITAMIN D)  1000 UNITS tablet Take 1,000 Units by mouth daily.   Yes [provider]  ?cloNIDine (CATAPRES) 0.3 MG tablet Take 0.3 mg by mouth 3 (three) times daily.   Yes [provider]  ?ferrous

## 2021-10-07 NOTE — ED Notes (Signed)
Fall alarm, yellow bracelet and yellow socks on patient at this time ?

## 2021-10-07 NOTE — ED Notes (Signed)
Per [place] family and the facility LKW was 1730 today. ?

## 2021-10-07 NOTE — Assessment & Plan Note (Signed)
-   Routine Staff message sent to nephrology service, Dr. Theador Hawthorne and epic order placed ?

## 2021-10-07 NOTE — Assessment & Plan Note (Signed)
-   I have resumed home antihypertensive medications and as needed coverage ?

## 2021-10-07 NOTE — Assessment & Plan Note (Addendum)
-   Etiology work-up in progress ?- Resumed home antihypertensive medications, amlodipine 10 mg daily, clonidine 0.3 mg p.o. 3 times daily, hydralazine 100 mg p.o. twice daily, losartan 50 mg daily ?- Ordered diltiazem 15 mg IV as needed one-time dose for SBP greater than 180 and do not administer if SBP is less than 170, 1 doses ordered, with instructions for nursing staff to administer first and then let provider know ?- If patient's blood pressure does not improve, the plan is to initiate Cardene gtt. and admit patient to progressive cardiac or stepdown ?- Admit to telemetry medical, observation ?

## 2021-10-07 NOTE — ED Provider Notes (Signed)
? ?[facility] ?Provider Note ? ? ? Event Date/Time  ? First MD Initiated Contact with Patient 10/07/21 1944   ?  (approximate) ? ? ?History  ? ?Hypertension and Altered Mental Status ? ? ?HPI ? ?Todd Brown is a 61 y.o. male  who, per vascular surgery note dated 08/26/21 has history of ESRD on dialysis, schizophrenia, HLD, who presents to the emergency department today because of concern for high blood pressure and altered mental status. EMS states they were called out for hypertension however upon arrival they noticed that he was more altered than his baseline.  Apparently he is able to ambulate and converse normally.  Family was contacted by telephone states that he does have a stutter but they did notice today that he seemed more altered than normal.  The patient unfortunately cannot give any significant history. ? ? ?Physical Exam  ? ?Triage Vital Signs: ?ED Triage Vitals  ?Enc Vitals Group  ?   BP --   ?   Pulse --   ?   Resp --   ?   Temp --   ?   Temp src --   ?   SpO2 10/07/21 1940 91 %  ?   Weight 10/07/21 1941 185 lb (83.9 kg)  ?   Height 10/07/21 1941 6\' 4"  (1.93 m)  ?   Head Circumference --   ?   Peak Flow --   ?   Pain Score 10/07/21 1941 0  ? ?Most recent vital signs: ?Vitals:  ? 10/07/21 1940  ?SpO2: 91%  ? ? ?General: Awake, alert, not oriented. ?CV:  Good peripheral perfusion. Regular rate. ?Resp:  Normal effort. Lungs clear. ?Abd:  No distention.  ?Other:  AV fistula in left upper arm.  ? ? ?ED Results / Procedures / Treatments  ? ?Labs ?(all labs ordered are listed, but only abnormal results are displayed) ?Labs Reviewed  ?CBC WITH DIFFERENTIAL/PLATELET - Abnormal; Notable for the following components:  ?    Result Value  ? RBC 3.15 (*)   ? Hemoglobin 10.4 (*)   ? HCT 31.0 (*)   ? All other components within normal limits  ?COMPREHENSIVE METABOLIC PANEL - Abnormal; Notable for the following components:  ? BUN 31 (*)   ? Creatinine, Ser 5.79 (*)   ? GFR, Estimated 10 (*)    ? All other components within normal limits  ?TROPONIN I (HIGH SENSITIVITY) - Abnormal; Notable for the following components:  ? Troponin I (High Sensitivity) 29 (*)   ? All other components within normal limits  ?CBG MONITORING, ED  ? ? ? ?EKG ? ?INance Pear, attending physician, personally viewed and interpreted this EKG ? ?EKG Time: 1945 ?Rate: 72 ?Rhythm: sinus rhythm ?Axis: normal ?Intervals: qtc 486 ?QRS: q waves v1 ?ST changes: no st elevation ?Impression: abnormal ekg ? ? ?RADIOLOGY ?I independently interpreted and visualized the ct head. My interpretation:  ?Radiology interpretation:  ?IMPRESSION:  ?No acute intracranial findings are seen in noncontrast CT brain.  ?Atrophy. Small-vessel disease.  ? ? ? ?PROCEDURES: ? ?Critical Care performed: No ? ?Procedures ? ? ?MEDICATIONS ORDERED IN ED: ?Medications - No data to display ? ? ?IMPRESSION / MDM / ASSESSMENT AND PLAN / ED COURSE  ?I reviewed the triage vital signs and the nursing notes. ?             ?               ? ?Differential diagnosis  includes, but is not limited to, infection, intracranial process, anemia. ? ?Patient presents to the emergency department today because of concerns for high blood pressure as well as altered mental status.  On exam patient cannot give any significant history.  He was found to be hypertensive here.  CT head without any bleed or large mass.  Blood work without any concerning leukocytosis anemia or electrolyte abnormality.  Patient will be given IV hypertensive medication here.  Discussed with Dr. Tobie Poet with the hospitalist service who will plan on admission. ? ?FINAL CLINICAL IMPRESSION(S) / ED DIAGNOSES  ? ?Final diagnoses:  ?Altered mental status, unspecified altered mental status type  ?Hypertension, unspecified type  ? ? ? ? ? ?Note:  This document was prepared using Dragon voice recognition software and may include unintentional dictation errors. ? ?  ?Nance Pear, MD ?10/07/21 2338 ? ?

## 2021-10-08 ENCOUNTER — Other Ambulatory Visit: Payer: Self-pay

## 2021-10-08 DIAGNOSIS — F209 Schizophrenia, unspecified: Secondary | ICD-10-CM | POA: Diagnosis present

## 2021-10-08 DIAGNOSIS — R4182 Altered mental status, unspecified: Secondary | ICD-10-CM | POA: Diagnosis present

## 2021-10-08 DIAGNOSIS — E785 Hyperlipidemia, unspecified: Secondary | ICD-10-CM | POA: Diagnosis present

## 2021-10-08 DIAGNOSIS — K219 Gastro-esophageal reflux disease without esophagitis: Secondary | ICD-10-CM | POA: Diagnosis present

## 2021-10-08 DIAGNOSIS — I160 Hypertensive urgency: Secondary | ICD-10-CM | POA: Diagnosis present

## 2021-10-08 DIAGNOSIS — Z791 Long term (current) use of non-steroidal anti-inflammatories (NSAID): Secondary | ICD-10-CM | POA: Diagnosis not present

## 2021-10-08 DIAGNOSIS — I674 Hypertensive encephalopathy: Secondary | ICD-10-CM | POA: Diagnosis present

## 2021-10-08 DIAGNOSIS — E875 Hyperkalemia: Secondary | ICD-10-CM | POA: Diagnosis not present

## 2021-10-08 DIAGNOSIS — Z8701 Personal history of pneumonia (recurrent): Secondary | ICD-10-CM | POA: Diagnosis not present

## 2021-10-08 DIAGNOSIS — Z7951 Long term (current) use of inhaled steroids: Secondary | ICD-10-CM | POA: Diagnosis not present

## 2021-10-08 DIAGNOSIS — D631 Anemia in chronic kidney disease: Secondary | ICD-10-CM | POA: Diagnosis present

## 2021-10-08 DIAGNOSIS — I120 Hypertensive chronic kidney disease with stage 5 chronic kidney disease or end stage renal disease: Secondary | ICD-10-CM | POA: Diagnosis present

## 2021-10-08 DIAGNOSIS — I701 Atherosclerosis of renal artery: Secondary | ICD-10-CM | POA: Diagnosis present

## 2021-10-08 DIAGNOSIS — Z992 Dependence on renal dialysis: Secondary | ICD-10-CM | POA: Diagnosis not present

## 2021-10-08 DIAGNOSIS — M62838 Other muscle spasm: Secondary | ICD-10-CM | POA: Diagnosis present

## 2021-10-08 DIAGNOSIS — N2581 Secondary hyperparathyroidism of renal origin: Secondary | ICD-10-CM | POA: Diagnosis present

## 2021-10-08 DIAGNOSIS — F17210 Nicotine dependence, cigarettes, uncomplicated: Secondary | ICD-10-CM | POA: Diagnosis present

## 2021-10-08 DIAGNOSIS — N186 End stage renal disease: Secondary | ICD-10-CM | POA: Diagnosis present

## 2021-10-08 DIAGNOSIS — Z841 Family history of disorders of kidney and ureter: Secondary | ICD-10-CM | POA: Diagnosis not present

## 2021-10-08 DIAGNOSIS — Z79899 Other long term (current) drug therapy: Secondary | ICD-10-CM | POA: Diagnosis not present

## 2021-10-08 DIAGNOSIS — Z20822 Contact with and (suspected) exposure to covid-19: Secondary | ICD-10-CM | POA: Diagnosis present

## 2021-10-08 DIAGNOSIS — Z888 Allergy status to other drugs, medicaments and biological substances status: Secondary | ICD-10-CM | POA: Diagnosis not present

## 2021-10-08 LAB — HEPATITIS B CORE ANTIBODY, IGM: Hep B C IgM: NONREACTIVE

## 2021-10-08 LAB — VITAMIN B12: Vitamin B-12: 1809 pg/mL — ABNORMAL HIGH (ref 180–914)

## 2021-10-08 LAB — HIV ANTIBODY (ROUTINE TESTING W REFLEX): HIV Screen 4th Generation wRfx: NONREACTIVE

## 2021-10-08 LAB — AMMONIA: Ammonia: 27 umol/L (ref 9–35)

## 2021-10-08 LAB — HEPATITIS B SURFACE ANTIGEN: Hepatitis B Surface Ag: NONREACTIVE

## 2021-10-08 LAB — RESP PANEL BY RT-PCR (FLU A&B, COVID) ARPGX2
Influenza A by PCR: NEGATIVE
Influenza B by PCR: NEGATIVE
SARS Coronavirus 2 by RT PCR: NEGATIVE

## 2021-10-08 LAB — GLUCOSE, CAPILLARY
Glucose-Capillary: 141 mg/dL — ABNORMAL HIGH (ref 70–99)
Glucose-Capillary: 85 mg/dL (ref 70–99)
Glucose-Capillary: 99 mg/dL (ref 70–99)

## 2021-10-08 LAB — HEPATITIS B CORE ANTIBODY, TOTAL: Hep B Core Total Ab: NONREACTIVE

## 2021-10-08 LAB — CBG MONITORING, ED: Glucose-Capillary: 80 mg/dL (ref 70–99)

## 2021-10-08 MED ORDER — EPOETIN ALFA 10000 UNIT/ML IJ SOLN
INTRAMUSCULAR | Status: AC
Start: 2021-10-08 — End: 2021-10-08
  Filled 2021-10-08: qty 1

## 2021-10-08 MED ORDER — HEPARIN SODIUM (PORCINE) 1000 UNIT/ML DIALYSIS
1000.0000 [IU] | INTRAMUSCULAR | Status: DC | PRN
  Filled 2021-10-08: qty 1

## 2021-10-08 MED ORDER — GUAIFENESIN 100 MG/5ML PO LIQD: 5.0000 mL | ORAL | Status: DC | PRN

## 2021-10-08 MED ORDER — LIDOCAINE HCL (PF) 1 % IJ SOLN: 5.0000 mL | INTRAMUSCULAR | Status: DC | PRN

## 2021-10-08 MED ORDER — DILTIAZEM HCL 25 MG/5ML IV SOLN: 15.0000 mg | Freq: Once | INTRAVENOUS | Status: DC

## 2021-10-08 MED ORDER — EPOETIN ALFA 4000 UNIT/ML IJ SOLN
3000.0000 [IU] | INTRAMUSCULAR | Status: DC
  Filled 2021-10-08: qty 1

## 2021-10-08 MED ORDER — LOSARTAN POTASSIUM 50 MG PO TABS
100.0000 mg | ORAL_TABLET | Freq: Every day | ORAL | Status: DC
  Administered 2021-10-09 – 2021-10-16 (×8): 100 mg via ORAL
  Filled 2021-10-08 (×8): qty 2

## 2021-10-08 MED ORDER — EPOETIN ALFA 4000 UNIT/ML IJ SOLN
3000.0000 [IU] | INTRAMUSCULAR | Status: DC
  Administered 2021-10-08 – 2021-10-15 (×4): 3000 [IU] via INTRAVENOUS
  Filled 2021-10-08 (×3): qty 1

## 2021-10-08 MED ORDER — ALTEPLASE 2 MG IJ SOLR
2.0000 mg | Freq: Once | INTRAMUSCULAR | Status: DC | PRN
  Filled 2021-10-08: qty 2

## 2021-10-08 MED ORDER — TRAZODONE HCL 50 MG PO TABS
50.0000 mg | ORAL_TABLET | Freq: Every evening | ORAL | Status: DC | PRN
Start: 2021-10-08 — End: 2021-10-16

## 2021-10-08 MED ORDER — LIDOCAINE-PRILOCAINE 2.5-2.5 % EX CREA: 1.0000 "application " | TOPICAL_CREAM | CUTANEOUS | Status: DC | PRN

## 2021-10-08 MED ORDER — HEPARIN SODIUM (PORCINE) 1000 UNIT/ML DIALYSIS
20.0000 [IU]/kg | INTRAMUSCULAR | Status: DC | PRN
  Filled 2021-10-08: qty 2

## 2021-10-08 MED ORDER — CLONIDINE HCL 0.1 MG PO TABS
ORAL_TABLET | ORAL | Status: AC
  Filled 2021-10-08: qty 3

## 2021-10-08 MED ORDER — HYDRALAZINE HCL 50 MG PO TABS
100.0000 mg | ORAL_TABLET | Freq: Three times a day (TID) | ORAL | Status: DC
Start: 2021-10-08 — End: 2021-10-16
  Administered 2021-10-08 – 2021-10-16 (×21): 100 mg via ORAL
  Filled 2021-10-08 (×22): qty 2

## 2021-10-08 MED ORDER — PENTAFLUOROPROP-TETRAFLUOROETH EX AERO: 1.0000 "application " | INHALATION_SPRAY | CUTANEOUS | Status: DC | PRN

## 2021-10-08 MED ORDER — IPRATROPIUM-ALBUTEROL 0.5-2.5 (3) MG/3ML IN SOLN: 3.0000 mL | RESPIRATORY_TRACT | Status: DC | PRN

## 2021-10-08 MED ORDER — SENNOSIDES-DOCUSATE SODIUM 8.6-50 MG PO TABS
1.0000 | ORAL_TABLET | Freq: Every evening | ORAL | Status: DC | PRN
  Administered 2021-10-11 – 2021-10-13 (×3): 1 via ORAL
  Filled 2021-10-08 (×4): qty 1

## 2021-10-08 MED ORDER — EPOETIN ALFA 4000 UNIT/ML IJ SOLN
INTRAMUSCULAR | Status: AC
  Filled 2021-10-08: qty 1

## 2021-10-08 MED ORDER — HYDRALAZINE HCL 20 MG/ML IJ SOLN
10.0000 mg | INTRAMUSCULAR | Status: DC | PRN
  Administered 2021-10-08 – 2021-10-16 (×6): 10 mg via INTRAVENOUS
  Filled 2021-10-08 (×6): qty 1

## 2021-10-08 MED ORDER — SODIUM CHLORIDE 0.9 % IV SOLN: 100.0000 mL | INTRAVENOUS | Status: DC | PRN

## 2021-10-08 MED ORDER — DILTIAZEM HCL 25 MG/5ML IV SOLN
15.0000 mg | Freq: Once | INTRAVENOUS | Status: AC | PRN
  Administered 2021-10-08: 15 mg via INTRAVENOUS
  Filled 2021-10-08: qty 5

## 2021-10-08 MED ORDER — CHLORHEXIDINE GLUCONATE CLOTH 2 % EX PADS
6.0000 | MEDICATED_PAD | Freq: Every day | CUTANEOUS | Status: DC
  Administered 2021-10-09 – 2021-10-16 (×8): 6 via TOPICAL
  Filled 2021-10-08: qty 6

## 2021-10-08 MED ORDER — METOPROLOL TARTRATE 5 MG/5ML IV SOLN: 5.0000 mg | INTRAVENOUS | Status: DC | PRN

## 2021-10-08 MED ORDER — NICARDIPINE HCL IN NACL 20-0.86 MG/200ML-% IV SOLN
3.0000 mg/h | INTRAVENOUS | Status: DC
  Administered 2021-10-08: 3 mg/h via INTRAVENOUS
  Filled 2021-10-08: qty 200

## 2021-10-08 NOTE — Progress Notes (Signed)
[place] ? ?MRN: [id number] ? ?DOB/AGE: 01/29/1961 61 y.o. ? ?Primary Care Physician:Tejan-Sie, Brandt Loosen, MD ? ?Admit date: 10/07/2021 ? ?Chief Complaint:  ?Chief Complaint  ?Patient presents with  ? Hypertension  ? Altered Mental Status  ? ? ?S-Pt presented on  10/07/2021 with  ?Chief Complaint  ?Patient presents with  ? Hypertension  ? Altered Mental Status  ?. ?Mr. Captain Blucher is a 61 year old male with history of hypertension, ESRD on HD via left AV fistula, schizophrenia, GERD, muscle spasms, who presents emergency department for chief concerns of altered mental status and high blood pressure. ? ?Nephrology was consulted for comanagement of dialysis patient ?Patient was seen in the ER ?Patient SBP was near 200 mmHg ?Patient was confused ?Patient was unable to give much history ? ?Patient history has been taken from medical records and patient's team ? ? ?Medication  ? calcium acetate  1,334 mg Oral TID WC  ? carbamazepine  1,000 mg Oral q AM  ? cholecalciferol  1,000 Units Oral Daily  ? cloNIDine  0.3 mg Oral TID  ? ferrous sulfate  325 mg Oral Daily  ? fluPHENAZine  2.5 mg Oral Daily  ? heparin  5,000 Units Subcutaneous Q8H  ? hydrALAZINE  100 mg Oral BID  ? losartan  50 mg Oral Daily  ? mometasone-formoterol  2 puff Inhalation BID  ? multivitamin  1 tablet Oral QHS  ? pantoprazole  80 mg Oral Daily  ? trihexyphenidyl  2 mg Oral BID  ? ? ? ? ? ? ? ?ROS: Unable to get any data ? ? ?Physical Exam: ?Vital signs in last 24 hours: ?Temp:  [98.1 ?F (36.7 ?C)] 98.1 ?F (36.7 ?C) (05/09 2300) ?Pulse Rate:  [56-77] 56 (05/10 0408) ?Resp:  [15-18] 16 (05/10 0730) ?BP: (123-224)/(79-97) 197/88 (05/10 0730) ?SpO2:  [91 %-97 %] 93 % (05/10 0730) ?Weight:  [83.9 kg] 83.9 kg (05/09 1941) ?Weight change:  ?  ? ?Intake/Output from previous day: ?No intake/output data recorded. ?No intake/output data recorded. ? ? ?Physical Exam: ? ?General- pt is alert, but not oriented to time place and person ? ?HEENT-head is atraumatic  normocephalic ? ?Resp- No acute REsp distress, CTA B/L NO Rhonchi ? ?CVS- S1S2 regular in rate and rhythm ? ?GIT- BS+, soft, Non tender , Non distended ? ?EXT- No LE Edema,  No Cyanosis ? ?Access- LUE  AVF-aneurysm present ? ?Lab Results: ? ?CBC ? ?Recent Labs  ?  10/07/21 ?1948  ?WBC 4.2  ?HGB 10.4*  ?HCT 31.0*  ?PLT 188  ? ? ?BMET ? ?Recent Labs  ?  10/07/21 ?1948  ?NA 140  ?K 3.8  ?CL 98  ?CO2 30  ?GLUCOSE 86  ?BUN 31*  ?CREATININE 5.79*  ?CALCIUM 10.0  ? ? ? ? ?Most recent Creatinine trend  ?Lab Results  ?Component Value Date  ? CREATININE 5.79 (H) 10/07/2021  ? CREATININE 6.65 (H) 02/18/2021  ? CREATININE 4.20 (H) 02/14/2021  ?  ? ? ?MICRO ? ? ?Recent Results (from the past 240 hour(s))  ?Resp Panel by RT-PCR (Flu A&B, Covid) Nasopharyngeal Swab     Status: None  ? Collection Time: 10/07/21  7:48 PM  ? Specimen: Nasopharyngeal Swab; Nasopharyngeal(NP) swabs in vial transport medium  ?Result Value Ref Range Status  ? SARS Coronavirus 2 by RT PCR NEGATIVE NEGATIVE Final  ?  Comment: (NOTE) ?SARS-CoV-2 target nucleic acids are NOT DETECTED. ? ?The SARS-CoV-2 RNA is generally detectable in upper respiratory ?specimens during the acute phase of  infection. The lowest ?concentration of SARS-CoV-2 viral copies this assay can detect is ?138 copies/mL. A negative result does not preclude SARS-Cov-2 ?infection and should not be used as the sole basis for treatment or ?other patient management decisions. A negative result may occur with  ?improper specimen collection/handling, submission of specimen other ?than nasopharyngeal swab, presence of viral mutation(s) within the ?areas targeted by this assay, and inadequate number of viral ?copies(<138 copies/mL). A negative result must be combined with ?clinical observations, patient history, and epidemiological ?information. The expected result is Negative. ? ?Fact Sheet for Patients:  ?EntrepreneurPulse.com.au ? ?Fact Sheet for Healthcare Providers:   ?IncredibleEmployment.be ? ?This test is no t yet approved or cleared by the [place] FDA and  ?has been authorized for detection and/or diagnosis of SARS-CoV-2 by ?FDA under an Emergency Use Authorization (EUA). This EUA will remain  ?in effect (meaning this test can be used) for the duration of the ?COVID-19 declaration under Section 564(b)(1) of the Act, 21 ?U.S.C.section 360bbb-3(b)(1), unless the authorization is terminated  ?or revoked sooner.  ? ? ?  ? Influenza A by PCR NEGATIVE NEGATIVE Final  ? Influenza B by PCR NEGATIVE NEGATIVE Final  ?  Comment: (NOTE) ?The Xpert Xpress SARS-CoV-2/FLU/RSV plus assay is intended as an aid ?in the diagnosis of influenza from Nasopharyngeal swab specimens and ?should not be used as a sole basis for treatment. Nasal washings and ?aspirates are unacceptable for Xpert Xpress SARS-CoV-2/FLU/RSV ?testing. ? ?Fact Sheet for Patients: ?EntrepreneurPulse.com.au ? ?Fact Sheet for Healthcare Providers: ?IncredibleEmployment.be ? ?This test is not yet approved or cleared by the [place] FDA and ?has been authorized for detection and/or diagnosis of SARS-CoV-2 by ?FDA under an Emergency Use Authorization (EUA). This EUA will remain ?in effect (meaning this test can be used) for the duration of the ?COVID-19 declaration under Section 564(b)(1) of the Act, 21 U.S.C. ?section 360bbb-3(b)(1), unless the authorization is terminated or ?revoked. ? ?Performed at [facility], [place], ?[place] [address] ?  ?  ? ? ? ? ? ?Impression: ? ? ?1)Renal   ? ?ESRD on hemodialysis ?Patient is on Monday Wednesday Friday schedule. ?As an outpatient patient is under care for [facility] and get dialyzed at [facility] on [address] on Monday Wednesday Friday schedule. ? ?We were able to contact social services to get the consent for dialysis ?We will dialyze patient today ? ? ?2) hypertensive urgency ? ?We will increase  patient's hydralazine dose from 100 mg p.o. twice daily to 100 mg p.o. 3 times daily ?We will increase patient's losartan dose from 50 to -100 ? ?Fluid removal/dialysis should help ? ? ? ? ?3)Anemia of chronic disease ? ? ?  Latest Ref Rng & Units 10/07/2021  ?  7:48 PM 02/18/2021  ? 12:51 PM 02/14/2021  ?  8:58 AM  ?CBC  ?WBC 4.0 - 10.5 K/uL 4.2   7.2     ?Hemoglobin 13.0 - 17.0 g/dL 10.4   8.8   9.5    ?Hematocrit 39.0 - 52.0 % 31.0   25.3   28.0    ?Platelets 150 - 400 K/uL 188   151     ?  ? ? ? HGb at goal (9--11) ? ? ?4) Secondary hyperparathyroidism -CKD Mineral-Bone Disorder ? ? ? ?Lab Results  ?Component Value Date  ? PTH 194 (H) 05/11/2017  ? CALCIUM 10.0 10/07/2021  ? CAION 1.17 02/14/2021  ? PHOS 3.2 10/20/2019  ? ? ?Secondary Hyperparathyroidism present ? ?Phosphorus  at goal. ? ? ?5) altered mental status ?Patient being followed by the primary team ? ?6) Electrolytes  ? ? ?  Latest Ref Rng & Units 10/07/2021  ?  7:48 PM 02/18/2021  ? 12:51 PM 02/14/2021  ?  8:58 AM  ?BMP  ?Glucose 70 - 99 mg/dL 86   76   87    ?BUN 6 - 20 mg/dL 31   30   18     ?Creatinine 0.61 - 1.24 mg/dL 5.79   6.65   4.20    ?Sodium 135 - 145 mmol/L 140   139   137    ?Potassium 3.5 - 5.1 mmol/L 3.8   4.0   3.4    ?Chloride 98 - 111 mmol/L 98   95   96    ?CO2 22 - 32 mmol/L 30   31     ?Calcium 8.9 - 10.3 mg/dL 10.0   9.7     ?  ? ?Sodium ?Normonatremic ? ? ?Potassium ?Normokalemic ? ? ? ?7)Acid base ? ? ? ?Co2 at goal ? ? ? ? ?Plan: ? ?We will dialyze patient today ?We will try for fluid removal ?We will request to start patient back on his outpatient medications ?We will keep patient on Epogen ? ? ?Addendum ?Patient was later seen on dialysis ?Patient tolerated treatment well ? ? ? ? ?Tymeer Vaquera s Theador Hawthorne ?10/08/2021, 7:56 AM  ?

## 2021-10-08 NOTE — ED Notes (Signed)
Pt was returned from dialysis due to no consent.  ?

## 2021-10-08 NOTE — ED Notes (Signed)
Informed RN bed assigned 

## 2021-10-08 NOTE — ED Notes (Signed)
Cardene gtt held for dialysis  ?

## 2021-10-08 NOTE — Assessment & Plan Note (Signed)
-   Etiology work-up in progress ?- I was not able to reach a provider at [facility] to assess patient's baseline mental status ?- Check ammonia, B12, UDS ?

## 2021-10-08 NOTE — Progress Notes (Signed)
?PROGRESS NOTE ? ? ? ?Todd Brown  GEX:[id number] DOB: 21-Dec-1960 DOA: 10/07/2021 ?PCP: Jodi Marble, MD  ? ?Brief Narrative:  ?61 year old with history of HTN, ESRD on HD via left AV fistula, schizophrenia, GERD, muscle spasm came to the ED with complaints of altered mental status and high blood pressure.  Initially patient's systolic blood pressure was greater than 200.  CT of the head was negative.  He was diagnosed with hypertensive urgency started on home medication and briefly on Cardene drip.  Nephrology was consulted for hemodialysis. ? ? ?Assessment & Plan: ? Principal Problem: ?  Hypertensive urgency ?Active Problems: ?  Schizophrenia ([place]) ?  Essential hypertension, benign ?  End stage renal disease ([place]) ?  Dialysis patient [facility]) ?  GERD (gastroesophageal reflux disease) ?  Altered mental status ?  ? ? ?Assessment and Plan: ?* Hypertensive urgency ?-BP still elevated.  Mentation appears to be more or less at baseline now.  Resume home amlodipine, clonidine, hydralazine and losartan.  Cardene drip if necessary.  Supportive care. ? ?Altered mental status, appears to be at baseline ?- Patient resides at [facility].  Ammonia, normal B12, elevated ? ?GERD (gastroesophageal reflux disease) ?- PPI ? ?End stage renal disease ([place]) ?- Nephrology consulted for hemodialysis ? ?Essential hypertension, benign ?- I have resumed home antihypertensive medications and as needed coverage ? ?Schizophrenia ([place]) ?- With history of garbled and pressured speech with possible cognitive impairment per psychiatry note on 10/04/2019 ?- Resumed home fluphenazine 2.5 mg daily, trihexyphenidyl 2 mg daily, carbamazepine 1000 mg q. every morning ? ? ? ?DVT prophylaxis: Subcu heparin ?Code Status: Full code ?Family Communication:   ? ?Status is: Inpatient ?Remains inpatient appropriate because: Maintain hospital stay until blood pressure and mentation improves. ? ? ?Nutritional status ? ? ? ?  ? ?  ? ?Body mass index is 22.52  kg/m?. ? ?  ? ? ? ? ? ?Subjective: ?Patient seen in hemodialysis unit.  Patient is still little confused but more or less appears to be at baseline.  No complaints.  During my visit his blood pressure was systolic 010.  Advised nursing staff to give him clonidine. ? ? ? ?Examination: ? ?Constitutional: Alert, not in acute distress.  Has garbled speech, difficult to understand but appears to be around baseline ?Respiratory: Clear to auscultation bilaterally ?Cardiovascular: Normal sinus rhythm, no rubs ?Abdomen: Nontender nondistended good bowel sounds ?Musculoskeletal: No edema noted ?Skin: No rashes seen ?Neurologic: Grossly moving all the extremities.  Alert to name and place only ?Psychiatric: Poor judgment and insight ? ? ?Objective: ?Vitals:  ? 10/08/21 1100 10/08/21 1115 10/08/21 1130 10/08/21 1145  ?BP: (!) 207/90 (!) 199/83 (!) 196/89   ?Pulse: (!) 53 (!) 53 (!) 51 (!) 57  ?Resp: 10 (!) 8 12 12   ?Temp:      ?TempSrc:      ?SpO2:      ?Weight:      ?Height:      ? ?No intake or output data in the 24 hours ending 10/08/21 1148 ?Filed Weights  ? 10/07/21 1941  ?Weight: 83.9 kg  ? ? ? ?Data Reviewed:  ? ?CBC: ?Recent Labs  ?Lab 10/07/21 ?1948  ?WBC 4.2  ?NEUTROABS 2.5  ?HGB 10.4*  ?HCT 31.0*  ?MCV 98.4  ?PLT 188  ? ?Basic Metabolic Panel: ?Recent Labs  ?Lab 10/07/21 ?1948  ?NA 140  ?K 3.8  ?CL 98  ?CO2 30  ?GLUCOSE 86  ?BUN 31*  ?CREATININE 5.79*  ?CALCIUM 10.0  ? ?  GFR: ?Estimated Creatinine Clearance: 16.1 mL/min (A) (by C-G formula based on SCr of 5.79 mg/dL (H)). ?Liver Function Tests: ?Recent Labs  ?Lab 10/07/21 ?1948  ?AST 25  ?ALT 13  ?ALKPHOS 51  ?BILITOT 0.8  ?PROT 6.9  ?ALBUMIN 3.6  ? ?No results for input(s): LIPASE, AMYLASE in the last 168 hours. ?Recent Labs  ?Lab 10/08/21 ?0023  ?AMMONIA 27  ? ?Coagulation Profile: ?No results for input(s): INR, PROTIME in the last 168 hours. ?Cardiac Enzymes: ?No results for input(s): CKTOTAL, CKMB, CKMBINDEX, TROPONINI in the last 168 hours. ?BNP (last 3  results) ?No results for input(s): PROBNP in the last 8760 hours. ?HbA1C: ?No results for input(s): HGBA1C in the last 72 hours. ?CBG: ?Recent Labs  ?Lab 10/07/21 ?1949 10/08/21 ?0405  ?GLUCAP 93 80  ? ?Lipid Profile: ?No results for input(s): CHOL, HDL, LDLCALC, TRIG, CHOLHDL, LDLDIRECT in the last 72 hours. ?Thyroid Function Tests: ?No results for input(s): TSH, T4TOTAL, FREET4, T3FREE, THYROIDAB in the last 72 hours. ?Anemia Panel: ?Recent Labs  ?  10/07/21 ?1948  ?[id number] 1,809*  ? ?Sepsis Labs: ?No results for input(s): PROCALCITON, LATICACIDVEN in the last 168 hours. ? ?Recent Results (from the past 240 hour(s))  ?Resp Panel by RT-PCR (Flu A&B, Covid) Nasopharyngeal Swab     Status: None  ? Collection Time: 10/07/21  7:48 PM  ? Specimen: Nasopharyngeal Swab; Nasopharyngeal(NP) swabs in vial transport medium  ?Result Value Ref Range Status  ? SARS Coronavirus 2 by RT PCR NEGATIVE NEGATIVE Final  ?  Comment: (NOTE) ?SARS-CoV-2 target nucleic acids are NOT DETECTED. ? ?The SARS-CoV-2 RNA is generally detectable in upper respiratory ?specimens during the acute phase of infection. The lowest ?concentration of SARS-CoV-2 viral copies this assay can detect is ?138 copies/mL. A negative result does not preclude SARS-Cov-2 ?infection and should not be used as the sole basis for treatment or ?other patient management decisions. A negative result may occur with  ?improper specimen collection/handling, submission of specimen other ?than nasopharyngeal swab, presence of viral mutation(s) within the ?areas targeted by this assay, and inadequate number of viral ?copies(<138 copies/mL). A negative result must be combined with ?clinical observations, patient history, and epidemiological ?information. The expected result is Negative. ? ?Fact Sheet for Patients:  ?EntrepreneurPulse.com.au ? ?Fact Sheet for Healthcare Providers:  ?IncredibleEmployment.be ? ?This test is no t yet approved or  cleared by the [place] FDA and  ?has been authorized for detection and/or diagnosis of SARS-CoV-2 by ?FDA under an Emergency Use Authorization (EUA). This EUA will remain  ?in effect (meaning this test can be used) for the duration of the ?COVID-19 declaration under Section 564(b)(1) of the Act, 21 ?U.S.C.section 360bbb-3(b)(1), unless the authorization is terminated  ?or revoked sooner.  ? ? ?  ? Influenza A by PCR NEGATIVE NEGATIVE Final  ? Influenza B by PCR NEGATIVE NEGATIVE Final  ?  Comment: (NOTE) ?The Xpert Xpress SARS-CoV-2/FLU/RSV plus assay is intended as an aid ?in the diagnosis of influenza from Nasopharyngeal swab specimens and ?should not be used as a sole basis for treatment. Nasal washings and ?aspirates are unacceptable for Xpert Xpress SARS-CoV-2/FLU/RSV ?testing. ? ?Fact Sheet for Patients: ?EntrepreneurPulse.com.au ? ?Fact Sheet for Healthcare Providers: ?IncredibleEmployment.be ? ?This test is not yet approved or cleared by the [place] FDA and ?has been authorized for detection and/or diagnosis of SARS-CoV-2 by ?FDA under an Emergency Use Authorization (EUA). This EUA will remain ?in effect (meaning this test can be used) for the duration of the ?COVID-19 declaration  under Section 564(b)(1) of the Act, 21 U.S.C. ?section 360bbb-3(b)(1), unless the authorization is terminated or ?revoked. ? ?Performed at [facility], [place], ?[place] [address] ?  ?  ? ? ? ? ? ?Radiology Studies: ?CT Head Wo Contrast ? ?Result Date: 10/07/2021 ?CLINICAL DATA:  Altered mental status EXAM: CT HEAD WITHOUT CONTRAST TECHNIQUE: Contiguous axial images were obtained from the base of the skull through the vertex without intravenous contrast. RADIATION DOSE REDUCTION: This exam was performed according to the departmental dose-optimization program which includes automated exposure control, adjustment of the mA and/or kV according to patient size  and/or use of iterative reconstruction technique. COMPARISON:  10/09/2019 FINDINGS: Brain: No acute intracranial findings are seen in the noncontrast CT brain. Cortical sulci are prominent. There is decreased densit

## 2021-10-08 NOTE — Progress Notes (Signed)
? ?      CROSS COVER NOTE ? ?NAME: Jarad Barth Ybarbo ?MRN: [id number] ?DOB : 05-Aug-1960 ? ?Secure chat received from nursing reporting continued hypertension post IV diltiazem administration. BP 208/81. Nicardipine drip ordered. Patient upgraded to Progressive.  ? ?Neomia Glass MHA, MSN, FNP-BC ?Nurse Practitioner ?[facility] ?[place] ?Pager [phone] ? ? ?

## 2021-10-08 NOTE — ED Notes (Signed)
NP F[place] notified of this pt's persistent elevated BP 208/81 , made aware pt is due for HD today . Ordered to start cardene drip to keep SBP<180.   ?

## 2021-10-08 NOTE — Progress Notes (Signed)
Patient arrived to unit confused and drowsy. Unable to say his name or where he is. Was able to sit up and have assistance with eating. In and out of sleep this evening. BP still elevated, MD Amin paged and new order for PRN hydralazine.  ?

## 2021-10-08 NOTE — ED Notes (Signed)
Dialysis called and advised they needed consent. Pt is altered and cannot consent. ?

## 2021-10-09 DIAGNOSIS — I160 Hypertensive urgency: Secondary | ICD-10-CM | POA: Diagnosis not present

## 2021-10-09 LAB — GLUCOSE, CAPILLARY
Glucose-Capillary: 112 mg/dL — ABNORMAL HIGH (ref 70–99)
Glucose-Capillary: 137 mg/dL — ABNORMAL HIGH (ref 70–99)
Glucose-Capillary: 73 mg/dL (ref 70–99)
Glucose-Capillary: 88 mg/dL (ref 70–99)
Glucose-Capillary: 92 mg/dL (ref 70–99)
Glucose-Capillary: 94 mg/dL (ref 70–99)
Glucose-Capillary: 94 mg/dL (ref 70–99)
Glucose-Capillary: 97 mg/dL (ref 70–99)

## 2021-10-09 LAB — BASIC METABOLIC PANEL
Anion gap: 8 (ref 5–15)
BUN: 24 mg/dL — ABNORMAL HIGH (ref 6–20)
CO2: 30 mmol/L (ref 22–32)
Calcium: 9.3 mg/dL (ref 8.9–10.3)
Chloride: 96 mmol/L — ABNORMAL LOW (ref 98–111)
Creatinine, Ser: 5.09 mg/dL — ABNORMAL HIGH (ref 0.61–1.24)
GFR, Estimated: 12 mL/min — ABNORMAL LOW (ref >60)
Glucose, Bld: 139 mg/dL — ABNORMAL HIGH (ref 70–99)
Potassium: 3.8 mmol/L (ref 3.5–5.1)
Sodium: 134 mmol/L — ABNORMAL LOW (ref 135–145)

## 2021-10-09 LAB — MAGNESIUM: Magnesium: 1.9 mg/dL (ref 1.7–2.4)

## 2021-10-09 MED ORDER — AMLODIPINE BESYLATE 5 MG PO TABS
5.0000 mg | ORAL_TABLET | Freq: Every day | ORAL | Status: DC
Start: 2021-10-09 — End: 2021-10-09

## 2021-10-09 MED ORDER — AMLODIPINE BESYLATE 10 MG PO TABS
10.0000 mg | ORAL_TABLET | Freq: Every day | ORAL | Status: DC
  Administered 2021-10-09 – 2021-10-10 (×2): 10 mg via ORAL
  Filled 2021-10-09 (×2): qty 1

## 2021-10-09 NOTE — Plan of Care (Signed)
  Problem: Safety: Goal: Ability to remain free from injury will improve Outcome: Progressing   Problem: Skin Integrity: Goal: Risk for impaired skin integrity will decrease Outcome: Progressing   

## 2021-10-09 NOTE — Progress Notes (Signed)
?PROGRESS NOTE ? ? ? ?Todd Brown  CBJ:[id number] DOB: 17-Oct-1960 DOA: 10/07/2021 ?PCP: Jodi Marble, MD  ? ?Brief Narrative:  ?60 year old with history of HTN, ESRD on HD via left AV fistula, schizophrenia, GERD, muscle spasm came to the ED with complaints of altered mental status and high blood pressure.  Initially patient's systolic blood pressure was greater than 200.  CT of the head was negative.  He was diagnosed with hypertensive urgency started on home medication and briefly on Cardene drip.  Nephrology was consulted for hemodialysis. ? ? ?Assessment & Plan: ? Principal Problem: ?  Hypertensive urgency ?Active Problems: ?  Schizophrenia ([place]) ?  Essential hypertension, benign ?  End stage renal disease ([place]) ?  Dialysis patient [facility]) ?  GERD (gastroesophageal reflux disease) ?  Altered mental status ?  ? ? ?Assessment and Plan: ?* Hypertensive urgency ?- BP still remains elevated.  Losartan increased to 100 mg daily, hydralazine changed to 100 mg 3 times daily.  Norvasc 10 mg daily.  For now has not required Cardene drip ? ?Altered mental status, appears to be at baseline ?- [facility]  Ammonia, normal B12, elevated ? ?GERD (gastroesophageal reflux disease) ?- PPI ? ?End stage renal disease ([place]) ?- Nephrology consulted for hemodialysis ? ?Essential hypertension, benign ?- I have resumed home antihypertensive medications and as needed coverage ? ?Schizophrenia ([place]) ?- With history of garbled and pressured speech with possible cognitive impairment per psychiatry note on 10/04/2019 ?- Resumed home fluphenazine 2.5 mg daily, trihexyphenidyl 2 mg daily, carbamazepine 1000 mg q. every morning ? ? ? ?DVT prophylaxis: Subcu heparin ?Code Status: Full code ?Family Communication:  Technical brewer, legal guardian. No answer. Left a voicemail.  ? ?Status is: Inpatient ?Remains inpatient appropriate because: Maintain hospital stay until blood pressure and mentation  improves. ? ? ?Subjective: ?Patient does not any complaints at this time.  Blood pressure still remains elevated.  He does have garbled speech which apparently is his baseline ? ?Examination: ?Constitutional: Not in acute distress.  Appears chronically ill ?Respiratory: Clear to auscultation bilaterally ?Cardiovascular: Normal sinus rhythm, no rubs ?Abdomen: Nontender nondistended good bowel sounds ?Musculoskeletal: No edema noted ?Skin: No rashes seen ?Neurologic: No focal neurodeficits, difficult to assess.  Garbled speech. ?Psychiatric: Difficult to assess ?Objective: ?Vitals:  ? 10/09/21 0319 10/09/21 0428 10/09/21 0505 10/09/21 0746  ?BP: (!) 191/90 (!) 186/93 (!) 177/70 (!) 162/96  ?Pulse: (!) 53 61 62 66  ?Resp: 16   17  ?Temp: 98 ?F (36.7 ?C)   98.1 ?F (36.7 ?C)  ?TempSrc: Oral     ?SpO2: 97%   96%  ?Weight:      ?Height:      ? ? ?Intake/Output Summary (Last 24 hours) at 10/09/2021 1607 ?Last data filed at 10/08/2021 2100 ?Gross per 24 hour  ?Intake 325.62 ml  ?Output 1000 ml  ?Net -674.38 ml  ? ?Filed Weights  ? 10/07/21 1941  ?Weight: 83.9 kg  ? ? ? ?Data Reviewed:  ? ?CBC: ?Recent Labs  ?Lab 10/07/21 ?1948  ?WBC 4.2  ?NEUTROABS 2.5  ?HGB 10.4*  ?HCT 31.0*  ?MCV 98.4  ?PLT 188  ? ?Basic Metabolic Panel: ?Recent Labs  ?Lab 10/07/21 ?1948 10/09/21 ?3710  ?NA 140 134*  ?K 3.8 3.8  ?CL 98 96*  ?CO2 30 30  ?GLUCOSE 86 139*  ?BUN 31* 24*  ?CREATININE 5.79* 5.09*  ?CALCIUM 10.0 9.3  ?MG  --  1.9  ? ?GFR: ?Estimated Creatinine Clearance: 18.3 mL/min (  A) (by C-G formula based on SCr of 5.09 mg/dL (H)). ?Liver Function Tests: ?Recent Labs  ?Lab 10/07/21 ?1948  ?AST 25  ?ALT 13  ?ALKPHOS 51  ?BILITOT 0.8  ?PROT 6.9  ?ALBUMIN 3.6  ? ?No results for input(s): LIPASE, AMYLASE in the last 168 hours. ?Recent Labs  ?Lab 10/08/21 ?0023  ?AMMONIA 27  ? ?Coagulation Profile: ?No results for input(s): INR, PROTIME in the last 168 hours. ?Cardiac Enzymes: ?No results for input(s): CKTOTAL, CKMB, CKMBINDEX, TROPONINI in the last  168 hours. ?BNP (last 3 results) ?No results for input(s): PROBNP in the last 8760 hours. ?HbA1C: ?No results for input(s): HGBA1C in the last 72 hours. ?CBG: ?Recent Labs  ?Lab 10/08/21 ?2027 10/08/21 ?2307 10/08/21 ?2353 10/09/21 ?0409 10/09/21 ?6063  ?GLUCAP 85 99 141* 73 137*  ? ?Lipid Profile: ?No results for input(s): CHOL, HDL, LDLCALC, TRIG, CHOLHDL, LDLDIRECT in the last 72 hours. ?Thyroid Function Tests: ?No results for input(s): TSH, T4TOTAL, FREET4, T3FREE, THYROIDAB in the last 72 hours. ?Anemia Panel: ?Recent Labs  ?  10/07/21 ?1948  ?[id number] 1,809*  ? ?Sepsis Labs: ?No results for input(s): PROCALCITON, LATICACIDVEN in the last 168 hours. ? ?Recent Results (from the past 240 hour(s))  ?Resp Panel by RT-PCR (Flu A&B, Covid) Nasopharyngeal Swab     Status: None  ? Collection Time: 10/07/21  7:48 PM  ? Specimen: Nasopharyngeal Swab; Nasopharyngeal(NP) swabs in vial transport medium  ?Result Value Ref Range Status  ? SARS Coronavirus 2 by RT PCR NEGATIVE NEGATIVE Final  ?  Comment: (NOTE) ?SARS-CoV-2 target nucleic acids are NOT DETECTED. ? ?The SARS-CoV-2 RNA is generally detectable in upper respiratory ?specimens during the acute phase of infection. The lowest ?concentration of SARS-CoV-2 viral copies this assay can detect is ?138 copies/mL. A negative result does not preclude SARS-Cov-2 ?infection and should not be used as the sole basis for treatment or ?other patient management decisions. A negative result may occur with  ?improper specimen collection/handling, submission of specimen other ?than nasopharyngeal swab, presence of viral mutation(s) within the ?areas targeted by this assay, and inadequate number of viral ?copies(<138 copies/mL). A negative result must be combined with ?clinical observations, patient history, and epidemiological ?information. The expected result is Negative. ? ?Fact Sheet for Patients:  ?EntrepreneurPulse.com.au ? ?Fact Sheet for Healthcare Providers:   ?IncredibleEmployment.be ? ?This test is no t yet approved or cleared by the [place] FDA and  ?has been authorized for detection and/or diagnosis of SARS-CoV-2 by ?FDA under an Emergency Use Authorization (EUA). This EUA will remain  ?in effect (meaning this test can be used) for the duration of the ?COVID-19 declaration under Section 564(b)(1) of the Act, 21 ?U.S.C.section 360bbb-3(b)(1), unless the authorization is terminated  ?or revoked sooner.  ? ? ?  ? Influenza A by PCR NEGATIVE NEGATIVE Final  ? Influenza B by PCR NEGATIVE NEGATIVE Final  ?  Comment: (NOTE) ?The Xpert Xpress SARS-CoV-2/FLU/RSV plus assay is intended as an aid ?in the diagnosis of influenza from Nasopharyngeal swab specimens and ?should not be used as a sole basis for treatment. Nasal washings and ?aspirates are unacceptable for Xpert Xpress SARS-CoV-2/FLU/RSV ?testing. ? ?Fact Sheet for Patients: ?EntrepreneurPulse.com.au ? ?Fact Sheet for Healthcare Providers: ?IncredibleEmployment.be ? ?This test is not yet approved or cleared by the [place] FDA and ?has been authorized for detection and/or diagnosis of SARS-CoV-2 by ?FDA under an Emergency Use Authorization (EUA). This EUA will remain ?in effect (meaning this test can be used) for the duration of  the ?COVID-19 declaration under Section 564(b)(1) of the Act, 21 U.S.C. ?section 360bbb-3(b)(1), unless the authorization is terminated or ?revoked. ? ?Performed at [facility], [place], ?[place] [address] ?  ?  ? ? ? ? ? ?Radiology Studies: ?CT Head Wo Contrast ? ?Result Date: 10/07/2021 ?CLINICAL DATA:  Altered mental status EXAM: CT HEAD WITHOUT CONTRAST TECHNIQUE: Contiguous axial images were obtained from the base of the skull through the vertex without intravenous contrast. RADIATION DOSE REDUCTION: This exam was performed according to the departmental dose-optimization program which includes  automated exposure control, adjustment of the mA and/or kV according to patient size and/or use of iterative reconstruction technique. COMPARISON:  10/09/2019 FINDINGS: Brain: No acute intracranial findings are seen in the noncontrast

## 2021-10-09 NOTE — Progress Notes (Addendum)
Mobility Specialist - Progress Note ? ? 10/09/21 1600  ?Mobility  ?Activity Stood at bedside  ?Level of Assistance Maximum assist, patient does 25-49%  ?Assistive Device Front wheel walker  ?Distance Ambulated (ft) 0 ft  ?Activity Response Tolerated well  ?$Mobility charge 1 Mobility  ? ? ? ?Pt sleeping on arrival, utilizing RA. Pt awakened to voice but lethargic; still agreeable to activity however. Wet cloth applied to face for further arousal---assist to bring R hand to face for removal of cloth; unsure if this is d/t cognitive impairment or poor spatial awareness. Pt able to sit EOB with minA and task initiation. Tolerated EOB for ~10 minutes with good sitting balance. Pt attempted standing x1 from elevated bed height but unable to elevate from bedside with maxA. Assist to place L hand on RW. Pt returned supine with assist on LE. +2 to roll L/R in bed for bed chuck placement and HOB positioning. Pt left in bed with alarm set, needs in reach. RN notified.  ? ? ?Kathee Delton ?Mobility Specialist ?10/09/21, 4:40 PM ? ? ? ? ?

## 2021-10-09 NOTE — Progress Notes (Signed)
[place] ? ?MRN: [id number] ? ?DOB/AGE: 10-23-1960 61 y.o. ? ?Primary Care Physician:Tejan-Sie, Brandt Loosen, MD ? ?Admit date: 10/07/2021 ? ?Chief Complaint:  ?Chief Complaint  ?Patient presents with  ? Hypertension  ? Altered Mental Status  ? ? ?S-Pt presented on  10/07/2021 with  ?Chief Complaint  ?Patient presents with  ? Hypertension  ? Altered Mental Status  ?. ?[name] is a 61 year old male with history of hypertension, ESRD on HD via left AV fistula, schizophrenia, GERD, muscle spasms, who presents emergency department for chief concerns of altered mental status and high blood pressure. ? ?Nephrology was consulted for comanagement of dialysis patient ?Patient  remains confused ?Patient was unable to give much history ?Patient history has been taken from medical records and patient's team ? ? ?Medication  ? amLODipine  10 mg Oral Daily  ? calcium acetate  1,334 mg Oral TID WC  ? carbamazepine  1,000 mg Oral q AM  ? Chlorhexidine Gluconate Cloth  6 each Topical Q0600  ? cholecalciferol  1,000 Units Oral Daily  ? cloNIDine  0.3 mg Oral TID  ? epoetin (EPOGEN/PROCRIT) injection  3,000 Units Intravenous Q M,W,F-HD  ? ferrous sulfate  325 mg Oral Daily  ? fluPHENAZine  2.5 mg Oral Daily  ? heparin  5,000 Units Subcutaneous Q8H  ? hydrALAZINE  100 mg Oral Q8H  ? losartan  100 mg Oral Daily  ? mometasone-formoterol  2 puff Inhalation BID  ? multivitamin  1 tablet Oral QHS  ? pantoprazole  80 mg Oral Daily  ? trihexyphenidyl  2 mg Oral BID  ? ? ? ? ? ? ? ?ROS: Unable to get any data ? ? ?Physical Exam: ?Vital signs in last 24 hours: ?Temp:  [97.7 ?F (36.5 ?C)-98.2 ?F (36.8 ?C)] 98.1 ?F (36.7 ?C) (05/11 2016) ?Pulse Rate:  [52-66] 57 (05/11 2016) ?Resp:  [16-18] 18 (05/11 2016) ?BP: (125-191)/(70-139) 164/139 (05/11 2016) ?SpO2:  [95 %-98 %] 95 % (05/11 2016) ?Weight change:  ?Last BM Date :  (PTA) ? ?Intake/Output from previous day: ?05/10 0701 - 05/11 0700 ?In: 325.6 [P.O.:300; I.V.:25.6] ?Out: 1000  ?No  intake/output data recorded. ? ? ?Physical Exam: ? ?General- pt is alert, but not oriented to time place and person ? ?HEENT-head is atraumatic normocephalic ? ?Resp- No acute REsp distress, CTA B/L NO Rhonchi ? ?CVS- S1S2 regular in rate and rhythm ? ?GIT- BS+, soft, Non tender , Non distended ? ?EXT- No LE Edema,  No Cyanosis ? ?Access- LUE  AVF-aneurysm present ? ?Lab Results: ? ?CBC ? ?Recent Labs  ?  10/07/21 ?1948  ?WBC 4.2  ?HGB 10.4*  ?HCT 31.0*  ?PLT 188  ? ? ?BMET ? ?Recent Labs  ?  10/07/21 ?1948 10/09/21 ?4970  ?NA 140 134*  ?K 3.8 3.8  ?CL 98 96*  ?CO2 30 30  ?GLUCOSE 86 139*  ?BUN 31* 24*  ?CREATININE 5.79* 5.09*  ?CALCIUM 10.0 9.3  ? ? ? ? ?Most recent Creatinine trend  ?Lab Results  ?Component Value Date  ? CREATININE 5.09 (H) 10/09/2021  ? CREATININE 5.79 (H) 10/07/2021  ? CREATININE 6.65 (H) 02/18/2021  ?  ? ? ?MICRO ? ? ?Recent Results (from the past 240 hour(s))  ?Resp Panel by RT-PCR (Flu A&B, Covid) Nasopharyngeal Swab     Status: None  ? Collection Time: 10/07/21  7:48 PM  ? Specimen: Nasopharyngeal Swab; Nasopharyngeal(NP) swabs in vial transport medium  ?Result Value Ref Range Status  ? SARS Coronavirus 2  by RT PCR NEGATIVE NEGATIVE Final  ?  Comment: (NOTE) ?SARS-CoV-2 target nucleic acids are NOT DETECTED. ? ?The SARS-CoV-2 RNA is generally detectable in upper respiratory ?specimens during the acute phase of infection. The lowest ?concentration of SARS-CoV-2 viral copies this assay can detect is ?138 copies/mL. A negative result does not preclude SARS-Cov-2 ?infection and should not be used as the sole basis for treatment or ?other patient management decisions. A negative result may occur with  ?improper specimen collection/handling, submission of specimen other ?than nasopharyngeal swab, presence of viral mutation(s) within the ?areas targeted by this assay, and inadequate number of viral ?copies(<138 copies/mL). A negative result must be combined with ?clinical observations, patient  history, and epidemiological ?information. The expected result is Negative. ? ?Fact Sheet for Patients:  ?EntrepreneurPulse.com.au ? ?Fact Sheet for Healthcare Providers:  ?IncredibleEmployment.be ? ?This test is no t yet approved or cleared by the [place] FDA and  ?has been authorized for detection and/or diagnosis of SARS-CoV-2 by ?FDA under an Emergency Use Authorization (EUA). This EUA will remain  ?in effect (meaning this test can be used) for the duration of the ?COVID-19 declaration under Section 564(b)(1) of the Act, 21 ?U.S.C.section 360bbb-3(b)(1), unless the authorization is terminated  ?or revoked sooner.  ? ? ?  ? Influenza A by PCR NEGATIVE NEGATIVE Final  ? Influenza B by PCR NEGATIVE NEGATIVE Final  ?  Comment: (NOTE) ?The Xpert Xpress SARS-CoV-2/FLU/RSV plus assay is intended as an aid ?in the diagnosis of influenza from Nasopharyngeal swab specimens and ?should not be used as a sole basis for treatment. Nasal washings and ?aspirates are unacceptable for Xpert Xpress SARS-CoV-2/FLU/RSV ?testing. ? ?Fact Sheet for Patients: ?EntrepreneurPulse.com.au ? ?Fact Sheet for Healthcare Providers: ?IncredibleEmployment.be ? ?This test is not yet approved or cleared by the [place] FDA and ?has been authorized for detection and/or diagnosis of SARS-CoV-2 by ?FDA under an Emergency Use Authorization (EUA). This EUA will remain ?in effect (meaning this test can be used) for the duration of the ?COVID-19 declaration under Section 564(b)(1) of the Act, 21 U.S.C. ?section 360bbb-3(b)(1), unless the authorization is terminated or ?revoked. ? ?Performed at [facility], [place], ?[place] [address] ?  ?  ? ? ? ? ? ?Impression: ? ? ?1)Renal   ? ?ESRD on hemodialysis ?Patient is on Monday Wednesday Friday schedule. ?As an outpatient patient is under care for [facility] and get dialyzed at [facility] on [address] on Monday  Wednesday Friday schedule. ? ?Pt was last dialyzed yesterday  ? ? ?2) hypertensive urgency ? ? patient blood control is better than before ?I agree with the blood pressure medication changes per the primary team ?We will follow ? ? ? ? ?3)Anemia of chronic disease ? ? ?  Latest Ref Rng & Units 10/07/2021  ?  7:48 PM 02/18/2021  ? 12:51 PM 02/14/2021  ?  8:58 AM  ?CBC  ?WBC 4.0 - 10.5 K/uL 4.2   7.2     ?Hemoglobin 13.0 - 17.0 g/dL 10.4   8.8   9.5    ?Hematocrit 39.0 - 52.0 % 31.0   25.3   28.0    ?Platelets 150 - 400 K/uL 188   151     ?  ? ? ? HGb at goal (9--11) ? ? ?4) Secondary hyperparathyroidism -CKD Mineral-Bone Disorder ? ? ? ?Lab Results  ?Component Value Date  ? PTH 194 (H) 05/11/2017  ? CALCIUM 9.3 10/09/2021  ? CAION 1.17 02/14/2021  ?  PHOS 3.2 10/20/2019  ? ? ?Secondary Hyperparathyroidism present ? ?Phosphorus at goal. ? ? ?5) altered mental status ?Patient being followed by the primary team ? ?6) Electrolytes  ? ? ?  Latest Ref Rng & Units 10/09/2021  ?  6:48 AM 10/07/2021  ?  7:48 PM 02/18/2021  ? 12:51 PM  ?BMP  ?Glucose 70 - 99 mg/dL 139   86   76    ?BUN 6 - 20 mg/dL 24   31   30     ?Creatinine 0.61 - 1.24 mg/dL 5.09   5.79   6.65    ?Sodium 135 - 145 mmol/L 134   140   139    ?Potassium 3.5 - 5.1 mmol/L 3.8   3.8   4.0    ?Chloride 98 - 111 mmol/L 96   98   95    ?CO2 22 - 32 mmol/L 30   30   31     ?Calcium 8.9 - 10.3 mg/dL 9.3   10.0   9.7    ?  ? ?Sodium ?Normonatremic ? ? ?Potassium ?Normokalemic ? ? ? ?7)Acid base ? ? ? ?Co2 at goal ? ? ? ? ?Plan: ? ? ?No need for renal replacement therapy today ?We will dialyze patient in the morning ? ? ?Mireyah Chervenak s Theador Hawthorne ?10/09/2021, 9:37 PM  ?

## 2021-10-10 DIAGNOSIS — I160 Hypertensive urgency: Secondary | ICD-10-CM | POA: Diagnosis not present

## 2021-10-10 LAB — BASIC METABOLIC PANEL
Anion gap: 8 (ref 5–15)
BUN: 35 mg/dL — ABNORMAL HIGH (ref 6–20)
CO2: 31 mmol/L (ref 22–32)
Calcium: 9.4 mg/dL (ref 8.9–10.3)
Chloride: 95 mmol/L — ABNORMAL LOW (ref 98–111)
Creatinine, Ser: 6.63 mg/dL — ABNORMAL HIGH (ref 0.61–1.24)
GFR, Estimated: 9 mL/min — ABNORMAL LOW (ref >60)
Glucose, Bld: 77 mg/dL (ref 70–99)
Potassium: 4.3 mmol/L (ref 3.5–5.1)
Sodium: 134 mmol/L — ABNORMAL LOW (ref 135–145)

## 2021-10-10 LAB — MAGNESIUM: Magnesium: 1.9 mg/dL (ref 1.7–2.4)

## 2021-10-10 MED ORDER — HEPARIN SODIUM (PORCINE) 1000 UNIT/ML IJ SOLN
INTRAMUSCULAR | Status: AC
  Administered 2021-10-10: 1000 [IU] via INTRAVENOUS_CENTRAL
  Filled 2021-10-10: qty 10

## 2021-10-10 MED ORDER — EPOETIN ALFA 4000 UNIT/ML IJ SOLN
INTRAMUSCULAR | Status: AC
  Filled 2021-10-10: qty 1

## 2021-10-10 MED ORDER — NIFEDIPINE ER OSMOTIC RELEASE 30 MG PO TB24
90.0000 mg | ORAL_TABLET | Freq: Every day | ORAL | Status: DC
  Administered 2021-10-10 – 2021-10-16 (×7): 90 mg via ORAL
  Filled 2021-10-10 (×7): qty 1

## 2021-10-10 NOTE — Evaluation (Signed)
Physical Therapy Evaluation ?Patient Details ?Name: Todd Brown ?MRN: [id number] ?DOB: 05-Mar-1961 ?Today's Date: 10/10/2021 ? ?History of Present Illness ? Pt is a 61 year old male with history of hypertension, ESRD on HD via left AV fistula, schizophrenia, GERD, muscle spasms, who presents emergency department for chief concerns of altered mental status and high blood pressure. MD assessment includes: Hypertensive urgency and AMS. ?  ?Clinical Impression ? Pt lethargic during the session but put forth fair effort and followed some simple commands with extra time and cuing.  Per required significant physical assistance with bed mobility tasks and sit to/from stand transfers and was unable to SPT to his recliner even with heavy +2 assist.  This is a significant decline in functional status compared to his baseline as communicated by ALF staff via phone call.  Pt will benefit from PT services in a SNF setting upon discharge to safely address deficits listed in patient problem list for decreased caregiver assistance and eventual return to PLOF. ?   ?   ? ?Recommendations for follow up therapy are one component of a multi-disciplinary discharge planning process, led by the attending physician.  Recommendations may be updated based on patient status, additional functional criteria and insurance authorization. ? ?Follow Up Recommendations Skilled nursing-short term rehab (<3 hours/day) ? ?  ?Assistance Recommended at Discharge Frequent or constant Supervision/Assistance  ?Patient can return home with the following ? Two people to help with walking and/or transfers;A lot of help with bathing/dressing/bathroom;Direct supervision/assist for medications management;Assist for transportation;Help with stairs or ramp for entrance ? ?  ?Equipment Recommendations None recommended by PT  ?Recommendations for Other Services ?    ?  ?Functional Status Assessment Patient has had a recent decline in their functional status and  demonstrates the ability to make significant improvements in function in a reasonable and predictable amount of time.  ? ?  ?Precautions / Restrictions Precautions ?Precautions: Fall ?Restrictions ?Weight Bearing Restrictions: No ?Other Position/Activity Restrictions: LUE fistula  ? ?  ? ?Mobility ? Bed Mobility ?Overal bed mobility: Needs Assistance ?Bed Mobility: Supine to Sit, Sit to Supine ?  ?  ?Supine to sit: Mod assist ?Sit to supine: Max assist ?  ?General bed mobility comments: Assist with BLE and trunk control needed ?  ? ?Transfers ?Overall transfer level: Needs assistance ?Equipment used: Rolling walker (2 wheels), None ?Transfers: Sit to/from Stand, Bed to chair/wheelchair/BSC ?Sit to Stand: Mod assist, +2 physical assistance, From elevated surface ?Stand pivot transfers: +2 physical assistance, Max assist ?  ?  ?  ?  ?General transfer comment: Pt able to come to standing with RW at elevated EOB with heavy +2 assist but unable to advance either LE during attempt to turn toward the recliner; SPT attempted with +2 max A with pt unable to perform the transfer safely secondary to BLE buckling ?  ? ?Ambulation/Gait ?  ?  ?  ?  ?  ?  ?  ?General Gait Details: Unable ? ?Stairs ?  ?  ?  ?  ?  ? ?Wheelchair Mobility ?  ? ?Modified Rankin (Stroke Patients Only) ?  ? ?  ? ?Balance Overall balance assessment: Needs assistance ?Sitting-balance support: Bilateral upper extremity supported, Feet supported ?Sitting balance-[name]: Poor ?Sitting balance - Comments: frequent min to mod A for stability in sitting at EOB ?  ?Standing balance support: Bilateral upper extremity supported, During functional activity ?Standing balance-[name]: Poor ?  ?  ?  ?  ?  ?  ?  ?  ?  ?  ?  ?  ?   ? ? ? ?  Pertinent Vitals/Pain Pain Assessment ?Pain Assessment: No/denies pain  ? ? ?Home Living Family/patient expects to be discharged to:: Assisted living ?  ?  ?  ?  ?  ?  ?  ?  ?Home Equipment: Wheelchair - manual ?   ?  ?Prior  Function Prior Level of Function : Needs assist ?  ?  ?  ?  ?  ?  ?Mobility Comments: Per staff at [facility] pt Ind with bed mobility tasks and SBA/CGA with SPT bed to/from w/c, no fall history, pt requires assist to propel the w/c ?ADLs Comments: Assist from ALF staff for ADLs ?  ? ? ?Hand Dominance  ?   ? ?  ?Extremity/Trunk Assessment  ? Upper Extremity Assessment ?Upper Extremity Assessment: Generalized weakness ?  ? ?Lower Extremity Assessment ?Lower Extremity Assessment: Generalized weakness ?  ? ?   ?Communication  ? Communication: Expressive difficulties  ?Cognition Arousal/Alertness: Lethargic ?Behavior During Therapy: Flat affect ?Overall Cognitive Status: No family/caregiver present to determine baseline cognitive functioning ?  ?  ?  ?  ?  ?  ?  ?  ?  ?  ?  ?  ?  ?  ?  ?  ?General Comments: Pt alert to name and able to follow some simple commands with extra time and cuing ?  ?  ? ?  ?General Comments   ? ?  ?Exercises Other Exercises ?Other Exercises: Static and dynamic sitting balance training at EOB ?Other Exercises: Multiple sit to/from stands during transfer training from elevated EOB  ? ?Assessment/Plan  ?  ?PT Assessment Patient needs continued PT services  ?PT Problem List Decreased strength;Decreased activity tolerance;Decreased balance;Decreased mobility;Decreased knowledge of use of DME ? ?   ?  ?PT Treatment Interventions     ? ?PT Goals (Current goals can be found in the Care Plan section)  ?Acute Rehab PT Goals ?PT Goal Formulation: Patient unable to participate in goal setting ?Time For Goal Achievement: 10/23/21 ?Potential to Achieve Goals: Good ? ?  ?Frequency Min 2X/week ?  ? ? ?Co-evaluation   ?  ?  ?  ?  ? ? ?  ?AM-PAC PT "6 Clicks" Mobility  ?Outcome Measure Help needed turning from your back to your side while in a flat bed without using bedrails?: A Lot ?Help needed moving from lying on your back to sitting on the side of a flat bed without using bedrails?: A Lot ?Help needed  moving to and from a bed to a chair (including a wheelchair)?: Total ?Help needed standing up from a chair using your arms (e.g., wheelchair or bedside chair)?: A Lot ?Help needed to walk in hospital room?: Total ?Help needed climbing 3-5 steps with a railing? : Total ?6 Click Score: 9 ? ?  ?End of Session Equipment Utilized During Treatment: Gait belt ?Activity Tolerance: Patient limited by fatigue ?Patient left: in bed;with call bell/phone within reach;with bed alarm set ?Nurse Communication: Mobility status ?PT Visit Diagnosis: Unsteadiness on feet (R26.81);Muscle weakness (generalized) (M62.81) ?  ? ?Time: 9767-3419 ?PT Time Calculation (min) (ACUTE ONLY): 29 min ? ? ?Charges:   PT Evaluation ?$PT Eval Moderate Complexity: 1 Mod ?PT Treatments ?$Therapeutic Activity: 8-22 mins ?  ?   ? ?D. Royetta Asal PT, DPT ?10/10/21, 4:06 PM ? ? ?

## 2021-10-10 NOTE — Progress Notes (Signed)
?PROGRESS NOTE ? ? ? ?Todd Brown  MVH:[id number] DOB: 03-14-1961 DOA: 10/07/2021 ?PCP: Jodi Marble, MD  ? ?Brief Narrative:  ?61 year old with history of HTN, ESRD on HD via left AV fistula, schizophrenia, GERD, muscle spasm came to the ED with complaints of altered mental status and high blood pressure.  Initially patient's systolic blood pressure was greater than 200.  CT of the head was negative.  He was diagnosed with hypertensive urgency started on home medication and briefly on Cardene drip.  Nephrology was consulted for hemodialysis.  PT is recommending SNF. ? ? ?Assessment & Plan: ? Principal Problem: ?  Hypertensive urgency ?Active Problems: ?  Schizophrenia ([place]) ?  Essential hypertension, benign ?  End stage renal disease ([place]) ?  Dialysis patient [facility]) ?  GERD (gastroesophageal reflux disease) ?  Altered mental status ?  ? ? ?Assessment and Plan: ?* Hypertensive urgency ?- BP still remains elevated.  Losartan increased to 100 mg daily, hydralazine changed to 100 mg 3 times daily.  Norvasc 10 mg daily.  ? ?Altered mental status, appears to be at baseline ?- Patient resides at [place] years.  Ammonia, normal B12, elevated ? ?GERD (gastroesophageal reflux disease) ?- PPI ? ?End stage renal disease ([place]) ?- Nephrology consulted for hemodialysis ? ?Essential hypertension, benign ?- I have resumed home antihypertensive medications and as needed coverage ? ?Schizophrenia ([place]) ?- With history of garbled and pressured speech with possible cognitive impairment per psychiatry note on 10/04/2019 ?- Resumed home fluphenazine 2.5 mg daily, trihexyphenidyl 2 mg daily, carbamazepine 1000 mg q. every morning ? ? ? ?DVT prophylaxis: Subcu heparin ?Code Status: Full code ?Family Communication: Spoke with Candace and patient's RN at the group home [facility] [phone] ? ?Status is: Inpatient ?Remains inpatient appropriate because: PT is recommending SNF. ? ? ?Subjective: ?No new complaints, no  acute events overnight ? ?Examination: ?Constitutional: Not in acute distress, appears chronically ill.  Garbled speech ?Respiratory: Clear to auscultation bilaterally ?Cardiovascular: Normal sinus rhythm, no rubs ?Abdomen: Nontender nondistended good bowel sounds ?Musculoskeletal: No edema noted ?Skin: No rashes seen ?Neurologic: Nonfocal exam ?Psychiatric: Poor judgment and insight ?Objective: ?Vitals:  ? 10/10/21 1400 10/10/21 1409 10/10/21 1432 10/10/21 1433  ?BP: (!) 157/88  (!) 179/84   ?Pulse:   (!) 57   ?Resp: 12 (!) 4 16   ?Temp:   98.1 ?F (36.7 ?C)   ?TempSrc:      ?SpO2:   97%   ?Weight:    67.7 kg  ?Height:      ? ? ?Intake/Output Summary (Last 24 hours) at 10/10/2021 1648 ?Last data filed at 10/10/2021 1339 ?Gross per 24 hour  ?Intake 60 ml  ?Output 2041 ml  ?Net -1981 ml  ? ?Filed Weights  ? 10/10/21 0400 10/10/21 1433  ?Weight: 73.9 kg 67.7 kg  ? ? ? ?Data Reviewed:  ? ?CBC: ?Recent Labs  ?Lab 10/07/21 ?1948  ?WBC 4.2  ?NEUTROABS 2.5  ?HGB 10.4*  ?HCT 31.0*  ?MCV 98.4  ?PLT 188  ? ?Basic Metabolic Panel: ?Recent Labs  ?Lab 10/07/21 ?1948 10/09/21 ?3474 10/10/21 ?2595  ?NA 140 134* 134*  ?K 3.8 3.8 4.3  ?CL 98 96* 95*  ?CO2 30 30 31   ?GLUCOSE 86 139* 77  ?BUN 31* 24* 35*  ?CREATININE 5.79* 5.09* 6.63*  ?CALCIUM 10.0 9.3 9.4  ?MG  --  1.9 1.9  ? ?GFR: ?Estimated Creatinine Clearance: 11.3 mL/min (A) (by C-G formula based on SCr of 6.63 mg/dL (  H)). ?Liver Function Tests: ?Recent Labs  ?Lab 10/07/21 ?1948  ?AST 25  ?ALT 13  ?ALKPHOS 51  ?BILITOT 0.8  ?PROT 6.9  ?ALBUMIN 3.6  ? ?No results for input(s): LIPASE, AMYLASE in the last 168 hours. ?Recent Labs  ?Lab 10/08/21 ?0023  ?AMMONIA 27  ? ?Coagulation Profile: ?No results for input(s): INR, PROTIME in the last 168 hours. ?Cardiac Enzymes: ?No results for input(s): CKTOTAL, CKMB, CKMBINDEX, TROPONINI in the last 168 hours. ?BNP (last 3 results) ?No results for input(s): PROBNP in the last 8760 hours. ?HbA1C: ?No results for input(s): HGBA1C in the last 72  hours. ?CBG: ?Recent Labs  ?Lab 10/09/21 ?1216 10/09/21 ?1646 10/09/21 ?2014 10/09/21 ?2328 10/09/21 ?2332  ?GLUCAP 112* 88 97 94 92  ? ?Lipid Profile: ?No results for input(s): CHOL, HDL, LDLCALC, TRIG, CHOLHDL, LDLDIRECT in the last 72 hours. ?Thyroid Function Tests: ?No results for input(s): TSH, T4TOTAL, FREET4, T3FREE, THYROIDAB in the last 72 hours. ?Anemia Panel: ?Recent Labs  ?  10/07/21 ?1948  ?[id number] 1,809*  ? ?Sepsis Labs: ?No results for input(s): PROCALCITON, LATICACIDVEN in the last 168 hours. ? ?Recent Results (from the past 240 hour(s))  ?Resp Panel by RT-PCR (Flu A&B, Covid) Nasopharyngeal Swab     Status: None  ? Collection Time: 10/07/21  7:48 PM  ? Specimen: Nasopharyngeal Swab; Nasopharyngeal(NP) swabs in vial transport medium  ?Result Value Ref Range Status  ? SARS Coronavirus 2 by RT PCR NEGATIVE NEGATIVE Final  ?  Comment: (NOTE) ?SARS-CoV-2 target nucleic acids are NOT DETECTED. ? ?The SARS-CoV-2 RNA is generally detectable in upper respiratory ?specimens during the acute phase of infection. The lowest ?concentration of SARS-CoV-2 viral copies this assay can detect is ?138 copies/mL. A negative result does not preclude SARS-Cov-2 ?infection and should not be used as the sole basis for treatment or ?other patient management decisions. A negative result may occur with  ?improper specimen collection/handling, submission of specimen other ?than nasopharyngeal swab, presence of viral mutation(s) within the ?areas targeted by this assay, and inadequate number of viral ?copies(<138 copies/mL). A negative result must be combined with ?clinical observations, patient history, and epidemiological ?information. The expected result is Negative. ? ?Fact Sheet for Patients:  ?EntrepreneurPulse.com.au ? ?Fact Sheet for Healthcare Providers:  ?IncredibleEmployment.be ? ?This test is no t yet approved or cleared by the [place] FDA and  ?has been authorized for  detection and/or diagnosis of SARS-CoV-2 by ?FDA under an Emergency Use Authorization (EUA). This EUA will remain  ?in effect (meaning this test can be used) for the duration of the ?COVID-19 declaration under Section 564(b)(1) of the Act, 21 ?U.S.C.section 360bbb-3(b)(1), unless the authorization is terminated  ?or revoked sooner.  ? ? ?  ? Influenza A by PCR NEGATIVE NEGATIVE Final  ? Influenza B by PCR NEGATIVE NEGATIVE Final  ?  Comment: (NOTE) ?The Xpert Xpress SARS-CoV-2/FLU/RSV plus assay is intended as an aid ?in the diagnosis of influenza from Nasopharyngeal swab specimens and ?should not be used as a sole basis for treatment. Nasal washings and ?aspirates are unacceptable for Xpert Xpress SARS-CoV-2/FLU/RSV ?testing. ? ?Fact Sheet for Patients: ?EntrepreneurPulse.com.au ? ?Fact Sheet for Healthcare Providers: ?IncredibleEmployment.be ? ?This test is not yet approved or cleared by the [place] FDA and ?has been authorized for detection and/or diagnosis of SARS-CoV-2 by ?FDA under an Emergency Use Authorization (EUA). This EUA will remain ?in effect (meaning this test can be used) for the duration of the ?COVID-19 declaration under Section 564(b)(1) of the Act, 21  U.S.C. ?section 360bbb-3(b)(1), unless the authorization is terminated or ?revoked. ? ?Performed at [facility], [place], ?[place] [address] ?  ?  ? ? ? ? ? ?Radiology Studies: ?No results found. ? ? ? ? ? ?Scheduled Meds: ? amLODipine  10 mg Oral Daily  ? calcium acetate  1,334 mg Oral TID WC  ? carbamazepine  1,000 mg Oral q AM  ? Chlorhexidine Gluconate Cloth  6 each Topical Q0600  ? cholecalciferol  1,000 Units Oral Daily  ? cloNIDine  0.3 mg Oral TID  ? epoetin alfa      ? epoetin (EPOGEN/PROCRIT) injection  3,000 Units Intravenous Q M,W,F-HD  ? ferrous sulfate  325 mg Oral Daily  ? fluPHENAZine  2.5 mg Oral Daily  ? heparin  5,000 Units Subcutaneous Q8H  ? hydrALAZINE  100 mg  Oral Q8H  ? losartan  100 mg Oral Daily  ? mometasone-formoterol  2 puff Inhalation BID  ? multivitamin  1 tablet Oral QHS  ? pantoprazole  80 mg Oral Daily  ? trihexyphenidyl  2 mg Oral BID  ? ?Continuous I

## 2021-10-10 NOTE — Progress Notes (Signed)
OT Cancellation Note ? ?Patient Details ?Name: Todd Brown ?MRN: [id number] ?DOB: 1960-12-05 ? ? ?Cancelled Treatment:    Reason Eval/Treat Not Completed: Other (comment);Patient at procedure or test/ unavailable (orders recieved,chart reviewed. pt off floor at HD at this time. OT will reattempt as able.) ?Shanon Payor, OTD OTR/L  ?10/10/21, 3:10 PM  ?

## 2021-10-10 NOTE — Progress Notes (Signed)
[place] ? ?MRN: [id number] ? ?DOB/AGE: [birth year] 61 y.o. ? ?Primary Care Physician:Tejan-Sie, Brandt Loosen, MD ? ?Admit date: 10/07/2021 ? ?Chief Complaint:  ?Chief Complaint  ?Patient presents with  ? Hypertension  ? Altered Mental Status  ? ? ?S-Pt presented on  10/07/2021 with  ?Chief Complaint  ?Patient presents with  ? Hypertension  ? Altered Mental Status  ?. ?[name] is a 61 year old male with history of hypertension, ESRD on HD via left AV fistula, schizophrenia, GERD, muscle spasms, who presents emergency department for chief concerns of altered mental status and high blood pressure. ? ?Nephrology was consulted for comanagement of dialysis patient ? ?Patient was seen today on second floor ?Patient was much more alert than yesterday ?Patient offers no new specific physical complaints  ? ? ?Medication  ? amLODipine  10 mg Oral Daily  ? calcium acetate  1,334 mg Oral TID WC  ? carbamazepine  1,000 mg Oral q AM  ? Chlorhexidine Gluconate Cloth  6 each Topical Q0600  ? cholecalciferol  1,000 Units Oral Daily  ? cloNIDine  0.3 mg Oral TID  ? epoetin alfa      ? epoetin (EPOGEN/PROCRIT) injection  3,000 Units Intravenous Q M,W,F-HD  ? ferrous sulfate  325 mg Oral Daily  ? fluPHENAZine  2.5 mg Oral Daily  ? heparin  5,000 Units Subcutaneous Q8H  ? hydrALAZINE  100 mg Oral Q8H  ? losartan  100 mg Oral Daily  ? mometasone-formoterol  2 puff Inhalation BID  ? multivitamin  1 tablet Oral QHS  ? pantoprazole  80 mg Oral Daily  ? trihexyphenidyl  2 mg Oral BID  ? ? ? ? ? ? ? ?ROS: Unable to get any data ? ? ?Physical Exam: ?Vital signs in last 24 hours: ?Temp:  [98.1 ?F (36.7 ?C)-98.9 ?F (37.2 ?C)] 98.1 ?F (36.7 ?C) (05/12 1432) ?Pulse Rate:  [57-89] 57 (05/12 1432) ?Resp:  [4-19] 16 (05/12 1432) ?BP: (136-194)/(73-139) 179/84 (05/12 1432) ?SpO2:  [93 %-100 %] 97 % (05/12 1432) ?Weight:  [67.7 kg-73.9 kg] 67.7 kg (05/12 1433) ?Weight change:  ?Last BM Date :  (PTA) ? ?Intake/Output from previous day: ?05/11  0701 - 05/12 0700 ?In: 49 [P.O.:420] ?Out: -  ?Total I/O ?In: -  ?Out: 2041 [Other:2041] ? ? ?Physical Exam: ? ?General- pt is alert, and responds more than yesterady  ? ?Resp- No acute REsp distress, CTA B/L NO Rhonchi ? ?CVS- S1S2 regular in rate and rhythm ? ?GIT- BS+, soft, Non tender , Non distended ? ?EXT- No LE Edema,  No Cyanosis ? ?Access- LUE  AVF-aneurysm present ?             Permacath in situ, patient catheter is being used ? ?Lab Results: ? ?CBC ? ?Recent Labs  ?  10/07/21 ?1948  ?WBC 4.2  ?HGB 10.4*  ?HCT 31.0*  ?PLT 188  ? ? ?BMET ? ?Recent Labs  ?  10/09/21 ?0648 10/10/21 ?0613  ?NA 134* 134*  ?K 3.8 4.3  ?CL 96* 95*  ?CO2 30 31  ?GLUCOSE 139* 77  ?BUN 24* 35*  ?CREATININE 5.09* 6.63*  ?CALCIUM 9.3 9.4  ? ? ? ? ?Most recent Creatinine trend  ?Lab Results  ?Component Value Date  ? CREATININE 6.63 (H) 10/10/2021  ? CREATININE 5.09 (H) 10/09/2021  ? CREATININE 5.79 (H) 10/07/2021  ?  ? ? ?MICRO ? ? ?Recent Results (from the past 240 hour(s))  ?Resp Panel by RT-PCR (Flu A&B, Covid) Nasopharyngeal Swab  Status: None  ? Collection Time: 10/07/21  7:48 PM  ? Specimen: Nasopharyngeal Swab; Nasopharyngeal(NP) swabs in vial transport medium  ?Result Value Ref Range Status  ? SARS Coronavirus 2 by RT PCR NEGATIVE NEGATIVE Final  ?  Comment: (NOTE) ?SARS-CoV-2 target nucleic acids are NOT DETECTED. ? ?The SARS-CoV-2 RNA is generally detectable in upper respiratory ?specimens during the acute phase of infection. The lowest ?concentration of SARS-CoV-2 viral copies this assay can detect is ?138 copies/mL. A negative result does not preclude SARS-Cov-2 ?infection and should not be used as the sole basis for treatment or ?other patient management decisions. A negative result may occur with  ?improper specimen collection/handling, submission of specimen other ?than nasopharyngeal swab, presence of viral mutation(s) within the ?areas targeted by this assay, and inadequate number of viral ?copies(<138 copies/mL).  A negative result must be combined with ?clinical observations, patient history, and epidemiological ?information. The expected result is Negative. ? ?Fact Sheet for Patients:  ?EntrepreneurPulse.com.au ? ?Fact Sheet for Healthcare Providers:  ?IncredibleEmployment.be ? ?This test is no t yet approved or cleared by the [place] FDA and  ?has been authorized for detection and/or diagnosis of SARS-CoV-2 by ?FDA under an Emergency Use Authorization (EUA). This EUA will remain  ?in effect (meaning this test can be used) for the duration of the ?COVID-19 declaration under Section 564(b)(1) of the Act, 21 ?U.S.C.section 360bbb-3(b)(1), unless the authorization is terminated  ?or revoked sooner.  ? ? ?  ? Influenza A by PCR NEGATIVE NEGATIVE Final  ? Influenza B by PCR NEGATIVE NEGATIVE Final  ?  Comment: (NOTE) ?The Xpert Xpress SARS-CoV-2/FLU/RSV plus assay is intended as an aid ?in the diagnosis of influenza from Nasopharyngeal swab specimens and ?should not be used as a sole basis for treatment. Nasal washings and ?aspirates are unacceptable for Xpert Xpress SARS-CoV-2/FLU/RSV ?testing. ? ?Fact Sheet for Patients: ?EntrepreneurPulse.com.au ? ?Fact Sheet for Healthcare Providers: ?IncredibleEmployment.be ? ?This test is not yet approved or cleared by the [place] FDA and ?has been authorized for detection and/or diagnosis of SARS-CoV-2 by ?FDA under an Emergency Use Authorization (EUA). This EUA will remain ?in effect (meaning this test can be used) for the duration of the ?COVID-19 declaration under Section 564(b)(1) of the Act, 21 U.S.C. ?section 360bbb-3(b)(1), unless the authorization is terminated or ?revoked. ? ?Performed at [facility], [place], ?[place] [address] ?  ?  ? ? ? ? ? ?Impression: ? ? ?1)Renal   ? ?ESRD on hemodialysis ?Patient is on Monday Wednesday Friday schedule. ?As an outpatient patient is  under care for [facility] and get dialyzed at [facility] on [address] on Monday Wednesday Friday schedule. ?We will dialyze patient today ? ? ?2) hypertensive urgency ? ?Patient blood pressure remains uncontrolled ?Patient hydralazine dose has been increased ?Patient is losartan dose has been increased ?Patient is on clonidine-0.3 mg p.o. 3 times daily ?Patient is on amlodipine 10 mg p.o. daily ? ? ?We will discontinue amlodipine we will start patient on Nifedipine ? ? ? ?3)Anemia of chronic disease ? ? ?  Latest Ref Rng & Units 10/07/2021  ?  7:48 PM 02/18/2021  ? 12:51 PM 02/14/2021  ?  8:58 AM  ?CBC  ?WBC 4.0 - 10.5 K/uL 4.2   7.2     ?Hemoglobin 13.0 - 17.0 g/dL 10.4   8.8   9.5    ?Hematocrit 39.0 - 52.0 % 31.0   25.3   28.0    ?Platelets 150 - 400  K/uL 188   151     ?  ? ? ? HGb at goal (9--11) ? ? ?4) Secondary hyperparathyroidism -CKD Mineral-Bone Disorder ? ? ? ?Lab Results  ?Component Value Date  ? PTH 194 (H) 05/11/2017  ? CALCIUM 9.4 10/10/2021  ? CAION 1.17 02/14/2021  ? PHOS 3.2 10/20/2019  ? ? ?Secondary Hyperparathyroidism present ? ?Phosphorus at goal. ? ? ?5) altered mental status ?Patient is clinically better ?Patient being followed by the primary team ? ?6) Electrolytes  ? ? ?  Latest Ref Rng & Units 10/10/2021  ?  6:13 AM 10/09/2021  ?  6:48 AM 10/07/2021  ?  7:48 PM  ?BMP  ?Glucose 70 - 99 mg/dL 77   139   86    ?BUN 6 - 20 mg/dL 35   24   31    ?Creatinine 0.61 - 1.24 mg/dL 6.63   5.09   5.79    ?Sodium 135 - 145 mmol/L 134   134   140    ?Potassium 3.5 - 5.1 mmol/L 4.3   3.8   3.8    ?Chloride 98 - 111 mmol/L 95   96   98    ?CO2 22 - 32 mmol/L 31   30   30     ?Calcium 8.9 - 10.3 mg/dL 9.4   9.3   10.0    ?  ? ?Sodium ?Normonatremic ? ? ?Potassium ?Normokalemic ? ? ? ?7)Acid base ? ? ? ?Co2 at goal ? ? ? ? ?Plan: ? ?Agree with increasing dose of hydralazine and losartan ?We will change amlodipine to nifedipine ?We will dialyze patient today ? ? ?Jedrick Hutcherson s Theador Hawthorne ?10/10/2021, 5:34 PM  ?

## 2021-10-10 NOTE — TOC Progression Note (Signed)
Transition of Care (TOC) - Progression Note  ? ? ?Patient Details  ?Name: Kaniel Kiang [place] ?MRN: [id number] ?Date of Birth: 07/10/1960 ? ?Transition of Care (TOC) CM/SW Contact  ?Anselm Pancoast, RN ?Phone Number: ?10/10/2021, 4:16 PM ? ?Clinical Narrative:    ?Attempted to contact [facility] @ [place] answer and voicemail full. MD reports guardian gave verbal permission for patient to return. Will need to make contact with [facility] for transportation.  ? ? ?  ?  ? ?Expected Discharge Plan and Services ?  ?  ?  ?  ?  ?                ?  ?  ?  ?  ?  ?  ?  ?  ?  ?  ? ? ?Social Determinants of Health (SDOH) Interventions ?  ? ?Readmission Risk Interventions ?   ? View : No data to display.  ?  ?  ?  ? ? ?

## 2021-10-11 DIAGNOSIS — I160 Hypertensive urgency: Secondary | ICD-10-CM | POA: Diagnosis not present

## 2021-10-11 LAB — BASIC METABOLIC PANEL
Anion gap: 6 (ref 5–15)
BUN: 23 mg/dL — ABNORMAL HIGH (ref 6–20)
CO2: 30 mmol/L (ref 22–32)
Calcium: 9.4 mg/dL (ref 8.9–10.3)
Chloride: 99 mmol/L (ref 98–111)
Creatinine, Ser: 5.11 mg/dL — ABNORMAL HIGH (ref 0.61–1.24)
GFR, Estimated: 12 mL/min — ABNORMAL LOW (ref >60)
Glucose, Bld: 113 mg/dL — ABNORMAL HIGH (ref 70–99)
Potassium: 4.1 mmol/L (ref 3.5–5.1)
Sodium: 135 mmol/L (ref 135–145)

## 2021-10-11 LAB — MAGNESIUM: Magnesium: 2.1 mg/dL (ref 1.7–2.4)

## 2021-10-11 MED ORDER — ISOSORBIDE MONONITRATE ER 60 MG PO TB24
60.0000 mg | ORAL_TABLET | Freq: Every day | ORAL | Status: DC
  Administered 2021-10-11 – 2021-10-16 (×6): 60 mg via ORAL
  Filled 2021-10-11 (×6): qty 1

## 2021-10-11 MED ORDER — ORAL CARE MOUTH RINSE
15.0000 mL | Freq: Two times a day (BID) | OROMUCOSAL | Status: DC
  Administered 2021-10-11 – 2021-10-16 (×11): 15 mL via OROMUCOSAL

## 2021-10-11 NOTE — Progress Notes (Signed)
?PROGRESS NOTE ? ? ? ?Todd Brown  XTG:[id number] DOB: 05/11/1961 DOA: 10/07/2021 ?PCP: Jodi Marble, MD  ? ?Brief Narrative:  ?61 year old with history of HTN, ESRD on HD via left AV fistula, schizophrenia, GERD, muscle spasm came to the ED with complaints of altered mental status and high blood pressure.  Initially patient's systolic blood pressure was greater than 200.  CT of the head was negative.  He was diagnosed with hypertensive urgency started on home medication and briefly on Cardene drip.  Nephrology was consulted for hemodialysis.  PT is recommending SNF. ? ? ?Assessment & Plan: ? Principal Problem: ?  Hypertensive urgency ?Active Problems: ?  Schizophrenia ([place]) ?  Essential hypertension, benign ?  End stage renal disease ([place]) ?  Dialysis patient [facility]) ?  GERD (gastroesophageal reflux disease) ?  Altered mental status ?  ? ? ?Assessment and Plan: ?* Hypertensive urgency ?- BP still remains elevated.  Losartan increased to 100 mg daily, hydralazine changed to 100 mg 3 times daily.   ?D/c'd Norvasc 10 mg daily.  ?5/12 started nifedipine 90 mg p.o. OD by nephro ?5/13 started Imdur 60 mg p.o. daily persistent high blood pressure ? ? ?Altered mental status, appears to be at baseline ?- Patient resides at [facility] [place] [facility].  Ammonia level wnl,  normal B12, elevated ? ?GERD (gastroesophageal reflux disease) ?- PPI ? ?End stage renal disease ([place]) ?- Nephrology consulted for hemodialysis ? ?Essential hypertension, benign ?- I have resumed home antihypertensive medications and as needed coverage ? ?Schizophrenia ([place]) ?- With history of garbled and pressured speech with possible cognitive impairment per psychiatry note on 10/04/2019 ?- Resumed home fluphenazine 2.5 mg daily, trihexyphenidyl 2 mg daily, carbamazepine 1000 mg q. every morning ? ? ? ?DVT prophylaxis: Subcu heparin ?Code Status: Full code ?Family Communication: My colleague spoke with [place] and patient's RN at the group home [facility] [phone] ? ?Status is: Inpatient ?Remains inpatient appropriate because: PT is recommending SNF. ? ? ?Subjective: ?No acute events overnight, patient was sleepy and obtunded, unable to offer any commands but patient was resting comfortably without any acute distress. ? ?Examination: ?Constitutional: Not in acute distress, appears chronically ill.  ?Respiratory: Clear to auscultation bilaterally ?Cardiovascular: Normal sinus rhythm, no rubs ?Abdomen: Nontender nondistended good bowel sounds ?Musculoskeletal: No edema noted ?Skin: No rashes seen ?Neurologic: Nonfocal exam ?Psychiatric: Poor judgment and insight ? ?Objective: ?Vitals:  ? 10/11/21 0547 10/11/21 0749 10/11/21 1238 10/11/21 1408  ?BP: (!) 147/78 (!) 172/88 (!) 156/78 (!) 164/72  ?Pulse: (!) 57 (!) 58 (!) 51 (!) 59  ?Resp: 16 20 18 20   ?Temp: 98.3 ?F (36.8 ?C) 98.2 ?F (36.8 ?C)  97.7 ?F (36.5 ?C)  ?TempSrc: Oral Oral  Oral  ?SpO2: 97% 94% 95% 93%  ?Weight:      ?Height:      ? ? ?Intake/Output Summary (Last 24 hours) at 10/11/2021 1419 ?Last data filed at 10/11/2021 1350 ?Gross per 24 hour  ?Intake 1080 ml  ?Output --  ?Net 1080 ml  ? ?Filed Weights  ? 10/10/21 0400 10/10/21 1433  ?Weight: 73.9 kg 67.7 kg  ? ? ? ?Data Reviewed:  ? ?CBC: ?Recent Labs  ?Lab 10/07/21 ?1948  ?WBC 4.2  ?NEUTROABS 2.5  ?HGB 10.4*  ?HCT 31.0*  ?MCV 98.4  ?PLT 188  ? ?Basic Metabolic Panel: ?Recent Labs  ?Lab 10/07/21 ?1948 10/09/21 ?6789 10/10/21 ?3810 10/11/21 ?1751  ?NA 140 134* 134* 135  ?K 3.8 3.8 4.3 4.1  ?  CL 98 96* 95* 99  ?CO2 30 30 31 30   ?GLUCOSE 86 139* 77 113*  ?BUN 31* 24* 35* 23*  ?CREATININE 5.79* 5.09* 6.63* 5.11*  ?CALCIUM 10.0 9.3 9.4 9.4  ?MG  --  1.9 1.9 2.1  ? ?GFR: ?Estimated Creatinine Clearance: 14.7 mL/min (A) (by C-G formula based on SCr of 5.11 mg/dL (H)). ?Liver Function Tests: ?Recent Labs  ?Lab 10/07/21 ?1948  ?AST 25  ?ALT 13  ?ALKPHOS 51  ?BILITOT 0.8  ?PROT 6.9  ?ALBUMIN 3.6  ? ?No results for input(s): LIPASE, AMYLASE in the last 168  hours. ?Recent Labs  ?Lab 10/08/21 ?0023  ?AMMONIA 27  ? ?Coagulation Profile: ?No results for input(s): INR, PROTIME in the last 168 hours. ?Cardiac Enzymes: ?No results for input(s): CKTOTAL, CKMB, CKMBINDEX, TROPONINI in the last 168 hours. ?BNP (last 3 results) ?No results for input(s): PROBNP in the last 8760 hours. ?HbA1C: ?No results for input(s): HGBA1C in the last 72 hours. ?CBG: ?Recent Labs  ?Lab 10/09/21 ?1216 10/09/21 ?1646 10/09/21 ?2014 10/09/21 ?2328 10/09/21 ?2332  ?GLUCAP 112* 88 97 94 92  ? ?Lipid Profile: ?No results for input(s): CHOL, HDL, LDLCALC, TRIG, CHOLHDL, LDLDIRECT in the last 72 hours. ?Thyroid Function Tests: ?No results for input(s): TSH, T4TOTAL, FREET4, T3FREE, THYROIDAB in the last 72 hours. ?Anemia Panel: ?No results for input(s): VITAMINB12, FOLATE, FERRITIN, TIBC, IRON, RETICCTPCT in the last 72 hours. ? ?Sepsis Labs: ?No results for input(s): PROCALCITON, LATICACIDVEN in the last 168 hours. ? ?Recent Results (from the past 240 hour(s))  ?Resp Panel by RT-PCR (Flu A&B, Covid) Nasopharyngeal Swab     Status: None  ? Collection Time: 10/07/21  7:48 PM  ? Specimen: Nasopharyngeal Swab; Nasopharyngeal(NP) swabs in vial transport medium  ?Result Value Ref Range Status  ? SARS Coronavirus 2 by RT PCR NEGATIVE NEGATIVE Final  ?  Comment: (NOTE) ?SARS-CoV-2 target nucleic acids are NOT DETECTED. ? ?The SARS-CoV-2 RNA is generally detectable in upper respiratory ?specimens during the acute phase of infection. The lowest ?concentration of SARS-CoV-2 viral copies this assay can detect is ?138 copies/mL. A negative result does not preclude SARS-Cov-2 ?infection and should not be used as the sole basis for treatment or ?other patient management decisions. A negative result may occur with  ?improper specimen collection/handling, submission of specimen other ?than nasopharyngeal swab, presence of viral mutation(s) within the ?areas targeted by this assay, and inadequate number of  viral ?copies(<138 copies/mL). A negative result must be combined with ?clinical observations, patient history, and epidemiological ?information. The expected result is Negative. ? ?Fact Sheet for Patients:  ?EntrepreneurPulse.com.au ? ?Fact Sheet for Healthcare Providers:  ?IncredibleEmployment.be ? ?This test is no t yet approved or cleared by the [place] FDA and  ?has been authorized for detection and/or diagnosis of SARS-CoV-2 by ?FDA under an Emergency Use Authorization (EUA). This EUA will remain  ?in effect (meaning this test can be used) for the duration of the ?COVID-19 declaration under Section 564(b)(1) of the Act, 21 ?U.S.C.section 360bbb-3(b)(1), unless the authorization is terminated  ?or revoked sooner.  ? ? ?  ? Influenza A by PCR NEGATIVE NEGATIVE Final  ? Influenza B by PCR NEGATIVE NEGATIVE Final  ?  Comment: (NOTE) ?The Xpert Xpress SARS-CoV-2/FLU/RSV plus assay is intended as an aid ?in the diagnosis of influenza from Nasopharyngeal swab specimens and ?should not be used as a sole basis for treatment. Nasal washings and ?aspirates are unacceptable for Xpert Xpress SARS-CoV-2/FLU/RSV ?testing. ? ?Fact Sheet for Patients: ?EntrepreneurPulse.com.au ? ?  Fact Sheet for Healthcare Providers: ?IncredibleEmployment.be ? ?This test is not yet approved or cleared by the [place] FDA and ?has been authorized for detection and/or diagnosis of SARS-CoV-2 by ?FDA under an Emergency Use Authorization (EUA). This EUA will remain ?in effect (meaning this test can be used) for the duration of the ?COVID-19 declaration under Section 564(b)(1) of the Act, 21 U.S.C. ?section 360bbb-3(b)(1), unless the authorization is terminated or ?revoked. ? ?Performed at [facility], [place], ?[place] [address] ?  ?  ? ? ? ? ? ?Radiology Studies: ?No results found. ? ? ? ? ? ?Scheduled Meds: ? calcium acetate  1,334 mg Oral  TID WC  ? carbamazepine  1,000 mg Oral q AM  ? Chlorhexidine Gluconate Cloth  6 each Topical Q0600  ? cholecalciferol  1,000 Units Oral Daily  ? cloNIDine  0.3 mg Oral TID  ? epoetin (EPOGEN/PROCRIT) injection  3,00

## 2021-10-11 NOTE — Progress Notes (Signed)
[place] ? ?MRN: [id number] ? ?DOB/AGE: May 14, 1961 61 y.o. ? ?Primary Care Physician:Tejan-Sie, Brandt Loosen, MD ? ?Admit date: 10/07/2021 ? ?Chief Complaint:  ?Chief Complaint  ?Patient presents with  ? Hypertension  ? Altered Mental Status  ? ? ?S-Pt presented on  10/07/2021 with  ?Chief Complaint  ?Patient presents with  ? Hypertension  ? Altered Mental Status  ?. ?Todd Brown is a 61 year old male with history of hypertension, ESRD on HD via left AV fistula, schizophrenia, GERD, muscle spasms, who presents emergency department for chief concerns of altered mental status and high blood pressure. ? ?Nephrology was consulted for comanagement of dialysis patient ? ?Patient was seen today on second floor ?Patient offers no new specific physical complaints  ? ? ?Medication  ? calcium acetate  1,334 mg Oral TID WC  ? carbamazepine  1,000 mg Oral q AM  ? Chlorhexidine Gluconate Cloth  6 each Topical Q0600  ? cholecalciferol  1,000 Units Oral Daily  ? cloNIDine  0.3 mg Oral TID  ? epoetin (EPOGEN/PROCRIT) injection  3,000 Units Intravenous Q M,W,F-HD  ? ferrous sulfate  325 mg Oral Daily  ? fluPHENAZine  2.5 mg Oral Daily  ? heparin  5,000 Units Subcutaneous Q8H  ? hydrALAZINE  100 mg Oral Q8H  ? losartan  100 mg Oral Daily  ? mouth rinse  15 mL Mouth Rinse BID  ? mometasone-formoterol  2 puff Inhalation BID  ? multivitamin  1 tablet Oral QHS  ? NIFEdipine  90 mg Oral Daily  ? pantoprazole  80 mg Oral Daily  ? trihexyphenidyl  2 mg Oral BID  ? ? ? ? ? ? ? ?ROS: Unable to get any data ? ? ?Physical Exam: ?Vital signs in last 24 hours: ?Temp:  [98.1 ?F (36.7 ?C)-98.7 ?F (37.1 ?C)] 98.2 ?F (36.8 ?C) (05/13 0749) ?Pulse Rate:  [51-64] 51 (05/13 1238) ?Resp:  [4-20] 18 (05/13 1238) ?BP: (147-193)/(78-100) 156/78 (05/13 1238) ?SpO2:  [94 %-100 %] 95 % (05/13 1238) ?Weight:  [67.7 kg] 67.7 kg (05/12 1433) ?Weight change: -6.2 kg ?Last BM Date :  (PTA) ? ?Intake/Output from previous day: ?05/12 0701 - 05/13 0700 ?In: 360  [P.O.:360] ?Out: 2041  ?No intake/output data recorded. ? ? ?Physical Exam: ? ?General- pt is alert, and responds more than yesterady  ? ?Resp- No acute REsp distress, CTA B/L NO Rhonchi ? ?CVS- S1S2 regular in rate and rhythm ? ?GIT- BS+, soft, Non tender , Non distended ? ?EXT- No LE Edema,  No Cyanosis ? ?Access- LUE  AVF-aneurysm present ?             Permacath in situ, patient catheter is being used ? ?Lab Results: ? ?CBC ? ?No results for input(s): WBC, HGB, HCT, PLT in the last 72 hours. ? ? ?BMET ? ?Recent Labs  ?  10/10/21 ?0613 10/11/21 ?3614  ?NA 134* 135  ?K 4.3 4.1  ?CL 95* 99  ?CO2 31 30  ?GLUCOSE 77 113*  ?BUN 35* 23*  ?CREATININE 6.63* 5.11*  ?CALCIUM 9.4 9.4  ? ? ? ? ?Most recent Creatinine trend  ?Lab Results  ?Component Value Date  ? CREATININE 5.11 (H) 10/11/2021  ? CREATININE 6.63 (H) 10/10/2021  ? CREATININE 5.09 (H) 10/09/2021  ?  ? ? ?MICRO ? ? ?Recent Results (from the past 240 hour(s))  ?Resp Panel by RT-PCR (Flu A&B, Covid) Nasopharyngeal Swab     Status: None  ? Collection Time: 10/07/21  7:48 PM  ? Specimen:  Nasopharyngeal Swab; Nasopharyngeal(NP) swabs in vial transport medium  ?Result Value Ref Range Status  ? SARS Coronavirus 2 by RT PCR NEGATIVE NEGATIVE Final  ?  Comment: (NOTE) ?SARS-CoV-2 target nucleic acids are NOT DETECTED. ? ?The SARS-CoV-2 RNA is generally detectable in upper respiratory ?specimens during the acute phase of infection. The lowest ?concentration of SARS-CoV-2 viral copies this assay can detect is ?138 copies/mL. A negative result does not preclude SARS-Cov-2 ?infection and should not be used as the sole basis for treatment or ?other patient management decisions. A negative result may occur with  ?improper specimen collection/handling, submission of specimen other ?than nasopharyngeal swab, presence of viral mutation(s) within the ?areas targeted by this assay, and inadequate number of viral ?copies(<138 copies/mL). A negative result must be combined  with ?clinical observations, patient history, and epidemiological ?information. The expected result is Negative. ? ?Fact Sheet for Patients:  ?EntrepreneurPulse.com.au ? ?Fact Sheet for Healthcare Providers:  ?IncredibleEmployment.be ? ?This test is no t yet approved or cleared by the [place] FDA and  ?has been authorized for detection and/or diagnosis of SARS-CoV-2 by ?FDA under an Emergency Use Authorization (EUA). This EUA will remain  ?in effect (meaning this test can be used) for the duration of the ?COVID-19 declaration under Section 564(b)(1) of the Act, 21 ?U.S.C.section 360bbb-3(b)(1), unless the authorization is terminated  ?or revoked sooner.  ? ? ?  ? Influenza A by PCR NEGATIVE NEGATIVE Final  ? Influenza B by PCR NEGATIVE NEGATIVE Final  ?  Comment: (NOTE) ?The Xpert Xpress SARS-CoV-2/FLU/RSV plus assay is intended as an aid ?in the diagnosis of influenza from Nasopharyngeal swab specimens and ?should not be used as a sole basis for treatment. Nasal washings and ?aspirates are unacceptable for Xpert Xpress SARS-CoV-2/FLU/RSV ?testing. ? ?Fact Sheet for Patients: ?EntrepreneurPulse.com.au ? ?Fact Sheet for Healthcare Providers: ?IncredibleEmployment.be ? ?This test is not yet approved or cleared by the [place] FDA and ?has been authorized for detection and/or diagnosis of SARS-CoV-2 by ?FDA under an Emergency Use Authorization (EUA). This EUA will remain ?in effect (meaning this test can be used) for the duration of the ?COVID-19 declaration under Section 564(b)(1) of the Act, 21 U.S.C. ?section 360bbb-3(b)(1), unless the authorization is terminated or ?revoked. ? ?Performed at [facility], [place], ?[place] [address] ?  ?  ? ? ? ? ? ?Impression: ? ? ?1)Renal   ? ?ESRD on hemodialysis ?Patient is on Monday Wednesday Friday schedule. ?As an outpatient patient is under care for [facility] and get dialyzed  at [facility] on [address] on Monday Wednesday Friday schedule. ?No need for renal replacement therapy today ? ? ?2) hypertensive urgency ? ?Patient antihypertensive regimen was adjusted yesterday we will follow-up today and then decide if we need to change it again ? ? ?3)Anemia of chronic disease ? ? ?  Latest Ref Rng & Units 10/07/2021  ?  7:48 PM 02/18/2021  ? 12:51 PM 02/14/2021  ?  8:58 AM  ?CBC  ?WBC 4.0 - 10.5 K/uL 4.2   7.2     ?Hemoglobin 13.0 - 17.0 g/dL 10.4   8.8   9.5    ?Hematocrit 39.0 - 52.0 % 31.0   25.3   28.0    ?Platelets 150 - 400 K/uL 188   151     ?  ? ? ? HGb at goal (9--11) ? ? ?4) Secondary hyperparathyroidism -CKD Mineral-Bone Disorder ? ? ? ?Lab Results  ?Component Value Date  ? PTH 194 (  H) 05/11/2017  ? CALCIUM 9.4 10/11/2021  ? CAION 1.17 02/14/2021  ? PHOS 3.2 10/20/2019  ? ? ?Secondary Hyperparathyroidism present ? ?Phosphorus at goal. ? ? ?5) altered mental status ?Patient is clinically better ?Patient being followed by the primary team ? ?6) Electrolytes  ? ? ?  Latest Ref Rng & Units 10/11/2021  ?  5:20 AM 10/10/2021  ?  6:13 AM 10/09/2021  ?  6:48 AM  ?BMP  ?Glucose 70 - 99 mg/dL 113   77   139    ?BUN 6 - 20 mg/dL 23   35   24    ?Creatinine 0.61 - 1.24 mg/dL 5.11   6.63   5.09    ?Sodium 135 - 145 mmol/L 135   134   134    ?Potassium 3.5 - 5.1 mmol/L 4.1   4.3   3.8    ?Chloride 98 - 111 mmol/L 99   95   96    ?CO2 22 - 32 mmol/L 30   31   30     ?Calcium 8.9 - 10.3 mg/dL 9.4   9.4   9.3    ?  ? ?Sodium ?Normonatremic ? ? ?Potassium ?Normokalemic ? ? ? ?7)Acid base ? ? ? ?Co2 at goal ? ? ? ? ?Plan: ? ?No need for renal replacement therapy today ? ? ?Todd Brown s Theador Hawthorne ?10/11/2021, 1:22 PM  ?

## 2021-10-11 NOTE — Evaluation (Signed)
Occupational Therapy Evaluation ?Patient Details ?Name: Todd Brown ?MRN: [id number] ?DOB: 04/10/1961 ?Today's Date: 10/11/2021 ? ? ?History of Present Illness Pt is a 61 year old male with history of hypertension, ESRD on HD via left AV fistula, schizophrenia, GERD, muscle spasms, who presents emergency department for chief concerns of altered mental status and high blood pressure. MD assessment includes: Hypertensive urgency and AMS.  ? ?Clinical Impression ?  ?Chart reviewed, pt greeted in room. Pt aroused to noise however eyes briefly opened but remained shut throughout evaluation with pt following 1 step directions approx 50% of the time. MAX A +2 required for supine<>sit, STS with MAX A +2 to change bed linens. Pt required MAX A for UB dressing and for washing face. Poor attention to task throughout. Per chart review pt required some assist at baseline for ADLs performed SPT with SBA-CGA. At this time pt is performing all ADL/mobility below PLOF, would benefit from STR to address deficits. OT will continue to follow acutely.   ? ?Recommendations for follow up therapy are one component of a multi-disciplinary discharge planning process, led by the attending physician.  Recommendations may be updated based on patient status, additional functional criteria and insurance authorization.  ? ?Follow Up Recommendations ? Skilled nursing-short term rehab (<3 hours/day)  ?  ?Assistance Recommended at Discharge Frequent or constant Supervision/Assistance  ?Patient can return home with the following A lot of help with walking and/or transfers;A lot of help with bathing/dressing/bathroom;Help with stairs or ramp for entrance;Direct supervision/assist for medications management;Assistance with cooking/housework;Assist for transportation;Direct supervision/assist for financial management ? ?  ?Functional Status Assessment ? Patient has had a recent decline in their functional status and demonstrates the ability to make  significant improvements in function in a reasonable and predictable amount of time.  ?Equipment Recommendations ? Other (comment) (per next venue of care)  ?  ?Recommendations for Other Services   ? ? ?  ?Precautions / Restrictions Precautions ?Precautions: Fall ?Precaution Comments: LUE fistula  ? ?  ? ?Mobility Bed Mobility ?Overal bed mobility: Needs Assistance ?Bed Mobility: Supine to Sit, Sit to Supine ?  ?  ?Supine to sit: Max assist, HOB elevated, +2 for physical assistance ?Sit to supine: Max assist, HOB elevated, +2 for physical assistance ?  ?  ?  ? ?Transfers ?Overall transfer level: Needs assistance ?Equipment used: None ?Transfers: Sit to/from Stand ?Sit to Stand: Max assist, +2 physical assistance ?  ?  ?  ?  ?  ?General transfer comment: Pt unable to safely attend to task for safe transfer to bedside chair on this date ?  ? ?  ?Balance Overall balance assessment: Needs assistance ?Sitting-balance support: Bilateral upper extremity supported, Feet supported ?Sitting balance-[name]: Poor ?  ?Postural control: Posterior lean, Left lateral lean ?Standing balance support: No upper extremity supported ?Standing balance-[name]: Poor ?  ?  ?  ?  ?  ?  ?  ?  ?  ?  ?  ?  ?   ? ?ADL either performed or assessed with clinical judgement  ? ?ADL Overall ADL's : Needs assistance/impaired ?Eating/Feeding: Maximal assistance;Bed level ?  ?Grooming: Wash/dry face;Sitting;Maximal assistance ?  ?  ?  ?  ?  ?Upper Body Dressing : Maximal assistance ?  ?Lower Body Dressing: Maximal assistance ?  ?  ?  ?  ?  ?  ?  ?  ?   ? ? ? ?Vision   ?Additional Comments: will continue to assess  ?   ?Perception   ?  ?  Praxis   ?  ? ?Pertinent Vitals/Pain Pain Assessment ?Pain Assessment: Faces ?Faces Pain Scale: No hurt  ? ? ? ?Hand Dominance   ?  ?Extremity/Trunk Assessment Upper Extremity Assessment ?Upper Extremity Assessment: Generalized weakness ?  ?Lower Extremity Assessment ?Lower Extremity Assessment: Generalized  weakness ?  ?  ?  ?Communication Communication ?Communication: Expressive difficulties ?  ?Cognition Arousal/Alertness: Lethargic ?Behavior During Therapy: Flat affect ?Overall Cognitive Status: No family/caregiver present to determine baseline cognitive functioning ?  ?  ?  ?  ?  ?  ?  ?  ?  ?  ?  ?  ?  ?  ?  ?  ?General Comments: Pt opens eyes to name, is not oriented to date, place, situation. Follows 1 step directions approx 50% of the time. ?  ?  ?General Comments  Pt vital signs taken throughout and appear WNL ? ?  ?Exercises   ?  ?Shoulder Instructions    ? ? ?Home Living Family/patient expects to be discharged to:: Assisted living ?  ?  ?  ?  ?  ?  ?  ?  ?  ?  ?  ?  ?  ?  ?Home Equipment: Wheelchair - manual ?  ?  ?  ? ?  ?Prior Functioning/Environment Prior Level of Function : Needs assist ?  ?  ?  ?  ?  ?  ?Mobility Comments: Per chart review pt SPT to bed/mwc with CGA, no fall history, assist for mwc mobility ?ADLs Comments: Per chart review assist from ALF staff for ADLs ?  ? ?  ?  ?OT Problem List: Decreased strength;Decreased activity tolerance;Impaired balance (sitting and/or standing);Decreased knowledge of use of DME or AE;Decreased cognition ?  ?   ?OT Treatment/Interventions: Self-care/ADL training;Therapeutic exercise;Patient/family education;Balance training;Therapeutic activities;DME and/or AE instruction;Cognitive remediation/compensation  ?  ?OT Goals(Current goals can be found in the care plan section) Acute Rehab OT Goals ?OT Goal Formulation: Patient unable to participate in goal setting ?ADL Goals ?Pt Will Perform Grooming: with min assist ?Pt Will Perform Upper Body Dressing: with min assist;sitting ?Pt Will Perform Lower Body Dressing: with mod assist;sit to/from stand ?Pt Will Transfer to Toilet: with +2 assist;stand pivot transfer;with min assist  ?OT Frequency: Min 2X/week ?  ? ?Co-evaluation   ?  ?  ?  ?  ? ?  ?AM-PAC OT "6 Clicks" Daily Activity     ?Outcome Measure Help from  another person eating meals?: A Lot ?Help from another person taking care of personal grooming?: A Lot ?Help from another person toileting, which includes using toliet, bedpan, or urinal?: Total ?Help from another person bathing (including washing, rinsing, drying)?: A Lot ?Help from another person to put on and taking off regular upper body clothing?: A Lot ?Help from another person to put on and taking off regular lower body clothing?: A Lot ?6 Click Score: 11 ?  ?End of Session Nurse Communication: Mobility status (pt lethargy) ? ?Activity Tolerance: Patient limited by lethargy ?Patient left: in bed;with call bell/phone within reach;with bed alarm set ? ?OT Visit Diagnosis: Muscle weakness (generalized) (M62.81);Unsteadiness on feet (R26.81)  ?              ?Time: 3500-9381 ?OT Time Calculation (min): 21 min ?Charges:  OT General Charges ?$OT Visit: 1 Visit ?OT Evaluation ?$OT Eval Moderate Complexity: 1 Mod ? ?Shanon Payor, OTD OTR/L  ?10/11/21, 1:04 PM  ?

## 2021-10-11 NOTE — Progress Notes (Signed)
PT Cancellation Note ? ?Patient Details ?Name: Todd Brown ?MRN: [id number] ?DOB: February 03, 1961 ? ? ?Cancelled Treatment:     PT attempt. Pt extremely lethargic. Maintained eyes closed through time in room. Author assisted OT with transfer to change bed linens due to being soiled with food from previous night. Pt unable to fully participate at this time. Acute PT will continue efforts as able per current POC.  ? ? ?Willette Pa ?10/11/2021, 11:50 AM ?

## 2021-10-12 DIAGNOSIS — I160 Hypertensive urgency: Secondary | ICD-10-CM | POA: Diagnosis not present

## 2021-10-12 LAB — BASIC METABOLIC PANEL
Anion gap: 12 (ref 5–15)
BUN: 35 mg/dL — ABNORMAL HIGH (ref 6–20)
CO2: 29 mmol/L (ref 22–32)
Calcium: 9.3 mg/dL (ref 8.9–10.3)
Chloride: 90 mmol/L — ABNORMAL LOW (ref 98–111)
Creatinine, Ser: 6.45 mg/dL — ABNORMAL HIGH (ref 0.61–1.24)
GFR, Estimated: 9 mL/min — ABNORMAL LOW (ref >60)
Glucose, Bld: 87 mg/dL (ref 70–99)
Potassium: 4.7 mmol/L (ref 3.5–5.1)
Sodium: 131 mmol/L — ABNORMAL LOW (ref 135–145)

## 2021-10-12 LAB — MAGNESIUM: Magnesium: 2.1 mg/dL (ref 1.7–2.4)

## 2021-10-12 LAB — PHOSPHORUS: Phosphorus: 3.2 mg/dL (ref 2.5–4.6)

## 2021-10-12 NOTE — Evaluation (Signed)
Clinical/Bedside Swallow Evaluation ?Patient Details  ?Name: Todd Brown ?MRN: [id number] ?Date of Birth: 21-Dec-1960 ? ?Today's Date: 10/12/2021 ?Time: SLP Start Time (ACUTE ONLY): 1050 SLP Stop Time (ACUTE ONLY): 2025 ?SLP Time Calculation (min) (ACUTE ONLY): 13 min ? ?Past Medical History:  ?Past Medical History:  ?Diagnosis Date  ? Anemia   ? Dialysis patient Todd Brown)   ? Mon. -Wed.- Fri  ? DVT (deep venous thrombosis) ([place])   ? cephalic and basolic vein thrombosis  ? ESRD (end stage renal disease) ([place])   ? Hyperlipidemia   ? Malignant hypertension   ? Pneumonia 2016  ? Renal artery stenosis ([place])   ? Schizophrenia ([place])   ? ?Past Surgical History:  ?Past Surgical History:  ?Procedure Laterality Date  ? A/V FISTULAGRAM N/A 08/06/2016  ? Procedure: A/V Fistulagram;  Surgeon: Todd Huxley, MD;  Location: [place] [facility];  Service: Cardiovascular;  Laterality: N/A;  ? A/V FISTULAGRAM N/A 02/14/2021  ? Procedure: A/V FISTULAGRAM poss Perm Cath Insertion;  Surgeon: Todd Cabal, MD;  Location: [place] [facility];  Service: Cardiovascular;  Laterality: N/A;  ? A/V FISTULAGRAM Left 08/26/2021  ? Procedure: A/V Fistulagram;  Surgeon: Todd Huxley, MD;  Location: [place];  Service: Cardiovascular;  Laterality: Left;  ? A/V SHUNT INTERVENTION N/A 08/06/2016  ? Procedure: A/V Shunt Intervention;  Surgeon: Todd Huxley, MD;  Location: [facility];  Service: Cardiovascular;  Laterality: N/A;  ? AV FISTULA PLACEMENT Left 12/26/2014  ? Procedure: ARTERIOVENOUS (AV) FISTULA CREATION;  Surgeon: Todd Huxley, MD;  Location: [facility];  Service: Vascular;  Laterality: Left;  ? ESOPHAGOGASTRODUODENOSCOPY (EGD) WITH PROPOFOL N/A 05/06/2017  ? Procedure: ESOPHAGOGASTRODUODENOSCOPY (EGD) WITH PROPOFOL;  Surgeon: Todd Bellows, MD;  Location: [facility];  Service: Gastroenterology;  Laterality: N/A;  ? INSERTION OF DIALYSIS CATHETER Right   ? PERIPHERAL VASCULAR CATHETERIZATION N/A 10/22/2014  ? Procedure:  Dialysis/Perma Catheter Insertion;  Surgeon: Todd Huxley, MD;  Location: [place] [facility] [facility];  Service: Cardiovascular;  Laterality: N/A;  ? PERIPHERAL VASCULAR CATHETERIZATION N/A 02/28/2015  ? Procedure: Dialysis/Perma Catheter Removal;  Surgeon: Todd Huxley, MD;  Location: [place] [facility];  Service: Cardiovascular;  Laterality: N/A;  ? Repair fx left lower leg    ? yrs ago (age 71)  ? TONSILLECTOMY    ? ?HPI:  ?Per H&P "Todd Brown is a 61 year old male with history of hypertension, ESRD on HD via left AV fistula, schizophrenia, GERD, muscle spasms, who presents emergency department for chief concerns of altered mental status and high blood pressure.     Initial vitals in ED showed temperature of 98.1, respiration rate of 15, heart 77, initial blood pressure 123 over 96 and increased to 204/87, SPO2 of 93% on room air.    Labs is remarkable for BUN of 31, serum creatinine 5.79, GFR of 10, high sensitive troponin is 29, and decreased to 25.     WBC was not elevated at 4.2.  Hemoglobin 10.4, platelets of 188.     CT of the head without contrast was read as no acute intracranial findings seen in the noncontrast CT of the brain. Atrophy. Small vessel disease.     ED treatment: Hydralazine 5 mg IV one-time dose ordered     At bedside patient has stuttering, articulation error and per EDP this is baseline.  He was able to tell me his name and his age though this was very difficult for me to understand.  Per nursing staff, he had a pack of cigarettes in his socks that were removed.     He was not able to answer if he was hurting anywhere, alcohol use, drug use, with the specific questions he repeated it after me without answering the questions.     Social history: Patient is from [facility].  He endorses tobacco use.     Vaccination history: Unknown     ROS: Unable to complete due to garbled and difficult to comprehend speech."  ?  ?Assessment / Plan / Recommendation  ?Clinical  Impression ? Pt seen for clinical swallowing evaluation. Pt alert, confused. Required encouragement for participation. ? ?Pt known to SLP services from previous admission. Seen by SLP on 10/19/19 with recommendation for mech soft diet with thin liquids with aspiration precaution and reflux precautions (see note for details). Pt unable to provide additional details of PLOF for swallowing. Per RN, pt with good oral intake on Dysphagia 2 Diet; however, significant prolonged/inefficient mastication. ? ?Pt given trials of solid, pureed, and thin liquids (via straw). Pt presents with a moderate oral dysphagia c/b oral holding/inability to masticate solids with eventual expectoration of solid. Oral phase was functional with trials of applesauce and thin liquids. Pharyngeal phase appeared functional per clinical assessment. No overt or subtle s/sx pharyngeal dysphagia across trials. Pt unable to feed self despite assistance. ? ?Recommend diet downgrade to pureed diet with thin liquids and safe swallowing strategies/aspiration precautions as outlined below.  ? ?Pt is at increased risk for aspiration/aspiration PNA given dental status, mental status, and need for assistance with feeding. ? ?SLP to f/u per POC for diet tolerance and trials of upgraded textures as appropriate.  ? ?Pt and RN made aware of results, recommendations, and SLP POC. ?full understanding by pt given AMS.  ? ?SLP Visit Diagnosis: Dysphagia, oral phase (R13.11) ?   ?Aspiration Risk ? Mild aspiration risk  ?  ?Diet Recommendation Dysphagia 1 (Puree);Thin liquid  ? ?Medication Administration: Crushed with puree ?Supervision: Staff to assist with self feeding;Full supervision/cueing for compensatory strategies ?Compensations: Minimize environmental distractions;Slow rate;Small sips/bites ?Postural Changes: Seated upright at 90 degrees;Remain upright for at least 30 minutes after po intake  ?  ?Other  Recommendations Oral Care Recommendations: Oral care  BID;Staff/trained caregiver to provide oral care   ? ?Recommendations for follow up therapy are one component of a multi-disciplinary discharge planning process, led by the attending physician.  Recommendations may be updated based on patient status, additional functional criteria and insurance authorization. ? ?Follow up Recommendations Skilled nursing-short term rehab (<3 hours/day)  ? ? ?  ?Assistance Recommended at Discharge Frequent or constant Supervision/Assistance  ?Functional Status Assessment Patient has had a recent decline in their functional status and demonstrates the ability to make significant improvements in function in a reasonable and predictable amount of time.  ?Frequency and Duration min 2x/week  ?2 weeks ?  ?   ? ?Prognosis Prognosis for Safe Diet Advancement: Fair ?Barriers to Reach Goals: Cognitive deficits;Severity of deficits;Behavior  ? ?  ? ?Swallow Study   ?General Date of Onset: 10/12/21 ?HPI: Per H&P "Mr. Mounir Skipper is a 61 year old male with history of hypertension, ESRD on HD via left AV fistula, schizophrenia, GERD, muscle spasms, who presents emergency department for chief concerns of altered mental status and high blood pressure.     Initial vitals in ED showed temperature of 98.1, respiration rate of 15, heart 77, initial blood pressure 123 over 96 and increased to 204/87, SPO2  of 93% on room air.    Labs is remarkable for BUN of 31, serum creatinine 5.79, GFR of 10, high sensitive troponin is 29, and decreased to 25.     WBC was not elevated at 4.2.  Hemoglobin 10.4, platelets of 188.     CT of the head without contrast was read as no acute intracranial findings seen in the noncontrast CT of the brain. Atrophy. Small vessel disease.     ED treatment: Hydralazine 5 mg IV one-time dose ordered     At bedside patient has stuttering, articulation error and per EDP this is baseline.  He was able to tell me his name and his age though this was very difficult for me to understand.   Per nursing staff, he had a pack of cigarettes in his socks that were removed.     He was not able to answer if he was hurting anywhere, alcohol use, drug use, with the specific questions he repeated it after me wi

## 2021-10-12 NOTE — Progress Notes (Signed)
[place] ? ?MRN: [id number] ? ?DOB/AGE: 1960/11/18 61 y.o. ? ?Primary Care Physician:Tejan-Sie, Brandt Loosen, MD ? ?Admit date: 10/07/2021 ? ?Chief Complaint:  ?Chief Complaint  ?Patient presents with  ? Hypertension  ? Altered Mental Status  ? ? ?S-Pt presented on  10/07/2021 with  ?Chief Complaint  ?Patient presents with  ? Hypertension  ? Altered Mental Status  ?. ?Todd Brown is a 61 year old male with history of hypertension, ESRD on HD via left AV fistula, schizophrenia, GERD, muscle spasms, who presents emergency department for chief concerns of altered mental status and high blood pressure. ? ?Nephrology was consulted for comanagement of dialysis patient ? ?Patient was seen today on second floor ?Patient offers no new specific physical complaints  ? ? ?Medication  ? calcium acetate  1,334 mg Oral TID WC  ? carbamazepine  1,000 mg Oral q AM  ? Chlorhexidine Gluconate Cloth  6 each Topical Q0600  ? cholecalciferol  1,000 Units Oral Daily  ? cloNIDine  0.3 mg Oral TID  ? epoetin (EPOGEN/PROCRIT) injection  3,000 Units Intravenous Q M,W,F-HD  ? ferrous sulfate  325 mg Oral Daily  ? fluPHENAZine  2.5 mg Oral Daily  ? heparin  5,000 Units Subcutaneous Q8H  ? hydrALAZINE  100 mg Oral Q8H  ? isosorbide mononitrate  60 mg Oral Daily  ? losartan  100 mg Oral Daily  ? mouth rinse  15 mL Mouth Rinse BID  ? mometasone-formoterol  2 puff Inhalation BID  ? multivitamin  1 tablet Oral QHS  ? NIFEdipine  90 mg Oral Daily  ? pantoprazole  80 mg Oral Daily  ? trihexyphenidyl  2 mg Oral BID  ? ? ? ? ? ? ? ?ROS: Patient more alert than yesterday, patient offers no specific complaints ? ? ?Physical Exam: ?Vital signs in last 24 hours: ?Temp:  [97.1 ?F (36.2 ?C)-97.9 ?F (36.6 ?C)] 97.9 ?F (36.6 ?C) (05/14 1531) ?Pulse Rate:  [57-64] 60 (05/14 1531) ?Resp:  [16-19] 16 (05/14 0741) ?BP: (99-199)/(72-89) 114/75 (05/14 1531) ?SpO2:  [93 %-100 %] 100 % (05/14 1531) ?Weight change:  ?Last BM Date :  (Pta) ? ?Intake/Output from  previous day: ?05/13 0701 - 05/14 0700 ?In: 1440 [P.O.:1440] ?Out: -  ?Total I/O ?In: 600 [P.O.:600] ?Out: -  ? ? ?Physical Exam: ? ?General- pt is alert, and responds more than yesterady  ? ?Resp- No acute REsp distress, CTA B/L NO Rhonchi ? ?CVS- S1S2 regular in rate and rhythm ? ?GIT- BS+, soft, Non tender , Non distended ? ?EXT- No LE Edema,  No Cyanosis ? ?Access- LUE  AVF-aneurysm present ?             Permacath in situ, patient catheter is being used ? ?Lab Results: ? ?CBC ? ?No results for input(s): WBC, HGB, HCT, PLT in the last 72 hours. ? ? ?BMET ? ?Recent Labs  ?  10/11/21 ?3557 10/12/21 ?0453  ?NA 135 131*  ?K 4.1 4.7  ?CL 99 90*  ?CO2 30 29  ?GLUCOSE 113* 87  ?BUN 23* 35*  ?CREATININE 5.11* 6.45*  ?CALCIUM 9.4 9.3  ? ? ? ? ?Most recent Creatinine trend  ?Lab Results  ?Component Value Date  ? CREATININE 6.45 (H) 10/12/2021  ? CREATININE 5.11 (H) 10/11/2021  ? CREATININE 6.63 (H) 10/10/2021  ?  ? ? ?MICRO ? ? ?Recent Results (from the past 240 hour(s))  ?Resp Panel by RT-PCR (Flu A&B, Covid) Nasopharyngeal Swab     Status: None  ?  Collection Time: 10/07/21  7:48 PM  ? Specimen: Nasopharyngeal Swab; Nasopharyngeal(NP) swabs in vial transport medium  ?Result Value Ref Range Status  ? SARS Coronavirus 2 by RT PCR NEGATIVE NEGATIVE Final  ?  Comment: (NOTE) ?SARS-CoV-2 target nucleic acids are NOT DETECTED. ? ?The SARS-CoV-2 RNA is generally detectable in upper respiratory ?specimens during the acute phase of infection. The lowest ?concentration of SARS-CoV-2 viral copies this assay can detect is ?138 copies/mL. A negative result does not preclude SARS-Cov-2 ?infection and should not be used as the sole basis for treatment or ?other patient management decisions. A negative result may occur with  ?improper specimen collection/handling, submission of specimen other ?than nasopharyngeal swab, presence of viral mutation(s) within the ?areas targeted by this assay, and inadequate number of viral ?copies(<138  copies/mL). A negative result must be combined with ?clinical observations, patient history, and epidemiological ?information. The expected result is Negative. ? ?Fact Sheet for Patients:  ?EntrepreneurPulse.com.au ? ?Fact Sheet for Healthcare Providers:  ?IncredibleEmployment.be ? ?This test is no t yet approved or cleared by the [place] FDA and  ?has been authorized for detection and/or diagnosis of SARS-CoV-2 by ?FDA under an Emergency Use Authorization (EUA). This EUA will remain  ?in effect (meaning this test can be used) for the duration of the ?COVID-19 declaration under Section 564(b)(1) of the Act, 21 ?U.S.C.section 360bbb-3(b)(1), unless the authorization is terminated  ?or revoked sooner.  ? ? ?  ? Influenza A by PCR NEGATIVE NEGATIVE Final  ? Influenza B by PCR NEGATIVE NEGATIVE Final  ?  Comment: (NOTE) ?The Xpert Xpress SARS-CoV-2/FLU/RSV plus assay is intended as an aid ?in the diagnosis of influenza from Nasopharyngeal swab specimens and ?should not be used as a sole basis for treatment. Nasal washings and ?aspirates are unacceptable for Xpert Xpress SARS-CoV-2/FLU/RSV ?testing. ? ?Fact Sheet for Patients: ?EntrepreneurPulse.com.au ? ?Fact Sheet for Healthcare Providers: ?IncredibleEmployment.be ? ?This test is not yet approved or cleared by the [place] FDA and ?has been authorized for detection and/or diagnosis of SARS-CoV-2 by ?FDA under an Emergency Use Authorization (EUA). This EUA will remain ?in effect (meaning this test can be used) for the duration of the ?COVID-19 declaration under Section 564(b)(1) of the Act, 21 U.S.C. ?section 360bbb-3(b)(1), unless the authorization is terminated or ?revoked. ? ?Performed at [facility], [name], ?[place] [address] ?  ?  ? ? ? ? ? ?Impression: ? ? ?1)Renal   ? ?ESRD on hemodialysis ?Patient is on Monday Wednesday Friday schedule. ?As an outpatient  patient is under care for [facility] and get dialyzed at [facility] on [address] on Monday Wednesday Friday schedule. ?No need for renal replacement therapy today ? ? ?2) hypertensive urgency ? ?Patient antihypertensive regimen was adjusted yesterday ?Patient blood pressure is now much better-At the time of  examination patient blood pressure was 115/75 ? ?3)Anemia of chronic disease ? ? ?  Latest Ref Rng & Units 10/07/2021  ?  7:48 PM 02/18/2021  ? 12:51 PM 02/14/2021  ?  8:58 AM  ?CBC  ?WBC 4.0 - 10.5 K/uL 4.2   7.2     ?Hemoglobin 13.0 - 17.0 g/dL 10.4   8.8   9.5    ?Hematocrit 39.0 - 52.0 % 31.0   25.3   28.0    ?Platelets 150 - 400 K/uL 188   151     ?  ? ? ? HGb at goal (9--11) ? ? ?4) Secondary hyperparathyroidism -CKD Mineral-Bone Disorder ? ? ? ?  Lab Results  ?Component Value Date  ? PTH 194 (H) 05/11/2017  ? CALCIUM 9.3 10/12/2021  ? CAION 1.17 02/14/2021  ? PHOS 3.2 10/12/2021  ? ? ?Secondary Hyperparathyroidism present ? ?Phosphorus at goal. ? ? ?5) altered mental status ?Patient is clinically better ?Patient being followed by the primary team ? ?6) Electrolytes  ? ? ?  Latest Ref Rng & Units 10/12/2021  ?  4:53 AM 10/11/2021  ?  5:20 AM 10/10/2021  ?  6:13 AM  ?BMP  ?Glucose 70 - 99 mg/dL 87   113   77    ?BUN 6 - 20 mg/dL 35   23   35    ?Creatinine 0.61 - 1.24 mg/dL 6.45   5.11   6.63    ?Sodium 135 - 145 mmol/L 131   135   134    ?Potassium 3.5 - 5.1 mmol/L 4.7   4.1   4.3    ?Chloride 98 - 111 mmol/L 90   99   95    ?CO2 22 - 32 mmol/L 29   30   31     ?Calcium 8.9 - 10.3 mg/dL 9.3   9.4   9.4    ?  ? ?Sodium ?Hyponatremia ?Secondary to ESRD ? ? ?Potassium ?Normokalemic ? ? ? ?7)Acid base ? ? ? ?Co2 at goal ? ? ? ? ?Plan: ? ?No need for renal replacement therapy today. ?We will dialyze patient tomorrow ?We will use lower sodium bath and effort to reduce rapid correction of sodium ? ? ?Treyce Spillers s Theador Hawthorne ?10/12/2021, 5:36 PM  ?

## 2021-10-12 NOTE — Progress Notes (Signed)
?PROGRESS NOTE ? ? ? ?Todd Brown  ATF:[id number] DOB: [birth year] DOA: 10/07/2021 ?PCP: Jodi Marble, MD  ? ?Brief Narrative:  ?61 year old with history of HTN, ESRD on HD via left AV fistula, schizophrenia, GERD, muscle spasm came to the ED with complaints of altered mental status and high blood pressure.  Initially patient's systolic blood pressure was greater than 200.  CT of the head was negative.  He was diagnosed with hypertensive urgency started on home medication and briefly on Cardene drip.  Nephrology was consulted for hemodialysis.  PT is recommending SNF. ? ? ?Assessment & Plan: ? Principal Problem: ?  Hypertensive urgency ?Active Problems: ?  Schizophrenia ([place]) ?  Essential hypertension, benign ?  End stage renal disease ([place]) ?  Dialysis patient [facility]) ?  GERD (gastroesophageal reflux disease) ?  Altered mental status ?  ? ? ?Assessment and Plan: ?* Hypertensive urgency ?- BP still remains elevated.  Losartan increased to 100 mg daily, hydralazine changed to 100 mg 3 times daily.   ?D/c'd Norvasc 10 mg daily.  ?5/12 started nifedipine 90 mg p.o. OD by nephro ?5/13 started Imdur 60 mg p.o. daily persistent high blood pressure ?Continue monitor BP and titrate medications accordingly. ? ?Altered mental status, appears to be at baseline ?- Patient resides at [facility].  Ammonia level wnl,  normal B12, elevated ? ?GERD (gastroesophageal reflux disease) ?- PPI ? ?End stage renal disease ([place]) ?- Nephrology consulted for hemodialysis ? ?Essential hypertension, benign ?- I have resumed home antihypertensive medications and as needed coverage ? ?Schizophrenia ([place]) ?- With history of garbled and pressured speech with possible cognitive impairment per psychiatry note on 10/04/2019 ?- Resumed home fluphenazine 2.5 mg daily, trihexyphenidyl 2 mg daily, carbamazepine 1000 mg q. every morning ? ? ? ?DVT prophylaxis: Subcu heparin ?Code Status: Full code ?Family Communication: My colleague spoke with [place]  and patient's RN at the group home [facility] [phone] ? ?Status is: Inpatient ?Remains inpatient appropriate because: PT is recommending SNF. ? ? ?Subjective: ?No acute events overnight, the patient was awake and alert, AAO x1, unable to offer any complaints, patient is unable to provide details.  Patient seems to be resting comfortably. ? ? ?Examination: ?Constitutional: Not in acute distress, appears chronically ill.  ?EYES: Patient seems blind or severely poor vision, unable to describe how is his vision is ? ?Respiratory: Clear to auscultation bilaterally ?Cardiovascular: Normal sinus rhythm, no rubs ?Abdomen: Nontender nondistended good bowel sounds ?Musculoskeletal: No edema noted ?Skin: No rashes seen ?Neurologic: Nonfocal exam ?Psychiatric: Poor judgment and insight ? ?Objective: ?Vitals:  ? 10/12/21 0505 10/12/21 0741 10/12/21 1144 10/12/21 1148  ?BP: (!) 171/72 (!) 199/88 126/73 (!) 168/74  ?Pulse: (!) 57 64 (!) 59 (!) 58  ?Resp: 18 16    ?Temp: 97.6 ?F (36.4 ?C) 97.7 ?F (36.5 ?C) 97.8 ?F (36.6 ?C)   ?TempSrc: Oral     ?SpO2: 93% 97% 95% 97%  ?Weight:      ?Height:      ? ? ?Intake/Output Summary (Last 24 hours) at 10/12/2021 1356 ?Last data filed at 10/12/2021 1337 ?Gross per 24 hour  ?Intake 1320 ml  ?Output --  ?Net 1320 ml  ? ?Filed Weights  ? 10/10/21 0400 10/10/21 1433  ?Weight: 73.9 kg 67.7 kg  ? ? ? ?Data Reviewed:  ? ?CBC: ?Recent Labs  ?Lab 10/07/21 ?1948  ?WBC 4.2  ?NEUTROABS 2.5  ?HGB 10.4*  ?HCT 31.0*  ?MCV 98.4  ?PLT 188  ? ?  Basic Metabolic Panel: ?Recent Labs  ?Lab 10/07/21 ?1948 10/09/21 ?9924 10/10/21 ?2683 10/11/21 ?4196 10/12/21 ?0453  ?NA 140 134* 134* 135 131*  ?K 3.8 3.8 4.3 4.1 4.7  ?CL 98 96* 95* 99 90*  ?CO2 30 30 31 30 29   ?GLUCOSE 86 139* 77 113* 87  ?BUN 31* 24* 35* 23* 35*  ?CREATININE 5.79* 5.09* 6.63* 5.11* 6.45*  ?CALCIUM 10.0 9.3 9.4 9.4 9.3  ?MG  --  1.9 1.9 2.1 2.1  ?PHOS  --   --   --   --  3.2  ? ?GFR: ?Estimated Creatinine Clearance: 11.7 mL/min (A)  (by C-G formula based on SCr of 6.45 mg/dL (H)). ?Liver Function Tests: ?Recent Labs  ?Lab 10/07/21 ?1948  ?AST 25  ?ALT 13  ?ALKPHOS 51  ?BILITOT 0.8  ?PROT 6.9  ?ALBUMIN 3.6  ? ?No results for input(s): LIPASE, AMYLASE in the last 168 hours. ?Recent Labs  ?Lab 10/08/21 ?0023  ?AMMONIA 27  ? ?Coagulation Profile: ?No results for input(s): INR, PROTIME in the last 168 hours. ?Cardiac Enzymes: ?No results for input(s): CKTOTAL, CKMB, CKMBINDEX, TROPONINI in the last 168 hours. ?BNP (last 3 results) ?No results for input(s): PROBNP in the last 8760 hours. ?HbA1C: ?No results for input(s): HGBA1C in the last 72 hours. ?CBG: ?Recent Labs  ?Lab 10/09/21 ?1216 10/09/21 ?1646 10/09/21 ?2014 10/09/21 ?2328 10/09/21 ?2332  ?GLUCAP 112* 88 97 94 92  ? ?Lipid Profile: ?No results for input(s): CHOL, HDL, LDLCALC, TRIG, CHOLHDL, LDLDIRECT in the last 72 hours. ?Thyroid Function Tests: ?No results for input(s): TSH, T4TOTAL, FREET4, T3FREE, THYROIDAB in the last 72 hours. ?Anemia Panel: ?No results for input(s): VITAMINB12, FOLATE, FERRITIN, TIBC, IRON, RETICCTPCT in the last 72 hours. ? ?Sepsis Labs: ?No results for input(s): PROCALCITON, LATICACIDVEN in the last 168 hours. ? ?Recent Results (from the past 240 hour(s))  ?Resp Panel by RT-PCR (Flu A&B, Covid) Nasopharyngeal Swab     Status: None  ? Collection Time: 10/07/21  7:48 PM  ? Specimen: Nasopharyngeal Swab; Nasopharyngeal(NP) swabs in vial transport medium  ?Result Value Ref Range Status  ? SARS Coronavirus 2 by RT PCR NEGATIVE NEGATIVE Final  ?  Comment: (NOTE) ?SARS-CoV-2 target nucleic acids are NOT DETECTED. ? ?The SARS-CoV-2 RNA is generally detectable in upper respiratory ?specimens during the acute phase of infection. The lowest ?concentration of SARS-CoV-2 viral copies this assay can detect is ?138 copies/mL. A negative result does not preclude SARS-Cov-2 ?infection and should not be used as the sole basis for treatment or ?other patient management decisions. A  negative result may occur with  ?improper specimen collection/handling, submission of specimen other ?than nasopharyngeal swab, presence of viral mutation(s) within the ?areas targeted by this assay, and inadequate number of viral ?copies(<138 copies/mL). A negative result must be combined with ?clinical observations, patient history, and epidemiological ?information. The expected result is Negative. ? ?Fact Sheet for Patients:  ?EntrepreneurPulse.com.au ? ?Fact Sheet for Healthcare Providers:  ?IncredibleEmployment.be ? ?This test is no t yet approved or cleared by the [place] FDA and  ?has been authorized for detection and/or diagnosis of SARS-CoV-2 by ?FDA under an Emergency Use Authorization (EUA). This EUA will remain  ?in effect (meaning this test can be used) for the duration of the ?COVID-19 declaration under Section 564(b)(1) of the Act, 21 ?U.S.C.section 360bbb-3(b)(1), unless the authorization is terminated  ?or revoked sooner.  ? ? ?  ? Influenza A by PCR NEGATIVE NEGATIVE Final  ? Influenza B by PCR NEGATIVE NEGATIVE  Final  ?  Comment: (NOTE) ?The Xpert Xpress SARS-CoV-2/FLU/RSV plus assay is intended as an aid ?in the diagnosis of influenza from Nasopharyngeal swab specimens and ?should not be used as a sole basis for treatment. Nasal washings and ?aspirates are unacceptable for Xpert Xpress SARS-CoV-2/FLU/RSV ?testing. ? ?Fact Sheet for Patients: ?EntrepreneurPulse.com.au ? ?Fact Sheet for Healthcare Providers: ?IncredibleEmployment.be ? ?This test is not yet approved or cleared by the [place] FDA and ?has been authorized for detection and/or diagnosis of SARS-CoV-2 by ?FDA under an Emergency Use Authorization (EUA). This EUA will remain ?in effect (meaning this test can be used) for the duration of the ?COVID-19 declaration under Section 564(b)(1) of the Act, 21 U.S.C. ?section 360bbb-3(b)(1), unless the authorization  is terminated or ?revoked. ? ?Performed at [facility], [place], ?[place] [address] ?  ?  ? ? ? ? ? ?Radiology Studies: ?No results found. ? ? ? ? ? ?Scheduled Meds: ? calc

## 2021-10-13 DIAGNOSIS — I160 Hypertensive urgency: Secondary | ICD-10-CM | POA: Diagnosis not present

## 2021-10-13 LAB — BASIC METABOLIC PANEL
Anion gap: 12 (ref 5–15)
BUN: 48 mg/dL — ABNORMAL HIGH (ref 6–20)
CO2: 28 mmol/L (ref 22–32)
Calcium: 9.3 mg/dL (ref 8.9–10.3)
Chloride: 91 mmol/L — ABNORMAL LOW (ref 98–111)
Creatinine, Ser: 7.51 mg/dL — ABNORMAL HIGH (ref 0.61–1.24)
GFR, Estimated: 8 mL/min — ABNORMAL LOW (ref >60)
Glucose, Bld: 80 mg/dL (ref 70–99)
Potassium: 5.7 mmol/L — ABNORMAL HIGH (ref 3.5–5.1)
Sodium: 131 mmol/L — ABNORMAL LOW (ref 135–145)

## 2021-10-13 LAB — CBC
HCT: 28 % — ABNORMAL LOW (ref 39.0–52.0)
Hemoglobin: 9.7 g/dL — ABNORMAL LOW (ref 13.0–17.0)
MCH: 34.2 pg — ABNORMAL HIGH (ref 26.0–34.0)
MCHC: 34.6 g/dL (ref 30.0–36.0)
MCV: 98.6 fL (ref 80.0–100.0)
Platelets: 179 10*3/uL (ref 150–400)
RBC: 2.84 MIL/uL — ABNORMAL LOW (ref 4.22–5.81)
RDW: 15.3 % (ref 11.5–15.5)
WBC: 4.6 10*3/uL (ref 4.0–10.5)
nRBC: 0 % (ref 0.0–0.2)

## 2021-10-13 LAB — MAGNESIUM: Magnesium: 2.3 mg/dL (ref 1.7–2.4)

## 2021-10-13 LAB — PHOSPHORUS: Phosphorus: 3.2 mg/dL (ref 2.5–4.6)

## 2021-10-13 MED ORDER — EPOETIN ALFA 4000 UNIT/ML IJ SOLN
INTRAMUSCULAR | Status: AC
  Filled 2021-10-13: qty 1

## 2021-10-13 MED ORDER — HEPARIN SODIUM (PORCINE) 1000 UNIT/ML IJ SOLN
INTRAMUSCULAR | Status: AC
  Filled 2021-10-13: qty 10

## 2021-10-13 NOTE — Progress Notes (Signed)
PT Cancellation Note ? ?Patient Details ?Name: Avondre Richens Bastarache ?MRN: [id number] ?DOB: 1961-01-25 ? ? ?Cancelled Treatment:    Reason Eval/Treat Not Completed: Medical issues which prohibited therapy: MD request PT be held this date secondary to pt's K 5.7.  Will attempt to see pt at a future date/time as medically appropriate.  ? ? ? ?D. Royetta Asal PT, DPT ?10/13/21, 2:37 PM ? ?

## 2021-10-13 NOTE — Progress Notes (Signed)
?[facility]  ?ROUNDING NOTE  ? ?Subjective:  ? ?Patient is a 61 year old male with a history of hypertension, ESRD on HD, schizophrenia, GERD, muscle spasm admitted to the  hospital due to altered mental status and high blood pressure.  Nephrology was consulted for comanagement of dialysis.  ? ?Patient was seen and examined in the dialysis unit.  He is currently undergoing hemodialysis treatment.  Patient is awake alert able to follow basic commands. Denies any complaints at present.  ? ? ?Objective:  ?Vital signs in last 24 hours:  ?Temp:  [97.5 ?F (36.4 ?C)-98.5 ?F (36.9 ?C)] 97.9 ?F (36.6 ?C) (05/15 1333) ?Pulse Rate:  [41-85] 62 (05/15 1333) ?Resp:  [13-24] 18 (05/15 1333) ?BP: (114-182)/(75-102) 119/88 (05/15 1333) ?SpO2:  [87 %-100 %] 97 % (05/15 1333) ?Weight:  [70.8 kg-71.2 kg] 70.8 kg (05/15 1233) ? ?Weight change:  ?Filed Weights  ? 10/13/21 0925 10/13/21 0930 10/13/21 1233  ?Weight: 71.2 kg 71.2 kg 70.8 kg  ? ? ?Intake/Output: ?I/O last 3 completed shifts: ?In: 1800 [P.O.:1800] ?Out: -  ?  ?Intake/Output this shift: ? Total I/O ?In: -  ?Out: 1500 [Other:1500] ? ?Physical Exam: ?General: No acute distress  ?Head: Normocephalic, atraumatic. Moist oral mucosal membranes  ?Eyes: Anicteric  ?Neck: Supple  ?Lungs:  Clear to auscultation, normal effort  ?Heart: S1S2 no rubs  ?Abdomen:  Soft, nontender, bowel sounds present  ?Extremities:  No edema or cyanosis.  ?Neurologic: Awake, alert, following commands  ?Skin: No acute rash  ?Access: R Permacath and LUE AVF aneurysm present  ? ? ?Basic Metabolic Panel: ?Recent Labs  ?Lab 10/09/21 ?1610 10/10/21 ?9604 10/11/21 ?5409 10/12/21 ?8119 10/13/21 ?0444  ?NA 134* 134* 135 131* 131*  ?K 3.8 4.3 4.1 4.7 5.7*  ?CL 96* 95* 99 90* 91*  ?CO2 30 31 30 29 28   ?GLUCOSE 139* 77 113* 87 80  ?BUN 24* 35* 23* 35* 48*  ?CREATININE 5.09* 6.63* 5.11* 6.45* 7.51*  ?CALCIUM 9.3 9.4 9.4 9.3 9.3  ?MG 1.9 1.9 2.1 2.1 2.3  ?PHOS  --   --   --  3.2 3.2  ? ? ?Liver Function  Tests: ?Recent Labs  ?Lab 10/07/21 ?1948  ?AST 25  ?ALT 13  ?ALKPHOS 51  ?BILITOT 0.8  ?PROT 6.9  ?ALBUMIN 3.6  ? ?No results for input(s): LIPASE, AMYLASE in the last 168 hours. ?Recent Labs  ?Lab 10/08/21 ?0023  ?AMMONIA 27  ? ? ?CBC: ?Recent Labs  ?Lab 10/07/21 ?1948 10/13/21 ?0444  ?WBC 4.2 4.6  ?NEUTROABS 2.5  --   ?HGB 10.4* 9.7*  ?HCT 31.0* 28.0*  ?MCV 98.4 98.6  ?PLT 188 179  ? ? ?Cardiac Enzymes: ?No results for input(s): CKTOTAL, CKMB, CKMBINDEX, TROPONINI in the last 168 hours. ? ?BNP: ?Invalid input(s): POCBNP ? ?CBG: ?Recent Labs  ?Lab 10/09/21 ?1216 10/09/21 ?1646 10/09/21 ?2014 10/09/21 ?2328 10/09/21 ?2332  ?GLUCAP 112* 88 97 94 92  ? ? ?Microbiology: ?Results for orders placed or performed during the hospital encounter of 10/07/21  ?Resp Panel by RT-PCR (Flu A&B, Covid) Nasopharyngeal Swab     Status: None  ? Collection Time: 10/07/21  7:48 PM  ? Specimen: Nasopharyngeal Swab; Nasopharyngeal(NP) swabs in vial transport medium  ?Result Value Ref Range Status  ? SARS Coronavirus 2 by RT PCR NEGATIVE NEGATIVE Final  ?  Comment: (NOTE) ?SARS-CoV-2 target nucleic acids are NOT DETECTED. ? ?The SARS-CoV-2 RNA is generally detectable in upper respiratory ?specimens during the acute phase of infection. The lowest ?concentration of  SARS-CoV-2 viral copies this assay can detect is ?138 copies/mL. A negative result does not preclude SARS-Cov-2 ?infection and should not be used as the sole basis for treatment or ?other patient management decisions. A negative result may occur with  ?improper specimen collection/handling, submission of specimen other ?than nasopharyngeal swab, presence of viral mutation(s) within the ?areas targeted by this assay, and inadequate number of viral ?copies(<138 copies/mL). A negative result must be combined with ?clinical observations, patient history, and epidemiological ?information. The expected result is Negative. ? ?Fact Sheet for Patients:   ?EntrepreneurPulse.com.au ? ?Fact Sheet for Healthcare Providers:  ?IncredibleEmployment.be ? ?This test is no t yet approved or cleared by the [place] FDA and  ?has been authorized for detection and/or diagnosis of SARS-CoV-2 by ?FDA under an Emergency Use Authorization (EUA). This EUA will remain  ?in effect (meaning this test can be used) for the duration of the ?COVID-19 declaration under Section 564(b)(1) of the Act, 21 ?U.S.C.section 360bbb-3(b)(1), unless the authorization is terminated  ?or revoked sooner.  ? ? ?  ? Influenza A by PCR NEGATIVE NEGATIVE Final  ? Influenza B by PCR NEGATIVE NEGATIVE Final  ?  Comment: (NOTE) ?The Xpert Xpress SARS-CoV-2/FLU/RSV plus assay is intended as an aid ?in the diagnosis of influenza from Nasopharyngeal swab specimens and ?should not be used as a sole basis for treatment. Nasal washings and ?aspirates are unacceptable for Xpert Xpress SARS-CoV-2/FLU/RSV ?testing. ? ?Fact Sheet for Patients: ?EntrepreneurPulse.com.au ? ?Fact Sheet for Healthcare Providers: ?IncredibleEmployment.be ? ?This test is not yet approved or cleared by the [place] FDA and ?has been authorized for detection and/or diagnosis of SARS-CoV-2 by ?FDA under an Emergency Use Authorization (EUA). This EUA will remain ?in effect (meaning this test can be used) for the duration of the ?COVID-19 declaration under Section 564(b)(1) of the Act, 21 U.S.C. ?section 360bbb-3(b)(1), unless the authorization is terminated or ?revoked. ? ?Performed at [facility], [place], ?[place] [address] ?  ? ? ?Coagulation Studies: ?No results for input(s): LABPROT, INR in the last 72 hours. ? ?Urinalysis: ?No results for input(s): COLORURINE, LABSPEC, [place], GLUCOSEU, HGBUR, BILIRUBINUR, KETONESUR, PROTEINUR, UROBILINOGEN, NITRITE, LEUKOCYTESUR in the last 72 hours. ? ?Invalid input(s): APPERANCEUR  ? ? ?Imaging: ?No  results found. ? ? ?Medications:  ? ? ? calcium acetate  1,334 mg Oral TID WC  ? carbamazepine  1,000 mg Oral q AM  ? Chlorhexidine Gluconate Cloth  6 each Topical Q0600  ? cholecalciferol  1,000 Units Oral Daily  ? cloNIDine  0.3 mg Oral TID  ? epoetin (EPOGEN/PROCRIT) injection  3,000 Units Intravenous Q M,W,F-HD  ? ferrous sulfate  325 mg Oral Daily  ? fluPHENAZine  2.5 mg Oral Daily  ? heparin  5,000 Units Subcutaneous Q8H  ? hydrALAZINE  100 mg Oral Q8H  ? isosorbide mononitrate  60 mg Oral Daily  ? losartan  100 mg Oral Daily  ? mouth rinse  15 mL Mouth Rinse BID  ? mometasone-formoterol  2 puff Inhalation BID  ? multivitamin  1 tablet Oral QHS  ? NIFEdipine  90 mg Oral Daily  ? pantoprazole  80 mg Oral Daily  ? trihexyphenidyl  2 mg Oral BID  ? ?guaiFENesin, hydrALAZINE, ipratropium-albuterol, melatonin, metoprolol tartrate, ondansetron **OR** ondansetron (ZOFRAN) IV, polyethylene glycol, senna-docusate, tiZANidine, traZODone ? ?Assessment/ Plan:  ?61 y.o. male with a history of hypertension, ESRD on HD, schizophrenia, GERD, muscle spasm admitted to the hospital due to altered mental status and high blood  pressure.  Nephrology was consulted for comanagement of dialysis.  ? ?ESRD on hemodialysis Monday Wednesday Friday schedule.  Patient is receiving hemodialysis treatment today with the UF goal of 2 L.  Patient has been getting dialysis at [facility] on [address]. as outpatient, Monday Wednesday Friday schedule.  ?Hypertensive urgency.  Status post adjustment of antihypertensive regimen.  Blood pressure is stabilized.  Monitor closely. ?Anemia of chronic disease.  Continue to monitor H&H.  Latest hemoglobin 9.7 ?Secondary hyperparathyroidism. Continue to monitor    phosphorus level.  Phosphorus at goal. ?Altered mental status/schizophrenia.  History of garbled/pressured speech possible cognitive impairment per psychiatry.  ?   ? ? LOS: 5 ?[place] ?5/15/20232:10 PM ?  ?

## 2021-10-13 NOTE — Progress Notes (Signed)
Pt to go to HD today.  Pt here for HTN emergency.  MD Theador Hawthorne order to administer IMDUR and COZAAR prior to HD.   ?

## 2021-10-13 NOTE — Progress Notes (Signed)
Hemodialysis Post Treatment Note ? ?13 Oct 2021 ? ?Access: ?RIJ Catheter  ? ?Treatment Time: ?3-Hours ? ?UF Removed: ?1.5 liter  ? ?Next Scheduled Treatment: ?10/15/2021 ? ?Note:  ? ? Patient presents for treatment without concern, CVC intact, no signs of infection, dressing changed. Treatment goals achieved this session, targeted UF met, with 1.5-liter fluid removal. Prescribed BFR maintained throughout the course of treatment. Patient calm and cooperative, responsive. Transported to assigned room post treatment, report given.   ?

## 2021-10-13 NOTE — Progress Notes (Signed)
Speech Language Pathology Treatment:    ?Patient Details ?Name: Todd Brown ?MRN: [id number] ?DOB: 06-06-1960 ?Today's Date: 10/13/2021 ?Time: 6767-2094 ?SLP Time Calculation (min) (ACUTE ONLY): 15 min ? ?Assessment / Plan / Recommendation ?Clinical Impression ? Pt seen for diet tolerance. Pt alert and pleasantly confused. Pt on room air. Pt being fed breakfast by Nurse Tech upon SLP entrance to room.  ? ?Per chart review, temp and WBC WNL. No recent chest imaging.  ? ?Pt observed with items from pureed diet with thin liquids meal tray including oatmeal, applesauce, and juice (via straw sip). Pt with mildly prolonged oral prep/A-P transit with pureed; however, this was not appreciated with thin liquids via straw sip and adequate oral clearance was achieved across consistencies. Suspect oral deficits due to dental status and exacerbated by mental status. No overt or subtle s/sx pharyngeal dysphagia noted across trials. Pt with good oral intake as pt consumed 100% meal.  ? ?Both Nurse Tech and RN commented on improved oral efficiency since diet downgrade to pureed diet with thin liquids.  ? ?Recommend continuation of pureed diet with thin liquids with safe swallowing strategies/aspiration precautions as outlined below. ? ?Pt is at increased risk for aspiration/aspiration PNA given dental status, mental status, and need for assistance with feeding.  ? ?Recommend post-acute SLP services for trials of upgraded textures at next level of care pending improvements in mental status.  ? ?SLP to sign off as pt has no acute SLP needs at this time. Pt, Nurse Tech, and RN made aware of results, recommendations, and POC. ?full understanding by pt. Pt left in care of Nurse Tech and RN.  ?  ?HPI HPI: Per H&P "Todd Brown is a 61 year old male with history of hypertension, ESRD on HD via left AV fistula, schizophrenia, GERD, muscle spasms, who presents emergency department for chief concerns of altered mental status and high  blood pressure.     Initial vitals in ED showed temperature of 98.1, respiration rate of 15, heart 77, initial blood pressure 123 over 96 and increased to 204/87, SPO2 of 93% on room air.    Labs is remarkable for BUN of 31, serum creatinine 5.79, GFR of 10, high sensitive troponin is 29, and decreased to 25.     WBC was not elevated at 4.2.  Hemoglobin 10.4, platelets of 188.     CT of the head without contrast was read as no acute intracranial findings seen in the noncontrast CT of the brain. Atrophy. Small vessel disease.     ED treatment: Hydralazine 5 mg IV one-time dose ordered     At bedside patient has stuttering, articulation error and per EDP this is baseline.  He was able to tell me his name and his age though this was very difficult for me to understand.  Per nursing staff, he had a pack of cigarettes in his socks that were removed.     He was not able to answer if he was hurting anywhere, alcohol use, drug use, with the specific questions he repeated it after me without answering the questions.     Social history: Patient is from [facility].  He endorses tobacco use.     Vaccination history: Unknown     ROS: Unable to complete due to garbled and difficult to comprehend speech." ?  ?   ?SLP Plan ?   ? ?All further Speech Lanaguage Pathology  needs can be addressed in the next venue of care ?  ?Recommendations  for follow up therapy are one component of a multi-disciplinary discharge planning process, led by the attending physician.  Recommendations may be updated based on patient status, additional functional criteria and insurance authorization. ?  ? ?Recommendations  ?Diet recommendations: Dysphagia 1 (puree);Thin liquid ?Medication Administration: Crushed with puree ?Supervision: Staff to assist with self feeding;Full supervision/cueing for compensatory strategies ?Compensations: Minimize environmental distractions;Slow rate;Small sips/bites ?Postural Changes and/or Swallow Maneuvers:  Out of bed for meals;Seated upright 90 degrees;Upright 30-60 min after meal  ?   ?    ?   ? ? ? ? Oral Care Recommendations: Oral care BID;Staff/trained caregiver to provide oral care ?Follow Up Recommendations: Skilled nursing-short term rehab (<3 hours/day) ?Assistance recommended at discharge: Frequent or constant Supervision/Assistance ?SLP Visit Diagnosis: Dysphagia, oral phase (R13.11) ? ? ? ? ?  ?  ? ?Cherrie Gauze, M.S., CCC-SLP ?Speech-Language Pathologist ?[facility] ?[phone] ([place])  ? ?Quintella Baton ? ?10/13/2021, 11:47 AM ?

## 2021-10-13 NOTE — Progress Notes (Signed)
?PROGRESS NOTE ? ? ? ?Todd Brown  NWG:[id number] DOB: [birth year] DOA: 10/07/2021 ?PCP: Jodi Marble, MD  ? ? ?Brief Narrative:  ?This 61 year old Male with history of HTN, ESRD on HD via left AV fistula, schizophrenia, GERD, muscle spasm came to the ED with complaints of altered mental status and high blood pressure.  Initially patient's systolic blood pressure was greater than 200.  CT of the head was negative.  He was diagnosed with hypertensive urgency started on home medication and briefly on Cardene drip.  Nephrology was consulted for hemodialysis.  PT is recommending SNF. ? ? ?Assessment & Plan: ?  ?Principal Problem: ?  Hypertensive urgency ?Active Problems: ?  Schizophrenia ([place]) ?  Essential hypertension, benign ?  End stage renal disease ([place]) ?  Dialysis patient [facility]) ?  GERD (gastroesophageal reflux disease) ?  Altered mental status ? ? ?* Hypertensive urgency > Improving ?Blood pressure remains elevated.   ?Losartan increased to 100 mg daily, hydralazine changed to 100 mg 3 times daily.   ?D/c'd Norvasc 10 mg daily.  ?5/12 started nifedipine 90 mg p.o. OD by nephro ?5/13 started Imdur 60 mg p.o. daily persistent high blood pressure ?Continue monitor BP and titrate medications accordingly. ?  ?Altered mental status, appears to be at baseline. ?- Patient resides at [place] [facility].  Ammonia level wnl,  normal B12, elevated. ?-Patient seems at her baseline. ?  ?GERD (gastroesophageal reflux disease) ?- Continue PPI ?  ?End stage renal disease ([place]) ?- Nephrology consulted for hemodialysis.  Continue hemodialysis as per schedule. ?  ?Essential hypertension, benign ?Continue blood pressure medications as above. ?  ?Schizophrenia ([place]) ?- With history of garbled and pressured speech with possible cognitive impairment per psychiatry note on 10/04/2019 ?- Resumed home fluphenazine 2.5 mg daily, trihexyphenidyl 2 mg daily, carbamazepine 1000 mg q. every morning ?  ? ?DVT prophylaxis: Heparin subcu ?Code  Status: Full code ?Family Communication: No family at bedside ?Disposition Plan:  ?Admitted for altered mentation CT head unremarkable , continued hemodialysis as per schedule. ?Medically clear awaiting SNF placement insurance authorization. ? ? ?Consultants:  ?Nephrology  ? ?Procedures: Scheduled hemodialysis ? ?Antimicrobials: None. ? ?Subjective: ?Patient was seen and examined at the dialysis suite.  Overnight events noted. ?Patient seems back to her baseline mental status.  She is alert, following commands but not oriented to place and time. ? ?Objective: ?Vitals:  ? 10/13/21 1230 10/13/21 1233 10/13/21 1251 10/13/21 1333  ?BP: (!) 182/102 (!) 169/88  119/88  ?Pulse:    62  ?Resp: 16 15 19 18   ?Temp:  98.1 ?F (36.7 ?C)  97.9 ?F (36.6 ?C)  ?TempSrc:  Axillary  Oral  ?SpO2:    97%  ?Weight:  70.8 kg    ?Height:      ? ? ?Intake/Output Summary (Last 24 hours) at 10/13/2021 1341 ?Last data filed at 10/13/2021 1233 ?Gross per 24 hour  ?Intake 720 ml  ?Output 1500 ml  ?Net -780 ml  ? ?Filed Weights  ? 10/13/21 0925 10/13/21 0930 10/13/21 1233  ?Weight: 71.2 kg 71.2 kg 70.8 kg  ? ? ?Examination: ? ?General exam: Appears comfortable, not in any acute distress, chronically ill looking. ?Respiratory system: CTA bilaterally, respiratory effort normal, no wheezing, no crackles. ?Cardiovascular system: S1 & S2 heard, regular rate and rhythm, no murmur. ?Gastrointestinal system: Abdomen is soft, non tender, nondistended, BS +. ?Central nervous system: Alert and oriented x 1. No focal neurological deficits. ?Extremities:.  No edema, no cyanosis, no clubbing ?Skin: No  rashes, lesions or ulcers ?Psychiatry: Poor judgment and insight. ? ? ? ?Data Reviewed: I have personally reviewed following labs and imaging studies ? ?CBC: ?Recent Labs  ?Lab 10/07/21 ?1948 10/13/21 ?0444  ?WBC 4.2 4.6  ?NEUTROABS 2.5  --   ?HGB 10.4* 9.7*  ?HCT 31.0* 28.0*  ?MCV 98.4 98.6  ?PLT 188 179  ? ?Basic Metabolic Panel: ?Recent Labs  ?Lab 10/09/21 ?8921  10/10/21 ?1941 10/11/21 ?7408 10/12/21 ?1448 10/13/21 ?0444  ?NA 134* 134* 135 131* 131*  ?K 3.8 4.3 4.1 4.7 5.7*  ?CL 96* 95* 99 90* 91*  ?CO2 30 31 30 29 28   ?GLUCOSE 139* 77 113* 87 80  ?BUN 24* 35* 23* 35* 48*  ?CREATININE 5.09* 6.63* 5.11* 6.45* 7.51*  ?CALCIUM 9.3 9.4 9.4 9.3 9.3  ?MG 1.9 1.9 2.1 2.1 2.3  ?PHOS  --   --   --  3.2 3.2  ? ?GFR: ?Estimated Creatinine Clearance: 10.5 mL/min (A) (by C-G formula based on SCr of 7.51 mg/dL (H)). ?Liver Function Tests: ?Recent Labs  ?Lab 10/07/21 ?1948  ?AST 25  ?ALT 13  ?ALKPHOS 51  ?BILITOT 0.8  ?PROT 6.9  ?ALBUMIN 3.6  ? ?No results for input(s): LIPASE, AMYLASE in the last 168 hours. ?Recent Labs  ?Lab 10/08/21 ?0023  ?AMMONIA 27  ? ?Coagulation Profile: ?No results for input(s): INR, PROTIME in the last 168 hours. ?Cardiac Enzymes: ?No results for input(s): CKTOTAL, CKMB, CKMBINDEX, TROPONINI in the last 168 hours. ?BNP (last 3 results) ?No results for input(s): PROBNP in the last 8760 hours. ?HbA1C: ?No results for input(s): HGBA1C in the last 72 hours. ?CBG: ?Recent Labs  ?Lab 10/09/21 ?1216 10/09/21 ?1646 10/09/21 ?2014 10/09/21 ?2328 10/09/21 ?2332  ?GLUCAP 112* 88 97 94 92  ? ?Lipid Profile: ?No results for input(s): CHOL, HDL, LDLCALC, TRIG, CHOLHDL, LDLDIRECT in the last 72 hours. ?Thyroid Function Tests: ?No results for input(s): TSH, T4TOTAL, FREET4, T3FREE, THYROIDAB in the last 72 hours. ?Anemia Panel: ?No results for input(s): VITAMINB12, FOLATE, FERRITIN, TIBC, IRON, RETICCTPCT in the last 72 hours. ?Sepsis Labs: ?No results for input(s): PROCALCITON, LATICACIDVEN in the last 168 hours. ? ?Recent Results (from the past 240 hour(s))  ?Resp Panel by RT-PCR (Flu A&B, Covid) Nasopharyngeal Swab     Status: None  ? Collection Time: 10/07/21  7:48 PM  ? Specimen: Nasopharyngeal Swab; Nasopharyngeal(NP) swabs in vial transport medium  ?Result Value Ref Range Status  ? SARS Coronavirus 2 by RT PCR NEGATIVE NEGATIVE Final  ?  Comment: (NOTE) ?SARS-CoV-2  target nucleic acids are NOT DETECTED. ? ?The SARS-CoV-2 RNA is generally detectable in upper respiratory ?specimens during the acute phase of infection. The lowest ?concentration of SARS-CoV-2 viral copies this assay can detect is ?138 copies/mL. A negative result does not preclude SARS-Cov-2 ?infection and should not be used as the sole basis for treatment or ?other patient management decisions. A negative result may occur with  ?improper specimen collection/handling, submission of specimen other ?than nasopharyngeal swab, presence of viral mutation(s) within the ?areas targeted by this assay, and inadequate number of viral ?copies(<138 copies/mL). A negative result must be combined with ?clinical observations, patient history, and epidemiological ?information. The expected result is Negative. ? ?Fact Sheet for Patients:  ?EntrepreneurPulse.com.au ? ?Fact Sheet for Healthcare Providers:  ?IncredibleEmployment.be ? ?This test is no t yet approved or cleared by the [place] FDA and  ?has been authorized for detection and/or diagnosis of SARS-CoV-2 by ?FDA under an Emergency Use Authorization (EUA). This EUA will  remain  ?in effect (meaning this test can be used) for the duration of the ?COVID-19 declaration under Section 564(b)(1) of the Act, 21 ?U.S.C.section 360bbb-3(b)(1), unless the authorization is terminated  ?or revoked sooner.  ? ? ?  ? Influenza A by PCR NEGATIVE NEGATIVE Final  ? Influenza B by PCR NEGATIVE NEGATIVE Final  ?  Comment: (NOTE) ?The Xpert Xpress SARS-CoV-2/FLU/RSV plus assay is intended as an aid ?in the diagnosis of influenza from Nasopharyngeal swab specimens and ?should not be used as a sole basis for treatment. Nasal washings and ?aspirates are unacceptable for Xpert Xpress SARS-CoV-2/FLU/RSV ?testing. ? ?Fact Sheet for Patients: ?EntrepreneurPulse.com.au ? ?Fact Sheet for Healthcare  Providers: ?IncredibleEmployment.be ? ?This test is not yet approved or cleared by the [place] FDA and ?has been authorized for detection and/or diagnosis of SARS-CoV-2 by ?FDA under an Emergency Use Authorization (EUA). This EUA will

## 2021-10-13 NOTE — Plan of Care (Signed)
?  Problem: SLP Dysphagia Goals ?Goal: Misc Dysphagia Goal ?Description: Pt will tolerated pureed diet with thin liquids with adequate oral efficiency and without overt s/sx pharyngeal dysphagia or changes to lung status concerning for aspiration PNA. ?Outcome: Completed/Met ?  ?

## 2021-10-14 DIAGNOSIS — I160 Hypertensive urgency: Secondary | ICD-10-CM | POA: Diagnosis not present

## 2021-10-14 LAB — CBC
HCT: 28.5 % — ABNORMAL LOW (ref 39.0–52.0)
Hemoglobin: 9.6 g/dL — ABNORMAL LOW (ref 13.0–17.0)
MCH: 33.3 pg (ref 26.0–34.0)
MCHC: 33.7 g/dL (ref 30.0–36.0)
MCV: 99 fL (ref 80.0–100.0)
Platelets: 168 10*3/uL (ref 150–400)
RBC: 2.88 MIL/uL — ABNORMAL LOW (ref 4.22–5.81)
RDW: 15.9 % — ABNORMAL HIGH (ref 11.5–15.5)
WBC: 3.9 10*3/uL — ABNORMAL LOW (ref 4.0–10.5)
nRBC: 0 % (ref 0.0–0.2)

## 2021-10-14 LAB — BASIC METABOLIC PANEL
Anion gap: 10 (ref 5–15)
BUN: 36 mg/dL — ABNORMAL HIGH (ref 8–23)
CO2: 28 mmol/L (ref 22–32)
Calcium: 9.2 mg/dL (ref 8.9–10.3)
Chloride: 93 mmol/L — ABNORMAL LOW (ref 98–111)
Creatinine, Ser: 5.39 mg/dL — ABNORMAL HIGH (ref 0.61–1.24)
GFR, Estimated: 11 mL/min — ABNORMAL LOW (ref >60)
Glucose, Bld: 77 mg/dL (ref 70–99)
Potassium: 5.1 mmol/L (ref 3.5–5.1)
Sodium: 131 mmol/L — ABNORMAL LOW (ref 135–145)

## 2021-10-14 LAB — MAGNESIUM: Magnesium: 2 mg/dL (ref 1.7–2.4)

## 2021-10-14 LAB — PHOSPHORUS: Phosphorus: 3.2 mg/dL (ref 2.5–4.6)

## 2021-10-14 MED ORDER — CARBAMAZEPINE 100 MG PO CHEW
1000.0000 mg | CHEWABLE_TABLET | Freq: Every morning | ORAL | Status: DC
  Administered 2021-10-14 – 2021-10-16 (×3): 1000 mg via ORAL
  Filled 2021-10-14 (×3): qty 10

## 2021-10-14 NOTE — Progress Notes (Signed)
[facility]  ROUNDING NOTE   Subjective:   Patient is a 61 year old male with a history of hypertension, ESRD on HD, schizophrenia, GERD, muscle spasm admitted due to altered mental status and high blood pressure.   Patient was seen and examined at the bedside.  He is awake alert and following basic commands.  Patient stated that he is feeling sleepy this morning. Denies any complaints.  Objective:  Vital signs in last 24 hours:  Temp:  [97.5 F (36.4 C)-98.4 F (36.9 C)] 97.6 F (36.4 C) (05/16 1132) Pulse Rate:  [58-67] 60 (05/16 1132) Resp:  [15-20] 18 (05/16 1132) BP: (119-212)/(67-102) 132/87 (05/16 1132) SpO2:  [96 %-100 %] 97 % (05/16 1132) Weight:  [70.8 kg] 70.8 kg (05/15 1233)  Weight change:  Filed Weights   10/13/21 0925 10/13/21 0930 10/13/21 1233  Weight: 71.2 kg 71.2 kg 70.8 kg    Intake/Output: I/O last 3 completed shifts: In: 960 [P.O.:960] Out: 1500 [Other:1500]   Intake/Output this shift:  No intake/output data recorded.  Physical Exam: General: No acute distress  Head: Normocephalic, atraumatic. Moist oral mucosal membranes  Eyes: Anicteric  Neck: Supple  Lungs:  Clear to auscultation, normal effort  Heart: S1S2 no rubs  Abdomen:  Soft, nontender, bowel sounds present  Extremities:  No edema or cyanosis.  Neurologic: Awake, alert, following commands  Skin: No acute rash  Access: R Permacath and LUE AVF aneurysm present    Basic Metabolic Panel: Recent Labs  Lab 10/10/21 0613 10/11/21 0520 10/12/21 0453 10/13/21 0444 10/14/21 0517  NA 134* 135 131* 131* 131*  K 4.3 4.1 4.7 5.7* 5.1  CL 95* 99 90* 91* 93*  CO2 31 30 29 28 28   GLUCOSE 77 113* 87 80 77  BUN 35* 23* 35* 48* 36*  CREATININE 6.63* 5.11* 6.45* 7.51* 5.39*  CALCIUM 9.4 9.4 9.3 9.3 9.2  MG 1.9 2.1 2.1 2.3 2.0  PHOS  --   --  3.2 3.2 3.2     Liver Function Tests: Recent Labs  Lab 10/07/21 1948  AST 25  ALT 13  ALKPHOS 51  BILITOT 0.8  PROT 6.9   ALBUMIN 3.6    No results for input(s): LIPASE, AMYLASE in the last 168 hours. Recent Labs  Lab 10/08/21 0023  AMMONIA 27     CBC: Recent Labs  Lab 10/07/21 1948 10/13/21 0444 10/14/21 0517  WBC 4.2 4.6 3.9*  NEUTROABS 2.5  --   --   HGB 10.4* 9.7* 9.6*  HCT 31.0* 28.0* 28.5*  MCV 98.4 98.6 99.0  PLT 188 179 168     Cardiac Enzymes: No results for input(s): CKTOTAL, CKMB, CKMBINDEX, TROPONINI in the last 168 hours.  BNP: Invalid input(s): POCBNP  CBG: Recent Labs  Lab 10/09/21 1216 10/09/21 1646 10/09/21 2014 10/09/21 2328 10/09/21 2332  GLUCAP 112* 88 97 94 92     Microbiology: Results for orders placed or performed during the hospital encounter of 10/07/21  Resp Panel by RT-PCR (Flu A&B, Covid) Nasopharyngeal Swab     Status: None   Collection Time: 10/07/21  7:48 PM   Specimen: Nasopharyngeal Swab; Nasopharyngeal(NP) swabs in vial transport medium  Result Value Ref Range Status   SARS Coronavirus 2 by RT PCR NEGATIVE NEGATIVE Final    Comment: (NOTE) SARS-CoV-2 target nucleic acids are NOT DETECTED.  The SARS-CoV-2 RNA is generally detectable in upper respiratory specimens during the acute phase of infection. The lowest concentration of SARS-CoV-2 viral copies this assay can detect  is 138 copies/mL. A negative result does not preclude SARS-Cov-2 infection and should not be used as the sole basis for treatment or other patient management decisions. A negative result may occur with  improper specimen collection/handling, submission of specimen other than nasopharyngeal swab, presence of viral mutation(s) within the areas targeted by this assay, and inadequate number of viral copies(<138 copies/mL). A negative result must be combined with clinical observations, patient history, and epidemiological information. The expected result is Negative.  Fact Sheet for Patients:  EntrepreneurPulse.com.au  Fact Sheet for Healthcare  Providers:  IncredibleEmployment.be  This test is no t yet approved or cleared by the [place] FDA and  has been authorized for detection and/or diagnosis of SARS-CoV-2 by FDA under an Emergency Use Authorization (EUA). This EUA will remain  in effect (meaning this test can be used) for the duration of the COVID-19 declaration under Section 564(b)(1) of the Act, 21 U.S.C.section 360bbb-3(b)(1), unless the authorization is terminated  or revoked sooner.       Influenza A by PCR NEGATIVE NEGATIVE Final   Influenza B by PCR NEGATIVE NEGATIVE Final    Comment: (NOTE) The Xpert Xpress SARS-CoV-2/FLU/RSV plus assay is intended as an aid in the diagnosis of influenza from Nasopharyngeal swab specimens and should not be used as a sole basis for treatment. Nasal washings and aspirates are unacceptable for Xpert Xpress SARS-CoV-2/FLU/RSV testing.  Fact Sheet for Patients: EntrepreneurPulse.com.au  Fact Sheet for Healthcare Providers: IncredibleEmployment.be  This test is not yet approved or cleared by the [place] FDA and has been authorized for detection and/or diagnosis of SARS-CoV-2 by FDA under an Emergency Use Authorization (EUA). This EUA will remain in effect (meaning this test can be used) for the duration of the COVID-19 declaration under Section 564(b)(1) of the Act, 21 U.S.C. section 360bbb-3(b)(1), unless the authorization is terminated or revoked.  Performed at [facility], [address] [place], [place] [address]     Coagulation Studies: No results for input(s): LABPROT, INR in the last 72 hours.  Urinalysis: No results for input(s): COLORURINE, LABSPEC, PHURINE, GLUCOSEU, HGBUR, BILIRUBINUR, KETONESUR, PROTEINUR, UROBILINOGEN, NITRITE, LEUKOCYTESUR in the last 72 hours.  Invalid input(s): APPERANCEUR    Imaging: No results found.   Medications:     calcium acetate  1,334 mg Oral  TID WC   carbamazepine  1,000 mg Oral q AM   Chlorhexidine Gluconate Cloth  6 each Topical Q0600   cholecalciferol  1,000 Units Oral Daily   cloNIDine  0.3 mg Oral TID   epoetin (EPOGEN/PROCRIT) injection  3,000 Units Intravenous Q M,W,F-HD   ferrous sulfate  325 mg Oral Daily   fluPHENAZine  2.5 mg Oral Daily   heparin  5,000 Units Subcutaneous Q8H   hydrALAZINE  100 mg Oral Q8H   isosorbide mononitrate  60 mg Oral Daily   losartan  100 mg Oral Daily   mouth rinse  15 mL Mouth Rinse BID   mometasone-formoterol  2 puff Inhalation BID   multivitamin  1 tablet Oral QHS   NIFEdipine  90 mg Oral Daily   pantoprazole  80 mg Oral Daily   trihexyphenidyl  2 mg Oral BID   guaiFENesin, hydrALAZINE, ipratropium-albuterol, melatonin, metoprolol tartrate, ondansetron **OR** ondansetron (ZOFRAN) IV, polyethylene glycol, senna-docusate, tiZANidine, traZODone  Assessment/ Plan:  61 y.o. male with a history of hypertension, ESRD on HD, schizophrenia, GERD, muscle spasm admitted to the hospital due to altered mental status and high blood pressure.  Nephrology was consulted for comanagement  of dialysis.   ESRD on hemodialysis Monday Wednesday Friday schedule.  Patient received hemodialysis yesterday and he will be getting another treatment tomorrow. Patient has been getting dialysis at [facility] on [address]. as outpatient, Monday Wednesday Friday schedule.  Follow-up renal parameters. Hypertensive urgency.  Status post adjustment of antihypertensive regimen.  Latest blood pressure 132/87. Monitor closely. Anemia of chronic disease.  Continue to monitor H&H.  Latest hemoglobin 9.6. Secondary hyperparathyroidism. Continue to monitor    phosphorus level.  Latest phosphorus level 3.2. Altered mental status/schizophrenia.  History of garbled/pressured speech possible cognitive impairment as per psych.     LOS: [place] 5/16/202312:21 PM

## 2021-10-14 NOTE — TOC Progression Note (Signed)
Transition of Care (TOC) - Progression Note  ? ? ?Patient Details  ?Name: Todd Brown ?MRN: [id number] ?Date of Birth: 1960/07/01 ? ?Transition of Care (TOC) CM/SW Contact  ?Alberteen Sam, LCSW ?Phone Number: ?10/14/2021, 3:45 PM ? ?Clinical Narrative:    ? ? ?CSW spoke to [facility] at [phone], they report previous admission rep no longer working there to reach out to [place] at [phone].  ? ?Plans for patient to return to [facility] when medically cleared to dc.  ? ?  ?  ? ?Expected Discharge Plan and Services ?  ?  ?  ?  ?  ?                ?  ?  ?  ?  ?  ?  ?  ?  ?  ?  ? ? ?Social Determinants of Health (SDOH) Interventions ?  ? ?Readmission Risk Interventions ?   ? View : No data to display.  ?  ?  ?  ? ? ?

## 2021-10-14 NOTE — Progress Notes (Signed)
Physical Therapy Treatment ?Patient Details ?Name: Todd Brown ?MRN: [id number] ?DOB: Mar 31, 1961 ?Today's Date: 10/14/2021 ? ? ?History of Present Illness Pt is a 61 year old male with history of hypertension, ESRD on HD via left AV fistula, schizophrenia, GERD, muscle spasms, who presents emergency department for chief concerns of altered mental status and high blood pressure. MD assessment includes: Hypertensive urgency and AMS. ? ?  ?PT Comments  ? ? Co-treat performed with OT due to pt requiring increased assistance in previous sessions.  Pt performed much better during today's sessions with increased functional mobility and decreased lethargy as noted in previous sessions.  Pt performed STS with CGA and able to transfer to chair with CGA +2.  Pt requires consistent verbal cues for transfer due to self-reported poor eyesight.  Pt likely able to peform transfers and ambulation with better technique, however limited due to eyesight at this time.  Pt left in recliner with chair alarm set and nursing in room.  Nursing notified of pt needing a physical call bell button due to eyesight deficits and being unable to see button on bed.  Updated recommendations for d/c have been applied at this time.  Current discharge plans to home with HHPT are appropriate at this time.  Pt will continue to benefit from skilled therapy in order to address deficits listed below. ? ? ?  ?Recommendations for follow up therapy are one component of a multi-disciplinary discharge planning process, led by the attending physician.  Recommendations may be updated based on patient status, additional functional criteria and insurance authorization. ? ?Follow Up Recommendations ? Home health PT ?  ?  ?Assistance Recommended at Discharge Intermittent Supervision/Assistance  ?Patient can return home with the following A little help with walking and/or transfers;A little help with bathing/dressing/bathroom ?  ?Equipment Recommendations ? None  recommended by PT  ?  ?Recommendations for Other Services   ? ? ?  ?Precautions / Restrictions Precautions ?Precautions: Fall ?Precaution Comments: LUE fistula ?Restrictions ?Weight Bearing Restrictions: No ?Other Position/Activity Restrictions: LUE fistula  ?  ? ?Mobility ? Bed Mobility ?Overal bed mobility: Needs Assistance ?Bed Mobility: Supine to Sit ?  ?  ?Supine to sit: Supervision, HOB elevated ?  ?  ?  ?  ? ?Transfers ?Overall transfer level: Needs assistance ?Equipment used: Rolling walker (2 wheels) ?Transfers: Sit to/from Stand ?Sit to Stand: Supervision, Min guard, +2 safety/equipment ?Stand pivot transfers: Min assist, +2 safety/equipment ?  ?  ?  ?  ?General transfer comment: MinA for directing pt due to poor eyesight. ?  ? ?Ambulation/Gait ?Ambulation/Gait assistance: Min assist ?Gait Distance (Feet): 3 Feet ?Assistive device: Rolling walker (2 wheels) ?  ?Gait velocity: decreased. ?  ?  ?General Gait Details: taking small steps within room to transfer to the recliner.  pt has difficulty with sight, needing guidance/direction because of poor eyesight more than anything. ? ? ?Stairs ?  ?  ?  ?  ?  ? ? ?Wheelchair Mobility ?  ? ?Modified Rankin (Stroke Patients Only) ?  ? ? ?  ?Balance Overall balance assessment: Needs assistance ?Sitting-balance support: Bilateral upper extremity supported, Feet supported ?Sitting balance-[name]: Fair ?Sitting balance - Comments: posterior LOB with dynamic sitting ?  ?Standing balance support: Bilateral upper extremity supported ?Standing balance-[name]: Fair ?  ?  ?  ?  ?  ?  ?  ?  ?  ?  ?  ?  ?  ? ?  ?Cognition Arousal/Alertness: Awake/alert ?Behavior During Therapy: Flat affect ?Overall Cognitive  Status: No family/caregiver present to determine baseline cognitive functioning ?Area of Impairment: Orientation, Memory, Following commands, Attention, Awareness, Safety/judgement, Problem solving ?  ?  ?  ?  ?  ?  ?  ?  ?Orientation Level: Disoriented to, Place,  Time, Situation ("I'm at rehab") ?Current Attention Level: Sustained ?Memory: Decreased short-term memory ?Following Commands: Follows one step commands with increased time ?Safety/Judgement: Decreased awareness of safety ?Awareness: Intellectual ?Problem Solving: Difficulty sequencing, Requires verbal cues, Requires tactile cues ?  ?  ?  ? ?  ?Exercises Total Joint Exercises ?Ankle Circles/Pumps: AROM, Strengthening, Both, 10 reps, Seated ?Long Arc Quad: AROM, Strengthening, Both, 10 reps, Seated ?Marching in Standing: AROM, Strengthening, Both, 10 reps, Seated ? ?  ?General Comments   ?  ?  ? ?Pertinent Vitals/Pain Pain Assessment ?Pain Assessment: No/denies pain  ? ? ?Home Living   ?  ?  ?  ?  ?  ?  ?  ?  ?  ?   ?  ?Prior Function    ?  ?  ?   ? ?PT Goals (current goals can now be found in the care plan section) Acute Rehab PT Goals ?PT Goal Formulation: Patient unable to participate in goal setting ?Time For Goal Achievement: 10/23/21 ?Potential to Achieve Goals: Good ?Progress towards PT goals: Progressing toward goals ? ?  ?Frequency ? ? ? Min 2X/week ? ? ? ?  ?PT Plan Discharge plan needs to be updated  ? ? ?Co-evaluation PT/OT/SLP Co-Evaluation/Treatment: Yes ?Reason for Co-Treatment: Necessary to address cognition/behavior during functional activity;For patient/therapist safety;To address functional/ADL transfers ?PT goals addressed during session: Mobility/safety with mobility;Balance;Strengthening/ROM ?OT goals addressed during session: ADL's and self-care;Strengthening/ROM ?  ? ?  ?AM-PAC PT "6 Clicks" Mobility   ?Outcome Measure ? Help needed turning from your back to your side while in a flat bed without using bedrails?: A Little ?Help needed moving from lying on your back to sitting on the side of a flat bed without using bedrails?: A Little ?Help needed moving to and from a bed to a chair (including a wheelchair)?: A Little ?Help needed standing up from a chair using your arms (e.g., wheelchair or  bedside chair)?: A Little ?Help needed to walk in hospital room?: A Lot ?Help needed climbing 3-5 steps with a railing? : A Lot ?6 Click Score: 16 ? ?  ?End of Session Equipment Utilized During Treatment: Gait belt ?Activity Tolerance: Patient tolerated treatment well ?Patient left: in chair;with call bell/phone within reach;with chair alarm set;with nursing/sitter in room ?Nurse Communication: Mobility status ?PT Visit Diagnosis: Unsteadiness on feet (R26.81);Muscle weakness (generalized) (M62.81) ?  ? ? ?Time: 6546-5035 ?PT Time Calculation (min) (ACUTE ONLY): 13 min ? ?Charges:  $Therapeutic Exercise: 8-22 mins          ?          ? ?Gwenlyn Saran, PT, DPT ?10/14/21, 4:13 PM ? ? ? ?Todd Brown ?10/14/2021, 4:07 PM ? ?

## 2021-10-14 NOTE — Progress Notes (Addendum)
?PROGRESS NOTE ? ? ? ?Todd Brown  XBJ:[id number] DOB: 11-Apr-1961 DOA: 10/07/2021 ?PCP: Jodi Marble, MD  ? ? ?Brief Narrative:  ?This 61 year old Male with medical history of HTN, ESRD on HD via left AV fistula, schizophrenia, GERD, muscle spasm came to the ED with complaints of altered mental status and high blood pressure.  Initially patient's systolic blood pressure was greater than 200.  CT of the head was negative.  He was diagnosed with hypertensive urgency,  started on home medications and briefly on Cardene drip.  Nephrology was consulted for hemodialysis.  PT is recommending SNF. ? ? ?Assessment & Plan: ?  ?Principal Problem: ?  Hypertensive urgency ?Active Problems: ?  Schizophrenia ([place]) ?  Essential hypertension, benign ?  End stage renal disease ([place]) ?  Dialysis patient [facility]) ?  GERD (gastroesophageal reflux disease) ?  Altered mental status ? ? ?* Hypertensive urgency > Improved ?Blood pressure remained elevated.   ?Losartan increased to 100 mg daily, hydralazine changed to 100 mg 3 times daily.   ?D/c'd Norvasc 10 mg daily.  ?5/12 started nifedipine 90 mg p.o. OD by nephro ?5/13 started Imdur 60 mg p.o. daily for persistent high blood pressure ?Blood pressure has now improved. ?Continue monitor BP and titrate medications accordingly. ?  ?Altered mental status, appears to be at baseline. ?Patient resides at [facility].  Ammonia level wnl,  normal B12, elevated. ?Still appears confused but following commands. ?  ?GERD (gastroesophageal reflux disease) ?Continue PPI ?  ?End stage renal disease ([place]) ?Nephrology consulted for hemodialysis.  Continue hemodialysis as per schedule. ?Next hemodialysis 10/15/2021. ?  ?Essential hypertension, benign ?Continue blood pressure medications as above. ?  ?Schizophrenia ([place]) ?History of garbled and pressured speech with possible cognitive impairment per psychiatry note on 10/04/2019. ?Continue fluphenazine 2.5 mg daily, trihexyphenidyl 2 mg daily,  carbamazepine 1000 mg q. every morning. ?  ? ?DVT prophylaxis: Heparin subcu ?Code Status: Full code ?Family Communication: No family at bedside. ?Disposition Plan:  ?Admitted for altered mentation.  CT head unremarkable , continued hemodialysis as per schedule. ?Patient is not medically clear, not back to baseline yet. ? ?Anticipated discharge to ALF on 10/15/2021. ? ?Consultants:  ?Nephrology  ? ?Procedures: Scheduled hemodialysis ? ?Antimicrobials: None. ? ?Subjective: ?Patient was seen and examined at the dialysis suite.  Overnight events noted. ?Patient appears more sleepy today than yesterday. ?He is alert, following partial commands and goes back to sleep. ? ?Objective: ?Vitals:  ? 10/14/21 0640 10/14/21 0853 10/14/21 1012 10/14/21 1132  ?BP: (!) 181/91 (!) 208/91 (!) 153/92 132/87  ?Pulse:  60 67 60  ?Resp:  20 18 18   ?Temp:  (!) 97.5 ?F (36.4 ?C) 98.4 ?F (36.9 ?C) 97.6 ?F (36.4 ?C)  ?TempSrc:      ?SpO2:  100% 100% 97%  ?Weight:      ?Height:      ? ? ?Intake/Output Summary (Last 24 hours) at 10/14/2021 1402 ?Last data filed at 10/13/2021 1905 ?Gross per 24 hour  ?Intake 480 ml  ?Output --  ?Net 480 ml  ? ?Filed Weights  ? 10/13/21 0925 10/13/21 0930 10/13/21 1233  ?Weight: 71.2 kg 71.2 kg 70.8 kg  ? ? ?Examination: ? ?General exam: Appears chronically ill looking, comfortable, not in any acute distress. ?Respiratory system: CTA bilaterally, respiratory effort normal, no wheezing, no crackles. ?Cardiovascular system: S1 & S2 heard, regular rate and rhythm, no murmur. ?Gastrointestinal system: Abdomen is soft, non tender, nondistended, BS +. ?Central nervous system: Alert and oriented x  1. No focal neurological deficits. ?Extremities:.  No edema, no cyanosis, no clubbing ?Skin: No rashes, lesions or ulcers ?Psychiatry: Poor judgment and insight. ? ? ? ?Data Reviewed: I have personally reviewed following labs and imaging studies ? ?CBC: ?Recent Labs  ?Lab 10/07/21 ?1948 10/13/21 ?0444 10/14/21 ?6283  ?WBC 4.2  4.6 3.9*  ?NEUTROABS 2.5  --   --   ?HGB 10.4* 9.7* 9.6*  ?HCT 31.0* 28.0* 28.5*  ?MCV 98.4 98.6 99.0  ?PLT 188 179 168  ? ?Basic Metabolic Panel: ?Recent Labs  ?Lab 10/10/21 ?6629 10/11/21 ?4765 10/12/21 ?4650 10/13/21 ?0444 10/14/21 ?3546  ?NA 134* 135 131* 131* 131*  ?K 4.3 4.1 4.7 5.7* 5.1  ?CL 95* 99 90* 91* 93*  ?CO2 31 30 29 28 28   ?GLUCOSE 77 113* 87 80 77  ?BUN 35* 23* 35* 48* 36*  ?CREATININE 6.63* 5.11* 6.45* 7.51* 5.39*  ?CALCIUM 9.4 9.4 9.3 9.3 9.2  ?MG 1.9 2.1 2.1 2.3 2.0  ?PHOS  --   --  3.2 3.2 3.2  ? ?GFR: ?Estimated Creatinine Clearance: 14.4 mL/min (A) (by C-G formula based on SCr of 5.39 mg/dL (H)). ?Liver Function Tests: ?Recent Labs  ?Lab 10/07/21 ?1948  ?AST 25  ?ALT 13  ?ALKPHOS 51  ?BILITOT 0.8  ?PROT 6.9  ?ALBUMIN 3.6  ? ?No results for input(s): LIPASE, AMYLASE in the last 168 hours. ?Recent Labs  ?Lab 10/08/21 ?0023  ?AMMONIA 27  ? ?Coagulation Profile: ?No results for input(s): INR, PROTIME in the last 168 hours. ?Cardiac Enzymes: ?No results for input(s): CKTOTAL, CKMB, CKMBINDEX, TROPONINI in the last 168 hours. ?BNP (last 3 results) ?No results for input(s): PROBNP in the last 8760 hours. ?HbA1C: ?No results for input(s): HGBA1C in the last 72 hours. ?CBG: ?Recent Labs  ?Lab 10/09/21 ?1216 10/09/21 ?1646 10/09/21 ?2014 10/09/21 ?2328 10/09/21 ?2332  ?GLUCAP 112* 88 97 94 92  ? ?Lipid Profile: ?No results for input(s): CHOL, HDL, LDLCALC, TRIG, CHOLHDL, LDLDIRECT in the last 72 hours. ?Thyroid Function Tests: ?No results for input(s): TSH, T4TOTAL, FREET4, T3FREE, THYROIDAB in the last 72 hours. ?Anemia Panel: ?No results for input(s): VITAMINB12, FOLATE, FERRITIN, TIBC, IRON, RETICCTPCT in the last 72 hours. ?Sepsis Labs: ?No results for input(s): PROCALCITON, LATICACIDVEN in the last 168 hours. ? ?Recent Results (from the past 240 hour(s))  ?Resp Panel by RT-PCR (Flu A&B, Covid) Nasopharyngeal Swab     Status: None  ? Collection Time: 10/07/21  7:48 PM  ? Specimen: Nasopharyngeal  Swab; Nasopharyngeal(NP) swabs in vial transport medium  ?Result Value Ref Range Status  ? SARS Coronavirus 2 by RT PCR NEGATIVE NEGATIVE Final  ?  Comment: (NOTE) ?SARS-CoV-2 target nucleic acids are NOT DETECTED. ? ?The SARS-CoV-2 RNA is generally detectable in upper respiratory ?specimens during the acute phase of infection. The lowest ?concentration of SARS-CoV-2 viral copies this assay can detect is ?138 copies/mL. A negative result does not preclude SARS-Cov-2 ?infection and should not be used as the sole basis for treatment or ?other patient management decisions. A negative result may occur with  ?improper specimen collection/handling, submission of specimen other ?than nasopharyngeal swab, presence of viral mutation(s) within the ?areas targeted by this assay, and inadequate number of viral ?copies(<138 copies/mL). A negative result must be combined with ?clinical observations, patient history, and epidemiological ?information. The expected result is Negative. ? ?Fact Sheet for Patients:  ?EntrepreneurPulse.com.au ? ?Fact Sheet for Healthcare Providers:  ?IncredibleEmployment.be ? ?This test is no t yet approved or cleared by the [place]  FDA and  ?has been authorized for detection and/or diagnosis of SARS-CoV-2 by ?FDA under an Emergency Use Authorization (EUA). This EUA will remain  ?in effect (meaning this test can be used) for the duration of the ?COVID-19 declaration under Section 564(b)(1) of the Act, 21 ?U.S.C.section 360bbb-3(b)(1), unless the authorization is terminated  ?or revoked sooner.  ? ? ?  ? Influenza A by PCR NEGATIVE NEGATIVE Final  ? Influenza B by PCR NEGATIVE NEGATIVE Final  ?  Comment: (NOTE) ?The Xpert Xpress SARS-CoV-2/FLU/RSV plus assay is intended as an aid ?in the diagnosis of influenza from Nasopharyngeal swab specimens and ?should not be used as a sole basis for treatment. Nasal washings and ?aspirates are unacceptable for Xpert Xpress  SARS-CoV-2/FLU/RSV ?testing. ? ?Fact Sheet for Patients: ?EntrepreneurPulse.com.au ? ?Fact Sheet for Healthcare Providers: ?IncredibleEmployment.be ? ?This test is not yet approved o

## 2021-10-14 NOTE — Progress Notes (Signed)
Occupational Therapy Treatment ?Patient Details ?Name: Todd Brown ?MRN: [id number] ?DOB: February 11, 1961 ?Today's Date: 10/14/2021 ? ? ?History of present illness Pt is a 61 year old male with history of hypertension, ESRD on HD via left AV fistula, schizophrenia, GERD, muscle spasms, who presents emergency department for chief concerns of altered mental status and high blood pressure. MD assessment includes: Hypertensive urgency and AMS. ?  ?OT comments ? Chart reviewed, RN cleared pt for participation in OT tx session. Cotx completed with PT on this date as pt required significantly more assist in previous tx sessions. Tx session targeted progressing activity tolerance for improved independence and safety during ADL task completion. Improvements noted in bed mobility with pt performing with supervision with [facility] raised, STS with CGA, step pivot transfer to chair with CGA+2. Step by step vcs required throughout for sequencing and safety. Pt required MIN A for self feeding due to perceived visual deficits and motor planning impairments with scooping food onto utensil. This therapist spoke with [place] from ALF with education provided re: recommendations. Pt appears to be progressing towards baseline level of functioning however will continue to benefit from skilled OT to address functional deficits. Pt is left in bedside chair, NAD, all needs met. Discharge recommendation has been updated to [facility], OT will continue to follow acutely.   ? ?Recommendations for follow up therapy are one component of a multi-disciplinary discharge planning process, led by the attending physician.  Recommendations may be updated based on patient status, additional functional criteria and insurance authorization. ?   ?Follow Up Recommendations ? Home health OT  ?  ?Assistance Recommended at Discharge Frequent or constant Supervision/Assistance  ?Patient can return home with the following ? Help with stairs or ramp for entrance;Direct  supervision/assist for medications management;Assistance with cooking/housework;Assist for transportation;Direct supervision/assist for financial management;A little help with walking and/or transfers;A lot of help with bathing/dressing/bathroom ?  ?Equipment Recommendations ? BSC/3in1  ?  ?Recommendations for Other Services   ? ?  ?Precautions / Restrictions Precautions ?Precautions: Fall ?Precaution Comments: LUE fistula ?Restrictions ?Weight Bearing Restrictions: No ?Other Position/Activity Restrictions: LUE fistula  ? ? ?  ? ?Mobility Bed Mobility ?Overal bed mobility: Needs Assistance ?Bed Mobility: Supine to Sit ?  ?  ?Supine to sit: Supervision, HOB elevated ?  ?  ?  ?  ? ?Transfers ?Overall transfer level: Needs assistance ?Equipment used: Rolling walker (2 wheels) ?Transfers: Sit to/from Stand ?Sit to Stand: Supervision, Min guard, +2 safety/equipment ?  ?  ?  ?  ?  ?  ?  ?  ?Balance Overall balance assessment: Needs assistance ?Sitting-balance support: Bilateral upper extremity supported, Feet supported ?Sitting balance-[name]: Fair ?Sitting balance - Comments: posterior LOB with dynamic sitting ?  ?Standing balance support: Bilateral upper extremity supported ?Standing balance-[name]: Fair ?  ?  ?  ?  ?  ?  ?  ?  ?  ?  ?  ?  ?   ? ?ADL either performed or assessed with clinical judgement  ? ?ADL Overall ADL's : Needs assistance/impaired ?Eating/Feeding: Sitting;Minimal assistance;Set up ?Eating/Feeding Details (indicate cue type and reason): pt with visual deficits, assist required for SET UP, scooping food ?Grooming: Wash/dry face;Sitting;Set up ?  ?  ?  ?  ?  ?  ?  ?  ?  ?Toilet Transfer: Min guard;+2 for safety/equipment;Rolling walker (2 wheels) ?Toilet Transfer Details (indicate cue type and reason): simulated, step pivot transfer to bedside chair ?  ?  ?  ?  ?  ?  ?  ? ?  Extremity/Trunk Assessment   ?  ?  ?  ?  ?  ? ?Vision   ?  ?  ?Perception   ?  ?Praxis   ?  ? ?Cognition  Arousal/Alertness: Awake/alert ?Behavior During Therapy: Flat affect ?Overall Cognitive Status: No family/caregiver present to determine baseline cognitive functioning ?Area of Impairment: Orientation, Memory, Following commands, Attention, Awareness, Safety/judgement, Problem solving ?  ?  ?  ?  ?  ?  ?  ?  ?Orientation Level: Disoriented to, Place, Time, Situation ("I'm at rehab") ?Current Attention Level: Sustained ?Memory: Decreased short-term memory ?Following Commands: Follows one step commands with increased time ?Safety/Judgement: Decreased awareness of safety ?Awareness: Intellectual ?Problem Solving: Difficulty sequencing, Requires verbal cues, Requires tactile cues ?  ?  ?  ?   ?Exercises   ? ?  ?Shoulder Instructions   ? ? ?  ?General Comments    ? ? ?Pertinent Vitals/ Pain       Pain Assessment ?Pain Assessment: No/denies pain ? ?Home Living   ?  ?  ?  ?  ?  ?  ?  ?  ?  ?  ?  ?  ?  ?  ?  ?  ?  ?  ? ?  ?Prior Functioning/Environment    ?  ?  ?  ?   ? ?Frequency ? Min 2X/week  ? ? ? ? ?  ?Progress Toward Goals ? ?OT Goals(current goals can now be found in the care plan section) ? Progress towards OT goals: Progressing toward goals ? ?   ?Plan Discharge plan needs to be updated   ? ?Co-evaluation ? ? ?   ?Reason for Co-Treatment: Necessary to address cognition/behavior during functional activity;For patient/therapist safety;To address functional/ADL transfers ?PT goals addressed during session: Mobility/safety with mobility;Balance;Strengthening/ROM ?OT goals addressed during session: ADL's and self-care;Strengthening/ROM ?  ? ?  ?AM-PAC OT "6 Clicks" Daily Activity     ?Outcome Measure ? ? Help from another person eating meals?: A Little ?Help from another person taking care of personal grooming?: A Little ?Help from another person toileting, which includes using toliet, bedpan, or urinal?: A Lot ?Help from another person bathing (including washing, rinsing, drying)?: A Little ?Help from another person to  put on and taking off regular upper body clothing?: A Little ?Help from another person to put on and taking off regular lower body clothing?: A Lot ?6 Click Score: 16 ? ?  ?End of Session Equipment Utilized During Treatment: Gait belt;Rolling walker (2 wheels) ? ?OT Visit Diagnosis: Muscle weakness (generalized) (M62.81);Unsteadiness on feet (R26.81) ?  ?Activity Tolerance Patient tolerated treatment well ?  ?Patient Left in chair;with call bell/phone within reach;with chair alarm set;with nursing/sitter in room ?  ?Nurse Communication Mobility status;Other (comment) (needs different call button due to visual deficit) ?  ? ?   ? ?Time: 2130-8657 ?OT Time Calculation (min): 25 min ? ?Charges: OT General Charges ?$OT Visit: 1 Visit ?OT Treatments ?$Self Care/Home Management : 8-22 mins ? ?Shanon Payor, OTD OTR/L  ?10/14/21, 4:01 PM  ?

## 2021-10-15 DIAGNOSIS — I160 Hypertensive urgency: Secondary | ICD-10-CM | POA: Diagnosis not present

## 2021-10-15 LAB — CBC
HCT: 29.7 % — ABNORMAL LOW (ref 39.0–52.0)
Hemoglobin: 10.2 g/dL — ABNORMAL LOW (ref 13.0–17.0)
MCH: 34.1 pg — ABNORMAL HIGH (ref 26.0–34.0)
MCHC: 34.3 g/dL (ref 30.0–36.0)
MCV: 99.3 fL (ref 80.0–100.0)
Platelets: 169 10*3/uL (ref 150–400)
RBC: 2.99 MIL/uL — ABNORMAL LOW (ref 4.22–5.81)
RDW: 16.1 % — ABNORMAL HIGH (ref 11.5–15.5)
WBC: 3.9 10*3/uL — ABNORMAL LOW (ref 4.0–10.5)
nRBC: 0 % (ref 0.0–0.2)

## 2021-10-15 LAB — POTASSIUM: Potassium: 4.4 mmol/L (ref 3.5–5.1)

## 2021-10-15 LAB — BASIC METABOLIC PANEL
Anion gap: 11 (ref 5–15)
BUN: 52 mg/dL — ABNORMAL HIGH (ref 8–23)
CO2: 26 mmol/L (ref 22–32)
Calcium: 9.4 mg/dL (ref 8.9–10.3)
Chloride: 88 mmol/L — ABNORMAL LOW (ref 98–111)
Creatinine, Ser: 6.41 mg/dL — ABNORMAL HIGH (ref 0.61–1.24)
GFR, Estimated: 9 mL/min — ABNORMAL LOW (ref >60)
Glucose, Bld: 82 mg/dL (ref 70–99)
Potassium: 6 mmol/L — ABNORMAL HIGH (ref 3.5–5.1)
Sodium: 125 mmol/L — ABNORMAL LOW (ref 135–145)

## 2021-10-15 LAB — MAGNESIUM: Magnesium: 2.2 mg/dL (ref 1.7–2.4)

## 2021-10-15 LAB — PHOSPHORUS: Phosphorus: 3.4 mg/dL (ref 2.5–4.6)

## 2021-10-15 MED ORDER — EPOETIN ALFA 4000 UNIT/ML IJ SOLN
INTRAMUSCULAR | Status: AC
  Filled 2021-10-15: qty 1

## 2021-10-15 MED ORDER — HEPARIN SODIUM (PORCINE) 1000 UNIT/ML IJ SOLN
INTRAMUSCULAR | Status: AC
  Filled 2021-10-15: qty 10

## 2021-10-15 NOTE — Progress Notes (Signed)
Report given to Dialysis RN at this time.  Patient finished eating breakfast and transport taking patient down to Hemodialysis at this time.  No s/s of distress noted denies pain. ?

## 2021-10-15 NOTE — Progress Notes (Signed)
?[facility]  ?ROUNDING NOTE  ? ?Subjective:  ? ?Patient is a 61 year old male with a history of hypertension, ESRD on HD, schizophrenia, GERD, muscle spasm admitted due to altered mental status and high blood pressure.  ? ?Update ?Patient seen and evaluated during dialysis  ?  ?HEMODIALYSIS FLOWSHEET: ? ?Blood Flow Rate (mL/min): 400 mL/min ?Arterial Pressure (mmHg): -170 mmHg ?Venous Pressure (mmHg): 140 mmHg ?Transmembrane Pressure (mmHg): 60 mmHg ?Ultrafiltration Rate (mL/min): 670 mL/min ?Dialysate Flow Rate (mL/min): 600 ml/min ?Conductivity: Machine : 13.7 ?Conductivity: Machine : 13.7 ?Dialysis Fluid Bolus: Normal Saline ?Bolus Amount (mL): 250 mL ?Dialysate Change: 2K ? ?No complaints at this time ? ?Objective:  ?Vital signs in last 24 hours:  ?Temp:  [97.4 ?F (36.3 ?C)-98.1 ?F (36.7 ?C)] 97.4 ?F (36.3 ?C) (05/17 1242) ?Pulse Rate:  [58-87] 70 (05/17 1242) ?Resp:  [15-26] 25 (05/17 0945) ?BP: (97-217)/(60-130) 109/75 (05/17 1242) ?SpO2:  [93 %-99 %] 97 % (05/17 1242) ?Weight:  [73.8 kg-75.5 kg] 73.8 kg (05/17 1242) ? ?Weight change:  ?Filed Weights  ? 10/13/21 1233 10/15/21 0927 10/15/21 1242  ?Weight: 70.8 kg 75.5 kg 73.8 kg  ? ? ?Intake/Output: ?I/O last 3 completed shifts: ?In: 66 [P.O.:510] ?Out: -  ?  ?Intake/Output this shift: ? Total I/O ?In: 240 [P.O.:240] ?Out: 1500 [Other:1500] ? ?Physical Exam: ?General: No acute distress  ?Head: Normocephalic, atraumatic. Moist oral mucosal membranes  ?Eyes: Anicteric  ?Neck: Supple  ?Lungs:  Clear to auscultation, normal effort  ?Heart: S1S2 no rubs  ?Abdomen:  Soft, nontender, bowel sounds present  ?Extremities:  No edema or cyanosis.  ?Neurologic: Somnolent, following simple commands  ?Skin: No acute rash  ?Access: R Permacath and LUE AVF aneurysm present  ? ? ?Basic Metabolic Panel: ?Recent Labs  ?Lab 10/11/21 ?3536 10/12/21 ?1443 10/13/21 ?0444 10/14/21 ?1540 10/15/21 ?0867  ?NA 135 131* 131* 131* 125*  ?K 4.1 4.7 5.7* 5.1 6.0*  ?CL 99 90* 91*  93* 88*  ?CO2 30 29 28 28 26   ?GLUCOSE 113* 87 80 77 82  ?BUN 23* 35* 48* 36* 52*  ?CREATININE 5.11* 6.45* 7.51* 5.39* 6.41*  ?CALCIUM 9.4 9.3 9.3 9.2 9.4  ?MG 2.1 2.1 2.3 2.0 2.2  ?PHOS  --  3.2 3.2 3.2 3.4  ? ? ? ?Liver Function Tests: ?No results for input(s): AST, ALT, ALKPHOS, BILITOT, PROT, ALBUMIN in the last 168 hours. ? ?No results for input(s): LIPASE, AMYLASE in the last 168 hours. ?No results for input(s): AMMONIA in the last 168 hours. ? ? ?CBC: ?Recent Labs  ?Lab 10/13/21 ?0444 10/14/21 ?6195 10/15/21 ?0932  ?WBC 4.6 3.9* 3.9*  ?HGB 9.7* 9.6* 10.2*  ?HCT 28.0* 28.5* 29.7*  ?MCV 98.6 99.0 99.3  ?PLT 179 168 169  ? ? ? ?Cardiac Enzymes: ?No results for input(s): CKTOTAL, CKMB, CKMBINDEX, TROPONINI in the last 168 hours. ? ?BNP: ?Invalid input(s): POCBNP ? ?CBG: ?Recent Labs  ?Lab 10/09/21 ?1216 10/09/21 ?1646 10/09/21 ?2014 10/09/21 ?2328 10/09/21 ?2332  ?GLUCAP 112* 88 97 94 92  ? ? ? ?Microbiology: ?Results for orders placed or performed during the hospital encounter of 10/07/21  ?Resp Panel by RT-PCR (Flu A&B, Covid) Nasopharyngeal Swab     Status: None  ? Collection Time: 10/07/21  7:48 PM  ? Specimen: Nasopharyngeal Swab; Nasopharyngeal(NP) swabs in vial transport medium  ?Result Value Ref Range Status  ? SARS Coronavirus 2 by RT PCR NEGATIVE NEGATIVE Final  ?  Comment: (NOTE) ?SARS-CoV-2 target nucleic acids are NOT DETECTED. ? ?The SARS-CoV-2 RNA  is generally detectable in upper respiratory ?specimens during the acute phase of infection. The lowest ?concentration of SARS-CoV-2 viral copies this assay can detect is ?138 copies/mL. A negative result does not preclude SARS-Cov-2 ?infection and should not be used as the sole basis for treatment or ?other patient management decisions. A negative result may occur with  ?improper specimen collection/handling, submission of specimen other ?than nasopharyngeal swab, presence of viral mutation(s) within the ?areas targeted by this assay, and inadequate  number of viral ?copies(<138 copies/mL). A negative result must be combined with ?clinical observations, patient history, and epidemiological ?information. The expected result is Negative. ? ?Fact Sheet for Patients:  ?EntrepreneurPulse.com.au ? ?Fact Sheet for Healthcare Providers:  ?IncredibleEmployment.be ? ?This test is no t yet approved or cleared by the [place] FDA and  ?has been authorized for detection and/or diagnosis of SARS-CoV-2 by ?FDA under an Emergency Use Authorization (EUA). This EUA will remain  ?in effect (meaning this test can be used) for the duration of the ?COVID-19 declaration under Section 564(b)(1) of the Act, 21 ?U.S.C.section 360bbb-3(b)(1), unless the authorization is terminated  ?or revoked sooner.  ? ? ?  ? Influenza A by PCR NEGATIVE NEGATIVE Final  ? Influenza B by PCR NEGATIVE NEGATIVE Final  ?  Comment: (NOTE) ?The Xpert Xpress SARS-CoV-2/FLU/RSV plus assay is intended as an aid ?in the diagnosis of influenza from Nasopharyngeal swab specimens and ?should not be used as a sole basis for treatment. Nasal washings and ?aspirates are unacceptable for Xpert Xpress SARS-CoV-2/FLU/RSV ?testing. ? ?Fact Sheet for Patients: ?EntrepreneurPulse.com.au ? ?Fact Sheet for Healthcare Providers: ?IncredibleEmployment.be ? ?This test is not yet approved or cleared by the [place] FDA and ?has been authorized for detection and/or diagnosis of SARS-CoV-2 by ?FDA under an Emergency Use Authorization (EUA). This EUA will remain ?in effect (meaning this test can be used) for the duration of the ?COVID-19 declaration under Section 564(b)(1) of the Act, 21 U.S.C. ?section 360bbb-3(b)(1), unless the authorization is terminated or ?revoked. ? ?Performed at [facility], [place], ?[place] [address] ?  ? ? ?Coagulation Studies: ?No results for input(s): LABPROT, INR in the last 72  hours. ? ?Urinalysis: ?No results for input(s): COLORURINE, LABSPEC, [place], GLUCOSEU, HGBUR, BILIRUBINUR, KETONESUR, PROTEINUR, UROBILINOGEN, NITRITE, LEUKOCYTESUR in the last 72 hours. ? ?Invalid input(s): APPERANCEUR  ? ? ?Imaging: ?No results found. ? ? ?Medications:  ? ? ? calcium acetate  1,334 mg Oral TID WC  ? carbamazepine  1,000 mg Oral q AM  ? Chlorhexidine Gluconate Cloth  6 each Topical Q0600  ? cholecalciferol  1,000 Units Oral Daily  ? cloNIDine  0.3 mg Oral TID  ? epoetin alfa      ? epoetin (EPOGEN/PROCRIT) injection  3,000 Units Intravenous Q M,W,F-HD  ? ferrous sulfate  325 mg Oral Daily  ? fluPHENAZine  2.5 mg Oral Daily  ? heparin  5,000 Units Subcutaneous Q8H  ? heparin sodium (porcine)      ? hydrALAZINE  100 mg Oral Q8H  ? isosorbide mononitrate  60 mg Oral Daily  ? losartan  100 mg Oral Daily  ? mouth rinse  15 mL Mouth Rinse BID  ? mometasone-formoterol  2 puff Inhalation BID  ? multivitamin  1 tablet Oral QHS  ? NIFEdipine  90 mg Oral Daily  ? pantoprazole  80 mg Oral Daily  ? trihexyphenidyl  2 mg Oral BID  ? ?guaiFENesin, hydrALAZINE, ipratropium-albuterol, melatonin, metoprolol tartrate, ondansetron **OR** ondansetron (ZOFRAN) IV, polyethylene glycol,  senna-docusate, tiZANidine, traZODone ? ?Assessment/ Plan:  ?61 y.o. male with a history of hypertension, ESRD on HD, schizophrenia, GERD, muscle spasm admitted to the hospital due to altered mental status and high blood pressure.  Nephrology was consulted for comanagement of dialysis.  ? ?[facility]/MWF/Rt Permcath ? ?ESRD on hemodialysis Monday Wednesday Friday schedule.  Receiving dialysis today, UF goal 1.5L as tolerated. Next treatment scheduled for Friday ? ?Hypertensive urgency.  Status post adjustment of antihypertensive regimen.  Blood pressure 127/74 during dialysis ? ?Anemia of chronic disease.  Hgb improved to 10.2. Continue to monitor H&H.   ? ?Secondary hyperparathyroidism. Calcium and phosphorus stable. Will  continue to montior ? ?Altered mental status/schizophrenia.  History of garbled/pressured speech possible cognitive impairment as per psych.   ? ? LOS: 7 ?[place] ?5/17/20231:02 PM ?  ?

## 2021-10-15 NOTE — Progress Notes (Signed)
[place] at [facility] ? ? ?PATIENT NAME: Todd Brown   ? ?MR#:  [id number] ? ?DATE OF BIRTH:  28-Apr-1961 ? ?SUBJECTIVE:  ?patient just got back from dialysis. No family at bedside. ?Tolerating pur?ed diet. Blood pressure much improved. ? ? ?VITALS:  ?Blood pressure 135/77, pulse 63, temperature 97.8 ?F (36.6 ?C), temperature source Oral, resp. rate 16, height 6\' 4"  (1.93 m), weight 73.8 kg, SpO2 97 %. ? ?PHYSICAL EXAMINATION:  ? ?GENERAL:  61 y.o.-year-old patient lying in the bed with no acute distress. Appears chronically ill. ?LUNGS: Normal breath sounds bilaterally, no wheezing, rales, rhonchi.  ?CARDIOVASCULAR: S1, S2 normal. No murmurs, rubs, or gallops.  ?ABDOMEN: Soft, nontender, nondistended. Bowel sounds present.  ?EXTREMITIES: No  edema b/l.    ?NEUROLOGIC: nonfocal  patient is alert not much communicative ? ? ?LABORATORY PANEL:  ?CBC ?Recent Labs  ?Lab 10/15/21 ?0705  ?WBC 3.9*  ?HGB 10.2*  ?HCT 29.7*  ?PLT 169  ? ? ?Chemistries  ?Recent Labs  ?Lab 10/15/21 ?0705  ?NA 125*  ?K 6.0*  ?CL 88*  ?CO2 26  ?GLUCOSE 82  ?BUN 52*  ?CREATININE 6.41*  ?CALCIUM 9.4  ?MG 2.2  ? ? ? ?Assessment and Plan ?61 year old Male with medical history of HTN, ESRD on HD via left AV fistula, schizophrenia, GERD, muscle spasm came to the ED with complaints of altered mental status and high blood pressure.  Initially patient's systolic blood pressure was greater than 200.  CT of the head was negative.  He was diagnosed with hypertensive urgency,  started on home medications and briefly on Cardene drip.  Nephrology was consulted for hemodialysis.   ?  ?Hypertensive urgency > Improved ?Blood pressure has now improved. ?Continue monitor BP and titrate medications accordingly. ?  ?Altered mental status, appears to be at baseline. ?--Patient resides at [place] [facility].  Ammonia level wnl,  normal B12, elevated. ?--Still appears confused but following commands. ?  ?GERD (gastroesophageal reflux  disease) ?-Continue PPI ?  ?End stage renal disease ([place]) ?hyperkalemia ?--Nephrology consulted for hemodialysis.  Continue hemodialysis as per schedule. ?--place on renal diet ?--K 6.0--got HD today. Check potassium and treat accordingly ? ?Essential hypertension, benign ?--Continue blood pressure medications as above. ?  ?Schizophrenia ([place]) ?--History of garbled and pressured speech with possible cognitive impairment per psychiatry note on 10/04/2019. ?--Continue fluphenazine , trihexyphenidyl, carbamazepine   ? ? ? ?Procedures: ?Family communication :none ?Consults : nephrology ?CODE STATUS: full ?DVT Prophylaxis : heparin ?Level of care: Progressive ?Status is: Inpatient ?Remains inpatient appropriate because: elevated potassium, dialysis. If blood pressure remains stable and potassium is normalized will discharged to [place] [facility] tomorrow. TOC aware ?  ? ?TOTAL TIME TAKING CARE OF THIS PATIENT: 30 minutes.  ?>50% time spent on counselling and coordination of care ? ?Note: This dictation was prepared with Dragon dictation along with smaller phrase technology. Any transcriptional errors that result from this process are unintentional. ? ?Fritzi Mandes M.D  ? ? ?[facility]  ? ?CC: ?Primary care physician; Jodi Marble, MD  ?

## 2021-10-15 NOTE — Progress Notes (Signed)
PT Cancellation Note ? ?Patient Details ?Name: Todd Brown ?MRN: [id number] ?DOB: 06-Feb-1961 ? ? ?Cancelled Treatment:   PT attempt. Pt is currently off floor in HD. Will return when pt is available. PT will continue to follow pt per current POC.    ? ? ?Willette Pa ?10/15/2021, 10:12 AM ?

## 2021-10-16 DIAGNOSIS — I160 Hypertensive urgency: Secondary | ICD-10-CM | POA: Diagnosis not present

## 2021-10-16 LAB — BASIC METABOLIC PANEL
Anion gap: 11 (ref 5–15)
BUN: 36 mg/dL — ABNORMAL HIGH (ref 8–23)
CO2: 27 mmol/L (ref 22–32)
Calcium: 9.3 mg/dL (ref 8.9–10.3)
Chloride: 91 mmol/L — ABNORMAL LOW (ref 98–111)
Creatinine, Ser: 4.74 mg/dL — ABNORMAL HIGH (ref 0.61–1.24)
GFR, Estimated: 13 mL/min — ABNORMAL LOW (ref >60)
Glucose, Bld: 71 mg/dL (ref 70–99)
Potassium: 5.3 mmol/L — ABNORMAL HIGH (ref 3.5–5.1)
Sodium: 129 mmol/L — ABNORMAL LOW (ref 135–145)

## 2021-10-16 MED ORDER — HYDRALAZINE HCL 100 MG PO TABS: 100.0000 mg | ORAL_TABLET | Freq: Three times a day (TID) | ORAL | Status: DC

## 2021-10-16 MED ORDER — NIFEDIPINE ER OSMOTIC RELEASE 90 MG PO TB24: 90.0000 mg | ORAL_TABLET | Freq: Every day | ORAL | 0 refills | Status: DC

## 2021-10-16 MED ORDER — PATIROMER SORBITEX CALCIUM 8.4 G PO PACK
16.8000 g | PACK | Freq: Once | ORAL | Status: DC
  Filled 2021-10-16: qty 2

## 2021-10-16 MED ORDER — LOSARTAN POTASSIUM 100 MG PO TABS: 100.0000 mg | ORAL_TABLET | Freq: Every day | ORAL | 0 refills | Status: DC

## 2021-10-16 MED ORDER — ISOSORBIDE MONONITRATE ER 60 MG PO TB24: 60.0000 mg | ORAL_TABLET | Freq: Two times a day (BID) | ORAL | 1 refills | Status: DC

## 2021-10-16 MED ORDER — POLYETHYLENE GLYCOL 3350 17 G PO PACK: 17.0000 g | PACK | Freq: Every day | ORAL | 0 refills | Status: DC | PRN

## 2021-10-16 NOTE — Progress Notes (Addendum)
[facility]  ROUNDING NOTE   Subjective:   Patient is a 61 year old male with a history of hypertension, ESRD on HD, schizophrenia, GERD, muscle spasm admitted due to altered mental status and high blood pressure.   Update  Patient sitting up in bed States he's eating well Denies nausea and vomiting  Objective:  Vital signs in last 24 hours:  Temp:  [97.7 F (36.5 C)-98.5 F (36.9 C)] 98.5 F (36.9 C) (05/18 1129) Pulse Rate:  [58-68] 68 (05/18 1129) Resp:  [13-20] 18 (05/18 1129) BP: (135-210)/(77-96) 144/82 (05/18 1129) SpO2:  [95 %-98 %] 98 % (05/18 1129)  Weight change:  Filed Weights   10/13/21 1233 10/15/21 0927 10/15/21 1242  Weight: 70.8 kg 75.5 kg 73.8 kg    Intake/Output: I/O last 3 completed shifts: In: 700 [P.O.:700] Out: 1500 [Other:1500]   Intake/Output this shift:  Total I/O In: 480 [P.O.:480] Out: -   Physical Exam: General: No acute distress  Head: Normocephalic, atraumatic. Moist oral mucosal membranes  Eyes: Anicteric  Lungs:  Clear to auscultation, normal effort  Heart: S1S2 no rubs  Abdomen:  Soft, nontender, bowel sounds present  Extremities:  No edema or cyanosis.  Neurologic: Somnolent, following simple commands  Skin: No acute rash  Access: R Permacath and LUE AVF aneurysm present    Basic Metabolic Panel: Recent Labs  Lab 10/11/21 0520 10/12/21 0453 10/13/21 0444 10/14/21 0517 10/15/21 0705 10/15/21 1705 10/16/21 0646  NA 135 131* 131* 131* 125*  --  129*  K 4.1 4.7 5.7* 5.1 6.0* 4.4 5.3*  CL 99 90* 91* 93* 88*  --  91*  CO2 30 29 28 28 26   --  27  GLUCOSE 113* 87 80 77 82  --  71  BUN 23* 35* 48* 36* 52*  --  36*  CREATININE 5.11* 6.45* 7.51* 5.39* 6.41*  --  4.74*  CALCIUM 9.4 9.3 9.3 9.2 9.4  --  9.3  MG 2.1 2.1 2.3 2.0 2.2  --   --   PHOS  --  3.2 3.2 3.2 3.4  --   --      Liver Function Tests: No results for input(s): AST, ALT, ALKPHOS, BILITOT, PROT, ALBUMIN in the last 168 hours.  No results  for input(s): LIPASE, AMYLASE in the last 168 hours. No results for input(s): AMMONIA in the last 168 hours.   CBC: Recent Labs  Lab 10/13/21 0444 10/14/21 0517 10/15/21 0705  WBC 4.6 3.9* 3.9*  HGB 9.7* 9.6* 10.2*  HCT 28.0* 28.5* 29.7*  MCV 98.6 99.0 99.3  PLT 179 168 169     Cardiac Enzymes: No results for input(s): CKTOTAL, CKMB, CKMBINDEX, TROPONINI in the last 168 hours.  BNP: Invalid input(s): POCBNP  CBG: Recent Labs  Lab 10/09/21 1646 10/09/21 2014 10/09/21 2328 10/09/21 2332  GLUCAP 88 97 94 92     Microbiology: Results for orders placed or performed during the hospital encounter of 10/07/21  Resp Panel by RT-PCR (Flu A&B, Covid) Nasopharyngeal Swab     Status: None   Collection Time: 10/07/21  7:48 PM   Specimen: Nasopharyngeal Swab; Nasopharyngeal(NP) swabs in vial transport medium  Result Value Ref Range Status   SARS Coronavirus 2 by RT PCR NEGATIVE NEGATIVE Final    Comment: (NOTE) SARS-CoV-2 target nucleic acids are NOT DETECTED.  The SARS-CoV-2 RNA is generally detectable in upper respiratory specimens during the acute phase of infection. The lowest concentration of SARS-CoV-2 viral copies this assay can detect is 138  copies/mL. A negative result does not preclude SARS-Cov-2 infection and should not be used as the sole basis for treatment or other patient management decisions. A negative result may occur with  improper specimen collection/handling, submission of specimen other than nasopharyngeal swab, presence of viral mutation(s) within the areas targeted by this assay, and inadequate number of viral copies(<138 copies/mL). A negative result must be combined with clinical observations, patient history, and epidemiological information. The expected result is Negative.  Fact Sheet for Patients:  EntrepreneurPulse.com.au  Fact Sheet for Healthcare Providers:  IncredibleEmployment.be  This test is no t  yet approved or cleared by the [place] FDA and  has been authorized for detection and/or diagnosis of SARS-CoV-2 by FDA under an Emergency Use Authorization (EUA). This EUA will remain  in effect (meaning this test can be used) for the duration of the COVID-19 declaration under Section 564(b)(1) of the Act, 21 U.S.C.section 360bbb-3(b)(1), unless the authorization is terminated  or revoked sooner.       Influenza A by PCR NEGATIVE NEGATIVE Final   Influenza B by PCR NEGATIVE NEGATIVE Final    Comment: (NOTE) The Xpert Xpress SARS-CoV-2/FLU/RSV plus assay is intended as an aid in the diagnosis of influenza from Nasopharyngeal swab specimens and should not be used as a sole basis for treatment. Nasal washings and aspirates are unacceptable for Xpert Xpress SARS-CoV-2/FLU/RSV testing.  Fact Sheet for Patients: EntrepreneurPulse.com.au  Fact Sheet for Healthcare Providers: IncredibleEmployment.be  This test is not yet approved or cleared by the [place] FDA and has been authorized for detection and/or diagnosis of SARS-CoV-2 by FDA under an Emergency Use Authorization (EUA). This EUA will remain in effect (meaning this test can be used) for the duration of the COVID-19 declaration under Section 564(b)(1) of the Act, 21 U.S.C. section 360bbb-3(b)(1), unless the authorization is terminated or revoked.  Performed at [facility], [address] [place], [place] [address]     Coagulation Studies: No results for input(s): LABPROT, INR in the last 72 hours.  Urinalysis: No results for input(s): COLORURINE, LABSPEC, PHURINE, GLUCOSEU, HGBUR, BILIRUBINUR, KETONESUR, PROTEINUR, UROBILINOGEN, NITRITE, LEUKOCYTESUR in the last 72 hours.  Invalid input(s): APPERANCEUR    Imaging: No results found.   Medications:     calcium acetate  1,334 mg Oral TID WC   carbamazepine  1,000 mg Oral q AM   Chlorhexidine Gluconate Cloth   6 each Topical Q0600   cholecalciferol  1,000 Units Oral Daily   cloNIDine  0.3 mg Oral TID   epoetin (EPOGEN/PROCRIT) injection  3,000 Units Intravenous Q M,W,F-HD   ferrous sulfate  325 mg Oral Daily   fluPHENAZine  2.5 mg Oral Daily   heparin  5,000 Units Subcutaneous Q8H   hydrALAZINE  100 mg Oral Q8H   isosorbide mononitrate  60 mg Oral Daily   losartan  100 mg Oral Daily   mouth rinse  15 mL Mouth Rinse BID   mometasone-formoterol  2 puff Inhalation BID   multivitamin  1 tablet Oral QHS   NIFEdipine  90 mg Oral Daily   pantoprazole  80 mg Oral Daily   patiromer  16.8 g Oral Once   trihexyphenidyl  2 mg Oral BID   guaiFENesin, hydrALAZINE, ipratropium-albuterol, melatonin, ondansetron **OR** ondansetron (ZOFRAN) IV, polyethylene glycol, senna-docusate, tiZANidine, traZODone  Assessment/ Plan:  61 y.o. male with a history of hypertension, ESRD on HD, schizophrenia, GERD, muscle spasm admitted to the hospital due to altered mental status and high blood pressure.  Nephrology  was consulted for comanagement of dialysis.   [facility]/MWF/Rt Permcath  ESRD with hyperkalemia on hemodialysis Monday Wednesday Friday schedule.  Dialysis received yesterday, UF goal 1.5L achieved. Next treatment scheduled for Friday. Potassium 5.3, [place] 16.4g ordered.  Hypertensive urgency.  Status post adjustment of antihypertensive regimen.  Blood pressure 144/82  Anemia of chronic disease.  Hgb at goal. No need for ESA's at this time.   Secondary hyperparathyroidism. Will continue to monitor bone minerals during this admission. Continue calcium acetate with meals.  Altered mental status/schizophrenia.  History of garbled/pressured speech possible cognitive impairment as per psych.     LOS: 8   5/18/20231:07 PM

## 2021-10-16 NOTE — Plan of Care (Signed)
  Problem: Education: Goal: Knowledge of General Education information will improve Description: Including pain rating scale, medication(s)/side effects and non-pharmacologic comfort measures 10/16/2021 1155 by Orvan Seen, RN Outcome: Completed/Met 10/16/2021 1121 by Orvan Seen, RN Outcome: Progressing   Problem: Health Behavior/Discharge Planning: Goal: Ability to manage health-related needs will improve 10/16/2021 1155 by Orvan Seen, RN Outcome: Completed/Met 10/16/2021 1121 by Orvan Seen, RN Outcome: Progressing   Problem: Clinical Measurements: Goal: Ability to maintain clinical measurements within normal limits will improve 10/16/2021 1155 by Orvan Seen, RN Outcome: Completed/Met 10/16/2021 1121 by Orvan Seen, RN Outcome: Progressing Goal: Will remain free from infection 10/16/2021 1155 by Orvan Seen, RN Outcome: Completed/Met 10/16/2021 1121 by Orvan Seen, RN Outcome: Progressing Goal: Diagnostic test results will improve 10/16/2021 1155 by Orvan Seen, RN Outcome: Completed/Met 10/16/2021 1121 by Orvan Seen, RN Outcome: Progressing Goal: Respiratory complications will improve 10/16/2021 1155 by Orvan Seen, RN Outcome: Completed/Met 10/16/2021 1121 by Orvan Seen, RN Outcome: Progressing Goal: Cardiovascular complication will be avoided 10/16/2021 1155 by Orvan Seen, RN Outcome: Completed/Met 10/16/2021 1121 by Orvan Seen, RN Outcome: Progressing   Problem: Activity: Goal: Risk for activity intolerance will decrease 10/16/2021 1155 by Orvan Seen, RN Outcome: Completed/Met 10/16/2021 1121 by Orvan Seen, RN Outcome: Progressing   Problem: Nutrition: Goal: Adequate nutrition will be maintained 10/16/2021 1155 by Orvan Seen, RN Outcome: Completed/Met 10/16/2021 1121 by Orvan Seen, RN Outcome: Progressing   Problem: Coping: Goal: Level of anxiety will decrease 10/16/2021  1155 by Orvan Seen, RN Outcome: Completed/Met 10/16/2021 1121 by Orvan Seen, RN Outcome: Progressing   Problem: Elimination: Goal: Will not experience complications related to bowel motility 10/16/2021 1155 by Orvan Seen, RN Outcome: Completed/Met 10/16/2021 1121 by Orvan Seen, RN Outcome: Progressing Goal: Will not experience complications related to urinary retention 10/16/2021 1155 by Orvan Seen, RN Outcome: Completed/Met 10/16/2021 1121 by Orvan Seen, RN Outcome: Progressing   Problem: Pain Managment: Goal: General experience of comfort will improve 10/16/2021 1155 by Orvan Seen, RN Outcome: Completed/Met 10/16/2021 1121 by Orvan Seen, RN Outcome: Progressing   Problem: Safety: Goal: Ability to remain free from injury will improve 10/16/2021 1155 by Orvan Seen, RN Outcome: Completed/Met 10/16/2021 1121 by Orvan Seen, RN Outcome: Progressing   Problem: Skin Integrity: Goal: Risk for impaired skin integrity will decrease 10/16/2021 1155 by Orvan Seen, RN Outcome: Completed/Met 10/16/2021 1121 by Orvan Seen, RN Outcome: Progressing

## 2021-10-16 NOTE — TOC Progression Note (Signed)
Transition of Care [facility]) - Progression Note    Patient Details  Name: Todd Brown MRN: [id number] Date of Birth: 1961/05/13  Transition of Care [facility]) CM/SW Contact  Laurena Slimmer, RN Phone Number: 10/16/2021, 10:10 AM  Clinical Narrative:    Attempt to contact Marcelle Overlie at [facility] regarding discharge. No answer. Left message.         Expected Discharge Plan and Services           Expected Discharge Date: 10/16/21                                     Social Determinants of Health (SDOH) Interventions    Readmission Risk Interventions     View : No data to display.

## 2021-10-16 NOTE — Progress Notes (Addendum)
Patient discharged to [facility]. Patient wheeled out of unit with all belongings. No discomfort. Discharge instructions/package/prescriptions delivered to facility. All questions answered. PIV removed, no bleeding, intact.

## 2021-10-16 NOTE — TOC Progression Note (Addendum)
Transition of Care [facility]) - Progression Note    Patient Details  Name: Todd Brown MRN: [id number] Date of Birth: [birth year]  Transition of Care [facility]) CM/SW [place], [place] Phone Number: 10/16/2021, 11:48 AM  Clinical Narrative:        Patient to discharge back to [facility], [name] reports patient will be picked up in medical mall at 12:10 pm, treatment team updated. Discharge summary and fl2 provided in dc packet for [facility].    Patient's guardian Candice with DSS notified of dc in confidential vm. No further dc needs.     Expected Discharge Plan and Services           Expected Discharge Date: 10/16/21                                     Social Determinants of Health (SDOH) Interventions    Readmission Risk Interventions     View : No data to display.

## 2021-10-16 NOTE — NC FL2 (Signed)
[place] LEVEL OF CARE SCREENING TOOL     IDENTIFICATION  Patient Name: Todd Brown Birthdate: [birth year] Sex: male Admission Date (Current Location): 10/07/2021  [facility] and [place] Number:  Engineering geologist and Address:  [facility], [address] [place], [place] [address]      Provider Number: [id number]  Attending Physician Name and Address:  Fritzi Mandes, MD  Relative Name and Phone Number:  Candice [phone]    Current Level of Care: Hospital Recommended Level of Care: [place] [name]) Prior Approval Number:    Date Approved/Denied:   PASRR Number:    Discharge Plan: Other (Comment) [name])    Current Diagnoses: Patient Active Problem List   Diagnosis Date Noted   Altered mental status 10/08/2021   Hypertensive urgency 10/07/2021   COPD (chronic obstructive pulmonary disease) ([place]) 02/26/2021   GERD (gastroesophageal reflux disease) 02/26/2021   Hypertension 11/28/2020   Acute respiratory failure with hypoxia ([place]) 10/23/2019   Anemia of chronic disease 10/23/2019   Fluid overload 10/08/2018   Acute pulmonary edema ([place]) 09/12/2018   Scrotal edema 05/10/2017   Symptomatic anemia 04/30/2017   Anticoagulated on Coumadin 09/14/2016   Cardiac arrest ([place]) 08/03/2016   Sepsis ([place]) 05/26/2016   Dialysis patient ([place]) 04/15/2016   End stage renal disease ([place]) 02/12/2016   Hyperkalemia 05/20/2015   Hepatitis C 09/26/2013   Schizophrenia ([place]) 03/23/2013   Essential hypertension, benign 03/23/2013   Chronic kidney disease 03/23/2013    Orientation RESPIRATION BLADDER Height & Weight     Self  Normal Continent Weight: 162 lb 11.2 oz (73.8 kg) Height:  6\' 4"  (193 cm)  BEHAVIORAL SYMPTOMS/MOOD NEUROLOGICAL BOWEL NUTRITION STATUS      Continent Diet (low sodium heart healthy)  AMBULATORY STATUS COMMUNICATION OF NEEDS Skin   Limited Assist Verbally Normal                        Personal Care Assistance Level of Assistance  Bathing, Feeding, Dressing, Total care Bathing Assistance: Limited assistance Feeding assistance: Limited assistance Dressing Assistance: Limited assistance Total Care Assistance: Limited assistance   Functional Limitations Info  Sight, Hearing, Speech Sight Info: Impaired Hearing Info: Adequate Speech Info: Adequate    SPECIAL CARE FACTORS FREQUENCY                       Contractures Contractures Info: Not present    Additional Factors Info  Code Status, Allergies Code Status Info: full Allergies Info: chlorpromazine           Current Medications (10/16/2021):  This is the current hospital active medication list Current Facility-Administered Medications  Medication Dose Route Frequency Provider Last Rate Last Admin   calcium acetate (PHOSLO) capsule 1,334 mg  1,334 mg Oral TID WC Cox, Amy N, DO   1,334 mg at 10/16/21 0920   carbamazepine (TEGRETOL) chewable tablet 1,000 mg  1,000 mg Oral q AM Renda Rolls, RPH   1,000 mg at 10/16/21 0545   Chlorhexidine Gluconate Cloth 2 % PADS 6 each  6 each Topical Q0600 Liana Gerold, MD   6 each at 10/16/21 0546   cholecalciferol (VITAMIN D3) tablet 1,000 Units  1,000 Units Oral Daily Cox, Amy N, DO   1,000 Units at 10/16/21 0920   cloNIDine (CATAPRES) tablet 0.3 mg  0.3 mg Oral TID Cox, Amy N, DO   0.3 mg at 10/16/21 [phone]  epoetin alfa (EPOGEN) injection 3,000 Units  3,000 Units Intravenous Q M,W,F-HD Bhutani, Manpreet S, MD   3,000 Units at 10/15/21 1200   ferrous sulfate tablet 325 mg  325 mg Oral Daily Cox, Amy N, DO   325 mg at 10/16/21 0920   fluPHENAZine (PROLIXIN) tablet 2.5 mg  2.5 mg Oral Daily Cox, Amy N, DO   2.5 mg at 10/16/21 0920   guaiFENesin (ROBITUSSIN) 100 MG/5ML liquid 5 mL  5 mL Oral Q4H PRN Amin, Ankit Chirag, MD       heparin injection 5,000 Units  5,000 Units Subcutaneous Q8H Cox, Amy N, DO   5,000 Units at 10/16/21 0545   hydrALAZINE  (APRESOLINE) injection 10 mg  10 mg Intravenous Q4H PRN Amin, Ankit Chirag, MD   10 mg at 10/16/21 1050   hydrALAZINE (APRESOLINE) tablet 100 mg  100 mg Oral Q8H Bhutani, Manpreet S, MD   100 mg at 10/16/21 0544   ipratropium-albuterol (DUONEB) 0.5-2.5 (3) MG/3ML nebulizer solution 3 mL  3 mL Nebulization Q4H PRN Amin, Ankit Chirag, MD       isosorbide mononitrate (IMDUR) 24 hr tablet 60 mg  60 mg Oral Daily Val Riles, MD   60 mg at 10/16/21 0920   losartan (COZAAR) tablet 100 mg  100 mg Oral Daily Bhutani, Manpreet S, MD   100 mg at 10/16/21 0919   MEDLINE mouth rinse  15 mL Mouth Rinse BID Amin, Ankit Chirag, MD   15 mL at 10/16/21 8242   melatonin tablet 5 mg  5 mg Oral QHS PRN Cox, Amy N, DO   5 mg at 10/14/21 2204   mometasone-formoterol (DULERA) 200-5 MCG/ACT inhaler 2 puff  2 puff Inhalation BID Cox, Amy N, DO   2 puff at 10/16/21 3536   multivitamin (RENA-VIT) tablet 1 tablet  1 tablet Oral QHS Cox, Amy N, DO   1 tablet at 10/15/21 2215   NIFEdipine (PROCARDIA-XL/NIFEDICAL-XL) 24 hr tablet 90 mg  90 mg Oral Daily Bhutani, Manpreet S, MD   90 mg at 10/16/21 0923   ondansetron (ZOFRAN) tablet 4 mg  4 mg Oral Q6H PRN Cox, Amy N, DO       Or   ondansetron (ZOFRAN) injection 4 mg  4 mg Intravenous Q6H PRN Cox, Amy N, DO       pantoprazole (PROTONIX) EC tablet 80 mg  80 mg Oral Daily Cox, Amy N, DO   80 mg at 10/16/21 0920   patiromer (VELTASSA) packet 16.8 g  16.8 g Oral Once Colon Flattery, NP       polyethylene glycol (MIRALAX / GLYCOLAX) packet 17 g  17 g Oral Daily PRN Cox, Amy N, DO   17 g at 10/14/21 0915   senna-docusate (Senokot-S) tablet 1 tablet  1 tablet Oral QHS PRN Damita Lack, MD   1 tablet at 10/13/21 1753   tiZANidine (ZANAFLEX) tablet 2 mg  2 mg Oral TID PRN Cox, Amy N, DO   2 mg at 10/16/21 1443   traZODone (DESYREL) tablet 50 mg  50 mg Oral QHS PRN Amin, Ankit Chirag, MD       trihexyphenidyl (ARTANE) tablet 2 mg  2 mg Oral BID Cox, Amy N, DO   2 mg at 10/16/21  1540     Discharge Medications: Please see discharge summary for a list of discharge medications.  Relevant Imaging Results:  Relevant Lab Results:   Additional Information SSN:[id number]  Alberteen Sam, LCSW

## 2021-10-16 NOTE — Discharge Instructions (Signed)
Resume your HD on your outpt routine as before

## 2021-10-16 NOTE — Plan of Care (Signed)

## 2021-10-16 NOTE — Discharge Summary (Signed)
Physician Discharge Summary   Patient: Todd Brown MRN: [id number] DOB: 1960/08/05  Admit date:     10/07/2021  Discharge date: 10/16/21  Discharge Physician: Fritzi Mandes   PCP: Jodi Marble, MD   Recommendations at discharge:    Resume outpt HD on your schedule as before F/u PCP Dr Elijio Miles in 1-2 weeks  Discharge Diagnoses: Hypertensive urgency  Hospital Course:  61 year old Male with medical history of HTN, ESRD on HD via left AV fistula, schizophrenia, GERD, muscle spasm came to the ED with complaints of altered mental status and high blood pressure.  Initially patient's systolic blood pressure was greater than 200.  CT of the head was negative.  He was diagnosed with hypertensive urgency,  started on home medications and briefly on Cardene drip.  Nephrology was consulted for hemodialysis.     Hypertensive urgency > Improved Blood pressure has now improved. Continue monitor BP and titrate medications accordingly. Meds have been adjusted by nephrology   Altered mental status, appears to be at baseline. --Patient resides at [facility] years.  Ammonia level wnl,  normal B12, elevated. --pt appears at baseline. Follows commands, eating well   GERD (gastroesophageal reflux disease) -Continue PPI   End stage renal disease ([place]) hyperkalemia --Nephrology consulted for hemodialysis.  Continue hemodialysis as per schedule. --place on renal diet --K down to 4.4   Essential hypertension, benign --Continue blood pressure medications as above.   Schizophrenia ([place]) --History of garbled and pressured speech with possible cognitive impairment per psychiatry note on 10/04/2019. --Continue fluphenazine , trihexyphenidyl, carbamazepine    Overall at baseline. Ok from nephrology standpoint for d/c        Consultants: nephrology Procedures performed: none  Disposition:  group home Diet recommendation:  Discharge Diet Orders (From admission, onward)     Start     Ordered    10/16/21 0000  Diet - low sodium heart healthy        10/16/21 0921           Renal diet DISCHARGE MEDICATION: Allergies as of 10/16/2021       Reactions   Chlorpromazine Other (See Comments)   Reaction:  Unknown , pt states it makes him feel real bad Reaction:  Unknown , pt states it makes him feel real bad        Medication List     STOP taking these medications    amLODipine 10 MG tablet Commonly known as: NORVASC       TAKE these medications    acetaminophen 500 MG tablet Commonly known as: TYLENOL Take 500-1,000 mg by mouth every 6 (six) hours as needed for mild pain or moderate pain.   albuterol 108 (90 Base) MCG/ACT inhaler Commonly known as: VENTOLIN HFA Inhale 1-2 puffs into the lungs every 4 (four) hours as needed for wheezing or shortness of breath. Shake well   b complex-vitamin c-folic acid 0.8 MG Tabs tablet Take 1 tablet by mouth at bedtime.   calcium acetate 667 MG capsule Commonly known as: PHOSLO Take 2 capsules (1,334 mg total) by mouth 3 (three) times daily with meals.   carbamazepine 200 MG tablet Commonly known as: TEGRETOL Take 1,000 mg by mouth in the morning.   cholecalciferol 1000 units tablet Commonly known as: VITAMIN D Take 1,000 Units by mouth daily.   cloNIDine 0.3 MG tablet Commonly known as: CATAPRES Take 0.3 mg by mouth 3 (three) times daily.   ferrous sulfate 325 (65 FE) MG tablet Take 1 tablet (325  mg total) by mouth daily.   fluPHENAZine 2.5 MG tablet Commonly known as: PROLIXIN Take 2.5 mg by mouth daily.   Fluticasone-Salmeterol 250-50 MCG/DOSE Aepb Commonly known as: ADVAIR Inhale 1 puff into the lungs 2 (two) times daily. Rinse mouth after use discard 30 days after opening   hydrALAZINE 100 MG tablet Commonly known as: APRESOLINE Take 1 tablet (100 mg total) by mouth 3 (three) times daily.   isosorbide mononitrate 60 MG 24 hr tablet Commonly known as: IMDUR Take 1 tablet (60 mg total) by mouth 2  (two) times daily.   losartan 100 MG tablet Commonly known as: COZAAR Take 1 tablet (100 mg total) by mouth daily. What changed:  medication strength how much to take   meloxicam 7.5 MG tablet Commonly known as: MOBIC Take 7.5 mg by mouth daily.   NIFEdipine 90 MG 24 hr tablet Commonly known as: PROCARDIA XL/NIFEDICAL-XL Take 1 tablet (90 mg total) by mouth daily.   omeprazole 40 MG capsule Commonly known as: PRILOSEC Take 1 capsule (40 mg total) by mouth daily.   polyethylene glycol 17 g packet Commonly known as: MIRALAX / GLYCOLAX Take 17 g by mouth daily as needed for mild constipation.   tiZANidine 2 MG tablet Commonly known as: ZANAFLEX Take 2 mg by mouth 3 (three) times daily as needed for muscle spasms.   trihexyphenidyl 2 MG tablet Commonly known as: ARTANE Take 2 mg by mouth 2 (two) times daily.        Follow-up Information     Jodi Marble, MD. Schedule an appointment as soon as possible for a visit in 1 week(s).   Specialty: Internal Medicine Why: hospital f/u Contact information: [address] [place] [address] [phone]                Discharge Exam: Danley Danker Weights   10/13/21 1233 10/15/21 0927 10/15/21 1242  Weight: 70.8 kg 75.5 kg 73.8 kg     Condition at discharge: fair  The results of significant diagnostics from this hospitalization (including imaging, microbiology, ancillary and laboratory) are listed below for reference.   Imaging Studies: CT Head Wo Contrast  Result Date: 10/07/2021 CLINICAL DATA:  Altered mental status EXAM: CT HEAD WITHOUT CONTRAST TECHNIQUE: Contiguous axial images were obtained from the base of the skull through the vertex without intravenous contrast. RADIATION DOSE REDUCTION: This exam was performed according to the departmental dose-optimization program which includes automated exposure control, adjustment of the mA and/or kV according to patient size and/or use of iterative reconstruction  technique. COMPARISON:  10/09/2019 FINDINGS: Brain: No acute intracranial findings are seen in the noncontrast CT brain. Cortical sulci are prominent. There is decreased density in the periventricular white matter. Vascular: Unremarkable. Skull: Unremarkable. Sinuses/Orbits: Unremarkable. Other: No significant interval changes are noted. IMPRESSION: No acute intracranial findings are seen in noncontrast CT brain. Atrophy. Small-vessel disease. Electronically Signed   By: Elmer Picker M.D.   On: 10/07/2021 19:59   DG Chest Portable 1 View  Result Date: 10/07/2021 CLINICAL DATA:  Altered mental status. EXAM: PORTABLE CHEST 1 VIEW COMPARISON:  02/18/2021 FINDINGS: Right-sided dialysis catheter with tip in the mid SVC. Chronic and unchanged cardiomegaly. Vascular congestion. Mild septal thickening/pulmonary edema. No focal airspace disease. No significant pleural effusion. No pneumothorax. IMPRESSION: Chronic cardiomegaly with vascular congestion and minimal pulmonary edema. Electronically Signed   By: Keith Rake M.D.   On: 10/07/2021 22:42    Microbiology: Results for orders placed or performed during the hospital encounter of  10/07/21  Resp Panel by RT-PCR (Flu A&B, Covid) Nasopharyngeal Swab     Status: None   Collection Time: 10/07/21  7:48 PM   Specimen: Nasopharyngeal Swab; Nasopharyngeal(NP) swabs in vial transport medium  Result Value Ref Range Status   SARS Coronavirus 2 by RT PCR NEGATIVE NEGATIVE Final    Comment: (NOTE) SARS-CoV-2 target nucleic acids are NOT DETECTED.  The SARS-CoV-2 RNA is generally detectable in upper respiratory specimens during the acute phase of infection. The lowest concentration of SARS-CoV-2 viral copies this assay can detect is 138 copies/mL. A negative result does not preclude SARS-Cov-2 infection and should not be used as the sole basis for treatment or other patient management decisions. A negative result may occur with  improper specimen  collection/handling, submission of specimen other than nasopharyngeal swab, presence of viral mutation(s) within the areas targeted by this assay, and inadequate number of viral copies(<138 copies/mL). A negative result must be combined with clinical observations, patient history, and epidemiological information. The expected result is Negative.  Fact Sheet for Patients:  EntrepreneurPulse.com.au  Fact Sheet for Healthcare Providers:  IncredibleEmployment.be  This test is no t yet approved or cleared by the [place] FDA and  has been authorized for detection and/or diagnosis of SARS-CoV-2 by FDA under an Emergency Use Authorization (EUA). This EUA will remain  in effect (meaning this test can be used) for the duration of the COVID-19 declaration under Section 564(b)(1) of the Act, 21 U.S.C.section 360bbb-3(b)(1), unless the authorization is terminated  or revoked sooner.       Influenza A by PCR NEGATIVE NEGATIVE Final   Influenza B by PCR NEGATIVE NEGATIVE Final    Comment: (NOTE) The Xpert Xpress SARS-CoV-2/FLU/RSV plus assay is intended as an aid in the diagnosis of influenza from Nasopharyngeal swab specimens and should not be used as a sole basis for treatment. Nasal washings and aspirates are unacceptable for Xpert Xpress SARS-CoV-2/FLU/RSV testing.  Fact Sheet for Patients: EntrepreneurPulse.com.au  Fact Sheet for Healthcare Providers: IncredibleEmployment.be  This test is not yet approved or cleared by the [place] FDA and has been authorized for detection and/or diagnosis of SARS-CoV-2 by FDA under an Emergency Use Authorization (EUA). This EUA will remain in effect (meaning this test can be used) for the duration of the COVID-19 declaration under Section 564(b)(1) of the Act, 21 U.S.C. section 360bbb-3(b)(1), unless the authorization is terminated or revoked.  Performed at [facility], [address] [place], [place] [address]     Labs: CBC: Recent Labs  Lab 10/13/21 0444 10/14/21 0517 10/15/21 0705  WBC 4.6 3.9* 3.9*  HGB 9.7* 9.6* 10.2*  HCT 28.0* 28.5* 29.7*  MCV 98.6 99.0 99.3  PLT 179 168 916   Basic Metabolic Panel: Recent Labs  Lab 10/11/21 0520 10/12/21 0453 10/13/21 0444 10/14/21 0517 10/15/21 0705 10/15/21 1705 10/16/21 0646  NA 135 131* 131* 131* 125*  --  129*  K 4.1 4.7 5.7* 5.1 6.0* 4.4 5.3*  CL 99 90* 91* 93* 88*  --  91*  CO2 30 29 28 28 26   --  27  GLUCOSE 113* 87 80 77 82  --  71  BUN 23* 35* 48* 36* 52*  --  36*  CREATININE 5.11* 6.45* 7.51* 5.39* 6.41*  --  4.74*  CALCIUM 9.4 9.3 9.3 9.2 9.4  --  9.3  MG 2.1 2.1 2.3 2.0 2.2  --   --   PHOS  --  3.2 3.2 3.2 3.4  --   --  Liver Function Tests: No results for input(s): AST, ALT, ALKPHOS, BILITOT, PROT, ALBUMIN in the last 168 hours. CBG: Recent Labs  Lab 10/09/21 1216 10/09/21 1646 10/09/21 2014 10/09/21 2328 10/09/21 2332  GLUCAP 112* 88 97 94 92    Discharge time spent: greater than 30 minutes.  Signed: Fritzi Mandes, MD [facility] 10/16/2021

## 2021-12-15 ENCOUNTER — Inpatient Hospital Stay
Admission: EM | Admit: 2021-12-15 | Discharge: 2022-02-22 | DRG: 304 | Disposition: A | Discharge status: Skilled Nursing Facility | Payer: Medicaid Other | Source: Skilled Nursing Facility | Attending: Internal Medicine | Admitting: Internal Medicine

## 2021-12-15 ENCOUNTER — Emergency Department: Payer: Medicaid Other

## 2021-12-15 ENCOUNTER — Other Ambulatory Visit: Payer: Self-pay

## 2021-12-15 DIAGNOSIS — E46 Unspecified protein-calorie malnutrition: Secondary | ICD-10-CM | POA: Diagnosis present

## 2021-12-15 DIAGNOSIS — I161 Hypertensive emergency: Secondary | ICD-10-CM | POA: Diagnosis present

## 2021-12-15 DIAGNOSIS — G9341 Metabolic encephalopathy: Secondary | ICD-10-CM

## 2021-12-15 DIAGNOSIS — T797XXA Traumatic subcutaneous emphysema, initial encounter: Secondary | ICD-10-CM | POA: Clinically undetermined

## 2021-12-15 DIAGNOSIS — F99 Mental disorder, not otherwise specified: Secondary | ICD-10-CM

## 2021-12-15 DIAGNOSIS — I214 Non-ST elevation (NSTEMI) myocardial infarction: Secondary | ICD-10-CM

## 2021-12-15 DIAGNOSIS — I10 Essential (primary) hypertension: Secondary | ICD-10-CM | POA: Diagnosis not present

## 2021-12-15 DIAGNOSIS — E440 Moderate protein-calorie malnutrition: Secondary | ICD-10-CM | POA: Insufficient documentation

## 2021-12-15 DIAGNOSIS — N186 End stage renal disease: Secondary | ICD-10-CM

## 2021-12-15 DIAGNOSIS — I160 Hypertensive urgency: Secondary | ICD-10-CM

## 2021-12-15 DIAGNOSIS — Z992 Dependence on renal dialysis: Secondary | ICD-10-CM

## 2021-12-15 DIAGNOSIS — R4182 Altered mental status, unspecified: Secondary | ICD-10-CM | POA: Diagnosis not present

## 2021-12-15 DIAGNOSIS — E875 Hyperkalemia: Secondary | ICD-10-CM | POA: Diagnosis not present

## 2021-12-15 DIAGNOSIS — R5381 Other malaise: Secondary | ICD-10-CM | POA: Diagnosis present

## 2021-12-15 DIAGNOSIS — E871 Hypo-osmolality and hyponatremia: Secondary | ICD-10-CM

## 2021-12-15 DIAGNOSIS — D638 Anemia in other chronic diseases classified elsewhere: Secondary | ICD-10-CM | POA: Diagnosis present

## 2021-12-15 DIAGNOSIS — B370 Candidal stomatitis: Secondary | ICD-10-CM | POA: Clinically undetermined

## 2021-12-15 DIAGNOSIS — B1910 Unspecified viral hepatitis B without hepatic coma: Secondary | ICD-10-CM | POA: Diagnosis present

## 2021-12-15 DIAGNOSIS — H548 Legal blindness, as defined in USA: Secondary | ICD-10-CM | POA: Diagnosis present

## 2021-12-15 DIAGNOSIS — Z91158 Patient's noncompliance with renal dialysis for other reason: Secondary | ICD-10-CM | POA: Diagnosis not present

## 2021-12-15 DIAGNOSIS — D631 Anemia in chronic kidney disease: Secondary | ICD-10-CM | POA: Diagnosis present

## 2021-12-15 DIAGNOSIS — I9589 Other hypotension: Secondary | ICD-10-CM | POA: Diagnosis not present

## 2021-12-15 DIAGNOSIS — E8770 Fluid overload, unspecified: Secondary | ICD-10-CM | POA: Diagnosis not present

## 2021-12-15 DIAGNOSIS — F209 Schizophrenia, unspecified: Secondary | ICD-10-CM | POA: Diagnosis present

## 2021-12-15 DIAGNOSIS — J439 Emphysema, unspecified: Secondary | ICD-10-CM | POA: Diagnosis present

## 2021-12-15 DIAGNOSIS — Z79899 Other long term (current) drug therapy: Secondary | ICD-10-CM

## 2021-12-15 DIAGNOSIS — E162 Hypoglycemia, unspecified: Secondary | ICD-10-CM | POA: Diagnosis present

## 2021-12-15 DIAGNOSIS — Z6821 Body mass index (BMI) 21.0-21.9, adult: Secondary | ICD-10-CM

## 2021-12-15 DIAGNOSIS — N2581 Secondary hyperparathyroidism of renal origin: Secondary | ICD-10-CM | POA: Diagnosis present

## 2021-12-15 DIAGNOSIS — J9601 Acute respiratory failure with hypoxia: Secondary | ICD-10-CM | POA: Diagnosis not present

## 2021-12-15 DIAGNOSIS — R64 Cachexia: Secondary | ICD-10-CM | POA: Diagnosis present

## 2021-12-15 DIAGNOSIS — I259 Chronic ischemic heart disease, unspecified: Secondary | ICD-10-CM | POA: Diagnosis present

## 2021-12-15 DIAGNOSIS — I120 Hypertensive chronic kidney disease with stage 5 chronic kidney disease or end stage renal disease: Secondary | ICD-10-CM | POA: Diagnosis present

## 2021-12-15 DIAGNOSIS — Z20822 Contact with and (suspected) exposure to covid-19: Secondary | ICD-10-CM | POA: Diagnosis present

## 2021-12-15 DIAGNOSIS — R600 Localized edema: Secondary | ICD-10-CM | POA: Diagnosis present

## 2021-12-15 DIAGNOSIS — N189 Chronic kidney disease, unspecified: Secondary | ICD-10-CM | POA: Diagnosis present

## 2021-12-15 HISTORY — DX: End stage renal disease: Z99.2

## 2021-12-15 HISTORY — DX: Essential (primary) hypertension: I10

## 2021-12-15 HISTORY — DX: End stage renal disease: N18.6

## 2021-12-15 HISTORY — DX: Dependence on renal dialysis: Z99.2

## 2021-12-15 LAB — COMPREHENSIVE METABOLIC PANEL
ALT: 20 U/L (ref 0–44)
AST: 28 U/L (ref 15–41)
Albumin: 3.6 g/dL (ref 3.5–5.0)
Alkaline Phosphatase: 61 U/L (ref 38–126)
Anion gap: 15 (ref 5–15)
BUN: 51 mg/dL — ABNORMAL HIGH (ref 8–23)
CO2: 25 mmol/L (ref 22–32)
Calcium: 10.2 mg/dL (ref 8.9–10.3)
Chloride: 97 mmol/L — ABNORMAL LOW (ref 98–111)
Creatinine, Ser: 6.91 mg/dL — ABNORMAL HIGH (ref 0.61–1.24)
GFR, Estimated: 8 mL/min — ABNORMAL LOW (ref >60)
Glucose, Bld: 86 mg/dL (ref 70–99)
Potassium: 4.9 mmol/L (ref 3.5–5.1)
Sodium: 137 mmol/L (ref 135–145)
Total Bilirubin: 0.7 mg/dL (ref 0.3–1.2)
Total Protein: 6.8 g/dL (ref 6.5–8.1)

## 2021-12-15 LAB — CBC
HCT: 36.1 % — ABNORMAL LOW (ref 39.0–52.0)
Hemoglobin: 11.9 g/dL — ABNORMAL LOW (ref 13.0–17.0)
MCH: 33.6 pg (ref 26.0–34.0)
MCHC: 33 g/dL (ref 30.0–36.0)
MCV: 102 fL — ABNORMAL HIGH (ref 80.0–100.0)
Platelets: 148 10*3/uL — ABNORMAL LOW (ref 150–400)
RBC: 3.54 MIL/uL — ABNORMAL LOW (ref 4.22–5.81)
RDW: 14.5 % (ref 11.5–15.5)
WBC: 3.7 10*3/uL — ABNORMAL LOW (ref 4.0–10.5)
nRBC: 0 % (ref 0.0–0.2)

## 2021-12-15 LAB — GLUCOSE, CAPILLARY: Glucose-Capillary: 91 mg/dL (ref 70–99)

## 2021-12-15 LAB — TROPONIN I (HIGH SENSITIVITY)
Troponin I (High Sensitivity): 20 ng/L — ABNORMAL HIGH (ref <18)
Troponin I (High Sensitivity): 22 ng/L — ABNORMAL HIGH (ref <18)
Troponin I (High Sensitivity): 25 ng/L — ABNORMAL HIGH (ref <18)

## 2021-12-15 LAB — SARS CORONAVIRUS 2 BY RT PCR: SARS Coronavirus 2 by RT PCR: NEGATIVE

## 2021-12-15 LAB — MRSA NEXT GEN BY PCR, NASAL: MRSA by PCR Next Gen: NOT DETECTED

## 2021-12-15 MED ORDER — CALCIUM ACETATE (PHOS BINDER) 667 MG PO CAPS
1334.0000 mg | ORAL_CAPSULE | Freq: Three times a day (TID) | ORAL | Status: DC
  Administered 2021-12-16 – 2021-12-27 (×20): 1334 mg via ORAL
  Filled 2021-12-15 (×34): qty 2

## 2021-12-15 MED ORDER — PANTOPRAZOLE SODIUM 40 MG PO TBEC
40.0000 mg | DELAYED_RELEASE_TABLET | Freq: Every day | ORAL | Status: DC
  Administered 2021-12-16 – 2022-02-22 (×64): 40 mg via ORAL
  Filled 2021-12-15 (×64): qty 1

## 2021-12-15 MED ORDER — ACETAMINOPHEN 325 MG PO TABS: 650.0000 mg | ORAL_TABLET | Freq: Four times a day (QID) | ORAL | Status: AC | PRN

## 2021-12-15 MED ORDER — HYDRALAZINE HCL 20 MG/ML IJ SOLN
5.0000 mg | Freq: Four times a day (QID) | INTRAMUSCULAR | Status: DC | PRN
  Administered 2021-12-15 – 2021-12-16 (×2): 5 mg via INTRAVENOUS
  Filled 2021-12-15 (×2): qty 1

## 2021-12-15 MED ORDER — SENNOSIDES-DOCUSATE SODIUM 8.6-50 MG PO TABS
1.0000 | ORAL_TABLET | Freq: Every evening | ORAL | Status: DC | PRN
  Administered 2021-12-24 – 2022-01-15 (×3): 1 via ORAL
  Filled 2021-12-15 (×3): qty 1

## 2021-12-15 MED ORDER — HEPARIN SODIUM (PORCINE) 5000 UNIT/ML IJ SOLN
5000.0000 [IU] | Freq: Three times a day (TID) | INTRAMUSCULAR | Status: DC
  Administered 2021-12-15 – 2022-02-22 (×198): 5000 [IU] via SUBCUTANEOUS
  Filled 2021-12-15 (×198): qty 1

## 2021-12-15 MED ORDER — ONDANSETRON HCL 4 MG PO TABS: 4.0000 mg | ORAL_TABLET | Freq: Four times a day (QID) | ORAL | Status: DC | PRN

## 2021-12-15 MED ORDER — HYDRALAZINE HCL 50 MG PO TABS
100.0000 mg | ORAL_TABLET | Freq: Three times a day (TID) | ORAL | Status: DC
  Administered 2021-12-15 – 2021-12-18 (×4): 100 mg via ORAL
  Filled 2021-12-15 (×4): qty 2

## 2021-12-15 MED ORDER — CARBAMAZEPINE 200 MG PO TABS
1000.0000 mg | ORAL_TABLET | Freq: Every morning | ORAL | Status: DC
  Administered 2021-12-16 – 2022-02-22 (×64): 1000 mg via ORAL
  Filled 2021-12-15 (×66): qty 5

## 2021-12-15 MED ORDER — ORAL CARE MOUTH RINSE: 15.0000 mL | OROMUCOSAL | Status: DC | PRN

## 2021-12-15 MED ORDER — ISOSORBIDE MONONITRATE ER 30 MG PO TB24
60.0000 mg | ORAL_TABLET | Freq: Two times a day (BID) | ORAL | Status: DC
  Administered 2021-12-15 – 2022-02-22 (×128): 60 mg via ORAL
  Filled 2021-12-15 (×26): qty 2
  Filled 2021-12-15: qty 1
  Filled 2021-12-15 (×8): qty 2
  Filled 2021-12-15: qty 1
  Filled 2021-12-15 (×8): qty 2
  Filled 2021-12-15 (×2): qty 1
  Filled 2021-12-15 (×10): qty 2
  Filled 2021-12-15: qty 1
  Filled 2021-12-15 (×11): qty 2
  Filled 2021-12-15: qty 1
  Filled 2021-12-15 (×10): qty 2
  Filled 2021-12-15: qty 1
  Filled 2021-12-15 (×2): qty 2
  Filled 2021-12-15: qty 1
  Filled 2021-12-15 (×5): qty 2
  Filled 2021-12-15: qty 1
  Filled 2021-12-15 (×8): qty 2
  Filled 2021-12-15: qty 1
  Filled 2021-12-15 (×35): qty 2

## 2021-12-15 MED ORDER — CLONIDINE HCL 0.1 MG PO TABS
0.3000 mg | ORAL_TABLET | Freq: Three times a day (TID) | ORAL | Status: DC
  Administered 2021-12-15 – 2021-12-24 (×21): 0.3 mg via ORAL
  Filled 2021-12-15 (×21): qty 3

## 2021-12-15 MED ORDER — ACETAMINOPHEN 650 MG RE SUPP: 650.0000 mg | Freq: Four times a day (QID) | RECTAL | Status: AC | PRN

## 2021-12-15 MED ORDER — NICARDIPINE HCL IN NACL 20-0.86 MG/200ML-% IV SOLN
3.0000 mg/h | INTRAVENOUS | Status: DC
  Administered 2021-12-15: 5 mg/h via INTRAVENOUS
  Administered 2021-12-16: 7.5 mg/h via INTRAVENOUS
  Administered 2021-12-16: 3 mg/h via INTRAVENOUS
  Administered 2021-12-16: 7.5 mg/h via INTRAVENOUS
  Administered 2021-12-17: 5 mg/h via INTRAVENOUS
  Administered 2021-12-17 (×2): 2.5 mg/h via INTRAVENOUS
  Administered 2021-12-17: 5 mg/h via INTRAVENOUS
  Administered 2021-12-18: 2.5 mg/h via INTRAVENOUS
  Filled 2021-12-15 (×11): qty 200

## 2021-12-15 MED ORDER — AMLODIPINE BESYLATE 10 MG PO TABS
10.0000 mg | ORAL_TABLET | Freq: Every morning | ORAL | Status: DC
  Administered 2021-12-16 – 2022-02-22 (×61): 10 mg via ORAL
  Filled 2021-12-15 (×4): qty 1
  Filled 2021-12-15: qty 2
  Filled 2021-12-15 (×29): qty 1
  Filled 2021-12-15: qty 2
  Filled 2021-12-15 (×12): qty 1
  Filled 2021-12-15: qty 2
  Filled 2021-12-15 (×10): qty 1
  Filled 2021-12-15: qty 2
  Filled 2021-12-15 (×2): qty 1

## 2021-12-15 MED ORDER — ONDANSETRON HCL 4 MG/2ML IJ SOLN: 4.0000 mg | Freq: Four times a day (QID) | INTRAMUSCULAR | Status: DC | PRN

## 2021-12-15 MED ORDER — ALBUTEROL SULFATE (2.5 MG/3ML) 0.083% IN NEBU: 3.0000 mL | INHALATION_SOLUTION | RESPIRATORY_TRACT | Status: DC | PRN

## 2021-12-15 MED ORDER — CHLORHEXIDINE GLUCONATE CLOTH 2 % EX PADS
6.0000 | MEDICATED_PAD | Freq: Every day | CUTANEOUS | Status: DC
  Administered 2021-12-15 – 2021-12-23 (×8): 6 via TOPICAL

## 2021-12-15 MED ORDER — LOSARTAN POTASSIUM 50 MG PO TABS
100.0000 mg | ORAL_TABLET | Freq: Every day | ORAL | Status: DC
  Administered 2021-12-16 – 2021-12-21 (×5): 100 mg via ORAL
  Filled 2021-12-15 (×5): qty 2

## 2021-12-15 MED ORDER — FLUPHENAZINE HCL 5 MG PO TABS
2.5000 mg | ORAL_TABLET | Freq: Every morning | ORAL | Status: DC
  Administered 2021-12-16 – 2022-02-22 (×65): 2.5 mg via ORAL
  Filled 2021-12-15 (×67): qty 1

## 2021-12-15 MED ORDER — MOMETASONE FURO-FORMOTEROL FUM 200-5 MCG/ACT IN AERO
2.0000 | INHALATION_SPRAY | Freq: Two times a day (BID) | RESPIRATORY_TRACT | Status: DC
  Administered 2021-12-16 – 2022-02-22 (×120): 2 via RESPIRATORY_TRACT
  Filled 2021-12-15 (×2): qty 8.8

## 2021-12-15 MED ORDER — FERROUS SULFATE 325 (65 FE) MG PO TABS
325.0000 mg | ORAL_TABLET | Freq: Every day | ORAL | Status: DC
  Administered 2021-12-16 – 2022-02-22 (×60): 325 mg via ORAL
  Filled 2021-12-15 (×61): qty 1

## 2021-12-15 MED ORDER — TRIHEXYPHENIDYL HCL 2 MG PO TABS
2.0000 mg | ORAL_TABLET | Freq: Two times a day (BID) | ORAL | Status: DC
  Administered 2021-12-15 – 2022-02-22 (×124): 2 mg via ORAL
  Filled 2021-12-15 (×140): qty 1

## 2021-12-15 MED ORDER — POLYETHYLENE GLYCOL 3350 17 G PO PACK
17.0000 g | PACK | Freq: Two times a day (BID) | ORAL | Status: DC | PRN
  Administered 2021-12-25 – 2021-12-26 (×2): 17 g via ORAL
  Filled 2021-12-15 (×2): qty 1

## 2021-12-15 MED ORDER — LABETALOL HCL 5 MG/ML IV SOLN
10.0000 mg | Freq: Once | INTRAVENOUS | Status: AC
  Administered 2021-12-15: 10 mg via INTRAVENOUS
  Filled 2021-12-15: qty 4

## 2021-12-15 NOTE — Assessment & Plan Note (Addendum)
Nephrology consulted, full dialysis session following admission and again today to keep him on his Monday Wednesday Friday schedule.  Next session scheduled for 7/24.

## 2021-12-15 NOTE — ED Provider Notes (Signed)
[facility] Provider Note    Event Date/Time   First MD Initiated Contact with Patient 12/15/21 1337     (approximate)   History   Altered Mental Status   HPI  Todd Brown is a 61 y.o. male patient comes by EMS.  EMS reports that staff at his care facility is new and cannot give them a good history but apparently he missed dialysis today and came in because he is not himself and seems to be altered.  No other history is available.  When I see him he is very altered he is minimally responsive.  He will not squeeze my fingers.  Blood pressure is markedly elevated.      Physical Exam   Triage Vital Signs: ED Triage Vitals  Enc Vitals Group     BP 12/15/21 1204 (!) 215/85     Pulse Rate 12/15/21 1203 64     Resp 12/15/21 1203 18     Temp 12/15/21 1204 (!) 96.4 F (35.8 C)     Temp Source 12/15/21 1204 Axillary     SpO2 12/15/21 1203 98 %     Weight --      Height --      Head Circumference --      Peak Flow --      Pain Score 12/15/21 1132 0     Pain Loc --      Pain Edu? --      Excl. in [place]? --     Most recent vital signs: Vitals:   12/15/21 1404 12/15/21 1530  BP: 140/63 (!) 171/78  Pulse: (!) 50 (!) 55  Resp: 11 13  Temp:    SpO2: 91% 98%     General: Difficult to arouse not following commands Head normocephalic atraumatic Eyes pupils equal round reactive CV:  Good peripheral perfusion.  Heart regular rate and rhythm no audible murmurs Resp:  Normal effort.  Lungs are clear Abd:  No distention.  Not apparently tender Extremities: Trace edema  Labs (all labs ordered are listed, but only abnormal results are displayed) Labs Reviewed  COMPREHENSIVE METABOLIC PANEL - Abnormal; Notable for the following components:      Result Value   Chloride 97 (*)    BUN 51 (*)    Creatinine, Ser 6.91 (*)    GFR, Estimated 8 (*)    All other components within normal limits  CBC - Abnormal; Notable for the following components:   WBC 3.7  (*)    RBC 3.54 (*)    Hemoglobin 11.9 (*)    HCT 36.1 (*)    MCV 102.0 (*)    Platelets 148 (*)    All other components within normal limits  TROPONIN I (HIGH SENSITIVITY) - Abnormal; Notable for the following components:   Troponin I (High Sensitivity) 20 (*)    All other components within normal limits  URINALYSIS, ROUTINE W REFLEX MICROSCOPIC  CBG MONITORING, ED  TROPONIN I (HIGH SENSITIVITY)     EKG EKG read and interpreted by me shows normal sinus rhythm rate of 67 normal axis baseline is irregular but there may be ST elevation in 3 and F it is difficult to tell.    RADIOLOGY CT read by radiology reviewed and interpreted by me shows no acute disease.  Chest x-ray also read by radiology reviewed and interpreted by me shows no acute disease.  PROCEDURES:  Critical Care performed: Critical care time 20 minutes.  This includes repeated visits to  the patient's bedside to check and see how he is doing.  At 4:10 PM he is now awake and talking but not making any sense still not following commands.  I discussed this in detail with Dr. Zollie Scale he feels this is not close to his baseline yet.  Dr. Zollie Scale mentioned that the patient has baseline psychiatric symptoms and was not himself last week at dialysis either.  He is wondering if possibly his psychiatric medications could have been changed and are doing this to him.  He asked me to consult psychiatry which I have done.  We will get dialysis on him tomorrow. Patient's blood pressure is currently 239 systolic. Procedures   MEDICATIONS ORDERED IN ED: Medications  labetalol (NORMODYNE) injection 10 mg (10 mg Intravenous Given 12/15/21 1359)     IMPRESSION / MDM / ASSESSMENT AND PLAN / ED COURSE  I reviewed the triage vital signs and the nursing notes.   There is no sign of stroke or any other kind of intracranial problem.  Possibly he had hypertensive encephalopathy.  This seems to have improved although his blood pressure still high  this could be monitored inpatient for a day or so.  We will try to get him dialyzed tomorrow so his electrolytes do not go completely out of whack.  By psychiatry for advice on his mental status and his psychiatric medications.  Patient's presentation is most consistent with acute presentation with potential threat to life or bodily function.  The patient is on the cardiac monitor to evaluate for evidence of arrhythmia and/or significant heart rate changes.  None have been s   FINAL CLINICAL IMPRESSION(S) / ED DIAGNOSES   Final diagnoses:  Altered mental status, unspecified altered mental status type  Severe hypertension     Rx / DC Orders   ED Discharge Orders     None        Note:  This document was prepared using Dragon voice recognition software and may include unintentional dictation errors.   Nena Polio, MD 12/15/21 [phone]

## 2021-12-15 NOTE — Assessment & Plan Note (Addendum)
Patient was able to wean off of his nicardipine drip.  Suspect that what has happened is is that when he does not take his blood pressure medicines either by choice or if not given on dialysis days, he has such malignant hypertension that makes him acutely confused and agitated.  There may be an underlying undiagnosed mental health disorder.  The best plan for the patient will be to ensure that he gets his blood pressure medications even dialysis days.  He is to continue his Tegretol and Artane.   Appreciate pharmacy help.  With their recommendations, will change losartan to irbesartan 150mg  to 300mg   daily which gives a significantly longer half life and better BP control throughout the day.  We will also start alpha blockers:doxazosin at 2mg  daily and offers room to titriate up to 16mg  daily.  Holding off on beta-blocker given heart rate in the 60s.  We will also consider in the future adding minoxidil starting at 5mg  daily and titrate to max of 40mg  daily. -Nephrology added clonidine patch along with p.o. clonidine.

## 2021-12-15 NOTE — ED Notes (Signed)
Pt titrated to RA

## 2021-12-15 NOTE — ED Notes (Signed)
Pt placed on 2 L [place] for o2sat

## 2021-12-15 NOTE — ED Triage Notes (Signed)
Pt comes via EMS from [place] with c/o AMS not at baseline per staff. Pt missed dialysis today. Pt is unsteady and uses wheelchair. Pt is able to state name per EMS. Different staff at place so not good report from them per EMS.  BP_202/100 60 98% RA CBG-109

## 2021-12-15 NOTE — Assessment & Plan Note (Addendum)
Resumed on his home p.o. amlodipine 10 mg daily, clonidine 0.3 mg p.o. 3 times daily, hydralazine 100 mg p.o. 3 times daily, isosorbide mononitrate 60 mg p.o. twice daily, losartan 100 mg daily.  Pressures at time improved, but then blood pressure starts to spike again.  As above, changing ARB and adding alpha-blocker. -Nephrology also added clonidine patch along with p.o. clonidine

## 2021-12-15 NOTE — ED Notes (Signed)
Messaged provider about pt BP at this time

## 2021-12-15 NOTE — ED Notes (Signed)
Patient fidgety at this time, moving all ext but non responsive to voice or commands. Pt grunting but otherwise does not appear to be under and distress.

## 2021-12-15 NOTE — ED Notes (Signed)
This RN called lab to inquire about troponin that was never completed, that way ordered hours prior. Lab stating " we did not have enough serum to complete test." This RN asking if they could inform us next time they require another sample. Sample obtained and sent.

## 2021-12-15 NOTE — Assessment & Plan Note (Addendum)
Nursing left message for guardian.  Patient is unable to care for self.  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 Continue home Tegretol and Artane.  Appreciate psychiatry help.  Patient on scheduled and as needed Haldol which appears to have made a difference in his compliance.  Psychosis appears to be resolving.  I suspect some of this may be secondary to hypertensive encephalopathy although based on records, he likely has an underlying mental health disorder that has not been formally diagnosed.  Will discharge on as needed Haldol 5 mg p.o. twice daily as needed for behavioral agitation and refusal to take medicines.

## 2021-12-15 NOTE — Assessment & Plan Note (Signed)
-   Type II secondary to hypertensive emergency/urgency - Control blood pressure - No indication for heparin GTT at this time

## 2021-12-15 NOTE — Hospital Course (Addendum)
61 year old male with past medical history of hypertension and end-stage renal disease on hemodialysis presented to the emergency room from his group home/Assisted living facility on 7/17 with confusion.  (Patient's baseline was that he is unable to care for himself and has a legal guardian.)  Patient last received dialysis on Friday, 7/14.    Patient found to have markedly elevated blood pressure with a systolic in the 102V requiring Cardene drip and placement of patient to the ICU. Nephrology consulted and resumed patient's dialysis.  Over the next few weeks, blood pressure better controlled.  Patient was also started on as needed Haldol for agitation.  Hospital course complicated by episode of periorbital swelling bilaterally felt to be secondary to subcutaneous emphysema, resolved on steroids.  Also had episode of oral thrush treated with Magic mouthwash.    Has been medically stable for several weeks at this point. Patient's assisted living facility willing to take patient back after becomes much stronger at skilled nursing facility.  Skilled nursing facility search has been in progress, with no bed offers thus far.

## 2021-12-15 NOTE — ED Provider Triage Note (Signed)
Emergency Medicine Provider Triage Evaluation Note  Todd Brown , a 61 y.o. male  was evaluated in triage.  Sent from [name]" for reported altered mental status.  Patient has a history of schizophrenia, hypertension, COPD, chronic respiratory failure, end-stage renal disease per his med sheet.  Patient is arousable with stimuli.  Missed dialysis today.  Review of Systems  Positive: Altered mental status,  Negative: Patient nonverbal during triage.  Physical Exam  BP (!) 219/98   Pulse 64   Resp 18   SpO2 98%  Gen:   Awake, no distress   Resp:  Normal effort.  Clear. MSK:   Mild pitting edema lower extremities.   Other:    Medical Decision Making  Medically screening exam initiated at 12:07 PM.  Appropriate orders placed.  Todd Brown was informed that the remainder of the evaluation will be completed by another provider, this initial triage assessment does not replace that evaluation, and the importance of remaining in the ED until their evaluation is complete.    Todd Hai, PA-C 12/15/21 1217

## 2021-12-15 NOTE — Progress Notes (Addendum)
Established hemodailysis patient known at [facility] MWF 10am. Spoke with Hinton Dyer RN at clinic about this patient, Hinton Dyer stated that for the past month he has become more confused. Stated that he will respond when asked questions, however does not move in response. She stated also that she feels he may have lost weight recently, therefore they are hesitant to pull fluid. We also discussed a make-up treatment for tomorrow at 11am, if patient does not require admission. Please contact me with any dialysis placement concerns. [phone]

## 2021-12-15 NOTE — H&P (Signed)
History and Physical   Todd Brown UYQ:[id number] DOB: 02/21/1961 DOA: 12/15/2021  PCP: Jodi Marble, MD  Outpatient Specialists: Dr. Holley Raring, nephrology Patient coming from: [name]  I have personally briefly reviewed patient's old medical records in [place].  Chief Concern: Altered mental status  HPI: [name] is a 61 year old male with history of hypertension, **  Initial vitals in the emergency department showed temperature of 96.4, respiration rate of 18, heart rate of 54, improved to 70, initial blood pressure 226/104, improved to 182/78, SPO2 of 95% on room air.  Serum sodium is 137, potassium 4.9, chloride 97, bicarb 25, BUN of 51, serum creatinine of 6.91, GFR of 8, nonfasting blood glucose 86, WBC 3.7, hemoglobin 11.9, platelets of 148.  High sensitive troponin was elevated at 30.  ED treatment: Labetalol 10 mg IV one-time dose given in the ED  At bedside patient mumbles incoherently when I talk to him and ask him his full name, his age, his current location.  Per EDP, nephrology thought that patient's was not himself at his prior hemodialysis session.  Social history: [facility] is from [facility].  ROS: Unable to complete  ED Course: Discussed with emergency medicine provider, patient requiring hospitalization for chief concerns of altered mental status and hypertensive urgency/emergency.  Assessment/Plan  Principal Problem:   Altered mental status Active Problems:   Essential hypertension   Hypertensive urgency   ESRD on dialysis [facility])   NSTEMI (non-ST elevated myocardial infarction) ([place])   Assessment and Plan:  * Altered mental status - Baseline unknown - Presumed secondary to hypertensive urgency/emergency - We will check procalcitonin, COVID PCR, UA  NSTEMI (non-ST elevated myocardial infarction) (HCC) - Type II secondary to hypertensive emergency/urgency - Control blood pressure - No indication for  heparin GTT at this time  ESRD on dialysis [facility]) - MWF - Nephrology has been consulted and states that patient can get dialysis on 12/16/2021  Hypertensive urgency - Etiology work-up in progress, I suspect this is secondary to missing dialysis session - Status post labetalol 10 mg IV per EDP - Hydralazine 5 mg IV every 6 hours as needed for SBP greater than 190, 4 doses ordered - I have ordered patient nicardipine gtt. - Admit to inpatient, stepdown  Essential hypertension - I have resumed amlodipine 10 mg daily, clonidine 0.3 mg p.o. 3 times daily, hydralazine 100 mg p.o. 3 times daily, isosorbide mononitrate 60 mg p.o. twice daily, losartan 100 mg daily  Chart reviewed.   DVT prophylaxis: Heparin 5000 units subcutaneous every 8 hours Code Status: Full code Diet: Renal Family Communication: No Disposition Plan: Pending clinical course Consults called: None at this time Admission status: Stepdown, inpatient  Past Medical History:  Diagnosis Date   ESRD (end stage renal disease) on dialysis ([place])    Hypertension    History reviewed. No pertinent surgical history.  Social History:  reports that he does not currently use alcohol. He reports that he does not currently use drugs. No history on file for tobacco use.  No Known Allergies Family History  Family history unknown: Yes   Family history: Family history reviewed and not pertinent  Prior to Admission medications   Losartan 100 mg daily; isosorbide mononitrate 60 mg p.o. twice daily, hydralazine 100 mg p.o. 3 times daily; amlodipine 10 mg daily; clonidine 0.3 mg p.o. 3 times daily   Physical Exam: Vitals:   12/15/21 1915 12/15/21 1930 12/15/21 1945 12/15/21 2000  BP: (!) 180/82 Marland Kitchen)  163/82 (!) 154/75 (!) 151/74  Pulse:   76 74  Resp: 14 15 11 14   Temp:      TempSrc:      SpO2:   93% 93%   Constitutional: appears frail and cachectic, NAD, calm, comfortable Eyes: PERRL, lids and conjunctivae normal ENMT: Mucous  membranes are moist. Posterior pharynx clear of any exudate or lesions.  Poor dentition.  Unable to assess hearing Neck: normal, supple, no masses, no thyromegaly Respiratory: clear to auscultation bilaterally, no wheezing, no crackles. Normal respiratory effort. No accessory muscle use.  Cardiovascular: Regular rate and rhythm, no murmurs / rubs / gallops. No extremity edema. 2+ pedal pulses. No carotid bruits.  Left upper extremity fistula with appropriate bruit Abdomen: Scaphoid, no tenderness, no masses palpated, no hepatosplenomegaly. Bowel sounds positive.  Musculoskeletal: no clubbing / cyanosis. No joint deformity upper and lower extremities. Good ROM, no contractures, no atrophy. Normal muscle tone.  Skin: no rashes, lesions, ulcers. No induration Neurologic: Sensation intact. Strength 5/5 in all 4.  Psychiatric: Normal judgment and insight. Alert and oriented x 3. Normal mood.   EKG: independently reviewed, showing sinus rhythm with rate of 54, QTc 460  Chest x-ray on Admission: I personally reviewed and I agree with radiologist reading as below.  CT HEAD WO CONTRAST (5MM)  Result Date: 12/15/2021 CLINICAL DATA:  Altered mental status EXAM: CT HEAD WITHOUT CONTRAST TECHNIQUE: Contiguous axial images were obtained from the base of the skull through the vertex without intravenous contrast. RADIATION DOSE REDUCTION: This exam was performed according to the departmental dose-optimization program which includes automated exposure control, adjustment of the mA and/or kV according to patient size and/or use of iterative reconstruction technique. COMPARISON:  None Available. FINDINGS: Brain: No evidence of acute cortical infarction, hemorrhage, cerebral edema, mass, mass effect, or midline shift. No hydrocephalus or extra-axial fluid collection. 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 Periventricular white matter changes, likely the sequela of chronic small  vessel ischemic disease. Vascular: No hyperdense vessel. Skull: Normal. Negative for fracture or focal lesion. Sinuses/Orbits: No acute finding. Other: The mastoid air cells are well aerated. IMPRESSION: No acute intracranial process. Electronically Signed   By: Merilyn Baba M.D.   On: 12/15/2021 14:22   DG Chest Portable 1 View  Result Date: 12/15/2021 CLINICAL DATA:  Altered mental status. EXAM: PORTABLE CHEST 1 VIEW COMPARISON:  None Available. FINDINGS: Mild cardiomegaly is noted. Right internal jugular dialysis catheter is noted with distal tip in expected position of cavoatrial junction. Both lungs are clear. The visualized skeletal structures are unremarkable. IMPRESSION: No active disease. Electronically Signed   By: Marijo Conception M.D.   On: 12/15/2021 14:18    Labs on Admission: I have personally reviewed following labs  CBC: Recent Labs  Lab 12/15/21 1203  WBC 3.7*  HGB 11.9*  HCT 36.1*  MCV 102.0*  PLT 578*   Basic Metabolic Panel: Recent Labs  Lab 12/15/21 1203  NA 137  K 4.9  CL 97*  CO2 25  GLUCOSE 86  BUN 51*  CREATININE 6.91*  CALCIUM 10.2   GFR: CrCl cannot be calculated (Unknown ideal weight.).  Liver Function Tests: Recent Labs  Lab 12/15/21 1203  AST 28  ALT 20  ALKPHOS 61  BILITOT 0.7  PROT 6.8  ALBUMIN 3.6   CRITICAL CARE Performed by: Briant [name] Aisha Greenberger  Total critical care time: 35 minutes  Critical care time was exclusive of separately billable procedures and treating other  patients.  Critical care was necessary to treat or prevent imminent or life-threatening deterioration.  Critical care was time spent personally by me on the following activities: development of treatment plan with patient and/or surrogate as well as nursing, discussions with consultants, evaluation of patient's response to treatment, examination of patient, obtaining history from patient or surrogate, ordering and performing treatments and interventions, ordering and review  of laboratory studies, ordering and review of radiographic studies, pulse oximetry and re-evaluation of patient's condition.  Dr. Tobie Poet [facility]  If 7PM-7AM, please contact overnight-coverage provider If 7AM-7PM, please contact day coverage provider [email or web address]  12/15/2021, 8:08 PM

## 2021-12-16 DIAGNOSIS — I10 Essential (primary) hypertension: Secondary | ICD-10-CM | POA: Diagnosis not present

## 2021-12-16 DIAGNOSIS — Z992 Dependence on renal dialysis: Secondary | ICD-10-CM

## 2021-12-16 DIAGNOSIS — I161 Hypertensive emergency: Secondary | ICD-10-CM | POA: Diagnosis not present

## 2021-12-16 DIAGNOSIS — N186 End stage renal disease: Secondary | ICD-10-CM

## 2021-12-16 DIAGNOSIS — G9341 Metabolic encephalopathy: Secondary | ICD-10-CM | POA: Diagnosis not present

## 2021-12-16 LAB — CBC
HCT: 33.3 % — ABNORMAL LOW (ref 39.0–52.0)
Hemoglobin: 11.4 g/dL — ABNORMAL LOW (ref 13.0–17.0)
MCH: 33.1 pg (ref 26.0–34.0)
MCHC: 34.2 g/dL (ref 30.0–36.0)
MCV: 96.8 fL (ref 80.0–100.0)
Platelets: 161 10*3/uL (ref 150–400)
RBC: 3.44 MIL/uL — ABNORMAL LOW (ref 4.22–5.81)
RDW: 14.1 % (ref 11.5–15.5)
WBC: 3.1 10*3/uL — ABNORMAL LOW (ref 4.0–10.5)
nRBC: 0 % (ref 0.0–0.2)

## 2021-12-16 LAB — BASIC METABOLIC PANEL
Anion gap: 11 (ref 5–15)
BUN: 55 mg/dL — ABNORMAL HIGH (ref 8–23)
CO2: 27 mmol/L (ref 22–32)
Calcium: 9.8 mg/dL (ref 8.9–10.3)
Chloride: 98 mmol/L (ref 98–111)
Creatinine, Ser: 7.86 mg/dL — ABNORMAL HIGH (ref 0.61–1.24)
GFR, Estimated: 7 mL/min — ABNORMAL LOW (ref >60)
Glucose, Bld: 74 mg/dL (ref 70–99)
Potassium: 4.9 mmol/L (ref 3.5–5.1)
Sodium: 136 mmol/L (ref 135–145)

## 2021-12-16 LAB — HEPATITIS B SURFACE ANTIBODY,QUALITATIVE: Hep B S Ab: REACTIVE — AB

## 2021-12-16 LAB — HEPATITIS B SURFACE ANTIGEN: Hepatitis B Surface Ag: NONREACTIVE

## 2021-12-16 LAB — HEPATITIS B CORE ANTIBODY, TOTAL: Hep B Core Total Ab: NONREACTIVE

## 2021-12-16 LAB — HIV ANTIBODY (ROUTINE TESTING W REFLEX): HIV Screen 4th Generation wRfx: NONREACTIVE

## 2021-12-16 LAB — PROCALCITONIN: Procalcitonin: 0.23 ng/mL

## 2021-12-16 LAB — HEPATITIS C ANTIBODY: HCV Ab: NONREACTIVE

## 2021-12-16 MED ORDER — HYDRALAZINE HCL 20 MG/ML IJ SOLN
10.0000 mg | Freq: Four times a day (QID) | INTRAMUSCULAR | Status: DC | PRN
  Administered 2021-12-17 – 2021-12-20 (×4): 10 mg via INTRAVENOUS
  Filled 2021-12-16 (×5): qty 1

## 2021-12-16 MED ORDER — HYDRALAZINE HCL 20 MG/ML IJ SOLN
10.0000 mg | Freq: Once | INTRAMUSCULAR | Status: AC
  Administered 2021-12-16: 10 mg via INTRAVENOUS

## 2021-12-16 MED ORDER — HYDRALAZINE HCL 10 MG PO TABS
10.0000 mg | ORAL_TABLET | ORAL | Status: DC
  Filled 2021-12-16: qty 1

## 2021-12-16 MED ORDER — ALTEPLASE 2 MG IJ SOLR: 2.0000 mg | Freq: Once | INTRAMUSCULAR | Status: DC | PRN

## 2021-12-16 MED ORDER — ANTICOAGULANT SODIUM CITRATE 4% (200MG/5ML) IV SOLN
5.0000 mL | Status: DC | PRN
Start: 2021-12-16 — End: 2022-02-22

## 2021-12-16 MED ORDER — LIDOCAINE HCL (PF) 1 % IJ SOLN: 5.0000 mL | INTRAMUSCULAR | Status: DC | PRN

## 2021-12-16 MED ORDER — HEPARIN SODIUM (PORCINE) 1000 UNIT/ML DIALYSIS: 1000.0000 [IU] | INTRAMUSCULAR | Status: DC | PRN

## 2021-12-16 MED ORDER — LIDOCAINE-PRILOCAINE 2.5-2.5 % EX CREA: 1.0000 | TOPICAL_CREAM | CUTANEOUS | Status: DC | PRN

## 2021-12-16 MED ORDER — PENTAFLUOROPROP-TETRAFLUOROETH EX AERO: 1.0000 | INHALATION_SPRAY | CUTANEOUS | Status: DC | PRN

## 2021-12-16 NOTE — Progress Notes (Signed)
Hd tx of 3.5hrs completed. 84L total vol processed. No pt acute distress/complications noted. Report given to Todd stainback, rn. Total uf removed: 103ml Post hd weight: 71.4kg Post hd v/s: 97.3 168/79(98) 78 11 95%

## 2021-12-16 NOTE — Progress Notes (Signed)
[facility]  ROUNDING NOTE   Subjective:   Todd Brown is a 61 year old male with past medical history including hypertension, anemia, schizophrenia and end-stage renal disease on hemodialysis.  Patient has presented to the ED with altered mental status.  Patient has been admitted for Altered mental status [R41.82] Hypertensive urgency [I16.0] Severe hypertension [I10] Altered mental status, unspecified altered mental status type [R41.82]  Patient is known to our practice and receives outpatient dialysis treatments at [facility] on a MWF schedule.  Last dialysis treatment received on Friday.  Patient seen and evaluated at bedside in ICU.  Patient extremely drowsy and unable to participate in visit.  History obtained through chart review.  Patient is a current resident at [facility].  Outpatient dialysis clinic reports recent mentation changes during treatments.  Facility staff unable to provide much information due to new status with patient long enough.  On ED arrival include sodium 137, potassium 4, BUN 51, creatinine 6.691 with GFR 8, WBC 3.7 with hemoglobin 11.9.  CT head shows no acute changes.  Chest x-ray also negative.  We have been consulted to provide dialysis during this admission.   Objective:  Vital signs in last 24 hours:  Temp:  [95.6 F (35.3 C)-97.5 F (36.4 C)] 97.3 F (36.3 C) (07/18 1530) Pulse Rate:  [52-82] 70 (07/18 1400) Resp:  [9-20] 16 (07/18 1400) BP: (116-228)/(65-169) 152/140 (07/18 1400) SpO2:  [91 %-99 %] 93 % (07/18 1400) Weight:  [70.6 kg-72.4 kg] 72.4 kg (07/18 1530)  Weight change:  Filed Weights   12/15/21 2050 12/16/21 1530  Weight: 70.6 kg 72.4 kg    Intake/Output: I/O last 3 completed shifts: In: 330.9 [I.V.:330.9] Out: -    Intake/Output this shift:  Total I/O In: 291.1 [I.V.:291.1] Out: -   Physical Exam: General: Chronically ill-appearing  Head: Normocephalic, atraumatic. Moist oral mucosal membranes   Eyes: Anicteric  Lungs:  Clear to auscultation, normal effort, room air  Heart: Regular rate and rhythm  Abdomen:  Soft, nontender, non distended  Extremities:  No peripheral edema.  Neurologic: Nonfocal, moving all four extremities  Skin: No lesions  Access: Lt AVF    Basic Metabolic Panel: Recent Labs  Lab 12/15/21 1203 12/16/21 0525  NA 137 136  K 4.9 4.9  CL 97* 98  CO2 25 27  GLUCOSE 86 74  BUN 51* 55*  CREATININE 6.91* 7.86*  CALCIUM 10.2 9.8    Liver Function Tests: Recent Labs  Lab 12/15/21 1203  AST 28  ALT 20  ALKPHOS 61  BILITOT 0.7  PROT 6.8  ALBUMIN 3.6   No results for input(s): "LIPASE", "AMYLASE" in the last 168 hours. No results for input(s): "AMMONIA" in the last 168 hours.  CBC: Recent Labs  Lab 12/15/21 1203 12/16/21 0525  WBC 3.7* 3.1*  HGB 11.9* 11.4*  HCT 36.1* 33.3*  MCV 102.0* 96.8  PLT 148* 161    Cardiac Enzymes: No results for input(s): "CKTOTAL", "CKMB", "CKMBINDEX", "TROPONINI" in the last 168 hours.  BNP: Invalid input(s): "POCBNP"  CBG: Recent Labs  Lab 12/15/21 2053  GLUCAP 91    Microbiology: Results for orders placed or performed during the hospital encounter of 12/15/21  SARS Coronavirus 2 by RT PCR (hospital order, performed in [facility] hospital lab) *cepheid single result test* Anterior Nasal Swab     Status: None   Collection Time: 12/15/21  5:50 PM   Specimen: Anterior Nasal Swab  Result Value Ref Range Status   SARS Coronavirus 2  by RT PCR NEGATIVE NEGATIVE Final    Comment: (NOTE) SARS-CoV-2 target nucleic acids are NOT DETECTED.  The SARS-CoV-2 RNA is generally detectable in upper and lower respiratory specimens during the acute phase of infection. The lowest concentration of SARS-CoV-2 viral copies this assay can detect is 250 copies / mL. A negative result does not preclude SARS-CoV-2 infection and should not be used as the sole basis for treatment or other patient management decisions.  A  negative result may occur with improper specimen collection / handling, submission of specimen other than nasopharyngeal swab, presence of viral mutation(s) within the areas targeted by this assay, and inadequate number of viral copies (<250 copies / mL). A negative result must be combined with clinical observations, patient history, and epidemiological information.  Fact Sheet for Patients:   https://www.patel.info/  Fact Sheet for Healthcare Providers: https://hall.com/  This test is not yet approved or  cleared by the [place] FDA and has been authorized for detection and/or diagnosis of SARS-CoV-2 by FDA under an Emergency Use Authorization (EUA).  This EUA will remain in effect (meaning this test can be used) for the duration of the COVID-19 declaration under Section 564(b)(1) of the Act, 21 U.S.C. section 360bbb-3(b)(1), unless the authorization is terminated or revoked sooner.  Performed at [facility], [address] [place], [place] [address]   MRSA Next Gen by PCR, Nasal     Status: None   Collection Time: 12/15/21  9:03 PM   Specimen: Nasal Mucosa; Nasal Swab  Result Value Ref Range Status   MRSA by PCR Next Gen NOT DETECTED NOT DETECTED Final    Comment: (NOTE) The GeneXpert MRSA Assay (FDA approved for NASAL specimens only), is one component of a comprehensive MRSA colonization surveillance program. It is not intended to diagnose MRSA infection nor to guide or monitor treatment for MRSA infections. Test performance is not FDA approved in patients less than 46 years old. Performed at [facility], [address] [place], [place] [address]     Coagulation Studies: No results for input(s): "LABPROT", "INR" in the last 72 hours.  Urinalysis: No results for input(s): "COLORURINE", "LABSPEC", "PHURINE", "GLUCOSEU", "HGBUR", "BILIRUBINUR", "KETONESUR", "PROTEINUR", "UROBILINOGEN", "NITRITE",  "LEUKOCYTESUR" in the last 72 hours.  Invalid input(s): "APPERANCEUR"    Imaging: CT HEAD WO CONTRAST (5MM)  Result Date: 12/15/2021 CLINICAL DATA:  Altered mental status EXAM: CT HEAD WITHOUT CONTRAST TECHNIQUE: Contiguous axial images were obtained from the base of the skull through the vertex without intravenous contrast. RADIATION DOSE REDUCTION: This exam was performed according to the departmental dose-optimization program which includes automated exposure control, adjustment of the mA and/or kV according to patient size and/or use of iterative reconstruction technique. COMPARISON:  None Available. FINDINGS: Brain: No evidence of acute cortical infarction, hemorrhage, cerebral edema, mass, mass effect, or midline shift. No hydrocephalus or extra-axial fluid collection. 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 Periventricular white matter changes, likely the sequela of chronic small vessel ischemic disease. Vascular: No hyperdense vessel. Skull: Normal. Negative for fracture or focal lesion. Sinuses/Orbits: No acute finding. Other: The mastoid air cells are well aerated. IMPRESSION: No acute intracranial process. Electronically Signed   By: Merilyn Baba M.D.   On: 12/15/2021 14:22   DG Chest Portable 1 View  Result Date: 12/15/2021 CLINICAL DATA:  Altered mental status. EXAM: PORTABLE CHEST 1 VIEW COMPARISON:  None Available. FINDINGS: Mild cardiomegaly is noted. Right internal jugular dialysis catheter is noted with distal  tip in expected position of cavoatrial junction. Both lungs are clear. The visualized skeletal structures are unremarkable. IMPRESSION: No active disease. Electronically Signed   By: Marijo Conception M.D.   On: 12/15/2021 14:18     Medications:    anticoagulant sodium citrate     niCARDipine 5 mg/hr (12/16/21 1513)    amLODipine  10 mg Oral q morning   calcium acetate  1,334 mg Oral TID WC   carbamazepine  1,000 mg Oral q AM    Chlorhexidine Gluconate Cloth  6 each Topical Q0600   cloNIDine  0.3 mg Oral TID   ferrous sulfate  325 mg Oral Q breakfast   fluPHENAZine  2.5 mg Oral q AM   heparin  5,000 Units Subcutaneous Q8H   hydrALAZINE  100 mg Oral TID   isosorbide mononitrate  60 mg Oral BID   losartan  100 mg Oral Daily   mometasone-formoterol  2 puff Inhalation BID   pantoprazole  40 mg Oral Daily   trihexyphenidyl  2 mg Oral BID WC   acetaminophen **OR** acetaminophen, albuterol, alteplase, anticoagulant sodium citrate, heparin, hydrALAZINE, lidocaine (PF), lidocaine-prilocaine, ondansetron **OR** ondansetron (ZOFRAN) IV, mouth rinse, pentafluoroprop-tetrafluoroeth, polyethylene glycol, senna-docusate  Assessment/ Plan:  Mr. Dmonte Maher is a 61 y.o.  male with past medical history including hypertension, anemia, schizophrenia and end-stage renal disease on hemodialysis.  Patient has presented to the ED with altered mental status.  Patient has been admitted for Altered mental status [R41.82] Hypertensive urgency [I16.0] Severe hypertension [I10] Altered mental status, unspecified altered mental status type [R41.82]   End-stage renal disease on hemodialysis.  Will maintain outpatient schedule if possible.  Last dialysis treatment received on Friday.  Will provide treatment today.  Next treatment scheduled for tomorrow.  2. Anemia of chronic kidney disease Lab Results  Component Value Date   HGB 11.4 (L) 12/16/2021    Hgb at goal  3. Secondary Hyperparathyroidism:  Lab Results  Component Value Date   CALCIUM 9.8 12/16/2021    Calcium within acceptable range.  Continue calcium acetate with meals.  4.  Hypertension with chronic kidney disease.  Home regimen includes amlodipine, clonidine, hydralazine, isosorbide, and losartan.  Currently receiving these medications along with a nicardipine drip due to elevated blood pressure on arrival.  LOS: 1   7/18/20233:59 PM

## 2021-12-16 NOTE — TOC Initial Note (Signed)
Transition of Care [facility]) - Initial/Assessment Note    Patient Details  Name: Todd Brown MRN: [id number] Date of Birth: Aug 22, 1960  [facility] CM/SW Contact:    Alberteen Sam, LCSW Phone Number: 12/16/2021, 2:17 PM  Clinical Narrative:                  Per chart review patient is from [place], now [place] [phone], [phone])  Shirlee Limerick [phone] is contact at [place].   Hal Hope is legal guardian with DSS [phone] Social Worker contact with DSS is Massie Kluver at [phone]  Per RN, consents obtained for patient to receive HD.   TOC will follow for discharge planning pending medical appropriateness. Please consult TOC for additional needs.     Expected Discharge Plan:  (TBD) Barriers to Discharge: Continued Medical Work up   Patient Goals and CMS Choice   CMS Medicare.gov Compare Post Acute Care list provided to:: Legal Guardian    Expected Discharge Plan and Services Expected Discharge Plan:  (TBD)       Living arrangements for the past 2 months: [place])                                      Prior Living Arrangements/Services Living arrangements for the past 2 months: [facility]isted [facility])                     Activities of Daily Living      Permission Sought/Granted                  Emotional Assessment              Admission diagnosis:  Altered mental status [R41.82] Hypertensive urgency [I16.0] Severe hypertension [I10] Altered mental status, unspecified altered mental status type [R41.82] Patient Active Problem List   Diagnosis Date Noted   Acute metabolic encephalopathy 40/34/7425   Essential hypertension 12/15/2021   Hypertensive emergency 12/15/2021   ESRD on dialysis [facility]) 12/15/2021   NSTEMI (non-ST elevated myocardial infarction) ([place]) 12/15/2021   PCP:  Jodi Marble, MD Pharmacy:  No Pharmacies  Listed    Social Determinants of Health (SDOH) Interventions    Readmission Risk Interventions     No data to display

## 2021-12-16 NOTE — Progress Notes (Addendum)
[facility] Progress Note  Patient: Todd Brown    IPJ:[id number]  DOA: 12/15/2021    Date of Service: the patient was seen and examined on 12/16/2021  Brief hospital course: 61 year old male with past medical history of hypertension and end-stage renal disease on hemodialysis presented to the emergency room from his group home/skilled nursing facility on 7/17 with confusion.  (Patient's baseline is that he is unable to care for himself and has a legal guardian.)  Patient last received dialysis on Friday, 7/14.  From being in the emergency room on 7/17, unable to get dialysis yesterday.  Patient found to have markedly elevated blood pressure with a systolic in the 734L.  Patient with questionable cardiac ischemia as well.  CT scan of head and neurological exam unremarkable.  Patient was admitted to the hospitalist service and started on his oral antihypertensives, nicardipine drip and given IV labetalol and as needed IV hydralazine.  Nephrology consulted and plans to take patient for dialysis tomorrow.  Blood pressures by early morning of 7/18 markedly improved with systolic ranging from the 120s to 140s, but since then have started to trend back up into the 150s to 160s.  Assessment and Plan: Assessment and Plan: * Acute metabolic encephalopathy Nursing left message for guardian.  Patient is unable to care for self.  Possibly mental health issues.  It is unclear if he is verbal.  Currently is quite drowsy, but is able to awaken and wants to be left alone.Continue home Tegretol and Artane.  Hypertensive emergency In part from needing dialysis yesterday.  On nicardipine drip has improved, however trying to wean off of that.  Added scheduled hydralazine.  Resumed all of patient's home medications as well.   ESRD on dialysis [facility]) Nephrology consulted, dialysis tomorrow.  Essential hypertension - I have resumed amlodipine 10 mg daily, clonidine 0.3 mg p.o. 3 times daily, hydralazine 100 mg  p.o. 3 times daily, isosorbide mononitrate 60 mg p.o. twice daily, losartan 100 mg daily       Body mass index is 21.65 kg/m.        Consultants: Nephrology  Procedures: Planned hemodialysis 7/19  Antimicrobials: None  Code Status: Full code   Subjective: Patient somnolent, awakens at times.  Objective: Elevated blood pressures, since improved Vitals:   12/16/21 1615 12/16/21 1630  BP: (!) 168/128 (!) 153/122  Pulse: 69 72  Resp: 12 13  Temp:    SpO2: 93% 94%    Intake/Output Summary (Last 24 hours) at 12/16/2021 1632 Last data filed at 12/16/2021 1513 Gross per 24 hour  Intake 621.93 ml  Output --  Net 621.93 ml   Filed Weights   12/15/21 2050 12/16/21 1530  Weight: 70.6 kg 72.4 kg   Body mass index is 21.65 kg/m.  Exam:  General: Somnolent HEENT: Normocephalic and atraumatic, mucous membranes slightly dry Cardiovascular: Regular rate and rhythm, S1-S2, 2 out of 6 systolic ejection murmur Respiratory: Clear to auscultation bilaterally Abdomen: Soft, nontender, nondistended, hypoactive bowel sounds Musculoskeletal: No clubbing or cyanosis, trace pitting edema Skin: No skin breaks, tears or lesions Psychiatry: Withdrawn, flattened affect Neurology: Difficult to assess secondary to compliance  Data Reviewed: Minimally elevated procalcitonin.  White blood cell count of 3.1 and stable hemoglobin.  Disposition:  Status is: Inpatient Remains inpatient appropriate because: Work-up of elevated blood pressure    Anticipated discharge date: 7/20  Family Communication: No family, message left for guardian DVT Prophylaxis: heparin injection 5,000 Units Start: 12/15/21 1645 Place TED hose Start: 12/15/21 1629  Author: Annita Brod ,MD 12/16/2021 4:32 PM  To reach On-call, see care teams to locate the attending and reach out via [email or web address]. Between 7PM-7AM, please contact night-coverage If you still have difficulty reaching the attending  provider, please page the [facility] (Director on Call) for [facility] on amion for assistance.

## 2021-12-16 NOTE — Consult Note (Signed)
1320: Attempted to consult on patient. Privacy curtain pulled around patient's room. Will message pt's RN to alert writer when patient is available for consult.  Sherlon Handing, PMHNP

## 2021-12-16 NOTE — Progress Notes (Signed)
Left voicemail for legal guardian Candace at [employer] Dept. Stating we need consent for patient to receive HD today.

## 2021-12-16 NOTE — Consult Note (Signed)
Contacted patient's bedside RN to inquire about patient's alertness. Patient is making 1- word responses, inconsistently. Will attempt consult tomorrow.   Sherlon Handing, PMHNP

## 2021-12-16 NOTE — Progress Notes (Signed)
Additional contact for patient, provided by [facility], Shirlee Limerick [phone].

## 2021-12-16 NOTE — Progress Notes (Addendum)
Spoke to [place] (Previously [name]). Staff reports a mental and physical decline over weekend. Contact info: [phone] or administration at [phone]  Information relayed to MD [place] Pink via secure chat   Verbal consent for HD obtained from [facility] administration. This RN and RN Raquel Sarna signed on Consent form after speaking to facility administration.

## 2021-12-16 NOTE — Progress Notes (Addendum)
Additional contact information provided by [facility] Fax: [phone] Cell: [phone]  Social Worker on patient's case at [place]: Massie Kluver  Cell:[phone]  Patient contact information updated in chart

## 2021-12-17 ENCOUNTER — Inpatient Hospital Stay: Payer: Medicaid Other

## 2021-12-17 DIAGNOSIS — I10 Essential (primary) hypertension: Secondary | ICD-10-CM | POA: Diagnosis not present

## 2021-12-17 DIAGNOSIS — T797XXA Traumatic subcutaneous emphysema, initial encounter: Secondary | ICD-10-CM | POA: Clinically undetermined

## 2021-12-17 DIAGNOSIS — G9341 Metabolic encephalopathy: Secondary | ICD-10-CM

## 2021-12-17 DIAGNOSIS — N186 End stage renal disease: Secondary | ICD-10-CM | POA: Diagnosis not present

## 2021-12-17 LAB — HEPATITIS B SURFACE ANTIBODY, QUANTITATIVE: Hep B S AB Quant (Post): 70.2 m[IU]/mL (ref >9.9)

## 2021-12-17 LAB — GLUCOSE, CAPILLARY: Glucose-Capillary: 87 mg/dL (ref 70–99)

## 2021-12-17 LAB — PHOSPHORUS: Phosphorus: 2.9 mg/dL (ref 2.5–4.6)

## 2021-12-17 MED ORDER — HALOPERIDOL LACTATE 5 MG/ML IJ SOLN
1.0000 mg | Freq: Four times a day (QID) | INTRAMUSCULAR | Status: DC | PRN
Start: 2021-12-17 — End: 2021-12-23
  Administered 2021-12-20 – 2021-12-22 (×2): 1 mg via INTRAVENOUS
  Filled 2021-12-17 (×2): qty 1

## 2021-12-17 MED ORDER — METHYLPREDNISOLONE SODIUM SUCC 125 MG IJ SOLR
125.0000 mg | Freq: Two times a day (BID) | INTRAMUSCULAR | Status: DC
  Administered 2021-12-17 – 2021-12-20 (×6): 125 mg via INTRAVENOUS
  Filled 2021-12-17 (×6): qty 2

## 2021-12-17 MED ORDER — HALOPERIDOL LACTATE 5 MG/ML IJ SOLN
2.0000 mg | Freq: Every day | INTRAMUSCULAR | Status: DC
Start: 2021-12-17 — End: 2021-12-23
  Administered 2021-12-17 – 2021-12-18 (×2): 2 mg via INTRAVENOUS
  Filled 2021-12-17 (×2): qty 1

## 2021-12-17 MED ORDER — DIPHENHYDRAMINE HCL 50 MG/ML IJ SOLN
50.0000 mg | Freq: Four times a day (QID) | INTRAMUSCULAR | Status: DC
  Administered 2021-12-17 – 2021-12-23 (×22): 50 mg via INTRAVENOUS
  Filled 2021-12-17 (×22): qty 1

## 2021-12-17 MED ORDER — HYDRALAZINE HCL 20 MG/ML IJ SOLN
20.0000 mg | Freq: Four times a day (QID) | INTRAMUSCULAR | Status: DC
  Administered 2021-12-17 – 2021-12-18 (×3): 20 mg via INTRAVENOUS
  Filled 2021-12-17 (×4): qty 1

## 2021-12-17 NOTE — Progress Notes (Signed)
[facility]  ROUNDING NOTE   Subjective:   Todd Brown is a 61 year old male with past medical history including hypertension, anemia, schizophrenia and end-stage renal disease on hemodialysis.  Patient has presented to the ED with altered mental status.  Patient has been admitted for Altered mental status [R41.82] Hypertensive urgency [I16.0] Severe hypertension [I10] Altered mental status, unspecified altered mental status type [R41.82]  Patient is known to our practice and receives outpatient dialysis treatments at [facility] on a MWF schedule.  Update Patient seen and evaluated at bedside in ICU More alert and responding to simple questioning today Denies pain or discomfort Remains on room air Blood pressure remains elevated, continues nicardipine drip No lower extremity edema   Objective:  Vital signs in last 24 hours:  Temp:  [97.3 F (36.3 C)-97.6 F (36.4 C)] 97.6 F (36.4 C) (07/19 0730) Pulse Rate:  [59-85] 67 (07/19 1115) Resp:  [10-24] 16 (07/19 1115) BP: (105-250)/(55-219) 170/83 (07/19 1115) SpO2:  [90 %-97 %] 96 % (07/19 1115) Weight:  [71.4 kg-72.4 kg] 71.4 kg (07/18 1949)  Weight change: 1.8 kg Filed Weights   12/15/21 2050 12/16/21 1530 12/16/21 1949  Weight: 70.6 kg 72.4 kg 71.4 kg    Intake/Output: I/O last 3 completed shifts: In: 1388.6 [I.V.:1388.6] Out: 1000 [Other:1000]   Intake/Output this shift:  Total I/O In: 190.8 [I.V.:190.8] Out: -   Physical Exam: General: ill-appearing  Head: Normocephalic, atraumatic. Moist oral mucosal membranes  Eyes: Anicteric  Lungs:  Clear to auscultation, normal effort, room air  Heart: Regular rate and rhythm  Abdomen:  Soft, nontender, non distended  Extremities:  No peripheral edema.  Neurologic: Nonfocal, moving all four extremities  Skin: No lesions  Access: Lt AVF    Basic Metabolic Panel: Recent Labs  Lab 12/15/21 1203 12/16/21 0525  NA 137 136  K 4.9 4.9  CL 97*  98  CO2 25 27  GLUCOSE 86 74  BUN 51* 55*  CREATININE 6.91* 7.86*  CALCIUM 10.2 9.8     Liver Function Tests: Recent Labs  Lab 12/15/21 1203  AST 28  ALT 20  ALKPHOS 61  BILITOT 0.7  PROT 6.8  ALBUMIN 3.6    No results for input(s): "LIPASE", "AMYLASE" in the last 168 hours. No results for input(s): "AMMONIA" in the last 168 hours.  CBC: Recent Labs  Lab 12/15/21 1203 12/16/21 0525  WBC 3.7* 3.1*  HGB 11.9* 11.4*  HCT 36.1* 33.3*  MCV 102.0* 96.8  PLT 148* 161     Cardiac Enzymes: No results for input(s): "CKTOTAL", "CKMB", "CKMBINDEX", "TROPONINI" in the last 168 hours.  BNP: Invalid input(s): "POCBNP"  CBG: Recent Labs  Lab 12/15/21 2053  GLUCAP 91     Microbiology: Results for orders placed or performed during the hospital encounter of 12/15/21  SARS Coronavirus 2 by RT PCR (hospital order, performed in [facility] hospital lab) *cepheid single result test* Anterior Nasal Swab     Status: None   Collection Time: 12/15/21  5:50 PM   Specimen: Anterior Nasal Swab  Result Value Ref Range Status   SARS Coronavirus 2 by RT PCR NEGATIVE NEGATIVE Final    Comment: (NOTE) SARS-CoV-2 target nucleic acids are NOT DETECTED.  The SARS-CoV-2 RNA is generally detectable in upper and lower respiratory specimens during the acute phase of infection. The lowest concentration of SARS-CoV-2 viral copies this assay can detect is 250 copies / mL. A negative result does not preclude SARS-CoV-2 infection and should not be used  as the sole basis for treatment or other patient management decisions.  A negative result may occur with improper specimen collection / handling, submission of specimen other than nasopharyngeal swab, presence of viral mutation(s) within the areas targeted by this assay, and inadequate number of viral copies (<250 copies / mL). A negative result must be combined with clinical observations, patient history, and epidemiological  information.  Fact Sheet for Patients:   https://www.patel.info/  Fact Sheet for Healthcare Providers: https://hall.com/  This test is not yet approved or  cleared by the [place] FDA and has been authorized for detection and/or diagnosis of SARS-CoV-2 by FDA under an Emergency Use Authorization (EUA).  This EUA will remain in effect (meaning this test can be used) for the duration of the COVID-19 declaration under Section 564(b)(1) of the Act, 21 U.S.C. section 360bbb-3(b)(1), unless the authorization is terminated or revoked sooner.  Performed at [facility], [address] [place], [place] [address]   MRSA Next Gen by PCR, Nasal     Status: None   Collection Time: 12/15/21  9:03 PM   Specimen: Nasal Mucosa; Nasal Swab  Result Value Ref Range Status   MRSA by PCR Next Gen NOT DETECTED NOT DETECTED Final    Comment: (NOTE) The GeneXpert MRSA Assay (FDA approved for NASAL specimens only), is one component of a comprehensive MRSA colonization surveillance program. It is not intended to diagnose MRSA infection nor to guide or monitor treatment for MRSA infections. Test performance is not FDA approved in patients less than 71 years old. Performed at [facility], [address] [place], [place] [address]     Coagulation Studies: No results for input(s): "LABPROT", "INR" in the last 72 hours.  Urinalysis: No results for input(s): "COLORURINE", "LABSPEC", "PHURINE", "GLUCOSEU", "HGBUR", "BILIRUBINUR", "KETONESUR", "PROTEINUR", "UROBILINOGEN", "NITRITE", "LEUKOCYTESUR" in the last 72 hours.  Invalid input(s): "APPERANCEUR"    Imaging: CT HEAD WO CONTRAST (5MM)  Result Date: 12/15/2021 CLINICAL DATA:  Altered mental status EXAM: CT HEAD WITHOUT CONTRAST TECHNIQUE: Contiguous axial images were obtained from the base of the skull through the vertex without intravenous contrast. RADIATION DOSE REDUCTION: This  exam was performed according to the departmental dose-optimization program which includes automated exposure control, adjustment of the mA and/or kV according to patient size and/or use of iterative reconstruction technique. COMPARISON:  None Available. FINDINGS: Brain: No evidence of acute cortical infarction, hemorrhage, cerebral edema, mass, mass effect, or midline shift. No hydrocephalus or extra-axial fluid collection. 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 Periventricular white matter changes, likely the sequela of chronic small vessel ischemic disease. Vascular: No hyperdense vessel. Skull: Normal. Negative for fracture or focal lesion. Sinuses/Orbits: No acute finding. Other: The mastoid air cells are well aerated. IMPRESSION: No acute intracranial process. Electronically Signed   By: Merilyn Baba M.D.   On: 12/15/2021 14:22   DG Chest Portable 1 View  Result Date: 12/15/2021 CLINICAL DATA:  Altered mental status. EXAM: PORTABLE CHEST 1 VIEW COMPARISON:  None Available. FINDINGS: Mild cardiomegaly is noted. Right internal jugular dialysis catheter is noted with distal tip in expected position of cavoatrial junction. Both lungs are clear. The visualized skeletal structures are unremarkable. IMPRESSION: No active disease. Electronically Signed   By: Marijo Conception M.D.   On: 12/15/2021 14:18     Medications:    anticoagulant sodium citrate     niCARDipine 2.5 mg/hr (12/17/21 1100)    amLODipine  10 mg Oral q morning  calcium acetate  1,334 mg Oral TID WC   carbamazepine  1,000 mg Oral q AM   Chlorhexidine Gluconate Cloth  6 each Topical Q0600   cloNIDine  0.3 mg Oral TID   ferrous sulfate  325 mg Oral Q breakfast   fluPHENAZine  2.5 mg Oral q AM   heparin  5,000 Units Subcutaneous Q8H   hydrALAZINE  100 mg Oral TID   isosorbide mononitrate  60 mg Oral BID   losartan  100 mg Oral Daily   mometasone-formoterol  2 puff Inhalation BID    pantoprazole  40 mg Oral Daily   trihexyphenidyl  2 mg Oral BID WC   acetaminophen **OR** acetaminophen, albuterol, alteplase, anticoagulant sodium citrate, heparin, hydrALAZINE, lidocaine (PF), lidocaine-prilocaine, ondansetron **OR** ondansetron (ZOFRAN) IV, mouth rinse, pentafluoroprop-tetrafluoroeth, polyethylene glycol, senna-docusate  Assessment/ Plan:  Todd Brown is a 61 y.o.  male with past medical history including hypertension, anemia, schizophrenia and end-stage renal disease on hemodialysis.  Patient has presented to the ED with altered mental status.  Patient has been admitted for Altered mental status [R41.82] Hypertensive urgency [I16.0] Severe hypertension [I10] Altered mental status, unspecified altered mental status type [R41.82]   End-stage renal disease on hemodialysis.  Will maintain outpatient schedule if possible.    Patient received dialysis treatment yesterday, tolerated well.  UF 1 L achieved.  Patient receives regular scheduled dialysis today to maintain outpatient schedule.  Next treatment scheduled for Friday.  2. Anemia of chronic kidney disease Lab Results  Component Value Date   HGB 11.4 (L) 12/16/2021    Hemoglobin remains within acceptable target.  Patient receives [place] outpatient.  We will continue to monitor.  3. Secondary Hyperparathyroidism:  Lab Results  Component Value Date   CALCIUM 9.8 12/16/2021    Calcium remains within acceptable range.  We will add phosphorus to a.m. labs.  4.  Hypertension with chronic kidney disease.  Home regimen includes amlodipine, clonidine, hydralazine, isosorbide, and losartan.    Patient remains on nicardipine drip, blood pressure currently 170/83.   LOS: 2   7/19/202311:31 AM

## 2021-12-17 NOTE — Progress Notes (Signed)
Unable to perform an adequate assessment on this patient d/t agitation and combativeness. Patient allowed this nurse to listen to lungs and administer heparin subQ before becoming irritated. When attempting to hand him his PO meds he smacked my face. He has had similar combative responses to every bit of stimulation this evenining. Dr. Sidney Ace is aware.

## 2021-12-17 NOTE — Consult Note (Signed)
1300: Attempted to consult, once again, on this patient. Writer spoke with patient's bedside RN, who states that patient has been sleeping most of day and only response he gives is his name and that he cannot see. Patient remains in ICU undergoing treatment. Bedside RN states he is not taking any medications by mouth.  Will make attempt to consult at later time.   Sherlon Handing, PMHNP

## 2021-12-17 NOTE — Progress Notes (Signed)
[facility] Progress Note  Patient: Todd Brown    GUY:[id number]  DOA: 12/15/2021    Date of Service: the patient was seen and examined on 12/17/2021  Brief hospital course: 61 year old male with past medical history of hypertension and end-stage renal disease on hemodialysis presented to the emergency room from his group home/skilled nursing facility on 7/17 with confusion.  (Patient's baseline is that he is unable to care for himself and has a legal guardian.)  Patient last received dialysis on Friday, 7/14.  From being in the emergency room on 7/17, unable to get dialysis yesterday.  Patient found to have markedly elevated blood pressure with a systolic in the 563O.  Patient with questionable cardiac ischemia as well.  CT scan of head and neurological exam unremarkable.  Patient was admitted to the hospitalist service and started on his oral antihypertensives, nicardipine drip and given IV labetalol and as needed IV hydralazine.  Nephrology consulted and plans to take patient for dialysis tomorrow.  Attempts to wean off Cardene have led to increasing high blood pressure, compounded by patient refusing to take oral medications.  He has become more physically agitated, wanting to be left alone.  Around 1 PM, patient complaining of not being able to see.  Nursing evaluated patient noting he appears to have significant periorbital swelling causing both of his eyelids to be quite swollen to the point where he can barely open them.  This attending confirmed that this was not present yesterday or earlier this morning.  Critical care consulted to rule out angioedema felt that this may be subcutaneous emphysema.  Not felt to be drug allergy.  Patient started on IV steroids.  Assessment and Plan: Assessment and Plan: * Acute metabolic encephalopathy Nursing left message for guardian.  Patient is unable to care for self.  Possibly mental health issues, and it appears currently he is in some form of  psychosis.  It is unclear if he is verbal.  Currently is quite drowsy, but is able to awaken and wants to be left alone.  Continue home Tegretol and Artane, although patient refusing to take oral medications.  Appreciate psychiatry help.  Started on scheduled and as needed IV Haldol.  Hypertensive emergency In part from needing dialysis yesterday.  On nicardipine drip has improved, however unable to wean, especially in light of patient is not take p.o. medications.  Have started scheduled high-dose IV Haldol, with goal to try to wean off Cardizem drip altogether.  ESRD on dialysis [facility]) Nephrology consulted, full dialysis session on 7/8 and again today to keep him on his Monday Wednesday Friday schedule.  Essential hypertension Resumed on his home p.o. amlodipine 10 mg daily, clonidine 0.3 mg p.o. 3 times daily, hydralazine 100 mg p.o. 3 times daily, isosorbide mononitrate 60 mg p.o. twice daily, losartan 100 mg daily.  Currently he is refusing to take these medications.  Subcutaneous emphysema (HCC) On steroids.  Appreciate pulmonary assistance.       Body mass index is 21.2 kg/m.        Consultants: Nephrology Critical care Psychiatry  Procedures: Hemodialysis 7/18, 7/19  Antimicrobials: None  Code Status: Full code   Subjective: Complains of eye swelling and being unable to see  Objective: Elevated blood pressures, since improved Vitals:   12/17/21 1430 12/17/21 1445  BP: (!) 137/94 (!) 138/99  Pulse: 77 66  Resp: 15 12  Temp:    SpO2: 98% 98%    Intake/Output Summary (Last 24 hours) at 12/17/2021 1505 Last  data filed at 12/17/2021 1400 Gross per 24 hour  Intake 1206.02 ml  Output 1000 ml  Net 206.02 ml   Filed Weights   12/16/21 1530 12/16/21 1949 12/17/21 1348  Weight: 72.4 kg 71.4 kg 70.9 kg   Body mass index is 21.2 kg/m.  Exam:  General: Noted periorbital swelling HEENT: Normocephalic and atraumatic, mucous membranes slightly  dry Cardiovascular: Regular rate and rhythm, S1-S2, 2 out of 6 systolic ejection murmur Respiratory: Clear to auscultation bilaterally Abdomen: Soft, nontender, nondistended, hypoactive bowel sounds Musculoskeletal: No clubbing or cyanosis, trace pitting edema Skin: No skin breaks, tears or lesions Psychiatry: Withdrawn, flattened affect Neurology: Difficult to assess secondary to compliance  Data Reviewed: Refused blood work today  Disposition:  Status is: Inpatient Remains inpatient appropriate because: Work-up of elevated blood pressure    Anticipated discharge date: 7/24  Family Communication: No family, message left for guardian DVT Prophylaxis: heparin injection 5,000 Units Start: 12/15/21 1645 Place TED hose Start: 12/15/21 1629    Author: Annita Brod ,MD 12/17/2021 3:05 PM  To reach On-call, see care teams to locate the attending and reach out via [email or web address]. Between 7PM-7AM, please contact night-coverage If you still have difficulty reaching the attending provider, please page the [facility] (Director on Call) for [facility] on amion for assistance.

## 2021-12-17 NOTE — Assessment & Plan Note (Addendum)
On steroids.  Appreciate pulmonary assistance.  Subcutaneous emphysema looks to be resolved.  Changed from Solu-Medrol to p.o. prednisone quick taper.

## 2021-12-17 NOTE — Consult Note (Signed)
   CHIEF COMPLAINT:   Chief Complaint  Patient presents with   Altered Mental Status    Subjective  I was asked to evaluate acute facial swelling upon examination Patient  has facial subcutaneous emphysema without ANGIOEDEMA Alert and awake, eye shut due to swelling, no fevers, no pain, MS improved No SOB, has intermittent wheezing      Objective       Review of Systems: Gen:  Denies  fever, sweats, chills weight loss  HEENT: facial swelling Cardiac:  No dizziness, chest pain or heaviness, chest tightness,edema, No JVD Resp:   No cough, -sputum production, -shortness of breath,+wheezing, -hemoptysis,  Other:  All other systems negative   Physical Examination:   General Appearance: No distress  EYES periorbital swelling c/w Subcut emphysema NECK Supple, No JVD Pulmonary: normal breath sounds, +wheezing.  CardiovascularNormal S1,S2.  No m/r/g.   Abdomen: Benign, Soft, non-tender. PSYCHIATRIC: oriented to  person    VITALS:  height is 6' (1.829 m) and weight is 71.4 kg. His axillary temperature is 97.6 F (36.4 C). His blood pressure is 170/83 (abnormal) and his pulse is 67. His respiration is 16 and oxygen saturation is 96%.   I personally reviewed Labs under Results section.  Radiology Reports CT HEAD WO CONTRAST (5MM)  Result Date: 12/15/2021 CLINICAL DATA:  Altered mental status EXAM: CT HEAD WITHOUT CONTRAST TECHNIQUE: Contiguous axial images were obtained from the base of the skull through the vertex without intravenous contrast. RADIATION DOSE REDUCTION: This exam was performed according to the departmental dose-optimization program which includes automated exposure control, adjustment of the mA and/or kV according to patient size and/or use of iterative reconstruction technique. COMPARISON:  None Available. FINDINGS: Brain: No evidence of acute cortical infarction, hemorrhage, cerebral edema, mass, mass effect, or midline shift. No hydrocephalus or extra-axial  fluid collection. 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 Periventricular white matter changes, likely the sequela of chronic small vessel ischemic disease. Vascular: No hyperdense vessel. Skull: Normal. Negative for fracture or focal lesion. Sinuses/Orbits: No acute finding. Other: The mastoid air cells are well aerated. IMPRESSION: No acute intracranial process. Electronically Signed   By: Merilyn Baba M.D.   On: 12/15/2021 14:22   DG Chest Portable 1 View  Result Date: 12/15/2021 CLINICAL DATA:  Altered mental status. EXAM: PORTABLE CHEST 1 VIEW COMPARISON:  None Available. FINDINGS: Mild cardiomegaly is noted. Right internal jugular dialysis catheter is noted with distal tip in expected position of cavoatrial junction. Both lungs are clear. The visualized skeletal structures are unremarkable. IMPRESSION: No active disease. Electronically Signed   By: Marijo Conception M.D.   On: 12/15/2021 14:18       Assessment/Plan:  Acute facial SUBCUTANEOUS EMPHYSEMA WITHOUT ANGIOEDEMA ?etiology likely from underlying reactive airways disease Doubt drug allergy Supportive care Will and should dissipate with time No reason for IV abx Already ion steroids   Please call with any issues Will follow along as needed    Corrin Parker, M.D.  [facility]  Medical Director [place] Director [facility] Cardio-Pulmonary Department

## 2021-12-18 DIAGNOSIS — T797XXA Traumatic subcutaneous emphysema, initial encounter: Secondary | ICD-10-CM | POA: Diagnosis not present

## 2021-12-18 DIAGNOSIS — I161 Hypertensive emergency: Secondary | ICD-10-CM | POA: Diagnosis not present

## 2021-12-18 DIAGNOSIS — G9341 Metabolic encephalopathy: Secondary | ICD-10-CM | POA: Diagnosis not present

## 2021-12-18 DIAGNOSIS — N186 End stage renal disease: Secondary | ICD-10-CM | POA: Diagnosis not present

## 2021-12-18 LAB — GLUCOSE, CAPILLARY: Glucose-Capillary: 112 mg/dL — ABNORMAL HIGH (ref 70–99)

## 2021-12-18 MED ORDER — HYDRALAZINE HCL 50 MG PO TABS
100.0000 mg | ORAL_TABLET | Freq: Three times a day (TID) | ORAL | Status: DC
  Administered 2021-12-18 – 2021-12-21 (×9): 100 mg via ORAL
  Filled 2021-12-18 (×9): qty 2

## 2021-12-18 NOTE — Progress Notes (Signed)
[facility]  ROUNDING NOTE   Subjective:   Todd Brown is a 61 year old male with past medical history including hypertension, anemia, schizophrenia and end-stage renal disease on hemodialysis.  Patient has presented to the ED with altered mental status.  Patient has been admitted for Altered mental status [R41.82] Hypertensive urgency [I16.0] Severe hypertension [I10] Altered mental status, unspecified altered mental status type [R41.82]  Patient is known to our practice and receives outpatient dialysis treatments at [facility] on a MWF schedule.  Update Patient seen resting in bed in ICU Alert, oriented to self Remains on Nicardipine drip Dialysis UF 1L yesterday   Objective:  Vital signs in last 24 hours:  Temp:  [97.6 F (36.4 C)-98.8 F (37.1 C)] 98.8 F (37.1 C) (07/20 0730) Pulse Rate:  [53-92] 78 (07/20 0815) Resp:  [8-24] 11 (07/20 0815) BP: (129-209)/(72-106) 167/84 (07/20 0815) SpO2:  [95 %-100 %] 98 % (07/20 0815) Weight:  [70.3 kg-70.9 kg] 70.3 kg (07/19 1701)  Weight change: -1.5 kg Filed Weights   12/16/21 1949 12/17/21 1348 12/17/21 1701  Weight: 71.4 kg 70.9 kg 70.3 kg    Intake/Output: I/O last 3 completed shifts: In: 1104 [I.V.:1104] Out: 2000 [Other:2000]   Intake/Output this shift:  Total I/O In: 25 [I.V.:25] Out: -   Physical Exam: General: ill-appearing  Head: Normocephalic, atraumatic. Moist oral mucosal membranes  Eyes: Anicteric  Lungs:  Clear to auscultation, normal effort, room air  Heart: Regular rate and rhythm  Abdomen:  Soft, nontender, non distended  Extremities:  No peripheral edema.  Neurologic: Nonfocal, moving all four extremities  Skin: No lesions  Access: Lt AVF    Basic Metabolic Panel: Recent Labs  Lab 12/15/21 1203 12/16/21 0525  NA 137 136  K 4.9 4.9  CL 97* 98  CO2 25 27  GLUCOSE 86 74  BUN 51* 55*  CREATININE 6.91* 7.86*  CALCIUM 10.2 9.8  PHOS  --  2.9     Liver Function  Tests: Recent Labs  Lab 12/15/21 1203  AST 28  ALT 20  ALKPHOS 61  BILITOT 0.7  PROT 6.8  ALBUMIN 3.6    No results for input(s): "LIPASE", "AMYLASE" in the last 168 hours. No results for input(s): "AMMONIA" in the last 168 hours.  CBC: Recent Labs  Lab 12/15/21 1203 12/16/21 0525  WBC 3.7* 3.1*  HGB 11.9* 11.4*  HCT 36.1* 33.3*  MCV 102.0* 96.8  PLT 148* 161     Cardiac Enzymes: No results for input(s): "CKTOTAL", "CKMB", "CKMBINDEX", "TROPONINI" in the last 168 hours.  BNP: Invalid input(s): "POCBNP"  CBG: Recent Labs  Lab 12/15/21 2053 12/17/21 1942  GLUCAP 91 87     Microbiology: Results for orders placed or performed during the hospital encounter of 12/15/21  SARS Coronavirus 2 by RT PCR (hospital order, performed in [facility] hospital lab) *cepheid single result test* Anterior Nasal Swab     Status: None   Collection Time: 12/15/21  5:50 PM   Specimen: Anterior Nasal Swab  Result Value Ref Range Status   SARS Coronavirus 2 by RT PCR NEGATIVE NEGATIVE Final    Comment: (NOTE) SARS-CoV-2 target nucleic acids are NOT DETECTED.  The SARS-CoV-2 RNA is generally detectable in upper and lower respiratory specimens during the acute phase of infection. The lowest concentration of SARS-CoV-2 viral copies this assay can detect is 250 copies / mL. A negative result does not preclude SARS-CoV-2 infection and should not be used as the sole basis for treatment or  other patient management decisions.  A negative result may occur with improper specimen collection / handling, submission of specimen other than nasopharyngeal swab, presence of viral mutation(s) within the areas targeted by this assay, and inadequate number of viral copies (<250 copies / mL). A negative result must be combined with clinical observations, patient history, and epidemiological information.  Fact Sheet for Patients:   https://www.patel.info/  Fact Sheet for  Healthcare Providers: https://hall.com/  This test is not yet approved or  cleared by the [place] FDA and has been authorized for detection and/or diagnosis of SARS-CoV-2 by FDA under an Emergency Use Authorization (EUA).  This EUA will remain in effect (meaning this test can be used) for the duration of the COVID-19 declaration under Section 564(b)(1) of the Act, 21 U.S.C. section 360bbb-3(b)(1), unless the authorization is terminated or revoked sooner.  Performed at [facility], [address] [place], [place] [address]   MRSA Next Gen by PCR, Nasal     Status: None   Collection Time: 12/15/21  9:03 PM   Specimen: Nasal Mucosa; Nasal Swab  Result Value Ref Range Status   MRSA by PCR Next Gen NOT DETECTED NOT DETECTED Final    Comment: (NOTE) The GeneXpert MRSA Assay (FDA approved for NASAL specimens only), is one component of a comprehensive MRSA colonization surveillance program. It is not intended to diagnose MRSA infection nor to guide or monitor treatment for MRSA infections. Test performance is not FDA approved in patients less than 50 years old. Performed at [facility], [address] [place], [place] [address]     Coagulation Studies: No results for input(s): "LABPROT", "INR" in the last 72 hours.  Urinalysis: No results for input(s): "COLORURINE", "LABSPEC", "PHURINE", "GLUCOSEU", "HGBUR", "BILIRUBINUR", "KETONESUR", "PROTEINUR", "UROBILINOGEN", "NITRITE", "LEUKOCYTESUR" in the last 72 hours.  Invalid input(s): "APPERANCEUR"    Imaging: DG Chest Port 1 View  Result Date: 12/17/2021 CLINICAL DATA:  COPD with acute exacerbation. Altered mental status. EXAM: PORTABLE CHEST 1 VIEW COMPARISON:  AP chest 12/15/2021 trauma 10/07/2021 and 10/23/2019 FINDINGS: Right chest wall dual-lumen central venous catheter tip overlies the superior vena cava/right atrial junction. Cardiac silhouette is again mildly to moderately  enlarged. Mediastinal contours within normal limits. Mild interstitial thickening is similar to prior. No pleural effusion or pneumothorax. No acute skeletal abnormality. IMPRESSION: No significant change from prior. Mild bilateral interstitial thickening compatible with mild interstitial pulmonary edema versus chronic interstitial scarring. Electronically Signed   By: Yvonne Kendall M.D.   On: 12/17/2021 13:06     Medications:    anticoagulant sodium citrate     niCARDipine 2.5 mg/hr (12/18/21 0800)    amLODipine  10 mg Oral q morning   calcium acetate  1,334 mg Oral TID WC   carbamazepine  1,000 mg Oral q AM   Chlorhexidine Gluconate Cloth  6 each Topical Q0600   cloNIDine  0.3 mg Oral TID   diphenhydrAMINE  50 mg Intravenous Q6H   ferrous sulfate  325 mg Oral Q breakfast   fluPHENAZine  2.5 mg Oral q AM   haloperidol lactate  2 mg Intravenous Daily   heparin  5,000 Units Subcutaneous Q8H   hydrALAZINE  20 mg Intravenous Q6H   hydrALAZINE  100 mg Oral TID   isosorbide mononitrate  60 mg Oral BID   losartan  100 mg Oral Daily   methylPREDNISolone (SOLU-MEDROL) injection  125 mg Intravenous Q12H   mometasone-formoterol  2 puff Inhalation BID   pantoprazole  40 mg  Oral Daily   trihexyphenidyl  2 mg Oral BID WC   acetaminophen **OR** acetaminophen, albuterol, alteplase, anticoagulant sodium citrate, haloperidol lactate, heparin, hydrALAZINE, lidocaine (PF), lidocaine-prilocaine, ondansetron **OR** ondansetron (ZOFRAN) IV, mouth rinse, pentafluoroprop-tetrafluoroeth, polyethylene glycol, senna-docusate  Assessment/ Plan:  Mr. Selassie Spatafore is a 61 y.o.  male with past medical history including hypertension, anemia, schizophrenia and end-stage renal disease on hemodialysis.  Patient has presented to the ED with altered mental status.  Patient has been admitted for Altered mental status [R41.82] Hypertensive urgency [I16.0] Severe hypertension [I10] Altered mental status, unspecified  altered mental status type [R41.82]   End-stage renal disease on hemodialysis.  Will maintain outpatient schedule if possible.    Received dialysis yesterday, UF 1L achieve. Next treatment scheduled for Friday.  2. Anemia of chronic kidney disease Lab Results  Component Value Date   HGB 11.4 (L) 12/16/2021    Mircera prescribed outpatient.  Hgb at goal  3. Secondary Hyperparathyroidism:  Lab Results  Component Value Date   CALCIUM 9.8 12/16/2021   PHOS 2.9 12/16/2021    Calcium remains within acceptable range.    4.  Hypertension with chronic kidney disease.  Home regimen includes amlodipine, clonidine, hydralazine, isosorbide, and losartan.    Patient on nicardipine drip, blood pressure 167/84   LOS: 3   7/20/202310:59 AM

## 2021-12-18 NOTE — Consult Note (Signed)
[facility] Consult   Reason for Consult:  medication management Referring Physician:  [place] Pink Patient Identification: Todd Brown MRN:  [id number] Principal Diagnosis: Acute metabolic encephalopathy Diagnosis:  Principal Problem:   Acute metabolic encephalopathy Active Problems:   Essential hypertension   Hypertensive emergency   ESRD on dialysis ([place])   Subcutaneous emphysema ([place])   Total Time spent with patient: 20 minutes  Subjective:   Todd Brown is a 61 y.o. male patient admitted with acute metabolic encephalopathy.  HPI:  This is writer's third attempt to talk to patient. On 12/17/21, Probation officer ordered haldol IV, as patient was not taking his oral medication (prolixin). Today, patient did take prolixin. We left haldol IV ONLY if patient is not taking prolixin orally.  Haldol IV PRN for agitation.   On evaluation, patient was able to open eyes, makes fair eye contact. Writer introduced self; Patient opened his eyes and responded "Hey, hello, how are you? " Patient responds to question if he is feeling better, saying "yes, I feel better." He remains pretty sleepy. Drifts in and out of sleep. He did not respond to any other further questioning.  Per bedside RN he has not been aggressive today; responds at will to questioning.    Past Psychiatric History: unknown  Risk to Self:   Risk to Others:   Prior Inpatient Therapy:   Prior Outpatient Therapy:    Past Medical History:  Past Medical History:  Diagnosis Date   ESRD (end stage renal disease) on dialysis ([place])    Hypertension     Past Surgical History:  Procedure Laterality Date   AV FISTULA PLACEMENT     Family History:  Family History  Family history unknown: Yes   Family Psychiatric  History:  Social History:  Social History   Substance and Sexual Activity  Alcohol Use Not Currently     Social History   Substance and Sexual Activity  Drug Use Not Currently    Social History    Socioeconomic History   Marital status: Single    Spouse name: Not on file   Number of children: Not on file   Years of education: Not on file   Highest education level: Not on file  Occupational History   Not on file  Tobacco Use   Smoking status: Unknown   Smokeless tobacco: Not on file  Substance and Sexual Activity   Alcohol use: Not Currently   Drug use: Not Currently   Sexual activity: Not Currently  Other Topics Concern   Not on file  Social History Narrative   Not on file   Social Determinants of Health   Financial Resource Strain: Not on file  Food Insecurity: Not on file  Transportation Needs: Not on file  Physical Activity: Not on file  Stress: Not on file  Social Connections: Not on file   Additional Social History:    Allergies:  No Known Allergies  Labs:  Results for orders placed or performed during the hospital encounter of 12/15/21 (from the past 48 hour(s))  Glucose, capillary     Status: None   Collection Time: 12/17/21  7:42 PM  Result Value Ref Range   Glucose-Capillary 87 70 - 99 mg/dL    Comment: Glucose reference range applies only to samples taken after fasting for at least 8 hours.    Current Facility-Administered Medications  Medication Dose Route Frequency Provider Last Rate Last Admin   acetaminophen (TYLENOL) tablet 650 mg  650 mg Oral Q6H PRN Cox,  Amy N, DO       Or   acetaminophen (TYLENOL) suppository 650 mg  650 mg Rectal Q6H PRN Cox, Amy N, DO       albuterol (PROVENTIL) (2.5 MG/3ML) 0.083% nebulizer solution 3 mL  3 mL Inhalation Q4H PRN Cox, Amy N, DO       alteplase (CATHFLO ACTIVASE) injection 2 mg  2 mg Intracatheter Once PRN Colon Flattery, NP       amLODipine (NORVASC) tablet 10 mg  10 mg Oral q morning Cox, Amy N, DO   10 mg at 12/18/21 1884   anticoagulant sodium citrate solution 5 mL  5 mL Intracatheter PRN Colon Flattery, NP       calcium acetate (PHOSLO) capsule 1,334 mg  1,334 mg Oral TID WC Cox, Amy N, DO    1,334 mg at 12/16/21 0913   carbamazepine (TEGRETOL) tablet 1,000 mg  1,000 mg Oral q AM Cox, Amy N, DO   1,000 mg at 12/16/21 1660   Chlorhexidine Gluconate Cloth 2 % PADS 6 each  6 each Topical Q0600 Cox, Amy N, DO   6 each at 12/18/21 0935   cloNIDine (CATAPRES) tablet 0.3 mg  0.3 mg Oral TID Cox, Amy N, DO   0.3 mg at 12/18/21 0934   diphenhydrAMINE (BENADRYL) injection 50 mg  50 mg Intravenous Q6H Annita Brod, MD   50 mg at 12/18/21 6301   ferrous sulfate tablet 325 mg  325 mg Oral Q breakfast Cox, Amy N, DO   325 mg at 12/16/21 6010   fluPHENAZine (PROLIXIN) tablet 2.5 mg  2.5 mg Oral q AM Cox, Amy N, DO   2.5 mg at 12/18/21 0932   haloperidol lactate (HALDOL) injection 1 mg  1 mg Intravenous Q6H PRN Oswald Hillock, RPH       haloperidol lactate (HALDOL) injection 2 mg  2 mg Intravenous Daily Waldon Merl F, NP   2 mg at 12/18/21 9323   heparin injection 1,000 Units  1,000 Units Intracatheter PRN Colon Flattery, NP       heparin injection 5,000 Units  5,000 Units Subcutaneous Q8H Cox, Amy N, DO   5,000 Units at 12/18/21 0503   hydrALAZINE (APRESOLINE) injection 10 mg  10 mg Intravenous Q6H PRN Mansy, Jan A, MD   10 mg at 12/17/21 1204   hydrALAZINE (APRESOLINE) tablet 100 mg  100 mg Oral TID Lorna Dibble, RPH       isosorbide mononitrate (IMDUR) 24 hr tablet 60 mg  60 mg Oral BID Cox, Amy N, DO   60 mg at 12/18/21 0930   lidocaine (PF) (XYLOCAINE) 1 % injection 5 mL  5 mL Intradermal PRN Colon Flattery, NP       lidocaine-prilocaine (EMLA) cream 1 Application  1 Application Topical PRN Colon Flattery, NP       losartan (COZAAR) tablet 100 mg  100 mg Oral Daily Cox, Amy N, DO   100 mg at 12/18/21 0931   methylPREDNISolone sodium succinate (SOLU-MEDROL) 125 mg/2 mL injection 125 mg  125 mg Intravenous Q12H Annita Brod, MD   125 mg at 12/18/21 0114   mometasone-formoterol (DULERA) 200-5 MCG/ACT inhaler 2 puff  2 puff Inhalation BID Cox, Amy N, DO   2 puff at  12/16/21 0913   nicardipine (CARDENE) 20mg  in 0.86% saline 219ml IV infusion (0.1 mg/ml)  3-15 mg/hr Intravenous Continuous Cox, Amy N, DO 25 mL/hr at 12/18/21 0800 2.5 mg/hr at 12/18/21 0800   ondansetron (  ZOFRAN) tablet 4 mg  4 mg Oral Q6H PRN Cox, Amy N, DO       Or   ondansetron (ZOFRAN) injection 4 mg  4 mg Intravenous Q6H PRN Cox, Amy N, DO       Oral care mouth rinse  15 mL Mouth Rinse PRN Cox, Amy N, DO       pantoprazole (PROTONIX) EC tablet 40 mg  40 mg Oral Daily Cox, Amy N, DO   40 mg at 12/18/21 5809   pentafluoroprop-tetrafluoroeth (GEBAUERS) aerosol 1 Application  1 Application Topical PRN Colon Flattery, NP       polyethylene glycol (MIRALAX / GLYCOLAX) packet 17 g  17 g Oral BID PRN Cox, Amy N, DO       senna-docusate (Senokot-S) tablet 1 tablet  1 tablet Oral QHS PRN Cox, Amy N, DO       trihexyphenidyl (ARTANE) tablet 2 mg  2 mg Oral BID WC Cox, Amy N, DO   2 mg at 12/16/21 0906    Musculoskeletal: Strength & Muscle Tone: decreased Gait & Station:  bedbound in ICU Patient leans: N/A  Psychiatric Specialty Exam:  Presentation  General Appearance: No data recorded Eye Contact:No data recorded Speech:No data recorded Speech Volume:No data recorded Handedness:No data recorded  Mood and Affect  Mood:No data recorded Affect:No data recorded  Thought Process  Thought Processes:No data recorded Descriptions of Associations:No data recorded Orientation:No data recorded Thought Content:No data recorded History of Schizophrenia/Schizoaffective disorder:No data recorded Duration of Psychotic Symptoms:No data recorded Hallucinations:No data recorded Ideas of Reference:No data recorded Suicidal Thoughts:No data recorded Homicidal Thoughts:No data recorded  Sensorium  Memory:No data recorded Judgment:No data recorded Insight:No data recorded  Executive Functions  Concentration:No data recorded Attention Span:No data recorded Recall:No data recorded Fund of  Knowledge:No data recorded Language:No data recorded  Psychomotor Activity  Psychomotor Activity:No data recorded  Assets  Assets:No data recorded  Sleep  Sleep:No data recorded  Physical Exam: Physical Exam ROS Blood pressure 139/85, pulse 71, temperature 99 F (37.2 C), temperature source Axillary, resp. rate 15, height 6' (1.829 m), weight 70.3 kg, SpO2 98 %. Body mass index is 21.02 kg/m.  Treatment Plan Summary: Medication management: Continue haldol 1 mg IV prn for agitation.Haldol 2 mg daily only if pt refuses/unable to take po Prolixin 2.5 mg. Reviewed with Dr. [place] Pink   Disposition: No evidence of imminent risk to self or others at present.   Patient does not meet criteria for psychiatric inpatient admission.  Sherlon Handing, NP 12/18/2021 1:52 PM

## 2021-12-18 NOTE — Progress Notes (Signed)
[facility] Progress Note  Patient: Todd Brown    VPX:[id number]  DOA: 12/15/2021    Date of Service: the patient was seen and examined on 12/18/2021  Brief hospital course: 61 year old male with past medical history of hypertension and end-stage renal disease on hemodialysis presented to the emergency room from his group home/skilled nursing facility on 7/17 with confusion.  (Patient's baseline is that he is unable to care for himself and has a legal guardian.)  Patient last received dialysis on Friday, 7/14.  From being in the emergency room on 7/17, unable to get dialysis yesterday.  Patient found to have markedly elevated blood pressure with a systolic in the 462V.  Patient with questionable cardiac ischemia as well.  CT scan of head and neurological exam unremarkable.  Patient was admitted to the hospitalist service and started on his oral antihypertensives, nicardipine drip and given IV labetalol and as needed IV hydralazine.  Nephrology consulted and plans to take patient for dialysis tomorrow.  Attempts to wean off Cardene have led to increasing high blood pressure, compounded by patient at first refusing to take oral medications.  Had become more physically agitated, wanting to be left alone.  Seen by psychiatry and started on scheduled and as needed Haldol.  Has become more compliant and better mentating and taking medications and blood pressure improving.  Also developed some periorbital swelling bilaterally felt to be secondary to subcutaneous emphysema and started on steroids, and periorbital swelling has since resolved.    Assessment and Plan: Assessment and Plan: * Acute metabolic encephalopathy Nursing left message for guardian.  Patient is unable to care for self.  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 Continue home Tegretol and Artane.  Appreciate psychiatry help.  Patient on scheduled and as needed Haldol which appears to have  made a difference in his compliance, although patient refusing to take oral medications.  Appreciate psychiatry help.  Started on scheduled and as needed IV Haldol.  Psychosis appears to be resolving.  Hypertensive emergency In part from needing dialysis yesterday.  On nicardipine drip has improved, however unable to wean, especially in light of patient is not take p.o. medications.  The patient is taking his p.o. medications, Cardene drip able to be weaned off and blood pressure staying stable.  ESRD on dialysis [facility]) Nephrology consulted, full dialysis session on 7/8 and again today to keep him on his Monday Wednesday Friday schedule.  Essential hypertension Resumed on his home p.o. amlodipine 10 mg daily, clonidine 0.3 mg p.o. 3 times daily, hydralazine 100 mg p.o. 3 times daily, isosorbide mononitrate 60 mg p.o. twice daily, losartan 100 mg daily.  Pressures improving.  Subcutaneous emphysema (HCC)-resolved as of 12/18/2021 On steroids.  Appreciate pulmonary assistance.  Looks to be resolved       Body mass index is 21.02 kg/m.        Consultants: Nephrology Critical care Psychiatry  Procedures: Hemodialysis 7/18, 7/19  Antimicrobials: None  Code Status: Full code   Subjective: Patient more calm, denies complaints  Objective: Elevated blood pressures, since improved Vitals:   12/18/21 1300 12/18/21 1400  BP: (!) 154/94 (!) 140/99  Pulse: 66 72  Resp: (!) 9 15  Temp:    SpO2: 100% 93%    Intake/Output Summary (Last 24 hours) at 12/18/2021 1613 Last data filed at 12/18/2021 1139 Gross per 24 hour  Intake 619.78 ml  Output 1000 ml  Net -380.22 ml    Filed  Weights   12/16/21 1949 12/17/21 1348 12/17/21 1701  Weight: 71.4 kg 70.9 kg 70.3 kg   Body mass index is 21.02 kg/m.  Exam:  General: Oriented x1-2, no acute distress HEENT: Normocephalic and atraumatic, mucous membranes slightly dry Cardiovascular: Regular rate and rhythm, S1-S2, 2 out of 6  systolic ejection murmur Respiratory: Clear to auscultation bilaterally Abdomen: Soft, nontender, nondistended, hypoactive bowel sounds Musculoskeletal: No clubbing or cyanosis, trace pitting edema Skin: No skin breaks, tears or lesions Psychiatry: Withdrawn, flattened affect Neurology: Difficult to assess secondary to compliance  Data Reviewed: No labs today  Disposition:  Status is: Inpatient Remains inpatient appropriate because: Stabilization of blood pressure, resolution of psychosis    Anticipated discharge date: 7/21 or 7/22  Family Communication: No family, message left for guardian DVT Prophylaxis: heparin injection 5,000 Units Start: 12/15/21 1645 Place TED hose Start: 12/15/21 1629    Author: Annita Brod ,MD 12/18/2021 4:13 PM  To reach On-call, see care teams to locate the attending and reach out via [email or web address]. Between 7PM-7AM, please contact night-coverage If you still have difficulty reaching the attending provider, please page the [facility] (Director on Call) for [facility] on amion for assistance.

## 2021-12-19 DIAGNOSIS — N186 End stage renal disease: Secondary | ICD-10-CM | POA: Diagnosis not present

## 2021-12-19 DIAGNOSIS — F99 Mental disorder, not otherwise specified: Secondary | ICD-10-CM

## 2021-12-19 DIAGNOSIS — G9341 Metabolic encephalopathy: Secondary | ICD-10-CM | POA: Diagnosis not present

## 2021-12-19 DIAGNOSIS — I161 Hypertensive emergency: Secondary | ICD-10-CM | POA: Diagnosis not present

## 2021-12-19 DIAGNOSIS — Z992 Dependence on renal dialysis: Secondary | ICD-10-CM | POA: Diagnosis not present

## 2021-12-19 MED ORDER — NITROGLYCERIN 2 % TD OINT
1.0000 [in_us] | TOPICAL_OINTMENT | Freq: Four times a day (QID) | TRANSDERMAL | Status: DC
  Administered 2021-12-19: 1 [in_us] via TOPICAL
  Filled 2021-12-19: qty 1

## 2021-12-19 MED ORDER — PREDNISONE 10 MG PO TABS: ORAL_TABLET | ORAL | 0 refills | Status: DC

## 2021-12-19 MED ORDER — HALOPERIDOL 5 MG PO TABS: 5.0000 mg | ORAL_TABLET | Freq: Two times a day (BID) | ORAL | 0 refills | Status: DC | PRN

## 2021-12-19 NOTE — Progress Notes (Signed)
Patient transported to hemodialysis via bed.

## 2021-12-19 NOTE — Progress Notes (Signed)
Pt HD end at 1340. 3.5 hr tx, 1028ml fluid removed. 84L BVP. LAVF positive for thrill and bruit. Sites held post HD 10 mins each. Pt responds to voice, no changes in mentation and report to primary RN

## 2021-12-19 NOTE — TOC Progression Note (Signed)
Transition of Care [facility]) - Progression Note    Patient Details  Name: Todd Brown MRN: [id number] Date of Birth: Nov 25, 1960  Transition of Care [facility]) CM/SW [place],  Phone Number: 12/19/2021, 4:05 PM  Clinical Narrative:     Patient was to discharge back to [facility], [name] aware and available to pick patient up. Per RN patient unable to be transferred to chair to get wheeled out to for discharge despite 3 people attempting assist, patient appears weak and deconditioned.   CSW relayed information to MD in which discharge is cancelled today, PT/OT to be consulted.     Expected Discharge Plan:  (TBD) Barriers to Discharge: Continued Medical Work up  Expected Discharge Plan and Services Expected Discharge Plan:  (TBD)       Living arrangements for the past 2 months: [place] ([name]) Expected Discharge Date: 12/19/21                                     Social Determinants of Health (SDOH) Interventions    Readmission Risk Interventions     No data to display

## 2021-12-19 NOTE — Progress Notes (Signed)
Post hd vitals 

## 2021-12-19 NOTE — Assessment & Plan Note (Signed)
Unspecified.  Patient is unable to care for himself.  It is unclear if he is ever had a formal diagnosis.  He is on Tegretol and Artane which he should continue.  As needed Haldol for acute behavior agitation as above.

## 2021-12-19 NOTE — NC FL2 (Signed)
[place] LEVEL OF CARE SCREENING TOOL     IDENTIFICATION  Patient Name: Todd Brown Birthdate: February 20, 1961 Sex: male Admission Date (Current Location): 12/15/2021  [facility] and [place] Number:  Engineering geologist and Address:  [facility], [address], [place] , [place] [address]      Provider Number: [id number]  Attending Physician Name and Address:  Annita Brod, MD  Relative Name and Phone Number:       Current Level of Care: Hospital Recommended Level of Care: Ava, Other (Comment) [name]) Prior Approval Number:    Date Approved/Denied:   PASRR Number:    Discharge Plan: Other (Comment) ([facility]en years alf)    Current Diagnoses: Patient Active Problem List   Diagnosis Date Noted   Mental health disorder 94/76/5465   Acute metabolic encephalopathy 03/54/6568   Essential hypertension 12/15/2021   Hypertensive emergency 12/15/2021   ESRD on dialysis ([place]) 12/15/2021    Orientation RESPIRATION BLADDER Height & Weight     Self, Situation  Normal Continent Weight: 154 lb 15.7 oz (70.3 kg) Height:  6' (182.9 cm)  BEHAVIORAL SYMPTOMS/MOOD NEUROLOGICAL BOWEL NUTRITION STATUS      Continent Diet (DIET DYS 3)  AMBULATORY STATUS COMMUNICATION OF NEEDS Skin   Limited Assist Verbally Normal                       Personal Care Assistance Level of Assistance  Bathing, Total care, Dressing, Feeding Bathing Assistance: Limited assistance Feeding assistance: Maximum assistance Dressing Assistance: Limited assistance Total Care Assistance: Limited assistance   Functional Limitations Info  Sight, Hearing, Speech Sight Info: Adequate Hearing Info: Adequate Speech Info: Adequate    SPECIAL CARE FACTORS FREQUENCY                       Contractures Contractures Info: Not present    Additional Factors Info  Code Status, Allergies Code Status Info: full Allergies Info: no known  allergies           Discharge Medications: acetaminophen 500 MG tablet Commonly known as: TYLENOL Take 500-1,000 mg by mouth every 6 (six) hours as needed for mild pain.    albuterol 108 (90 Base) MCG/ACT inhaler Commonly known as: VENTOLIN HFA Inhale 1-2 puffs into the lungs every 4 (four) hours as needed for wheezing or shortness of breath.    amLODipine 10 MG tablet Commonly known as: NORVASC Take 10 mg by mouth every morning.    b complex-vitamin c-folic acid 0.8 MG Tabs tablet Take 1 tablet by mouth at bedtime.    calcium acetate 667 MG capsule Commonly known as: PHOSLO Take 1,334 mg by mouth 3 (three) times daily with meals.    carbamazepine 200 MG tablet Commonly known as: TEGRETOL Take 1,000 mg by mouth every morning.    cholecalciferol 25 MCG (1000 UNIT) tablet Commonly known as: VITAMIN D3 Take 1,000 Units by mouth daily.    cloNIDine 0.3 MG tablet Commonly known as: CATAPRES Take 0.3 mg by mouth 3 (three) times daily.    ferrous sulfate 325 (65 FE) MG tablet Take 325 mg by mouth daily with breakfast.    fluPHENAZine 2.5 MG tablet Commonly known as: PROLIXIN Take 2.5 mg by mouth daily.    fluticasone-salmeterol 250-50 MCG/ACT Aepb Commonly known as: ADVAIR Inhale 1 puff into the lungs in the morning and at bedtime.    haloperidol 5 MG tablet Commonly known as: HALDOL Take  1 tablet (5 mg total) by mouth 2 (two) times daily as needed for agitation (For any behavior issues or refuses to take his other medications, give Haldol, up to 2 times a day).    hydrALAZINE 100 MG tablet Commonly known as: APRESOLINE Take 100 mg by mouth 3 (three) times daily.    isosorbide mononitrate 60 MG 24 hr tablet Commonly known as: IMDUR Take 60 mg by mouth 2 (two) times daily.    losartan 100 MG tablet Commonly known as: COZAAR Take 100 mg by mouth daily.    omeprazole 40 MG capsule Commonly known as: PRILOSEC Take 40 mg by mouth every morning.    polyethylene  glycol 17 g packet Commonly known as: MIRALAX / GLYCOLAX Take 17 g by mouth daily as needed for mild constipation.    predniSONE 10 MG tablet Commonly known as: DELTASONE Take 5 tablets (50 mg total) by mouth daily for 1 day, THEN 4 tablets (40 mg total) daily for 1 day, THEN 3 tablets (30 mg total) daily for 1 day, THEN 2 tablets (20 mg total) daily for 1 day, THEN 1 tablet (10 mg total) daily for 1 day. Start taking on: December 20, 2021    trihexyphenidyl 2 MG tablet Commonly known as: ARTANE Take 2 mg by mouth 2 (two) times daily with a meal.              Relevant Imaging Results:  Relevant Lab Results:   Additional Baldwin, LCSW

## 2021-12-19 NOTE — Progress Notes (Addendum)
[employer] - Patient remains severely hypertensive with systolics 662H and continues to refuse  oral meds including losartan and imdur.. Minimal improvement in blood pressure post IV hydralazine. Heart rate not conducive to use of IV beta blocker or calcium channel blocker for BP management.    Action Initially Nitro paste  1 " topically every 6  hours started, bur changed to norvasc 5mg  daily since now cooperative with taking oral med  Response Will need further eval for effectiveness

## 2021-12-19 NOTE — Discharge Summary (Addendum)
Physician Discharge Summary   Patient: Todd Brown MRN: [id number] DOB: 09-19-1960  Admit date:     12/15/2021  Discharge date: 12/19/21  Discharge Physician: Annita Brod   PCP: Jodi Marble, MD   Recommendations at discharge:   New medication: Haldol 5 mg p.o. twice daily as needed for behavioral agitation. Patient is to always get his blood pressure medications, even on dialysis days. New medication: Prednisone taper  Discharge Diagnoses: Principal Problem:   Acute metabolic encephalopathy Active Problems:   Hypertensive emergency   ESRD on dialysis [facility])   Mental health disorder   Essential hypertension  Resolved Problems:   Subcutaneous emphysema [facility])  Hospital Course: 60 year old male with past medical history of hypertension and end-stage renal disease on hemodialysis presented to the emergency room from his group home/skilled nursing facility on 7/17 with confusion.  (Patient's baseline is that he is unable to care for himself and has a legal guardian.)  Patient last received dialysis on Friday, 7/14.  From being in the emergency room on 7/17, unable to get dialysis yesterday.  Patient found to have markedly elevated blood pressure with a systolic in the 267T.  Patient with questionable cardiac ischemia as well.  CT scan of head and neurological exam unremarkable.  Patient was admitted to the hospitalist service and started on his oral antihypertensives, nicardipine drip and given IV labetalol and as needed IV hydralazine.  Nephrology consulted and plans to take patient for dialysis tomorrow.  Attempts to wean off Cardene have led to increasing high blood pressure, compounded by patient at first refusing to take oral medications.  Had become more physically agitated, wanting to be left alone.  Seen by psychiatry and started on scheduled and as needed Haldol.  Has become more compliant and better mentating and taking medications and blood pressure improving.  Also  developed some periorbital swelling bilaterally felt to be secondary to subcutaneous emphysema and started on steroids, and periorbital swelling has since resolved.    Assessment and Plan: * Acute metabolic encephalopathy Nursing left message for guardian.  Patient is unable to care for self.  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 Continue home Tegretol and Artane.  Appreciate psychiatry help.  Patient on scheduled and as needed Haldol which appears to have made a difference in his compliance.  Psychosis appears to be resolving.  I suspect some of this may be secondary to hypertensive encephalopathy although based on records, he likely has an underlying mental health disorder that has not been formally diagnosed.  Will discharge on as needed Haldol 5 mg p.o. twice daily as needed for behavioral agitation and refusal to take medicines.  Hypertensive emergency Patient was able to wean off of his nicardipine drip.  Suspect that what has happened is is that when he does not take his blood pressure medicines either by choice or if not given on dialysis days, he has such malignant hypertension that makes him acutely confused and agitated.  There may be an underlying undiagnosed mental health disorder.  The best plan for the patient will be to ensure that he gets his blood pressure medications even dialysis days.  He is to continue his Tegretol and Artane.   ESRD on dialysis [facility]) Nephrology consulted, full dialysis session on 7/8 and again today to keep him on his Monday Wednesday Friday schedule.  Mental health disorder Unspecified.  Patient is unable to care for himself.  It is unclear if he is  ever had a formal diagnosis.  He is on Tegretol and Artane which he should continue.  As needed Haldol for acute behavior agitation as above.  Essential hypertension Resumed on his home p.o. amlodipine 10 mg daily, clonidine 0.3 mg p.o. 3 times daily, hydralazine  100 mg p.o. 3 times daily, isosorbide mononitrate 60 mg p.o. twice daily, losartan 100 mg daily.  Pressures improving. Patient is to always get his blood pressure medications, even on dialysis days.  Subcutaneous emphysema (HCC)-resolved as of 12/18/2021 On steroids.  Appreciate pulmonary assistance.  Subcutaneous emphysema looks to be resolved.  Changed from Solu-Medrol to p.o. prednisone quick taper.         Consultants: Nephrology Critical care Psychiatry   Procedures: Hemodialysis 7/18, 7/19, 7/21  Disposition: Skilled nursing facility Diet recommendation:  Discharge Diet Orders (From admission, onward)     Start     Ordered   12/19/21 0000  DIET DYS 3       Question:  Fluid consistency:  Answer:  Thin   12/19/21 1349           Renal diet DISCHARGE MEDICATION: Allergies as of 12/19/2021   No Known Allergies      Medication List     STOP taking these medications    meloxicam 7.5 MG tablet Commonly known as: MOBIC       TAKE these medications    acetaminophen 500 MG tablet Commonly known as: TYLENOL Take 500-1,000 mg by mouth every 6 (six) hours as needed for mild pain.   albuterol 108 (90 Base) MCG/ACT inhaler Commonly known as: VENTOLIN HFA Inhale 1-2 puffs into the lungs every 4 (four) hours as needed for wheezing or shortness of breath.   amLODipine 10 MG tablet Commonly known as: NORVASC Take 10 mg by mouth every morning.   b complex-vitamin c-folic acid 0.8 MG Tabs tablet Take 1 tablet by mouth at bedtime.   calcium acetate 667 MG capsule Commonly known as: PHOSLO Take 1,334 mg by mouth 3 (three) times daily with meals.   carbamazepine 200 MG tablet Commonly known as: TEGRETOL Take 1,000 mg by mouth every morning.   cholecalciferol 25 MCG (1000 UNIT) tablet Commonly known as: VITAMIN D3 Take 1,000 Units by mouth daily.   cloNIDine 0.3 MG tablet Commonly known as: CATAPRES Take 0.3 mg by mouth 3 (three) times daily.   ferrous  sulfate 325 (65 FE) MG tablet Take 325 mg by mouth daily with breakfast.   fluPHENAZine 2.5 MG tablet Commonly known as: PROLIXIN Take 2.5 mg by mouth daily.   fluticasone-salmeterol 250-50 MCG/ACT Aepb Commonly known as: ADVAIR Inhale 1 puff into the lungs in the morning and at bedtime.   haloperidol 5 MG tablet Commonly known as: HALDOL Take 1 tablet (5 mg total) by mouth 2 (two) times daily as needed for agitation (For any behavior issues or refuses to take his other medications, give Haldol, up to 2 times a day).   hydrALAZINE 100 MG tablet Commonly known as: APRESOLINE Take 100 mg by mouth 3 (three) times daily.   isosorbide mononitrate 60 MG 24 hr tablet Commonly known as: IMDUR Take 60 mg by mouth 2 (two) times daily.   losartan 100 MG tablet Commonly known as: COZAAR Take 100 mg by mouth daily.   omeprazole 40 MG capsule Commonly known as: PRILOSEC Take 40 mg by mouth every morning.   polyethylene glycol 17 g packet Commonly known as: MIRALAX / GLYCOLAX Take 17 g by mouth daily as needed  for mild constipation.   predniSONE 10 MG tablet Commonly known as: DELTASONE Take 5 tablets (50 mg total) by mouth daily for 1 day, THEN 4 tablets (40 mg total) daily for 1 day, THEN 3 tablets (30 mg total) daily for 1 day, THEN 2 tablets (20 mg total) daily for 1 day, THEN 1 tablet (10 mg total) daily for 1 day. Start taking on: December 20, 2021   trihexyphenidyl 2 MG tablet Commonly known as: ARTANE Take 2 mg by mouth 2 (two) times daily with a meal.        Discharge Exam: Filed Weights   12/17/21 1701 12/19/21 1000 12/19/21 1041  Weight: 70.3 kg 70.3 kg 70.3 kg   General: Alert and oriented x1-2 Cardiovascular: Regular rate and rhythm, S1-S2 Lungs: Clear to auscultation bilaterally  Condition at discharge: improving  The results of significant diagnostics from this hospitalization (including imaging, microbiology, ancillary and laboratory) are listed below for  reference.   Imaging Studies: DG Chest Port 1 View  Result Date: 12/17/2021 CLINICAL DATA:  COPD with acute exacerbation. Altered mental status. EXAM: PORTABLE CHEST 1 VIEW COMPARISON:  AP chest 12/15/2021 trauma 10/07/2021 and 10/23/2019 FINDINGS: Right chest wall dual-lumen central venous catheter tip overlies the superior vena cava/right atrial junction. Cardiac silhouette is again mildly to moderately enlarged. Mediastinal contours within normal limits. Mild interstitial thickening is similar to prior. No pleural effusion or pneumothorax. No acute skeletal abnormality. IMPRESSION: No significant change from prior. Mild bilateral interstitial thickening compatible with mild interstitial pulmonary edema versus chronic interstitial scarring. Electronically Signed   By: Yvonne Kendall M.D.   On: 12/17/2021 13:06   CT HEAD WO CONTRAST (5MM)  Result Date: 12/15/2021 CLINICAL DATA:  Altered mental status EXAM: CT HEAD WITHOUT CONTRAST TECHNIQUE: Contiguous axial images were obtained from the base of the skull through the vertex without intravenous contrast. RADIATION DOSE REDUCTION: This exam was performed according to the departmental dose-optimization program which includes automated exposure control, adjustment of the mA and/or kV according to patient size and/or use of iterative reconstruction technique. COMPARISON:  None Available. FINDINGS: Brain: No evidence of acute cortical infarction, hemorrhage, cerebral edema, mass, mass effect, or midline shift. No hydrocephalus or extra-axial fluid collection. 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 Periventricular white matter changes, likely the sequela of chronic small vessel ischemic disease. Vascular: No hyperdense vessel. Skull: Normal. Negative for fracture or focal lesion. Sinuses/Orbits: No acute finding. Other: The mastoid air cells are well aerated. IMPRESSION: No acute intracranial process. Electronically Signed    By: Merilyn Baba M.D.   On: 12/15/2021 14:22   DG Chest Portable 1 View  Result Date: 12/15/2021 CLINICAL DATA:  Altered mental status. EXAM: PORTABLE CHEST 1 VIEW COMPARISON:  None Available. FINDINGS: Mild cardiomegaly is noted. Right internal jugular dialysis catheter is noted with distal tip in expected position of cavoatrial junction. Both lungs are clear. The visualized skeletal structures are unremarkable. IMPRESSION: No active disease. Electronically Signed   By: Marijo Conception M.D.   On: 12/15/2021 14:18    Microbiology: Results for orders placed or performed during the hospital encounter of 12/15/21  SARS Coronavirus 2 by RT PCR (hospital order, performed in [facility] hospital lab) *cepheid single result test* Anterior Nasal Swab     Status: None   Collection Time: 12/15/21  5:50 PM   Specimen: Anterior Nasal Swab  Result Value Ref Range Status   SARS Coronavirus 2 by RT PCR NEGATIVE NEGATIVE  Final    Comment: (NOTE) SARS-CoV-2 target nucleic acids are NOT DETECTED.  The SARS-CoV-2 RNA is generally detectable in upper and lower respiratory specimens during the acute phase of infection. The lowest concentration of SARS-CoV-2 viral copies this assay can detect is 250 copies / mL. A negative result does not preclude SARS-CoV-2 infection and should not be used as the sole basis for treatment or other patient management decisions.  A negative result may occur with improper specimen collection / handling, submission of specimen other than nasopharyngeal swab, presence of viral mutation(s) within the areas targeted by this assay, and inadequate number of viral copies (<250 copies / mL). A negative result must be combined with clinical observations, patient history, and epidemiological information.  Fact Sheet for Patients:   https://www.patel.info/  Fact Sheet for Healthcare Providers: https://hall.com/  This test is not yet approved  or  cleared by the [place] FDA and has been authorized for detection and/or diagnosis of SARS-CoV-2 by FDA under an Emergency Use Authorization (EUA).  This EUA will remain in effect (meaning this test can be used) for the duration of the COVID-19 declaration under Section 564(b)(1) of the Act, 21 U.S.C. section 360bbb-3(b)(1), unless the authorization is terminated or revoked sooner.  Performed at [facility], [address] [place], [place] [address]   MRSA Next Gen by PCR, Nasal     Status: None   Collection Time: 12/15/21  9:03 PM   Specimen: Nasal Mucosa; Nasal Swab  Result Value Ref Range Status   MRSA by PCR Next Gen NOT DETECTED NOT DETECTED Final    Comment: (NOTE) The GeneXpert MRSA Assay (FDA approved for NASAL specimens only), is one component of a comprehensive MRSA colonization surveillance program. It is not intended to diagnose MRSA infection nor to guide or monitor treatment for MRSA infections. Test performance is not FDA approved in patients less than 36 years old. Performed at [facility], [address] [place], [place] [address]     Labs: CBC: Recent Labs  Lab 12/15/21 1203 12/16/21 0525  WBC 3.7* 3.1*  HGB 11.9* 11.4*  HCT 36.1* 33.3*  MCV 102.0* 96.8  PLT 148* 414   Basic Metabolic Panel: Recent Labs  Lab 12/15/21 1203 12/16/21 0525  NA 137 136  K 4.9 4.9  CL 97* 98  CO2 25 27  GLUCOSE 86 74  BUN 51* 55*  CREATININE 6.91* 7.86*  CALCIUM 10.2 9.8  PHOS  --  2.9   Liver Function Tests: Recent Labs  Lab 12/15/21 1203  AST 28  ALT 20  ALKPHOS 61  BILITOT 0.7  PROT 6.8  ALBUMIN 3.6   CBG: Recent Labs  Lab 12/15/21 2053 12/17/21 1942 12/18/21 2116  GLUCAP 91 87 112*    Discharge time spent: less than 30 minutes.  Signed: Annita Brod, MD [facility] 12/19/2021

## 2021-12-19 NOTE — Progress Notes (Signed)
Pre HD RN assessment 

## 2021-12-19 NOTE — Progress Notes (Signed)
[facility]  ROUNDING NOTE   Subjective:   Todd Brown is a 61 year old male with past medical history including hypertension, anemia, schizophrenia and end-stage renal disease on hemodialysis.  Patient has presented to the ED with altered mental status.  Patient has been admitted for Altered mental status [R41.82] Hypertensive urgency [I16.0] Severe hypertension [I10] Altered mental status, unspecified altered mental status type [R41.82]  Patient is known to our practice and receives outpatient dialysis treatments at [facility] on a MWF schedule.  Update Patient seen and evaluated at the bedside in ICU Patient arousalable  Denies pain and discomfort Scheduled for dialysis later this morning  Objective:  Vital signs in last 24 hours:  Temp:  [97.8 F (36.6 C)-99.1 F (37.3 C)] 97.8 F (36.6 C) (07/21 1010) Pulse Rate:  [61-77] 77 (07/21 1200) Resp:  [7-16] 15 (07/21 1200) BP: (116-246)/(82-118) 235/97 (07/21 1200) SpO2:  [93 %-100 %] 97 % (07/21 1200) Weight:  [70.3 kg] 70.3 kg (07/21 1041)  Weight change:  Filed Weights   12/17/21 1701 12/19/21 1000 12/19/21 1041  Weight: 70.3 kg 70.3 kg 70.3 kg    Intake/Output: I/O last 3 completed shifts: In: 838.7 [P.O.:720; I.V.:118.7] Out: -    Intake/Output this shift:  No intake/output data recorded.  Physical Exam: General: ill-appearing  Head: Normocephalic, atraumatic. Moist oral mucosal membranes  Eyes: Anicteric  Lungs:  Clear to auscultation, normal effort, room air  Heart: Regular rate and rhythm  Abdomen:  Soft, nontender, non distended  Extremities:  No peripheral edema.  Neurologic: Nonfocal, moving all four extremities  Skin: No lesions  Access: Lt AVF    Basic Metabolic Panel: Recent Labs  Lab 12/15/21 1203 12/16/21 0525  NA 137 136  K 4.9 4.9  CL 97* 98  CO2 25 27  GLUCOSE 86 74  BUN 51* 55*  CREATININE 6.91* 7.86*  CALCIUM 10.2 9.8  PHOS  --  2.9     Liver  Function Tests: Recent Labs  Lab 12/15/21 1203  AST 28  ALT 20  ALKPHOS 61  BILITOT 0.7  PROT 6.8  ALBUMIN 3.6    No results for input(s): "LIPASE", "AMYLASE" in the last 168 hours. No results for input(s): "AMMONIA" in the last 168 hours.  CBC: Recent Labs  Lab 12/15/21 1203 12/16/21 0525  WBC 3.7* 3.1*  HGB 11.9* 11.4*  HCT 36.1* 33.3*  MCV 102.0* 96.8  PLT 148* 161     Cardiac Enzymes: No results for input(s): "CKTOTAL", "CKMB", "CKMBINDEX", "TROPONINI" in the last 168 hours.  BNP: Invalid input(s): "POCBNP"  CBG: Recent Labs  Lab 12/15/21 2053 12/17/21 1942 12/18/21 2116  GLUCAP 91 9 112*     Microbiology: Results for orders placed or performed during the hospital encounter of 12/15/21  SARS Coronavirus 2 by RT PCR (hospital order, performed in [facility] hospital lab) *cepheid single result test* Anterior Nasal Swab     Status: None   Collection Time: 12/15/21  5:50 PM   Specimen: Anterior Nasal Swab  Result Value Ref Range Status   SARS Coronavirus 2 by RT PCR NEGATIVE NEGATIVE Final    Comment: (NOTE) SARS-CoV-2 target nucleic acids are NOT DETECTED.  The SARS-CoV-2 RNA is generally detectable in upper and lower respiratory specimens during the acute phase of infection. The lowest concentration of SARS-CoV-2 viral copies this assay can detect is 250 copies / mL. A negative result does not preclude SARS-CoV-2 infection and should not be used as the sole basis for treatment or  other patient management decisions.  A negative result may occur with improper specimen collection / handling, submission of specimen other than nasopharyngeal swab, presence of viral mutation(s) within the areas targeted by this assay, and inadequate number of viral copies (<250 copies / mL). A negative result must be combined with clinical observations, patient history, and epidemiological information.  Fact Sheet for Patients:    https://www.patel.info/  Fact Sheet for Healthcare Providers: https://hall.com/  This test is not yet approved or  cleared by the [place] FDA and has been authorized for detection and/or diagnosis of SARS-CoV-2 by FDA under an Emergency Use Authorization (EUA).  This EUA will remain in effect (meaning this test can be used) for the duration of the COVID-19 declaration under Section 564(b)(1) of the Act, 21 U.S.C. section 360bbb-3(b)(1), unless the authorization is terminated or revoked sooner.  Performed at [facility] [place], [place] [address]   MRSA Next Gen by PCR, Nasal     Status: None   Collection Time: 12/15/21  9:03 PM   Specimen: Nasal Mucosa; Nasal Swab  Result Value Ref Range Status   MRSA by PCR Next Gen NOT DETECTED NOT DETECTED Final    Comment: (NOTE) The GeneXpert MRSA Assay (FDA approved for NASAL specimens only), is one component of a comprehensive MRSA colonization surveillance program. It is not intended to diagnose MRSA infection nor to guide or monitor treatment for MRSA infections. Test performance is not FDA approved in patients less than 6 years old. Performed at [facility], [address] [place], [place] [address]     Coagulation Studies: No results for input(s): "LABPROT", "INR" in the last 72 hours.  Urinalysis: No results for input(s): "COLORURINE", "LABSPEC", "PHURINE", "GLUCOSEU", "HGBUR", "BILIRUBINUR", "KETONESUR", "PROTEINUR", "UROBILINOGEN", "NITRITE", "LEUKOCYTESUR" in the last 72 hours.  Invalid input(s): "APPERANCEUR"    Imaging: DG Chest Port 1 View  Result Date: 12/17/2021 CLINICAL DATA:  COPD with acute exacerbation. Altered mental status. EXAM: PORTABLE CHEST 1 VIEW COMPARISON:  AP chest 12/15/2021 trauma 10/07/2021 and 10/23/2019 FINDINGS: Right chest wall dual-lumen central venous catheter tip overlies the superior vena cava/right atrial  junction. Cardiac silhouette is again mildly to moderately enlarged. Mediastinal contours within normal limits. Mild interstitial thickening is similar to prior. No pleural effusion or pneumothorax. No acute skeletal abnormality. IMPRESSION: No significant change from prior. Mild bilateral interstitial thickening compatible with mild interstitial pulmonary edema versus chronic interstitial scarring. Electronically Signed   By: Yvonne Kendall M.D.   On: 12/17/2021 13:06     Medications:    anticoagulant sodium citrate      amLODipine  10 mg Oral q morning   calcium acetate  1,334 mg Oral TID WC   carbamazepine  1,000 mg Oral q AM   Chlorhexidine Gluconate Cloth  6 each Topical Q0600   cloNIDine  0.3 mg Oral TID   diphenhydrAMINE  50 mg Intravenous Q6H   ferrous sulfate  325 mg Oral Q breakfast   fluPHENAZine  2.5 mg Oral q AM   haloperidol lactate  2 mg Intravenous Daily   heparin  5,000 Units Subcutaneous Q8H   hydrALAZINE  100 mg Oral TID   isosorbide mononitrate  60 mg Oral BID   losartan  100 mg Oral Daily   methylPREDNISolone (SOLU-MEDROL) injection  125 mg Intravenous Q12H   mometasone-formoterol  2 puff Inhalation BID   pantoprazole  40 mg Oral Daily   trihexyphenidyl  2 mg Oral BID WC   albuterol, alteplase,  anticoagulant sodium citrate, haloperidol lactate, heparin, hydrALAZINE, lidocaine (PF), lidocaine-prilocaine, ondansetron **OR** ondansetron (ZOFRAN) IV, mouth rinse, pentafluoroprop-tetrafluoroeth, polyethylene glycol, senna-docusate  Assessment/ Plan:  Mr. Lamaj Metoyer is a 61 y.o.  male with past medical history including hypertension, anemia, schizophrenia and end-stage renal disease on hemodialysis.  Patient has presented to the ED with altered mental status.  Patient has been admitted for Altered mental status [R41.82] Hypertensive urgency [I16.0] Severe hypertension [I10] Altered mental status, unspecified altered mental status type [R41.82]  [facility]  [place]/MWF/Lt AVF  End-stage renal disease on hemodialysis.  Will maintain outpatient schedule if possible.    Scheduled for dialysis later today, UF goal 1L as tolerated. Next treatment scheduled for Monday.  2. Anemia of chronic kidney disease Lab Results  Component Value Date   HGB 11.4 (L) 12/16/2021    Mircera prescribed outpatient. Will monitor anemia and consider ESA's when needed.   3. Secondary Hyperparathyroidism:  Lab Results  Component Value Date   CALCIUM 9.8 12/16/2021   PHOS 2.9 12/16/2021    Will obtain labs in am  4.  Hypertension with chronic kidney disease.  Home regimen includes amlodipine, clonidine, hydralazine, isosorbide, and losartan.    Weaned off nicardipine drip, receiving home medications. Blood pressure 127/93 prior to dialysis, 235/97 during dialysis   LOS: 4   7/21/202312:21 PM

## 2021-12-19 NOTE — Progress Notes (Signed)
Post HD RN assessment 

## 2021-12-19 NOTE — Progress Notes (Signed)
Addendum: Patient was supposed to be discharged back to his assisted living facility today after dialysis.  His baseline is that he is able to sit in a chair for dialysis although some assistance.  And trying to get him out of bed and placed in a wheelchair, took 3 people and still patient unable to sit and stay in chair.  Discussed with Education officer, museum.  We will plan to cancel discharge and have PT OT evaluation.

## 2021-12-20 DIAGNOSIS — R5381 Other malaise: Secondary | ICD-10-CM | POA: Diagnosis not present

## 2021-12-20 DIAGNOSIS — N186 End stage renal disease: Secondary | ICD-10-CM | POA: Diagnosis not present

## 2021-12-20 DIAGNOSIS — I161 Hypertensive emergency: Secondary | ICD-10-CM | POA: Diagnosis not present

## 2021-12-20 DIAGNOSIS — G9341 Metabolic encephalopathy: Secondary | ICD-10-CM | POA: Diagnosis not present

## 2021-12-20 LAB — RENAL FUNCTION PANEL
Albumin: 3.5 g/dL (ref 3.5–5.0)
Anion gap: 12 (ref 5–15)
BUN: 34 mg/dL — ABNORMAL HIGH (ref 8–23)
CO2: 26 mmol/L (ref 22–32)
Calcium: 9.5 mg/dL (ref 8.9–10.3)
Chloride: 94 mmol/L — ABNORMAL LOW (ref 98–111)
Creatinine, Ser: 4.52 mg/dL — ABNORMAL HIGH (ref 0.61–1.24)
GFR, Estimated: 14 mL/min — ABNORMAL LOW (ref >60)
Glucose, Bld: 88 mg/dL (ref 70–99)
Phosphorus: 3.4 mg/dL (ref 2.5–4.6)
Potassium: 4 mmol/L (ref 3.5–5.1)
Sodium: 132 mmol/L — ABNORMAL LOW (ref 135–145)

## 2021-12-20 LAB — CBC
HCT: 31.4 % — ABNORMAL LOW (ref 39.0–52.0)
Hemoglobin: 10.9 g/dL — ABNORMAL LOW (ref 13.0–17.0)
MCH: 32.8 pg (ref 26.0–34.0)
MCHC: 34.7 g/dL (ref 30.0–36.0)
MCV: 94.6 fL (ref 80.0–100.0)
Platelets: 171 10*3/uL (ref 150–400)
RBC: 3.32 MIL/uL — ABNORMAL LOW (ref 4.22–5.81)
RDW: 13.6 % (ref 11.5–15.5)
WBC: 3.2 10*3/uL — ABNORMAL LOW (ref 4.0–10.5)
nRBC: 0 % (ref 0.0–0.2)

## 2021-12-20 MED ORDER — HYDRALAZINE HCL 20 MG/ML IJ SOLN: 20.0000 mg | Freq: Four times a day (QID) | INTRAMUSCULAR | Status: DC | PRN

## 2021-12-20 MED ORDER — HYDRALAZINE HCL 20 MG/ML IJ SOLN
10.0000 mg | Freq: Once | INTRAMUSCULAR | Status: AC
  Administered 2021-12-20: 10 mg via INTRAVENOUS
  Filled 2021-12-20: qty 1

## 2021-12-20 MED ORDER — PREDNISONE 10 MG PO TABS
50.0000 mg | ORAL_TABLET | Freq: Every day | ORAL | Status: DC
  Administered 2021-12-21: 50 mg via ORAL
  Filled 2021-12-20: qty 5

## 2021-12-20 NOTE — Evaluation (Signed)
Physical Therapy Evaluation Patient Details Name: Renato Spellman MRN: [id number] DOB: 04/18/1961 Today's Date: 12/20/2021  History of Present Illness  61 year old male with past medical history of hypertension and end-stage renal disease on hemodialysis presented to the emergency room from his group home/skilled nursing facility on 7/17 with confusion.  (Patient's baseline is that he is unable to care for himself and has a legal guardian.)  Patient last received dialysis on Friday, 7/14.  From being in the emergency room on 7/17, unable to get dialysis yesterday.  Patient found to have markedly elevated blood pressure with a systolic in the 409W.  Patient with questionable cardiac ischemia as well  Clinical Impression  PT/OT co-eval performed this date. Pt is a pleasant 61 year old male who was admitted for acute metabolic encephalopathy. Pt is very limited by lethargy this date with poor participation. Pt performs bed mobility with total assist and is unable to further perform OOB. Pt demonstrates deficits with cognition/mobility. Would benefit from skilled PT to address above deficits and promote optimal return to PLOF; recommend transition to STR upon discharge from acute hospitalization. Based on previous hospitalization, once medical status improves, anticipate pt will be able to perform SPT (baseline mobility) with assistance and hopefully be on track to dc back to ALF. Would encourage RN staff to continue to sit pt on EOB for med pass and meals to improve tolerance for exertional activity.      Recommendations for follow up therapy are one component of a multi-disciplinary discharge planning process, led by the attending physician.  Recommendations may be updated based on patient status, additional functional criteria and insurance authorization.  Follow Up Recommendations Skilled nursing-short term rehab (<3 hours/day) (anticipate return to ALF once medical status improves) Can patient  physically be transported by private vehicle: No    Assistance Recommended at Discharge Frequent or constant Supervision/Assistance  Patient can return home with the following       Equipment Recommendations  (TBD)  Recommendations for Other Services       Functional Status Assessment Patient has had a recent decline in their functional status and/or demonstrates limited ability to make significant improvements in function in a reasonable and predictable amount of time     Precautions / Restrictions Precautions Precautions: Fall Precaution Comments: watch BP Restrictions Weight Bearing Restrictions: No      Mobility  Bed Mobility Overal bed mobility: Needs Assistance Bed Mobility: Supine to Sit, Sit to Supine     Supine to sit: Total assist, Max assist, +2 for physical assistance Sit to supine: Max assist, Total assist, +2 for physical assistance, HOB elevated   General bed mobility comments: total assist with minimal participation from patient. Heavy post L lateral lean during seated EOB. Unable to maintain upright or stay awake enough for further participation. +2 assist    Transfers                   General transfer comment: unable    Ambulation/Gait                  Stairs            Wheelchair Mobility    Modified Rankin (Stroke Patients Only)       Balance Overall balance assessment: Needs assistance Sitting-balance support: Feet supported Sitting balance-[name]: Zero  Pertinent Vitals/Pain Pain Assessment Pain Assessment: No/denies pain    Home Living Family/patient expects to be discharged to:: Assisted living                   Additional Comments: [facility] lives [facility], per chart and previous admissionpt SPT to bed/mwc with CGA; Per chart review assist from ALF staff for ADL    Prior Function Prior Level of Function : Needs assist              Mobility Comments: SPT to WC at baseline       Hand Dominance        Extremity/Trunk Assessment   Upper Extremity Assessment Upper Extremity Assessment: Difficult to assess due to impaired cognition    Lower Extremity Assessment Lower Extremity Assessment: Difficult to assess due to impaired cognition       Communication   Communication: HOH  Cognition Arousal/Alertness: Lethargic   Overall Cognitive Status: No family/caregiver present to determine baseline cognitive functioning                                 General Comments: lethargic, ?BP with readings attempted in supine/seated position, unable to get accurate reading. RN suggested to take it on R LE next session        General Comments      Exercises     Assessment/Plan    PT Assessment Patient needs continued PT services  PT Problem List Decreased strength;Decreased balance;Decreased mobility       PT Treatment Interventions Gait training;DME instruction;Therapeutic activities;Therapeutic exercise    PT Goals (Current goals can be found in the Care Plan section)  Acute Rehab PT Goals Patient Stated Goal: unable to state PT Goal Formulation: Patient unable to participate in goal setting Time For Goal Achievement: 01/03/22 Potential to Achieve Goals: Fair    Frequency Min 2X/week     Co-evaluation PT/OT/SLP Co-Evaluation/Treatment: Yes Reason for Co-Treatment: Complexity of the patient's impairments (multi-system involvement);Necessary to address cognition/behavior during functional activity;For patient/therapist safety;To address functional/ADL transfers PT goals addressed during session: Mobility/safety with mobility;Balance OT goals addressed during session: ADL's and self-care       AM-PAC PT "6 Clicks" Mobility  Outcome Measure Help needed turning from your back to your side while in a flat bed without using bedrails?: Total Help needed moving from lying on your back to  sitting on the side of a flat bed without using bedrails?: Total Help needed moving to and from a bed to a chair (including a wheelchair)?: Total Help needed standing up from a chair using your arms (e.g., wheelchair or bedside chair)?: Total Help needed to walk in hospital room?: Total Help needed climbing 3-5 steps with a railing? : Total 6 Click Score: 6    End of Session   Activity Tolerance: Patient limited by lethargy Patient left: in bed;with bed alarm set Nurse Communication: Mobility status PT Visit Diagnosis: Muscle weakness (generalized) (M62.81);Difficulty in walking, not elsewhere classified (R26.2)    Time: 0630-1601 PT Time Calculation (min) (ACUTE ONLY): 13 min   Charges:   PT Evaluation $PT Eval Moderate Complexity: 1 [address], PT, DPT, GCS [phone]   Tramar Brueckner 12/20/2021, 4:06 PM

## 2021-12-20 NOTE — Progress Notes (Signed)
Report called to Magdalen Spatz, receiving nurse for room 124. Pt transported to room 124 via bed on telemetry. Pt BP rechecked and found to be within normal range. No need for prn hydralazine at this time.

## 2021-12-20 NOTE — Assessment & Plan Note (Signed)
PT/OT eval ?

## 2021-12-20 NOTE — Evaluation (Signed)
Occupational Therapy Evaluation Patient Details Name: Todd Brown MRN: [id number] DOB: 1960/07/03 Today's Date: 12/20/2021   History of Present Illness 61 year old male with past medical history of hypertension and end-stage renal disease on hemodialysis presented to the emergency room from his group home/skilled nursing facility on 7/17 with confusion.  (Patient's baseline is that he is unable to care for himself and has a legal guardian.)  Patient last received dialysis on Friday, 7/14.  From being in the emergency room on 7/17, unable to get dialysis yesterday.  Patient found to have markedly elevated blood pressure with a systolic in the 619J.  Patient with questionable cardiac ischemia as well   Clinical Impression   Chart reviewed, RN cleared pt for participation in OT evaluation. Pt wakes to tactile and auditory input, however with poor alertness throughout evaluation. Per chart review pt lives at [facility], performs SPT to [place], has assist for all ADL. Pt is a&Ox0- opens eyes to name and grumbling. Pt requires MAX-TOTAL A for supine<>sit, poor static sitting on EOB with L lateal lean requiring MAX A for upright sitting. TOTAL A required for LB dressing. At this time, recommend discharge to STR to address functional deficits. OT will continue to follow acutely.      Recommendations for follow up therapy are one component of a multi-disciplinary discharge planning process, led by the attending physician.  Recommendations may be updated based on patient status, additional functional criteria and insurance authorization.   Follow Up Recommendations  Skilled nursing-short term rehab (<3 hours/day)    Assistance Recommended at Discharge Frequent or constant Supervision/Assistance  Patient can return home with the following Two people to help with walking and/or transfers;Two people to help with bathing/dressing/bathroom    Functional Status Assessment  Patient has had a recent  decline in their functional status and demonstrates the ability to make significant improvements in function in a reasonable and predictable amount of time.  Equipment Recommendations  Other (comment) (per next venue of care)    Recommendations for Other Services       Precautions / Restrictions Precautions Precautions: Fall Precaution Comments: watch BP Restrictions Weight Bearing Restrictions: No      Mobility Bed Mobility Overal bed mobility: Needs Assistance Bed Mobility: Supine to Sit, Sit to Supine     Supine to sit: Total assist, Max assist, +2 for physical assistance Sit to supine: Max assist, Total assist, +2 for physical assistance, HOB elevated        Transfers Overall transfer level: Needs assistance                        Balance Overall balance assessment: Needs assistance Sitting-balance support: Feet supported Sitting balance-[name]: Zero   Postural control: Left lateral lean                                 ADL either performed or assessed with clinical judgement   ADL Overall ADL's : Needs assistance/impaired                                       General ADL Comments: MAX-TOTAL A LB dressing, grooming tasks on this date     Vision         Perception     Praxis      Pertinent Vitals/Pain  Pain Assessment Pain Assessment: Faces Faces Pain Scale: No hurt     Hand Dominance     Extremity/Trunk Assessment Upper Extremity Assessment Upper Extremity Assessment: Generalized weakness;Difficult to assess due to impaired cognition   Lower Extremity Assessment Lower Extremity Assessment: Difficult to assess due to impaired cognition       Communication     Cognition Arousal/Alertness: Lethargic   Overall Cognitive Status: No family/caregiver present to determine baseline cognitive functioning Area of Impairment: Orientation, Attention, Following commands, Safety/judgement, Awareness, Problem  solving                 Orientation Level: Disoriented to, Person, Place, Time, Situation Current Attention Level: Focused   Following Commands: Follows one step commands inconsistently Safety/Judgement: Decreased awareness of safety, Decreased awareness of deficits Awareness: Intellectual Problem Solving: Slow processing, Decreased initiation, Difficulty sequencing, Requires verbal cues, Requires tactile cues       General Comments       Exercises     Shoulder Instructions      Home Living Family/patient expects to be discharged to:: Assisted living                                 Additional Comments: pt lives at [facility], per chart and previous admissionpt SPT to bed/mwc with CGA; Per chart review assist from ALF staff for ADL      Prior Functioning/Environment Prior Level of Function : Needs assist                        OT Problem List: Decreased strength;Decreased activity tolerance;Impaired balance (sitting and/or standing);Decreased safety awareness      OT Treatment/Interventions: Self-care/ADL training;Patient/family education;Therapeutic exercise;Energy conservation;DME and/or AE instruction;Therapeutic activities    OT Goals(Current goals can be found in the care plan section) Acute Rehab OT Goals OT Goal Formulation: Patient unable to participate in goal setting ADL Goals Pt Will Perform Grooming: sitting;with set-up Pt Will Transfer to Toilet: with supervision;stand pivot transfer Pt Will Perform Toileting - Clothing Manipulation and hygiene: with min assist;sit to/from stand  OT Frequency: Min 2X/week    Co-evaluation              AM-PAC OT "6 Clicks" Daily Activity     Outcome Measure   Help from another person taking care of personal grooming?: Total Help from another person toileting, which includes using toliet, bedpan, or urinal?: Total Help from another person bathing (including washing, rinsing,  drying)?: Total Help from another person to put on and taking off regular upper body clothing?: Total Help from another person to put on and taking off regular lower body clothing?: Total 6 Click Score: 5   End of Session Nurse Communication: Mobility status (lethargy)  Activity Tolerance: Patient limited by lethargy Patient left: in bed;with call bell/phone within reach;with bed alarm set  OT Visit Diagnosis: Unsteadiness on feet (R26.81);Muscle weakness (generalized) (M62.81)                Time: 5093-2671 OT Time Calculation (min): 11 min Charges:  OT General Charges $OT Visit: 1 Visit OT Evaluation $OT Eval Moderate Complexity: 1 Mod  Shanon Payor, OTD OTR/L  12/20/21, 3:39 PM

## 2021-12-20 NOTE — Progress Notes (Signed)
[facility] Progress Note  Patient: Todd Brown    EHO:[id number]  DOA: 12/15/2021    Date of Service: the patient was seen and examined on 12/20/2021  Brief hospital course: 61 year old male with past medical history of hypertension and end-stage renal disease on hemodialysis presented to the emergency room from his group home/skilled nursing facility on 7/17 with confusion.  (Patient's baseline is that he is unable to care for himself and has a legal guardian.)  Patient last received dialysis on Friday, 7/14.  From being in the emergency room on 7/17, unable to get dialysis yesterday.  Patient found to have markedly elevated blood pressure with a systolic in the 370W.  Patient with questionable cardiac ischemia as well.  CT scan of head and neurological exam unremarkable.  Patient was admitted to the hospitalist service and started on his oral antihypertensives, nicardipine drip and given IV labetalol and as needed IV hydralazine.  Nephrology consulted and plans to take patient for dialysis tomorrow.  Attempts to wean off Cardene have led to increasing high blood pressure, compounded by patient at first refusing to take oral medications.  Had become more physically agitated, wanting to be left alone.  Seen by psychiatry and started on scheduled and as needed Haldol.  Has become more compliant and better mentating and taking medications and blood pressure improving.  Also developed some periorbital swelling bilaterally felt to be secondary to subcutaneous emphysema and started on steroids, and periorbital swelling has since resolved.  Patient was supposed to be discharged back to his assisted living facility on 7/21 after dialysis.  His baseline is that he is able to sit in a chair for dialysis although some assistance.  And trying to get him out of bed and placed in a wheelchair, took 3 people and still patient unable to sit and stay in chair.  Discharge canceled and plan is for patient to have  PT/OT evaluation and possible skilled nursing prior to returning to assisted living.  Assessment and Plan: Assessment and Plan: * Acute metabolic encephalopathy Nursing left message for guardian.  Patient is unable to care for self.  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 Continue home Tegretol and Artane.  Appreciate psychiatry help.  Patient on scheduled and as needed Haldol which appears to have made a difference in his compliance.  Psychosis appears to be resolving.  I suspect some of this may be secondary to hypertensive encephalopathy although based on records, he likely has an underlying mental health disorder that has not been formally diagnosed.  Will discharge on as needed Haldol 5 mg p.o. twice daily as needed for behavioral agitation and refusal to take medicines.  Hypertensive emergency Patient was able to wean off of his nicardipine drip.  Suspect that what has happened is is that when he does not take his blood pressure medicines either by choice or if not given on dialysis days, he has such malignant hypertension that makes him acutely confused and agitated.  There may be an underlying undiagnosed mental health disorder.  The best plan for the patient will be to ensure that he gets his blood pressure medications even dialysis days.  He is to continue his Tegretol and Artane.   ESRD on dialysis [facility]) Nephrology consulted, full dialysis session following admission and again today to keep him on his Monday Wednesday Friday schedule.  Next session scheduled for 7/24.  Mental health disorder Unspecified.  Patient is unable to care for  himself.  It is unclear if he is ever had a formal diagnosis.  He is on Tegretol and Artane which he should continue.  As needed Haldol for acute behavior agitation as above.  Essential hypertension Resumed on his home p.o. amlodipine 10 mg daily, clonidine 0.3 mg p.o. 3 times daily, hydralazine 100 mg p.o. 3  times daily, isosorbide mononitrate 60 mg p.o. twice daily, losartan 100 mg daily.  Pressures improving. Patient is to always get his blood pressure medications, even on dialysis days.  Subcutaneous emphysema (HCC)-resolved as of 12/18/2021 On steroids.  Appreciate pulmonary assistance.  Subcutaneous emphysema looks to be resolved.  Changed from Solu-Medrol to p.o. prednisone quick taper.  Physical deconditioning PT OT eval       Body mass index is 21.02 kg/m.        Consultants: Nephrology Critical care Psychiatry  Procedures: Hemodialysis 7/18, 7/19, 7/21  Antimicrobials: None  Code Status: Full code   Subjective: More calm this morning.  No complaints  Objective: Elevated blood pressures, since improved Vitals:   12/20/21 1200 12/20/21 1204  BP: 106/82 109/78  Pulse:    Resp: (!) 9 11  Temp:    SpO2:      Intake/Output Summary (Last 24 hours) at 12/20/2021 1211 Last data filed at 12/20/2021 1015 Gross per 24 hour  Intake 800 ml  Output 1000 ml  Net -200 ml   Filed Weights   12/17/21 1701 12/19/21 1000 12/19/21 1041  Weight: 70.3 kg 70.3 kg 70.3 kg   Body mass index is 21.02 kg/m.  Exam:  General: Oriented x1-2, no acute distress HEENT: Normocephalic and atraumatic, mucous membranes slightly dry Cardiovascular: Regular rate and rhythm, S1-S2, 2 out of 6 systolic ejection murmur Respiratory: Clear to auscultation bilaterally Abdomen: Soft, nontender, nondistended, hypoactive bowel sounds Musculoskeletal: No clubbing or cyanosis, trace pitting edema Skin: No skin breaks, tears or lesions Psychiatry: Withdrawn, flattened affect Neurology: Difficult to assess secondary to compliance  Data Reviewed: No labs today  Disposition:  Status is: Inpatient Remains inpatient appropriate because: Evaluation PT/OT, deconditioning    Anticipated discharge date: 7/24  Family Communication: No family, message left for guardian DVT Prophylaxis: heparin  injection 5,000 Units Start: 12/15/21 1645 Place TED hose Start: 12/15/21 1629    Author: Annita Brod ,MD 12/20/2021 12:11 PM  To reach On-call, see care teams to locate the attending and reach out via [email or web address]. Between 7PM-7AM, please contact night-coverage If you still have difficulty reaching the attending provider, please page the [facility] (Director on Call) for [facility] on amion for assistance.

## 2021-12-20 NOTE — Progress Notes (Signed)
[facility]  ROUNDING NOTE   Subjective:   Todd Brown is a 61 year old male with past medical history including hypertension, anemia, schizophrenia and end-stage renal disease on hemodialysis.  Patient has presented to the ED with altered mental status.  Patient has been admitted for Altered mental status [R41.82] Hypertensive urgency [I16.0] Severe hypertension [I10] Altered mental status, unspecified altered mental status type [R41.82]  Patient is known to our practice and receives outpatient dialysis treatments at [facility] on a MWF schedule.  Update Seen and evaluated in ICU Resting comfortably Dialysis yesterday, tolerated well Blood pressure remains elelvated  Objective:  Vital signs in last 24 hours:  Temp:  [97.6 F (36.4 C)-99.3 F (37.4 C)] 97.8 F (36.6 C) (07/22 0200) Pulse Rate:  [37-91] 64 (07/22 0600) Resp:  [9-22] 13 (07/22 0600) BP: (124-246)/(90-141) 206/114 (07/22 0600) SpO2:  [92 %-99 %] 97 % (07/22 0600) Weight:  [70.3 kg] 70.3 kg (07/21 1041)  Weight change:  Filed Weights   12/17/21 1701 12/19/21 1000 12/19/21 1041  Weight: 70.3 kg 70.3 kg 70.3 kg    Intake/Output: I/O last 3 completed shifts: In: 590 [P.O.:590] Out: 1000 [Other:1000]   Intake/Output this shift:  No intake/output data recorded.  Physical Exam: General: ill-appearing  Head: Normocephalic, atraumatic. Moist oral mucosal membranes  Eyes: Anicteric  Lungs:  Clear to auscultation, normal effort, room air  Heart: Regular rate and rhythm  Abdomen:  Soft, nontender, non distended  Extremities:  No peripheral edema.  Neurologic: Nonfocal, moving all four extremities  Skin: No lesions  Access: Lt AVF    Basic Metabolic Panel: Recent Labs  Lab 12/15/21 1203 12/16/21 0525  NA 137 136  K 4.9 4.9  CL 97* 98  CO2 25 27  GLUCOSE 86 74  BUN 51* 55*  CREATININE 6.91* 7.86*  CALCIUM 10.2 9.8  PHOS  --  2.9     Liver Function Tests: Recent Labs   Lab 12/15/21 1203  AST 28  ALT 20  ALKPHOS 61  BILITOT 0.7  PROT 6.8  ALBUMIN 3.6    No results for input(s): "LIPASE", "AMYLASE" in the last 168 hours. No results for input(s): "AMMONIA" in the last 168 hours.  CBC: Recent Labs  Lab 12/15/21 1203 12/16/21 0525  WBC 3.7* 3.1*  HGB 11.9* 11.4*  HCT 36.1* 33.3*  MCV 102.0* 96.8  PLT 148* 161     Cardiac Enzymes: No results for input(s): "CKTOTAL", "CKMB", "CKMBINDEX", "TROPONINI" in the last 168 hours.  BNP: Invalid input(s): "POCBNP"  CBG: Recent Labs  Lab 12/15/21 2053 12/17/21 1942 12/18/21 2116  GLUCAP 91 21 112*     Microbiology: Results for orders placed or performed during the hospital encounter of 12/15/21  SARS Coronavirus 2 by RT PCR (hospital order, performed in [facility] hospital lab) *cepheid single result test* Anterior Nasal Swab     Status: None   Collection Time: 12/15/21  5:50 PM   Specimen: Anterior Nasal Swab  Result Value Ref Range Status   SARS Coronavirus 2 by RT PCR NEGATIVE NEGATIVE Final    Comment: (NOTE) SARS-CoV-2 target nucleic acids are NOT DETECTED.  The SARS-CoV-2 RNA is generally detectable in upper and lower respiratory specimens during the acute phase of infection. The lowest concentration of SARS-CoV-2 viral copies this assay can detect is 250 copies / mL. A negative result does not preclude SARS-CoV-2 infection and should not be used as the sole basis for treatment or other patient management decisions.  A negative result  may occur with improper specimen collection / handling, submission of specimen other than nasopharyngeal swab, presence of viral mutation(s) within the areas targeted by this assay, and inadequate number of viral copies (<250 copies / mL). A negative result must be combined with clinical observations, patient history, and epidemiological information.  Fact Sheet for Patients:   https://www.patel.info/  Fact Sheet for  Healthcare Providers: https://hall.com/  This test is not yet approved or  cleared by the [place] FDA and has been authorized for detection and/or diagnosis of SARS-CoV-2 by FDA under an Emergency Use Authorization (EUA).  This EUA will remain in effect (meaning this test can be used) for the duration of the COVID-19 declaration under Section 564(b)(1) of the Act, 21 U.S.C. section 360bbb-3(b)(1), unless the authorization is terminated or revoked sooner.  Performed at [facility], [address] [place], [place] [address]   MRSA Next Gen by PCR, Nasal     Status: None   Collection Time: 12/15/21  9:03 PM   Specimen: Nasal Mucosa; Nasal Swab  Result Value Ref Range Status   MRSA by PCR Next Gen NOT DETECTED NOT DETECTED Final    Comment: (NOTE) The GeneXpert MRSA Assay (FDA approved for NASAL specimens only), is one component of a comprehensive MRSA colonization surveillance program. It is not intended to diagnose MRSA infection nor to guide or monitor treatment for MRSA infections. Test performance is not FDA approved in patients less than 23 years old. Performed at [facility], [address] [place], [place] [address]     Coagulation Studies: No results for input(s): "LABPROT", "INR" in the last 72 hours.  Urinalysis: No results for input(s): "COLORURINE", "LABSPEC", "PHURINE", "GLUCOSEU", "HGBUR", "BILIRUBINUR", "KETONESUR", "PROTEINUR", "UROBILINOGEN", "NITRITE", "LEUKOCYTESUR" in the last 72 hours.  Invalid input(s): "APPERANCEUR"    Imaging: No results found.   Medications:    anticoagulant sodium citrate      amLODipine  10 mg Oral q morning   calcium acetate  1,334 mg Oral TID WC   carbamazepine  1,000 mg Oral q AM   Chlorhexidine Gluconate Cloth  6 each Topical Q0600   cloNIDine  0.3 mg Oral TID   diphenhydrAMINE  50 mg Intravenous Q6H   ferrous sulfate  325 mg Oral Q breakfast   fluPHENAZine  2.5 mg  Oral q AM   haloperidol lactate  2 mg Intravenous Daily   heparin  5,000 Units Subcutaneous Q8H   hydrALAZINE  100 mg Oral TID   isosorbide mononitrate  60 mg Oral BID   losartan  100 mg Oral Daily   methylPREDNISolone (SOLU-MEDROL) injection  125 mg Intravenous Q12H   mometasone-formoterol  2 puff Inhalation BID   pantoprazole  40 mg Oral Daily   trihexyphenidyl  2 mg Oral BID WC   albuterol, alteplase, anticoagulant sodium citrate, haloperidol lactate, heparin, hydrALAZINE, lidocaine (PF), lidocaine-prilocaine, ondansetron **OR** ondansetron (ZOFRAN) IV, mouth rinse, pentafluoroprop-tetrafluoroeth, polyethylene glycol, senna-docusate  Assessment/ Plan:  Mr. Jahad Old is a 61 y.o.  male with past medical history including hypertension, anemia, schizophrenia and end-stage renal disease on hemodialysis.  Patient has presented to the ED with altered mental status.  Patient has been admitted for Altered mental status [R41.82] Hypertensive urgency [I16.0] Severe hypertension [I10] Altered mental status, unspecified altered mental status type [R41.82]  [facility] [place]/MWF/Lt AVF  End-stage renal disease on hemodialysis.  Will maintain outpatient schedule if possible.    Dialysis received yesterday, UF 1L achieved. Next treatment scheduled for Monday.  2. Anemia  of chronic kidney disease Lab Results  Component Value Date   HGB 11.4 (L) 12/16/2021    Mircera prescribed outpatient. Awaiting updated labs  3. Secondary Hyperparathyroidism:  Lab Results  Component Value Date   CALCIUM 9.8 12/16/2021   PHOS 2.9 12/16/2021   Awaiting updated labs  4.  Hypertension with chronic kidney disease.  Home regimen includes amlodipine, clonidine, hydralazine, isosorbide, and losartan.    Blood pressure remains elevated this morning after dialysis yesterday. Primary team to determine if transition backt o nicardipine drip is needed.    LOS: 5   7/22/20237:19 AM

## 2021-12-21 DIAGNOSIS — G9341 Metabolic encephalopathy: Secondary | ICD-10-CM | POA: Diagnosis not present

## 2021-12-21 DIAGNOSIS — R5381 Other malaise: Secondary | ICD-10-CM | POA: Diagnosis not present

## 2021-12-21 DIAGNOSIS — N186 End stage renal disease: Secondary | ICD-10-CM | POA: Diagnosis not present

## 2021-12-21 DIAGNOSIS — I161 Hypertensive emergency: Secondary | ICD-10-CM | POA: Diagnosis not present

## 2021-12-21 MED ORDER — HYDRALAZINE HCL 50 MG PO TABS
100.0000 mg | ORAL_TABLET | Freq: Three times a day (TID) | ORAL | Status: DC
  Administered 2021-12-21 – 2022-01-01 (×30): 100 mg via ORAL
  Filled 2021-12-21 (×6): qty 2
  Filled 2021-12-21 (×2): qty 4
  Filled 2021-12-21 (×9): qty 2
  Filled 2021-12-21: qty 4
  Filled 2021-12-21 (×11): qty 2
  Filled 2021-12-21: qty 4
  Filled 2021-12-21: qty 2

## 2021-12-21 MED ORDER — DOXAZOSIN MESYLATE 2 MG PO TABS
2.0000 mg | ORAL_TABLET | Freq: Every day | ORAL | Status: DC
Start: 2021-12-21 — End: 2021-12-24
  Administered 2021-12-21 – 2021-12-23 (×3): 2 mg via ORAL
  Filled 2021-12-21 (×4): qty 1

## 2021-12-21 MED ORDER — HYDRALAZINE HCL 20 MG/ML IJ SOLN
20.0000 mg | Freq: Four times a day (QID) | INTRAMUSCULAR | Status: DC | PRN
  Administered 2021-12-21 – 2022-02-13 (×15): 20 mg via INTRAVENOUS
  Filled 2021-12-21 (×15): qty 1

## 2021-12-21 MED ORDER — IRBESARTAN 150 MG PO TABS
300.0000 mg | ORAL_TABLET | Freq: Every day | ORAL | Status: DC
Start: 2021-12-22 — End: 2022-02-22
  Administered 2021-12-22 – 2022-02-22 (×57): 300 mg via ORAL
  Filled 2021-12-21 (×57): qty 2

## 2021-12-21 NOTE — Progress Notes (Signed)
   12/21/21 0832  Assess: MEWS Score  Temp 98.9 F (37.2 C)  BP (!) 249/104  MAP (mmHg) 138  Pulse Rate 73  Resp 20  SpO2 93 %  O2 Device Room Air  Assess: MEWS Score  MEWS Temp 0  MEWS Systolic 2  MEWS Pulse 0  MEWS RR 0  MEWS LOC 0  MEWS Score 2  MEWS Score Color Yellow  Assess: if the MEWS score is Yellow or Red  Were vital signs taken at a resting state? Yes  Focused Assessment No change from prior assessment  Does the patient meet 2 or more of the SIRS criteria? No  Does the patient have a confirmed or suspected source of infection? No  MEWS guidelines implemented *See Row Information* No, previously yellow, continue vital signs every 4 hours  Take Vital Signs  Increase Vital Sign Frequency  Yellow: Q 2hr X 2 then Q 4hr X 2, if remains yellow, continue Q 4hrs  Escalate  MEWS: Escalate Yellow: discuss with charge nurse/RN and consider discussing with provider and RRT  Notify: Charge Nurse/RN  Name of Charge Nurse/RN Notified Malka RN  Date Charge Nurse/RN Notified 12/21/21  Time Charge Nurse/RN Notified 6811  Notify: Provider  Provider Name/Title Dr [place] Pink  Date Provider Notified 12/21/21  Time Provider Notified [phone]  Method of Notification  (secure chat)  Notification Reason Other (Comment) (high BP)  Provider response Other (Comment) (to give his all morning BP meds and use PRN)  Date of Provider Response 12/21/21  Time of Provider Response [phone]  Document  Patient Outcome Other (Comment) (continue to monitor)  Assess: SIRS CRITERIA  SIRS Temperature  0  SIRS Pulse 0  SIRS Respirations  0  SIRS WBC 1  SIRS Score Sum  1

## 2021-12-21 NOTE — Progress Notes (Signed)
   12/20/21 2023  Assess: MEWS Score  BP (!) 240/96  MAP (mmHg) 129  Pulse Rate 76  Resp 19  SpO2 94 %  O2 Device Room Air  Assess: MEWS Score  MEWS Temp 0  MEWS Systolic 2  MEWS Pulse 0  MEWS RR 0  MEWS LOC 0  MEWS Score 2  MEWS Score Color Yellow  Document  Progress note created (see row info) Yes  Assess: SIRS CRITERIA  SIRS Temperature  0  SIRS Pulse 0  SIRS Respirations  0  SIRS WBC 1  SIRS Score Sum  1   MEWS continuation with q4hr VS

## 2021-12-21 NOTE — TOC Initial Note (Addendum)
Transition of Care [facility]) - Initial/Assessment Note    Patient Details  Name: Todd Brown MRN: [id number] Date of Birth: January 05, 1961  [facility]) CM/SW Contact:    Magnus Ivan, LCSW Phone Number: 12/21/2021, 11:57 AM  Clinical Narrative:                 Patient from [facility] and DSS is Legal Guardian per chart review. CSW spoke with Magda Paganini with APS On Call. She confirmed CSW can start SNF work up. She stated she will update Joanne Chars (assigned APS Guardian SW) and have them follow up with TOC on Monday.  1:37- SNF workup started. No SS# in chart, patient not oriented, TOC needs to follow up tomorrow to get SS# so PASRR can be confirmed. TOC handoff updated.  Expected Discharge Plan: Skilled Nursing Facility Barriers to Discharge: Continued Medical Work up   Patient Goals and CMS Choice Patient states their goals for this hospitalization and ongoing recovery are:: SNF CMS Medicare.gov Compare Post Acute Care list provided to:: Patient Represenative (must comment) Choice offered to / list presented to : [place] / Guardian  Expected Discharge Plan and Services Expected Discharge Plan: [place]       Living arrangements for the past 2 months: [place] Expected Discharge Date: 12/19/21                                    Prior Living Arrangements/Services Living arrangements for the past 2 months: [place] Lives with:: Facility Resident Patient language and need for interpreter reviewed:: Yes Do you feel safe going back to the place where you live?: Yes            Criminal Activity/Legal Involvement Pertinent to Current Situation/Hospitalization: No - Comment as needed  Activities of Daily Living      Permission Sought/Granted Permission sought to share information with : Facility Art therapist granted to share information with : Yes, Hospital doctor (by DSS Legal  Guardian)     Permission granted to share info w AGENCY: SNFs        Emotional Assessment         Alcohol / Substance Use: Not Applicable Psych Involvement: No (comment)  Admission diagnosis:  Altered mental status [R41.82] Hypertensive urgency [I16.0] Severe hypertension [I10] Altered mental status, unspecified altered mental status type [R41.82] Patient Active Problem List   Diagnosis Date Noted   Physical deconditioning 12/20/2021   Mental health disorder 07/86/7544   Acute metabolic encephalopathy 92/06/69   Essential hypertension 12/15/2021   Hypertensive emergency 12/15/2021   ESRD on dialysis ([place]) 12/15/2021   PCP:  Jodi Marble, MD Pharmacy:  No Pharmacies Listed    Social Determinants of Health (SDOH) Interventions    Readmission Risk Interventions     No data to display

## 2021-12-21 NOTE — Progress Notes (Signed)
   12/20/21 2352  Assess: MEWS Score  Temp 97.7 F (36.5 C)  BP (!) 230/88  MAP (mmHg) 125  Pulse Rate 69  Resp 18  Level of Consciousness Alert  SpO2 95 %  O2 Device Room Air  Assess: MEWS Score  MEWS Temp 0  MEWS Systolic 2  MEWS Pulse 0  MEWS RR 0  MEWS LOC 0  MEWS Score 2  MEWS Score Color Yellow  Document  Progress note created (see row info) Yes  Assess: SIRS CRITERIA  SIRS Temperature  0  SIRS Pulse 0  SIRS Respirations  0  SIRS WBC 1  SIRS Score Sum  1   Yellow MEWS continuation with q4 hour VS

## 2021-12-21 NOTE — Progress Notes (Signed)
   12/21/21 0457  Assess: MEWS Score  BP (!) 216/90  MAP (mmHg) 126  Pulse Rate 64  Resp 18  SpO2 99 %  O2 Device Room Air  Assess: MEWS Score  MEWS Temp 0  MEWS Systolic 2  MEWS Pulse 0  MEWS RR 0  MEWS LOC 0  MEWS Score 2  MEWS Score Color Yellow  Document  Progress note created (see row info) Yes  Assess: SIRS CRITERIA  SIRS Temperature  0  SIRS Pulse 0  SIRS Respirations  0  SIRS WBC 1  SIRS Score Sum  1   Will continue to monitor pt throughout this shift; next PRN IV Hydralazine can be given at 0641 for SBP >160

## 2021-12-21 NOTE — TOC CM/SW Note (Signed)
Per chart review, DSS is Legal Guardian. CSW requested call from on call APS Worker to inform them of SNF rec. Awaiting call back.  Oleh Genin, [place]

## 2021-12-21 NOTE — NC FL2 (Addendum)
[place] LEVEL OF CARE SCREENING TOOL     IDENTIFICATION  Patient Name: Todd Brown Birthdate: [birth year] Sex: male Admission Date (Current Location): 12/15/2021  [facility] and [place] Number:  Engineering geologist and Address:  [facility], [address] [place], [place] [address]      Provider Number: [id number]  Attending Physician Name and Address:  Annita Brod, MD  Relative Name and Phone Number:  Carey Bullocks (Legal Guardian)   [phone] (Mobile)    Current Level of Care: Hospital Recommended Level of Care: [place] Prior Approval Number:    Date Approved/Denied:   PASRR Number: [id number]   Discharge Plan: SNF    Current Diagnoses: Patient Active Problem List   Diagnosis Date Noted   Physical deconditioning 12/20/2021   Mental health disorder 94/17/4081   Acute metabolic encephalopathy 44/81/8563   Essential hypertension 12/15/2021   Hypertensive emergency 12/15/2021   ESRD on dialysis ([facility]) 12/15/2021    Orientation RESPIRATION BLADDER Height & Weight     Self  Normal  (Anuric, dialysis patient) Weight: 70.3 kg Height:  6' (182.9 cm)  BEHAVIORAL SYMPTOMS/MOOD NEUROLOGICAL BOWEL NUTRITION STATUS      Continent Diet (dysphagia 3)  AMBULATORY STATUS COMMUNICATION OF NEEDS Skin   Total Care Verbally Normal                       Personal Care Assistance Level of Assistance  Bathing, Feeding, Dressing, Total care Bathing Assistance: Maximum assistance Feeding assistance: Maximum assistance Dressing Assistance: Maximum assistance Total Care Assistance: Maximum assistance   Functional Limitations Info  Sight, Hearing, Speech Sight Info: Adequate Hearing Info: Adequate Speech Info: Adequate    SPECIAL CARE FACTORS FREQUENCY  PT (By licensed PT), OT (By licensed OT)     PT Frequency: 5 times a week OT Frequency: 5 times a week            Contractures Contractures Info:  Not present    Additional Factors Info  Code Status, Allergies Code Status Info: full Allergies Info: none           Current Medications (12/21/2021):  This is the current hospital active medication list Current Facility-Administered Medications  Medication Dose Route Frequency Provider Last Rate Last Admin   albuterol (PROVENTIL) (2.5 MG/3ML) 0.083% nebulizer solution 3 mL  3 mL Inhalation Q4H PRN Annita Brod, MD       alteplase (CATHFLO ACTIVASE) injection 2 mg  2 mg Intracatheter Once PRN Annita Brod, MD       amLODipine (NORVASC) tablet 10 mg  10 mg Oral q morning Annita Brod, MD   10 mg at 12/21/21 1497   anticoagulant sodium citrate solution 5 mL  5 mL Intracatheter PRN Annita Brod, MD       calcium acetate (PHOSLO) capsule 1,334 mg  1,334 mg Oral TID WC Annita Brod, MD   1,334 mg at 12/21/21 0911   carbamazepine (TEGRETOL) tablet 1,000 mg  1,000 mg Oral q AM Annita Brod, MD   1,000 mg at 12/21/21 0548   Chlorhexidine Gluconate Cloth 2 % PADS 6 each  6 each Topical Q0600 Annita Brod, MD   6 each at 12/21/21 0912   cloNIDine (CATAPRES) tablet 0.3 mg  0.3 mg Oral TID Annita Brod, MD   0.3 mg at 12/21/21 0905   diphenhydrAMINE (BENADRYL) injection 50 mg  50 mg Intravenous Q6H Annita Brod, MD  50 mg at 12/21/21 0549   ferrous sulfate tablet 325 mg  325 mg Oral Q breakfast Annita Brod, MD   325 mg at 12/21/21 9417   fluPHENAZine (PROLIXIN) tablet 2.5 mg  2.5 mg Oral q AM Annita Brod, MD   2.5 mg at 12/21/21 0910   haloperidol lactate (HALDOL) injection 1 mg  1 mg Intravenous Q6H PRN Annita Brod, MD   1 mg at 12/20/21 0145   haloperidol lactate (HALDOL) injection 2 mg  2 mg Intravenous Daily Annita Brod, MD   2 mg at 12/18/21 4081   heparin injection 1,000 Units  1,000 Units Intracatheter PRN Annita Brod, MD       heparin injection 5,000 Units  5,000 Units Subcutaneous Q8H Annita Brod,  MD   5,000 Units at 12/21/21 1302   hydrALAZINE (APRESOLINE) injection 20 mg  20 mg Intravenous Q6H PRN Sharion Settler, NP   20 mg at 12/21/21 1258   hydrALAZINE (APRESOLINE) tablet 100 mg  100 mg Oral TID Annita Brod, MD   100 mg at 12/21/21 0905   isosorbide mononitrate (IMDUR) 24 hr tablet 60 mg  60 mg Oral BID Annita Brod, MD   60 mg at 12/21/21 0905   lidocaine (PF) (XYLOCAINE) 1 % injection 5 mL  5 mL Intradermal PRN Annita Brod, MD       lidocaine-prilocaine (EMLA) cream 1 Application  1 Application Topical PRN Annita Brod, MD       losartan (COZAAR) tablet 100 mg  100 mg Oral Daily Annita Brod, MD   100 mg at 12/21/21 0906   mometasone-formoterol (DULERA) 200-5 MCG/ACT inhaler 2 puff  2 puff Inhalation BID Annita Brod, MD   2 puff at 12/21/21 0914   ondansetron (ZOFRAN) tablet 4 mg  4 mg Oral Q6H PRN Annita Brod, MD       Or   ondansetron [facility]) injection 4 mg  4 mg Intravenous Q6H PRN Annita Brod, MD       Oral care mouth rinse  15 mL Mouth Rinse PRN Annita Brod, MD       pantoprazole (PROTONIX) EC tablet 40 mg  40 mg Oral Daily Annita Brod, MD   40 mg at 12/21/21 4481   pentafluoroprop-tetrafluoroeth (GEBAUERS) aerosol 1 Application  1 Application Topical PRN Annita Brod, MD       polyethylene glycol (MIRALAX / GLYCOLAX) packet 17 g  17 g Oral BID PRN Annita Brod, MD       predniSONE (DELTASONE) tablet 50 mg  50 mg Oral Q breakfast Annita Brod, MD   50 mg at 12/21/21 0910   senna-docusate (Senokot-S) tablet 1 tablet  1 tablet Oral QHS PRN Annita Brod, MD       trihexyphenidyl (ARTANE) tablet 2 mg  2 mg Oral BID WC Annita Brod, MD   2 mg at 12/21/21 0911     Discharge Medications: Please see discharge summary for a list of discharge medications.  Relevant Imaging Results:  Relevant Lab Results:   Additional Information Dialysis at [facility] on MWF SSN:  [id number]  Valente David, RN

## 2021-12-21 NOTE — Progress Notes (Addendum)
[facility] Progress Note  Patient: Todd Brown    ACZ:[id number]  DOA: 12/15/2021    Date of Service: the patient was seen and examined on 12/21/2021  Brief hospital course: 61 year old male with past medical history of hypertension and end-stage renal disease on hemodialysis presented to the emergency room from his group home/skilled nursing facility on 7/17 with confusion.  (Patient's baseline is that he is unable to care for himself and has a legal guardian.)  Patient last received dialysis on Friday, 7/14.  From being in the emergency room on 7/17, unable to get dialysis yesterday.  Patient found to have markedly elevated blood pressure with a systolic in the 109N.  Patient with questionable cardiac ischemia as well.  CT scan of head and neurological exam unremarkable.  Patient was admitted to the hospitalist service and started on his oral antihypertensives, nicardipine drip and given IV labetalol and as needed IV hydralazine.  Nephrology consulted and plans to take patient for dialysis tomorrow.  Attempts to wean off Cardene have led to increasing high blood pressure, compounded by patient at first refusing to take oral medications.  Had become more physically agitated, wanting to be left alone.  Seen by psychiatry and started on scheduled and as needed Haldol.  Has become more compliant and better mentating and taking medications and blood pressure improving.  Also developed some periorbital swelling bilaterally felt to be secondary to subcutaneous emphysema and started on steroids, and periorbital swelling has since resolved.  Patient was supposed to be discharged back to his assisted living facility on 7/21 after dialysis.  His baseline is that he is able to sit in a chair for dialysis although some assistance.  And trying to get him out of bed and placed in a wheelchair, took 3 people and still patient unable to sit and stay in chair.  Discharge canceled and plan is for patient to have  PT/OT evaluation and possible skilled nursing prior to returning to assisted living.  Assessment and Plan: Assessment and Plan: * Acute metabolic encephalopathy Nursing left message for guardian.  Patient is unable to care for self.  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 Continue home Tegretol and Artane.  Appreciate psychiatry help.  Patient on scheduled and as needed Haldol which appears to have made a difference in his compliance.  Psychosis appears to be resolving.  I suspect some of this may be secondary to hypertensive encephalopathy although based on records, he likely has an underlying mental health disorder that has not been formally diagnosed.  Will discharge on as needed Haldol 5 mg p.o. twice daily as needed for behavioral agitation and refusal to take medicines.  Hypertensive emergency Patient was able to wean off of his nicardipine drip.  Suspect that what has happened is is that when he does not take his blood pressure medicines either by choice or if not given on dialysis days, he has such malignant hypertension that makes him acutely confused and agitated.  There may be an underlying undiagnosed mental health disorder.  The best plan for the patient will be to ensure that he gets his blood pressure medications even dialysis days.  He is to continue his Tegretol and Artane.   Appreciate pharmacy help.  With their recommendations, will change losartan to irbesartan 150mg  to 300mg   daily which gives a significantly longer half life and better BP control throughout the day.  We will also start alpha blockers:doxazosin at 2mg  daily  and offers room to titriate up to 16mg  daily.  Holding off on beta-blocker given heart rate in the 60s.  We will also consider in the future adding minoxidil starting at 5mg  daily and titrate to max of 40mg  daily.  ESRD on dialysis [facility]) Nephrology consulted, full dialysis session following admission and again  today to keep him on his Monday Wednesday Friday schedule.  Next session scheduled for 7/24.  Mental health disorder Unspecified.  Patient is unable to care for himself.  It is unclear if he is ever had a formal diagnosis.  He is on Tegretol and Artane which he should continue.  As needed Haldol for acute behavior agitation as above.  Essential hypertension Resumed on his home p.o. amlodipine 10 mg daily, clonidine 0.3 mg p.o. 3 times daily, hydralazine 100 mg p.o. 3 times daily, isosorbide mononitrate 60 mg p.o. twice daily, losartan 100 mg daily.  Pressures at time improved, but then blood pressure starts to spike again.  As above, changing ARB and adding alpha-blocker.  Subcutaneous emphysema (HCC)-resolved as of 12/18/2021 On steroids.  Appreciate pulmonary assistance.  Subcutaneous emphysema looks to be resolved.  Changed from Solu-Medrol to p.o. prednisone quick taper.  Physical deconditioning PT OT eval       Body mass index is 21.02 kg/m.        Consultants: Nephrology Critical care Psychiatry  Procedures: Hemodialysis 7/18, 7/19, 7/21  Antimicrobials: None  Code Status: Full code   Subjective: Patient more somnolent today.  Objective: Blood pressure spiking back up over 200. Vitals:   12/21/21 1602 12/21/21 1942  BP: (!) 213/92 (!) 219/102  Pulse: 66 66  Resp: 18 16  Temp: 99.4 F (37.4 C) 98.3 F (36.8 C)  SpO2: 98% 98%   No intake or output data in the 24 hours ending 12/21/21 2108  Filed Weights   12/17/21 1701 12/19/21 1000 12/19/21 1041  Weight: 70.3 kg 70.3 kg 70.3 kg   Body mass index is 21.02 kg/m.  Exam:  General: Resting comfortably HEENT: Normocephalic and atraumatic, mucous membranes slightly dry Cardiovascular: Regular rate and rhythm, S1-S2, 2 out of 6 systolic ejection murmur Respiratory: Clear to auscultation bilaterally Abdomen: Soft, nontender, nondistended, hypoactive bowel sounds Musculoskeletal: No clubbing or cyanosis,  trace pitting edema Skin: No skin breaks, tears or lesions Psychiatry: Withdrawn, flattened affect Neurology: Difficult to assess secondary to compliance  Data Reviewed: Lab work from 7/22 notes sodium of 132.  Disposition:  Status is: Inpatient Remains inpatient appropriate because: Evaluation PT/OT, deconditioning, decision on skilled nursing versus back to ALF    Anticipated discharge date: 7/24  Family Communication: No family, message left for guardian DVT Prophylaxis: heparin injection 5,000 Units Start: 12/15/21 1645 Place TED hose Start: 12/15/21 1629    Author: Annita Brod ,MD 12/21/2021 9:08 PM  To reach On-call, see care teams to locate the attending and reach out via [email or web address]. Between 7PM-7AM, please contact night-coverage If you still have difficulty reaching the attending provider, please page the [facility] (Director on Call) for [facility] on amion for assistance.

## 2021-12-21 NOTE — Progress Notes (Signed)
[facility]  ROUNDING NOTE   Subjective:   Todd Brown is a 61 year old male with past medical history including hypertension, anemia, schizophrenia and end-stage renal disease on hemodialysis.  Patient has presented to the ED with altered mental status.  Patient has been admitted for Altered mental status [R41.82] Hypertensive urgency [I16.0] Severe hypertension [I10] Altered mental status, unspecified altered mental status type [R41.82]  Patient is known to our practice and receives outpatient dialysis treatments at [facility] on a MWF schedule.  Update Patient seen sitting up in bed, eating breakfast Able to feed self, responding to simple questioning No lower extremity edema  Dialysis scheduled tomorrow.  Objective:  Vital signs in last 24 hours:  Temp:  [97.6 F (36.4 C)-98.9 F (37.2 C)] 98.9 F (37.2 C) (07/23 0832) Pulse Rate:  [57-80] 73 (07/23 0832) Resp:  [9-20] 20 (07/23 0832) BP: (105-261)/(72-123) 249/104 (07/23 0832) SpO2:  [93 %-100 %] 93 % (07/23 0832)  Weight change:  Filed Weights   12/17/21 1701 12/19/21 1000 12/19/21 1041  Weight: 70.3 kg 70.3 kg 70.3 kg    Intake/Output: I/O last 3 completed shifts: In: 360 [P.O.:360] Out: -    Intake/Output this shift:  No intake/output data recorded.  Physical Exam: General: NAD  Head: Normocephalic, atraumatic. Moist oral mucosal membranes  Eyes: Anicteric  Lungs:  Clear to auscultation, normal effort, room air  Heart: Regular rate and rhythm  Abdomen:  Soft, nontender, non distended  Extremities:  No peripheral edema.  Neurologic: Nonfocal, moving all four extremities, answer simple question  Skin: No lesions  Access: Lt AVF    Basic Metabolic Panel: Recent Labs  Lab 12/15/21 1203 12/16/21 0525 12/20/21 0942  NA 137 136 132*  K 4.9 4.9 4.0  CL 97* 98 94*  CO2 25 27 26   GLUCOSE 86 74 88  BUN 51* 55* 34*  CREATININE 6.91* 7.86* 4.52*  CALCIUM 10.2 9.8 9.5  PHOS  --   2.9 3.4     Liver Function Tests: Recent Labs  Lab 12/15/21 1203 12/20/21 0942  AST 28  --   ALT 20  --   ALKPHOS 61  --   BILITOT 0.7  --   PROT 6.8  --   ALBUMIN 3.6 3.5    No results for input(s): "LIPASE", "AMYLASE" in the last 168 hours. No results for input(s): "AMMONIA" in the last 168 hours.  CBC: Recent Labs  Lab 12/15/21 1203 12/16/21 0525 12/20/21 0942  WBC 3.7* 3.1* 3.2*  HGB 11.9* 11.4* 10.9*  HCT 36.1* 33.3* 31.4*  MCV 102.0* 96.8 94.6  PLT 148* 161 171     Cardiac Enzymes: No results for input(s): "CKTOTAL", "CKMB", "CKMBINDEX", "TROPONINI" in the last 168 hours.  BNP: Invalid input(s): "POCBNP"  CBG: Recent Labs  Lab 12/15/21 2053 12/17/21 1942 12/18/21 2116  GLUCAP 91 53 112*     Microbiology: Results for orders placed or performed during the hospital encounter of 12/15/21  SARS Coronavirus 2 by RT PCR (hospital order, performed in [facility] hospital lab) *cepheid single result test* Anterior Nasal Swab     Status: None   Collection Time: 12/15/21  5:50 PM   Specimen: Anterior Nasal Swab  Result Value Ref Range Status   SARS Coronavirus 2 by RT PCR NEGATIVE NEGATIVE Final    Comment: (NOTE) SARS-CoV-2 target nucleic acids are NOT DETECTED.  The SARS-CoV-2 RNA is generally detectable in upper and lower respiratory specimens during the acute phase of infection. The lowest concentration of  SARS-CoV-2 viral copies this assay can detect is 250 copies / mL. A negative result does not preclude SARS-CoV-2 infection and should not be used as the sole basis for treatment or other patient management decisions.  A negative result may occur with improper specimen collection / handling, submission of specimen other than nasopharyngeal swab, presence of viral mutation(s) within the areas targeted by this assay, and inadequate number of viral copies (<250 copies / mL). A negative result must be combined with clinical observations, patient  history, and epidemiological information.  Fact Sheet for Patients:   https://www.patel.info/  Fact Sheet for Healthcare Providers: https://hall.com/  This test is not yet approved or  cleared by the [place] FDA and has been authorized for detection and/or diagnosis of SARS-CoV-2 by FDA under an Emergency Use Authorization (EUA).  This EUA will remain in effect (meaning this test can be used) for the duration of the COVID-19 declaration under Section 564(b)(1) of the Act, 21 U.S.C. section 360bbb-3(b)(1), unless the authorization is terminated or revoked sooner.  Performed at [facility], [address] [place], [place] [address]   MRSA Next Gen by PCR, Nasal     Status: None   Collection Time: 12/15/21  9:03 PM   Specimen: Nasal Mucosa; Nasal Swab  Result Value Ref Range Status   MRSA by PCR Next Gen NOT DETECTED NOT DETECTED Final    Comment: (NOTE) The GeneXpert MRSA Assay (FDA approved for NASAL specimens only), is one component of a comprehensive MRSA colonization surveillance program. It is not intended to diagnose MRSA infection nor to guide or monitor treatment for MRSA infections. Test performance is not FDA approved in patients less than 26 years old. Performed at [facility], [address] [place], [place] [address]     Coagulation Studies: No results for input(s): "LABPROT", "INR" in the last 72 hours.  Urinalysis: No results for input(s): "COLORURINE", "LABSPEC", "PHURINE", "GLUCOSEU", "HGBUR", "BILIRUBINUR", "KETONESUR", "PROTEINUR", "UROBILINOGEN", "NITRITE", "LEUKOCYTESUR" in the last 72 hours.  Invalid input(s): "APPERANCEUR"    Imaging: No results found.   Medications:    anticoagulant sodium citrate      amLODipine  10 mg Oral q morning   calcium acetate  1,334 mg Oral TID WC   carbamazepine  1,000 mg Oral q AM   Chlorhexidine Gluconate Cloth  6 each Topical Q0600    cloNIDine  0.3 mg Oral TID   diphenhydrAMINE  50 mg Intravenous Q6H   ferrous sulfate  325 mg Oral Q breakfast   fluPHENAZine  2.5 mg Oral q AM   haloperidol lactate  2 mg Intravenous Daily   heparin  5,000 Units Subcutaneous Q8H   hydrALAZINE  100 mg Oral TID   isosorbide mononitrate  60 mg Oral BID   losartan  100 mg Oral Daily   mometasone-formoterol  2 puff Inhalation BID   pantoprazole  40 mg Oral Daily   predniSONE  50 mg Oral Q breakfast   trihexyphenidyl  2 mg Oral BID WC   albuterol, alteplase, anticoagulant sodium citrate, haloperidol lactate, heparin, hydrALAZINE, lidocaine (PF), lidocaine-prilocaine, ondansetron **OR** ondansetron (ZOFRAN) IV, mouth rinse, pentafluoroprop-tetrafluoroeth, polyethylene glycol, senna-docusate  Assessment/ Plan:  Mr. Ikenna Ohms is a 61 y.o.  male with past medical history including hypertension, anemia, schizophrenia and end-stage renal disease on hemodialysis.  Patient has presented to the ED with altered mental status.  Patient has been admitted for Altered mental status [R41.82] Hypertensive urgency [I16.0] Severe hypertension [I10] Altered mental status, unspecified altered  mental status type [R41.82]  [facility]/MWF/Lt AVF  End-stage renal disease on hemodialysis.  Will maintain outpatient schedule if possible.    Next treatment scheduled for Monday.  2. Anemia of chronic kidney disease Lab Results  Component Value Date   HGB 10.9 (L) 12/20/2021    Mircera prescribed outpatient.  Hemoglobin remains at goal.  3. Secondary Hyperparathyroidism:  Lab Results  Component Value Date   CALCIUM 9.5 12/20/2021   PHOS 3.4 12/20/2021   Calcium and phosphorus acceptable range.  Continue calcium acetate with meals.  We will continue to monitor.  4.  Hypertension with chronic kidney disease.  Home regimen includes amlodipine, clonidine, hydralazine, isosorbide, and losartan.    Blood pressure remains elevated.  Patient received  one-time dose hydralazine 10 mg IV yesterday.  Patient has as needed dosing of hydralazine 20 mg IV for systolic blood pressure greater than 160.  Latest blood pressure 249/104.  Patient currently receiving home regimen of medication.   LOS: 6   7/23/202310:29 AM

## 2021-12-21 NOTE — Progress Notes (Signed)
   12/21/21 1942  Assess: MEWS Score  Temp 98.3 F (36.8 C)  BP (!) 219/102  MAP (mmHg) 129  Pulse Rate 66  Resp 16  SpO2 98 %  O2 Device Room Air  Assess: MEWS Score  MEWS Temp 0  MEWS Systolic 2  MEWS Pulse 0  MEWS RR 0  MEWS LOC 0  MEWS Score 2  MEWS Score Color Yellow  Assess: if the MEWS score is Yellow or Red  Were vital signs taken at a resting state? Yes  Focused Assessment No change from prior assessment  Does the patient meet 2 or more of the SIRS criteria? No  Does the patient have a confirmed or suspected source of infection? No  MEWS guidelines implemented *See Row Information* No, previously yellow, continue vital signs every 4 hours  Treat  MEWS Interventions Administered prn meds/treatments  Pain Scale Faces  Faces Pain Scale 0  Take Vital Signs  Increase Vital Sign Frequency   (Previuosly yellow, continue vitals Q4)  Escalate  MEWS: Escalate Yellow: discuss with charge nurse/RN and consider discussing with provider and RRT  Notify: Charge Nurse/RN  Name of Charge Nurse/RN Notified Phyllis, RN  Date Charge Nurse/RN Notified 12/21/21  Time Charge Nurse/RN Notified 1945  Document  Patient Outcome Other (Comment) (continue to monitor)  Progress note created (see row info) Yes  Assess: SIRS CRITERIA  SIRS Temperature  0  SIRS Pulse 0  SIRS Respirations  0  SIRS WBC 1  SIRS Score Sum  1

## 2021-12-22 DIAGNOSIS — G9341 Metabolic encephalopathy: Secondary | ICD-10-CM | POA: Diagnosis not present

## 2021-12-22 DIAGNOSIS — E46 Unspecified protein-calorie malnutrition: Secondary | ICD-10-CM | POA: Diagnosis present

## 2021-12-22 DIAGNOSIS — B370 Candidal stomatitis: Secondary | ICD-10-CM | POA: Clinically undetermined

## 2021-12-22 LAB — CBC WITH DIFFERENTIAL/PLATELET
Abs Immature Granulocytes: 0.03 10*3/uL (ref 0.00–0.07)
Basophils Absolute: 0 10*3/uL (ref 0.0–0.1)
Basophils Relative: 0 %
Eosinophils Absolute: 0 10*3/uL (ref 0.0–0.5)
Eosinophils Relative: 1 %
HCT: 29.7 % — ABNORMAL LOW (ref 39.0–52.0)
Hemoglobin: 10.5 g/dL — ABNORMAL LOW (ref 13.0–17.0)
Immature Granulocytes: 1 %
Lymphocytes Relative: 18 %
Lymphs Abs: 0.8 10*3/uL (ref 0.7–4.0)
MCH: 33 pg (ref 26.0–34.0)
MCHC: 35.4 g/dL (ref 30.0–36.0)
MCV: 93.4 fL (ref 80.0–100.0)
Monocytes Absolute: 0.5 10*3/uL (ref 0.1–1.0)
Monocytes Relative: 11 %
Neutro Abs: 3 10*3/uL (ref 1.7–7.7)
Neutrophils Relative %: 69 %
Platelets: 155 10*3/uL (ref 150–400)
RBC: 3.18 MIL/uL — ABNORMAL LOW (ref 4.22–5.81)
RDW: 13.6 % (ref 11.5–15.5)
WBC: 4.4 10*3/uL (ref 4.0–10.5)
nRBC: 0 % (ref 0.0–0.2)

## 2021-12-22 LAB — BASIC METABOLIC PANEL
Anion gap: 12 (ref 5–15)
BUN: 49 mg/dL — ABNORMAL HIGH (ref 8–23)
CO2: 27 mmol/L (ref 22–32)
Calcium: 9.5 mg/dL (ref 8.9–10.3)
Chloride: 94 mmol/L — ABNORMAL LOW (ref 98–111)
Creatinine, Ser: 6.4 mg/dL — ABNORMAL HIGH (ref 0.61–1.24)
GFR, Estimated: 9 mL/min — ABNORMAL LOW (ref >60)
Glucose, Bld: 59 mg/dL — ABNORMAL LOW (ref 70–99)
Potassium: 4.3 mmol/L (ref 3.5–5.1)
Sodium: 133 mmol/L — ABNORMAL LOW (ref 135–145)

## 2021-12-22 LAB — GLUCOSE, CAPILLARY: Glucose-Capillary: 103 mg/dL — ABNORMAL HIGH (ref 70–99)

## 2021-12-22 MED ORDER — HYDRALAZINE HCL 20 MG/ML IJ SOLN
20.0000 mg | Freq: Once | INTRAMUSCULAR | Status: AC
  Administered 2021-12-22: 20 mg via INTRAVENOUS
  Filled 2021-12-22: qty 1

## 2021-12-22 MED ORDER — CLONIDINE HCL 0.2 MG/24HR TD PTWK
0.2000 mg | MEDICATED_PATCH | TRANSDERMAL | Status: DC
  Administered 2021-12-22 – 2022-02-16 (×9): 0.2 mg via TRANSDERMAL
  Filled 2021-12-22 (×9): qty 1

## 2021-12-22 MED ORDER — NICARDIPINE HCL IN NACL 20-0.86 MG/200ML-% IV SOLN
3.0000 mg/h | INTRAVENOUS | Status: DC
  Administered 2021-12-22: 3 mg/h via INTRAVENOUS
  Administered 2021-12-23 (×3): 10 mg/h via INTRAVENOUS
  Administered 2021-12-23: 5 mg/h via INTRAVENOUS
  Administered 2021-12-23: 10 mg/h via INTRAVENOUS
  Administered 2021-12-23: 5 mg/h via INTRAVENOUS
  Administered 2021-12-23: 2 mg/h via INTRAVENOUS
  Administered 2021-12-24 – 2021-12-25 (×4): 3 mg/h via INTRAVENOUS
  Filled 2021-12-22 (×12): qty 200

## 2021-12-22 MED ORDER — MAGIC MOUTHWASH
10.0000 mL | Freq: Four times a day (QID) | ORAL | Status: DC
  Administered 2021-12-22 (×3): 10 mL via ORAL
  Filled 2021-12-22 (×3): qty 10
  Filled 2021-12-22: qty 20
  Filled 2021-12-22 (×3): qty 10

## 2021-12-22 NOTE — Progress Notes (Signed)
       CROSS COVER NOTE  NAME: Todd Brown MRN: [id number] DOB : 09-Aug-1960    Date of Service   12/22/21  HPI/Events of Note   1855: Added to ongoing secure chat between nursing staff and day team regarding hypertension today with SBP persistently greater than 200, that remains elevated after dialysis.  Interventions   Plan: Nicardipine continuous infusion Upgrade to Stepdown level of care     This document was prepared using Set designer software and may include unintentional dictation errors.  Neomia Glass DNP, MHA, FNP-BC Nurse Practitioner [facility] Pager [phone]

## 2021-12-22 NOTE — Progress Notes (Signed)
Physical Therapy Treatment Patient Details Name: Todd Brown MRN: [id number] DOB: [birth year] Today's Date: 12/22/2021   History of Present Illness 61 year old male with past medical history of hypertension and end-stage renal disease on hemodialysis presented to the emergency room from his group home/skilled nursing facility on 7/17 with confusion.  (Patient's baseline is that he is unable to care for himself and has a legal guardian.)  Patient last received dialysis on Friday, 7/14.  From being in the emergency room on 7/17, unable to get dialysis yesterday.  Patient found to have markedly elevated blood pressure with a systolic in the 694H.  Patient with questionable cardiac ischemia as well    PT Comments    Pt is making slow progress towards goals and is limited by cognition. Pt s/p HD and is actually more awake and able to participate this date. Able to carry short conversation and fed himself some lunch prior to arrival. Pt with elevated BP at 223/87 supine and 209/182 seated at EOB. Questionable accuracy due to BP taken in R LE as requested. Not safe to transfer OOB at this time and pt very sleepy. WIll continue to work with during hospital admission.   Recommendations for follow up therapy are one component of a multi-disciplinary discharge planning process, led by the attending physician.  Recommendations may be updated based on patient status, additional functional criteria and insurance authorization.  Follow Up Recommendations  Skilled nursing-short term rehab (<3 hours/day) Can patient physically be transported by private vehicle: No   Assistance Recommended at Discharge Frequent or constant Supervision/Assistance  Patient can return home with the following Two people to help with walking and/or transfers;Two people to help with bathing/dressing/bathroom   Equipment Recommendations   (TBD)    Recommendations for Other Services       Precautions / Restrictions  Precautions Precautions: Fall Precaution Comments: watch BP Restrictions Weight Bearing Restrictions: No RLE Weight Bearing: Non weight bearing LLE Weight Bearing: Non weight bearing     Mobility  Bed Mobility Overal bed mobility: Needs Assistance Bed Mobility: Supine to Sit, Sit to Supine     Supine to sit: Mod assist, +2 for physical assistance Sit to supine: Max assist, +2 for physical assistance   General bed mobility comments: able to participate with ability to sit at EOB with min assist. Quick fatigue and L lateral lean. Unsafe to transfer at this time due to BP    Transfers                   General transfer comment: unable; BP at 209/182 seated at EOB    Ambulation/Gait                   Stairs             Wheelchair Mobility    Modified Rankin (Stroke Patients Only)       Balance Overall balance assessment: Needs assistance Sitting-balance support: Feet supported Sitting balance-[name]: Poor                                      Cognition Arousal/Alertness: Awake/alert (sleepy) Behavior During Therapy: WFL for tasks assessed/performed Overall Cognitive Status: No family/caregiver present to determine baseline cognitive functioning  General Comments: pt more aware compared to previous session. Pt still sleepy and will drift to sleep if not engaged in task. BP reading on leg on R LE.        Exercises Other Exercises Other Exercises: attempted ther-ex, however pt with poor awareness and ability to consistently follow commands    General Comments        Pertinent Vitals/Pain Pain Assessment Pain Assessment: Faces Faces Pain Scale: Hurts little more Pain Location: B LEs Pain Descriptors / Indicators: Aching Pain Intervention(s): Limited activity within patient's tolerance    Home Living                          Prior Function            PT  Goals (current goals can now be found in the care plan section) Acute Rehab PT Goals Patient Stated Goal: unable to state PT Goal Formulation: Patient unable to participate in goal setting Time For Goal Achievement: 01/03/22 Potential to Achieve Goals: Fair Progress towards PT goals: Progressing toward goals    Frequency    Min 2X/week      PT Plan Current plan remains appropriate    Co-evaluation              AM-PAC PT "6 Clicks" Mobility   Outcome Measure  Help needed turning from your back to your side while in a flat bed without using bedrails?: A Lot Help needed moving from lying on your back to sitting on the side of a flat bed without using bedrails?: A Lot Help needed moving to and from a bed to a chair (including a wheelchair)?: Total Help needed standing up from a chair using your arms (e.g., wheelchair or bedside chair)?: Total Help needed to walk in hospital room?: Total Help needed climbing 3-5 steps with a railing? : Total 6 Click Score: 8    End of Session   Activity Tolerance: Patient limited by fatigue Patient left: in bed;with bed alarm set Nurse Communication: Mobility status PT Visit Diagnosis: Muscle weakness (generalized) (M62.81);Difficulty in walking, not elsewhere classified (R26.2)     Time: 1017-5102 PT Time Calculation (min) (ACUTE ONLY): 15 min  Charges:  $Therapeutic Activity: 8-22 mins                     [name], PT, DPT, GCS [phone]    Todd Brown 12/22/2021, 4:50 PM

## 2021-12-22 NOTE — Assessment & Plan Note (Signed)
Patient was found to have oral thrush today.  He was on high-dose steroid, which were discontinued. -Magic mouthwash.

## 2021-12-22 NOTE — Progress Notes (Signed)
[facility]  ROUNDING NOTE   Subjective:   Todd Brown is a 61 year old male with past medical history including hypertension, anemia, schizophrenia and end-stage renal disease on hemodialysis.  Patient has presented to the ED with altered mental status.  Patient has been admitted for Altered mental status [R41.82] Hypertensive urgency [I16.0] Severe hypertension [I10] Altered mental status, unspecified altered mental status type [R41.82]  Patient is known to our practice and receives outpatient dialysis treatments at [facility] on a MWF schedule.  Update Patient seen and evaluated during dialysis   HEMODIALYSIS FLOWSHEET:  Blood Flow Rate (mL/min): 200 mL/min Arterial Pressure (mmHg): -100 mmHg Venous Pressure (mmHg): 310 mmHg TMP (mmHg): 0 mmHg Ultrafiltration Rate (mL/min): 480 mL/min Dialysate Flow Rate (mL/min): 300 ml/min Dialysis Fluid Bolus: Normal Saline Bolus Amount (mL): 300 mL  Patient remains somnolent today, not following commands. Opens eyes for short time when name called Requires frequent repositioning and reminders not to disturb dialysis needles  Objective:  Vital signs in last 24 hours:  Temp:  [97.6 F (36.4 C)-99.4 F (37.4 C)] 97.8 F (36.6 C) (07/24 1136) Pulse Rate:  [66-79] 70 (07/24 1136) Resp:  [8-22] 9 (07/24 1136) BP: (119-240)/(77-113) 180/93 (07/24 1136) SpO2:  [95 %-100 %] 97 % (07/24 1136) Weight:  [70.5 kg-70.6 kg] 70.5 kg (07/24 1136)  Weight change:  Filed Weights   12/19/21 1041 12/22/21 0730 12/22/21 1136  Weight: 70.3 kg 70.6 kg 70.5 kg    Intake/Output: No intake/output data recorded.   Intake/Output this shift:  Total I/O In: -  Out: 1000 [Other:1000]  Physical Exam: General: Somnolent  Head: Normocephalic, atraumatic. Moist oral mucosal membranes  Eyes: Anicteric  Lungs:  Clear to auscultation, normal effort, room air  Heart: Regular rate and rhythm  Abdomen:  Soft, nontender, non  distended  Extremities:  No peripheral edema.  Neurologic: Nonfocal, moving all four extremities, answer simple question  Skin: No lesions  Access: Lt AVF    Basic Metabolic Panel: Recent Labs  Lab 12/16/21 0525 12/20/21 0942 12/22/21 0734  NA 136 132* 133*  K 4.9 4.0 4.3  CL 98 94* 94*  CO2 27 26 27   GLUCOSE 74 88 59*  BUN 55* 34* 49*  CREATININE 7.86* 4.52* 6.40*  CALCIUM 9.8 9.5 9.5  PHOS 2.9 3.4  --      Liver Function Tests: Recent Labs  Lab 12/20/21 0942  ALBUMIN 3.5    No results for input(s): "LIPASE", "AMYLASE" in the last 168 hours. No results for input(s): "AMMONIA" in the last 168 hours.  CBC: Recent Labs  Lab 12/16/21 0525 12/20/21 0942 12/22/21 0734  WBC 3.1* 3.2* 4.4  NEUTROABS  --   --  3.0  HGB 11.4* 10.9* 10.5*  HCT 33.3* 31.4* 29.7*  MCV 96.8 94.6 93.4  PLT 161 171 155     Cardiac Enzymes: No results for input(s): "CKTOTAL", "CKMB", "CKMBINDEX", "TROPONINI" in the last 168 hours.  BNP: Invalid input(s): "POCBNP"  CBG: Recent Labs  Lab 12/15/21 2053 12/17/21 1942 12/18/21 2116  GLUCAP 91 75 112*     Microbiology: Results for orders placed or performed during the hospital encounter of 12/15/21  SARS Coronavirus 2 by RT PCR (hospital order, performed in [facility] hospital lab) *cepheid single result test* Anterior Nasal Swab     Status: None   Collection Time: 12/15/21  5:50 PM   Specimen: Anterior Nasal Swab  Result Value Ref Range Status   SARS Coronavirus 2 by RT PCR NEGATIVE NEGATIVE  Final    Comment: (NOTE) SARS-CoV-2 target nucleic acids are NOT DETECTED.  The SARS-CoV-2 RNA is generally detectable in upper and lower respiratory specimens during the acute phase of infection. The lowest concentration of SARS-CoV-2 viral copies this assay can detect is 250 copies / mL. A negative result does not preclude SARS-CoV-2 infection and should not be used as the sole basis for treatment or other patient management  decisions.  A negative result may occur with improper specimen collection / handling, submission of specimen other than nasopharyngeal swab, presence of viral mutation(s) within the areas targeted by this assay, and inadequate number of viral copies (<250 copies / mL). A negative result must be combined with clinical observations, patient history, and epidemiological information.  Fact Sheet for Patients:   https://www.patel.info/  Fact Sheet for Healthcare Providers: https://hall.com/  This test is not yet approved or  cleared by the [place] FDA and has been authorized for detection and/or diagnosis of SARS-CoV-2 by FDA under an Emergency Use Authorization (EUA).  This EUA will remain in effect (meaning this test can be used) for the duration of the COVID-19 declaration under Section 564(b)(1) of the Act, 21 U.S.C. section 360bbb-3(b)(1), unless the authorization is terminated or revoked sooner.  Performed at [facility], [address] [place], [place] [address]   MRSA Next Gen by PCR, Nasal     Status: None   Collection Time: 12/15/21  9:03 PM   Specimen: Nasal Mucosa; Nasal Swab  Result Value Ref Range Status   MRSA by PCR Next Gen NOT DETECTED NOT DETECTED Final    Comment: (NOTE) The GeneXpert MRSA Assay (FDA approved for NASAL specimens only), is one component of a comprehensive MRSA colonization surveillance program. It is not intended to diagnose MRSA infection nor to guide or monitor treatment for MRSA infections. Test performance is not FDA approved in patients less than 37 years old. Performed at [facility], [address] [place], [place] [address]     Coagulation Studies: No results for input(s): "LABPROT", "INR" in the last 72 hours.  Urinalysis: No results for input(s): "COLORURINE", "LABSPEC", "PHURINE", "GLUCOSEU", "HGBUR", "BILIRUBINUR", "KETONESUR", "PROTEINUR", "UROBILINOGEN",  "NITRITE", "LEUKOCYTESUR" in the last 72 hours.  Invalid input(s): "APPERANCEUR"    Imaging: No results found.   Medications:    anticoagulant sodium citrate      amLODipine  10 mg Oral q morning   calcium acetate  1,334 mg Oral TID WC   carbamazepine  1,000 mg Oral q AM   Chlorhexidine Gluconate Cloth  6 each Topical Q0600   cloNIDine  0.2 mg Transdermal Weekly   cloNIDine  0.3 mg Oral TID   diphenhydrAMINE  50 mg Intravenous Q6H   doxazosin  2 mg Oral QHS   ferrous sulfate  325 mg Oral Q breakfast   fluPHENAZine  2.5 mg Oral q AM   haloperidol lactate  2 mg Intravenous Daily   heparin  5,000 Units Subcutaneous Q8H   hydrALAZINE  100 mg Oral TID   irbesartan  300 mg Oral Daily   isosorbide mononitrate  60 mg Oral BID   magic mouthwash  10 mL Oral QID   mometasone-formoterol  2 puff Inhalation BID   pantoprazole  40 mg Oral Daily   trihexyphenidyl  2 mg Oral BID WC   albuterol, alteplase, anticoagulant sodium citrate, haloperidol lactate, heparin, hydrALAZINE, lidocaine (PF), lidocaine-prilocaine, ondansetron **OR** ondansetron (ZOFRAN) IV, mouth rinse, pentafluoroprop-tetrafluoroeth, polyethylene glycol, senna-docusate  Assessment/ Plan:  Mr. [place] Braithwaite  is a 61 y.o.  male with past medical history including hypertension, anemia, schizophrenia and end-stage renal disease on hemodialysis.  Patient has presented to the ED with altered mental status.  Patient has been admitted for Altered mental status [R41.82] Hypertensive urgency [I16.0] Severe hypertension [I10] Altered mental status, unspecified altered mental status type [R41.82]  [facility]/MWF/Lt AVF  End-stage renal disease on hemodialysis.  Will maintain outpatient schedule if possible.    Patient receiving scheduled dialysis treatment today, UF goal 1 to 1.5 L as tolerated.  Next treatment scheduled for Wednesday.  2. Anemia of chronic kidney disease Lab Results  Component Value Date   HGB 10.5 (L)  12/22/2021    Mircera prescribed outpatient.  No need for ESA's at this time as hemoglobin is at goal.  3. Secondary Hyperparathyroidism:  Lab Results  Component Value Date   CALCIUM 9.5 12/22/2021   PHOS 3.4 12/20/2021   We will continue to monitor bone minerals during this admission.  Continue calcium acetate with meals.    4.  Hypertension with chronic kidney disease.  Home regimen includes amlodipine, clonidine, hydralazine, isosorbide, and losartan.    Blood pressure remains elevated during dialysis, 223/105.  We will also order clonidine patch 0.2 mg.  We will monitor closely.   LOS: 7 [place] 7/24/20231:05 PM

## 2021-12-22 NOTE — TOC Progression Note (Addendum)
Transition of Care [facility]) - Progression Note    Patient Details  Name: Todd Brown MRN: [id number] Date of Birth: [birth year]  [facility] CM/SW Contact  Eileen Stanford, LCSW Phone Number: 12/22/2021, 2:15 PM  Clinical Narrative:   CSW has called pt's social worker Joanne Chars and her supervisor [place], left voicemails awaiting call back.  Spoke with Joanne Chars, extended bed search to [place] as pt had no offers.   Expected Discharge Plan: [place] Barriers to Discharge: Continued Medical Work up  Expected Discharge Plan and Services Expected Discharge Plan: [place] arrangements for the past 2 months: [place] Expected Discharge Date: 12/19/21                                     Social Determinants of Health (SDOH) Interventions    Readmission Risk Interventions     No data to display

## 2021-12-22 NOTE — Progress Notes (Signed)
   12/22/21 1832  Assess: MEWS Score  BP (!) 243/116  MAP (mmHg) 149  Pulse Rate 69  Assess: MEWS Score  MEWS Temp 0  MEWS Systolic 2  MEWS Pulse 0  MEWS RR 1  MEWS LOC 0  MEWS Score 3  MEWS Score Color Yellow  Assess: if the MEWS score is Yellow or Red  Were vital signs taken at a resting state? Yes  Focused Assessment No change from prior assessment  Does the patient meet 2 or more of the SIRS criteria? No  Does the patient have a confirmed or suspected source of infection? No  MEWS guidelines implemented *See Row Information* No, previously yellow, continue vital signs every 4 hours  Treat  Pain Scale Faces  Pain Score 0  Notify: Charge Nurse/RN  Name of Charge Nurse/RN Notified Montey Hora, RN  Date Charge Nurse/RN Notified 12/22/21  Time Charge Nurse/RN Notified 1832  Notify: Provider  Provider Name/Title Dr. Reesa Chew  Date Provider Notified 12/22/21  Time Provider Notified 1832  Method of Notification  (message)  Notification Reason Other (Comment) (BP incresing)  Provider response See new orders  Date of Provider Response 12/22/21  Time of Provider Response 1835  Assess: SIRS CRITERIA  SIRS Temperature  0  SIRS Pulse 0  SIRS Respirations  0  SIRS WBC 1  SIRS Score Sum  1

## 2021-12-22 NOTE — Progress Notes (Signed)
Hd tx of 3.5hrs completed. 84L  total vol processed. No complications noted. Report given to Todd pinkerton, rn. Total uf removed: 1088ml Post hd weight: 69.6kg Post hd v/s: 161/104(114) 65 18 97.8 99%

## 2021-12-22 NOTE — Assessment & Plan Note (Signed)
Estimated body mass index is 21.08 kg/m as calculated from the following:   Height as of this encounter: 6' (1.829 m).   Weight as of this encounter: 70.5 kg.   Patient was found to have moderate degree malnourishment. -Dietitian consult

## 2021-12-22 NOTE — Progress Notes (Signed)
   12/22/21 1322  Assess: MEWS Score  BP (!) 208/90  MAP (mmHg) 118  Pulse Rate 71  Resp 10  SpO2 100 %  O2 Device Room Air  Patient Activity (if Appropriate) In bed  Assess: MEWS Score  MEWS Temp 0  MEWS Systolic 2  MEWS Pulse 0  MEWS RR 1  MEWS LOC 0  MEWS Score 3  MEWS Score Color Yellow  Assess: if the MEWS score is Yellow or Red  Were vital signs taken at a resting state? Yes  Focused Assessment Change from prior assessment (see assessment flowsheet)  Does the patient meet 2 or more of the SIRS criteria? No  Does the patient have a confirmed or suspected source of infection? No  MEWS guidelines implemented *See Row Information* Yes  Treat  MEWS Interventions Administered prn meds/treatments  Pain Scale Faces  Pain Score 0  Take Vital Signs  Increase Vital Sign Frequency  Yellow: Q 2hr X 2 then Q 4hr X 2, if remains yellow, continue Q 4hrs  Escalate  MEWS: Escalate Yellow: discuss with charge nurse/RN and consider discussing with provider and RRT  Notify: Charge Nurse/RN  Name of Charge Nurse/RN Notified Montey Hora, RN  Date Charge Nurse/RN Notified 12/22/21  Time Charge Nurse/RN Notified 1330  Notify: Provider  Provider Name/Title Dr. Reesa Chew  Date Provider Notified 12/22/21  Time Provider Notified 1340  Method of Notification  (message)  Notification Reason Other (Comment) (HTN this is on going)  Provider response Other (Comment) (gave PRN meds)  Date of Provider Response 12/22/21  Time of Provider Response 1350  Document  Patient Outcome Other (Comment)  Progress note created (see row info) Yes  Assess: SIRS CRITERIA  SIRS Temperature  0  SIRS Pulse 0  SIRS Respirations  0  SIRS WBC 1  SIRS Score Sum  1

## 2021-12-22 NOTE — Progress Notes (Signed)
Progress Note   Patient: Todd Brown [id number]:[id number] DOB: 1961-02-13 DOA: 12/15/2021     7 DOS: the patient was seen and examined on 12/22/2021   Brief hospital course: 61 year old male with past medical history of hypertension and end-stage renal disease on hemodialysis presented to the emergency room from his group home/skilled nursing facility on 7/17 with confusion.  (Patient's baseline is that he is unable to care for himself and has a legal guardian.)  Patient last received dialysis on Friday, 7/14.  From being in the emergency room on 7/17, unable to get dialysis yesterday.  Patient found to have markedly elevated blood pressure with a systolic in the 771H.  Patient with questionable cardiac ischemia as well.  CT scan of head and neurological exam unremarkable.  Patient was admitted to the hospitalist service and started on his oral antihypertensives, nicardipine drip and given IV labetalol and as needed IV hydralazine.  Nephrology consulted and plans to take patient for dialysis tomorrow.  Attempts to wean off Cardene have led to increasing high blood pressure, compounded by patient at first refusing to take oral medications.  Had become more physically agitated, wanting to be left alone.  Seen by psychiatry and started on scheduled and as needed Haldol.  Has become more compliant and better mentating and taking medications and blood pressure improving.  Also developed some periorbital swelling bilaterally felt to be secondary to subcutaneous emphysema and started on steroids, and periorbital swelling has since resolved.  Patient was supposed to be discharged back to his assisted living facility on 7/21 after dialysis.  His baseline is that he is able to sit in a chair for dialysis although some assistance.  And trying to get him out of bed and placed in a wheelchair, took 3 people and still patient unable to sit and stay in chair.  Discharge canceled and plan is for patient to have PT/OT evaluation  and possible skilled nursing prior to returning to assisted living.  7/24: PT and OT are recommending rehab.  TOC is working on it. Found to have some oral thrush.  Started on Magic mouthwash and prednisone was discontinued. Blood pressure remained elevated, neurology started on clonidine patch along with current multiple antihypertensives.  Had his dialysis today.   Assessment and Plan: * Acute metabolic encephalopathy Nursing left message for guardian.  Patient is unable to care for self.  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 Continue home Tegretol and Artane.  Appreciate psychiatry help.  Patient on scheduled and as needed Haldol which appears to have made a difference in his compliance.  Psychosis appears to be resolving.  I suspect some of this may be secondary to hypertensive encephalopathy although based on records, he likely has an underlying mental health disorder that has not been formally diagnosed.  Will discharge on as needed Haldol 5 mg p.o. twice daily as needed for behavioral agitation and refusal to take medicines.  Hypertensive emergency Patient was able to wean off of his nicardipine drip.  Suspect that what has happened is is that when he does not take his blood pressure medicines either by choice or if not given on dialysis days, he has such malignant hypertension that makes him acutely confused and agitated.  There may be an underlying undiagnosed mental health disorder.  The best plan for the patient will be to ensure that he gets his blood pressure medications even dialysis days.  He is to continue his Tegretol and  Artane.   Appreciate pharmacy help.  With their recommendations, will change losartan to irbesartan 150mg  to 300mg   daily which gives a significantly longer half life and better BP control throughout the day.  We will also start alpha blockers:doxazosin at 2mg  daily and offers room to titriate up to 16mg  daily.   Holding off on beta-blocker given heart rate in the 60s.  We will also consider in the future adding minoxidil starting at 5mg  daily and titrate to max of 40mg  daily. -Nephrology added clonidine patch along with p.o. clonidine.  ES[facility] on dialysis [facility]) Nephrology consulted, full dialysis session following admission and again today to keep him on his Monday Wednesday Friday schedule.  Next session scheduled for 7/24.  Mental health disorder Unspecified.  Patient is unable to care for himself.  It is unclear if he is ever had a formal diagnosis.  He is on Tegretol and Artane which he should continue.  As needed Haldol for acute behavior agitation as above.  Essential hypertension Resumed on his home p.o. amlodipine 10 mg daily, clonidine 0.3 mg p.o. 3 times daily, hydralazine 100 mg p.o. 3 times daily, isosorbide mononitrate 60 mg p.o. twice daily, losartan 100 mg daily.  Pressures at time improved, but then blood pressure starts to spike again.  As above, changing ARB and adding alpha-blocker. -Nephrology also added clonidine patch along with p.o. clonidine  Oral thrush Patient was found to have oral thrush today.  He was on high-dose steroid, which were discontinued. -Magic mouthwash.  Subcutaneous emphysema (HCC)-resolved as of 12/18/2021 On steroids.  Appreciate pulmonary assistance.  Subcutaneous emphysema looks to be resolved.  Changed from Solu-Medrol to p.o. prednisone quick taper.  Physical deconditioning PT OT are recommending SNF. TOC to work on placement  Unspecified protein-calorie malnutrition ([place]) Estimated body mass index is 21.08 kg/m as calculated from the following:   Height as of this encounter: 6' (1.829 m).   Weight as of this encounter: 70.5 kg.   Patient was found to have moderate degree malnourishment. -Dietitian consult  Subjective: Patient seems to be somnolent while getting dialysis during morning rounds.  Able to follow commands but trying to go back to  sleep pretty quickly.  Denies any complaints.  Physical Exam: Vitals:   12/22/21 1100 12/22/21 1130 12/22/21 1136 12/22/21 1322  BP: (!) 178/82 (!) 173/93 (!) 180/93 (!) 208/90  Pulse: 70 76 70 71  Resp: 12 10 (!) 9 10  Temp:   97.8 F (36.6 C) 97.6 F (36.4 C)  TempSrc:   Oral   SpO2: 98% 97% 97% 100%  Weight:   70.5 kg   Height:       General.  Somnolent and malnourished gentleman, in no acute distress. Pulmonary.  Lungs clear bilaterally, normal respiratory effort. CV.  Regular rate and rhythm, no JVD, rub or murmur. Abdomen.  Soft, nontender, nondistended, BS positive. CNS.  Somnolent, no apparent focal deficit Extremities.  No edema, no cyanosis, pulses intact and symmetrical.  Data Reviewed: Prior data reviewed  Family Communication: Unable to reach legal guardian on phone.  Disposition: Status is: Inpatient Remains inpatient appropriate because: Awaiting placement   Planned Discharge Destination: Skilled nursing facility  DVT prophylaxis.  Subcu heparin Time spent: 50 minutes  This record has been created using Systems analyst. Errors have been sought and corrected,but may not always be located. Such creation errors do not reflect on the standard of care.  Author: Lorella Nimrod, MD 12/22/2021 4:17 PM  For on call  review [email or web address].

## 2021-12-23 DIAGNOSIS — G9341 Metabolic encephalopathy: Secondary | ICD-10-CM | POA: Diagnosis not present

## 2021-12-23 MED ORDER — HALOPERIDOL LACTATE 5 MG/ML IJ SOLN
2.0000 mg | Freq: Every day | INTRAMUSCULAR | Status: DC | PRN
  Administered 2022-02-07: 2 mg via INTRAVENOUS
  Filled 2021-12-23: qty 1

## 2021-12-23 MED ORDER — PROSOURCE PLUS PO LIQD
30.0000 mL | Freq: Three times a day (TID) | ORAL | Status: DC
  Administered 2021-12-23 – 2022-02-22 (×144): 30 mL via ORAL

## 2021-12-23 MED ORDER — RENA-VITE PO TABS
1.0000 | ORAL_TABLET | Freq: Every day | ORAL | Status: DC
  Administered 2021-12-23 – 2022-02-21 (×60): 1 via ORAL
  Filled 2021-12-23 (×63): qty 1

## 2021-12-23 MED ORDER — MAGIC MOUTHWASH
10.0000 mL | Freq: Four times a day (QID) | ORAL | Status: DC
  Administered 2021-12-23 – 2022-01-09 (×59): 10 mL via ORAL
  Filled 2021-12-23 (×62): qty 10

## 2021-12-23 MED ORDER — NEPRO/CARBSTEADY PO LIQD
237.0000 mL | Freq: Three times a day (TID) | ORAL | Status: DC
  Administered 2021-12-23 – 2021-12-29 (×15): 237 mL via ORAL

## 2021-12-23 MED ORDER — DIPHENHYDRAMINE HCL 50 MG/ML IJ SOLN
50.0000 mg | Freq: Four times a day (QID) | INTRAMUSCULAR | Status: DC | PRN
Start: 2021-12-23 — End: 2022-02-22
  Administered 2021-12-29 – 2022-02-08 (×5): 50 mg via INTRAVENOUS
  Filled 2021-12-23 (×4): qty 1

## 2021-12-23 NOTE — Progress Notes (Signed)
Progress Note   Patient: Todd Brown [id number]:[id number] DOB: August 03, 1960 DOA: 12/15/2021     8 DOS: the patient was seen and examined on 12/23/2021   Brief hospital course: 61 year old male with past medical history of hypertension and end-stage renal disease on hemodialysis presented to the emergency room from his group home/skilled nursing facility on 7/17 with confusion.  (Patient's baseline is that he is unable to care for himself and has a legal guardian.)  Patient last received dialysis on Friday, 7/14.  From being in the emergency room on 7/17, unable to get dialysis yesterday.  Patient found to have markedly elevated blood pressure with a systolic in the 627O.  Patient with questionable cardiac ischemia as well.  CT scan of head and neurological exam unremarkable.  Patient was admitted to the hospitalist service and started on his oral antihypertensives, nicardipine drip and given IV labetalol and as needed IV hydralazine.  Nephrology consulted and plans to take patient for dialysis tomorrow.  Attempts to wean off Cardene have led to increasing high blood pressure, compounded by patient at first refusing to take oral medications.  Had become more physically agitated, wanting to be left alone.  Seen by psychiatry and started on scheduled and as needed Haldol.  Has become more compliant and better mentating and taking medications and blood pressure improving.  Also developed some periorbital swelling bilaterally felt to be secondary to subcutaneous emphysema and started on steroids, and periorbital swelling has since resolved.  Patient was supposed to be discharged back to his assisted living facility on 7/21 after dialysis.  His baseline is that he is able to sit in a chair for dialysis although some assistance.  And trying to get him out of bed and placed in a wheelchair, took 3 people and still patient unable to sit and stay in chair.  Discharge canceled and plan is for patient to have PT/OT evaluation  and possible skilled nursing prior to returning to assisted living.  7/24: PT and OT are recommending rehab.  TOC is working on it. Found to have some oral thrush.  Started on Magic mouthwash and prednisone was discontinued. Blood pressure remained elevated, neurology started on clonidine patch along with current multiple antihypertensives.  Had his dialysis today.  7/25: Patient was transferred to stepdown and started on nicardipine infusion due to persistently elevated blood pressure last night.  Cardene infusion was stopped at 9:30 AM when blood pressure improved to 350 systolic, have to restart as it started rising significantly despite getting all the scheduled antihypertensives. Remained quite somnolent.  Apparently was getting scheduled Benadryl which was made as needed.  Scheduled daily Haldol was also made as needed to see if that will affect his excessive sleep.   Assessment and Plan: * Acute metabolic encephalopathy Nursing left message for guardian.  Patient is unable to care for self.  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 Continue home Tegretol and Artane.  Appreciate psychiatry help.  Patient on scheduled and as needed Haldol which appears to have made a difference in his compliance.  Psychosis appears to be resolving.  I suspect some of this may be secondary to hypertensive encephalopathy although based on records, he likely has an underlying mental health disorder that has not been formally diagnosed.  Will discharge on as needed Haldol 5 mg p.o. twice daily as needed for behavioral agitation and refusal to take medicines. -Currently made all Haldol as needed and discontinue scheduled due to  increased somnolence  Hypertensive emergency Patient on multiple antihypertensives with very poor response. Again transferred to stepdown and started on nicardipine infusion with improvement in blood pressure, blood pressure started trending up  on stopping nicardipine again which was resumed. -Continue to monitor and nicardipine infusion.  ESRD on dialysis [facility]) Nephrology consulted, full dialysis session following admission and again today to keep him on his Monday Wednesday Friday schedule. -Continue scheduled dialysis  Mental health disorder Unspecified.  Patient is unable to care for himself.  It is unclear if he is ever had a formal diagnosis.  He is on Tegretol and Artane which he should continue.  As needed Haldol for acute behavior agitation as above.  Essential hypertension Resumed on his home p.o. amlodipine 10 mg daily, clonidine 0.3 mg p.o. 3 times daily, hydralazine 100 mg p.o. 3 times daily, isosorbide mononitrate 60 mg p.o. twice daily, losartan 100 mg daily.  Pressures at time improved, but then blood pressure starts to spike again.  As above, changing ARB and adding alpha-blocker. -Nephrology also added clonidine patch along with p.o. clonidine -Patient currently on nicardipine infusion again due to persistently elevated blood pressure above 200  Oral thrush Patient was found to have oral thrush today.  He was on high-dose steroid, which were discontinued. -Magic mouthwash.  Subcutaneous emphysema (HCC)-resolved as of 12/18/2021 On steroids.  Appreciate pulmonary assistance.  Subcutaneous emphysema looks to be resolved.  Changed from Solu-Medrol to p.o. prednisone quick taper.  Physical deconditioning PT OT are recommending SNF. TOC to work on placement  Unspecified protein-calorie malnutrition ([place]) Estimated body mass index is 21.08 kg/m as calculated from the following:   Height as of this encounter: 6' (1.829 m).   Weight as of this encounter: 70.5 kg.   Patient was found to have moderate degree malnourishment. -Dietitian consult     Subjective: Patient is again quite somnolent when seen today.  Easily arousable and following some simple commands.  Physical Exam: Vitals:   12/23/21 1100 12/23/21  1200 12/23/21 1300 12/23/21 1344  BP: 138/71 (!) 167/73 (!) 148/80 (!) 143/62  Pulse: (!) 57  63 61  Resp: 18 12 (!) 9 10  Temp:  98.3 F (36.8 C)    TempSrc:  Oral    SpO2: 93% 93% 95% 94%  Weight:      Height:       General.  Somnolent gentleman, in no acute distress. Pulmonary.  Lungs clear bilaterally, normal respiratory effort. CV.  Regular rate and rhythm, no JVD, rub or murmur. Abdomen.  Soft, nontender, nondistended, BS positive. CNS.  Somnolent, no apparent focal deficit Extremities.  No edema, no cyanosis, pulses intact and symmetrical. Psychiatry.  Judgment and insight appears impaired.  Data Reviewed: Prior data reviewed  Family Communication: Unable to reach legal guardian  Disposition: Status is: Inpatient Remains inpatient appropriate because: Severity of illness   Planned Discharge Destination: Skilled nursing facility  DVT prophylaxis.  Subcu heparin Time spent: 50 minutes  This record has been created using Systems analyst. Errors have been sought and corrected,but may not always be located. Such creation errors do not reflect on the standard of care.  Author: Lorella Nimrod, MD 12/23/2021 4:15 PM  For on call review [email or web address].

## 2021-12-23 NOTE — Progress Notes (Signed)
OT Cancellation Note  Patient Details Name: Todd Brown MRN: [id number] DOB: Oct 22, 1960   Cancelled Treatment:    Reason Eval/Treat Not Completed: Medical issues which prohibited therapy. Pt with recent transfer to ICU and will need continue at transfer orders for therapeutic intervention. Pt also placed on continuous nicardipine on 7/24 at 2213 and therapy will be held for 24 hours per therapy protocol. OT will re-attempt when pt is medically able to participate.   Darleen Crocker, [place], OTR/L , CBIS ascom [phone]  12/23/21, 8:55 AM

## 2021-12-23 NOTE — Assessment & Plan Note (Signed)
Patient on multiple antihypertensives with very poor response. Again transferred to stepdown and started on nicardipine infusion with improvement in blood pressure, blood pressure started trending up on stopping nicardipine again which was resumed. -Continue to monitor and nicardipine infusion.

## 2021-12-23 NOTE — Assessment & Plan Note (Signed)
Nephrology consulted, full dialysis session following admission and again today to keep him on his Monday Wednesday Friday schedule. -Continue scheduled dialysis

## 2021-12-23 NOTE — Assessment & Plan Note (Signed)
Unspecified.  Patient is unable to care for himself.  It is unclear if he is ever had a formal diagnosis.  He is on Tegretol and Artane which he should continue.  As needed Haldol for acute behavior agitation as above.

## 2021-12-23 NOTE — Progress Notes (Addendum)
[facility]  ROUNDING NOTE   Subjective:   Todd Brown is a 61 year old male with past medical history including hypertension, anemia, schizophrenia and end-stage renal disease on hemodialysis.  Patient has presented to the ED with altered mental status.  Patient has been admitted for Altered mental status [R41.82] Hypertensive urgency [I16.0] Severe hypertension [I10] Altered mental status, unspecified altered mental status type [R41.82]  Patient is known to our practice and receives outpatient dialysis treatments at [facility] on a MWF schedule.  Update  Patient seen and evaluated at bedside in ICU Remains somnolent  Opens eyes to name, unable to answer simple questions Room air No lower extremity edema  Objective:  Vital signs in last 24 hours:  Temp:  [97.5 F (36.4 C)-98.7 F (37.1 C)] 98.2 F (36.8 C) (07/25 0749) Pulse Rate:  [63-80] 63 (07/25 1000) Resp:  [7-21] 7 (07/25 1000) BP: (134-254)/(67-116) 160/73 (07/25 1000) SpO2:  [92 %-100 %] 97 % (07/25 1000) Weight:  [70.5 kg-71 kg] 71 kg (07/24 2130)  Weight change:  Filed Weights   12/22/21 0730 12/22/21 1136 12/22/21 2130  Weight: 70.6 kg 70.5 kg 71 kg    Intake/Output: I/O last 3 completed shifts: In: 421 [I.V.:421] Out: 1000 [Other:1000]   Intake/Output this shift:  Total I/O In: 576 [I.V.:576] Out: -   Physical Exam: General: Somnolent  Head: Normocephalic, atraumatic. Moist oral mucosal membranes  Eyes: Anicteric  Lungs:  Clear to auscultation, normal effort, room air  Heart: Regular rate and rhythm  Abdomen:  Soft, nontender, non distended  Extremities:  No peripheral edema.  Neurologic: Somnolent.  Skin: No lesions  Access: Lt AVF    Basic Metabolic Panel: Recent Labs  Lab 12/20/21 0942 12/22/21 0734  NA 132* 133*  K 4.0 4.3  CL 94* 94*  CO2 26 27  GLUCOSE 88 59*  BUN 34* 49*  CREATININE 4.52* 6.40*  CALCIUM 9.5 9.5  PHOS 3.4  --      Liver Function  Tests: Recent Labs  Lab 12/20/21 0942  ALBUMIN 3.5    No results for input(s): "LIPASE", "AMYLASE" in the last 168 hours. No results for input(s): "AMMONIA" in the last 168 hours.  CBC: Recent Labs  Lab 12/20/21 0942 12/22/21 0734  WBC 3.2* 4.4  NEUTROABS  --  3.0  HGB 10.9* 10.5*  HCT 31.4* 29.7*  MCV 94.6 93.4  PLT 171 155     Cardiac Enzymes: No results for input(s): "CKTOTAL", "CKMB", "CKMBINDEX", "TROPONINI" in the last 168 hours.  BNP: Invalid input(s): "POCBNP"  CBG: Recent Labs  Lab 12/17/21 1942 12/18/21 2116 12/22/21 2132  GLUCAP 69 112* 103*     Microbiology: Results for orders placed or performed during the hospital encounter of 12/15/21  SARS Coronavirus 2 by RT PCR (hospital order, performed in [facility] hospital lab) *cepheid single result test* Anterior Nasal Swab     Status: None   Collection Time: 12/15/21  5:50 PM   Specimen: Anterior Nasal Swab  Result Value Ref Range Status   SARS Coronavirus 2 by RT PCR NEGATIVE NEGATIVE Final    Comment: (NOTE) SARS-CoV-2 target nucleic acids are NOT DETECTED.  The SARS-CoV-2 RNA is generally detectable in upper and lower respiratory specimens during the acute phase of infection. The lowest concentration of SARS-CoV-2 viral copies this assay can detect is 250 copies / mL. A negative result does not preclude SARS-CoV-2 infection and should not be used as the sole basis for treatment or other patient management decisions.  A negative result may occur with improper specimen collection / handling, submission of specimen other than nasopharyngeal swab, presence of viral mutation(s) within the areas targeted by this assay, and inadequate number of viral copies (<250 copies / mL). A negative result must be combined with clinical observations, patient history, and epidemiological information.  Fact Sheet for Patients:   https://www.patel.info/  Fact Sheet for Healthcare  Providers: https://hall.com/  This test is not yet approved or  cleared by the [place] FDA and has been authorized for detection and/or diagnosis of SARS-CoV-2 by FDA under an Emergency Use Authorization (EUA).  This EUA will remain in effect (meaning this test can be used) for the duration of the COVID-19 declaration under Section 564(b)(1) of the Act, 21 U.S.C. section 360bbb-3(b)(1), unless the authorization is terminated or revoked sooner.  Performed at [facility], [address] [name], [place] [address]   MRSA Next Gen by PCR, Nasal     Status: None   Collection Time: 12/15/21  9:03 PM   Specimen: Nasal Mucosa; Nasal Swab  Result Value Ref Range Status   MRSA by PCR Next Gen NOT DETECTED NOT DETECTED Final    Comment: (NOTE) The GeneXpert MRSA Assay (FDA approved for NASAL specimens only), is one component of a comprehensive MRSA colonization surveillance program. It is not intended to diagnose MRSA infection nor to guide or monitor treatment for MRSA infections. Test performance is not FDA approved in patients less than 54 years old. Performed at [facility], [address] [place], [place] [address]     Coagulation Studies: No results for input(s): "LABPROT", "INR" in the last 72 hours.  Urinalysis: No results for input(s): "COLORURINE", "LABSPEC", "PHURINE", "GLUCOSEU", "HGBUR", "BILIRUBINUR", "KETONESUR", "PROTEINUR", "UROBILINOGEN", "NITRITE", "LEUKOCYTESUR" in the last 72 hours.  Invalid input(s): "APPERANCEUR"    Imaging: No results found.   Medications:    anticoagulant sodium citrate     niCARDipine Stopped (12/23/21 0925)    amLODipine  10 mg Oral q morning   calcium acetate  1,334 mg Oral TID WC   carbamazepine  1,000 mg Oral q AM   Chlorhexidine Gluconate Cloth  6 each Topical Q0600   cloNIDine  0.2 mg Transdermal Weekly   cloNIDine  0.3 mg Oral TID   diphenhydrAMINE  50 mg Intravenous Q6H    doxazosin  2 mg Oral QHS   ferrous sulfate  325 mg Oral Q breakfast   fluPHENAZine  2.5 mg Oral q AM   haloperidol lactate  2 mg Intravenous Daily   heparin  5,000 Units Subcutaneous Q8H   hydrALAZINE  100 mg Oral TID   irbesartan  300 mg Oral Daily   isosorbide mononitrate  60 mg Oral BID   magic mouthwash  10 mL Oral QID   mometasone-formoterol  2 puff Inhalation BID   pantoprazole  40 mg Oral Daily   trihexyphenidyl  2 mg Oral BID WC   albuterol, alteplase, anticoagulant sodium citrate, haloperidol lactate, heparin, hydrALAZINE, lidocaine (PF), lidocaine-prilocaine, ondansetron **OR** ondansetron (ZOFRAN) IV, mouth rinse, pentafluoroprop-tetrafluoroeth, polyethylene glycol, senna-docusate  Assessment/ Plan:  [name] is a 61 y.o.  male with past medical history including hypertension, anemia, schizophrenia and end-stage renal disease on hemodialysis.  Patient has presented to the ED with altered mental status.  Patient has been admitted for Altered mental status [R41.82] Hypertensive urgency [I16.0] Severe hypertension [I10] Altered mental status, unspecified altered mental status type [R41.82]  [facility]/MWF/Lt AVF  End-stage renal disease on hemodialysis.  Will maintain outpatient schedule if possible.    Dialysis received yesterday, UF 1 L achieved.  Next treatment scheduled for Wednesday.  2. Anemia of chronic kidney disease Lab Results  Component Value Date   HGB 10.5 (L) 12/22/2021    Mircera prescribed outpatient.    3. Secondary Hyperparathyroidism:  Lab Results  Component Value Date   CALCIUM 9.5 12/22/2021   PHOS 3.4 12/20/2021   We will continue to monitor bone minerals during this admission.  Continue calcium acetate with meals.    4.  Hypertension with chronic kidney disease.  Home regimen includes amlodipine, clonidine, hydralazine, isosorbide, and losartan.    Patient transferred back to ICU due to hypotension.  Nicardipine drip  reinitiated.  Blood pressure improved to 138/71 today.   LOS: 8 Shantelle Breeze 7/25/202311:26 AM   Patient was examined and evaluated with Colon Flattery, NP.  Plan of care was formulated and discussed with NP.  I agree with the note as documented.

## 2021-12-23 NOTE — Progress Notes (Signed)
Initial Nutrition Assessment  DOCUMENTATION CODES:   Non-severe (moderate) malnutrition in context of chronic illness  INTERVENTION:   -Nepro Shake po TID, each supplement provides 425 kcal and 19 grams protein  -30 ml Prosource Plus TID, each supplement provides 100 kcals and 15 grams protein -Renal MVI daily -Feeding assistance with meals -If feeding tube is warranted, recommend:  Initiate Nepro @ 20 ml/hr via NGT and increase by 10 ml every 8 hours to goal rate of 50 ml/hr.   45 ml Prosource TF daily.    20 ml free water flush every 4 hours  Tube feeding regimen provides 2200 kcal (100% of needs), 108 grams of protein, and 872 ml of H2O. Total free water: 992 ml daily  -If feedings are started, recommend monitor Mg, K, and Phos daily and replete as needed due to high refeeding risk   NUTRITION DIAGNOSIS:   Moderate Malnutrition related to chronic illness (ESRD on HD) as evidenced by mild fat depletion, mild muscle depletion, moderate muscle depletion.  GOAL:   Patient will meet greater than or equal to 90% of their needs  MONITOR:   PO intake, Supplement acceptance, Diet advancement  REASON FOR ASSESSMENT:   Malnutrition Screening Tool    ASSESSMENT:      Pt admitted with acute metabolic encephalopathy.   Reviewed I/O's: -579 ml x 24 hours and -239 ml since admission  Case discussed with RN, who reports pt's mental status waxes and wanes. Pt is up now, but was somnolent earlier today. RN able to administer medications in applesauce, but pt refused breakfast, as he did not want to eat after med pass.   Pt went in and out of sleep at time of visit. He reports that he is hungry and he slept well last night, however, did not answer additional questions. Intake has been variable. Noted meal completions 0-100%, averaging around 10-40% of meals.   Case discussed with RN and MD. Concern for ability to maintain adequate oral intake due to pt's mental status. Recommend  NGT placement for nutrition as well as SLP evaluation. MD wants SLP to evaluate first before given consent for NGT placement.   No weight history available to assess at this time. Unsure of pt's dry weight.   Medications reviewed and include phoslo and ferrous sulfate.   Labs reviewed: Na: 133, CBGS: 87-112 (inpatient orders for glycemic control are none).    NUTRITION - FOCUSED PHYSICAL EXAM:  Flowsheet Row Most Recent Value  Orbital Region No depletion  Upper Arm Region No depletion  Thoracic and Lumbar Region Moderate depletion  Buccal Region Mild depletion  Temple Region Mild depletion  Clavicle Bone Region No depletion  Clavicle and Acromion Bone Region No depletion  Scapular Bone Region No depletion  Dorsal Hand Mild depletion  Patellar Region Mild depletion  Anterior Thigh Region Mild depletion  Posterior Calf Region Mild depletion  Edema (RD Assessment) None  Hair Reviewed  Eyes Reviewed  Mouth Reviewed  Skin Reviewed  Nails Reviewed       Diet Order:   Diet Order             DIET DYS 3           DIET DYS 3 Room service appropriate? Yes; Fluid consistency: Thin; Fluid restriction: 1200 mL Fluid  Diet effective now                   EDUCATION NEEDS:   No education needs have been identified at this  time  Skin:  Skin Assessment: Reviewed RN Assessment  Last BM:  12/21/21  Height:   Ht Readings from Last 1 Encounters:  12/22/21 6' (1.829 m)    Weight:   Wt Readings from Last 1 Encounters:  12/22/21 71 kg    Ideal Body Weight:  80.9 kg  BMI:  Body mass index is 21.23 kg/m.  Estimated Nutritional Needs:   Kcal:  2100-2300  Protein:  105-120 grams  Fluid:  1000 ml +UOP    Loistine Chance, RD, LDN, [place] Registered Dietitian II Certified Diabetes Care and Education Specialist Please refer to [facility] for RD and/or RD on-call/weekend/after hours pager

## 2021-12-23 NOTE — Assessment & Plan Note (Signed)
Resumed on his home p.o. amlodipine 10 mg daily, clonidine 0.3 mg p.o. 3 times daily, hydralazine 100 mg p.o. 3 times daily, isosorbide mononitrate 60 mg p.o. twice daily, losartan 100 mg daily.  Pressures at time improved, but then blood pressure starts to spike again.  As above, changing ARB and adding alpha-blocker. -Nephrology also added clonidine patch along with p.o. clonidine -Patient currently on nicardipine infusion again due to persistently elevated blood pressure above 200

## 2021-12-23 NOTE — Assessment & Plan Note (Signed)
Nursing left message for guardian.  Patient is unable to care for self.  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 Continue home Tegretol and Artane.  Appreciate psychiatry help.  Patient on scheduled and as needed Haldol which appears to have made a difference in his compliance.  Psychosis appears to be resolving.  I suspect some of this may be secondary to hypertensive encephalopathy although based on records, he likely has an underlying mental health disorder that has not been formally diagnosed.  Will discharge on as needed Haldol 5 mg p.o. twice daily as needed for behavioral agitation and refusal to take medicines. -Currently made all Haldol as needed and discontinue scheduled due to increased somnolence

## 2021-12-24 DIAGNOSIS — E440 Moderate protein-calorie malnutrition: Secondary | ICD-10-CM | POA: Insufficient documentation

## 2021-12-24 DIAGNOSIS — G9341 Metabolic encephalopathy: Secondary | ICD-10-CM | POA: Diagnosis not present

## 2021-12-24 LAB — CBC WITH DIFFERENTIAL/PLATELET
Abs Immature Granulocytes: 0.02 10*3/uL (ref 0.00–0.07)
Basophils Absolute: 0 10*3/uL (ref 0.0–0.1)
Basophils Relative: 0 %
Eosinophils Absolute: 0.1 10*3/uL (ref 0.0–0.5)
Eosinophils Relative: 3 %
HCT: 29.4 % — ABNORMAL LOW (ref 39.0–52.0)
Hemoglobin: 10.1 g/dL — ABNORMAL LOW (ref 13.0–17.0)
Immature Granulocytes: 1 %
Lymphocytes Relative: 15 %
Lymphs Abs: 0.6 10*3/uL — ABNORMAL LOW (ref 0.7–4.0)
MCH: 32.7 pg (ref 26.0–34.0)
MCHC: 34.4 g/dL (ref 30.0–36.0)
MCV: 95.1 fL (ref 80.0–100.0)
Monocytes Absolute: 0.4 10*3/uL (ref 0.1–1.0)
Monocytes Relative: 10 %
Neutro Abs: 2.9 10*3/uL (ref 1.7–7.7)
Neutrophils Relative %: 71 %
Platelets: 147 10*3/uL — ABNORMAL LOW (ref 150–400)
RBC: 3.09 MIL/uL — ABNORMAL LOW (ref 4.22–5.81)
RDW: 13.7 % (ref 11.5–15.5)
WBC: 4.1 10*3/uL (ref 4.0–10.5)
nRBC: 0 % (ref 0.0–0.2)

## 2021-12-24 LAB — RENAL FUNCTION PANEL
Albumin: 3.3 g/dL — ABNORMAL LOW (ref 3.5–5.0)
Anion gap: 10 (ref 5–15)
BUN: 38 mg/dL — ABNORMAL HIGH (ref 8–23)
CO2: 27 mmol/L (ref 22–32)
Calcium: 9.2 mg/dL (ref 8.9–10.3)
Chloride: 96 mmol/L — ABNORMAL LOW (ref 98–111)
Creatinine, Ser: 5.6 mg/dL — ABNORMAL HIGH (ref 0.61–1.24)
GFR, Estimated: 11 mL/min — ABNORMAL LOW (ref >60)
Glucose, Bld: 90 mg/dL (ref 70–99)
Phosphorus: 2.5 mg/dL (ref 2.5–4.6)
Potassium: 4.1 mmol/L (ref 3.5–5.1)
Sodium: 133 mmol/L — ABNORMAL LOW (ref 135–145)

## 2021-12-24 MED ORDER — DOXAZOSIN MESYLATE 4 MG PO TABS
4.0000 mg | ORAL_TABLET | Freq: Every day | ORAL | Status: DC
  Administered 2021-12-24 – 2022-02-21 (×55): 4 mg via ORAL
  Filled 2021-12-24 (×62): qty 1

## 2021-12-24 MED ORDER — MINOXIDIL 2.5 MG PO TABS
5.0000 mg | ORAL_TABLET | Freq: Every day | ORAL | Status: DC
  Administered 2021-12-24: 5 mg via ORAL
  Filled 2021-12-24 (×2): qty 2

## 2021-12-24 MED ORDER — CHLORHEXIDINE GLUCONATE CLOTH 2 % EX PADS
6.0000 | MEDICATED_PAD | Freq: Every day | CUTANEOUS | Status: DC
  Administered 2021-12-24 – 2022-02-19 (×56): 6 via TOPICAL

## 2021-12-24 MED ORDER — ACETAMINOPHEN 325 MG PO TABS
650.0000 mg | ORAL_TABLET | Freq: Once | ORAL | Status: AC
  Administered 2021-12-24: 650 mg via ORAL
  Filled 2021-12-24: qty 2

## 2021-12-24 MED ORDER — POTASSIUM CHLORIDE 20 MEQ PO PACK: 40.0000 meq | PACK | Freq: Once | ORAL | Status: DC

## 2021-12-24 NOTE — Plan of Care (Signed)
  Problem: Education: Goal: Knowledge of General Education information will improve Description: Including pain rating scale, medication(s)/side effects and non-pharmacologic comfort measures Outcome: Not Progressing   Problem: Health Behavior/Discharge Planning: Goal: Ability to manage health-related needs will improve Outcome: Progressing   Problem: Clinical Measurements: Goal: Ability to maintain clinical measurements within normal limits will improve Outcome: Progressing Goal: Respiratory complications will improve Outcome: Progressing   Problem: Clinical Measurements: Goal: Respiratory complications will improve Outcome: Progressing   Problem: Activity: Goal: Risk for activity intolerance will decrease Outcome: Progressing

## 2021-12-24 NOTE — Progress Notes (Signed)
PT Cancellation Note  Patient Details Name: Todd Brown MRN: [id number] DOB: Mar 24, 1961   Cancelled Treatment:    Reason Eval/Treat Not Completed: Other (comment). Several attempts made to re-eval this patient limited by HD and elevated BP. Entered room with 2 BPs taken with systolic >003. Not safe to perform OOB mobility. RN reports meds given for BP and will readdress. Will re-attempt next date.   Vinette Crites 12/24/2021, 2:39 PM [name], PT, DPT, GCS [phone]

## 2021-12-24 NOTE — Assessment & Plan Note (Signed)
Waxing and weaning.  Suspecting related to elevated blood pressure. Increased somnolence seems improving after stopping scheduled Benadryl and Haldol.  Blood pressure still a challenge to control. -Continue to monitor

## 2021-12-24 NOTE — Evaluation (Signed)
Clinical/Bedside Swallow Evaluation Patient Details  Name: Trashaun Streight MRN: [id number] Date of Birth: Dec 14, 1960  Today's Date: 12/24/2021 Time: SLP Start Time (ACUTE ONLY): 69 SLP Stop Time (ACUTE ONLY): 1455 SLP Time Calculation (min) (ACUTE ONLY): 50 min  Past Medical History:  Past Medical History:  Diagnosis Date   ESRD (end stage renal disease) on dialysis ([place])    Hypertension    Past Surgical History:  Past Surgical History:  Procedure Laterality Date   AV FISTULA PLACEMENT     HPI:  Pt is a 61 year old male with past medical history of hypertension and end-stage renal disease on hemodialysis presented to the emergency room from his Group home/skilled nursing facility on 7/17 with confusion.  Unsure of pt's Baseline Cognitive status.  (Patient's baseline is that he is unable to care for himself and has a legal guardian.)  Patient last received dialysis on Friday, 7/14.  From being in the emergency room on 7/17, unable to get dialysis yesterday.  Patient found to have markedly elevated blood pressure with a systolic in the 409W.  Patient with questionable cardiac ischemia as well.  CT scan of head and neurological exam unremarkable.  Patient was admitted to the hospitalist service and started on his oral antihypertensives, nicardipine drip and given IV labetalol and as needed IV hydralazine.  Nephrology consulted and plans to take patient for dialysis tomorrow.     Attempts to wean off Cardene have led to increasing high blood pressure, compounded by patient at first refusing to take oral medications.  Had become more physically agitated, wanting to be left alone.  Seen by psychiatry and started on scheduled and as needed Haldol.  Has become more compliant and better mentating and taking medications and blood pressure improving.  Also developed some periorbital swelling bilaterally felt to be secondary to subcutaneous emphysema and started on steroids, and periorbital swelling has  since resolved.  Patient was supposed to be discharged back to his assisted living facility on 7/21 after dialysis.  His baseline is that he is able to sit in a chair for dialysis although some assistance.  And trying to get him out of bed and placed in a wheelchair, took 3 people and still patient unable to sit and stay in chair.  Discharge canceled and plan is for patient to have PT/OT evaluation and possible skilled nursing facility at discharge.  Of Note: pt has been less drowsy today after MD changed scheduled Haldol and Benadryl to as needed.  Psychiatry is following pt.    Assessment / Plan / Recommendation  Clinical Impression   Pt seen for BSE today. Pt recently finished w/ his in-room dialysis. He awakened w/ verbal stim; not overly drowsy. He required full positioning assistance and feeding assistance w/ late Lunch meal -- NSG reported same at breakfast meal this morning. Verbal to answer direct Y/N questions; he did not follow commands consistently. Unsure of pt's Baseline Cognitive status based on his presentation and history.  On RA, afebrile, WBC wnl.   Pt appears to present w/ oral phase dysphagia in setting of declined Cognitive presentation/status. ANY Cognitive decline can impact overall oral awareness, timing of swallow, and safety during po's which increases risk for aspiration, choking. Pt's risk for aspiration can be reduced when following general aspiration precautions, when given Supervision and feeding assistance at meals, and when using a modified diet consistency of broken-down, moistened foods d/t Edentulous status at baseline. He required min-mod verbal/visual/tactile cues for follow through during po tasks at session.  Pt consumed several trials of ice chips, purees, Minced solids and thin liquids via straw w/ No overt clinical s/s of aspiration noted: no decline in vocal quality; no cough, and no decline in respiratory status during/post trials. O2 sats remained 96-97%  during session.  HOWEVER, oral phase was c/b lengthy oral phase gumming/mashing and lingual rolling/chewing behaviors -- increased gumming behaviors during/post po's in mouth. The "chewing behavior" was noted when pt was given oral stim even PRIOR TO po's and after sipping liquids. Suspect this oral phase behavior is directly related to his Cognitive decline presentation.  After lengthy oral phase time of chewing behavior, pt exhibited A-P transfer and oral clearing though diffuse bolus residue remained w/ more particulate foods; not noted w/ puree consistency foods. Alternated foods and liquids to aid oral clearing b/t bites; checking for oral clearing was necessary during Lunch meal. OM Exam was cursory d/t Cognition and poor follow through w/ tasks but appeared grossly [facility] w/ No unilateral oral/lingual weakness noted. Pt does exhibit s/s of Thrush -- currently being tx'd per NSG. Confusion(apraxic-like behaviors) noted w/ OM tasks and oral care despite cues. Pt required full feeding assistance. NSG reported same this AM.    \    In setting of uncertainty of pt's Baseline Cognitive function/status -- appears declined, Edentulous status, and dependency for feeding, risk for aspiration is present. Recommend continue the dysphagia level 3(w/ MINCED MEATS) and gravies to moisten for ease of oral phase gumming; thin liquids. Recommend general aspiration precautions; Supervision and Feeding assistance at all meals/po's; reduce Distractions during meals and engage pt during meals. CHECK FOR ORAL CLEARING B/T BITES AND AT END OF MEALS. Pills Crushed in Puree for safer swallowing as needed. MD/NSG updated.  ST services recommends follow w/ Neurology re: any Cognitive decline as it can direct impact on swallowing safety. Precautions posted in room. No further skilled ST services indicated - f/u for education at next venue of care can be had if indicated.  SLP Visit Diagnosis: Dysphagia, oral phase (R13.11) (unsure of  pt's Baseline Cognitive function/status -- appears declined; Edentulous; dependent for feeding)    Aspiration Risk  Mild aspiration risk;Risk for inadequate nutrition/hydration (reduced when following general aspiration precautions)    Diet Recommendation   dysphagia level 3(w/ MINCED MEATS) and gravies to moisten for ease of oral phase gumming; thin liquids. Recommend general aspiration precautions; Supervision and Feeding assistance at all meals/po's; reduce Distractions during meals and engage pt during meals. CHECK FOR ORAL CLEARING B/T BITES AND AT END OF MEALS.  Medication Administration: Crushed with puree (for safer swallowing)    Other  Recommendations Recommended Consults:  (Dietician) Oral Care Recommendations: Oral care BID;Oral care before and after PO;Staff/trained caregiver to provide oral care Other Recommendations:  (n/a)    Recommendations for follow up therapy are one component of a multi-disciplinary discharge planning process, led by the attending physician.  Recommendations may be updated based on patient status, additional functional criteria and insurance authorization.  Follow up Recommendations Follow physician's recommendations for discharge plan and follow up therapies      Assistance Recommended at Discharge Frequent or constant Supervision/Assistance (feeding support)  Functional Status Assessment Patient has had a recent decline in their functional status and/or demonstrates limited ability to make significant improvements in function in a reasonable and predictable amount of time  Frequency and Duration  (n/a)   (n/a)       Prognosis Prognosis for Safe Diet Advancement: Fair Barriers to Reach Goals: Cognitive deficits;Language deficits;Time  post onset;Severity of deficits;Behavior Barriers/Prognosis Comment: unsure of pt's Baseline Cognitive function/status -- appears declined; Edentulous; dependent for feeding      Swallow Study   General Date of  Onset: 12/15/21 HPI: Pt is a 61 year old male with past medical history of hypertension and end-stage renal disease on hemodialysis presented to the emergency room from his Group home/skilled nursing facility on 7/17 with confusion.  Unsure of pt's Baseline Cognitive status.  (Patient's baseline is that he is unable to care for himself and has a legal guardian.)  Patient last received dialysis on Friday, 7/14.  From being in the emergency room on 7/17, unable to get dialysis yesterday.  Patient found to have markedly elevated blood pressure with a systolic in the 664Q.  Patient with questionable cardiac ischemia as well.  CT scan of head and neurological exam unremarkable.  Patient was admitted to the hospitalist service and started on his oral antihypertensives, nicardipine drip and given IV labetalol and as needed IV hydralazine.  Nephrology consulted and plans to take patient for dialysis tomorrow.     Attempts to wean off Cardene have led to increasing high blood pressure, compounded by patient at first refusing to take oral medications.  Had become more physically agitated, wanting to be left alone.  Seen by psychiatry and started on scheduled and as needed Haldol.  Has become more compliant and better mentating and taking medications and blood pressure improving.  Also developed some periorbital swelling bilaterally felt to be secondary to subcutaneous emphysema and started on steroids, and periorbital swelling has since resolved.  Patient was supposed to be discharged back to his assisted living facility on 7/21 after dialysis.  His baseline is that he is able to sit in a chair for dialysis although some assistance.  And trying to get him out of bed and placed in a wheelchair, took 3 people and still patient unable to sit and stay in chair.  Discharge canceled and plan is for patient to have PT/OT evaluation and possible skilled nursing facility at discharge.  Of Note: pt has been less drowsy today after MD  changed scheduled Haldol and Benadryl to as needed.  Psychiatry is following pt. Type of Study: Bedside Swallow Evaluation Previous Swallow Assessment: none Diet Prior to this Study: Dysphagia 3 (soft);Thin liquids Temperature Spikes Noted: No (wbc 4.1) Respiratory Status: Room air History of Recent Intubation: No Behavior/Cognition: Alert;Cooperative;Pleasant mood;Confused;Distractible;Requires cueing (unsure of pt's baseline Cognitive status) Oral Cavity Assessment:  (oral thrush documented w/ ongoing tx) Oral Care Completed by SLP: Yes Oral Cavity - Dentition: Edentulous Vision:  (n/a) Self-Feeding Abilities: Total assist (does not feed himself it appears) Patient Positioning: Upright in bed (total positioning support) Baseline Vocal Quality: Normal Volitional Cough: Cognitively unable to elicit Volitional Swallow: Unable to elicit    Oral/Motor/Sensory Function Overall Oral Motor/Sensory Function: Within functional limits (some oral apraxic-like behavior given stim; no unilateral weakness noted)   Ice Chips Ice chips: Within functional limits Presentation: Spoon (fed; 2 trials)   Thin Liquid Thin Liquid: Within functional limits Presentation: Straw (fed; ~6 ozs) Other Comments: water, tea, milk    Nectar Thick Nectar Thick Liquid: Not tested   Honey Thick Honey Thick Liquid: Not tested   Puree Puree: Within functional limits Presentation: Spoon (fed; 4ozs)   Solid     Solid: Impaired Presentation: Spoon (fed; 10+ trials) Oral Phase Impairments: Poor awareness of bolus;Reduced lingual movement/coordination (increased gumming/lingual movements at rest and w/ stim of po's) Oral Phase Functional Implications: Prolonged oral  transit;Oral residue;Impaired mastication Pharyngeal Phase Impairments:  (none) Other Comments: alternated foods/liquids to aid oral clearing         Orinda Kenner, MS, CCC-SLP Speech Language Pathologist Rehab Services; [place] [phone] (ascom) Fenton Candee 12/24/2021,5:11 PM

## 2021-12-24 NOTE — Assessment & Plan Note (Signed)
Patient on multiple antihypertensives with very poor response. Again transferred to stepdown and started on nicardipine infusion. Blood pressure trending up after weaning nicardipine. Also started on minoxidil. -Continue to monitor and nicardipine infusion.

## 2021-12-24 NOTE — Progress Notes (Signed)
[facility]  ROUNDING NOTE   Subjective:   Todd Brown is a 61 year old male with past medical history including hypertension, anemia, schizophrenia and end-stage renal disease on hemodialysis.  Patient has presented to the ED with altered mental status.  Patient has been admitted for Altered mental status [R41.82] Hypertensive urgency [I16.0] Severe hypertension [I10] Altered mental status, unspecified altered mental status type [R41.82]  Patient is known to our practice and receives outpatient dialysis treatments at [facility] on a MWF schedule.  Update  Patient seen and evaluated at bedside in ICU Alert and responsive to simple questioning Tolerating meals without nausea and vomiting Remains on room air No lower extremity edema  Dialysis team at bedside preparing for treatment  Objective:  Vital signs in last 24 hours:  Temp:  [98 F (36.7 C)-98.4 F (36.9 C)] 98 F (36.7 C) (07/26 0815) Pulse Rate:  [57-78] 73 (07/26 0945) Resp:  [9-20] 10 (07/26 0945) BP: (124-207)/(62-139) 172/85 (07/26 0945) SpO2:  [91 %-100 %] 93 % (07/26 0945) Weight:  [72.5 kg] 72.5 kg (07/26 0815)  Weight change:  Filed Weights   12/22/21 1136 12/22/21 2130 12/24/21 0815  Weight: 70.5 kg 71 kg 72.5 kg    Intake/Output: I/O last 3 completed shifts: In: 1588.2 [I.V.:1588.2] Out: -    Intake/Output this shift:  Total I/O In: 117.9 [I.V.:117.9] Out: -   Physical Exam: General: NAD  Head: Normocephalic, atraumatic. Moist oral mucosal membranes  Eyes: Anicteric  Lungs:  Clear to auscultation, normal effort, room air  Heart: Regular rate and rhythm  Abdomen:  Soft, nontender, non distended  Extremities:  No peripheral edema.  Neurologic: Alert, answer simple questions  Skin: No lesions  Access: Lt AVF    Basic Metabolic Panel: Recent Labs  Lab 12/20/21 0942 12/22/21 0734 12/24/21 0700  NA 132* 133* 133*  K 4.0 4.3 4.1  CL 94* 94* 96*  CO2 26 27 27    GLUCOSE 88 59* 90  BUN 34* 49* 38*  CREATININE 4.52* 6.40* 5.60*  CALCIUM 9.5 9.5 9.2  PHOS 3.4  --  2.5     Liver Function Tests: Recent Labs  Lab 12/20/21 0942 12/24/21 0700  ALBUMIN 3.5 3.3*    No results for input(s): "LIPASE", "AMYLASE" in the last 168 hours. No results for input(s): "AMMONIA" in the last 168 hours.  CBC: Recent Labs  Lab 12/20/21 0942 12/22/21 0734 12/24/21 0700  WBC 3.2* 4.4 4.1  NEUTROABS  --  3.0 2.9  HGB 10.9* 10.5* 10.1*  HCT 31.4* 29.7* 29.4*  MCV 94.6 93.4 95.1  PLT 171 155 147*     Cardiac Enzymes: No results for input(s): "CKTOTAL", "CKMB", "CKMBINDEX", "TROPONINI" in the last 168 hours.  BNP: Invalid input(s): "POCBNP"  CBG: Recent Labs  Lab 12/17/21 1942 12/18/21 2116 12/22/21 2132  GLUCAP 56 112* 103*     Microbiology: Results for orders placed or performed during the hospital encounter of 12/15/21  SARS Coronavirus 2 by RT PCR (hospital order, performed in [facility] hospital lab) *cepheid single result test* Anterior Nasal Swab     Status: None   Collection Time: 12/15/21  5:50 PM   Specimen: Anterior Nasal Swab  Result Value Ref Range Status   SARS Coronavirus 2 by RT PCR NEGATIVE NEGATIVE Final    Comment: (NOTE) SARS-CoV-2 target nucleic acids are NOT DETECTED.  The SARS-CoV-2 RNA is generally detectable in upper and lower respiratory specimens during the acute phase of infection. The lowest concentration of SARS-CoV-2 viral  copies this assay can detect is 250 copies / mL. A negative result does not preclude SARS-CoV-2 infection and should not be used as the sole basis for treatment or other patient management decisions.  A negative result may occur with improper specimen collection / handling, submission of specimen other than nasopharyngeal swab, presence of viral mutation(s) within the areas targeted by this assay, and inadequate number of viral copies (<250 copies / mL). A negative result must be  combined with clinical observations, patient history, and epidemiological information.  Fact Sheet for Patients:   https://www.patel.info/  Fact Sheet for Healthcare Providers: https://hall.com/  This test is not yet approved or  cleared by the [place] FDA and has been authorized for detection and/or diagnosis of SARS-CoV-2 by FDA under an Emergency Use Authorization (EUA).  This EUA will remain in effect (meaning this test can be used) for the duration of the COVID-19 declaration under Section 564(b)(1) of the Act, 21 U.S.C. section 360bbb-3(b)(1), unless the authorization is terminated or revoked sooner.  Performed at [facility], [address] [place], [place] [address]   MRSA Next Gen by PCR, Nasal     Status: None   Collection Time: 12/15/21  9:03 PM   Specimen: Nasal Mucosa; Nasal Swab  Result Value Ref Range Status   MRSA by PCR Next Gen NOT DETECTED NOT DETECTED Final    Comment: (NOTE) The GeneXpert MRSA Assay (FDA approved for NASAL specimens only), is one component of a comprehensive MRSA colonization surveillance program. It is not intended to diagnose MRSA infection nor to guide or monitor treatment for MRSA infections. Test performance is not FDA approved in patients less than 75 years old. Performed at [facility], [address] [place], [place] [address]     Coagulation Studies: No results for input(s): "LABPROT", "INR" in the last 72 hours.  Urinalysis: No results for input(s): "COLORURINE", "LABSPEC", "PHURINE", "GLUCOSEU", "HGBUR", "BILIRUBINUR", "KETONESUR", "PROTEINUR", "UROBILINOGEN", "NITRITE", "LEUKOCYTESUR" in the last 72 hours.  Invalid input(s): "APPERANCEUR"    Imaging: No results found.   Medications:    anticoagulant sodium citrate     niCARDipine 3 mg/hr (12/24/21 0917)    (feeding supplement) PROSource Plus  30 mL Oral TID BM   amLODipine  10 mg Oral q  morning   calcium acetate  1,334 mg Oral TID WC   carbamazepine  1,000 mg Oral q AM   Chlorhexidine Gluconate Cloth  6 each Topical Daily   cloNIDine  0.2 mg Transdermal Weekly   cloNIDine  0.3 mg Oral TID   doxazosin  2 mg Oral QHS   feeding supplement (NEPRO CARB STEADY)  237 mL Oral TID BM   ferrous sulfate  325 mg Oral Q breakfast   fluPHENAZine  2.5 mg Oral q AM   heparin  5,000 Units Subcutaneous Q8H   hydrALAZINE  100 mg Oral TID   irbesartan  300 mg Oral Daily   isosorbide mononitrate  60 mg Oral BID   magic mouthwash  10 mL Oral QID   mometasone-formoterol  2 puff Inhalation BID   multivitamin  1 tablet Oral QHS   pantoprazole  40 mg Oral Daily   trihexyphenidyl  2 mg Oral BID WC   albuterol, alteplase, anticoagulant sodium citrate, diphenhydrAMINE, haloperidol lactate, heparin, hydrALAZINE, lidocaine (PF), lidocaine-prilocaine, ondansetron **OR** ondansetron (ZOFRAN) IV, mouth rinse, pentafluoroprop-tetrafluoroeth, polyethylene glycol, senna-docusate  Assessment/ Plan:  [name] is a 61 y.o.  male with past medical history including hypertension, anemia, schizophrenia  and end-stage renal disease on hemodialysis.  Patient has presented to the ED with altered mental status.  Patient has been admitted for Altered mental status [R41.82] Hypertensive urgency [I16.0] Severe hypertension [I10] Altered mental status, unspecified altered mental status type [R41.82]  [facility] [place]/MWF/Lt AVF  End-stage renal disease on hemodialysis.  Will maintain outpatient schedule if possible.    Will receive scheduled dialysis today, UF goal 0.5-1L as tolerated. Next treatment scheduled for Friday  2. Anemia of chronic kidney disease Lab Results  Component Value Date   HGB 10.1 (L) 12/24/2021    Hgb at goal. Mircera prescribed outpatient.    3. Secondary Hyperparathyroidism:  Lab Results  Component Value Date   CALCIUM 9.2 12/24/2021   PHOS 2.5 12/24/2021   Calcium  and phosphorus within acceptable range Continue calcium acetate with meals.    4.  Hypertension with chronic kidney disease.  Home regimen includes amlodipine, clonidine, hydralazine, isosorbide, and losartan.    Patient remains in ICU and has been weaned off nicardipine drip.  Blood pressure currently 172/85   LOS: 9   7/26/20239:59 AM   Patient was examined and evaluated with Colon Flattery, NP.  Plan of care was formulated and discussed with NP.  I agree with the note as documented.

## 2021-12-24 NOTE — Assessment & Plan Note (Signed)
Nephrology consulted, full dialysis session following admission and again today to keep him on his Monday Wednesday Friday schedule. -Continue scheduled dialysis

## 2021-12-24 NOTE — Assessment & Plan Note (Signed)
Resumed on his home p.o. amlodipine 10 mg daily, clonidine 0.3 mg p.o. 3 times daily, hydralazine 100 mg p.o. 3 times daily, isosorbide mononitrate 60 mg p.o. twice daily, losartan 100 mg daily.  Pressures at time improved, but then blood pressure starts to spike again.  As above, changing ARB and adding alpha-blocker. -Nephrology also added clonidine patch along with p.o. clonidine -Minoxidil 5 mg was added today and we will titrate accordingly -Patient currently on nicardipine infusion again due to persistently elevated blood pressure above 200

## 2021-12-24 NOTE — Progress Notes (Signed)
OT Cancellation Note  Patient Details Name: Todd Brown MRN: [id number] DOB: 1960-12-27   Cancelled Treatment:    Reason Eval/Treat Not Completed: Other (comment) (attempted to see pt this morning, he was recieving HD; Attempted to see pt this afternoon and BP with systolics in the 210Z. RN reports providing meds to address. OT will reattempt when pt is medically ready.) Shanon Payor, OTD OTR/L  12/24/21, 2:31 PM

## 2021-12-24 NOTE — Progress Notes (Signed)
Progress Note   Patient: Todd Brown NWG:[id number] DOB: March 15, 1961 DOA: 12/15/2021     9 DOS: the patient was seen and examined on 12/24/2021   Brief hospital course: 61 year old male with past medical history of hypertension and end-stage renal disease on hemodialysis presented to the emergency room from his group home/skilled nursing facility on 7/17 with confusion.  (Patient's baseline is that he is unable to care for himself and has a legal guardian.)  Patient last received dialysis on Friday, 7/14.  From being in the emergency room on 7/17, unable to get dialysis yesterday.  Patient found to have markedly elevated blood pressure with a systolic in the 578I.  Patient with questionable cardiac ischemia as well.  CT scan of head and neurological exam unremarkable.  Patient was admitted to the hospitalist service and started on his oral antihypertensives, nicardipine drip and given IV labetalol and as needed IV hydralazine.  Nephrology consulted and plans to take patient for dialysis tomorrow.  Attempts to wean off Cardene have led to increasing high blood pressure, compounded by patient at first refusing to take oral medications.  Had become more physically agitated, wanting to be left alone.  Seen by psychiatry and started on scheduled and as needed Haldol.  Has become more compliant and better mentating and taking medications and blood pressure improving.  Also developed some periorbital swelling bilaterally felt to be secondary to subcutaneous emphysema and started on steroids, and periorbital swelling has since resolved.  Patient was supposed to be discharged back to his assisted living facility on 7/21 after dialysis.  His baseline is that he is able to sit in a chair for dialysis although some assistance.  And trying to get him out of bed and placed in a wheelchair, took 3 people and still patient unable to sit and stay in chair.  Discharge canceled and plan is for patient to have PT/OT evaluation  and possible skilled nursing prior to returning to assisted living.  7/24: PT and OT are recommending rehab.  TOC is working on it. Found to have some oral thrush.  Started on Magic mouthwash and prednisone was discontinued. Blood pressure remained elevated, neurology started on clonidine patch along with current multiple antihypertensives.  Had his dialysis today.  7/25: Patient was transferred to stepdown and started on nicardipine infusion due to persistently elevated blood pressure last night.  Cardene infusion was stopped at 9:30 AM when blood pressure improved to 696 systolic, have to restart as it started rising significantly despite getting all the scheduled antihypertensives. Remained quite somnolent.  Apparently was getting scheduled Benadryl which was made as needed.  Scheduled daily Haldol was also made as needed to see if that will affect his excessive sleep.  7/26: Blood pressure dropped during dialysis and nicardipine drip was discontinued.  Later started trending up.  He was also started on minoxidil at 5 mg daily and dose will be titrated. Little more alert and less somnolence after stopping scheduled Benadryl and Haldol yesterday.   Assessment and Plan: * Acute metabolic encephalopathy Waxing and weaning.  Suspecting related to elevated blood pressure. Increased somnolence seems improving after stopping scheduled Benadryl and Haldol.  Blood pressure still a challenge to control. -Continue to monitor  Hypertensive emergency Patient on multiple antihypertensives with very poor response. Again transferred to stepdown and started on nicardipine infusion. Blood pressure trending up after weaning nicardipine. Also started on minoxidil. -Continue to monitor and nicardipine infusion.  ESRD on dialysis [facility]) Nephrology consulted, full dialysis session following  admission and again today to keep him on his Monday Wednesday Friday schedule. -Continue scheduled dialysis  Mental  health disorder Unspecified.  Patient is unable to care for himself.  It is unclear if he is ever had a formal diagnosis.  He is on Tegretol and Artane which he should continue.  As needed Haldol for acute behavior agitation as above.  Essential hypertension Resumed on his home p.o. amlodipine 10 mg daily, clonidine 0.3 mg p.o. 3 times daily, hydralazine 100 mg p.o. 3 times daily, isosorbide mononitrate 60 mg p.o. twice daily, losartan 100 mg daily.  Pressures at time improved, but then blood pressure starts to spike again.  As above, changing ARB and adding alpha-blocker. -Nephrology also added clonidine patch along with p.o. clonidine -Minoxidil 5 mg was added today and we will titrate accordingly -Patient currently on nicardipine infusion again due to persistently elevated blood pressure above 200  Oral thrush Patient was found to have oral thrush today.  He was on high-dose steroid, which were discontinued. -Magic mouthwash.  Subcutaneous emphysema (HCC)-resolved as of 12/18/2021 On steroids.  Appreciate pulmonary assistance.  Subcutaneous emphysema looks to be resolved.  Changed from Solu-Medrol to p.o. prednisone quick taper.  Physical deconditioning PT OT are recommending SNF. TOC to work on placement  Unspecified protein-calorie malnutrition ([place]) Estimated body mass index is 21.08 kg/m as calculated from the following:   Height as of this encounter: 6' (1.829 m).   Weight as of this encounter: 70.5 kg.   Patient was found to have moderate degree malnourishment. -Dietitian consult     Subjective: Patient was again sleeping and getting his dialysis when seen today.  Blood pressure dropped a little bit during the start of dialysis and nicardipine drip was hold. Per nursing staff patient was more alert and interactive earlier and ate all of his breakfast.  Physical Exam: Vitals:   12/24/21 1200 12/24/21 1215 12/24/21 1225 12/24/21 1251  BP: (!) 145/78 (!) 144/78 (!) 151/79  (!) 174/73  Pulse: 63 (!) 58 (!) 59 (!) 56  Resp: 12 17 16 18   Temp:    (!) 97.1 F (36.2 C)  TempSrc:    Axillary  SpO2:   98% 96%  Weight:    71.9 kg  Height:       General.  Ill-appearing gentleman, in no acute distress. Pulmonary.  Lungs clear bilaterally, normal respiratory effort. CV.  Regular rate and rhythm, no JVD, rub or murmur. Abdomen.  Soft, nontender, nondistended, BS positive. CNS.  Somnolent but arousable Extremities.  No edema, no cyanosis, pulses intact and symmetrical. Psychiatry.  Judgment and insight appears impaired.  Data Reviewed: Prior data reviewed  Family Communication: Discussed with legal guardian  Disposition: Status is: Inpatient Remains inpatient appropriate because: Severity of illness   Planned Discharge Destination: Skilled nursing facility  DVT prophylaxis.  Subcu heparin Time spent: 50 minutes  This record has been created using Systems analyst. Errors have been sought and corrected,but may not always be located. Such creation errors do not reflect on the standard of care.  Author: Lorella Nimrod, MD 12/24/2021 3:06 PM  For on call review [email or web address].

## 2021-12-24 NOTE — Progress Notes (Signed)
   12/24/21 1251  Vitals  Temp (!) 97.1 F (36.2 C)  Temp Source Axillary  BP (!) 174/73  MAP (mmHg) 98  BP Location Right Leg  BP Method Automatic  Patient Position (if appropriate) Lying  Pulse Rate (!) 56  Pulse Rate Source Monitor  ECG Heart Rate (!) 57  Resp 18  Oxygen Therapy  SpO2 96 %  O2 Device Room Air  Post Treatment  Dialyzer Clearance Lightly streaked  Duration of HD Treatment -hour(s) 3.5 hour(s)  Hemodialysis Intake (mL) 100 mL  Liters Processed 84  Fluid Removed 400  Tolerated HD Treatment Yes  Post-Hemodialysis Comments tx completed goal met  AVG/AVF Arterial Site Held (minutes) 5 minutes  AVG/AVF Venous Site Held (minutes) 5 minutes  Fistula / Graft Left Upper arm Arteriovenous fistula  Placement Date/Time: (c) 12/15/21 2000   Orientation: Left  Access Location: Upper arm  Access Type: Arteriovenous fistula  Site Condition No complications  Fistula / Graft Assessment Present;Thrill;Bruit  Status Deaccessed  Vitals  Weight 71.9 kg  Height and Weight  Type of Weight Post-Dialysis   Treatment completed 400 ml removed, remained stable throughout treatment, AVF hemostasis achieved with no problems

## 2021-12-25 DIAGNOSIS — G9341 Metabolic encephalopathy: Secondary | ICD-10-CM | POA: Diagnosis not present

## 2021-12-25 MED ORDER — MINOXIDIL 2.5 MG PO TABS
10.0000 mg | ORAL_TABLET | Freq: Every day | ORAL | Status: DC
  Administered 2021-12-25 – 2022-01-04 (×11): 10 mg via ORAL
  Filled 2021-12-25 (×11): qty 4

## 2021-12-25 NOTE — Assessment & Plan Note (Signed)
Resumed on his home p.o. amlodipine 10 mg daily, clonidine 0.3 mg p.o. 3 times daily, hydralazine 100 mg p.o. 3 times daily, isosorbide mononitrate 60 mg p.o. twice daily, losartan 100 mg daily.  Pressures at time improved, but then blood pressure starts to spike again.  As above, changing ARB and adding alpha-blocker. -Nephrology also added clonidine patch along with p.o. clonidine -Increase minoxidil to 10 mg daily -Patient currently on nicardipine infusion again due to persistently elevated blood pressure above 200

## 2021-12-25 NOTE — Evaluation (Signed)
Physical Therapy Evaluation Patient Details Name: Todd Brown MRN: [id number] DOB: Oct 22, 1960 Today's Date: 12/25/2021  History of Present Illness  61 year old male with past medical history of hypertension and end-stage renal disease on hemodialysis presented to the emergency room from his group home/skilled nursing facility on 7/17 with confusion.  (Patient's baseline is that he is unable to care for himself and has a legal guardian.)  Patient last received dialysis on Friday, 7/14.  From being in the emergency room on 7/17, unable to get dialysis yesterday.  Patient found to have markedly elevated blood pressure with a systolic in the 568L.  Patient with questionable cardiac ischemia as well   Clinical Impression  Patient received in bed, he is alert, has difficulty answering questions, following direction. Increased time needed. Patient required mod A +2 for supine to sit. He is able to transfer with mod +2 assist and was able to take a couple of pivoting steps toward recliner. Patient anxious with this and began yelling out. He was left in recliner with chair alarm set. Patient demonstrating the need for more assistance for basic mobility compared to baseline and will continue to benefit from skilled PT to improve strength and independence.           Recommendations for follow up therapy are one component of a multi-disciplinary discharge planning process, led by the attending physician.  Recommendations may be updated based on patient status, additional functional criteria and insurance authorization.  Follow Up Recommendations Skilled nursing-short term rehab (<3 hours/day) Can patient physically be transported by private vehicle: No    Assistance Recommended at Discharge Frequent or constant Supervision/Assistance  Patient can return home with the following  Two people to help with walking and/or transfers;Two people to help with bathing/dressing/bathroom;Direct supervision/assist for  medications management;Direct supervision/assist for financial management;Assistance with cooking/housework;Assist for transportation;Help with stairs or ramp for entrance    Equipment Recommendations None recommended by PT  Recommendations for Other Services       Functional Status Assessment Patient has had a recent decline in their functional status and/or demonstrates limited ability to make significant improvements in function in a reasonable and predictable amount of time     Precautions / Restrictions Precautions Precautions: Fall Precaution Comments: watch BP Restrictions Weight Bearing Restrictions: No      Mobility  Bed Mobility Overal bed mobility: Needs Assistance Bed Mobility: Supine to Sit, Sit to Supine     Supine to sit: Mod assist, HOB elevated, +2 for physical assistance          Transfers Overall transfer level: Needs assistance Equipment used: Rolling walker (2 wheels) Transfers: Sit to/from Stand, Bed to chair/wheelchair/BSC Sit to Stand: Min assist, +2 physical assistance, +2 safety/equipment, From elevated surface   Step pivot transfers: Min assist       General transfer comment: step by step vcs for safety    Ambulation/Gait                  Stairs            Wheelchair Mobility    Modified Rankin (Stroke Patients Only)       Balance Overall balance assessment: Needs assistance Sitting-balance support: Feet supported Sitting balance-[name]: Fair     Standing balance support: Bilateral upper extremity supported, During functional activity, Reliant on assistive device for balance Standing balance-[name]: Poor  Pertinent Vitals/Pain Pain Assessment Pain Assessment: No/denies pain    Home Living Family/patient expects to be discharged to:: Assisted living                   Additional Comments: pt lives at [facility], per chart and previous  admissionpt SPT to bed/mwc with CGA; Per chart review assist from ALF staff for ADL    Prior Function Prior Level of Function : Needs assist             Mobility Comments: SPT to WC at baseline       Hand Dominance        Extremity/Trunk Assessment   Upper Extremity Assessment Upper Extremity Assessment: Defer to OT evaluation    Lower Extremity Assessment Lower Extremity Assessment: Generalized weakness       Communication   Communication: HOH  Cognition Arousal/Alertness: Awake/alert Behavior During Therapy: Agitated Overall Cognitive Status: No family/caregiver present to determine baseline cognitive functioning Area of Impairment: Orientation, Attention, Following commands, Safety/judgement, Awareness, Problem solving                 Orientation Level: Disoriented to, Place, Time, Situation Current Attention Level: Focused   Following Commands: Follows one step commands inconsistently Safety/Judgement: Decreased awareness of safety, Decreased awareness of deficits Awareness: Intellectual Problem Solving: Slow processing, Decreased initiation, Difficulty sequencing, Requires verbal cues, Requires tactile cues          General Comments General comments (skin integrity, edema, etc.): BP 114/74 (map 87) HR 84 seated in chair after session    Exercises     Assessment/Plan    PT Assessment Patient needs continued PT services  PT Problem List Decreased strength;Decreased balance;Decreased mobility;Decreased activity tolerance;Decreased cognition;Decreased coordination;Decreased safety awareness       PT Treatment Interventions DME instruction;Therapeutic activities;Therapeutic exercise;Functional mobility training;Patient/family education    PT Goals (Current goals can be found in the Care Plan section)  Acute Rehab PT Goals Patient Stated Goal: unable to state PT Goal Formulation: Patient unable to participate in goal setting Time For Goal  Achievement: 01/08/22    Frequency Min 2X/week     Co-evaluation PT/OT/SLP Co-Evaluation/Treatment: Yes Reason for Co-Treatment: For patient/therapist safety;To address functional/ADL transfers;Necessary to address cognition/behavior during functional activity PT goals addressed during session: Mobility/safety with mobility;Balance;Proper use of DME OT goals addressed during session: ADL's and self-care       AM-PAC PT "6 Clicks" Mobility  Outcome Measure Help needed turning from your back to your side while in a flat bed without using bedrails?: A Lot Help needed moving from lying on your back to sitting on the side of a flat bed without using bedrails?: A Lot Help needed moving to and from a bed to a chair (including a wheelchair)?: A Lot Help needed standing up from a chair using your arms (e.g., wheelchair or bedside chair)?: A Lot Help needed to walk in hospital room?: Total Help needed climbing 3-5 steps with a railing? : Total 6 Click Score: 10    End of Session Equipment Utilized During Treatment: Gait belt Activity Tolerance: Patient tolerated treatment well;Other (comment) (limited by cognition/ability to follow direction) Patient left: in chair;with chair alarm set Nurse Communication: Mobility status PT Visit Diagnosis: Other abnormalities of gait and mobility (R26.89);Muscle weakness (generalized) (M62.81);Unsteadiness on feet (R26.81)    Time: 1020-1041 PT Time Calculation (min) (ACUTE ONLY): 21 min   Charges:   PT Evaluation $PT Eval Moderate Complexity: 1 Mod  Neithan Day, PT, GCS 12/25/21,12:45 PM

## 2021-12-25 NOTE — Assessment & Plan Note (Signed)
Waxing and weaning.  Suspecting related to elevated blood pressure. Increased somnolence seems improved after stopping scheduled Benadryl and Haldol.  Blood pressure still a challenge to control. -Continue to monitor

## 2021-12-25 NOTE — Assessment & Plan Note (Signed)
Patient on multiple antihypertensives with very poor response. Again transferred to stepdown and started on nicardipine infusion. Multiple failed attempts to wean him off from nicardipine infusion. -Increase the dose of minoxidil to 10 mg daily -Continue to monitor and nicardipine infusion.

## 2021-12-25 NOTE — Progress Notes (Addendum)
Occupational Therapy Treatment Patient Details Name: Todd Brown MRN: [id number] DOB: 1960/12/26 Today's Date: 12/25/2021   History of present illness 61 year old male with past medical history of hypertension and end-stage renal disease on hemodialysis presented to the emergency room from his group home/skilled nursing facility on 7/17 with confusion.  (Patient's baseline is that he is unable to care for himself and has a legal guardian.)  Patient last received dialysis on Friday, 7/14.  From being in the emergency room on 7/17, unable to get dialysis yesterday.  Patient found to have markedly elevated blood pressure with a systolic in the 662H.  Patient with questionable cardiac ischemia as well   OT comments  Chart reviewed, RN cleared pt for participation in OT re-evaluation. Co tx completed with PT on this date. Pt with improvements noted in alertness. Pt is oriented to self only, inconsistent one step direction following. MOD A required for supine>sit, STS with MIN-MOD A, step pivot to beside chair with CGA-MIN A. MOD A required for UB dressing. Pt is left in bedside chair, NAD, all needs met. Discharge recommendation remains appropriate. Goals remain appropriate. OT will continue to follow acutely.    Recommendations for follow up therapy are one component of a multi-disciplinary discharge planning process, led by the attending physician.  Recommendations may be updated based on patient status, additional functional criteria and insurance authorization.    Follow Up Recommendations  Skilled nursing-short term rehab (<3 hours/day)    Assistance Recommended at Discharge Frequent or constant Supervision/Assistance  Patient can return home with the following  A lot of help with bathing/dressing/bathroom;A lot of help with walking and/or transfers   Equipment Recommendations  Other (comment) (per next venue of care)    Recommendations for Other Services      Precautions / Restrictions  Precautions Precautions: Fall Precaution Comments: watch BP Restrictions Weight Bearing Restrictions: No       Mobility Bed Mobility Overal bed mobility: Needs Assistance Bed Mobility: Supine to Sit     Supine to sit: Mod assist, HOB elevated, +2 for physical assistance          Transfers Overall transfer level: Needs assistance Equipment used: Rolling walker (2 wheels) Transfers: Sit to/from Stand, Bed to chair/wheelchair/BSC Sit to Stand: Min assist, +2 safety/equipment, Mod assist     Step pivot transfers: Min assist, Min guard, +2 safety/equipment     General transfer comment: step by step vcs for safety     Balance Overall balance assessment: Needs assistance Sitting-balance support: Feet supported Sitting balance-[name]: Fair     Standing balance support: Bilateral upper extremity supported, During functional activity, Reliant on assistive device for balance Standing balance-[name]: Poor                             ADL either performed or assessed with clinical judgement   ADL Overall ADL's : Needs assistance/impaired                                       General ADL Comments: MOD A UB dressing, MAX A LB dressing, MOD A grooming tasks in sitting    Extremity/Trunk Assessment Upper Extremity Assessment Upper Extremity Assessment: Difficult to assess due to impaired cognition;Generalized weakness   Lower Extremity Assessment Lower Extremity Assessment: Difficult to assess due to impaired cognition;Generalized weakness  Vision       Perception     Praxis      Cognition Arousal/Alertness: Awake/alert Behavior During Therapy: Restless Overall Cognitive Status: No family/caregiver present to determine baseline cognitive functioning Area of Impairment: Orientation, Attention, Following commands, Safety/judgement, Awareness, Problem solving                 Orientation Level: Disoriented to,  Place, Time, Situation Current Attention Level: Focused   Following Commands: Follows one step commands inconsistently Safety/Judgement: Decreased awareness of safety, Decreased awareness of deficits Awareness: Intellectual Problem Solving: Slow processing, Decreased initiation, Difficulty sequencing, Requires verbal cues, Requires tactile cues          Exercises      Shoulder Instructions       General Comments BP 114/74 (map 87) HR 84 seated in chair after session    Pertinent Vitals/ Pain       Pain Assessment Pain Assessment: Faces Faces Pain Scale: Hurts a little bit Pain Location: generalized Pain Descriptors / Indicators: Grimacing Pain Intervention(s): Monitored during session  Home Living                                          Prior Functioning/Environment              Frequency  Min 2X/week        Progress Toward Goals  OT Goals(current goals can now be found in the care plan section)  Progress towards OT goals: Progressing toward goals     Plan Discharge plan remains appropriate    Co-evaluation    PT/OT/SLP Co-Evaluation/Treatment: Yes Reason for Co-Treatment: Complexity of the patient's impairments (multi-system involvement);Necessary to address cognition/behavior during functional activity;To address functional/ADL transfers   OT goals addressed during session: ADL's and self-care      AM-PAC OT "6 Clicks" Daily Activity     Outcome Measure   Help from another person eating meals?: A Lot Help from another person taking care of personal grooming?: A Lot Help from another person toileting, which includes using toliet, bedpan, or urinal?: A Lot Help from another person bathing (including washing, rinsing, drying)?: A Lot Help from another person to put on and taking off regular upper body clothing?: A Lot Help from another person to put on and taking off regular lower body clothing?: A Lot 6 Click Score: 12     End of Session Equipment Utilized During Treatment: Gait belt;Rolling walker (2 wheels)  OT Visit Diagnosis: Unsteadiness on feet (R26.81);Muscle weakness (generalized) (M62.81)   Activity Tolerance Patient tolerated treatment well   Patient Left in chair;with call bell/phone within reach;with chair alarm set   Nurse Communication Mobility status        Time: 1020-1040 OT Time Calculation (min): 20 min  Charges: OT General Charges $OT Visit: 1 Visit OT Evaluation $OT Re-eval: 1 Re-eval  Shanon Payor, OTD OTR/L  12/25/21, 12:02 PM

## 2021-12-25 NOTE — Progress Notes (Signed)
Progress Note   Patient: Todd Brown JFH:[id number] DOB: 04/18/1961 DOA: 12/15/2021     10 DOS: the patient was seen and examined on 12/25/2021   Brief hospital course: 61 year old male with past medical history of hypertension and end-stage renal disease on hemodialysis presented to the emergency room from his group home/skilled nursing facility on 7/17 with confusion.  (Patient's baseline is that he is unable to care for himself and has a legal guardian.)  Patient last received dialysis on Friday, 7/14.  From being in the emergency room on 7/17, unable to get dialysis yesterday.  Patient found to have markedly elevated blood pressure with a systolic in the 937D.  Patient with questionable cardiac ischemia as well.  CT scan of head and neurological exam unremarkable.  Patient was admitted to the hospitalist service and started on his oral antihypertensives, nicardipine drip and given IV labetalol and as needed IV hydralazine.  Nephrology consulted and plans to take patient for dialysis tomorrow.  Attempts to wean off Cardene have led to increasing high blood pressure, compounded by patient at first refusing to take oral medications.  Had become more physically agitated, wanting to be left alone.  Seen by psychiatry and started on scheduled and as needed Haldol.  Has become more compliant and better mentating and taking medications and blood pressure improving.  Also developed some periorbital swelling bilaterally felt to be secondary to subcutaneous emphysema and started on steroids, and periorbital swelling has since resolved.  Patient was supposed to be discharged back to his assisted living facility on 7/21 after dialysis.  His baseline is that he is able to sit in a chair for dialysis although some assistance.  And trying to get him out of bed and placed in a wheelchair, took 3 people and still patient unable to sit and stay in chair.  Discharge canceled and plan is for patient to have PT/OT  evaluation and possible skilled nursing prior to returning to assisted living.  7/24: PT and OT are recommending rehab.  TOC is working on it. Found to have some oral thrush.  Started on Magic mouthwash and prednisone was discontinued. Blood pressure remained elevated, neurology started on clonidine patch along with current multiple antihypertensives.  Had his dialysis today.  7/25: Patient was transferred to stepdown and started on nicardipine infusion due to persistently elevated blood pressure last night.  Cardene infusion was stopped at 9:30 AM when blood pressure improved to 428 systolic, have to restart as it started rising significantly despite getting all the scheduled antihypertensives. Remained quite somnolent.  Apparently was getting scheduled Benadryl which was made as needed.  Scheduled daily Haldol was also made as needed to see if that will affect his excessive sleep.  7/26: Blood pressure dropped during dialysis and nicardipine drip was discontinued.  Later started trending up.  He was also started on minoxidil at 5 mg daily and dose will be titrated. Little more alert and less somnolence after stopping scheduled Benadryl and Haldol yesterday.  7/27: Patient more alert and interactive.  Blood pressure remained elevated with multiple failed attempts to wean off nicardipine infusion.  Minoxidil dose was increased to 10 mg today.    Assessment and Plan: * Acute metabolic encephalopathy Waxing and weaning.  Suspecting related to elevated blood pressure. Increased somnolence seems improved after stopping scheduled Benadryl and Haldol.  Blood pressure still a challenge to control. -Continue to monitor  Hypertensive emergency Patient on multiple antihypertensives with very poor response. Again transferred to stepdown and started  on nicardipine infusion. Multiple failed attempts to wean him off from nicardipine infusion. -Increase the dose of minoxidil to 10 mg daily -Continue to  monitor and nicardipine infusion.  ESRD on dialysis [facility]) Nephrology consulted, full dialysis session following admission and again today to keep him on his Monday Wednesday Friday schedule. -Continue scheduled dialysis  Mental health disorder Unspecified.  Patient is unable to care for himself.  It is unclear if he is ever had a formal diagnosis.  He is on Tegretol and Artane which he should continue.  As needed Haldol for acute behavior agitation as above.  Essential hypertension Resumed on his home p.o. amlodipine 10 mg daily, clonidine 0.3 mg p.o. 3 times daily, hydralazine 100 mg p.o. 3 times daily, isosorbide mononitrate 60 mg p.o. twice daily, losartan 100 mg daily.  Pressures at time improved, but then blood pressure starts to spike again.  As above, changing ARB and adding alpha-blocker. -Nephrology also added clonidine patch along with p.o. clonidine -Increase minoxidil to 10 mg daily -Patient currently on nicardipine infusion again due to persistently elevated blood pressure above 200  Oral thrush Patient was found to have oral thrush today.  He was on high-dose steroid, which were discontinued. -Magic mouthwash.  Subcutaneous emphysema (HCC)-resolved as of 12/18/2021 On steroids.  Appreciate pulmonary assistance.  Subcutaneous emphysema looks to be resolved.  Changed from Solu-Medrol to p.o. prednisone quick taper.  Physical deconditioning PT OT are recommending SNF. TOC to work on placement  Unspecified protein-calorie malnutrition ([place]) Estimated body mass index is 21.08 kg/m as calculated from the following:   Height as of this encounter: 6' (1.829 m).   Weight as of this encounter: 70.5 kg.   Patient was found to have moderate degree malnourishment. -Dietitian consult     Subjective: Patient was sitting upright and eating breakfast when seen today.  Denies any complaints.  Appears to be at his baseline.  Physical Exam: Vitals:   12/25/21 0600 12/25/21 0700  12/25/21 0800 12/25/21 1000  BP: (!) 149/97 (!) 145/79 (!) 159/106 140/85  Pulse:      Resp: (!) 24 19 17 15   Temp:      TempSrc:   Oral   SpO2: 92%  90%   Weight:      Height:       General.  Cognitively impaired gentleman, in no acute distress. Pulmonary.  Lungs clear bilaterally, normal respiratory effort. CV.  Regular rate and rhythm, no JVD, rub or murmur. Abdomen.  Soft, nontender, nondistended, BS positive. CNS.  Alert and oriented .   Extremities.  No edema, no cyanosis, pulses intact and symmetrical. Psychiatry.  Judgment and insight appears impaired.  Data Reviewed: Prior data reviewed  Family Communication:   Disposition: Status is: Inpatient Remains inpatient appropriate because: Severity of illness.   Planned Discharge Destination: Skilled nursing facility  DVT prophylaxis.  Subcu heparin Time spent: 50 minutes  This record has been created using Systems analyst. Errors have been sought and corrected,but may not always be located. Such creation errors do not reflect on the standard of care.  Author: Lorella Nimrod, MD 12/25/2021 2:17 PM  For on call review [email or web address].

## 2021-12-25 NOTE — Progress Notes (Addendum)
[facility]  ROUNDING NOTE   Subjective:   Todd Brown is a 61 year old male with past medical history including hypertension, anemia, schizophrenia and end-stage renal disease on hemodialysis.  Patient has presented to the ED with altered mental status.  Patient has been admitted for Altered mental status [R41.82] Hypertensive urgency [I16.0] Severe hypertension [I10] Altered mental status, unspecified altered mental status type [R41.82]  Patient is known to our practice and receives outpatient dialysis treatments at [facility] on a MWF schedule.  Update  Patient resting comfortably Alert, oriented to self Room air Denies nausea  Nicardipine drip in place   Objective:  Vital signs in last 24 hours:  Temp:  [97.1 F (36.2 C)-98.3 F (36.8 C)] 98.2 F (36.8 C) (07/26 1948) Pulse Rate:  [56-79] 79 (07/27 0000) Resp:  [0-34] 24 (07/27 0600) BP: (109-200)/(61-97) 149/97 (07/27 0600) SpO2:  [89 %-99 %] 92 % (07/27 0600) Weight:  [71.9 kg] 71.9 kg (07/26 1251)  Weight change:  Filed Weights   12/22/21 2130 12/24/21 0815 12/24/21 1251  Weight: 71 kg 72.5 kg 71.9 kg    Intake/Output: I/O last 3 completed shifts: In: 1157.1 [P.O.:237; I.V.:920.1] Out: 400 [Other:400]   Intake/Output this shift:  No intake/output data recorded.  Physical Exam: General: NAD  Head: Normocephalic, atraumatic. Moist oral mucosal membranes  Eyes: Anicteric  Lungs:  Clear to auscultation, normal effort, room air  Heart: Regular rate and rhythm  Abdomen:  Soft, nontender, non distended  Extremities:  No peripheral edema.  Neurologic: Alert, answer simple questions  Skin: No lesions  Access: Lt AVF    Basic Metabolic Panel: Recent Labs  Lab 12/20/21 0942 12/22/21 0734 12/24/21 0700  NA 132* 133* 133*  K 4.0 4.3 4.1  CL 94* 94* 96*  CO2 26 27 27   GLUCOSE 88 59* 90  BUN 34* 49* 38*  CREATININE 4.52* 6.40* 5.60*  CALCIUM 9.5 9.5 9.2  PHOS 3.4  --  2.5      Liver Function Tests: Recent Labs  Lab 12/20/21 0942 12/24/21 0700  ALBUMIN 3.5 3.3*    No results for input(s): "LIPASE", "AMYLASE" in the last 168 hours. No results for input(s): "AMMONIA" in the last 168 hours.  CBC: Recent Labs  Lab 12/20/21 0942 12/22/21 0734 12/24/21 0700  WBC 3.2* 4.4 4.1  NEUTROABS  --  3.0 2.9  HGB 10.9* 10.5* 10.1*  HCT 31.4* 29.7* 29.4*  MCV 94.6 93.4 95.1  PLT 171 155 147*     Cardiac Enzymes: No results for input(s): "CKTOTAL", "CKMB", "CKMBINDEX", "TROPONINI" in the last 168 hours.  BNP: Invalid input(s): "POCBNP"  CBG: Recent Labs  Lab 12/18/21 2116 12/22/21 2132  GLUCAP 112* 103*     Microbiology: Results for orders placed or performed during the hospital encounter of 12/15/21  SARS Coronavirus 2 by RT PCR (hospital order, performed in [facility] hospital lab) *cepheid single result test* Anterior Nasal Swab     Status: None   Collection Time: 12/15/21  5:50 PM   Specimen: Anterior Nasal Swab  Result Value Ref Range Status   SARS Coronavirus 2 by RT PCR NEGATIVE NEGATIVE Final    Comment: (NOTE) SARS-CoV-2 target nucleic acids are NOT DETECTED.  The SARS-CoV-2 RNA is generally detectable in upper and lower respiratory specimens during the acute phase of infection. The lowest concentration of SARS-CoV-2 viral copies this assay can detect is 250 copies / mL. A negative result does not preclude SARS-CoV-2 infection and should not be used as the  sole basis for treatment or other patient management decisions.  A negative result may occur with improper specimen collection / handling, submission of specimen other than nasopharyngeal swab, presence of viral mutation(s) within the areas targeted by this assay, and inadequate number of viral copies (<250 copies / mL). A negative result must be combined with clinical observations, patient history, and epidemiological information.  Fact Sheet for Patients:    https://www.patel.info/  Fact Sheet for Healthcare Providers: https://hall.com/  This test is not yet approved or  cleared by the [place] FDA and has been authorized for detection and/or diagnosis of SARS-CoV-2 by FDA under an Emergency Use Authorization (EUA).  This EUA will remain in effect (meaning this test can be used) for the duration of the COVID-19 declaration under Section 564(b)(1) of the Act, 21 U.S.C. section 360bbb-3(b)(1), unless the authorization is terminated or revoked sooner.  Performed at [facility], [address] [place], [place] [address]   MRSA Next Gen by PCR, Nasal     Status: None   Collection Time: 12/15/21  9:03 PM   Specimen: Nasal Mucosa; Nasal Swab  Result Value Ref Range Status   MRSA by PCR Next Gen NOT DETECTED NOT DETECTED Final    Comment: (NOTE) The GeneXpert MRSA Assay (FDA approved for NASAL specimens only), is one component of a comprehensive MRSA colonization surveillance program. It is not intended to diagnose MRSA infection nor to guide or monitor treatment for MRSA infections. Test performance is not FDA approved in patients less than 39 years old. Performed at [facility], [address] [place], [place] [address]     Coagulation Studies: No results for input(s): "LABPROT", "INR" in the last 72 hours.  Urinalysis: No results for input(s): "COLORURINE", "LABSPEC", "PHURINE", "GLUCOSEU", "HGBUR", "BILIRUBINUR", "KETONESUR", "PROTEINUR", "UROBILINOGEN", "NITRITE", "LEUKOCYTESUR" in the last 72 hours.  Invalid input(s): "APPERANCEUR"    Imaging: No results found.   Medications:    anticoagulant sodium citrate     niCARDipine 3 mg/hr (12/25/21 0604)    (feeding supplement) PROSource Plus  30 mL Oral TID BM   amLODipine  10 mg Oral q morning   calcium acetate  1,334 mg Oral TID WC   carbamazepine  1,000 mg Oral q AM   Chlorhexidine Gluconate Cloth  6  each Topical Daily   cloNIDine  0.2 mg Transdermal Weekly   doxazosin  4 mg Oral QHS   feeding supplement (NEPRO CARB STEADY)  237 mL Oral TID BM   ferrous sulfate  325 mg Oral Q breakfast   fluPHENAZine  2.5 mg Oral q AM   heparin  5,000 Units Subcutaneous Q8H   hydrALAZINE  100 mg Oral TID   irbesartan  300 mg Oral Daily   isosorbide mononitrate  60 mg Oral BID   magic mouthwash  10 mL Oral QID   minoxidil  10 mg Oral Daily   mometasone-formoterol  2 puff Inhalation BID   multivitamin  1 tablet Oral QHS   pantoprazole  40 mg Oral Daily   trihexyphenidyl  2 mg Oral BID WC   albuterol, alteplase, anticoagulant sodium citrate, diphenhydrAMINE, haloperidol lactate, heparin, hydrALAZINE, lidocaine (PF), lidocaine-prilocaine, ondansetron **OR** ondansetron (ZOFRAN) IV, mouth rinse, pentafluoroprop-tetrafluoroeth, polyethylene glycol, senna-docusate  Assessment/ Plan:  [name] is a 60 y.o.  male with past medical history including hypertension, anemia, schizophrenia and end-stage renal disease on hemodialysis.  Patient has presented to the ED with altered mental status.  Patient has been admitted for Altered mental  status [R41.82] Hypertensive urgency [I16.0] Severe hypertension [I10] Altered mental status, unspecified altered mental status type [R41.82]  [facility]/MWF/Lt AVF  End-stage renal disease on hemodialysis.  Will maintain outpatient schedule if possible.    Received dialysis yesterday, UF 455mL achieved. Next treatment scheduled for Friday.  2. Anemia of chronic kidney disease Lab Results  Component Value Date   HGB 10.1 (L) 12/24/2021    Hgb within desired range. Mircera prescribed outpatient.    3. Secondary Hyperparathyroidism:  Lab Results  Component Value Date   CALCIUM 9.2 12/24/2021   PHOS 2.5 12/24/2021   Continue calcium acetate with meals.    4.  Hypertension with chronic kidney disease.  Home regimen includes amlodipine, clonidine,  hydralazine, isosorbide, and losartan.    Continues to be in Nicardipine drip. Failed to wean drip during night. Blood pressure 140/85   LOS: 10   7/27/202310:05 AM

## 2021-12-25 NOTE — Progress Notes (Signed)
Patient's blood pressure dropped to 129/68, stopped the drip for 2 hours and the blood pressure increased to 190 systole. Resumed the drip at 30cc/hour. Patient is cooperative with with staff but is confused.

## 2021-12-25 NOTE — Progress Notes (Signed)
Nutrition Follow-up  DOCUMENTATION CODES:   Non-severe (moderate) malnutrition in context of chronic illness  INTERVENTION:   Nepro Shake po TID, each supplement provides 425 kcal and 19 grams protein  Rena-vit po daily   Pt at high refeed risk; recommend monitor potassium, magnesium and phosphorus labs daily until stable  NUTRITION DIAGNOSIS:   Moderate Malnutrition related to chronic illness (ESRD on HD) as evidenced by mild fat depletion, mild muscle depletion, moderate muscle depletion. -ongoing  GOAL:   Patient will meet greater than or equal to 90% of their needs -progressing   MONITOR:   PO intake, Supplement acceptance, Labs, Weight trends, Skin, I & O's  ASSESSMENT:   62 y/o male with h/o HTN, ESRD on HD, schizophrenia and lives in a group home who is admitted with thrush and AMS.  Met with pt in room today. Pt sitting up in chair at time of RD visit. Pt does not provide reliable history. RN reports pt ate 100% of his breakfast this morning. Pt does require full assist with meals. RN is planning to offer pt a Nepro this morning. Per chart, pt appears weight stable since admission. Pt +1.6L on his I & Os.   Medications reviewed and include: phoslo, ferrous sulfate, heparin, rena-vit, protonix  Labs reviewed: Na 133(L), K 4.1 wnl, BUN 38(H), creat 5.60(H), P 2.5 wnl Hgb 10.1(L), Hct 29.4(L)  Diet Order:   Diet Order             DIET DYS 3 Room service appropriate? Yes with Assist; Fluid consistency: Thin; Fluid restriction: 1200 mL Fluid  Diet effective now           DIET DYS 3                  EDUCATION NEEDS:   No education needs have been identified at this time  Skin:  Skin Assessment: Reviewed RN Assessment  Last BM:  12/21/21  Height:   Ht Readings from Last 1 Encounters:  12/22/21 6' (1.829 m)    Weight:   Wt Readings from Last 1 Encounters:  12/24/21 71.9 kg    Ideal Body Weight:  80.9 kg  BMI:  Body mass index is 21.5  kg/m.  Estimated Nutritional Needs:   Kcal:  2100-2300kcal/day   Protein:  105-120 grams/day   Fluid:  1000 ml +UOP  Koleen Distance MS, RD, LDN Please refer to [facility] for RD and/or RD on-call/weekend/after hours pager

## 2021-12-26 DIAGNOSIS — G9341 Metabolic encephalopathy: Secondary | ICD-10-CM | POA: Diagnosis not present

## 2021-12-26 LAB — RENAL FUNCTION PANEL
Albumin: 3.2 g/dL — ABNORMAL LOW (ref 3.5–5.0)
Anion gap: 9 (ref 5–15)
BUN: 43 mg/dL — ABNORMAL HIGH (ref 8–23)
CO2: 31 mmol/L (ref 22–32)
Calcium: 9.5 mg/dL (ref 8.9–10.3)
Chloride: 95 mmol/L — ABNORMAL LOW (ref 98–111)
Creatinine, Ser: 5.12 mg/dL — ABNORMAL HIGH (ref 0.61–1.24)
GFR, Estimated: 12 mL/min — ABNORMAL LOW (ref >60)
Glucose, Bld: 90 mg/dL (ref 70–99)
Phosphorus: 1.8 mg/dL — ABNORMAL LOW (ref 2.5–4.6)
Potassium: 4.5 mmol/L (ref 3.5–5.1)
Sodium: 135 mmol/L (ref 135–145)

## 2021-12-26 LAB — CBC
HCT: 26.6 % — ABNORMAL LOW (ref 39.0–52.0)
Hemoglobin: 9.2 g/dL — ABNORMAL LOW (ref 13.0–17.0)
MCH: 33.2 pg (ref 26.0–34.0)
MCHC: 34.6 g/dL (ref 30.0–36.0)
MCV: 96 fL (ref 80.0–100.0)
Platelets: 154 10*3/uL (ref 150–400)
RBC: 2.77 MIL/uL — ABNORMAL LOW (ref 4.22–5.81)
RDW: 14 % (ref 11.5–15.5)
WBC: 4.6 10*3/uL (ref 4.0–10.5)
nRBC: 0 % (ref 0.0–0.2)

## 2021-12-26 MED ORDER — POLYETHYLENE GLYCOL 3350 17 G PO PACK
17.0000 g | PACK | Freq: Two times a day (BID) | ORAL | Status: DC
  Administered 2021-12-26 – 2022-02-17 (×77): 17 g via ORAL
  Filled 2021-12-26 (×85): qty 1

## 2021-12-26 NOTE — Progress Notes (Signed)
Post HD vitals 

## 2021-12-26 NOTE — Progress Notes (Signed)
Occupational Therapy Treatment Patient Details Name: Todd Brown MRN: [id number] DOB: 1960-12-21 Today's Date: 12/26/2021   History of present illness 61 year old male with past medical history of hypertension and end-stage renal disease on hemodialysis presented to the emergency room from his group home/skilled nursing facility on 7/17 with confusion.  (Patient's baseline is that he is unable to care for himself and has a legal guardian.)  Patient last received dialysis on Friday, 7/14.  From being in the emergency room on 7/17, unable to get dialysis yesterday.  Patient found to have markedly elevated blood pressure with a systolic in the 354S.  Patient with questionable cardiac ischemia as well   OT comments  Chart reviewed, RN cleared pt for participation in OT tx session. Co tx completed with PT on this date. Pt is oriented to self only, inconsistent one step direction following affecting safe and optimal mobility on this date. MOD A required UB dressing and grooming tasks seated on edge of bed. MOD A +2 required for step pivot transfer to bed from chair after multiple unsuccessful attempts with RW. Pt is making progress towards gaols, will continue to benefit from skilled OT to address functional deficits. OT will continue to follow acutely.    Recommendations for follow up therapy are one component of a multi-disciplinary discharge planning process, led by the attending physician.  Recommendations may be updated based on patient status, additional functional criteria and insurance authorization.    Follow Up Recommendations  Skilled nursing-short term rehab (<3 hours/day)    Assistance Recommended at Discharge Frequent or constant Supervision/Assistance  Patient can return home with the following  A lot of help with bathing/dressing/bathroom;A lot of help with walking and/or transfers   Equipment Recommendations  Other (comment) (per next venue of care)    Recommendations for Other  Services      Precautions / Restrictions Precautions Precautions: Fall Restrictions Weight Bearing Restrictions: No       Mobility Bed Mobility Overal bed mobility: Needs Assistance Bed Mobility: Sit to Supine       Sit to supine: Max assist, +2 for physical assistance        Transfers Overall transfer level: Needs assistance Equipment used: None Transfers: Sit to/from Stand, Bed to chair/wheelchair/BSC Sit to Stand: Min assist, +2 physical assistance, +2 safety/equipment, From elevated surface     Step pivot transfers: Mod assist, +2 physical assistance     General transfer comment: multiple attempts at standing due to decreased initiation from patient, poor one step direction following     Balance Overall balance assessment: Needs assistance Sitting-balance support: Feet supported Sitting balance-[name]: Fair   Postural control: Left lateral lean Standing balance support: Bilateral upper extremity supported, During functional activity Standing balance-[name]: Poor                             ADL either performed or assessed with clinical judgement   ADL Overall ADL's : Needs assistance/impaired                                       General ADL Comments: MOD A UB dressing, MOD A grooming tasks in sitting    Extremity/Trunk Assessment              Vision       Perception     Praxis  Cognition Arousal/Alertness: Awake/alert Behavior During Therapy: Flat affect Overall Cognitive Status: No family/caregiver present to determine baseline cognitive functioning Area of Impairment: Orientation, Attention, Following commands, Safety/judgement, Awareness, Problem solving                 Orientation Level: Disoriented to, Place, Time, Situation Current Attention Level: Focused   Following Commands: Follows one step commands inconsistently Safety/Judgement: Decreased awareness of safety, Decreased  awareness of deficits Awareness: Intellectual Problem Solving: Slow processing, Decreased initiation, Difficulty sequencing, Requires verbal cues, Requires tactile cues          Exercises      Shoulder Instructions       General Comments      Pertinent Vitals/ Pain       Pain Assessment Pain Assessment: Faces Faces Pain Scale: No hurt  Home Living                                          Prior Functioning/Environment              Frequency  Min 2X/week        Progress Toward Goals  OT Goals(current goals can now be found in the care plan section)  Progress towards OT goals: Progressing toward goals     Plan Discharge plan remains appropriate    Co-evaluation    PT/OT/SLP Co-Evaluation/Treatment: Yes Reason for Co-Treatment: Complexity of the patient's impairments (multi-system involvement);Necessary to address cognition/behavior during functional activity;For patient/therapist safety;To address functional/ADL transfers PT goals addressed during session: Mobility/safety with mobility OT goals addressed during session: ADL's and self-care      AM-PAC OT "6 Clicks" Daily Activity     Outcome Measure   Help from another person eating meals?: A Lot Help from another person taking care of personal grooming?: A Lot Help from another person toileting, which includes using toliet, bedpan, or urinal?: A Lot Help from another person bathing (including washing, rinsing, drying)?: A Lot Help from another person to put on and taking off regular upper body clothing?: A Lot Help from another person to put on and taking off regular lower body clothing?: A Lot 6 Click Score: 12    End of Session Equipment Utilized During Treatment: Gait belt;Rolling walker (2 wheels)  OT Visit Diagnosis: Unsteadiness on feet (R26.81);Muscle weakness (generalized) (M62.81)   Activity Tolerance Patient tolerated treatment well   Patient Left in bed;with call  bell/phone within reach;with bed alarm set   Nurse Communication          Time: 2130-8657 OT Time Calculation (min): 26 min  Charges: OT General Charges $OT Visit: 1 Visit OT Treatments $Self Care/Home Management : 8-22 mins  Shanon Payor, OTD OTR/L  12/26/21, 1:03 PM

## 2021-12-26 NOTE — TOC Progression Note (Signed)
Transition of Care [facility]) - Progression Note    Patient Details  Name: Todd Brown MRN: [id number] Date of Birth: Oct 09, 1960  [facility] CM/SW Contact  Shelbie Hutching, RN Phone Number: 12/26/2021, 3:44 PM  Clinical Narrative:    No bed offers, bed search extended.     Expected Discharge Plan: [place] Barriers to Discharge: Continued Medical Work up  Expected Discharge Plan and Services Expected Discharge Plan: [place] arrangements for the past 2 months: [place] Expected Discharge Date: 12/19/21                                     Social Determinants of Health (SDOH) Interventions    Readmission Risk Interventions     No data to display

## 2021-12-26 NOTE — Progress Notes (Signed)
Pt completed 3.5 hr HD tx, 561ml fluid removed and 84L BVP. Pt alert, no c/o, stable, report to primary RN, CCMD notified. Pt LAVF positive for thrill and bruit. Sites held w/ no drainage noted.

## 2021-12-26 NOTE — Progress Notes (Signed)
[facility]  ROUNDING NOTE   Subjective:   Archer Moist is a 61 year old male with past medical history including hypertension, anemia, schizophrenia and end-stage renal disease on hemodialysis.  Patient has presented to the ED with altered mental status.  Patient has been admitted for Altered mental status [R41.82] Hypertensive urgency [I16.0] Severe hypertension [I10] Altered mental status, unspecified altered mental status type [R41.82]  Patient is known to our practice and receives outpatient dialysis treatments at [facility] on a MWF schedule.  Update  Patient resting comfortably Alert, oriented to self Room air Denies nausea Off IV drip.  Awaiting hemodialysis today.   Objective:  Vital signs in last 24 hours:  Temp:  [98.1 F (36.7 C)-98.5 F (36.9 C)] 98.1 F (36.7 C) (07/27 2000) Pulse Rate:  [66-115] 66 (07/28 0740) Resp:  [8-22] 14 (07/28 0740) BP: (101-174)/(60-87) 152/87 (07/28 0700) SpO2:  [83 %-100 %] 91 % (07/28 0740)  Weight change:  Filed Weights   12/22/21 2130 12/24/21 0815 12/24/21 1251  Weight: 71 kg 72.5 kg 71.9 kg    Intake/Output: I/O last 3 completed shifts: In: 1138.8 [P.O.:727; I.V.:411.8] Out: -    Intake/Output this shift:  No intake/output data recorded.  Physical Exam: General: NAD  Head: Normocephalic, atraumatic. Moist oral mucosal membranes  Eyes: Anicteric  Lungs:  Clear to auscultation, normal effort, room air  Heart: Regular rate and rhythm, + gallop  Abdomen:  Soft, nontender, non distended  Extremities:  No peripheral edema.  Neurologic: Alert, answer simple questions  Skin: No lesions  Access: Lt AVF    Basic Metabolic Panel: Recent Labs  Lab 12/20/21 0942 12/22/21 0734 12/24/21 0700  NA 132* 133* 133*  K 4.0 4.3 4.1  CL 94* 94* 96*  CO2 26 27 27   GLUCOSE 88 59* 90  BUN 34* 49* 38*  CREATININE 4.52* 6.40* 5.60*  CALCIUM 9.5 9.5 9.2  PHOS 3.4  --  2.5     Liver Function  Tests: Recent Labs  Lab 12/20/21 0942 12/24/21 0700  ALBUMIN 3.5 3.3*    No results for input(s): "LIPASE", "AMYLASE" in the last 168 hours. No results for input(s): "AMMONIA" in the last 168 hours.  CBC: Recent Labs  Lab 12/20/21 0942 12/22/21 0734 12/24/21 0700  WBC 3.2* 4.4 4.1  NEUTROABS  --  3.0 2.9  HGB 10.9* 10.5* 10.1*  HCT 31.4* 29.7* 29.4*  MCV 94.6 93.4 95.1  PLT 171 155 147*     Cardiac Enzymes: No results for input(s): "CKTOTAL", "CKMB", "CKMBINDEX", "TROPONINI" in the last 168 hours.  BNP: Invalid input(s): "POCBNP"  CBG: Recent Labs  Lab 12/22/21 2132  GLUCAP 103*     Microbiology: Results for orders placed or performed during the hospital encounter of 12/15/21  SARS Coronavirus 2 by RT PCR (hospital order, performed in [facility] hospital lab) *cepheid single result test* Anterior Nasal Swab     Status: None   Collection Time: 12/15/21  5:50 PM   Specimen: Anterior Nasal Swab  Result Value Ref Range Status   SARS Coronavirus 2 by RT PCR NEGATIVE NEGATIVE Final    Comment: (NOTE) SARS-CoV-2 target nucleic acids are NOT DETECTED.  The SARS-CoV-2 RNA is generally detectable in upper and lower respiratory specimens during the acute phase of infection. The lowest concentration of SARS-CoV-2 viral copies this assay can detect is 250 copies / mL. A negative result does not preclude SARS-CoV-2 infection and should not be used as the sole basis for treatment or other patient  management decisions.  A negative result may occur with improper specimen collection / handling, submission of specimen other than nasopharyngeal swab, presence of viral mutation(s) within the areas targeted by this assay, and inadequate number of viral copies (<250 copies / mL). A negative result must be combined with clinical observations, patient history, and epidemiological information.  Fact Sheet for Patients:   https://www.patel.info/  Fact Sheet  for Healthcare Providers: https://hall.com/  This test is not yet approved or  cleared by the [place] FDA and has been authorized for detection and/or diagnosis of SARS-CoV-2 by FDA under an Emergency Use Authorization (EUA).  This EUA will remain in effect (meaning this test can be used) for the duration of the COVID-19 declaration under Section 564(b)(1) of the Act, 21 U.S.C. section 360bbb-3(b)(1), unless the authorization is terminated or revoked sooner.  Performed at [facility], [address] [place], [place] [address]   MRSA Next Gen by PCR, Nasal     Status: None   Collection Time: 12/15/21  9:03 PM   Specimen: Nasal Mucosa; Nasal Swab  Result Value Ref Range Status   MRSA by PCR Next Gen NOT DETECTED NOT DETECTED Final    Comment: (NOTE) The GeneXpert MRSA Assay (FDA approved for NASAL specimens only), is one component of a comprehensive MRSA colonization surveillance program. It is not intended to diagnose MRSA infection nor to guide or monitor treatment for MRSA infections. Test performance is not FDA approved in patients less than 44 years old. Performed at [facility], [address] [place], [place] [address]     Coagulation Studies: No results for input(s): "LABPROT", "INR" in the last 72 hours.  Urinalysis: No results for input(s): "COLORURINE", "LABSPEC", "PHURINE", "GLUCOSEU", "HGBUR", "BILIRUBINUR", "KETONESUR", "PROTEINUR", "UROBILINOGEN", "NITRITE", "LEUKOCYTESUR" in the last 72 hours.  Invalid input(s): "APPERANCEUR"    Imaging: No results found.   Medications:    anticoagulant sodium citrate     niCARDipine Stopped (12/25/21 1121)    (feeding supplement) PROSource Plus  30 mL Oral TID BM   amLODipine  10 mg Oral q morning   calcium acetate  1,334 mg Oral TID WC   carbamazepine  1,000 mg Oral q AM   Chlorhexidine Gluconate Cloth  6 each Topical Daily   cloNIDine  0.2 mg Transdermal Weekly    doxazosin  4 mg Oral QHS   feeding supplement (NEPRO CARB STEADY)  237 mL Oral TID BM   ferrous sulfate  325 mg Oral Q breakfast   fluPHENAZine  2.5 mg Oral q AM   heparin  5,000 Units Subcutaneous Q8H   hydrALAZINE  100 mg Oral TID   irbesartan  300 mg Oral Daily   isosorbide mononitrate  60 mg Oral BID   magic mouthwash  10 mL Oral QID   minoxidil  10 mg Oral Daily   mometasone-formoterol  2 puff Inhalation BID   multivitamin  1 tablet Oral QHS   pantoprazole  40 mg Oral Daily   trihexyphenidyl  2 mg Oral BID WC   albuterol, alteplase, anticoagulant sodium citrate, diphenhydrAMINE, haloperidol lactate, heparin, hydrALAZINE, lidocaine (PF), lidocaine-prilocaine, ondansetron **OR** ondansetron (ZOFRAN) IV, mouth rinse, pentafluoroprop-tetrafluoroeth, polyethylene glycol, senna-docusate  Assessment/ Plan:  Mr. Shade Kaley is a 61 y.o.  male with past medical history including hypertension, anemia, schizophrenia and end-stage renal disease on hemodialysis.  Patient has presented to the ED with altered mental status.  Patient has been admitted for Altered mental status [R41.82] Hypertensive urgency [I16.0] Severe hypertension [I10]  Altered mental status, unspecified altered mental status type [R41.82]  [facility] /MWF/Lt AVF  End-stage renal disease on hemodialysis.  Will maintain outpatient schedule if possible.    Plan for hemodialysis today.  Blood pressure control acceptable.  2. Anemia of chronic kidney disease Lab Results  Component Value Date   HGB 10.1 (L) 12/24/2021    Hgb within desired range. Mircera prescribed outpatient.    3. Secondary Hyperparathyroidism:  Lab Results  Component Value Date   CALCIUM 9.2 12/24/2021   PHOS 2.5 12/24/2021   Continue calcium acetate with meals.    4.  Hypertension with chronic kidney disease.  Home regimen includes amlodipine, clonidine, hydralazine, isosorbide, and losartan.    Currently on amlodipine 10 mg daily,  clonidine patch 0.2 mg per 24 hours, Cardura 4 mg daily at bedtime, hydralazine 100 mg 3 times per day, Avapro 300 mg daily, isosorbide 60 mg twice a day, minoxidil 10 mg daily.     LOS: 11 Ramiyah Mcclenahan 7/28/20238:29 AM

## 2021-12-26 NOTE — Assessment & Plan Note (Signed)
Waxing and weaning.  Suspecting related to elevated blood pressure. Increased somnolence seems improved after stopping scheduled Benadryl and Haldol.  Blood pressure still a challenge to control. -Continue to monitor

## 2021-12-26 NOTE — Plan of Care (Signed)
  Problem: Clinical Measurements: Goal: Ability to maintain clinical measurements within normal limits will improve Outcome: Progressing   Problem: Clinical Measurements: Goal: Will remain free from infection Outcome: Progressing   

## 2021-12-26 NOTE — Progress Notes (Signed)
Pre HD RN assessment 

## 2021-12-26 NOTE — Progress Notes (Signed)
Physical Therapy Treatment Patient Details Name: Jebidiah Baggerly MRN: [id number] DOB: 01-24-1961 Today's Date: 12/26/2021   History of Present Illness 61 year old male with past medical history of hypertension and end-stage renal disease on hemodialysis presented to the emergency room from his group home/skilled nursing facility on 7/17 with confusion.  (Patient's baseline is that he is unable to care for himself and has a legal guardian.)  Patient last received dialysis on Friday, 7/14.  From being in the emergency room on 7/17, unable to get dialysis yesterday.  Patient found to have markedly elevated blood pressure with a systolic in the 096G.  Patient with questionable cardiac ischemia as well    PT Comments    Patient sitting up in the recliner chair on arrival to room. Patient seen for co-treatment with OT. The patient has difficulty with following single step commands. Several unsuccessful attempts to stand from the chair due to decreased patient initiation of movement. He required +2 person for eventual stand pivot to the chair. Recommend PT continue to follow to maximize independence and facilitate return to prior level of function. SNF recommended at this time.    Recommendations for follow up therapy are one component of a multi-disciplinary discharge planning process, led by the attending physician.  Recommendations may be updated based on patient status, additional functional criteria and insurance authorization.  Follow Up Recommendations  Skilled nursing-short term rehab (<3 hours/day) Can patient physically be transported by private vehicle: No   Assistance Recommended at Discharge Frequent or constant Supervision/Assistance  Patient can return home with the following Two people to help with walking and/or transfers;Two people to help with bathing/dressing/bathroom;Direct supervision/assist for medications management;Direct supervision/assist for financial management;Assistance with  cooking/housework;Assist for transportation;Help with stairs or ramp for entrance   Equipment Recommendations  Other (comment) (to be determined at next level of care)    Recommendations for Other Services       Precautions / Restrictions Precautions Precautions: Fall Restrictions Weight Bearing Restrictions: No     Mobility  Bed Mobility Overal bed mobility: Needs Assistance Bed Mobility: Sit to Supine       Sit to supine: Max assist, +2 for physical assistance   General bed mobility comments: assistance for trunk and BLE support. verbal cues for technique and sequencing    Transfers Overall transfer level: Needs assistance Equipment used: None (attempted to use rolling walker unsuccessfully) Transfers: Bed to chair/wheelchair/BSC     Step pivot transfers: Mod assist, +2 physical assistance       General transfer comment: multiple standing bouts attempted unsuccessfully due to decreased initiation from the patient despite faciliation and multi-modal cues. patient eventually performed stand pivot transfer with +2 person assistance and maximal cues for technique, positioning, safety.    Ambulation/Gait               General Gait Details: not attempted due to poor standing tolerance   Stairs             Wheelchair Mobility    Modified Rankin (Stroke Patients Only)       Balance Overall balance assessment: Needs assistance Sitting-balance support: Feet supported Sitting balance-[name]: Fair     Standing balance support: Bilateral upper extremity supported, During functional activity Standing balance-[name]: Poor Standing balance comment: external support required                            Cognition Arousal/Alertness: Awake/alert Behavior During Therapy: Flat affect Overall  Cognitive Status: No family/caregiver present to determine baseline cognitive functioning                                 General  Comments: patient has difficulty with following single step commands. multi-modal cues required with significant increased time required        Exercises General Exercises - Lower Extremity Long Arc Quad: AAROM, Strengthening, Both (x 3 reps each) Other Exercises Other Exercises: cues for initiation and technique    General Comments        Pertinent Vitals/Pain Pain Assessment Pain Assessment: Faces Faces Pain Scale: No hurt    Home Living                          Prior Function            PT Goals (current goals can now be found in the care plan section) Acute Rehab PT Goals Patient Stated Goal: unable to state PT Goal Formulation: Patient unable to participate in goal setting Time For Goal Achievement: 01/08/22 Potential to Achieve Goals: Fair Progress towards PT goals: Progressing toward goals    Frequency    Min 2X/week      PT Plan Current plan remains appropriate    Co-evaluation PT/OT/SLP Co-Evaluation/Treatment: Yes Reason for Co-Treatment: Complexity of the patient's impairments (multi-system involvement);To address functional/ADL transfers PT goals addressed during session: Mobility/safety with mobility        AM-PAC PT "6 Clicks" Mobility   Outcome Measure  Help needed turning from your back to your side while in a flat bed without using bedrails?: A Lot Help needed moving from lying on your back to sitting on the side of a flat bed without using bedrails?: A Lot Help needed moving to and from a bed to a chair (including a wheelchair)?: Total Help needed standing up from a chair using your arms (e.g., wheelchair or bedside chair)?: Total Help needed to walk in hospital room?: Total Help needed climbing 3-5 steps with a railing? : Total 6 Click Score: 8    End of Session Equipment Utilized During Treatment: Gait belt;Oxygen Activity Tolerance:  (limited by cognition and ability to follow commands) Patient left: in bed;with call  bell/phone within reach;with bed alarm set Nurse Communication: Mobility status PT Visit Diagnosis: Other abnormalities of gait and mobility (R26.89);Muscle weakness (generalized) (M62.81);Unsteadiness on feet (R26.81)     Time: 8676-1950 PT Time Calculation (min) (ACUTE ONLY): 26 min  Charges:  $Therapeutic Activity: 8-22 mins                     [name], PT, MPT    [name] 12/26/2021, 12:10 PM

## 2021-12-26 NOTE — Assessment & Plan Note (Signed)
Patient on multiple antihypertensives with very poor response. Again transferred to stepdown and started on nicardipine infusion. Able to weaned off from nicardipine today. -Continue  minoxidil at 10 mg daily -Continue to monitor and nicardipine infusion.

## 2021-12-26 NOTE — Progress Notes (Signed)
HD end 

## 2021-12-26 NOTE — Assessment & Plan Note (Signed)
Resumed on his home p.o. amlodipine 10 mg daily, clonidine 0.3 mg p.o. 3 times daily, hydralazine 100 mg p.o. 3 times daily, isosorbide mononitrate 60 mg p.o. twice daily, losartan 100 mg daily.  Pressures at time improved, but then blood pressure starts to spike again.  As above, changing ARB and adding alpha-blocker. Able to wean him off from nicardipine today -Nephrology also added clonidine patch along with p.o. clonidine -Continue minoxidil  10 mg daily

## 2021-12-26 NOTE — Progress Notes (Signed)
Patient taken down to [facility] department at this time.  No s.s of distress noted denies pain.

## 2021-12-26 NOTE — Progress Notes (Signed)
Progress Note   Patient: Todd Brown ZOX:[id number] DOB: [birth year] DOA: 12/15/2021     11 DOS: the patient was seen and examined on 12/26/2021   Brief hospital course: 61 year old male with past medical history of hypertension and end-stage renal disease on hemodialysis presented to the emergency room from his group home/skilled nursing facility on 7/17 with confusion.  (Patient's baseline is that he is unable to care for himself and has a legal guardian.)  Patient last received dialysis on Friday, 7/14.  From being in the emergency room on 7/17, unable to get dialysis yesterday.  Patient found to have markedly elevated blood pressure with a systolic in the 811B.  Patient with questionable cardiac ischemia as well.  CT scan of head and neurological exam unremarkable.  Patient was admitted to the hospitalist service and started on his oral antihypertensives, nicardipine drip and given IV labetalol and as needed IV hydralazine.  Nephrology consulted and plans to take patient for dialysis tomorrow.  Attempts to wean off Cardene have led to increasing high blood pressure, compounded by patient at first refusing to take oral medications.  Had become more physically agitated, wanting to be left alone.  Seen by psychiatry and started on scheduled and as needed Haldol.  Has become more compliant and better mentating and taking medications and blood pressure improving.  Also developed some periorbital swelling bilaterally felt to be secondary to subcutaneous emphysema and started on steroids, and periorbital swelling has since resolved.  Patient was supposed to be discharged back to his assisted living facility on 7/21 after dialysis.  His baseline is that he is able to sit in a chair for dialysis although some assistance.  And trying to get him out of bed and placed in a wheelchair, took 3 people and still patient unable to sit and stay in chair.  Discharge canceled and plan is for patient to have PT/OT  evaluation and possible skilled nursing prior to returning to assisted living.  7/24: PT and OT are recommending rehab.  TOC is working on it. Found to have some oral thrush.  Started on Magic mouthwash and prednisone was discontinued. Blood pressure remained elevated, neurology started on clonidine patch along with current multiple antihypertensives.  Had his dialysis today.  7/25: Patient was transferred to stepdown and started on nicardipine infusion due to persistently elevated blood pressure last night.  Cardene infusion was stopped at 9:30 AM when blood pressure improved to 147 systolic, have to restart as it started rising significantly despite getting all the scheduled antihypertensives. Remained quite somnolent.  Apparently was getting scheduled Benadryl which was made as needed.  Scheduled daily Haldol was also made as needed to see if that will affect his excessive sleep.  7/26: Blood pressure dropped during dialysis and nicardipine drip was discontinued.  Later started trending up.  He was also started on minoxidil at 5 mg daily and dose will be titrated. Little more alert and less somnolence after stopping scheduled Benadryl and Haldol yesterday.  7/27: Patient more alert and interactive.  Blood pressure remained elevated with multiple failed attempts to wean off nicardipine infusion.  Minoxidil dose was increased to 10 mg today.  7/28: Patient remained stable with improvement in blood pressure.  Weaned off from nicardipine infusion.  Going for dialysis today.   Assessment and Plan: * Acute metabolic encephalopathy Waxing and weaning.  Suspecting related to elevated blood pressure. Increased somnolence seems improved after stopping scheduled Benadryl and Haldol.  Blood pressure still a challenge to control. -  Continue to monitor  Hypertensive emergency Patient on multiple antihypertensives with very poor response. Again transferred to stepdown and started on nicardipine  infusion. Able to weaned off from nicardipine today. -Continue  minoxidil at 10 mg daily -Continue to monitor and nicardipine infusion.  ESRD on dialysis [facility]) Nephrology consulted, full dialysis session following admission and again today to keep him on his Monday Wednesday Friday schedule. -Continue scheduled dialysis  Mental health disorder Unspecified.  Patient is unable to care for himself.  It is unclear if he is ever had a formal diagnosis.  He is on Tegretol and Artane which he should continue.  As needed Haldol for acute behavior agitation as above.  Essential hypertension Resumed on his home p.o. amlodipine 10 mg daily, clonidine 0.3 mg p.o. 3 times daily, hydralazine 100 mg p.o. 3 times daily, isosorbide mononitrate 60 mg p.o. twice daily, losartan 100 mg daily.  Pressures at time improved, but then blood pressure starts to spike again.  As above, changing ARB and adding alpha-blocker. Able to wean him off from nicardipine today -Nephrology also added clonidine patch along with p.o. clonidine -Continue minoxidil  10 mg daily   Oral thrush Patient was found to have oral thrush today.  He was on high-dose steroid, which were discontinued. -Magic mouthwash.  Subcutaneous emphysema (HCC)-resolved as of 12/18/2021 On steroids.  Appreciate pulmonary assistance.  Subcutaneous emphysema looks to be resolved.  Changed from Solu-Medrol to p.o. prednisone quick taper.  Physical deconditioning PT OT are recommending SNF. TOC to work on placement  Unspecified protein-calorie malnutrition ([place]) Estimated body mass index is 21.08 kg/m as calculated from the following:   Height as of this encounter: 6' (1.829 m).   Weight as of this encounter: 70.5 kg.   Patient was found to have moderate degree malnourishment. -Dietitian consult     Subjective: Patient was sitting in chair when seen today.  Ate his breakfast.  More alert and interactive and appears to be at his baseline.  Off from  the nicardipine drip  Physical Exam: Vitals:   12/26/21 1303 12/26/21 1318 12/26/21 1340 12/26/21 1400  BP: 113/69 139/69 111/66 (!) 112/59  Pulse: 80 66 67   Resp: 18 12 12    Temp:      TempSrc:      SpO2: 96% 98% 96% 95%  Weight:      Height:       General.  Cognitively impaired gentleman, in no acute distress. Pulmonary.  Lungs clear bilaterally, normal respiratory effort. CV.  Regular rate and rhythm, no JVD, rub or murmur. Abdomen.  Soft, nontender, nondistended, BS positive. CNS.  Alert and interactive. Extremities.  No edema, no cyanosis, pulses intact and symmetrical. Psychiatry.  Judgment and insight appears impaired.  Data Reviewed: Prior data reviewed  Family Communication: Patient had guardian  Disposition: Status is: Inpatient Remains inpatient appropriate because: Severity of illness, need placement   Planned Discharge Destination: Skilled nursing facility  DVT prophylaxis.  Subcu heparin Time spent: 45 minutes  This record has been created using Systems analyst. Errors have been sought and corrected,but may not always be located. Such creation errors do not reflect on the standard of care.  Author: Lorella Nimrod, MD 12/26/2021 2:14 PM  For on call review [email or web address].

## 2021-12-27 DIAGNOSIS — G9341 Metabolic encephalopathy: Secondary | ICD-10-CM | POA: Diagnosis not present

## 2021-12-27 MED ORDER — CALCIUM ACETATE (PHOS BINDER) 667 MG PO CAPS
667.0000 mg | ORAL_CAPSULE | Freq: Three times a day (TID) | ORAL | Status: DC
  Administered 2021-12-27 – 2022-02-05 (×107): 667 mg via ORAL
  Filled 2021-12-27 (×106): qty 1

## 2021-12-27 MED ORDER — EPOETIN ALFA 4000 UNIT/ML IJ SOLN: 4000.0000 [IU] | INTRAMUSCULAR | Status: DC

## 2021-12-27 MED ORDER — CALCIUM CARBONATE ANTACID 500 MG PO CHEW: 1.0000 | CHEWABLE_TABLET | Freq: Once | ORAL | Status: DC

## 2021-12-27 NOTE — Progress Notes (Signed)
[facility]  ROUNDING NOTE   Subjective:   Todd Brown is a 61 year old male with past medical history including hypertension, anemia, schizophrenia and end-stage renal disease on hemodialysis.  Patient has presented to the ED with altered mental status.  Patient has been admitted for Altered mental status [R41.82] Hypertensive urgency [I16.0] Severe hypertension [I10] Altered mental status, unspecified altered mental status type [R41.82]  Patient is known to our practice and receives outpatient dialysis treatments at [facility] on a MWF schedule.  Update  Patient resting comfortably Alert, oriented to self Room air Denies nausea.  Eating breakfast when seen. Hemodialysis completed on Friday.  500 cc of fluid was removed.   Objective:  Vital signs in last 24 hours:  Temp:  [98 F (36.7 C)-98.5 F (36.9 C)] 98.1 F (36.7 C) (07/29 0756) Pulse Rate:  [66-87] 81 (07/29 0756) Resp:  [11-19] 18 (07/29 0756) BP: (109-172)/(55-120) 140/76 (07/29 0756) SpO2:  [93 %-100 %] 94 % (07/29 0756)  Weight change:  Filed Weights   12/22/21 2130 12/24/21 0815 12/24/21 1251  Weight: 71 kg 72.5 kg 71.9 kg    Intake/Output: I/O last 3 completed shifts: In: 360 [P.O.:360] Out: 500 [Other:500]   Intake/Output this shift:  No intake/output data recorded.  Physical Exam: General: NAD  Head: Normocephalic, atraumatic. Moist oral mucosal membranes  Eyes: Anicteric  Lungs:  Clear to auscultation, normal effort, room air  Heart: Regular rate and rhythm, + gallop  Abdomen:  Soft, nontender, non distended  Extremities:  No peripheral edema.  Neurologic: Alert, answer simple questions  Skin: No lesions  Access: Lt AVF    Basic Metabolic Panel: Recent Labs  Lab 12/22/21 0734 12/24/21 0700 12/26/21 1310  NA 133* 133* 135  K 4.3 4.1 4.5  CL 94* 96* 95*  CO2 27 27 31   GLUCOSE 59* 90 90  BUN 49* 38* 43*  CREATININE 6.40* 5.60* 5.12*  CALCIUM 9.5 9.2 9.5   PHOS  --  2.5 1.8*     Liver Function Tests: Recent Labs  Lab 12/24/21 0700 12/26/21 1310  ALBUMIN 3.3* 3.2*    No results for input(s): "LIPASE", "AMYLASE" in the last 168 hours. No results for input(s): "AMMONIA" in the last 168 hours.  CBC: Recent Labs  Lab 12/22/21 0734 12/24/21 0700 12/26/21 1310  WBC 4.4 4.1 4.6  NEUTROABS 3.0 2.9  --   HGB 10.5* 10.1* 9.2*  HCT 29.7* 29.4* 26.6*  MCV 93.4 95.1 96.0  PLT 155 147* 154     Cardiac Enzymes: No results for input(s): "CKTOTAL", "CKMB", "CKMBINDEX", "TROPONINI" in the last 168 hours.  BNP: Invalid input(s): "POCBNP"  CBG: Recent Labs  Lab 12/22/21 2132  GLUCAP 103*     Microbiology: Results for orders placed or performed during the hospital encounter of 12/15/21  SARS Coronavirus 2 by RT PCR (hospital order, performed in [facility] hospital lab) *cepheid single result test* Anterior Nasal Swab     Status: None   Collection Time: 12/15/21  5:50 PM   Specimen: Anterior Nasal Swab  Result Value Ref Range Status   SARS Coronavirus 2 by RT PCR NEGATIVE NEGATIVE Final    Comment: (NOTE) SARS-CoV-2 target nucleic acids are NOT DETECTED.  The SARS-CoV-2 RNA is generally detectable in upper and lower respiratory specimens during the acute phase of infection. The lowest concentration of SARS-CoV-2 viral copies this assay can detect is 250 copies / mL. A negative result does not preclude SARS-CoV-2 infection and should not be used as  the sole basis for treatment or other patient management decisions.  A negative result may occur with improper specimen collection / handling, submission of specimen other than nasopharyngeal swab, presence of viral mutation(s) within the areas targeted by this assay, and inadequate number of viral copies (<250 copies / mL). A negative result must be combined with clinical observations, patient history, and epidemiological information.  Fact Sheet for Patients:    https://www.patel.info/  Fact Sheet for Healthcare Providers: https://hall.com/  This test is not yet approved or  cleared by the [place] FDA and has been authorized for detection and/or diagnosis of SARS-CoV-2 by FDA under an Emergency Use Authorization (EUA).  This EUA will remain in effect (meaning this test can be used) for the duration of the COVID-19 declaration under Section 564(b)(1) of the Act, 21 U.S.C. section 360bbb-3(b)(1), unless the authorization is terminated or revoked sooner.  Performed at [facility], [address] [place], [place] [address]   MRSA Next Gen by PCR, Nasal     Status: None   Collection Time: 12/15/21  9:03 PM   Specimen: Nasal Mucosa; Nasal Swab  Result Value Ref Range Status   MRSA by PCR Next Gen NOT DETECTED NOT DETECTED Final    Comment: (NOTE) The GeneXpert MRSA Assay (FDA approved for NASAL specimens only), is one component of a comprehensive MRSA colonization surveillance program. It is not intended to diagnose MRSA infection nor to guide or monitor treatment for MRSA infections. Test performance is not FDA approved in patients less than 29 years old. Performed at [facility], [address] [place], [place] [address]     Coagulation Studies: No results for input(s): "LABPROT", "INR" in the last 72 hours.  Urinalysis: No results for input(s): "COLORURINE", "LABSPEC", "PHURINE", "GLUCOSEU", "HGBUR", "BILIRUBINUR", "KETONESUR", "PROTEINUR", "UROBILINOGEN", "NITRITE", "LEUKOCYTESUR" in the last 72 hours.  Invalid input(s): "APPERANCEUR"    Imaging: No results found.   Medications:    anticoagulant sodium citrate     niCARDipine Stopped (12/25/21 1121)    (feeding supplement) PROSource Plus  30 mL Oral TID BM   amLODipine  10 mg Oral q morning   calcium acetate  1,334 mg Oral TID WC   calcium carbonate  1 tablet Oral Once   carbamazepine  1,000 mg Oral  q AM   Chlorhexidine Gluconate Cloth  6 each Topical Daily   cloNIDine  0.2 mg Transdermal Weekly   doxazosin  4 mg Oral QHS   feeding supplement (NEPRO CARB STEADY)  237 mL Oral TID BM   ferrous sulfate  325 mg Oral Q breakfast   fluPHENAZine  2.5 mg Oral q AM   heparin  5,000 Units Subcutaneous Q8H   hydrALAZINE  100 mg Oral TID   irbesartan  300 mg Oral Daily   isosorbide mononitrate  60 mg Oral BID   magic mouthwash  10 mL Oral QID   minoxidil  10 mg Oral Daily   mometasone-formoterol  2 puff Inhalation BID   multivitamin  1 tablet Oral QHS   pantoprazole  40 mg Oral Daily   polyethylene glycol  17 g Oral BID   trihexyphenidyl  2 mg Oral BID WC   albuterol, alteplase, anticoagulant sodium citrate, diphenhydrAMINE, haloperidol lactate, heparin, hydrALAZINE, lidocaine (PF), lidocaine-prilocaine, ondansetron **OR** ondansetron (ZOFRAN) IV, mouth rinse, pentafluoroprop-tetrafluoroeth, polyethylene glycol, senna-docusate  Assessment/ Plan:  Mr. Raydin Bielinski is a 61 y.o.  male with past medical history including hypertension, anemia, schizophrenia and end-stage renal disease on hemodialysis.  Patient has presented to the ED with altered mental status.  Patient has been admitted for Altered mental status [R41.82] Hypertensive urgency [I16.0] Severe hypertension [I10] Altered mental status, unspecified altered mental status type [R41.82]  [facility]/MWF/Lt AVF  End-stage renal disease on hemodialysis.  Will maintain outpatient schedule if possible.  Next hemodialysis treatment planned for Monday.     2. Anemia of chronic kidney disease Lab Results  Component Value Date   HGB 9.2 (L) 12/26/2021    Hgb within desired range. Mircera prescribed outpatient.   Low-dose Epogen with hemodialysis.  3. Secondary Hyperparathyroidism:  Lab Results  Component Value Date   CALCIUM 9.5 12/26/2021   PHOS 1.8 (L) 12/26/2021   Phosphorus level is low yesterday.  We will decrease  the dose of calcium acetate binder.  4.  Hypertension with chronic kidney disease.  Home regimen includes amlodipine, clonidine, hydralazine, isosorbide, and losartan.    Currently on amlodipine 10 mg daily, clonidine patch 0.2 mg per 24 hours, Cardura 4 mg daily at bedtime, hydralazine 100 mg 3 times per day, Avapro 300 mg daily, isosorbide 60 mg twice a day, minoxidil 10 mg daily.     LOS: 12 Laquanna Veazey 7/29/202311:09 AM

## 2021-12-27 NOTE — Assessment & Plan Note (Signed)
Resumed on his home p.o. amlodipine 10 mg daily, clonidine 0.3 mg p.o. 3 times daily, hydralazine 100 mg p.o. 3 times daily, isosorbide mononitrate 60 mg p.o. twice daily, losartan 100 mg daily.  Pressures at time improved, but then blood pressure starts to spike again.  As above, changing ARB and adding alpha-blocker. Able to wean him off from nicardipine and blood pressure remained within susceptible range. -Nephrology also added clonidine patch along with p.o. clonidine -Continue minoxidil  10 mg daily

## 2021-12-27 NOTE — Plan of Care (Signed)

## 2021-12-27 NOTE — Assessment & Plan Note (Signed)
Patient on multiple antihypertensives with very poor response. Transferred out of stepdown and blood pressure currently within acceptable range on current regimen. -Continue home antihypertensives -Continue  minoxidil at 10 mg daily -Continue to monitor and nicardipine infusion.

## 2021-12-27 NOTE — Progress Notes (Signed)
Progress Note   Patient: Todd Brown GDJ:[id number] DOB: 1961-02-26 DOA: 12/15/2021     12 DOS: the patient was seen and examined on 12/27/2021   Brief hospital course: 61 year old male with past medical history of hypertension and end-stage renal disease on hemodialysis presented to the emergency room from his group home/skilled nursing facility on 7/17 with confusion.  (Patient's baseline is that he is unable to care for himself and has a legal guardian.)  Patient last received dialysis on Friday, 7/14.  From being in the emergency room on 7/17, unable to get dialysis yesterday.  Patient found to have markedly elevated blood pressure with a systolic in the 622W.  Patient with questionable cardiac ischemia as well.  CT scan of head and neurological exam unremarkable.  Patient was admitted to the hospitalist service and started on his oral antihypertensives, nicardipine drip and given IV labetalol and as needed IV hydralazine.  Nephrology consulted and plans to take patient for dialysis tomorrow.  Attempts to wean off Cardene have led to increasing high blood pressure, compounded by patient at first refusing to take oral medications.  Had become more physically agitated, wanting to be left alone.  Seen by psychiatry and started on scheduled and as needed Haldol.  Has become more compliant and better mentating and taking medications and blood pressure improving.  Also developed some periorbital swelling bilaterally felt to be secondary to subcutaneous emphysema and started on steroids, and periorbital swelling has since resolved.  Patient was supposed to be discharged back to his assisted living facility on 7/21 after dialysis.  His baseline is that he is able to sit in a chair for dialysis although some assistance.  And trying to get him out of bed and placed in a wheelchair, took 3 people and still patient unable to sit and stay in chair.  Discharge canceled and plan is for patient to have PT/OT  evaluation and possible skilled nursing prior to returning to assisted living.  7/24: PT and OT are recommending rehab.  TOC is working on it. Found to have some oral thrush.  Started on Magic mouthwash and prednisone was discontinued. Blood pressure remained elevated, neurology started on clonidine patch along with current multiple antihypertensives.  Had his dialysis today.  7/25: Patient was transferred to stepdown and started on nicardipine infusion due to persistently elevated blood pressure last night.  Cardene infusion was stopped at 9:30 AM when blood pressure improved to 979 systolic, have to restart as it started rising significantly despite getting all the scheduled antihypertensives. Remained quite somnolent.  Apparently was getting scheduled Benadryl which was made as needed.  Scheduled daily Haldol was also made as needed to see if that will affect his excessive sleep.  7/26: Blood pressure dropped during dialysis and nicardipine drip was discontinued.  Later started trending up.  He was also started on minoxidil at 5 mg daily and dose will be titrated. Little more alert and less somnolence after stopping scheduled Benadryl and Haldol yesterday.  7/27: Patient more alert and interactive.  Blood pressure remained elevated with multiple failed attempts to wean off nicardipine infusion.  Minoxidil dose was increased to 10 mg today.  7/28: Patient remained stable with improvement in blood pressure.  Weaned off from nicardipine infusion.  Going for dialysis today.  7/29: BP seems improving,quite alert and interactive, at baseline. Waiting for SNF.    Assessment and Plan: * Acute metabolic encephalopathy Waxing and weaning.  Suspecting related to elevated blood pressure. Increased somnolence seems improved  after stopping scheduled Benadryl and Haldol.  Blood pressure still a challenge to control. -Continue to monitor  Hypertensive emergency Patient on multiple antihypertensives with  very poor response. Transferred out of stepdown and blood pressure currently within acceptable range on current regimen. -Continue home antihypertensives -Continue  minoxidil at 10 mg daily -Continue to monitor and nicardipine infusion.  ESRD on dialysis [facility]) Nephrology consulted, full dialysis session following admission and again today to keep him on his Monday Wednesday Friday schedule. -Continue scheduled dialysis  Mental health disorder Unspecified.  Patient is unable to care for himself.  It is unclear if he is ever had a formal diagnosis.  He is on Tegretol and Artane which he should continue.  As needed Haldol for acute behavior agitation as above.  Essential hypertension Resumed on his home p.o. amlodipine 10 mg daily, clonidine 0.3 mg p.o. 3 times daily, hydralazine 100 mg p.o. 3 times daily, isosorbide mononitrate 60 mg p.o. twice daily, losartan 100 mg daily.  Pressures at time improved, but then blood pressure starts to spike again.  As above, changing ARB and adding alpha-blocker. Able to wean him off from nicardipine and blood pressure remained within susceptible range. -Nephrology also added clonidine patch along with p.o. clonidine -Continue minoxidil  10 mg daily   Oral thrush Patient was found to have oral thrush today.  He was on high-dose steroid, which were discontinued. -Magic mouthwash.  Subcutaneous emphysema (HCC)-resolved as of 12/18/2021 On steroids.  Appreciate pulmonary assistance.  Subcutaneous emphysema looks to be resolved.  Changed from Solu-Medrol to p.o. prednisone quick taper.  Physical deconditioning PT OT are recommending SNF. TOC to work on placement  Unspecified protein-calorie malnutrition ([place]) Estimated body mass index is 21.08 kg/m as calculated from the following:   Height as of this encounter: 6' (1.829 m).   Weight as of this encounter: 70.5 kg.   Patient was found to have moderate degree malnourishment. -Dietitian consult       Subjective: Patient was sitting comfortably and eating breakfast with nursing tech help when seen today.  Denies any complaints.  Physical Exam: Vitals:   12/26/21 2025 12/27/21 0644 12/27/21 0756 12/27/21 1526  BP: 137/69 (!) 172/89 140/76 (!) 156/74  Pulse: 87 83 81 75  Resp: 18 18 18 16   Temp: 98 F (36.7 C) 98.3 F (36.8 C) 98.1 F (36.7 C) 97.9 F (36.6 C)  TempSrc: Oral Oral    SpO2: 93% 94% 94% 100%  Weight:      Height:       General.  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 Pulmonary.  Lungs clear bilaterally, normal respiratory effort. CV.  Regular rate and rhythm, no JVD, rub or murmur. Abdomen.  Soft, nontender, nondistended, BS positive. CNS.  Alert and oriented .  No focal neurologic deficit. Extremities.  No edema, no cyanosis, pulses intact and symmetrical. Psychiatry.  Judgment and insight appears impaired.  Data Reviewed: Prior data reviewed  Family Communication: Patient has a legal guardian  Disposition: Status is: Inpatient Remains inpatient appropriate because: Severity of illness, need placement   Planned Discharge Destination: Skilled nursing facility  DVT prophylaxis.  Subcu heparin Time spent: 42 minutes  This record has been created using Systems analyst. Errors have been sought and corrected,but may not always be located. Such creation errors do not reflect on the standard of care.  Author: Lorella Nimrod, MD 12/27/2021 4:14 PM  For on call review [email or web address].

## 2021-12-28 DIAGNOSIS — G9341 Metabolic encephalopathy: Secondary | ICD-10-CM | POA: Diagnosis not present

## 2021-12-28 NOTE — Progress Notes (Signed)
Progress Note   Patient: Todd Brown JIR:[id number] DOB: 1960/07/10 DOA: 12/15/2021     13 DOS: the patient was seen and examined on 12/28/2021   Brief hospital course: 61 year old male with past medical history of hypertension and end-stage renal disease on hemodialysis presented to the emergency room from his group home/skilled nursing facility on 7/17 with confusion.  (Patient's baseline is that he is unable to care for himself and has a legal guardian.)  Patient last received dialysis on Friday, 7/14.  From being in the emergency room on 7/17, unable to get dialysis yesterday.  Patient found to have markedly elevated blood pressure with a systolic in the 751W.  Patient with questionable cardiac ischemia as well.  CT scan of head and neurological exam unremarkable.  Patient was admitted to the hospitalist service and started on his oral antihypertensives, nicardipine drip and given IV labetalol and as needed IV hydralazine.  Nephrology consulted and plans to take patient for dialysis tomorrow.  Attempts to wean off Cardene have led to increasing high blood pressure, compounded by patient at first refusing to take oral medications.  Had become more physically agitated, wanting to be left alone.  Seen by psychiatry and started on scheduled and as needed Haldol.  Has become more compliant and better mentating and taking medications and blood pressure improving.  Also developed some periorbital swelling bilaterally felt to be secondary to subcutaneous emphysema and started on steroids, and periorbital swelling has since resolved.  Patient was supposed to be discharged back to his assisted living facility on 7/21 after dialysis.  His baseline is that he is able to sit in a chair for dialysis although some assistance.  And trying to get him out of bed and placed in a wheelchair, took 3 people and still patient unable to sit and stay in chair.  Discharge canceled and plan is for patient to have PT/OT  evaluation and possible skilled nursing prior to returning to assisted living.  7/24: PT and OT are recommending rehab.  TOC is working on it. Found to have some oral thrush.  Started on Magic mouthwash and prednisone was discontinued. Blood pressure remained elevated, neurology started on clonidine patch along with current multiple antihypertensives.  Had his dialysis today.  7/25: Patient was transferred to stepdown and started on nicardipine infusion due to persistently elevated blood pressure last night.  Cardene infusion was stopped at 9:30 AM when blood pressure improved to 258 systolic, have to restart as it started rising significantly despite getting all the scheduled antihypertensives. Remained quite somnolent.  Apparently was getting scheduled Benadryl which was made as needed.  Scheduled daily Haldol was also made as needed to see if that will affect his excessive sleep.  7/26: Blood pressure dropped during dialysis and nicardipine drip was discontinued.  Later started trending up.  He was also started on minoxidil at 5 mg daily and dose will be titrated. Little more alert and less somnolence after stopping scheduled Benadryl and Haldol yesterday.  7/27: Patient more alert and interactive.  Blood pressure remained elevated with multiple failed attempts to wean off nicardipine infusion.  Minoxidil dose was increased to 10 mg today.  7/28: Patient remained stable with improvement in blood pressure.  Weaned off from nicardipine infusion.  Going for dialysis today.  7/29: BP seems improving,quite alert and interactive, at baseline. Waiting for SNF.   7/30: Blood pressure still elevated, although improved than before.  Requiring as needed medications.  Otherwise seems stable and at baseline.  Assessment and Plan: * Acute metabolic encephalopathy Waxing and weaning.  Suspecting related to elevated blood pressure. Increased somnolence seems improved after stopping scheduled Benadryl and  Haldol.  Blood pressure still a challenge to control. -Continue to monitor  Hypertensive emergency Patient on multiple antihypertensives with very poor response. Transferred out of stepdown and blood pressure currently within acceptable range on current regimen. -Continue home antihypertensives -Continue  minoxidil at 10 mg daily -Continue to monitor and nicardipine infusion.  ESRD on dialysis [facility]) Nephrology consulted, full dialysis session following admission and again today to keep him on his Monday Wednesday Friday schedule. -Continue scheduled dialysis  Mental health disorder Unspecified.  Patient is unable to care for himself.  It is unclear if he is ever had a formal diagnosis.  He is on Tegretol and Artane which he should continue.  As needed Haldol for acute behavior agitation as above.  Essential hypertension Resumed on his home p.o. amlodipine 10 mg daily, clonidine 0.3 mg p.o. 3 times daily, hydralazine 100 mg p.o. 3 times daily, isosorbide mononitrate 60 mg p.o. twice daily, losartan 100 mg daily.  Pressures at time improved, but then blood pressure starts to spike again.  As above, changing ARB and adding alpha-blocker. Able to wean him off from nicardipine and blood pressure remained within susceptible range. -Nephrology also added clonidine patch along with p.o. clonidine -Continue minoxidil  10 mg daily   Oral thrush Patient was found to have oral thrush today.  He was on high-dose steroid, which were discontinued. -Magic mouthwash.  Subcutaneous emphysema (HCC)-resolved as of 12/18/2021 On steroids.  Appreciate pulmonary assistance.  Subcutaneous emphysema looks to be resolved.  Changed from Solu-Medrol to p.o. prednisone quick taper.  Physical deconditioning PT OT are recommending SNF. TOC to work on placement  Unspecified protein-calorie malnutrition ([place]) Estimated body mass index is 21.08 kg/m as calculated from the following:   Height as of this encounter:  6' (1.829 m).   Weight as of this encounter: 70.5 kg.   Patient was found to have moderate degree malnourishment. -Dietitian consult     Subjective: Patient was seen and examined today.  Resting comfortably.  No new complaints.  Physical Exam: Vitals:   12/28/21 0003 12/28/21 0506 12/28/21 0706 12/28/21 0820  BP: (!) 152/78 (!) 171/97 (!) 157/81 (!) 181/84  Pulse: 80 70 79 85  Resp: 18 18  18   Temp: 98 F (36.7 C) 98.6 F (37 C)  98.1 F (36.7 C)  TempSrc: Oral     SpO2: 96% 97%  95%  Weight:      Height:       General.  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 Pulmonary.  Lungs clear bilaterally, normal respiratory effort. CV.  Regular rate and rhythm, no JVD, rub or murmur. Abdomen.  Soft, nontender, nondistended, BS positive. CNS.  Alert and oriented .  No focal neurologic deficit. Extremities.  No edema, no cyanosis, pulses intact and symmetrical. Psychiatry.  Judgment and insight appears impaired.  Data Reviewed: Prior data reviewed  Family Communication: Patient has a legal guardian  Disposition: Status is: Inpatient Remains inpatient appropriate because: Severity of illness, difficult to control blood pressure and waiting for placement   Planned Discharge Destination: Skilled nursing facility  DVT prophylaxis.  Subcu heparin Time spent: 43 minutes  This record has been created using Systems analyst. Errors have been sought and corrected,but may not always be located. Such creation errors do not reflect on the standard of care.  Author: Lorella Nimrod, MD 12/28/2021 2:47 PM  For on call review [email or web address].

## 2021-12-28 NOTE — TOC Progression Note (Signed)
Transition of Care [facility]) - Progression Note    Patient Details  Name: Todd Brown MRN: [id number] Date of Birth: May 20, 1961  Transition of Care [facility]) CM/SW Contact  Izola Price, RN Phone Number: 12/28/2021, 4:14 PM  Clinical Narrative: 7/30: Bed searches in Consideration or Pending status still.  Simmie Davies RN CM 415 pm.     Expected Discharge Plan: Skilled Nursing Facility Barriers to Discharge: Continued Medical Work up  Expected Discharge Plan and Services Expected Discharge Plan: [place] arrangements for the past 2 months: [place] Expected Discharge Date: 12/19/21                                     Social Determinants of Health (SDOH) Interventions    Readmission Risk Interventions     No data to display

## 2021-12-28 NOTE — Assessment & Plan Note (Signed)
Resumed on his home p.o. amlodipine 10 mg daily, clonidine 0.3 mg p.o. 3 times daily, hydralazine 100 mg p.o. 3 times daily, isosorbide mononitrate 60 mg p.o. twice daily, losartan 100 mg daily.  Pressures at time improved, but then blood pressure starts to spike again.  As above, changing ARB and adding alpha-blocker. Able to wean him off from nicardipine and blood pressure remained within susceptible range. -Nephrology also added clonidine patch along with p.o. clonidine -Continue minoxidil  10 mg daily

## 2021-12-29 DIAGNOSIS — G9341 Metabolic encephalopathy: Secondary | ICD-10-CM | POA: Diagnosis not present

## 2021-12-29 LAB — RENAL FUNCTION PANEL
Albumin: 3 g/dL — ABNORMAL LOW (ref 3.5–5.0)
Anion gap: 8 (ref 5–15)
BUN: 48 mg/dL — ABNORMAL HIGH (ref 8–23)
CO2: 29 mmol/L (ref 22–32)
Calcium: 9.6 mg/dL (ref 8.9–10.3)
Chloride: 96 mmol/L — ABNORMAL LOW (ref 98–111)
Creatinine, Ser: 5.81 mg/dL — ABNORMAL HIGH (ref 0.61–1.24)
GFR, Estimated: 10 mL/min — ABNORMAL LOW (ref >60)
Glucose, Bld: 73 mg/dL (ref 70–99)
Phosphorus: 2.1 mg/dL — ABNORMAL LOW (ref 2.5–4.6)
Potassium: 5.8 mmol/L — ABNORMAL HIGH (ref 3.5–5.1)
Sodium: 133 mmol/L — ABNORMAL LOW (ref 135–145)

## 2021-12-29 LAB — CBC
HCT: 24.2 % — ABNORMAL LOW (ref 39.0–52.0)
Hemoglobin: 8.2 g/dL — ABNORMAL LOW (ref 13.0–17.0)
MCH: 32.9 pg (ref 26.0–34.0)
MCHC: 33.9 g/dL (ref 30.0–36.0)
MCV: 97.2 fL (ref 80.0–100.0)
Platelets: 178 10*3/uL (ref 150–400)
RBC: 2.49 MIL/uL — ABNORMAL LOW (ref 4.22–5.81)
RDW: 14 % (ref 11.5–15.5)
WBC: 3.6 10*3/uL — ABNORMAL LOW (ref 4.0–10.5)
nRBC: 0 % (ref 0.0–0.2)

## 2021-12-29 MED ORDER — DIPHENHYDRAMINE HCL 50 MG/ML IJ SOLN
INTRAMUSCULAR | Status: AC
  Filled 2021-12-29: qty 1

## 2021-12-29 MED ORDER — EPOETIN ALFA 4000 UNIT/ML IJ SOLN
INTRAMUSCULAR | Status: AC
  Administered 2021-12-29: 4000 [IU] via INTRAVENOUS
  Filled 2021-12-29: qty 1

## 2021-12-29 NOTE — Progress Notes (Signed)
Pre HD RN assessment 

## 2021-12-29 NOTE — Assessment & Plan Note (Signed)
Resumed on his home p.o. amlodipine 10 mg daily, clonidine 0.3 mg p.o. 3 times daily, hydralazine 100 mg p.o. 3 times daily, isosorbide mononitrate 60 mg p.o. twice daily, losartan 100 mg daily.  Pressures at time improved, but then blood pressure starts to spike again.  As above, changing ARB and adding alpha-blocker. Able to wean him off from nicardipine and blood pressure remained within susceptible range. -Continue with current regimen

## 2021-12-29 NOTE — Progress Notes (Signed)
[facility]  ROUNDING NOTE   Subjective:   Todd Brown is a 61 year old male with past medical history including hypertension, anemia, schizophrenia and end-stage renal disease on hemodialysis.  Patient has presented to the ED with altered mental status.  Patient has been admitted for Altered mental status [R41.82] Hypertensive urgency [I16.0] Severe hypertension [I10] Altered mental status, unspecified altered mental status type [R41.82]  Patient is known to our practice and receives outpatient dialysis treatments at [facility] on a MWF schedule.  Update  Patient seen resting quietly Breakfast tray at bedside, patient requires assistance with meals Remains on room air Denies pain or discomfort  Objective:  Vital signs in last 24 hours:  Temp:  [98.3 F (36.8 C)-99.9 F (37.7 C)] 98.3 F (36.8 C) (07/31 0938) Pulse Rate:  [65-82] 65 (07/31 0938) Resp:  [17-20] 17 (07/31 0938) BP: (115-155)/(64-80) 155/69 (07/31 0938) SpO2:  [86 %-100 %] 98 % (07/31 0938)  Weight change:  Filed Weights   12/22/21 2130 12/24/21 0815 12/24/21 1251  Weight: 71 kg 72.5 kg 71.9 kg    Intake/Output: I/O last 3 completed shifts: In: 236 [P.O.:236] Out: -    Intake/Output this shift:  Total I/O In: 120 [P.O.:120] Out: -   Physical Exam: General: NAD  Head: Normocephalic, atraumatic. Moist oral mucosal membranes  Eyes: Anicteric  Lungs:  Clear to auscultation, normal effort, room air  Heart: Regular rate and rhythm, + gallop  Abdomen:  Soft, nontender, non distended  Extremities:  No peripheral edema.  Neurologic: Alert, answer simple questions  Skin: No lesions  Access: Lt AVF    Basic Metabolic Panel: Recent Labs  Lab 12/24/21 0700 12/26/21 1310 12/29/21 0846  NA 133* 135 133*  K 4.1 4.5 5.8*  CL 96* 95* 96*  CO2 27 31 29   GLUCOSE 90 90 73  BUN 38* 43* 48*  CREATININE 5.60* 5.12* 5.81*  CALCIUM 9.2 9.5 9.6  PHOS 2.5 1.8* 2.1*     Liver  Function Tests: Recent Labs  Lab 12/24/21 0700 12/26/21 1310 12/29/21 0846  ALBUMIN 3.3* 3.2* 3.0*    No results for input(s): "LIPASE", "AMYLASE" in the last 168 hours. No results for input(s): "AMMONIA" in the last 168 hours.  CBC: Recent Labs  Lab 12/24/21 0700 12/26/21 1310 12/29/21 0846  WBC 4.1 4.6 3.6*  NEUTROABS 2.9  --   --   HGB 10.1* 9.2* 8.2*  HCT 29.4* 26.6* 24.2*  MCV 95.1 96.0 97.2  PLT 147* 154 178     Cardiac Enzymes: No results for input(s): "CKTOTAL", "CKMB", "CKMBINDEX", "TROPONINI" in the last 168 hours.  BNP: Invalid input(s): "POCBNP"  CBG: Recent Labs  Lab 12/22/21 2132  GLUCAP 103*     Microbiology: Results for orders placed or performed during the hospital encounter of 12/15/21  SARS Coronavirus 2 by RT PCR (hospital order, performed in [facility] hospital lab) *cepheid single result test* Anterior Nasal Swab     Status: None   Collection Time: 12/15/21  5:50 PM   Specimen: Anterior Nasal Swab  Result Value Ref Range Status   SARS Coronavirus 2 by RT PCR NEGATIVE NEGATIVE Final    Comment: (NOTE) SARS-CoV-2 target nucleic acids are NOT DETECTED.  The SARS-CoV-2 RNA is generally detectable in upper and lower respiratory specimens during the acute phase of infection. The lowest concentration of SARS-CoV-2 viral copies this assay can detect is 250 copies / mL. A negative result does not preclude SARS-CoV-2 infection and should not be used as  the sole basis for treatment or other patient management decisions.  A negative result may occur with improper specimen collection / handling, submission of specimen other than nasopharyngeal swab, presence of viral mutation(s) within the areas targeted by this assay, and inadequate number of viral copies (<250 copies / mL). A negative result must be combined with clinical observations, patient history, and epidemiological information.  Fact Sheet for Patients:    https://www.patel.info/  Fact Sheet for Healthcare Providers: https://hall.com/  This test is not yet approved or  cleared by the [place] FDA and has been authorized for detection and/or diagnosis of SARS-CoV-2 by FDA under an Emergency Use Authorization (EUA).  This EUA will remain in effect (meaning this test can be used) for the duration of the COVID-19 declaration under Section 564(b)(1) of the Act, 21 U.S.C. section 360bbb-3(b)(1), unless the authorization is terminated or revoked sooner.  Performed at [facility], [address] [place], [place] [address]   MRSA Next Gen by PCR, Nasal     Status: None   Collection Time: 12/15/21  9:03 PM   Specimen: Nasal Mucosa; Nasal Swab  Result Value Ref Range Status   MRSA by PCR Next Gen NOT DETECTED NOT DETECTED Final    Comment: (NOTE) The GeneXpert MRSA Assay (FDA approved for NASAL specimens only), is one component of a comprehensive MRSA colonization surveillance program. It is not intended to diagnose MRSA infection nor to guide or monitor treatment for MRSA infections. Test performance is not FDA approved in patients less than 19 years old. Performed at [facility], [address] [place], [place] [address]     Coagulation Studies: No results for input(s): "LABPROT", "INR" in the last 72 hours.  Urinalysis: No results for input(s): "COLORURINE", "LABSPEC", "PHURINE", "GLUCOSEU", "HGBUR", "BILIRUBINUR", "KETONESUR", "PROTEINUR", "UROBILINOGEN", "NITRITE", "LEUKOCYTESUR" in the last 72 hours.  Invalid input(s): "APPERANCEUR"    Imaging: No results found.   Medications:    anticoagulant sodium citrate      (feeding supplement) PROSource Plus  30 mL Oral TID BM   amLODipine  10 mg Oral q morning   calcium acetate  667 mg Oral TID WC   carbamazepine  1,000 mg Oral q AM   Chlorhexidine Gluconate Cloth  6 each Topical Daily   cloNIDine  0.2 mg  Transdermal Weekly   doxazosin  4 mg Oral QHS   epoetin (EPOGEN/PROCRIT) injection  4,000 Units Intravenous Q M,W,F-HD   feeding supplement (NEPRO CARB STEADY)  237 mL Oral TID BM   ferrous sulfate  325 mg Oral Q breakfast   fluPHENAZine  2.5 mg Oral q AM   heparin  5,000 Units Subcutaneous Q8H   hydrALAZINE  100 mg Oral TID   irbesartan  300 mg Oral Daily   isosorbide mononitrate  60 mg Oral BID   magic mouthwash  10 mL Oral QID   minoxidil  10 mg Oral Daily   mometasone-formoterol  2 puff Inhalation BID   multivitamin  1 tablet Oral QHS   pantoprazole  40 mg Oral Daily   polyethylene glycol  17 g Oral BID   trihexyphenidyl  2 mg Oral BID WC   albuterol, alteplase, anticoagulant sodium citrate, diphenhydrAMINE, haloperidol lactate, heparin, hydrALAZINE, lidocaine (PF), lidocaine-prilocaine, ondansetron **OR** ondansetron (ZOFRAN) IV, mouth rinse, pentafluoroprop-tetrafluoroeth, polyethylene glycol, senna-docusate  Assessment/ Plan:  Mr. Conway Fedora is a 61 y.o.  male with past medical history including hypertension, anemia, schizophrenia and end-stage renal disease on hemodialysis.  Patient has presented to  the ED with altered mental status.  Patient has been admitted for Altered mental status [R41.82] Hypertensive urgency [I16.0] Severe hypertension [I10] Altered mental status, unspecified altered mental status type [R41.82]  [facility]/MWF/Lt AVF  End-stage renal disease on hemodialysis.  Will maintain outpatient schedule if possible.    Scheduled to receive dialysis later today.  UF goal 1 L as tolerated.  Next treatment scheduled for Wednesday.  Renal navigator aware of patient and monitoring outpatient rehab placement.     2. Anemia of chronic kidney disease Lab Results  Component Value Date   HGB 8.2 (L) 12/29/2021    Mircera prescribed outpatient.   Continue low-dose Epogen with hemodialysis.  3. Secondary Hyperparathyroidism:  Lab Results  Component  Value Date   CALCIUM 9.6 12/29/2021   PHOS 2.1 (L) 12/29/2021   We will monitor his phosphorus level since decrease in binders.  4.  Hypertension with chronic kidney disease.  Home regimen includes amlodipine, clonidine, hydralazine, isosorbide, and losartan.    Currently on amlodipine 10 mg daily, clonidine patch 0.2 mg per 24 hours, Cardura 4 mg daily at bedtime, hydralazine 100 mg 3 times per day, Avapro 300 mg daily, isosorbide 60 mg twice a day, minoxidil 10 mg daily. Blood pressure 155/69     LOS: 14   7/31/202311:31 AM

## 2021-12-29 NOTE — Progress Notes (Signed)
Post HD RN assessment 

## 2021-12-29 NOTE — Progress Notes (Signed)
Pt given Benadryl for agitation and restlessness w/ HD. VSS

## 2021-12-29 NOTE — Progress Notes (Signed)
Progress Note   Patient: Todd Brown LFY:[id number] DOB: [birth year] DOA: 12/15/2021     14 DOS: the patient was seen and examined on 12/29/2021   Brief hospital course: 61 year old male with past medical history of hypertension and end-stage renal disease on hemodialysis presented to the emergency room from his group home/skilled nursing facility on 7/17 with confusion.  (Patient's baseline is that he is unable to care for himself and has a legal guardian.)  Patient last received dialysis on Friday, 7/14.  From being in the emergency room on 7/17, unable to get dialysis yesterday.  Patient found to have markedly elevated blood pressure with a systolic in the 852D.  Patient with questionable cardiac ischemia as well.  CT scan of head and neurological exam unremarkable.  Patient was admitted to the hospitalist service and started on his oral antihypertensives, nicardipine drip and given IV labetalol and as needed IV hydralazine.  Nephrology consulted and plans to take patient for dialysis tomorrow.  Attempts to wean off Cardene have led to increasing high blood pressure, compounded by patient at first refusing to take oral medications.  Had become more physically agitated, wanting to be left alone.  Seen by psychiatry and started on scheduled and as needed Haldol.  Has become more compliant and better mentating and taking medications and blood pressure improving.  Also developed some periorbital swelling bilaterally felt to be secondary to subcutaneous emphysema and started on steroids, and periorbital swelling has since resolved.  Patient was supposed to be discharged back to his assisted living facility on 7/21 after dialysis.  His baseline is that he is able to sit in a chair for dialysis although some assistance.  And trying to get him out of bed and placed in a wheelchair, took 3 people and still patient unable to sit and stay in chair.  Discharge canceled and plan is for patient to have PT/OT  evaluation and possible skilled nursing prior to returning to assisted living.  7/24: PT and OT are recommending rehab.  TOC is working on it. Found to have some oral thrush.  Started on Magic mouthwash and prednisone was discontinued. Blood pressure remained elevated, neurology started on clonidine patch along with current multiple antihypertensives.  Had his dialysis today.  7/25: Patient was transferred to stepdown and started on nicardipine infusion due to persistently elevated blood pressure last night.  Cardene infusion was stopped at 9:30 AM when blood pressure improved to 782 systolic, have to restart as it started rising significantly despite getting all the scheduled antihypertensives. Remained quite somnolent.  Apparently was getting scheduled Benadryl which was made as needed.  Scheduled daily Haldol was also made as needed to see if that will affect his excessive sleep.  7/26: Blood pressure dropped during dialysis and nicardipine drip was discontinued.  Later started trending up.  He was also started on minoxidil at 5 mg daily and dose will be titrated. Little more alert and less somnolence after stopping scheduled Benadryl and Haldol yesterday.  7/27: Patient more alert and interactive.  Blood pressure remained elevated with multiple failed attempts to wean off nicardipine infusion.  Minoxidil dose was increased to 10 mg today.  7/28: Patient remained stable with improvement in blood pressure.  Weaned off from nicardipine infusion.  Going for dialysis today.  7/29: BP seems improving,quite alert and interactive, at baseline. Waiting for SNF.   7/30: Blood pressure still elevated, although improved than before.  Requiring as needed medications.  Otherwise seems stable and at baseline.  7/31: Blood pressure seems improving, not using much as needed.  No other complaints.  Still awaiting placement.  Going for HD today   Assessment and Plan: * Acute metabolic  encephalopathy Appears to be at baseline now   Suspecting related to elevated blood pressure. Increased somnolence seems improved after stopping scheduled Benadryl and Haldol.   -Continue to monitor  Hypertensive emergency Improving. Patient on multiple antihypertensives with very poor response. Transferred out of stepdown and blood pressure currently within acceptable range on current regimen. -Continue home antihypertensives -Continue  minoxidil at 10 mg daily .  ESRD on dialysis [facility]) Nephrology consulted, full dialysis session following admission and again today to keep him on his Monday Wednesday Friday schedule. -Continue scheduled dialysis  Mental health disorder Unspecified.  Patient is unable to care for himself.  It is unclear if he is ever had a formal diagnosis.  He is on Tegretol and Artane which he should continue.  As needed Haldol for acute behavior agitation as above.  Essential hypertension Resumed on his home p.o. amlodipine 10 mg daily, clonidine 0.3 mg p.o. 3 times daily, hydralazine 100 mg p.o. 3 times daily, isosorbide mononitrate 60 mg p.o. twice daily, losartan 100 mg daily.  Pressures at time improved, but then blood pressure starts to spike again.  As above, changing ARB and adding alpha-blocker. Able to wean him off from nicardipine and blood pressure remained within susceptible range. -Continue with current regimen  Oral thrush Patient was found to have oral thrush today.  He was on high-dose steroid, which were discontinued. -Magic mouthwash.  Subcutaneous emphysema (HCC)-resolved as of 12/18/2021 On steroids.  Appreciate pulmonary assistance.  Subcutaneous emphysema looks to be resolved.  Changed from Solu-Medrol to p.o. prednisone quick taper.  Physical deconditioning PT OT are recommending SNF. TOC to work on placement  Unspecified protein-calorie malnutrition ([place]) Estimated body mass index is 21.08 kg/m as calculated from the following:    Height as of this encounter: 6' (1.829 m).   Weight as of this encounter: 70.5 kg.   Patient was found to have moderate degree malnourishment. -Dietitian consult     Subjective: Patient was eating breakfast when seen today.  No complaints.  Physical Exam: Vitals:   12/29/21 1320 12/29/21 1330 12/29/21 1400 12/29/21 1430  BP: 122/69 135/68 134/62 140/69  Pulse: 79 81 66 65  Resp: 19 16 (!) 23 20  Temp:      TempSrc:      SpO2: 97% 96% 97% 97%  Weight:      Height:       General.  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 Pulmonary.  Lungs clear bilaterally, normal respiratory effort. CV.  Regular rate and rhythm, no JVD, rub or murmur. Abdomen.  Soft, nontender, nondistended, BS positive. CNS.  Alert and oriented .  No focal neurologic deficit. Extremities.  No edema, no cyanosis, pulses intact and symmetrical. Psychiatry.  Judgment and insight appears impaired  Data Reviewed: Prior data reviewed.  Family Communication: Patient has a legal guardian  Disposition: Status is: Inpatient Remains inpatient appropriate because: Severity of illness, waiting for placement   Planned Discharge Destination: Skilled nursing facility  DVT prophylaxis.  Subcu heparin Time spent: 40 minutes  This record has been created using Systems analyst. Errors have been sought and corrected,but may not always be located. Such creation errors do not reflect on the standard of care.  Author: Lorella Nimrod, MD 12/29/2021 3:22 PM  For on call review [email or web address].

## 2021-12-29 NOTE — Progress Notes (Signed)
Physical Therapy Treatment Patient Details Name: Todd Brown MRN: [id number] DOB: 03/13/1961 Today's Date: 12/29/2021   History of Present Illness 61 year old male with past medical history of hypertension and end-stage renal disease on hemodialysis presented to the emergency room from his group home/skilled nursing facility on 7/17 with confusion.  (Patient's baseline is that he is unable to care for himself and has a legal guardian.)  Patient last received dialysis on Friday, 7/14.  From being in the emergency room on 7/17, unable to get dialysis yesterday.  Patient found to have markedly elevated blood pressure with a systolic in the 323F.  Patient with questionable cardiac ischemia as well   PT Comments    The patient continues to require physical assistance with functional mobility efforts. Poor sitting tolerance and assistance required intermittently to maintain midline sitting balance. Activity tolerance limited by fatigue with minimal activity. Delay with processing and sequencing of activity, requiring maximal cues throughout mobility efforts. Recommend to continue PT to maximize independence and decrease caregiver burden. SNF is recommended at discharge.    Recommendations for follow up therapy are one component of a multi-disciplinary discharge planning process, led by the attending physician.  Recommendations may be updated based on patient status, additional functional criteria and insurance authorization.  Follow Up Recommendations  Skilled nursing-short term rehab (<3 hours/day) Can patient physically be transported by private vehicle: No   Assistance Recommended at Discharge Frequent or constant Supervision/Assistance  Patient can return home with the following Two people to help with walking and/or transfers;Two people to help with bathing/dressing/bathroom;Direct supervision/assist for medications management;Direct supervision/assist for financial management;Assistance with  cooking/housework;Assist for transportation;Help with stairs or ramp for entrance   Equipment Recommendations   (to be determined at next level of care)    Recommendations for Other Services       Precautions / Restrictions Precautions Precautions: Fall Restrictions Weight Bearing Restrictions: No     Mobility  Bed Mobility Overal bed mobility: Needs Assistance Bed Mobility: Supine to Sit, Sit to Supine     Supine to sit: Max assist, HOB elevated Sit to supine: Max assist   General bed mobility comments: verbal cues for sequencing and technique. assistance for LE and trunk support. total assistance was required for repositioning in the bed after sitting on edge of bed.    Transfers                   General transfer comment: not attempted due to poor sitting balance and difficulty following commands    Ambulation/Gait                   Stairs             Wheelchair Mobility    Modified Rankin (Stroke Patients Only)       Balance Overall balance assessment: Needs assistance Sitting-balance support: Feet supported Sitting balance-[name]: Poor Sitting balance - Comments: cues for midline and faciliation provided Postural control: Right lateral lean                                  Cognition Arousal/Alertness: Awake/alert Behavior During Therapy: Flat affect Overall Cognitive Status: No family/caregiver present to determine baseline cognitive functioning                                 General Comments: patient is able to  follow single step commands intermittently. increased time and effort required with all mobility tasks        Exercises      General Comments        Pertinent Vitals/Pain Pain Assessment Pain Assessment: No/denies pain    Home Living                          Prior Function            PT Goals (current goals can now be found in the care plan section) Acute  Rehab PT Goals Patient Stated Goal: unable to state PT Goal Formulation: Patient unable to participate in goal setting Time For Goal Achievement: 01/08/22 Potential to Achieve Goals: Fair Progress towards PT goals: Progressing toward goals    Frequency    Min 2X/week      PT Plan Current plan remains appropriate    Co-evaluation              AM-PAC PT "6 Clicks" Mobility   Outcome Measure  Help needed turning from your back to your side while in a flat bed without using bedrails?: A Lot Help needed moving from lying on your back to sitting on the side of a flat bed without using bedrails?: A Lot Help needed moving to and from a bed to a chair (including a wheelchair)?: Total Help needed standing up from a chair using your arms (e.g., wheelchair or bedside chair)?: Total Help needed to walk in hospital room?: Total Help needed climbing 3-5 steps with a railing? : Total 6 Click Score: 8    End of Session   Activity Tolerance: Patient limited by fatigue Patient left: in bed;with call bell/phone within reach;with bed alarm set Nurse Communication: Mobility status PT Visit Diagnosis: Other abnormalities of gait and mobility (R26.89);Muscle weakness (generalized) (M62.81);Unsteadiness on feet (R26.81)     Time: 1937-9024 PT Time Calculation (min) (ACUTE ONLY): 12 min  Charges:  $Therapeutic Activity: 8-22 mins                    [name], PT, MPT    [name] 12/29/2021, 11:51 AM

## 2021-12-29 NOTE — Assessment & Plan Note (Signed)
Nephrology consulted, full dialysis session following admission and again today to keep him on his Monday Wednesday Friday schedule. -Continue scheduled dialysis

## 2021-12-29 NOTE — Assessment & Plan Note (Signed)
Appears to be at baseline now   Suspecting related to elevated blood pressure. Increased somnolence seems improved after stopping scheduled Benadryl and Haldol.   -Continue to monitor

## 2021-12-29 NOTE — Progress Notes (Signed)
Pt completed 3.5 hr HD tx, 1L fluid removed and 84 L BVP. Report to primary RN. Pt no c/o, stable.

## 2021-12-29 NOTE — Assessment & Plan Note (Signed)
Improving. Patient on multiple antihypertensives with very poor response. Transferred out of stepdown and blood pressure currently within acceptable range on current regimen. -Continue home antihypertensives -Continue  minoxidil at 10 mg daily .

## 2021-12-30 DIAGNOSIS — G9341 Metabolic encephalopathy: Secondary | ICD-10-CM | POA: Diagnosis not present

## 2021-12-30 LAB — BASIC METABOLIC PANEL
Anion gap: 8 (ref 5–15)
BUN: 33 mg/dL — ABNORMAL HIGH (ref 8–23)
CO2: 27 mmol/L (ref 22–32)
Calcium: 9 mg/dL (ref 8.9–10.3)
Chloride: 98 mmol/L (ref 98–111)
Creatinine, Ser: 4.39 mg/dL — ABNORMAL HIGH (ref 0.61–1.24)
GFR, Estimated: 15 mL/min — ABNORMAL LOW (ref >60)
Glucose, Bld: 78 mg/dL (ref 70–99)
Potassium: 4.8 mmol/L (ref 3.5–5.1)
Sodium: 133 mmol/L — ABNORMAL LOW (ref 135–145)

## 2021-12-30 MED ORDER — EPOETIN ALFA 10000 UNIT/ML IJ SOLN
10000.0000 [IU] | INTRAMUSCULAR | Status: DC
  Administered 2022-01-02 – 2022-01-26 (×7): 10000 [IU] via INTRAVENOUS
  Filled 2021-12-30 (×16): qty 1

## 2021-12-30 NOTE — Progress Notes (Signed)
[facility]  ROUNDING NOTE   Subjective:   Todd Brown is a 61 year old male with past medical history including hypertension, anemia, schizophrenia and end-stage renal disease on hemodialysis.  Patient has presented to the ED with altered mental status.  Patient has been admitted for Altered mental status [R41.82] Hypertensive urgency [I16.0] Severe hypertension [I10] Altered mental status, unspecified altered mental status type [R41.82]  Patient is known to our practice and receives outpatient dialysis treatments at [facility] on a MWF schedule.  Update Patient seen sitting up in bed, resting quietly Denies pain and discomfort Tolerating meals with assistance Dialysis yesterday, tolerated well No lower extremity edema  Objective:  Vital signs in last 24 hours:  Temp:  [97.6 F (36.4 C)-99 F (37.2 C)] 98.3 F (36.8 C) (08/01 0820) Pulse Rate:  [64-81] 73 (08/01 0820) Resp:  [10-23] 10 (08/01 0820) BP: (118-158)/(59-143) 155/70 (08/01 0820) SpO2:  [96 %-100 %] 100 % (08/01 0820) Weight:  [78.3 kg] 78.3 kg (07/31 1230)  Weight change:  Filed Weights   12/24/21 0815 12/24/21 1251 12/29/21 1230  Weight: 72.5 kg 71.9 kg 78.3 kg    Intake/Output: I/O last 3 completed shifts: In: 195 [P.O.:195] Out: 1000 [Other:1000]   Intake/Output this shift:  Total I/O In: 360 [P.O.:360] Out: -   Physical Exam: General: NAD  Head: Normocephalic, atraumatic. Moist oral mucosal membranes  Eyes: Anicteric  Lungs:  Clear to auscultation, normal effort, room air  Heart: Regular rate and rhythm, + gallop  Abdomen:  Soft, nontender, non distended  Extremities:  No peripheral edema.  Neurologic: Alert, answer simple questions  Skin: No lesions  Access: Lt AVF    Basic Metabolic Panel: Recent Labs  Lab 12/24/21 0700 12/26/21 1310 12/29/21 0846 12/30/21 0827  NA 133* 135 133* 133*  K 4.1 4.5 5.8* 4.8  CL 96* 95* 96* 98  CO2 27 31 29 27   GLUCOSE 90 90  73 78  BUN 38* 43* 48* 33*  CREATININE 5.60* 5.12* 5.81* 4.39*  CALCIUM 9.2 9.5 9.6 9.0  PHOS 2.5 1.8* 2.1*  --      Liver Function Tests: Recent Labs  Lab 12/24/21 0700 12/26/21 1310 12/29/21 0846  ALBUMIN 3.3* 3.2* 3.0*    No results for input(s): "LIPASE", "AMYLASE" in the last 168 hours. No results for input(s): "AMMONIA" in the last 168 hours.  CBC: Recent Labs  Lab 12/24/21 0700 12/26/21 1310 12/29/21 0846  WBC 4.1 4.6 3.6*  NEUTROABS 2.9  --   --   HGB 10.1* 9.2* 8.2*  HCT 29.4* 26.6* 24.2*  MCV 95.1 96.0 97.2  PLT 147* 154 178     Cardiac Enzymes: No results for input(s): "CKTOTAL", "CKMB", "CKMBINDEX", "TROPONINI" in the last 168 hours.  BNP: Invalid input(s): "POCBNP"  CBG: No results for input(s): "GLUCAP" in the last 168 hours.   Microbiology: Results for orders placed or performed during the hospital encounter of 12/15/21  SARS Coronavirus 2 by RT PCR (hospital order, performed in [facility] hospital lab) *cepheid single result test* Anterior Nasal Swab     Status: None   Collection Time: 12/15/21  5:50 PM   Specimen: Anterior Nasal Swab  Result Value Ref Range Status   SARS Coronavirus 2 by RT PCR NEGATIVE NEGATIVE Final    Comment: (NOTE) SARS-CoV-2 target nucleic acids are NOT DETECTED.  The SARS-CoV-2 RNA is generally detectable in upper and lower respiratory specimens during the acute phase of infection. The lowest concentration of SARS-CoV-2 viral copies this  assay can detect is 250 copies / mL. A negative result does not preclude SARS-CoV-2 infection and should not be used as the sole basis for treatment or other patient management decisions.  A negative result may occur with improper specimen collection / handling, submission of specimen other than nasopharyngeal swab, presence of viral mutation(s) within the areas targeted by this assay, and inadequate number of viral copies (<250 copies / mL). A negative result must be combined  with clinical observations, patient history, and epidemiological information.  Fact Sheet for Patients:   https://www.patel.info/  Fact Sheet for Healthcare Providers: https://hall.com/  This test is not yet approved or  cleared by the [place] FDA and has been authorized for detection and/or diagnosis of SARS-CoV-2 by FDA under an Emergency Use Authorization (EUA).  This EUA will remain in effect (meaning this test can be used) for the duration of the COVID-19 declaration under Section 564(b)(1) of the Act, 21 U.S.C. section 360bbb-3(b)(1), unless the authorization is terminated or revoked sooner.  Performed at [facility], [address] [place], [place] [address]   MRSA Next Gen by PCR, Nasal     Status: None   Collection Time: 12/15/21  9:03 PM   Specimen: Nasal Mucosa; Nasal Swab  Result Value Ref Range Status   MRSA by PCR Next Gen NOT DETECTED NOT DETECTED Final    Comment: (NOTE) The GeneXpert MRSA Assay (FDA approved for NASAL specimens only), is one component of a comprehensive MRSA colonization surveillance program. It is not intended to diagnose MRSA infection nor to guide or monitor treatment for MRSA infections. Test performance is not FDA approved in patients less than 77 years old. Performed at [facility], [address] [place], [place] [address]     Coagulation Studies: No results for input(s): "LABPROT", "INR" in the last 72 hours.  Urinalysis: No results for input(s): "COLORURINE", "LABSPEC", "PHURINE", "GLUCOSEU", "HGBUR", "BILIRUBINUR", "KETONESUR", "PROTEINUR", "UROBILINOGEN", "NITRITE", "LEUKOCYTESUR" in the last 72 hours.  Invalid input(s): "APPERANCEUR"    Imaging: No results found.   Medications:    anticoagulant sodium citrate      (feeding supplement) PROSource Plus  30 mL Oral TID BM   amLODipine  10 mg Oral q morning   calcium acetate  667 mg Oral TID WC    carbamazepine  1,000 mg Oral q AM   Chlorhexidine Gluconate Cloth  6 each Topical Daily   cloNIDine  0.2 mg Transdermal Weekly   doxazosin  4 mg Oral QHS   epoetin (EPOGEN/PROCRIT) injection  4,000 Units Intravenous Q M,W,F-HD   ferrous sulfate  325 mg Oral Q breakfast   fluPHENAZine  2.5 mg Oral q AM   heparin  5,000 Units Subcutaneous Q8H   hydrALAZINE  100 mg Oral TID   irbesartan  300 mg Oral Daily   isosorbide mononitrate  60 mg Oral BID   magic mouthwash  10 mL Oral QID   minoxidil  10 mg Oral Daily   mometasone-formoterol  2 puff Inhalation BID   multivitamin  1 tablet Oral QHS   pantoprazole  40 mg Oral Daily   polyethylene glycol  17 g Oral BID   trihexyphenidyl  2 mg Oral BID WC   albuterol, alteplase, anticoagulant sodium citrate, diphenhydrAMINE, haloperidol lactate, heparin, hydrALAZINE, lidocaine (PF), lidocaine-prilocaine, ondansetron **OR** ondansetron (ZOFRAN) IV, mouth rinse, pentafluoroprop-tetrafluoroeth, polyethylene glycol, senna-docusate  Assessment/ Plan:  [name] is a 61 y.o.  male with past medical history including hypertension, anemia, schizophrenia and end-stage  renal disease on hemodialysis.  Patient has presented to the ED with altered mental status.  Patient has been admitted for Altered mental status [R41.82] Hypertensive urgency [I16.0] Severe hypertension [I10] Altered mental status, unspecified altered mental status type [R41.82]  [facility]/MWF/Lt AVF  End-stage renal disease on hemodialysis.  Will maintain outpatient schedule if possible.    Patient received dialysis yesterday, UF 1 L achieved.  Next treatment scheduled for Wednesday.  Monitoring discharge plan to include rehab to determine outpatient needs.     2. Anemia of chronic kidney disease Lab Results  Component Value Date   HGB 8.2 (L) 12/29/2021    Mircera prescribed outpatient.  Steady decline in hemoglobin, will increase EPO with dialysis   3. Secondary  Hyperparathyroidism:  Lab Results  Component Value Date   CALCIUM 9.0 12/30/2021   PHOS 2.1 (L) 12/29/2021   Calcium acceptable and phosphorus slowly improving.  Continue to monitor.  4.  Hypertension with chronic kidney disease.  Home regimen includes amlodipine, clonidine, hydralazine, isosorbide, and losartan.    Currently on amlodipine 10 mg daily, clonidine patch 0.2 mg per 24 hours, Cardura 4 mg daily at bedtime, hydralazine 100 mg 3 times per day, Avapro 300 mg daily, isosorbide 60 mg twice a day, minoxidil 10 mg daily.  Blood pressure appears stable on current medications.     LOS: [place] 8/1/202311:46 AM

## 2021-12-30 NOTE — Progress Notes (Signed)
Progress Note   Patient: Todd Brown NLG:[id number] DOB: 1960/06/27 DOA: 12/15/2021     15 DOS: the patient was seen and examined on 12/30/2021   Brief hospital course: 61 year old male with past medical history of hypertension and end-stage renal disease on hemodialysis presented to the emergency room from his group home/skilled nursing facility on 7/17 with confusion.  (Patient's baseline is that he is unable to care for himself and has a legal guardian.)  Patient last received dialysis on Friday, 7/14.  From being in the emergency room on 7/17, unable to get dialysis yesterday.  Patient found to have markedly elevated blood pressure with a systolic in the 081K.  Patient with questionable cardiac ischemia as well.  CT scan of head and neurological exam unremarkable.  Patient was admitted to the hospitalist service and started on his oral antihypertensives, nicardipine drip and given IV labetalol and as needed IV hydralazine.  Nephrology consulted and plans to take patient for dialysis tomorrow.  Attempts to wean off Cardene have led to increasing high blood pressure, compounded by patient at first refusing to take oral medications.  Had become more physically agitated, wanting to be left alone.  Seen by psychiatry and started on scheduled and as needed Haldol.  Has become more compliant and better mentating and taking medications and blood pressure improving.  Also developed some periorbital swelling bilaterally felt to be secondary to subcutaneous emphysema and started on steroids, and periorbital swelling has since resolved.  Patient was supposed to be discharged back to his assisted living facility on 7/21 after dialysis.  His baseline is that he is able to sit in a chair for dialysis although some assistance.  And trying to get him out of bed and placed in a wheelchair, took 3 people and still patient unable to sit and stay in chair.  Discharge canceled and plan is for patient to have PT/OT evaluation  and possible skilled nursing prior to returning to assisted living.  7/24: PT and OT are recommending rehab.  TOC is working on it. Found to have some oral thrush.  Started on Magic mouthwash and prednisone was discontinued. Blood pressure remained elevated, neurology started on clonidine patch along with current multiple antihypertensives.  Had his dialysis today.  7/25: Patient was transferred to stepdown and started on nicardipine infusion due to persistently elevated blood pressure last night.  Cardene infusion was stopped at 9:30 AM when blood pressure improved to 481 systolic, have to restart as it started rising significantly despite getting all the scheduled antihypertensives. Remained quite somnolent.  Apparently was getting scheduled Benadryl which was made as needed.  Scheduled daily Haldol was also made as needed to see if that will affect his excessive sleep.  7/26: Blood pressure dropped during dialysis and nicardipine drip was discontinued.  Later started trending up.  He was also started on minoxidil at 5 mg daily and dose will be titrated. Little more alert and less somnolence after stopping scheduled Benadryl and Haldol yesterday.  7/27: Patient more alert and interactive.  Blood pressure remained elevated with multiple failed attempts to wean off nicardipine infusion.  Minoxidil dose was increased to 10 mg today.  7/28: Patient remained stable with improvement in blood pressure.  Weaned off from nicardipine infusion.  Going for dialysis today.  7/29: BP seems improving,quite alert and interactive, at baseline. Waiting for SNF.   7/30: Blood pressure still elevated, although improved than before.  Requiring as needed medications.  Otherwise seems stable and at baseline.  7/31: Blood pressure seems improving, not using much as needed.  No other complaints.  Still awaiting placement.  Going for HD today.  8/1; blood pressure seems quite improved today.  Had his HD yesterday with  removal of 1 L of fluid.  Clinically seems improving and more interactive.  Waiting for placement.   Assessment and Plan: * Acute metabolic encephalopathy Appears to be at baseline now   Suspecting related to elevated blood pressure. Increased somnolence seems improved after stopping scheduled Benadryl and Haldol.   -Continue to monitor  Hypertensive emergency Resolved. -Rest detail under hypertension.  ESRD on dialysis [facility]) Nephrology consulted, full dialysis session following admission and again today to keep him on his Monday Wednesday Friday schedule. -Continue scheduled dialysis  Mental health disorder Unspecified.  Patient is unable to care for himself.  It is unclear if he is ever had a formal diagnosis.  He is on Tegretol and Artane which he should continue.  As needed Haldol for acute behavior agitation as above.  Essential hypertension Resumed on his home p.o. amlodipine 10 mg daily, clonidine 0.3 mg p.o. 3 times daily, hydralazine 100 mg p.o. 3 times daily, isosorbide mononitrate 60 mg p.o. twice daily, losartan 100 mg daily.  Pressures at time improved, but then blood pressure starts to spike again.  As above, changing ARB and adding alpha-blocker. -Nephrology added clonidine patches with p.o. clonidine. -Minoxidil was added and titrated up to 10 mg daily.  Oral thrush Improved. -Magic mouthwash.  Subcutaneous emphysema (HCC)-resolved as of 12/18/2021 On steroids.  Appreciate pulmonary assistance.  Subcutaneous emphysema looks to be resolved.  Changed from Solu-Medrol to p.o. prednisone quick taper.  Physical deconditioning PT OT are recommending SNF. TOC to work on placement  Unspecified protein-calorie malnutrition ([place]) Estimated body mass index is 21.08 kg/m as calculated from the following:   Height as of this encounter: 6' (1.829 m).   Weight as of this encounter: 70.5 kg.   Patient was found to have moderate degree malnourishment. -Dietitian consult      Subjective: Patient was sitting in chair comfortably when seen today.  No new complaints.  Ate his breakfast per nursing staff.  Physical Exam: Vitals:   12/29/21 1821 12/29/21 1937 12/30/21 0416 12/30/21 0820  BP: (!) 125/59 118/68 134/75 (!) 155/70  Pulse: 74 66 75 73  Resp:  18 18 10   Temp:  97.8 F (36.6 C) 99 F (37.2 C) 98.3 F (36.8 C)  TempSrc:    Oral  SpO2:  99% 98% 100%  Weight:      Height:       General.  Cognitively impaired gentleman, in no acute distress. Pulmonary.  Lungs clear bilaterally, normal respiratory effort. CV.  Regular rate and rhythm, no JVD, rub or murmur. Abdomen.  Soft, nontender, nondistended, BS positive. CNS.  Alert and oriented .  No focal neurologic deficit. Extremities.  No edema, no cyanosis, pulses intact and symmetrical. Psychiatry.  Judgment and insight appears impaired  Data Reviewed: Prior data reviewed.  Family Communication: Patient has a legal guardian  Disposition: Status is: Inpatient Remains inpatient appropriate because: Medically stable now, awaiting disposition   Planned Discharge Destination: Skilled nursing facility  DVT prophylaxis.  Subcu heparin Time spent: 42 minutes  This record has been created using Systems analyst. Errors have been sought and corrected,but may not always be located. Such creation errors do not reflect on the standard of care.  Author: Lorella Nimrod, MD 12/30/2021 1:02 PM  For on call  review [email or web address].

## 2021-12-30 NOTE — Progress Notes (Signed)
Nutrition Follow-up  DOCUMENTATION CODES:   Non-severe (moderate) malnutrition in context of chronic illness  INTERVENTION:   -Continue 30 ml Prosource Plus TID, each supplement provides 100 kcals and 15 grams protein -D/c Nepro -Continue renal MVI daily -Double protein portions with meals -Feeding assistance with meals  NUTRITION DIAGNOSIS:   Moderate Malnutrition related to chronic illness (ESRD on HD) as evidenced by mild fat depletion, mild muscle depletion, moderate muscle depletion.  Ongoing  GOAL:   Patient will meet greater than or equal to 90% of their needs  Progressing   MONITOR:   PO intake, Supplement acceptance, Labs, Weight trends, Skin, I & O's  REASON FOR ASSESSMENT:   Malnutrition Screening Tool    ASSESSMENT:   61 y/o male with h/o HTN, ESRD on HD, schizophrenia and lives in a group home who is admitted with thrush and AMS.  7/26- s/p BSE- dysphagia 3 diet with thin liquids  Reviewed I/O's: -805 ml x 24 hours and +1.3 L since 12/16/21  Pt sitting up in chair, working with nurse and nurse tech at time of visit. Pt is much more alert in comparison to when this RD last evaluated pt and was sitting up in chair conversing and smiling with staff. He requires feeding assistance with meals.   Pt with oral thrush, being treated with magic mouthwash.   No wt loss since admission.   Per TOC notes, pt is awaiting SNF placement.    Medications reviewed and include epogen, phoslo, ferrous sulfate, and miralax.    Labs reviewed: Na: 133, CBGS: 103 (inpatient orders for glycemic control are none).    Diet Order:   Diet Order             DIET DYS 3 Room service appropriate? Yes; Fluid consistency: Thin  Diet effective now           DIET DYS 3                   EDUCATION NEEDS:   No education needs have been identified at this time  Skin:  Skin Assessment: Reviewed RN Assessment  Last BM:  12/21/21  Height:   Ht Readings from Last 1  Encounters:  12/22/21 6' (1.829 m)    Weight:   Wt Readings from Last 1 Encounters:  12/29/21 78.3 kg    Ideal Body Weight:  80.9 kg  BMI:  Body mass index is 23.41 kg/m.  Estimated Nutritional Needs:   Kcal:  2100-2300  Protein:  105-120 grams  Fluid:  1000 ml +UOP    Loistine Chance, RD, LDN, [place] Registered Dietitian II Certified Diabetes Care and Education Specialist Please refer to [facility] for RD and/or RD on-call/weekend/after hours pager

## 2021-12-30 NOTE — Assessment & Plan Note (Signed)
Resolved. -Rest detail under hypertension.

## 2021-12-30 NOTE — Assessment & Plan Note (Signed)
Resumed on his home p.o. amlodipine 10 mg daily, clonidine 0.3 mg p.o. 3 times daily, hydralazine 100 mg p.o. 3 times daily, isosorbide mononitrate 60 mg p.o. twice daily, losartan 100 mg daily.  Pressures at time improved, but then blood pressure starts to spike again.  As above, changing ARB and adding alpha-blocker. -Nephrology added clonidine patches with p.o. clonidine. -Minoxidil was added and titrated up to 10 mg daily.

## 2021-12-30 NOTE — Progress Notes (Signed)
Occupational Therapy Treatment Patient Details Name: Todd Brown MRN: [id number] DOB: December 15, 1960 Today's Date: 12/30/2021   History of present illness 61 year old male with past medical history of hypertension and end-stage renal disease on hemodialysis presented to the emergency room from his group home/skilled nursing facility on 7/17 with confusion.  (Patient's baseline is that he is unable to care for himself and has a legal guardian.)  Patient last received dialysis on Friday, 7/14.  From being in the emergency room on 7/17, unable to get dialysis yesterday.  Patient found to have markedly elevated blood pressure with a systolic in the 623J.  Patient with questionable cardiac ischemia as well   OT comments  Pt seen for PT/OT co-treatment on this date. Upon arrival to room pt awake and alert, lying in bed with HOB elevated and nursing present. Pt agreeable to tx focused on dressing and functional mobility. Pt required MAX A for donning/doffing socks bed level. MAX A x2 with multimodal cueing for initiation and sequencing for supine>sit. MOD A x2 with multimodal cueing for sequencing, initiation and safe hand placement for STS and stand/step pivot t/f from bed to chair. Pt making good progress toward goals. Pt left in chair with all needs met and PT and MD present. Pt continues to benefit from skilled OT services to maximize return to PLOF and minimize risk of future falls, injury, caregiver burden, and readmission. Will continue to follow POC. Discharge recommendation remains appropriate.     Recommendations for follow up therapy are one component of a multi-disciplinary discharge planning process, led by the attending physician.  Recommendations may be updated based on patient status, additional functional criteria and insurance authorization.    Follow Up Recommendations  Skilled nursing-short term rehab (<3 hours/day)    Assistance Recommended at Discharge Frequent or constant  Supervision/Assistance  Patient can return home with the following  A lot of help with bathing/dressing/bathroom;A lot of help with walking and/or transfers   Equipment Recommendations  Other (comment) (per next venue of care)    Recommendations for Other Services      Precautions / Restrictions Precautions Precautions: Fall Restrictions Weight Bearing Restrictions: No       Mobility Bed Mobility Overal bed mobility: Needs Assistance Bed Mobility: Supine to Sit     Supine to sit: Max assist, HOB elevated          Transfers Overall transfer level: Needs assistance Equipment used: None Transfers: Sit to/from Stand, Bed to chair/wheelchair/BSC Sit to Stand: +2 safety/equipment, Mod assist     Step pivot transfers: Mod assist, +2 physical assistance           Balance Overall balance assessment: Needs assistance Sitting-balance support: Feet supported Sitting balance-[name]: Fair     Standing balance support: Bilateral upper extremity supported Standing balance-[name]: Poor                             ADL either performed or assessed with clinical judgement   ADL Overall ADL's : Needs assistance/impaired                                       General ADL Comments: MAX A for donning/doffing socks bed level.      Cognition Arousal/Alertness: Awake/alert Behavior During Therapy: Flat affect Overall Cognitive Status: No family/caregiver present to determine baseline cognitive functioning Area of Impairment:  Following commands, Attention, Awareness, Problem solving                   Current Attention Level: Focused   Following Commands: Follows one step commands inconsistently   Awareness: Intellectual Problem Solving: Slow processing, Decreased initiation, Difficulty sequencing, Requires verbal cues, Requires tactile cues                General Comments patient seated in chair at end of session, asking  for saltine crackers.    Pertinent Vitals/ Pain       Pain Assessment Pain Assessment: No/denies pain   Frequency  Min 2X/week        Progress Toward Goals  OT Goals(current goals can now be found in the care plan section)  Progress towards OT goals: Progressing toward goals  Acute Rehab OT Goals OT Goal Formulation: With patient ADL Goals Pt Will Perform Grooming: sitting;with set-up Pt Will Transfer to Toilet: with supervision;stand pivot transfer Pt Will Perform Toileting - Clothing Manipulation and hygiene: with min assist;sit to/from stand  Plan Discharge plan remains appropriate;Frequency remains appropriate    Co-evaluation    PT/OT/SLP Co-Evaluation/Treatment: Yes Reason for Co-Treatment: Complexity of the patient's impairments (multi-system involvement) PT goals addressed during session: Mobility/safety with mobility OT goals addressed during session: ADL's and self-care      AM-PAC OT "6 Clicks" Daily Activity     Outcome Measure   Help from another person eating meals?: A Lot Help from another person taking care of personal grooming?: A Lot Help from another person toileting, which includes using toliet, bedpan, or urinal?: A Lot Help from another person bathing (including washing, rinsing, drying)?: A Lot Help from another person to put on and taking off regular upper body clothing?: A Lot Help from another person to put on and taking off regular lower body clothing?: A Lot 6 Click Score: 12    End of Session Equipment Utilized During Treatment: Gait belt  OT Visit Diagnosis: Unsteadiness on feet (R26.81);Muscle weakness (generalized) (M62.81)   Activity Tolerance Patient tolerated treatment well   Patient Left in chair;with call bell/phone within reach;Other (comment) (Left pt in chair with PT and MD present)   Nurse Communication          Time: 1008-1030 OT Time Calculation (min): 22 min  Charges: OT General Charges $OT Visit: 1  Visit OT Treatments $Self Care/Home Management : 8-22 mins  [employer], OTDS  [employer] 12/30/2021, 1:25 PM

## 2021-12-30 NOTE — Assessment & Plan Note (Signed)
Improved. -Magic mouthwash.

## 2021-12-30 NOTE — Progress Notes (Signed)
Physical Therapy Treatment Patient Details Name: Todd Brown MRN: [id number] DOB: Sep 14, 1960 Today's Date: 12/30/2021   History of Present Illness 61 year old male with past medical history of hypertension and end-stage renal disease on hemodialysis presented to the emergency room from his group home/skilled nursing facility on 7/17 with confusion.  (Patient's baseline is that he is unable to care for himself and has a legal guardian.)  Patient last received dialysis on Friday, 7/14.  From being in the emergency room on 7/17, unable to get dialysis yesterday.  Patient found to have markedly elevated blood pressure with a systolic in the 431V.  Patient with questionable cardiac ischemia as well    PT Comments    Patient was more alert and talkative than previous sessions. He continues to require cues physical assistance during all functional mobility efforts and cues for task initiation/sequencing. He required +2 person assistance for stand step transfer from bed to chair. Recommend PT follow up to maximize independence and facilitate return to prior level of function. SNF is recommended at discharge.    Recommendations for follow up therapy are one component of a multi-disciplinary discharge planning process, led by the attending physician.  Recommendations may be updated based on patient status, additional functional criteria and insurance authorization.  Follow Up Recommendations  Skilled nursing-short term rehab (<3 hours/day) Can patient physically be transported by private vehicle: No   Assistance Recommended at Discharge Frequent or constant Supervision/Assistance  Patient can return home with the following Two people to help with walking and/or transfers;Two people to help with bathing/dressing/bathroom;Direct supervision/assist for medications management;Direct supervision/assist for financial management;Assistance with cooking/housework;Assist for transportation;Help with stairs or ramp  for entrance   Equipment Recommendations   (to be determined at next level of care)    Recommendations for Other Services       Precautions / Restrictions Precautions Precautions: Fall Restrictions Weight Bearing Restrictions: No     Mobility  Bed Mobility Overal bed mobility: Needs Assistance Bed Mobility: Supine to Sit, Sit to Supine     Supine to sit: Max assist, HOB elevated     General bed mobility comments: assistance for trunk and BLE support. increased time and effort required. difficulty with initiation with delay with all functional mobility    Transfers Overall transfer level: Needs assistance Equipment used: None Transfers: Bed to chair/wheelchair/BSC     Step pivot transfers: Mod assist, +2 physical assistance       General transfer comment: maximal verbal and tactile cues for sequending and navigational cues due to vision impairment. +2 person assistance is required for safety    Ambulation/Gait         Gait velocity: not attempted due to poor standing tolerance and fatigue with minimal activity         Stairs             Wheelchair Mobility    Modified Rankin (Stroke Patients Only)       Balance Overall balance assessment: Needs assistance Sitting-balance support: Feet supported Sitting balance-[name]: Fair Sitting balance - Comments: close stand by assistance for safety   Standing balance support: Bilateral upper extremity supported Standing balance-[name]: Poor Standing balance comment: external support required                            Cognition Arousal/Alertness: Awake/alert Behavior During Therapy: Flat affect Overall Cognitive Status: No family/caregiver present to determine baseline cognitive functioning  General Comments: patient is more talkative today than previous sessions. communication is difficult to understand at times. increased time and  multi-modal cueing is required with all mobility tasks        Exercises      General Comments General comments (skin integrity, edema, etc.): patient seated in chair at end of session, asking for saltine crackers.      Pertinent Vitals/Pain Pain Assessment Pain Assessment: No/denies pain    Home Living                          Prior Function            PT Goals (current goals can now be found in the care plan section) Acute Rehab PT Goals Patient Stated Goal: unable to state PT Goal Formulation: Patient unable to participate in goal setting Time For Goal Achievement: 01/08/22 Potential to Achieve Goals: Fair Progress towards PT goals: Progressing toward goals    Frequency    Min 2X/week      PT Plan Current plan remains appropriate    Co-evaluation PT/OT/SLP Co-Evaluation/Treatment: Yes Reason for Co-Treatment: Complexity of the patient's impairments (multi-system involvement) PT goals addressed during session: Mobility/safety with mobility        AM-PAC PT "6 Clicks" Mobility   Outcome Measure  Help needed turning from your back to your side while in a flat bed without using bedrails?: A Lot Help needed moving from lying on your back to sitting on the side of a flat bed without using bedrails?: A Lot Help needed moving to and from a bed to a chair (including a wheelchair)?: Total Help needed standing up from a chair using your arms (e.g., wheelchair or bedside chair)?: Total Help needed to walk in hospital room?: Total Help needed climbing 3-5 steps with a railing? : Total 6 Click Score: 8    End of Session Equipment Utilized During Treatment: Gait belt;Oxygen Activity Tolerance: Patient tolerated treatment well Patient left: in chair;with call bell/phone within reach;with chair alarm set Nurse Communication: Mobility status PT Visit Diagnosis: Other abnormalities of gait and mobility (R26.89);Muscle weakness (generalized)  (M62.81);Unsteadiness on feet (R26.81)     Time: 1005-1036 PT Time Calculation (min) (ACUTE ONLY): 31 min  Charges:  $Therapeutic Activity: 8-22 mins                     [name], PT, MPT    [name] 12/30/2021, 11:34 AM

## 2021-12-30 NOTE — TOC Progression Note (Addendum)
Transition of Care [facility]) - Progression Note    Patient Details  Name: Todd Brown MRN: [id number] Date of Birth: 1960-09-25  [facility] CM/SW [place], RN Phone Number: 12/30/2021, 8:50 AM  Clinical Narrative:   No bed offers at this time, RNCM resent search to all pending and considering facilities, awaiting response.   Addendum 1402: no bed offers at this time, TOC will monitor   Expected Discharge Plan: [place] Barriers to Discharge: Continued Medical Work up  Expected Discharge Plan and Services Expected Discharge Plan: [place] arrangements for the past 2 months: [employer] Expected Discharge Date: 12/19/21                                     Social Determinants of Health (SDOH) Interventions    Readmission Risk Interventions     No data to display

## 2021-12-31 ENCOUNTER — Other Ambulatory Visit (INDEPENDENT_AMBULATORY_CARE_PROVIDER_SITE_OTHER): Payer: Self-pay | Admitting: Vascular Surgery

## 2021-12-31 DIAGNOSIS — B370 Candidal stomatitis: Secondary | ICD-10-CM

## 2021-12-31 DIAGNOSIS — G9341 Metabolic encephalopathy: Secondary | ICD-10-CM | POA: Diagnosis not present

## 2021-12-31 DIAGNOSIS — I10 Essential (primary) hypertension: Secondary | ICD-10-CM | POA: Diagnosis not present

## 2021-12-31 DIAGNOSIS — N186 End stage renal disease: Secondary | ICD-10-CM | POA: Diagnosis not present

## 2021-12-31 NOTE — Assessment & Plan Note (Addendum)
8/23: start nystatin suspension

## 2021-12-31 NOTE — Assessment & Plan Note (Addendum)
Resumed on his home p.o. amlodipine 10 mg daily, clonidine 0.3 mg p.o. 3 times daily, hydralazine 100 mg p.o. 3 times daily, isosorbide mononitrate 60 mg p.o. twice daily, losartan 100 mg daily.  Pressures at time improved, but then blood pressure starts to spike again.  As above, changing ARB and adding alpha-blocker. -Nephrology added clonidine patches with p.o. clonidine. -Minoxidil was then added and titrated up to 10 mg daily.  During the first week of August, patient occasionally had episodes of lower blood pressure without any blood pressure spikes, so some of his newer medications were decreased: hydralazine and minoxidil was scaled back.  With blood pressure on the rise again, have increased minoxidil back to 10 mg and hydralazine up to 10 mg 3 times daily.  Pressures softer after dialysis and started to trend back upward the day before the next dialysis session.

## 2021-12-31 NOTE — Assessment & Plan Note (Signed)
PT OT are recommending SNF.  However, this is similar to a previous hospitalization where TOC was unable to find skilled nursing and over time, patient's strength improved that he was able to be discharged back to his assisted living facility.

## 2021-12-31 NOTE — Assessment & Plan Note (Signed)
Resolved.  See below.

## 2021-12-31 NOTE — Assessment & Plan Note (Addendum)
Unspecified.  Patient is unable to care for himself.  It is unclear if he is ever had a formal diagnosis.  He is on Tegretol and Artane which he should continue.  As needed Haldol for acute behavior agitation as above.  He is for the most part much more calm and cooperative these days over times, he intermittently refused vitals being checked or taking his medications.  Patient had a little more agitation on 8/31 night.  Adjusted Haldol as needed.

## 2021-12-31 NOTE — Progress Notes (Signed)
OT Cancellation Note  Patient Details Name: Todd Brown MRN: [id number] DOB: [birth year]   Cancelled Treatment:    Reason Eval/Treat Not Completed: Patient's level of consciousness (OT treatment attempted. Pt. is lethargic with difficulty remaining alert to attend to, and engage in the treatment session. Will continue to monitor, and attempt OT treatment at a later time/date.)  Harrel Carina, MS, OTR/L 12/31/2021, 11:19 AM

## 2021-12-31 NOTE — Progress Notes (Signed)
Physical Therapy Treatment Patient Details Name: Todd Brown MRN: [id number] DOB: 1960-11-23 Today's Date: 12/31/2021   History of Present Illness 61 year old male with past medical history of hypertension and end-stage renal disease on hemodialysis presented to the emergency room from his group home/skilled nursing facility on 7/17 with confusion.  (Patient's baseline is that he is unable to care for himself and has a legal guardian.)  Patient last received dialysis on Friday, 7/14.  From being in the emergency room on 7/17, unable to get dialysis yesterday.  Patient found to have markedly elevated blood pressure with a systolic in the 449Q.  Patient with questionable cardiac ischemia as well    PT Comments    Patient is agreeable to PT. He continues to require assistance with functional mobility. Patient declined standing this session. Recommend PT follow up to maximize independence and decrease caregiver burden. SNF recommended at discharge.    Recommendations for follow up therapy are one component of a multi-disciplinary discharge planning process, led by the attending physician.  Recommendations may be updated based on patient status, additional functional criteria and insurance authorization.  Follow Up Recommendations  Skilled nursing-short term rehab (<3 hours/day) Can patient physically be transported by private vehicle: No   Assistance Recommended at Discharge Frequent or constant Supervision/Assistance  Patient can return home with the following Two people to help with walking and/or transfers;Two people to help with bathing/dressing/bathroom;Direct supervision/assist for medications management;Direct supervision/assist for financial management;Assistance with cooking/housework;Assist for transportation;Help with stairs or ramp for entrance   Equipment Recommendations   (to bed determined at next level of care)    Recommendations for Other Services       Precautions /  Restrictions Precautions Precautions: Fall Precaution Comments: vision imaired Restrictions Weight Bearing Restrictions: No     Mobility  Bed Mobility Overal bed mobility: Needs Assistance Bed Mobility: Supine to Sit, Sit to Supine     Supine to sit: Max assist Sit to supine: Max assist   General bed mobility comments: assistance for LE and trunk support. multi-modal cues required. total assistance for repositioning once in the bed    Transfers                   General transfer comment: patient declined standing. patient does not participate with attempts at scooting    Ambulation/Gait                   Stairs             Wheelchair Mobility    Modified Rankin (Stroke Patients Only)       Balance Overall balance assessment: Needs assistance Sitting-balance support: Feet supported Sitting balance-[name]: Fair Sitting balance - Comments: no loss of balance in sitting position                                    Cognition Arousal/Alertness: Awake/alert Behavior During Therapy: Flat affect Overall Cognitive Status: No family/caregiver present to determine baseline cognitive functioning                                 General Comments: patient able to follow commands with increased time, repetition, occasional tactile cues. delay with initiation and increased time required to complete all tasks        Exercises General Exercises - Lower Extremity Long Arc Quad: [name],  Strengthening, Both, 5 reps, Seated Hip Flexion/Marching: AAROM, Strengthening, Both, 5 reps, Seated Other Exercises Other Exercises: verbal and visual cues for exercise technique    General Comments        Pertinent Vitals/Pain Pain Assessment Pain Assessment: No/denies pain    Home Living                          Prior Function            PT Goals (current goals can now be found in the care plan section) Acute Rehab  PT Goals Patient Stated Goal: unable to state PT Goal Formulation: With patient Time For Goal Achievement: 01/08/22 Potential to Achieve Goals: Fair Progress towards PT goals: Progressing toward goals    Frequency    Min 2X/week      PT Plan Current plan remains appropriate    Co-evaluation              AM-PAC PT "6 Clicks" Mobility   Outcome Measure  Help needed turning from your back to your side while in a flat bed without using bedrails?: A Lot Help needed moving from lying on your back to sitting on the side of a flat bed without using bedrails?: A Lot Help needed moving to and from a bed to a chair (including a wheelchair)?: Total Help needed standing up from a chair using your arms (e.g., wheelchair or bedside chair)?: Total Help needed to walk in hospital room?: Total Help needed climbing 3-5 steps with a railing? : Total 6 Click Score: 8    End of Session   Activity Tolerance: Patient limited by fatigue Patient left: in bed;with call bell/phone within reach;with bed alarm set;with nursing/sitter in room Nurse Communication: Mobility status PT Visit Diagnosis: Other abnormalities of gait and mobility (R26.89);Muscle weakness (generalized) (M62.81);Unsteadiness on feet (R26.81)     Time: 0037-0488 PT Time Calculation (min) (ACUTE ONLY): 11 min  Charges:  $Therapeutic Activity: 8-22 mins                     [name], PT, MPT    [name] 12/31/2021, 12:23 PM

## 2021-12-31 NOTE — Assessment & Plan Note (Signed)
Nephrology consulted, full dialysis session following admission and again today to keep him on his Monday Wednesday Friday schedule. -Continue scheduled dialysis

## 2021-12-31 NOTE — Progress Notes (Signed)
[facility]  ROUNDING NOTE   Subjective:   Todd Brown is a 61 year old male with past medical history including hypertension, anemia, schizophrenia and end-stage renal disease on hemodialysis.  Patient has presented to the ED with altered mental status.  Patient has been admitted for Altered mental status [R41.82] Hypertensive urgency [I16.0] Severe hypertension [I10] Altered mental status, unspecified altered mental status type [R41.82]  Patient is known to our practice and receives outpatient dialysis treatments at [facility] on a MWF schedule.  Update Patient seen resting quietly Denies pain and discomfort Awaiting breakfast, will require assistance  Objective:  Vital signs in last 24 hours:  Temp:  [98.2 F (36.8 C)-98.6 F (37 C)] 98.3 F (36.8 C) (08/02 1227) Pulse Rate:  [62-70] 62 (08/02 1236) Resp:  [10-20] 10 (08/02 1236) BP: (98-148)/(66-76) 114/66 (08/02 1230) SpO2:  [99 %-100 %] 100 % (08/02 1236)  Weight change:  Filed Weights   12/24/21 0815 12/24/21 1251 12/29/21 1230  Weight: 72.5 kg 71.9 kg 78.3 kg    Intake/Output: I/O last 3 completed shifts: In: 600 [P.O.:600] Out: -    Intake/Output this shift:  Total I/O In: 240 [P.O.:240] Out: -   Physical Exam: General: NAD  Head: Normocephalic, atraumatic. Moist oral mucosal membranes  Eyes: Anicteric  Lungs:  Clear to auscultation, normal effort, room air  Heart: Regular rate and rhythm, + gallop  Abdomen:  Soft, nontender, non distended  Extremities:  No peripheral edema.  Neurologic: Alert, answer simple questions  Skin: No lesions  Access: Lt AVF    Basic Metabolic Panel: Recent Labs  Lab 12/26/21 1310 12/29/21 0846 12/30/21 0827  NA 135 133* 133*  K 4.5 5.8* 4.8  CL 95* 96* 98  CO2 31 29 27   GLUCOSE 90 73 78  BUN 43* 48* 33*  CREATININE 5.12* 5.81* 4.39*  CALCIUM 9.5 9.6 9.0  PHOS 1.8* 2.1*  --      Liver Function Tests: Recent Labs  Lab 12/26/21 1310  12/29/21 0846  ALBUMIN 3.2* 3.0*    No results for input(s): "LIPASE", "AMYLASE" in the last 168 hours. No results for input(s): "AMMONIA" in the last 168 hours.  CBC: Recent Labs  Lab 12/26/21 1310 12/29/21 0846  WBC 4.6 3.6*  HGB 9.2* 8.2*  HCT 26.6* 24.2*  MCV 96.0 97.2  PLT 154 178     Cardiac Enzymes: No results for input(s): "CKTOTAL", "CKMB", "CKMBINDEX", "TROPONINI" in the last 168 hours.  BNP: Invalid input(s): "POCBNP"  CBG: No results for input(s): "GLUCAP" in the last 168 hours.   Microbiology: Results for orders placed or performed during the hospital encounter of 12/15/21  SARS Coronavirus 2 by RT PCR (hospital order, performed in [facility] hospital lab) *cepheid single result test* Anterior Nasal Swab     Status: None   Collection Time: 12/15/21  5:50 PM   Specimen: Anterior Nasal Swab  Result Value Ref Range Status   SARS Coronavirus 2 by RT PCR NEGATIVE NEGATIVE Final    Comment: (NOTE) SARS-CoV-2 target nucleic acids are NOT DETECTED.  The SARS-CoV-2 RNA is generally detectable in upper and lower respiratory specimens during the acute phase of infection. The lowest concentration of SARS-CoV-2 viral copies this assay can detect is 250 copies / mL. A negative result does not preclude SARS-CoV-2 infection and should not be used as the sole basis for treatment or other patient management decisions.  A negative result may occur with improper specimen collection / handling, submission of specimen other than  nasopharyngeal swab, presence of viral mutation(s) within the areas targeted by this assay, and inadequate number of viral copies (<250 copies / mL). A negative result must be combined with clinical observations, patient history, and epidemiological information.  Fact Sheet for Patients:   https://www.patel.info/  Fact Sheet for Healthcare Providers: https://hall.com/  This test is not yet approved  or  cleared by the [place] FDA and has been authorized for detection and/or diagnosis of SARS-CoV-2 by FDA under an Emergency Use Authorization (EUA).  This EUA will remain in effect (meaning this test can be used) for the duration of the COVID-19 declaration under Section 564(b)(1) of the Act, 21 U.S.C. section 360bbb-3(b)(1), unless the authorization is terminated or revoked sooner.  Performed at [facility], [address] [place], [place] [address]   MRSA Next Gen by PCR, Nasal     Status: None   Collection Time: 12/15/21  9:03 PM   Specimen: Nasal Mucosa; Nasal Swab  Result Value Ref Range Status   MRSA by PCR Next Gen NOT DETECTED NOT DETECTED Final    Comment: (NOTE) The GeneXpert MRSA Assay (FDA approved for NASAL specimens only), is one component of a comprehensive MRSA colonization surveillance program. It is not intended to diagnose MRSA infection nor to guide or monitor treatment for MRSA infections. Test performance is not FDA approved in patients less than 62 years old. Performed at [facility], [address] [place], [place] [address]     Coagulation Studies: No results for input(s): "LABPROT", "INR" in the last 72 hours.  Urinalysis: No results for input(s): "COLORURINE", "LABSPEC", "PHURINE", "GLUCOSEU", "HGBUR", "BILIRUBINUR", "KETONESUR", "PROTEINUR", "UROBILINOGEN", "NITRITE", "LEUKOCYTESUR" in the last 72 hours.  Invalid input(s): "APPERANCEUR"    Imaging: No results found.   Medications:    anticoagulant sodium citrate      (feeding supplement) PROSource Plus  30 mL Oral TID BM   amLODipine  10 mg Oral q morning   calcium acetate  667 mg Oral TID WC   carbamazepine  1,000 mg Oral q AM   Chlorhexidine Gluconate Cloth  6 each Topical Daily   cloNIDine  0.2 mg Transdermal Weekly   doxazosin  4 mg Oral QHS   epoetin (EPOGEN/PROCRIT) injection  10,000 Units Intravenous Q M,W,F-HD   ferrous sulfate  325 mg Oral Q  breakfast   fluPHENAZine  2.5 mg Oral q AM   heparin  5,000 Units Subcutaneous Q8H   hydrALAZINE  100 mg Oral TID   irbesartan  300 mg Oral Daily   isosorbide mononitrate  60 mg Oral BID   magic mouthwash  10 mL Oral QID   minoxidil  10 mg Oral Daily   mometasone-formoterol  2 puff Inhalation BID   multivitamin  1 tablet Oral QHS   pantoprazole  40 mg Oral Daily   polyethylene glycol  17 g Oral BID   trihexyphenidyl  2 mg Oral BID WC   albuterol, alteplase, anticoagulant sodium citrate, diphenhydrAMINE, haloperidol lactate, heparin, hydrALAZINE, lidocaine (PF), lidocaine-prilocaine, ondansetron **OR** ondansetron (ZOFRAN) IV, mouth rinse, pentafluoroprop-tetrafluoroeth, polyethylene glycol, senna-docusate  Assessment/ Plan:  [name] is a 61 y.o.  male with past medical history including hypertension, anemia, schizophrenia and end-stage renal disease on hemodialysis.  Patient has presented to the ED with altered mental status.  Patient has been admitted for Altered mental status [R41.82] Hypertensive urgency [I16.0] Severe hypertension [I10] Altered mental status, unspecified altered mental status type [R41.82]  [facility]/MWF/Lt AVF  End-stage renal disease  on hemodialysis.  Will maintain outpatient schedule if possible.    Will receive dialysis later today, UF goal 1L as tolerated. Next treatment scheduled for Friday.   Monitoring discharge plan to include rehab     2. Anemia of chronic kidney disease Lab Results  Component Value Date   HGB 8.2 (L) 12/29/2021    Mircera prescribed outpatient. EPO increased to 10000 unit IV with dialysis   3. Secondary Hyperparathyroidism:  Lab Results  Component Value Date   CALCIUM 9.0 12/30/2021   PHOS 2.1 (L) 12/29/2021   Will monitor bone minerals during this admission.   4.  Hypertension with chronic kidney disease.  Home regimen includes amlodipine, clonidine, hydralazine, isosorbide, and losartan.    Currently  on amlodipine 10 mg daily, clonidine patch 0.2 mg per 24 hours, Cardura 4 mg daily at bedtime, hydralazine 100 mg 3 times per day, Avapro 300 mg daily, isosorbide 60 mg twice a day, minoxidil 10 mg daily.  Blood pressure stable     LOS: 16   8/2/202312:51 PM

## 2021-12-31 NOTE — Progress Notes (Signed)
OT Cancellation Note  Patient Details Name: Todd Brown MRN: [id number] DOB: December 26, 1960   Cancelled Treatment:    Reason Eval/Treat Not Completed: Patient at procedure or test/ unavailable (Pt.is receiving Dialysis. Will attempt OT treatment on the next available treatment date.)  Harrel Carina, MS, OTR/L 12/31/2021, 12:52 PM

## 2021-12-31 NOTE — Progress Notes (Signed)
Received patient in bed to unit.  Alert and oriented.  Informed consent signed and in  chart.   Treatment initiated: 1236 Treatment completed: 1638  Patient tolerated well.  Transported back to the room  alert, without acute distress.  Hand-off given to patient's nurse.   Access used: Fistula Access issues: none  Total UF removed: 1000cc Medication(s) given: none Post HD VS: Temp- 97.7, 109/58 (71), HR-66, RR-15, Sp02- 100 Post HD weight: 77.5kg    [facility]

## 2021-12-31 NOTE — Assessment & Plan Note (Signed)
Nutrition Status: Nutrition Problem: Moderate Malnutrition Etiology: chronic illness (ESRD on HD) Signs/Symptoms: mild fat depletion, mild muscle depletion, moderate muscle depletion Interventions: Refer to RD note for recommendations, Nepro shake, MVI, Prostat

## 2021-12-31 NOTE — Assessment & Plan Note (Signed)
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  Resolved and now back at baseline.  Had some increased somnolence which improved after stopping his scheduled Benadryl and Haldol.

## 2021-12-31 NOTE — Progress Notes (Signed)
[facility] Progress Note  Patient: Todd Brown    HQI:[id number]  DOA: 12/15/2021    Date of Service: the patient was seen and examined on 12/31/2021  Brief hospital course: 61 year old male with past medical history of hypertension and end-stage renal disease on hemodialysis presented to the emergency room from his group home/skilled nursing facility on 7/17 with confusion.  (Patient's baseline is that he is unable to care for himself and has a legal guardian.)  Patient last received dialysis on Friday, 7/14.  FPatient found to have markedly elevated blood pressure with a systolic in the 132G requiring Cardene drip and placement of patient to the ICU.  Patient with questionable cardiac ischemia as well.  CT scan of head and neurological exam unremarkable.  Patient was admitted to the hospitalist service and started on his oral antihypertensives, nicardipine drip and given IV labetalol and as needed IV hydralazine.  Nephrology consulted and with plans to resume patient's dialysis  Attempts to wean off Cardene have led to increasing high blood pressure, compounded by patient at first refusing to take oral medications.  Had become more physically agitated, wanting to be left alone.  Seen by psychiatry and started on scheduled and as needed Haldol.  Has become more compliant and better mentating and taking medications and blood pressure improving.  Also developed some periorbital swelling bilaterally felt to be secondary to subcutaneous emphysema and started on steroids, and periorbital swelling has since resolved.  Over time, blood pressure became more manageable.  Patient was supposed to be discharged back to his assisted living facility on 7/21 after dialysis.  His baseline is that he is able to sit in a chair for dialysis although some assistance.  And trying to get him out of bed and placed in a wheelchair, took 3 people and still patient unable to sit and stay in chair.  Discharge canceled and plan  is for patient to have PT/OT evaluation and possible skilled nursing prior to returning to assisted living.  PT and OT are recommending rehab.  Patient with very limited options in terms of excepting skilled nursing facilities.  Found to have some oral thrush.  Started on Magic mouthwash and prednisone was discontinued.  Patient did have to return to stepdown on 7/25 with restarting of Cardene drip which was able to be discontinued by 7/26.  At this time, he is still waiting for skilled nursing.  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  Resolved and now back at baseline.  Had some increased somnolence which improved after stopping his scheduled Benadryl and Haldol.  Hypertensive emergency-resolved as of 12/31/2021 Resolved.  See below.  ESRD on dialysis [facility]) Nephrology consulted, full dialysis session following admission and again today to keep him on his Monday Wednesday Friday schedule. -Continue scheduled dialysis  Mental health disorder Unspecified.  Patient is unable to care for himself.  It is unclear if he is ever had a formal diagnosis.  He is on Tegretol and Artane which he should continue.  As needed Haldol for acute behavior agitation as above.  Essential hypertension Resumed on his home p.o. amlodipine 10 mg daily, clonidine 0.3 mg p.o. 3 times daily, hydralazine 100 mg p.o. 3 times daily, isosorbide mononitrate 60 mg p.o. twice daily, losartan 100 mg daily.  Pressures at time improved, but then blood pressure starts to spike again.  As above, changing ARB and adding alpha-blocker. -Nephrology added clonidine patches with  p.o. clonidine. -Minoxidil was added and titrated up to 10 mg daily.  Oral thrush Improved with Magic mouthwash  Subcutaneous emphysema (HCC)-resolved as of 12/18/2021 On steroids.  Appreciate pulmonary assistance.  Subcutaneous emphysema looks to be  resolved.  Changed from Solu-Medrol to p.o. prednisone quick taper.  Physical deconditioning PT OT are recommending SNF.  However, this is similar to a previous hospitalization where TOC was unable to find skilled nursing and over time, patient's strength improved that he was able to be discharged back to his assisted living facility.  Malnutrition of moderate degree Nutrition Status: Nutrition Problem: Moderate Malnutrition Etiology: chronic illness (ESRD on HD) Signs/Symptoms: mild fat depletion, mild muscle depletion, moderate muscle depletion Interventions: Refer to RD note for recommendations, Nepro shake, MVI, Prostat          Body mass index is 23.56 kg/m.  Nutrition Problem: Moderate Malnutrition Etiology: chronic illness (ESRD on HD)     Consultants: Nephrology Critical care Psychiatry  Procedures: Hemodialysis   Antimicrobials: None  Code Status: Full code   Subjective: Patient okay, not very interactive  Objective: Blood pressures have remained stable Vitals:   12/31/21 1330 12/31/21 1400  BP: 102/61 97/61  Pulse: 63 67  Resp: 10 17  Temp:    SpO2: 99% 100%    Intake/Output Summary (Last 24 hours) at 12/31/2021 1410 Last data filed at 12/31/2021 0942 Gross per 24 hour  Intake 480 ml  Output --  Net 480 ml    Filed Weights   12/24/21 1251 12/29/21 1230 12/31/21 1236  Weight: 71.9 kg 78.3 kg 78.8 kg   Body mass index is 23.56 kg/m.  Exam:  General: Quiet, no acute distress HEENT: Normocephalic and atraumatic, mucous membranes moist Cardiovascular: Regular rate and rhythm, S1-S2, 2 out of 6 systolic ejection murmur Respiratory: Clear to auscultation bilaterally Abdomen: Soft, nontender, nondistended, hypoactive bowel sounds Musculoskeletal: No clubbing or cyanosis or edema Skin: No skin breaks, tears or lesions Psychiatry: Appropriate, no evidence of psychoses, flattened affect Neurology: Difficult to assess secondary to  compliance  Data Reviewed: No labs today  Disposition:  Status is: Inpatient Remains inpatient appropriate because: Looking for skilled nursing facility options    Anticipated discharge date: 8/7  Family Communication: No family, message left for guardian DVT Prophylaxis: heparin injection 5,000 Units Start: 12/15/21 1645 Place TED hose Start: 12/15/21 1629    Author: Annita Brod ,MD 12/31/2021 2:10 PM  To reach On-call, see care teams to locate the attending and reach out via [email or web address]. Between 7PM-7AM, please contact night-coverage If you still have difficulty reaching the attending provider, please page the [facility] (Director on Call) for [facility] on amion for assistance.

## 2022-01-01 ENCOUNTER — Ambulatory Visit (INDEPENDENT_AMBULATORY_CARE_PROVIDER_SITE_OTHER): Payer: Medicaid Other | Admitting: Nurse Practitioner

## 2022-01-01 ENCOUNTER — Encounter (INDEPENDENT_AMBULATORY_CARE_PROVIDER_SITE_OTHER): Payer: Medicaid Other

## 2022-01-01 IMAGING — US US EXTREM LOW VENOUS*R*
1 series · 13 of 24 positions shown · non-contrast
Comparison: None.

CLINICAL DATA: Right knee pain for 3 days

EXAM:
Right LOWER EXTREMITY VENOUS DOPPLER ULTRASOUND
TECHNIQUE: Gray-scale sonography with compression, as well as color and duplex
ultrasound, were performed to evaluate the deep venous system(s)
from the level of the common femoral vein through the popliteal and
proximal calf veins.

[Series 1: us venous img lower uni right (dvt) · portal-venous · 13 of 86 slices shown]
[im 1/86]
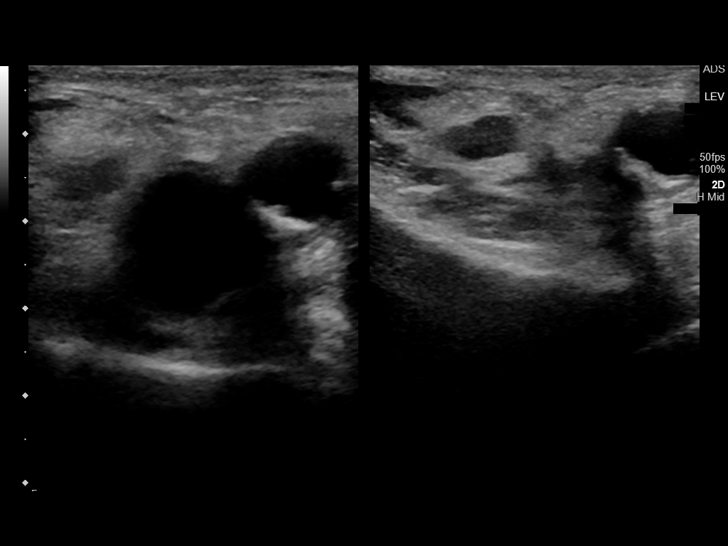
[im 8/86]
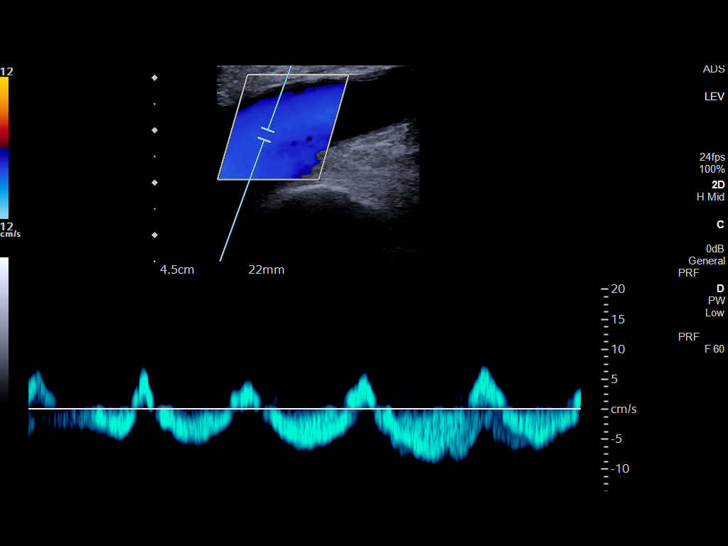
[im 15/86]
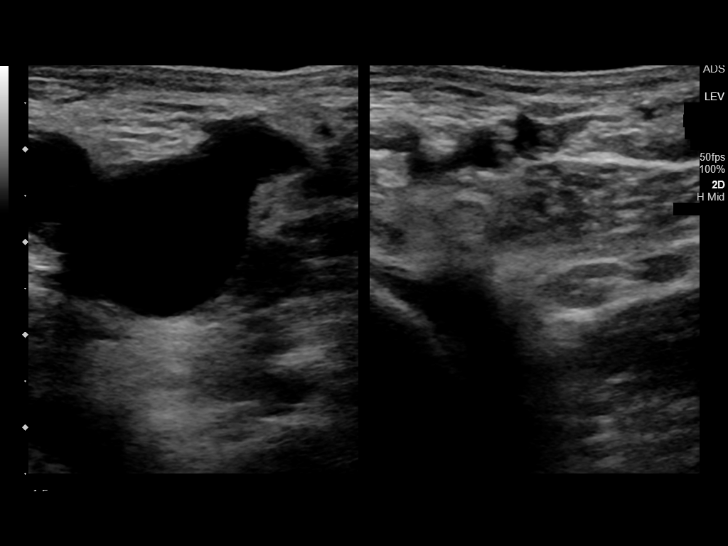
[im 23/86]
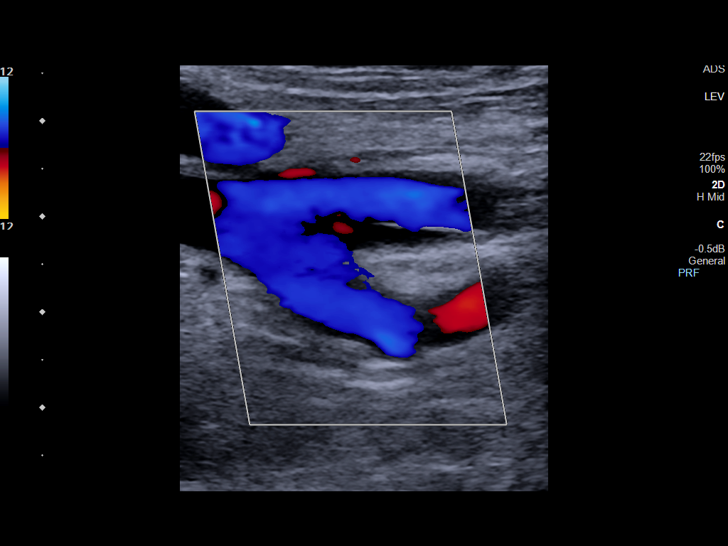
[im 30/86]
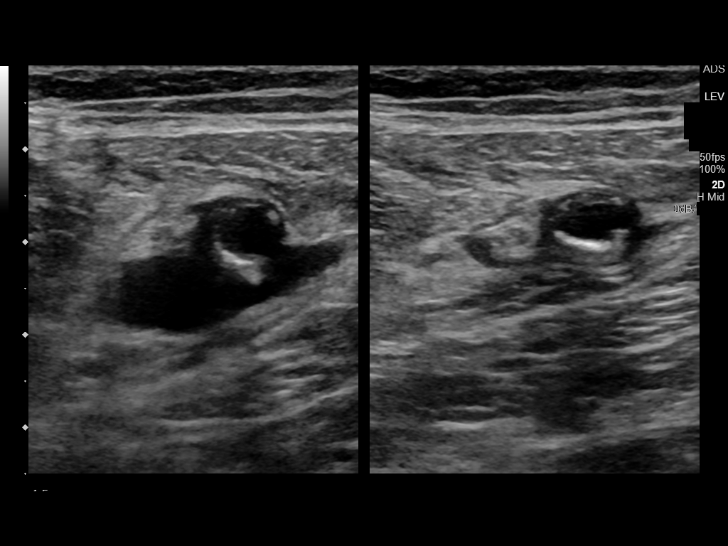
[im 37/86]
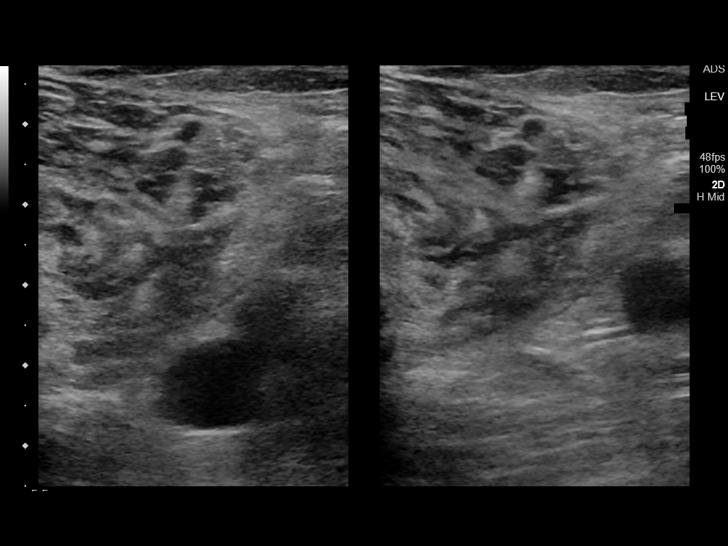
[im 45/86]
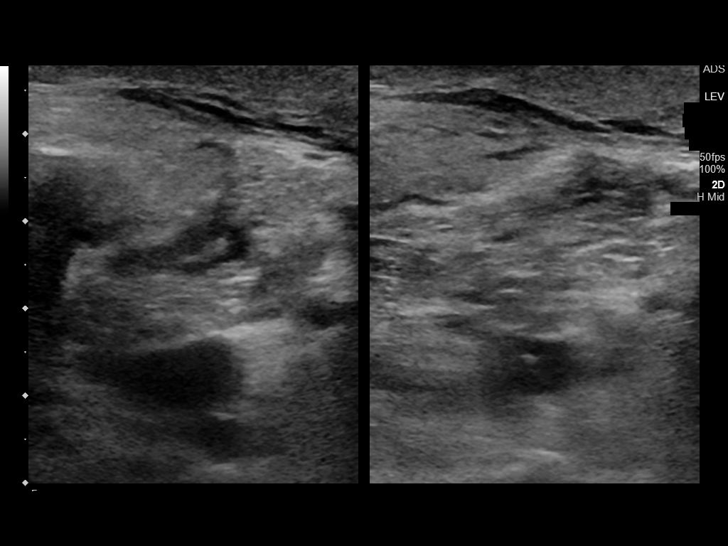
[im 49/86]
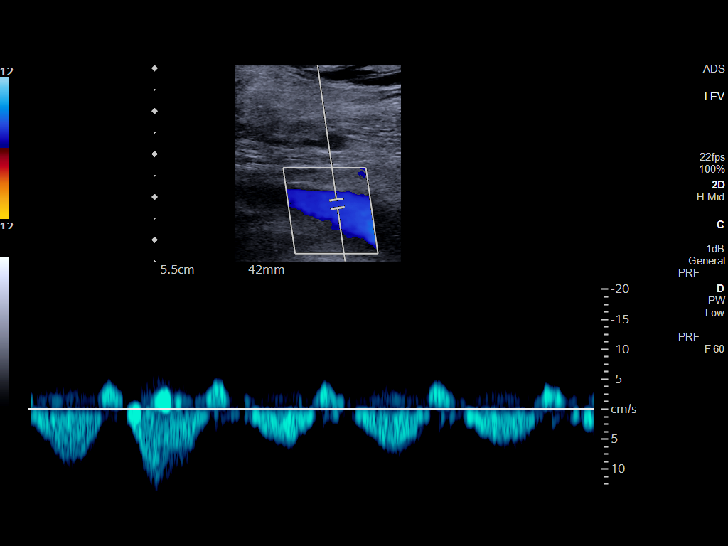
[im 56/86]
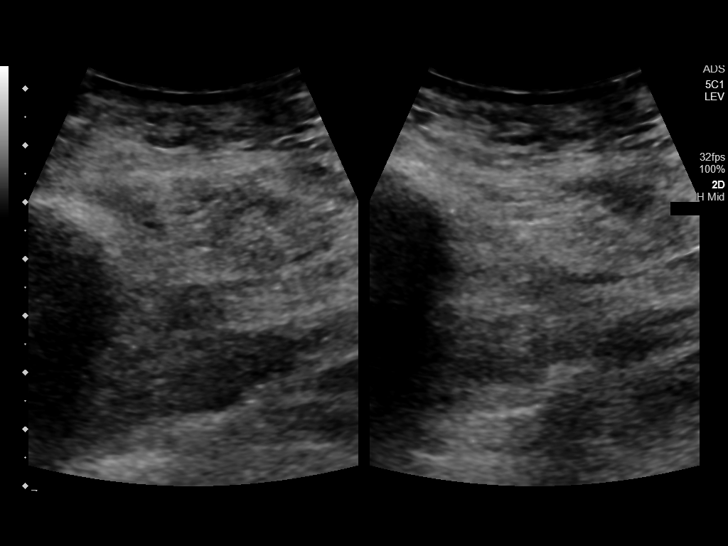
[im 63/86]
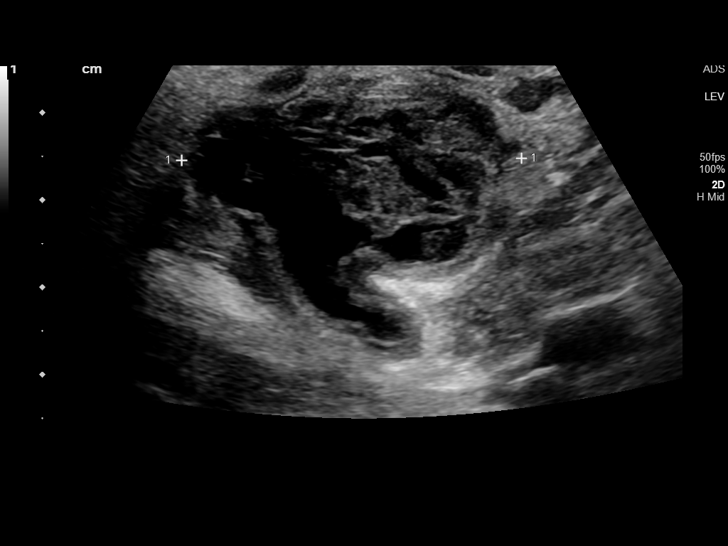
[im 71/86]
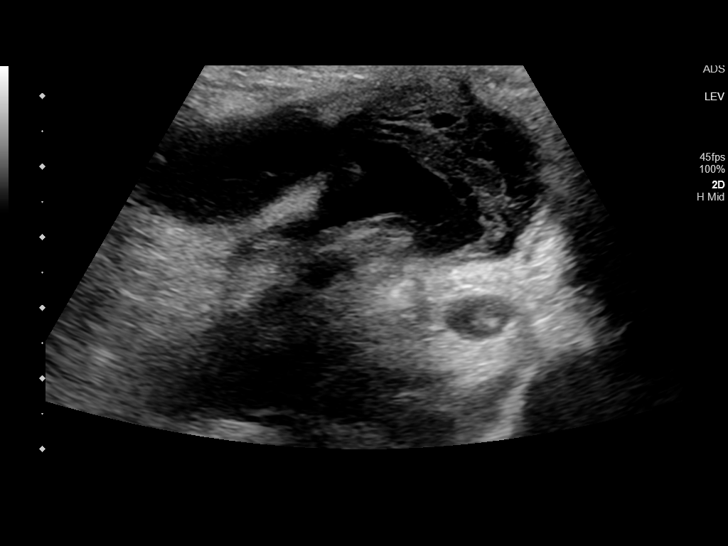
[im 78/86]
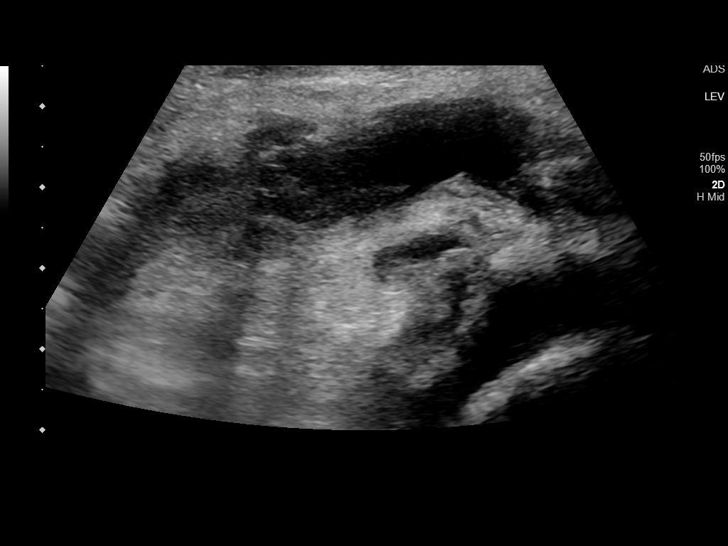
[im 86/86]
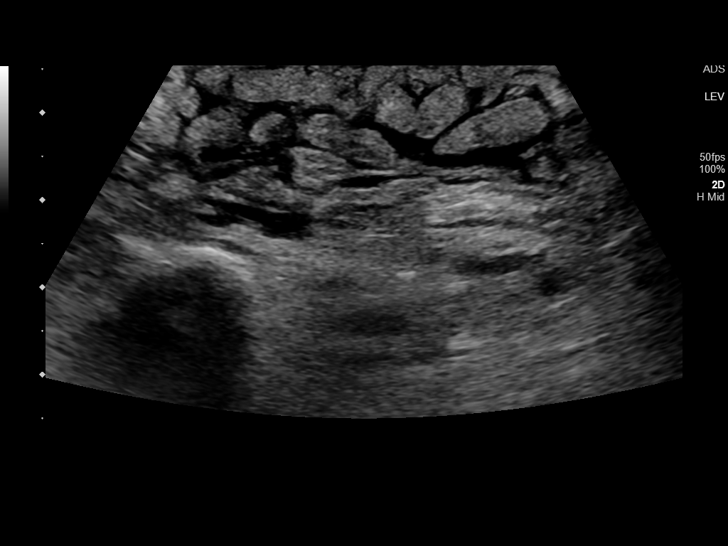

[13 of 24 positions shown; findings below may reference images not displayed]

FINDINGS: VENOUS

Normal compressibility of the common femoral, superficial femoral,
and popliteal veins, as well as the visualized calf veins.
Visualized portions of profunda femoral vein and great saphenous
vein unremarkable. No filling defects to suggest DVT on grayscale or
color Doppler imaging. Doppler waveforms show normal direction of
venous flow, normal respiratory phasicity and response to
augmentation.

Limited views of the contralateral common femoral vein are
unremarkable.

OTHER

Edema within the calf soft tissues. Complex collection at the
popliteal fossa measuring 6.1 x 3.1 x 3.9 cm extends to the medial
knee. No significant internal flow.

Limitations: none
IMPRESSION: No femoropopliteal DVT nor evidence of DVT within the visualized
calf veins.

If clinical symptoms are inconsistent or if there are persistent or
worsening symptoms, further imaging (possibly involving the iliac
veins) may be warranted.

Complex collection within the popliteal fossa measuring 6.1 cm
possibly representing a hematoma or complicated Baker's cyst. There
is edema within the subcutaneous soft tissues of the right calf

## 2022-01-01 NOTE — Progress Notes (Signed)
[facility] Progress Note  Patient: Todd Brown    UEA:[id number]  DOA: 12/15/2021    Date of Service: the patient was seen and examined on 01/01/2022  Brief hospital course: 61 year old male with past medical history of hypertension and end-stage renal disease on hemodialysis presented to the emergency room from his group home/skilled nursing facility on 7/17 with confusion.  (Patient's baseline is that he is unable to care for himself and has a legal guardian.)  Patient last received dialysis on Friday, 7/14.  FPatient found to have markedly elevated blood pressure with a systolic in the 478G requiring Cardene drip and placement of patient to the ICU.  Patient with questionable cardiac ischemia as well.  CT scan of head and neurological exam unremarkable.  Patient was admitted to the hospitalist service and started on his oral antihypertensives, nicardipine drip and given IV labetalol and as needed IV hydralazine.  Nephrology consulted and with plans to resume patient's dialysis  Attempts to wean off Cardene have led to increasing high blood pressure, compounded by patient at first refusing to take oral medications.  Had become more physically agitated, wanting to be left alone.  Seen by psychiatry and started on scheduled and as needed Haldol.  Has become more compliant and better mentating and taking medications and blood pressure improving.  Also developed some periorbital swelling bilaterally felt to be secondary to subcutaneous emphysema and started on steroids, and periorbital swelling has since resolved.  Over time, blood pressure became more manageable.  Patient was supposed to be discharged back to his assisted living facility on 7/21 after dialysis.  His baseline is that he is able to sit in a chair for dialysis although some assistance.  And trying to get him out of bed and placed in a wheelchair, took 3 people and still patient unable to sit and stay in chair.  Discharge canceled and plan  is for patient to have PT/OT evaluation and possible skilled nursing prior to returning to assisted living.  PT and OT are recommending rehab.  Patient with very limited options in terms of excepting skilled nursing facilities.  Found to have some oral thrush.  Started on Magic mouthwash and prednisone was discontinued.  Patient did have to return to stepdown on 7/25 with restarting of Cardene drip which was able to be discontinued by 7/26.  At this time, he is still waiting for skilled nursing.  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  Resolved and now back at baseline.  Had some increased somnolence which improved after stopping his scheduled Benadryl and Haldol.  Hypertensive emergency-resolved as of 12/31/2021 Resolved.  See below.  ESRD on dialysis [facility]) Nephrology consulted, full dialysis session following admission and again today to keep him on his Monday Wednesday Friday schedule. -Continue scheduled dialysis  Mental health disorder Unspecified.  Patient is unable to care for himself.  It is unclear if he is ever had a formal diagnosis.  He is on Tegretol and Artane which he should continue.  As needed Haldol for acute behavior agitation as above.  He is for the most part much more calm and cooperative these days over times, he intermittently refused vitals being checked or taking his medications.  Essential hypertension Resumed on his home p.o. amlodipine 10 mg daily, clonidine 0.3 mg p.o. 3 times daily, hydralazine 100 mg p.o. 3 times daily, isosorbide mononitrate 60 mg p.o. twice daily, losartan 100 mg daily.  Pressures at time improved, but then blood pressure starts to spike again.  As above, changing ARB and adding alpha-blocker. -Nephrology added clonidine patches with p.o. clonidine. -Minoxidil was added and titrated up to 10 mg daily.  Oral thrush Improved with Magic  mouthwash  Subcutaneous emphysema (HCC)-resolved as of 12/18/2021 On steroids.  Appreciate pulmonary assistance.  Subcutaneous emphysema looks to be resolved.  Changed from Solu-Medrol to p.o. prednisone quick taper.  Physical deconditioning PT OT are recommending SNF.  However, this is similar to a previous hospitalization where TOC was unable to find skilled nursing and over time, patient's strength improved that he was able to be discharged back to his assisted living facility.  Malnutrition of moderate degree Nutrition Status: Nutrition Problem: Moderate Malnutrition Etiology: chronic illness (ESRD on HD) Signs/Symptoms: mild fat depletion, mild muscle depletion, moderate muscle depletion Interventions: Refer to RD note for recommendations, Nepro shake, MVI, Prostat          Body mass index is 23.17 kg/m.  Nutrition Problem: Moderate Malnutrition Etiology: chronic illness (ESRD on HD)     Consultants: Nephrology Critical care Psychiatry  Procedures: Hemodialysis   Antimicrobials: None  Code Status: Full code   Subjective: Not interactive today.  Objective: Blood pressures have remained stable Vitals:   12/31/21 2008 01/01/22 0820  BP: 125/62 121/68  Pulse: 62 64  Resp: 17 18  Temp: 98.1 F (36.7 C) 97.6 F (36.4 C)  SpO2: 100% 100%    Intake/Output Summary (Last 24 hours) at 01/01/2022 1152 Last data filed at 01/01/2022 0600 Gross per 24 hour  Intake --  Output 1000 ml  Net -1000 ml     Filed Weights   12/29/21 1230 12/31/21 1236 12/31/21 1638  Weight: 78.3 kg 78.8 kg 77.5 kg   Body mass index is 23.17 kg/m.  Exam:  General: Quiet, no acute distress HEENT: Normocephalic and atraumatic, mucous membranes moist Cardiovascular: Regular rate and rhythm, S1-S2, 2 out of 6 systolic ejection murmur Respiratory: Clear to auscultation bilaterally Abdomen: Soft, nontender, nondistended, hypoactive bowel sounds Musculoskeletal: No clubbing or  cyanosis or edema Skin: No skin breaks, tears or lesions Psychiatry: Appropriate, no evidence of psychoses, flattened affect Neurology: Difficult to assess secondary to compliance  Data Reviewed: No labs today  Disposition:  Status is: Inpatient Remains inpatient appropriate because: Looking for skilled nursing facility options    Anticipated discharge date: 8/7  Family Communication: No family, message left for guardian DVT Prophylaxis: heparin injection 5,000 Units Start: 12/15/21 1645 Place TED hose Start: 12/15/21 1629    Author: Annita Brod ,MD 01/01/2022 11:52 AM  To reach On-call, see care teams to locate the attending and reach out via [email or web address]. Between 7PM-7AM, please contact night-coverage If you still have difficulty reaching the attending provider, please page the [facility] (Director on Call) for [facility] on amion for assistance.

## 2022-01-01 NOTE — Progress Notes (Signed)
Physical Therapy Treatment Patient Details Name: Todd Brown MRN: [id number] DOB: 21-Jul-1960 Today's Date: 01/01/2022   History of Present Illness 61 year old male with past medical history of hypertension and end-stage renal disease on hemodialysis presented to the emergency room from his group home/skilled nursing facility on 7/17 with confusion.  (Patient's baseline is that he is unable to care for himself and has a legal guardian.)  Patient last received dialysis on Friday, 7/14.  From being in the emergency room on 7/17, unable to get dialysis yesterday.  Patient found to have markedly elevated blood pressure with a systolic in the 220U.  Patient with questionable cardiac ischemia as well    PT Comments    Patient was agreeable to PT. He continues to require assistance for bed mobility and transfers. Multiple standing bouts performed from bed and from recliner chair. Standing tolerance of 1 minute. Activity tolerance limited by fatigue and generalized weakness. Recommend to continue PT to maximize independence and decrease caregiver burden.    Recommendations for follow up therapy are one component of a multi-disciplinary discharge planning process, led by the attending physician.  Recommendations may be updated based on patient status, additional functional criteria and insurance authorization.  Follow Up Recommendations  Skilled nursing-short term rehab (<3 hours/day) Can patient physically be transported by private vehicle: No   Assistance Recommended at Discharge Frequent or constant Supervision/Assistance  Patient can return home with the following Two people to help with walking and/or transfers;Two people to help with bathing/dressing/bathroom;Direct supervision/assist for medications management;Direct supervision/assist for financial management;Assistance with cooking/housework;Assist for transportation;Help with stairs or ramp for entrance   Equipment Recommendations  Other  (comment) (to be determined at next level of care)    Recommendations for Other Services       Precautions / Restrictions Precautions Precautions: Fall Precaution Comments: vision imaired Restrictions Weight Bearing Restrictions: No     Mobility  Bed Mobility Overal bed mobility: Needs Assistance Bed Mobility: Supine to Sit, Sit to Supine     Supine to sit: Max assist, Mod assist     General bed mobility comments: assistance for trunk support    Transfers Overall transfer level: Needs assistance Equipment used: Rolling walker (2 wheels) Transfers: Sit to/from Stand, Bed to chair/wheelchair/BSC Sit to Stand: Mod assist, +2 physical assistance Stand pivot transfers: Min assist, +2 physical assistance         General transfer comment: verbal cues for technique, hand placement, initiation of standing. decreased eccentric control noted.    Ambulation/Gait               General Gait Details: not attempted due to poor standing tolerance and fatigue with activity   Stairs             Wheelchair Mobility    Modified Rankin (Stroke Patients Only)       Balance Overall balance assessment: Needs assistance Sitting-balance support: Feet supported Sitting balance-[name]: Fair Sitting balance - Comments: no loss of balance in sitting position   Standing balance support: Bilateral upper extremity supported Standing balance-[name]: Poor Standing balance comment: external support required                            Cognition Arousal/Alertness: Awake/alert Behavior During Therapy: Flat affect Overall Cognitive Status: No family/caregiver present to determine baseline cognitive functioning  General Comments: patient able to follow single step commands with increased time.        Exercises      General Comments        Pertinent Vitals/Pain Pain Assessment Pain Assessment:  No/denies pain    Home Living                          Prior Function            PT Goals (current goals can now be found in the care plan section) Acute Rehab PT Goals Patient Stated Goal: unable to state PT Goal Formulation: With patient Time For Goal Achievement: 01/08/22 Potential to Achieve Goals: Fair Progress towards PT goals: Progressing toward goals    Frequency    Min 2X/week      PT Plan Current plan remains appropriate    Co-evaluation   Reason for Co-Treatment: Complexity of the patient's impairments (multi-system involvement);To address functional/ADL transfers PT goals addressed during session: Mobility/safety with mobility        AM-PAC PT "6 Clicks" Mobility   Outcome Measure  Help needed turning from your back to your side while in a flat bed without using bedrails?: A Lot Help needed moving from lying on your back to sitting on the side of a flat bed without using bedrails?: A Lot Help needed moving to and from a bed to a chair (including a wheelchair)?: Total Help needed standing up from a chair using your arms (e.g., wheelchair or bedside chair)?: Total Help needed to walk in hospital room?: Total Help needed climbing 3-5 steps with a railing? : Total 6 Click Score: 8    End of Session   Activity Tolerance: Patient limited by fatigue Patient left: in chair;with call bell/phone within reach;with chair alarm set Nurse Communication: Mobility status PT Visit Diagnosis: Other abnormalities of gait and mobility (R26.89);Muscle weakness (generalized) (M62.81);Unsteadiness on feet (R26.81)     Time: 2774-1287 PT Time Calculation (min) (ACUTE ONLY): 26 min  Charges:  $Therapeutic Activity: 8-22 mins                    [name], PT, MPT    [name] 01/01/2022, 12:40 PM

## 2022-01-01 NOTE — Progress Notes (Signed)
Occupational Therapy Treatment Patient Details Name: Todd Brown MRN: [id number] DOB: 1960/10/04 Today's Date: 01/01/2022   History of present illness 61 year old male with past medical history of hypertension and end-stage renal disease on hemodialysis presented to the emergency room from his group home/skilled nursing facility on 7/17 with confusion.  (Patient's baseline is that he is unable to care for himself and has a legal guardian.)  Patient last received dialysis on Friday, 7/14.  From being in the emergency room on 7/17, unable to get dialysis yesterday.  Patient found to have markedly elevated blood pressure with a systolic in the 213Y.  Patient with questionable cardiac ischemia as well   OT comments  Chart reviewed, pt greeted in bed agreeable to OT tx session. Co tx completed with PT on this date. Tx session targeted progressing improving tolerance for functional mobility, improved participation in ADL tasks. Improvements noted in bed mobility, STS, and functional transfers on this date requiring MIN -MOD A. Pt able to participate in dynamic standing task to complete peri care with MOD A for thoroughness. Pt left in bedside chair, NAD, all needs met. Pt continues to perform below PLOF, will benefit from STR to address deficits. OT will continue to follow acutely.    Recommendations for follow up therapy are one component of a multi-disciplinary discharge planning process, led by the attending physician.  Recommendations may be updated based on patient status, additional functional criteria and insurance authorization.    Follow Up Recommendations  Skilled nursing-short term rehab (<3 hours/day)    Assistance Recommended at Discharge Frequent or constant Supervision/Assistance  Patient can return home with the following  A lot of help with bathing/dressing/bathroom;A lot of help with walking and/or transfers   Equipment Recommendations  Other (comment) (defer)    Recommendations  for Other Services      Precautions / Restrictions Precautions Precautions: Fall Precaution Comments: vision impaired Restrictions Weight Bearing Restrictions: No       Mobility Bed Mobility Overal bed mobility: Needs Assistance Bed Mobility: Supine to Sit     Supine to sit: Max assist, Mod assist          Transfers Overall transfer level: Needs assistance Equipment used: Rolling walker (2 wheels) Transfers: Sit to/from Stand Sit to Stand: Mod assist, +2 physical assistance (MOD A +2 1 attempt, MIN-MOD A +2 1 attempt, MIN A +2 1 attempt) Stand pivot transfers: Min assist, +2 physical assistance         General transfer comment: step by step vcs for technique     Balance Overall balance assessment: Needs assistance Sitting-balance support: Feet supported Sitting balance-[name]: Fair   Postural control: Right lateral lean Standing balance support: Bilateral upper extremity supported Standing balance-[name]: Poor                             ADL either performed or assessed with clinical judgement   ADL Overall ADL's : Needs assistance/impaired                 Upper Body Dressing : Moderate assistance Upper Body Dressing Details (indicate cue type and reason): gown , step by step vcs for technique due to vision impairment Lower Body Dressing: Maximal assistance Lower Body Dressing Details (indicate cue type and reason): socks due to vision impairment     Toileting- Clothing Manipulation and Hygiene: Moderate assistance;Sit to/from stand Toileting - Clothing Manipulation Details (indicate cue type and reason): for thoroughness,  pt able to attempt to self clean with MIN A during dynamic standing tasks            Extremity/Trunk Assessment              Vision       Perception     Praxis      Cognition Arousal/Alertness: Awake/alert Behavior During Therapy: Flat affect Overall Cognitive Status: No family/caregiver  present to determine baseline cognitive functioning Area of Impairment: Orientation, Attention, Following commands, Safety/judgement, Awareness, Problem solving                 Orientation Level: Disoriented to, Place, Time, Situation Current Attention Level: Focused   Following Commands: Follows one step commands with increased time Safety/Judgement: Decreased awareness of safety, Decreased awareness of deficits Awareness: Intellectual Problem Solving: Slow processing, Decreased initiation, Difficulty sequencing, Requires verbal cues, Requires tactile cues          Exercises      Shoulder Instructions       General Comments      Pertinent Vitals/ Pain       Pain Assessment Pain Assessment: No/denies pain  Home Living                                          Prior Functioning/Environment              Frequency  Min 2X/week        Progress Toward Goals  OT Goals(current goals can now be found in the care plan section)  Progress towards OT goals: Progressing toward goals     Plan Discharge plan remains appropriate;Frequency remains appropriate    Co-evaluation    PT/OT/SLP Co-Evaluation/Treatment: Yes Reason for Co-Treatment: To address functional/ADL transfers;Complexity of the patient's impairments (multi-system involvement) PT goals addressed during session: Mobility/safety with mobility OT goals addressed during session: ADL's and self-care      AM-PAC OT "6 Clicks" Daily Activity     Outcome Measure   Help from another person eating meals?: A Little Help from another person taking care of personal grooming?: A Little Help from another person toileting, which includes using toliet, bedpan, or urinal?: A Lot Help from another person bathing (including washing, rinsing, drying)?: A Lot Help from another person to put on and taking off regular upper body clothing?: A Little Help from another person to put on and taking off  regular lower body clothing?: A Lot 6 Click Score: 15    End of Session Equipment Utilized During Treatment: Rolling walker (2 wheels);Oxygen  OT Visit Diagnosis: Unsteadiness on feet (R26.81);Muscle weakness (generalized) (M62.81)   Activity Tolerance Patient tolerated treatment well   Patient Left in chair;with call bell/phone within reach;with chair alarm set   Nurse Communication Mobility status        Time: 0071-2197 OT Time Calculation (min): 24 min  Charges: OT General Charges $OT Visit: 1 Visit OT Treatments $Self Care/Home Management : 8-22 mins  Shanon Payor, OTD OTR/L  01/01/22, 1:11 PM

## 2022-01-01 NOTE — Progress Notes (Signed)
Patient refused 4am vital signs. RN notified.

## 2022-01-01 NOTE — Progress Notes (Signed)
[facility]  ROUNDING NOTE   Subjective:   Todd Brown is a 61 year old male with past medical history including hypertension, anemia, schizophrenia and end-stage renal disease on hemodialysis.  Patient has presented to the ED with altered mental status.  Patient has been admitted for Altered mental status [R41.82] Hypertensive urgency [I16.0] Severe hypertension [I10] Altered mental status, unspecified altered mental status type [R41.82]  Patient is known to our practice and receives outpatient dialysis treatments at [facility] on a MWF schedule.  Update Patient sitting up in bed Alert and able to answer simple questions Tolerating meals without nausea and vomiting No lower extremity edema  Objective:  Vital signs in last 24 hours:  Temp:  [97.6 F (36.4 C)-98.1 F (36.7 C)] 97.6 F (36.4 C) (08/03 0820) Pulse Rate:  [60-67] 64 (08/03 0820) Resp:  [8-19] 18 (08/03 0820) BP: (92-125)/(49-68) 121/68 (08/03 0820) SpO2:  [99 %-100 %] 100 % (08/03 0820) Weight:  [77.5 kg] 77.5 kg (08/02 1638)  Weight change:  Filed Weights   12/29/21 1230 12/31/21 1236 12/31/21 1638  Weight: 78.3 kg 78.8 kg 77.5 kg    Intake/Output: I/O last 3 completed shifts: In: 240 [P.O.:240] Out: 1000 [Other:1000]   Intake/Output this shift:  No intake/output data recorded.  Physical Exam: General: NAD  Head: Normocephalic, atraumatic. Moist oral mucosal membranes  Eyes: Anicteric  Lungs:  Clear to auscultation, normal effort, room air  Heart: Regular rate and rhythm, + gallop  Abdomen:  Soft, nontender, non distended  Extremities:  No peripheral edema.  Neurologic: Alert, answer simple questions  Skin: No lesions  Access: Lt AVF    Basic Metabolic Panel: Recent Labs  Lab 12/26/21 1310 12/29/21 0846 12/30/21 0827  NA 135 133* 133*  K 4.5 5.8* 4.8  CL 95* 96* 98  CO2 31 29 27   GLUCOSE 90 73 78  BUN 43* 48* 33*  CREATININE 5.12* 5.81* 4.39*  CALCIUM 9.5 9.6  9.0  PHOS 1.8* 2.1*  --      Liver Function Tests: Recent Labs  Lab 12/26/21 1310 12/29/21 0846  ALBUMIN 3.2* 3.0*    No results for input(s): "LIPASE", "AMYLASE" in the last 168 hours. No results for input(s): "AMMONIA" in the last 168 hours.  CBC: Recent Labs  Lab 12/26/21 1310 12/29/21 0846  WBC 4.6 3.6*  HGB 9.2* 8.2*  HCT 26.6* 24.2*  MCV 96.0 97.2  PLT 154 178     Cardiac Enzymes: No results for input(s): "CKTOTAL", "CKMB", "CKMBINDEX", "TROPONINI" in the last 168 hours.  BNP: Invalid input(s): "POCBNP"  CBG: No results for input(s): "GLUCAP" in the last 168 hours.   Microbiology: Results for orders placed or performed during the hospital encounter of 12/15/21  SARS Coronavirus 2 by RT PCR (hospital order, performed in [facility] hospital lab) *cepheid single result test* Anterior Nasal Swab     Status: None   Collection Time: 12/15/21  5:50 PM   Specimen: Anterior Nasal Swab  Result Value Ref Range Status   SARS Coronavirus 2 by RT PCR NEGATIVE NEGATIVE Final    Comment: (NOTE) SARS-CoV-2 target nucleic acids are NOT DETECTED.  The SARS-CoV-2 RNA is generally detectable in upper and lower respiratory specimens during the acute phase of infection. The lowest concentration of SARS-CoV-2 viral copies this assay can detect is 250 copies / mL. A negative result does not preclude SARS-CoV-2 infection and should not be used as the sole basis for treatment or other patient management decisions.  A negative result  may occur with improper specimen collection / handling, submission of specimen other than nasopharyngeal swab, presence of viral mutation(s) within the areas targeted by this assay, and inadequate number of viral copies (<250 copies / mL). A negative result must be combined with clinical observations, patient history, and epidemiological information.  Fact Sheet for Patients:   https://www.patel.info/  Fact Sheet for  Healthcare Providers: https://hall.com/  This test is not yet approved or  cleared by the [place] FDA and has been authorized for detection and/or diagnosis of SARS-CoV-2 by FDA under an Emergency Use Authorization (EUA).  This EUA will remain in effect (meaning this test can be used) for the duration of the COVID-19 declaration under Section 564(b)(1) of the Act, 21 U.S.C. section 360bbb-3(b)(1), unless the authorization is terminated or revoked sooner.  Performed at [facility], [address] [place], [place] [address]   MRSA Next Gen by PCR, Nasal     Status: None   Collection Time: 12/15/21  9:03 PM   Specimen: Nasal Mucosa; Nasal Swab  Result Value Ref Range Status   MRSA by PCR Next Gen NOT DETECTED NOT DETECTED Final    Comment: (NOTE) The GeneXpert MRSA Assay (FDA approved for NASAL specimens only), is one component of a comprehensive MRSA colonization surveillance program. It is not intended to diagnose MRSA infection nor to guide or monitor treatment for MRSA infections. Test performance is not FDA approved in patients less than 39 years old. Performed at [facility], [address] [place], [place] [address]     Coagulation Studies: No results for input(s): "LABPROT", "INR" in the last 72 hours.  Urinalysis: No results for input(s): "COLORURINE", "LABSPEC", "PHURINE", "GLUCOSEU", "HGBUR", "BILIRUBINUR", "KETONESUR", "PROTEINUR", "UROBILINOGEN", "NITRITE", "LEUKOCYTESUR" in the last 72 hours.  Invalid input(s): "APPERANCEUR"    Imaging: No results found.   Medications:    anticoagulant sodium citrate      (feeding supplement) PROSource Plus  30 mL Oral TID BM   amLODipine  10 mg Oral q morning   calcium acetate  667 mg Oral TID WC   carbamazepine  1,000 mg Oral q AM   Chlorhexidine Gluconate Cloth  6 each Topical Daily   cloNIDine  0.2 mg Transdermal Weekly   doxazosin  4 mg Oral QHS   epoetin  (EPOGEN/PROCRIT) injection  10,000 Units Intravenous Q M,W,F-HD   ferrous sulfate  325 mg Oral Q breakfast   fluPHENAZine  2.5 mg Oral q AM   heparin  5,000 Units Subcutaneous Q8H   hydrALAZINE  100 mg Oral TID   irbesartan  300 mg Oral Daily   isosorbide mononitrate  60 mg Oral BID   magic mouthwash  10 mL Oral QID   minoxidil  10 mg Oral Daily   mometasone-formoterol  2 puff Inhalation BID   multivitamin  1 tablet Oral QHS   pantoprazole  40 mg Oral Daily   polyethylene glycol  17 g Oral BID   trihexyphenidyl  2 mg Oral BID WC   albuterol, alteplase, anticoagulant sodium citrate, diphenhydrAMINE, haloperidol lactate, heparin, hydrALAZINE, lidocaine (PF), lidocaine-prilocaine, ondansetron **OR** ondansetron (ZOFRAN) IV, mouth rinse, pentafluoroprop-tetrafluoroeth, polyethylene glycol, senna-docusate  Assessment/ Plan:  [name] is a 61 y.o.  male with past medical history including hypertension, anemia, schizophrenia and end-stage renal disease on hemodialysis.  Patient has presented to the ED with altered mental status.  Patient has been admitted for Altered mental status [R41.82] Hypertensive urgency [I16.0] Severe hypertension [I10] Altered mental status, unspecified altered  mental status type [R41.82]  [facility] [place] [facility]/MWF/Lt AVF  End-stage renal disease on hemodialysis.  Will maintain outpatient schedule if possible.   Next treatment scheduled for Friday.   Renal navigator continues to monitor discharge plan to determine outpatient needs.     2. Anemia of chronic kidney disease Lab Results  Component Value Date   HGB 8.2 (L) 12/29/2021    Mircera prescribed outpatient. EPO with dialysis   3. Secondary Hyperparathyroidism:  Lab Results  Component Value Date   CALCIUM 9.0 12/30/2021   PHOS 2.1 (L) 12/29/2021   Calcium at goal and phosphorus decreased. Will draw updated labs in am.   4.  Hypertension with chronic kidney disease.  Home regimen includes  amlodipine, clonidine, hydralazine, isosorbide, and losartan.    Currently on amlodipine 10 mg daily, clonidine patch 0.2 mg per 24 hours, Cardura 4 mg daily at bedtime, hydralazine 100 mg 3 times per day, Avapro 300 mg daily, isosorbide 60 mg twice a day, minoxidil 10 mg daily.       LOS: [place] 8/3/202312:56 PM

## 2022-01-01 NOTE — TOC Progression Note (Signed)
Transition of Care [facility]) - Progression Note    Patient Details  Name: Todd Brown MRN: [id number] Date of Birth: [birth year]  Transition of Care [facility]) CM/SW [place], RN Phone Number: 01/01/2022, 11:20 AM  Clinical Narrative:   No bed offers today.  RNCM discussed with hospitalist.  PT will work with patient for strenthening with a goal of home with Home health in the future.  RNCM will continue to monitor and inquire about SNF beds in the event one becomes available.    Expected Discharge Plan: [place] Barriers to Discharge: Continued Medical Work up  Expected Discharge Plan and Services Expected Discharge Plan: [place] arrangements for the past 2 months: [place] Expected Discharge Date: 12/19/21                                     Social Determinants of Health (SDOH) Interventions    Readmission Risk Interventions     No data to display

## 2022-01-02 DIAGNOSIS — B370 Candidal stomatitis: Secondary | ICD-10-CM | POA: Diagnosis not present

## 2022-01-02 DIAGNOSIS — G9341 Metabolic encephalopathy: Secondary | ICD-10-CM | POA: Diagnosis not present

## 2022-01-02 DIAGNOSIS — I10 Essential (primary) hypertension: Secondary | ICD-10-CM | POA: Diagnosis not present

## 2022-01-02 DIAGNOSIS — N186 End stage renal disease: Secondary | ICD-10-CM | POA: Diagnosis not present

## 2022-01-02 LAB — RENAL FUNCTION PANEL
Albumin: 2.4 g/dL — ABNORMAL LOW (ref 3.5–5.0)
Anion gap: 7 (ref 5–15)
BUN: 41 mg/dL — ABNORMAL HIGH (ref 8–23)
CO2: 30 mmol/L (ref 22–32)
Calcium: 9.3 mg/dL (ref 8.9–10.3)
Chloride: 98 mmol/L (ref 98–111)
Creatinine, Ser: 5.26 mg/dL — ABNORMAL HIGH (ref 0.61–1.24)
GFR, Estimated: 12 mL/min — ABNORMAL LOW (ref >60)
Glucose, Bld: 74 mg/dL (ref 70–99)
Phosphorus: 2.3 mg/dL — ABNORMAL LOW (ref 2.5–4.6)
Potassium: 4.4 mmol/L (ref 3.5–5.1)
Sodium: 135 mmol/L (ref 135–145)

## 2022-01-02 LAB — CBC
HCT: 22.4 % — ABNORMAL LOW (ref 39.0–52.0)
Hemoglobin: 7.8 g/dL — ABNORMAL LOW (ref 13.0–17.0)
MCH: 33.6 pg (ref 26.0–34.0)
MCHC: 34.8 g/dL (ref 30.0–36.0)
MCV: 96.6 fL (ref 80.0–100.0)
Platelets: 192 10*3/uL (ref 150–400)
RBC: 2.32 MIL/uL — ABNORMAL LOW (ref 4.22–5.81)
RDW: 13.7 % (ref 11.5–15.5)
WBC: 6.2 10*3/uL (ref 4.0–10.5)
nRBC: 0 % (ref 0.0–0.2)

## 2022-01-02 MED ORDER — EPOETIN ALFA 10000 UNIT/ML IJ SOLN
INTRAMUSCULAR | Status: AC
  Filled 2022-01-02: qty 1

## 2022-01-02 MED ORDER — HYDRALAZINE HCL 50 MG PO TABS
50.0000 mg | ORAL_TABLET | Freq: Three times a day (TID) | ORAL | Status: DC
  Filled 2022-01-02 (×2): qty 1

## 2022-01-02 NOTE — Progress Notes (Signed)
Pre HD RN assessment 

## 2022-01-02 NOTE — Progress Notes (Signed)
Pt sbp<80, B[place], NP present and aware. Ordered 250ml NS bolus. Administered.

## 2022-01-02 NOTE — Progress Notes (Signed)
Pt HD tx abruptly ended due to patient sitting up in bed suddenly, trying to pull at lines and attempting to get out of bed. Staff ended HD tx 20 minutes early for patient safety. 637ml fluid removed, 65.3L BVP, report to primary RN. Pt currently resting calmly and comfortably in bed.

## 2022-01-02 NOTE — Progress Notes (Signed)
Uf still off due to lower blood pressure on patient, patient bfr turned down to 350 due to high venous pressures. I was able to successfully reposition the needles push and pull was fine, pressures are still elevated at 400 BFR but stable at 350. Will continue to monitor the patient closely.

## 2022-01-02 NOTE — Progress Notes (Signed)
Pt UF off d/t sbp of 83/57. Gwyneth Revels, NP present and aware. Pt resting comfortably, safety maintained.

## 2022-01-02 NOTE — Progress Notes (Signed)
[facility] Progress Note  Patient: Todd Brown    ZOX:[id number]  DOA: 12/15/2021    Date of Service: the patient was seen and examined on 01/02/2022  Brief hospital course: 61 year old male with past medical history of hypertension and end-stage renal disease on hemodialysis presented to the emergency room from his group home/skilled nursing facility on 7/17 with confusion.  (Patient's baseline is that he is unable to care for himself and has a legal guardian.)  Patient last received dialysis on Friday, 7/14.  FPatient found to have markedly elevated blood pressure with a systolic in the 811B requiring Cardene drip and placement of patient to the ICU.  Patient with questionable cardiac ischemia as well.  CT scan of head and neurological exam unremarkable.  Patient was admitted to the hospitalist service and started on his oral antihypertensives, nicardipine drip and given IV labetalol and as needed IV hydralazine.  Nephrology consulted and with plans to resume patient's dialysis  Attempts to wean off Cardene have led to increasing high blood pressure, compounded by patient at first refusing to take oral medications.  Had become more physically agitated, wanting to be left alone.  Seen by psychiatry and started on scheduled and as needed Haldol.  Has become more compliant and better mentating and taking medications and blood pressure improving.  Also developed some periorbital swelling bilaterally felt to be secondary to subcutaneous emphysema and started on steroids, and periorbital swelling has since resolved.  Over time, blood pressure became more manageable.  Patient was supposed to be discharged back to his assisted living facility on 7/21 after dialysis.  His baseline is that he is able to sit in a chair for dialysis although some assistance.  And trying to get him out of bed and placed in a wheelchair, took 3 people and still patient unable to sit and stay in chair.  Discharge canceled and plan  is for patient to have PT/OT evaluation and possible skilled nursing prior to returning to assisted living.  PT and OT are recommending rehab.  Patient with very limited options in terms of excepting skilled nursing facilities.  Found to have some oral thrush.  Started on Magic mouthwash and prednisone was discontinued.  Patient did have to return to stepdown on 7/25 with restarting of Cardene drip which was able to be discontinued by 7/26.  At this time, he is still waiting for skilled nursing.  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  Resolved and now back at baseline.  Had some increased somnolence which improved after stopping his scheduled Benadryl and Haldol.  Hypertensive emergency-resolved as of 12/31/2021 Resolved.  See below.  ESRD on dialysis [facility]) Nephrology consulted, full dialysis session following admission and again today to keep him on his Monday Wednesday Friday schedule. -Continue scheduled dialysis  Mental health disorder Unspecified.  Patient is unable to care for himself.  It is unclear if he is ever had a formal diagnosis.  He is on Tegretol and Artane which he should continue.  As needed Haldol for acute behavior agitation as above.  He is for the most part much more calm and cooperative these days over times, he intermittently refused vitals being checked or taking his medications.  Essential hypertension Resumed on his home p.o. amlodipine 10 mg daily, clonidine 0.3 mg p.o. 3 times daily, hydralazine 100 mg p.o. 3 times daily, isosorbide mononitrate 60 mg p.o. twice daily, losartan 100 mg daily.  Pressures at time improved, but then blood pressure starts to spike again.  As above, changing ARB and adding alpha-blocker. -Nephrology added clonidine patches with p.o. clonidine. -Minoxidil was added and titrated up to 10 mg daily. Occasionally having episodes of lower  blood pressure.  May need to scale back if needed  Oral thrush-resolved as of 01/02/2022 Improved with Magic mouthwash  Subcutaneous emphysema (HCC)-resolved as of 12/18/2021 On steroids.  Appreciate pulmonary assistance.  Subcutaneous emphysema looks to be resolved.  Changed from Solu-Medrol to p.o. prednisone quick taper.  Physical deconditioning PT OT are recommending SNF.  However, this is similar to a previous hospitalization where TOC was unable to find skilled nursing and over time, patient's strength improved that he was able to be discharged back to his assisted living facility.  Malnutrition of moderate degree Nutrition Status: Nutrition Problem: Moderate Malnutrition Etiology: chronic illness (ESRD on HD) Signs/Symptoms: mild fat depletion, mild muscle depletion, moderate muscle depletion Interventions: Refer to RD note for recommendations, Nepro shake, MVI, Prostat          Body mass index is 23.17 kg/m.  Nutrition Problem: Moderate Malnutrition Etiology: chronic illness (ESRD on HD)     Consultants: Nephrology Critical care Psychiatry  Procedures: Hemodialysis   Antimicrobials: None  Code Status: Full code   Subjective: Patient doing okay.  No complaints  Objective: Blood pressures have remained stable Vitals:   01/02/22 0604 01/02/22 0827  BP: 131/68 123/65  Pulse: 68 68  Resp: 19 17  Temp: 97.8 F (36.6 C) 97.9 F (36.6 C)  SpO2: 98% 100%   No intake or output data in the 24 hours ending 01/02/22 1230   Filed Weights   12/29/21 1230 12/31/21 1236 12/31/21 1638  Weight: 78.3 kg 78.8 kg 77.5 kg   Body mass index is 23.17 kg/m.  Exam:  General: Remains quiet, minimal verbiage HEENT: Normocephalic and atraumatic, mucous membranes moist Cardiovascular: Regular rate and rhythm, S1-S2, 2 out of 6 systolic ejection murmur Respiratory: Clear to auscultation bilaterally Abdomen: Soft, nontender, nondistended, hypoactive bowel  sounds Musculoskeletal: No clubbing or cyanosis or edema Skin: No skin breaks, tears or lesions Psychiatry: Appropriate, no evidence of psychoses, flattened affect Neurology: Difficult to assess secondary to compliance  Data Reviewed: Renal function as expected.  Albumin of 2.4.  Hemoglobin is 7.8.  Disposition:  Status is: Inpatient Remains inpatient appropriate because: Looking for skilled nursing facility options    Anticipated discharge date: 8/8  Family Communication: No family, message left for guardian DVT Prophylaxis: heparin injection 5,000 Units Start: 12/15/21 1645 Place TED hose Start: 12/15/21 1629    Author: Annita Brod ,MD 01/02/2022 12:30 PM  To reach On-call, see care teams to locate the attending and reach out via [email or web address]. Between 7PM-7AM, please contact night-coverage If you still have difficulty reaching the attending provider, please page the [facility] (Director on Call) for [facility] on amion for assistance.

## 2022-01-02 NOTE — Progress Notes (Signed)
[facility]  ROUNDING NOTE   Subjective:   Todd Brown is a 61 year old male with past medical history including hypertension, anemia, schizophrenia and end-stage renal disease on hemodialysis.  Patient has presented to the ED with altered mental status.  Patient has been admitted for Altered mental status [R41.82] Hypertensive urgency [I16.0] Severe hypertension [I10] Altered mental status, unspecified altered mental status type [R41.82]  Patient is known to our practice and receives outpatient dialysis treatments at [facility] on a MWF schedule.  Update Patient seen sitting up in bed. Tolerating meals without nausea and vomiting Remains on room air No lower extremity edema  Dialysis scheduled for later today  Objective:  Vital signs in last 24 hours:  Temp:  [97.6 F (36.4 C)-98.8 F (37.1 C)] 97.8 F (36.6 C) (08/04 1244) Pulse Rate:  [63-68] 66 (08/04 1330) Resp:  [10-19] 13 (08/04 1330) BP: (78-131)/(48-68) 84/48 (08/04 1400) SpO2:  [96 %-100 %] 98 % (08/04 1330) Weight:  [81.4 kg] 81.4 kg (08/04 1244)  Weight change:  Filed Weights   12/31/21 1236 12/31/21 1638 01/02/22 1244  Weight: 78.8 kg 77.5 kg 81.4 kg    Intake/Output: No intake/output data recorded.   Intake/Output this shift:  No intake/output data recorded.  Physical Exam: General: NAD  Head: Normocephalic, atraumatic. Moist oral mucosal membranes  Eyes: Anicteric  Lungs:  Clear to auscultation, normal effort, room air  Heart: Regular rate and rhythm, + gallop  Abdomen:  Soft, nontender, non distended  Extremities:  No peripheral edema.  Neurologic: Alert, answer simple questions  Skin: No lesions  Access: Lt AVF    Basic Metabolic Panel: Recent Labs  Lab 12/29/21 0846 12/30/21 0827 01/02/22 0508  NA 133* 133* 135  K 5.8* 4.8 4.4  CL 96* 98 98  CO2 29 27 30   GLUCOSE 73 78 74  BUN 48* 33* 41*  CREATININE 5.81* 4.39* 5.26*  CALCIUM 9.6 9.0 9.3  PHOS 2.1*  --   2.3*     Liver Function Tests: Recent Labs  Lab 12/29/21 0846 01/02/22 0508  ALBUMIN 3.0* 2.4*    No results for input(s): "LIPASE", "AMYLASE" in the last 168 hours. No results for input(s): "AMMONIA" in the last 168 hours.  CBC: Recent Labs  Lab 12/29/21 0846 01/02/22 0508  WBC 3.6* 6.2  HGB 8.2* 7.8*  HCT 24.2* 22.4*  MCV 97.2 96.6  PLT 178 192     Cardiac Enzymes: No results for input(s): "CKTOTAL", "CKMB", "CKMBINDEX", "TROPONINI" in the last 168 hours.  BNP: Invalid input(s): "POCBNP"  CBG: No results for input(s): "GLUCAP" in the last 168 hours.   Microbiology: Results for orders placed or performed during the hospital encounter of 12/15/21  SARS Coronavirus 2 by RT PCR (hospital order, performed in [facility] hospital lab) *cepheid single result test* Anterior Nasal Swab     Status: None   Collection Time: 12/15/21  5:50 PM   Specimen: Anterior Nasal Swab  Result Value Ref Range Status   SARS Coronavirus 2 by RT PCR NEGATIVE NEGATIVE Final    Comment: (NOTE) SARS-CoV-2 target nucleic acids are NOT DETECTED.  The SARS-CoV-2 RNA is generally detectable in upper and lower respiratory specimens during the acute phase of infection. The lowest concentration of SARS-CoV-2 viral copies this assay can detect is 250 copies / mL. A negative result does not preclude SARS-CoV-2 infection and should not be used as the sole basis for treatment or other patient management decisions.  A negative result may occur with  improper specimen collection / handling, submission of specimen other than nasopharyngeal swab, presence of viral mutation(s) within the areas targeted by this assay, and inadequate number of viral copies (<250 copies / mL). A negative result must be combined with clinical observations, patient history, and epidemiological information.  Fact Sheet for Patients:   https://www.patel.info/  Fact Sheet for Healthcare  Providers: https://hall.com/  This test is not yet approved or  cleared by the [place] FDA and has been authorized for detection and/or diagnosis of SARS-CoV-2 by FDA under an Emergency Use Authorization (EUA).  This EUA will remain in effect (meaning this test can be used) for the duration of the COVID-19 declaration under Section 564(b)(1) of the Act, 21 U.S.C. section 360bbb-3(b)(1), unless the authorization is terminated or revoked sooner.  Performed at [facility], [address] [place], [place] [address]   MRSA Next Gen by PCR, Nasal     Status: None   Collection Time: 12/15/21  9:03 PM   Specimen: Nasal Mucosa; Nasal Swab  Result Value Ref Range Status   MRSA by PCR Next Gen NOT DETECTED NOT DETECTED Final    Comment: (NOTE) The GeneXpert MRSA Assay (FDA approved for NASAL specimens only), is one component of a comprehensive MRSA colonization surveillance program. It is not intended to diagnose MRSA infection nor to guide or monitor treatment for MRSA infections. Test performance is not FDA approved in patients less than 48 years old. Performed at [facility], [address] [place], [place] [address]     Coagulation Studies: No results for input(s): "LABPROT", "INR" in the last 72 hours.  Urinalysis: No results for input(s): "COLORURINE", "LABSPEC", "PHURINE", "GLUCOSEU", "HGBUR", "BILIRUBINUR", "KETONESUR", "PROTEINUR", "UROBILINOGEN", "NITRITE", "LEUKOCYTESUR" in the last 72 hours.  Invalid input(s): "APPERANCEUR"    Imaging: No results found.   Medications:    anticoagulant sodium citrate      (feeding supplement) PROSource Plus  30 mL Oral TID BM   amLODipine  10 mg Oral q morning   calcium acetate  667 mg Oral TID WC   carbamazepine  1,000 mg Oral q AM   Chlorhexidine Gluconate Cloth  6 each Topical Daily   cloNIDine  0.2 mg Transdermal Weekly   doxazosin  4 mg Oral QHS   epoetin (EPOGEN/PROCRIT)  injection  10,000 Units Intravenous Q M,W,F-HD   ferrous sulfate  325 mg Oral Q breakfast   fluPHENAZine  2.5 mg Oral q AM   heparin  5,000 Units Subcutaneous Q8H   hydrALAZINE  100 mg Oral TID   irbesartan  300 mg Oral Daily   isosorbide mononitrate  60 mg Oral BID   magic mouthwash  10 mL Oral QID   minoxidil  10 mg Oral Daily   mometasone-formoterol  2 puff Inhalation BID   multivitamin  1 tablet Oral QHS   pantoprazole  40 mg Oral Daily   polyethylene glycol  17 g Oral BID   trihexyphenidyl  2 mg Oral BID WC   albuterol, alteplase, anticoagulant sodium citrate, diphenhydrAMINE, haloperidol lactate, heparin, hydrALAZINE, lidocaine (PF), lidocaine-prilocaine, ondansetron **OR** ondansetron (ZOFRAN) IV, mouth rinse, pentafluoroprop-tetrafluoroeth, polyethylene glycol, senna-docusate  Assessment/ Plan:  [name] is a 61 y.o.  male with past medical history including hypertension, anemia, schizophrenia and end-stage renal disease on hemodialysis.  Patient has presented to the ED with altered mental status.  Patient has been admitted for Altered mental status [R41.82] Hypertensive urgency [I16.0] Severe hypertension [I10] Altered mental status, unspecified altered mental status type [  R41.82]  [facility]/MWF/Lt AVF  End-stage renal disease on hemodialysis.  Will maintain outpatient schedule if possible.   Will receive dialysis later today, UF goal 1 L as tolerated.  Next treatment scheduled for Monday.  Renal navigator continues to monitor discharge plan to determine outpatient needs.     2. Anemia of chronic kidney disease Lab Results  Component Value Date   HGB 7.8 (L) 01/02/2022    Mircera prescribed outpatient. EPO 10,000 units IV with dialysis   3. Secondary Hyperparathyroidism:  Lab Results  Component Value Date   CALCIUM 9.3 01/02/2022   PHOS 2.3 (L) 01/02/2022   Calcium remains within desired goal however phosphorus remains decreased but improving.   Continue calcium acetate with meals for now, due to appetite improving.  4.  Hypertension with chronic kidney disease.  Home regimen includes amlodipine, clonidine, hydralazine, isosorbide, and losartan.    Currently on amlodipine 10 mg daily, clonidine patch 0.2 mg per 24 hours, Cardura 4 mg daily at bedtime, hydralazine 100 mg 3 times per day, Avapro 300 mg daily, isosorbide 60 mg twice a day, minoxidil 10 mg daily.  Blood pressure 123/65.  All oral medications held due to scheduled dialysis.     LOS: [place] 8/4/20232:17 PM

## 2022-01-02 NOTE — Progress Notes (Signed)
Post HD RN assessment 

## 2022-01-02 NOTE — Progress Notes (Signed)
Post HD vitals 

## 2022-01-02 NOTE — Progress Notes (Signed)
PT Cancellation Note  Patient Details Name: Todd Brown MRN: [id number] DOB: 17-Nov-1960   Cancelled Treatment:    Reason Eval/Treat Not Completed: Patient at procedure or test/unavailable (Treatment attempted- pt eating his lunch upon arrival, then on second attempt, pt off unit for HD. Will resume therapy later date/time.)  3:10 PM, 01/02/22 Etta Grandchild, PT, DPT Physical Therapist - [facility]  [phone] ([place])    [place] 01/02/2022, 3:10 PM

## 2022-01-02 NOTE — Progress Notes (Signed)
OT Cancellation Note  Patient Details Name: Todd Brown MRN: [id number] DOB: [birth year]   Cancelled Treatment:    Reason Eval/Treat Not Completed: Patient at procedure or test/ unavailable. Pt out of the room for dialysis. Will re-attempt at later date/time as pt is available.   Ardeth Perfect., MPH, MS, OTR/L ascom [phone] 01/02/22, 1:03 PM

## 2022-01-03 DIAGNOSIS — E440 Moderate protein-calorie malnutrition: Secondary | ICD-10-CM

## 2022-01-03 DIAGNOSIS — G9341 Metabolic encephalopathy: Secondary | ICD-10-CM | POA: Diagnosis not present

## 2022-01-03 DIAGNOSIS — I10 Essential (primary) hypertension: Secondary | ICD-10-CM | POA: Diagnosis not present

## 2022-01-03 DIAGNOSIS — N186 End stage renal disease: Secondary | ICD-10-CM | POA: Diagnosis not present

## 2022-01-03 MED ORDER — HYDRALAZINE HCL 50 MG PO TABS
25.0000 mg | ORAL_TABLET | Freq: Three times a day (TID) | ORAL | Status: DC
  Administered 2022-01-03 – 2022-01-04 (×5): 25 mg via ORAL
  Filled 2022-01-03 (×5): qty 1

## 2022-01-03 NOTE — Progress Notes (Signed)
[facility]  ROUNDING NOTE   Subjective:   Todd Brown is a 61 year old male with past medical history including hypertension, anemia, schizophrenia and end-stage renal disease on hemodialysis.  Patient has presented to the ED with altered mental status.  Patient has been admitted for Altered mental status [R41.82] Hypertensive urgency [I16.0] Severe hypertension [I10] Altered mental status, unspecified altered mental status type [R41.82]  Patient is known to our practice and receives outpatient dialysis treatments at [facility] on a MWF schedule.  Update Patient seen resting in bed. Once aroused was very talkative. Patient is requesting breakfast.  Remains on room air No lower extremity edema  Dialysis was terminated 20 min early yesterday due to the patient pulling at his lines.   Objective:  Vital signs in last 24 hours:  Temp:  [97.6 F (36.4 C)-98 F (36.7 C)] 97.6 F (36.4 C) (08/05 0503) Pulse Rate:  [61-74] 61 (08/05 0503) Resp:  [10-20] 16 (08/05 0503) BP: (78-134)/(48-91) 134/91 (08/05 0503) SpO2:  [95 %-100 %] 100 % (08/05 0503) Weight:  [81.4 kg] 81.4 kg (08/04 1244)  Weight change:  Filed Weights   12/31/21 1236 12/31/21 1638 01/02/22 1244  Weight: 78.8 kg 77.5 kg 81.4 kg    Intake/Output: I/O last 3 completed shifts: In: -  Out: 600 [Other:600]   Intake/Output this shift:  No intake/output data recorded.  Physical Exam: General: NAD  Head: Normocephalic, atraumatic. Moist oral mucosal membranes  Eyes: Anicteric  Lungs:  Clear to auscultation, normal effort, room air  Heart: Regular rate and rhythm  Abdomen:  Soft, nontender, non distended  Extremities:  No peripheral edema.  Neurologic: Alert, answer simple questions  Skin: No lesions  Access: Lt AVF     Basic Metabolic Panel: Recent Labs  Lab 12/29/21 0846 12/30/21 0827 01/02/22 0508  NA 133* 133* 135  K 5.8* 4.8 4.4  CL 96* 98 98  CO2 29 27 30   GLUCOSE 73 78  74  BUN 48* 33* 41*  CREATININE 5.81* 4.39* 5.26*  CALCIUM 9.6 9.0 9.3  PHOS 2.1*  --  2.3*     Liver Function Tests: Recent Labs  Lab 12/29/21 0846 01/02/22 0508  ALBUMIN 3.0* 2.4*    No results for input(s): "LIPASE", "AMYLASE" in the last 168 hours. No results for input(s): "AMMONIA" in the last 168 hours.  CBC: Recent Labs  Lab 12/29/21 0846 01/02/22 0508  WBC 3.6* 6.2  HGB 8.2* 7.8*  HCT 24.2* 22.4*  MCV 97.2 96.6  PLT 178 192     Cardiac Enzymes: No results for input(s): "CKTOTAL", "CKMB", "CKMBINDEX", "TROPONINI" in the last 168 hours.  BNP: Invalid input(s): "POCBNP"  CBG: No results for input(s): "GLUCAP" in the last 168 hours.   Microbiology: Results for orders placed or performed during the hospital encounter of 12/15/21  SARS Coronavirus 2 by RT PCR (hospital order, performed in [facility] hospital lab) *cepheid single result test* Anterior Nasal Swab     Status: None   Collection Time: 12/15/21  5:50 PM   Specimen: Anterior Nasal Swab  Result Value Ref Range Status   SARS Coronavirus 2 by RT PCR NEGATIVE NEGATIVE Final    Comment: (NOTE) SARS-CoV-2 target nucleic acids are NOT DETECTED.  The SARS-CoV-2 RNA is generally detectable in upper and lower respiratory specimens during the acute phase of infection. The lowest concentration of SARS-CoV-2 viral copies this assay can detect is 250 copies / mL. A negative result does not preclude SARS-CoV-2 infection and should not  be used as the sole basis for treatment or other patient management decisions.  A negative result may occur with improper specimen collection / handling, submission of specimen other than nasopharyngeal swab, presence of viral mutation(s) within the areas targeted by this assay, and inadequate number of viral copies (<250 copies / mL). A negative result must be combined with clinical observations, patient history, and epidemiological information.  Fact Sheet for Patients:    https://www.patel.info/  Fact Sheet for Healthcare Providers: https://hall.com/  This test is not yet approved or  cleared by the [place] FDA and has been authorized for detection and/or diagnosis of SARS-CoV-2 by FDA under an Emergency Use Authorization (EUA).  This EUA will remain in effect (meaning this test can be used) for the duration of the COVID-19 declaration under Section 564(b)(1) of the Act, 21 U.S.C. section 360bbb-3(b)(1), unless the authorization is terminated or revoked sooner.  Performed at [facility], [address] [place], [place] [address]   MRSA Next Gen by PCR, Nasal     Status: None   Collection Time: 12/15/21  9:03 PM   Specimen: Nasal Mucosa; Nasal Swab  Result Value Ref Range Status   MRSA by PCR Next Gen NOT DETECTED NOT DETECTED Final    Comment: (NOTE) The GeneXpert MRSA Assay (FDA approved for NASAL specimens only), is one component of a comprehensive MRSA colonization surveillance program. It is not intended to diagnose MRSA infection nor to guide or monitor treatment for MRSA infections. Test performance is not FDA approved in patients less than 2 years old. Performed at [facility], [address] [place], [place] [address]     Coagulation Studies: No results for input(s): "LABPROT", "INR" in the last 72 hours.  Urinalysis: No results for input(s): "COLORURINE", "LABSPEC", "PHURINE", "GLUCOSEU", "HGBUR", "BILIRUBINUR", "KETONESUR", "PROTEINUR", "UROBILINOGEN", "NITRITE", "LEUKOCYTESUR" in the last 72 hours.  Invalid input(s): "APPERANCEUR"    Imaging: No results found.   Medications:    anticoagulant sodium citrate      (feeding supplement) PROSource Plus  30 mL Oral TID BM   amLODipine  10 mg Oral q morning   calcium acetate  667 mg Oral TID WC   carbamazepine  1,000 mg Oral q AM   Chlorhexidine Gluconate Cloth  6 each Topical Daily   cloNIDine  0.2 mg  Transdermal Weekly   doxazosin  4 mg Oral QHS   epoetin (EPOGEN/PROCRIT) injection  10,000 Units Intravenous Q M,W,F-HD   ferrous sulfate  325 mg Oral Q breakfast   fluPHENAZine  2.5 mg Oral q AM   heparin  5,000 Units Subcutaneous Q8H   hydrALAZINE  50 mg Oral TID   irbesartan  300 mg Oral Daily   isosorbide mononitrate  60 mg Oral BID   magic mouthwash  10 mL Oral QID   minoxidil  10 mg Oral Daily   mometasone-formoterol  2 puff Inhalation BID   multivitamin  1 tablet Oral QHS   pantoprazole  40 mg Oral Daily   polyethylene glycol  17 g Oral BID   trihexyphenidyl  2 mg Oral BID WC   albuterol, alteplase, anticoagulant sodium citrate, diphenhydrAMINE, haloperidol lactate, heparin, hydrALAZINE, lidocaine (PF), lidocaine-prilocaine, ondansetron **OR** ondansetron (ZOFRAN) IV, mouth rinse, pentafluoroprop-tetrafluoroeth, polyethylene glycol, senna-docusate  Assessment/ Plan:  [name] is a 61 y.o.  male with past medical history including hypertension, anemia, schizophrenia and end-stage renal disease on hemodialysis.  Patient has presented to the ED with altered mental status.  Patient has been  admitted for Altered mental status [R41.82] Hypertensive urgency [I16.0] Severe hypertension [I10] Altered mental status, unspecified altered mental status type [R41.82]  [facility]/MWF/Lt AVF  End-stage renal disease on hemodialysis.  Will maintain outpatient schedule if possible.   Next treatment scheduled for Monday.  Renal navigator continues to monitor discharge plan to determine outpatient needs.     2. Anemia of chronic kidney disease Lab Results  Component Value Date   HGB 7.8 (L) 01/02/2022    Mircera prescribed outpatient. EPO 10,000 units IV with dialysis   3. Secondary Hyperparathyroidism:  Lab Results  Component Value Date   CALCIUM 9.3 01/02/2022   PHOS 2.3 (L) 01/02/2022   Calcium remains within desired goal however phosphorus remains decreased but  improving.  Continue calcium acetate with meals for now, due to appetite improving and phosphorus trending up.   4.  Hypertension with chronic kidney disease.  Home regimen includes amlodipine, clonidine, hydralazine, isosorbide, and losartan.    Currently on amlodipine 10 mg daily, clonidine patch 0.2 mg per 24 hours, Cardura 4 mg daily at bedtime, hydralazine 100 mg 3 times per day, Avapro 300 mg daily, isosorbide 60 mg twice a day, minoxidil 10 mg daily.  Blood pressure 134/91.       LOS: [place] 8/5/20238:21 AM

## 2022-01-03 NOTE — Progress Notes (Signed)
[facility] Progress Note  Patient: Todd Brown    DJS:[id number]  DOA: 12/15/2021    Date of Service: the patient was seen and examined on 01/03/2022  Brief hospital course: 61 year old male with past medical history of hypertension and end-stage renal disease on hemodialysis presented to the emergency room from his group home/skilled nursing facility on 7/17 with confusion.  (Patient's baseline is that he is unable to care for himself and has a legal guardian.)  Patient last received dialysis on Friday, 7/14.  FPatient found to have markedly elevated blood pressure with a systolic in the 885O requiring Cardene drip and placement of patient to the ICU.  Patient with questionable cardiac ischemia as well.  CT scan of head and neurological exam unremarkable.  Patient was admitted to the hospitalist service and started on his oral antihypertensives, nicardipine drip and given IV labetalol and as needed IV hydralazine.  Nephrology consulted and with plans to resume patient's dialysis  Attempts to wean off Cardene have led to increasing high blood pressure, compounded by patient at first refusing to take oral medications.  Had become more physically agitated, wanting to be left alone.  Seen by psychiatry and started on scheduled and as needed Haldol.  Has become more compliant and better mentating and taking medications and blood pressure improving.  Also developed some periorbital swelling bilaterally felt to be secondary to subcutaneous emphysema and started on steroids, and periorbital swelling has since resolved.  Over time, blood pressure became more manageable.  Patient was supposed to be discharged back to his assisted living facility on 7/21 after dialysis.  His baseline is that he is able to sit in a chair for dialysis although some assistance.  And trying to get him out of bed and placed in a wheelchair, took 3 people and still patient unable to sit and stay in chair.  Discharge canceled and plan  is for patient to have PT/OT evaluation and possible skilled nursing prior to returning to assisted living.  PT and OT are recommending rehab.  Patient with very limited options in terms of excepting skilled nursing facilities.  Found to have some oral thrush.  Started on Magic mouthwash and prednisone was discontinued.  Patient did have to return to stepdown on 7/25 with restarting of Cardene drip which was able to be discontinued by 7/26.  At this time, he is still waiting for skilled nursing.  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  Resolved and now back at baseline.  Had some increased somnolence which improved after stopping his scheduled Benadryl and Haldol.  Hypertensive emergency-resolved as of 12/31/2021 Resolved.  See below.  ESRD on dialysis [facility]) Nephrology consulted, full dialysis session following admission and again today to keep him on his Monday Wednesday Friday schedule. -Continue scheduled dialysis  Mental health disorder Unspecified.  Patient is unable to care for himself.  It is unclear if he is ever had a formal diagnosis.  He is on Tegretol and Artane which he should continue.  As needed Haldol for acute behavior agitation as above.  He is for the most part much more calm and cooperative these days over times, he intermittently refused vitals being checked or taking his medications.  Essential hypertension Resumed on his home p.o. amlodipine 10 mg daily, clonidine 0.3 mg p.o. 3 times daily, hydralazine 100 mg p.o. 3 times daily, isosorbide mononitrate 60 mg p.o. twice daily, losartan 100 mg daily.  Pressures at time improved, but then blood pressure starts to spike again.  As above, changing ARB and adding alpha-blocker. -Nephrology added clonidine patches with p.o. clonidine. -Minoxidil was added and titrated up to 10 mg daily. Occasionally having episodes of lower  blood pressure, so as of 8/5, will decrease hydralazine from 50 3 times daily to 25 3 times daily.  Oral thrush-resolved as of 01/02/2022 Improved with Magic mouthwash  Subcutaneous emphysema (HCC)-resolved as of 12/18/2021 On steroids.  Appreciate pulmonary assistance.  Subcutaneous emphysema looks to be resolved.  Changed from Solu-Medrol to p.o. prednisone quick taper.  Physical deconditioning PT OT are recommending SNF.  However, this is similar to a previous hospitalization where TOC was unable to find skilled nursing and over time, patient's strength improved that he was able to be discharged back to his assisted living facility.  Malnutrition of moderate degree Nutrition Status: Nutrition Problem: Moderate Malnutrition Etiology: chronic illness (ESRD on HD) Signs/Symptoms: mild fat depletion, mild muscle depletion, moderate muscle depletion Interventions: Refer to RD note for recommendations, Nepro shake, MVI, Prostat       Body mass index is 24.34 kg/m.  Nutrition Problem: Moderate Malnutrition Etiology: chronic illness (ESRD on HD)     Consultants: Nephrology Critical care Psychiatry  Procedures: Hemodialysis   Antimicrobials: None  Code Status: Full code   Subjective: Patient doing okay.  No complaints, enjoying lunch  Objective: Blood pressures have remained stable Vitals:   01/02/22 1955 01/03/22 0503  BP: 98/62 (!) 134/91  Pulse: 63 61  Resp: 16 16  Temp: 97.6 F (36.4 C) 97.6 F (36.4 C)  SpO2: 100% 100%    Intake/Output Summary (Last 24 hours) at 01/03/2022 1457 Last data filed at 01/03/2022 0900 Gross per 24 hour  Intake 240 ml  Output 600 ml  Net -360 ml     Filed Weights   12/31/21 1236 12/31/21 1638 01/02/22 1244  Weight: 78.8 kg 77.5 kg 81.4 kg   Body mass index is 24.34 kg/m.  Exam:  General: Remains quiet, little more interactive today HEENT: Normocephalic and atraumatic, mucous membranes moist Cardiovascular: Regular rate  and rhythm, S1-S2, 2 out of 6 systolic ejection murmur Respiratory: Clear to auscultation bilaterally Abdomen: Soft, nontender, nondistended, hypoactive bowel sounds Musculoskeletal: No clubbing or cyanosis or edema Skin: No skin breaks, tears or lesions Psychiatry: Appropriate, no evidence of psychoses, flattened affect Neurology: Difficult to assess secondary to compliance  Data Reviewed: No labs today  Disposition:  Status is: Inpatient Remains inpatient appropriate because: Looking for skilled nursing facility options    Anticipated discharge date: 8/8  Family Communication: No family, message left for guardian DVT Prophylaxis: heparin injection 5,000 Units Start: 12/15/21 1645 Place TED hose Start: 12/15/21 1629    Author: Annita Brod ,MD 01/03/2022 2:57 PM  To reach On-call, see care teams to locate the attending and reach out via [email or web address]. Between 7PM-7AM, please contact night-coverage If you still have difficulty reaching the attending provider, please page the [facility] (Director on Call) for [facility] on amion for assistance.

## 2022-01-04 DIAGNOSIS — I10 Essential (primary) hypertension: Secondary | ICD-10-CM | POA: Diagnosis not present

## 2022-01-04 DIAGNOSIS — G9341 Metabolic encephalopathy: Secondary | ICD-10-CM | POA: Diagnosis not present

## 2022-01-04 DIAGNOSIS — E440 Moderate protein-calorie malnutrition: Secondary | ICD-10-CM | POA: Diagnosis not present

## 2022-01-04 DIAGNOSIS — N186 End stage renal disease: Secondary | ICD-10-CM | POA: Diagnosis not present

## 2022-01-04 MED ORDER — MINOXIDIL 2.5 MG PO TABS
5.0000 mg | ORAL_TABLET | Freq: Every day | ORAL | Status: DC
  Administered 2022-01-05 – 2022-01-14 (×8): 5 mg via ORAL
  Filled 2022-01-04 (×10): qty 2

## 2022-01-04 NOTE — Progress Notes (Signed)
Physical Therapy Treatment Patient Details Name: Todd Brown MRN: [id number] DOB: [birth year] Today's Date: 01/04/2022   History of Present Illness 61 year old male with past medical history of hypertension and end-stage renal disease on hemodialysis presented to the emergency room from his group home/skilled nursing facility on 7/17 with confusion.  (Patient's baseline is that he is unable to care for himself and has a legal guardian.)  Patient last received dialysis on Friday, 7/14.  From being in the emergency room on 7/17, unable to get dialysis yesterday.  Patient found to have markedly elevated blood pressure with a systolic in the 510C.  Patient with questionable cardiac ischemia as well    PT Comments    Pt up in chair with OT prior to arrival.  He reports being tired and need mod verbal and tactile cues for BLE seated AROM x 10.  Frequently stops to tell me he is tired but does continue when cued.  He did not want to try standing/transfers today due to fatigue.  He did seem to do a bit better with mobility per OT notes today.   Recommendations for follow up therapy are one component of a multi-disciplinary discharge planning process, led by the attending physician.  Recommendations may be updated based on patient status, additional functional criteria and insurance authorization.  Follow Up Recommendations  Skilled nursing-short term rehab (<3 hours/day)     Assistance Recommended at Discharge Frequent or constant Supervision/Assistance  Patient can return home with the following Two people to help with walking and/or transfers;Two people to help with bathing/dressing/bathroom;Direct supervision/assist for medications management;Direct supervision/assist for financial management;Assistance with cooking/housework;Assist for transportation;Help with stairs or ramp for entrance   Equipment Recommendations       Recommendations for Other Services       Precautions / Restrictions  Precautions Precautions: Fall Precaution Comments: vision impaired Restrictions Weight Bearing Restrictions: No     Mobility  Bed Mobility               General bed mobility comments: in chair upon arrival    Transfers                        Ambulation/Gait                   Stairs             Wheelchair Mobility    Modified Rankin (Stroke Patients Only)       Balance                                            Cognition Arousal/Alertness: Awake/alert Behavior During Therapy: WFL for tasks assessed/performed Overall Cognitive Status: No family/caregiver present to determine baseline cognitive functioning                                          Exercises      General Comments General comments (skin integrity, edema, etc.): spo2 down to 85 % on 2L via [place] with transfer, quickly recovered >90% on 2 L via [place] ;      Pertinent Vitals/Pain Pain Assessment Pain Assessment: No/denies pain    Home Living  Prior Function            PT Goals (current goals can now be found in the care plan section) Progress towards PT goals: Progressing toward goals    Frequency    Min 2X/week      PT Plan Current plan remains appropriate    Co-evaluation              AM-PAC PT "6 Clicks" Mobility   Outcome Measure  Help needed turning from your back to your side while in a flat bed without using bedrails?: A Lot Help needed moving from lying on your back to sitting on the side of a flat bed without using bedrails?: A Lot Help needed moving to and from a bed to a chair (including a wheelchair)?: Total Help needed standing up from a chair using your arms (e.g., wheelchair or bedside chair)?: Total Help needed to walk in hospital room?: Total Help needed climbing 3-5 steps with a railing? : Total 6 Click Score: 8    End of Session Equipment Utilized During  Treatment: Oxygen Activity Tolerance: Patient limited by fatigue Patient left: in chair;with call bell/phone within reach;with chair alarm set Nurse Communication: Mobility status PT Visit Diagnosis: Other abnormalities of gait and mobility (R26.89);Muscle weakness (generalized) (M62.81);Unsteadiness on feet (R26.81)     Time: 1210-1223 PT Time Calculation (min) (ACUTE ONLY): 13 min  Charges:  $Therapeutic Exercise: 8-22 mins                   Chesley Noon, PTA 01/04/22, 12:30 PM

## 2022-01-04 NOTE — Progress Notes (Signed)
Occupational Therapy Treatment Patient Details Name: Todd Brown MRN: [id number] DOB: Nov 04, 1960 Today's Date: 01/04/2022   History of present illness 61 year old male with past medical history of hypertension and end-stage renal disease on hemodialysis presented to the emergency room from his group home/skilled nursing facility on 7/17 with confusion.  (Patient's baseline is that he is unable to care for himself and has a legal guardian.)  Patient last received dialysis on Friday, 7/14.  From being in the emergency room on 7/17, unable to get dialysis yesterday.  Patient found to have markedly elevated blood pressure with a systolic in the 588F.  Patient with questionable cardiac ischemia as well   OT comments  Chart reviewed, pt greeted in bed, alert, oriented to self, not oriented to date or situation; Pt oriented to place after provided 3 options. Tx session targeted improving functional mobility and activity tolerance to facilitate return to PLOF. Progress is noted in bed mobility, requiring MOD A, STS with MIN A, step pivot transfer to beside chair with MIN A. Pt able to stand for approx 30 seconds at chair side. Pt then performs grooming tasks with SET UP. Step by step vcs required throughout mobility and ADL tasks due to vision and cognitive impairment. Pt is making progress towards goals, continues to perform below PLOF. Discharge recommendation remains appropriate.    Recommendations for follow up therapy are one component of a multi-disciplinary discharge planning process, led by the attending physician.  Recommendations may be updated based on patient status, additional functional criteria and insurance authorization.    Follow Up Recommendations  Skilled nursing-short term rehab (<3 hours/day)    Assistance Recommended at Discharge Frequent or constant Supervision/Assistance  Patient can return home with the following  A lot of help with bathing/dressing/bathroom;A lot of help with  walking and/or transfers   Equipment Recommendations  Other (comment) (per next venue of care)    Recommendations for Other Services      Precautions / Restrictions Precautions Precautions: Fall Precaution Comments: vision impaired Restrictions Weight Bearing Restrictions: No       Mobility Bed Mobility Overal bed mobility: Needs Assistance Bed Mobility: Supine to Sit     Supine to sit: Mod assist, HOB elevated          Transfers Overall transfer level: Needs assistance Equipment used: Rolling walker (2 wheels) Transfers: Sit to/from Stand Sit to Stand: Min assist                 Balance Overall balance assessment: Needs assistance Sitting-balance support: Feet supported Sitting balance-[name]: Fair   Postural control: Right lateral lean Standing balance support: Bilateral upper extremity supported Standing balance-[name]: Poor                             ADL either performed or assessed with clinical judgement   ADL Overall ADL's : Needs assistance/impaired     Grooming: Wash/dry face;Set up;Sitting;Cueing for sequencing                   Toilet Transfer: Minimal assistance Toilet Transfer Details (indicate cue type and reason): step pivot transfer to bedside chair                Extremity/Trunk Assessment              Vision       Perception     Praxis      Cognition Arousal/Alertness: Awake/alert Behavior  During Therapy: WFL for tasks assessed/performed Overall Cognitive Status: No family/caregiver present to determine baseline cognitive functioning Area of Impairment: Orientation, Attention, Following commands, Safety/judgement, Awareness, Problem solving                 Orientation Level: Disoriented to, Situation (oriented to hospital given 3 choices) Current Attention Level: Sustained   Following Commands: Follows one step commands with increased time Safety/Judgement: Decreased  awareness of safety, Decreased awareness of deficits Awareness: Intellectual Problem Solving: Slow processing, Decreased initiation, Difficulty sequencing, Requires verbal cues, Requires tactile cues          Exercises      Shoulder Instructions       General Comments spo2 down to 85 % on 2L via [place] with transfer, quickly recovered >90% on 2 L via [place];    Pertinent Vitals/ Pain       Pain Assessment Pain Assessment: No/denies pain  Home Living                                          Prior Functioning/Environment              Frequency  Min 2X/week        Progress Toward Goals  OT Goals(current goals can now be found in the care plan section)  Progress towards OT goals: Progressing toward goals     Plan Discharge plan remains appropriate;Frequency remains appropriate    Co-evaluation                 AM-PAC OT "6 Clicks" Daily Activity     Outcome Measure   Help from another person eating meals?: A Little Help from another person taking care of personal grooming?: A Little Help from another person toileting, which includes using toliet, bedpan, or urinal?: A Lot Help from another person bathing (including washing, rinsing, drying)?: A Lot Help from another person to put on and taking off regular upper body clothing?: A Little Help from another person to put on and taking off regular lower body clothing?: A Lot 6 Click Score: 15    End of Session Equipment Utilized During Treatment: Rolling walker (2 wheels);Oxygen  OT Visit Diagnosis: Unsteadiness on feet (R26.81);Muscle weakness (generalized) (M62.81)   Activity Tolerance Patient tolerated treatment well   Patient Left in chair;with call bell/phone within reach;with chair alarm set   Nurse Communication Mobility status        Time: 2233-6122 OT Time Calculation (min): 15 min  Charges: OT General Charges $OT Visit: 1 Visit OT Treatments $Therapeutic Activity: 8-22  mins  Shanon Payor, OTD OTR/L  01/04/22, 12:23 PM

## 2022-01-04 NOTE — Progress Notes (Signed)
[facility]  ROUNDING NOTE   Subjective:   Todd Brown is a 61 year old male with past medical history including hypertension, anemia, schizophrenia and end-stage renal disease on hemodialysis.  Patient has presented to the ED with altered mental status.  Patient has been admitted for Altered mental status [R41.82] Hypertensive urgency [I16.0] Severe hypertension [I10] Altered mental status, unspecified altered mental status type [R41.82]  Patient is known to our practice and receives outpatient dialysis treatments at [facility] on a MWF schedule.  Patient was eating breakfast this morning.  Remains on room air No complaints    Objective:  Vital signs in last 24 hours:  Temp:  [97.9 F (36.6 C)-98.4 F (36.9 C)] 97.9 F (36.6 C) (08/06 0756) Pulse Rate:  [74-84] 81 (08/06 0756) Resp:  [17-19] 18 (08/06 0756) BP: (125-154)/(71-83) 132/78 (08/06 0756) SpO2:  [90 %-98 %] 90 % (08/06 0756)  Weight change:  Filed Weights   12/31/21 1236 12/31/21 1638 01/02/22 1244  Weight: 78.8 kg 77.5 kg 81.4 kg    Intake/Output: I/O last 3 completed shifts: In: 600 [P.O.:600] Out: -    Intake/Output this shift:  No intake/output data recorded.  Physical Exam: General: NAD  Head: Normocephalic, atraumatic. Moist oral mucosal membranes  Eyes: Anicteric  Lungs:  Clear to auscultation, normal effort, room air  Heart: Regular rate and rhythm  Abdomen:  Soft, nontender, non distended  Extremities:  1+ peripheral edema.  Neurologic: Alert, answer simple questions  Skin: No lesions  Access: Lt AVF     Basic Metabolic Panel: Recent Labs  Lab 12/29/21 0846 12/30/21 0827 01/02/22 0508  NA 133* 133* 135  K 5.8* 4.8 4.4  CL 96* 98 98  CO2 29 27 30   GLUCOSE 73 78 74  BUN 48* 33* 41*  CREATININE 5.81* 4.39* 5.26*  CALCIUM 9.6 9.0 9.3  PHOS 2.1*  --  2.3*     Liver Function Tests: Recent Labs  Lab 12/29/21 0846 01/02/22 0508  ALBUMIN 3.0* 2.4*     No results for input(s): "LIPASE", "AMYLASE" in the last 168 hours. No results for input(s): "AMMONIA" in the last 168 hours.  CBC: Recent Labs  Lab 12/29/21 0846 01/02/22 0508  WBC 3.6* 6.2  HGB 8.2* 7.8*  HCT 24.2* 22.4*  MCV 97.2 96.6  PLT 178 192     Cardiac Enzymes: No results for input(s): "CKTOTAL", "CKMB", "CKMBINDEX", "TROPONINI" in the last 168 hours.  BNP: Invalid input(s): "POCBNP"  CBG: No results for input(s): "GLUCAP" in the last 168 hours.   Microbiology: Results for orders placed or performed during the hospital encounter of 12/15/21  SARS Coronavirus 2 by RT PCR (hospital order, performed in [facility] hospital lab) *cepheid single result test* Anterior Nasal Swab     Status: None   Collection Time: 12/15/21  5:50 PM   Specimen: Anterior Nasal Swab  Result Value Ref Range Status   SARS Coronavirus 2 by RT PCR NEGATIVE NEGATIVE Final    Comment: (NOTE) SARS-CoV-2 target nucleic acids are NOT DETECTED.  The SARS-CoV-2 RNA is generally detectable in upper and lower respiratory specimens during the acute phase of infection. The lowest concentration of SARS-CoV-2 viral copies this assay can detect is 250 copies / mL. A negative result does not preclude SARS-CoV-2 infection and should not be used as the sole basis for treatment or other patient management decisions.  A negative result may occur with improper specimen collection / handling, submission of specimen other than nasopharyngeal swab, presence of  viral mutation(s) within the areas targeted by this assay, and inadequate number of viral copies (<250 copies / mL). A negative result must be combined with clinical observations, patient history, and epidemiological information.  Fact Sheet for Patients:   https://www.patel.info/  Fact Sheet for Healthcare Providers: https://hall.com/  This test is not yet approved or  cleared by the [place] FDA  and has been authorized for detection and/or diagnosis of SARS-CoV-2 by FDA under an Emergency Use Authorization (EUA).  This EUA will remain in effect (meaning this test can be used) for the duration of the COVID-19 declaration under Section 564(b)(1) of the Act, 21 U.S.C. section 360bbb-3(b)(1), unless the authorization is terminated or revoked sooner.  Performed at [facility], [address] [place], [place] [address]   MRSA Next Gen by PCR, Nasal     Status: None   Collection Time: 12/15/21  9:03 PM   Specimen: Nasal Mucosa; Nasal Swab  Result Value Ref Range Status   MRSA by PCR Next Gen NOT DETECTED NOT DETECTED Final    Comment: (NOTE) The GeneXpert MRSA Assay (FDA approved for NASAL specimens only), is one component of a comprehensive MRSA colonization surveillance program. It is not intended to diagnose MRSA infection nor to guide or monitor treatment for MRSA infections. Test performance is not FDA approved in patients less than 61 years old. Performed at [facility], [address] [place], [place] [address]     Coagulation Studies: No results for input(s): "LABPROT", "INR" in the last 72 hours.  Urinalysis: No results for input(s): "COLORURINE", "LABSPEC", "PHURINE", "GLUCOSEU", "HGBUR", "BILIRUBINUR", "KETONESUR", "PROTEINUR", "UROBILINOGEN", "NITRITE", "LEUKOCYTESUR" in the last 72 hours.  Invalid input(s): "APPERANCEUR"    Imaging: No results found.   Medications:    anticoagulant sodium citrate      (feeding supplement) PROSource Plus  30 mL Oral TID BM   amLODipine  10 mg Oral q morning   calcium acetate  667 mg Oral TID WC   carbamazepine  1,000 mg Oral q AM   Chlorhexidine Gluconate Cloth  6 each Topical Daily   cloNIDine  0.2 mg Transdermal Weekly   doxazosin  4 mg Oral QHS   epoetin (EPOGEN/PROCRIT) injection  10,000 Units Intravenous Q M,W,F-HD   ferrous sulfate  325 mg Oral Q breakfast   fluPHENAZine  2.5 mg Oral q  AM   heparin  5,000 Units Subcutaneous Q8H   hydrALAZINE  25 mg Oral TID   irbesartan  300 mg Oral Daily   isosorbide mononitrate  60 mg Oral BID   magic mouthwash  10 mL Oral QID   minoxidil  10 mg Oral Daily   mometasone-formoterol  2 puff Inhalation BID   multivitamin  1 tablet Oral QHS   pantoprazole  40 mg Oral Daily   polyethylene glycol  17 g Oral BID   trihexyphenidyl  2 mg Oral BID WC   albuterol, alteplase, anticoagulant sodium citrate, diphenhydrAMINE, haloperidol lactate, heparin, hydrALAZINE, lidocaine (PF), lidocaine-prilocaine, ondansetron **OR** ondansetron (ZOFRAN) IV, mouth rinse, pentafluoroprop-tetrafluoroeth, polyethylene glycol, senna-docusate  Assessment/ Plan:  [name] is a 61 y.o.  male with past medical history including hypertension, anemia, schizophrenia and end-stage renal disease on hemodialysis.  Patient has presented to the ED with altered mental status.  Patient has been admitted for Altered mental status [R41.82] Hypertensive urgency [I16.0] Severe hypertension [I10] Altered mental status, unspecified altered mental status type [R41.82]  [facility]/MWF/Lt AVF  End-stage renal disease on hemodialysis.  Will  maintain outpatient schedule if possible.   Plan to dialyze tomorrow No indication for HD today   Renal navigator continues to monitor discharge plan to determine outpatient needs.     2. Anemia of chronic kidney disease Lab Results  Component Value Date   HGB 7.8 (L) 01/02/2022    Mircera prescribed outpatient. EPO 10,000 units IV with dialysis   3. Secondary Hyperparathyroidism:  Lab Results  Component Value Date   CALCIUM 9.3 01/02/2022   PHOS 2.3 (L) 01/02/2022   Calcium remains within desired goal however phosphorus remains decreased but improving.  Continue calcium acetate with meals for now, due to appetite improving and phosphorus trending up. Will obtain repeat phosphorus tomorrow with HD.   4.  Hypertension  with chronic kidney disease.  Home regimen includes amlodipine, clonidine, hydralazine, isosorbide, and losartan.    Currently on amlodipine 10 mg daily, clonidine patch 0.2 mg per 24 hours, Cardura 4 mg daily at bedtime, hydralazine 100 mg 3 times per day, Avapro 300 mg daily, isosorbide 60 mg twice a day, minoxidil 10 mg daily.  Blood pressure controlled this morning: 132/78     LOS: [place] 8/6/202310:19 AM

## 2022-01-04 NOTE — TOC Progression Note (Signed)
Transition of Care [facility]) - Progression Note    Patient Details  Name: Todd Brown MRN: [id number] Date of Birth: [birth year]  Transition of Care [facility]) CM/SW Contact  Izola Price, RN Phone Number: 01/04/2022, 10:56 AM  Clinical Narrative:  8/6: No bed offers as yet. PT/OT has not been able to see patient consistently since 8/3 due to nursing care and procedures. Goal was to try to get patient stronger for possible change to [facility] is no bed offers for STR/SNF. Requested PT/OT to see patient today if possible via unit RN and Provider and OT via a secure chat. Simmie Davies RN CM    Expected Discharge Plan: Skilled Nursing Facility Barriers to Discharge: Continued Medical Work up  Expected Discharge Plan and Services Expected Discharge Plan: [place] arrangements for the past 2 months: [place] Expected Discharge Date: 12/19/21                                     Social Determinants of Health (SDOH) Interventions    Readmission Risk Interventions     No data to display

## 2022-01-04 NOTE — Progress Notes (Signed)
[facility] Progress Note  Patient: Todd Brown    GUY:[id number]  DOA: 12/15/2021    Date of Service: the patient was seen and examined on 01/04/2022  Brief hospital course: 61 year old male with past medical history of hypertension and end-stage renal disease on hemodialysis presented to the emergency room from his group home/skilled nursing facility on 7/17 with confusion.  (Patient's baseline is that he is unable to care for himself and has a legal guardian.)  Patient last received dialysis on Friday, 7/14.  FPatient found to have markedly elevated blood pressure with a systolic in the 563O requiring Cardene drip and placement of patient to the ICU.  Patient with questionable cardiac ischemia as well.  CT scan of head and neurological exam unremarkable.  Patient was admitted to the hospitalist service and started on his oral antihypertensives, nicardipine drip and given IV labetalol and as needed IV hydralazine.  Nephrology consulted and with plans to resume patient's dialysis  Attempts to wean off Cardene have led to increasing high blood pressure, compounded by patient at first refusing to take oral medications.  Had become more physically agitated, wanting to be left alone.  Seen by psychiatry and started on scheduled and as needed Haldol.  Has become more compliant and better mentating and taking medications and blood pressure improving.  Also developed some periorbital swelling bilaterally felt to be secondary to subcutaneous emphysema and started on steroids, and periorbital swelling has since resolved.  Over time, blood pressure became more manageable.  Patient was supposed to be discharged back to his assisted living facility on 7/21 after dialysis.  His baseline is that he is able to sit in a chair for dialysis although some assistance.  And trying to get him out of bed and placed in a wheelchair, took 3 people and still patient unable to sit and stay in chair.  Discharge canceled and plan  is for patient to have PT/OT evaluation and possible skilled nursing prior to returning to assisted living.  PT and OT are recommending rehab.  Patient with very limited options in terms of excepting skilled nursing facilities.  Found to have some oral thrush.  Started on Magic mouthwash and prednisone was discontinued.  Patient did have to return to stepdown on 7/25 with restarting of Cardene drip which was able to be discontinued by 7/26.  At this time, he is still waiting for skilled nursing.  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  Resolved and now back at baseline.  Had some increased somnolence which improved after stopping his scheduled Benadryl and Haldol.  Hypertensive emergency-resolved as of 12/31/2021 Resolved.  See below.  ESRD on dialysis [facility]) Nephrology consulted, full dialysis session following admission and again today to keep him on his Monday Wednesday Friday schedule. -Continue scheduled dialysis  Mental health disorder Unspecified.  Patient is unable to care for himself.  It is unclear if he is ever had a formal diagnosis.  He is on Tegretol and Artane which he should continue.  As needed Haldol for acute behavior agitation as above.  He is for the most part much more calm and cooperative these days over times, he intermittently refused vitals being checked or taking his medications.  Essential hypertension Resumed on his home p.o. amlodipine 10 mg daily, clonidine 0.3 mg p.o. 3 times daily, hydralazine 100 mg p.o. 3 times daily, isosorbide mononitrate 60 mg p.o. twice daily, losartan 100 mg daily.  Pressures at time improved, but then blood pressure starts to spike again.  As above, changing ARB and adding alpha-blocker. -Nephrology added clonidine patches with p.o. clonidine. -Minoxidil was added and titrated up to 10 mg daily. Occasionally having episodes of lower  blood pressure, so as of 8/5, will decrease hydralazine from 50 3 times daily to 25 3 times daily.  Oral thrush-resolved as of 01/02/2022 Improved with Magic mouthwash  Subcutaneous emphysema (HCC)-resolved as of 12/18/2021 On steroids.  Appreciate pulmonary assistance.  Subcutaneous emphysema looks to be resolved.  Changed from Solu-Medrol to p.o. prednisone quick taper.  Physical deconditioning PT OT are recommending SNF.  However, this is similar to a previous hospitalization where TOC was unable to find skilled nursing and over time, patient's strength improved that he was able to be discharged back to his assisted living facility.  Malnutrition of moderate degree Nutrition Status: Nutrition Problem: Moderate Malnutrition Etiology: chronic illness (ESRD on HD) Signs/Symptoms: mild fat depletion, mild muscle depletion, moderate muscle depletion Interventions: Refer to RD note for recommendations, Nepro shake, MVI, Prostat       Body mass index is 24.34 kg/m.  Nutrition Problem: Moderate Malnutrition Etiology: chronic illness (ESRD on HD)     Consultants: Nephrology Critical care Psychiatry  Procedures: Hemodialysis   Antimicrobials: None  Code Status: Full code   Subjective: Patient doing okay.  No complaints, enjoying lunch  Objective: Blood pressures have remained stable Vitals:   01/04/22 0756 01/04/22 1212  BP: 132/78   Pulse: 81   Resp: 18   Temp: 97.9 F (36.6 C)   SpO2: 90% 91%    Intake/Output Summary (Last 24 hours) at 01/04/2022 1354 Last data filed at 01/04/2022 0030 Gross per 24 hour  Intake 360 ml  Output --  Net 360 ml      Filed Weights   12/31/21 1236 12/31/21 1638 01/02/22 1244  Weight: 78.8 kg 77.5 kg 81.4 kg   Body mass index is 24.34 kg/m.  Exam:  General: Remains quiet, little more interactive today HEENT: Normocephalic and atraumatic, mucous membranes moist Cardiovascular: Regular rate and rhythm, S1-S2, 2 out of 6  systolic ejection murmur Respiratory: Clear to auscultation bilaterally Abdomen: Soft, nontender, nondistended, hypoactive bowel sounds Musculoskeletal: No clubbing or cyanosis or edema Skin: No skin breaks, tears or lesions Psychiatry: Appropriate, no evidence of psychoses, flattened affect Neurology: Difficult to assess secondary to compliance  Data Reviewed: No labs today  Disposition:  Status is: Inpatient Remains inpatient appropriate because: Looking for skilled nursing facility options    Anticipated discharge date: 8/8  Family Communication: No family, message left for guardian DVT Prophylaxis: heparin injection 5,000 Units Start: 12/15/21 1645 Place TED hose Start: 12/15/21 1629    Author: Annita Brod ,MD 01/04/2022 1:54 PM  To reach On-call, see care teams to locate the attending and reach out via [email or web address]. Between 7PM-7AM, please contact night-coverage If you still have difficulty reaching the attending provider, please page the [facility] (Director on Call) for [facility] on amion for assistance.

## 2022-01-05 DIAGNOSIS — I10 Essential (primary) hypertension: Secondary | ICD-10-CM | POA: Diagnosis not present

## 2022-01-05 DIAGNOSIS — E440 Moderate protein-calorie malnutrition: Secondary | ICD-10-CM | POA: Diagnosis not present

## 2022-01-05 DIAGNOSIS — G9341 Metabolic encephalopathy: Secondary | ICD-10-CM | POA: Diagnosis not present

## 2022-01-05 DIAGNOSIS — N186 End stage renal disease: Secondary | ICD-10-CM | POA: Diagnosis not present

## 2022-01-05 LAB — CBC WITH DIFFERENTIAL/PLATELET
Abs Immature Granulocytes: 0.02 10*3/uL (ref 0.00–0.07)
Basophils Absolute: 0 10*3/uL (ref 0.0–0.1)
Basophils Relative: 0 %
Eosinophils Absolute: 0.1 10*3/uL (ref 0.0–0.5)
Eosinophils Relative: 1 %
HCT: 23.9 % — ABNORMAL LOW (ref 39.0–52.0)
Hemoglobin: 8.2 g/dL — ABNORMAL LOW (ref 13.0–17.0)
Immature Granulocytes: 0 %
Lymphocytes Relative: 9 %
Lymphs Abs: 0.5 10*3/uL — ABNORMAL LOW (ref 0.7–4.0)
MCH: 33.7 pg (ref 26.0–34.0)
MCHC: 34.3 g/dL (ref 30.0–36.0)
MCV: 98.4 fL (ref 80.0–100.0)
Monocytes Absolute: 0.7 10*3/uL (ref 0.1–1.0)
Monocytes Relative: 14 %
Neutro Abs: 3.9 10*3/uL (ref 1.7–7.7)
Neutrophils Relative %: 76 %
Platelets: 215 10*3/uL (ref 150–400)
RBC: 2.43 MIL/uL — ABNORMAL LOW (ref 4.22–5.81)
RDW: 14.4 % (ref 11.5–15.5)
WBC: 5.1 10*3/uL (ref 4.0–10.5)
nRBC: 0 % (ref 0.0–0.2)

## 2022-01-05 LAB — RENAL FUNCTION PANEL
Albumin: 3.5 g/dL (ref 3.5–5.0)
Anion gap: 14 (ref 5–15)
BUN: 70 mg/dL — ABNORMAL HIGH (ref 8–23)
CO2: 26 mmol/L (ref 22–32)
Calcium: 9.8 mg/dL (ref 8.9–10.3)
Chloride: 93 mmol/L — ABNORMAL LOW (ref 98–111)
Creatinine, Ser: 6.03 mg/dL — ABNORMAL HIGH (ref 0.61–1.24)
GFR, Estimated: 10 mL/min — ABNORMAL LOW (ref >60)
Glucose, Bld: 107 mg/dL — ABNORMAL HIGH (ref 70–99)
Phosphorus: 2 mg/dL — ABNORMAL LOW (ref 2.5–4.6)
Potassium: 5.3 mmol/L — ABNORMAL HIGH (ref 3.5–5.1)
Sodium: 133 mmol/L — ABNORMAL LOW (ref 135–145)

## 2022-01-05 MED ORDER — EPOETIN ALFA 10000 UNIT/ML IJ SOLN
INTRAMUSCULAR | Status: AC
  Filled 2022-01-05: qty 1

## 2022-01-05 MED ORDER — HYDRALAZINE HCL 10 MG PO TABS
10.0000 mg | ORAL_TABLET | Freq: Three times a day (TID) | ORAL | Status: DC
  Administered 2022-01-05 – 2022-01-06 (×2): 10 mg via ORAL
  Filled 2022-01-05 (×3): qty 1

## 2022-01-05 NOTE — Progress Notes (Signed)
Occupational Therapy Treatment Patient Details Name: Todd Brown MRN: [id number] DOB: 05-Mar-1961 Today's Date: 01/05/2022   History of present illness 61 year old male with past medical history of hypertension and end-stage renal disease on hemodialysis presented to the emergency room from his group home/skilled nursing facility on 7/17 with confusion.  (Patient's baseline is that he is unable to care for himself and has a legal guardian.)  Patient last received dialysis on Friday, 7/14.  From being in the emergency room on 7/17, unable to get dialysis yesterday.  Patient found to have markedly elevated blood pressure with a systolic in the 962E.  Patient with questionable cardiac ischemia as well   OT comments  Pt seen for OT tx and co-tx with PT. Pt received in recliner. Pt requires CGA-MIN A +2 for safety for STS and SPT with tactile cues for hand placement to help push up from the recliner. Able to take some steps forward, backwards, and side to side with some tactile cues and verbal cues for direction. Completed grooming and self feeding seated EOB with setup and supervision with intermittent VC for safety. Pt left seated EOB with bed alarm on and remainder of drinks in front of him per RN. Pt progressing towards goals. Continues to benefit from skilled OT services.    Recommendations for follow up therapy are one component of a multi-disciplinary discharge planning process, led by the attending physician.  Recommendations may be updated based on patient status, additional functional criteria and insurance authorization.    Follow Up Recommendations  Skilled nursing-short term rehab (<3 hours/day)    Assistance Recommended at Discharge Frequent or constant Supervision/Assistance  Patient can return home with the following  A lot of help with bathing/dressing/bathroom;A lot of help with walking and/or transfers;Assistance with feeding;Assist for transportation;Direct supervision/assist for  medications management;Direct supervision/assist for financial management;Assistance with cooking/housework;Help with stairs or ramp for entrance   Equipment Recommendations  Other (comment) (defer to next venue)    Recommendations for Other Services      Precautions / Restrictions Precautions Precautions: Fall Precaution Comments: vision impaired Restrictions Weight Bearing Restrictions: No       Mobility Bed Mobility               General bed mobility comments: in chair upon arrival    Transfers Overall transfer level: Needs assistance Equipment used: Rolling walker (2 wheels) Transfers: Sit to/from Stand, Bed to chair/wheelchair/BSC Sit to Stand: Min assist, +2 safety/equipment Stand pivot transfers: Min assist, Min guard, +2 safety/equipment               Balance Overall balance assessment: Needs assistance Sitting-balance support: Feet supported Sitting balance-[name]: Fair   Postural control: Right lateral lean Standing balance support: Bilateral upper extremity supported Standing balance-[name]: Poor                             ADL either performed or assessed with clinical judgement   ADL Overall ADL's : Needs assistance/impaired Eating/Feeding: Supervision/ safety;Set up Eating/Feeding Details (indicate cue type and reason): VC for safety, minimizing aspiration risk, setup for items, opening containers, and to hand specific items Grooming: Sitting;Set up;Wash/dry face Grooming Details (indicate cue type and reason): VC to initiate                                    Extremity/Trunk Assessment  Vision Baseline Vision/History: 2 Legally blind Patient Visual Report: No change from baseline     Perception     Praxis      Cognition Arousal/Alertness: Awake/alert Behavior During Therapy: WFL for tasks assessed/performed, Flat affect Overall Cognitive Status: No family/caregiver present to  determine baseline cognitive functioning                                 General Comments: slow processing, decreased initiation, VC required        Exercises      Shoulder Instructions       General Comments      Pertinent Vitals/ Pain       Pain Assessment Pain Assessment: No/denies pain  Home Living                                          Prior Functioning/Environment              Frequency  Min 2X/week        Progress Toward Goals  OT Goals(current goals can now be found in the care plan section)  Progress towards OT goals: Progressing toward goals  Acute Rehab OT Goals OT Goal Formulation: With patient  Plan Discharge plan remains appropriate;Frequency remains appropriate    Co-evaluation    PT/OT/SLP Co-Evaluation/Treatment: Yes Reason for Co-Treatment: For patient/therapist safety;To address functional/ADL transfers PT goals addressed during session: Mobility/safety with mobility;Balance OT goals addressed during session: ADL's and self-care      AM-PAC OT "6 Clicks" Daily Activity     Outcome Measure   Help from another person eating meals?: A Little Help from another person taking care of personal grooming?: A Little Help from another person toileting, which includes using toliet, bedpan, or urinal?: A Lot Help from another person bathing (including washing, rinsing, drying)?: A Lot Help from another person to put on and taking off regular upper body clothing?: A Little Help from another person to put on and taking off regular lower body clothing?: A Lot 6 Click Score: 15    End of Session Equipment Utilized During Treatment: Rolling walker (2 wheels);Oxygen  OT Visit Diagnosis: Unsteadiness on feet (R26.81);Muscle weakness (generalized) (M62.81)   Activity Tolerance Patient tolerated treatment well   Patient Left in bed;with call bell/phone within reach;with bed alarm set;Other (comment) (seated EOB  for rest of breakfast, RN Aware)   Nurse Communication Mobility status        Time: 5038-8828 OT Time Calculation (min): 39 min  Charges: OT General Charges $OT Visit: 1 Visit OT Treatments $Self Care/Home Management : 23-37 mins  Ardeth Perfect., MPH, MS, OTR/L ascom [phone] 01/05/22, 1:36 PM

## 2022-01-05 NOTE — Progress Notes (Signed)
[facility] Progress Note  Patient: Todd Brown    IFO:[id number]  DOA: 12/15/2021    Date of Service: the patient was seen and examined on 01/05/2022  Brief hospital course: 61 year old male with past medical history of hypertension and end-stage renal disease on hemodialysis presented to the emergency room from his group home/skilled nursing facility on 7/17 with confusion.  (Patient's baseline is that he is unable to care for himself and has a legal guardian.)  Patient last received dialysis on Friday, 7/14.  FPatient found to have markedly elevated blood pressure with a systolic in the 676H requiring Cardene drip and placement of patient to the ICU.  Patient with questionable cardiac ischemia as well.  CT scan of head and neurological exam unremarkable.  Patient was admitted to the hospitalist service and started on his oral antihypertensives, nicardipine drip and given IV labetalol and as needed IV hydralazine.  Nephrology consulted and with plans to resume patient's dialysis  Attempts to wean off Cardene have led to increasing high blood pressure, compounded by patient at first refusing to take oral medications.  Had become more physically agitated, wanting to be left alone.  Seen by psychiatry and started on scheduled and as needed Haldol.  Has become more compliant and better mentating and taking medications and blood pressure improving.  Also developed some periorbital swelling bilaterally felt to be secondary to subcutaneous emphysema and started on steroids, and periorbital swelling has since resolved.  Over time, blood pressure became more manageable.  Patient was supposed to be discharged back to his assisted living facility on 7/21 after dialysis.  His baseline is that he is able to sit in a chair for dialysis although some assistance.  And trying to get him out of bed and placed in a wheelchair, took 3 people and still patient unable to sit and stay in chair.  Discharge canceled and plan  is for patient to have PT/OT evaluation and possible skilled nursing prior to returning to assisted living.  PT and OT are recommending rehab.  Patient with very limited options in terms of excepting skilled nursing facilities.  Found to have some oral thrush.  Started on Magic mouthwash and prednisone was discontinued.  Patient did have to return to stepdown on 7/25 with restarting of Cardene drip which was able to be discontinued by 7/26.  At this time, he is still waiting for skilled nursing.  Blood pressure has become much more manageable and additional antihypertensives that were added to patient's medication regimen are starting to be scaled back.  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  Resolved and now back at baseline.  Had some increased somnolence which improved after stopping his scheduled Benadryl and Haldol.  Hypertensive emergency-resolved as of 12/31/2021 Resolved.  See below.  ESRD on dialysis [facility]) Nephrology consulted, full dialysis session following admission and again today to keep him on his Monday Wednesday Friday schedule. -Continue scheduled dialysis  Mental health disorder Unspecified.  Patient is unable to care for himself.  It is unclear if he is ever had a formal diagnosis.  He is on Tegretol and Artane which he should continue.  As needed Haldol for acute behavior agitation as above.  He is for the most part much more calm and cooperative these days over times, he intermittently refused vitals being checked or taking his medications.  Essential hypertension Resumed on his home p.o. amlodipine 10 mg daily, clonidine  0.3 mg p.o. 3 times daily, hydralazine 100 mg p.o. 3 times daily, isosorbide mononitrate 60 mg p.o. twice daily, losartan 100 mg daily.  Pressures at time improved, but then blood pressure starts to spike again.  As above, changing ARB and adding  alpha-blocker. -Nephrology added clonidine patches with p.o. clonidine. -Minoxidil was then added and titrated up to 10 mg daily.  Over the past week, occasionally having episodes of lower blood pressure without any blood pressure spikes, so we will decrease hydralazine further down to 10 mg 3 times daily and have decreased his minoxidil from 10 mg to 5 mg  Oral thrush-resolved as of 01/02/2022 Improved with Magic mouthwash  Subcutaneous emphysema (HCC)-resolved as of 12/18/2021 On steroids.  Appreciate pulmonary assistance.  Subcutaneous emphysema looks to be resolved.  Changed from Solu-Medrol to p.o. prednisone quick taper.  Physical deconditioning PT OT are recommending SNF.  However, this is similar to a previous hospitalization where TOC was unable to find skilled nursing and over time, patient's strength improved that he was able to be discharged back to his assisted living facility.  Malnutrition of moderate degree Nutrition Status: Nutrition Problem: Moderate Malnutrition Etiology: chronic illness (ESRD on HD) Signs/Symptoms: mild fat depletion, mild muscle depletion, moderate muscle depletion Interventions: Refer to RD note for recommendations, Nepro shake, MVI, Prostat       Body mass index is 25.21 kg/m.  Nutrition Problem: Moderate Malnutrition Etiology: chronic illness (ESRD on HD)     Consultants: Nephrology Critical care Psychiatry  Procedures: Hemodialysis   Antimicrobials: None  Code Status: Full code   Subjective: Patient with no complaints  Objective: Blood pressures have remained stable Vitals:   01/05/22 1429 01/05/22 1510  BP: 119/71 117/71  Pulse: 73 71  Resp: 13 17  Temp:  98.1 F (36.7 C)  SpO2: 94% 100%    Intake/Output Summary (Last 24 hours) at 01/05/2022 1635 Last data filed at 01/05/2022 1424 Gross per 24 hour  Intake --  Output 900 ml  Net -900 ml      Filed Weights   01/02/22 1244 01/05/22 1052 01/05/22 1424  Weight:  81.4 kg 85.7 kg 84.3 kg   Body mass index is 25.21 kg/m.  Exam:  General: Remains quiet, little more interactive today HEENT: Normocephalic and atraumatic, mucous membranes moist Cardiovascular: Regular rate and rhythm, S1-S2, 2 out of 6 systolic ejection murmur Respiratory: Clear to auscultation bilaterally Abdomen: Soft, nontender, nondistended, hypoactive bowel sounds Musculoskeletal: No clubbing or cyanosis or edema Skin: No skin breaks, tears or lesions Psychiatry: Appropriate, no evidence of psychoses, flattened affect Neurology: Difficult to assess secondary to compliance  Data Reviewed: No labs today  Disposition:  Status is: Inpatient Remains inpatient appropriate because: Looking for skilled nursing facility options    Anticipated discharge date: 67/10  Family Communication: No family, message left for guardian DVT Prophylaxis: heparin injection 5,000 Units Start: 12/15/21 1645 Place TED hose Start: 12/15/21 1629    Author: Annita Brod ,MD 01/05/2022 4:35 PM  To reach On-call, see care teams to locate the attending and reach out via [email or web address]. Between 7PM-7AM, please contact night-coverage If you still have difficulty reaching the attending provider, please page the [facility] (Director on Call) for [facility] on amion for assistance.

## 2022-01-05 NOTE — Progress Notes (Signed)
Pre HD RN assessment 

## 2022-01-05 NOTE — Progress Notes (Signed)
Post HD RN assessment 

## 2022-01-05 NOTE — Progress Notes (Signed)
Pt completed 3.40 HD treatment w/ no complications. Restlessness last 9 minutes. Gwyneth Revels, NP present and approved early rinseback for patient. 918ml fluid removed and 80.4L BVP. Pt alert to self, vss, report to primary RN. Post weight 84.3 kg in bed.

## 2022-01-05 NOTE — Progress Notes (Signed)
[facility]  ROUNDING NOTE   Subjective:   Todd Brown is a 61 year old male with past medical history including hypertension, anemia, schizophrenia and end-stage renal disease on hemodialysis.  Patient has presented to the ED with altered mental status.  Patient has been admitted for Altered mental status [R41.82] Hypertensive urgency [I16.0] Severe hypertension [I10] Altered mental status, unspecified altered mental status type [R41.82]  Patient is known to our practice and receives outpatient dialysis treatments at [facility] on a MWF schedule.  Update: Patient seen and evaluated during dialysis   HEMODIALYSIS FLOWSHEET:  Blood Flow Rate (mL/min): 400 mL/min Arterial Pressure (mmHg): -120 mmHg Venous Pressure (mmHg): 250 mmHg TMP (mmHg): 8 mmHg Ultrafiltration Rate (mL/min): 454 mL/min Dialysate Flow Rate (mL/min): 300 ml/min Dialysis Fluid Bolus: Normal Saline Bolus Amount (mL): 300 mL  Resting comfortably  Objective:  Vital signs in last 24 hours:  Temp:  [97.5 F (36.4 C)-98.6 F (37 C)] 97.8 F (36.6 C) (08/07 1052) Pulse Rate:  [66-80] 68 (08/07 1330) Resp:  [12-25] 15 (08/07 1330) BP: (83-138)/(47-81) 104/64 (08/07 1330) SpO2:  [94 %-100 %] 99 % (08/07 1330) Weight:  [85.7 kg] 85.7 kg (08/07 1052)  Weight change:  Filed Weights   12/31/21 1638 01/02/22 1244 01/05/22 1052  Weight: 77.5 kg 81.4 kg 85.7 kg    Intake/Output: I/O last 3 completed shifts: In: 360 [P.O.:360] Out: -    Intake/Output this shift:  No intake/output data recorded.  Physical Exam: General: NAD  Head: Normocephalic, atraumatic. Moist oral mucosal membranes  Eyes: Anicteric  Lungs:  Clear to auscultation, normal effort, room air  Heart: Regular rate and rhythm  Abdomen:  Soft, nontender, non distended  Extremities:  trace peripheral edema.  Neurologic: Alert, answer simple questions  Skin: No lesions  Access: Lt AVF     Basic Metabolic  Panel: Recent Labs  Lab 12/30/21 0827 01/02/22 0508 01/05/22 0959  NA 133* 135 133*  K 4.8 4.4 5.3*  CL 98 98 93*  CO2 27 30 26   GLUCOSE 78 74 107*  BUN 33* 41* 70*  CREATININE 4.39* 5.26* 6.03*  CALCIUM 9.0 9.3 9.8  PHOS  --  2.3* 2.0*     Liver Function Tests: Recent Labs  Lab 01/02/22 0508 01/05/22 0959  ALBUMIN 2.4* 3.5    No results for input(s): "LIPASE", "AMYLASE" in the last 168 hours. No results for input(s): "AMMONIA" in the last 168 hours.  CBC: Recent Labs  Lab 01/02/22 0508 01/05/22 0959  WBC 6.2 5.1  NEUTROABS  --  3.9  HGB 7.8* 8.2*  HCT 22.4* 23.9*  MCV 96.6 98.4  PLT 192 215     Cardiac Enzymes: No results for input(s): "CKTOTAL", "CKMB", "CKMBINDEX", "TROPONINI" in the last 168 hours.  BNP: Invalid input(s): "POCBNP"  CBG: No results for input(s): "GLUCAP" in the last 168 hours.   Microbiology: Results for orders placed or performed during the hospital encounter of 12/15/21  SARS Coronavirus 2 by RT PCR (hospital order, performed in [facility] hospital lab) *cepheid single result test* Anterior Nasal Swab     Status: None   Collection Time: 12/15/21  5:50 PM   Specimen: Anterior Nasal Swab  Result Value Ref Range Status   SARS Coronavirus 2 by RT PCR NEGATIVE NEGATIVE Final    Comment: (NOTE) SARS-CoV-2 target nucleic acids are NOT DETECTED.  The SARS-CoV-2 RNA is generally detectable in upper and lower respiratory specimens during the acute phase of infection. The lowest concentration of SARS-CoV-2 viral  copies this assay can detect is 250 copies / mL. A negative result does not preclude SARS-CoV-2 infection and should not be used as the sole basis for treatment or other patient management decisions.  A negative result may occur with improper specimen collection / handling, submission of specimen other than nasopharyngeal swab, presence of viral mutation(s) within the areas targeted by this assay, and inadequate number of  viral copies (<250 copies / mL). A negative result must be combined with clinical observations, patient history, and epidemiological information.  Fact Sheet for Patients:   https://www.patel.info/  Fact Sheet for Healthcare Providers: https://hall.com/  This test is not yet approved or  cleared by the [place] FDA and has been authorized for detection and/or diagnosis of SARS-CoV-2 by FDA under an Emergency Use Authorization (EUA).  This EUA will remain in effect (meaning this test can be used) for the duration of the COVID-19 declaration under Section 564(b)(1) of the Act, 21 U.S.C. section 360bbb-3(b)(1), unless the authorization is terminated or revoked sooner.  Performed at [facility], [address] [place], [place] [address]   MRSA Next Gen by PCR, Nasal     Status: None   Collection Time: 12/15/21  9:03 PM   Specimen: Nasal Mucosa; Nasal Swab  Result Value Ref Range Status   MRSA by PCR Next Gen NOT DETECTED NOT DETECTED Final    Comment: (NOTE) The GeneXpert MRSA Assay (FDA approved for NASAL specimens only), is one component of a comprehensive MRSA colonization surveillance program. It is not intended to diagnose MRSA infection nor to guide or monitor treatment for MRSA infections. Test performance is not FDA approved in patients less than 12 years old. Performed at [facility], [address] [place], [place] [address]     Coagulation Studies: No results for input(s): "LABPROT", "INR" in the last 72 hours.  Urinalysis: No results for input(s): "COLORURINE", "LABSPEC", "PHURINE", "GLUCOSEU", "HGBUR", "BILIRUBINUR", "KETONESUR", "PROTEINUR", "UROBILINOGEN", "NITRITE", "LEUKOCYTESUR" in the last 72 hours.  Invalid input(s): "APPERANCEUR"    Imaging: No results found.   Medications:    anticoagulant sodium citrate      (feeding supplement) PROSource Plus  30 mL Oral TID BM    amLODipine  10 mg Oral q morning   calcium acetate  667 mg Oral TID WC   carbamazepine  1,000 mg Oral q AM   Chlorhexidine Gluconate Cloth  6 each Topical Daily   cloNIDine  0.2 mg Transdermal Weekly   doxazosin  4 mg Oral QHS   epoetin alfa       epoetin (EPOGEN/PROCRIT) injection  10,000 Units Intravenous Q M,W,F-HD   ferrous sulfate  325 mg Oral Q breakfast   fluPHENAZine  2.5 mg Oral q AM   heparin  5,000 Units Subcutaneous Q8H   hydrALAZINE  25 mg Oral TID   irbesartan  300 mg Oral Daily   isosorbide mononitrate  60 mg Oral BID   magic mouthwash  10 mL Oral QID   minoxidil  5 mg Oral Daily   mometasone-formoterol  2 puff Inhalation BID   multivitamin  1 tablet Oral QHS   pantoprazole  40 mg Oral Daily   polyethylene glycol  17 g Oral BID   trihexyphenidyl  2 mg Oral BID WC   albuterol, alteplase, anticoagulant sodium citrate, diphenhydrAMINE, epoetin alfa, haloperidol lactate, heparin, hydrALAZINE, lidocaine (PF), lidocaine-prilocaine, ondansetron **OR** ondansetron (ZOFRAN) IV, mouth rinse, pentafluoroprop-tetrafluoroeth, polyethylene glycol, senna-docusate  Assessment/ Plan:  Todd Brown is a 61 y.o.  male with past medical history including hypertension, anemia, schizophrenia and end-stage renal disease on hemodialysis.  Patient has presented to the ED with altered mental status.  Patient has been admitted for Altered mental status [R41.82] Hypertensive urgency [I16.0] Severe hypertension [I10] Altered mental status, unspecified altered mental status type [R41.82]  [facility]/MWF/Lt AVF  End-stage renal disease on hemodialysis.  Will maintain outpatient schedule if possible.   Receiving scheduled dialysis today, UF goal 1L as tolerated. Patient did become very confused and disoriented during treatment. Sat up in bed and asking about his brother, requesting someone scratch his back and more nonaudible request. Patient was redirected and calmed by staff.    Renal navigator continues to monitor discharge plan to determine outpatient needs.     2. Anemia of chronic kidney disease Lab Results  Component Value Date   HGB 8.2 (L) 01/05/2022    Mircera prescribed outpatient. EPO with dialysis   3. Secondary Hyperparathyroidism:  Lab Results  Component Value Date   CALCIUM 9.8 01/05/2022   PHOS 2.0 (L) 01/05/2022   Phosphorus decreased again. Will hold calcium acetate with meals and monitor.  4.  Hypertension with chronic kidney disease.  Home regimen includes amlodipine, clonidine, hydralazine, isosorbide, and losartan.    Currently on amlodipine 10 mg daily, clonidine patch 0.2 mg per 24 hours, Cardura 4 mg daily at bedtime, hydralazine 100 mg 3 times per day, Avapro 300 mg daily, isosorbide 60 mg twice a day, minoxidil 10 mg daily.  Blood pressure 105/73 with dialysis.      LOS: [place] 8/7/20232:07 PM

## 2022-01-05 NOTE — Progress Notes (Signed)
Physical Therapy Treatment Patient Details Name: Todd Brown MRN: [id number] DOB: November 17, 1960 Today's Date: 01/05/2022   History of Present Illness 61 year old male with past medical history of hypertension and end-stage renal disease on hemodialysis presented to the emergency room from his group home/skilled nursing facility on 7/17 with confusion.  (Patient's baseline is that he is unable to care for himself and has a legal guardian.)  Patient last received dialysis on Friday, 7/14.  From being in the emergency room on 7/17, unable to get dialysis yesterday.  Patient found to have markedly elevated blood pressure with a systolic in the 448J.  Patient with questionable cardiac ischemia as well    PT Comments    Co-tx with OT for improved outcomes.  9:32-9:40 co-tx with OT remaining in room to continue interventions  Pt in chair.  Stood with min a x 1 and tactile cues for hand placements.  He does stand with encouragement and min a x 2 for pt and staff safety.  He takes several steps forward and back with rw and min a x 2 and eventually turns to sit on bed to rest.  Stands and transfers back to chair with a few extra forward and back steps.  RN in room for meds and stated he was put on morning dialysis schedule so he was assisted back to EOB where he remained up with OT for feeding as tray had arrived during session.  Pt does have overall improved mobility today and standing tolerance.  He often stops walking and will stand with walker and 1 foot placed in front of him where he appears to be resting.  Some general unsteadiness and +2 is used for pt as staff safety but does not need +2 assist for transfers or balance.  Overall is progressing towards goals.  If pt does return to group home a walker, commode and a wheelchair would be beneficial to pt and staff.  [name] SNF is recommended, per [facility] notes he does not have bed offers at this time and would need proper supports at group home.   Patient  suffers from general weakness from end stage renal disease which impairs his/her ability to perform daily activities like toileting, feeding, dressing, grooming, bathing in the home. A cane, walker, crutch will not resolve the patient's issue with performing activities of daily living. A lightweight wheelchair and cushion is required/recommended and will allow patient to safely perform daily activities.   Patient can safely propel the wheelchair in the home or has a caregiver who can provide assistance.     Recommendations for follow up therapy are one component of a multi-disciplinary discharge planning process, led by the attending physician.  Recommendations may be updated based on patient status, additional functional criteria and insurance authorization.  Follow Up Recommendations  Skilled nursing-short term rehab (<3 hours/day)     Assistance Recommended at Discharge Frequent or constant Supervision/Assistance  Patient can return home with the following Two people to help with walking and/or transfers;Two people to help with bathing/dressing/bathroom;Direct supervision/assist for medications management;Direct supervision/assist for financial management;Assistance with cooking/housework;Assist for transportation;Help with stairs or ramp for entrance   Equipment Recommendations    RW, Commode, Wheelchair, cushion   Recommendations for Other Services       Precautions / Restrictions Precautions Precautions: Fall Precaution Comments: vision impaired Restrictions Weight Bearing Restrictions: No     Mobility  Bed Mobility               General bed mobility comments:  in chair upon arrival    Transfers Overall transfer level: Needs assistance Equipment used: Rolling walker (2 wheels)   Sit to Stand: Min assist, +2 safety/equipment Stand pivot transfers: Min assist, Min guard, +2 safety/equipment              Ambulation/Gait Ambulation/Gait assistance: Min assist,  +2 safety/equipment Gait Distance (Feet): 5 Feet Assistive device: Rolling walker (2 wheels)   Gait velocity: decreased         Stairs             Wheelchair Mobility    Modified Rankin (Stroke Patients Only)       Balance Overall balance assessment: Needs assistance Sitting-balance support: Feet supported Sitting balance-[name]: Fair   Postural control: Right lateral lean Standing balance support: Bilateral upper extremity supported Standing balance-[name]: Poor                              Cognition Arousal/Alertness: Awake/alert Behavior During Therapy: WFL for tasks assessed/performed, Flat affect Overall Cognitive Status: No family/caregiver present to determine baseline cognitive functioning                                 General Comments: patient able to follow single step commands with increased time but does self direct care at times.        Exercises      General Comments        Pertinent Vitals/Pain Pain Assessment Pain Assessment: No/denies pain    Home Living                          Prior Function            PT Goals (current goals can now be found in the care plan section) Progress towards PT goals: Progressing toward goals    Frequency    Min 2X/week      PT Plan Current plan remains appropriate    Co-evaluation PT/OT/SLP Co-Evaluation/Treatment: Yes Reason for Co-Treatment: For patient/therapist safety PT goals addressed during session: Mobility/safety with mobility;Balance OT goals addressed during session: ADL's and self-care      AM-PAC PT "6 Clicks" Mobility   Outcome Measure  Help needed turning from your back to your side while in a flat bed without using bedrails?: A Little Help needed moving from lying on your back to sitting on the side of a flat bed without using bedrails?: A Little Help needed moving to and from a bed to a chair (including a wheelchair)?:  A Little Help needed standing up from a chair using your arms (e.g., wheelchair or bedside chair)?: A Little Help needed to walk in hospital room?: A Little Help needed climbing 3-5 steps with a railing? : Total 6 Click Score: 16    End of Session Equipment Utilized During Treatment: Oxygen Activity Tolerance: Patient limited by fatigue Patient left: in chair;with call bell/phone within reach;with chair alarm set Nurse Communication: Mobility status PT Visit Diagnosis: Other abnormalities of gait and mobility (R26.89);Muscle weakness (generalized) (M62.81);Unsteadiness on feet (R26.81)     Time: 1540-0867 PT Time Calculation (min) (ACUTE ONLY): 8 min  Charges:                      Chesley Noon, PTA 01/05/22, 10:23 AM

## 2022-01-05 NOTE — Progress Notes (Signed)
Post HD vitals 

## 2022-01-06 DIAGNOSIS — I10 Essential (primary) hypertension: Secondary | ICD-10-CM | POA: Diagnosis not present

## 2022-01-06 DIAGNOSIS — E440 Moderate protein-calorie malnutrition: Secondary | ICD-10-CM | POA: Diagnosis not present

## 2022-01-06 DIAGNOSIS — N186 End stage renal disease: Secondary | ICD-10-CM | POA: Diagnosis not present

## 2022-01-06 DIAGNOSIS — G9341 Metabolic encephalopathy: Secondary | ICD-10-CM | POA: Diagnosis not present

## 2022-01-06 MED ORDER — HYDRALAZINE HCL 10 MG PO TABS
5.0000 mg | ORAL_TABLET | Freq: Three times a day (TID) | ORAL | Status: DC
  Administered 2022-01-06 – 2022-01-15 (×24): 5 mg via ORAL
  Filled 2022-01-06 (×26): qty 1

## 2022-01-06 MED ORDER — K PHOS MONO-SOD PHOS DI & MONO 155-852-130 MG PO TABS
500.0000 mg | ORAL_TABLET | ORAL | Status: AC
  Administered 2022-01-06 (×2): 500 mg via ORAL
  Filled 2022-01-06 (×2): qty 2

## 2022-01-06 NOTE — Progress Notes (Signed)
Nutrition Follow-up  DOCUMENTATION CODES:   Non-severe (moderate) malnutrition in context of chronic illness  INTERVENTION:   -Continue 30 ml Prosource Plus TID, each supplement provides 100 kcals and 15 grams protein -Continue renal MVI daily -Continue double protein portions with meals -Continue assistance with meals  NUTRITION DIAGNOSIS:   Moderate Malnutrition related to chronic illness (ESRD on HD) as evidenced by mild fat depletion, mild muscle depletion, moderate muscle depletion.  Ongoing  GOAL:   Patient will meet greater than or equal to 90% of their needs  Progressing   MONITOR:   PO intake, Supplement acceptance, Labs, Weight trends, Skin, I & O's  REASON FOR ASSESSMENT:   Malnutrition Screening Tool    ASSESSMENT:   61 y/o male with h/o HTN, ESRD on HD, schizophrenia and lives in a group home who is admitted with thrush and AMS.  7/26- s/p BSE- dysphagia 3 diet with thin liquids 8/4- oral thrush resolved  Reviewed I/O's: -900 ml x 24 hours and +856 ml since 12/23/21   Pt sitting up in bed, eating breakfast at time of visit. RD assisted pt with handing bowls over to him so he could self-feed. Pt requiring feeding assistance with meals. Pt reports great appetite; noted meal completions 50-100%. He shares he is eating all of his meals. Discussed importance of good meal and supplement intake to promote healing.   Pt awaiting SNF placement for discharge.   Medications reviewed and include ferrous sulfate, phosphorus, and miralax.   Labs reviewed: Na: 133, K: 5.3, CBGS: 103 (inpatient orders for glycemic control are none).    Diet Order:   Diet Order             DIET DYS 3 Room service appropriate? Yes; Fluid consistency: Thin  Diet effective now           DIET DYS 3                   EDUCATION NEEDS:   No education needs have been identified at this time  Skin:  Skin Assessment: Reviewed RN Assessment  Last BM:  12/26/21  Height:   Ht  Readings from Last 1 Encounters:  12/22/21 6' (1.829 m)    Weight:   Wt Readings from Last 1 Encounters:  01/05/22 84.3 kg    Ideal Body Weight:  80.9 kg  BMI:  Body mass index is 25.21 kg/m.  Estimated Nutritional Needs:   Kcal:  2100-2300  Protein:  105-120 grams  Fluid:  1000 ml +UOP    Loistine Chance, RD, LDN, [place] Registered Dietitian II Certified Diabetes Care and Education Specialist Please refer to [facility] for RD and/or RD on-call/weekend/after hours pager

## 2022-01-06 NOTE — Progress Notes (Addendum)
Physical Therapy Treatment Patient Details Name: Todd Brown MRN: [id number] DOB: December 17, 1960 Today's Date: 01/06/2022   History of Present Illness 61 year old male with past medical history of hypertension and end-stage renal disease on hemodialysis presented to the emergency room from his group home/skilled nursing facility on 7/17 with confusion.  (Patient's baseline is that he is unable to care for himself and has a legal guardian.)  Patient last received dialysis on Friday, 7/14.  From being in the emergency room on 7/17, unable to get dialysis yesterday.  Patient found to have markedly elevated blood pressure with a systolic in the 751W.  Patient with questionable cardiac ischemia as well    PT Comments    Patient agreeable to PT. He was in the chair on arrival to room. The patient is showing improvement with functional independence today. He required Min A for the first stand and Min guard for the second stand. Weight shifting performed in standing with rolling walker for support with standing tolerance of 30 seconds. Recommend to continue PT to maximize independence and decrease caregiver burden. If patient continues to show improvements with independence with mobility, he could likely return back to previous living arrangement if he has intermittent assistance.    Recommendations for follow up therapy are one component of a multi-disciplinary discharge planning process, led by the attending physician.  Recommendations may be updated based on patient status, additional functional criteria and insurance authorization.  Follow Up Recommendations  Skilled nursing-short term rehab (<3 hours/day) Can patient physically be transported by private vehicle: No   Assistance Recommended at Discharge Frequent or constant Supervision/Assistance  Patient can return home with the following A little help with walking and/or transfers;A little help with bathing/dressing/bathroom;Help with stairs or ramp  for entrance;Assist for transportation;Direct supervision/assist for medications management   Equipment Recommendations  Rolling walker (2 wheels);BSC/3in1;Wheelchair cushion (measurements PT);Wheelchair (measurements PT)    Recommendations for Other Services       Precautions / Restrictions Precautions Precautions: Fall Restrictions Weight Bearing Restrictions: No     Mobility  Bed Mobility               General bed mobility comments: not observed as patient sitting up in chair on arrival to room and post session    Transfers Overall transfer level: Needs assistance Equipment used: Rolling walker (2 wheels) Transfers: Sit to/from Stand Sit to Stand: Min assist, Min guard           General transfer comment: Min A for first stand and Min gaurd for second standing bout. verbal cues for hand placement and technique. patient is showing consistent improvement with increased independence with transfers    Ambulation/Gait             Pre-gait activities: weight shifting to each side multiple times with RW for support. no loss of balance and no knee buckling noted. standing tolerance limited to around 30 seconds due to fatigue General Gait Details: not attempted due to fatigue with standing.   Stairs             Wheelchair Mobility    Modified Rankin (Stroke Patients Only)       Balance   Sitting-balance support: Feet supported Sitting balance-[name]: Fair Sitting balance - Comments: no loss of balance in sitting position   Standing balance support: Bilateral upper extremity supported Standing balance-[name]: Fair Standing balance comment: no loss of balance with rolling walker for support in standing  Cognition Arousal/Alertness: Awake/alert Behavior During Therapy: WFL for tasks assessed/performed, Flat affect Overall Cognitive Status: No family/caregiver present to determine baseline cognitive  functioning                                 General Comments: patient is awake, sitting up in chair, able to follow single step commands with increased time        Exercises      General Comments        Pertinent Vitals/Pain Pain Assessment Pain Assessment: No/denies pain    Home Living                          Prior Function            PT Goals (current goals can now be found in the care plan section) Acute Rehab PT Goals Patient Stated Goal: unable to state PT Goal Formulation: Patient unable to participate in goal setting Time For Goal Achievement: 01/08/22 Potential to Achieve Goals: Fair Progress towards PT goals: Progressing toward goals    Frequency    Min 2X/week      PT Plan Current plan remains appropriate    Co-evaluation              AM-PAC PT "6 Clicks" Mobility   Outcome Measure  Help needed turning from your back to your side while in a flat bed without using bedrails?: A Little Help needed moving from lying on your back to sitting on the side of a flat bed without using bedrails?: A Little Help needed moving to and from a bed to a chair (including a wheelchair)?: A Little Help needed standing up from a chair using your arms (e.g., wheelchair or bedside chair)?: A Little Help needed to walk in hospital room?: A Little Help needed climbing 3-5 steps with a railing? : Total 6 Click Score: 16    End of Session Equipment Utilized During Treatment: Oxygen Activity Tolerance: Patient tolerated treatment well Patient left: in chair;with call bell/phone within reach;with chair alarm set Nurse Communication: Mobility status PT Visit Diagnosis: Other abnormalities of gait and mobility (R26.89);Muscle weakness (generalized) (M62.81);Unsteadiness on feet (R26.81)     Time: 0037-0488 PT Time Calculation (min) (ACUTE ONLY): 20 min  Charges:  $Therapeutic Activity: 8-22 mins                     [name], PT,  MPT    [name] 01/06/2022, 2:18 PM

## 2022-01-06 NOTE — TOC Progression Note (Signed)
Transition of Care [facility]) - Progression Note    Patient Details  Name: Torrell Krutz MRN: [id number] Date of Birth: 10/20/1960  Transition of Care [facility]) CM/SW Lane, RN Phone Number: 01/06/2022, 10:25 AM  Clinical Narrative:   No SNF bed offers.  Continue to see PT/OT for strengthening.  Hospitalist aware of plan.  TOC will continue to monitor.    Expected Discharge Plan: [place] Barriers to Discharge: Continued Medical Work up  Expected Discharge Plan and Services Expected Discharge Plan: [place] arrangements for the past 2 months: [place] Expected Discharge Date: 12/19/21                                     Social Determinants of Health (SDOH) Interventions    Readmission Risk Interventions     No data to display

## 2022-01-06 NOTE — Progress Notes (Signed)
[facility]  ROUNDING NOTE   Subjective:   Todd Brown is a 61 year old male with past medical history including hypertension, anemia, schizophrenia and end-stage renal disease on hemodialysis.  Patient has presented to the ED with altered mental status.  Patient has been admitted for Altered mental status [R41.82] Hypertensive urgency [I16.0] Severe hypertension [I10] Altered mental status, unspecified altered mental status type [R41.82]  Patient is known to our practice and receives outpatient dialysis treatments at [facility] on a MWF schedule.  Update: Patient sitting up in bed Alert Feeding self Denies pain Denies shortness of breath  Dialysis yesterday, tolerated well  Objective:  Vital signs in last 24 hours:  Temp:  [98.1 F (36.7 C)-98.6 F (37 C)] 98.5 F (36.9 C) (08/08 0820) Pulse Rate:  [68-87] 79 (08/08 0820) Resp:  [12-18] 17 (08/08 0820) BP: (104-136)/(60-94) 136/94 (08/08 0820) SpO2:  [92 %-100 %] 99 % (08/08 0820) Weight:  [84.3 kg] 84.3 kg (08/07 1424)  Weight change:  Filed Weights   01/02/22 1244 01/05/22 1052 01/05/22 1424  Weight: 81.4 kg 85.7 kg 84.3 kg    Intake/Output: I/O last 3 completed shifts: In: -  Out: 900 [Other:900]   Intake/Output this shift:  No intake/output data recorded.  Physical Exam: General: NAD  Head: Normocephalic, atraumatic. Moist oral mucosal membranes  Eyes: Anicteric  Lungs:  Clear to auscultation, normal effort, room air  Heart: Regular rate and rhythm  Abdomen:  Soft, nontender, non distended  Extremities:  trace peripheral edema.  Neurologic: Alert, answer simple questions  Skin: No lesions  Access: Lt AVF     Basic Metabolic Panel: Recent Labs  Lab 01/02/22 0508 01/05/22 0959  NA 135 133*  K 4.4 5.3*  CL 98 93*  CO2 30 26  GLUCOSE 74 107*  BUN 41* 70*  CREATININE 5.26* 6.03*  CALCIUM 9.3 9.8  PHOS 2.3* 2.0*     Liver Function Tests: Recent Labs  Lab  01/02/22 0508 01/05/22 0959  ALBUMIN 2.4* 3.5    No results for input(s): "LIPASE", "AMYLASE" in the last 168 hours. No results for input(s): "AMMONIA" in the last 168 hours.  CBC: Recent Labs  Lab 01/02/22 0508 01/05/22 0959  WBC 6.2 5.1  NEUTROABS  --  3.9  HGB 7.8* 8.2*  HCT 22.4* 23.9*  MCV 96.6 98.4  PLT 192 215     Cardiac Enzymes: No results for input(s): "CKTOTAL", "CKMB", "CKMBINDEX", "TROPONINI" in the last 168 hours.  BNP: Invalid input(s): "POCBNP"  CBG: No results for input(s): "GLUCAP" in the last 168 hours.   Microbiology: Results for orders placed or performed during the hospital encounter of 12/15/21  SARS Coronavirus 2 by RT PCR (hospital order, performed in [facility] hospital lab) *cepheid single result test* Anterior Nasal Swab     Status: None   Collection Time: 12/15/21  5:50 PM   Specimen: Anterior Nasal Swab  Result Value Ref Range Status   SARS Coronavirus 2 by RT PCR NEGATIVE NEGATIVE Final    Comment: (NOTE) SARS-CoV-2 target nucleic acids are NOT DETECTED.  The SARS-CoV-2 RNA is generally detectable in upper and lower respiratory specimens during the acute phase of infection. The lowest concentration of SARS-CoV-2 viral copies this assay can detect is 250 copies / mL. A negative result does not preclude SARS-CoV-2 infection and should not be used as the sole basis for treatment or other patient management decisions.  A negative result may occur with improper specimen collection / handling, submission of specimen  other than nasopharyngeal swab, presence of viral mutation(s) within the areas targeted by this assay, and inadequate number of viral copies (<250 copies / mL). A negative result must be combined with clinical observations, patient history, and epidemiological information.  Fact Sheet for Patients:   https://www.patel.info/  Fact Sheet for Healthcare  Providers: https://hall.com/  This test is not yet approved or  cleared by the [place] FDA and has been authorized for detection and/or diagnosis of SARS-CoV-2 by FDA under an Emergency Use Authorization (EUA).  This EUA will remain in effect (meaning this test can be used) for the duration of the COVID-19 declaration under Section 564(b)(1) of the Act, 21 U.S.C. section 360bbb-3(b)(1), unless the authorization is terminated or revoked sooner.  Performed at [facility], [address] [place], [place] [address]   MRSA Next Gen by PCR, Nasal     Status: None   Collection Time: 12/15/21  9:03 PM   Specimen: Nasal Mucosa; Nasal Swab  Result Value Ref Range Status   MRSA by PCR Next Gen NOT DETECTED NOT DETECTED Final    Comment: (NOTE) The GeneXpert MRSA Assay (FDA approved for NASAL specimens only), is one component of a comprehensive MRSA colonization surveillance program. It is not intended to diagnose MRSA infection nor to guide or monitor treatment for MRSA infections. Test performance is not FDA approved in patients less than 47 years old. Performed at [facility], [address] [place], [place] [address]     Coagulation Studies: No results for input(s): "LABPROT", "INR" in the last 72 hours.  Urinalysis: No results for input(s): "COLORURINE", "LABSPEC", "PHURINE", "GLUCOSEU", "HGBUR", "BILIRUBINUR", "KETONESUR", "PROTEINUR", "UROBILINOGEN", "NITRITE", "LEUKOCYTESUR" in the last 72 hours.  Invalid input(s): "APPERANCEUR"    Imaging: No results found.   Medications:    anticoagulant sodium citrate      (feeding supplement) PROSource Plus  30 mL Oral TID BM   amLODipine  10 mg Oral q morning   calcium acetate  667 mg Oral TID WC   carbamazepine  1,000 mg Oral q AM   Chlorhexidine Gluconate Cloth  6 each Topical Daily   cloNIDine  0.2 mg Transdermal Weekly   doxazosin  4 mg Oral QHS   epoetin (EPOGEN/PROCRIT)  injection  10,000 Units Intravenous Q M,W,F-HD   ferrous sulfate  325 mg Oral Q breakfast   fluPHENAZine  2.5 mg Oral q AM   heparin  5,000 Units Subcutaneous Q8H   hydrALAZINE  10 mg Oral TID   irbesartan  300 mg Oral Daily   isosorbide mononitrate  60 mg Oral BID   magic mouthwash  10 mL Oral QID   minoxidil  5 mg Oral Daily   mometasone-formoterol  2 puff Inhalation BID   multivitamin  1 tablet Oral QHS   pantoprazole  40 mg Oral Daily   phosphorus  500 mg Oral Q4H   polyethylene glycol  17 g Oral BID   trihexyphenidyl  2 mg Oral BID WC   albuterol, alteplase, anticoagulant sodium citrate, diphenhydrAMINE, haloperidol lactate, heparin, hydrALAZINE, lidocaine (PF), lidocaine-prilocaine, ondansetron **OR** ondansetron (ZOFRAN) IV, mouth rinse, pentafluoroprop-tetrafluoroeth, polyethylene glycol, senna-docusate  Assessment/ Plan:  [name] is a 61 y.o.  male with past medical history including hypertension, anemia, schizophrenia and end-stage renal disease on hemodialysis.  Patient has presented to the ED with altered mental status.  Patient has been admitted for Altered mental status [R41.82] Hypertensive urgency [I16.0] Severe hypertension [I10] Altered mental status, unspecified altered mental status type [  R41.82]  [facility]/MWF/Lt AVF  End-stage renal disease on hemodialysis.  Will maintain outpatient schedule if possible.   Dialysis received yesterday, UF 953ml achieved. Next treatment scheduled for Wednesday.     2. Anemia of chronic kidney disease Lab Results  Component Value Date   HGB 8.2 (L) 01/05/2022    Mircera prescribed outpatient. Continue EPO with dialysis   3. Secondary Hyperparathyroidism:  Lab Results  Component Value Date   CALCIUM 9.8 01/05/2022   PHOS 2.0 (L) 01/05/2022   Continue to hold calcium acetate due to low phosphorus. Will monitor with increased appetite and determine reinitiation.   4.  Hypertension with chronic kidney  disease.  Home regimen includes amlodipine, clonidine, hydralazine, isosorbide, and losartan.    Currently on amlodipine 10 mg daily, clonidine patch 0.2 mg per 24 hours, Cardura 4 mg daily at bedtime, hydralazine 10 mg 3 times per day, Avapro 300 mg daily, isosorbide 60 mg twice a day, minoxidil 5 mg daily.  Blood pressure 136/94     LOS: 22   8/8/202312:16 PM

## 2022-01-06 NOTE — Progress Notes (Signed)
[facility] Progress Note  Patient: Todd Brown    IZT:[id number]  DOA: 12/15/2021    Date of Service: the patient was seen and examined on 01/06/2022  Brief hospital course: 61 year old male with past medical history of hypertension and end-stage renal disease on hemodialysis presented to the emergency room from his group home/skilled nursing facility on 7/17 with confusion.  (Patient's baseline is that he is unable to care for himself and has a legal guardian.)  Patient last received dialysis on Friday, 7/14.  FPatient found to have markedly elevated blood pressure with a systolic in the 382N requiring Cardene drip and placement of patient to the ICU.  Patient with questionable cardiac ischemia as well.  CT scan of head and neurological exam unremarkable.  Patient was admitted to the hospitalist service and started on his oral antihypertensives, nicardipine drip and given IV labetalol and as needed IV hydralazine.  Nephrology consulted and with plans to resume patient's dialysis  Attempts to wean off Cardene have led to increasing high blood pressure, compounded by patient at first refusing to take oral medications.  Had become more physically agitated, wanting to be left alone.  Seen by psychiatry and started on scheduled and as needed Haldol.  Has become more compliant and better mentating and taking medications and blood pressure improving.  Also developed some periorbital swelling bilaterally felt to be secondary to subcutaneous emphysema and started on steroids, and periorbital swelling has since resolved.  Over time, blood pressure became more manageable.  Patient was supposed to be discharged back to his assisted living facility on 7/21 after dialysis.  His baseline is that he is able to sit in a chair for dialysis although some assistance.  And trying to get him out of bed and placed in a wheelchair, took 3 people and still patient unable to sit and stay in chair.  Discharge canceled and plan  is for patient to have PT/OT evaluation and possible skilled nursing prior to returning to assisted living.  PT and OT are recommending rehab.  Patient with very limited options in terms of excepting skilled nursing facilities.  Found to have some oral thrush.  Started on Magic mouthwash and prednisone was discontinued.  Patient did have to return to stepdown on 7/25 with restarting of Cardene drip which was able to be discontinued by 7/26.  At this time, he is still waiting for skilled nursing.  Blood pressure has become much more manageable and additional antihypertensives that were added to patient's medication regimen during this hospitalization are being scaled back.  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  Resolved and now back at baseline.  Had some increased somnolence which improved after stopping his scheduled Benadryl and Haldol.  Hypertensive emergency-resolved as of 12/31/2021 Resolved.  See below.  ESRD on dialysis [facility]) Nephrology consulted, full dialysis session following admission and again today to keep him on his Monday Wednesday Friday schedule. -Continue scheduled dialysis  Mental health disorder Unspecified.  Patient is unable to care for himself.  It is unclear if he is ever had a formal diagnosis.  He is on Tegretol and Artane which he should continue.  As needed Haldol for acute behavior agitation as above.  He is for the most part much more calm and cooperative these days over times, he intermittently refused vitals being checked or taking his medications.  Essential hypertension Resumed on his home p.o. amlodipine 10 mg daily,  clonidine 0.3 mg p.o. 3 times daily, hydralazine 100 mg p.o. 3 times daily, isosorbide mononitrate 60 mg p.o. twice daily, losartan 100 mg daily.  Pressures at time improved, but then blood pressure starts to spike again.  As above, changing  ARB and adding alpha-blocker. -Nephrology added clonidine patches with p.o. clonidine. -Minoxidil was then added and titrated up to 10 mg daily.  Over the past week, occasionally having episodes of lower blood pressure without any blood pressure spikes, so we will decrease hydralazine further down to 5 mg 3 times daily and have decreased his minoxidil from 10 mg to 5 mg  Oral thrush-resolved as of 01/02/2022 Improved with Magic mouthwash  Subcutaneous emphysema (HCC)-resolved as of 12/18/2021 On steroids.  Appreciate pulmonary assistance.  Subcutaneous emphysema looks to be resolved.  Changed from Solu-Medrol to p.o. prednisone quick taper.  Physical deconditioning PT OT are recommending SNF.  However, this is similar to a previous hospitalization where TOC was unable to find skilled nursing and over time, patient's strength improved that he was able to be discharged back to his assisted living facility.  Malnutrition of moderate degree Nutrition Status: Nutrition Problem: Moderate Malnutrition Etiology: chronic illness (ESRD on HD) Signs/Symptoms: mild fat depletion, mild muscle depletion, moderate muscle depletion Interventions: Refer to RD note for recommendations, Nepro shake, MVI, Prostat       Body mass index is 25.21 kg/m.  Nutrition Problem: Moderate Malnutrition Etiology: chronic illness (ESRD on HD)     Consultants: Nephrology Critical care Psychiatry  Procedures: Hemodialysis   Antimicrobials: None  Code Status: Full code   Subjective: Patient doing okay, no complaints  Objective: Blood pressures have remained stable Vitals:   01/06/22 0820 01/06/22 1619  BP: (!) 136/94 102/62  Pulse: 79 71  Resp: 17 17  Temp: 98.5 F (36.9 C) (!) 97.4 F (36.3 C)  SpO2: 99% 98%   No intake or output data in the 24 hours ending 01/06/22 1758    Filed Weights   01/02/22 1244 01/05/22 1052 01/05/22 1424  Weight: 81.4 kg 85.7 kg 84.3 kg   Body mass index is  25.21 kg/m.  Exam:  General: Remains quiet, little more interactive today HEENT: Normocephalic and atraumatic, mucous membranes moist Cardiovascular: Regular rate and rhythm, S1-S2, 2 out of 6 systolic ejection murmur Respiratory: Clear to auscultation bilaterally Abdomen: Soft, nontender, nondistended, hypoactive bowel sounds Musculoskeletal: No clubbing or cyanosis or edema Skin: No skin breaks, tears or lesions Psychiatry: Appropriate, no evidence of psychoses, flattened affect Neurology: Difficult to assess secondary to compliance  Data Reviewed: Labs from 8/7 noted, stable hemoglobin  Disposition:  Status is: Inpatient Remains inpatient appropriate because: Looking for skilled nursing facility options or hopefully patient will progress with physical therapy to the ability to return back to his assisted living    Anticipated discharge date: Unknown  Family Communication: No family, message left for guardian DVT Prophylaxis: heparin injection 5,000 Units Start: 12/15/21 1645 Place TED hose Start: 12/15/21 1629    Author: Annita Brod ,MD 01/06/2022 5:58 PM  To reach On-call, see care teams to locate the attending and reach out via [email or web address]. Between 7PM-7AM, please contact night-coverage If you still have difficulty reaching the attending provider, please page the [facility] (Director on Call) for [facility] on amion for assistance.

## 2022-01-07 DIAGNOSIS — I10 Essential (primary) hypertension: Secondary | ICD-10-CM | POA: Diagnosis not present

## 2022-01-07 DIAGNOSIS — R5381 Other malaise: Secondary | ICD-10-CM | POA: Diagnosis not present

## 2022-01-07 DIAGNOSIS — Z992 Dependence on renal dialysis: Secondary | ICD-10-CM | POA: Diagnosis not present

## 2022-01-07 DIAGNOSIS — N186 End stage renal disease: Secondary | ICD-10-CM | POA: Diagnosis not present

## 2022-01-07 LAB — CBC WITH DIFFERENTIAL/PLATELET
Abs Immature Granulocytes: 0.02 10*3/uL (ref 0.00–0.07)
Basophils Absolute: 0 10*3/uL (ref 0.0–0.1)
Basophils Relative: 1 %
Eosinophils Absolute: 0.1 10*3/uL (ref 0.0–0.5)
Eosinophils Relative: 2 %
HCT: 21.6 % — ABNORMAL LOW (ref 39.0–52.0)
Hemoglobin: 7.3 g/dL — ABNORMAL LOW (ref 13.0–17.0)
Immature Granulocytes: 1 %
Lymphocytes Relative: 14 %
Lymphs Abs: 0.6 10*3/uL — ABNORMAL LOW (ref 0.7–4.0)
MCH: 33.3 pg (ref 26.0–34.0)
MCHC: 33.8 g/dL (ref 30.0–36.0)
MCV: 98.6 fL (ref 80.0–100.0)
Monocytes Absolute: 0.8 10*3/uL (ref 0.1–1.0)
Monocytes Relative: 18 %
Neutro Abs: 2.9 10*3/uL (ref 1.7–7.7)
Neutrophils Relative %: 64 %
Platelets: 211 10*3/uL (ref 150–400)
RBC: 2.19 MIL/uL — ABNORMAL LOW (ref 4.22–5.81)
RDW: 15.4 % (ref 11.5–15.5)
WBC: 4.4 10*3/uL (ref 4.0–10.5)
nRBC: 0 % (ref 0.0–0.2)

## 2022-01-07 LAB — RENAL FUNCTION PANEL
Albumin: 2.9 g/dL — ABNORMAL LOW (ref 3.5–5.0)
Anion gap: 9 (ref 5–15)
BUN: 73 mg/dL — ABNORMAL HIGH (ref 8–23)
CO2: 28 mmol/L (ref 22–32)
Calcium: 9.3 mg/dL (ref 8.9–10.3)
Chloride: 96 mmol/L — ABNORMAL LOW (ref 98–111)
Creatinine, Ser: 6.41 mg/dL — ABNORMAL HIGH (ref 0.61–1.24)
GFR, Estimated: 9 mL/min — ABNORMAL LOW (ref >60)
Glucose, Bld: 80 mg/dL (ref 70–99)
Phosphorus: 3 mg/dL (ref 2.5–4.6)
Potassium: 5.6 mmol/L — ABNORMAL HIGH (ref 3.5–5.1)
Sodium: 133 mmol/L — ABNORMAL LOW (ref 135–145)

## 2022-01-07 MED ORDER — EPOETIN ALFA 10000 UNIT/ML IJ SOLN
INTRAMUSCULAR | Status: AC
  Filled 2022-01-07: qty 1

## 2022-01-07 NOTE — Progress Notes (Signed)
[facility]  ROUNDING NOTE   Subjective:   Todd Brown is a 61 year old male with past medical history including hypertension, anemia, schizophrenia and end-stage renal disease on hemodialysis.  Patient has presented to the ED with altered mental status.  Patient has been admitted for Altered mental status [R41.82] Hypertensive urgency [I16.0] Severe hypertension [I10] Altered mental status, unspecified altered mental status type [R41.82]  Patient is known to our practice and receives outpatient dialysis treatments at [facility] on a MWF schedule.  Update: Patient seen and evaluated during dialysis.   HEMODIALYSIS FLOWSHEET:  Blood Flow Rate (mL/min): 400 mL/min Arterial Pressure (mmHg): -120 mmHg Venous Pressure (mmHg): 250 mmHg TMP (mmHg): 3 mmHg Ultrafiltration Rate (mL/min): 545 mL/min Dialysate Flow Rate (mL/min): 300 ml/min Dialysis Fluid Bolus: Normal Saline Bolus Amount (mL): 300 mL  No complaints during this treatment  Objective:  Vital signs in last 24 hours:  Temp:  [97.4 F (36.3 C)-98.5 F (36.9 C)] 98.3 F (36.8 C) (08/09 1218) Pulse Rate:  [66-93] 71 (08/09 1218) Resp:  [12-25] 25 (08/09 1218) BP: (99-193)/(61-149) 121/64 (08/09 1218) SpO2:  [94 %-100 %] 99 % (08/09 1218) Weight:  [85.8 kg-87.3 kg] 85.8 kg (08/09 1218)  Weight change:  Filed Weights   01/05/22 1424 01/07/22 0814 01/07/22 1218  Weight: 84.3 kg 87.3 kg 85.8 kg    Intake/Output: I/O last 3 completed shifts: In: 240 [P.O.:240] Out: -    Intake/Output this shift:  Total I/O In: 240 [P.O.:240] Out: 1500 [Other:1500]  Physical Exam: General: NAD  Head: Normocephalic, atraumatic. Moist oral mucosal membranes  Eyes: Anicteric  Lungs:  Clear to auscultation, normal effort, room air  Heart: Regular rate and rhythm  Abdomen:  Soft, nontender, non distended  Extremities:  trace peripheral edema.  Neurologic: Alert, answer simple questions  Skin: No lesions   Access: Lt AVF     Basic Metabolic Panel: Recent Labs  Lab 01/02/22 0508 01/05/22 0959 01/07/22 0734  NA 135 133* 133*  K 4.4 5.3* 5.6*  CL 98 93* 96*  CO2 30 26 28   GLUCOSE 74 107* 80  BUN 41* 70* 73*  CREATININE 5.26* 6.03* 6.41*  CALCIUM 9.3 9.8 9.3  PHOS 2.3* 2.0* 3.0     Liver Function Tests: Recent Labs  Lab 01/02/22 0508 01/05/22 0959 01/07/22 0734  ALBUMIN 2.4* 3.5 2.9*    No results for input(s): "LIPASE", "AMYLASE" in the last 168 hours. No results for input(s): "AMMONIA" in the last 168 hours.  CBC: Recent Labs  Lab 01/02/22 0508 01/05/22 0959 01/07/22 0734  WBC 6.2 5.1 4.4  NEUTROABS  --  3.9 2.9  HGB 7.8* 8.2* 7.3*  HCT 22.4* 23.9* 21.6*  MCV 96.6 98.4 98.6  PLT 192 215 211     Cardiac Enzymes: No results for input(s): "CKTOTAL", "CKMB", "CKMBINDEX", "TROPONINI" in the last 168 hours.  BNP: Invalid input(s): "POCBNP"  CBG: No results for input(s): "GLUCAP" in the last 168 hours.   Microbiology: Results for orders placed or performed during the hospital encounter of 12/15/21  SARS Coronavirus 2 by RT PCR (hospital order, performed in [facility] hospital lab) *cepheid single result test* Anterior Nasal Swab     Status: None   Collection Time: 12/15/21  5:50 PM   Specimen: Anterior Nasal Swab  Result Value Ref Range Status   SARS Coronavirus 2 by RT PCR NEGATIVE NEGATIVE Final    Comment: (NOTE) SARS-CoV-2 target nucleic acids are NOT DETECTED.  The SARS-CoV-2 RNA is generally detectable in  upper and lower respiratory specimens during the acute phase of infection. The lowest concentration of SARS-CoV-2 viral copies this assay can detect is 250 copies / mL. A negative result does not preclude SARS-CoV-2 infection and should not be used as the sole basis for treatment or other patient management decisions.  A negative result may occur with improper specimen collection / handling, submission of specimen other than nasopharyngeal  swab, presence of viral mutation(s) within the areas targeted by this assay, and inadequate number of viral copies (<250 copies / mL). A negative result must be combined with clinical observations, patient history, and epidemiological information.  Fact Sheet for Patients:   https://www.patel.info/  Fact Sheet for Healthcare Providers: https://hall.com/  This test is not yet approved or  cleared by the [place] FDA and has been authorized for detection and/or diagnosis of SARS-CoV-2 by FDA under an Emergency Use Authorization (EUA).  This EUA will remain in effect (meaning this test can be used) for the duration of the COVID-19 declaration under Section 564(b)(1) of the Act, 21 U.S.C. section 360bbb-3(b)(1), unless the authorization is terminated or revoked sooner.  Performed at [facility], [address] [place], [place] [address]   MRSA Next Gen by PCR, Nasal     Status: None   Collection Time: 12/15/21  9:03 PM   Specimen: Nasal Mucosa; Nasal Swab  Result Value Ref Range Status   MRSA by PCR Next Gen NOT DETECTED NOT DETECTED Final    Comment: (NOTE) The GeneXpert MRSA Assay (FDA approved for NASAL specimens only), is one component of a comprehensive MRSA colonization surveillance program. It is not intended to diagnose MRSA infection nor to guide or monitor treatment for MRSA infections. Test performance is not FDA approved in patients less than 61 years old. Performed at [facility], [address] [place], [place] [address]     Coagulation Studies: No results for input(s): "LABPROT", "INR" in the last 72 hours.  Urinalysis: No results for input(s): "COLORURINE", "LABSPEC", "PHURINE", "GLUCOSEU", "HGBUR", "BILIRUBINUR", "KETONESUR", "PROTEINUR", "UROBILINOGEN", "NITRITE", "LEUKOCYTESUR" in the last 72 hours.  Invalid input(s): "APPERANCEUR"    Imaging: No results found.   Medications:     anticoagulant sodium citrate      (feeding supplement) PROSource Plus  30 mL Oral TID BM   amLODipine  10 mg Oral q morning   calcium acetate  667 mg Oral TID WC   carbamazepine  1,000 mg Oral q AM   Chlorhexidine Gluconate Cloth  6 each Topical Daily   cloNIDine  0.2 mg Transdermal Weekly   doxazosin  4 mg Oral QHS   epoetin alfa       epoetin (EPOGEN/PROCRIT) injection  10,000 Units Intravenous Q M,W,F-HD   ferrous sulfate  325 mg Oral Q breakfast   fluPHENAZine  2.5 mg Oral q AM   heparin  5,000 Units Subcutaneous Q8H   hydrALAZINE  5 mg Oral TID   irbesartan  300 mg Oral Daily   isosorbide mononitrate  60 mg Oral BID   magic mouthwash  10 mL Oral QID   minoxidil  5 mg Oral Daily   mometasone-formoterol  2 puff Inhalation BID   multivitamin  1 tablet Oral QHS   pantoprazole  40 mg Oral Daily   polyethylene glycol  17 g Oral BID   trihexyphenidyl  2 mg Oral BID WC   albuterol, alteplase, anticoagulant sodium citrate, diphenhydrAMINE, epoetin alfa, haloperidol lactate, heparin, hydrALAZINE, lidocaine (PF), lidocaine-prilocaine, ondansetron **OR** ondansetron (ZOFRAN) IV,  mouth rinse, pentafluoroprop-tetrafluoroeth, polyethylene glycol, senna-docusate  Assessment/ Plan:  [name] is a 61 y.o.  male with past medical history including hypertension, anemia, schizophrenia and end-stage renal disease on hemodialysis.  Patient has presented to the ED with altered mental status.  Patient has been admitted for Altered mental status [R41.82] Hypertensive urgency [I16.0] Severe hypertension [I10] Altered mental status, unspecified altered mental status type [R41.82]  [facility]/MWF/Lt AVF  End-stage renal disease on hemodialysis.  Will maintain outpatient schedule if possible.   Scheduled to receive dialysis today, UF goal 1L. Next treatment scheduled Friday.     2. Anemia of chronic kidney disease Lab Results  Component Value Date   HGB 7.3 (L) 01/07/2022     Mircera prescribed outpatient. EPO with dialysis.    3. Secondary Hyperparathyroidism:  Lab Results  Component Value Date   CALCIUM 9.3 01/07/2022   PHOS 3.0 01/07/2022   Phosphorus increasing with oral intake. Will continue to monitor and reorder binders when needed.    4.  Hypertension with chronic kidney disease.  Home regimen includes amlodipine, clonidine, hydralazine, isosorbide, and losartan.    Currently on amlodipine 10 mg daily, clonidine patch 0.2 mg per 24 hours, Cardura 4 mg daily at bedtime, hydralazine 10 mg 3 times per day, Avapro 300 mg daily, isosorbide 60 mg twice a day, minoxidil 5 mg daily.  Blood pressure 103/74 during dialysis    LOS: [place] 8/9/202312:31 PM

## 2022-01-07 NOTE — Progress Notes (Signed)
Hd tx of 3.5hrs completed. 83.9L total vol processed. No complications noted. Report given to cayley oldham, rn Total uf removed: 1542ml Post hd weight: 85.8kgs Post hd v/s: 98.3 121/64(81) 71 25 99%

## 2022-01-07 NOTE — Progress Notes (Signed)
Patient is still upright eating breakfast. No complications with treatment at this time.

## 2022-01-07 NOTE — Progress Notes (Signed)
Physical Therapy Treatment Patient Details Name: Todd Brown MRN: [id number] DOB: 09/15/1960 Today's Date: 01/07/2022   History of Present Illness 61 year old male with past medical history of hypertension and end-stage renal disease on hemodialysis presented to the emergency room from his group home/skilled nursing facility on 7/17 with confusion.  (Patient's baseline is that he is unable to care for himself and has a legal guardian.)  Patient last received dialysis on Friday, 7/14.  From being in the emergency room on 7/17, unable to get dialysis yesterday.  Patient found to have markedly elevated blood pressure with a systolic in the 010X.  Patient with questionable cardiac ischemia as well    PT Comments    Patient continues to require minimal assistance for transfers using rolling walker. He requires steadying assistance and cues for safety due to vision impairments. He might be nearing his baseline level of functional mobility. Recommend to continue PT to maximize independence and decrease caregiver burden. Consider discharge back to previous environment if patient has intermittent assistance, otherwise will need SNF.    Recommendations for follow up therapy are one component of a multi-disciplinary discharge planning process, led by the attending physician.  Recommendations may be updated based on patient status, additional functional criteria and insurance authorization.  Follow Up Recommendations  Skilled nursing-short term rehab (<3 hours/day) Can patient physically be transported by private vehicle: No   Assistance Recommended at Discharge Intermittent  Supervision/Assistance  Patient can return home with the following A little help with walking and/or transfers;A little help with bathing/dressing/bathroom;Help with stairs or ramp for entrance;Assist for transportation;Direct supervision/assist for medications management   Equipment Recommendations  Rolling walker (2  wheels);BSC/3in1;Wheelchair cushion (measurements PT);Wheelchair (measurements PT)    Recommendations for Other Services       Precautions / Restrictions Precautions Precautions: Fall Precaution Comments: vision impaired Restrictions Weight Bearing Restrictions: No     Mobility  Bed Mobility Overal bed mobility: Needs Assistance Bed Mobility: Sit to Supine       Sit to supine: Mod assist   General bed mobility comments: assistance for BLE support with verbal cues for technique with some assistance required only due to vision impairments    Transfers Overall transfer level: Needs assistance Equipment used: Rolling walker (2 wheels) Transfers: Bed to chair/wheelchair/BSC     Step pivot transfers: Min assist       General transfer comment: steadying assistance provided. verbal cues for technique and sequencing with rolling walker. patient was able to take several steps to complete transfer.    Ambulation/Gait               General Gait Details: not attempted due to fatigue with standing, poor standing tolerance   Stairs             Wheelchair Mobility    Modified Rankin (Stroke Patients Only)       Balance                                            Cognition Arousal/Alertness: Awake/alert Behavior During Therapy: WFL for tasks assessed/performed Overall Cognitive Status: No family/caregiver present to determine baseline cognitive functioning                         Following Commands: Follows one step commands inconsistently Safety/Judgement: Decreased awareness of safety, Decreased awareness of deficits  Problem Solving: Slow processing, Decreased initiation, Difficulty sequencing, Requires verbal cues, Requires tactile cues General Comments: patient needs safety cues with mobility partially due to impaired vision        Exercises      General Comments General comments (skin integrity, edema, etc.): spo2  down to 85% via 2L via [place] with STS, recovered to >90% throughout      Pertinent Vitals/Pain Pain Assessment Pain Assessment: No/denies pain    Home Living                          Prior Function            PT Goals (current goals can now be found in the care plan section) Acute Rehab PT Goals Patient Stated Goal: to go back to bed PT Goal Formulation: With patient Time For Goal Achievement: 01/08/22 Potential to Achieve Goals: Fair Progress towards PT goals: Progressing toward goals    Frequency    Min 2X/week      PT Plan Current plan remains appropriate    Co-evaluation              AM-PAC PT "6 Clicks" Mobility   Outcome Measure  Help needed turning from your back to your side while in a flat bed without using bedrails?: A Little Help needed moving from lying on your back to sitting on the side of a flat bed without using bedrails?: A Little Help needed moving to and from a bed to a chair (including a wheelchair)?: A Little Help needed standing up from a chair using your arms (e.g., wheelchair or bedside chair)?: A Little Help needed to walk in hospital room?: A Little Help needed climbing 3-5 steps with a railing? : Total 6 Click Score: 16    End of Session Equipment Utilized During Treatment: Gait belt Activity Tolerance: Patient tolerated treatment well Patient left: in bed;with call bell/phone within reach;with bed alarm set Nurse Communication: Mobility status (discussed with nurse aide) PT Visit Diagnosis: Other abnormalities of gait and mobility (R26.89);Muscle weakness (generalized) (M62.81);Unsteadiness on feet (R26.81)     Time: 6734-1937 PT Time Calculation (min) (ACUTE ONLY): 13 min  Charges:  $Therapeutic Activity: 8-22 mins                     [name], PT, MPT    [name] 01/07/2022, 3:31 PM

## 2022-01-07 NOTE — Plan of Care (Signed)

## 2022-01-07 NOTE — Progress Notes (Signed)
Tx initiated without complication, accessed avf with 15g needles. Patient is sitting upright eating breakfast at this time, will attempt a uf goal of 1560ml.

## 2022-01-07 NOTE — Progress Notes (Signed)
Occupational Therapy Treatment Patient Details Name: Todd Brown MRN: [id number] DOB: 11/18/1960 Today's Date: 01/07/2022   History of present illness 61 year old male with past medical history of hypertension and end-stage renal disease on hemodialysis presented to the emergency room from his group home/skilled nursing facility on 7/17 with confusion.  (Patient's baseline is that he is unable to care for himself and has a legal guardian.)  Patient last received dialysis on Friday, 7/14.  From being in the emergency room on 7/17, unable to get dialysis yesterday.  Patient found to have markedly elevated blood pressure with a systolic in the 235T.  Patient with questionable cardiac ischemia as well   OT comments  Chart reviewed to date, pt greeted in chair agreeable to OT tx session. Re-evaluation completed on this date. Pt is showing progress towards goals, goals have been updated. Pt appears to be approaching to reported baseline (SPT to mwc at group home), performing STS with CGA on this date 5 attempts. Pt is left as received, NAD, all needs met. OT will follow acutely.    Recommendations for follow up therapy are one component of a multi-disciplinary discharge planning process, led by the attending physician.  Recommendations may be updated based on patient status, additional functional criteria and insurance authorization.    Follow Up Recommendations  Skilled nursing-short term rehab (<3 hours/day)    Assistance Recommended at Discharge Frequent or constant Supervision/Assistance  Patient can return home with the following  A lot of help with bathing/dressing/bathroom;A lot of help with walking and/or transfers;Assistance with feeding;Assist for transportation;Direct supervision/assist for medications management;Direct supervision/assist for financial management;Assistance with cooking/housework;Help with stairs or ramp for entrance   Equipment Recommendations  Other (comment) (per next  venue of care)    Recommendations for Other Services      Precautions / Restrictions Precautions Precautions: Fall Precaution Comments: vision impaired Restrictions Weight Bearing Restrictions: No       Mobility Bed Mobility               General bed mobility comments: NT pt in recliner pre/post session    Transfers Overall transfer level: Needs assistance Equipment used: Rolling walker (2 wheels) Transfers: Sit to/from Stand Sit to Stand: Min guard (5 attempts with frequent vcs due to visual impairment)                 Balance Overall balance assessment: Needs assistance Sitting-balance support: Feet supported Sitting balance-[name]: Fair   Postural control: Right lateral lean Standing balance support: Bilateral upper extremity supported Standing balance-[name]: Fair Standing balance comment: pt able to remain in standing for up to 1 minute                           ADL either performed or assessed with clinical judgement   ADL Overall ADL's : Needs assistance/impaired Eating/Feeding: Supervision/ safety;Set up   Grooming: Sitting;Set up;Wash/dry face               Lower Body Dressing: Maximal assistance       Toileting- Clothing Manipulation and Hygiene: Sit to/from stand;Maximal assistance              Extremity/Trunk Assessment Upper Extremity Assessment Upper Extremity Assessment: Generalized weakness   Lower Extremity Assessment Lower Extremity Assessment: Generalized weakness        Vision Baseline Vision/History: 2 Legally blind Patient Visual Report: No change from baseline     Perception  Praxis      Cognition Arousal/Alertness: Awake/alert Behavior During Therapy: WFL for tasks assessed/performed Overall Cognitive Status: No family/caregiver present to determine baseline cognitive functioning Area of Impairment: Orientation, Attention, Following commands, Safety/judgement, Awareness,  Problem solving                 Orientation Level: Disoriented to, Situation Current Attention Level: Sustained   Following Commands: Follows one step commands with increased time Safety/Judgement: Decreased awareness of safety, Decreased awareness of deficits Awareness: Intellectual Problem Solving: Slow processing, Decreased initiation, Difficulty sequencing, Requires verbal cues, Requires tactile cues          Exercises      Shoulder Instructions       General Comments spo2 down to 85% via 2L via [place] with STS, recovered to >90% throughout    Pertinent Vitals/ Pain       Pain Assessment Pain Assessment: No/denies pain  Home Living                                          Prior Functioning/Environment              Frequency  Min 2X/week        Progress Toward Goals  OT Goals(current goals can now be found in the care plan section)  Progress towards OT goals: Progressing toward goals     Plan Discharge plan remains appropriate;Frequency remains appropriate    Co-evaluation                 AM-PAC OT "6 Clicks" Daily Activity     Outcome Measure   Help from another person eating meals?: None Help from another person taking care of personal grooming?: A Little Help from another person toileting, which includes using toliet, bedpan, or urinal?: A Little Help from another person bathing (including washing, rinsing, drying)?: A Lot Help from another person to put on and taking off regular upper body clothing?: A Little Help from another person to put on and taking off regular lower body clothing?: A Lot 6 Click Score: 17    End of Session Equipment Utilized During Treatment: Rolling walker (2 wheels);Oxygen  OT Visit Diagnosis: Unsteadiness on feet (R26.81);Muscle weakness (generalized) (M62.81)   Activity Tolerance Patient tolerated treatment well   Patient Left in chair;with call bell/phone within reach;with chair alarm  set   Nurse Communication          Time: 4259-5638 OT Time Calculation (min): 11 min  Charges: OT General Charges $OT Visit: 1 Visit OT Evaluation $OT Re-eval: 1 Re-eval OT Treatments $Therapeutic Activity: 8-22 mins Shanon Payor, OTD OTR/L  01/07/22, 3:30 PM

## 2022-01-07 NOTE — Progress Notes (Addendum)
PROGRESS NOTE    Todd Brown  DTO:[id number] DOB: 1961-01-14 DOA: 12/15/2021 PCP: Jodi Marble, MD  Assessment & Plan:   Active Problems:   ESRD on dialysis [facility])   Mental health disorder   Essential hypertension   Physical deconditioning   Malnutrition of moderate degree  Assessment and Plan: Acute metabolic encephalopathy: resolved. Mental status is back to baseline   Hyperkalemia: needs HD    Hypertensive emergency: resolved    ESRD: on HD MWF. Management as per nephro   ACD: likely secondary to ESRD. Will transfuse if Hb < 7.0   Mental health disorder: unspecified. Unable to care for himself. Continue on home dose of tegretol, trihexyphenidyl  HTN: continue on amlodipine, clonidine, imdur, losartan, minoxidil, cardura & reduced dose of hydralazine    Oral thrush: resolved as of 01/02/2022   Subcutaneous emphysema: resolved as of 12/18/2021. Continue on steroid taper  Physical deconditioning: PT/OT recs SNF   Moderate malnutrition: continue w/ nutritional supplements       DVT prophylaxis: heparin  Code Status: full  Family Communication:  Disposition Plan: possibly d/c to SNF   Level of care: Med-Surg  Status is: Inpatient Remains inpatient appropriate because: severity of illness   Consultants:  Nephro   Procedures:   Antimicrobials:   Subjective: Pt c/o being hungry   Objective: Vitals:   01/06/22 0820 01/06/22 1619 01/06/22 2016 01/07/22 0436  BP: (!) 136/94 102/62 134/68 124/76  Pulse: 79 71 74 71  Resp: 17 17 17 18   Temp: 98.5 F (36.9 C) (!) 97.4 F (36.3 C) 97.8 F (36.6 C) 98.2 F (36.8 C)  TempSrc:      SpO2: 99% 98% 95% 96%  Weight:      Height:        Intake/Output Summary (Last 24 hours) at 01/07/2022 0739 Last data filed at 01/06/2022 1900 Gross per 24 hour  Intake 240 ml  Output --  Net 240 ml   Filed Weights   01/02/22 1244 01/05/22 1052 01/05/22 1424  Weight: 81.4 kg 85.7 kg 84.3 kg     Examination:  General exam: Appears calm and comfortable  Respiratory system: Clear to auscultation. Respiratory effort normal. Cardiovascular system: S1 & S2+. No rubs, gallops or clicks. 2+ pitting edema of b/l LE Gastrointestinal system: Abdomen is nondistended, soft and nontender. Normal bowel sounds heard. Central nervous system: Alert and awake. Moves all extremities  Psychiatry: Judgement and insight appear normal. Mood & affect appropriate.     Data Reviewed: I have personally reviewed following labs and imaging studies  CBC: Recent Labs  Lab 01/02/22 0508 01/05/22 0959  WBC 6.2 5.1  NEUTROABS  --  3.9  HGB 7.8* 8.2*  HCT 22.4* 23.9*  MCV 96.6 98.4  PLT 192 983   Basic Metabolic Panel: Recent Labs  Lab 01/02/22 0508 01/05/22 0959  NA 135 133*  K 4.4 5.3*  CL 98 93*  CO2 30 26  GLUCOSE 74 107*  BUN 41* 70*  CREATININE 5.26* 6.03*  CALCIUM 9.3 9.8  PHOS 2.3* 2.0*   GFR: Estimated Creatinine Clearance: 14.1 mL/min (A) (by C-G formula based on SCr of 6.03 mg/dL (H)). Liver Function Tests: Recent Labs  Lab 01/02/22 0508 01/05/22 0959  ALBUMIN 2.4* 3.5   No results for input(s): "LIPASE", "AMYLASE" in the last 168 hours. No results for input(s): "AMMONIA" in the last 168 hours. Coagulation Profile: No results for input(s): "INR", "PROTIME" in the last 168 hours. Cardiac Enzymes: No results for input(s): "CKTOTAL", "  CKMB", "CKMBINDEX", "TROPONINI" in the last 168 hours. BNP (last 3 results) No results for input(s): "PROBNP" in the last 8760 hours. HbA1C: No results for input(s): "HGBA1C" in the last 72 hours. CBG: No results for input(s): "GLUCAP" in the last 168 hours. Lipid Profile: No results for input(s): "CHOL", "HDL", "LDLCALC", "TRIG", "CHOLHDL", "LDLDIRECT" in the last 72 hours. Thyroid Function Tests: No results for input(s): "TSH", "T4TOTAL", "FREET4", "T3FREE", "THYROIDAB" in the last 72 hours. Anemia Panel: No results for input(s):  "VITAMINB12", "FOLATE", "FERRITIN", "TIBC", "IRON", "RETICCTPCT" in the last 72 hours. Sepsis Labs: No results for input(s): "PROCALCITON", "LATICACIDVEN" in the last 168 hours.  No results found for this or any previous visit (from the past 240 hour(s)).       Radiology Studies: No results found.      Scheduled Meds:  (feeding supplement) PROSource Plus  30 mL Oral TID BM   amLODipine  10 mg Oral q morning   calcium acetate  667 mg Oral TID WC   carbamazepine  1,000 mg Oral q AM   Chlorhexidine Gluconate Cloth  6 each Topical Daily   cloNIDine  0.2 mg Transdermal Weekly   doxazosin  4 mg Oral QHS   epoetin (EPOGEN/PROCRIT) injection  10,000 Units Intravenous Q M,W,F-HD   ferrous sulfate  325 mg Oral Q breakfast   fluPHENAZine  2.5 mg Oral q AM   heparin  5,000 Units Subcutaneous Q8H   hydrALAZINE  5 mg Oral TID   irbesartan  300 mg Oral Daily   isosorbide mononitrate  60 mg Oral BID   magic mouthwash  10 mL Oral QID   minoxidil  5 mg Oral Daily   mometasone-formoterol  2 puff Inhalation BID   multivitamin  1 tablet Oral QHS   pantoprazole  40 mg Oral Daily   polyethylene glycol  17 g Oral BID   trihexyphenidyl  2 mg Oral BID WC   Continuous Infusions:  anticoagulant sodium citrate       LOS: 23 days    Time spent: 33 mins     Wyvonnia Dusky, MD [facility] Pager [phone]  If 7PM-7AM, please contact night-coverage [email or web address] 01/07/2022, 7:39 AM

## 2022-01-08 DIAGNOSIS — N186 End stage renal disease: Secondary | ICD-10-CM | POA: Diagnosis not present

## 2022-01-08 DIAGNOSIS — Z992 Dependence on renal dialysis: Secondary | ICD-10-CM | POA: Diagnosis not present

## 2022-01-08 DIAGNOSIS — R5381 Other malaise: Secondary | ICD-10-CM | POA: Diagnosis not present

## 2022-01-08 DIAGNOSIS — I10 Essential (primary) hypertension: Secondary | ICD-10-CM | POA: Diagnosis not present

## 2022-01-08 LAB — BASIC METABOLIC PANEL
Anion gap: 7 (ref 5–15)
BUN: 59 mg/dL — ABNORMAL HIGH (ref 8–23)
CO2: 30 mmol/L (ref 22–32)
Calcium: 9 mg/dL (ref 8.9–10.3)
Chloride: 99 mmol/L (ref 98–111)
Creatinine, Ser: 5.33 mg/dL — ABNORMAL HIGH (ref 0.61–1.24)
GFR, Estimated: 11 mL/min — ABNORMAL LOW (ref >60)
Glucose, Bld: 91 mg/dL (ref 70–99)
Potassium: 4.7 mmol/L (ref 3.5–5.1)
Sodium: 136 mmol/L (ref 135–145)

## 2022-01-08 LAB — CBC
HCT: 23.5 % — ABNORMAL LOW (ref 39.0–52.0)
Hemoglobin: 7.8 g/dL — ABNORMAL LOW (ref 13.0–17.0)
MCH: 33.5 pg (ref 26.0–34.0)
MCHC: 33.2 g/dL (ref 30.0–36.0)
MCV: 100.9 fL — ABNORMAL HIGH (ref 80.0–100.0)
Platelets: 194 10*3/uL (ref 150–400)
RBC: 2.33 MIL/uL — ABNORMAL LOW (ref 4.22–5.81)
RDW: 15.9 % — ABNORMAL HIGH (ref 11.5–15.5)
WBC: 5.6 10*3/uL (ref 4.0–10.5)
nRBC: 0.4 % — ABNORMAL HIGH (ref 0.0–0.2)

## 2022-01-08 NOTE — Progress Notes (Signed)
Occupational Therapy Treatment Patient Details Name: Todd Brown MRN: [id number] DOB: 1960/10/28 Today's Date: 01/08/2022   History of present illness 61 year old male with past medical history of hypertension and end-stage renal disease on hemodialysis presented to the emergency room from his group home/skilled nursing facility on 7/17 with confusion.  (Patient's baseline is that he is unable to care for himself and has a legal guardian.)  Patient last received dialysis on Friday, 7/14.  From being in the emergency room on 7/17, unable to get dialysis yesterday.  Patient found to have markedly elevated blood pressure with a systolic in the 431V.  Patient with questionable cardiac ischemia as well   OT comments  Chart reviewed, co tx completed on this date. Pt appears more irritable than previous tx sessions stating " I am not getting up and walking" "I am not exercising today". Pt requires CGA for STS, spo2 down to 82% in standing on RA, recovered to above 90% on 2L via [place] with seated rest break. MAX A required for peri care following small BM in chair. SET Up required for feeding tasks. Pt requires frequent vcs for sequencing and safety throughout all mobility/ADL due to vision deficits and cognition. Per report and chart review, pt completes SPT to mwc. Pt appears to be approaching baseline, would recommend discharge to STR at this time however could return home with [place] supervision-CGA for SPT transfers and assist for all ADLs. Pt is left in bedside chair, NAD, all needs met. OT will follow acutely.    Recommendations for follow up therapy are one component of a multi-disciplinary discharge planning process, led by the attending physician.  Recommendations may be updated based on patient status, additional functional criteria and insurance authorization.    Follow Up Recommendations  Skilled nursing-short term rehab (<3 hours/day)    Assistance Recommended at Discharge Frequent or constant  Supervision/Assistance  Patient can return home with the following  A lot of help with bathing/dressing/bathroom;A lot of help with walking and/or transfers;Assistance with feeding;Assist for transportation;Direct supervision/assist for medications management;Direct supervision/assist for financial management;Assistance with cooking/housework;Help with stairs or ramp for entrance   Equipment Recommendations  Other (comment) (per next venue of care)    Recommendations for Other Services      Precautions / Restrictions Precautions Precautions: Fall Precaution Comments: vision impaired Restrictions Weight Bearing Restrictions: No       Mobility Bed Mobility               General bed mobility comments: NT pt in recliner pre/post session    Transfers Overall transfer level: Needs assistance Equipment used: Rolling walker (2 wheels) Transfers: Sit to/from Stand Sit to Stand: Min guard           General transfer comment: frequent vcs for safety     Balance Overall balance assessment: Needs assistance Sitting-balance support: Feet supported Sitting balance-[name]: Fair   Postural control: Right lateral lean Standing balance support: Bilateral upper extremity supported Standing balance-[name]: Fair                             ADL either performed or assessed with clinical judgement   ADL Overall ADL's : Needs assistance/impaired     Grooming: Sitting;Set up;Wash/dry face                       Toileting- Clothing Manipulation and Hygiene: Sit to/from stand;Maximal assistance  Extremity/Trunk Assessment              Vision       Perception     Praxis      Cognition Arousal/Alertness: Awake/alert Behavior During Therapy: Agitated Overall Cognitive Status: No family/caregiver present to determine baseline cognitive functioning Area of Impairment: Orientation, Attention, Following commands,  Safety/judgement, Awareness, Problem solving                 Orientation Level: Disoriented to, Situation Current Attention Level: Sustained   Following Commands: Follows one step commands with increased time Safety/Judgement: Decreased awareness of safety, Decreased awareness of deficits Awareness: Intellectual Problem Solving: Slow processing, Decreased initiation, Difficulty sequencing, Requires verbal cues, Requires tactile cues          Exercises      Shoulder Instructions       General Comments spo2 82% on RA while in standing, >90% with seated rest break and 2 L via [place]; Pt on 2 L via [place] at start of session with spo2 95%    Pertinent Vitals/ Pain       Pain Assessment Pain Assessment: No/denies pain  Home Living                                          Prior Functioning/Environment              Frequency  Min 2X/week        Progress Toward Goals  OT Goals(current goals can now be found in the care plan section)  Progress towards OT goals: Progressing toward goals     Plan Discharge plan remains appropriate;Frequency remains appropriate    Co-evaluation    PT/OT/SLP Co-Evaluation/Treatment: Yes Reason for Co-Treatment: Necessary to address cognition/behavior during functional activity PT goals addressed during session: Mobility/safety with mobility OT goals addressed during session: ADL's and self-care      AM-PAC OT "6 Clicks" Daily Activity     Outcome Measure   Help from another person eating meals?: None Help from another person taking care of personal grooming?: A Little Help from another person toileting, which includes using toliet, bedpan, or urinal?: A Little Help from another person bathing (including washing, rinsing, drying)?: A Lot Help from another person to put on and taking off regular upper body clothing?: A Little Help from another person to put on and taking off regular lower body clothing?: A Lot 6  Click Score: 17    End of Session Equipment Utilized During Treatment: Rolling walker (2 wheels);Oxygen  OT Visit Diagnosis: Unsteadiness on feet (R26.81);Muscle weakness (generalized) (M62.81)   Activity Tolerance Patient tolerated treatment well   Patient Left in chair;with call bell/phone within reach;with chair alarm set   Nurse Communication Mobility status        Time: 1046-1110 OT Time Calculation (min): 24 min  Charges: OT General Charges $OT Visit: 1 Visit OT Treatments $Therapeutic Activity: 8-22 mins  Shanon Payor, OTD OTR/L  01/08/22, 1:48 PM

## 2022-01-08 NOTE — Progress Notes (Signed)
PROGRESS NOTE    Todd Brown  LOV:[id number] DOB: April 30, 1961 DOA: 12/15/2021 PCP: Jodi Marble, MD  Assessment & Plan:   Active Problems:   ESRD on dialysis [facility])   Mental health disorder   Essential hypertension   Physical deconditioning   Malnutrition of moderate degree  Assessment and Plan: Acute metabolic encephalopathy: resolved. Mental status is back to baseline   Acute hypoxic respiratory failure: SaO2 dropped to 82% w/ standing on RA as per PT. Continue on supplemental oxygen and wean as tolerated. Encourage incentive spirometry   Hyperkalemia: WNL today    Hypertensive emergency: resolved    ESRD: on HD MWF. Nephro recs apprec   ACD: likely secondary to ESRD. Will transfuse if Hb < 7.0    Mental health disorder: unspecified. Unable to care for himself. Continue on home dose of trihexyphenidyl, tegretol    HTN: continue on clonidine, irbesartan, imdur, minoxidil, doxazosin, amlodipine & reduced dose of hydralazine    Oral thrush: resolved as of 01/02/2022   Subcutaneous emphysema: resolved as of 12/18/2021. Completed steroid course   Physical deconditioning: PT/OT recs SNF    Moderate malnutrition: continue w/ nutritional supplements      DVT prophylaxis: heparin  Code Status: full  Family Communication:  Disposition Plan: possibly d/c to SNF vs back to group home  Level of care: Med-Surg  Status is: Inpatient Remains inpatient appropriate because: waiting to see if group home will take pt back    Consultants:  Nephro   Procedures:   Antimicrobials:   Subjective: Pt c/o malaise   Objective: Vitals:   01/07/22 1241 01/07/22 1541 01/07/22 1955 01/08/22 0418  BP: (!) 113/57 137/73 (!) 147/69 (!) 150/79  Pulse: 77 81 91 77  Resp: 18 16 20 19   Temp: 98.2 F (36.8 C) 98.1 F (36.7 C) 98 F (36.7 C) 98.2 F (36.8 C)  TempSrc: Oral  Oral   SpO2: 96% 94% (!) 83% 95%  Weight:      Height:        Intake/Output Summary (Last 24  hours) at 01/08/2022 0729 Last data filed at 01/07/2022 1218 Gross per 24 hour  Intake 240 ml  Output 1500 ml  Net -1260 ml   Filed Weights   01/05/22 1424 01/07/22 0814 01/07/22 1218  Weight: 84.3 kg 87.3 kg 85.8 kg    Examination:  General exam: Appears comfortable   Respiratory system: diminished breath sounds b/l  Cardiovascular system: S1/S2+. No clicks or rubs. 1+ pitting edema b/l LE  Gastrointestinal system: Abd is soft, NT, ND & normal bowel sounds  Central nervous system: Alert and awake. Moves all extremities  Psychiatry: judgement and insight appears at baseline. Flat mood and affect     Data Reviewed: I have personally reviewed following labs and imaging studies  CBC: Recent Labs  Lab 01/02/22 0508 01/05/22 0959 01/07/22 0734 01/08/22 0547  WBC 6.2 5.1 4.4 5.6  NEUTROABS  --  3.9 2.9  --   HGB 7.8* 8.2* 7.3* 7.8*  HCT 22.4* 23.9* 21.6* 23.5*  MCV 96.6 98.4 98.6 100.9*  PLT 192 215 211 884   Basic Metabolic Panel: Recent Labs  Lab 01/02/22 0508 01/05/22 0959 01/07/22 0734 01/08/22 0547  NA 135 133* 133* 136  K 4.4 5.3* 5.6* 4.7  CL 98 93* 96* 99  CO2 30 26 28 30   GLUCOSE 74 107* 80 91  BUN 41* 70* 73* 59*  CREATININE 5.26* 6.03* 6.41* 5.33*  CALCIUM 9.3 9.8 9.3 9.0  PHOS 2.3*  2.0* 3.0  --    GFR: Estimated Creatinine Clearance: 16 mL/min (A) (by C-G formula based on SCr of 5.33 mg/dL (H)). Liver Function Tests: Recent Labs  Lab 01/02/22 0508 01/05/22 0959 01/07/22 0734  ALBUMIN 2.4* 3.5 2.9*   No results for input(s): "LIPASE", "AMYLASE" in the last 168 hours. No results for input(s): "AMMONIA" in the last 168 hours. Coagulation Profile: No results for input(s): "INR", "PROTIME" in the last 168 hours. Cardiac Enzymes: No results for input(s): "CKTOTAL", "CKMB", "CKMBINDEX", "TROPONINI" in the last 168 hours. BNP (last 3 results) No results for input(s): "PROBNP" in the last 8760 hours. HbA1C: No results for input(s): "HGBA1C" in the  last 72 hours. CBG: No results for input(s): "GLUCAP" in the last 168 hours. Lipid Profile: No results for input(s): "CHOL", "HDL", "LDLCALC", "TRIG", "CHOLHDL", "LDLDIRECT" in the last 72 hours. Thyroid Function Tests: No results for input(s): "TSH", "T4TOTAL", "FREET4", "T3FREE", "THYROIDAB" in the last 72 hours. Anemia Panel: No results for input(s): "VITAMINB12", "FOLATE", "FERRITIN", "TIBC", "IRON", "RETICCTPCT" in the last 72 hours. Sepsis Labs: No results for input(s): "PROCALCITON", "LATICACIDVEN" in the last 168 hours.  No results found for this or any previous visit (from the past 240 hour(s)).       Radiology Studies: No results found.      Scheduled Meds:  (feeding supplement) PROSource Plus  30 mL Oral TID BM   amLODipine  10 mg Oral q morning   calcium acetate  667 mg Oral TID WC   carbamazepine  1,000 mg Oral q AM   Chlorhexidine Gluconate Cloth  6 each Topical Daily   cloNIDine  0.2 mg Transdermal Weekly   doxazosin  4 mg Oral QHS   epoetin (EPOGEN/PROCRIT) injection  10,000 Units Intravenous Q M,W,F-HD   ferrous sulfate  325 mg Oral Q breakfast   fluPHENAZine  2.5 mg Oral q AM   heparin  5,000 Units Subcutaneous Q8H   hydrALAZINE  5 mg Oral TID   irbesartan  300 mg Oral Daily   isosorbide mononitrate  60 mg Oral BID   magic mouthwash  10 mL Oral QID   minoxidil  5 mg Oral Daily   mometasone-formoterol  2 puff Inhalation BID   multivitamin  1 tablet Oral QHS   pantoprazole  40 mg Oral Daily   polyethylene glycol  17 g Oral BID   trihexyphenidyl  2 mg Oral BID WC   Continuous Infusions:  anticoagulant sodium citrate       LOS: 24 days    Time spent: 25 mins     Wyvonnia Dusky, MD [facility] Pager [phone]  If 7PM-7AM, please contact night-coverage [email or web address] 01/08/2022, 7:29 AM

## 2022-01-08 NOTE — NC FL2 (Signed)
[place] LEVEL OF CARE SCREENING TOOL     IDENTIFICATION  Patient Name: Todd Brown Birthdate: May 31, 1961 Sex: male Admission Date (Current Location): 12/15/2021  [place] and [place] Number:  [name] [id number] [place] and Address:  [facility], [address] [place], [place] [address]      Provider Number: [id number]  Attending Physician Name and Address:  Wyvonnia Dusky, MD  Relative Name and Phone Number:  Carey Bullocks (Legal Guardian)   [phone] (Mobile)    Current Level of Care: Hospital Recommended Level of Care: [place] Prior Approval Number:    Date Approved/Denied:   PASRR Number: [id number]  Discharge Plan: Other (Comment) (Assisted Living)    Current Diagnoses: Patient Active Problem List   Diagnosis Date Noted   Malnutrition of moderate degree 12/24/2021   Physical deconditioning 12/20/2021   Mental health disorder 12/19/2021   Essential hypertension 12/15/2021   ESRD on dialysis ([place]) 12/15/2021    Orientation RESPIRATION BLADDER Height & Weight     Self  Normal  (Dialysis) Weight: 85.8 kg Height:  6' (182.9 cm)  BEHAVIORAL SYMPTOMS/MOOD NEUROLOGICAL BOWEL NUTRITION STATUS      Continent Diet (DYS 3)  AMBULATORY STATUS COMMUNICATION OF NEEDS Skin    (needs use of 2 wheeled walker) Verbally Normal                       Personal Care Assistance Level of Assistance  Bathing, Feeding, Dressing Bathing Assistance: Limited assistance (Maximum assistance with lower body) Feeding assistance: Limited assistance Dressing Assistance: Limited assistance (Maximum assistance with lower body) Total Care Assistance: Maximum assistance   Functional Limitations Info  Sight, Hearing, Speech Sight Info: Impaired Hearing Info: Impaired Speech Info: Impaired (Slurred/dysarthria)    SPECIAL CARE FACTORS FREQUENCY  PT (By licensed PT), OT (By licensed OT)     PT Frequency: 5 times  a week OT Frequency: 5 times a week            Contractures Contractures Info: Not present    Additional Factors Info  Allergies, Code Status Code Status Info: FULL CODE Allergies Info: No Known Allergies           Current Medications (01/08/2022):  This is the current hospital active medication list Current Facility-Administered Medications  Medication Dose Route Frequency Provider Last Rate Last Admin   (feeding supplement) PROSource Plus liquid 30 mL  30 mL Oral TID BM Amin, Soundra Pilon, MD   30 mL at 01/08/22 0906   albuterol (PROVENTIL) (2.5 MG/3ML) 0.083% nebulizer solution 3 mL  3 mL Inhalation Q4H PRN Annita Brod, MD       alteplase (CATHFLO ACTIVASE) injection 2 mg  2 mg Intracatheter Once PRN Annita Brod, MD       amLODipine (NORVASC) tablet 10 mg  10 mg Oral q morning Annita Brod, MD   10 mg at 01/08/22 2595   anticoagulant sodium citrate solution 5 mL  5 mL Intracatheter PRN Annita Brod, MD       calcium acetate (PHOSLO) capsule 667 mg  667 mg Oral TID WC Murlean Iba, MD   667 mg at 01/08/22 0858   carbamazepine (TEGRETOL) tablet 1,000 mg  1,000 mg Oral q AM Annita Brod, MD   1,000 mg at 01/08/22 1007   Chlorhexidine Gluconate Cloth 2 % PADS 6 each  6 each Topical Daily Lorella Nimrod, MD   6 each at 01/08/22 0911   cloNIDine (CATAPRES -  Dosed in mg/24 hr) patch 0.2 mg  0.2 mg Transdermal Weekly Breeze, Benancio Deeds, NP   0.2 mg at 01/05/22 0947   diphenhydrAMINE (BENADRYL) injection 50 mg  50 mg Intravenous Q6H PRN Lorella Nimrod, MD   50 mg at 01/04/22 0028   doxazosin (CARDURA) tablet 4 mg  4 mg Oral QHS Murlean Iba, MD   4 mg at 01/07/22 2133   epoetin alfa (EPOGEN) injection 10,000 Units  10,000 Units Intravenous Q M,W,F-HD Colon Flattery, NP   10,000 Units at 01/07/22 1112   ferrous sulfate tablet 325 mg  325 mg Oral Q breakfast Annita Brod, MD   325 mg at 01/08/22 0858   fluPHENAZine (PROLIXIN) tablet 2.5 mg  2.5 mg Oral q  AM Annita Brod, MD   2.5 mg at 01/08/22 0350   haloperidol lactate (HALDOL) injection 2 mg  2 mg Intravenous Daily PRN Lorella Nimrod, MD       heparin injection 1,000 Units  1,000 Units Intracatheter PRN Annita Brod, MD       heparin injection 5,000 Units  5,000 Units Subcutaneous Q8H Annita Brod, MD   5,000 Units at 01/08/22 0526   hydrALAZINE (APRESOLINE) injection 20 mg  20 mg Intravenous Q6H PRN Sharion Settler, NP   20 mg at 12/28/21 0559   hydrALAZINE (APRESOLINE) tablet 5 mg  5 mg Oral TID Annita Brod, MD   5 mg at 01/08/22 0938   irbesartan (AVAPRO) tablet 300 mg  300 mg Oral Daily Annita Brod, MD   300 mg at 01/08/22 0908   isosorbide mononitrate (IMDUR) 24 hr tablet 60 mg  60 mg Oral BID Annita Brod, MD   60 mg at 01/08/22 0908   lidocaine (PF) (XYLOCAINE) 1 % injection 5 mL  5 mL Intradermal PRN Annita Brod, MD       lidocaine-prilocaine (EMLA) cream 1 Application  1 Application Topical PRN Annita Brod, MD       magic mouthwash  10 mL Oral QID Lorella Nimrod, MD   10 mL at 01/08/22 0910   minoxidil (LONITEN) tablet 5 mg  5 mg Oral Daily Annita Brod, MD   5 mg at 01/08/22 0909   mometasone-formoterol (DULERA) 200-5 MCG/ACT inhaler 2 puff  2 puff Inhalation BID Annita Brod, MD   2 puff at 01/08/22 0858   multivitamin (RENA-VIT) tablet 1 tablet  1 tablet Oral QHS Lorella Nimrod, MD   1 tablet at 01/07/22 2133   ondansetron (ZOFRAN) tablet 4 mg  4 mg Oral Q6H PRN Annita Brod, MD       Or   ondansetron [facility]) injection 4 mg  4 mg Intravenous Q6H PRN Annita Brod, MD       Oral care mouth rinse  15 mL Mouth Rinse PRN Annita Brod, MD       pantoprazole (PROTONIX) EC tablet 40 mg  40 mg Oral Daily Annita Brod, MD   40 mg at 01/08/22 0908   pentafluoroprop-tetrafluoroeth (GEBAUERS) aerosol 1 Application  1 Application Topical PRN Annita Brod, MD       polyethylene glycol (MIRALAX / GLYCOLAX)  packet 17 g  17 g Oral BID PRN Annita Brod, MD   17 g at 12/26/21 1734   polyethylene glycol (MIRALAX / GLYCOLAX) packet 17 g  17 g Oral BID Lorella Nimrod, MD   17 g at 01/06/22 2208   senna-docusate (Senokot-S) tablet 1 tablet  1  tablet Oral QHS PRN Annita Brod, MD   1 tablet at 12/25/21 2251   trihexyphenidyl (ARTANE) tablet 2 mg  2 mg Oral BID WC Annita Brod, MD   2 mg at 01/08/22 6184     Discharge Medications: Please see discharge summary for a list of discharge medications.  Relevant Imaging Results:  Relevant Lab Results:   Additional Information [id number]  Pete Pelt, RN

## 2022-01-08 NOTE — TOC Progression Note (Signed)
Transition of Care [facility]) - Progression Note    Patient Details  Name: Todd Brown MRN: [id number] Date of Birth: 06-Feb-1961  [facility]) CM/SW [place], RN Phone Number: 01/08/2022, 10:00 AM  Clinical Narrative:   As per therapy, patient has Home Health recommendations.  Patient is a resident of [name].  [name] states they will need to assess patient prior to accepting for return.  They will notify RNCM upon assessment today.  TOC to follow.    Expected Discharge Plan: [place] Barriers to Discharge: Continued Medical Work up  Expected Discharge Plan and Services Expected Discharge Plan: [place] arrangements for the past 2 months: [place] Expected Discharge Date: 12/19/21                                     Social Determinants of Health (SDOH) Interventions    Readmission Risk Interventions     No data to display

## 2022-01-08 NOTE — Progress Notes (Signed)
Physical Therapy Treatment Patient Details Name: Todd Brown MRN: [id number] DOB: 1960-06-17 Today's Date: 01/08/2022   History of Present Illness 61 year old male with past medical history of hypertension and end-stage renal disease on hemodialysis presented to the emergency room from his group home/skilled nursing facility on 7/17 with confusion.  (Patient's baseline is that he is unable to care for himself and has a legal guardian.)  Patient last received dialysis on Friday, 7/14.  From being in the emergency room on 7/17, unable to get dialysis yesterday.  Patient found to have markedly elevated blood pressure with a systolic in the 376E.  Patient with questionable cardiac ischemia as well    PT Comments    Patient seen with OT in attempts to progress transfers and ambulation. The patient refused to attempt walking today and reports he does not walk at baseline. He can stand from the recliner with no physical assistance, but cues required intermittently due to vision impairments. Supplemental 02 removed during mobility and Sp02 decreased to 82% while standing with room air. Sp02 increased to 90% with 2 L02 after several minutes with seated rest break and cues for breathing techniques. The patient is likely near his baseline level of functional mobility as he was only performing stand pivot transfers at baseline. He could likely return to previous facility with intermittent supervision/assistance. SNF will be required otherwise.    Recommendations for follow up therapy are one component of a multi-disciplinary discharge planning process, led by the attending physician.  Recommendations may be updated based on patient status, additional functional criteria and insurance authorization.  Follow Up Recommendations  Skilled nursing-short term rehab (<3 hours/day) (ALF with adequate support or will need SNF) Can patient physically be transported by private vehicle: No   Assistance Recommended at  Discharge Intermittent Supervision/Assistance  Patient can return home with the following A little help with walking and/or transfers;A little help with bathing/dressing/bathroom;Help with stairs or ramp for entrance;Assist for transportation;Direct supervision/assist for medications management   Equipment Recommendations  Rolling walker (2 wheels);BSC/3in1;Wheelchair cushion (measurements PT);Wheelchair (measurements PT)    Recommendations for Other Services       Precautions / Restrictions Precautions Precautions: Fall Restrictions Weight Bearing Restrictions: No     Mobility  Bed Mobility               General bed mobility comments: not addressed this session    Transfers Overall transfer level: Needs assistance Equipment used: Rolling walker (2 wheels) Transfers: Sit to/from Stand Sit to Stand: Min guard           General transfer comment: Min guard for safety. verbal cues for technique and task initiation    Ambulation/Gait             Pre-gait activities: weight shifting to left and right without loss of balance. standing tolerance around 1 minute General Gait Details: encourgaed patient to attempt walking with assistance, however he was irritable and refused to attempt. he reports that he was not ambulatory at the facility prior to admission   Stairs             Wheelchair Mobility    Modified Rankin (Stroke Patients Only)       Balance Overall balance assessment: Needs assistance Sitting-balance support: Feet supported Sitting balance-[name]: Fair     Standing balance support: Bilateral upper extremity supported Standing balance-[name]: Fair Standing balance comment: no loss of balance.  Cognition Arousal/Alertness: Awake/alert Behavior During Therapy: Agitated (mild agitation, irritable) Overall Cognitive Status: No family/caregiver present to determine baseline cognitive  functioning                         Following Commands: Follows one step commands with increased time Safety/Judgement: Decreased awareness of safety, Decreased awareness of deficits   Problem Solving: Slow processing, Decreased initiation, Difficulty sequencing, Requires verbal cues, Requires tactile cues General Comments: patient needs safety cues with mobility partially due to impaired vision        Exercises      General Comments General comments (skin integrity, edema, etc.): Sp02 was 82% on room air in standing. Sp02 increased to 90% after several minutes with seated rest break and replacement of 2 L02.      Pertinent Vitals/Pain Pain Assessment Pain Assessment: No/denies pain    Home Living                          Prior Function            PT Goals (current goals can now be found in the care plan section) Acute Rehab PT Goals Patient Stated Goal: to go back home PT Goal Formulation: With patient Time For Goal Achievement: 01/22/22 Potential to Achieve Goals: Fair Progress towards PT goals: Progressing toward goals    Frequency    Min 2X/week      PT Plan Current plan remains appropriate (goals have been updated and care plan extended +2 weeks)    Co-evaluation   Reason for Co-Treatment: To address functional/ADL transfers;For patient/therapist safety (to progress functional mobility) PT goals addressed during session: Mobility/safety with mobility        AM-PAC PT "6 Clicks" Mobility   Outcome Measure  Help needed turning from your back to your side while in a flat bed without using bedrails?: A Little Help needed moving from lying on your back to sitting on the side of a flat bed without using bedrails?: A Little Help needed moving to and from a bed to a chair (including a wheelchair)?: A Little Help needed standing up from a chair using your arms (e.g., wheelchair or bedside chair)?: A Little Help needed to walk in hospital  room?: A Little Help needed climbing 3-5 steps with a railing? : Total 6 Click Score: 16    End of Session   Activity Tolerance: Patient tolerated treatment well Patient left: in chair;with call bell/phone within reach Nurse Communication: Mobility status (Sp02, 02) PT Visit Diagnosis: Other abnormalities of gait and mobility (R26.89);Muscle weakness (generalized) (M62.81);Unsteadiness on feet (R26.81)     Time: 8099-8338 PT Time Calculation (min) (ACUTE ONLY): 26 min  Charges:  $Therapeutic Activity: 8-22 mins                     [name], PT, MPT    [name] 01/08/2022, 12:59 PM

## 2022-01-08 NOTE — Progress Notes (Addendum)
[facility]  ROUNDING NOTE   Subjective:   Todd Brown is a 61 year old male with past medical history including hypertension, anemia, schizophrenia and end-stage renal disease on hemodialysis.  Patient has presented to the ED with altered mental status.  Patient has been admitted for Altered mental status [R41.82] Hypertensive urgency [I16.0] Severe hypertension [I10] Altered mental status, unspecified altered mental status type [R41.82]  Patient is known to our practice and receives outpatient dialysis treatments at [facility] on a MWF schedule.  Update: Patient seen sitting up in chair, eating breakfast Alert Denies pain Room air   Objective:  Vital signs in last 24 hours:  Temp:  [98 F (36.7 C)-98.4 F (36.9 C)] 98.4 F (36.9 C) (08/10 0731) Pulse Rate:  [70-91] 70 (08/10 0731) Resp:  [16-20] 16 (08/10 0731) BP: (137-158)/(69-84) 158/84 (08/10 0731) SpO2:  [83 %-97 %] 97 % (08/10 0731)  Weight change:  Filed Weights   01/05/22 1424 01/07/22 0814 01/07/22 1218  Weight: 84.3 kg 87.3 kg 85.8 kg    Intake/Output: I/O last 3 completed shifts: In: 240 [P.O.:240] Out: 1500 [Other:1500]   Intake/Output this shift:  No intake/output data recorded.  Physical Exam: General: NAD  Head: Normocephalic, atraumatic. Moist oral mucosal membranes  Eyes: Anicteric  Lungs:  Clear to auscultation, normal effort, room air  Heart: Regular rate and rhythm  Abdomen:  Soft, nontender, non distended  Extremities:  trace peripheral edema.  Neurologic: Alert, answer simple questions  Skin: No lesions  Access: Lt AVF     Basic Metabolic Panel: Recent Labs  Lab 01/02/22 0508 01/05/22 0959 01/07/22 0734 01/08/22 0547  NA 135 133* 133* 136  K 4.4 5.3* 5.6* 4.7  CL 98 93* 96* 99  CO2 30 26 28 30   GLUCOSE 74 107* 80 91  BUN 41* 70* 73* 59*  CREATININE 5.26* 6.03* 6.41* 5.33*  CALCIUM 9.3 9.8 9.3 9.0  PHOS 2.3* 2.0* 3.0  --      Liver Function  Tests: Recent Labs  Lab 01/02/22 0508 01/05/22 0959 01/07/22 0734  ALBUMIN 2.4* 3.5 2.9*    No results for input(s): "LIPASE", "AMYLASE" in the last 168 hours. No results for input(s): "AMMONIA" in the last 168 hours.  CBC: Recent Labs  Lab 01/02/22 0508 01/05/22 0959 01/07/22 0734 01/08/22 0547  WBC 6.2 5.1 4.4 5.6  NEUTROABS  --  3.9 2.9  --   HGB 7.8* 8.2* 7.3* 7.8*  HCT 22.4* 23.9* 21.6* 23.5*  MCV 96.6 98.4 98.6 100.9*  PLT 192 215 211 194     Cardiac Enzymes: No results for input(s): "CKTOTAL", "CKMB", "CKMBINDEX", "TROPONINI" in the last 168 hours.  BNP: Invalid input(s): "POCBNP"  CBG: No results for input(s): "GLUCAP" in the last 168 hours.   Microbiology: Results for orders placed or performed during the hospital encounter of 12/15/21  SARS Coronavirus 2 by RT PCR (hospital order, performed in [facility] hospital lab) *cepheid single result test* Anterior Nasal Swab     Status: None   Collection Time: 12/15/21  5:50 PM   Specimen: Anterior Nasal Swab  Result Value Ref Range Status   SARS Coronavirus 2 by RT PCR NEGATIVE NEGATIVE Final    Comment: (NOTE) SARS-CoV-2 target nucleic acids are NOT DETECTED.  The SARS-CoV-2 RNA is generally detectable in upper and lower respiratory specimens during the acute phase of infection. The lowest concentration of SARS-CoV-2 viral copies this assay can detect is 250 copies / mL. A negative result does not preclude SARS-CoV-2  infection and should not be used as the sole basis for treatment or other patient management decisions.  A negative result may occur with improper specimen collection / handling, submission of specimen other than nasopharyngeal swab, presence of viral mutation(s) within the areas targeted by this assay, and inadequate number of viral copies (<250 copies / mL). A negative result must be combined with clinical observations, patient history, and epidemiological information.  Fact Sheet for  Patients:   https://www.patel.info/  Fact Sheet for Healthcare Providers: https://hall.com/  This test is not yet approved or  cleared by the [place] FDA and has been authorized for detection and/or diagnosis of SARS-CoV-2 by FDA under an Emergency Use Authorization (EUA).  This EUA will remain in effect (meaning this test can be used) for the duration of the COVID-19 declaration under Section 564(b)(1) of the Act, 21 U.S.C. section 360bbb-3(b)(1), unless the authorization is terminated or revoked sooner.  Performed at [facility], [address] [place], [place] [address]   MRSA Next Gen by PCR, Nasal     Status: None   Collection Time: 12/15/21  9:03 PM   Specimen: Nasal Mucosa; Nasal Swab  Result Value Ref Range Status   MRSA by PCR Next Gen NOT DETECTED NOT DETECTED Final    Comment: (NOTE) The GeneXpert MRSA Assay (FDA approved for NASAL specimens only), is one component of a comprehensive MRSA colonization surveillance program. It is not intended to diagnose MRSA infection nor to guide or monitor treatment for MRSA infections. Test performance is not FDA approved in patients less than 22 years old. Performed at [facility], [address] [place], [place] [address]     Coagulation Studies: No results for input(s): "LABPROT", "INR" in the last 72 hours.  Urinalysis: No results for input(s): "COLORURINE", "LABSPEC", "PHURINE", "GLUCOSEU", "HGBUR", "BILIRUBINUR", "KETONESUR", "PROTEINUR", "UROBILINOGEN", "NITRITE", "LEUKOCYTESUR" in the last 72 hours.  Invalid input(s): "APPERANCEUR"    Imaging: No results found.   Medications:    anticoagulant sodium citrate      (feeding supplement) PROSource Plus  30 mL Oral TID BM   amLODipine  10 mg Oral q morning   calcium acetate  667 mg Oral TID WC   carbamazepine  1,000 mg Oral q AM   Chlorhexidine Gluconate Cloth  6 each Topical Daily    cloNIDine  0.2 mg Transdermal Weekly   doxazosin  4 mg Oral QHS   epoetin (EPOGEN/PROCRIT) injection  10,000 Units Intravenous Q M,W,F-HD   ferrous sulfate  325 mg Oral Q breakfast   fluPHENAZine  2.5 mg Oral q AM   heparin  5,000 Units Subcutaneous Q8H   hydrALAZINE  5 mg Oral TID   irbesartan  300 mg Oral Daily   isosorbide mononitrate  60 mg Oral BID   magic mouthwash  10 mL Oral QID   minoxidil  5 mg Oral Daily   mometasone-formoterol  2 puff Inhalation BID   multivitamin  1 tablet Oral QHS   pantoprazole  40 mg Oral Daily   polyethylene glycol  17 g Oral BID   trihexyphenidyl  2 mg Oral BID WC   albuterol, alteplase, anticoagulant sodium citrate, diphenhydrAMINE, haloperidol lactate, heparin, hydrALAZINE, lidocaine (PF), lidocaine-prilocaine, ondansetron **OR** ondansetron (ZOFRAN) IV, mouth rinse, pentafluoroprop-tetrafluoroeth, polyethylene glycol, senna-docusate  Assessment/ Plan:  [name] is a 61 y.o.  male with past medical history including hypertension, anemia, schizophrenia and end-stage renal disease on hemodialysis.  Patient has presented to the ED with altered mental status.  Patient has been admitted for Altered mental status [R41.82] Hypertensive urgency [I16.0] Severe hypertension [I10] Altered mental status, unspecified altered mental status type [R41.82]  [facility] [place]/MWF/Lt AVF  End-stage renal disease on hemodialysis.  Will maintain outpatient schedule if possible.   Dialysis received yesterday, UF 1.5L achieved. Next treatment scheduled for Friday. Patient cleared to discharge from renal stance.      2. Anemia of chronic kidney disease Lab Results  Component Value Date   HGB 7.8 (L) 01/08/2022    EPO received with dialysis treatments.    3. Secondary Hyperparathyroidism:  Lab Results  Component Value Date   CALCIUM 9.0 01/08/2022   PHOS 3.0 01/07/2022   Will continue to monitor   4.  Hypertension with chronic kidney disease.   Home regimen includes amlodipine, clonidine, hydralazine, isosorbide, and losartan.    Currently on amlodipine 10 mg daily, clonidine patch 0.2 mg per 24 hours, Cardura 4 mg daily at bedtime, hydralazine 10 mg 3 times per day, Avapro 300 mg daily, isosorbide 60 mg twice a day, minoxidil 5 mg daily.  Blood pressure stable 158/84    LOS: 24   8/10/20231:11 PM

## 2022-01-09 DIAGNOSIS — I10 Essential (primary) hypertension: Secondary | ICD-10-CM | POA: Diagnosis not present

## 2022-01-09 DIAGNOSIS — R5381 Other malaise: Secondary | ICD-10-CM | POA: Diagnosis not present

## 2022-01-09 DIAGNOSIS — Z992 Dependence on renal dialysis: Secondary | ICD-10-CM | POA: Diagnosis not present

## 2022-01-09 DIAGNOSIS — N186 End stage renal disease: Secondary | ICD-10-CM | POA: Diagnosis not present

## 2022-01-09 LAB — CBC
HCT: 22.2 % — ABNORMAL LOW (ref 39.0–52.0)
Hemoglobin: 7.4 g/dL — ABNORMAL LOW (ref 13.0–17.0)
MCH: 33.3 pg (ref 26.0–34.0)
MCHC: 33.3 g/dL (ref 30.0–36.0)
MCV: 100 fL (ref 80.0–100.0)
Platelets: 214 10*3/uL (ref 150–400)
RBC: 2.22 MIL/uL — ABNORMAL LOW (ref 4.22–5.81)
RDW: 16.1 % — ABNORMAL HIGH (ref 11.5–15.5)
WBC: 5.3 10*3/uL (ref 4.0–10.5)
nRBC: 0 % (ref 0.0–0.2)

## 2022-01-09 LAB — BASIC METABOLIC PANEL
Anion gap: 10 (ref 5–15)
BUN: 84 mg/dL — ABNORMAL HIGH (ref 8–23)
CO2: 29 mmol/L (ref 22–32)
Calcium: 9.2 mg/dL (ref 8.9–10.3)
Chloride: 96 mmol/L — ABNORMAL LOW (ref 98–111)
Creatinine, Ser: 6.9 mg/dL — ABNORMAL HIGH (ref 0.61–1.24)
GFR, Estimated: 8 mL/min — ABNORMAL LOW (ref >60)
Glucose, Bld: 85 mg/dL (ref 70–99)
Potassium: 5.2 mmol/L — ABNORMAL HIGH (ref 3.5–5.1)
Sodium: 135 mmol/L (ref 135–145)

## 2022-01-09 MED ORDER — EPOETIN ALFA 10000 UNIT/ML IJ SOLN
INTRAMUSCULAR | Status: AC
  Filled 2022-01-09: qty 1

## 2022-01-09 NOTE — Progress Notes (Signed)
Pt 3.5 hr HD complete w/ no complications. Start 0842 End 1230 1.5L UFR 82.8L BVP Post weight 85.7 kg bed Epogen 10000u w/ HD Report to primary RN Sites held 24mins each. Positive post HD for thrill and bruit.

## 2022-01-09 NOTE — Progress Notes (Signed)
Post HD RN assessment 

## 2022-01-09 NOTE — Progress Notes (Signed)
[facility]  ROUNDING NOTE   Subjective:   Todd Brown is a 61 year old male with past medical history including hypertension, anemia, schizophrenia and end-stage renal disease on hemodialysis.  Patient has presented to the ED with altered mental status.  Patient has been admitted for Altered mental status [R41.82] Hypertensive urgency [I16.0] Severe hypertension [I10] Altered mental status, unspecified altered mental status type [R41.82]  Patient is known to our practice and receives outpatient dialysis treatments at [facility] on a MWF schedule.  Update: Patient seen and evaluated during dialysis   HEMODIALYSIS FLOWSHEET:  Blood Flow Rate (mL/min): 400 mL/min Arterial Pressure (mmHg): -230 mmHg Venous Pressure (mmHg): 200 mmHg TMP (mmHg): 14 mmHg Ultrafiltration Rate (mL/min): 583 mL/min Dialysate Flow Rate (mL/min): 300 ml/min Dialysis Fluid Bolus: Normal Saline Bolus Amount (mL): 300 mL  No complaints at this time   Objective:  Vital signs in last 24 hours:  Temp:  [98.1 F (36.7 C)-98.8 F (37.1 C)] 98.1 F (36.7 C) (08/11 0824) Pulse Rate:  [67-84] 69 (08/11 1200) Resp:  [10-18] 11 (08/11 1200) BP: (107-175)/(70-87) 123/71 (08/11 1200) SpO2:  [90 %-100 %] 97 % (08/11 1200) Weight:  [86.7 kg] 86.7 kg (08/11 0824)  Weight change:  Filed Weights   01/07/22 0814 01/07/22 1218 01/09/22 0824  Weight: 87.3 kg 85.8 kg 86.7 kg    Intake/Output: No intake/output data recorded.   Intake/Output this shift:  No intake/output data recorded.  Physical Exam: General: NAD  Head: Normocephalic, atraumatic. Moist oral mucosal membranes  Eyes: Anicteric  Lungs:  Clear to auscultation, normal effort, room air  Heart: Regular rate and rhythm  Abdomen:  Soft, nontender, non distended  Extremities:  trace peripheral edema.  Neurologic: Alert, answer simple questions  Skin: No lesions  Access: Lt AVF     Basic Metabolic Panel: Recent Labs  Lab  01/05/22 0959 01/07/22 0734 01/08/22 0547 01/09/22 0746  NA 133* 133* 136 135  K 5.3* 5.6* 4.7 5.2*  CL 93* 96* 99 96*  CO2 26 28 30 29   GLUCOSE 107* 80 91 85  BUN 70* 73* 59* 84*  CREATININE 6.03* 6.41* 5.33* 6.90*  CALCIUM 9.8 9.3 9.0 9.2  PHOS 2.0* 3.0  --   --      Liver Function Tests: Recent Labs  Lab 01/05/22 0959 01/07/22 0734  ALBUMIN 3.5 2.9*    No results for input(s): "LIPASE", "AMYLASE" in the last 168 hours. No results for input(s): "AMMONIA" in the last 168 hours.  CBC: Recent Labs  Lab 01/05/22 0959 01/07/22 0734 01/08/22 0547 01/09/22 0746  WBC 5.1 4.4 5.6 5.3  NEUTROABS 3.9 2.9  --   --   HGB 8.2* 7.3* 7.8* 7.4*  HCT 23.9* 21.6* 23.5* 22.2*  MCV 98.4 98.6 100.9* 100.0  PLT 215 211 194 214     Cardiac Enzymes: No results for input(s): "CKTOTAL", "CKMB", "CKMBINDEX", "TROPONINI" in the last 168 hours.  BNP: Invalid input(s): "POCBNP"  CBG: No results for input(s): "GLUCAP" in the last 168 hours.   Microbiology: Results for orders placed or performed during the hospital encounter of 12/15/21  SARS Coronavirus 2 by RT PCR (hospital order, performed in [facility] hospital lab) *cepheid single result test* Anterior Nasal Swab     Status: None   Collection Time: 12/15/21  5:50 PM   Specimen: Anterior Nasal Swab  Result Value Ref Range Status   SARS Coronavirus 2 by RT PCR NEGATIVE NEGATIVE Final    Comment: (NOTE) SARS-CoV-2 target nucleic acids  are NOT DETECTED.  The SARS-CoV-2 RNA is generally detectable in upper and lower respiratory specimens during the acute phase of infection. The lowest concentration of SARS-CoV-2 viral copies this assay can detect is 250 copies / mL. A negative result does not preclude SARS-CoV-2 infection and should not be used as the sole basis for treatment or other patient management decisions.  A negative result may occur with improper specimen collection / handling, submission of specimen other than  nasopharyngeal swab, presence of viral mutation(s) within the areas targeted by this assay, and inadequate number of viral copies (<250 copies / mL). A negative result must be combined with clinical observations, patient history, and epidemiological information.  Fact Sheet for Patients:   https://www.patel.info/  Fact Sheet for Healthcare Providers: https://hall.com/  This test is not yet approved or  cleared by the [place] FDA and has been authorized for detection and/or diagnosis of SARS-CoV-2 by FDA under an Emergency Use Authorization (EUA).  This EUA will remain in effect (meaning this test can be used) for the duration of the COVID-19 declaration under Section 564(b)(1) of the Act, 21 U.S.C. section 360bbb-3(b)(1), unless the authorization is terminated or revoked sooner.  Performed at [facility], [address] [place], [place] [address]   MRSA Next Gen by PCR, Nasal     Status: None   Collection Time: 12/15/21  9:03 PM   Specimen: Nasal Mucosa; Nasal Swab  Result Value Ref Range Status   MRSA by PCR Next Gen NOT DETECTED NOT DETECTED Final    Comment: (NOTE) The GeneXpert MRSA Assay (FDA approved for NASAL specimens only), is one component of a comprehensive MRSA colonization surveillance program. It is not intended to diagnose MRSA infection nor to guide or monitor treatment for MRSA infections. Test performance is not FDA approved in patients less than 37 years old. Performed at [facility], [address] [place], [place] [address]     Coagulation Studies: No results for input(s): "LABPROT", "INR" in the last 72 hours.  Urinalysis: No results for input(s): "COLORURINE", "LABSPEC", "PHURINE", "GLUCOSEU", "HGBUR", "BILIRUBINUR", "KETONESUR", "PROTEINUR", "UROBILINOGEN", "NITRITE", "LEUKOCYTESUR" in the last 72 hours.  Invalid input(s): "APPERANCEUR"    Imaging: No results  found.   Medications:    anticoagulant sodium citrate      (feeding supplement) PROSource Plus  30 mL Oral TID BM   amLODipine  10 mg Oral q morning   calcium acetate  667 mg Oral TID WC   carbamazepine  1,000 mg Oral q AM   Chlorhexidine Gluconate Cloth  6 each Topical Daily   cloNIDine  0.2 mg Transdermal Weekly   doxazosin  4 mg Oral QHS   epoetin (EPOGEN/PROCRIT) injection  10,000 Units Intravenous Q M,W,F-HD   ferrous sulfate  325 mg Oral Q breakfast   fluPHENAZine  2.5 mg Oral q AM   heparin  5,000 Units Subcutaneous Q8H   hydrALAZINE  5 mg Oral TID   irbesartan  300 mg Oral Daily   isosorbide mononitrate  60 mg Oral BID   magic mouthwash  10 mL Oral QID   minoxidil  5 mg Oral Daily   mometasone-formoterol  2 puff Inhalation BID   multivitamin  1 tablet Oral QHS   pantoprazole  40 mg Oral Daily   polyethylene glycol  17 g Oral BID   trihexyphenidyl  2 mg Oral BID WC   albuterol, alteplase, anticoagulant sodium citrate, diphenhydrAMINE, haloperidol lactate, heparin, hydrALAZINE, lidocaine (PF), lidocaine-prilocaine, ondansetron **OR** ondansetron (ZOFRAN)  IV, mouth rinse, pentafluoroprop-tetrafluoroeth, polyethylene glycol, senna-docusate  Assessment/ Plan:  Todd Brown is a 61 y.o.  male with past medical history including hypertension, anemia, schizophrenia and end-stage renal disease on hemodialysis.  Patient has presented to the ED with altered mental status.  Patient has been admitted for Altered mental status [R41.82] Hypertensive urgency [I16.0] Severe hypertension [I10] Altered mental status, unspecified altered mental status type [R41.82]  [facility] DVA /MWF/Lt AVF  End-stage renal disease on hemodialysis.  Will maintain outpatient schedule if possible.   Receiving dialysis today, UF goal 1L as tolerated. Next treatment scheduled for Monday.     2. Anemia of chronic kidney disease Lab Results  Component Value Date   HGB 7.4 (L) 01/09/2022     Hgb remains below target. EPO received with dialysis treatments.    3. Secondary Hyperparathyroidism:  Lab Results  Component Value Date   CALCIUM 9.2 01/09/2022   PHOS 3.0 01/07/2022   Will continue to monitor   4.  Hypertension with chronic kidney disease.  Home regimen includes amlodipine, clonidine, hydralazine, isosorbide, and losartan.    Currently on amlodipine 10 mg daily, clonidine patch 0.2 mg per 24 hours, Cardura 4 mg daily at bedtime, hydralazine 10 mg 3 times per day, Avapro 300 mg daily, isosorbide 60 mg twice a day, minoxidil 5 mg daily.  Blood pressure 145/81 during idalysis    LOS: 25 [place] 8/11/202312:16 PM

## 2022-01-09 NOTE — Progress Notes (Signed)
PROGRESS NOTE    Todd Brown  RCV:[id number] DOB: January 17, 1961 DOA: 12/15/2021 PCP: Jodi Marble, MD  Assessment & Plan:   Active Problems:   ESRD on dialysis [facility])   Mental health disorder   Essential hypertension   Physical deconditioning   Malnutrition of moderate degree  Assessment and Plan: Acute metabolic encephalopathy: resolved. Mental status is back to baseline   Acute hypoxic respiratory failure: SaO2 dropped to 82% w/ standing on RA as per PT. Continue on supplemental oxygen and wean as tolerated. Encourage incentive spirometry   Hyperkalemia: needs HD   Hypertensive emergency: resolved    ESRD: on HD MWF. Nephro recs apprec  ACD: likely secondary to ESRD. Will transfuse if Hb < 7.0    Mental health disorder: unspecified. Unable to care for himself. Continue on home dose of tegretol, trihexyphenidyl   HTN: continue on reduced dose of hydralazine. Continue on amlodipine, doxazosin, minoxidil, irbesartan & clonidine     Oral thrush: resolved as of 01/02/2022   Subcutaneous emphysema: resolved as of 12/18/2021. Completed steroid course   Physical deconditioning: PT/OT recs SNF   Moderate malnutrition: continue w/ nutritional supplements     DVT prophylaxis: heparin  Code Status: full  Family Communication:  Disposition Plan: possibly d/c to SNF vs back to group home  Level of care: Med-Surg  Status is: Inpatient Remains inpatient appropriate because: waiting to see if group home will take pt back but pt's group home did not come to evaluate the pt yesterday. CM is looking into this    Consultants:  Nephro   Procedures:   Antimicrobials:   Subjective: Pt c/o being hungry    Objective: Vitals:   01/08/22 0731 01/08/22 1601 01/08/22 2038 01/09/22 0548  BP: (!) 158/84 107/70 119/77 (!) 160/83  Pulse: 70 70 72 69  Resp: 16 14 16 18   Temp: 98.4 F (36.9 C) 98.8 F (37.1 C) 98.8 F (37.1 C) 98.6 F (37 C)  TempSrc:   Oral   SpO2: 97%  97% 94% 100%  Weight:      Height:       No intake or output data in the 24 hours ending 01/09/22 0730  Filed Weights   01/05/22 1424 01/07/22 0814 01/07/22 1218  Weight: 84.3 kg 87.3 kg 85.8 kg    Examination:  General exam: Appears comfortable  Respiratory system: decreased breath sounds b/  Cardiovascular system: S1 & S2+. No rubs or clicks. 1+ pitting edema of b/l LE Gastrointestinal system: Abd is soft, NT, ND & normal bowel sounds  Central nervous system: alert and awake. Moves all extremities  Psychiatry: judgement and insight appear at baseline. Flat mood and affect    Data Reviewed: I have personally reviewed following labs and imaging studies  CBC: Recent Labs  Lab 01/05/22 0959 01/07/22 0734 01/08/22 0547  WBC 5.1 4.4 5.6  NEUTROABS 3.9 2.9  --   HGB 8.2* 7.3* 7.8*  HCT 23.9* 21.6* 23.5*  MCV 98.4 98.6 100.9*  PLT 215 211 102   Basic Metabolic Panel: Recent Labs  Lab 01/05/22 0959 01/07/22 0734 01/08/22 0547  NA 133* 133* 136  K 5.3* 5.6* 4.7  CL 93* 96* 99  CO2 26 28 30   GLUCOSE 107* 80 91  BUN 70* 73* 59*  CREATININE 6.03* 6.41* 5.33*  CALCIUM 9.8 9.3 9.0  PHOS 2.0* 3.0  --    GFR: Estimated Creatinine Clearance: 16 mL/min (A) (by C-G formula based on SCr of 5.33 mg/dL (H)). Liver Function Tests:  Recent Labs  Lab 01/05/22 0959 01/07/22 0734  ALBUMIN 3.5 2.9*   No results for input(s): "LIPASE", "AMYLASE" in the last 168 hours. No results for input(s): "AMMONIA" in the last 168 hours. Coagulation Profile: No results for input(s): "INR", "PROTIME" in the last 168 hours. Cardiac Enzymes: No results for input(s): "CKTOTAL", "CKMB", "CKMBINDEX", "TROPONINI" in the last 168 hours. BNP (last 3 results) No results for input(s): "PROBNP" in the last 8760 hours. HbA1C: No results for input(s): "HGBA1C" in the last 72 hours. CBG: No results for input(s): "GLUCAP" in the last 168 hours. Lipid Profile: No results for input(s): "CHOL", "HDL",  "LDLCALC", "TRIG", "CHOLHDL", "LDLDIRECT" in the last 72 hours. Thyroid Function Tests: No results for input(s): "TSH", "T4TOTAL", "FREET4", "T3FREE", "THYROIDAB" in the last 72 hours. Anemia Panel: No results for input(s): "VITAMINB12", "FOLATE", "FERRITIN", "TIBC", "IRON", "RETICCTPCT" in the last 72 hours. Sepsis Labs: No results for input(s): "PROCALCITON", "LATICACIDVEN" in the last 168 hours.  No results found for this or any previous visit (from the past 240 hour(s)).       Radiology Studies: No results found.      Scheduled Meds:  (feeding supplement) PROSource Plus  30 mL Oral TID BM   amLODipine  10 mg Oral q morning   calcium acetate  667 mg Oral TID WC   carbamazepine  1,000 mg Oral q AM   Chlorhexidine Gluconate Cloth  6 each Topical Daily   cloNIDine  0.2 mg Transdermal Weekly   doxazosin  4 mg Oral QHS   epoetin (EPOGEN/PROCRIT) injection  10,000 Units Intravenous Q M,W,F-HD   ferrous sulfate  325 mg Oral Q breakfast   fluPHENAZine  2.5 mg Oral q AM   heparin  5,000 Units Subcutaneous Q8H   hydrALAZINE  5 mg Oral TID   irbesartan  300 mg Oral Daily   isosorbide mononitrate  60 mg Oral BID   magic mouthwash  10 mL Oral QID   minoxidil  5 mg Oral Daily   mometasone-formoterol  2 puff Inhalation BID   multivitamin  1 tablet Oral QHS   pantoprazole  40 mg Oral Daily   polyethylene glycol  17 g Oral BID   trihexyphenidyl  2 mg Oral BID WC   Continuous Infusions:  anticoagulant sodium citrate       LOS: 25 days    Time spent: 25 mins     Wyvonnia Dusky, MD [facility] Pager [phone]  If 7PM-7AM, please contact night-coverage [email or web address] 01/09/2022, 7:30 AM

## 2022-01-09 NOTE — TOC Progression Note (Signed)
Transition of Care [facility]) - Progression Note    Patient Details  Name: Todd Brown MRN: [id number] Date of Birth: 05-14-1961  Transition of Care [facility]) CM/SW [place], RN Phone Number: 01/09/2022, 2:37 PM  Clinical Narrative:   Per Vicente Serene, administrator, he is not comfortable receiving patient with oxygen and does not feel that patient is suitable for his facility any longer.  He has not assessed patient but feels patient should be in long term care.  RNCM reached out to Legal Guardian at [place] and [place] [phone], awaiting reeturn call    Expected Discharge Plan: [place] Barriers to Discharge: Continued Medical Work up  Expected Discharge Plan and Services Expected Discharge Plan: [place] arrangements for the past 2 months: [place] Expected Discharge Date: 12/19/21                                     Social Determinants of Health (SDOH) Interventions    Readmission Risk Interventions     No data to display

## 2022-01-09 NOTE — Progress Notes (Signed)
Post HD vitals 

## 2022-01-09 NOTE — Progress Notes (Addendum)
Physical Therapy Treatment Patient Details Name: Todd Brown MRN: [id number] DOB: 1960/11/05 Today's Date: 01/09/2022   History of Present Illness 61 year old male with past medical history of hypertension and end-stage renal disease on hemodialysis presented to the emergency room from his group home/skilled nursing facility on 7/17 with confusion.  (Patient's baseline is that he is unable to care for himself and has a legal guardian.)  Patient last received dialysis on Friday, 7/14.  From being in the emergency room on 7/17, unable to get dialysis yesterday.  Patient found to have markedly elevated blood pressure with a systolic in the 629B.  Patient with questionable cardiac ischemia as well    PT Comments    Patient had dialysis earlier and was sitting up in the recliner chair. He declined standing but was agreeable to LE therapeutic exercises for strengthening to facilitate independence with functional mobility. No pain reported with exercises and no shortness of breath noted. PT will continue to follow. Noted that patient will be unable to return to the group home per report. SNF is recommended.    Recommendations for follow up therapy are one component of a multi-disciplinary discharge planning process, led by the attending physician.  Recommendations may be updated based on patient status, additional functional criteria and insurance authorization.  Follow Up Recommendations  Skilled nursing-short term rehab (<3 hours/day) Can patient physically be transported by private vehicle: No   Assistance Recommended at Discharge Intermittent Supervision/Assistance  Patient can return home with the following A little help with walking and/or transfers;A little help with bathing/dressing/bathroom;Help with stairs or ramp for entrance;Assist for transportation;Direct supervision/assist for medications management   Equipment Recommendations   (to be determined at next level of care)     Recommendations for Other Services       Precautions / Restrictions Precautions Precautions: Fall Restrictions Weight Bearing Restrictions: No     Mobility  Bed Mobility               General bed mobility comments: not assessed as patient sitting up on arrival and post session    Transfers                   General transfer comment: patient declined standing this session. he was agreeable to LE exercises for strengthening to maximize functional independence    Ambulation/Gait                   Stairs             Wheelchair Mobility    Modified Rankin (Stroke Patients Only)       Balance                                            Cognition Arousal/Alertness: Awake/alert Behavior During Therapy: WFL for tasks assessed/performed Overall Cognitive Status: No family/caregiver present to determine baseline cognitive functioning                         Following Commands: Follows one step commands with increased time Safety/Judgement: Decreased awareness of safety, Decreased awareness of deficits              Exercises General Exercises - Lower Extremity Ankle Circles/Pumps: AAROM, Strengthening, Both, 10 reps, Seated Short Arc Quad: AAROM, Strengthening, Both, 10 reps, Seated Long Arc Quad: AAROM, Strengthening, Both, 10 reps, Seated  Hip ABduction/ADduction: AAROM, Strengthening, Both, 10 reps, Seated Other Exercises Other Exercises: tactile and verbal cues for exercise technique.\    General Comments        Pertinent Vitals/Pain Pain Assessment Pain Assessment: No/denies pain    Home Living                          Prior Function            PT Goals (current goals can now be found in the care plan section) Acute Rehab PT Goals Patient Stated Goal: none stated PT Goal Formulation: Patient unable to participate in goal setting Time For Goal Achievement: 01/22/22 Potential to  Achieve Goals: Fair Progress towards PT goals: Progressing toward goals    Frequency    Min 2X/week      PT Plan Current plan remains appropriate    Co-evaluation              AM-PAC PT "6 Clicks" Mobility   Outcome Measure  Help needed turning from your back to your side while in a flat bed without using bedrails?: A Little Help needed moving from lying on your back to sitting on the side of a flat bed without using bedrails?: A Little Help needed moving to and from a bed to a chair (including a wheelchair)?: A Little Help needed standing up from a chair using your arms (e.g., wheelchair or bedside chair)?: A Little Help needed to walk in hospital room?: A Little Help needed climbing 3-5 steps with a railing? : Total 6 Click Score: 16    End of Session         PT Visit Diagnosis: Other abnormalities of gait and mobility (R26.89);Muscle weakness (generalized) (M62.81);Unsteadiness on feet (R26.81)     Time: 5027-7412 PT Time Calculation (min) (ACUTE ONLY): 13 min  Charges:  $Therapeutic Exercise: 8-22 mins                    [name], PT, MPT   [name] 01/09/2022, 3:50 PM

## 2022-01-09 NOTE — Progress Notes (Signed)
Pre HD RN assessment 

## 2022-01-09 NOTE — TOC Progression Note (Signed)
Transition of Care [facility]) - Progression Note    Patient Details  Name: Todd Brown MRN: [id number] Date of Birth: February 07, 1961  Transition of Care [facility]) CM/SW [place], RN Phone Number: 01/09/2022, 2:03 PM  Clinical Narrative:   Faciltiy representative from [facility] did not show up for assessment yesterday.  RNCM tried to reach [place] several times.  Shirlee Limerick called back and stated the Administrator is supposed to assess patient and she is not sure where he is.  Shirlee Limerick will contact him and communicate with [facility]    Expected Discharge Plan: [place] Barriers to Discharge: Continued Medical Work up  Expected Discharge Plan and Services Expected Discharge Plan: [place] arrangements for the past 2 months: [place] Expected Discharge Date: 12/19/21                                     Social Determinants of Health (SDOH) Interventions    Readmission Risk Interventions     No data to display

## 2022-01-10 DIAGNOSIS — I10 Essential (primary) hypertension: Secondary | ICD-10-CM | POA: Diagnosis not present

## 2022-01-10 DIAGNOSIS — N186 End stage renal disease: Secondary | ICD-10-CM | POA: Diagnosis not present

## 2022-01-10 DIAGNOSIS — R5381 Other malaise: Secondary | ICD-10-CM | POA: Diagnosis not present

## 2022-01-10 DIAGNOSIS — Z992 Dependence on renal dialysis: Secondary | ICD-10-CM | POA: Diagnosis not present

## 2022-01-10 LAB — CBC
HCT: 22.4 % — ABNORMAL LOW (ref 39.0–52.0)
Hemoglobin: 7.5 g/dL — ABNORMAL LOW (ref 13.0–17.0)
MCH: 33.8 pg (ref 26.0–34.0)
MCHC: 33.5 g/dL (ref 30.0–36.0)
MCV: 100.9 fL — ABNORMAL HIGH (ref 80.0–100.0)
Platelets: 206 10*3/uL (ref 150–400)
RBC: 2.22 MIL/uL — ABNORMAL LOW (ref 4.22–5.81)
RDW: 16.9 % — ABNORMAL HIGH (ref 11.5–15.5)
WBC: 5.3 10*3/uL (ref 4.0–10.5)
nRBC: 0.4 % — ABNORMAL HIGH (ref 0.0–0.2)

## 2022-01-10 LAB — BASIC METABOLIC PANEL
Anion gap: 8 (ref 5–15)
BUN: 58 mg/dL — ABNORMAL HIGH (ref 8–23)
CO2: 30 mmol/L (ref 22–32)
Calcium: 9.1 mg/dL (ref 8.9–10.3)
Chloride: 98 mmol/L (ref 98–111)
Creatinine, Ser: 5.24 mg/dL — ABNORMAL HIGH (ref 0.61–1.24)
GFR, Estimated: 12 mL/min — ABNORMAL LOW (ref >60)
Glucose, Bld: 73 mg/dL (ref 70–99)
Potassium: 4.5 mmol/L (ref 3.5–5.1)
Sodium: 136 mmol/L (ref 135–145)

## 2022-01-10 NOTE — Progress Notes (Signed)
[facility]  PROGRESS NOTE   Subjective:     Objective:  Vital signs: Blood pressure (!) 161/90, pulse 64, temperature 98.5 F (36.9 C), resp. rate 15, height 6' (1.829 m), weight 85.7 kg, SpO2 97 %.  Intake/Output Summary (Last 24 hours) at 01/10/2022 1204 Last data filed at 01/09/2022 1230 Gross per 24 hour  Intake --  Output 1500 ml  Net -1500 ml   Filed Weights   01/07/22 1218 01/09/22 0824 01/09/22 1240  Weight: 85.8 kg 86.7 kg 85.7 kg     Physical Exam: General:  No acute distress  Head:  Normocephalic, atraumatic. Moist oral mucosal membranes  Eyes:  Anicteric  Neck:  Supple  Lungs:   Clear to auscultation, normal effort  Heart:  S1S2 no rubs  Abdomen:   Soft, nontender, bowel sounds present  Extremities:  peripheral edema.  Neurologic:  Awake, alert, following commands  Skin:  No lesions  Access:     Basic Metabolic Panel: Recent Labs  Lab 01/05/22 0959 01/07/22 0734 01/08/22 0547 01/09/22 0746 01/10/22 0425  NA 133* 133* 136 135 136  K 5.3* 5.6* 4.7 5.2* 4.5  CL 93* 96* 99 96* 98  CO2 26 28 30 29 30   GLUCOSE 107* 80 91 85 73  BUN 70* 73* 59* 84* 58*  CREATININE 6.03* 6.41* 5.33* 6.90* 5.24*  CALCIUM 9.8 9.3 9.0 9.2 9.1  PHOS 2.0* 3.0  --   --   --     CBC: Recent Labs  Lab 01/05/22 0959 01/07/22 0734 01/08/22 0547 01/09/22 0746 01/10/22 0425  WBC 5.1 4.4 5.6 5.3 5.3  NEUTROABS 3.9 2.9  --   --   --   HGB 8.2* 7.3* 7.8* 7.4* 7.5*  HCT 23.9* 21.6* 23.5* 22.2* 22.4*  MCV 98.4 98.6 100.9* 100.0 100.9*  PLT 215 211 194 214 206     Urinalysis: No results for input(s): "COLORURINE", "LABSPEC", "PHURINE", "GLUCOSEU", "HGBUR", "BILIRUBINUR", "KETONESUR", "PROTEINUR", "UROBILINOGEN", "NITRITE", "LEUKOCYTESUR" in the last 72 hours.  Invalid input(s): "APPERANCEUR"    Imaging: No results found.   Medications:    anticoagulant sodium citrate      (feeding supplement) PROSource Plus  30 mL Oral TID BM   amLODipine  10 mg  Oral q morning   calcium acetate  667 mg Oral TID WC   carbamazepine  1,000 mg Oral q AM   Chlorhexidine Gluconate Cloth  6 each Topical Daily   cloNIDine  0.2 mg Transdermal Weekly   doxazosin  4 mg Oral QHS   epoetin (EPOGEN/PROCRIT) injection  10,000 Units Intravenous Q M,W,F-HD   ferrous sulfate  325 mg Oral Q breakfast   fluPHENAZine  2.5 mg Oral q AM   heparin  5,000 Units Subcutaneous Q8H   hydrALAZINE  5 mg Oral TID   irbesartan  300 mg Oral Daily   isosorbide mononitrate  60 mg Oral BID   minoxidil  5 mg Oral Daily   mometasone-formoterol  2 puff Inhalation BID   multivitamin  1 tablet Oral QHS   pantoprazole  40 mg Oral Daily   polyethylene glycol  17 g Oral BID   trihexyphenidyl  2 mg Oral BID WC    Assessment/ Plan:     Active Problems:   Essential hypertension   ESRD on dialysis [facility])   Mental health disorder   Physical deconditioning   Malnutrition of moderate degree  [name] is a 62 y.o.  male with past medical history including hypertension, anemia, schizophrenia  and end-stage renal disease on hemodialysis.  Patient has presented to the ED with altered mental status.  Patient has been admitted for Altered mental status [R41.82] Hypertensive urgency [I16.0] Severe hypertension [I10] Altered mental status, unspecified altered mental status type [R41.82]   [facility] [place]/[facility]/Lt AVF   End-stage renal disease on hemodialysis.  Will maintain outpatient schedule if possible.   Had stable dialysis yesterday.  Next treatment scheduled for Monday.                 2. Anemia of chronic kidney disease Recent Labs       Lab Results  Component Value Date    HGB 7.4 (L) 01/09/2022      Hgb remains below target. EPO received with dialysis treatments.      3. Secondary Hyperparathyroidism:  Recent Labs       Lab Results  Component Value Date    CALCIUM 9.2 01/09/2022    PHOS 3.0 01/07/2022      Will continue to monitor    4.  Hypertension  with chronic kidney disease.  Home regimen includes amlodipine, clonidine, hydralazine, isosorbide, and losartan.               Currently on amlodipine 10 mg daily, clonidine patch 0.2 mg per 24 hours, Cardura 4 mg daily at bedtime, hydralazine 10 mg 3 times per day, Avapro 300 mg daily, isosorbide 60 mg twice a day, minoxidil 5 mg daily.  Blood pressure 145/81 during idalysis  We will monitor closely.   LOS: [place], [facility] 8/12/202312:04 PM

## 2022-01-10 NOTE — Progress Notes (Signed)
PROGRESS NOTE    Todd Brown  HUT:[id number] DOB: 16-Apr-1961 DOA: 12/15/2021 PCP: Jodi Marble, MD  Assessment & Plan:   Active Problems:   ESRD on dialysis [facility])   Mental health disorder   Essential hypertension   Physical deconditioning   Malnutrition of moderate degree  Assessment and Plan: Acute metabolic encephalopathy: resolved. Mental status is back to baseline   Acute hypoxic respiratory failure: SaO2 dropped to 82% w/ standing on RA as per PT. Encourage incentive spirometry. Continue on supplemental oxygen and wean as tolerated   Hyperkalemia: WNL today    Hypertensive emergency: resolved    ESRD: on HD MWF. Nephro recs apprec  ACD: likely secondary to ESRD. Will transfuse if Hb < 7.0   Mental health disorder: unspecified. Unable to care for himself. Continue on home dose of tegretol, trihexyphenidyl   HTN: continue on doxazosin, minoxidil, clonidine, irbesartan, amlodipine, & hydralazine (reduced dose)   Oral thrush: resolved as of 01/02/2022   Subcutaneous emphysema: resolved as of 12/18/2021. Completed steroid course   Physical deconditioning: PT/OT recs SNF   Moderate malnutrition: continue w/ nutritional supplements      DVT prophylaxis: heparin  Code Status: full  Family Communication:  Disposition Plan:  unsafe d/c plan. Pt's previous group home refuses to take the pt back   Level of care: Med-Surg  Status is: Inpatient Remains inpatient appropriate because: pt's previous group home refuses to take him back. CM is working on safe d/c plan. Medically stable for d/c    Consultants:  Nephro   Procedures:   Antimicrobials:   Subjective: Pt denies any complaints    Objective: Vitals:   01/09/22 1642 01/09/22 2035 01/09/22 2143 01/10/22 0531  BP: 139/73 132/79 133/83 (!) 141/84  Pulse: 79 77 82 69  Resp:   18 18  Temp: 97.9 F (36.6 C)  98.6 F (37 C) 98.3 F (36.8 C)  TempSrc:   Oral Oral  SpO2: 99%  93% 92%  Weight:       Height:        Intake/Output Summary (Last 24 hours) at 01/10/2022 0728 Last data filed at 01/09/2022 1230 Gross per 24 hour  Intake --  Output 1500 ml  Net -1500 ml    Filed Weights   01/07/22 1218 01/09/22 0824 01/09/22 1240  Weight: 85.8 kg 86.7 kg 85.7 kg    Examination:  General exam: Appears sleepy   Respiratory system: diminished breath sounds b/l  Cardiovascular system: S1/S2+.  1+ pitting edema of b/l LE Gastrointestinal system: Abd is soft, NT, ND & normal bowel sounds  Central nervous system: Sleepy. Moves all extremities  Psychiatry: judgement and insight appears at baseline. Flat mood and affect     Data Reviewed: I have personally reviewed following labs and imaging studies  CBC: Recent Labs  Lab 01/05/22 0959 01/07/22 0734 01/08/22 0547 01/09/22 0746 01/10/22 0425  WBC 5.1 4.4 5.6 5.3 5.3  NEUTROABS 3.9 2.9  --   --   --   HGB 8.2* 7.3* 7.8* 7.4* 7.5*  HCT 23.9* 21.6* 23.5* 22.2* 22.4*  MCV 98.4 98.6 100.9* 100.0 100.9*  PLT 215 211 194 214 656   Basic Metabolic Panel: Recent Labs  Lab 01/05/22 0959 01/07/22 0734 01/08/22 0547 01/09/22 0746 01/10/22 0425  NA 133* 133* 136 135 136  K 5.3* 5.6* 4.7 5.2* 4.5  CL 93* 96* 99 96* 98  CO2 26 28 30 29 30   GLUCOSE 107* 80 91 85 73  BUN 70* 73*  59* 84* 58*  CREATININE 6.03* 6.41* 5.33* 6.90* 5.24*  CALCIUM 9.8 9.3 9.0 9.2 9.1  PHOS 2.0* 3.0  --   --   --    GFR: Estimated Creatinine Clearance: 16.2 mL/min (A) (by C-G formula based on SCr of 5.24 mg/dL (H)). Liver Function Tests: Recent Labs  Lab 01/05/22 0959 01/07/22 0734  ALBUMIN 3.5 2.9*   No results for input(s): "LIPASE", "AMYLASE" in the last 168 hours. No results for input(s): "AMMONIA" in the last 168 hours. Coagulation Profile: No results for input(s): "INR", "PROTIME" in the last 168 hours. Cardiac Enzymes: No results for input(s): "CKTOTAL", "CKMB", "CKMBINDEX", "TROPONINI" in the last 168 hours. BNP (last 3 results) No  results for input(s): "PROBNP" in the last 8760 hours. HbA1C: No results for input(s): "HGBA1C" in the last 72 hours. CBG: No results for input(s): "GLUCAP" in the last 168 hours. Lipid Profile: No results for input(s): "CHOL", "HDL", "LDLCALC", "TRIG", "CHOLHDL", "LDLDIRECT" in the last 72 hours. Thyroid Function Tests: No results for input(s): "TSH", "T4TOTAL", "FREET4", "T3FREE", "THYROIDAB" in the last 72 hours. Anemia Panel: No results for input(s): "VITAMINB12", "FOLATE", "FERRITIN", "TIBC", "IRON", "RETICCTPCT" in the last 72 hours. Sepsis Labs: No results for input(s): "PROCALCITON", "LATICACIDVEN" in the last 168 hours.  No results found for this or any previous visit (from the past 240 hour(s)).       Radiology Studies: No results found.      Scheduled Meds:  (feeding supplement) PROSource Plus  30 mL Oral TID BM   amLODipine  10 mg Oral q morning   calcium acetate  667 mg Oral TID WC   carbamazepine  1,000 mg Oral q AM   Chlorhexidine Gluconate Cloth  6 each Topical Daily   cloNIDine  0.2 mg Transdermal Weekly   doxazosin  4 mg Oral QHS   epoetin (EPOGEN/PROCRIT) injection  10,000 Units Intravenous Q M,W,F-HD   ferrous sulfate  325 mg Oral Q breakfast   fluPHENAZine  2.5 mg Oral q AM   heparin  5,000 Units Subcutaneous Q8H   hydrALAZINE  5 mg Oral TID   irbesartan  300 mg Oral Daily   isosorbide mononitrate  60 mg Oral BID   magic mouthwash  10 mL Oral QID   minoxidil  5 mg Oral Daily   mometasone-formoterol  2 puff Inhalation BID   multivitamin  1 tablet Oral QHS   pantoprazole  40 mg Oral Daily   polyethylene glycol  17 g Oral BID   trihexyphenidyl  2 mg Oral BID WC   Continuous Infusions:  anticoagulant sodium citrate       LOS: 26 days    Time spent: 25 mins     Wyvonnia Dusky, MD [facility] Pager [phone]  If 7PM-7AM, please contact night-coverage [email or web address] 01/10/2022, 7:28 AM

## 2022-01-11 LAB — BASIC METABOLIC PANEL
Anion gap: 7 (ref 5–15)
BUN: 74 mg/dL — ABNORMAL HIGH (ref 8–23)
CO2: 30 mmol/L (ref 22–32)
Calcium: 9.2 mg/dL (ref 8.9–10.3)
Chloride: 98 mmol/L (ref 98–111)
Creatinine, Ser: 5.83 mg/dL — ABNORMAL HIGH (ref 0.61–1.24)
GFR, Estimated: 10 mL/min — ABNORMAL LOW (ref >60)
Glucose, Bld: 72 mg/dL (ref 70–99)
Potassium: 5.1 mmol/L (ref 3.5–5.1)
Sodium: 135 mmol/L (ref 135–145)

## 2022-01-11 LAB — CBC
HCT: 22.4 % — ABNORMAL LOW (ref 39.0–52.0)
Hemoglobin: 7.4 g/dL — ABNORMAL LOW (ref 13.0–17.0)
MCH: 33.8 pg (ref 26.0–34.0)
MCHC: 33 g/dL (ref 30.0–36.0)
MCV: 102.3 fL — ABNORMAL HIGH (ref 80.0–100.0)
Platelets: 221 10*3/uL (ref 150–400)
RBC: 2.19 MIL/uL — ABNORMAL LOW (ref 4.22–5.81)
RDW: 17.2 % — ABNORMAL HIGH (ref 11.5–15.5)
WBC: 4.9 10*3/uL (ref 4.0–10.5)
nRBC: 0 % (ref 0.0–0.2)

## 2022-01-11 NOTE — Progress Notes (Signed)
PROGRESS NOTE    Todd Brown  IPJ:[id number] DOB: 02/12/1961 DOA: 12/15/2021 PCP: Jodi Marble, MD  Assessment & Plan:   Active Problems:   ESRD on dialysis [facility])   Mental health disorder   Essential hypertension   Physical deconditioning   Malnutrition of moderate degree  Assessment and Plan: Acute metabolic encephalopathy: resolved. Mental status is back baseline   Acute hypoxic respiratory failure: SaO2 dropped to 82% w/ standing on RA as per PT. Encourage incentive spirometry. Continue on supplemental oxygen and wean as tolerated   Hyperkalemia: WNL today    Hypertensive emergency: resolved    ESRD: on HD MWF. Nephro recs apprec   ACD: likely secondary to ESRD. Will transfuse if Hb < 7.0    Mental health disorder: unspecified. Unable to care for himself. Continue on home dose of trihexyphenidyl, tegretol   HTN: continue on doxazosin, minoxidil, clonidine, irbesartan, amlodipine, & hydralazine (reduced dose)   Oral thrush: resolved as of 01/02/2022   Subcutaneous emphysema: resolved as of 12/18/2021. Completed steroid course   Physical deconditioning: PT/OT recs SNF    Moderate malnutrition: continue w/ nutritional supplements      DVT prophylaxis: heparin  Code Status: full  Family Communication:  Disposition Plan:  unsafe d/c plan. Pt's previous group home refuses to take the pt back   Level of care: Med-Surg  Status is: Inpatient Remains inpatient appropriate because: pt's previous group home refuses to take him back. CM is working on safe d/c plan. Medically stable for d/c    Consultants:  Nephro   Procedures:   Antimicrobials:   Subjective: Pt c/o fatigue     Objective: Vitals:   01/10/22 0758 01/10/22 1636 01/10/22 2120 01/11/22 0535  BP: (!) 161/90 131/86 134/71 (!) 151/82  Pulse: 64 77 76 66  Resp: 15 16 18 16   Temp: 98.5 F (36.9 C) 97.9 F (36.6 C) 98.6 F (37 C) 98.2 F (36.8 C)  TempSrc:      SpO2: 97% 96% 92% 94%   Weight:      Height:       No intake or output data in the 24 hours ending 01/11/22 0730   Filed Weights   01/07/22 1218 01/09/22 0824 01/09/22 1240  Weight: 85.8 kg 86.7 kg 85.7 kg    Examination:  General exam: Appears uncomfortable   Respiratory system: decreased breath sounds b/l  Cardiovascular system: S1 & S2+. 1+ pitting edema of b/l LE Gastrointestinal system: Abd is soft, NT, ND & normal bowel sounds Central nervous system: Alert and awake. Moves all extremities Psychiatry: judgement and insight appear at baseline. Flat mood and affect     Data Reviewed: I have personally reviewed following labs and imaging studies  CBC: Recent Labs  Lab 01/05/22 0959 01/07/22 0734 01/08/22 0547 01/09/22 0746 01/10/22 0425 01/11/22 0314  WBC 5.1 4.4 5.6 5.3 5.3 4.9  NEUTROABS 3.9 2.9  --   --   --   --   HGB 8.2* 7.3* 7.8* 7.4* 7.5* 7.4*  HCT 23.9* 21.6* 23.5* 22.2* 22.4* 22.4*  MCV 98.4 98.6 100.9* 100.0 100.9* 102.3*  PLT 215 211 194 214 206 734   Basic Metabolic Panel: Recent Labs  Lab 01/05/22 0959 01/07/22 0734 01/08/22 0547 01/09/22 0746 01/10/22 0425 01/11/22 0314  NA 133* 133* 136 135 136 135  K 5.3* 5.6* 4.7 5.2* 4.5 5.1  CL 93* 96* 99 96* 98 98  CO2 26 28 30 29 30 30   GLUCOSE 107* 80 91 85 73  72  BUN 70* 73* 59* 84* 58* 74*  CREATININE 6.03* 6.41* 5.33* 6.90* 5.24* 5.83*  CALCIUM 9.8 9.3 9.0 9.2 9.1 9.2  PHOS 2.0* 3.0  --   --   --   --    GFR: Estimated Creatinine Clearance: 14.6 mL/min (A) (by C-G formula based on SCr of 5.83 mg/dL (H)). Liver Function Tests: Recent Labs  Lab 01/05/22 0959 01/07/22 0734  ALBUMIN 3.5 2.9*   No results for input(s): "LIPASE", "AMYLASE" in the last 168 hours. No results for input(s): "AMMONIA" in the last 168 hours. Coagulation Profile: No results for input(s): "INR", "PROTIME" in the last 168 hours. Cardiac Enzymes: No results for input(s): "CKTOTAL", "CKMB", "CKMBINDEX", "TROPONINI" in the last 168  hours. BNP (last 3 results) No results for input(s): "PROBNP" in the last 8760 hours. HbA1C: No results for input(s): "HGBA1C" in the last 72 hours. CBG: No results for input(s): "GLUCAP" in the last 168 hours. Lipid Profile: No results for input(s): "CHOL", "HDL", "LDLCALC", "TRIG", "CHOLHDL", "LDLDIRECT" in the last 72 hours. Thyroid Function Tests: No results for input(s): "TSH", "T4TOTAL", "FREET4", "T3FREE", "THYROIDAB" in the last 72 hours. Anemia Panel: No results for input(s): "VITAMINB12", "FOLATE", "FERRITIN", "TIBC", "IRON", "RETICCTPCT" in the last 72 hours. Sepsis Labs: No results for input(s): "PROCALCITON", "LATICACIDVEN" in the last 168 hours.  No results found for this or any previous visit (from the past 240 hour(s)).       Radiology Studies: No results found.      Scheduled Meds:  (feeding supplement) PROSource Plus  30 mL Oral TID BM   amLODipine  10 mg Oral q morning   calcium acetate  667 mg Oral TID WC   carbamazepine  1,000 mg Oral q AM   Chlorhexidine Gluconate Cloth  6 each Topical Daily   cloNIDine  0.2 mg Transdermal Weekly   doxazosin  4 mg Oral QHS   epoetin (EPOGEN/PROCRIT) injection  10,000 Units Intravenous Q M,W,F-HD   ferrous sulfate  325 mg Oral Q breakfast   fluPHENAZine  2.5 mg Oral q AM   heparin  5,000 Units Subcutaneous Q8H   hydrALAZINE  5 mg Oral TID   irbesartan  300 mg Oral Daily   isosorbide mononitrate  60 mg Oral BID   minoxidil  5 mg Oral Daily   mometasone-formoterol  2 puff Inhalation BID   multivitamin  1 tablet Oral QHS   pantoprazole  40 mg Oral Daily   polyethylene glycol  17 g Oral BID   trihexyphenidyl  2 mg Oral BID WC   Continuous Infusions:  anticoagulant sodium citrate       LOS: 27 days    Time spent: 15 mins     Wyvonnia Dusky, MD [facility] Pager [phone]  If 7PM-7AM, please contact night-coverage [email or web address] 01/11/2022, 7:30 AM

## 2022-01-11 NOTE — Progress Notes (Signed)
[facility]  PROGRESS NOTE   Subjective:   Much more awake today.  Able to converse.  Objective:  Vital signs: Blood pressure (!) 158/85, pulse 84, temperature 97.9 F (36.6 C), resp. rate 19, height 6' (1.829 m), weight 85.7 kg, SpO2 97 %.  Intake/Output Summary (Last 24 hours) at 01/11/2022 1941 Last data filed at 01/11/2022 1700 Gross per 24 hour  Intake 480 ml  Output --  Net 480 ml   Filed Weights   01/07/22 1218 01/09/22 0824 01/09/22 1240  Weight: 85.8 kg 86.7 kg 85.7 kg     Physical Exam: General:  No acute distress  Head:  Normocephalic, atraumatic. Moist oral mucosal membranes  Eyes:  Anicteric  Neck:  Supple  Lungs:   Clear to auscultation, normal effort  Heart:  S1S2 no rubs  Abdomen:   Soft, nontender, bowel sounds present  Extremities:  peripheral edema.  Neurologic:  Awake, alert, following commands  Skin:  No lesions  Access:     Basic Metabolic Panel: Recent Labs  Lab 01/05/22 0959 01/07/22 0734 01/08/22 0547 01/09/22 0746 01/10/22 0425 01/11/22 0314  NA 133* 133* 136 135 136 135  K 5.3* 5.6* 4.7 5.2* 4.5 5.1  CL 93* 96* 99 96* 98 98  CO2 26 28 30 29 30 30   GLUCOSE 107* 80 91 85 73 72  BUN 70* 73* 59* 84* 58* 74*  CREATININE 6.03* 6.41* 5.33* 6.90* 5.24* 5.83*  CALCIUM 9.8 9.3 9.0 9.2 9.1 9.2  PHOS 2.0* 3.0  --   --   --   --     CBC: Recent Labs  Lab 01/05/22 0959 01/07/22 0734 01/08/22 0547 01/09/22 0746 01/10/22 0425 01/11/22 0314  WBC 5.1 4.4 5.6 5.3 5.3 4.9  NEUTROABS 3.9 2.9  --   --   --   --   HGB 8.2* 7.3* 7.8* 7.4* 7.5* 7.4*  HCT 23.9* 21.6* 23.5* 22.2* 22.4* 22.4*  MCV 98.4 98.6 100.9* 100.0 100.9* 102.3*  PLT 215 211 194 214 206 221     Urinalysis: No results for input(s): "COLORURINE", "LABSPEC", "PHURINE", "GLUCOSEU", "HGBUR", "BILIRUBINUR", "KETONESUR", "PROTEINUR", "UROBILINOGEN", "NITRITE", "LEUKOCYTESUR" in the last 72 hours.  Invalid input(s): "APPERANCEUR"    Imaging: No results  found.   Medications:    anticoagulant sodium citrate      (feeding supplement) PROSource Plus  30 mL Oral TID BM   amLODipine  10 mg Oral q morning   calcium acetate  667 mg Oral TID WC   carbamazepine  1,000 mg Oral q AM   Chlorhexidine Gluconate Cloth  6 each Topical Daily   cloNIDine  0.2 mg Transdermal Weekly   doxazosin  4 mg Oral QHS   epoetin (EPOGEN/PROCRIT) injection  10,000 Units Intravenous Q M,W,F-HD   ferrous sulfate  325 mg Oral Q breakfast   fluPHENAZine  2.5 mg Oral q AM   heparin  5,000 Units Subcutaneous Q8H   hydrALAZINE  5 mg Oral TID   irbesartan  300 mg Oral Daily   isosorbide mononitrate  60 mg Oral BID   minoxidil  5 mg Oral Daily   mometasone-formoterol  2 puff Inhalation BID   multivitamin  1 tablet Oral QHS   pantoprazole  40 mg Oral Daily   polyethylene glycol  17 g Oral BID   trihexyphenidyl  2 mg Oral BID WC    Assessment/ Plan:     Active Problems:   Essential hypertension   ESRD on dialysis [facility])   Mental  health disorder   Physical deconditioning   Malnutrition of moderate degree  Todd Brown is a 61 y.o.  male with past medical history including hypertension, anemia, schizophrenia and end-stage renal disease on hemodialysis.  Patient has presented to the ED with altered mental status.  Patient has been admitted for Altered mental status [R41.82] Hypertensive urgency [I16.0] Severe hypertension [I10] Altered mental status, unspecified altered mental status type [R41.82]   [facility]   End-stage renal disease on hemodialysis.  Will maintain outpatient schedule if possible.   Had stable dialysis yesterday.  Next treatment scheduled for Monday.  Hyperkalemia resolved.                 2. Anemia of chronic kidney disease Recent Labs           Lab Results  Component Value Date    HGB 7.4 (L) 01/11/2022      Hgb remains below target. EPO received with dialysis treatments.      3. Secondary  Hyperparathyroidism:  Recent Labs           Lab Results  Component Value Date    CALCIUM 9.2 01/11/2022    PHOS 3.0 01/07/2022      Will continue to monitor    4.  Hypertensive urgency: Hypertension with chronic kidney disease.  Home regimen includes amlodipine, clonidine, hydralazine, isosorbide, and losartan.               Currently on amlodipine 10 mg daily, clonidine patch 0.2 mg per 24 hours, Cardura 4 mg daily at bedtime, hydralazine 10 mg 3 times per day, Avapro 300 mg daily, isosorbide 60 mg twice a day, minoxidil 5 mg daily.  Blood pressure 145/81 during idalysis   We will monitor closely.  Patient for skilled nursing facility placement.    LOS: [place], [facility] 8/13/20237:41 PM

## 2022-01-12 DIAGNOSIS — N186 End stage renal disease: Secondary | ICD-10-CM | POA: Diagnosis not present

## 2022-01-12 DIAGNOSIS — Z992 Dependence on renal dialysis: Secondary | ICD-10-CM | POA: Diagnosis not present

## 2022-01-12 DIAGNOSIS — R5381 Other malaise: Secondary | ICD-10-CM | POA: Diagnosis not present

## 2022-01-12 DIAGNOSIS — I10 Essential (primary) hypertension: Secondary | ICD-10-CM | POA: Diagnosis not present

## 2022-01-12 LAB — CBC
HCT: 23.9 % — ABNORMAL LOW (ref 39.0–52.0)
HCT: 24.1 % — ABNORMAL LOW (ref 39.0–52.0)
Hemoglobin: 8 g/dL — ABNORMAL LOW (ref 13.0–17.0)
Hemoglobin: 8.1 g/dL — ABNORMAL LOW (ref 13.0–17.0)
MCH: 34 pg (ref 26.0–34.0)
MCH: 33.6 pg (ref 26.0–34.0)
MCHC: 33.5 g/dL (ref 30.0–36.0)
MCHC: 33.6 g/dL (ref 30.0–36.0)
MCV: 101.7 fL — ABNORMAL HIGH (ref 80.0–100.0)
MCV: 100 fL (ref 80.0–100.0)
Platelets: 246 10*3/uL (ref 150–400)
Platelets: 246 10*3/uL (ref 150–400)
RBC: 2.35 MIL/uL — ABNORMAL LOW (ref 4.22–5.81)
RBC: 2.41 MIL/uL — ABNORMAL LOW (ref 4.22–5.81)
RDW: 17.5 % — ABNORMAL HIGH (ref 11.5–15.5)
RDW: 17.6 % — ABNORMAL HIGH (ref 11.5–15.5)
WBC: 5.1 10*3/uL (ref 4.0–10.5)
WBC: 4.7 10*3/uL (ref 4.0–10.5)
nRBC: 0 % (ref 0.0–0.2)
nRBC: 0 % (ref 0.0–0.2)

## 2022-01-12 LAB — BASIC METABOLIC PANEL
Anion gap: 10 (ref 5–15)
BUN: 92 mg/dL — ABNORMAL HIGH (ref 8–23)
CO2: 28 mmol/L (ref 22–32)
Calcium: 9.5 mg/dL (ref 8.9–10.3)
Chloride: 97 mmol/L — ABNORMAL LOW (ref 98–111)
Creatinine, Ser: 6.73 mg/dL — ABNORMAL HIGH (ref 0.61–1.24)
GFR, Estimated: 9 mL/min — ABNORMAL LOW (ref >60)
Glucose, Bld: 79 mg/dL (ref 70–99)
Potassium: 6 mmol/L — ABNORMAL HIGH (ref 3.5–5.1)
Sodium: 135 mmol/L (ref 135–145)

## 2022-01-12 LAB — RENAL FUNCTION PANEL
Albumin: 3.1 g/dL — ABNORMAL LOW (ref 3.5–5.0)
Anion gap: 11 (ref 5–15)
BUN: 93 mg/dL — ABNORMAL HIGH (ref 8–23)
CO2: 27 mmol/L (ref 22–32)
Calcium: 9.7 mg/dL (ref 8.9–10.3)
Chloride: 95 mmol/L — ABNORMAL LOW (ref 98–111)
Creatinine, Ser: 7.03 mg/dL — ABNORMAL HIGH (ref 0.61–1.24)
GFR, Estimated: 8 mL/min — ABNORMAL LOW (ref >60)
Glucose, Bld: 120 mg/dL — ABNORMAL HIGH (ref 70–99)
Phosphorus: 4.2 mg/dL (ref 2.5–4.6)
Potassium: 6.2 mmol/L — ABNORMAL HIGH (ref 3.5–5.1)
Sodium: 133 mmol/L — ABNORMAL LOW (ref 135–145)

## 2022-01-12 MED ORDER — PENTAFLUOROPROP-TETRAFLUOROETH EX AERO
1.0000 | INHALATION_SPRAY | CUTANEOUS | Status: DC | PRN
Start: 2022-01-12 — End: 2022-01-12

## 2022-01-12 MED ORDER — LIDOCAINE HCL (PF) 1 % IJ SOLN: 5.0000 mL | INTRAMUSCULAR | Status: DC | PRN

## 2022-01-12 MED ORDER — ANTICOAGULANT SODIUM CITRATE 4% (200MG/5ML) IV SOLN: 5.0000 mL | Status: DC | PRN

## 2022-01-12 MED ORDER — EPOETIN ALFA 10000 UNIT/ML IJ SOLN
INTRAMUSCULAR | Status: AC
  Administered 2022-01-12: 10000 [IU] via INTRAVENOUS
  Filled 2022-01-12: qty 1

## 2022-01-12 MED ORDER — LIDOCAINE-PRILOCAINE 2.5-2.5 % EX CREA: 1.0000 | TOPICAL_CREAM | CUTANEOUS | Status: DC | PRN

## 2022-01-12 MED ORDER — ALTEPLASE 2 MG IJ SOLR: 2.0000 mg | Freq: Once | INTRAMUSCULAR | Status: DC | PRN

## 2022-01-12 MED ORDER — CHLORHEXIDINE GLUCONATE CLOTH 2 % EX PADS
6.0000 | MEDICATED_PAD | Freq: Every day | CUTANEOUS | Status: DC
Start: 2022-01-12 — End: 2022-01-25
  Administered 2022-01-12 – 2022-01-25 (×14): 6 via TOPICAL

## 2022-01-12 MED ORDER — HEPARIN SODIUM (PORCINE) 1000 UNIT/ML DIALYSIS: 1000.0000 [IU] | INTRAMUSCULAR | Status: DC | PRN

## 2022-01-12 NOTE — Progress Notes (Signed)
Pre HD RN assessment 

## 2022-01-12 NOTE — Progress Notes (Signed)
Hd tx of 3.5hrs completed. 84L total vol processed. No complications noted. Report given to [facility], rn Total uf removed: 2574ml Post hd v/s: 97.9 169/75(101) 65 14 97% Post hd weight: 88.7kgs

## 2022-01-12 NOTE — Progress Notes (Signed)
PT Cancellation Note  Patient Details Name: Todd Brown MRN: [id number] DOB: August 01, 1960   Cancelled Treatment:    Reason Eval/Treat Not Completed: Patient at procedure or test/unavailable. Pt out of room for HD, not available for session. Will continue attempts as available.   [name] 01/12/2022, 10:09 AM [name], PT, DPT, [facility] [phone]

## 2022-01-12 NOTE — TOC Progression Note (Signed)
Transition of Care [facility]) - Progression Note    Patient Details  Name: Todd Brown MRN: [id number] Date of Birth: February 03, 1961  Transition of Care [facility]) CM/SW [place], RN Phone Number: 01/12/2022, 9:24 AM  Clinical Narrative:   Delayed entry from 01/09/2022  RNCM spoke with Kylie, and informed her that Assisted Living., they state that they would like LTC for patient, as they are not able to care for patient any longer.  Kylie asked that RNCM speak to other Assisted Living facilities for potential admission.    Expected Discharge Plan: [place] Barriers to Discharge: Continued Medical Work up  Expected Discharge Plan and Services Expected Discharge Plan: [place] arrangements for the past 2 months: [place] Expected Discharge Date: 12/19/21                                     Social Determinants of Health (SDOH) Interventions    Readmission Risk Interventions     No data to display

## 2022-01-12 NOTE — Progress Notes (Signed)
PROGRESS NOTE    Todd Brown  ZOX:[id number] DOB: 1961/01/13 DOA: 12/15/2021 PCP: Jodi Marble, MD  Assessment & Plan:   Active Problems:   ESRD on dialysis [facility])   Mental health disorder   Essential hypertension   Physical deconditioning   Malnutrition of moderate degree  Assessment and Plan: Acute metabolic encephalopathy: resolved. Mental status is back baseline   Acute hypoxic respiratory failure: SaO2 dropped to 82% w/ standing on RA as per PT. Encourage incentive spirometry. Continue on supplemental oxygen and wean as tolerated.   Encourage incentive spirometry. Continue on supplemental oxygen and wean as tolerated   Hyperkalemia: needs HD today     Hypertensive emergency: resolved    ESRD: on HD MWF. Nephro recs apprec   ACD: likely secondary to ESRD. Will transfuse if Hb < 7.0    Mental health disorder: unspecified. Unable to care for himself. Continue on home dose of trihexyphenidyl, tegretol   HTN: continue on amlodipine, doxazosin, clonidine, minoxidil, irbesartan & hydralazine (reduced dose)  Oral thrush: resolved as of 01/02/2022   Subcutaneous emphysema: resolved as of 12/18/2021. Completed steroid course   Physical deconditioning: PT/OT recs SNF    Moderate malnutrition: continue w/ nutritional supplements      DVT prophylaxis: heparin  Code Status: full  Family Communication:  Disposition Plan:  unsafe d/c plan. Pt's previous group home refuses to take the pt back   Level of care: Med-Surg  Status is: Inpatient Remains inpatient appropriate because: pt's previous group home refuses to take him back. CM is working on safe d/c plan. Medically stable for d/c    Consultants:  Nephro   Procedures:   Antimicrobials:   Subjective: Pt c/o malaise      Objective: Vitals:   01/11/22 1317 01/11/22 1607 01/11/22 2113 01/12/22 0503  BP: (!) 152/74 (!) 158/85 (!) 152/75 138/75  Pulse: 81 84 80 67  Resp:  19 20 18   Temp:  97.9 F (36.6  C) 97.9 F (36.6 C) 98 F (36.7 C)  TempSrc:   Oral Oral  SpO2:  97% 96% 97%  Weight:      Height:        Intake/Output Summary (Last 24 hours) at 01/12/2022 0729 Last data filed at 01/11/2022 1700 Gross per 24 hour  Intake 480 ml  Output --  Net 480 ml     Filed Weights   01/07/22 1218 01/09/22 0824 01/09/22 1240  Weight: 85.8 kg 86.7 kg 85.7 kg    Examination:  General exam: Appears comfortable  Respiratory system: diminished breath sounds b/l  Cardiovascular system: S1/S2+. +1 b/l LE pitting edema  Gastrointestinal system: Abd is soft, NT, ND & normal bowel sounds  Central nervous system: Alert and oriented. Moves all extremities  Psychiatry: Judgement and insight appears at baseline. Flat mood and affect     Data Reviewed: I have personally reviewed following labs and imaging studies  CBC: Recent Labs  Lab 01/05/22 0959 01/07/22 0734 01/08/22 0547 01/09/22 0746 01/10/22 0425 01/11/22 0314 01/12/22 0441  WBC 5.1 4.4 5.6 5.3 5.3 4.9 5.1  NEUTROABS 3.9 2.9  --   --   --   --   --   HGB 8.2* 7.3* 7.8* 7.4* 7.5* 7.4* 8.0*  HCT 23.9* 21.6* 23.5* 22.2* 22.4* 22.4* 23.9*  MCV 98.4 98.6 100.9* 100.0 100.9* 102.3* 101.7*  PLT 215 211 194 214 206 221 811   Basic Metabolic Panel: Recent Labs  Lab 01/05/22 0959 01/07/22 0734 01/08/22 0547 01/09/22 0746 01/10/22  0425 01/11/22 0314 01/12/22 0441  NA 133* 133* 136 135 136 135 135  K 5.3* 5.6* 4.7 5.2* 4.5 5.1 6.0*  CL 93* 96* 99 96* 98 98 97*  CO2 26 28 30 29 30 30 28   GLUCOSE 107* 80 91 85 73 72 79  BUN 70* 73* 59* 84* 58* 74* 92*  CREATININE 6.03* 6.41* 5.33* 6.90* 5.24* 5.83* 6.73*  CALCIUM 9.8 9.3 9.0 9.2 9.1 9.2 9.5  PHOS 2.0* 3.0  --   --   --   --   --    GFR: Estimated Creatinine Clearance: 12.7 mL/min (A) (by C-G formula based on SCr of 6.73 mg/dL (H)). Liver Function Tests: Recent Labs  Lab 01/05/22 0959 01/07/22 0734  ALBUMIN 3.5 2.9*   No results for input(s): "LIPASE", "AMYLASE" in the  last 168 hours. No results for input(s): "AMMONIA" in the last 168 hours. Coagulation Profile: No results for input(s): "INR", "PROTIME" in the last 168 hours. Cardiac Enzymes: No results for input(s): "CKTOTAL", "CKMB", "CKMBINDEX", "TROPONINI" in the last 168 hours. BNP (last 3 results) No results for input(s): "PROBNP" in the last 8760 hours. HbA1C: No results for input(s): "HGBA1C" in the last 72 hours. CBG: No results for input(s): "GLUCAP" in the last 168 hours. Lipid Profile: No results for input(s): "CHOL", "HDL", "LDLCALC", "TRIG", "CHOLHDL", "LDLDIRECT" in the last 72 hours. Thyroid Function Tests: No results for input(s): "TSH", "T4TOTAL", "FREET4", "T3FREE", "THYROIDAB" in the last 72 hours. Anemia Panel: No results for input(s): "VITAMINB12", "FOLATE", "FERRITIN", "TIBC", "IRON", "RETICCTPCT" in the last 72 hours. Sepsis Labs: No results for input(s): "PROCALCITON", "LATICACIDVEN" in the last 168 hours.  No results found for this or any previous visit (from the past 240 hour(s)).       Radiology Studies: No results found.      Scheduled Meds:  (feeding supplement) PROSource Plus  30 mL Oral TID BM   amLODipine  10 mg Oral q morning   calcium acetate  667 mg Oral TID WC   carbamazepine  1,000 mg Oral q AM   Chlorhexidine Gluconate Cloth  6 each Topical Daily   cloNIDine  0.2 mg Transdermal Weekly   doxazosin  4 mg Oral QHS   epoetin (EPOGEN/PROCRIT) injection  10,000 Units Intravenous Q M,W,F-HD   ferrous sulfate  325 mg Oral Q breakfast   fluPHENAZine  2.5 mg Oral q AM   heparin  5,000 Units Subcutaneous Q8H   hydrALAZINE  5 mg Oral TID   irbesartan  300 mg Oral Daily   isosorbide mononitrate  60 mg Oral BID   minoxidil  5 mg Oral Daily   mometasone-formoterol  2 puff Inhalation BID   multivitamin  1 tablet Oral QHS   pantoprazole  40 mg Oral Daily   polyethylene glycol  17 g Oral BID   trihexyphenidyl  2 mg Oral BID WC   Continuous Infusions:   anticoagulant sodium citrate       LOS: 28 days    Time spent: 25 mins     Wyvonnia Dusky, MD [facility] Pager [phone]  If 7PM-7AM, please contact night-coverage [email or web address] 01/12/2022, 7:29 AM

## 2022-01-12 NOTE — Progress Notes (Signed)
[facility]  ROUNDING NOTE   Subjective:   Todd Brown is a 61 year old male with past medical history including hypertension, anemia, schizophrenia and end-stage renal disease on hemodialysis.  Patient has presented to the ED with altered mental status.  Patient has been admitted for Altered mental status [R41.82] Hypertensive urgency [I16.0] Severe hypertension [I10] Altered mental status, unspecified altered mental status type [R41.82]  Patient is known to our practice and receives outpatient dialysis treatments at [facility] on a MWF schedule.  Update: Patient seen and evaluated during dialysis   HEMODIALYSIS FLOWSHEET:  Blood Flow Rate (mL/min): 400 mL/min Arterial Pressure (mmHg): -110 mmHg Venous Pressure (mmHg): 230 mmHg TMP (mmHg): 11 mmHg Ultrafiltration Rate (mL/min): 984 mL/min Dialysate Flow Rate (mL/min): 300 ml/min Dialysis Fluid Bolus: Normal Saline Bolus Amount (mL): 300 mL  Currently sitting up in bed, tolerating treatment well   Objective:  Vital signs in last 24 hours:  Temp:  [97.8 F (36.6 C)-98.9 F (37.2 C)] 97.8 F (36.6 C) (08/14 0913) Pulse Rate:  [60-84] 60 (08/14 1230) Resp:  [0-20] 0 (08/14 1230) BP: (138-185)/(73-85) 168/74 (08/14 1230) SpO2:  [94 %-98 %] 98 % (08/14 1230) Weight:  [91.2 kg] 91.2 kg (08/14 0814)  Weight change:  Filed Weights   01/09/22 0824 01/09/22 1240 01/12/22 0814  Weight: 86.7 kg 85.7 kg 91.2 kg    Intake/Output: I/O last 3 completed shifts: In: 480 [P.O.:480] Out: -    Intake/Output this shift:  No intake/output data recorded.  Physical Exam: General: NAD  Head: Normocephalic, atraumatic. Moist oral mucosal membranes  Eyes: Anicteric  Lungs:  Clear to auscultation, normal effort, room air  Heart: Regular rate and rhythm  Abdomen:  Soft, nontender, non distended  Extremities:  trace peripheral edema.  Neurologic: Alert, answer simple questions  Skin: No lesions  Access: Lt  AVF     Basic Metabolic Panel: Recent Labs  Lab 01/07/22 0734 01/08/22 0547 01/09/22 0746 01/10/22 0425 01/11/22 0314 01/12/22 0441 01/12/22 0926  NA 133*   < > 135 136 135 135 133*  K 5.6*   < > 5.2* 4.5 5.1 6.0* 6.2*  CL 96*   < > 96* 98 98 97* 95*  CO2 28   < > 29 30 30 28 27   GLUCOSE 80   < > 85 73 72 79 120*  BUN 73*   < > 84* 58* 74* 92* 93*  CREATININE 6.41*   < > 6.90* 5.24* 5.83* 6.73* 7.03*  CALCIUM 9.3   < > 9.2 9.1 9.2 9.5 9.7  PHOS 3.0  --   --   --   --   --  4.2   < > = values in this interval not displayed.     Liver Function Tests: Recent Labs  Lab 01/07/22 0734 01/12/22 0926  ALBUMIN 2.9* 3.1*    No results for input(s): "LIPASE", "AMYLASE" in the last 168 hours. No results for input(s): "AMMONIA" in the last 168 hours.  CBC: Recent Labs  Lab 01/07/22 0734 01/08/22 0547 01/09/22 0746 01/10/22 0425 01/11/22 0314 01/12/22 0441 01/12/22 0926  WBC 4.4   < > 5.3 5.3 4.9 5.1 4.7  NEUTROABS 2.9  --   --   --   --   --   --   HGB 7.3*   < > 7.4* 7.5* 7.4* 8.0* 8.1*  HCT 21.6*   < > 22.2* 22.4* 22.4* 23.9* 24.1*  MCV 98.6   < > 100.0 100.9* 102.3* 101.7* 100.0  PLT 211   < > 214 206 221 246 246   < > = values in this interval not displayed.     Cardiac Enzymes: No results for input(s): "CKTOTAL", "CKMB", "CKMBINDEX", "TROPONINI" in the last 168 hours.  BNP: Invalid input(s): "POCBNP"  CBG: No results for input(s): "GLUCAP" in the last 168 hours.   Microbiology: Results for orders placed or performed during the hospital encounter of 12/15/21  SARS Coronavirus 2 by RT PCR (hospital order, performed in [facility] hospital lab) *cepheid single result test* Anterior Nasal Swab     Status: None   Collection Time: 12/15/21  5:50 PM   Specimen: Anterior Nasal Swab  Result Value Ref Range Status   SARS Coronavirus 2 by RT PCR NEGATIVE NEGATIVE Final    Comment: (NOTE) SARS-CoV-2 target nucleic acids are NOT DETECTED.  The SARS-CoV-2 RNA is  generally detectable in upper and lower respiratory specimens during the acute phase of infection. The lowest concentration of SARS-CoV-2 viral copies this assay can detect is 250 copies / mL. A negative result does not preclude SARS-CoV-2 infection and should not be used as the sole basis for treatment or other patient management decisions.  A negative result may occur with improper specimen collection / handling, submission of specimen other than nasopharyngeal swab, presence of viral mutation(s) within the areas targeted by this assay, and inadequate number of viral copies (<250 copies / mL). A negative result must be combined with clinical observations, patient history, and epidemiological information.  Fact Sheet for Patients:   https://www.patel.info/  Fact Sheet for Healthcare Providers: https://hall.com/  This test is not yet approved or  cleared by the [place] FDA and has been authorized for detection and/or diagnosis of SARS-CoV-2 by FDA under an Emergency Use Authorization (EUA).  This EUA will remain in effect (meaning this test can be used) for the duration of the COVID-19 declaration under Section 564(b)(1) of the Act, 21 U.S.C. section 360bbb-3(b)(1), unless the authorization is terminated or revoked sooner.  Performed at [facility], [address] [place], [place] [address]   MRSA Next Gen by PCR, Nasal     Status: None   Collection Time: 12/15/21  9:03 PM   Specimen: Nasal Mucosa; Nasal Swab  Result Value Ref Range Status   MRSA by PCR Next Gen NOT DETECTED NOT DETECTED Final    Comment: (NOTE) The GeneXpert MRSA Assay (FDA approved for NASAL specimens only), is one component of a comprehensive MRSA colonization surveillance program. It is not intended to diagnose MRSA infection nor to guide or monitor treatment for MRSA infections. Test performance is not FDA approved in patients less than 42  years old. Performed at [facility], [address] [place], [place] [address]     Coagulation Studies: No results for input(s): "LABPROT", "INR" in the last 72 hours.  Urinalysis: No results for input(s): "COLORURINE", "LABSPEC", "PHURINE", "GLUCOSEU", "HGBUR", "BILIRUBINUR", "KETONESUR", "PROTEINUR", "UROBILINOGEN", "NITRITE", "LEUKOCYTESUR" in the last 72 hours.  Invalid input(s): "APPERANCEUR"    Imaging: No results found.   Medications:    anticoagulant sodium citrate      (feeding supplement) PROSource Plus  30 mL Oral TID BM   amLODipine  10 mg Oral q morning   calcium acetate  667 mg Oral TID WC   carbamazepine  1,000 mg Oral q AM   Chlorhexidine Gluconate Cloth  6 each Topical Daily   Chlorhexidine Gluconate Cloth  6 each Topical Q0600   cloNIDine  0.2 mg  Transdermal Weekly   doxazosin  4 mg Oral QHS   epoetin (EPOGEN/PROCRIT) injection  10,000 Units Intravenous Q M,W,F-HD   ferrous sulfate  325 mg Oral Q breakfast   fluPHENAZine  2.5 mg Oral q AM   heparin  5,000 Units Subcutaneous Q8H   hydrALAZINE  5 mg Oral TID   irbesartan  300 mg Oral Daily   isosorbide mononitrate  60 mg Oral BID   minoxidil  5 mg Oral Daily   mometasone-formoterol  2 puff Inhalation BID   multivitamin  1 tablet Oral QHS   pantoprazole  40 mg Oral Daily   polyethylene glycol  17 g Oral BID   trihexyphenidyl  2 mg Oral BID WC   albuterol, alteplase, alteplase, anticoagulant sodium citrate, diphenhydrAMINE, haloperidol lactate, heparin, hydrALAZINE, lidocaine (PF), lidocaine (PF), lidocaine-prilocaine, ondansetron **OR** ondansetron (ZOFRAN) IV, mouth rinse, pentafluoroprop-tetrafluoroeth, polyethylene glycol, senna-docusate  Assessment/ Plan:  [name] is a 61 y.o.  male with past medical history including hypertension, anemia, schizophrenia and end-stage renal disease on hemodialysis.  Patient has presented to the ED with altered mental status.  Patient has been  admitted for Altered mental status [R41.82] Hypertensive urgency [I16.0] Severe hypertension [I10] Altered mental status, unspecified altered mental status type [R41.82]  [facility]/MWF/Lt AVF  End-stage renal disease on hemodialysis.  Will maintain outpatient schedule if possible.   Receiving scheduled dialysis, UF goal 2 to 2.5 L as tolerated.  Next treatment scheduled for Wednesday.  Renal navigator monitoring discharge planning to assess changes in outpatient needs.     2. Anemia of chronic kidney disease Lab Results  Component Value Date   HGB 8.1 (L) 01/12/2022    Hemoglobin below desired range.  Continue EPO with dialysis treatments.    3. Secondary Hyperparathyroidism:  Lab Results  Component Value Date   CALCIUM 9.7 01/12/2022   PHOS 4.2 01/12/2022   We will continue to monitor bone minerals during this admission.  4.  Hypertension with chronic kidney disease.  Home regimen includes amlodipine, clonidine, hydralazine, isosorbide, and losartan.    Currently on amlodipine 10 mg daily, clonidine patch 0.2 mg per 24 hours, Cardura 4 mg daily at bedtime, hydralazine 10 mg 3 times per day, Avapro 300 mg daily, isosorbide 60 mg twice a day, minoxidil 5 mg daily.  Blood pressure elevated, 169/75 during dialysis.    LOS: [place] 8/14/20231:01 PM

## 2022-01-12 NOTE — Progress Notes (Signed)
Lateef, MD added 0.5L to patient UF goal. Removing 2.5L. Safety maintained, vss.

## 2022-01-13 ENCOUNTER — Encounter: Payer: Self-pay | Admitting: Internal Medicine

## 2022-01-13 DIAGNOSIS — N186 End stage renal disease: Secondary | ICD-10-CM | POA: Diagnosis not present

## 2022-01-13 DIAGNOSIS — E875 Hyperkalemia: Secondary | ICD-10-CM

## 2022-01-13 DIAGNOSIS — Z992 Dependence on renal dialysis: Secondary | ICD-10-CM | POA: Diagnosis not present

## 2022-01-13 DIAGNOSIS — R5381 Other malaise: Secondary | ICD-10-CM | POA: Diagnosis not present

## 2022-01-13 LAB — CBC
HCT: 24.7 % — ABNORMAL LOW (ref 39.0–52.0)
Hemoglobin: 8.2 g/dL — ABNORMAL LOW (ref 13.0–17.0)
MCH: 33.6 pg (ref 26.0–34.0)
MCHC: 33.2 g/dL (ref 30.0–36.0)
MCV: 101.2 fL — ABNORMAL HIGH (ref 80.0–100.0)
Platelets: 223 10*3/uL (ref 150–400)
RBC: 2.44 MIL/uL — ABNORMAL LOW (ref 4.22–5.81)
RDW: 17.3 % — ABNORMAL HIGH (ref 11.5–15.5)
WBC: 4.4 10*3/uL (ref 4.0–10.5)
nRBC: 0 % (ref 0.0–0.2)

## 2022-01-13 LAB — BASIC METABOLIC PANEL
Anion gap: 9 (ref 5–15)
BUN: 61 mg/dL — ABNORMAL HIGH (ref 8–23)
CO2: 30 mmol/L (ref 22–32)
Calcium: 9.4 mg/dL (ref 8.9–10.3)
Chloride: 96 mmol/L — ABNORMAL LOW (ref 98–111)
Creatinine, Ser: 5.06 mg/dL — ABNORMAL HIGH (ref 0.61–1.24)
GFR, Estimated: 12 mL/min — ABNORMAL LOW (ref >60)
Glucose, Bld: 85 mg/dL (ref 70–99)
Potassium: 5.3 mmol/L — ABNORMAL HIGH (ref 3.5–5.1)
Sodium: 135 mmol/L (ref 135–145)

## 2022-01-13 MED ORDER — SODIUM ZIRCONIUM CYCLOSILICATE 10 G PO PACK
10.0000 g | PACK | Freq: Once | ORAL | Status: DC
  Filled 2022-01-13: qty 1

## 2022-01-13 MED ORDER — PATIROMER SORBITEX CALCIUM 8.4 G PO PACK
16.8000 g | PACK | Freq: Every day | ORAL | Status: DC
  Administered 2022-01-13 – 2022-02-18 (×34): 16.8 g via ORAL
  Filled 2022-01-13 (×38): qty 2

## 2022-01-13 NOTE — Progress Notes (Signed)
PROGRESS NOTE   HPI was taken from Dr. Tobie Brown: [name] is a 61 year old male with history of hypertension, **   Initial vitals in the emergency department showed temperature of 96.4, respiration rate of 18, heart rate of 54, improved to 70, initial blood pressure 226/104, improved to 182/78, SPO2 of 95% on room air.  Serum sodium is 137, potassium 4.9, chloride 97, bicarb 25, BUN of 51, serum creatinine of 6.91, GFR of 8, nonfasting blood glucose 86, WBC 3.7, hemoglobin 11.9, platelets of 148.  High sensitive troponin was elevated at 30.  ED treatment: Labetalol 10 mg IV one-time dose given in the ED   At bedside patient mumbles incoherently when I talk to him and ask him his full name, his age, his current location.   Per EDP, nephrology thought that patient's was not himself at his prior hemodialysis session.   Social history: Patient is from [place] years facility.   ROS: Unable to complete   ED Course: Discussed with emergency medicine provider, patient requiring hospitalization for chief concerns of altered mental status and hypertensive urgency/emergency.   As per Dr. [place] Brown: 61 year old male with past medical history of hypertension and end-stage renal disease on hemodialysis presented to the emergency room from his group home/skilled nursing facility on 7/17 with confusion.  (Patient's baseline is that he is unable to care for himself and has a legal guardian.)  Patient last received dialysis on Friday, 7/14.  FPatient found to have markedly elevated blood pressure with a systolic in the 829F requiring Cardene drip and placement of patient to the ICU.  Patient with questionable cardiac ischemia as well.  CT scan of head and neurological exam unremarkable.  Patient was admitted to the hospitalist service and started on his oral antihypertensives, nicardipine drip and given IV labetalol and as needed IV hydralazine.  Nephrology consulted and with plans to resume patient's  dialysis   Attempts to wean off Cardene have led to increasing high blood pressure, compounded by patient at first refusing to take oral medications.  Had become more physically agitated, wanting to be left alone.  Seen by psychiatry and started on scheduled and as needed Haldol.  Has become more compliant and better mentating and taking medications and blood pressure improving.  Also developed some periorbital swelling bilaterally felt to be secondary to subcutaneous emphysema and started on steroids, and periorbital swelling has since resolved.  Over time, blood pressure became more manageable.  Patient was supposed to be discharged back to his assisted living facility on 7/21 after dialysis.  His baseline is that he is able to sit in a chair for dialysis although some assistance.  And trying to get him out of bed and placed in a wheelchair, took 3 people and still patient unable to sit and stay in chair.  Discharge canceled and plan is for patient to have PT/OT evaluation and possible skilled nursing prior to returning to assisted living.   PT and OT are recommending rehab.  Patient with very limited options in terms of excepting skilled nursing facilities.  Found to have some oral thrush.  Started on Magic mouthwash and prednisone was discontinued.  Patient did have to return to stepdown on 7/25 with restarting of Cardene drip which was able to be discontinued by 7/26.  At this time, he is still waiting for skilled nursing.  Blood pressure has become much more manageable and additional antihypertensives that were added to patient's medication regimen during this hospitalization are being scaled back.  As per Dr. Jimmye Brown 8/9-8/15/23: Pt has been medically stable this week but waiting on safe d/c plan as pt's group home refused to take the pt back. CM is working on other options. See CM notes.     Todd Brown  Todd Brown DOB: [birth year] DOA: 12/15/2021 PCP: Todd Marble, MD  Assessment  & Plan:   Active Problems:   ESRD on dialysis [facility])   Mental health disorder   Essential hypertension   Physical deconditioning   Malnutrition of moderate degree  Assessment and Plan: Acute metabolic encephalopathy: resolved. Mental status is back baseline   Acute hypoxic respiratory failure: SaO2 dropped to 82% w/ standing on RA as per PT. Encourage incentive spirometry. Continue on supplemental oxygen and wean as tolerated.   Hyperkalemia: started on veltassa as per nephro    Hypertensive emergency: resolved    ESRD: on HD MWF. Nephro following and recs apprec  ACD: likely secondary to ESRD. Will transfuse if Hb < 7.0    Mental health disorder: unspecified. Unable to care for himself. Continue on home dose of tegretol, trihexphenidyl HTN: continue on amlodipine, doxazosin, clonidine, minoxidil, irbesartan & hydralazine (reduced dose)  Oral thrush: resolved as of 01/02/2022   Subcutaneous emphysema: resolved as of 12/18/2021. Completed steroid course   Physical deconditioning: PT/OT recs SNF   Moderate malnutrition: continue on nutritional support      DVT prophylaxis: heparin  Code Status: full  Family Communication:  Disposition Plan:  unsafe d/c plan. Pt's previous group home refuses to take the pt back   Level of care: Med-Surg  Status is: Inpatient Remains inpatient appropriate because: pt's previous group home refuses to take him back. CM is working on safe d/c plan. Medically stable for d/c    Consultants:  Nephro   Procedures:   Antimicrobials:   Subjective: Pt denies any complaints    Objective: Vitals:   01/12/22 1400 01/12/22 1613 01/12/22 2057 01/13/22 0625  BP: (!) 159/77 (!) 151/65 (!) 164/77 (!) 143/76  Pulse: 71 64 68 65  Resp: 16 18 18 18   Temp:  98.1 F (36.7 C) 98.4 F (36.9 C) (!) 97.5 F (36.4 C)  TempSrc:  Oral    SpO2: 95% 99% 97% 97%  Weight:      Height:        Intake/Output Summary (Last 24 hours) at 01/13/2022  0737 Last data filed at 01/12/2022 2145 Gross per 24 hour  Intake 120 ml  Output 2500 ml  Net -2380 ml     Filed Weights   01/09/22 1240 01/12/22 0814 01/12/22 1255  Weight: 85.7 kg 91.2 kg 88.7 kg    Examination:  General exam: Appears calm & comfortable  Respiratory system: decreased breath sounds b/l  Cardiovascular system: S1 & S2+. +1 b/l LE pitting edema  Gastrointestinal system: Abd is soft, NT, ND & hypoactive bowel sounds  Central nervous system: alert and oriented. Moves all extremities  Psychiatry: judgement and insight appears at baseline. Flat mood and affect     Data Reviewed: I have personally reviewed following labs and imaging studies  CBC: Recent Labs  Lab 01/07/22 0734 01/08/22 0547 01/10/22 0425 01/11/22 0314 01/12/22 0441 01/12/22 0926 01/13/22 0532  WBC 4.4   < > 5.3 4.9 5.1 4.7 4.4  NEUTROABS 2.9  --   --   --   --   --   --   HGB 7.3*   < > 7.5* 7.4* 8.0* 8.1* 8.2*  HCT 21.6*   < >  22.4* 22.4* 23.9* 24.1* 24.7*  MCV 98.6   < > 100.9* 102.3* 101.7* 100.0 101.2*  PLT 211   < > 206 221 246 246 223   < > = values in this interval not displayed.   Basic Metabolic Panel: Recent Labs  Lab 01/07/22 0734 01/08/22 0547 01/10/22 0425 01/11/22 0314 01/12/22 0441 01/12/22 0926 01/13/22 0532  NA 133*   < > 136 135 135 133* 135  K 5.6*   < > 4.5 5.1 6.0* 6.2* 5.3*  CL 96*   < > 98 98 97* 95* 96*  CO2 28   < > 30 30 28 27 30   GLUCOSE 80   < > 73 72 79 120* 85  BUN 73*   < > 58* 74* 92* 93* 61*  CREATININE 6.41*   < > 5.24* 5.83* 6.73* 7.03* 5.06*  CALCIUM 9.3   < > 9.1 9.2 9.5 9.7 9.4  PHOS 3.0  --   --   --   --  4.2  --    < > = values in this interval not displayed.   GFR: Estimated Creatinine Clearance: 16.8 mL/min (A) (by C-G formula based on SCr of 5.06 mg/dL (H)). Liver Function Tests: Recent Labs  Lab 01/07/22 0734 01/12/22 0926  ALBUMIN 2.9* 3.1*   No results for input(s): "LIPASE", "AMYLASE" in the last 168 hours. No results  for input(s): "AMMONIA" in the last 168 hours. Coagulation Profile: No results for input(s): "INR", "PROTIME" in the last 168 hours. Cardiac Enzymes: No results for input(s): "CKTOTAL", "CKMB", "CKMBINDEX", "TROPONINI" in the last 168 hours. BNP (last 3 results) No results for input(s): "PROBNP" in the last 8760 hours. HbA1C: No results for input(s): "HGBA1C" in the last 72 hours. CBG: No results for input(s): "GLUCAP" in the last 168 hours. Lipid Profile: No results for input(s): "CHOL", "HDL", "LDLCALC", "TRIG", "CHOLHDL", "LDLDIRECT" in the last 72 hours. Thyroid Function Tests: No results for input(s): "TSH", "T4TOTAL", "FREET4", "T3FREE", "THYROIDAB" in the last 72 hours. Anemia Panel: No results for input(s): "VITAMINB12", "FOLATE", "FERRITIN", "TIBC", "IRON", "RETICCTPCT" in the last 72 hours. Sepsis Labs: No results for input(s): "PROCALCITON", "LATICACIDVEN" in the last 168 hours.  No results found for this or any previous visit (from the past 240 hour(s)).       Radiology Studies: No results found.      Scheduled Meds:  (feeding supplement) PROSource Plus  30 mL Oral TID BM   amLODipine  10 mg Oral q morning   calcium acetate  667 mg Oral TID WC   carbamazepine  1,000 mg Oral q AM   Chlorhexidine Gluconate Cloth  6 each Topical Daily   Chlorhexidine Gluconate Cloth  6 each Topical Q0600   cloNIDine  0.2 mg Transdermal Weekly   doxazosin  4 mg Oral QHS   epoetin (EPOGEN/PROCRIT) injection  10,000 Units Intravenous Q M,W,F-HD   ferrous sulfate  325 mg Oral Q breakfast   fluPHENAZine  2.5 mg Oral q AM   heparin  5,000 Units Subcutaneous Q8H   hydrALAZINE  5 mg Oral TID   irbesartan  300 mg Oral Daily   isosorbide mononitrate  60 mg Oral BID   minoxidil  5 mg Oral Daily   mometasone-formoterol  2 puff Inhalation BID   multivitamin  1 tablet Oral QHS   pantoprazole  40 mg Oral Daily   polyethylene glycol  17 g Oral BID   trihexyphenidyl  2 mg Oral BID WC    Continuous  Infusions:  anticoagulant sodium citrate       LOS: 29 days    Time spent: 25 mins     Wyvonnia Dusky, MD [facility] Pager [phone]  If 7PM-7AM, please contact night-coverage [email or web address] 01/13/2022, 7:37 AM

## 2022-01-13 NOTE — Progress Notes (Signed)
Physical Therapy Treatment Patient Details Name: Todd Brown MRN: [id number] DOB: February 22, 1961 Today's Date: 01/13/2022   History of Present Illness 61 year old male with past medical history of hypertension and end-stage renal disease on hemodialysis presented to the emergency room from his group home/skilled nursing facility on 7/17 with confusion.  (Patient's baseline is that he is unable to care for himself and has a legal guardian.)  Patient last received dialysis on Friday, 7/14.  From being in the emergency room on 7/17, unable to get dialysis yesterday.  Patient found to have markedly elevated blood pressure with a systolic in the 416S.  Patient with questionable cardiac ischemia as well    PT Comments    Pt is making gradual progress towards goals, however limited by lethargy this date. Pt received seated in recliner with eyes closed. Poor participation in session, limited by fatigue. Continue to recommend SNF at this time. Will progress as able.  Recommendations for follow up therapy are one component of a multi-disciplinary discharge planning process, led by the attending physician.  Recommendations may be updated based on patient status, additional functional criteria and insurance authorization.  Follow Up Recommendations  Skilled nursing-short term rehab (<3 hours/day) Can patient physically be transported by private vehicle: No   Assistance Recommended at Discharge Intermittent Supervision/Assistance  Patient can return home with the following A lot of help with walking and/or transfers;A lot of help with bathing/dressing/bathroom;Assist for transportation   Equipment Recommendations  Rolling walker (2 wheels);BSC/3in1;Wheelchair cushion (measurements PT);Wheelchair (measurements PT)    Recommendations for Other Services       Precautions / Restrictions Precautions Precautions: Fall Precaution Comments: vision impaired     Mobility  Bed Mobility                General bed mobility comments: not assessed as patient sitting up on arrival and post session    Transfers Overall transfer level: Needs assistance Equipment used: Rolling walker (2 wheels) Transfers: Sit to/from Stand Sit to Stand: Min assist           General transfer comment: able to stand, however unsteady and has uncontrolled decent back to chair. Unwilling to reattempt due to lethargy.    Ambulation/Gait               General Gait Details: NT   Stairs             Wheelchair Mobility    Modified Rankin (Stroke Patients Only)       Balance Overall balance assessment: Needs assistance Sitting-balance support: Feet supported Sitting balance-[name]: Fair     Standing balance support: Bilateral upper extremity supported Standing balance-[name]: Poor Standing balance comment: LOB                            Cognition Arousal/Alertness: Lethargic Behavior During Therapy: Flat affect Overall Cognitive Status: No family/caregiver present to determine baseline cognitive functioning                                 General Comments: Pt very flat today, however pleasant. Noted increased edema present in B UE/LEs increased from last time this therapist worked with him.        Exercises Other Exercises Other Exercises: attempted B UE/LE ther-ex, however poor participation due to lethargy. Performed B UE shoulder flexion, elbow flexion/extension; B LE LAQ. Pt would performed around 3-5  reps and then stop. Unable to further engage in activity    General Comments        Pertinent Vitals/Pain Pain Assessment Pain Assessment: No/denies pain    Home Living                          Prior Function            PT Goals (current goals can now be found in the care plan section) Acute Rehab PT Goals Patient Stated Goal: none stated PT Goal Formulation: Patient unable to participate in goal setting Time For  Goal Achievement: 01/22/22 Potential to Achieve Goals: Fair Progress towards PT goals: Progressing toward goals    Frequency    Min 2X/week      PT Plan Current plan remains appropriate    Co-evaluation              AM-PAC PT "6 Clicks" Mobility   Outcome Measure  Help needed turning from your back to your side while in a flat bed without using bedrails?: A Little Help needed moving from lying on your back to sitting on the side of a flat bed without using bedrails?: A Little Help needed moving to and from a bed to a chair (including a wheelchair)?: A Little Help needed standing up from a chair using your arms (e.g., wheelchair or bedside chair)?: A Little Help needed to walk in hospital room?: A Lot Help needed climbing 3-5 steps with a railing? : Total 6 Click Score: 15    End of Session   Activity Tolerance: Patient limited by lethargy Patient left: in chair;with chair alarm set Nurse Communication: Mobility status PT Visit Diagnosis: Other abnormalities of gait and mobility (R26.89);Muscle weakness (generalized) (M62.81);Unsteadiness on feet (R26.81)     Time: 1884-1660 PT Time Calculation (min) (ACUTE ONLY): 10 min  Charges:  $Therapeutic Exercise: 8-22 mins                     [name], PT, DPT, GCS [phone]    Cy Bresee 01/13/2022, 11:17 AM

## 2022-01-13 NOTE — Progress Notes (Signed)
Nutrition Follow-up  DOCUMENTATION CODES:   Non-severe (moderate) malnutrition in context of chronic illness  INTERVENTION:   -Continue 30 ml Prosource Plus TID, each supplement provides 100 kcals and 15 grams protein -Continue renal MVI daily -Continue double protein portions with meals -Continue feeding assistance with meals -RD will sign off secondary to medical stability; if further nutrition related needs are identified, please re-consult RD  NUTRITION DIAGNOSIS:   Moderate Malnutrition related to chronic illness (ESRD on HD) as evidenced by mild fat depletion, mild muscle depletion, moderate muscle depletion.  Ongoing  GOAL:   Patient will meet greater than or equal to 90% of their needs  Progressing   MONITOR:   PO intake, Supplement acceptance, Labs, Weight trends, Skin, I & O's  REASON FOR ASSESSMENT:   Malnutrition Screening Tool    ASSESSMENT:   61 y/o male with h/o HTN, ESRD on HD, schizophrenia and lives in a group home who is admitted with thrush and AMS.  7/26- s/p BSE- dysphagia 3 diet with thin liquids 8/4- oral thrush resolved 8/12- downgraded to dysphagia 1 diet  Reviewed I/O's: -2.4 L x 24 hours and -5.5 L since 12/30/21  Pt sleeping soundly at time of visit.   Pt remains with good oral intake. Noted meal completions 50-100%. He continues to take Prosource supplements well.    No wt loss since admission. Pt with some edema per nursing assessment. Pt also with improved oral intake, so weight gain favorable given pt's history of malnutrition.   Medications reviewed and include phoslo, epogen, ferrous sulfate, miralax, and lokelma.   Pt is medically stable for discharge; awaiting SNF placement.   Labs reviewed: K: 5.3, CBGS: 103 (inpatient orders for glycemic control are none).    Diet Order:   Diet Order             DIET - DYS 1 Room service appropriate? Yes; Fluid consistency: Thin  Diet effective now           DIET DYS 3                    EDUCATION NEEDS:   No education needs have been identified at this time  Skin:  Skin Assessment: Reviewed RN Assessment  Last BM:  01/11/22  Height:   Ht Readings from Last 1 Encounters:  12/22/21 6' (1.829 m)    Weight:   Wt Readings from Last 1 Encounters:  01/12/22 88.7 kg    Ideal Body Weight:  80.9 kg  BMI:  Body mass index is 26.52 kg/m.  Estimated Nutritional Needs:   Kcal:  2100-2300  Protein:  105-120 grams  Fluid:  1000 ml +UOP    Loistine Chance, RD, LDN, [place] Registered Dietitian II Certified Diabetes Care and Education Specialist Please refer to [facility] for RD and/or RD on-call/weekend/after hours pager

## 2022-01-13 NOTE — Progress Notes (Signed)
[facility]  ROUNDING NOTE   Subjective:   Todd Brown is a 61 year old male with past medical history including hypertension, anemia, schizophrenia and end-stage renal disease on hemodialysis.  Patient has presented to the ED with altered mental status.  Patient has been admitted for Altered mental status [R41.82] Hypertensive urgency [I16.0] Severe hypertension [I10] Altered mental status, unspecified altered mental status type [R41.82]  Patient is known to our practice and receives outpatient dialysis treatments at [facility] on a MWF schedule.  Update: Patient seen sitting up in bed, alert and oriented Denies pain or discomfort Dialysis yesterday, tolerated well   Objective:  Vital signs in last 24 hours:  Temp:  [97.5 F (36.4 C)-98.5 F (36.9 C)] 98.5 F (36.9 C) (08/15 0749) Pulse Rate:  [62-71] 62 (08/15 0749) Resp:  [16-18] 17 (08/15 0749) BP: (143-169)/(65-77) 169/75 (08/15 0749) SpO2:  [95 %-99 %] 97 % (08/15 0749)  Weight change:  Filed Weights   01/09/22 1240 01/12/22 0814 01/12/22 1255  Weight: 85.7 kg 91.2 kg 88.7 kg    Intake/Output: I/O last 3 completed shifts: In: 120 [P.O.:120] Out: 2500 [Other:2500]   Intake/Output this shift:  No intake/output data recorded.  Physical Exam: General: NAD  Head: Normocephalic, atraumatic. Moist oral mucosal membranes  Eyes: Anicteric  Lungs:  Clear to auscultation, normal effort, room air  Heart: Regular rate and rhythm  Abdomen:  Soft, nontender, non distended  Extremities:  trace peripheral edema.  Neurologic: Alert, answer simple questions  Skin: No lesions  Access: Lt AVF     Basic Metabolic Panel: Recent Labs  Lab 01/07/22 0734 01/08/22 0547 01/10/22 0425 01/11/22 0314 01/12/22 0441 01/12/22 0926 01/13/22 0532  NA 133*   < > 136 135 135 133* 135  K 5.6*   < > 4.5 5.1 6.0* 6.2* 5.3*  CL 96*   < > 98 98 97* 95* 96*  CO2 28   < > 30 30 28 27 30   GLUCOSE 80   < > 73 72  79 120* 85  BUN 73*   < > 58* 74* 92* 93* 61*  CREATININE 6.41*   < > 5.24* 5.83* 6.73* 7.03* 5.06*  CALCIUM 9.3   < > 9.1 9.2 9.5 9.7 9.4  PHOS 3.0  --   --   --   --  4.2  --    < > = values in this interval not displayed.     Liver Function Tests: Recent Labs  Lab 01/07/22 0734 01/12/22 0926  ALBUMIN 2.9* 3.1*    No results for input(s): "LIPASE", "AMYLASE" in the last 168 hours. No results for input(s): "AMMONIA" in the last 168 hours.  CBC: Recent Labs  Lab 01/07/22 0734 01/08/22 0547 01/10/22 0425 01/11/22 0314 01/12/22 0441 01/12/22 0926 01/13/22 0532  WBC 4.4   < > 5.3 4.9 5.1 4.7 4.4  NEUTROABS 2.9  --   --   --   --   --   --   HGB 7.3*   < > 7.5* 7.4* 8.0* 8.1* 8.2*  HCT 21.6*   < > 22.4* 22.4* 23.9* 24.1* 24.7*  MCV 98.6   < > 100.9* 102.3* 101.7* 100.0 101.2*  PLT 211   < > 206 221 246 246 223   < > = values in this interval not displayed.     Cardiac Enzymes: No results for input(s): "CKTOTAL", "CKMB", "CKMBINDEX", "TROPONINI" in the last 168 hours.  BNP: Invalid input(s): "POCBNP"  CBG: No results for  input(s): "GLUCAP" in the last 168 hours.   Microbiology: Results for orders placed or performed during the hospital encounter of 12/15/21  SARS Coronavirus 2 by RT PCR (hospital order, performed in [facility] hospital lab) *cepheid single result test* Anterior Nasal Swab     Status: None   Collection Time: 12/15/21  5:50 PM   Specimen: Anterior Nasal Swab  Result Value Ref Range Status   SARS Coronavirus 2 by RT PCR NEGATIVE NEGATIVE Final    Comment: (NOTE) SARS-CoV-2 target nucleic acids are NOT DETECTED.  The SARS-CoV-2 RNA is generally detectable in upper and lower respiratory specimens during the acute phase of infection. The lowest concentration of SARS-CoV-2 viral copies this assay can detect is 250 copies / mL. A negative result does not preclude SARS-CoV-2 infection and should not be used as the sole basis for treatment or  other patient management decisions.  A negative result may occur with improper specimen collection / handling, submission of specimen other than nasopharyngeal swab, presence of viral mutation(s) within the areas targeted by this assay, and inadequate number of viral copies (<250 copies / mL). A negative result must be combined with clinical observations, patient history, and epidemiological information.  Fact Sheet for Patients:   https://www.patel.info/  Fact Sheet for Healthcare Providers: https://hall.com/  This test is not yet approved or  cleared by the [place] FDA and has been authorized for detection and/or diagnosis of SARS-CoV-2 by FDA under an Emergency Use Authorization (EUA).  This EUA will remain in effect (meaning this test can be used) for the duration of the COVID-19 declaration under Section 564(b)(1) of the Act, 21 U.S.C. section 360bbb-3(b)(1), unless the authorization is terminated or revoked sooner.  Performed at [facility], [address] [place], [place] [address]   MRSA Next Gen by PCR, Nasal     Status: None   Collection Time: 12/15/21  9:03 PM   Specimen: Nasal Mucosa; Nasal Swab  Result Value Ref Range Status   MRSA by PCR Next Gen NOT DETECTED NOT DETECTED Final    Comment: (NOTE) The GeneXpert MRSA Assay (FDA approved for NASAL specimens only), is one component of a comprehensive MRSA colonization surveillance program. It is not intended to diagnose MRSA infection nor to guide or monitor treatment for MRSA infections. Test performance is not FDA approved in patients less than 51 years old. Performed at [facility], [address] [place], [place] [address]     Coagulation Studies: No results for input(s): "LABPROT", "INR" in the last 72 hours.  Urinalysis: No results for input(s): "COLORURINE", "LABSPEC", "PHURINE", "GLUCOSEU", "HGBUR", "BILIRUBINUR", "KETONESUR",  "PROTEINUR", "UROBILINOGEN", "NITRITE", "LEUKOCYTESUR" in the last 72 hours.  Invalid input(s): "APPERANCEUR"    Imaging: No results found.   Medications:    anticoagulant sodium citrate      (feeding supplement) PROSource Plus  30 mL Oral TID BM   amLODipine  10 mg Oral q morning   calcium acetate  667 mg Oral TID WC   carbamazepine  1,000 mg Oral q AM   Chlorhexidine Gluconate Cloth  6 each Topical Daily   Chlorhexidine Gluconate Cloth  6 each Topical Q0600   cloNIDine  0.2 mg Transdermal Weekly   doxazosin  4 mg Oral QHS   epoetin (EPOGEN/PROCRIT) injection  10,000 Units Intravenous Q M,W,F-HD   ferrous sulfate  325 mg Oral Q breakfast   fluPHENAZine  2.5 mg Oral q AM   heparin  5,000 Units Subcutaneous Q8H   hydrALAZINE  5 mg Oral TID   irbesartan  300 mg Oral Daily   isosorbide mononitrate  60 mg Oral BID   minoxidil  5 mg Oral Daily   mometasone-formoterol  2 puff Inhalation BID   multivitamin  1 tablet Oral QHS   pantoprazole  40 mg Oral Daily   patiromer  16.8 g Oral Daily   polyethylene glycol  17 g Oral BID   trihexyphenidyl  2 mg Oral BID WC   albuterol, alteplase, anticoagulant sodium citrate, diphenhydrAMINE, haloperidol lactate, heparin, hydrALAZINE, lidocaine (PF), lidocaine-prilocaine, ondansetron **OR** ondansetron (ZOFRAN) IV, mouth rinse, pentafluoroprop-tetrafluoroeth, polyethylene glycol, senna-docusate  Assessment/ Plan:  [name] is a 61 y.o.  male with past medical history including hypertension, anemia, schizophrenia and end-stage renal disease on hemodialysis.  Patient has presented to the ED with altered mental status.  Patient has been admitted for Altered mental status [R41.82] Hypertensive urgency [I16.0] Severe hypertension [I10] Altered mental status, unspecified altered mental status type [R41.82]  [facility]/MWF/Lt AVF  End-stage renal disease with hyperkalemia on hemodialysis.  Will maintain outpatient schedule if  possible.   Dialysis received yesterday, UF goal 2.5 L achieved.  Patient was receiving daily Lokelma for elevated potassium.  We will stop this and try Veltassa for improved efficacy.  Next dialysis treatment scheduled for Wednesday. Renal navigator monitoring discharge plan to determine changes in outpatient needs.     2. Anemia of chronic kidney disease Lab Results  Component Value Date   HGB 8.2 (L) 01/13/2022    Hemoglobin remains decreased.  Continue EPO with dialysis treatments.    3. Secondary Hyperparathyroidism:  Lab Results  Component Value Date   CALCIUM 9.4 01/13/2022   PHOS 4.2 01/12/2022   Calcium and phosphorus remain within acceptable range.  Calcium acetate restarted with meals.  4.  Hypertension with chronic kidney disease.  Home regimen includes amlodipine, clonidine, hydralazine, isosorbide, and losartan.    Currently on amlodipine 10 mg daily, clonidine patch 0.2 mg per 24 hours, Cardura 4 mg daily at bedtime, hydralazine 10 mg 3 times per day, Avapro 300 mg daily, isosorbide 60 mg twice a day, minoxidil 5 mg daily.  Blood pressure currently 169/75.    LOS: [place] 8/15/20231:25 PM

## 2022-01-13 NOTE — Progress Notes (Signed)
Occupational Therapy Treatment Patient Details Name: Todd Brown MRN: [id number] DOB: 08-26-1960 Today's Date: 01/13/2022   History of present illness 61 year old male with past medical history of hypertension and end-stage renal disease on hemodialysis presented to the emergency room from his group home/skilled nursing facility on 7/17 with confusion.  (Patient's baseline is that he is unable to care for himself and has a legal guardian.)  Patient last received dialysis on Friday, 7/14.  From being in the emergency room on 7/17, unable to get dialysis yesterday.  Patient found to have markedly elevated blood pressure with a systolic in the 425Z.  Patient with questionable cardiac ischemia as well   OT comments  Pt seen for OT tx this date. Pt in recliner, requesting food. Pt had items on his tray table in front of him but due to low vision impairments, unable to locate items easily. Pt educated in setup for tray attempting to incorporate low vision strategies to improve access to items on the tray. Pt required hand over hand with MAX multimodal cues to locate tray table and encourage repetition. Pt will benefit from additional repetition and similar tray set up in order to maximize carryover and routines building which will ultimately improve independence and safety with meals. Continue to recommend SNF upon discharge as well as continued skilled OT services to maximize safety/indep and minimize caregiver burden.    Recommendations for follow up therapy are one component of a multi-disciplinary discharge planning process, led by the attending physician.  Recommendations may be updated based on patient status, additional functional criteria and insurance authorization.    Follow Up Recommendations  Skilled nursing-short term rehab (<3 hours/day)    Assistance Recommended at Discharge Frequent or constant Supervision/Assistance  Patient can return home with the following  A lot of help with  bathing/dressing/bathroom;A lot of help with walking and/or transfers;Assistance with feeding;Assist for transportation;Direct supervision/assist for medications management;Direct supervision/assist for financial management;Assistance with cooking/housework;Help with stairs or ramp for entrance   Equipment Recommendations  Other (comment) (pet next venue)    Recommendations for Other Services      Precautions / Restrictions Precautions Precautions: Fall Precaution Comments: vision impaired       Mobility Bed Mobility                    Transfers                         Balance                                           ADL either performed or assessed with clinical judgement   ADL Overall ADL's : Needs assistance/impaired Eating/Feeding: Set up;Sitting;Supervision/ safety Eating/Feeding Details (indicate cue type and reason): Pt educated in setup for tray attempting to incorporate low vision strategies to improve access to items on the tray. Pt required initial hand over hand with MAX multimodal cues to locate tray table and encourage repetition. Pt will benefit from additional repetition and similar tray set up in order to maximize carryover and routines building which will ultimately improve independence and safety with meals.                                        Extremity/Trunk Assessment  Vision       Perception     Praxis      Cognition Arousal/Alertness: Awake/alert Behavior During Therapy: WFL for tasks assessed/performed Overall Cognitive Status: No family/caregiver present to determine baseline cognitive functioning                                 General Comments: pt requires cues for commands and repetition to support carryover        Exercises      Shoulder Instructions       General Comments      Pertinent Vitals/ Pain       Pain Assessment Pain Assessment:  No/denies pain  Home Living                                          Prior Functioning/Environment              Frequency  Min 2X/week        Progress Toward Goals  OT Goals(current goals can now be found in the care plan section)  Progress towards OT goals: Progressing toward goals  Acute Rehab OT Goals Patient Stated Goal: get better OT Goal Formulation: With patient  Plan Discharge plan remains appropriate;Frequency remains appropriate    Co-evaluation                 AM-PAC OT "6 Clicks" Daily Activity     Outcome Measure   Help from another person eating meals?: A Little Help from another person taking care of personal grooming?: A Little Help from another person toileting, which includes using toliet, bedpan, or urinal?: A Little Help from another person bathing (including washing, rinsing, drying)?: A Lot Help from another person to put on and taking off regular upper body clothing?: A Little Help from another person to put on and taking off regular lower body clothing?: A Lot 6 Click Score: 16    End of Session Equipment Utilized During Treatment: Oxygen  OT Visit Diagnosis: Unsteadiness on feet (R26.81);Muscle weakness (generalized) (M62.81)   Activity Tolerance Patient tolerated treatment well   Patient Left in chair;with call bell/phone within reach;with chair alarm set   Nurse Communication          Time: 7371-0626 OT Time Calculation (min): 22 min  Charges: OT General Charges $OT Visit: 1 Visit OT Treatments $Self Care/Home Management : 8-22 mins  Ardeth Perfect., MPH, MS, OTR/L ascom [phone] 01/13/22, 11:23 AM

## 2022-01-13 NOTE — TOC Progression Note (Signed)
Transition of Care [facility]) - Progression Note    Patient Details  Name: Todd Brown MRN: [id number] Date of Birth: 1960/09/28  Transition of Care [facility]) CM/SW [place], RN Phone Number: 01/13/2022, 4:41 PM  Clinical Narrative:   Rex Kras from [facility] stated to check with [facility] and [facility].  RNCM faxed admission information to both facilities, awaiting response.    Expected Discharge Plan: [place] Barriers to Discharge: Continued Medical Work up  Expected Discharge Plan and Services Expected Discharge Plan: [place] arrangements for the past 2 months: [place] Expected Discharge Date: 12/19/21                                     Social Determinants of Health (SDOH) Interventions    Readmission Risk Interventions     No data to display

## 2022-01-14 DIAGNOSIS — N186 End stage renal disease: Secondary | ICD-10-CM | POA: Diagnosis not present

## 2022-01-14 DIAGNOSIS — E875 Hyperkalemia: Secondary | ICD-10-CM | POA: Diagnosis not present

## 2022-01-14 DIAGNOSIS — G9341 Metabolic encephalopathy: Secondary | ICD-10-CM | POA: Diagnosis not present

## 2022-01-14 DIAGNOSIS — I10 Essential (primary) hypertension: Secondary | ICD-10-CM | POA: Diagnosis not present

## 2022-01-14 LAB — BASIC METABOLIC PANEL
Anion gap: 9 (ref 5–15)
BUN: 71 mg/dL — ABNORMAL HIGH (ref 8–23)
CO2: 28 mmol/L (ref 22–32)
Calcium: 9.3 mg/dL (ref 8.9–10.3)
Chloride: 98 mmol/L (ref 98–111)
Creatinine, Ser: 5.93 mg/dL — ABNORMAL HIGH (ref 0.61–1.24)
GFR, Estimated: 10 mL/min — ABNORMAL LOW (ref >60)
Glucose, Bld: 71 mg/dL (ref 70–99)
Potassium: 6 mmol/L — ABNORMAL HIGH (ref 3.5–5.1)
Sodium: 135 mmol/L (ref 135–145)

## 2022-01-14 LAB — CBC
HCT: 26.6 % — ABNORMAL LOW (ref 39.0–52.0)
Hemoglobin: 8.9 g/dL — ABNORMAL LOW (ref 13.0–17.0)
MCH: 34.4 pg — ABNORMAL HIGH (ref 26.0–34.0)
MCHC: 33.5 g/dL (ref 30.0–36.0)
MCV: 102.7 fL — ABNORMAL HIGH (ref 80.0–100.0)
Platelets: 210 10*3/uL (ref 150–400)
RBC: 2.59 MIL/uL — ABNORMAL LOW (ref 4.22–5.81)
RDW: 17.4 % — ABNORMAL HIGH (ref 11.5–15.5)
WBC: 5.9 10*3/uL (ref 4.0–10.5)
nRBC: 0 % (ref 0.0–0.2)

## 2022-01-14 LAB — HEPATITIS B CORE ANTIBODY, IGM: Hep B C IgM: NONREACTIVE

## 2022-01-14 LAB — HEPATITIS B SURFACE ANTIGEN: Hepatitis B Surface Ag: NONREACTIVE

## 2022-01-14 LAB — GLUCOSE, CAPILLARY: Glucose-Capillary: 117 mg/dL — ABNORMAL HIGH (ref 70–99)

## 2022-01-14 LAB — HEPATITIS B CORE ANTIBODY, TOTAL: Hep B Core Total Ab: NONREACTIVE

## 2022-01-14 MED ORDER — EPOETIN ALFA 10000 UNIT/ML IJ SOLN
INTRAMUSCULAR | Status: AC
  Administered 2022-01-14: 10000 [IU] via INTRAVENOUS
  Filled 2022-01-14: qty 1

## 2022-01-14 MED ORDER — MINOXIDIL 10 MG PO TABS
10.0000 mg | ORAL_TABLET | Freq: Every day | ORAL | Status: DC
  Administered 2022-01-15 – 2022-02-03 (×20): 10 mg via ORAL
  Filled 2022-01-14: qty 1
  Filled 2022-01-14 (×2): qty 4
  Filled 2022-01-14 (×2): qty 1
  Filled 2022-01-14: qty 4
  Filled 2022-01-14: qty 1
  Filled 2022-01-14: qty 4
  Filled 2022-01-14: qty 1
  Filled 2022-01-14: qty 4
  Filled 2022-01-14 (×14): qty 1

## 2022-01-14 MED ORDER — ORAL CARE MOUTH RINSE
15.0000 mL | OROMUCOSAL | Status: DC
  Administered 2022-01-14 – 2022-02-22 (×128): 15 mL via OROMUCOSAL

## 2022-01-14 NOTE — Progress Notes (Signed)
Hd tx of 3.5hrs completed. 84L total vol processed. No complications noted. Report given to [employer], rn.  Total uf removed: 205ml Post hd wt: 86.7kg Post hd v/s: 97.9 156/79(101) 65 12 95%

## 2022-01-14 NOTE — TOC Progression Note (Signed)
Transition of Care [facility]) - Progression Note    Patient Details  Name: Todd Brown MRN: [id number] Date of Birth: 1960-12-30  Transition of Care [facility]) CM/SW [place], [place] Phone Number: 01/14/2022, 2:53 PM  Clinical Narrative:     Gwenyth Allegra , [facility] reports they are unable to accept patient at this time.   Expected Discharge Plan: [place] Barriers to Discharge: Continued Medical Work up  Expected Discharge Plan and Services Expected Discharge Plan: [place] arrangements for the past 2 months: [place] Expected Discharge Date: 12/19/21                                     Social Determinants of Health (SDOH) Interventions    Readmission Risk Interventions     No data to display

## 2022-01-14 NOTE — Assessment & Plan Note (Addendum)
Potassiums have been trending upward for the past few days and patient started on Veltassa.  After dialysis on 8/28, potassium on 8/29 at 4.5.  Continue to follow.

## 2022-01-14 NOTE — Progress Notes (Signed)
[facility]  ROUNDING NOTE   Subjective:   Todd Brown is a 60 year old male with past medical history including hypertension, anemia, schizophrenia and end-stage renal disease on hemodialysis.  Patient has presented to the ED with altered mental status.  Patient has been admitted for Altered mental status [R41.82] Hypertensive urgency [I16.0] Severe hypertension [I10] Altered mental status, unspecified altered mental status type [R41.82]  Patient is known to our practice and receives outpatient dialysis treatments at [facility] on a MWF schedule.  Update: Patient seen and evaluated during dialysis   HEMODIALYSIS FLOWSHEET:  Blood Flow Rate (mL/min): 400 mL/min Arterial Pressure (mmHg): -120 mmHg Venous Pressure (mmHg): 280 mmHg TMP (mmHg): 6 mmHg Ultrafiltration Rate (mL/min): 829 mL/min Dialysate Flow Rate (mL/min): 300 ml/min Dialysis Fluid Bolus: Normal Saline Bolus Amount (mL): 300 mL  Denies pain or discomfort Requesting meal tray   Objective:  Vital signs in last 24 hours:  Temp:  [97.6 F (36.4 C)-98.9 F (37.2 C)] 98.9 F (37.2 C) (08/16 0830) Pulse Rate:  [61-77] 66 (08/16 1135) Resp:  [11-19] 13 (08/16 1135) BP: (120-205)/(53-95) 134/53 (08/16 1135) SpO2:  [94 %-100 %] 99 % (08/16 1135)  Weight change:  Filed Weights   01/09/22 1240 01/12/22 0814 01/12/22 1255  Weight: 85.7 kg 91.2 kg 88.7 kg    Intake/Output: I/O last 3 completed shifts: In: 120 [P.O.:120] Out: -    Intake/Output this shift:  No intake/output data recorded.  Physical Exam: General: NAD  Head: Normocephalic, atraumatic. Moist oral mucosal membranes  Eyes: Anicteric  Lungs:  Clear to auscultation, normal effort, room air  Heart: Regular rate and rhythm  Abdomen:  Soft, nontender, non distended  Extremities:  trace peripheral edema.  Neurologic: Alert, answer simple questions  Skin: No lesions  Access: Lt AVF     Basic Metabolic Panel: Recent Labs   Lab 01/11/22 0314 01/12/22 0441 01/12/22 0926 01/13/22 0532 01/14/22 0507  NA 135 135 133* 135 135  K 5.1 6.0* 6.2* 5.3* 6.0*  CL 98 97* 95* 96* 98  CO2 30 28 27 30 28   GLUCOSE 72 79 120* 85 71  BUN 74* 92* 93* 61* 71*  CREATININE 5.83* 6.73* 7.03* 5.06* 5.93*  CALCIUM 9.2 9.5 9.7 9.4 9.3  PHOS  --   --  4.2  --   --      Liver Function Tests: Recent Labs  Lab 01/12/22 0926  ALBUMIN 3.1*    No results for input(s): "LIPASE", "AMYLASE" in the last 168 hours. No results for input(s): "AMMONIA" in the last 168 hours.  CBC: Recent Labs  Lab 01/11/22 0314 01/12/22 0441 01/12/22 0926 01/13/22 0532 01/14/22 0507  WBC 4.9 5.1 4.7 4.4 5.9  HGB 7.4* 8.0* 8.1* 8.2* 8.9*  HCT 22.4* 23.9* 24.1* 24.7* 26.6*  MCV 102.3* 101.7* 100.0 101.2* 102.7*  PLT 221 246 246 223 210     Cardiac Enzymes: No results for input(s): "CKTOTAL", "CKMB", "CKMBINDEX", "TROPONINI" in the last 168 hours.  BNP: Invalid input(s): "POCBNP"  CBG: No results for input(s): "GLUCAP" in the last 168 hours.   Microbiology: Results for orders placed or performed during the hospital encounter of 12/15/21  SARS Coronavirus 2 by RT PCR (hospital order, performed in [facility] hospital lab) *cepheid single result test* Anterior Nasal Swab     Status: None   Collection Time: 12/15/21  5:50 PM   Specimen: Anterior Nasal Swab  Result Value Ref Range Status   SARS Coronavirus 2 by RT PCR NEGATIVE NEGATIVE  Final    Comment: (NOTE) SARS-CoV-2 target nucleic acids are NOT DETECTED.  The SARS-CoV-2 RNA is generally detectable in upper and lower respiratory specimens during the acute phase of infection. The lowest concentration of SARS-CoV-2 viral copies this assay can detect is 250 copies / mL. A negative result does not preclude SARS-CoV-2 infection and should not be used as the sole basis for treatment or other patient management decisions.  A negative result may occur with improper specimen  collection / handling, submission of specimen other than nasopharyngeal swab, presence of viral mutation(s) within the areas targeted by this assay, and inadequate number of viral copies (<250 copies / mL). A negative result must be combined with clinical observations, patient history, and epidemiological information.  Fact Sheet for Patients:   https://www.patel.info/  Fact Sheet for Healthcare Providers: https://hall.com/  This test is not yet approved or  cleared by the [place] FDA and has been authorized for detection and/or diagnosis of SARS-CoV-2 by FDA under an Emergency Use Authorization (EUA).  This EUA will remain in effect (meaning this test can be used) for the duration of the COVID-19 declaration under Section 564(b)(1) of the Act, 21 U.S.C. section 360bbb-3(b)(1), unless the authorization is terminated or revoked sooner.  Performed at [facility], [address] [place], [place] [address]   MRSA Next Gen by PCR, Nasal     Status: None   Collection Time: 12/15/21  9:03 PM   Specimen: Nasal Mucosa; Nasal Swab  Result Value Ref Range Status   MRSA by PCR Next Gen NOT DETECTED NOT DETECTED Final    Comment: (NOTE) The GeneXpert MRSA Assay (FDA approved for NASAL specimens only), is one component of a comprehensive MRSA colonization surveillance program. It is not intended to diagnose MRSA infection nor to guide or monitor treatment for MRSA infections. Test performance is not FDA approved in patients less than 64 years old. Performed at [facility], [address] [place], [place] [address]     Coagulation Studies: No results for input(s): "LABPROT", "INR" in the last 72 hours.  Urinalysis: No results for input(s): "COLORURINE", "LABSPEC", "PHURINE", "GLUCOSEU", "HGBUR", "BILIRUBINUR", "KETONESUR", "PROTEINUR", "UROBILINOGEN", "NITRITE", "LEUKOCYTESUR" in the last 72 hours.  Invalid  input(s): "APPERANCEUR"    Imaging: No results found.   Medications:    anticoagulant sodium citrate      (feeding supplement) PROSource Plus  30 mL Oral TID BM   amLODipine  10 mg Oral q morning   calcium acetate  667 mg Oral TID WC   carbamazepine  1,000 mg Oral q AM   Chlorhexidine Gluconate Cloth  6 each Topical Daily   Chlorhexidine Gluconate Cloth  6 each Topical Q0600   cloNIDine  0.2 mg Transdermal Weekly   doxazosin  4 mg Oral QHS   epoetin (EPOGEN/PROCRIT) injection  10,000 Units Intravenous Q M,W,F-HD   ferrous sulfate  325 mg Oral Q breakfast   fluPHENAZine  2.5 mg Oral q AM   heparin  5,000 Units Subcutaneous Q8H   hydrALAZINE  5 mg Oral TID   irbesartan  300 mg Oral Daily   isosorbide mononitrate  60 mg Oral BID   minoxidil  5 mg Oral Daily   mometasone-formoterol  2 puff Inhalation BID   multivitamin  1 tablet Oral QHS   mouth rinse  15 mL Mouth Rinse 4 times per day   pantoprazole  40 mg Oral Daily   patiromer  16.8 g Oral Daily   polyethylene glycol  17 g Oral BID   trihexyphenidyl  2 mg Oral BID WC   albuterol, alteplase, anticoagulant sodium citrate, diphenhydrAMINE, haloperidol lactate, heparin, hydrALAZINE, lidocaine (PF), lidocaine-prilocaine, ondansetron **OR** ondansetron (ZOFRAN) IV, mouth rinse, pentafluoroprop-tetrafluoroeth, polyethylene glycol, senna-docusate  Assessment/ Plan:  [name] is a 61 y.o.  male with past medical history including hypertension, anemia, schizophrenia and end-stage renal disease on hemodialysis.  Patient has presented to the ED with altered mental status.  Patient has been admitted for Altered mental status [R41.82] Hypertensive urgency [I16.0] Severe hypertension [I10] Altered mental status, unspecified altered mental status type [R41.82]  [facility]/MWF/Lt AVF  End-stage renal disease with hyperkalemia on hemodialysis.  Will maintain outpatient schedule if possible.   Receiving dialysis now, UF  goal 1.5-2L as tolerated.  Next treatment scheduled for Friday. Renal navigator monitoring discharge plan to determine changes in outpatient needs.     2. Anemia of chronic kidney disease Lab Results  Component Value Date   HGB 8.9 (L) 01/14/2022    Hemoglobin remains decreased.  Continue EPO with dialysis treatments.    3. Secondary Hyperparathyroidism:  Lab Results  Component Value Date   CALCIUM 9.3 01/14/2022   PHOS 4.2 01/12/2022   We will continue to monitor bone minerals during this admission.  Calcium acetate restarted with meals.  4.  Hypertension with chronic kidney disease.  Home regimen includes amlodipine, clonidine, hydralazine, isosorbide, and losartan.    Currently on amlodipine 10 mg daily, clonidine patch 0.2 mg per 24 hours, Cardura 4 mg daily at bedtime, hydralazine 10 mg 3 times per day, Avapro 300 mg daily, isosorbide 60 mg twice a day, minoxidil 5 mg daily.  Blood pressure 166/76 during dialysis    LOS: 30   8/16/202312:01 PM

## 2022-01-14 NOTE — Progress Notes (Addendum)
[facility] Progress Note  Patient: Todd Brown    NWG:[id number]  DOA: 12/15/2021    Date of Service: the patient was seen and examined on 01/14/2022  Brief hospital course: 61 year old male with past medical history of hypertension and end-stage renal disease on hemodialysis presented to the emergency room from his group home/Assisted living facility on 7/17 with confusion.  (Patient's baseline was that he is unable to care for himself and has a legal guardian.)  Patient last received dialysis on Friday, 7/14.  Patient found to have markedly elevated blood pressure with a systolic in the 578I requiring Cardene drip and placement of patient to the ICU.  Patient with questionable cardiac ischemia as well.  CT scan of head and neurological exam unremarkable.  Patient was admitted to the hospitalist service and started on his oral antihypertensives, nicardipine drip and given IV labetalol and as needed IV hydralazine.  Nephrology consulted and with plans to resume patient's dialysis.  Over the next few weeks, blood pressure better controlled.  At times became agitated and seen by psychiatry and started on scheduled and as needed Haldol, leading to more compliance with better mentation as well as taking his antihypertensives.  Hospital course complicated by episode of periorbital swelling bilaterally felt to be secondary to subcutaneous emphysema, resolved on steroids.  Also had episode of oral thrush treated with Magic mouthwash.  Initially patient unable to go back to his assisted living facility due to weakness, but then after working with physical therapy, patient was declined by his group home and now Case management looking at other placement options.    Assessment and Plan: * Acute metabolic encephalopathy-resolved as of 12/31/2021 Felt to be secondary to malignant hypertension.  Resolved and now back at baseline.  Had some increased somnolence which improved after stopping his scheduled  Benadryl and Haldol.  Hypertensive emergency-resolved as of 12/31/2021 Resolved.  See below.  ESRD on dialysis [facility]) Nephrology consulted, full dialysis session following admission and again today to keep him on his Monday Wednesday Friday schedule. -Continue scheduled dialysis  Mental health disorder Unspecified.  Patient is unable to care for himself.  It is unclear if he is ever had a formal diagnosis.  He is on Tegretol and Artane which he should continue.  As needed Haldol for acute behavior agitation as above.  He is for the most part much more calm and cooperative these days over times, he intermittently refused vitals being checked or taking his medications.  Essential hypertension Resumed on his home p.o. amlodipine 10 mg daily, clonidine 0.3 mg p.o. 3 times daily, hydralazine 100 mg p.o. 3 times daily, isosorbide mononitrate 60 mg p.o. twice daily, losartan 100 mg daily.  Pressures at time improved, but then blood pressure starts to spike again.  As above, changing ARB and adding alpha-blocker. -Nephrology added clonidine patches with p.o. clonidine. -Minoxidil was then added and titrated up to 10 mg daily.  During the first week of August, patient occasionally had episodes of lower blood pressure without any blood pressure spikes, so some of his newer medications were decreased: hydralazine down to 5 mg TID and minoxidil from 10 mg to 5 mg.  Blood pressures in the last few days have been steadily increasing, so we will reverse some of the changes above made.  Oral thrush-resolved as of 01/02/2022 Improved with Magic mouthwash  Subcutaneous emphysema (HCC)-resolved as of 12/18/2021 On steroids.  Appreciate pulmonary assistance.  Subcutaneous emphysema looks to be resolved.  Changed from Solu-Medrol to p.o.  prednisone quick taper.  Physical deconditioning PT OT are recommending SNF.  However, this is similar to a previous hospitalization where TOC was unable to find skilled nursing and  over time, patient's strength improved that he was able to be discharged back to his assisted living facility.  Hyperkalemia Potassiums have been trending upward for the last 2 days and patient started on Veltassa.  Currently at 6.0.  Recheck in the morning  Malnutrition of moderate degree Nutrition Status: Nutrition Problem: Moderate Malnutrition Etiology: chronic illness (ESRD on HD) Signs/Symptoms: mild fat depletion, mild muscle depletion, moderate muscle depletion Interventions: Refer to RD note for recommendations, Nepro shake, MVI, Prostat       Body mass index is 25.92 kg/m.  Nutrition Problem: Moderate Malnutrition Etiology: chronic illness (ESRD on HD)     Consultants: Nephrology Critical care Psychiatry  Procedures: Hemodialysis   Antimicrobials: None  Code Status: Full code   Subjective: Patient with no complaints  Objective: Blood pressures have remained stable Vitals:   01/14/22 1211 01/14/22 1217  BP: (!) 163/76 (!) 156/79  Pulse: 63 65  Resp: 13 12  Temp: 97.9 F (36.6 C) 97.9 F (36.6 C)  SpO2: 97% 95%   No intake or output data in the 24 hours ending 01/14/22 1422    Filed Weights   01/12/22 0814 01/12/22 1255 01/14/22 1211  Weight: 91.2 kg 88.7 kg 86.7 kg   Body mass index is 25.92 kg/m.  Exam:  General: Remains quiet, minimal interactions HEENT: Normocephalic and atraumatic, mucous membranes moist Cardiovascular: Regular rate and rhythm, S1-S2, 2 out of 6 systolic ejection murmur Respiratory: Clear to auscultation bilaterally Abdomen: Soft, nontender, nondistended, hypoactive bowel sounds Musculoskeletal: No clubbing or cyanosis or edema Skin: No skin breaks, tears or lesions Psychiatry: Appropriate, no evidence of psychoses, flattened affect Neurology: Difficult to assess secondary to compliance  Data Reviewed: No labs today but data sent today at 6.0.  Hemoglobin stable at 8.9. Disposition:  Status is:  Inpatient Remains inpatient appropriate because:  -Treating hyperkalemia -Looking for new placement options  Anticipated discharge date: 8/20  Family Communication: No family, message left for guardian DVT Prophylaxis: heparin injection 5,000 Units Start: 12/15/21 1645 Place TED hose Start: 12/15/21 1629    Author: Annita Brod ,MD 01/14/2022 2:22 PM  To reach On-call, see care teams to locate the attending and reach out via [email or web address]. Between 7PM-7AM, please contact night-coverage If you still have difficulty reaching the attending provider, please page the [facility] (Director on Call) for [facility] on amion for assistance.

## 2022-01-15 DIAGNOSIS — N186 End stage renal disease: Secondary | ICD-10-CM | POA: Diagnosis not present

## 2022-01-15 DIAGNOSIS — G9341 Metabolic encephalopathy: Secondary | ICD-10-CM | POA: Diagnosis not present

## 2022-01-15 DIAGNOSIS — I10 Essential (primary) hypertension: Secondary | ICD-10-CM | POA: Diagnosis not present

## 2022-01-15 DIAGNOSIS — E875 Hyperkalemia: Secondary | ICD-10-CM | POA: Diagnosis not present

## 2022-01-15 LAB — BASIC METABOLIC PANEL
Anion gap: 10 (ref 5–15)
BUN: 57 mg/dL — ABNORMAL HIGH (ref 8–23)
CO2: 29 mmol/L (ref 22–32)
Calcium: 8.9 mg/dL (ref 8.9–10.3)
Chloride: 98 mmol/L (ref 98–111)
Creatinine, Ser: 4.86 mg/dL — ABNORMAL HIGH (ref 0.61–1.24)
GFR, Estimated: 13 mL/min — ABNORMAL LOW (ref >60)
Glucose, Bld: 74 mg/dL (ref 70–99)
Potassium: 5.1 mmol/L (ref 3.5–5.1)
Sodium: 137 mmol/L (ref 135–145)

## 2022-01-15 LAB — HEPATITIS B SURFACE ANTIBODY, QUANTITATIVE: Hep B S AB Quant (Post): 55.5 m[IU]/mL (ref >9.9)

## 2022-01-15 MED ORDER — HYDRALAZINE HCL 10 MG PO TABS
10.0000 mg | ORAL_TABLET | Freq: Three times a day (TID) | ORAL | Status: DC
  Administered 2022-01-15 – 2022-01-24 (×25): 10 mg via ORAL
  Filled 2022-01-15 (×27): qty 1

## 2022-01-15 NOTE — Progress Notes (Signed)
[facility]  ROUNDING NOTE   Subjective:   Todd Brown is a 61 year old male with past medical history including hypertension, anemia, schizophrenia and end-stage renal disease on hemodialysis.  Patient has presented to the ED with altered mental status.  Patient has been admitted for Altered mental status [R41.82] Hypertensive urgency [I16.0] Severe hypertension [I10] Altered mental status, unspecified altered mental status type [R41.82]  Patient is known to our practice and receives outpatient dialysis treatments at [facility] on a MWF schedule.  Update: Patient seen sitting up in chair Completed breakfast tray at bedside Denies pain and discomfort Denies shortness of breath   Objective:  Vital signs in last 24 hours:  Temp:  [98 F (36.7 C)-98.7 F (37.1 C)] 98.7 F (37.1 C) (08/17 0833) Pulse Rate:  [65-77] 65 (08/17 0517) Resp:  [12-19] 19 (08/17 0517) BP: (143-170)/(76-82) 170/79 (08/17 0833) SpO2:  [96 %-98 %] 96 % (08/17 1119)  Weight change:  Filed Weights   01/12/22 0814 01/12/22 1255 01/14/22 1211  Weight: 91.2 kg 88.7 kg 86.7 kg    Intake/Output: No intake/output data recorded.   Intake/Output this shift:  No intake/output data recorded.  Physical Exam: General: NAD  Head: Normocephalic, atraumatic. Moist oral mucosal membranes  Eyes: Anicteric  Lungs:  Clear to auscultation, normal effort, room air  Heart: Regular rate and rhythm  Abdomen:  Soft, nontender, non distended  Extremities:  trace peripheral edema.  Neurologic: Alert, answer simple questions  Skin: No lesions  Access: Lt AVF     Basic Metabolic Panel: Recent Labs  Lab 01/12/22 0441 01/12/22 0926 01/13/22 0532 01/14/22 0507 01/15/22 0411  NA 135 133* 135 135 137  K 6.0* 6.2* 5.3* 6.0* 5.1  CL 97* 95* 96* 98 98  CO2 28 27 30 28 29   GLUCOSE 79 120* 85 71 74  BUN 92* 93* 61* 71* 57*  CREATININE 6.73* 7.03* 5.06* 5.93* 4.86*  CALCIUM 9.5 9.7 9.4 9.3 8.9   PHOS  --  4.2  --   --   --      Liver Function Tests: Recent Labs  Lab 01/12/22 0926  ALBUMIN 3.1*    No results for input(s): "LIPASE", "AMYLASE" in the last 168 hours. No results for input(s): "AMMONIA" in the last 168 hours.  CBC: Recent Labs  Lab 01/11/22 0314 01/12/22 0441 01/12/22 0926 01/13/22 0532 01/14/22 0507  WBC 4.9 5.1 4.7 4.4 5.9  HGB 7.4* 8.0* 8.1* 8.2* 8.9*  HCT 22.4* 23.9* 24.1* 24.7* 26.6*  MCV 102.3* 101.7* 100.0 101.2* 102.7*  PLT 221 246 246 223 210     Cardiac Enzymes: No results for input(s): "CKTOTAL", "CKMB", "CKMBINDEX", "TROPONINI" in the last 168 hours.  BNP: Invalid input(s): "POCBNP"  CBG: Recent Labs  Lab 01/14/22 2047  GLUCAP 117*     Microbiology: Results for orders placed or performed during the hospital encounter of 12/15/21  SARS Coronavirus 2 by RT PCR (hospital order, performed in [facility] hospital lab) *cepheid single result test* Anterior Nasal Swab     Status: None   Collection Time: 12/15/21  5:50 PM   Specimen: Anterior Nasal Swab  Result Value Ref Range Status   SARS Coronavirus 2 by RT PCR NEGATIVE NEGATIVE Final    Comment: (NOTE) SARS-CoV-2 target nucleic acids are NOT DETECTED.  The SARS-CoV-2 RNA is generally detectable in upper and lower respiratory specimens during the acute phase of infection. The lowest concentration of SARS-CoV-2 viral copies this assay can detect is 250 copies /  mL. A negative result does not preclude SARS-CoV-2 infection and should not be used as the sole basis for treatment or other patient management decisions.  A negative result may occur with improper specimen collection / handling, submission of specimen other than nasopharyngeal swab, presence of viral mutation(s) within the areas targeted by this assay, and inadequate number of viral copies (<250 copies / mL). A negative result must be combined with clinical observations, patient history, and epidemiological  information.  Fact Sheet for Patients:   https://www.patel.info/  Fact Sheet for Healthcare Providers: https://hall.com/  This test is not yet approved or  cleared by the [place] FDA and has been authorized for detection and/or diagnosis of SARS-CoV-2 by FDA under an Emergency Use Authorization (EUA).  This EUA will remain in effect (meaning this test can be used) for the duration of the COVID-19 declaration under Section 564(b)(1) of the Act, 21 U.S.C. section 360bbb-3(b)(1), unless the authorization is terminated or revoked sooner.  Performed at [facility], [address] [place], [place] [address]   MRSA Next Gen by PCR, Nasal     Status: None   Collection Time: 12/15/21  9:03 PM   Specimen: Nasal Mucosa; Nasal Swab  Result Value Ref Range Status   MRSA by PCR Next Gen NOT DETECTED NOT DETECTED Final    Comment: (NOTE) The GeneXpert MRSA Assay (FDA approved for NASAL specimens only), is one component of a comprehensive MRSA colonization surveillance program. It is not intended to diagnose MRSA infection nor to guide or monitor treatment for MRSA infections. Test performance is not FDA approved in patients less than 61 years old. Performed at [facility], [address] [place], [place] [address]     Coagulation Studies: No results for input(s): "LABPROT", "INR" in the last 72 hours.  Urinalysis: No results for input(s): "COLORURINE", "LABSPEC", "PHURINE", "GLUCOSEU", "HGBUR", "BILIRUBINUR", "KETONESUR", "PROTEINUR", "UROBILINOGEN", "NITRITE", "LEUKOCYTESUR" in the last 72 hours.  Invalid input(s): "APPERANCEUR"    Imaging: No results found.   Medications:    anticoagulant sodium citrate      (feeding supplement) PROSource Plus  30 mL Oral TID BM   amLODipine  10 mg Oral q morning   calcium acetate  667 mg Oral TID WC   carbamazepine  1,000 mg Oral q AM   Chlorhexidine Gluconate Cloth  6  each Topical Daily   Chlorhexidine Gluconate Cloth  6 each Topical Q0600   cloNIDine  0.2 mg Transdermal Weekly   doxazosin  4 mg Oral QHS   epoetin (EPOGEN/PROCRIT) injection  10,000 Units Intravenous Q M,W,F-HD   ferrous sulfate  325 mg Oral Q breakfast   fluPHENAZine  2.5 mg Oral q AM   heparin  5,000 Units Subcutaneous Q8H   hydrALAZINE  10 mg Oral TID   irbesartan  300 mg Oral Daily   isosorbide mononitrate  60 mg Oral BID   minoxidil  10 mg Oral Daily   mometasone-formoterol  2 puff Inhalation BID   multivitamin  1 tablet Oral QHS   mouth rinse  15 mL Mouth Rinse 4 times per day   pantoprazole  40 mg Oral Daily   patiromer  16.8 g Oral Daily   polyethylene glycol  17 g Oral BID   trihexyphenidyl  2 mg Oral BID WC   albuterol, alteplase, anticoagulant sodium citrate, diphenhydrAMINE, haloperidol lactate, heparin, hydrALAZINE, lidocaine (PF), lidocaine-prilocaine, ondansetron **OR** ondansetron (ZOFRAN) IV, mouth rinse, pentafluoroprop-tetrafluoroeth, polyethylene glycol, senna-docusate  Assessment/ Plan:  [name] is  a 61 y.o.  male with past medical history including hypertension, anemia, schizophrenia and end-stage renal disease on hemodialysis.  Patient has presented to the ED with altered mental status.  Patient has been admitted for Altered mental status [R41.82] Hypertensive urgency [I16.0] Severe hypertension [I10] Altered mental status, unspecified altered mental status type [R41.82]  [facility] [place]WF/Lt AVF  End-stage renal disease with hyperkalemia on hemodialysis.  Will maintain outpatient schedule if possible.   Dialysis received yesterday, UF 2L achieved.  Next treatment for Friday.  Monitoring discharge plan to include SNF placement.     2. Anemia of chronic kidney disease Lab Results  Component Value Date   HGB 8.9 (L) 01/14/2022    Hemoglobin below desired target. continue EPO with dialysis treatments.    3. Secondary  Hyperparathyroidism:  Lab Results  Component Value Date   CALCIUM 8.9 01/15/2022   PHOS 4.2 01/12/2022   Calcium and phosphorus remain within acceptable range.  Calcium acetate restarted with meals.  4.  Hypertension with chronic kidney disease.  Home regimen includes amlodipine, clonidine, hydralazine, isosorbide, and losartan.    Currently on amlodipine 10 mg daily, clonidine patch 0.2 mg per 24 hours, Cardura 4 mg daily at bedtime, hydralazine 10 mg 3 times per day, Avapro 300 mg daily, isosorbide 60 mg twice a day, minoxidil 5 mg daily.  Blood pressure 170/70.    LOS: [place] 8/17/202312:43 PM

## 2022-01-15 NOTE — Progress Notes (Signed)
Physical Therapy Treatment Patient Details Name: Todd Brown MRN: [id number] DOB: September 15, 1960 Today's Date: 01/15/2022   History of Present Illness 61 year old male with past medical history of hypertension and end-stage renal disease on hemodialysis presented to the emergency room from his group home/skilled nursing facility on 7/17 with confusion.  (Patient's baseline is that he is unable to care for himself and has a legal guardian.)  Patient last received dialysis on Friday, 7/14.  From being in the emergency room on 7/17, unable to get dialysis yesterday.  Patient found to have markedly elevated blood pressure with a systolic in the 782N.  Patient with questionable cardiac ischemia as well    PT Comments    Pt was sitting in recliner upon arriving. He endorsing wanting a cheeseburger however remains on dysphasia diet. Pt is disoriented but was able to follow simple commands inconsistently. He required increased assistance overall to stand today. Author questions pt's effort throughout. He did stand 3 x but was unsafe to advance to gait at this time. Acute PT will continue to follow and progress as able per current POC.    Recommendations for follow up therapy are one component of a multi-disciplinary discharge planning process, led by the attending physician.  Recommendations may be updated based on patient status, additional functional criteria and insurance authorization.  Follow Up Recommendations  Skilled nursing-short term rehab (<3 hours/day)     Assistance Recommended at Discharge Intermittent Supervision/Assistance  Patient can return home with the following A lot of help with walking and/or transfers;A lot of help with bathing/dressing/bathroom;Assist for transportation   Equipment Recommendations  Rolling walker (2 wheels);BSC/3in1;Wheelchair cushion (measurements PT);Wheelchair (measurements PT)       Precautions / Restrictions Precautions Precautions: Fall Precaution  Comments: vision impaired Restrictions Weight Bearing Restrictions: No RLE Weight Bearing: Weight bearing as tolerated     Mobility  Bed Mobility  General bed mobility comments: In recliner pre/post session    Transfers Overall transfer level: Needs assistance Equipment used: Rolling walker (2 wheels) Transfers: Sit to/from Stand Sit to Stand: Mod assist, Max assist   Step pivot transfers: Mod assist, Max assist       General transfer comment: pt needed increased assistance to stand today. Author questions pt's effort + cognition greatly impacts participation. attempted standing 3 x but only able to fully stand 1 x with max vcs and tactle cues for handplacement, fwd wt shift, and overall improved safety. pt is slightly impulsive in standing    Ambulation/Gait  General Gait Details: Did not progress session to ambulation due to safety concerns in static standing.   Balance Overall balance assessment: Needs assistance Sitting-balance support: Feet supported Sitting balance-[name]: Fair     Standing balance support: Bilateral upper extremity supported Standing balance-[name]: Poor       Cognition Arousal/Alertness: Awake/alert Behavior During Therapy: Flat affect Overall Cognitive Status: No family/caregiver present to determine baseline cognitive functioning Area of Impairment: Orientation, Attention, Memory, Following commands, Safety/judgement, Awareness, Problem solving    Current Attention Level: Alternating Memory: Decreased short-term memory Following Commands: Follows one step commands inconsistently Safety/Judgement: Decreased awareness of safety, Decreased awareness of deficits Awareness: Intellectual Problem Solving: Slow processing, Decreased initiation, Difficulty sequencing, Requires verbal cues, Requires tactile cues (very decreased initiation) General Comments: pt is alert however disoriented and visually impaired        Exercises General  Exercises - Lower Extremity Long Arc Quad: AROM, 10 reps        Pertinent Vitals/Pain Pain Assessment Pain  Assessment: No/denies pain Pain Score: 0-No pain Pain Location: generalized     PT Goals (current goals can now be found in the care plan section) Acute Rehab PT Goals Patient Stated Goal: none stated Progress towards PT goals: Progressing toward goals    Frequency    Min 2X/week      PT Plan Current plan remains appropriate    Co-evaluation     PT goals addressed during session: Mobility/safety with mobility;Balance;Proper use of DME;Strengthening/ROM        AM-PAC PT "6 Clicks" Mobility   Outcome Measure  Help needed turning from your back to your side while in a flat bed without using bedrails?: A Little Help needed moving from lying on your back to sitting on the side of a flat bed without using bedrails?: A Little Help needed moving to and from a bed to a chair (including a wheelchair)?: A Lot Help needed standing up from a chair using your arms (e.g., wheelchair or bedside chair)?: A Lot Help needed to walk in hospital room?: A Lot Help needed climbing 3-5 steps with a railing? : Total 6 Click Score: 13    End of Session Equipment Utilized During Treatment: Gait belt Activity Tolerance: Other (comment) (limited by cognition) Patient left: in chair;with chair alarm set Nurse Communication: Mobility status PT Visit Diagnosis: Other abnormalities of gait and mobility (R26.89);Muscle weakness (generalized) (M62.81);Unsteadiness on feet (R26.81)     Time: 1245-8099 PT Time Calculation (min) (ACUTE ONLY): 9 min  Charges:  $Therapeutic Activity: 8-22 mins                     Julaine Fusi PTA 01/15/22, 12:52 PM

## 2022-01-15 NOTE — Progress Notes (Signed)
[facility] Progress Note  Patient: Todd Brown    FKC:[id number]  DOA: 12/15/2021    Date of Service: the patient was seen and examined on 01/15/2022  Brief hospital course: 61 year old male with past medical history of hypertension and end-stage renal disease on hemodialysis presented to the emergency room from his group home/Assisted living facility on 7/17 with confusion.  (Patient's baseline was that he is unable to care for himself and has a legal guardian.)  Patient last received dialysis on Friday, 7/14.  Patient found to have markedly elevated blood pressure with a systolic in the 749S requiring Cardene drip and placement of patient to the ICU.  Patient with questionable cardiac ischemia as well.  CT scan of head and neurological exam unremarkable.  Patient was admitted to the hospitalist service and started on his oral antihypertensives, nicardipine drip and given IV labetalol and as needed IV hydralazine.  Nephrology consulted and with plans to resume patient's dialysis.  Over the next few weeks, blood pressure better controlled.  At times became agitated and seen by psychiatry and started on scheduled and as needed Haldol, leading to more compliance with better mentation as well as taking his antihypertensives.  Hospital course complicated by episode of periorbital swelling bilaterally felt to be secondary to subcutaneous emphysema, resolved on steroids.  Also had episode of oral thrush treated with Magic mouthwash.  Patient's assisted living facility willing to take patient back after becomes much stronger at skilled nursing facility.  Skilled nursing facility search in progress.  Assessment and Plan: * Acute metabolic encephalopathy-resolved as of 12/31/2021 Felt to be secondary to malignant hypertension.  Resolved and now back at baseline.  Had some increased somnolence which improved after stopping his scheduled Benadryl and Haldol.  Hypertensive emergency-resolved as of  12/31/2021 Resolved.  See below.  ESRD on dialysis [facility]) Nephrology consulted, full dialysis session following admission and again today to keep him on his Monday Wednesday Friday schedule. -Continue scheduled dialysis  Mental health disorder Unspecified.  Patient is unable to care for himself.  It is unclear if he is ever had a formal diagnosis.  He is on Tegretol and Artane which he should continue.  As needed Haldol for acute behavior agitation as above.  He is for the most part much more calm and cooperative these days over times, he intermittently refused vitals being checked or taking his medications.  Essential hypertension Resumed on his home p.o. amlodipine 10 mg daily, clonidine 0.3 mg p.o. 3 times daily, hydralazine 100 mg p.o. 3 times daily, isosorbide mononitrate 60 mg p.o. twice daily, losartan 100 mg daily.  Pressures at time improved, but then blood pressure starts to spike again.  As above, changing ARB and adding alpha-blocker. -Nephrology added clonidine patches with p.o. clonidine. -Minoxidil was then added and titrated up to 10 mg daily.  During the first week of August, patient occasionally had episodes of lower blood pressure without any blood pressure spikes, so some of his newer medications were decreased: hydralazine and minoxidil was scaled back.  With blood pressure on the rise again, have increased minoxidil back to 10 mg we will increase hydralazine up to 10 mg 3 times daily.  Oral thrush-resolved as of 01/02/2022 Improved with Magic mouthwash  Subcutaneous emphysema (HCC)-resolved as of 12/18/2021 On steroids.  Appreciate pulmonary assistance.  Subcutaneous emphysema looks to be resolved.  Changed from Solu-Medrol to p.o. prednisone quick taper.  Physical deconditioning PT OT are recommending SNF.  However, this is similar to a previous  hospitalization where TOC was unable to find skilled nursing and over time, patient's strength improved that he was able to be  discharged back to his assisted living facility.  Hyperkalemia Potassiums have been trending upward for the last 2 days and patient started on Veltassa.  Improving and today at 5.1.  Recheck labs in the morning.  Malnutrition of moderate degree Nutrition Status: Nutrition Problem: Moderate Malnutrition Etiology: chronic illness (ESRD on HD) Signs/Symptoms: mild fat depletion, mild muscle depletion, moderate muscle depletion Interventions: Refer to RD note for recommendations, Nepro shake, MVI, Prostat       Body mass index is 25.92 kg/m.  Nutrition Problem: Moderate Malnutrition Etiology: chronic illness (ESRD on HD)     Consultants: Nephrology Critical care Psychiatry  Procedures: Hemodialysis   Antimicrobials: None  Code Status: Full code   Subjective: Patient okay, less verbal today  Objective: Blood pressures have remained stable Vitals:   01/15/22 0833 01/15/22 1119  BP: (!) 170/79   Pulse:    Resp:    Temp: 98.7 F (37.1 C)   SpO2: 98% 96%   No intake or output data in the 24 hours ending 01/15/22 1513    Filed Weights   01/12/22 0814 01/12/22 1255 01/14/22 1211  Weight: 91.2 kg 88.7 kg 86.7 kg   Body mass index is 25.92 kg/m.  Exam:  General: Not very interactive HEENT: Normocephalic and atraumatic, mucous membranes moist Cardiovascular: Regular rate and rhythm, S1-S2, 2 out of 6 systolic ejection murmur Respiratory: Clear to auscultation bilaterally Abdomen: Soft, nontender, nondistended, hypoactive bowel sounds Musculoskeletal: No clubbing or cyanosis or edema Skin: No skin breaks, tears or lesions Psychiatry: Appropriate, no evidence of psychoses, flattened affect Neurology: Difficult to assess secondary to compliance  Data Reviewed: Potassium today at 5.1. Disposition:  Status is: Inpatient Remains inpatient appropriate because:  -Treating hyperkalemia -Looking for short-term skilled nursing  Anticipated discharge date:  Unknown  Family Communication: No family, message left for guardian DVT Prophylaxis: heparin injection 5,000 Units Start: 12/15/21 1645 Place TED hose Start: 12/15/21 1629    Author: Annita Brod ,MD 01/15/2022 3:13 PM  To reach On-call, see care teams to locate the attending and reach out via [email or web address]. Between 7PM-7AM, please contact night-coverage If you still have difficulty reaching the attending provider, please page the [facility] (Director on Call) for [facility] on amion for assistance.

## 2022-01-15 NOTE — TOC Progression Note (Signed)
Transition of Care [facility]) - Progression Note    Patient Details  Name: Todd Brown MRN: [id number] Date of Birth: Mar 31, 1961  Transition of Care [facility]) CM/SW [place], [place] Phone Number: 01/15/2022, 8:37 AM  Clinical Narrative:     CSW notes SNF placement recommended, spoke with [name] at [facility] reports he can take patient back once patient receives short term rehab at a snf and gets stronger. CSW has re sent referral to [facility] with [facility] who is reviewing referral.    Expected Discharge Plan: Skilled Nursing Facility Barriers to Discharge: Continued Medical Work up  Expected Discharge Plan and Services Expected Discharge Plan: [place] arrangements for the past 2 months: [place] Expected Discharge Date: 12/19/21                                     Social Determinants of Health (SDOH) Interventions    Readmission Risk Interventions     No data to display

## 2022-01-16 DIAGNOSIS — E875 Hyperkalemia: Secondary | ICD-10-CM | POA: Diagnosis not present

## 2022-01-16 DIAGNOSIS — G9341 Metabolic encephalopathy: Secondary | ICD-10-CM | POA: Diagnosis not present

## 2022-01-16 DIAGNOSIS — I10 Essential (primary) hypertension: Secondary | ICD-10-CM | POA: Diagnosis not present

## 2022-01-16 DIAGNOSIS — N186 End stage renal disease: Secondary | ICD-10-CM | POA: Diagnosis not present

## 2022-01-16 LAB — BASIC METABOLIC PANEL WITH GFR
Anion gap: 9 (ref 5–15)
BUN: 74 mg/dL — ABNORMAL HIGH (ref 8–23)
CO2: 29 mmol/L (ref 22–32)
Calcium: 9.3 mg/dL (ref 8.9–10.3)
Chloride: 98 mmol/L (ref 98–111)
Creatinine, Ser: 6.01 mg/dL — ABNORMAL HIGH (ref 0.61–1.24)
GFR, Estimated: 10 mL/min — ABNORMAL LOW
Glucose, Bld: 79 mg/dL (ref 70–99)
Potassium: 5.1 mmol/L (ref 3.5–5.1)
Sodium: 136 mmol/L (ref 135–145)

## 2022-01-16 MED ORDER — EPOETIN ALFA 10000 UNIT/ML IJ SOLN
INTRAMUSCULAR | Status: AC
  Filled 2022-01-16: qty 1

## 2022-01-16 NOTE — Progress Notes (Signed)
[facility]  ROUNDING NOTE   Subjective:   Todd Brown is a 61 year old male with past medical history including hypertension, anemia, schizophrenia and end-stage renal disease on hemodialysis.  Patient has presented to the ED with altered mental status.  Patient has been admitted for Altered mental status [R41.82] Hypertensive urgency [I16.0] Severe hypertension [I10] Altered mental status, unspecified altered mental status type [R41.82]  Patient is known to our practice and receives outpatient dialysis treatments at [facility] on a MWF schedule.  Update: Patient seen and evaluated during dialysis   HEMODIALYSIS FLOWSHEET:  Blood Flow Rate (mL/min): 400 mL/min Arterial Pressure (mmHg): -140 mmHg Venous Pressure (mmHg): 250 mmHg TMP (mmHg): 9 mmHg Ultrafiltration Rate (mL/min): 883 mL/min Dialysate Flow Rate (mL/min): 300 ml/min Dialysis Fluid Bolus: Normal Saline Bolus Amount (mL): 300 mL  No complaints during this treatment   Objective:  Vital signs in last 24 hours:  Temp:  [97.6 F (36.4 C)-98.7 F (37.1 C)] 97.9 F (36.6 C) (08/18 1140) Pulse Rate:  [61-76] 72 (08/18 1140) Resp:  [12-24] 14 (08/18 1140) BP: (146-190)/(68-98) 163/82 (08/18 1140) SpO2:  [88 %-100 %] 100 % (08/18 1140) Weight:  [87.9 kg-90 kg] 87.9 kg (08/18 1151)  Weight change:  Filed Weights   01/16/22 0751 01/16/22 1140 01/16/22 1151  Weight: 90 kg 87.9 kg 87.9 kg    Intake/Output: No intake/output data recorded.   Intake/Output this shift:  Total I/O In: -  Out: 2200 [Other:2200]  Physical Exam: General: NAD  Head: Normocephalic, atraumatic. Moist oral mucosal membranes  Eyes: Anicteric  Lungs:  Clear to auscultation, normal effort, room air  Heart: Regular rate and rhythm  Abdomen:  Soft, nontender, non distended  Extremities:  trace peripheral edema.  Neurologic: Alert, answer simple questions  Skin: No lesions  Access: Lt AVF     Basic Metabolic  Panel: Recent Labs  Lab 01/12/22 0926 01/13/22 0532 01/14/22 0507 01/15/22 0411 01/16/22 0617  NA 133* 135 135 137 136  K 6.2* 5.3* 6.0* 5.1 5.1  CL 95* 96* 98 98 98  CO2 27 30 28 29 29   GLUCOSE 120* 85 71 74 79  BUN 93* 61* 71* 57* 74*  CREATININE 7.03* 5.06* 5.93* 4.86* 6.01*  CALCIUM 9.7 9.4 9.3 8.9 9.3  PHOS 4.2  --   --   --   --      Liver Function Tests: Recent Labs  Lab 01/12/22 0926  ALBUMIN 3.1*    No results for input(s): "LIPASE", "AMYLASE" in the last 168 hours. No results for input(s): "AMMONIA" in the last 168 hours.  CBC: Recent Labs  Lab 01/11/22 0314 01/12/22 0441 01/12/22 0926 01/13/22 0532 01/14/22 0507  WBC 4.9 5.1 4.7 4.4 5.9  HGB 7.4* 8.0* 8.1* 8.2* 8.9*  HCT 22.4* 23.9* 24.1* 24.7* 26.6*  MCV 102.3* 101.7* 100.0 101.2* 102.7*  PLT 221 246 246 223 210     Cardiac Enzymes: No results for input(s): "CKTOTAL", "CKMB", "CKMBINDEX", "TROPONINI" in the last 168 hours.  BNP: Invalid input(s): "POCBNP"  CBG: Recent Labs  Lab 01/14/22 2047  GLUCAP 117*     Microbiology: Results for orders placed or performed during the hospital encounter of 12/15/21  SARS Coronavirus 2 by RT PCR (hospital order, performed in [facility] hospital lab) *cepheid single result test* Anterior Nasal Swab     Status: None   Collection Time: 12/15/21  5:50 PM   Specimen: Anterior Nasal Swab  Result Value Ref Range Status   SARS Coronavirus 2  by RT PCR NEGATIVE NEGATIVE Final    Comment: (NOTE) SARS-CoV-2 target nucleic acids are NOT DETECTED.  The SARS-CoV-2 RNA is generally detectable in upper and lower respiratory specimens during the acute phase of infection. The lowest concentration of SARS-CoV-2 viral copies this assay can detect is 250 copies / mL. A negative result does not preclude SARS-CoV-2 infection and should not be used as the sole basis for treatment or other patient management decisions.  A negative result may occur with improper  specimen collection / handling, submission of specimen other than nasopharyngeal swab, presence of viral mutation(s) within the areas targeted by this assay, and inadequate number of viral copies (<250 copies / mL). A negative result must be combined with clinical observations, patient history, and epidemiological information.  Fact Sheet for Patients:   https://www.patel.info/  Fact Sheet for Healthcare Providers: https://hall.com/  This test is not yet approved or  cleared by the [place] FDA and has been authorized for detection and/or diagnosis of SARS-CoV-2 by FDA under an Emergency Use Authorization (EUA).  This EUA will remain in effect (meaning this test can be used) for the duration of the COVID-19 declaration under Section 564(b)(1) of the Act, 21 U.S.C. section 360bbb-3(b)(1), unless the authorization is terminated or revoked sooner.  Performed at [facility], [address] [place], [place] [address]   MRSA Next Gen by PCR, Nasal     Status: None   Collection Time: 12/15/21  9:03 PM   Specimen: Nasal Mucosa; Nasal Swab  Result Value Ref Range Status   MRSA by PCR Next Gen NOT DETECTED NOT DETECTED Final    Comment: (NOTE) The GeneXpert MRSA Assay (FDA approved for NASAL specimens only), is one component of a comprehensive MRSA colonization surveillance program. It is not intended to diagnose MRSA infection nor to guide or monitor treatment for MRSA infections. Test performance is not FDA approved in patients less than 18 years old. Performed at [facility], [address] [place], [place] [address]     Coagulation Studies: No results for input(s): "LABPROT", "INR" in the last 72 hours.  Urinalysis: No results for input(s): "COLORURINE", "LABSPEC", "PHURINE", "GLUCOSEU", "HGBUR", "BILIRUBINUR", "KETONESUR", "PROTEINUR", "UROBILINOGEN", "NITRITE", "LEUKOCYTESUR" in the last 72 hours.  Invalid  input(s): "APPERANCEUR"    Imaging: No results found.   Medications:    anticoagulant sodium citrate      (feeding supplement) PROSource Plus  30 mL Oral TID BM   amLODipine  10 mg Oral q morning   calcium acetate  667 mg Oral TID WC   carbamazepine  1,000 mg Oral q AM   Chlorhexidine Gluconate Cloth  6 each Topical Daily   Chlorhexidine Gluconate Cloth  6 each Topical Q0600   cloNIDine  0.2 mg Transdermal Weekly   doxazosin  4 mg Oral QHS   epoetin (EPOGEN/PROCRIT) injection  10,000 Units Intravenous Q M,W,F-HD   ferrous sulfate  325 mg Oral Q breakfast   fluPHENAZine  2.5 mg Oral q AM   heparin  5,000 Units Subcutaneous Q8H   hydrALAZINE  10 mg Oral TID   irbesartan  300 mg Oral Daily   isosorbide mononitrate  60 mg Oral BID   minoxidil  10 mg Oral Daily   mometasone-formoterol  2 puff Inhalation BID   multivitamin  1 tablet Oral QHS   mouth rinse  15 mL Mouth Rinse 4 times per day   pantoprazole  40 mg Oral Daily   patiromer  16.8 g Oral Daily  polyethylene glycol  17 g Oral BID   trihexyphenidyl  2 mg Oral BID WC   albuterol, alteplase, anticoagulant sodium citrate, diphenhydrAMINE, haloperidol lactate, heparin, hydrALAZINE, lidocaine (PF), lidocaine-prilocaine, ondansetron **OR** ondansetron (ZOFRAN) IV, mouth rinse, pentafluoroprop-tetrafluoroeth, polyethylene glycol, senna-docusate  Assessment/ Plan:  Mr. Godson Pollan is a 61 y.o.  male with past medical history including hypertension, anemia, schizophrenia and end-stage renal disease on hemodialysis.  Patient has presented to the ED with altered mental status.  Patient has been admitted for Altered mental status [R41.82] Hypertensive urgency [I16.0] Severe hypertension [I10] Altered mental status, unspecified altered mental status type [R41.82]  [facility]/MWF/Lt AVF  End-stage renal disease with hyperkalemia on hemodialysis.  Will maintain outpatient schedule if possible.   Receiving dialysis today,  UF goal 2-2.5L as tolerated. Next treatment scheduled for Monday.  Monitoring discharge plan to include rehabSNF placement     2. Anemia of chronic kidney disease Lab Results  Component Value Date   HGB 8.9 (L) 01/14/2022    Continue EPO with dialysis treatments.    3. Secondary Hyperparathyroidism:  Lab Results  Component Value Date   CALCIUM 9.3 01/16/2022   PHOS 4.2 01/12/2022   Calcium acetate restarted with meals.  4.  Hypertension with chronic kidney disease.  Home regimen includes amlodipine, clonidine, hydralazine, isosorbide, and losartan.    Currently on amlodipine 10 mg daily, clonidine patch 0.2 mg per 24 hours, Cardura 4 mg daily at bedtime, hydralazine 10 mg 3 times per day, Avapro 300 mg daily, isosorbide 60 mg twice a day, minoxidil 5 mg daily.  Blood pressure 163/82.    LOS: [place] 8/18/202311:58 AM

## 2022-01-16 NOTE — Progress Notes (Signed)
Hd tx of 3.5hrs completed. 78.4L total vol processed. No complications. Report given to Siri Cole, rn. Total uf removed: 2230ml Post hd v/s: 97.9 163/82(107) 72 14 100% Post hd wt: 87.9kgs

## 2022-01-16 NOTE — Progress Notes (Signed)
OT Cancellation Note  Patient Details Name: Todd Brown MRN: [id number] DOB: 08/05/1960   Cancelled Treatment:    Reason Eval/Treat Not Completed: Patient at procedure or test/ unavailable. Pt out for dialysis, will re-attempt OT tx at later date/time as pt is available.  Ardeth Perfect., MPH, MS, OTR/L ascom [phone] 01/16/22, 10:47 AM

## 2022-01-16 NOTE — Progress Notes (Signed)
[facility] Progress Note  Patient: Todd Brown    OXB:[id number]  DOA: 12/15/2021    Date of Service: the patient was seen and examined on 01/16/2022  Brief hospital course: 61 year old male with past medical history of hypertension and end-stage renal disease on hemodialysis presented to the emergency room from his group home/Assisted living facility on 7/17 with confusion.  (Patient's baseline was that he is unable to care for himself and has a legal guardian.)  Patient last received dialysis on Friday, 7/14.  Patient found to have markedly elevated blood pressure with a systolic in the 683M requiring Cardene drip and placement of patient to the ICU.  Patient with questionable cardiac ischemia as well.  CT scan of head and neurological exam unremarkable.  Patient was admitted to the hospitalist service and started on his oral antihypertensives, nicardipine drip and given IV labetalol and as needed IV hydralazine.  Nephrology consulted and with plans to resume patient's dialysis.  Over the next few weeks, blood pressure better controlled.  At times became agitated and seen by psychiatry and started on scheduled and as needed Haldol, leading to more compliance with better mentation as well as taking his antihypertensives.  Hospital course complicated by episode of periorbital swelling bilaterally felt to be secondary to subcutaneous emphysema, resolved on steroids.  Also had episode of oral thrush treated with Magic mouthwash.  Patient's assisted living facility willing to take patient back after becomes much stronger at skilled nursing facility.  Skilled nursing facility search in progress.  Assessment and Plan: * Acute metabolic encephalopathy-resolved as of 12/31/2021 Felt to be secondary to malignant hypertension.  Resolved and now back at baseline.  Had some increased somnolence which improved after stopping his scheduled Benadryl and Haldol.  Hypertensive emergency-resolved as of  12/31/2021 Resolved.  See below.  ESRD on dialysis [facility]) Nephrology consulted, full dialysis session following admission and again today to keep him on his Monday Wednesday Friday schedule. -Continue scheduled dialysis  Mental health disorder Unspecified.  Patient is unable to care for himself.  It is unclear if he is ever had a formal diagnosis.  He is on Tegretol and Artane which he should continue.  As needed Haldol for acute behavior agitation as above.  He is for the most part much more calm and cooperative these days over times, he intermittently refused vitals being checked or taking his medications.  Essential hypertension Resumed on his home p.o. amlodipine 10 mg daily, clonidine 0.3 mg p.o. 3 times daily, hydralazine 100 mg p.o. 3 times daily, isosorbide mononitrate 60 mg p.o. twice daily, losartan 100 mg daily.  Pressures at time improved, but then blood pressure starts to spike again.  As above, changing ARB and adding alpha-blocker. -Nephrology added clonidine patches with p.o. clonidine. -Minoxidil was then added and titrated up to 10 mg daily.  During the first week of August, patient occasionally had episodes of lower blood pressure without any blood pressure spikes, so some of his newer medications were decreased: hydralazine and minoxidil was scaled back.  With blood pressure on the rise again, have increased minoxidil back to 10 mg we will increase hydralazine up to 10 mg 3 times daily.  Pressures still high although this is right before dialysis, so before adjusting medication again, we will see how he does this weekend.  Oral thrush-resolved as of 01/02/2022 Improved with Magic mouthwash  Subcutaneous emphysema (HCC)-resolved as of 12/18/2021 On steroids.  Appreciate pulmonary assistance.  Subcutaneous emphysema looks to be resolved.  Changed  from Solu-Medrol to p.o. prednisone quick taper.  Physical deconditioning PT OT are recommending SNF.  However, this is similar to a  previous hospitalization where TOC was unable to find skilled nursing and over time, patient's strength improved that he was able to be discharged back to his assisted living facility.  Hyperkalemia Potassiums have been trending upward for the last 2 days and patient started on Veltassa.  Improving and today at 5.1.  Recheck labs in the morning.  Malnutrition of moderate degree Nutrition Status: Nutrition Problem: Moderate Malnutrition Etiology: chronic illness (ESRD on HD) Signs/Symptoms: mild fat depletion, mild muscle depletion, moderate muscle depletion Interventions: Refer to RD note for recommendations, Nepro shake, MVI, Prostat       Body mass index is 26.28 kg/m.  Nutrition Problem: Moderate Malnutrition Etiology: chronic illness (ESRD on HD)     Consultants: Nephrology Critical care Psychiatry  Procedures: Hemodialysis   Antimicrobials: None  Code Status: Full code   Subjective: Patient with no complaints  Objective: Blood pressures have remained stable Vitals:   01/16/22 1140 01/16/22 1243  BP: (!) 163/82 (!) 182/77  Pulse: 72 71  Resp: 14 17  Temp: 97.9 F (36.6 C) 98.2 F (36.8 C)  SpO2: 100% 99%    Intake/Output Summary (Last 24 hours) at 01/16/2022 1253 Last data filed at 01/16/2022 1140 Gross per 24 hour  Intake --  Output 2200 ml  Net -2200 ml      Filed Weights   01/16/22 0751 01/16/22 1140 01/16/22 1151  Weight: 90 kg 87.9 kg 87.9 kg   Body mass index is 26.28 kg/m.  Exam:  General: Minimal interaction, no acute distress HEENT: Normocephalic and atraumatic, mucous membranes moist Cardiovascular: Regular rate and rhythm, S1-S2, 2 out of 6 systolic ejection murmur Respiratory: Clear to auscultation bilaterally Abdomen: Soft, nontender, nondistended, hypoactive bowel sounds Musculoskeletal: No clubbing or cyanosis or edema Skin: No skin breaks, tears or lesions Psychiatry: Appropriate, no evidence of psychoses, flattened  affect Neurology: Difficult to assess secondary to compliance  Data Reviewed: Potassium today again at 5.1. Disposition:  Status is: Inpatient Remains inpatient appropriate because:  -Looking for short-term skilled nursing  Anticipated discharge date: Unknown  Family Communication: No family, message left for guardian DVT Prophylaxis: heparin injection 5,000 Units Start: 12/15/21 1645 Place TED hose Start: 12/15/21 1629    Author: Annita Brod ,MD 01/16/2022 12:53 PM  To reach On-call, see care teams to locate the attending and reach out via [email or web address]. Between 7PM-7AM, please contact night-coverage If you still have difficulty reaching the attending provider, please page the [facility] (Director on Call) for [facility] on amion for assistance.

## 2022-01-17 DIAGNOSIS — E875 Hyperkalemia: Secondary | ICD-10-CM | POA: Diagnosis not present

## 2022-01-17 DIAGNOSIS — G9341 Metabolic encephalopathy: Secondary | ICD-10-CM | POA: Diagnosis not present

## 2022-01-17 DIAGNOSIS — N186 End stage renal disease: Secondary | ICD-10-CM | POA: Diagnosis not present

## 2022-01-17 DIAGNOSIS — I10 Essential (primary) hypertension: Secondary | ICD-10-CM | POA: Diagnosis not present

## 2022-01-17 NOTE — Progress Notes (Signed)
Occupational Therapy Treatment Patient Details Name: Todd Brown MRN: [id number] DOB: 1960-11-08 Today's Date: 01/17/2022   History of present illness 61 year old male with past medical history of hypertension and end-stage renal disease on hemodialysis presented to the emergency room from his group home/skilled nursing facility on 7/17 with confusion.  (Patient's baseline is that he is unable to care for himself and has a legal guardian.)  Patient last received dialysis on Friday, 7/14.  From being in the emergency room on 7/17, unable to get dialysis yesterday.  Patient found to have markedly elevated blood pressure with a systolic in the 062B.  Patient with questionable cardiac ischemia as well   OT comments  Chart reviewed, RN cleared pt for participation in OT tx session. Tx session targeted improving activity tolerance in the setting of participation in functional mobility in order to facilitate return to PLOF. Increased verbal and tactile cues required on this date for participation in mobility. Pt performed STS 3 attempts with MIN A however step by step verbal and tactile cues required. Step pivot transfer to bedside chair performed with MIN A with RW with step by setp verbal and tactile cues required. MAX A required for toileting following BM, pt unable to assist with wiping on this date due to increased weakness. Pt appears to present with increased weakness as compared to previous tx sessions. OT will continue to follow while admitted. Encouraged pt and staff for out of bed to chair as often as possible. OT will follow acutely.    Recommendations for follow up therapy are one component of a multi-disciplinary discharge planning process, led by the attending physician.  Recommendations may be updated based on patient status, additional functional criteria and insurance authorization.    Follow Up Recommendations  Skilled nursing-short term rehab (<3 hours/day)    Assistance Recommended at  Discharge Frequent or constant Supervision/Assistance  Patient can return home with the following  A lot of help with bathing/dressing/bathroom;A lot of help with walking and/or transfers;Assistance with feeding;Assist for transportation;Direct supervision/assist for medications management;Direct supervision/assist for financial management;Assistance with cooking/housework;Help with stairs or ramp for entrance   Equipment Recommendations  Other (comment) (per next venue of care)    Recommendations for Other Services      Precautions / Restrictions Precautions Precautions: Fall Precaution Comments: vision impaired Restrictions Weight Bearing Restrictions: No       Mobility Bed Mobility Overal bed mobility: Needs Assistance Bed Mobility: Sit to Supine     Supine to sit: Mod assist, HOB elevated          Transfers Overall transfer level: Needs assistance Equipment used: Rolling walker (2 wheels) Transfers: Sit to/from Stand Sit to Stand: Mod assist, From elevated surface           General transfer comment: increased time and attempts for STS efforts on this date. 3 STS completed with MOD A with bed raised however pt requires significant verbal and tactile cues for participation     Balance Overall balance assessment: Needs assistance Sitting-balance support: Feet supported Sitting balance-[name]: Fair     Standing balance support: Bilateral upper extremity supported, During functional activity Standing balance-[name]: Poor                             ADL either performed or assessed with clinical judgement   ADL Overall ADL's : Needs assistance/impaired  Toilet Transfer: Minimal Print production planner Details (indicate cue type and reason): step pivot transfer to bedside chair with step by step verbal cues due to vision impairment Toileting- Clothing Manipulation and Hygiene: Sit to/from stand;Maximal  assistance Toileting - Clothing Manipulation Details (indicate cue type and reason): for thoroughness, pt unable to attempt to self clean due to weakness on this date            Extremity/Trunk Assessment              Vision       Perception     Praxis      Cognition Arousal/Alertness: Awake/alert Behavior During Therapy: Flat affect Overall Cognitive Status: No family/caregiver present to determine baseline cognitive functioning Area of Impairment: Orientation, Attention, Memory, Following commands, Safety/judgement, Awareness, Problem solving                 Orientation Level: Disoriented to, Situation   Memory: Decreased short-term memory Following Commands: Follows one step commands inconsistently Safety/Judgement: Decreased awareness of safety, Decreased awareness of deficits Awareness: Intellectual Problem Solving: Slow processing, Decreased initiation, Difficulty sequencing, Requires verbal cues, Requires tactile cues          Exercises      Shoulder Instructions       General Comments spo2 >90% on 2L via [place]; increased edema noted throughout BUE, pt positioned approrpiately' RN alerted    Pertinent Vitals/ Pain       Pain Assessment Pain Assessment: No/denies pain  Home Living                                          Prior Functioning/Environment              Frequency  Min 2X/week        Progress Toward Goals  OT Goals(current goals can now be found in the care plan section)  Progress towards OT goals: Progressing toward goals;OT to reassess next treatment     Plan Discharge plan remains appropriate;Frequency remains appropriate    Co-evaluation                 AM-PAC OT "6 Clicks" Daily Activity     Outcome Measure   Help from another person eating meals?: A Little Help from another person taking care of personal grooming?: A Little Help from another person toileting, which includes using  toliet, bedpan, or urinal?: A Little Help from another person bathing (including washing, rinsing, drying)?: A Lot Help from another person to put on and taking off regular upper body clothing?: A Little Help from another person to put on and taking off regular lower body clothing?: A Lot 6 Click Score: 16    End of Session Equipment Utilized During Treatment: Oxygen;Gait belt;Rolling walker (2 wheels)  OT Visit Diagnosis: Unsteadiness on feet (R26.81);Muscle weakness (generalized) (M62.81)   Activity Tolerance Patient tolerated treatment well   Patient Left in chair;with call bell/phone within reach;with chair alarm set   Nurse Communication Mobility status        Time: 6222-9798 OT Time Calculation (min): 20 min  Charges: OT General Charges $OT Visit: 1 Visit OT Treatments $Therapeutic Activity: 8-22 mins  Shanon Payor, OTD OTR/L  01/17/22, 3:07 PM

## 2022-01-17 NOTE — Progress Notes (Signed)
[facility]  ROUNDING NOTE   Subjective:   Todd Brown is a 61 year old male with past medical history including hypertension, anemia, schizophrenia and end-stage renal disease on hemodialysis.  Patient has presented to the ED with altered mental status.  Patient has been admitted for Altered mental status [R41.82] Hypertensive urgency [I16.0] Severe hypertension [I10] Altered mental status, unspecified altered mental status type [R41.82]  Patient is known to our practice and receives outpatient dialysis treatments at [facility] on a MWF schedule.  Update: Patient seen resting quietly in bed, alert Completed breakfast tray at bedside Trace lower extremity edema Remains on room air   Objective:  Vital signs in last 24 hours:  Temp:  [98.1 F (36.7 C)-98.3 F (36.8 C)] 98.1 F (36.7 C) (08/19 0942) Pulse Rate:  [71-79] 79 (08/19 0942) Resp:  [16-19] 19 (08/19 0942) BP: (119-182)/(67-84) 147/75 (08/19 0942) SpO2:  [89 %-99 %] 93 % (08/19 0942)  Weight change:  Filed Weights   01/16/22 0751 01/16/22 1140 01/16/22 1151  Weight: 90 kg 87.9 kg 87.9 kg    Intake/Output: I/O last 3 completed shifts: In: 600 [P.O.:600] Out: 2200 [Other:2200]   Intake/Output this shift:  No intake/output data recorded.  Physical Exam: General: NAD  Head: Normocephalic, atraumatic. Moist oral mucosal membranes  Eyes: Anicteric  Lungs:  Clear to auscultation, normal effort, room air  Heart: Regular rate and rhythm  Abdomen:  Soft, nontender, non distended  Extremities:  trace peripheral edema.  Neurologic: Alert, answer simple questions  Skin: No lesions  Access: Lt AVF     Basic Metabolic Panel: Recent Labs  Lab 01/12/22 0926 01/13/22 0532 01/14/22 0507 01/15/22 0411 01/16/22 0617  NA 133* 135 135 137 136  K 6.2* 5.3* 6.0* 5.1 5.1  CL 95* 96* 98 98 98  CO2 27 30 28 29 29   GLUCOSE 120* 85 71 74 79  BUN 93* 61* 71* 57* 74*  CREATININE 7.03* 5.06* 5.93*  4.86* 6.01*  CALCIUM 9.7 9.4 9.3 8.9 9.3  PHOS 4.2  --   --   --   --      Liver Function Tests: Recent Labs  Lab 01/12/22 0926  ALBUMIN 3.1*    No results for input(s): "LIPASE", "AMYLASE" in the last 168 hours. No results for input(s): "AMMONIA" in the last 168 hours.  CBC: Recent Labs  Lab 01/11/22 0314 01/12/22 0441 01/12/22 0926 01/13/22 0532 01/14/22 0507  WBC 4.9 5.1 4.7 4.4 5.9  HGB 7.4* 8.0* 8.1* 8.2* 8.9*  HCT 22.4* 23.9* 24.1* 24.7* 26.6*  MCV 102.3* 101.7* 100.0 101.2* 102.7*  PLT 221 246 246 223 210     Cardiac Enzymes: No results for input(s): "CKTOTAL", "CKMB", "CKMBINDEX", "TROPONINI" in the last 168 hours.  BNP: Invalid input(s): "POCBNP"  CBG: Recent Labs  Lab 01/14/22 2047  GLUCAP 117*     Microbiology: Results for orders placed or performed during the hospital encounter of 12/15/21  SARS Coronavirus 2 by RT PCR (hospital order, performed in [facility] hospital lab) *cepheid single result test* Anterior Nasal Swab     Status: None   Collection Time: 12/15/21  5:50 PM   Specimen: Anterior Nasal Swab  Result Value Ref Range Status   SARS Coronavirus 2 by RT PCR NEGATIVE NEGATIVE Final    Comment: (NOTE) SARS-CoV-2 target nucleic acids are NOT DETECTED.  The SARS-CoV-2 RNA is generally detectable in upper and lower respiratory specimens during the acute phase of infection. The lowest concentration of SARS-CoV-2 viral copies  this assay can detect is 250 copies / mL. A negative result does not preclude SARS-CoV-2 infection and should not be used as the sole basis for treatment or other patient management decisions.  A negative result may occur with improper specimen collection / handling, submission of specimen other than nasopharyngeal swab, presence of viral mutation(s) within the areas targeted by this assay, and inadequate number of viral copies (<250 copies / mL). A negative result must be combined with clinical observations,  patient history, and epidemiological information.  Fact Sheet for Patients:   https://www.patel.info/  Fact Sheet for Healthcare Providers: https://hall.com/  This test is not yet approved or  cleared by the [place] FDA and has been authorized for detection and/or diagnosis of SARS-CoV-2 by FDA under an Emergency Use Authorization (EUA).  This EUA will remain in effect (meaning this test can be used) for the duration of the COVID-19 declaration under Section 564(b)(1) of the Act, 21 U.S.C. section 360bbb-3(b)(1), unless the authorization is terminated or revoked sooner.  Performed at [facility], [address] [place], [place] [address]   MRSA Next Gen by PCR, Nasal     Status: None   Collection Time: 12/15/21  9:03 PM   Specimen: Nasal Mucosa; Nasal Swab  Result Value Ref Range Status   MRSA by PCR Next Gen NOT DETECTED NOT DETECTED Final    Comment: (NOTE) The GeneXpert MRSA Assay (FDA approved for NASAL specimens only), is one component of a comprehensive MRSA colonization surveillance program. It is not intended to diagnose MRSA infection nor to guide or monitor treatment for MRSA infections. Test performance is not FDA approved in patients less than 52 years old. Performed at [facility], [address] [place], [place] [address]     Coagulation Studies: No results for input(s): "LABPROT", "INR" in the last 72 hours.  Urinalysis: No results for input(s): "COLORURINE", "LABSPEC", "PHURINE", "GLUCOSEU", "HGBUR", "BILIRUBINUR", "KETONESUR", "PROTEINUR", "UROBILINOGEN", "NITRITE", "LEUKOCYTESUR" in the last 72 hours.  Invalid input(s): "APPERANCEUR"    Imaging: No results found.   Medications:    anticoagulant sodium citrate      (feeding supplement) PROSource Plus  30 mL Oral TID BM   amLODipine  10 mg Oral q morning   calcium acetate  667 mg Oral TID WC   carbamazepine  1,000 mg Oral q  AM   Chlorhexidine Gluconate Cloth  6 each Topical Daily   Chlorhexidine Gluconate Cloth  6 each Topical Q0600   cloNIDine  0.2 mg Transdermal Weekly   doxazosin  4 mg Oral QHS   epoetin (EPOGEN/PROCRIT) injection  10,000 Units Intravenous Q M,W,F-HD   ferrous sulfate  325 mg Oral Q breakfast   fluPHENAZine  2.5 mg Oral q AM   heparin  5,000 Units Subcutaneous Q8H   hydrALAZINE  10 mg Oral TID   irbesartan  300 mg Oral Daily   isosorbide mononitrate  60 mg Oral BID   minoxidil  10 mg Oral Daily   mometasone-formoterol  2 puff Inhalation BID   multivitamin  1 tablet Oral QHS   mouth rinse  15 mL Mouth Rinse 4 times per day   pantoprazole  40 mg Oral Daily   patiromer  16.8 g Oral Daily   polyethylene glycol  17 g Oral BID   trihexyphenidyl  2 mg Oral BID WC   albuterol, alteplase, anticoagulant sodium citrate, diphenhydrAMINE, haloperidol lactate, heparin, hydrALAZINE, lidocaine (PF), lidocaine-prilocaine, ondansetron **OR** ondansetron (ZOFRAN) IV, mouth rinse, pentafluoroprop-tetrafluoroeth, polyethylene glycol, senna-docusate  Assessment/ Plan:  Todd Brown is a 61 y.o.  male with past medical history including hypertension, anemia, schizophrenia and end-stage renal disease on hemodialysis.  Patient has presented to the ED with altered mental status.  Patient has been admitted for Altered mental status [R41.82] Hypertensive urgency [I16.0] Severe hypertension [I10] Altered mental status, unspecified altered mental status type [R41.82]  [facility]/MWF/Lt AVF  End-stage renal disease with hyperkalemia on hemodialysis.  Will maintain outpatient schedule if possible.   Received dialysis yesterday, UF goal 2.2 L achieved.  Next treatment scheduled for Monday.  Currently monitoring discharge plan to assess outpatient needs.     2. Anemia of chronic kidney disease Lab Results  Component Value Date   HGB 8.9 (L) 01/14/2022    Hemoglobin just below target.  Continue  EPO with dialysis treatments.    3. Secondary Hyperparathyroidism:  Lab Results  Component Value Date   CALCIUM 9.3 01/16/2022   PHOS 4.2 01/12/2022  Calcium and phosphorus within desired range. Calcium acetate restarted with meals.  4.  Hypertension with chronic kidney disease.  Home regimen includes amlodipine, clonidine, hydralazine, isosorbide, and losartan.    Currently on amlodipine 10 mg daily, clonidine patch 0.2 mg per 24 hours, Cardura 4 mg daily at bedtime, hydralazine 10 mg 3 times per day, Avapro 300 mg daily, isosorbide 60 mg twice a day, minoxidil 5 mg daily.  Blood pressure well controlled.    LOS: [place] 8/19/202312:35 PM

## 2022-01-17 NOTE — Progress Notes (Signed)
[facility] Progress Note  Patient: Todd Brown    PIR:[id number]  DOA: 12/15/2021    Date of Service: the patient was seen and examined on 01/17/2022  Brief hospital course: 61 year old male with past medical history of hypertension and end-stage renal disease on hemodialysis presented to the emergency room from his group home/Assisted living facility on 7/17 with confusion.  (Patient's baseline was that he is unable to care for himself and has a legal guardian.)  Patient last received dialysis on Friday, 7/14.  Patient found to have markedly elevated blood pressure with a systolic in the 630Z requiring Cardene drip and placement of patient to the ICU.  Patient with questionable cardiac ischemia as well.  CT scan of head and neurological exam unremarkable.  Patient was admitted to the hospitalist service and started on his oral antihypertensives, nicardipine drip and given IV labetalol and as needed IV hydralazine.  Nephrology consulted and with plans to resume patient's dialysis.  Over the next few weeks, blood pressure better controlled.  At times became agitated and seen by psychiatry and started on scheduled and as needed Haldol, leading to more compliance with better mentation as well as taking his antihypertensives.  Hospital course complicated by episode of periorbital swelling bilaterally felt to be secondary to subcutaneous emphysema, resolved on steroids.  Also had episode of oral thrush treated with Magic mouthwash.  Patient's assisted living facility willing to take patient back after becomes much stronger at skilled nursing facility.  Skilled nursing facility search in progress.  Assessment and Plan: * Acute metabolic encephalopathy-resolved as of 12/31/2021 Felt to be secondary to malignant hypertension.  Resolved and now back at baseline.  Had some increased somnolence which improved after stopping his scheduled Benadryl and Haldol.  Hypertensive emergency-resolved as of  12/31/2021 Resolved.  See below.  ESRD on dialysis [facility]) Nephrology consulted, full dialysis session following admission and again today to keep him on his Monday Wednesday Friday schedule. -Continue scheduled dialysis  Mental health disorder Unspecified.  Patient is unable to care for himself.  It is unclear if he is ever had a formal diagnosis.  He is on Tegretol and Artane which he should continue.  As needed Haldol for acute behavior agitation as above.  He is for the most part much more calm and cooperative these days over times, he intermittently refused vitals being checked or taking his medications.  Essential hypertension Resumed on his home p.o. amlodipine 10 mg daily, clonidine 0.3 mg p.o. 3 times daily, hydralazine 100 mg p.o. 3 times daily, isosorbide mononitrate 60 mg p.o. twice daily, losartan 100 mg daily.  Pressures at time improved, but then blood pressure starts to spike again.  As above, changing ARB and adding alpha-blocker. -Nephrology added clonidine patches with p.o. clonidine. -Minoxidil was then added and titrated up to 10 mg daily.  During the first week of August, patient occasionally had episodes of lower blood pressure without any blood pressure spikes, so some of his newer medications were decreased: hydralazine and minoxidil was scaled back.  With blood pressure on the rise again, have increased minoxidil back to 10 mg and hydralazine up to 10 mg 3 times daily.  Pressures better after dialysis.  Oral thrush-resolved as of 01/02/2022 Improved with Magic mouthwash  Subcutaneous emphysema (HCC)-resolved as of 12/18/2021 On steroids.  Appreciate pulmonary assistance.  Subcutaneous emphysema looks to be resolved.  Changed from Solu-Medrol to p.o. prednisone quick taper.  Physical deconditioning PT OT are recommending SNF.  However, this is similar  to a previous hospitalization where TOC was unable to find skilled nursing and over time, patient's strength improved that he  was able to be discharged back to his assisted living facility.  Hyperkalemia Potassiums have been trending upward for the last 2 days and patient started on Veltassa.  Improving and today at 5.1.  Recheck labs in the morning.  Malnutrition of moderate degree Nutrition Status: Nutrition Problem: Moderate Malnutrition Etiology: chronic illness (ESRD on HD) Signs/Symptoms: mild fat depletion, mild muscle depletion, moderate muscle depletion Interventions: Refer to RD note for recommendations, Nepro shake, MVI, Prostat       Body mass index is 26.28 kg/m.  Nutrition Problem: Moderate Malnutrition Etiology: chronic illness (ESRD on HD)     Consultants: Nephrology Critical care Psychiatry  Procedures: Hemodialysis   Antimicrobials: None  Code Status: Full code   Subjective: Patient with no complaints  Objective: Blood pressures have remained stable Vitals:   01/17/22 0449 01/17/22 0942  BP: (!) 140/81 (!) 147/75  Pulse: 72 79  Resp: 18 19  Temp: 98.2 F (36.8 C) 98.1 F (36.7 C)  SpO2: (!) 89% 93%    Intake/Output Summary (Last 24 hours) at 01/17/2022 1326 Last data filed at 01/16/2022 2030 Gross per 24 hour  Intake 360 ml  Output --  Net 360 ml       Filed Weights   01/16/22 0751 01/16/22 1140 01/16/22 1151  Weight: 90 kg 87.9 kg 87.9 kg   Body mass index is 26.28 kg/m.  Exam:  General: Minimal interaction, no acute distress HEENT: Normocephalic and atraumatic, mucous membranes moist Cardiovascular: Regular rate and rhythm, S1-S2, 2 out of 6 systolic ejection murmur Respiratory: Clear to auscultation bilaterally Abdomen: Soft, nontender, nondistended, hypoactive bowel sounds Musculoskeletal: No clubbing or cyanosis or edema Skin: No skin breaks, tears or lesions Psychiatry: Appropriate, no evidence of psychoses, flattened affect Neurology: Difficult to assess secondary to compliance  Data Reviewed: No labs today  Disposition:  Status is:  Inpatient Remains inpatient appropriate because:  -Looking for short-term skilled nursing  Anticipated discharge date: Unknown  Family Communication: No family, message left for guardian DVT Prophylaxis: heparin injection 5,000 Units Start: 12/15/21 1645 Place TED hose Start: 12/15/21 1629    Author: Annita Brod ,MD 01/17/2022 1:26 PM  To reach On-call, see care teams to locate the attending and reach out via [email or web address]. Between 7PM-7AM, please contact night-coverage If you still have difficulty reaching the attending provider, please page the [facility] (Director on Call) for [facility] on amion for assistance.

## 2022-01-18 LAB — BASIC METABOLIC PANEL
Anion gap: 11 (ref 5–15)
BUN: 77 mg/dL — ABNORMAL HIGH (ref 8–23)
CO2: 30 mmol/L (ref 22–32)
Calcium: 9.5 mg/dL (ref 8.9–10.3)
Chloride: 95 mmol/L — ABNORMAL LOW (ref 98–111)
Creatinine, Ser: 6.06 mg/dL — ABNORMAL HIGH (ref 0.61–1.24)
GFR, Estimated: 10 mL/min — ABNORMAL LOW (ref >60)
Glucose, Bld: 92 mg/dL (ref 70–99)
Potassium: 5 mmol/L (ref 3.5–5.1)
Sodium: 136 mmol/L (ref 135–145)

## 2022-01-18 NOTE — Progress Notes (Signed)
[facility] Progress Note  Patient: Todd Brown    IOE:[id number]  DOA: 12/15/2021    Date of Service: the patient was seen and examined on 01/18/2022  Brief hospital course: 61 year old male with past medical history of hypertension and end-stage renal disease on hemodialysis presented to the emergency room from his group home/Assisted living facility on 7/17 with confusion.  (Patient's baseline was that he is unable to care for himself and has a legal guardian.)  Patient last received dialysis on Friday, 7/14.  Patient found to have markedly elevated blood pressure with a systolic in the 182X requiring Cardene drip and placement of patient to the ICU.  Patient with questionable cardiac ischemia as well.  CT scan of head and neurological exam unremarkable.  Patient was admitted to the hospitalist service and started on his oral antihypertensives, nicardipine drip and given IV labetalol and as needed IV hydralazine.  Nephrology consulted and with plans to resume patient's dialysis.  Over the next few weeks, blood pressure better controlled.  At times became agitated and seen by psychiatry and started on scheduled and as needed Haldol, leading to more compliance with better mentation as well as taking his antihypertensives.  Hospital course complicated by episode of periorbital swelling bilaterally felt to be secondary to subcutaneous emphysema, resolved on steroids.  Also had episode of oral thrush treated with Magic mouthwash.  Patient's assisted living facility willing to take patient back after becomes much stronger at skilled nursing facility.  Skilled nursing facility search in progress.  Assessment and Plan: * Acute metabolic encephalopathy-resolved as of 12/31/2021 Felt to be secondary to malignant hypertension.  Resolved and now back at baseline.  Had some increased somnolence which improved after stopping his scheduled Benadryl and Haldol.  Hypertensive emergency-resolved as of  12/31/2021 Resolved.  See below.  ESRD on dialysis [facility]) Nephrology consulted, full dialysis session following admission and again today to keep him on his Monday Wednesday Friday schedule. -Continue scheduled dialysis  Mental health disorder Unspecified.  Patient is unable to care for himself.  It is unclear if he is ever had a formal diagnosis.  He is on Tegretol and Artane which he should continue.  As needed Haldol for acute behavior agitation as above.  He is for the most part much more calm and cooperative these days over times, he intermittently refused vitals being checked or taking his medications.  Essential hypertension Resumed on his home p.o. amlodipine 10 mg daily, clonidine 0.3 mg p.o. 3 times daily, hydralazine 100 mg p.o. 3 times daily, isosorbide mononitrate 60 mg p.o. twice daily, losartan 100 mg daily.  Pressures at time improved, but then blood pressure starts to spike again.  As above, changing ARB and adding alpha-blocker. -Nephrology added clonidine patches with p.o. clonidine. -Minoxidil was then added and titrated up to 10 mg daily.  During the first week of August, patient occasionally had episodes of lower blood pressure without any blood pressure spikes, so some of his newer medications were decreased: hydralazine and minoxidil was scaled back.  With blood pressure on the rise again, have increased minoxidil back to 10 mg and hydralazine up to 10 mg 3 times daily.  Pressures better after dialysis.  Oral thrush-resolved as of 01/02/2022 Improved with Magic mouthwash  Subcutaneous emphysema (HCC)-resolved as of 12/18/2021 On steroids.  Appreciate pulmonary assistance.  Subcutaneous emphysema looks to be resolved.  Changed from Solu-Medrol to p.o. prednisone quick taper.  Physical deconditioning PT OT are recommending SNF.  However, this is similar  to a previous hospitalization where TOC was unable to find skilled nursing and over time, patient's strength improved that he  was able to be discharged back to his assisted living facility.  Hyperkalemia Potassiums have been trending upward for the last 2 days and patient started on Veltassa.  Improving and today at 5.1.  Recheck labs in the morning.  Malnutrition of moderate degree Nutrition Status: Nutrition Problem: Moderate Malnutrition Etiology: chronic illness (ESRD on HD) Signs/Symptoms: mild fat depletion, mild muscle depletion, moderate muscle depletion Interventions: Refer to RD note for recommendations, Nepro shake, MVI, Prostat       Body mass index is 26.28 kg/m.  Nutrition Problem: Moderate Malnutrition Etiology: chronic illness (ESRD on HD)     Consultants: Nephrology Critical care Psychiatry  Procedures: Hemodialysis   Antimicrobials: None  Code Status: Full code   Subjective: Patient about the same, no complaints  Objective: Blood pressures have remained stable over the last 2 days Vitals:   01/18/22 0407 01/18/22 0824  BP: 136/71 (!) 141/72  Pulse: 62 60  Resp: 18 17  Temp: 98.1 F (36.7 C) 98.3 F (36.8 C)  SpO2: 97% 98%   No intake or output data in the 24 hours ending 01/18/22 1348     Filed Weights   01/16/22 0751 01/16/22 1140 01/16/22 1151  Weight: 90 kg 87.9 kg 87.9 kg   Body mass index is 26.28 kg/m.  Exam:  General: Oriented x2, no acute distress HEENT: Normocephalic and atraumatic, mucous membranes moist Cardiovascular: Regular rate and rhythm, S1-S2, 2 out of 6 systolic ejection murmur Respiratory: Clear to auscultation bilaterally Abdomen: Soft, nontender, nondistended, hypoactive bowel sounds Musculoskeletal: No clubbing or cyanosis or edema Skin: No skin breaks, tears or lesions Psychiatry: Appropriate, no evidence of psychoses, flattened affect Neurology: Difficult to assess secondary to compliance  Data Reviewed: Potassium at 5.0  Disposition:  Status is: Inpatient Remains inpatient appropriate because:  -Looking for  short-term skilled nursing  Anticipated discharge date: Unknown  Family Communication: No family, message left for guardian DVT Prophylaxis: heparin injection 5,000 Units Start: 12/15/21 1645 Place TED hose Start: 12/15/21 1629    Author: Annita Brod ,MD 01/18/2022 1:48 PM  To reach On-call, see care teams to locate the attending and reach out via [email or web address]. Between 7PM-7AM, please contact night-coverage If you still have difficulty reaching the attending provider, please page the [facility] (Director on Call) for [facility] on amion for assistance.

## 2022-01-18 NOTE — Progress Notes (Signed)
[facility]  ROUNDING NOTE   Subjective:   Todd Brown is a 61 year old male with past medical history including hypertension, anemia, schizophrenia and end-stage renal disease on hemodialysis.  Patient has presented to the ED with altered mental status.  Patient has been admitted for Altered mental status [R41.82] Hypertensive urgency [I16.0] Severe hypertension [I10] Altered mental status, unspecified altered mental status type [R41.82]  Patient is known to our practice and receives outpatient dialysis treatments at [facility] on a MWF schedule.  Update: Patient seen resting quietly in bed, alert and oriented Tolerating meals Denies pain or discomfort Remains on room air  Objective:  Vital signs in last 24 hours:  Temp:  [98.1 F (36.7 C)-98.7 F (37.1 C)] 98.3 F (36.8 C) (08/20 0824) Pulse Rate:  [60-73] 60 (08/20 0824) Resp:  [17-20] 17 (08/20 0824) BP: (136-143)/(71-80) 141/72 (08/20 0824) SpO2:  [95 %-98 %] 98 % (08/20 0824)  Weight change:  Filed Weights   01/16/22 0751 01/16/22 1140 01/16/22 1151  Weight: 90 kg 87.9 kg 87.9 kg    Intake/Output: I/O last 3 completed shifts: In: 240 [P.O.:240] Out: -    Intake/Output this shift:  No intake/output data recorded.  Physical Exam: General: NAD  Head: Normocephalic, atraumatic. Moist oral mucosal membranes  Eyes: Anicteric  Lungs:  Clear to auscultation, normal effort, room air  Heart: Regular rate and rhythm  Abdomen:  Soft, nontender, non distended  Extremities:  trace peripheral edema.  Neurologic: Alert, answer simple questions  Skin: No lesions  Access: Lt AVF     Basic Metabolic Panel: Recent Labs  Lab 01/12/22 0926 01/13/22 0532 01/14/22 0507 01/15/22 0411 01/16/22 0617 01/18/22 0922  NA 133* 135 135 137 136 136  K 6.2* 5.3* 6.0* 5.1 5.1 5.0  CL 95* 96* 98 98 98 95*  CO2 27 30 28 29 29 30   GLUCOSE 120* 85 71 74 79 92  BUN 93* 61* 71* 57* 74* 77*  CREATININE 7.03*  5.06* 5.93* 4.86* 6.01* 6.06*  CALCIUM 9.7 9.4 9.3 8.9 9.3 9.5  PHOS 4.2  --   --   --   --   --      Liver Function Tests: Recent Labs  Lab 01/12/22 0926  ALBUMIN 3.1*    No results for input(s): "LIPASE", "AMYLASE" in the last 168 hours. No results for input(s): "AMMONIA" in the last 168 hours.  CBC: Recent Labs  Lab 01/12/22 0441 01/12/22 0926 01/13/22 0532 01/14/22 0507  WBC 5.1 4.7 4.4 5.9  HGB 8.0* 8.1* 8.2* 8.9*  HCT 23.9* 24.1* 24.7* 26.6*  MCV 101.7* 100.0 101.2* 102.7*  PLT 246 246 223 210     Cardiac Enzymes: No results for input(s): "CKTOTAL", "CKMB", "CKMBINDEX", "TROPONINI" in the last 168 hours.  BNP: Invalid input(s): "POCBNP"  CBG: Recent Labs  Lab 01/14/22 2047  GLUCAP 117*     Microbiology: Results for orders placed or performed during the hospital encounter of 12/15/21  SARS Coronavirus 2 by RT PCR (hospital order, performed in [facility] hospital lab) *cepheid single result test* Anterior Nasal Swab     Status: None   Collection Time: 12/15/21  5:50 PM   Specimen: Anterior Nasal Swab  Result Value Ref Range Status   SARS Coronavirus 2 by RT PCR NEGATIVE NEGATIVE Final    Comment: (NOTE) SARS-CoV-2 target nucleic acids are NOT DETECTED.  The SARS-CoV-2 RNA is generally detectable in upper and lower respiratory specimens during the acute phase of infection. The lowest concentration  of SARS-CoV-2 viral copies this assay can detect is 250 copies / mL. A negative result does not preclude SARS-CoV-2 infection and should not be used as the sole basis for treatment or other patient management decisions.  A negative result may occur with improper specimen collection / handling, submission of specimen other than nasopharyngeal swab, presence of viral mutation(s) within the areas targeted by this assay, and inadequate number of viral copies (<250 copies / mL). A negative result must be combined with clinical observations, patient history,  and epidemiological information.  Fact Sheet for Patients:   https://www.patel.info/  Fact Sheet for Healthcare Providers: https://hall.com/  This test is not yet approved or  cleared by the [place] FDA and has been authorized for detection and/or diagnosis of SARS-CoV-2 by FDA under an Emergency Use Authorization (EUA).  This EUA will remain in effect (meaning this test can be used) for the duration of the COVID-19 declaration under Section 564(b)(1) of the Act, 21 U.S.C. section 360bbb-3(b)(1), unless the authorization is terminated or revoked sooner.  Performed at [facility], [address] [place], [place] [address]   MRSA Next Gen by PCR, Nasal     Status: None   Collection Time: 12/15/21  9:03 PM   Specimen: Nasal Mucosa; Nasal Swab  Result Value Ref Range Status   MRSA by PCR Next Gen NOT DETECTED NOT DETECTED Final    Comment: (NOTE) The GeneXpert MRSA Assay (FDA approved for NASAL specimens only), is one component of a comprehensive MRSA colonization surveillance program. It is not intended to diagnose MRSA infection nor to guide or monitor treatment for MRSA infections. Test performance is not FDA approved in patients less than 79 years old. Performed at [facility], [address] [place], [place] [address]     Coagulation Studies: No results for input(s): "LABPROT", "INR" in the last 72 hours.  Urinalysis: No results for input(s): "COLORURINE", "LABSPEC", "PHURINE", "GLUCOSEU", "HGBUR", "BILIRUBINUR", "KETONESUR", "PROTEINUR", "UROBILINOGEN", "NITRITE", "LEUKOCYTESUR" in the last 72 hours.  Invalid input(s): "APPERANCEUR"    Imaging: No results found.   Medications:    anticoagulant sodium citrate      (feeding supplement) PROSource Plus  30 mL Oral TID BM   amLODipine  10 mg Oral q morning   calcium acetate  667 mg Oral TID WC   carbamazepine  1,000 mg Oral q AM    Chlorhexidine Gluconate Cloth  6 each Topical Daily   Chlorhexidine Gluconate Cloth  6 each Topical Q0600   cloNIDine  0.2 mg Transdermal Weekly   doxazosin  4 mg Oral QHS   epoetin (EPOGEN/PROCRIT) injection  10,000 Units Intravenous Q M,W,F-HD   ferrous sulfate  325 mg Oral Q breakfast   fluPHENAZine  2.5 mg Oral q AM   heparin  5,000 Units Subcutaneous Q8H   hydrALAZINE  10 mg Oral TID   irbesartan  300 mg Oral Daily   isosorbide mononitrate  60 mg Oral BID   minoxidil  10 mg Oral Daily   mometasone-formoterol  2 puff Inhalation BID   multivitamin  1 tablet Oral QHS   mouth rinse  15 mL Mouth Rinse 4 times per day   pantoprazole  40 mg Oral Daily   patiromer  16.8 g Oral Daily   polyethylene glycol  17 g Oral BID   trihexyphenidyl  2 mg Oral BID WC   albuterol, alteplase, anticoagulant sodium citrate, diphenhydrAMINE, haloperidol lactate, heparin, hydrALAZINE, lidocaine (PF), lidocaine-prilocaine, ondansetron **OR** ondansetron (ZOFRAN) IV, mouth rinse,  pentafluoroprop-tetrafluoroeth, polyethylene glycol, senna-docusate  Assessment/ Plan:  [name] is a 61 y.o.  male with past medical history including hypertension, anemia, schizophrenia and end-stage renal disease on hemodialysis.  Patient has presented to the ED with altered mental status.  Patient has been admitted for Altered mental status [R41.82] Hypertensive urgency [I16.0] Severe hypertension [I10] Altered mental status, unspecified altered mental status type [R41.82]  [facility]/MWF/Lt AVF  End-stage renal disease with hyperkalemia on hemodialysis.  Will maintain outpatient schedule if possible.   Next treatment scheduled for Monday.  Currently monitoring discharge plan to assess outpatient needs.     2. Anemia of chronic kidney disease Lab Results  Component Value Date   HGB 8.9 (L) 01/14/2022     Continue EPO with dialysis treatments.  We will obtain updated labs in AM.   3. Secondary  Hyperparathyroidism:  Lab Results  Component Value Date   CALCIUM 9.5 01/18/2022   PHOS 4.2 01/12/2022  We will continue to monitor bone minerals during this admission Calcium acetate restarted with meals.  4.  Hypertension with chronic kidney disease.  Home regimen includes amlodipine, clonidine, hydralazine, isosorbide, and losartan.    Currently on amlodipine 10 mg daily, clonidine patch 0.2 mg per 24 hours, Cardura 4 mg daily at bedtime, hydralazine 10 mg 3 times per day, Avapro 300 mg daily, isosorbide 60 mg twice a day, minoxidil 10 mg daily.      LOS: [place] 8/20/202312:34 PM

## 2022-01-18 NOTE — TOC Progression Note (Signed)
Transition of Care [facility]) - Progression Note    Patient Details  Name: Todd Brown MRN: [id number] Date of Birth: Oct 28, 1960  [facility] CM/SW Contact  Izola Price, RN Phone Number: 01/18/2022, 4:07 PM  Clinical Narrative: 8/20: Bed search choices remain all pending or considering status. Simmie Davies RN CM       Expected Discharge Plan: Skilled Nursing Facility Barriers to Discharge: Continued Medical Work up  Expected Discharge Plan and Services Expected Discharge Plan: [place] arrangements for the past 2 months: [place] Expected Discharge Date: 12/19/21                                     Social Determinants of Health (SDOH) Interventions    Readmission Risk Interventions     No data to display

## 2022-01-19 LAB — BASIC METABOLIC PANEL
Anion gap: 10 (ref 5–15)
BUN: 73 mg/dL — ABNORMAL HIGH (ref 8–23)
CO2: 29 mmol/L (ref 22–32)
Calcium: 8.8 mg/dL — ABNORMAL LOW (ref 8.9–10.3)
Chloride: 96 mmol/L — ABNORMAL LOW (ref 98–111)
Creatinine, Ser: 5.16 mg/dL — ABNORMAL HIGH (ref 0.61–1.24)
GFR, Estimated: 12 mL/min — ABNORMAL LOW (ref >60)
Glucose, Bld: 107 mg/dL — ABNORMAL HIGH (ref 70–99)
Potassium: 4.4 mmol/L (ref 3.5–5.1)
Sodium: 135 mmol/L (ref 135–145)

## 2022-01-19 MED ORDER — EPOETIN ALFA 10000 UNIT/ML IJ SOLN
INTRAMUSCULAR | Status: AC
  Filled 2022-01-19: qty 1

## 2022-01-19 NOTE — Progress Notes (Signed)
OT Cancellation Note  Patient Details Name: Todd Brown MRN: [id number] DOB: 13-Dec-1960   Cancelled Treatment:    Reason Eval/Treat Not Completed: Other (comment) Pt currently off floor for HD. Not available for session at this time. Will re-attempt next available time.  Lanelle Bal  [facility] 01/19/2022, 11:00 AM

## 2022-01-19 NOTE — Progress Notes (Signed)
PT Cancellation Note  Patient Details Name: Todd Brown MRN: [id number] DOB: 26-Feb-1961   Cancelled Treatment:    Reason Eval/Treat Not Completed: Other (comment). Pt currently off floor for HD. Not available for session at this time. Will re-attempt next available time.   Kemia Wendel 01/19/2022, 10:36 AM [name], PT, DPT, [facility] [phone]

## 2022-01-19 NOTE — Progress Notes (Signed)
Occupational Therapy Treatment Patient Details Name: Todd Brown MRN: [id number] DOB: 12/22/1960 Today's Date: 01/19/2022   History of present illness 61 year old male with past medical history of hypertension and end-stage renal disease on hemodialysis presented to the emergency room from his group home/skilled nursing facility on 7/17 with confusion.  (Patient's baseline is that he is unable to care for himself and has a legal guardian.)  Patient last received dialysis on Friday, 7/14.  From being in the emergency room on 7/17, unable to get dialysis yesterday.  Patient found to have markedly elevated blood pressure with a systolic in the 637C.  Patient with questionable cardiac ischemia as well   OT comments  Upon entering session, pt resting in bed. Pt demonstrated difficulty with answering yes/no questions and following one step commands. Pt tolerated bed level activities this date. Pt completing AAROM BUE exercises for elbow flexion/extension, wrist flexion/extension, finger flexion/extension. Pt unable to fully extend fingers. Pt required Max A to reposition in bed. OT providing Max verbal and tactile cues to get pt to wash his face, however, unsuccessful and required hand over hand assistance. Pt left resting in bed with all needs in reach. D/C recommendation remains appropriate. OT will continue to follow acutely.    Recommendations for follow up therapy are one component of a multi-disciplinary discharge planning process, led by the attending physician.  Recommendations may be updated based on patient status, additional functional criteria and insurance authorization.    Follow Up Recommendations  Skilled nursing-short term rehab (<3 hours/day)    Assistance Recommended at Discharge Frequent or constant Supervision/Assistance  Patient can return home with the following  A lot of help with bathing/dressing/bathroom;A lot of help with walking and/or transfers;Assistance with  feeding;Assist for transportation;Direct supervision/assist for medications management;Direct supervision/assist for financial management;Assistance with cooking/housework;Help with stairs or ramp for entrance   Equipment Recommendations  Other (comment) (defer to next venue of care)    Recommendations for Other Services      Precautions / Restrictions Precautions Precautions: Fall Precaution Comments: vision impaired Restrictions Weight Bearing Restrictions: No RLE Weight Bearing: Weight bearing as tolerated LLE Weight Bearing: Weight bearing as tolerated       Mobility Bed Mobility Overal bed mobility: Needs Assistance             General bed mobility comments: pt repositioned in bed with Max A    Transfers Overall transfer level: Needs assistance                 General transfer comment: not addressed this date, pt unable to follow one step commands     Balance Overall balance assessment: Needs assistance     Sitting balance - Comments: not addressed this date, pt unable to follow one step commands       Standing balance comment: not addressed this date, pt unable to follow one step commands                           ADL either performed or assessed with clinical judgement   ADL Overall ADL's : Needs assistance/impaired     Grooming: Sitting;Set up;Wash/dry face;Total assistance Grooming Details (indicate cue type and reason): Max VC to initiate, required hand over hand assistance                                    Extremity/Trunk Assessment Upper Extremity  Assessment Upper Extremity Assessment: Generalized weakness   Lower Extremity Assessment Lower Extremity Assessment: Generalized weakness        Vision Baseline Vision/History: 2 Legally blind Patient Visual Report: No change from baseline     Perception     Praxis      Cognition Arousal/Alertness: Awake/alert Behavior During Therapy: Flat  affect Overall Cognitive Status: No family/caregiver present to determine baseline cognitive functioning Area of Impairment: Orientation, Attention, Memory, Following commands, Safety/judgement, Awareness, Problem solving                 Orientation Level: Disoriented to, Situation Current Attention Level: Alternating Memory: Decreased short-term memory Following Commands: Follows one step commands inconsistently Safety/Judgement: Decreased awareness of safety, Decreased awareness of deficits Awareness: Intellectual Problem Solving: Slow processing, Decreased initiation, Difficulty sequencing, Requires verbal cues, Requires tactile cues General Comments: pt is alert however disoriented and visually impaired        Exercises      Shoulder Instructions       General Comments      Pertinent Vitals/ Pain       Pain Assessment Pain Assessment: No/denies pain  Home Living                                          Prior Functioning/Environment              Frequency  Min 2X/week        Progress Toward Goals  OT Goals(current goals can now be found in the care plan section)  Progress towards OT goals: Progressing toward goals  Acute Rehab OT Goals Patient Stated Goal: get better OT Goal Formulation: With patient  Plan Discharge plan remains appropriate;Frequency remains appropriate    Co-evaluation                 AM-PAC OT "6 Clicks" Daily Activity     Outcome Measure   Help from another person eating meals?: A Little Help from another person taking care of personal grooming?: A Little Help from another person toileting, which includes using toliet, bedpan, or urinal?: A Little Help from another person bathing (including washing, rinsing, drying)?: A Lot Help from another person to put on and taking off regular upper body clothing?: A Little Help from another person to put on and taking off regular lower body clothing?: A Lot 6  Click Score: 16    End of Session    OT Visit Diagnosis: Unsteadiness on feet (R26.81);Muscle weakness (generalized) (M62.81)   Activity Tolerance Patient limited by fatigue   Patient Left in bed;with call bell/phone within reach;with bed alarm set   Nurse Communication Mobility status        Time: 4496-7591 OT Time Calculation (min): 14 min  Charges: OT General Charges $OT Visit: 1 Visit OT Treatments $Self Care/Home Management : 8-22 mins  [facility] MS, OTR/L ascom [phone]  01/19/22, 6:02 PM

## 2022-01-19 NOTE — Progress Notes (Addendum)
[facility]  ROUNDING NOTE   Subjective:   Todd Brown is a 61 year old male with past medical history including hypertension, anemia, schizophrenia and end-stage renal disease on hemodialysis.  Patient has presented to the ED with altered mental status.  Patient has been admitted for Altered mental status [R41.82] Hypertensive urgency [I16.0] Severe hypertension [I10] Altered mental status, unspecified altered mental status type [R41.82]  Patient is known to our practice and receives outpatient dialysis treatments at [facility] on a MWF schedule.  Update: Patient seen and evaluated during dialysis   HEMODIALYSIS FLOWSHEET:  Blood Flow Rate (mL/min): 400 mL/min Arterial Pressure (mmHg): -90 mmHg Venous Pressure (mmHg): 250 mmHg TMP (mmHg): 10 mmHg Ultrafiltration Rate (mL/min): 731 mL/min Dialysate Flow Rate (mL/min): 300 ml/min Dialysis Fluid Bolus: Normal Saline Bolus Amount (mL): 300 mL  Tolerating treatment well.   Objective:  Vital signs in last 24 hours:  Temp:  [97.7 F (36.5 C)-98.9 F (37.2 C)] 97.9 F (36.6 C) (08/21 1345) Pulse Rate:  [63-69] 66 (08/21 1345) Resp:  [8-20] 11 (08/21 1345) BP: (119-153)/(69-79) 145/79 (08/21 1345) SpO2:  [96 %-100 %] 100 % (08/21 1030) Weight:  [88.2 kg] 88.2 kg (08/21 0952)  Weight change:  Filed Weights   01/16/22 1151 01/19/22 0940 01/19/22 0952  Weight: 87.9 kg 88.2 kg 88.2 kg    Intake/Output: No intake/output data recorded.   Intake/Output this shift:  No intake/output data recorded.  Physical Exam: General: NAD  Head: Normocephalic, atraumatic. Moist oral mucosal membranes  Eyes: Anicteric  Lungs:  Clear to auscultation, normal effort, room air  Heart: Regular rate and rhythm  Abdomen:  Soft, nontender, non distended  Extremities:  trace peripheral edema.  Neurologic: Alert, answer simple questions  Skin: No lesions  Access: Lt AVF     Basic Metabolic Panel: Recent Labs  Lab  01/14/22 0507 01/15/22 0411 01/16/22 0617 01/18/22 0922 01/19/22 1046  NA 135 137 136 136 135  K 6.0* 5.1 5.1 5.0 4.4  CL 98 98 98 95* 96*  CO2 28 29 29 30 29   GLUCOSE 71 74 79 92 107*  BUN 71* 57* 74* 77* 73*  CREATININE 5.93* 4.86* 6.01* 6.06* 5.16*  CALCIUM 9.3 8.9 9.3 9.5 8.8*     Liver Function Tests: No results for input(s): "AST", "ALT", "ALKPHOS", "BILITOT", "PROT", "ALBUMIN" in the last 168 hours.  No results for input(s): "LIPASE", "AMYLASE" in the last 168 hours. No results for input(s): "AMMONIA" in the last 168 hours.  CBC: Recent Labs  Lab 01/13/22 0532 01/14/22 0507  WBC 4.4 5.9  HGB 8.2* 8.9*  HCT 24.7* 26.6*  MCV 101.2* 102.7*  PLT 223 210     Cardiac Enzymes: No results for input(s): "CKTOTAL", "CKMB", "CKMBINDEX", "TROPONINI" in the last 168 hours.  BNP: Invalid input(s): "POCBNP"  CBG: Recent Labs  Lab 01/14/22 2047  GLUCAP 117*     Microbiology: Results for orders placed or performed during the hospital encounter of 12/15/21  SARS Coronavirus 2 by RT PCR (hospital order, performed in [facility] hospital lab) *cepheid single result test* Anterior Nasal Swab     Status: None   Collection Time: 12/15/21  5:50 PM   Specimen: Anterior Nasal Swab  Result Value Ref Range Status   SARS Coronavirus 2 by RT PCR NEGATIVE NEGATIVE Final    Comment: (NOTE) SARS-CoV-2 target nucleic acids are NOT DETECTED.  The SARS-CoV-2 RNA is generally detectable in upper and lower respiratory specimens during the acute phase of infection. The lowest  concentration of SARS-CoV-2 viral copies this assay can detect is 250 copies / mL. A negative result does not preclude SARS-CoV-2 infection and should not be used as the sole basis for treatment or other patient management decisions.  A negative result may occur with improper specimen collection / handling, submission of specimen other than nasopharyngeal swab, presence of viral mutation(s) within the areas  targeted by this assay, and inadequate number of viral copies (<250 copies / mL). A negative result must be combined with clinical observations, patient history, and epidemiological information.  Fact Sheet for Patients:   https://www.patel.info/  Fact Sheet for Healthcare Providers: https://hall.com/  This test is not yet approved or  cleared by the [place] FDA and has been authorized for detection and/or diagnosis of SARS-CoV-2 by FDA under an Emergency Use Authorization (EUA).  This EUA will remain in effect (meaning this test can be used) for the duration of the COVID-19 declaration under Section 564(b)(1) of the Act, 21 U.S.C. section 360bbb-3(b)(1), unless the authorization is terminated or revoked sooner.  Performed at [facility], [address] [name], [place] [address]   MRSA Next Gen by PCR, Nasal     Status: None   Collection Time: 12/15/21  9:03 PM   Specimen: Nasal Mucosa; Nasal Swab  Result Value Ref Range Status   MRSA by PCR Next Gen NOT DETECTED NOT DETECTED Final    Comment: (NOTE) The GeneXpert MRSA Assay (FDA approved for NASAL specimens only), is one component of a comprehensive MRSA colonization surveillance program. It is not intended to diagnose MRSA infection nor to guide or monitor treatment for MRSA infections. Test performance is not FDA approved in patients less than 23 years old. Performed at [facility], [address] [place], [place] [address]     Coagulation Studies: No results for input(s): "LABPROT", "INR" in the last 72 hours.  Urinalysis: No results for input(s): "COLORURINE", "LABSPEC", "PHURINE", "GLUCOSEU", "HGBUR", "BILIRUBINUR", "KETONESUR", "PROTEINUR", "UROBILINOGEN", "NITRITE", "LEUKOCYTESUR" in the last 72 hours.  Invalid input(s): "APPERANCEUR"    Imaging: No results found.   Medications:    anticoagulant sodium citrate      (feeding  supplement) PROSource Plus  30 mL Oral TID BM   amLODipine  10 mg Oral q morning   calcium acetate  667 mg Oral TID WC   carbamazepine  1,000 mg Oral q AM   Chlorhexidine Gluconate Cloth  6 each Topical Daily   Chlorhexidine Gluconate Cloth  6 each Topical Q0600   cloNIDine  0.2 mg Transdermal Weekly   doxazosin  4 mg Oral QHS   epoetin alfa       epoetin (EPOGEN/PROCRIT) injection  10,000 Units Intravenous Q M,W,F-HD   ferrous sulfate  325 mg Oral Q breakfast   fluPHENAZine  2.5 mg Oral q AM   heparin  5,000 Units Subcutaneous Q8H   hydrALAZINE  10 mg Oral TID   irbesartan  300 mg Oral Daily   isosorbide mononitrate  60 mg Oral BID   minoxidil  10 mg Oral Daily   mometasone-formoterol  2 puff Inhalation BID   multivitamin  1 tablet Oral QHS   mouth rinse  15 mL Mouth Rinse 4 times per day   pantoprazole  40 mg Oral Daily   patiromer  16.8 g Oral Daily   polyethylene glycol  17 g Oral BID   trihexyphenidyl  2 mg Oral BID WC   albuterol, alteplase, anticoagulant sodium citrate, diphenhydrAMINE, epoetin alfa, haloperidol lactate, heparin,  hydrALAZINE, lidocaine (PF), lidocaine-prilocaine, ondansetron **OR** ondansetron (ZOFRAN) IV, mouth rinse, pentafluoroprop-tetrafluoroeth, polyethylene glycol, senna-docusate  Assessment/ Plan:  [name] is a 61 y.o.  male with past medical history including hypertension, anemia, schizophrenia and end-stage renal disease on hemodialysis.  Patient has presented to the ED with altered mental status.  Patient has been admitted for Altered mental status [R41.82] Hypertensive urgency [I16.0] Severe hypertension [I10] Altered mental status, unspecified altered mental status type [R41.82]  [facility]/MWF/Lt AVF  End-stage renal disease with hyperkalemia on hemodialysis.  Will maintain outpatient schedule if possible.   Received dialysis today, UF goal 1L as tolerated. Currently monitoring discharge plan to assess outpatient needs.      2. Anemia of chronic kidney disease Lab Results  Component Value Date   HGB 8.9 (L) 01/14/2022     Continue EPO with dialysis treatments.  Awaiting labs   3. Secondary Hyperparathyroidism:  Lab Results  Component Value Date   CALCIUM 8.8 (L) 01/19/2022   PHOS 4.2 01/12/2022  Bone minerals within desired range. Calcium acetate with meals.  4.  Hypertension with chronic kidney disease.  Home regimen includes amlodipine, clonidine, hydralazine, isosorbide, and losartan.    Currently on amlodipine 10 mg daily, clonidine patch 0.2 mg per 24 hours, Cardura 4 mg daily at bedtime, hydralazine 10 mg 3 times per day, Avapro 300 mg daily, isosorbide 60 mg twice a day, minoxidil 10 mg daily.  Blood pressure stable    LOS: Brooks 8/21/20231:56 PM

## 2022-01-19 NOTE — Plan of Care (Signed)
Patient remained free of falls this shift. Patient ate well throughout shift, needs assist to feed. No complaints of pain. One loose BM this shift on bedside commode. Two assist with walker. Patient tolerated HD well this shift. Oral care completed. Repositioned in bed and chair. Will continue to monitor.

## 2022-01-19 NOTE — Progress Notes (Signed)
[facility] Progress Note  Patient: Todd Brown    EAV:[id number]  DOA: 12/15/2021    Date of Service: the patient was seen and examined on 01/19/2022  Brief hospital course: 61 year old male with past medical history of hypertension and end-stage renal disease on hemodialysis presented to the emergency room from his group home/Assisted living facility on 7/17 with confusion.  (Patient's baseline was that he is unable to care for himself and has a legal guardian.)  Patient last received dialysis on Friday, 7/14.  Patient found to have markedly elevated blood pressure with a systolic in the 782N requiring Cardene drip and placement of patient to the ICU.  Patient with questionable cardiac ischemia as well.  CT scan of head and neurological exam unremarkable.  Patient was admitted to the hospitalist service and started on his oral antihypertensives, nicardipine drip and given IV labetalol and as needed IV hydralazine.  Nephrology consulted and with plans to resume patient's dialysis.  Over the next few weeks, blood pressure better controlled.  At times became agitated and seen by psychiatry and started on scheduled and as needed Haldol, leading to more compliance with better mentation as well as taking his antihypertensives.  Hospital course complicated by episode of periorbital swelling bilaterally felt to be secondary to subcutaneous emphysema, resolved on steroids.  Also had episode of oral thrush treated with Magic mouthwash.  Patient's assisted living facility willing to take patient back after becomes much stronger at skilled nursing facility.  Skilled nursing facility search in progress.  Assessment and Plan: * Acute metabolic encephalopathy-resolved as of 12/31/2021 Felt to be secondary to malignant hypertension.  Resolved and now back at baseline.  Had some increased somnolence which improved after stopping his scheduled Benadryl and Haldol.  Hypertensive emergency-resolved as of  12/31/2021 Resolved.  See below.  ESRD on dialysis [facility]) Nephrology consulted, full dialysis session following admission and again today to keep him on his Monday Wednesday Friday schedule. -Continue scheduled dialysis  Mental health disorder Unspecified.  Patient is unable to care for himself.  It is unclear if he is ever had a formal diagnosis.  He is on Tegretol and Artane which he should continue.  As needed Haldol for acute behavior agitation as above.  He is for the most part much more calm and cooperative these days over times, he intermittently refused vitals being checked or taking his medications.  Essential hypertension Resumed on his home p.o. amlodipine 10 mg daily, clonidine 0.3 mg p.o. 3 times daily, hydralazine 100 mg p.o. 3 times daily, isosorbide mononitrate 60 mg p.o. twice daily, losartan 100 mg daily.  Pressures at time improved, but then blood pressure starts to spike again.  As above, changing ARB and adding alpha-blocker. -Nephrology added clonidine patches with p.o. clonidine. -Minoxidil was then added and titrated up to 10 mg daily.  During the first week of August, patient occasionally had episodes of lower blood pressure without any blood pressure spikes, so some of his newer medications were decreased: hydralazine and minoxidil was scaled back.  With blood pressure on the rise again, have increased minoxidil back to 10 mg and hydralazine up to 10 mg 3 times daily.  Pressures better after dialysis.  Oral thrush-resolved as of 01/02/2022 Improved with Magic mouthwash  Subcutaneous emphysema (HCC)-resolved as of 12/18/2021 On steroids.  Appreciate pulmonary assistance.  Subcutaneous emphysema looks to be resolved.  Changed from Solu-Medrol to p.o. prednisone quick taper.  Physical deconditioning PT OT are recommending SNF.  However, this is similar  to a previous hospitalization where TOC was unable to find skilled nursing and over time, patient's strength improved that he  was able to be discharged back to his assisted living facility.  Hyperkalemia Potassiums have been trending upward for the last 2 days and patient started on Veltassa.  Improving and today at 5.1.  Recheck labs in the morning.  Malnutrition of moderate degree Nutrition Status: Nutrition Problem: Moderate Malnutrition Etiology: chronic illness (ESRD on HD) Signs/Symptoms: mild fat depletion, mild muscle depletion, moderate muscle depletion Interventions: Refer to RD note for recommendations, Nepro shake, MVI, Prostat       Body mass index is 26.37 kg/m.  Nutrition Problem: Moderate Malnutrition Etiology: chronic illness (ESRD on HD)     Consultants: Nephrology Critical care Psychiatry  Procedures: Hemodialysis   Antimicrobials: None  Code Status: Full code   Subjective: Patient seen before hemodialysis, no complaints  Objective: Blood pressure remaining stable Vitals:   01/19/22 1200 01/19/22 1230  BP: (!) 143/79 (!) 147/71  Pulse: 68 67  Resp: 10 10  Temp:    SpO2:     No intake or output data in the 24 hours ending 01/19/22 1239     Filed Weights   01/16/22 1151 01/19/22 0940 01/19/22 0952  Weight: 87.9 kg 88.2 kg 88.2 kg   Body mass index is 26.37 kg/m.  Exam: Exam with no change from previous day General: Oriented x2, no acute distress HEENT: Normocephalic and atraumatic, mucous membranes moist Cardiovascular: Regular rate and rhythm, S1-S2, 2 out of 6 systolic ejection murmur Respiratory: Clear to auscultation bilaterally Abdomen: Soft, nontender, nondistended, hypoactive bowel sounds Musculoskeletal: No clubbing or cyanosis or edema Skin: No skin breaks, tears or lesions Psychiatry: Appropriate, no evidence of psychoses, flattened affect Neurology: Difficult to assess secondary to compliance  Data Reviewed: Potassium stable  Disposition:  Status is: Inpatient Remains inpatient appropriate because:  -Looking for short-term skilled  nursing  Anticipated discharge date: Unknown  Family Communication: No family, message left for guardian DVT Prophylaxis: heparin injection 5,000 Units Start: 12/15/21 1645 Place TED hose Start: 12/15/21 1629    Author: Annita Brod ,MD 01/19/2022 12:39 PM  To reach On-call, see care teams to locate the attending and reach out via [email or web address]. Between 7PM-7AM, please contact night-coverage If you still have difficulty reaching the attending provider, please page the [facility] (Director on Call) for [facility] on amion for assistance.

## 2022-01-20 DIAGNOSIS — G9341 Metabolic encephalopathy: Secondary | ICD-10-CM | POA: Diagnosis not present

## 2022-01-20 LAB — CBC
HCT: 27.2 % — ABNORMAL LOW (ref 39.0–52.0)
Hemoglobin: 9 g/dL — ABNORMAL LOW (ref 13.0–17.0)
MCH: 34.4 pg — ABNORMAL HIGH (ref 26.0–34.0)
MCHC: 33.1 g/dL (ref 30.0–36.0)
MCV: 103.8 fL — ABNORMAL HIGH (ref 80.0–100.0)
Platelets: 205 10*3/uL (ref 150–400)
RBC: 2.62 MIL/uL — ABNORMAL LOW (ref 4.22–5.81)
RDW: 17 % — ABNORMAL HIGH (ref 11.5–15.5)
WBC: 4.1 10*3/uL (ref 4.0–10.5)
nRBC: 0 % (ref 0.0–0.2)

## 2022-01-20 MED ORDER — NYSTATIN 100000 UNIT/ML MT SUSP
5.0000 mL | Freq: Four times a day (QID) | OROMUCOSAL | Status: DC
Start: 2022-01-20 — End: 2022-02-04
  Administered 2022-01-20 – 2022-02-03 (×52): 500000 [IU] via ORAL
  Filled 2022-01-20 (×51): qty 5

## 2022-01-20 NOTE — Progress Notes (Signed)
Physical Therapy Treatment Patient Details Name: Todd Brown MRN: [id number] DOB: 1961/01/22 Today's Date: 01/20/2022   History of Present Illness 61 year old male with past medical history of hypertension and end-stage renal disease on hemodialysis presented to the emergency room from his group home/skilled nursing facility on 7/17 with confusion.  (Patient's baseline is that he is unable to care for himself and has a legal guardian.)  Patient last received dialysis on Friday, 7/14.  From being in the emergency room on 7/17, unable to get dialysis yesterday.  Patient found to have markedly elevated blood pressure with a systolic in the 102H.  Patient with questionable cardiac ischemia as well    PT Comments    Limited session as pt was unable to do more than groan when asked any questions, cuing during PROM to attempt some AAROM effort were fruitless and even with excessive and multi-modal encouragement and cuing he simply could not get any meaningful movement out of either LE.  Spoke with nurse about this apparent change in mental status.  (Pt new to this PT, but prior documentation indicated that he was/is able to participate with some activity over the last month)    Recommendations for follow up therapy are one component of a multi-disciplinary discharge planning process, led by the attending physician.  Recommendations may be updated based on patient status, additional functional criteria and insurance authorization.  Follow Up Recommendations  Skilled nursing-short term rehab (<3 hours/day) Can patient physically be transported by private vehicle: No   Assistance Recommended at Discharge Frequent or constant Supervision/Assistance  Patient can return home with the following Two people to help with walking and/or transfers;Two people to help with bathing/dressing/bathroom;Help with stairs or ramp for entrance;Assist for transportation;Assistance with cooking/housework   Equipment  Recommendations  Rolling walker (2 wheels);BSC/3in1;Wheelchair cushion (measurements PT);Wheelchair (measurements PT)    Recommendations for Other Services       Precautions / Restrictions Precautions Precautions: Fall Precaution Comments: vision impaired Restrictions RLE Weight Bearing: Weight bearing as tolerated LLE Weight Bearing: Weight bearing as tolerated     Mobility  Bed Mobility               General bed mobility comments: Pt showed essentially no AROM effort with heavily cued LE exercises, unable to interact in a way that makes mobility particularly safe - deferred mobility this date    Transfers                        Ambulation/Gait                   Stairs             Wheelchair Mobility    Modified Rankin (Stroke Patients Only)       Balance                                            Cognition Arousal/Alertness: Lethargic Behavior During Therapy: Flat affect Overall Cognitive Status: Difficult to assess                                          Exercises General Exercises - Lower Extremity Ankle Circles/Pumps: PROM, 15 reps, Both Short Arc Quad: PROM, Both, 5 reps Heel Slides: PROM,  Both, 10 reps Hip ABduction/ADduction: PROM, 10 reps    General Comments        Pertinent Vitals/Pain Pain Assessment Facial Expression: smiling or inexpressive    Home Living                          Prior Function            PT Goals (current goals can now be found in the care plan section) Progress towards PT goals: Not progressing toward goals - comment (pt only groaning and essentially no effort for AAROM LE exercises despite excessive cuing and encouragement)    Frequency    Min 2X/week      PT Plan Current plan remains appropriate    Co-evaluation              AM-PAC PT "6 Clicks" Mobility   Outcome Measure  Help needed turning from your back to your  side while in a flat bed without using bedrails?: Total Help needed moving from lying on your back to sitting on the side of a flat bed without using bedrails?: Total Help needed moving to and from a bed to a chair (including a wheelchair)?: Total Help needed standing up from a chair using your arms (e.g., wheelchair or bedside chair)?: Total Help needed to walk in hospital room?: Total Help needed climbing 3-5 steps with a railing? : Total 6 Click Score: 6    End of Session   Activity Tolerance:  (limited by cognition) Patient left: with bed alarm set;with call bell/phone within reach Nurse Communication:  (inability to attempt single step commands, no real verbalization) PT Visit Diagnosis: Other abnormalities of gait and mobility (R26.89);Muscle weakness (generalized) (M62.81);Unsteadiness on feet (R26.81)     Time: 2637-8588 PT Time Calculation (min) (ACUTE ONLY): 13 min  Charges:  $Therapeutic Exercise: 8-22 mins                     Kreg Shropshire, DPT 01/20/2022, 5:12 PM

## 2022-01-20 NOTE — Progress Notes (Signed)
[facility] Progress Note  Patient: Todd Brown    QQV:[id number]  DOA: 12/15/2021    Date of Service: the patient was seen and examined on 01/20/2022  Brief hospital course: 61 year old male with past medical history of hypertension and end-stage renal disease on hemodialysis presented to the emergency room from his group home/Assisted living facility on 7/17 with confusion.  (Patient's baseline was that he is unable to care for himself and has a legal guardian.)  Patient last received dialysis on Friday, 7/14.  Patient found to have markedly elevated blood pressure with a systolic in the 332R requiring Cardene drip and placement of patient to the ICU.  Patient with questionable cardiac ischemia as well.  CT scan of head and neurological exam unremarkable.  Patient was admitted to the hospitalist service and started on his oral antihypertensives, nicardipine drip and given IV labetalol and as needed IV hydralazine.  Nephrology consulted and with plans to resume patient's dialysis.  Over the next few weeks, blood pressure better controlled.  At times became agitated and seen by psychiatry and started on scheduled and as needed Haldol, leading to more compliance with better mentation as well as taking his antihypertensives.  Hospital course complicated by episode of periorbital swelling bilaterally felt to be secondary to subcutaneous emphysema, resolved on steroids.  Also had episode of oral thrush treated with Magic mouthwash.  Patient's assisted living facility willing to take patient back after becomes much stronger at skilled nursing facility.  Skilled nursing facility search in progress.  Assessment and Plan: * Acute metabolic encephalopathy-resolved as of 12/31/2021 Felt to be secondary to malignant hypertension.  Resolved and now back at baseline.  Had some increased somnolence which improved after stopping his scheduled Benadryl and Haldol.  Hypertensive emergency-resolved as of  12/31/2021 Resolved.  See below.  ESRD on dialysis [facility]) Nephrology consulted, full dialysis session following admission and again today to keep him on his Monday Wednesday Friday schedule. -Continue scheduled dialysis  Mental health disorder Unspecified.  Patient is unable to care for himself.  It is unclear if he is ever had a formal diagnosis.  He is on Tegretol and Artane which he should continue.  As needed Haldol for acute behavior agitation as above.  He is for the most part much more calm and cooperative these days over times, he intermittently refused vitals being checked or taking his medications.  Essential hypertension Resumed on his home p.o. amlodipine 10 mg daily, clonidine 0.3 mg p.o. 3 times daily, hydralazine 100 mg p.o. 3 times daily, isosorbide mononitrate 60 mg p.o. twice daily, losartan 100 mg daily.  Pressures at time improved, but then blood pressure starts to spike again.  As above, changing ARB and adding alpha-blocker. -Nephrology added clonidine patches with p.o. clonidine. -Minoxidil was then added and titrated up to 10 mg daily.  During the first week of August, patient occasionally had episodes of lower blood pressure without any blood pressure spikes, so some of his newer medications were decreased: hydralazine and minoxidil was scaled back.  With blood pressure on the rise again, have increased minoxidil back to 10 mg and hydralazine up to 10 mg 3 times daily.  Pressures better after dialysis.  Oral thrush-resolved as of 01/02/2022 Improved with Magic mouthwash  Subcutaneous emphysema (HCC)-resolved as of 12/18/2021 On steroids.  Appreciate pulmonary assistance.  Subcutaneous emphysema looks to be resolved.  Changed from Solu-Medrol to p.o. prednisone quick taper.  Physical deconditioning PT OT are recommending SNF.  However, this is similar  to a previous hospitalization where TOC was unable to find skilled nursing and over time, patient's strength improved that he  was able to be discharged back to his assisted living facility.  Hyperkalemia Potassiums have been trending upward for the last 2 days and patient started on Veltassa.  Improving and today at 5.1.  Recheck labs in the morning.  Malnutrition of moderate degree Nutrition Status: Nutrition Problem: Moderate Malnutrition Etiology: chronic illness (ESRD on HD) Signs/Symptoms: mild fat depletion, mild muscle depletion, moderate muscle depletion Interventions: Refer to RD note for recommendations, Nepro shake, MVI, Prostat       Body mass index is 25.74 kg/m.  Nutrition Problem: Moderate Malnutrition Etiology: chronic illness (ESRD on HD)     Consultants: Nephrology Critical care Psychiatry  Procedures: Hemodialysis   Antimicrobials: None  Code Status: Full code   Subjective: Patient seen asleep sitting up in bed. Woke easily to voice, denies acute complaints. Very quickly drifts back to sleep  Objective: Blood pressure remaining stable Vitals:   01/20/22 0558 01/20/22 0857  BP: (!) 113/57 (!) 155/77  Pulse: 70 71  Resp: 18 (!) 22  Temp: 98.3 F (36.8 C) 98.3 F (36.8 C)  SpO2: 94% 93%    Intake/Output Summary (Last 24 hours) at 01/20/2022 1241 Last data filed at 01/20/2022 1039 Gross per 24 hour  Intake 960 ml  Output 2000 ml  Net -1040 ml      Filed Weights   01/19/22 0940 01/19/22 0952 01/19/22 1354  Weight: 88.2 kg 88.2 kg 86.1 kg   Body mass index is 25.74 kg/m.  Exam: General exam: awake but drowsy, quickly falls asleep, no acute distress HEENT: moist mucus membranes, hearing grossly normal  Respiratory system: CTAB with diminished bases, no wheezes, normal respiratory effort. Cardiovascular system: normal S1/S2, RRR.   Gastrointestinal system: soft, NT, ND Central nervous system: exam limited by pt not following commands Extremities: no edema, normal tone Skin: dry, intact, normal temperature Psychiatry: normal mood, flat affect   Data  Reviewed: CBC with stable Hbg 9.0 from 8.9  Disposition:  Status is: Inpatient Remains inpatient appropriate because:  -Looking for short-term skilled nursing  Anticipated discharge date: Unknown  Family Communication: None   DVT Prophylaxis: heparin injection 5,000 Units Start: 12/15/21 1645 Place TED hose Start: 12/15/21 1629    Author: Ezekiel Slocumb ,DO 01/20/2022 12:41 PM  To reach On-call, see care teams to locate the attending and reach out via [email or web address]. Between 7PM-7AM, please contact night-coverage If you still have difficulty reaching the attending provider, please page the [facility] (Director on Call) for [facility] on amion for assistance.

## 2022-01-20 NOTE — Progress Notes (Signed)
[facility]  ROUNDING NOTE   Subjective:   Todd Brown is a 61 year old male with past medical history including hypertension, anemia, schizophrenia and end-stage renal disease on hemodialysis.  Patient has presented to the ED with altered mental status.  Patient has been admitted for Altered mental status [R41.82] Hypertensive urgency [I16.0] Severe hypertension [I10] Altered mental status, unspecified altered mental status type [R41.82]  Patient is known to our practice and receives outpatient dialysis treatments at [facility] on a MWF schedule.  Patient sitting up in bed Tolerating meals without nausea and vomiting Denies pain and discomfort Trace lower extremity edema  Objective:  Vital signs in last 24 hours:  Temp:  [97.5 F (36.4 C)-98.3 F (36.8 C)] 98.3 F (36.8 C) (08/22 0857) Pulse Rate:  [59-74] 71 (08/22 0857) Resp:  [14-22] 22 (08/22 0857) BP: (101-173)/(57-86) 155/77 (08/22 0857) SpO2:  [76 %-100 %] 93 % (08/22 0857)  Weight change:  Filed Weights   01/19/22 0940 01/19/22 0952 01/19/22 1354  Weight: 88.2 kg 88.2 kg 86.1 kg    Intake/Output: I/O last 3 completed shifts: In: 1 [P.O.:960] Out: 2000 [Other:2000]   Intake/Output this shift:  Total I/O In: 720 [P.O.:720] Out: -   Physical Exam: General: NAD  Head: Normocephalic, atraumatic. Moist oral mucosal membranes  Eyes: Anicteric  Lungs:  Clear to auscultation, normal effort, room air  Heart: Regular rate and rhythm  Abdomen:  Soft, nontender, non distended  Extremities:  trace peripheral edema.  Neurologic: Alert, answer simple questions  Skin: No lesions  Access: Lt AVF     Basic Metabolic Panel: Recent Labs  Lab 01/14/22 0507 01/15/22 0411 01/16/22 0617 01/18/22 0922 01/19/22 1046  NA 135 137 136 136 135  K 6.0* 5.1 5.1 5.0 4.4  CL 98 98 98 95* 96*  CO2 28 29 29 30 29   GLUCOSE 71 74 79 92 107*  BUN 71* 57* 74* 77* 73*  CREATININE 5.93* 4.86* 6.01* 6.06*  5.16*  CALCIUM 9.3 8.9 9.3 9.5 8.8*     Liver Function Tests: No results for input(s): "AST", "ALT", "ALKPHOS", "BILITOT", "PROT", "ALBUMIN" in the last 168 hours.  No results for input(s): "LIPASE", "AMYLASE" in the last 168 hours. No results for input(s): "AMMONIA" in the last 168 hours.  CBC: Recent Labs  Lab 01/14/22 0507 01/20/22 0637  WBC 5.9 4.1  HGB 8.9* 9.0*  HCT 26.6* 27.2*  MCV 102.7* 103.8*  PLT 210 205     Cardiac Enzymes: No results for input(s): "CKTOTAL", "CKMB", "CKMBINDEX", "TROPONINI" in the last 168 hours.  BNP: Invalid input(s): "POCBNP"  CBG: Recent Labs  Lab 01/14/22 2047  GLUCAP 117*     Microbiology: Results for orders placed or performed during the hospital encounter of 12/15/21  SARS Coronavirus 2 by RT PCR (hospital order, performed in [facility] hospital lab) *cepheid single result test* Anterior Nasal Swab     Status: None   Collection Time: 12/15/21  5:50 PM   Specimen: Anterior Nasal Swab  Result Value Ref Range Status   SARS Coronavirus 2 by RT PCR NEGATIVE NEGATIVE Final    Comment: (NOTE) SARS-CoV-2 target nucleic acids are NOT DETECTED.  The SARS-CoV-2 RNA is generally detectable in upper and lower respiratory specimens during the acute phase of infection. The lowest concentration of SARS-CoV-2 viral copies this assay can detect is 250 copies / mL. A negative result does not preclude SARS-CoV-2 infection and should not be used as the sole basis for treatment or other patient  management decisions.  A negative result may occur with improper specimen collection / handling, submission of specimen other than nasopharyngeal swab, presence of viral mutation(s) within the areas targeted by this assay, and inadequate number of viral copies (<250 copies / mL). A negative result must be combined with clinical observations, patient history, and epidemiological information.  Fact Sheet for Patients:    https://www.patel.info/  Fact Sheet for Healthcare Providers: https://hall.com/  This test is not yet approved or  cleared by the [place] FDA and has been authorized for detection and/or diagnosis of SARS-CoV-2 by FDA under an Emergency Use Authorization (EUA).  This EUA will remain in effect (meaning this test can be used) for the duration of the COVID-19 declaration under Section 564(b)(1) of the Act, 21 U.S.C. section 360bbb-3(b)(1), unless the authorization is terminated or revoked sooner.  Performed at [facility], [address] [place], [place] [address]   MRSA Next Gen by PCR, Nasal     Status: None   Collection Time: 12/15/21  9:03 PM   Specimen: Nasal Mucosa; Nasal Swab  Result Value Ref Range Status   MRSA by PCR Next Gen NOT DETECTED NOT DETECTED Final    Comment: (NOTE) The GeneXpert MRSA Assay (FDA approved for NASAL specimens only), is one component of a comprehensive MRSA colonization surveillance program. It is not intended to diagnose MRSA infection nor to guide or monitor treatment for MRSA infections. Test performance is not FDA approved in patients less than 10 years old. Performed at [facility], [address] [place], [place] [address]     Coagulation Studies: No results for input(s): "LABPROT", "INR" in the last 72 hours.  Urinalysis: No results for input(s): "COLORURINE", "LABSPEC", "PHURINE", "GLUCOSEU", "HGBUR", "BILIRUBINUR", "KETONESUR", "PROTEINUR", "UROBILINOGEN", "NITRITE", "LEUKOCYTESUR" in the last 72 hours.  Invalid input(s): "APPERANCEUR"    Imaging: No results found.   Medications:    anticoagulant sodium citrate      (feeding supplement) PROSource Plus  30 mL Oral TID BM   amLODipine  10 mg Oral q morning   calcium acetate  667 mg Oral TID WC   carbamazepine  1,000 mg Oral q AM   Chlorhexidine Gluconate Cloth  6 each Topical Daily   Chlorhexidine  Gluconate Cloth  6 each Topical Q0600   cloNIDine  0.2 mg Transdermal Weekly   doxazosin  4 mg Oral QHS   epoetin (EPOGEN/PROCRIT) injection  10,000 Units Intravenous Q M,W,F-HD   ferrous sulfate  325 mg Oral Q breakfast   fluPHENAZine  2.5 mg Oral q AM   heparin  5,000 Units Subcutaneous Q8H   hydrALAZINE  10 mg Oral TID   irbesartan  300 mg Oral Daily   isosorbide mononitrate  60 mg Oral BID   minoxidil  10 mg Oral Daily   mometasone-formoterol  2 puff Inhalation BID   multivitamin  1 tablet Oral QHS   mouth rinse  15 mL Mouth Rinse 4 times per day   pantoprazole  40 mg Oral Daily   patiromer  16.8 g Oral Daily   polyethylene glycol  17 g Oral BID   trihexyphenidyl  2 mg Oral BID WC   albuterol, alteplase, anticoagulant sodium citrate, diphenhydrAMINE, haloperidol lactate, heparin, hydrALAZINE, lidocaine (PF), lidocaine-prilocaine, ondansetron **OR** ondansetron (ZOFRAN) IV, mouth rinse, pentafluoroprop-tetrafluoroeth, polyethylene glycol, senna-docusate  Assessment/ Plan:  Todd Brown is a 61 y.o.  male with past medical history including hypertension, anemia, schizophrenia and end-stage renal disease on hemodialysis.  Patient has presented  to the ED with altered mental status.  Patient has been admitted for Altered mental status [R41.82] Hypertensive urgency [I16.0] Severe hypertension [I10] Altered mental status, unspecified altered mental status type [R41.82]  [facility]/MWF/Lt AVF  End-stage renal disease with hyperkalemia on hemodialysis.  Will maintain outpatient schedule if possible.   Next treatment scheduled for Wednesday     2. Anemia of chronic kidney disease Lab Results  Component Value Date   HGB 9.0 (L) 01/20/2022     Continue EPO with dialysis treatments.     3. Secondary Hyperparathyroidism:  Lab Results  Component Value Date   CALCIUM 8.8 (L) 01/19/2022   PHOS 4.2 01/12/2022  Calcium and phosphorus acceptable Calcium acetate with  meals.  4.  Hypertension with chronic kidney disease.  Home regimen includes amlodipine, clonidine, hydralazine, isosorbide, and losartan.    Currently on amlodipine 10 mg daily, clonidine patch 0.2 mg per 24 hours, Cardura 4 mg daily at bedtime, hydralazine 10 mg 3 times per day, Avapro 300 mg daily, isosorbide 60 mg twice a day, minoxidil 10 mg daily.  Blood pressure 155/77    LOS: 36   8/22/20232:48 PM

## 2022-01-21 DIAGNOSIS — D638 Anemia in other chronic diseases classified elsewhere: Secondary | ICD-10-CM | POA: Diagnosis present

## 2022-01-21 DIAGNOSIS — G9341 Metabolic encephalopathy: Secondary | ICD-10-CM | POA: Diagnosis not present

## 2022-01-21 DIAGNOSIS — D631 Anemia in chronic kidney disease: Secondary | ICD-10-CM | POA: Diagnosis present

## 2022-01-21 MED ORDER — EPOETIN ALFA 10000 UNIT/ML IJ SOLN
INTRAMUSCULAR | Status: AC
  Administered 2022-01-21: 10000 [IU] via INTRAVENOUS
  Filled 2022-01-21: qty 1

## 2022-01-21 NOTE — Progress Notes (Signed)
Pt HD complete w/ no complications. Pt alert, no c/o, vss, report to primary RN. Start: 0909 End: 8850 2L fluid removed 84L BVP Epogen 10,000u w/ HD. 84Kg bed post HD weight

## 2022-01-21 NOTE — Progress Notes (Signed)
Post HD RN assessment 

## 2022-01-21 NOTE — Progress Notes (Signed)
[facility]  ROUNDING NOTE   Subjective:   Todd Brown is a 61 year old male with past medical history including hypertension, anemia, schizophrenia and end-stage renal disease on hemodialysis.  Patient has presented to the ED with altered mental status.  Patient has been admitted for Altered mental status [R41.82] Hypertensive urgency [I16.0] Severe hypertension [I10] Altered mental status, unspecified altered mental status type [R41.82]  Patient is known to our practice and receives outpatient dialysis treatments at [facility] on a MWF schedule.  Patient seen and evaluated during dialysis   HEMODIALYSIS FLOWSHEET:  Blood Flow Rate (mL/min): 400 mL/min Arterial Pressure (mmHg): -120 mmHg Venous Pressure (mmHg): 240 mmHg TMP (mmHg): 3 mmHg Ultrafiltration Rate (mL/min): 828 mL/min Dialysate Flow Rate (mL/min): 300 ml/min Dialysis Fluid Bolus: Normal Saline Bolus Amount (mL): 300 mL  Patient resting well, tolerating treatment. No complaints at this time  Objective:  Vital signs in last 24 hours:  Temp:  [97.7 F (36.5 C)-99.9 F (37.7 C)] 98.4 F (36.9 C) (08/23 0855) Pulse Rate:  [56-79] 57 (08/23 1130) Resp:  [10-17] 11 (08/23 1130) BP: (102-147)/(57-75) 117/67 (08/23 1130) SpO2:  [97 %-100 %] 100 % (08/23 1130) Weight:  [88.8 kg] 88.8 kg (08/23 0855)  Weight change:  Filed Weights   01/19/22 0952 01/19/22 1354 01/21/22 0855  Weight: 88.2 kg 86.1 kg 88.8 kg    Intake/Output: I/O last 3 completed shifts: In: 720 [P.O.:720] Out: -    Intake/Output this shift:  No intake/output data recorded.  Physical Exam: General: NAD  Head: Normocephalic, atraumatic. Moist oral mucosal membranes  Eyes: Anicteric  Lungs:  Clear to auscultation, normal effort, room air  Heart: Regular rate and rhythm  Abdomen:  Soft, nontender, non distended  Extremities:  trace peripheral edema.  Neurologic: Alert, answer simple questions  Skin: No lesions   Access: Lt AVF     Basic Metabolic Panel: Recent Labs  Lab 01/15/22 0411 01/16/22 0617 01/18/22 0922 01/19/22 1046  NA 137 136 136 135  K 5.1 5.1 5.0 4.4  CL 98 98 95* 96*  CO2 29 29 30 29   GLUCOSE 74 79 92 107*  BUN 57* 74* 77* 73*  CREATININE 4.86* 6.01* 6.06* 5.16*  CALCIUM 8.9 9.3 9.5 8.8*     Liver Function Tests: No results for input(s): "AST", "ALT", "ALKPHOS", "BILITOT", "PROT", "ALBUMIN" in the last 168 hours.  No results for input(s): "LIPASE", "AMYLASE" in the last 168 hours. No results for input(s): "AMMONIA" in the last 168 hours.  CBC: Recent Labs  Lab 01/20/22 0637  WBC 4.1  HGB 9.0*  HCT 27.2*  MCV 103.8*  PLT 205     Cardiac Enzymes: No results for input(s): "CKTOTAL", "CKMB", "CKMBINDEX", "TROPONINI" in the last 168 hours.  BNP: Invalid input(s): "POCBNP"  CBG: Recent Labs  Lab 01/14/22 2047  GLUCAP 117*     Microbiology: Results for orders placed or performed during the hospital encounter of 12/15/21  SARS Coronavirus 2 by RT PCR (hospital order, performed in [facility] hospital lab) *cepheid single result test* Anterior Nasal Swab     Status: None   Collection Time: 12/15/21  5:50 PM   Specimen: Anterior Nasal Swab  Result Value Ref Range Status   SARS Coronavirus 2 by RT PCR NEGATIVE NEGATIVE Final    Comment: (NOTE) SARS-CoV-2 target nucleic acids are NOT DETECTED.  The SARS-CoV-2 RNA is generally detectable in upper and lower respiratory specimens during the acute phase of infection. The lowest concentration of SARS-CoV-2 viral copies  this assay can detect is 250 copies / mL. A negative result does not preclude SARS-CoV-2 infection and should not be used as the sole basis for treatment or other patient management decisions.  A negative result may occur with improper specimen collection / handling, submission of specimen other than nasopharyngeal swab, presence of viral mutation(s) within the areas targeted by this assay,  and inadequate number of viral copies (<250 copies / mL). A negative result must be combined with clinical observations, patient history, and epidemiological information.  Fact Sheet for Patients:   https://www.patel.info/  Fact Sheet for Healthcare Providers: https://hall.com/  This test is not yet approved or  cleared by the [place] FDA and has been authorized for detection and/or diagnosis of SARS-CoV-2 by FDA under an Emergency Use Authorization (EUA).  This EUA will remain in effect (meaning this test can be used) for the duration of the COVID-19 declaration under Section 564(b)(1) of the Act, 21 U.S.C. section 360bbb-3(b)(1), unless the authorization is terminated or revoked sooner.  Performed at [facility], [address] [place], [place] [address]   MRSA Next Gen by PCR, Nasal     Status: None   Collection Time: 12/15/21  9:03 PM   Specimen: Nasal Mucosa; Nasal Swab  Result Value Ref Range Status   MRSA by PCR Next Gen NOT DETECTED NOT DETECTED Final    Comment: (NOTE) The GeneXpert MRSA Assay (FDA approved for NASAL specimens only), is one component of a comprehensive MRSA colonization surveillance program. It is not intended to diagnose MRSA infection nor to guide or monitor treatment for MRSA infections. Test performance is not FDA approved in patients less than 65 years old. Performed at [facility], [address] [place], [place] [address]     Coagulation Studies: No results for input(s): "LABPROT", "INR" in the last 72 hours.  Urinalysis: No results for input(s): "COLORURINE", "LABSPEC", "PHURINE", "GLUCOSEU", "HGBUR", "BILIRUBINUR", "KETONESUR", "PROTEINUR", "UROBILINOGEN", "NITRITE", "LEUKOCYTESUR" in the last 72 hours.  Invalid input(s): "APPERANCEUR"    Imaging: No results found.   Medications:    anticoagulant sodium citrate      (feeding supplement) PROSource Plus  30 mL  Oral TID BM   amLODipine  10 mg Oral q morning   calcium acetate  667 mg Oral TID WC   carbamazepine  1,000 mg Oral q AM   Chlorhexidine Gluconate Cloth  6 each Topical Daily   Chlorhexidine Gluconate Cloth  6 each Topical Q0600   cloNIDine  0.2 mg Transdermal Weekly   doxazosin  4 mg Oral QHS   epoetin (EPOGEN/PROCRIT) injection  10,000 Units Intravenous Q M,W,F-HD   ferrous sulfate  325 mg Oral Q breakfast   fluPHENAZine  2.5 mg Oral q AM   heparin  5,000 Units Subcutaneous Q8H   hydrALAZINE  10 mg Oral TID   irbesartan  300 mg Oral Daily   isosorbide mononitrate  60 mg Oral BID   minoxidil  10 mg Oral Daily   mometasone-formoterol  2 puff Inhalation BID   multivitamin  1 tablet Oral QHS   nystatin  5 mL Oral QID   mouth rinse  15 mL Mouth Rinse 4 times per day   pantoprazole  40 mg Oral Daily   patiromer  16.8 g Oral Daily   polyethylene glycol  17 g Oral BID   trihexyphenidyl  2 mg Oral BID WC   albuterol, alteplase, anticoagulant sodium citrate, diphenhydrAMINE, haloperidol lactate, heparin, hydrALAZINE, lidocaine (PF), lidocaine-prilocaine, ondansetron **OR** ondansetron (  ZOFRAN) IV, mouth rinse, pentafluoroprop-tetrafluoroeth, polyethylene glycol, senna-docusate  Assessment/ Plan:  [name] is a 61 y.o.  male with past medical history including hypertension, anemia, schizophrenia and end-stage renal disease on hemodialysis.  Patient has presented to the ED with altered mental status.  Patient has been admitted for Altered mental status [R41.82] Hypertensive urgency [I16.0] Severe hypertension [I10] Altered mental status, unspecified altered mental status type [R41.82]  [facility]/MWF/Lt AVF  End-stage renal disease with hyperkalemia on hemodialysis.  Will maintain outpatient schedule if possible.   Receiving dialysis, UF goal 2 L as tolerated.  Next treatment scheduled for Friday. Continuing to monitor discharge plan to include rehab to determine  adjustments with outpatient needs.     2. Anemia of chronic kidney disease Lab Results  Component Value Date   HGB 9.0 (L) 01/20/2022    Hemoglobin within desired target.  Continue EPO with dialysis treatments.     3. Secondary Hyperparathyroidism:  Lab Results  Component Value Date   CALCIUM 8.8 (L) 01/19/2022   PHOS 4.2 01/12/2022  Bone minerals are acceptable at this time. Calcium acetate with meals.  4.  Hypertension with chronic kidney disease.  Home regimen includes amlodipine, clonidine, hydralazine, isosorbide, and losartan.    Currently on amlodipine 10 mg daily, clonidine patch 0.2 mg per 24 hours, Cardura 4 mg daily at bedtime, hydralazine 10 mg 3 times per day, Avapro 300 mg daily, isosorbide 60 mg twice a day, minoxidil 10 mg daily.  Blood pressure 111/72 during dialysis.    LOS: [place] 8/23/202312:00 PM

## 2022-01-21 NOTE — Progress Notes (Signed)
HD complete 

## 2022-01-21 NOTE — Progress Notes (Signed)
Pre HD RN assessment 

## 2022-01-21 NOTE — Evaluation (Signed)
Occupational Therapy Evaluation Patient Details Name: Todd Brown MRN: [id number] DOB: 1960/10/31 Today's Date: 01/21/2022   History of Present Illness 61 year old male with past medical history of hypertension and end-stage renal disease on hemodialysis presented to the emergency room from his group home/skilled nursing facility on 7/17 with confusion.  (Patient's baseline is that he is unable to care for himself and has a legal guardian.)  Patient last received dialysis on Friday, 7/14.  From being in the emergency room on 7/17, unable to get dialysis yesterday.  Patient found to have markedly elevated blood pressure with a systolic in the 494W.  Patient with questionable cardiac ischemia as well   Clinical Impression   Chart reviewed to date, pt received in bed completing self-feeding with RN present. Agreeable to OT. Re-evaluation completed on this date. Pt is showing progress towards goals, goals have been updated. Pt currently functioning at STS with Min A, BSC transfer with Min guard, set up/supervision for self-feeding. Pt more alert and participatory in therapy this date. Pt is left as received and all needs met. D/C recommendation remains appropriate. OT will continue to follow acutely.      Recommendations for follow up therapy are one component of a multi-disciplinary discharge planning process, led by the attending physician.  Recommendations may be updated based on patient status, additional functional criteria and insurance authorization.   Follow Up Recommendations  Skilled nursing-short term rehab (<3 hours/day)    Assistance Recommended at Discharge Frequent or constant Supervision/Assistance  Patient can return home with the following A lot of help with bathing/dressing/bathroom;A lot of help with walking and/or transfers;Assistance with feeding;Assist for transportation;Direct supervision/assist for medications management;Direct supervision/assist for financial  management;Assistance with cooking/housework;Help with stairs or ramp for entrance    Functional Status Assessment  Patient has had a recent decline in their functional status and demonstrates the ability to make significant improvements in function in a reasonable and predictable amount of time.  Equipment Recommendations  Other (comment) (defer to next venue of care)    Recommendations for Other Services       Precautions / Restrictions Precautions Precautions: Fall Precaution Comments: vision impaired Restrictions RLE Weight Bearing: Weight bearing as tolerated LLE Weight Bearing: Weight bearing as tolerated      Mobility Bed Mobility Overal bed mobility: Needs Assistance Bed Mobility: Sit to Supine, Supine to Sit     Supine to sit: Mod assist, HOB elevated Sit to supine: Mod assist   General bed mobility comments: requires assist for initiation of bed mobility and multimodal cues for sequencing    Transfers Overall transfer level: Needs assistance Equipment used: Rolling walker (2 wheels) Transfers: Sit to/from Stand Sit to Stand: Min assist     Step pivot transfers: Min guard            Balance Overall balance assessment: Needs assistance Sitting-balance support: Feet supported Sitting balance-[name]: Fair     Standing balance support: Bilateral upper extremity supported, During functional activity Standing balance-[name]: Fair                             ADL either performed or assessed with clinical judgement   ADL Overall ADL's : Needs assistance/impaired Eating/Feeding: Set up;Sitting;Supervision/ safety Eating/Feeding Details (indicate cue type and reason): Pt completed self-feeding with set up/supervision. Max multimodal cues at times to help pt find bowl with spoon but then able to complete.  Lower Body Bathing: Supervison/ safety;Sitting/lateral leans Lower Body Bathing Details (indicate cue type and reason):  Pt able to adjust socks while sitting EOB         Toilet Transfer: Minimal assistance;BSC/3in1;Rolling walker (2 wheels) Toilet Transfer Details (indicate cue type and reason): step pivot transfer to [facility] with step by step verbal cues due to vision impairment, assist for hand placement Toileting- Clothing Manipulation and Hygiene: Sit to/from stand;Maximal assistance Toileting - Clothing Manipulation Details (indicate cue type and reason): Max A for posterior hygiene in standing             Vision Baseline Vision/History: 2 Legally blind Patient Visual Report: No change from baseline       Perception     Praxis      Pertinent Vitals/Pain Pain Assessment Pain Assessment: No/denies pain     Hand Dominance Right   Extremity/Trunk Assessment Upper Extremity Assessment Upper Extremity Assessment: Generalized weakness (Able to complete self-feeding with BUEs and move against gravity. Able to complete BUE AROM for elbow flex/ext, shoulder flex with Min verbal/tactile cues (able to hold in place, unable to tolerate more than minimal resistance-grossly 3/5)))   Lower Extremity Assessment Lower Extremity Assessment: Generalized weakness       Communication Communication Communication: HOH   Cognition Arousal/Alertness: Awake/alert Behavior During Therapy: Flat affect Overall Cognitive Status: Difficult to assess Area of Impairment: Orientation, Attention, Memory, Following commands, Safety/judgement, Awareness, Problem solving                 Orientation Level: Disoriented to, Situation, Time Current Attention Level: Alternating Memory: Decreased short-term memory Following Commands: Follows one step commands with increased time Safety/Judgement: Decreased awareness of safety, Decreased awareness of deficits Awareness: Intellectual Problem Solving: Slow processing, Decreased initiation, Difficulty sequencing, Requires verbal cues, Requires tactile cues General  Comments: pt is alert however disoriented and visually impaired     General Comments       Exercises     Shoulder Instructions      Home Living Family/patient expects to be discharged to:: Assisted living                                 Additional Comments: pt lives at [facility], per chart and previous admissionpt SPT to bed/mwc with CGA; Per chart review assist from ALF staff for ADL      Prior Functioning/Environment Prior Level of Function : Needs assist             Mobility Comments: SPT to WC at baseline          OT Problem List: Decreased strength;Decreased activity tolerance;Impaired balance (sitting and/or standing);Decreased safety awareness      OT Treatment/Interventions: Self-care/ADL training;Patient/family education;Therapeutic exercise;Energy conservation;DME and/or AE instruction;Therapeutic activities    OT Goals(Current goals can be found in the care plan section) Acute Rehab OT Goals Patient Stated Goal: get better OT Goal Formulation: With patient Time For Goal Achievement: 02/04/22 Potential to Achieve Goals: Fair ADL Goals Pt Will Perform Toileting - Clothing Manipulation and hygiene: with supervision;sit to/from stand  OT Frequency: Min 2X/week    Co-evaluation              AM-PAC OT "6 Clicks" Daily Activity     Outcome Measure Help from another person eating meals?: A Little Help from another person taking care of personal grooming?: A Little Help from another person toileting, which includes using toliet,  bedpan, or urinal?: A Lot Help from another person bathing (including washing, rinsing, drying)?: A Lot Help from another person to put on and taking off regular upper body clothing?: A Little Help from another person to put on and taking off regular lower body clothing?: A Little 6 Click Score: 16   End of Session Equipment Utilized During Treatment: Oxygen;Gait belt;Rolling walker (2 wheels) Nurse  Communication: Mobility status  Activity Tolerance: Patient tolerated treatment well Patient left: in bed;with call bell/phone within reach;with bed alarm set  OT Visit Diagnosis: Unsteadiness on feet (R26.81);Muscle weakness (generalized) (M62.81);Feeding difficulties (R63.3)                Time: 7001-7494 OT Time Calculation (min): 54 min Charges:  OT General Charges $OT Visit: 1 Visit OT Evaluation $OT Re-eval: 1 Re-eval OT Treatments $Self Care/Home Management : 8-22 mins  [facility] MS, OTR/L ascom [phone]  01/21/22, 5:42 PM

## 2022-01-21 NOTE — Progress Notes (Signed)
[facility] Progress Note  Patient: Todd Brown    XBW:[id number]  DOA: 12/15/2021    Date of Service: the patient was seen and examined on 01/21/2022  Brief hospital course: 61 year old male with past medical history of hypertension and end-stage renal disease on hemodialysis presented to the emergency room from his group home/Assisted living facility on 7/17 with confusion.  (Patient's baseline was that he is unable to care for himself and has a legal guardian.)  Patient last received dialysis on Friday, 7/14.    Patient found to have markedly elevated blood pressure with a systolic in the 163A requiring Cardene drip and placement of patient to the ICU.  Patient with questionable cardiac ischemia as well.  CT scan of head and neurological exam unremarkable.    Patient was admitted to the hospitalist service and started on his oral antihypertensives, nicardipine drip and given IV labetalol and as needed IV hydralazine.  Nephrology consulted and resumed patient's dialysis.  Over the next few weeks, blood pressure better controlled.    At times became agitated and seen by psychiatry and started on scheduled and as needed Haldol, leading to more compliance with better mentation as well as taking his antihypertensives.    Hospital course complicated by episode of periorbital swelling bilaterally felt to be secondary to subcutaneous emphysema, resolved on steroids.  Also had episode of oral thrush treated with Magic mouthwash.    Patient's assisted living facility willing to take patient back after becomes much stronger at skilled nursing facility.  Skilled nursing facility search in progress.  Assessment and Plan: * Acute metabolic encephalopathy-resolved as of 12/31/2021 Felt to be secondary to malignant hypertension.  Resolved and now back at baseline.  Had some increased somnolence which improved after stopping his scheduled Benadryl and Haldol.  Hypertensive emergency-resolved as of  12/31/2021 Resolved.  See below.  ESRD on dialysis [facility]) Nephrology consulted, full dialysis session following admission and again today to keep him on his Monday Wednesday Friday schedule. -Continue scheduled dialysis  Mental health disorder Unspecified.  Patient is unable to care for himself.  It is unclear if he is ever had a formal diagnosis.  He is on Tegretol and Artane which he should continue.  As needed Haldol for acute behavior agitation as above.  He is for the most part much more calm and cooperative these days over times, he intermittently refused vitals being checked or taking his medications.  Essential hypertension Resumed on his home p.o. amlodipine 10 mg daily, clonidine 0.3 mg p.o. 3 times daily, hydralazine 100 mg p.o. 3 times daily, isosorbide mononitrate 60 mg p.o. twice daily, losartan 100 mg daily.  Pressures at time improved, but then blood pressure starts to spike again.  As above, changing ARB and adding alpha-blocker. -Nephrology added clonidine patches with p.o. clonidine. -Minoxidil was then added and titrated up to 10 mg daily.  During the first week of August, patient occasionally had episodes of lower blood pressure without any blood pressure spikes, so some of his newer medications were decreased: hydralazine and minoxidil was scaled back.  With blood pressure on the rise again, have increased minoxidil back to 10 mg and hydralazine up to 10 mg 3 times daily.  Pressures better after dialysis.  Oral thrush 8/23: start nystatin suspension  Subcutaneous emphysema (HCC)-resolved as of 12/18/2021 On steroids.  Appreciate pulmonary assistance.  Subcutaneous emphysema looks to be resolved.  Changed from Solu-Medrol to p.o. prednisone quick taper.  Physical deconditioning PT OT are recommending SNF.  However, this is similar to a previous hospitalization where TOC was unable to find skilled nursing and over time, patient's strength improved that he was able to be  discharged back to his assisted living facility.  Anemia of chronic disease Hbg stable. Check CBC ~weekly.  Hyperkalemia Potassiums have been trending upward for the last 2 days and patient started on Veltassa.  Last K was 4.4 on 8/21.  Malnutrition of moderate degree Nutrition Status: Nutrition Problem: Moderate Malnutrition Etiology: chronic illness (ESRD on HD) Signs/Symptoms: mild fat depletion, mild muscle depletion, moderate muscle depletion Interventions: Refer to RD note for recommendations, Nepro shake, MVI, Prostat       Body mass index is 26.01 kg/m.  Nutrition Problem: Moderate Malnutrition Etiology: chronic illness (ESRD on HD)     Consultants: Nephrology Critical care Psychiatry  Procedures: Hemodialysis   Antimicrobials: None  Code Status: Full code   Subjective: Patient sleeping comfortably, woke briefly to voice. Denied pain, feeling sick or other complaints.  No acute events reported.   Objective: Vitals:   01/21/22 1244 01/21/22 1246  BP: 133/79 (!) 140/67  Pulse: (!) 54   Resp: 10   Temp: 97.8 F (36.6 C)   SpO2: 100%    Filed Weights   01/19/22 1354 01/21/22 0855 01/21/22 1244  Weight: 86.1 kg 88.8 kg 87 kg   Body mass index is 26.01 kg/m.  Exam: General exam: sleeping comfortably, wakes briefly, no acute distress Respiratory system: CTAB with diminished bases, no wheezes, normal respiratory effort. Cardiovascular system: normal S1/S2, RRR.   Gastrointestinal system: soft, non-distended Central nervous system: exam limited by pt not following commands Extremities: no edema, normal tone Skin: dry, intact, normal temperature Psychiatry: unable to assess due to somnolence   Data Reviewed: No new labs today   Disposition:  Status is: Inpatient Remains inpatient appropriate because:  --Awaiting SNF placement  Anticipated discharge date: Unknown  Family Communication: None   DVT Prophylaxis: heparin injection 5,000  Units Start: 12/15/21 1645 Place TED hose Start: 12/15/21 1629    Author: Ezekiel Slocumb ,DO 01/21/2022 1:53 PM  To reach On-call, see care teams to locate the attending and reach out via [email or web address]. Between 7PM-7AM, please contact night-coverage If you still have difficulty reaching the attending provider, please page the [facility] (Director on Call) for [facility] on amion for assistance.

## 2022-01-21 NOTE — Progress Notes (Signed)
PT Cancellation Note  Patient Details Name: Todd Brown MRN: [id number] DOB: Jul 19, 1960   Cancelled Treatment:    Reason Eval/Treat Not Completed: Patient at procedure or test/unavailable Pt not available out of room all morning for dialysis.  Kreg Shropshire, DPT 01/21/2022, 1:18 PM

## 2022-01-21 NOTE — Assessment & Plan Note (Signed)
Hbg stable. Check CBC ~weekly.

## 2022-01-21 NOTE — Progress Notes (Signed)
OT Cancellation Note  Patient Details Name: Todd Brown MRN: [id number] DOB: [birth year]   Cancelled Treatment:    Reason Eval/Treat Not Completed: Patient at procedure or test/ unavailable. Pt currently off the floor receiving dialysis. OT will re-attempt as available.  Lanelle Bal  [facility] 01/21/2022, 11:01 AM

## 2022-01-22 DIAGNOSIS — G9341 Metabolic encephalopathy: Secondary | ICD-10-CM | POA: Diagnosis not present

## 2022-01-22 NOTE — Plan of Care (Signed)
Patient remained free of falls this shift. No c/o pain. Patient ate well this shift, requires help setting up but able to feed self. Patient in chair, able to help reposition himself. PIV dressing changed. Oral care completed this shift. Will continue to monitor.

## 2022-01-22 NOTE — Progress Notes (Signed)
[facility] Progress Note  Patient: Todd Brown    JOA:[id number]  DOA: 12/15/2021    Date of Service: the patient was seen and examined on 01/22/2022  Brief hospital course: 61 year old male with past medical history of hypertension and end-stage renal disease on hemodialysis presented to the emergency room from his group home/Assisted living facility on 7/17 with confusion.  (Patient's baseline was that he is unable to care for himself and has a legal guardian.)  Patient last received dialysis on Friday, 7/14.    Patient found to have markedly elevated blood pressure with a systolic in the 601U requiring Cardene drip and placement of patient to the ICU.  Patient with questionable cardiac ischemia as well.  CT scan of head and neurological exam unremarkable.    Patient was admitted to the hospitalist service and started on his oral antihypertensives, nicardipine drip and given IV labetalol and as needed IV hydralazine.  Nephrology consulted and resumed patient's dialysis.  Over the next few weeks, blood pressure better controlled.    At times became agitated and seen by psychiatry and started on scheduled and as needed Haldol, leading to more compliance with better mentation as well as taking his antihypertensives.    Hospital course complicated by episode of periorbital swelling bilaterally felt to be secondary to subcutaneous emphysema, resolved on steroids.  Also had episode of oral thrush treated with Magic mouthwash.    Patient's assisted living facility willing to take patient back after becomes much stronger at skilled nursing facility.  Skilled nursing facility search in progress.  Assessment and Plan: * Acute metabolic encephalopathy-resolved as of 12/31/2021 Felt to be secondary to malignant hypertension.  Resolved and now back at baseline.  Had some increased somnolence which improved after stopping his scheduled Benadryl and Haldol.  Hypertensive emergency-resolved as of  12/31/2021 Resolved.  See below.  ESRD on dialysis [facility]) Nephrology consulted, full dialysis session following admission and again today to keep him on his Monday Wednesday Friday schedule. -Continue scheduled dialysis  Mental health disorder Unspecified.  Patient is unable to care for himself.  It is unclear if he is ever had a formal diagnosis.  He is on Tegretol and Artane which he should continue.  As needed Haldol for acute behavior agitation as above.  He is for the most part much more calm and cooperative these days over times, he intermittently refused vitals being checked or taking his medications.  Essential hypertension Resumed on his home p.o. amlodipine 10 mg daily, clonidine 0.3 mg p.o. 3 times daily, hydralazine 100 mg p.o. 3 times daily, isosorbide mononitrate 60 mg p.o. twice daily, losartan 100 mg daily.  Pressures at time improved, but then blood pressure starts to spike again.  As above, changing ARB and adding alpha-blocker. -Nephrology added clonidine patches with p.o. clonidine. -Minoxidil was then added and titrated up to 10 mg daily.  During the first week of August, patient occasionally had episodes of lower blood pressure without any blood pressure spikes, so some of his newer medications were decreased: hydralazine and minoxidil was scaled back.  With blood pressure on the rise again, have increased minoxidil back to 10 mg and hydralazine up to 10 mg 3 times daily.  Pressures better after dialysis.  Oral thrush 8/23: start nystatin suspension  Subcutaneous emphysema (HCC)-resolved as of 12/18/2021 On steroids.  Appreciate pulmonary assistance.  Subcutaneous emphysema looks to be resolved.  Changed from Solu-Medrol to p.o. prednisone quick taper.  Physical deconditioning PT OT are recommending SNF.  However, this is similar to a previous hospitalization where TOC was unable to find skilled nursing and over time, patient's strength improved that he was able to be  discharged back to his assisted living facility.  Anemia of chronic disease Hbg stable. Check CBC ~weekly.  Hyperkalemia Potassiums have been trending upward for the last 2 days and patient started on Veltassa.  Last K was 4.4 on 8/21.  Malnutrition of moderate degree Nutrition Status: Nutrition Problem: Moderate Malnutrition Etiology: chronic illness (ESRD on HD) Signs/Symptoms: mild fat depletion, mild muscle depletion, moderate muscle depletion Interventions: Refer to RD note for recommendations, Nepro shake, MVI, Prostat       Body mass index is 26.01 kg/m.  Nutrition Problem: Moderate Malnutrition Etiology: chronic illness (ESRD on HD)     Consultants: Nephrology Critical care Psychiatry  Procedures: Hemodialysis   Antimicrobials: None  Code Status: Full code   Subjective: Patient was awake sitting up in bed when seen on rounds.  He is asking for graham crackers and peanut butter.  Denies feeling sick, having pain or other acute complaints.  No acute events reported.  Objective: Vitals:   01/22/22 0439 01/22/22 0854  BP: 112/63 116/66  Pulse: 60 (!) 59  Resp: 18 14  Temp: 98 F (36.7 C) (!) 97.3 F (36.3 C)  SpO2: 98% 100%   Filed Weights   01/19/22 1354 01/21/22 0855 01/21/22 1244  Weight: 86.1 kg 88.8 kg 87 kg   Body mass index is 26.01 kg/m.  Exam: General exam: awake and alert, no acute distress Respiratory system: on room air, normal respiratory effort. Cardiovascular system: RRR, lower extremity edema Gastrointestinal system: soft, non-tender, mildly distended Central nervous system: exam limited by pt not following commands Extremities: no edema, normal tone Psychiatry: normal mood and affect   Data Reviewed: No new labs today   Disposition:  Status is: Inpatient Remains inpatient appropriate because:  --Awaiting SNF placement  Anticipated discharge date: Unknown  Family Communication: None   DVT Prophylaxis: heparin  injection 5,000 Units Start: 12/15/21 1645 Place TED hose Start: 12/15/21 1629    Author: Ezekiel Slocumb ,DO 01/22/2022 1:13 PM  To reach On-call, see care teams to locate the attending and reach out via [email or web address]. Between 7PM-7AM, please contact night-coverage If you still have difficulty reaching the attending provider, please page the [facility] (Director on Call) for [facility] on amion for assistance.

## 2022-01-22 NOTE — TOC Progression Note (Signed)
Transition of Care [facility]) - Progression Note    Patient Details  Name: Todd Brown MRN: [id number] Date of Birth: 09-15-1960  Transition of Care [facility]) CM/SW Contact  Laurena Slimmer, RN Phone Number: 01/22/2022, 2:04 PM  Clinical Narrative:    No current bed offers. Bed search extended.    Expected Discharge Plan: [place] Barriers to Discharge: Continued Medical Work up  Expected Discharge Plan and Services Expected Discharge Plan: [place] arrangements for the past 2 months: [place] Expected Discharge Date: 12/19/21                                     Social Determinants of Health (SDOH) Interventions    Readmission Risk Interventions     No data to display

## 2022-01-22 NOTE — Progress Notes (Addendum)
Patient has been discharged from [facility] due to missing 30 days of treatments at center. This means that patient will need to be referred to clinic as a new patient. Which means that patient's chair time and possibly clinic could change. I will continue to follow patient for dialysis discharge planning.

## 2022-01-22 NOTE — Progress Notes (Signed)
[facility]  ROUNDING NOTE   Subjective:   Todd Brown is a 61 year old male with past medical history including hypertension, anemia, schizophrenia and end-stage renal disease on hemodialysis.  Patient has presented to the ED with altered mental status.  Patient has been admitted for Altered mental status [R41.82] Hypertensive urgency [I16.0] Severe hypertension [I10] Altered mental status, unspecified altered mental status type [R41.82]  Patient is known to our practice and receives outpatient dialysis treatments at [facility] on a MWF schedule.  Patient seen sitting up in bed, alert and oriented Denies pain or discomfort Tolerating meals without nausea and vomiting Remains on room air  Received dialysis yesterday, tolerated well  Objective:  Vital signs in last 24 hours:  Temp:  [97.3 F (36.3 C)-98.2 F (36.8 C)] 97.3 F (36.3 C) (08/24 0854) Pulse Rate:  [54-67] 59 (08/24 0854) Resp:  [10-20] 14 (08/24 0854) BP: (112-146)/(60-79) 116/66 (08/24 0854) SpO2:  [98 %-100 %] 100 % (08/24 0854) Weight:  [87 kg] 87 kg (08/23 1244)  Weight change:  Filed Weights   01/19/22 1354 01/21/22 0855 01/21/22 1244  Weight: 86.1 kg 88.8 kg 87 kg    Intake/Output: I/O last 3 completed shifts: In: -  Out: 2000 [Other:2000]   Intake/Output this shift:  No intake/output data recorded.  Physical Exam: General: NAD  Head: Normocephalic, atraumatic. Moist oral mucosal membranes  Eyes: Anicteric  Lungs:  Clear to auscultation, normal effort, room air  Heart: Regular rate and rhythm  Abdomen:  Soft, nontender, non distended  Extremities:  trace peripheral edema.  Neurologic: Alert, answer simple questions  Skin: No lesions  Access: Lt AVF     Basic Metabolic Panel: Recent Labs  Lab 01/16/22 0617 01/18/22 0922 01/19/22 1046  NA 136 136 135  K 5.1 5.0 4.4  CL 98 95* 96*  CO2 29 30 29   GLUCOSE 79 92 107*  BUN 74* 77* 73*  CREATININE 6.01* 6.06*  5.16*  CALCIUM 9.3 9.5 8.8*     Liver Function Tests: No results for input(s): "AST", "ALT", "ALKPHOS", "BILITOT", "PROT", "ALBUMIN" in the last 168 hours.  No results for input(s): "LIPASE", "AMYLASE" in the last 168 hours. No results for input(s): "AMMONIA" in the last 168 hours.  CBC: Recent Labs  Lab 01/20/22 0637  WBC 4.1  HGB 9.0*  HCT 27.2*  MCV 103.8*  PLT 205     Cardiac Enzymes: No results for input(s): "CKTOTAL", "CKMB", "CKMBINDEX", "TROPONINI" in the last 168 hours.  BNP: Invalid input(s): "POCBNP"  CBG: No results for input(s): "GLUCAP" in the last 168 hours.   Microbiology: Results for orders placed or performed during the hospital encounter of 12/15/21  SARS Coronavirus 2 by RT PCR (hospital order, performed in [facility] hospital lab) *cepheid single result test* Anterior Nasal Swab     Status: None   Collection Time: 12/15/21  5:50 PM   Specimen: Anterior Nasal Swab  Result Value Ref Range Status   SARS Coronavirus 2 by RT PCR NEGATIVE NEGATIVE Final    Comment: (NOTE) SARS-CoV-2 target nucleic acids are NOT DETECTED.  The SARS-CoV-2 RNA is generally detectable in upper and lower respiratory specimens during the acute phase of infection. The lowest concentration of SARS-CoV-2 viral copies this assay can detect is 250 copies / mL. A negative result does not preclude SARS-CoV-2 infection and should not be used as the sole basis for treatment or other patient management decisions.  A negative result may occur with improper specimen collection / handling,  submission of specimen other than nasopharyngeal swab, presence of viral mutation(s) within the areas targeted by this assay, and inadequate number of viral copies (<250 copies / mL). A negative result must be combined with clinical observations, patient history, and epidemiological information.  Fact Sheet for Patients:   https://www.patel.info/  Fact Sheet for Healthcare  Providers: https://hall.com/  This test is not yet approved or  cleared by the [place] FDA and has been authorized for detection and/or diagnosis of SARS-CoV-2 by FDA under an Emergency Use Authorization (EUA).  This EUA will remain in effect (meaning this test can be used) for the duration of the COVID-19 declaration under Section 564(b)(1) of the Act, 21 U.S.C. section 360bbb-3(b)(1), unless the authorization is terminated or revoked sooner.  Performed at [facility], [address] [place], [place] [address]   MRSA Next Gen by PCR, Nasal     Status: None   Collection Time: 12/15/21  9:03 PM   Specimen: Nasal Mucosa; Nasal Swab  Result Value Ref Range Status   MRSA by PCR Next Gen NOT DETECTED NOT DETECTED Final    Comment: (NOTE) The GeneXpert MRSA Assay (FDA approved for NASAL specimens only), is one component of a comprehensive MRSA colonization surveillance program. It is not intended to diagnose MRSA infection nor to guide or monitor treatment for MRSA infections. Test performance is not FDA approved in patients less than 2 years old. Performed at [facility], [address] [place], [place] [address]     Coagulation Studies: No results for input(s): "LABPROT", "INR" in the last 72 hours.  Urinalysis: No results for input(s): "COLORURINE", "LABSPEC", "PHURINE", "GLUCOSEU", "HGBUR", "BILIRUBINUR", "KETONESUR", "PROTEINUR", "UROBILINOGEN", "NITRITE", "LEUKOCYTESUR" in the last 72 hours.  Invalid input(s): "APPERANCEUR"    Imaging: No results found.   Medications:    anticoagulant sodium citrate      (feeding supplement) PROSource Plus  30 mL Oral TID BM   amLODipine  10 mg Oral q morning   calcium acetate  667 mg Oral TID WC   carbamazepine  1,000 mg Oral q AM   Chlorhexidine Gluconate Cloth  6 each Topical Daily   Chlorhexidine Gluconate Cloth  6 each Topical Q0600   cloNIDine  0.2 mg Transdermal Weekly    doxazosin  4 mg Oral QHS   epoetin (EPOGEN/PROCRIT) injection  10,000 Units Intravenous Q M,W,F-HD   ferrous sulfate  325 mg Oral Q breakfast   fluPHENAZine  2.5 mg Oral q AM   heparin  5,000 Units Subcutaneous Q8H   hydrALAZINE  10 mg Oral TID   irbesartan  300 mg Oral Daily   isosorbide mononitrate  60 mg Oral BID   minoxidil  10 mg Oral Daily   mometasone-formoterol  2 puff Inhalation BID   multivitamin  1 tablet Oral QHS   nystatin  5 mL Oral QID   mouth rinse  15 mL Mouth Rinse 4 times per day   pantoprazole  40 mg Oral Daily   patiromer  16.8 g Oral Daily   polyethylene glycol  17 g Oral BID   trihexyphenidyl  2 mg Oral BID WC   albuterol, alteplase, anticoagulant sodium citrate, diphenhydrAMINE, haloperidol lactate, heparin, hydrALAZINE, lidocaine (PF), lidocaine-prilocaine, ondansetron **OR** ondansetron (ZOFRAN) IV, mouth rinse, pentafluoroprop-tetrafluoroeth, polyethylene glycol, senna-docusate  Assessment/ Plan:  Mr. Elmo Rio is a 61 y.o.  male with past medical history including hypertension, anemia, schizophrenia and end-stage renal disease on hemodialysis.  Patient has presented to the ED with altered mental  status.  Patient has been admitted for Altered mental status [R41.82] Hypertensive urgency [I16.0] Severe hypertension [I10] Altered mental status, unspecified altered mental status type [R41.82]  [facility] Hydro/MWF/Lt AVF  End-stage renal disease with hyperkalemia on hemodialysis.  Will maintain outpatient schedule if possible.   Received dialysis yesterday, UF 2 L achieved.  Next treatment scheduled for Friday. Due to extended hospitalization, patient has been discharged from outpatient dialysis clinic.  Renal navigator aware and will begin outpatient clinic search.     2. Anemia of chronic kidney disease Lab Results  Component Value Date   HGB 9.0 (L) 01/20/2022    Continue EPO with dialysis treatments.     3. Secondary Hyperparathyroidism:   Lab Results  Component Value Date   CALCIUM 8.8 (L) 01/19/2022   PHOS 4.2 01/12/2022  We will continue to monitor bone minerals. Calcium acetate with meals.  4.  Hypertension with chronic kidney disease.  Home regimen includes amlodipine, clonidine, hydralazine, isosorbide, and losartan.    Currently on amlodipine 10 mg daily, clonidine patch 0.2 mg per 24 hours, Cardura 4 mg daily at bedtime, hydralazine 10 mg 3 times per day, Avapro 300 mg daily, isosorbide 60 mg twice a day, minoxidil 10 mg daily.  Blood pressure currently 116/66.    LOS: [place] 8/24/202311:54 AM

## 2022-01-23 DIAGNOSIS — G9341 Metabolic encephalopathy: Secondary | ICD-10-CM | POA: Diagnosis not present

## 2022-01-23 MED ORDER — EPOETIN ALFA 10000 UNIT/ML IJ SOLN
INTRAMUSCULAR | Status: AC
  Administered 2022-01-23: 10000 [IU] via INTRAVENOUS
  Filled 2022-01-23: qty 1

## 2022-01-23 NOTE — Progress Notes (Signed)
Post HD RN assessment 

## 2022-01-23 NOTE — Progress Notes (Signed)
OT Cancellation Note  Patient Details Name: Gee Habig MRN: [id number] DOB: 1960/07/19   Cancelled Treatment:    Reason Eval/Treat Not Completed: Patient at procedure or test/ unavailable. Pt currently off the floor for HD and thus unavailable for OT tx. OT to re-attempt at late time/date as able.   Sharilyn Sites Kamdon Reisig 01/23/2022, 11:48 AM

## 2022-01-23 NOTE — Progress Notes (Signed)
Physical Therapy Treatment Patient Details Name: Todd Brown MRN: [id number] DOB: [birth year] Today's Date: 01/23/2022   History of Present Illness 61 year old male with past medical history of hypertension and end-stage renal disease on hemodialysis presented to the emergency room from his group home/skilled nursing facility on 7/17 with confusion.  (Patient's baseline is that he is unable to care for himself and has a legal guardian.)  Patient last received dialysis on Friday, 7/14.  From being in the emergency room on 7/17, unable to get dialysis yesterday.  Patient found to have markedly elevated blood pressure with a systolic in the 097D.  Patient with questionable cardiac ischemia as well.    PT Comments    Pt resting in bed upon PT arrival; agreeable to PT activities.  During session pt mod assist with bed mobility; mod assist with transfers using RW; and min assist to take a couple steps in place using RW (limited d/t pt fatigue and pt requesting to sit back down again).  Will continue to focus on strengthening, balance, and progressive functional mobility during hospitalization.  Plan of care reviewed and updated as appropriate.    Recommendations for follow up therapy are one component of a multi-disciplinary discharge planning process, led by the attending physician.  Recommendations may be updated based on patient status, additional functional criteria and insurance authorization.  Follow Up Recommendations  Skilled nursing-short term rehab (<3 hours/day) Can patient physically be transported by private vehicle: No   Assistance Recommended at Discharge Frequent or constant Supervision/Assistance  Patient can return home with the following Two people to help with walking and/or transfers;Two people to help with bathing/dressing/bathroom;Help with stairs or ramp for entrance;Assist for transportation;Assistance with cooking/housework   Equipment Recommendations  Rolling walker (2  wheels);BSC/3in1;Wheelchair cushion (measurements PT);Wheelchair (measurements PT)    Recommendations for Other Services       Precautions / Restrictions Precautions Precautions: Fall Precaution Comments: vision impaired Restrictions Weight Bearing Restrictions: No RLE Weight Bearing: Weight bearing as tolerated LLE Weight Bearing: Weight bearing as tolerated     Mobility  Bed Mobility Overal bed mobility: Needs Assistance Bed Mobility: Supine to Sit, Sit to Supine     Supine to sit: Mod assist, HOB elevated Sit to supine: Mod assist, HOB elevated   General bed mobility comments: vc's and assist to initiate movement for bed mobility; cueing for sequencing    Transfers Overall transfer level: Needs assistance Equipment used: Rolling walker (2 wheels) Transfers: Sit to/from Stand Sit to Stand: Mod assist           General transfer comment: mod assist to stand up to RW from bed and control descent sitting; cueing for UE placement    Ambulation/Gait Ambulation/Gait assistance: Min assist Gait Distance (Feet):  (pt took a couple steps in place before requesting to sit down again) Assistive device: Rolling walker (2 wheels)   Gait velocity: decreased     General Gait Details: decreased B LE step height; assist to steady   Stairs             Wheelchair Mobility    Modified Rankin (Stroke Patients Only)       Balance Overall balance assessment: Needs assistance Sitting-balance support: Single extremity supported, Feet supported Sitting balance-[name]: Fair Sitting balance - Comments: steady sitting edge of bed   Standing balance support: Bilateral upper extremity supported, During functional activity, Reliant on assistive device for balance Standing balance-[name]: Fair Standing balance comment: steady static standing with B UE support on  RW                            Cognition Arousal/Alertness: Awake/alert Behavior During  Therapy: Flat affect Overall Cognitive Status: Difficult to assess Area of Impairment: Orientation, Attention, Memory, Following commands, Safety/judgement, Awareness, Problem solving                 Orientation Level: Disoriented to, Situation, Time Current Attention Level: Alternating Memory: Decreased short-term memory Following Commands: Follows one step commands with increased time Safety/Judgement: Decreased awareness of safety, Decreased awareness of deficits Awareness: Intellectual Problem Solving: Slow processing, Decreased initiation, Difficulty sequencing, Requires verbal cues, Requires tactile cues          Exercises      General Comments  Nursing cleared pt for participation in physical therapy.  Pt agreeable to PT session.      Pertinent Vitals/Pain Pain Assessment Pain Assessment: No/denies pain Pain Intervention(s): Limited activity within patient's tolerance, Monitored during session, Repositioned Vitals (HR and O2 on 3 L via nasal cannula) stable and WFL throughout treatment session.    Home Living                          Prior Function            PT Goals (current goals can now be found in the care plan section) Acute Rehab PT Goals Patient Stated Goal: none stated PT Goal Formulation: Patient unable to participate in goal setting Time For Goal Achievement: 02/06/22 Potential to Achieve Goals: Fair Progress towards PT goals: Progressing toward goals    Frequency    Min 2X/week      PT Plan Current plan remains appropriate    Co-evaluation              AM-PAC PT "6 Clicks" Mobility   Outcome Measure  Help needed turning from your back to your side while in a flat bed without using bedrails?: A Lot Help needed moving from lying on your back to sitting on the side of a flat bed without using bedrails?: A Lot Help needed moving to and from a bed to a chair (including a wheelchair)?: A Lot Help needed standing up from  a chair using your arms (e.g., wheelchair or bedside chair)?: A Lot Help needed to walk in hospital room?: Total Help needed climbing 3-5 steps with a railing? : Total 6 Click Score: 10    End of Session Equipment Utilized During Treatment: Gait belt Activity Tolerance: Patient limited by fatigue Patient left: in bed;with call bell/phone within reach;with bed alarm set Nurse Communication: Mobility status;Precautions PT Visit Diagnosis: Other abnormalities of gait and mobility (R26.89);Muscle weakness (generalized) (M62.81);Unsteadiness on feet (R26.81)     Time: 8469-6295 PT Time Calculation (min) (ACUTE ONLY): 20 min  Charges:  $Therapeutic Activity: 8-22 mins                    Leitha Bleak, PT 01/23/22, 5:49 PM

## 2022-01-23 NOTE — Progress Notes (Signed)
Pre HD RN assessment 

## 2022-01-23 NOTE — Progress Notes (Signed)
Pre HD information

## 2022-01-23 NOTE — Progress Notes (Signed)
[facility]  ROUNDING NOTE   Subjective:   Todd Brown is a 61 year old male with past medical history including hypertension, anemia, schizophrenia and end-stage renal disease on hemodialysis.  Patient has presented to the ED with altered mental status.  Patient has been admitted for Altered mental status [R41.82] Hypertensive urgency [I16.0] Severe hypertension [I10] Altered mental status, unspecified altered mental status type [R41.82]  Patient is known to our practice and receives outpatient dialysis treatments at [facility] on a MWF schedule.  Patient seen and evaluated during dialysis   HEMODIALYSIS FLOWSHEET:  Blood Flow Rate (mL/min): 400 mL/min Arterial Pressure (mmHg): -190 mmHg Venous Pressure (mmHg): 240 mmHg TMP (mmHg): 3 mmHg Ultrafiltration Rate (mL/min): 943 mL/min Dialysate Flow Rate (mL/min): 300 ml/min Dialysis Fluid Bolus: Normal Saline Bolus Amount (mL): 300 mL  Resting comfortably and tolerating treatment well.   Objective:  Vital signs in last 24 hours:  Temp:  [98 F (36.7 C)-98.3 F (36.8 C)] 98.3 F (36.8 C) (08/25 0916) Pulse Rate:  [62-71] 62 (08/25 1130) Resp:  [9-18] 11 (08/25 1030) BP: (116-141)/(61-77) 141/70 (08/25 1130) SpO2:  [97 %-100 %] 99 % (08/25 1130) Weight:  [91.1 kg] 91.1 kg (08/25 0916)  Weight change:  Filed Weights   01/21/22 0855 01/21/22 1244 01/23/22 0916  Weight: 88.8 kg 87 kg 91.1 kg    Intake/Output: No intake/output data recorded.   Intake/Output this shift:  Total I/O In: 300 [P.O.:300] Out: -   Physical Exam: General: NAD  Head: Normocephalic, atraumatic. Moist oral mucosal membranes  Eyes: Anicteric  Lungs:  Clear to auscultation, normal effort, room air  Heart: Regular rate and rhythm  Abdomen:  Soft, nontender, non distended  Extremities:  trace peripheral edema.  Neurologic: Alert, answer simple questions  Skin: No lesions  Access: Lt AVF     Basic Metabolic  Panel: Recent Labs  Lab 01/18/22 0922 01/19/22 1046  NA 136 135  K 5.0 4.4  CL 95* 96*  CO2 30 29  GLUCOSE 92 107*  BUN 77* 73*  CREATININE 6.06* 5.16*  CALCIUM 9.5 8.8*     Liver Function Tests: No results for input(s): "AST", "ALT", "ALKPHOS", "BILITOT", "PROT", "ALBUMIN" in the last 168 hours.  No results for input(s): "LIPASE", "AMYLASE" in the last 168 hours. No results for input(s): "AMMONIA" in the last 168 hours.  CBC: Recent Labs  Lab 01/20/22 0637  WBC 4.1  HGB 9.0*  HCT 27.2*  MCV 103.8*  PLT 205     Cardiac Enzymes: No results for input(s): "CKTOTAL", "CKMB", "CKMBINDEX", "TROPONINI" in the last 168 hours.  BNP: Invalid input(s): "POCBNP"  CBG: No results for input(s): "GLUCAP" in the last 168 hours.   Microbiology: Results for orders placed or performed during the hospital encounter of 12/15/21  SARS Coronavirus 2 by RT PCR (hospital order, performed in [facility] hospital lab) *cepheid single result test* Anterior Nasal Swab     Status: None   Collection Time: 12/15/21  5:50 PM   Specimen: Anterior Nasal Swab  Result Value Ref Range Status   SARS Coronavirus 2 by RT PCR NEGATIVE NEGATIVE Final    Comment: (NOTE) SARS-CoV-2 target nucleic acids are NOT DETECTED.  The SARS-CoV-2 RNA is generally detectable in upper and lower respiratory specimens during the acute phase of infection. The lowest concentration of SARS-CoV-2 viral copies this assay can detect is 250 copies / mL. A negative result does not preclude SARS-CoV-2 infection and should not be used as the sole basis for  treatment or other patient management decisions.  A negative result may occur with improper specimen collection / handling, submission of specimen other than nasopharyngeal swab, presence of viral mutation(s) within the areas targeted by this assay, and inadequate number of viral copies (<250 copies / mL). A negative result must be combined with clinical observations,  patient history, and epidemiological information.  Fact Sheet for Patients:   https://www.patel.info/  Fact Sheet for Healthcare Providers: https://hall.com/  This test is not yet approved or  cleared by the [place] FDA and has been authorized for detection and/or diagnosis of SARS-CoV-2 by FDA under an Emergency Use Authorization (EUA).  This EUA will remain in effect (meaning this test can be used) for the duration of the COVID-19 declaration under Section 564(b)(1) of the Act, 21 U.S.C. section 360bbb-3(b)(1), unless the authorization is terminated or revoked sooner.  Performed at [facility], [address] [place], [place] [address]   MRSA Next Gen by PCR, Nasal     Status: None   Collection Time: 12/15/21  9:03 PM   Specimen: Nasal Mucosa; Nasal Swab  Result Value Ref Range Status   MRSA by PCR Next Gen NOT DETECTED NOT DETECTED Final    Comment: (NOTE) The GeneXpert MRSA Assay (FDA approved for NASAL specimens only), is one component of a comprehensive MRSA colonization surveillance program. It is not intended to diagnose MRSA infection nor to guide or monitor treatment for MRSA infections. Test performance is not FDA approved in patients less than 57 years old. Performed at [facility], [address] [place], [place] [address]     Coagulation Studies: No results for input(s): "LABPROT", "INR" in the last 72 hours.  Urinalysis: No results for input(s): "COLORURINE", "LABSPEC", "PHURINE", "GLUCOSEU", "HGBUR", "BILIRUBINUR", "KETONESUR", "PROTEINUR", "UROBILINOGEN", "NITRITE", "LEUKOCYTESUR" in the last 72 hours.  Invalid input(s): "APPERANCEUR"    Imaging: No results found.   Medications:    anticoagulant sodium citrate      (feeding supplement) PROSource Plus  30 mL Oral TID BM   amLODipine  10 mg Oral q morning   calcium acetate  667 mg Oral TID WC   carbamazepine  1,000 mg Oral q  AM   Chlorhexidine Gluconate Cloth  6 each Topical Daily   Chlorhexidine Gluconate Cloth  6 each Topical Q0600   cloNIDine  0.2 mg Transdermal Weekly   doxazosin  4 mg Oral QHS   epoetin (EPOGEN/PROCRIT) injection  10,000 Units Intravenous Q M,W,F-HD   ferrous sulfate  325 mg Oral Q breakfast   fluPHENAZine  2.5 mg Oral q AM   heparin  5,000 Units Subcutaneous Q8H   hydrALAZINE  10 mg Oral TID   irbesartan  300 mg Oral Daily   isosorbide mononitrate  60 mg Oral BID   minoxidil  10 mg Oral Daily   mometasone-formoterol  2 puff Inhalation BID   multivitamin  1 tablet Oral QHS   nystatin  5 mL Oral QID   mouth rinse  15 mL Mouth Rinse 4 times per day   pantoprazole  40 mg Oral Daily   patiromer  16.8 g Oral Daily   polyethylene glycol  17 g Oral BID   trihexyphenidyl  2 mg Oral BID WC   albuterol, alteplase, anticoagulant sodium citrate, diphenhydrAMINE, haloperidol lactate, heparin, hydrALAZINE, lidocaine (PF), lidocaine-prilocaine, ondansetron **OR** ondansetron (ZOFRAN) IV, mouth rinse, pentafluoroprop-tetrafluoroeth, polyethylene glycol, senna-docusate  Assessment/ Plan:  [name] is a 62 y.o.  male with past medical history including hypertension,  anemia, schizophrenia and end-stage renal disease on hemodialysis.  Patient has presented to the ED with altered mental status.  Patient has been admitted for Altered mental status [R41.82] Hypertensive urgency [I16.0] Severe hypertension [I10] Altered mental status, unspecified altered mental status type [R41.82]  [facility]/MWF/Lt AVF  End-stage renal disease with hyperkalemia on hemodialysis.  Will maintain outpatient schedule if possible.   Receiving dialysis today, UF 2.5L as tolerated. Next treatment scheduled for Monday. Due to extended hospitalization, patient has been discharged from outpatient dialysis clinic.  Renal navigator aware and will begin outpatient clinic search.     2. Anemia of chronic kidney  disease Lab Results  Component Value Date   HGB 9.0 (L) 01/20/2022    Hgb within desired goal. Continue EPO with dialysis treatments.     3. Secondary Hyperparathyroidism:  Lab Results  Component Value Date   CALCIUM 8.8 (L) 01/19/2022   PHOS 4.2 01/12/2022   Continue calcium acetate with meals.  4.  Hypertension with chronic kidney disease.  Home regimen includes amlodipine, clonidine, hydralazine, isosorbide, and losartan.    Currently on amlodipine 10 mg daily, clonidine patch 0.2 mg per 24 hours, Cardura 4 mg daily at bedtime, hydralazine 10 mg 3 times per day, Avapro 300 mg daily, isosorbide 60 mg twice a day, minoxidil 10 mg daily.  Blood pressure currently 124/71 during dialysis    LOS: [place] 8/25/202312:00 PM

## 2022-01-23 NOTE — Progress Notes (Signed)
[facility] Progress Note  Patient: Todd Brown    OQH:[id number]  DOA: 12/15/2021    Date of Service: the patient was seen and examined on 01/23/2022  Brief hospital course: 61 year old male with past medical history of hypertension and end-stage renal disease on hemodialysis presented to the emergency room from his group home/Assisted living facility on 7/17 with confusion.  (Patient's baseline was that he is unable to care for himself and has a legal guardian.)  Patient last received dialysis on Friday, 7/14.    Patient found to have markedly elevated blood pressure with a systolic in the 546F requiring Cardene drip and placement of patient to the ICU.  Patient with questionable cardiac ischemia as well.  CT scan of head and neurological exam unremarkable.    Patient was admitted to the hospitalist service and started on his oral antihypertensives, nicardipine drip and given IV labetalol and as needed IV hydralazine.  Nephrology consulted and resumed patient's dialysis.  Over the next few weeks, blood pressure better controlled.    At times became agitated and seen by psychiatry and started on scheduled and as needed Haldol, leading to more compliance with better mentation as well as taking his antihypertensives.    Hospital course complicated by episode of periorbital swelling bilaterally felt to be secondary to subcutaneous emphysema, resolved on steroids.  Also had episode of oral thrush treated with Magic mouthwash.    Patient's assisted living facility willing to take patient back after becomes much stronger at skilled nursing facility.  Skilled nursing facility search in progress.  Assessment and Plan: * Acute metabolic encephalopathy-resolved as of 12/31/2021 Felt to be secondary to malignant hypertension.  Resolved and now back at baseline.  Had some increased somnolence which improved after stopping his scheduled Benadryl and Haldol.  Hypertensive emergency-resolved as of  12/31/2021 Resolved.  See below.  ESRD on dialysis [facility]) Nephrology consulted, full dialysis session following admission and again today to keep him on his Monday Wednesday Friday schedule. -Continue scheduled dialysis  Mental health disorder Unspecified.  Patient is unable to care for himself.  It is unclear if he is ever had a formal diagnosis.  He is on Tegretol and Artane which he should continue.  As needed Haldol for acute behavior agitation as above.  He is for the most part much more calm and cooperative these days over times, he intermittently refused vitals being checked or taking his medications.  Essential hypertension Resumed on his home p.o. amlodipine 10 mg daily, clonidine 0.3 mg p.o. 3 times daily, hydralazine 100 mg p.o. 3 times daily, isosorbide mononitrate 60 mg p.o. twice daily, losartan 100 mg daily.  Pressures at time improved, but then blood pressure starts to spike again.  As above, changing ARB and adding alpha-blocker. -Nephrology added clonidine patches with p.o. clonidine. -Minoxidil was then added and titrated up to 10 mg daily.  During the first week of August, patient occasionally had episodes of lower blood pressure without any blood pressure spikes, so some of his newer medications were decreased: hydralazine and minoxidil was scaled back.  With blood pressure on the rise again, have increased minoxidil back to 10 mg and hydralazine up to 10 mg 3 times daily.  Pressures better after dialysis.  Oral thrush 8/23: start nystatin suspension  Subcutaneous emphysema (HCC)-resolved as of 12/18/2021 On steroids.  Appreciate pulmonary assistance.  Subcutaneous emphysema looks to be resolved.  Changed from Solu-Medrol to p.o. prednisone quick taper.  Physical deconditioning PT OT are recommending SNF.  However, this is similar to a previous hospitalization where TOC was unable to find skilled nursing and over time, patient's strength improved that he was able to be  discharged back to his assisted living facility.  Anemia of chronic disease Hbg stable. Check CBC ~weekly.  Hyperkalemia Potassiums have been trending upward for the last 2 days and patient started on Veltassa.  Last K was 4.4 on 8/21.  Malnutrition of moderate degree Nutrition Status: Nutrition Problem: Moderate Malnutrition Etiology: chronic illness (ESRD on HD) Signs/Symptoms: mild fat depletion, mild muscle depletion, moderate muscle depletion Interventions: Refer to RD note for recommendations, Nepro shake, MVI, Prostat       Body mass index is 26.79 kg/m.  Nutrition Problem: Moderate Malnutrition Etiology: chronic illness (ESRD on HD)     Consultants: Nephrology Critical care Psychiatry  Procedures: Hemodialysis   Antimicrobials: None  Code Status: Full code   Subjective: Pt doing well. No acute complaints. No acute events reported.  For dialysis today.  Objective: Vitals:   01/23/22 1252 01/23/22 1254  BP: (!) 147/69 136/62  Pulse:    Resp:    Temp: 97.9 F (36.6 C)   SpO2:     Filed Weights   01/21/22 1244 01/23/22 0916 01/23/22 1254  Weight: 87 kg 91.1 kg 89.6 kg   Body mass index is 26.79 kg/m.  Exam: General exam: awake and alert, no acute distress Respiratory system: on room air, normal respiratory effort. Cardiovascular system: RRR, lower extremity edema Gastrointestinal system: soft, non-tender, mildly distended Central nervous system: exam limited by pt not following commands Extremities: no edema, normal tone Psychiatry: normal mood and affect   Data Reviewed: No new labs today   Disposition:  Status is: Inpatient Remains inpatient appropriate because:  --Awaiting SNF placement  Anticipated discharge date: Unknown  Family Communication: None   DVT Prophylaxis: heparin injection 5,000 Units Start: 12/15/21 1645 Place TED hose Start: 12/15/21 1629    Author: Ezekiel Slocumb ,DO 01/23/2022 1:11 PM  To reach  On-call, see care teams to locate the attending and reach out via [email or web address]. Between 7PM-7AM, please contact night-coverage If you still have difficulty reaching the attending provider, please page the [facility] (Director on Call) for [facility] on amion for assistance.

## 2022-01-23 NOTE — Progress Notes (Signed)
Pt HD complete and goal met. Alert, no c/o, vss. Report to primary RN. Start: 3254 End: 9826 3011m fluid removed 84L BVP 89.6kg post bed weight Epogen 10000u w/ HD

## 2022-01-24 DIAGNOSIS — G9341 Metabolic encephalopathy: Secondary | ICD-10-CM | POA: Diagnosis not present

## 2022-01-24 MED ORDER — HYDRALAZINE HCL 50 MG PO TABS
25.0000 mg | ORAL_TABLET | Freq: Three times a day (TID) | ORAL | Status: DC
  Administered 2022-01-25 – 2022-02-22 (×66): 25 mg via ORAL
  Filled 2022-01-24 (×72): qty 1

## 2022-01-24 NOTE — Progress Notes (Signed)
[facility] Progress Note  Patient: Todd Brown    IWL:[id number]  DOA: 12/15/2021    Date of Service: the patient was seen and examined on 01/24/2022  Brief hospital course: 61 year old male with past medical history of hypertension and end-stage renal disease on hemodialysis presented to the emergency room from his group home/Assisted living facility on 7/17 with confusion.  (Patient's baseline was that he is unable to care for himself and has a legal guardian.)  Patient last received dialysis on Friday, 7/14.    Patient found to have markedly elevated blood pressure with a systolic in the 174Y requiring Cardene drip and placement of patient to the ICU.  Patient with questionable cardiac ischemia as well.  CT scan of head and neurological exam unremarkable.    Patient was admitted to the hospitalist service and started on his oral antihypertensives, nicardipine drip and given IV labetalol and as needed IV hydralazine.  Nephrology consulted and resumed patient's dialysis.  Over the next few weeks, blood pressure better controlled.    At times became agitated and seen by psychiatry and started on scheduled and as needed Haldol, leading to more compliance with better mentation as well as taking his antihypertensives.    Hospital course complicated by episode of periorbital swelling bilaterally felt to be secondary to subcutaneous emphysema, resolved on steroids.  Also had episode of oral thrush treated with Magic mouthwash.    Patient's assisted living facility willing to take patient back after becomes much stronger at skilled nursing facility.  Skilled nursing facility search in progress.  Assessment and Plan: * Acute metabolic encephalopathy-resolved as of 12/31/2021 Felt to be secondary to malignant hypertension.  Resolved and now back at baseline.  Had some increased somnolence which improved after stopping his scheduled Benadryl and Haldol.  Hypertensive emergency-resolved as of  12/31/2021 Resolved.  See below.  ESRD on dialysis [facility]) Nephrology consulted, full dialysis session following admission and again today to keep him on his Monday Wednesday Friday schedule. -Continue scheduled dialysis  Mental health disorder Unspecified.  Patient is unable to care for himself.  It is unclear if he is ever had a formal diagnosis.  He is on Tegretol and Artane which he should continue.  As needed Haldol for acute behavior agitation as above.  He is for the most part much more calm and cooperative these days over times, he intermittently refused vitals being checked or taking his medications.  Essential hypertension Resumed on his home p.o. amlodipine 10 mg daily, clonidine 0.3 mg p.o. 3 times daily, hydralazine 100 mg p.o. 3 times daily, isosorbide mononitrate 60 mg p.o. twice daily, losartan 100 mg daily.  Pressures at time improved, but then blood pressure starts to spike again.  As above, changing ARB and adding alpha-blocker. -Nephrology added clonidine patches with p.o. clonidine. -Minoxidil was then added and titrated up to 10 mg daily.  During the first week of August, patient occasionally had episodes of lower blood pressure without any blood pressure spikes, so some of his newer medications were decreased: hydralazine and minoxidil was scaled back.  With blood pressure on the rise again, have increased minoxidil back to 10 mg and hydralazine up to 10 mg 3 times daily.  Pressures better after dialysis.  Oral thrush 8/23: start nystatin suspension  Subcutaneous emphysema (HCC)-resolved as of 12/18/2021 On steroids.  Appreciate pulmonary assistance.  Subcutaneous emphysema looks to be resolved.  Changed from Solu-Medrol to p.o. prednisone quick taper.  Physical deconditioning PT OT are recommending SNF.  However, this is similar to a previous hospitalization where TOC was unable to find skilled nursing and over time, patient's strength improved that he was able to be  discharged back to his assisted living facility.  Anemia of chronic disease Hbg stable. Check CBC ~weekly.  Hyperkalemia Potassiums have been trending upward for the last 2 days and patient started on Veltassa.  Last K was 4.4 on 8/21.  Malnutrition of moderate degree Nutrition Status: Nutrition Problem: Moderate Malnutrition Etiology: chronic illness (ESRD on HD) Signs/Symptoms: mild fat depletion, mild muscle depletion, moderate muscle depletion Interventions: Refer to RD note for recommendations, Nepro shake, MVI, Prostat       Body mass index is 26.79 kg/m.  Nutrition Problem: Moderate Malnutrition Etiology: chronic illness (ESRD on HD)     Consultants: Nephrology Critical care Psychiatry  Procedures: Hemodialysis   Antimicrobials: None  Code Status: Full code   Subjective: Pt doing well. Sitting up in bed eating his lunch.  Denies any complaints. No acute events reproted  Objective: Vitals:   01/24/22 0356 01/24/22 0749  BP: (!) 148/81 (!) 160/75  Pulse: 75 62  Resp: 18 19  Temp: 97.9 F (36.6 C) (!) 97.5 F (36.4 C)  SpO2: 97% 95%   Filed Weights   01/21/22 1244 01/23/22 0916 01/23/22 1254  Weight: 87 kg 91.1 kg 89.6 kg   Body mass index is 26.79 kg/m.  Exam: General exam: awake and alert, no acute distress Respiratory system: on room air, normal respiratory effort. Cardiovascular system: RRR, lower extremity edema appears stable Gastrointestinal system: soft, non-tender, mildly distended Central nervous system: exam limited by pt not following commands Extremities: no edema, normal tone Psychiatry: normal mood and affect   Data Reviewed: No new labs today   Disposition:  Status is: Inpatient Remains inpatient appropriate because:  --Awaiting SNF placement  Anticipated discharge date: Unknown  Family Communication: None   DVT Prophylaxis: heparin injection 5,000 Units Start: 12/15/21 1645 Place TED hose Start: 12/15/21  1629    Author: Ezekiel Slocumb ,DO 01/24/2022 2:34 PM  To reach On-call, see care teams to locate the attending and reach out via [email or web address]. Between 7PM-7AM, please contact night-coverage If you still have difficulty reaching the attending provider, please page the [facility] (Director on Call) for [facility] on amion for assistance.

## 2022-01-24 NOTE — Progress Notes (Signed)
[facility]  ROUNDING NOTE   Subjective:   Todd Brown is a 61 year old male with past medical history including hypertension, anemia, schizophrenia and end-stage renal disease on hemodialysis.  Patient has presented to the ED with altered mental status.  Patient has been admitted for Altered mental status [R41.82] Hypertensive urgency [I16.0] Severe hypertension [I10] Altered mental status, unspecified altered mental status type [R41.82]  Patient is known to our practice and receives outpatient dialysis treatments at [facility] on a MWF schedule.  Patient is resting comfortably in the bed. Patient offers no new specific physical complaints    Objective:  Vital signs in last 24 hours:  Temp:  [97.5 F (36.4 C)-98.7 F (37.1 C)] 97.5 F (36.4 C) (08/26 0749) Pulse Rate:  [60-75] 62 (08/26 0749) Resp:  [16-19] 19 (08/26 0749) BP: (109-160)/(58-81) 160/75 (08/26 0749) SpO2:  [90 %-100 %] 95 % (08/26 0749) Weight:  [89.6 kg] 89.6 kg (08/25 1254)  Weight change:  Filed Weights   01/21/22 1244 01/23/22 0916 01/23/22 1254  Weight: 87 kg 91.1 kg 89.6 kg    Intake/Output: I/O last 3 completed shifts: In: 300 [P.O.:300] Out: 3000 [Other:3000]   Intake/Output this shift:  No intake/output data recorded.  Physical Exam: General: NAD  Head: Normocephalic, atraumatic. Moist oral mucosal membranes  Eyes: Anicteric  Lungs:  Clear to auscultation, normal effort, room air  Heart: Regular rate and rhythm  Abdomen:  Soft, nontender, non distended  Extremities:  trace peripheral edema.  Neurologic: Alert, answer simple questions  Skin: No lesions  Access: Lt AVF     Basic Metabolic Panel: Recent Labs  Lab 01/18/22 0922 01/19/22 1046  NA 136 135  K 5.0 4.4  CL 95* 96*  CO2 30 29  GLUCOSE 92 107*  BUN 77* 73*  CREATININE 6.06* 5.16*  CALCIUM 9.5 8.8*    Liver Function Tests: No results for input(s): "AST", "ALT", "ALKPHOS", "BILITOT", "PROT",  "ALBUMIN" in the last 168 hours.  No results for input(s): "LIPASE", "AMYLASE" in the last 168 hours. No results for input(s): "AMMONIA" in the last 168 hours.  CBC: Recent Labs  Lab 01/20/22 0637  WBC 4.1  HGB 9.0*  HCT 27.2*  MCV 103.8*  PLT 205    Cardiac Enzymes: No results for input(s): "CKTOTAL", "CKMB", "CKMBINDEX", "TROPONINI" in the last 168 hours.  BNP: Invalid input(s): "POCBNP"  CBG: No results for input(s): "GLUCAP" in the last 168 hours.   Microbiology: Results for orders placed or performed during the hospital encounter of 12/15/21  SARS Coronavirus 2 by RT PCR (hospital order, performed in [facility] hospital lab) *cepheid single result test* Anterior Nasal Swab     Status: None   Collection Time: 12/15/21  5:50 PM   Specimen: Anterior Nasal Swab  Result Value Ref Range Status   SARS Coronavirus 2 by RT PCR NEGATIVE NEGATIVE Final    Comment: (NOTE) SARS-CoV-2 target nucleic acids are NOT DETECTED.  The SARS-CoV-2 RNA is generally detectable in upper and lower respiratory specimens during the acute phase of infection. The lowest concentration of SARS-CoV-2 viral copies this assay can detect is 250 copies / mL. A negative result does not preclude SARS-CoV-2 infection and should not be used as the sole basis for treatment or other patient management decisions.  A negative result may occur with improper specimen collection / handling, submission of specimen other than nasopharyngeal swab, presence of viral mutation(s) within the areas targeted by this assay, and inadequate number of viral copies (<250  copies / mL). A negative result must be combined with clinical observations, patient history, and epidemiological information.  Fact Sheet for Patients:   https://www.patel.info/  Fact Sheet for Healthcare Providers: https://hall.com/  This test is not yet approved or  cleared by the [place] FDA and has  been authorized for detection and/or diagnosis of SARS-CoV-2 by FDA under an Emergency Use Authorization (EUA).  This EUA will remain in effect (meaning this test can be used) for the duration of the COVID-19 declaration under Section 564(b)(1) of the Act, 21 U.S.C. section 360bbb-3(b)(1), unless the authorization is terminated or revoked sooner.  Performed at [facility], [address] [place], [place] [address]   MRSA Next Gen by PCR, Nasal     Status: None   Collection Time: 12/15/21  9:03 PM   Specimen: Nasal Mucosa; Nasal Swab  Result Value Ref Range Status   MRSA by PCR Next Gen NOT DETECTED NOT DETECTED Final    Comment: (NOTE) The GeneXpert MRSA Assay (FDA approved for NASAL specimens only), is one component of a comprehensive MRSA colonization surveillance program. It is not intended to diagnose MRSA infection nor to guide or monitor treatment for MRSA infections. Test performance is not FDA approved in patients less than 24 years old. Performed at [facility], [address] [place], [place] [address]     Coagulation Studies: No results for input(s): "LABPROT", "INR" in the last 72 hours.  Urinalysis: No results for input(s): "COLORURINE", "LABSPEC", "PHURINE", "GLUCOSEU", "HGBUR", "BILIRUBINUR", "KETONESUR", "PROTEINUR", "UROBILINOGEN", "NITRITE", "LEUKOCYTESUR" in the last 72 hours.  Invalid input(s): "APPERANCEUR"    Imaging: No results found.   Medications:    anticoagulant sodium citrate      (feeding supplement) PROSource Plus  30 mL Oral TID BM   amLODipine  10 mg Oral q morning   calcium acetate  667 mg Oral TID WC   carbamazepine  1,000 mg Oral q AM   Chlorhexidine Gluconate Cloth  6 each Topical Daily   Chlorhexidine Gluconate Cloth  6 each Topical Q0600   cloNIDine  0.2 mg Transdermal Weekly   doxazosin  4 mg Oral QHS   epoetin (EPOGEN/PROCRIT) injection  10,000 Units Intravenous Q M,W,F-HD   ferrous sulfate  325 mg  Oral Q breakfast   fluPHENAZine  2.5 mg Oral q AM   heparin  5,000 Units Subcutaneous Q8H   hydrALAZINE  10 mg Oral TID   irbesartan  300 mg Oral Daily   isosorbide mononitrate  60 mg Oral BID   minoxidil  10 mg Oral Daily   mometasone-formoterol  2 puff Inhalation BID   multivitamin  1 tablet Oral QHS   nystatin  5 mL Oral QID   mouth rinse  15 mL Mouth Rinse 4 times per day   pantoprazole  40 mg Oral Daily   patiromer  16.8 g Oral Daily   polyethylene glycol  17 g Oral BID   trihexyphenidyl  2 mg Oral BID WC   albuterol, alteplase, anticoagulant sodium citrate, diphenhydrAMINE, haloperidol lactate, heparin, hydrALAZINE, lidocaine (PF), lidocaine-prilocaine, ondansetron **OR** ondansetron (ZOFRAN) IV, mouth rinse, pentafluoroprop-tetrafluoroeth, polyethylene glycol, senna-docusate  Assessment/ Plan:  [name] is a 61 y.o.  male with past medical history including hypertension, anemia, schizophrenia and end-stage renal disease on hemodialysis.  Patient has presented to the ED with altered mental status.  Patient has been admitted for Altered mental status [R41.82] Hypertensive urgency [I16.0] Severe hypertension [I10] Altered mental status, unspecified altered mental status type [  R41.82]  [facility]/MWF/Lt AVF  End-stage renal disease with hyperkalemia on hemodialysis.  Will maintain outpatient schedule if possible.   Patient was last dialyzed yesterday No need for renal placement therapy today Patient is awaiting outpatient chair placement. Patient was discharged from his regular outpatient dialysis clinic because of extended inpatient stay Renal navigator/social worker/team is helping with the placement     2. Anemia of chronic kidney disease Lab Results  Component Value Date   HGB 9.0 (L) 01/20/2022    Hgb Just at goal. Continue EPO with dialysis treatments.     3. Secondary Hyperparathyroidism:  Lab Results  Component Value Date   CALCIUM 8.8 (L)  01/19/2022   PHOS 4.2 01/12/2022   Continue calcium acetate with meals.  4.  Hypertension with chronic kidney disease.  Home regimen includes amlodipine, clonidine, hydralazine, isosorbide, and losartan.    Patient blood pressure is high Patient is currently on amlodipine 10 mg p.o. daily Hydralazine 10 mg p.o. 3 times daily Irbesartan 300 mg p.o. daily Isosorbide 60 mg p.o. twice daily Doxazosin 4 mg p.o. daily Minoxidil 10 mg p.o. daily  We will increase patient's hydralazine to 25 mg p.o. 3 times daily    LOS: 40 Todd Brown s Todd Brown 8/26/202311:58 AM

## 2022-01-24 NOTE — Plan of Care (Signed)
Pt has had some soft BP today.  At 16:30 it was 99/63. 25 mg dose of hydralazine was held at 17:30.  At 21:17, BP was 112/62 and has both 25 mg hydralazine ordered and 4 mg doxazosin.  Reached out to oncall MD and he recommended holding both.  BP will continue to be monitored.

## 2022-01-25 DIAGNOSIS — G9341 Metabolic encephalopathy: Secondary | ICD-10-CM | POA: Diagnosis not present

## 2022-01-25 NOTE — Progress Notes (Signed)
[facility] Progress Note  Patient: Todd Brown    IWL:[id number]  DOA: 12/15/2021    Date of Service: the patient was seen and examined on 01/25/2022  Brief hospital course: 61 year old male with past medical history of hypertension and end-stage renal disease on hemodialysis presented to the emergency room from his group home/Assisted living facility on 7/17 with confusion.  (Patient's baseline was that he is unable to care for himself and has a legal guardian.)  Patient last received dialysis on Friday, 7/14.    Patient found to have markedly elevated blood pressure with a systolic in the 174Y requiring Cardene drip and placement of patient to the ICU.  Patient with questionable cardiac ischemia as well.  CT scan of head and neurological exam unremarkable.    Patient was admitted to the hospitalist service and started on his oral antihypertensives, nicardipine drip and given IV labetalol and as needed IV hydralazine.  Nephrology consulted and resumed patient's dialysis.  Over the next few weeks, blood pressure better controlled.    At times became agitated and seen by psychiatry and started on scheduled and as needed Haldol, leading to more compliance with better mentation as well as taking his antihypertensives.    Hospital course complicated by episode of periorbital swelling bilaterally felt to be secondary to subcutaneous emphysema, resolved on steroids.  Also had episode of oral thrush treated with Magic mouthwash.    Patient's assisted living facility willing to take patient back after becomes much stronger at skilled nursing facility.  Skilled nursing facility search in progress.  Assessment and Plan: * Acute metabolic encephalopathy-resolved as of 12/31/2021 Felt to be secondary to malignant hypertension.  Resolved and now back at baseline.  Had some increased somnolence which improved after stopping his scheduled Benadryl and Haldol.  Hypertensive emergency-resolved as of  12/31/2021 Resolved.  See below.  ESRD on dialysis [facility]) Nephrology consulted, full dialysis session following admission and again today to keep him on his Monday Wednesday Friday schedule. -Continue scheduled dialysis  Mental health disorder Unspecified.  Patient is unable to care for himself.  It is unclear if he is ever had a formal diagnosis.  He is on Tegretol and Artane which he should continue.  As needed Haldol for acute behavior agitation as above.  He is for the most part much more calm and cooperative these days over times, he intermittently refused vitals being checked or taking his medications.  Essential hypertension Resumed on his home p.o. amlodipine 10 mg daily, clonidine 0.3 mg p.o. 3 times daily, hydralazine 100 mg p.o. 3 times daily, isosorbide mononitrate 60 mg p.o. twice daily, losartan 100 mg daily.  Pressures at time improved, but then blood pressure starts to spike again.  As above, changing ARB and adding alpha-blocker. -Nephrology added clonidine patches with p.o. clonidine. -Minoxidil was then added and titrated up to 10 mg daily.  During the first week of August, patient occasionally had episodes of lower blood pressure without any blood pressure spikes, so some of his newer medications were decreased: hydralazine and minoxidil was scaled back.  With blood pressure on the rise again, have increased minoxidil back to 10 mg and hydralazine up to 10 mg 3 times daily.  Pressures better after dialysis.  Oral thrush 8/23: start nystatin suspension  Subcutaneous emphysema (HCC)-resolved as of 12/18/2021 On steroids.  Appreciate pulmonary assistance.  Subcutaneous emphysema looks to be resolved.  Changed from Solu-Medrol to p.o. prednisone quick taper.  Physical deconditioning PT OT are recommending SNF.  However, this is similar to a previous hospitalization where TOC was unable to find skilled nursing and over time, patient's strength improved that he was able to be  discharged back to his assisted living facility.  Anemia of chronic disease Hbg stable. Check CBC ~weekly.  Hyperkalemia Potassiums have been trending upward for the last 2 days and patient started on Veltassa.  Last K was 4.4 on 8/21.  Malnutrition of moderate degree Nutrition Status: Nutrition Problem: Moderate Malnutrition Etiology: chronic illness (ESRD on HD) Signs/Symptoms: mild fat depletion, mild muscle depletion, moderate muscle depletion Interventions: Refer to RD note for recommendations, Nepro shake, MVI, Prostat       Body mass index is 26.79 kg/m.  Nutrition Problem: Moderate Malnutrition Etiology: chronic illness (ESRD on HD)     Consultants: Nephrology Critical care Psychiatry  Procedures: Hemodialysis   Antimicrobials: None  Code Status: Full code   Subjective: Pt sleeping soundly, appears comfortable. No acute events reported.  Objective: Vitals:   01/25/22 0549 01/25/22 0756  BP: 126/74 (!) 143/75  Pulse: 70 64  Resp: 18 16  Temp: 98.7 F (37.1 C) 98 F (36.7 C)  SpO2: 95% 99%   Filed Weights   01/21/22 1244 01/23/22 0916 01/23/22 1254  Weight: 87 kg 91.1 kg 89.6 kg   Body mass index is 26.79 kg/m.  Exam: General exam: sleeping comfortable, no acute distress Respiratory system: on room air, normal respiratory effort. Cardiovascular system: RRR, lower extremity edema appears stable Gastrointestinal system: soft, non-tender Central nervous system: exam limited by somnolence Extremities: no edema, normal tone Psychiatry: exam limited by somnolence   Data Reviewed: No new labs today   Disposition:  Status is: Inpatient Remains inpatient appropriate because:  --Awaiting SNF placement  Anticipated discharge date: Unknown  Family Communication: None   DVT Prophylaxis: heparin injection 5,000 Units Start: 12/15/21 1645 Place TED hose Start: 12/15/21 1629    Author: Ezekiel Slocumb ,DO 01/25/2022 4:51 PM  To  reach On-call, see care teams to locate the attending and reach out via [email or web address]. Between 7PM-7AM, please contact night-coverage If you still have difficulty reaching the attending provider, please page the [facility] (Director on Call) for [facility] on amion for assistance.

## 2022-01-25 NOTE — Progress Notes (Signed)
[facility]  ROUNDING NOTE   Subjective:   Todd Brown is a 61 year old male with past medical history including hypertension, anemia, schizophrenia and end-stage renal disease on hemodialysis.  Patient has presented to the ED with altered mental status.  Patient has been admitted for Altered mental status [R41.82] Hypertensive urgency [I16.0] Severe hypertension [I10] Altered mental status, unspecified altered mental status type [R41.82]  Patient is known to our practice and receives outpatient dialysis treatments at [facility] on a MWF schedule.  Patient is resting comfortably in the bed. Patient offers no new specific physical complaints    Objective:  Vital signs in last 24 hours:  Temp:  [97.3 F (36.3 C)-99.4 F (37.4 C)] 98 F (36.7 C) (08/27 0756) Pulse Rate:  [64-87] 64 (08/27 0756) Resp:  [14-18] 16 (08/27 0756) BP: (99-143)/(62-75) 143/75 (08/27 0756) SpO2:  [95 %-100 %] 99 % (08/27 0756)  Weight change:  Filed Weights   01/21/22 1244 01/23/22 0916 01/23/22 1254  Weight: 87 kg 91.1 kg 89.6 kg    Intake/Output: I/O last 3 completed shifts: In: 240 [P.O.:240] Out: -    Intake/Output this shift:  No intake/output data recorded.  Physical Exam: General: NAD  Head: Normocephalic, atraumatic. Moist oral mucosal membranes  Eyes: Anicteric  Lungs:  Clear to auscultation, normal effort, room air  Heart: Regular rate and rhythm  Abdomen:  Soft, nontender, non distended  Extremities:  trace peripheral edema.  Neurologic: Alert, answer simple questions  Skin: No lesions  Access: Lt AVF     Basic Metabolic Panel: Recent Labs  Lab 01/18/22 0922 01/19/22 1046  NA 136 135  K 5.0 4.4  CL 95* 96*  CO2 30 29  GLUCOSE 92 107*  BUN 77* 73*  CREATININE 6.06* 5.16*  CALCIUM 9.5 8.8*    Liver Function Tests: No results for input(s): "AST", "ALT", "ALKPHOS", "BILITOT", "PROT", "ALBUMIN" in the last 168 hours.  No results for input(s):  "LIPASE", "AMYLASE" in the last 168 hours. No results for input(s): "AMMONIA" in the last 168 hours.  CBC: Recent Labs  Lab 01/20/22 0637  WBC 4.1  HGB 9.0*  HCT 27.2*  MCV 103.8*  PLT 205    Cardiac Enzymes: No results for input(s): "CKTOTAL", "CKMB", "CKMBINDEX", "TROPONINI" in the last 168 hours.  BNP: Invalid input(s): "POCBNP"  CBG: No results for input(s): "GLUCAP" in the last 168 hours.   Microbiology: Results for orders placed or performed during the hospital encounter of 12/15/21  SARS Coronavirus 2 by RT PCR (hospital order, performed in [facility] hospital lab) *cepheid single result test* Anterior Nasal Swab     Status: None   Collection Time: 12/15/21  5:50 PM   Specimen: Anterior Nasal Swab  Result Value Ref Range Status   SARS Coronavirus 2 by RT PCR NEGATIVE NEGATIVE Final    Comment: (NOTE) SARS-CoV-2 target nucleic acids are NOT DETECTED.  The SARS-CoV-2 RNA is generally detectable in upper and lower respiratory specimens during the acute phase of infection. The lowest concentration of SARS-CoV-2 viral copies this assay can detect is 250 copies / mL. A negative result does not preclude SARS-CoV-2 infection and should not be used as the sole basis for treatment or other patient management decisions.  A negative result may occur with improper specimen collection / handling, submission of specimen other than nasopharyngeal swab, presence of viral mutation(s) within the areas targeted by this assay, and inadequate number of viral copies (<250 copies / mL). A negative result must be  combined with clinical observations, patient history, and epidemiological information.  Fact Sheet for Patients:   https://www.patel.info/  Fact Sheet for Healthcare Providers: https://hall.com/  This test is not yet approved or  cleared by the [place] FDA and has been authorized for detection and/or diagnosis of SARS-CoV-2  by FDA under an Emergency Use Authorization (EUA).  This EUA will remain in effect (meaning this test can be used) for the duration of the COVID-19 declaration under Section 564(b)(1) of the Act, 21 U.S.C. section 360bbb-3(b)(1), unless the authorization is terminated or revoked sooner.  Performed at [facility], [address] [place], [place] [address]   MRSA Next Gen by PCR, Nasal     Status: None   Collection Time: 12/15/21  9:03 PM   Specimen: Nasal Mucosa; Nasal Swab  Result Value Ref Range Status   MRSA by PCR Next Gen NOT DETECTED NOT DETECTED Final    Comment: (NOTE) The GeneXpert MRSA Assay (FDA approved for NASAL specimens only), is one component of a comprehensive MRSA colonization surveillance program. It is not intended to diagnose MRSA infection nor to guide or monitor treatment for MRSA infections. Test performance is not FDA approved in patients less than 53 years old. Performed at [facility], [address] [place], [place] [address]     Coagulation Studies: No results for input(s): "LABPROT", "INR" in the last 72 hours.  Urinalysis: No results for input(s): "COLORURINE", "LABSPEC", "PHURINE", "GLUCOSEU", "HGBUR", "BILIRUBINUR", "KETONESUR", "PROTEINUR", "UROBILINOGEN", "NITRITE", "LEUKOCYTESUR" in the last 72 hours.  Invalid input(s): "APPERANCEUR"    Imaging: No results found.   Medications:    anticoagulant sodium citrate      (feeding supplement) PROSource Plus  30 mL Oral TID BM   amLODipine  10 mg Oral q morning   calcium acetate  667 mg Oral TID WC   carbamazepine  1,000 mg Oral q AM   Chlorhexidine Gluconate Cloth  6 each Topical Daily   cloNIDine  0.2 mg Transdermal Weekly   doxazosin  4 mg Oral QHS   epoetin (EPOGEN/PROCRIT) injection  10,000 Units Intravenous Q M,W,F-HD   ferrous sulfate  325 mg Oral Q breakfast   fluPHENAZine  2.5 mg Oral q AM   heparin  5,000 Units Subcutaneous Q8H   hydrALAZINE  25 mg Oral  TID   irbesartan  300 mg Oral Daily   isosorbide mononitrate  60 mg Oral BID   minoxidil  10 mg Oral Daily   mometasone-formoterol  2 puff Inhalation BID   multivitamin  1 tablet Oral QHS   nystatin  5 mL Oral QID   mouth rinse  15 mL Mouth Rinse 4 times per day   pantoprazole  40 mg Oral Daily   patiromer  16.8 g Oral Daily   polyethylene glycol  17 g Oral BID   trihexyphenidyl  2 mg Oral BID WC   albuterol, alteplase, anticoagulant sodium citrate, diphenhydrAMINE, haloperidol lactate, heparin, hydrALAZINE, lidocaine (PF), lidocaine-prilocaine, ondansetron **OR** ondansetron (ZOFRAN) IV, mouth rinse, pentafluoroprop-tetrafluoroeth, polyethylene glycol, senna-docusate  Assessment/ Plan:  Mr. Garfield Coiner is a 61 y.o.  male with past medical history including hypertension, anemia, schizophrenia and end-stage renal disease on hemodialysis.  Patient has presented to the ED with altered mental status.  Patient has been admitted for Altered mental status [R41.82] Hypertensive urgency [I16.0] Severe hypertension [I10] Altered mental status, unspecified altered mental status type [R41.82]  [facility] [place]/MWF/Lt AVF  End-stage renal disease with hyperkalemia on hemodialysis.  Will maintain outpatient  schedule if possible.   Patient was last dialyzed -Friday No need for renal placement therapy today. We will dialyze patient tomorrow, Monday. Patient is awaiting outpatient chair placement. Patient was discharged from his regular outpatient dialysis clinic because of extended inpatient stay Renal navigator/social worker/team is helping with the placement     2. Anemia of chronic kidney disease Lab Results  Component Value Date   HGB 9.0 (L) 01/20/2022    Hgb Just at goal. Continue EPO with dialysis treatments.     3. Secondary Hyperparathyroidism:  Lab Results  Component Value Date   CALCIUM 8.8 (L) 01/19/2022   PHOS 4.2 01/12/2022   Continue calcium acetate with  meals.  4.  Hypertension with chronic kidney disease.  Home regimen includes amlodipine, clonidine, hydralazine, isosorbide, and losartan.    Patient blood pressure is high Patient is currently on amlodipine 10 mg p.o. daily Hydralazine 25 mg p.o. 3 times daily Irbesartan 300 mg p.o. daily Isosorbide 60 mg p.o. twice daily Doxazosin 4 mg p.o. daily Minoxidil 10 mg p.o. daily  Patient blood pressure was low last night but now is at goal we will continue patient on current antihypertensive in case of blood pressure drops will reduce patient hydralazine   LOS: 41 Sandrea Boer s Nazire Fruth 8/27/20238:47 AM

## 2022-01-26 DIAGNOSIS — G9341 Metabolic encephalopathy: Secondary | ICD-10-CM | POA: Diagnosis not present

## 2022-01-26 LAB — RENAL FUNCTION PANEL
Albumin: 2.7 g/dL — ABNORMAL LOW (ref 3.5–5.0)
Anion gap: 11 (ref 5–15)
BUN: 105 mg/dL — ABNORMAL HIGH (ref 8–23)
CO2: 27 mmol/L (ref 22–32)
Calcium: 8.9 mg/dL (ref 8.9–10.3)
Chloride: 95 mmol/L — ABNORMAL LOW (ref 98–111)
Creatinine, Ser: 7.19 mg/dL — ABNORMAL HIGH (ref 0.61–1.24)
GFR, Estimated: 8 mL/min — ABNORMAL LOW (ref >60)
Glucose, Bld: 80 mg/dL (ref 70–99)
Phosphorus: 5 mg/dL — ABNORMAL HIGH (ref 2.5–4.6)
Potassium: 5.3 mmol/L — ABNORMAL HIGH (ref 3.5–5.1)
Sodium: 133 mmol/L — ABNORMAL LOW (ref 135–145)

## 2022-01-26 LAB — CBC WITH DIFFERENTIAL/PLATELET
Abs Immature Granulocytes: 0.01 10*3/uL (ref 0.00–0.07)
Basophils Absolute: 0 10*3/uL (ref 0.0–0.1)
Basophils Relative: 1 %
Eosinophils Absolute: 0.1 10*3/uL (ref 0.0–0.5)
Eosinophils Relative: 3 %
HCT: 28.1 % — ABNORMAL LOW (ref 39.0–52.0)
Hemoglobin: 9.2 g/dL — ABNORMAL LOW (ref 13.0–17.0)
Immature Granulocytes: 0 %
Lymphocytes Relative: 18 %
Lymphs Abs: 0.9 10*3/uL (ref 0.7–4.0)
MCH: 33.9 pg (ref 26.0–34.0)
MCHC: 32.7 g/dL (ref 30.0–36.0)
MCV: 103.7 fL — ABNORMAL HIGH (ref 80.0–100.0)
Monocytes Absolute: 0.6 10*3/uL (ref 0.1–1.0)
Monocytes Relative: 13 %
Neutro Abs: 3 10*3/uL (ref 1.7–7.7)
Neutrophils Relative %: 65 %
Platelets: 196 10*3/uL (ref 150–400)
RBC: 2.71 MIL/uL — ABNORMAL LOW (ref 4.22–5.81)
RDW: 16.4 % — ABNORMAL HIGH (ref 11.5–15.5)
WBC: 4.6 10*3/uL (ref 4.0–10.5)
nRBC: 0 % (ref 0.0–0.2)

## 2022-01-26 MED ORDER — EPOETIN ALFA 10000 UNIT/ML IJ SOLN
INTRAMUSCULAR | Status: AC
  Filled 2022-01-26: qty 1

## 2022-01-26 NOTE — Progress Notes (Signed)
[facility]  ROUNDING NOTE   Subjective:   Todd Brown is a 61 year old male with past medical history including hypertension, anemia, schizophrenia and end-stage renal disease on hemodialysis.  Patient has presented to the ED with altered mental status.  Patient has been admitted for Altered mental status [R41.82] Hypertensive urgency [I16.0] Severe hypertension [I10] Altered mental status, unspecified altered mental status type [R41.82]  Patient is known to our practice and receives outpatient dialysis treatments at [facility] on a MWF schedule.  Patient seen and evaluated during dialysis   HEMODIALYSIS FLOWSHEET:  Blood Flow Rate (mL/min): 300 mL/min Arterial Pressure (mmHg): -210 mmHg Venous Pressure (mmHg): 220 mmHg TMP (mmHg): -2 mmHg Ultrafiltration Rate (mL/min): 828 mL/min Dialysate Flow Rate (mL/min): 300 ml/min Dialysis Fluid Bolus: Normal Saline Bolus Amount (mL): 300 mL  No complaints at this time, resting comfortably and quietly    Objective:  Vital signs in last 24 hours:  Temp:  [98.2 F (36.8 C)-98.8 F (37.1 C)] 98.3 F (36.8 C) (08/28 0830) Pulse Rate:  [58-86] 58 (08/28 1213) Resp:  [9-18] 10 (08/28 1213) BP: (92-140)/(53-74) 115/63 (08/28 1213) SpO2:  [94 %-100 %] 100 % (08/28 1213) Weight:  [95.3 kg] 95.3 kg (08/28 0830)  Weight change:  Filed Weights   01/23/22 0916 01/23/22 1254 01/26/22 0830  Weight: 91.1 kg 89.6 kg 95.3 kg    Intake/Output: I/O last 3 completed shifts: In: 220 [P.O.:220] Out: -    Intake/Output this shift:  Total I/O In: -  Out: 2000 [Other:2000]  Physical Exam: General: NAD  Head: Normocephalic, atraumatic. Moist oral mucosal membranes  Eyes: Anicteric  Lungs:  Clear to auscultation, normal effort, room air  Heart: Regular rate and rhythm  Abdomen:  Soft, nontender, non distended  Extremities:  trace peripheral edema.  Neurologic: Alert, answer simple questions  Skin: No lesions   Access: Lt AVF     Basic Metabolic Panel: Recent Labs  Lab 01/26/22 0820  NA 133*  K 5.3*  CL 95*  CO2 27  GLUCOSE 80  BUN 105*  CREATININE 7.19*  CALCIUM 8.9  PHOS 5.0*     Liver Function Tests: Recent Labs  Lab 01/26/22 0820  ALBUMIN 2.7*    No results for input(s): "LIPASE", "AMYLASE" in the last 168 hours. No results for input(s): "AMMONIA" in the last 168 hours.  CBC: Recent Labs  Lab 01/20/22 0637 01/26/22 0828  WBC 4.1 4.6  NEUTROABS  --  3.0  HGB 9.0* 9.2*  HCT 27.2* 28.1*  MCV 103.8* 103.7*  PLT 205 196     Cardiac Enzymes: No results for input(s): "CKTOTAL", "CKMB", "CKMBINDEX", "TROPONINI" in the last 168 hours.  BNP: Invalid input(s): "POCBNP"  CBG: No results for input(s): "GLUCAP" in the last 168 hours.   Microbiology: Results for orders placed or performed during the hospital encounter of 12/15/21  SARS Coronavirus 2 by RT PCR (hospital order, performed in [facility] hospital lab) *cepheid single result test* Anterior Nasal Swab     Status: None   Collection Time: 12/15/21  5:50 PM   Specimen: Anterior Nasal Swab  Result Value Ref Range Status   SARS Coronavirus 2 by RT PCR NEGATIVE NEGATIVE Final    Comment: (NOTE) SARS-CoV-2 target nucleic acids are NOT DETECTED.  The SARS-CoV-2 RNA is generally detectable in upper and lower respiratory specimens during the acute phase of infection. The lowest concentration of SARS-CoV-2 viral copies this assay can detect is 250 copies / mL. A negative result does not  preclude SARS-CoV-2 infection and should not be used as the sole basis for treatment or other patient management decisions.  A negative result may occur with improper specimen collection / handling, submission of specimen other than nasopharyngeal swab, presence of viral mutation(s) within the areas targeted by this assay, and inadequate number of viral copies (<250 copies / mL). A negative result must be combined with  clinical observations, patient history, and epidemiological information.  Fact Sheet for Patients:   https://www.patel.info/  Fact Sheet for Healthcare Providers: https://hall.com/  This test is not yet approved or  cleared by the [place] FDA and has been authorized for detection and/or diagnosis of SARS-CoV-2 by FDA under an Emergency Use Authorization (EUA).  This EUA will remain in effect (meaning this test can be used) for the duration of the COVID-19 declaration under Section 564(b)(1) of the Act, 21 U.S.C. section 360bbb-3(b)(1), unless the authorization is terminated or revoked sooner.  Performed at [facility], [address] [place], [place] [address]   MRSA Next Gen by PCR, Nasal     Status: None   Collection Time: 12/15/21  9:03 PM   Specimen: Nasal Mucosa; Nasal Swab  Result Value Ref Range Status   MRSA by PCR Next Gen NOT DETECTED NOT DETECTED Final    Comment: (NOTE) The GeneXpert MRSA Assay (FDA approved for NASAL specimens only), is one component of a comprehensive MRSA colonization surveillance program. It is not intended to diagnose MRSA infection nor to guide or monitor treatment for MRSA infections. Test performance is not FDA approved in patients less than 47 years old. Performed at [facility], [address] [place], [place] [address]     Coagulation Studies: No results for input(s): "LABPROT", "INR" in the last 72 hours.  Urinalysis: No results for input(s): "COLORURINE", "LABSPEC", "PHURINE", "GLUCOSEU", "HGBUR", "BILIRUBINUR", "KETONESUR", "PROTEINUR", "UROBILINOGEN", "NITRITE", "LEUKOCYTESUR" in the last 72 hours.  Invalid input(s): "APPERANCEUR"    Imaging: No results found.   Medications:    anticoagulant sodium citrate      (feeding supplement) PROSource Plus  30 mL Oral TID BM   amLODipine  10 mg Oral q morning   calcium acetate  667 mg Oral TID WC    carbamazepine  1,000 mg Oral q AM   Chlorhexidine Gluconate Cloth  6 each Topical Daily   cloNIDine  0.2 mg Transdermal Weekly   doxazosin  4 mg Oral QHS   epoetin alfa       epoetin (EPOGEN/PROCRIT) injection  10,000 Units Intravenous Q M,W,F-HD   ferrous sulfate  325 mg Oral Q breakfast   fluPHENAZine  2.5 mg Oral q AM   heparin  5,000 Units Subcutaneous Q8H   hydrALAZINE  25 mg Oral TID   irbesartan  300 mg Oral Daily   isosorbide mononitrate  60 mg Oral BID   minoxidil  10 mg Oral Daily   mometasone-formoterol  2 puff Inhalation BID   multivitamin  1 tablet Oral QHS   nystatin  5 mL Oral QID   mouth rinse  15 mL Mouth Rinse 4 times per day   pantoprazole  40 mg Oral Daily   patiromer  16.8 g Oral Daily   polyethylene glycol  17 g Oral BID   trihexyphenidyl  2 mg Oral BID WC   albuterol, alteplase, anticoagulant sodium citrate, diphenhydrAMINE, epoetin alfa, haloperidol lactate, heparin, hydrALAZINE, lidocaine (PF), lidocaine-prilocaine, ondansetron **OR** ondansetron (ZOFRAN) IV, mouth rinse, pentafluoroprop-tetrafluoroeth, polyethylene glycol, senna-docusate  Assessment/ Plan:  Mr. Pamela  Mulvaney is a 61 y.o.  male with past medical history including hypertension, anemia, schizophrenia and end-stage renal disease on hemodialysis.  Patient has presented to the ED with altered mental status.  Patient has been admitted for Altered mental status [R41.82] Hypertensive urgency [I16.0] Severe hypertension [I10] Altered mental status, unspecified altered mental status type [R41.82]  [facility]/MWF/Lt AVF  End-stage renal disease with hyperkalemia on hemodialysis.  Will maintain outpatient schedule if possible.   Currently receiving dialysis, UF goal 2 L as tolerated.  Next treatment scheduled for Wednesday.  Potassium acceptable, 4.4.  We will continue to manage this with dialysis.     2. Anemia of chronic kidney disease Lab Results  Component Value Date   HGB 9.2 (L)  01/26/2022    Hemoglobin within acceptable target.  Continue EPO with dialysis.   3. Secondary Hyperparathyroidism:  Lab Results  Component Value Date   CALCIUM 8.9 01/26/2022   PHOS 5.0 (H) 01/26/2022  Calcium and phosphorus within acceptable range. Continue calcium acetate with meals.  4.  Hypertension with chronic kidney disease.  Home regimen includes amlodipine, clonidine, hydralazine, isosorbide, and losartan.    Patient is currently on amlodipine 10 mg p.o. daily Hydralazine 25 mg p.o. 3 times daily Irbesartan 300 mg p.o. daily Isosorbide 60 mg p.o. twice daily Doxazosin 4 mg p.o. daily Minoxidil 10 mg p.o. daily  Continue current regimen.  Blood pressure 115/53 during dialysis.   LOS: Ray 8/28/202312:28 PM

## 2022-01-26 NOTE — Progress Notes (Signed)
OT Cancellation Note  Patient Details Name: Todd Brown MRN: [id number] DOB: 07-Feb-1961   Cancelled Treatment:    Reason Eval/Treat Not Completed: Patient declined, no reason specified. OT/PT attempting to see pt for treatment session. Pt endorsing fatigue from dialysis. When attempting to remove blanket to prepare for OOB mobility, pt pulled it back up to his chest. OT will f/u at next available service date.  Doneta Public 01/26/2022, 3:49 PM

## 2022-01-26 NOTE — Progress Notes (Signed)
[facility] Progress Note  Patient: Todd Brown    FIE:[id number]  DOA: 12/15/2021    Date of Service: the patient was seen and examined on 01/26/2022  Brief hospital course: 61 year old male with past medical history of hypertension and end-stage renal disease on hemodialysis presented to the emergency room from his group home/Assisted living facility on 7/17 with confusion.  (Patient's baseline was that he is unable to care for himself and has a legal guardian.)  Patient last received dialysis on Friday, 7/14.    Patient found to have markedly elevated blood pressure with a systolic in the 166A requiring Cardene drip and placement of patient to the ICU.  Patient with questionable cardiac ischemia as well.  CT scan of head and neurological exam unremarkable.    Patient was admitted to the hospitalist service and started on his oral antihypertensives, nicardipine drip and given IV labetalol and as needed IV hydralazine.  Nephrology consulted and resumed patient's dialysis.  Over the next few weeks, blood pressure better controlled.    At times became agitated and seen by psychiatry and started on scheduled and as needed Haldol, leading to more compliance with better mentation as well as taking his antihypertensives.    Hospital course complicated by episode of periorbital swelling bilaterally felt to be secondary to subcutaneous emphysema, resolved on steroids.  Also had episode of oral thrush treated with Magic mouthwash.    Patient's assisted living facility willing to take patient back after becomes much stronger at skilled nursing facility.  Skilled nursing facility search still in progress, apparently with no bed offers thus far.  Assessment and Plan: * Acute metabolic encephalopathy-resolved as of 12/31/2021 Felt to be secondary to malignant hypertension.  Resolved and now back at baseline.  Had some increased somnolence which improved after stopping his scheduled Benadryl and  Haldol.  Hyperkalemia Potassiums have been trending upward for the last 2 days and patient started on Veltassa.  K today (8/28) is 5.3, expect normalization after dialysis today.  Monitor BMP.  Hypertensive emergency-resolved as of 12/31/2021 Resolved.  See below.  ESRD on dialysis [facility]) Nephrology consulted, full dialysis session following admission and again today to keep him on his Monday Wednesday Friday schedule. -Continue scheduled dialysis  Mental health disorder Unspecified.  Patient is unable to care for himself.  It is unclear if he is ever had a formal diagnosis.  He is on Tegretol and Artane which he should continue.  As needed Haldol for acute behavior agitation as above.  He is for the most part much more calm and cooperative these days over times, he intermittently refused vitals being checked or taking his medications.  Essential hypertension Resumed on his home p.o. amlodipine 10 mg daily, clonidine 0.3 mg p.o. 3 times daily, hydralazine 100 mg p.o. 3 times daily, isosorbide mononitrate 60 mg p.o. twice daily, losartan 100 mg daily.  Pressures at time improved, but then blood pressure starts to spike again.  As above, changing ARB and adding alpha-blocker. -Nephrology added clonidine patches with p.o. clonidine. -Minoxidil was then added and titrated up to 10 mg daily.  During the first week of August, patient occasionally had episodes of lower blood pressure without any blood pressure spikes, so some of his newer medications were decreased: hydralazine and minoxidil was scaled back.  With blood pressure on the rise again, have increased minoxidil back to 10 mg and hydralazine up to 10 mg 3 times daily.  Pressures better after dialysis.  Oral thrush 8/23: start nystatin  suspension  Subcutaneous emphysema (HCC)-resolved as of 12/18/2021 On steroids.  Appreciate pulmonary assistance.  Subcutaneous emphysema looks to be resolved.  Changed from Solu-Medrol to p.o. prednisone  quick taper.  Physical deconditioning PT OT are recommending SNF.  However, this is similar to a previous hospitalization where TOC was unable to find skilled nursing and over time, patient's strength improved that he was able to be discharged back to his assisted living facility.  Anemia of chronic disease Hbg stable. Check CBC ~weekly.  Malnutrition of moderate degree Nutrition Status: Nutrition Problem: Moderate Malnutrition Etiology: chronic illness (ESRD on HD) Signs/Symptoms: mild fat depletion, mild muscle depletion, moderate muscle depletion Interventions: Refer to RD note for recommendations, Nepro shake, MVI, Prostat       Body mass index is 27.9 kg/m.  Nutrition Problem: Moderate Malnutrition Etiology: chronic illness (ESRD on HD)     Consultants: Nephrology Critical care Psychiatry  Procedures: Hemodialysis   Antimicrobials: None  Code Status: Full code   Subjective: Pt sleeping soundly, seen in dialysis.  He wakes briefly and denies acute complaints including pain or feeling sick. No acute events reported.  Objective: Vitals:   01/26/22 1213 01/26/22 1439  BP: 115/63 101/61  Pulse: (!) 58 66  Resp: 10 18  Temp: 97.9 F (36.6 C) 98.9 F (37.2 C)  SpO2: 100% 100%   Filed Weights   01/23/22 1254 01/26/22 0830 01/26/22 1213  Weight: 89.6 kg 95.3 kg 93.3 kg   Body mass index is 27.9 kg/m.  Exam: General exam: sleeping comfortable, awakens briefly, no acute distress Respiratory system: on room air, normal respiratory effort. Cardiovascular system: RRR, lower extremity edema appears stable Gastrointestinal system: soft, non-tender Central nervous system: exam limited by somnolence Extremities: stable lower extremity edema Psychiatry: exam limited by somnolence   Data Reviewed: K 5.3, phos 5.0, albumin 2.7, Hbg stable 9.2   Disposition:  Status is: Inpatient Remains inpatient appropriate because:  --Awaiting SNF  placement  Anticipated discharge date: Unknown  Family Communication: None   DVT Prophylaxis: heparin injection 5,000 Units Start: 12/15/21 1645 Place TED hose Start: 12/15/21 1629    Author: Ezekiel Slocumb ,DO 01/26/2022 3:15 PM  To reach On-call, see care teams to locate the attending and reach out via [email or web address]. Between 7PM-7AM, please contact night-coverage If you still have difficulty reaching the attending provider, please page the [facility] (Director on Call) for [facility] on amion for assistance.

## 2022-01-26 NOTE — Progress Notes (Signed)
PT Cancellation Note  Patient Details Name: Todd Brown MRN: [id number] DOB: 04/09/1961   Cancelled Treatment:    Reason Eval/Treat Not Completed: Fatigue/lethargy limiting ability to participate Pt returned from dialysis and offered/encouraged session.  Pt appears generally fatigued and when we tried to remove blanket he pulled it back up to his chest.  Will defer session until tomorrow in hopes of improved ability to participate and outcomes.   Chesley Noon 01/26/2022, 3:42 PM

## 2022-01-26 NOTE — Progress Notes (Signed)
Hd tx of 3.5hrs completed. 84L total vol processed. No complications. Report given to joanna dorthy, rn. Total uf removed: 2040ml Post hd v/s: 97.9 115/63(79) 58 10 100% Post hd wt: 93.3kgs

## 2022-01-27 DIAGNOSIS — N186 End stage renal disease: Secondary | ICD-10-CM | POA: Diagnosis not present

## 2022-01-27 DIAGNOSIS — E875 Hyperkalemia: Secondary | ICD-10-CM | POA: Diagnosis not present

## 2022-01-27 DIAGNOSIS — E440 Moderate protein-calorie malnutrition: Secondary | ICD-10-CM | POA: Diagnosis not present

## 2022-01-27 DIAGNOSIS — G9341 Metabolic encephalopathy: Secondary | ICD-10-CM | POA: Diagnosis not present

## 2022-01-27 LAB — BASIC METABOLIC PANEL
Anion gap: 10 (ref 5–15)
BUN: 81 mg/dL — ABNORMAL HIGH (ref 8–23)
CO2: 29 mmol/L (ref 22–32)
Calcium: 8.6 mg/dL — ABNORMAL LOW (ref 8.9–10.3)
Chloride: 98 mmol/L (ref 98–111)
Creatinine, Ser: 5.72 mg/dL — ABNORMAL HIGH (ref 0.61–1.24)
GFR, Estimated: 11 mL/min — ABNORMAL LOW (ref >60)
Glucose, Bld: 77 mg/dL (ref 70–99)
Potassium: 4.5 mmol/L (ref 3.5–5.1)
Sodium: 137 mmol/L (ref 135–145)

## 2022-01-27 NOTE — Progress Notes (Signed)
Physical Therapy Treatment Patient Details Name: Todd Brown MRN: [id number] DOB: 12-28-1960 Today's Date: 01/27/2022   History of Present Illness 61 year old male with past medical history of hypertension and end-stage renal disease on hemodialysis presented to the emergency room from his group home/skilled nursing facility on 7/17 with confusion.  (Patient's baseline is that he is unable to care for himself and has a legal guardian.)  Patient last received dialysis on Friday, 7/14.  From being in the emergency room on 7/17, unable to get dialysis yesterday.  Patient found to have markedly elevated blood pressure with a systolic in the 024O.  Patient with questionable cardiac ischemia as well.    PT Comments    Pt appearing to be sleeping in bed upon PT arrival but woken easily with vc's.  During session pt mod assist semi-supine to sitting edge of bed and able to sit on edge of bed with SBA for about 10 minutes.  Therapist attempted to have pt stand up to RW but pt did not initiate to assist/attempt with max cueing and encouragement so eventually assisted pt back to bed and repositioned with 2 assist.  Will continue to focus on strengthening, balance, and progressive functional mobility during hospitalization.    Recommendations for follow up therapy are one component of a multi-disciplinary discharge planning process, led by the attending physician.  Recommendations may be updated based on patient status, additional functional criteria and insurance authorization.  Follow Up Recommendations  Skilled nursing-short term rehab (<3 hours/day) Can patient physically be transported by private vehicle: No   Assistance Recommended at Discharge Frequent or constant Supervision/Assistance  Patient can return home with the following Two people to help with walking and/or transfers;Two people to help with bathing/dressing/bathroom;Help with stairs or ramp for entrance;Assist for  transportation;Assistance with cooking/housework   Equipment Recommendations  Rolling walker (2 wheels);BSC/3in1;Wheelchair cushion (measurements PT);Wheelchair (measurements PT)    Recommendations for Other Services       Precautions / Restrictions Precautions Precautions: Fall Precaution Comments: vision impaired Restrictions Weight Bearing Restrictions: No RLE Weight Bearing: Weight bearing as tolerated LLE Weight Bearing: Weight bearing as tolerated     Mobility  Bed Mobility Overal bed mobility: Needs Assistance Bed Mobility: Supine to Sit; Sit to Supine     Supine to sit: Mod assist, HOB elevated Sit to supine: +2 for physical assistance   General bed mobility comments: vc's and assist to initiate movement for bed mobility; cueing for sequencing    Transfers Overall transfer level:  (deferred--pt did not initiate to assist with standing)                      Ambulation/Gait                   Stairs             Wheelchair Mobility    Modified Rankin (Stroke Patients Only)       Balance Overall balance assessment: Needs assistance Sitting-balance support: No upper extremity supported, Feet supported Sitting balance-[name]: Fair Sitting balance - Comments: steady sitting edge of bed                                    Cognition Arousal/Alertness:  (Initially sleeping but woken with vc's and then awake) Behavior During Therapy: Flat affect Overall Cognitive Status: Difficult to assess Area of Impairment: Orientation, Attention, Memory, Following commands, Safety/judgement,  Awareness, Problem solving                 Orientation Level: Disoriented to, Situation, Time Current Attention Level: Alternating Memory: Decreased short-term memory Following Commands: Follows one step commands with increased time, Follows one step commands inconsistently Safety/Judgement: Decreased awareness of safety, Decreased  awareness of deficits Awareness: Intellectual Problem Solving: Slow processing, Decreased initiation, Difficulty sequencing, Requires verbal cues, Requires tactile cues          Exercises      General Comments  Nursing cleared pt for participation in physical therapy.  Pt agreeable to PT session.      Pertinent Vitals/Pain Pain Assessment Pain Assessment: Faces Pain Intervention(s): Limited activity within patient's tolerance, Monitored during session, Repositioned    Home Living                          Prior Function            PT Goals (current goals can now be found in the care plan section) Acute Rehab PT Goals Patient Stated Goal: none stated PT Goal Formulation: Patient unable to participate in goal setting Time For Goal Achievement: 02/06/22 Potential to Achieve Goals: Fair Progress towards PT goals: Not progressing toward goals - comment (pt did not attempt to stand today with max cueing)    Frequency    Min 2X/week      PT Plan Current plan remains appropriate    Co-evaluation              AM-PAC PT "6 Clicks" Mobility   Outcome Measure  Help needed turning from your back to your side while in a flat bed without using bedrails?: A Lot Help needed moving from lying on your back to sitting on the side of a flat bed without using bedrails?: A Lot Help needed moving to and from a bed to a chair (including a wheelchair)?: Total Help needed standing up from a chair using your arms (e.g., wheelchair or bedside chair)?: Total Help needed to walk in hospital room?: Total Help needed climbing 3-5 steps with a railing? : Total 6 Click Score: 8    End of Session Equipment Utilized During Treatment: Gait belt Activity Tolerance: Patient tolerated treatment well Patient left: in bed;with call bell/phone within reach;with bed alarm set;Other (comment) (B heels floating via pillow support) Nurse Communication: Mobility status;Precautions PT  Visit Diagnosis: Other abnormalities of gait and mobility (R26.89);Muscle weakness (generalized) (M62.81);Unsteadiness on feet (R26.81)     Time: 1510-1530 PT Time Calculation (min) (ACUTE ONLY): 20 min  Charges:  $Therapeutic Activity: 8-22 mins                    Leitha Bleak, PT 01/27/22, 4:59 PM

## 2022-01-27 NOTE — Progress Notes (Addendum)
[facility] Progress Note  Patient: Todd Brown    GMW:[id number]  DOA: 12/15/2021    Date of Service: the patient was seen and examined on 01/27/2022  Brief hospital course: 61 year old male with past medical history of hypertension and end-stage renal disease on hemodialysis presented to the emergency room from his group home/Assisted living facility on 7/17 with confusion.  (Patient's baseline was that he is unable to care for himself and has a legal guardian.)  Patient last received dialysis on Friday, 7/14.    Patient found to have markedly elevated blood pressure with a systolic in the 440H requiring Cardene drip and placement of patient to the ICU.  Patient with questionable cardiac ischemia as well.  CT scan of head and neurological exam unremarkable.    Patient was admitted to the hospitalist service and started on his oral antihypertensives, nicardipine drip and given IV labetalol and as needed IV hydralazine.  Nephrology consulted and resumed patient's dialysis.  Over the next few weeks, blood pressure better controlled.    At times became agitated and seen by psychiatry and started on scheduled and as needed Haldol, leading to more compliance with better mentation as well as taking his antihypertensives.    Hospital course complicated by episode of periorbital swelling bilaterally felt to be secondary to subcutaneous emphysema, resolved on steroids.  Also had episode of oral thrush treated with Magic mouthwash.    Patient's assisted living facility willing to take patient back after becomes much stronger at skilled nursing facility.  Skilled nursing facility search still in progress, apparently with no bed offers thus far.  Assessment and Plan: * Acute metabolic encephalopathy-resolved as of 12/31/2021 Felt to be secondary to malignant hypertension.  Resolved and now back at baseline.  Had some increased somnolence which improved after stopping his scheduled Benadryl and  Haldol.  Hyperkalemia Potassiums have been trending upward for the past few days and patient started on Veltassa.  After dialysis on 8/28, potassium on 8/29 at 4.5.  Continue to follow.  Hypertensive emergency-resolved as of 12/31/2021 Resolved.  See below.  ESRD on dialysis [facility]) Nephrology consulted, full dialysis session following admission and again today to keep him on his Monday Wednesday Friday schedule. -Continue scheduled dialysis  Mental health disorder Unspecified.  Patient is unable to care for himself.  It is unclear if he is ever had a formal diagnosis.  He is on Tegretol and Artane which he should continue.  As needed Haldol for acute behavior agitation as above.  He is for the most part much more calm and cooperative these days over times, he intermittently refused vitals being checked or taking his medications.  Essential hypertension Resumed on his home p.o. amlodipine 10 mg daily, clonidine 0.3 mg p.o. 3 times daily, hydralazine 100 mg p.o. 3 times daily, isosorbide mononitrate 60 mg p.o. twice daily, losartan 100 mg daily.  Pressures at time improved, but then blood pressure starts to spike again.  As above, changing ARB and adding alpha-blocker. -Nephrology added clonidine patches with p.o. clonidine. -Minoxidil was then added and titrated up to 10 mg daily.  During the first week of August, patient occasionally had episodes of lower blood pressure without any blood pressure spikes, so some of his newer medications were decreased: hydralazine and minoxidil was scaled back.  With blood pressure on the rise again, have increased minoxidil back to 10 mg and hydralazine up to 10 mg 3 times daily.  Pressures better after dialysis.  Oral thrush 8/23: start nystatin  suspension  Subcutaneous emphysema (HCC)-resolved as of 12/18/2021 On steroids.  Appreciate pulmonary assistance.  Subcutaneous emphysema looks to be resolved.  Changed from Solu-Medrol to p.o. prednisone quick  taper.  Physical deconditioning PT OT are recommending SNF.  However, this is similar to a previous hospitalization where TOC was unable to find skilled nursing and over time, patient's strength improved that he was able to be discharged back to his assisted living facility.  Anemia of chronic disease Hbg stable. Check CBC ~weekly.  Malnutrition of moderate degree Nutrition Status: Nutrition Problem: Moderate Malnutrition Etiology: chronic illness (ESRD on HD) Signs/Symptoms: mild fat depletion, mild muscle depletion, moderate muscle depletion Interventions: Refer to RD note for recommendations, Nepro shake, MVI, Prostat       Body mass index is 27.9 kg/m.  Nutrition Problem: Moderate Malnutrition Etiology: chronic illness (ESRD on HD)     Consultants: Nephrology Critical care Psychiatry  Procedures: Hemodialysis   Antimicrobials: None  Code Status: Full code   Subjective: Patient more withdrawn today.  Did not want to participate with physical therapy.  Objective: Vitals:   01/27/22 0405 01/27/22 0832  BP: 133/74 (!) 146/68  Pulse: 66 64  Resp:  17  Temp:  (!) 97.5 F (36.4 C)  SpO2: 100% 100%   Filed Weights   01/23/22 1254 01/26/22 0830 01/26/22 1213  Weight: 89.6 kg 95.3 kg 93.3 kg   Body mass index is 27.9 kg/m.  Exam: General exam: Oriented x3, no acute distress Respiratory system: Clear to auscultation bilaterally Cardiovascular system: Regular rate and rhythm, S1-S2 Gastrointestinal system: soft, nondistended, nontender, hypoactive bowel sounds Extremity: No clubbing or cyanosis, trace pitting edema Psychiatry: Withdrawn, but does not appear to be in acute psychosis   Data Reviewed: Potassium down to 4.5   Disposition:  Status is: Inpatient Remains inpatient appropriate because:  --Awaiting SNF placement  Anticipated discharge date: Unknown  Family Communication: None   DVT Prophylaxis: heparin injection 5,000 Units Start:  12/15/21 1645 Place TED hose Start: 12/15/21 1629    Author: Annita Brod , MD 01/27/2022 4:03 PM  To reach On-call, see care teams to locate the attending and reach out via [email or web address]. Between 7PM-7AM, please contact night-coverage If you still have difficulty reaching the attending provider, please page the [facility] (Director on Call) for [facility] on amion for assistance.

## 2022-01-27 NOTE — TOC Progression Note (Signed)
Transition of Care [facility]) - Progression Note    Patient Details  Name: Todd Brown MRN: [id number] Date of Birth: 1961/03/25  [facility] CM/SW Contact  Laurena Slimmer, RN Phone Number: 01/27/2022, 1:41 PM  Clinical Narrative:    Left a message admissions at [facility] for update on initial referral sent    Expected Discharge Plan: [place] Barriers to Discharge: Continued Medical Work up  Expected Discharge Plan and Services Expected Discharge Plan: [place] arrangements for the past 2 months: [place] Expected Discharge Date: 12/19/21                                     Social Determinants of Health (SDOH) Interventions    Readmission Risk Interventions     No data to display

## 2022-01-27 NOTE — Progress Notes (Signed)
OT Cancellation Note  Patient Details Name: Todd Brown MRN: [id number] DOB: 04-12-1961   Cancelled Treatment:    Reason Eval/Treat Not Completed: Other (comment). OT attempting to see pt for tx session. NT present in room giving pt a bed bath and reporting pt feeling fatigued after working with PT. OT to f/u at next available service date.   Doneta Public 01/27/2022, 5:11 PM

## 2022-01-27 NOTE — Progress Notes (Signed)
Continuing to follow for outpatient hemodialysis placement. As a reminder, patient has been discharged from home clinic due to extended admission. Patient will need to be reestablished before discharge.

## 2022-01-27 NOTE — Progress Notes (Signed)
[facility]  ROUNDING NOTE   Subjective:   Todd Brown is a 61 year old male with past medical history including hypertension, anemia, schizophrenia and end-stage renal disease on hemodialysis.  Patient has presented to the ED with altered mental status.  Patient has been admitted for Altered mental status [R41.82] Hypertensive urgency [I16.0] Severe hypertension [I10] Altered mental status, unspecified altered mental status type [R41.82]  Patient is known to our practice and receives outpatient dialysis treatments at [facility] on a MWF schedule.   Update Patient seen sitting up in bed, awaiting assistance with breakfast Remains on room air Denies pain or discomfort    Objective:  Vital signs in last 24 hours:  Temp:  [97.5 F (36.4 C)-98.9 F (37.2 C)] 97.5 F (36.4 C) (08/29 0832) Pulse Rate:  [64-73] 64 (08/29 0832) Resp:  [17-18] 17 (08/29 0832) BP: (95-146)/(54-74) 146/68 (08/29 0832) SpO2:  [97 %-100 %] 100 % (08/29 0832)  Weight change:  Filed Weights   01/23/22 1254 01/26/22 0830 01/26/22 1213  Weight: 89.6 kg 95.3 kg 93.3 kg    Intake/Output: I/O last 3 completed shifts: In: -  Out: 2000 [Other:2000]   Intake/Output this shift:  No intake/output data recorded.  Physical Exam: General: NAD  Head: Normocephalic, atraumatic. Moist oral mucosal membranes  Eyes: Anicteric  Lungs:  Clear to auscultation, normal effort, room air  Heart: Regular rate and rhythm  Abdomen:  Soft, nontender, non distended  Extremities:  trace peripheral edema.  Neurologic: Alert, answer simple questions  Skin: No lesions  Access: Lt AVF     Basic Metabolic Panel: Recent Labs  Lab 01/26/22 0820 01/27/22 0513  NA 133* 137  K 5.3* 4.5  CL 95* 98  CO2 27 29  GLUCOSE 80 77  BUN 105* 81*  CREATININE 7.19* 5.72*  CALCIUM 8.9 8.6*  PHOS 5.0*  --      Liver Function Tests: Recent Labs  Lab 01/26/22 0820  ALBUMIN 2.7*     No results for  input(s): "LIPASE", "AMYLASE" in the last 168 hours. No results for input(s): "AMMONIA" in the last 168 hours.  CBC: Recent Labs  Lab 01/26/22 0828  WBC 4.6  NEUTROABS 3.0  HGB 9.2*  HCT 28.1*  MCV 103.7*  PLT 196     Cardiac Enzymes: No results for input(s): "CKTOTAL", "CKMB", "CKMBINDEX", "TROPONINI" in the last 168 hours.  BNP: Invalid input(s): "POCBNP"  CBG: No results for input(s): "GLUCAP" in the last 168 hours.   Microbiology: Results for orders placed or performed during the hospital encounter of 12/15/21  SARS Coronavirus 2 by RT PCR (hospital order, performed in [facility] hospital lab) *cepheid single result test* Anterior Nasal Swab     Status: None   Collection Time: 12/15/21  5:50 PM   Specimen: Anterior Nasal Swab  Result Value Ref Range Status   SARS Coronavirus 2 by RT PCR NEGATIVE NEGATIVE Final    Comment: (NOTE) SARS-CoV-2 target nucleic acids are NOT DETECTED.  The SARS-CoV-2 RNA is generally detectable in upper and lower respiratory specimens during the acute phase of infection. The lowest concentration of SARS-CoV-2 viral copies this assay can detect is 250 copies / mL. A negative result does not preclude SARS-CoV-2 infection and should not be used as the sole basis for treatment or other patient management decisions.  A negative result may occur with improper specimen collection / handling, submission of specimen other than nasopharyngeal swab, presence of viral mutation(s) within the areas targeted by this assay, and  inadequate number of viral copies (<250 copies / mL). A negative result must be combined with clinical observations, patient history, and epidemiological information.  Fact Sheet for Patients:   https://www.patel.info/  Fact Sheet for Healthcare Providers: https://hall.com/  This test is not yet approved or  cleared by the [place] FDA and has been authorized for detection  and/or diagnosis of SARS-CoV-2 by FDA under an Emergency Use Authorization (EUA).  This EUA will remain in effect (meaning this test can be used) for the duration of the COVID-19 declaration under Section 564(b)(1) of the Act, 21 U.S.C. section 360bbb-3(b)(1), unless the authorization is terminated or revoked sooner.  Performed at [facility], [address] [place], [place] [address]   MRSA Next Gen by PCR, Nasal     Status: None   Collection Time: 12/15/21  9:03 PM   Specimen: Nasal Mucosa; Nasal Swab  Result Value Ref Range Status   MRSA by PCR Next Gen NOT DETECTED NOT DETECTED Final    Comment: (NOTE) The GeneXpert MRSA Assay (FDA approved for NASAL specimens only), is one component of a comprehensive MRSA colonization surveillance program. It is not intended to diagnose MRSA infection nor to guide or monitor treatment for MRSA infections. Test performance is not FDA approved in patients less than 54 years old. Performed at [facility], [address] [place], [place] [address]     Coagulation Studies: No results for input(s): "LABPROT", "INR" in the last 72 hours.  Urinalysis: No results for input(s): "COLORURINE", "LABSPEC", "PHURINE", "GLUCOSEU", "HGBUR", "BILIRUBINUR", "KETONESUR", "PROTEINUR", "UROBILINOGEN", "NITRITE", "LEUKOCYTESUR" in the last 72 hours.  Invalid input(s): "APPERANCEUR"    Imaging: No results found.   Medications:    anticoagulant sodium citrate      (feeding supplement) PROSource Plus  30 mL Oral TID BM   amLODipine  10 mg Oral q morning   calcium acetate  667 mg Oral TID WC   carbamazepine  1,000 mg Oral q AM   Chlorhexidine Gluconate Cloth  6 each Topical Daily   cloNIDine  0.2 mg Transdermal Weekly   doxazosin  4 mg Oral QHS   epoetin (EPOGEN/PROCRIT) injection  10,000 Units Intravenous Q M,W,F-HD   ferrous sulfate  325 mg Oral Q breakfast   fluPHENAZine  2.5 mg Oral q AM   heparin  5,000 Units Subcutaneous  Q8H   hydrALAZINE  25 mg Oral TID   irbesartan  300 mg Oral Daily   isosorbide mononitrate  60 mg Oral BID   minoxidil  10 mg Oral Daily   mometasone-formoterol  2 puff Inhalation BID   multivitamin  1 tablet Oral QHS   nystatin  5 mL Oral QID   mouth rinse  15 mL Mouth Rinse 4 times per day   pantoprazole  40 mg Oral Daily   patiromer  16.8 g Oral Daily   polyethylene glycol  17 g Oral BID   trihexyphenidyl  2 mg Oral BID WC   albuterol, alteplase, anticoagulant sodium citrate, diphenhydrAMINE, haloperidol lactate, heparin, hydrALAZINE, lidocaine (PF), lidocaine-prilocaine, ondansetron **OR** ondansetron (ZOFRAN) IV, mouth rinse, pentafluoroprop-tetrafluoroeth, polyethylene glycol, senna-docusate  Assessment/ Plan:  [name] is a 61 y.o.  male with past medical history including hypertension, anemia, schizophrenia and end-stage renal disease on hemodialysis.  Patient has presented to the ED with altered mental status.  Patient has been admitted for Altered mental status [R41.82] Hypertensive urgency [I16.0] Severe hypertension [I10] Altered mental status, unspecified altered mental status type [R41.82]  [facility]  [place]/MWF/Lt AVF  End-stage renal disease with hyperkalemia on hemodialysis.  Will maintain outpatient schedule if possible.   Patient received dialysis yesterday, UF 2 L achieved.  Next treatment scheduled for Wednesday.  Potassium stable with dialysis.  Continuing to monitor discharge plan to include rehab.  2. Anemia of chronic kidney disease Lab Results  Component Value Date   HGB 9.2 (L) 01/26/2022    Continue EPO with dialysis.   3. Secondary Hyperparathyroidism:  Lab Results  Component Value Date   CALCIUM 8.6 (L) 01/27/2022   PHOS 5.0 (H) 01/26/2022  Bone minerals within acceptable range. Continue calcium acetate with meals.  4.  Hypertension with chronic kidney disease.  Home regimen includes amlodipine, clonidine, hydralazine, isosorbide,  and losartan.    Patient is currently on amlodipine 10 mg p.o. daily Hydralazine 25 mg p.o. 3 times daily Irbesartan 300 mg p.o. daily Isosorbide 60 mg p.o. twice daily Doxazosin 4 mg p.o. daily Minoxidil 10 mg p.o. daily  Continue current regimen.  Blood pressure 146/68, acceptable for this patient.   LOS: [place] 8/29/20231:24 PM

## 2022-01-28 MED ORDER — EPOETIN ALFA 10000 UNIT/ML IJ SOLN
INTRAMUSCULAR | Status: AC
  Administered 2022-01-28: 10000 [IU] via INTRAVENOUS
  Filled 2022-01-28: qty 1

## 2022-01-28 MED ORDER — HEPARIN SODIUM (PORCINE) 1000 UNIT/ML IJ SOLN
INTRAMUSCULAR | Status: AC
  Filled 2022-01-28: qty 10

## 2022-01-28 NOTE — Progress Notes (Signed)
Physical Therapy Treatment Patient Details Name: Todd Brown MRN: [id number] DOB: [birth year] Today's Date: 01/28/2022   History of Present Illness 61 year old male with past medical history of hypertension and end-stage renal disease on hemodialysis presented to the emergency room from his group home/skilled nursing facility on 7/17 with confusion.  (Patient's baseline is that he is unable to care for himself and has a legal guardian.)  Patient last received dialysis on Friday, 7/14.  From being in the emergency room on 7/17, unable to get dialysis yesterday.  Patient found to have markedly elevated blood pressure with a systolic in the 283M.  Patient with questionable cardiac ischemia as well.    PT Comments    Patient alert once woken fully. Does answer "yeah" periodically. Followed 1-2 commands throughout session. maxAx2 for bed mobility, and fair sitting balance (CGA-supervision for safety, did have a posterior lean towards end of mobility requiring assist to maintain sitting). Sit <> stand attempted twice. totalAx2 first attempt, unable to come up into fully standing. Second attempt pt participated a bit more, but still ultimately maxAx2 and did not achieve standing. Returned to supine and left with OT at end of session. The patient would benefit from further skilled PT intervention to continue towards goals as able. Recommendation remains appropriate.     Recommendations for follow up therapy are one component of a multi-disciplinary discharge planning process, led by the attending physician.  Recommendations may be updated based on patient status, additional functional criteria and insurance authorization.  Follow Up Recommendations  Skilled nursing-short term rehab (<3 hours/day) Can patient physically be transported by private vehicle: No   Assistance Recommended at Discharge Frequent or constant Supervision/Assistance  Patient can return home with the following Two people to help with  walking and/or transfers;Two people to help with bathing/dressing/bathroom;Help with stairs or ramp for entrance;Assist for transportation;Assistance with cooking/housework   Equipment Recommendations  Rolling walker (2 wheels);BSC/3in1;Wheelchair cushion (measurements PT);Wheelchair (measurements PT)    Recommendations for Other Services       Precautions / Restrictions Precautions Precautions: Fall Precaution Comments: vision impaired Restrictions Weight Bearing Restrictions: No RLE Weight Bearing: Weight bearing as tolerated LLE Weight Bearing: Weight bearing as tolerated     Mobility  Bed Mobility Overal bed mobility: Needs Assistance Bed Mobility: Supine to Sit, Sit to Supine     Supine to sit: Max assist, +2 for physical assistance Sit to supine: Max assist, +2 for physical assistance        Transfers Overall transfer level: Needs assistance Equipment used: Rolling walker (2 wheels) Transfers: Sit to/from Stand Sit to Stand: Max assist, +2 physical assistance, Total assist           General transfer comment: unable to come up into fully standing    Ambulation/Gait                   Stairs             Wheelchair Mobility    Modified Rankin (Stroke Patients Only)       Balance Overall balance assessment: Needs assistance Sitting-balance support: No upper extremity supported, Feet supported Sitting balance-[name]: Fair Sitting balance - Comments: sitting EOB                                    Cognition Arousal/Alertness: Awake/alert Behavior During Therapy: Flat affect Overall Cognitive Status: Difficult to assess Area of Impairment: Orientation, Attention,  Memory, Following commands, Safety/judgement, Awareness, Problem solving                 Orientation Level: Disoriented to, Situation, Time Current Attention Level: Alternating Memory: Decreased short-term memory Following Commands: Follows one step  commands with increased time, Follows one step commands inconsistently Safety/Judgement: Decreased awareness of safety, Decreased awareness of deficits Awareness: Intellectual Problem Solving: Slow processing, Decreased initiation, Difficulty sequencing, Requires verbal cues, Requires tactile cues General Comments: pt is alert however disoriented and visually impaired        Exercises      General Comments        Pertinent Vitals/Pain Pain Assessment Pain Assessment: Faces Faces Pain Scale: No hurt    Home Living                          Prior Function            PT Goals (current goals can now be found in the care plan section) Progress towards PT goals: Progressing toward goals (limited)    Frequency    Min 2X/week      PT Plan Current plan remains appropriate    Co-evaluation PT/OT/SLP Co-Evaluation/Treatment: Yes Reason for Co-Treatment: Necessary to address cognition/behavior during functional activity;For patient/therapist safety;To address functional/ADL transfers PT goals addressed during session: Mobility/safety with mobility;Balance;Proper use of DME OT goals addressed during session: ADL's and self-care;Proper use of Adaptive equipment and DME      AM-PAC PT "6 Clicks" Mobility   Outcome Measure  Help needed turning from your back to your side while in a flat bed without using bedrails?: A Lot Help needed moving from lying on your back to sitting on the side of a flat bed without using bedrails?: A Lot Help needed moving to and from a bed to a chair (including a wheelchair)?: Total Help needed standing up from a chair using your arms (e.g., wheelchair or bedside chair)?: Total Help needed to walk in hospital room?: Total Help needed climbing 3-5 steps with a railing? : Total 6 Click Score: 8    End of Session Equipment Utilized During Treatment: Gait belt Activity Tolerance: Patient tolerated treatment well Patient left: in bed;with  call bell/phone within reach;with bed alarm set;Other (comment) (OT at bedside) Nurse Communication: Mobility status;Precautions PT Visit Diagnosis: Other abnormalities of gait and mobility (R26.89);Muscle weakness (generalized) (M62.81);Unsteadiness on feet (R26.81)     Time: 6269-4854 PT Time Calculation (min) (ACUTE ONLY): 13 min  Charges:  $Therapeutic Activity: 8-22 mins                     Lieutenant Diego PT, DPT 3:35 PM,01/28/22

## 2022-01-28 NOTE — Progress Notes (Signed)
Occupational Therapy Treatment Patient Details Name: Todd Brown MRN: [id number] DOB: April 08, 1961 Today's Date: 01/28/2022   History of present illness 61 year old male with past medical history of hypertension and end-stage renal disease on hemodialysis presented to the emergency room from his group home/skilled nursing facility on 7/17 with confusion.  (Patient's baseline is that he is unable to care for himself and has a legal guardian.)  Patient last received dialysis on Friday, 7/14.  From being in the emergency room on 7/17, unable to get dialysis yesterday.  Patient found to have markedly elevated blood pressure with a systolic in the 202R.  Patient with questionable cardiac ischemia as well.   OT comments  Upon entering session, pt resting in bed and agreeable to OT/PT co-treatment. Pt demonstrated difficulty following one step commands this date. Pt required Max A x2 for bed mobility and Min A for self-feeding. Pt attempting to stand at EOB, however, unable to come to full standing position on either trial. Pt returned to supine and set up for self-feeding. Pt left with NT present in room and all needs in reach. D/C recommendation remains appropriate. OT will continue to follow acutely.    Recommendations for follow up therapy are one component of a multi-disciplinary discharge planning process, led by the attending physician.  Recommendations may be updated based on patient status, additional functional criteria and insurance authorization.    Follow Up Recommendations  Skilled nursing-short term rehab (<3 hours/day)    Assistance Recommended at Discharge Frequent or constant Supervision/Assistance  Patient can return home with the following  A lot of help with bathing/dressing/bathroom;A lot of help with walking and/or transfers;Assistance with feeding;Assist for transportation;Direct supervision/assist for medications management;Direct supervision/assist for financial  management;Assistance with cooking/housework;Help with stairs or ramp for entrance   Equipment Recommendations  Other (comment) (defer to next venue of care)    Recommendations for Other Services      Precautions / Restrictions Precautions Precautions: Fall Precaution Comments: vision impaired Restrictions Weight Bearing Restrictions: No RLE Weight Bearing: Weight bearing as tolerated LLE Weight Bearing: Weight bearing as tolerated       Mobility Bed Mobility Overal bed mobility: Needs Assistance Bed Mobility: Supine to Sit, Sit to Supine     Supine to sit: Max assist, +2 for physical assistance Sit to supine: Max assist, +2 for physical assistance   General bed mobility comments: vc's and assist to initiate movement for bed mobility; cueing for sequencing    Transfers Overall transfer level: Needs assistance Equipment used: Rolling walker (2 wheels) Transfers: Sit to/from Stand Sit to Stand: Max assist, +2 physical assistance, Total assist           General transfer comment: First attempt for STS, total A x2 and then Max A x2 however unable to come to full standing position either trial.     Balance Overall balance assessment: Needs assistance Sitting-balance support: No upper extremity supported, Feet supported Sitting balance-[name]: Fair Sitting balance - Comments: sitting EOB   Standing balance support: Bilateral upper extremity supported, During functional activity, Reliant on assistive device for balance Standing balance-[name]: Fair                             ADL either performed or assessed with clinical judgement   ADL Overall ADL's : Needs assistance/impaired Eating/Feeding: Set up;Sitting;Supervision/ safety;Minimal assistance Eating/Feeding Details (indicate cue type and reason): Min A for placement of food container and feeding utensil but  then able to complete self-feeding with set up/supervision.                                         Extremity/Trunk Assessment Upper Extremity Assessment Upper Extremity Assessment: Generalized weakness (Able to complete self-feeding with BUEs, however, difficulty with following one step commands to complete BUE AROM  after initial VC. Able to complete place and hold for BUE, unable to tolerate resistance.)   Lower Extremity Assessment Lower Extremity Assessment: Generalized weakness        Vision Baseline Vision/History: 2 Legally blind Patient Visual Report: No change from baseline     Perception     Praxis      Cognition Arousal/Alertness: Awake/alert Behavior During Therapy: Flat affect Overall Cognitive Status: Difficult to assess Area of Impairment: Orientation, Attention, Memory, Following commands, Safety/judgement, Awareness, Problem solving                 Orientation Level: Disoriented to, Situation, Time Current Attention Level: Alternating Memory: Decreased short-term memory Following Commands: Follows one step commands with increased time, Follows one step commands inconsistently Safety/Judgement: Decreased awareness of safety, Decreased awareness of deficits   Problem Solving: Slow processing, Decreased initiation, Difficulty sequencing, Requires verbal cues, Requires tactile cues General Comments: pt is alert however disoriented and visually impaired.        Exercises      Shoulder Instructions       General Comments      Pertinent Vitals/ Pain       Pain Assessment Pain Assessment: Faces Faces Pain Scale: No hurt  Home Living                                          Prior Functioning/Environment              Frequency  Min 2X/week        Progress Toward Goals  OT Goals(current goals can now be found in the care plan section)  Progress towards OT goals: Progressing toward goals  Acute Rehab OT Goals Patient Stated Goal: get better OT Goal Formulation: With patient Time  For Goal Achievement: 02/04/22 Potential to Achieve Goals: [place] Discharge plan remains appropriate;Frequency remains appropriate    Co-evaluation    PT/OT/SLP Co-Evaluation/Treatment: Yes Reason for Co-Treatment: Necessary to address cognition/behavior during functional activity;For patient/therapist safety;To address functional/ADL transfers PT goals addressed during session: Mobility/safety with mobility;Balance;Proper use of DME OT goals addressed during session: ADL's and self-care;Proper use of Adaptive equipment and DME      AM-PAC OT "6 Clicks" Daily Activity     Outcome Measure   Help from another person eating meals?: A Little Help from another person taking care of personal grooming?: A Little Help from another person toileting, which includes using toliet, bedpan, or urinal?: A Lot Help from another person bathing (including washing, rinsing, drying)?: A Lot Help from another person to put on and taking off regular upper body clothing?: A Little Help from another person to put on and taking off regular lower body clothing?: A Little 6 Click Score: 16    End of Session Equipment Utilized During Treatment: Oxygen;Gait belt;Rolling walker (2 wheels)  OT Visit Diagnosis: Unsteadiness on feet (R26.81);Muscle weakness (generalized) (M62.81);Feeding difficulties (R63.3)   Activity Tolerance Patient tolerated treatment  well   Patient Left in bed;with call bell/phone within reach;with bed alarm set;with nursing/sitter in room   Nurse Communication Mobility status        Time: 1278-7183 OT Time Calculation (min): 17 min  Charges: OT General Charges $OT Visit: 1 Visit OT Treatments $Self Care/Home Management : 8-22 mins  [facility] MS, OTR/L ascom [phone]  01/28/22, 3:55 PM

## 2022-01-28 NOTE — Progress Notes (Signed)
PT Cancellation Note  Patient Details Name: Nyquan Selbe MRN: [id number] DOB: 1960/10/10   Cancelled Treatment:    Reason Eval/Treat Not Completed: Patient at procedure or test/unavailable  Lieutenant Diego PT, DPT 9:42 AM,01/28/22

## 2022-01-28 NOTE — Progress Notes (Signed)
[facility] Progress Note  Patient: Todd Brown    DTO:[id number]  DOA: 12/15/2021    Date of Service: the patient was seen and examined on 01/28/2022  Brief hospital course: 61 year old male with past medical history of hypertension and end-stage renal disease on hemodialysis presented to the emergency room from his group home/Assisted living facility on 7/17 with confusion.  (Patient's baseline was that he is unable to care for himself and has a legal guardian.)  Patient last received dialysis on Friday, 7/14.    Patient found to have markedly elevated blood pressure with a systolic in the 983J requiring Cardene drip and placement of patient to the ICU.  Patient with questionable cardiac ischemia as well.  CT scan of head and neurological exam unremarkable.    Patient was admitted to the hospitalist service and started on his oral antihypertensives, nicardipine drip and given IV labetalol and as needed IV hydralazine.  Nephrology consulted and resumed patient's dialysis.  Over the next few weeks, blood pressure better controlled.    At times became agitated and seen by psychiatry and started on scheduled and as needed Haldol, leading to more compliance with better mentation as well as taking his antihypertensives.    Hospital course complicated by episode of periorbital swelling bilaterally felt to be secondary to subcutaneous emphysema, resolved on steroids.  Also had episode of oral thrush treated with Magic mouthwash.    Patient's assisted living facility willing to take patient back after becomes much stronger at skilled nursing facility.  Skilled nursing facility search still in progress, apparently with no bed offers thus far.  Assessment and Plan: * Acute metabolic encephalopathy-resolved as of 12/31/2021 Felt to be secondary to malignant hypertension.  Resolved and now back at baseline.  Had some increased somnolence which improved after stopping his scheduled Benadryl and  Haldol.  Hyperkalemia Potassiums have been trending upward for the past few days and patient started on Veltassa.  After dialysis on 8/28, potassium on 8/29 at 4.5.  Continue to follow.  Hypertensive emergency-resolved as of 12/31/2021 Resolved.  See below.  ESRD on dialysis [facility]) Nephrology consulted, full dialysis session following admission and again today to keep him on his Monday Wednesday Friday schedule. -Continue scheduled dialysis  Mental health disorder Unspecified.  Patient is unable to care for himself.  It is unclear if he is ever had a formal diagnosis.  He is on Tegretol and Artane which he should continue.  As needed Haldol for acute behavior agitation as above.  He is for the most part much more calm and cooperative these days over times, he intermittently refused vitals being checked or taking his medications.  Essential hypertension Resumed on his home p.o. amlodipine 10 mg daily, clonidine 0.3 mg p.o. 3 times daily, hydralazine 100 mg p.o. 3 times daily, isosorbide mononitrate 60 mg p.o. twice daily, losartan 100 mg daily.  Pressures at time improved, but then blood pressure starts to spike again.  As above, changing ARB and adding alpha-blocker. -Nephrology added clonidine patches with p.o. clonidine. -Minoxidil was then added and titrated up to 10 mg daily.  During the first week of August, patient occasionally had episodes of lower blood pressure without any blood pressure spikes, so some of his newer medications were decreased: hydralazine and minoxidil was scaled back.  With blood pressure on the rise again, have increased minoxidil back to 10 mg and hydralazine up to 10 mg 3 times daily.  Pressures softer after dialysis and started to trend back upward  the day before the next dialysis session.  Oral thrush 8/23: start nystatin suspension  Subcutaneous emphysema (HCC)-resolved as of 12/18/2021 On steroids.  Appreciate pulmonary assistance.  Subcutaneous emphysema looks  to be resolved.  Changed from Solu-Medrol to p.o. prednisone quick taper.  Physical deconditioning PT OT are recommending SNF.  However, this is similar to a previous hospitalization where TOC was unable to find skilled nursing and over time, patient's strength improved that he was able to be discharged back to his assisted living facility.  Anemia of chronic disease Hbg stable. Check CBC ~weekly.  Malnutrition of moderate degree Nutrition Status: Nutrition Problem: Moderate Malnutrition Etiology: chronic illness (ESRD on HD) Signs/Symptoms: mild fat depletion, mild muscle depletion, moderate muscle depletion Interventions: Refer to RD note for recommendations, Nepro shake, MVI, Prostat       Body mass index is 27.18 kg/m.  Nutrition Problem: Moderate Malnutrition Etiology: chronic illness (ESRD on HD)     Consultants: Nephrology Critical care Psychiatry  Procedures: Hemodialysis   Antimicrobials: None  Code Status: Full code   Subjective: Patient has no complaints today.  Objective: Vitals:   01/28/22 1325 01/28/22 1543  BP: (!) 153/68 (!) 156/75  Pulse: (!) 55 (!) 59  Resp: 16 17  Temp: 97.8 F (36.6 C) 98 F (36.7 C)  SpO2: 100% 100%   Filed Weights   01/26/22 0830 01/26/22 1213 01/28/22 0850  Weight: 95.3 kg 93.3 kg 90.9 kg   Body mass index is 27.18 kg/m.  Exam: General exam: Oriented x3, no acute distress Respiratory system: Clear to auscultation bilaterally Cardiovascular system: Regular rate and rhythm, S1-S2 Gastrointestinal system: soft, nondistended, nontender, hypoactive bowel sounds Extremity: No clubbing or cyanosis, trace pitting edema Psychiatry: Withdrawn, but does not appear to be in acute psychosis   Data Reviewed: No labs today   Disposition:  Status is: Inpatient Remains inpatient appropriate because:  --Awaiting SNF placement  Anticipated discharge date: Unknown  Family Communication: None   DVT  Prophylaxis: heparin injection 5,000 Units Start: 12/15/21 1645 Place TED hose Start: 12/15/21 1629    Author: Annita Brod , MD 01/28/2022 5:50 PM  To reach On-call, see care teams to locate the attending and reach out via [email or web address]. Between 7PM-7AM, please contact night-coverage If you still have difficulty reaching the attending provider, please page the [facility] (Director on Call) for [facility] on amion for assistance.

## 2022-01-28 NOTE — Progress Notes (Signed)
OT Cancellation Note  Patient Details Name: Todd Brown MRN: [id number] DOB: 12/08/1960   Cancelled Treatment:    Reason Eval/Treat Not Completed: Patient at procedure or test/ unavailable. Patient currently off the floor at dialysis. OT to re-attempt as able.   Lanelle Bal  [facility] 01/28/2022, 11:03 AM

## 2022-01-28 NOTE — Progress Notes (Signed)
[facility]  ROUNDING NOTE   Subjective:   Todd Brown is a 61 year old male with past medical history including hypertension, anemia, schizophrenia and end-stage renal disease on hemodialysis.  Patient has presented to the ED with altered mental status.  Patient has been admitted for Altered mental status [R41.82] Hypertensive urgency [I16.0] Severe hypertension [I10] Altered mental status, unspecified altered mental status type [R41.82]  Patient is known to our practice and receives outpatient dialysis treatments at [facility] on a MWF schedule.   Update Patient seen and evaluated during dialysis   HEMODIALYSIS FLOWSHEET:  Blood Flow Rate (mL/min): 400 mL/min Arterial Pressure (mmHg): -160 mmHg Venous Pressure (mmHg): 240 mmHg TMP (mmHg): 10 mmHg Ultrafiltration Rate (mL/min): 1917 mL/min Dialysate Flow Rate (mL/min): 300 ml/min Dialysis Fluid Bolus: Normal Saline Bolus Amount (mL): 300 mL  Resting quietly, tolerating treatment well    Objective:  Vital signs in last 24 hours:  Temp:  [97.7 F (36.5 C)-98.1 F (36.7 C)] 97.9 F (36.6 C) (08/30 0850) Pulse Rate:  [52-65] 52 (08/30 1230) Resp:  [0-20] 13 (08/30 1230) BP: (115-169)/(57-84) 129/72 (08/30 1230) SpO2:  [94 %-100 %] 100 % (08/30 1230) Weight:  [90.9 kg] 90.9 kg (08/30 0850)  Weight change:  Filed Weights   01/26/22 0830 01/26/22 1213 01/28/22 0850  Weight: 95.3 kg 93.3 kg 90.9 kg    Intake/Output: No intake/output data recorded.   Intake/Output this shift:  No intake/output data recorded.  Physical Exam: General: NAD  Head: Normocephalic, atraumatic. Moist oral mucosal membranes  Eyes: Anicteric  Lungs:  Clear to auscultation, normal effort, room air  Heart: Regular rate and rhythm  Abdomen:  Soft, nontender, non distended  Extremities:  1+ peripheral edema.  Neurologic: Alert, answer simple questions  Skin: No lesions  Access: Lt AVF     Basic Metabolic  Panel: Recent Labs  Lab 01/26/22 0820 01/27/22 0513  NA 133* 137  K 5.3* 4.5  CL 95* 98  CO2 27 29  GLUCOSE 80 77  BUN 105* 81*  CREATININE 7.19* 5.72*  CALCIUM 8.9 8.6*  PHOS 5.0*  --      Liver Function Tests: Recent Labs  Lab 01/26/22 0820  ALBUMIN 2.7*     No results for input(s): "LIPASE", "AMYLASE" in the last 168 hours. No results for input(s): "AMMONIA" in the last 168 hours.  CBC: Recent Labs  Lab 01/26/22 0828  WBC 4.6  NEUTROABS 3.0  HGB 9.2*  HCT 28.1*  MCV 103.7*  PLT 196     Cardiac Enzymes: No results for input(s): "CKTOTAL", "CKMB", "CKMBINDEX", "TROPONINI" in the last 168 hours.  BNP: Invalid input(s): "POCBNP"  CBG: No results for input(s): "GLUCAP" in the last 168 hours.   Microbiology: Results for orders placed or performed during the hospital encounter of 12/15/21  SARS Coronavirus 2 by RT PCR (hospital order, performed in [facility] hospital lab) *cepheid single result test* Anterior Nasal Swab     Status: None   Collection Time: 12/15/21  5:50 PM   Specimen: Anterior Nasal Swab  Result Value Ref Range Status   SARS Coronavirus 2 by RT PCR NEGATIVE NEGATIVE Final    Comment: (NOTE) SARS-CoV-2 target nucleic acids are NOT DETECTED.  The SARS-CoV-2 RNA is generally detectable in upper and lower respiratory specimens during the acute phase of infection. The lowest concentration of SARS-CoV-2 viral copies this assay can detect is 250 copies / mL. A negative result does not preclude SARS-CoV-2 infection and should not be used as  the sole basis for treatment or other patient management decisions.  A negative result may occur with improper specimen collection / handling, submission of specimen other than nasopharyngeal swab, presence of viral mutation(s) within the areas targeted by this assay, and inadequate number of viral copies (<250 copies / mL). A negative result must be combined with clinical observations, patient history,  and epidemiological information.  Fact Sheet for Patients:   https://www.patel.info/  Fact Sheet for Healthcare Providers: https://hall.com/  This test is not yet approved or  cleared by the [place] FDA and has been authorized for detection and/or diagnosis of SARS-CoV-2 by FDA under an Emergency Use Authorization (EUA).  This EUA will remain in effect (meaning this test can be used) for the duration of the COVID-19 declaration under Section 564(b)(1) of the Act, 21 U.S.C. section 360bbb-3(b)(1), unless the authorization is terminated or revoked sooner.  Performed at [facility], [address] [place], [place] [address]   MRSA Next Gen by PCR, Nasal     Status: None   Collection Time: 12/15/21  9:03 PM   Specimen: Nasal Mucosa; Nasal Swab  Result Value Ref Range Status   MRSA by PCR Next Gen NOT DETECTED NOT DETECTED Final    Comment: (NOTE) The GeneXpert MRSA Assay (FDA approved for NASAL specimens only), is one component of a comprehensive MRSA colonization surveillance program. It is not intended to diagnose MRSA infection nor to guide or monitor treatment for MRSA infections. Test performance is not FDA approved in patients less than 12 years old. Performed at [facility], [address] [place], [place] [address]     Coagulation Studies: No results for input(s): "LABPROT", "INR" in the last 72 hours.  Urinalysis: No results for input(s): "COLORURINE", "LABSPEC", "PHURINE", "GLUCOSEU", "HGBUR", "BILIRUBINUR", "KETONESUR", "PROTEINUR", "UROBILINOGEN", "NITRITE", "LEUKOCYTESUR" in the last 72 hours.  Invalid input(s): "APPERANCEUR"    Imaging: No results found.   Medications:    anticoagulant sodium citrate      (feeding supplement) PROSource Plus  30 mL Oral TID BM   amLODipine  10 mg Oral q morning   calcium acetate  667 mg Oral TID WC   carbamazepine  1,000 mg Oral q AM    Chlorhexidine Gluconate Cloth  6 each Topical Daily   cloNIDine  0.2 mg Transdermal Weekly   doxazosin  4 mg Oral QHS   epoetin (EPOGEN/PROCRIT) injection  10,000 Units Intravenous Q M,W,F-HD   ferrous sulfate  325 mg Oral Q breakfast   fluPHENAZine  2.5 mg Oral q AM   heparin  5,000 Units Subcutaneous Q8H   heparin sodium (porcine)       hydrALAZINE  25 mg Oral TID   irbesartan  300 mg Oral Daily   isosorbide mononitrate  60 mg Oral BID   minoxidil  10 mg Oral Daily   mometasone-formoterol  2 puff Inhalation BID   multivitamin  1 tablet Oral QHS   nystatin  5 mL Oral QID   mouth rinse  15 mL Mouth Rinse 4 times per day   pantoprazole  40 mg Oral Daily   patiromer  16.8 g Oral Daily   polyethylene glycol  17 g Oral BID   trihexyphenidyl  2 mg Oral BID WC   albuterol, alteplase, anticoagulant sodium citrate, diphenhydrAMINE, haloperidol lactate, heparin, heparin sodium (porcine), hydrALAZINE, lidocaine (PF), lidocaine-prilocaine, ondansetron **OR** ondansetron (ZOFRAN) IV, mouth rinse, pentafluoroprop-tetrafluoroeth, polyethylene glycol, senna-docusate  Assessment/ Plan:  Todd Brown is a 61 y.o.  male  with past medical history including hypertension, anemia, schizophrenia and end-stage renal disease on hemodialysis.  Patient has presented to the ED with altered mental status.  Patient has been admitted for Altered mental status [R41.82] Hypertensive urgency [I16.0] Severe hypertension [I10] Altered mental status, unspecified altered mental status type [R41.82]  [facility] [place]/MWF/Lt AVF  End-stage renal disease with hyperkalemia on hemodialysis.  Will maintain outpatient schedule if possible.   Receiving dialysis, UF goal 3 L as tolerated.  Next treatment scheduled for Friday.  Potassium remains stable  Continuing to monitor discharge plan to include rehab.  Due to prolonged hospitalization, outpatient clinic will need to be reestablished.  2. Anemia of chronic kidney  disease Lab Results  Component Value Date   HGB 9.2 (L) 01/26/2022  Hemoglobin within acceptable target. Continue EPO with dialysis for now.   3. Secondary Hyperparathyroidism:  Lab Results  Component Value Date   CALCIUM 8.6 (L) 01/27/2022   PHOS 5.0 (H) 01/26/2022  Calcium and phosphorus at goal Continue calcium acetate with meals.  4.  Hypertension with chronic kidney disease.  Home regimen includes amlodipine, clonidine, hydralazine, isosorbide, and losartan.    Patient is currently on amlodipine 10 mg p.o. daily Hydralazine 25 mg p.o. 3 times daily Irbesartan 300 mg p.o. daily Isosorbide 60 mg p.o. twice daily Doxazosin 4 mg p.o. daily Minoxidil 10 mg p.o. daily  Continue current regimen.  Blood pressure currently 129/72.   LOS: [place] 8/30/202312:40 PM

## 2022-01-28 NOTE — Progress Notes (Signed)
HD ran for 3.5 hrs PT tolerated TX well no signs of distress  UF = 2500   Vs: 132/67 Hr 55  O2sat 99 Temp 98

## 2022-01-29 IMAGING — CT CT HEAD W/O CM
3 series · 16 of 47 positions shown, 19 images · non-contrast
Comparison: 04/30/2017

CLINICAL DATA: Ataxia.  Dementia.  Question stroke.

EXAM:
CT HEAD WITHOUT CONTRAST
TECHNIQUE: Contiguous axial images were obtained from the base of the skull
through the vertex without intravenous contrast.

[Series 2: head wo · axial · 0.47mm/px · z∈[-126,-1]mm · 10 of 30 slices shown, 13 images]
[im 3/30  brain]
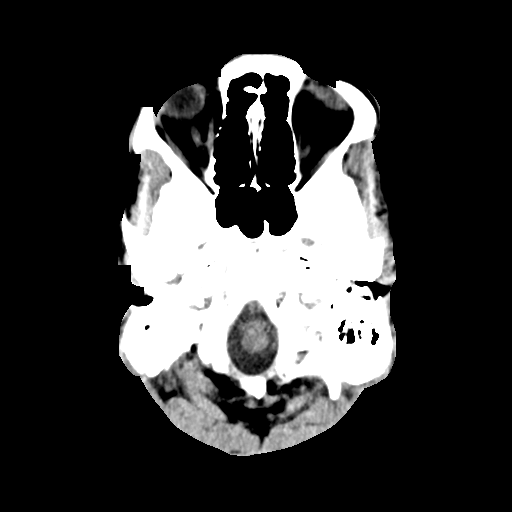
[im 3/30  bone]
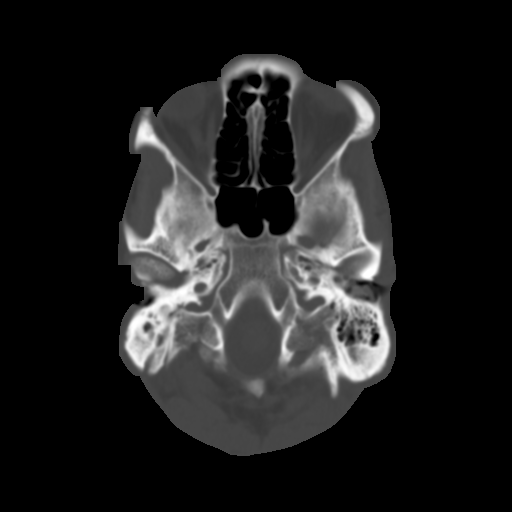
[im 6/30  brain]
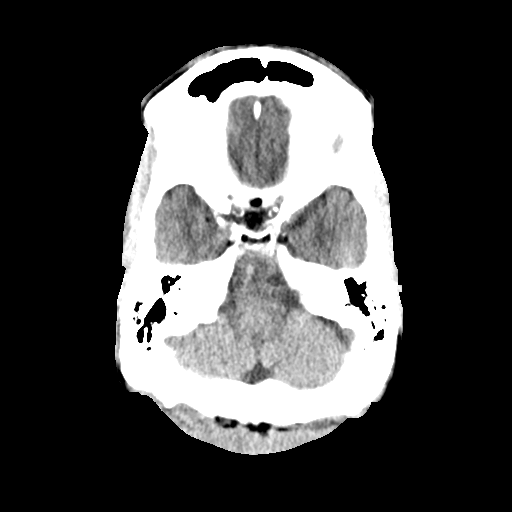
[im 9/30  brain]
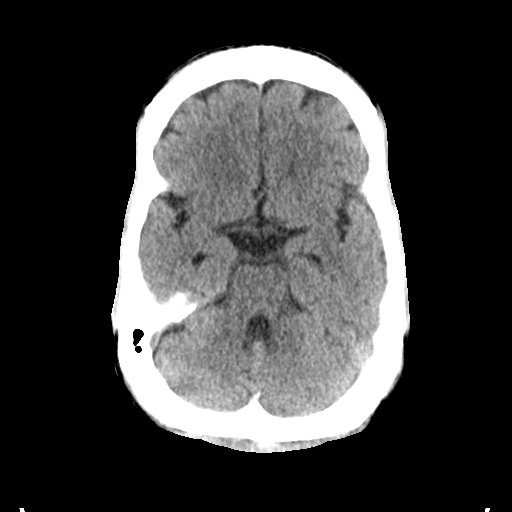
[im 11/30  brain]
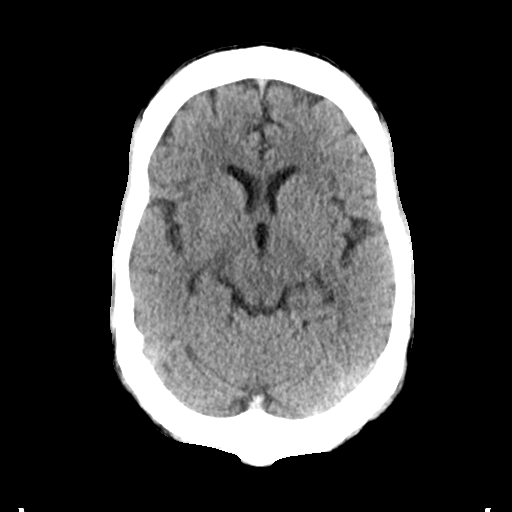
[im 14/30  brain]
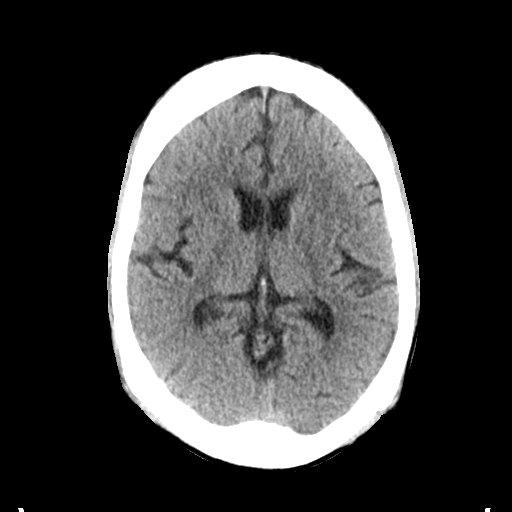
[im 14/30  bone]
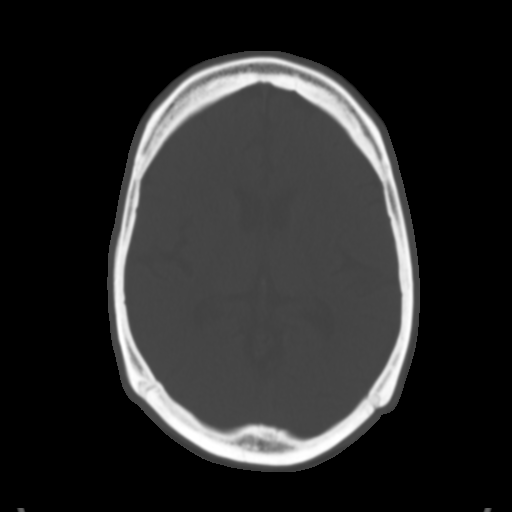
[im 17/30  brain]
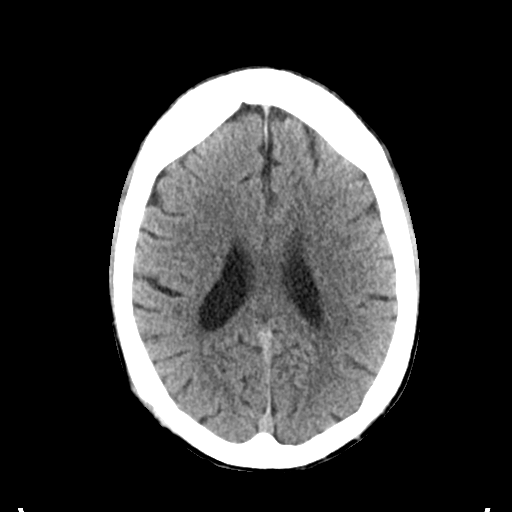
[im 20/30  brain]
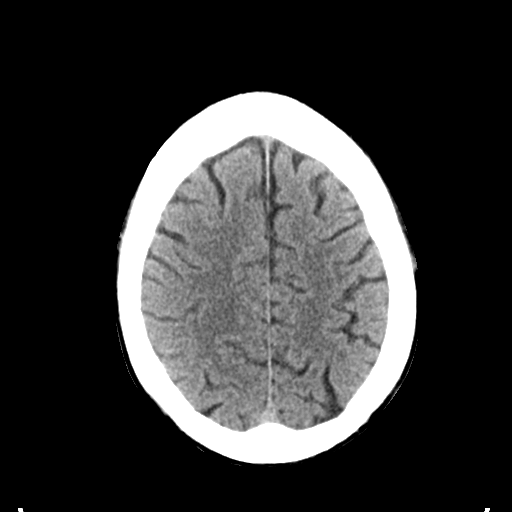
[im 23/30  brain]
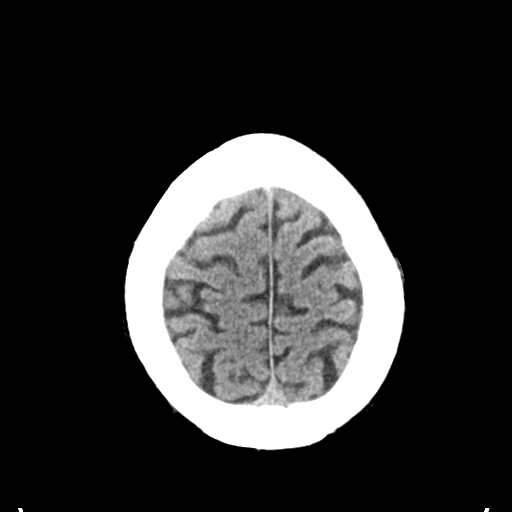
[im 25/30  brain]
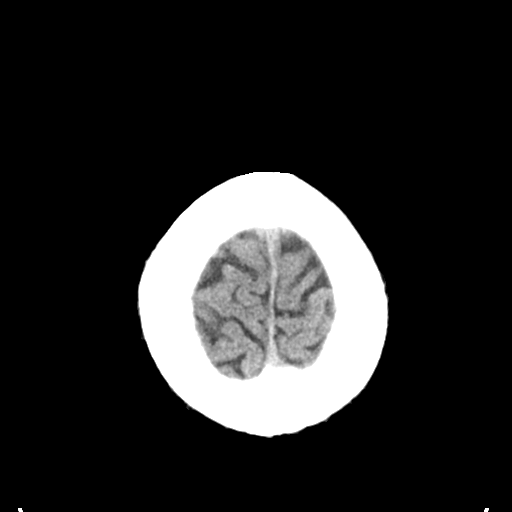
[im 25/30  bone]
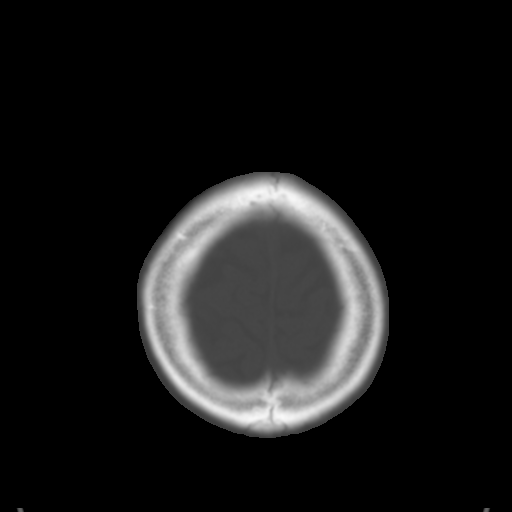
[im 28/30  brain]
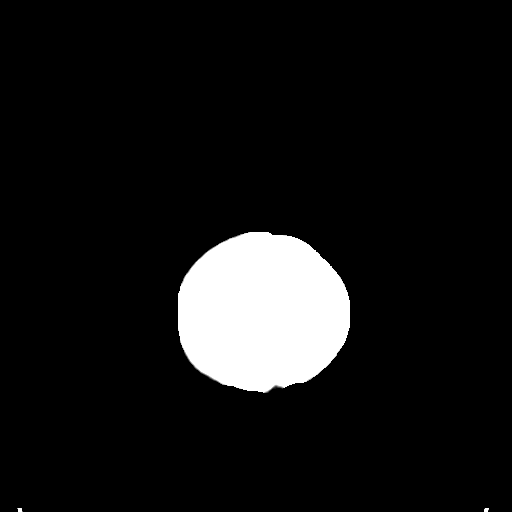

[Series 4: coronal soft tissue · coronal · 0.31mm/px · 3 of 67 slices shown]
[im 23/67  brain]
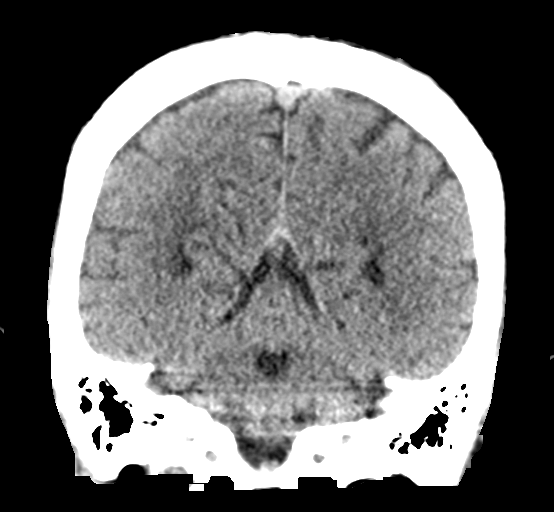
[im 30/67  brain]
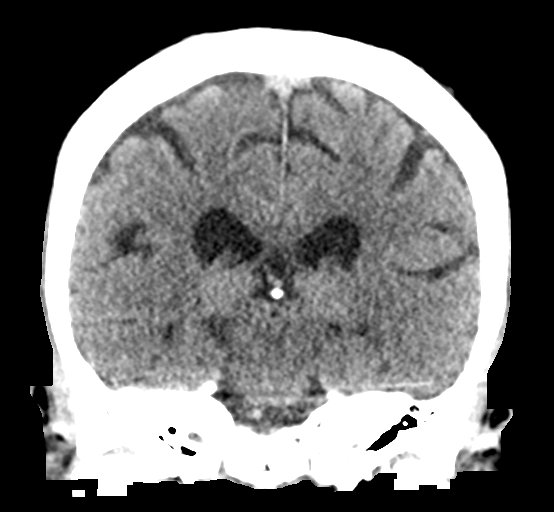
[im 37/67  brain]
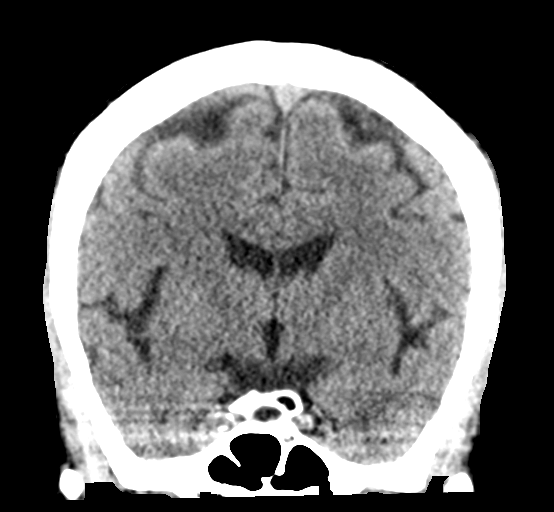

[Series 5: sagittal soft tissue · sagittal · 0.31mm/px · 3 of 53 slices shown]
[im 18/53  brain]
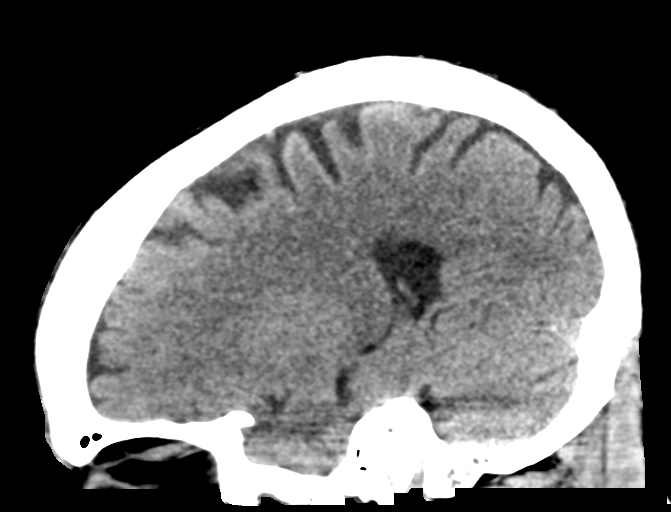
[im 27/53  brain]
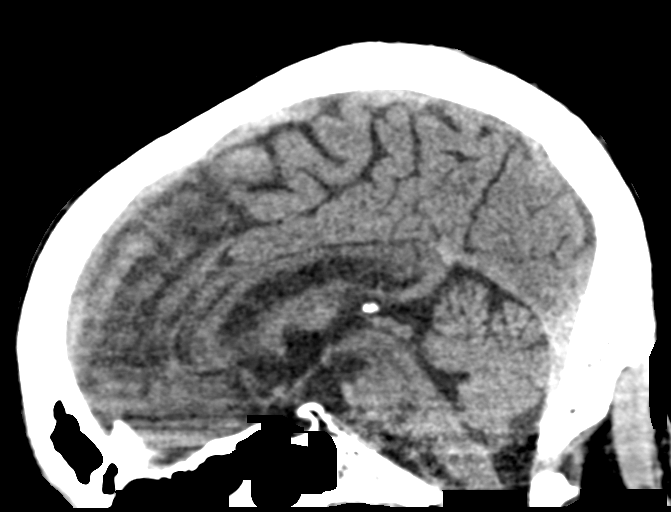
[im 35/53  brain]
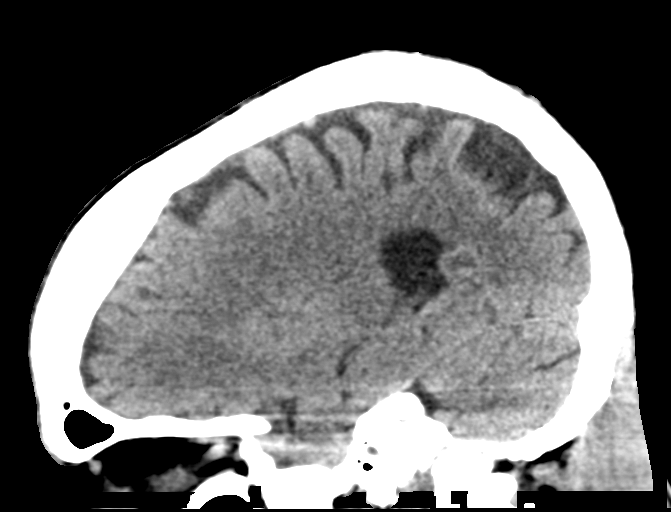

[16 of 47 positions shown; findings below may reference images not displayed]

FINDINGS: Brain: Brain atrophy as seen previously. No sign of old or acute
focal infarction, mass lesion, hemorrhage, hydrocephalus or
extra-axial collection.

Vascular: There is atherosclerotic calcification of the major
vessels at the base of the brain.

Skull: Negative

Sinuses/Orbits: Clear/normal

Other: None
IMPRESSION: No acute or reversible finding.  Brain atrophy.

## 2022-01-29 NOTE — TOC Progression Note (Signed)
Transition of Care [facility]) - Progression Note    Patient Details  Name: Todd Brown MRN: [id number] Date of Birth: 09/21/1960  Transition of Care [facility]) CM/SW Contact  Laurena Slimmer, RN Phone Number: 01/29/2022, 9:10 AM  Clinical Narrative:    Calls placed to admissions at [facility].  Spoke with Demetrice. Referral still in intake.   Call placed to [facility]. Spoke with Altha Harm in admissions. Requested referral be resent.    Expected Discharge Plan: [place] Barriers to Discharge: Continued Medical Work up  Expected Discharge Plan and Services Expected Discharge Plan: [place] arrangements for the past 2 months: [place] Expected Discharge Date: 12/19/21                                     Social Determinants of Health (SDOH) Interventions    Readmission Risk Interventions     No data to display

## 2022-01-29 NOTE — Progress Notes (Signed)
[facility] Progress Note  Patient: Todd Brown    HWE:[id number]  DOA: 12/15/2021    Date of Service: the patient was seen and examined on 01/29/2022  Brief hospital course: 61 year old male with past medical history of hypertension and end-stage renal disease on hemodialysis presented to the emergency room from his group home/Assisted living facility on 7/17 with confusion.  (Patient's baseline was that he is unable to care for himself and has a legal guardian.)  Patient last received dialysis on Friday, 7/14.    Patient found to have markedly elevated blood pressure with a systolic in the 893Y requiring Cardene drip and placement of patient to the ICU.  Patient with questionable cardiac ischemia as well.  CT scan of head and neurological exam unremarkable.    Patient was admitted to the hospitalist service and started on his oral antihypertensives, nicardipine drip and given IV labetalol and as needed IV hydralazine.  Nephrology consulted and resumed patient's dialysis.  Over the next few weeks, blood pressure better controlled.    At times became agitated and seen by psychiatry and started on scheduled and as needed Haldol, leading to more compliance with better mentation as well as taking his antihypertensives.    Hospital course complicated by episode of periorbital swelling bilaterally felt to be secondary to subcutaneous emphysema, resolved on steroids.  Also had episode of oral thrush treated with Magic mouthwash.    Patient's assisted living facility willing to take patient back after becomes much stronger at skilled nursing facility.  Skilled nursing facility search still in progress, apparently with no bed offers thus far.  Assessment and Plan: * Acute metabolic encephalopathy-resolved as of 12/31/2021 Felt to be secondary to malignant hypertension.  Resolved and now back at baseline.  Had some increased somnolence which improved after stopping his scheduled Benadryl and  Haldol.  Hyperkalemia Potassiums have been trending upward for the past few days and patient started on Veltassa.  After dialysis on 8/28, potassium on 8/29 at 4.5.  Continue to follow.  Hypertensive emergency-resolved as of 12/31/2021 Resolved.  See below.  ESRD on dialysis [facility]) Nephrology consulted, full dialysis session following admission and again today to keep him on his Monday Wednesday Friday schedule. -Continue scheduled dialysis  Mental health disorder Unspecified.  Patient is unable to care for himself.  It is unclear if he is ever had a formal diagnosis.  He is on Tegretol and Artane which he should continue.  As needed Haldol for acute behavior agitation as above.  He is for the most part much more calm and cooperative these days over times, he intermittently refused vitals being checked or taking his medications.  Essential hypertension Resumed on his home p.o. amlodipine 10 mg daily, clonidine 0.3 mg p.o. 3 times daily, hydralazine 100 mg p.o. 3 times daily, isosorbide mononitrate 60 mg p.o. twice daily, losartan 100 mg daily.  Pressures at time improved, but then blood pressure starts to spike again.  As above, changing ARB and adding alpha-blocker. -Nephrology added clonidine patches with p.o. clonidine. -Minoxidil was then added and titrated up to 10 mg daily.  During the first week of August, patient occasionally had episodes of lower blood pressure without any blood pressure spikes, so some of his newer medications were decreased: hydralazine and minoxidil was scaled back.  With blood pressure on the rise again, have increased minoxidil back to 10 mg and hydralazine up to 10 mg 3 times daily.  Pressures softer after dialysis and started to trend back upward  the day before the next dialysis session.  Oral thrush 8/23: start nystatin suspension  Subcutaneous emphysema (HCC)-resolved as of 12/18/2021 On steroids.  Appreciate pulmonary assistance.  Subcutaneous emphysema looks  to be resolved.  Changed from Solu-Medrol to p.o. prednisone quick taper.  Physical deconditioning PT OT are recommending SNF.  However, this is similar to a previous hospitalization where TOC was unable to find skilled nursing and over time, patient's strength improved that he was able to be discharged back to his assisted living facility.  Anemia of chronic disease Hbg stable. Check CBC ~weekly.  Malnutrition of moderate degree Nutrition Status: Nutrition Problem: Moderate Malnutrition Etiology: chronic illness (ESRD on HD) Signs/Symptoms: mild fat depletion, mild muscle depletion, moderate muscle depletion Interventions: Refer to RD note for recommendations, Nepro shake, MVI, Prostat       Body mass index is 27.18 kg/m.  Nutrition Problem: Moderate Malnutrition Etiology: chronic illness (ESRD on HD)     Consultants: Nephrology Critical care Psychiatry  Procedures: Hemodialysis   Antimicrobials: None  Code Status: Full code   Subjective: About the same, no complaints  Objective: Vitals:   01/29/22 0341 01/29/22 0727  BP: 113/70 117/62  Pulse: 68 69  Resp: 17 20  Temp: 98.9 F (37.2 C) 98 F (36.7 C)  SpO2: 99% 100%   Filed Weights   01/26/22 0830 01/26/22 1213 01/28/22 0850  Weight: 95.3 kg 93.3 kg 90.9 kg   Body mass index is 27.18 kg/m.  Exam: General exam: Oriented x3, no acute distress Respiratory system: Clear to auscultation bilaterally Cardiovascular system: Regular rate and rhythm, S1-S2 Gastrointestinal system: soft, nondistended, nontender, hypoactive bowel sounds Extremity: No clubbing or cyanosis, trace pitting edema Psychiatry: Withdrawn, but does not appear to be in acute psychosis   Data Reviewed: No labs today   Disposition:  Status is: Inpatient Remains inpatient appropriate because:  --Awaiting SNF placement  Anticipated discharge date: Unknown  Family Communication: None   DVT Prophylaxis: heparin injection  5,000 Units Start: 12/15/21 1645 Place TED hose Start: 12/15/21 1629    Author: Annita Brod , MD 01/29/2022 1:12 PM  To reach On-call, see care teams to locate the attending and reach out via [email or web address]. Between 7PM-7AM, please contact night-coverage If you still have difficulty reaching the attending provider, please page the [facility] (Director on Call) for [facility] on amion for assistance.

## 2022-01-29 NOTE — Progress Notes (Signed)
[facility]  ROUNDING NOTE   Subjective:   Todd Brown is a 61 year old male with past medical history including hypertension, anemia, schizophrenia and end-stage renal disease on hemodialysis.  Patient has presented to the ED with altered mental status.  Patient has been admitted for Altered mental status [R41.82] Hypertensive urgency [I16.0] Severe hypertension [I10] Altered mental status, unspecified altered mental status type [R41.82]  Patient is known to our practice and receives outpatient dialysis treatments at [facility] on a MWF schedule.   Update Patient seen sitting up in chair Awaiting assistance with breakfast tray No complaints at this time   Objective:  Vital signs in last 24 hours:  Temp:  [97.8 F (36.6 C)-98.9 F (37.2 C)] 98 F (36.7 C) (08/31 0727) Pulse Rate:  [52-76] 69 (08/31 0727) Resp:  [0-20] 20 (08/31 0727) BP: (113-156)/(62-84) 117/62 (08/31 0727) SpO2:  [96 %-100 %] 100 % (08/31 0727)  Weight change:  Filed Weights   01/26/22 0830 01/26/22 1213 01/28/22 0850  Weight: 95.3 kg 93.3 kg 90.9 kg    Intake/Output: I/O last 3 completed shifts: In: -  Out: 2500 [Other:2500]   Intake/Output this shift:  Total I/O In: 100 [P.O.:100] Out: -   Physical Exam: General: NAD  Head: Normocephalic, atraumatic. Moist oral mucosal membranes  Eyes: Anicteric  Lungs:  Clear to auscultation, normal effort, room air  Heart: Regular rate and rhythm  Abdomen:  Soft, nontender, non distended  Extremities:  1+ peripheral edema.  Neurologic: Alert, answer simple questions  Skin: No lesions  Access: Lt AVF     Basic Metabolic Panel: Recent Labs  Lab 01/26/22 0820 01/27/22 0513  NA 133* 137  K 5.3* 4.5  CL 95* 98  CO2 27 29  GLUCOSE 80 77  BUN 105* 81*  CREATININE 7.19* 5.72*  CALCIUM 8.9 8.6*  PHOS 5.0*  --      Liver Function Tests: Recent Labs  Lab 01/26/22 0820  ALBUMIN 2.7*     No results for input(s):  "LIPASE", "AMYLASE" in the last 168 hours. No results for input(s): "AMMONIA" in the last 168 hours.  CBC: Recent Labs  Lab 01/26/22 0828  WBC 4.6  NEUTROABS 3.0  HGB 9.2*  HCT 28.1*  MCV 103.7*  PLT 196     Cardiac Enzymes: No results for input(s): "CKTOTAL", "CKMB", "CKMBINDEX", "TROPONINI" in the last 168 hours.  BNP: Invalid input(s): "POCBNP"  CBG: No results for input(s): "GLUCAP" in the last 168 hours.   Microbiology: Results for orders placed or performed during the hospital encounter of 12/15/21  SARS Coronavirus 2 by RT PCR (hospital order, performed in [facility] hospital lab) *cepheid single result test* Anterior Nasal Swab     Status: None   Collection Time: 12/15/21  5:50 PM   Specimen: Anterior Nasal Swab  Result Value Ref Range Status   SARS Coronavirus 2 by RT PCR NEGATIVE NEGATIVE Final    Comment: (NOTE) SARS-CoV-2 target nucleic acids are NOT DETECTED.  The SARS-CoV-2 RNA is generally detectable in upper and lower respiratory specimens during the acute phase of infection. The lowest concentration of SARS-CoV-2 viral copies this assay can detect is 250 copies / mL. A negative result does not preclude SARS-CoV-2 infection and should not be used as the sole basis for treatment or other patient management decisions.  A negative result may occur with improper specimen collection / handling, submission of specimen other than nasopharyngeal swab, presence of viral mutation(s) within the areas targeted by this assay,  and inadequate number of viral copies (<250 copies / mL). A negative result must be combined with clinical observations, patient history, and epidemiological information.  Fact Sheet for Patients:   https://www.patel.info/  Fact Sheet for Healthcare Providers: https://hall.com/  This test is not yet approved or  cleared by the [place] FDA and has been authorized for detection and/or  diagnosis of SARS-CoV-2 by FDA under an Emergency Use Authorization (EUA).  This EUA will remain in effect (meaning this test can be used) for the duration of the COVID-19 declaration under Section 564(b)(1) of the Act, 21 U.S.C. section 360bbb-3(b)(1), unless the authorization is terminated or revoked sooner.  Performed at [facility], [address] [place], [place] [address]   MRSA Next Gen by PCR, Nasal     Status: None   Collection Time: 12/15/21  9:03 PM   Specimen: Nasal Mucosa; Nasal Swab  Result Value Ref Range Status   MRSA by PCR Next Gen NOT DETECTED NOT DETECTED Final    Comment: (NOTE) The GeneXpert MRSA Assay (FDA approved for NASAL specimens only), is one component of a comprehensive MRSA colonization surveillance program. It is not intended to diagnose MRSA infection nor to guide or monitor treatment for MRSA infections. Test performance is not FDA approved in patients less than 24 years old. Performed at [facility], [address] [place], [place] [address]     Coagulation Studies: No results for input(s): "LABPROT", "INR" in the last 72 hours.  Urinalysis: No results for input(s): "COLORURINE", "LABSPEC", "PHURINE", "GLUCOSEU", "HGBUR", "BILIRUBINUR", "KETONESUR", "PROTEINUR", "UROBILINOGEN", "NITRITE", "LEUKOCYTESUR" in the last 72 hours.  Invalid input(s): "APPERANCEUR"    Imaging: No results found.   Medications:    anticoagulant sodium citrate      (feeding supplement) PROSource Plus  30 mL Oral TID BM   amLODipine  10 mg Oral q morning   calcium acetate  667 mg Oral TID WC   carbamazepine  1,000 mg Oral q AM   Chlorhexidine Gluconate Cloth  6 each Topical Daily   cloNIDine  0.2 mg Transdermal Weekly   doxazosin  4 mg Oral QHS   epoetin (EPOGEN/PROCRIT) injection  10,000 Units Intravenous Q M,W,F-HD   ferrous sulfate  325 mg Oral Q breakfast   fluPHENAZine  2.5 mg Oral q AM   heparin  5,000 Units Subcutaneous Q8H    hydrALAZINE  25 mg Oral TID   irbesartan  300 mg Oral Daily   isosorbide mononitrate  60 mg Oral BID   minoxidil  10 mg Oral Daily   mometasone-formoterol  2 puff Inhalation BID   multivitamin  1 tablet Oral QHS   nystatin  5 mL Oral QID   mouth rinse  15 mL Mouth Rinse 4 times per day   pantoprazole  40 mg Oral Daily   patiromer  16.8 g Oral Daily   polyethylene glycol  17 g Oral BID   trihexyphenidyl  2 mg Oral BID WC   albuterol, alteplase, anticoagulant sodium citrate, diphenhydrAMINE, haloperidol lactate, heparin, hydrALAZINE, lidocaine (PF), lidocaine-prilocaine, ondansetron **OR** ondansetron (ZOFRAN) IV, mouth rinse, pentafluoroprop-tetrafluoroeth, polyethylene glycol, senna-docusate  Assessment/ Plan:  Todd Brown is a 61 y.o.  male with past medical history including hypertension, anemia, schizophrenia and end-stage renal disease on hemodialysis.  Patient has presented to the ED with altered mental status.  Patient has been admitted for Altered mental status [R41.82] Hypertensive urgency [I16.0] Severe hypertension [I10] Altered mental status, unspecified altered mental status type [R41.82]  [facility]  [facility]/MWF/Lt AVF  End-stage renal disease with hyperkalemia on hemodialysis.  Will maintain outpatient schedule if possible.   Dialysis received yesterday, UF 2.5L achieved. Next treatment scheduled for Friday. Continuing to monitor discharge plan to include rehab placement. Patient will need outpatient dialysis clinic acceptance prior to discharge.   2. Anemia of chronic kidney disease Lab Results  Component Value Date   HGB 9.2 (L) 01/26/2022  Hemoglobin within acceptable target. Continue EPO with dialysis    3. Secondary Hyperparathyroidism:  Lab Results  Component Value Date   CALCIUM 8.6 (L) 01/27/2022   PHOS 5.0 (H) 01/26/2022   Continue calcium acetate with meals.  4.  Hypertension with chronic kidney disease.  Home regimen includes amlodipine,  clonidine, hydralazine, isosorbide, and losartan.    Patient is currently on amlodipine 10 mg p.o. daily Hydralazine 25 mg p.o. 3 times daily Irbesartan 300 mg p.o. daily Isosorbide 60 mg p.o. twice daily Doxazosin 4 mg p.o. daily Minoxidil 10 mg p.o. daily  Continue current regimen.  Blood pressure stable   LOS: [place] 8/31/202311:26 AM

## 2022-01-30 DIAGNOSIS — E440 Moderate protein-calorie malnutrition: Secondary | ICD-10-CM | POA: Diagnosis not present

## 2022-01-30 DIAGNOSIS — G9341 Metabolic encephalopathy: Secondary | ICD-10-CM | POA: Diagnosis not present

## 2022-01-30 DIAGNOSIS — E875 Hyperkalemia: Secondary | ICD-10-CM | POA: Diagnosis not present

## 2022-01-30 DIAGNOSIS — N186 End stage renal disease: Secondary | ICD-10-CM | POA: Diagnosis not present

## 2022-01-30 LAB — BASIC METABOLIC PANEL
Anion gap: 11 (ref 5–15)
BUN: 99 mg/dL — ABNORMAL HIGH (ref 8–23)
CO2: 27 mmol/L (ref 22–32)
Calcium: 9.3 mg/dL (ref 8.9–10.3)
Chloride: 97 mmol/L — ABNORMAL LOW (ref 98–111)
Creatinine, Ser: 6.44 mg/dL — ABNORMAL HIGH (ref 0.61–1.24)
GFR, Estimated: 9 mL/min — ABNORMAL LOW (ref >60)
Glucose, Bld: 120 mg/dL — ABNORMAL HIGH (ref 70–99)
Potassium: 4.8 mmol/L (ref 3.5–5.1)
Sodium: 135 mmol/L (ref 135–145)

## 2022-01-30 MED ORDER — EPOETIN ALFA 10000 UNIT/ML IJ SOLN
INTRAMUSCULAR | Status: AC
  Administered 2022-01-30: 10000 [IU] via INTRAVENOUS
  Filled 2022-01-30: qty 1

## 2022-01-30 NOTE — Progress Notes (Signed)
PT Cancellation Note  Patient Details Name: Todd Brown MRN: [id number] DOB: August 13, 1960   Cancelled Treatment:    Reason Eval/Treat Not Completed: Other (comment). Pt currently being cleaned up from breakfast and being prepared to go to HD, PT to re-attempt as able.    Lieutenant Diego PT, DPT 8:55 AM,01/30/22

## 2022-01-30 NOTE — Progress Notes (Signed)
[facility] Progress Note  Patient: Todd Brown    HQI:[id number]  DOA: 12/15/2021    Date of Service: the patient was seen and examined on 01/30/2022  Brief hospital course: 61 year old male with past medical history of hypertension and end-stage renal disease on hemodialysis presented to the emergency room from his group home/Assisted living facility on 7/17 with confusion.  (Patient's baseline was that he is unable to care for himself and has a legal guardian.)  Patient last received dialysis on Friday, 7/14.    Patient found to have markedly elevated blood pressure with a systolic in the 132G requiring Cardene drip and placement of patient to the ICU.  Patient with questionable cardiac ischemia as well.  CT scan of head and neurological exam unremarkable.    Patient was admitted to the hospitalist service and started on his oral antihypertensives, nicardipine drip and given IV labetalol and as needed IV hydralazine.  Nephrology consulted and resumed patient's dialysis.  Over the next few weeks, blood pressure better controlled.    At times became agitated and seen by psychiatry and started on scheduled and as needed Haldol, leading to more compliance with better mentation as well as taking his antihypertensives.    Hospital course complicated by episode of periorbital swelling bilaterally felt to be secondary to subcutaneous emphysema, resolved on steroids.  Also had episode of oral thrush treated with Magic mouthwash.    Patient's assisted living facility willing to take patient back after becomes much stronger at skilled nursing facility.  Skilled nursing facility search still in progress, apparently with no bed offers thus far.  Assessment and Plan: * Acute metabolic encephalopathy-resolved as of 12/31/2021 Felt to be secondary to malignant hypertension.  Resolved and now back at baseline.  Had some increased somnolence which improved after stopping his scheduled Benadryl and  Haldol.  Hyperkalemia Potassiums have been trending upward for the past few days and patient started on Veltassa.  After dialysis on 8/28, potassium on 8/29 at 4.5.  Continue to follow.  Hypertensive emergency-resolved as of 12/31/2021 Resolved.  See below.  ESRD on dialysis [facility]) Nephrology consulted, full dialysis session following admission and again today to keep him on his Monday Wednesday Friday schedule. -Continue scheduled dialysis  Mental health disorder Unspecified.  Patient is unable to care for himself.  It is unclear if he is ever had a formal diagnosis.  He is on Tegretol and Artane which he should continue.  As needed Haldol for acute behavior agitation as above.  He is for the most part much more calm and cooperative these days over times, he intermittently refused vitals being checked or taking his medications.  Patient had a little more agitation on 8/31 night.  Adjusted Haldol as needed.  Essential hypertension Resumed on his home p.o. amlodipine 10 mg daily, clonidine 0.3 mg p.o. 3 times daily, hydralazine 100 mg p.o. 3 times daily, isosorbide mononitrate 60 mg p.o. twice daily, losartan 100 mg daily.  Pressures at time improved, but then blood pressure starts to spike again.  As above, changing ARB and adding alpha-blocker. -Nephrology added clonidine patches with p.o. clonidine. -Minoxidil was then added and titrated up to 10 mg daily.  During the first week of August, patient occasionally had episodes of lower blood pressure without any blood pressure spikes, so some of his newer medications were decreased: hydralazine and minoxidil was scaled back.  With blood pressure on the rise again, have increased minoxidil back to 10 mg and hydralazine up to 10  mg 3 times daily.  Pressures softer after dialysis and started to trend back upward the day before the next dialysis session.  Oral thrush 8/23: start nystatin suspension  Subcutaneous emphysema (HCC)-resolved as of  12/18/2021 On steroids.  Appreciate pulmonary assistance.  Subcutaneous emphysema looks to be resolved.  Changed from Solu-Medrol to p.o. prednisone quick taper.  Physical deconditioning PT OT are recommending SNF.  However, this is similar to a previous hospitalization where TOC was unable to find skilled nursing and over time, patient's strength improved that he was able to be discharged back to his assisted living facility.  Anemia of chronic disease Hbg stable. Check CBC ~weekly.  Malnutrition of moderate degree Nutrition Status: Nutrition Problem: Moderate Malnutrition Etiology: chronic illness (ESRD on HD) Signs/Symptoms: mild fat depletion, mild muscle depletion, moderate muscle depletion Interventions: Refer to RD note for recommendations, Nepro shake, MVI, Prostat       Body mass index is 27.45 kg/m.  Nutrition Problem: Moderate Malnutrition Etiology: chronic illness (ESRD on HD)     Consultants: Nephrology Critical care Psychiatry  Procedures: Hemodialysis   Antimicrobials: None  Code Status: Full code   Subjective: Patient calm, currently in no complaints  Objective: Vitals:   01/30/22 1335 01/30/22 1409  BP: 110/70 108/61  Pulse: 66 69  Resp:  16  Temp:  98.8 F (37.1 C)  SpO2: 100% 99%   Filed Weights   01/28/22 0850 01/30/22 0924 01/30/22 1335  Weight: 90.9 kg 93 kg 91.8 kg   Body mass index is 27.45 kg/m.  Exam: General exam: Oriented x3, no acute distress Respiratory system: Clear to auscultation bilaterally Cardiovascular system: Regular rate and rhythm, S1-S2 Gastrointestinal system: soft, nondistended, nontender, hypoactive bowel sounds Extremity: No clubbing or cyanosis, trace pitting edema Psychiatry: Withdrawn, but does not appear to be in acute psychosis   Data Reviewed: Renal panel consistent with his end-stage renal disease   Disposition:  Status is: Inpatient Remains inpatient appropriate because:  --Awaiting SNF  placement  Anticipated discharge date: Unknown  Family Communication: None   DVT Prophylaxis: heparin injection 5,000 Units Start: 12/15/21 1645 Place TED hose Start: 12/15/21 1629    Author: Annita Brod , MD 01/30/2022 3:49 PM  To reach On-call, see care teams to locate the attending and reach out via [email or web address]. Between 7PM-7AM, please contact night-coverage If you still have difficulty reaching the attending provider, please page the [facility] (Director on Call) for [facility] on amion for assistance.

## 2022-01-30 NOTE — Progress Notes (Signed)
Post HD RN assessment 

## 2022-01-30 NOTE — Progress Notes (Signed)
Pre hd rn assessment 

## 2022-01-30 NOTE — Progress Notes (Signed)
Physical Therapy Treatment Patient Details Name: Todd Brown MRN: [id number] DOB: [birth year] Today's Date: 01/30/2022   History of Present Illness 61 year old male with past medical history of hypertension and end-stage renal disease on hemodialysis presented to the emergency room from his group home/skilled nursing facility on 7/17 with confusion.  (Patient's baseline is that he is unable to care for himself and has a legal guardian.)  Patient last received dialysis on Friday, 7/14.  From being in the emergency room on 7/17, unable to get dialysis yesterday.  Patient found to have markedly elevated blood pressure with a systolic in the 622W.  Patient with questionable cardiac ischemia as well.    PT Comments    Patient alert, did speak more to PT today verbalized his name, did still answer most questions with  "Yeah". With a second assist, pt able to perform modA to sit EOB (able to initiate all mobility today with constant cueing). Sit <> stand with two person handheld assist. He was able to ambulate forwards and then step pivot to recliner in the room, minAx2. He did follow commands enough to perform LAQ x5 bilaterally, but further interventions deferred due to arrival of lunch. The patient would benefit from further skilled PT intervention to continue to progress towards goals. Recommendation remains appropriate.          Recommendations for follow up therapy are one component of a multi-disciplinary discharge planning process, led by the attending physician.  Recommendations may be updated based on patient status, additional functional criteria and insurance authorization.  Follow Up Recommendations  Skilled nursing-short term rehab (<3 hours/day) Can patient physically be transported by private vehicle: No   Assistance Recommended at Discharge Frequent or constant Supervision/Assistance  Patient can return home with the following Two people to help with walking and/or transfers;Two  people to help with bathing/dressing/bathroom;Help with stairs or ramp for entrance;Assist for transportation;Assistance with cooking/housework   Equipment Recommendations  Rolling walker (2 wheels);BSC/3in1;Wheelchair cushion (measurements PT);Wheelchair (measurements PT)    Recommendations for Other Services       Precautions / Restrictions Precautions Precautions: Fall Precaution Comments: vision impaired     Mobility  Bed Mobility Overal bed mobility: Needs Assistance Bed Mobility: Supine to Sit     Supine to sit: Mod assist, +2 for safety/equipment     General bed mobility comments: pt initiated all movements today, modA for trunk assist and weight shift    Transfers Overall transfer level: Needs assistance Equipment used: 2 person hand held assist   Sit to Stand: Mod assist, +2 physical assistance                Ambulation/Gait Ambulation/Gait assistance: Min assist Gait Distance (Feet): 2 Feet Assistive device: 2 person hand held assist         General Gait Details: pt able to step forward with constant cues minAx2 for steadying, and then step towards recliner in room with guidance. visually impaired   Marine scientist [place]in (Stroke Patients Only)       Balance Overall balance assessment: Needs assistance Sitting-balance support: No upper extremity supported, Feet supported Sitting balance-[name]: Fair Sitting balance - Comments: sitting EOB   Standing balance support: Bilateral upper extremity supported, During functional activity Standing balance-[name]: Fair Standing balance comment: 2 person handheld assist throughout standing  Cognition Arousal/Alertness: Awake/alert Behavior During Therapy: Flat affect Overall Cognitive Status: Difficult to assess                                 General Comments: pt is alert however disoriented  and visually impaired.        Exercises General Exercises - Lower Extremity Long Arc Quad: AROM, 5 reps, Both    General Comments        Pertinent Vitals/Pain Pain Assessment Pain Assessment: Faces Faces Pain Scale: No hurt    Home Living                          Prior Function            PT Goals (current goals can now be found in the care plan section) Progress towards PT goals: Progressing toward goals    Frequency    Min 2X/week      PT Plan Current plan remains appropriate    Co-evaluation              AM-PAC PT "6 Clicks" Mobility   Outcome Measure  Help needed turning from your back to your side while in a flat bed without using bedrails?: A Lot Help needed moving from lying on your back to sitting on the side of a flat bed without using bedrails?: A Lot Help needed moving to and from a bed to a chair (including a wheelchair)?: A Lot Help needed standing up from a chair using your arms (e.g., wheelchair or bedside chair)?: A Lot Help needed to walk in hospital room?: A Lot Help needed climbing 3-5 steps with a railing? : Total 6 Click Score: 11    End of Session   Activity Tolerance: Patient tolerated treatment well Patient left: in chair;with chair alarm set;with call bell/phone within reach Nurse Communication: Mobility status PT Visit Diagnosis: Other abnormalities of gait and mobility (R26.89);Muscle weakness (generalized) (M62.81);Unsteadiness on feet (R26.81)     Time: 2094-7096 PT Time Calculation (min) (ACUTE ONLY): 13 min  Charges:  $Therapeutic Activity: 8-22 mins                     Lieutenant Diego PT, DPT 3:01 PM,01/30/22

## 2022-01-30 NOTE — Progress Notes (Signed)
[facility]  ROUNDING NOTE   Subjective:   Todd Brown is a 61 year old male with past medical history including hypertension, anemia, schizophrenia and end-stage renal disease on hemodialysis.  Patient has presented to the ED with altered mental status.  Patient has been admitted for Altered mental status [R41.82] Hypertensive urgency [I16.0] Severe hypertension [I10] Altered mental status, unspecified altered mental status type [R41.82]  Patient is known to our practice and receives outpatient dialysis treatments at [facility] on a MWF schedule.   Update Patient seen and evaluated during dialysis   HEMODIALYSIS FLOWSHEET:  Blood Flow Rate (mL/min): 400 mL/min Arterial Pressure (mmHg): -140 mmHg Venous Pressure (mmHg): 240 mmHg TMP (mmHg): 10 mmHg Ultrafiltration Rate (mL/min): 1114 mL/min Dialysate Flow Rate (mL/min): 30 ml/min Dialysis Fluid Bolus: Normal Saline Bolus Amount (mL): 300 mL  Awaiting breakfast Tolerating treatment well  Objective:  Vital signs in last 24 hours:  Temp:  [97.5 F (36.4 C)-98.4 F (36.9 C)] 98.2 F (36.8 C) (09/01 0924) Pulse Rate:  [63-75] 68 (09/01 1000) Resp:  [11-20] 11 (09/01 1000) BP: (105-140)/(68-82) 111/82 (09/01 1000) SpO2:  [97 %-100 %] 97 % (09/01 1000) Weight:  [93 kg] 93 kg (09/01 0924)  Weight change:  Filed Weights   01/26/22 1213 01/28/22 0850 01/30/22 0924  Weight: 93.3 kg 90.9 kg 93 kg    Intake/Output: I/O last 3 completed shifts: In: 970 [P.O.:970] Out: -    Intake/Output this shift:  No intake/output data recorded.  Physical Exam: General: NAD  Head: Normocephalic, atraumatic. Moist oral mucosal membranes  Eyes: Anicteric  Lungs:  Clear to auscultation, normal effort, room air  Heart: Regular rate and rhythm  Abdomen:  Soft, nontender, non distended  Extremities:  1+ peripheral edema.  Neurologic: Alert, answer simple questions  Skin: No lesions  Access: Lt AVF     Basic  Metabolic Panel: Recent Labs  Lab 01/26/22 0820 01/27/22 0513  NA 133* 137  K 5.3* 4.5  CL 95* 98  CO2 27 29  GLUCOSE 80 77  BUN 105* 81*  CREATININE 7.19* 5.72*  CALCIUM 8.9 8.6*  PHOS 5.0*  --      Liver Function Tests: Recent Labs  Lab 01/26/22 0820  ALBUMIN 2.7*     No results for input(s): "LIPASE", "AMYLASE" in the last 168 hours. No results for input(s): "AMMONIA" in the last 168 hours.  CBC: Recent Labs  Lab 01/26/22 0828  WBC 4.6  NEUTROABS 3.0  HGB 9.2*  HCT 28.1*  MCV 103.7*  PLT 196     Cardiac Enzymes: No results for input(s): "CKTOTAL", "CKMB", "CKMBINDEX", "TROPONINI" in the last 168 hours.  BNP: Invalid input(s): "POCBNP"  CBG: No results for input(s): "GLUCAP" in the last 168 hours.   Microbiology: Results for orders placed or performed during the hospital encounter of 12/15/21  SARS Coronavirus 2 by RT PCR (hospital order, performed in [facility] hospital lab) *cepheid single result test* Anterior Nasal Swab     Status: None   Collection Time: 12/15/21  5:50 PM   Specimen: Anterior Nasal Swab  Result Value Ref Range Status   SARS Coronavirus 2 by RT PCR NEGATIVE NEGATIVE Final    Comment: (NOTE) SARS-CoV-2 target nucleic acids are NOT DETECTED.  The SARS-CoV-2 RNA is generally detectable in upper and lower respiratory specimens during the acute phase of infection. The lowest concentration of SARS-CoV-2 viral copies this assay can detect is 250 copies / mL. A negative result does not preclude SARS-CoV-2 infection and  should not be used as the sole basis for treatment or other patient management decisions.  A negative result may occur with improper specimen collection / handling, submission of specimen other than nasopharyngeal swab, presence of viral mutation(s) within the areas targeted by this assay, and inadequate number of viral copies (<250 copies / mL). A negative result must be combined with clinical observations, patient  history, and epidemiological information.  Fact Sheet for Patients:   https://www.patel.info/  Fact Sheet for Healthcare Providers: https://hall.com/  This test is not yet approved or  cleared by the [place] FDA and has been authorized for detection and/or diagnosis of SARS-CoV-2 by FDA under an Emergency Use Authorization (EUA).  This EUA will remain in effect (meaning this test can be used) for the duration of the COVID-19 declaration under Section 564(b)(1) of the Act, 21 U.S.C. section 360bbb-3(b)(1), unless the authorization is terminated or revoked sooner.  Performed at [facility], [address] [place], [place] [address]   MRSA Next Gen by PCR, Nasal     Status: None   Collection Time: 12/15/21  9:03 PM   Specimen: Nasal Mucosa; Nasal Swab  Result Value Ref Range Status   MRSA by PCR Next Gen NOT DETECTED NOT DETECTED Final    Comment: (NOTE) The GeneXpert MRSA Assay (FDA approved for NASAL specimens only), is one component of a comprehensive MRSA colonization surveillance program. It is not intended to diagnose MRSA infection nor to guide or monitor treatment for MRSA infections. Test performance is not FDA approved in patients less than 49 years old. Performed at [facility], [address] [place], [place] [address]     Coagulation Studies: No results for input(s): "LABPROT", "INR" in the last 72 hours.  Urinalysis: No results for input(s): "COLORURINE", "LABSPEC", "PHURINE", "GLUCOSEU", "HGBUR", "BILIRUBINUR", "KETONESUR", "PROTEINUR", "UROBILINOGEN", "NITRITE", "LEUKOCYTESUR" in the last 72 hours.  Invalid input(s): "APPERANCEUR"    Imaging: No results found.   Medications:    anticoagulant sodium citrate      epoetin alfa       (feeding supplement) PROSource Plus  30 mL Oral TID BM   amLODipine  10 mg Oral q morning   calcium acetate  667 mg Oral TID WC   carbamazepine  1,000  mg Oral q AM   Chlorhexidine Gluconate Cloth  6 each Topical Daily   cloNIDine  0.2 mg Transdermal Weekly   doxazosin  4 mg Oral QHS   epoetin (EPOGEN/PROCRIT) injection  10,000 Units Intravenous Q M,W,F-HD   ferrous sulfate  325 mg Oral Q breakfast   fluPHENAZine  2.5 mg Oral q AM   heparin  5,000 Units Subcutaneous Q8H   hydrALAZINE  25 mg Oral TID   irbesartan  300 mg Oral Daily   isosorbide mononitrate  60 mg Oral BID   minoxidil  10 mg Oral Daily   mometasone-formoterol  2 puff Inhalation BID   multivitamin  1 tablet Oral QHS   nystatin  5 mL Oral QID   mouth rinse  15 mL Mouth Rinse 4 times per day   pantoprazole  40 mg Oral Daily   patiromer  16.8 g Oral Daily   polyethylene glycol  17 g Oral BID   trihexyphenidyl  2 mg Oral BID WC   epoetin alfa, albuterol, alteplase, anticoagulant sodium citrate, diphenhydrAMINE, haloperidol lactate, heparin, hydrALAZINE, lidocaine (PF), lidocaine-prilocaine, ondansetron **OR** ondansetron (ZOFRAN) IV, mouth rinse, pentafluoroprop-tetrafluoroeth, polyethylene glycol, senna-docusate  Assessment/ Plan:  Todd Brown is a 61  y.o.  male with past medical history including hypertension, anemia, schizophrenia and end-stage renal disease on hemodialysis.  Patient has presented to the ED with altered mental status.  Patient has been admitted for Altered mental status [R41.82] Hypertensive urgency [I16.0] Severe hypertension [I10] Altered mental status, unspecified altered mental status type [R41.82]  [facility]/MWF/Lt AVF  End-stage renal disease with hyperkalemia on hemodialysis.  Will maintain outpatient schedule if possible.   Receiving dialysis today, UF goal 2.5 to 3 L as tolerated.  Next treatment scheduled for Monday. Continuing to monitor discharge plan to include rehab placement. Patient will need outpatient dialysis clinic acceptance prior to discharge.   2. Anemia of chronic kidney disease Lab Results  Component Value  Date   HGB 9.2 (L) 01/26/2022  Hemoglobin within acceptable target. Receiving EPO 10,000 units IV with dialysis treatments.   3. Secondary Hyperparathyroidism:  Lab Results  Component Value Date   CALCIUM 8.6 (L) 01/27/2022   PHOS 5.0 (H) 01/26/2022  Calcium and phosphorus within acceptable target. Continue calcium acetate with meals.  4.  Hypertension with chronic kidney disease.  Home regimen includes amlodipine, clonidine, hydralazine, isosorbide, and losartan.    Patient is currently on amlodipine 10 mg p.o. daily Hydralazine 25 mg p.o. 3 times daily Irbesartan 300 mg p.o. daily Isosorbide 60 mg p.o. twice daily Doxazosin 4 mg p.o. daily Minoxidil 10 mg p.o. daily  Continue current regimen.  Blood pressure 104/66 during dialysis.   LOS: [place] 9/1/202310:24 AM

## 2022-01-30 NOTE — Progress Notes (Signed)
Pt 3.5 hour HD complete w/ no complications. Pt alert, oriented at baseline, vss and report to primary RN.  Start: 0946 End: 8250 3034ml fluid removed 84L BVP Epogen 10,000u w/ HD 91.8kg post bed weight

## 2022-01-30 NOTE — Progress Notes (Signed)
OT Cancellation Note  Patient Details Name: Todd Brown MRN: [id number] DOB: [birth year]   Cancelled Treatment:    Reason Eval/Treat Not Completed: Other (comment). Pt preparing for dialysis. Will re-attempt OT tx at later date/time as pt is available.   Ardeth Perfect., MPH, MS, OTR/L ascom [phone] 01/30/22, 9:29 AM

## 2022-01-31 NOTE — Progress Notes (Signed)
[facility] Progress Note  Patient: Todd Brown    TFT:[id number]  DOA: 12/15/2021    Date of Service: the patient was seen and examined on 01/31/2022  Brief hospital course: 61 year old male with past medical history of hypertension and end-stage renal disease on hemodialysis presented to the emergency room from his group home/Assisted living facility on 7/17 with confusion.  (Patient's baseline was that he is unable to care for himself and has a legal guardian.)  Patient last received dialysis on Friday, 7/14.    Patient found to have markedly elevated blood pressure with a systolic in the 706C requiring Cardene drip and placement of patient to the ICU.  Patient with questionable cardiac ischemia as well.  CT scan of head and neurological exam unremarkable.    Patient was admitted to the hospitalist service and started on his oral antihypertensives, nicardipine drip and given IV labetalol and as needed IV hydralazine.  Nephrology consulted and resumed patient's dialysis.  Over the next few weeks, blood pressure better controlled.    At times became agitated and seen by psychiatry and started on scheduled and as needed Haldol, leading to more compliance with better mentation as well as taking his antihypertensives.    Hospital course complicated by episode of periorbital swelling bilaterally felt to be secondary to subcutaneous emphysema, resolved on steroids.  Also had episode of oral thrush treated with Magic mouthwash.    Patient's assisted living facility willing to take patient back after becomes much stronger at skilled nursing facility.  Skilled nursing facility search still in progress, apparently with no bed offers thus far.  Assessment and Plan: * Acute metabolic encephalopathy-resolved as of 12/31/2021 Felt to be secondary to malignant hypertension.  Resolved and now back at baseline.  Had some increased somnolence which improved after stopping his scheduled Benadryl and  Haldol.  Hyperkalemia Potassiums have been trending upward for the past few days and patient started on Veltassa.  After dialysis on 8/28, potassium on 8/29 at 4.5.  Continue to follow.  Hypertensive emergency-resolved as of 12/31/2021 Resolved.  See below.  ESRD on dialysis [facility]) Nephrology consulted, full dialysis session following admission and again today to keep him on his Monday Wednesday Friday schedule. -Continue scheduled dialysis  Mental health disorder Unspecified.  Patient is unable to care for himself.  It is unclear if he is ever had a formal diagnosis.  He is on Tegretol and Artane which he should continue.  As needed Haldol for acute behavior agitation as above.  He is for the most part much more calm and cooperative these days over times, he intermittently refused vitals being checked or taking his medications.  Patient had a little more agitation on 8/31 night.  Adjusted Haldol as needed.  Essential hypertension Resumed on his home p.o. amlodipine 10 mg daily, clonidine 0.3 mg p.o. 3 times daily, hydralazine 100 mg p.o. 3 times daily, isosorbide mononitrate 60 mg p.o. twice daily, losartan 100 mg daily.  Pressures at time improved, but then blood pressure starts to spike again.  As above, changing ARB and adding alpha-blocker. -Nephrology added clonidine patches with p.o. clonidine. -Minoxidil was then added and titrated up to 10 mg daily.  During the first week of August, patient occasionally had episodes of lower blood pressure without any blood pressure spikes, so some of his newer medications were decreased: hydralazine and minoxidil was scaled back.  With blood pressure on the rise again, have increased minoxidil back to 10 mg and hydralazine up to 10  mg 3 times daily.  Pressures softer after dialysis and started to trend back upward the day before the next dialysis session.  Oral thrush 8/23: start nystatin suspension  Subcutaneous emphysema (HCC)-resolved as of  12/18/2021 On steroids.  Appreciate pulmonary assistance.  Subcutaneous emphysema looks to be resolved.  Changed from Solu-Medrol to p.o. prednisone quick taper.  Physical deconditioning PT OT are recommending SNF.  However, this is similar to a previous hospitalization where TOC was unable to find skilled nursing and over time, patient's strength improved that he was able to be discharged back to his assisted living facility.  Anemia of chronic disease Hbg stable. Check CBC ~weekly.  Malnutrition of moderate degree Nutrition Status: Nutrition Problem: Moderate Malnutrition Etiology: chronic illness (ESRD on HD) Signs/Symptoms: mild fat depletion, mild muscle depletion, moderate muscle depletion Interventions: Refer to RD note for recommendations, Nepro shake, MVI, Prostat       Body mass index is 27.45 kg/m.  Nutrition Problem: Moderate Malnutrition Etiology: chronic illness (ESRD on HD)     Consultants: Nephrology Critical care Psychiatry  Procedures: Hemodialysis   Antimicrobials: None  Code Status: Full code   Subjective: Patient doing ok, no complaints  Objective: Vitals:   01/31/22 0520 01/31/22 0742  BP: 132/83 120/82  Pulse: 63 65  Resp: 17 18  Temp: 97.6 F (36.4 C) 97.9 F (36.6 C)  SpO2: 99% 99%   Filed Weights   01/28/22 0850 01/30/22 0924 01/30/22 1335  Weight: 90.9 kg 93 kg 91.8 kg   Body mass index is 27.45 kg/m.  Exam: General exam: Oriented x3, no acute distress Respiratory system: Clear to auscultation bilaterally Cardiovascular system: Regular rate and rhythm, S1-S2 Gastrointestinal system: soft, nondistended, nontender, hypoactive bowel sounds Extremity: No clubbing or cyanosis, trace pitting edema Psychiatry: Withdrawn, but does not appear to be in acute psychosis   Data Reviewed: Renal panel consistent with his end-stage renal disease   Disposition:  Status is: Inpatient Remains inpatient appropriate because:   --Awaiting SNF placement  Anticipated discharge date: Unknown  Family Communication: None   DVT Prophylaxis: heparin injection 5,000 Units Start: 12/15/21 1645 Place TED hose Start: 12/15/21 1629    Author: Annita Brod , MD 01/31/2022 1:22 PM  To reach On-call, see care teams to locate the attending and reach out via [email or web address]. Between 7PM-7AM, please contact night-coverage If you still have difficulty reaching the attending provider, please page the [facility] (Director on Call) for [facility] on amion for assistance.

## 2022-01-31 NOTE — Progress Notes (Signed)
[facility]  ROUNDING NOTE   Subjective:   Todd Brown is a 61 year old male with past medical history including hypertension, anemia, schizophrenia and end-stage renal disease on hemodialysis.  Patient has presented to the ED with altered mental status.  Patient has been admitted for Altered mental status [R41.82] Hypertensive urgency [I16.0] Severe hypertension [I10] Altered mental status, unspecified altered mental status type [R41.82]  Patient is known to our practice and receives outpatient dialysis treatments at [facility] on a MWF schedule.   Update Patient seen while eating breakfast. Reports dialysis went well yesterday.      Objective:  Vital signs in last 24 hours:  Temp:  [97.6 F (36.4 C)-98.8 F (37.1 C)] 97.9 F (36.6 C) (09/02 0742) Pulse Rate:  [63-78] 65 (09/02 0742) Resp:  [10-20] 18 (09/02 0742) BP: (88-140)/(58-83) 120/82 (09/02 0742) SpO2:  [95 %-100 %] 99 % (09/02 0742) Weight:  [91.8 kg] 91.8 kg (09/01 1335)  Weight change:  Filed Weights   01/28/22 0850 01/30/22 0924 01/30/22 1335  Weight: 90.9 kg 93 kg 91.8 kg    Intake/Output: I/O last 3 completed shifts: In: 300 [P.O.:300] Out: 3000 [Other:3000]   Intake/Output this shift:  No intake/output data recorded.  Physical Exam: General: NAD  Head: Normocephalic, atraumatic. Moist oral mucosal membranes  Eyes: Anicteric  Lungs:  Clear to auscultation, normal effort, room air  Heart: Regular rate and rhythm  Abdomen:  Soft, nontender, non distended  Extremities:  1+ peripheral edema.  Neurologic: Alert, answer simple questions  Skin: No lesions  Access: Lt AVF     Basic Metabolic Panel: Recent Labs  Lab 01/26/22 0820 01/27/22 0513 01/30/22 0945  NA 133* 137 135  K 5.3* 4.5 4.8  CL 95* 98 97*  CO2 27 29 27   GLUCOSE 80 77 120*  BUN 105* 81* 99*  CREATININE 7.19* 5.72* 6.44*  CALCIUM 8.9 8.6* 9.3  PHOS 5.0*  --   --      Liver Function Tests: Recent  Labs  Lab 01/26/22 0820  ALBUMIN 2.7*     No results for input(s): "LIPASE", "AMYLASE" in the last 168 hours. No results for input(s): "AMMONIA" in the last 168 hours.  CBC: Recent Labs  Lab 01/26/22 0828  WBC 4.6  NEUTROABS 3.0  HGB 9.2*  HCT 28.1*  MCV 103.7*  PLT 196     Cardiac Enzymes: No results for input(s): "CKTOTAL", "CKMB", "CKMBINDEX", "TROPONINI" in the last 168 hours.  BNP: Invalid input(s): "POCBNP"  CBG: No results for input(s): "GLUCAP" in the last 168 hours.   Microbiology: Results for orders placed or performed during the hospital encounter of 12/15/21  SARS Coronavirus 2 by RT PCR (hospital order, performed in [facility] hospital lab) *cepheid single result test* Anterior Nasal Swab     Status: None   Collection Time: 12/15/21  5:50 PM   Specimen: Anterior Nasal Swab  Result Value Ref Range Status   SARS Coronavirus 2 by RT PCR NEGATIVE NEGATIVE Final    Comment: (NOTE) SARS-CoV-2 target nucleic acids are NOT DETECTED.  The SARS-CoV-2 RNA is generally detectable in upper and lower respiratory specimens during the acute phase of infection. The lowest concentration of SARS-CoV-2 viral copies this assay can detect is 250 copies / mL. A negative result does not preclude SARS-CoV-2 infection and should not be used as the sole basis for treatment or other patient management decisions.  A negative result may occur with improper specimen collection / handling, submission of specimen other  than nasopharyngeal swab, presence of viral mutation(s) within the areas targeted by this assay, and inadequate number of viral copies (<250 copies / mL). A negative result must be combined with clinical observations, patient history, and epidemiological information.  Fact Sheet for Patients:   https://www.patel.info/  Fact Sheet for Healthcare Providers: https://hall.com/  This test is not yet approved or  cleared by  the [place] FDA and has been authorized for detection and/or diagnosis of SARS-CoV-2 by FDA under an Emergency Use Authorization (EUA).  This EUA will remain in effect (meaning this test can be used) for the duration of the COVID-19 declaration under Section 564(b)(1) of the Act, 21 U.S.C. section 360bbb-3(b)(1), unless the authorization is terminated or revoked sooner.  Performed at [facility], [address] [place], [place] [address]   MRSA Next Gen by PCR, Nasal     Status: None   Collection Time: 12/15/21  9:03 PM   Specimen: Nasal Mucosa; Nasal Swab  Result Value Ref Range Status   MRSA by PCR Next Gen NOT DETECTED NOT DETECTED Final    Comment: (NOTE) The GeneXpert MRSA Assay (FDA approved for NASAL specimens only), is one component of a comprehensive MRSA colonization surveillance program. It is not intended to diagnose MRSA infection nor to guide or monitor treatment for MRSA infections. Test performance is not FDA approved in patients less than 17 years old. Performed at [facility], [address] [place], [place] [address]     Coagulation Studies: No results for input(s): "LABPROT", "INR" in the last 72 hours.  Urinalysis: No results for input(s): "COLORURINE", "LABSPEC", "PHURINE", "GLUCOSEU", "HGBUR", "BILIRUBINUR", "KETONESUR", "PROTEINUR", "UROBILINOGEN", "NITRITE", "LEUKOCYTESUR" in the last 72 hours.  Invalid input(s): "APPERANCEUR"    Imaging: No results found.   Medications:    anticoagulant sodium citrate      (feeding supplement) PROSource Plus  30 mL Oral TID BM   amLODipine  10 mg Oral q morning   calcium acetate  667 mg Oral TID WC   carbamazepine  1,000 mg Oral q AM   Chlorhexidine Gluconate Cloth  6 each Topical Daily   cloNIDine  0.2 mg Transdermal Weekly   doxazosin  4 mg Oral QHS   epoetin (EPOGEN/PROCRIT) injection  10,000 Units Intravenous Q M,W,F-HD   ferrous sulfate  325 mg Oral Q breakfast    fluPHENAZine  2.5 mg Oral q AM   heparin  5,000 Units Subcutaneous Q8H   hydrALAZINE  25 mg Oral TID   irbesartan  300 mg Oral Daily   isosorbide mononitrate  60 mg Oral BID   minoxidil  10 mg Oral Daily   mometasone-formoterol  2 puff Inhalation BID   multivitamin  1 tablet Oral QHS   nystatin  5 mL Oral QID   mouth rinse  15 mL Mouth Rinse 4 times per day   pantoprazole  40 mg Oral Daily   patiromer  16.8 g Oral Daily   polyethylene glycol  17 g Oral BID   trihexyphenidyl  2 mg Oral BID WC   albuterol, alteplase, anticoagulant sodium citrate, diphenhydrAMINE, haloperidol lactate, heparin, hydrALAZINE, lidocaine (PF), lidocaine-prilocaine, ondansetron **OR** ondansetron (ZOFRAN) IV, mouth rinse, pentafluoroprop-tetrafluoroeth, polyethylene glycol, senna-docusate  Assessment/ Plan:  [name] is a 61 y.o.  male with past medical history including hypertension, anemia, schizophrenia and end-stage renal disease on hemodialysis.  Patient has presented to the ED with altered mental status.  Patient has been admitted for Altered mental status [R41.82] Hypertensive urgency [I16.0]  Severe hypertension [I10] Altered mental status, unspecified altered mental status type [R41.82]  [facility]/MWF/Lt AVF  End-stage renal disease with hyperkalemia on hemodialysis.  Will maintain outpatient schedule if possible.   Next treatment scheduled for Monday. Continuing to monitor discharge plan to include rehab placement. Patient will need outpatient dialysis clinic acceptance prior to discharge.   2. Anemia of chronic kidney disease Lab Results  Component Value Date   HGB 9.2 (L) 01/26/2022  Hemoglobin within acceptable target. Receiving EPO 10,000 units IV with dialysis treatments.   3. Secondary Hyperparathyroidism:  Lab Results  Component Value Date   CALCIUM 9.3 01/30/2022   PHOS 5.0 (H) 01/26/2022  Calcium and phosphorus within acceptable target. Continue calcium acetate  with meals.  4.  Hypertension with chronic kidney disease.  Home regimen includes amlodipine, clonidine, hydralazine, isosorbide, and losartan.    Patient is currently on amlodipine 10 mg p.o. daily Hydralazine 25 mg p.o. 3 times daily Irbesartan 300 mg p.o. daily Isosorbide 60 mg p.o. twice daily Doxazosin 4 mg p.o. daily Minoxidil 10 mg p.o. daily  Continue current regimen.  Blood pressure 120/82   LOS: [place] 9/2/20239:53 AM

## 2022-02-01 NOTE — TOC Progression Note (Signed)
Transition of Care [facility]) - Progression Note    Patient Details  Name: Todd Brown MRN: [id number] Date of Birth: 1961/04/15  [facility] of [facility]) CM/SW Contact  Izola Price, RN Phone Number: 02/01/2022, 5:21 PM  Clinical Narrative: 9/3: Bed offers pending or in consideration. 521 pm. Simmie Davies RN CM       Expected Discharge Plan: Skilled Nursing Facility Barriers to Discharge: Continued Medical Work up  Expected Discharge Plan and Services Expected Discharge Plan: [place] arrangements for the past 2 months: [place] Expected Discharge Date: 12/19/21                                     Social Determinants of Health (SDOH) Interventions    Readmission Risk Interventions     No data to display

## 2022-02-01 NOTE — Progress Notes (Signed)
[facility]  ROUNDING NOTE   Subjective:   Todd Brown is a 61 year old male with past medical history including hypertension, anemia, schizophrenia and end-stage renal disease on hemodialysis.  Patient has presented to the ED with altered mental status.  Patient has been admitted for Altered mental status [R41.82] Hypertensive urgency [I16.0] Severe hypertension [I10] Altered mental status, unspecified altered mental status type [R41.82]  Patient is known to our practice and receives outpatient dialysis treatments at [facility] on a MWF schedule.   Update Patient seen while eating breakfast. No complaints from the patient.  CNA reports patient has swelling in his "private parts"      Objective:  Vital signs in last 24 hours:  Temp:  [97.9 F (36.6 C)-98.7 F (37.1 C)] 98.7 F (37.1 C) (09/03 0754) Pulse Rate:  [67-74] 71 (09/03 0754) Resp:  [16-18] 16 (09/03 0754) BP: (109-161)/(68-77) 161/77 (09/03 0754) SpO2:  [96 %-100 %] 96 % (09/03 0754) Weight:  [95.2 kg] 95.2 kg (09/03 0500)  Weight change: 2.2 kg Filed Weights   01/30/22 0924 01/30/22 1335 02/01/22 0500  Weight: 93 kg 91.8 kg 95.2 kg    Intake/Output: I/O last 3 completed shifts: In: 120 [P.O.:120] Out: -    Intake/Output this shift:  No intake/output data recorded.  Physical Exam: General: NAD  Head: Normocephalic, atraumatic. Moist oral mucosal membranes  Eyes: Anicteric  Lungs:  Clear to auscultation, normal effort, room air  Heart: Regular rate and rhythm  Abdomen:  Soft, nontender, non distended  Extremities:  3+ peripheral edema.  Neurologic: Alert, answer simple questions  Skin: No lesions  Access: Lt AVF     Basic Metabolic Panel: Recent Labs  Lab 01/26/22 0820 01/27/22 0513 01/30/22 0945  NA 133* 137 135  K 5.3* 4.5 4.8  CL 95* 98 97*  CO2 27 29 27   GLUCOSE 80 77 120*  BUN 105* 81* 99*  CREATININE 7.19* 5.72* 6.44*  CALCIUM 8.9 8.6* 9.3  PHOS 5.0*  --    --      Liver Function Tests: Recent Labs  Lab 01/26/22 0820  ALBUMIN 2.7*     No results for input(s): "LIPASE", "AMYLASE" in the last 168 hours. No results for input(s): "AMMONIA" in the last 168 hours.  CBC: Recent Labs  Lab 01/26/22 0828  WBC 4.6  NEUTROABS 3.0  HGB 9.2*  HCT 28.1*  MCV 103.7*  PLT 196     Cardiac Enzymes: No results for input(s): "CKTOTAL", "CKMB", "CKMBINDEX", "TROPONINI" in the last 168 hours.  BNP: Invalid input(s): "POCBNP"  CBG: No results for input(s): "GLUCAP" in the last 168 hours.   Microbiology: Results for orders placed or performed during the hospital encounter of 12/15/21  SARS Coronavirus 2 by RT PCR (hospital order, performed in [facility] hospital lab) *cepheid single result test* Anterior Nasal Swab     Status: None   Collection Time: 12/15/21  5:50 PM   Specimen: Anterior Nasal Swab  Result Value Ref Range Status   SARS Coronavirus 2 by RT PCR NEGATIVE NEGATIVE Final    Comment: (NOTE) SARS-CoV-2 target nucleic acids are NOT DETECTED.  The SARS-CoV-2 RNA is generally detectable in upper and lower respiratory specimens during the acute phase of infection. The lowest concentration of SARS-CoV-2 viral copies this assay can detect is 250 copies / mL. A negative result does not preclude SARS-CoV-2 infection and should not be used as the sole basis for treatment or other patient management decisions.  A negative result may  occur with improper specimen collection / handling, submission of specimen other than nasopharyngeal swab, presence of viral mutation(s) within the areas targeted by this assay, and inadequate number of viral copies (<250 copies / mL). A negative result must be combined with clinical observations, patient history, and epidemiological information.  Fact Sheet for Patients:   https://www.patel.info/  Fact Sheet for Healthcare  Providers: https://hall.com/  This test is not yet approved or  cleared by the [place] FDA and has been authorized for detection and/or diagnosis of SARS-CoV-2 by FDA under an Emergency Use Authorization (EUA).  This EUA will remain in effect (meaning this test can be used) for the duration of the COVID-19 declaration under Section 564(b)(1) of the Act, 21 U.S.C. section 360bbb-3(b)(1), unless the authorization is terminated or revoked sooner.  Performed at [facility], [address] [place], [place] [address]   MRSA Next Gen by PCR, Nasal     Status: None   Collection Time: 12/15/21  9:03 PM   Specimen: Nasal Mucosa; Nasal Swab  Result Value Ref Range Status   MRSA by PCR Next Gen NOT DETECTED NOT DETECTED Final    Comment: (NOTE) The GeneXpert MRSA Assay (FDA approved for NASAL specimens only), is one component of a comprehensive MRSA colonization surveillance program. It is not intended to diagnose MRSA infection nor to guide or monitor treatment for MRSA infections. Test performance is not FDA approved in patients less than 6 years old. Performed at [facility], [address] [place], [place] [address]     Coagulation Studies: No results for input(s): "LABPROT", "INR" in the last 72 hours.  Urinalysis: No results for input(s): "COLORURINE", "LABSPEC", "PHURINE", "GLUCOSEU", "HGBUR", "BILIRUBINUR", "KETONESUR", "PROTEINUR", "UROBILINOGEN", "NITRITE", "LEUKOCYTESUR" in the last 72 hours.  Invalid input(s): "APPERANCEUR"    Imaging: No results found.   Medications:    anticoagulant sodium citrate      (feeding supplement) PROSource Plus  30 mL Oral TID BM   amLODipine  10 mg Oral q morning   calcium acetate  667 mg Oral TID WC   carbamazepine  1,000 mg Oral q AM   Chlorhexidine Gluconate Cloth  6 each Topical Daily   cloNIDine  0.2 mg Transdermal Weekly   doxazosin  4 mg Oral QHS   epoetin (EPOGEN/PROCRIT)  injection  10,000 Units Intravenous Q M,W,F-HD   ferrous sulfate  325 mg Oral Q breakfast   fluPHENAZine  2.5 mg Oral q AM   heparin  5,000 Units Subcutaneous Q8H   hydrALAZINE  25 mg Oral TID   irbesartan  300 mg Oral Daily   isosorbide mononitrate  60 mg Oral BID   minoxidil  10 mg Oral Daily   mometasone-formoterol  2 puff Inhalation BID   multivitamin  1 tablet Oral QHS   nystatin  5 mL Oral QID   mouth rinse  15 mL Mouth Rinse 4 times per day   pantoprazole  40 mg Oral Daily   patiromer  16.8 g Oral Daily   polyethylene glycol  17 g Oral BID   trihexyphenidyl  2 mg Oral BID WC   albuterol, alteplase, anticoagulant sodium citrate, diphenhydrAMINE, haloperidol lactate, heparin, hydrALAZINE, lidocaine (PF), lidocaine-prilocaine, ondansetron **OR** ondansetron (ZOFRAN) IV, mouth rinse, pentafluoroprop-tetrafluoroeth, polyethylene glycol, senna-docusate  Assessment/ Plan:  [name] is a 61 y.o.  male with past medical history including hypertension, anemia, schizophrenia and end-stage renal disease on hemodialysis.  Patient has presented to the ED with altered mental status.  Patient  has been admitted for Altered mental status [R41.82] Hypertensive urgency [I16.0] Severe hypertension [I10] Altered mental status, unspecified altered mental status type [R41.82]  [facility]/MWF/Lt AVF  End-stage renal disease with hyperkalemia on hemodialysis.  Will maintain outpatient schedule if possible.   Next treatment scheduled for Monday. Will prepare orders. UF goal 3L  Continuing to monitor discharge plan to include rehab placement. Patient will need outpatient dialysis clinic acceptance prior to discharge.   2. Anemia of chronic kidney disease Lab Results  Component Value Date   HGB 9.2 (L) 01/26/2022  Hemoglobin within acceptable target. Receiving EPO 10,000 units IV with dialysis treatments.   3. Secondary Hyperparathyroidism:  Lab Results  Component Value Date    CALCIUM 9.3 01/30/2022   PHOS 5.0 (H) 01/26/2022  Calcium and phosphorus within acceptable target. Continue calcium acetate with meals.  4.  Hypertension with chronic kidney disease.  Home regimen includes amlodipine, clonidine, hydralazine, isosorbide, and losartan.    Patient is currently on amlodipine 10 mg p.o. daily Hydralazine 25 mg p.o. 3 times daily Irbesartan 300 mg p.o. daily Isosorbide 60 mg p.o. twice daily Doxazosin 4 mg p.o. daily Minoxidil 10 mg p.o. daily  Continue current regimen.  Blood pressure 120/82   LOS: [place] 9/3/20239:51 AM

## 2022-02-01 NOTE — Progress Notes (Signed)
[facility] Progress Note  Patient: Todd Brown    HWE:[id number]  DOA: 12/15/2021    Date of Service: the patient was seen and examined on 02/01/2022  Brief hospital course: 61 year old male with past medical history of hypertension and end-stage renal disease on hemodialysis presented to the emergency room from his group home/Assisted living facility on 7/17 with confusion.  (Patient's baseline was that he is unable to care for himself and has a legal guardian.)  Patient last received dialysis on Friday, 7/14.    Patient found to have markedly elevated blood pressure with a systolic in the 893Y requiring Cardene drip and placement of patient to the ICU.  Patient with questionable cardiac ischemia as well.  CT scan of head and neurological exam unremarkable.    Patient was admitted to the hospitalist service and started on his oral antihypertensives, nicardipine drip and given IV labetalol and as needed IV hydralazine.  Nephrology consulted and resumed patient's dialysis.  Over the next few weeks, blood pressure better controlled.    At times became agitated and seen by psychiatry and started on scheduled and as needed Haldol, leading to more compliance with better mentation as well as taking his antihypertensives.    Hospital course complicated by episode of periorbital swelling bilaterally felt to be secondary to subcutaneous emphysema, resolved on steroids.  Also had episode of oral thrush treated with Magic mouthwash.    Patient's assisted living facility willing to take patient back after becomes much stronger at skilled nursing facility.  Skilled nursing facility search still in progress, apparently with no bed offers thus far.  Assessment and Plan: * Acute metabolic encephalopathy-resolved as of 12/31/2021 Felt to be secondary to malignant hypertension.  Resolved and now back at baseline.  Had some increased somnolence which improved after stopping his scheduled Benadryl and  Haldol.  Hyperkalemia Potassiums have been trending upward for the past few days and patient started on Veltassa.  After dialysis on 8/28, potassium on 8/29 at 4.5.  Continue to follow.  Hypertensive emergency-resolved as of 12/31/2021 Resolved.  See below.  ESRD on dialysis [facility]) Nephrology consulted, full dialysis session following admission and again today to keep him on his Monday Wednesday Friday schedule. -Continue scheduled dialysis  Mental health disorder Unspecified.  Patient is unable to care for himself.  It is unclear if he is ever had a formal diagnosis.  He is on Tegretol and Artane which he should continue.  As needed Haldol for acute behavior agitation as above.  He is for the most part much more calm and cooperative these days over times, he intermittently refused vitals being checked or taking his medications.  Patient had a little more agitation on 8/31 night.  Adjusted Haldol as needed.  Essential hypertension Resumed on his home p.o. amlodipine 10 mg daily, clonidine 0.3 mg p.o. 3 times daily, hydralazine 100 mg p.o. 3 times daily, isosorbide mononitrate 60 mg p.o. twice daily, losartan 100 mg daily.  Pressures at time improved, but then blood pressure starts to spike again.  As above, changing ARB and adding alpha-blocker. -Nephrology added clonidine patches with p.o. clonidine. -Minoxidil was then added and titrated up to 10 mg daily.  During the first week of August, patient occasionally had episodes of lower blood pressure without any blood pressure spikes, so some of his newer medications were decreased: hydralazine and minoxidil was scaled back.  With blood pressure on the rise again, have increased minoxidil back to 10 mg and hydralazine up to 10  mg 3 times daily.  Pressures softer after dialysis and started to trend back upward the day before the next dialysis session.  Oral thrush 8/23: start nystatin suspension  Subcutaneous emphysema (HCC)-resolved as of  12/18/2021 On steroids.  Appreciate pulmonary assistance.  Subcutaneous emphysema looks to be resolved.  Changed from Solu-Medrol to p.o. prednisone quick taper.  Physical deconditioning PT OT are recommending SNF.  However, this is similar to a previous hospitalization where TOC was unable to find skilled nursing and over time, patient's strength improved that he was able to be discharged back to his assisted living facility.  Anemia of chronic disease Hbg stable. Check CBC ~weekly.  Malnutrition of moderate degree Nutrition Status: Nutrition Problem: Moderate Malnutrition Etiology: chronic illness (ESRD on HD) Signs/Symptoms: mild fat depletion, mild muscle depletion, moderate muscle depletion Interventions: Refer to RD note for recommendations, Nepro shake, MVI, Prostat       Body mass index is 28.46 kg/m.  Nutrition Problem: Moderate Malnutrition Etiology: chronic illness (ESRD on HD)     Consultants: Nephrology Critical care Psychiatry  Procedures: Hemodialysis   Antimicrobials: None  Code Status: Full code   Subjective: About the same, no complaints  Objective: Blood pressure slightly trending up this morning Vitals:   02/01/22 0512 02/01/22 0754  BP: 118/68 (!) 161/77  Pulse: 67 71  Resp: 18 16  Temp: 98.2 F (36.8 C) 98.7 F (37.1 C)  SpO2: 97% 96%   Filed Weights   01/30/22 0924 01/30/22 1335 02/01/22 0500  Weight: 93 kg 91.8 kg 95.2 kg   Body mass index is 28.46 kg/m.  Exam: Exam unchanged from previous day General exam: Oriented x3, no acute distress Respiratory system: Clear to auscultation bilaterally Cardiovascular system: Regular rate and rhythm, S1-S2 Gastrointestinal system: soft, nondistended, nontender, hypoactive bowel sounds Extremity: No clubbing or cyanosis, trace pitting edema Psychiatry: Withdrawn, but does not appear to be in acute psychosis   Data Reviewed: No labs today   Disposition:  Status is:  Inpatient Remains inpatient appropriate because:  --Awaiting SNF placement  Anticipated discharge date: Unknown  Family Communication: None   DVT Prophylaxis: heparin injection 5,000 Units Start: 12/15/21 1645 Place TED hose Start: 12/15/21 1629    Author: Annita Brod , MD 02/01/2022 1:07 PM  To reach On-call, see care teams to locate the attending and reach out via [email or web address]. Between 7PM-7AM, please contact night-coverage If you still have difficulty reaching the attending provider, please page the [facility] (Director on Call) for [facility] on amion for assistance.

## 2022-02-02 LAB — CBC
HCT: 30.9 % — ABNORMAL LOW (ref 39.0–52.0)
Hemoglobin: 10.2 g/dL — ABNORMAL LOW (ref 13.0–17.0)
MCH: 33.8 pg (ref 26.0–34.0)
MCHC: 33 g/dL (ref 30.0–36.0)
MCV: 102.3 fL — ABNORMAL HIGH (ref 80.0–100.0)
Platelets: 165 10*3/uL (ref 150–400)
RBC: 3.02 MIL/uL — ABNORMAL LOW (ref 4.22–5.81)
RDW: 14.9 % (ref 11.5–15.5)
WBC: 4.4 10*3/uL (ref 4.0–10.5)
nRBC: 0 % (ref 0.0–0.2)

## 2022-02-02 LAB — RENAL FUNCTION PANEL
Albumin: 3 g/dL — ABNORMAL LOW (ref 3.5–5.0)
Anion gap: 14 (ref 5–15)
BUN: 108 mg/dL — ABNORMAL HIGH (ref 8–23)
CO2: 26 mmol/L (ref 22–32)
Calcium: 9.2 mg/dL (ref 8.9–10.3)
Chloride: 90 mmol/L — ABNORMAL LOW (ref 98–111)
Creatinine, Ser: 7.03 mg/dL — ABNORMAL HIGH (ref 0.61–1.24)
GFR, Estimated: 8 mL/min — ABNORMAL LOW (ref >60)
Glucose, Bld: 81 mg/dL (ref 70–99)
Phosphorus: 6.4 mg/dL — ABNORMAL HIGH (ref 2.5–4.6)
Potassium: 6.2 mmol/L — ABNORMAL HIGH (ref 3.5–5.1)
Sodium: 130 mmol/L — ABNORMAL LOW (ref 135–145)

## 2022-02-02 MED ORDER — EPOETIN ALFA 10000 UNIT/ML IJ SOLN
INTRAMUSCULAR | Status: AC
  Administered 2022-02-02: 10000 [IU] via INTRAVENOUS
  Filled 2022-02-02: qty 1

## 2022-02-02 NOTE — Progress Notes (Signed)
[facility] Progress Note  Patient: Todd Brown    GDJ:[id number]  DOA: 12/15/2021    Date of Service: the patient was seen and examined on 02/02/2022  Brief hospital course: 61 year old male with past medical history of hypertension and end-stage renal disease on hemodialysis presented to the emergency room from his group home/Assisted living facility on 7/17 with confusion.  (Patient's baseline was that he is unable to care for himself and has a legal guardian.)  Patient last received dialysis on Friday, 7/14.    Patient found to have markedly elevated blood pressure with a systolic in the 622W requiring Cardene drip and placement of patient to the ICU.  Patient with questionable cardiac ischemia as well.  CT scan of head and neurological exam unremarkable.    Patient was admitted to the hospitalist service and started on his oral antihypertensives, nicardipine drip and given IV labetalol and as needed IV hydralazine.  Nephrology consulted and resumed patient's dialysis.  Over the next few weeks, blood pressure better controlled.    At times became agitated and seen by psychiatry and started on scheduled and as needed Haldol, leading to more compliance with better mentation as well as taking his antihypertensives.    Hospital course complicated by episode of periorbital swelling bilaterally felt to be secondary to subcutaneous emphysema, resolved on steroids.  Also had episode of oral thrush treated with Magic mouthwash.    Patient's assisted living facility willing to take patient back after becomes much stronger at skilled nursing facility.  Skilled nursing facility search still in progress, apparently with no bed offers thus far.  Assessment and Plan: * Acute metabolic encephalopathy-resolved as of 12/31/2021 Felt to be secondary to malignant hypertension.  Resolved and now back at baseline.  Had some increased somnolence which improved after stopping his scheduled Benadryl and  Haldol.  Hyperkalemia Potassiums have been trending upward for the past few days and patient started on Veltassa.  After dialysis on 8/28, potassium on 8/29 at 4.5.  Continue to follow.  Hypertensive emergency-resolved as of 12/31/2021 Resolved.  See below.  ESRD on dialysis [facility]) Nephrology consulted, full dialysis session following admission and again today to keep him on his Monday Wednesday Friday schedule. -Continue scheduled dialysis  Mental health disorder Unspecified.  Patient is unable to care for himself.  It is unclear if he is ever had a formal diagnosis.  He is on Tegretol and Artane which he should continue.  As needed Haldol for acute behavior agitation as above.  He is for the most part much more calm and cooperative these days over times, he intermittently refused vitals being checked or taking his medications.  Patient had a little more agitation on 8/31 night.  Adjusted Haldol as needed.  Essential hypertension Resumed on his home p.o. amlodipine 10 mg daily, clonidine 0.3 mg p.o. 3 times daily, hydralazine 100 mg p.o. 3 times daily, isosorbide mononitrate 60 mg p.o. twice daily, losartan 100 mg daily.  Pressures at time improved, but then blood pressure starts to spike again.  As above, changing ARB and adding alpha-blocker. -Nephrology added clonidine patches with p.o. clonidine. -Minoxidil was then added and titrated up to 10 mg daily.  During the first week of August, patient occasionally had episodes of lower blood pressure without any blood pressure spikes, so some of his newer medications were decreased: hydralazine and minoxidil was scaled back.  With blood pressure on the rise again, have increased minoxidil back to 10 mg and hydralazine up to 10  mg 3 times daily.  Pressures softer after dialysis and started to trend back upward the day before the next dialysis session.  Oral thrush 8/23: start nystatin suspension  Subcutaneous emphysema (HCC)-resolved as of  12/18/2021 On steroids.  Appreciate pulmonary assistance.  Subcutaneous emphysema looks to be resolved.  Changed from Solu-Medrol to p.o. prednisone quick taper.  Physical deconditioning PT OT are recommending SNF.  However, this is similar to a previous hospitalization where TOC was unable to find skilled nursing and over time, patient's strength improved that he was able to be discharged back to his assisted living facility.  Anemia of chronic disease Hbg stable. Check CBC ~weekly.  Malnutrition of moderate degree Nutrition Status: Nutrition Problem: Moderate Malnutrition Etiology: chronic illness (ESRD on HD) Signs/Symptoms: mild fat depletion, mild muscle depletion, moderate muscle depletion Interventions: Refer to RD note for recommendations, Nepro shake, MVI, Prostat       Body mass index is 28.52 kg/m.  Nutrition Problem: Moderate Malnutrition Etiology: chronic illness (ESRD on HD)     Consultants: Nephrology Critical care Psychiatry  Procedures: Hemodialysis   Antimicrobials: None  Code Status: Full code   Subjective: Patient with no complaints  Objective: Blood pressure slightly trending up this morning Vitals:   02/02/22 1244 02/02/22 1259  BP: 131/79 113/74  Pulse: 65 62  Resp: 14 (!) 9  Temp: 98.2 F (36.8 C)   SpO2: 95% 95%   Filed Weights   01/30/22 1335 02/01/22 0500 02/02/22 1244  Weight: 91.8 kg 95.2 kg 95.4 kg   Body mass index is 28.52 kg/m.  Exam: Exam unchanged from previous day General exam: Oriented x3, no acute distress Respiratory system: Clear to auscultation bilaterally Cardiovascular system: Regular rate and rhythm, S1-S2 Gastrointestinal system: soft, nondistended, nontender, hypoactive bowel sounds Extremity: No clubbing or cyanosis, trace pitting edema Psychiatry: Withdrawn, but does not appear to be in acute psychosis   Data Reviewed: Hemoglobin stable, up to 10.2   Disposition:  Status is: Inpatient Remains  inpatient appropriate because:  --Awaiting SNF placement  Anticipated discharge date: Unknown  Family Communication: None   DVT Prophylaxis: heparin injection 5,000 Units Start: 12/15/21 1645 Place TED hose Start: 12/15/21 1629    Author: Annita Brod , MD 02/02/2022 1:03 PM  To reach On-call, see care teams to locate the attending and reach out via [email or web address]. Between 7PM-7AM, please contact night-coverage If you still have difficulty reaching the attending provider, please page the [facility] (Director on Call) for [facility] on amion for assistance.

## 2022-02-02 NOTE — Plan of Care (Signed)

## 2022-02-02 NOTE — Progress Notes (Signed)
OT Cancellation Note  Patient Details Name: Todd Brown MRN: [id number] DOB: 03-23-1961   Cancelled Treatment:    Reason Eval/Treat Not Completed: Other (comment). Pt currently off the floor for dialysis. Will re-attempt OT tx at later date/time as pt is available.   Lanelle Bal  [facility] 02/02/2022, 2:43 PM

## 2022-02-02 NOTE — Progress Notes (Signed)
Pre hd rn assessment 

## 2022-02-02 NOTE — TOC Progression Note (Signed)
Transition of Care [facility]) - Progression Note    Patient Details  Name: Todd Brown MRN: [id number] Date of Birth: 1961-03-19  Transition of Care [facility]) CM/SW Contact  Laurena Slimmer, RN Phone Number: 02/02/2022, 9:49 AM  Clinical Narrative:    Attempt to reach admissions at [facility] for follow up on clinical review. No answer.    Expected Discharge Plan: [place] Barriers to Discharge: Continued Medical Work up  Expected Discharge Plan and Services Expected Discharge Plan: [place] arrangements for the past 2 months: [place] Expected Discharge Date: 12/19/21                                     Social Determinants of Health (SDOH) Interventions    Readmission Risk Interventions     No data to display

## 2022-02-02 NOTE — Progress Notes (Signed)
Pt 3.5 hour HD tx complete. UF off last few minutes of treatment d/t sbp <90. Currently, vss, no c/o, report to primary RN. Start: 1259 End: 1631 2868ml fluid removed 84L BVP Epogen 10000u given w/ HD 93.8Kg post bed weight

## 2022-02-02 NOTE — Progress Notes (Signed)
Pt returned from dialysis at approximately 1700. Pt slightly drowsy, but responds to questions. VSS. Pt asked to rest for a bit. Did not want to take his evening medications.

## 2022-02-02 NOTE — Progress Notes (Signed)
[facility]  ROUNDING NOTE   Subjective:   Todd Brown is a 61 year old male with past medical history including hypertension, anemia, schizophrenia and end-stage renal disease on hemodialysis.  Patient has presented to the ED with altered mental status.  Patient has been admitted for Altered mental status [R41.82] Hypertensive urgency [I16.0] Severe hypertension [I10] Altered mental status, unspecified altered mental status type [R41.82]  Patient is known to our practice and receives outpatient dialysis treatments at [facility] on a MWF schedule.   Update Patient resting comfortably this AM. Due for hemodialysis treatment later today.     Objective:  Vital signs in last 24 hours:  Temp:  [97.5 F (36.4 C)-98.5 F (36.9 C)] 98.2 F (36.8 C) (09/04 1244) Pulse Rate:  [59-71] 65 (09/04 1244) Resp:  [14-20] 14 (09/04 1244) BP: (122-167)/(71-85) 131/79 (09/04 1244) SpO2:  [95 %-100 %] 95 % (09/04 1244) Weight:  [95.4 kg] 95.4 kg (09/04 1244)  Weight change:  Filed Weights   01/30/22 1335 02/01/22 0500 02/02/22 1244  Weight: 91.8 kg 95.2 kg 95.4 kg    Intake/Output: No intake/output data recorded.   Intake/Output this shift:  No intake/output data recorded.  Physical Exam: General: NAD  Head: Normocephalic, atraumatic. Moist oral mucosal membranes  Eyes: Anicteric  Lungs:  Clear to auscultation, normal effort, room air  Heart: Regular rate and rhythm  Abdomen:  Soft, nontender, non distended  Extremities:  3+ peripheral edema.  Neurologic: Alert, answer simple questions  Skin: No lesions  Access: Lt AVF     Basic Metabolic Panel: Recent Labs  Lab 01/27/22 0513 01/30/22 0945  NA 137 135  K 4.5 4.8  CL 98 97*  CO2 29 27  GLUCOSE 77 120*  BUN 81* 99*  CREATININE 5.72* 6.44*  CALCIUM 8.6* 9.3     Liver Function Tests: No results for input(s): "AST", "ALT", "ALKPHOS", "BILITOT", "PROT", "ALBUMIN" in the last 168 hours.   No  results for input(s): "LIPASE", "AMYLASE" in the last 168 hours. No results for input(s): "AMMONIA" in the last 168 hours.  CBC: No results for input(s): "WBC", "NEUTROABS", "HGB", "HCT", "MCV", "PLT" in the last 168 hours.   Cardiac Enzymes: No results for input(s): "CKTOTAL", "CKMB", "CKMBINDEX", "TROPONINI" in the last 168 hours.  BNP: Invalid input(s): "POCBNP"  CBG: No results for input(s): "GLUCAP" in the last 168 hours.   Microbiology: Results for orders placed or performed during the hospital encounter of 12/15/21  SARS Coronavirus 2 by RT PCR (hospital order, performed in [facility] hospital lab) *cepheid single result test* Anterior Nasal Swab     Status: None   Collection Time: 12/15/21  5:50 PM   Specimen: Anterior Nasal Swab  Result Value Ref Range Status   SARS Coronavirus 2 by RT PCR NEGATIVE NEGATIVE Final    Comment: (NOTE) SARS-CoV-2 target nucleic acids are NOT DETECTED.  The SARS-CoV-2 RNA is generally detectable in upper and lower respiratory specimens during the acute phase of infection. The lowest concentration of SARS-CoV-2 viral copies this assay can detect is 250 copies / mL. A negative result does not preclude SARS-CoV-2 infection and should not be used as the sole basis for treatment or other patient management decisions.  A negative result may occur with improper specimen collection / handling, submission of specimen other than nasopharyngeal swab, presence of viral mutation(s) within the areas targeted by this assay, and inadequate number of viral copies (<250 copies / mL). A negative result must be combined with clinical observations,  patient history, and epidemiological information.  Fact Sheet for Patients:   https://www.patel.info/  Fact Sheet for Healthcare Providers: https://hall.com/  This test is not yet approved or  cleared by the [place] FDA and has been authorized for detection  and/or diagnosis of SARS-CoV-2 by FDA under an Emergency Use Authorization (EUA).  This EUA will remain in effect (meaning this test can be used) for the duration of the COVID-19 declaration under Section 564(b)(1) of the Act, 21 U.S.C. section 360bbb-3(b)(1), unless the authorization is terminated or revoked sooner.  Performed at [facility], [address] [place], [place] [address]   MRSA Next Gen by PCR, Nasal     Status: None   Collection Time: 12/15/21  9:03 PM   Specimen: Nasal Mucosa; Nasal Swab  Result Value Ref Range Status   MRSA by PCR Next Gen NOT DETECTED NOT DETECTED Final    Comment: (NOTE) The GeneXpert MRSA Assay (FDA approved for NASAL specimens only), is one component of a comprehensive MRSA colonization surveillance program. It is not intended to diagnose MRSA infection nor to guide or monitor treatment for MRSA infections. Test performance is not FDA approved in patients less than 52 years old. Performed at [facility], [address] [place], [place] [address]     Coagulation Studies: No results for input(s): "LABPROT", "INR" in the last 72 hours.  Urinalysis: No results for input(s): "COLORURINE", "LABSPEC", "PHURINE", "GLUCOSEU", "HGBUR", "BILIRUBINUR", "KETONESUR", "PROTEINUR", "UROBILINOGEN", "NITRITE", "LEUKOCYTESUR" in the last 72 hours.  Invalid input(s): "APPERANCEUR"    Imaging: No results found.   Medications:    anticoagulant sodium citrate      (feeding supplement) PROSource Plus  30 mL Oral TID BM   amLODipine  10 mg Oral q morning   calcium acetate  667 mg Oral TID WC   carbamazepine  1,000 mg Oral q AM   Chlorhexidine Gluconate Cloth  6 each Topical Daily   cloNIDine  0.2 mg Transdermal Weekly   doxazosin  4 mg Oral QHS   epoetin (EPOGEN/PROCRIT) injection  10,000 Units Intravenous Q M,W,F-HD   ferrous sulfate  325 mg Oral Q breakfast   fluPHENAZine  2.5 mg Oral q AM   heparin  5,000 Units Subcutaneous  Q8H   hydrALAZINE  25 mg Oral TID   irbesartan  300 mg Oral Daily   isosorbide mononitrate  60 mg Oral BID   minoxidil  10 mg Oral Daily   mometasone-formoterol  2 puff Inhalation BID   multivitamin  1 tablet Oral QHS   nystatin  5 mL Oral QID   mouth rinse  15 mL Mouth Rinse 4 times per day   pantoprazole  40 mg Oral Daily   patiromer  16.8 g Oral Daily   polyethylene glycol  17 g Oral BID   trihexyphenidyl  2 mg Oral BID WC   albuterol, alteplase, anticoagulant sodium citrate, diphenhydrAMINE, haloperidol lactate, heparin, hydrALAZINE, lidocaine (PF), lidocaine-prilocaine, ondansetron **OR** ondansetron (ZOFRAN) IV, mouth rinse, pentafluoroprop-tetrafluoroeth, polyethylene glycol, senna-docusate  Assessment/ Plan:  Mr. Effrey Davidow is a 61 y.o.  male with past medical history including hypertension, anemia, schizophrenia and end-stage renal disease on hemodialysis.  Patient has presented to the ED with altered mental status.  Patient has been admitted for Altered mental status [R41.82] Hypertensive urgency [I16.0] Severe hypertension [I10] Altered mental status, unspecified altered mental status type [R41.82]  [facility]/MWF/Lt AVF  End-stage renal disease with hyperkalemia on hemodialysis.  Will maintain outpatient schedule if possible.  Significant volume on board still.  We are planning for hemodialysis treatment today with ultrafiltration target of 3 kg.  May need to consider extra treatment this week.  2. Anemia of chronic kidney disease Lab Results  Component Value Date   HGB 9.2 (L) 01/26/2022  Hemoglobin within acceptable target. Receiving EPO 10,000 units IV with dialysis treatments.   3. Secondary Hyperparathyroidism:  Lab Results  Component Value Date   CALCIUM 9.3 01/30/2022   PHOS 5.0 (H) 01/26/2022  Calcium and phosphorus within acceptable target. Continue calcium acetate with meals.  4.  Hypertension with chronic kidney disease.  Home regimen  includes amlodipine, clonidine, hydralazine, isosorbide, and losartan.    Patient is currently on amlodipine 10 mg p.o. daily Hydralazine 25 mg p.o. 3 times daily Irbesartan 300 mg p.o. daily Isosorbide 60 mg p.o. twice daily Doxazosin 4 mg p.o. daily Minoxidil 10 mg p.o. daily  Continue current regimen.  Blood pressure 131/79 today.   LOS: 76 [place] 9/4/202312:53 PM

## 2022-02-02 NOTE — Progress Notes (Signed)
Post HD RN assessment 

## 2022-02-03 MED ORDER — MINOXIDIL 2.5 MG PO TABS
5.0000 mg | ORAL_TABLET | Freq: Every day | ORAL | Status: DC
  Administered 2022-02-04 – 2022-02-12 (×8): 5 mg via ORAL
  Filled 2022-02-03 (×9): qty 2

## 2022-02-03 NOTE — Progress Notes (Addendum)
Physical Therapy Treatment Patient Details Name: Todd Brown MRN: [id number] DOB: 1960/12/31 Today's Date: 02/03/2022   History of Present Illness 61 year old male with past medical history of hypertension and end-stage renal disease on hemodialysis presented to the emergency room from his group home/skilled nursing facility on 7/17 with confusion.  (Patient's baseline is that he is unable to care for himself and has a legal guardian.)  Patient last received dialysis on Friday, 7/14.  From being in the emergency room on 7/17, unable to get dialysis yesterday.  Patient found to have markedly elevated blood pressure with a systolic in the 878M.  Patient with questionable cardiac ischemia as well.    PT Comments    Co-tx with OT for improved mobility.  1 unit billed PT per protocol.  Pt in bed,  breakfast arrived.  Agrees to OOB for breakfast.  He is able to get to EOB with min a x 1 and mod cues.  Assist mostly for getting hips to EOB and not because he physically could not do it but because of difficulty following cues.  He is steady in sitting.  Stands with min a x 2 to RW and is able to transfer to bed at recliner with cues for sequencing +2 hands on for safety and balance.  Pt is assisted to sitting in recliner and promptly begins eating breakfast.    Pt continues to need +2 for mobility for general safety but only needs light assist.  He struggled with motivation for tasks and needs encouragement to complete activities.  Pt encouraged to walk further in room but declines.   Recommendations for follow up therapy are one component of a multi-disciplinary discharge planning process, led by the attending physician.  Recommendations may be updated based on patient status, additional functional criteria and insurance authorization.  Follow Up Recommendations  Skilled nursing-short term rehab (<3 hours/day)     Assistance Recommended at Discharge Frequent or constant Supervision/Assistance   Patient can return home with the following Two people to help with walking and/or transfers;Two people to help with bathing/dressing/bathroom;Help with stairs or ramp for entrance;Assist for transportation;Assistance with cooking/housework   Equipment Recommendations  Rolling walker (2 wheels);BSC/3in1;Wheelchair cushion (measurements PT);Wheelchair (measurements PT)    Recommendations for Other Services       Precautions / Restrictions Precautions Precautions: Fall Precaution Comments: vision impaired Restrictions Weight Bearing Restrictions: No RLE Weight Bearing: Weight bearing as tolerated LLE Weight Bearing: Weight bearing as tolerated     Mobility  Bed Mobility Overal bed mobility: Needs Assistance Bed Mobility: Supine to Sit     Supine to sit: Min assist     General bed mobility comments: heavy verbal and tactile cues for hand placement and movement    Transfers Overall transfer level: Needs assistance Equipment used: Rolling walker (2 wheels) Transfers: Sit to/from Stand Sit to Stand: Min assist, +2 physical assistance, +2 safety/equipment Stand pivot transfers: Min assist, Min guard, +2 safety/equipment         General transfer comment: transfers with min a x 2 and mod verbal and tactile cues.    Ambulation/Gait Ambulation/Gait assistance: Min assist, +2 physical assistance Gait Distance (Feet): 3 Feet Assistive device: Rolling walker (2 wheels) Gait Pattern/deviations: Step-to pattern Gait velocity: decreased     General Gait Details: mod verbal and tactile cues for sequending and directions.   Stairs             Wheelchair Mobility    Modified Rankin (Stroke Patients Only)  Balance Overall balance assessment: Needs assistance Sitting-balance support: No upper extremity supported, Feet supported Sitting balance-[name]: Fair     Standing balance support: Bilateral upper extremity supported, During functional  activity Standing balance-[name]: Fair                              Cognition Arousal/Alertness: Awake/alert Behavior During Therapy: Flat affect Overall Cognitive Status: Difficult to assess                                 General Comments: pt is alert however disoriented and visually impaired.        Exercises      General Comments        Pertinent Vitals/Pain Pain Assessment Pain Assessment: No/denies pain    Home Living                          Prior Function            PT Goals (current goals can now be found in the care plan section) Progress towards PT goals: Progressing toward goals    Frequency    Min 2X/week      PT Plan Current plan remains appropriate    Co-evaluation PT/OT/SLP Co-Evaluation/Treatment: Yes   PT goals addressed during session: Mobility/safety with mobility OT goals addressed during session: ADL's and self-care      AM-PAC PT "6 Clicks" Mobility   Outcome Measure  Help needed turning from your back to your side while in a flat bed without using bedrails?: None Help needed moving from lying on your back to sitting on the side of a flat bed without using bedrails?: A Little Help needed moving to and from a bed to a chair (including a wheelchair)?: A Lot Help needed standing up from a chair using your arms (e.g., wheelchair or bedside chair)?: A Lot Help needed to walk in hospital room?: A Lot Help needed climbing 3-5 steps with a railing? : Total 6 Click Score: 14    End of Session Equipment Utilized During Treatment: Gait belt Activity Tolerance: Patient tolerated treatment well Patient left: in chair;with chair alarm set;with call bell/phone within reach Nurse Communication: Mobility status PT Visit Diagnosis: Other abnormalities of gait and mobility (R26.89);Muscle weakness (generalized) (M62.81);Unsteadiness on feet (R26.81)     Time: 1610-9604 PT Time Calculation (min)  (ACUTE ONLY): 11 min  Charges:  $Gait Training: 8-22 mins                    Chesley Noon, PTA 02/03/22, 11:57 AM

## 2022-02-03 NOTE — Progress Notes (Signed)
[facility] Progress Note  Patient: Todd Brown    HCW:[id number]  DOA: 12/15/2021    Date of Service: the patient was seen and examined on 02/03/2022  Brief hospital course: 61 year old male with past medical history of hypertension and end-stage renal disease on hemodialysis presented to the emergency room from his group home/Assisted living facility on 7/17 with confusion.  (Patient's baseline was that he is unable to care for himself and has a legal guardian.)  Patient last received dialysis on Friday, 7/14.    Patient found to have markedly elevated blood pressure with a systolic in the 176H requiring Cardene drip and placement of patient to the ICU.  Patient with questionable cardiac ischemia as well.  CT scan of head and neurological exam unremarkable.    Patient was admitted to the hospitalist service and started on his oral antihypertensives, nicardipine drip and given IV labetalol and as needed IV hydralazine.  Nephrology consulted and resumed patient's dialysis.  Over the next few weeks, blood pressure better controlled.    At times became agitated and seen by psychiatry and started on scheduled and as needed Haldol, leading to more compliance with better mentation as well as taking his antihypertensives.    Hospital course complicated by episode of periorbital swelling bilaterally felt to be secondary to subcutaneous emphysema, resolved on steroids.  Also had episode of oral thrush treated with Magic mouthwash.    Patient's assisted living facility willing to take patient back after becomes much stronger at skilled nursing facility.  Skilled nursing facility search still in progress, apparently with no bed offers thus far.  Assessment and Plan: * Acute metabolic encephalopathy-resolved as of 12/31/2021 Felt to be secondary to malignant hypertension.  Resolved and now back at baseline.  Had some increased somnolence which improved after stopping his scheduled Benadryl and  Haldol.  Hyperkalemia Potassiums have been trending upward for the past few days and patient started on Veltassa.  After dialysis on 8/28, potassium on 8/29 at 4.5.  Continue to follow.  Hypertensive emergency-resolved as of 12/31/2021 Resolved.  See below.  ESRD on dialysis [facility]) Nephrology consulted, full dialysis session following admission and again today to keep him on his Monday Wednesday Friday schedule. -Continue scheduled dialysis  Mental health disorder Unspecified.  Patient is unable to care for himself.  It is unclear if he is ever had a formal diagnosis.  He is on Tegretol and Artane which he should continue.  As needed Haldol for acute behavior agitation as above.  He is for the most part much more calm and cooperative these days over times, he intermittently refused vitals being checked or taking his medications.  Patient had a little more agitation on 8/31 night.  Adjusted Haldol as needed.  Essential hypertension Resumed on his home p.o. amlodipine 10 mg daily, clonidine 0.3 mg p.o. 3 times daily, hydralazine 100 mg p.o. 3 times daily, isosorbide mononitrate 60 mg p.o. twice daily, losartan 100 mg daily.  Pressures at time improved, but then blood pressure starts to spike again.  As above, changing ARB and adding alpha-blocker. -Nephrology added clonidine patches with p.o. clonidine. -Minoxidil was then added and titrated up to 10 mg daily.  During the first week of August, patient occasionally had episodes of lower blood pressure without any blood pressure spikes, so some of his newer medications were decreased: hydralazine and minoxidil was scaled back.  With blood pressure on the rise again, have increased minoxidil back to 10 mg and hydralazine up to 10  mg 3 times daily.  Pressures softer after dialysis and started to trend back upward the day before the next dialysis session.  Oral thrush 8/23: start nystatin suspension  Subcutaneous emphysema (HCC)-resolved as of  12/18/2021 On steroids.  Appreciate pulmonary assistance.  Subcutaneous emphysema looks to be resolved.  Changed from Solu-Medrol to p.o. prednisone quick taper.  Physical deconditioning PT OT are recommending SNF.  However, this is similar to a previous hospitalization where TOC was unable to find skilled nursing and over time, patient's strength improved that he was able to be discharged back to his assisted living facility.  Anemia of chronic disease Hbg stable. Check CBC ~weekly.  Malnutrition of moderate degree Nutrition Status: Nutrition Problem: Moderate Malnutrition Etiology: chronic illness (ESRD on HD) Signs/Symptoms: mild fat depletion, mild muscle depletion, moderate muscle depletion Interventions: Refer to RD note for recommendations, Nepro shake, MVI, Prostat       Body mass index is 27 kg/m.  Nutrition Problem: Moderate Malnutrition Etiology: chronic illness (ESRD on HD)     Consultants: Nephrology Critical care Psychiatry  Procedures: Hemodialysis   Antimicrobials: None  Code Status: Full code   Subjective: Patient tired, no complaints  Objective: Blood pressure slightly trending up this morning Vitals:   02/03/22 0334 02/03/22 0836  BP: (!) 154/83 (!) 153/78  Pulse: 65   Resp: 18 17  Temp: 97.8 F (36.6 C) 98.2 F (36.8 C)  SpO2: 99% 98%   Filed Weights   02/02/22 1244 02/02/22 1635 02/03/22 0336  Weight: 95.4 kg 93.8 kg 90.3 kg   Body mass index is 27 kg/m.  Exam: Exam unchanged, from previous day General exam: Oriented x3, no acute distress Respiratory system: Clear to auscultation bilaterally Cardiovascular system: Regular rate and rhythm, S1-S2 Gastrointestinal system: soft, nondistended, nontender, hypoactive bowel sounds Extremity: No clubbing or cyanosis, trace pitting edema Psychiatry: Withdrawn, but does not appear to be in acute psychosis   Data Reviewed: No labs today   Disposition:  Status is: Inpatient Remains  inpatient appropriate because:  --Awaiting SNF placement  Anticipated discharge date: Unknown  Family Communication: None   DVT Prophylaxis: heparin injection 5,000 Units Start: 12/15/21 1645 Place TED hose Start: 12/15/21 1629    Author: Annita Brod , MD 02/03/2022 2:24 PM  To reach On-call, see care teams to locate the attending and reach out via [email or web address]. Between 7PM-7AM, please contact night-coverage If you still have difficulty reaching the attending provider, please page the [facility] (Director on Call) for [facility] on amion for assistance.

## 2022-02-03 NOTE — Evaluation (Signed)
Occupational Therapy Re-Evaluation Patient Details Name: Todd Brown MRN: [id number] DOB: [birth year] Today's Date: 02/03/2022   History of Present Illness 61 year old male with past medical history of hypertension and end-stage renal disease on hemodialysis presented to the emergency room from his group home/skilled nursing facility on 7/17 with confusion.  (Patient's baseline is that he is unable to care for himself and has a legal guardian.)  Patient last received dialysis on Friday, 7/14.  From being in the emergency room on 7/17, unable to get dialysis yesterday.  Patient found to have markedly elevated blood pressure with a systolic in the 865H.  Patient with questionable cardiac ischemia as well.   Clinical Impression   Chart reviewed to date, pt received in bed completing self-feeding with RN present. Agreeable to OT/PT co-treatment. Re-evaluation completed on this date. Pt is showing progress towards goals, goals have been updated. Pt currently functioning at [place] A x1 for bed mobility, Min A x2 to transfer to recliner using RW, and set up A to supervision for self-feeding. Pt alert and participatory in therapy this date, however, required max multimodal cuing for sequencing of activities and safety with OOB mobility. Pt was left sitting up in recliner eating breakfast. D/C recommendation remains appropriate. OT will continue to follow acutely.        Recommendations for follow up therapy are one component of a multi-disciplinary discharge planning process, led by the attending physician.  Recommendations may be updated based on patient status, additional functional criteria and insurance authorization.   Follow Up Recommendations  Skilled nursing-short term rehab (<3 hours/day)    Assistance Recommended at Discharge Frequent or constant Supervision/Assistance  Patient can return home with the following A lot of help with bathing/dressing/bathroom;A lot of help with walking and/or  transfers;Assistance with feeding;Assist for transportation;Direct supervision/assist for medications management;Direct supervision/assist for financial management;Assistance with cooking/housework;Help with stairs or ramp for entrance    Functional Status Assessment  Patient has had a recent decline in their functional status and demonstrates the ability to make significant improvements in function in a reasonable and predictable amount of time.  Equipment Recommendations  Other (comment) (defer to next venue of care)    Recommendations for Other Services       Precautions / Restrictions Precautions Precautions: Fall Precaution Comments: vision impaired Restrictions Weight Bearing Restrictions: No RLE Weight Bearing: Weight bearing as tolerated LLE Weight Bearing: Weight bearing as tolerated      Mobility Bed Mobility Overal bed mobility: Needs Assistance Bed Mobility: Supine to Sit     Supine to sit: Min assist     General bed mobility comments: Max verbal and tactile cues for hand placement and movement    Transfers Overall transfer level: Needs assistance Equipment used: Rolling walker (2 wheels) Transfers: Sit to/from Stand Sit to Stand: Min assist, +2 physical assistance, +2 safety/equipment Stand pivot transfers: Min assist, Min guard, +2 safety/equipment         General transfer comment: transfers with min a x 2 and mod verbal and tactile cues.      Balance Overall balance assessment: Needs assistance Sitting-balance support: No upper extremity supported, Feet supported Sitting balance-[name]: Fair     Standing balance support: Bilateral upper extremity supported, During functional activity Standing balance-[name]: Fair Standing balance comment: Min A x2                           ADL either performed or assessed with clinical judgement  ADL Overall ADL's : Needs assistance/impaired Eating/Feeding: Set up;Sitting;Supervision/  safety                                   Functional mobility during ADLs: Minimal assistance;+2 for physical assistance;Rolling walker (2 wheels) (from bed > recliner)       Vision Baseline Vision/History: 2 Legally blind Patient Visual Report: No change from baseline       Perception     Praxis      Pertinent Vitals/Pain Pain Assessment Pain Assessment: No/denies pain     Hand Dominance Right   Extremity/Trunk Assessment Upper Extremity Assessment Upper Extremity Assessment: Generalized weakness   Lower Extremity Assessment Lower Extremity Assessment: Generalized weakness       Communication Communication Communication: HOH   Cognition Arousal/Alertness: Awake/alert Behavior During Therapy: Flat affect Overall Cognitive Status: Difficult to assess Area of Impairment: Orientation, Attention, Memory, Following commands, Safety/judgement, Awareness, Problem solving                 Orientation Level: Disoriented to, Situation, Time Current Attention Level: Alternating Memory: Decreased short-term memory Following Commands: Follows one step commands with increased time, Follows one step commands inconsistently Safety/Judgement: Decreased awareness of safety, Decreased awareness of deficits Awareness: Intellectual Problem Solving: Slow processing, Decreased initiation, Difficulty sequencing, Requires verbal cues, Requires tactile cues General Comments: pt is alert however disoriented and visually impaired, repetition/increased time to follow one step commands     General Comments       Exercises     Shoulder Instructions      Home Living Family/patient expects to be discharged to:: Assisted living                                 Additional Comments: pt lives at [facility], per chart and previous admissionpt SPT to bed/mwc with [facility]; Per chart review assist from ALF staff for ADL      Prior Functioning/Environment  Prior Level of Function : Needs assist             Mobility Comments: SPT to WC at baseline ADLs Comments: Per chart review assist from ALF staff for ADL        OT Problem List: Decreased strength;Decreased activity tolerance;Impaired balance (sitting and/or standing);Decreased safety awareness      OT Treatment/Interventions: Self-care/ADL training;Patient/family education;Therapeutic exercise;Energy conservation;DME and/or AE instruction;Therapeutic activities    OT Goals(Current goals can be found in the care plan section) Acute Rehab OT Goals Patient Stated Goal: get better OT Goal Formulation: With patient Time For Goal Achievement: 02/18/22 Potential to Achieve Goals: Fair  OT Frequency: Min 2X/week    Co-evaluation PT/OT/SLP Co-Evaluation/Treatment: Yes Reason for Co-Treatment: Necessary to address cognition/behavior during functional activity;For patient/therapist safety;To address functional/ADL transfers PT goals addressed during session: Mobility/safety with mobility OT goals addressed during session: ADL's and self-care      AM-PAC OT "6 Clicks" Daily Activity     Outcome Measure Help from another person eating meals?: A Little Help from another person taking care of personal grooming?: A Little Help from another person toileting, which includes using toliet, bedpan, or urinal?: A Lot Help from another person bathing (including washing, rinsing, drying)?: A Lot Help from another person to put on and taking off regular upper body clothing?: A Little Help from another person to put on and taking off  regular lower body clothing?: A Little 6 Click Score: 16   End of Session Equipment Utilized During Treatment: Oxygen;Gait belt;Rolling walker (2 wheels) Nurse Communication: Mobility status  Activity Tolerance: Patient tolerated treatment well Patient left: in chair;with call bell/phone within reach;with chair alarm set  OT Visit Diagnosis: Unsteadiness on feet  (R26.81);Muscle weakness (generalized) (M62.81);Feeding difficulties (R63.3)                Time: 4383-7793 OT Time Calculation (min): 12 min Charges:  OT General Charges $OT Visit: 1 Visit OT Evaluation $OT Re-eval: 1 Re-eval  [facility] MS, OTR/L ascom [phone]  02/03/22, 1:39 PM

## 2022-02-03 NOTE — Progress Notes (Signed)
[facility]  ROUNDING NOTE   Subjective:   Todd Brown is a 61 year old male with past medical history including hypertension, anemia, schizophrenia and end-stage renal disease on hemodialysis.  Patient has presented to the ED with altered mental status.  Patient has been admitted for Altered mental status [R41.82] Hypertensive urgency [I16.0] Severe hypertension [I10] Altered mental status, unspecified altered mental status type [R41.82]  Patient is known to our practice and receives outpatient dialysis treatments at [facility] on a MWF schedule.   Update Patient seen sitting up in chair Feeding himself breakfast Denies pain and discomfort     Objective:  Vital signs in last 24 hours:  Temp:  [97.7 F (36.5 C)-98.3 F (36.8 C)] 98.2 F (36.8 C) (09/05 0836) Pulse Rate:  [65-71] 65 (09/05 0334) Resp:  [14-21] 17 (09/05 0836) BP: (95-154)/(56-85) 153/78 (09/05 0836) SpO2:  [95 %-99 %] 98 % (09/05 0836) Weight:  [90.3 kg-93.8 kg] 90.3 kg (09/05 0336)  Weight change:  Filed Weights   02/02/22 1244 02/02/22 1635 02/03/22 0336  Weight: 95.4 kg 93.8 kg 90.3 kg    Intake/Output: I/O last 3 completed shifts: In: 240 [P.O.:240] Out: 2830 [Other:2830]   Intake/Output this shift:  No intake/output data recorded.  Physical Exam: General: NAD  Head: Normocephalic, atraumatic. Moist oral mucosal membranes  Eyes: Anicteric  Lungs:  Clear to auscultation, normal effort, room air  Heart: Regular rate and rhythm  Abdomen:  Soft, nontender, non distended  Extremities:  3+ peripheral edema.  Neurologic: Alert, answer simple questions  Skin: No lesions  Access: Lt AVF     Basic Metabolic Panel: Recent Labs  Lab 01/30/22 0945 02/02/22 1249  NA 135 130*  K 4.8 6.2*  CL 97* 90*  CO2 27 26  GLUCOSE 120* 81  BUN 99* 108*  CREATININE 6.44* 7.03*  CALCIUM 9.3 9.2  PHOS  --  6.4*     Liver Function Tests: Recent Labs  Lab 02/02/22 1249   ALBUMIN 3.0*     No results for input(s): "LIPASE", "AMYLASE" in the last 168 hours. No results for input(s): "AMMONIA" in the last 168 hours.  CBC: Recent Labs  Lab 02/02/22 1249  WBC 4.4  HGB 10.2*  HCT 30.9*  MCV 102.3*  PLT 165     Cardiac Enzymes: No results for input(s): "CKTOTAL", "CKMB", "CKMBINDEX", "TROPONINI" in the last 168 hours.  BNP: Invalid input(s): "POCBNP"  CBG: No results for input(s): "GLUCAP" in the last 168 hours.   Microbiology: Results for orders placed or performed during the hospital encounter of 12/15/21  SARS Coronavirus 2 by RT PCR (hospital order, performed in [facility] hospital lab) *cepheid single result test* Anterior Nasal Swab     Status: None   Collection Time: 12/15/21  5:50 PM   Specimen: Anterior Nasal Swab  Result Value Ref Range Status   SARS Coronavirus 2 by RT PCR NEGATIVE NEGATIVE Final    Comment: (NOTE) SARS-CoV-2 target nucleic acids are NOT DETECTED.  The SARS-CoV-2 RNA is generally detectable in upper and lower respiratory specimens during the acute phase of infection. The lowest concentration of SARS-CoV-2 viral copies this assay can detect is 250 copies / mL. A negative result does not preclude SARS-CoV-2 infection and should not be used as the sole basis for treatment or other patient management decisions.  A negative result may occur with improper specimen collection / handling, submission of specimen other than nasopharyngeal swab, presence of viral mutation(s) within the areas targeted by this  assay, and inadequate number of viral copies (<250 copies / mL). A negative result must be combined with clinical observations, patient history, and epidemiological information.  Fact Sheet for Patients:   https://www.patel.info/  Fact Sheet for Healthcare Providers: https://hall.com/  This test is not yet approved or  cleared by the [place] FDA and has been  authorized for detection and/or diagnosis of SARS-CoV-2 by FDA under an Emergency Use Authorization (EUA).  This EUA will remain in effect (meaning this test can be used) for the duration of the COVID-19 declaration under Section 564(b)(1) of the Act, 21 U.S.C. section 360bbb-3(b)(1), unless the authorization is terminated or revoked sooner.  Performed at [facility], [address] [place], [place] [address]   MRSA Next Gen by PCR, Nasal     Status: None   Collection Time: 12/15/21  9:03 PM   Specimen: Nasal Mucosa; Nasal Swab  Result Value Ref Range Status   MRSA by PCR Next Gen NOT DETECTED NOT DETECTED Final    Comment: (NOTE) The GeneXpert MRSA Assay (FDA approved for NASAL specimens only), is one component of a comprehensive MRSA colonization surveillance program. It is not intended to diagnose MRSA infection nor to guide or monitor treatment for MRSA infections. Test performance is not FDA approved in patients less than 12 years old. Performed at [facility], [address] [place], [place] [address]     Coagulation Studies: No results for input(s): "LABPROT", "INR" in the last 72 hours.  Urinalysis: No results for input(s): "COLORURINE", "LABSPEC", "PHURINE", "GLUCOSEU", "HGBUR", "BILIRUBINUR", "KETONESUR", "PROTEINUR", "UROBILINOGEN", "NITRITE", "LEUKOCYTESUR" in the last 72 hours.  Invalid input(s): "APPERANCEUR"    Imaging: No results found.   Medications:    anticoagulant sodium citrate      (feeding supplement) PROSource Plus  30 mL Oral TID BM   amLODipine  10 mg Oral q morning   calcium acetate  667 mg Oral TID WC   carbamazepine  1,000 mg Oral q AM   Chlorhexidine Gluconate Cloth  6 each Topical Daily   cloNIDine  0.2 mg Transdermal Weekly   doxazosin  4 mg Oral QHS   epoetin (EPOGEN/PROCRIT) injection  10,000 Units Intravenous Q M,W,F-HD   ferrous sulfate  325 mg Oral Q breakfast   fluPHENAZine  2.5 mg Oral q AM   heparin   5,000 Units Subcutaneous Q8H   hydrALAZINE  25 mg Oral TID   irbesartan  300 mg Oral Daily   isosorbide mononitrate  60 mg Oral BID   [START ON 02/04/2022] minoxidil  5 mg Oral Daily   mometasone-formoterol  2 puff Inhalation BID   multivitamin  1 tablet Oral QHS   nystatin  5 mL Oral QID   mouth rinse  15 mL Mouth Rinse 4 times per day   pantoprazole  40 mg Oral Daily   patiromer  16.8 g Oral Daily   polyethylene glycol  17 g Oral BID   trihexyphenidyl  2 mg Oral BID WC   albuterol, alteplase, anticoagulant sodium citrate, diphenhydrAMINE, haloperidol lactate, heparin, hydrALAZINE, lidocaine (PF), lidocaine-prilocaine, ondansetron **OR** ondansetron (ZOFRAN) IV, mouth rinse, pentafluoroprop-tetrafluoroeth, polyethylene glycol, senna-docusate  Assessment/ Plan:  [name] is a 61 y.o.  male with past medical history including hypertension, anemia, schizophrenia and end-stage renal disease on hemodialysis.  Patient has presented to the ED with altered mental status.  Patient has been admitted for Altered mental status [R41.82] Hypertensive urgency [I16.0] Severe hypertension [I10] Altered mental status, unspecified altered mental status  type [R41.82]  [facility]/MWF/Lt AVF  End-stage renal disease with hyperkalemia on hemodialysis.  Will maintain outpatient schedule if possible.   Will attempt increased fluid removal during treatment tomorrow. Next treatment scheduled for Wednesday.  2. Anemia of chronic kidney disease Lab Results  Component Value Date   HGB 10.2 (L) 02/02/2022  Hgb at goal Will decrease EPO to 4000 units with dialysis.    3. Secondary Hyperparathyroidism:  Lab Results  Component Value Date   CALCIUM 9.2 02/02/2022   PHOS 6.4 (H) 02/02/2022   Continue calcium acetate with meals.  4.  Hypertension with chronic kidney disease.  Home regimen includes amlodipine, clonidine, hydralazine, isosorbide, and losartan.    Patient is currently on  amlodipine 10 mg p.o. daily Hydralazine 25 mg p.o. 3 times daily Irbesartan 300 mg p.o. daily Isosorbide 60 mg p.o. twice daily Doxazosin 4 mg p.o. daily Reducing to Minoxidil 5 mg p.o. daily  Blood pressure soft with dialysis. Will reduce Minoxidil and assess.    LOS: [place] 9/5/20232:09 PM

## 2022-02-04 DIAGNOSIS — G9341 Metabolic encephalopathy: Secondary | ICD-10-CM | POA: Diagnosis not present

## 2022-02-04 LAB — RENAL FUNCTION PANEL
Albumin: 3 g/dL — ABNORMAL LOW (ref 3.5–5.0)
Anion gap: 13 (ref 5–15)
BUN: 94 mg/dL — ABNORMAL HIGH (ref 8–23)
CO2: 25 mmol/L (ref 22–32)
Calcium: 9.1 mg/dL (ref 8.9–10.3)
Chloride: 90 mmol/L — ABNORMAL LOW (ref 98–111)
Creatinine, Ser: 6.29 mg/dL — ABNORMAL HIGH (ref 0.61–1.24)
GFR, Estimated: 9 mL/min — ABNORMAL LOW (ref >60)
Glucose, Bld: 109 mg/dL — ABNORMAL HIGH (ref 70–99)
Phosphorus: 6.5 mg/dL — ABNORMAL HIGH (ref 2.5–4.6)
Potassium: 5 mmol/L (ref 3.5–5.1)
Sodium: 128 mmol/L — ABNORMAL LOW (ref 135–145)

## 2022-02-04 LAB — CBC WITH DIFFERENTIAL/PLATELET
Abs Immature Granulocytes: 0.01 10*3/uL (ref 0.00–0.07)
Basophils Absolute: 0 10*3/uL (ref 0.0–0.1)
Basophils Relative: 1 %
Eosinophils Absolute: 0.1 10*3/uL (ref 0.0–0.5)
Eosinophils Relative: 2 %
HCT: 31.6 % — ABNORMAL LOW (ref 39.0–52.0)
Hemoglobin: 10.9 g/dL — ABNORMAL LOW (ref 13.0–17.0)
Immature Granulocytes: 0 %
Lymphocytes Relative: 17 %
Lymphs Abs: 0.7 10*3/uL (ref 0.7–4.0)
MCH: 34.6 pg — ABNORMAL HIGH (ref 26.0–34.0)
MCHC: 34.5 g/dL (ref 30.0–36.0)
MCV: 100.3 fL — ABNORMAL HIGH (ref 80.0–100.0)
Monocytes Absolute: 0.5 10*3/uL (ref 0.1–1.0)
Monocytes Relative: 14 %
Neutro Abs: 2.5 10*3/uL (ref 1.7–7.7)
Neutrophils Relative %: 66 %
Platelets: 167 10*3/uL (ref 150–400)
RBC: 3.15 MIL/uL — ABNORMAL LOW (ref 4.22–5.81)
RDW: 14.8 % (ref 11.5–15.5)
WBC: 3.8 10*3/uL — ABNORMAL LOW (ref 4.0–10.5)
nRBC: 0 % (ref 0.0–0.2)

## 2022-02-04 MED ORDER — EPOETIN ALFA 10000 UNIT/ML IJ SOLN
INTRAMUSCULAR | Status: AC
  Administered 2022-02-04: 10000 [IU] via INTRAVENOUS
  Filled 2022-02-04: qty 1

## 2022-02-04 MED ORDER — EPOETIN ALFA 10000 UNIT/ML IJ SOLN
4000.0000 [IU] | INTRAMUSCULAR | Status: DC
  Administered 2022-02-06 – 2022-02-13 (×3): 4000 [IU] via INTRAVENOUS
  Filled 2022-02-04 (×4): qty 1

## 2022-02-04 NOTE — Progress Notes (Signed)
Pt did 3.5 hrs of HD Tolerated well with no issues or distress  UF = 3500 ml  Bp 128/65 Hr 66 O2sat 99 on room air Temp 97.6 Rr 17  Fistula bruit and thrill present Report given to floor R.N.

## 2022-02-04 NOTE — Progress Notes (Signed)
PROGRESS NOTE    Todd Brown   QIW:[id number] DOB: [birth year]  DOA: 12/15/2021 Date of Service: 02/04/22 PCP: Jodi Marble, MD     Brief Narrative / Hospital Course:  61 year old male with past medical history of hypertension and end-stage renal disease on hemodialysis presented to the emergency room from his group home/Assisted living facility on 7/17 with confusion.  (Patient's baseline was that he is unable to care for himself and has a legal guardian.)  Patient last received dialysis on Friday, 7/14.     Patient found to have markedly elevated blood pressure with a systolic in the 417E requiring Cardene drip and placement of patient to the ICU.  Patient with questionable cardiac ischemia as well.  CT scan of head and neurological exam unremarkable.     Patient was admitted to the hospitalist service and started on his oral antihypertensives, nicardipine drip and given IV labetalol and as needed IV hydralazine.  Nephrology consulted and resumed patient's dialysis.  Over the next few weeks, blood pressure better controlled.     At times became agitated and seen by psychiatry and started on scheduled and as needed Haldol, leading to more compliance with better mentation as well as taking his antihypertensives.     Hospital course complicated by episode of periorbital swelling bilaterally felt to be secondary to subcutaneous emphysema, resolved on steroids.  Also had episode of oral thrush treated with Magic mouthwash.     Patient's assisted living facility willing to take patient back after becomes much stronger at skilled nursing facility.  Skilled nursing facility search still in progress, apparently with no bed offers thus far.   Consultants: Nephrology Critical care Psychiatry   Procedures: Hemodialysis      ASSESSMENT & PLAN:   Active Problems:   Hyperkalemia   ESRD on dialysis [facility])   Mental health disorder   Essential hypertension   Oral thrush   Physical  deconditioning   Malnutrition of moderate degree   Anemia of chronic disease  Hyperkalemia After dialysis on 8/28, potassium on 8/29 at 4.5.   Continue to follow.   Hypertensive emergency-resolved as of 12/31/2021 Resolved.  See below.   ESRD on dialysis [facility]) Nephrology consulted Continue scheduled dialysis   Mental health disorder Unspecified.  It is unclear if he is ever had a formal diagnosis.   Patient is unable to care for himself.   He is for the most part much more calm and cooperative these days over times, he intermittently refused vitals being checked or taking his medications. He is on Tegretol and Artane which he should continue.   As needed Haldol for acute behavior agitation as above.     Essential hypertension Resumed on his home p.o. amlodipine 10 mg daily, clonidine 0.3 mg p.o. 3 times daily, hydralazine 100 mg p.o. 3 times daily, isosorbide mononitrate 60 mg p.o. twice daily, losartan 100 mg daily.   Pressures at time improved, but then blood pressure starts to spike again. Nephrology added clonidine patches with p.o. clonidine. Minoxidil was then added and titrated up to 10 mg daily.   Oral thrush 8/23: start nystatin suspension   Emphysema (HCC)-resolved as of 12/18/2021 On steroids.  Appreciate pulmonary assistance Changed from Solu-Medrol to p.o. prednisone quick taper.   Physical deconditioning PT OT are recommending SNF.  However, this is similar to a previous hospitalization where TOC was unable to find skilled nursing and over time, patient's strength improved that he was able to be discharged back to his assisted  living facility. PT/OT to follow however assistd living is refusing to take him back until stint in SNF TOC following    Anemia of chronic disease Hbg stable. Check CBC ~weekly.   Malnutrition of moderate degree Nutrition Status: Nutrition Problem: Moderate Malnutrition Etiology: chronic illness (ESRD on HD) Signs/Symptoms: mild fat  depletion, mild muscle depletion, moderate muscle depletion Interventions: Refer to RD note for recommendations, Nepro shake, MVI, Prostat Body mass index is 27 kg/m.  Nutrition Problem: Moderate Malnutrition Etiology: chronic illness (ESRD on HD)       DVT prophylaxis: heparin Pertinent IV fluids/nutrition: n/a Central lines / invasive devices: n/a  Code Status: FULL Family Communication: none at this time   Disposition: inpatient TOC needs: pending SNF Barriers to discharge / significant pending items: placement issue             Subjective:  Patient has no complaints today.        Objective:  Vitals:   02/04/22 1100 02/04/22 1130 02/04/22 1158 02/04/22 1300  BP: (!) 142/72 (!) 140/78 132/70 (!) 151/77  Pulse: 66   (!) 101  Resp: 20 16  15   Temp:   97.6 F (36.4 C) 97.7 F (36.5 C)  TempSrc:   Oral Oral  SpO2:  100%  100%  Weight:      Height:        Intake/Output Summary (Last 24 hours) at 02/04/2022 1417 Last data filed at 02/04/2022 1200 Gross per 24 hour  Intake --  Output 3500 ml  Net -3500 ml   Filed Weights   02/03/22 0336 02/04/22 0730 02/04/22 0815  Weight: 90.3 kg 94.5 kg 94.5 kg    Examination:  Constitutional:  VS as above General Appearance: NAD Ears, Nose, Mouth, Throat: Normal external appearance MMM Neck: No masses, trachea midline Respiratory: Normal respiratory effort Cardiovascular: No lower extremity edema Musculoskeletal:  No clubbing/cyanosis of digits        Scheduled Medications:   (feeding supplement) PROSource Plus  30 mL Oral TID BM   amLODipine  10 mg Oral q morning   calcium acetate  667 mg Oral TID WC   carbamazepine  1,000 mg Oral q AM   Chlorhexidine Gluconate Cloth  6 each Topical Daily   cloNIDine  0.2 mg Transdermal Weekly   doxazosin  4 mg Oral QHS   [START ON 02/06/2022] epoetin (EPOGEN/PROCRIT) injection  4,000 Units Intravenous Q M,W,F-HD   ferrous sulfate  325 mg Oral Q breakfast    fluPHENAZine  2.5 mg Oral q AM   heparin  5,000 Units Subcutaneous Q8H   hydrALAZINE  25 mg Oral TID   irbesartan  300 mg Oral Daily   isosorbide mononitrate  60 mg Oral BID   minoxidil  5 mg Oral Daily   mometasone-formoterol  2 puff Inhalation BID   multivitamin  1 tablet Oral QHS   mouth rinse  15 mL Mouth Rinse 4 times per day   pantoprazole  40 mg Oral Daily   patiromer  16.8 g Oral Daily   polyethylene glycol  17 g Oral BID   trihexyphenidyl  2 mg Oral BID WC    Continuous Infusions:  anticoagulant sodium citrate      PRN Medications:  albuterol, alteplase, anticoagulant sodium citrate, diphenhydrAMINE, haloperidol lactate, heparin, hydrALAZINE, lidocaine (PF), lidocaine-prilocaine, ondansetron **OR** ondansetron (ZOFRAN) IV, mouth rinse, pentafluoroprop-tetrafluoroeth, polyethylene glycol, senna-docusate  Antimicrobials:  Anti-infectives (From admission, onward)    None       Data Reviewed: I have  personally reviewed following labs and imaging studies  CBC: Recent Labs  Lab 02/02/22 1249 02/04/22 0822  WBC 4.4 3.8*  NEUTROABS  --  2.5  HGB 10.2* 10.9*  HCT 30.9* 31.6*  MCV 102.3* 100.3*  PLT 165 093   Basic Metabolic Panel: Recent Labs  Lab 01/30/22 0945 02/02/22 1249 02/04/22 0822  NA 135 130* 128*  K 4.8 6.2* 5.0  CL 97* 90* 90*  CO2 27 26 25   GLUCOSE 120* 81 109*  BUN 99* 108* 94*  CREATININE 6.44* 7.03* 6.29*  CALCIUM 9.3 9.2 9.1  PHOS  --  6.4* 6.5*   GFR: Estimated Creatinine Clearance: 14.7 mL/min (A) (by C-G formula based on SCr of 6.29 mg/dL (H)). Liver Function Tests: Recent Labs  Lab 02/02/22 1249 02/04/22 0822  ALBUMIN 3.0* 3.0*   No results for input(s): "LIPASE", "AMYLASE" in the last 168 hours. No results for input(s): "AMMONIA" in the last 168 hours. Coagulation Profile: No results for input(s): "INR", "PROTIME" in the last 168 hours. Cardiac Enzymes: No results for input(s): "CKTOTAL", "CKMB", "CKMBINDEX", "TROPONINI"  in the last 168 hours. BNP (last 3 results) No results for input(s): "PROBNP" in the last 8760 hours. HbA1C: No results for input(s): "HGBA1C" in the last 72 hours. CBG: No results for input(s): "GLUCAP" in the last 168 hours. Lipid Profile: No results for input(s): "CHOL", "HDL", "LDLCALC", "TRIG", "CHOLHDL", "LDLDIRECT" in the last 72 hours. Thyroid Function Tests: No results for input(s): "TSH", "T4TOTAL", "FREET4", "T3FREE", "THYROIDAB" in the last 72 hours. Anemia Panel: No results for input(s): "VITAMINB12", "FOLATE", "FERRITIN", "TIBC", "IRON", "RETICCTPCT" in the last 72 hours. Urine analysis: No results found for: "COLORURINE", "APPEARANCEUR", "LABSPEC", "PHURINE", "GLUCOSEU", "HGBUR", "BILIRUBINUR", "KETONESUR", "PROTEINUR", "UROBILINOGEN", "NITRITE", "LEUKOCYTESUR" Sepsis Labs: @LABRCNTIP (procalcitonin:4,lacticidven:4)  No results found for this or any previous visit (from the past 240 hour(s)).       Radiology Studies: CT HEAD WO CONTRAST (5MM)  Result Date: 12/15/2021 CLINICAL DATA:  Altered mental status EXAM: CT HEAD WITHOUT CONTRAST TECHNIQUE: Contiguous axial images were obtained from the base of the skull through the vertex without intravenous contrast. RADIATION DOSE REDUCTION: This exam was performed according to the departmental dose-optimization program which includes automated exposure control, adjustment of the mA and/or kV according to patient size and/or use of iterative reconstruction technique. COMPARISON:  None Available. FINDINGS: Brain: No evidence of acute cortical infarction, hemorrhage, cerebral edema, mass, mass effect, or midline shift. No hydrocephalus or extra-axial fluid collection. 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 Periventricular white matter changes, likely the sequela of chronic small vessel ischemic disease. Vascular: No hyperdense vessel. Skull: Normal. Negative for fracture or focal lesion.  Sinuses/Orbits: No acute finding. Other: The mastoid air cells are well aerated. IMPRESSION: No acute intracranial process. Electronically Signed   By: Merilyn Baba M.D.   On: 12/15/2021 14:22   DG Chest Portable 1 View  Result Date: 12/15/2021 CLINICAL DATA:  Altered mental status. EXAM: PORTABLE CHEST 1 VIEW COMPARISON:  None Available. FINDINGS: Mild cardiomegaly is noted. Right internal jugular dialysis catheter is noted with distal tip in expected position of cavoatrial junction. Both lungs are clear. The visualized skeletal structures are unremarkable. IMPRESSION: No active disease. Electronically Signed   By: Marijo Conception M.D.   On: 12/15/2021 14:18            LOS: 51 days    Time spent: 25 min    Emeterio Reeve, DO [facility] 02/04/2022, 2:17 PM  Staff may message me via secure chat in Epic  but this may not receive immediate response,  please page for urgent matters!  If 7PM-7AM, please contact night-coverage [email or web address]  Dictation software was used to generate the above note. Typos may occur and escape review, as with typed/written notes. Please contact Dr Chapman Matteucci directly for clarity if needed.     

## 2022-02-04 NOTE — Progress Notes (Signed)
[facility]  ROUNDING NOTE   Subjective:   Todd Brown is a 61 year old male with past medical history including hypertension, anemia, schizophrenia and end-stage renal disease on hemodialysis.  Patient has presented to the ED with altered mental status.  Patient has been admitted for Altered mental status [R41.82] Hypertensive urgency [I16.0] Severe hypertension [I10] Altered mental status, unspecified altered mental status type [R41.82]  Patient is known to our practice and receives outpatient dialysis treatments at [facility] on a MWF schedule.   Update Patient seen and evaluated during dialysis   HEMODIALYSIS FLOWSHEET:  Blood Flow Rate (mL/min): 400 mL/min Arterial Pressure (mmHg): -150 mmHg Venous Pressure (mmHg): 300 mmHg TMP (mmHg): 10 mmHg Ultrafiltration Rate (mL/min): 1282 mL/min Dialysate Flow Rate (mL/min): 300 ml/min Dialysis Fluid Bolus: Normal Saline Bolus Amount (mL): 300 mL  Tolerating treatment well. Breakfast tray at bedside, awaiting assistance     Objective:  Vital signs in last 24 hours:  Temp:  [97.6 F (36.4 C)-98.4 F (36.9 C)] 97.6 F (36.4 C) (09/06 1158) Pulse Rate:  [62-80] 66 (09/06 1100) Resp:  [12-20] 16 (09/06 1130) BP: (120-177)/(69-101) 132/70 (09/06 1158) SpO2:  [86 %-100 %] 100 % (09/06 1130) Weight:  [94.5 kg] 94.5 kg (09/06 0815)  Weight change:  Filed Weights   02/03/22 0336 02/04/22 0730 02/04/22 0815  Weight: 90.3 kg 94.5 kg 94.5 kg    Intake/Output: No intake/output data recorded.   Intake/Output this shift:  Total I/O In: -  Out: 3500 [Other:3500]  Physical Exam: General: NAD  Head: Normocephalic, atraumatic. Moist oral mucosal membranes  Eyes: Anicteric  Lungs:  Clear to auscultation, normal effort, room air  Heart: Regular rate and rhythm  Abdomen:  Soft, nontender, non distended  Extremities:  3+ peripheral edema.  Neurologic: Alert, answer simple questions  Skin: No lesions   Access: Lt AVF     Basic Metabolic Panel: Recent Labs  Lab 01/30/22 0945 02/02/22 1249 02/04/22 0822  NA 135 130* 128*  K 4.8 6.2* 5.0  CL 97* 90* 90*  CO2 27 26 25   GLUCOSE 120* 81 109*  BUN 99* 108* 94*  CREATININE 6.44* 7.03* 6.29*  CALCIUM 9.3 9.2 9.1  PHOS  --  6.4* 6.5*     Liver Function Tests: Recent Labs  Lab 02/02/22 1249 02/04/22 0822  ALBUMIN 3.0* 3.0*     No results for input(s): "LIPASE", "AMYLASE" in the last 168 hours. No results for input(s): "AMMONIA" in the last 168 hours.  CBC: Recent Labs  Lab 02/02/22 1249 02/04/22 0822  WBC 4.4 3.8*  NEUTROABS  --  2.5  HGB 10.2* 10.9*  HCT 30.9* 31.6*  MCV 102.3* 100.3*  PLT 165 167     Cardiac Enzymes: No results for input(s): "CKTOTAL", "CKMB", "CKMBINDEX", "TROPONINI" in the last 168 hours.  BNP: Invalid input(s): "POCBNP"  CBG: No results for input(s): "GLUCAP" in the last 168 hours.   Microbiology: Results for orders placed or performed during the hospital encounter of 12/15/21  SARS Coronavirus 2 by RT PCR (hospital order, performed in [facility] hospital lab) *cepheid single result test* Anterior Nasal Swab     Status: None   Collection Time: 12/15/21  5:50 PM   Specimen: Anterior Nasal Swab  Result Value Ref Range Status   SARS Coronavirus 2 by RT PCR NEGATIVE NEGATIVE Final    Comment: (NOTE) SARS-CoV-2 target nucleic acids are NOT DETECTED.  The SARS-CoV-2 RNA is generally detectable in upper and lower respiratory specimens during the acute  phase of infection. The lowest concentration of SARS-CoV-2 viral copies this assay can detect is 250 copies / mL. A negative result does not preclude SARS-CoV-2 infection and should not be used as the sole basis for treatment or other patient management decisions.  A negative result may occur with improper specimen collection / handling, submission of specimen other than nasopharyngeal swab, presence of viral mutation(s) within  the areas targeted by this assay, and inadequate number of viral copies (<250 copies / mL). A negative result must be combined with clinical observations, patient history, and epidemiological information.  Fact Sheet for Patients:   https://www.patel.info/  Fact Sheet for Healthcare Providers: https://hall.com/  This test is not yet approved or  cleared by the [place] FDA and has been authorized for detection and/or diagnosis of SARS-CoV-2 by FDA under an Emergency Use Authorization (EUA).  This EUA will remain in effect (meaning this test can be used) for the duration of the COVID-19 declaration under Section 564(b)(1) of the Act, 21 U.S.C. section 360bbb-3(b)(1), unless the authorization is terminated or revoked sooner.  Performed at [facility], [address] [place], [place] [address]   MRSA Next Gen by PCR, Nasal     Status: None   Collection Time: 12/15/21  9:03 PM   Specimen: Nasal Mucosa; Nasal Swab  Result Value Ref Range Status   MRSA by PCR Next Gen NOT DETECTED NOT DETECTED Final    Comment: (NOTE) The GeneXpert MRSA Assay (FDA approved for NASAL specimens only), is one component of a comprehensive MRSA colonization surveillance program. It is not intended to diagnose MRSA infection nor to guide or monitor treatment for MRSA infections. Test performance is not FDA approved in patients less than 64 years old. Performed at [facility], [address] [place], [place] [address]     Coagulation Studies: No results for input(s): "LABPROT", "INR" in the last 72 hours.  Urinalysis: No results for input(s): "COLORURINE", "LABSPEC", "PHURINE", "GLUCOSEU", "HGBUR", "BILIRUBINUR", "KETONESUR", "PROTEINUR", "UROBILINOGEN", "NITRITE", "LEUKOCYTESUR" in the last 72 hours.  Invalid input(s): "APPERANCEUR"    Imaging: No results found.   Medications:    anticoagulant sodium citrate       (feeding supplement) PROSource Plus  30 mL Oral TID BM   amLODipine  10 mg Oral q morning   calcium acetate  667 mg Oral TID WC   carbamazepine  1,000 mg Oral q AM   Chlorhexidine Gluconate Cloth  6 each Topical Daily   cloNIDine  0.2 mg Transdermal Weekly   doxazosin  4 mg Oral QHS   epoetin (EPOGEN/PROCRIT) injection  10,000 Units Intravenous Q M,W,F-HD   ferrous sulfate  325 mg Oral Q breakfast   fluPHENAZine  2.5 mg Oral q AM   heparin  5,000 Units Subcutaneous Q8H   hydrALAZINE  25 mg Oral TID   irbesartan  300 mg Oral Daily   isosorbide mononitrate  60 mg Oral BID   minoxidil  5 mg Oral Daily   mometasone-formoterol  2 puff Inhalation BID   multivitamin  1 tablet Oral QHS   nystatin  5 mL Oral QID   mouth rinse  15 mL Mouth Rinse 4 times per day   pantoprazole  40 mg Oral Daily   patiromer  16.8 g Oral Daily   polyethylene glycol  17 g Oral BID   trihexyphenidyl  2 mg Oral BID WC   albuterol, alteplase, anticoagulant sodium citrate, diphenhydrAMINE, haloperidol lactate, heparin, hydrALAZINE, lidocaine (PF), lidocaine-prilocaine, ondansetron **OR** ondansetron (  ZOFRAN) IV, mouth rinse, pentafluoroprop-tetrafluoroeth, polyethylene glycol, senna-docusate  Assessment/ Plan:  [name] is a 61 y.o.  male with past medical history including hypertension, anemia, schizophrenia and end-stage renal disease on hemodialysis.  Patient has presented to the ED with altered mental status.  Patient has been admitted for Altered mental status [R41.82] Hypertensive urgency [I16.0] Severe hypertension [I10] Altered mental status, unspecified altered mental status type [R41.82]  [facility]/MWF/Lt AVF  End-stage renal disease with hyperkalemia on hemodialysis.  Will maintain outpatient schedule if possible.   Receiving dialysis today, increased UF goal to 3.5 L.  We will plan to dialyze patient tomorrow, UF only treatment, for additional fluid removal.  2. Anemia of chronic  kidney disease Lab Results  Component Value Date   HGB 10.9 (L) 02/04/2022  Hgb at goal Will decrease EPO to 4000 units with next dialysis treatment.   3. Secondary Hyperparathyroidism:  Lab Results  Component Value Date   CALCIUM 9.1 02/04/2022   PHOS 6.5 (H) 02/04/2022  Calcium within acceptable range however phosphorus elevated.  This is likely due to prescribed diet.  We will continue to monitor and attempt to manage with dialysis. Continue calcium acetate with meals.  4.  Hypertension with chronic kidney disease.  Home regimen includes amlodipine, clonidine, hydralazine, isosorbide, and losartan.  Blood pressure currently 132/70.  Patient is currently on amlodipine 10 mg p.o. daily Hydralazine 25 mg p.o. 3 times daily Irbesartan 300 mg p.o. daily Isosorbide 60 mg p.o. twice daily Doxazosin 4 mg p.o. daily  Minoxidil 5 mg p.o. daily  Blood pressure elevated during dialysis, which will allow additional fluid removal.   LOS: Raymond 9/6/202312:30 PM

## 2022-02-04 NOTE — Progress Notes (Signed)
PT Cancellation Note  Patient Details Name: Todd Brown MRN: [id number] DOB: Oct 13, 1960   Cancelled Treatment:    Reason Eval/Treat Not Completed: Patient at procedure or test/unavailable Checked in to treat pt but pt was unavailable due to hemodialysis.  [name], PTA  02/04/22, 1:53 PM

## 2022-02-04 NOTE — Progress Notes (Signed)
Increased patient uf goal to 3500 per NP Request during rounds, patient is sitting up right eating breakfast at this time moment. Will continue to monitor closely.

## 2022-02-04 NOTE — Hospital Course (Addendum)
61 year old male with past medical history of hypertension and end-stage renal disease on hemodialysis presented to the emergency room from his group home/Assisted living facility on 7/17 with confusion.  (Patient's baseline was that he is unable to care for himself and has a legal guardian.)  Patient last received dialysis on Friday, 7/14.     Patient found to have markedly elevated blood pressure with a systolic in the 027O requiring Cardene drip and placement of patient to the ICU.  Patient with questionable cardiac ischemia as well.  CT scan of head and neurological exam unremarkable.     Patient was admitted to the hospitalist service and started on his oral antihypertensives, nicardipine drip and given IV labetalol and as needed IV hydralazine.  Nephrology consulted and resumed patient's dialysis.  Over the next few weeks, blood pressure better controlled.     At times became agitated and seen by psychiatry and started on scheduled and as needed Haldol, leading to more compliance with better mentation as well as taking his antihypertensives.     Hospital course complicated by episode of periorbital swelling bilaterally felt to be secondary to subcutaneous emphysema, resolved on steroids.  Also had episode of oral thrush treated with Magic mouthwash.     Patient's assisted living facility willing to take patient back after becomes much stronger at skilled nursing facility.  Skilled nursing facility search still in progress, apparently with no bed offers thus far.   Consultants: Nephrology Critical care Psychiatry   Procedures: Hemodialysis      ASSESSMENT & PLAN:   Active Problems:   Hyperkalemia   ESRD on dialysis [facility])   Mental health disorder   Essential hypertension   Oral thrush   Physical deconditioning   Malnutrition of moderate degree   Anemia of chronic disease  Hyperkalemia After dialysis on 8/28, potassium on 8/29 at 4.5.   Continue to follow.   Hypertensive  emergency-resolved as of 12/31/2021 Resolved.  See below.   ESRD on dialysis [facility]) Nephrology consulted Continue scheduled dialysis   Mental health disorder Unspecified.  It is unclear if he is ever had a formal diagnosis.   Patient is unable to care for himself.   He is for the most part much more calm and cooperative these days over times, he intermittently refused vitals being checked or taking his medications. He is on Tegretol and Artane which he should continue.   As needed Haldol for acute behavior agitation as above.     Essential hypertension Resumed on his home p.o. amlodipine 10 mg daily, clonidine 0.3 mg p.o. 3 times daily, hydralazine 100 mg p.o. 3 times daily, isosorbide mononitrate 60 mg p.o. twice daily, losartan 100 mg daily.   Pressures at time improved, but then blood pressure starts to spike again. Nephrology added clonidine patches with p.o. clonidine. Minoxidil was then added and titrated up to 10 mg daily.   Oral thrush 8/23: start nystatin suspension   Emphysema (HCC)-resolved as of 12/18/2021 On steroids.  Appreciate pulmonary assistance Changed from Solu-Medrol to p.o. prednisone quick taper.   Physical deconditioning PT OT are recommending SNF.  However, this is similar to a previous hospitalization where TOC was unable to find skilled nursing and over time, patient's strength improved that he was able to be discharged back to his assisted living facility. PT/OT to follow however assistd living is refusing to take him back until stint in SNF TOC following    Anemia of chronic disease Hbg stable. Check CBC ~weekly.  Malnutrition of moderate degree Nutrition Status: Nutrition Problem: Moderate Malnutrition Etiology: chronic illness (ESRD on HD) Signs/Symptoms: mild fat depletion, mild muscle depletion, moderate muscle depletion Interventions: Refer to RD note for recommendations, Nepro shake, MVI, Prostat Body mass index is 27 kg/m.  Nutrition  Problem: Moderate Malnutrition Etiology: chronic illness (ESRD on HD)       DVT prophylaxis: heparin Pertinent IV fluids/nutrition: n/a Central lines / invasive devices: n/a  Code Status: FULL Family Communication: none at this time   Disposition: inpatient TOC needs: pending SNF Barriers to discharge / significant pending items: placement issue

## 2022-02-05 DIAGNOSIS — G9341 Metabolic encephalopathy: Secondary | ICD-10-CM | POA: Diagnosis not present

## 2022-02-05 LAB — BASIC METABOLIC PANEL
Anion gap: 12 (ref 5–15)
BUN: 69 mg/dL — ABNORMAL HIGH (ref 8–23)
CO2: 27 mmol/L (ref 22–32)
Calcium: 8.6 mg/dL — ABNORMAL LOW (ref 8.9–10.3)
Chloride: 93 mmol/L — ABNORMAL LOW (ref 98–111)
Creatinine, Ser: 5.48 mg/dL — ABNORMAL HIGH (ref 0.61–1.24)
GFR, Estimated: 11 mL/min — ABNORMAL LOW (ref >60)
Glucose, Bld: 80 mg/dL (ref 70–99)
Potassium: 4.6 mmol/L (ref 3.5–5.1)
Sodium: 132 mmol/L — ABNORMAL LOW (ref 135–145)

## 2022-02-05 NOTE — Progress Notes (Signed)
OT Cancellation Note  Patient Details Name: Todd Brown MRN: [id number] DOB: 07-20-1960   Cancelled Treatment:    Reason Eval/Treat Not Completed: Patient at procedure or test/ unavailable. Pt out of the room for dialysis. Will re-attempt OT tx at later date/time as pt is available.   Ardeth Perfect., MPH, MS, OTR/L ascom [phone] 02/05/22, 8:25 AM

## 2022-02-05 NOTE — Progress Notes (Signed)
Pt 3 hour UF treatment complete w/ no complications. Report to primary RN. Start: 0808 End: 1194 (see notes, tx paused d/t machine issues) 3037ml fluid removed 48L BVP 89.6kg post bed weight No meds given

## 2022-02-05 NOTE — Progress Notes (Signed)
Post HD RN assessment 

## 2022-02-05 NOTE — Progress Notes (Signed)
PT Cancellation Note  Patient Details Name: Todd Brown MRN: [id number] DOB: 11/12/1960   Cancelled Treatment:    Reason Eval/Treat Not Completed: Patient at procedure or test/unavailable  Pt at dialysis this am.  Will schedule for re-eval next session.   Chesley Noon 02/05/2022, 10:15 AM

## 2022-02-05 NOTE — Progress Notes (Signed)
PROGRESS NOTE    Todd Brown   CXK:[id number] DOB: 08-13-1960  DOA: 12/15/2021 Date of Service: 02/05/22 PCP: Jodi Marble, MD     Brief Narrative / Hospital Course:  61 year old male with past medical history of hypertension and end-stage renal disease on hemodialysis presented to the emergency room from his group home/Assisted living facility on 7/17 with confusion.  (Patient's baseline was that he is unable to care for himself and has a legal guardian.)  Patient last received dialysis on Friday, 7/14.     Patient found to have markedly elevated blood pressure with a systolic in the 970Y requiring Cardene drip and placement of patient to the ICU.  Patient with questionable cardiac ischemia as well.  CT scan of head and neurological exam unremarkable.     Patient was admitted to the hospitalist service and started on his oral antihypertensives, nicardipine drip and given IV labetalol and as needed IV hydralazine.  Nephrology consulted and resumed patient's dialysis.  Over the next few weeks, blood pressure better controlled.     At times became agitated and seen by psychiatry and started on scheduled and as needed Haldol, leading to more compliance with better mentation as well as taking his antihypertensives.     Hospital course complicated by episode of periorbital swelling bilaterally felt to be secondary to subcutaneous emphysema, resolved on steroids.  Also had episode of oral thrush treated with Magic mouthwash.     Patient's assisted living facility willing to take patient back after becomes much stronger at skilled nursing facility.  Skilled nursing facility search still in progress, apparently with no bed offers thus far.   Consultants: Nephrology Critical care Psychiatry   Procedures: Hemodialysis      ASSESSMENT & PLAN:   Active Problems:   Hyperkalemia   ESRD on dialysis [facility])   Mental health disorder   Essential hypertension   Oral thrush   Physical  deconditioning   Malnutrition of moderate degree   Anemia of chronic disease  Hyperkalemia -resolved Continue to follow.   Hypertensive emergency-resolved as of 12/31/2021 Resolved.  See below.   ESRD on dialysis [facility]) Nephrology consulted Continue scheduled dialysis   Mental health disorder -stable Unspecified.  It is unclear if he is ever had a formal diagnosis.   Patient is unable to care for himself.   He is for the most part much more calm and cooperative these days over times, he intermittently refused vitals being checked or taking his medications. He is on Tegretol and Artane which he should continue.   As needed Haldol for acute behavior agitation as above.     Essential hypertension -stable Resumed on his home p.o. amlodipine 10 mg daily, clonidine 0.3 mg p.o. 3 times daily, hydralazine 100 mg p.o. 3 times daily, isosorbide mononitrate 60 mg p.o. twice daily, losartan 100 mg daily.   Nephrology added clonidine patches with p.o. clonidine. Minoxidil was then added and titrated up to 10 mg daily.   Oral thrush -resolved 8/23: start nystatin suspension -completed course   COPD exacerbation ([place]) - resolved as of 12/18/2021 On steroids.  Appreciate pulmonary assistance Changed from Solu-Medrol to p.o. prednisone quick taper.   Physical deconditioning PT OT are recommending SNF.  However, this is similar to a previous hospitalization where TOC was unable to find skilled nursing and over time, patient's strength improved that he was able to be discharged back to his assisted living facility. PT/OT to follow however assistd living is refusing to take him back  until stint in SNF TOC following    Anemia of chronic disease Hbg stable. Check CBC ~weekly.   Malnutrition of moderate degree Nutrition Status: Nutrition Problem: Moderate Malnutrition Etiology: chronic illness (ESRD on HD) Signs/Symptoms: mild fat depletion, mild muscle depletion, moderate muscle  depletion Interventions: Refer to RD note for recommendations, Nepro shake, MVI, Prostat Body mass index is 27 kg/m.  Nutrition Problem: Moderate Malnutrition Etiology: chronic illness (ESRD on HD)       DVT prophylaxis: heparin Pertinent IV fluids/nutrition: n/a Central lines / invasive devices: n/a  Code Status: FULL Family Communication: none at this time -TOC was having a family meeting to discuss placement concerns  Disposition: inpatient TOC needs: pending SNF Barriers to discharge / significant pending items: placement issue             Subjective:  Patient has no complaints today.  Denies pain.  States he is hungry       Objective:  Vitals:   02/05/22 1230 02/05/22 1235 02/05/22 1300 02/05/22 1317  BP: (!) 160/72 (!) 145/71 119/72 123/81  Pulse: 60 60 (!) 59 61  Resp: 14 17 11 16   Temp:  97.7 F (36.5 C)  98.2 F (36.8 C)  TempSrc:  Axillary    SpO2: 100% 100% 99% 99%  Weight:      Height:        Intake/Output Summary (Last 24 hours) at 02/05/2022 1446 Last data filed at 02/05/2022 1235 Gross per 24 hour  Intake 240 ml  Output 3300 ml  Net -3060 ml   Filed Weights   02/04/22 0730 02/04/22 0815 02/05/22 0758  Weight: 94.5 kg 94.5 kg 92.5 kg    Examination:  Constitutional:  VS as above General Appearance: NAD Ears, Nose, Mouth, Throat: Normal external appearance MMM Neck: No masses, trachea midline Respiratory: Normal respiratory effort CTA B Cardiovascular: S1-S2 WNL RRR Musculoskeletal:  No clubbing/cyanosis of digits        Scheduled Medications:   (feeding supplement) PROSource Plus  30 mL Oral TID BM   amLODipine  10 mg Oral q morning   calcium acetate  667 mg Oral TID WC   carbamazepine  1,000 mg Oral q AM   Chlorhexidine Gluconate Cloth  6 each Topical Daily   cloNIDine  0.2 mg Transdermal Weekly   doxazosin  4 mg Oral QHS   [START ON 02/06/2022] epoetin (EPOGEN/PROCRIT) injection  4,000 Units Intravenous Q  M,W,F-HD   ferrous sulfate  325 mg Oral Q breakfast   fluPHENAZine  2.5 mg Oral q AM   heparin  5,000 Units Subcutaneous Q8H   hydrALAZINE  25 mg Oral TID   irbesartan  300 mg Oral Daily   isosorbide mononitrate  60 mg Oral BID   minoxidil  5 mg Oral Daily   mometasone-formoterol  2 puff Inhalation BID   multivitamin  1 tablet Oral QHS   mouth rinse  15 mL Mouth Rinse 4 times per day   pantoprazole  40 mg Oral Daily   patiromer  16.8 g Oral Daily   polyethylene glycol  17 g Oral BID   trihexyphenidyl  2 mg Oral BID WC    Continuous Infusions:  anticoagulant sodium citrate      PRN Medications:  albuterol, alteplase, anticoagulant sodium citrate, diphenhydrAMINE, haloperidol lactate, heparin, hydrALAZINE, lidocaine (PF), lidocaine-prilocaine, ondansetron **OR** ondansetron (ZOFRAN) IV, mouth rinse, pentafluoroprop-tetrafluoroeth, polyethylene glycol, senna-docusate  Antimicrobials:  Anti-infectives (From admission, onward)    None       Data  Reviewed: I have personally reviewed following labs and imaging studies  CBC: Recent Labs  Lab 02/02/22 1249 02/04/22 0822  WBC 4.4 3.8*  NEUTROABS  --  2.5  HGB 10.2* 10.9*  HCT 30.9* 31.6*  MCV 102.3* 100.3*  PLT 165 850   Basic Metabolic Panel: Recent Labs  Lab 01/30/22 0945 02/02/22 1249 02/04/22 0822 02/05/22 0511  NA 135 130* 128* 132*  K 4.8 6.2* 5.0 4.6  CL 97* 90* 90* 93*  CO2 27 26 25 27   GLUCOSE 120* 81 109* 80  BUN 99* 108* 94* 69*  CREATININE 6.44* 7.03* 6.29* 5.48*  CALCIUM 9.3 9.2 9.1 8.6*  PHOS  --  6.4* 6.5*  --    GFR: Estimated Creatinine Clearance: 15.5 mL/min (A) (by C-G formula based on SCr of 5.48 mg/dL (H)). Liver Function Tests: Recent Labs  Lab 02/02/22 1249 02/04/22 0822  ALBUMIN 3.0* 3.0*   No results for input(s): "LIPASE", "AMYLASE" in the last 168 hours. No results for input(s): "AMMONIA" in the last 168 hours. Coagulation Profile: No results for input(s): "INR", "PROTIME" in  the last 168 hours. Cardiac Enzymes: No results for input(s): "CKTOTAL", "CKMB", "CKMBINDEX", "TROPONINI" in the last 168 hours. BNP (last 3 results) No results for input(s): "PROBNP" in the last 8760 hours. HbA1C: No results for input(s): "HGBA1C" in the last 72 hours. CBG: No results for input(s): "GLUCAP" in the last 168 hours. Lipid Profile: No results for input(s): "CHOL", "HDL", "LDLCALC", "TRIG", "CHOLHDL", "LDLDIRECT" in the last 72 hours. Thyroid Function Tests: No results for input(s): "TSH", "T4TOTAL", "FREET4", "T3FREE", "THYROIDAB" in the last 72 hours. Anemia Panel: No results for input(s): "VITAMINB12", "FOLATE", "FERRITIN", "TIBC", "IRON", "RETICCTPCT" in the last 72 hours. Urine analysis: No results found for: "COLORURINE", "APPEARANCEUR", "LABSPEC", "PHURINE", "GLUCOSEU", "HGBUR", "BILIRUBINUR", "KETONESUR", "PROTEINUR", "UROBILINOGEN", "NITRITE", "LEUKOCYTESUR" Sepsis Labs: @LABRCNTIP (procalcitonin:4,lacticidven:4)  No results found for this or any previous visit (from the past 240 hour(s)).       Radiology Studies: CT HEAD WO CONTRAST (5MM)  Result Date: 12/15/2021 CLINICAL DATA:  Altered mental status EXAM: CT HEAD WITHOUT CONTRAST TECHNIQUE: Contiguous axial images were obtained from the base of the skull through the vertex without intravenous contrast. RADIATION DOSE REDUCTION: This exam was performed according to the departmental dose-optimization program which includes automated exposure control, adjustment of the mA and/or kV according to patient size and/or use of iterative reconstruction technique. COMPARISON:  None Available. FINDINGS: Brain: No evidence of acute cortical infarction, hemorrhage, cerebral edema, mass, mass effect, or midline shift. No hydrocephalus or extra-axial fluid collection. 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 Periventricular white matter changes, likely the sequela of chronic small vessel  ischemic disease. Vascular: No hyperdense vessel. Skull: Normal. Negative for fracture or focal lesion. Sinuses/Orbits: No acute finding. Other: The mastoid air cells are well aerated. IMPRESSION: No acute intracranial process. Electronically Signed   By: Merilyn Baba M.D.   On: 12/15/2021 14:22   DG Chest Portable 1 View  Result Date: 12/15/2021 CLINICAL DATA:  Altered mental status. EXAM: PORTABLE CHEST 1 VIEW COMPARISON:  None Available. FINDINGS: Mild cardiomegaly is noted. Right internal jugular dialysis catheter is noted with distal tip in expected position of cavoatrial junction. Both lungs are clear. The visualized skeletal structures are unremarkable. IMPRESSION: No active disease. Electronically Signed   By: Marijo Conception M.D.   On: 12/15/2021 14:18            LOS: 52 days    Time  spent: 25 min    Emeterio Reeve, DO [facility] 02/05/2022, 2:46 PM   Staff may message me via secure chat in [place]  but this may not receive immediate response,  please page for urgent matters!  If 7PM-7AM, please contact night-coverage [email or web address]  Dictation software was used to generate the above note. Typos may occur and escape review, as with typed/written notes. Please contact Dr Sheppard Coil directly for clarity if needed.

## 2022-02-05 NOTE — Progress Notes (Signed)
Pre HD RN assessment 

## 2022-02-05 NOTE — Progress Notes (Signed)
[facility]  ROUNDING NOTE   Subjective:   Todd Brown is a 61 year old male with past medical history including hypertension, anemia, schizophrenia and end-stage renal disease on hemodialysis.  Patient has presented to the ED with altered mental status.  Patient has been admitted for Altered mental status [R41.82] Hypertensive urgency [I16.0] Severe hypertension [I10] Altered mental status, unspecified altered mental status type [R41.82]  Patient is known to our practice and receives outpatient dialysis treatments at [facility] on a MWF schedule.   Update Patient seen and evaluated during dialysis   HEMODIALYSIS FLOWSHEET:  Blood Flow Rate (mL/min): 400 mL/min Arterial Pressure (mmHg): -160 mmHg Venous Pressure (mmHg): 300 mmHg TMP (mmHg): 7 mmHg Ultrafiltration Rate (mL/min): 1300 mL/min Dialysate Flow Rate (mL/min): 300 ml/min Dialysis Fluid Bolus: Normal Saline Bolus Amount (mL): 300 mL  Currently eating breakfast,  Receiving UF only treatment today.     Objective:  Vital signs in last 24 hours:  Temp:  [97.6 F (36.4 C)-98.8 F (37.1 C)] 98.4 F (36.9 C) (09/07 0758) Pulse Rate:  [61-101] 61 (09/07 1100) Resp:  [9-22] 17 (09/07 1100) BP: (97-172)/(53-95) 114/73 (09/07 1100) SpO2:  [98 %-100 %] 100 % (09/07 1100) Weight:  [92.5 kg] 92.5 kg (09/07 0758)  Weight change:  Filed Weights   02/04/22 0730 02/04/22 0815 02/05/22 0758  Weight: 94.5 kg 94.5 kg 92.5 kg    Intake/Output: I/O last 3 completed shifts: In: -  Out: 3500 [Other:3500]   Intake/Output this shift:  Total I/O In: 240 [P.O.:240] Out: 300 [Stool:300]  Physical Exam: General: NAD  Head: Normocephalic, atraumatic. Moist oral mucosal membranes  Eyes: Anicteric  Lungs:  Clear to auscultation, normal effort, room air  Heart: Regular rate and rhythm  Abdomen:  Soft, nontender, non distended  Extremities:  3+ peripheral edema.  Neurologic: Alert, answer simple questions   Skin: No lesions  Access: Lt AVF     Basic Metabolic Panel: Recent Labs  Lab 01/30/22 0945 02/02/22 1249 02/04/22 0822 02/05/22 0511  NA 135 130* 128* 132*  K 4.8 6.2* 5.0 4.6  CL 97* 90* 90* 93*  CO2 27 26 25 27   GLUCOSE 120* 81 109* 80  BUN 99* 108* 94* 69*  CREATININE 6.44* 7.03* 6.29* 5.48*  CALCIUM 9.3 9.2 9.1 8.6*  PHOS  --  6.4* 6.5*  --      Liver Function Tests: Recent Labs  Lab 02/02/22 1249 02/04/22 0822  ALBUMIN 3.0* 3.0*     No results for input(s): "LIPASE", "AMYLASE" in the last 168 hours. No results for input(s): "AMMONIA" in the last 168 hours.  CBC: Recent Labs  Lab 02/02/22 1249 02/04/22 0822  WBC 4.4 3.8*  NEUTROABS  --  2.5  HGB 10.2* 10.9*  HCT 30.9* 31.6*  MCV 102.3* 100.3*  PLT 165 167     Cardiac Enzymes: No results for input(s): "CKTOTAL", "CKMB", "CKMBINDEX", "TROPONINI" in the last 168 hours.  BNP: Invalid input(s): "POCBNP"  CBG: No results for input(s): "GLUCAP" in the last 168 hours.   Microbiology: Results for orders placed or performed during the hospital encounter of 12/15/21  SARS Coronavirus 2 by RT PCR (hospital order, performed in [facility] hospital lab) *cepheid single result test* Anterior Nasal Swab     Status: None   Collection Time: 12/15/21  5:50 PM   Specimen: Anterior Nasal Swab  Result Value Ref Range Status   SARS Coronavirus 2 by RT PCR NEGATIVE NEGATIVE Final    Comment: (NOTE) SARS-CoV-2 target nucleic  acids are NOT DETECTED.  The SARS-CoV-2 RNA is generally detectable in upper and lower respiratory specimens during the acute phase of infection. The lowest concentration of SARS-CoV-2 viral copies this assay can detect is 250 copies / mL. A negative result does not preclude SARS-CoV-2 infection and should not be used as the sole basis for treatment or other patient management decisions.  A negative result may occur with improper specimen collection / handling, submission of specimen  other than nasopharyngeal swab, presence of viral mutation(s) within the areas targeted by this assay, and inadequate number of viral copies (<250 copies / mL). A negative result must be combined with clinical observations, patient history, and epidemiological information.  Fact Sheet for Patients:   https://www.patel.info/  Fact Sheet for Healthcare Providers: https://hall.com/  This test is not yet approved or  cleared by the [place] FDA and has been authorized for detection and/or diagnosis of SARS-CoV-2 by FDA under an Emergency Use Authorization (EUA).  This EUA will remain in effect (meaning this test can be used) for the duration of the COVID-19 declaration under Section 564(b)(1) of the Act, 21 U.S.C. section 360bbb-3(b)(1), unless the authorization is terminated or revoked sooner.  Performed at [facility], [address] [place], [place] [address]   MRSA Next Gen by PCR, Nasal     Status: None   Collection Time: 12/15/21  9:03 PM   Specimen: Nasal Mucosa; Nasal Swab  Result Value Ref Range Status   MRSA by PCR Next Gen NOT DETECTED NOT DETECTED Final    Comment: (NOTE) The GeneXpert MRSA Assay (FDA approved for NASAL specimens only), is one component of a comprehensive MRSA colonization surveillance program. It is not intended to diagnose MRSA infection nor to guide or monitor treatment for MRSA infections. Test performance is not FDA approved in patients less than 30 years old. Performed at [facility], [address] [place], [place] [address]     Coagulation Studies: No results for input(s): "LABPROT", "INR" in the last 72 hours.  Urinalysis: No results for input(s): "COLORURINE", "LABSPEC", "PHURINE", "GLUCOSEU", "HGBUR", "BILIRUBINUR", "KETONESUR", "PROTEINUR", "UROBILINOGEN", "NITRITE", "LEUKOCYTESUR" in the last 72 hours.  Invalid input(s): "APPERANCEUR"    Imaging: No results  found.   Medications:    anticoagulant sodium citrate      (feeding supplement) PROSource Plus  30 mL Oral TID BM   amLODipine  10 mg Oral q morning   calcium acetate  667 mg Oral TID WC   carbamazepine  1,000 mg Oral q AM   Chlorhexidine Gluconate Cloth  6 each Topical Daily   cloNIDine  0.2 mg Transdermal Weekly   doxazosin  4 mg Oral QHS   [START ON 02/06/2022] epoetin (EPOGEN/PROCRIT) injection  4,000 Units Intravenous Q M,W,F-HD   ferrous sulfate  325 mg Oral Q breakfast   fluPHENAZine  2.5 mg Oral q AM   heparin  5,000 Units Subcutaneous Q8H   hydrALAZINE  25 mg Oral TID   irbesartan  300 mg Oral Daily   isosorbide mononitrate  60 mg Oral BID   minoxidil  5 mg Oral Daily   mometasone-formoterol  2 puff Inhalation BID   multivitamin  1 tablet Oral QHS   mouth rinse  15 mL Mouth Rinse 4 times per day   pantoprazole  40 mg Oral Daily   patiromer  16.8 g Oral Daily   polyethylene glycol  17 g Oral BID   trihexyphenidyl  2 mg Oral BID WC   albuterol,  alteplase, anticoagulant sodium citrate, diphenhydrAMINE, haloperidol lactate, heparin, hydrALAZINE, lidocaine (PF), lidocaine-prilocaine, ondansetron **OR** ondansetron (ZOFRAN) IV, mouth rinse, pentafluoroprop-tetrafluoroeth, polyethylene glycol, senna-docusate  Assessment/ Plan:  Mr. Demetris Capell is a 61 y.o.  male with past medical history including hypertension, anemia, schizophrenia and end-stage renal disease on hemodialysis.  Patient has presented to the ED with altered mental status.  Patient has been admitted for Altered mental status [R41.82] Hypertensive urgency [I16.0] Severe hypertension [I10] Altered mental status, unspecified altered mental status type [R41.82]  [facility]/MWF/Lt AVF  End-stage renal disease with hyperkalemia on hemodialysis.  Will maintain outpatient schedule if possible.   Receiving a UF only treatment today. Goal UF 3.5L as tolerated. Next treatment scheduled for Friday.   2. Anemia  of chronic kidney disease Lab Results  Component Value Date   HGB 10.9 (L) 02/04/2022  Hgb at goal EPO to 4000 units with treatment Friday.   3. Secondary Hyperparathyroidism:  Lab Results  Component Value Date   CALCIUM 8.6 (L) 02/05/2022   PHOS 6.5 (H) 02/04/2022  Phosphorus remains elevated, may consider increasing binder with meals.   4.  Hypertension with chronic kidney disease.  Home regimen includes amlodipine, clonidine, hydralazine, isosorbide, and losartan.    Patient is currently on amlodipine 10 mg p.o. daily Hydralazine 25 mg p.o. 3 times daily Irbesartan 300 mg p.o. daily Isosorbide 60 mg p.o. twice daily Doxazosin 4 mg p.o. daily  Minoxidil 5 mg p.o. daily  Blood pressure 119/73 during dialysis.    LOS: [place] 9/7/202311:26 AM

## 2022-02-05 NOTE — Progress Notes (Signed)
Pt tx paused d/t machine alarms. Outset support called. Directions given to HD staff. Will restart treatment when alarms corrected.

## 2022-02-05 NOTE — Progress Notes (Signed)
Pt UF treatment restarted at 1027a. Pt alert, no c/o, vss. Safety maintained.

## 2022-02-06 MED ORDER — EPOETIN ALFA 4000 UNIT/ML IJ SOLN
INTRAMUSCULAR | Status: AC
  Administered 2022-02-06: 4000 [IU]
  Filled 2022-02-06: qty 1

## 2022-02-06 MED ORDER — CALCIUM ACETATE (PHOS BINDER) 667 MG PO CAPS
1334.0000 mg | ORAL_CAPSULE | Freq: Three times a day (TID) | ORAL | Status: DC
  Administered 2022-02-06 – 2022-02-22 (×38): 1334 mg via ORAL
  Filled 2022-02-06 (×38): qty 2

## 2022-02-06 NOTE — Progress Notes (Signed)
Post HD RN assessment 

## 2022-02-06 NOTE — Progress Notes (Signed)
OT Cancellation Note  Patient Details Name: Kahleb Mcclane MRN: [id number] DOB: [birth year]   Cancelled Treatment:    Reason Eval/Treat Not Completed: Patient at procedure or test/ unavailable. Pt out again for HD today. Will re-attempt OT tx at later date/time as pt is available.  Ardeth Perfect., MPH, MS, OTR/L ascom [phone] 02/06/22, 11:19 AM

## 2022-02-06 NOTE — Progress Notes (Signed)
Pre HD RN assessment 

## 2022-02-06 NOTE — Progress Notes (Signed)
Pt 3.5 hr HD treatment complete w/ no complications. Report to primary RN. Pt alert, no c/o, vss.  Start: 0819 End: 1147 3020ml fluid removed 84L BVP 88.7kg post bed weight No meds w/ HD

## 2022-02-06 NOTE — Progress Notes (Signed)
[facility]  ROUNDING NOTE   Subjective:   Todd Brown is a 61 year old male with past medical history including hypertension, anemia, schizophrenia and end-stage renal disease on hemodialysis.  Patient has presented to the ED with altered mental status.  Patient has been admitted for Altered mental status [R41.82] Hypertensive urgency [I16.0] Severe hypertension [I10] Altered mental status, unspecified altered mental status type [R41.82]  Patient is known to our practice and receives outpatient dialysis treatments at [facility] on a MWF schedule.   Update Patient seen and evaluated during dialysis   HEMODIALYSIS FLOWSHEET:  Blood Flow Rate (mL/min): 400 mL/min Arterial Pressure (mmHg): -170 mmHg Venous Pressure (mmHg): 250 mmHg TMP (mmHg): 7 mmHg Ultrafiltration Rate (mL/min): 1114 mL/min Dialysate Flow Rate (mL/min): 300 ml/min Dialysis Fluid Bolus: Normal Saline Bolus Amount (mL): 300 mL  Tolerating treatment well     Objective:  Vital signs in last 24 hours:  Temp:  [97.7 F (36.5 C)-98.9 F (37.2 C)] 97.9 F (36.6 C) (09/08 1147) Pulse Rate:  [52-93] 59 (09/08 1147) Resp:  [11-21] 11 (09/08 1147) BP: (117-173)/(66-120) 146/69 (09/08 1151) SpO2:  [71 %-100 %] 100 % (09/08 1147) Weight:  [90.4 kg-91.5 kg] 90.4 kg (09/08 0812)  Weight change: -2 kg Filed Weights   02/05/22 0758 02/06/22 0522 02/06/22 0812  Weight: 92.5 kg 91.5 kg 90.4 kg    Intake/Output: I/O last 3 completed shifts: In: 240 [P.O.:240] Out: 3300 [Other:3000; Stool:300]   Intake/Output this shift:  Total I/O In: 360 [P.O.:360] Out: 3000 [Other:3000]  Physical Exam: General: NAD  Head: Normocephalic, atraumatic. Moist oral mucosal membranes  Eyes: Anicteric  Lungs:  Clear to auscultation, normal effort, room air  Heart: Regular rate and rhythm  Abdomen:  Soft, nontender, non distended  Extremities:  3+ peripheral edema.  Neurologic: Alert, answer simple  questions  Skin: No lesions  Access: Lt AVF     Basic Metabolic Panel: Recent Labs  Lab 02/02/22 1249 02/04/22 0822 02/05/22 0511  NA 130* 128* 132*  K 6.2* 5.0 4.6  CL 90* 90* 93*  CO2 26 25 27   GLUCOSE 81 109* 80  BUN 108* 94* 69*  CREATININE 7.03* 6.29* 5.48*  CALCIUM 9.2 9.1 8.6*  PHOS 6.4* 6.5*  --      Liver Function Tests: Recent Labs  Lab 02/02/22 1249 02/04/22 0822  ALBUMIN 3.0* 3.0*     No results for input(s): "LIPASE", "AMYLASE" in the last 168 hours. No results for input(s): "AMMONIA" in the last 168 hours.  CBC: Recent Labs  Lab 02/02/22 1249 02/04/22 0822  WBC 4.4 3.8*  NEUTROABS  --  2.5  HGB 10.2* 10.9*  HCT 30.9* 31.6*  MCV 102.3* 100.3*  PLT 165 167     Cardiac Enzymes: No results for input(s): "CKTOTAL", "CKMB", "CKMBINDEX", "TROPONINI" in the last 168 hours.  BNP: Invalid input(s): "POCBNP"  CBG: No results for input(s): "GLUCAP" in the last 168 hours.   Microbiology: Results for orders placed or performed during the hospital encounter of 12/15/21  SARS Coronavirus 2 by RT PCR (hospital order, performed in [facility] hospital lab) *cepheid single result test* Anterior Nasal Swab     Status: None   Collection Time: 12/15/21  5:50 PM   Specimen: Anterior Nasal Swab  Result Value Ref Range Status   SARS Coronavirus 2 by RT PCR NEGATIVE NEGATIVE Final    Comment: (NOTE) SARS-CoV-2 target nucleic acids are NOT DETECTED.  The SARS-CoV-2 RNA is generally detectable in upper and lower respiratory  specimens during the acute phase of infection. The lowest concentration of SARS-CoV-2 viral copies this assay can detect is 250 copies / mL. A negative result does not preclude SARS-CoV-2 infection and should not be used as the sole basis for treatment or other patient management decisions.  A negative result may occur with improper specimen collection / handling, submission of specimen other than nasopharyngeal swab, presence of viral  mutation(s) within the areas targeted by this assay, and inadequate number of viral copies (<250 copies / mL). A negative result must be combined with clinical observations, patient history, and epidemiological information.  Fact Sheet for Patients:   https://www.patel.info/  Fact Sheet for Healthcare Providers: https://hall.com/  This test is not yet approved or  cleared by the [place] FDA and has been authorized for detection and/or diagnosis of SARS-CoV-2 by FDA under an Emergency Use Authorization (EUA).  This EUA will remain in effect (meaning this test can be used) for the duration of the COVID-19 declaration under Section 564(b)(1) of the Act, 21 U.S.C. section 360bbb-3(b)(1), unless the authorization is terminated or revoked sooner.  Performed at [facility], [address] [place], [place] [address]   MRSA Next Gen by PCR, Nasal     Status: None   Collection Time: 12/15/21  9:03 PM   Specimen: Nasal Mucosa; Nasal Swab  Result Value Ref Range Status   MRSA by PCR Next Gen NOT DETECTED NOT DETECTED Final    Comment: (NOTE) The GeneXpert MRSA Assay (FDA approved for NASAL specimens only), is one component of a comprehensive MRSA colonization surveillance program. It is not intended to diagnose MRSA infection nor to guide or monitor treatment for MRSA infections. Test performance is not FDA approved in patients less than 35 years old. Performed at [facility], [address] [place], [place] [address]     Coagulation Studies: No results for input(s): "LABPROT", "INR" in the last 72 hours.  Urinalysis: No results for input(s): "COLORURINE", "LABSPEC", "PHURINE", "GLUCOSEU", "HGBUR", "BILIRUBINUR", "KETONESUR", "PROTEINUR", "UROBILINOGEN", "NITRITE", "LEUKOCYTESUR" in the last 72 hours.  Invalid input(s): "APPERANCEUR"    Imaging: No results found.   Medications:    anticoagulant sodium  citrate      (feeding supplement) PROSource Plus  30 mL Oral TID BM   amLODipine  10 mg Oral q morning   calcium acetate  667 mg Oral TID WC   carbamazepine  1,000 mg Oral q AM   Chlorhexidine Gluconate Cloth  6 each Topical Daily   cloNIDine  0.2 mg Transdermal Weekly   doxazosin  4 mg Oral QHS   epoetin (EPOGEN/PROCRIT) injection  4,000 Units Intravenous Q M,W,F-HD   ferrous sulfate  325 mg Oral Q breakfast   fluPHENAZine  2.5 mg Oral q AM   heparin  5,000 Units Subcutaneous Q8H   hydrALAZINE  25 mg Oral TID   irbesartan  300 mg Oral Daily   isosorbide mononitrate  60 mg Oral BID   minoxidil  5 mg Oral Daily   mometasone-formoterol  2 puff Inhalation BID   multivitamin  1 tablet Oral QHS   mouth rinse  15 mL Mouth Rinse 4 times per day   pantoprazole  40 mg Oral Daily   patiromer  16.8 g Oral Daily   polyethylene glycol  17 g Oral BID   trihexyphenidyl  2 mg Oral BID WC   albuterol, alteplase, anticoagulant sodium citrate, diphenhydrAMINE, haloperidol lactate, heparin, hydrALAZINE, lidocaine (PF), lidocaine-prilocaine, ondansetron **OR** ondansetron (ZOFRAN) IV, mouth rinse,  pentafluoroprop-tetrafluoroeth, polyethylene glycol, senna-docusate  Assessment/ Plan:  Todd Brown is a 61 y.o.  male with past medical history including hypertension, anemia, schizophrenia and end-stage renal disease on hemodialysis.  Patient has presented to the ED with altered mental status.  Patient has been admitted for Altered mental status [R41.82] Hypertensive urgency [I16.0] Severe hypertension [I10] Altered mental status, unspecified altered mental status type [R41.82]  [facility]/MWF/Lt AVF  End-stage renal disease with hyperkalemia on hemodialysis.  Will maintain outpatient schedule if possible.   Patient received UF only treatment yesterday, 3L removed. Receiving scheduled treatment today, UF goal 3L as tolerated. Next treatment scheduled for Monday.   2. Anemia of chronic  kidney disease Lab Results  Component Value Date   HGB 10.9 (L) 02/04/2022   EPO 4000 units with treatment   3. Secondary Hyperparathyroidism:  Lab Results  Component Value Date   CALCIUM 8.6 (L) 02/05/2022   PHOS 6.5 (H) 02/04/2022  Phosphorus elevated with increased appetite and liberalized diet. Will increase binders, calcium acetate 1334ng with meals.   4.  Hypertension with chronic kidney disease.  Home regimen includes amlodipine, clonidine, hydralazine, isosorbide, and losartan.    Patient is currently on amlodipine 10 mg p.o. daily Hydralazine 25 mg p.o. 3 times daily Irbesartan 300 mg p.o. daily Isosorbide 60 mg p.o. twice daily Doxazosin 4 mg p.o. daily  Minoxidil 5 mg p.o. daily  Blood pressure 146/69 during dialysis.    LOS: [place] 9/8/202311:58 AM

## 2022-02-06 NOTE — Progress Notes (Addendum)
PROGRESS NOTE    Todd Brown   QIH:[id number] DOB: Oct 24, 1960  DOA: 12/15/2021 Date of Service: 02/06/22 PCP: Jodi Marble, MD     Brief Narrative / Hospital Course:  61 year old male with past medical history of hypertension and end-stage renal disease on hemodialysis presented to the emergency room from his group home/Assisted living facility on 7/17 with confusion.  (Patient's baseline was that he is unable to care for himself and has a legal guardian.)  Patient last received dialysis on Friday, 7/14.     Patient found to have markedly elevated blood pressure with a systolic in the 875I requiring Cardene drip and placement of patient to the ICU.  Patient with questionable cardiac ischemia as well.  CT scan of head and neurological exam unremarkable.     Patient was admitted to the hospitalist service and started on his oral antihypertensives, nicardipine drip and given IV labetalol and as needed IV hydralazine.  Nephrology consulted and resumed patient's dialysis.  Over the next few weeks, blood pressure better controlled.     At times became agitated and seen by psychiatry and started on scheduled and as needed Haldol, leading to more compliance with better mentation as well as taking his antihypertensives.     Hospital course complicated by episode of periorbital swelling bilaterally felt to be secondary to subcutaneous emphysema, resolved on steroids.  Also had episode of oral thrush treated with Magic mouthwash.     Patient's assisted living facility willing to take patient back after becomes much stronger at skilled nursing facility.  Skilled nursing facility search still in progress, apparently with no bed offers thus far.   Consultants: Nephrology Critical care Psychiatry   Procedures: Hemodialysis      ASSESSMENT & PLAN:   Active Problems:   Hyperkalemia   ESRD on dialysis [facility])   Mental health disorder   Essential hypertension   Oral thrush   Physical  deconditioning   Malnutrition of moderate degree   Anemia of chronic disease  Hyperkalemia -resolved Continue to follow.   Hypertensive emergency-resolved as of 12/31/2021 Resolved.  See below.   ESRD on dialysis [facility]) Nephrology consulted Continue scheduled dialysis   Mental health disorder -stable Unspecified.  It is unclear if he is ever had a formal diagnosis.   Patient is unable to care for himself.   He is for the most part much more calm and cooperative He is on Tegretol and Artane which he should continue.   As needed Haldol for acute behavior agitation     Essential hypertension -stable Resumed on his home p.o. amlodipine 10 mg daily, clonidine 0.3 mg p.o. 3 times daily, hydralazine 100 mg p.o. 3 times daily, isosorbide mononitrate 60 mg p.o. twice daily, losartan 100 mg daily.   Nephrology added clonidine patches with p.o. clonidine. Minoxidil was then added and titrated up to 10 mg daily.   Oral thrush -resolved 8/23: start nystatin suspension -completed course   COPD exacerbation ([place]) - resolved as of 12/18/2021 On steroids.  Appreciate pulmonary assistance Changed from Solu-Medrol to p.o. prednisone quick taper.   Physical deconditioning PT OT are recommending SNF.  However, this is similar to a previous hospitalization where TOC was unable to find skilled nursing and over time, patient's strength improved that he was able to be discharged back to his assisted living facility. PT/OT to follow however assistd living is refusing to take him back until stint in SNF TOC following    Anemia of chronic disease Hbg stable. Check  CBC ~weekly.   Malnutrition of moderate degree Nutrition Status: Nutrition Problem: Moderate Malnutrition Etiology: chronic illness (ESRD on HD) Signs/Symptoms: mild fat depletion, mild muscle depletion, moderate muscle depletion Interventions: Refer to RD note for recommendations, Nepro shake, MVI, Prostat Body mass index is 27 kg/m.   Nutrition Problem: Moderate Malnutrition Etiology: chronic illness (ESRD on HD)       DVT prophylaxis: heparin Pertinent IV fluids/nutrition: n/a Central lines / invasive devices: n/a  Code Status: FULL Family Communication: none at this time -TOC was having a family meeting to discuss placement concerns  Disposition: inpatient TOC needs: pending SNF Barriers to discharge / significant pending items: placement issue             Subjective:  Patient has no complaints today.       Objective:  Vitals:   02/06/22 1130 02/06/22 1147 02/06/22 1151 02/06/22 1230  BP: (!) 146/68 (!) 152/74 (!) 146/69 131/69  Pulse: 60 (!) 59  (!) 59  Resp: 14 11  14   Temp:  97.9 F (36.6 C)  97.9 F (36.6 C)  TempSrc:  Axillary    SpO2: 100% 100%  100%  Weight:      Height:        Intake/Output Summary (Last 24 hours) at 02/06/2022 1510 Last data filed at 02/06/2022 1147 Gross per 24 hour  Intake 360 ml  Output 3000 ml  Net -2640 ml    Filed Weights   02/05/22 0758 02/06/22 0522 02/06/22 0812  Weight: 92.5 kg 91.5 kg 90.4 kg    Examination:  Constitutional:  VS as above General Appearance: NAD Respiratory: Normal respiratory effort Musculoskeletal:  No clubbing/cyanosis of digits        Scheduled Medications:   (feeding supplement) PROSource Plus  30 mL Oral TID BM   amLODipine  10 mg Oral q morning   calcium acetate  1,334 mg Oral TID WC   carbamazepine  1,000 mg Oral q AM   Chlorhexidine Gluconate Cloth  6 each Topical Daily   cloNIDine  0.2 mg Transdermal Weekly   doxazosin  4 mg Oral QHS   epoetin (EPOGEN/PROCRIT) injection  4,000 Units Intravenous Q M,W,F-HD   ferrous sulfate  325 mg Oral Q breakfast   fluPHENAZine  2.5 mg Oral q AM   heparin  5,000 Units Subcutaneous Q8H   hydrALAZINE  25 mg Oral TID   irbesartan  300 mg Oral Daily   isosorbide mononitrate  60 mg Oral BID   minoxidil  5 mg Oral Daily   mometasone-formoterol  2 puff Inhalation  BID   multivitamin  1 tablet Oral QHS   mouth rinse  15 mL Mouth Rinse 4 times per day   pantoprazole  40 mg Oral Daily   patiromer  16.8 g Oral Daily   polyethylene glycol  17 g Oral BID   trihexyphenidyl  2 mg Oral BID WC    Continuous Infusions:  anticoagulant sodium citrate      PRN Medications:  albuterol, alteplase, anticoagulant sodium citrate, diphenhydrAMINE, haloperidol lactate, heparin, hydrALAZINE, lidocaine (PF), lidocaine-prilocaine, ondansetron **OR** ondansetron (ZOFRAN) IV, mouth rinse, pentafluoroprop-tetrafluoroeth, polyethylene glycol, senna-docusate  Antimicrobials:  Anti-infectives (From admission, onward)    None       Data Reviewed: I have personally reviewed following labs and imaging studies  CBC: Recent Labs  Lab 02/02/22 1249 02/04/22 0822  WBC 4.4 3.8*  NEUTROABS  --  2.5  HGB 10.2* 10.9*  HCT 30.9* 31.6*  MCV 102.3* 100.3*  PLT 165 250    Basic Metabolic Panel: Recent Labs  Lab 02/02/22 1249 02/04/22 0822 02/05/22 0511  NA 130* 128* 132*  K 6.2* 5.0 4.6  CL 90* 90* 93*  CO2 26 25 27   GLUCOSE 81 109* 80  BUN 108* 94* 69*  CREATININE 7.03* 6.29* 5.48*  CALCIUM 9.2 9.1 8.6*  PHOS 6.4* 6.5*  --     GFR: Estimated Creatinine Clearance: 15.5 mL/min (A) (by C-G formula based on SCr of 5.48 mg/dL (H)). Liver Function Tests: Recent Labs  Lab 02/02/22 1249 02/04/22 0822  ALBUMIN 3.0* 3.0*    No results for input(s): "LIPASE", "AMYLASE" in the last 168 hours. No results for input(s): "AMMONIA" in the last 168 hours. Coagulation Profile: No results for input(s): "INR", "PROTIME" in the last 168 hours. Cardiac Enzymes: No results for input(s): "CKTOTAL", "CKMB", "CKMBINDEX", "TROPONINI" in the last 168 hours. BNP (last 3 results) No results for input(s): "PROBNP" in the last 8760 hours. HbA1C: No results for input(s): "HGBA1C" in the last 72 hours. CBG: No results for input(s): "GLUCAP" in the last 168 hours. Lipid  Profile: No results for input(s): "CHOL", "HDL", "LDLCALC", "TRIG", "CHOLHDL", "LDLDIRECT" in the last 72 hours. Thyroid Function Tests: No results for input(s): "TSH", "T4TOTAL", "FREET4", "T3FREE", "THYROIDAB" in the last 72 hours. Anemia Panel: No results for input(s): "VITAMINB12", "FOLATE", "FERRITIN", "TIBC", "IRON", "RETICCTPCT" in the last 72 hours. Urine analysis: No results found for: "COLORURINE", "APPEARANCEUR", "LABSPEC", "PHURINE", "GLUCOSEU", "HGBUR", "BILIRUBINUR", "KETONESUR", "PROTEINUR", "UROBILINOGEN", "NITRITE", "LEUKOCYTESUR" Sepsis Labs: @LABRCNTIP (procalcitonin:4,lacticidven:4)  No results found for this or any previous visit (from the past 240 hour(s)).       Radiology Studies: CT HEAD WO CONTRAST (5MM)  Result Date: 12/15/2021 CLINICAL DATA:  Altered mental status EXAM: CT HEAD WITHOUT CONTRAST TECHNIQUE: Contiguous axial images were obtained from the base of the skull through the vertex without intravenous contrast. RADIATION DOSE REDUCTION: This exam was performed according to the departmental dose-optimization program which includes automated exposure control, adjustment of the mA and/or kV according to patient size and/or use of iterative reconstruction technique. COMPARISON:  None Available. FINDINGS: Brain: No evidence of acute cortical infarction, hemorrhage, cerebral edema, mass, mass effect, or midline shift. No hydrocephalus or extra-axial fluid collection. 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 Periventricular white matter changes, likely the sequela of chronic small vessel ischemic disease. Vascular: No hyperdense vessel. Skull: Normal. Negative for fracture or focal lesion. Sinuses/Orbits: No acute finding. Other: The mastoid air cells are well aerated. IMPRESSION: No acute intracranial process. Electronically Signed   By: Merilyn Baba M.D.   On: 12/15/2021 14:22   DG Chest Portable 1 View  Result Date:  12/15/2021 CLINICAL DATA:  Altered mental status. EXAM: PORTABLE CHEST 1 VIEW COMPARISON:  None Available. FINDINGS: Mild cardiomegaly is noted. Right internal jugular dialysis catheter is noted with distal tip in expected position of cavoatrial junction. Both lungs are clear. The visualized skeletal structures are unremarkable. IMPRESSION: No active disease. Electronically Signed   By: Marijo Conception M.D.   On: 12/15/2021 14:18            LOS: 53 days    Time spent: 25 min    Emeterio Reeve, DO [facility] 02/06/2022, 3:10 PM   Staff may message me via secure chat in [place]  but this may not receive immediate response,  please page for urgent matters!  If 7PM-7AM, please contact night-coverage [email or web address]  Dictation software was used to generate  the above note. Typos may occur and escape review, as with typed/written notes. Please contact Dr Sheppard Coil directly for clarity if needed.

## 2022-02-07 MED ORDER — ACETAMINOPHEN 325 MG PO TABS
650.0000 mg | ORAL_TABLET | Freq: Once | ORAL | Status: AC
  Administered 2022-02-07: 650 mg via ORAL
  Filled 2022-02-07: qty 2

## 2022-02-07 NOTE — Progress Notes (Signed)
[facility]  PROGRESS NOTE   Subjective:   Patient is out of bed to chair.  Denies any shortness of breath or chest pain. Had stable dialysis yesterday.  Objective:  Vital signs: Blood pressure 111/66, pulse 66, temperature 97.9 F (36.6 C), resp. rate 17, height 6' (1.829 m), weight 90.6 kg, SpO2 92 %.  Intake/Output Summary (Last 24 hours) at 02/07/2022 1449 Last data filed at 02/07/2022 1333 Gross per 24 hour  Intake 161.75 ml  Output --  Net 161.75 ml   Filed Weights   02/06/22 0522 02/06/22 0812 02/07/22 0453  Weight: 91.5 kg 90.4 kg 90.6 kg     Physical Exam: General:  No acute distress  Head:  Normocephalic, atraumatic. Moist oral mucosal membranes  Eyes:  Anicteric  Neck:  Supple  Lungs:   Clear to auscultation, normal effort  Heart:  S1S2 no rubs  Abdomen:   Soft, nontender, bowel sounds present  Extremities:  peripheral edema.  Neurologic:  Awake, alert, following commands  Skin:  No lesions  Access:     Basic Metabolic Panel: Recent Labs  Lab 02/02/22 1249 02/04/22 0822 02/05/22 0511  NA 130* 128* 132*  K 6.2* 5.0 4.6  CL 90* 90* 93*  CO2 26 25 27   GLUCOSE 81 109* 80  BUN 108* 94* 69*  CREATININE 7.03* 6.29* 5.48*  CALCIUM 9.2 9.1 8.6*  PHOS 6.4* 6.5*  --     CBC: Recent Labs  Lab 02/02/22 1249 02/04/22 0822  WBC 4.4 3.8*  NEUTROABS  --  2.5  HGB 10.2* 10.9*  HCT 30.9* 31.6*  MCV 102.3* 100.3*  PLT 165 167     Urinalysis: No results for input(s): "COLORURINE", "LABSPEC", "PHURINE", "GLUCOSEU", "HGBUR", "BILIRUBINUR", "KETONESUR", "PROTEINUR", "UROBILINOGEN", "NITRITE", "LEUKOCYTESUR" in the last 72 hours.  Invalid input(s): "APPERANCEUR"    Imaging: No results found.   Medications:    anticoagulant sodium citrate      (feeding supplement) PROSource Plus  30 mL Oral TID BM   amLODipine  10 mg Oral q morning   calcium acetate  1,334 mg Oral TID WC   carbamazepine  1,000 mg Oral q AM   Chlorhexidine Gluconate  Cloth  6 each Topical Daily   cloNIDine  0.2 mg Transdermal Weekly   doxazosin  4 mg Oral QHS   epoetin (EPOGEN/PROCRIT) injection  4,000 Units Intravenous Q M,W,F-HD   ferrous sulfate  325 mg Oral Q breakfast   fluPHENAZine  2.5 mg Oral q AM   heparin  5,000 Units Subcutaneous Q8H   hydrALAZINE  25 mg Oral TID   irbesartan  300 mg Oral Daily   isosorbide mononitrate  60 mg Oral BID   minoxidil  5 mg Oral Daily   mometasone-formoterol  2 puff Inhalation BID   multivitamin  1 tablet Oral QHS   mouth rinse  15 mL Mouth Rinse 4 times per day   pantoprazole  40 mg Oral Daily   patiromer  16.8 g Oral Daily   polyethylene glycol  17 g Oral BID   trihexyphenidyl  2 mg Oral BID WC    Assessment/ Plan:     Active Problems:   Essential hypertension   ESRD on dialysis [facility])   Mental health disorder   Physical deconditioning   Oral thrush   Malnutrition of moderate degree   Hyperkalemia   Anemia of chronic disease  61 year old male with history of hypertension, anemia, schizophrenia, end-stage renal disease on hemodialysis on Monday Wednesday Friday schedule now  admitted with history of mental status changes and accelerated hypertension.  #1: End-stage renal disease: Patient is stable dialysis yesterday with 3 L fluid removal.  #2: Anemia: We will continue the anemia protocol with Procrit.  #3: Secondary parathyroidism: Patient has been on PhosLo.  #4: Hypertension: Patient has poorly controlled hypertension he has been on minoxidil, isosorbide, irbesartan, amlodipine, clonidine, hydralazine and doxazosin.  Blood pressure is well controlled at this time.  Continue supportive care.   LOS: [place], [facility] 9/9/20232:49 PM

## 2022-02-07 NOTE — Plan of Care (Signed)
  Problem: Education: Goal: Knowledge of General Education information will improve Description Including pain rating scale, medication(s)/side effects and non-pharmacologic comfort measures Outcome: Progressing   Problem: Clinical Measurements: Goal: Respiratory complications will improve Outcome: Progressing   Problem: Safety: Goal: Ability to remain free from injury will improve Outcome: Progressing   Problem: Skin Integrity: Goal: Risk for impaired skin integrity will decrease Outcome: Progressing   

## 2022-02-07 NOTE — Progress Notes (Signed)
PROGRESS NOTE    Todd Brown   RWE:[id number] DOB: 08-Jun-1960  DOA: 12/15/2021 Date of Service: 02/07/22 PCP: Jodi Marble, MD     Brief Narrative / Hospital Course:  61 year old male with past medical history of hypertension and end-stage renal disease on hemodialysis presented to the emergency room from his group home/Assisted living facility on 7/17 with confusion.  (Patient's baseline was that he is unable to care for himself and has a legal guardian.)  Patient last received dialysis on Friday, 7/14.     Patient found to have markedly elevated blood pressure with a systolic in the 619J requiring Cardene drip and placement of patient to the ICU.  Patient with questionable cardiac ischemia as well.  CT scan of head and neurological exam unremarkable.     Patient was admitted to the hospitalist service and started on his oral antihypertensives, nicardipine drip and given IV labetalol and as needed IV hydralazine.  Nephrology consulted and resumed patient's dialysis.  Over the next few weeks, blood pressure better controlled.     At times became agitated and seen by psychiatry and started on scheduled and as needed Haldol, leading to more compliance with better mentation as well as taking his antihypertensives.     Hospital course complicated by episode of periorbital swelling bilaterally felt to be secondary to subcutaneous emphysema, resolved on steroids.  Also had episode of oral thrush treated with Magic mouthwash.     Patient's assisted living facility willing to take patient back after becomes much stronger at skilled nursing facility.  Skilled nursing facility search still in progress, apparently with no bed offers thus far.   Consultants: Nephrology Critical care Psychiatry   Procedures: Hemodialysis      ASSESSMENT & PLAN:   Active Problems:   Hyperkalemia   ESRD on dialysis [facility])   Mental health disorder   Essential hypertension   Oral thrush   Physical  deconditioning   Malnutrition of moderate degree   Anemia of chronic disease  Hyperkalemia -resolved Continue to follow.   Hypertensive emergency-resolved as of 12/31/2021 Resolved.  See below.   ESRD on dialysis [facility]) Nephrology consulted Continue scheduled dialysis   Mental health disorder -stable Unspecified.  It is unclear if he is ever had a formal diagnosis.   Patient is unable to care for himself.   He is for the most part much more calm and cooperative He is on Tegretol and Artane which he should continue.   As needed Haldol for acute behavior agitation     Essential hypertension -stable Resumed on his home p.o. amlodipine 10 mg daily, clonidine 0.3 mg p.o. 3 times daily, hydralazine 100 mg p.o. 3 times daily, isosorbide mononitrate 60 mg p.o. twice daily, losartan 100 mg daily.   Nephrology added clonidine patches with p.o. clonidine. Minoxidil was then added and titrated up to 10 mg daily.   Oral thrush -resolved 8/23: start nystatin suspension -completed course   COPD exacerbation ([place]) - resolved as of 12/18/2021 On steroids.  Appreciate pulmonary assistance Changed from Solu-Medrol to p.o. prednisone quick taper.   Physical deconditioning PT OT are recommending SNF.  However, this is similar to a previous hospitalization where TOC was unable to find skilled nursing and over time, patient's strength improved that he was able to be discharged back to his assisted living facility. PT/OT to follow however assistd living is refusing to take him back until stint in SNF TOC following    Anemia of chronic disease Hbg stable. Check  CBC ~weekly.   Malnutrition of moderate degree Nutrition Status: Nutrition Problem: Moderate Malnutrition Etiology: chronic illness (ESRD on HD) Signs/Symptoms: mild fat depletion, mild muscle depletion, moderate muscle depletion Interventions: Refer to RD note for recommendations, Nepro shake, MVI, Prostat Body mass index is 27 kg/m.   Nutrition Problem: Moderate Malnutrition Etiology: chronic illness (ESRD on HD)       DVT prophylaxis: heparin Pertinent IV fluids/nutrition: n/a Central lines / invasive devices: n/a  Code Status: FULL Family Communication: none at this time -TOC was having a family meeting to discuss placement concerns  Disposition: inpatient TOC needs: pending SNF Barriers to discharge / significant pending items: placement issue             Subjective:  Patient has no complaints today. Alert, doing well. No concerns from RN.        Objective:  Vitals:   02/06/22 2011 02/07/22 0416 02/07/22 0453 02/07/22 0756  BP: (!) 129/114 (!) 140/113  111/66  Pulse: 61 62  66  Resp: 16 16  17   Temp: 98.7 F (37.1 C) 98.6 F (37 C)  97.9 F (36.6 C)  TempSrc:      SpO2: 97% 97%  99%  Weight:   90.6 kg   Height:        Intake/Output Summary (Last 24 hours) at 02/07/2022 0831 Last data filed at 02/06/2022 2230 Gross per 24 hour  Intake 461.75 ml  Output 3000 ml  Net -2538.25 ml    Filed Weights   02/06/22 0522 02/06/22 0812 02/07/22 0453  Weight: 91.5 kg 90.4 kg 90.6 kg    Examination:  Constitutional:  VS as above General Appearance: NAD, alert  Respiratory: Normal respiratory effort Musculoskeletal:  No clubbing/cyanosis of digits No masses, trachea midline Nontender, no masses Neurological: Alert, normal speech Psychiatric: Normal mood and affect       Scheduled Medications:   (feeding supplement) PROSource Plus  30 mL Oral TID BM   amLODipine  10 mg Oral q morning   calcium acetate  1,334 mg Oral TID WC   carbamazepine  1,000 mg Oral q AM   Chlorhexidine Gluconate Cloth  6 each Topical Daily   cloNIDine  0.2 mg Transdermal Weekly   doxazosin  4 mg Oral QHS   epoetin (EPOGEN/PROCRIT) injection  4,000 Units Intravenous Q M,W,F-HD   ferrous sulfate  325 mg Oral Q breakfast   fluPHENAZine  2.5 mg Oral q AM   heparin  5,000 Units Subcutaneous Q8H    hydrALAZINE  25 mg Oral TID   irbesartan  300 mg Oral Daily   isosorbide mononitrate  60 mg Oral BID   minoxidil  5 mg Oral Daily   mometasone-formoterol  2 puff Inhalation BID   multivitamin  1 tablet Oral QHS   mouth rinse  15 mL Mouth Rinse 4 times per day   pantoprazole  40 mg Oral Daily   patiromer  16.8 g Oral Daily   polyethylene glycol  17 g Oral BID   trihexyphenidyl  2 mg Oral BID WC    Continuous Infusions:  anticoagulant sodium citrate      PRN Medications:  albuterol, alteplase, anticoagulant sodium citrate, diphenhydrAMINE, haloperidol lactate, heparin, hydrALAZINE, lidocaine (PF), lidocaine-prilocaine, ondansetron **OR** ondansetron (ZOFRAN) IV, mouth rinse, pentafluoroprop-tetrafluoroeth, polyethylene glycol, senna-docusate  Antimicrobials:  Anti-infectives (From admission, onward)    None       Data Reviewed: I have personally reviewed following labs and imaging studies  CBC: Recent Labs  Lab 02/02/22  1249 02/04/22 0822  WBC 4.4 3.8*  NEUTROABS  --  2.5  HGB 10.2* 10.9*  HCT 30.9* 31.6*  MCV 102.3* 100.3*  PLT 165 643    Basic Metabolic Panel: Recent Labs  Lab 02/02/22 1249 02/04/22 0822 02/05/22 0511  NA 130* 128* 132*  K 6.2* 5.0 4.6  CL 90* 90* 93*  CO2 26 25 27   GLUCOSE 81 109* 80  BUN 108* 94* 69*  CREATININE 7.03* 6.29* 5.48*  CALCIUM 9.2 9.1 8.6*  PHOS 6.4* 6.5*  --     GFR: Estimated Creatinine Clearance: 15.5 mL/min (A) (by C-G formula based on SCr of 5.48 mg/dL (H)). Liver Function Tests: Recent Labs  Lab 02/02/22 1249 02/04/22 0822  ALBUMIN 3.0* 3.0*    No results for input(s): "LIPASE", "AMYLASE" in the last 168 hours. No results for input(s): "AMMONIA" in the last 168 hours. Coagulation Profile: No results for input(s): "INR", "PROTIME" in the last 168 hours. Cardiac Enzymes: No results for input(s): "CKTOTAL", "CKMB", "CKMBINDEX", "TROPONINI" in the last 168 hours. BNP (last 3 results) No results for input(s):  "PROBNP" in the last 8760 hours. HbA1C: No results for input(s): "HGBA1C" in the last 72 hours. CBG: No results for input(s): "GLUCAP" in the last 168 hours. Lipid Profile: No results for input(s): "CHOL", "HDL", "LDLCALC", "TRIG", "CHOLHDL", "LDLDIRECT" in the last 72 hours. Thyroid Function Tests: No results for input(s): "TSH", "T4TOTAL", "FREET4", "T3FREE", "THYROIDAB" in the last 72 hours. Anemia Panel: No results for input(s): "VITAMINB12", "FOLATE", "FERRITIN", "TIBC", "IRON", "RETICCTPCT" in the last 72 hours. Urine analysis: No results found for: "COLORURINE", "APPEARANCEUR", "LABSPEC", "PHURINE", "GLUCOSEU", "HGBUR", "BILIRUBINUR", "KETONESUR", "PROTEINUR", "UROBILINOGEN", "NITRITE", "LEUKOCYTESUR" Sepsis Labs: @LABRCNTIP (procalcitonin:4,lacticidven:4)  No results found for this or any previous visit (from the past 240 hour(s)).       Radiology Studies: CT HEAD WO CONTRAST (5MM)  Result Date: 12/15/2021 CLINICAL DATA:  Altered mental status EXAM: CT HEAD WITHOUT CONTRAST TECHNIQUE: Contiguous axial images were obtained from the base of the skull through the vertex without intravenous contrast. RADIATION DOSE REDUCTION: This exam was performed according to the departmental dose-optimization program which includes automated exposure control, adjustment of the mA and/or kV according to patient size and/or use of iterative reconstruction technique. COMPARISON:  None Available. FINDINGS: Brain: No evidence of acute cortical infarction, hemorrhage, cerebral edema, mass, mass effect, or midline shift. No hydrocephalus or extra-axial fluid collection. 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 Periventricular white matter changes, likely the sequela of chronic small vessel ischemic disease. Vascular: No hyperdense vessel. Skull: Normal. Negative for fracture or focal lesion. Sinuses/Orbits: No acute finding. Other: The mastoid air cells are well aerated.  IMPRESSION: No acute intracranial process. Electronically Signed   By: Merilyn Baba M.D.   On: 12/15/2021 14:22   DG Chest Portable 1 View  Result Date: 12/15/2021 CLINICAL DATA:  Altered mental status. EXAM: PORTABLE CHEST 1 VIEW COMPARISON:  None Available. FINDINGS: Mild cardiomegaly is noted. Right internal jugular dialysis catheter is noted with distal tip in expected position of cavoatrial junction. Both lungs are clear. The visualized skeletal structures are unremarkable. IMPRESSION: No active disease. Electronically Signed   By: Marijo Conception M.D.   On: 12/15/2021 14:18            LOS: 57 days       Emeterio Reeve, DO [facility] 02/07/2022, 8:31 AM   Staff may message me via secure chat in [place]  but this may not receive  immediate response,  please page for urgent matters!  If 7PM-7AM, please contact night-coverage [email or web address]  Dictation software was used to generate the above note. Typos may occur and escape review, as with typed/written notes. Please contact Dr Sheppard Coil directly for clarity if needed.

## 2022-02-07 NOTE — Evaluation (Signed)
Physical Therapy Re-Evaluation Patient Details Name: Todd Brown MRN: [id number] DOB: [birth year] Today's Date: 02/07/2022  History of Present Illness  61 year old male with past medical history of hypertension and end-stage renal disease on hemodialysis presented to the emergency room from his group home/skilled nursing facility on 7/17 with confusion.  (Patient's baseline is that he is unable to care for himself and has a legal guardian.)  Patient last received dialysis on Friday, 7/14.  From being in the emergency room on 7/17, unable to get dialysis yesterday.  Patient found to have markedly elevated blood pressure with a systolic in the 191Y.  Patient with questionable cardiac ischemia as well.  Clinical Impression  PT re-evaluation performed d/t extended hospitalization.  Prior to hospital admission, per chart review pt was CGA stand pivot bed to manual w/c at ALF.  No c/o pain during session.  Difficult to understand pt's speech intermittently.  Currently pt is SBA semi-supine to sitting edge of bed (increased time and cueing for pt to perform on own); min assist with transfers using RW; and min assist to ambulate 3 feet with RW bed to recliner.  Pt did need extra time and cueing for activities.  Pt would benefit from skilled PT to address noted impairments and functional limitations (see below for any additional details).  Upon hospital discharge, pt would benefit from SNF (or back to ALF with 24/7 assist for mobility).  PT POC reviewed and updated as appropriate.   Recommendations for follow up therapy are one component of a multi-disciplinary discharge planning process, led by the attending physician.  Recommendations may be updated based on patient status, additional functional criteria and insurance authorization.  Follow Up Recommendations Skilled nursing-short term rehab (<3 hours/day) Can patient physically be transported by private vehicle: No    Assistance Recommended at Discharge  Frequent or constant Supervision/Assistance  Patient can return home with the following  Two people to help with walking and/or transfers;Two people to help with bathing/dressing/bathroom;Help with stairs or ramp for entrance;Assist for transportation;Assistance with cooking/housework    Equipment Recommendations Rolling walker (2 wheels);BSC/3in1;Wheelchair cushion (measurements PT);Wheelchair (measurements PT)  Recommendations for Other Services       Functional Status Assessment Patient has had a recent decline in their functional status and/or demonstrates limited ability to make significant improvements in function in a reasonable and predictable amount of time     Precautions / Restrictions Precautions Precautions: Fall Precaution Comments: vision impaired Restrictions Weight Bearing Restrictions: No RLE Weight Bearing: Weight bearing as tolerated LLE Weight Bearing: Weight bearing as tolerated      Mobility  Bed Mobility Overal bed mobility: Needs Assistance Bed Mobility: Supine to Sit     Supine to sit: Supervision, HOB elevated     General bed mobility comments: increased effort and time for pt to perform on own; vc's required for technique    Transfers Overall transfer level: Needs assistance Equipment used: Rolling walker (2 wheels) Transfers: Sit to/from Stand Sit to Stand: Min assist           General transfer comment: vc's and tactile cues for technique    Ambulation/Gait Ambulation/Gait assistance: Min assist Gait Distance (Feet): 3 Feet (bed to recliner) Assistive device: Rolling walker (2 wheels)   Gait velocity: decreased     General Gait Details: vc's and tactile cues for mobility and directions  Stairs            Wheelchair Mobility    Modified Rankin (Stroke Patients Only)  Balance Overall balance assessment: Needs assistance Sitting-balance support: No upper extremity supported, Feet supported Sitting balance-Leahy  Scale: Fair Sitting balance - Comments: steady static sitting edge of bed   Standing balance support: Bilateral upper extremity supported, During functional activity Standing balance-[name]: Poor Standing balance comment: assist for safety in standing                             Pertinent Vitals/Pain Pain Assessment Pain Assessment: Faces Faces Pain Scale: No hurt Pain Intervention(s): Limited activity within patient's tolerance, Monitored during session, Repositioned Pt's O2 decreased to 85 to 86% on 1 L O2 after getting to recliner; therapist increased O2 to 2 L via nasal cannula to increase O2 to 92% end of session (nurse notified).    Home Living Family/patient expects to be discharged to:: Assisted living                   Additional Comments: Per prior therapy notes "pt lives at [facility], per chart and previous admission pt SPT to bed/mwc with CGA; Per chart review assist from ALF staff for ADL"    Prior Function Prior Level of Function : Needs assist             Mobility Comments: stand pivots to wheelchair at baseline ADLs Comments: Per chart review assist from ALF staff for ADL     Hand Dominance        Extremity/Trunk Assessment   Upper Extremity Assessment Upper Extremity Assessment: Generalized weakness    Lower Extremity Assessment Lower Extremity Assessment: Generalized weakness       Communication   Communication: HOH  Cognition Arousal/Alertness: Awake/alert Behavior During Therapy: Flat affect Overall Cognitive Status: Difficult to assess Area of Impairment: Orientation, Attention, Memory, Following commands, Safety/judgement, Awareness, Problem solving                 Orientation Level: Disoriented to, Situation, Time Current Attention Level: Alternating Memory: Decreased short-term memory Following Commands: Follows one step commands with increased time, Follows one step commands  inconsistently Safety/Judgement: Decreased awareness of safety, Decreased awareness of deficits Awareness: Intellectual Problem Solving: Slow processing, Decreased initiation, Difficulty sequencing, Requires verbal cues, Requires tactile cues General Comments: Increased time and cueing to follow one step cues        General Comments  Nursing cleared pt for participation in physical therapy.  Pt agreeable to PT session.    Exercises     Assessment/Plan    PT Assessment Patient needs continued PT services  PT Problem List Decreased strength;Decreased balance;Decreased mobility;Decreased activity tolerance;Decreased cognition;Decreased safety awareness;Decreased knowledge of use of DME       PT Treatment Interventions DME instruction;Therapeutic activities;Therapeutic exercise;Functional mobility training;Patient/family education    PT Goals (Current goals can be found in the Care Plan section)  Acute Rehab PT Goals Patient Stated Goal: none stated PT Goal Formulation: Patient unable to participate in goal setting Time For Goal Achievement: 02/21/22 Potential to Achieve Goals: Fair    Frequency Min 2X/week     Co-evaluation               AM-PAC PT "6 Clicks" Mobility  Outcome Measure Help needed turning from your back to your side while in a flat bed without using bedrails?: None Help needed moving from lying on your back to sitting on the side of a flat bed without using bedrails?: A Little Help needed moving to and from  a bed to a chair (including a wheelchair)?: A Little Help needed standing up from a chair using your arms (e.g., wheelchair or bedside chair)?: A Little Help needed to walk in hospital room?: A Lot Help needed climbing 3-5 steps with a railing? : Total 6 Click Score: 16    End of Session Equipment Utilized During Treatment: Gait belt Activity Tolerance: Patient tolerated treatment well Patient left: in chair;with call bell/phone within reach;with  chair alarm set;with nursing/sitter in room Nurse Communication: Mobility status;Precautions;Other (comment) (pt on 2 L at 92%) PT Visit Diagnosis: Other abnormalities of gait and mobility (R26.89);Muscle weakness (generalized) (M62.81);Unsteadiness on feet (R26.81)    Time: 1050-1109 PT Time Calculation (min) (ACUTE ONLY): 19 min   Charges:   PT Evaluation $PT Re-evaluation: 1 Re-eval         Leitha Bleak, PT 02/07/22, 12:58 PM

## 2022-02-07 NOTE — Progress Notes (Signed)
Noted noted to be increasingly yelling out for food and water, despite being aware of fluid restriction. Patient delivered dinner tray by dinning staff who instructed patient it was in the room and a staff member would be in to assist him with eating as soon as possible. Patient immediately began yelling louder. RN noticed patient was scooted to the edge of recliner chair attempting to get out of it when walking past. RN immediately called for assisted and patient was prevented from falling to the floor and able to be lifted back into position in the chair. Patient's vital signs obtained and assisted back to bed with staff x2. Patient noted to have dry fecal matter to rectal and peri area. Patient given full bed bath with gown change prior to being assisted with meal tray.

## 2022-02-08 LAB — BASIC METABOLIC PANEL
Anion gap: 10 (ref 5–15)
BUN: 70 mg/dL — ABNORMAL HIGH (ref 8–23)
CO2: 27 mmol/L (ref 22–32)
Calcium: 9.1 mg/dL (ref 8.9–10.3)
Chloride: 96 mmol/L — ABNORMAL LOW (ref 98–111)
Creatinine, Ser: 5.5 mg/dL — ABNORMAL HIGH (ref 0.61–1.24)
GFR, Estimated: 11 mL/min — ABNORMAL LOW (ref >60)
Glucose, Bld: 69 mg/dL — ABNORMAL LOW (ref 70–99)
Potassium: 4.7 mmol/L (ref 3.5–5.1)
Sodium: 133 mmol/L — ABNORMAL LOW (ref 135–145)

## 2022-02-08 LAB — GLUCOSE, CAPILLARY
Glucose-Capillary: 70 mg/dL (ref 70–99)
Glucose-Capillary: 110 mg/dL — ABNORMAL HIGH (ref 70–99)
Glucose-Capillary: 62 mg/dL — ABNORMAL LOW (ref 70–99)

## 2022-02-08 MED ORDER — GLUCOSE 40 % PO GEL
ORAL | Status: AC
  Administered 2022-02-08: 31 g
  Filled 2022-02-08: qty 1

## 2022-02-08 MED ORDER — GLUCOSE 40 % PO GEL: 1.0000 | Freq: Once | ORAL | Status: DC

## 2022-02-08 NOTE — Progress Notes (Addendum)
Hypoglycemic Event  CBG: 70  Treatment: 1 tube glucose gel  Symptoms: hungry  Follow-up CBG: Time:1013 CBG Result:110  Possible Reasons for Event: pt given PRN medications on previous night shift  Comments/MD notified: upon review of 02/08/22 0454 lab results pt's serum glucose was 69; pt lethargic but arousable followed Hypoglycemia algorithm pulling Pyxis override for 1 tube glucose gel (pt on fluid restriction) at 0748; FSBS at 0738 = 70; rechecked FSBS at 0939 = 62, pt to be fed breakfast; pt ate breakfast being fed by NT; pt took medications whole in applesauce; pt's FSBS at 1013 = [place]

## 2022-02-08 NOTE — Progress Notes (Signed)
PROGRESS NOTE    Todd Brown   WPY:[id number] DOB: 22-Apr-1961  DOA: 12/15/2021 Date of Service: 02/08/22 PCP: Jodi Marble, MD     Brief Narrative / Hospital Course:  61 year old male with past medical history of hypertension and end-stage renal disease on hemodialysis presented to the emergency room from his group home/Assisted living facility on 7/17 with confusion.  (Patient's baseline was that he is unable to care for himself and has a legal guardian.)  Patient last received dialysis on Friday, 7/14.     Patient found to have markedly elevated blood pressure with a systolic in the 053Z requiring Cardene drip and placement of patient to the ICU.  Patient with questionable cardiac ischemia as well.  CT scan of head and neurological exam unremarkable.     Patient was admitted to the hospitalist service and started on his oral antihypertensives, nicardipine drip and given IV labetalol and as needed IV hydralazine.  Nephrology consulted and resumed patient's dialysis.  Over the next few weeks, blood pressure better controlled.     At times became agitated and seen by psychiatry and started on scheduled and as needed Haldol, leading to more compliance with better mentation as well as taking his antihypertensives.     Hospital course complicated by episode of periorbital swelling bilaterally felt to be secondary to subcutaneous emphysema, resolved on steroids.  Also had episode of oral thrush treated with Magic mouthwash.     Patient's assisted living facility willing to take patient back after becomes much stronger at skilled nursing facility.  Skilled nursing facility search still in progress, apparently with no bed offers thus far.   Consultants: Nephrology Critical care Psychiatry   Procedures: Hemodialysis      ASSESSMENT & PLAN:   Active Problems:   Hyperkalemia   ESRD on dialysis [facility])   Mental health disorder   Essential hypertension   Oral thrush   Physical  deconditioning   Malnutrition of moderate degree   Anemia of chronic disease  Hyperkalemia -resolved Continue to follow.   Hypertensive emergency-resolved as of 12/31/2021 Resolved.  See below.   ESRD on dialysis [facility]) Nephrology consulted Continue scheduled dialysis   Mental health disorder -stable Unspecified.  It is unclear if he is ever had a formal diagnosis.   Patient is unable to care for himself.   He is for the most part much more calm and cooperative He is on Tegretol and Artane which he should continue.   As needed Haldol for acute behavior agitation     Essential hypertension -stable Resumed on his home p.o. amlodipine 10 mg daily, clonidine 0.3 mg p.o. 3 times daily, hydralazine 100 mg p.o. 3 times daily, isosorbide mononitrate 60 mg p.o. twice daily, losartan 100 mg daily.   Nephrology added clonidine patches with p.o. clonidine. Minoxidil was then added and titrated up to 10 mg daily.   Oral thrush -resolved 8/23: start nystatin suspension -completed course   COPD exacerbation ([place]) - resolved as of 12/18/2021 On steroids.  Appreciate pulmonary assistance Changed from Solu-Medrol to p.o. prednisone quick taper.   Physical deconditioning PT OT are recommending SNF.  However, this is similar to a previous hospitalization where TOC was unable to find skilled nursing and over time, patient's strength improved that he was able to be discharged back to his assisted living facility. PT/OT to follow however assistd living is refusing to take him back until stint in SNF TOC following    Anemia of chronic disease Hbg stable. Check  CBC ~weekly.   Malnutrition of moderate degree Nutrition Status: Nutrition Problem: Moderate Malnutrition Etiology: chronic illness (ESRD on HD) Signs/Symptoms: mild fat depletion, mild muscle depletion, moderate muscle depletion Interventions: Refer to RD note for recommendations, Nepro shake, MVI, Prostat Body mass index is 27 kg/m.   Nutrition Problem: Moderate Malnutrition Etiology: chronic illness (ESRD on HD)    Hypoglycemia Poor po intake Treated without complication Monitor    DVT prophylaxis: heparin Pertinent IV fluids/nutrition: n/a Central lines / invasive devices: n/a  Code Status: FULL Family Communication: none at this time -TOC was having a family meeting to discuss placement concerns  Disposition: inpatient TOC needs: pending SNF Barriers to discharge / significant pending items: placement issue             Subjective:  Patient has no complaints today. Alert, doing well. No concerns from RN.        Objective:  Vitals:   02/07/22 2106 02/07/22 2338 02/08/22 0500 02/08/22 0815  BP: (!) 192/82 (!) 173/93  (!) 151/68  Pulse: 72 76  71  Resp: 19   17  Temp: 97.6 F (36.4 C)   98.4 F (36.9 C)  TempSrc:      SpO2: 98%   98%  Weight:   90.6 kg   Height:        Intake/Output Summary (Last 24 hours) at 02/08/2022 1105 Last data filed at 02/08/2022 0749 Gross per 24 hour  Intake 484 ml  Output --  Net 484 ml    Filed Weights   02/06/22 0812 02/07/22 0453 02/08/22 0500  Weight: 90.4 kg 90.6 kg 90.6 kg    Examination:  Constitutional:  VS as above General Appearance: NAD, alert  Respiratory: Normal respiratory effort Musculoskeletal:  No clubbing/cyanosis of digits No masses, trachea midline Nontender, no masses Neurological: Alert, normal speech Psychiatric: Normal mood and affect       Scheduled Medications:   (feeding supplement) PROSource Plus  30 mL Oral TID BM   amLODipine  10 mg Oral q morning   calcium acetate  1,334 mg Oral TID WC   carbamazepine  1,000 mg Oral q AM   Chlorhexidine Gluconate Cloth  6 each Topical Daily   cloNIDine  0.2 mg Transdermal Weekly   dextrose  1 Tube Oral Once   doxazosin  4 mg Oral QHS   epoetin (EPOGEN/PROCRIT) injection  4,000 Units Intravenous Q M,W,F-HD   ferrous sulfate  325 mg Oral Q breakfast    fluPHENAZine  2.5 mg Oral q AM   heparin  5,000 Units Subcutaneous Q8H   hydrALAZINE  25 mg Oral TID   irbesartan  300 mg Oral Daily   isosorbide mononitrate  60 mg Oral BID   minoxidil  5 mg Oral Daily   mometasone-formoterol  2 puff Inhalation BID   multivitamin  1 tablet Oral QHS   mouth rinse  15 mL Mouth Rinse 4 times per day   pantoprazole  40 mg Oral Daily   patiromer  16.8 g Oral Daily   polyethylene glycol  17 g Oral BID   trihexyphenidyl  2 mg Oral BID WC    Continuous Infusions:  anticoagulant sodium citrate      PRN Medications:  albuterol, alteplase, anticoagulant sodium citrate, diphenhydrAMINE, haloperidol lactate, heparin, hydrALAZINE, lidocaine (PF), lidocaine-prilocaine, ondansetron **OR** ondansetron (ZOFRAN) IV, mouth rinse, pentafluoroprop-tetrafluoroeth, polyethylene glycol, senna-docusate  Antimicrobials:  Anti-infectives (From admission, onward)    None       Data Reviewed: I have personally  reviewed following labs and imaging studies  CBC: Recent Labs  Lab 02/02/22 1249 02/04/22 0822  WBC 4.4 3.8*  NEUTROABS  --  2.5  HGB 10.2* 10.9*  HCT 30.9* 31.6*  MCV 102.3* 100.3*  PLT 165 119    Basic Metabolic Panel: Recent Labs  Lab 02/02/22 1249 02/04/22 0822 02/05/22 0511 02/08/22 0454  NA 130* 128* 132* 133*  K 6.2* 5.0 4.6 4.7  CL 90* 90* 93* 96*  CO2 26 25 27 27   GLUCOSE 81 109* 80 69*  BUN 108* 94* 69* 70*  CREATININE 7.03* 6.29* 5.48* 5.50*  CALCIUM 9.2 9.1 8.6* 9.1  PHOS 6.4* 6.5*  --   --     GFR: Estimated Creatinine Clearance: 15.5 mL/min (A) (by C-G formula based on SCr of 5.5 mg/dL (H)). Liver Function Tests: Recent Labs  Lab 02/02/22 1249 02/04/22 0822  ALBUMIN 3.0* 3.0*    No results for input(s): "LIPASE", "AMYLASE" in the last 168 hours. No results for input(s): "AMMONIA" in the last 168 hours. Coagulation Profile: No results for input(s): "INR", "PROTIME" in the last 168 hours. Cardiac Enzymes: No results  for input(s): "CKTOTAL", "CKMB", "CKMBINDEX", "TROPONINI" in the last 168 hours. BNP (last 3 results) No results for input(s): "PROBNP" in the last 8760 hours. HbA1C: No results for input(s): "HGBA1C" in the last 72 hours. CBG: Recent Labs  Lab 02/08/22 0738 02/08/22 0939 02/08/22 1013  GLUCAP 70 62* 110*   Lipid Profile: No results for input(s): "CHOL", "HDL", "LDLCALC", "TRIG", "CHOLHDL", "LDLDIRECT" in the last 72 hours. Thyroid Function Tests: No results for input(s): "TSH", "T4TOTAL", "FREET4", "T3FREE", "THYROIDAB" in the last 72 hours. Anemia Panel: No results for input(s): "VITAMINB12", "FOLATE", "FERRITIN", "TIBC", "IRON", "RETICCTPCT" in the last 72 hours. Urine analysis: No results found for: "COLORURINE", "APPEARANCEUR", "LABSPEC", "PHURINE", "GLUCOSEU", "HGBUR", "BILIRUBINUR", "KETONESUR", "PROTEINUR", "UROBILINOGEN", "NITRITE", "LEUKOCYTESUR" Sepsis Labs: @LABRCNTIP (procalcitonin:4,lacticidven:4)  No results found for this or any previous visit (from the past 240 hour(s)).       Radiology Studies: CT HEAD WO CONTRAST (5MM)  Result Date: 12/15/2021 CLINICAL DATA:  Altered mental status EXAM: CT HEAD WITHOUT CONTRAST TECHNIQUE: Contiguous axial images were obtained from the base of the skull through the vertex without intravenous contrast. RADIATION DOSE REDUCTION: This exam was performed according to the departmental dose-optimization program which includes automated exposure control, adjustment of the mA and/or kV according to patient size and/or use of iterative reconstruction technique. COMPARISON:  None Available. FINDINGS: Brain: No evidence of acute cortical infarction, hemorrhage, cerebral edema, mass, mass effect, or midline shift. No hydrocephalus or extra-axial fluid collection. 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 Periventricular white matter changes, likely the sequela of chronic small vessel ischemic disease.  Vascular: No hyperdense vessel. Skull: Normal. Negative for fracture or focal lesion. Sinuses/Orbits: No acute finding. Other: The mastoid air cells are well aerated. IMPRESSION: No acute intracranial process. Electronically Signed   By: Merilyn Baba M.D.   On: 12/15/2021 14:22   DG Chest Portable 1 View  Result Date: 12/15/2021 CLINICAL DATA:  Altered mental status. EXAM: PORTABLE CHEST 1 VIEW COMPARISON:  None Available. FINDINGS: Mild cardiomegaly is noted. Right internal jugular dialysis catheter is noted with distal tip in expected position of cavoatrial junction. Both lungs are clear. The visualized skeletal structures are unremarkable. IMPRESSION: No active disease. Electronically Signed   By: Marijo Conception M.D.   On: 12/15/2021 14:18            LOS:  8 days       Emeterio Reeve, DO [facility] 02/08/2022, 11:05 AM   Staff may message me via secure chat in [place]  but this may not receive immediate response,  please page for urgent matters!  If 7PM-7AM, please contact night-coverage [email or web address]  Dictation software was used to generate the above note. Typos may occur and escape review, as with typed/written notes. Please contact Dr Sheppard Coil directly for clarity if needed.

## 2022-02-08 NOTE — Progress Notes (Signed)
[facility]  PROGRESS NOTE   Subjective:   Awake and alert.  Had some lunch today.  Comfortable  Objective:  Vital signs: Blood pressure (!) 151/68, pulse 71, temperature 98.4 F (36.9 C), resp. rate 17, height 6' (1.829 m), weight 90.6 kg, SpO2 98 %.  Intake/Output Summary (Last 24 hours) at 02/08/2022 1432 Last data filed at 02/08/2022 1404 Gross per 24 hour  Intake 484 ml  Output --  Net 484 ml   Filed Weights   02/06/22 0812 02/07/22 0453 02/08/22 0500  Weight: 90.4 kg 90.6 kg 90.6 kg     Physical Exam: General:  No acute distress  Head:  Normocephalic, atraumatic. Moist oral mucosal membranes  Eyes:  Anicteric  Neck:  Supple  Lungs:   Clear to auscultation, normal effort  Heart:  S1S2 no rubs  Abdomen:   Soft, nontender, bowel sounds present  Extremities:  peripheral edema.  Neurologic:  Awake, alert, following commands  Skin:  No lesions  Access:     Basic Metabolic Panel: Recent Labs  Lab 02/02/22 1249 02/04/22 0822 02/05/22 0511 02/08/22 0454  NA 130* 128* 132* 133*  K 6.2* 5.0 4.6 4.7  CL 90* 90* 93* 96*  CO2 26 25 27 27   GLUCOSE 81 109* 80 69*  BUN 108* 94* 69* 70*  CREATININE 7.03* 6.29* 5.48* 5.50*  CALCIUM 9.2 9.1 8.6* 9.1  PHOS 6.4* 6.5*  --   --     CBC: Recent Labs  Lab 02/02/22 1249 02/04/22 0822  WBC 4.4 3.8*  NEUTROABS  --  2.5  HGB 10.2* 10.9*  HCT 30.9* 31.6*  MCV 102.3* 100.3*  PLT 165 167     Urinalysis: No results for input(s): "COLORURINE", "LABSPEC", "PHURINE", "GLUCOSEU", "HGBUR", "BILIRUBINUR", "KETONESUR", "PROTEINUR", "UROBILINOGEN", "NITRITE", "LEUKOCYTESUR" in the last 72 hours.  Invalid input(s): "APPERANCEUR"    Imaging: No results found.   Medications:    anticoagulant sodium citrate      (feeding supplement) PROSource Plus  30 mL Oral TID BM   amLODipine  10 mg Oral q morning   calcium acetate  1,334 mg Oral TID WC   carbamazepine  1,000 mg Oral q AM   Chlorhexidine Gluconate Cloth   6 each Topical Daily   cloNIDine  0.2 mg Transdermal Weekly   dextrose  1 Tube Oral Once   doxazosin  4 mg Oral QHS   epoetin (EPOGEN/PROCRIT) injection  4,000 Units Intravenous Q M,W,F-HD   ferrous sulfate  325 mg Oral Q breakfast   fluPHENAZine  2.5 mg Oral q AM   heparin  5,000 Units Subcutaneous Q8H   hydrALAZINE  25 mg Oral TID   irbesartan  300 mg Oral Daily   isosorbide mononitrate  60 mg Oral BID   minoxidil  5 mg Oral Daily   mometasone-formoterol  2 puff Inhalation BID   multivitamin  1 tablet Oral QHS   mouth rinse  15 mL Mouth Rinse 4 times per day   pantoprazole  40 mg Oral Daily   patiromer  16.8 g Oral Daily   polyethylene glycol  17 g Oral BID   trihexyphenidyl  2 mg Oral BID WC    Assessment/ Plan:     Active Problems:   Essential hypertension   ESRD on dialysis [facility])   Mental health disorder   Physical deconditioning   Oral thrush   Malnutrition of moderate degree   Hyperkalemia   Anemia of chronic disease  61 year old male with history of hypertension, anemia,  schizophrenia, end-stage renal disease on hemodialysis on Monday Wednesday Friday schedule now admitted with history of mental status changes and accelerated hypertension.   #1: End-stage renal disease: Patient is stable dialysis friday with 3 L fluid removal.  Scheduled for dialysis tomorrow morning.   #2: Anemia: We will continue the anemia protocol with Procrit.   #3: Secondary parathyroidism: Patient has been on PhosLo.   #4: Hypertension: Patient has poorly controlled hypertension he has been on minoxidil, isosorbide, irbesartan, amlodipine, clonidine, hydralazine and doxazosin.  Blood pressure is well controlled at this time.   Continue supportive care.  Discharge planning in progress for placement.   LOS: [name] 9/10/20232:32 PM

## 2022-02-09 LAB — GLUCOSE, CAPILLARY: Glucose-Capillary: 72 mg/dL (ref 70–99)

## 2022-02-09 NOTE — Progress Notes (Signed)
PT Cancellation Note  Patient Details Name: Todd Brown MRN: [id number] DOB: 1960-06-25   Cancelled Treatment:    Reason Eval/Treat Not Completed: Patient at procedure or test/unavailable (Patient at dialysis earlier today and now having assistance from nurse tech with lunch. PT to continue with attempts)  [name], PT, MPT  [name] 02/09/2022, 1:34 PM

## 2022-02-09 NOTE — Progress Notes (Signed)
HD treatment of 3.5 hours today with uf of 3 liters completed without complication. BVP 84 Post V/s BP 127/76, P 67, O2 sat 97,T 98.44f Post HD report given to primary, RN

## 2022-02-09 NOTE — Progress Notes (Signed)
PROGRESS NOTE    Todd Brown   XWR:[id number] DOB: 1960-11-25  DOA: 12/15/2021 Date of Service: 02/09/22 PCP: Jodi Marble, MD     Brief Narrative / Hospital Course:  61 year old male with past medical history of hypertension and end-stage renal disease on hemodialysis presented to the emergency room from his group home/Assisted living facility on 7/17 with confusion.  (Patient's baseline was that he is unable to care for himself and has a legal guardian.)  Patient last received dialysis on Friday, 7/14.     Patient found to have markedly elevated blood pressure with a systolic in the 191Y requiring Cardene drip and placement of patient to the ICU.  Patient with questionable cardiac ischemia as well.  CT scan of head and neurological exam unremarkable.     Patient was admitted to the hospitalist service and started on his oral antihypertensives, nicardipine drip and given IV labetalol and as needed IV hydralazine.  Nephrology consulted and resumed patient's dialysis.  Over the next few weeks, blood pressure better controlled.     At times became agitated and seen by psychiatry and started on scheduled and as needed Haldol, leading to more compliance with better mentation as well as taking his antihypertensives.     Hospital course complicated by episode of periorbital swelling bilaterally felt to be secondary to subcutaneous emphysema, resolved on steroids.  Also had episode of oral thrush treated with Magic mouthwash.     Patient's assisted living facility willing to take patient back after becomes much stronger at skilled nursing facility.  Skilled nursing facility search still in progress, apparently with no bed offers thus far.   Consultants: Nephrology Critical care Psychiatry   Procedures: Hemodialysis      ASSESSMENT & PLAN:   Active Problems:   Hyperkalemia   ESRD on dialysis [facility])   Mental health disorder   Essential hypertension   Oral thrush   Physical  deconditioning   Malnutrition of moderate degree   Anemia of chronic disease  Hyperkalemia -resolved Continue to follow periodic BMP Continuing dialysis    Hypertensive emergency-resolved as of 12/31/2021 Resolved.  See below.   ESRD on dialysis [facility]) Nephrology consulted Continue scheduled dialysis   Mental health disorder -stable Unspecified.  It is unclear if he is ever had a formal diagnosis.   Patient is unable to care for himself.   He is for the most part much more calm and cooperative He is on Tegretol and Artane which he should continue.   As needed Haldol for acute behavior agitation   Placement pending, today 02/09/22 is hospital day 56   Essential hypertension -stable Resumed on his home p.o. amlodipine 10 mg daily, clonidine 0.3 mg p.o. 3 times daily, hydralazine 100 mg p.o. 3 times daily, isosorbide mononitrate 60 mg p.o. twice daily, losartan 100 mg daily.   Nephrology added clonidine patches with p.o. clonidine. Minoxidil was then added and titrated up to 10 mg daily.   Oral thrush -resolved 8/23: start nystatin suspension -completed course   COPD exacerbation ([place]) - resolved as of 12/18/2021 Monitor O2 w/ VS   Physical deconditioning PT OT are recommending SNF.  However, this is similar to a previous hospitalization where TOC was unable to find skilled nursing and over time, patient's strength improved that he was able to be discharged back to his assisted living facility. PT/OT to follow however assistd living is refusing to take him back until stint in SNF TOC following    Anemia of chronic disease  Hbg stable. Check CBC ~weekly.   Malnutrition of moderate degree Nutrition Status: Nutrition Problem: Moderate Malnutrition Etiology: chronic illness (ESRD on HD) Signs/Symptoms: mild fat depletion, mild muscle depletion, moderate muscle depletion Interventions: Refer to RD note for recommendations, Nepro shake, MVI, Prostat Body mass index is 27 kg/m.   Nutrition Problem: Moderate Malnutrition Etiology: chronic illness (ESRD on HD)    Hypoglycemia - resolved Poor po intake Treated without complication Monitor    DVT prophylaxis: heparin Pertinent IV fluids/nutrition: n/a Central lines / invasive devices: n/a  Code Status: FULL Family Communication: none at this time -TOC was having a family meeting to discuss placement concerns  Disposition: inpatient TOC needs: pending SNF Barriers to discharge / significant pending items: placement issue             Subjective:  Patient has no complaints today.  No concerns from RN.        Objective:  Vitals:   02/08/22 0815 02/09/22 0448 02/09/22 0749 02/09/22 0803  BP: (!) 151/68 (!) 147/86 (!) 169/83   Pulse: 71 62 63 62  Resp: 17 (!) 21 14 12   Temp: 98.4 F (36.9 C) 98.1 F (36.7 C) 98.5 F (36.9 C)   TempSrc:   Oral   SpO2: 98% 98% 97% 97%  Weight:   93.4 kg   Height:        Intake/Output Summary (Last 24 hours) at 02/09/2022 0824 Last data filed at 02/09/2022 0630 Gross per 24 hour  Intake 703 ml  Output --  Net 703 ml    Filed Weights   02/07/22 0453 02/08/22 0500 02/09/22 0749  Weight: 90.6 kg 90.6 kg 93.4 kg    Examination:  Constitutional:  VS as above General Appearance: NAD, alert  Respiratory: Normal respiratory effort Musculoskeletal:  No clubbing/cyanosis of digits No masses, trachea midline Nontender, no masses Neurological: Alert, normal speech Psychiatric: Normal mood and affect       Scheduled Medications:   (feeding supplement) PROSource Plus  30 mL Oral TID BM   amLODipine  10 mg Oral q morning   calcium acetate  1,334 mg Oral TID WC   carbamazepine  1,000 mg Oral q AM   Chlorhexidine Gluconate Cloth  6 each Topical Daily   cloNIDine  0.2 mg Transdermal Weekly   dextrose  1 Tube Oral Once   doxazosin  4 mg Oral QHS   epoetin (EPOGEN/PROCRIT) injection  4,000 Units Intravenous Q M,W,F-HD   ferrous sulfate  325  mg Oral Q breakfast   fluPHENAZine  2.5 mg Oral q AM   heparin  5,000 Units Subcutaneous Q8H   hydrALAZINE  25 mg Oral TID   irbesartan  300 mg Oral Daily   isosorbide mononitrate  60 mg Oral BID   minoxidil  5 mg Oral Daily   mometasone-formoterol  2 puff Inhalation BID   multivitamin  1 tablet Oral QHS   mouth rinse  15 mL Mouth Rinse 4 times per day   pantoprazole  40 mg Oral Daily   patiromer  16.8 g Oral Daily   polyethylene glycol  17 g Oral BID   trihexyphenidyl  2 mg Oral BID WC    Continuous Infusions:  anticoagulant sodium citrate      PRN Medications:  albuterol, alteplase, anticoagulant sodium citrate, diphenhydrAMINE, haloperidol lactate, heparin, hydrALAZINE, lidocaine (PF), lidocaine-prilocaine, ondansetron **OR** ondansetron (ZOFRAN) IV, mouth rinse, pentafluoroprop-tetrafluoroeth, polyethylene glycol, senna-docusate  Antimicrobials:  Anti-infectives (From admission, onward)    None  Data Reviewed: I have personally reviewed following labs and imaging studies  CBC: Recent Labs  Lab 02/02/22 1249 02/04/22 0822  WBC 4.4 3.8*  NEUTROABS  --  2.5  HGB 10.2* 10.9*  HCT 30.9* 31.6*  MCV 102.3* 100.3*  PLT 165 643    Basic Metabolic Panel: Recent Labs  Lab 02/02/22 1249 02/04/22 0822 02/05/22 0511 02/08/22 0454  NA 130* 128* 132* 133*  K 6.2* 5.0 4.6 4.7  CL 90* 90* 93* 96*  CO2 26 25 27 27   GLUCOSE 81 109* 80 69*  BUN 108* 94* 69* 70*  CREATININE 7.03* 6.29* 5.48* 5.50*  CALCIUM 9.2 9.1 8.6* 9.1  PHOS 6.4* 6.5*  --   --     GFR: Estimated Creatinine Clearance: 16.7 mL/min (A) (by C-G formula based on SCr of 5.5 mg/dL (H)). Liver Function Tests: Recent Labs  Lab 02/02/22 1249 02/04/22 0822  ALBUMIN 3.0* 3.0*    No results for input(s): "LIPASE", "AMYLASE" in the last 168 hours. No results for input(s): "AMMONIA" in the last 168 hours. Coagulation Profile: No results for input(s): "INR", "PROTIME" in the last 168  hours. Cardiac Enzymes: No results for input(s): "CKTOTAL", "CKMB", "CKMBINDEX", "TROPONINI" in the last 168 hours. BNP (last 3 results) No results for input(s): "PROBNP" in the last 8760 hours. HbA1C: No results for input(s): "HGBA1C" in the last 72 hours. CBG: Recent Labs  Lab 02/08/22 0738 02/08/22 0939 02/08/22 1013 02/09/22 0507  GLUCAP 70 62* 110* 72    Lipid Profile: No results for input(s): "CHOL", "HDL", "LDLCALC", "TRIG", "CHOLHDL", "LDLDIRECT" in the last 72 hours. Thyroid Function Tests: No results for input(s): "TSH", "T4TOTAL", "FREET4", "T3FREE", "THYROIDAB" in the last 72 hours. Anemia Panel: No results for input(s): "VITAMINB12", "FOLATE", "FERRITIN", "TIBC", "IRON", "RETICCTPCT" in the last 72 hours. Urine analysis: No results found for: "COLORURINE", "APPEARANCEUR", "LABSPEC", "PHURINE", "GLUCOSEU", "HGBUR", "BILIRUBINUR", "KETONESUR", "PROTEINUR", "UROBILINOGEN", "NITRITE", "LEUKOCYTESUR" Sepsis Labs: @LABRCNTIP (procalcitonin:4,lacticidven:4)  No results found for this or any previous visit (from the past 240 hour(s)).       Radiology Studies: CT HEAD WO CONTRAST (5MM)  Result Date: 12/15/2021 CLINICAL DATA:  Altered mental status EXAM: CT HEAD WITHOUT CONTRAST TECHNIQUE: Contiguous axial images were obtained from the base of the skull through the vertex without intravenous contrast. RADIATION DOSE REDUCTION: This exam was performed according to the departmental dose-optimization program which includes automated exposure control, adjustment of the mA and/or kV according to patient size and/or use of iterative reconstruction technique. COMPARISON:  None Available. FINDINGS: Brain: No evidence of acute cortical infarction, hemorrhage, cerebral edema, mass, mass effect, or midline shift. No hydrocephalus or extra-axial fluid collection. 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 Periventricular white matter changes, likely the  sequela of chronic small vessel ischemic disease. Vascular: No hyperdense vessel. Skull: Normal. Negative for fracture or focal lesion. Sinuses/Orbits: No acute finding. Other: The mastoid air cells are well aerated. IMPRESSION: No acute intracranial process. Electronically Signed   By: Merilyn Baba M.D.   On: 12/15/2021 14:22   DG Chest Portable 1 View  Result Date: 12/15/2021 CLINICAL DATA:  Altered mental status. EXAM: PORTABLE CHEST 1 VIEW COMPARISON:  None Available. FINDINGS: Mild cardiomegaly is noted. Right internal jugular dialysis catheter is noted with distal tip in expected position of cavoatrial junction. Both lungs are clear. The visualized skeletal structures are unremarkable. IMPRESSION: No active disease. Electronically Signed   By: Marijo Conception M.D.   On: 12/15/2021 14:18  LOS: 54 days       Emeterio Reeve, DO [facility] 02/09/2022, 8:24 AM   Staff may message me via secure chat in [place]  but this may not receive immediate response,  please page for urgent matters!  If 7PM-7AM, please contact night-coverage [email or web address]  Dictation software was used to generate the above note. Typos may occur and escape review, as with typed/written notes. Please contact Dr Sheppard Coil directly for clarity if needed.

## 2022-02-09 NOTE — Plan of Care (Signed)
  Problem: Education: Goal: Knowledge of General Education information will improve Description: Including pain rating scale, medication(s)/side effects and non-pharmacologic comfort measures Outcome: Progressing   Problem: Safety: Goal: Ability to remain free from injury will improve Outcome: Progressing   Problem: Skin Integrity: Goal: Risk for impaired skin integrity will decrease Outcome: Progressing   

## 2022-02-09 NOTE — TOC Progression Note (Addendum)
Transition of Care [facility]) - Progression Note    Patient Details  Name: Carolos Brown MRN: [id number] Date of Birth: Feb 20, 1961  Transition of Care [facility]) CM/SW [place], [place] Phone Number: 02/09/2022, 2:59 PM  Clinical Narrative:     CSW lvm with Debbie with [facility] ([place]) and have texted [facility] to inquire as to referral as well. Pending response regarding long term medicaid bed.   CSW spoke with [name] at [facility] where patient was originally living, he reports they still have his bed available and ARE able to provide 24/7 supervision, they said they will come look at him tomorrow or Wednesday to complete assessment.   CSW has informed PT to maximize patient's time here prior to assessment done by [facility].    Expected Discharge Plan: [place] Barriers to Discharge: Continued Medical Work up  Expected Discharge Plan and Services Expected Discharge Plan: [place] arrangements for the past 2 months: [place] Expected Discharge Date: 12/19/21                                     Social Determinants of Health (SDOH) Interventions    Readmission Risk Interventions     No data to display

## 2022-02-09 NOTE — Progress Notes (Signed)
[facility]  PROGRESS NOTE   Subjective:   Patient seen and evaluated during dialysis   HEMODIALYSIS FLOWSHEET:  Blood Flow Rate (mL/min): 400 mL/min Arterial Pressure (mmHg): -150 mmHg Venous Pressure (mmHg): 250 mmHg TMP (mmHg): 8 mmHg Ultrafiltration Rate (mL/min): 1114 mL/min Dialysate Flow Rate (mL/min): 300 ml/min Dialysis Fluid Bolus: Normal Saline Bolus Amount (mL): 300 mL  Tolerating treatment well.    Objective:  Vital signs: Blood pressure (!) 161/81, pulse 60, temperature 98.5 F (36.9 C), temperature source Oral, resp. rate 11, height 6' (1.829 m), weight 93.4 kg, SpO2 95 %.  Intake/Output Summary (Last 24 hours) at 02/09/2022 1018 Last data filed at 02/09/2022 0630 Gross per 24 hour  Intake 703 ml  Output --  Net 703 ml    Filed Weights   02/07/22 0453 02/08/22 0500 02/09/22 0749  Weight: 90.6 kg 90.6 kg 93.4 kg     Physical Exam: General:  No acute distress  Head:  Normocephalic, atraumatic. Moist oral mucosal membranes  Eyes:  Anicteric  Lungs:   Clear to auscultation, normal effort  Heart:  S1S2 no rubs  Abdomen:   Soft, nontender, bowel sounds present  Extremities:  2+ peripheral edema.  Neurologic:  Awake, alert, following commands  Skin:  No lesions  Access: Lt AVF     Basic Metabolic Panel: Recent Labs  Lab 02/02/22 1249 02/04/22 0822 02/05/22 0511 02/08/22 0454  NA 130* 128* 132* 133*  K 6.2* 5.0 4.6 4.7  CL 90* 90* 93* 96*  CO2 26 25 27 27   GLUCOSE 81 109* 80 69*  BUN 108* 94* 69* 70*  CREATININE 7.03* 6.29* 5.48* 5.50*  CALCIUM 9.2 9.1 8.6* 9.1  PHOS 6.4* 6.5*  --   --      CBC: Recent Labs  Lab 02/02/22 1249 02/04/22 0822  WBC 4.4 3.8*  NEUTROABS  --  2.5  HGB 10.2* 10.9*  HCT 30.9* 31.6*  MCV 102.3* 100.3*  PLT 165 167      Urinalysis: No results for input(s): "COLORURINE", "LABSPEC", "PHURINE", "GLUCOSEU", "HGBUR", "BILIRUBINUR", "KETONESUR", "PROTEINUR", "UROBILINOGEN", "NITRITE",  "LEUKOCYTESUR" in the last 72 hours.  Invalid input(s): "APPERANCEUR"    Imaging: No results found.   Medications:    anticoagulant sodium citrate      (feeding supplement) PROSource Plus  30 mL Oral TID BM   amLODipine  10 mg Oral q morning   calcium acetate  1,334 mg Oral TID WC   carbamazepine  1,000 mg Oral q AM   Chlorhexidine Gluconate Cloth  6 each Topical Daily   cloNIDine  0.2 mg Transdermal Weekly   dextrose  1 Tube Oral Once   doxazosin  4 mg Oral QHS   epoetin (EPOGEN/PROCRIT) injection  4,000 Units Intravenous Q M,W,F-HD   ferrous sulfate  325 mg Oral Q breakfast   fluPHENAZine  2.5 mg Oral q AM   heparin  5,000 Units Subcutaneous Q8H   hydrALAZINE  25 mg Oral TID   irbesartan  300 mg Oral Daily   isosorbide mononitrate  60 mg Oral BID   minoxidil  5 mg Oral Daily   mometasone-formoterol  2 puff Inhalation BID   multivitamin  1 tablet Oral QHS   mouth rinse  15 mL Mouth Rinse 4 times per day   pantoprazole  40 mg Oral Daily   patiromer  16.8 g Oral Daily   polyethylene glycol  17 g Oral BID   trihexyphenidyl  2 mg Oral BID WC    Assessment/  Plan:     Active Problems:   Essential hypertension   ESRD on dialysis [facility])   Mental health disorder   Physical deconditioning   Oral thrush   Malnutrition of moderate degree   Hyperkalemia   Anemia of chronic disease  61 year old male with history of hypertension, anemia, schizophrenia, end-stage renal disease on hemodialysis on Monday Wednesday Friday schedule now admitted with history of mental status changes and accelerated hypertension.  [facility] [place]/MWF/Lt AVF   #1: End-stage renal disease on hemodialysis. Receiving dialysis today, UF goal 3L as tolerated. Next treatment scheduled for Wednesday.  Patient will need outpatient clinic arranged prior to discharge.    #2: Anemia: Hemoglobin 10.9, will continue to monitor.  Continue low-dose EPO.   #3: Secondary parathyroidism: Calcium remains within  desired target.  Patient currently receiving calcium acetate with meals.   #4: Hypertension: Patient has poorly controlled hypertension he has been on minoxidil, isosorbide, irbesartan, amlodipine, clonidine, hydralazine and doxazosin.  Blood pressure 168/86 during dialysis.   LOS: [facility] 9/11/202310:18 AM

## 2022-02-09 NOTE — Progress Notes (Signed)
Physical Therapy Treatment Patient Details Name: Todd Brown MRN: [id number] DOB: 10/20/1960 Today's Date: 02/09/2022   History of Present Illness 61 year old male with past medical history of hypertension and end-stage renal disease on hemodialysis presented to the emergency room from his group home/skilled nursing facility on 7/17 with confusion.  (Patient's baseline is that he is unable to care for himself and has a legal guardian.)  Patient last received dialysis on Friday, 7/14.  From being in the emergency room on 7/17, unable to get dialysis yesterday.  Patient found to have markedly elevated blood pressure with a systolic in the 967E.  Patient with questionable cardiac ischemia as well.    PT Comments    Patient was cooperative with session. He was agreeable in bed exercises for LE strengthening which required cues and physical assistance to complete. No pain reported and no shortness of breath noted. He was initially agreeable to get up to the chair but then declined mobility. Recommend routine OOB to chair with staff assistance for upright conditioning as patient is willing to mobilize. PT will continue to follow to maximize independence and decrease caregiver burden.     Recommendations for follow up therapy are one component of a multi-disciplinary discharge planning process, led by the attending physician.  Recommendations may be updated based on patient status, additional functional criteria and insurance authorization.  Follow Up Recommendations  Skilled nursing-short term rehab (<3 hours/day) Can patient physically be transported by private vehicle: No   Assistance Recommended at Discharge Frequent or constant Supervision/Assistance  Patient can return home with the following Two people to help with walking and/or transfers;Help with stairs or ramp for entrance;Assist for transportation;Assistance with cooking/housework;A lot of help with bathing/dressing/bathroom   Equipment  Recommendations  Rolling walker (2 wheels);BSC/3in1;Wheelchair cushion (measurements PT);Wheelchair (measurements PT)    Recommendations for Other Services       Precautions / Restrictions Precautions Precautions: Fall Restrictions Weight Bearing Restrictions: No     Mobility  Bed Mobility               General bed mobility comments: patient initially wanted to get out of bed and then declined. he was agreeable to in bed exercises for LE strengthening    Transfers                        Ambulation/Gait                   Stairs             Wheelchair Mobility    Modified Rankin (Stroke Patients Only)       Balance                                            Cognition Arousal/Alertness: Awake/alert Behavior During Therapy: Flat affect Overall Cognitive Status: No family/caregiver present to determine baseline cognitive functioning                       Memory: Decreased short-term memory Following Commands: Follows one step commands with increased time Safety/Judgement: Decreased awareness of deficits, Decreased awareness of safety Awareness: Intellectual Problem Solving: Slow processing, Decreased initiation, Difficulty sequencing, Requires verbal cues, Requires tactile cues          Exercises General Exercises - Lower Extremity Ankle Circles/Pumps: Strengthening, Both, 10 reps, Supine, AAROM  Short Arc Quad: AAROM, Strengthening, Both, 10 reps, Supine Heel Slides: AAROM, Strengthening, Both, 10 reps, Supine Hip ABduction/ADduction: AAROM, Strengthening, Both, 10 reps, Supine Other Exercises Other Exercises: verbal and tactile cues for exercise technique. physical assistance required with all exercises    General Comments        Pertinent Vitals/Pain Pain Assessment Pain Assessment: No/denies pain    Home Living                          Prior Function            PT Goals  (current goals can now be found in the care plan section) Acute Rehab PT Goals Patient Stated Goal: none stated PT Goal Formulation: Patient unable to participate in goal setting Time For Goal Achievement: 02/21/22 Potential to Achieve Goals: Fair Progress towards PT goals: Progressing toward goals    Frequency    Min 2X/week      PT Plan Current plan remains appropriate    Co-evaluation              AM-PAC PT "6 Clicks" Mobility   Outcome Measure  Help needed turning from your back to your side while in a flat bed without using bedrails?: None Help needed moving from lying on your back to sitting on the side of a flat bed without using bedrails?: A Little Help needed moving to and from a bed to a chair (including a wheelchair)?: A Little Help needed standing up from a chair using your arms (e.g., wheelchair or bedside chair)?: A Little Help needed to walk in hospital room?: A Lot Help needed climbing 3-5 steps with a railing? : Total 6 Click Score: 16    End of Session Equipment Utilized During Treatment: Oxygen Activity Tolerance: Patient tolerated treatment well Patient left: in bed;with call bell/phone within reach;with bed alarm set Nurse Communication: Mobility status PT Visit Diagnosis: Other abnormalities of gait and mobility (R26.89);Muscle weakness (generalized) (M62.81);Unsteadiness on feet (R26.81)     Time: 8003-4917 PT Time Calculation (min) (ACUTE ONLY): 9 min  Charges:  $Therapeutic Exercise: 8-22 mins                     [name], PT, MPT    [name] 02/09/2022, 2:35 PM

## 2022-02-09 NOTE — Progress Notes (Signed)
OT Cancellation Note  Patient Details Name: Todd Brown MRN: [id number] DOB: 04/08/1961   Cancelled Treatment:    Reason Eval/Treat Not Completed: Patient declined, no reason specified. Pt asleep in bed upon arrival, however, easily woke to voice. Pt declining to complete UE exercises or toileting, stating "I don't want therapy." When stating OT could come back tomorrow, pt stating "don't come back at all." Will re-attempt at next available service date.  Doneta Public 02/09/2022, 4:25 PM

## 2022-02-10 ENCOUNTER — Inpatient Hospital Stay: Payer: Medicaid Other

## 2022-02-10 DIAGNOSIS — G9341 Metabolic encephalopathy: Secondary | ICD-10-CM | POA: Diagnosis not present

## 2022-02-10 DIAGNOSIS — E871 Hypo-osmolality and hyponatremia: Secondary | ICD-10-CM

## 2022-02-10 LAB — CBC
HCT: 31.7 % — ABNORMAL LOW (ref 39.0–52.0)
Hemoglobin: 10.8 g/dL — ABNORMAL LOW (ref 13.0–17.0)
MCH: 34.4 pg — ABNORMAL HIGH (ref 26.0–34.0)
MCHC: 34.1 g/dL (ref 30.0–36.0)
MCV: 101 fL — ABNORMAL HIGH (ref 80.0–100.0)
Platelets: 152 10*3/uL (ref 150–400)
RBC: 3.14 MIL/uL — ABNORMAL LOW (ref 4.22–5.81)
RDW: 14.1 % (ref 11.5–15.5)
WBC: 3.7 10*3/uL — ABNORMAL LOW (ref 4.0–10.5)
nRBC: 0 % (ref 0.0–0.2)

## 2022-02-10 LAB — BASIC METABOLIC PANEL
Anion gap: 11 (ref 5–15)
BUN: 72 mg/dL — ABNORMAL HIGH (ref 8–23)
CO2: 26 mmol/L (ref 22–32)
Calcium: 8.7 mg/dL — ABNORMAL LOW (ref 8.9–10.3)
Chloride: 96 mmol/L — ABNORMAL LOW (ref 98–111)
Creatinine, Ser: 5.72 mg/dL — ABNORMAL HIGH (ref 0.61–1.24)
GFR, Estimated: 11 mL/min — ABNORMAL LOW (ref >60)
Glucose, Bld: 77 mg/dL (ref 70–99)
Potassium: 5 mmol/L (ref 3.5–5.1)
Sodium: 133 mmol/L — ABNORMAL LOW (ref 135–145)

## 2022-02-10 LAB — HEPATITIS B SURFACE ANTIGEN: Hepatitis B Surface Ag: NONREACTIVE

## 2022-02-10 NOTE — Progress Notes (Signed)
   02/05/22 0808 02/05/22 0830 02/05/22 0900  Vitals  BP 138/72 (!) 163/95 138/72  MAP (mmHg) 91 116 92  BP Location Right Arm Right Arm Right Arm  BP Method Automatic Automatic Automatic  Patient Position (if appropriate) Lying Lying Lying  Pulse Rate 62 74 80  Pulse Rate Source Monitor Monitor Monitor  ECG Heart Rate 63 74 80  Resp (!) 9 14 17   During Treatment Monitoring  Blood Flow Rate (mL/min) 400 mL/min 400 mL/min 400 mL/min  Arterial Pressure (mmHg) -130 mmHg -140 mmHg -150 mmHg  Venous Pressure (mmHg) 240 mmHg 250 mmHg 130 mmHg  TMP (mmHg) -19 mmHg -19 mmHg 0 mmHg  Ultrafiltration Rate (mL/min) 1433 mL/min 1433 mL/min 1433 mL/min  Dialysate Flow Rate (mL/min) 300 ml/min 300 ml/min 300 ml/min  HD Safety Checks Performed Yes Yes Yes  Intra-Hemodialysis Comments Tx initiated Progressing as prescribed Progressing as prescribed    02/05/22 0930 02/05/22 0943 02/05/22 1000  Vitals  BP  --   --  116/72  MAP (mmHg)  --   --  85  BP Location Right Arm Right Arm Right Arm  BP Method Automatic Automatic Automatic  Patient Position (if appropriate) Lying Lying Lying  Pulse Rate  --   --  65  Pulse Rate Source Monitor Monitor Monitor  ECG Heart Rate  --   --  65  Resp  --   --  11  During Treatment Monitoring  Blood Flow Rate (mL/min) 400 mL/min  --  400 mL/min  Arterial Pressure (mmHg) 0 mmHg  --  -170 mmHg  Venous Pressure (mmHg) -180 mmHg  --  260 mmHg  TMP (mmHg)  --   --  6 mmHg  Ultrafiltration Rate (mL/min) 1482 mL/min  --  1300 mL/min  Dialysate Flow Rate (mL/min) 300 ml/min  --  300 ml/min  HD Safety Checks Performed Yes  --  Yes  Intra-Hemodialysis Comments See progress note (Machine issues, see notes, tx is not registering on machine) See progress note (Tx paused, pt alert, no c/o, vss.) See progress note (Tx resumed at 10:27)

## 2022-02-10 NOTE — Progress Notes (Signed)
   02/06/22 0819 02/06/22 0830 02/06/22 0900  Vitals  BP  --  (!) 149/74 (!) 150/73  MAP (mmHg)  --  96 95  BP Location Right Arm Right Arm Right Arm  BP Method Automatic Automatic Automatic  Patient Position (if appropriate) Lying Lying Lying  Pulse Rate (!) 55 (!) 53 (!) 52  Pulse Rate Source  --  Monitor Monitor  ECG Heart Rate (!) 55 (!) 55 (!) 53  Resp 11 12 12   During Treatment Monitoring  Blood Flow Rate (mL/min) 400 mL/min 400 mL/min 400 mL/min  Arterial Pressure (mmHg) -140 mmHg -150 mmHg -180 mmHg  Venous Pressure (mmHg) 240 mmHg 240 mmHg 240 mmHg  TMP (mmHg) 8 mmHg 9 mmHg 11 mmHg  Ultrafiltration Rate (mL/min) 1114 mL/min 1114 mL/min 1114 mL/min  Dialysate Flow Rate (mL/min) 300 ml/min 300 ml/min 300 ml/min  HD Safety Checks Performed Yes Yes Yes  Intra-Hemodialysis Comments Tx initiated Progressing as prescribed Progressing as prescribed  Dialysis Fluid Bolus Normal Saline  --   --   Bolus Amount (mL) 300 mL  --   --   Dialysate Change 2K;2.5 Ca  --   --     02/06/22 0930 02/06/22 1000 02/06/22 1030  Vitals  BP (!) 173/84 (!) 146/72 (!) 149/71  MAP (mmHg) 109 92 92  BP Location Right Arm Right Arm Right Arm  BP Method Automatic Automatic Automatic  Patient Position (if appropriate) Lying Lying Lying  Pulse Rate  --  (!) 59 61  Pulse Rate Source Monitor Monitor Monitor  ECG Heart Rate  --  61 61  Resp  --  18 16  During Treatment Monitoring  Blood Flow Rate (mL/min) 400 mL/min 400 mL/min 400 mL/min  Arterial Pressure (mmHg) -180 mmHg -210 mmHg -170 mmHg  Venous Pressure (mmHg) 260 mmHg 240 mmHg 240 mmHg  TMP (mmHg) 10 mmHg 9 mmHg 8 mmHg  Ultrafiltration Rate (mL/min) 1114 mL/min 1114 mL/min 1114 mL/min  Dialysate Flow Rate (mL/min) 300 ml/min 300 ml/min 300 ml/min  HD Safety Checks Performed Yes Yes Yes  Intra-Hemodialysis Comments Progressing as prescribed (Pt resting, no c/o, vss, ufr 910) Progressing as prescribed Progressing as prescribed  Dialysis Fluid  Bolus  --   --   --   Bolus Amount (mL)  --   --   --   Dialysate Change  --   --   --

## 2022-02-10 NOTE — Progress Notes (Addendum)
[facility]  PROGRESS NOTE   Subjective:   Patient seen sitting in the chair Denies pain and discomfort Receiving assistance with breakfast   Objective:  Vital signs: Blood pressure (!) 153/85, pulse 68, temperature 98.5 F (36.9 C), resp. rate 17, height 6' (1.829 m), weight 95.2 kg, SpO2 92 %. No intake or output data in the 24 hours ending 02/10/22 1404  Filed Weights   02/09/22 0749 02/09/22 1143 02/10/22 0452  Weight: 93.4 kg 90.7 kg 95.2 kg     Physical Exam: General:  No acute distress  Head:  Normocephalic, atraumatic. Moist oral mucosal membranes  Eyes:  Anicteric  Lungs:   Clear to auscultation, normal effort  Heart:  S1S2 no rubs  Abdomen:   Soft, nontender, bowel sounds present  Extremities:  2+ peripheral edema.  Neurologic:  Awake, alert, following commands  Skin:  No lesions  Access: Lt AVF     Basic Metabolic Panel: Recent Labs  Lab 02/04/22 0822 02/05/22 0511 02/08/22 0454 02/10/22 0613  NA 128* 132* 133* 133*  K 5.0 4.6 4.7 5.0  CL 90* 93* 96* 96*  CO2 25 27 27 26   GLUCOSE 109* 80 69* 77  BUN 94* 69* 70* 72*  CREATININE 6.29* 5.48* 5.50* 5.72*  CALCIUM 9.1 8.6* 9.1 8.7*  PHOS 6.5*  --   --   --      CBC: Recent Labs  Lab 02/04/22 0822 02/10/22 0613  WBC 3.8* 3.7*  NEUTROABS 2.5  --   HGB 10.9* 10.8*  HCT 31.6* 31.7*  MCV 100.3* 101.0*  PLT 167 152      Urinalysis: No results for input(s): "COLORURINE", "LABSPEC", "PHURINE", "GLUCOSEU", "HGBUR", "BILIRUBINUR", "KETONESUR", "PROTEINUR", "UROBILINOGEN", "NITRITE", "LEUKOCYTESUR" in the last 72 hours.  Invalid input(s): "APPERANCEUR"    Imaging: DG Chest 1 View  Result Date: 02/10/2022 CLINICAL DATA:  End-stage renal disease. EXAM: CHEST  1 VIEW COMPARISON:  12/17/2021 FINDINGS: Stable moderate cardiac enlargement. Stable positioning and appearance right-sided tunneled hemodialysis catheter with catheter tip projecting at the SVC/RA junction. Lungs demonstrate  chronic component of pulmonary venous hypertension and probable mild chronic pulmonary interstitial edema without overt airspace edema. No airspace consolidation or pneumothorax. Trace left pleural fluid. IMPRESSION: Chronic pulmonary venous hypertension/pulmonary interstitial edema. Trace left pleural fluid. Electronically Signed   By: Aletta Edouard M.D.   On: 02/10/2022 12:16     Medications:    anticoagulant sodium citrate      (feeding supplement) PROSource Plus  30 mL Oral TID BM   amLODipine  10 mg Oral q morning   calcium acetate  1,334 mg Oral TID WC   carbamazepine  1,000 mg Oral q AM   Chlorhexidine Gluconate Cloth  6 each Topical Daily   cloNIDine  0.2 mg Transdermal Weekly   dextrose  1 Tube Oral Once   doxazosin  4 mg Oral QHS   epoetin (EPOGEN/PROCRIT) injection  4,000 Units Intravenous Q M,W,F-HD   ferrous sulfate  325 mg Oral Q breakfast   fluPHENAZine  2.5 mg Oral q AM   heparin  5,000 Units Subcutaneous Q8H   hydrALAZINE  25 mg Oral TID   irbesartan  300 mg Oral Daily   isosorbide mononitrate  60 mg Oral BID   minoxidil  5 mg Oral Daily   mometasone-formoterol  2 puff Inhalation BID   multivitamin  1 tablet Oral QHS   mouth rinse  15 mL Mouth Rinse 4 times per day   pantoprazole  40 mg Oral  Daily   patiromer  16.8 g Oral Daily   polyethylene glycol  17 g Oral BID   trihexyphenidyl  2 mg Oral BID WC    Assessment/ Plan:     Active Problems:   Essential hypertension   ESRD on dialysis [facility])   Mental health disorder   Physical deconditioning   Oral thrush   Malnutrition of moderate degree   Hyperkalemia   Anemia of chronic disease   Hyponatremia  61 year old male with history of hypertension, anemia, schizophrenia, end-stage renal disease on hemodialysis on Monday Wednesday Friday schedule now admitted with history of mental status changes and accelerated hypertension.  [facility] [place]/MWF/Lt AVF   #1: End-stage renal disease on hemodialysis.    Dialysis received yesterday, UF 3L achieved. Next treatment scheduled for Wednesday.   Hepatitis B antigen and chest xray ordered for outpatient placement   #2: Anemia: Hemoglobin remains stable. Continue low-dose EPO.   #3: Secondary parathyroidism: Patient currently receiving calcium acetate with meals.   #4: Hypertension: Patient has poorly controlled hypertension he has been on minoxidil, isosorbide, irbesartan, amlodipine, clonidine, hydralazine and doxazosin.  Blood pressure stable   LOS: [address] [facility] 9/12/20232:04 PM

## 2022-02-10 NOTE — Progress Notes (Addendum)
Occupational Therapy Treatment Patient Details Name: Todd Brown MRN: [id number] DOB: 11/01/1960 Today's Date: 02/10/2022   History of present illness 61 year old male with past medical history of hypertension and end-stage renal disease on hemodialysis presented to the emergency room from his group home/skilled nursing facility on 7/17 with confusion.  (Patient's baseline is that he is unable to care for himself and has a legal guardian.)  Patient last received dialysis on Friday, 7/14.  From being in the emergency room on 7/17, unable to get dialysis yesterday.  Patient found to have markedly elevated blood pressure with a systolic in the 382N.  Patient with questionable cardiac ischemia as well.   OT comments  Upon entering session, pt sitting in recliner and agreeable to OT. Pt completed seated grooming tasks with set up A to supervision, self-feeding with set up A, and STS from recliner with Min A + RW. Pt required multimodal cues throughout session this date and required increased time/repetition to follow one step commands. Pt was instructed in BUE AROM exercises. Pt was motivated to participate in therapy. Pt is making progress toward goal completion. Anticipate pt could return to ALF with assistance from staff and [place], however, if unable to then SNF would recommended. OT will continue to follow acutely.    Recommendations for follow up therapy are one component of a multi-disciplinary discharge planning process, led by the attending physician.  Recommendations may be updated based on patient status, additional functional criteria and insurance authorization.    Follow Up Recommendations  Skilled nursing-short term rehab (<3 hours/day)    Assistance Recommended at Discharge Frequent or constant Supervision/Assistance  Patient can return home with the following  A lot of help with bathing/dressing/bathroom;A lot of help with walking and/or transfers;Assistance with feeding;Assist for  transportation;Direct supervision/assist for medications management;Direct supervision/assist for financial management;Assistance with cooking/housework;Help with stairs or ramp for entrance   Equipment Recommendations  Other (comment) (defer to next venue of care)    Recommendations for Other Services      Precautions / Restrictions Precautions Precautions: Fall Precaution Comments: vision impaired Restrictions Weight Bearing Restrictions: No       Mobility Bed Mobility               General bed mobility comments: pt received and left in recliner    Transfers Overall transfer level: Needs assistance Equipment used: Rolling walker (2 wheels) Transfers: Sit to/from Stand Sit to Stand: Min assist           General transfer comment: STS from recliner with Min A, pt required increased time and verbal cues to initiate     Balance Overall balance assessment: Needs assistance Sitting-balance support: Feet supported Sitting balance-[name]: Good     Standing balance support: Bilateral upper extremity supported Standing balance-[name]: Fair Standing balance comment: with RW for UE support, able to march in place while maintaining dynamic standing balance with Min guard                           ADL either performed or assessed with clinical judgement   ADL Overall ADL's : Needs assistance/impaired Eating/Feeding: Set up;Sitting;Supervision/ safety   Grooming: Sitting;Set up;Wash/dry face;Supervision/safety                                      Extremity/Trunk Assessment Upper Extremity Assessment Upper Extremity Assessment: Generalized weakness  Lower Extremity Assessment Lower Extremity Assessment: Generalized weakness        Vision Baseline Vision/History: 2 Legally blind Patient Visual Report: No change from baseline     Perception     Praxis      Cognition Arousal/Alertness: Awake/alert Behavior During  Therapy: Flat affect Overall Cognitive Status: No family/caregiver present to determine baseline cognitive functioning Area of Impairment: Orientation, Attention, Memory, Following commands, Safety/judgement, Awareness, Problem solving                 Orientation Level: Disoriented to, Time Current Attention Level: Sustained Memory: Decreased short-term memory Following Commands: Follows one step commands with increased time Safety/Judgement: Decreased awareness of deficits, Decreased awareness of safety Awareness: Intellectual Problem Solving: Slow processing, Decreased initiation, Difficulty sequencing, Requires verbal cues, Requires tactile cues General Comments: Pt is oriented to person and place, cooperative throughout session        Exercises Other Exercises Other Exercises: BUE AROM exercises in sitting (x10 each): shoulder flexion, seated punches, elbow flexion/extension. Multimodal cues and physical assistance required for exercise technique    Shoulder Instructions       General Comments No c/o dizziness, SpO2 >90% with activity. OT educating pt re: HEP, functional cognition, safe transfer techniques    Pertinent Vitals/ Pain       Pain Assessment Pain Assessment: No/denies pain  Home Living                                          Prior Functioning/Environment              Frequency  Min 2X/week        Progress Toward Goals  OT Goals(current goals can now be found in the care plan section)  Progress towards OT goals: Progressing toward goals  Acute Rehab OT Goals Patient Stated Goal: get better OT Goal Formulation: With patient Time For Goal Achievement: 02/18/22 Potential to Achieve Goals: [place] Discharge plan remains appropriate;Frequency remains appropriate    Co-evaluation                 AM-PAC OT "6 Clicks" Daily Activity     Outcome Measure   Help from another person eating meals?: A Little Help  from another person taking care of personal grooming?: A Little Help from another person toileting, which includes using toliet, bedpan, or urinal?: A Lot Help from another person bathing (including washing, rinsing, drying)?: A Lot Help from another person to put on and taking off regular upper body clothing?: A Little Help from another person to put on and taking off regular lower body clothing?: A Little 6 Click Score: 16    End of Session Equipment Utilized During Treatment: Oxygen;Gait belt;Rolling walker (2 wheels)  OT Visit Diagnosis: Unsteadiness on feet (R26.81);Muscle weakness (generalized) (M62.81);Feeding difficulties (R63.3)   Activity Tolerance Patient tolerated treatment well   Patient Left in chair;with call bell/phone within reach;with chair alarm set   Nurse Communication Mobility status        Time: 0254-2706 OT Time Calculation (min): 19 min  Charges: OT General Charges $OT Visit: 1 Visit OT Treatments $Self Care/Home Management : 8-22 mins   [facility] MS, OTR/L ascom [phone]  02/10/22, 11:24 AM

## 2022-02-10 NOTE — Progress Notes (Signed)
   02/06/22 1100 02/06/22 1130 02/06/22 1147  Vitals  Temp  --   --  97.9 F (36.6 C)  Temp Source  --   --  Axillary  BP 139/72 (!) 146/68 (!) 152/74  MAP (mmHg) 93 90 93  BP Location Right Arm Right Arm Right Arm  BP Method Automatic Automatic Automatic  Patient Position (if appropriate) Lying Lying Lying  Pulse Rate 61 60 (!) 59  Pulse Rate Source Monitor Monitor Monitor  ECG Heart Rate 60 60 (!) 59  Resp (!) 21 14 11   During Treatment Monitoring  Blood Flow Rate (mL/min) 400 mL/min 400 mL/min  --   Arterial Pressure (mmHg) -150 mmHg -170 mmHg  --   Venous Pressure (mmHg) 250 mmHg 250 mmHg  --   TMP (mmHg) 9 mmHg 7 mmHg  --   Ultrafiltration Rate (mL/min) 1114 mL/min 1114 mL/min  --   Dialysate Flow Rate (mL/min) 300 ml/min 300 ml/min  --   HD Safety Checks Performed Yes Yes Yes  Intra-Hemodialysis Comments Progressing as prescribed Progressing as prescribed Tolerated well;Tx completed

## 2022-02-10 NOTE — Progress Notes (Signed)
PROGRESS NOTE    Todd Brown   LAG:[id number] DOB: March 19, 1961  DOA: 12/15/2021 Date of Service: 02/10/22 PCP: Jodi Marble, MD     Brief Narrative / Hospital Course:  61 year old male with past medical history of hypertension and end-stage renal disease on hemodialysis presented to the emergency room from his group home/Assisted living facility on 7/17 with confusion.  (Patient's baseline was that he is unable to care for himself and has a legal guardian.)  Patient last received dialysis on Friday, 7/14.     Patient found to have markedly elevated blood pressure with a systolic in the 122Q requiring Cardene drip and placement of patient to the ICU.  Patient with questionable cardiac ischemia as well.  CT scan of head and neurological exam unremarkable.     Patient was admitted to the hospitalist service and started on his oral antihypertensives, nicardipine drip and given IV labetalol and as needed IV hydralazine.  Nephrology consulted and resumed patient's dialysis.  Over the next few weeks, blood pressure better controlled.     At times became agitated and seen by psychiatry and started on scheduled and as needed Haldol, leading to more compliance with better mentation as well as taking his antihypertensives.     Hospital course complicated by episode of periorbital swelling bilaterally felt to be secondary to subcutaneous emphysema, resolved on steroids.  Also had episode of oral thrush treated with Magic mouthwash.     Patient's assisted living facility willing to take patient back after becomes much stronger at skilled nursing facility.  Skilled nursing facility search still in progress, apparently with no bed offers thus far.   Consultants: Nephrology Critical care Psychiatry   Procedures: Hemodialysis      ASSESSMENT & PLAN:   Active Problems:   Hyperkalemia   ESRD on dialysis [facility])   Mental health disorder   Essential hypertension   Oral thrush   Physical  deconditioning   Malnutrition of moderate degree   Anemia of chronic disease  Hyperkalemia -resolved Continue to follow periodic BMP Continuing dialysis    Hypertensive emergency-resolved as of 12/31/2021 Resolved.  See below.   ESRD on dialysis [facility]) Nephrology consulted Continue scheduled dialysis   Mental health disorder -stable Unspecified.  It is unclear if he is ever had a formal diagnosis.   Patient is unable to care for himself.   He is for the most part much more calm and cooperative He is on Tegretol and Artane which he should continue.   As needed Haldol for acute behavior agitation   Placement pending, today 02/10/22 is hospital day 57   Essential hypertension -stable Resumed on his home p.o. amlodipine 10 mg daily, clonidine 0.3 mg p.o. 3 times daily, hydralazine 100 mg p.o. 3 times daily, isosorbide mononitrate 60 mg p.o. twice daily, losartan 100 mg daily.   Nephrology added clonidine patches with p.o. clonidine. Minoxidil was then added and titrated up to 10 mg daily.   Oral thrush -resolved 8/23: start nystatin suspension -completed course   COPD exacerbation ([place]) - resolved as of 12/18/2021 Monitor O2 w/ VS   Physical deconditioning PT OT are recommending SNF.  However, this is similar to a previous hospitalization where TOC was unable to find skilled nursing and over time, patient's strength improved that he was able to be discharged back to his assisted living facility. PT/OT to follow however assistd living is refusing to take him back until stint in SNF TOC following    Anemia of chronic disease  Hbg stable. Check CBC ~weekly.   Malnutrition of moderate degree Nutrition Status: Nutrition Problem: Moderate Malnutrition Etiology: chronic illness (ESRD on HD) Signs/Symptoms: mild fat depletion, mild muscle depletion, moderate muscle depletion Interventions: Refer to RD note for recommendations, Nepro shake, MVI, Prostat Body mass index is 27 kg/m.   Nutrition Problem: Moderate Malnutrition Etiology: chronic illness (ESRD on HD)    Hypoglycemia - resolved Poor po intake Treated without complication Monitor    DVT prophylaxis: heparin Pertinent IV fluids/nutrition: n/a Central lines / invasive devices: n/a  Code Status: FULL Family Communication: none at this time -TOC was having a family meeting to discuss placement concerns  Disposition: inpatient TOC needs: pending SNF Barriers to discharge / significant pending items: placement issue             Subjective:  Patient has no complaints today.  No concerns from RN.        Objective:  Vitals:   02/09/22 1254 02/10/22 0446 02/10/22 0452 02/10/22 0846  BP: (!) 152/73 119/76  (!) 153/85  Pulse: 63 64  68  Resp: 18 16  17   Temp: 98.5 F (36.9 C) 98.6 F (37 C)  98.5 F (36.9 C)  TempSrc:      SpO2: 98% 100%  92%  Weight:   95.2 kg   Height:       No intake or output data in the 24 hours ending 02/10/22 1640  Filed Weights   02/09/22 0749 02/09/22 1143 02/10/22 0452  Weight: 93.4 kg 90.7 kg 95.2 kg    Examination:  Constitutional:  VS as above General Appearance: NAD, alert  Respiratory: Normal respiratory effort CTABL Neck: No masses, trachea midline No thyroid enlargement/tenderness/mass appreciated Respiratory: Normal respiratory effort Breath sounds normal, no wheeze/rhonchi/rales Cardiovascular: S1/S2 normal, no murmur/rub/gallop auscultated No lower extremity edema Gastrointestinal: Nontender, no masses Musculoskeletal:  No clubbing/cyanosis of digits Neurological: Alert, normal speech Psychiatric: Normal mood and affect       Scheduled Medications:   (feeding supplement) PROSource Plus  30 mL Oral TID BM   amLODipine  10 mg Oral q morning   calcium acetate  1,334 mg Oral TID WC   carbamazepine  1,000 mg Oral q AM   Chlorhexidine Gluconate Cloth  6 each Topical Daily   cloNIDine  0.2 mg Transdermal Weekly    dextrose  1 Tube Oral Once   doxazosin  4 mg Oral QHS   epoetin (EPOGEN/PROCRIT) injection  4,000 Units Intravenous Q M,W,F-HD   ferrous sulfate  325 mg Oral Q breakfast   fluPHENAZine  2.5 mg Oral q AM   heparin  5,000 Units Subcutaneous Q8H   hydrALAZINE  25 mg Oral TID   irbesartan  300 mg Oral Daily   isosorbide mononitrate  60 mg Oral BID   minoxidil  5 mg Oral Daily   mometasone-formoterol  2 puff Inhalation BID   multivitamin  1 tablet Oral QHS   mouth rinse  15 mL Mouth Rinse 4 times per day   pantoprazole  40 mg Oral Daily   patiromer  16.8 g Oral Daily   polyethylene glycol  17 g Oral BID   trihexyphenidyl  2 mg Oral BID WC    Continuous Infusions:  anticoagulant sodium citrate      PRN Medications:  albuterol, alteplase, anticoagulant sodium citrate, diphenhydrAMINE, haloperidol lactate, heparin, hydrALAZINE, lidocaine (PF), lidocaine-prilocaine, ondansetron **OR** ondansetron (ZOFRAN) IV, mouth rinse, pentafluoroprop-tetrafluoroeth, polyethylene glycol, senna-docusate  Antimicrobials:  Anti-infectives (From admission, onward)    None  Data Reviewed: I have personally reviewed following labs and imaging studies  CBC: Recent Labs  Lab 02/04/22 0822 02/10/22 0613  WBC 3.8* 3.7*  NEUTROABS 2.5  --   HGB 10.9* 10.8*  HCT 31.6* 31.7*  MCV 100.3* 101.0*  PLT 167 176    Basic Metabolic Panel: Recent Labs  Lab 02/04/22 0822 02/05/22 0511 02/08/22 0454 02/10/22 0613  NA 128* 132* 133* 133*  K 5.0 4.6 4.7 5.0  CL 90* 93* 96* 96*  CO2 25 27 27 26   GLUCOSE 109* 80 69* 77  BUN 94* 69* 70* 72*  CREATININE 6.29* 5.48* 5.50* 5.72*  CALCIUM 9.1 8.6* 9.1 8.7*  PHOS 6.5*  --   --   --     GFR: Estimated Creatinine Clearance: 16.2 mL/min (A) (by C-G formula based on SCr of 5.72 mg/dL (H)). Liver Function Tests: Recent Labs  Lab 02/04/22 0822  ALBUMIN 3.0*    No results for input(s): "LIPASE", "AMYLASE" in the last 168 hours. No results for  input(s): "AMMONIA" in the last 168 hours. Coagulation Profile: No results for input(s): "INR", "PROTIME" in the last 168 hours. Cardiac Enzymes: No results for input(s): "CKTOTAL", "CKMB", "CKMBINDEX", "TROPONINI" in the last 168 hours. BNP (last 3 results) No results for input(s): "PROBNP" in the last 8760 hours. HbA1C: No results for input(s): "HGBA1C" in the last 72 hours. CBG: Recent Labs  Lab 02/08/22 0738 02/08/22 0939 02/08/22 1013 02/09/22 0507  GLUCAP 70 62* 110* 72    Lipid Profile: No results for input(s): "CHOL", "HDL", "LDLCALC", "TRIG", "CHOLHDL", "LDLDIRECT" in the last 72 hours. Thyroid Function Tests: No results for input(s): "TSH", "T4TOTAL", "FREET4", "T3FREE", "THYROIDAB" in the last 72 hours. Anemia Panel: No results for input(s): "VITAMINB12", "FOLATE", "FERRITIN", "TIBC", "IRON", "RETICCTPCT" in the last 72 hours. Urine analysis: No results found for: "COLORURINE", "APPEARANCEUR", "LABSPEC", "PHURINE", "GLUCOSEU", "HGBUR", "BILIRUBINUR", "KETONESUR", "PROTEINUR", "UROBILINOGEN", "NITRITE", "LEUKOCYTESUR" Sepsis Labs: @LABRCNTIP (procalcitonin:4,lacticidven:4)  No results found for this or any previous visit (from the past 240 hour(s)).       Radiology Studies: CT HEAD WO CONTRAST (5MM)  Result Date: 12/15/2021 CLINICAL DATA:  Altered mental status EXAM: CT HEAD WITHOUT CONTRAST TECHNIQUE: Contiguous axial images were obtained from the base of the skull through the vertex without intravenous contrast. RADIATION DOSE REDUCTION: This exam was performed according to the departmental dose-optimization program which includes automated exposure control, adjustment of the mA and/or kV according to patient size and/or use of iterative reconstruction technique. COMPARISON:  None Available. FINDINGS: Brain: No evidence of acute cortical infarction, hemorrhage, cerebral edema, mass, mass effect, or midline shift. No hydrocephalus or extra-axial fluid collection.  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 Periventricular white matter changes, likely the sequela of chronic small vessel ischemic disease. Vascular: No hyperdense vessel. Skull: Normal. Negative for fracture or focal lesion. Sinuses/Orbits: No acute finding. Other: The mastoid air cells are well aerated. IMPRESSION: No acute intracranial process. Electronically Signed   By: Merilyn Baba M.D.   On: 12/15/2021 14:22   DG Chest Portable 1 View  Result Date: 12/15/2021 CLINICAL DATA:  Altered mental status. EXAM: PORTABLE CHEST 1 VIEW COMPARISON:  None Available. FINDINGS: Mild cardiomegaly is noted. Right internal jugular dialysis catheter is noted with distal tip in expected position of cavoatrial junction. Both lungs are clear. The visualized skeletal structures are unremarkable. IMPRESSION: No active disease. Electronically Signed   By: Marijo Conception M.D.   On: 12/15/2021 14:18  LOS: 54 days       Emeterio Reeve, DO [facility] 02/10/2022, 4:40 PM   Staff may message me via secure chat in [place]  but this may not receive immediate response,  please page for urgent matters!  If 7PM-7AM, please contact night-coverage [email or web address]  Dictation software was used to generate the above note. Typos may occur and escape review, as with typed/written notes. Please contact Dr Sheppard Coil directly for clarity if needed.

## 2022-02-10 NOTE — Progress Notes (Addendum)
Physical Therapy Treatment Patient Details Name: Todd Brown MRN: [id number] DOB: November 11, 1960 Today's Date: 02/10/2022   History of Present Illness 61 year old male with past medical history of hypertension and end-stage renal disease on hemodialysis presented to the emergency room from his group home/skilled nursing facility on 7/17 with confusion.  (Patient's baseline is that he is unable to care for himself and has a legal guardian.)  Patient last received dialysis on Friday, 7/14.  From being in the emergency room on 7/17, unable to get dialysis yesterday.  Patient found to have markedly elevated blood pressure with a systolic in the 697X.  Patient with questionable cardiac ischemia as well.    PT Comments    The patient was pleasant and cooperative throughout session. He remembers this therapist from PT session yesterday. He required minimal assistance for occasional trunk support to sit upright in bed. Good sitting balance demonstrated. Sit to stand transfer training performed with cues for technique. He required only minimal assistance for lifting to stand on the second standing bout. The patient has increased independence with functional mobility when provided with single step commands, increased time for processing, and occasional tactile cues for task initiation. He ambulated 5 ft in the room with the rolling walker with Min guard assistance and was sitting up in the chair at end of session. Anticipate the patient could return to ALF with staff assistance and HHPT. Otherwise, SNF would be recommended. PT will continue to follow to maximize independence and decrease caregiver burden.    Recommendations for follow up therapy are one component of a multi-disciplinary discharge planning process, led by the attending physician.  Recommendations may be updated based on patient status, additional functional criteria and insurance authorization.  Follow Up Recommendations  Skilled nursing-short  term rehab (<3 hours/day) (or return to ALF with assistance) Can patient physically be transported by private vehicle: No   Assistance Recommended at Discharge Frequent or constant Supervision/Assistance  Patient can return home with the following A little help with walking and/or transfers;A little help with bathing/dressing/bathroom;Assistance with feeding;Direct supervision/assist for medications management;Help with stairs or ramp for entrance;Assist for transportation   Equipment Recommendations  Rolling walker (2 wheels);BSC/3in1;Wheelchair cushion (measurements PT);Wheelchair (measurements PT)    Recommendations for Other Services       Precautions / Restrictions Precautions Precautions: Fall Restrictions Weight Bearing Restrictions: No     Mobility  Bed Mobility Overal bed mobility: Needs Assistance Bed Mobility: Supine to Sit     Supine to sit: Min assist     General bed mobility comments: occasional assistance for trunk support to sit upright. patient does best when allowed to have increased time for processing with cues for sequencing. directional cues provided due to vision impariments    Transfers Overall transfer level: Needs assistance Equipment used: Rolling walker (2 wheels) Transfers: Sit to/from Stand Sit to Stand: Min guard, Min assist           General transfer comment: with the first stand, patient required no physical assistance. with the second stand, patient needed only minimal assistance for initial lifting from bed. verbal cues for technique    Ambulation/Gait Ambulation/Gait assistance: Min guard Gait Distance (Feet): 5 Feet Assistive device: Rolling walker (2 wheels) Gait Pattern/deviations: Step-to pattern Gait velocity: decreased     General Gait Details: verbal and tactile cues for sequencing due to vision impairments. no physical assistance required.   Stairs             Emergency planning/management officer  Modified Rankin (Stroke  Patients Only)       Balance Overall balance assessment: Needs assistance Sitting-balance support: Feet supported Sitting balance-[name]: Good     Standing balance support: Bilateral upper extremity supported Standing balance-[name]: Fair Standing balance comment: with RW for UE support. no external support reqired                            Cognition Arousal/Alertness: Awake/alert Behavior During Therapy: Flat affect Overall Cognitive Status: No family/caregiver present to determine baseline cognitive functioning                         Following Commands: Follows one step commands with increased time   Awareness: Intellectual Problem Solving: Slow processing, Decreased initiation, Difficulty sequencing, Requires verbal cues, Requires tactile cues General Comments: patient is oriented to place, time, person. he is cooperative throughout session        Exercises      General Comments General comments (skin integrity, edema, etc.): Sp02 91% sitting upright. no dizziness reported with activity      Pertinent Vitals/Pain Pain Assessment Pain Assessment: No/denies pain    Home Living                          Prior Function            PT Goals (current goals can now be found in the care plan section) Acute Rehab PT Goals Patient Stated Goal: none stated PT Goal Formulation: Patient unable to participate in goal setting Time For Goal Achievement: 02/21/22 Potential to Achieve Goals: Fair Progress towards PT goals: Progressing toward goals    Frequency    Min 2X/week      PT Plan Current plan remains appropriate    Co-evaluation              AM-PAC PT "6 Clicks" Mobility   Outcome Measure  Help needed turning from your back to your side while in a flat bed without using bedrails?: None Help needed moving from lying on your back to sitting on the side of a flat bed without using bedrails?: A Little Help  needed moving to and from a bed to a chair (including a wheelchair)?: A Little Help needed standing up from a chair using your arms (e.g., wheelchair or bedside chair)?: A Little Help needed to walk in hospital room?: A Little Help needed climbing 3-5 steps with a railing? : Total 6 Click Score: 17    End of Session Equipment Utilized During Treatment: Oxygen Activity Tolerance: Patient tolerated treatment well Patient left: in chair;with call bell/phone within reach;with chair alarm set Nurse Communication: Mobility status PT Visit Diagnosis: Other abnormalities of gait and mobility (R26.89);Muscle weakness (generalized) (M62.81);Unsteadiness on feet (R26.81)     Time: 5697-9480 PT Time Calculation (min) (ACUTE ONLY): 19 min  Charges:  $Therapeutic Activity: 8-22 mins                     [name], PT, MPT    [name] 02/10/2022, 9:10 AM

## 2022-02-10 NOTE — Progress Notes (Signed)
   02/09/22 0803 02/09/22 0830 02/09/22 0900  Vitals  BP (!) 171/88 (!) 174/77 (!) 155/74  MAP (mmHg) 101 107 98  BP Location Right Arm Right Arm Right Arm  BP Method Automatic Automatic Automatic  Patient Position (if appropriate) Lying Lying Lying  Pulse Rate 62 64 (!) 59  Pulse Rate Source Monitor Monitor Monitor  ECG Heart Rate 63 64 60  Resp 12 13 11   During Treatment Monitoring  Blood Flow Rate (mL/min) 400 mL/min 400 mL/min 400 mL/min  Arterial Pressure (mmHg) -130 mmHg -140 mmHg -140 mmHg  Venous Pressure (mmHg) 250 mmHg 270 mmHg 260 mmHg  TMP (mmHg) 7 mmHg 8 mmHg 8 mmHg  Ultrafiltration Rate (mL/min) 1114 mL/min 1114 mL/min 1114 mL/min  Dialysate Flow Rate (mL/min) 300 ml/min 300 ml/min 300 ml/min  HD Safety Checks Performed Yes Yes Yes  Intra-Hemodialysis Comments Tx initiated Progressing as prescribed Progressing as prescribed  Dialysis Fluid Bolus Normal Saline  --   --   Bolus Amount (mL) 300 mL  --   --     02/09/22 0930 02/09/22 1000 02/09/22 1030  Vitals  BP (!) 162/85 (!) 161/81 (!) 168/86  MAP (mmHg) 107 106 108  BP Location Right Arm Right Arm Right Arm  BP Method Automatic Automatic Automatic  Patient Position (if appropriate) Lying Lying Lying  Pulse Rate 62 60 75  Pulse Rate Source Monitor Monitor  --   ECG Heart Rate 62 61 74  Resp 15 11 14   During Treatment Monitoring  Blood Flow Rate (mL/min) 400 mL/min 400 mL/min 400 mL/min  Arterial Pressure (mmHg) -140 mmHg -150 mmHg -140 mmHg  Venous Pressure (mmHg) 260 mmHg 250 mmHg 260 mmHg  TMP (mmHg) 9 mmHg 8 mmHg 7 mmHg  Ultrafiltration Rate (mL/min) 1114 mL/min 1114 mL/min 1026 mL/min  Dialysate Flow Rate (mL/min) 300 ml/min 300 ml/min 300 ml/min  HD Safety Checks Performed Yes Yes Yes  Intra-Hemodialysis Comments Progressing as prescribed Progressing as prescribed Progressing as prescribed  Dialysis Fluid Bolus  --   --   --   Bolus Amount (mL)  --   --   --

## 2022-02-10 NOTE — Progress Notes (Signed)
   02/09/22 1100 02/09/22 1130 02/09/22 1143  Vitals  Temp  --   --  98.3 F (36.8 C)  Temp Source  --   --  Oral  BP 133/73 121/68 118/71  MAP (mmHg) 87 84 92  BP Location Right Arm Right Arm Right Arm  BP Method Automatic Automatic Automatic  Patient Position (if appropriate) Lying Lying Sitting  Pulse Rate 68 66 67  Pulse Rate Source Monitor Monitor Monitor  ECG Heart Rate 68 66 67  Resp 12 15 12   During Treatment Monitoring  Blood Flow Rate (mL/min) 400 mL/min 400 mL/min 400 mL/min  Arterial Pressure (mmHg) -170 mmHg -170 mmHg -170 mmHg  Venous Pressure (mmHg) 300 mmHg 300 mmHg 300 mmHg  TMP (mmHg) 4 mmHg 4 mmHg 5 mmHg  Ultrafiltration Rate (mL/min) 1026 mL/min 1026 mL/min 1026 mL/min  Dialysate Flow Rate (mL/min) 300 ml/min 300 ml/min 300 ml/min  HD Safety Checks Performed Yes Yes Yes  Intra-Hemodialysis Comments Progressing as prescribed Progressing as prescribed Tx completed

## 2022-02-10 NOTE — Progress Notes (Signed)
   02/05/22 1030 02/05/22 1100 02/05/22 1130  Vitals  Temp  --   --   --   Temp Source  --   --   --   BP 118/65 114/73 119/73  MAP (mmHg) 83 86 89  BP Location Right Arm Right Arm Right Arm  BP Method Automatic Automatic Automatic  Patient Position (if appropriate) Lying Lying Lying  Pulse Rate 61 61 (!) 58  Pulse Rate Source Monitor Monitor Monitor  ECG Heart Rate 61 61 (!) 58  Resp 17 17 13   During Treatment Monitoring  Blood Flow Rate (mL/min) 400 mL/min 400 mL/min 400 mL/min  Arterial Pressure (mmHg) -180 mmHg -160 mmHg -130 mmHg  Venous Pressure (mmHg) 270 mmHg 300 mmHg 240 mmHg  TMP (mmHg) 6 mmHg 7 mmHg 6 mmHg  Ultrafiltration Rate (mL/min) 1300 mL/min 1300 mL/min 1300 mL/min  Dialysate Flow Rate (mL/min) 300 ml/min 300 ml/min 300 ml/min  HD Safety Checks Performed Yes Yes Yes  Intra-Hemodialysis Comments Progressing as prescribed Progressing as prescribed (Pt resting, no c/o, vss, ufr 350) Progressing as prescribed (Pt resting, no c/o, vss, ufr 700)    02/05/22 1200 02/05/22 1230 02/05/22 1235  Vitals  Temp  --   --  97.7 F (36.5 C)  Temp Source  --   --  Axillary  BP 137/84 (!) 160/72 (!) 145/71  MAP (mmHg) 102 98 88  BP Location Right Arm Right Arm Right Arm  BP Method Automatic Automatic Automatic  Patient Position (if appropriate) Lying Lying Lying  Pulse Rate (!) 59 60 60  Pulse Rate Source Monitor Monitor Monitor  ECG Heart Rate (!) 59 60 60  Resp 12 14 17   During Treatment Monitoring  Blood Flow Rate (mL/min) 400 mL/min 400 mL/min  --   Arterial Pressure (mmHg) -140 mmHg -130 mmHg  --   Venous Pressure (mmHg) 230 mmHg 240 mmHg  --   TMP (mmHg) 7 mmHg 70 mmHg  --   Ultrafiltration Rate (mL/min) 1299 mL/min 1299 mL/min  --   Dialysate Flow Rate (mL/min) 300 ml/min 300 ml/min  --   HD Safety Checks Performed Yes Yes  --   Intra-Hemodialysis Comments Progressing as prescribed (Pt resting, no c/o, vss, ufr 1260) Progressing as prescribed (Pt resting, no  c/o, vss, ufr 1890) Tolerated well;Tx completed

## 2022-02-11 DIAGNOSIS — G9341 Metabolic encephalopathy: Secondary | ICD-10-CM | POA: Diagnosis not present

## 2022-02-11 MED ORDER — EPOETIN ALFA 4000 UNIT/ML IJ SOLN
INTRAMUSCULAR | Status: AC
  Administered 2022-02-11: 4000 [IU] via SUBCUTANEOUS
  Filled 2022-02-11: qty 1

## 2022-02-11 NOTE — Progress Notes (Signed)
PROGRESS NOTE    Todd Brown  XTG:[id number] DOB: [birth year] DOA: 12/15/2021 PCP: Jodi Marble, MD   Brief Narrative:  61 year old male with past medical history of hypertension and end-stage renal disease on hemodialysis presented to the emergency room from his group home/Assisted living facility on 7/17 with confusion.  (Patient's baseline was that he is unable to care for himself and has a legal guardian.)  Patient last received dialysis on Friday, 7/14.  Patient was found to be in hypertensive urgency requiring Cardene drip.  CT head was negative.  During the hospitalization patient's home oral medications were started and nephrology was consulted for dialysis.  Off-and-on patient had some agitation requiring psychiatry evaluation.  He was started on as needed and scheduled Haldol which has improved his mentation.  Hospital course also complicated by periorbital swelling/edema which was treated with steroids.  Currently TOC working on placement.     Assessment & Plan:  Active Problems:   Hyperkalemia   ESRD on dialysis [facility])   Mental health disorder   Essential hypertension   Oral thrush   Physical deconditioning   Malnutrition of moderate degree   Anemia of chronic disease   Hyponatremia    Hyperkalemia -resolved Resolved HD   Hypertensive emergency-resolved as of 12/31/2021 Essential HTN Resolved.  Current Meds= Norvasc, Cardura, Hydralazine, Avapro, Minoxidil, Clonidine patch   ESRD on dialysis [facility]) HD per Nephrology.    Mental health disorder -stable Unspecified.  It is unclear if he is ever had a formal diagnosis.   Patient is unable to care for himself.   He is for the most part much more calm and cooperative He is on Tegretol and Artane which he should continue.   As needed Haldol for acute behavior agitation   Placement pending.    Oral thrush -resolved 8/23: start nystatin suspension -completed course   COPD exacerbation ([place]) - resolved as of  12/18/2021 Monitor O2 w/ VS   Physical deconditioning PT OT = SNF, pending placement.    Anemia of chronic disease Hbg stable. Check CBC ~weekly.   Malnutrition of moderate degree Nutrition Status: Nutrition Problem: Moderate Malnutrition Etiology: chronic illness (ESRD on HD) Signs/Symptoms: mild fat depletion, mild muscle depletion, moderate muscle depletion Interventions: Refer to RD note for recommendations, Nepro shake, MVI, Prostat Body mass index is 27 kg/m.  Nutrition Problem: Moderate Malnutrition Etiology: chronic illness (ESRD on HD)    Hypoglycemia - resolved Poor po intake Treated without complication Monitor     DVT prophylaxis: SQ Heparin.  Code Status:  Family Communication:    Currently TOC working on safe Artist.   Signs/Symptoms: mild fat depletion, mild muscle depletion, moderate muscle depletion  Interventions: Refer to RD note for recommendations, Nepro shake, MVI, Prostat  Body mass index is 28.46 kg/m.         Subjective:  Feels ok, no acute events overnight.    Examination:  General exam: Appears calm and comfortable  Respiratory system: Clear to auscultation. Respiratory effort normal. Cardiovascular system: S1 & S2 heard, RRR. No JVD, murmurs, rubs, gallops or clicks. 2+ b/l LE pitting edema. Gastrointestinal system: Abdomen is nondistended, soft and nontender. No organomegaly or masses felt. Normal bowel sounds heard. Central nervous system: Alert and oriented. No focal neurological deficits. Extremities: Symmetric 5 x 5 power. Skin: No rashes, lesions or ulcers Psychiatry: Judgement and insight appear normal. Mood & affect appropriate.   Left sided avf.   Objective: Vitals:   02/10/22 0452 02/10/22 0846 02/10/22 2005 02/11/22  0449  BP:  (!) 153/85 132/72 (!) 97/59  Pulse:  68 69 64  Resp:  17 20 18   Temp:  98.5 F (36.9 C) 97.8 F (36.6 C) 97.7 F (36.5 C)  TempSrc:   Oral Oral  SpO2:  92% 96% 97%  Weight: 95.2  kg     Height:        Intake/Output Summary (Last 24 hours) at 02/11/2022 0747 Last data filed at 02/11/2022 0600 Gross per 24 hour  Intake --  Output 750 ml  Net -750 ml   Filed Weights   02/09/22 0749 02/09/22 1143 02/10/22 0452  Weight: 93.4 kg 90.7 kg 95.2 kg     Data Reviewed:   CBC: Recent Labs  Lab 02/04/22 0822 02/10/22 0613  WBC 3.8* 3.7*  NEUTROABS 2.5  --   HGB 10.9* 10.8*  HCT 31.6* 31.7*  MCV 100.3* 101.0*  PLT 167 270   Basic Metabolic Panel: Recent Labs  Lab 02/04/22 0822 02/05/22 0511 02/08/22 0454 02/10/22 0613  NA 128* 132* 133* 133*  K 5.0 4.6 4.7 5.0  CL 90* 93* 96* 96*  CO2 25 27 27 26   GLUCOSE 109* 80 69* 77  BUN 94* 69* 70* 72*  CREATININE 6.29* 5.48* 5.50* 5.72*  CALCIUM 9.1 8.6* 9.1 8.7*  PHOS 6.5*  --   --   --    GFR: Estimated Creatinine Clearance: 16.2 mL/min (A) (by C-G formula based on SCr of 5.72 mg/dL (H)). Liver Function Tests: Recent Labs  Lab 02/04/22 0822  ALBUMIN 3.0*   No results for input(s): "LIPASE", "AMYLASE" in the last 168 hours. No results for input(s): "AMMONIA" in the last 168 hours. Coagulation Profile: No results for input(s): "INR", "PROTIME" in the last 168 hours. Cardiac Enzymes: No results for input(s): "CKTOTAL", "CKMB", "CKMBINDEX", "TROPONINI" in the last 168 hours. BNP (last 3 results) No results for input(s): "PROBNP" in the last 8760 hours. HbA1C: No results for input(s): "HGBA1C" in the last 72 hours. CBG: Recent Labs  Lab 02/08/22 0738 02/08/22 0939 02/08/22 1013 02/09/22 0507  GLUCAP 70 62* 110* 72   Lipid Profile: No results for input(s): "CHOL", "HDL", "LDLCALC", "TRIG", "CHOLHDL", "LDLDIRECT" in the last 72 hours. Thyroid Function Tests: No results for input(s): "TSH", "T4TOTAL", "FREET4", "T3FREE", "THYROIDAB" in the last 72 hours. Anemia Panel: No results for input(s): "VITAMINB12", "FOLATE", "FERRITIN", "TIBC", "IRON", "RETICCTPCT" in the last 72 hours. Sepsis Labs: No  results for input(s): "PROCALCITON", "LATICACIDVEN" in the last 168 hours.  No results found for this or any previous visit (from the past 240 hour(s)).       Radiology Studies: DG Chest 1 View  Result Date: 02/10/2022 CLINICAL DATA:  End-stage renal disease. EXAM: CHEST  1 VIEW COMPARISON:  12/17/2021 FINDINGS: Stable moderate cardiac enlargement. Stable positioning and appearance right-sided tunneled hemodialysis catheter with catheter tip projecting at the SVC/RA junction. Lungs demonstrate chronic component of pulmonary venous hypertension and probable mild chronic pulmonary interstitial edema without overt airspace edema. No airspace consolidation or pneumothorax. Trace left pleural fluid. IMPRESSION: Chronic pulmonary venous hypertension/pulmonary interstitial edema. Trace left pleural fluid. Electronically Signed   By: Aletta Edouard M.D.   On: 02/10/2022 12:16        Scheduled Meds:  (feeding supplement) PROSource Plus  30 mL Oral TID BM   amLODipine  10 mg Oral q morning   calcium acetate  1,334 mg Oral TID WC   carbamazepine  1,000 mg Oral q AM   Chlorhexidine Gluconate Cloth  6 each Topical Daily   cloNIDine  0.2 mg Transdermal Weekly   dextrose  1 Tube Oral Once   doxazosin  4 mg Oral QHS   epoetin (EPOGEN/PROCRIT) injection  4,000 Units Intravenous Q M,W,F-HD   ferrous sulfate  325 mg Oral Q breakfast   fluPHENAZine  2.5 mg Oral q AM   heparin  5,000 Units Subcutaneous Q8H   hydrALAZINE  25 mg Oral TID   irbesartan  300 mg Oral Daily   isosorbide mononitrate  60 mg Oral BID   minoxidil  5 mg Oral Daily   mometasone-formoterol  2 puff Inhalation BID   multivitamin  1 tablet Oral QHS   mouth rinse  15 mL Mouth Rinse 4 times per day   pantoprazole  40 mg Oral Daily   patiromer  16.8 g Oral Daily   polyethylene glycol  17 g Oral BID   trihexyphenidyl  2 mg Oral BID WC   Continuous Infusions:  anticoagulant sodium citrate       LOS: 58 days   Time spent= 35  mins    Lauramae Kneisley Arsenio Loader, MD [facility]  If 7PM-7AM, please contact night-coverage  02/11/2022, 7:47 AM

## 2022-02-11 NOTE — Progress Notes (Signed)
PT Cancellation Note  Patient Details Name: Todd Brown MRN: [id number] DOB: 08-10-1960   Cancelled Treatment:    Reason Eval/Treat Not Completed: Patient at procedure or test/unavailable. Pt currently at HD. Will follow up later this PM for PT session.    Genie Mirabal A Brylen Wagar 02/11/2022, 10:30 AM

## 2022-02-11 NOTE — Progress Notes (Signed)
[facility]  PROGRESS NOTE   Subjective:   Patient seen and evaluated during dialysis   HEMODIALYSIS FLOWSHEET:  Blood Flow Rate (mL/min): 400 mL/min Arterial Pressure (mmHg): -160 mmHg Venous Pressure (mmHg): 250 mmHg TMP (mmHg): 7 mmHg Ultrafiltration Rate (mL/min): 1000 mL/min Dialysate Flow Rate (mL/min): 300 ml/min Dialysis Fluid Bolus: Normal Saline Bolus Amount (mL): 300 mL  Sitting up, eating breakfast Tolerating treatment well   Objective:  Vital signs: Blood pressure 115/72, pulse 65, temperature 98.3 F (36.8 C), temperature source Oral, resp. rate 11, height 6' (1.829 m), weight 95.2 kg, SpO2 99 %.  Intake/Output Summary (Last 24 hours) at 02/11/2022 1045 Last data filed at 02/11/2022 0914 Gross per 24 hour  Intake 30 ml  Output 750 ml  Net -720 ml    Filed Weights   02/09/22 0749 02/09/22 1143 02/10/22 0452  Weight: 93.4 kg 90.7 kg 95.2 kg     Physical Exam: General:  No acute distress  Head:  Normocephalic, atraumatic. Moist oral mucosal membranes  Eyes:  Anicteric  Lungs:   Clear to auscultation, normal effort  Heart:  S1S2 no rubs  Abdomen:   Soft, nontender, bowel sounds present  Extremities:  2+ peripheral edema.  Neurologic:  Awake, alert, following commands  Skin:  No lesions  Access: Lt AVF     Basic Metabolic Panel: Recent Labs  Lab 02/05/22 0511 02/08/22 0454 02/10/22 0613  NA 132* 133* 133*  K 4.6 4.7 5.0  CL 93* 96* 96*  CO2 27 27 26   GLUCOSE 80 69* 77  BUN 69* 70* 72*  CREATININE 5.48* 5.50* 5.72*  CALCIUM 8.6* 9.1 8.7*     CBC: Recent Labs  Lab 02/10/22 0613  WBC 3.7*  HGB 10.8*  HCT 31.7*  MCV 101.0*  PLT 152      Urinalysis: No results for input(s): "COLORURINE", "LABSPEC", "PHURINE", "GLUCOSEU", "HGBUR", "BILIRUBINUR", "KETONESUR", "PROTEINUR", "UROBILINOGEN", "NITRITE", "LEUKOCYTESUR" in the last 72 hours.  Invalid input(s): "APPERANCEUR"    Imaging: DG Chest 1 View  Result Date:  02/10/2022 CLINICAL DATA:  End-stage renal disease. EXAM: CHEST  1 VIEW COMPARISON:  12/17/2021 FINDINGS: Stable moderate cardiac enlargement. Stable positioning and appearance right-sided tunneled hemodialysis catheter with catheter tip projecting at the SVC/RA junction. Lungs demonstrate chronic component of pulmonary venous hypertension and probable mild chronic pulmonary interstitial edema without overt airspace edema. No airspace consolidation or pneumothorax. Trace left pleural fluid. IMPRESSION: Chronic pulmonary venous hypertension/pulmonary interstitial edema. Trace left pleural fluid. Electronically Signed   By: Aletta Edouard M.D.   On: 02/10/2022 12:16     Medications:    anticoagulant sodium citrate      (feeding supplement) PROSource Plus  30 mL Oral TID BM   amLODipine  10 mg Oral q morning   calcium acetate  1,334 mg Oral TID WC   carbamazepine  1,000 mg Oral q AM   Chlorhexidine Gluconate Cloth  6 each Topical Daily   cloNIDine  0.2 mg Transdermal Weekly   dextrose  1 Tube Oral Once   doxazosin  4 mg Oral QHS   epoetin (EPOGEN/PROCRIT) injection  4,000 Units Intravenous Q M,W,F-HD   ferrous sulfate  325 mg Oral Q breakfast   fluPHENAZine  2.5 mg Oral q AM   heparin  5,000 Units Subcutaneous Q8H   hydrALAZINE  25 mg Oral TID   irbesartan  300 mg Oral Daily   isosorbide mononitrate  60 mg Oral BID   minoxidil  5 mg Oral Daily  mometasone-formoterol  2 puff Inhalation BID   multivitamin  1 tablet Oral QHS   mouth rinse  15 mL Mouth Rinse 4 times per day   pantoprazole  40 mg Oral Daily   patiromer  16.8 g Oral Daily   polyethylene glycol  17 g Oral BID   trihexyphenidyl  2 mg Oral BID WC    Assessment/ Plan:     Active Problems:   Essential hypertension   ESRD on dialysis [facility])   Mental health disorder   Physical deconditioning   Oral thrush   Malnutrition of moderate degree   Hyperkalemia   Anemia of chronic disease   Hyponatremia  61 year old male with  history of hypertension, anemia, schizophrenia, end-stage renal disease on hemodialysis on Monday Wednesday Friday schedule now admitted with history of mental status changes and accelerated hypertension.  [facility] [place] [facility]/MWF/Lt AVF   #1: End-stage renal disease on hemodialysis.   Receiving dialysis today, UF goal 3L as tolerated. Slowly improving lower extremity edema. Next treatment scheduled for Friday.   Renal navigator monitoring discharge as patient will need an outpatient dialysis clinic at discharge.    #2: Anemia:hemoglobin 10.8. Continue low-dose EPO with treatments.   #3: Secondary parathyroidism: Will continue to monitor bone minerals during this admission. Currently receiving calcium acetate with meals.   #4: Hypertension: Patient has poorly controlled hypertension he has been on minoxidil, isosorbide, irbesartan, amlodipine, clonidine, hydralazine and doxazosin.  Blood pressure 113/63 during dialysis.   LOS: 39 [facility] 9/13/202310:45 AM

## 2022-02-11 NOTE — Progress Notes (Signed)
PT did 3.5 hrs of HD tx with no issues Tolerated well  UF = 3000  Report given to floor R.N Thrill and bruit present on HD access  BP 124/67 Temp 98 Hr 58 Rr 13 O2sat 99

## 2022-02-12 DIAGNOSIS — G9341 Metabolic encephalopathy: Secondary | ICD-10-CM | POA: Diagnosis not present

## 2022-02-12 MED ORDER — MINOXIDIL 2.5 MG PO TABS
2.5000 mg | ORAL_TABLET | Freq: Every day | ORAL | Status: DC
  Administered 2022-02-13 – 2022-02-22 (×8): 2.5 mg via ORAL
  Filled 2022-02-12 (×10): qty 1

## 2022-02-12 NOTE — Plan of Care (Signed)
  Problem: Education: Goal: Knowledge of General Education information will improve Description: Including pain rating scale, medication(s)/side effects and non-pharmacologic comfort measures Outcome: Progressing   Problem: Clinical Measurements: Goal: Ability to maintain clinical measurements within normal limits will improve Outcome: Progressing Goal: Will remain free from infection Outcome: Progressing Goal: Diagnostic test results will improve Outcome: Progressing Goal: Respiratory complications will improve Outcome: Progressing Goal: Cardiovascular complication will be avoided Outcome: Progressing   Problem: Safety: Goal: Ability to remain free from injury will improve Outcome: Progressing   Problem: Skin Integrity: Goal: Risk for impaired skin integrity will decrease Outcome: Progressing

## 2022-02-12 NOTE — Progress Notes (Signed)
[facility]  PROGRESS NOTE   Subjective:   Patient sitting up in the chair, taking his meds. No acute complaints Continues to have large amount of lower extremity edema 3000 cc of fluid was removed on Wednesday with dialysis    Objective:  Vital signs: Blood pressure (!) 152/76, pulse 68, temperature 98.6 F (37 C), resp. rate 16, height 6' (1.829 m), weight 93 kg, SpO2 100 %.  Intake/Output Summary (Last 24 hours) at 02/12/2022 1432 Last data filed at 02/12/2022 1417 Gross per 24 hour  Intake 60 ml  Output --  Net 60 ml    Filed Weights   02/11/22 0922 02/11/22 1314 02/12/22 0500  Weight: 94.5 kg 91.9 kg 93 kg     Physical Exam: General:  No acute distress  Head:  Normocephalic, atraumatic. Moist oral mucosal membranes  Eyes:  Anicteric  Lungs:   Clear to auscultation, normal effort  Heart:  S1S2 no rubs  Abdomen:   Soft, nontender, bowel sounds present  Extremities:  2+ peripheral edema.  Neurologic:  Awake, alert, following commands  Skin:  No lesions  Access: Lt AVF     Basic Metabolic Panel: Recent Labs  Lab 02/08/22 0454 02/10/22 0613  NA 133* 133*  K 4.7 5.0  CL 96* 96*  CO2 27 26  GLUCOSE 69* 77  BUN 70* 72*  CREATININE 5.50* 5.72*  CALCIUM 9.1 8.7*     CBC: Recent Labs  Lab 02/10/22 0613  WBC 3.7*  HGB 10.8*  HCT 31.7*  MCV 101.0*  PLT 152      Urinalysis: No results for input(s): "COLORURINE", "LABSPEC", "PHURINE", "GLUCOSEU", "HGBUR", "BILIRUBINUR", "KETONESUR", "PROTEINUR", "UROBILINOGEN", "NITRITE", "LEUKOCYTESUR" in the last 72 hours.  Invalid input(s): "APPERANCEUR"    Imaging: No results found.   Medications:    anticoagulant sodium citrate      (feeding supplement) PROSource Plus  30 mL Oral TID BM   amLODipine  10 mg Oral q morning   calcium acetate  1,334 mg Oral TID WC   carbamazepine  1,000 mg Oral q AM   Chlorhexidine Gluconate Cloth  6 each Topical Daily   cloNIDine  0.2 mg Transdermal  Weekly   dextrose  1 Tube Oral Once   doxazosin  4 mg Oral QHS   epoetin (EPOGEN/PROCRIT) injection  4,000 Units Intravenous Q M,W,F-HD   ferrous sulfate  325 mg Oral Q breakfast   fluPHENAZine  2.5 mg Oral q AM   heparin  5,000 Units Subcutaneous Q8H   hydrALAZINE  25 mg Oral TID   irbesartan  300 mg Oral Daily   isosorbide mononitrate  60 mg Oral BID   minoxidil  5 mg Oral Daily   mometasone-formoterol  2 puff Inhalation BID   multivitamin  1 tablet Oral QHS   mouth rinse  15 mL Mouth Rinse 4 times per day   pantoprazole  40 mg Oral Daily   patiromer  16.8 g Oral Daily   polyethylene glycol  17 g Oral BID   trihexyphenidyl  2 mg Oral BID WC    Assessment/ Plan:     Active Problems:   Essential hypertension   ESRD on dialysis [facility])   Mental health disorder   Physical deconditioning   Oral thrush   Malnutrition of moderate degree   Hyperkalemia   Anemia of chronic disease   Hyponatremia  61 year old male with history of hypertension, anemia, schizophrenia, end-stage renal disease on hemodialysis on Monday Wednesday Friday schedule now admitted with history  of mental status changes and accelerated hypertension.  [facility] [place]/MWF/Lt AVF   #1: End-stage renal disease on hemodialysis.   Receiving dialysis today, UF goal 3L as tolerated. Slowly improving lower extremity edema. Next treatment scheduled for Friday.   Renal navigator monitoring discharge as patient will need an outpatient dialysis clinic at discharge.    #2: Anemia with chronic kidney disease: Lab Results  Component Value Date   HGB 10.8 (L) 02/10/2022    Continue low-dose EPO with treatments.   #3: Secondary parathyroidism: Will continue to monitor bone minerals during this admission. Currently receiving calcium acetate with meals. Lab Results  Component Value Date   CALCIUM 8.7 (L) 02/10/2022   PHOS 6.5 (H) 02/04/2022      #4: Hypertension with lower extremity edema: Patient has poorly  controlled hypertension he has been on minoxidil, isosorbide, irbesartan, amlodipine, clonidine, hydralazine and doxazosin. -We will reduce the dose of minoxidil to allow higher blood pressures during dialysis -Volume removal with dialysis as tolerated.   LOS: [facility] 9/14/20232:32 PM

## 2022-02-12 NOTE — Progress Notes (Signed)
Physical Therapy Treatment Patient Details Name: Todd Brown MRN: [id number] DOB: 14-May-1961 Today's Date: 02/12/2022   History of Present Illness 61 year old male with past medical history of hypertension and end-stage renal disease on hemodialysis presented to the emergency room from his group home/skilled nursing facility on 7/17 with confusion.  (Patient's baseline is that he is unable to care for himself and has a legal guardian.)  Patient last received dialysis on Friday, 7/14.  From being in the emergency room on 7/17, unable to get dialysis yesterday.  Patient found to have markedly elevated blood pressure with a systolic in the 409W.  Patient with questionable cardiac ischemia as well.    PT Comments    Pt is agreeable to PT. He requires some coaching and encouragement for completion of today's session, but demonstrates improved participation when more directly involved in goal setting for the day's session. The pt demonstrates need for initiation assistance only with tactile and verbal cues to achieve EOB. He also demonstrates independence with scooting for foot flat contact and comfort while EOB. The pt does demonstrate some self limiting behaviors stating he is too old for exercise, but is pleased with himself following sit<>stand training and transfer training. The pt would be an excellent candidate to return to his ALF if appropriate assistance levels can be provided to him, if not the pt will require SNF placement.     Recommendations for follow up therapy are one component of a multi-disciplinary discharge planning process, led by the attending physician.  Recommendations may be updated based on patient status, additional functional criteria and insurance authorization.  Follow Up Recommendations  Skilled nursing-short term rehab (<3 hours/day) (Can return to ALF if assistance can be provided.) Can patient physically be transported by private vehicle: Yes   Assistance Recommended  at Discharge Intermittent Supervision/Assistance  Patient can return home with the following A little help with walking and/or transfers;A little help with bathing/dressing/bathroom;Direct supervision/assist for medications management;Help with stairs or ramp for entrance;Assist for transportation;Assistance with feeding;Assistance with cooking/housework;Direct supervision/assist for financial management   Equipment Recommendations  Rolling walker (2 wheels);BSC/3in1;Wheelchair cushion (measurements PT);Wheelchair (measurements PT)    Recommendations for Other Services       Precautions / Restrictions Precautions Precautions: Fall;Other (comment) (Blind) Restrictions Weight Bearing Restrictions: No RLE Weight Bearing: Weight bearing as tolerated LLE Weight Bearing: Weight bearing as tolerated     Mobility  Bed Mobility Overal bed mobility: Needs Assistance Bed Mobility: Supine to Sit     Supine to sit: Min assist     General bed mobility comments: Tactile and Verbal cues d/t patient's visual impairment    Transfers Overall transfer level: Needs assistance Equipment used: Rolling walker (2 wheels) Transfers: Sit to/from Stand, Bed to chair/wheelchair/BSC Sit to Stand: Supervision   Step pivot transfers: Min assist       General transfer comment: Pt requiring increased cueing for directions to safely transfer to bedside chair.    Ambulation/Gait               General Gait Details: Pt refusing progression of mobility to ambulation this session.   Stairs             Wheelchair Mobility    Modified Rankin (Stroke Patients Only)       Balance Overall balance assessment: Needs assistance Sitting-balance support: Feet supported Sitting balance-[name]: Normal     Standing balance support: Reliant on assistive device for balance Standing balance-[name]: Fair  Cognition Arousal/Alertness:  Awake/alert Behavior During Therapy: WFL for tasks assessed/performed Overall Cognitive Status: Within Functional Limits for tasks assessed                                          Exercises      General Comments        Pertinent Vitals/Pain Pain Assessment Pain Assessment: No/denies pain    Home Living                          Prior Function            PT Goals (current goals can now be found in the care plan section) Acute Rehab PT Goals Patient Stated Goal: leave the hospital PT Goal Formulation: With patient Time For Goal Achievement: 02/21/22 Potential to Achieve Goals: Fair Progress towards PT goals: Progressing toward goals    Frequency    Min 2X/week      PT Plan Current plan remains appropriate    Co-evaluation              AM-PAC PT "6 Clicks" Mobility   Outcome Measure  Help needed turning from your back to your side while in a flat bed without using bedrails?: None Help needed moving from lying on your back to sitting on the side of a flat bed without using bedrails?: A Little Help needed moving to and from a bed to a chair (including a wheelchair)?: A Little Help needed standing up from a chair using your arms (e.g., wheelchair or bedside chair)?: A Little Help needed to walk in hospital room?: A Little Help needed climbing 3-5 steps with a railing? : A Lot 6 Click Score: 18    End of Session Equipment Utilized During Treatment: Oxygen;Gait belt Activity Tolerance: Patient tolerated treatment well Patient left: in chair;with call bell/phone within reach;with chair alarm set;with nursing/sitter in room Nurse Communication: Mobility status PT Visit Diagnosis: Other abnormalities of gait and mobility (R26.89);Muscle weakness (generalized) (M62.81);Unsteadiness on feet (R26.81)     Time: 0929-1000 PT Time Calculation (min) (ACUTE ONLY): 31 min  Charges:  $Therapeutic Activity: 23-37 mins                      10:52 AM, 02/12/22 Lenyx Boody A. Saverio Danker PT, DPT Physical Therapist - [facility]    Rosell Khouri A Kamen Hanken 02/12/2022, 10:49 AM

## 2022-02-12 NOTE — Progress Notes (Signed)
Bed offer from [place].Attempt to contact admissions regarding when patient can be accepted to facility. No answer. Reattempt [facility] at [phone]. Line does not ring properly.

## 2022-02-12 NOTE — Progress Notes (Signed)
PROGRESS NOTE    Todd Brown  FFM:[id number] DOB: 09-Mar-1961 DOA: 12/15/2021 PCP: Jodi Marble, MD   Brief Narrative:  61 year old male with past medical history of hypertension and end-stage renal disease on hemodialysis presented to the emergency room from his group home/Assisted living facility on 7/17 with confusion.  (Patient's baseline was that he is unable to care for himself and has a legal guardian.)  Patient last received dialysis on Friday, 7/14.  Patient was found to be in hypertensive urgency requiring Cardene drip.  CT head was negative.  During the hospitalization patient's home oral medications were started and nephrology was consulted for dialysis.  Off-and-on patient had some agitation requiring psychiatry evaluation.  He was started on as needed and scheduled Haldol which has improved his mentation.  Hospital course also complicated by periorbital swelling/edema which was treated with steroids.  Currently TOC working on placement.     Assessment & Plan:  Active Problems:   Hyperkalemia   ESRD on dialysis [facility])   Mental health disorder   Essential hypertension   Oral thrush   Physical deconditioning   Malnutrition of moderate degree   Anemia of chronic disease   Hyponatremia    Hyperkalemia -resolved Resolved HD   Hypertensive emergency-resolved as of 12/31/2021 Essential HTN Resolved.  Current Meds= Norvasc, Cardura, Hydralazine, Avapro, Minoxidil, Clonidine patch   ESRD on dialysis [facility]) HD per Nephrology.    Mental health disorder -stable Unspecified.  It is unclear if he is ever had a formal diagnosis.   Patient is unable to care for himself.   He is for the most part much more calm and cooperative He is on Tegretol and Artane which he should continue.   As needed Haldol for acute behavior agitation   Placement pending.    Oral thrush -resolved 8/23: start nystatin suspension -completed course   COPD exacerbation ([place]) - resolved as of  12/18/2021 Monitor O2 w/ VS   Physical deconditioning PT OT = SNF, pending placement.    Anemia of chronic disease Hbg stable. Check CBC ~weekly.   Malnutrition of moderate degree Nutrition Status: Nutrition Problem: Moderate Malnutrition Etiology: chronic illness (ESRD on HD) Signs/Symptoms: mild fat depletion, mild muscle depletion, moderate muscle depletion Interventions: Refer to RD note for recommendations, Nepro shake, MVI, Prostat Body mass index is 27 kg/m.  Nutrition Problem: Moderate Malnutrition Etiology: chronic illness (ESRD on HD)    Hypoglycemia - resolved Poor po intake Treated without complication Monitor     DVT prophylaxis: SQ Heparin.  Code Status:  Family Communication:    Currently TOC working on safe Artist.   Signs/Symptoms: mild fat depletion, mild muscle depletion, moderate muscle depletion  Interventions: Refer to RD note for recommendations, Nepro shake, MVI, Prostat  Body mass index is 27.81 kg/m.         Subjective: Doing ok no complaints.     Examination:  General exam: Appears calm and comfortable  Respiratory system: Clear to auscultation. Respiratory effort normal. Cardiovascular system: S1 & S2 heard, RRR. No JVD, murmurs, rubs, gallops or clicks. 2+ b/l LE pitting edema. Gastrointestinal system: Abdomen is nondistended, soft and nontender. No organomegaly or masses felt. Normal bowel sounds heard. Central nervous system: Alert and oriented. No focal neurological deficits. Extremities: Symmetric 5 x 5 power. Skin: No rashes, lesions or ulcers Psychiatry: Judgement and insight appear normal. Mood & affect appropriate.   Left sided avf.   Objective: Vitals:   02/11/22 1350 02/11/22 1628 02/12/22 0500 02/12/22 0531  BP: (!) 166/92 112/75  (!) 152/76  Pulse: 68 73  68  Resp: 16 18  16   Temp:  98.2 F (36.8 C)  98.6 F (37 C)  TempSrc:      SpO2:  99%  100%  Weight:   93 kg   Height:        Intake/Output  Summary (Last 24 hours) at 02/12/2022 0749 Last data filed at 02/11/2022 1314 Gross per 24 hour  Intake 30 ml  Output 3000 ml  Net -2970 ml   Filed Weights   02/11/22 0922 02/11/22 1314 02/12/22 0500  Weight: 94.5 kg 91.9 kg 93 kg     Data Reviewed:   CBC: Recent Labs  Lab 02/10/22 0613  WBC 3.7*  HGB 10.8*  HCT 31.7*  MCV 101.0*  PLT 409   Basic Metabolic Panel: Recent Labs  Lab 02/08/22 0454 02/10/22 0613  NA 133* 133*  K 4.7 5.0  CL 96* 96*  CO2 27 26  GLUCOSE 69* 77  BUN 70* 72*  CREATININE 5.50* 5.72*  CALCIUM 9.1 8.7*   GFR: Estimated Creatinine Clearance: 14.9 mL/min (A) (by C-G formula based on SCr of 5.72 mg/dL (H)). Liver Function Tests: No results for input(s): "AST", "ALT", "ALKPHOS", "BILITOT", "PROT", "ALBUMIN" in the last 168 hours.  No results for input(s): "LIPASE", "AMYLASE" in the last 168 hours. No results for input(s): "AMMONIA" in the last 168 hours. Coagulation Profile: No results for input(s): "INR", "PROTIME" in the last 168 hours. Cardiac Enzymes: No results for input(s): "CKTOTAL", "CKMB", "CKMBINDEX", "TROPONINI" in the last 168 hours. BNP (last 3 results) No results for input(s): "PROBNP" in the last 8760 hours. HbA1C: No results for input(s): "HGBA1C" in the last 72 hours. CBG: Recent Labs  Lab 02/08/22 0738 02/08/22 0939 02/08/22 1013 02/09/22 0507  GLUCAP 70 62* 110* 72   Lipid Profile: No results for input(s): "CHOL", "HDL", "LDLCALC", "TRIG", "CHOLHDL", "LDLDIRECT" in the last 72 hours. Thyroid Function Tests: No results for input(s): "TSH", "T4TOTAL", "FREET4", "T3FREE", "THYROIDAB" in the last 72 hours. Anemia Panel: No results for input(s): "VITAMINB12", "FOLATE", "FERRITIN", "TIBC", "IRON", "RETICCTPCT" in the last 72 hours. Sepsis Labs: No results for input(s): "PROCALCITON", "LATICACIDVEN" in the last 168 hours.  No results found for this or any previous visit (from the past 240 hour(s)).        Radiology Studies: DG Chest 1 View  Result Date: 02/10/2022 CLINICAL DATA:  End-stage renal disease. EXAM: CHEST  1 VIEW COMPARISON:  12/17/2021 FINDINGS: Stable moderate cardiac enlargement. Stable positioning and appearance right-sided tunneled hemodialysis catheter with catheter tip projecting at the SVC/RA junction. Lungs demonstrate chronic component of pulmonary venous hypertension and probable mild chronic pulmonary interstitial edema without overt airspace edema. No airspace consolidation or pneumothorax. Trace left pleural fluid. IMPRESSION: Chronic pulmonary venous hypertension/pulmonary interstitial edema. Trace left pleural fluid. Electronically Signed   By: Aletta Edouard M.D.   On: 02/10/2022 12:16        Scheduled Meds:  (feeding supplement) PROSource Plus  30 mL Oral TID BM   amLODipine  10 mg Oral q morning   calcium acetate  1,334 mg Oral TID WC   carbamazepine  1,000 mg Oral q AM   Chlorhexidine Gluconate Cloth  6 each Topical Daily   cloNIDine  0.2 mg Transdermal Weekly   dextrose  1 Tube Oral Once   doxazosin  4 mg Oral QHS   epoetin (EPOGEN/PROCRIT) injection  4,000 Units Intravenous Q M,W,F-HD   ferrous sulfate  325 mg Oral Q breakfast   fluPHENAZine  2.5 mg Oral q AM   heparin  5,000 Units Subcutaneous Q8H   hydrALAZINE  25 mg Oral TID   irbesartan  300 mg Oral Daily   isosorbide mononitrate  60 mg Oral BID   minoxidil  5 mg Oral Daily   mometasone-formoterol  2 puff Inhalation BID   multivitamin  1 tablet Oral QHS   mouth rinse  15 mL Mouth Rinse 4 times per day   pantoprazole  40 mg Oral Daily   patiromer  16.8 g Oral Daily   polyethylene glycol  17 g Oral BID   trihexyphenidyl  2 mg Oral BID WC   Continuous Infusions:  anticoagulant sodium citrate       LOS: 59 days   Time spent= 35 mins    Ilana Prezioso Arsenio Loader, MD [facility]  If 7PM-7AM, please contact night-coverage  02/12/2022, 7:49 AM

## 2022-02-13 DIAGNOSIS — G9341 Metabolic encephalopathy: Secondary | ICD-10-CM | POA: Diagnosis not present

## 2022-02-13 LAB — RENAL FUNCTION PANEL
Albumin: 3.4 g/dL — ABNORMAL LOW (ref 3.5–5.0)
Anion gap: 15 (ref 5–15)
BUN: 92 mg/dL — ABNORMAL HIGH (ref 8–23)
CO2: 25 mmol/L (ref 22–32)
Calcium: 9.4 mg/dL (ref 8.9–10.3)
Chloride: 92 mmol/L — ABNORMAL LOW (ref 98–111)
Creatinine, Ser: 6.6 mg/dL — ABNORMAL HIGH (ref 0.61–1.24)
GFR, Estimated: 9 mL/min — ABNORMAL LOW (ref >60)
Glucose, Bld: 95 mg/dL (ref 70–99)
Phosphorus: 6.7 mg/dL — ABNORMAL HIGH (ref 2.5–4.6)
Potassium: 5.6 mmol/L — ABNORMAL HIGH (ref 3.5–5.1)
Sodium: 132 mmol/L — ABNORMAL LOW (ref 135–145)

## 2022-02-13 LAB — HEPATITIS B CORE ANTIBODY, TOTAL: Hep B Core Total Ab: NONREACTIVE

## 2022-02-13 LAB — CBC WITH DIFFERENTIAL/PLATELET
Abs Immature Granulocytes: 0.02 10*3/uL (ref 0.00–0.07)
Basophils Absolute: 0 10*3/uL (ref 0.0–0.1)
Basophils Relative: 1 %
Eosinophils Absolute: 0.1 10*3/uL (ref 0.0–0.5)
Eosinophils Relative: 3 %
HCT: 36.6 % — ABNORMAL LOW (ref 39.0–52.0)
Hemoglobin: 12.5 g/dL — ABNORMAL LOW (ref 13.0–17.0)
Immature Granulocytes: 1 %
Lymphocytes Relative: 20 %
Lymphs Abs: 0.7 10*3/uL (ref 0.7–4.0)
MCH: 34.2 pg — ABNORMAL HIGH (ref 26.0–34.0)
MCHC: 34.2 g/dL (ref 30.0–36.0)
MCV: 100.3 fL — ABNORMAL HIGH (ref 80.0–100.0)
Monocytes Absolute: 0.5 10*3/uL (ref 0.1–1.0)
Monocytes Relative: 14 %
Neutro Abs: 2.1 10*3/uL (ref 1.7–7.7)
Neutrophils Relative %: 61 %
Platelets: 175 10*3/uL (ref 150–400)
RBC: 3.65 MIL/uL — ABNORMAL LOW (ref 4.22–5.81)
RDW: 13.4 % (ref 11.5–15.5)
WBC: 3.3 10*3/uL — ABNORMAL LOW (ref 4.0–10.5)
nRBC: 0 % (ref 0.0–0.2)

## 2022-02-13 LAB — HEPATITIS B CORE ANTIBODY, IGM: Hep B C IgM: NONREACTIVE

## 2022-02-13 LAB — HEPATITIS B SURFACE ANTIGEN: Hepatitis B Surface Ag: NONREACTIVE

## 2022-02-13 MED ORDER — EPOETIN ALFA 4000 UNIT/ML IJ SOLN
INTRAMUSCULAR | Status: AC
  Filled 2022-02-13: qty 1

## 2022-02-13 NOTE — Progress Notes (Signed)
Spoke with Teena in admissions regarding bed offer and dialysis times for facility. Loma Boston stated facility dialysis days are MWF at [facility]. Loma Boston stated she was having difficulty accessing her system and would call this CM back.

## 2022-02-13 NOTE — Progress Notes (Signed)
OT Cancellation Note  Patient Details Name: Todd Brown MRN: [id number] DOB: 1960/09/22   Cancelled Treatment:    Reason Eval/Treat Not Completed: Patient at procedure or test/ unavailable. Pt currently out of room for hemodialysis. Will attempt OT session at a later time/date, as pt is available.  Josiah Lobo 02/13/2022, 8:45 AM

## 2022-02-13 NOTE — Progress Notes (Signed)
[facility]  PROGRESS NOTE   Subjective:   Patient sitting up in the chair, taking his meds. No acute complaints Continues to have large amount of lower extremity edema  Patient seen during dialysis Tolerating well    HEMODIALYSIS FLOWSHEET:  Blood Flow Rate (mL/min): 400 mL/min Arterial Pressure (mmHg): -200 mmHg Venous Pressure (mmHg): 250 mmHg TMP (mmHg): 17 mmHg Ultrafiltration Rate (mL/min): 1250 mL/min Dialysate Flow Rate (mL/min): 300 ml/min Dialysis Fluid Bolus: Normal Saline Bolus Amount (mL): 300 mL    Objective:  Vital signs: Blood pressure (!) 179/106, pulse 75, temperature 97.7 F (36.5 C), temperature source Oral, resp. rate 16, height 6' (1.829 m), weight 94.3 kg, SpO2 100 %.  Intake/Output Summary (Last 24 hours) at 02/13/2022 0939 Last data filed at 02/12/2022 1417 Gross per 24 hour  Intake 60 ml  Output --  Net 60 ml    Filed Weights   02/12/22 0500 02/13/22 0535 02/13/22 0838  Weight: 93 kg 94.9 kg 94.3 kg     Physical Exam: General:  No acute distress  Head:  Normocephalic, atraumatic. Moist oral mucosal membranes  Eyes:  Anicteric  Lungs:   Clear to auscultation, normal effort  Heart:  S1S2 no rubs  Abdomen:   Soft, nontender, bowel sounds present  Extremities:  2+ peripheral edema.  Neurologic:  Awake, alert, following commands  Skin:  No lesions  Access: Lt AVF     Basic Metabolic Panel: Recent Labs  Lab 02/08/22 0454 02/10/22 0613 02/13/22 0741  NA 133* 133* 132*  K 4.7 5.0 5.6*  CL 96* 96* 92*  CO2 27 26 25   GLUCOSE 69* 77 95  BUN 70* 72* 92*  CREATININE 5.50* 5.72* 6.60*  CALCIUM 9.1 8.7* 9.4  PHOS  --   --  6.7*     CBC: Recent Labs  Lab 02/10/22 0613 02/13/22 0741  WBC 3.7* 3.3*  NEUTROABS  --  2.1  HGB 10.8* 12.5*  HCT 31.7* 36.6*  MCV 101.0* 100.3*  PLT 152 175      Urinalysis: No results for input(s): "COLORURINE", "LABSPEC", "PHURINE", "GLUCOSEU", "HGBUR", "BILIRUBINUR", "KETONESUR",  "PROTEINUR", "UROBILINOGEN", "NITRITE", "LEUKOCYTESUR" in the last 72 hours.  Invalid input(s): "APPERANCEUR"    Imaging: No results found.   Medications:    anticoagulant sodium citrate      (feeding supplement) PROSource Plus  30 mL Oral TID BM   amLODipine  10 mg Oral q morning   calcium acetate  1,334 mg Oral TID WC   carbamazepine  1,000 mg Oral q AM   Chlorhexidine Gluconate Cloth  6 each Topical Daily   cloNIDine  0.2 mg Transdermal Weekly   dextrose  1 Tube Oral Once   doxazosin  4 mg Oral QHS   epoetin (EPOGEN/PROCRIT) injection  4,000 Units Intravenous Q M,W,F-HD   ferrous sulfate  325 mg Oral Q breakfast   fluPHENAZine  2.5 mg Oral q AM   heparin  5,000 Units Subcutaneous Q8H   hydrALAZINE  25 mg Oral TID   irbesartan  300 mg Oral Daily   isosorbide mononitrate  60 mg Oral BID   minoxidil  2.5 mg Oral Daily   mometasone-formoterol  2 puff Inhalation BID   multivitamin  1 tablet Oral QHS   mouth rinse  15 mL Mouth Rinse 4 times per day   pantoprazole  40 mg Oral Daily   patiromer  16.8 g Oral Daily   polyethylene glycol  17 g Oral BID   trihexyphenidyl  2  mg Oral BID WC    Assessment/ Plan:     Active Problems:   Essential hypertension   ESRD on dialysis [facility])   Mental health disorder   Physical deconditioning   Oral thrush   Malnutrition of moderate degree   Hyperkalemia   Anemia of chronic disease   Hyponatremia  61 year old male with history of hypertension, anemia, schizophrenia, end-stage renal disease on hemodialysis on Monday Wednesday Friday schedule now admitted with history of mental status changes and accelerated hypertension.  [facility] [place]/MWF/Lt AVF   #1: End-stage renal disease on hemodialysis.   About 10 L of fluid has been removed this week with dialysis.  Continue MWF schedule.  Renal navigator monitoring discharge as patient will need an outpatient dialysis clinic at discharge.    #2: Anemia with chronic kidney  disease: Lab Results  Component Value Date   HGB 12.5 (L) 02/13/2022    Continue low-dose EPO with treatments, when hemoglobin is less than 11.   #3: Secondary parathyroidism: Will continue to monitor bone minerals during this admission. Currently receiving calcium acetate with meals. Lab Results  Component Value Date   CALCIUM 9.4 02/13/2022   PHOS 6.7 (H) 02/13/2022      #4: Hypertension with lower extremity edema: Patient has poorly controlled hypertension he has been on minoxidil, isosorbide, irbesartan, amlodipine, clonidine, hydralazine and doxazosin. -This week, we reduced the dose of minoxidil to allow higher blood pressures during dialysis -Volume removal with dialysis as tolerated.  So far 10 L removed this week.   LOS: [facility] 9/15/20239:39 AM

## 2022-02-13 NOTE — Progress Notes (Signed)
Patient completes HD treatment without incident. LUA AVF functions well, no issues. 4 liter fluid removal. Epogen given per MD order. Patient without distress, returned to unit, report given to primary nurse.

## 2022-02-13 NOTE — Plan of Care (Signed)
  Problem: Education: Goal: Knowledge of General Education information will improve Description: Including pain rating scale, medication(s)/side effects and non-pharmacologic comfort measures Outcome: Progressing   Problem: Clinical Measurements: Goal: Ability to maintain clinical measurements within normal limits will improve Outcome: Progressing Goal: Will remain free from infection Outcome: Progressing Goal: Diagnostic test results will improve Outcome: Progressing Goal: Respiratory complications will improve Outcome: Progressing Goal: Cardiovascular complication will be avoided Outcome: Progressing   Problem: Safety: Goal: Ability to remain free from injury will improve Outcome: Progressing   Problem: Skin Integrity: Goal: Risk for impaired skin integrity will decrease Outcome: Progressing

## 2022-02-13 NOTE — Progress Notes (Signed)
Increased patient time to 4 hours and increased goal to 4031ml per MD instruction

## 2022-02-13 NOTE — Progress Notes (Signed)
Occupational Therapy Treatment Patient Details Name: Todd Brown MRN: [id number] DOB: 1960-09-01 Today's Date: 02/13/2022   History of present illness 61 year old male with past medical history of hypertension and end-stage renal disease on hemodialysis presented to the emergency room from his group home/skilled nursing facility on 7/17 with confusion.  (Patient's baseline is that he is unable to care for himself and has a legal guardian.)  Patient last received dialysis on Friday, 7/14.  From being in the emergency room on 7/17, unable to get dialysis yesterday.  Patient found to have markedly elevated blood pressure with a systolic in the 962X.  Patient with questionable cardiac ischemia as well.   OT comments  Todd Brown appeared tired and mildly agitated today but agreeable to participate in OT session. Todd Brown is able to complete seated grooming tasks with Min A following verbal cueing for task initiation. He is able to self-feed following opening of all containers and identification of items on food tray. Significant spillage of all food items. Todd Brown able to wash food off chest and change hospital gown with Mod A and verbal and tactile cueing. Todd Brown performs UE therex at bed level. Anticipate Todd Brown could return to ALF is good support provided, otherwise DC to SNF. Will continue to follow Todd Brown during hospitalization.    Recommendations for follow up therapy are one component of a multi-disciplinary discharge planning process, led by the attending physician.  Recommendations may be updated based on patient status, additional functional criteria and insurance authorization.    Follow Up Recommendations  Skilled nursing-short term rehab (<3 hours/day)    Assistance Recommended at Discharge Frequent or constant Supervision/Assistance  Patient can return home with the following  A lot of help with bathing/dressing/bathroom;A lot of help with walking and/or transfers;Assistance with feeding;Assist for  transportation;Direct supervision/assist for medications management;Direct supervision/assist for financial management;Assistance with cooking/housework;Help with stairs or ramp for entrance   Equipment Recommendations       Recommendations for Other Services      Precautions / Restrictions Precautions Precautions: Fall;Other (comment) Precaution Comments: vision impaired Restrictions Weight Bearing Restrictions: Yes       Mobility Bed Mobility Overal bed mobility: Needs Assistance Bed Mobility: Supine to Sit, Sit to Supine     Supine to sit: Mod assist Sit to supine: Mod assist   General bed mobility comments: tactile and verbal cueing    Transfers                   General transfer comment: Not attempted, meal arrives and Todd Brown requests to eat sitting in bed, is fatigued following HD session earlier today     Balance Overall balance assessment: Needs assistance Sitting-balance support: Feet supported Sitting balance-[name]: Good                                     ADL either performed or assessed with clinical judgement   ADL Overall ADL's : Needs assistance/impaired Eating/Feeding: Set up;Sitting;Supervision/ safety Eating/Feeding Details (indicate cue type and reason): Min A for placement of food container and feeding utensil but then able to complete self-feeding with set up/supervision. Significant food spillage. Grooming: Wash/dry hands;Wash/dry face;Sitting;Set up;Minimal assistance Grooming Details (indicate cue type and reason): verbal cueing to initiate, then Todd Brown able to perform with Min A Upper Body Bathing: Moderate assistance;Bed level       Upper Body Dressing : Moderate assistance Upper Body Dressing Details (indicate cue  type and reason): changing hospital gown. Requires frequent verbal and tactile cues                        Extremity/Trunk Assessment Upper Extremity Assessment Upper Extremity Assessment:  Generalized weakness   Lower Extremity Assessment Lower Extremity Assessment: Generalized weakness        Vision Baseline Vision/History: 2 Legally blind Patient Visual Report: No change from baseline     Perception     Praxis      Cognition Arousal/Alertness: Awake/alert Behavior During Therapy: WFL for tasks assessed/performed Overall Cognitive Status: Within Functional Limits for tasks assessed Area of Impairment: Orientation, Attention, Memory, Following commands, Safety/judgement, Awareness, Problem solving                             Problem Solving: Slow processing, Decreased initiation, Difficulty sequencing, Requires verbal cues, Requires tactile cues General Comments: Todd Brown is oriented to person and place, cooperative throughout session        Exercises Other Exercises Other Exercises: BUE therex in supine: crosslateral punches, elbow flexion/extension, shoulder flexion, w/ verbal and tactile cueing    Shoulder Instructions       General Comments SpO2 95% on 4L [place]    Pertinent Vitals/ Pain       Pain Assessment Pain Assessment: No/denies pain  Home Living                                          Prior Functioning/Environment              Frequency  Min 2X/week        Progress Toward Goals  OT Goals(current goals can now be found in the care plan section)  Progress towards OT goals: Progressing toward goals  Acute Rehab OT Goals OT Goal Formulation: With patient Time For Goal Achievement: 02/18/22 Potential to Achieve Goals: [place] Discharge plan remains appropriate;Frequency remains appropriate    Co-evaluation                 AM-PAC OT "6 Clicks" Daily Activity     Outcome Measure   Help from another person eating meals?: A Little Help from another person taking care of personal grooming?: A Little Help from another person toileting, which includes using toliet, bedpan, or urinal?: A  Lot Help from another person bathing (including washing, rinsing, drying)?: A Lot Help from another person to put on and taking off regular upper body clothing?: A Little Help from another person to put on and taking off regular lower body clothing?: A Lot 6 Click Score: 15    End of Session Equipment Utilized During Treatment: Oxygen  OT Visit Diagnosis: Unsteadiness on feet (R26.81);Muscle weakness (generalized) (M62.81);Feeding difficulties (R63.3)   Activity Tolerance Patient tolerated treatment well   Patient Left in bed;with call bell/phone within reach;with nursing/sitter in room   Nurse Communication          Time: 8502-7741 OT Time Calculation (min): 25 min  Charges: OT General Charges $OT Visit: 1 Visit OT Treatments $Self Care/Home Management : 23-37 mins  Josiah Lobo, PhD, MS, OTR/L 02/13/22, 3:41 PM

## 2022-02-13 NOTE — Progress Notes (Signed)
PT Cancellation Note  Patient Details Name: Todd Brown MRN: [id number] DOB: July 24, 1960   Cancelled Treatment:    Reason Eval/Treat Not Completed: Other (comment) (patient eating lunch after dialysis and requesting to hold on getting out of bed or exercise. PT will continue with attempts)  Todd Brown, PT, MPT  Todd Brown 02/13/2022, 2:54 PM

## 2022-02-13 NOTE — Progress Notes (Signed)
PROGRESS NOTE    Todd Brown  TIR:[id number] DOB: Apr 11, 1961 DOA: 12/15/2021 PCP: Jodi Marble, MD   Brief Narrative:  61 year old male with past medical history of hypertension and end-stage renal disease on hemodialysis presented to the emergency room from his group home/Assisted living facility on 7/17 with confusion.  (Patient's baseline was that he is unable to care for himself and has a legal guardian.)  Patient last received dialysis on Friday, 7/14.  Patient was found to be in hypertensive urgency requiring Cardene drip.  CT head was negative.  During the hospitalization patient's home oral medications were started and nephrology was consulted for dialysis.  Off-and-on patient had some agitation requiring psychiatry evaluation.  He was started on as needed and scheduled Haldol which has improved his mentation.  Hospital course also complicated by periorbital swelling/edema which was treated with steroids.  Currently TOC working on placement.     Assessment & Plan:  Active Problems:   Hyperkalemia   ESRD on dialysis [facility])   Mental health disorder   Essential hypertension   Oral thrush   Physical deconditioning   Malnutrition of moderate degree   Anemia of chronic disease   Hyponatremia    Hyperkalemia -resolved Resolved HD   Hypertensive emergency-resolved as of 12/31/2021 Essential HTN Resolved.  Still has elevated blood pressure but HD should help.  We will avoid aggressively lowering this Current Meds= Norvasc, Cardura, Hydralazine, Avapro, Minoxidil, Clonidine patch   ESRD on dialysis [facility]) HD per Nephrology.    Mental health disorder -stable Unspecified.  It is unclear if he is ever had a formal diagnosis.   Patient is unable to care for himself.   He is for the most part much more calm and cooperative Tegretol and Artane  As needed Haldol for acute behavior agitation   Placement pending.    Oral thrush -resolved Completed course of nystatin   COPD  exacerbation ([place]) - resolved as of 12/18/2021 Monitor O2 w/ VS   Physical deconditioning PT OT = SNF, pending placement.    Anemia of chronic disease Hbg stable. Check CBC ~weekly.   Malnutrition of moderate degree Nutrition Status: Nutrition Problem: Moderate Malnutrition Etiology: chronic illness (ESRD on HD) Signs/Symptoms: mild fat depletion, mild muscle depletion, moderate muscle depletion Interventions: Refer to RD note for recommendations, Nepro shake, MVI, Prostat Body mass index is 27 kg/m.  Nutrition Problem: Moderate Malnutrition Etiology: chronic illness (ESRD on HD)    Hypoglycemia - resolved Poor po intake Treated without complication Monitor     DVT prophylaxis: SQ Heparin.  Code Status:  Family Communication:    Currently TOC working on safe Artist.   Signs/Symptoms: mild fat depletion, mild muscle depletion, moderate muscle depletion  Interventions: Refer to RD note for recommendations, Nepro shake, MVI, Prostat  Body mass index is 28.37 kg/m.         Subjective: Doing ok no complaints.   Examination:  General exam: Appears calm Legally blind.  Respiratory system: Clear to auscultation. Respiratory effort normal. Cardiovascular system: S1 & S2 heard, RRR. No JVD, murmurs, rubs, gallops or clicks. 2-3+ b/l LE pitting edema. Gastrointestinal system: Abdomen is nondistended, soft and nontender. No organomegaly or masses felt. Normal bowel sounds heard. Central nervous system: Alert and oriented. No focal neurological deficits. Extremities: Symmetric 4 x 5 power. Skin: No rashes, lesions or ulcers Psychiatry: Judgement and insight appear normal. Mood & affect appropriate.   Left sided avf.   Objective: Vitals:   02/12/22 0531 02/12/22 2202 02/12/22  2202 02/13/22 0535  BP: (!) 152/76 (!) 144/75 (!) 144/75 (!) 166/86  Pulse: 68  (!) 55 63  Resp: 16   16  Temp: 98.6 F (37 C)   97.7 F (36.5 C)  TempSrc:   Oral Oral  SpO2: 100%   100%   Weight:    94.9 kg  Height:        Intake/Output Summary (Last 24 hours) at 02/13/2022 0741 Last data filed at 02/12/2022 1417 Gross per 24 hour  Intake 60 ml  Output --  Net 60 ml   Filed Weights   02/11/22 1314 02/12/22 0500 02/13/22 0535  Weight: 91.9 kg 93 kg 94.9 kg     Data Reviewed:   CBC: Recent Labs  Lab 02/10/22 0613  WBC 3.7*  HGB 10.8*  HCT 31.7*  MCV 101.0*  PLT 665   Basic Metabolic Panel: Recent Labs  Lab 02/08/22 0454 02/10/22 0613  NA 133* 133*  K 4.7 5.0  CL 96* 96*  CO2 27 26  GLUCOSE 69* 77  BUN 70* 72*  CREATININE 5.50* 5.72*  CALCIUM 9.1 8.7*   GFR: Estimated Creatinine Clearance: 16.2 mL/min (A) (by C-G formula based on SCr of 5.72 mg/dL (H)). Liver Function Tests: No results for input(s): "AST", "ALT", "ALKPHOS", "BILITOT", "PROT", "ALBUMIN" in the last 168 hours.  No results for input(s): "LIPASE", "AMYLASE" in the last 168 hours. No results for input(s): "AMMONIA" in the last 168 hours. Coagulation Profile: No results for input(s): "INR", "PROTIME" in the last 168 hours. Cardiac Enzymes: No results for input(s): "CKTOTAL", "CKMB", "CKMBINDEX", "TROPONINI" in the last 168 hours. BNP (last 3 results) No results for input(s): "PROBNP" in the last 8760 hours. HbA1C: No results for input(s): "HGBA1C" in the last 72 hours. CBG: Recent Labs  Lab 02/08/22 0738 02/08/22 0939 02/08/22 1013 02/09/22 0507  GLUCAP 70 62* 110* 72   Lipid Profile: No results for input(s): "CHOL", "HDL", "LDLCALC", "TRIG", "CHOLHDL", "LDLDIRECT" in the last 72 hours. Thyroid Function Tests: No results for input(s): "TSH", "T4TOTAL", "FREET4", "T3FREE", "THYROIDAB" in the last 72 hours. Anemia Panel: No results for input(s): "VITAMINB12", "FOLATE", "FERRITIN", "TIBC", "IRON", "RETICCTPCT" in the last 72 hours. Sepsis Labs: No results for input(s): "PROCALCITON", "LATICACIDVEN" in the last 168 hours.  No results found for this or any previous visit  (from the past 240 hour(s)).       Radiology Studies: No results found.      Scheduled Meds:  (feeding supplement) PROSource Plus  30 mL Oral TID BM   amLODipine  10 mg Oral q morning   calcium acetate  1,334 mg Oral TID WC   carbamazepine  1,000 mg Oral q AM   Chlorhexidine Gluconate Cloth  6 each Topical Daily   cloNIDine  0.2 mg Transdermal Weekly   dextrose  1 Tube Oral Once   doxazosin  4 mg Oral QHS   epoetin (EPOGEN/PROCRIT) injection  4,000 Units Intravenous Q M,W,F-HD   ferrous sulfate  325 mg Oral Q breakfast   fluPHENAZine  2.5 mg Oral q AM   heparin  5,000 Units Subcutaneous Q8H   hydrALAZINE  25 mg Oral TID   irbesartan  300 mg Oral Daily   isosorbide mononitrate  60 mg Oral BID   minoxidil  2.5 mg Oral Daily   mometasone-formoterol  2 puff Inhalation BID   multivitamin  1 tablet Oral QHS   mouth rinse  15 mL Mouth Rinse 4 times per day   pantoprazole  40  mg Oral Daily   patiromer  16.8 g Oral Daily   polyethylene glycol  17 g Oral BID   trihexyphenidyl  2 mg Oral BID WC   Continuous Infusions:  anticoagulant sodium citrate       LOS: 60 days   Time spent= 35 mins    Del Overfelt Arsenio Loader, MD [facility]  If 7PM-7AM, please contact night-coverage  02/13/2022, 7:41 AM

## 2022-02-14 DIAGNOSIS — G9341 Metabolic encephalopathy: Secondary | ICD-10-CM | POA: Diagnosis not present

## 2022-02-14 LAB — HEPATITIS B SURFACE ANTIBODY, QUANTITATIVE: Hep B S AB Quant (Post): 56.3 m[IU]/mL (ref >9.9)

## 2022-02-14 NOTE — Progress Notes (Signed)
PROGRESS NOTE    Todd Brown  KWI:[id number] DOB: Jan 27, 1961 DOA: 12/15/2021 PCP: Jodi Marble, MD   Brief Narrative:  61 year old male with past medical history of hypertension and end-stage renal disease on hemodialysis presented to the emergency room from his group home/Assisted living facility on 7/17 with confusion.  (Patient's baseline was that he is unable to care for himself and has a legal guardian.)  Patient last received dialysis on Friday, 7/14.  Patient was found to be in hypertensive urgency requiring Cardene drip.  CT head was negative.  During the hospitalization patient's home oral medications were started and nephrology was consulted for dialysis.  Off-and-on patient had some agitation requiring psychiatry evaluation.  He was started on as needed and scheduled Haldol which has improved his mentation.  Hospital course also complicated by periorbital swelling/edema which was treated with steroids.  Currently TOC working on placement.     Assessment & Plan:  Active Problems:   Hyperkalemia   ESRD on dialysis [facility])   Mental health disorder   Essential hypertension   Oral thrush   Physical deconditioning   Malnutrition of moderate degree   Anemia of chronic disease   Hyponatremia    Hyperkalemia -resolved Resolved HD   Hypertensive emergency-resolved as of 12/31/2021 Essential HTN Resolved.  HD is helping.  Current Meds= Norvasc, Cardura, Hydralazine, Avapro, Minoxidil, Clonidine patch   ESRD on dialysis [facility]) HD per Nephrology. Had a session yesterday on 9/17   Mental health disorder -stable Unspecified.  It is unclear if he is ever had a formal diagnosis.   Patient is unable to care for himself.   He is for the most part much more calm and cooperative Tegretol and Artane  As needed Haldol for acute behavior agitation   Placement pending.    Oral thrush -resolved Completed course of nystatin   COPD exacerbation ([place]) - resolved as of  12/18/2021 Monitor O2 w/ VS   Physical deconditioning PT OT = SNF, pending placement.    Anemia of chronic disease Hbg stable. Check CBC ~weekly.   Malnutrition of moderate degree Nutrition Status: Nutrition Problem: Moderate Malnutrition Etiology: chronic illness (ESRD on HD) Signs/Symptoms: mild fat depletion, mild muscle depletion, moderate muscle depletion Interventions: Refer to RD note for recommendations, Nepro shake, MVI, Prostat Body mass index is 27 kg/m.  Nutrition Problem: Moderate Malnutrition Etiology: chronic illness (ESRD on HD)    Hypoglycemia - resolved Poor po intake Treated without complication Monitor     DVT prophylaxis: SQ Heparin.  Code Status:  Family Communication:    Currently TOC working on safe Artist.   Signs/Symptoms: mild fat depletion, mild muscle depletion, moderate muscle depletion  Interventions: Refer to RD note for recommendations, Nepro shake, MVI, Prostat  Body mass index is 27.18 kg/m.    Subjective: Sitting up in the chair, no complaints.   Examination:  General exam: Appears calm Legally blind.  Respiratory system: Clear to auscultation. Respiratory effort normal. Cardiovascular system: S1 & S2 heard, RRR. No JVD, murmurs, rubs, gallops or clicks. 2+ b/l LE pitting edema. Gastrointestinal system: Abdomen is nondistended, soft and nontender. No organomegaly or masses felt. Normal bowel sounds heard. Central nervous system: Alert and oriented. No focal neurological deficits. Extremities: Symmetric 4 x 5 power. Skin: No rashes, lesions or ulcers Psychiatry: Judgement and insight appear normal. Mood & affect appropriate.   Left sided avf.   Objective: Vitals:   02/13/22 1349 02/13/22 2109 02/14/22 0500 02/14/22 0502  BP: 136/81 139/74  105/62  Pulse: 70   73  Resp: 16   18  Temp: 98.1 F (36.7 C)   98.4 F (36.9 C)  TempSrc:      SpO2: 100%   96%  Weight:   90.9 kg   Height:        Intake/Output Summary  (Last 24 hours) at 02/14/2022 0916 Last data filed at 02/13/2022 1900 Gross per 24 hour  Intake 240 ml  Output 4000 ml  Net -3760 ml   Filed Weights   02/13/22 0838 02/13/22 1330 02/14/22 0500  Weight: 94.3 kg 90.9 kg 90.9 kg     Data Reviewed:   CBC: Recent Labs  Lab 02/10/22 0613 02/13/22 0741  WBC 3.7* 3.3*  NEUTROABS  --  2.1  HGB 10.8* 12.5*  HCT 31.7* 36.6*  MCV 101.0* 100.3*  PLT 152 638   Basic Metabolic Panel: Recent Labs  Lab 02/08/22 0454 02/10/22 0613 02/13/22 0741  NA 133* 133* 132*  K 4.7 5.0 5.6*  CL 96* 96* 92*  CO2 27 26 25   GLUCOSE 69* 77 95  BUN 70* 72* 92*  CREATININE 5.50* 5.72* 6.60*  CALCIUM 9.1 8.7* 9.4  PHOS  --   --  6.7*   GFR: Estimated Creatinine Clearance: 12.9 mL/min (A) (by C-G formula based on SCr of 6.6 mg/dL (H)). Liver Function Tests: Recent Labs  Lab 02/13/22 0741  ALBUMIN 3.4*    No results for input(s): "LIPASE", "AMYLASE" in the last 168 hours. No results for input(s): "AMMONIA" in the last 168 hours. Coagulation Profile: No results for input(s): "INR", "PROTIME" in the last 168 hours. Cardiac Enzymes: No results for input(s): "CKTOTAL", "CKMB", "CKMBINDEX", "TROPONINI" in the last 168 hours. BNP (last 3 results) No results for input(s): "PROBNP" in the last 8760 hours. HbA1C: No results for input(s): "HGBA1C" in the last 72 hours. CBG: Recent Labs  Lab 02/08/22 0738 02/08/22 0939 02/08/22 1013 02/09/22 0507  GLUCAP 70 62* 110* 72   Lipid Profile: No results for input(s): "CHOL", "HDL", "LDLCALC", "TRIG", "CHOLHDL", "LDLDIRECT" in the last 72 hours. Thyroid Function Tests: No results for input(s): "TSH", "T4TOTAL", "FREET4", "T3FREE", "THYROIDAB" in the last 72 hours. Anemia Panel: No results for input(s): "VITAMINB12", "FOLATE", "FERRITIN", "TIBC", "IRON", "RETICCTPCT" in the last 72 hours. Sepsis Labs: No results for input(s): "PROCALCITON", "LATICACIDVEN" in the last 168 hours.  No results found  for this or any previous visit (from the past 240 hour(s)).       Radiology Studies: No results found.      Scheduled Meds:  (feeding supplement) PROSource Plus  30 mL Oral TID BM   amLODipine  10 mg Oral q morning   calcium acetate  1,334 mg Oral TID WC   carbamazepine  1,000 mg Oral q AM   Chlorhexidine Gluconate Cloth  6 each Topical Daily   cloNIDine  0.2 mg Transdermal Weekly   dextrose  1 Tube Oral Once   doxazosin  4 mg Oral QHS   ferrous sulfate  325 mg Oral Q breakfast   fluPHENAZine  2.5 mg Oral q AM   heparin  5,000 Units Subcutaneous Q8H   hydrALAZINE  25 mg Oral TID   irbesartan  300 mg Oral Daily   isosorbide mononitrate  60 mg Oral BID   minoxidil  2.5 mg Oral Daily   mometasone-formoterol  2 puff Inhalation BID   multivitamin  1 tablet Oral QHS   mouth rinse  15 mL Mouth Rinse 4 times per day  pantoprazole  40 mg Oral Daily   patiromer  16.8 g Oral Daily   polyethylene glycol  17 g Oral BID   trihexyphenidyl  2 mg Oral BID WC   Continuous Infusions:  anticoagulant sodium citrate       LOS: 61 days   Time spent= 35 mins    Terrie Grajales Arsenio Loader, MD [facility]  If 7PM-7AM, please contact night-coverage  02/14/2022, 9:16 AM

## 2022-02-14 NOTE — Progress Notes (Signed)
[facility]  PROGRESS NOTE   Subjective:   Patient sitting up in the chair, taking his meds. No acute complaints Continues to have large amount of lower extremity edema  Patient seen sitting up in chair, completing breakfast Denies pain or discomfort Lower extremity edema remains but slightly improved   Objective:  Vital signs: Blood pressure (!) 151/75, pulse 62, temperature 98.4 F (36.9 C), resp. rate 17, height 6' (1.829 m), weight 90.9 kg, SpO2 100 %.  Intake/Output Summary (Last 24 hours) at 02/14/2022 1158 Last data filed at 02/13/2022 1900 Gross per 24 hour  Intake 240 ml  Output 4000 ml  Net -3760 ml    Filed Weights   02/13/22 0838 02/13/22 1330 02/14/22 0500  Weight: 94.3 kg 90.9 kg 90.9 kg     Physical Exam: General:  No acute distress  Head:  Normocephalic, atraumatic. Moist oral mucosal membranes  Eyes:  Anicteric  Lungs:   Clear to auscultation, normal effort  Heart:  S1S2 no rubs  Abdomen:   Soft, nontender, bowel sounds present  Extremities:  2+ peripheral edema.  Neurologic:  Awake, alert, following commands  Skin:  No lesions  Access: Lt AVF     Basic Metabolic Panel: Recent Labs  Lab 02/08/22 0454 02/10/22 0613 02/13/22 0741  NA 133* 133* 132*  K 4.7 5.0 5.6*  CL 96* 96* 92*  CO2 27 26 25   GLUCOSE 69* 77 95  BUN 70* 72* 92*  CREATININE 5.50* 5.72* 6.60*  CALCIUM 9.1 8.7* 9.4  PHOS  --   --  6.7*     CBC: Recent Labs  Lab 02/10/22 0613 02/13/22 0741  WBC 3.7* 3.3*  NEUTROABS  --  2.1  HGB 10.8* 12.5*  HCT 31.7* 36.6*  MCV 101.0* 100.3*  PLT 152 175      Urinalysis: No results for input(s): "COLORURINE", "LABSPEC", "PHURINE", "GLUCOSEU", "HGBUR", "BILIRUBINUR", "KETONESUR", "PROTEINUR", "UROBILINOGEN", "NITRITE", "LEUKOCYTESUR" in the last 72 hours.  Invalid input(s): "APPERANCEUR"    Imaging: No results found.   Medications:    anticoagulant sodium citrate      (feeding supplement) PROSource  Plus  30 mL Oral TID BM   amLODipine  10 mg Oral q morning   calcium acetate  1,334 mg Oral TID WC   carbamazepine  1,000 mg Oral q AM   Chlorhexidine Gluconate Cloth  6 each Topical Daily   cloNIDine  0.2 mg Transdermal Weekly   dextrose  1 Tube Oral Once   doxazosin  4 mg Oral QHS   ferrous sulfate  325 mg Oral Q breakfast   fluPHENAZine  2.5 mg Oral q AM   heparin  5,000 Units Subcutaneous Q8H   hydrALAZINE  25 mg Oral TID   irbesartan  300 mg Oral Daily   isosorbide mononitrate  60 mg Oral BID   minoxidil  2.5 mg Oral Daily   mometasone-formoterol  2 puff Inhalation BID   multivitamin  1 tablet Oral QHS   mouth rinse  15 mL Mouth Rinse 4 times per day   pantoprazole  40 mg Oral Daily   patiromer  16.8 g Oral Daily   polyethylene glycol  17 g Oral BID   trihexyphenidyl  2 mg Oral BID WC    Assessment/ Plan:     Active Problems:   Essential hypertension   ESRD on dialysis [facility])   Mental health disorder   Physical deconditioning   Oral thrush   Malnutrition of moderate degree   Hyperkalemia  Anemia of chronic disease   Hyponatremia  61 year old male with history of hypertension, anemia, schizophrenia, end-stage renal disease on hemodialysis on Monday Wednesday Friday schedule now admitted with history of mental status changes and accelerated hypertension.  [facility] [place]/MWF/Lt AVF   #1: End-stage renal disease on hemodialysis.   Dialysis received yesterday, UF goal 4 L achieved.  Next treatment scheduled for Monday.  We will continue to offer extra treatment weekly to help manage fluid status.  Renal navigator monitoring discharge as patient will need an outpatient dialysis clinic at discharge.    #2: Anemia with chronic kidney disease: Lab Results  Component Value Date   HGB 12.5 (L) 02/13/2022    Hemoglobin within desired target.  We will hold EPO at this time.   #3: Secondary parathyroidism: Calcium within desired target however phosphorus remains  elevated.  Continue calcium acetate with meals. Lab Results  Component Value Date   CALCIUM 9.4 02/13/2022   PHOS 6.7 (H) 02/13/2022      #4: Hypertension with lower extremity edema: Patient has poorly controlled hypertension he has been on minoxidil, isosorbide, irbesartan, amlodipine, clonidine, hydralazine and doxazosin. -Adjustments were made with minoxidil earlier this week to allow on increased blood pressure with dialysis to facilitate increase fluid removal.  We will continue to monitor blood pressure and make adjustments as needed.   LOS: [facility] 9/16/202311:58 AM

## 2022-02-15 DIAGNOSIS — G9341 Metabolic encephalopathy: Secondary | ICD-10-CM | POA: Diagnosis not present

## 2022-02-15 NOTE — Progress Notes (Signed)
[facility]  PROGRESS NOTE   Subjective:   Patient seen sitting up in bed, complaining couple coffee Alert and oriented Denies any pain or discomfort Lower extremity edema slowly improving  Dialysis scheduled for tomorrow   Objective:  Vital signs: Blood pressure (!) 169/80, pulse 63, temperature 98.1 F (36.7 C), resp. rate 17, height 6' (1.829 m), weight 92.8 kg, SpO2 99 %.  Intake/Output Summary (Last 24 hours) at 02/15/2022 1057 Last data filed at 02/15/2022 0900 Gross per 24 hour  Intake 160 ml  Output --  Net 160 ml    Filed Weights   02/13/22 1330 02/14/22 0500 02/15/22 0500  Weight: 90.9 kg 90.9 kg 92.8 kg     Physical Exam: General:  No acute distress  Head:  Normocephalic, atraumatic. Moist oral mucosal membranes  Eyes:  Anicteric  Lungs:   Clear to auscultation, normal effort  Heart:  S1S2 no rubs  Abdomen:   Soft, nontender, bowel sounds present  Extremities:  2+ peripheral edema.  Neurologic:  Awake, alert, following commands  Skin:  No lesions  Access: Lt AVF     Basic Metabolic Panel: Recent Labs  Lab 02/10/22 0613 02/13/22 0741  NA 133* 132*  K 5.0 5.6*  CL 96* 92*  CO2 26 25  GLUCOSE 77 95  BUN 72* 92*  CREATININE 5.72* 6.60*  CALCIUM 8.7* 9.4  PHOS  --  6.7*     CBC: Recent Labs  Lab 02/10/22 0613 02/13/22 0741  WBC 3.7* 3.3*  NEUTROABS  --  2.1  HGB 10.8* 12.5*  HCT 31.7* 36.6*  MCV 101.0* 100.3*  PLT 152 175      Urinalysis: No results for input(s): "COLORURINE", "LABSPEC", "PHURINE", "GLUCOSEU", "HGBUR", "BILIRUBINUR", "KETONESUR", "PROTEINUR", "UROBILINOGEN", "NITRITE", "LEUKOCYTESUR" in the last 72 hours.  Invalid input(s): "APPERANCEUR"    Imaging: No results found.   Medications:    anticoagulant sodium citrate      (feeding supplement) PROSource Plus  30 mL Oral TID BM   amLODipine  10 mg Oral q morning   calcium acetate  1,334 mg Oral TID WC   carbamazepine  1,000 mg Oral q AM    Chlorhexidine Gluconate Cloth  6 each Topical Daily   cloNIDine  0.2 mg Transdermal Weekly   dextrose  1 Tube Oral Once   doxazosin  4 mg Oral QHS   ferrous sulfate  325 mg Oral Q breakfast   fluPHENAZine  2.5 mg Oral q AM   heparin  5,000 Units Subcutaneous Q8H   hydrALAZINE  25 mg Oral TID   irbesartan  300 mg Oral Daily   isosorbide mononitrate  60 mg Oral BID   minoxidil  2.5 mg Oral Daily   mometasone-formoterol  2 puff Inhalation BID   multivitamin  1 tablet Oral QHS   mouth rinse  15 mL Mouth Rinse 4 times per day   pantoprazole  40 mg Oral Daily   patiromer  16.8 g Oral Daily   polyethylene glycol  17 g Oral BID   trihexyphenidyl  2 mg Oral BID WC    Assessment/ Plan:     Active Problems:   Essential hypertension   ESRD on dialysis [facility])   Mental health disorder   Physical deconditioning   Oral thrush   Malnutrition of moderate degree   Hyperkalemia   Anemia of chronic disease   Hyponatremia  61 year old male with history of hypertension, anemia, schizophrenia, end-stage renal disease on hemodialysis on Monday Wednesday Friday schedule now  admitted with history of mental status changes and accelerated hypertension.  [facility] [place]/MWF/Lt AVF   #1: End-stage renal disease on hemodialysis.   Patient scheduled for dialysis tomorrow.  UF goal 3.5 to 4 L as tolerated.  We will continue to offer additional treatment a week as needed.  Patient continues to require rehab at discharge.  Monitoring placement to assess changes in outpatient dialysis needs.   #2: Anemia with chronic kidney disease: Lab Results  Component Value Date   HGB 12.5 (L) 02/13/2022    Hemoglobin stable.  No need for ESA at this time.   #3: Secondary parathyroidism: We will recheck labs in AM.  Continue calcium acetate with meals as phosphorus remains elevated. Lab Results  Component Value Date   CALCIUM 9.4 02/13/2022   PHOS 6.7 (H) 02/13/2022      #4: Hypertension with lower extremity  edema: Patient has poorly controlled hypertension he has been on minoxidil, isosorbide, irbesartan, amlodipine, clonidine, hydralazine and doxazosin. -Blood pressure elevated this morning 169/80.   LOS: [facility] 9/17/202310:57 AM

## 2022-02-15 NOTE — Progress Notes (Signed)
PROGRESS NOTE    Todd Brown  POE:[id number] DOB: [birth year] DOA: 12/15/2021 PCP: Jodi Marble, MD   Brief Narrative:  61 year old male with past medical history of hypertension and end-stage renal disease on hemodialysis presented to the emergency room from his group home/Assisted living facility on 7/17 with confusion.  (Patient's baseline was that he is unable to care for himself and has a legal guardian.)  Patient last received dialysis on Friday, 7/14.  Patient was found to be in hypertensive urgency requiring Cardene drip.  CT head was negative.  During the hospitalization patient's home oral medications were started and nephrology was consulted for dialysis.  Off-and-on patient had some agitation requiring psychiatry evaluation.  He was started on as needed and scheduled Haldol which has improved his mentation.  Hospital course also complicated by periorbital swelling/edema which was treated with steroids.  Currently TOC working on placement.     Assessment & Plan:  Active Problems:   Hyperkalemia   ESRD on dialysis [facility])   Mental health disorder   Essential hypertension   Oral thrush   Physical deconditioning   Malnutrition of moderate degree   Anemia of chronic disease   Hyponatremia    Hyperkalemia -resolved Resolved HD   Hypertensive emergency-resolved as of 12/31/2021 Essential HTN Resolved.  HD is helping.  Current Meds= Norvasc, Cardura, Hydralazine, Avapro, Minoxidil, Clonidine patch   ESRD on dialysis [facility]) HD per Nephrology. Had a session yesterday on 9/17. Next HD on 9/18   Mental health disorder -stable Unspecified.  It is unclear if he is ever had a formal diagnosis.   Patient is unable to care for himself.   He is for the most part much more calm and cooperative Tegretol and Artane  As needed Haldol for acute behavior agitation   Placement pending.    Oral thrush -resolved Completed course of nystatin   COPD exacerbation ([place]) - resolved as  of 12/18/2021 Monitor O2 w/ VS   Physical deconditioning PT OT = SNF, pending placement.    Anemia of chronic disease Hbg stable. Check CBC ~weekly.   Malnutrition of moderate degree Nutrition Status: Nutrition Problem: Moderate Malnutrition Etiology: chronic illness (ESRD on HD) Signs/Symptoms: mild fat depletion, mild muscle depletion, moderate muscle depletion Interventions: Refer to RD note for recommendations, Nepro shake, MVI, Prostat Body mass index is 27 kg/m.  Nutrition Problem: Moderate Malnutrition Etiology: chronic illness (ESRD on HD)    Hypoglycemia - resolved Poor po intake Treated without complication Monitor     DVT prophylaxis: SQ Heparin.  Code Status:  Family Communication:     TOC working on safe DISPO  Signs/Symptoms: mild fat depletion, mild muscle depletion, moderate muscle depletion  Interventions: Refer to RD note for recommendations, Nepro shake, MVI, Prostat  Body mass index is 27.75 kg/m.    Subjective: Laying in the bed, no complaints.   Examination: Constitutional: Not in acute distress; legally blind.  Respiratory: Clear to auscultation bilaterally Cardiovascular: Normal sinus rhythm, no rubs Abdomen: Nontender nondistended good bowel sounds Musculoskeletal: 1-2+ b/l pitting LE edema noted. Strenght 4/5 in all ext.  Skin: No rashes seen Neurologic: CN 2-12 grossly intact.  And nonfocal. Speech is chronically unclear.  Psychiatric: Normal judgment and insight. Alert and oriented x 3. Mood is at baseline.   Left sided avf.   Objective: Vitals:   02/14/22 0922 02/14/22 1648 02/15/22 0500 02/15/22 0503  BP: (!) 151/75 124/68  (!) 161/84  Pulse: 62 64  75  Resp: 17 18  18  Temp:  98.1 F (36.7 C)  98.1 F (36.7 C)  TempSrc:  Oral    SpO2: 100% 95%  93%  Weight:   92.8 kg   Height:        Intake/Output Summary (Last 24 hours) at 02/15/2022 0816 Last data filed at 02/14/2022 1300 Gross per 24 hour  Intake 40 ml  Output  --  Net 40 ml   Filed Weights   02/13/22 1330 02/14/22 0500 02/15/22 0500  Weight: 90.9 kg 90.9 kg 92.8 kg     Data Reviewed:   CBC: Recent Labs  Lab 02/10/22 0613 02/13/22 0741  WBC 3.7* 3.3*  NEUTROABS  --  2.1  HGB 10.8* 12.5*  HCT 31.7* 36.6*  MCV 101.0* 100.3*  PLT 152 824   Basic Metabolic Panel: Recent Labs  Lab 02/10/22 0613 02/13/22 0741  NA 133* 132*  K 5.0 5.6*  CL 96* 92*  CO2 26 25  GLUCOSE 77 95  BUN 72* 92*  CREATININE 5.72* 6.60*  CALCIUM 8.7* 9.4  PHOS  --  6.7*   GFR: Estimated Creatinine Clearance: 12.9 mL/min (A) (by C-G formula based on SCr of 6.6 mg/dL (H)). Liver Function Tests: Recent Labs  Lab 02/13/22 0741  ALBUMIN 3.4*    No results for input(s): "LIPASE", "AMYLASE" in the last 168 hours. No results for input(s): "AMMONIA" in the last 168 hours. Coagulation Profile: No results for input(s): "INR", "PROTIME" in the last 168 hours. Cardiac Enzymes: No results for input(s): "CKTOTAL", "CKMB", "CKMBINDEX", "TROPONINI" in the last 168 hours. BNP (last 3 results) No results for input(s): "PROBNP" in the last 8760 hours. HbA1C: No results for input(s): "HGBA1C" in the last 72 hours. CBG: Recent Labs  Lab 02/08/22 0939 02/08/22 1013 02/09/22 0507  GLUCAP 62* 110* 72   Lipid Profile: No results for input(s): "CHOL", "HDL", "LDLCALC", "TRIG", "CHOLHDL", "LDLDIRECT" in the last 72 hours. Thyroid Function Tests: No results for input(s): "TSH", "T4TOTAL", "FREET4", "T3FREE", "THYROIDAB" in the last 72 hours. Anemia Panel: No results for input(s): "VITAMINB12", "FOLATE", "FERRITIN", "TIBC", "IRON", "RETICCTPCT" in the last 72 hours. Sepsis Labs: No results for input(s): "PROCALCITON", "LATICACIDVEN" in the last 168 hours.  No results found for this or any previous visit (from the past 240 hour(s)).       Radiology Studies: No results found.      Scheduled Meds:  (feeding supplement) PROSource Plus  30 mL Oral TID BM    amLODipine  10 mg Oral q morning   calcium acetate  1,334 mg Oral TID WC   carbamazepine  1,000 mg Oral q AM   Chlorhexidine Gluconate Cloth  6 each Topical Daily   cloNIDine  0.2 mg Transdermal Weekly   dextrose  1 Tube Oral Once   doxazosin  4 mg Oral QHS   ferrous sulfate  325 mg Oral Q breakfast   fluPHENAZine  2.5 mg Oral q AM   heparin  5,000 Units Subcutaneous Q8H   hydrALAZINE  25 mg Oral TID   irbesartan  300 mg Oral Daily   isosorbide mononitrate  60 mg Oral BID   minoxidil  2.5 mg Oral Daily   mometasone-formoterol  2 puff Inhalation BID   multivitamin  1 tablet Oral QHS   mouth rinse  15 mL Mouth Rinse 4 times per day   pantoprazole  40 mg Oral Daily   patiromer  16.8 g Oral Daily   polyethylene glycol  17 g Oral BID   trihexyphenidyl  2 mg  Oral BID WC   Continuous Infusions:  anticoagulant sodium citrate       LOS: 62 days   Time spent= 35 mins    Todd Brown Arsenio Loader, MD [facility]  If 7PM-7AM, please contact night-coverage  02/15/2022, 8:16 AM

## 2022-02-16 DIAGNOSIS — G9341 Metabolic encephalopathy: Secondary | ICD-10-CM | POA: Diagnosis not present

## 2022-02-16 LAB — RENAL FUNCTION PANEL
Albumin: 3.3 g/dL — ABNORMAL LOW (ref 3.5–5.0)
Anion gap: 16 — ABNORMAL HIGH (ref 5–15)
BUN: 99 mg/dL — ABNORMAL HIGH (ref 8–23)
CO2: 24 mmol/L (ref 22–32)
Calcium: 9.3 mg/dL (ref 8.9–10.3)
Chloride: 93 mmol/L — ABNORMAL LOW (ref 98–111)
Creatinine, Ser: 7.27 mg/dL — ABNORMAL HIGH (ref 0.61–1.24)
GFR, Estimated: 8 mL/min — ABNORMAL LOW (ref >60)
Glucose, Bld: 85 mg/dL (ref 70–99)
Phosphorus: 6 mg/dL — ABNORMAL HIGH (ref 2.5–4.6)
Potassium: 5.9 mmol/L — ABNORMAL HIGH (ref 3.5–5.1)
Sodium: 133 mmol/L — ABNORMAL LOW (ref 135–145)

## 2022-02-16 LAB — CBC
HCT: 31.4 % — ABNORMAL LOW (ref 39.0–52.0)
Hemoglobin: 10.5 g/dL — ABNORMAL LOW (ref 13.0–17.0)
MCH: 33.7 pg (ref 26.0–34.0)
MCHC: 33.4 g/dL (ref 30.0–36.0)
MCV: 100.6 fL — ABNORMAL HIGH (ref 80.0–100.0)
Platelets: 169 10*3/uL (ref 150–400)
RBC: 3.12 MIL/uL — ABNORMAL LOW (ref 4.22–5.81)
RDW: 13.3 % (ref 11.5–15.5)
WBC: 4.7 10*3/uL (ref 4.0–10.5)
nRBC: 0 % (ref 0.0–0.2)

## 2022-02-16 NOTE — Progress Notes (Signed)
PROGRESS NOTE    Todd Brown  LFY:[id number] DOB: 1961/01/27 DOA: 12/15/2021 PCP: Jodi Marble, MD   Brief Narrative:  61 year old male with past medical history of hypertension and end-stage renal disease on hemodialysis presented to the emergency room from his group home/Assisted living facility on 7/17 with confusion.  (Patient's baseline was that he is unable to care for himself and has a legal guardian.)  Patient last received dialysis on Friday, 7/14.  Patient was found to be in hypertensive urgency requiring Cardene drip.  CT head was negative.  During the hospitalization patient's home oral medications were started and nephrology was consulted for dialysis.  Off-and-on patient had some agitation requiring psychiatry evaluation.  He was started on as needed and scheduled Haldol which has improved his mentation.  Hospital course also complicated by periorbital swelling/edema which was treated with steroids.  Currently TOC working on placement.     Assessment & Plan:  Active Problems:   Hyperkalemia   ESRD on dialysis [facility])   Mental health disorder   Essential hypertension   Oral thrush   Physical deconditioning   Malnutrition of moderate degree   Anemia of chronic disease   Hyponatremia    Hyperkalemia -resolved Resolved HD   Hypertensive emergency-resolved as of 12/31/2021 Essential HTN Resolved.  HD today Current Meds= Norvasc, Cardura, Hydralazine, Avapro, Minoxidil, Clonidine patch   ESRD on dialysis [facility]) HD per Nephrology. Had a session yesterday on 9/17 HD today   Mental health disorder -stable Unspecified.  It is unclear if he is ever had a formal diagnosis.   Patient is unable to care for himself.   He is for the most part much more calm and cooperative Tegretol and Artane  As needed Haldol for acute behavior agitation   Placement pending.    Oral thrush -resolved Completed course of nystatin   COPD exacerbation ([place]) - resolved as of  12/18/2021 Monitor O2 w/ VS   Physical deconditioning PT OT = SNF, pending placement.    Anemia of chronic disease Hbg stable. Check CBC ~weekly.   Malnutrition of moderate degree Nutrition Status: Nutrition Problem: Moderate Malnutrition Etiology: chronic illness (ESRD on HD) Signs/Symptoms: mild fat depletion, mild muscle depletion, moderate muscle depletion Interventions: Refer to RD note for recommendations, Nepro shake, MVI, Prostat Body mass index is 27 kg/m.  Nutrition Problem: Moderate Malnutrition Etiology: chronic illness (ESRD on HD)    Hypoglycemia - resolved Poor po intake Treated without complication Monitor     DVT prophylaxis: SQ Heparin.  Code Status:  Family Communication:     TOC working on safe DISPO  Signs/Symptoms: mild fat depletion, mild muscle depletion, moderate muscle depletion  Interventions: Refer to RD note for recommendations, Nepro shake, MVI, Prostat  Body mass index is 25.09 kg/m.    Subjective: No complaints.   Examination: Constitutional: Not in acute distress. Chronically ill Respiratory: Clear to auscultation bilaterally Cardiovascular: Normal sinus rhythm, no rubs Abdomen: Nontender nondistended good bowel sounds Musculoskeletal: 1/2+ edema noted. Strehg 4/5 Skin: No rashes seen Neurologic: CN 2-12 grossly intact.  And nonfocal Psychiatric: Normal judgment and insight. Alert and oriented x 3. Normal mood.     Left sided avf.   Objective: Vitals:   02/16/22 1000 02/16/22 1030 02/16/22 1100 02/16/22 1130  BP: (!) 155/79 (!) 178/79 (!) 180/80 (!) 162/79  Pulse: (!) 56 (!) 58 (!) 58 (!) 56  Resp: 16 17 17 12   Temp:    97.9 F (36.6 C)  TempSrc:    Oral  SpO2: 100% 100% 100% 100%  Weight:    83.9 kg  Height:        Intake/Output Summary (Last 24 hours) at 02/16/2022 1211 Last data filed at 02/16/2022 1130 Gross per 24 hour  Intake 120 ml  Output 4000 ml  Net -3880 ml   Filed Weights   02/16/22 0500  02/16/22 0727 02/16/22 1130  Weight: 94 kg 92.6 kg 83.9 kg     Data Reviewed:   CBC: Recent Labs  Lab 02/10/22 0613 02/13/22 0741 02/16/22 0432  WBC 3.7* 3.3* 4.7  NEUTROABS  --  2.1  --   HGB 10.8* 12.5* 10.5*  HCT 31.7* 36.6* 31.4*  MCV 101.0* 100.3* 100.6*  PLT 152 175 161   Basic Metabolic Panel: Recent Labs  Lab 02/10/22 0613 02/13/22 0741 02/16/22 0432  NA 133* 132* 133*  K 5.0 5.6* 5.9*  CL 96* 92* 93*  CO2 26 25 24   GLUCOSE 77 95 85  BUN 72* 92* 99*  CREATININE 5.72* 6.60* 7.27*  CALCIUM 8.7* 9.4 9.3  PHOS  --  6.7* 6.0*   GFR: Estimated Creatinine Clearance: 11.7 mL/min (A) (by C-G formula based on SCr of 7.27 mg/dL (H)). Liver Function Tests: Recent Labs  Lab 02/13/22 0741 02/16/22 0432  ALBUMIN 3.4* 3.3*    No results for input(s): "LIPASE", "AMYLASE" in the last 168 hours. No results for input(s): "AMMONIA" in the last 168 hours. Coagulation Profile: No results for input(s): "INR", "PROTIME" in the last 168 hours. Cardiac Enzymes: No results for input(s): "CKTOTAL", "CKMB", "CKMBINDEX", "TROPONINI" in the last 168 hours. BNP (last 3 results) No results for input(s): "PROBNP" in the last 8760 hours. HbA1C: No results for input(s): "HGBA1C" in the last 72 hours. CBG: No results for input(s): "GLUCAP" in the last 168 hours.  Lipid Profile: No results for input(s): "CHOL", "HDL", "LDLCALC", "TRIG", "CHOLHDL", "LDLDIRECT" in the last 72 hours. Thyroid Function Tests: No results for input(s): "TSH", "T4TOTAL", "FREET4", "T3FREE", "THYROIDAB" in the last 72 hours. Anemia Panel: No results for input(s): "VITAMINB12", "FOLATE", "FERRITIN", "TIBC", "IRON", "RETICCTPCT" in the last 72 hours. Sepsis Labs: No results for input(s): "PROCALCITON", "LATICACIDVEN" in the last 168 hours.  No results found for this or any previous visit (from the past 240 hour(s)).       Radiology Studies: No results found.      Scheduled Meds:  (feeding  supplement) PROSource Plus  30 mL Oral TID BM   amLODipine  10 mg Oral q morning   calcium acetate  1,334 mg Oral TID WC   carbamazepine  1,000 mg Oral q AM   Chlorhexidine Gluconate Cloth  6 each Topical Daily   cloNIDine  0.2 mg Transdermal Weekly   dextrose  1 Tube Oral Once   doxazosin  4 mg Oral QHS   ferrous sulfate  325 mg Oral Q breakfast   fluPHENAZine  2.5 mg Oral q AM   heparin  5,000 Units Subcutaneous Q8H   hydrALAZINE  25 mg Oral TID   irbesartan  300 mg Oral Daily   isosorbide mononitrate  60 mg Oral BID   minoxidil  2.5 mg Oral Daily   mometasone-formoterol  2 puff Inhalation BID   multivitamin  1 tablet Oral QHS   mouth rinse  15 mL Mouth Rinse 4 times per day   pantoprazole  40 mg Oral Daily   patiromer  16.8 g Oral Daily   polyethylene glycol  17 g Oral BID   trihexyphenidyl  2  mg Oral BID WC   Continuous Infusions:  anticoagulant sodium citrate       LOS: 63 days   Time spent= 35 mins    Christie Copley Arsenio Loader, MD [facility]  If 7PM-7AM, please contact night-coverage  02/16/2022, 12:11 PM

## 2022-02-16 NOTE — Progress Notes (Signed)
Pt 3.5 hour HD treatment complete w/ no complications. Alert, no c/o, vss, report to primary RN. Start: 9597 End: 1130 4052ml fluid removed 84L BVP 83.9kg post bed weight No meds ordered w/ HD

## 2022-02-16 NOTE — Progress Notes (Signed)
Beginning search for dialysis center in [place]. Referral sent to [place]. Per Todd Brown place requires a MWF shift. This could be a barrier for placement, as most [facility] clinics do not have a MWF slot open.

## 2022-02-16 NOTE — Progress Notes (Signed)
Post HD RN assessment 

## 2022-02-16 NOTE — Progress Notes (Signed)
[facility]  PROGRESS NOTE   Subjective:   Patient seen and evaluated during dialysis   HEMODIALYSIS FLOWSHEET:  Blood Flow Rate (mL/min): 400 mL/min Arterial Pressure (mmHg): -170 mmHg Venous Pressure (mmHg): 250 mmHg TMP (mmHg): 11 mmHg Ultrafiltration Rate (mL/min): 1339 mL/min Dialysate Flow Rate (mL/min): 300 ml/min Dialysis Fluid Bolus: Normal Saline Bolus Amount (mL): 300 mL  No complaints at this time, tolerating treatment well   Objective:  Vital signs: Blood pressure (!) 162/79, pulse (!) 56, temperature 97.9 F (36.6 C), temperature source Oral, resp. rate 12, height 6' (1.829 m), weight 83.9 kg, SpO2 100 %.  Intake/Output Summary (Last 24 hours) at 02/16/2022 1305 Last data filed at 02/16/2022 1130 Gross per 24 hour  Intake 120 ml  Output 4000 ml  Net -3880 ml    Filed Weights   02/16/22 0500 02/16/22 0727 02/16/22 1130  Weight: 94 kg 92.6 kg 83.9 kg     Physical Exam: General:  No acute distress  Head:  Normocephalic, atraumatic. Moist oral mucosal membranes  Eyes:  Anicteric  Lungs:   Clear to auscultation, normal effort  Heart:  S1S2 no rubs  Abdomen:   Soft, nontender, bowel sounds present  Extremities:  2+ peripheral edema.  Neurologic:  Awake, alert, following commands  Skin:  No lesions  Access: Lt AVF     Basic Metabolic Panel: Recent Labs  Lab 02/10/22 0613 02/13/22 0741 02/16/22 0432  NA 133* 132* 133*  K 5.0 5.6* 5.9*  CL 96* 92* 93*  CO2 26 25 24   GLUCOSE 77 95 85  BUN 72* 92* 99*  CREATININE 5.72* 6.60* 7.27*  CALCIUM 8.7* 9.4 9.3  PHOS  --  6.7* 6.0*     CBC: Recent Labs  Lab 02/10/22 0613 02/13/22 0741 02/16/22 0432  WBC 3.7* 3.3* 4.7  NEUTROABS  --  2.1  --   HGB 10.8* 12.5* 10.5*  HCT 31.7* 36.6* 31.4*  MCV 101.0* 100.3* 100.6*  PLT 152 175 169      Urinalysis: No results for input(s): "COLORURINE", "LABSPEC", "PHURINE", "GLUCOSEU", "HGBUR", "BILIRUBINUR", "KETONESUR", "PROTEINUR",  "UROBILINOGEN", "NITRITE", "LEUKOCYTESUR" in the last 72 hours.  Invalid input(s): "APPERANCEUR"    Imaging: No results found.   Medications:    anticoagulant sodium citrate      (feeding supplement) PROSource Plus  30 mL Oral TID BM   amLODipine  10 mg Oral q morning   calcium acetate  1,334 mg Oral TID WC   carbamazepine  1,000 mg Oral q AM   Chlorhexidine Gluconate Cloth  6 each Topical Daily   cloNIDine  0.2 mg Transdermal Weekly   dextrose  1 Tube Oral Once   doxazosin  4 mg Oral QHS   ferrous sulfate  325 mg Oral Q breakfast   fluPHENAZine  2.5 mg Oral q AM   heparin  5,000 Units Subcutaneous Q8H   hydrALAZINE  25 mg Oral TID   irbesartan  300 mg Oral Daily   isosorbide mononitrate  60 mg Oral BID   minoxidil  2.5 mg Oral Daily   mometasone-formoterol  2 puff Inhalation BID   multivitamin  1 tablet Oral QHS   mouth rinse  15 mL Mouth Rinse 4 times per day   pantoprazole  40 mg Oral Daily   patiromer  16.8 g Oral Daily   polyethylene glycol  17 g Oral BID   trihexyphenidyl  2 mg Oral BID WC    Assessment/ Plan:     Active Problems:  Essential hypertension   ESRD on dialysis [facility])   Mental health disorder   Physical deconditioning   Oral thrush   Malnutrition of moderate degree   Hyperkalemia   Anemia of chronic disease   Hyponatremia  61 year old male with history of hypertension, anemia, schizophrenia, end-stage renal disease on hemodialysis on Monday Wednesday Friday schedule now admitted with history of mental status changes and accelerated hypertension.  [facility] [place]/MWF/Lt AVF   #1: End-stage renal disease on hemodialysis.   Patient receiving scheduled treatment today, UF goal 3.5 to 4 L.  Next treatment scheduled for Wednesday.  We will consider extra treatment this week if needed.  Patient received bed offer from rehab in [place], renal navigator currently seeking dialysis clinic transfer.   #2: Anemia with chronic kidney disease: Lab  Results  Component Value Date   HGB 10.5 (L) 02/16/2022    Hemoglobin remains stable.  No need for ESA's at this time.   #3: Secondary parathyroidism: Calcium remains at goal however phosphorus elevated but improving.  Continue calcium acetate with meals. Lab Results  Component Value Date   CALCIUM 9.3 02/16/2022   PHOS 6.0 (H) 02/16/2022      #4: Hypertension with lower extremity edema: Patient has poorly controlled hypertension he has been on minoxidil, isosorbide, irbesartan, amlodipine, clonidine, hydralazine and doxazosin. -Blood pressure 155/79 during dialysis.   LOS: [facility] 9/18/20231:05 PM

## 2022-02-16 NOTE — Progress Notes (Signed)
Pre HD RN assessment 

## 2022-02-16 NOTE — Progress Notes (Signed)
Retrieved a message from [facility] in admissions at [place] requesting initial  referral be resent in [facility]. Referral sent as requested.

## 2022-02-17 DIAGNOSIS — G9341 Metabolic encephalopathy: Secondary | ICD-10-CM | POA: Diagnosis not present

## 2022-02-17 NOTE — Progress Notes (Signed)
[facility]  PROGRESS NOTE   Subjective:   Patient seen resting comfortably Alert Denies pain and discomfort   Objective:  Vital signs: Blood pressure (!) 156/80, pulse (!) 58, temperature 98.1 F (36.7 C), resp. rate 14, height 6' (1.829 m), weight 83.9 kg, SpO2 100 %.  Intake/Output Summary (Last 24 hours) at 02/17/2022 1054 Last data filed at 02/16/2022 1130 Gross per 24 hour  Intake --  Output 4000 ml  Net -4000 ml    Filed Weights   02/16/22 0500 02/16/22 0727 02/16/22 1130  Weight: 94 kg 92.6 kg 83.9 kg     Physical Exam: General:  No acute distress  Head:  Normocephalic, atraumatic. Moist oral mucosal membranes  Eyes:  Anicteric  Lungs:   Clear to auscultation, normal effort  Heart:  S1S2 no rubs  Abdomen:   Soft, nontender, bowel sounds present  Extremities:  2+ peripheral edema.  Neurologic:  Awake, alert, following commands  Skin:  No lesions  Access: Lt AVF     Basic Metabolic Panel: Recent Labs  Lab 02/13/22 0741 02/16/22 0432  NA 132* 133*  K 5.6* 5.9*  CL 92* 93*  CO2 25 24  GLUCOSE 95 85  BUN 92* 99*  CREATININE 6.60* 7.27*  CALCIUM 9.4 9.3  PHOS 6.7* 6.0*     CBC: Recent Labs  Lab 02/13/22 0741 02/16/22 0432  WBC 3.3* 4.7  NEUTROABS 2.1  --   HGB 12.5* 10.5*  HCT 36.6* 31.4*  MCV 100.3* 100.6*  PLT 175 169      Urinalysis: No results for input(s): "COLORURINE", "LABSPEC", "PHURINE", "GLUCOSEU", "HGBUR", "BILIRUBINUR", "KETONESUR", "PROTEINUR", "UROBILINOGEN", "NITRITE", "LEUKOCYTESUR" in the last 72 hours.  Invalid input(s): "APPERANCEUR"    Imaging: No results found.   Medications:    anticoagulant sodium citrate      (feeding supplement) PROSource Plus  30 mL Oral TID BM   amLODipine  10 mg Oral q morning   calcium acetate  1,334 mg Oral TID WC   carbamazepine  1,000 mg Oral q AM   Chlorhexidine Gluconate Cloth  6 each Topical Daily   cloNIDine  0.2 mg Transdermal Weekly   dextrose  1 Tube Oral  Once   doxazosin  4 mg Oral QHS   ferrous sulfate  325 mg Oral Q breakfast   fluPHENAZine  2.5 mg Oral q AM   heparin  5,000 Units Subcutaneous Q8H   hydrALAZINE  25 mg Oral TID   irbesartan  300 mg Oral Daily   isosorbide mononitrate  60 mg Oral BID   minoxidil  2.5 mg Oral Daily   mometasone-formoterol  2 puff Inhalation BID   multivitamin  1 tablet Oral QHS   mouth rinse  15 mL Mouth Rinse 4 times per day   pantoprazole  40 mg Oral Daily   patiromer  16.8 g Oral Daily   polyethylene glycol  17 g Oral BID   trihexyphenidyl  2 mg Oral BID WC    Assessment/ Plan:     Active Problems:   Essential hypertension   ESRD on dialysis [facility])   Mental health disorder   Physical deconditioning   Oral thrush   Malnutrition of moderate degree   Hyperkalemia   Anemia of chronic disease   Hyponatremia  61 year old male with history of hypertension, anemia, schizophrenia, end-stage renal disease on hemodialysis on Monday Wednesday Friday schedule now admitted with history of mental status changes and accelerated hypertension.  [facility] [place]/MWF/Lt AVF   #1: End-stage  renal disease on hemodialysis.   Received dialysis yesterday with UF 4L Next treatment scheduled for Wednesday. Will plan additional treatment for Thursday.   Rehab arranged in [place], awaiting dialysis acceptance.    #2: Anemia with chronic kidney disease: Lab Results  Component Value Date   HGB 10.5 (L) 02/16/2022    Hemoglobin at goal   #3: Secondary parathyroidism: Will continue to monitor bone minerals during this admission. Continue calcium acetate with meals. Lab Results  Component Value Date   CALCIUM 9.3 02/16/2022   PHOS 6.0 (H) 02/16/2022      #4: Hypertension with lower extremity edema: Patient has poorly controlled hypertension he has been on minoxidil, isosorbide, irbesartan, amlodipine, clonidine, hydralazine and doxazosin. -Blood pressure stable today   LOS: [facility] 9/19/202310:54 AM

## 2022-02-17 NOTE — Progress Notes (Signed)
Physical Therapy Treatment Patient Details Name: Todd Brown MRN: [id number] DOB: 1961-04-14 Today's Date: 02/17/2022   History of Present Illness 61 year old male with past medical history of hypertension and end-stage renal disease on hemodialysis presented to the emergency room from his group home/skilled nursing facility on 7/17 with confusion.  (Patient's baseline is that he is unable to care for himself and has a legal guardian.)  Patient last received dialysis on Friday, 7/14.  From being in the emergency room on 7/17, unable to get dialysis yesterday.  Patient found to have markedly elevated blood pressure with a systolic in the 786V.  Patient with questionable cardiac ischemia as well.    PT Comments    Pt received in Semi-Fowler's position and agreeable to therapy.  Pt continuous asking for potato chips and coffee throughout the session, with pt being educated on fluid restrictions at this time.  Pt participated in bed-level exercises prior to attempting to ambulate.  Pt much better with bed mobility and is making good progress on strength.  Pt able to side-step at EOB for safety and has some difficulty with foot placement likely due to lack of vision.  Pt then transferred back to bed and was assisted with chuck pad change in preparation for nursing to clean pt.  Pt left with nursing staff in room and all needs met and call bell within reach.  Current discharge plans to SNF remain appropriate at this time.  Pt will continue to benefit from skilled therapy in order to address deficits listed below.      Recommendations for follow up therapy are one component of a multi-disciplinary discharge planning process, led by the attending physician.  Recommendations may be updated based on patient status, additional functional criteria and insurance authorization.  Follow Up Recommendations  Skilled nursing-short term rehab (<3 hours/day) Can patient physically be transported by private vehicle:  Yes   Assistance Recommended at Discharge Intermittent Supervision/Assistance  Patient can return home with the following A little help with walking and/or transfers;A little help with bathing/dressing/bathroom;Direct supervision/assist for medications management;Help with stairs or ramp for entrance;Assist for transportation;Assistance with feeding;Assistance with cooking/housework;Direct supervision/assist for financial management   Equipment Recommendations  Rolling walker (2 wheels);BSC/3in1;Wheelchair cushion (measurements PT);Wheelchair (measurements PT)    Recommendations for Other Services       Precautions / Restrictions Precautions Precautions: Fall;Other (comment) Precaution Comments: vision impaired Restrictions Weight Bearing Restrictions: Yes RLE Weight Bearing: Weight bearing as tolerated LLE Weight Bearing: Weight bearing as tolerated     Mobility  Bed Mobility Overal bed mobility: Needs Assistance Bed Mobility: Supine to Sit, Sit to Supine     Supine to sit: Min guard Sit to supine: Min guard   General bed mobility comments: tactile and verbal cueing    Transfers Overall transfer level: Needs assistance Equipment used: Rolling walker (2 wheels) Transfers: Sit to/from Stand Sit to Stand: Supervision                Ambulation/Gait Ambulation/Gait assistance: Min guard Gait Distance (Feet): 5 Feet Assistive device: Rolling walker (2 wheels)   Gait velocity: decreased     General Gait Details: Pt side-stepped at EOB to the foot of the bed and then back to the head of the bed x2 times.   Stairs             Wheelchair Mobility    Modified Rankin (Stroke Patients Only)       Balance Overall balance assessment: Needs assistance Sitting-balance support: Feet supported  Sitting balance-[name]: Good                                      Cognition Arousal/Alertness: Awake/alert Behavior During Therapy: WFL for  tasks assessed/performed Overall Cognitive Status: Within Functional Limits for tasks assessed Area of Impairment: Orientation, Attention, Memory, Following commands, Safety/judgement, Awareness, Problem solving                 Orientation Level: Disoriented to, Time Current Attention Level: Sustained Memory: Decreased short-term memory Following Commands: Follows one step commands with increased time Safety/Judgement: Decreased awareness of deficits, Decreased awareness of safety Awareness: Intellectual Problem Solving: Slow processing, Decreased initiation, Difficulty sequencing, Requires verbal cues, Requires tactile cues General Comments: Pt is oriented to person and place, cooperative throughout session        Exercises Total Joint Exercises Ankle Circles/Pumps: AROM, Strengthening, Both, 10 reps, Supine Quad Sets: AROM, Strengthening, Both, 10 reps, Supine Gluteal Sets: AROM, Strengthening, Both, 10 reps, Supine Hip ABduction/ADduction: AROM, Strengthening, Both, 10 reps, Supine Straight Leg Raises: AROM, Strengthening, Both, 10 reps, Supine Long Arc Quad: AROM, Strengthening, Both, 10 reps, Seated Marching in Standing: AROM, Strengthening, Both, 5 reps, Standing    General Comments        Pertinent Vitals/Pain Pain Assessment Pain Assessment: No/denies pain    Home Living                          Prior Function            PT Goals (current goals can now be found in the care plan section) Acute Rehab PT Goals Patient Stated Goal: leave the hospital PT Goal Formulation: With patient Time For Goal Achievement: 02/21/22 Potential to Achieve Goals: Fair Progress towards PT goals: Progressing toward goals    Frequency    Min 2X/week      PT Plan Current plan remains appropriate    Co-evaluation              AM-PAC PT "6 Clicks" Mobility   Outcome Measure  Help needed turning from your back to your side while in a flat bed without  using bedrails?: None Help needed moving from lying on your back to sitting on the side of a flat bed without using bedrails?: A Little Help needed moving to and from a bed to a chair (including a wheelchair)?: A Little Help needed standing up from a chair using your arms (e.g., wheelchair or bedside chair)?: A Little Help needed to walk in hospital room?: A Little Help needed climbing 3-5 steps with a railing? : A Lot 6 Click Score: 18    End of Session Equipment Utilized During Treatment: Gait belt;Oxygen Activity Tolerance: Patient tolerated treatment well Patient left: with nursing/sitter in room;in bed;with bed alarm set Nurse Communication: Mobility status PT Visit Diagnosis: Other abnormalities of gait and mobility (R26.89);Muscle weakness (generalized) (M62.81);Unsteadiness on feet (R26.81)     Time: 3893-7342 PT Time Calculation (min) (ACUTE ONLY): 32 min  Charges:  $Therapeutic Exercise: 8-22 mins $Therapeutic Activity: 8-22 mins                     Gwenlyn Saran, PT, DPT 02/17/22, 3:28 PM

## 2022-02-17 NOTE — Progress Notes (Signed)
PROGRESS NOTE    Todd Brown  NFA:[id number] DOB: 06-13-1960 DOA: 12/15/2021 PCP: Jodi Marble, MD   Brief Narrative:  61 year old male with past medical history of hypertension and end-stage renal disease on hemodialysis presented to the emergency room from his group home/Assisted living facility on 7/17 with confusion.  (Patient's baseline was that he is unable to care for himself and has a legal guardian.)  Patient last received dialysis on Friday, 7/14.  Patient was found to be in hypertensive urgency requiring Cardene drip.  CT head was negative.  During the hospitalization patient's home oral medications were started and nephrology was consulted for dialysis.  Off-and-on patient had some agitation requiring psychiatry evaluation.  He was started on as needed and scheduled Haldol which has improved his mentation.  Hospital course also complicated by periorbital swelling/edema which was treated with steroids.  All week no issues for now, toc working placement. Tolerating HD.      Assessment & Plan:  Active Problems:   Hyperkalemia   ESRD on dialysis [facility])   Mental health disorder   Essential hypertension   Oral thrush   Physical deconditioning   Malnutrition of moderate degree   Anemia of chronic disease   Hyponatremia    Hyperkalemia -resolved Resolved HD   Hypertensive emergency-resolved as of 12/31/2021 Essential HTN Resolved.  S/p HD yesterday.  Current Meds= Norvasc, Cardura, Hydralazine, Avapro, Minoxidil, Clonidine patch   ESRD on dialysis [facility]) HD per Nephrology. S/p HD yesterday   Mental health disorder -stable Unspecified.  It is unclear if he is ever had a formal diagnosis.   Patient is unable to care for himself.   He is for the most part much more calm and cooperative Tegretol and Artane  As needed Haldol for acute behavior agitation   Placement pending.    Oral thrush -resolved Completed course of nystatin   COPD exacerbation ([place]) - resolved  as of 12/18/2021 Monitor O2 w/ VS   Physical deconditioning PT OT = SNF, pending placement.    Anemia of chronic disease Hbg stable. Check CBC ~weekly.   Malnutrition of moderate degree Nutrition Status: Nutrition Problem: Moderate Malnutrition Etiology: chronic illness (ESRD on HD) Signs/Symptoms: mild fat depletion, mild muscle depletion, moderate muscle depletion Interventions: Refer to RD note for recommendations, Nepro shake, MVI, Prostat Body mass index is 27 kg/m.  Nutrition Problem: Moderate Malnutrition Etiology: chronic illness (ESRD on HD)    Hypoglycemia - resolved Poor po intake Treated without complication Monitor     DVT prophylaxis: SQ Heparin.  Code Status: Full Family Communication:     TOC working on safe DISPO  Signs/Symptoms: mild fat depletion, mild muscle depletion, moderate muscle depletion  Interventions: Refer to RD note for recommendations, Nepro shake, MVI, Prostat  Body mass index is 25.09 kg/m.    Subjective: Feeding well, no complaints.    Examination: Constitutional: Not in acute distress, chronically ill. Blind.  Respiratory: Clear to auscultation bilaterally Cardiovascular: Normal sinus rhythm, no rubs Abdomen: Nontender nondistended good bowel sounds Musculoskeletal: N2+ b/l LE pitting edema noted Skin: No rashes seen Neurologic: CN 2-12 grossly intact.  And nonfocal Psychiatric: Normal judgment and insight. Alert and oriented x 3.     Objective: Vitals:   02/16/22 2206 02/17/22 0557 02/17/22 0730 02/17/22 0731  BP: 107/69 (!) 146/75 (!) 156/80 (!) 156/80  Pulse: 67 (!) 59 (!) 59 (!) 58  Resp:  18 14   Temp: 98.8 F (37.1 C) 98 F (36.7 C) 98.5 F (36.9 C)  98.1 F (36.7 C)  TempSrc:      SpO2: 97% 100% 100% 100%  Weight:      Height:        Intake/Output Summary (Last 24 hours) at 02/17/2022 1123 Last data filed at 02/16/2022 1130 Gross per 24 hour  Intake --  Output 4000 ml  Net -4000 ml   Filed Weights    02/16/22 0500 02/16/22 0727 02/16/22 1130  Weight: 94 kg 92.6 kg 83.9 kg     Data Reviewed:   CBC: Recent Labs  Lab 02/13/22 0741 02/16/22 0432  WBC 3.3* 4.7  NEUTROABS 2.1  --   HGB 12.5* 10.5*  HCT 36.6* 31.4*  MCV 100.3* 100.6*  PLT 175 242   Basic Metabolic Panel: Recent Labs  Lab 02/13/22 0741 02/16/22 0432  NA 132* 133*  K 5.6* 5.9*  CL 92* 93*  CO2 25 24  GLUCOSE 95 85  BUN 92* 99*  CREATININE 6.60* 7.27*  CALCIUM 9.4 9.3  PHOS 6.7* 6.0*   GFR: Estimated Creatinine Clearance: 11.7 mL/min (A) (by C-G formula based on SCr of 7.27 mg/dL (H)). Liver Function Tests: Recent Labs  Lab 02/13/22 0741 02/16/22 0432  ALBUMIN 3.4* 3.3*    No results for input(s): "LIPASE", "AMYLASE" in the last 168 hours. No results for input(s): "AMMONIA" in the last 168 hours. Coagulation Profile: No results for input(s): "INR", "PROTIME" in the last 168 hours. Cardiac Enzymes: No results for input(s): "CKTOTAL", "CKMB", "CKMBINDEX", "TROPONINI" in the last 168 hours. BNP (last 3 results) No results for input(s): "PROBNP" in the last 8760 hours. HbA1C: No results for input(s): "HGBA1C" in the last 72 hours. CBG: No results for input(s): "GLUCAP" in the last 168 hours.  Lipid Profile: No results for input(s): "CHOL", "HDL", "LDLCALC", "TRIG", "CHOLHDL", "LDLDIRECT" in the last 72 hours. Thyroid Function Tests: No results for input(s): "TSH", "T4TOTAL", "FREET4", "T3FREE", "THYROIDAB" in the last 72 hours. Anemia Panel: No results for input(s): "VITAMINB12", "FOLATE", "FERRITIN", "TIBC", "IRON", "RETICCTPCT" in the last 72 hours. Sepsis Labs: No results for input(s): "PROCALCITON", "LATICACIDVEN" in the last 168 hours.  No results found for this or any previous visit (from the past 240 hour(s)).       Radiology Studies: No results found.      Scheduled Meds:  (feeding supplement) PROSource Plus  30 mL Oral TID BM   amLODipine  10 mg Oral q morning    calcium acetate  1,334 mg Oral TID WC   carbamazepine  1,000 mg Oral q AM   Chlorhexidine Gluconate Cloth  6 each Topical Daily   cloNIDine  0.2 mg Transdermal Weekly   dextrose  1 Tube Oral Once   doxazosin  4 mg Oral QHS   ferrous sulfate  325 mg Oral Q breakfast   fluPHENAZine  2.5 mg Oral q AM   heparin  5,000 Units Subcutaneous Q8H   hydrALAZINE  25 mg Oral TID   irbesartan  300 mg Oral Daily   isosorbide mononitrate  60 mg Oral BID   minoxidil  2.5 mg Oral Daily   mometasone-formoterol  2 puff Inhalation BID   multivitamin  1 tablet Oral QHS   mouth rinse  15 mL Mouth Rinse 4 times per day   pantoprazole  40 mg Oral Daily   patiromer  16.8 g Oral Daily   polyethylene glycol  17 g Oral BID   trihexyphenidyl  2 mg Oral BID WC   Continuous Infusions:  anticoagulant sodium citrate  LOS: 64 days   Time spent= 35 mins    Keoni Havey Arsenio Loader, MD [facility]  If 7PM-7AM, please contact night-coverage  02/17/2022, 11:23 AM

## 2022-02-17 NOTE — TOC Progression Note (Signed)
Transition of Care [facility]) - Progression Note    Patient Details  Name: Todd Brown MRN: [id number] Date of Birth: Jun 10, 1960  Transition of Care [facility]) CM/SW Contact  Laurena Slimmer, RN Phone Number: 02/17/2022, 4:13 PM  Clinical Narrative:    Call placed to [facility] to confirm discharge plan. Receptionist stated admissions coordinator, Loma Boston was not in the office. Will reattempt at a later time.    Expected Discharge Plan: [place] Barriers to Discharge: Continued Medical Work up  Expected Discharge Plan and Services Expected Discharge Plan: [place] arrangements for the past 2 months: [place] Expected Discharge Date: 12/19/21                                     Social Determinants of Health (SDOH) Interventions    Readmission Risk Interventions     No data to display

## 2022-02-18 DIAGNOSIS — E875 Hyperkalemia: Secondary | ICD-10-CM | POA: Diagnosis not present

## 2022-02-18 DIAGNOSIS — Z992 Dependence on renal dialysis: Secondary | ICD-10-CM | POA: Diagnosis not present

## 2022-02-18 DIAGNOSIS — N186 End stage renal disease: Secondary | ICD-10-CM | POA: Diagnosis not present

## 2022-02-18 DIAGNOSIS — G9341 Metabolic encephalopathy: Secondary | ICD-10-CM | POA: Diagnosis not present

## 2022-02-18 LAB — BASIC METABOLIC PANEL
Anion gap: 12 (ref 5–15)
BUN: 97 mg/dL — ABNORMAL HIGH (ref 8–23)
CO2: 26 mmol/L (ref 22–32)
Calcium: 9 mg/dL (ref 8.9–10.3)
Chloride: 95 mmol/L — ABNORMAL LOW (ref 98–111)
Creatinine, Ser: 7.19 mg/dL — ABNORMAL HIGH (ref 0.61–1.24)
GFR, Estimated: 8 mL/min — ABNORMAL LOW (ref >60)
Glucose, Bld: 87 mg/dL (ref 70–99)
Potassium: 5.6 mmol/L — ABNORMAL HIGH (ref 3.5–5.1)
Sodium: 133 mmol/L — ABNORMAL LOW (ref 135–145)

## 2022-02-18 LAB — MAGNESIUM: Magnesium: 1.7 mg/dL (ref 1.7–2.4)

## 2022-02-18 MED ORDER — SODIUM POLYSTYRENE SULFONATE 15 GM/60ML PO SUSP
30.0000 g | Freq: Every day | ORAL | Status: DC
  Administered 2022-02-18 – 2022-02-22 (×3): 30 g via ORAL
  Filled 2022-02-18 (×3): qty 120

## 2022-02-18 NOTE — Progress Notes (Signed)
  Progress Note   Patient: Todd Brown NOB:[id number] DOB: 01-14-1961 DOA: 12/15/2021     65 DOS: the patient was seen and examined on 02/18/2022   Brief hospital course: 61 year old male with past medical history of hypertension and end-stage renal disease on hemodialysis presented to the emergency room from his group home/Assisted living facility on 7/17 with confusion.  (Patient's baseline was that he is unable to care for himself and has a legal guardian.)  Patient last received dialysis on Friday, 7/14.    Patient found to have markedly elevated blood pressure with a systolic in the 947M requiring Cardene drip and placement of patient to the ICU. Nephrology consulted and resumed patient's dialysis.  Over the next few weeks, blood pressure better controlled.  Patient was also started on as needed Haldol for agitation.  Hospital course complicated by episode of periorbital swelling bilaterally felt to be secondary to subcutaneous emphysema, resolved on steroids.  Also had episode of oral thrush treated with Magic mouthwash.    Has been medically stable for several weeks at this point. Patient's assisted living facility willing to take patient back after becomes much stronger at skilled nursing facility.  Skilled nursing facility search has been in progress, with no bed offers thus far.      Assessment and Plan: Hyperkalemia -resolved Hypertensive emergency-resolved as of 12/31/2021 Essential HTN ESRD on dialysis ([place]) Hyponatremia. Patient is a followed by nephrology, receiving dialysis as scheduled.  Patient still has significant hyperkalemia.  Also on scheduled Lokelma daily.  It does not seem to be working.  I will discontinue Lokelma, changed to Kayexalate.  Also discontinue scheduled MiraLAX.   Mental health disorder -stable Continue home medicines.     Oral thrush -resolved Improved.   COPD exacerbation ([place]) - resolved as of 12/18/2021 Improved.   Physical  deconditioning Pending SNF placement.   Anemia of chronic disease Hbg stable.    Malnutrition of moderate degree Continue protein supplement.    Hypoglycemia - resolved Condition better.      Subjective:  Patient is confused, but seem to be at baseline.  No agitation.  Legally blind.  Physical Exam: Vitals:   02/18/22 1100 02/18/22 1130 02/18/22 1138 02/18/22 1215  BP: (!) 166/81 (!) 168/80 (!) 171/83 (!) 172/80  Pulse: (!) 57 (!) 58 (!) 58 62  Resp: 11 10 10 16   Temp:   98 F (36.7 C) 97.6 F (36.4 C)  TempSrc:   Oral   SpO2: 100% 99% 100% 93%  Weight:      Height:       General exam: Appears calm and comfortable  Respiratory system: Clear to auscultation. Respiratory effort normal. Cardiovascular system: S1 & S2 heard, RRR. No JVD, murmurs, rubs, gallops or clicks. No pedal edema. Gastrointestinal system: Abdomen is nondistended, soft and nontender. No organomegaly or masses felt. Normal bowel sounds heard. Central nervous system: Alert and oriented x1. No focal neurological deficits. Extremities: Symmetric 5 x 5 power. Skin: No rashes, lesions or ulcers   Data Reviewed:  Lab results reviewed.  Family Communication: None  Disposition: Status is: Inpatient Remains inpatient appropriate because: Severity of disease, unsafe discharge.  Planned Discharge Destination: Skilled nursing facility    Time spent: 35 minutes  Author: Sharen Hones, MD 02/18/2022 3:14 PM  For on call review [email or web address].

## 2022-02-18 NOTE — Progress Notes (Signed)
OT Cancellation Note  Patient Details Name: Todd Brown MRN: [id number] DOB: [birth year]   Cancelled Treatment:    Reason Eval/Treat Not Completed: Patient at procedure or test/ unavailable. Pt is currently off the unit for HD. OT will re-attempt as time allows.   Darleen Crocker, MS, OTR/L , CBIS ascom [phone]  02/18/22, 10:50 AM

## 2022-02-18 NOTE — Progress Notes (Signed)
Assumed responsibility for patient from P. [place], HD RN. Handoff complete. Safety maintained.

## 2022-02-18 NOTE — Progress Notes (Signed)
[facility]  PROGRESS NOTE   Subjective:   Patient seen and evaluated during dialysis   HEMODIALYSIS FLOWSHEET:  Blood Flow Rate (mL/min): 400 mL/min Arterial Pressure (mmHg): -150 mmHg Venous Pressure (mmHg): 330 mmHg TMP (mmHg): 15 mmHg Ultrafiltration Rate (mL/min): 1343 mL/min Dialysate Flow Rate (mL/min): 300 ml/min Dialysis Fluid Bolus: Normal Saline Bolus Amount (mL): 300 mL  No complaints at this time   Objective:  Vital signs: Blood pressure (!) 171/83, pulse (!) 58, temperature 98 F (36.7 C), temperature source Oral, resp. rate 10, height 6' (1.829 m), weight 89.6 kg, SpO2 100 %.  Intake/Output Summary (Last 24 hours) at 02/18/2022 1158 Last data filed at 02/18/2022 1138 Gross per 24 hour  Intake 240 ml  Output 4000 ml  Net -3760 ml    Filed Weights   02/16/22 0727 02/16/22 1130 02/18/22 0752  Weight: 92.6 kg 83.9 kg 89.6 kg     Physical Exam: General:  No acute distress  Head:  Normocephalic, atraumatic. Moist oral mucosal membranes  Eyes:  Anicteric  Lungs:   Clear to auscultation, normal effort  Heart:  S1S2 no rubs  Abdomen:   Soft, nontender, bowel sounds present  Extremities:  2+ peripheral edema.  Neurologic:  Awake, alert, following commands  Skin:  No lesions  Access: Lt AVF     Basic Metabolic Panel: Recent Labs  Lab 02/13/22 0741 02/16/22 0432 02/18/22 0458  NA 132* 133* 133*  K 5.6* 5.9* 5.6*  CL 92* 93* 95*  CO2 25 24 26   GLUCOSE 95 85 87  BUN 92* 99* 97*  CREATININE 6.60* 7.27* 7.19*  CALCIUM 9.4 9.3 9.0  MG  --   --  1.7  PHOS 6.7* 6.0*  --      CBC: Recent Labs  Lab 02/13/22 0741 02/16/22 0432  WBC 3.3* 4.7  NEUTROABS 2.1  --   HGB 12.5* 10.5*  HCT 36.6* 31.4*  MCV 100.3* 100.6*  PLT 175 169      Urinalysis: No results for input(s): "COLORURINE", "LABSPEC", "PHURINE", "GLUCOSEU", "HGBUR", "BILIRUBINUR", "KETONESUR", "PROTEINUR", "UROBILINOGEN", "NITRITE", "LEUKOCYTESUR" in the last 72  hours.  Invalid input(s): "APPERANCEUR"    Imaging: No results found.   Medications:    anticoagulant sodium citrate      (feeding supplement) PROSource Plus  30 mL Oral TID BM   amLODipine  10 mg Oral q morning   calcium acetate  1,334 mg Oral TID WC   carbamazepine  1,000 mg Oral q AM   Chlorhexidine Gluconate Cloth  6 each Topical Daily   cloNIDine  0.2 mg Transdermal Weekly   dextrose  1 Tube Oral Once   doxazosin  4 mg Oral QHS   ferrous sulfate  325 mg Oral Q breakfast   fluPHENAZine  2.5 mg Oral q AM   heparin  5,000 Units Subcutaneous Q8H   hydrALAZINE  25 mg Oral TID   irbesartan  300 mg Oral Daily   isosorbide mononitrate  60 mg Oral BID   minoxidil  2.5 mg Oral Daily   mometasone-formoterol  2 puff Inhalation BID   multivitamin  1 tablet Oral QHS   mouth rinse  15 mL Mouth Rinse 4 times per day   pantoprazole  40 mg Oral Daily   patiromer  16.8 g Oral Daily   polyethylene glycol  17 g Oral BID   trihexyphenidyl  2 mg Oral BID WC    Assessment/ Plan:     Active Problems:   Essential hypertension  ESRD on dialysis [facility])   Mental health disorder   Physical deconditioning   Oral thrush   Malnutrition of moderate degree   Hyperkalemia   Anemia of chronic disease   Hyponatremia  61 year old male with history of hypertension, anemia, schizophrenia, end-stage renal disease on hemodialysis on Monday Wednesday Friday schedule now admitted with history of mental status changes and accelerated hypertension.  [facility] [place]/MWF/Lt AVF   #1: End-stage renal disease on hemodialysis.   Patient receiving dialysis today, UF goal 3 to 3.5 L as tolerated.  Next treatment scheduled for tomorrow.  Will provide additional treatment for fluid removal.  Renal navigator currently awaiting outpatient clinic acceptance due to rehab needs   #2: Anemia with chronic kidney disease: Lab Results  Component Value Date   HGB 10.5 (L) 02/16/2022    Hemoglobin within  acceptable range   #3: Secondary parathyroidism: Continue calcium acetate with meals. Lab Results  Component Value Date   CALCIUM 9.0 02/18/2022   PHOS 6.0 (H) 02/16/2022      #4: Hypertension with lower extremity edema: Patient has poorly controlled hypertension he has been on minoxidil, isosorbide, irbesartan, amlodipine, clonidine, hydralazine and doxazosin. -Blood pressure 153/78 during dialysis   LOS: [facility] 9/20/202311:58 AM

## 2022-02-18 NOTE — Progress Notes (Signed)
Pt 3.5 hour HD complete w/ no complications. Pt alert, vss and no c/o. Report to primary RN.  Start: 0803 End: 9030 4029ml fluid removed 84L BVP No meds ordered w/ HD 86.3kg post bed weight

## 2022-02-18 NOTE — Progress Notes (Signed)
Post HD RN assessment 

## 2022-02-19 LAB — BASIC METABOLIC PANEL
Anion gap: 10 (ref 5–15)
BUN: 83 mg/dL — ABNORMAL HIGH (ref 8–23)
CO2: 27 mmol/L (ref 22–32)
Calcium: 8.8 mg/dL — ABNORMAL LOW (ref 8.9–10.3)
Chloride: 98 mmol/L (ref 98–111)
Creatinine, Ser: 5.81 mg/dL — ABNORMAL HIGH (ref 0.61–1.24)
GFR, Estimated: 10 mL/min — ABNORMAL LOW (ref >60)
Glucose, Bld: 78 mg/dL (ref 70–99)
Potassium: 4.7 mmol/L (ref 3.5–5.1)
Sodium: 135 mmol/L (ref 135–145)

## 2022-02-19 NOTE — Progress Notes (Signed)
Occupational Therapy Treatment Patient Details Name: Todd Brown MRN: [id number] DOB: 1960-12-15 Today's Date: 02/19/2022   History of present illness 61 year old male with past medical history of hypertension and end-stage renal disease on hemodialysis presented to the emergency room from his group home/skilled nursing facility on 7/17 with confusion.  (Patient's baseline is that he is unable to care for himself and has a legal guardian.)  Patient last received dialysis on Friday, 7/14.  From being in the emergency room on 7/17, unable to get dialysis yesterday.  Patient found to have markedly elevated blood pressure with a systolic in the 706C.  Patient with questionable cardiac ischemia as well.   OT comments  Patient in bed and agreeable to OT services. Pt required min A for bed mobility supine <> sit, and transfer sit <> stand. Patient with increased fatigue during session, increased time to follow multi-modal cues. Patient was able to transfer to recliner via stand pivot +2 for safety. Patient left in recliner with chair alarm set, call bell in reach, and all needs met.    Recommendations for follow up therapy are one component of a multi-disciplinary discharge planning process, led by the attending physician.  Recommendations may be updated based on patient status, additional functional criteria and insurance authorization.    Follow Up Recommendations  Skilled nursing-short term rehab (<3 hours/day)    Assistance Recommended at Discharge Frequent or constant Supervision/Assistance  Patient can return home with the following  A lot of help with bathing/dressing/bathroom;A lot of help with walking and/or transfers;Assistance with feeding;Assist for transportation;Direct supervision/assist for medications management;Direct supervision/assist for financial management;Assistance with cooking/housework;Help with stairs or ramp for entrance   Equipment Recommendations  Other (comment) (Defer  to next venue of care.)    Recommendations for Other Services      Precautions / Restrictions Precautions Precautions: Fall Precaution Comments: vision impaired (blind)       Mobility Bed Mobility Overal bed mobility: Needs Assistance Bed Mobility: Supine to Sit     Supine to sit: Min assist     General bed mobility comments: tactile and verbal cueing: Increased fatigue noted during session.    Transfers Overall transfer level: Needs assistance Equipment used: None Transfers: Sit to/from Stand Sit to Stand: Min assist Stand pivot transfers: Min assist, +2 safety/equipment               Balance Overall balance assessment: Needs assistance Sitting-balance support: Feet supported, Bilateral upper extremity supported Sitting balance-[name]: Good     Standing balance support: Bilateral upper extremity supported, During functional activity Standing balance-[name]: Fair                             ADL either performed or assessed with clinical judgement   ADL Overall ADL's : Needs assistance/impaired                                            Extremity/Trunk Assessment Upper Extremity Assessment Upper Extremity Assessment: Generalized weakness   Lower Extremity Assessment Lower Extremity Assessment: Generalized weakness        Vision Baseline Vision/History: 2 Legally blind Patient Visual Report: No change from baseline            Cognition Arousal/Alertness: Lethargic Behavior During Therapy: WFL for tasks assessed/performed Overall Cognitive Status: History of cognitive impairments -  at baseline                                                     Pertinent Vitals/ Pain       Pain Assessment Pain Assessment: No/denies pain   Frequency  Min 2X/week        Progress Toward Goals  OT Goals(current goals can now be found in the care plan section)  Progress towards OT goals:  Progressing toward goals  Acute Rehab OT Goals Patient Stated Goal: to get stronger OT Goal Formulation: With patient Time For Goal Achievement: 02/18/22 Potential to Achieve Goals: [place] Discharge plan remains appropriate;Frequency remains appropriate       AM-PAC OT "6 Clicks" Daily Activity     Outcome Measure   Help from another person eating meals?: A Little Help from another person taking care of personal grooming?: A Little Help from another person toileting, which includes using toliet, bedpan, or urinal?: A Lot Help from another person bathing (including washing, rinsing, drying)?: A Lot Help from another person to put on and taking off regular upper body clothing?: A Little Help from another person to put on and taking off regular lower body clothing?: A Lot 6 Click Score: 15    End of Session Equipment Utilized During Treatment: Oxygen  OT Visit Diagnosis: Unsteadiness on feet (R26.81);Muscle weakness (generalized) (M62.81);Feeding difficulties (R63.3)   Activity Tolerance Patient limited by fatigue   Patient Left in chair;with call bell/phone within reach;with chair alarm set   Nurse Communication Mobility status        Time: 5188-4166 OT Time Calculation (min): 20 min  Charges: OT General Charges $OT Visit: 1 Visit OT Treatments $Therapeutic Activity: 8-22 mins    Joh Rao, OTS 02/19/2022, 4:14 PM

## 2022-02-19 NOTE — Progress Notes (Signed)
Pt did 3hrs UF tx with no issues Tolerated tx well. Report given to floor RN + thrill/bruit to LUA fistula  B/p 161/84 T.  97.7 HR 60 O2  98   Remved 4 L

## 2022-02-19 NOTE — Progress Notes (Signed)
[facility]  PROGRESS NOTE   Subjective:   Patient seen and evaluated during dialysis   HEMODIALYSIS FLOWSHEET:  Blood Flow Rate (mL/min): 400 mL/min Arterial Pressure (mmHg): -140 mmHg Venous Pressure (mmHg): 250 mmHg TMP (mmHg): 14 mmHg Ultrafiltration Rate (mL/min): 1432 mL/min Dialysate Flow Rate (mL/min):  (Pt changed to UF ONLY tx per NP request.) Dialysis Fluid Bolus: Normal Saline Bolus Amount (mL): 300 mL  No complaints at this time   Objective:  Vital signs: Blood pressure (!) 161/81, pulse 63, temperature 98.7 F (37.1 C), temperature source Oral, resp. rate 11, height 6' (1.829 m), weight 90.4 kg, SpO2 99 %.  Intake/Output Summary (Last 24 hours) at 02/19/2022 1026 Last data filed at 02/18/2022 1138 Gross per 24 hour  Intake --  Output 4000 ml  Net -4000 ml    Filed Weights   02/18/22 0752 02/19/22 0600 02/19/22 0758  Weight: 89.6 kg 90.4 kg 90.4 kg     Physical Exam: General:  No acute distress  Head:  Normocephalic, atraumatic. Moist oral mucosal membranes  Eyes:  Anicteric  Lungs:   Clear to auscultation, normal effort  Heart:  S1S2 no rubs  Abdomen:   Soft, nontender, bowel sounds present  Extremities:  2+ peripheral edema.  Neurologic:  Awake, alert, following commands  Skin:  No lesions  Access: Lt AVF     Basic Metabolic Panel: Recent Labs  Lab 02/13/22 0741 02/16/22 0432 02/18/22 0458 02/19/22 0523  NA 132* 133* 133* 135  K 5.6* 5.9* 5.6* 4.7  CL 92* 93* 95* 98  CO2 25 24 26 27   GLUCOSE 95 85 87 78  BUN 92* 99* 97* 83*  CREATININE 6.60* 7.27* 7.19* 5.81*  CALCIUM 9.4 9.3 9.0 8.8*  MG  --   --  1.7  --   PHOS 6.7* 6.0*  --   --      CBC: Recent Labs  Lab 02/13/22 0741 02/16/22 0432  WBC 3.3* 4.7  NEUTROABS 2.1  --   HGB 12.5* 10.5*  HCT 36.6* 31.4*  MCV 100.3* 100.6*  PLT 175 169      Urinalysis: No results for input(s): "COLORURINE", "LABSPEC", "PHURINE", "GLUCOSEU", "HGBUR", "BILIRUBINUR",  "KETONESUR", "PROTEINUR", "UROBILINOGEN", "NITRITE", "LEUKOCYTESUR" in the last 72 hours.  Invalid input(s): "APPERANCEUR"    Imaging: No results found.   Medications:    anticoagulant sodium citrate      (feeding supplement) PROSource Plus  30 mL Oral TID BM   amLODipine  10 mg Oral q morning   calcium acetate  1,334 mg Oral TID WC   carbamazepine  1,000 mg Oral q AM   Chlorhexidine Gluconate Cloth  6 each Topical Daily   cloNIDine  0.2 mg Transdermal Weekly   dextrose  1 Tube Oral Once   doxazosin  4 mg Oral QHS   ferrous sulfate  325 mg Oral Q breakfast   fluPHENAZine  2.5 mg Oral q AM   heparin  5,000 Units Subcutaneous Q8H   hydrALAZINE  25 mg Oral TID   irbesartan  300 mg Oral Daily   isosorbide mononitrate  60 mg Oral BID   minoxidil  2.5 mg Oral Daily   mometasone-formoterol  2 puff Inhalation BID   multivitamin  1 tablet Oral QHS   mouth rinse  15 mL Mouth Rinse 4 times per day   pantoprazole  40 mg Oral Daily   sodium polystyrene  30 g Oral Daily   trihexyphenidyl  2 mg Oral BID WC  Assessment/ Plan:     Active Problems:   Essential hypertension   ESRD on dialysis [facility])   Mental health disorder   Physical deconditioning   Oral thrush   Malnutrition of moderate degree   Hyperkalemia   Anemia of chronic disease   Hyponatremia  61 year old male with history of hypertension, anemia, schizophrenia, end-stage renal disease on hemodialysis on Monday Wednesday Friday schedule now admitted with history of mental status changes and accelerated hypertension.  [facility] DVA /MWF/Lt AVF   #1: End-stage renal disease on hemodialysis.   Dialysis received yesterday, UF 4L achieved. Receiving UF only treatment today, UF goal 3.5-4L as tolerated. Next treatment scheduled for Friday.   Continuing to monitor discharge to assess outpatient needs.    #2: Anemia with chronic kidney disease: Lab Results  Component Value Date   HGB 10.5 (L) 02/16/2022     Hemoglobin at goal   #3: Secondary parathyroidism: Calcium remains at desired target, phosphorus elevated. Continue calcium acetate with meals. Lab Results  Component Value Date   CALCIUM 8.8 (L) 02/19/2022   PHOS 6.0 (H) 02/16/2022      #4: Hypertension with lower extremity edema: Patient has poorly controlled hypertension he has been on minoxidil, isosorbide, irbesartan, amlodipine, clonidine, hydralazine and doxazosin. -Blood pressure 166/81 during dialysis.    LOS: 36 [facility] 9/21/202310:26 AM

## 2022-02-19 NOTE — TOC Progression Note (Signed)
Transition of Care [facility]) - Progression Note    Patient Details  Name: Todd Brown MRN: [id number] Date of Birth: 28-Jul-1960  [facility]) CM/SW [employer], LCSW Phone Number: 02/19/2022, 10:13 AM  Clinical Narrative:   Expanded SNF search.  Expected Discharge Plan: [place] Barriers to Discharge: Continued Medical Work up  Expected Discharge Plan and Services Expected Discharge Plan: [place] arrangements for the past 2 months: [place] Expected Discharge Date: 12/19/21                                     Social Determinants of Health (SDOH) Interventions    Readmission Risk Interventions     No data to display

## 2022-02-19 NOTE — Progress Notes (Signed)
  Progress Note   Patient: Todd Brown FYB:[id number] DOB: 1960-07-01 DOA: 12/15/2021     66 DOS: the patient was seen and examined on 02/19/2022   Brief hospital course: 61 year old male with past medical history of hypertension and end-stage renal disease on hemodialysis presented to the emergency room from his group home/Assisted living facility on 7/17 with confusion.  (Patient's baseline was that he is unable to care for himself and has a legal guardian.)  Patient last received dialysis on Friday, 7/14.    Patient found to have markedly elevated blood pressure with a systolic in the 527P requiring Cardene drip and placement of patient to the ICU. Nephrology consulted and resumed patient's dialysis.  Over the next few weeks, blood pressure better controlled.  Patient was also started on as needed Haldol for agitation.  Hospital course complicated by episode of periorbital swelling bilaterally felt to be secondary to subcutaneous emphysema, resolved on steroids.  Also had episode of oral thrush treated with Magic mouthwash.    9/20.  Patient is receiving daily dialysis for volume overload and hyperkalemia.  I switched Lokelma to Kayexalate.    Assessment and Plan: Hyperkalemia Hypertensive emergency-resolved as of 12/31/2021 Essential HTN ESRD on dialysis ([place]) Hyponatremia. Discussed with nephrology, patient is receiving daily dialysis with the final dose tomorrow.  Patient potassium is better after switched to Kayexalate.   Mental health disorder -stable Continue home medicines.     Oral thrush -resolved Improved.   COPD exacerbation ([place]) - resolved as of 12/18/2021 Improved.   Physical deconditioning Pending SNF placement.   Anemia of chronic disease Hbg stable.     Malnutrition of moderate degree Continue protein supplement.    Hypoglycemia - resolved Condition better.      Subjective:  Discussed with the nurse, patient is eating well, currently denies any short  of breath.  Physical Exam: Vitals:   02/19/22 1100 02/19/22 1112 02/19/22 1128 02/19/22 1135  BP: (!) 154/88 (!) 155/87 (!) 161/84 (!) 161/84  Pulse: 60 62 60 61  Resp: 12 13 15 10   Temp:  97.7 F (36.5 C)  97.7 F (36.5 C)  TempSrc:  Oral    SpO2: 100% 98%  98%  Weight:  84.6 kg    Height:       General exam: Appears calm and comfortable  Respiratory system: Clear to auscultation. Respiratory effort normal. Cardiovascular system: S1 & S2 heard, RRR. No JVD, murmurs, rubs, gallops or clicks.  Gastrointestinal system: Abdomen is nondistended, soft and nontender. No organomegaly or masses felt. Normal bowel sounds heard. Central nervous system: Alert and oriented x2. No focal neurological deficits. Extremities: 2+ leg edema Skin: No rashes, lesions or ulcers Psychiatry: Judgement and insight appear normal. Mood & affect appropriate.   Data Reviewed:  Lab results reviewed.  Family Communication:   Disposition: Status is: Inpatient Remains inpatient appropriate because: Severity of disease, still receiving daily dialysis.  Planned Discharge Destination: Skilled nursing facility    Time spent: 35 minutes  Author: Sharen Hones, MD 02/19/2022 2:04 PM  For on call review [email or web address].

## 2022-02-20 LAB — BASIC METABOLIC PANEL
Anion gap: 13 (ref 5–15)
BUN: 97 mg/dL — ABNORMAL HIGH (ref 8–23)
CO2: 28 mmol/L (ref 22–32)
Calcium: 8.8 mg/dL — ABNORMAL LOW (ref 8.9–10.3)
Chloride: 93 mmol/L — ABNORMAL LOW (ref 98–111)
Creatinine, Ser: 7.08 mg/dL — ABNORMAL HIGH (ref 0.61–1.24)
GFR, Estimated: 8 mL/min — ABNORMAL LOW (ref >60)
Glucose, Bld: 81 mg/dL (ref 70–99)
Potassium: 4.5 mmol/L (ref 3.5–5.1)
Sodium: 134 mmol/L — ABNORMAL LOW (ref 135–145)

## 2022-02-20 NOTE — Progress Notes (Addendum)
Occupational Therapy Re-eval Patient Details Name: Todd Brown MRN: [id number] DOB: 03/11/1961 Today's Date: 02/20/2022   History of present illness 61 year old male with past medical history of hypertension and end-stage renal disease on hemodialysis presented to the emergency room from his group home/skilled nursing facility on 7/17 with confusion.  (Patient's baseline is that he is unable to care for himself and has a legal guardian.)  Patient last received dialysis on Friday, 7/14.  From being in the emergency room on 7/17, unable to get dialysis yesterday.  Patient found to have markedly elevated blood pressure with a systolic in the 250N.  Patient with questionable cardiac ischemia as well.   OT comments  Upon entering the room, pt supine in bed with no c/o pain and agreeable to OT intervention. Pt had HD this morning. Pt performs self care tasks with set up A  from bed level and is agreeable to transferring to recliner chair. Pt performs bed mobility with supervision and cuing for hand placement and technique. Pt stands with RW and transfers to recliner chair with min A for safety. Pt seated in recliner chair with RN present in room to see transfer. Chair alarm activated and all needs within reach.   Per prior therapy notes "[facility] lives at [facility], per chart and previous admission pt SPT to bed/mwc with CGA; Per chart review assist from ALF staff for ADL" OT recommendation changed to home health for pt to return to ALF as he is close to baseline level of functioning at this time.    Recommendations for follow up therapy are one component of a multi-disciplinary discharge planning process, led by the attending physician.  Recommendations may be updated based on patient status, additional functional criteria and insurance authorization.    Follow Up Recommendations  Home health OT    Assistance Recommended at Discharge Frequent or constant Supervision/Assistance  Patient can return  home with the following  A lot of help with bathing/dressing/bathroom;A lot of help with walking and/or transfers;Assistance with feeding;Assist for transportation;Direct supervision/assist for medications management;Direct supervision/assist for financial management;Assistance with cooking/housework;Help with stairs or ramp for entrance   Equipment Recommendations  Other (comment) (defer to next venue of care)       Precautions / Restrictions Precautions Precautions: Fall Precaution Comments: vision impaired (blind)       Mobility Bed Mobility Overal bed mobility: Needs Assistance Bed Mobility: Supine to Sit     Supine to sit: Supervision     General bed mobility comments: Pt needing cuing and increased time to initiate and process.    Transfers Overall transfer level: Needs assistance Equipment used: None Transfers: Sit to/from Stand Sit to Stand: Min assist Stand pivot transfers: Min assist   Step pivot transfers: Min assist     General transfer comment: min A stand pivot transfer to recliner chair with RW from standard bed height     Balance Overall balance assessment: Needs assistance Sitting-balance support: Feet supported, Bilateral upper extremity supported Sitting balance-[name]: Good Sitting balance - Comments: steady static sitting edge of bed   Standing balance support: Bilateral upper extremity supported, During functional activity, Reliant on assistive device for balance Standing balance-[name]: Fair                             ADL either performed or assessed with clinical judgement   ADL Overall ADL's : Needs assistance/impaired     Grooming: Wash/dry hands;Wash/dry face;Sitting;Supervision/safety;Set  up;Oral care;Bed level                                      Extremity/Trunk Assessment Upper Extremity Assessment Upper Extremity Assessment: Generalized weakness            Vision Baseline  Vision/History: 2 Legally blind Patient Visual Report: No change from baseline     Perception     Praxis      Cognition Arousal/Alertness: Awake/alert   Overall Cognitive Status: History of cognitive impairments - at baseline                                 General Comments: Pt needing increased cuing and time to process                   Pertinent Vitals/ Pain       Pain Assessment Pain Assessment: Faces Faces Pain Scale: No hurt         Frequency  Min 2X/week        Progress Toward Goals  OT Goals(current goals can now be found in the care plan section)  Progress towards OT goals: Progressing toward goals  Acute Rehab OT Goals Patient Stated Goal: to get stronger OT Goal Formulation: With patient Time For Goal Achievement: 03/20/22  Plan Discharge plan remains appropriate;Frequency remains appropriate       AM-PAC OT "6 Clicks" Daily Activity     Outcome Measure   Help from another person eating meals?: A Little Help from another person taking care of personal grooming?: A Little Help from another person toileting, which includes using toliet, bedpan, or urinal?: A Lot Help from another person bathing (including washing, rinsing, drying)?: A Lot Help from another person to put on and taking off regular upper body clothing?: A Little Help from another person to put on and taking off regular lower body clothing?: A Lot 6 Click Score: 15    End of Session Equipment Utilized During Treatment: Oxygen;Rolling walker (2 wheels)  OT Visit Diagnosis: Unsteadiness on feet (R26.81);Muscle weakness (generalized) (M62.81);Feeding difficulties (R63.3)   Activity Tolerance Patient tolerated treatment well   Patient Left in chair;with call bell/phone within reach;with chair alarm set   Nurse Communication Mobility status;Other (comment) (RN in room giving medications)        Time: [phone] OT Time Calculation (min): 23 min  Charges: OT  General Charges $OT Visit: 1 Visit OT Evaluation $OT Re-eval: 1 Re-eval OT Treatments $Self Care/Home Management : 8-22 mins   Darleen Crocker, MS, OTR/L , CBIS ascom [phone]  02/20/22, 2:49 PM

## 2022-02-20 NOTE — Progress Notes (Signed)
PT Cancellation Note  Patient Details Name: Berman Grainger MRN: [id number] DOB: [birth year]   Cancelled Treatment:    Reason Eval/Treat Not Completed: Other (comment).  Pt currently off floor at dialysis.  Will re-attempt PT session at a later date/time.  Leitha Bleak, PT 02/20/22, 10:42 AM

## 2022-02-20 NOTE — Progress Notes (Signed)
Pt completed 3.5 hour HD treatment w/ no complications. Alert, no c/o, vss, report to primary RN. Start: 0810 End: 5956 4036ml fluid removed 84L BVP 84.4kg post bed weight No meds ordered w/ HD

## 2022-02-20 NOTE — TOC Progression Note (Addendum)
Transition of Care [facility]) - Progression Note    Patient Details  Name: Todd Brown MRN: [id number] Date of Birth: 1960/12/10  Transition of Care [facility]) CM/SW [place], [place] Phone Number: 02/20/2022, 3:38 PM  Clinical Narrative:     CSW notes per PT patient has improved for them to change their recs to [facility] at this time and return to ALF  CSW spoke with [name] who reports he can accept patient back.   Current plan for patient to dc tomorrow after HD, Estill Bamberg HD coordinator, nephro and MD aware. [name] has been updated on this plan as well.   At discharge, please inform [name]  at [phone] . [name] reports patient will need transportation arranged by TOC at discharge.  Please fax dc summary and updated ALF FL2 to [phone]. Please print hard copy of fl2 and dc summary to send with patient home in dc paperwork.   Expected Discharge Plan: [place] Barriers to Discharge: Continued Medical Work up  Expected Discharge Plan and Services Expected Discharge Plan: [place] arrangements for the past 2 months: [place] Expected Discharge Date: 12/19/21                                     Social Determinants of Health (SDOH) Interventions    Readmission Risk Interventions     No data to display

## 2022-02-20 NOTE — Progress Notes (Signed)
Physical Therapy Treatment Patient Details Name: Todd Brown MRN: [id number] DOB: [birth year] Today's Date: 02/20/2022   History of Present Illness 61 year old male with past medical history of hypertension and end-stage renal disease on hemodialysis presented to the emergency room from his group home/skilled nursing facility on 7/17 with confusion.  (Patient's baseline is that he is unable to care for himself and has a legal guardian.)  Patient last received dialysis on Friday, 7/14.  From being in the emergency room on 7/17, unable to get dialysis yesterday.  Patient found to have markedly elevated blood pressure with a systolic in the 989Q.  Patient with questionable cardiac ischemia as well.    PT Comments    Pt was long sitting in bed upon arriving. He had recently returned to bed form sitting in recliner since OT session. He agrees to sitting EOB and performing ther ex but unwilling to perform OOB. He was able to progress to EOB short sit with supervision only. Session progressed to perform there ex at EOB. Overall tolerated well. Pt seems close to baseline but would benefit from HHPT at DC to maximize safety with ADLs. Recommend pt return to ALF if able.   Recommendations for follow up therapy are one component of a multi-disciplinary discharge planning process, led by the attending physician.  Recommendations may be updated based on patient status, additional functional criteria and insurance authorization.  Follow Up Recommendations  Other (comment) (Recommend pt returning to his ALF with HHPT to follow. He is close to baseline abilities.)     Assistance Recommended at Discharge Intermittent Supervision/Assistance  Patient can return home with the following A little help with walking and/or transfers;A little help with bathing/dressing/bathroom;Direct supervision/assist for medications management;Help with stairs or ramp for entrance;Assist for transportation;Assistance with  feeding;Assistance with cooking/housework;Direct supervision/assist for financial management   Equipment Recommendations  Rolling walker (2 wheels);BSC/3in1;Wheelchair cushion (measurements PT);Wheelchair (measurements PT) (If pt does not have equipment at ALF already)       Precautions / Restrictions Precautions Precautions: Fall Precaution Comments: vision impaired (blind) Restrictions Weight Bearing Restrictions: Yes RLE Weight Bearing: Weight bearing as tolerated LLE Weight Bearing: Weight bearing as tolerated     Mobility  Bed Mobility Overal bed mobility: Needs Assistance Bed Mobility: Supine to Sit  Supine to sit: Supervision    General bed mobility comments: no physical assistance required to progress to/from EOB from supine ><EOB sitting    Transfers    General transfer comment: Pt requested not to get back OOB since he just returned form sitting in recliner. He did perform several exercises at EOB prior to returning to long sitting in bed       Balance Overall balance assessment: Needs assistance Sitting-balance support: Feet supported, Bilateral upper extremity supported Sitting balance-[name]: Good     Standing balance support: Bilateral upper extremity supported, During functional activity, Reliant on assistive device for balance Standing balance-[name]: Fair       Cognition Arousal/Alertness: Awake/alert Behavior During Therapy: WFL for tasks assessed/performed Overall Cognitive Status: History of cognitive impairments - at baseline    Safety/Judgement: Decreased awareness of safety, Decreased awareness of deficits     General Comments: Pt is A and able to follow simple commands. Pleasant and motivated but did not want to get back OOB. Just returned to bed from recliner.           General Comments General comments (skin integrity, edema, etc.): Pt was able to perform EOB seated LAQ, marching, iso hip abd/add,and  UE tricep press       Pertinent Vitals/Pain Pain Assessment Pain Assessment: No/denies pain Pain Score: 0-No pain     PT Goals (current goals can now be found in the care plan section) Acute Rehab PT Goals Patient Stated [facility]: [facility] Progress towards PT goals: Progressing toward goals    Frequency    Min 2X/week      PT Plan Current plan remains appropriate    Co-evaluation     PT goals addressed during session: Mobility/safety with mobility;Proper use of DME;Balance;Strengthening/ROM        AM-PAC PT "6 Clicks" Mobility   Outcome Measure  Help needed turning from your back to your side while in a flat bed without using bedrails?: None Help needed moving from lying on your back to sitting on the side of a flat bed without using bedrails?: A Little Help needed moving to and from a bed to a chair (including a wheelchair)?: A Little Help needed standing up from a chair using your arms (e.g., wheelchair or bedside chair)?: A Little Help needed to walk in hospital room?: A Lot Help needed climbing 3-5 steps with a railing? : A Lot 6 Click Score: 17    End of Session Equipment Utilized During Treatment: Oxygen Activity Tolerance: Patient tolerated treatment well Patient left: in bed;with call bell/phone within reach;with bed alarm set Nurse Communication: Mobility status PT Visit Diagnosis: Other abnormalities of gait and mobility (R26.89);Muscle weakness (generalized) (M62.81);Unsteadiness on feet (R26.81)     Time: 5670-1410 PT Time Calculation (min) (ACUTE ONLY): 10 min  Charges:  $Therapeutic Activity: 8-22 mins                     Julaine Fusi PTA 02/20/22, 4:45 PM

## 2022-02-20 NOTE — Progress Notes (Signed)
Pt is yelling and agitated.Todd Brown

## 2022-02-20 NOTE — Progress Notes (Signed)
[facility]  PROGRESS NOTE   Subjective:   Patient seen and evaluated during dialysis   HEMODIALYSIS FLOWSHEET:  Blood Flow Rate (mL/min): (P) 400 mL/min Arterial Pressure (mmHg): (P) -150 mmHg Venous Pressure (mmHg): (P) 270 mmHg TMP (mmHg): (P) 7 mmHg Ultrafiltration Rate (mL/min): (P) 1316 mL/min Dialysate Flow Rate (mL/min): (P) 300 ml/min Dialysis Fluid Bolus: Normal Saline Bolus Amount (mL): 300 mL  Patient currently cursing and voicing frustration after completing breakfast tray, requesting additional food.  Food provided is not appropriate for patient diet.   Objective:  Vital signs: Blood pressure (!) 162/87, pulse 76, temperature 97.7 F (36.5 C), temperature source Oral, resp. rate 14, height 6' (1.829 m), weight 87.8 kg, SpO2 (P) 98 %.  Intake/Output Summary (Last 24 hours) at 02/20/2022 1040 Last data filed at 02/19/2022 1112 Gross per 24 hour  Intake --  Output 4000 ml  Net -4000 ml    Filed Weights   02/19/22 1112 02/20/22 0449 02/20/22 0747  Weight: 84.6 kg 89.9 kg 87.8 kg     Physical Exam: General:  No acute distress  Head:  Normocephalic, atraumatic. Moist oral mucosal membranes  Eyes:  Anicteric  Lungs:   Clear to auscultation, normal effort  Heart:  S1S2 no rubs  Abdomen:   Soft, nontender, bowel sounds present  Extremities:  2+ peripheral edema.  Neurologic:  Awake, alert, following commands  Skin:  No lesions  Access: Lt AVF     Basic Metabolic Panel: Recent Labs  Lab 02/16/22 0432 02/18/22 0458 02/19/22 0523 02/20/22 0440  NA 133* 133* 135 134*  K 5.9* 5.6* 4.7 4.5  CL 93* 95* 98 93*  CO2 24 26 27 28   GLUCOSE 85 87 78 81  BUN 99* 97* 83* 97*  CREATININE 7.27* 7.19* 5.81* 7.08*  CALCIUM 9.3 9.0 8.8* 8.8*  MG  --  1.7  --   --   PHOS 6.0*  --   --   --      CBC: Recent Labs  Lab 02/16/22 0432  WBC 4.7  HGB 10.5*  HCT 31.4*  MCV 100.6*  PLT 169      Urinalysis: No results for input(s):  "COLORURINE", "LABSPEC", "PHURINE", "GLUCOSEU", "HGBUR", "BILIRUBINUR", "KETONESUR", "PROTEINUR", "UROBILINOGEN", "NITRITE", "LEUKOCYTESUR" in the last 72 hours.  Invalid input(s): "APPERANCEUR"    Imaging: No results found.   Medications:    anticoagulant sodium citrate      (feeding supplement) PROSource Plus  30 mL Oral TID BM   amLODipine  10 mg Oral q morning   calcium acetate  1,334 mg Oral TID WC   carbamazepine  1,000 mg Oral q AM   Chlorhexidine Gluconate Cloth  6 each Topical Daily   cloNIDine  0.2 mg Transdermal Weekly   dextrose  1 Tube Oral Once   doxazosin  4 mg Oral QHS   ferrous sulfate  325 mg Oral Q breakfast   fluPHENAZine  2.5 mg Oral q AM   heparin  5,000 Units Subcutaneous Q8H   hydrALAZINE  25 mg Oral TID   irbesartan  300 mg Oral Daily   isosorbide mononitrate  60 mg Oral BID   minoxidil  2.5 mg Oral Daily   mometasone-formoterol  2 puff Inhalation BID   multivitamin  1 tablet Oral QHS   mouth rinse  15 mL Mouth Rinse 4 times per day   pantoprazole  40 mg Oral Daily   sodium polystyrene  30 g Oral Daily   trihexyphenidyl  2 mg  Oral BID WC    Assessment/ Plan:     Active Problems:   Essential hypertension   ESRD on dialysis [facility])   Mental health disorder   Physical deconditioning   Oral thrush   Malnutrition of moderate degree   Hyperkalemia   Anemia of chronic disease   Hyponatremia  61 year old male with history of hypertension, anemia, schizophrenia, end-stage renal disease on hemodialysis on Monday Wednesday Friday schedule now admitted with history of mental status changes and accelerated hypertension.  [facility]/MWF/Lt AVF   #1: End-stage renal disease on hemodialysis.   Receiving dialysis today, UF goal 4 L as tolerated.  Next treatment scheduled for Friday.   Will continue to manage fluid volume due to patient's increased appetite. Patient given sandwich during treatment, staff instructed to provide in bite size portions.  Patient satisfied and resting quietly.    #2: Anemia with chronic kidney disease: Lab Results  Component Value Date   HGB 10.5 (L) 02/16/2022    Hemoglobin at goal   #3: Secondary parathyroidism: Will continue to monitor bone minerals during this admission. Continue calcium acetate with meals. Lab Results  Component Value Date   CALCIUM 8.8 (L) 02/20/2022   PHOS 6.0 (H) 02/16/2022      #4: Hypertension with lower extremity edema: Patient has poorly controlled hypertension he has been on minoxidil, isosorbide, irbesartan, amlodipine, clonidine, hydralazine and doxazosin. -Blood pressure stable during dialysis   LOS: [facility] 9/22/202310:40 AM

## 2022-02-20 NOTE — Progress Notes (Signed)
Post HD RN assessment 

## 2022-02-20 NOTE — Progress Notes (Signed)
Pt is calm and currently resting.

## 2022-02-20 NOTE — Progress Notes (Signed)
  Progress Note   Patient: Todd Brown BJY:[id number] DOB: [birth year] DOA: 12/15/2021     67 DOS: the patient was seen and examined on 02/20/2022   Brief hospital course: 61 year old male with past medical history of hypertension and end-stage renal disease on hemodialysis presented to the emergency room from his group home/Assisted living facility on 7/17 with confusion.  (Patient's baseline was that he is unable to care for himself and has a legal guardian.)  Patient last received dialysis on Friday, 7/14.    Patient found to have markedly elevated blood pressure with a systolic in the 086V requiring Cardene drip and placement of patient to the ICU. Nephrology consulted and resumed patient's dialysis.  Over the next few weeks, blood pressure better controlled.  Patient was also started on as needed Haldol for agitation.  Hospital course complicated by episode of periorbital swelling bilaterally felt to be secondary to subcutaneous emphysema, resolved on steroids.  Also had episode of oral thrush treated with Magic mouthwash.    9/20.  Patient is receiving daily dialysis for volume overload and hyperkalemia.  I switched Lokelma to Kayexalate.    Assessment and Plan: Hyperkalemia Hypertensive emergency-resolved as of 12/31/2021 Essential HTN ESRD on dialysis [facility]) with volume overload. Myocardial ischemia with elevated troponin secondary to hypertension emergency. Hyponatremia. Patient has been receiving daily hemodialysis for the last week, condition appears to be improving.  Potassium has normalized.  Continue monitoring for another day.    Mental health disorder -stable Continue home medicines.     Oral thrush -resolved Improved.   COPD exacerbation ([place]) - resolved as of 12/18/2021 Improved.   Physical deconditioning Patient evaluated by PT again, appears to back to baseline.  Recommend going back to assisted living facility.   Anemia of chronic disease Hbg stable.      Malnutrition of moderate degree Continue protein supplement.    Hypoglycemia - resolved Condition better.      Subjective:  Patient has good appetite, mobility is better, appeared back to baseline.  Physical Exam: Vitals:   02/20/22 1145 02/20/22 1151 02/20/22 1152 02/20/22 1220  BP: 120/83  129/74 134/75  Pulse: 66   65  Resp: 15   16  Temp: 98.7 F (37.1 C)   98.7 F (37.1 C)  TempSrc: Oral   Oral  SpO2: 99%  95% 99%  Weight:  84.4 kg    Height:       General exam: Appears calm and comfortable  Respiratory system: Clear to auscultation. Respiratory effort normal. Cardiovascular system: S1 & S2 heard, RRR. No JVD, murmurs, rubs, gallops or clicks. No pedal edema. Gastrointestinal system: Abdomen is nondistended, soft and nontender. No organomegaly or masses felt. Normal bowel sounds heard. Central nervous system: Alert and aphasic. Extremities: Symmetric 5 x 5 power. Skin: No rashes, lesions or ulcers Psychiatry:  Mood & affect appropriate.   Data Reviewed:  Lab results reviewed.  Family Communication:   Disposition: Status is: Inpatient Remains inpatient appropriate because: Severity of disease,  Planned Discharge Destination:  ALF    Time spent: 28 minutes  Author: Sharen Hones, MD 02/20/2022 2:57 PM  For on call review [email or web address].

## 2022-02-21 LAB — BASIC METABOLIC PANEL
Anion gap: 13 (ref 5–15)
BUN: 74 mg/dL — ABNORMAL HIGH (ref 8–23)
CO2: 27 mmol/L (ref 22–32)
Calcium: 8.9 mg/dL (ref 8.9–10.3)
Chloride: 94 mmol/L — ABNORMAL LOW (ref 98–111)
Creatinine, Ser: 6.05 mg/dL — ABNORMAL HIGH (ref 0.61–1.24)
GFR, Estimated: 10 mL/min — ABNORMAL LOW (ref >60)
Glucose, Bld: 76 mg/dL (ref 70–99)
Potassium: 4.4 mmol/L (ref 3.5–5.1)
Sodium: 134 mmol/L — ABNORMAL LOW (ref 135–145)

## 2022-02-21 MED ORDER — HYDRALAZINE HCL 25 MG PO TABS: 50.0000 mg | ORAL_TABLET | Freq: Three times a day (TID) | ORAL | 0 refills | Status: DC

## 2022-02-21 MED ORDER — IRBESARTAN 300 MG PO TABS: 300.0000 mg | ORAL_TABLET | Freq: Every day | ORAL | 0 refills | Status: DC

## 2022-02-21 MED ORDER — DOXAZOSIN MESYLATE 4 MG PO TABS: 4.0000 mg | ORAL_TABLET | Freq: Every day | ORAL | 0 refills | Status: DC

## 2022-02-21 MED ORDER — MINOXIDIL 2.5 MG PO TABS: 2.5000 mg | ORAL_TABLET | Freq: Every day | ORAL | 0 refills | Status: DC

## 2022-02-21 MED ORDER — CLONIDINE 0.2 MG/24HR TD PTWK: 0.2000 mg | MEDICATED_PATCH | TRANSDERMAL | 12 refills | Status: DC

## 2022-02-21 MED ORDER — CLONIDINE 0.2 MG/24HR TD PTWK: 0.2000 mg | MEDICATED_PATCH | TRANSDERMAL | 0 refills | Status: DC

## 2022-02-21 MED ORDER — CHLORHEXIDINE GLUCONATE CLOTH 2 % EX PADS
6.0000 | MEDICATED_PAD | Freq: Every day | CUTANEOUS | Status: DC
  Administered 2022-02-22: 6 via TOPICAL

## 2022-02-21 MED ORDER — SODIUM POLYSTYRENE SULFONATE 15 GM/60ML PO SUSP: 15.0000 g | Freq: Every day | ORAL | 0 refills | Status: DC

## 2022-02-21 NOTE — Progress Notes (Signed)
[facility]  Dialysis Note   Subjective:   Seen and examined on hemodialysis treatment.  Tolerating well.   HEMODIALYSIS FLOWSHEET:  Blood Flow Rate (mL/min): 400 mL/min Arterial Pressure (mmHg): -210 mmHg Venous Pressure (mmHg): 260 mmHg TMP (mmHg): 12 mmHg Ultrafiltration Rate (mL/min): 1307 mL/min Dialysate Flow Rate (mL/min): 300 ml/min Dialysis Fluid Bolus: Normal Saline Bolus Amount (mL): 300 mL    Objective:  Vital signs in last 24 hours:  Temp:  [98 F (36.7 C)-99 F (37.2 C)] 98.3 F (36.8 C) (09/23 1133) Pulse Rate:  [53-67] 63 (09/23 1133) Resp:  [10-18] 12 (09/23 1133) BP: (116-190)/(73-89) 148/80 (09/23 1139) SpO2:  [95 %-100 %] 100 % (09/23 1133) Weight:  [83.5 kg-84.8 kg] 83.5 kg (09/23 0801)  Weight change: -2.6 kg Filed Weights   02/20/22 1151 02/21/22 0500 02/21/22 0801  Weight: 84.4 kg 84.8 kg 83.5 kg    Intake/Output: I/O last 3 completed shifts: In: 240 [P.O.:240] Out: 4000 [Other:4000]   Intake/Output this shift:  Total I/O In: -  Out: 4000 [Other:4000]  Physical Exam: General: NAD,   Head: Normocephalic, atraumatic. Moist oral mucosal membranes  Eyes: Anicteric, PERRL  Neck: Supple, trachea midline  Lungs:  Clear to auscultation  Heart: Regular rate and rhythm  Abdomen:  Soft, nontender,   Extremities:  peripheral edema.  Neurologic: Nonfocal, moving all four extremities  Skin: No lesions  Access:     Basic Metabolic Panel: Recent Labs  Lab 02/16/22 0432 02/18/22 0458 02/19/22 0523 02/20/22 0440 02/21/22 0728  NA 133* 133* 135 134* 134*  K 5.9* 5.6* 4.7 4.5 4.4  CL 93* 95* 98 93* 94*  CO2 24 26 27 28 27   GLUCOSE 85 87 78 81 76  BUN 99* 97* 83* 97* 74*  CREATININE 7.27* 7.19* 5.81* 7.08* 6.05*  CALCIUM 9.3 9.0 8.8* 8.8* 8.9  MG  --  1.7  --   --   --   PHOS 6.0*  --   --   --   --     Liver Function Tests: Recent Labs  Lab 02/16/22 0432  ALBUMIN 3.3*   No results for input(s): "LIPASE",  "AMYLASE" in the last 168 hours. No results for input(s): "AMMONIA" in the last 168 hours.  CBC: Recent Labs  Lab 02/16/22 0432  WBC 4.7  HGB 10.5*  HCT 31.4*  MCV 100.6*  PLT 169    Cardiac Enzymes: No results for input(s): "CKTOTAL", "CKMB", "CKMBINDEX", "TROPONINI" in the last 168 hours.  BNP: Invalid input(s): "POCBNP"  CBG: No results for input(s): "GLUCAP" in the last 168 hours.  Microbiology: Results for orders placed or performed during the hospital encounter of 12/15/21  SARS Coronavirus 2 by RT PCR (hospital order, performed in [facility] hospital lab) *cepheid single result test* Anterior Nasal Swab     Status: None   Collection Time: 12/15/21  5:50 PM   Specimen: Anterior Nasal Swab  Result Value Ref Range Status   SARS Coronavirus 2 by RT PCR NEGATIVE NEGATIVE Final    Comment: (NOTE) SARS-CoV-2 target nucleic acids are NOT DETECTED.  The SARS-CoV-2 RNA is generally detectable in upper and lower respiratory specimens during the acute phase of infection. The lowest concentration of SARS-CoV-2 viral copies this assay can detect is 250 copies / mL. A negative result does not preclude SARS-CoV-2 infection and should not be used as the sole basis for treatment or other patient management decisions.  A negative result may occur with improper specimen collection / handling,  submission of specimen other than nasopharyngeal swab, presence of viral mutation(s) within the areas targeted by this assay, and inadequate number of viral copies (<250 copies / mL). A negative result must be combined with clinical observations, patient history, and epidemiological information.  Fact Sheet for Patients:   https://www.patel.info/  Fact Sheet for Healthcare Providers: https://hall.com/  This test is not yet approved or  cleared by the [place] FDA and has been authorized for detection and/or diagnosis of SARS-CoV-2 by FDA  under an Emergency Use Authorization (EUA).  This EUA will remain in effect (meaning this test can be used) for the duration of the COVID-19 declaration under Section 564(b)(1) of the Act, 21 U.S.C. section 360bbb-3(b)(1), unless the authorization is terminated or revoked sooner.  Performed at [facility], [address] [place], [place] [address]   MRSA Next Gen by PCR, Nasal     Status: None   Collection Time: 12/15/21  9:03 PM   Specimen: Nasal Mucosa; Nasal Swab  Result Value Ref Range Status   MRSA by PCR Next Gen NOT DETECTED NOT DETECTED Final    Comment: (NOTE) The GeneXpert MRSA Assay (FDA approved for NASAL specimens only), is one component of a comprehensive MRSA colonization surveillance program. It is not intended to diagnose MRSA infection nor to guide or monitor treatment for MRSA infections. Test performance is not FDA approved in patients less than 35 years old. Performed at [facility], [address] [place], [place] [address]     Coagulation Studies: No results for input(s): "LABPROT", "INR" in the last 72 hours.  Urinalysis: No results for input(s): "COLORURINE", "LABSPEC", "PHURINE", "GLUCOSEU", "HGBUR", "BILIRUBINUR", "KETONESUR", "PROTEINUR", "UROBILINOGEN", "NITRITE", "LEUKOCYTESUR" in the last 72 hours.  Invalid input(s): "APPERANCEUR"    Imaging: No results found.   Medications:    anticoagulant sodium citrate      (feeding supplement) PROSource Plus  30 mL Oral TID BM   amLODipine  10 mg Oral q morning   calcium acetate  1,334 mg Oral TID WC   carbamazepine  1,000 mg Oral q AM   [START ON 02/22/2022] Chlorhexidine Gluconate Cloth  6 each Topical Q0600   cloNIDine  0.2 mg Transdermal Weekly   dextrose  1 Tube Oral Once   doxazosin  4 mg Oral QHS   ferrous sulfate  325 mg Oral Q breakfast   fluPHENAZine  2.5 mg Oral q AM   heparin  5,000 Units Subcutaneous Q8H   hydrALAZINE  25 mg Oral TID   irbesartan  300 mg Oral  Daily   isosorbide mononitrate  60 mg Oral BID   minoxidil  2.5 mg Oral Daily   mometasone-formoterol  2 puff Inhalation BID   multivitamin  1 tablet Oral QHS   mouth rinse  15 mL Mouth Rinse 4 times per day   pantoprazole  40 mg Oral Daily   sodium polystyrene  30 g Oral Daily   trihexyphenidyl  2 mg Oral BID WC   albuterol, alteplase, anticoagulant sodium citrate, diphenhydrAMINE, haloperidol lactate, heparin, hydrALAZINE, lidocaine (PF), lidocaine-prilocaine, ondansetron **OR** ondansetron (ZOFRAN) IV, mouth rinse, pentafluoroprop-tetrafluoroeth, senna-docusate  Assessment/ Plan:  Mr. Ether Wolters is a 61 y.o.  male   Active Problems:   Essential hypertension   ESRD on dialysis ([place])   Mental health disorder   Physical deconditioning   Oral thrush   Malnutrition of moderate degree   Hyperkalemia   Anemia of chronic disease   Hyponatremia     End Stage Renal  Disease on hemodialysis:   Tolerating dialysis well. Discharge planning in progress.  2. Hypertension with chronic kidney disease: Stable blood pressures.   BP (!) 148/80 (BP Location: Right Arm)   Pulse 63   Temp 98.3 F (36.8 C) (Oral)   Resp 12   Ht 6' (1.829 m)   Wt 83.5 kg   SpO2 100%   BMI 24.97 kg/m   3. Anemia of chronic kidney disease/ kidney injury/chronic disease/acute blood loss:   Lab Results  Component Value Date   HGB 10.5 (L) 02/16/2022  Continue anemia protocol.  4. Secondary Hyperparathyroidism:     Lab Results  Component Value Date   CALCIUM 8.9 02/21/2022   PHOS 6.0 (H) 02/16/2022   Continue phosphate binders. Discharge planning in progress for today.    LOS: [place], [facility] 9/23/202311:42 AM

## 2022-02-21 NOTE — Progress Notes (Signed)
Pre HD RN assessment performed by C. Fransisco RN under K. Emidio Warrell Patent attorney

## 2022-02-21 NOTE — NC FL2 (Signed)
[place] LEVEL OF CARE SCREENING TOOL     IDENTIFICATION  Patient Name: Todd Brown Birthdate: [birth year] Sex: male Admission Date (Current Location): 12/15/2021  [place] and [place] Number:  Todd Brown [id number] [place] and Address:  [facility], [address], [place], [place] [address]      Provider Number: [id number]  Attending Physician Name and Address:  Sharen Hones, MD  Relative Name and Phone Number:  Carey Bullocks (legal guardian) [phone]    Current Level of Care: Hospital Recommended Level of Care: [place] Prior Approval Number:    Date Approved/Denied:   PASRR Number: [id number]  Discharge Plan: Other (Comment) (ALF)    Current Diagnoses: Patient Active Problem List   Diagnosis Date Noted   Hyponatremia 02/10/2022   Anemia of chronic disease 01/21/2022   Hyperkalemia 01/14/2022   Malnutrition of moderate degree 12/24/2021   Oral thrush 12/22/2021   Physical deconditioning 12/20/2021   Mental health disorder 12/19/2021   Essential hypertension 12/15/2021   ESRD on dialysis ([place]) 12/15/2021    Orientation RESPIRATION BLADDER Height & Weight     Self  Normal  (Dialysis) Weight: 79.2 kg Height:  6' (182.9 cm)  BEHAVIORAL SYMPTOMS/MOOD NEUROLOGICAL BOWEL NUTRITION STATUS      Continent Diet (Dys 2 thin liquids)  AMBULATORY STATUS COMMUNICATION OF NEEDS Skin    (needs use of 2 wheeled walker) Verbally Normal                       Personal Care Assistance Level of Assistance  Bathing, Feeding, Dressing Bathing Assistance: Limited assistance Feeding assistance: Limited assistance Dressing Assistance: Limited assistance Total Care Assistance: Maximum assistance   Functional Limitations Info  Sight, Hearing, Speech Sight Info: Impaired Hearing Info: Impaired Speech Info: Impaired    SPECIAL CARE FACTORS FREQUENCY  PT (By licensed PT), OT (By licensed OT)     PT Frequency:  5 times a week OT Frequency: 5 times a week            Contractures Contractures Info: Not present    Additional Factors Info  Code Status Code Status Info: Full Code Allergies Info: No Known Allergies           Medication List       STOP taking these medications     cloNIDine 0.3 MG tablet Commonly known as: CATAPRES    losartan 100 MG tablet Commonly known as: COZAAR    meloxicam 7.5 MG tablet Commonly known as: MOBIC           TAKE these medications     acetaminophen 500 MG tablet Commonly known as: TYLENOL Take 500-1,000 mg by mouth every 6 (six) hours as needed for mild pain.    albuterol 108 (90 Base) MCG/ACT inhaler Commonly known as: VENTOLIN HFA Inhale 1-2 puffs into the lungs every 4 (four) hours as needed for wheezing or shortness of breath.    amLODipine 10 MG tablet Commonly known as: NORVASC Take 10 mg by mouth every morning.    b complex-vitamin c-folic acid 0.8 MG Tabs tablet Take 1 tablet by mouth at bedtime.    calcium acetate 667 MG capsule Commonly known as: PHOSLO Take 1,334 mg by mouth 3 (three) times daily with meals.    carbamazepine 200 MG tablet Commonly known as: TEGRETOL Take 1,000 mg by mouth every morning.    cholecalciferol 25 MCG (1000 UNIT) tablet Commonly known as: VITAMIN D3 Take 1,000 Units by mouth daily.  cloNIDine 0.2 mg/24hr patch Commonly known as: CATAPRES - Dosed in mg/24 hr Place 1 patch (0.2 mg total) onto the skin once a week. Start taking on: February 23, 2022    doxazosin 4 MG tablet Commonly known as: CARDURA Take 1 tablet (4 mg total) by mouth at bedtime.    ferrous sulfate 325 (65 FE) MG tablet Take 325 mg by mouth daily with breakfast.    fluPHENAZine 2.5 MG tablet Commonly known as: PROLIXIN Take 2.5 mg by mouth daily.    fluticasone-salmeterol 250-50 MCG/ACT Aepb Commonly known as: ADVAIR Inhale 1 puff into the lungs in the morning and at bedtime.    hydrALAZINE 25 MG  tablet Commonly known as: APRESOLINE Take 2 tablets (50 mg total) by mouth 3 (three) times daily. What changed:  medication strength how much to take    irbesartan 300 MG tablet Commonly known as: AVAPRO Take 1 tablet (300 mg total) by mouth daily. Start taking on: February 22, 2022    isosorbide mononitrate 60 MG 24 hr tablet Commonly known as: IMDUR Take 60 mg by mouth 2 (two) times daily.    minoxidil 2.5 MG tablet Commonly known as: LONITEN Take 1 tablet (2.5 mg total) by mouth daily. Start taking on: February 22, 2022    omeprazole 40 MG capsule Commonly known as: PRILOSEC Take 40 mg by mouth every morning.    polyethylene glycol 17 g packet Commonly known as: MIRALAX / GLYCOLAX Take 17 g by mouth daily as needed for mild constipation.    sodium polystyrene 15 GM/60ML suspension Commonly known as: KAYEXALATE Take 60 mLs (15 g total) by mouth daily for 10 days. Start taking on: February 22, 2022    trihexyphenidyl 2 MG tablet Commonly known as: ARTANE Take 2 mg by mouth 2 (two) times daily with a meal.    Discharge Medications: Please see discharge summary for a list of discharge medications.  Relevant Imaging Results:  Relevant Lab Results:   Additional Information SS#: [id number]  Harriet Masson, RN

## 2022-02-21 NOTE — TOC Transition Note (Addendum)
Transition of Care [facility]) - CM/SW Discharge Note   Patient Details  Name: Murlin Schrieber MRN: [id number] Date of Birth: 11/26/1960  [facility]) CM/SW Contact:  Magnus Ivan, LCSW Phone Number: 02/21/2022, 2:17 PM   Clinical Narrative:    CSW called [name] with [name]. He confirmed patient can DC back to [facility] today with new o2 being set up. He confirmed they would like EMS transport arranged. Informed him RNCM completed FL2 which will be in DC folder. [name] denied additional needs.  O2 referral made to [facility] with [employer] for delivery to the group home today since patient will go by EMS. [name] agreed to call CSW when o2 has been delivered so EMS can be called.  Will call for EMS when o2 has been delivered.   2:45- Call from [facility] with [employer] who stated they have patient on file to already have home o2 through [employer]. Called [name] who checked with his [name] staff and confirmed patient does not have o2 there. Delana Meyer is checking which address the original set up was delivered to. Delana Meyer stated they would need to pick up the old set up from wherever it is before a new set up can be delivered to [place], and she is checking to see if this can be done today or not.   4:05- Called Jasmine with [employer] to follow up. She stated the address they delivered o2 was in 2019 at [address], [place], [place]. Per chart review, patient has been at [facility] since around 2021. CSW called Thedore Mins with [employer] who stated he will check on what they can do to make an exception.   4:30- Zach with [employer] stated he got approval for delivery of new o2 to [facility] tomorrow. He stated they are able to have a driver deliver the o2 to [facility] by tomorrow morning 9/24 so patient can DC to [facility] tomorrow once this is delivered. RN updated Candace (Guardian). Updated MD, RN, and [name]. [name] confirmed patient can come tomorrow once o2 is delivered.      Barriers to Discharge: Continued Medical Work up   Patient Goals and CMS Choice Patient states their goals for this hospitalization and ongoing recovery are:: SNF CMS Medicare.gov Compare Post Acute Care list provided to:: Patient Represenative (must comment) Choice offered to / list presented to : [place] / [place]  Discharge Placement                       Discharge Plan and Services                                     Social Determinants of Health (SDOH) Interventions     Readmission Risk Interventions     No data to display

## 2022-02-21 NOTE — TOC Transition Note (Signed)
Transition of Care [facility]) - CM/SW Discharge Note   Patient Details  Name: Todd Brown MRN: [id number] Date of Birth: 1961/05/02  [facility]) CM/SW Contact:  Harriet Masson, RN Phone Number:[phone] 02/21/2022, 1:59 PM   Clinical Narrative:     Pt ready for discharge today. TOC RN attempted to  contacted the group home twice and left vm. TOC RN also contacted the involved DSS social worker lvm with no response. O2 sat [facility] for set up home O2.  TOC printed the FL2 to there floor to go into his discharge packet but again I am awaiting a cb to move forward.  TOC team aware of the delay. TOC team will continue to reach out to the ALF for transport via EMS for this pt.    Barriers to Discharge: Continued Medical Work up   Patient Goals and CMS Choice Patient states their goals for this hospitalization and ongoing recovery are:: SNF CMS Medicare.gov Compare Post Acute Care list provided to:: Patient Represenative (must comment) Choice offered to / list presented to : [place] / [place]  Discharge Placement                       Discharge Plan and Services                                     Social Determinants of Health (SDOH) Interventions     Readmission Risk Interventions     No data to display

## 2022-02-21 NOTE — Progress Notes (Signed)
Pt completed 3.5 hour HD treatment w/ no complications. Alert, vss, report to primary RN. Start: 0803 End: 1470 4044ml fluid removed 84L BVP 79.2kg post hd bed weight No meds ordered w/ HD

## 2022-02-21 NOTE — Progress Notes (Signed)
Post HD RN assessment 

## 2022-02-21 NOTE — Discharge Summary (Signed)
Physician Discharge Summary   Patient: Todd Brown MRN: [id number] DOB: 04/16/1961  Admit date:     12/15/2021  Discharge date: 02/21/22  Discharge Physician: Sharen Hones   PCP: Jodi Marble, MD   Recommendations at discharge:   Follow-up with PCP in 1 week. Follow-up with nephrology for continued hemodialysis.  Discharge Diagnoses: Active Problems:   Hyperkalemia   ESRD on dialysis [facility])   Mental health disorder   Essential hypertension   Oral thrush   Physical deconditioning   Malnutrition of moderate degree   Anemia of chronic disease   Hyponatremia  Principal Problem (Resolved):   Acute metabolic encephalopathy Resolved Problems:   Hypertensive emergency   Subcutaneous emphysema [facility])  Hospital Course: 61 year old male with past medical history of hypertension and end-stage renal disease on hemodialysis presented to the emergency room from his group home/Assisted living facility on 7/17 with confusion.  (Patient's baseline was that he is unable to care for himself and has a legal guardian.)  Patient last received dialysis on Friday, 7/14.    Patient found to have markedly elevated blood pressure with a systolic in the 956O requiring Cardene drip and placement of patient to the ICU. Nephrology consulted and resumed patient's dialysis.  Over the next few weeks, blood pressure better controlled.  Patient was also started on as needed Haldol for agitation.  Hospital course complicated by episode of periorbital swelling bilaterally felt to be secondary to subcutaneous emphysema, resolved on steroids.  Also had episode of oral thrush treated with Magic mouthwash.    9/20.  Patient is receiving daily dialysis for volume overload and hyperkalemia.  I switched Lokelma to Kayexalate.    Assessment and Plan: Hyperkalemia Hypertensive emergency-resolved as of 12/31/2021 Essential HTN ESRD on dialysis [facility]) with volume overload. Myocardial ischemia with elevated troponin  secondary to hypertension emergency. Hyponatremia. Patient has been receiving daily hemodialysis for the past week for volume overload and hyperkalemia.  Patient will also switch to Kayexalate, which appears to be more effective for patient, potassium has normalized since.  At this point, he is medically stable to be discharged. He was waiting for nursing home placement, but PT/OT now recommending going back to group home.  Patient has been accepted back. I will continue Kayexalate for now, dialysis will decide the length of treatment. Please note that the patient blood pressure medicine has been adjusted.     Mental health disorder -stable Continue home medicines.     Oral thrush -resolved Improved.   COPD exacerbation ([place]) - resolved as of 12/18/2021 Improved.   Physical deconditioning Patient evaluated by PT again, appears to back to baseline.  Recommend going back to assisted living facility.   Anemia of chronic disease Hbg stable.     Malnutrition of moderate degree Continue protein supplement.    Hypoglycemia - resolved Condition better.       Consultants: Nephrology Procedures performed: HD  Disposition: Group home Diet recommendation:  Discharge Diet Orders (From admission, onward)     Start     Ordered   02/21/22 0000  Diet general       Comments: Dys 2 diet with thin liquid   02/21/22 1156   12/19/21 0000  DIET DYS 3       Question:  Fluid consistency:  Answer:  Thin   12/19/21 1349           Cardiac diet DISCHARGE MEDICATION: Allergies as of 02/21/2022   No Known Allergies      Medication List  STOP taking these medications    cloNIDine 0.3 MG tablet Commonly known as: CATAPRES   losartan 100 MG tablet Commonly known as: COZAAR   meloxicam 7.5 MG tablet Commonly known as: MOBIC       TAKE these medications    acetaminophen 500 MG tablet Commonly known as: TYLENOL Take 500-1,000 mg by mouth every 6 (six) hours as needed for  mild pain.   albuterol 108 (90 Base) MCG/ACT inhaler Commonly known as: VENTOLIN HFA Inhale 1-2 puffs into the lungs every 4 (four) hours as needed for wheezing or shortness of breath.   amLODipine 10 MG tablet Commonly known as: NORVASC Take 10 mg by mouth every morning.   b complex-vitamin c-folic acid 0.8 MG Tabs tablet Take 1 tablet by mouth at bedtime.   calcium acetate 667 MG capsule Commonly known as: PHOSLO Take 1,334 mg by mouth 3 (three) times daily with meals.   carbamazepine 200 MG tablet Commonly known as: TEGRETOL Take 1,000 mg by mouth every morning.   cholecalciferol 25 MCG (1000 UNIT) tablet Commonly known as: VITAMIN D3 Take 1,000 Units by mouth daily.   cloNIDine 0.2 mg/24hr patch Commonly known as: CATAPRES - Dosed in mg/24 hr Place 1 patch (0.2 mg total) onto the skin once a week. Start taking on: February 23, 2022   doxazosin 4 MG tablet Commonly known as: CARDURA Take 1 tablet (4 mg total) by mouth at bedtime.   ferrous sulfate 325 (65 FE) MG tablet Take 325 mg by mouth daily with breakfast.   fluPHENAZine 2.5 MG tablet Commonly known as: PROLIXIN Take 2.5 mg by mouth daily.   fluticasone-salmeterol 250-50 MCG/ACT Aepb Commonly known as: ADVAIR Inhale 1 puff into the lungs in the morning and at bedtime.   hydrALAZINE 25 MG tablet Commonly known as: APRESOLINE Take 2 tablets (50 mg total) by mouth 3 (three) times daily. What changed:  medication strength how much to take   irbesartan 300 MG tablet Commonly known as: AVAPRO Take 1 tablet (300 mg total) by mouth daily. Start taking on: February 22, 2022   isosorbide mononitrate 60 MG 24 hr tablet Commonly known as: IMDUR Take 60 mg by mouth 2 (two) times daily.   minoxidil 2.5 MG tablet Commonly known as: LONITEN Take 1 tablet (2.5 mg total) by mouth daily. Start taking on: February 22, 2022   omeprazole 40 MG capsule Commonly known as: PRILOSEC Take 40 mg by mouth every  morning.   polyethylene glycol 17 g packet Commonly known as: MIRALAX / GLYCOLAX Take 17 g by mouth daily as needed for mild constipation.   sodium polystyrene 15 GM/60ML suspension Commonly known as: KAYEXALATE Take 60 mLs (15 g total) by mouth daily for 10 days. Start taking on: February 22, 2022   trihexyphenidyl 2 MG tablet Commonly known as: ARTANE Take 2 mg by mouth 2 (two) times daily with a meal.               Discharge Care Instructions  (From admission, onward)           Start     Ordered   02/21/22 0000  Discharge wound care:       Comments: Follow with HD   02/21/22 1156            Follow-up Information     Jodi Marble, MD Follow up in 1 week(s).   Specialty: Internal Medicine Contact information: [address] [place] [address] [phone]  Discharge Exam: Filed Weights   02/21/22 0500 02/21/22 0801 02/21/22 1153  Weight: 84.8 kg 83.5 kg 79.2 kg   General exam: Appears calm and comfortable  Respiratory system: Clear to auscultation. Respiratory effort normal. Cardiovascular system: S1 & S2 heard, RRR. No JVD, murmurs, rubs, gallops or clicks. No pedal edema. Gastrointestinal system: Abdomen is nondistended, soft and nontender. No organomegaly or masses felt. Normal bowel sounds heard. Central nervous system: Alert and oriented x2. No focal neurological deficits. Extremities: Symmetric 5 x 5 power. Skin: No rashes, lesions or ulcers Psychiatry:  Mood & affect appropriate.    Condition at discharge: good  The results of significant diagnostics from this hospitalization (including imaging, microbiology, ancillary and laboratory) are listed below for reference.   Imaging Studies: DG Chest 1 View  Result Date: 02/10/2022 CLINICAL DATA:  End-stage renal disease. EXAM: CHEST  1 VIEW COMPARISON:  12/17/2021 FINDINGS: Stable moderate cardiac enlargement. Stable positioning and appearance right-sided  tunneled hemodialysis catheter with catheter tip projecting at the SVC/RA junction. Lungs demonstrate chronic component of pulmonary venous hypertension and probable mild chronic pulmonary interstitial edema without overt airspace edema. No airspace consolidation or pneumothorax. Trace left pleural fluid. IMPRESSION: Chronic pulmonary venous hypertension/pulmonary interstitial edema. Trace left pleural fluid. Electronically Signed   By: Aletta Edouard M.D.   On: 02/10/2022 12:16    Microbiology: Results for orders placed or performed during the hospital encounter of 12/15/21  SARS Coronavirus 2 by RT PCR (hospital order, performed in [facility] hospital lab) *cepheid single result test* Anterior Nasal Swab     Status: None   Collection Time: 12/15/21  5:50 PM   Specimen: Anterior Nasal Swab  Result Value Ref Range Status   SARS Coronavirus 2 by RT PCR NEGATIVE NEGATIVE Final    Comment: (NOTE) SARS-CoV-2 target nucleic acids are NOT DETECTED.  The SARS-CoV-2 RNA is generally detectable in upper and lower respiratory specimens during the acute phase of infection. The lowest concentration of SARS-CoV-2 viral copies this assay can detect is 250 copies / mL. A negative result does not preclude SARS-CoV-2 infection and should not be used as the sole basis for treatment or other patient management decisions.  A negative result may occur with improper specimen collection / handling, submission of specimen other than nasopharyngeal swab, presence of viral mutation(s) within the areas targeted by this assay, and inadequate number of viral copies (<250 copies / mL). A negative result must be combined with clinical observations, patient history, and epidemiological information.  Fact Sheet for Patients:   https://www.patel.info/  Fact Sheet for Healthcare Providers: https://hall.com/  This test is not yet approved or  cleared by the [place] FDA  and has been authorized for detection and/or diagnosis of SARS-CoV-2 by FDA under an Emergency Use Authorization (EUA).  This EUA will remain in effect (meaning this test can be used) for the duration of the COVID-19 declaration under Section 564(b)(1) of the Act, 21 U.S.C. section 360bbb-3(b)(1), unless the authorization is terminated or revoked sooner.  Performed at [facility], [address] [place], [place] [address]   MRSA Next Gen by PCR, Nasal     Status: None   Collection Time: 12/15/21  9:03 PM   Specimen: Nasal Mucosa; Nasal Swab  Result Value Ref Range Status   MRSA by PCR Next Gen NOT DETECTED NOT DETECTED Final    Comment: (NOTE) The GeneXpert MRSA Assay (FDA approved for NASAL specimens only), is one component of a comprehensive MRSA colonization surveillance program.  It is not intended to diagnose MRSA infection nor to guide or monitor treatment for MRSA infections. Test performance is not FDA approved in patients less than 64 years old. Performed at [facility], [address] [place], [place] [address]     Labs: CBC: Recent Labs  Lab 02/16/22 0432  WBC 4.7  HGB 10.5*  HCT 31.4*  MCV 100.6*  PLT 160   Basic Metabolic Panel: Recent Labs  Lab 02/16/22 0432 02/18/22 0458 02/19/22 0523 02/20/22 0440 02/21/22 0728  NA 133* 133* 135 134* 134*  K 5.9* 5.6* 4.7 4.5 4.4  CL 93* 95* 98 93* 94*  CO2 24 26 27 28 27   GLUCOSE 85 87 78 81 76  BUN 99* 97* 83* 97* 74*  CREATININE 7.27* 7.19* 5.81* 7.08* 6.05*  CALCIUM 9.3 9.0 8.8* 8.8* 8.9  MG  --  1.7  --   --   --   PHOS 6.0*  --   --   --   --    Liver Function Tests: Recent Labs  Lab 02/16/22 0432  ALBUMIN 3.3*   CBG: No results for input(s): "GLUCAP" in the last 168 hours.  Discharge time spent: greater than 30 minutes.  Signed: Sharen Hones, MD [facility] 02/21/2022

## 2022-02-21 NOTE — Progress Notes (Addendum)
SATURATION QUALIFICATIONS: (This note is used to comply with regulatory documentation for home oxygen)  Patient Saturations on Room Air at Rest = 92%  Patient Saturations on Room Air while Ambulating = 83%  Patient Saturations on 3 Liters of oxygen while at rest = 100%  Please briefly explain why patient needs home oxygen: Pt demonstrates heart rate elevation up to 142 bpm and SpO2 desaturation at 83% when exhibiting minimal exertion (approx. 10 steps) on room air. Pt maintains within normal limits while receiving 3L O2.

## 2022-02-21 NOTE — TOC Transition Note (Signed)
Transition of Care [facility]) - CM/SW Discharge Note   Patient Details  Name: Todd Brown MRN: [id number] Date of Birth: May 17, 1961  [name]) CM/SW Contact:  Harriet Masson, RN Phone Number:[phone] 02/21/2022, 12:52 PM   Clinical Narrative:    Plans for discharge today back to ALF. TOC RN has contacted the facility twice today and left voice message to confirm discharge position with supervisor at the ALF [name] and Fax [phone]) however only able to leave a message requesting a call back. TOC RN has also left a message with the involved social worker Joanne Chars [phone]). No return calls at this time. FL2, d/c summary and medical necessity/facesheet printed to the floor for discharge packet and EMS when called.  TOC currently awaiting a call back from the facility for pt return to the facility. Will update team accordingly with discharge disposition.     Barriers to Discharge: Continued Medical Work up   Patient Goals and CMS Choice Patient states their goals for this hospitalization and ongoing recovery are:: SNF CMS Medicare.gov Compare Post Acute Care list provided to:: Patient Represenative (must comment) Choice offered to / list presented to : [place] / [place]  Discharge Placement                       Discharge Plan and Services                                     Social Determinants of Health (SDOH) Interventions     Readmission Risk Interventions     No data to display

## 2022-02-22 NOTE — TOC Progression Note (Addendum)
Transition of Care [facility]) - Progression Note    Patient Details  Name: Todd Brown MRN: [id number] Date of Birth: 09-03-1960  Transition of Care [facility]) CM/SW [place], LCSW Phone Number: 02/22/2022, 8:46 AM  Clinical Narrative:    Reached out to [facility] with [employer] to confirm if o2 was delivered to [facility]. Awaiting update.  9:35- Requested update from [facility] with [employer].   9:40- Called Clement with [name] who confirmed o2 has been delivered. He requested DC Summary and FL2 be faxed and hard copy also be sent. [name] confirms patient can come back today via EMS. Reponse received from [facility] with [employer] who also confirms o2 was delivered.   Expected Discharge Plan: [place] Barriers to Discharge: Continued Medical Work up  Expected Discharge Plan and Services Expected Discharge Plan: [place] arrangements for the past 2 months: [place] Expected Discharge Date: 02/21/22                                     Social Determinants of Health (SDOH) Interventions    Readmission Risk Interventions     No data to display

## 2022-02-22 NOTE — TOC Transition Note (Signed)
Transition of Care [facility]) - CM/SW Discharge Note   Patient Details  Name: Todd Brown MRN: [id number] Date of Birth: 1960-07-29  Transition of Care [facility]) CM/SW Contact:  Magnus Ivan, LCSW Phone Number: 02/22/2022, 10:13 AM   Clinical Narrative:    Patient discharging back to [facility]. Confirmed with [name]. Faxed DC Summary and FL2, copies have also been placed in DC packet.  O2 arranged through [employer] and confirmed it has been delivered to [place] already. EMS paperwork completed. EMS called for transport, patient is 2nd on the list as of 10:13 am, care team notified.    Final next level of care: Group Home Barriers to Discharge: Barriers Resolved   Patient Goals and CMS Choice Patient states their goals for this hospitalization and ongoing recovery are:: SNF CMS Medicare.gov Compare Post Acute Care list provided to:: Patient Represenative (must comment) Choice offered to / list presented to : [facility] POA / Guardian  Discharge Placement                Patient to be transferred to facility by: [facility]      Discharge Plan and Services                DME Arranged: Oxygen DME Agency: [employer] Date DME Agency Contacted: 02/22/22   Representative spoke with at DME Agency: Thedore Mins            Social Determinants of Health ([place]) Interventions     Readmission Risk Interventions     No data to display

## 2022-02-22 NOTE — Discharge Summary (Signed)
Physician Discharge Summary   Patient: Todd Brown MRN: [id number] DOB: 1960-08-22  Admit date:     12/15/2021  Discharge date: 02/22/22  Discharge Physician: Sharen Hones   PCP: Jodi Marble, MD   Recommendations at discharge:   Follow-up with PCP in 1 week. Follow-up with nephrology for continued hemodialysis. Please make sure patient is on renal diet with low potassium and fluid restriction.  Discharge Diagnoses: Active Problems:   Hyperkalemia   ESRD on dialysis [facility])   Mental health disorder   Essential hypertension   Oral thrush   Physical deconditioning   Malnutrition of moderate degree   Anemia of chronic disease   Hyponatremia  Principal Problem (Resolved):   Acute metabolic encephalopathy Resolved Problems:   Hypertensive emergency   Subcutaneous emphysema [facility])  Hospital Course: 61 year old male with past medical history of hypertension and end-stage renal disease on hemodialysis presented to the emergency room from his group home/Assisted living facility on 7/17 with confusion.  (Patient's baseline was that he is unable to care for himself and has a legal guardian.)  Patient last received dialysis on Friday, 7/14.    Patient found to have markedly elevated blood pressure with a systolic in the 578I requiring Cardene drip and placement of patient to the ICU. Nephrology consulted and resumed patient's dialysis.  Over the next few weeks, blood pressure better controlled.  Patient was also started on as needed Haldol for agitation.  Hospital course complicated by episode of periorbital swelling bilaterally felt to be secondary to subcutaneous emphysema, resolved on steroids.  Also had episode of oral thrush treated with Magic mouthwash.    9/20.  Patient is receiving daily dialysis for volume overload and hyperkalemia.  I switched Lokelma to Kayexalate.    Assessment and Plan: Hyperkalemia Hypertensive emergency-resolved as of 12/31/2021 Essential HTN ESRD  on dialysis [facility]) with volume overload. Myocardial ischemia with elevated troponin secondary to hypertension emergency. Hyponatremia. Patient has been receiving daily hemodialysis for the past week for volume overload and hyperkalemia.  Patient will also switch to Kayexalate, which appears to be more effective for patient, potassium has normalized since.  At this point, he is medically stable to be discharged. He was waiting for nursing home placement, but PT/OT now recommending going back to group home.  Patient has been accepted back. I will continue Kayexalate for now, dialysis will decide the length of treatment. Please note that the patient blood pressure medicine has been adjusted.     Mental health disorder -stable Continue home medicines.     Oral thrush -resolved Improved.   COPD exacerbation ([place]) - resolved as of 12/18/2021 Improved.   Physical deconditioning Patient evaluated by PT again, appears to back to baseline.  Recommend going back to assisted living facility.   Anemia of chronic disease Hbg stable.     Malnutrition of moderate degree Continue protein supplement.    Hypoglycemia - resolved Condition better.   Patient was discharged yesterday, but home walking evaluation with a drop of oxygenation to 83% after walking 10 steps.  Oxygen was not able to be delivered yesterday.  Oxygen delivered today.  I reexamined today, there is no change inpatient status, patient is still medically stable to be discharged.       Consultants: Nephrology Procedures performed: HD  Disposition:  Group home Diet recommendation:  Discharge Diet Orders (From admission, onward)     Start     Ordered   02/22/22 0000  Diet general  Comments: Dys 2 with renal diet, thin liquid   02/22/22 0955   12/19/21 0000  DIET DYS 3       Question:  Fluid consistency:  Answer:  Thin   12/19/21 1349           Dysphagia type 2 Thin Liquid  Renal diet DISCHARGE  MEDICATION: Allergies as of 02/22/2022   No Known Allergies      Medication List     STOP taking these medications    cloNIDine 0.3 MG tablet Commonly known as: CATAPRES   losartan 100 MG tablet Commonly known as: COZAAR   meloxicam 7.5 MG tablet Commonly known as: MOBIC       TAKE these medications    acetaminophen 500 MG tablet Commonly known as: TYLENOL Take 500-1,000 mg by mouth every 6 (six) hours as needed for mild pain.   albuterol 108 (90 Base) MCG/ACT inhaler Commonly known as: VENTOLIN HFA Inhale 1-2 puffs into the lungs every 4 (four) hours as needed for wheezing or shortness of breath.   amLODipine 10 MG tablet Commonly known as: NORVASC Take 10 mg by mouth every morning.   b complex-vitamin c-folic acid 0.8 MG Tabs tablet Take 1 tablet by mouth at bedtime.   calcium acetate 667 MG capsule Commonly known as: PHOSLO Take 1,334 mg by mouth 3 (three) times daily with meals.   carbamazepine 200 MG tablet Commonly known as: TEGRETOL Take 1,000 mg by mouth every morning.   cholecalciferol 25 MCG (1000 UNIT) tablet Commonly known as: VITAMIN D3 Take 1,000 Units by mouth daily.   cloNIDine 0.2 mg/24hr patch Commonly known as: CATAPRES - Dosed in mg/24 hr Place 1 patch (0.2 mg total) onto the skin once a week. Start taking on: February 23, 2022   doxazosin 4 MG tablet Commonly known as: CARDURA Take 1 tablet (4 mg total) by mouth at bedtime.   ferrous sulfate 325 (65 FE) MG tablet Take 325 mg by mouth daily with breakfast.   fluPHENAZine 2.5 MG tablet Commonly known as: PROLIXIN Take 2.5 mg by mouth daily.   fluticasone-salmeterol 250-50 MCG/ACT Aepb Commonly known as: ADVAIR Inhale 1 puff into the lungs in the morning and at bedtime.   hydrALAZINE 25 MG tablet Commonly known as: APRESOLINE Take 2 tablets (50 mg total) by mouth 3 (three) times daily. What changed:  medication strength how much to take   irbesartan 300 MG  tablet Commonly known as: AVAPRO Take 1 tablet (300 mg total) by mouth daily.   isosorbide mononitrate 60 MG 24 hr tablet Commonly known as: IMDUR Take 60 mg by mouth 2 (two) times daily.   minoxidil 2.5 MG tablet Commonly known as: LONITEN Take 1 tablet (2.5 mg total) by mouth daily.   omeprazole 40 MG capsule Commonly known as: PRILOSEC Take 40 mg by mouth every morning.   polyethylene glycol 17 g packet Commonly known as: MIRALAX / GLYCOLAX Take 17 g by mouth daily as needed for mild constipation.   sodium polystyrene 15 GM/60ML suspension Commonly known as: KAYEXALATE Take 60 mLs (15 g total) by mouth daily for 10 days.   trihexyphenidyl 2 MG tablet Commonly known as: ARTANE Take 2 mg by mouth 2 (two) times daily with a meal.               Durable Medical Equipment  (From admission, onward)           Start     Ordered   02/21/22 1431  For  home use only DME oxygen  Once       Question Answer Comment  Length of Need Lifetime   Liters per Minute 2   Frequency Continuous (stationary and portable oxygen unit needed)   Oxygen conserving device Yes   Oxygen delivery system Gas      02/21/22 1430              Discharge Care Instructions  (From admission, onward)           Start     Ordered   02/22/22 0000  Discharge wound care:       Comments: Follow in HD   02/22/22 0955   02/21/22 0000  Discharge wound care:       Comments: Follow with HD   02/21/22 1156            Follow-up Information     Jodi Marble, MD Follow up in 1 week(s).   Specialty: Internal Medicine Contact information: [address] [place] [address] [phone]                Discharge Exam: Filed Weights   02/21/22 0801 02/21/22 1153 02/22/22 0500  Weight: 83.5 kg 79.2 kg 83 kg   General exam: Appears calm and comfortable  Respiratory system: Clear to auscultation. Respiratory effort normal. Cardiovascular system: S1 & S2 heard, RRR. No  JVD, murmurs, rubs, gallops or clicks. No pedal edema. Gastrointestinal system: Abdomen is nondistended, soft and nontender. No organomegaly or masses felt. Normal bowel sounds heard. Central nervous system: Alert and oriented x2. No focal neurological deficits. Extremities: Symmetric 5 x 5 power. Skin: No rashes, lesions or ulcers Psychiatry:  Mood & affect appropriate.    Condition at discharge: good  The results of significant diagnostics from this hospitalization (including imaging, microbiology, ancillary and laboratory) are listed below for reference.   Imaging Studies: DG Chest 1 View  Result Date: 02/10/2022 CLINICAL DATA:  End-stage renal disease. EXAM: CHEST  1 VIEW COMPARISON:  12/17/2021 FINDINGS: Stable moderate cardiac enlargement. Stable positioning and appearance right-sided tunneled hemodialysis catheter with catheter tip projecting at the SVC/RA junction. Lungs demonstrate chronic component of pulmonary venous hypertension and probable mild chronic pulmonary interstitial edema without overt airspace edema. No airspace consolidation or pneumothorax. Trace left pleural fluid. IMPRESSION: Chronic pulmonary venous hypertension/pulmonary interstitial edema. Trace left pleural fluid. Electronically Signed   By: Aletta Edouard M.D.   On: 02/10/2022 12:16    Microbiology: Results for orders placed or performed during the hospital encounter of 12/15/21  SARS Coronavirus 2 by RT PCR (hospital order, performed in [facility] hospital lab) *cepheid single result test* Anterior Nasal Swab     Status: None   Collection Time: 12/15/21  5:50 PM   Specimen: Anterior Nasal Swab  Result Value Ref Range Status   SARS Coronavirus 2 by RT PCR NEGATIVE NEGATIVE Final    Comment: (NOTE) SARS-CoV-2 target nucleic acids are NOT DETECTED.  The SARS-CoV-2 RNA is generally detectable in upper and lower respiratory specimens during the acute phase of infection. The lowest concentration of  SARS-CoV-2 viral copies this assay can detect is 250 copies / mL. A negative result does not preclude SARS-CoV-2 infection and should not be used as the sole basis for treatment or other patient management decisions.  A negative result may occur with improper specimen collection / handling, submission of specimen other than nasopharyngeal swab, presence of viral mutation(s) within the areas targeted by this assay, and inadequate number of  viral copies (<250 copies / mL). A negative result must be combined with clinical observations, patient history, and epidemiological information.  Fact Sheet for Patients:   https://www.patel.info/  Fact Sheet for Healthcare Providers: https://hall.com/  This test is not yet approved or  cleared by the [place] FDA and has been authorized for detection and/or diagnosis of SARS-CoV-2 by FDA under an Emergency Use Authorization (EUA).  This EUA will remain in effect (meaning this test can be used) for the duration of the COVID-19 declaration under Section 564(b)(1) of the Act, 21 U.S.C. section 360bbb-3(b)(1), unless the authorization is terminated or revoked sooner.  Performed at [facility], [address] [place], [place] [address]   MRSA Next Gen by PCR, Nasal     Status: None   Collection Time: 12/15/21  9:03 PM   Specimen: Nasal Mucosa; Nasal Swab  Result Value Ref Range Status   MRSA by PCR Next Gen NOT DETECTED NOT DETECTED Final    Comment: (NOTE) The GeneXpert MRSA Assay (FDA approved for NASAL specimens only), is one component of a comprehensive MRSA colonization surveillance program. It is not intended to diagnose MRSA infection nor to guide or monitor treatment for MRSA infections. Test performance is not FDA approved in patients less than 56 years old. Performed at [facility], [address] [place], [place] [address]     Labs: CBC: Recent Labs  Lab  02/16/22 0432  WBC 4.7  HGB 10.5*  HCT 31.4*  MCV 100.6*  PLT 115   Basic Metabolic Panel: Recent Labs  Lab 02/16/22 0432 02/18/22 0458 02/19/22 0523 02/20/22 0440 02/21/22 0728  NA 133* 133* 135 134* 134*  K 5.9* 5.6* 4.7 4.5 4.4  CL 93* 95* 98 93* 94*  CO2 24 26 27 28 27   GLUCOSE 85 87 78 81 76  BUN 99* 97* 83* 97* 74*  CREATININE 7.27* 7.19* 5.81* 7.08* 6.05*  CALCIUM 9.3 9.0 8.8* 8.8* 8.9  MG  --  1.7  --   --   --   PHOS 6.0*  --   --   --   --    Liver Function Tests: Recent Labs  Lab 02/16/22 0432  ALBUMIN 3.3*   CBG: No results for input(s): "GLUCAP" in the last 168 hours.  Discharge time spent: less than 30 minutes.  Signed: Sharen Hones, MD [facility] 02/22/2022

## 2022-02-22 NOTE — Progress Notes (Signed)
Patient OOF in stable condition via EMS. D/c instructions placed in packet

## 2022-02-22 NOTE — Progress Notes (Signed)
Physical Therapy Treatment Patient Details Name: Todd Brown MRN: [id number] DOB: 1961/03/21 Today's Date: 02/22/2022   History of Present Illness 61 year old male with past medical history of hypertension and end-stage renal disease on hemodialysis presented to the emergency room from his group home/skilled nursing facility on 7/17 with confusion.  (Patient's baseline is that he is unable to care for himself and has a legal guardian.)  Patient last received dialysis on Friday, 7/14.  From being in the emergency room on 7/17, unable to get dialysis yesterday.  Patient found to have markedly elevated blood pressure with a systolic in the 142L.  Patient with questionable cardiac ischemia as well.    PT Comments    Pt needing to use BSC.  To EOB with time but no physical assist.  Stood and transferred to [facility] with min a x 1.  Large soft BM.  Full assist for care.  Initially refused recliner but when rephrased he does not resist and is able to stand and walk a few steps to recliner with verbal and tactile cues.  Remain in recliner with needs met. Continues to self limit gait to transfers only.   Recommendations for follow up therapy are one component of a multi-disciplinary discharge planning process, led by the attending physician.  Recommendations may be updated based on patient status, additional functional criteria and insurance authorization.  Follow Up Recommendations  Other (comment)     Assistance Recommended at Discharge Intermittent Supervision/Assistance  Patient can return home with the following A little help with walking and/or transfers;A little help with bathing/dressing/bathroom;Direct supervision/assist for medications management;Help with stairs or ramp for entrance;Assist for transportation;Assistance with feeding;Assistance with cooking/housework;Direct supervision/assist for financial management   Equipment Recommendations  Rolling walker (2 wheels);BSC/3in1;Wheelchair  cushion (measurements PT);Wheelchair (measurements PT)    Recommendations for Other Services       Precautions / Restrictions Precautions Precautions: Fall Precaution Comments: vision impaired (blind) Restrictions Weight Bearing Restrictions: Yes RLE Weight Bearing: Weight bearing as tolerated LLE Weight Bearing: Weight bearing as tolerated     Mobility  Bed Mobility Overal bed mobility: Needs Assistance Bed Mobility: Supine to Sit Rolling: Supervision         General bed mobility comments: no physical assistance required to progress to/from EOB from supine ><EOB sitting    Transfers Overall transfer level: Needs assistance Equipment used: Rolling walker (2 wheels) Transfers: Sit to/from Stand Sit to Stand: Min assist   Step pivot transfers: Min assist       General transfer comment: to commode then to recliner    Ambulation/Gait Ambulation/Gait assistance: Min guard Gait Distance (Feet): 3 Feet Assistive device: Rolling walker (2 wheels) Gait Pattern/deviations: Step-to pattern Gait velocity: decreased     General Gait Details: good steps to commode adn recliner but continues to self limit gait   Stairs             Wheelchair Mobility    Modified Rankin (Stroke Patients Only)       Balance Overall balance assessment: Needs assistance Sitting-balance support: Feet supported, Bilateral upper extremity supported Sitting balance-[name]: Good     Standing balance support: Bilateral upper extremity supported, During functional activity, Reliant on assistive device for balance Standing balance-[name]: Fair Standing balance comment: with RW for UE support, able to march in place while maintaining dynamic standing balance with Min guard  Cognition Arousal/Alertness: Awake/alert Behavior During Therapy: WFL for tasks assessed/performed Overall Cognitive Status: History of cognitive impairments - at  baseline                                          Exercises      General Comments        Pertinent Vitals/Pain Pain Assessment Pain Assessment: No/denies pain    Home Living                          Prior Function            PT Goals (current goals can now be found in the care plan section) Progress towards PT goals: Progressing toward goals    Frequency    Min 2X/week      PT Plan Current plan remains appropriate    Co-evaluation              AM-PAC PT "6 Clicks" Mobility   Outcome Measure  Help needed turning from your back to your side while in a flat bed without using bedrails?: None Help needed moving from lying on your back to sitting on the side of a flat bed without using bedrails?: None Help needed moving to and from a bed to a chair (including a wheelchair)?: A Little Help needed standing up from a chair using your arms (e.g., wheelchair or bedside chair)?: A Little Help needed to walk in hospital room?: A Little Help needed climbing 3-5 steps with a railing? : A Lot 6 Click Score: 19    End of Session Equipment Utilized During Treatment: Oxygen Activity Tolerance: Patient tolerated treatment well Patient left: in chair;with call bell/phone within reach;with chair alarm set Nurse Communication: Mobility status PT Visit Diagnosis: Other abnormalities of gait and mobility (R26.89);Muscle weakness (generalized) (M62.81);Unsteadiness on feet (R26.81)     Time: 0174-9449 PT Time Calculation (min) (ACUTE ONLY): 15 min  Charges:  $Therapeutic Activity: 8-22 mins                    Chesley Noon, PTA 02/22/22, 9:54 AM

## 2022-03-04 ENCOUNTER — Encounter (INDEPENDENT_AMBULATORY_CARE_PROVIDER_SITE_OTHER): Payer: Self-pay | Admitting: Nurse Practitioner

## 2022-03-04 ENCOUNTER — Ambulatory Visit (INDEPENDENT_AMBULATORY_CARE_PROVIDER_SITE_OTHER): Payer: Medicaid Other

## 2022-03-04 ENCOUNTER — Ambulatory Visit (INDEPENDENT_AMBULATORY_CARE_PROVIDER_SITE_OTHER): Payer: Medicaid Other | Admitting: Nurse Practitioner

## 2022-03-04 VITALS — BP 148/95 | HR 80 | Resp 19

## 2022-03-04 DIAGNOSIS — N186 End stage renal disease: Secondary | ICD-10-CM

## 2022-03-04 DIAGNOSIS — I10 Essential (primary) hypertension: Secondary | ICD-10-CM | POA: Diagnosis not present

## 2022-03-04 DIAGNOSIS — J449 Chronic obstructive pulmonary disease, unspecified: Secondary | ICD-10-CM

## 2022-03-06 ENCOUNTER — Telehealth (INDEPENDENT_AMBULATORY_CARE_PROVIDER_SITE_OTHER): Payer: Self-pay

## 2022-03-06 NOTE — Telephone Encounter (Signed)
Spoke with the patient and he is scheduled with Dr. Delana Meyer on 03/27/22 for a left jump graft revision at the [facility]. Pre-op is on 03/18/22 between 1-5 pm. Pre-surgical instructions were discussed and will be mailed.

## 2022-03-09 ENCOUNTER — Encounter (INDEPENDENT_AMBULATORY_CARE_PROVIDER_SITE_OTHER): Payer: Self-pay | Admitting: Nurse Practitioner

## 2022-03-09 NOTE — H&P (View-Only) (Signed)
Subjective:    Patient ID: Todd Brown, male    DOB: September 19, 1960, 61 y.o.   MRN: [id number] No chief complaint on file.   Cardioversion returns today for follow-up after his recent fistulogram on 08/26/2021.  The patient has a very aneurysmal fistula.  He has a flow volume of 817 with no areas of significant stenosis.  The patient has previously been planned to have a jump graft revision due to a large aneurysm and the evidence of skin threatening.    Review of Systems  Hematological:  Bruises/bleeds easily.  Psychiatric/Behavioral:  Positive for agitation.   All other systems reviewed and are negative.      Objective:   Physical Exam Vitals reviewed.  HENT:     Head: Normocephalic.  Cardiovascular:     Rate and Rhythm: Normal rate.     Pulses: Normal pulses.     Arteriovenous access: Left arteriovenous access is present.    Comments: Large aneurysmal fistula skin threatening Pulmonary:     Effort: Pulmonary effort is normal.  Skin:    General: Skin is warm and dry.  Neurological:     Mental Status: He is alert and oriented to person, place, and time.  Psychiatric:        Mood and Affect: Mood normal.        Behavior: Behavior normal.        Thought Content: Thought content normal.        Judgment: Judgment normal.     BP (!) 148/95 (BP Location: Right Arm)   Pulse 80   Resp 19   Past Medical History:  Diagnosis Date   Anemia    Dialysis patient ([place])    Mon. 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   ESRD (end stage renal disease) ([place])    ESRD (end stage renal disease) on dialysis ([place])    Hyperlipidemia    Hypertension    Malignant hypertension    Pneumonia 2016   Renal artery stenosis (HCC)    Schizophrenia (HCC)     Social History   Socioeconomic History   Marital status: Single    Spouse name: Not on file   Number of children: 0   Years of education: Not on file   Highest education level:  Not on file  Occupational History   Occupation: Disability   Tobacco Use   Smoking status: Never    Passive exposure: Never   Smokeless tobacco: Never  Vaping Use   Vaping Use: Never used  Substance and Sexual Activity   Alcohol use: Not Currently   Drug use: Not Currently    Types: Marijuana    Comment: not since living at assisted living place 03/2020   Sexual activity: Not Currently  Other Topics Concern   Not on file  Social History Narrative   ** Merged History Encounter **       Living at [facility] [place], [place] is legal guardian    Social Determinants of Health   Financial Resource Strain: Low Risk  (10/08/2018)   Overall Financial Resource Strain (CARDIA)    Difficulty of Paying Living Expenses: Not hard at all  Food Insecurity: No Food Insecurity (10/08/2018)   Hunger Vital Sign    Worried About Running Out of Food in the Last Year: Never true    Ran Out of Food in the Last Year: Never true  Transportation  Needs: No Transportation Needs (10/08/2018)   PRAPARE - Hydrologist (Medical): No    Lack of Transportation (Non-Medical): No  Physical Activity: Unknown (10/08/2018)   Exercise Vital Sign    Days of Exercise per Week: Patient refused    Minutes of Exercise per Session: Patient refused  Stress: No Stress Concern Present (10/08/2018)   [place]    Feeling of Stress : Not at all  Social Connections: Unknown (10/08/2018)   Social Connection and Isolation Panel [NHANES]    Frequency of Communication with Friends and Family: Patient refused    Frequency of Social Gatherings with Friends and Family: Patient refused    Attends Religious Services: Patient refused    Active Member of Clubs or Organizations: Patient refused    Attends Archivist Meetings: Patient refused    Marital Status: Patient refused  Intimate  Partner Violence: Unknown (10/08/2018)   Humiliation, Afraid, Rape, and Kick questionnaire    Fear of Current or Ex-Partner: Patient refused    Emotionally Abused: Patient refused    Physically Abused: Patient refused    Sexually Abused: Patient refused    Past Surgical History:  Procedure Laterality Date   A/V FISTULAGRAM N/A 08/06/2016   Procedure: A/V Fistulagram;  Surgeon: Algernon Huxley, MD;  Location: [facility];  Service: Cardiovascular;  Laterality: N/A;   A/V FISTULAGRAM N/A 02/14/2021   Procedure: A/V FISTULAGRAM poss Perm Cath Insertion;  Surgeon: Katha Cabal, MD;  Location: [place] [facility];  Service: Cardiovascular;  Laterality: N/A;   A/V FISTULAGRAM Left 08/26/2021   Procedure: A/V Fistulagram;  Surgeon: Algernon Huxley, MD;  Location: [place] [facility];  Service: Cardiovascular;  Laterality: Left;   A/V SHUNT INTERVENTION N/A 08/06/2016   Procedure: A/V Shunt Intervention;  Surgeon: Algernon Huxley, MD;  Location: [place] [facility];  Service: Cardiovascular;  Laterality: N/A;   AV FISTULA PLACEMENT Left 12/26/2014   Procedure: ARTERIOVENOUS (AV) FISTULA CREATION;  Surgeon: Algernon Huxley, MD;  Location: [facility];  Service: Vascular;  Laterality: Left;   AV FISTULA PLACEMENT     ESOPHAGOGASTRODUODENOSCOPY (EGD) WITH PROPOFOL N/A 05/06/2017   Procedure: ESOPHAGOGASTRODUODENOSCOPY (EGD) WITH PROPOFOL;  Surgeon: Jonathon Bellows, MD;  Location: [facility] END[facility]COPY;  Service: Gastroenterology;  Laterality: N/A;   INSERTION OF DIALYSIS CATHETER Right    PERIPHERAL VASCULAR CATHETERIZATION N/A 10/22/2014   Procedure: Dialysis/Perma Catheter Insertion;  Surgeon: Algernon Huxley, MD;  Location: [place] [facility];  Service: Cardiovascular;  Laterality: N/A;   PERIPHERAL VASCULAR CATHETERIZATION N/A 02/28/2015   Procedure: Dialysis/Perma Catheter Removal;  Surgeon: Algernon Huxley, MD;  Location: [place] [facility];  Service: Cardiovascular;  Laterality: N/A;   Repair fx left lower leg      yrs ago (age 8)   TONSILLECTOMY      Family History  Problem Relation Age of Onset   Kidney disease Brother    Diabetes Neg Hx     Allergies  Allergen Reactions   Chlorpromazine Other (See Comments)    Reaction:  Unknown , pt states it makes him feel real bad Reaction:  Unknown , pt states it makes him feel real bad        Latest Ref Rng & Units 02/16/2022    4:32 AM 02/13/2022    7:41 AM 02/10/2022    6:13 AM  CBC  WBC 4.0 - 10.5 K/uL 4.7  3.3  3.7  Hemoglobin 13.0 - 17.0 g/dL 10.5  12.5  10.8   Hematocrit 39.0 - 52.0 % 31.4  36.6  31.7   Platelets 150 - 400 K/uL 169  175  152       CMP     Component Value Date/Time   NA 134 (L) 02/21/2022 0728   K 4.4 02/21/2022 0728   CL 94 (L) 02/21/2022 0728   CO2 27 02/21/2022 0728   GLUCOSE 76 02/21/2022 0728   BUN 74 (H) 02/21/2022 0728   CREATININE 6.05 (H) 02/21/2022 0728   CREATININE 3.47 (H) 03/23/2013 1501   CALCIUM 8.9 02/21/2022 0728   CALCIUM 8.1 (L) 10/22/2014 0504   PROT 6.8 12/15/2021 1203   ALBUMIN 3.3 (L) 02/16/2022 0432   AST 28 12/15/2021 1203   ALT 20 12/15/2021 1203   ALKPHOS 61 12/15/2021 1203   BILITOT 0.7 12/15/2021 1203   GFRNONAA 10 (L) 02/21/2022 0728   GFRAA 7 (L) 10/23/2019 0405     No results found.     Assessment & Plan:   1. End stage renal disease ([employer]) Had discussion with the patient and his attendant about aneurysmal fistula   Discussed the procedure with the patient as well as the risk benefits and alternatives.  We discussed placement of a jump graft with removal ligation of his fistula.  The patient would also need PermCath placement during this time.  The patient is currently a ward of DSS.  We have contacted him for consent.  We will move once consent is obtained.  2. Essential hypertension, benign Continue antihypertensive medications as already ordered, these medications have been reviewed and there are no changes at this time.   3. Chronic obstructive pulmonary  disease, unspecified COPD type ([place]) Continue pulmonary medications and aerosols as already ordered, these medications have been reviewed and there are no changes at this time.     Current Outpatient Medications on File Prior to Visit  Medication Sig Dispense Refill   acetaminophen (TYLENOL) 500 MG tablet Take 500-1,000 mg by mouth every 6 (six) hours as needed for mild pain or moderate pain.     acetaminophen (TYLENOL) 500 MG tablet Take 500-1,000 mg by mouth every 6 (six) hours as needed for mild pain.     albuterol (VENTOLIN HFA) 108 (90 Base) MCG/ACT inhaler Inhale 1-2 puffs into the lungs every 4 (four) hours as needed for wheezing or shortness of breath. Shake well     albuterol (VENTOLIN HFA) 108 (90 Base) MCG/ACT inhaler Inhale 1-2 puffs into the lungs every 4 (four) hours as needed for wheezing or shortness of breath.     amLODipine (NORVASC) 10 MG tablet Take 10 mg by mouth every morning.     b complex-vitamin c-folic acid (NEPHRO-VITE) 0.8 MG TABS tablet Take 1 tablet by mouth at bedtime.     b complex-vitamin c-folic acid (NEPHRO-VITE) 0.8 MG TABS tablet Take 1 tablet by mouth at bedtime.     calcium acetate (PHOSLO) 667 MG capsule Take 2 capsules (1,334 mg total) by mouth 3 (three) times daily with meals. 60 capsule 1   calcium acetate (PHOSLO) 667 MG capsule Take 1,334 mg by mouth 3 (three) times daily with meals.     carbamazepine (TEGRETOL) 200 MG tablet Take 1,000 mg by mouth in the morning.     carbamazepine (TEGRETOL) 200 MG tablet Take 1,000 mg by mouth every morning.     cholecalciferol (VITAMIN D) 1000 UNITS tablet Take 1,000 Units by mouth daily.  cholecalciferol (VITAMIN D3) 25 MCG (1000 UNIT) tablet Take 1,000 Units by mouth daily.     cloNIDine (CATAPRES - DOSED IN MG/24 HR) 0.2 mg/24hr patch Place 1 patch (0.2 mg total) onto the skin once a week. 4 patch 0   cloNIDine (CATAPRES) 0.3 MG tablet Take 0.3 mg by mouth 3 (three) times daily.     doxazosin (CARDURA) 4 MG  tablet Take 1 tablet (4 mg total) by mouth at bedtime. 30 tablet 0   ferrous sulfate 325 (65 FE) MG tablet Take 1 tablet (325 mg total) by mouth daily. 30 tablet 3   ferrous sulfate 325 (65 FE) MG tablet Take 325 mg by mouth daily with breakfast.     fluPHENAZine (PROLIXIN) 2.5 MG tablet Take 2.5 mg by mouth daily.     fluPHENAZine (PROLIXIN) 2.5 MG tablet Take 2.5 mg by mouth daily.     fluticasone-salmeterol (ADVAIR) 250-50 MCG/ACT AEPB Inhale 1 puff into the lungs in the morning and at bedtime.     Fluticasone-Salmeterol (ADVAIR) 250-50 MCG/DOSE AEPB Inhale 1 puff into the lungs 2 (two) times daily. Rinse mouth after use discard 30 days after opening     hydrALAZINE (APRESOLINE) 100 MG tablet Take 1 tablet (100 mg total) by mouth 3 (three) times daily.     irbesartan (AVAPRO) 300 MG tablet Take 1 tablet (300 mg total) by mouth daily. 30 tablet 0   isosorbide mononitrate (IMDUR) 60 MG 24 hr tablet Take 1 tablet (60 mg total) by mouth 2 (two) times daily. 60 tablet 1   isosorbide mononitrate (IMDUR) 60 MG 24 hr tablet Take 60 mg by mouth 2 (two) times daily.     losartan (COZAAR) 100 MG tablet Take 1 tablet (100 mg total) by mouth daily. 30 tablet 0   meloxicam (MOBIC) 7.5 MG tablet Take 7.5 mg by mouth daily.     minoxidil (LONITEN) 2.5 MG tablet Take 1 tablet (2.5 mg total) by mouth daily. 30 tablet 0   omeprazole (PRILOSEC) 40 MG capsule Take 1 capsule (40 mg total) by mouth daily. 30 capsule 1   omeprazole (PRILOSEC) 40 MG capsule Take 40 mg by mouth every morning.     polyethylene glycol (MIRALAX / GLYCOLAX) 17 g packet Take 17 g by mouth daily as needed for mild constipation. 14 each 0   polyethylene glycol (MIRALAX / GLYCOLAX) 17 g packet Take 17 g by mouth daily as needed for mild constipation.     trihexyphenidyl (ARTANE) 2 MG tablet Take 2 mg by mouth 2 (two) times daily.     trihexyphenidyl (ARTANE) 2 MG tablet Take 2 mg by mouth 2 (two) times daily with a meal.     No current  facility-administered medications on file prior to visit.    There are no Patient Instructions on file for this visit. No follow-ups on file.   Kris Hartmann, NP

## 2022-03-09 NOTE — Progress Notes (Signed)
Subjective:    Patient ID: Todd Brown, male    DOB: 10/27/1960, 61 y.o.   MRN: [id number] No chief complaint on file.   Cardioversion returns today for follow-up after his recent fistulogram on 08/26/2021.  The patient has a very aneurysmal fistula.  He has a flow volume of 817 with no areas of significant stenosis.  The patient has previously been planned to have a jump graft revision due to a large aneurysm and the evidence of skin threatening.    Review of Systems  Hematological:  Bruises/bleeds easily.  Psychiatric/Behavioral:  Positive for agitation.   All other systems reviewed and are negative.      Objective:   Physical Exam Vitals reviewed.  HENT:     Head: Normocephalic.  Cardiovascular:     Rate and Rhythm: Normal rate.     Pulses: Normal pulses.     Arteriovenous access: Left arteriovenous access is present.    Comments: Large aneurysmal fistula skin threatening Pulmonary:     Effort: Pulmonary effort is normal.  Skin:    General: Skin is warm and dry.  Neurological:     Mental Status: He is alert and oriented to person, place, and time.  Psychiatric:        Mood and Affect: Mood normal.        Behavior: Behavior normal.        Thought Content: Thought content normal.        Judgment: Judgment normal.     BP (!) 148/95 (BP Location: Right Arm)   Pulse 80   Resp 19   Past Medical History:  Diagnosis Date   Anemia    Dialysis patient ([place])    Mon. 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   ESRD (end stage renal disease) ([place])    ESRD (end stage renal disease) on dialysis ([place])    Hyperlipidemia    Hypertension    Malignant hypertension    Pneumonia 2016   Renal artery stenosis (HCC)    Schizophrenia (HCC)     Social History   Socioeconomic History   Marital status: Single    Spouse name: Not on file   Number of children: 0   Years of education: Not on file   Highest education level:  Not on file  Occupational History   Occupation: Disability   Tobacco Use   Smoking status: Never    Passive exposure: Never   Smokeless tobacco: Never  Vaping Use   Vaping Use: Never used  Substance and Sexual Activity   Alcohol use: Not Currently   Drug use: Not Currently    Types: Marijuana    Comment: not since living at assisted living place 03/2020   Sexual activity: Not Currently  Other Topics Concern   Not on file  Social History Narrative   ** Merged History Encounter **       Living at [facility] assisted [facility] [place], [place] is legal guardian    Social Determinants of Health   Financial Resource Strain: Low Risk  (10/08/2018)   Overall Financial Resource Strain (CARDIA)    Difficulty of Paying Living Expenses: Not hard at all  Food Insecurity: No Food Insecurity (10/08/2018)   Hunger Vital Sign    Worried About Running Out of Food in the Last Year: Never true    Ran Out of Food in the Last Year: Never true  Transportation  Needs: No Transportation Needs (10/08/2018)   PRAPARE - Hydrologist (Medical): No    Lack of Transportation (Non-Medical): No  Physical Activity: Unknown (10/08/2018)   Exercise Vital Sign    Days of Exercise per Week: Patient refused    Minutes of Exercise per Session: Patient refused  Stress: No Stress Concern Present (10/08/2018)   [place]    Feeling of Stress : Not at all  Social Connections: Unknown (10/08/2018)   Social Connection and Isolation Panel [NHANES]    Frequency of Communication with Friends and Family: Patient refused    Frequency of Social Gatherings with Friends and Family: Patient refused    Attends Religious Services: Patient refused    Active Member of Clubs or Organizations: Patient refused    Attends Archivist Meetings: Patient refused    Marital Status: Patient refused  Intimate  Partner Violence: Unknown (10/08/2018)   Humiliation, Afraid, Rape, and Kick questionnaire    Fear of Current or Ex-Partner: Patient refused    Emotionally Abused: Patient refused    Physically Abused: Patient refused    Sexually Abused: Patient refused    Past Surgical History:  Procedure Laterality Date   A/V FISTULAGRAM N/A 08/06/2016   Procedure: A/V Fistulagram;  Surgeon: Algernon Huxley, MD;  Location: [place] [facility];  Service: Cardiovascular;  Laterality: N/A;   A/V FISTULAGRAM N/A 02/14/2021   Procedure: A/V FISTULAGRAM poss Perm Cath Insertion;  Surgeon: Katha Cabal, MD;  Location: [place] [facility];  Service: Cardiovascular;  Laterality: N/A;   A/V FISTULAGRAM Left 08/26/2021   Procedure: A/V Fistulagram;  Surgeon: Algernon Huxley, MD;  Location: [place] [facility];  Service: Cardiovascular;  Laterality: Left;   A/V SHUNT INTERVENTION N/A 08/06/2016   Procedure: A/V Shunt Intervention;  Surgeon: Algernon Huxley, MD;  Location: [place] [facility];  Service: Cardiovascular;  Laterality: N/A;   AV FISTULA PLACEMENT Left 12/26/2014   Procedure: ARTERIOVENOUS (AV) FISTULA CREATION;  Surgeon: Algernon Huxley, MD;  Location: [facility];  Service: Vascular;  Laterality: Left;   AV FISTULA PLACEMENT     ESOPHAGOGASTRODUODENOSCOPY (EGD) WITH PROPOFOL N/A 05/06/2017   Procedure: ESOPHAGOGASTRODUODENOSCOPY (EGD) WITH PROPOFOL;  Surgeon: Jonathon Bellows, MD;  Location: [facility];  Service: Gastroenterology;  Laterality: N/A;   INSERTION OF DIALYSIS CATHETER Right    PERIPHERAL VASCULAR CATHETERIZATION N/A 10/22/2014   Procedure: Dialysis/Perma Catheter Insertion;  Surgeon: Algernon Huxley, MD;  Location: [place] [facility];  Service: Cardiovascular;  Laterality: N/A;   PERIPHERAL VASCULAR CATHETERIZATION N/A 02/28/2015   Procedure: Dialysis/Perma Catheter Removal;  Surgeon: Algernon Huxley, MD;  Location: [place] [facility];  Service: Cardiovascular;  Laterality: N/A;   Repair fx left lower leg      yrs ago (age 59)   TONSILLECTOMY      Family History  Problem Relation Age of Onset   Kidney disease Brother    Diabetes Neg Hx     Allergies  Allergen Reactions   Chlorpromazine Other (See Comments)    Reaction:  Unknown , pt states it makes him feel real bad Reaction:  Unknown , pt states it makes him feel real bad        Latest Ref Rng & Units 02/16/2022    4:32 AM 02/13/2022    7:41 AM 02/10/2022    6:13 AM  CBC  WBC 4.0 - 10.5 K/uL 4.7  3.3  3.7  Hemoglobin 13.0 - 17.0 g/dL 10.5  12.5  10.8   Hematocrit 39.0 - 52.0 % 31.4  36.6  31.7   Platelets 150 - 400 K/uL 169  175  152       CMP     Component Value Date/Time   NA 134 (L) 02/21/2022 0728   K 4.4 02/21/2022 0728   CL 94 (L) 02/21/2022 0728   CO2 27 02/21/2022 0728   GLUCOSE 76 02/21/2022 0728   BUN 74 (H) 02/21/2022 0728   CREATININE 6.05 (H) 02/21/2022 0728   CREATININE 3.47 (H) 03/23/2013 1501   CALCIUM 8.9 02/21/2022 0728   CALCIUM 8.1 (L) 10/22/2014 0504   PROT 6.8 12/15/2021 1203   ALBUMIN 3.3 (L) 02/16/2022 0432   AST 28 12/15/2021 1203   ALT 20 12/15/2021 1203   ALKPHOS 61 12/15/2021 1203   BILITOT 0.7 12/15/2021 1203   GFRNONAA 10 (L) 02/21/2022 0728   GFRAA 7 (L) 10/23/2019 0405     No results found.     Assessment & Plan:   1. End stage renal disease ([place]) Had discussion with the patient and his attendant about aneurysmal fistula   Discussed the procedure with the patient as well as the risk benefits and alternatives.  We discussed placement of a jump graft with removal ligation of his fistula.  The patient would also need PermCath placement during this time.  The patient is currently a ward of DSS.  We have contacted him for consent.  We will move once consent is obtained.  2. Essential hypertension, benign Continue antihypertensive medications as already ordered, these medications have been reviewed and there are no changes at this time.   3. Chronic obstructive pulmonary  disease, unspecified COPD type ([place]) Continue pulmonary medications and aerosols as already ordered, these medications have been reviewed and there are no changes at this time.     Current Outpatient Medications on File Prior to Visit  Medication Sig Dispense Refill   acetaminophen (TYLENOL) 500 MG tablet Take 500-1,000 mg by mouth every 6 (six) hours as needed for mild pain or moderate pain.     acetaminophen (TYLENOL) 500 MG tablet Take 500-1,000 mg by mouth every 6 (six) hours as needed for mild pain.     albuterol (VENTOLIN HFA) 108 (90 Base) MCG/ACT inhaler Inhale 1-2 puffs into the lungs every 4 (four) hours as needed for wheezing or shortness of breath. Shake well     albuterol (VENTOLIN HFA) 108 (90 Base) MCG/ACT inhaler Inhale 1-2 puffs into the lungs every 4 (four) hours as needed for wheezing or shortness of breath.     amLODipine (NORVASC) 10 MG tablet Take 10 mg by mouth every morning.     b complex-vitamin c-folic acid (NEPHRO-VITE) 0.8 MG TABS tablet Take 1 tablet by mouth at bedtime.     b complex-vitamin c-folic acid (NEPHRO-VITE) 0.8 MG TABS tablet Take 1 tablet by mouth at bedtime.     calcium acetate (PHOSLO) 667 MG capsule Take 2 capsules (1,334 mg total) by mouth 3 (three) times daily with meals. 60 capsule 1   calcium acetate (PHOSLO) 667 MG capsule Take 1,334 mg by mouth 3 (three) times daily with meals.     carbamazepine (TEGRETOL) 200 MG tablet Take 1,000 mg by mouth in the morning.     carbamazepine (TEGRETOL) 200 MG tablet Take 1,000 mg by mouth every morning.     cholecalciferol (VITAMIN D) 1000 UNITS tablet Take 1,000 Units by mouth daily.  cholecalciferol (VITAMIN D3) 25 MCG (1000 UNIT) tablet Take 1,000 Units by mouth daily.     cloNIDine (CATAPRES - DOSED IN MG/24 HR) 0.2 mg/24hr patch Place 1 patch (0.2 mg total) onto the skin once a week. 4 patch 0   cloNIDine (CATAPRES) 0.3 MG tablet Take 0.3 mg by mouth 3 (three) times daily.     doxazosin (CARDURA) 4 MG  tablet Take 1 tablet (4 mg total) by mouth at bedtime. 30 tablet 0   ferrous sulfate 325 (65 FE) MG tablet Take 1 tablet (325 mg total) by mouth daily. 30 tablet 3   ferrous sulfate 325 (65 FE) MG tablet Take 325 mg by mouth daily with breakfast.     fluPHENAZine (PROLIXIN) 2.5 MG tablet Take 2.5 mg by mouth daily.     fluPHENAZine (PROLIXIN) 2.5 MG tablet Take 2.5 mg by mouth daily.     fluticasone-salmeterol (ADVAIR) 250-50 MCG/ACT AEPB Inhale 1 puff into the lungs in the morning and at bedtime.     Fluticasone-Salmeterol (ADVAIR) 250-50 MCG/DOSE AEPB Inhale 1 puff into the lungs 2 (two) times daily. Rinse mouth after use discard 30 days after opening     hydrALAZINE (APRESOLINE) 100 MG tablet Take 1 tablet (100 mg total) by mouth 3 (three) times daily.     irbesartan (AVAPRO) 300 MG tablet Take 1 tablet (300 mg total) by mouth daily. 30 tablet 0   isosorbide mononitrate (IMDUR) 60 MG 24 hr tablet Take 1 tablet (60 mg total) by mouth 2 (two) times daily. 60 tablet 1   isosorbide mononitrate (IMDUR) 60 MG 24 hr tablet Take 60 mg by mouth 2 (two) times daily.     losartan (COZAAR) 100 MG tablet Take 1 tablet (100 mg total) by mouth daily. 30 tablet 0   meloxicam (MOBIC) 7.5 MG tablet Take 7.5 mg by mouth daily.     minoxidil (LONITEN) 2.5 MG tablet Take 1 tablet (2.5 mg total) by mouth daily. 30 tablet 0   omeprazole (PRILOSEC) 40 MG capsule Take 1 capsule (40 mg total) by mouth daily. 30 capsule 1   omeprazole (PRILOSEC) 40 MG capsule Take 40 mg by mouth every morning.     polyethylene glycol (MIRALAX / GLYCOLAX) 17 g packet Take 17 g by mouth daily as needed for mild constipation. 14 each 0   polyethylene glycol (MIRALAX / GLYCOLAX) 17 g packet Take 17 g by mouth daily as needed for mild constipation.     trihexyphenidyl (ARTANE) 2 MG tablet Take 2 mg by mouth 2 (two) times daily.     trihexyphenidyl (ARTANE) 2 MG tablet Take 2 mg by mouth 2 (two) times daily with a meal.     No current  facility-administered medications on file prior to visit.    There are no Patient Instructions on file for this visit. No follow-ups on file.   Kris Hartmann, NP

## 2022-03-18 ENCOUNTER — Other Ambulatory Visit (INDEPENDENT_AMBULATORY_CARE_PROVIDER_SITE_OTHER): Payer: Self-pay | Admitting: Nurse Practitioner

## 2022-03-18 ENCOUNTER — Other Ambulatory Visit: Payer: Self-pay

## 2022-03-18 ENCOUNTER — Encounter
Admission: RE | Admit: 2022-03-18 | Discharge: 2022-03-18 | Disposition: A | Discharge status: Home / Self Care | Payer: Medicaid Other | Source: Ambulatory Visit | Attending: Vascular Surgery | Admitting: Vascular Surgery

## 2022-03-18 DIAGNOSIS — N186 End stage renal disease: Secondary | ICD-10-CM

## 2022-03-18 DIAGNOSIS — E875 Hyperkalemia: Secondary | ICD-10-CM

## 2022-03-18 DIAGNOSIS — Z01812 Encounter for preprocedural laboratory examination: Secondary | ICD-10-CM

## 2022-03-18 HISTORY — DX: Chronic obstructive pulmonary disease, unspecified: J44.9

## 2022-03-18 HISTORY — DX: Chronic respiratory failure, unspecified whether with hypoxia or hypercapnia: J96.10

## 2022-03-18 HISTORY — DX: Unspecified osteoarthritis, unspecified site: M19.90

## 2022-03-18 NOTE — Patient Instructions (Signed)
Your procedure is scheduled on: 03/26/22 - Thursday Report to the Registration Desk on the 1st floor of the [facility]. To find out your arrival time, please call [phone] between 1PM - 3PM on: 03/25/22 - Wednesday If your arrival time is 6:00 am, do not arrive prior to that time as the [facility] entrance doors do not open until 6:00 am.  REMEMBER: Instructions that are not followed completely may result in serious medical risk, up to and including death; or upon the discretion of your surgeon and anesthesiologist your surgery may need to be rescheduled.  Do not eat food or drink any liquids after midnight the night before surgery.  No gum chewing, lozengers or hard candies.   TAKE ONLY THESE MEDICATIONS THE MORNING OF SURGERY WITH A SIPS OF WATER:  - amLODipine (NORVASC)  - carbamazepine (TEGRETOL)  - fluPHENAZine (PROLIXIN)  - Fluticasone-Salmeterol (ADVAIR)  - hydrALAZINE (APRESOLINE)  - isosorbide mononitrate (IMDUR)  - minoxidil (LONITEN)  - omeprazole (PRILOSEC)  - trihexyphenidyl (ARTANE)  - cloNIDine (CATAPRES)  Use inhaler albuterol (VENTOLIN  on the day of surgery and bring to the hospital.  One week prior to surgery: meloxicam (MOBIC)  Stop Anti-inflammatories (NSAIDS) such as Advil, Aleve, Ibuprofen, Motrin, Naproxen, Naprosyn and Aspirin based products such as Excedrin, Goodys Powder, BC Powder.  Stop ANY OVER THE COUNTER supplements until after surgery.  You may however, continue to take Tylenol if needed for pain up until the day of surgery.  No Alcohol for 24 hours before or after surgery.  No Smoking including e-cigarettes for 24 hours prior to surgery.  No chewable tobacco products for at least 6 hours prior to surgery.  No nicotine patches on the day of surgery.  Do not use any "recreational" drugs for at least a week prior to your surgery.  Please be advised that the combination of cocaine and anesthesia may have negative outcomes, up to and  including death. If you test positive for cocaine, your surgery will be cancelled.  On the morning of surgery brush your teeth with toothpaste and water, you may rinse your mouth with mouthwash if you wish. Do not swallow any toothpaste or mouthwash.  Use CHG Soap or wipes as directed on instruction sheet.  Do not wear jewelry, make-up, hairpins, clips or nail polish.  Do not wear lotions, powders, or perfumes.   Do not shave body from the neck down 48 hours prior to surgery just in case you cut yourself which could leave a site for infection.  Also, freshly shaved skin may become irritated if using the CHG soap.  Contact lenses, hearing aids and dentures may not be worn into surgery.  Do not bring valuables to the hospital. [facility] is not responsible for any missing/lost belongings or valuables.   Notify your doctor if there is any change in your medical condition (cold, fever, infection).  Wear comfortable clothing (specific to your surgery type) to the hospital.  After surgery, you can help prevent lung complications by doing breathing exercises.  Take deep breaths and cough every 1-2 hours. Your doctor may order a device called an Incentive Spirometer to help you take deep breaths. When coughing or sneezing, hold a pillow firmly against your incision with both hands. This is called "splinting." Doing this helps protect your incision. It also decreases belly discomfort.  If you are being admitted to the hospital overnight, leave your suitcase in the car. After surgery it may be brought to your room.  If you are being discharged the day of surgery, you will not be allowed to drive home. You will need a responsible adult (18 years or older) to drive you home and stay with you that night.   If you are taking public transportation, you will need to have a responsible adult (18 years or older) with you. Please confirm with your physician that it is acceptable to use public  transportation.   Please call the [facility] Dept. at [phone] if you have any questions about these instructions.  Surgery Visitation Policy:  Patients undergoing a surgery or procedure may have two family members or support persons with them as long as the person is not COVID-19 positive or experiencing its symptoms.   Inpatient Visitation:    Visiting hours are 7 a.m. to 8 p.m. Up to four visitors are allowed at one time in a patient room, including children. The visitors may rotate out with other people during the day. One designated support person (adult) may remain overnight.

## 2022-03-18 NOTE — Pre-Procedure Instructions (Addendum)
Multiple attempts made to reach a caregiver in order to complete patient PAT appointment, message was left for his DSS legal guardian- Todd Brown, Group home called multiple times with message left for a return call. Will continue to reach out. I was able to reach [name] - administrator of facility, did a 3 - way call with him and Miss Todd Brown who is patients caregiver at facility. PAT interview was partially completed as I will not get the patients latest MAR until 03/19/22 per Mr. Sowa. Once I receive that information I will fax to Miss. Todd Brown the patients surgery instructions that I partially reviewed today.  Received a call from Todd Brown, made her aware of patients procedure and date, will fax over consent to be signed by Mrs. Todd Brown - legal guardian for patient. This Probation officer will update phone numbers in the patients chart that was supplied by DSS- Todd Brown who is Mrs Dispensing optician.

## 2022-03-19 NOTE — Pre-Procedure Instructions (Signed)
Received patients lastest MAR from [facility], medications retified, Instructions faxed to Miss. Shirlee Limerick as instructed by Melida Quitter.Myriam Jacobson faxed to [facility] as instructed.

## 2022-03-26 NOTE — Pre-Procedure Instructions (Signed)
Signed consent received from [place].

## 2022-03-27 ENCOUNTER — Encounter: Payer: Self-pay | Admitting: Anesthesiology

## 2022-03-27 ENCOUNTER — Encounter
Admission: RE | Disposition: A | Discharge status: Home / Self Care | Payer: Self-pay | Source: Home / Self Care | Attending: Vascular Surgery

## 2022-03-27 ENCOUNTER — Ambulatory Visit: Payer: Medicaid Other | Admitting: Urgent Care

## 2022-03-27 ENCOUNTER — Other Ambulatory Visit: Payer: Self-pay

## 2022-03-27 ENCOUNTER — Ambulatory Visit: Payer: Medicaid Other

## 2022-03-27 ENCOUNTER — Ambulatory Visit: Payer: Self-pay | Admitting: Anesthesiology

## 2022-03-27 ENCOUNTER — Ambulatory Visit
Admission: RE | Admit: 2022-03-27 | Discharge: 2022-03-27 | Disposition: A | Discharge status: Home / Self Care | Payer: Medicaid Other | Attending: Vascular Surgery | Admitting: Vascular Surgery

## 2022-03-27 ENCOUNTER — Encounter: Payer: Self-pay | Admitting: Vascular Surgery

## 2022-03-27 DIAGNOSIS — Z992 Dependence on renal dialysis: Secondary | ICD-10-CM | POA: Diagnosis not present

## 2022-03-27 DIAGNOSIS — T82898A Other specified complication of vascular prosthetic devices, implants and grafts, initial encounter: Secondary | ICD-10-CM

## 2022-03-27 DIAGNOSIS — N186 End stage renal disease: Secondary | ICD-10-CM | POA: Diagnosis not present

## 2022-03-27 DIAGNOSIS — D759 Disease of blood and blood-forming organs, unspecified: Secondary | ICD-10-CM | POA: Insufficient documentation

## 2022-03-27 DIAGNOSIS — T82510A Breakdown (mechanical) of surgically created arteriovenous fistula, initial encounter: Secondary | ICD-10-CM | POA: Diagnosis present

## 2022-03-27 DIAGNOSIS — I120 Hypertensive chronic kidney disease with stage 5 chronic kidney disease or end stage renal disease: Secondary | ICD-10-CM | POA: Diagnosis not present

## 2022-03-27 DIAGNOSIS — Y832 Surgical operation with anastomosis, bypass or graft as the cause of abnormal reaction of the patient, or of later complication, without mention of misadventure at the time of the procedure: Secondary | ICD-10-CM | POA: Diagnosis not present

## 2022-03-27 DIAGNOSIS — J449 Chronic obstructive pulmonary disease, unspecified: Secondary | ICD-10-CM | POA: Diagnosis not present

## 2022-03-27 DIAGNOSIS — Z01812 Encounter for preprocedural laboratory examination: Secondary | ICD-10-CM

## 2022-03-27 DIAGNOSIS — E875 Hyperkalemia: Secondary | ICD-10-CM

## 2022-03-27 DIAGNOSIS — D649 Anemia, unspecified: Secondary | ICD-10-CM | POA: Insufficient documentation

## 2022-03-27 HISTORY — PX: REVISON OF ARTERIOVENOUS FISTULA: SHX6074

## 2022-03-27 LAB — POCT I-STAT, CHEM 8
BUN: 28 mg/dL — ABNORMAL HIGH (ref 8–23)
Calcium, Ion: 1.19 mmol/L (ref 1.15–1.40)
Chloride: 98 mmol/L (ref 98–111)
Creatinine, Ser: 5.1 mg/dL — ABNORMAL HIGH (ref 0.61–1.24)
Glucose, Bld: 74 mg/dL (ref 70–99)
HCT: 32 % — ABNORMAL LOW (ref 39.0–52.0)
Hemoglobin: 10.9 g/dL — ABNORMAL LOW (ref 13.0–17.0)
Potassium: 4.1 mmol/L (ref 3.5–5.1)
Sodium: 139 mmol/L (ref 135–145)
TCO2: 30 mmol/L (ref 22–32)

## 2022-03-27 LAB — POTASSIUM: Potassium: 4.1 mmol/L (ref 3.5–5.1)

## 2022-03-27 LAB — TYPE AND SCREEN
ABO/RH(D): O POS
Antibody Screen: NEGATIVE

## 2022-03-27 SURGERY — REVISON OF ARTERIOVENOUS FISTULA
Anesthesia: General | Site: Arm Lower | Laterality: Left

## 2022-03-27 MED ORDER — 0.9 % SODIUM CHLORIDE (POUR BTL) OPTIME
TOPICAL | Status: DC | PRN
  Administered 2022-03-27: 500 mL

## 2022-03-27 MED ORDER — MIDAZOLAM HCL 2 MG/2ML IJ SOLN
1.0000 mg | Freq: Once | INTRAMUSCULAR | Status: AC
  Administered 2022-03-27: 1 mg via INTRAVENOUS

## 2022-03-27 MED ORDER — HYDRALAZINE HCL 20 MG/ML IJ SOLN
INTRAMUSCULAR | Status: AC
  Administered 2022-03-27: 10 mg via INTRAVENOUS
  Filled 2022-03-27: qty 1

## 2022-03-27 MED ORDER — KETAMINE HCL 50 MG/5ML IJ SOSY
PREFILLED_SYRINGE | INTRAMUSCULAR | Status: AC
  Filled 2022-03-27: qty 5

## 2022-03-27 MED ORDER — SODIUM CHLORIDE (PF) 0.9 % IJ SOLN
INTRAMUSCULAR | Status: AC
  Filled 2022-03-27: qty 10

## 2022-03-27 MED ORDER — MIDAZOLAM HCL 2 MG/2ML IJ SOLN
INTRAMUSCULAR | Status: AC
  Administered 2022-03-27: 1 mg via INTRAVENOUS
  Filled 2022-03-27: qty 2

## 2022-03-27 MED ORDER — ONDANSETRON HCL 4 MG/2ML IJ SOLN
INTRAMUSCULAR | Status: DC | PRN
  Administered 2022-03-27: 4 mg via INTRAVENOUS

## 2022-03-27 MED ORDER — BUPIVACAINE HCL (PF) 0.5 % IJ SOLN
INTRAMUSCULAR | Status: AC
  Filled 2022-03-27: qty 10

## 2022-03-27 MED ORDER — PROPOFOL 10 MG/ML IV BOLUS
INTRAVENOUS | Status: DC | PRN
  Administered 2022-03-27: 50 mg via INTRAVENOUS
  Administered 2022-03-27: 30 mg via INTRAVENOUS
  Administered 2022-03-27 (×2): 20 mg via INTRAVENOUS

## 2022-03-27 MED ORDER — GLYCOPYRROLATE 0.2 MG/ML IJ SOLN
INTRAMUSCULAR | Status: DC | PRN
  Administered 2022-03-27: .2 mg via INTRAVENOUS

## 2022-03-27 MED ORDER — HYDRALAZINE HCL 20 MG/ML IJ SOLN: 10.0000 mg | Freq: Once | INTRAMUSCULAR | Status: AC

## 2022-03-27 MED ORDER — LABETALOL HCL 5 MG/ML IV SOLN
INTRAVENOUS | Status: DC | PRN
  Administered 2022-03-27: 2.5 mg via INTRAVENOUS

## 2022-03-27 MED ORDER — MIDAZOLAM HCL 2 MG/2ML IJ SOLN
INTRAMUSCULAR | Status: AC
  Filled 2022-03-27: qty 2

## 2022-03-27 MED ORDER — HYDRALAZINE HCL 20 MG/ML IJ SOLN
5.0000 mg | Freq: Once | INTRAMUSCULAR | Status: AC
  Administered 2022-03-27: 5 mg via INTRAVENOUS
  Filled 2022-03-27: qty 0.25

## 2022-03-27 MED ORDER — HYDRALAZINE HCL 20 MG/ML IJ SOLN
INTRAMUSCULAR | Status: AC
  Filled 2022-03-27: qty 1

## 2022-03-27 MED ORDER — ACETAMINOPHEN 10 MG/ML IV SOLN
INTRAVENOUS | Status: AC
  Filled 2022-03-27: qty 100

## 2022-03-27 MED ORDER — ACETAMINOPHEN 10 MG/ML IV SOLN
INTRAVENOUS | Status: DC | PRN
  Administered 2022-03-27: 1000 mg via INTRAVENOUS

## 2022-03-27 MED ORDER — CEFAZOLIN SODIUM-DEXTROSE 2-4 GM/100ML-% IV SOLN
INTRAVENOUS | Status: AC
  Filled 2022-03-27: qty 100

## 2022-03-27 MED ORDER — EPHEDRINE SULFATE (PRESSORS) 50 MG/ML IJ SOLN
INTRAMUSCULAR | Status: DC | PRN
  Administered 2022-03-27 (×4): 5 mg via INTRAVENOUS

## 2022-03-27 MED ORDER — BUPIVACAINE HCL (PF) 0.5 % IJ SOLN
INTRAMUSCULAR | Status: DC | PRN
  Administered 2022-03-27: 10 mL via PERINEURAL
  Administered 2022-03-27: 5 mL via PERINEURAL

## 2022-03-27 MED ORDER — PROPOFOL 500 MG/50ML IV EMUL
INTRAVENOUS | Status: DC | PRN
  Administered 2022-03-27: 145 ug/kg/min via INTRAVENOUS

## 2022-03-27 MED ORDER — PHENYLEPHRINE 80 MCG/ML (10ML) SYRINGE FOR IV PUSH (FOR BLOOD PRESSURE SUPPORT)
PREFILLED_SYRINGE | INTRAVENOUS | Status: DC | PRN
  Administered 2022-03-27 (×2): 160 ug via INTRAVENOUS
  Administered 2022-03-27 (×3): 80 ug via INTRAVENOUS

## 2022-03-27 MED ORDER — HEPARIN SODIUM (PORCINE) 5000 UNIT/ML IJ SOLN
INTRAMUSCULAR | Status: AC
  Filled 2022-03-27: qty 1

## 2022-03-27 MED ORDER — SODIUM CHLORIDE 0.9 % IV SOLN
INTRAVENOUS | Status: DC | PRN
  Administered 2022-03-27: 501 mL

## 2022-03-27 MED ORDER — DEXMEDETOMIDINE HCL IN NACL 200 MCG/50ML IV SOLN
INTRAVENOUS | Status: DC | PRN
  Administered 2022-03-27: 8 ug via INTRAVENOUS
  Administered 2022-03-27: 12 ug via INTRAVENOUS

## 2022-03-27 MED ORDER — CEFAZOLIN SODIUM-DEXTROSE 2-4 GM/100ML-% IV SOLN
2.0000 g | INTRAVENOUS | Status: AC
  Administered 2022-03-27: 2 g via INTRAVENOUS

## 2022-03-27 MED ORDER — HYDROCODONE-ACETAMINOPHEN 5-325 MG PO TABS: 1.0000 | ORAL_TABLET | Freq: Four times a day (QID) | ORAL | 0 refills | Status: DC | PRN

## 2022-03-27 MED ORDER — PHENYLEPHRINE HCL (PRESSORS) 10 MG/ML IV SOLN
INTRAVENOUS | Status: AC
  Filled 2022-03-27: qty 1

## 2022-03-27 MED ORDER — PHENYLEPHRINE HCL-NACL 20-0.9 MG/250ML-% IV SOLN
INTRAVENOUS | Status: AC
  Filled 2022-03-27: qty 250

## 2022-03-27 MED ORDER — CHLORHEXIDINE GLUCONATE CLOTH 2 % EX PADS
6.0000 | MEDICATED_PAD | Freq: Once | CUTANEOUS | Status: AC
  Administered 2022-03-27: 6 via TOPICAL

## 2022-03-27 MED ORDER — CHLORHEXIDINE GLUCONATE 0.12 % MT SOLN
OROMUCOSAL | Status: AC
  Administered 2022-03-27: 15 mL via OROMUCOSAL
  Filled 2022-03-27: qty 15

## 2022-03-27 MED ORDER — FENTANYL CITRATE PF 50 MCG/ML IJ SOSY
PREFILLED_SYRINGE | INTRAMUSCULAR | Status: AC
  Administered 2022-03-27: 50 ug via INTRAVENOUS
  Filled 2022-03-27: qty 1

## 2022-03-27 MED ORDER — LIDOCAINE HCL (PF) 2 % IJ SOLN
INTRAMUSCULAR | Status: DC | PRN
  Administered 2022-03-27: 200 mg via PERINEURAL

## 2022-03-27 MED ORDER — FENTANYL CITRATE PF 50 MCG/ML IJ SOSY: 50.0000 ug | PREFILLED_SYRINGE | Freq: Once | INTRAMUSCULAR | Status: AC

## 2022-03-27 MED ORDER — SODIUM CHLORIDE 0.9 % IV SOLN
INTRAVENOUS | Status: DC
Start: 2022-03-27 — End: 2022-03-27

## 2022-03-27 MED ORDER — KETAMINE HCL 10 MG/ML IJ SOLN
INTRAMUSCULAR | Status: DC | PRN
  Administered 2022-03-27 (×2): 10 mg via INTRAVENOUS

## 2022-03-27 MED ORDER — FENTANYL CITRATE (PF) 100 MCG/2ML IJ SOLN
INTRAMUSCULAR | Status: AC
  Filled 2022-03-27: qty 2

## 2022-03-27 MED ORDER — HYDRALAZINE HCL 20 MG/ML IJ SOLN: INTRAMUSCULAR | Status: DC | PRN

## 2022-03-27 MED ORDER — HEMOSTATIC AGENTS (NO CHARGE) OPTIME
TOPICAL | Status: DC | PRN
  Administered 2022-03-27: 1 via TOPICAL

## 2022-03-27 MED ORDER — MIDAZOLAM HCL 2 MG/2ML IJ SOLN: 1.0000 mg | Freq: Once | INTRAMUSCULAR | Status: AC

## 2022-03-27 MED ORDER — CHLORHEXIDINE GLUCONATE 0.12 % MT SOLN: 15.0000 mL | Freq: Once | OROMUCOSAL | Status: AC

## 2022-03-27 MED ORDER — CHLORHEXIDINE GLUCONATE CLOTH 2 % EX PADS: 6.0000 | MEDICATED_PAD | Freq: Once | CUTANEOUS | Status: DC

## 2022-03-27 MED ORDER — ORAL CARE MOUTH RINSE: 15.0000 mL | Freq: Once | OROMUCOSAL | Status: AC

## 2022-03-27 MED ORDER — DEXAMETHASONE SODIUM PHOSPHATE 10 MG/ML IJ SOLN
INTRAMUSCULAR | Status: DC | PRN
  Administered 2022-03-27: 10 mg via INTRAVENOUS

## 2022-03-27 SURGICAL SUPPLY — 69 items
ADH SKN CLS APL DERMABOND .7 (GAUZE/BANDAGES/DRESSINGS) ×2
APL PRP STRL LF DISP 70% ISPRP (MISCELLANEOUS) ×1
APPLIER CLIP 11 MED OPEN (CLIP)
APPLIER CLIP 9.375 SM OPEN (CLIP)
APR CLP MED 11 20 MLT OPN (CLIP)
APR CLP SM 9.3 20 MLT OPN (CLIP)
BAG DECANTER FOR FLEXI CONT (MISCELLANEOUS) ×1 IMPLANT
BLADE SURG 15 STRL LF DISP TIS (BLADE) ×1 IMPLANT
BLADE SURG 15 STRL SS (BLADE) ×1
BLADE SURG SZ11 CARB STEEL (BLADE) ×1 IMPLANT
BOOT SUTURE AID YELLOW STND (SUTURE) ×1 IMPLANT
BRUSH SCRUB EZ  4% CHG (MISCELLANEOUS) ×1
BRUSH SCRUB EZ 4% CHG (MISCELLANEOUS) ×1 IMPLANT
CHLORAPREP W/TINT 26 (MISCELLANEOUS) ×1 IMPLANT
CLIP APPLIE 11 MED OPEN (CLIP) IMPLANT
CLIP APPLIE 9.375 SM OPEN (CLIP) IMPLANT
DERMABOND ADVANCED .7 DNX12 (GAUZE/BANDAGES/DRESSINGS) IMPLANT
DRESSING SURGICEL FIBRLLR 1X2 (HEMOSTASIS) ×1 IMPLANT
DRSG SURGICEL FIBRILLAR 1X2 (HEMOSTASIS) ×1
ELECT CAUTERY BLADE 6.4 (BLADE) ×1 IMPLANT
ELECT REM PT RETURN 9FT ADLT (ELECTROSURGICAL) ×1
ELECTRODE REM PT RTRN 9FT ADLT (ELECTROSURGICAL) ×1 IMPLANT
GLOVE SURG SYN 7.0 (GLOVE) ×1 IMPLANT
GLOVE SURG SYN 7.0 PF PI (GLOVE) ×1 IMPLANT
GLOVE SURG SYN 8.0 (GLOVE) ×1 IMPLANT
GLOVE SURG SYN 8.0 PF PI (GLOVE) ×1 IMPLANT
GOWN STRL REUS W/ TWL LRG LVL3 (GOWN DISPOSABLE) ×2 IMPLANT
GOWN STRL REUS W/ TWL XL LVL3 (GOWN DISPOSABLE) ×1 IMPLANT
GOWN STRL REUS W/TWL LRG LVL3 (GOWN DISPOSABLE) ×2
GOWN STRL REUS W/TWL XL LVL3 (GOWN DISPOSABLE) ×1
GRAFT COLLAGEN VASCULAR 8X45 (Miscellaneous) IMPLANT
IV NS 500ML (IV SOLUTION) ×1
IV NS 500ML BAXH (IV SOLUTION) ×1 IMPLANT
KIT TURNOVER KIT A (KITS) ×1 IMPLANT
LABEL OR SOLS (LABEL) ×1 IMPLANT
LOOP RED MAXI  1X406MM (MISCELLANEOUS) ×1
LOOP VESSEL MAXI 1X406 RED (MISCELLANEOUS) ×1 IMPLANT
LOOP VESSEL MINI 0.8X406 BLUE (MISCELLANEOUS) ×2 IMPLANT
LOOPS BLUE MINI 0.8X406MM (MISCELLANEOUS) ×2
MANIFOLD NEPTUNE II (INSTRUMENTS) ×1 IMPLANT
NDL FILTER BLUNT 18X1 1/2 (NEEDLE) ×1 IMPLANT
NEEDLE FILTER BLUNT 18X1 1/2 (NEEDLE) ×1 IMPLANT
NS IRRIG 500ML POUR BTL (IV SOLUTION) ×1 IMPLANT
PACK EXTREMITY ARMC (MISCELLANEOUS) ×1 IMPLANT
PAD PREP 24X41 OB/GYN DISP (PERSONAL CARE ITEMS) ×1 IMPLANT
SPIKE FLUID TRANSFER (MISCELLANEOUS) ×1 IMPLANT
STOCKINETTE 48X4 2 PLY STRL (GAUZE/BANDAGES/DRESSINGS) ×1 IMPLANT
STOCKINETTE STRL 4IN 9604848 (GAUZE/BANDAGES/DRESSINGS) ×1 IMPLANT
SUT ETHIBOND CT1 BRD #0 30IN (SUTURE) IMPLANT
SUT MNCRL+ 5-0 UNDYED PC-3 (SUTURE) ×1 IMPLANT
SUT MONOCRYL 5-0 (SUTURE) ×3
SUT PROLENE 5-0 (SUTURE) ×3
SUT PROLENE 5-0 BB 24X2 ARM (SUTURE) ×1
SUT PROLENE 5-0 BB 36X2 ARM (SUTURE) ×2
SUT PROLENE 6 0 BV (SUTURE) ×4 IMPLANT
SUT SILK 2 0 (SUTURE)
SUT SILK 2-0 18XBRD TIE 12 (SUTURE) ×1 IMPLANT
SUT SILK 3 0 (SUTURE)
SUT SILK 3-0 18XBRD TIE 12 (SUTURE) ×1 IMPLANT
SUT SILK 4 0 (SUTURE)
SUT SILK 4-0 18XBRD TIE 12 (SUTURE) ×1 IMPLANT
SUT VIC AB 3-0 SH 27 (SUTURE) ×4
SUT VIC AB 3-0 SH 27X BRD (SUTURE) ×2 IMPLANT
SUTURE PROLEN 5-0 BB 24X2 ARM (SUTURE) IMPLANT
SUTURE PROLEN 5-0 BB 36X2 ARM (SUTURE) IMPLANT
SYR 20ML LL LF (SYRINGE) ×1 IMPLANT
SYR 3ML LL SCALE MARK (SYRINGE) ×1 IMPLANT
TRAP FLUID SMOKE EVACUATOR (MISCELLANEOUS) ×1 IMPLANT
WATER STERILE IRR 500ML POUR (IV SOLUTION) ×1 IMPLANT

## 2022-03-27 NOTE — Anesthesia Procedure Notes (Signed)
Anesthesia Regional Block: Supraclavicular block (with axillary ring block)   Pre-Anesthetic Checklist: , timeout performed,  Correct Patient, Correct Site, Correct Laterality,  Correct Procedure, Correct Position, site marked,  Risks and benefits discussed,  Surgical consent,  Pre-op evaluation,  At surgeon's request and post-op pain management  Laterality: Upper and Left  Prep: chloraprep       Needles:  Injection technique: Single-shot  Needle Type: Stimiplex     Needle Length: 9cm  Needle Gauge: 22     Additional Needles:   Procedures:,,,, ultrasound used (permanent image in chart),,    Narrative:  Start time: 03/27/2022 10:28 AM End time: 03/27/2022 10:33 AM Injection made incrementally with aspirations every 5 mL.  Performed by: Personally  Anesthesiologist: Iran Ouch, MD  Additional Notes: Patient consented for risk and benefits of nerve block including but not limited to nerve damage, failed block, bleeding and infection.  Patient voiced understanding.  Functioning IV was confirmed and monitors were applied.  Timeout done prior to procedure and prior to any sedation being given to the patient.  Patient confirmed procedure site prior to any sedation given to the patient. Sterile prep,hand hygiene and sterile gloves were used.  Minimal sedation used for procedure.  No paresthesia endorsed by patient during the procedure.  Negative aspiration and negative test dose prior to incremental administration of local anesthetic. The patient tolerated the procedure well with no immediate complications.

## 2022-03-27 NOTE — Op Note (Signed)
OPERATIVE NOTE    PROCEDURE: 1.  Revision left brachial cephalic fistula to a brachial axillary Artegraft 2.  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  The right internal jugular vein appears to be occluded at the confluence with the subclavian and innominate veins.  Left internal jugular vein and central veins on the left appear widely patent  SPECIMEN(S): The resected aneurysmal fistula en bloc to pathology  INDICATIONS:   Todd Brown is a 61 y.o. male who presents with end stage renal disease.  The patient is scheduled for left fistula resection with creation of a AV graft for continued access.  The patient is aware the risks include but are not limited to: bleeding, infection, steal syndrome, nerve damage, ischemic monomelic neuropathy, failure to mature, and need for additional procedures.  The patient is aware of the risks of the procedure and elects to proceed forward.    DESCRIPTION: After full informed written consent was obtained from the patient, the patient was brought back to the operating room and placed supine upon the operating table.  Prior to induction, the patient received IV antibiotics.   After obtaining adequate anesthesia, the patient was then prepped and draped in the standard fashion for a left arm access procedure.   A first assistant was required to provide a safe and appropriate environment for executing the surgery.  The assistant was integral in providing retraction, exposure, running suture providing suction and in the closing process.    A linear incision was then created along the medial  border of the aneurysmal portion of the left cephalic vein.  The dissection was carried down through the skin and the soft fatty tissues to expose the aneurysmal vein itself.  Dissection was carried from the level of the brachial artery up to the proximal cephalic vein at the level of the mid deltoid muscle.  Once the medial side of the aneurysmal vein is dissected circumferentially and hemostasis achieved with Bovie cautery a complementary incision was made along the lateral aspect creating an elliptical segment of skin associated with the aneurysm.  Again a lateral incision was carried down to expose the aneurysm itself through the skin and soft tissues bleeding was controlled with Bovie cautery.  Several venous tributaries were encountered and these were ligated with 201 3-0 silk ties and divided.  The aneurysmal segment was then dissected circumferentially in its entirety.   The arterial anastomosis was then dissected out circumferentially so that a profunda clamp could be passed across the anastomotic area for proximal control.  This segment of the cephalic vein was also marked with surgical marker for orientation.  Likewise at the more proximal deltoid level the vein was dissected circumferentially where it was normal in contour and non-aneurysmal, the vein was then marked with a surgical marker in that surgical with a vessel loop.   The aneurysm was then ligated with 2-0 silk proximally distally profunda clamp was placed across the arterial anastomotic area and an angled Fogarty clamp across the proximal vein.  The aneurysm was then transected proximally distally and passed off the field en bloc. The wound was then inspected for hemostasis quarter percent Marcaine with epinephrine was infused into the  skin edges and soft tissues.  Once the wound was completely dry the subcutaneous tissues are reapproximated with running 3-0 Vicryl.     The Gore tunneler was then delivered onto the field and a  subcutaneous tunnel was made from the arterial incision to the venous incision. A 7 millimeter Artegraft was then pulled through the subcutaneous tunnel.  The Artegraft was marked with a surgical marker medication prior to pulling it through the newly created lateral tunnel.  The anastomotic area and the Artegraft were then approximated to each other this was a good match end-to-end anastomosis was fashioned with running 5-0 Prolene.  Flushing maneuvers were performed the graft was flushed and then it was irrigated and clamped just above the anastomosis.  The proximal vein and the distal end of the Artegraft was then approximated this was also a good match and an end-to-end graft to vein anastomosis was fashioned using running 5-0 Prolene.  Flushing maneuvers were performed.  Flow was then established through the AV graft.  Excellent thrill was noted in the graft.   At this point, I irrigated out the surgical wounds.  There was no further active bleeding.  The subcutaneous tissue was reapproximated with a running stitch of 3-0 Vicryl.  The skin was then reapproximated with a running subcuticular stitch of 4-0 Monocryl.  The skin was then cleaned, dried, and reinforced with Dermabond.   The patient tolerated this procedure well.    COMPLICATIONS: None   CONDITION: [facility]  Office: [phone]   03/27/2022, 2:13 PM

## 2022-03-27 NOTE — Progress Notes (Signed)
Pt BP 144/122. Rechecks with the same Blood pressures of 140's over 120's. Dr. Wynetta Emery notified. Acknowledged. Orders received.

## 2022-03-27 NOTE — Interval H&P Note (Signed)
History and Physical Interval Note:  03/27/2022 10:22 AM  [name]  has presented today for surgery, with the diagnosis of ESRD.  The various methods of treatment have been discussed with the patient and family. After consideration of risks, benefits and other options for treatment, the patient has consented to  Procedure(s): REVISON OF ARTERIOVENOUS FISTULA (JUMP GRAFT) (Left) as a surgical intervention.  The patient's history has been reviewed, patient examined, no change in status, stable for surgery.  I have reviewed the patient's chart and labs.  Questions were answered to the patient's satisfaction.     Todd Brown

## 2022-03-27 NOTE — Anesthesia Preprocedure Evaluation (Addendum)
Anesthesia Evaluation   Patient awake    Reviewed: Allergy & Precautions, NPO status , Patient's Chart, lab work & pertinent test results  Airway Mallampati: III  TM Distance: >3 FB Neck ROM: full    Dental  (+) Edentulous Upper, Edentulous Lower   Pulmonary pneumonia, resolved, COPD, Current Smoker and Patient abstained from smoking.,    Pulmonary exam normal        Cardiovascular hypertension, Pt. on medications + Peripheral Vascular Disease  Normal cardiovascular exam  Cardiac arrest 2018   Neuro/Psych PSYCHIATRIC DISORDERS Schizophrenia negative neurological ROS     GI/Hepatic Neg liver ROS, GERD  ,  Endo/Other  negative endocrine ROS  Renal/GU Dialysis and CRFRenal disease     Musculoskeletal   Abdominal   Peds  Hematology  (+) Blood dyscrasia, anemia ,   Anesthesia Other Findings Past Medical History: No date: Anemia No date: Dialysis patient [facility])     Comment:  Mon. 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 No date: ESRD (end stage renal disease) ([place]) No date: Hyperlipidemia No date: Malignant hypertension 2016: Pneumonia No date: Renal artery stenosis (HCC) No date: Schizophrenia [facility])  Past Surgical History: 08/06/2016: A/V FISTULAGRAM; N/A     Comment:  Procedure: A/V Fistulagram;  Surgeon: Algernon Huxley, MD;                Location: [facility];  Service: Cardiovascular;              Laterality: N/A; 08/06/2016: A/V SHUNT INTERVENTION; N/A     Comment:  Procedure: A/V Shunt Intervention;  Surgeon: Algernon Huxley, MD;  Location: [facility] [facility];  Service:               Cardiovascular;  Laterality: N/A; 12/26/2014: AV FISTULA PLACEMENT; Left     Comment:  Procedure: ARTERIOVENOUS (AV) FISTULA CREATION;                Surgeon: Algernon Huxley, MD;  Location: [facility] ORS;  Service:               Vascular;   Laterality: Left; 05/06/2017: ESOPHAGOGASTRODUODENOSCOPY (EGD) WITH PROPOFOL; N/A     Comment:  Procedure: ESOPHAGOGASTRODUODENOSCOPY (EGD) WITH               PROPOFOL;  Surgeon: Jonathon Bellows, MD;  Location: [facility]               ENDOSCOPY;  Service: Gastroenterology;  Laterality: N/A; No date: INSERTION OF DIALYSIS CATHETER; Right 10/22/2014: PERIPHERAL VASCULAR CATHETERIZATION; N/A     Comment:  Procedure: Dialysis/Perma Catheter Insertion;  Surgeon:               Algernon Huxley, MD;  Location: [facility];                Service: Cardiovascular;  Laterality: N/A; 02/28/2015: PERIPHERAL VASCULAR CATHETERIZATION; N/A     Comment:  Procedure: Dialysis/Perma Catheter Removal;  Surgeon:               Algernon Huxley, MD;  Location: [place];                Service: Cardiovascular;  Laterality: N/A; No date: Repair fx left lower leg     Comment:  yrs ago (age 61) No  date: TONSILLECTOMY  BMI    Body Mass Index: 19.37 kg/m      Reproductive/Obstetrics negative OB ROS                            Anesthesia Physical  Anesthesia Plan  ASA: 3  Anesthesia Plan:    Post-op Pain Management:    Induction:   PONV Risk Score and Plan: 2 and Ondansetron, Dexamethasone and Treatment may vary due to age or medical condition  Airway Management Planned:   Additional Equipment:   Intra-op Plan:   Post-operative Plan:   Informed Consent:     Dental Advisory Given  Plan Discussed with: Anesthesiologist, CRNA and Surgeon  Anesthesia Plan Comments: (Patient consented for risks of anesthesia including but not limited to:  - adverse reactions to medications - damage to eyes, teeth, lips or other oral mucosa - nerve damage due to positioning  - sore throat or hoarseness - Damage to heart, brain, nerves, lungs, other parts of body or loss of life  Patient voiced understanding.)        Anesthesia Quick Evaluation

## 2022-03-27 NOTE — Transfer of Care (Signed)
Immediate Anesthesia Transfer of Care Note  Patient: Todd Brown  Procedure(s) Performed: REVISON OF ARTERIOVENOUS FISTULA (JUMP GRAFT) (Left: Arm Lower)  Patient Location: PACU  Anesthesia Type:General  Level of Consciousness: drowsy and patient cooperative  Airway & Oxygen Therapy: Patient Spontanous Breathing and Patient connected to face mask oxygen  Post-op Assessment: Report given to RN and Post -op Vital signs reviewed and stable  Post vital signs: Reviewed and stable  Last Vitals:  Vitals Value Taken Time  BP 101/78 03/27/22 1403  Temp 36.7 C 03/27/22 1403  Pulse 65 03/27/22 1410  Resp 14 03/27/22 1410  SpO2 100 % 03/27/22 1410  Vitals shown include unvalidated device data.  Last Pain:  Vitals:   03/27/22 1403  TempSrc:   PainSc: Asleep         Complications: No notable events documented.

## 2022-03-27 NOTE — Discharge Instructions (Signed)

## 2022-03-27 NOTE — Anesthesia Procedure Notes (Addendum)
Procedure Name: General with mask airway Date/Time: 03/27/2022 10:48 AM  Performed by: Kelton Pillar, CRNAPre-anesthesia Checklist: Patient identified, Emergency Drugs available, Suction available and Patient being monitored Patient Re-evaluated:Patient Re-evaluated prior to induction Oxygen Delivery Method: Simple face mask Induction Type: IV induction Ventilation: Oral airway inserted - appropriate to patient size Placement Confirmation: positive ETCO2, CO2 detector and breath sounds checked- equal and bilateral Dental Injury: Teeth and Oropharynx as per pre-operative assessment

## 2022-03-30 ENCOUNTER — Encounter: Payer: Self-pay | Admitting: Vascular Surgery

## 2022-03-31 LAB — SURGICAL PATHOLOGY

## 2022-03-31 NOTE — Anesthesia Postprocedure Evaluation (Signed)
Anesthesia Post Note  Patient: Todd Brown  Procedure(s) Performed: REVISON OF ARTERIOVENOUS FISTULA (JUMP GRAFT) (Left: Arm Lower)  Patient location during evaluation: PACU Anesthesia Type: General Level of consciousness: awake and alert Pain management: pain level controlled Vital Signs Assessment: post-procedure vital signs reviewed and stable Respiratory status: spontaneous breathing, nonlabored ventilation and respiratory function stable Cardiovascular status: blood pressure returned to baseline and stable Postop Assessment: no apparent nausea or vomiting Anesthetic complications: no   No notable events documented.   Last Vitals:  Vitals:   03/27/22 1524 03/27/22 1613  BP: 125/62 (!) 125/58  Pulse: 88   Resp: 16   Temp: 36.6 C   SpO2: 93% 93%    Last Pain:  Vitals:   03/27/22 1524  TempSrc: Temporal  PainSc: 0-No pain                 Iran Ouch

## 2022-04-10 ENCOUNTER — Other Ambulatory Visit (INDEPENDENT_AMBULATORY_CARE_PROVIDER_SITE_OTHER): Payer: Self-pay | Admitting: Vascular Surgery

## 2022-04-10 DIAGNOSIS — Z992 Dependence on renal dialysis: Secondary | ICD-10-CM

## 2022-04-17 ENCOUNTER — Ambulatory Visit (INDEPENDENT_AMBULATORY_CARE_PROVIDER_SITE_OTHER): Payer: Medicaid Other | Admitting: Nurse Practitioner

## 2022-04-17 ENCOUNTER — Encounter (INDEPENDENT_AMBULATORY_CARE_PROVIDER_SITE_OTHER): Payer: Medicaid Other

## 2022-05-14 ENCOUNTER — Ambulatory Visit (INDEPENDENT_AMBULATORY_CARE_PROVIDER_SITE_OTHER): Payer: Medicaid Other | Admitting: Nurse Practitioner

## 2022-05-14 ENCOUNTER — Encounter (INDEPENDENT_AMBULATORY_CARE_PROVIDER_SITE_OTHER): Payer: Self-pay | Admitting: Nurse Practitioner

## 2022-05-14 ENCOUNTER — Ambulatory Visit (INDEPENDENT_AMBULATORY_CARE_PROVIDER_SITE_OTHER): Payer: Medicaid Other

## 2022-05-14 VITALS — BP 120/59 | HR 74 | Resp 16

## 2022-05-14 DIAGNOSIS — N186 End stage renal disease: Secondary | ICD-10-CM

## 2022-05-14 DIAGNOSIS — Z992 Dependence on renal dialysis: Secondary | ICD-10-CM

## 2022-05-20 ENCOUNTER — Emergency Department: Payer: Medicaid Other

## 2022-05-20 ENCOUNTER — Observation Stay: Payer: Medicaid Other

## 2022-05-20 ENCOUNTER — Other Ambulatory Visit: Payer: Self-pay

## 2022-05-20 ENCOUNTER — Inpatient Hospital Stay
Admission: EM | Admit: 2022-05-20 | Discharge: 2022-05-28 | DRG: 177 | Payer: Medicaid Other | Source: Skilled Nursing Facility | Attending: Internal Medicine | Admitting: Internal Medicine

## 2022-05-20 ENCOUNTER — Encounter: Payer: Self-pay | Admitting: Emergency Medicine

## 2022-05-20 DIAGNOSIS — Z8701 Personal history of pneumonia (recurrent): Secondary | ICD-10-CM

## 2022-05-20 DIAGNOSIS — I701 Atherosclerosis of renal artery: Secondary | ICD-10-CM | POA: Diagnosis present

## 2022-05-20 DIAGNOSIS — Z888 Allergy status to other drugs, medicaments and biological substances status: Secondary | ICD-10-CM

## 2022-05-20 DIAGNOSIS — N2581 Secondary hyperparathyroidism of renal origin: Secondary | ICD-10-CM | POA: Diagnosis present

## 2022-05-20 DIAGNOSIS — T465X5A Adverse effect of other antihypertensive drugs, initial encounter: Secondary | ICD-10-CM | POA: Diagnosis present

## 2022-05-20 DIAGNOSIS — Z992 Dependence on renal dialysis: Secondary | ICD-10-CM

## 2022-05-20 DIAGNOSIS — I120 Hypertensive chronic kidney disease with stage 5 chronic kidney disease or end stage renal disease: Secondary | ICD-10-CM | POA: Diagnosis present

## 2022-05-20 DIAGNOSIS — U071 COVID-19: Secondary | ICD-10-CM | POA: Diagnosis not present

## 2022-05-20 DIAGNOSIS — J9621 Acute and chronic respiratory failure with hypoxia: Secondary | ICD-10-CM | POA: Diagnosis present

## 2022-05-20 DIAGNOSIS — E8779 Other fluid overload: Secondary | ICD-10-CM | POA: Diagnosis present

## 2022-05-20 DIAGNOSIS — J441 Chronic obstructive pulmonary disease with (acute) exacerbation: Secondary | ICD-10-CM | POA: Diagnosis present

## 2022-05-20 DIAGNOSIS — N186 End stage renal disease: Secondary | ICD-10-CM | POA: Diagnosis not present

## 2022-05-20 DIAGNOSIS — R531 Weakness: Secondary | ICD-10-CM

## 2022-05-20 DIAGNOSIS — F209 Schizophrenia, unspecified: Secondary | ICD-10-CM | POA: Diagnosis present

## 2022-05-20 DIAGNOSIS — D631 Anemia in chronic kidney disease: Secondary | ICD-10-CM | POA: Diagnosis present

## 2022-05-20 DIAGNOSIS — Z91158 Patient's noncompliance with renal dialysis for other reason: Secondary | ICD-10-CM

## 2022-05-20 DIAGNOSIS — Z9981 Dependence on supplemental oxygen: Secondary | ICD-10-CM

## 2022-05-20 DIAGNOSIS — Z86718 Personal history of other venous thrombosis and embolism: Secondary | ICD-10-CM

## 2022-05-20 DIAGNOSIS — M1990 Unspecified osteoarthritis, unspecified site: Secondary | ICD-10-CM | POA: Diagnosis present

## 2022-05-20 DIAGNOSIS — N189 Chronic kidney disease, unspecified: Secondary | ICD-10-CM | POA: Diagnosis present

## 2022-05-20 DIAGNOSIS — Z841 Family history of disorders of kidney and ureter: Secondary | ICD-10-CM

## 2022-05-20 DIAGNOSIS — J811 Chronic pulmonary edema: Secondary | ICD-10-CM | POA: Diagnosis present

## 2022-05-20 DIAGNOSIS — R0902 Hypoxemia: Secondary | ICD-10-CM | POA: Diagnosis present

## 2022-05-20 DIAGNOSIS — E875 Hyperkalemia: Secondary | ICD-10-CM | POA: Diagnosis present

## 2022-05-20 DIAGNOSIS — E785 Hyperlipidemia, unspecified: Secondary | ICD-10-CM | POA: Diagnosis present

## 2022-05-20 DIAGNOSIS — Z79899 Other long term (current) drug therapy: Secondary | ICD-10-CM

## 2022-05-20 LAB — CBC
HCT: 26.8 % — ABNORMAL LOW (ref 39.0–52.0)
Hemoglobin: 8.6 g/dL — ABNORMAL LOW (ref 13.0–17.0)
MCH: 33.1 pg (ref 26.0–34.0)
MCHC: 32.1 g/dL (ref 30.0–36.0)
MCV: 103.1 fL — ABNORMAL HIGH (ref 80.0–100.0)
Platelets: 173 10*3/uL (ref 150–400)
RBC: 2.6 MIL/uL — ABNORMAL LOW (ref 4.22–5.81)
RDW: 16.9 % — ABNORMAL HIGH (ref 11.5–15.5)
WBC: 7.7 10*3/uL (ref 4.0–10.5)
nRBC: 0 % (ref 0.0–0.2)

## 2022-05-20 LAB — RESP PANEL BY RT-PCR (RSV, FLU A&B, COVID)  RVPGX2
Influenza A by PCR: NEGATIVE
Influenza B by PCR: NEGATIVE
Resp Syncytial Virus by PCR: NEGATIVE
SARS Coronavirus 2 by RT PCR: POSITIVE — AB

## 2022-05-20 LAB — PHOSPHORUS: Phosphorus: 3.7 mg/dL (ref 2.5–4.6)

## 2022-05-20 LAB — COMPREHENSIVE METABOLIC PANEL
ALT: 22 U/L (ref 0–44)
AST: 41 U/L (ref 15–41)
Albumin: 3.9 g/dL (ref 3.5–5.0)
Alkaline Phosphatase: 103 U/L (ref 38–126)
Anion gap: 13 (ref 5–15)
BUN: 53 mg/dL — ABNORMAL HIGH (ref 8–23)
CO2: 28 mmol/L (ref 22–32)
Calcium: 10 mg/dL (ref 8.9–10.3)
Chloride: 96 mmol/L — ABNORMAL LOW (ref 98–111)
Creatinine, Ser: 7.48 mg/dL — ABNORMAL HIGH (ref 0.61–1.24)
GFR, Estimated: 8 mL/min — ABNORMAL LOW (ref >60)
Glucose, Bld: 79 mg/dL (ref 70–99)
Potassium: 6.8 mmol/L (ref 3.5–5.1)
Sodium: 137 mmol/L (ref 135–145)
Total Bilirubin: 1.2 mg/dL (ref 0.3–1.2)
Total Protein: 7.7 g/dL (ref 6.5–8.1)

## 2022-05-20 LAB — CBG MONITORING, ED
Glucose-Capillary: 105 mg/dL — ABNORMAL HIGH (ref 70–99)
Glucose-Capillary: 60 mg/dL — ABNORMAL LOW (ref 70–99)
Glucose-Capillary: 84 mg/dL (ref 70–99)

## 2022-05-20 LAB — IRON AND TIBC
Iron: 25 ug/dL — ABNORMAL LOW (ref 45–182)
Saturation Ratios: 11 % — ABNORMAL LOW (ref 17.9–39.5)
TIBC: 223 ug/dL — ABNORMAL LOW (ref 250–450)
UIBC: 198 ug/dL

## 2022-05-20 LAB — HEPATITIS B SURFACE ANTIGEN: Hepatitis B Surface Ag: NONREACTIVE

## 2022-05-20 LAB — MAGNESIUM: Magnesium: 2.2 mg/dL (ref 1.7–2.4)

## 2022-05-20 LAB — TROPONIN I (HIGH SENSITIVITY)
Troponin I (High Sensitivity): 33 ng/L — ABNORMAL HIGH (ref <18)
Troponin I (High Sensitivity): 34 ng/L — ABNORMAL HIGH (ref <18)

## 2022-05-20 MED ORDER — ZINC SULFATE 220 (50 ZN) MG PO CAPS
220.0000 mg | ORAL_CAPSULE | Freq: Every day | ORAL | Status: DC
  Administered 2022-05-20 – 2022-05-28 (×9): 220 mg via ORAL
  Filled 2022-05-20 (×9): qty 1

## 2022-05-20 MED ORDER — DOXAZOSIN MESYLATE 4 MG PO TABS
4.0000 mg | ORAL_TABLET | Freq: Every day | ORAL | Status: DC
  Administered 2022-05-20 – 2022-05-27 (×8): 4 mg via ORAL
  Filled 2022-05-20 (×9): qty 1

## 2022-05-20 MED ORDER — DEXTROSE 50 % IV SOLN
1.0000 | Freq: Once | INTRAVENOUS | Status: AC
  Administered 2022-05-20: 50 mL via INTRAVENOUS
  Filled 2022-05-20: qty 50

## 2022-05-20 MED ORDER — GUAIFENESIN-DM 100-10 MG/5ML PO SYRP
10.0000 mL | ORAL_SOLUTION | ORAL | Status: DC | PRN
  Administered 2022-05-21 – 2022-05-28 (×9): 10 mL via ORAL
  Filled 2022-05-20 (×9): qty 10

## 2022-05-20 MED ORDER — PANTOPRAZOLE SODIUM 40 MG PO TBEC
40.0000 mg | DELAYED_RELEASE_TABLET | Freq: Every day | ORAL | Status: DC
  Administered 2022-05-21 – 2022-05-28 (×8): 40 mg via ORAL
  Filled 2022-05-20 (×8): qty 1

## 2022-05-20 MED ORDER — VITAMIN D 25 MCG (1000 UNIT) PO TABS
1000.0000 [IU] | ORAL_TABLET | Freq: Every day | ORAL | Status: DC
  Administered 2022-05-21 – 2022-05-28 (×8): 1000 [IU] via ORAL
  Filled 2022-05-20 (×8): qty 1

## 2022-05-20 MED ORDER — HYDRALAZINE HCL 50 MG PO TABS
100.0000 mg | ORAL_TABLET | Freq: Three times a day (TID) | ORAL | Status: DC
  Administered 2022-05-20 – 2022-05-28 (×20): 100 mg via ORAL
  Filled 2022-05-20 (×26): qty 2

## 2022-05-20 MED ORDER — LIDOCAINE HCL (PF) 1 % IJ SOLN: 5.0000 mL | INTRAMUSCULAR | Status: DC | PRN

## 2022-05-20 MED ORDER — ALBUTEROL SULFATE HFA 108 (90 BASE) MCG/ACT IN AERS
2.0000 | INHALATION_SPRAY | Freq: Four times a day (QID) | RESPIRATORY_TRACT | Status: DC
  Administered 2022-05-21 – 2022-05-28 (×27): 2 via RESPIRATORY_TRACT
  Filled 2022-05-20 (×3): qty 6.7

## 2022-05-20 MED ORDER — ALBUTEROL SULFATE (2.5 MG/3ML) 0.083% IN NEBU: 2.5000 mg | INHALATION_SOLUTION | Freq: Four times a day (QID) | RESPIRATORY_TRACT | Status: DC | PRN

## 2022-05-20 MED ORDER — ISOSORBIDE MONONITRATE ER 60 MG PO TB24
60.0000 mg | ORAL_TABLET | Freq: Two times a day (BID) | ORAL | Status: DC
  Administered 2022-05-20 – 2022-05-28 (×15): 60 mg via ORAL
  Filled 2022-05-20: qty 1
  Filled 2022-05-20: qty 2
  Filled 2022-05-20 (×4): qty 1
  Filled 2022-05-20: qty 2
  Filled 2022-05-20 (×3): qty 1
  Filled 2022-05-20 (×2): qty 2
  Filled 2022-05-20: qty 1
  Filled 2022-05-20: qty 2
  Filled 2022-05-20: qty 1
  Filled 2022-05-20: qty 2

## 2022-05-20 MED ORDER — VITAMIN C 500 MG PO TABS
500.0000 mg | ORAL_TABLET | Freq: Every day | ORAL | Status: DC
  Administered 2022-05-20 – 2022-05-28 (×9): 500 mg via ORAL
  Filled 2022-05-20 (×9): qty 1

## 2022-05-20 MED ORDER — HEPARIN SODIUM (PORCINE) 1000 UNIT/ML DIALYSIS: 1000.0000 [IU] | INTRAMUSCULAR | Status: DC | PRN

## 2022-05-20 MED ORDER — SODIUM CHLORIDE 0.9 % IV SOLN: 250.0000 mL | INTRAVENOUS | Status: DC | PRN

## 2022-05-20 MED ORDER — CARBAMAZEPINE 200 MG PO TABS
1000.0000 mg | ORAL_TABLET | Freq: Every morning | ORAL | Status: DC
  Administered 2022-05-21 – 2022-05-28 (×8): 1000 mg via ORAL
  Filled 2022-05-20 (×8): qty 5

## 2022-05-20 MED ORDER — LIDOCAINE-PRILOCAINE 2.5-2.5 % EX CREA: 1.0000 | TOPICAL_CREAM | CUTANEOUS | Status: DC | PRN

## 2022-05-20 MED ORDER — CHLORHEXIDINE GLUCONATE CLOTH 2 % EX PADS
6.0000 | MEDICATED_PAD | Freq: Every day | CUTANEOUS | Status: DC
  Administered 2022-05-20 – 2022-05-28 (×7): 6 via TOPICAL
  Filled 2022-05-20 (×3): qty 6

## 2022-05-20 MED ORDER — POLYETHYLENE GLYCOL 3350 17 G PO PACK: 17.0000 g | PACK | Freq: Every day | ORAL | Status: DC | PRN

## 2022-05-20 MED ORDER — INSULIN ASPART 100 UNIT/ML IV SOLN
5.0000 [IU] | Freq: Once | INTRAVENOUS | Status: DC
  Filled 2022-05-20: qty 0.05

## 2022-05-20 MED ORDER — MOMETASONE FURO-FORMOTEROL FUM 200-5 MCG/ACT IN AERO
2.0000 | INHALATION_SPRAY | Freq: Two times a day (BID) | RESPIRATORY_TRACT | Status: DC
  Administered 2022-05-21 – 2022-05-28 (×15): 2 via RESPIRATORY_TRACT
  Filled 2022-05-20 (×4): qty 8.8

## 2022-05-20 MED ORDER — RENA-VITE PO TABS
1.0000 | ORAL_TABLET | Freq: Every day | ORAL | Status: DC
  Administered 2022-05-22 – 2022-05-27 (×7): 1 via ORAL
  Filled 2022-05-20 (×7): qty 1

## 2022-05-20 MED ORDER — HYDROMORPHONE HCL 1 MG/ML IJ SOLN: 1.0000 mg | Freq: Once | INTRAMUSCULAR | Status: DC

## 2022-05-20 MED ORDER — FERROUS SULFATE 325 (65 FE) MG PO TABS
325.0000 mg | ORAL_TABLET | Freq: Every day | ORAL | Status: DC
  Administered 2022-05-21 – 2022-05-28 (×8): 325 mg via ORAL
  Filled 2022-05-20 (×8): qty 1

## 2022-05-20 MED ORDER — HYDROCODONE-ACETAMINOPHEN 5-325 MG PO TABS: 1.0000 | ORAL_TABLET | Freq: Four times a day (QID) | ORAL | Status: DC | PRN

## 2022-05-20 MED ORDER — AMLODIPINE BESYLATE 10 MG PO TABS
10.0000 mg | ORAL_TABLET | Freq: Every morning | ORAL | Status: DC
  Administered 2022-05-21 – 2022-05-28 (×7): 10 mg via ORAL
  Filled 2022-05-20 (×4): qty 1
  Filled 2022-05-20: qty 2
  Filled 2022-05-20: qty 1
  Filled 2022-05-20: qty 2
  Filled 2022-05-20 (×2): qty 1

## 2022-05-20 MED ORDER — SODIUM ZIRCONIUM CYCLOSILICATE 10 G PO PACK
10.0000 g | PACK | Freq: Once | ORAL | Status: AC
  Administered 2022-05-20: 10 g via ORAL
  Filled 2022-05-20: qty 1

## 2022-05-20 MED ORDER — CALCIUM ACETATE (PHOS BINDER) 667 MG PO CAPS
1334.0000 mg | ORAL_CAPSULE | Freq: Three times a day (TID) | ORAL | Status: DC
  Administered 2022-05-20 – 2022-05-28 (×17): 1334 mg via ORAL
  Filled 2022-05-20 (×21): qty 2

## 2022-05-20 MED ORDER — DEXAMETHASONE SODIUM PHOSPHATE 10 MG/ML IJ SOLN
6.0000 mg | INTRAMUSCULAR | Status: DC
  Administered 2022-05-20: 6 mg via INTRAVENOUS
  Filled 2022-05-20: qty 1

## 2022-05-20 MED ORDER — ONDANSETRON HCL 4 MG/2ML IJ SOLN: 4.0000 mg | Freq: Four times a day (QID) | INTRAMUSCULAR | Status: DC | PRN

## 2022-05-20 MED ORDER — ONDANSETRON HCL 4 MG PO TABS: 4.0000 mg | ORAL_TABLET | Freq: Four times a day (QID) | ORAL | Status: DC | PRN

## 2022-05-20 MED ORDER — ANTICOAGULANT SODIUM CITRATE 4% (200MG/5ML) IV SOLN: 5.0000 mL | Status: DC | PRN

## 2022-05-20 MED ORDER — SODIUM CHLORIDE 0.9% FLUSH
3.0000 mL | Freq: Two times a day (BID) | INTRAVENOUS | Status: DC
  Administered 2022-05-20 (×2): 3 mL via INTRAVENOUS

## 2022-05-20 MED ORDER — MINOXIDIL 2.5 MG PO TABS
2.5000 mg | ORAL_TABLET | Freq: Every day | ORAL | Status: DC
  Administered 2022-05-22 – 2022-05-28 (×6): 2.5 mg via ORAL
  Filled 2022-05-20 (×8): qty 1

## 2022-05-20 MED ORDER — FLUPHENAZINE HCL 5 MG PO TABS
2.5000 mg | ORAL_TABLET | Freq: Every day | ORAL | Status: DC
  Administered 2022-05-22 – 2022-05-28 (×7): 2.5 mg via ORAL
  Filled 2022-05-20 (×9): qty 1

## 2022-05-20 MED ORDER — ALTEPLASE 2 MG IJ SOLR: 2.0000 mg | Freq: Once | INTRAMUSCULAR | Status: DC | PRN

## 2022-05-20 MED ORDER — TRIHEXYPHENIDYL HCL 2 MG PO TABS
2.0000 mg | ORAL_TABLET | Freq: Two times a day (BID) | ORAL | Status: DC
  Administered 2022-05-20 – 2022-05-28 (×15): 2 mg via ORAL
  Filled 2022-05-20 (×16): qty 1

## 2022-05-20 MED ORDER — PENTAFLUOROPROP-TETRAFLUOROETH EX AERO: 1.0000 | INHALATION_SPRAY | CUTANEOUS | Status: DC | PRN

## 2022-05-20 MED ORDER — CLONIDINE HCL 0.2 MG/24HR TD PTWK
0.2000 mg | MEDICATED_PATCH | TRANSDERMAL | Status: DC
  Administered 2022-05-27: 0.2 mg via TRANSDERMAL
  Filled 2022-05-20 (×2): qty 1

## 2022-05-20 MED ORDER — SODIUM CHLORIDE 0.9% FLUSH: 3.0000 mL | INTRAVENOUS | Status: DC | PRN

## 2022-05-20 MED ORDER — CALCIUM GLUCONATE-NACL 1-0.675 GM/50ML-% IV SOLN
1.0000 g | Freq: Once | INTRAVENOUS | Status: AC
  Administered 2022-05-20: 1000 mg via INTRAVENOUS
  Filled 2022-05-20: qty 50

## 2022-05-20 MED ORDER — HEPARIN SODIUM (PORCINE) 5000 UNIT/ML IJ SOLN
5000.0000 [IU] | Freq: Three times a day (TID) | INTRAMUSCULAR | Status: DC
  Administered 2022-05-20 – 2022-05-28 (×22): 5000 [IU] via SUBCUTANEOUS
  Filled 2022-05-20 (×22): qty 1

## 2022-05-20 MED ORDER — ACETAMINOPHEN 500 MG PO TABS: 500.0000 mg | ORAL_TABLET | Freq: Four times a day (QID) | ORAL | Status: DC | PRN

## 2022-05-20 NOTE — Assessment & Plan Note (Addendum)
Patient was brought into the ER for evaluation of generalized weakness and worsening hypoxia His COVID-19 PCR is positive Obtain CT scan of the chest without contrast to rule out COVID-19 pneumonia We will place patient on as needed bronchodilator therapy, systemic steroids, antitussives and vitamins He will need to be placed on isolation precautions

## 2022-05-20 NOTE — Progress Notes (Signed)
Received hbsag from outpatient and S. Breeze, NP. <30 days. Susceptible.

## 2022-05-20 NOTE — Assessment & Plan Note (Signed)
Dialysis days are M/W/F Patient is status post emergent renal replacement therapy

## 2022-05-20 NOTE — Assessment & Plan Note (Signed)
Secondary to end-stage renal disease in the setting of ARB use Discontinue irbesartan Twelve-lead EKG showed no acute findings Patient received calcium gluconate and Lokelma as well as emergent renal replacement therapy Will repeat potassium levels

## 2022-05-20 NOTE — H&P (Addendum)
History and Physical    Patient: Todd Brown:[id number] DOB: 04/04/1961 DOA: 05/20/2022 DOS: the patient was seen and examined on 05/20/2022 PCP: Jodi Marble, MD  Patient coming from: [name]  Chief Complaint:  Chief Complaint  Patient presents with   Weakness   HPI: Todd Brown is a 61 y.o. male with medical history significant for hypertension, COPD with chronic respiratory failure on 2 L of oxygen continuous, history of DVT, end-stage renal disease (M/W/F), schizophrenia who was brought in to the emergency room for evaluation of weakness and hypoxia.  Per the staff at the facility where he resides his pulse oximetry on 2 L was 74% and so they increased him to 3.5 L with improvement in his pulse oximetry to over 92%.  Patient was said to be very weak and unable to get out of bed and so could not go to the dialysis center.  He completed his dialysis session on Monday. I am unable to do a review of systems on this patient due to his mental status. Significant labs include a potassium of 6.8, BUN 53, creatinine 7.48, hemoglobin is 8.6 down from 10.9 from a month ago. Chest x-ray shows mildly worsened interstitial pulmonary edema.  COVID-19 PCR is positive Nephrology was consulted in the ER and patient was taken emergently for renal replacement therapy    Review of Systems: unable to review all systems due to the inability of the patient to answer questions. Past Medical History:  Diagnosis Date   Anemia    Arthritis    Chronic respiratory failure (HCC)    COPD (chronic obstructive pulmonary disease) ([place])    Dialysis patient ([place])    Mon. -Wed.- Fri   DVT (deep venous thrombosis) ([place])    cephalic and basolic vein thrombosis   ESRD (end stage renal disease) ([place])    ESRD (end stage renal disease) on dialysis ([place])    Hyperlipidemia    Hypertension    Malignant hypertension    Pneumonia 2016   Renal artery stenosis (HCC)    Schizophrenia (HCC)     Past Surgical History:  Procedure Laterality Date   A/V FISTULAGRAM N/A 08/06/2016   Procedure: A/V Fistulagram;  Surgeon: Algernon Huxley, MD;  Location: [facility];  Service: Cardiovascular;  Laterality: N/A;   A/V FISTULAGRAM N/A 02/14/2021   Procedure: A/V FISTULAGRAM poss Perm Cath Insertion;  Surgeon: Katha Cabal, MD;  Location: [facility];  Service: Cardiovascular;  Laterality: N/A;   A/V FISTULAGRAM Left 08/26/2021   Procedure: A/V Fistulagram;  Surgeon: Algernon Huxley, MD;  Location: [place];  Service: Cardiovascular;  Laterality: Left;   A/V SHUNT INTERVENTION N/A 08/06/2016   Procedure: A/V Shunt Intervention;  Surgeon: Algernon Huxley, MD;  Location: [place] [facility];  Service: Cardiovascular;  Laterality: N/A;   AV FISTULA PLACEMENT Left 12/26/2014   Procedure: ARTERIOVENOUS (AV) FISTULA CREATION;  Surgeon: Algernon Huxley, MD;  Location: [facility];  Service: Vascular;  Laterality: Left;   AV FISTULA PLACEMENT     ESOPHAGOGASTRODUODENOSCOPY (EGD) WITH PROPOFOL N/A 05/06/2017   Procedure: ESOPHAGOGASTRODUODENOSCOPY (EGD) WITH PROPOFOL;  Surgeon: Jonathon Bellows, MD;  Location: [facility];  Service: Gastroenterology;  Laterality: N/A;   INSERTION OF DIALYSIS CATHETER Right    PERIPHERAL VASCULAR CATHETERIZATION N/A 10/22/2014   Procedure: Dialysis/Perma Catheter Insertion;  Surgeon: Algernon Huxley, MD;  Location: [place] [facility];  Service: Cardiovascular;  Laterality: N/A;   PERIPHERAL VASCULAR CATHETERIZATION  N/A 02/28/2015   Procedure: Dialysis/Perma Catheter Removal;  Surgeon: Algernon Huxley, MD;  Location: [place] [facility];  Service: Cardiovascular;  Laterality: N/A;   Repair fx left lower leg     yrs ago (age 15)   REVISON OF ARTERIOVENOUS FISTULA Left 03/27/2022   Procedure: REVISON OF ARTERIOVENOUS FISTULA (JUMP GRAFT);  Surgeon: Katha Cabal, MD;  Location: [facility];  Service: Vascular;  Laterality: Left;   TONSILLECTOMY     Social  History:  reports that he has never smoked. He has never been exposed to tobacco smoke. He has never used smokeless tobacco. He reports that he does not currently use alcohol. He reports that he does not currently use drugs after having used the following drugs: Marijuana.  Allergies  Allergen Reactions   Chlorpromazine Other (See Comments)    Reaction:  Unknown , pt states it makes him feel real bad Reaction:  Unknown , pt states it makes him feel real bad     Family History  Problem Relation Age of Onset   Kidney disease Brother    Diabetes Neg Hx     Prior to Admission medications   Medication Sig Start Date End Date Taking? Authorizing Provider  amLODipine (NORVASC) 10 MG tablet Take 10 mg by mouth every morning.   Yes [provider]  b complex-vitamin c-folic acid (NEPHRO-VITE) 0.8 MG TABS tablet Take 1 tablet by mouth at bedtime.   Yes [provider]  carbamazepine (TEGRETOL) 200 MG tablet Take 1,000 mg by mouth every morning.   Yes [provider]  cholecalciferol (VITAMIN D) 1000 UNITS tablet Take 1,000 Units by mouth daily.   Yes [provider]  cloNIDine (CATAPRES - DOSED IN MG/24 HR) 0.2 mg/24hr patch Place 1 patch (0.2 mg total) onto the skin once a week. 02/23/22  Yes Sharen Hones, MD  ferrous sulfate 325 (65 FE) MG tablet Take 1 tablet (325 mg total) by mouth daily. 05/19/15  Yes Fritzi Mandes, MD  fluPHENAZine (PROLIXIN) 2.5 MG tablet Take 2.5 mg by mouth daily.   Yes [provider]  irbesartan (AVAPRO) 300 MG tablet Take 1 tablet (300 mg total) by mouth daily. 02/22/22  Yes Sharen Hones, MD  minoxidil (LONITEN) 2.5 MG tablet Take 1 tablet (2.5 mg total) by mouth daily. 02/22/22  Yes Sharen Hones, MD  omeprazole (PRILOSEC) 40 MG capsule Take 40 mg by mouth every morning.   Yes [provider]  acetaminophen (TYLENOL) 500 MG tablet Take 500-1,000 mg by mouth every 6 (six) hours as needed for mild pain or moderate pain.     [provider]  albuterol (VENTOLIN HFA) 108 (90 Base) MCG/ACT inhaler Inhale 1-2 puffs into the lungs every 4 (four) hours as needed for wheezing or shortness of breath.    [provider]  calcium acetate (PHOSLO) 667 MG capsule Take 1,334 mg by mouth 3 (three) times daily with meals.    [provider]  cloNIDine (CATAPRES) 0.3 MG tablet Take 0.3 mg by mouth 3 (three) times daily. Patient not taking: Reported on 03/27/2022    [provider]  doxazosin (CARDURA) 4 MG tablet Take 1 tablet (4 mg total) by mouth at bedtime. 02/21/22   Sharen Hones, MD  Fluticasone-Salmeterol (ADVAIR) 250-50 MCG/DOSE AEPB Inhale 1 puff into the lungs 2 (two) times daily. Rinse mouth after use discard 30 days after opening    [provider]  hydrALAZINE (APRESOLINE) 100 MG tablet Take 1 tablet (100 mg total)  by mouth 3 (three) times daily. 10/16/21   Fritzi Mandes, MD  HYDROcodone-acetaminophen (NORCO) 5-325 MG tablet Take 1-2 tablets by mouth every 6 (six) hours as needed for moderate pain or severe pain. 03/27/22   Schnier, Dolores Lory, MD  isosorbide mononitrate (IMDUR) 60 MG 24 hr tablet Take 60 mg by mouth 2 (two) times daily.    [provider]  losartan (COZAAR) 100 MG tablet Take 1 tablet (100 mg total) by mouth daily. Patient not taking: Reported on 03/27/2022 10/16/21   Fritzi Mandes, MD  OXYGEN Inhale into the lungs. 2 liters continuous    [provider]  polyethylene glycol (MIRALAX / GLYCOLAX) 17 g packet Take 17 g by mouth daily as needed for mild constipation.    [provider]  trihexyphenidyl (ARTANE) 2 MG tablet Take 2 mg by mouth 2 (two) times daily.    [provider]    Physical Exam: Vitals:   05/20/22 1300 05/20/22 1330 05/20/22 1400 05/20/22 1430  BP: (!) 191/92 (!) 184/78 (!) 175/95   Pulse: 80 77 73   Resp: 20 (!) 22 (!) 21   Temp:      TempSrc:      SpO2: 94% 93% 94% 90%  Weight:      Height:        Physical Exam Vitals and nursing note reviewed.  Constitutional:      Comments: Oriented to person  HENT:     Head: Normocephalic and atraumatic.     Nose: Nose normal.     Mouth/Throat:     Mouth: Mucous membranes are moist.  Eyes:     Comments: Pale conjunctiva  Cardiovascular:     Rate and Rhythm: Normal rate and regular rhythm.  Pulmonary:     Effort: Pulmonary effort is normal.     Breath sounds: Rales present.  Abdominal:     General: Abdomen is flat. Bowel sounds are normal.     Palpations: Abdomen is soft.  Musculoskeletal:        General: Normal range of motion.     Cervical back: Normal range of motion and neck supple.  Skin:    General: Skin is warm and dry.  Neurological:     Mental Status: He is alert.     Motor: Weakness present.  Psychiatric:        Mood and Affect: Mood normal.        Behavior: Behavior normal.     Data Reviewed: Relevant notes from primary care and specialist visits, past discharge summaries as available in EHR, including Care Everywhere. Prior diagnostic testing as pertinent to current admission diagnoses Updated medications and problem lists for reconciliation ED course, including vitals, labs, imaging, treatment and response to treatment Triage notes, nursing and pharmacy notes and ED provider's notes Notable results as noted in HPI Labs reviewed.  Troponin 34, sodium 137, potassium 6.8, chloride 96, bicarb 28, glucose 79, BUN 53, creatinine 7.48, calcium 10.0, total protein 7.7, albumin 3.9, AST 41, ALT 22, alkaline phosphatase 183, total bilirubin 1.2, magnesium 2.2, white count 7.7, hemoglobin 8.6, hematocrit 26.8, platelet count 173 Twelve-lead EKG reviewed by me shows sinus rhythm There are no new results to review at this time.  Assessment and Plan: Hyperkalemia Secondary to end-stage renal disease in the setting of ARB use Discontinue irbesartan Twelve-lead EKG showed no acute findings Patient received calcium gluconate  and Lokelma as well as emergent renal replacement therapy Will repeat potassium levels  Hypoxia Patient has COPD  with chronic respiratory failure and is usually on 2 L of oxygen but was noted to be hypoxic on his 2 L with pulse oximetry of 74%. He has an increased oxygen requirement and is currently on 3.5 L with pulse oximetry greater than 92% Hypoxia appears to be multifactorial and related to volume overload from end-stage renal disease as well as from COVID-19 viral infection rule out pneumonia Will obtain CT scan of the chest without contrast for further evaluation Continue oxygen supplementation  ESRD on dialysis [facility]) Dialysis days are M/W/F Patient is status post emergent renal replacement therapy  COVID-19 virus infection Patient was brought into the ER for evaluation of generalized weakness and worsening hypoxia His COVID-19 PCR is positive Obtain CT scan of the chest without contrast to rule out COVID-19 pneumonia We will place patient on as needed bronchodilator therapy, systemic steroids, antitussives and vitamins He will need to be placed on isolation precautions  Anemia due to chronic kidney disease Patient has anemia due to chronic kidney disease but is noted to have a significant drop in his H&H over the last 1 month 10.9 >> 8.6 Obtain iron studies Check stool for Hemoccult  Schizophrenia ([place]) Continue Artane and fluphenazine      Advance Care Planning:   Code Status: Full Code   Consults: Nephrology  Family Communication: Called and spoke to patient's legal guardian Candice Gobble.  All questions and concerns have been addressed.  She verbalizes understanding and agrees to the plan.  CODE STATUS was discussed and patient is a full code.  Severity of Illness: The appropriate patient status for this patient is OBSERVATION. Observation status is judged to be reasonable and necessary in order to provide the required intensity of service to ensure the patient's  safety. The patient's presenting symptoms, physical exam findings, and initial radiographic and laboratory data in the context of their medical condition is felt to place them at decreased risk for further clinical deterioration. Furthermore, it is anticipated that the patient will be medically stable for discharge from the hospital within 2 midnights of admission.   Author: Collier Bullock, MD 05/20/2022 2:44 PM  For on call review [email or web address].

## 2022-05-20 NOTE — Progress Notes (Signed)
Pre hd rn assessment 

## 2022-05-20 NOTE — Progress Notes (Signed)
Notified by [facility] that patient brought to [facility] with tele leads/cords attached, but no cardiac device attached to monitor. [facility] notified of patient arrival to [facility].

## 2022-05-20 NOTE — Assessment & Plan Note (Signed)
Patient has COPD with chronic respiratory failure and is usually on 2 L of oxygen but was noted to be hypoxic on his 2 L with pulse oximetry of 74%. He has an increased oxygen requirement and is currently on 3.5 L with pulse oximetry greater than 92% Hypoxia appears to be multifactorial and related to volume overload from end-stage renal disease as well as from COVID-19 viral infection rule out pneumonia Will obtain CT scan of the chest without contrast for further evaluation Continue oxygen supplementation

## 2022-05-20 NOTE — ED Provider Notes (Signed)
[facility] Provider Note    Event Date/Time   First MD Initiated Contact with Patient 05/20/22 1001     (approximate)   History   Weakness   HPI  Todd Brown is a 61 y.o. male past medical history significant for hypertension, ESRD on HD MWF, confusion at baseline with a legal guardian, 2 L home O2, who presents emergency department with worsening hypoxia and generalized weakness.  Facility at his group home stated that he had worsening generalized weakness today and they were unable to get him out of bed.  Unable to get him to dialysis.  He did complete a dialysis session on Monday.  When EMS arrived patient was hypoxic to 74% on his 2 L nasal cannula they placed him on a nonrebreather.  Patient is unable to provide any further history which EMS states is at his baseline.  No known falls or trauma.  Uncertain if the patient still makes urine.     Physical Exam   Triage Vital Signs: ED Triage Vitals  Enc Vitals Group     BP 05/20/22 1012 (!) 137/109     Pulse Rate 05/20/22 1012 88     Resp 05/20/22 1012 20     Temp 05/20/22 1010 99.3 F (37.4 C)     Temp Source 05/20/22 1010 Oral     SpO2 05/20/22 1012 92 %     Weight 05/20/22 1011 180 lb (81.6 kg)     Height 05/20/22 1011 5\' 11"  (1.803 m)     Head Circumference --      Peak Flow --      Pain Score 05/20/22 1011 0     Pain Loc --      Pain Edu? --      Excl. in [place]? --     Most recent vital signs: Vitals:   05/20/22 1012 05/20/22 1124  BP: (!) 137/109   Pulse: 88   Resp: 20 18  Temp:    SpO2: 92%     Physical Exam Constitutional:      Appearance: He is well-developed. He is ill-appearing.  HENT:     Head: Atraumatic.  Eyes:     Conjunctiva/sclera: Conjunctivae normal.  Cardiovascular:     Rate and Rhythm: Regular rhythm.  Pulmonary:     Effort: Respiratory distress present.     Breath sounds: Rhonchi present.     Comments: Tachypneic, 85% on 2 L nasal cannula, increased to  3.5 L nasal cannula with improvement to 92% crackles throughout all lung fields.  No wheezing. Musculoskeletal:     Cervical back: Normal range of motion.     Comments: AV fistula to the left upper extremity with palpable thrill  Skin:    General: Skin is warm.     Capillary Refill: Capillary refill takes less than 2 seconds.  Neurological:     Mental Status: He is alert. Mental status is at baseline. He is disoriented.     IMPRESSION / MDM / ASSESSMENT AND PLAN / ED COURSE  I reviewed the triage vital signs and the nursing notes.  Differential diagnosis including pneumonia, viral illness, electrolyte abnormality, volume overload from missed dialysis, COPD exacerbation, pulmonary embolism  EKG  I, Nathaniel Man, the attending physician, personally viewed and interpreted this ECG.   Rate: Normal  Rhythm: Normal sinus  Axis: Normal  Intervals: Prolonged QTc 506.  ST&T Change: Nonspecific ST changes noted ST elevation in the inferior leads however disagree nonspecific.  No significant change when compared to prior EKG.  No signs of hyperkalemia  No tachycardic or bradycardic dysrhythmias while on cardiac telemetry.  RADIOLOGY I independently reviewed imaging, my interpretation of imaging: Chest x-ray with pulmonary edema with no focal findings consistent with pneumonia  LABS (all labs ordered are listed, but only abnormal results are displayed) Labs interpreted as -  Worsening anemia with a hemoglobin of 8.6 from baseline of 10 however has had a low hemoglobin in the past, no obvious signs or symptoms of GI bleed.  Potassium significantly elevated at 6.8.  BUN 53 and creatinine 7.5.  Labs Reviewed  CBC - Abnormal; Notable for the following components:      Result Value   RBC 2.60 (*)    Hemoglobin 8.6 (*)    HCT 26.8 (*)    MCV 103.1 (*)    RDW 16.9 (*)    All other components within normal limits  COMPREHENSIVE METABOLIC PANEL - Abnormal; Notable for the following  components:   Potassium 6.8 (*)    Chloride 96 (*)    BUN 53 (*)    Creatinine, Ser 7.48 (*)    GFR, Estimated 8 (*)    All other components within normal limits  TROPONIN I (HIGH SENSITIVITY) - Abnormal; Notable for the following components:   Troponin I (High Sensitivity) 33 (*)    All other components within normal limits  RESP PANEL BY RT-PCR (RSV, FLU A&B, COVID)  RVPGX2  PHOSPHORUS  MAGNESIUM  URINALYSIS, ROUTINE W REFLEX MICROSCOPIC  HEPATITIS B SURFACE ANTIGEN  HEPATITIS B SURFACE ANTIBODY, QUANTITATIVE  HEPATITIS B CORE ANTIBODY, IGM   Consulted nephrology and discussed the patient's case with Dr. Candiss Norse, recommending emergent dialysis.  Patient was temporized with calcium, Lokelma, slightly low glucose on his BMP so we will recheck glucose prior to giving any insulin.  Dialysis team states that the patient will be able to get emergent dialysis within 30 minutes.  Given patient's acute on chronic hypoxic respiratory failure patient will be admitted to the hospitalist, consulted hospitalist for admission.  TREATMENT IV calcium, emergent dialysis     PROCEDURES:  Critical Care performed: Yes  .Critical Care  Performed by: Nathaniel Man, MD Authorized by: Nathaniel Man, MD   Critical care provider statement:    Critical care time (minutes):  45   Critical care time was exclusive of:  Separately billable procedures and treating other patients   Critical care was necessary to treat or prevent imminent or life-threatening deterioration of the following conditions:  Metabolic crisis   Critical care was time spent personally by me on the following activities:  Development of treatment plan with patient or surrogate, discussions with consultants, evaluation of patient's response to treatment, examination of patient, ordering and review of laboratory studies, ordering and review of radiographic studies, ordering and performing treatments and interventions, pulse oximetry,  re-evaluation of patient's condition and review of old charts   Patient's presentation is most consistent with acute presentation with potential threat to life or bodily function.   MEDICATIONS ORDERED IN ED: Medications  sodium zirconium cyclosilicate (LOKELMA) packet 10 g (has no administration in time range)  calcium gluconate 1 g/ 50 mL sodium chloride IVPB (1,000 mg Intravenous New Bag/Given 05/20/22 1128)  insulin aspart (novoLOG) injection 5 Units (has no administration in time range)    And  dextrose 50 % solution 50 mL (has no administration in time range)  Chlorhexidine Gluconate Cloth 2 % PADS 6 each (has no administration in  time range)  pentafluoroprop-tetrafluoroeth (GEBAUERS) aerosol 1 Application (has no administration in time range)  lidocaine (PF) (XYLOCAINE) 1 % injection 5 mL (has no administration in time range)  lidocaine-prilocaine (EMLA) cream 1 Application (has no administration in time range)  heparin injection 1,000 Units (has no administration in time range)  anticoagulant sodium citrate solution 5 mL (has no administration in time range)  alteplase (CATHFLO ACTIVASE) injection 2 mg (has no administration in time range)    FINAL CLINICAL IMPRESSION(S) / ED DIAGNOSES   Final diagnoses:  Weakness  Hyperkalemia  Acute on chronic hypoxic respiratory failure ([place])     Rx / DC Orders   ED Discharge Orders     None        Note:  This document was prepared using Dragon voice recognition software and may include unintentional dictation errors.   Nathaniel Man, MD 05/20/22 [phone]

## 2022-05-20 NOTE — ED Notes (Signed)
Assisted patient with dinner. Ate 100%

## 2022-05-20 NOTE — Assessment & Plan Note (Signed)
Continue Artane and fluphenazine

## 2022-05-20 NOTE — Assessment & Plan Note (Signed)
Patient has anemia due to chronic kidney disease but is noted to have a significant drop in his H&H over the last 1 month 10.9 >> 8.6 Obtain iron studies Check stool for Hemoccult

## 2022-05-20 NOTE — ED Notes (Signed)
Pt to Dialysis at this time 

## 2022-05-20 NOTE — ED Notes (Signed)
Pt's legal guardian Willette Cluster [phone]

## 2022-05-20 NOTE — ED Triage Notes (Signed)
Pt via EMS from [place], this AM has been increasingly weak per facility. Pt has a hx of COPD and wear 2L [place] at baseline. Pt also is ESRD and on dialysis last tx was Monday. Was unable to go today because he was too weak. Pt has a fistula to the R arm. O2 at baseline 88%, pt placed on 3.5L [place]. Staff at [employer] pt at baseline is not usually very responsive. Pt is alert but only oriented to self.

## 2022-05-20 NOTE — Progress Notes (Signed)
[facility]  ROUNDING NOTE   Subjective:   Todd Brown is a 61 year old male with past medical conditions including COPD, hypertension, DVT, schizophrenia, and end-stage renal disease on hemodialysis.  Patient presents to the emergency department with complaints of hypoxia and weakness and has been admitted under observation for Hyperkalemia [E87.5] Weakness [R53.1] Acute on chronic hypoxic respiratory failure ([place]) [J96.21]  Patient is known to our practice and receives outpatient dialysis treatments at [facility] on a MWF schedule, supervised by Dr. Holley Raring.  Last treatment received on Monday.  It was reported from patient's facility that patient was found to be on 2 L oxygen with sats of 74%, improved to 92% when oxygen increased to 3.5 L.  Patient has been unable to get out of bed due to weakness and longer unable to make his scheduled dialysis treatment today.  Patient is seen and evaluated during dialysis.   HEMODIALYSIS FLOWSHEET:  Blood Flow Rate (mL/min): 400 mL/min Arterial Pressure (mmHg): -140 mmHg Venous Pressure (mmHg): 160 mmHg TMP (mmHg): 18 mmHg Ultrafiltration Rate (mL/min): 739 mL/min Dialysate Flow Rate (mL/min): 300 ml/min Dialysis Fluid Bolus: Normal Saline Bolus Amount (mL): 300 mL  Currently denies nausea or vomiting.  Requesting meal tray.  Mild lower extremity edema.   Objective:  Vital signs in last 24 hours:  Temp:  [98.8 F (37.1 C)-99.3 F (37.4 C)] 98.8 F (37.1 C) (12/20 1555) Pulse Rate:  [73-88] 86 (12/20 1555) Resp:  [17-24] 18 (12/20 1555) BP: (137-198)/(78-109) 178/88 (12/20 1555) SpO2:  [90 %-100 %] 94 % (12/20 1555) Weight:  [81.6 kg] 81.6 kg (12/20 1011)  Weight change:  Filed Weights   05/20/22 1011  Weight: 81.6 kg    Intake/Output: No intake/output data recorded.   Intake/Output this shift:  Total I/O In: 120 [P.O.:120] Out: 2000 [Other:2000]  Physical Exam: General: NAD  Head: Normocephalic,  atraumatic. Moist oral mucosal membranes  Eyes: Anicteric  Lungs:  Rhonchi throughout, normal effort  Heart: Regular rate and rhythm  Abdomen:  Soft, nontender,   Extremities: 1+ peripheral edema.  Neurologic: Alert and oriented to self and place  Skin: No lesions  Access: Right chest PermCath    Basic Metabolic Panel: Recent Labs  Lab 05/20/22 1007  NA 137  K 6.8*  CL 96*  CO2 28  GLUCOSE 79  BUN 53*  CREATININE 7.48*  CALCIUM 10.0  MG 2.2  PHOS 3.7    Liver Function Tests: Recent Labs  Lab 05/20/22 1007  AST 41  ALT 22  ALKPHOS 103  BILITOT 1.2  PROT 7.7  ALBUMIN 3.9   No results for input(s): "LIPASE", "AMYLASE" in the last 168 hours. No results for input(s): "AMMONIA" in the last 168 hours.  CBC: Recent Labs  Lab 05/20/22 1007  WBC 7.7  HGB 8.6*  HCT 26.8*  MCV 103.1*  PLT 173    Cardiac Enzymes: No results for input(s): "CKTOTAL", "CKMB", "CKMBINDEX", "TROPONINI" in the last 168 hours.  BNP: Invalid input(s): "POCBNP"  CBG: Recent Labs  Lab 05/20/22 1133 05/20/22 1152  GLUCAP 60* 105*    Microbiology: Results for orders placed or performed during the hospital encounter of 05/20/22  Resp panel by RT-PCR (RSV, Flu A&B, Covid) Anterior Nasal Swab     Status: Abnormal   Collection Time: 05/20/22 10:04 AM   Specimen: Anterior Nasal Swab  Result Value Ref Range Status   SARS Coronavirus 2 by RT PCR POSITIVE (A) NEGATIVE Final    Comment: (NOTE) SARS-CoV-2 target nucleic  acids are DETECTED.  The SARS-CoV-2 RNA is generally detectable in upper respiratory specimens during the acute phase of infection. Positive results are indicative of the presence of the identified virus, but do not rule out bacterial infection or co-infection with other pathogens not detected by the test. Clinical correlation with patient history and other diagnostic information is necessary to determine patient infection status. The expected result is Negative.  Fact  Sheet for Patients: EntrepreneurPulse.com.au  Fact Sheet for Healthcare Providers: IncredibleEmployment.be  This test is not yet approved or cleared by the [place] FDA and  has been authorized for detection and/or diagnosis of SARS-CoV-2 by FDA under an Emergency Use Authorization (EUA).  This EUA will remain in effect (meaning this test can be used) for the duration of  the COVID-19 declaration under Section 564(b)(1) of the A ct, 21 U.S.C. section 360bbb-3(b)(1), unless the authorization is terminated or revoked sooner.     Influenza A by PCR NEGATIVE NEGATIVE Final   Influenza B by PCR NEGATIVE NEGATIVE Final    Comment: (NOTE) The Xpert Xpress SARS-CoV-2/FLU/RSV plus assay is intended as an aid in the diagnosis of influenza from Nasopharyngeal swab specimens and should not be used as a sole basis for treatment. Nasal washings and aspirates are unacceptable for Xpert Xpress SARS-CoV-2/FLU/RSV testing.  Fact Sheet for Patients: EntrepreneurPulse.com.au  Fact Sheet for Healthcare Providers: IncredibleEmployment.be  This test is not yet approved or cleared by the [place] FDA and has been authorized for detection and/or diagnosis of SARS-CoV-2 by FDA under an Emergency Use Authorization (EUA). This EUA will remain in effect (meaning this test can be used) for the duration of the COVID-19 declaration under Section 564(b)(1) of the Act, 21 U.S.C. section 360bbb-3(b)(1), unless the authorization is terminated or revoked.     Resp Syncytial Virus by PCR NEGATIVE NEGATIVE Final    Comment: (NOTE) Fact Sheet for Patients: EntrepreneurPulse.com.au  Fact Sheet for Healthcare Providers: IncredibleEmployment.be  This test is not yet approved or cleared by the [place] FDA and has been authorized for detection and/or diagnosis of SARS-CoV-2 by FDA under an  Emergency Use Authorization (EUA). This EUA will remain in effect (meaning this test can be used) for the duration of the COVID-19 declaration under Section 564(b)(1) of the Act, 21 U.S.C. section 360bbb-3(b)(1), unless the authorization is terminated or revoked.  Performed at [facility], [address] [place], [place] [address]     Coagulation Studies: No results for input(s): "LABPROT", "INR" in the last 72 hours.  Urinalysis: No results for input(s): "COLORURINE", "LABSPEC", "PHURINE", "GLUCOSEU", "HGBUR", "BILIRUBINUR", "KETONESUR", "PROTEINUR", "UROBILINOGEN", "NITRITE", "LEUKOCYTESUR" in the last 72 hours.  Invalid input(s): "APPERANCEUR"    Imaging: CT Head Wo Contrast  Result Date: 05/20/2022 CLINICAL DATA:  Altered mental status, increasing weakness EXAM: CT HEAD WITHOUT CONTRAST TECHNIQUE: Contiguous axial images were obtained from the base of the skull through the vertex without intravenous contrast. RADIATION DOSE REDUCTION: This exam was performed according to the departmental dose-optimization program which includes automated exposure control, adjustment of the mA and/or kV according to patient size and/or use of iterative reconstruction technique. COMPARISON:  12/15/2021 FINDINGS: Brain: No evidence of acute infarction, hemorrhage, hydrocephalus, extra-axial collection or mass lesion/mass effect. Mild periventricular white matter hypodensity. Vascular: No hyperdense vessel or unexpected calcification. Skull: Normal. Negative for fracture or focal lesion. Sinuses/Orbits: No acute finding. 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 Other: None. IMPRESSION: 1. No acute intracranial pathology. Small-vessel white  matter disease. 2. 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 Electronically Signed   By: Delanna Ahmadi M.D.   On: 05/20/2022 10:49   DG Chest Portable 1 View  Result Date:  05/20/2022 CLINICAL DATA:  Shortness of breath. EXAM: PORTABLE CHEST 1 VIEW COMPARISON:  Chest x-ray dated February 10, 2022. FINDINGS: Unchanged tunneled right internal jugular dialysis catheter. Unchanged cardiomegaly. Mildly worsened pulmonary vascular congestion and diffuse interstitial thickening. No focal consolidation, pleural effusion, or pneumothorax. No acute osseous abnormality. IMPRESSION: 1. Mildly worsened interstitial pulmonary edema. Electronically Signed   By: Titus Dubin M.D.   On: 05/20/2022 10:40     Medications:    sodium chloride     anticoagulant sodium citrate      albuterol  2 puff Inhalation Q6H   [START ON 05/21/2022] amLODipine  10 mg Oral q morning   vitamin C  500 mg Oral Daily   calcium acetate  1,334 mg Oral TID WC   carbamazepine  1,000 mg Oral q morning   Chlorhexidine Gluconate Cloth  6 each Topical Q0600   cholecalciferol  1,000 Units Oral Daily   [START ON 05/27/2022] cloNIDine  0.2 mg Transdermal Weekly   dexamethasone (DECADRON) injection  6 mg Intravenous Q24H   doxazosin  4 mg Oral QHS   ferrous sulfate  325 mg Oral Daily   fluPHENAZine  2.5 mg Oral Daily   heparin  5,000 Units Subcutaneous Q8H   hydrALAZINE  100 mg Oral TID   insulin aspart  5 Units Intravenous Once   isosorbide mononitrate  60 mg Oral BID   minoxidil  2.5 mg Oral Daily   mometasone-formoterol  2 puff Inhalation BID   multivitamin  1 tablet Oral QHS   pantoprazole  40 mg Oral Daily   sodium chloride flush  3 mL Intravenous Q12H   sodium zirconium cyclosilicate  10 g Oral Once   trihexyphenidyl  2 mg Oral BID   zinc sulfate  220 mg Oral Daily   sodium chloride, acetaminophen, alteplase, anticoagulant sodium citrate, guaiFENesin-dextromethorphan, heparin, HYDROcodone-acetaminophen, lidocaine (PF), lidocaine-prilocaine, ondansetron **OR** ondansetron (ZOFRAN) IV, pentafluoroprop-tetrafluoroeth, polyethylene glycol, sodium chloride flush  Assessment/ Plan:  [name] is a 61 y.o.  male  with past medical conditions including COPD, hypertension, DVT, schizophrenia, and end-stage renal disease on hemodialysis.  Patient presents to the emergency department with complaints of hypoxia and weakness and has been admitted under observation for Hyperkalemia [E87.5] Weakness [R53.1] Acute on chronic hypoxic respiratory failure ([place]) [J96.21]   [facility]/MWF/Lt AVF   Hyperkalemia with end-stage renal disease on hemodialysis.  Last treatment received on Monday.  Potassium on admission 6.8.  Will perform urgent dialysis on 1K bath to manage elevated potassium levels.  UF goal 2 L as tolerated.  Next treatment scheduled for Friday.  Renal navigator aware of patient and recent COVID status, will determine any changes to outpatient needs.  2. Anemia of chronic kidney disease Lab Results  Component Value Date   HGB 8.6 (L) 05/20/2022    Hemoglobin below desired target.  Patient receives [place] outpatient.  Will monitor for now.  3. Secondary Hyperparathyroidism: Presumed Lab Results  Component Value Date   PTH 194 (H) 05/11/2017   CALCIUM 10.0 05/20/2022   CAION 1.19 03/27/2022   PHOS 3.7 05/20/2022    Bone minerals acceptable.  Will continue to monitor during this admission.  4.  Hypertension with chronic kidney disease.  Patient receives amlodipine, clonidine, hydralazine, irbesartan, isosorbide, doxazosin  and minoxidil outpatient.  Currently receiving these medications.   LOS: 0   12/20/20234:27 PM

## 2022-05-20 NOTE — ED Notes (Signed)
XR at bedside. Pt taken to CT afterwards

## 2022-05-21 DIAGNOSIS — R0902 Hypoxemia: Secondary | ICD-10-CM | POA: Diagnosis not present

## 2022-05-21 DIAGNOSIS — E8779 Other fluid overload: Secondary | ICD-10-CM | POA: Diagnosis present

## 2022-05-21 DIAGNOSIS — M1990 Unspecified osteoarthritis, unspecified site: Secondary | ICD-10-CM | POA: Diagnosis present

## 2022-05-21 DIAGNOSIS — Z9981 Dependence on supplemental oxygen: Secondary | ICD-10-CM | POA: Diagnosis not present

## 2022-05-21 DIAGNOSIS — I701 Atherosclerosis of renal artery: Secondary | ICD-10-CM | POA: Diagnosis present

## 2022-05-21 DIAGNOSIS — E875 Hyperkalemia: Secondary | ICD-10-CM | POA: Diagnosis present

## 2022-05-21 DIAGNOSIS — Z79899 Other long term (current) drug therapy: Secondary | ICD-10-CM | POA: Diagnosis not present

## 2022-05-21 DIAGNOSIS — Z992 Dependence on renal dialysis: Secondary | ICD-10-CM | POA: Diagnosis not present

## 2022-05-21 DIAGNOSIS — Z91158 Patient's noncompliance with renal dialysis for other reason: Secondary | ICD-10-CM | POA: Diagnosis not present

## 2022-05-21 DIAGNOSIS — N2581 Secondary hyperparathyroidism of renal origin: Secondary | ICD-10-CM | POA: Diagnosis present

## 2022-05-21 DIAGNOSIS — T465X5A Adverse effect of other antihypertensive drugs, initial encounter: Secondary | ICD-10-CM | POA: Diagnosis present

## 2022-05-21 DIAGNOSIS — F209 Schizophrenia, unspecified: Secondary | ICD-10-CM | POA: Diagnosis present

## 2022-05-21 DIAGNOSIS — Z86718 Personal history of other venous thrombosis and embolism: Secondary | ICD-10-CM | POA: Diagnosis not present

## 2022-05-21 DIAGNOSIS — J9621 Acute and chronic respiratory failure with hypoxia: Secondary | ICD-10-CM | POA: Diagnosis present

## 2022-05-21 DIAGNOSIS — Z8701 Personal history of pneumonia (recurrent): Secondary | ICD-10-CM | POA: Diagnosis not present

## 2022-05-21 DIAGNOSIS — J441 Chronic obstructive pulmonary disease with (acute) exacerbation: Secondary | ICD-10-CM | POA: Diagnosis present

## 2022-05-21 DIAGNOSIS — J811 Chronic pulmonary edema: Secondary | ICD-10-CM | POA: Diagnosis present

## 2022-05-21 DIAGNOSIS — R531 Weakness: Secondary | ICD-10-CM | POA: Diagnosis present

## 2022-05-21 DIAGNOSIS — E785 Hyperlipidemia, unspecified: Secondary | ICD-10-CM | POA: Diagnosis present

## 2022-05-21 DIAGNOSIS — D631 Anemia in chronic kidney disease: Secondary | ICD-10-CM | POA: Diagnosis present

## 2022-05-21 DIAGNOSIS — Z888 Allergy status to other drugs, medicaments and biological substances status: Secondary | ICD-10-CM | POA: Diagnosis not present

## 2022-05-21 DIAGNOSIS — U071 COVID-19: Secondary | ICD-10-CM | POA: Diagnosis present

## 2022-05-21 DIAGNOSIS — Z841 Family history of disorders of kidney and ureter: Secondary | ICD-10-CM | POA: Diagnosis not present

## 2022-05-21 DIAGNOSIS — I120 Hypertensive chronic kidney disease with stage 5 chronic kidney disease or end stage renal disease: Secondary | ICD-10-CM | POA: Diagnosis present

## 2022-05-21 DIAGNOSIS — N186 End stage renal disease: Secondary | ICD-10-CM | POA: Diagnosis present

## 2022-05-21 LAB — CBC
HCT: 23 % — ABNORMAL LOW (ref 39.0–52.0)
Hemoglobin: 7.5 g/dL — ABNORMAL LOW (ref 13.0–17.0)
MCH: 33.2 pg (ref 26.0–34.0)
MCHC: 32.6 g/dL (ref 30.0–36.0)
MCV: 101.8 fL — ABNORMAL HIGH (ref 80.0–100.0)
Platelets: 142 10*3/uL — ABNORMAL LOW (ref 150–400)
RBC: 2.26 MIL/uL — ABNORMAL LOW (ref 4.22–5.81)
RDW: 16.2 % — ABNORMAL HIGH (ref 11.5–15.5)
WBC: 4.9 10*3/uL (ref 4.0–10.5)
nRBC: 0 % (ref 0.0–0.2)

## 2022-05-21 LAB — BASIC METABOLIC PANEL
Anion gap: 11 (ref 5–15)
BUN: 37 mg/dL — ABNORMAL HIGH (ref 8–23)
CO2: 29 mmol/L (ref 22–32)
Calcium: 9 mg/dL (ref 8.9–10.3)
Chloride: 96 mmol/L — ABNORMAL LOW (ref 98–111)
Creatinine, Ser: 5.38 mg/dL — ABNORMAL HIGH (ref 0.61–1.24)
GFR, Estimated: 11 mL/min — ABNORMAL LOW (ref >60)
Glucose, Bld: 74 mg/dL (ref 70–99)
Potassium: 4.2 mmol/L (ref 3.5–5.1)
Sodium: 136 mmol/L (ref 135–145)

## 2022-05-21 LAB — HEPATITIS B SURFACE ANTIBODY, QUANTITATIVE: Hep B S AB Quant (Post): 66 m[IU]/mL (ref >9.9)

## 2022-05-21 MED ORDER — SODIUM CHLORIDE 0.9 % IV SOLN
100.0000 mg | Freq: Every day | INTRAVENOUS | Status: AC
  Administered 2022-05-22 – 2022-05-23 (×2): 100 mg via INTRAVENOUS
  Filled 2022-05-21: qty 20
  Filled 2022-05-21: qty 100

## 2022-05-21 MED ORDER — SODIUM CHLORIDE 0.9 % IV SOLN
200.0000 mg | Freq: Once | INTRAVENOUS | Status: AC
  Administered 2022-05-21: 200 mg via INTRAVENOUS
  Filled 2022-05-21: qty 40

## 2022-05-21 MED ORDER — DEXAMETHASONE 6 MG PO TABS
6.0000 mg | ORAL_TABLET | Freq: Every day | ORAL | Status: DC
  Administered 2022-05-22 – 2022-05-26 (×5): 6 mg via ORAL
  Filled 2022-05-21 (×5): qty 1

## 2022-05-21 NOTE — Progress Notes (Signed)
[facility]  ROUNDING NOTE   Subjective:   Todd Brown is a 61 year old male with past medical conditions including COPD, hypertension, DVT, schizophrenia, and end-stage renal disease on hemodialysis.  Patient presents to the emergency department with complaints of hypoxia and weakness and has been admitted under observation for Hyperkalemia [E87.5] Weakness [R53.1] Acute on chronic hypoxic respiratory failure ([facility]) [J96.21] Acute on chronic respiratory failure with hypoxia ([place]) [J96.21]  Patient is known to our practice and receives outpatient dialysis treatments at [facility] on a MWF schedule, supervised by Dr. Holley Raring.    Patient seen sitting up in bed Alert, speech more garbled Denies pain Requesting meal tray   Objective:  Vital signs in last 24 hours:  Temp:  [98.6 F (37 C)-98.9 F (37.2 C)] 98.9 F (37.2 C) (12/21 0555) Pulse Rate:  [72-83] 72 (12/20 2000) Resp:  [11-18] 11 (12/21 1000) BP: (139-186)/(72-88) 150/78 (12/21 1021) SpO2:  [94 %-96 %] 96 % (12/20 2000)  Weight change:  Filed Weights   05/20/22 1011  Weight: 81.6 kg    Intake/Output: I/O last 3 completed shifts: In: 120 [P.O.:120] Out: 2000 [Other:2000]   Intake/Output this shift:  No intake/output data recorded.  Physical Exam: General: NAD  Head: Normocephalic, atraumatic. Moist oral mucosal membranes  Eyes: Anicteric  Lungs:  Fine crackles, normal effort  Heart: Regular rate and rhythm  Abdomen:  Soft, nontender,   Extremities: No peripheral edema.  Neurologic: Alert and oriented to self and place  Skin: No lesions  Access: Right chest PermCath    Basic Metabolic Panel: Recent Labs  Lab 05/20/22 1007 05/21/22 0447  NA 137 136  K 6.8* 4.2  CL 96* 96*  CO2 28 29  GLUCOSE 79 74  BUN 53* 37*  CREATININE 7.48* 5.38*  CALCIUM 10.0 9.0  MG 2.2  --   PHOS 3.7  --      Liver Function Tests: Recent Labs  Lab 05/20/22 1007  AST 41  ALT 22  ALKPHOS  103  BILITOT 1.2  PROT 7.7  ALBUMIN 3.9    No results for input(s): "LIPASE", "AMYLASE" in the last 168 hours. No results for input(s): "AMMONIA" in the last 168 hours.  CBC: Recent Labs  Lab 05/20/22 1007 05/21/22 0447  WBC 7.7 4.9  HGB 8.6* 7.5*  HCT 26.8* 23.0*  MCV 103.1* 101.8*  PLT 173 142*     Cardiac Enzymes: No results for input(s): "CKTOTAL", "CKMB", "CKMBINDEX", "TROPONINI" in the last 168 hours.  BNP: Invalid input(s): "POCBNP"  CBG: Recent Labs  Lab 05/20/22 1133 05/20/22 1152 05/20/22 1857  GLUCAP 60* 105* 54     Microbiology: Results for orders placed or performed during the hospital encounter of 05/20/22  Resp panel by RT-PCR (RSV, Flu A&B, Covid) Anterior Nasal Swab     Status: Abnormal   Collection Time: 05/20/22 10:04 AM   Specimen: Anterior Nasal Swab  Result Value Ref Range Status   SARS Coronavirus 2 by RT PCR POSITIVE (A) NEGATIVE Final    Comment: (NOTE) SARS-CoV-2 target nucleic acids are DETECTED.  The SARS-CoV-2 RNA is generally detectable in upper respiratory specimens during the acute phase of infection. Positive results are indicative of the presence of the identified virus, but do not rule out bacterial infection or co-infection with other pathogens not detected by the test. Clinical correlation with patient history and other diagnostic information is necessary to determine patient infection status. The expected result is Negative.  Fact Sheet for Patients: EntrepreneurPulse.com.au  Fact Sheet for Healthcare Providers: IncredibleEmployment.be  This test is not yet approved or cleared by the [place] FDA and  has been authorized for detection and/or diagnosis of SARS-CoV-2 by FDA under an Emergency Use Authorization (EUA).  This EUA will remain in effect (meaning this test can be used) for the duration of  the COVID-19 declaration under Section 564(b)(1) of the A ct, 21 U.S.C.  section 360bbb-3(b)(1), unless the authorization is terminated or revoked sooner.     Influenza A by PCR NEGATIVE NEGATIVE Final   Influenza B by PCR NEGATIVE NEGATIVE Final    Comment: (NOTE) The Xpert Xpress SARS-CoV-2/FLU/RSV plus assay is intended as an aid in the diagnosis of influenza from Nasopharyngeal swab specimens and should not be used as a sole basis for treatment. Nasal washings and aspirates are unacceptable for Xpert Xpress SARS-CoV-2/FLU/RSV testing.  Fact Sheet for Patients: EntrepreneurPulse.com.au  Fact Sheet for Healthcare Providers: IncredibleEmployment.be  This test is not yet approved or cleared by the [place] FDA and has been authorized for detection and/or diagnosis of SARS-CoV-2 by FDA under an Emergency Use Authorization (EUA). This EUA will remain in effect (meaning this test can be used) for the duration of the COVID-19 declaration under Section 564(b)(1) of the Act, 21 U.S.C. section 360bbb-3(b)(1), unless the authorization is terminated or revoked.     Resp Syncytial Virus by PCR NEGATIVE NEGATIVE Final    Comment: (NOTE) Fact Sheet for Patients: EntrepreneurPulse.com.au  Fact Sheet for Healthcare Providers: IncredibleEmployment.be  This test is not yet approved or cleared by the [place] FDA and has been authorized for detection and/or diagnosis of SARS-CoV-2 by FDA under an Emergency Use Authorization (EUA). This EUA will remain in effect (meaning this test can be used) for the duration of the COVID-19 declaration under Section 564(b)(1) of the Act, 21 U.S.C. section 360bbb-3(b)(1), unless the authorization is terminated or revoked.  Performed at [facility], [address] [place], [place] [address]     Coagulation Studies: No results for input(s): "LABPROT", "INR" in the last 72 hours.  Urinalysis: No results for input(s): "COLORURINE",  "LABSPEC", "PHURINE", "GLUCOSEU", "HGBUR", "BILIRUBINUR", "KETONESUR", "PROTEINUR", "UROBILINOGEN", "NITRITE", "LEUKOCYTESUR" in the last 72 hours.  Invalid input(s): "APPERANCEUR"    Imaging: CT CHEST WO CONTRAST  Result Date: 05/20/2022 CLINICAL DATA:  COVID pneumonia short of breath EXAM: CT CHEST WITHOUT CONTRAST TECHNIQUE: Multidetector CT imaging of the chest was performed following the standard protocol without IV contrast. RADIATION DOSE REDUCTION: This exam was performed according to the departmental dose-optimization program which includes automated exposure control, adjustment of the mA and/or kV according to patient size and/or use of iterative reconstruction technique. COMPARISON:  Chest x-ray 05/20/2022 FINDINGS: Cardiovascular: Limited evaluation without intravenous contrast. Mild aortic atherosclerosis. No aneurysm. Dilated pulmonary trunk up to 4.3 cm. Coronary vascular calcification. Cardiomegaly. Small moderate circumferential pericardial effusion measuring up to 14 mm maximal thickness. Vascular catheter tip at the cavoatrial junction. Mediastinum/Nodes: Midline trachea. No suspicious thyroid nodule. Mild mediastinal adenopathy. Left paratracheal node measures 10 mm. Right paratracheal node measures 11 mm. Esophagus within normal limits. Lungs/Pleura: Small bilateral pleural effusions. Partial consolidation in left lower lobe, atelectasis versus pneumonia. 13 mm superior left lower lobe nodule or nodular consolidation, series 4, image 83. Somewhat irregular and nodular consolidative focus in the medial right lower lobe measuring 2.3 by 2 cm, series 4, image 78. Upper Abdomen: No acute finding. Musculoskeletal: Possible healing fracture mid sternum but degraded by motion. IMPRESSION: 1. Cardiomegaly with small to  moderate circumferential pericardial effusion. Small bilateral pleural effusions. Partial consolidation in the left lower lobe, atelectasis versus pneumonia. 2. 13 mm superior  left lower lobe nodule or nodular consolidation. Somewhat irregular and nodular consolidative focus in the medial right lower lobe measuring up to 2.3 cm. Findings could be infectious, inflammatory, or neoplastic in etiology. Short interval CT follow-up recommended to assess for resolution or persistence. 3. Mild mediastinal adenopathy, possibly reactive. 4. Dilated pulmonary trunk suggesting pulmonary arterial hypertension. 5. Aortic atherosclerosis. Aortic Atherosclerosis (ICD10-I70.0). Electronically Signed   By: Donavan Foil M.D.   On: 05/20/2022 17:29   CT Head Wo Contrast  Result Date: 05/20/2022 CLINICAL DATA:  Altered mental status, increasing weakness EXAM: CT HEAD WITHOUT CONTRAST TECHNIQUE: Contiguous axial images were obtained from the base of the skull through the vertex without intravenous contrast. RADIATION DOSE REDUCTION: This exam was performed according to the departmental dose-optimization program which includes automated exposure control, adjustment of the mA and/or kV according to patient size and/or use of iterative reconstruction technique. COMPARISON:  12/15/2021 FINDINGS: Brain: No evidence of acute infarction, hemorrhage, hydrocephalus, extra-axial collection or mass lesion/mass effect. Mild periventricular white matter hypodensity. Vascular: No hyperdense vessel or unexpected calcification. Skull: Normal. Negative for fracture or focal lesion. Sinuses/Orbits: No acute finding. 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 Other: None. IMPRESSION: 1. No acute intracranial pathology. Small-vessel white matter disease. 2. 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 Electronically Signed   By: Delanna Ahmadi M.D.   On: 05/20/2022 10:49   DG Chest Portable 1 View  Result Date: 05/20/2022 CLINICAL DATA:  Shortness of breath. EXAM: PORTABLE CHEST 1 VIEW COMPARISON:  Chest x-ray dated February 10, 2022. FINDINGS:  Unchanged tunneled right internal jugular dialysis catheter. Unchanged cardiomegaly. Mildly worsened pulmonary vascular congestion and diffuse interstitial thickening. No focal consolidation, pleural effusion, or pneumothorax. No acute osseous abnormality. IMPRESSION: 1. Mildly worsened interstitial pulmonary edema. Electronically Signed   By: Titus Dubin M.D.   On: 05/20/2022 10:40     Medications:    sodium chloride     remdesivir 200 mg in sodium chloride 0.9% 250 mL IVPB     Followed by   Derrill Memo ON 05/22/2022] remdesivir 100 mg in sodium chloride 0.9 % 100 mL IVPB      albuterol  2 puff Inhalation Q6H   amLODipine  10 mg Oral q morning   vitamin C  500 mg Oral Daily   calcium acetate  1,334 mg Oral TID WC   carbamazepine  1,000 mg Oral q morning   Chlorhexidine Gluconate Cloth  6 each Topical Q0600   cholecalciferol  1,000 Units Oral Daily   [START ON 05/27/2022] cloNIDine  0.2 mg Transdermal Weekly   [START ON 05/22/2022] dexamethasone  6 mg Oral Daily   doxazosin  4 mg Oral QHS   ferrous sulfate  325 mg Oral Daily   fluPHENAZine  2.5 mg Oral Daily   heparin  5,000 Units Subcutaneous Q8H   hydrALAZINE  100 mg Oral TID   insulin aspart  5 Units Intravenous Once   isosorbide mononitrate  60 mg Oral BID   minoxidil  2.5 mg Oral Daily   mometasone-formoterol  2 puff Inhalation BID   multivitamin  1 tablet Oral QHS   pantoprazole  40 mg Oral Daily   trihexyphenidyl  2 mg Oral BID   zinc sulfate  220 mg Oral Daily   sodium chloride, acetaminophen, guaiFENesin-dextromethorphan, ondansetron **OR** ondansetron (  ZOFRAN) IV, polyethylene glycol  Assessment/ Plan:  Mr. CHUONG CASEBEER is a 61 y.o.  male  with past medical conditions including COPD, hypertension, DVT, schizophrenia, and end-stage renal disease on hemodialysis.  Patient presents to the emergency department with complaints of hypoxia and weakness and has been admitted under observation for Hyperkalemia  [E87.5] Weakness [R53.1] Acute on chronic hypoxic respiratory failure ([facility]) [J96.21] Acute on chronic respiratory failure with hypoxia ([place]) [J96.21]   [facility]/MWF/Lt AVF   Hyperkalemia with end-stage renal disease on hemodialysis.  Last treatment received on Monday.  Potassium on admission 6.8.  Potassium corrected to 4.2 after dialysis.  Next treatment scheduled for Friday.  2. Anemia of chronic kidney disease Lab Results  Component Value Date   HGB 7.5 (L) 05/21/2022    Hemoglobin not at goal.  Patient receives Mir[place] at outpatient clinic.  No need for ESA's at this time.  Will continue to monitor.  3. Secondary Hyperparathyroidism: Presumed Lab Results  Component Value Date   PTH 194 (H) 05/11/2017   CALCIUM 9.0 05/21/2022   CAION 1.19 03/27/2022   PHOS 3.7 05/20/2022    Will continue to monitor during this admission.  4.  Hypertension with chronic kidney disease.  Patient receives amlodipine, clonidine, hydralazine, irbesartan, isosorbide, doxazosin and minoxidil outpatient.  Currently receiving these medications. Blood pressure acceptable for this patient.    LOS: 0   12/21/20235:43 PM

## 2022-05-21 NOTE — ED Notes (Signed)
Report given to Rachael RN.

## 2022-05-21 NOTE — TOC Initial Note (Signed)
Transition of Care [facility]) - Initial/Assessment Note    Patient Details  Name: Todd Brown MRN: [id number] Date of Birth: 08-12-1960  Transition of Care [facility]) CM/SW Contact:    Shelbie Hutching, RN Phone Number: 05/21/2022, 12:48 PM  Clinical Narrative:                 Patient is under observation for COVID 19.  Patient has a legal guardian with [place] DSS.  Patient is from [place], he is ESRD on dialysis.  Glennis Brink called with [facility] - she was contacted by the patient's primary doctor that patient may benefit from hospice services.    TOC will cont to follow.   Planned Disposition: ALF Barriers to Discharge: Continued Medical Work up   Patient Goals and CMS Choice Patient states their goals for this hospitalization and ongoing recovery are:: patient unable to state          Expected Discharge Plan and Services Planned Disposition: ALF   Discharge Planning Services: CM Consult   Living arrangements for the past 2 months: Assisted Living Facility                                      Prior Living Arrangements/Services Living arrangements for the past 2 months: [place] Lives with:: Facility Resident Patient language and need for interpreter reviewed:: Yes        Need for Family Participation in Patient Care: Yes (Comment) Care giver support system in place?: Yes (comment) Current home services: DME (oxygen) Criminal Activity/Legal Involvement Pertinent to Current Situation/Hospitalization: No - Comment as needed  Activities of Daily Living Home Assistive Devices/Equipment: Walker (specify type) ADL Screening (condition at time of admission) Patient's cognitive ability adequate to safely complete daily activities?: No Is the patient deaf or have difficulty hearing?: No Does the patient have difficulty seeing, even when wearing glasses/contacts?: No Does the patient have difficulty concentrating, remembering, or  making decisions?: Yes Patient able to express need for assistance with ADLs?: Yes Does the patient have difficulty dressing or bathing?: No Independently performs ADLs?: No Communication: Independent Dressing (OT): Needs assistance Is this a change from baseline?: Pre-admission baseline Grooming: Needs assistance Is this a change from baseline?: Pre-admission baseline Feeding: Independent Bathing: Needs assistance Is this a change from baseline?: Pre-admission baseline Toileting: Needs assistance Is this a change from baseline?: Pre-admission baseline In/Out Bed: Needs assistance Is this a change from baseline?: Pre-admission baseline Walks in Home: Needs assistance Is this a change from baseline?: Pre-admission baseline Does the patient have difficulty walking or climbing stairs?: Yes Weakness of Legs: Both Weakness of Arms/Hands: None  Permission Sought/Granted      Share Information with NAME: Ivan Anchors  Permission granted to share info w AGENCY: [name]  Permission granted to share info w Relationship: guardian  Permission granted to share info w Contact Information: [phone]  Emotional Assessment       Orientation: : Oriented to Self Alcohol / Substance Use: Not Applicable Psych Involvement: Outpatient Provider  Admission diagnosis:  Hyperkalemia [E87.5] Weakness [R53.1] Acute on chronic hypoxic respiratory failure ([place]) [J96.21] Patient Active Problem List   Diagnosis Date Noted   Hypoxia 05/20/2022   COVID-19 virus infection 05/20/2022   Hyponatremia 02/10/2022   Anemia due to chronic kidney disease 01/21/2022   Hyperkalemia 01/14/2022   Malnutrition of moderate degree 12/24/2021   Oral thrush  12/22/2021   Physical deconditioning 12/20/2021   Mental health disorder 12/19/2021   Essential hypertension 12/15/2021   ESRD on dialysis ([place]) 12/15/2021   Altered mental status 10/08/2021   Hypertensive urgency 10/07/2021   COPD  (chronic obstructive pulmonary disease) ([place]) 02/26/2021   GERD (gastroesophageal reflux disease) 02/26/2021   Hypertension 11/28/2020   Acute respiratory failure with hypoxia ([place]) 10/23/2019   Anemia of chronic disease 10/23/2019   Fluid overload 10/08/2018   Acute pulmonary edema ([place]) 09/12/2018   Scrotal edema 05/10/2017   Symptomatic anemia 04/30/2017   Anticoagulated on Coumadin 09/14/2016   Cardiac arrest ([place]) 08/03/2016   Sepsis ([place]) 05/26/2016   Dialysis patient ([place]) 04/15/2016   End stage renal disease ([place]) 02/12/2016   Hyperkalemia 05/20/2015   Hepatitis C 09/26/2013   Schizophrenia ([place]) 03/23/2013   Essential hypertension, benign 03/23/2013   Chronic kidney disease 03/23/2013   PCP:  [name], NP Pharmacy:   [place], [place] - [address] [place] [address] Phone: [phone] Fax: [phone]     Social Determinants of Health (SDOH) Social History: SDOH Screenings   Food Insecurity: No Food Insecurity (10/08/2018)  Transportation Needs: No Transportation Needs (10/08/2018)  Alcohol Screen: Low Risk  (10/05/2019)  Financial Resource Strain: Low Risk  (10/08/2018)  Physical Activity: Unknown (10/08/2018)  Social Connections: Unknown (10/08/2018)  Stress: No Stress Concern Present (10/08/2018)  Tobacco Use: Low Risk  (05/20/2022)   SDOH Interventions:     Readmission Risk Interventions     No data to display

## 2022-05-21 NOTE — Discharge Planning (Signed)
ESTABLISHED HEMODIALYSIS: [facility]  [address]  [place], [place] [address] [phone]  Scheduled Days: Tuesday Thursday and Saturday   Treatment Time: 11:00am  Patient is COVID +, confirmed with clinic that will be no change to patient schedule.

## 2022-05-21 NOTE — Progress Notes (Signed)
  PROGRESS NOTE    Todd Brown  ZOX:[id number] DOB: 08/20/1960 DOA: 05/20/2022 PCP: [name], NP  [id number]  LOS: 0 days   Brief hospital course:   Assessment & Plan: Todd Brown is a 61 y.o. 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  Patient was said to be very weak and unable to get out of bed and so could not go to the dialysis center.   When EMS arrived patient was hypoxic to 74% on his 2 L nasal cannula they placed him on a nonrebreather.   Acute on chronic hypoxemic respiratory failure COPD on 2L at baseline Hypoxia appears to be multifactorial and related to volume overload from end-stage renal disease as well as from COVID-19 viral infection. --treat volume overload with dialysis --treat covid infection  COVID-19 virus infection --started on IV decadron Plan: --switch to oral decadron --start Remdesivir for 3 day course  Pulm edema --appears to have chronic pulm edema, CXR on presentation showed pulm edema mildly worsened.   --received dialysis shortly after presentation. --will increase UF at outpatient dialysis, per nephro  Hyperkalemia, resolved Secondary to end-stage renal disease in the setting of ARB use D/c'ed irbesartan Twelve-lead EKG showed no acute findings Patient received calcium gluconate and Lokelma as well as emergent renal replacement therapy  ESRD on dialysis ([place]) MWF --iHD per nephro  Anemia due to chronic kidney disease Patient has anemia due to chronic kidney disease but is noted to have a significant drop in his H&H over the last 1 month 10.9 >> 8.6 --anemia workup  Schizophrenia (HCC) Continue Artane and fluphenazine  HTN --cont amlodipine, clonidine patch, hydralazine, Imdur, doxazosin and  minoxidil   DVT prophylaxis: Heparin SQ Code Status: Full code  Family Communication:  Level of care: Med-Surg Dispo:   The patient is from: group home Anticipated d/c is to: group home Anticipated d/c date is: 1-2 days   Subjective and Interval History:  Difficult to understand pt's speech as he rambled on, and pt would not give simple yes/no answers to ROS questions.     Objective: Vitals:   05/21/22 0600 05/21/22 0800 05/21/22 1000 05/21/22 1021  BP: 139/82 (!) 142/84 (!) 150/78 (!) 150/78  Pulse:      Resp: 16 15 11    Temp:      TempSrc:      SpO2:      Weight:      Height:       No intake or output data in the 24 hours ending 05/21/22 1702 Filed Weights   05/20/22 1011  Weight: 81.6 kg    Examination:   Constitutional: NAD, alert HEENT: conjunctivae and lids normal, EOMI CV: No cyanosis.   RESP: normal respiratory effort, on 2L   Data Reviewed: I have personally reviewed labs and imaging studies  Time spent: 50 minutes  Todd Bi, MD [facility] If 7PM-7AM, please contact night-coverage 05/21/2022, 5:02 PM

## 2022-05-22 DIAGNOSIS — J9621 Acute and chronic respiratory failure with hypoxia: Secondary | ICD-10-CM | POA: Diagnosis not present

## 2022-05-22 LAB — HEPATITIS B CORE ANTIBODY, IGM: Hep B C IgM: REACTIVE — AB

## 2022-05-22 LAB — BASIC METABOLIC PANEL
Anion gap: 14 (ref 5–15)
BUN: 55 mg/dL — ABNORMAL HIGH (ref 8–23)
CO2: 25 mmol/L (ref 22–32)
Calcium: 8.6 mg/dL — ABNORMAL LOW (ref 8.9–10.3)
Chloride: 95 mmol/L — ABNORMAL LOW (ref 98–111)
Creatinine, Ser: 7.12 mg/dL — ABNORMAL HIGH (ref 0.61–1.24)
GFR, Estimated: 8 mL/min — ABNORMAL LOW (ref >60)
Glucose, Bld: 129 mg/dL — ABNORMAL HIGH (ref 70–99)
Potassium: 4.4 mmol/L (ref 3.5–5.1)
Sodium: 134 mmol/L — ABNORMAL LOW (ref 135–145)

## 2022-05-22 LAB — CBC
HCT: 22.8 % — ABNORMAL LOW (ref 39.0–52.0)
Hemoglobin: 7.3 g/dL — ABNORMAL LOW (ref 13.0–17.0)
MCH: 33 pg (ref 26.0–34.0)
MCHC: 32 g/dL (ref 30.0–36.0)
MCV: 103.2 fL — ABNORMAL HIGH (ref 80.0–100.0)
Platelets: 132 10*3/uL — ABNORMAL LOW (ref 150–400)
RBC: 2.21 MIL/uL — ABNORMAL LOW (ref 4.22–5.81)
RDW: 16.3 % — ABNORMAL HIGH (ref 11.5–15.5)
WBC: 4.4 10*3/uL (ref 4.0–10.5)
nRBC: 0 % (ref 0.0–0.2)

## 2022-05-22 LAB — FOLATE: Folate: 35 ng/mL (ref >5.9)

## 2022-05-22 LAB — MAGNESIUM: Magnesium: 1.8 mg/dL (ref 1.7–2.4)

## 2022-05-22 MED ORDER — HEPARIN SODIUM (PORCINE) 1000 UNIT/ML IJ SOLN
INTRAMUSCULAR | Status: AC
  Administered 2022-05-22: 10000 [IU]
  Filled 2022-05-22: qty 10

## 2022-05-22 MED ORDER — SODIUM CHLORIDE 0.9 % IV SOLN
200.0000 mg | Freq: Every day | INTRAVENOUS | Status: AC
  Administered 2022-05-22 – 2022-05-24 (×3): 200 mg via INTRAVENOUS
  Filled 2022-05-22 (×3): qty 200

## 2022-05-22 NOTE — Progress Notes (Signed)
Pt difficulty eating salad and vegetable w/ dinner. Caused a dry, non productive cough. Notified primary RN.

## 2022-05-22 NOTE — Progress Notes (Signed)
  PROGRESS NOTE    Todd Brown  [name]:[id number] DOB: 30-Dec-1960 DOA: 05/20/2022 PCP: [name], NP  [id number]  LOS: 1 day   Brief hospital course:   Assessment & Plan: Todd Brown is a 61 y.o. 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  Patient was said to be very weak and unable to get out of bed and so could not go to the dialysis center.   When EMS arrived patient was hypoxic to 74% on his 2 L nasal cannula they placed him on a nonrebreather.   Acute on chronic hypoxemic respiratory failure COPD on 2L at baseline Hypoxia appears to be multifactorial and related to volume overload from end-stage renal disease as well as from COVID-19 viral infection. --treat volume overload with dialysis --treat covid infection  COVID-19 virus infection --started on IV decadron, switch to oral --started on Remdesivir Plan: --cont oral decadron --cont Remdesivir, day 2 of 3  Pulm edema --appears to have chronic pulm edema, CXR on presentation showed pulm edema mildly worsened.   --received dialysis shortly after presentation. --cont dialysis with increased UF  Hyperkalemia, resolved Secondary to end-stage renal disease in the setting of ARB use D/c'ed irbesartan Twelve-lead EKG showed no acute findings Patient received calcium gluconate and Lokelma as well as emergent renal replacement therapy  ESRD on dialysis ([place]) MWF --iHD per nephro  Anemia due to chronic kidney disease Patient has anemia due to chronic kidney disease but is noted to have a significant drop in his H&H over the last 1 month 10.9 >> 8.6 --anemia workup showed iron def --start IV iron  Schizophrenia (HCC) Continue Artane and fluphenazine  HTN --on amlodipine, clonidine patch,  hydralazine, Imdur, doxazosin and minoxidil --cont current regimen   DVT prophylaxis: Heparin SQ Code Status: Full code  Family Communication:  Level of care: Med-Surg Dispo:   The patient is from: group home Anticipated d/c is to: group home Anticipated d/c date is: 1-2 days   Subjective and Interval History:  Still on 4L O2, with intermittent desat.  Good oral intake.   Objective: Vitals:   05/22/22 0700 05/22/22 1000 05/22/22 1151 05/22/22 1350  BP: 110/69 115/73 112/75 124/77  Pulse: 62  74 72  Resp: 15 12 14 16   Temp: 98.1 F (36.7 C)  98.8 F (37.1 C) 99.5 F (37.5 C)  TempSrc: Oral  Oral   SpO2: 96%  90% 99%  Weight:      Height:       No intake or output data in the 24 hours ending 05/22/22 1533 Filed Weights   05/20/22 1011  Weight: 81.6 kg    Examination:   Constitutional: NAD, alert, speech incomprehensible  HEENT: conjunctivae and lids normal, EOMI CV: No cyanosis.   RESP: normal respiratory effort, on 4L   Data Reviewed: I have personally reviewed labs and imaging studies  Time spent: 35 minutes  Enzo Bi, MD [facility] If 7PM-7AM, please contact night-coverage 05/22/2022, 3:33 PM

## 2022-05-22 NOTE — Progress Notes (Signed)
Pre hd rn assessment 

## 2022-05-22 NOTE — Progress Notes (Signed)
Post hd rn assessment 

## 2022-05-22 NOTE — Progress Notes (Signed)
Pt HD started w/ no complications. BFR 350 d/t machine alarms. Pt alert, listening to tv and eating. RN at bedside, continue to monitor.

## 2022-05-22 NOTE — Progress Notes (Signed)
Pt 3 hour HD complete, pt alert, no c/o, vss, report to primary RN at bedside.  Start: 1618 End: 1919 1554ml fluid removed 63L BVP 80.6kg post hd bed weight Venofer 145ml iv with HD

## 2022-05-22 NOTE — Progress Notes (Signed)
[facility]  ROUNDING NOTE   Subjective:   Todd Brown is a 61 year old male with past medical conditions including COPD, hypertension, DVT, schizophrenia, and end-stage renal disease on hemodialysis.  Patient presents to the emergency department with complaints of hypoxia and weakness and has been admitted under observation for Hyperkalemia [E87.5] Weakness [R53.1] Acute on chronic respiratory failure with hypoxia (HCC) [J96.21] Acute on chronic hypoxic respiratory failure ([place]) [J96.21]  Patient is known to our practice and receives outpatient dialysis treatments at [facility] on a MWF schedule, supervised by Dr. Holley Raring.    Patient seen resting in bed, alert and oriented Remains on 4 L nasal cannula Scheduled to receive dialysis later today No lower extremity edema   Objective:  Vital signs in last 24 hours:  Temp:  [97.9 F (36.6 C)-99.5 F (37.5 C)] 99.5 F (37.5 C) (12/22 1350) Pulse Rate:  [62-74] 72 (12/22 1350) Resp:  [10-16] 16 (12/22 1350) BP: (110-163)/(63-89) 124/77 (12/22 1350) SpO2:  [90 %-99 %] 99 % (12/22 1350)  Weight change:  Filed Weights   05/20/22 1011  Weight: 81.6 kg    Intake/Output: No intake/output data recorded.   Intake/Output this shift:  No intake/output data recorded.  Physical Exam: General: NAD  Head: Normocephalic, atraumatic. Moist oral mucosal membranes  Eyes: Anicteric  Lungs:  Fine crackles, normal effort  Heart: Regular rate and rhythm  Abdomen:  Soft, nontender,   Extremities: No peripheral edema.  Neurologic: Alert and oriented to self and place  Skin: No lesions  Access: Right chest PermCath    Basic Metabolic Panel: Recent Labs  Lab 05/20/22 1007 05/21/22 0447 05/22/22 0606  NA 137 136 134*  K 6.8* 4.2 4.4  CL 96* 96* 95*  CO2 28 29 25   GLUCOSE 79 74 129*  BUN 53* 37* 55*  CREATININE 7.48* 5.38* 7.12*  CALCIUM 10.0 9.0 8.6*  MG 2.2  --  1.8  PHOS 3.7  --   --      Liver  Function Tests: Recent Labs  Lab 05/20/22 1007  AST 41  ALT 22  ALKPHOS 103  BILITOT 1.2  PROT 7.7  ALBUMIN 3.9    No results for input(s): "LIPASE", "AMYLASE" in the last 168 hours. No results for input(s): "AMMONIA" in the last 168 hours.  CBC: Recent Labs  Lab 05/20/22 1007 05/21/22 0447 05/22/22 0606  WBC 7.7 4.9 4.4  HGB 8.6* 7.5* 7.3*  HCT 26.8* 23.0* 22.8*  MCV 103.1* 101.8* 103.2*  PLT 173 142* 132*     Cardiac Enzymes: No results for input(s): "CKTOTAL", "CKMB", "CKMBINDEX", "TROPONINI" in the last 168 hours.  BNP: Invalid input(s): "POCBNP"  CBG: Recent Labs  Lab 05/20/22 1133 05/20/22 1152 05/20/22 1857  GLUCAP 60* 105* 64     Microbiology: Results for orders placed or performed during the hospital encounter of 05/20/22  Resp panel by RT-PCR (RSV, Flu A&B, Covid) Anterior Nasal Swab     Status: Abnormal   Collection Time: 05/20/22 10:04 AM   Specimen: Anterior Nasal Swab  Result Value Ref Range Status   SARS Coronavirus 2 by RT PCR POSITIVE (A) NEGATIVE Final    Comment: (NOTE) SARS-CoV-2 target nucleic acids are DETECTED.  The SARS-CoV-2 RNA is generally detectable in upper respiratory specimens during the acute phase of infection. Positive results are indicative of the presence of the identified virus, but do not rule out bacterial infection or co-infection with other pathogens not detected by the test. Clinical correlation with patient history  and other diagnostic information is necessary to determine patient infection status. The expected result is Negative.  Fact Sheet for Patients: EntrepreneurPulse.com.au  Fact Sheet for Healthcare Providers: IncredibleEmployment.be  This test is not yet approved or cleared by the [place] FDA and  has been authorized for detection and/or diagnosis of SARS-CoV-2 by FDA under an Emergency Use Authorization (EUA).  This EUA will remain in effect (meaning  this test can be used) for the duration of  the COVID-19 declaration under Section 564(b)(1) of the A ct, 21 U.S.C. section 360bbb-3(b)(1), unless the authorization is terminated or revoked sooner.     Influenza A by PCR NEGATIVE NEGATIVE Final   Influenza B by PCR NEGATIVE NEGATIVE Final    Comment: (NOTE) The Xpert Xpress SARS-CoV-2/FLU/RSV plus assay is intended as an aid in the diagnosis of influenza from Nasopharyngeal swab specimens and should not be used as a sole basis for treatment. Nasal washings and aspirates are unacceptable for Xpert Xpress SARS-CoV-2/FLU/RSV testing.  Fact Sheet for Patients: EntrepreneurPulse.com.au  Fact Sheet for Healthcare Providers: IncredibleEmployment.be  This test is not yet approved or cleared by the [place] FDA and has been authorized for detection and/or diagnosis of SARS-CoV-2 by FDA under an Emergency Use Authorization (EUA). This EUA will remain in effect (meaning this test can be used) for the duration of the COVID-19 declaration under Section 564(b)(1) of the Act, 21 U.S.C. section 360bbb-3(b)(1), unless the authorization is terminated or revoked.     Resp Syncytial Virus by PCR NEGATIVE NEGATIVE Final    Comment: (NOTE) Fact Sheet for Patients: EntrepreneurPulse.com.au  Fact Sheet for Healthcare Providers: IncredibleEmployment.be  This test is not yet approved or cleared by the [place] FDA and has been authorized for detection and/or diagnosis of SARS-CoV-2 by FDA under an Emergency Use Authorization (EUA). This EUA will remain in effect (meaning this test can be used) for the duration of the COVID-19 declaration under Section 564(b)(1) of the Act, 21 U.S.C. section 360bbb-3(b)(1), unless the authorization is terminated or revoked.  Performed at [facility], [address] [place], [place] [address]     Coagulation  Studies: No results for input(s): "LABPROT", "INR" in the last 72 hours.  Urinalysis: No results for input(s): "COLORURINE", "LABSPEC", "PHURINE", "GLUCOSEU", "HGBUR", "BILIRUBINUR", "KETONESUR", "PROTEINUR", "UROBILINOGEN", "NITRITE", "LEUKOCYTESUR" in the last 72 hours.  Invalid input(s): "APPERANCEUR"    Imaging: CT CHEST WO CONTRAST  Result Date: 05/20/2022 CLINICAL DATA:  COVID pneumonia short of breath EXAM: CT CHEST WITHOUT CONTRAST TECHNIQUE: Multidetector CT imaging of the chest was performed following the standard protocol without IV contrast. RADIATION DOSE REDUCTION: This exam was performed according to the departmental dose-optimization program which includes automated exposure control, adjustment of the mA and/or kV according to patient size and/or use of iterative reconstruction technique. COMPARISON:  Chest x-ray 05/20/2022 FINDINGS: Cardiovascular: Limited evaluation without intravenous contrast. Mild aortic atherosclerosis. No aneurysm. Dilated pulmonary trunk up to 4.3 cm. Coronary vascular calcification. Cardiomegaly. Small moderate circumferential pericardial effusion measuring up to 14 mm maximal thickness. Vascular catheter tip at the cavoatrial junction. Mediastinum/Nodes: Midline trachea. No suspicious thyroid nodule. Mild mediastinal adenopathy. Left paratracheal node measures 10 mm. Right paratracheal node measures 11 mm. Esophagus within normal limits. Lungs/Pleura: Small bilateral pleural effusions. Partial consolidation in left lower lobe, atelectasis versus pneumonia. 13 mm superior left lower lobe nodule or nodular consolidation, series 4, image 83. Somewhat irregular and nodular consolidative focus in the medial right lower lobe measuring 2.3 by 2 cm, series 4,  image 78. Upper Abdomen: No acute finding. Musculoskeletal: Possible healing fracture mid sternum but degraded by motion. IMPRESSION: 1. Cardiomegaly with small to moderate circumferential pericardial effusion.  Small bilateral pleural effusions. Partial consolidation in the left lower lobe, atelectasis versus pneumonia. 2. 13 mm superior left lower lobe nodule or nodular consolidation. Somewhat irregular and nodular consolidative focus in the medial right lower lobe measuring up to 2.3 cm. Findings could be infectious, inflammatory, or neoplastic in etiology. Short interval CT follow-up recommended to assess for resolution or persistence. 3. Mild mediastinal adenopathy, possibly reactive. 4. Dilated pulmonary trunk suggesting pulmonary arterial hypertension. 5. Aortic atherosclerosis. Aortic Atherosclerosis (ICD10-I70.0). Electronically Signed   By: Donavan Foil M.D.   On: 05/20/2022 17:29     Medications:    sodium chloride     iron sucrose     remdesivir 100 mg in sodium chloride 0.9 % 100 mL IVPB Stopped (05/22/22 1146)    albuterol  2 puff Inhalation Q6H   amLODipine  10 mg Oral q morning   vitamin C  500 mg Oral Daily   calcium acetate  1,334 mg Oral TID WC   carbamazepine  1,000 mg Oral q morning   Chlorhexidine Gluconate Cloth  6 each Topical Q0600   cholecalciferol  1,000 Units Oral Daily   [START ON 05/27/2022] cloNIDine  0.2 mg Transdermal Weekly   dexamethasone  6 mg Oral Daily   doxazosin  4 mg Oral QHS   ferrous sulfate  325 mg Oral Daily   fluPHENAZine  2.5 mg Oral Daily   heparin  5,000 Units Subcutaneous Q8H   heparin sodium (porcine)       hydrALAZINE  100 mg Oral TID   insulin aspart  5 Units Intravenous Once   isosorbide mononitrate  60 mg Oral BID   minoxidil  2.5 mg Oral Daily   mometasone-formoterol  2 puff Inhalation BID   multivitamin  1 tablet Oral QHS   pantoprazole  40 mg Oral Daily   trihexyphenidyl  2 mg Oral BID   zinc sulfate  220 mg Oral Daily   sodium chloride, acetaminophen, guaiFENesin-dextromethorphan, heparin sodium (porcine), ondansetron **OR** ondansetron (ZOFRAN) IV, polyethylene glycol  Assessment/ Plan:  Todd Brown is a 61 y.o.  male   with past medical conditions including COPD, hypertension, DVT, schizophrenia, and end-stage renal disease on hemodialysis.  Patient presents to the emergency department with complaints of hypoxia and weakness and has been admitted under observation for Hyperkalemia [E87.5] Weakness [R53.1] Acute on chronic respiratory failure with hypoxia (HCC) [J96.21] Acute on chronic hypoxic respiratory failure ([place]) [J96.21]   [facility]/MWF/Lt AVF   Hyperkalemia with end-stage renal disease on hemodialysis.  Potassium corrected with dialysis.  Next treatment scheduled for later today.  Due to holiday next treatment will be scheduled for Sunday.  2. Anemia of chronic kidney disease Lab Results  Component Value Date   HGB 7.3 (L) 05/22/2022    Hemoglobin below desired target.  Patient receives Mircera at outpatient clinic.  Will continue to monitor.  3. Secondary Hyperparathyroidism: Presumed Lab Results  Component Value Date   PTH 194 (H) 05/11/2017   CALCIUM 8.6 (L) 05/22/2022   CAION 1.19 03/27/2022   PHOS 3.7 05/20/2022    Bone minerals acceptable at this time.  4.  Hypertension with chronic kidney disease.  Patient receives amlodipine, clonidine, hydralazine, irbesartan, isosorbide, doxazosin and minoxidil outpatient.  Currently receiving these medications. Blood pressure acceptable, 124/77.   LOS: [place] 12/22/20233:49  PM

## 2022-05-23 DIAGNOSIS — J9621 Acute and chronic respiratory failure with hypoxia: Secondary | ICD-10-CM | POA: Diagnosis not present

## 2022-05-23 LAB — CBC
HCT: 21.3 % — ABNORMAL LOW (ref 39.0–52.0)
Hemoglobin: 7.1 g/dL — ABNORMAL LOW (ref 13.0–17.0)
MCH: 33.2 pg (ref 26.0–34.0)
MCHC: 33.3 g/dL (ref 30.0–36.0)
MCV: 99.5 fL (ref 80.0–100.0)
Platelets: 153 10*3/uL (ref 150–400)
RBC: 2.14 MIL/uL — ABNORMAL LOW (ref 4.22–5.81)
RDW: 16.1 % — ABNORMAL HIGH (ref 11.5–15.5)
WBC: 3.6 10*3/uL — ABNORMAL LOW (ref 4.0–10.5)
nRBC: 0 % (ref 0.0–0.2)

## 2022-05-23 LAB — BASIC METABOLIC PANEL
Anion gap: 9 (ref 5–15)
BUN: 40 mg/dL — ABNORMAL HIGH (ref 8–23)
CO2: 30 mmol/L (ref 22–32)
Calcium: 8.5 mg/dL — ABNORMAL LOW (ref 8.9–10.3)
Chloride: 96 mmol/L — ABNORMAL LOW (ref 98–111)
Creatinine, Ser: 5.43 mg/dL — ABNORMAL HIGH (ref 0.61–1.24)
GFR, Estimated: 11 mL/min — ABNORMAL LOW (ref >60)
Glucose, Bld: 77 mg/dL (ref 70–99)
Potassium: 4.1 mmol/L (ref 3.5–5.1)
Sodium: 135 mmol/L (ref 135–145)

## 2022-05-23 LAB — C-REACTIVE PROTEIN: CRP: 3.4 mg/dL — ABNORMAL HIGH (ref <1.0)

## 2022-05-23 LAB — MAGNESIUM: Magnesium: 1.8 mg/dL (ref 1.7–2.4)

## 2022-05-23 LAB — VITAMIN B12: Vitamin B-12: 1679 pg/mL — ABNORMAL HIGH (ref 180–914)

## 2022-05-23 MED ORDER — EPOETIN ALFA 4000 UNIT/ML IJ SOLN
4000.0000 [IU] | INTRAMUSCULAR | Status: DC
  Administered 2022-05-24 – 2022-05-27 (×2): 4000 [IU] via INTRAVENOUS
  Filled 2022-05-23 (×2): qty 1

## 2022-05-23 NOTE — Progress Notes (Signed)
  PROGRESS NOTE    Todd Brown  NWG:[id number] DOB: 1960-06-05 DOA: 05/20/2022 PCP: [name], NP  [id number]/[id number]-AA  LOS: 2 days   Brief hospital course:   Assessment & Plan: Todd Brown is a 61 y.o. 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  Patient was said to be very weak and unable to get out of bed and so could not go to the dialysis center.   When EMS arrived patient was hypoxic to 74% on his 2 L nasal cannula they placed him on a nonrebreather.   Acute on chronic hypoxemic respiratory failure COPD on 2L at baseline Hypoxia appears to be multifactorial and related to volume overload from end-stage renal disease as well as from COVID-19 viral infection. --treat volume overload with dialysis --treat covid infection  COVID-19 virus infection --started on IV decadron, switch to oral --started on Remdesivir Plan: --cont oral decadron --cont Remdesivir, day 3 of 3  Pulm edema --appears to have chronic pulm edema, CXR on presentation showed pulm edema mildly worsened.   --received dialysis shortly after presentation. --cont dialysis with increased UF  Hyperkalemia, resolved Secondary to end-stage renal disease in the setting of ARB use D/c'ed irbesartan Twelve-lead EKG showed no acute findings Patient received calcium gluconate and Lokelma as well as emergent renal replacement therapy  ESRD on dialysis ([place]) MWF --iHD per nephro  Anemia due to chronic kidney disease Patient has anemia due to chronic kidney disease but is noted to have a significant drop in his H&H over the last 1 month 10.9 >> 8.6 --anemia workup showed iron def --cont IV iron x 3 doses  Schizophrenia (HCC) Continue Artane and fluphenazine  HTN --on amlodipine, clonidine  patch, hydralazine, Imdur, doxazosin and minoxidil --cont current regimen   DVT prophylaxis: Heparin SQ Code Status: Full code  Family Communication:  Level of care: Med-Surg Dispo:   The patient is from: group home Anticipated d/c is to: group home Anticipated d/c date is: 05/27/22   Subjective and Interval History:  Pt reported doing ok.   Objective: Vitals:   05/23/22 0213 05/23/22 0812 05/23/22 1351 05/23/22 1715  BP: 116/75 118/62  135/72  Pulse: 66 61  75  Resp: 18 18  17   Temp: 98.6 F (37 C) 98.7 F (37.1 C)  99.2 F (37.3 C)  TempSrc:      SpO2: 98% 96% 98% 95%  Weight:      Height:        Intake/Output Summary (Last 24 hours) at 05/23/2022 1728 Last data filed at 05/23/2022 1600 Gross per 24 hour  Intake 513.74 ml  Output 1500 ml  Net -986.26 ml   Filed Weights   05/20/22 1011 05/22/22 1617 05/22/22 1929  Weight: 81.6 kg 81.6 kg 80.6 kg    Examination:   Constitutional: NAD, alert HEENT: conjunctivae and lids normal, EOMI CV: No cyanosis.   RESP: normal respiratory effort, on 3L   Data Reviewed: I have personally reviewed labs and imaging studies  Time spent: 25 minutes  Enzo Bi, MD [facility] If 7PM-7AM, please contact night-coverage 05/23/2022, 5:28 PM

## 2022-05-23 NOTE — TOC Progression Note (Signed)
Transition of Care [facility]) - Progression Note    Patient Details  Name: Todd Brown MRN: [id number] Date of Birth: 1960/11/25  [facility]) CM/SW Contact  Izola Price, RN Phone Number: 05/23/2022, 11:18 AM  Clinical Narrative: 12/23: Patient is unable to return to [place] [facility] [place] per Melida Quitter at [phone], due to Covid status, patient unable to comply with protocol, and no private rooms at this time. Earliest can return is 05/27/22. Updated provider. Simmie Davies RN CM         Barriers to Discharge: Continued Medical Work up  Expected Discharge Plan and Services   Discharge Planning Services: CM Consult   Living arrangements for the past 2 months: [place]                                       Social Determinants of Health (SDOH) Interventions SDOH Screenings   Food Insecurity: No Food Insecurity (10/08/2018)  Transportation Needs: No Transportation Needs (10/08/2018)  Alcohol Screen: Low Risk  (10/05/2019)  Financial Resource Strain: Low Risk  (10/08/2018)  Physical Activity: Unknown (10/08/2018)  Social Connections: Unknown (10/08/2018)  Stress: No Stress Concern Present (10/08/2018)  Tobacco Use: Low Risk  (05/20/2022)    Readmission Risk Interventions     No data to display

## 2022-05-23 NOTE — Plan of Care (Signed)

## 2022-05-23 NOTE — Plan of Care (Signed)
  Problem: Health Behavior/Discharge Planning: Goal: Ability to manage health-related needs will improve Outcome: Progressing   Problem: Clinical Measurements: Goal: Ability to maintain clinical measurements within normal limits will improve Outcome: Progressing Goal: Will remain free from infection Outcome: Progressing   Problem: Activity: Goal: Risk for activity intolerance will decrease Outcome: Progressing   

## 2022-05-23 NOTE — Progress Notes (Signed)
[facility]  ROUNDING NOTE   Subjective:   Todd Brown is a 61 year old male with past medical conditions including COPD, hypertension, DVT, schizophrenia, and end-stage renal disease on hemodialysis.  Patient presents to the emergency department with complaints of hypoxia and weakness and has been admitted under observation for Hyperkalemia [E87.5] Weakness [R53.1] Acute on chronic respiratory failure with hypoxia (HCC) [J96.21] Acute on chronic hypoxic respiratory failure ([place]) [J96.21]  Patient is known to our practice and receives outpatient dialysis treatments at [facility] on a MWF schedule, supervised by Dr. Holley Raring.    Patient seen resting in bed, alert and oriented Remains on 4 L nasal cannula Scheduled to receive dialysis tomorrow No lower extremity edema   Objective:  Vital signs in last 24 hours:  Temp:  [98.5 F (36.9 C)-99.5 F (37.5 C)] 98.7 F (37.1 C) (12/23 0812) Pulse Rate:  [60-85] 61 (12/23 0812) Resp:  [16-20] 18 (12/23 0812) BP: (100-161)/(47-89) 118/62 (12/23 0812) SpO2:  [96 %-100 %] 96 % (12/23 0812) Weight:  [80.6 kg-81.6 kg] 80.6 kg (12/22 1929)  Weight change:  Filed Weights   05/20/22 1011 05/22/22 1617 05/22/22 1929  Weight: 81.6 kg 81.6 kg 80.6 kg    Intake/Output: I/O last 3 completed shifts: In: 549 [P.O.:480; IV Piggyback:69] Out: 1500 [Other:1500]   Intake/Output this shift:  No intake/output data recorded.  Physical Exam: General: NAD  Head: Normocephalic, atraumatic. Moist oral mucosal membranes  Eyes: Anicteric  Lungs:  Fine crackles at bases, normal effort  Heart: Regular rate and rhythm  Abdomen:  Soft, nontender,   Extremities: No peripheral edema.  Neurologic: Alert and oriented to self and place  Skin: No lesions  Access: Right chest PermCath    Basic Metabolic Panel: Recent Labs  Lab 05/20/22 1007 05/21/22 0447 05/22/22 0606 05/23/22 0620  NA 137 136 134* 135  K 6.8* 4.2 4.4 4.1  CL  96* 96* 95* 96*  CO2 28 29 25 30   GLUCOSE 79 74 129* 77  BUN 53* 37* 55* 40*  CREATININE 7.48* 5.38* 7.12* 5.43*  CALCIUM 10.0 9.0 8.6* 8.5*  MG 2.2  --  1.8 1.8  PHOS 3.7  --   --   --      Liver Function Tests: Recent Labs  Lab 05/20/22 1007  AST 41  ALT 22  ALKPHOS 103  BILITOT 1.2  PROT 7.7  ALBUMIN 3.9    No results for input(s): "LIPASE", "AMYLASE" in the last 168 hours. No results for input(s): "AMMONIA" in the last 168 hours.  CBC: Recent Labs  Lab 05/20/22 1007 05/21/22 0447 05/22/22 0606 05/23/22 0620  WBC 7.7 4.9 4.4 3.6*  HGB 8.6* 7.5* 7.3* 7.1*  HCT 26.8* 23.0* 22.8* 21.3*  MCV 103.1* 101.8* 103.2* 99.5  PLT 173 142* 132* 153     Cardiac Enzymes: No results for input(s): "CKTOTAL", "CKMB", "CKMBINDEX", "TROPONINI" in the last 168 hours.  BNP: Invalid input(s): "POCBNP"  CBG: Recent Labs  Lab 05/20/22 1133 05/20/22 1152 05/20/22 1857  GLUCAP 60* 105* 96     Microbiology: Results for orders placed or performed during the hospital encounter of 05/20/22  Resp panel by RT-PCR (RSV, Flu A&B, Covid) Anterior Nasal Swab     Status: Abnormal   Collection Time: 05/20/22 10:04 AM   Specimen: Anterior Nasal Swab  Result Value Ref Range Status   SARS Coronavirus 2 by RT PCR POSITIVE (A) NEGATIVE Final    Comment: (NOTE) SARS-CoV-2 target nucleic acids are DETECTED.  The  SARS-CoV-2 RNA is generally detectable in upper respiratory specimens during the acute phase of infection. Positive results are indicative of the presence of the identified virus, but do not rule out bacterial infection or co-infection with other pathogens not detected by the test. Clinical correlation with patient history and other diagnostic information is necessary to determine patient infection status. The expected result is Negative.  Fact Sheet for Patients: EntrepreneurPulse.com.au  Fact Sheet for Healthcare  Providers: IncredibleEmployment.be  This test is not yet approved or cleared by the [place] FDA and  has been authorized for detection and/or diagnosis of SARS-CoV-2 by FDA under an Emergency Use Authorization (EUA).  This EUA will remain in effect (meaning this test can be used) for the duration of  the COVID-19 declaration under Section 564(b)(1) of the A ct, 21 U.S.C. section 360bbb-3(b)(1), unless the authorization is terminated or revoked sooner.     Influenza A by PCR NEGATIVE NEGATIVE Final   Influenza B by PCR NEGATIVE NEGATIVE Final    Comment: (NOTE) The Xpert Xpress SARS-CoV-2/FLU/RSV plus assay is intended as an aid in the diagnosis of influenza from Nasopharyngeal swab specimens and should not be used as a sole basis for treatment. Nasal washings and aspirates are unacceptable for Xpert Xpress SARS-CoV-2/FLU/RSV testing.  Fact Sheet for Patients: EntrepreneurPulse.com.au  Fact Sheet for Healthcare Providers: IncredibleEmployment.be  This test is not yet approved or cleared by the [place] FDA and has been authorized for detection and/or diagnosis of SARS-CoV-2 by FDA under an Emergency Use Authorization (EUA). This EUA will remain in effect (meaning this test can be used) for the duration of the COVID-19 declaration under Section 564(b)(1) of the Act, 21 U.S.C. section 360bbb-3(b)(1), unless the authorization is terminated or revoked.     Resp Syncytial Virus by PCR NEGATIVE NEGATIVE Final    Comment: (NOTE) Fact Sheet for Patients: EntrepreneurPulse.com.au  Fact Sheet for Healthcare Providers: IncredibleEmployment.be  This test is not yet approved or cleared by the [place] FDA and has been authorized for detection and/or diagnosis of SARS-CoV-2 by FDA under an Emergency Use Authorization (EUA). This EUA will remain in effect (meaning this test can be  used) for the duration of the COVID-19 declaration under Section 564(b)(1) of the Act, 21 U.S.C. section 360bbb-3(b)(1), unless the authorization is terminated or revoked.  Performed at [facility], [address] [place], [place] [address]     Coagulation Studies: No results for input(s): "LABPROT", "INR" in the last 72 hours.  Urinalysis: No results for input(s): "COLORURINE", "LABSPEC", "PHURINE", "GLUCOSEU", "HGBUR", "BILIRUBINUR", "KETONESUR", "PROTEINUR", "UROBILINOGEN", "NITRITE", "LEUKOCYTESUR" in the last 72 hours.  Invalid input(s): "APPERANCEUR"    Imaging: No results found.   Medications:    sodium chloride     iron sucrose 200 mg (05/22/22 1853)   remdesivir 100 mg in sodium chloride 0.9 % 100 mL IVPB Stopped (05/22/22 1146)    albuterol  2 puff Inhalation Q6H   amLODipine  10 mg Oral q morning   vitamin C  500 mg Oral Daily   calcium acetate  1,334 mg Oral TID WC   carbamazepine  1,000 mg Oral q morning   Chlorhexidine Gluconate Cloth  6 each Topical Q0600   cholecalciferol  1,000 Units Oral Daily   [START ON 05/27/2022] cloNIDine  0.2 mg Transdermal Weekly   dexamethasone  6 mg Oral Daily   doxazosin  4 mg Oral QHS   ferrous sulfate  325 mg Oral Daily   fluPHENAZine  2.5 mg  Oral Daily   heparin  5,000 Units Subcutaneous Q8H   hydrALAZINE  100 mg Oral TID   insulin aspart  5 Units Intravenous Once   isosorbide mononitrate  60 mg Oral BID   minoxidil  2.5 mg Oral Daily   mometasone-formoterol  2 puff Inhalation BID   multivitamin  1 tablet Oral QHS   pantoprazole  40 mg Oral Daily   trihexyphenidyl  2 mg Oral BID   zinc sulfate  220 mg Oral Daily   sodium chloride, acetaminophen, guaiFENesin-dextromethorphan, ondansetron **OR** ondansetron (ZOFRAN) IV, polyethylene glycol  Assessment/ Plan:  [name] is a 61 y.o.  male  with past medical conditions including COPD, hypertension, DVT, schizophrenia, and end-stage renal disease  on hemodialysis.  Patient presents to the emergency department with complaints of hypoxia and weakness and has been admitted under observation for Hyperkalemia [E87.5] Weakness [R53.1] Acute on chronic respiratory failure with hypoxia (HCC) [J96.21] Acute on chronic hypoxic respiratory failure ([place]) [J96.21]   [facility]/MWF/Lt AVF   End-stage renal disease on hemodialysis.  Potassium corrected with dialysis.  Next treatment scheduled for Sunday. (Holiday schedule)  2. Anemia of chronic kidney disease Lab Results  Component Value Date   HGB 7.1 (L) 05/23/2022    Hemoglobin below desired target. Restart EPO with HD  3. Secondary Hyperparathyroidism:  Lab Results  Component Value Date   PTH 194 (H) 05/11/2017   CALCIUM 8.5 (L) 05/23/2022   CAION 1.19 03/27/2022   PHOS 3.7 05/20/2022    Bone minerals acceptable at this time.  4.  Hypertension with chronic kidney disease.  Patient receives amlodipine, clonidine, hydralazine, irbesartan, isosorbide, doxazosin and minoxidil outpatient.  Currently receiving these medications. Blood pressure control is acceptable.   LOS: 2 Jaxxen Voong 12/23/202311:51 AM

## 2022-05-24 DIAGNOSIS — J9621 Acute and chronic respiratory failure with hypoxia: Secondary | ICD-10-CM | POA: Diagnosis not present

## 2022-05-24 LAB — BASIC METABOLIC PANEL
Anion gap: 10 (ref 5–15)
BUN: 55 mg/dL — ABNORMAL HIGH (ref 8–23)
CO2: 28 mmol/L (ref 22–32)
Calcium: 8.8 mg/dL — ABNORMAL LOW (ref 8.9–10.3)
Chloride: 97 mmol/L — ABNORMAL LOW (ref 98–111)
Creatinine, Ser: 6.74 mg/dL — ABNORMAL HIGH (ref 0.61–1.24)
GFR, Estimated: 9 mL/min — ABNORMAL LOW (ref >60)
Glucose, Bld: 90 mg/dL (ref 70–99)
Potassium: 4.5 mmol/L (ref 3.5–5.1)
Sodium: 135 mmol/L (ref 135–145)

## 2022-05-24 LAB — MAGNESIUM: Magnesium: 2 mg/dL (ref 1.7–2.4)

## 2022-05-24 LAB — CBC
HCT: 22.6 % — ABNORMAL LOW (ref 39.0–52.0)
Hemoglobin: 7.4 g/dL — ABNORMAL LOW (ref 13.0–17.0)
MCH: 32.9 pg (ref 26.0–34.0)
MCHC: 32.7 g/dL (ref 30.0–36.0)
MCV: 100.4 fL — ABNORMAL HIGH (ref 80.0–100.0)
Platelets: 168 10*3/uL (ref 150–400)
RBC: 2.25 MIL/uL — ABNORMAL LOW (ref 4.22–5.81)
RDW: 16 % — ABNORMAL HIGH (ref 11.5–15.5)
WBC: 3.7 10*3/uL — ABNORMAL LOW (ref 4.0–10.5)
nRBC: 0 % (ref 0.0–0.2)

## 2022-05-24 MED ORDER — HEPARIN SODIUM (PORCINE) 1000 UNIT/ML IJ SOLN
INTRAMUSCULAR | Status: AC
  Filled 2022-05-24: qty 10

## 2022-05-24 MED ORDER — EPOETIN ALFA 10000 UNIT/ML IJ SOLN: 4000.0000 [IU] | Freq: Once | INTRAMUSCULAR | Status: AC

## 2022-05-24 MED ORDER — EPOETIN ALFA 4000 UNIT/ML IJ SOLN
INTRAMUSCULAR | Status: AC
  Filled 2022-05-24: qty 1

## 2022-05-24 NOTE — Progress Notes (Signed)
[facility]  ROUNDING NOTE   Subjective:   SHELDON SEM is a 61 year old male with past medical conditions including COPD, hypertension, DVT, schizophrenia, and end-stage renal disease on hemodialysis.  Patient presents to the emergency department with complaints of hypoxia and weakness and has been admitted under observation for Hyperkalemia [E87.5] Weakness [R53.1] Acute on chronic respiratory failure with hypoxia (HCC) [J96.21] Acute on chronic hypoxic respiratory failure ([place]) [J96.21]  Patient is known to our practice and receives outpatient dialysis treatments at [facility] on a MWF schedule, supervised by Dr. Holley Raring.    Patient seen resting in bed, alert and oriented Remains on 2 L nasal cannula Scheduled to receive dialysis later today No lower extremity edema   Objective:  Vital signs in last 24 hours:  Temp:  [98.5 F (36.9 C)-99.2 F (37.3 C)] 98.5 F (36.9 C) (12/23 2312) Pulse Rate:  [64-75] 64 (12/23 2312) Resp:  [16-17] 16 (12/23 2312) BP: (124-135)/(68-72) 124/68 (12/23 2312) SpO2:  [95 %-100 %] 100 % (12/23 2312)  Weight change:  Filed Weights   05/20/22 1011 05/22/22 1617 05/22/22 1929  Weight: 81.6 kg 81.6 kg 80.6 kg    Intake/Output: I/O last 3 completed shifts: In: 273.7 [IV Piggyback:273.7] Out: 1500 [Other:1500]   Intake/Output this shift:  No intake/output data recorded.  Physical Exam: General: NAD  Head: Normocephalic, atraumatic. Moist oral mucosal membranes  Eyes: Anicteric  Lungs:  [place] O2, normal effort  Heart: Regular rate and rhythm  Abdomen:  Soft, nontender,   Extremities: No peripheral edema.  Neurologic: Alert and oriented to self and place  Skin: No lesions  Access: Right chest PermCath    Basic Metabolic Panel: Recent Labs  Lab 05/20/22 1007 05/21/22 0447 05/22/22 0606 05/23/22 0620 05/24/22 0520  NA 137 136 134* 135 135  K 6.8* 4.2 4.4 4.1 4.5  CL 96* 96* 95* 96* 97*  CO2 28 29 25 30 28    GLUCOSE 79 74 129* 77 90  BUN 53* 37* 55* 40* 55*  CREATININE 7.48* 5.38* 7.12* 5.43* 6.74*  CALCIUM 10.0 9.0 8.6* 8.5* 8.8*  MG 2.2  --  1.8 1.8 2.0  PHOS 3.7  --   --   --   --      Liver Function Tests: Recent Labs  Lab 05/20/22 1007  AST 41  ALT 22  ALKPHOS 103  BILITOT 1.2  PROT 7.7  ALBUMIN 3.9    No results for input(s): "LIPASE", "AMYLASE" in the last 168 hours. No results for input(s): "AMMONIA" in the last 168 hours.  CBC: Recent Labs  Lab 05/20/22 1007 05/21/22 0447 05/22/22 0606 05/23/22 0620 05/24/22 0520  WBC 7.7 4.9 4.4 3.6* 3.7*  HGB 8.6* 7.5* 7.3* 7.1* 7.4*  HCT 26.8* 23.0* 22.8* 21.3* 22.6*  MCV 103.1* 101.8* 103.2* 99.5 100.4*  PLT 173 142* 132* 153 168     Cardiac Enzymes: No results for input(s): "CKTOTAL", "CKMB", "CKMBINDEX", "TROPONINI" in the last 168 hours.  BNP: Invalid input(s): "POCBNP"  CBG: Recent Labs  Lab 05/20/22 1133 05/20/22 1152 05/20/22 1857  GLUCAP 60* 105* 50     Microbiology: Results for orders placed or performed during the hospital encounter of 05/20/22  Resp panel by RT-PCR (RSV, Flu A&B, Covid) Anterior Nasal Swab     Status: Abnormal   Collection Time: 05/20/22 10:04 AM   Specimen: Anterior Nasal Swab  Result Value Ref Range Status   SARS Coronavirus 2 by RT PCR POSITIVE (A) NEGATIVE Final  Comment: (NOTE) SARS-CoV-2 target nucleic acids are DETECTED.  The SARS-CoV-2 RNA is generally detectable in upper respiratory specimens during the acute phase of infection. Positive results are indicative of the presence of the identified virus, but do not rule out bacterial infection or co-infection with other pathogens not detected by the test. Clinical correlation with patient history and other diagnostic information is necessary to determine patient infection status. The expected result is Negative.  Fact Sheet for Patients: EntrepreneurPulse.com.au  Fact Sheet for Healthcare  Providers: IncredibleEmployment.be  This test is not yet approved or cleared by the [place] FDA and  has been authorized for detection and/or diagnosis of SARS-CoV-2 by FDA under an Emergency Use Authorization (EUA).  This EUA will remain in effect (meaning this test can be used) for the duration of  the COVID-19 declaration under Section 564(b)(1) of the A ct, 21 U.S.C. section 360bbb-3(b)(1), unless the authorization is terminated or revoked sooner.     Influenza A by PCR NEGATIVE NEGATIVE Final   Influenza B by PCR NEGATIVE NEGATIVE Final    Comment: (NOTE) The Xpert Xpress SARS-CoV-2/FLU/RSV plus assay is intended as an aid in the diagnosis of influenza from Nasopharyngeal swab specimens and should not be used as a sole basis for treatment. Nasal washings and aspirates are unacceptable for Xpert Xpress SARS-CoV-2/FLU/RSV testing.  Fact Sheet for Patients: EntrepreneurPulse.com.au  Fact Sheet for Healthcare Providers: IncredibleEmployment.be  This test is not yet approved or cleared by the [place] FDA and has been authorized for detection and/or diagnosis of SARS-CoV-2 by FDA under an Emergency Use Authorization (EUA). This EUA will remain in effect (meaning this test can be used) for the duration of the COVID-19 declaration under Section 564(b)(1) of the Act, 21 U.S.C. section 360bbb-3(b)(1), unless the authorization is terminated or revoked.     Resp Syncytial Virus by PCR NEGATIVE NEGATIVE Final    Comment: (NOTE) Fact Sheet for Patients: EntrepreneurPulse.com.au  Fact Sheet for Healthcare Providers: IncredibleEmployment.be  This test is not yet approved or cleared by the [place] FDA and has been authorized for detection and/or diagnosis of SARS-CoV-2 by FDA under an Emergency Use Authorization (EUA). This EUA will remain in effect (meaning this test can be  used) for the duration of the COVID-19 declaration under Section 564(b)(1) of the Act, 21 U.S.C. section 360bbb-3(b)(1), unless the authorization is terminated or revoked.  Performed at [facility], [address] [place], [place] [address]     Coagulation Studies: No results for input(s): "LABPROT", "INR" in the last 72 hours.  Urinalysis: No results for input(s): "COLORURINE", "LABSPEC", "PHURINE", "GLUCOSEU", "HGBUR", "BILIRUBINUR", "KETONESUR", "PROTEINUR", "UROBILINOGEN", "NITRITE", "LEUKOCYTESUR" in the last 72 hours.  Invalid input(s): "APPERANCEUR"    Imaging: No results found.   Medications:    sodium chloride      albuterol  2 puff Inhalation Q6H   amLODipine  10 mg Oral q morning   vitamin C  500 mg Oral Daily   calcium acetate  1,334 mg Oral TID WC   carbamazepine  1,000 mg Oral q morning   Chlorhexidine Gluconate Cloth  6 each Topical Q0600   cholecalciferol  1,000 Units Oral Daily   [START ON 05/27/2022] cloNIDine  0.2 mg Transdermal Weekly   dexamethasone  6 mg Oral Daily   doxazosin  4 mg Oral QHS   [START ON 05/25/2022] epoetin (EPOGEN/PROCRIT) injection  4,000 Units Intravenous Q M,W,F-HD   ferrous sulfate  325 mg Oral Daily   fluPHENAZine  2.5 mg  Oral Daily   heparin  5,000 Units Subcutaneous Q8H   hydrALAZINE  100 mg Oral TID   insulin aspart  5 Units Intravenous Once   isosorbide mononitrate  60 mg Oral BID   minoxidil  2.5 mg Oral Daily   mometasone-formoterol  2 puff Inhalation BID   multivitamin  1 tablet Oral QHS   pantoprazole  40 mg Oral Daily   trihexyphenidyl  2 mg Oral BID   zinc sulfate  220 mg Oral Daily   sodium chloride, acetaminophen, guaiFENesin-dextromethorphan, ondansetron **OR** ondansetron (ZOFRAN) IV, polyethylene glycol  Assessment/ Plan:  Mr. ENZIO BUCHLER is a 61 y.o.  male  with past medical conditions including COPD, hypertension, DVT, schizophrenia, and end-stage renal disease on hemodialysis.  Patient  presents to the emergency department with complaints of hypoxia and weakness and has been admitted under observation for Hyperkalemia [E87.5] Weakness [R53.1] Acute on chronic respiratory failure with hypoxia (HCC) [J96.21] Acute on chronic hypoxic respiratory failure ([place]) [J96.21]   [facility]/MWF/Lt AVF   End-stage renal disease on hemodialysis.  Hemodialysis today due to holiday schedule.  Then back to normal schedule of MWF.  2. Anemia of chronic kidney disease Lab Results  Component Value Date   HGB 7.4 (L) 05/24/2022    Hemoglobin below desired target.  Patient receives Mircera at outpatient clinic.   Currently on Epogen Will continue to monitor.  3. Secondary Hyperparathyroidism: Presumed Lab Results  Component Value Date   PTH 194 (H) 05/11/2017   CALCIUM 8.8 (L) 05/24/2022   CAION 1.19 03/27/2022   PHOS 3.7 05/20/2022    Bone minerals acceptable at this time.  4.  Hypertension with chronic kidney disease.  Patient receives amlodipine, clonidine, hydralazine, irbesartan, isosorbide, doxazosin and minoxidil outpatient.  Currently receiving these medications. Blood pressure acceptable,  5.  COVID-19 infection.  Completed remdesivir on 05/22/2022.   LOS: 3 Tyshae Stair 12/24/202312:04 PM

## 2022-05-24 NOTE — Progress Notes (Signed)
Post hd rn assessment 

## 2022-05-24 NOTE — Progress Notes (Signed)
Pre hd rn assessment 

## 2022-05-24 NOTE — Plan of Care (Signed)
  Problem: Clinical Measurements: Goal: Ability to maintain clinical measurements within normal limits will improve Outcome: Progressing Goal: Will remain free from infection Outcome: Progressing Goal: Diagnostic test results will improve Outcome: Progressing Goal: Respiratory complications will improve Outcome: Progressing Goal: Cardiovascular complication will be avoided Outcome: Progressing   Problem: Activity: Goal: Risk for activity intolerance will decrease Outcome: Progressing   Problem: Nutrition: Goal: Adequate nutrition will be maintained Outcome: Progressing   Problem: Safety: Goal: Ability to remain free from injury will improve Outcome: Progressing

## 2022-05-24 NOTE — Progress Notes (Signed)
  PROGRESS NOTE    Todd Brown  [id number]:[id number] DOB: 1961/02/04 DOA: 05/20/2022 PCP: [name], NP  [id number]  LOS: 3 days   Brief hospital course:   Assessment & Plan: Todd Brown is a 61 y.o. 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  Patient was said to be very weak and unable to get out of bed and so could not go to the dialysis center.   When EMS arrived patient was hypoxic to 74% on his 2 L nasal cannula they placed him on a nonrebreather.   Acute on chronic hypoxemic respiratory failure COPD on 2L at baseline Hypoxia appears to be multifactorial and related to volume overload from end-stage renal disease as well as from COVID-19 viral infection. --treat volume overload with dialysis --treat covid infection  COVID-19 virus infection --started on IV decadron, switch to oral --completed 3 days of Remdesivir Plan: --cont oral decadron  Pulm edema --appears to have chronic pulm edema, CXR on presentation showed pulm edema mildly worsened.   --received dialysis shortly after presentation. --cont dialysis with increased UF  Hyperkalemia, resolved Secondary to end-stage renal disease in the setting of ARB use D/c'ed irbesartan Twelve-lead EKG showed no acute findings Patient received calcium gluconate and Lokelma as well as emergent renal replacement therapy  ESRD on dialysis ([place]) MWF --iHD per nephro  Anemia due to chronic kidney disease Patient has anemia due to chronic kidney disease but is noted to have a significant drop in his H&H over the last 1 month 10.9 >> 8.6 --anemia workup showed iron def --cont IV iron x 3 doses  Schizophrenia (HCC) Continue Artane and fluphenazine  HTN --on amlodipine, clonidine patch, hydralazine,  Imdur, doxazosin and minoxidil --cont current regimen   DVT prophylaxis: Heparin SQ Code Status: Full code  Family Communication:  Level of care: Med-Surg Dispo:   The patient is from: group home Anticipated d/c is to: group home Anticipated d/c date is: 05/27/22 after covid isolation   Subjective and Interval History:  Pt was asking where was his lunch.   Objective: Vitals:   05/24/22 1642 05/24/22 1647 05/24/22 1652 05/24/22 1700  BP: (!) 156/75  (!) 156/75 (!) 171/78  Pulse: 74 72 69 66  Resp: 12 19 16  (!) 21  Temp: 98.7 F (37.1 C)     TempSrc: Oral     SpO2: 100%  98% 98%  Weight: 76.9 kg     Height:       No intake or output data in the 24 hours ending 05/24/22 [place]   05/22/22 1617 05/22/22 1929 05/24/22 1642  Weight: 81.6 kg 80.6 kg 76.9 kg    Examination:   Constitutional: NAD, alert, speech difficult to understand HEENT: conjunctivae and lids normal, EOMI CV: No cyanosis.   RESP: normal respiratory effort, on 2L SKIN: warm, dry Neuro: II - XII grossly intact.     Data Reviewed: I have personally reviewed labs and imaging studies  Time spent: 25 minutes  Enzo Bi, MD [facility] If 7PM-7AM, please contact night-coverage 05/24/2022, 5:25 PM

## 2022-05-24 NOTE — Progress Notes (Signed)
Pt completed 2.5 hour HD treatment w/ no complications. Alert, vss, no c/o, report to primary RN.  Start: 2297 End: 1925 1563ml fluid removed 60L BVP 75.4kg post hd bed weight Epogen 4000u given with HD

## 2022-05-25 DIAGNOSIS — J9621 Acute and chronic respiratory failure with hypoxia: Secondary | ICD-10-CM | POA: Diagnosis not present

## 2022-05-25 LAB — CBC
HCT: 21 % — ABNORMAL LOW (ref 39.0–52.0)
Hemoglobin: 6.9 g/dL — ABNORMAL LOW (ref 13.0–17.0)
MCH: 33 pg (ref 26.0–34.0)
MCHC: 32.9 g/dL (ref 30.0–36.0)
MCV: 100.5 fL — ABNORMAL HIGH (ref 80.0–100.0)
Platelets: 172 10*3/uL (ref 150–400)
RBC: 2.09 MIL/uL — ABNORMAL LOW (ref 4.22–5.81)
RDW: 15.9 % — ABNORMAL HIGH (ref 11.5–15.5)
WBC: 4.7 10*3/uL (ref 4.0–10.5)
nRBC: 0 % (ref 0.0–0.2)

## 2022-05-25 LAB — BASIC METABOLIC PANEL
Anion gap: 9 (ref 5–15)
BUN: 42 mg/dL — ABNORMAL HIGH (ref 8–23)
CO2: 29 mmol/L (ref 22–32)
Calcium: 8.7 mg/dL — ABNORMAL LOW (ref 8.9–10.3)
Chloride: 97 mmol/L — ABNORMAL LOW (ref 98–111)
Creatinine, Ser: 5.45 mg/dL — ABNORMAL HIGH (ref 0.61–1.24)
GFR, Estimated: 11 mL/min — ABNORMAL LOW (ref >60)
Glucose, Bld: 81 mg/dL (ref 70–99)
Potassium: 4.5 mmol/L (ref 3.5–5.1)
Sodium: 135 mmol/L (ref 135–145)

## 2022-05-25 LAB — PREPARE RBC (CROSSMATCH)

## 2022-05-25 LAB — MAGNESIUM: Magnesium: 1.9 mg/dL (ref 1.7–2.4)

## 2022-05-25 MED ORDER — SODIUM CHLORIDE 0.9% IV SOLUTION: Freq: Once | INTRAVENOUS | Status: AC

## 2022-05-25 NOTE — Progress Notes (Signed)
  PROGRESS NOTE    Todd Brown  [id number]:[id number] DOB: December 13, 1960 DOA: 05/20/2022 PCP: [name], NP  [id number]/[id number]  LOS: 4 days   Brief hospital course:   Assessment & Plan: Todd Brown is a 61 y.o. 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  Patient was said to be very weak and unable to get out of bed and so could not go to the dialysis center.   When EMS arrived patient was hypoxic to 74% on his 2 L nasal cannula they placed him on a nonrebreather.   Acute on chronic hypoxemic respiratory failure COPD on 2L at baseline Hypoxia appears to be multifactorial and related to volume overload from end-stage renal disease as well as from COVID-19 viral infection. --treat volume overload with dialysis --treat covid infection  COVID-19 virus infection --started on IV decadron, switch to oral --completed 3 days of Remdesivir Plan: --cont oral decadron  Pulm edema --appears to have chronic pulm edema, CXR on presentation showed pulm edema mildly worsened.   --received dialysis shortly after presentation. --cont dialysis with increased UF  Hyperkalemia, resolved Secondary to end-stage renal disease in the setting of ARB use D/c'ed irbesartan Twelve-lead EKG showed no acute findings Patient received calcium gluconate and Lokelma as well as emergent renal replacement therapy  ESRD on dialysis ([place]) MWF --iHD per nephro  Anemia due to chronic kidney disease Patient has anemia due to chronic kidney disease but is noted to have a significant drop in his H&H over the last 1 month 10.9 >> 8.6 --anemia workup showed iron def --completed IV iron x 3 doses --1u pRBC today for Hgb 6.9 (consent obtained from DSS)  Schizophrenia ([place]) Continue Artane and  fluphenazine  HTN --on amlodipine, clonidine patch, hydralazine, Imdur, doxazosin and minoxidil --cont current regimen   DVT prophylaxis: Heparin SQ Code Status: Full code  Family Communication:  Level of care: Med-Surg Dispo:   The patient is from: group home Anticipated d/c is to: group home Anticipated d/c date is: 05/27/22 after covid isolation   Subjective and Interval History:  Pt only talked about what he wanted for lunch.   Objective: Vitals:   05/24/22 2008 05/24/22 2245 05/25/22 0747 05/25/22 1447  BP: (!) 158/76 (!) 162/91 136/83 122/72  Pulse: 67  77 81  Resp: 19     Temp: 98.7 F (37.1 C)  98.1 F (36.7 C) 97.7 F (36.5 C)  TempSrc:      SpO2: 95%  95% 93%  Weight:      Height:        Intake/Output Summary (Last 24 hours) at 05/25/2022 1903 Last data filed at 05/24/2022 1925 Gross per 24 hour  Intake --  Output 1500 ml  Net -1500 ml    Filed Weights   05/22/22 1929 05/24/22 1642 05/24/22 1943  Weight: 80.6 kg 76.9 kg 75.4 kg    Examination:   Constitutional: NAD, alert HEENT: conjunctivae and lids normal, EOMI CV: No cyanosis.   RESP: normal respiratory effort, [place] off   Data Reviewed: I have personally reviewed labs and imaging studies  Time spent: 35 minutes  Todd Bi, MD [facility] If 7PM-7AM, please contact night-coverage 05/25/2022, 7:03 PM

## 2022-05-25 NOTE — Progress Notes (Signed)
Chief Strategy Officer received phone call from [facility] [phone]) at 1828 from [place] to give telephone consent for Pt to receive blood.Consent witnessed by [facility] and placed in Pt's paper chart. Dr. Billie Ruddy notified at [phone].

## 2022-05-25 NOTE — Plan of Care (Signed)

## 2022-05-25 NOTE — Plan of Care (Signed)
  Problem: Clinical Measurements: Goal: Ability to maintain clinical measurements within normal limits will improve Outcome: Progressing Goal: Will remain free from infection Outcome: Progressing Goal: Respiratory complications will improve Outcome: Progressing Goal: Cardiovascular complication will be avoided Outcome: Progressing   Problem: Activity: Goal: Risk for activity intolerance will decrease Outcome: Progressing   

## 2022-05-26 DIAGNOSIS — J9621 Acute and chronic respiratory failure with hypoxia: Secondary | ICD-10-CM | POA: Diagnosis not present

## 2022-05-26 LAB — CBC
HCT: 25.4 % — ABNORMAL LOW (ref 39.0–52.0)
Hemoglobin: 8.4 g/dL — ABNORMAL LOW (ref 13.0–17.0)
MCH: 32.1 pg (ref 26.0–34.0)
MCHC: 33.1 g/dL (ref 30.0–36.0)
MCV: 96.9 fL (ref 80.0–100.0)
Platelets: 177 10*3/uL (ref 150–400)
RBC: 2.62 MIL/uL — ABNORMAL LOW (ref 4.22–5.81)
RDW: 17.5 % — ABNORMAL HIGH (ref 11.5–15.5)
WBC: 5.9 10*3/uL (ref 4.0–10.5)
nRBC: 0.5 % — ABNORMAL HIGH (ref 0.0–0.2)

## 2022-05-26 LAB — BASIC METABOLIC PANEL
Anion gap: 11 (ref 5–15)
BUN: 56 mg/dL — ABNORMAL HIGH (ref 8–23)
CO2: 27 mmol/L (ref 22–32)
Calcium: 9.1 mg/dL (ref 8.9–10.3)
Chloride: 95 mmol/L — ABNORMAL LOW (ref 98–111)
Creatinine, Ser: 6.98 mg/dL — ABNORMAL HIGH (ref 0.61–1.24)
GFR, Estimated: 8 mL/min — ABNORMAL LOW (ref >60)
Glucose, Bld: 93 mg/dL (ref 70–99)
Potassium: 4.8 mmol/L (ref 3.5–5.1)
Sodium: 133 mmol/L — ABNORMAL LOW (ref 135–145)

## 2022-05-26 LAB — MAGNESIUM: Magnesium: 1.9 mg/dL (ref 1.7–2.4)

## 2022-05-26 NOTE — Progress Notes (Signed)
  PROGRESS NOTE    Todd Brown  [id number]:[id number] DOB: [birth year] DOA: 05/20/2022 PCP: [name], NP  [id number]/[id number]-AA  LOS: 5 days   Brief hospital course:   Assessment & Plan: Todd Brown is a 61 y.o. 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  Patient was said to be very weak and unable to get out of bed and so could not go to the dialysis center.   When EMS arrived patient was hypoxic to 74% on his 2 L nasal cannula they placed him on a nonrebreather.   Acute on chronic hypoxemic respiratory failure COPD on 2L at baseline Hypoxia appears to be multifactorial and related to volume overload from end-stage renal disease as well as from COVID-19 viral infection. --treat volume overload with dialysis --treat covid infection  COVID-19 virus infection --started on IV decadron, switch to oral --completed 3 days of Remdesivir --completed 7 days of decadron  Pulm edema --appears to have chronic pulm edema, CXR on presentation showed pulm edema mildly worsened.   --cont dialysis with increased UF  Hyperkalemia, resolved Secondary to end-stage renal disease in the setting of ARB use D/c'ed irbesartan Twelve-lead EKG showed no acute findings Patient received calcium gluconate and Lokelma as well as emergent renal replacement therapy  ESRD on dialysis ([place]) MWF --iHD per nephro  Anemia due to chronic kidney disease Patient has anemia due to chronic kidney disease but is noted to have a significant drop in his H&H over the last 1 month 10.9 >> 8.6 --anemia workup showed iron def --completed IV iron x 3 doses --s/p 1u pRBC for Hgb 6.9 (consent obtained from DSS)  Schizophrenia ([place]) Continue Artane and fluphenazine  HTN --on amlodipine, clonidine patch,  hydralazine, Imdur, doxazosin and minoxidil --cont current regimen   DVT prophylaxis: Heparin SQ Code Status: Full code  Family Communication:  Level of care: Med-Surg Dispo:   The patient is from: group home Anticipated d/c is to: group home Anticipated d/c date is: 05/27/22 after covid isolation   Subjective and Interval History:  No new issue today.   Objective: Vitals:   05/26/22 0031 05/26/22 0326 05/26/22 0957 05/26/22 2155  BP: 123/69 (!) 156/80 (!) 138/97 (!) 166/84  Pulse: 74 83 84 77  Resp: 16 16 20 18   Temp: 97.6 F (36.4 C) 98.3 F (36.8 C) 98.1 F (36.7 C) 98 F (36.7 C)  TempSrc:  Oral    SpO2:  93% 97% 95%  Weight:      Height:        Intake/Output Summary (Last 24 hours) at 05/26/2022 2218 Last data filed at 05/26/2022 1812 Gross per 24 hour  Intake 370 ml  Output 0 ml  Net 370 ml    Filed Weights   05/22/22 1929 05/24/22 1642 05/24/22 1943  Weight: 80.6 kg 76.9 kg 75.4 kg    Examination:   Constitutional: NAD, speech difficult to understand CV: No cyanosis.   RESP: normal respiratory effort, on 2L   Data Reviewed: I have personally reviewed labs and imaging studies  Time spent: 25 minutes  Enzo Bi, MD [facility] If 7PM-7AM, please contact night-coverage 05/26/2022, 10:18 PM

## 2022-05-26 NOTE — Plan of Care (Signed)

## 2022-05-26 NOTE — Progress Notes (Signed)
[facility]  ROUNDING NOTE   Subjective:   Todd Brown is a 61 year old male with past medical conditions including COPD, hypertension, DVT, schizophrenia, and end-stage renal disease on hemodialysis.  Patient presents to the emergency department with complaints of hypoxia and weakness and has been admitted under observation for Hyperkalemia [E87.5] Weakness [R53.1] Acute on chronic respiratory failure with hypoxia (HCC) [J96.21] Acute on chronic hypoxic respiratory failure ([place]) [J96.21]  Patient is known to our practice and receives outpatient dialysis treatments at [facility] on a MWF schedule, supervised by Dr. Holley Raring.    Patient seen resting in bed, alert and oriented Weaned to 2L [place] Denies cough and shortness of breath   Objective:  Vital signs in last 24 hours:  Temp:  [97.6 F (36.4 C)-98.3 F (36.8 C)] 98.1 F (36.7 C) (12/26 0957) Pulse Rate:  [74-84] 84 (12/26 0957) Resp:  [16-20] 20 (12/26 0957) BP: (123-156)/(69-141) 138/97 (12/26 0957) SpO2:  [93 %-98 %] 97 % (12/26 0957)  Weight change:  Filed Weights   05/22/22 1929 05/24/22 1642 05/24/22 1943  Weight: 80.6 kg 76.9 kg 75.4 kg    Intake/Output: I/O last 3 completed shifts: In: 770 [P.O.:400; Blood:370] Out: 1500 [Other:1500]   Intake/Output this shift:  No intake/output data recorded.  Physical Exam: General: NAD  Head: Normocephalic, atraumatic. Moist oral mucosal membranes  Eyes: Anicteric  Lungs:  Fine crackles at bases, normal effort  Heart: Regular rate and rhythm  Abdomen:  Soft, nontender  Extremities: No peripheral edema.  Neurologic: Alert and oriented to self and place  Skin: No lesions  Access: Right chest PermCath    Basic Metabolic Panel: Recent Labs  Lab 05/20/22 1007 05/21/22 0447 05/22/22 0606 05/23/22 0620 05/24/22 0520 05/25/22 0528 05/26/22 0455  NA 137   < > 134* 135 135 135 133*  K 6.8*   < > 4.4 4.1 4.5 4.5 4.8  CL 96*   < > 95* 96* 97*  97* 95*  CO2 28   < > 25 30 28 29 27   GLUCOSE 79   < > 129* 77 90 81 93  BUN 53*   < > 55* 40* 55* 42* 56*  CREATININE 7.48*   < > 7.12* 5.43* 6.74* 5.45* 6.98*  CALCIUM 10.0   < > 8.6* 8.5* 8.8* 8.7* 9.1  MG 2.2  --  1.8 1.8 2.0 1.9 1.9  PHOS 3.7  --   --   --   --   --   --    < > = values in this interval not displayed.     Liver Function Tests: Recent Labs  Lab 05/20/22 1007  AST 41  ALT 22  ALKPHOS 103  BILITOT 1.2  PROT 7.7  ALBUMIN 3.9    No results for input(s): "LIPASE", "AMYLASE" in the last 168 hours. No results for input(s): "AMMONIA" in the last 168 hours.  CBC: Recent Labs  Lab 05/22/22 0606 05/23/22 0620 05/24/22 0520 05/25/22 0528 05/26/22 0455  WBC 4.4 3.6* 3.7* 4.7 5.9  HGB 7.3* 7.1* 7.4* 6.9* 8.4*  HCT 22.8* 21.3* 22.6* 21.0* 25.4*  MCV 103.2* 99.5 100.4* 100.5* 96.9  PLT 132* 153 168 172 177     Cardiac Enzymes: No results for input(s): "CKTOTAL", "CKMB", "CKMBINDEX", "TROPONINI" in the last 168 hours.  BNP: Invalid input(s): "POCBNP"  CBG: Recent Labs  Lab 05/20/22 1133 05/20/22 1152 05/20/22 1857  GLUCAP 60* 105* 82     Microbiology: Results for orders placed or performed  during the hospital encounter of 05/20/22  Resp panel by RT-PCR (RSV, Flu A&B, Covid) Anterior Nasal Swab     Status: Abnormal   Collection Time: 05/20/22 10:04 AM   Specimen: Anterior Nasal Swab  Result Value Ref Range Status   SARS Coronavirus 2 by RT PCR POSITIVE (A) NEGATIVE Final    Comment: (NOTE) SARS-CoV-2 target nucleic acids are DETECTED.  The SARS-CoV-2 RNA is generally detectable in upper respiratory specimens during the acute phase of infection. Positive results are indicative of the presence of the identified virus, but do not rule out bacterial infection or co-infection with other pathogens not detected by the test. Clinical correlation with patient history and other diagnostic information is necessary to determine patient infection  status. The expected result is Negative.  Fact Sheet for Patients: EntrepreneurPulse.com.au  Fact Sheet for Healthcare Providers: IncredibleEmployment.be  This test is not yet approved or cleared by the [place] FDA and  has been authorized for detection and/or diagnosis of SARS-CoV-2 by FDA under an Emergency Use Authorization (EUA).  This EUA will remain in effect (meaning this test can be used) for the duration of  the COVID-19 declaration under Section 564(b)(1) of the A ct, 21 U.S.C. section 360bbb-3(b)(1), unless the authorization is terminated or revoked sooner.     Influenza A by PCR NEGATIVE NEGATIVE Final   Influenza B by PCR NEGATIVE NEGATIVE Final    Comment: (NOTE) The Xpert Xpress SARS-CoV-2/FLU/RSV plus assay is intended as an aid in the diagnosis of influenza from Nasopharyngeal swab specimens and should not be used as a sole basis for treatment. Nasal washings and aspirates are unacceptable for Xpert Xpress SARS-CoV-2/FLU/RSV testing.  Fact Sheet for Patients: EntrepreneurPulse.com.au  Fact Sheet for Healthcare Providers: IncredibleEmployment.be  This test is not yet approved or cleared by the [place] FDA and has been authorized for detection and/or diagnosis of SARS-CoV-2 by FDA under an Emergency Use Authorization (EUA). This EUA will remain in effect (meaning this test can be used) for the duration of the COVID-19 declaration under Section 564(b)(1) of the Act, 21 U.S.C. section 360bbb-3(b)(1), unless the authorization is terminated or revoked.     Resp Syncytial Virus by PCR NEGATIVE NEGATIVE Final    Comment: (NOTE) Fact Sheet for Patients: EntrepreneurPulse.com.au  Fact Sheet for Healthcare Providers: IncredibleEmployment.be  This test is not yet approved or cleared by the [place] FDA and has been authorized for detection  and/or diagnosis of SARS-CoV-2 by FDA under an Emergency Use Authorization (EUA). This EUA will remain in effect (meaning this test can be used) for the duration of the COVID-19 declaration under Section 564(b)(1) of the Act, 21 U.S.C. section 360bbb-3(b)(1), unless the authorization is terminated or revoked.  Performed at [facility], [address] [place], [place] [address]     Coagulation Studies: No results for input(s): "LABPROT", "INR" in the last 72 hours.  Urinalysis: No results for input(s): "COLORURINE", "LABSPEC", "PHURINE", "GLUCOSEU", "HGBUR", "BILIRUBINUR", "KETONESUR", "PROTEINUR", "UROBILINOGEN", "NITRITE", "LEUKOCYTESUR" in the last 72 hours.  Invalid input(s): "APPERANCEUR"    Imaging: No results found.   Medications:    sodium chloride      albuterol  2 puff Inhalation Q6H   amLODipine  10 mg Oral q morning   vitamin C  500 mg Oral Daily   calcium acetate  1,334 mg Oral TID WC   carbamazepine  1,000 mg Oral q morning   Chlorhexidine Gluconate Cloth  6 each Topical Q0600   cholecalciferol  1,000 Units  Oral Daily   [START ON 05/27/2022] cloNIDine  0.2 mg Transdermal Weekly   dexamethasone  6 mg Oral Daily   doxazosin  4 mg Oral QHS   epoetin (EPOGEN/PROCRIT) injection  4,000 Units Intravenous Q M,W,F-HD   ferrous sulfate  325 mg Oral Daily   fluPHENAZine  2.5 mg Oral Daily   heparin  5,000 Units Subcutaneous Q8H   hydrALAZINE  100 mg Oral TID   insulin aspart  5 Units Intravenous Once   isosorbide mononitrate  60 mg Oral BID   minoxidil  2.5 mg Oral Daily   mometasone-formoterol  2 puff Inhalation BID   multivitamin  1 tablet Oral QHS   pantoprazole  40 mg Oral Daily   trihexyphenidyl  2 mg Oral BID   zinc sulfate  220 mg Oral Daily   sodium chloride, acetaminophen, guaiFENesin-dextromethorphan, ondansetron **OR** ondansetron (ZOFRAN) IV, polyethylene glycol  Assessment/ Plan:  Todd Brown is a 61 y.o.  male  with past  medical conditions including COPD, hypertension, DVT, schizophrenia, and end-stage renal disease on hemodialysis.  Patient presents to the emergency department with complaints of hypoxia and weakness and has been admitted under observation for Hyperkalemia [E87.5] Weakness [R53.1] Acute on chronic respiratory failure with hypoxia (HCC) [J96.21] Acute on chronic hypoxic respiratory failure ([place]) [J96.21]   [facility] [place]/MWF/Lt AVF   End-stage renal disease on hemodialysis.  Potassium corrected with dialysis.  Will receive dialysis tomorrow.   2. Anemia of chronic kidney disease Lab Results  Component Value Date   HGB 8.4 (L) 05/26/2022    Hemoglobin remains decreased. Low dose EPO with HD  3. Secondary Hyperparathyroidism:  Lab Results  Component Value Date   PTH 194 (H) 05/11/2017   CALCIUM 9.1 05/26/2022   CAION 1.19 03/27/2022   PHOS 3.7 05/20/2022    Calcium and phosphorus within desired range. Continue calcium acetate with meals.  4.  Hypertension with chronic kidney disease.  Patient receives amlodipine, clonidine, hydralazine, irbesartan, isosorbide, doxazosin and minoxidil outpatient.  Currently receiving these medications.Blood pressure acceptable for this patient.    LOS: [place] 12/26/20233:14 PM

## 2022-05-26 NOTE — TOC Progression Note (Addendum)
Transition of Care [name]) - Progression Note    Patient Details  Name: Todd Brown MRN: [id number] Date of Birth: Oct 05, 1960  Transition of Care [facility]) CM/SW [place], RN Phone Number: 05/26/2022, 12:21 PM  Clinical Narrative:    The patient to return to [place] home but not earlier than 12/27 due to Alger Simons with [facility] reached out and stated that they are following the patient from their PCP prior to [facility]  Expected Discharge Plan: [place] ([facility] [name] [phone]) Barriers to Discharge: Other (must enter comment) (Group Home cannot take back till Wednesday 05/27/22 due to Covid and lack of private room/patient compliance issues)  Expected Discharge Plan and Services   Discharge Planning Services: CM Consult   Living arrangements for the past 2 months: Group Home                                       Social Determinants of Health (SDOH) Interventions SDOH Screenings   Food Insecurity: No Food Insecurity (10/08/2018)  Transportation Needs: No Transportation Needs (10/08/2018)  Alcohol Screen: Low Risk  (10/05/2019)  Financial Resource Strain: Low Risk  (10/08/2018)  Physical Activity: Unknown (10/08/2018)  Social Connections: Unknown (10/08/2018)  Stress: No Stress Concern Present (10/08/2018)  Tobacco Use: Low Risk  (05/20/2022)    Readmission Risk Interventions     No data to display

## 2022-05-27 ENCOUNTER — Encounter (INDEPENDENT_AMBULATORY_CARE_PROVIDER_SITE_OTHER): Payer: Self-pay | Admitting: Nurse Practitioner

## 2022-05-27 DIAGNOSIS — J9621 Acute and chronic respiratory failure with hypoxia: Secondary | ICD-10-CM | POA: Diagnosis not present

## 2022-05-27 LAB — TYPE AND SCREEN
ABO/RH(D): O POS
Antibody Screen: NEGATIVE
Unit division: 0

## 2022-05-27 LAB — BASIC METABOLIC PANEL
Anion gap: 12 (ref 5–15)
BUN: 70 mg/dL — ABNORMAL HIGH (ref 8–23)
CO2: 26 mmol/L (ref 22–32)
Calcium: 9.4 mg/dL (ref 8.9–10.3)
Chloride: 95 mmol/L — ABNORMAL LOW (ref 98–111)
Creatinine, Ser: 8.16 mg/dL — ABNORMAL HIGH (ref 0.61–1.24)
GFR, Estimated: 7 mL/min — ABNORMAL LOW (ref >60)
Glucose, Bld: 83 mg/dL (ref 70–99)
Potassium: 5.6 mmol/L — ABNORMAL HIGH (ref 3.5–5.1)
Sodium: 133 mmol/L — ABNORMAL LOW (ref 135–145)

## 2022-05-27 LAB — CBC
HCT: 23.3 % — ABNORMAL LOW (ref 39.0–52.0)
Hemoglobin: 8 g/dL — ABNORMAL LOW (ref 13.0–17.0)
MCH: 32.9 pg (ref 26.0–34.0)
MCHC: 34.3 g/dL (ref 30.0–36.0)
MCV: 95.9 fL (ref 80.0–100.0)
Platelets: 174 10*3/uL (ref 150–400)
RBC: 2.43 MIL/uL — ABNORMAL LOW (ref 4.22–5.81)
RDW: 17.3 % — ABNORMAL HIGH (ref 11.5–15.5)
WBC: 6.1 10*3/uL (ref 4.0–10.5)
nRBC: 0 % (ref 0.0–0.2)

## 2022-05-27 LAB — BPAM RBC
Blood Product Expiration Date: 202401192359
ISSUE DATE / TIME: 202312260010
Unit Type and Rh: 5100

## 2022-05-27 LAB — MAGNESIUM: Magnesium: 1.9 mg/dL (ref 1.7–2.4)

## 2022-05-27 MED ORDER — SODIUM ZIRCONIUM CYCLOSILICATE 10 G PO PACK
10.0000 g | PACK | Freq: Once | ORAL | Status: AC
  Administered 2022-05-27: 10 g via ORAL
  Filled 2022-05-27: qty 1

## 2022-05-27 MED ORDER — EPOETIN ALFA 4000 UNIT/ML IJ SOLN
INTRAMUSCULAR | Status: AC
  Filled 2022-05-27: qty 1

## 2022-05-27 NOTE — Progress Notes (Signed)
[facility]  ROUNDING NOTE   Subjective:   Todd Brown is a 61 year old male with past medical conditions including COPD, hypertension, DVT, schizophrenia, and end-stage renal disease on hemodialysis.  Patient presents to the emergency department with complaints of hypoxia and weakness and has been admitted under observation for Hyperkalemia [E87.5] Weakness [R53.1] Acute on chronic respiratory failure with hypoxia (HCC) [J96.21] Acute on chronic hypoxic respiratory failure ([place]) [J96.21]  Patient is known to our practice and receives outpatient dialysis treatments at [facility] on a MWF schedule, supervised by Dr. Holley Raring.    Patient seen laying in bed Remains on room air Denies pain or discomfort Scheduled for dialysis later today   Objective:  Vital signs in last 24 hours:  Temp:  [98 F (36.7 C)-98.3 F (36.8 C)] 98.3 F (36.8 C) (12/27 0806) Pulse Rate:  [77-85] 85 (12/27 0806) Resp:  [16-18] 16 (12/27 0806) BP: (155-166)/(78-84) 155/78 (12/27 0806) SpO2:  [94 %-95 %] 94 % (12/27 0806)  Weight change:  Filed Weights   05/22/22 1929 05/24/22 1642 05/24/22 1943  Weight: 80.6 kg 76.9 kg 75.4 kg    Intake/Output: I/O last 3 completed shifts: In: 370 [Blood:370] Out: 0    Intake/Output this shift:  No intake/output data recorded.  Physical Exam: General: NAD  Head: Normocephalic, atraumatic. Moist oral mucosal membranes  Eyes: Anicteric  Lungs:  Diminished in bases, normal effort  Heart: Regular rate and rhythm  Abdomen:  Soft, nontender  Extremities: Trace peripheral edema.  Neurologic: Alert and oriented to self and place  Skin: No lesions  Access: Right chest PermCath    Basic Metabolic Panel: Recent Labs  Lab 05/23/22 0620 05/24/22 0520 05/25/22 0528 05/26/22 0455 05/27/22 0403  NA 135 135 135 133* 133*  K 4.1 4.5 4.5 4.8 5.6*  CL 96* 97* 97* 95* 95*  CO2 30 28 29 27 26   GLUCOSE 77 90 81 93 83  BUN 40* 55* 42* 56* 70*   CREATININE 5.43* 6.74* 5.45* 6.98* 8.16*  CALCIUM 8.5* 8.8* 8.7* 9.1 9.4  MG 1.8 2.0 1.9 1.9 1.9     Liver Function Tests: No results for input(s): "AST", "ALT", "ALKPHOS", "BILITOT", "PROT", "ALBUMIN" in the last 168 hours.  No results for input(s): "LIPASE", "AMYLASE" in the last 168 hours. No results for input(s): "AMMONIA" in the last 168 hours.  CBC: Recent Labs  Lab 05/23/22 0620 05/24/22 0520 05/25/22 0528 05/26/22 0455 05/27/22 0403  WBC 3.6* 3.7* 4.7 5.9 6.1  HGB 7.1* 7.4* 6.9* 8.4* 8.0*  HCT 21.3* 22.6* 21.0* 25.4* 23.3*  MCV 99.5 100.4* 100.5* 96.9 95.9  PLT 153 168 172 177 174     Cardiac Enzymes: No results for input(s): "CKTOTAL", "CKMB", "CKMBINDEX", "TROPONINI" in the last 168 hours.  BNP: Invalid input(s): "POCBNP"  CBG: Recent Labs  Lab 05/20/22 1857  GLUCAP 33     Microbiology: Results for orders placed or performed during the hospital encounter of 05/20/22  Resp panel by RT-PCR (RSV, Flu A&B, Covid) Anterior Nasal Swab     Status: Abnormal   Collection Time: 05/20/22 10:04 AM   Specimen: Anterior Nasal Swab  Result Value Ref Range Status   SARS Coronavirus 2 by RT PCR POSITIVE (A) NEGATIVE Final    Comment: (NOTE) SARS-CoV-2 target nucleic acids are DETECTED.  The SARS-CoV-2 RNA is generally detectable in upper respiratory specimens during the acute phase of infection. Positive results are indicative of the presence of the identified virus, but do not rule out  bacterial infection or co-infection with other pathogens not detected by the test. Clinical correlation with patient history and other diagnostic information is necessary to determine patient infection status. The expected result is Negative.  Fact Sheet for Patients: EntrepreneurPulse.com.au  Fact Sheet for Healthcare Providers: IncredibleEmployment.be  This test is not yet approved or cleared by the [place] FDA and  has been  authorized for detection and/or diagnosis of SARS-CoV-2 by FDA under an Emergency Use Authorization (EUA).  This EUA will remain in effect (meaning this test can be used) for the duration of  the COVID-19 declaration under Section 564(b)(1) of the A ct, 21 U.S.C. section 360bbb-3(b)(1), unless the authorization is terminated or revoked sooner.     Influenza A by PCR NEGATIVE NEGATIVE Final   Influenza B by PCR NEGATIVE NEGATIVE Final    Comment: (NOTE) The Xpert Xpress SARS-CoV-2/FLU/RSV plus assay is intended as an aid in the diagnosis of influenza from Nasopharyngeal swab specimens and should not be used as a sole basis for treatment. Nasal washings and aspirates are unacceptable for Xpert Xpress SARS-CoV-2/FLU/RSV testing.  Fact Sheet for Patients: EntrepreneurPulse.com.au  Fact Sheet for Healthcare Providers: IncredibleEmployment.be  This test is not yet approved or cleared by the [place] FDA and has been authorized for detection and/or diagnosis of SARS-CoV-2 by FDA under an Emergency Use Authorization (EUA). This EUA will remain in effect (meaning this test can be used) for the duration of the COVID-19 declaration under Section 564(b)(1) of the Act, 21 U.S.C. section 360bbb-3(b)(1), unless the authorization is terminated or revoked.     Resp Syncytial Virus by PCR NEGATIVE NEGATIVE Final    Comment: (NOTE) Fact Sheet for Patients: EntrepreneurPulse.com.au  Fact Sheet for Healthcare Providers: IncredibleEmployment.be  This test is not yet approved or cleared by the [place] FDA and has been authorized for detection and/or diagnosis of SARS-CoV-2 by FDA under an Emergency Use Authorization (EUA). This EUA will remain in effect (meaning this test can be used) for the duration of the COVID-19 declaration under Section 564(b)(1) of the Act, 21 U.S.C. section 360bbb-3(b)(1), unless the  authorization is terminated or revoked.  Performed at [facility], [address] [place], [place] [address]     Coagulation Studies: No results for input(s): "LABPROT", "INR" in the last 72 hours.  Urinalysis: No results for input(s): "COLORURINE", "LABSPEC", "PHURINE", "GLUCOSEU", "HGBUR", "BILIRUBINUR", "KETONESUR", "PROTEINUR", "UROBILINOGEN", "NITRITE", "LEUKOCYTESUR" in the last 72 hours.  Invalid input(s): "APPERANCEUR"    Imaging: No results found.   Medications:    sodium chloride      albuterol  2 puff Inhalation Q6H   amLODipine  10 mg Oral q morning   vitamin C  500 mg Oral Daily   calcium acetate  1,334 mg Oral TID WC   carbamazepine  1,000 mg Oral q morning   Chlorhexidine Gluconate Cloth  6 each Topical Q0600   cholecalciferol  1,000 Units Oral Daily   cloNIDine  0.2 mg Transdermal Weekly   doxazosin  4 mg Oral QHS   epoetin (EPOGEN/PROCRIT) injection  4,000 Units Intravenous Q M,W,F-HD   ferrous sulfate  325 mg Oral Daily   fluPHENAZine  2.5 mg Oral Daily   heparin  5,000 Units Subcutaneous Q8H   hydrALAZINE  100 mg Oral TID   insulin aspart  5 Units Intravenous Once   isosorbide mononitrate  60 mg Oral BID   minoxidil  2.5 mg Oral Daily   mometasone-formoterol  2 puff Inhalation BID   multivitamin  1 tablet Oral QHS   pantoprazole  40 mg Oral Daily   trihexyphenidyl  2 mg Oral BID   zinc sulfate  220 mg Oral Daily   sodium chloride, acetaminophen, guaiFENesin-dextromethorphan, ondansetron **OR** ondansetron (ZOFRAN) IV, polyethylene glycol  Assessment/ Plan:  [name] is a 61 y.o.  male  with past medical conditions including COPD, hypertension, DVT, schizophrenia, and end-stage renal disease on hemodialysis.  Patient presents to the emergency department with complaints of hypoxia and weakness and has been admitted under observation for Hyperkalemia [E87.5] Weakness [R53.1] Acute on chronic respiratory failure with hypoxia  (HCC) [J96.21] Acute on chronic hypoxic respiratory failure ([place]) [J96.21]   [facility] [place]/MWF/Lt AVF   End-stage renal disease on hemodialysis.  Potassium corrected with dialysis.  Scheduled to receive dialysis later today, UF goal 1 to 1.5 L as tolerated.  Next treatment scheduled for Friday.  2. Anemia of chronic kidney disease Lab Results  Component Value Date   HGB 8.0 (L) 05/27/2022    Hemoglobin outside of desired range.  Low dose EPO with HD  3. Secondary Hyperparathyroidism:  Lab Results  Component Value Date   PTH 194 (H) 05/11/2017   CALCIUM 9.4 05/27/2022   CAION 1.19 03/27/2022   PHOS 3.7 05/20/2022    Will continue to monitor bone minerals during next admission.  Continue calcium acetate with meals.  4.  Hypertension with chronic kidney disease.  Patient receives amlodipine, clonidine, hydralazine, irbesartan, isosorbide, doxazosin and minoxidil outpatient.  Currently receiving these medications.Blood pressure 155/78.   LOS: 6   12/27/20232:26 PM

## 2022-05-27 NOTE — Progress Notes (Signed)
Subjective:    Patient ID: Todd Brown, male    DOB: 18-Jun-1960, 61 y.o.   MRN: [id number] Chief Complaint  Patient presents with   Follow-up    [facility] 3 week follow up    The patient returns to the office for followup status post intervention of their dialysis access 03/27/22.   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  This is healing well. The patient denies amaurosis fugax or recent TIA symptoms. There are no recent neurological changes noted. There is no history of DVT, PE or superficial thrombophlebitis. No recent episodes of angina or shortness of breath documented.   Duplex ultrasound of the AV access shows a patent access.  Today the patient's flow volume is 1643 through the new Artegraft .      Review of Systems  Neurological:  Positive for weakness.  All other systems reviewed and are negative.      Objective:   Physical Exam Vitals reviewed.  HENT:     Head: Normocephalic.  Cardiovascular:     Rate and Rhythm: Normal rate.     Pulses:          Radial pulses are 2+ on the left side.  Pulmonary:     Effort: Pulmonary effort is normal.  Skin:    General: Skin is warm and dry.  Neurological:     Mental Status: He is alert and oriented to person, place, and time.  Psychiatric:        Mood and Affect: Mood normal.        Behavior: Behavior normal.        Thought Content: Thought content normal.        Judgment: Judgment normal.     BP (!) 120/59 (BP Location: Right Arm)   Pulse 74   Resp 16   Past Medical History:  Diagnosis Date   Anemia    Arthritis    Chronic respiratory failure (HCC)    COPD (chronic obstructive pulmonary disease) ([place])    Dialysis patient ([place])    Mon. -Wed.- Fri   DVT (deep venous thrombosis) ([place])    cephalic and basolic vein thrombosis   ESRD (end stage renal disease) ([place])    ESRD (end stage renal disease) on dialysis ([place])    Hyperlipidemia    Hypertension    Malignant hypertension     Pneumonia 2016   Renal artery stenosis (HCC)    Schizophrenia (HCC)     Social History   Socioeconomic History   Marital status: Single    Spouse name: Not on file   Number of children: 0   Years of education: Not on file   Highest education level: Not on file  Occupational History   Occupation: Disability   Tobacco Use   Smoking status: Never    Passive exposure: Never   Smokeless tobacco: Never  Vaping Use   Vaping Use: Never used  Substance and Sexual Activity   Alcohol use: Not Currently   Drug use: Not Currently    Types: Marijuana    Comment: not since living at assisted living place 03/2020   Sexual activity: Not Currently  Other Topics Concern   Not on file  Social History Narrative   ** Merged History Encounter **       Living at [facility] [place], [place] is legal guardian    Social Determinants of Health   Financial Resource Strain: Low Risk  (  10/08/2018)   Overall Financial Resource Strain (CARDIA)    Difficulty of Paying Living Expenses: Not hard at all  Food Insecurity: No Food Insecurity (10/08/2018)   Hunger Vital Sign    Worried About Running Out of Food in the Last Year: Never true    Ran Out of Food in the Last Year: Never true  Transportation Needs: No Transportation Needs (10/08/2018)   PRAPARE - Hydrologist (Medical): No    Lack of Transportation (Non-Medical): No  Physical Activity: Unknown (10/08/2018)   Exercise Vital Sign    Days of Exercise per Week: Patient refused    Minutes of Exercise per Session: Patient refused  Stress: No Stress Concern Present (10/08/2018)   [place]    Feeling of Stress : Not at all  Social Connections: Unknown (10/08/2018)   Social Connection and Isolation Panel [NHANES]    Frequency of Communication with Friends and Family: Patient refused    Frequency of Social Gatherings  with Friends and Family: Patient refused    Attends Religious Services: Patient refused    Active Member of Clubs or Organizations: Patient refused    Attends Archivist Meetings: Patient refused    Marital Status: Patient refused  Intimate Partner Violence: Unknown (10/08/2018)   Humiliation, Afraid, Rape, and Kick questionnaire    Fear of Current or Ex-Partner: Patient refused    Emotionally Abused: Patient refused    Physically Abused: Patient refused    Sexually Abused: Patient refused    Past Surgical History:  Procedure Laterality Date   A/V FISTULAGRAM N/A 08/06/2016   Procedure: A/V Fistulagram;  Surgeon: Algernon Huxley, MD;  Location: [place] [facility];  Service: Cardiovascular;  Laterality: N/A;   A/V FISTULAGRAM N/A 02/14/2021   Procedure: A/V FISTULAGRAM poss Perm Cath Insertion;  Surgeon: Katha Cabal, MD;  Location: [place] [facility];  Service: Cardiovascular;  Laterality: N/A;   A/V FISTULAGRAM Left 08/26/2021   Procedure: A/V Fistulagram;  Surgeon: Algernon Huxley, MD;  Location: [place] [facility];  Service: Cardiovascular;  Laterality: Left;   A/V SHUNT INTERVENTION N/A 08/06/2016   Procedure: A/V Shunt Intervention;  Surgeon: Algernon Huxley, MD;  Location: [place] [facility];  Service: Cardiovascular;  Laterality: N/A;   AV FISTULA PLACEMENT Left 12/26/2014   Procedure: ARTERIOVENOUS (AV) FISTULA CREATION;  Surgeon: Algernon Huxley, MD;  Location: [facility];  Service: Vascular;  Laterality: Left;   AV FISTULA PLACEMENT     ESOPHAGOGASTRODUODENOSCOPY (EGD) WITH PROPOFOL N/A 05/06/2017   Procedure: ESOPHAGOGASTRODUODENOSCOPY (EGD) WITH PROPOFOL;  Surgeon: Jonathon Bellows, MD;  Location: [facility] ENDOS[facility];  Service: Gastroenterology;  Laterality: N/A;   INSERTION OF DIALYSIS CATHETER Right    PERIPHERAL VASCULAR CATHETERIZATION N/A 10/22/2014   Procedure: Dialysis/Perma Catheter Insertion;  Surgeon: Algernon Huxley, MD;  Location: [place] [facility];  Service:  Cardiovascular;  Laterality: N/A;   PERIPHERAL VASCULAR CATHETERIZATION N/A 02/28/2015   Procedure: Dialysis/Perma Catheter Removal;  Surgeon: Algernon Huxley, MD;  Location: [place] [facility];  Service: Cardiovascular;  Laterality: N/A;   Repair fx left lower leg     yrs ago (age 89)   REVISON OF ARTERIOVENOUS FISTULA Left 03/27/2022   Procedure: REVISON OF ARTERIOVENOUS FISTULA (JUMP GRAFT);  Surgeon: Katha Cabal, MD;  Location: [facility] OR[facility];  Service: Vascular;  Laterality: Left;   TONSILLECTOMY      Family History  Problem Relation Age of Onset  Kidney disease Brother    Diabetes Neg Hx     Allergies  Allergen Reactions   Chlorpromazine Other (See Comments)    Reaction:  Unknown , pt states it makes him feel real bad Reaction:  Unknown , pt states it makes him feel real bad        Latest Ref Rng & Units 05/26/2022    4:55 AM 05/25/2022    5:28 AM 05/24/2022    5:20 AM  CBC  WBC 4.0 - 10.5 K/uL 5.9  4.7  3.7   Hemoglobin 13.0 - 17.0 g/dL 8.4  6.9  7.4   Hematocrit 39.0 - 52.0 % 25.4  21.0  22.6   Platelets 150 - 400 K/uL 177  172  168       CMP     Component Value Date/Time   NA 133 (L) 05/26/2022 0455   K 4.8 05/26/2022 0455   CL 95 (L) 05/26/2022 0455   CO2 27 05/26/2022 0455   GLUCOSE 93 05/26/2022 0455   BUN 56 (H) 05/26/2022 0455   CREATININE 6.98 (H) 05/26/2022 0455   CREATININE 3.47 (H) 03/23/2013 1501   CALCIUM 9.1 05/26/2022 0455   CALCIUM 8.1 (L) 10/22/2014 0504   PROT 7.7 05/20/2022 1007   ALBUMIN 3.9 05/20/2022 1007   AST 41 05/20/2022 1007   ALT 22 05/20/2022 1007   ALKPHOS 103 05/20/2022 1007   BILITOT 1.2 05/20/2022 1007   GFRNONAA 8 (L) 05/26/2022 0455   GFRAA 7 (L) 10/23/2019 0405     No results found.     Assessment & Plan:   1. ESRD on dialysis [facility]) Patient's wound is healing without issue.  However there is a small hematoma noted in the antecubital fossa.  Will have him return in 3 months with noninvasive studies.   Otherwise patient can begin to utilize the dialysis access.   No current facility-administered medications on file prior to visit.   Current Outpatient Medications on File Prior to Visit  Medication Sig Dispense Refill   acetaminophen (TYLENOL) 500 MG tablet Take 500-1,000 mg by mouth every 6 (six) hours as needed for mild pain or moderate pain.     albuterol (VENTOLIN HFA) 108 (90 Base) MCG/ACT inhaler Inhale 1-2 puffs into the lungs every 4 (four) hours as needed for wheezing or shortness of breath.     amLODipine (NORVASC) 10 MG tablet Take 10 mg by mouth every morning.     b complex-vitamin c-folic acid (NEPHRO-VITE) 0.8 MG TABS tablet Take 1 tablet by mouth at bedtime.     calcium acetate (PHOSLO) 667 MG capsule Take 1,334 mg by mouth 3 (three) times daily with meals.     carbamazepine (TEGRETOL) 200 MG tablet Take 1,000 mg by mouth every morning.     cholecalciferol (VITAMIN D) 1000 UNITS tablet Take 1,000 Units by mouth daily.     cloNIDine (CATAPRES - DOSED IN MG/24 HR) 0.2 mg/24hr patch Place 1 patch (0.2 mg total) onto the skin once a week. 4 patch 0   doxazosin (CARDURA) 4 MG tablet Take 1 tablet (4 mg total) by mouth at bedtime. 30 tablet 0   ferrous sulfate 325 (65 FE) MG tablet Take 1 tablet (325 mg total) by mouth daily. 30 tablet 3   fluPHENAZine (PROLIXIN) 2.5 MG tablet Take 2.5 mg by mouth daily.     Fluticasone-Salmeterol (ADVAIR) 250-50 MCG/DOSE AEPB Inhale 1 puff into the lungs 2 (two) times daily. Rinse mouth after use discard 30 days after opening  hydrALAZINE (APRESOLINE) 100 MG tablet Take 1 tablet (100 mg total) by mouth 3 (three) times daily.     HYDROcodone-acetaminophen (NORCO) 5-325 MG tablet Take 1-2 tablets by mouth every 6 (six) hours as needed for moderate pain or severe pain. (Patient not taking: Reported on 05/20/2022) 40 tablet 0   irbesartan (AVAPRO) 300 MG tablet Take 1 tablet (300 mg total) by mouth daily. 30 tablet 0   isosorbide mononitrate (IMDUR)  60 MG 24 hr tablet Take 60 mg by mouth 2 (two) times daily.     minoxidil (LONITEN) 2.5 MG tablet Take 1 tablet (2.5 mg total) by mouth daily. 30 tablet 0   omeprazole (PRILOSEC) 40 MG capsule Take 40 mg by mouth every morning.     OXYGEN Inhale into the lungs. 2 liters continuous     polyethylene glycol (MIRALAX / GLYCOLAX) 17 g packet Take 17 g by mouth daily as needed for mild constipation.     trihexyphenidyl (ARTANE) 2 MG tablet Take 2 mg by mouth 2 (two) times daily.     cloNIDine (CATAPRES) 0.3 MG tablet Take 0.3 mg by mouth 3 (three) times daily. (Patient not taking: Reported on 03/27/2022)     losartan (COZAAR) 100 MG tablet Take 1 tablet (100 mg total) by mouth daily. (Patient not taking: Reported on 03/27/2022) 30 tablet 0    There are no Patient Instructions on file for this visit. No follow-ups on file.   Kris Hartmann, NP

## 2022-05-27 NOTE — Progress Notes (Signed)
Blood pump error upon starting treatment, reduced pt bfr to 350.

## 2022-05-27 NOTE — Progress Notes (Signed)
PT did 3hrs of HD, tolerated well with no issues  UF = 1400 ml    05/27/22 1902  Vitals  Temp 98.8 F (37.1 C)  Temp Source Oral  BP (!) 147/80  MAP (mmHg) 101  BP Location Left Arm  BP Method Automatic  Patient Position (if appropriate) Lying  Pulse Rate 74  Pulse Rate Source Monitor  ECG Heart Rate 74  Resp 12  Oxygen Therapy  SpO2 95 %  O2 Device Nasal Cannula  O2 Flow Rate (L/min) 2 L/min  Patient Activity (if Appropriate) In bed  Pulse Oximetry Type Continuous  Post Treatment  Dialyzer Clearance Lightly streaked  Duration of HD Treatment -hour(s) 3 hour(s)  Hemodialysis Intake (mL) 0 mL  Liters Processed 60.7  Fluid Removed (mL) 1400 mL  Tolerated HD Treatment Yes  Post-Hemodialysis Comments No issues  Hemodialysis Catheter Right Subclavian  Placement Date/Time: (c) 12/17/21 0730   Placed prior to admission: Yes  Orientation: Right  Access Location: Subclavian  Site Condition No complications  Blue Lumen Status Heparin locked;Dead end cap in place  Red Lumen Status Heparin locked;Dead end cap in place  Purple Lumen Status N/A  Catheter fill solution Heparin 1000 units/ml  Catheter fill volume (Arterial) 1.9 cc  Catheter fill volume (Venous) 1.9  Dressing Type Transparent  Dressing Status Antimicrobial disc in place  Interventions Dressing changed  Drainage Description None  Dressing Change Due 06/02/21  Post treatment catheter status Capped and Clamped

## 2022-05-27 NOTE — Progress Notes (Signed)
  Progress Note   Patient: Todd Brown GEX:[id number] DOB: 1960/07/16 DOA: 05/20/2022     6 DOS: the patient was seen and examined on 05/27/2022   Brief hospital course: In summary, HOVANES HYMAS is a 61 y.o. 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  Patient was said to be very weak and unable to get out of bed .Noted to  be hypoxic on admission with sats of 74% on his 2 L/min nasal cannula.  Hypoxia was attributed to be secondary to COPD exacerbation in context of COVID 19 infection and fluid overload from missing hemodialysis.  Assessment and Plan:  Acute on chronic hypoxemic respiratory failure secondary to COPD exacerbation.   Hypoxia appears to be multifactorial and related to volume overload from end-stage renal disease as well as from COVID-19 viral infection.  Clinically improved and at baseline oxygen requirements.   COVID-19 virus infection: Completed course of remdesivir and Decadron.    Hyperkalemia: Recurrent Secondary to end-stage renal disease in the setting of ARB use D/c'ed irbesartan.  Patient is scheduled for hemodialysis today.  Follow-up Chem-7 in a.m.    ESRD on dialysis [facility]) MWF Hemodialysis maintenance.  Nephrology recommendation.   Anemia of chronic kidney disease Patient has anemia due to chronic kidney disease but is noted to have a significant drop Patient completed IV iron x 3 doses Status post 1u pRBC for Hgb 6.9 (consent obtained from DSS)   Schizophrenia ([place]) Continue Artane and fluphenazine   HTN: on amlodipine, clonidine patch, hydralazine, Imdur, doxazosin and minoxidil  Subjective: Patient was seen and assessed in the dialysis unit.  He is unable to give any appropriate history.  No events reported overnight.  Physical  Exam: Vitals:   05/27/22 1551 05/27/22 1554 05/27/22 1555 05/27/22 1630  BP:   118/69 117/70  Pulse:   68 67  Resp:   16 13  Temp:      TempSrc:      SpO2:   94% 95%  Weight: 79.2 kg 79.2 kg    Height:       Patient was seen and evaluated by me.  General looks well.  Still, with no appropriate conversation.  Not in any acute distress.  Head is atraumatic.  Neck supple with no jugular venous distention.  Left upper extremity AV fistula noted.  Abdomen soft nontender.  Extremities without pedal edema.  Cranial nerves could not be examined due to patient's noncooperation.  He is alert but not oriented to time or place. Data Reviewed: Send sodium 133, potassium 5.6, chloride 95, BUN 70, creatinine 8.16.  Hemoglobin 8.0, WBC 6.1, hematocrit 23.3, platelet count is 174 Family Communication: No family available.  Discussed plan of care with nursing staff. Disposition: Status is: Inpatient Remains inpatient appropriate because: Ongoing treatment for COVID and hyperkalemia.  Planned Discharge Destination: Barriers to discharge: Recurrent hyperkalemia, COVID-19 infection  Time spent: 35 minutes  Author: Artist Beach, MD 05/27/2022 4:53 PM  For on call review [email or web address].

## 2022-05-28 DIAGNOSIS — N186 End stage renal disease: Secondary | ICD-10-CM | POA: Diagnosis not present

## 2022-05-28 DIAGNOSIS — J9621 Acute and chronic respiratory failure with hypoxia: Secondary | ICD-10-CM | POA: Diagnosis not present

## 2022-05-28 DIAGNOSIS — E875 Hyperkalemia: Secondary | ICD-10-CM | POA: Diagnosis not present

## 2022-05-28 DIAGNOSIS — R0902 Hypoxemia: Secondary | ICD-10-CM | POA: Diagnosis not present

## 2022-05-28 LAB — BASIC METABOLIC PANEL
Anion gap: 13 (ref 5–15)
BUN: 48 mg/dL — ABNORMAL HIGH (ref 8–23)
CO2: 25 mmol/L (ref 22–32)
Calcium: 9.3 mg/dL (ref 8.9–10.3)
Chloride: 92 mmol/L — ABNORMAL LOW (ref 98–111)
Creatinine, Ser: 6.51 mg/dL — ABNORMAL HIGH (ref 0.61–1.24)
GFR, Estimated: 9 mL/min — ABNORMAL LOW (ref >60)
Glucose, Bld: 147 mg/dL — ABNORMAL HIGH (ref 70–99)
Potassium: 4.6 mmol/L (ref 3.5–5.1)
Sodium: 130 mmol/L — ABNORMAL LOW (ref 135–145)

## 2022-05-28 MED ORDER — GUAIFENESIN-DM 100-10 MG/5ML PO SYRP: 10.0000 mL | ORAL_SOLUTION | ORAL | 0 refills | Status: DC | PRN

## 2022-05-28 MED ORDER — ASCORBIC ACID 500 MG PO TABS: 500.0000 mg | ORAL_TABLET | Freq: Every day | ORAL | Status: DC

## 2022-05-28 MED ORDER — ZINC SULFATE 220 (50 ZN) MG PO CAPS: 220.0000 mg | ORAL_CAPSULE | Freq: Every day | ORAL | Status: DC

## 2022-05-28 NOTE — Progress Notes (Signed)
Attempted to reach out to legal guardian. Attempt x2 not successful. Left voice mail.

## 2022-05-28 NOTE — Progress Notes (Signed)
Contacted and reported to [place]. Pt transported to group home ALF via EMS. IV intact when removed. Pt discharged with dialysis cath and all personal belongings.

## 2022-05-28 NOTE — TOC Progression Note (Signed)
Transition of Care [facility]) - Progression Note    Patient Details  Name: Todd Brown MRN: [id number] Date of Birth: 05-15-1961  Transition of Care [facility]) CM/SW [place], RN Phone Number: 05/28/2022, 10:00 AM  Clinical Narrative:   Damaris Schooner with [name] at [facility], He is expecting to have the patient come back today, the patient will need EMS transport, he has DME at the group home and also has Oxygen there.      Expected Discharge Plan: Group Home ([facility] [name] [phone]) Barriers to Discharge: Other (must enter comment) (Group Home cannot take back till Wednesday 05/27/22 due to Covid and lack of private room/patient compliance issues)  Expected Discharge Plan and Services   Discharge Planning Services: CM Consult   Living arrangements for the past 2 months: Group Home                                       Social Determinants of Health (SDOH) Interventions SDOH Screenings   Food Insecurity: No Food Insecurity (10/08/2018)  Transportation Needs: No Transportation Needs (10/08/2018)  Alcohol Screen: Low Risk  (10/05/2019)  Financial Resource Strain: Low Risk  (10/08/2018)  Physical Activity: Unknown (10/08/2018)  Social Connections: Unknown (10/08/2018)  Stress: No Stress Concern Present (10/08/2018)  Tobacco Use: Low Risk  (05/27/2022)    Readmission Risk Interventions     No data to display

## 2022-05-28 NOTE — Discharge Summary (Signed)
Physician Discharge Summary   Patient: Todd Brown MRN: [id number] DOB: 07-27-1960  Admit date:     05/20/2022  Discharge date: 05/28/22  Discharge Physician: Todd Brown   PCP: Todd Agee, NP   Recommendations at discharge:  Please follow up with PCP in one week.   Discharge Diagnoses: Principal Problem:   Acute on chronic respiratory failure with hypoxia (HCC) Active Problems:   Hyperkalemia   Hypoxia   ESRD on dialysis ([place])   Schizophrenia ([place])   Anemia due to chronic kidney disease   COVID-19 virus infection    Hospital Course:   Todd Brown is a 61 y.o. 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  Patient was said to be very weak and unable to get out of bed .Noted to  be hypoxic on admission with sats of 74% on his 2 L/min nasal cannula.  Hypoxia was attributed to be secondary to COPD exacerbation in context of COVID 19 infection and fluid overload from missing hemodialysis.    Assessment and Plan:  Acute on chronic hypoxemic respiratory failure secondary to COPD exacerbation.   Hypoxia appears to be multifactorial and related to volume overload from end-stage renal disease as well as from COVID-19 viral infection.  Clinically improved and at baseline oxygen requirements.   COVID-19 virus infection: Completed course of remdesivir and Decadron.    Hyperkalemia: Recurrent Secondary to end-stage renal disease in the setting of ARB use D/c'ed irbesartan.  Patient is scheduled for hemodialysis today.  repeat levels wnl.    ESRD on dialysis [facility]) MWF Hemodialysis maintenance.  Nephrology recommendation.   Anemia of chronic kidney disease Patient has anemia due to chronic kidney disease but is noted to have a significant drop Patient  completed IV iron x 3 doses Status post 1u pRBC for Hgb 6.9 (consent obtained from DSS)   Schizophrenia ([place]) Continue Artane and fluphenazine  HTN: on amlodipine, clonidine patch, hydralazine, Imdur, doxazosin and minoxidil         Consultants: nephrology. Procedures performed: none.   Disposition: group home.  Diet recommendation:  Discharge Diet Orders (From admission, onward)     Start     Ordered   05/28/22 0000  Diet - low sodium heart healthy        05/28/22 1154          Dysphagia 3 diet.  DISCHARGE MEDICATION: Allergies as of 05/28/2022       Reactions   Chlorpromazine Other (See Comments)   Reaction:  Unknown , pt states it makes him feel real bad Reaction:  Unknown , pt states it makes him feel real bad        Medication List     STOP taking these medications    cloNIDine 0.3 MG tablet Commonly known as: CATAPRES   HYDROcodone-acetaminophen 5-325 MG tablet Commonly known as: Norco   irbesartan 300 MG tablet Commonly known as: AVAPRO   losartan 100 MG tablet Commonly known as: COZAAR       TAKE these medications    acetaminophen 500 MG tablet Commonly known as: TYLENOL Take 500-1,000 mg by mouth every 6 (six) hours as needed for mild pain or moderate pain.   albuterol 108 (90 Base) MCG/ACT inhaler Commonly known as: VENTOLIN HFA Inhale 1-2 puffs into the lungs every 4 (four)  hours as needed for wheezing or shortness of breath.   amLODipine 10 MG tablet Commonly known as: NORVASC Take 10 mg by mouth every morning.   ascorbic acid 500 MG tablet Commonly known as: VITAMIN C Take 1 tablet (500 mg total) by mouth daily. Start taking on: May 29, 2022   b complex-vitamin c-folic acid 0.8 MG Tabs tablet Take 1 tablet by mouth at bedtime.   calcium acetate 667 MG capsule Commonly known as: PHOSLO Take 1,334 mg by mouth 3 (three) times daily with meals.   carbamazepine 200 MG tablet Commonly known as: TEGRETOL Take 1,000 mg by  mouth every morning.   cholecalciferol 1000 units tablet Commonly known as: VITAMIN D Take 1,000 Units by mouth daily.   cloNIDine 0.2 mg/24hr patch Commonly known as: CATAPRES - Dosed in mg/24 hr Place 1 patch (0.2 mg total) onto the skin once a week.   doxazosin 4 MG tablet Commonly known as: CARDURA Take 1 tablet (4 mg total) by mouth at bedtime.   ferrous sulfate 325 (65 FE) MG tablet Take 1 tablet (325 mg total) by mouth daily.   fluPHENAZine 2.5 MG tablet Commonly known as: PROLIXIN Take 2.5 mg by mouth daily.   Fluticasone-Salmeterol 250-50 MCG/DOSE Aepb Commonly known as: ADVAIR Inhale 1 puff into the lungs 2 (two) times daily. Rinse mouth after use discard 30 days after opening   guaiFENesin-dextromethorphan 100-10 MG/5ML syrup Commonly known as: ROBITUSSIN DM Take 10 mLs by mouth every 4 (four) hours as needed for cough.   hydrALAZINE 100 MG tablet Commonly known as: APRESOLINE Take 1 tablet (100 mg total) by mouth 3 (three) times daily.   isosorbide mononitrate 60 MG 24 hr tablet Commonly known as: IMDUR Take 60 mg by mouth 2 (two) times daily.   minoxidil 2.5 MG tablet Commonly known as: LONITEN Take 1 tablet (2.5 mg total) by mouth daily.   omeprazole 40 MG capsule Commonly known as: PRILOSEC Take 40 mg by mouth every morning.   OXYGEN Inhale into the lungs. 2 liters continuous   polyethylene glycol 17 g packet Commonly known as: MIRALAX / GLYCOLAX Take 17 g by mouth daily as needed for mild constipation.   promethazine-dextromethorphan 6.25-15 MG/5ML syrup Commonly known as: PROMETHAZINE-DM Take 5 mLs by mouth every 6 (six) hours as needed.   trihexyphenidyl 2 MG tablet Commonly known as: ARTANE Take 2 mg by mouth 2 (two) times daily.   zinc sulfate 220 (50 Zn) MG capsule Take 1 capsule (220 mg total) by mouth daily. Start taking on: May 29, 2022        Follow-up Information     Tackett, Reche Dixon, NP. Schedule an appointment as  soon as possible for a visit in 1 week(s).   Specialty: Family Medicine Contact information: [place] [address] [phone]                Discharge Exam: Danley Danker Weights   05/24/22 1943 05/27/22 1551 05/27/22 1554  Weight: 75.4 kg 79.2 kg 79.2 kg   General exam: Appears calm and comfortable  Respiratory system: Clear to auscultation. Respiratory effort normal. Cardiovascular system: S1 & S2 heard, RRR. No JVD, murmurs, Gastrointestinal system: Abdomen is nondistended, soft and nontender.  Central nervous system: Alert and oriented to person Extremities:  no edema.  Skin: No rashes, Psychiatry:  Mood & affect appropriate.    Condition at discharge: fair  The results of significant diagnostics from this hospitalization (including imaging, microbiology, ancillary and laboratory) are  listed below for reference.   Imaging Studies: CT CHEST WO CONTRAST  Result Date: 05/20/2022 CLINICAL DATA:  COVID pneumonia short of breath EXAM: CT CHEST WITHOUT CONTRAST TECHNIQUE: Multidetector CT imaging of the chest was performed following the standard protocol without IV contrast. RADIATION DOSE REDUCTION: This exam was performed according to the departmental dose-optimization program which includes automated exposure control, adjustment of the mA and/or kV according to patient size and/or use of iterative reconstruction technique. COMPARISON:  Chest x-ray 05/20/2022 FINDINGS: Cardiovascular: Limited evaluation without intravenous contrast. Mild aortic atherosclerosis. No aneurysm. Dilated pulmonary trunk up to 4.3 cm. Coronary vascular calcification. Cardiomegaly. Small moderate circumferential pericardial effusion measuring up to 14 mm maximal thickness. Vascular catheter tip at the cavoatrial junction. Mediastinum/Nodes: Midline trachea. No suspicious thyroid nodule. Mild mediastinal adenopathy. Left paratracheal node measures 10 mm. Right paratracheal node measures 11 mm.  Esophagus within normal limits. Lungs/Pleura: Small bilateral pleural effusions. Partial consolidation in left lower lobe, atelectasis versus pneumonia. 13 mm superior left lower lobe nodule or nodular consolidation, series 4, image 83. Somewhat irregular and nodular consolidative focus in the medial right lower lobe measuring 2.3 by 2 cm, series 4, image 78. Upper Abdomen: No acute finding. Musculoskeletal: Possible healing fracture mid sternum but degraded by motion. IMPRESSION: 1. Cardiomegaly with small to moderate circumferential pericardial effusion. Small bilateral pleural effusions. Partial consolidation in the left lower lobe, atelectasis versus pneumonia. 2. 13 mm superior left lower lobe nodule or nodular consolidation. Somewhat irregular and nodular consolidative focus in the medial right lower lobe measuring up to 2.3 cm. Findings could be infectious, inflammatory, or neoplastic in etiology. Short interval CT follow-up recommended to assess for resolution or persistence. 3. Mild mediastinal adenopathy, possibly reactive. 4. Dilated pulmonary trunk suggesting pulmonary arterial hypertension. 5. Aortic atherosclerosis. Aortic Atherosclerosis (ICD10-I70.0). Electronically Signed   By: Donavan Foil M.D.   On: 05/20/2022 17:29   CT Head Wo Contrast  Result Date: 05/20/2022 CLINICAL DATA:  Altered mental status, increasing weakness EXAM: CT HEAD WITHOUT CONTRAST TECHNIQUE: Contiguous axial images were obtained from the base of the skull through the vertex without intravenous contrast. RADIATION DOSE REDUCTION: This exam was performed according to the departmental dose-optimization program which includes automated exposure control, adjustment of the mA and/or kV according to patient size and/or use of iterative reconstruction technique. COMPARISON:  12/15/2021 FINDINGS: Brain: No evidence of acute infarction, hemorrhage, hydrocephalus, extra-axial collection or mass lesion/mass effect. Mild  periventricular white matter hypodensity. Vascular: No hyperdense vessel or unexpected calcification. Skull: Normal. Negative for fracture or focal lesion. Sinuses/Orbits: No acute finding. 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 Other: None. IMPRESSION: 1. No acute intracranial pathology. Small-vessel white matter disease. 2. 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 Electronically Signed   By: Delanna Ahmadi M.D.   On: 05/20/2022 10:49   DG Chest Portable 1 View  Result Date: 05/20/2022 CLINICAL DATA:  Shortness of breath. EXAM: PORTABLE CHEST 1 VIEW COMPARISON:  Chest x-ray dated February 10, 2022. FINDINGS: Unchanged tunneled right internal jugular dialysis catheter. Unchanged cardiomegaly. Mildly worsened pulmonary vascular congestion and diffuse interstitial thickening. No focal consolidation, pleural effusion, or pneumothorax. No acute osseous abnormality. IMPRESSION: 1. Mildly worsened interstitial pulmonary edema. Electronically Signed   By: Titus Dubin M.D.   On: 05/20/2022 10:40   VAS US DUPLEX DIALYSIS ACCESS (AVF,AVG)  Result Date: 05/18/2022 DIALYSIS ACCESS Patient Name:  DONTAVION NOXON  Date of Exam:   05/14/2022 Medical Rec #:  [id number]         Accession #:    [id number] Date of Birth: 04-21-1961         Patient Gender: M Patient Age:   61 years Exam Location:  [facility] Procedure:      VAS US DUPLEX DIALYSIS ACCESS (AVF, AVG) Referring Phys: Leotis Pain --------------------------------------------------------------------------------  Reason for Exam: Routine follow up. Access Type: Brachial-cephalic AVF. 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 Unilateral testing is considered an integral part of a complete examination. Limited examinations for reoccurring indications may be performed as noted.  Findings: +--------------------+----------+-----------------+--------+ AVF                 PSV (cm/s)Flow Vol (mL/min)Comments +--------------------+----------+-----------------+--------+ Native artery inflow   122          1643                +--------------------+----------+-----------------+--------+ AVF Anastomosis        333                              +--------------------+----------+-----------------+--------+  +---------------+----------+-------------+----------+--------+ OUTFLOW VEIN   PSV (cm/s)Diameter (cm)Depth (cm)Describe +---------------+----------+-------------+----------+--------+ Subclavian vein   121                                    +---------------+----------+-------------+----------+--------+ Confluence        191                                    +---------------+----------+-------------+----------+--------+ Clavicle          208                           tortuous +---------------+----------+-------------+----------+--------+ Prox UA           213                                    +---------------+----------+-------------+----------+--------+ Mid UA            176                                    +---------------+----------+-------------+----------+--------+ Dist UA           173                            graft   +---------------+----------+-------------+----------+--------+  Mostly retrograde, bidirectional flow in the left distal radial artery. 2.9 x 2.4 x 1.8cm heterogeneous collection around the left anticubital fossa level graft with no extravascular flow noted.  Summary: Patent left brachial-cephalic AVF with no significant stenosis and normal Flow Volume. Heterogeneous collection noted which  appears to occupy the space of the resected large aneurysmal outflow vein.  *See table(s) above for measurements and observations.  Diagnosing physician: Leotis Pain MD  Electronically signed by Leotis Pain MD on 05/18/2022 at 8:55:35 AM.  --------------------------------------------------------------------------------   Final     Microbiology: Results for orders placed or performed during the hospital encounter of 05/20/22  Resp panel by RT-PCR (RSV, Flu A&B, Covid) Anterior Nasal Swab     Status: Abnormal   Collection Time: 05/20/22 10:04 AM   Specimen: Anterior Nasal Swab  Result Value Ref Range Status   SARS Coronavirus 2 by RT PCR POSITIVE (A) NEGATIVE Final    Comment: (NOTE) SARS-CoV-2 target nucleic acids are DETECTED.  The SARS-CoV-2 RNA is generally detectable in upper respiratory specimens during the acute phase of infection. Positive results are indicative of the presence of the identified virus, but do not rule out bacterial infection or co-infection with other pathogens not detected by the test. Clinical correlation with patient history and other diagnostic information is necessary to determine patient infection status. The expected result is Negative.  Fact Sheet for Patients: EntrepreneurPulse.com.au  Fact Sheet for Healthcare Providers: IncredibleEmployment.be  This test is not yet approved or cleared by the [place] FDA and  has been authorized for detection and/or diagnosis of SARS-CoV-2 by FDA under an Emergency Use Authorization (EUA).  This EUA will remain in effect (meaning this test can be used) for the duration of  the COVID-19 declaration under Section 564(b)(1) of the A ct, 21 U.S.C. section 360bbb-3(b)(1), unless the authorization is terminated or revoked sooner.     Influenza A by PCR NEGATIVE NEGATIVE Final   Influenza B by PCR NEGATIVE NEGATIVE Final    Comment: (NOTE) The Xpert Xpress SARS-CoV-2/FLU/RSV plus  assay is intended as an aid in the diagnosis of influenza from Nasopharyngeal swab specimens and should not be used as a sole basis for treatment. Nasal washings and aspirates are unacceptable for Xpert Xpress SARS-CoV-2/FLU/RSV testing.  Fact Sheet for Patients: EntrepreneurPulse.com.au  Fact Sheet for Healthcare Providers: IncredibleEmployment.be  This test is not yet approved or cleared by the [place] FDA and has been authorized for detection and/or diagnosis of SARS-CoV-2 by FDA under an Emergency Use Authorization (EUA). This EUA will remain in effect (meaning this test can be used) for the duration of the COVID-19 declaration under Section 564(b)(1) of the Act, 21 U.S.C. section 360bbb-3(b)(1), unless the authorization is terminated or revoked.     Resp Syncytial Virus by PCR NEGATIVE NEGATIVE Final    Comment: (NOTE) Fact Sheet for Patients: EntrepreneurPulse.com.au  Fact Sheet for Healthcare Providers: IncredibleEmployment.be  This test is not yet approved or cleared by the [place] FDA and has been authorized for detection and/or diagnosis of SARS-CoV-2 by FDA under an Emergency Use Authorization (EUA). This EUA will remain in effect (meaning this test can be used) for the duration of the COVID-19 declaration under Section 564(b)(1) of the Act, 21 U.S.C. section 360bbb-3(b)(1), unless the authorization is terminated or revoked.  Performed at [facility], [address] [place], [place] [address]     Labs: CBC: Recent Labs  Lab 05/23/22 [phone] 05/24/22 0520 05/25/22 0528 05/26/22 0455 05/27/22 0403  WBC 3.6* 3.7* 4.7 5.9 6.1  HGB 7.1* 7.4* 6.9* 8.4* 8.0*  HCT 21.3* 22.6* 21.0* 25.4* 23.3*  MCV 99.5 100.4* 100.5* 96.9 95.9  PLT 153 168 172 177 315   Basic Metabolic Panel: Recent Labs  Lab 05/23/22 0620 05/24/22 0520 05/25/22 0528 05/26/22 0455 05/27/22 0403  05/28/22 1034  NA 135 135 135 133* 133* 130*  K 4.1 4.5 4.5 4.8 5.6* 4.6  CL 96*  97* 97* 95* 95* 92*  CO2 30 28 29 27 26 25   GLUCOSE 77 90 81 93 83 147*  BUN 40* 55* 42* 56* 70* 48*  CREATININE 5.43* 6.74* 5.45* 6.98* 8.16* 6.51*  CALCIUM 8.5* 8.8* 8.7* 9.1 9.4 9.3  MG 1.8 2.0 1.9 1.9 1.9  --    Liver Function Tests: No results for input(s): "AST", "ALT", "ALKPHOS", "BILITOT", "PROT", "ALBUMIN" in the last 168 hours. CBG: No results for input(s): "GLUCAP" in the last 168 hours.  Discharge time spent: 42 minutes.   Signed: Hosie Poisson, MD [facility] 05/28/2022

## 2022-05-28 NOTE — TOC Progression Note (Signed)
Transition of Care [facility]) - Progression Note    Patient Details  Name: Todd Brown MRN: [id number] Date of Birth: May 19, 1961  Transition of Care [facility]) CM/SW [place], RN Phone Number: 05/28/2022, 12:03 PM  Clinical Narrative:   The patient is to DC back to [place], I called Candice Goble with DSS, and left a VM letting her know about the DC and sent a secure email as well, EMS will transport, They have been called    Expected Discharge Plan: [place] ([facility] [name] [phone]) Barriers to Discharge: Other (must enter comment) (Group Home cannot take back till Wednesday 05/27/22 due to Covid and lack of private room/patient compliance issues)  Expected Discharge Plan and Services   Discharge Planning Services: CM Consult   Living arrangements for the past 2 months: Group Home Expected Discharge Date: 05/28/22                                     Social Determinants of Health (SDOH) Interventions SDOH Screenings   Food Insecurity: No Food Insecurity (10/08/2018)  Transportation Needs: No Transportation Needs (10/08/2018)  Alcohol Screen: Low Risk  (10/05/2019)  Financial Resource Strain: Low Risk  (10/08/2018)  Physical Activity: Unknown (10/08/2018)  Social Connections: Unknown (10/08/2018)  Stress: No Stress Concern Present (10/08/2018)  Tobacco Use: Low Risk  (05/27/2022)    Readmission Risk Interventions     No data to display

## 2022-05-28 NOTE — Progress Notes (Signed)
[facility]  ROUNDING NOTE   Subjective:   Todd Brown is a 61 year old male with past medical conditions including COPD, hypertension, DVT, schizophrenia, and end-stage renal disease on hemodialysis.  Patient presents to the emergency department with complaints of hypoxia and weakness and has been admitted under observation for Hyperkalemia [E87.5] Weakness [R53.1] Acute on chronic respiratory failure with hypoxia (HCC) [J96.21] Acute on chronic hypoxic respiratory failure ([place]) [J96.21]  Patient is known to our practice and receives outpatient dialysis treatments at [facility] on a MWF schedule, supervised by Dr. Holley Raring.    Patient sitting up in bed Alert and oriented Tolerated dialysis well yesterday Room air   Objective:  Vital signs in last 24 hours:  Temp:  [97.4 F (36.3 C)-98.8 F (37.1 C)] 97.4 F (36.3 C) (12/28 0900) Pulse Rate:  [67-78] 69 (12/28 0900) Resp:  [12-24] 15 (12/28 0900) BP: (117-158)/(66-97) 149/69 (12/28 0900) SpO2:  [92 %-100 %] 95 % (12/28 0900) Weight:  [79.2 kg] 79.2 kg (12/27 1554)  Weight change:  Filed Weights   05/24/22 1943 05/27/22 1551 05/27/22 1554  Weight: 75.4 kg 79.2 kg 79.2 kg    Intake/Output: I/O last 3 completed shifts: In: 542 [P.O.:591] Out: 1400 [Other:1400]   Intake/Output this shift:  No intake/output data recorded.  Physical Exam: General: NAD  Head: Normocephalic, atraumatic. Moist oral mucosal membranes  Eyes: Anicteric  Lungs:  Diminished in bases, normal effort  Heart: Regular rate and rhythm  Abdomen:  Soft, nontender  Extremities: Trace peripheral edema.  Neurologic: Alert and oriented to self and place  Skin: No lesions  Access: Right chest PermCath    Basic Metabolic Panel: Recent Labs  Lab 05/23/22 0620 05/24/22 0520 05/25/22 0528 05/26/22 0455 05/27/22 0403 05/28/22 1034  NA 135 135 135 133* 133* 130*  K 4.1 4.5 4.5 4.8 5.6* 4.6  CL 96* 97* 97* 95* 95* 92*  CO2  30 28 29 27 26 25   GLUCOSE 77 90 81 93 83 147*  BUN 40* 55* 42* 56* 70* 48*  CREATININE 5.43* 6.74* 5.45* 6.98* 8.16* 6.51*  CALCIUM 8.5* 8.8* 8.7* 9.1 9.4 9.3  MG 1.8 2.0 1.9 1.9 1.9  --      Liver Function Tests: No results for input(s): "AST", "ALT", "ALKPHOS", "BILITOT", "PROT", "ALBUMIN" in the last 168 hours.  No results for input(s): "LIPASE", "AMYLASE" in the last 168 hours. No results for input(s): "AMMONIA" in the last 168 hours.  CBC: Recent Labs  Lab 05/23/22 0620 05/24/22 0520 05/25/22 0528 05/26/22 0455 05/27/22 0403  WBC 3.6* 3.7* 4.7 5.9 6.1  HGB 7.1* 7.4* 6.9* 8.4* 8.0*  HCT 21.3* 22.6* 21.0* 25.4* 23.3*  MCV 99.5 100.4* 100.5* 96.9 95.9  PLT 153 168 172 177 174     Cardiac Enzymes: No results for input(s): "CKTOTAL", "CKMB", "CKMBINDEX", "TROPONINI" in the last 168 hours.  BNP: Invalid input(s): "POCBNP"  CBG: No results for input(s): "GLUCAP" in the last 168 hours.   Microbiology: Results for orders placed or performed during the hospital encounter of 05/20/22  Resp panel by RT-PCR (RSV, Flu A&B, Covid) Anterior Nasal Swab     Status: Abnormal   Collection Time: 05/20/22 10:04 AM   Specimen: Anterior Nasal Swab  Result Value Ref Range Status   SARS Coronavirus 2 by RT PCR POSITIVE (A) NEGATIVE Final    Comment: (NOTE) SARS-CoV-2 target nucleic acids are DETECTED.  The SARS-CoV-2 RNA is generally detectable in upper respiratory specimens during the acute phase of infection.  Positive results are indicative of the presence of the identified virus, but do not rule out bacterial infection or co-infection with other pathogens not detected by the test. Clinical correlation with patient history and other diagnostic information is necessary to determine patient infection status. The expected result is Negative.  Fact Sheet for Patients: EntrepreneurPulse.com.au  Fact Sheet for Healthcare  Providers: IncredibleEmployment.be  This test is not yet approved or cleared by the [place] FDA and  has been authorized for detection and/or diagnosis of SARS-CoV-2 by FDA under an Emergency Use Authorization (EUA).  This EUA will remain in effect (meaning this test can be used) for the duration of  the COVID-19 declaration under Section 564(b)(1) of the A ct, 21 U.S.C. section 360bbb-3(b)(1), unless the authorization is terminated or revoked sooner.     Influenza A by PCR NEGATIVE NEGATIVE Final   Influenza B by PCR NEGATIVE NEGATIVE Final    Comment: (NOTE) The Xpert Xpress SARS-CoV-2/FLU/RSV plus assay is intended as an aid in the diagnosis of influenza from Nasopharyngeal swab specimens and should not be used as a sole basis for treatment. Nasal washings and aspirates are unacceptable for Xpert Xpress SARS-CoV-2/FLU/RSV testing.  Fact Sheet for Patients: EntrepreneurPulse.com.au  Fact Sheet for Healthcare Providers: IncredibleEmployment.be  This test is not yet approved or cleared by the [place] FDA and has been authorized for detection and/or diagnosis of SARS-CoV-2 by FDA under an Emergency Use Authorization (EUA). This EUA will remain in effect (meaning this test can be used) for the duration of the COVID-19 declaration under Section 564(b)(1) of the Act, 21 U.S.C. section 360bbb-3(b)(1), unless the authorization is terminated or revoked.     Resp Syncytial Virus by PCR NEGATIVE NEGATIVE Final    Comment: (NOTE) Fact Sheet for Patients: EntrepreneurPulse.com.au  Fact Sheet for Healthcare Providers: IncredibleEmployment.be  This test is not yet approved or cleared by the [place] FDA and has been authorized for detection and/or diagnosis of SARS-CoV-2 by FDA under an Emergency Use Authorization (EUA). This EUA will remain in effect (meaning this test can be  used) for the duration of the COVID-19 declaration under Section 564(b)(1) of the Act, 21 U.S.C. section 360bbb-3(b)(1), unless the authorization is terminated or revoked.  Performed at [facility], [address] [place], [place] [address]     Coagulation Studies: No results for input(s): "LABPROT", "INR" in the last 72 hours.  Urinalysis: No results for input(s): "COLORURINE", "LABSPEC", "PHURINE", "GLUCOSEU", "HGBUR", "BILIRUBINUR", "KETONESUR", "PROTEINUR", "UROBILINOGEN", "NITRITE", "LEUKOCYTESUR" in the last 72 hours.  Invalid input(s): "APPERANCEUR"    Imaging: No results found.   Medications:    sodium chloride      albuterol  2 puff Inhalation Q6H   amLODipine  10 mg Oral q morning   vitamin C  500 mg Oral Daily   calcium acetate  1,334 mg Oral TID WC   carbamazepine  1,000 mg Oral q morning   Chlorhexidine Gluconate Cloth  6 each Topical Q0600   cholecalciferol  1,000 Units Oral Daily   cloNIDine  0.2 mg Transdermal Weekly   doxazosin  4 mg Oral QHS   epoetin (EPOGEN/PROCRIT) injection  4,000 Units Intravenous Q M,W,F-HD   ferrous sulfate  325 mg Oral Daily   fluPHENAZine  2.5 mg Oral Daily   heparin  5,000 Units Subcutaneous Q8H   hydrALAZINE  100 mg Oral TID   insulin aspart  5 Units Intravenous Once   isosorbide mononitrate  60 mg Oral BID   minoxidil  2.5 mg Oral Daily   mometasone-formoterol  2 puff Inhalation BID   multivitamin  1 tablet Oral QHS   pantoprazole  40 mg Oral Daily   trihexyphenidyl  2 mg Oral BID   zinc sulfate  220 mg Oral Daily   sodium chloride, acetaminophen, guaiFENesin-dextromethorphan, ondansetron **OR** ondansetron (ZOFRAN) IV, polyethylene glycol  Assessment/ Plan:  Todd Brown is a 61 y.o.  male  with past medical conditions including COPD, hypertension, DVT, schizophrenia, and end-stage renal disease on hemodialysis.  Patient presents to the emergency department with complaints of hypoxia and weakness  and has been admitted under observation for Hyperkalemia [E87.5] Weakness [R53.1] Acute on chronic respiratory failure with hypoxia (HCC) [J96.21] Acute on chronic hypoxic respiratory failure ([place]) [J96.21]   [facility] [place]/MWF/Lt AVF   End-stage renal disease on hemodialysis.  Potassium corrected with dialysis.  Dialysis received yesterday, UF 1.4 L achieved.  Next treatment scheduled for Friday.  2. Anemia of chronic kidney disease Lab Results  Component Value Date   HGB 8.0 (L) 05/27/2022    Hemoglobin slowly decreasing without signs of bleeding.  Continue low dose EPO with HD  3. Secondary Hyperparathyroidism:  Lab Results  Component Value Date   PTH 194 (H) 05/11/2017   CALCIUM 9.3 05/28/2022   CAION 1.19 03/27/2022   PHOS 3.7 05/20/2022    Continue calcium acetate with meals.  4.  Hypertension with chronic kidney disease.  Patient receives amlodipine, clonidine, hydralazine, irbesartan, isosorbide, doxazosin and minoxidil outpatient.  Currently receiving these medications.  Blood pressure remains acceptable for this patient.   LOS: [place] 12/28/202312:15 PM

## 2022-06-19 ENCOUNTER — Inpatient Hospital Stay
Admission: EM | Admit: 2022-06-19 | Discharge: 2022-06-30 | DRG: 280 | Disposition: A | Discharge status: Skilled Nursing Facility | Payer: Medicaid Other | Source: Skilled Nursing Facility | Attending: Internal Medicine | Admitting: Internal Medicine

## 2022-06-19 ENCOUNTER — Emergency Department: Payer: Medicaid Other

## 2022-06-19 ENCOUNTER — Other Ambulatory Visit: Payer: Self-pay

## 2022-06-19 ENCOUNTER — Inpatient Hospital Stay: Payer: Medicaid Other

## 2022-06-19 DIAGNOSIS — Z888 Allergy status to other drugs, medicaments and biological substances status: Secondary | ICD-10-CM

## 2022-06-19 DIAGNOSIS — E785 Hyperlipidemia, unspecified: Secondary | ICD-10-CM | POA: Diagnosis present

## 2022-06-19 DIAGNOSIS — Z1152 Encounter for screening for COVID-19: Secondary | ICD-10-CM | POA: Diagnosis not present

## 2022-06-19 DIAGNOSIS — Z79899 Other long term (current) drug therapy: Secondary | ICD-10-CM

## 2022-06-19 DIAGNOSIS — N186 End stage renal disease: Secondary | ICD-10-CM | POA: Diagnosis present

## 2022-06-19 DIAGNOSIS — G9340 Encephalopathy, unspecified: Secondary | ICD-10-CM | POA: Diagnosis not present

## 2022-06-19 DIAGNOSIS — Z9981 Dependence on supplemental oxygen: Secondary | ICD-10-CM | POA: Diagnosis not present

## 2022-06-19 DIAGNOSIS — I214 Non-ST elevation (NSTEMI) myocardial infarction: Secondary | ICD-10-CM

## 2022-06-19 DIAGNOSIS — T80211A Bloodstream infection due to central venous catheter, initial encounter: Principal | ICD-10-CM | POA: Diagnosis present

## 2022-06-19 DIAGNOSIS — B954 Other streptococcus as the cause of diseases classified elsewhere: Secondary | ICD-10-CM | POA: Diagnosis present

## 2022-06-19 DIAGNOSIS — Z841 Family history of disorders of kidney and ureter: Secondary | ICD-10-CM | POA: Diagnosis not present

## 2022-06-19 DIAGNOSIS — J9611 Chronic respiratory failure with hypoxia: Secondary | ICD-10-CM | POA: Diagnosis present

## 2022-06-19 DIAGNOSIS — G9341 Metabolic encephalopathy: Secondary | ICD-10-CM | POA: Diagnosis present

## 2022-06-19 DIAGNOSIS — F209 Schizophrenia, unspecified: Secondary | ICD-10-CM | POA: Diagnosis present

## 2022-06-19 DIAGNOSIS — J441 Chronic obstructive pulmonary disease with (acute) exacerbation: Secondary | ICD-10-CM | POA: Diagnosis present

## 2022-06-19 DIAGNOSIS — Z86718 Personal history of other venous thrombosis and embolism: Secondary | ICD-10-CM | POA: Diagnosis not present

## 2022-06-19 DIAGNOSIS — R6510 Systemic inflammatory response syndrome (SIRS) of non-infectious origin without acute organ dysfunction: Secondary | ICD-10-CM | POA: Diagnosis not present

## 2022-06-19 DIAGNOSIS — I10 Essential (primary) hypertension: Secondary | ICD-10-CM | POA: Diagnosis present

## 2022-06-19 DIAGNOSIS — I21A1 Myocardial infarction type 2: Secondary | ICD-10-CM | POA: Diagnosis present

## 2022-06-19 DIAGNOSIS — I120 Hypertensive chronic kidney disease with stage 5 chronic kidney disease or end stage renal disease: Secondary | ICD-10-CM | POA: Diagnosis present

## 2022-06-19 DIAGNOSIS — Z992 Dependence on renal dialysis: Secondary | ICD-10-CM | POA: Diagnosis not present

## 2022-06-19 DIAGNOSIS — N2581 Secondary hyperparathyroidism of renal origin: Secondary | ICD-10-CM | POA: Diagnosis present

## 2022-06-19 DIAGNOSIS — Z79890 Hormone replacement therapy (postmenopausal): Secondary | ICD-10-CM | POA: Diagnosis not present

## 2022-06-19 DIAGNOSIS — R0602 Shortness of breath: Secondary | ICD-10-CM | POA: Diagnosis present

## 2022-06-19 DIAGNOSIS — E875 Hyperkalemia: Secondary | ICD-10-CM | POA: Diagnosis not present

## 2022-06-19 DIAGNOSIS — R6520 Severe sepsis without septic shock: Secondary | ICD-10-CM | POA: Diagnosis present

## 2022-06-19 DIAGNOSIS — A408 Other streptococcal sepsis: Secondary | ICD-10-CM | POA: Diagnosis present

## 2022-06-19 DIAGNOSIS — R4182 Altered mental status, unspecified: Principal | ICD-10-CM

## 2022-06-19 DIAGNOSIS — A419 Sepsis, unspecified organism: Secondary | ICD-10-CM

## 2022-06-19 DIAGNOSIS — J810 Acute pulmonary edema: Secondary | ICD-10-CM | POA: Diagnosis present

## 2022-06-19 DIAGNOSIS — R7881 Bacteremia: Secondary | ICD-10-CM | POA: Diagnosis present

## 2022-06-19 DIAGNOSIS — D631 Anemia in chronic kidney disease: Secondary | ICD-10-CM | POA: Diagnosis present

## 2022-06-19 DIAGNOSIS — J449 Chronic obstructive pulmonary disease, unspecified: Secondary | ICD-10-CM | POA: Diagnosis not present

## 2022-06-19 LAB — COMPREHENSIVE METABOLIC PANEL
ALT: 25 U/L (ref 0–44)
AST: 37 U/L (ref 15–41)
Albumin: 4 g/dL (ref 3.5–5.0)
Alkaline Phosphatase: 98 U/L (ref 38–126)
Anion gap: 15 (ref 5–15)
BUN: 48 mg/dL — ABNORMAL HIGH (ref 8–23)
CO2: 26 mmol/L (ref 22–32)
Calcium: 9.7 mg/dL (ref 8.9–10.3)
Chloride: 94 mmol/L — ABNORMAL LOW (ref 98–111)
Creatinine, Ser: 6.5 mg/dL — ABNORMAL HIGH (ref 0.61–1.24)
GFR, Estimated: 9 mL/min — ABNORMAL LOW (ref >60)
Glucose, Bld: 81 mg/dL (ref 70–99)
Potassium: 4.6 mmol/L (ref 3.5–5.1)
Sodium: 135 mmol/L (ref 135–145)
Total Bilirubin: 1 mg/dL (ref 0.3–1.2)
Total Protein: 7.3 g/dL (ref 6.5–8.1)

## 2022-06-19 LAB — BLOOD CULTURE ID PANEL (REFLEXED) - BCID2

## 2022-06-19 LAB — TROPONIN I (HIGH SENSITIVITY)
Troponin I (High Sensitivity): 155 ng/L (ref <18)
Troponin I (High Sensitivity): 193 ng/L (ref <18)
Troponin I (High Sensitivity): 194 ng/L (ref <18)

## 2022-06-19 LAB — CBC WITH DIFFERENTIAL/PLATELET
Abs Immature Granulocytes: 0.16 10*3/uL — ABNORMAL HIGH (ref 0.00–0.07)
Basophils Absolute: 0 10*3/uL (ref 0.0–0.1)
Basophils Relative: 0 %
Eosinophils Absolute: 0 10*3/uL (ref 0.0–0.5)
Eosinophils Relative: 0 %
HCT: 30.5 % — ABNORMAL LOW (ref 39.0–52.0)
Hemoglobin: 9.8 g/dL — ABNORMAL LOW (ref 13.0–17.0)
Immature Granulocytes: 1 %
Lymphocytes Relative: 1 %
Lymphs Abs: 0.1 10*3/uL — ABNORMAL LOW (ref 0.7–4.0)
MCH: 34.3 pg — ABNORMAL HIGH (ref 26.0–34.0)
MCHC: 32.1 g/dL (ref 30.0–36.0)
MCV: 106.6 fL — ABNORMAL HIGH (ref 80.0–100.0)
Monocytes Absolute: 1.2 10*3/uL — ABNORMAL HIGH (ref 0.1–1.0)
Monocytes Relative: 8 %
Neutro Abs: 12.8 10*3/uL — ABNORMAL HIGH (ref 1.7–7.7)
Neutrophils Relative %: 90 %
Platelets: 163 10*3/uL (ref 150–400)
RBC: 2.86 MIL/uL — ABNORMAL LOW (ref 4.22–5.81)
RDW: 17.7 % — ABNORMAL HIGH (ref 11.5–15.5)
WBC: 14.3 10*3/uL — ABNORMAL HIGH (ref 4.0–10.5)
nRBC: 0.1 % (ref 0.0–0.2)

## 2022-06-19 LAB — BLOOD GAS, VENOUS
Acid-Base Excess: 3 mmol/L — ABNORMAL HIGH (ref 0.0–2.0)
Bicarbonate: 29.3 mmol/L — ABNORMAL HIGH (ref 20.0–28.0)
O2 Saturation: 41 %
Patient temperature: 37
pCO2, Ven: 53 mmHg (ref 44–60)
pH, Ven: 7.35 (ref 7.25–7.43)
pO2, Ven: 31 mmHg — CL (ref 32–45)

## 2022-06-19 LAB — HEPATITIS B SURFACE ANTIGEN: Hepatitis B Surface Ag: NONREACTIVE

## 2022-06-19 LAB — PROTIME-INR
INR: 1.2 (ref 0.8–1.2)
Prothrombin Time: 14.6 seconds (ref 11.4–15.2)

## 2022-06-19 LAB — RESP PANEL BY RT-PCR (RSV, FLU A&B, COVID)  RVPGX2
Influenza A by PCR: NEGATIVE
Influenza B by PCR: NEGATIVE
Resp Syncytial Virus by PCR: NEGATIVE
SARS Coronavirus 2 by RT PCR: NEGATIVE

## 2022-06-19 LAB — LACTIC ACID, PLASMA
Lactic Acid, Venous: 2.9 mmol/L (ref 0.5–1.9)
Lactic Acid, Venous: 1.5 mmol/L (ref 0.5–1.9)

## 2022-06-19 LAB — APTT: aPTT: 34 seconds (ref 24–36)

## 2022-06-19 LAB — TSH: TSH: >50.5 u[IU]/mL — ABNORMAL HIGH (ref 0.350–4.500)

## 2022-06-19 MED ORDER — SODIUM CHLORIDE 0.9% FLUSH
10.0000 mL | Freq: Two times a day (BID) | INTRAVENOUS | Status: DC
  Administered 2022-06-20 – 2022-06-30 (×11): 10 mL

## 2022-06-19 MED ORDER — HEPARIN SODIUM (PORCINE) 5000 UNIT/ML IJ SOLN
5000.0000 [IU] | Freq: Three times a day (TID) | INTRAMUSCULAR | Status: DC
  Administered 2022-06-19 – 2022-06-30 (×32): 5000 [IU] via SUBCUTANEOUS
  Filled 2022-06-19 (×31): qty 1

## 2022-06-19 MED ORDER — SODIUM CHLORIDE 0.9 % IV SOLN
2.0000 g | Freq: Once | INTRAVENOUS | Status: AC
  Administered 2022-06-19: 2 g via INTRAVENOUS
  Filled 2022-06-19: qty 12.5

## 2022-06-19 MED ORDER — SODIUM CHLORIDE 0.9% FLUSH: 10.0000 mL | INTRAVENOUS | Status: DC | PRN

## 2022-06-19 MED ORDER — ACETAMINOPHEN 650 MG RE SUPP: 650.0000 mg | Freq: Four times a day (QID) | RECTAL | Status: DC | PRN

## 2022-06-19 MED ORDER — POLYETHYLENE GLYCOL 3350 17 G PO PACK: 17.0000 g | PACK | Freq: Every day | ORAL | Status: DC | PRN

## 2022-06-19 MED ORDER — SODIUM CHLORIDE 0.9 % IV SOLN
500.0000 mg | Freq: Once | INTRAVENOUS | Status: AC
  Administered 2022-06-19: 500 mg via INTRAVENOUS
  Filled 2022-06-19: qty 5

## 2022-06-19 MED ORDER — IPRATROPIUM-ALBUTEROL 0.5-2.5 (3) MG/3ML IN SOLN
3.0000 mL | Freq: Four times a day (QID) | RESPIRATORY_TRACT | Status: DC
  Administered 2022-06-19 – 2022-06-20 (×3): 3 mL via RESPIRATORY_TRACT
  Filled 2022-06-19 (×3): qty 3

## 2022-06-19 MED ORDER — VANCOMYCIN HCL 750 MG/150ML IV SOLN
750.0000 mg | Freq: Once | INTRAVENOUS | Status: AC
  Administered 2022-06-19: 750 mg via INTRAVENOUS
  Filled 2022-06-19: qty 150

## 2022-06-19 MED ORDER — ALTEPLASE 2 MG IJ SOLR: 2.0000 mg | Freq: Once | INTRAMUSCULAR | Status: DC | PRN

## 2022-06-19 MED ORDER — PENTAFLUOROPROP-TETRAFLUOROETH EX AERO: 1.0000 | INHALATION_SPRAY | CUTANEOUS | Status: DC | PRN

## 2022-06-19 MED ORDER — SODIUM CHLORIDE 0.9 % IV BOLUS
250.0000 mL | Freq: Once | INTRAVENOUS | Status: AC
  Administered 2022-06-19: 250 mL via INTRAVENOUS

## 2022-06-19 MED ORDER — SODIUM CHLORIDE 0.9% FLUSH
3.0000 mL | Freq: Two times a day (BID) | INTRAVENOUS | Status: DC
  Administered 2022-06-19 – 2022-06-30 (×16): 3 mL via INTRAVENOUS

## 2022-06-19 MED ORDER — LIDOCAINE-PRILOCAINE 2.5-2.5 % EX CREA: 1.0000 | TOPICAL_CREAM | CUTANEOUS | Status: DC | PRN

## 2022-06-19 MED ORDER — LIDOCAINE HCL (PF) 1 % IJ SOLN: 5.0000 mL | INTRAMUSCULAR | Status: DC | PRN

## 2022-06-19 MED ORDER — SODIUM CHLORIDE 0.9 % IV SOLN: 1.0000 g | INTRAVENOUS | Status: DC

## 2022-06-19 MED ORDER — VANCOMYCIN HCL 750 MG/150ML IV SOLN: 750.0000 mg | Freq: Once | INTRAVENOUS | Status: DC

## 2022-06-19 MED ORDER — IPRATROPIUM-ALBUTEROL 0.5-2.5 (3) MG/3ML IN SOLN
3.0000 mL | Freq: Once | RESPIRATORY_TRACT | Status: AC
  Administered 2022-06-19: 3 mL via RESPIRATORY_TRACT
  Filled 2022-06-19: qty 3

## 2022-06-19 MED ORDER — SODIUM CHLORIDE 0.9 % IV SOLN
2.0000 g | INTRAVENOUS | Status: DC
  Administered 2022-06-20 – 2022-06-23 (×4): 2 g via INTRAVENOUS
  Filled 2022-06-19: qty 2
  Filled 2022-06-19 (×2): qty 20
  Filled 2022-06-19: qty 2

## 2022-06-19 MED ORDER — VANCOMYCIN HCL IN DEXTROSE 1-5 GM/200ML-% IV SOLN
1000.0000 mg | Freq: Once | INTRAVENOUS | Status: AC
  Administered 2022-06-19: 1000 mg via INTRAVENOUS
  Filled 2022-06-19: qty 200

## 2022-06-19 MED ORDER — ACETAMINOPHEN 325 MG PO TABS
650.0000 mg | ORAL_TABLET | Freq: Once | ORAL | Status: AC
  Administered 2022-06-19: 650 mg via ORAL
  Filled 2022-06-19: qty 2

## 2022-06-19 MED ORDER — CHLORHEXIDINE GLUCONATE CLOTH 2 % EX PADS
6.0000 | MEDICATED_PAD | Freq: Every day | CUTANEOUS | Status: DC
  Administered 2022-06-20 – 2022-06-30 (×11): 6 via TOPICAL

## 2022-06-19 MED ORDER — ACETAMINOPHEN 325 MG PO TABS
650.0000 mg | ORAL_TABLET | Freq: Four times a day (QID) | ORAL | Status: DC | PRN
  Administered 2022-06-20: 650 mg via ORAL
  Filled 2022-06-19: qty 2

## 2022-06-19 MED ORDER — ANTICOAGULANT SODIUM CITRATE 4% (200MG/5ML) IV SOLN: 5.0000 mL | Status: DC | PRN

## 2022-06-19 MED ORDER — HEPARIN SODIUM (PORCINE) 1000 UNIT/ML DIALYSIS
1000.0000 [IU] | INTRAMUSCULAR | Status: DC | PRN
  Administered 2022-06-20: 1000 [IU]
  Filled 2022-06-19: qty 1

## 2022-06-19 NOTE — Assessment & Plan Note (Addendum)
Elevated troponin at 155 with upward trend to 193 on admission.  EKG with junctional rhythm, but no other ST or T wave changes to suggest ischemia.  Patient is not endorsing any chest pain.  No prior history of CAD with normal stress test in 2018.  This may be all secondary to demand in the setting of SIRS, however patient is at risk for ACS.  - Cardiology consultation - Hold on heparin infusion pending cardiology input - Echocardiogram ordered

## 2022-06-19 NOTE — Assessment & Plan Note (Signed)
-  Nephrology consulted; appreciate their recommendations - Plan for dialysis today, per nephrology

## 2022-06-19 NOTE — Assessment & Plan Note (Addendum)
Likely multifactorial in the setting of acute illness.  Patient is protecting his airway at this time.  No hypercarbia on VBG.  Folate and B12 within normal limits recently  - Delirium precautions - TSH - Swallow evaluation

## 2022-06-19 NOTE — Assessment & Plan Note (Signed)
No wheezing on examination although patient's work of breathing does seem increased.  - DuoNebs every 6 hours - Restart home bronchodilators when able - Continue home supplemental oxygen

## 2022-06-19 NOTE — Progress Notes (Signed)
[facility]  ROUNDING NOTE   Subjective:   Todd Brown is a 62 year old male with past medical conditions including COPD, hypertension, DVT, schizophrenia, and end-stage renal disease on hemodialysis.  Patient presents to the emergency department with somnolence. Patient has been admitted for ESRD on dialysis ([place]) [N18.6, Z99.2] Acute encephalopathy [G93.40] Altered mental status, unspecified altered mental status type [R41.82] Sepsis with encephalopathy without septic shock, due to unspecified organism ([place]) [A41.9, R65.20, G93.41]  Patient is known to our practice and receives outpatient dialysis treatments at [facility] on a MWF schedule, supervised by Dr. Holley Raring.  Last treatment was completed on Wednesday. Patient remains somnolent. According to chart review, patient was given his morning meds and when checked on later, it was difficult to arouse the patient. Patient on 2L nasal cannula, No lower extremity edema.   Labs on ED arrival include BUN 48 and creatinine 6.5 with GFR 9. Troponin 155. Lactic acid 2.9 with Hgb 9.8. Respiratory panel negative. CT head shows no acute findings. Chest xray shows some pulmonary edema.   We have been consulted to manage dialysis needs.   Objective:  Vital signs in last 24 hours:  Temp:  [99.5 F (37.5 C)-100.1 F (37.8 C)] 99.9 F (37.7 C) (01/19 1358) Pulse Rate:  [91-110] 91 (01/19 1358) Resp:  [11-23] 16 (01/19 1358) BP: (120-136)/(75-94) 134/81 (01/19 1358) SpO2:  [90 %-100 %] 91 % (01/19 1358)  Weight change:  There were no vitals filed for this visit.  Intake/Output: No intake/output data recorded.   Intake/Output this shift:  No intake/output data recorded.  Physical Exam: General: Ill appearing  Head: Normocephalic, atraumatic. Moist oral mucosal membranes  Eyes: Anicteric  Lungs:  Clear to auscultation  Heart: Regular rate and rhythm  Abdomen:  Soft, tender, mild distension  Extremities:  No  peripheral edema.  Neurologic: Nonfocal, moving all four extremities  Skin: No lesions  Access: Rt Permcath    Basic Metabolic Panel: Recent Labs  Lab 06/19/22 1039  NA 135  K 4.6  CL 94*  CO2 26  GLUCOSE 81  BUN 48*  CREATININE 6.50*  CALCIUM 9.7    Liver Function Tests: Recent Labs  Lab 06/19/22 1039  AST 37  ALT 25  ALKPHOS 98  BILITOT 1.0  PROT 7.3  ALBUMIN 4.0   No results for input(s): "LIPASE", "AMYLASE" in the last 168 hours. No results for input(s): "AMMONIA" in the last 168 hours.  CBC: Recent Labs  Lab 06/19/22 1039  WBC 14.3*  NEUTROABS 12.8*  HGB 9.8*  HCT 30.5*  MCV 106.6*  PLT 163    Cardiac Enzymes: No results for input(s): "CKTOTAL", "CKMB", "CKMBINDEX", "TROPONINI" in the last 168 hours.  BNP: Invalid input(s): "POCBNP"  CBG: No results for input(s): "GLUCAP" in the last 168 hours.  Microbiology: Results for orders placed or performed during the hospital encounter of 06/19/22  Resp panel by RT-PCR (RSV, Flu A&B, Covid) Anterior Nasal Swab     Status: None   Collection Time: 06/19/22 10:31 AM   Specimen: Anterior Nasal Swab  Result Value Ref Range Status   SARS Coronavirus 2 by RT PCR NEGATIVE NEGATIVE Final    Comment: (NOTE) SARS-CoV-2 target nucleic acids are NOT DETECTED.  The SARS-CoV-2 RNA is generally detectable in upper respiratory specimens during the acute phase of infection. The lowest concentration of SARS-CoV-2 viral copies this assay can detect is 138 copies/mL. A negative result does not preclude SARS-Cov-2 infection and should not be used as the  sole basis for treatment or other patient management decisions. A negative result may occur with  improper specimen collection/handling, submission of specimen other than nasopharyngeal swab, presence of viral mutation(s) within the areas targeted by this assay, and inadequate number of viral copies(<138 copies/mL). A negative result must be combined with clinical  observations, patient history, and epidemiological information. The expected result is Negative.  Fact Sheet for Patients:  EntrepreneurPulse.com.au  Fact Sheet for Healthcare Providers:  IncredibleEmployment.be  This test is no t yet approved or cleared by the [place] FDA and  has been authorized for detection and/or diagnosis of SARS-CoV-2 by FDA under an Emergency Use Authorization (EUA). This EUA will remain  in effect (meaning this test can be used) for the duration of the COVID-19 declaration under Section 564(b)(1) of the Act, 21 U.S.C.section 360bbb-3(b)(1), unless the authorization is terminated  or revoked sooner.       Influenza A by PCR NEGATIVE NEGATIVE Final   Influenza B by PCR NEGATIVE NEGATIVE Final    Comment: (NOTE) The Xpert Xpress SARS-CoV-2/FLU/RSV plus assay is intended as an aid in the diagnosis of influenza from Nasopharyngeal swab specimens and should not be used as a sole basis for treatment. Nasal washings and aspirates are unacceptable for Xpert Xpress SARS-CoV-2/FLU/RSV testing.  Fact Sheet for Patients: EntrepreneurPulse.com.au  Fact Sheet for Healthcare Providers: IncredibleEmployment.be  This test is not yet approved or cleared by the [place] FDA and has been authorized for detection and/or diagnosis of SARS-CoV-2 by FDA under an Emergency Use Authorization (EUA). This EUA will remain in effect (meaning this test can be used) for the duration of the COVID-19 declaration under Section 564(b)(1) of the Act, 21 U.S.C. section 360bbb-3(b)(1), unless the authorization is terminated or revoked.     Resp Syncytial Virus by PCR NEGATIVE NEGATIVE Final    Comment: (NOTE) Fact Sheet for Patients: EntrepreneurPulse.com.au  Fact Sheet for Healthcare Providers: IncredibleEmployment.be  This test is not yet approved or cleared by  the [place] FDA and has been authorized for detection and/or diagnosis of SARS-CoV-2 by FDA under an Emergency Use Authorization (EUA). This EUA will remain in effect (meaning this test can be used) for the duration of the COVID-19 declaration under Section 564(b)(1) of the Act, 21 U.S.C. section 360bbb-3(b)(1), unless the authorization is terminated or revoked.  Performed at [facility], [address] [place], [place] [address]     Coagulation Studies: Recent Labs    06/19/22 1039  LABPROT 14.6  INR 1.2    Urinalysis: No results for input(s): "COLORURINE", "LABSPEC", "PHURINE", "GLUCOSEU", "HGBUR", "BILIRUBINUR", "KETONESUR", "PROTEINUR", "UROBILINOGEN", "NITRITE", "LEUKOCYTESUR" in the last 72 hours.  Invalid input(s): "APPERANCEUR"    Imaging: DG Chest Port 1 View  Result Date: 06/19/2022 CLINICAL DATA:  Questionable sepsis.  Shortness of breath. EXAM: PORTABLE CHEST 1 VIEW COMPARISON:  05/20/2022 FINDINGS: Right chest wall dialysis catheter noted with tips at the superior cavoatrial junction. Heart size is enlarged. Blunting of the costophrenic angles. Moderate diffuse increase interstitial markings are identified bilaterally. Platelike atelectasis noted in the left base. The visualized osseous structures are unremarkable. IMPRESSION: 1. Pulmonary edema and small pleural effusions. 2. Left base atelectasis. 3. Right chest wall dialysis catheter tip at the superior cavoatrial junction. Electronically Signed   By: Kerby Moors M.D.   On: 06/19/2022 10:43   CT HEAD WO CONTRAST  Result Date: 06/19/2022 CLINICAL DATA:  Altered mental status. EXAM: CT HEAD WITHOUT CONTRAST TECHNIQUE: Contiguous axial images were obtained from the  base of the skull through the vertex without intravenous contrast. RADIATION DOSE REDUCTION: This exam was performed according to the departmental dose-optimization program which includes automated exposure control, adjustment of the mA  and/or kV according to patient size and/or use of iterative reconstruction technique. COMPARISON:  05/20/2022 FINDINGS: Brain: No evidence of acute infarction, hemorrhage, hydrocephalus, extra-axial collection or mass lesion/mass effect. There is mild diffuse low-attenuation within the subcortical and periventricular white matter compatible with chronic microvascular disease. Vascular: No hyperdense vessel or unexpected calcification. Skull: Normal. Negative for fracture or focal lesion. Sinuses/Orbits: Similar appearance of left posterior dislocated lens. The paranasal sinuses are clear. Bilateral mastoid air cell effusions. Other: None IMPRESSION: 1. No acute intracranial abnormalities. 2. Mild white matter, small vessel ischemic change. 3. Bilateral mastoid air cell effusions. 4. 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 Electronically Signed   By: Kerby Moors M.D.   On: 06/19/2022 10:35     Medications:    anticoagulant sodium citrate     vancomycin 1,000 mg (06/19/22 1329)    [START ON 06/20/2022] Chlorhexidine Gluconate Cloth  6 each Topical Q0600   heparin  5,000 Units Subcutaneous Q8H   ipratropium-albuterol  3 mL Nebulization Q6H   sodium chloride flush  3 mL Intravenous Q12H   acetaminophen **OR** acetaminophen, alteplase, anticoagulant sodium citrate, heparin, lidocaine (PF), lidocaine-prilocaine, pentafluoroprop-tetrafluoroeth, polyethylene glycol  Assessment/ Plan:  [name] is a 62 y.o.  male ith past medical conditions including COPD, hypertension, DVT, schizophrenia, and end-stage renal disease on hemodialysis.  Patient presents to the emergency department with somnolence. Patient has been admitted for ESRD on dialysis ([place]) [N18.6, Z99.2] Acute encephalopathy [G93.40] Altered mental status, unspecified altered mental status type [R41.82] Sepsis with encephalopathy without septic shock, due to unspecified organism ([place]) [A41.9, R65.20,  G93.41]  [facility] [place]/MWF/Rt Permcath  End stage renal disease on hemodialysis. Last treatment received on Wednesday. Will provide treatment on Saturday with low UF.   2. Anemia of chronic kidney disease Lab Results  Component Value Date   HGB 9.8 (L) 06/19/2022   Hgb acceptable at this time, will monitor  3. Secondary Hyperparathyroidism: with outpatient labs: PTH 459, phosphorus 3.4, calcium 9.7 on 06/08/22.   Lab Results  Component Value Date   PTH 194 (H) 05/11/2017   CALCIUM 9.7 06/19/2022   CAION 1.19 03/27/2022   PHOS 3.7 05/20/2022    Prescribed calcium acetate and cholecalciferol outpatient.   4.  Hypertension with chronic kidney disease.  Home regimen includes amlodipine, clonidine, hydralazine, irbesartan, isosorbide, doxazosin and minoxidil. All currently held. Blood pressure acceptable, 134/81.    LOS: 0   1/19/20242:14 PM

## 2022-06-19 NOTE — Consult Note (Signed)
Pharmacy Antibiotic Note  Todd Brown is a 62 y.o. male admitted on 06/19/2022 with  shortness of breath and altered mental status .  Pharmacy has been consulted for Vancomyin and Cefepime dosing.   Patient has PMH significant of ESRD on HD MWF outpatient. Spoke with nephrologist, and plan is to have next dialysis session on 1/20.  Plan: Patient received Vancomycin 1g IV in the ED. Will order additional 750mg  dose to complete loading dose of 1750mg .  - Will order Vancomycin 750mg  qHD session currently, with first dose to be given on 1/20 towards the end of the session(will f/u with nephrology to confirm inpatient dialysis plan going forward)  Cefepime 1g Q24 hours  Weight: 79.2 kg (174 lb 9.7 oz)  Temp (24hrs), Avg:99.8 F (37.7 C), Min:99.5 F (37.5 C), Max:100.1 F (37.8 C)  Recent Labs  Lab 06/19/22 1039 06/19/22 1240  WBC 14.3*  --   CREATININE 6.50*  --   LATICACIDVEN 2.9* 1.5    Estimated Creatinine Clearance: 12.7 mL/min (A) (by C-G formula based on SCr of 6.5 mg/dL (H)).    Allergies  Allergen Reactions   Chlorpromazine Other (See Comments)    Reaction:  Unknown , pt states it makes him feel real bad Reaction:  Unknown , pt states it makes him feel real bad     Antimicrobials this admission: Vanc 1/19 >>  Cefepime 1/19 >>   Dose adjustments this admission: N/A  Microbiology results: 1/19 BCx: pending 1/19 Resp cx: pending  Thank you for allowing pharmacy to be a part of this patient's care.  Todd Brown 06/19/2022 3:18 PM

## 2022-06-19 NOTE — Discharge Planning (Signed)
ESTABLISHED HEMODIALYSIS: [facility]  [address]  [place], [place] [address] [phone]  Scheduled Days: Monday Wednesday and Friday    Treatment Time: 11:30am  Above schedule confirmed with Amber AA

## 2022-06-19 NOTE — ED Notes (Addendum)
IV team at bedside was not successful at being able to start a second IV after multiple attempts MD Quentin Cornwall aware. However, they were successful at obtaining bloodwork for a second troponin and lacic.

## 2022-06-19 NOTE — Sepsis Progress Note (Signed)
eLink is following this Code Sepsis.

## 2022-06-19 NOTE — Consult Note (Signed)
CODE SEPSIS - PHARMACY COMMUNICATION  **Broad Spectrum Antibiotics should be administered within 1 hour of Sepsis diagnosis**  Time Code Sepsis Called/Page Received: 1015  Antibiotics Ordered: Vancomycin, Cefepime  Time of 1st antibiotic administration: 1115  Additional action taken by pharmacy: none  If necessary, Name of Provider/Nurse Contacted: n/a    [name] ,PharmD Clinical Pharmacist  06/19/2022  11:25 AM

## 2022-06-19 NOTE — ED Notes (Signed)
Pt currently one has one IV he is a difficult stick. MD aware and second set of antibiotics started. IV team called and should be here soon.

## 2022-06-19 NOTE — ED Notes (Signed)
Pt not very alert. Can tell me his name but falls asleep every time I am asking him questions. No response if I ask him his birthdate or where he is currently at. Attempted multiple times. Pt currently receiving antibiotics and breathing treatment at this time.

## 2022-06-19 NOTE — Assessment & Plan Note (Signed)
-  Will resume patient's home antihypertensives once able to reliably swallow

## 2022-06-19 NOTE — Assessment & Plan Note (Signed)
Patient is presenting with sudden onset altered mental status, fever up to 101, WBC of 14 and tachycardia up to 105 with junctional rhythm noted (no prior history of junctional rhythm.)  Etiology is uncertain at this time but includes bacteremia given indwelling dialysis catheter versus cardiac event given new rhythm and troponin leak.  No wheezing to suggest COPD exacerbation, however patient's work of breathing is increased.  Chest x-ray not particularly diagnostic.  - Continue broad-spectrum antibiotics - Blood cultures pending - Echocardiogram - CT chest without contrast - RVP - Strep pneumo and Legionella urinary antigens

## 2022-06-19 NOTE — ED Provider Notes (Addendum)
[facility] Provider Note    Event Date/Time   First MD Initiated Contact with Patient 06/19/22 1011     (approximate)   History   Shortness of Breath and Altered Mental Status   HPI  Todd Brown is a 62 y.o. male extensive past medical history including end-stage renal disease on dialysis presented to the ER from assisted living facility after staff given meds this morning reportedly went by to check on him patient with altered mental status.  Was supposed to have dialysis today.  Was found to be hypoxic on room air to 88%.  Placed on 2 L.  Patient is drowsy will answer questions but does seem altered.     Physical Exam   Triage Vital Signs: ED Triage Vitals [06/19/22 1010]  Enc Vitals Group     BP 136/88     Pulse Rate (!) 110     Resp 15     Temp 100.1 F (37.8 C)     Temp Source Axillary     SpO2 100 %     Weight      Height      Head Circumference      Peak Flow      Pain Score      Pain Loc      Pain Edu?      Excl. in [place]?     Most recent vital signs: Vitals:   06/19/22 1128 06/19/22 1130  BP:  133/81  Pulse:  98  Resp:  19  Temp:    SpO2: 99% 99%     Constitutional: Alert but encephalopathic confused and unable to provide much additional history. Eyes: Conjunctivae are normal.  Head: Atraumatic. Nose: No congestion/rhinnorhea. Mouth/Throat: Mucous membranes are moist.   Neck: Painless ROM.  Cardiovascular:   Good peripheral circulation. Respiratory: Normal respiratory effort.  Scattered occasional rhonchi.  Occasional wheeze. Gastrointestinal: Soft and nontender.  Musculoskeletal:  no deformity Neurologic:  MAE spontaneously. No gross focal neurologic deficits are appreciated.      ED Results / Procedures / Treatments   Labs (all labs ordered are listed, but only abnormal results are displayed) Labs Reviewed  LACTIC ACID, PLASMA - Abnormal; Notable for the following components:      Result Value   Lactic  Acid, Venous 2.9 (*)    All other components within normal limits  COMPREHENSIVE METABOLIC PANEL - Abnormal; Notable for the following components:   Chloride 94 (*)    BUN 48 (*)    Creatinine, Ser 6.50 (*)    GFR, Estimated 9 (*)    All other components within normal limits  CBC WITH DIFFERENTIAL/PLATELET - Abnormal; Notable for the following components:   WBC 14.3 (*)    RBC 2.86 (*)    Hemoglobin 9.8 (*)    HCT 30.5 (*)    MCV 106.6 (*)    MCH 34.3 (*)    RDW 17.7 (*)    Neutro Abs 12.8 (*)    Lymphs Abs 0.1 (*)    Monocytes Absolute 1.2 (*)    Abs Immature Granulocytes 0.16 (*)    All other components within normal limits  BLOOD GAS, VENOUS - Abnormal; Notable for the following components:   pO2, Ven 31 (*)    Bicarbonate 29.3 (*)    Acid-Base Excess 3.0 (*)    All other components within normal limits  TROPONIN I (HIGH SENSITIVITY) - Abnormal; Notable for the following components:   Troponin I (High  Sensitivity) 155 (*)    All other components within normal limits  RESP PANEL BY RT-PCR (RSV, FLU A&B, COVID)  RVPGX2  CULTURE, BLOOD (ROUTINE X 2)  CULTURE, BLOOD (ROUTINE X 2)  PROTIME-INR  APTT  URINALYSIS, ROUTINE W REFLEX MICROSCOPIC  LACTIC ACID, PLASMA  CBG MONITORING, ED  TROPONIN I (HIGH SENSITIVITY)     EKG  ED ECG REPORT I, Merlyn Lot, the attending physician, personally viewed and interpreted this ECG.   Date: 06/19/2022  EKG Time: 10:10  Rate: 100  Rhythm: sinus  Axis: normal  Intervals: normal qt  ST&T Change: no stemi, no depressions    RADIOLOGY Please see ED Course for my review and interpretation.  I personally reviewed all radiographic images ordered to evaluate for the above acute complaints and reviewed radiology reports and findings.  These findings were personally discussed with the patient.  Please see medical record for radiology report.    PROCEDURES:  Critical Care performed: Yes, see critical care procedure  note(s)  .Critical Care  Performed by: Merlyn Lot, MD Authorized by: Merlyn Lot, MD   Critical care provider statement:    Critical care time (minutes):  35   Critical care was necessary to treat or prevent imminent or life-threatening deterioration of the following conditions:  Sepsis   Critical care was time spent personally by me on the following activities:  Ordering and performing treatments and interventions, ordering and review of laboratory studies, ordering and review of radiographic studies, pulse oximetry, re-evaluation of patient's condition, review of old charts, obtaining history from patient or surrogate, examination of patient, evaluation of patient's response to treatment, discussions with primary provider, discussions with consultants and development of treatment plan with patient or surrogate    MEDICATIONS ORDERED IN ED: Medications  vancomycin (VANCOCIN) IVPB 1000 mg/200 mL premix (has no administration in time range)  azithromycin (ZITHROMAX) 500 mg in sodium chloride 0.9 % 250 mL IVPB (500 mg Intravenous New Bag/Given 06/19/22 1152)  acetaminophen (TYLENOL) tablet 650 mg (has no administration in time range)  sodium chloride 0.9 % bolus 250 mL (has no administration in time range)  ceFEPIme (MAXIPIME) 2 g in sodium chloride 0.9 % 100 mL IVPB (0 g Intravenous Stopped 06/19/22 1141)  ipratropium-albuterol (DUONEB) 0.5-2.5 (3) MG/3ML nebulizer solution 3 mL (3 mLs Nebulization Given 06/19/22 1124)     IMPRESSION / MDM / [place] / ED COURSE  I reviewed the triage vital signs and the nursing notes.                              Differential diagnosis includes, but is not limited to, Dehydration, sepsis, pna, uti, hypoglycemia, cva, drug effect, withdrawal, encephalitis  Patient presenting to the ER for evaluation of symptoms as described above.  Based on symptoms, risk factors and considered above differential, this presenting complaint could  reflect a potentially life-threatening illness therefore the patient will be placed on continuous pulse oximetry and telemetry for monitoring.  Laboratory evaluation will be sent to evaluate for the above complaints.      Clinical Course as of 06/19/22 1216  Fri Jun 19, 2022  1030 CT head on my review and interpretation without evidence of SDH or IPH. [PR]  1046 CT HEAD WO CONTRAST [PR]  1202 Chest x-ray with pulm vascular congestion.  Given his fever tachycardia lactic acidosis in the setting of patient being a dialysis patient with indwelling catheter patient started on broad-spectrum  antibiotics.  Still confused.  CT head without acute abnormality.  I think his altered mental status is likely secondary to fever and sepsis.  He is not meningitic.  Unclear source at this time.  Still waiting urinalysis.  Troponin is elevated.  Will continue to monitor for repeat troponin.  Likely demand in the setting of sepsis.  Will consult hospitalist for admission. [PR]    Clinical Course User Index [PR] Merlyn Lot, MD      FINAL CLINICAL IMPRESSION(S) / ED DIAGNOSES   Final diagnoses:  Altered mental status, unspecified altered mental status type  Sepsis with encephalopathy without septic shock, due to unspecified organism [facility])  ESRD on dialysis [facility])     Rx / DC Orders   ED Discharge Orders     None        Note:  This document was prepared using Dragon voice recognition software and may include unintentional dictation errors.    Merlyn Lot, MD 06/19/22 1215    Merlyn Lot, MD 06/19/22 [phone]

## 2022-06-19 NOTE — Progress Notes (Signed)
At bedside for HD line assessment by receiving RN. The dressing was noted to be above the insertion site, open to air and the catheter at insertion site was soiled with dried paper tape. Site thoroughly cleaned with CHG and biopatch applied. Covered with transparent gauze. Stitches were not attached to the skin. Primary RN aware and alerted  Colon Flattery, NP.

## 2022-06-19 NOTE — Consult Note (Signed)
[facility] Cardiology  CARDIOLOGY CONSULT NOTE  Patient ID: Todd Brown MRN: [id number] DOB/AGE: 1960/10/05 62 y.o.  Admit date: 06/19/2022 Referring Physician Murvin Donning Primary Physician [place] Primary Cardiologist [facility] Reason for Consultation elevated troponin  HPI: 62 year old gentleman referred for evaluation of elevated troponin.  Patient has known history of end-stage renal disease on chronic hemodialysis, COPD with chronic hypoxic respiratory failure on 2 L, presents to [facility] ED with altered mental status.  Patient noted be febrile and hypoxic.  Patient also tachycardic.  ECG revealed accelerated junctional rhythm at 105 bpm.  Admission blood work revealed high sensitive troponin 155, 193, 194 in the absence of chest pain or new ischemic ECG changes.  Labs were also notable for elevated white count 14,300 and anemia with hemoglobin adequate 9.8 and 30.5, respectively.  Telemetry now reveals sinus rhythm at 89 bpm.  Upon further questioning, patient denies chest pain.  He appears to be oriented name only.  Review of systems complete and found to be negative unless listed above     Past Medical History:  Diagnosis Date   Anemia    Arthritis    Chronic respiratory failure (HCC)    COPD (chronic obstructive pulmonary disease) ([place])    Dialysis patient ([place])    Mon. 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   ESRD (end stage renal disease) ([place])    ESRD (end stage renal disease) on dialysis ([place])    Hyperlipidemia    Hypertension    Malignant hypertension    Pneumonia 2016   Renal artery stenosis (HCC)    Schizophrenia (HCC)     Past Surgical History:  Procedure Laterality Date   A/V FISTULAGRAM N/A 08/06/2016   Procedure: A/V Fistulagram;  Surgeon: Algernon Huxley, MD;  Location: [place] [facility];  Service: Cardiovascular;  Laterality: N/A;   A/V FISTULAGRAM N/A 02/14/2021   Procedure: A/V FISTULAGRAM poss Perm Cath Insertion;   Surgeon: Katha Cabal, MD;  Location: [place] [facility];  Service: Cardiovascular;  Laterality: N/A;   A/V FISTULAGRAM Left 08/26/2021   Procedure: A/V Fistulagram;  Surgeon: Algernon Huxley, MD;  Location: [place] [facility];  Service: Cardiovascular;  Laterality: Left;   A/V SHUNT INTERVENTION N/A 08/06/2016   Procedure: A/V Shunt Intervention;  Surgeon: Algernon Huxley, MD;  Location: [place] [facility];  Service: Cardiovascular;  Laterality: N/A;   AV FISTULA PLACEMENT Left 12/26/2014   Procedure: ARTERIOVENOUS (AV) FISTULA CREATION;  Surgeon: Algernon Huxley, MD;  Location: [facility] ORS;  Service: Vascular;  Laterality: Left;   AV FISTULA PLACEMENT     ESOPHAGOGASTRODUODENOSCOPY (EGD) WITH PROPOFOL N/A 05/06/2017   Procedure: ESOPHAGOGASTRODUODENOSCOPY (EGD) WITH PROPOFOL;  Surgeon: Jonathon Bellows, MD;  Location: [facility] ENDOSCOPY;  Service: Gastroenterology;  Laterality: N/A;   INSERTION OF DIALYSIS CATHETER Right    PERIPHERAL VASCULAR CATHETERIZATION N/A 10/22/2014   Procedure: Dialysis/Perma Catheter Insertion;  Surgeon: Algernon Huxley, MD;  Location: [place] [facility];  Service: Cardiovascular;  Laterality: N/A;   PERIPHERAL VASCULAR CATHETERIZATION N/A 02/28/2015   Procedure: Dialysis/Perma Catheter Removal;  Surgeon: Algernon Huxley, MD;  Location: [place] [facility];  Service: Cardiovascular;  Laterality: N/A;   Repair fx left lower leg     yrs ago (age 73)   REVISON OF ARTERIOVENOUS FISTULA Left 03/27/2022   Procedure: REVISON OF ARTERIOVENOUS FISTULA (JUMP GRAFT);  Surgeon: Katha Cabal, MD;  Location: [facility] ORS;  Service: Vascular;  Laterality:  Left;   TONSILLECTOMY      Medications Prior to Admission  Medication Sig Dispense Refill Last Dose   acetaminophen (TYLENOL) 500 MG tablet Take 500-1,000 mg by mouth every 6 (six) hours as needed for mild pain or moderate pain.      albuterol (VENTOLIN HFA) 108 (90 Base) MCG/ACT inhaler Inhale 1-2 puffs into the lungs every 4 (four) hours as  needed for wheezing or shortness of breath.      amLODipine (NORVASC) 10 MG tablet Take 10 mg by mouth every morning.      ascorbic acid (VITAMIN C) 500 MG tablet Take 1 tablet (500 mg total) by mouth daily.      b complex-vitamin c-folic acid (NEPHRO-VITE) 0.8 MG TABS tablet Take 1 tablet by mouth at bedtime.      calcium acetate (PHOSLO) 667 MG capsule Take 1,334 mg by mouth 3 (three) times daily with meals.      carbamazepine (TEGRETOL) 200 MG tablet Take 1,000 mg by mouth every morning.      cholecalciferol (VITAMIN D) 1000 UNITS tablet Take 1,000 Units by mouth daily.      cloNIDine (CATAPRES - DOSED IN MG/24 HR) 0.2 mg/24hr patch Place 1 patch (0.2 mg total) onto the skin once a week. 4 patch 0    doxazosin (CARDURA) 4 MG tablet Take 1 tablet (4 mg total) by mouth at bedtime. 30 tablet 0    ferrous sulfate 325 (65 FE) MG tablet Take 1 tablet (325 mg total) by mouth daily. 30 tablet 3    fluPHENAZine (PROLIXIN) 2.5 MG tablet Take 2.5 mg by mouth daily.      Fluticasone-Salmeterol (ADVAIR) 250-50 MCG/DOSE AEPB Inhale 1 puff into the lungs 2 (two) times daily. Rinse mouth after use discard 30 days after opening      guaiFENesin-dextromethorphan (ROBITUSSIN DM) 100-10 MG/5ML syrup Take 10 mLs by mouth every 4 (four) hours as needed for cough. 118 mL 0    hydrALAZINE (APRESOLINE) 100 MG tablet Take 1 tablet (100 mg total) by mouth 3 (three) times daily.      isosorbide mononitrate (IMDUR) 60 MG 24 hr tablet Take 60 mg by mouth 2 (two) times daily.      minoxidil (LONITEN) 2.5 MG tablet Take 1 tablet (2.5 mg total) by mouth daily. 30 tablet 0    omeprazole (PRILOSEC) 40 MG capsule Take 40 mg by mouth every morning.      OXYGEN Inhale into the lungs. 2 liters continuous      polyethylene glycol (MIRALAX / GLYCOLAX) 17 g packet Take 17 g by mouth daily as needed for mild constipation.      promethazine-dextromethorphan (PROMETHAZINE-DM) 6.25-15 MG/5ML syrup Take 5 mLs by mouth every 6 (six) hours as  needed.      trihexyphenidyl (ARTANE) 2 MG tablet Take 2 mg by mouth 2 (two) times daily.      zinc sulfate 220 (50 Zn) MG capsule Take 1 capsule (220 mg total) by mouth daily.      Social History   Socioeconomic History   Marital status: Single    Spouse name: Not on file   Number of children: 0   Years of education: Not on file   Highest education level: Not on file  Occupational History   Occupation: Disability   Tobacco Use   Smoking status: Never    Passive exposure: Never   Smokeless tobacco: Never  Vaping Use   Vaping Use: Never used  Substance and Sexual Activity  Alcohol use: Not Currently   Drug use: Not Currently    Types: Marijuana    Comment: not since living at assisted living place 03/2020   Sexual activity: Not Currently  Other Topics Concern   Not on file  Social History Narrative   ** Merged History Encounter **       Living at [facility] [place], [place] is legal guardian    Social Determinants of Health   Financial Resource Strain: Low Risk  (10/08/2018)   Overall Financial Resource Strain (CARDIA)    Difficulty of Paying Living Expenses: Not hard at all  Food Insecurity: No Food Insecurity (10/08/2018)   Hunger Vital Sign    Worried About Running Out of Food in the Last Year: Never true    [place] in the Last Year: Never true  Transportation Needs: No Transportation Needs (10/08/2018)   PRAPARE - Hydrologist (Medical): No    Lack of Transportation (Non-Medical): No  Physical Activity: Unknown (10/08/2018)   Exercise Vital Sign    Days of Exercise per Week: Patient refused    Minutes of Exercise per Session: Patient refused  Stress: No Stress Concern Present (10/08/2018)   [place]    Feeling of Stress : Not at all  Social Connections: Unknown (10/08/2018)   Social Connection and Isolation Panel  [NHANES]    Frequency of Communication with Friends and Family: Patient refused    Frequency of Social Gatherings with Friends and Family: Patient refused    Attends Religious Services: Patient refused    Active Member of Clubs or Organizations: Patient refused    Attends Archivist Meetings: Patient refused    Marital Status: Patient refused  Intimate Partner Violence: Unknown (10/08/2018)   Humiliation, Afraid, Rape, and Kick questionnaire    Fear of Current or Ex-Partner: Patient refused    Emotionally Abused: Patient refused    Physically Abused: Patient refused    Sexually Abused: Patient refused    Family History  Problem Relation Age of Onset   Kidney disease Brother    Diabetes Neg Hx       Review of systems complete and found to be negative unless listed above      PHYSICAL EXAM  General: Well developed, well nourished, in no acute distress HEENT:  Normocephalic and atramatic Neck:  No JVD.  Lungs: Clear bilaterally to auscultation and percussion. Heart: HRRR . Normal S1 and S2 without gallops or murmurs.  Abdomen: Bowel sounds are positive, abdomen soft and non-tender  Msk:  Back normal, normal gait. Normal strength and tone for age. Extremities: No clubbing, cyanosis or edema.   Neuro: Alert and oriented X 3. Psych:  Good affect, responds appropriately  Labs:   Lab Results  Component Value Date   WBC 14.3 (H) 06/19/2022   HGB 9.8 (L) 06/19/2022   HCT 30.5 (L) 06/19/2022   MCV 106.6 (H) 06/19/2022   PLT 163 06/19/2022    Recent Labs  Lab 06/19/22 1039  NA 135  K 4.6  CL 94*  CO2 26  BUN 48*  CREATININE 6.50*  CALCIUM 9.7  PROT 7.3  BILITOT 1.0  ALKPHOS 98  ALT 25  AST 37  GLUCOSE 81   Lab Results  Component Value Date   CKTOTAL 205 09/12/2018   TROPONINI <0.03 10/08/2018    Lab Results  Component Value Date   CHOL  186 10/06/2019   Lab Results  Component Value Date   HDL 77 10/06/2019   Lab Results  Component Value Date    LDLCALC 94 10/06/2019   Lab Results  Component Value Date   TRIG 75 10/06/2019   TRIG 106 08/03/2016   Lab Results  Component Value Date   CHOLHDL 2.4 10/06/2019   No results found for: "LDLDIRECT"    Radiology: [facility] Chest Port 1 View  Result Date: 06/19/2022 CLINICAL DATA:  Questionable sepsis.  Shortness of breath. EXAM: PORTABLE CHEST 1 VIEW COMPARISON:  05/20/2022 FINDINGS: Right chest wall dialysis catheter noted with tips at the superior cavoatrial junction. Heart size is enlarged. Blunting of the costophrenic angles. Moderate diffuse increase interstitial markings are identified bilaterally. Platelike atelectasis noted in the left base. The visualized osseous structures are unremarkable. IMPRESSION: 1. Pulmonary edema and small pleural effusions. 2. Left base atelectasis. 3. Right chest wall dialysis catheter tip at the superior cavoatrial junction. Electronically Signed   By: Kerby Moors M.D.   On: 06/19/2022 10:43   CT HEAD WO CONTRAST  Result Date: 06/19/2022 CLINICAL DATA:  Altered mental status. EXAM: CT HEAD WITHOUT CONTRAST TECHNIQUE: Contiguous axial images were obtained from the base of the skull through the vertex without intravenous contrast. RADIATION DOSE REDUCTION: This exam was performed according to the departmental dose-optimization program which includes automated exposure control, adjustment of the mA and/or kV according to patient size and/or use of iterative reconstruction technique. COMPARISON:  05/20/2022 FINDINGS: Brain: No evidence of acute infarction, hemorrhage, hydrocephalus, extra-axial collection or mass lesion/mass effect. There is mild diffuse low-attenuation within the subcortical and periventricular white matter compatible with chronic microvascular disease. Vascular: No hyperdense vessel or unexpected calcification. Skull: Normal. Negative for fracture or focal lesion. Sinuses/Orbits: Similar appearance of left posterior dislocated lens. The paranasal  sinuses are clear. Bilateral mastoid air cell effusions. Other: None IMPRESSION: 1. No acute intracranial abnormalities. 2. Mild white matter, small vessel ischemic change. 3. Bilateral mastoid air cell effusions. 4. 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 Electronically Signed   By: Kerby Moors M.D.   On: 06/19/2022 10:35   CT CHEST WO CONTRAST  Result Date: 05/20/2022 CLINICAL DATA:  COVID pneumonia short of breath EXAM: CT CHEST WITHOUT CONTRAST TECHNIQUE: Multidetector CT imaging of the chest was performed following the standard protocol without IV contrast. RADIATION DOSE REDUCTION: This exam was performed according to the departmental dose-optimization program which includes automated exposure control, adjustment of the mA and/or kV according to patient size and/or use of iterative reconstruction technique. COMPARISON:  Chest x-ray 05/20/2022 FINDINGS: Cardiovascular: Limited evaluation without intravenous contrast. Mild aortic atherosclerosis. No aneurysm. Dilated pulmonary trunk up to 4.3 cm. Coronary vascular calcification. Cardiomegaly. Small moderate circumferential pericardial effusion measuring up to 14 mm maximal thickness. Vascular catheter tip at the cavoatrial junction. Mediastinum/Nodes: Midline trachea. No suspicious thyroid nodule. Mild mediastinal adenopathy. Left paratracheal node measures 10 mm. Right paratracheal node measures 11 mm. Esophagus within normal limits. Lungs/Pleura: Small bilateral pleural effusions. Partial consolidation in left lower lobe, atelectasis versus pneumonia. 13 mm superior left lower lobe nodule or nodular consolidation, series 4, image 83. Somewhat irregular and nodular consolidative focus in the medial right lower lobe measuring 2.3 by 2 cm, series 4, image 78. Upper Abdomen: No acute finding. Musculoskeletal: Possible healing fracture mid sternum but degraded by motion. IMPRESSION: 1. Cardiomegaly with small to moderate circumferential  pericardial effusion. Small bilateral pleural effusions. Partial consolidation in the left lower lobe, atelectasis versus  pneumonia. 2. 13 mm superior left lower lobe nodule or nodular consolidation. Somewhat irregular and nodular consolidative focus in the medial right lower lobe measuring up to 2.3 cm. Findings could be infectious, inflammatory, or neoplastic in etiology. Short interval CT follow-up recommended to assess for resolution or persistence. 3. Mild mediastinal adenopathy, possibly reactive. 4. Dilated pulmonary trunk suggesting pulmonary arterial hypertension. 5. Aortic atherosclerosis. Aortic Atherosclerosis (ICD10-I70.0). Electronically Signed   By: Donavan Foil M.D.   On: 05/20/2022 17:29    EKG: Accelerated junctional tachycardia 105 bpm  ASSESSMENT AND PLAN:   1.  Mildly elevated troponin, (155, 193, 194), in the setting of altered mental status, fever, tachycardia, and elevated white count consistent with systemic inflammatory response syndrome and possible sepsis, in patient with known end-stage renal disease on chronic hemodialysis, likely not acute coronary syndrome and more consistent with neck elevation in the setting of ESRD and demand supply ischemia in the setting of SIRS 2.  Accelerated junctional rhythm 105 bpm, currently in sinus rhythm at 89 bpm 3.  SIRS, in patient presenting with altered mental status, fever, elevated white count, and tachycardia 4.  Altered mental status 5 . End-stage renal disease on chronic hemodialysis 6.  COPD with hypoxia and likely acute exacerbation  Recommendations  1.  Agree with current therapy 2.  Defer full dose anticoagulation 3.  Reviewed 2D echocardiogram 4.  Further recommendations pending 2D echocardiogram results  Signed: Isaias Cowman MD,PhD, [facility] 06/19/2022, 3:36 PM

## 2022-06-19 NOTE — ED Triage Notes (Addendum)
Pt to ED via [facility] from [facility]. Staff states gave him his meds with no complaints. Staff came back and pt reports SOB and pt found with AMS. Pt currently A&Ox1. Unsure of baseline. No hx dementia. Pt is suppose to have dialysis today. EMS states RA 88% and placed on 2LNC. Ems noted rhonchi bilaterally. Once taken to triage room pt is harder to arouse.   Ems VS:  ST 105 100.6 CBG 146 110/71 95% on 2L Hx COPD.

## 2022-06-19 NOTE — Progress Notes (Signed)
At bedside for PIV insertion. Pt noted to have a left arm restriction due to fistula. Right arm with very poor vasculature. Attempted to cannulate x2 without success. Able to obtain lactic acid and troponin labs. Veins measured to be too small for a 24 G. RN aware

## 2022-06-19 NOTE — Consult Note (Signed)
PHARMACY -  BRIEF ANTIBIOTIC NOTE   Pharmacy has received consult(s) for Vancomycin and Cefepime from an ED provider.  The patient's profile has been reviewed for ht/wt/allergies/indication/available labs.    One time order(s) placed for Vancomycin 1g IV and Cefepime 2g IV x 1 dose each.  Further antibiotics/pharmacy consults should be ordered by admitting physician if indicated.                       Thank you, [name] 06/19/2022  10:14 AM

## 2022-06-19 NOTE — ED Notes (Signed)
IV team at bedside.

## 2022-06-19 NOTE — Progress Notes (Signed)
PHARMACY - PHYSICIAN COMMUNICATION CRITICAL VALUE ALERT - BLOOD CULTURE IDENTIFICATION (BCID)  Todd Brown is an 62 y.o. male who presented to [facility] on 06/19/2022 with a chief complaint of SIRS, bacteremia.   Assessment:  Strep species in 4 of 4 bottles, no resistance detected.  (include suspected source if known)  Name of physician (or Provider) Contacted: Sharion Settler, NP   Current antibiotics: Vanc, Cefepime   Changes to prescribed antibiotics recommended:  Recommendations accepted by provider Will d/c Vanc and Cefepime and begin Ceftriaxone 2 gm IV Q24H on 1/20 @ 0600.   Results for orders placed or performed during the hospital encounter of 06/19/22  Blood Culture ID Panel (Reflexed) (Collected: 06/19/2022 10:39 AM)  Result Value Ref Range   Enterococcus faecalis NOT DETECTED NOT DETECTED   Enterococcus Faecium NOT DETECTED NOT DETECTED   Listeria monocytogenes NOT DETECTED NOT DETECTED   Staphylococcus species NOT DETECTED NOT DETECTED   Staphylococcus aureus (BCID) NOT DETECTED NOT DETECTED   Staphylococcus epidermidis NOT DETECTED NOT DETECTED   Staphylococcus lugdunensis NOT DETECTED NOT DETECTED   Streptococcus species DETECTED (A) NOT DETECTED   Streptococcus agalactiae NOT DETECTED NOT DETECTED   Streptococcus pneumoniae NOT DETECTED NOT DETECTED   Streptococcus pyogenes NOT DETECTED NOT DETECTED   A.calcoaceticus-baumannii NOT DETECTED NOT DETECTED   Bacteroides fragilis NOT DETECTED NOT DETECTED   Enterobacterales NOT DETECTED NOT DETECTED   Enterobacter cloacae complex NOT DETECTED NOT DETECTED   Escherichia coli NOT DETECTED NOT DETECTED   Klebsiella aerogenes NOT DETECTED NOT DETECTED   Klebsiella oxytoca NOT DETECTED NOT DETECTED   Klebsiella pneumoniae NOT DETECTED NOT DETECTED   Proteus species NOT DETECTED NOT DETECTED   Salmonella species NOT DETECTED NOT DETECTED   Serratia marcescens NOT DETECTED NOT DETECTED   Haemophilus influenzae NOT  DETECTED NOT DETECTED   Neisseria meningitidis NOT DETECTED NOT DETECTED   Pseudomonas aeruginosa NOT DETECTED NOT DETECTED   Stenotrophomonas maltophilia NOT DETECTED NOT DETECTED   Candida albicans NOT DETECTED NOT DETECTED   Candida auris NOT DETECTED NOT DETECTED   Candida glabrata NOT DETECTED NOT DETECTED   Candida krusei NOT DETECTED NOT DETECTED   Candida parapsilosis NOT DETECTED NOT DETECTED   Candida tropicalis NOT DETECTED NOT DETECTED   Cryptococcus neoformans/gattii NOT DETECTED NOT DETECTED    Leandrew Keech D 06/19/2022  10:05 PM

## 2022-06-19 NOTE — H&P (Signed)
History and Physical    Patient: Todd Brown UVO:[id number] DOB: 12/22/1960 DOA: 06/19/2022 DOS: the patient was seen and examined on 06/19/2022 PCP: [name], NP  [facility] coming from: [place] [facility]  Chief Complaint:  Chief Complaint  Patient presents with   Shortness of Breath   Altered Mental Status   HPI: Todd Brown is a 62 y.o. male with medical history significant of ESRD on HD, COPD with chronic hypoxic respiratory failure on 2 L at baseline, schizophrenia, anemia of chronic disease, hypertension, hyperlipidemia, who presents to the ED due to altered mental status and shortness of breath.  History limited due to patient's altered mental status  Todd Brown states that he is not experiencing any pain at this time.  Otherwise, he is not able to give any other history  Per chart review, ALF staff provided patient has medications this morning did not note any abnormalities.  When they came back later to check on him, they found him short of breath and altered.  Patient wears 2 L at baseline was not wearing it on EMS arrival.  At that time, he was saturating at 88% prior to being placed back on his home oxygen levels.  Per EMS, patient was tacky at 105, febrile at 100.6 with a blood pressure of 110/71.  ED course: On arrival to the ED, patient was febrile at 100.1 with blood pressure 136/88 and heart rate of 110.  He was saturating at 100% on his home 2 L.  Initial workup remarkable for WBC of 14.3, hemoglobin of 9.8, potassium of 4.6, VBG with pCO2 of 53.  COVID-19, RSV and influenza PCR negative. Chest x-ray was obtained that showed pulmonary edema and small pleural effusions, and CT head was obtained that did not show any acute intracranial abnormalities.  EKG with junctional rhythm with rate of 105.  Troponin elevated at 155.  Due to concern for sepsis, patient was started on cefepime, azithromycin and vancomycin and [facility] contacted for  admission.  Review of Systems: unable to review all systems due to the inability of the patient to answer questions.  Past Medical History:  Diagnosis Date   Anemia    Arthritis    Chronic respiratory failure (HCC)    COPD (chronic obstructive pulmonary disease) ([place])    Dialysis patient ([place])    Mon. 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 [facility];  Service: Cardiovascular;  Laterality: N/A;   A/V FISTULAGRAM N/A 02/14/2021   Procedure: A/V FISTULAGRAM poss Perm Cath Insertion;  Surgeon: Katha Cabal, MD;  Location: [place] [facility];  Service: Cardiovascular;  Laterality: N/A;   A/V FISTULAGRAM Left 08/26/2021   Procedure: A/V Fistulagram;  Surgeon: Algernon Huxley, MD;  Location: [place] [facility];  Service: Cardiovascular;  Laterality: Left;   A/V SHUNT INTERVENTION N/A 08/06/2016   Procedure: A/V Shunt Intervention;  Surgeon: Algernon Huxley, MD;  Location: [place] [facility];  Service: Cardiovascular;  Laterality: N/A;   AV FISTULA PLACEMENT Left 12/26/2014   Procedure: ARTERIOVENOUS (AV) FISTULA CREATION;  Surgeon: Algernon Huxley, MD;  Location: [facility];  Service: Vascular;  Laterality: Left;   AV FISTULA PLACEMENT     ESOPHAGOGASTRODUODENOSCOPY (EGD) WITH PROPOFOL N/A 05/06/2017   Procedure: ESOPHAGOGASTRODUODENOSCOPY (EGD) WITH PROPOFOL;  Surgeon: Jonathon Bellows, MD;  Location: [facility];  Service: Gastroenterology;  Laterality: N/A;   INSERTION OF DIALYSIS CATHETER Right    PERIPHERAL VASCULAR CATHETERIZATION N/A 10/22/2014    Procedure: Dialysis/Perma Catheter Insertion;  Surgeon: Algernon Huxley, MD;  Location: [place] [facility];  Service: Cardiovascular;  Laterality: N/A;   PERIPHERAL VASCULAR CATHETERIZATION N/A 02/28/2015   Procedure: Dialysis/Perma Catheter Removal;  Surgeon: Algernon Huxley, MD;  Location: [place] [facility];  Service: Cardiovascular;  Laterality: N/A;   Repair fx left lower leg     yrs ago (age 63)   REVISON OF ARTERIOVENOUS FISTULA Left 03/27/2022   Procedure: REVISON OF ARTERIOVENOUS FISTULA (JUMP GRAFT);  Surgeon: Katha Cabal, MD;  Location: [facility];  Service: Vascular;  Laterality: Left;   TONSILLECTOMY     Social History:  reports that he has never smoked. He has never been exposed to tobacco smoke. He has never used smokeless tobacco. He reports that he does not currently use alcohol. He reports that he does not currently use drugs after having used the following drugs: Marijuana.  Allergies  Allergen Reactions   Chlorpromazine Other (See Comments)    Reaction:  Unknown , pt states it makes him feel real bad Reaction:  Unknown , pt states it makes him feel real bad     Family History  Problem Relation Age of Onset   Kidney disease Brother    Diabetes Neg Hx     Prior to Admission medications   Medication Sig Start Date End Date Taking? Authorizing Provider  acetaminophen (TYLENOL) 500 MG tablet Take 500-1,000 mg by mouth every 6 (six) hours as needed for mild pain or moderate pain.    [provider]  albuterol (VENTOLIN HFA) 108 (90 Base) MCG/ACT inhaler Inhale 1-2 puffs into the lungs every 4 (four) hours as needed for wheezing or shortness of breath.    [provider]  amLODipine (NORVASC) 10 MG tablet Take 10 mg by mouth every morning.    [provider]  ascorbic acid (VITAMIN C) 500 MG tablet Take 1 tablet (500 mg total) by mouth daily. 05/29/22   Hosie Poisson, MD  b complex-vitamin c-folic acid (NEPHRO-VITE) 0.8 MG TABS tablet Take 1  tablet by mouth at bedtime.    [provider]  calcium acetate (PHOSLO) 667 MG capsule Take 1,334 mg by mouth 3 (three) times daily with meals.    [provider]  carbamazepine (TEGRETOL) 200 MG tablet Take 1,000 mg by mouth every morning.    [provider]  cholecalciferol (VITAMIN D) 1000 UNITS tablet Take 1,000 Units by mouth daily.    [provider]  cloNIDine (CATAPRES - DOSED IN MG/24 HR) 0.2 mg/24hr patch Place 1 patch (0.2 mg total) onto the skin once a week. 02/23/22   Sharen Hones, MD  doxazosin (CARDURA) 4 MG tablet Take 1 tablet (4 mg total) by mouth at bedtime. 02/21/22   Sharen Hones, MD  ferrous sulfate 325 (65 FE) MG tablet Take 1 tablet (325 mg total) by mouth daily. 05/19/15   Fritzi Mandes, MD  fluPHENAZine (PROLIXIN) 2.5 MG tablet Take 2.5 mg by mouth daily.  [provider]  Fluticasone-Salmeterol (ADVAIR) 250-50 MCG/DOSE AEPB Inhale 1 puff into the lungs 2 (two) times daily. Rinse mouth after use discard 30 days after opening    [provider]  guaiFENesin-dextromethorphan (ROBITUSSIN DM) 100-10 MG/5ML syrup Take 10 mLs by mouth every 4 (four) hours as needed for cough. 05/28/22   Hosie Poisson, MD  hydrALAZINE (APRESOLINE) 100 MG tablet Take 1 tablet (100 mg total) by mouth 3 (three) times daily. 10/16/21   Fritzi Mandes, MD  isosorbide mononitrate (IMDUR) 60 MG 24 hr tablet Take 60 mg by mouth 2 (two) times daily.    [provider]  minoxidil (LONITEN) 2.5 MG tablet Take 1 tablet (2.5 mg total) by mouth daily. 02/22/22   Sharen Hones, MD  omeprazole (PRILOSEC) 40 MG capsule Take 40 mg by mouth every morning.    [provider]  OXYGEN Inhale into the lungs. 2 liters continuous    [provider]  polyethylene glycol (MIRALAX / GLYCOLAX) 17 g packet Take 17 g by mouth daily as needed for mild constipation.    [provider]  promethazine-dextromethorphan (PROMETHAZINE-DM) 6.25-15 MG/5ML  syrup Take 5 mLs by mouth every 6 (six) hours as needed. 05/08/22   [provider]  trihexyphenidyl (ARTANE) 2 MG tablet Take 2 mg by mouth 2 (two) times daily.    [provider]  zinc sulfate 220 (50 Zn) MG capsule Take 1 capsule (220 mg total) by mouth daily. 05/29/22   Hosie Poisson, MD    Physical Exam: Vitals:   06/19/22 1215 06/19/22 1245 06/19/22 1300 06/19/22 1358  BP: 128/82 133/75 120/78 134/81  Pulse: 99 93 93 91  Resp: 11 18 (!) 21 16  Temp:  99.5 F (37.5 C)  99.9 F (37.7 C)  TempSrc:  Oral  Oral  SpO2: 90% 91% 94% 91%   Physical Exam Vitals and nursing note reviewed.  Constitutional:      Appearance: He is not ill-appearing.  HENT:     Head: Normocephalic and atraumatic.  Neck:     Vascular: No JVD.  Cardiovascular:     Rate and Rhythm: Regular rhythm. Tachycardia present.     Heart sounds: No murmur heard. Pulmonary:     Effort: Tachypnea and accessory muscle usage present.     Breath sounds: No decreased breath sounds, wheezing, rhonchi or rales.  Abdominal:     Palpations: Abdomen is soft.     Tenderness: There is no abdominal tenderness. There is no guarding.  Musculoskeletal:     Right lower leg: 1+ Pitting Edema present.     Left lower leg: 1+ Pitting Edema present.  Skin:    General: Skin is warm and dry.  Neurological:     Mental Status: He is lethargic.     Comments:  Patient is alert and oriented to self only.  He is unable to follow commands or answer questions appropriately.  Frequently repeating questions that are asked. Able to move all extremities to noxious stimuli - No tremor or rigidity noted    Data Reviewed: CBC with WBC of 14.3, hemoglobin of 9.8, MCV of 106.6, platelets of 163 CMP with sodium of 135, potassium 4.6, chloride 94, bicarb 26, glucose 81, BUN 48, creatinine 6.50, anion 15, AST 37, ALT 25, GFR 9 Lactic acid 2.9 Troponin 155 INR 1.2 PTT 34 COVID-19, influenza and RSV PCR negative  EKG personally  reviewed.  Junctional rhythm with a rate of 105.  J-point elevation in leads V3 through V4.  EKG obtained on 20 May 2022 personally reviewed.  J-point elevation present at that time.  Junctional rhythm is new though.  DG Chest Port 1 View  Result Date: 06/19/2022 CLINICAL DATA:  Questionable sepsis.  Shortness of breath. EXAM: PORTABLE CHEST 1 VIEW COMPARISON:  05/20/2022 FINDINGS: Right chest wall dialysis catheter noted with tips at the superior cavoatrial junction. Heart size is enlarged. Blunting of the costophrenic angles. Moderate diffuse increase interstitial markings are identified bilaterally. Platelike atelectasis noted in the left base. The visualized osseous structures are unremarkable. IMPRESSION: 1. Pulmonary edema and small pleural effusions. 2. Left base atelectasis. 3. Right chest wall dialysis catheter tip at the superior cavoatrial junction. Electronically Signed   By: Kerby Moors M.D.   On: 06/19/2022 10:43   CT HEAD WO CONTRAST  Result Date: 06/19/2022 CLINICAL DATA:  Altered mental status. EXAM: CT HEAD WITHOUT CONTRAST TECHNIQUE: Contiguous axial images were obtained from the base of the skull through the vertex without intravenous contrast. RADIATION DOSE REDUCTION: This exam was performed according to the departmental dose-optimization program which includes automated exposure control, adjustment of the mA and/or kV according to patient size and/or use of iterative reconstruction technique. COMPARISON:  05/20/2022 FINDINGS: Brain: No evidence of acute infarction, hemorrhage, hydrocephalus, extra-axial collection or mass lesion/mass effect. There is mild diffuse low-attenuation within the subcortical and periventricular white matter compatible with chronic microvascular disease. Vascular: No hyperdense vessel or unexpected calcification. Skull: Normal. Negative for fracture or focal lesion. Sinuses/Orbits: Similar appearance of left posterior dislocated lens. The paranasal  sinuses are clear. Bilateral mastoid air cell effusions. Other: None IMPRESSION: 1. No acute intracranial abnormalities. 2. Mild white matter, small vessel ischemic change. 3. Bilateral mastoid air cell effusions. 4. 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 Electronically Signed   By: Kerby Moors M.D.   On: 06/19/2022 10:35    Results are pending, will review when available.  Assessment and Plan: * SIRS (systemic inflammatory response syndrome) (HCC) Patient is presenting with sudden onset altered mental status, fever up to 101, WBC of 14 and tachycardia up to 105 with junctional rhythm noted (no prior history of junctional rhythm.)  Etiology is uncertain at this time but includes bacteremia given indwelling dialysis catheter versus cardiac event given new rhythm and troponin leak.  No wheezing to suggest COPD exacerbation, however patient's work of breathing is increased.  Chest x-ray not particularly diagnostic.  - Continue broad-spectrum antibiotics - Blood cultures pending - Echocardiogram - CT chest without contrast - RVP - Strep pneumo and Legionella urinary antigens  NSTEMI (non-ST elevated myocardial infarction) (HCC) Elevated troponin at 155 with upward trend to 193 on admission.  EKG with junctional rhythm, but no other ST or T wave changes to suggest ischemia.  Patient is not endorsing any chest pain.  No prior history of CAD with normal stress test in 2018.  This may be all secondary to demand in the setting of SIRS, however patient is at risk for ACS.  - Cardiology consultation - Hold on heparin infusion pending cardiology input - Echocardiogram ordered  Acute encephalopathy Likely multifactorial in the setting of acute illness.  Patient is protecting his airway at this time.  No hypercarbia on VBG.  Folate and B12 within normal limits recently  - Delirium precautions - TSH - Swallow evaluation  ESRD on dialysis [facility]) - Nephrology consulted;  appreciate their recommendations - Plan for dialysis today, per nephrology  Essential hypertension - Will resume patient's home antihypertensives once able to reliably  swallow  COPD (chronic obstructive pulmonary disease) (HCC) No wheezing on examination although patient's work of breathing does seem increased.  - DuoNebs every 6 hours - Restart home bronchodilators when able - Continue home supplemental oxygen  Advance Care Planning:   Code Status: Full Code.  Patient unable to provide consent, full code  Consults: Nephrology  Family Communication: No family at bedside  Severity of Illness: The appropriate patient status for this patient is INPATIENT. Inpatient status is judged to be reasonable and necessary in order to provide the required intensity of service to ensure the patient's safety. The patient's presenting symptoms, physical exam findings, and initial radiographic and laboratory data in the context of their chronic comorbidities is felt to place them at high risk for further clinical deterioration. Furthermore, it is not anticipated that the patient will be medically stable for discharge from the hospital within 2 midnights of admission.   * I certify that at the point of admission it is my clinical judgment that the patient will require inpatient hospital care spanning beyond 2 midnights from the point of admission due to high intensity of service, high risk for further deterioration and high frequency of surveillance required.*  Author: Jose Persia, MD 06/19/2022 2:06 PM  For on call review [email or web address].

## 2022-06-20 ENCOUNTER — Inpatient Hospital Stay
Admit: 2022-06-20 | Discharge: 2022-06-20 | Disposition: A | Discharge status: Home / Self Care | Payer: Medicaid Other | Attending: Internal Medicine | Admitting: Internal Medicine

## 2022-06-20 DIAGNOSIS — R7881 Bacteremia: Secondary | ICD-10-CM | POA: Diagnosis present

## 2022-06-20 DIAGNOSIS — R6510 Systemic inflammatory response syndrome (SIRS) of non-infectious origin without acute organ dysfunction: Secondary | ICD-10-CM | POA: Diagnosis not present

## 2022-06-20 LAB — CBC WITH DIFFERENTIAL/PLATELET
Abs Immature Granulocytes: 0.35 10*3/uL — ABNORMAL HIGH (ref 0.00–0.07)
Basophils Absolute: 0 10*3/uL (ref 0.0–0.1)
Basophils Relative: 0 %
Eosinophils Absolute: 0.1 10*3/uL (ref 0.0–0.5)
Eosinophils Relative: 0 %
HCT: 26.7 % — ABNORMAL LOW (ref 39.0–52.0)
Hemoglobin: 8.9 g/dL — ABNORMAL LOW (ref 13.0–17.0)
Immature Granulocytes: 3 %
Lymphocytes Relative: 3 %
Lymphs Abs: 0.3 10*3/uL — ABNORMAL LOW (ref 0.7–4.0)
MCH: 34.1 pg — ABNORMAL HIGH (ref 26.0–34.0)
MCHC: 33.3 g/dL (ref 30.0–36.0)
MCV: 102.3 fL — ABNORMAL HIGH (ref 80.0–100.0)
Monocytes Absolute: 0.7 10*3/uL (ref 0.1–1.0)
Monocytes Relative: 5 %
Neutro Abs: 12.2 10*3/uL — ABNORMAL HIGH (ref 1.7–7.7)
Neutrophils Relative %: 89 %
Platelets: 154 10*3/uL (ref 150–400)
RBC: 2.61 MIL/uL — ABNORMAL LOW (ref 4.22–5.81)
RDW: 17.4 % — ABNORMAL HIGH (ref 11.5–15.5)
WBC: 13.7 10*3/uL — ABNORMAL HIGH (ref 4.0–10.5)
nRBC: 0 % (ref 0.0–0.2)

## 2022-06-20 LAB — T4, FREE: Free T4: 0.43 ng/dL — ABNORMAL LOW (ref 0.61–1.12)

## 2022-06-20 LAB — COMPREHENSIVE METABOLIC PANEL
ALT: 38 U/L (ref 0–44)
AST: 47 U/L — ABNORMAL HIGH (ref 15–41)
Albumin: 3.5 g/dL (ref 3.5–5.0)
Alkaline Phosphatase: 88 U/L (ref 38–126)
Anion gap: 11 (ref 5–15)
BUN: 30 mg/dL — ABNORMAL HIGH (ref 8–23)
CO2: 28 mmol/L (ref 22–32)
Calcium: 9.1 mg/dL (ref 8.9–10.3)
Chloride: 95 mmol/L — ABNORMAL LOW (ref 98–111)
Creatinine, Ser: 3.61 mg/dL — ABNORMAL HIGH (ref 0.61–1.24)
GFR, Estimated: 18 mL/min — ABNORMAL LOW (ref >60)
Glucose, Bld: 104 mg/dL — ABNORMAL HIGH (ref 70–99)
Potassium: 3.7 mmol/L (ref 3.5–5.1)
Sodium: 134 mmol/L — ABNORMAL LOW (ref 135–145)
Total Bilirubin: 0.8 mg/dL (ref 0.3–1.2)
Total Protein: 6.6 g/dL (ref 6.5–8.1)

## 2022-06-20 MED ORDER — HYDRALAZINE HCL 20 MG/ML IJ SOLN: 10.0000 mg | Freq: Four times a day (QID) | INTRAMUSCULAR | Status: DC | PRN

## 2022-06-20 MED ORDER — LIDOCAINE HCL 1 % IJ SOLN
10.0000 mL | Freq: Once | INTRAMUSCULAR | Status: AC
  Administered 2022-06-20: 10 mL
  Filled 2022-06-20: qty 10

## 2022-06-20 MED ORDER — LIDOCAINE HCL 1 % IJ SOLN
20.0000 mL | Freq: Once | INTRAMUSCULAR | Status: AC
  Administered 2022-06-20: 20 mL

## 2022-06-20 MED ORDER — HEPARIN SODIUM (PORCINE) 1000 UNIT/ML IJ SOLN
INTRAMUSCULAR | Status: AC
  Filled 2022-06-20: qty 4

## 2022-06-20 NOTE — Progress Notes (Signed)
[facility]  ROUNDING NOTE   Subjective:   Todd Brown is a 62 year old male with past medical conditions including COPD, hypertension, DVT, schizophrenia, and end-stage renal disease on hemodialysis.  Patient presents to the emergency department with somnolence. Patient has been admitted for ESRD on dialysis ([place]) [N18.6, Z99.2] Acute encephalopathy [G93.40] Altered mental status, unspecified altered mental status type [R41.82] Sepsis with encephalopathy without septic shock, due to unspecified organism ([place]) [A41.9, R65.20, G93.41] Bacteremia [R78.81]  Patient is known to our practice and receives outpatient dialysis treatments at [facility] on a MWF schedule, supervised by Dr. Holley Raring.  Last treatment was completed on Wednesday.According to chart review, patient was given his morning meds and when checked on later, it was difficult to arouse the patient. Patient on 2L nasal cannula, No lower extremity edema.   Labs on ED arrival include BUN 48 and creatinine 6.5 with GFR 9. Troponin 155. Lactic acid 2.9 with Hgb 9.8. Respiratory panel negative. CT head shows no acute findings. Chest xray shows some pulmonary edema.   Patient is currently receiving dialysis. Tolerating it well. Was able to eat breakfast this morning.     Objective:  Vital signs in last 24 hours:  Temp:  [97.7 F (36.5 C)-99.9 F (37.7 C)] 98.7 F (37.1 C) (01/20 0729) Pulse Rate:  [67-109] 73 (01/20 1030) Resp:  [11-26] 17 (01/20 1030) BP: (120-192)/(75-98) 158/83 (01/20 1030) SpO2:  [90 %-99 %] 97 % (01/20 1030) Weight:  [76.1 kg-79.2 kg] 76.8 kg (01/20 0731)  Weight change:  Filed Weights   06/19/22 1418 06/20/22 0622 06/20/22 0731  Weight: 79.2 kg 76.1 kg 76.8 kg    Intake/Output: I/O last 3 completed shifts: In: 836.6 [IV DDUKGURKY:706.2] Out: -    Intake/Output this shift:  Total I/O In: 240 [P.O.:240] Out: -   Physical Exam: General: Resting comfortably   Head:  Normocephalic, atraumatic. Moist oral mucosal membranes  Eyes: Anicteric  Lungs:  Clear to auscultation  Heart: Regular rate and rhythm  Abdomen:  Soft, tender, mild distension  Extremities:  Trace peripheral edema.  Neurologic: Nonfocal, moving all four extremities  Skin: No lesions  Access: Rt Permcath    Basic Metabolic Panel: Recent Labs  Lab 06/19/22 1039  NA 135  K 4.6  CL 94*  CO2 26  GLUCOSE 81  BUN 48*  CREATININE 6.50*  CALCIUM 9.7     Liver Function Tests: Recent Labs  Lab 06/19/22 1039  AST 37  ALT 25  ALKPHOS 98  BILITOT 1.0  PROT 7.3  ALBUMIN 4.0    No results for input(s): "LIPASE", "AMYLASE" in the last 168 hours. No results for input(s): "AMMONIA" in the last 168 hours.  CBC: Recent Labs  Lab 06/19/22 1039  WBC 14.3*  NEUTROABS 12.8*  HGB 9.8*  HCT 30.5*  MCV 106.6*  PLT 163     Cardiac Enzymes: No results for input(s): "CKTOTAL", "CKMB", "CKMBINDEX", "TROPONINI" in the last 168 hours.  BNP: Invalid input(s): "POCBNP"  CBG: No results for input(s): "GLUCAP" in the last 168 hours.  Microbiology: Results for orders placed or performed during the hospital encounter of 06/19/22  Resp panel by RT-PCR (RSV, Flu A&B, Covid) Anterior Nasal Swab     Status: None   Collection Time: 06/19/22 10:31 AM   Specimen: Anterior Nasal Swab  Result Value Ref Range Status   SARS Coronavirus 2 by RT PCR NEGATIVE NEGATIVE Final    Comment: (NOTE) SARS-CoV-2 target nucleic acids are NOT DETECTED.  The  SARS-CoV-2 RNA is generally detectable in upper respiratory specimens during the acute phase of infection. The lowest concentration of SARS-CoV-2 viral copies this assay can detect is 138 copies/mL. A negative result does not preclude SARS-Cov-2 infection and should not be used as the sole basis for treatment or other patient management decisions. A negative result may occur with  improper specimen collection/handling, submission of specimen  other than nasopharyngeal swab, presence of viral mutation(s) within the areas targeted by this assay, and inadequate number of viral copies(<138 copies/mL). A negative result must be combined with clinical observations, patient history, and epidemiological information. The expected result is Negative.  Fact Sheet for Patients:  EntrepreneurPulse.com.au  Fact Sheet for Healthcare Providers:  IncredibleEmployment.be  This test is no t yet approved or cleared by the [place] FDA and  has been authorized for detection and/or diagnosis of SARS-CoV-2 by FDA under an Emergency Use Authorization (EUA). This EUA will remain  in effect (meaning this test can be used) for the duration of the COVID-19 declaration under Section 564(b)(1) of the Act, 21 U.S.C.section 360bbb-3(b)(1), unless the authorization is terminated  or revoked sooner.       Influenza A by PCR NEGATIVE NEGATIVE Final   Influenza B by PCR NEGATIVE NEGATIVE Final    Comment: (NOTE) The Xpert Xpress SARS-CoV-2/FLU/RSV plus assay is intended as an aid in the diagnosis of influenza from Nasopharyngeal swab specimens and should not be used as a sole basis for treatment. Nasal washings and aspirates are unacceptable for Xpert Xpress SARS-CoV-2/FLU/RSV testing.  Fact Sheet for Patients: EntrepreneurPulse.com.au  Fact Sheet for Healthcare Providers: IncredibleEmployment.be  This test is not yet approved or cleared by the [place] FDA and has been authorized for detection and/or diagnosis of SARS-CoV-2 by FDA under an Emergency Use Authorization (EUA). This EUA will remain in effect (meaning this test can be used) for the duration of the COVID-19 declaration under Section 564(b)(1) of the Act, 21 U.S.C. section 360bbb-3(b)(1), unless the authorization is terminated or revoked.     Resp Syncytial Virus by PCR NEGATIVE NEGATIVE Final     Comment: (NOTE) Fact Sheet for Patients: EntrepreneurPulse.com.au  Fact Sheet for Healthcare Providers: IncredibleEmployment.be  This test is not yet approved or cleared by the [place] FDA and has been authorized for detection and/or diagnosis of SARS-CoV-2 by FDA under an Emergency Use Authorization (EUA). This EUA will remain in effect (meaning this test can be used) for the duration of the COVID-19 declaration under Section 564(b)(1) of the Act, 21 U.S.C. section 360bbb-3(b)(1), unless the authorization is terminated or revoked.  Performed at [facility], [address] [place], [place] [address]   Blood Culture (routine x 2)     Status: None (Preliminary result)   Collection Time: 06/19/22 10:39 AM   Specimen: BLOOD  Result Value Ref Range Status   Specimen Description   Final    BLOOD RIGHT [facility] Performed at [facility], [address], [place], [place] [address]    Special Requests   Final    BOTTLES DRAWN AEROBIC AND ANAEROBIC Blood Culture adequate volume Performed at [facility], [address] [place],  [address]    Culture  Setup Time   Final    Organism ID to follow IN BOTH AEROBIC AND ANAEROBIC BOTTLES GRAM POSITIVE COCCI CRITICAL RESULT CALLED TO, READ BACK BY AND VERIFIED WITH: JASON ROBBINS 06/19/2022 AT 2147 SRR    Culture GRAM POSITIVE COCCI  Final   Report Status  PENDING  Incomplete  Blood Culture ID Panel (Reflexed)     Status: Abnormal   Collection Time: 06/19/22 10:39 AM  Result Value Ref Range Status   Enterococcus faecalis NOT DETECTED NOT DETECTED Final   Enterococcus Faecium NOT DETECTED NOT DETECTED Final   Listeria monocytogenes NOT DETECTED NOT DETECTED Final   Staphylococcus species NOT DETECTED NOT DETECTED Final   Staphylococcus aureus (BCID) NOT DETECTED NOT DETECTED Final   Staphylococcus epidermidis NOT DETECTED NOT DETECTED Final   Staphylococcus  lugdunensis NOT DETECTED NOT DETECTED Final   Streptococcus species DETECTED (A) NOT DETECTED Final    Comment: Not Enterococcus species, Streptococcus agalactiae, Streptococcus pyogenes, or Streptococcus pneumoniae. CRITICAL RESULT CALLED TO, READ BACK BY AND VERIFIED WITH: JASON ROBBINS 06/19/2022 AT 2147 SRR    Streptococcus agalactiae NOT DETECTED NOT DETECTED Final   Streptococcus pneumoniae NOT DETECTED NOT DETECTED Final   Streptococcus pyogenes NOT DETECTED NOT DETECTED Final   A.calcoaceticus-baumannii NOT DETECTED NOT DETECTED Final   Bacteroides fragilis NOT DETECTED NOT DETECTED Final   Enterobacterales NOT DETECTED NOT DETECTED Final   Enterobacter cloacae complex NOT DETECTED NOT DETECTED Final   Escherichia coli NOT DETECTED NOT DETECTED Final   Klebsiella aerogenes NOT DETECTED NOT DETECTED Final   Klebsiella oxytoca NOT DETECTED NOT DETECTED Final   Klebsiella pneumoniae NOT DETECTED NOT DETECTED Final   Proteus species NOT DETECTED NOT DETECTED Final   Salmonella species NOT DETECTED NOT DETECTED Final   Serratia marcescens NOT DETECTED NOT DETECTED Final   Haemophilus influenzae NOT DETECTED NOT DETECTED Final   Neisseria meningitidis NOT DETECTED NOT DETECTED Final   Pseudomonas aeruginosa NOT DETECTED NOT DETECTED Final   Stenotrophomonas maltophilia NOT DETECTED NOT DETECTED Final   Candida albicans NOT DETECTED NOT DETECTED Final   Candida auris NOT DETECTED NOT DETECTED Final   Candida glabrata NOT DETECTED NOT DETECTED Final   Candida krusei NOT DETECTED NOT DETECTED Final   Candida parapsilosis NOT DETECTED NOT DETECTED Final   Candida tropicalis NOT DETECTED NOT DETECTED Final   Cryptococcus neoformans/gattii NOT DETECTED NOT DETECTED Final    Comment: Performed at [facility], [address] [place], [place] [address]  Blood Culture (routine x 2)     Status: None (Preliminary result)   Collection Time: 06/19/22 10:49 AM   Specimen: BLOOD   Result Value Ref Range Status   Specimen Description   Final    BLOOD RIGHT [facility] Performed at [facility], [address], [place], [place] [address]    Special Requests   Final    BOTTLES DRAWN AEROBIC AND ANAEROBIC Blood Culture adequate volume Performed at [facility], [address] [place], [place] [address]    Culture  Setup Time   Final    IN BOTH AEROBIC AND ANAEROBIC BOTTLES GRAM POSITIVE COCCI CRITICAL RESULT CALLED TO, READ BACK BY AND VERIFIED WITH: JASON ROBBINS 06/19/2022 AT 2147 SRR    Culture GRAM POSITIVE COCCI  Final   Report Status PENDING  Incomplete  Culture, blood (Routine X 2) w Reflex to ID Panel     Status: None (Preliminary result)   Collection Time: 06/19/22  7:40 PM   Specimen: BLOOD  Result Value Ref Range Status   Specimen Description BLOOD BLOOD RIGHT HAND  Final   Special Requests   Final    BOTTLES DRAWN AEROBIC ONLY Blood Culture results may not be optimal due to an inadequate volume of blood received in culture bottles   Culture  Final    NO GROWTH < 12 HOURS Performed at [facility], [address] [place], [place] [address]    Report Status PENDING  Incomplete  Culture, blood (Routine X 2) w Reflex to ID Panel     Status: None (Preliminary result)   Collection Time: 06/19/22  7:40 PM   Specimen: BLOOD  Result Value Ref Range Status   Specimen Description BLOOD THUMB RIGHT  Final   Special Requests IN PEDIATRIC BOTTLE Blood Culture adequate volume  Final   Culture   Final    NO GROWTH < 12 HOURS Performed at [facility], [address] [place], [place] [address]    Report Status PENDING  Incomplete    Coagulation Studies: Recent Labs    06/19/22 1039  LABPROT 14.6  INR 1.2     Urinalysis: No results for input(s): "COLORURINE", "LABSPEC", "PHURINE", "GLUCOSEU", "HGBUR", "BILIRUBINUR", "KETONESUR", "PROTEINUR", "UROBILINOGEN", "NITRITE", "LEUKOCYTESUR" in the last 72 hours.  Invalid  input(s): "APPERANCEUR"    Imaging: CT CHEST WO CONTRAST  Result Date: 06/19/2022 CLINICAL DATA:  Respiratory illness with nondiagnostic x-ray. Congestion, cough, pulmonary edema. EXAM: CT CHEST WITHOUT CONTRAST TECHNIQUE: Multidetector CT imaging of the chest was performed following the standard protocol without IV contrast. RADIATION DOSE REDUCTION: This exam was performed according to the departmental dose-optimization program which includes automated exposure control, adjustment of the mA and/or kV according to patient size and/or use of iterative reconstruction technique. COMPARISON:  Chest radiograph 06/19/2022 CT chest 05/20/2022 FINDINGS: Cardiovascular: Diffuse cardiac enlargement with moderate pericardial effusion. Similar appearance to previous study. Normal caliber thoracic aorta with scattered calcification. Calcification in the coronary arteries. Prominent central pulmonary arteries may indicate evidence of pulmonary arterial hypertension. Mediastinum/Nodes: Right central venous catheter with tip at the cavoatrial junction region. Evaluation of lymph nodes is limited without IV contrast material but there appear to be scattered prominent lymph nodes in the thoracic inlet, supraclavicular regions, and in the mediastinum. Largest nodes measure about 12 mm short axis dimension. Esophagus is decompressed. Thyroid gland is unremarkable. Lungs/Pleura: Motion artifact limits evaluation. Atelectasis in the lung bases. Diffuse interstitial pattern to the lungs is most likely to represent edema. A 1.6 cm diameter pulmonary nodule is demonstrated in the superior segment of the right lower lung, series 3, image 71. A left lower lung nodule is demonstrated on image 83, measuring 11 mm diameter. Nodules are unchanged since prior study. Upper Abdomen: Diffuse hepatic enlargement. No focal lesions are demonstrated. Kidneys are somewhat atrophic. Musculoskeletal: Degenerative changes in the spine. No destructive  bone lesions. Old healing nondepressed midsternal fracture. IMPRESSION: 1. Diffuse cardiac enlargement with pericardial effusion similar to prior study. 2. Aortic atherosclerosis with coronary artery calcification. 3. Enlarged central pulmonary arteries may indicate pulmonary arterial hypertension. 4. Atelectasis in the lung bases similar prior study. 5. Prominent mediastinal lymph nodes are nonspecific but may be reactive. 6. Bilateral pulmonary nodules as detailed above. Largest measures 1.6 cm in diameter. Similar appearance to previous study. Consider one of the following in 3 months for both low-risk and high-risk individuals: (a) repeat chest CT, (b) follow-up PET-CT, or (c) tissue sampling. This recommendation follows the consensus statement: Guidelines for Management of Incidental Pulmonary Nodules Detected on CT Images: From the Fleischner Society 2017; Radiology 2017; 284:228-243. Electronically Signed   By: Lucienne Capers M.D.   On: 06/19/2022 17:00   DG Chest Port 1 View  Result Date: 06/19/2022 CLINICAL DATA:  Questionable sepsis.  Shortness of breath. EXAM: PORTABLE CHEST 1 VIEW  COMPARISON:  05/20/2022 FINDINGS: Right chest wall dialysis catheter noted with tips at the superior cavoatrial junction. Heart size is enlarged. Blunting of the costophrenic angles. Moderate diffuse increase interstitial markings are identified bilaterally. Platelike atelectasis noted in the left base. The visualized osseous structures are unremarkable. IMPRESSION: 1. Pulmonary edema and small pleural effusions. 2. Left base atelectasis. 3. Right chest wall dialysis catheter tip at the superior cavoatrial junction. Electronically Signed   By: Kerby Moors M.D.   On: 06/19/2022 10:43   CT HEAD WO CONTRAST  Result Date: 06/19/2022 CLINICAL DATA:  Altered mental status. EXAM: CT HEAD WITHOUT CONTRAST TECHNIQUE: Contiguous axial images were obtained from the base of the skull through the vertex without intravenous  contrast. RADIATION DOSE REDUCTION: This exam was performed according to the departmental dose-optimization program which includes automated exposure control, adjustment of the mA and/or kV according to patient size and/or use of iterative reconstruction technique. COMPARISON:  05/20/2022 FINDINGS: Brain: No evidence of acute infarction, hemorrhage, hydrocephalus, extra-axial collection or mass lesion/mass effect. There is mild diffuse low-attenuation within the subcortical and periventricular white matter compatible with chronic microvascular disease. Vascular: No hyperdense vessel or unexpected calcification. Skull: Normal. Negative for fracture or focal lesion. Sinuses/Orbits: Similar appearance of left posterior dislocated lens. The paranasal sinuses are clear. Bilateral mastoid air cell effusions. Other: None IMPRESSION: 1. No acute intracranial abnormalities. 2. Mild white matter, small vessel ischemic change. 3. Bilateral mastoid air cell effusions. 4. 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 Electronically Signed   By: Kerby Moors M.D.   On: 06/19/2022 10:35     Medications:    anticoagulant sodium citrate     cefTRIAXone (ROCEPHIN)  IV 2 g (06/20/22 0527)    Chlorhexidine Gluconate Cloth  6 each Topical Q0600   heparin  5,000 Units Subcutaneous Q8H   heparin sodium (porcine)       ipratropium-albuterol  3 mL Nebulization Q6H   lidocaine  10 mL Infiltration Once   sodium chloride flush  10-40 mL Intracatheter Q12H   sodium chloride flush  3 mL Intravenous Q12H   acetaminophen **OR** acetaminophen, alteplase, anticoagulant sodium citrate, heparin, heparin sodium (porcine), hydrALAZINE, lidocaine (PF), lidocaine-prilocaine, pentafluoroprop-tetrafluoroeth, polyethylene glycol, sodium chloride flush  Assessment/ Plan:  Todd Brown is a 62 y.o.  male ith past medical conditions including COPD, hypertension, DVT, schizophrenia, and end-stage renal disease on  hemodialysis.  Patient presents to the emergency department with somnolence. Patient has been admitted for ESRD on dialysis ([place]) [N18.6, Z99.2] Acute encephalopathy [G93.40] Altered mental status, unspecified altered mental status type [R41.82] Sepsis with encephalopathy without septic shock, due to unspecified organism ([place]) [A41.9, R65.20, G93.41] Bacteremia [R78.81]  [facility]/MWF/Rt Permcath  End stage renal disease on hemodialysis. Last treatment received on Wednesday. Patient currently receiving dialysis. Tolerating HD well. Streptococcus detected will have vascular remove CVC today after HD.    2. Anemia of chronic kidney disease Lab Results  Component Value Date   HGB 9.8 (L) 06/19/2022   Hgb acceptable at this time, will monitor  3. Secondary Hyperparathyroidism: with outpatient labs: PTH 459, phosphorus 3.4, calcium 9.7 on 06/08/22.   Lab Results  Component Value Date   PTH 194 (H) 05/11/2017   CALCIUM 9.7 06/19/2022   CAION 1.19 03/27/2022   PHOS 3.7 05/20/2022    Prescribed calcium acetate and cholecalciferol outpatient.   4.  Hypertension with chronic kidney disease.  Home regimen includes amlodipine, clonidine, hydralazine, irbesartan, isosorbide, doxazosin and minoxidil. All currently  held. Blood pressure acceptable, 140/75   LOS: 1 Xiadani Damman P Awesome Jared 1/20/202410:58 AM

## 2022-06-20 NOTE — Progress Notes (Signed)
  PROGRESS NOTE    Todd Brown  IOX:[id number] DOB: [birth year] DOA: 06/19/2022 PCP: [name], NP  [id number]  LOS: 1 day   Brief hospital course:   Assessment & Plan: Todd Brown is a 62 y.o. male with medical history significant of ESRD on HD, COPD with chronic hypoxic respiratory failure on 2 L at baseline, schizophrenia, anemia of chronic disease, hypertension, hyperlipidemia, who presented from group home to the ED due to altered mental status and shortness of breath.    GPC bacteremia --blood cx 4 of 4 pos for GPC, likely contamination from PermCath. --cont ceftriaxone --vascular surgeon to remove Permcath today --Echo  Severe sepsis fever up to 101, WBC of 14 and tachycardia up to 105, with lactic acid 2.9.  source bacteremia.  Troponin elevation 2/2 demand ischemia --trop 190's flat. --cardiology consulted --Echo pending  Acute encephalopathy Likely multifactorial in the setting of acute illness.  Patient is protecting his airway at this time.  No hypercarbia on VBG.  Folate and B12 within normal limits recently - Delirium precautions  ESRD on dialysis (Todd Brown) --iHD per nephrology  Essential hypertension --hold home BP meds  COPD (chronic obstructive pulmonary disease) ([place]) Chronic hypoxic respiratory failure on 2 L at baseline --does not appear to be in exacerbation   DVT prophylaxis: Heparin SQ Code Status: Full code  Family Communication:  Level of care: Med-Surg Dispo:   The patient is from: group home Anticipated d/c is to: group home Anticipated d/c date is: >3 days   Subjective and Interval History:  Pt was reportedly awake and asking to eat in the morning.  After dialysis, pt was sleeping again and refused to interact.    Objective: Vitals:   06/20/22 1138 06/20/22 1153 06/20/22 1327 06/20/22 1645  BP:  (!) 154/81  (!) 155/88  Pulse:  74  80  Resp:  17  18  Temp:  98.1 F (36.7 C)  97.8 F (36.6 C)  TempSrc:      SpO2:   96% 96% 98%  Weight: 75.8 kg       Intake/Output Summary (Last 24 hours) at 06/20/2022 1746 Last data filed at 06/20/2022 1121 Gross per 24 hour  Intake 240 ml  Output 2 ml  Net 238 ml   Filed Weights   06/20/22 0622 06/20/22 0731 06/20/22 1138  Weight: 76.1 kg 76.8 kg 75.8 kg    Examination:   Constitutional: NAD, kept eyes closed, refused to interact CV: No cyanosis.   RESP: normal respiratory effort, on RA Extremities: edema in BLE SKIN: warm, dry   Data Reviewed: I have personally reviewed labs and imaging studies  Time spent: 50 minutes  Enzo Bi, MD [facility] If 7PM-7AM, please contact night-coverage 06/20/2022, 5:46 PM

## 2022-06-20 NOTE — Progress Notes (Signed)
[facility] Cardiology  SUBJECTIVE: Patient laying in bed, 2D echocardiogram being performed   Vitals:   06/20/22 1121 06/20/22 1138 06/20/22 1153 06/20/22 1327  BP: 131/83  (!) 154/81   Pulse: 73  74   Resp: 16  17   Temp: 98.9 F (37.2 C)  98.1 F (36.7 C)   TempSrc: Oral     SpO2: 96%  96% 96%  Weight:  75.8 kg       Intake/Output Summary (Last 24 hours) at 06/20/2022 1355 Last data filed at 06/20/2022 1121 Gross per 24 hour  Intake 1076.56 ml  Output 2 ml  Net 1074.56 ml      PHYSICAL EXAM  General: Well developed, well nourished, in no acute distress HEENT:  Normocephalic and atramatic Neck:  No JVD.  Lungs: Clear bilaterally to auscultation and percussion. Heart: HRRR . Normal S1 and S2 without gallops or murmurs.  Abdomen: Bowel sounds are positive, abdomen soft and non-tender  Msk:  Back normal, normal gait. Normal strength and tone for age. Extremities: No clubbing, cyanosis or edema.   Neuro: Alert and oriented X 3. Psych:  Good affect, responds appropriately   LABS: Basic Metabolic Panel: Recent Labs    06/19/22 1039 06/20/22 1120  NA 135 134*  K 4.6 3.7  CL 94* 95*  CO2 26 28  GLUCOSE 81 104*  BUN 48* 30*  CREATININE 6.50* 3.61*  CALCIUM 9.7 9.1   Liver Function Tests: Recent Labs    06/19/22 1039 06/20/22 1120  AST 37 47*  ALT 25 38  ALKPHOS 98 88  BILITOT 1.0 0.8  PROT 7.3 6.6  ALBUMIN 4.0 3.5   No results for input(s): "LIPASE", "AMYLASE" in the last 72 hours. CBC: Recent Labs    06/19/22 1039 06/20/22 1120  WBC 14.3* 13.7*  NEUTROABS 12.8* 12.2*  HGB 9.8* 8.9*  HCT 30.5* 26.7*  MCV 106.6* 102.3*  PLT 163 154   Cardiac Enzymes: No results for input(s): "CKTOTAL", "CKMB", "CKMBINDEX", "TROPONINI" in the last 72 hours. BNP: Invalid input(s): "POCBNP" D-Dimer: No results for input(s): "DDIMER" in the last 72 hours. Hemoglobin A1C: No results for input(s): "HGBA1C" in the last 72 hours. Fasting Lipid Panel: No results for  input(s): "CHOL", "HDL", "LDLCALC", "TRIG", "CHOLHDL", "LDLDIRECT" in the last 72 hours. Thyroid Function Tests: Recent Labs    06/19/22 1039  TSH >50.500*   Anemia Panel: No results for input(s): "VITAMINB12", "FOLATE", "FERRITIN", "TIBC", "IRON", "RETICCTPCT" in the last 72 hours.  CT CHEST WO CONTRAST  Result Date: 06/19/2022 CLINICAL DATA:  Respiratory illness with nondiagnostic x-ray. Congestion, cough, pulmonary edema. EXAM: CT CHEST WITHOUT CONTRAST TECHNIQUE: Multidetector CT imaging of the chest was performed following the standard protocol without IV contrast. RADIATION DOSE REDUCTION: This exam was performed according to the departmental dose-optimization program which includes automated exposure control, adjustment of the mA and/or kV according to patient size and/or use of iterative reconstruction technique. COMPARISON:  Chest radiograph 06/19/2022 CT chest 05/20/2022 FINDINGS: Cardiovascular: Diffuse cardiac enlargement with moderate pericardial effusion. Similar appearance to previous study. Normal caliber thoracic aorta with scattered calcification. Calcification in the coronary arteries. Prominent central pulmonary arteries may indicate evidence of pulmonary arterial hypertension. Mediastinum/Nodes: Right central venous catheter with tip at the cavoatrial junction region. Evaluation of lymph nodes is limited without IV contrast material but there appear to be scattered prominent lymph nodes in the thoracic inlet, supraclavicular regions, and in the mediastinum. Largest nodes measure about 12 mm short axis dimension. Esophagus is decompressed. Thyroid gland  is unremarkable. Lungs/Pleura: Motion artifact limits evaluation. Atelectasis in the lung bases. Diffuse interstitial pattern to the lungs is most likely to represent edema. A 1.6 cm diameter pulmonary nodule is demonstrated in the superior segment of the right lower lung, series 3, image 71. A left lower lung nodule is demonstrated on  image 83, measuring 11 mm diameter. Nodules are unchanged since prior study. Upper Abdomen: Diffuse hepatic enlargement. No focal lesions are demonstrated. Kidneys are somewhat atrophic. Musculoskeletal: Degenerative changes in the spine. No destructive bone lesions. Old healing nondepressed midsternal fracture. IMPRESSION: 1. Diffuse cardiac enlargement with pericardial effusion similar to prior study. 2. Aortic atherosclerosis with coronary artery calcification. 3. Enlarged central pulmonary arteries may indicate pulmonary arterial hypertension. 4. Atelectasis in the lung bases similar prior study. 5. Prominent mediastinal lymph nodes are nonspecific but may be reactive. 6. Bilateral pulmonary nodules as detailed above. Largest measures 1.6 cm in diameter. Similar appearance to previous study. Consider one of the following in 3 months for both low-risk and high-risk individuals: (a) repeat chest CT, (b) follow-up PET-CT, or (c) tissue sampling. This recommendation follows the consensus statement: Guidelines for Management of Incidental Pulmonary Nodules Detected on CT Images: From the Fleischner Society 2017; Radiology 2017; 284:228-243. Electronically Signed   By: Lucienne Capers M.D.   On: 06/19/2022 17:00   DG Chest Port 1 View  Result Date: 06/19/2022 CLINICAL DATA:  Questionable sepsis.  Shortness of breath. EXAM: PORTABLE CHEST 1 VIEW COMPARISON:  05/20/2022 FINDINGS: Right chest wall dialysis catheter noted with tips at the superior cavoatrial junction. Heart size is enlarged. Blunting of the costophrenic angles. Moderate diffuse increase interstitial markings are identified bilaterally. Platelike atelectasis noted in the left base. The visualized osseous structures are unremarkable. IMPRESSION: 1. Pulmonary edema and small pleural effusions. 2. Left base atelectasis. 3. Right chest wall dialysis catheter tip at the superior cavoatrial junction. Electronically Signed   By: Kerby Moors M.D.   On:  06/19/2022 10:43   CT HEAD WO CONTRAST  Result Date: 06/19/2022 CLINICAL DATA:  Altered mental status. EXAM: CT HEAD WITHOUT CONTRAST TECHNIQUE: Contiguous axial images were obtained from the base of the skull through the vertex without intravenous contrast. RADIATION DOSE REDUCTION: This exam was performed according to the departmental dose-optimization program which includes automated exposure control, adjustment of the mA and/or kV according to patient size and/or use of iterative reconstruction technique. COMPARISON:  05/20/2022 FINDINGS: Brain: No evidence of acute infarction, hemorrhage, hydrocephalus, extra-axial collection or mass lesion/mass effect. There is mild diffuse low-attenuation within the subcortical and periventricular white matter compatible with chronic microvascular disease. Vascular: No hyperdense vessel or unexpected calcification. Skull: Normal. Negative for fracture or focal lesion. Sinuses/Orbits: Similar appearance of left posterior dislocated lens. The paranasal sinuses are clear. Bilateral mastoid air cell effusions. Other: None IMPRESSION: 1. No acute intracranial abnormalities. 2. Mild white matter, small vessel ischemic change. 3. Bilateral mastoid air cell effusions. 4. 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 Electronically Signed   By: Kerby Moors M.D.   On: 06/19/2022 10:35     Echo pending  TELEMETRY: Sinus rhythm 76 bpm:  ASSESSMENT AND PLAN:  Principal Problem:   SIRS (systemic inflammatory response syndrome) (HCC) Active Problems:   COPD (chronic obstructive pulmonary disease) (HCC)   Essential hypertension   ESRD on dialysis ([place])   Acute encephalopathy   NSTEMI (non-ST elevated myocardial infarction) ([place])   Bacteremia    1. Mildly elevated troponin, (155, 193, 194), in the setting of altered  mental status, fever, tachycardia, and elevated white count consistent with systemic inflammatory response syndrome and possible sepsis,  in patient with known end-stage renal disease on chronic hemodialysis, likely not acute coronary syndrome and more consistent with neck elevation in the setting of ESRD and demand supply ischemia in the setting of SIRS 2.  Accelerated junctional rhythm 105 bpm, currently in sinus rhythm at 76 bpm 3.  SIRS, in patient presenting with altered mental status, fever, elevated white count, and tachycardia 4.  Altered mental status 5 . End-stage renal disease on chronic hemodialysis 6.  COPD with hypoxia and likely acute exacerbation   Recommendations   1.  Agree with current therapy 2.  Defer full dose anticoagulation 3.  Review 2D echocardiogram 4.  Further recommendations pending 2D echocardiogram results   Isaias Cowman, MD, PhD, [facility] 06/20/2022 1:55 PM

## 2022-06-20 NOTE — Progress Notes (Signed)
NSG [id number]: Pt to HD at approx 0730.  Arrived back to A2 at approx 1150.  Tolerated HD well, 2L removed per HD RN.  Pt confused, legally blind.  Drowsy, but arousable to voice.  Explained to pt that the plan was to have the HD cath removed today.  This RN rcd a call from Carey Bullocks, the director with the dept of social services.  Her cell # is [phone] if needed throughout the weekend.  She told this RN that the pt is a "ward of the state" and that she is the one who will give consent for the HD removal.  Consent obtained, verified with second RN.  HD  cath removed at approx 1600 by Valetta Fuller, MD at the bedside.  This RN at the bedside at that time.  After cath removed, dsg to right chest wall applied by MD.  At last check, 1800, site benign.  Pt not impulsive, not attempting to get OOB.  No urine output noted.  Pt ate breakfast in HD and refused lunch and dinner today.  Tele order Dc'd.  CCMD notified and monitor removed from the pt.  Bed alarm on.  Will continue to monitor.

## 2022-06-20 NOTE — Progress Notes (Signed)
Pt ate breakfast w/ assistance from [facility]. Blind and total feed.

## 2022-06-20 NOTE — Progress Notes (Addendum)
Daily Progress Note   Removal of tunneled dialysis catheter requested by Dr. Candiss Norse.  Pt is a hemodialysis currently.  Will see if pt available when I am next in the hospital for procedure.  Reviewing this pt's chart, RIJ TDC may have been present for >1 year which raises risk of inability to remove.  - Lidocaine ordered to bedside    Adele Barthel, MD, FACS, FSVS Covering for [facility]: [phone]  06/20/2022, 11:24 AM   Addendum  Tunneled dialysis catheter removed.  Suspect LEFT AVG should be ready for use at this point.  Adele Barthel, MD, FACS, FSVS Covering for [facility]: [phone]  06/20/2022, 4:39 PM

## 2022-06-20 NOTE — Procedures (Signed)
PROCEDURE: tunneled dialysis catheter removal  INDICATIONS: bacteremia from presumed tunneled dialysis catheter   CONSENT: on chart from legal guardian  DESCRIPTION:  Time out was completed per protocol with nurse in room.  Exit site of tunneled dialysis catheter was prepped with Betadine.  I injected 8 cc of 1% lidocaine with epinephrine around the palpable dialysis catheter cuff and along the subcutaneous tissue leading to the cuff.  I bluntly dissected from the exit site to the cuff.  I bluntly lysed the tissue adherent to the cuff to release the tunneled dialysis catheter.  The tunneled dialysis catheter was removed while holding pressure to the RIGHT internal jugular vein for 5 minutes.  A sterile bandage was affixed to the exit site.  The tip of the catheter was cut off and sent as a specimen.  SPECIMEN: tunneled dialysis catheter tip  COMPLICATIONS: none  CONDITION: stable  Adele Barthel, MD, FACS, FSVS Covering for [facility]: [phone]  06/20/2022, 4:37 PM

## 2022-06-20 NOTE — Progress Notes (Signed)
Todd Norse, MD present. Increased patient goal to 2L. Pt alert, eating breakfast w/ assistance from [facility]. Continue to monitor.

## 2022-06-20 NOTE — Progress Notes (Signed)
Increased patient uf goal to 2073ml per md instruction, patient is alert and eating breakfast at this time.

## 2022-06-20 NOTE — Progress Notes (Signed)
Tx initiated, experienced blood pump alarms and high arterial pressures upon starting treatment. Patients blood lines were reversed and the blood pump was reduced to 350 to resolve the errors. Patient is alert and asking for breakfast. Will continue to monitor the patient and treat as prescribed.

## 2022-06-21 DIAGNOSIS — R6510 Systemic inflammatory response syndrome (SIRS) of non-infectious origin without acute organ dysfunction: Secondary | ICD-10-CM | POA: Diagnosis not present

## 2022-06-21 LAB — ECHOCARDIOGRAM COMPLETE
AR max vel: 4.01 cm2
AV Area VTI: 4.1 cm2
AV Area mean vel: 3.77 cm2
AV Mean grad: 9 mmHg
AV Peak grad: 16.2 mmHg
Ao pk vel: 2.01 m/s
Area-P 1/2: 3.48 cm2
Calc EF: 65.8 %
P 1/2 time: 508 msec
S' Lateral: 3.5 cm
Single Plane A2C EF: 58.4 %
Single Plane A4C EF: 68.9 %
Weight: 2673.74 oz

## 2022-06-21 LAB — BASIC METABOLIC PANEL
Anion gap: 12 (ref 5–15)
BUN: 47 mg/dL — ABNORMAL HIGH (ref 8–23)
CO2: 25 mmol/L (ref 22–32)
Calcium: 9 mg/dL (ref 8.9–10.3)
Chloride: 97 mmol/L — ABNORMAL LOW (ref 98–111)
Creatinine, Ser: 5.96 mg/dL — ABNORMAL HIGH (ref 0.61–1.24)
GFR, Estimated: 10 mL/min — ABNORMAL LOW (ref >60)
Glucose, Bld: 80 mg/dL (ref 70–99)
Potassium: 4.8 mmol/L (ref 3.5–5.1)
Sodium: 134 mmol/L — ABNORMAL LOW (ref 135–145)

## 2022-06-21 LAB — HEPATITIS B SURFACE ANTIBODY, QUANTITATIVE: Hep B S AB Quant (Post): 60 m[IU]/mL (ref >9.9)

## 2022-06-21 LAB — CBC
HCT: 24.4 % — ABNORMAL LOW (ref 39.0–52.0)
Hemoglobin: 8.1 g/dL — ABNORMAL LOW (ref 13.0–17.0)
MCH: 34.5 pg — ABNORMAL HIGH (ref 26.0–34.0)
MCHC: 33.2 g/dL (ref 30.0–36.0)
MCV: 103.8 fL — ABNORMAL HIGH (ref 80.0–100.0)
Platelets: 128 10*3/uL — ABNORMAL LOW (ref 150–400)
RBC: 2.35 MIL/uL — ABNORMAL LOW (ref 4.22–5.81)
RDW: 17.1 % — ABNORMAL HIGH (ref 11.5–15.5)
WBC: 10.8 10*3/uL — ABNORMAL HIGH (ref 4.0–10.5)
nRBC: 0 % (ref 0.0–0.2)

## 2022-06-21 LAB — MAGNESIUM: Magnesium: 1.9 mg/dL (ref 1.7–2.4)

## 2022-06-21 MED ORDER — CARBAMAZEPINE 200 MG PO TABS
1000.0000 mg | ORAL_TABLET | Freq: Every morning | ORAL | Status: DC
  Administered 2022-06-22 – 2022-06-30 (×8): 1000 mg via ORAL
  Filled 2022-06-21 (×8): qty 5

## 2022-06-21 MED ORDER — CALCIUM ACETATE (PHOS BINDER) 667 MG PO CAPS
1334.0000 mg | ORAL_CAPSULE | Freq: Three times a day (TID) | ORAL | Status: DC
  Administered 2022-06-21 – 2022-06-30 (×22): 1334 mg via ORAL
  Filled 2022-06-21 (×24): qty 2

## 2022-06-21 MED ORDER — DOXAZOSIN MESYLATE 4 MG PO TABS
4.0000 mg | ORAL_TABLET | Freq: Every day | ORAL | Status: DC
  Administered 2022-06-21 – 2022-06-29 (×8): 4 mg via ORAL
  Filled 2022-06-21 (×10): qty 1

## 2022-06-21 MED ORDER — AMLODIPINE BESYLATE 10 MG PO TABS
10.0000 mg | ORAL_TABLET | Freq: Every morning | ORAL | Status: DC
  Administered 2022-06-21 – 2022-06-29 (×8): 10 mg via ORAL
  Filled 2022-06-21 (×9): qty 1

## 2022-06-21 MED ORDER — IPRATROPIUM-ALBUTEROL 0.5-2.5 (3) MG/3ML IN SOLN
3.0000 mL | Freq: Four times a day (QID) | RESPIRATORY_TRACT | Status: DC | PRN
  Administered 2022-06-21: 3 mL via RESPIRATORY_TRACT
  Filled 2022-06-21: qty 3

## 2022-06-21 MED ORDER — ISOSORBIDE MONONITRATE ER 30 MG PO TB24
60.0000 mg | ORAL_TABLET | Freq: Two times a day (BID) | ORAL | Status: DC
  Administered 2022-06-21 – 2022-06-30 (×16): 60 mg via ORAL
  Filled 2022-06-21 (×5): qty 2
  Filled 2022-06-21: qty 1
  Filled 2022-06-21: qty 2
  Filled 2022-06-21: qty 1
  Filled 2022-06-21 (×2): qty 2
  Filled 2022-06-21: qty 1
  Filled 2022-06-21 (×2): qty 2
  Filled 2022-06-21: qty 1
  Filled 2022-06-21: qty 2
  Filled 2022-06-21: qty 1
  Filled 2022-06-21: qty 2

## 2022-06-21 MED ORDER — HYDRALAZINE HCL 50 MG PO TABS
100.0000 mg | ORAL_TABLET | Freq: Three times a day (TID) | ORAL | Status: DC
  Administered 2022-06-21 – 2022-06-29 (×23): 100 mg via ORAL
  Filled 2022-06-21 (×24): qty 2

## 2022-06-21 MED ORDER — MINOXIDIL 2.5 MG PO TABS
2.5000 mg | ORAL_TABLET | Freq: Every day | ORAL | Status: DC
  Administered 2022-06-21 – 2022-06-29 (×8): 2.5 mg via ORAL
  Filled 2022-06-21 (×10): qty 1

## 2022-06-21 MED ORDER — GUAIFENESIN-DM 100-10 MG/5ML PO SYRP
10.0000 mL | ORAL_SOLUTION | Freq: Four times a day (QID) | ORAL | Status: DC | PRN
  Administered 2022-06-21 – 2022-06-22 (×3): 10 mL via ORAL
  Filled 2022-06-21 (×3): qty 10

## 2022-06-21 MED ORDER — PANTOPRAZOLE SODIUM 40 MG PO TBEC
40.0000 mg | DELAYED_RELEASE_TABLET | Freq: Every day | ORAL | Status: DC
  Administered 2022-06-21 – 2022-06-30 (×7): 40 mg via ORAL
  Filled 2022-06-21 (×8): qty 1

## 2022-06-21 MED ORDER — FLUPHENAZINE HCL 5 MG PO TABS
2.5000 mg | ORAL_TABLET | Freq: Every day | ORAL | Status: DC
  Administered 2022-06-22 – 2022-06-30 (×8): 2.5 mg via ORAL
  Filled 2022-06-21 (×12): qty 1

## 2022-06-21 MED ORDER — IPRATROPIUM-ALBUTEROL 0.5-2.5 (3) MG/3ML IN SOLN
3.0000 mL | Freq: Three times a day (TID) | RESPIRATORY_TRACT | Status: DC
  Administered 2022-06-21: 3 mL via RESPIRATORY_TRACT
  Filled 2022-06-21: qty 3

## 2022-06-21 MED ORDER — TRIHEXYPHENIDYL HCL 2 MG PO TABS
2.0000 mg | ORAL_TABLET | Freq: Two times a day (BID) | ORAL | Status: DC
  Administered 2022-06-21 – 2022-06-30 (×16): 2 mg via ORAL
  Filled 2022-06-21 (×20): qty 1

## 2022-06-21 NOTE — Progress Notes (Addendum)
[facility]  ROUNDING NOTE   Subjective:   Todd Brown is a 62 year old male with past medical conditions including COPD, hypertension, DVT, schizophrenia, and end-stage renal disease on hemodialysis.  Patient presents to the emergency department with somnolence. Patient has been admitted for ESRD on dialysis ([place]) [N18.6, Z99.2] Acute encephalopathy [G93.40] Altered mental status, unspecified altered mental status type [R41.82] Sepsis with encephalopathy without septic shock, due to unspecified organism ([place]) [A41.9, R65.20, G93.41] Bacteremia [R78.81]  Patient is known to our practice and receives outpatient dialysis treatments at [facility] on a MWF schedule, supervised by Dr. Holley Raring.     Patient currently eating breakfast with the assistance of a NA  Perm cath removed yesterday       Objective:  Vital signs in last 24 hours:  Temp:  [97.8 F (36.6 C)-99.3 F (37.4 C)] 99.3 F (37.4 C) (01/21 0811) Pulse Rate:  [58-80] 58 (01/21 0811) Resp:  [16-20] 18 (01/21 0811) BP: (131-173)/(81-91) 173/86 (01/21 0811) SpO2:  [96 %-100 %] 96 % (01/21 0906) Weight:  [75.8 kg] 75.8 kg (01/20 1138)  Weight change: -2.4 kg Filed Weights   06/20/22 0622 06/20/22 0731 06/20/22 1138  Weight: 76.1 kg 76.8 kg 75.8 kg    Intake/Output: I/O last 3 completed shifts: In: 360 [P.O.:360] Out: 2 [Other:2]   Intake/Output this shift:  Total I/O In: 120 [P.O.:120] Out: 0   Physical Exam: General: NAD, sitting up in bed   Head: Normocephalic, atraumatic. Moist oral mucosal membranes  Eyes: Anicteric  Lungs:  Clear to auscultation  Heart: Regular rate and rhythm  Abdomen:  Soft, tender, mild distension  Extremities:  Trace peripheral edema.  Neurologic: Nonfocal, moving all four extremities  Skin: No lesions  Access: Rt Permcath    Basic Metabolic Panel: Recent Labs  Lab 06/19/22 1039 06/20/22 1120 06/21/22 0649  NA 135 134* 134*  K 4.6 3.7 4.8  CL 94*  95* 97*  CO2 26 28 25   GLUCOSE 81 104* 80  BUN 48* 30* 47*  CREATININE 6.50* 3.61* 5.96*  CALCIUM 9.7 9.1 9.0  MG  --   --  1.9     Liver Function Tests: Recent Labs  Lab 06/19/22 1039 06/20/22 1120  AST 37 47*  ALT 25 38  ALKPHOS 98 88  BILITOT 1.0 0.8  PROT 7.3 6.6  ALBUMIN 4.0 3.5    No results for input(s): "LIPASE", "AMYLASE" in the last 168 hours. No results for input(s): "AMMONIA" in the last 168 hours.  CBC: Recent Labs  Lab 06/19/22 1039 06/20/22 1120 06/21/22 0649  WBC 14.3* 13.7* 10.8*  NEUTROABS 12.8* 12.2*  --   HGB 9.8* 8.9* 8.1*  HCT 30.5* 26.7* 24.4*  MCV 106.6* 102.3* 103.8*  PLT 163 154 128*     Cardiac Enzymes: No results for input(s): "CKTOTAL", "CKMB", "CKMBINDEX", "TROPONINI" in the last 168 hours.  BNP: Invalid input(s): "POCBNP"  CBG: No results for input(s): "GLUCAP" in the last 168 hours.  Microbiology: Results for orders placed or performed during the hospital encounter of 06/19/22  Resp panel by RT-PCR (RSV, Flu A&B, Covid) Anterior Nasal Swab     Status: None   Collection Time: 06/19/22 10:31 AM   Specimen: Anterior Nasal Swab  Result Value Ref Range Status   SARS Coronavirus 2 by RT PCR NEGATIVE NEGATIVE Final    Comment: (NOTE) SARS-CoV-2 target nucleic acids are NOT DETECTED.  The SARS-CoV-2 RNA is generally detectable in upper respiratory specimens during the acute phase of  infection. The lowest concentration of SARS-CoV-2 viral copies this assay can detect is 138 copies/mL. A negative result does not preclude SARS-Cov-2 infection and should not be used as the sole basis for treatment or other patient management decisions. A negative result may occur with  improper specimen collection/handling, submission of specimen other than nasopharyngeal swab, presence of viral mutation(s) within the areas targeted by this assay, and inadequate number of viral copies(<138 copies/mL). A negative result must be combined  with clinical observations, patient history, and epidemiological information. The expected result is Negative.  Fact Sheet for Patients:  EntrepreneurPulse.com.au  Fact Sheet for Healthcare Providers:  IncredibleEmployment.be  This test is no t yet approved or cleared by the [place] FDA and  has been authorized for detection and/or diagnosis of SARS-CoV-2 by FDA under an Emergency Use Authorization (EUA). This EUA will remain  in effect (meaning this test can be used) for the duration of the COVID-19 declaration under Section 564(b)(1) of the Act, 21 U.S.C.section 360bbb-3(b)(1), unless the authorization is terminated  or revoked sooner.       Influenza A by PCR NEGATIVE NEGATIVE Final   Influenza B by PCR NEGATIVE NEGATIVE Final    Comment: (NOTE) The Xpert Xpress SARS-CoV-2/FLU/RSV plus assay is intended as an aid in the diagnosis of influenza from Nasopharyngeal swab specimens and should not be used as a sole basis for treatment. Nasal washings and aspirates are unacceptable for Xpert Xpress SARS-CoV-2/FLU/RSV testing.  Fact Sheet for Patients: EntrepreneurPulse.com.au  Fact Sheet for Healthcare Providers: IncredibleEmployment.be  This test is not yet approved or cleared by the [place] FDA and has been authorized for detection and/or diagnosis of SARS-CoV-2 by FDA under an Emergency Use Authorization (EUA). This EUA will remain in effect (meaning this test can be used) for the duration of the COVID-19 declaration under Section 564(b)(1) of the Act, 21 U.S.C. section 360bbb-3(b)(1), unless the authorization is terminated or revoked.     Resp Syncytial Virus by PCR NEGATIVE NEGATIVE Final    Comment: (NOTE) Fact Sheet for Patients: EntrepreneurPulse.com.au  Fact Sheet for Healthcare Providers: IncredibleEmployment.be  This test is not yet approved  or cleared by the [place] FDA and has been authorized for detection and/or diagnosis of SARS-CoV-2 by FDA under an Emergency Use Authorization (EUA). This EUA will remain in effect (meaning this test can be used) for the duration of the COVID-19 declaration under Section 564(b)(1) of the Act, 21 U.S.C. section 360bbb-3(b)(1), unless the authorization is terminated or revoked.  Performed at [facility], [address] [place], [place] [address]   Blood Culture (routine x 2)     Status: None (Preliminary result)   Collection Time: 06/19/22 10:39 AM   Specimen: BLOOD  Result Value Ref Range Status   Specimen Description   Final    BLOOD RIGHT [facility] Performed at [facility], [address] [place], [place] [address]    Special Requests   Final    BOTTLES DRAWN AEROBIC AND ANAEROBIC Blood Culture adequate volume Performed at [facility], [address] [place], [place] [address]    Culture  Setup Time   Final    Organism ID to follow IN BOTH AEROBIC AND ANAEROBIC BOTTLES GRAM POSITIVE COCCI CRITICAL RESULT CALLED TO, READ BACK BY AND VERIFIED WITH: JASON ROBBINS 06/19/2022 AT 2147 SRR    Culture   Final    GRAM POSITIVE COCCI IDENTIFICATION AND SUSCEPTIBILITIES TO FOLLOW Performed at [facility], [place]  [address] [place], [place] [address]    Report Status PENDING  Incomplete  Blood Culture ID Panel (Reflexed)     Status: Abnormal   Collection Time: 06/19/22 10:39 AM  Result Value Ref Range Status   Enterococcus faecalis NOT DETECTED NOT DETECTED Final   Enterococcus Faecium NOT DETECTED NOT DETECTED Final   Listeria monocytogenes NOT DETECTED NOT DETECTED Final   Staphylococcus species NOT DETECTED NOT DETECTED Final   Staphylococcus aureus (BCID) NOT DETECTED NOT DETECTED Final   Staphylococcus epidermidis NOT DETECTED NOT DETECTED Final   Staphylococcus lugdunensis NOT DETECTED NOT DETECTED Final   Streptococcus species DETECTED  (A) NOT DETECTED Final    Comment: Not Enterococcus species, Streptococcus agalactiae, Streptococcus pyogenes, or Streptococcus pneumoniae. CRITICAL RESULT CALLED TO, READ BACK BY AND VERIFIED WITH: JASON ROBBINS 06/19/2022 AT 2147 SRR    Streptococcus agalactiae NOT DETECTED NOT DETECTED Final   Streptococcus pneumoniae NOT DETECTED NOT DETECTED Final   Streptococcus pyogenes NOT DETECTED NOT DETECTED Final   A.calcoaceticus-baumannii NOT DETECTED NOT DETECTED Final   Bacteroides fragilis NOT DETECTED NOT DETECTED Final   Enterobacterales NOT DETECTED NOT DETECTED Final   Enterobacter cloacae complex NOT DETECTED NOT DETECTED Final   Escherichia coli NOT DETECTED NOT DETECTED Final   Klebsiella aerogenes NOT DETECTED NOT DETECTED Final   Klebsiella oxytoca NOT DETECTED NOT DETECTED Final   Klebsiella pneumoniae NOT DETECTED NOT DETECTED Final   Proteus species NOT DETECTED NOT DETECTED Final   Salmonella species NOT DETECTED NOT DETECTED Final   Serratia marcescens NOT DETECTED NOT DETECTED Final   Haemophilus influenzae NOT DETECTED NOT DETECTED Final   Neisseria meningitidis NOT DETECTED NOT DETECTED Final   Pseudomonas aeruginosa NOT DETECTED NOT DETECTED Final   Stenotrophomonas maltophilia NOT DETECTED NOT DETECTED Final   Candida albicans NOT DETECTED NOT DETECTED Final   Candida auris NOT DETECTED NOT DETECTED Final   Candida glabrata NOT DETECTED NOT DETECTED Final   Candida krusei NOT DETECTED NOT DETECTED Final   Candida parapsilosis NOT DETECTED NOT DETECTED Final   Candida tropicalis NOT DETECTED NOT DETECTED Final   Cryptococcus neoformans/gattii NOT DETECTED NOT DETECTED Final    Comment: Performed at [facility], [address] [place], [place] [address]  Blood Culture (routine x 2)     Status: None (Preliminary result)   Collection Time: 06/19/22 10:49 AM   Specimen: BLOOD  Result Value Ref Range Status   Specimen Description   Final    BLOOD RIGHT  [facility] Performed at [facility], [address] [place], [place] [address]    Special Requests   Final    BOTTLES DRAWN AEROBIC AND ANAEROBIC Blood Culture adequate volume Performed at [facility], [address] [place], [place] [address]    Culture  Setup Time   Final    IN BOTH AEROBIC AND ANAEROBIC BOTTLES GRAM POSITIVE COCCI CRITICAL RESULT CALLED TO, READ BACK BY AND VERIFIED WITH: JASON ROBBINS 06/19/2022 AT 2147 SRR    Culture   Final    GRAM POSITIVE COCCI IDENTIFICATION TO FOLLOW Performed at [facility], [place] [address], [place], [place] [address]    Report Status PENDING  Incomplete  Culture, blood (Routine X 2) w Reflex to ID Panel     Status: None (Preliminary result)   Collection Time: 06/19/22  7:40 PM   Specimen: BLOOD  Result Value Ref Range Status   Specimen Description BLOOD BLOOD RIGHT HAND  Final   Special Requests   Final  BOTTLES DRAWN AEROBIC ONLY Blood Culture results may not be optimal due to an inadequate volume of blood received in culture bottles   Culture   Final    NO GROWTH 2 DAYS Performed at [facility], [address] [place], [place] [address]    Report Status PENDING  Incomplete  Culture, blood (Routine X 2) w Reflex to ID Panel     Status: None (Preliminary result)   Collection Time: 06/19/22  7:40 PM   Specimen: BLOOD  Result Value Ref Range Status   Specimen Description BLOOD THUMB RIGHT  Final   Special Requests IN PEDIATRIC BOTTLE Blood Culture adequate volume  Final   Culture   Final    NO GROWTH 2 DAYS Performed at [facility], [address], [place], [place] [address]    Report Status PENDING  Incomplete  Cath Tip Culture     Status: None (Preliminary result)   Collection Time: 06/20/22 11:24 AM   Specimen: Catheter Tip; Other  Result Value Ref Range Status   Specimen Description   Final    CATH TIP Performed at [facility], [address], [place], [place]  [address]    Special Requests   Final    NONE Performed at [facility], [address] [place], [place] [address]    Culture   Final    NO GROWTH < 24 HOURS Performed at [facility], [place] [address], [place],  [address]    Report Status PENDING  Incomplete    Coagulation Studies: Recent Labs    06/19/22 1039  LABPROT 14.6  INR 1.2     Urinalysis: No results for input(s): "COLORURINE", "LABSPEC", "PHURINE", "GLUCOSEU", "HGBUR", "BILIRUBINUR", "KETONESUR", "PROTEINUR", "UROBILINOGEN", "NITRITE", "LEUKOCYTESUR" in the last 72 hours.  Invalid input(s): "APPERANCEUR"    Imaging: ECHOCARDIOGRAM COMPLETE  Result Date: 06/21/2022    ECHOCARDIOGRAM REPORT   Patient Name:   TRAQUAN DUARTE Date of Exam: 06/20/2022 Medical Rec #:  [id number]        Height:       71.0 in Accession #:    [id number]       Weight:       169.3 lb Date of Birth:  02/01/1961        BSA:          1.964 m Patient Age:    69 years         BP:           158/98 mmHg Patient Gender: M                HR:           72 bpm. Exam Location:  [facility] Procedure: 2D Echo, Color Doppler and Cardiac Doppler Indications:     NSTEMI  History:         Patient has prior history of Echocardiogram examinations. COPD;                  Risk Factors:Hypertension and ESRD, HLD.  Sonographer:     L. Thornton-Maynard Referring Phys:  [id number] Jose Persia Diagnosing Phys: Isaias Cowman MD IMPRESSIONS  1. Left ventricular ejection fraction, by estimation, is 60 to 65%. The left ventricle has normal function. The left ventricle has no regional wall motion abnormalities. There is mild left ventricular hypertrophy. Left ventricular diastolic parameters are consistent with Grade I diastolic dysfunction (impaired relaxation).  2. Right ventricular systolic function is normal. The right ventricular size is  normal. There is severely elevated pulmonary artery systolic pressure.  3. The mitral valve is normal in structure. No  evidence of mitral valve regurgitation. No evidence of mitral stenosis.  4. Tricuspid valve regurgitation is moderate to severe.  5. The aortic valve is normal in structure. Aortic valve regurgitation is mild to moderate. No aortic stenosis is present.  6. The inferior vena cava is normal in size with greater than 50% respiratory variability, suggesting right atrial pressure of 3 mmHg. FINDINGS  Left Ventricle: Left ventricular ejection fraction, by estimation, is 60 to 65%. The left ventricle has normal function. The left ventricle has no regional wall motion abnormalities. The left ventricular internal cavity size was normal in size. There is  mild left ventricular hypertrophy. Left ventricular diastolic parameters are consistent with Grade I diastolic dysfunction (impaired relaxation). Right Ventricle: The right ventricular size is normal. No increase in right ventricular wall thickness. Right ventricular systolic function is normal. There is severely elevated pulmonary artery systolic pressure. The tricuspid regurgitant velocity is 4.64 m/s, and with an assumed right atrial pressure of 15 mmHg, the estimated right ventricular systolic pressure is 295.6 mmHg. Left Atrium: Left atrial size was normal in size. Right Atrium: Right atrial size was normal in size. Pericardium: Trivial pericardial effusion is present. Mitral Valve: The mitral valve is normal in structure. No evidence of mitral valve regurgitation. No evidence of mitral valve stenosis. Tricuspid Valve: The tricuspid valve is normal in structure. Tricuspid valve regurgitation is moderate to severe. No evidence of tricuspid stenosis. Aortic Valve: The aortic valve is normal in structure. Aortic valve regurgitation is mild to moderate. Aortic regurgitation PHT measures 508 msec. No aortic stenosis is present. Aortic valve mean gradient measures 9.0 mmHg. Aortic valve peak gradient measures 16.2 mmHg. Aortic valve area, by VTI measures 4.10 cm. Pulmonic  Valve: The pulmonic valve was normal in structure. Pulmonic valve regurgitation is not visualized. No evidence of pulmonic stenosis. Aorta: The aortic root is normal in size and structure. Venous: The inferior vena cava is normal in size with greater than 50% respiratory variability, suggesting right atrial pressure of 3 mmHg. IAS/Shunts: No atrial level shunt detected by color flow Doppler.  LEFT VENTRICLE PLAX 2D LVIDd:         5.80 cm      Diastology LVIDs:         3.50 cm      LV e' medial:    8.59 cm/s LV PW:         1.30 cm      LV E/e' medial:  14.0 LV IVS:        1.30 cm      LV e' lateral:   9.46 cm/s LVOT diam:     2.60 cm      LV E/e' lateral: 12.7 LV SV:         148 LV SV Index:   75 LVOT Area:     5.31 cm  LV Volumes (MOD) LV vol d, MOD A2C: 118.0 ml LV vol d, MOD A4C: 157.0 ml LV vol s, MOD A2C: 49.1 ml LV vol s, MOD A4C: 48.8 ml LV SV MOD A2C:     68.9 ml LV SV MOD A4C:     157.0 ml LV SV MOD BP:      93.0 ml RIGHT VENTRICLE             IVC RV Basal diam:  5.20 cm     IVC diam: 3.50 cm  RV S prime:     16.90 cm/s TAPSE (M-mode): 2.4 cm LEFT ATRIUM             Index        RIGHT ATRIUM           Index LA diam:        3.80 cm 1.93 cm/m   RA Area:     31.30 cm LA Vol (A2C):   95.9 ml 48.82 ml/m  RA Volume:   131.00 ml 66.69 ml/m LA Vol (A4C):   88.1 ml 44.85 ml/m LA Biplane Vol: 91.6 ml 46.63 ml/m  AORTIC VALVE                     PULMONIC VALVE AV Area (Vmax):    4.01 cm      PV Vmax:          1.02 m/s AV Area (Vmean):   3.77 cm      PV Peak grad:     4.2 mmHg AV Area (VTI):     4.10 cm      PR End Diast Vel: 14.90 msec AV Vmax:           201.00 cm/s AV Vmean:          137.000 cm/s AV VTI:            0.361 m AV Peak Grad:      16.2 mmHg AV Mean Grad:      9.0 mmHg LVOT Vmax:         152.00 cm/s LVOT Vmean:        97.200 cm/s LVOT VTI:          0.279 m LVOT/AV VTI ratio: 0.77 AI PHT:            508 msec  AORTA Ao Root diam: 4.15 cm Ao Asc diam:  3.60 cm MITRAL VALVE                TRICUSPID  VALVE MV Area (PHT): 3.48 cm     TR Peak grad:   86.1 mmHg MV Decel Time: 218 msec     TR Vmax:        464.00 cm/s MV E velocity: 120.00 cm/s MV A velocity: 126.00 cm/s  SHUNTS MV E/A ratio:  0.95         Systemic VTI:  0.28 m                             Systemic Diam: 2.60 cm Isaias Cowman MD Electronically signed by Isaias Cowman MD Signature Date/Time: 06/21/2022/10:12:58 AM    Final    CT CHEST WO CONTRAST  Result Date: 06/19/2022 CLINICAL DATA:  Respiratory illness with nondiagnostic x-ray. Congestion, cough, pulmonary edema. EXAM: CT CHEST WITHOUT CONTRAST TECHNIQUE: Multidetector CT imaging of the chest was performed following the standard protocol without IV contrast. RADIATION DOSE REDUCTION: This exam was performed according to the departmental dose-optimization program which includes automated exposure control, adjustment of the mA and/or kV according to patient size and/or use of iterative reconstruction technique. COMPARISON:  Chest radiograph 06/19/2022 CT chest 05/20/2022 FINDINGS: Cardiovascular: Diffuse cardiac enlargement with moderate pericardial effusion. Similar appearance to previous study. Normal caliber thoracic aorta with scattered calcification. Calcification in the coronary arteries. Prominent central pulmonary arteries may indicate evidence of pulmonary arterial hypertension. Mediastinum/Nodes: Right central venous catheter with tip at the cavoatrial junction region. Evaluation of lymph  nodes is limited without IV contrast material but there appear to be scattered prominent lymph nodes in the thoracic inlet, supraclavicular regions, and in the mediastinum. Largest nodes measure about 12 mm short axis dimension. Esophagus is decompressed. Thyroid gland is unremarkable. Lungs/Pleura: Motion artifact limits evaluation. Atelectasis in the lung bases. Diffuse interstitial pattern to the lungs is most likely to represent edema. A 1.6 cm diameter pulmonary nodule is demonstrated  in the superior segment of the right lower lung, series 3, image 71. A left lower lung nodule is demonstrated on image 83, measuring 11 mm diameter. Nodules are unchanged since prior study. Upper Abdomen: Diffuse hepatic enlargement. No focal lesions are demonstrated. Kidneys are somewhat atrophic. Musculoskeletal: Degenerative changes in the spine. No destructive bone lesions. Old healing nondepressed midsternal fracture. IMPRESSION: 1. Diffuse cardiac enlargement with pericardial effusion similar to prior study. 2. Aortic atherosclerosis with coronary artery calcification. 3. Enlarged central pulmonary arteries may indicate pulmonary arterial hypertension. 4. Atelectasis in the lung bases similar prior study. 5. Prominent mediastinal lymph nodes are nonspecific but may be reactive. 6. Bilateral pulmonary nodules as detailed above. Largest measures 1.6 cm in diameter. Similar appearance to previous study. Consider one of the following in 3 months for both low-risk and high-risk individuals: (a) repeat chest CT, (b) follow-up PET-CT, or (c) tissue sampling. This recommendation follows the consensus statement: Guidelines for Management of Incidental Pulmonary Nodules Detected on CT Images: From the Fleischner Society 2017; Radiology 2017; 284:228-243. Electronically Signed   By: Lucienne Capers M.D.   On: 06/19/2022 17:00     Medications:    anticoagulant sodium citrate     cefTRIAXone (ROCEPHIN)  IV 2 g (06/21/22 0536)    amLODipine  10 mg Oral q morning   calcium acetate  1,334 mg Oral TID WC   carbamazepine  1,000 mg Oral q morning   Chlorhexidine Gluconate Cloth  6 each Topical Q0600   doxazosin  4 mg Oral QHS   fluPHENAZine  2.5 mg Oral Daily   heparin  5,000 Units Subcutaneous Q8H   hydrALAZINE  100 mg Oral TID   ipratropium-albuterol  3 mL Nebulization TID   isosorbide mononitrate  60 mg Oral BID   minoxidil  2.5 mg Oral Daily   pantoprazole  40 mg Oral QAC breakfast   sodium chloride  flush  10-40 mL Intracatheter Q12H   sodium chloride flush  3 mL Intravenous Q12H   trihexyphenidyl  2 mg Oral BID   acetaminophen **OR** acetaminophen, alteplase, anticoagulant sodium citrate, heparin, hydrALAZINE, ipratropium-albuterol, lidocaine (PF), lidocaine-prilocaine, pentafluoroprop-tetrafluoroeth, polyethylene glycol  Assessment/ Plan:  [name] is a 62 y.o.  male ith past medical conditions including COPD, hypertension, DVT, schizophrenia, and end-stage renal disease on hemodialysis.  Patient presents to the emergency department with somnolence. Patient has been admitted for ESRD on dialysis ([place]) [N18.6, Z99.2] Acute encephalopathy [G93.40] Altered mental status, unspecified altered mental status type [R41.82] Sepsis with encephalopathy without septic shock, due to unspecified organism ([place]) [A41.9, R65.20, G93.41] Bacteremia [R78.81]  [facility]/MWF/Rt Permcath  End stage renal disease on hemodialysis. Patient received HD yesterday. Streptococcus was detected and vascular removed CVC yesterday. We have been given clearance to cannulate AVG in LUE by vascular.      2. Anemia of chronic kidney disease Lab Results  Component Value Date   HGB 8.1 (L) 06/21/2022   Hgb acceptable at this time, will monitor  3. Secondary Hyperparathyroidism: with outpatient labs: PTH 459, phosphorus 3.4, calcium 9.7 on  06/08/22.   Lab Results  Component Value Date   PTH 194 (H) 05/11/2017   CALCIUM 9.0 06/21/2022   CAION 1.19 03/27/2022   PHOS 3.7 05/20/2022    Prescribed calcium acetate and cholecalciferol outpatient.   4.  Hypertension with chronic kidney disease.  Home regimen includes amlodipine, clonidine, hydralazine, irbesartan, isosorbide, doxazosin and minoxidil. All currently held. Blood pressure acceptable, 173/86   LOS: 2 Nilsa Macht P Axtyn Woehler 1/21/202411:13 AM

## 2022-06-21 NOTE — Progress Notes (Addendum)
[facility]  SUBJECTIVE: Patient laying in bed, denies chest pain or shortness of breath   Vitals:   06/21/22 0042 06/21/22 0419 06/21/22 0811 06/21/22 0906  BP: (!) 157/84 (!) 167/91 (!) 173/86   Pulse: 64 64 (!) 58   Resp: 19 19 18    Temp: 98.7 F (37.1 C) 98.5 F (36.9 C) 99.3 F (37.4 C)   TempSrc:   Oral   SpO2: 100% 100% 100% 96%  Weight:         Intake/Output Summary (Last 24 hours) at 06/21/2022 7096 Last data filed at 06/21/2022 2836 Gross per 24 hour  Intake 120 ml  Output 2 ml  Net 118 ml      PHYSICAL EXAM  General: Well developed, well nourished, in no acute distress HEENT:  Normocephalic and atramatic Neck:  No JVD.  Lungs: Clear bilaterally to auscultation and percussion. Heart: HRRR . Normal S1 and S2 without gallops or murmurs.  Abdomen: Bowel sounds are positive, abdomen soft and non-tender  Msk:  Back normal, normal gait. Normal strength and tone for age. Extremities: No clubbing, cyanosis or edema.   Neuro: Alert and oriented X 3. Psych:  Good affect, responds appropriately   LABS: Basic Metabolic Panel: Recent Labs    06/20/22 1120 06/21/22 0649  NA 134* 134*  K 3.7 4.8  CL 95* 97*  CO2 28 25  GLUCOSE 104* 80  BUN 30* 47*  CREATININE 3.61* 5.96*  CALCIUM 9.1 9.0  MG  --  1.9   Liver Function Tests: Recent Labs    06/19/22 1039 06/20/22 1120  AST 37 47*  ALT 25 38  ALKPHOS 98 88  BILITOT 1.0 0.8  PROT 7.3 6.6  ALBUMIN 4.0 3.5   No results for input(s): "LIPASE", "AMYLASE" in the last 72 hours. CBC: Recent Labs    06/19/22 1039 06/20/22 1120 06/21/22 0649  WBC 14.3* 13.7* 10.8*  NEUTROABS 12.8* 12.2*  --   HGB 9.8* 8.9* 8.1*  HCT 30.5* 26.7* 24.4*  MCV 106.6* 102.3* 103.8*  PLT 163 154 128*   Cardiac Enzymes: No results for input(s): "CKTOTAL", "CKMB", "CKMBINDEX", "TROPONINI" in the last 72 hours. BNP: Invalid input(s): "POCBNP" D-Dimer: No results for input(s): "DDIMER" in the last 72 hours. Hemoglobin  A1C: No results for input(s): "HGBA1C" in the last 72 hours. Fasting Lipid Panel: No results for input(s): "CHOL", "HDL", "LDLCALC", "TRIG", "CHOLHDL", "LDLDIRECT" in the last 72 hours. Thyroid Function Tests: Recent Labs    06/19/22 1039  TSH >50.500*   Anemia Panel: No results for input(s): "VITAMINB12", "FOLATE", "FERRITIN", "TIBC", "IRON", "RETICCTPCT" in the last 72 hours.  CT CHEST WO CONTRAST  Result Date: 06/19/2022 CLINICAL DATA:  Respiratory illness with nondiagnostic x-ray. Congestion, cough, pulmonary edema. EXAM: CT CHEST WITHOUT CONTRAST TECHNIQUE: Multidetector CT imaging of the chest was performed following the standard protocol without IV contrast. RADIATION DOSE REDUCTION: This exam was performed according to the departmental dose-optimization program which includes automated exposure control, adjustment of the mA and/or kV according to patient size and/or use of iterative reconstruction technique. COMPARISON:  Chest radiograph 06/19/2022 CT chest 05/20/2022 FINDINGS: Cardiovascular: Diffuse cardiac enlargement with moderate pericardial effusion. Similar appearance to previous study. Normal caliber thoracic aorta with scattered calcification. Calcification in the coronary arteries. Prominent central pulmonary arteries may indicate evidence of pulmonary arterial hypertension. Mediastinum/Nodes: Right central venous catheter with tip at the cavoatrial junction region. Evaluation of lymph nodes is limited without IV contrast material but there appear to be scattered prominent lymph nodes  in the thoracic inlet, supraclavicular regions, and in the mediastinum. Largest nodes measure about 12 mm short axis dimension. Esophagus is decompressed. Thyroid gland is unremarkable. Lungs/Pleura: Motion artifact limits evaluation. Atelectasis in the lung bases. Diffuse interstitial pattern to the lungs is most likely to represent edema. A 1.6 cm diameter pulmonary nodule is demonstrated in the  superior segment of the right lower lung, series 3, image 71. A left lower lung nodule is demonstrated on image 83, measuring 11 mm diameter. Nodules are unchanged since prior study. Upper Abdomen: Diffuse hepatic enlargement. No focal lesions are demonstrated. Kidneys are somewhat atrophic. Musculoskeletal: Degenerative changes in the spine. No destructive bone lesions. Old healing nondepressed midsternal fracture. IMPRESSION: 1. Diffuse cardiac enlargement with pericardial effusion similar to prior study. 2. Aortic atherosclerosis with coronary artery calcification. 3. Enlarged central pulmonary arteries may indicate pulmonary arterial hypertension. 4. Atelectasis in the lung bases similar prior study. 5. Prominent mediastinal lymph nodes are nonspecific but may be reactive. 6. Bilateral pulmonary nodules as detailed above. Largest measures 1.6 cm in diameter. Similar appearance to previous study. Consider one of the following in 3 months for both low-risk and high-risk individuals: (a) repeat chest CT, (b) follow-up PET-CT, or (c) tissue sampling. This recommendation follows the consensus statement: Guidelines for Management of Incidental Pulmonary Nodules Detected on CT Images: From the Fleischner Society 2017; Radiology 2017; 284:228-243. Electronically Signed   By: Lucienne Capers M.D.   On: 06/19/2022 17:00   DG Chest Port 1 View  Result Date: 06/19/2022 CLINICAL DATA:  Questionable sepsis.  Shortness of breath. EXAM: PORTABLE CHEST 1 VIEW COMPARISON:  05/20/2022 FINDINGS: Right chest wall dialysis catheter noted with tips at the superior cavoatrial junction. Heart size is enlarged. Blunting of the costophrenic angles. Moderate diffuse increase interstitial markings are identified bilaterally. Platelike atelectasis noted in the left base. The visualized osseous structures are unremarkable. IMPRESSION: 1. Pulmonary edema and small pleural effusions. 2. Left base atelectasis. 3. Right chest wall dialysis  catheter tip at the superior cavoatrial junction. Electronically Signed   By: Kerby Moors M.D.   On: 06/19/2022 10:43   CT HEAD WO CONTRAST  Result Date: 06/19/2022 CLINICAL DATA:  Altered mental status. EXAM: CT HEAD WITHOUT CONTRAST TECHNIQUE: Contiguous axial images were obtained from the base of the skull through the vertex without intravenous contrast. RADIATION DOSE REDUCTION: This exam was performed according to the departmental dose-optimization program which includes automated exposure control, adjustment of the mA and/or kV according to patient size and/or use of iterative reconstruction technique. COMPARISON:  05/20/2022 FINDINGS: Brain: No evidence of acute infarction, hemorrhage, hydrocephalus, extra-axial collection or mass lesion/mass effect. There is mild diffuse low-attenuation within the subcortical and periventricular white matter compatible with chronic microvascular disease. Vascular: No hyperdense vessel or unexpected calcification. Skull: Normal. Negative for fracture or focal lesion. Sinuses/Orbits: Similar appearance of left posterior dislocated lens. The paranasal sinuses are clear. Bilateral mastoid air cell effusions. Other: None IMPRESSION: 1. No acute intracranial abnormalities. 2. Mild white matter, small vessel ischemic change. 3. Bilateral mastoid air cell effusions. 4. 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 Electronically Signed   By: Kerby Moors M.D.   On: 06/19/2022 10:35     Echo pending EF 60-65% 06/21/2022  TELEMETRY: Sinus rhythm 76 bpm:  ASSESSMENT AND PLAN:  Principal Problem:   SIRS (systemic inflammatory response syndrome) (HCC) Active Problems:   COPD (chronic obstructive pulmonary disease) (HCC)   Essential hypertension   ESRD on dialysis ([place])   Acute  encephalopathy   NSTEMI (non-ST elevated myocardial infarction) ([place])   Bacteremia    1.  Mildly elevated troponin, (155, 193, 194), in the setting of altered mental  status, fever, tachycardia, and elevated white count consistent with systemic inflammatory response syndrome and possible sepsis, in patient with known end-stage renal disease on chronic hemodialysis, likely not acute coronary syndrome and more consistent with neck elevation in the setting of ESRD and demand supply ischemia in the setting of SIRS.  2D echocardiogram 06/21/2022 revealed LVEF 60-65%. 2.  Accelerated junctional rhythm 105 bpm, resolved, currently in sinus rhythm at 76 bpm 3.  SIRS, in patient presenting with altered mental status, fever, elevated white count, and tachycardia 4.  Altered mental status 5 . End-stage renal disease on chronic hemodialysis 6.  COPD with hypoxia and likely acute exacerbation   Recommendations   1.  Agree with current therapy 2.  Defer full dose anticoagulation 3.  No further cardiac diagnostics at this time 4.  Follow-up with Dr. Clayborn Bigness after discharge  Will sign off for now, please call with any questions   Isaias Cowman, MD, PhD, [facility] 06/21/2022 9:25 AM

## 2022-06-21 NOTE — Progress Notes (Signed)
  PROGRESS NOTE    Todd Brown  UXL:[id number] DOB: 07-24-1960 DOA: 06/19/2022 PCP: [name], NP  [id number]  LOS: 2 days   Brief hospital course:   Assessment & Plan: Todd Brown is a 62 y.o. male with medical history significant of ESRD on HD, COPD with chronic hypoxic respiratory failure on 2 L at baseline, schizophrenia, anemia of chronic disease, hypertension, hyperlipidemia, who presented from group home to the ED due to altered mental status and shortness of breath.    Bacteremia 2/2 Strep Gallolyticus --blood cx 4 of 4 pos, likely contamination from PermCath, which was removed. --Echo showed no vegetation Plan: --cont ceftriaxone  Severe sepsis fever up to 101, WBC of 14 and tachycardia up to 105, with lactic acid 2.9.  source bacteremia.  Troponin elevation 2/2 demand ischemia --trop 190's flat. --cardiology consulted, no cardiac workup needed --Follow-up with Dr. Clayborn Bigness after discharge   Acute metabolic encephalopathy Likely multifactorial in the setting of acute illness.  Patient is protecting his airway at this time.  No hypercarbia on VBG.  Folate and B12 within normal limits recently - Delirium precautions  ESRD on dialysis ([place]) --iHD per nephrology  Essential hypertension --resume home amlodipine, doxazosin, hydralazine, Imdur and minoxidil  COPD (chronic obstructive pulmonary disease) ([place]) Chronic hypoxic respiratory failure on 2 L at baseline --does not appear to be in exacerbation   DVT prophylaxis: Heparin SQ Code Status: Full code  Family Communication:  Level of care: Med-Surg Dispo:   The patient is from: group home Anticipated d/c is to: group home Anticipated d/c date is: >3 days   Subjective and Interval History:  Pt asked for cough medicine.  Said he would eat if he was given food.   Objective: Vitals:   06/21/22 1201 06/21/22 1635 06/21/22 1642 06/21/22 1645  BP: (!) 153/85 (!) 143/88    Pulse: 68 72     Resp: 18 18    Temp: 99.1 F (37.3 C) 98.8 F (37.1 C)    TempSrc: Oral Oral    SpO2: 98% 100% 98% 98%  Weight:        Intake/Output Summary (Last 24 hours) at 06/21/2022 1659 Last data filed at 06/21/2022 1637 Gross per 24 hour  Intake 240 ml  Output 0 ml  Net 240 ml   Filed Weights   06/20/22 0622 06/20/22 0731 06/20/22 1138  Weight: 76.1 kg 76.8 kg 75.8 kg    Examination:   Constitutional: NAD, alert HEENT: conjunctivae and lids normal, EOMI CV: No cyanosis.   RESP: normal respiratory effort Extremities: edema in BLE SKIN: warm, dry   Data Reviewed: I have personally reviewed labs and imaging studies  Time spent: 35 minutes  Enzo Bi, MD [facility] If 7PM-7AM, please contact night-coverage 06/21/2022, 4:59 PM

## 2022-06-22 DIAGNOSIS — R6510 Systemic inflammatory response syndrome (SIRS) of non-infectious origin without acute organ dysfunction: Secondary | ICD-10-CM | POA: Diagnosis not present

## 2022-06-22 LAB — CULTURE, BLOOD (ROUTINE X 2)
Special Requests: ADEQUATE
Special Requests: ADEQUATE

## 2022-06-22 LAB — BASIC METABOLIC PANEL WITH GFR
Anion gap: 13 (ref 5–15)
BUN: 60 mg/dL — ABNORMAL HIGH (ref 8–23)
CO2: 25 mmol/L (ref 22–32)
Calcium: 8.7 mg/dL — ABNORMAL LOW (ref 8.9–10.3)
Chloride: 97 mmol/L — ABNORMAL LOW (ref 98–111)
Creatinine, Ser: 7.11 mg/dL — ABNORMAL HIGH (ref 0.61–1.24)
GFR, Estimated: 8 mL/min — ABNORMAL LOW
Glucose, Bld: 91 mg/dL (ref 70–99)
Potassium: 5.3 mmol/L — ABNORMAL HIGH (ref 3.5–5.1)
Sodium: 135 mmol/L (ref 135–145)

## 2022-06-22 LAB — CBC
HCT: 25.6 % — ABNORMAL LOW (ref 39.0–52.0)
Hemoglobin: 8.6 g/dL — ABNORMAL LOW (ref 13.0–17.0)
MCH: 34.4 pg — ABNORMAL HIGH (ref 26.0–34.0)
MCHC: 33.6 g/dL (ref 30.0–36.0)
MCV: 102.4 fL — ABNORMAL HIGH (ref 80.0–100.0)
Platelets: 143 K/uL — ABNORMAL LOW (ref 150–400)
RBC: 2.5 MIL/uL — ABNORMAL LOW (ref 4.22–5.81)
RDW: 16.5 % — ABNORMAL HIGH (ref 11.5–15.5)
WBC: 9 K/uL (ref 4.0–10.5)
nRBC: 0 % (ref 0.0–0.2)

## 2022-06-22 LAB — PHOSPHORUS: Phosphorus: 5 mg/dL — ABNORMAL HIGH (ref 2.5–4.6)

## 2022-06-22 LAB — MAGNESIUM: Magnesium: 2 mg/dL (ref 1.7–2.4)

## 2022-06-22 MED ORDER — PENTAFLUOROPROP-TETRAFLUOROETH EX AERO
INHALATION_SPRAY | CUTANEOUS | Status: AC
  Filled 2022-06-22: qty 30

## 2022-06-22 NOTE — Progress Notes (Signed)
Pt venous infiltration. Unable to recannulate d/t position of infiltration. Pt safety rinsed back and HD ended. Sherlyn Hay, NP present and aware.

## 2022-06-22 NOTE — Progress Notes (Signed)
Pre hd rn assessment 

## 2022-06-22 NOTE — Progress Notes (Signed)
  PROGRESS NOTE    [name]  [id number]:[id number] DOB: 02-21-1961 DOA: 06/19/2022 PCP: [name], NP  [id number]  LOS: 3 days   Brief hospital course:   Assessment & Plan: Todd Brown is a 62 y.o. male with medical history significant of ESRD on HD, COPD with chronic hypoxic respiratory failure on 2 L at baseline, schizophrenia, anemia of chronic disease, hypertension, hyperlipidemia, who presented from group home to the ED due to altered mental status and shortness of breath.    Bacteremia 2/2 Strep Gallolyticus --blood cx 4 of 4 pos, likely contamination from PermCath, which was removed. --Echo showed no vegetation Plan: --cont ceftriaxone --consult ID  Severe sepsis fever up to 101, WBC of 14 and tachycardia up to 105, with lactic acid 2.9.  source bacteremia.  Troponin elevation 2/2 demand ischemia --trop 190's flat. --cardiology consulted, no cardiac workup needed --Follow-up with Dr. Clayborn Bigness after discharge   Acute metabolic encephalopathy Likely multifactorial in the setting of acute illness.  Patient is protecting his airway at this time.  No hypercarbia on VBG.  Folate and B12 within normal limits recently - Delirium precautions  ESRD on dialysis ([place]) --iHD per nephrology  Essential hypertension --cont home amlodipine, doxazosin, hydralazine, Imdur and minoxidil  COPD (chronic obstructive pulmonary disease) (HCC) Chronic hypoxic respiratory failure on 2 L at baseline --does not appear to be in exacerbation   DVT prophylaxis: Heparin SQ Code Status: Full code  Family Communication:  Level of care: Med-Surg Dispo:   The patient is from: group home Anticipated d/c is to: group home Anticipated d/c date is: >3 days   Subjective and Interval History:  Could not finish HD today due to venous line infiltrated.   Objective: Vitals:   06/22/22 1047 06/22/22 1054 06/22/22 1303 06/22/22 1638  BP: 137/75  131/77 121/76  Pulse: 64  73 68   Resp: 15  16 19   Temp: 98.5 F (36.9 C)  98.8 F (37.1 C) 98.6 F (37 C)  TempSrc: Oral   Oral  SpO2: 96%  96% 99%  Weight:  74.6 kg      Intake/Output Summary (Last 24 hours) at 06/22/2022 1927 Last data filed at 06/22/2022 1700 Gross per 24 hour  Intake 360 ml  Output 200 ml  Net 160 ml   Filed Weights   06/20/22 1138 06/22/22 0859 06/22/22 1054  Weight: 75.8 kg 77.1 kg 74.6 kg    Examination:   Constitutional: NAD CV: No cyanosis.   RESP: normal respiratory effort, on 2L Extremities: mild edema in BLE   Data Reviewed: I have personally reviewed labs and imaging studies  Time spent: 25 minutes  Enzo Bi, MD [facility] If 7PM-7AM, please contact night-coverage 06/22/2022, 7:27 PM

## 2022-06-22 NOTE — Progress Notes (Signed)
[facility]  ROUNDING NOTE   Subjective:   Todd Brown is a 62 year old male with past medical conditions including COPD, hypertension, DVT, schizophrenia, and end-stage renal disease on hemodialysis.  Patient presents to the emergency department with somnolence. Patient has been admitted for ESRD on dialysis ([place]) [N18.6, Z99.2] Acute encephalopathy [G93.40] Altered mental status, unspecified altered mental status type [R41.82] Sepsis with encephalopathy without septic shock, due to unspecified organism ([place]) [A41.9, R65.20, G93.41] Bacteremia [R78.81]  Patient is known to our practice and receives outpatient dialysis treatments at [facility] on a MWF schedule, supervised by Dr. Holley Raring.    Patient seen and evaluated during dialysis   HEMODIALYSIS FLOWSHEET:  Blood Flow Rate (mL/min): 300 mL/min Arterial Pressure (mmHg): -270 mmHg Venous Pressure (mmHg): 190 mmHg TMP (mmHg): 22 mmHg Ultrafiltration Rate (mL/min): 543 mL/min Dialysate Flow Rate (mL/min): 300 ml/min  Denies pain or discomfort Tolerating treatment well    Objective:  Vital signs in last 24 hours:  Temp:  [98.5 F (36.9 C)-99.1 F (37.3 C)] 98.5 F (36.9 C) (01/22 1047) Pulse Rate:  [61-89] 64 (01/22 1047) Resp:  [12-20] 15 (01/22 1047) BP: (132-160)/(74-133) 137/75 (01/22 1047) SpO2:  [91 %-100 %] 96 % (01/22 1047) FiO2 (%):  [28 %] 28 % (01/21 2236) Weight:  [77.1 kg] 77.1 kg (01/22 0859)  Weight change:  Filed Weights   06/20/22 0731 06/20/22 1138 06/22/22 0859  Weight: 76.8 kg 75.8 kg 77.1 kg    Intake/Output: I/O last 3 completed shifts: In: 240 [P.O.:240] Out: 0    Intake/Output this shift:  Total I/O In: -  Out: 200 [Other:200]  Physical Exam: General: NAD  Head: Normocephalic, atraumatic. Moist oral mucosal membranes  Eyes: Anicteric  Lungs:  Clear to auscultation, normal effort, room air  Heart: Regular rate and rhythm  Abdomen:  Soft, tender, mild  distension  Extremities:  Trace peripheral edema.  Neurologic: Nonfocal, moving all four extremities  Skin: No lesions  Access: Rt Permcath    Basic Metabolic Panel: Recent Labs  Lab 06/19/22 1039 06/20/22 1120 06/21/22 0649 06/22/22 0512  NA 135 134* 134* 135  K 4.6 3.7 4.8 5.3*  CL 94* 95* 97* 97*  CO2 26 28 25 25   GLUCOSE 81 104* 80 91  BUN 48* 30* 47* 60*  CREATININE 6.50* 3.61* 5.96* 7.11*  CALCIUM 9.7 9.1 9.0 8.7*  MG  --   --  1.9 2.0     Liver Function Tests: Recent Labs  Lab 06/19/22 1039 06/20/22 1120  AST 37 47*  ALT 25 38  ALKPHOS 98 88  BILITOT 1.0 0.8  PROT 7.3 6.6  ALBUMIN 4.0 3.5    No results for input(s): "LIPASE", "AMYLASE" in the last 168 hours. No results for input(s): "AMMONIA" in the last 168 hours.  CBC: Recent Labs  Lab 06/19/22 1039 06/20/22 1120 06/21/22 0649 06/22/22 0512  WBC 14.3* 13.7* 10.8* 9.0  NEUTROABS 12.8* 12.2*  --   --   HGB 9.8* 8.9* 8.1* 8.6*  HCT 30.5* 26.7* 24.4* 25.6*  MCV 106.6* 102.3* 103.8* 102.4*  PLT 163 154 128* 143*     Cardiac Enzymes: No results for input(s): "CKTOTAL", "CKMB", "CKMBINDEX", "TROPONINI" in the last 168 hours.  BNP: Invalid input(s): "POCBNP"  CBG: No results for input(s): "GLUCAP" in the last 168 hours.  Microbiology: Results for orders placed or performed during the hospital encounter of 06/19/22  Resp panel by RT-PCR (RSV, Flu A&B, Covid) Anterior Nasal Swab     Status:  None   Collection Time: 06/19/22 10:31 AM   Specimen: Anterior Nasal Swab  Result Value Ref Range Status   SARS Coronavirus 2 by RT PCR NEGATIVE NEGATIVE Final    Comment: (NOTE) SARS-CoV-2 target nucleic acids are NOT DETECTED.  The SARS-CoV-2 RNA is generally detectable in upper respiratory specimens during the acute phase of infection. The lowest concentration of SARS-CoV-2 viral copies this assay can detect is 138 copies/mL. A negative result does not preclude SARS-Cov-2 infection and should not  be used as the sole basis for treatment or other patient management decisions. A negative result may occur with  improper specimen collection/handling, submission of specimen other than nasopharyngeal swab, presence of viral mutation(s) within the areas targeted by this assay, and inadequate number of viral copies(<138 copies/mL). A negative result must be combined with clinical observations, patient history, and epidemiological information. The expected result is Negative.  Fact Sheet for Patients:  EntrepreneurPulse.com.au  Fact Sheet for Healthcare Providers:  IncredibleEmployment.be  This test is no t yet approved or cleared by the [place] FDA and  has been authorized for detection and/or diagnosis of SARS-CoV-2 by FDA under an Emergency Use Authorization (EUA). This EUA will remain  in effect (meaning this test can be used) for the duration of the COVID-19 declaration under Section 564(b)(1) of the Act, 21 U.S.C.section 360bbb-3(b)(1), unless the authorization is terminated  or revoked sooner.       Influenza A by PCR NEGATIVE NEGATIVE Final   Influenza B by PCR NEGATIVE NEGATIVE Final    Comment: (NOTE) The Xpert Xpress SARS-CoV-2/FLU/RSV plus assay is intended as an aid in the diagnosis of influenza from Nasopharyngeal swab specimens and should not be used as a sole basis for treatment. Nasal washings and aspirates are unacceptable for Xpert Xpress SARS-CoV-2/FLU/RSV testing.  Fact Sheet for Patients: EntrepreneurPulse.com.au  Fact Sheet for Healthcare Providers: IncredibleEmployment.be  This test is not yet approved or cleared by the [place] FDA and has been authorized for detection and/or diagnosis of SARS-CoV-2 by FDA under an Emergency Use Authorization (EUA). This EUA will remain in effect (meaning this test can be used) for the duration of the COVID-19 declaration under Section  564(b)(1) of the Act, 21 U.S.C. section 360bbb-3(b)(1), unless the authorization is terminated or revoked.     Resp Syncytial Virus by PCR NEGATIVE NEGATIVE Final    Comment: (NOTE) Fact Sheet for Patients: EntrepreneurPulse.com.au  Fact Sheet for Healthcare Providers: IncredibleEmployment.be  This test is not yet approved or cleared by the [place] FDA and has been authorized for detection and/or diagnosis of SARS-CoV-2 by FDA under an Emergency Use Authorization (EUA). This EUA will remain in effect (meaning this test can be used) for the duration of the COVID-19 declaration under Section 564(b)(1) of the Act, 21 U.S.C. section 360bbb-3(b)(1), unless the authorization is terminated or revoked.  Performed at [facility], [address] [place], [place] [address]   Blood Culture (routine x 2)     Status: Abnormal (Preliminary result)   Collection Time: 06/19/22 10:39 AM   Specimen: BLOOD  Result Value Ref Range Status   Specimen Description   Final    BLOOD RIGHT [name] Performed at [facility], [address], [place], [place] [address]    Special Requests   Final    BOTTLES DRAWN AEROBIC AND ANAEROBIC Blood Culture adequate volume Performed at [facility], [address], [place], [place] [address]    Culture  Setup Time   Final  Organism ID to follow IN BOTH AEROBIC AND ANAEROBIC BOTTLES GRAM POSITIVE COCCI CRITICAL RESULT CALLED TO, READ BACK BY AND VERIFIED WITH: JASON ROBBINS 06/19/2022 AT 2147 SRR    Culture (A)  Final    STREPTOCOCCUS GALLOLYTICUS SUSCEPTIBILITIES TO FOLLOW Performed at [facility], [place] [address], [place], [place] [address]    Report Status PENDING  Incomplete  Blood Culture ID Panel (Reflexed)     Status: Abnormal   Collection Time: 06/19/22 10:39 AM  Result Value Ref Range Status   Enterococcus faecalis NOT DETECTED NOT DETECTED Final   Enterococcus Faecium NOT  DETECTED NOT DETECTED Final   Listeria monocytogenes NOT DETECTED NOT DETECTED Final   Staphylococcus species NOT DETECTED NOT DETECTED Final   Staphylococcus aureus (BCID) NOT DETECTED NOT DETECTED Final   Staphylococcus epidermidis NOT DETECTED NOT DETECTED Final   Staphylococcus lugdunensis NOT DETECTED NOT DETECTED Final   Streptococcus species DETECTED (A) NOT DETECTED Final    Comment: Not Enterococcus species, Streptococcus agalactiae, Streptococcus pyogenes, or Streptococcus pneumoniae. CRITICAL RESULT CALLED TO, READ BACK BY AND VERIFIED WITH: JASON ROBBINS 06/19/2022 AT 2147 SRR    Streptococcus agalactiae NOT DETECTED NOT DETECTED Final   Streptococcus pneumoniae NOT DETECTED NOT DETECTED Final   Streptococcus pyogenes NOT DETECTED NOT DETECTED Final   A.calcoaceticus-baumannii NOT DETECTED NOT DETECTED Final   Bacteroides fragilis NOT DETECTED NOT DETECTED Final   Enterobacterales NOT DETECTED NOT DETECTED Final   Enterobacter cloacae complex NOT DETECTED NOT DETECTED Final   Escherichia coli NOT DETECTED NOT DETECTED Final   Klebsiella aerogenes NOT DETECTED NOT DETECTED Final   Klebsiella oxytoca NOT DETECTED NOT DETECTED Final   Klebsiella pneumoniae NOT DETECTED NOT DETECTED Final   Proteus species NOT DETECTED NOT DETECTED Final   Salmonella species NOT DETECTED NOT DETECTED Final   Serratia marcescens NOT DETECTED NOT DETECTED Final   Haemophilus influenzae NOT DETECTED NOT DETECTED Final   Neisseria meningitidis NOT DETECTED NOT DETECTED Final   Pseudomonas aeruginosa NOT DETECTED NOT DETECTED Final   Stenotrophomonas maltophilia NOT DETECTED NOT DETECTED Final   Candida albicans NOT DETECTED NOT DETECTED Final   Candida auris NOT DETECTED NOT DETECTED Final   Candida glabrata NOT DETECTED NOT DETECTED Final   Candida krusei NOT DETECTED NOT DETECTED Final   Candida parapsilosis NOT DETECTED NOT DETECTED Final   Candida tropicalis NOT DETECTED NOT DETECTED Final    Cryptococcus neoformans/gattii NOT DETECTED NOT DETECTED Final    Comment: Performed at [facility], [address] [place], [place] [address]  Blood Culture (routine x 2)     Status: Abnormal (Preliminary result)   Collection Time: 06/19/22 10:49 AM   Specimen: BLOOD  Result Value Ref Range Status   Specimen Description   Final    BLOOD RIGHT [facility] Performed at [facility], [address] [place], [place] [address]    Special Requests   Final    BOTTLES DRAWN AEROBIC AND ANAEROBIC Blood Culture adequate volume Performed at [facility], [address] [place], [place] [address]    Culture  Setup Time   Final    IN BOTH AEROBIC AND ANAEROBIC BOTTLES GRAM POSITIVE COCCI CRITICAL RESULT CALLED TO, READ BACK BY AND VERIFIED WITH: JASON ROBBINS 06/19/2022 AT 2147 SRR    Culture STREPTOCOCCUS GALLOLYTICUS (A)  Final   Report Status PENDING  Incomplete  Culture, blood (Routine X 2) w Reflex to ID Panel     Status: None (Preliminary result)   Collection  Time: 06/19/22  7:40 PM   Specimen: BLOOD  Result Value Ref Range Status   Specimen Description BLOOD BLOOD RIGHT HAND  Final   Special Requests   Final    BOTTLES DRAWN AEROBIC ONLY Blood Culture results may not be optimal due to an inadequate volume of blood received in culture bottles   Culture   Final    NO GROWTH 3 DAYS Performed at [facility], [address], [place], [place] [address]    Report Status PENDING  Incomplete  Culture, blood (Routine X 2) w Reflex to ID Panel     Status: None (Preliminary result)   Collection Time: 06/19/22  7:40 PM   Specimen: BLOOD  Result Value Ref Range Status   Specimen Description BLOOD THUMB RIGHT  Final   Special Requests IN PEDIATRIC BOTTLE Blood Culture adequate volume  Final   Culture   Final    NO GROWTH 3 DAYS Performed at [facility], [address], [place], [place] [address]    Report Status PENDING  Incomplete  Cath Tip  Culture     Status: None (Preliminary result)   Collection Time: 06/20/22 11:24 AM   Specimen: Catheter Tip; Other  Result Value Ref Range Status   Specimen Description   Final    CATH TIP Performed at [facility], [address] [place], [place] [address]    Special Requests   Final    NONE Performed at [facility], [address] [place], [place] [address]    Culture   Final    CULTURE REINCUBATED FOR BETTER GROWTH Performed at [facility], [place] [address], [place], [place] [address]    Report Status PENDING  Incomplete    Coagulation Studies: No results for input(s): "LABPROT", "INR" in the last 72 hours.   Urinalysis: No results for input(s): "COLORURINE", "LABSPEC", "PHURINE", "GLUCOSEU", "HGBUR", "BILIRUBINUR", "KETONESUR", "PROTEINUR", "UROBILINOGEN", "NITRITE", "LEUKOCYTESUR" in the last 72 hours.  Invalid input(s): "APPERANCEUR"    Imaging: ECHOCARDIOGRAM COMPLETE  Result Date: 06/21/2022    ECHOCARDIOGRAM REPORT   Patient Name:   Todd Brown Date of Exam: 06/20/2022 Medical Rec #:  [id number]        Height:       71.0 in Accession #:    [id number]       Weight:       169.3 lb Date of Birth:  [birth year]        BSA:          1.964 m Patient Age:    80 years         BP:           158/98 mmHg Patient Gender: M                HR:           72 bpm. Exam Location:  [facility] Procedure: 2D Echo, Color Doppler and Cardiac Doppler Indications:     NSTEMI  History:         Patient has prior history of Echocardiogram examinations. COPD;                  Risk Factors:Hypertension and ESRD, HLD.  Sonographer:     L. Thornton-Maynard Referring Phys:  [id number] Jose Persia Diagnosing Phys: Isaias Cowman MD IMPRESSIONS  1. Left ventricular ejection fraction, by estimation, is 60 to 65%. The left ventricle has normal function. The left ventricle has no regional wall motion abnormalities. There  is mild left ventricular hypertrophy. Left ventricular diastolic  parameters are consistent with Grade I diastolic dysfunction (impaired relaxation).  2. Right ventricular systolic function is normal. The right ventricular size is normal. There is severely elevated pulmonary artery systolic pressure.  3. The mitral valve is normal in structure. No evidence of mitral valve regurgitation. No evidence of mitral stenosis.  4. Tricuspid valve regurgitation is moderate to severe.  5. The aortic valve is normal in structure. Aortic valve regurgitation is mild to moderate. No aortic stenosis is present.  6. The inferior vena cava is normal in size with greater than 50% respiratory variability, suggesting right atrial pressure of 3 mmHg. FINDINGS  Left Ventricle: Left ventricular ejection fraction, by estimation, is 60 to 65%. The left ventricle has normal function. The left ventricle has no regional wall motion abnormalities. The left ventricular internal cavity size was normal in size. There is  mild left ventricular hypertrophy. Left ventricular diastolic parameters are consistent with Grade I diastolic dysfunction (impaired relaxation). Right Ventricle: The right ventricular size is normal. No increase in right ventricular wall thickness. Right ventricular systolic function is normal. There is severely elevated pulmonary artery systolic pressure. The tricuspid regurgitant velocity is 4.64 m/s, and with an assumed right atrial pressure of 15 mmHg, the estimated right ventricular systolic pressure is 185.6 mmHg. Left Atrium: Left atrial size was normal in size. Right Atrium: Right atrial size was normal in size. Pericardium: Trivial pericardial effusion is present. Mitral Valve: The mitral valve is normal in structure. No evidence of mitral valve regurgitation. No evidence of mitral valve stenosis. Tricuspid Valve: The tricuspid valve is normal in structure. Tricuspid valve regurgitation is moderate to severe. No evidence of tricuspid stenosis. Aortic Valve: The aortic valve is normal in  structure. Aortic valve regurgitation is mild to moderate. Aortic regurgitation PHT measures 508 msec. No aortic stenosis is present. Aortic valve mean gradient measures 9.0 mmHg. Aortic valve peak gradient measures 16.2 mmHg. Aortic valve area, by VTI measures 4.10 cm. Pulmonic Valve: The pulmonic valve was normal in structure. Pulmonic valve regurgitation is not visualized. No evidence of pulmonic stenosis. Aorta: The aortic root is normal in size and structure. Venous: The inferior vena cava is normal in size with greater than 50% respiratory variability, suggesting right atrial pressure of 3 mmHg. IAS/Shunts: No atrial level shunt detected by color flow Doppler.  LEFT VENTRICLE PLAX 2D LVIDd:         5.80 cm      Diastology LVIDs:         3.50 cm      LV e' medial:    8.59 cm/s LV PW:         1.30 cm      LV E/e' medial:  14.0 LV IVS:        1.30 cm      LV e' lateral:   9.46 cm/s LVOT diam:     2.60 cm      LV E/e' lateral: 12.7 LV SV:         148 LV SV Index:   75 LVOT Area:     5.31 cm  LV Volumes (MOD) LV vol d, MOD A2C: 118.0 ml LV vol d, MOD A4C: 157.0 ml LV vol s, MOD A2C: 49.1 ml LV vol s, MOD A4C: 48.8 ml LV SV MOD A2C:     68.9 ml LV SV MOD A4C:     157.0 ml LV SV MOD BP:  93.0 ml RIGHT VENTRICLE             IVC RV Basal diam:  5.20 cm     IVC diam: 3.50 cm RV S prime:     16.90 cm/s TAPSE (M-mode): 2.4 cm LEFT ATRIUM             Index        RIGHT ATRIUM           Index LA diam:        3.80 cm 1.93 cm/m   RA Area:     31.30 cm LA Vol (A2C):   95.9 ml 48.82 ml/m  RA Volume:   131.00 ml 66.69 ml/m LA Vol (A4C):   88.1 ml 44.85 ml/m LA Biplane Vol: 91.6 ml 46.63 ml/m  AORTIC VALVE                     PULMONIC VALVE AV Area (Vmax):    4.01 cm      PV Vmax:          1.02 m/s AV Area (Vmean):   3.77 cm      PV Peak grad:     4.2 mmHg AV Area (VTI):     4.10 cm      PR End Diast Vel: 14.90 msec AV Vmax:           201.00 cm/s AV Vmean:          137.000 cm/s AV VTI:            0.361 m AV Peak  Grad:      16.2 mmHg AV Mean Grad:      9.0 mmHg LVOT Vmax:         152.00 cm/s LVOT Vmean:        97.200 cm/s LVOT VTI:          0.279 m LVOT/AV VTI ratio: 0.77 AI PHT:            508 msec  AORTA Ao Root diam: 4.15 cm Ao Asc diam:  3.60 cm MITRAL VALVE                TRICUSPID VALVE MV Area (PHT): 3.48 cm     TR Peak grad:   86.1 mmHg MV Decel Time: 218 msec     TR Vmax:        464.00 cm/s MV E velocity: 120.00 cm/s MV A velocity: 126.00 cm/s  SHUNTS MV E/A ratio:  0.95         Systemic VTI:  0.28 m                             Systemic Diam: 2.60 cm Isaias Cowman MD Electronically signed by Isaias Cowman MD Signature Date/Time: 06/21/2022/10:12:58 AM    Final      Medications:    anticoagulant sodium citrate     cefTRIAXone (ROCEPHIN)  IV 2 g (06/22/22 0529)    amLODipine  10 mg Oral q morning   calcium acetate  1,334 mg Oral TID WC   carbamazepine  1,000 mg Oral q morning   Chlorhexidine Gluconate Cloth  6 each Topical Q0600   doxazosin  4 mg Oral QHS   fluPHENAZine  2.5 mg Oral Daily   heparin  5,000 Units Subcutaneous Q8H   hydrALAZINE  100 mg Oral TID   isosorbide mononitrate  60 mg Oral BID   minoxidil  2.5 mg Oral Daily   pantoprazole  40 mg Oral QAC breakfast   pentafluoroprop-tetrafluoroeth       sodium chloride flush  10-40 mL Intracatheter Q12H   sodium chloride flush  3 mL Intravenous Q12H   trihexyphenidyl  2 mg Oral BID   acetaminophen **OR** acetaminophen, alteplase, anticoagulant sodium citrate, guaiFENesin-dextromethorphan, heparin, hydrALAZINE, ipratropium-albuterol, lidocaine (PF), lidocaine-prilocaine, pentafluoroprop-tetrafluoroeth, pentafluoroprop-tetrafluoroeth, polyethylene glycol  Assessment/ Plan:  [name] is a 62 y.o.  male ith past medical conditions including COPD, hypertension, DVT, schizophrenia, and end-stage renal disease on hemodialysis.  Patient presents to the emergency department with somnolence. Patient has been admitted for  ESRD on dialysis ([place]) [N18.6, Z99.2] Acute encephalopathy [G93.40] Altered mental status, unspecified altered mental status type [R41.82] Sepsis with encephalopathy without septic shock, due to unspecified organism ([place]) [A41.9, R65.20, G93.41] Bacteremia [R78.81]  [facility]/MWF/Rt Permcath  End stage renal disease on hemodialysis. Patient received HD yesterday. Streptococcus was detected and vascular removed CVC yesterday.  Patient received dialysis today, experienced access infiltration 1 hour into treatment.  Will allow access to rest with ice.  Patient received next treatment tomorrow.  2. Anemia of chronic kidney disease Lab Results  Component Value Date   HGB 8.6 (L) 06/22/2022  Hemoglobin just below desired target.  Will monitor for now.  Patient receives Mircera in outpatient clinic.  3. Secondary Hyperparathyroidism: with outpatient labs: PTH 459, phosphorus 3.4, calcium 9.7 on 06/08/22.   Lab Results  Component Value Date   PTH 194 (H) 05/11/2017   CALCIUM 8.7 (L) 06/22/2022   CAION 1.19 03/27/2022   PHOS 3.7 05/20/2022  Calcium remains within acceptable range.  Continue calcium acetate with meals.  Will obtain updated phosphorus level for morning labs.  4.  Hypertension with chronic kidney disease.  Home regimen includes amlodipine, clonidine, hydralazine, irbesartan, isosorbide, doxazosin and minoxidil. All currently held. Blood pressure 140/79.   LOS: 3   1/22/202410:59 AM

## 2022-06-22 NOTE — Progress Notes (Signed)
Received patient in bed to unit.  Alert and oriented to self and situtaion.  Informed consent signed and in chart.   Treatment initiated: 0918 Treatment completed: 5397  Patient's venous line infiltrated. HD terminated early. NP Colon Flattery notified. Transported back to the room  Alert, without acute distress.  Hand-off given to patient's nurse.   Access used: Left AVF   Total UF removed: 200 ml Medication(s) given: none    [facility]

## 2022-06-22 NOTE — Progress Notes (Signed)
  Progress Note    06/22/2022 4:51 PM * No surgery found *  Subjective:  Todd Brown is a 62 year old male with past medical conditions including COPD, hypertension, DVT, schizophrenia, and end-stage renal disease on hemodialysis.  Patient has been admitted for ESRD on dialysis.     Vitals:   06/22/22 1303 06/22/22 1638  BP: 131/77 121/76  Pulse: 73 68  Resp: 16 19  Temp: 98.8 F (37.1 C) 98.6 F (37 C)  SpO2: 96% 99%   Physical Exam: Cardiac:  RRR Lungs:  Clear throughout. Incisions:  Right chest. C/D/I  Extremities:  Warm to touch. Palpable pulses  Abdomen:  Positive bowel sounds, soft, NT/ND Neurologic: Altered Mental Status. Only knows his name.   CBC    Component Value Date/Time   WBC 9.0 06/22/2022 0512   RBC 2.50 (L) 06/22/2022 0512   HGB 8.6 (L) 06/22/2022 0512   HCT 25.6 (L) 06/22/2022 0512   PLT 143 (L) 06/22/2022 0512   MCV 102.4 (H) 06/22/2022 0512   MCH 34.4 (H) 06/22/2022 0512   MCHC 33.6 06/22/2022 0512   RDW 16.5 (H) 06/22/2022 0512   LYMPHSABS 0.3 (L) 06/20/2022 1120   MONOABS 0.7 06/20/2022 1120   EOSABS 0.1 06/20/2022 1120   BASOSABS 0.0 06/20/2022 1120    BMET    Component Value Date/Time   NA 135 06/22/2022 0512   K 5.3 (H) 06/22/2022 0512   CL 97 (L) 06/22/2022 0512   CO2 25 06/22/2022 0512   GLUCOSE 91 06/22/2022 0512   BUN 60 (H) 06/22/2022 0512   CREATININE 7.11 (H) 06/22/2022 0512   CREATININE 3.47 (H) 03/23/2013 1501   CALCIUM 8.7 (L) 06/22/2022 0512   CALCIUM 8.1 (L) 10/22/2014 0504   GFRNONAA 8 (L) 06/22/2022 0512   GFRAA 7 (L) 10/23/2019 0405    INR    Component Value Date/Time   INR 1.2 06/19/2022 1039     Intake/Output Summary (Last 24 hours) at 06/22/2022 1651 Last data filed at 06/22/2022 1047 Gross per 24 hour  Intake 120 ml  Output 200 ml  Net -80 ml     Assessment/Plan:  62 y.o. male is s/p Dialysis Perma catheter removal.  * No surgery found *   No complications to note. Dressing remains clean  dry and intact. No hematoma or seroma to note. No signs or symptoms of infection. Area of right chest remains sore to palpation. Patient recovering as expected. Vascular Surgery signs off.    Drema Pry [facility] 06/22/2022 4:51 PM

## 2022-06-23 DIAGNOSIS — R6510 Systemic inflammatory response syndrome (SIRS) of non-infectious origin without acute organ dysfunction: Secondary | ICD-10-CM | POA: Diagnosis not present

## 2022-06-23 LAB — CBC
HCT: 25.6 % — ABNORMAL LOW (ref 39.0–52.0)
Hemoglobin: 8.5 g/dL — ABNORMAL LOW (ref 13.0–17.0)
MCH: 33.9 pg (ref 26.0–34.0)
MCHC: 33.2 g/dL (ref 30.0–36.0)
MCV: 102 fL — ABNORMAL HIGH (ref 80.0–100.0)
Platelets: 136 10*3/uL — ABNORMAL LOW (ref 150–400)
RBC: 2.51 MIL/uL — ABNORMAL LOW (ref 4.22–5.81)
RDW: 16.1 % — ABNORMAL HIGH (ref 11.5–15.5)
WBC: 5.5 10*3/uL (ref 4.0–10.5)
nRBC: 0 % (ref 0.0–0.2)

## 2022-06-23 LAB — BASIC METABOLIC PANEL
Anion gap: 11 (ref 5–15)
BUN: 56 mg/dL — ABNORMAL HIGH (ref 8–23)
CO2: 27 mmol/L (ref 22–32)
Calcium: 8.5 mg/dL — ABNORMAL LOW (ref 8.9–10.3)
Chloride: 96 mmol/L — ABNORMAL LOW (ref 98–111)
Creatinine, Ser: 6.81 mg/dL — ABNORMAL HIGH (ref 0.61–1.24)
GFR, Estimated: 9 mL/min — ABNORMAL LOW (ref >60)
Glucose, Bld: 81 mg/dL (ref 70–99)
Potassium: 4.8 mmol/L (ref 3.5–5.1)
Sodium: 134 mmol/L — ABNORMAL LOW (ref 135–145)

## 2022-06-23 LAB — MAGNESIUM: Magnesium: 1.9 mg/dL (ref 1.7–2.4)

## 2022-06-23 MED ORDER — CEFAZOLIN SODIUM-DEXTROSE 2-4 GM/100ML-% IV SOLN
2.0000 g | INTRAVENOUS | Status: DC
  Filled 2022-06-23: qty 100

## 2022-06-23 MED ORDER — LEVOTHYROXINE SODIUM 50 MCG PO TABS
50.0000 ug | ORAL_TABLET | Freq: Every day | ORAL | Status: DC
  Administered 2022-06-24 – 2022-06-30 (×7): 50 ug via ORAL
  Filled 2022-06-23 (×7): qty 1

## 2022-06-23 MED ORDER — CEFAZOLIN SODIUM-DEXTROSE 2-4 GM/100ML-% IV SOLN
2.0000 g | Freq: Once | INTRAVENOUS | Status: AC
  Administered 2022-06-24: 2 g via INTRAVENOUS
  Filled 2022-06-23 (×2): qty 100

## 2022-06-23 MED ORDER — PENTAFLUOROPROP-TETRAFLUOROETH EX AERO
INHALATION_SPRAY | CUTANEOUS | Status: AC
  Filled 2022-06-23: qty 30

## 2022-06-23 MED ORDER — GUAIFENESIN-DM 100-10 MG/5ML PO SYRP
10.0000 mL | ORAL_SOLUTION | Freq: Three times a day (TID) | ORAL | Status: AC
  Administered 2022-06-23 – 2022-06-25 (×7): 10 mL via ORAL
  Filled 2022-06-23 (×8): qty 10

## 2022-06-23 NOTE — Consult Note (Signed)
[place] for Infectious Disease  Total days of antibiotics 5 Reason for Consult:strep gallolyticus bacteremia   Referring Physician: lai  Principal Problem:   SIRS (systemic inflammatory response syndrome) ([place]) Active Problems:   COPD (chronic obstructive pulmonary disease) ([place])   Essential hypertension   ESRD on dialysis ([place])   Acute encephalopathy   NSTEMI (non-ST elevated myocardial infarction) ([place])   Bacteremia    HPI: Todd Brown is a 62 y.o. male with hx of COPD on 2LNC supplemental oxygen, HTN, ESRD, schizophrenia lives in group home admitted on 1/19 for for worsening shortness of breath and AMS. He was found to be febrile with tachycardia but normotensive nd mild hypoxia. 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 He had indwelling HD line which has not been removed with tip cx growing + but identification still pending. He has been on ceftriaxone and no longer having fevers. TTE did not show vegetations.tolerating HD without difficulty   Past Medical History:  Diagnosis Date   Anemia    Arthritis    Chronic respiratory failure (HCC)    COPD (chronic obstructive pulmonary disease) ([place])    Dialysis patient ([place])    Mon. 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   ESRD (end stage renal disease) ([place])    ESRD (end stage renal disease) on dialysis ([place])    Hyperlipidemia    Hypertension    Malignant hypertension    Pneumonia 2016   Renal artery stenosis (HCC)    Schizophrenia (HCC)     Allergies:  Allergies  Allergen Reactions   Chlorpromazine Other (See Comments)    Reaction:  Todd , pt states it makes him feel real bad Reaction:  Todd , pt states it makes him feel real bad     Current antibiotics: ceftriaxone  MEDICATIONS:  amLODipine  10 mg Oral q morning   calcium acetate  1,334 mg Oral TID WC   carbamazepine  1,000 mg Oral q morning    Chlorhexidine Gluconate Cloth  6 each Topical Q0600   doxazosin  4 mg Oral QHS   fluPHENAZine  2.5 mg Oral Daily   guaiFENesin-dextromethorphan  10 mL Oral TID   heparin  5,000 Units Subcutaneous Q8H   hydrALAZINE  100 mg Oral TID   isosorbide mononitrate  60 mg Oral BID   [START ON 06/24/2022] levothyroxine  50 mcg Oral Q0600   minoxidil  2.5 mg Oral Daily   pantoprazole  40 mg Oral QAC breakfast   pentafluoroprop-tetrafluoroeth       sodium chloride flush  10-40 mL Intracatheter Q12H   sodium chloride flush  3 mL Intravenous Q12H   trihexyphenidyl  2 mg Oral BID    Social History   Tobacco Use   Smoking status: Never    Passive exposure: Never   Smokeless tobacco: Never  Vaping Use   Vaping Use: Never used  Substance Use Topics   Alcohol use: Not Currently   Drug use: Not Currently    Types: Marijuana    Comment: not since living at assisted living place 03/2020    Family History  Problem Relation Age of Onset   Kidney disease Brother    Diabetes Neg Hx     Review of Systems -  Mild shortness of breath -his baseline but otherwise 12 point ros is negative  OBJECTIVE: Temp:  [97.6 F (36.4 C)-98.6 F (37 C)] 98.2  F (36.8 C) (01/23 1242) Pulse Rate:  [58-84] 71 (01/23 1242) Resp:  [9-21] 19 (01/23 1242) BP: (121-174)/(71-90) 147/81 (01/23 1242) SpO2:  [92 %-99 %] 94 % (01/23 1242) Weight:  [74.5 kg-76.9 kg] 74.5 kg (01/23 1253) Physical Exam  Constitutional: He is oriented to person, place, and time. He appears well-developed and well-nourished. No distress.  HENT:  Mouth/Throat: Oropharynx is clear and moist. No oropharyngeal exudate.  Cardiovascular: Normal rate, regular rhythm and normal heart sounds. Exam reveals no gallop and no friction rub.  No murmur heard.  Pulmonary/Chest: Effort normal and breath sounds normal. No respiratory distress. He has no wheezes.  Abdominal: Soft. Bowel sounds are normal. He exhibits no distension. There is no tenderness.   Lymphadenopathy:  He has no cervical adenopathy.  Neurological: He is alert and oriented to person, place, and time.  Skin: Skin is warm and dry. No rash noted. No erythema.  Psychiatric: He has a normal mood and affect. His behavior is normal.    LABS: Results for orders placed or performed during the hospital encounter of 06/19/22 (from the past 48 hour(s))  Basic metabolic panel     Status: Abnormal   Collection Time: 06/22/22  5:12 AM  Result Value Ref Range   Sodium 135 135 - 145 mmol/L   Potassium 5.3 (H) 3.5 - 5.1 mmol/L   Chloride 97 (L) 98 - 111 mmol/L   CO2 25 22 - 32 mmol/L   Glucose, Bld 91 70 - 99 mg/dL    Comment: Glucose reference range applies only to samples taken after fasting for at least 8 hours.   BUN 60 (H) 8 - 23 mg/dL   Creatinine, Ser 7.11 (H) 0.61 - 1.24 mg/dL   Calcium 8.7 (L) 8.9 - 10.3 mg/dL   GFR, Estimated 8 (L) >60 mL/min    Comment: (NOTE) Calculated using the CKD-EPI Creatinine Equation (2021)    Anion gap 13 5 - 15    Comment: Performed at [facility], [address] [place], [place] [address]  CBC     Status: Abnormal   Collection Time: 06/22/22  5:12 AM  Result Value Ref Range   WBC 9.0 4.0 - 10.5 K/uL   RBC 2.50 (L) 4.22 - 5.81 MIL/uL   Hemoglobin 8.6 (L) 13.0 - 17.0 g/dL   HCT 25.6 (L) 39.0 - 52.0 %   MCV 102.4 (H) 80.0 - 100.0 fL   MCH 34.4 (H) 26.0 - 34.0 pg   MCHC 33.6 30.0 - 36.0 g/dL   RDW 16.5 (H) 11.5 - 15.5 %   Platelets 143 (L) 150 - 400 K/uL   nRBC 0.0 0.0 - 0.2 %    Comment: Performed at [facility], [address], [place], [place] [address]  Magnesium     Status: None   Collection Time: 06/22/22  5:12 AM  Result Value Ref Range   Magnesium 2.0 1.7 - 2.4 mg/dL    Comment: Performed at [facility], [address] [place], [place] [address]  Phosphorus     Status: Abnormal   Collection Time: 06/22/22  5:12 AM  Result Value Ref Range   Phosphorus 5.0 (H) 2.5 - 4.6 mg/dL    Comment:  Performed at [facility], [address] [place], [place] [address]  Basic metabolic panel     Status: Abnormal   Collection Time: 06/23/22  5:40 AM  Result Value Ref Range   Sodium 134 (L) 135 - 145 mmol/L   Potassium  4.8 3.5 - 5.1 mmol/L   Chloride 96 (L) 98 - 111 mmol/L   CO2 27 22 - 32 mmol/L   Glucose, Bld 81 70 - 99 mg/dL    Comment: Glucose reference range applies only to samples taken after fasting for at least 8 hours.   BUN 56 (H) 8 - 23 mg/dL   Creatinine, Ser 6.81 (H) 0.61 - 1.24 mg/dL   Calcium 8.5 (L) 8.9 - 10.3 mg/dL   GFR, Estimated 9 (L) >60 mL/min    Comment: (NOTE) Calculated using the CKD-EPI Creatinine Equation (2021)    Anion gap 11 5 - 15    Comment: Performed at [facility], [address] [place], [place] [address]  CBC     Status: Abnormal   Collection Time: 06/23/22  5:40 AM  Result Value Ref Range   WBC 5.5 4.0 - 10.5 K/uL   RBC 2.51 (L) 4.22 - 5.81 MIL/uL   Hemoglobin 8.5 (L) 13.0 - 17.0 g/dL   HCT 25.6 (L) 39.0 - 52.0 %   MCV 102.0 (H) 80.0 - 100.0 fL   MCH 33.9 26.0 - 34.0 pg   MCHC 33.2 30.0 - 36.0 g/dL   RDW 16.1 (H) 11.5 - 15.5 %   Platelets 136 (L) 150 - 400 K/uL   nRBC 0.0 0.0 - 0.2 %    Comment: Performed at [facility], [address] [place], [place] [address]  Magnesium     Status: None   Collection Time: 06/23/22  5:40 AM  Result Value Ref Range   Magnesium 1.9 1.7 - 2.4 mg/dL    Comment: Performed at [facility], [address] [place], [place] [address]    MICRO: 1/20 cath tip strep gallolyticus 1/19 blood cx strep gallolyticus  IMAGING: No results found.   Assessment/Plan:  62yo M with sirs/sepsis physiology on admit found to have strep gallolyticus, also involving his HD catheter which is now removed.  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 Would also recommend to get colonoscopy.  Elzie Rings [place] for Infectious Diseases [phone]

## 2022-06-23 NOTE — Progress Notes (Addendum)
[facility]  ROUNDING NOTE   Subjective:   Todd Brown is a 62 year old male with past medical conditions including COPD, hypertension, DVT, schizophrenia, and end-stage renal disease on hemodialysis.  Patient presents to the emergency department with somnolence. Patient has been admitted for ESRD on dialysis ([place]) [N18.6, Z99.2] Acute encephalopathy [G93.40] Altered mental status, unspecified altered mental status type [R41.82] Sepsis with encephalopathy without septic shock, due to unspecified organism ([place]) [A41.9, R65.20, G93.41] Bacteremia [R78.81]  Patient is known to our practice and receives outpatient dialysis treatments at [facility] on a MWF schedule, supervised by Dr. Holley Raring.    Patient seen and evaluated during dialysis   HEMODIALYSIS FLOWSHEET:  Blood Flow Rate (mL/min): 300 mL/min Arterial Pressure (mmHg): -1100 mmHg Venous Pressure (mmHg): 210 mmHg TMP (mmHg): 20 mmHg Ultrafiltration Rate (mL/min): 514 mL/min Dialysate Flow Rate (mL/min): 300 ml/min Dialysis Fluid Bolus: Normal Saline Bolus Amount (mL): 300 mL  Tolerating treatment well. Fistula functioning well today    Objective:  Vital signs in last 24 hours:  Temp:  [97.6 F (36.4 C)-98.8 F (37.1 C)] 97.6 F (36.4 C) (01/23 0819) Pulse Rate:  [58-84] 84 (01/23 1130) Resp:  [9-21] 14 (01/23 1130) BP: (121-174)/(71-90) 144/78 (01/23 1130) SpO2:  [92 %-99 %] 92 % (01/23 1130) Weight:  [75.3 kg-76.9 kg] 75.3 kg (01/23 0825)  Weight change:  Filed Weights   06/22/22 1054 06/23/22 0500 06/23/22 0825  Weight: 74.6 kg 76.9 kg 75.3 kg    Intake/Output: I/O last 3 completed shifts: In: 360 [P.O.:360] Out: 200 [Other:200]   Intake/Output this shift:  No intake/output data recorded.  Physical Exam: General: NAD  Head: Normocephalic, atraumatic. Moist oral mucosal membranes  Eyes: Anicteric  Lungs:  Clear to auscultation, normal effort, room air  Heart: Regular rate and  rhythm  Abdomen:  Soft, tender, mild distension  Extremities:  Trace peripheral edema.  Neurologic: Nonfocal, moving all four extremities  Skin: No lesions  Access: Lt AVF    Basic Metabolic Panel: Recent Labs  Lab 06/19/22 1039 06/20/22 1120 06/21/22 0649 06/22/22 0512 06/23/22 0540  NA 135 134* 134* 135 134*  K 4.6 3.7 4.8 5.3* 4.8  CL 94* 95* 97* 97* 96*  CO2 26 28 25 25 27   GLUCOSE 81 104* 80 91 81  BUN 48* 30* 47* 60* 56*  CREATININE 6.50* 3.61* 5.96* 7.11* 6.81*  CALCIUM 9.7 9.1 9.0 8.7* 8.5*  MG  --   --  1.9 2.0 1.9  PHOS  --   --   --  5.0*  --      Liver Function Tests: Recent Labs  Lab 06/19/22 1039 06/20/22 1120  AST 37 47*  ALT 25 38  ALKPHOS 98 88  BILITOT 1.0 0.8  PROT 7.3 6.6  ALBUMIN 4.0 3.5    No results for input(s): "LIPASE", "AMYLASE" in the last 168 hours. No results for input(s): "AMMONIA" in the last 168 hours.  CBC: Recent Labs  Lab 06/19/22 1039 06/20/22 1120 06/21/22 0649 06/22/22 0512 06/23/22 0540  WBC 14.3* 13.7* 10.8* 9.0 5.5  NEUTROABS 12.8* 12.2*  --   --   --   HGB 9.8* 8.9* 8.1* 8.6* 8.5*  HCT 30.5* 26.7* 24.4* 25.6* 25.6*  MCV 106.6* 102.3* 103.8* 102.4* 102.0*  PLT 163 154 128* 143* 136*     Cardiac Enzymes: No results for input(s): "CKTOTAL", "CKMB", "CKMBINDEX", "TROPONINI" in the last 168 hours.  BNP: Invalid input(s): "POCBNP"  CBG: No results for input(s): "GLUCAP" in the  last 168 hours.  Microbiology: Results for orders placed or performed during the hospital encounter of 06/19/22  Resp panel by RT-PCR (RSV, Flu A&B, Covid) Anterior Nasal Swab     Status: None   Collection Time: 06/19/22 10:31 AM   Specimen: Anterior Nasal Swab  Result Value Ref Range Status   SARS Coronavirus 2 by RT PCR NEGATIVE NEGATIVE Final    Comment: (NOTE) SARS-CoV-2 target nucleic acids are NOT DETECTED.  The SARS-CoV-2 RNA is generally detectable in upper respiratory specimens during the acute phase of infection. The  lowest concentration of SARS-CoV-2 viral copies this assay can detect is 138 copies/mL. A negative result does not preclude SARS-Cov-2 infection and should not be used as the sole basis for treatment or other patient management decisions. A negative result may occur with  improper specimen collection/handling, submission of specimen other than nasopharyngeal swab, presence of viral mutation(s) within the areas targeted by this assay, and inadequate number of viral copies(<138 copies/mL). A negative result must be combined with clinical observations, patient history, and epidemiological information. The expected result is Negative.  Fact Sheet for Patients:  EntrepreneurPulse.com.au  Fact Sheet for Healthcare Providers:  IncredibleEmployment.be  This test is no t yet approved or cleared by the [place] FDA and  has been authorized for detection and/or diagnosis of SARS-CoV-2 by FDA under an Emergency Use Authorization (EUA). This EUA will remain  in effect (meaning this test can be used) for the duration of the COVID-19 declaration under Section 564(b)(1) of the Act, 21 U.S.C.section 360bbb-3(b)(1), unless the authorization is terminated  or revoked sooner.       Influenza A by PCR NEGATIVE NEGATIVE Final   Influenza B by PCR NEGATIVE NEGATIVE Final    Comment: (NOTE) The Xpert Xpress SARS-CoV-2/FLU/RSV plus assay is intended as an aid in the diagnosis of influenza from Nasopharyngeal swab specimens and should not be used as a sole basis for treatment. Nasal washings and aspirates are unacceptable for Xpert Xpress SARS-CoV-2/FLU/RSV testing.  Fact Sheet for Patients: EntrepreneurPulse.com.au  Fact Sheet for Healthcare Providers: IncredibleEmployment.be  This test is not yet approved or cleared by the [place] FDA and has been authorized for detection and/or diagnosis of SARS-CoV-2 by FDA under  an Emergency Use Authorization (EUA). This EUA will remain in effect (meaning this test can be used) for the duration of the COVID-19 declaration under Section 564(b)(1) of the Act, 21 U.S.C. section 360bbb-3(b)(1), unless the authorization is terminated or revoked.     Resp Syncytial Virus by PCR NEGATIVE NEGATIVE Final    Comment: (NOTE) Fact Sheet for Patients: EntrepreneurPulse.com.au  Fact Sheet for Healthcare Providers: IncredibleEmployment.be  This test is not yet approved or cleared by the [place] FDA and has been authorized for detection and/or diagnosis of SARS-CoV-2 by FDA under an Emergency Use Authorization (EUA). This EUA will remain in effect (meaning this test can be used) for the duration of the COVID-19 declaration under Section 564(b)(1) of the Act, 21 U.S.C. section 360bbb-3(b)(1), unless the authorization is terminated or revoked.  Performed at [facility], [address] [place], [place] [address]   Blood Culture (routine x 2)     Status: Abnormal   Collection Time: 06/19/22 10:39 AM   Specimen: BLOOD  Result Value Ref Range Status   Specimen Description   Final    BLOOD RIGHT [facility] Performed at [facility], [address], [place], [place] [address]    Special Requests   Final  BOTTLES DRAWN AEROBIC AND ANAEROBIC Blood Culture adequate volume Performed at [facility], [address] [place], [place] [address]    Culture  Setup Time   Final    Organism ID to follow IN BOTH AEROBIC AND ANAEROBIC BOTTLES GRAM POSITIVE COCCI CRITICAL RESULT CALLED TO, READ BACK BY AND VERIFIED WITH: JASON ROBBINS 06/19/2022 AT 2147 SRR    Culture STREPTOCOCCUS GALLOLYTICUS (A)  Final   Report Status 06/22/2022 FINAL  Final   Organism ID, Bacteria STREPTOCOCCUS GALLOLYTICUS  Final      Susceptibility   Streptococcus gallolyticus - MIC*    PENICILLIN 0.12 SENSITIVE Sensitive     CEFTRIAXONE  0.25 SENSITIVE Sensitive     ERYTHROMYCIN >=8 RESISTANT Resistant     LEVOFLOXACIN 2 SENSITIVE Sensitive     VANCOMYCIN 0.5 SENSITIVE Sensitive     * STREPTOCOCCUS GALLOLYTICUS  Blood Culture ID Panel (Reflexed)     Status: Abnormal   Collection Time: 06/19/22 10:39 AM  Result Value Ref Range Status   Enterococcus faecalis NOT DETECTED NOT DETECTED Final   Enterococcus Faecium NOT DETECTED NOT DETECTED Final   Listeria monocytogenes NOT DETECTED NOT DETECTED Final   Staphylococcus species NOT DETECTED NOT DETECTED Final   Staphylococcus aureus (BCID) NOT DETECTED NOT DETECTED Final   Staphylococcus epidermidis NOT DETECTED NOT DETECTED Final   Staphylococcus lugdunensis NOT DETECTED NOT DETECTED Final   Streptococcus species DETECTED (A) NOT DETECTED Final    Comment: Not Enterococcus species, Streptococcus agalactiae, Streptococcus pyogenes, or Streptococcus pneumoniae. CRITICAL RESULT CALLED TO, READ BACK BY AND VERIFIED WITH: JASON ROBBINS 06/19/2022 AT 2147 SRR    Streptococcus agalactiae NOT DETECTED NOT DETECTED Final   Streptococcus pneumoniae NOT DETECTED NOT DETECTED Final   Streptococcus pyogenes NOT DETECTED NOT DETECTED Final   A.calcoaceticus-baumannii NOT DETECTED NOT DETECTED Final   Bacteroides fragilis NOT DETECTED NOT DETECTED Final   Enterobacterales NOT DETECTED NOT DETECTED Final   Enterobacter cloacae complex NOT DETECTED NOT DETECTED Final   Escherichia coli NOT DETECTED NOT DETECTED Final   Klebsiella aerogenes NOT DETECTED NOT DETECTED Final   Klebsiella oxytoca NOT DETECTED NOT DETECTED Final   Klebsiella pneumoniae NOT DETECTED NOT DETECTED Final   Proteus species NOT DETECTED NOT DETECTED Final   Salmonella species NOT DETECTED NOT DETECTED Final   Serratia marcescens NOT DETECTED NOT DETECTED Final   Haemophilus influenzae NOT DETECTED NOT DETECTED Final   Neisseria meningitidis NOT DETECTED NOT DETECTED Final   Pseudomonas aeruginosa NOT DETECTED NOT  DETECTED Final   Stenotrophomonas maltophilia NOT DETECTED NOT DETECTED Final   Candida albicans NOT DETECTED NOT DETECTED Final   Candida auris NOT DETECTED NOT DETECTED Final   Candida glabrata NOT DETECTED NOT DETECTED Final   Candida krusei NOT DETECTED NOT DETECTED Final   Candida parapsilosis NOT DETECTED NOT DETECTED Final   Candida tropicalis NOT DETECTED NOT DETECTED Final   Cryptococcus neoformans/gattii NOT DETECTED NOT DETECTED Final    Comment: Performed at [facility], [address] [place], [place] [address]  Blood Culture (routine x 2)     Status: Abnormal   Collection Time: 06/19/22 10:49 AM   Specimen: BLOOD  Result Value Ref Range Status   Specimen Description   Final    BLOOD RIGHT [facility] Performed at [facility], [address] [place], [place] [address]    Special Requests   Final    BOTTLES DRAWN AEROBIC AND ANAEROBIC Blood Culture adequate volume Performed at [facility], [address] [place], [place] [address]    Culture  Setup Time   Final    IN BOTH AEROBIC AND ANAEROBIC BOTTLES GRAM POSITIVE COCCI CRITICAL RESULT CALLED TO, READ BACK BY AND VERIFIED WITH: JASON ROBBINS 06/19/2022 AT 2147 SRR    Culture (A)  Final    STREPTOCOCCUS GALLOLYTICUS SUSCEPTIBILITIES PERFORMED ON PREVIOUS CULTURE WITHIN THE LAST 5 DAYS. Performed at [facility], [place] [address] [place], [place] [address]    Report Status 06/22/2022 FINAL  Final  Culture, blood (Routine X 2) w Reflex to ID Panel     Status: None (Preliminary result)   Collection Time: 06/19/22  7:40 PM   Specimen: BLOOD  Result Value Ref Range Status   Specimen Description BLOOD BLOOD RIGHT HAND  Final   Special Requests   Final    BOTTLES DRAWN AEROBIC ONLY Blood Culture results may not be optimal due to an inadequate volume of blood received in culture bottles   Culture   Final    NO GROWTH 4 DAYS Performed at [facility], [address]  [place], [place] [address]    Report Status PENDING  Incomplete  Culture, blood (Routine X 2) w Reflex to ID Panel     Status: None (Preliminary result)   Collection Time: 06/19/22  7:40 PM   Specimen: BLOOD  Result Value Ref Range Status   Specimen Description BLOOD THUMB RIGHT  Final   Special Requests IN PEDIATRIC BOTTLE Blood Culture adequate volume  Final   Culture   Final    NO GROWTH 4 DAYS Performed at [facility], [address], [place], [place] [address]    Report Status PENDING  Incomplete  Cath Tip Culture     Status: None (Preliminary result)   Collection Time: 06/20/22 11:24 AM   Specimen: Catheter Tip; Other  Result Value Ref Range Status   Specimen Description   Final    CATH TIP Performed at [facility], [address], [place], [place] [address]    Special Requests   Final    NONE Performed at [facility], [address] [place], [place] [address]    Culture   Final    CULTURE REINCUBATED FOR BETTER GROWTH Performed at [facility], [place] [address], [place], [place] [address]    Report Status PENDING  Incomplete    Coagulation Studies: No results for input(s): "LABPROT", "INR" in the last 72 hours.   Urinalysis: No results for input(s): "COLORURINE", "LABSPEC", "PHURINE", "GLUCOSEU", "HGBUR", "BILIRUBINUR", "KETONESUR", "PROTEINUR", "UROBILINOGEN", "NITRITE", "LEUKOCYTESUR" in the last 72 hours.  Invalid input(s): "APPERANCEUR"    Imaging: No results found.   Medications:    anticoagulant sodium citrate     [START ON 06/25/2022]  ceFAZolin (ANCEF) IV     [START ON 06/24/2022]  ceFAZolin (ANCEF) IV      amLODipine  10 mg Oral q morning   calcium acetate  1,334 mg Oral TID WC   carbamazepine  1,000 mg Oral q morning   Chlorhexidine Gluconate Cloth  6 each Topical Q0600   doxazosin  4 mg Oral QHS   fluPHENAZine  2.5 mg Oral Daily   heparin  5,000 Units Subcutaneous Q8H   hydrALAZINE  100 mg Oral TID    isosorbide mononitrate  60 mg Oral BID   minoxidil  2.5 mg Oral Daily   pantoprazole  40 mg Oral QAC breakfast   pentafluoroprop-tetrafluoroeth       sodium chloride flush  10-40 mL Intracatheter Q12H   sodium chloride flush  3 mL Intravenous Q12H   trihexyphenidyl  2 mg Oral BID   acetaminophen **OR** acetaminophen, alteplase, anticoagulant sodium citrate, guaiFENesin-dextromethorphan, heparin, hydrALAZINE, ipratropium-albuterol, lidocaine (PF), lidocaine-prilocaine, pentafluoroprop-tetrafluoroeth, pentafluoroprop-tetrafluoroeth, polyethylene glycol  Assessment/ Plan:  Todd Brown is a 62 y.o.  male ith past medical conditions including COPD, hypertension, DVT, schizophrenia, and end-stage renal disease on hemodialysis.  Patient presents to the emergency department with somnolence. Patient has been admitted for ESRD on dialysis ([place]) [N18.6, Z99.2] Acute encephalopathy [G93.40] Altered mental status, unspecified altered mental status type [R41.82] Sepsis with encephalopathy without septic shock, due to unspecified organism ([place]) [A41.9, R65.20, G93.41] Bacteremia [R78.81]  [facility]/MWF/Rt Perm[facility]ath  End stage renal disease on hemodialysis. Patient received HD yesterday. Streptococcus was detected and vascular removed CVC on 06/21/22. Fistula used yesterday during dialysis, but infiltrated after 1 hour of treatment. Treatment terminated and ice applied to access. Receiving treatment today, access cannulated well and treatment being performed without issue. Next treatment scheduled for Wednesday.   2. Anemia of chronic kidney disease Lab Results  Component Value Date   HGB 8.5 (L) 06/23/2022  Patient receives Mircera in outpatient clinic. Hgb remains decreased but stable.  3. Secondary Hyperparathyroidism: with outpatient labs: PTH 459, phosphorus 3.4, calcium 9.7 on 06/08/22.   Lab Results  Component Value Date   PTH 194 (H) 05/11/2017   CALCIUM 8.5 (L) 06/23/2022    CAION 1.19 03/27/2022   PHOS 5.0 (H) 06/22/2022  Bone minerals acceptable at this time. Continue calcium acetate with meals.   4.  Hypertension with chronic kidney disease.  Home regimen includes amlodipine, clonidine, hydralazine, irbesartan, isosorbide, doxazosin and minoxidil. All currently held. Blood pressure 154/85 during dialysis.  5. 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 Prescribed cefazoline. Will require treatment beyond discharge. Current recommendation for cefazolin 2g/2g/2g with dialysis.    LOS: [place] 1/23/202411:47 AM

## 2022-06-23 NOTE — Progress Notes (Signed)
Multiple needle sticks performed.  Clots pulled from arterial needle and then no flow from the other needle.  Recannulated arterial x3.  Good blood flow from venous needle.

## 2022-06-23 NOTE — Progress Notes (Signed)
PT did 3.5 hrs oF HD, tolerated well with no signs of distress  UF = 1000 ml    06/23/22 1242  Vitals  Temp 98.2 F (36.8 C)  Temp Source Oral  BP (!) 147/81  MAP (mmHg) 101  BP Location Right Arm  BP Method Automatic  Patient Position (if appropriate) Lying  Pulse Rate 71  Pulse Rate Source Monitor  ECG Heart Rate 71  Resp 19  Oxygen Therapy  SpO2 94 %  O2 Device Nasal Cannula  O2 Flow Rate (L/min) 2 L/min  Patient Activity (if Appropriate) In bed  Pulse Oximetry Type Continuous  Post Treatment  Dialyzer Clearance Lightly streaked  Duration of HD Treatment -hour(s) 3.5 hour(s)  Hemodialysis Intake (mL) 0 mL  Liters Processed 61.3  Fluid Removed (mL) 1000 mL  Tolerated HD Treatment Yes  Post-Hemodialysis Comments No issues  AVG/AVF Arterial Site Held (minutes) 10 minutes  AVG/AVF Venous Site Held (minutes) 10 minutes  Note  Observations resting  Fistula / Graft Left Upper arm Arteriovenous fistula  Placement Date/Time: (c) 12/15/21 2000   Orientation: Left  Access Location: Upper arm  Access Type: Arteriovenous fistula  Site Condition No complications  Fistula / Graft Assessment Thrill;Bruit;Present  Status Deaccessed  Needle Size 16  Drainage Description None

## 2022-06-23 NOTE — Progress Notes (Signed)
  PROGRESS NOTE    Todd Brown  QZE:[id number] DOB: Aug 16, 1960 DOA: 06/19/2022 PCP: [name], NP  [id number]  LOS: 4 days   Brief hospital course:   Assessment & Plan: Todd Brown is a 62 y.o. male with medical history significant of ESRD on HD, COPD with chronic hypoxic respiratory failure on 2 L at baseline, schizophrenia, anemia of chronic disease, hypertension, hyperlipidemia, who presented from group home to the ED due to altered mental status and shortness of breath.    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  However, strep gallolyticus has been associated with colon cancer.  --Echo showed no vegetation Plan: --cont ceftriaxone --consult ID --TEE to ruled out endocarditis --colonoscopy  Severe sepsis fever up to 101, WBC of 14 and tachycardia up to 105, with lactic acid 2.9.  source bacteremia.  Troponin elevation 2/2 demand ischemia --trop 190's flat. --cardiology consulted, no cardiac workup needed --Follow-up with Dr. Clayborn Bigness after discharge   Acute metabolic encephalopathy Likely multifactorial in the setting of acute illness.  Patient is protecting his airway at this time.  No hypercarbia on VBG.  Folate and B12 within normal limits recently - Delirium precautions  ESRD on dialysis [facility]) --iHD per nephrology --PermCath removed, currently no plan to insert another one, as nephro now is able to use pt's fistula.  Essential hypertension --cont home amlodipine, doxazosin, hydralazine, Imdur and minoxidil  COPD (chronic obstructive pulmonary disease) (HCC) Chronic hypoxic respiratory failure on 2 L at baseline --does not appear to be in exacerbation   DVT prophylaxis: Heparin SQ Code Status: Full code  Family Communication:  Level of care: Med-Surg Dispo:   The patient is from: group home Anticipated d/c is to: group home Anticipated d/c date is: 2-3 days.  Need TEE  and ID to finalize abx plan.   Subjective and Interval History:  Received HD today.     Objective: Vitals:   06/23/22 1200 06/23/22 1230 06/23/22 1242 06/23/22 1253  BP: (!) 173/83 (!) 166/84 (!) 147/81   Pulse: 75 72 71   Resp: 16 18 19    Temp:   98.2 F (36.8 C)   TempSrc:   Oral   SpO2: 94% 94% 94%   Weight:    74.5 kg    Intake/Output Summary (Last 24 hours) at 06/23/2022 1522 Last data filed at 06/23/2022 1443 Gross per 24 hour  Intake 360 ml  Output 1000 ml  Net -640 ml   Filed Weights   06/23/22 0500 06/23/22 0825 06/23/22 1253  Weight: 76.9 kg 75.3 kg 74.5 kg    Examination:   Constitutional: NAD, alert, speech difficult to understand HEENT: conjunctivae and lids normal, EOMI CV: No cyanosis.   RESP: normal respiratory effort Extremities: some edema in BLE SKIN: warm, dry   Data Reviewed: I have personally reviewed labs and imaging studies  Time spent: 35 minutes  Enzo Bi, MD [facility] If 7PM-7AM, please contact night-coverage 06/23/2022, 3:22 PM

## 2022-06-24 LAB — CATH TIP CULTURE

## 2022-06-24 LAB — BASIC METABOLIC PANEL
Anion gap: 12 (ref 5–15)
BUN: 40 mg/dL — ABNORMAL HIGH (ref 8–23)
CO2: 27 mmol/L (ref 22–32)
Calcium: 8.7 mg/dL — ABNORMAL LOW (ref 8.9–10.3)
Chloride: 97 mmol/L — ABNORMAL LOW (ref 98–111)
Creatinine, Ser: 5.1 mg/dL — ABNORMAL HIGH (ref 0.61–1.24)
GFR, Estimated: 12 mL/min — ABNORMAL LOW (ref >60)
Glucose, Bld: 86 mg/dL (ref 70–99)
Potassium: 4.6 mmol/L (ref 3.5–5.1)
Sodium: 136 mmol/L (ref 135–145)

## 2022-06-24 LAB — CULTURE, BLOOD (ROUTINE X 2)
Culture: NO GROWTH
Culture: NO GROWTH
Special Requests: ADEQUATE

## 2022-06-24 LAB — CBC
HCT: 27.2 % — ABNORMAL LOW (ref 39.0–52.0)
Hemoglobin: 9.1 g/dL — ABNORMAL LOW (ref 13.0–17.0)
MCH: 34.3 pg — ABNORMAL HIGH (ref 26.0–34.0)
MCHC: 33.5 g/dL (ref 30.0–36.0)
MCV: 102.6 fL — ABNORMAL HIGH (ref 80.0–100.0)
Platelets: 144 10*3/uL — ABNORMAL LOW (ref 150–400)
RBC: 2.65 MIL/uL — ABNORMAL LOW (ref 4.22–5.81)
RDW: 16 % — ABNORMAL HIGH (ref 11.5–15.5)
WBC: 6 10*3/uL (ref 4.0–10.5)
nRBC: 0 % (ref 0.0–0.2)

## 2022-06-24 LAB — GLUCOSE, CAPILLARY: Glucose-Capillary: 123 mg/dL — ABNORMAL HIGH (ref 70–99)

## 2022-06-24 LAB — MAGNESIUM: Magnesium: 1.9 mg/dL (ref 1.7–2.4)

## 2022-06-24 MED ORDER — CEFAZOLIN SODIUM-DEXTROSE 1-4 GM/50ML-% IV SOLN
1.0000 g | Freq: Once | INTRAVENOUS | Status: AC
  Administered 2022-06-24: 1 g via INTRAVENOUS
  Filled 2022-06-24: qty 50

## 2022-06-24 MED ORDER — CEFAZOLIN SODIUM-DEXTROSE 2-4 GM/100ML-% IV SOLN
2.0000 g | INTRAVENOUS | Status: DC
  Administered 2022-06-26 – 2022-06-29 (×2): 2 g via INTRAVENOUS
  Filled 2022-06-24 (×3): qty 100

## 2022-06-24 NOTE — Progress Notes (Signed)
Pre hd rn assessment 

## 2022-06-24 NOTE — Progress Notes (Signed)
Pt 3 hour HD complete w/ no complications. Alert, stable, no c/o, report to primary RN. Start: 0801 End: 1102 1046ml fluid removed 54L BVP 76.3 Kg post bed hd weight

## 2022-06-24 NOTE — Progress Notes (Signed)
[facility]  ROUNDING NOTE   Subjective:   Todd Brown is a 62 year old male with past medical conditions including COPD, hypertension, DVT, schizophrenia, and end-stage renal disease on hemodialysis.  Patient presents to the emergency department with somnolence. Patient has been admitted for ESRD on dialysis ([place]) [N18.6, Z99.2] Acute encephalopathy [G93.40] Altered mental status, unspecified altered mental status type [R41.82] Sepsis with encephalopathy without septic shock, due to unspecified organism ([place]) [A41.9, R65.20, G93.41] Bacteremia [R78.81]  Patient is known to our practice and receives outpatient dialysis treatments at [facility] on a MWF schedule, supervised by Dr. Holley Raring.    Patient seen and evaluated during dialysis   HEMODIALYSIS FLOWSHEET:  Blood Flow Rate (mL/min): 300 mL/min Arterial Pressure (mmHg): -210 mmHg Venous Pressure (mmHg): 180 mmHg TMP (mmHg): 21 mmHg Ultrafiltration Rate (mL/min): 431 mL/min Dialysate Flow Rate (mL/min): 300 ml/min Dialysis Fluid Bolus: Normal Saline Bolus Amount (mL): 300 mL  Tolerating treatment well No complaints to offer at this time    Objective:  Vital signs in last 24 hours:  Temp:  [98.1 F (36.7 C)-98.9 F (37.2 C)] 98.9 F (37.2 C) (01/24 1102) Pulse Rate:  [69-92] 74 (01/24 1109) Resp:  [14-19] 16 (01/24 1109) BP: (118-173)/(65-126) 133/80 (01/24 1109) SpO2:  [94 %-99 %] 97 % (01/24 1109) Weight:  [74.5 kg-78.3 kg] 76.3 kg (01/24 1119)  Weight change: -1.8 kg Filed Weights   06/24/22 0500 06/24/22 0751 06/24/22 1119  Weight: 78.3 kg 77 kg 76.3 kg    Intake/Output: I/O last 3 completed shifts: In: 215.6 [P.O.:120; IV Piggyback:95.6] Out: 1000 [Other:1000]   Intake/Output this shift:  Total I/O In: 300 [P.O.:300] Out: 1000 [Other:1000]  Physical Exam: General: NAD  Head: Normocephalic, atraumatic. Moist oral mucosal membranes  Eyes: Anicteric  Lungs:  Clear to  auscultation, normal effort, room air  Heart: Regular rate and rhythm  Abdomen:  Soft, tender, mild distension  Extremities:  Trace peripheral edema.  Neurologic: Nonfocal, moving all four extremities  Skin: No lesions  Access: Lt AVF    Basic Metabolic Panel: Recent Labs  Lab 06/20/22 1120 06/21/22 0649 06/22/22 0512 06/23/22 0540 06/24/22 0216  NA 134* 134* 135 134* 136  K 3.7 4.8 5.3* 4.8 4.6  CL 95* 97* 97* 96* 97*  CO2 28 25 25 27 27   GLUCOSE 104* 80 91 81 86  BUN 30* 47* 60* 56* 40*  CREATININE 3.61* 5.96* 7.11* 6.81* 5.10*  CALCIUM 9.1 9.0 8.7* 8.5* 8.7*  MG  --  1.9 2.0 1.9 1.9  PHOS  --   --  5.0*  --   --      Liver Function Tests: Recent Labs  Lab 06/19/22 1039 06/20/22 1120  AST 37 47*  ALT 25 38  ALKPHOS 98 88  BILITOT 1.0 0.8  PROT 7.3 6.6  ALBUMIN 4.0 3.5    No results for input(s): "LIPASE", "AMYLASE" in the last 168 hours. No results for input(s): "AMMONIA" in the last 168 hours.  CBC: Recent Labs  Lab 06/19/22 1039 06/20/22 1120 06/21/22 0649 06/22/22 0512 06/23/22 0540 06/24/22 0216  WBC 14.3* 13.7* 10.8* 9.0 5.5 6.0  NEUTROABS 12.8* 12.2*  --   --   --   --   HGB 9.8* 8.9* 8.1* 8.6* 8.5* 9.1*  HCT 30.5* 26.7* 24.4* 25.6* 25.6* 27.2*  MCV 106.6* 102.3* 103.8* 102.4* 102.0* 102.6*  PLT 163 154 128* 143* 136* 144*     Cardiac Enzymes: No results for input(s): "CKTOTAL", "CKMB", "CKMBINDEX", "TROPONINI" in  the last 168 hours.  BNP: Invalid input(s): "POCBNP"  CBG: No results for input(s): "GLUCAP" in the last 168 hours.  Microbiology: Results for orders placed or performed during the hospital encounter of 06/19/22  Resp panel by RT-PCR (RSV, Flu A&B, Covid) Anterior Nasal Swab     Status: None   Collection Time: 06/19/22 10:31 AM   Specimen: Anterior Nasal Swab  Result Value Ref Range Status   SARS Coronavirus 2 by RT PCR NEGATIVE NEGATIVE Final    Comment: (NOTE) SARS-CoV-2 target nucleic acids are NOT DETECTED.  The  SARS-CoV-2 RNA is generally detectable in upper respiratory specimens during the acute phase of infection. The lowest concentration of SARS-CoV-2 viral copies this assay can detect is 138 copies/mL. A negative result does not preclude SARS-Cov-2 infection and should not be used as the sole basis for treatment or other patient management decisions. A negative result may occur with  improper specimen collection/handling, submission of specimen other than nasopharyngeal swab, presence of viral mutation(s) within the areas targeted by this assay, and inadequate number of viral copies(<138 copies/mL). A negative result must be combined with clinical observations, patient history, and epidemiological information. The expected result is Negative.  Fact Sheet for Patients:  EntrepreneurPulse.com.au  Fact Sheet for Healthcare Providers:  IncredibleEmployment.be  This test is no t yet approved or cleared by the [place] FDA and  has been authorized for detection and/or diagnosis of SARS-CoV-2 by FDA under an Emergency Use Authorization (EUA). This EUA will remain  in effect (meaning this test can be used) for the duration of the COVID-19 declaration under Section 564(b)(1) of the Act, 21 U.S.C.section 360bbb-3(b)(1), unless the authorization is terminated  or revoked sooner.       Influenza A by PCR NEGATIVE NEGATIVE Final   Influenza B by PCR NEGATIVE NEGATIVE Final    Comment: (NOTE) The Xpert Xpress SARS-CoV-2/FLU/RSV plus assay is intended as an aid in the diagnosis of influenza from Nasopharyngeal swab specimens and should not be used as a sole basis for treatment. Nasal washings and aspirates are unacceptable for Xpert Xpress SARS-CoV-2/FLU/RSV testing.  Fact Sheet for Patients: EntrepreneurPulse.com.au  Fact Sheet for Healthcare Providers: IncredibleEmployment.be  This test is not yet approved or  cleared by the [place] FDA and has been authorized for detection and/or diagnosis of SARS-CoV-2 by FDA under an Emergency Use Authorization (EUA). This EUA will remain in effect (meaning this test can be used) for the duration of the COVID-19 declaration under Section 564(b)(1) of the Act, 21 U.S.C. section 360bbb-3(b)(1), unless the authorization is terminated or revoked.     Resp Syncytial Virus by PCR NEGATIVE NEGATIVE Final    Comment: (NOTE) Fact Sheet for Patients: EntrepreneurPulse.com.au  Fact Sheet for Healthcare Providers: IncredibleEmployment.be  This test is not yet approved or cleared by the [place] FDA and has been authorized for detection and/or diagnosis of SARS-CoV-2 by FDA under an Emergency Use Authorization (EUA). This EUA will remain in effect (meaning this test can be used) for the duration of the COVID-19 declaration under Section 564(b)(1) of the Act, 21 U.S.C. section 360bbb-3(b)(1), unless the authorization is terminated or revoked.  Performed at [facility], [address] [place], [place] [address]   Blood Culture (routine x 2)     Status: Abnormal   Collection Time: 06/19/22 10:39 AM   Specimen: BLOOD  Result Value Ref Range Status   Specimen Description   Final    BLOOD RIGHT [facility] Performed at [facility], [address] [place], [place] [address]    Special Requests   Final    BOTTLES DRAWN AEROBIC AND ANAEROBIC Blood Culture adequate volume Performed at [facility], [address] [place], [place] [address]    Culture  Setup Time   Final    Organism ID to follow IN BOTH AEROBIC AND ANAEROBIC BOTTLES GRAM POSITIVE COCCI CRITICAL RESULT CALLED TO, READ BACK BY AND VERIFIED WITH: JASON ROBBINS 06/19/2022 AT 2147 SRR    Culture STREPTOCOCCUS GALLOLYTICUS (A)  Final   Report Status 06/22/2022 FINAL  Final   Organism ID, Bacteria STREPTOCOCCUS GALLOLYTICUS  Final       Susceptibility   Streptococcus gallolyticus - MIC*    PENICILLIN 0.12 SENSITIVE Sensitive     CEFTRIAXONE 0.25 SENSITIVE Sensitive     ERYTHROMYCIN >=8 RESISTANT Resistant     LEVOFLOXACIN 2 SENSITIVE Sensitive     VANCOMYCIN 0.5 SENSITIVE Sensitive     * STREPTOCOCCUS GALLOLYTICUS  Blood Culture ID Panel (Reflexed)     Status: Abnormal   Collection Time: 06/19/22 10:39 AM  Result Value Ref Range Status   Enterococcus faecalis NOT DETECTED NOT DETECTED Final   Enterococcus Faecium NOT DETECTED NOT DETECTED Final   Listeria monocytogenes NOT DETECTED NOT DETECTED Final   Staphylococcus species NOT DETECTED NOT DETECTED Final   Staphylococcus aureus (BCID) NOT DETECTED NOT DETECTED Final   Staphylococcus epidermidis NOT DETECTED NOT DETECTED Final   Staphylococcus lugdunensis NOT DETECTED NOT DETECTED Final   Streptococcus species DETECTED (A) NOT DETECTED Final    Comment: Not Enterococcus species, Streptococcus agalactiae, Streptococcus pyogenes, or Streptococcus pneumoniae. CRITICAL RESULT CALLED TO, READ BACK BY AND VERIFIED WITH: JASON ROBBINS 06/19/2022 AT 2147 SRR    Streptococcus agalactiae NOT DETECTED NOT DETECTED Final   Streptococcus pneumoniae NOT DETECTED NOT DETECTED Final   Streptococcus pyogenes NOT DETECTED NOT DETECTED Final   A.calcoaceticus-baumannii NOT DETECTED NOT DETECTED Final   Bacteroides fragilis NOT DETECTED NOT DETECTED Final   Enterobacterales NOT DETECTED NOT DETECTED Final   Enterobacter cloacae complex NOT DETECTED NOT DETECTED Final   Escherichia coli NOT DETECTED NOT DETECTED Final   Klebsiella aerogenes NOT DETECTED NOT DETECTED Final   Klebsiella oxytoca NOT DETECTED NOT DETECTED Final   Klebsiella pneumoniae NOT DETECTED NOT DETECTED Final   Proteus species NOT DETECTED NOT DETECTED Final   Salmonella species NOT DETECTED NOT DETECTED Final   Serratia marcescens NOT DETECTED NOT DETECTED Final   Haemophilus influenzae NOT DETECTED NOT  DETECTED Final   Neisseria meningitidis NOT DETECTED NOT DETECTED Final   Pseudomonas aeruginosa NOT DETECTED NOT DETECTED Final   Stenotrophomonas maltophilia NOT DETECTED NOT DETECTED Final   Candida albicans NOT DETECTED NOT DETECTED Final   Candida auris NOT DETECTED NOT DETECTED Final   Candida glabrata NOT DETECTED NOT DETECTED Final   Candida krusei NOT DETECTED NOT DETECTED Final   Candida parapsilosis NOT DETECTED NOT DETECTED Final   Candida tropicalis NOT DETECTED NOT DETECTED Final   Cryptococcus neoformans/gattii NOT DETECTED NOT DETECTED Final    Comment: Performed at [facility], [address] [place], [place] [address]  Blood Culture (routine x 2)     Status: Abnormal   Collection Time: 06/19/22 10:49 AM   Specimen: BLOOD  Result Value Ref Range Status   Specimen Description   Final    BLOOD RIGHT [facility] Performed at [facility], [address], [place],  [address]    Special Requests   Final  BOTTLES DRAWN AEROBIC AND ANAEROBIC Blood Culture adequate volume Performed at [facility], [address] [place], [place] [address]    Culture  Setup Time   Final    IN BOTH AEROBIC AND ANAEROBIC BOTTLES GRAM POSITIVE COCCI CRITICAL RESULT CALLED TO, READ BACK BY AND VERIFIED WITH: JASON ROBBINS 06/19/2022 AT 2147 SRR    Culture (A)  Final    STREPTOCOCCUS GALLOLYTICUS SUSCEPTIBILITIES PERFORMED ON PREVIOUS CULTURE WITHIN THE LAST 5 DAYS. Performed at [facility], [place] [address] [place], [place] [address]    Report Status 06/22/2022 FINAL  Final  Culture, blood (Routine X 2) w Reflex to ID Panel     Status: None   Collection Time: 06/19/22  7:40 PM   Specimen: BLOOD  Result Value Ref Range Status   Specimen Description BLOOD BLOOD RIGHT HAND  Final   Special Requests   Final    BOTTLES DRAWN AEROBIC ONLY Blood Culture results may not be optimal due to an inadequate volume of blood received in culture bottles   Culture    Final    NO GROWTH 5 DAYS Performed at [facility], [address], [place], [place] [address]    Report Status 06/24/2022 FINAL  Final  Culture, blood (Routine X 2) w Reflex to ID Panel     Status: None   Collection Time: 06/19/22  7:40 PM   Specimen: BLOOD  Result Value Ref Range Status   Specimen Description BLOOD THUMB RIGHT  Final   Special Requests IN PEDIATRIC BOTTLE Blood Culture adequate volume  Final   Culture   Final    NO GROWTH 5 DAYS Performed at [facility], [address] [place], [place] [address]    Report Status 06/24/2022 FINAL  Final  Cath Tip Culture     Status: Abnormal   Collection Time: 06/20/22 11:24 AM   Specimen: Catheter Tip; Other  Result Value Ref Range Status   Specimen Description   Final    CATH TIP Performed at [facility], [address] [place], [place] [address]    Special Requests   Final    NONE Performed at [facility], [address] [place], [place] [address]    Culture STREPTOCOCCUS GALLOLYTICUS (A)  Final   Report Status 06/24/2022 FINAL  Final   Organism ID, Bacteria STREPTOCOCCUS GALLOLYTICUS  Final      Susceptibility   Streptococcus gallolyticus - MIC*    PENICILLIN 0.12 SENSITIVE Sensitive     CEFTRIAXONE <=0.12 SENSITIVE Sensitive     ERYTHROMYCIN >=8 RESISTANT Resistant     LEVOFLOXACIN 2 SENSITIVE Sensitive     VANCOMYCIN 0.5 SENSITIVE Sensitive     * STREPTOCOCCUS GALLOLYTICUS  Culture, blood (Routine X 2) w Reflex to ID Panel     Status: None (Preliminary result)   Collection Time: 06/23/22  1:34 PM   Specimen: BLOOD  Result Value Ref Range Status   Specimen Description BLOOD BLOOD RIGHT FOREARM  Final   Special Requests   Final    BOTTLES DRAWN AEROBIC AND ANAEROBIC Blood Culture results may not be optimal due to an excessive volume of blood received in culture bottles   Culture   Final    NO GROWTH < 24 HOURS Performed at [facility], [address]  [place], [place] [address]    Report Status PENDING  Incomplete  Culture, blood (Routine X 2) w Reflex to ID Panel     Status: None (Preliminary  result)   Collection Time: 06/23/22  1:39 PM   Specimen: BLOOD  Result Value Ref Range Status   Specimen Description BLOOD BLOOD RIGHT HAND  Final   Special Requests   Final    BOTTLES DRAWN AEROBIC AND ANAEROBIC Blood Culture adequate volume   Culture   Final    NO GROWTH < 24 HOURS Performed at [facility], [address], [place], [place] [address]    Report Status PENDING  Incomplete    Coagulation Studies: No results for input(s): "LABPROT", "INR" in the last 72 hours.   Urinalysis: No results for input(s): "COLORURINE", "LABSPEC", "PHURINE", "GLUCOSEU", "HGBUR", "BILIRUBINUR", "KETONESUR", "PROTEINUR", "UROBILINOGEN", "NITRITE", "LEUKOCYTESUR" in the last 72 hours.  Invalid input(s): "APPERANCEUR"    Imaging: No results found.   Medications:    anticoagulant sodium citrate     [START ON 06/26/2022]  ceFAZolin (ANCEF) IV      amLODipine  10 mg Oral q morning   calcium acetate  1,334 mg Oral TID WC   carbamazepine  1,000 mg Oral q morning   Chlorhexidine Gluconate Cloth  6 each Topical Q0600   doxazosin  4 mg Oral QHS   fluPHENAZine  2.5 mg Oral Daily   guaiFENesin-dextromethorphan  10 mL Oral TID   heparin  5,000 Units Subcutaneous Q8H   hydrALAZINE  100 mg Oral TID   isosorbide mononitrate  60 mg Oral BID   levothyroxine  50 mcg Oral Q0600   minoxidil  2.5 mg Oral Daily   pantoprazole  40 mg Oral QAC breakfast   sodium chloride flush  10-40 mL Intracatheter Q12H   sodium chloride flush  3 mL Intravenous Q12H   trihexyphenidyl  2 mg Oral BID   acetaminophen **OR** acetaminophen, alteplase, anticoagulant sodium citrate, heparin, hydrALAZINE, ipratropium-albuterol, lidocaine (PF), lidocaine-prilocaine, pentafluoroprop-tetrafluoroeth, polyethylene glycol  Assessment/ Plan:  [name] is a 62 y.o.  male  ith past medical conditions including COPD, hypertension, DVT, schizophrenia, and end-stage renal disease on hemodialysis.  Patient presents to the emergency department with somnolence. Patient has been admitted for ESRD on dialysis ([place]) [N18.6, Z99.2] Acute encephalopathy [G93.40] Altered mental status, unspecified altered mental status type [R41.82] Sepsis with encephalopathy without septic shock, due to unspecified organism ([place]) [A41.9, R65.20, G93.41] Bacteremia [R78.81]  [facility]/MWF/left aVF  End stage renal disease on hemodialysis. Patient received HD yesterday. Streptococcus was detected and vascular removed CVC on 06/21/22.   Receiving dialysis today, UF goal 1 L as tolerated.  Next treatment scheduled for Friday.  2. Anemia of chronic kidney disease Lab Results  Component Value Date   HGB 9.1 (L) 06/24/2022  Patient receives Mircera in outpatient clinic. Hgb within desired range.  Will continue to monitor.  3. Secondary Hyperparathyroidism: with outpatient labs: PTH 459, phosphorus 3.4, calcium 9.7 on 06/08/22.   Lab Results  Component Value Date   PTH 194 (H) 05/11/2017   CALCIUM 8.7 (L) 06/24/2022   CAION 1.19 03/27/2022   PHOS 5.0 (H) 06/22/2022  Calcium and phosphorus within desired goal.  Continue calcium acetate with meals.   4.  Hypertension with chronic kidney disease.  Home regimen includes amlodipine, clonidine, hydralazine, irbesartan, isosorbide, doxazosin and minoxidil.  Clonidine currently held.  Blood pressure 122/76.  5. 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 Prescribed cefazoline. Will require treatment beyond discharge. Current recommendation for cefazolin 2g/2g/2g with dialysis.  Awaiting TEE.   LOS: 5   1/24/202411:32 AM

## 2022-06-24 NOTE — Progress Notes (Signed)
Pharmacy - Brief Note  Cefazolin 2gm IV given this morning prior to patient receiving dialysis today  Since cefazolin is dialyzed, will give a supplemental dose of 1gm IV x 1 this afternoon and adjust future cefazolin orders to cefazolin 2gm IV MWF with HD  Doreene Eland, PharmD, BCPS, BCIDP Work Cell: [phone] 06/24/2022 1:17 PM

## 2022-06-24 NOTE — Progress Notes (Addendum)
  Progress Note   Patient: Todd Brown PRX:[id number] DOB: [birth year] DOA: 06/19/2022     5 DOS: the patient was seen and examined on 06/24/2022   Brief hospital course:  Assessment and Plan: * SIRS (systemic inflammatory response syndrome) (HCC)/sepsis due to dialysis catheter  - IV cefazolin 2g MWF with dialysis for strep gallolyticus bacteremia  - Spoke with guardian for consent for TEE and colonoscopy (this remains pending)   NSTEMI (non-ST elevated myocardial infarction) ([place]) - Monitor   Acute encephalopathy - Pt appears at his baseline  - Tegretol 1000 mg PO daily  - Synthroid 50 mcg PO daily   ESRD on dialysis ([place]) - Dialysis per nephrology  - Phoslo 1334 mg PO tid  - Artane 2 mg PO bid   Essential hypertension - Norvasc 10 mg PO daily  - Hydralazine 100 mg PO tid  - Imdur 60 mg PO bid  - Doxazosin 4 mg PO daily  - Minoxidil 2.5 mg PO daily   COPD (chronic obstructive pulmonary disease) (HCC) - Duoneb q6 hr PRN  - Robitussin DM tid for 3 days  Acute pulmonary edema - Dialysis as above   DVT prophylaxis: Heparin 5000 units sq q8hr  GI prophylaxis: Protonix 40 mg PO daily      Subjective: Pt seen and examined at the bedside. Spoke with the pt today and he does not wish to have either the TEE or colonoscopy.  Called and spoke with Todd Brown who is going to discuss with her team regarding whether or not to provide consent for the aforementioned procedures. This remains pending at this time.   Physical Exam: Vitals:   06/24/22 1102 06/24/22 1109 06/24/22 1119 06/24/22 1154  BP: 131/75 133/80  (!) 143/77  Pulse: 71 74  76  Resp: 17 16  17   Temp: 98.9 F (37.2 C)   97.8 F (36.6 C)  TempSrc: Oral     SpO2: 99% 97%  98%  Weight:   76.3 kg    Physical Exam HENT:     Head: Normocephalic and atraumatic.  Cardiovascular:     Rate and Rhythm: Normal rate and regular rhythm.  Pulmonary:     Effort: Pulmonary effort is normal.  Abdominal:      Palpations: Abdomen is soft.  Skin:    General: Skin is warm.  Neurological:     Mental Status: He is alert.     Comments: Awake and alert   Psychiatric:        Mood and Affect: Mood normal.     Data Reviewed:   Disposition: Status is: Inpatient  Planned Discharge Destination: Skilled nursing facility    Time spent: 35 minutes  Author: Lucienne Minks , MD 06/24/2022 3:59 PM  For on call review [email or web address].

## 2022-06-25 DIAGNOSIS — R6510 Systemic inflammatory response syndrome (SIRS) of non-infectious origin without acute organ dysfunction: Secondary | ICD-10-CM | POA: Diagnosis not present

## 2022-06-25 LAB — CBC
HCT: 24.7 % — ABNORMAL LOW (ref 39.0–52.0)
Hemoglobin: 8.1 g/dL — ABNORMAL LOW (ref 13.0–17.0)
MCH: 33.6 pg (ref 26.0–34.0)
MCHC: 32.8 g/dL (ref 30.0–36.0)
MCV: 102.5 fL — ABNORMAL HIGH (ref 80.0–100.0)
Platelets: 139 10*3/uL — ABNORMAL LOW (ref 150–400)
RBC: 2.41 MIL/uL — ABNORMAL LOW (ref 4.22–5.81)
RDW: 16 % — ABNORMAL HIGH (ref 11.5–15.5)
WBC: 6.3 10*3/uL (ref 4.0–10.5)
nRBC: 0 % (ref 0.0–0.2)

## 2022-06-25 LAB — BASIC METABOLIC PANEL
Anion gap: 9 (ref 5–15)
BUN: 34 mg/dL — ABNORMAL HIGH (ref 8–23)
CO2: 29 mmol/L (ref 22–32)
Calcium: 8.6 mg/dL — ABNORMAL LOW (ref 8.9–10.3)
Chloride: 96 mmol/L — ABNORMAL LOW (ref 98–111)
Creatinine, Ser: 4.54 mg/dL — ABNORMAL HIGH (ref 0.61–1.24)
GFR, Estimated: 14 mL/min — ABNORMAL LOW (ref >60)
Glucose, Bld: 91 mg/dL (ref 70–99)
Potassium: 4.4 mmol/L (ref 3.5–5.1)
Sodium: 134 mmol/L — ABNORMAL LOW (ref 135–145)

## 2022-06-25 LAB — MAGNESIUM: Magnesium: 1.7 mg/dL (ref 1.7–2.4)

## 2022-06-25 NOTE — Progress Notes (Signed)
Pt non-compliant with oxygen nasal canula. Pt told this nurse to leave him alone when this nurse tried to apply the nasal canula back in his nose.

## 2022-06-25 NOTE — Plan of Care (Signed)
  Problem: Safety: Goal: Ability to remain free from injury will improve Outcome: Progressing   Problem: Skin Integrity: Goal: Risk for impaired skin integrity will decrease Outcome: Progressing   Problem: Elimination: Goal: Will not experience complications related to bowel motility Outcome: Progressing   Problem: Pain Managment: Goal: General experience of comfort will improve Outcome: Progressing   Problem: Education: Goal: Knowledge of General Education information will improve Description: Including pain rating scale, medication(s)/side effects and non-pharmacologic comfort measures Outcome: Progressing   Problem: Clinical Measurements: Goal: Ability to maintain clinical measurements within normal limits will improve Outcome: Progressing

## 2022-06-25 NOTE — Progress Notes (Signed)
[facility]  ROUNDING NOTE   Subjective:   Todd Brown is a 62 year old male with past medical conditions including COPD, hypertension, DVT, schizophrenia, and end-stage renal disease on hemodialysis.  Patient presents to the emergency department with somnolence. Patient has been admitted for ESRD on dialysis ([place]) [N18.6, Z99.2] Acute encephalopathy [G93.40] Altered mental status, unspecified altered mental status type [R41.82] Sepsis with encephalopathy without septic shock, due to unspecified organism ([place]) [A41.9, R65.20, G93.41] Bacteremia [R78.81]  Patient is known to our practice and receives outpatient dialysis treatments at [facility] on a MWF schedule, supervised by Dr. Holley Raring.    Patient seen sitting up in bed, attempting to eat breakfast Denies pain or discomfort Denies nausea or vomiting Requesting assistance with meals    Objective:  Vital signs in last 24 hours:  Temp:  [97.8 F (36.6 C)-98.2 F (36.8 C)] 98.2 F (36.8 C) (01/25 0817) Pulse Rate:  [65-79] 65 (01/25 0817) Resp:  [16-18] 16 (01/25 0817) BP: (121-180)/(65-84) 180/84 (01/25 0817) SpO2:  [95 %-98 %] 97 % (01/25 0817) Weight:  [77.3 kg] 77.3 kg (01/25 0500)  Weight change: 1.7 kg Filed Weights   06/24/22 0751 06/24/22 1119 06/25/22 0500  Weight: 77 kg 76.3 kg 77.3 kg    Intake/Output: I/O last 3 completed shifts: In: 439.3 [P.O.:300; IV Piggyback:139.3] Out: 1000 [Other:1000]   Intake/Output this shift:  No intake/output data recorded.  Physical Exam: General: NAD  Head: Normocephalic, atraumatic. Moist oral mucosal membranes  Eyes: Anicteric  Lungs:  Clear to auscultation, normal effort, room air  Heart: Regular rate and rhythm  Abdomen:  Soft, tender, mild distension  Extremities:  Trace peripheral edema.  Neurologic: Nonfocal, moving all four extremities  Skin: No lesions  Access: Lt AVF    Basic Metabolic Panel: Recent Labs  Lab 06/21/22 0649  06/22/22 0512 06/23/22 0540 06/24/22 0216 06/25/22 0155  NA 134* 135 134* 136 134*  K 4.8 5.3* 4.8 4.6 4.4  CL 97* 97* 96* 97* 96*  CO2 25 25 27 27 29   GLUCOSE 80 91 81 86 91  BUN 47* 60* 56* 40* 34*  CREATININE 5.96* 7.11* 6.81* 5.10* 4.54*  CALCIUM 9.0 8.7* 8.5* 8.7* 8.6*  MG 1.9 2.0 1.9 1.9 1.7  PHOS  --  5.0*  --   --   --      Liver Function Tests: Recent Labs  Lab 06/19/22 1039 06/20/22 1120  AST 37 47*  ALT 25 38  ALKPHOS 98 88  BILITOT 1.0 0.8  PROT 7.3 6.6  ALBUMIN 4.0 3.5    No results for input(s): "LIPASE", "AMYLASE" in the last 168 hours. No results for input(s): "AMMONIA" in the last 168 hours.  CBC: Recent Labs  Lab 06/19/22 1039 06/20/22 1120 06/21/22 0649 06/22/22 0512 06/23/22 0540 06/24/22 0216 06/25/22 0155  WBC 14.3* 13.7* 10.8* 9.0 5.5 6.0 6.3  NEUTROABS 12.8* 12.2*  --   --   --   --   --   HGB 9.8* 8.9* 8.1* 8.6* 8.5* 9.1* 8.1*  HCT 30.5* 26.7* 24.4* 25.6* 25.6* 27.2* 24.7*  MCV 106.6* 102.3* 103.8* 102.4* 102.0* 102.6* 102.5*  PLT 163 154 128* 143* 136* 144* 139*     Cardiac Enzymes: No results for input(s): "CKTOTAL", "CKMB", "CKMBINDEX", "TROPONINI" in the last 168 hours.  BNP: Invalid input(s): "POCBNP"  CBG: Recent Labs  Lab 06/24/22 [place]*    Microbiology: Results for orders placed or performed during the hospital encounter of 06/19/22  Resp  panel by RT-PCR (RSV, Flu A&B, Covid) Anterior Nasal Swab     Status: None   Collection Time: 06/19/22 10:31 AM   Specimen: Anterior Nasal Swab  Result Value Ref Range Status   SARS Coronavirus 2 by RT PCR NEGATIVE NEGATIVE Final    Comment: (NOTE) SARS-CoV-2 target nucleic acids are NOT DETECTED.  The SARS-CoV-2 RNA is generally detectable in upper respiratory specimens during the acute phase of infection. The lowest concentration of SARS-CoV-2 viral copies this assay can detect is 138 copies/mL. A negative result does not preclude SARS-Cov-2 infection and  should not be used as the sole basis for treatment or other patient management decisions. A negative result may occur with  improper specimen collection/handling, submission of specimen other than nasopharyngeal swab, presence of viral mutation(s) within the areas targeted by this assay, and inadequate number of viral copies(<138 copies/mL). A negative result must be combined with clinical observations, patient history, and epidemiological information. The expected result is Negative.  Fact Sheet for Patients:  EntrepreneurPulse.com.au  Fact Sheet for Healthcare Providers:  IncredibleEmployment.be  This test is no t yet approved or cleared by the [place] FDA and  has been authorized for detection and/or diagnosis of SARS-CoV-2 by FDA under an Emergency Use Authorization (EUA). This EUA will remain  in effect (meaning this test can be used) for the duration of the COVID-19 declaration under Section 564(b)(1) of the Act, 21 U.S.C.section 360bbb-3(b)(1), unless the authorization is terminated  or revoked sooner.       Influenza A by PCR NEGATIVE NEGATIVE Final   Influenza B by PCR NEGATIVE NEGATIVE Final    Comment: (NOTE) The Xpert Xpress SARS-CoV-2/FLU/RSV plus assay is intended as an aid in the diagnosis of influenza from Nasopharyngeal swab specimens and should not be used as a sole basis for treatment. Nasal washings and aspirates are unacceptable for Xpert Xpress SARS-CoV-2/FLU/RSV testing.  Fact Sheet for Patients: EntrepreneurPulse.com.au  Fact Sheet for Healthcare Providers: IncredibleEmployment.be  This test is not yet approved or cleared by the [place] FDA and has been authorized for detection and/or diagnosis of SARS-CoV-2 by FDA under an Emergency Use Authorization (EUA). This EUA will remain in effect (meaning this test can be used) for the duration of the COVID-19 declaration  under Section 564(b)(1) of the Act, 21 U.S.C. section 360bbb-3(b)(1), unless the authorization is terminated or revoked.     Resp Syncytial Virus by PCR NEGATIVE NEGATIVE Final    Comment: (NOTE) Fact Sheet for Patients: EntrepreneurPulse.com.au  Fact Sheet for Healthcare Providers: IncredibleEmployment.be  This test is not yet approved or cleared by the [place] FDA and has been authorized for detection and/or diagnosis of SARS-CoV-2 by FDA under an Emergency Use Authorization (EUA). This EUA will remain in effect (meaning this test can be used) for the duration of the COVID-19 declaration under Section 564(b)(1) of the Act, 21 U.S.C. section 360bbb-3(b)(1), unless the authorization is terminated or revoked.  Performed at [facility], [address] [place], [place] [address]   Blood Culture (routine x 2)     Status: Abnormal   Collection Time: 06/19/22 10:39 AM   Specimen: BLOOD  Result Value Ref Range Status   Specimen Description   Final    BLOOD RIGHT [facility] Performed at [facility], [address], [place], [place] [address]    Special Requests   Final    BOTTLES DRAWN AEROBIC AND ANAEROBIC Blood Culture adequate volume Performed at [facility], [address],  [place], [place] [address]    Culture  Setup Time   Final    Organism ID to follow IN BOTH AEROBIC AND ANAEROBIC BOTTLES GRAM POSITIVE COCCI CRITICAL RESULT CALLED TO, READ BACK BY AND VERIFIED WITH: JASON ROBBINS 06/19/2022 AT 2147 SRR    Culture STREPTOCOCCUS GALLOLYTICUS (A)  Final   Report Status 06/22/2022 FINAL  Final   Organism ID, Bacteria STREPTOCOCCUS GALLOLYTICUS  Final      Susceptibility   Streptococcus gallolyticus - MIC*    PENICILLIN 0.12 SENSITIVE Sensitive     CEFTRIAXONE 0.25 SENSITIVE Sensitive     ERYTHROMYCIN >=8 RESISTANT Resistant     LEVOFLOXACIN 2 SENSITIVE Sensitive     VANCOMYCIN 0.5 SENSITIVE Sensitive      * STREPTOCOCCUS GALLOLYTICUS  Blood Culture ID Panel (Reflexed)     Status: Abnormal   Collection Time: 06/19/22 10:39 AM  Result Value Ref Range Status   Enterococcus faecalis NOT DETECTED NOT DETECTED Final   Enterococcus Faecium NOT DETECTED NOT DETECTED Final   Listeria monocytogenes NOT DETECTED NOT DETECTED Final   Staphylococcus species NOT DETECTED NOT DETECTED Final   Staphylococcus aureus (BCID) NOT DETECTED NOT DETECTED Final   Staphylococcus epidermidis NOT DETECTED NOT DETECTED Final   Staphylococcus lugdunensis NOT DETECTED NOT DETECTED Final   Streptococcus species DETECTED (A) NOT DETECTED Final    Comment: Not Enterococcus species, Streptococcus agalactiae, Streptococcus pyogenes, or Streptococcus pneumoniae. CRITICAL RESULT CALLED TO, READ BACK BY AND VERIFIED WITH: JASON ROBBINS 06/19/2022 AT 2147 SRR    Streptococcus agalactiae NOT DETECTED NOT DETECTED Final   Streptococcus pneumoniae NOT DETECTED NOT DETECTED Final   Streptococcus pyogenes NOT DETECTED NOT DETECTED Final   A.calcoaceticus-baumannii NOT DETECTED NOT DETECTED Final   Bacteroides fragilis NOT DETECTED NOT DETECTED Final   Enterobacterales NOT DETECTED NOT DETECTED Final   Enterobacter cloacae complex NOT DETECTED NOT DETECTED Final   Escherichia coli NOT DETECTED NOT DETECTED Final   Klebsiella aerogenes NOT DETECTED NOT DETECTED Final   Klebsiella oxytoca NOT DETECTED NOT DETECTED Final   Klebsiella pneumoniae NOT DETECTED NOT DETECTED Final   Proteus species NOT DETECTED NOT DETECTED Final   Salmonella species NOT DETECTED NOT DETECTED Final   Serratia marcescens NOT DETECTED NOT DETECTED Final   Haemophilus influenzae NOT DETECTED NOT DETECTED Final   Neisseria meningitidis NOT DETECTED NOT DETECTED Final   Pseudomonas aeruginosa NOT DETECTED NOT DETECTED Final   Stenotrophomonas maltophilia NOT DETECTED NOT DETECTED Final   Candida albicans NOT DETECTED NOT DETECTED Final   Candida auris NOT  DETECTED NOT DETECTED Final   Candida glabrata NOT DETECTED NOT DETECTED Final   Candida krusei NOT DETECTED NOT DETECTED Final   Candida parapsilosis NOT DETECTED NOT DETECTED Final   Candida tropicalis NOT DETECTED NOT DETECTED Final   Cryptococcus neoformans/gattii NOT DETECTED NOT DETECTED Final    Comment: Performed at [facility], [address] [place], [place] [address]  Blood Culture (routine x 2)     Status: Abnormal   Collection Time: 06/19/22 10:49 AM   Specimen: BLOOD  Result Value Ref Range Status   Specimen Description   Final    BLOOD RIGHT [facility] Performed at [facility], [address], [place], [place] [address]    Special Requests   Final    BOTTLES DRAWN AEROBIC AND ANAEROBIC Blood Culture adequate volume Performed at [name], [address], [place],  [address]    Culture  Setup Time   Final    IN  BOTH AEROBIC AND ANAEROBIC BOTTLES GRAM POSITIVE COCCI CRITICAL RESULT CALLED TO, READ BACK BY AND VERIFIED WITH: JASON ROBBINS 06/19/2022 AT 2147 SRR    Culture (A)  Final    STREPTOCOCCUS GALLOLYTICUS SUSCEPTIBILITIES PERFORMED ON PREVIOUS CULTURE WITHIN THE LAST 5 DAYS. Performed at [facility], [place] [address] [place], [place] [address]    Report Status 06/22/2022 FINAL  Final  Culture, blood (Routine X 2) w Reflex to ID Panel     Status: None   Collection Time: 06/19/22  7:40 PM   Specimen: BLOOD  Result Value Ref Range Status   Specimen Description BLOOD BLOOD RIGHT HAND  Final   Special Requests   Final    BOTTLES DRAWN AEROBIC ONLY Blood Culture results may not be optimal due to an inadequate volume of blood received in culture bottles   Culture   Final    NO GROWTH 5 DAYS Performed at [facility], [address] [place], [place] [address]    Report Status 06/24/2022 FINAL  Final  Culture, blood (Routine X 2) w Reflex to ID Panel     Status: None   Collection Time: 06/19/22  7:40 PM    Specimen: BLOOD  Result Value Ref Range Status   Specimen Description BLOOD THUMB RIGHT  Final   Special Requests IN PEDIATRIC BOTTLE Blood Culture adequate volume  Final   Culture   Final    NO GROWTH 5 DAYS Performed at [facility], [address] [place], [place] [address]    Report Status 06/24/2022 FINAL  Final  Cath Tip Culture     Status: Abnormal   Collection Time: 06/20/22 11:24 AM   Specimen: Catheter Tip; Other  Result Value Ref Range Status   Specimen Description   Final    CATH TIP Performed at [facility], [address] [place], [place] [address]    Special Requests   Final    NONE Performed at [facility], [address] [place], [place] [address]    Culture STREPTOCOCCUS GALLOLYTICUS (A)  Final   Report Status 06/24/2022 FINAL  Final   Organism ID, Bacteria STREPTOCOCCUS GALLOLYTICUS  Final      Susceptibility   Streptococcus gallolyticus - MIC*    PENICILLIN 0.12 SENSITIVE Sensitive     CEFTRIAXONE <=0.12 SENSITIVE Sensitive     ERYTHROMYCIN >=8 RESISTANT Resistant     LEVOFLOXACIN 2 SENSITIVE Sensitive     VANCOMYCIN 0.5 SENSITIVE Sensitive     * STREPTOCOCCUS GALLOLYTICUS  Culture, blood (Routine X 2) w Reflex to ID Panel     Status: None (Preliminary result)   Collection Time: 06/23/22  1:34 PM   Specimen: BLOOD  Result Value Ref Range Status   Specimen Description BLOOD BLOOD RIGHT FOREARM  Final   Special Requests   Final    BOTTLES DRAWN AEROBIC AND ANAEROBIC Blood Culture results may not be optimal due to an excessive volume of blood received in culture bottles   Culture   Final    NO GROWTH 2 DAYS Performed at [facility], [address], [place], [place] [address]    Report Status PENDING  Incomplete  Culture, blood (Routine X 2) w Reflex to ID Panel     Status: None (Preliminary result)   Collection Time: 06/23/22  1:39 PM   Specimen: BLOOD  Result Value Ref Range Status   Specimen Description  BLOOD BLOOD RIGHT HAND  Final   Special Requests   Final  BOTTLES DRAWN AEROBIC AND ANAEROBIC Blood Culture adequate volume   Culture   Final    NO GROWTH 2 DAYS Performed at [facility], [address] [place], [place] [address]    Report Status PENDING  Incomplete    Coagulation Studies: No results for input(s): "LABPROT", "INR" in the last 72 hours.   Urinalysis: No results for input(s): "COLORURINE", "LABSPEC", "PHURINE", "GLUCOSEU", "HGBUR", "BILIRUBINUR", "KETONESUR", "PROTEINUR", "UROBILINOGEN", "NITRITE", "LEUKOCYTESUR" in the last 72 hours.  Invalid input(s): "APPERANCEUR"    Imaging: No results found.   Medications:    anticoagulant sodium citrate     [START ON 06/26/2022]  ceFAZolin (ANCEF) IV      amLODipine  10 mg Oral q morning   calcium acetate  1,334 mg Oral TID WC   carbamazepine  1,000 mg Oral q morning   Chlorhexidine Gluconate Cloth  6 each Topical Q0600   doxazosin  4 mg Oral QHS   fluPHENAZine  2.5 mg Oral Daily   guaiFENesin-dextromethorphan  10 mL Oral TID   heparin  5,000 Units Subcutaneous Q8H   hydrALAZINE  100 mg Oral TID   isosorbide mononitrate  60 mg Oral BID   levothyroxine  50 mcg Oral Q0600   minoxidil  2.5 mg Oral Daily   pantoprazole  40 mg Oral QAC breakfast   sodium chloride flush  10-40 mL Intracatheter Q12H   sodium chloride flush  3 mL Intravenous Q12H   trihexyphenidyl  2 mg Oral BID   acetaminophen **OR** acetaminophen, alteplase, anticoagulant sodium citrate, heparin, hydrALAZINE, ipratropium-albuterol, lidocaine (PF), lidocaine-prilocaine, pentafluoroprop-tetrafluoroeth, polyethylene glycol  Assessment/ Plan:  [name] is a 62 y.o.  male ith past medical conditions including COPD, hypertension, DVT, schizophrenia, and end-stage renal disease on hemodialysis.  Patient presents to the emergency department with somnolence. Patient has been admitted for ESRD on dialysis ([place]) [N18.6, Z99.2] Acute  encephalopathy [G93.40] Altered mental status, unspecified altered mental status type [R41.82] Sepsis with encephalopathy without septic shock, due to unspecified organism ([place]) [A41.9, R65.20, G93.41] Bacteremia [R78.81]  [facility] [place]/MWF/left aVF  End stage renal disease on hemodialysis. Patient received HD yesterday. Streptococcus was detected and vascular removed CVC on 06/21/22.   Dialysis received yesterday, UF 1 L achieved.  Next treatment scheduled for Friday.  2. Anemia of chronic kidney disease Lab Results  Component Value Date   HGB 8.1 (L) 06/25/2022  Patient receives Mircera in outpatient clinic. Hemoglobin remains decreased.  Will order EPO with dialysis treatments.  3. Secondary Hyperparathyroidism: with outpatient labs: PTH 459, phosphorus 3.4, calcium 9.7 on 06/08/22.   Lab Results  Component Value Date   PTH 194 (H) 05/11/2017   CALCIUM 8.6 (L) 06/25/2022   CAION 1.19 03/27/2022   PHOS 5.0 (H) 06/22/2022  Calcium remains acceptable.  Continue calcium acetate with meals.   4.  Hypertension with chronic kidney disease.  Home regimen includes amlodipine, clonidine, hydralazine, irbesartan, isosorbide, doxazosin and minoxidil.  Clonidine currently held.  Blood pressure 180/84.  5. 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 Prescribed cefazoline. Will require treatment beyond discharge. Current recommendation for cefazolin 2g/2g/2g with dialysis.  Awaiting TEE authorization.   LOS: 6   1/25/202412:30 PM

## 2022-06-25 NOTE — Progress Notes (Signed)
  Progress Note   Patient: Todd Brown EQA:[id number] DOB: Nov 05, 1960 DOA: 06/19/2022     6 DOS: the patient was seen and examined on 06/25/2022   Brief hospital course:  Assessment and Plan: * SIRS (systemic inflammatory response syndrome) (HCC)/sepsis due to dialysis catheter  - IV cefazolin 2g MWF with dialysis for strep gallolyticus bacteremia  - Spoke with guardian for consent for TEE and colonoscopy on 06/24/2022 but decision has not been made yet    NSTEMI (non-ST elevated myocardial infarction) ([place]) - Monitor    Acute encephalopathy - Pt appears at his baseline  - Tegretol 1000 mg PO daily  - Synthroid 50 mcg PO daily    ESRD on dialysis ([place]) - Dialysis per nephrology  - Phoslo 1334 mg PO tid  - Artane 2 mg PO bid    Essential hypertension - Norvasc 10 mg PO daily  - Hydralazine 100 mg PO tid  - Imdur 60 mg PO bid  - Doxazosin 4 mg PO daily  - Minoxidil 2.5 mg PO daily    COPD (chronic obstructive pulmonary disease) (HCC) - Duoneb q6 hr PRN  - Robitussin DM tid for 3 days   Acute pulmonary edema - Dialysis as above    DVT prophylaxis: Heparin 5000 units sq q8hr  GI prophylaxis: Protonix 40 mg PO daily       Subjective: Pt seen and examined at the bedside. His mentation appears to be at baseline. Spoke with the guardian yesterday (06/24/2022) and am waiting to hear back with the decision regarding TEE and colonoscopy (endoscopic evaluation).  Physical Exam: Vitals:   06/24/22 2005 06/24/22 2347 06/25/22 0500 06/25/22 0817  BP: 138/74 (!) 173/73  (!) 180/84  Pulse: 75 68  65  Resp: 18 18  16   Temp: 98 F (36.7 C) 97.8 F (36.6 C)  98.2 F (36.8 C)  TempSrc:      SpO2: 97% 98%  97%  Weight:   77.3 kg    Physical Exam HENT:     Head: Normocephalic and atraumatic.  Cardiovascular:     Rate and Rhythm: Normal rate and regular rhythm.  Pulmonary:     Effort: Pulmonary effort is normal.  Abdominal:     Palpations: Abdomen is soft.  Skin:     General: Skin is warm.  Neurological:     Mental Status: He is alert.     Comments: Awake and alert   Psychiatric:        Mood and Affect: Mood normal.   Data Reviewed:   Disposition: Status is: Inpatient  Planned Discharge Destination: Skilled nursing facility    Time spent: 35 minutes  Author: Lucienne Minks , MD 06/25/2022 12:05 PM  For on call review [email or web address].

## 2022-06-25 NOTE — Progress Notes (Signed)
PHARMACY CONSULT NOTE FOR:  OUTPATIENT  PARENTERAL ANTIBIOTIC THERAPY (OPAT).    To be given at hemodialysis facility  Indication: S. Gallolyticus bacteremia Regimen: Cefazolin 2gm IV after Hemodialysis on MWF End date: 07/20/2022  IV antibiotic discharge orders are pended. To discharging provider:  please sign these orders via discharge navigator,  Select New Orders & click on the button choice - Manage This Unsigned Work.     Thank you for allowing pharmacy to be a part of this patient's care.  Doreene Eland, PharmD, BCPS, BCIDP Work Cell: [phone] 06/25/2022 2:19 PM

## 2022-06-25 NOTE — Progress Notes (Signed)
[facility] for Infectious Disease    Date of Admission:  06/19/2022   Total days of antibiotics   ID: Todd Brown is a 62 y.o. 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 Still not interested in TEE or colonoscopy. Tolerated HD without difficulty.  Difficult to understand everything he is saying - at baseline  Medications:   amLODipine  10 mg Oral q morning   calcium acetate  1,334 mg Oral TID WC   carbamazepine  1,000 mg Oral q morning   Chlorhexidine Gluconate Cloth  6 each Topical Q0600   doxazosin  4 mg Oral QHS   fluPHENAZine  2.5 mg Oral Daily   guaiFENesin-dextromethorphan  10 mL Oral TID   heparin  5,000 Units Subcutaneous Q8H   hydrALAZINE  100 mg Oral TID   isosorbide mononitrate  60 mg Oral BID   levothyroxine  50 mcg Oral Q0600   minoxidil  2.5 mg Oral Daily   pantoprazole  40 mg Oral QAC breakfast   sodium chloride flush  10-40 mL Intracatheter Q12H   sodium chloride flush  3 mL Intravenous Q12H   trihexyphenidyl  2 mg Oral BID    Objective: Vital signs in last 24 hours: Temp:  [97.8 F (36.6 C)-98.2 F (36.8 C)] 98.2 F (36.8 C) (01/25 0817) Pulse Rate:  [65-79] 65 (01/25 0817) Resp:  [16-18] 16 (01/25 0817) BP: (121-180)/(65-84) 180/84 (01/25 0817) SpO2:  [95 %-98 %] 97 % (01/25 0817) Weight:  [77.3 kg] 77.3 kg (01/25 0500)  Physical Exam  Constitutional: He is oriented to person, place, and time. He appears well-developed and well-nourished. No distress.  HENT:  Mouth/Throat: Oropharynx is clear and moist. No oropharyngeal exudate.  Cardiovascular: Normal rate, regular rhythm and normal heart  sounds. Exam reveals no gallop and no friction rub.  No murmur heard.  Pulmonary/Chest: Effort normal and breath sounds normal. No respiratory distress. He has no wheezes.  Abdominal: Soft. Bowel sounds are normal. He exhibits no distension. There is no tenderness.  Lymphadenopathy:  He has no cervical adenopathy.  Neurological: He is alert and oriented to person, place, and time.  Skin: Skin is warm and dry. No rash noted. No erythema.  Psychiatric: He has a normal mood and affect. His behavior is normal.    Lab Results Recent Labs    06/24/22 0216 06/25/22 0155  WBC 6.0 6.3  HGB 9.1* 8.1*  HCT 27.2* 24.7*  NA 136 134*  K 4.6 4.4  CL 97* 96*  CO2 27 29  BUN 40* 34*  CREATININE 5.10* 4.54*    Microbiology: 1/23 blood cx ngtd 1/20 cath tip strep gallolyticus 1/19 blood cx strep gallolyticus     Streptococcus gallolyticus    MIC    CEFTRIAXONE <=0.12 SENS... Sensitive    ERYTHROMYCIN >=8 RESISTANT Resistant    LEVOFLOXACIN 2 SENSITIVE Sensitive    PENICILLIN 0.12 SENSIT... Sensitive    VANCOMYCIN 0.5 SENSITIVE Sensitive    Studies/Results: No results found.   06/20/22 ECHOCARDIOGRAM REPORT       Patient Name:   Todd Brown Date of Exam: 06/20/2022  Medical Rec #:  [id number]  Height:       71.0 in  Accession #:    [id number]       Weight:       169.3 lb  Date of Birth:  1960-06-11        BSA:          1.964 m     1. Left ventricular ejection fraction, by estimation, is 60 to 65%. The  left ventricle has normal function. The left ventricle has no regional  wall motion abnormalities. There is mild left ventricular hypertrophy.  Left ventricular diastolic parameters  are consistent with Grade I diastolic dysfunction (impaired relaxation).   2. Right ventricular systolic function is normal. The right ventricular  size is normal. There is severely elevated pulmonary artery systolic  pressure.   3. The mitral valve is normal in structure. No evidence of  mitral valve  regurgitation. No evidence of mitral stenosis.   4. Tricuspid valve regurgitation is moderate to severe.   5. The aortic valve is normal in structure. Aortic valve regurgitation is  mild to moderate. No aortic stenosis is present.   6. The inferior vena cava is normal in size with greater than 50%  respiratory variability, suggesting right atrial pressure of 3 mmHg.   FINDINGS   Left Ventricle: Left ventricular ejection fraction, by estimation, is 60  to 65%. The left ventricle has normal function. The left ventricle has no  regional wall motion abnormalities. The left ventricular internal cavity  size was normal in size. There is   mild left ventricular hypertrophy. Left ventricular diastolic parameters  are consistent with Grade I diastolic dysfunction (impaired relaxation).   Right Ventricle: The right ventricular size is normal. No increase in  right ventricular wall thickness. Right ventricular systolic function is  normal. There is severely elevated pulmonary artery systolic pressure. The  tricuspid regurgitant velocity is  4.64 m/s, and with an assumed right atrial pressure of 15 mmHg, the  estimated right ventricular systolic pressure is 426.8 mmHg.   Left Atrium: Left atrial size was normal in size.   Right Atrium: Right atrial size was normal in size.   Pericardium: Trivial pericardial effusion is present.   Mitral Valve: The mitral valve is normal in structure. No evidence of  mitral valve regurgitation. No evidence of mitral valve stenosis.   Tricuspid Valve: The tricuspid valve is normal in structure. Tricuspid  valve regurgitation is moderate to severe. No evidence of tricuspid  stenosis.   Aortic Valve: The aortic valve is normal in structure. Aortic valve  regurgitation is mild to moderate. Aortic regurgitation PHT measures 508  msec. No aortic stenosis is present. Aortic valve mean gradient measures  9.0 mmHg. Aortic valve peak gradient  measures  16.2 mmHg. Aortic valve area, by VTI measures 4.10 cm.   Pulmonic Valve: The pulmonic valve was normal in structure. Pulmonic valve  regurgitation is not visualized. No evidence of pulmonic stenosis.   Aorta: The aortic root is normal in size and structure.   Venous: The inferior vena cava is normal in size with greater than 50%  respiratory variability, suggesting right atrial pressure of 3 mmHg.   IAS/Shunts: No atrial level shunt detected by color flow Doppler.       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 At completion of abtx recommend to repeat blood cx 2 weeks thereafter. If recurrence, then would need to get colonoscopy  Diagnosis: Strep gallolyticus bacteremia  Culture Result: streptococcus gallolyticus  Allergies  Allergen Reactions   Chlorpromazine Other (See Comments)    Reaction:  Unknown , pt states it makes him feel real bad Reaction:  Unknown , pt states it makes him feel real bad     OPAT Orders Discharge antibiotics to be given via PICC line Discharge antibiotics: Per pharmacy protocol 28 days of cefazolin 2gm per HD schedule  Duration: 28 days End Date: February 19th    [facility] for Infectious Diseases Pager: [phone]  06/25/2022, 12:24 PM

## 2022-06-25 NOTE — Plan of Care (Signed)
  Problem: Clinical Measurements: Goal: Diagnostic test results will improve Outcome: Progressing Goal: Cardiovascular complication will be avoided Outcome: Progressing   Problem: Nutrition: Goal: Adequate nutrition will be maintained Outcome: Progressing

## 2022-06-26 LAB — CBC
HCT: 25 % — ABNORMAL LOW (ref 39.0–52.0)
Hemoglobin: 8.2 g/dL — ABNORMAL LOW (ref 13.0–17.0)
MCH: 33.5 pg (ref 26.0–34.0)
MCHC: 32.8 g/dL (ref 30.0–36.0)
MCV: 102 fL — ABNORMAL HIGH (ref 80.0–100.0)
Platelets: 158 10*3/uL (ref 150–400)
RBC: 2.45 MIL/uL — ABNORMAL LOW (ref 4.22–5.81)
RDW: 16.1 % — ABNORMAL HIGH (ref 11.5–15.5)
WBC: 5.1 10*3/uL (ref 4.0–10.5)
nRBC: 0 % (ref 0.0–0.2)

## 2022-06-26 LAB — COMPREHENSIVE METABOLIC PANEL
ALT: 7 U/L (ref 0–44)
AST: 22 U/L (ref 15–41)
Albumin: 2.9 g/dL — ABNORMAL LOW (ref 3.5–5.0)
Alkaline Phosphatase: 66 U/L (ref 38–126)
Anion gap: 9 (ref 5–15)
BUN: 48 mg/dL — ABNORMAL HIGH (ref 8–23)
CO2: 29 mmol/L (ref 22–32)
Calcium: 8.9 mg/dL (ref 8.9–10.3)
Chloride: 96 mmol/L — ABNORMAL LOW (ref 98–111)
Creatinine, Ser: 6.15 mg/dL — ABNORMAL HIGH (ref 0.61–1.24)
GFR, Estimated: 10 mL/min — ABNORMAL LOW (ref >60)
Glucose, Bld: 78 mg/dL (ref 70–99)
Potassium: 5.3 mmol/L — ABNORMAL HIGH (ref 3.5–5.1)
Sodium: 134 mmol/L — ABNORMAL LOW (ref 135–145)
Total Bilirubin: 0.7 mg/dL (ref 0.3–1.2)
Total Protein: 5.7 g/dL — ABNORMAL LOW (ref 6.5–8.1)

## 2022-06-26 LAB — MAGNESIUM: Magnesium: 1.8 mg/dL (ref 1.7–2.4)

## 2022-06-26 LAB — C-REACTIVE PROTEIN: CRP: 2.5 mg/dL — ABNORMAL HIGH (ref <1.0)

## 2022-06-26 MED ORDER — EPOETIN ALFA 10000 UNIT/ML IJ SOLN
INTRAMUSCULAR | Status: AC
  Administered 2022-06-26: 10000 [IU] via INTRAVENOUS
  Filled 2022-06-26: qty 1

## 2022-06-26 MED ORDER — EPOETIN ALFA 10000 UNIT/ML IJ SOLN: 10000.0000 [IU] | INTRAMUSCULAR | Status: DC

## 2022-06-26 NOTE — Progress Notes (Signed)
  Progress Note   Patient: Todd Brown YTK:[id number] DOB: [birth year] DOA: 06/19/2022     7 DOS: the patient was seen and examined on 06/26/2022   Brief hospital course:  Assessment and Plan:  * SIRS (systemic inflammatory response syndrome) (HCC)/sepsis due to dialysis catheter  - IV cefazolin 2g MWF with dialysis for strep gallolyticus bacteremia (END DATE Jul 20 2022) - Spoke with guardian for consent for TEE and colonoscopy on 06/24/2022 but decision has not been made yet. Furthermore, the pt himself is not agreeable for these procedures     NSTEMI (non-ST elevated myocardial infarction) ([place]) - Monitor    Acute encephalopathy - Pt appears at his baseline  - Tegretol 1000 mg PO daily  - Synthroid 50 mcg PO daily    ESRD on dialysis ([place]) - Dialysis per nephrology  - Phoslo 1334 mg PO tid  - Artane 2 mg PO bid    Essential hypertension - Norvasc 10 mg PO daily  - Hydralazine 100 mg PO tid  - Imdur 60 mg PO bid  - Doxazosin 4 mg PO daily  - Minoxidil 2.5 mg PO daily    COPD (chronic obstructive pulmonary disease) (HCC) - Duoneb q6 hr PRN  - Robitussin DM tid for 3 days   Acute pulmonary edema - Dialysis as above    DVT prophylaxis: Heparin 5000 units sq q8hr  GI prophylaxis: Protonix 40 mg PO daily      Subjective: Pt seen and examined at the bedside. Spoke with Dr. Baxter Flattery from ID and the last day of IV antibx will be Jul 20 2022.  Pt not agreeable for TEE or colonoscopy at this time.   Case management will work towards finding a facility that will be able to administer the IV antibx's until Jul 20 2022 (the assisted living facility the patient came from does not appear to have the capability to administer IV antibx's).   Physical Exam: Vitals:   06/26/22 0001 06/26/22 0500 06/26/22 0721 06/26/22 0901  BP: 109/79  (!) 153/65 (!) 149/126  Pulse: 67  (!) 59 64  Resp: 18  15 18   Temp: 97.7 F (36.5 C)  98.1 F (36.7 C) 98.3 F (36.8 C)  TempSrc:    Oral   SpO2: 99%  97% 100%  Weight:  79.2 kg    Height:       HENT:     Head: Normocephalic and atraumatic.  Cardiovascular:     Rate and Rhythm: Normal rate and regular rhythm.  Pulmonary:     Effort: Pulmonary effort is normal.  Abdominal:     Palpations: Abdomen is soft.  Skin:    General: Skin is warm.  Neurological:     Mental Status: He is alert.     Comments: Awake and alert   Psychiatric:        Mood and Affect: Mood normal.  Data Reviewed:   Disposition: Status is: Inpatient  Planned Discharge Destination: Skilled nursing facility    Time spent: 35 minutes  Author: Lucienne Minks , MD 06/26/2022 9:28 AM  For on call review [email or web address].

## 2022-06-26 NOTE — Progress Notes (Signed)
Received patient in bed to unit.  Alert. Informed consent signed and in chart.   Treatment initiated: 0925 Treatment completed: 1300  Patient tolerated well.  Transported back to the room  Alert, without acute distress.  Hand-off given to patient's nurse.   Access used: Left AVF Access issues: none  Total UF removed: 1000 ml Medication(s) given: Ancef and Epogen    [facility]

## 2022-06-26 NOTE — Plan of Care (Signed)
  Problem: Education: Goal: Knowledge of General Education information will improve Description: Including pain rating scale, medication(s)/side effects and non-pharmacologic comfort measures Outcome: Progressing   Problem: Respiratory: Goal: Will maintain a patent airway Outcome: Progressing   Problem: Coping: Goal: Psychosocial and spiritual needs will be supported Outcome: Progressing   Problem: Pain Managment: Goal: General experience of comfort will improve Outcome: Progressing   Problem: Skin Integrity: Goal: Risk for impaired skin integrity will decrease Outcome: Progressing   Problem: Safety: Goal: Ability to remain free from injury will improve Outcome: Progressing

## 2022-06-26 NOTE — Progress Notes (Signed)
[facility]  ROUNDING NOTE   Subjective:   Todd Brown is a 62 year old male with past medical conditions including COPD, hypertension, DVT, schizophrenia, and end-stage renal disease on hemodialysis.  Patient presents to the emergency department with somnolence. Patient has been admitted for ESRD on dialysis ([place]) [N18.6, Z99.2] Acute encephalopathy [G93.40] Altered mental status, unspecified altered mental status type [R41.82] Sepsis with encephalopathy without septic shock, due to unspecified organism ([place]) [A41.9, R65.20, G93.41] Bacteremia [R78.81]  Patient is known to our practice and receives outpatient dialysis treatments at [facility] on a MWF schedule, supervised by Dr. Holley Raring.    Patient seen and evaluated during dialysis   HEMODIALYSIS FLOWSHEET:  Blood Flow Rate (mL/min): 300 mL/min Arterial Pressure (mmHg): -120 mmHg Venous Pressure (mmHg): 190 mmHg TMP (mmHg): 16 mmHg Ultrafiltration Rate (mL/min): 456 mL/min Dialysate Flow Rate (mL/min): 300 ml/min Dialysis Fluid Bolus: Normal Saline Bolus Amount (mL): 300 mL  No complaints at this time    Objective:  Vital signs in last 24 hours:  Temp:  [97.7 F (36.5 C)-98.8 F (37.1 C)] 98.3 F (36.8 C) (01/26 0901) Pulse Rate:  [59-82] 68 (01/26 1200) Resp:  [15-26] 15 (01/26 1200) BP: (109-178)/(65-127) 129/66 (01/26 1200) SpO2:  [97 %-100 %] 100 % (01/26 1200) Weight:  [76.6 kg-79.2 kg] 76.6 kg (01/26 0901)  Weight change: 0.3 kg Filed Weights   06/25/22 0817 06/26/22 0500 06/26/22 0901  Weight: 77.3 kg 79.2 kg 76.6 kg    Intake/Output: No intake/output data recorded.   Intake/Output this shift:  No intake/output data recorded.  Physical Exam: General: NAD  Head: Normocephalic, atraumatic. Moist oral mucosal membranes  Eyes: Anicteric  Lungs:  Clear to auscultation, normal effort, room air  Heart: Regular rate and rhythm  Abdomen:  Soft, tender, mild distension  Extremities:   Trace peripheral edema.  Neurologic: Nonfocal, moving all four extremities  Skin: No lesions  Access: Lt AVF    Basic Metabolic Panel: Recent Labs  Lab 06/22/22 0512 06/23/22 0540 06/24/22 0216 06/25/22 0155 06/26/22 0529  NA 135 134* 136 134* 134*  K 5.3* 4.8 4.6 4.4 5.3*  CL 97* 96* 97* 96* 96*  CO2 25 27 27 29 29   GLUCOSE 91 81 86 91 78  BUN 60* 56* 40* 34* 48*  CREATININE 7.11* 6.81* 5.10* 4.54* 6.15*  CALCIUM 8.7* 8.5* 8.7* 8.6* 8.9  MG 2.0 1.9 1.9 1.7 1.8  PHOS 5.0*  --   --   --   --      Liver Function Tests: Recent Labs  Lab 06/20/22 1120 06/26/22 0529  AST 47* 22  ALT 38 7  ALKPHOS 88 66  BILITOT 0.8 0.7  PROT 6.6 5.7*  ALBUMIN 3.5 2.9*    No results for input(s): "LIPASE", "AMYLASE" in the last 168 hours. No results for input(s): "AMMONIA" in the last 168 hours.  CBC: Recent Labs  Lab 06/20/22 1120 06/21/22 0649 06/22/22 0512 06/23/22 0540 06/24/22 0216 06/25/22 0155 06/26/22 0529  WBC 13.7*   < > 9.0 5.5 6.0 6.3 5.1  NEUTROABS 12.2*  --   --   --   --   --   --   HGB 8.9*   < > 8.6* 8.5* 9.1* 8.1* 8.2*  HCT 26.7*   < > 25.6* 25.6* 27.2* 24.7* 25.0*  MCV 102.3*   < > 102.4* 102.0* 102.6* 102.5* 102.0*  PLT 154   < > 143* 136* 144* 139* 158   < > = values in this  interval not displayed.     Cardiac Enzymes: No results for input(s): "CKTOTAL", "CKMB", "CKMBINDEX", "TROPONINI" in the last 168 hours.  BNP: Invalid input(s): "POCBNP"  CBG: Recent Labs  Lab 06/24/22 1155  GLUCAP 123*     Microbiology: Results for orders placed or performed during the hospital encounter of 06/19/22  Resp panel by RT-PCR (RSV, Flu A&B, Covid) Anterior Nasal Swab     Status: None   Collection Time: 06/19/22 10:31 AM   Specimen: Anterior Nasal Swab  Result Value Ref Range Status   SARS Coronavirus 2 by RT PCR NEGATIVE NEGATIVE Final    Comment: (NOTE) SARS-CoV-2 target nucleic acids are NOT DETECTED.  The SARS-CoV-2 RNA is generally detectable in  upper respiratory specimens during the acute phase of infection. The lowest concentration of SARS-CoV-2 viral copies this assay can detect is 138 copies/mL. A negative result does not preclude SARS-Cov-2 infection and should not be used as the sole basis for treatment or other patient management decisions. A negative result may occur with  improper specimen collection/handling, submission of specimen other than nasopharyngeal swab, presence of viral mutation(s) within the areas targeted by this assay, and inadequate number of viral copies(<138 copies/mL). A negative result must be combined with clinical observations, patient history, and epidemiological information. The expected result is Negative.  Fact Sheet for Patients:  EntrepreneurPulse.com.au  Fact Sheet for Healthcare Providers:  IncredibleEmployment.be  This test is no t yet approved or cleared by the [place] FDA and  has been authorized for detection and/or diagnosis of SARS-CoV-2 by FDA under an Emergency Use Authorization (EUA). This EUA will remain  in effect (meaning this test can be used) for the duration of the COVID-19 declaration under Section 564(b)(1) of the Act, 21 U.S.C.section 360bbb-3(b)(1), unless the authorization is terminated  or revoked sooner.       Influenza A by PCR NEGATIVE NEGATIVE Final   Influenza B by PCR NEGATIVE NEGATIVE Final    Comment: (NOTE) The Xpert Xpress SARS-CoV-2/FLU/RSV plus assay is intended as an aid in the diagnosis of influenza from Nasopharyngeal swab specimens and should not be used as a sole basis for treatment. Nasal washings and aspirates are unacceptable for Xpert Xpress SARS-CoV-2/FLU/RSV testing.  Fact Sheet for Patients: EntrepreneurPulse.com.au  Fact Sheet for Healthcare Providers: IncredibleEmployment.be  This test is not yet approved or cleared by the [place] FDA and has been  authorized for detection and/or diagnosis of SARS-CoV-2 by FDA under an Emergency Use Authorization (EUA). This EUA will remain in effect (meaning this test can be used) for the duration of the COVID-19 declaration under Section 564(b)(1) of the Act, 21 U.S.C. section 360bbb-3(b)(1), unless the authorization is terminated or revoked.     Resp Syncytial Virus by PCR NEGATIVE NEGATIVE Final    Comment: (NOTE) Fact Sheet for Patients: EntrepreneurPulse.com.au  Fact Sheet for Healthcare Providers: IncredibleEmployment.be  This test is not yet approved or cleared by the [place] FDA and has been authorized for detection and/or diagnosis of SARS-CoV-2 by FDA under an Emergency Use Authorization (EUA). This EUA will remain in effect (meaning this test can be used) for the duration of the COVID-19 declaration under Section 564(b)(1) of the Act, 21 U.S.C. section 360bbb-3(b)(1), unless the authorization is terminated or revoked.  Performed at [facility], [address] [place], [place] [address]   Blood Culture (routine x 2)     Status: Abnormal   Collection Time: 06/19/22 10:39 AM   Specimen: BLOOD  Result Value  Ref Range Status   Specimen Description   Final    BLOOD RIGHT AC Performed at [facility], [address] [place], [place] [address]    Special Requests   Final    BOTTLES DRAWN AEROBIC AND ANAEROBIC Blood Culture adequate volume Performed at [facility], [address] [place], [place] [address]    Culture  Setup Time   Final    Organism ID to follow IN BOTH AEROBIC AND ANAEROBIC BOTTLES GRAM POSITIVE COCCI CRITICAL RESULT CALLED TO, READ BACK BY AND VERIFIED WITH: JASON ROBBINS 06/19/2022 AT 2147 SRR    Culture STREPTOCOCCUS GALLOLYTICUS (A)  Final   Report Status 06/22/2022 FINAL  Final   Organism ID, Bacteria STREPTOCOCCUS GALLOLYTICUS  Final      Susceptibility   Streptococcus  gallolyticus - MIC*    PENICILLIN 0.12 SENSITIVE Sensitive     CEFTRIAXONE 0.25 SENSITIVE Sensitive     ERYTHROMYCIN >=8 RESISTANT Resistant     LEVOFLOXACIN 2 SENSITIVE Sensitive     VANCOMYCIN 0.5 SENSITIVE Sensitive     * STREPTOCOCCUS GALLOLYTICUS  Blood Culture ID Panel (Reflexed)     Status: Abnormal   Collection Time: 06/19/22 10:39 AM  Result Value Ref Range Status   Enterococcus faecalis NOT DETECTED NOT DETECTED Final   Enterococcus Faecium NOT DETECTED NOT DETECTED Final   Listeria monocytogenes NOT DETECTED NOT DETECTED Final   Staphylococcus species NOT DETECTED NOT DETECTED Final   Staphylococcus aureus (BCID) NOT DETECTED NOT DETECTED Final   Staphylococcus epidermidis NOT DETECTED NOT DETECTED Final   Staphylococcus lugdunensis NOT DETECTED NOT DETECTED Final   Streptococcus species DETECTED (A) NOT DETECTED Final    Comment: Not Enterococcus species, Streptococcus agalactiae, Streptococcus pyogenes, or Streptococcus pneumoniae. CRITICAL RESULT CALLED TO, READ BACK BY AND VERIFIED WITH: JASON ROBBINS 06/19/2022 AT 2147 SRR    Streptococcus agalactiae NOT DETECTED NOT DETECTED Final   Streptococcus pneumoniae NOT DETECTED NOT DETECTED Final   Streptococcus pyogenes NOT DETECTED NOT DETECTED Final   A.calcoaceticus-baumannii NOT DETECTED NOT DETECTED Final   Bacteroides fragilis NOT DETECTED NOT DETECTED Final   Enterobacterales NOT DETECTED NOT DETECTED Final   Enterobacter cloacae complex NOT DETECTED NOT DETECTED Final   Escherichia coli NOT DETECTED NOT DETECTED Final   Klebsiella aerogenes NOT DETECTED NOT DETECTED Final   Klebsiella oxytoca NOT DETECTED NOT DETECTED Final   Klebsiella pneumoniae NOT DETECTED NOT DETECTED Final   Proteus species NOT DETECTED NOT DETECTED Final   Salmonella species NOT DETECTED NOT DETECTED Final   Serratia marcescens NOT DETECTED NOT DETECTED Final   Haemophilus influenzae NOT DETECTED NOT DETECTED Final   Neisseria meningitidis  NOT DETECTED NOT DETECTED Final   Pseudomonas aeruginosa NOT DETECTED NOT DETECTED Final   Stenotrophomonas maltophilia NOT DETECTED NOT DETECTED Final   Candida albicans NOT DETECTED NOT DETECTED Final   Candida auris NOT DETECTED NOT DETECTED Final   Candida glabrata NOT DETECTED NOT DETECTED Final   Candida krusei NOT DETECTED NOT DETECTED Final   Candida parapsilosis NOT DETECTED NOT DETECTED Final   Candida tropicalis NOT DETECTED NOT DETECTED Final   Cryptococcus neoformans/gattii NOT DETECTED NOT DETECTED Final    Comment: Performed at [facility], [address] [place],  [address]  Blood Culture (routine x 2)     Status: Abnormal   Collection Time: 06/19/22 10:49 AM   Specimen: BLOOD  Result Value Ref Range Status   Specimen Description   Final    BLOOD RIGHT [facility] Performed  at [facility], [address] [place], [place] [address]    Special Requests   Final    BOTTLES DRAWN AEROBIC AND ANAEROBIC Blood Culture adequate volume Performed at [facility], [address] [place], [place] [address]    Culture  Setup Time   Final    IN BOTH AEROBIC AND ANAEROBIC BOTTLES GRAM POSITIVE COCCI CRITICAL RESULT CALLED TO, READ BACK BY AND VERIFIED WITH: JASON ROBBINS 06/19/2022 AT 2147 SRR    Culture (A)  Final    STREPTOCOCCUS GALLOLYTICUS SUSCEPTIBILITIES PERFORMED ON PREVIOUS CULTURE WITHIN THE LAST 5 DAYS. Performed at [facility], [address] [place], [place] [address]    Report Status 06/22/2022 FINAL  Final  Culture, blood (Routine X 2) w Reflex to ID Panel     Status: None   Collection Time: 06/19/22  7:40 PM   Specimen: BLOOD  Result Value Ref Range Status   Specimen Description BLOOD BLOOD RIGHT HAND  Final   Special Requests   Final    BOTTLES DRAWN AEROBIC ONLY Blood Culture results may not be optimal due to an inadequate volume of blood received in culture bottles   Culture   Final    NO GROWTH 5 DAYS Performed at  [facility], [address], [place], [place] [address]    Report Status 06/24/2022 FINAL  Final  Culture, blood (Routine X 2) w Reflex to ID Panel     Status: None   Collection Time: 06/19/22  7:40 PM   Specimen: BLOOD  Result Value Ref Range Status   Specimen Description BLOOD THUMB RIGHT  Final   Special Requests IN PEDIATRIC BOTTLE Blood Culture adequate volume  Final   Culture   Final    NO GROWTH 5 DAYS Performed at [facility], [address], [place], [place] [address]    Report Status 06/24/2022 FINAL  Final  Cath Tip Culture     Status: Abnormal   Collection Time: 06/20/22 11:24 AM   Specimen: Catheter Tip; Other  Result Value Ref Range Status   Specimen Description   Final    CATH TIP Performed at [facility], [address] [place], [place] [address]    Special Requests   Final    NONE Performed at [facility], [address] [place], [place] [address]    Culture STREPTOCOCCUS GALLOLYTICUS (A)  Final   Report Status 06/24/2022 FINAL  Final   Organism ID, Bacteria STREPTOCOCCUS GALLOLYTICUS  Final      Susceptibility   Streptococcus gallolyticus - MIC*    PENICILLIN 0.12 SENSITIVE Sensitive     CEFTRIAXONE <=0.12 SENSITIVE Sensitive     ERYTHROMYCIN >=8 RESISTANT Resistant     LEVOFLOXACIN 2 SENSITIVE Sensitive     VANCOMYCIN 0.5 SENSITIVE Sensitive     * STREPTOCOCCUS GALLOLYTICUS  Culture, blood (Routine X 2) w Reflex to ID Panel     Status: None (Preliminary result)   Collection Time: 06/23/22  1:34 PM   Specimen: BLOOD  Result Value Ref Range Status   Specimen Description BLOOD BLOOD RIGHT FOREARM  Final   Special Requests   Final    BOTTLES DRAWN AEROBIC AND ANAEROBIC Blood Culture results may not be optimal due to an excessive volume of blood received in culture bottles   Culture   Final    NO GROWTH 3 DAYS Performed at [facility], [address], [place], [place] [address]    Report Status  PENDING  Incomplete  Culture, blood (Routine X 2) w Reflex to ID Panel     Status: None (Preliminary result)   Collection Time: 06/23/22  1:39 PM   Specimen: BLOOD  Result Value Ref Range Status   Specimen Description BLOOD BLOOD RIGHT HAND  Final   Special Requests   Final    BOTTLES DRAWN AEROBIC AND ANAEROBIC Blood Culture adequate volume   Culture   Final    NO GROWTH 3 DAYS Performed at [facility], [address], [place], [place] [address]    Report Status PENDING  Incomplete    Coagulation Studies: No results for input(s): "LABPROT", "INR" in the last 72 hours.   Urinalysis: No results for input(s): "COLORURINE", "LABSPEC", "PHURINE", "GLUCOSEU", "HGBUR", "BILIRUBINUR", "KETONESUR", "PROTEINUR", "UROBILINOGEN", "NITRITE", "LEUKOCYTESUR" in the last 72 hours.  Invalid input(s): "APPERANCEUR"    Imaging: No results found.   Medications:    anticoagulant sodium citrate      ceFAZolin (ANCEF) IV 2 g (06/26/22 1217)    amLODipine  10 mg Oral q morning   calcium acetate  1,334 mg Oral TID WC   carbamazepine  1,000 mg Oral q morning   Chlorhexidine Gluconate Cloth  6 each Topical Q0600   doxazosin  4 mg Oral QHS   fluPHENAZine  2.5 mg Oral Daily   guaiFENesin-dextromethorphan  10 mL Oral TID   heparin  5,000 Units Subcutaneous Q8H   hydrALAZINE  100 mg Oral TID   isosorbide mononitrate  60 mg Oral BID   levothyroxine  50 mcg Oral Q0600   minoxidil  2.5 mg Oral Daily   pantoprazole  40 mg Oral QAC breakfast   sodium chloride flush  10-40 mL Intracatheter Q12H   sodium chloride flush  3 mL Intravenous Q12H   trihexyphenidyl  2 mg Oral BID   acetaminophen **OR** acetaminophen, alteplase, anticoagulant sodium citrate, heparin, hydrALAZINE, ipratropium-albuterol, lidocaine (PF), lidocaine-prilocaine, pentafluoroprop-tetrafluoroeth, polyethylene glycol  Assessment/ Plan:  [name] is a 62 y.o.  male ith past medical conditions including COPD,  hypertension, DVT, schizophrenia, and end-stage renal disease on hemodialysis.  Patient presents to the emergency department with somnolence. Patient has been admitted for ESRD on dialysis ([place]) [N18.6, Z99.2] Acute encephalopathy [G93.40] Altered mental status, unspecified altered mental status type [R41.82] Sepsis with encephalopathy without septic shock, due to unspecified organism ([place]) [A41.9, R65.20, G93.41] Bacteremia [R78.81]  [facility] [place]/MWF/left aVF  End stage renal disease on hemodialysis. Patient received HD yesterday. Streptococcus was detected and vascular removed CVC on 06/21/22.   Receiving dialysis today, UF goal 1 L as tolerated.  Next treatment scheduled for Monday.  Outpatient dialysis clinic notified of antibiotic needs at discharge.  2. Anemia of chronic kidney disease Lab Results  Component Value Date   HGB 8.2 (L) 06/26/2022  Patient receives Mircera in outpatient clinic. Will order EPO 10000 unit with dialysis  3. Secondary Hyperparathyroidism: with outpatient labs: PTH 459, phosphorus 3.4, calcium 9.7 on 06/08/22.   Lab Results  Component Value Date   PTH 194 (H) 05/11/2017   CALCIUM 8.9 06/26/2022   CAION 1.19 03/27/2022   PHOS 5.0 (H) 06/22/2022   Continue calcium acetate with meals.   4.  Hypertension with chronic kidney disease.  Home regimen includes amlodipine, clonidine, hydralazine, irbesartan, isosorbide, doxazosin and minoxidil.  Clonidine currently held.  Blood pressure 136/71 during dialysis  5. 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 Prescribed cefazoline. Will require treatment beyond discharge. ID recommending cefazolin  2g/2g/2g with dialysis. Plans to pursue TEE outpatient. Will continue above antibiotics with dialysis.    LOS: [place] 1/26/202412:33 PM

## 2022-06-26 NOTE — Plan of Care (Signed)
  Problem: Activity: Goal: Risk for activity intolerance will decrease Outcome: Not Progressing   Problem: Education: Goal: Knowledge of risk factors and measures for prevention of condition will improve Outcome: Progressing   Problem: Coping: Goal: Psychosocial and spiritual needs will be supported Outcome: Progressing   Problem: Respiratory: Goal: Will maintain a patent airway Outcome: Progressing   Problem: Nutrition: Goal: Adequate nutrition will be maintained Outcome: Progressing   Problem: Pain Managment: Goal: General experience of comfort will improve Outcome: Progressing   Problem: Skin Integrity: Goal: Risk for impaired skin integrity will decrease Outcome: Progressing

## 2022-06-26 NOTE — TOC Progression Note (Addendum)
Transition of Care [facility]) - Progression Note    Patient Details  Name: Todd Brown MRN: [id number] Date of Birth: August 12, 1960  Transition of Care [facility]) CM/SW [place], LCSW Phone Number: 06/26/2022, 9:17 AM  Clinical Narrative:     Called patient's guardian, Ivan Anchors, to complete assessment.  Lvm for a return call.  3:18  CSW returned Candice's phone call.  Lvm for a return call.       Expected Discharge Plan and Services                                               Social Determinants of Health (SDOH) Interventions SDOH Screenings   Food Insecurity: No Food Insecurity (10/08/2018)  Transportation Needs: No Transportation Needs (10/08/2018)  Alcohol Screen: Low Risk  (10/05/2019)  Financial Resource Strain: Low Risk  (10/08/2018)  Physical Activity: Unknown (10/08/2018)  Social Connections: Unknown (10/08/2018)  Stress: No Stress Concern Present (10/08/2018)  Tobacco Use: Low Risk  (06/19/2022)    Readmission Risk Interventions     No data to display

## 2022-06-27 LAB — COMPREHENSIVE METABOLIC PANEL
ALT: 6 U/L (ref 0–44)
AST: 26 U/L (ref 15–41)
Albumin: 3 g/dL — ABNORMAL LOW (ref 3.5–5.0)
Alkaline Phosphatase: 89 U/L (ref 38–126)
Anion gap: 11 (ref 5–15)
BUN: 34 mg/dL — ABNORMAL HIGH (ref 8–23)
CO2: 28 mmol/L (ref 22–32)
Calcium: 9 mg/dL (ref 8.9–10.3)
Chloride: 95 mmol/L — ABNORMAL LOW (ref 98–111)
Creatinine, Ser: 4.7 mg/dL — ABNORMAL HIGH (ref 0.61–1.24)
GFR, Estimated: 13 mL/min — ABNORMAL LOW (ref >60)
Glucose, Bld: 82 mg/dL (ref 70–99)
Potassium: 4.8 mmol/L (ref 3.5–5.1)
Sodium: 134 mmol/L — ABNORMAL LOW (ref 135–145)
Total Bilirubin: 0.6 mg/dL (ref 0.3–1.2)
Total Protein: 6.3 g/dL — ABNORMAL LOW (ref 6.5–8.1)

## 2022-06-27 LAB — CBC
HCT: 28.5 % — ABNORMAL LOW (ref 39.0–52.0)
Hemoglobin: 9.5 g/dL — ABNORMAL LOW (ref 13.0–17.0)
MCH: 33.8 pg (ref 26.0–34.0)
MCHC: 33.3 g/dL (ref 30.0–36.0)
MCV: 101.4 fL — ABNORMAL HIGH (ref 80.0–100.0)
Platelets: 189 10*3/uL (ref 150–400)
RBC: 2.81 MIL/uL — ABNORMAL LOW (ref 4.22–5.81)
RDW: 16.1 % — ABNORMAL HIGH (ref 11.5–15.5)
WBC: 6.4 10*3/uL (ref 4.0–10.5)
nRBC: 0 % (ref 0.0–0.2)

## 2022-06-27 LAB — C-REACTIVE PROTEIN: CRP: 2.4 mg/dL — ABNORMAL HIGH (ref <1.0)

## 2022-06-27 LAB — MAGNESIUM: Magnesium: 1.9 mg/dL (ref 1.7–2.4)

## 2022-06-27 MED ORDER — EPOETIN ALFA 4000 UNIT/ML IJ SOLN
4000.0000 [IU] | INTRAMUSCULAR | Status: DC
  Filled 2022-06-27: qty 1

## 2022-06-27 NOTE — Progress Notes (Signed)
  Progress Note   Patient: Todd Brown ZOX:[id number] DOB: November 04, 1960 DOA: 06/19/2022     8 DOS: the patient was seen and examined on 06/27/2022   Brief hospital course:  Assessment and Plan:  * SIRS (systemic inflammatory response syndrome) (HCC)/sepsis due to dialysis catheter  - IV cefazolin 2g MWF with dialysis for strep gallolyticus bacteremia (END DATE Jul 20 2022) - Spoke with guardian for consent for TEE and colonoscopy on 06/24/2022 but decision has not been made yet. Furthermore, the pt himself is not agreeable for these procedures     NSTEMI (non-ST elevated myocardial infarction) ([place]) - Monitor    Acute encephalopathy - Pt appears at his baseline  - Tegretol 1000 mg PO daily  - Synthroid 50 mcg PO daily    ESRD on dialysis ([place]) - Dialysis per nephrology  - Phoslo 1334 mg PO tid  - Artane 2 mg PO bid    Essential hypertension - Norvasc 10 mg PO daily  - Hydralazine 100 mg PO tid  - Imdur 60 mg PO bid  - Doxazosin 4 mg PO daily  - Minoxidil 2.5 mg PO daily    COPD (chronic obstructive pulmonary disease) (HCC) - Duoneb q6 hr PRN  - Robitussin DM tid for 3 days   Acute pulmonary edema - Dialysis as above    DVT prophylaxis: Heparin 5000 units sq q8hr  GI prophylaxis: Protonix 40 mg PO daily       Subjective: Pt seen and examined at the bedside. He continues on IV antibx's. Awaiting facility from case management for this pt's disposition (needs a facility to complete his IV antibx's).  Physical Exam: Vitals:   06/26/22 2347 06/26/22 2349 06/27/22 0500 06/27/22 0911  BP: (!) 187/88 (!) 171/77  (!) 194/86  Pulse: 63 63  68  Resp: 18 18  16   Temp: 98.5 F (36.9 C) (!) 97.2 F (36.2 C)  98.7 F (37.1 C)  TempSrc:    Oral  SpO2: 98%   100%  Weight:   78.9 kg   Height:       HENT:     Head: Normocephalic and atraumatic.  Cardiovascular:     Rate and Rhythm: Normal rate and regular rhythm.  Pulmonary:     Effort: Pulmonary effort is normal.   Abdominal:     Palpations: Abdomen is soft.  Skin:    General: Skin is warm.  Neurological:     Mental Status: He is alert.     Comments: Awake and alert   Psychiatric:        Mood and Affect: Mood normal.   Data Reviewed:   Disposition: Status is: Inpatient  Planned Discharge Destination: Rehab    Time spent: 35 minutes  Author: Lucienne Minks , MD 06/27/2022 9:18 AM  For on call review [email or web address].

## 2022-06-27 NOTE — Progress Notes (Signed)
[facility]  ROUNDING NOTE   Subjective:   Todd Brown is a 62 year old male with past medical conditions including COPD, hypertension, DVT, schizophrenia, and end-stage renal disease on hemodialysis.  Patient presents to the emergency department with somnolence. Patient has been admitted for ESRD on dialysis (Todd Brown) [N18.6, Z99.2] Acute encephalopathy [G93.40] Altered mental status, unspecified altered mental status type [R41.82] Sepsis with encephalopathy without septic shock, due to unspecified organism (Todd Brown) [A41.9, R65.20, G93.41] Bacteremia [R78.81]  Patient is known to our practice and receives outpatient dialysis treatments at [facility] on a MWF schedule, supervised by Dr. Holley Raring.    Patient sitting up in bed, requesting assistance with breakfast Alert and oriented Denies pain or discomfort Trace lower extremity edema present    Objective:  Vital signs in last 24 hours:  Temp:  [97.2 F (36.2 C)-98.7 F (37.1 C)] 98.7 F (37.1 C) (01/27 0911) Pulse Rate:  [63-77] 68 (01/27 0911) Resp:  [13-26] 16 (01/27 0911) BP: (129-194)/(66-88) 194/86 (01/27 0911) SpO2:  [97 %-100 %] 100 % (01/27 0911) Weight:  [77.7 kg-78.9 kg] 78.9 kg (01/27 0500)  Weight change: -0.7 kg Filed Weights   06/26/22 0901 06/26/22 1314 06/27/22 0500  Weight: 76.6 kg 77.7 kg 78.9 kg    Intake/Output: I/O last 3 completed shifts: In: 218 [P.O.:118; IV Piggyback:100] Out: 1000 [Other:1000]   Intake/Output this shift:  No intake/output data recorded.  Physical Exam: General: NAD  Head: Normocephalic, atraumatic. Moist oral mucosal membranes  Eyes: Anicteric  Lungs:  Clear to auscultation, normal effort, room air  Heart: Regular rate and rhythm  Abdomen:  Soft, tender, mild distension  Extremities:  Trace peripheral edema.  Neurologic: Nonfocal, moving all four extremities  Skin: No lesions  Access: Lt AVF    Basic Metabolic Panel: Recent Labs  Lab 06/22/22 0512  06/23/22 0540 06/24/22 0216 06/25/22 0155 06/26/22 0529 06/27/22 0406  NA 135 134* 136 134* 134* 134*  K 5.3* 4.8 4.6 4.4 5.3* 4.8  CL 97* 96* 97* 96* 96* 95*  CO2 25 27 27 29 29 28   GLUCOSE 91 81 86 91 78 82  BUN 60* 56* 40* 34* 48* 34*  CREATININE 7.11* 6.81* 5.10* 4.54* 6.15* 4.70*  CALCIUM 8.7* 8.5* 8.7* 8.6* 8.9 9.0  MG 2.0 1.9 1.9 1.7 1.8 1.9  PHOS 5.0*  --   --   --   --   --      Liver Function Tests: Recent Labs  Lab 06/20/22 1120 06/26/22 0529 06/27/22 0406  AST 47* 22 26  ALT 38 7 6  ALKPHOS 88 66 89  BILITOT 0.8 0.7 0.6  PROT 6.6 5.7* 6.3*  ALBUMIN 3.5 2.9* 3.0*    No results for input(s): "LIPASE", "AMYLASE" in the last 168 hours. No results for input(s): "AMMONIA" in the last 168 hours.  CBC: Recent Labs  Lab 06/20/22 1120 06/21/22 0649 06/23/22 0540 06/24/22 0216 06/25/22 0155 06/26/22 0529 06/27/22 0406  WBC 13.7*   < > 5.5 6.0 6.3 5.1 6.4  NEUTROABS 12.2*  --   --   --   --   --   --   HGB 8.9*   < > 8.5* 9.1* 8.1* 8.2* 9.5*  HCT 26.7*   < > 25.6* 27.2* 24.7* 25.0* 28.5*  MCV 102.3*   < > 102.0* 102.6* 102.5* 102.0* 101.4*  PLT 154   < > 136* 144* 139* 158 189   < > = values in this interval not displayed.  Cardiac Enzymes: No results for input(s): "CKTOTAL", "CKMB", "CKMBINDEX", "TROPONINI" in the last 168 hours.  BNP: Invalid input(s): "POCBNP"  CBG: Recent Labs  Lab 06/24/22 1155  GLUCAP 123*     Microbiology: Results for orders placed or performed during the hospital encounter of 06/19/22  Resp panel by RT-PCR (RSV, Flu A&B, Covid) Anterior Nasal Swab     Status: None   Collection Time: 06/19/22 10:31 AM   Specimen: Anterior Nasal Swab  Result Value Ref Range Status   SARS Coronavirus 2 by RT PCR NEGATIVE NEGATIVE Final    Comment: (NOTE) SARS-CoV-2 target nucleic acids are NOT DETECTED.  The SARS-CoV-2 RNA is generally detectable in upper respiratory specimens during the acute phase of infection. The  lowest concentration of SARS-CoV-2 viral copies this assay can detect is 138 copies/mL. A negative result does not preclude SARS-Cov-2 infection and should not be used as the sole basis for treatment or other patient management decisions. A negative result may occur with  improper specimen collection/handling, submission of specimen other than nasopharyngeal swab, presence of viral mutation(s) within the areas targeted by this assay, and inadequate number of viral copies(<138 copies/mL). A negative result must be combined with clinical observations, patient history, and epidemiological information. The expected result is Negative.  Fact Sheet for Patients:  EntrepreneurPulse.com.au  Fact Sheet for Healthcare Providers:  IncredibleEmployment.be  This test is no t yet approved or cleared by the [place] FDA and  has been authorized for detection and/or diagnosis of SARS-CoV-2 by FDA under an Emergency Use Authorization (EUA). This EUA will remain  in effect (meaning this test can be used) for the duration of the COVID-19 declaration under Section 564(b)(1) of the Act, 21 U.S.C.section 360bbb-3(b)(1), unless the authorization is terminated  or revoked sooner.       Influenza A by PCR NEGATIVE NEGATIVE Final   Influenza B by PCR NEGATIVE NEGATIVE Final    Comment: (NOTE) The Xpert Xpress SARS-CoV-2/FLU/RSV plus assay is intended as an aid in the diagnosis of influenza from Nasopharyngeal swab specimens and should not be used as a sole basis for treatment. Nasal washings and aspirates are unacceptable for Xpert Xpress SARS-CoV-2/FLU/RSV testing.  Fact Sheet for Patients: EntrepreneurPulse.com.au  Fact Sheet for Healthcare Providers: IncredibleEmployment.be  This test is not yet approved or cleared by the [place] FDA and has been authorized for detection and/or diagnosis of SARS-CoV-2 by FDA under  an Emergency Use Authorization (EUA). This EUA will remain in effect (meaning this test can be used) for the duration of the COVID-19 declaration under Section 564(b)(1) of the Act, 21 U.S.C. section 360bbb-3(b)(1), unless the authorization is terminated or revoked.     Resp Syncytial Virus by PCR NEGATIVE NEGATIVE Final    Comment: (NOTE) Fact Sheet for Patients: EntrepreneurPulse.com.au  Fact Sheet for Healthcare Providers: IncredibleEmployment.be  This test is not yet approved or cleared by the [place] FDA and has been authorized for detection and/or diagnosis of SARS-CoV-2 by FDA under an Emergency Use Authorization (EUA). This EUA will remain in effect (meaning this test can be used) for the duration of the COVID-19 declaration under Section 564(b)(1) of the Act, 21 U.S.C. section 360bbb-3(b)(1), unless the authorization is terminated or revoked.  Performed at [facility], [address] [place], [place] [address]   Blood Culture (routine x 2)     Status: Abnormal   Collection Time: 06/19/22 10:39 AM   Specimen: BLOOD  Result Value Ref Range Status   Specimen Description  Final    BLOOD RIGHT AC Performed at [facility], [address] [place], [place] [address]    Special Requests   Final    BOTTLES DRAWN AEROBIC AND ANAEROBIC Blood Culture adequate volume Performed at [facility], [address] [place], [place] [address]    Culture  Setup Time   Final    Organism ID to follow IN BOTH AEROBIC AND ANAEROBIC BOTTLES GRAM POSITIVE COCCI CRITICAL RESULT CALLED TO, READ BACK BY AND VERIFIED WITH: JASON ROBBINS 06/19/2022 AT 2147 SRR    Culture STREPTOCOCCUS GALLOLYTICUS (A)  Final   Report Status 06/22/2022 FINAL  Final   Organism ID, Bacteria STREPTOCOCCUS GALLOLYTICUS  Final      Susceptibility   Streptococcus gallolyticus - MIC*    PENICILLIN 0.12 SENSITIVE Sensitive     CEFTRIAXONE  0.25 SENSITIVE Sensitive     ERYTHROMYCIN >=8 RESISTANT Resistant     LEVOFLOXACIN 2 SENSITIVE Sensitive     VANCOMYCIN 0.5 SENSITIVE Sensitive     * STREPTOCOCCUS GALLOLYTICUS  Blood Culture ID Panel (Reflexed)     Status: Abnormal   Collection Time: 06/19/22 10:39 AM  Result Value Ref Range Status   Enterococcus faecalis NOT DETECTED NOT DETECTED Final   Enterococcus Faecium NOT DETECTED NOT DETECTED Final   Listeria monocytogenes NOT DETECTED NOT DETECTED Final   Staphylococcus species NOT DETECTED NOT DETECTED Final   Staphylococcus aureus (BCID) NOT DETECTED NOT DETECTED Final   Staphylococcus epidermidis NOT DETECTED NOT DETECTED Final   Staphylococcus lugdunensis NOT DETECTED NOT DETECTED Final   Streptococcus species DETECTED (A) NOT DETECTED Final    Comment: Not Enterococcus species, Streptococcus agalactiae, Streptococcus pyogenes, or Streptococcus pneumoniae. CRITICAL RESULT CALLED TO, READ BACK BY AND VERIFIED WITH: JASON ROBBINS 06/19/2022 AT 2147 SRR    Streptococcus agalactiae NOT DETECTED NOT DETECTED Final   Streptococcus pneumoniae NOT DETECTED NOT DETECTED Final   Streptococcus pyogenes NOT DETECTED NOT DETECTED Final   A.calcoaceticus-baumannii NOT DETECTED NOT DETECTED Final   Bacteroides fragilis NOT DETECTED NOT DETECTED Final   Enterobacterales NOT DETECTED NOT DETECTED Final   Enterobacter cloacae complex NOT DETECTED NOT DETECTED Final   Escherichia coli NOT DETECTED NOT DETECTED Final   Klebsiella aerogenes NOT DETECTED NOT DETECTED Final   Klebsiella oxytoca NOT DETECTED NOT DETECTED Final   Klebsiella pneumoniae NOT DETECTED NOT DETECTED Final   Proteus species NOT DETECTED NOT DETECTED Final   Salmonella species NOT DETECTED NOT DETECTED Final   Serratia marcescens NOT DETECTED NOT DETECTED Final   Haemophilus influenzae NOT DETECTED NOT DETECTED Final   Neisseria meningitidis NOT DETECTED NOT DETECTED Final   Pseudomonas aeruginosa NOT DETECTED NOT  DETECTED Final   Stenotrophomonas maltophilia NOT DETECTED NOT DETECTED Final   Candida albicans NOT DETECTED NOT DETECTED Final   Candida auris NOT DETECTED NOT DETECTED Final   Candida glabrata NOT DETECTED NOT DETECTED Final   Candida krusei NOT DETECTED NOT DETECTED Final   Candida parapsilosis NOT DETECTED NOT DETECTED Final   Candida tropicalis NOT DETECTED NOT DETECTED Final   Cryptococcus neoformans/gattii NOT DETECTED NOT DETECTED Final    Comment: Performed at [facility], [address] [place], [place] [address]  Blood Culture (routine x 2)     Status: Abnormal   Collection Time: 06/19/22 10:49 AM   Specimen: BLOOD  Result Value Ref Range Status   Specimen Description   Final    BLOOD RIGHT AC Performed at [facility], [address] [place],  [place] [address]    Special Requests   Final    BOTTLES DRAWN AEROBIC AND ANAEROBIC Blood Culture adequate volume Performed at [facility], [address] [place], [place] [address]    Culture  Setup Time   Final    IN BOTH AEROBIC AND ANAEROBIC BOTTLES GRAM POSITIVE COCCI CRITICAL RESULT CALLED TO, READ BACK BY AND VERIFIED WITH: JASON ROBBINS 06/19/2022 AT 2147 SRR    Culture (A)  Final    STREPTOCOCCUS GALLOLYTICUS SUSCEPTIBILITIES PERFORMED ON PREVIOUS CULTURE WITHIN THE LAST 5 DAYS. Performed at [facility], [address] [place], [place] [address]    Report Status 06/22/2022 FINAL  Final  Culture, blood (Routine X 2) w Reflex to ID Panel     Status: None   Collection Time: 06/19/22  7:40 PM   Specimen: BLOOD  Result Value Ref Range Status   Specimen Description BLOOD BLOOD RIGHT HAND  Final   Special Requests   Final    BOTTLES DRAWN AEROBIC ONLY Blood Culture results may not be optimal due to an inadequate volume of blood received in culture bottles   Culture   Final    NO GROWTH 5 DAYS Performed at [facility], [address], [place], [place] [address]     Report Status 06/24/2022 FINAL  Final  Culture, blood (Routine X 2) w Reflex to ID Panel     Status: None   Collection Time: 06/19/22  7:40 PM   Specimen: BLOOD  Result Value Ref Range Status   Specimen Description BLOOD THUMB RIGHT  Final   Special Requests IN PEDIATRIC BOTTLE Blood Culture adequate volume  Final   Culture   Final    NO GROWTH 5 DAYS Performed at [facility], [address], [place], [place] [address]    Report Status 06/24/2022 FINAL  Final  Cath Tip Culture     Status: Abnormal   Collection Time: 06/20/22 11:24 AM   Specimen: Catheter Tip; Other  Result Value Ref Range Status   Specimen Description   Final    CATH TIP Performed at [facility], [address] [place], [place] [address]    Special Requests   Final    NONE Performed at [facility], [address] [place], [place] [address]    Culture STREPTOCOCCUS GALLOLYTICUS (A)  Final   Report Status 06/24/2022 FINAL  Final   Organism ID, Bacteria STREPTOCOCCUS GALLOLYTICUS  Final      Susceptibility   Streptococcus gallolyticus - MIC*    PENICILLIN 0.12 SENSITIVE Sensitive     CEFTRIAXONE <=0.12 SENSITIVE Sensitive     ERYTHROMYCIN >=8 RESISTANT Resistant     LEVOFLOXACIN 2 SENSITIVE Sensitive     VANCOMYCIN 0.5 SENSITIVE Sensitive     * STREPTOCOCCUS GALLOLYTICUS  Culture, blood (Routine X 2) w Reflex to ID Panel     Status: None (Preliminary result)   Collection Time: 06/23/22  1:34 PM   Specimen: BLOOD  Result Value Ref Range Status   Specimen Description BLOOD BLOOD RIGHT FOREARM  Final   Special Requests   Final    BOTTLES DRAWN AEROBIC AND ANAEROBIC Blood Culture results may not be optimal due to an excessive volume of blood received in culture bottles   Culture   Final    NO GROWTH 4 DAYS Performed at [facility], [address], [place], [place] [address]    Report Status PENDING  Incomplete  Culture, blood (Routine X 2) w  Reflex to ID Panel      Status: None (Preliminary result)   Collection Time: 06/23/22  1:39 PM   Specimen: BLOOD  Result Value Ref Range Status   Specimen Description BLOOD BLOOD RIGHT HAND  Final   Special Requests   Final    BOTTLES DRAWN AEROBIC AND ANAEROBIC Blood Culture adequate volume   Culture   Final    NO GROWTH 4 DAYS Performed at [facility], [address] [place], [place] [address]    Report Status PENDING  Incomplete    Coagulation Studies: No results for input(s): "LABPROT", "INR" in the last 72 hours.   Urinalysis: No results for input(s): "COLORURINE", "LABSPEC", "PHURINE", "GLUCOSEU", "HGBUR", "BILIRUBINUR", "KETONESUR", "PROTEINUR", "UROBILINOGEN", "NITRITE", "LEUKOCYTESUR" in the last 72 hours.  Invalid input(s): "APPERANCEUR"    Imaging: No results found.   Medications:    anticoagulant sodium citrate      ceFAZolin (ANCEF) IV Stopped (06/26/22 1251)    amLODipine  10 mg Oral q morning   calcium acetate  1,334 mg Oral TID WC   carbamazepine  1,000 mg Oral q morning   Chlorhexidine Gluconate Cloth  6 each Topical Q0600   doxazosin  4 mg Oral QHS   epoetin (EPOGEN/PROCRIT) injection  10,000 Units Intravenous Q M,W,F-HD   fluPHENAZine  2.5 mg Oral Daily   heparin  5,000 Units Subcutaneous Q8H   hydrALAZINE  100 mg Oral TID   isosorbide mononitrate  60 mg Oral BID   levothyroxine  50 mcg Oral Q0600   minoxidil  2.5 mg Oral Daily   pantoprazole  40 mg Oral QAC breakfast   sodium chloride flush  10-40 mL Intracatheter Q12H   sodium chloride flush  3 mL Intravenous Q12H   trihexyphenidyl  2 mg Oral BID   acetaminophen **OR** acetaminophen, alteplase, anticoagulant sodium citrate, heparin, hydrALAZINE, ipratropium-albuterol, lidocaine (PF), lidocaine-prilocaine, pentafluoroprop-tetrafluoroeth, polyethylene glycol  Assessment/ Plan:  Todd Brown is a 62 y.o.  male ith past medical conditions including COPD, hypertension, DVT, schizophrenia, and end-stage  renal disease on hemodialysis.  Patient presents to the emergency department with somnolence. Patient has been admitted for ESRD on dialysis (Todd Brown) [N18.6, Z99.2] Acute encephalopathy [G93.40] Altered mental status, unspecified altered mental status type [R41.82] Sepsis with encephalopathy without septic shock, due to unspecified organism (Todd Brown) [A41.9, R65.20, G93.41] Bacteremia [R78.81]  [facility] [place] [facility]/MWF/left aVF  End stage renal disease on hemodialysis. Streptococcus was detected and vascular removed CVC on 06/21/22.   Patient tolerated dialysis well yesterday, UF 1 L achieved.  Next treatment scheduled for Monday.  Outpatient clinic has been notified of antibiotic recommendation at discharge.    2. Anemia of chronic kidney disease Lab Results  Component Value Date   HGB 9.5 (L) 06/27/2022  Patient receives Mircera in outpatient clinic. Hemoglobin within desired range.  Will reduce to low-dose EPO with dialysis treatments.  3. Secondary Hyperparathyroidism: with outpatient labs: PTH 459, phosphorus 3.4, calcium 9.7 on 06/08/22.   Lab Results  Component Value Date   PTH 194 (H) 05/11/2017   CALCIUM 9.0 06/27/2022   CAION 1.19 03/27/2022   PHOS 5.0 (H) 06/22/2022  Bone minerals within acceptable range.  Continue calcium acetate with meals  4.  Hypertension with chronic kidney disease.  Home regimen includes amlodipine, clonidine, hydralazine, irbesartan, isosorbide, doxazosin and minoxidil.  Clonidine currently held.  Blood pressure elevated this morning, 194/86.  5. 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 Prescribed cefazoline.  ID recommending cefazolin 2g/2g/2g  with dialysis at discharge. Plans to pursue TEE outpatient   LOS: 8   1/27/202411:06 AM

## 2022-06-27 NOTE — Plan of Care (Signed)

## 2022-06-28 LAB — COMPREHENSIVE METABOLIC PANEL
ALT: <5 U/L (ref 0–44)
AST: 24 U/L (ref 15–41)
Albumin: 3 g/dL — ABNORMAL LOW (ref 3.5–5.0)
Alkaline Phosphatase: 78 U/L (ref 38–126)
Anion gap: 5 (ref 5–15)
BUN: 45 mg/dL — ABNORMAL HIGH (ref 8–23)
CO2: 29 mmol/L (ref 22–32)
Calcium: 8.9 mg/dL (ref 8.9–10.3)
Chloride: 97 mmol/L — ABNORMAL LOW (ref 98–111)
Creatinine, Ser: 6.09 mg/dL — ABNORMAL HIGH (ref 0.61–1.24)
GFR, Estimated: 10 mL/min — ABNORMAL LOW (ref >60)
Glucose, Bld: 74 mg/dL (ref 70–99)
Potassium: 5.4 mmol/L — ABNORMAL HIGH (ref 3.5–5.1)
Sodium: 131 mmol/L — ABNORMAL LOW (ref 135–145)
Total Bilirubin: 0.7 mg/dL (ref 0.3–1.2)
Total Protein: 5.9 g/dL — ABNORMAL LOW (ref 6.5–8.1)

## 2022-06-28 LAB — CBC
HCT: 25.5 % — ABNORMAL LOW (ref 39.0–52.0)
Hemoglobin: 8.5 g/dL — ABNORMAL LOW (ref 13.0–17.0)
MCH: 33.9 pg (ref 26.0–34.0)
MCHC: 33.3 g/dL (ref 30.0–36.0)
MCV: 101.6 fL — ABNORMAL HIGH (ref 80.0–100.0)
Platelets: 190 10*3/uL (ref 150–400)
RBC: 2.51 MIL/uL — ABNORMAL LOW (ref 4.22–5.81)
RDW: 16.3 % — ABNORMAL HIGH (ref 11.5–15.5)
WBC: 5.4 10*3/uL (ref 4.0–10.5)
nRBC: 0 % (ref 0.0–0.2)

## 2022-06-28 LAB — MAGNESIUM: Magnesium: 2 mg/dL (ref 1.7–2.4)

## 2022-06-28 LAB — CULTURE, BLOOD (ROUTINE X 2)
Culture: NO GROWTH
Culture: NO GROWTH
Special Requests: ADEQUATE

## 2022-06-28 LAB — C-REACTIVE PROTEIN: CRP: 1.6 mg/dL — ABNORMAL HIGH (ref <1.0)

## 2022-06-28 MED ORDER — SODIUM ZIRCONIUM CYCLOSILICATE 10 G PO PACK
10.0000 g | PACK | Freq: Three times a day (TID) | ORAL | Status: AC
  Administered 2022-06-28 – 2022-06-29 (×5): 10 g via ORAL
  Filled 2022-06-28 (×6): qty 1

## 2022-06-28 NOTE — Plan of Care (Signed)
  Problem: Pain Managment: Goal: General experience of comfort will improve Outcome: Progressing   Problem: Safety: Goal: Ability to remain free from injury will improve Outcome: Progressing   Problem: Skin Integrity: Goal: Risk for impaired skin integrity will decrease Outcome: Progressing

## 2022-06-28 NOTE — Progress Notes (Signed)
  Progress Note   Patient: Todd Brown LXB:[id number] DOB: [birth year] DOA: 06/19/2022     9 DOS: the patient was seen and examined on 06/28/2022   Brief hospital course:  Assessment and Plan:  * SIRS (systemic inflammatory response syndrome) (HCC)/sepsis due to dialysis catheter  - IV cefazolin 2g MWF with dialysis for strep gallolyticus bacteremia (END DATE Jul 20 2022) - Spoke with guardian for consent for TEE and colonoscopy on 06/24/2022 but decision has not been made yet. Furthermore, the pt himself is not agreeable for these procedures     NSTEMI (non-ST elevated myocardial infarction) ([place]) - Monitor    Acute encephalopathy - Pt appears at his baseline  - Tegretol 1000 mg PO daily  - Synthroid 50 mcg PO daily    ESRD on dialysis ([place]) - Dialysis per nephrology  - Phoslo 1334 mg PO tid  - Artane 2 mg PO bid    Essential hypertension - Norvasc 10 mg PO daily  - Hydralazine 100 mg PO tid  - Imdur 60 mg PO bid  - Doxazosin 4 mg PO daily  - Minoxidil 2.5 mg PO daily    COPD (chronic obstructive pulmonary disease) (HCC) - Duoneb q6 hr PRN  - Robitussin DM tid for 3 days   Acute pulmonary edema - Dialysis as above   Hyperkalemia  - Lokelma 10 g PO tid    DVT prophylaxis: Heparin 5000 units sq q8hr  GI prophylaxis: Protonix 40 mg PO daily       Subjective: Pt seen and examined at the bedside. Lokelma ordered today for hyperkalemia. Continue with dialysis and concurrent IV antibx's. Case management working on disposition.  Physical Exam: Vitals:   06/27/22 2215 06/27/22 2350 06/28/22 0500 06/28/22 0750  BP: (!) 151/99 124/68  (!) 164/74  Pulse: 66 85  66  Resp:  18  18  Temp:  98.6 F (37 C)  98.4 F (36.9 C)  TempSrc:      SpO2:  94%  97%  Weight:   80 kg   Height:       HENT:     Head: Normocephalic and atraumatic.  Cardiovascular:     Rate and Rhythm: Normal rate and regular rhythm.  Pulmonary:     Effort: Pulmonary effort is normal.   Abdominal:     Palpations: Abdomen is soft.  Skin:    General: Skin is warm.  Neurological:     Mental Status: He is alert.     Comments: Awake and alert   Psychiatric:        Mood and Affect: Mood normal.   Data Reviewed:   Disposition: Status is: Inpatient  Planned Discharge Destination: Skilled nursing facility    Time spent: 35 minutes  Author: Lucienne Minks , MD 06/28/2022 10:07 AM  For on call review [email or web address].

## 2022-06-28 NOTE — Progress Notes (Signed)
[facility]  ROUNDING NOTE   Subjective:   Todd Brown is a 62 year old male with past medical conditions including COPD, hypertension, DVT, schizophrenia, and end-stage renal disease on hemodialysis.  Patient presents to the emergency department with somnolence. Patient has been admitted for ESRD on dialysis ([place]) [N18.6, Z99.2] Acute encephalopathy [G93.40] Altered mental status, unspecified altered mental status type [R41.82] Sepsis with encephalopathy without septic shock, due to unspecified organism ([place]) [A41.9, R65.20, G93.41] Bacteremia [R78.81]  Patient is known to our practice and receives outpatient dialysis treatments at [facility] on a MWF schedule, supervised by Dr. Holley Raring.    Patient seen sitting up in bed, awaiting breakfast tray Denies pain or discomfort    Objective:  Vital signs in last 24 hours:  Temp:  [98.4 F (36.9 C)-98.6 F (37 C)] 98.4 F (36.9 C) (01/28 0750) Pulse Rate:  [62-85] 66 (01/28 0750) Resp:  [18] 18 (01/28 0750) BP: (102-164)/(57-99) 164/74 (01/28 0750) SpO2:  [94 %-100 %] 97 % (01/28 0750) Weight:  [80 kg] 80 kg (01/28 0500)  Weight change: 3.4 kg Filed Weights   06/26/22 1314 06/27/22 0500 06/28/22 0500  Weight: 77.7 kg 78.9 kg 80 kg    Intake/Output: I/O last 3 completed shifts: In: 112 [P.O.:112] Out: -    Intake/Output this shift:  No intake/output data recorded.  Physical Exam: General: NAD  Head: Normocephalic, atraumatic. Moist oral mucosal membranes  Eyes: Anicteric  Lungs:  Clear to auscultation, normal effort, room air  Heart: Regular rate and rhythm  Abdomen:  Soft, tender, mild distension  Extremities:  1+ peripheral edema.  Neurologic: Nonfocal, moving all four extremities  Skin: No lesions  Access: Lt AVF    Basic Metabolic Panel: Recent Labs  Lab 06/22/22 0512 06/23/22 0540 06/24/22 0216 06/25/22 0155 06/26/22 0529 06/27/22 0406 06/28/22 0613  NA 135   < > 136 134* 134*  134* 131*  K 5.3*   < > 4.6 4.4 5.3* 4.8 5.4*  CL 97*   < > 97* 96* 96* 95* 97*  CO2 25   < > 27 29 29 28 29   GLUCOSE 91   < > 86 91 78 82 74  BUN 60*   < > 40* 34* 48* 34* 45*  CREATININE 7.11*   < > 5.10* 4.54* 6.15* 4.70* 6.09*  CALCIUM 8.7*   < > 8.7* 8.6* 8.9 9.0 8.9  MG 2.0   < > 1.9 1.7 1.8 1.9 2.0  PHOS 5.0*  --   --   --   --   --   --    < > = values in this interval not displayed.     Liver Function Tests: Recent Labs  Lab 06/26/22 0529 06/27/22 0406 06/28/22 0613  AST 22 26 24   ALT 7 6 <5  ALKPHOS 66 89 78  BILITOT 0.7 0.6 0.7  PROT 5.7* 6.3* 5.9*  ALBUMIN 2.9* 3.0* 3.0*    No results for input(s): "LIPASE", "AMYLASE" in the last 168 hours. No results for input(s): "AMMONIA" in the last 168 hours.  CBC: Recent Labs  Lab 06/24/22 0216 06/25/22 0155 06/26/22 0529 06/27/22 0406 06/28/22 0613  WBC 6.0 6.3 5.1 6.4 5.4  HGB 9.1* 8.1* 8.2* 9.5* 8.5*  HCT 27.2* 24.7* 25.0* 28.5* 25.5*  MCV 102.6* 102.5* 102.0* 101.4* 101.6*  PLT 144* 139* 158 189 190     Cardiac Enzymes: No results for input(s): "CKTOTAL", "CKMB", "CKMBINDEX", "TROPONINI" in the last 168 hours.  BNP: Invalid input(s): "  POCBNP"  CBG: Recent Labs  Lab 06/24/22 1155  GLUCAP 123*     Microbiology: Results for orders placed or performed during the hospital encounter of 06/19/22  Resp panel by RT-PCR (RSV, Flu A&B, Covid) Anterior Nasal Swab     Status: None   Collection Time: 06/19/22 10:31 AM   Specimen: Anterior Nasal Swab  Result Value Ref Range Status   SARS Coronavirus 2 by RT PCR NEGATIVE NEGATIVE Final    Comment: (NOTE) SARS-CoV-2 target nucleic acids are NOT DETECTED.  The SARS-CoV-2 RNA is generally detectable in upper respiratory specimens during the acute phase of infection. The lowest concentration of SARS-CoV-2 viral copies this assay can detect is 138 copies/mL. A negative result does not preclude SARS-Cov-2 infection and should not be used as the sole basis for  treatment or other patient management decisions. A negative result may occur with  improper specimen collection/handling, submission of specimen other than nasopharyngeal swab, presence of viral mutation(s) within the areas targeted by this assay, and inadequate number of viral copies(<138 copies/mL). A negative result must be combined with clinical observations, patient history, and epidemiological information. The expected result is Negative.  Fact Sheet for Patients:  EntrepreneurPulse.com.au  Fact Sheet for Healthcare Providers:  IncredibleEmployment.be  This test is no t yet approved or cleared by the [place] FDA and  has been authorized for detection and/or diagnosis of SARS-CoV-2 by FDA under an Emergency Use Authorization (EUA). This EUA will remain  in effect (meaning this test can be used) for the duration of the COVID-19 declaration under Section 564(b)(1) of the Act, 21 U.S.C.section 360bbb-3(b)(1), unless the authorization is terminated  or revoked sooner.       Influenza A by PCR NEGATIVE NEGATIVE Final   Influenza B by PCR NEGATIVE NEGATIVE Final    Comment: (NOTE) The Xpert Xpress SARS-CoV-2/FLU/RSV plus assay is intended as an aid in the diagnosis of influenza from Nasopharyngeal swab specimens and should not be used as a sole basis for treatment. Nasal washings and aspirates are unacceptable for Xpert Xpress SARS-CoV-2/FLU/RSV testing.  Fact Sheet for Patients: EntrepreneurPulse.com.au  Fact Sheet for Healthcare Providers: IncredibleEmployment.be  This test is not yet approved or cleared by the [place] FDA and has been authorized for detection and/or diagnosis of SARS-CoV-2 by FDA under an Emergency Use Authorization (EUA). This EUA will remain in effect (meaning this test can be used) for the duration of the COVID-19 declaration under Section 564(b)(1) of the Act, 21  U.S.C. section 360bbb-3(b)(1), unless the authorization is terminated or revoked.     Resp Syncytial Virus by PCR NEGATIVE NEGATIVE Final    Comment: (NOTE) Fact Sheet for Patients: EntrepreneurPulse.com.au  Fact Sheet for Healthcare Providers: IncredibleEmployment.be  This test is not yet approved or cleared by the [place] FDA and has been authorized for detection and/or diagnosis of SARS-CoV-2 by FDA under an Emergency Use Authorization (EUA). This EUA will remain in effect (meaning this test can be used) for the duration of the COVID-19 declaration under Section 564(b)(1) of the Act, 21 U.S.C. section 360bbb-3(b)(1), unless the authorization is terminated or revoked.  Performed at [facility], [address] [place], [place] [address]   Blood Culture (routine x 2)     Status: Abnormal   Collection Time: 06/19/22 10:39 AM   Specimen: BLOOD  Result Value Ref Range Status   Specimen Description   Final    BLOOD RIGHT [facility] Performed at [facility], [place],  [place] [address]    Special Requests   Final    BOTTLES DRAWN AEROBIC AND ANAEROBIC Blood Culture adequate volume Performed at [facility], [address] [place], [place] [address]    Culture  Setup Time   Final    Organism ID to follow IN BOTH AEROBIC AND ANAEROBIC BOTTLES GRAM POSITIVE COCCI CRITICAL RESULT CALLED TO, READ BACK BY AND VERIFIED WITH: JASON ROBBINS 06/19/2022 AT 2147 SRR    Culture STREPTOCOCCUS GALLOLYTICUS (A)  Final   Report Status 06/22/2022 FINAL  Final   Organism ID, Bacteria STREPTOCOCCUS GALLOLYTICUS  Final      Susceptibility   Streptococcus gallolyticus - MIC*    PENICILLIN 0.12 SENSITIVE Sensitive     CEFTRIAXONE 0.25 SENSITIVE Sensitive     ERYTHROMYCIN >=8 RESISTANT Resistant     LEVOFLOXACIN 2 SENSITIVE Sensitive     VANCOMYCIN 0.5 SENSITIVE Sensitive     * STREPTOCOCCUS GALLOLYTICUS  Blood  Culture ID Panel (Reflexed)     Status: Abnormal   Collection Time: 06/19/22 10:39 AM  Result Value Ref Range Status   Enterococcus faecalis NOT DETECTED NOT DETECTED Final   Enterococcus Faecium NOT DETECTED NOT DETECTED Final   Listeria monocytogenes NOT DETECTED NOT DETECTED Final   Staphylococcus species NOT DETECTED NOT DETECTED Final   Staphylococcus aureus (BCID) NOT DETECTED NOT DETECTED Final   Staphylococcus epidermidis NOT DETECTED NOT DETECTED Final   Staphylococcus lugdunensis NOT DETECTED NOT DETECTED Final   Streptococcus species DETECTED (A) NOT DETECTED Final    Comment: Not Enterococcus species, Streptococcus agalactiae, Streptococcus pyogenes, or Streptococcus pneumoniae. CRITICAL RESULT CALLED TO, READ BACK BY AND VERIFIED WITH: JASON ROBBINS 06/19/2022 AT 2147 SRR    Streptococcus agalactiae NOT DETECTED NOT DETECTED Final   Streptococcus pneumoniae NOT DETECTED NOT DETECTED Final   Streptococcus pyogenes NOT DETECTED NOT DETECTED Final   A.calcoaceticus-baumannii NOT DETECTED NOT DETECTED Final   Bacteroides fragilis NOT DETECTED NOT DETECTED Final   Enterobacterales NOT DETECTED NOT DETECTED Final   Enterobacter cloacae complex NOT DETECTED NOT DETECTED Final   Escherichia coli NOT DETECTED NOT DETECTED Final   Klebsiella aerogenes NOT DETECTED NOT DETECTED Final   Klebsiella oxytoca NOT DETECTED NOT DETECTED Final   Klebsiella pneumoniae NOT DETECTED NOT DETECTED Final   Proteus species NOT DETECTED NOT DETECTED Final   Salmonella species NOT DETECTED NOT DETECTED Final   Serratia marcescens NOT DETECTED NOT DETECTED Final   Haemophilus influenzae NOT DETECTED NOT DETECTED Final   Neisseria meningitidis NOT DETECTED NOT DETECTED Final   Pseudomonas aeruginosa NOT DETECTED NOT DETECTED Final   Stenotrophomonas maltophilia NOT DETECTED NOT DETECTED Final   Candida albicans NOT DETECTED NOT DETECTED Final   Candida auris NOT DETECTED NOT DETECTED Final   Candida  glabrata NOT DETECTED NOT DETECTED Final   Candida krusei NOT DETECTED NOT DETECTED Final   Candida parapsilosis NOT DETECTED NOT DETECTED Final   Candida tropicalis NOT DETECTED NOT DETECTED Final   Cryptococcus neoformans/gattii NOT DETECTED NOT DETECTED Final    Comment: Performed at [facility], [address] [place], [place] [address]  Blood Culture (routine x 2)     Status: Abnormal   Collection Time: 06/19/22 10:49 AM   Specimen: BLOOD  Result Value Ref Range Status   Specimen Description   Final    BLOOD RIGHT [facility] Performed at [facility], [address] [place], [place] [address]    Special Requests   Final    BOTTLES DRAWN AEROBIC AND  ANAEROBIC Blood Culture adequate volume Performed at [facility], [address] [place], [place] [address]    Culture  Setup Time   Final    IN BOTH AEROBIC AND ANAEROBIC BOTTLES GRAM POSITIVE COCCI CRITICAL RESULT CALLED TO, READ BACK BY AND VERIFIED WITH: JASON ROBBINS 06/19/2022 AT 2147 SRR    Culture (A)  Final    STREPTOCOCCUS GALLOLYTICUS SUSCEPTIBILITIES PERFORMED ON PREVIOUS CULTURE WITHIN THE LAST 5 DAYS. Performed at [facility], [place] [address], [place], [place] [address]    Report Status 06/22/2022 FINAL  Final  Culture, blood (Routine X 2) w Reflex to ID Panel     Status: None   Collection Time: 06/19/22  7:40 PM   Specimen: BLOOD  Result Value Ref Range Status   Specimen Description BLOOD BLOOD RIGHT HAND  Final   Special Requests   Final    BOTTLES DRAWN AEROBIC ONLY Blood Culture results may not be optimal due to an inadequate volume of blood received in culture bottles   Culture   Final    NO GROWTH 5 DAYS Performed at [facility], [address] [place], [place] [address]    Report Status 06/24/2022 FINAL  Final  Culture, blood (Routine X 2) w Reflex to ID Panel     Status: None   Collection Time: 06/19/22  7:40 PM   Specimen: BLOOD  Result Value Ref Range  Status   Specimen Description BLOOD THUMB RIGHT  Final   Special Requests IN PEDIATRIC BOTTLE Blood Culture adequate volume  Final   Culture   Final    NO GROWTH 5 DAYS Performed at [facility], [address] [place], [place] [address]    Report Status 06/24/2022 FINAL  Final  Cath Tip Culture     Status: Abnormal   Collection Time: 06/20/22 11:24 AM   Specimen: Catheter Tip; Other  Result Value Ref Range Status   Specimen Description   Final    CATH TIP Performed at [facility], [address] [place], [place] [address]    Special Requests   Final    NONE Performed at [facility], [address] [place], [place] [address]    Culture STREPTOCOCCUS GALLOLYTICUS (A)  Final   Report Status 06/24/2022 FINAL  Final   Organism ID, Bacteria STREPTOCOCCUS GALLOLYTICUS  Final      Susceptibility   Streptococcus gallolyticus - MIC*    PENICILLIN 0.12 SENSITIVE Sensitive     CEFTRIAXONE <=0.12 SENSITIVE Sensitive     ERYTHROMYCIN >=8 RESISTANT Resistant     LEVOFLOXACIN 2 SENSITIVE Sensitive     VANCOMYCIN 0.5 SENSITIVE Sensitive     * STREPTOCOCCUS GALLOLYTICUS  Culture, blood (Routine X 2) w Reflex to ID Panel     Status: None   Collection Time: 06/23/22  1:34 PM   Specimen: BLOOD  Result Value Ref Range Status   Specimen Description BLOOD BLOOD RIGHT FOREARM  Final   Special Requests   Final    BOTTLES DRAWN AEROBIC AND ANAEROBIC Blood Culture results may not be optimal due to an excessive volume of blood received in culture bottles   Culture   Final    NO GROWTH 5 DAYS Performed at [facility], [address], [place], [place] [address]    Report Status 06/28/2022 FINAL  Final  Culture, blood (Routine X 2) w Reflex to ID Panel     Status: None   Collection Time: 06/23/22  1:39 PM  Specimen: BLOOD  Result Value Ref Range Status   Specimen Description BLOOD BLOOD RIGHT HAND  Final   Special Requests   Final    BOTTLES DRAWN  AEROBIC AND ANAEROBIC Blood Culture adequate volume   Culture   Final    NO GROWTH 5 DAYS Performed at [facility], [address] [place],  [address]    Report Status 06/28/2022 FINAL  Final    Coagulation Studies: No results for input(s): "LABPROT", "INR" in the last 72 hours.   Urinalysis: No results for input(s): "COLORURINE", "LABSPEC", "PHURINE", "GLUCOSEU", "HGBUR", "BILIRUBINUR", "KETONESUR", "PROTEINUR", "UROBILINOGEN", "NITRITE", "LEUKOCYTESUR" in the last 72 hours.  Invalid input(s): "APPERANCEUR"    Imaging: No results found.   Medications:    anticoagulant sodium citrate      ceFAZolin (ANCEF) IV Stopped (06/26/22 1251)    amLODipine  10 mg Oral q morning   calcium acetate  1,334 mg Oral TID WC   carbamazepine  1,000 mg Oral q morning   Chlorhexidine Gluconate Cloth  6 each Topical Q0600   doxazosin  4 mg Oral QHS   [START ON 06/29/2022] epoetin (EPOGEN/PROCRIT) injection  4,000 Units Intravenous Q M,W,F-HD   fluPHENAZine  2.5 mg Oral Daily   heparin  5,000 Units Subcutaneous Q8H   hydrALAZINE  100 mg Oral TID   isosorbide mononitrate  60 mg Oral BID   levothyroxine  50 mcg Oral Q0600   minoxidil  2.5 mg Oral Daily   pantoprazole  40 mg Oral QAC breakfast   sodium chloride flush  10-40 mL Intracatheter Q12H   sodium chloride flush  3 mL Intravenous Q12H   sodium zirconium cyclosilicate  10 g Oral TID   trihexyphenidyl  2 mg Oral BID   acetaminophen **OR** acetaminophen, alteplase, anticoagulant sodium citrate, heparin, hydrALAZINE, ipratropium-albuterol, lidocaine (PF), lidocaine-prilocaine, pentafluoroprop-tetrafluoroeth, polyethylene glycol  Assessment/ Plan:  Todd Brown is a 62 y.o.  male ith past medical conditions including COPD, hypertension, DVT, schizophrenia, and end-stage renal disease on hemodialysis.  Patient presents to the emergency department with somnolence. Patient has been admitted for ESRD on dialysis ([place])  [N18.6, Z99.2] Acute encephalopathy [G93.40] Altered mental status, unspecified altered mental status type [R41.82] Sepsis with encephalopathy without septic shock, due to unspecified organism ([place]) [A41.9, R65.20, G93.41] Bacteremia [R78.81]  [facility] [place]/MWF/left aVF  End stage renal disease on hemodialysis. Streptococcus was detected and vascular removed CVC on 06/21/22.   Next dialysis treatment scheduled for Monday.  2. Anemia of chronic kidney disease Lab Results  Component Value Date   HGB 8.5 (L) 06/28/2022  Patient receives Mircera in outpatient clinic. Hemoglobin below desired target.  Will continue with EPO with dialysis treatments.  3. Secondary Hyperparathyroidism: with outpatient labs: PTH 459, phosphorus 3.4, calcium 9.7 on 06/08/22.   Lab Results  Component Value Date   PTH 194 (H) 05/11/2017   CALCIUM 8.9 06/28/2022   CAION 1.19 03/27/2022   PHOS 5.0 (H) 06/22/2022  Calcium and phosphorus acceptable.  Will continue to monitor bone minerals during this admission.  4.  Hypertension with chronic kidney disease.  Home regimen includes amlodipine, clonidine, hydralazine, irbesartan, isosorbide, doxazosin and minoxidil.  Clonidine currently held.  Blood pressure blood pressure remains slightly elevated today, 164/74.  Will continue to monitor.  5. 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 Prescribed cefazoline.  ID recommending cefazolin 2g/2g/2g with dialysis at discharge. Plans to pursue TEE outpatient   LOS: 9   1/28/202411:13 AM

## 2022-06-29 LAB — COMPREHENSIVE METABOLIC PANEL
ALT: <5 U/L (ref 0–44)
AST: 20 U/L (ref 15–41)
Albumin: 3.1 g/dL — ABNORMAL LOW (ref 3.5–5.0)
Alkaline Phosphatase: 82 U/L (ref 38–126)
Anion gap: 13 (ref 5–15)
BUN: 57 mg/dL — ABNORMAL HIGH (ref 8–23)
CO2: 27 mmol/L (ref 22–32)
Calcium: 9.1 mg/dL (ref 8.9–10.3)
Chloride: 93 mmol/L — ABNORMAL LOW (ref 98–111)
Creatinine, Ser: 7.25 mg/dL — ABNORMAL HIGH (ref 0.61–1.24)
GFR, Estimated: 8 mL/min — ABNORMAL LOW (ref >60)
Glucose, Bld: 84 mg/dL (ref 70–99)
Potassium: 5.8 mmol/L — ABNORMAL HIGH (ref 3.5–5.1)
Sodium: 133 mmol/L — ABNORMAL LOW (ref 135–145)
Total Bilirubin: 0.6 mg/dL (ref 0.3–1.2)
Total Protein: 6.1 g/dL — ABNORMAL LOW (ref 6.5–8.1)

## 2022-06-29 LAB — CBC
HCT: 26.4 % — ABNORMAL LOW (ref 39.0–52.0)
Hemoglobin: 8.9 g/dL — ABNORMAL LOW (ref 13.0–17.0)
MCH: 34.1 pg — ABNORMAL HIGH (ref 26.0–34.0)
MCHC: 33.7 g/dL (ref 30.0–36.0)
MCV: 101.1 fL — ABNORMAL HIGH (ref 80.0–100.0)
Platelets: 209 10*3/uL (ref 150–400)
RBC: 2.61 MIL/uL — ABNORMAL LOW (ref 4.22–5.81)
RDW: 16.3 % — ABNORMAL HIGH (ref 11.5–15.5)
WBC: 7.1 10*3/uL (ref 4.0–10.5)
nRBC: 0.3 % — ABNORMAL HIGH (ref 0.0–0.2)

## 2022-06-29 LAB — C-REACTIVE PROTEIN: CRP: 1.5 mg/dL — ABNORMAL HIGH (ref <1.0)

## 2022-06-29 LAB — MAGNESIUM: Magnesium: 2 mg/dL (ref 1.7–2.4)

## 2022-06-29 MED ORDER — IRBESARTAN 150 MG PO TABS
150.0000 mg | ORAL_TABLET | Freq: Every evening | ORAL | Status: DC
  Administered 2022-06-29: 150 mg via ORAL
  Filled 2022-06-29: qty 1

## 2022-06-29 MED ORDER — EPOETIN ALFA 4000 UNIT/ML IJ SOLN
INTRAMUSCULAR | Status: AC
  Administered 2022-06-29: 4000 [IU]
  Filled 2022-06-29: qty 1

## 2022-06-29 NOTE — Progress Notes (Signed)
[facility]  ROUNDING NOTE   Subjective:   Todd Brown is a 62 year old male with past medical conditions including COPD, hypertension, DVT, schizophrenia, and end-stage renal disease on hemodialysis.  Patient presents to the emergency department with somnolence. Patient has been admitted for ESRD on dialysis ([place]) [N18.6, Z99.2] Acute encephalopathy [G93.40] Altered mental status, unspecified altered mental status type [R41.82] Sepsis with encephalopathy without septic shock, due to unspecified organism ([place]) [A41.9, R65.20, G93.41] Bacteremia [R78.81]  Patient is known to our practice and receives outpatient dialysis treatments at [facility] on a MWF schedule, supervised by Dr. Holley Raring.    Patient seen during dialysis Tolerating well    HEMODIALYSIS FLOWSHEET:  Blood Flow Rate (mL/min): 300 mL/min Arterial Pressure (mmHg): -90 mmHg Venous Pressure (mmHg): 210 mmHg TMP (mmHg): 22 mmHg Ultrafiltration Rate (mL/min): 972 mL/min Dialysate Flow Rate (mL/min): 300 ml/min Dialysis Fluid Bolus: Normal Saline Bolus Amount (mL): 300 mL     Objective:  Vital signs in last 24 hours:  Temp:  [97.9 F (36.6 C)-98.6 F (37 C)] 98.5 F (36.9 C) (01/29 0804) Pulse Rate:  [61-81] 61 (01/29 0930) Resp:  [13-17] 13 (01/29 0930) BP: (130-168)/(70-123) 154/84 (01/29 0930) SpO2:  [94 %-98 %] 97 % (01/29 0930) Weight:  [79.2 kg-80.2 kg] 79.2 kg (01/29 0804)  Weight change: 0.2 kg Filed Weights   06/28/22 0500 06/29/22 0500 06/29/22 0804  Weight: 80 kg 80.2 kg 79.2 kg    Intake/Output: I/O last 3 completed shifts: In: 120 [P.O.:120] Out: -    Intake/Output this shift:  No intake/output data recorded.  Physical Exam: General: NAD  Head: Normocephalic, atraumatic. Moist oral mucosal membranes  Eyes: Anicteric  Lungs:  Clear to auscultation, normal effort, room air  Heart: Regular rate and rhythm  Abdomen:  Soft, tender, mild distension  Extremities:  Trace  peripheral edema.  Neurologic: Nonfocal, moving all four extremities  Skin: No lesions  Access: Lt AVF    Basic Metabolic Panel: Recent Labs  Lab 06/25/22 0155 06/26/22 0529 06/27/22 0406 06/28/22 0613 06/29/22 0255  NA 134* 134* 134* 131* 133*  K 4.4 5.3* 4.8 5.4* 5.8*  CL 96* 96* 95* 97* 93*  CO2 29 29 28 29 27   GLUCOSE 91 78 82 74 84  BUN 34* 48* 34* 45* 57*  CREATININE 4.54* 6.15* 4.70* 6.09* 7.25*  CALCIUM 8.6* 8.9 9.0 8.9 9.1  MG 1.7 1.8 1.9 2.0 2.0     Liver Function Tests: Recent Labs  Lab 06/26/22 0529 06/27/22 0406 06/28/22 0613 06/29/22 0255  AST 22 26 24 20   ALT 7 6 <5 <5  ALKPHOS 66 89 78 82  BILITOT 0.7 0.6 0.7 0.6  PROT 5.7* 6.3* 5.9* 6.1*  ALBUMIN 2.9* 3.0* 3.0* 3.1*    No results for input(s): "LIPASE", "AMYLASE" in the last 168 hours. No results for input(s): "AMMONIA" in the last 168 hours.  CBC: Recent Labs  Lab 06/25/22 0155 06/26/22 0529 06/27/22 0406 06/28/22 0613 06/29/22 0255  WBC 6.3 5.1 6.4 5.4 7.1  HGB 8.1* 8.2* 9.5* 8.5* 8.9*  HCT 24.7* 25.0* 28.5* 25.5* 26.4*  MCV 102.5* 102.0* 101.4* 101.6* 101.1*  PLT 139* 158 189 190 209     Cardiac Enzymes: No results for input(s): "CKTOTAL", "CKMB", "CKMBINDEX", "TROPONINI" in the last 168 hours.  BNP: Invalid input(s): "POCBNP"  CBG: Recent Labs  Lab 06/24/22 Andrew*     Microbiology: Results for orders placed or performed during the hospital encounter of 06/19/22  Resp  panel by RT-PCR (RSV, Flu A&B, Covid) Anterior Nasal Swab     Status: None   Collection Time: 06/19/22 10:31 AM   Specimen: Anterior Nasal Swab  Result Value Ref Range Status   SARS Coronavirus 2 by RT PCR NEGATIVE NEGATIVE Final    Comment: (NOTE) SARS-CoV-2 target nucleic acids are NOT DETECTED.  The SARS-CoV-2 RNA is generally detectable in upper respiratory specimens during the acute phase of infection. The lowest concentration of SARS-CoV-2 viral copies this assay can detect is 138  copies/mL. A negative result does not preclude SARS-Cov-2 infection and should not be used as the sole basis for treatment or other patient management decisions. A negative result may occur with  improper specimen collection/handling, submission of specimen other than nasopharyngeal swab, presence of viral mutation(s) within the areas targeted by this assay, and inadequate number of viral copies(<138 copies/mL). A negative result must be combined with clinical observations, patient history, and epidemiological information. The expected result is Negative.  Fact Sheet for Patients:  EntrepreneurPulse.com.au  Fact Sheet for Healthcare Providers:  IncredibleEmployment.be  This test is no t yet approved or cleared by the [place] FDA and  has been authorized for detection and/or diagnosis of SARS-CoV-2 by FDA under an Emergency Use Authorization (EUA). This EUA will remain  in effect (meaning this test can be used) for the duration of the COVID-19 declaration under Section 564(b)(1) of the Act, 21 U.S.C.section 360bbb-3(b)(1), unless the authorization is terminated  or revoked sooner.       Influenza A by PCR NEGATIVE NEGATIVE Final   Influenza B by PCR NEGATIVE NEGATIVE Final    Comment: (NOTE) The Xpert Xpress SARS-CoV-2/FLU/RSV plus assay is intended as an aid in the diagnosis of influenza from Nasopharyngeal swab specimens and should not be used as a sole basis for treatment. Nasal washings and aspirates are unacceptable for Xpert Xpress SARS-CoV-2/FLU/RSV testing.  Fact Sheet for Patients: EntrepreneurPulse.com.au  Fact Sheet for Healthcare Providers: IncredibleEmployment.be  This test is not yet approved or cleared by the [place] FDA and has been authorized for detection and/or diagnosis of SARS-CoV-2 by FDA under an Emergency Use Authorization (EUA). This EUA will remain in effect (meaning  this test can be used) for the duration of the COVID-19 declaration under Section 564(b)(1) of the Act, 21 U.S.C. section 360bbb-3(b)(1), unless the authorization is terminated or revoked.     Resp Syncytial Virus by PCR NEGATIVE NEGATIVE Final    Comment: (NOTE) Fact Sheet for Patients: EntrepreneurPulse.com.au  Fact Sheet for Healthcare Providers: IncredibleEmployment.be  This test is not yet approved or cleared by the [place] FDA and has been authorized for detection and/or diagnosis of SARS-CoV-2 by FDA under an Emergency Use Authorization (EUA). This EUA will remain in effect (meaning this test can be used) for the duration of the COVID-19 declaration under Section 564(b)(1) of the Act, 21 U.S.C. section 360bbb-3(b)(1), unless the authorization is terminated or revoked.  Performed at [facility], [address] [place], [place] [address]   Blood Culture (routine x 2)     Status: Abnormal   Collection Time: 06/19/22 10:39 AM   Specimen: BLOOD  Result Value Ref Range Status   Specimen Description   Final    BLOOD RIGHT [facility] Performed at [facility], [address], [place], [place] [address]    Special Requests   Final    BOTTLES DRAWN AEROBIC AND ANAEROBIC Blood Culture adequate volume Performed at [facility], [place],  [place], [place] [address]    Culture  Setup Time   Final    Organism ID to follow IN BOTH AEROBIC AND ANAEROBIC BOTTLES GRAM POSITIVE COCCI CRITICAL RESULT CALLED TO, READ BACK BY AND VERIFIED WITH: JASON ROBBINS 06/19/2022 AT 2147 SRR    Culture STREPTOCOCCUS GALLOLYTICUS (A)  Final   Report Status 06/22/2022 FINAL  Final   Organism ID, Bacteria STREPTOCOCCUS GALLOLYTICUS  Final      Susceptibility   Streptococcus gallolyticus - MIC*    PENICILLIN 0.12 SENSITIVE Sensitive     CEFTRIAXONE 0.25 SENSITIVE Sensitive     ERYTHROMYCIN >=8 RESISTANT Resistant      LEVOFLOXACIN 2 SENSITIVE Sensitive     VANCOMYCIN 0.5 SENSITIVE Sensitive     * STREPTOCOCCUS GALLOLYTICUS  Blood Culture ID Panel (Reflexed)     Status: Abnormal   Collection Time: 06/19/22 10:39 AM  Result Value Ref Range Status   Enterococcus faecalis NOT DETECTED NOT DETECTED Final   Enterococcus Faecium NOT DETECTED NOT DETECTED Final   Listeria monocytogenes NOT DETECTED NOT DETECTED Final   Staphylococcus species NOT DETECTED NOT DETECTED Final   Staphylococcus aureus (BCID) NOT DETECTED NOT DETECTED Final   Staphylococcus epidermidis NOT DETECTED NOT DETECTED Final   Staphylococcus lugdunensis NOT DETECTED NOT DETECTED Final   Streptococcus species DETECTED (A) NOT DETECTED Final    Comment: Not Enterococcus species, Streptococcus agalactiae, Streptococcus pyogenes, or Streptococcus pneumoniae. CRITICAL RESULT CALLED TO, READ BACK BY AND VERIFIED WITH: JASON ROBBINS 06/19/2022 AT 2147 SRR    Streptococcus agalactiae NOT DETECTED NOT DETECTED Final   Streptococcus pneumoniae NOT DETECTED NOT DETECTED Final   Streptococcus pyogenes NOT DETECTED NOT DETECTED Final   A.calcoaceticus-baumannii NOT DETECTED NOT DETECTED Final   Bacteroides fragilis NOT DETECTED NOT DETECTED Final   Enterobacterales NOT DETECTED NOT DETECTED Final   Enterobacter cloacae complex NOT DETECTED NOT DETECTED Final   Escherichia coli NOT DETECTED NOT DETECTED Final   Klebsiella aerogenes NOT DETECTED NOT DETECTED Final   Klebsiella oxytoca NOT DETECTED NOT DETECTED Final   Klebsiella pneumoniae NOT DETECTED NOT DETECTED Final   Proteus species NOT DETECTED NOT DETECTED Final   Salmonella species NOT DETECTED NOT DETECTED Final   Serratia marcescens NOT DETECTED NOT DETECTED Final   Haemophilus influenzae NOT DETECTED NOT DETECTED Final   Neisseria meningitidis NOT DETECTED NOT DETECTED Final   Pseudomonas aeruginosa NOT DETECTED NOT DETECTED Final   Stenotrophomonas maltophilia NOT DETECTED NOT DETECTED  Final   Candida albicans NOT DETECTED NOT DETECTED Final   Candida auris NOT DETECTED NOT DETECTED Final   Candida glabrata NOT DETECTED NOT DETECTED Final   Candida krusei NOT DETECTED NOT DETECTED Final   Candida parapsilosis NOT DETECTED NOT DETECTED Final   Candida tropicalis NOT DETECTED NOT DETECTED Final   Cryptococcus neoformans/gattii NOT DETECTED NOT DETECTED Final    Comment: Performed at [facility], [address] [place], [place] [address]  Blood Culture (routine x 2)     Status: Abnormal   Collection Time: 06/19/22 10:49 AM   Specimen: BLOOD  Result Value Ref Range Status   Specimen Description   Final    BLOOD RIGHT [facility] Performed at [facility] [address], [place], [place] [address]    Special Requests   Final    BOTTLES DRAWN AEROBIC AND ANAEROBIC Blood Culture adequate volume Performed at [facility], [address] [place], [place] [address]    Culture  Setup Time   Final    IN  BOTH AEROBIC AND ANAEROBIC BOTTLES GRAM POSITIVE COCCI CRITICAL RESULT CALLED TO, READ BACK BY AND VERIFIED WITH: JASON ROBBINS 06/19/2022 AT 2147 SRR    Culture (A)  Final    STREPTOCOCCUS GALLOLYTICUS SUSCEPTIBILITIES PERFORMED ON PREVIOUS CULTURE WITHIN THE LAST 5 DAYS. Performed at [facility], [place] [address], [place], [place] [address]    Report Status 06/22/2022 FINAL  Final  Culture, blood (Routine X 2) w Reflex to ID Panel     Status: None   Collection Time: 06/19/22  7:40 PM   Specimen: BLOOD  Result Value Ref Range Status   Specimen Description BLOOD BLOOD RIGHT HAND  Final   Special Requests   Final    BOTTLES DRAWN AEROBIC ONLY Blood Culture results may not be optimal due to an inadequate volume of blood received in culture bottles   Culture   Final    NO GROWTH 5 DAYS Performed at [facility], [name] [place], [place] [address]    Report Status 06/24/2022 FINAL  Final  Culture, blood (Routine X 2) w  Reflex to ID Panel     Status: None   Collection Time: 06/19/22  7:40 PM   Specimen: BLOOD  Result Value Ref Range Status   Specimen Description BLOOD THUMB RIGHT  Final   Special Requests IN PEDIATRIC BOTTLE Blood Culture adequate volume  Final   Culture   Final    NO GROWTH 5 DAYS Performed at [facility], [address] [place], [place] [address]    Report Status 06/24/2022 FINAL  Final  Cath Tip Culture     Status: Abnormal   Collection Time: 06/20/22 11:24 AM   Specimen: Catheter Tip; Other  Result Value Ref Range Status   Specimen Description   Final    CATH TIP Performed at [facility], [address] [place], [place] [address]    Special Requests   Final    NONE Performed at [facility], [address] [place], [place] [address]    Culture STREPTOCOCCUS GALLOLYTICUS (A)  Final   Report Status 06/24/2022 FINAL  Final   Organism ID, Bacteria STREPTOCOCCUS GALLOLYTICUS  Final      Susceptibility   Streptococcus gallolyticus - MIC*    PENICILLIN 0.12 SENSITIVE Sensitive     CEFTRIAXONE <=0.12 SENSITIVE Sensitive     ERYTHROMYCIN >=8 RESISTANT Resistant     LEVOFLOXACIN 2 SENSITIVE Sensitive     VANCOMYCIN 0.5 SENSITIVE Sensitive     * STREPTOCOCCUS GALLOLYTICUS  Culture, blood (Routine X 2) w Reflex to ID Panel     Status: None   Collection Time: 06/23/22  1:34 PM   Specimen: BLOOD  Result Value Ref Range Status   Specimen Description BLOOD BLOOD RIGHT FOREARM  Final   Special Requests   Final    BOTTLES DRAWN AEROBIC AND ANAEROBIC Blood Culture results may not be optimal due to an excessive volume of blood received in culture bottles   Culture   Final    NO GROWTH 5 DAYS Performed at [facility], [address] [place], [place] [address]    Report Status 06/28/2022 FINAL  Final  Culture, blood (Routine X 2) w Reflex to ID Panel     Status: None   Collection Time: 06/23/22  1:39 PM   Specimen: BLOOD  Result Value Ref  Range Status   Specimen Description BLOOD BLOOD RIGHT HAND  Final   Special Requests   Final  BOTTLES DRAWN AEROBIC AND ANAEROBIC Blood Culture adequate volume   Culture   Final    NO GROWTH 5 DAYS Performed at [facility], [address] [place], [place] [address]    Report Status 06/28/2022 FINAL  Final    Coagulation Studies: No results for input(s): "LABPROT", "INR" in the last 72 hours.   Urinalysis: No results for input(s): "COLORURINE", "LABSPEC", "PHURINE", "GLUCOSEU", "HGBUR", "BILIRUBINUR", "KETONESUR", "PROTEINUR", "UROBILINOGEN", "NITRITE", "LEUKOCYTESUR" in the last 72 hours.  Invalid input(s): "APPERANCEUR"    Imaging: No results found.   Medications:    anticoagulant sodium citrate      ceFAZolin (ANCEF) IV Stopped (06/26/22 1251)    amLODipine  10 mg Oral q morning   calcium acetate  1,334 mg Oral TID WC   carbamazepine  1,000 mg Oral q morning   Chlorhexidine Gluconate Cloth  6 each Topical Q0600   doxazosin  4 mg Oral QHS   epoetin (EPOGEN/PROCRIT) injection  4,000 Units Intravenous Q M,W,F-HD   fluPHENAZine  2.5 mg Oral Daily   heparin  5,000 Units Subcutaneous Q8H   hydrALAZINE  100 mg Oral TID   isosorbide mononitrate  60 mg Oral BID   levothyroxine  50 mcg Oral Q0600   minoxidil  2.5 mg Oral Daily   pantoprazole  40 mg Oral QAC breakfast   sodium chloride flush  10-40 mL Intracatheter Q12H   sodium chloride flush  3 mL Intravenous Q12H   sodium zirconium cyclosilicate  10 g Oral TID   trihexyphenidyl  2 mg Oral BID   acetaminophen **OR** acetaminophen, alteplase, anticoagulant sodium citrate, heparin, hydrALAZINE, ipratropium-albuterol, lidocaine (PF), lidocaine-prilocaine, pentafluoroprop-tetrafluoroeth, polyethylene glycol  Assessment/ Plan:  [name] is a 62 y.o.  male ith past medical conditions including COPD, hypertension, DVT, schizophrenia, and end-stage renal disease on hemodialysis.  Patient presents to the  emergency department with somnolence. Patient has been admitted for ESRD on dialysis ([place]) [N18.6, Z99.2] Acute encephalopathy [G93.40] Altered mental status, unspecified altered mental status type [R41.82] Sepsis with encephalopathy without septic shock, due to unspecified organism ([place]) [A41.9, R65.20, G93.41] Bacteremia [R78.81]  [facility]/MWF/left aVF  End stage renal disease on hemodialysis. Streptococcus was detected and vascular removed CVC on 06/21/22.   HD schedule MWF  2. Anemia of chronic kidney disease Lab Results  Component Value Date   HGB 8.9 (L) 06/29/2022  Patient receives Mircera in outpatient clinic. Hemoglobin below desired target.  Will continue with EPO with dialysis treatments.  3. Secondary Hyperparathyroidism: with outpatient labs: PTH 459, phosphorus 3.4, calcium 9.7 on 06/08/22.   Lab Results  Component Value Date   PTH 194 (H) 05/11/2017   CALCIUM 9.1 06/29/2022   CAION 1.19 03/27/2022   PHOS 5.0 (H) 06/22/2022  Calcium and phosphorus acceptable.  Will continue to monitor bone minerals during this admission.  4.  Hypertension with chronic kidney disease.  Home regimen includes amlodipine, clonidine, hydralazine, irbesartan, isosorbide, doxazosin and minoxidil.    Start Irbesartan  5. 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 Prescribed cefazoline.  ID recommending cefazolin 2g/2g/3g with dialysis at discharge. Plans to pursue TEE outpatient  6. Hyperkalemia - low K diet - managed with HD - Lokelma x 2 days - may need to consider 1 K bath   LOS: 10 Todd Brown 1/29/20249:36 AM

## 2022-06-29 NOTE — Progress Notes (Signed)
  Progress Note   Patient: Todd Brown TGG:[id number] DOB: 12/25/1960 DOA: 06/19/2022     10 DOS: the patient was seen and examined on 06/29/2022   Brief hospital course:  Assessment and Plan:  * SIRS (systemic inflammatory response syndrome) (HCC)/sepsis due to dialysis catheter  - IV cefazolin 2g MWF with dialysis for strep gallolyticus bacteremia (END DATE Jul 20 2022) - Spoke with guardian for consent for TEE and colonoscopy on 06/24/2022 but decision has not been made yet. Furthermore, the pt himself is not agreeable for these procedures     NSTEMI (non-ST elevated myocardial infarction) ([place]) - Monitor    Acute encephalopathy - Pt appears at his baseline  - Tegretol 1000 mg PO daily  - Synthroid 50 mcg PO daily    ESRD on dialysis ([place]) - Dialysis per nephrology  - Phoslo 1334 mg PO tid  - Artane 2 mg PO bid    Essential hypertension - Norvasc 10 mg PO daily  - Hydralazine 100 mg PO tid  - Imdur 60 mg PO bid  - Doxazosin 4 mg PO daily  - Minoxidil 2.5 mg PO daily    COPD (chronic obstructive pulmonary disease) (HCC) - Duoneb q6 hr PRN  - Robitussin DM tid for 3 days   Acute pulmonary edema - Dialysis as above    Hyperkalemia  - Lokelma 10 g PO tid    DVT prophylaxis: Heparin 5000 units sq q8hr  GI prophylaxis: Protonix 40 mg PO daily       Subjective: Pt seen and examined at the bedside. He received dialysis today. Case management working towards SNF such that the pt can continue his IV antibx until Jul 20 2022.  Physical Exam: Vitals:   06/29/22 0830 06/29/22 0900 06/29/22 0930 06/29/22 1000  BP: (!) 150/90 (!) 158/85 (!) 154/84 (!) 183/84  Pulse: 63 62 61 77  Resp: 13 13 13 18   Temp:      TempSrc:      SpO2: 98% 97% 97% 94%  Weight:      Height:       HENT:     Head: Normocephalic and atraumatic.  Cardiovascular:     Rate and Rhythm: Normal rate and regular rhythm.  Pulmonary:     Effort: Pulmonary effort is normal.  Abdominal:      Palpations: Abdomen is soft.  Skin:    General: Skin is warm.  Neurological:     Mental Status: He is alert.     Comments: Awake and alert   Psychiatric:        Mood and Affect: Mood normal.   Data Reviewed:   Disposition: Status is: Inpatient  Planned Discharge Destination: Skilled nursing facility    Time spent: 35 minutes  Author: Lucienne Minks , MD 06/29/2022 10:20 AM  For on call review [email or web address].

## 2022-06-29 NOTE — Plan of Care (Signed)
  Problem: Education: Goal: Knowledge of risk factors and measures for prevention of condition will improve Outcome: Progressing   Problem: Coping: Goal: Psychosocial and spiritual needs will be supported Outcome: Progressing   Problem: Respiratory: Goal: Will maintain a patent airway Outcome: Progressing   Problem: Education: Goal: Knowledge of General Education information will improve Description: Including pain rating scale, medication(s)/side effects and non-pharmacologic comfort measures Outcome: Progressing   Problem: Health Behavior/Discharge Planning: Goal: Ability to manage health-related needs will improve Outcome: Progressing   Problem: Clinical Measurements: Goal: Ability to maintain clinical measurements within normal limits will improve Outcome: Progressing   Problem: Nutrition: Goal: Adequate nutrition will be maintained Outcome: Progressing   Problem: Coping: Goal: Level of anxiety will decrease Outcome: Progressing   Problem: Pain Managment: Goal: General experience of comfort will improve Outcome: Progressing

## 2022-06-30 LAB — COMPREHENSIVE METABOLIC PANEL
ALT: <5 U/L (ref 0–44)
AST: 21 U/L (ref 15–41)
Albumin: 2.8 g/dL — ABNORMAL LOW (ref 3.5–5.0)
Alkaline Phosphatase: 86 U/L (ref 38–126)
Anion gap: 7 (ref 5–15)
BUN: 39 mg/dL — ABNORMAL HIGH (ref 8–23)
CO2: 30 mmol/L (ref 22–32)
Calcium: 8.7 mg/dL — ABNORMAL LOW (ref 8.9–10.3)
Chloride: 93 mmol/L — ABNORMAL LOW (ref 98–111)
Creatinine, Ser: 5.48 mg/dL — ABNORMAL HIGH (ref 0.61–1.24)
GFR, Estimated: 11 mL/min — ABNORMAL LOW (ref >60)
Glucose, Bld: 132 mg/dL — ABNORMAL HIGH (ref 70–99)
Potassium: 4.3 mmol/L (ref 3.5–5.1)
Sodium: 130 mmol/L — ABNORMAL LOW (ref 135–145)
Total Bilirubin: 0.7 mg/dL (ref 0.3–1.2)
Total Protein: 5.8 g/dL — ABNORMAL LOW (ref 6.5–8.1)

## 2022-06-30 LAB — CBC
HCT: 26.8 % — ABNORMAL LOW (ref 39.0–52.0)
Hemoglobin: 8.9 g/dL — ABNORMAL LOW (ref 13.0–17.0)
MCH: 34 pg (ref 26.0–34.0)
MCHC: 33.2 g/dL (ref 30.0–36.0)
MCV: 102.3 fL — ABNORMAL HIGH (ref 80.0–100.0)
Platelets: 228 10*3/uL (ref 150–400)
RBC: 2.62 MIL/uL — ABNORMAL LOW (ref 4.22–5.81)
RDW: 16.3 % — ABNORMAL HIGH (ref 11.5–15.5)
WBC: 5.9 10*3/uL (ref 4.0–10.5)
nRBC: 0 % (ref 0.0–0.2)

## 2022-06-30 LAB — MAGNESIUM: Magnesium: 1.8 mg/dL (ref 1.7–2.4)

## 2022-06-30 LAB — C-REACTIVE PROTEIN: CRP: 1 mg/dL — ABNORMAL HIGH (ref <1.0)

## 2022-06-30 MED ORDER — CEFAZOLIN IV (FOR PTA / DISCHARGE USE ONLY): 2.0000 g | INTRAVENOUS | 0 refills | Status: AC

## 2022-06-30 MED ORDER — LEVOTHYROXINE SODIUM 50 MCG PO TABS: 50.0000 ug | ORAL_TABLET | Freq: Every day | ORAL | 0 refills | Status: DC

## 2022-06-30 NOTE — TOC Transition Note (Signed)
Transition of Care [facility]) - CM/SW Discharge Note   Patient Details  Name: Todd Brown MRN: [id number] Date of Birth: [birth year]  [facility]) CM/SW Contact:  Tiburcio Bash, LCSW Phone Number: 06/30/2022, 11:10 AM   Clinical Narrative:     Patient will dc to [facility] today, spoke with owner [name] at [phone] who confirms patient can return today and get antibiotics during HD, requested fl2 and dc summary be sent to [email or web address] and reqeusted EMS Transport for patient back to home, confirmed address in chart as accurate. CSW has arranged EMS and all paperwork emailed to [place] [facility], no other dc needs. Patient on 2L O2 baseline.    Final next level of care: Assisted Living Barriers to Discharge: No Barriers Identified   Patient Goals and CMS Choice CMS Medicare.gov Compare Post Acute Care list provided to:: Patient Choice offered to / list presented to : Patient  Discharge Placement                         Discharge Plan and Services Additional resources added to the After Visit Summary for                                       Social Determinants of Health (SDOH) Interventions SDOH Screenings   Food Insecurity: No Food Insecurity (10/08/2018)  Transportation Needs: No Transportation Needs (10/08/2018)  Alcohol Screen: Low Risk  (10/05/2019)  Financial Resource Strain: Low Risk  (10/08/2018)  Physical Activity: Unknown (10/08/2018)  Social Connections: Unknown (10/08/2018)  Stress: No Stress Concern Present (10/08/2018)  Tobacco Use: Low Risk  (06/19/2022)     Readmission Risk Interventions     No data to display

## 2022-06-30 NOTE — Plan of Care (Signed)
  Problem: Education: Goal: Knowledge of risk factors and measures for prevention of condition will improve Outcome: Progressing   Problem: Coping: Goal: Psychosocial and spiritual needs will be supported Outcome: Progressing   Problem: Respiratory: Goal: Will maintain a patent airway Outcome: Progressing   Problem: Education: Goal: Knowledge of General Education information will improve Description: Including pain rating scale, medication(s)/side effects and non-pharmacologic comfort measures Outcome: Progressing   Problem: Health Behavior/Discharge Planning: Goal: Ability to manage health-related needs will improve Outcome: Progressing   Problem: Clinical Measurements: Goal: Ability to maintain clinical measurements within normal limits will improve Outcome: Progressing   Problem: Activity: Goal: Risk for activity intolerance will decrease Outcome: Progressing

## 2022-06-30 NOTE — NC FL2 (Addendum)
[place] LEVEL OF CARE FORM     IDENTIFICATION  Patient Name: Todd Brown Birthdate: 1960-11-18 Sex: male Admission Date (Current Location): 06/19/2022  [place] and [place] Number:  Engineering geologist and Address:  [facility], [address] [place], [place] [address]      Provider Number: [id number]  Attending Physician Name and Address:  Lucienne Minks, MD  Relative Name and Phone Number:  Candice  [phone]    Current Level of Care: Hospital Recommended Level of Care: [place] Prior Approval Number:    Date Approved/Denied:   PASRR Number:    Discharge Plan: Other (Comment) [name])    Current Diagnoses: Patient Active Problem List   Diagnosis Date Noted   Bacteremia 06/20/2022   Acute encephalopathy 06/19/2022   SIRS (systemic inflammatory response syndrome) ([place]) 06/19/2022   NSTEMI (non-ST elevated myocardial infarction) ([place]) 06/19/2022   Acute on chronic respiratory failure with hypoxia ([place]) 05/21/2022   Hypoxia 05/20/2022   COVID-19 virus infection 05/20/2022   Hyponatremia 02/10/2022   Anemia due to chronic kidney disease 01/21/2022   Hyperkalemia 01/14/2022   Malnutrition of moderate degree 12/24/2021   Oral thrush 12/22/2021   Physical deconditioning 12/20/2021   Mental health disorder 12/19/2021   Essential hypertension 12/15/2021   ESRD on dialysis ([place]) 12/15/2021   Altered mental status 10/08/2021   Hypertensive urgency 10/07/2021   COPD (chronic obstructive pulmonary disease) ([place]) 02/26/2021   GERD (gastroesophageal reflux disease) 02/26/2021   Hypertension 11/28/2020   Acute respiratory failure with hypoxia ([place]) 10/23/2019   Anemia of chronic disease 10/23/2019   Fluid overload 10/08/2018   Acute pulmonary edema ([place]) 09/12/2018   Scrotal edema 05/10/2017   Symptomatic anemia 04/30/2017   Anticoagulated on Coumadin 09/14/2016   Cardiac arrest ([place]) 08/03/2016    Sepsis ([place]) 05/26/2016   Dialysis patient ([place]) 04/15/2016   End stage renal disease ([place]) 02/12/2016   Hyperkalemia 05/20/2015   Hepatitis C 09/26/2013   Schizophrenia ([place]) 03/23/2013   Essential hypertension, benign 03/23/2013   Chronic kidney disease 03/23/2013    Orientation RESPIRATION BLADDER Height & Weight     Self, Time, Place  2L nasal cannula (baseline) Continent Weight: 177 lb 0.5 oz (80.3 kg) Height:  5\' 11"  (180.3 cm)  BEHAVIORAL SYMPTOMS/MOOD NEUROLOGICAL BOWEL NUTRITION STATUS      Continent Diet  AMBULATORY STATUS COMMUNICATION OF NEEDS Skin   Limited Assist Verbally Normal                       Personal Care Assistance Level of Assistance  Feeding, Bathing, Total care, Dressing Bathing Assistance: Limited assistance Feeding assistance: Independent Dressing Assistance: Limited assistance Total Care Assistance: Limited assistance   Functional Limitations Info  Sight, Hearing, Speech Sight Info: Impaired Hearing Info: Adequate Speech Info: Adequate    SPECIAL CARE FACTORS FREQUENCY                       Contractures Contractures Info: Not present    Additional Factors Info  Code Status, Allergies Code Status Info: full Allergies Info: Chlorpromazine           Current Medications (06/30/2022):  This is the current hospital active medication list Current Facility-Administered Medications  Medication Dose Route Frequency Provider Last Rate Last Admin   acetaminophen (TYLENOL) tablet 650 mg  650 mg Oral Q6H PRN Jose Persia, MD   650 mg at 06/20/22 2207   Or  acetaminophen (TYLENOL) suppository 650 mg  650 mg Rectal Q6H PRN Jose Persia, MD       alteplase (CATHFLO ACTIVASE) injection 2 mg  2 mg Intracatheter Once PRN Colon Flattery, NP       amLODipine (NORVASC) tablet 10 mg  10 mg Oral q morning Enzo Bi, MD   10 mg at 06/29/22 1407   anticoagulant sodium citrate solution 5 mL  5 mL Intracatheter PRN Colon Flattery,  NP       calcium acetate (PHOSLO) capsule 1,334 mg  1,334 mg Oral TID WC Enzo Bi, MD   1,334 mg at 06/30/22 0902   carbamazepine (TEGRETOL) tablet 1,000 mg  1,000 mg Oral q morning Enzo Bi, MD   1,000 mg at 06/30/22 1008   ceFAZolin (ANCEF) IVPB 2g/100 mL premix  2 g Intravenous Q M,W,F-HD Berton Mount, RPH   Stopped at 06/29/22 1354   Chlorhexidine Gluconate Cloth 2 % PADS 6 each  6 each Topical Q0600 Colon Flattery, NP   6 each at 06/30/22 4268   doxazosin (CARDURA) tablet 4 mg  4 mg Oral QHS Enzo Bi, MD   4 mg at 06/29/22 2309   epoetin alfa (EPOGEN) injection 4,000 Units  4,000 Units Intravenous Q M,W,F-HD Colon Flattery, NP       fluPHENAZine (PROLIXIN) tablet 2.5 mg  2.5 mg Oral Daily Enzo Bi, MD   2.5 mg at 06/30/22 0959   heparin injection 1,000 Units  1,000 Units Intracatheter PRN Colon Flattery, NP   1,000 Units at 06/20/22 1120   heparin injection 5,000 Units  5,000 Units Subcutaneous Q8H Jose Persia, MD   5,000 Units at 06/30/22 0645   hydrALAZINE (APRESOLINE) injection 10 mg  10 mg Intravenous Q6H PRN Enzo Bi, MD       hydrALAZINE (APRESOLINE) tablet 100 mg  100 mg Oral TID Enzo Bi, MD   100 mg at 06/29/22 2309   ipratropium-albuterol (DUONEB) 0.5-2.5 (3) MG/3ML nebulizer solution 3 mL  3 mL Nebulization Q6H PRN Enzo Bi, MD   3 mL at 06/21/22 2236   irbesartan (AVAPRO) tablet 150 mg  150 mg Oral QPM Murlean Iba, MD   150 mg at 06/29/22 1840   isosorbide mononitrate (IMDUR) 24 hr tablet 60 mg  60 mg Oral BID Enzo Bi, MD   60 mg at 06/30/22 1008   levothyroxine (SYNTHROID) tablet 50 mcg  50 mcg Oral Q0600 Enzo Bi, MD   50 mcg at 06/30/22 0645   lidocaine (PF) (XYLOCAINE) 1 % injection 5 mL  5 mL Intradermal PRN Colon Flattery, NP       lidocaine-prilocaine (EMLA) cream 1 Application  1 Application Topical PRN Colon Flattery, NP       minoxidil (LONITEN) tablet 2.5 mg  2.5 mg Oral Daily Enzo Bi, MD   2.5 mg at 06/29/22 1408   pantoprazole  (PROTONIX) EC tablet 40 mg  40 mg Oral QAC breakfast Enzo Bi, MD   40 mg at 06/30/22 0902   pentafluoroprop-tetrafluoroeth (GEBAUERS) aerosol 1 Application  1 Application Topical PRN Colon Flattery, NP       polyethylene glycol (MIRALAX / GLYCOLAX) packet 17 g  17 g Oral Daily PRN Jose Persia, MD       sodium chloride flush (NS) 0.9 % injection 10-40 mL  10-40 mL Intracatheter Q12H Jose Persia, MD   10 mL at 06/30/22 1009   sodium chloride flush (NS) 0.9 % injection 3 mL  3 mL Intravenous Q12H Jose Persia, MD  3 mL at 06/30/22 1008   trihexyphenidyl (ARTANE) tablet 2 mg  2 mg Oral BID Enzo Bi, MD   2 mg at 06/30/22 3403     Discharge Medications: Please see discharge summary for a list of discharge medications.  Relevant Imaging Results:  Relevant Lab Results:   Additional Information SSN: [id number]  Tiburcio Bash, LCSW

## 2022-06-30 NOTE — Discharge Summary (Addendum)
Physician Discharge Summary   Patient: Todd Brown MRN: [id number] DOB: Dec 08, 1960  Admit date:     06/19/2022  Discharge date: 06/30/22  Discharge Physician: Lucienne Minks    PCP: [name], NP   Recommendations at discharge:    Please complete the course of IV antibx on Jul 20 2022   Discharge Diagnoses: Principal Problem:   SIRS (systemic inflammatory response syndrome) ([place]) Active Problems:   NSTEMI (non-ST elevated myocardial infarction) ([place])   Acute encephalopathy   ESRD on dialysis [facility])   Essential hypertension   COPD (chronic obstructive pulmonary disease) ([place])   Bacteremia  Resolved Problems:   * No resolved hospital problems. 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 Nephrology was consulted.  Suspected source was from the PermCath (PermCath was removed during this admission). Standard ECHO was obtained which did not show any vegetation.  ID also consulted and they requested TEE and colonscopy, however, the pt did not agree to have these procedures. Pt was continued on IV cefazolin 2g MWF with dialysis for strep gallolyticus bacteremia (END DATE Jul 20 2022). Confirmed with the case manager on 06/30/2022 that the pt would be able to go back to his facility on 06/30/2022 and also that he would be able to get his IV antibx's during his dialysis sessions. Thus, discharge planned for 06/30/2022.  Please note hypoNa+ was present but not significant in this case  Assessment and Plan: * SIRS (systemic inflammatory response syndrome) (HCC) Patient is presenting with sudden onset altered mental status, fever up to 101, WBC of 14 and tachycardia up to 105 with junctional rhythm noted (no prior history of junctional rhythm.)  Etiology is uncertain at this time but includes bacteremia given indwelling dialysis catheter versus cardiac event given new rhythm and troponin leak.  No wheezing to suggest COPD exacerbation, however patient's  work of breathing is increased.  Chest x-ray not particularly diagnostic.  - Continue broad-spectrum antibiotics - Blood cultures pending - Echocardiogram - CT chest without contrast - RVP - Strep pneumo and Legionella urinary antigens  NSTEMI (non-ST elevated myocardial infarction) (HCC) Elevated troponin at 155 with upward trend to 193 on admission.  EKG with junctional rhythm, but no other ST or T wave changes to suggest ischemia.  Patient is not endorsing any chest pain.  No prior history of CAD with normal stress test in 2018.  This may be all secondary to demand in the setting of SIRS, however patient is at risk for ACS.  - Cardiology consultation - Hold on heparin infusion pending cardiology input - Echocardiogram ordered  Acute encephalopathy Likely multifactorial in the setting of acute illness.  Patient is protecting his airway at this time.  No hypercarbia on VBG.  Folate and B12 within normal limits recently  - Delirium precautions - TSH - Swallow evaluation  ESRD on dialysis [facility]) - Nephrology consulted; appreciate their recommendations - Plan for dialysis today, per nephrology  Essential hypertension - Will resume patient's home antihypertensives once able to reliably swallow  COPD (chronic obstructive pulmonary disease) ([place]) No wheezing on examination although patient's work of breathing does seem increased.  - DuoNebs every 6 hours - Restart home bronchodilators when able - Continue home supplemental oxygen        Consultants: Nephro Disposition: Skilled nursing facility Diet recommendation: Renal diet  DISCHARGE MEDICATION: Allergies as of 06/30/2022       Reactions   Chlorpromazine Other (See Comments)   Reaction:  Unknown , pt states it makes him feel real bad Reaction:  Unknown , pt states it makes him feel real bad        Medication List     STOP taking these medications    acetaminophen 500 MG tablet Commonly known as: TYLENOL    predniSONE 20 MG tablet Commonly known as: DELTASONE       TAKE these medications    albuterol 108 (90 Base) MCG/ACT inhaler Commonly known as: VENTOLIN HFA Inhale 1-2 puffs into the lungs every 4 (four) hours as needed for wheezing or shortness of breath.   amLODipine 10 MG tablet Commonly known as: NORVASC Take 10 mg by mouth every morning.   b complex-vitamin c-folic acid 0.8 MG Tabs tablet Take 1 tablet by mouth at bedtime.   calcium acetate 667 MG capsule Commonly known as: PHOSLO Take 1,334 mg by mouth 3 (three) times daily with meals.   carbamazepine 200 MG tablet Commonly known as: TEGRETOL Take 1,000 mg by mouth every morning.   ceFAZolin  IVPB Commonly known as: ANCEF Inject 2 g into the vein every Monday, Wednesday, and Friday for 24 days. Cefazolin 2gm IV with dialysis on Mon/Wed/Fri Indication:  S. Gallolyticus bacteremia Last Day of Therapy: 07/20/2022 Labs - Once weekly:  CBC/D and BMP, To be administered at dialysis center   cholecalciferol 1000 units tablet Commonly known as: VITAMIN D Take 1,000 Units by mouth in the morning.   cloNIDine 0.2 mg/24hr patch Commonly known as: CATAPRES - Dosed in mg/24 hr Place 1 patch (0.2 mg total) onto the skin once a week.   doxazosin 4 MG tablet Commonly known as: CARDURA Take 1 tablet (4 mg total) by mouth at bedtime.   ferrous sulfate 325 (65 FE) MG tablet Take 1 tablet (325 mg total) by mouth daily.   fluPHENAZine 2.5 MG tablet Commonly known as: PROLIXIN Take 2.5 mg by mouth in the morning.   Fluticasone-Salmeterol 250-50 MCG/DOSE Aepb Commonly known as: ADVAIR Inhale 1 puff into the lungs 2 (two) times daily.   hydrALAZINE 25 MG tablet Commonly known as: APRESOLINE Take 50 mg by mouth 3 (three) times daily.   isosorbide mononitrate 60 MG 24 hr tablet Commonly known as: IMDUR Take 60 mg by mouth 2 (two) times daily.   levothyroxine 50 MCG tablet Commonly known as: SYNTHROID Take 1 tablet (50  mcg total) by mouth daily at 6 (six) AM. Start taking on: July 01, 2022   minoxidil 2.5 MG tablet Commonly known as: LONITEN Take 1 tablet (2.5 mg total) by mouth daily.   omeprazole 40 MG capsule Commonly known as: PRILOSEC Take 40 mg by mouth every morning.   polyethylene glycol 17 g packet Commonly known as: MIRALAX / GLYCOLAX Take 17 g by mouth daily as needed for mild constipation.   promethazine-dextromethorphan 6.25-15 MG/5ML syrup Commonly known as: PROMETHAZINE-DM Take 5 mLs by mouth every 6 (six) hours as needed for cough.   trihexyphenidyl 2 MG tablet Commonly known as: ARTANE Take 2 mg by mouth 2 (two) times daily.               Home Infusion Instuctions  (From admission, onward)           Start     Ordered   06/30/22 0000  Home infusion instructions       Question:  Instructions  Answer:  Flushing of vascular access device: 0.9% NaCl pre/post medication administration and prn patency; Heparin 100 u/ml, 56ml for implanted  ports and Heparin 10u/ml, 20ml for all other central venous catheters.   06/30/22 1120            Discharge Exam: Filed Weights   06/29/22 0804 06/29/22 1224 06/30/22 0500  Weight: 79.2 kg 78.1 kg 80.3 kg     Condition at discharge: fair  The results of significant diagnostics from this hospitalization (including imaging, microbiology, ancillary and laboratory) are listed below for reference.   Imaging Studies: ECHOCARDIOGRAM COMPLETE  Result Date: 06/21/2022    ECHOCARDIOGRAM REPORT   Patient Name:   VISHNU MOELLER Date of Exam: 06/20/2022 Medical Rec #:  [id number]        Height:       71.0 in Accession #:    [id number]       Weight:       169.3 lb Date of Birth:  1960-07-24        BSA:          1.964 m Patient Age:    28 years         BP:           158/98 mmHg Patient Gender: M                HR:           72 bpm. Exam Location:  [facility] Procedure: 2D Echo, Color Doppler and Cardiac Doppler Indications:     NSTEMI  History:          Patient has prior history of Echocardiogram examinations. COPD;                  Risk Factors:Hypertension and ESRD, HLD.  Sonographer:     L. Thornton-Maynard Referring Phys:  [id number] Jose Persia Diagnosing Phys: Isaias Cowman MD IMPRESSIONS  1. Left ventricular ejection fraction, by estimation, is 60 to 65%. The left ventricle has normal function. The left ventricle has no regional wall motion abnormalities. There is mild left ventricular hypertrophy. Left ventricular diastolic parameters are consistent with Grade I diastolic dysfunction (impaired relaxation).  2. Right ventricular systolic function is normal. The right ventricular size is normal. There is severely elevated pulmonary artery systolic pressure.  3. The mitral valve is normal in structure. No evidence of mitral valve regurgitation. No evidence of mitral stenosis.  4. Tricuspid valve regurgitation is moderate to severe.  5. The aortic valve is normal in structure. Aortic valve regurgitation is mild to moderate. No aortic stenosis is present.  6. The inferior vena cava is normal in size with greater than 50% respiratory variability, suggesting right atrial pressure of 3 mmHg. FINDINGS  Left Ventricle: Left ventricular ejection fraction, by estimation, is 60 to 65%. The left ventricle has normal function. The left ventricle has no regional wall motion abnormalities. The left ventricular internal cavity size was normal in size. There is  mild left ventricular hypertrophy. Left ventricular diastolic parameters are consistent with Grade I diastolic dysfunction (impaired relaxation). Right Ventricle: The right ventricular size is normal. No increase in right ventricular wall thickness. Right ventricular systolic function is normal. There is severely elevated pulmonary artery systolic pressure. The tricuspid regurgitant velocity is 4.64 m/s, and with an assumed right atrial pressure of 15 mmHg, the estimated right ventricular systolic  pressure is 308.6 mmHg. Left Atrium: Left atrial size was normal in size. Right Atrium: Right atrial size was normal in size. Pericardium: Trivial pericardial effusion is present. Mitral Valve: The mitral valve is normal in structure. No evidence of mitral valve  regurgitation. No evidence of mitral valve stenosis. Tricuspid Valve: The tricuspid valve is normal in structure. Tricuspid valve regurgitation is moderate to severe. No evidence of tricuspid stenosis. Aortic Valve: The aortic valve is normal in structure. Aortic valve regurgitation is mild to moderate. Aortic regurgitation PHT measures 508 msec. No aortic stenosis is present. Aortic valve mean gradient measures 9.0 mmHg. Aortic valve peak gradient measures 16.2 mmHg. Aortic valve area, by VTI measures 4.10 cm. Pulmonic Valve: The pulmonic valve was normal in structure. Pulmonic valve regurgitation is not visualized. No evidence of pulmonic stenosis. Aorta: The aortic root is normal in size and structure. Venous: The inferior vena cava is normal in size with greater than 50% respiratory variability, suggesting right atrial pressure of 3 mmHg. IAS/Shunts: No atrial level shunt detected by color flow Doppler.  LEFT VENTRICLE PLAX 2D LVIDd:         5.80 cm      Diastology LVIDs:         3.50 cm      LV e' medial:    8.59 cm/s LV PW:         1.30 cm      LV E/e' medial:  14.0 LV IVS:        1.30 cm      LV e' lateral:   9.46 cm/s LVOT diam:     2.60 cm      LV E/e' lateral: 12.7 LV SV:         148 LV SV Index:   75 LVOT Area:     5.31 cm  LV Volumes (MOD) LV vol d, MOD A2C: 118.0 ml LV vol d, MOD A4C: 157.0 ml LV vol s, MOD A2C: 49.1 ml LV vol s, MOD A4C: 48.8 ml LV SV MOD A2C:     68.9 ml LV SV MOD A4C:     157.0 ml LV SV MOD BP:      93.0 ml RIGHT VENTRICLE             IVC RV Basal diam:  5.20 cm     IVC diam: 3.50 cm RV S prime:     16.90 cm/s TAPSE (M-mode): 2.4 cm LEFT ATRIUM             Index        RIGHT ATRIUM           Index LA diam:        3.80 cm  1.93 cm/m   RA Area:     31.30 cm LA Vol (A2C):   95.9 ml 48.82 ml/m  RA Volume:   131.00 ml 66.69 ml/m LA Vol (A4C):   88.1 ml 44.85 ml/m LA Biplane Vol: 91.6 ml 46.63 ml/m  AORTIC VALVE                     PULMONIC VALVE AV Area (Vmax):    4.01 cm      PV Vmax:          1.02 m/s AV Area (Vmean):   3.77 cm      PV Peak grad:     4.2 mmHg AV Area (VTI):     4.10 cm      PR End Diast Vel: 14.90 msec AV Vmax:           201.00 cm/s AV Vmean:          137.000 cm/s AV VTI:  0.361 m AV Peak Grad:      16.2 mmHg AV Mean Grad:      9.0 mmHg LVOT Vmax:         152.00 cm/s LVOT Vmean:        97.200 cm/s LVOT VTI:          0.279 m LVOT/AV VTI ratio: 0.77 AI PHT:            508 msec  AORTA Ao Root diam: 4.15 cm Ao Asc diam:  3.60 cm MITRAL VALVE                TRICUSPID VALVE MV Area (PHT): 3.48 cm     TR Peak grad:   86.1 mmHg MV Decel Time: 218 msec     TR Vmax:        464.00 cm/s MV E velocity: 120.00 cm/s MV A velocity: 126.00 cm/s  SHUNTS MV E/A ratio:  0.95         Systemic VTI:  0.28 m                             Systemic Diam: 2.60 cm Isaias Cowman MD Electronically signed by Isaias Cowman MD Signature Date/Time: 06/21/2022/10:12:58 AM    Final    CT CHEST WO CONTRAST  Result Date: 06/19/2022 CLINICAL DATA:  Respiratory illness with nondiagnostic x-ray. Congestion, cough, pulmonary edema. EXAM: CT CHEST WITHOUT CONTRAST TECHNIQUE: Multidetector CT imaging of the chest was performed following the standard protocol without IV contrast. RADIATION DOSE REDUCTION: This exam was performed according to the departmental dose-optimization program which includes automated exposure control, adjustment of the mA and/or kV according to patient size and/or use of iterative reconstruction technique. COMPARISON:  Chest radiograph 06/19/2022 CT chest 05/20/2022 FINDINGS: Cardiovascular: Diffuse cardiac enlargement with moderate pericardial effusion. Similar appearance to previous study. Normal caliber  thoracic aorta with scattered calcification. Calcification in the coronary arteries. Prominent central pulmonary arteries may indicate evidence of pulmonary arterial hypertension. Mediastinum/Nodes: Right central venous catheter with tip at the cavoatrial junction region. Evaluation of lymph nodes is limited without IV contrast material but there appear to be scattered prominent lymph nodes in the thoracic inlet, supraclavicular regions, and in the mediastinum. Largest nodes measure about 12 mm short axis dimension. Esophagus is decompressed. Thyroid gland is unremarkable. Lungs/Pleura: Motion artifact limits evaluation. Atelectasis in the lung bases. Diffuse interstitial pattern to the lungs is most likely to represent edema. A 1.6 cm diameter pulmonary nodule is demonstrated in the superior segment of the right lower lung, series 3, image 71. A left lower lung nodule is demonstrated on image 83, measuring 11 mm diameter. Nodules are unchanged since prior study. Upper Abdomen: Diffuse hepatic enlargement. No focal lesions are demonstrated. Kidneys are somewhat atrophic. Musculoskeletal: Degenerative changes in the spine. No destructive bone lesions. Old healing nondepressed midsternal fracture. IMPRESSION: 1. Diffuse cardiac enlargement with pericardial effusion similar to prior study. 2. Aortic atherosclerosis with coronary artery calcification. 3. Enlarged central pulmonary arteries may indicate pulmonary arterial hypertension. 4. Atelectasis in the lung bases similar prior study. 5. Prominent mediastinal lymph nodes are nonspecific but may be reactive. 6. Bilateral pulmonary nodules as detailed above. Largest measures 1.6 cm in diameter. Similar appearance to previous study. Consider one of the following in 3 months for both low-risk and high-risk individuals: (a) repeat chest CT, (b) follow-up PET-CT, or (c) tissue sampling. This recommendation follows the consensus statement: Guidelines for Management of  Incidental Pulmonary Nodules Detected on CT Images: From the Fleischner Society 2017; Radiology 2017; 284:228-243. Electronically Signed   By: Lucienne Capers M.D.   On: 06/19/2022 17:00   DG Chest Port 1 View  Result Date: 06/19/2022 CLINICAL DATA:  Questionable sepsis.  Shortness of breath. EXAM: PORTABLE CHEST 1 VIEW COMPARISON:  05/20/2022 FINDINGS: Right chest wall dialysis catheter noted with tips at the superior cavoatrial junction. Heart size is enlarged. Blunting of the costophrenic angles. Moderate diffuse increase interstitial markings are identified bilaterally. Platelike atelectasis noted in the left base. The visualized osseous structures are unremarkable. IMPRESSION: 1. Pulmonary edema and small pleural effusions. 2. Left base atelectasis. 3. Right chest wall dialysis catheter tip at the superior cavoatrial junction. Electronically Signed   By: Kerby Moors M.D.   On: 06/19/2022 10:43   CT HEAD WO CONTRAST  Result Date: 06/19/2022 CLINICAL DATA:  Altered mental status. EXAM: CT HEAD WITHOUT CONTRAST TECHNIQUE: Contiguous axial images were obtained from the base of the skull through the vertex without intravenous contrast. RADIATION DOSE REDUCTION: This exam was performed according to the departmental dose-optimization program which includes automated exposure control, adjustment of the mA and/or kV according to patient size and/or use of iterative reconstruction technique. COMPARISON:  05/20/2022 FINDINGS: Brain: No evidence of acute infarction, hemorrhage, hydrocephalus, extra-axial collection or mass lesion/mass effect. There is mild diffuse low-attenuation within the subcortical and periventricular white matter compatible with chronic microvascular disease. Vascular: No hyperdense vessel or unexpected calcification. Skull: Normal. Negative for fracture or focal lesion. Sinuses/Orbits: Similar appearance of left posterior dislocated lens. The paranasal sinuses are clear. Bilateral mastoid  air cell effusions. Other: None IMPRESSION: 1. No acute intracranial abnormalities. 2. Mild white matter, small vessel ischemic change. 3. Bilateral mastoid air cell effusions. 4. 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 Electronically Signed   By: Kerby Moors M.D.   On: 06/19/2022 10:35    Microbiology: Results for orders placed or performed during the hospital encounter of 06/19/22  Resp panel by RT-PCR (RSV, Flu A&B, Covid) Anterior Nasal Swab     Status: None   Collection Time: 06/19/22 10:31 AM   Specimen: Anterior Nasal Swab  Result Value Ref Range Status   SARS Coronavirus 2 by RT PCR NEGATIVE NEGATIVE Final    Comment: (NOTE) SARS-CoV-2 target nucleic acids are NOT DETECTED.  The SARS-CoV-2 RNA is generally detectable in upper respiratory specimens during the acute phase of infection. The lowest concentration of SARS-CoV-2 viral copies this assay can detect is 138 copies/mL. A negative result does not preclude SARS-Cov-2 infection and should not be used as the sole basis for treatment or other patient management decisions. A negative result may occur with  improper specimen collection/handling, submission of specimen other than nasopharyngeal swab, presence of viral mutation(s) within the areas targeted by this assay, and inadequate number of viral copies(<138 copies/mL). A negative result must be combined with clinical observations, patient history, and epidemiological information. The expected result is Negative.  Fact Sheet for Patients:  EntrepreneurPulse.com.au  Fact Sheet for Healthcare Providers:  IncredibleEmployment.be  This test is no t yet approved or cleared by the [place] FDA and  has been authorized for detection and/or diagnosis of SARS-CoV-2 by FDA under an Emergency Use Authorization (EUA). This EUA will remain  in effect (meaning this test can be used) for the duration of the COVID-19  declaration under Section 564(b)(1) of the Act, 21 U.S.C.section 360bbb-3(b)(1), unless the authorization is terminated  or revoked sooner.  Influenza A by PCR NEGATIVE NEGATIVE Final   Influenza B by PCR NEGATIVE NEGATIVE Final    Comment: (NOTE) The Xpert Xpress SARS-CoV-2/FLU/RSV plus assay is intended as an aid in the diagnosis of influenza from Nasopharyngeal swab specimens and should not be used as a sole basis for treatment. Nasal washings and aspirates are unacceptable for Xpert Xpress SARS-CoV-2/FLU/RSV testing.  Fact Sheet for Patients: EntrepreneurPulse.com.au  Fact Sheet for Healthcare Providers: IncredibleEmployment.be  This test is not yet approved or cleared by the [place] FDA and has been authorized for detection and/or diagnosis of SARS-CoV-2 by FDA under an Emergency Use Authorization (EUA). This EUA will remain in effect (meaning this test can be used) for the duration of the COVID-19 declaration under Section 564(b)(1) of the Act, 21 U.S.C. section 360bbb-3(b)(1), unless the authorization is terminated or revoked.     Resp Syncytial Virus by PCR NEGATIVE NEGATIVE Final    Comment: (NOTE) Fact Sheet for Patients: EntrepreneurPulse.com.au  Fact Sheet for Healthcare Providers: IncredibleEmployment.be  This test is not yet approved or cleared by the [place] FDA and has been authorized for detection and/or diagnosis of SARS-CoV-2 by FDA under an Emergency Use Authorization (EUA). This EUA will remain in effect (meaning this test can be used) for the duration of the COVID-19 declaration under Section 564(b)(1) of the Act, 21 U.S.C. section 360bbb-3(b)(1), unless the authorization is terminated or revoked.  Performed at [facility], [address] [place], [place] [address]   Blood Culture (routine x 2)     Status: Abnormal   Collection Time: 06/19/22 10:39  AM   Specimen: BLOOD  Result Value Ref Range Status   Specimen Description   Final    BLOOD RIGHT [facility] Performed at [facility], [address] [place], [place] [address]    Special Requests   Final    BOTTLES DRAWN AEROBIC AND ANAEROBIC Blood Culture adequate volume Performed at [facility], [address] [place], [place] [address]    Culture  Setup Time   Final    Organism ID to follow IN BOTH AEROBIC AND ANAEROBIC BOTTLES GRAM POSITIVE COCCI CRITICAL RESULT CALLED TO, READ BACK BY AND VERIFIED WITH: JASON ROBBINS 06/19/2022 AT 2147 SRR    Culture STREPTOCOCCUS GALLOLYTICUS (A)  Final   Report Status 06/22/2022 FINAL  Final   Organism ID, Bacteria STREPTOCOCCUS GALLOLYTICUS  Final      Susceptibility   Streptococcus gallolyticus - MIC*    PENICILLIN 0.12 SENSITIVE Sensitive     CEFTRIAXONE 0.25 SENSITIVE Sensitive     ERYTHROMYCIN >=8 RESISTANT Resistant     LEVOFLOXACIN 2 SENSITIVE Sensitive     VANCOMYCIN 0.5 SENSITIVE Sensitive     * STREPTOCOCCUS GALLOLYTICUS  Blood Culture ID Panel (Reflexed)     Status: Abnormal   Collection Time: 06/19/22 10:39 AM  Result Value Ref Range Status   Enterococcus faecalis NOT DETECTED NOT DETECTED Final   Enterococcus Faecium NOT DETECTED NOT DETECTED Final   Listeria monocytogenes NOT DETECTED NOT DETECTED Final   Staphylococcus species NOT DETECTED NOT DETECTED Final   Staphylococcus aureus (BCID) NOT DETECTED NOT DETECTED Final   Staphylococcus epidermidis NOT DETECTED NOT DETECTED Final   Staphylococcus lugdunensis NOT DETECTED NOT DETECTED Final   Streptococcus species DETECTED (A) NOT DETECTED Final    Comment: Not Enterococcus species, Streptococcus agalactiae, Streptococcus pyogenes, or Streptococcus pneumoniae. CRITICAL RESULT CALLED TO, READ BACK BY AND VERIFIED WITH: JASON ROBBINS 06/19/2022 AT 2147 SRR    Streptococcus  agalactiae NOT DETECTED NOT DETECTED Final   Streptococcus pneumoniae NOT DETECTED  NOT DETECTED Final   Streptococcus pyogenes NOT DETECTED NOT DETECTED Final   A.calcoaceticus-baumannii NOT DETECTED NOT DETECTED Final   Bacteroides fragilis NOT DETECTED NOT DETECTED Final   Enterobacterales NOT DETECTED NOT DETECTED Final   Enterobacter cloacae complex NOT DETECTED NOT DETECTED Final   Escherichia coli NOT DETECTED NOT DETECTED Final   Klebsiella aerogenes NOT DETECTED NOT DETECTED Final   Klebsiella oxytoca NOT DETECTED NOT DETECTED Final   Klebsiella pneumoniae NOT DETECTED NOT DETECTED Final   Proteus species NOT DETECTED NOT DETECTED Final   Salmonella species NOT DETECTED NOT DETECTED Final   Serratia marcescens NOT DETECTED NOT DETECTED Final   Haemophilus influenzae NOT DETECTED NOT DETECTED Final   Neisseria meningitidis NOT DETECTED NOT DETECTED Final   Pseudomonas aeruginosa NOT DETECTED NOT DETECTED Final   Stenotrophomonas maltophilia NOT DETECTED NOT DETECTED Final   Candida albicans NOT DETECTED NOT DETECTED Final   Candida auris NOT DETECTED NOT DETECTED Final   Candida glabrata NOT DETECTED NOT DETECTED Final   Candida krusei NOT DETECTED NOT DETECTED Final   Candida parapsilosis NOT DETECTED NOT DETECTED Final   Candida tropicalis NOT DETECTED NOT DETECTED Final   Cryptococcus neoformans/gattii NOT DETECTED NOT DETECTED Final    Comment: Performed at [facility], [address] [place], [place] [address]  Blood Culture (routine x 2)     Status: Abnormal   Collection Time: 06/19/22 10:49 AM   Specimen: BLOOD  Result Value Ref Range Status   Specimen Description   Final    BLOOD RIGHT [facility] Performed at [facility], [address], [place], [place] [address]    Special Requests   Final    BOTTLES DRAWN AEROBIC AND ANAEROBIC Blood Culture adequate volume Performed at [facility], [address] [place], [place] [address]    Culture  Setup Time   Final    IN BOTH AEROBIC AND ANAEROBIC BOTTLES GRAM POSITIVE  [facility] CRITICAL RESULT CALLED TO, READ BACK BY AND VERIFIED WITH: JASON ROBBINS 06/19/2022 AT 2147 SRR    Culture (A)  Final    STREPTOCOCCUS GALLOLYTICUS SUSCEPTIBILITIES PERFORMED ON PREVIOUS CULTURE WITHIN THE LAST 5 DAYS. Performed at [facility], [address], [place], [place] [address]    Report Status 06/22/2022 FINAL  Final  Culture, blood (Routine X 2) w Reflex to ID Panel     Status: None   Collection Time: 06/19/22  7:40 PM   Specimen: BLOOD  Result Value Ref Range Status   Specimen Description BLOOD BLOOD RIGHT HAND  Final   Special Requests   Final    BOTTLES DRAWN AEROBIC ONLY Blood Culture results may not be optimal due to an inadequate volume of blood received in culture bottles   Culture   Final    NO GROWTH 5 DAYS Performed at [facility], [address] [place], [place] [address]    Report Status 06/24/2022 FINAL  Final  Culture, blood (Routine X 2) w Reflex to ID Panel     Status: None   Collection Time: 06/19/22  7:40 PM   Specimen: BLOOD  Result Value Ref Range Status   Specimen Description BLOOD THUMB RIGHT  Final   Special Requests IN PEDIATRIC BOTTLE Blood Culture adequate volume  Final   Culture   Final    NO GROWTH 5 DAYS Performed at [facility], [address], [place], [place] [address]  Report Status 06/24/2022 FINAL  Final  Cath Tip Culture     Status: Abnormal   Collection Time: 06/20/22 11:24 AM   Specimen: Catheter Tip; Other  Result Value Ref Range Status   Specimen Description   Final    CATH TIP Performed at [facility], [address] [place], [place] [address]    Special Requests   Final    NONE Performed at [facility] [address] [place], [place] [address]    Culture STREPTOCOCCUS GALLOLYTICUS (A)  Final   Report Status 06/24/2022 FINAL  Final   Organism ID, Bacteria STREPTOCOCCUS GALLOLYTICUS  Final      Susceptibility   Streptococcus gallolyticus - MIC*     PENICILLIN 0.12 SENSITIVE Sensitive     CEFTRIAXONE <=0.12 SENSITIVE Sensitive     ERYTHROMYCIN >=8 RESISTANT Resistant     LEVOFLOXACIN 2 SENSITIVE Sensitive     VANCOMYCIN 0.5 SENSITIVE Sensitive     * STREPTOCOCCUS GALLOLYTICUS  Culture, blood (Routine X 2) w Reflex to ID Panel     Status: None   Collection Time: 06/23/22  1:34 PM   Specimen: BLOOD  Result Value Ref Range Status   Specimen Description BLOOD BLOOD RIGHT FOREARM  Final   Special Requests   Final    BOTTLES DRAWN AEROBIC AND ANAEROBIC Blood Culture results may not be optimal due to an excessive volume of blood received in culture bottles   Culture   Final    NO GROWTH 5 DAYS Performed at [facility], [address] [place], [place] [address]    Report Status 06/28/2022 FINAL  Final  Culture, blood (Routine X 2) w Reflex to ID Panel     Status: None   Collection Time: 06/23/22  1:39 PM   Specimen: BLOOD  Result Value Ref Range Status   Specimen Description BLOOD BLOOD RIGHT HAND  Final   Special Requests   Final    BOTTLES DRAWN AEROBIC AND ANAEROBIC Blood Culture adequate volume   Culture   Final    NO GROWTH 5 DAYS Performed at [facility], [address] [place], [place] [address]    Report Status 06/28/2022 FINAL  Final    Labs: CBC: Recent Labs  Lab 06/26/22 0529 06/27/22 0406 06/28/22 0613 06/29/22 0255 06/30/22 0258  WBC 5.1 6.4 5.4 7.1 5.9  HGB 8.2* 9.5* 8.5* 8.9* 8.9*  HCT 25.0* 28.5* 25.5* 26.4* 26.8*  MCV 102.0* 101.4* 101.6* 101.1* 102.3*  PLT 158 189 190 209 086   Basic Metabolic Panel: Recent Labs  Lab 06/26/22 0529 06/27/22 0406 06/28/22 0613 06/29/22 0255 06/30/22 0258  NA 134* 134* 131* 133* 130*  K 5.3* 4.8 5.4* 5.8* 4.3  CL 96* 95* 97* 93* 93*  CO2 29 28 29 27 30   GLUCOSE 78 82 74 84 132*  BUN 48* 34* 45* 57* 39*  CREATININE 6.15* 4.70* 6.09* 7.25* 5.48*  CALCIUM 8.9 9.0 8.9 9.1 8.7*  MG 1.8 1.9 2.0 2.0 1.8   Liver Function Tests: Recent Labs   Lab 06/26/22 0529 06/27/22 0406 06/28/22 0613 06/29/22 0255 06/30/22 0258  AST 22 26 24 20 21   ALT 7 6 <5 <5 <5  ALKPHOS 66 89 78 82 86  BILITOT 0.7 0.6 0.7 0.6 0.7  PROT 5.7* 6.3* 5.9* 6.1* 5.8*  ALBUMIN 2.9* 3.0* 3.0* 3.1* 2.8*   CBG: Recent Labs  Lab 06/24/22 1155  GLUCAP 123*    Discharge time spent: greater than 30 minutes.  Signed:  Lucienne Minks , MD [facility] 06/30/2022

## 2022-07-08 NOTE — Progress Notes (Signed)
Demand ischemia due to MI type 2 is the correct diagnosis for the coding query

## 2022-08-12 ENCOUNTER — Other Ambulatory Visit (INDEPENDENT_AMBULATORY_CARE_PROVIDER_SITE_OTHER): Payer: Self-pay | Admitting: Nurse Practitioner

## 2022-08-12 DIAGNOSIS — N186 End stage renal disease: Secondary | ICD-10-CM

## 2022-08-13 ENCOUNTER — Encounter (INDEPENDENT_AMBULATORY_CARE_PROVIDER_SITE_OTHER): Payer: Self-pay | Admitting: Nurse Practitioner

## 2022-08-13 ENCOUNTER — Ambulatory Visit (INDEPENDENT_AMBULATORY_CARE_PROVIDER_SITE_OTHER): Payer: Medicaid Other

## 2022-08-13 ENCOUNTER — Ambulatory Visit (INDEPENDENT_AMBULATORY_CARE_PROVIDER_SITE_OTHER): Payer: Medicaid Other | Admitting: Nurse Practitioner

## 2022-08-13 VITALS — BP 132/71 | HR 83 | Resp 16

## 2022-08-13 DIAGNOSIS — J449 Chronic obstructive pulmonary disease, unspecified: Secondary | ICD-10-CM | POA: Diagnosis not present

## 2022-08-13 DIAGNOSIS — Z992 Dependence on renal dialysis: Secondary | ICD-10-CM | POA: Diagnosis not present

## 2022-08-13 DIAGNOSIS — I10 Essential (primary) hypertension: Secondary | ICD-10-CM

## 2022-08-13 DIAGNOSIS — N186 End stage renal disease: Secondary | ICD-10-CM

## 2022-08-13 NOTE — Progress Notes (Addendum)
Subjective:    Patient ID: Todd Brown, male    DOB: [birth year], 62 y.o.   MRN: [id number] Chief Complaint  Patient presents with   Follow-up    Ultrasound follow up    The patient returns to the office for follow up regarding a problem with their dialysis access.   The patient is unable to provide any history.  He has an attendant with him but she also provides very little history.  There is no redness or swelling at the access site.  Thrill is pulsatile  No recent shortening of the patient's walking distance or new symptoms consistent with claudication.  No history of rest pain symptoms. No new ulcers or wounds of the lower extremities have occurred.  The patient denies amaurosis fugax or recent TIA symptoms. There are no recent neurological changes noted. There is no history of DVT, PE or superficial thrombophlebitis. No recent episodes of angina or shortness of breath documented.   Duplex ultrasound of the AV access shows a patent access.  The previously noted stenosis is significantly increased compared to last study.  Flow volume today is 653 cc/min (previous flow volume was 1643 cc/min)    Review of Systems  Psychiatric/Behavioral:  Positive for agitation.   All other systems reviewed and are negative.      Objective:   Physical Exam Vitals reviewed.  HENT:     Head: Normocephalic.  Cardiovascular:     Rate and Rhythm: Normal rate.     Pulses:          Radial pulses are 1+ on the right side and 1+ on the left side.     Arteriovenous access: Left arteriovenous access is present.    Comments: Good thrill and bruit Pulmonary:     Effort: Pulmonary effort is normal.  Skin:    General: Skin is warm and dry.  Neurological:     Mental Status: He is alert and oriented to person, place, and time.  Psychiatric:        Mood and Affect: Mood normal.        Behavior: Behavior normal.        Thought Content: Thought content normal.        Judgment: Judgment normal.      BP 132/71 (BP Location: Right Arm)   Pulse 83   Resp 16   Past Medical History:  Diagnosis Date   Anemia    Arthritis    Chronic respiratory failure (HCC)    COPD (chronic obstructive pulmonary disease) ([place])    Dialysis patient ([place])    Mon. 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   ESRD (end stage renal disease) ([place])    ESRD (end stage renal disease) on dialysis ([place])    Hyperlipidemia    Hypertension    Malignant hypertension    Pneumonia 2016   Renal artery stenosis (HCC)    Schizophrenia (HCC)     Social History   Socioeconomic History   Marital status: Single    Spouse name: Not on file   Number of children: 0   Years of education: Not on file   Highest education level: Not on file  Occupational History   Occupation: Disability   Tobacco Use   Smoking status: Never    Passive exposure: Never   Smokeless tobacco: Never  Vaping Use   Vaping Use: Never used  Substance and Sexual Activity   Alcohol  use: Not Currently   Drug use: Not Currently    Types: Marijuana    Comment: not since living at assisted living place 03/2020   Sexual activity: Not Currently  Other Topics Concern   Not on file  Social History Narrative   ** Merged History Encounter **       Living at [place] assisted [facility] [place], [place] is legal guardian    Social Determinants of Health   Financial Resource Strain: Low Risk  (10/08/2018)   Overall Financial Resource Strain (CARDIA)    Difficulty of Paying Living Expenses: Not hard at all  Food Insecurity: No Food Insecurity (10/08/2018)   Hunger Vital Sign    Worried About Running Out of Food in the Last Year: Never true    [place] in the Last Year: Never true  Transportation Needs: No Transportation Needs (10/08/2018)   PRAPARE - Hydrologist (Medical): No    Lack of Transportation (Non-Medical): No  Physical  Activity: Unknown (10/08/2018)   Exercise Vital Sign    Days of Exercise per Week: Patient declined    Minutes of Exercise per Session: Patient declined  Stress: No Stress Concern Present (10/08/2018)   [place]    Feeling of Stress : Not at all  Social Connections: Unknown (10/08/2018)   Social Connection and Isolation Panel [NHANES]    Frequency of Communication with Friends and Family: Patient declined    Frequency of Social Gatherings with Friends and Family: Patient declined    Attends Religious Services: Patient declined    Active Member of Clubs or Organizations: Patient declined    Attends Archivist Meetings: Patient declined    Marital Status: Patient declined  Intimate Partner Violence: Unknown (10/08/2018)   Humiliation, Afraid, Rape, and Kick questionnaire    Fear of Current or Ex-Partner: Patient declined    Emotionally Abused: Patient declined    Physically Abused: Patient declined    Sexually Abused: Patient declined    Past Surgical History:  Procedure Laterality Date   A/V FISTULAGRAM N/A 08/06/2016   Procedure: A/V Fistulagram;  Surgeon: Algernon Huxley, MD;  Location: [place] [facility];  Service: Cardiovascular;  Laterality: N/A;   A/V FISTULAGRAM N/A 02/14/2021   Procedure: A/V FISTULAGRAM poss Perm Cath Insertion;  Surgeon: Katha Cabal, MD;  Location: [place] [facility];  Service: Cardiovascular;  Laterality: N/A;   A/V FISTULAGRAM Left 08/26/2021   Procedure: A/V Fistulagram;  Surgeon: Algernon Huxley, MD;  Location: [place] [facility];  Service: Cardiovascular;  Laterality: Left;   A/V SHUNT INTERVENTION N/A 08/06/2016   Procedure: A/V Shunt Intervention;  Surgeon: Algernon Huxley, MD;  Location: [place] [facility];  Service: Cardiovascular;  Laterality: N/A;   AV FISTULA PLACEMENT Left 12/26/2014   Procedure: ARTERIOVENOUS (AV) FISTULA CREATION;  Surgeon: Algernon Huxley, MD;  Location:  [facility];  Service: Vascular;  Laterality: Left;   AV FISTULA PLACEMENT     ESOPHAGOGASTRODUODENOSCOPY (EGD) WITH PROPOFOL N/A 05/06/2017   Procedure: ESOPHAGOGASTRODUODENOSCOPY (EGD) WITH PROPOFOL;  Surgeon: Jonathon Bellows, MD;  Location: [facility];  Service: Gastroenterology;  Laterality: N/A;   INSERTION OF DIALYSIS CATHETER Right    PERIPHERAL VASCULAR CATHETERIZATION N/A 10/22/2014   Procedure: Dialysis/Perma Catheter Insertion;  Surgeon: Algernon Huxley, MD;  Location: [place] [facility];  Service: Cardiovascular;  Laterality: N/A;   PERIPHERAL VASCULAR CATHETERIZATION N/A 02/28/2015  Procedure: Dialysis/Perma Catheter Removal;  Surgeon: Algernon Huxley, MD;  Location: [place] [facility];  Service: Cardiovascular;  Laterality: N/A;   Repair fx left lower leg     yrs ago (age 72)   REVISON OF ARTERIOVENOUS FISTULA Left 03/27/2022   Procedure: REVISON OF ARTERIOVENOUS FISTULA (JUMP GRAFT);  Surgeon: Katha Cabal, MD;  Location: [facility];  Service: Vascular;  Laterality: Left;   TONSILLECTOMY      Family History  Problem Relation Age of Onset   Kidney disease Brother    Diabetes Neg Hx     Allergies  Allergen Reactions   Chlorpromazine Other (See Comments)    Reaction:  Unknown , pt states it makes him feel real bad Reaction:  Unknown , pt states it makes him feel real bad        Latest Ref Rng & Units 06/30/2022    2:58 AM 06/29/2022    2:55 AM 06/28/2022    6:13 AM  CBC  WBC 4.0 - 10.5 K/uL 5.9  7.1  5.4   Hemoglobin 13.0 - 17.0 g/dL 8.9  8.9  8.5   Hematocrit 39.0 - 52.0 % 26.8  26.4  25.5   Platelets 150 - 400 K/uL 228  209  190       CMP     Component Value Date/Time   NA 130 (L) 06/30/2022 0258   K 4.3 06/30/2022 0258   CL 93 (L) 06/30/2022 0258   CO2 30 06/30/2022 0258   GLUCOSE 132 (H) 06/30/2022 0258   BUN 39 (H) 06/30/2022 0258   CREATININE 5.48 (H) 06/30/2022 0258   CREATININE 3.47 (H) 03/23/2013 1501   CALCIUM 8.7 (L) 06/30/2022 0258   CALCIUM  8.1 (L) 10/22/2014 0504   PROT 5.8 (L) 06/30/2022 0258   ALBUMIN 2.8 (L) 06/30/2022 0258   AST 21 06/30/2022 0258   ALT <5 06/30/2022 0258   ALKPHOS 86 06/30/2022 0258   BILITOT 0.7 06/30/2022 0258   GFRNONAA 11 (L) 06/30/2022 0258   GFRAA 7 (L) 10/23/2019 0405     No results found.     Assessment & Plan:   1. ESRD on dialysis [facility]) Recommend:  The patient is experiencing increasing problems with their dialysis access.  Patient should have a fistulagram with the intention for intervention.  The intention for intervention is to restore appropriate flow and prevent thrombosis and possible loss of the access.  As well as improve the quality of dialysis therapy.  The risks, benefits and alternative therapies were reviewed in detail with the patient.  All questions were answered.  The patient agrees to proceed with angio/intervention.    The patient will follow up with me in the office after the procedure.  - VAS US DUPLEX DIALYSIS ACCESS (AVF, AVG)  2. Essential hypertension, benign Continue antihypertensive medications as already ordered, these medications have been reviewed and there are no changes at this time.  3. Chronic obstructive pulmonary disease, unspecified COPD type ([place]) Continue pulmonary medications and aerosols as already ordered, these medications have been reviewed and there are no changes at this time.    Current Outpatient Medications on File Prior to Visit  Medication Sig Dispense Refill   albuterol (VENTOLIN HFA) 108 (90 Base) MCG/ACT inhaler Inhale 1-2 puffs into the lungs every 4 (four) hours as needed for wheezing or shortness of breath.     amLODipine (NORVASC) 10 MG tablet Take 10 mg by mouth every morning.     b complex-vitamin c-folic acid (  NEPHRO-VITE) 0.8 MG TABS tablet Take 1 tablet by mouth at bedtime.     calcium acetate (PHOSLO) 667 MG capsule Take 1,334 mg by mouth 3 (three) times daily with meals.     carbamazepine (TEGRETOL) 200 MG tablet  Take 1,000 mg by mouth every morning.     cholecalciferol (VITAMIN D) 1000 UNITS tablet Take 1,000 Units by mouth in the morning.     cloNIDine (CATAPRES - DOSED IN MG/24 HR) 0.2 mg/24hr patch Place 1 patch (0.2 mg total) onto the skin once a week. 4 patch 0   doxazosin (CARDURA) 4 MG tablet Take 1 tablet (4 mg total) by mouth at bedtime. 30 tablet 0   ferrous sulfate 325 (65 FE) MG tablet Take 1 tablet (325 mg total) by mouth daily. 30 tablet 3   fluPHENAZine (PROLIXIN) 2.5 MG tablet Take 2.5 mg by mouth in the morning.     Fluticasone-Salmeterol (ADVAIR) 250-50 MCG/DOSE AEPB Inhale 1 puff into the lungs 2 (two) times daily.     hydrALAZINE (APRESOLINE) 25 MG tablet Take 50 mg by mouth 3 (three) times daily.     isosorbide mononitrate (IMDUR) 60 MG 24 hr tablet Take 60 mg by mouth 2 (two) times daily.     minoxidil (LONITEN) 2.5 MG tablet Take 1 tablet (2.5 mg total) by mouth daily. 30 tablet 0   omeprazole (PRILOSEC) 40 MG capsule Take 40 mg by mouth every morning.     polyethylene glycol (MIRALAX / GLYCOLAX) 17 g packet Take 17 g by mouth daily as needed for mild constipation.     promethazine-dextromethorphan (PROMETHAZINE-DM) 6.25-15 MG/5ML syrup Take 5 mLs by mouth every 6 (six) hours as needed for cough.     trihexyphenidyl (ARTANE) 2 MG tablet Take 2 mg by mouth 2 (two) times daily.     levothyroxine (SYNTHROID) 50 MCG tablet Take 1 tablet (50 mcg total) by mouth daily at 6 (six) AM. 30 tablet 0   No current facility-administered medications on file prior to visit.    There are no Patient Instructions on file for this visit. No follow-ups on file.   Kris Hartmann, NP

## 2022-09-04 ENCOUNTER — Telehealth (INDEPENDENT_AMBULATORY_CARE_PROVIDER_SITE_OTHER): Payer: Self-pay

## 2022-09-04 NOTE — Telephone Encounter (Signed)
Spoke with the patient's caregiver Achilles Dunk and the patient is scheduled with Dr. Gilda Crease on 09/15/22 with a  11:00 am arrival time to the [facility]. Pre-procedure instructions were discussed and it was made aware that the patients legal guardian has to be there to sign paperwork as well. Mr. Doristine Mango stated he understood and would have them there to do so. This will be mailed.

## 2022-09-14 ENCOUNTER — Emergency Department: Payer: Medicaid Other

## 2022-09-14 ENCOUNTER — Inpatient Hospital Stay
Admission: EM | Admit: 2022-09-14 | Discharge: 2022-09-24 | DRG: 871 | Disposition: A | Discharge status: Nursing Home (Includes ICF) | Payer: Medicaid Other | Source: Skilled Nursing Facility | Attending: Obstetrics and Gynecology | Admitting: Obstetrics and Gynecology

## 2022-09-14 DIAGNOSIS — J440 Chronic obstructive pulmonary disease with acute lower respiratory infection: Secondary | ICD-10-CM | POA: Diagnosis present

## 2022-09-14 DIAGNOSIS — I120 Hypertensive chronic kidney disease with stage 5 chronic kidney disease or end stage renal disease: Secondary | ICD-10-CM | POA: Diagnosis not present

## 2022-09-14 DIAGNOSIS — H543 Unqualified visual loss, both eyes: Secondary | ICD-10-CM | POA: Diagnosis present

## 2022-09-14 DIAGNOSIS — I701 Atherosclerosis of renal artery: Secondary | ICD-10-CM | POA: Diagnosis present

## 2022-09-14 DIAGNOSIS — N2581 Secondary hyperparathyroidism of renal origin: Secondary | ICD-10-CM | POA: Diagnosis present

## 2022-09-14 DIAGNOSIS — Z888 Allergy status to other drugs, medicaments and biological substances status: Secondary | ICD-10-CM

## 2022-09-14 DIAGNOSIS — I10 Essential (primary) hypertension: Secondary | ICD-10-CM | POA: Diagnosis present

## 2022-09-14 DIAGNOSIS — G9341 Metabolic encephalopathy: Secondary | ICD-10-CM | POA: Diagnosis not present

## 2022-09-14 DIAGNOSIS — R54 Age-related physical debility: Secondary | ICD-10-CM | POA: Diagnosis present

## 2022-09-14 DIAGNOSIS — J449 Chronic obstructive pulmonary disease, unspecified: Secondary | ICD-10-CM | POA: Diagnosis present

## 2022-09-14 DIAGNOSIS — A419 Sepsis, unspecified organism: Principal | ICD-10-CM

## 2022-09-14 DIAGNOSIS — H5462 Unqualified visual loss, left eye, normal vision right eye: Secondary | ICD-10-CM | POA: Diagnosis not present

## 2022-09-14 DIAGNOSIS — J9621 Acute and chronic respiratory failure with hypoxia: Secondary | ICD-10-CM | POA: Diagnosis present

## 2022-09-14 DIAGNOSIS — R6520 Severe sepsis without septic shock: Secondary | ICD-10-CM | POA: Diagnosis present

## 2022-09-14 DIAGNOSIS — Z992 Dependence on renal dialysis: Secondary | ICD-10-CM

## 2022-09-14 DIAGNOSIS — D631 Anemia in chronic kidney disease: Secondary | ICD-10-CM | POA: Diagnosis present

## 2022-09-14 DIAGNOSIS — I5033 Acute on chronic diastolic (congestive) heart failure: Secondary | ICD-10-CM | POA: Diagnosis present

## 2022-09-14 DIAGNOSIS — R188 Other ascites: Secondary | ICD-10-CM | POA: Diagnosis present

## 2022-09-14 DIAGNOSIS — Z79890 Hormone replacement therapy (postmenopausal): Secondary | ICD-10-CM

## 2022-09-14 DIAGNOSIS — E039 Hypothyroidism, unspecified: Secondary | ICD-10-CM | POA: Diagnosis present

## 2022-09-14 DIAGNOSIS — I340 Nonrheumatic mitral (valve) insufficiency: Secondary | ICD-10-CM | POA: Diagnosis not present

## 2022-09-14 DIAGNOSIS — N186 End stage renal disease: Secondary | ICD-10-CM | POA: Diagnosis present

## 2022-09-14 DIAGNOSIS — Z9981 Dependence on supplemental oxygen: Secondary | ICD-10-CM

## 2022-09-14 DIAGNOSIS — Z8701 Personal history of pneumonia (recurrent): Secondary | ICD-10-CM

## 2022-09-14 DIAGNOSIS — R7881 Bacteremia: Secondary | ICD-10-CM | POA: Diagnosis not present

## 2022-09-14 DIAGNOSIS — Z841 Family history of disorders of kidney and ureter: Secondary | ICD-10-CM

## 2022-09-14 DIAGNOSIS — E785 Hyperlipidemia, unspecified: Secondary | ICD-10-CM | POA: Diagnosis present

## 2022-09-14 DIAGNOSIS — Z7951 Long term (current) use of inhaled steroids: Secondary | ICD-10-CM | POA: Diagnosis not present

## 2022-09-14 DIAGNOSIS — Z1152 Encounter for screening for COVID-19: Secondary | ICD-10-CM | POA: Diagnosis not present

## 2022-09-14 DIAGNOSIS — F209 Schizophrenia, unspecified: Secondary | ICD-10-CM | POA: Diagnosis present

## 2022-09-14 DIAGNOSIS — J189 Pneumonia, unspecified organism: Secondary | ICD-10-CM

## 2022-09-14 DIAGNOSIS — A408 Other streptococcal sepsis: Principal | ICD-10-CM | POA: Diagnosis present

## 2022-09-14 DIAGNOSIS — J9620 Acute and chronic respiratory failure, unspecified whether with hypoxia or hypercapnia: Secondary | ICD-10-CM | POA: Diagnosis present

## 2022-09-14 DIAGNOSIS — I3139 Other pericardial effusion (noninflammatory): Secondary | ICD-10-CM | POA: Diagnosis present

## 2022-09-14 DIAGNOSIS — F203 Undifferentiated schizophrenia: Secondary | ICD-10-CM | POA: Diagnosis not present

## 2022-09-14 DIAGNOSIS — Z86718 Personal history of other venous thrombosis and embolism: Secondary | ICD-10-CM

## 2022-09-14 DIAGNOSIS — H5461 Unqualified visual loss, right eye, normal vision left eye: Secondary | ICD-10-CM | POA: Diagnosis not present

## 2022-09-14 DIAGNOSIS — R5383 Other fatigue: Secondary | ICD-10-CM | POA: Diagnosis present

## 2022-09-14 DIAGNOSIS — B955 Unspecified streptococcus as the cause of diseases classified elsewhere: Secondary | ICD-10-CM | POA: Diagnosis not present

## 2022-09-14 DIAGNOSIS — R601 Generalized edema: Secondary | ICD-10-CM

## 2022-09-14 DIAGNOSIS — I351 Nonrheumatic aortic (valve) insufficiency: Secondary | ICD-10-CM | POA: Diagnosis not present

## 2022-09-14 DIAGNOSIS — I361 Nonrheumatic tricuspid (valve) insufficiency: Secondary | ICD-10-CM | POA: Diagnosis not present

## 2022-09-14 DIAGNOSIS — I132 Hypertensive heart and chronic kidney disease with heart failure and with stage 5 chronic kidney disease, or end stage renal disease: Secondary | ICD-10-CM | POA: Diagnosis present

## 2022-09-14 DIAGNOSIS — B954 Other streptococcus as the cause of diseases classified elsewhere: Secondary | ICD-10-CM | POA: Diagnosis not present

## 2022-09-14 LAB — CBC WITH DIFFERENTIAL/PLATELET
Abs Immature Granulocytes: 0.05 10*3/uL (ref 0.00–0.07)
Basophils Absolute: 0 10*3/uL (ref 0.0–0.1)
Basophils Relative: 0 %
Eosinophils Absolute: 0 10*3/uL (ref 0.0–0.5)
Eosinophils Relative: 0 %
HCT: 40 % (ref 39.0–52.0)
Hemoglobin: 13.2 g/dL (ref 13.0–17.0)
Immature Granulocytes: 0 %
Lymphocytes Relative: 2 %
Lymphs Abs: 0.2 10*3/uL — ABNORMAL LOW (ref 0.7–4.0)
MCH: 33.6 pg (ref 26.0–34.0)
MCHC: 33 g/dL (ref 30.0–36.0)
MCV: 101.8 fL — ABNORMAL HIGH (ref 80.0–100.0)
Monocytes Absolute: 0.9 10*3/uL (ref 0.1–1.0)
Monocytes Relative: 7 %
Neutro Abs: 10.9 10*3/uL — ABNORMAL HIGH (ref 1.7–7.7)
Neutrophils Relative %: 91 %
Platelets: 183 10*3/uL (ref 150–400)
RBC: 3.93 MIL/uL — ABNORMAL LOW (ref 4.22–5.81)
RDW: 14.4 % (ref 11.5–15.5)
WBC: 12.1 10*3/uL — ABNORMAL HIGH (ref 4.0–10.5)
nRBC: 0 % (ref 0.0–0.2)

## 2022-09-14 LAB — BLOOD GAS, VENOUS
Acid-Base Excess: 7.4 mmol/L — ABNORMAL HIGH (ref 0.0–2.0)
Bicarbonate: 31.2 mmol/L — ABNORMAL HIGH (ref 20.0–28.0)
O2 Saturation: 78.7 %
Patient temperature: 37
pCO2, Ven: 40 mmHg — ABNORMAL LOW (ref 44–60)
pH, Ven: 7.5 — ABNORMAL HIGH (ref 7.25–7.43)
pO2, Ven: 51 mmHg — ABNORMAL HIGH (ref 32–45)

## 2022-09-14 LAB — TROPONIN I (HIGH SENSITIVITY)
Troponin I (High Sensitivity): 104 ng/L (ref <18)
Troponin I (High Sensitivity): 81 ng/L — ABNORMAL HIGH (ref <18)

## 2022-09-14 LAB — LACTIC ACID, PLASMA
Lactic Acid, Venous: 1.4 mmol/L (ref 0.5–1.9)
Lactic Acid, Venous: 0.9 mmol/L (ref 0.5–1.9)

## 2022-09-14 LAB — COMPREHENSIVE METABOLIC PANEL
ALT: 17 U/L (ref 0–44)
AST: 31 U/L (ref 15–41)
Albumin: 3.7 g/dL (ref 3.5–5.0)
Alkaline Phosphatase: 102 U/L (ref 38–126)
Anion gap: 14 (ref 5–15)
BUN: 34 mg/dL — ABNORMAL HIGH (ref 8–23)
CO2: 29 mmol/L (ref 22–32)
Calcium: 9.2 mg/dL (ref 8.9–10.3)
Chloride: 95 mmol/L — ABNORMAL LOW (ref 98–111)
Creatinine, Ser: 6.35 mg/dL — ABNORMAL HIGH (ref 0.61–1.24)
GFR, Estimated: 9 mL/min — ABNORMAL LOW (ref >60)
Glucose, Bld: 80 mg/dL (ref 70–99)
Potassium: 4.5 mmol/L (ref 3.5–5.1)
Sodium: 138 mmol/L (ref 135–145)
Total Bilirubin: 1 mg/dL (ref 0.3–1.2)
Total Protein: 7.3 g/dL (ref 6.5–8.1)

## 2022-09-14 LAB — PROCALCITONIN: Procalcitonin: 0.72 ng/mL

## 2022-09-14 LAB — RESP PANEL BY RT-PCR (RSV, FLU A&B, COVID)  RVPGX2
Influenza A by PCR: NEGATIVE
Influenza B by PCR: NEGATIVE
Resp Syncytial Virus by PCR: NEGATIVE
SARS Coronavirus 2 by RT PCR: NEGATIVE

## 2022-09-14 LAB — PROTIME-INR
INR: 1.1 (ref 0.8–1.2)
Prothrombin Time: 13.9 seconds (ref 11.4–15.2)

## 2022-09-14 LAB — APTT: aPTT: 36 seconds (ref 24–36)

## 2022-09-14 MED ORDER — ONDANSETRON HCL 4 MG PO TABS: 4.0000 mg | ORAL_TABLET | Freq: Four times a day (QID) | ORAL | Status: DC | PRN

## 2022-09-14 MED ORDER — HEPARIN SODIUM (PORCINE) 5000 UNIT/ML IJ SOLN
5000.0000 [IU] | Freq: Three times a day (TID) | INTRAMUSCULAR | Status: DC
  Administered 2022-09-15 – 2022-09-24 (×28): 5000 [IU] via SUBCUTANEOUS
  Filled 2022-09-14 (×28): qty 1

## 2022-09-14 MED ORDER — TRIHEXYPHENIDYL HCL 2 MG PO TABS
2.0000 mg | ORAL_TABLET | Freq: Two times a day (BID) | ORAL | Status: DC
  Administered 2022-09-15 – 2022-09-24 (×19): 2 mg via ORAL
  Filled 2022-09-14 (×21): qty 1

## 2022-09-14 MED ORDER — ACETAMINOPHEN 325 MG RE SUPP
650.0000 mg | Freq: Once | RECTAL | Status: AC
  Administered 2022-09-14: 650 mg via RECTAL
  Filled 2022-09-14: qty 2

## 2022-09-14 MED ORDER — VANCOMYCIN HCL 750 MG/150ML IV SOLN
750.0000 mg | Freq: Once | INTRAVENOUS | Status: AC
  Administered 2022-09-15: 750 mg via INTRAVENOUS
  Filled 2022-09-14: qty 150

## 2022-09-14 MED ORDER — PANTOPRAZOLE SODIUM 40 MG PO TBEC
40.0000 mg | DELAYED_RELEASE_TABLET | Freq: Every day | ORAL | Status: DC
  Administered 2022-09-15 – 2022-09-24 (×10): 40 mg via ORAL
  Filled 2022-09-14 (×10): qty 1

## 2022-09-14 MED ORDER — CARBAMAZEPINE 200 MG PO TABS
1000.0000 mg | ORAL_TABLET | Freq: Every morning | ORAL | Status: DC
  Administered 2022-09-15 – 2022-09-24 (×10): 1000 mg via ORAL
  Filled 2022-09-14 (×10): qty 5

## 2022-09-14 MED ORDER — LEVOTHYROXINE SODIUM 50 MCG PO TABS
50.0000 ug | ORAL_TABLET | Freq: Every day | ORAL | Status: DC
  Administered 2022-09-15 – 2022-09-24 (×10): 50 ug via ORAL
  Filled 2022-09-14 (×10): qty 1

## 2022-09-14 MED ORDER — ACETAMINOPHEN 650 MG RE SUPP: 650.0000 mg | Freq: Four times a day (QID) | RECTAL | Status: DC | PRN

## 2022-09-14 MED ORDER — VANCOMYCIN HCL IN DEXTROSE 1-5 GM/200ML-% IV SOLN
1000.0000 mg | Freq: Once | INTRAVENOUS | Status: AC
  Administered 2022-09-14: 1000 mg via INTRAVENOUS
  Filled 2022-09-14: qty 200

## 2022-09-14 MED ORDER — FLUPHENAZINE HCL 5 MG PO TABS
2.5000 mg | ORAL_TABLET | Freq: Every day | ORAL | Status: DC
  Administered 2022-09-15 – 2022-09-24 (×10): 2.5 mg via ORAL
  Filled 2022-09-14 (×11): qty 1

## 2022-09-14 MED ORDER — ACETAMINOPHEN 325 MG PO TABS
650.0000 mg | ORAL_TABLET | Freq: Four times a day (QID) | ORAL | Status: DC | PRN
  Administered 2022-09-23: 650 mg via ORAL
  Filled 2022-09-14: qty 2

## 2022-09-14 MED ORDER — IOHEXOL 300 MG/ML  SOLN
100.0000 mL | Freq: Once | INTRAMUSCULAR | Status: AC | PRN
  Administered 2022-09-14: 100 mL via INTRAVENOUS

## 2022-09-14 MED ORDER — MORPHINE SULFATE (PF) 2 MG/ML IV SOLN: 2.0000 mg | INTRAVENOUS | Status: DC | PRN

## 2022-09-14 MED ORDER — METRONIDAZOLE 500 MG/100ML IV SOLN
500.0000 mg | Freq: Once | INTRAVENOUS | Status: AC
  Administered 2022-09-14: 500 mg via INTRAVENOUS
  Filled 2022-09-14: qty 100

## 2022-09-14 MED ORDER — HYDROCODONE-ACETAMINOPHEN 5-325 MG PO TABS
1.0000 | ORAL_TABLET | ORAL | Status: DC | PRN
  Administered 2022-09-17 – 2022-09-18 (×2): 2 via ORAL
  Administered 2022-09-19 – 2022-09-24 (×9): 1 via ORAL
  Filled 2022-09-14 (×6): qty 1
  Filled 2022-09-14: qty 2
  Filled 2022-09-14 (×4): qty 1
  Filled 2022-09-14: qty 2

## 2022-09-14 MED ORDER — METRONIDAZOLE 500 MG/100ML IV SOLN
500.0000 mg | Freq: Two times a day (BID) | INTRAVENOUS | Status: DC
  Administered 2022-09-15: 500 mg via INTRAVENOUS
  Filled 2022-09-14: qty 100

## 2022-09-14 MED ORDER — SODIUM CHLORIDE 0.9 % IV SOLN
2.0000 g | Freq: Once | INTRAVENOUS | Status: AC
  Administered 2022-09-14: 2 g via INTRAVENOUS
  Filled 2022-09-14: qty 12.5

## 2022-09-14 MED ORDER — ONDANSETRON HCL 4 MG/2ML IJ SOLN
4.0000 mg | Freq: Four times a day (QID) | INTRAMUSCULAR | Status: DC | PRN
  Administered 2022-09-24: 4 mg via INTRAVENOUS
  Filled 2022-09-14: qty 2

## 2022-09-14 MED ORDER — LACTATED RINGERS IV SOLN: 150.0000 mL/h | INTRAVENOUS | Status: AC

## 2022-09-14 MED ORDER — CALCIUM ACETATE (PHOS BINDER) 667 MG PO CAPS
1334.0000 mg | ORAL_CAPSULE | Freq: Three times a day (TID) | ORAL | Status: DC
  Administered 2022-09-15 – 2022-09-24 (×22): 1334 mg via ORAL
  Filled 2022-09-14 (×24): qty 2

## 2022-09-14 NOTE — Progress Notes (Signed)
PHARMACY -  BRIEF ANTIBIOTIC NOTE   Pharmacy has received consult(s) for vancomycin and cefepime from an ED provider.  The patient's profile has been reviewed for ht/wt/allergies/indication/available labs.    One time order(s) placed for vancomycin 1,000 mg x 1 and cefepime 2 grams x 1  Further antibiotics/pharmacy consults should be ordered by admitting physician if indicated.                       Thank you,  Elliot Gurney, PharmD, BCPS Clinical Pharmacist  09/14/2022 7:16 PM

## 2022-09-14 NOTE — Assessment & Plan Note (Signed)
Continue Prolixin and carbamazepine No acute concerns

## 2022-09-14 NOTE — ED Notes (Signed)
This RN accompanied pt to CT. Pt placed on NRB at 15L O2 for transport

## 2022-09-14 NOTE — ED Triage Notes (Signed)
To room 3 via [facility] from [facility] with c/o lethargy after dialysis today. Pt received full tx at dialysis.  Hx of COPD, wears 02 at 2L via . EMS note pt to be 76% upon arrival to scene. Pt placed on NRB at 15L with improvement to 96%.  Axillary temp 102.5 per EMS. MS [place] in room at this time

## 2022-09-14 NOTE — Assessment & Plan Note (Signed)
Currently normotensive Will hold antihypertensives in view of severe sepsis diagnosis

## 2022-09-14 NOTE — Assessment & Plan Note (Signed)
Continue home inhalers DuoNebs as needed 

## 2022-09-14 NOTE — Assessment & Plan Note (Signed)
Nephrology consult for continuation of dialysis.  Patient had a complete session on 4/15.

## 2022-09-14 NOTE — ED Notes (Signed)
Marchelle Folks, RN attempted in and out urinary catheter x2 with no success. Uncertain if pt produces urine as he has CKD with dialysis. Dr Larinda Buttery aware

## 2022-09-14 NOTE — Assessment & Plan Note (Addendum)
Possible acute on chronic diastolic dysfunction versus renal anasarca EF 06/2022 showing EF 60 to 65% with grade 1 diastolic dysfunction CT with findings of volume overload, mild pulmonary edema with pleural effusions, small volume pericardial effusion and anasarca and small volume fluid ascites Extreme caution with volume resuscitation in the management of sepsis

## 2022-09-14 NOTE — Assessment & Plan Note (Addendum)
Community-acquired pneumonia possible Possible bacteremia.  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  Organ dysfunction of respiratory failure altered mental status. Pro-Calc was 0.72 but with normal lactic acid CT chest abdomen and pelvis consistent with anasarca.  Cannot rule out superimposed pulmonary infection Hesitant to bolus patient with fluids given pleural and pericardial effusions Will treat with IV antibiotics of cefepime vancomycin and Flagyl Will give a 1 L bolus and trickling fluids but with close hemodynamic monitoring for worsening Follow blood cultures

## 2022-09-14 NOTE — ED Provider Notes (Signed)
[facility] Provider Note    Event Date/Time   First MD Initiated Contact with Patient 09/14/22 1908     (approximate)   History   Chief Complaint Fatigue   HPI  Todd Brown is a 62 y.o. male with past medical history of hypertension, schizophrenia, ESRD on HD (MWF), COPD, chronic hypoxic respiratory failure on 2 L nasal cannula, and DVT who presents to the ED for altered mental status.  Per EMS, patient went to his usual dialysis appointment earlier today and received a full run without difficulty.  When he returned home to [facility], staff was concerned that he seemed weaker than usual with elevated blood pressure.  EMS was called and found patient sitting in a wheelchair, looking very weak and somnolent.  EMS states that patient was able to follow commands during transport, noted to be febrile, tachycardic, and hypoxic to the mid 70s on his usual 2 L nasal cannula.  EMS gave 1 DuoNeb prior to arrival for rhonchi.  Patient was placed on a nonrebreather with improvement.     Physical Exam   Triage Vital Signs: ED Triage Vitals [09/14/22 1907]  Enc Vitals Group     BP      Pulse      Resp      Temp      Temp src      SpO2      Weight 175 lb (79.4 kg)     Height  (1.803 m)     Head Circumference      Peak Flow      Pain Score 0     Pain Loc      Pain Edu?      Excl. in GC?     Most recent vital signs: Vitals:   09/14/22 2109 09/14/22 2115  BP:  136/82  Pulse:  92  Resp:  (!) 25  Temp: (!) 101.4 F (38.6 C)   SpO2:  94%    Constitutional: Somnolent but arousable to voice, unable to answer questions beyond yes or no, unable to follow commands. Eyes: Conjunctivae are normal.  Pupils equal, round, and reactive to light bilaterally. Head: Atraumatic. Nose: No congestion/rhinnorhea. Mouth/Throat: Mucous membranes are moist.  Cardiovascular: Tachycardic, regular rhythm. Grossly normal heart sounds.  2+  radial pulses bilaterally.  Left upper extremity AV fistula with palpable thrill, no associated erythema, warmth, or tenderness noted. Respiratory: Tachypneic with increased respiratory effort, no retractions noted.  Rhonchi noted bilaterally with no wheezing or rails. Gastrointestinal: Soft and nontender. No distention. Musculoskeletal: No lower extremity tenderness nor edema.  Neurologic: Responds with single words, unable to follow commands.  Withdraws from pain in all 4 extremities.    ED Results / Procedures / Treatments   Labs (all labs ordered are listed, but only abnormal results are displayed) Labs Reviewed  COMPREHENSIVE METABOLIC PANEL - Abnormal; Notable for the following components:      Result Value   Chloride 95 (*)    BUN 34 (*)    Creatinine, Ser 6.35 (*)    GFR, Estimated 9 (*)    All other components within normal limits  CBC WITH DIFFERENTIAL/PLATELET - Abnormal; Notable for the following components:   WBC 12.1 (*)    RBC 3.93 (*)    MCV 101.8 (*)    Neutro Abs 10.9 (*)    Lymphs Abs 0.2 (*)    All other components within normal limits  BLOOD GAS, VENOUS -  Abnormal; Notable for the following components:   pH, Ven 7.5 (*)    pCO2, Ven 40 (*)    pO2, Ven 51 (*)    Bicarbonate 31.2 (*)    Acid-Base Excess 7.4 (*)    All other components within normal limits  TROPONIN I (HIGH SENSITIVITY) - Abnormal; Notable for the following components:   Troponin I (High Sensitivity) 104 (*)    All other components within normal limits  RESP PANEL BY RT-PCR (RSV, FLU A&B, COVID)  RVPGX2  CULTURE, BLOOD (ROUTINE X 2)  CULTURE, BLOOD (ROUTINE X 2)  LACTIC ACID, PLASMA  LACTIC ACID, PLASMA  PROTIME-INR  APTT  PROCALCITONIN  TROPONIN I (HIGH SENSITIVITY)     EKG  ED ECG REPORT I, Chesley Noon, the attending physician, personally viewed and interpreted this ECG.   Date: 09/14/2022  EKG Time: 19:17  Rate: 109  Rhythm: sinus tachycardia  Axis: RAD   Intervals:none  ST&T Change: None  RADIOLOGY Chest x-ray reviewed and interpreted by me with cardiomegaly and multifocal infiltrates concerning for edema versus infection.  PROCEDURES:  Critical Care performed: Yes, see critical care procedure note(s)  .Critical Care  Performed by: Chesley Noon, MD Authorized by: Chesley Noon, MD   Critical care provider statement:    Critical care time (minutes):  30   Critical care time was exclusive of:  Separately billable procedures and treating other patients and teaching time   Critical care was necessary to treat or prevent imminent or life-threatening deterioration of the following conditions:  Sepsis   Critical care was time spent personally by me on the following activities:  Development of treatment plan with patient or surrogate, discussions with consultants, evaluation of patient's response to treatment, examination of patient, ordering and review of laboratory studies, ordering and review of radiographic studies, ordering and performing treatments and interventions, pulse oximetry, re-evaluation of patient's condition and review of old charts   I assumed direction of critical care for this patient from another provider in my specialty: no     Care discussed with: admitting provider      MEDICATIONS ORDERED IN ED: Medications  vancomycin (VANCOCIN) IVPB 1000 mg/200 mL premix (1,000 mg Intravenous New Bag/Given 09/14/22 2108)  ceFEPIme (MAXIPIME) 2 g in sodium chloride 0.9 % 100 mL IVPB (0 g Intravenous Stopped 09/14/22 1949)  metroNIDAZOLE (FLAGYL) IVPB 500 mg (0 mg Intravenous Stopped 09/14/22 2052)  acetaminophen (TYLENOL) suppository 650 mg (650 mg Rectal Given 09/14/22 1930)  iohexol (OMNIPAQUE) 300 MG/ML solution 100 mL (100 mLs Intravenous Contrast Given 09/14/22 2050)     IMPRESSION / MDM / ASSESSMENT AND PLAN / ED COURSE  I reviewed the triage vital signs and the nursing notes.                              62 y.o. male  with past medical history of hypertension, schizophrenia, ESRD on HD (MWF), COPD, chronic hypoxic respiratory failure on 2 L nasal cannula, and DVT who presents to the ED with significant weakness and somnolence noted after his dialysis appointment earlier today, noted to be febrile with EMS.  Patient's presentation is most consistent with acute presentation with potential threat to life or bodily function.  Differential diagnosis includes, but is not limited to, sepsis, UTI, pneumonia, COPD exacerbation, aspiration, cellulitis, intra-abdominal infection, electrolyte abnormality.  Patient ill-appearing and in moderate respiratory distress, tachypneic with oxygen saturations of 88% on 5 L nasal cannula.  We  will place him on a high flow nasal cannula as I am concerned that his mental status is not strong enough to tolerate BiPAP.  He is febrile and tachycardic, presentation concerning for sepsis and we will start on broad-spectrum antibiotics, give dose of rectal Tylenol.  I would favor respiratory source for his sepsis and chest x-ray is pending at this time.  Chest x-ray shows multifocal infiltrates concerning for edema versus infection, would favor infectious process given patient underwent dialysis earlier today.  Labs show mild leukocytosis consistent with sepsis, no significant anemia or electrolyte abnormality noted.  Troponin elevated but consistent with prior baseline, VBG is reassuring without significant respiratory acidosis.  CT of chest/abdomen/pelvis performed given unclear source of sepsis, no intra-abdominal process noted but lungs show mild pulmonary edema with possible superimposed infection.  Attempt was made to obtain cath urine sample, but no sample was obtained and suspect patient does not make much urine.  Mental status does appear to be improving on reassessment, he is now more alert and able to answer more questions, currently denies pain.  Case discussed with hospitalist for  admission.      FINAL CLINICAL IMPRESSION(S) / ED DIAGNOSES   Final diagnoses:  Sepsis with acute hypoxic respiratory failure without septic shock, due to unspecified organism  Pneumonia of both lungs due to infectious organism, unspecified part of lung  ESRD on hemodialysis     Rx / DC Orders   ED Discharge Orders     None        Note:  This document was prepared using Dragon voice recognition software and may include unintentional dictation errors.   Chesley Noon, MD 09/14/22 2155

## 2022-09-14 NOTE — ED Notes (Signed)
RRT called for HFNC at this time per MD [facility]

## 2022-09-14 NOTE — Sepsis Progress Note (Signed)
[facility] following for sepsis protocol. 

## 2022-09-14 NOTE — H&P (Signed)
History and Physical    Patient: Todd Brown YIA:[id number] DOB: 09/17/1960 DOA: 09/14/2022 DOS: the patient was seen and examined on 09/14/2022 PCP: Smiley Houseman, NP  Patient coming from: ALF/ILF  Chief Complaint:  Chief Complaint  Patient presents with   Fatigue    HPI: Todd Brown is a 62 y.o. 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  Patient was in his usual state of health earlier in today and went to and completed his dialysis session but on arrival home he was lethargic.  On arrival of EMS he was saturating in the 70s on room air and was placed on NRB for transport. ED course and data review: Tmax 104.2 with pulse 107 respirations 23 and O2 sat initially 89% on NRB improving to 94% on HFNC at 6 L.  BP was 154/86 on arrival.  VBG on HFNC showed pH 7.5 with pCO2 40.  Labs otherwise significant for WBC 12,100, lactic acid 0.9 and procalcitonin 0.72.  Respiratory viral panel negative for COVID flu and RSV.  Troponin 81. EKG, personally viewed and interpreted showing sinus tachycardia with no acute ST-T wave changes. CT chest abdomen and pelvis suggested volume overload cannot rule out superimposed infection as further detailed below: IMPRESSION: 1. Findings of volume overload. 2. Mild pulmonary edema with bilateral trace, left greater than right, pleural effusions. Associated bilateral lower lobe and lingular atelectasis. Superimposed infection not excluded. 3. Cardiomegaly. 4. Small volume pericardial effusion. 5. At least small volume simple free fluid ascites. 6. Anasarca.  Patient started on cefepime vancomycin and Flagyl for  sepsis of unknown source.  Hospitalist consulted for admission.    Past Medical History:  Diagnosis Date   Anemia    Arthritis    Chronic respiratory failure    COPD (chronic obstructive pulmonary disease)    Dialysis patient    Mon. -Wed.- Fri   DVT (deep venous thrombosis)    cephalic and basolic vein thrombosis   ESRD (end stage renal disease)    ESRD (end stage renal disease) on dialysis    Hyperlipidemia    Hypertension    Malignant hypertension    Pneumonia 2016   Renal artery stenosis    Schizophrenia    Past Surgical History:  Procedure Laterality Date   A/V FISTULAGRAM N/A 08/06/2016   Procedure: A/V Fistulagram;  Surgeon: Annice Needy, MD;  Location: [facility];  Service: Cardiovascular;  Laterality: N/A;   A/V FISTULAGRAM N/A 02/14/2021   Procedure: A/V FISTULAGRAM poss Perm Cath Insertion;  Surgeon: Renford Dills, MD;  Location: [facility];  Service: Cardiovascular;  Laterality: N/A;   A/V FISTULAGRAM Left 08/26/2021   Procedure: A/V Fistulagram;  Surgeon: Annice Needy, MD;  Location: [facility];  Service: Cardiovascular;  Laterality: Left;   A/V SHUNT INTERVENTION N/A 08/06/2016   Procedure: A/V Shunt Intervention;  Surgeon: Annice Needy, MD;  Location: [facility];  Service: Cardiovascular;  Laterality: N/A;   AV FISTULA PLACEMENT Left 12/26/2014   Procedure: ARTERIOVENOUS (AV) FISTULA CREATION;  Surgeon: Annice Needy, MD;  Location: [facility];  Service: Vascular;  Laterality: Left;   AV FISTULA PLACEMENT     ESOPHAGOGASTRODUODENOSCOPY (EGD)  WITH PROPOFOL N/A 05/06/2017   Procedure: ESOPHAGOGASTRODUODENOSCOPY (EGD) WITH PROPOFOL;  Surgeon: Wyline Mood, MD;  Location: [facility];  Service: Gastroenterology;  Laterality: N/A;   INSERTION OF DIALYSIS CATHETER Right    PERIPHERAL VASCULAR CATHETERIZATION N/A 10/22/2014   Procedure: Dialysis/Perma Catheter Insertion;  Surgeon: Annice Needy, MD;  Location: [facility];   Service: Cardiovascular;  Laterality: N/A;   PERIPHERAL VASCULAR CATHETERIZATION N/A 02/28/2015   Procedure: Dialysis/Perma Catheter Removal;  Surgeon: Annice Needy, MD;  Location: [facility];  Service: Cardiovascular;  Laterality: N/A;   Repair fx left lower leg     yrs ago (age 66)   REVISON OF ARTERIOVENOUS FISTULA Left 03/27/2022   Procedure: REVISON OF ARTERIOVENOUS FISTULA (JUMP GRAFT);  Surgeon: Renford Dills, MD;  Location: [facility];  Service: Vascular;  Laterality: Left;   TONSILLECTOMY     Social History:  reports that he has never smoked. He has never been exposed to tobacco smoke. He has never used smokeless tobacco. He reports that he does not currently use alcohol. He reports that he does not currently use drugs after having used the following drugs: Marijuana.  Allergies  Allergen Reactions   Chlorpromazine Other (See Comments)    Reaction:  Unknown , pt states it makes him feel real bad Reaction:  Unknown , pt states it makes him feel real bad     Family History  Problem Relation Age of Onset   Kidney disease Brother    Diabetes Neg Hx     Prior to Admission medications   Medication Sig Start Date End Date Taking? Authorizing Provider  albuterol (VENTOLIN HFA) 108 (90 Base) MCG/ACT inhaler Inhale 1-2 puffs into the lungs every 4 (four) hours as needed for wheezing or shortness of breath.    [provider]  amLODipine (NORVASC) 10 MG tablet Take 10 mg by mouth every morning.    [provider]  b complex-vitamin c-folic acid (NEPHRO-VITE) 0.8 MG TABS tablet Take 1 tablet by mouth at bedtime.    [provider]  calcium acetate (PHOSLO) 667 MG capsule Take 1,334 mg by mouth 3 (three) times daily with meals.    [provider]  carbamazepine (TEGRETOL) 200 MG tablet Take 1,000 mg by mouth every morning.    [provider]  cholecalciferol (VITAMIN D) 1000 UNITS tablet Take 1,000 Units by mouth in the morning.     [provider]  cloNIDine (CATAPRES - DOSED IN MG/24 HR) 0.2 mg/24hr patch Place 1 patch (0.2 mg total) onto the skin once a week. 02/23/22   Marrion Coy, MD  doxazosin (CARDURA) 4 MG tablet Take 1 tablet (4 mg total) by mouth at bedtime. 02/21/22   Marrion Coy, MD  ferrous sulfate 325 (65 FE) MG tablet Take 1 tablet (325 mg total) by mouth daily. 05/19/15   Enedina Finner, MD  fluPHENAZine (PROLIXIN) 2.5 MG tablet Take 2.5 mg by mouth in the morning.    [provider]  Fluticasone-Salmeterol (ADVAIR) 250-50 MCG/DOSE AEPB Inhale 1 puff into the lungs 2 (two) times daily.    [provider]  hydrALAZINE (APRESOLINE) 25 MG tablet Take 50 mg by mouth 3 (three) times daily.    [provider]  isosorbide mononitrate (IMDUR) 60 MG 24 hr tablet Take 60 mg by mouth 2 (two) times daily.    [provider]  levothyroxine (SYNTHROID) 50 MCG tablet Take 1 tablet (50 mcg total) by mouth daily at 6 (six) AM.  07/01/22 07/31/22  Baron Hamper, MD  minoxidil (LONITEN) 2.5 MG tablet Take 1 tablet (2.5 mg total) by mouth daily. 02/22/22   Marrion Coy, MD  omeprazole (PRILOSEC) 40 MG capsule Take 40 mg by mouth every morning.    [provider]  polyethylene glycol (MIRALAX / GLYCOLAX) 17 g packet Take 17 g by mouth daily as needed for mild constipation.    [provider]  promethazine-dextromethorphan (PROMETHAZINE-DM) 6.25-15 MG/5ML syrup Take 5 mLs by mouth every 6 (six) hours as needed for cough.    [provider]  trihexyphenidyl (ARTANE) 2 MG tablet Take 2 mg by mouth 2 (two) times daily.    [provider]    Physical Exam: Vitals:   09/14/22 1949 09/14/22 2052 09/14/22 2109 09/14/22 2115  BP:  128/75  136/82  Pulse:  94  92  Resp:  (!) 24  (!) 25  Temp: (!) 104.2 F (40.1 C)  (!) 101.4 F (38.6 C)   TempSrc: Rectal  Rectal   SpO2:  95%  94%  Weight:      Height:       Physical Exam Vitals and nursing note reviewed.   Constitutional:      General: He is not in acute distress.    Comments: Lethargic and somnolent  HENT:     Head: Normocephalic and atraumatic.  Cardiovascular:     Rate and Rhythm: Regular rhythm. Tachycardia present.     Heart sounds: Normal heart sounds.  Pulmonary:     Effort: Pulmonary effort is normal.     Breath sounds: Normal breath sounds.  Abdominal:     Palpations: Abdomen is soft.     Tenderness: There is no abdominal tenderness.  Musculoskeletal:     Right lower leg: Edema present.     Left lower leg: Edema present.  Neurological:     Mental Status: Mental status is at baseline.     Labs on Admission: I have personally reviewed following labs and imaging studies  CBC: Recent Labs  Lab 09/14/22 1910  WBC 12.1*  NEUTROABS 10.9*  HGB 13.2  HCT 40.0  MCV 101.8*  PLT 183   Basic Metabolic Panel: Recent Labs  Lab 09/14/22 1910  NA 138  K 4.5  CL 95*  CO2 29  GLUCOSE 80  BUN 34*  CREATININE 6.35*  CALCIUM 9.2   GFR: Estimated Creatinine Clearance: 13 mL/min (A) (by C-G formula based on SCr of 6.35 mg/dL (H)). Liver Function Tests: Recent Labs  Lab 09/14/22 1910  AST 31  ALT 17  ALKPHOS 102  BILITOT 1.0  PROT 7.3  ALBUMIN 3.7   No results for input(s): "LIPASE", "AMYLASE" in the last 168 hours. No results for input(s): "AMMONIA" in the last 168 hours. Coagulation Profile: Recent Labs  Lab 09/14/22 1910  INR 1.1   Cardiac Enzymes: No results for input(s): "CKTOTAL", "CKMB", "CKMBINDEX", "TROPONINI" in the last 168 hours. BNP (last 3 results) No results for input(s): "PROBNP" in the last 8760 hours. HbA1C: No results for input(s): "HGBA1C" in the last 72 hours. CBG: No results for input(s): "GLUCAP" in the last 168 hours. Lipid Profile: No results for input(s): "CHOL", "HDL", "LDLCALC", "TRIG", "CHOLHDL", "LDLDIRECT" in the last 72 hours. Thyroid Function Tests: No results for input(s): "TSH", "T4TOTAL", "FREET4", "T3FREE",  "THYROIDAB" in the last 72 hours. Anemia Panel: No results for input(s): "VITAMINB12", "FOLATE", "FERRITIN", "TIBC", "IRON", "RETICCTPCT" in the last 72 hours. Urine analysis:    Component Value Date/Time  COLORURINE YELLOW (A) 05/17/2015 0012   APPEARANCEUR HAZY (A) 05/17/2015 0012   LABSPEC 1.009 05/17/2015 0012   PHURINE 7.0 05/17/2015 0012   GLUCOSEU NEGATIVE 05/17/2015 0012   HGBUR 1+ (A) 05/17/2015 0012   BILIRUBINUR NEGATIVE 05/17/2015 0012   KETONESUR NEGATIVE 05/17/2015 0012   PROTEINUR 30 (A) 05/17/2015 0012   NITRITE NEGATIVE 05/17/2015 0012   LEUKOCYTESUR 3+ (A) 05/17/2015 0012    Radiological Exams on Admission: CT CHEST ABDOMEN PELVIS W CONTRAST  Result Date: 09/14/2022 CLINICAL DATA:  Sepsis. Pt with c/o lethargy after dialysis today. Pt received full tx at dialysis. Hx of COPD, wears 02 at 2L via [place]. EMS note pt to be 76% upon arrival to scene. EXAM: CT CHEST, ABDOMEN, AND PELVIS WITH CONTRAST TECHNIQUE: Multidetector CT imaging of the chest, abdomen and pelvis was performed following the standard protocol during bolus administration of intravenous contrast. RADIATION DOSE REDUCTION: This exam was performed according to the departmental dose-optimization program which includes automated exposure control, adjustment of the mA and/or kV according to patient size and/or use of iterative reconstruction technique. CONTRAST:  OMNIPAQUE IOHEXOL 300 MG/ML  SOLN COMPARISON:  CT chest 06/19/2022 FINDINGS: CT CHEST FINDINGS Cardiovascular: Enlarged heart size. Small volume pericardial effusion. The thoracic aorta is normal in caliber. Mild atherosclerotic plaque of the thoracic aorta. Four vessel coronary artery calcifications. The main pulmonary artery is borderline enlarged measuring up to 3.1 cm. No central pulmonary embolus. Mediastinum/Nodes: No enlarged mediastinal, hilar, or axillary lymph nodes. Thyroid gland, trachea, and esophagus demonstrate no significant findings.  Lungs/Pleura: Interlobular septal wall thickening. Bibasilar passive atelectasis. Lingular ground-glass airspace opacity likely atelectasis. No focal consolidation. No pulmonary nodule. No pulmonary mass. Bilateral trace, left greater than right, pleural effusions. No pneumothorax. Musculoskeletal: No chest wall abnormality.  Bilateral gynecomastia. No suspicious lytic or blastic osseous lesions. No acute displaced fracture. Old healed sternal fracture. CT ABDOMEN PELVIS FINDINGS Hepatobiliary: No focal liver abnormality. No gallstones, gallbladder wall thickening, or pericholecystic fluid. No biliary dilatation. Pancreas: No focal lesion. Normal pancreatic contour. No surrounding inflammatory changes. No main pancreatic ductal dilatation. Spleen: Normal in size without focal abnormality. Adrenals/Urinary Tract: Bilateral adrenal gland hyperplasia with a poorly visualized 2 cm left adrenal gland nodule demonstrating a density of 7 Hounsfield units-consistent with a lipid rich adenoma, no further follow-up indicated. Bilateral renal cortical scarring. Bilateral kidneys enhance symmetrically. Fluid density lesions likely represent simple renal cysts. Simple renal cysts, in the absence of clinically indicated signs/symptoms, require no independent follow-up. No hydronephrosis. No hydroureter. The urinary bladder is unremarkable. No excretion of intravenous contrast on delayed imaging. Stomach/Bowel: Stomach is within normal limits. No evidence of bowel wall thickening or dilatation. Appendix appears normal. Vascular/Lymphatic: No abdominal aorta or iliac aneurysm. At least moderate atherosclerotic plaque of the aorta and its branches. No abdominal, pelvic, or inguinal lymphadenopathy. Reproductive: Prostate is unremarkable. Other: At least small volume simple free fluid within the abdomen pelvis. No intraperitoneal free gas. No organized fluid collection. Musculoskeletal: No abdominal wall hernia or abnormality. No  suspicious lytic or blastic osseous lesions. No acute displaced fracture. IMPRESSION: 1. Findings of volume overload. 2. Mild pulmonary edema with bilateral trace, left greater than right, pleural effusions. Associated bilateral lower lobe and lingular atelectasis. Superimposed infection not excluded. 3. Cardiomegaly. 4. Small volume pericardial effusion. 5. At least small volume simple free fluid ascites. 6. Anasarca. 7. Aortic Atherosclerosis (ICD10-I70.0) including four-vessel coronary calcification. 8. No excretion of intravenous contrast on delayed imaging. Correlate with creatinine levels. Electronically Signed  By: Tish Frederickson M.D.   On: 09/14/2022 21:25   DG Chest Port 1 View  Result Date: 09/14/2022 CLINICAL DATA:  Questionable sepsis - evaluate for abnormality Lethargy after dialysis today. EXAM: PORTABLE CHEST 1 VIEW COMPARISON:  Radiograph and CT 06/19/2022 FINDINGS: The previous right-sided dialysis catheter is been removed. Cardiomegaly is unchanged. Mediastinal contours are unchanged. Small bilateral pleural effusions which have increased from prior exam. Diffuse interstitial thickening may represent pulmonary edema superimposed on emphysema. No pneumothorax. IMPRESSION: Unchanged cardiomegaly. Small bilateral pleural effusions have increased from prior exam. Diffuse interstitial thickening may represent pulmonary edema superimposed on emphysema. Overall findings suggest fluid overload/CHF. Electronically Signed   By: Narda Rutherford M.D.   On: 09/14/2022 19:43     Data Reviewed: Relevant notes from primary care and specialist visits, past discharge summaries as available in EHR, including Care Everywhere. Prior diagnostic testing as pertinent to current admission diagnoses Updated medications and problem lists for reconciliation ED course, including vitals, labs, imaging, treatment and response to treatment Triage notes, nursing and pharmacy notes and ED provider's notes Notable  results as noted in HPI   Assessment and Plan: * Acute on chronic respiratory failure with hypoxia O2 sat in the 70s with EMS, on NRB for transport, now on HFNC Etiology primarily CHF and CAP and possible respiratory failure related to possible sepsis Continue high flow nasal cannula to maintain sats over 94% and wean as tolerated Treat CHF, CAP, possible sepsis as detailed under the respective problem  Severe sepsis versus SIRS Community-acquired pneumonia possible Possible bacteremia.  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  Organ dysfunction of respiratory failure altered mental status. Pro-Calc was 0.72 but with normal lactic acid CT chest abdomen and pelvis consistent with anasarca.  Cannot rule out superimposed pulmonary infection Hesitant to bolus patient with fluids given pleural and pericardial effusions Will treat with IV antibiotics of cefepime vancomycin and Flagyl Will give a 1 L bolus and trickling fluids but with close hemodynamic monitoring for worsening Follow blood cultures   Anasarca (mild pulmonary edema, pleural and pericardial effusion, ascites on CT) Possible acute on chronic diastolic dysfunction versus renal anasarca EF 06/2022 showing EF 60 to 65% with grade 1 diastolic dysfunction CT with findings of volume overload, mild pulmonary edema with pleural effusions, small volume pericardial effusion and anasarca and small volume fluid ascites Extreme caution with volume resuscitation in the management of sepsis   Acute metabolic encephalopathy Secondary to suspected sepsis Neurologic checks with fall and aspiration precautions Treat sepsis as outlined on the specific problem  ESRD on dialysis Nephrology consult for continuation of dialysis.  Patient had a complete session on 4/15.  Essential hypertension Currently  normotensive Will hold antihypertensives in view of severe sepsis diagnosis  COPD (chronic obstructive pulmonary disease) Continue home inhalers DuoNebs as needed  Schizophrenia Continue Prolixin and carbamazepine No acute concerns   DVT prophylaxis: heparin  Consults: Nephrology, Dr. Cherylann Ratel  Advance Care Planning:   Code Status: Prior   Family Communication: none  Disposition Plan: Back to previous home environment  Severity of Illness: The appropriate patient status for this patient is INPATIENT. Inpatient status is judged to be reasonable and necessary in order to provide the required intensity of service to ensure the patient's safety. The patient's presenting symptoms, physical exam findings, and initial radiographic and laboratory data in the context of their chronic comorbidities is felt to  place them at high risk for further clinical deterioration. Furthermore, it is not anticipated that the patient will be medically stable for discharge from the hospital within 2 midnights of admission.   * I certify that at the point of admission it is my clinical judgment that the patient will require inpatient hospital care spanning beyond 2 midnights from the point of admission due to high intensity of service, high risk for further deterioration and high frequency of surveillance required.*  Author: Andris Baumann, MD 09/14/2022 10:10 PM  For on call review [email or web address].

## 2022-09-14 NOTE — Assessment & Plan Note (Addendum)
Secondary to suspected sepsis Neurologic checks with fall and aspiration precautions Treat sepsis as outlined on the specific problem

## 2022-09-14 NOTE — Assessment & Plan Note (Addendum)
O2 sat in the 70s with EMS, on NRB for transport, now on HFNC Etiology primarily CHF and CAP and possible respiratory failure related to possible sepsis Continue high flow nasal cannula to maintain sats over 94% and wean as tolerated Treat CHF, CAP, possible sepsis as detailed under the respective problem

## 2022-09-14 NOTE — Progress Notes (Signed)
CODE SEPSIS - PHARMACY COMMUNICATION  **Broad Spectrum Antibiotics should be administered within 1 hour of Sepsis diagnosis**  Time Code Sepsis Called/Page Received: 1914  Antibiotics Ordered: vancomycin 1,000 mg x 1, cefepime 2 grams x 1  Time of 1st antibiotic administration: 1927  Additional action taken by pharmacy: none  If necessary, Name of Provider/Nurse Contacted: n/a   Elliot Gurney, PharmD, BCPS Clinical Pharmacist  09/14/2022 7:18 PM

## 2022-09-14 NOTE — Assessment & Plan Note (Signed)
History of schizophrenia Continue Prolixin, Tegretol

## 2022-09-15 ENCOUNTER — Encounter
Admission: EM | Disposition: A | Discharge status: Nursing Home (Includes ICF) | Payer: Self-pay | Source: Skilled Nursing Facility | Attending: Osteopathic Medicine

## 2022-09-15 ENCOUNTER — Ambulatory Visit: Admission: RE | Admit: 2022-09-15 | Payer: Medicaid Other | Source: Home / Self Care | Admitting: Vascular Surgery

## 2022-09-15 ENCOUNTER — Other Ambulatory Visit: Payer: Self-pay

## 2022-09-15 DIAGNOSIS — J9621 Acute and chronic respiratory failure with hypoxia: Secondary | ICD-10-CM | POA: Diagnosis not present

## 2022-09-15 DIAGNOSIS — G9341 Metabolic encephalopathy: Secondary | ICD-10-CM | POA: Diagnosis not present

## 2022-09-15 DIAGNOSIS — R7881 Bacteremia: Secondary | ICD-10-CM | POA: Diagnosis not present

## 2022-09-15 DIAGNOSIS — Z992 Dependence on renal dialysis: Secondary | ICD-10-CM

## 2022-09-15 DIAGNOSIS — B955 Unspecified streptococcus as the cause of diseases classified elsewhere: Secondary | ICD-10-CM

## 2022-09-15 DIAGNOSIS — I120 Hypertensive chronic kidney disease with stage 5 chronic kidney disease or end stage renal disease: Secondary | ICD-10-CM | POA: Diagnosis not present

## 2022-09-15 DIAGNOSIS — F203 Undifferentiated schizophrenia: Secondary | ICD-10-CM

## 2022-09-15 DIAGNOSIS — N186 End stage renal disease: Secondary | ICD-10-CM

## 2022-09-15 LAB — BLOOD CULTURE ID PANEL (REFLEXED) - BCID2

## 2022-09-15 LAB — CBC
HCT: 40.2 % (ref 39.0–52.0)
Hemoglobin: 13.3 g/dL (ref 13.0–17.0)
MCH: 33.7 pg (ref 26.0–34.0)
MCHC: 33.1 g/dL (ref 30.0–36.0)
MCV: 101.8 fL — ABNORMAL HIGH (ref 80.0–100.0)
Platelets: 186 10*3/uL (ref 150–400)
RBC: 3.95 MIL/uL — ABNORMAL LOW (ref 4.22–5.81)
RDW: 14.5 % (ref 11.5–15.5)
WBC: 12.9 10*3/uL — ABNORMAL HIGH (ref 4.0–10.5)
nRBC: 0 % (ref 0.0–0.2)

## 2022-09-15 LAB — HEPATITIS B SURFACE ANTIGEN: Hepatitis B Surface Ag: NONREACTIVE

## 2022-09-15 LAB — CULTURE, BLOOD (ROUTINE X 2)

## 2022-09-15 LAB — CORTISOL-AM, BLOOD: Cortisol - AM: 13.1 ug/dL (ref 6.7–22.6)

## 2022-09-15 LAB — RENAL FUNCTION PANEL
Albumin: 2.9 g/dL — ABNORMAL LOW (ref 3.5–5.0)
Anion gap: 12 (ref 5–15)
BUN: 44 mg/dL — ABNORMAL HIGH (ref 8–23)
CO2: 27 mmol/L (ref 22–32)
Calcium: 9.1 mg/dL (ref 8.9–10.3)
Chloride: 94 mmol/L — ABNORMAL LOW (ref 98–111)
Creatinine, Ser: 7.3 mg/dL — ABNORMAL HIGH (ref 0.61–1.24)
GFR, Estimated: 8 mL/min — ABNORMAL LOW (ref >60)
Glucose, Bld: 98 mg/dL (ref 70–99)
Phosphorus: 3.7 mg/dL (ref 2.5–4.6)
Potassium: 4.7 mmol/L (ref 3.5–5.1)
Sodium: 133 mmol/L — ABNORMAL LOW (ref 135–145)

## 2022-09-15 LAB — PROCALCITONIN: Procalcitonin: 6.52 ng/mL

## 2022-09-15 SURGERY — A/V FISTULAGRAM
Anesthesia: Moderate Sedation | Laterality: Left

## 2022-09-15 MED ORDER — VANCOMYCIN HCL 750 MG/150ML IV SOLN: 750.0000 mg | INTRAVENOUS | Status: DC

## 2022-09-15 MED ORDER — SODIUM CHLORIDE 0.9 % IV SOLN: 2.0000 g | Freq: Two times a day (BID) | INTRAVENOUS | Status: DC

## 2022-09-15 MED ORDER — HEPARIN SODIUM (PORCINE) 1000 UNIT/ML DIALYSIS: 1000.0000 [IU] | INTRAMUSCULAR | Status: DC | PRN

## 2022-09-15 MED ORDER — CHLORHEXIDINE GLUCONATE CLOTH 2 % EX PADS
6.0000 | MEDICATED_PAD | Freq: Every day | CUTANEOUS | Status: DC
  Administered 2022-09-18: 6 via TOPICAL
  Filled 2022-09-15 (×2): qty 6

## 2022-09-15 MED ORDER — ANTICOAGULANT SODIUM CITRATE 4% (200MG/5ML) IV SOLN: 5.0000 mL | Status: DC | PRN

## 2022-09-15 MED ORDER — HEPARIN SODIUM (PORCINE) 1000 UNIT/ML IJ SOLN
INTRAMUSCULAR | Status: AC
  Filled 2022-09-15: qty 10

## 2022-09-15 MED ORDER — LIDOCAINE-PRILOCAINE 2.5-2.5 % EX CREA: 1.0000 | TOPICAL_CREAM | CUTANEOUS | Status: DC | PRN

## 2022-09-15 MED ORDER — PENTAFLUOROPROP-TETRAFLUOROETH EX AERO: 1.0000 | INHALATION_SPRAY | CUTANEOUS | Status: DC | PRN

## 2022-09-15 MED ORDER — SODIUM CHLORIDE 0.9 % IV SOLN
2.0000 g | INTRAVENOUS | Status: DC
  Administered 2022-09-15 – 2022-09-24 (×10): 2 g via INTRAVENOUS
  Filled 2022-09-15 (×4): qty 20
  Filled 2022-09-15 (×3): qty 2
  Filled 2022-09-15 (×2): qty 20
  Filled 2022-09-15: qty 2

## 2022-09-15 MED ORDER — LIDOCAINE HCL (PF) 1 % IJ SOLN: 5.0000 mL | INTRAMUSCULAR | Status: DC | PRN

## 2022-09-15 MED ORDER — ALTEPLASE 2 MG IJ SOLR: 2.0000 mg | Freq: Once | INTRAMUSCULAR | Status: DC | PRN

## 2022-09-15 MED ORDER — EPOETIN ALFA 4000 UNIT/ML IJ SOLN
INTRAMUSCULAR | Status: AC
  Filled 2022-09-15: qty 1

## 2022-09-15 NOTE — Progress Notes (Signed)
Pharmacy Antibiotic Note  Todd Brown is a 62 y.o. male w/ ESRD on MWF HD admitted on 09/14/2022 with infection of unknown source.  Pharmacy has been consulted for Cefepime & Vancomycin dosing for 7 days.  Plan: Cefepime 2 q12hr per indication & ESRD status.  Pt given Vancomycin 1750 mg once. Vancomycin 750 mg IV Q MWF post HD per pt wt: 79.4 kg.  Pharmacy will continue to follow and will adjust abx dosing whenever warranted.  Temp (24hrs), Avg:101.7 F (38.7 C), Min:99.4 F (37.4 C), Max:104.2 F (40.1 C)   Recent Labs  Lab 09/14/22 1910 09/14/22 2114  WBC 12.1*  --   CREATININE 6.35*  --   LATICACIDVEN 1.4 0.9    Estimated Creatinine Clearance: 13 mL/min (A) (by C-G formula based on SCr of 6.35 mg/dL (H)).    Allergies  Allergen Reactions   Chlorpromazine Other (See Comments)    Reaction:  Unknown , pt states it makes him feel real bad Reaction:  Unknown , pt states it makes him feel real bad     Antimicrobials this admission: 4/15 Cefepime >> x 7 days 4/15 Vancomycin >> x 7 days 4/15 Flagyl >> x 7 days  Microbiology results: 4/15 BCx: Pending  Thank you for allowing pharmacy to be a part of this patient's care.  Otelia Sergeant, PharmD, [facility] 09/15/2022 12:31 AM

## 2022-09-15 NOTE — ED Notes (Signed)
RN assisted in feeding patient

## 2022-09-15 NOTE — Consult Note (Addendum)
NAME: Todd Brown  DOB: 03/03/1961  MRN: [id number]  Date/Time: 09/15/2022 5:44 PM  REQUESTING PROVIDER: Dr. Para March Subjective:  REASON FOR CONSULT: Streptococcus bacteremia She is a poor historian.  Chart reviewed. Todd Brown is a 62 y.o. with a history of ESRD on dilaysis, HTN, recent strep gallolyticus bacteremia, schizhophrenia preses witl altered mental staus and fever from group home EMS note states that when they arrived at [facility].  Wife stated that the patient went to dialysis and got back at 4 PM.  He was not acting himself.  He would not eat.  Staff stated that the blood pressure was very high.  Patient has history of COPD and is chronically on 2 L of oxygen.  He was alert and oriented x 4 but was slow to respond as per EMS note.  He had a temperature of 102.5.  Sats was 76% on 2 L.  Rhonchi was heard in both lung fields.  He was placed on 15 L nonrebreather and given DuoNeb by nebulizer.  Blood pressure was very high at 183/102.  Patient was brought to [facility] ED.  In the ED vitals were  09/14/22 19:09  BP 154/86 !  Temp 101.9 F (38.8 C) !  Pulse Rate 107 !  Resp 23 !  SpO2 89 % (L)  O2 Flow Rate (L/min) 4 L/min    Latest Reference Range & Units 09/14/22 19:10  WBC 4.0 - 10.5 K/uL 12.1 (H)  Hemoglobin 13.0 - 17.0 g/dL 78.4  HCT 69.6 - 29.5 % 40.0  Platelets 150 - 400 K/uL 183  Creatinine 0.61 - 1.24 mg/dL 2.84 (H)   Blood culture was sent.  He was started on broad-spectrum antibiotics. Chest x-ray revealed cardiomegaly and congestive heart failure   He was in the hospital in jan 2024 with strep gallolyticus bacteremia and hemodialysis catheter was removed and tip was positive, TEE and colonoscopy were recommended by the covering infectious disease physician  and he refused so he was treated with 4 weeks of appropriate antibiotic during dialysis.  CT chest abdomen and pelvis revealed mild pulmonary edema with bilateral trace pleural  effusion. And Past Medical History:  Diagnosis Date   Anemia    Arthritis    Chronic respiratory failure    COPD (chronic obstructive pulmonary disease)    Dialysis patient    Mon. -Wed.- Fri   DVT (deep venous thrombosis)    cephalic and basolic vein thrombosis   ESRD (end stage renal disease)    ESRD (end stage renal disease) on dialysis    Hyperlipidemia    Hypertension    Malignant hypertension    Pneumonia 2016   Renal artery stenosis    Schizophrenia     Past Surgical History:  Procedure Laterality Date   A/V FISTULAGRAM N/A 08/06/2016   Procedure: A/V Fistulagram;  Surgeon: Annice Needy, MD;  Location: [facility] INVASIVE CV LAB;  Service: Cardiovascular;  Laterality: N/A;   A/V FISTULAGRAM N/A 02/14/2021   Procedure: A/V FISTULAGRAM poss Perm Cath Insertion;  Surgeon: Renford Dills, MD;  Location: [facility] INVASIVE CV LAB;  Service: Cardiovascular;  Laterality: N/A;   A/V FISTULAGRAM Left 08/26/2021   Procedure: A/V Fistulagram;  Surgeon: Annice Needy, MD;  Location: [facility] INVASIVE CV LAB;  Service: Cardiovascular;  Laterality: Left;   A/V SHUNT INTERVENTION N/A 08/06/2016   Procedure: A/V Shunt Intervention;  Surgeon: Annice Needy, MD;  Location: [facility] INVASIVE CV LAB;  Service: Cardiovascular;  Laterality: N/A;  AV FISTULA PLACEMENT Left 12/26/2014   Procedure: ARTERIOVENOUS (AV) FISTULA CREATION;  Surgeon: Annice Needy, MD;  Location: [facility] ORS;  Service: Vascular;  Laterality: Left;   AV FISTULA PLACEMENT     ESOPHAGOGASTRODUODENOSCOPY (EGD) WITH PROPOFOL N/A 05/06/2017   Procedure: ESOPHAGOGASTRODUODENOSCOPY (EGD) WITH PROPOFOL;  Surgeon: Wyline Mood, MD;  Location: [facility] ENDOSCOPY;  Service: Gastroenterology;  Laterality: N/A;   INSERTION OF DIALYSIS CATHETER Right    PERIPHERAL VASCULAR CATHETERIZATION N/A 10/22/2014   Procedure: Dialysis/Perma Catheter Insertion;  Surgeon: Annice Needy, MD;  Location: [facility] INVASIVE CV LAB;  Service: Cardiovascular;  Laterality: N/A;    PERIPHERAL VASCULAR CATHETERIZATION N/A 02/28/2015   Procedure: Dialysis/Perma Catheter Removal;  Surgeon: Annice Needy, MD;  Location: [facility] INVASIVE CV LAB;  Service: Cardiovascular;  Laterality: N/A;   Repair fx left lower leg     yrs ago (age 38)   REVISON OF ARTERIOVENOUS FISTULA Left 03/27/2022   Procedure: REVISON OF ARTERIOVENOUS FISTULA (JUMP GRAFT);  Surgeon: Renford Dills, MD;  Location: [facility] ORS;  Service: Vascular;  Laterality: Left;   TONSILLECTOMY      Social History   Socioeconomic History   Marital status: Single    Spouse name: Not on file   Number of children: 0   Years of education: Not on file   Highest education level: Not on file  Occupational History   Occupation: Disability   Tobacco Use   Smoking status: Never    Passive exposure: Never   Smokeless tobacco: Never  Vaping Use   Vaping Use: Never used  Substance and Sexual Activity   Alcohol use: Not Currently   Drug use: Not Currently    Types: Marijuana    Comment: not since living at assisted living place 03/2020   Sexual activity: Not Currently  Other Topics Concern   Not on file  Social History Narrative   ** Merged History Encounter **       Living at [facility] [place], [place] [employer] is legal guardian    Social Determinants of Health   Financial Resource Strain: Low Risk  (10/08/2018)   Overall Financial Resource Strain (CARDIA)    Difficulty of Paying Living Expenses: Not hard at all  Food Insecurity: No Food Insecurity (10/08/2018)   Hunger Vital Sign    Worried About Running Out of Food in the Last Year: Never true    Ran Out of Food in the Last Year: Never true  Transportation Needs: No Transportation Needs (10/08/2018)   PRAPARE - Administrator, Civil Service (Medical): No    Lack of Transportation (Non-Medical): No  Physical Activity: Unknown (10/08/2018)   Exercise Vital Sign    Days of Exercise per Week: Patient declined     Minutes of Exercise per Session: Patient declined  Stress: No Stress Concern Present (10/08/2018)   [employer] of Occupational Health - Occupational Stress Questionnaire    Feeling of Stress : Not at all  Social Connections: Unknown (10/08/2018)   Social Connection and Isolation Panel [NHANES]    Frequency of Communication with Friends and Family: Patient declined    Frequency of Social Gatherings with Friends and Family: Patient declined    Attends Religious Services: Patient declined    Database administrator or Organizations: Patient declined    Attends Banker Meetings: Patient declined    Marital Status: Patient declined  Intimate Partner Violence: Unknown (10/08/2018)   Humiliation, Afraid, Rape, and Kick questionnaire  Fear of Current or Ex-Partner: Patient declined    Emotionally Abused: Patient declined    Physically Abused: Patient declined    Sexually Abused: Patient declined    Family History  Problem Relation Age of Onset   Kidney disease Brother    Diabetes Neg Hx    Allergies  Allergen Reactions   Chlorpromazine Other (See Comments)    Reaction:  Unknown , pt states it makes him feel real bad Reaction:  Unknown , pt states it makes him feel real bad    I? Current Facility-Administered Medications  Medication Dose Route Frequency Provider Last Rate Last Admin   acetaminophen (TYLENOL) tablet 650 mg  650 mg Oral Q6H PRN Andris Baumann, MD       Or   acetaminophen (TYLENOL) suppository 650 mg  650 mg Rectal Q6H PRN Andris Baumann, MD       calcium acetate (PHOSLO) capsule 1,334 mg  1,334 mg Oral TID WC Andris Baumann, MD   1,334 mg at 09/15/22 0806   carbamazepine (TEGRETOL) tablet 1,000 mg  1,000 mg Oral q morning Lindajo Royal V, MD   1,000 mg at 09/15/22 0931   cefTRIAXone (ROCEPHIN) 2 g in sodium chloride 0.9 % 100 mL IVPB  2 g Intravenous Q24H Lucile Shutters, MD   Stopped at 09/15/22 0836   Chlorhexidine Gluconate Cloth 2 % PADS 6 each  6  each Topical Q0600 Wendee Beavers, NP       fluPHENAZine (PROLIXIN) tablet 2.5 mg  2.5 mg Oral Daily Lindajo Royal V, MD   2.5 mg at 09/15/22 0932   heparin injection 5,000 Units  5,000 Units Subcutaneous Q8H Andris Baumann, MD   5,000 Units at 09/15/22 0801   HYDROcodone-acetaminophen (NORCO/VICODIN) 5-325 MG per tablet 1-2 tablet  1-2 tablet Oral Q4H PRN Andris Baumann, MD       levothyroxine (SYNTHROID) tablet 50 mcg  50 mcg Oral Q0600 Andris Baumann, MD   50 mcg at 09/15/22 0806   morphine (PF) 2 MG/ML injection 2 mg  2 mg Intravenous Q2H PRN Andris Baumann, MD       ondansetron [facility]) tablet 4 mg  4 mg Oral Q6H PRN Andris Baumann, MD       Or   ondansetron [facility]) injection 4 mg  4 mg Intravenous Q6H PRN Andris Baumann, MD       pantoprazole (PROTONIX) EC tablet 40 mg  40 mg Oral Daily Lindajo Royal V, MD   40 mg at 09/15/22 0931   trihexyphenidyl (ARTANE) tablet 2 mg  2 mg Oral BID Andris Baumann, MD   2 mg at 09/15/22 1610   Current Outpatient Medications  Medication Sig Dispense Refill   albuterol (VENTOLIN HFA) 108 (90 Base) MCG/ACT inhaler Inhale 1-2 puffs into the lungs every 4 (four) hours as needed for wheezing or shortness of breath.     amLODipine (NORVASC) 10 MG tablet Take 10 mg by mouth every morning.     b complex-vitamin c-folic acid (NEPHRO-VITE) 0.8 MG TABS tablet Take 1 tablet by mouth at bedtime.     calcium acetate (PHOSLO) 667 MG capsule Take 1,334 mg by mouth 3 (three) times daily with meals.     carbamazepine (TEGRETOL) 200 MG tablet Take 1,000 mg by mouth every morning.     cholecalciferol (VITAMIN D) 1000 UNITS tablet Take 1,000 Units by mouth in the morning.     cloNIDine (CATAPRES - DOSED IN MG/24 HR) 0.2  mg/24hr patch Place 1 patch (0.2 mg total) onto the skin once a week. 4 patch 0   doxazosin (CARDURA) 4 MG tablet Take 1 tablet (4 mg total) by mouth at bedtime. 30 tablet 0   ferrous sulfate 325 (65 FE) MG tablet Take 1 tablet (325 mg total) by  mouth daily. 30 tablet 3   fluPHENAZine (PROLIXIN) 2.5 MG tablet Take 2.5 mg by mouth in the morning.     Fluticasone-Salmeterol (ADVAIR) 250-50 MCG/DOSE AEPB Inhale 1 puff into the lungs 2 (two) times daily.     hydrALAZINE (APRESOLINE) 25 MG tablet Take 50 mg by mouth 3 (three) times daily.     isosorbide mononitrate (IMDUR) 60 MG 24 hr tablet Take 60 mg by mouth 2 (two) times daily.     levothyroxine (SYNTHROID) 50 MCG tablet Take 1 tablet (50 mcg total) by mouth daily at 6 (six) AM. 30 tablet 0   minoxidil (LONITEN) 2.5 MG tablet Take 1 tablet (2.5 mg total) by mouth daily. 30 tablet 0   omeprazole (PRILOSEC) 40 MG capsule Take 40 mg by mouth every morning.     polyethylene glycol (MIRALAX / GLYCOLAX) 17 g packet Take 17 g by mouth daily as needed for mild constipation.     promethazine-dextromethorphan (PROMETHAZINE-DM) 6.25-15 MG/5ML syrup Take 5 mLs by mouth every 6 (six) hours as needed for cough.     trihexyphenidyl (ARTANE) 2 MG tablet Take 2 mg by mouth 2 (two) times daily.     zinc sulfate 220 (50 Zn) MG capsule Take 220 mg by mouth daily.       Abtx:  Anti-infectives (From admission, onward)    Start     Dose/Rate Route Frequency Ordered Stop   09/16/22 2200  vancomycin (VANCOREADY) IVPB 750 mg/150 mL  Status:  Discontinued        750 mg 150 mL/hr over 60 Minutes Intravenous Every M-W-F (Hemodialysis) 09/15/22 0031 09/15/22 1329   09/15/22 1000  ceFEPIme (MAXIPIME) 2 g in sodium chloride 0.9 % 100 mL IVPB  Status:  Discontinued        2 g 200 mL/hr over 30 Minutes Intravenous Every 12 hours 09/15/22 0031 09/15/22 0734   09/15/22 0800  metroNIDAZOLE (FLAGYL) IVPB 500 mg  Status:  Discontinued        500 mg 100 mL/hr over 60 Minutes Intravenous Every 12 hours 09/14/22 2248 09/15/22 1329   09/15/22 0800  cefTRIAXone (ROCEPHIN) 2 g in sodium chloride 0.9 % 100 mL IVPB        2 g 200 mL/hr over 30 Minutes Intravenous Every 24 hours 09/15/22 0734     09/15/22 0000  vancomycin  (VANCOREADY) IVPB 750 mg/150 mL        750 mg 150 mL/hr over 60 Minutes Intravenous  Once 09/14/22 2258 09/15/22 0200   09/14/22 1915  ceFEPIme (MAXIPIME) 2 g in sodium chloride 0.9 % 100 mL IVPB        2 g 200 mL/hr over 30 Minutes Intravenous  Once 09/14/22 1909 09/14/22 1949   09/14/22 1915  metroNIDAZOLE (FLAGYL) IVPB 500 mg        500 mg 100 mL/hr over 60 Minutes Intravenous  Once 09/14/22 1909 09/14/22 2052   09/14/22 1915  vancomycin (VANCOCIN) IVPB 1000 mg/200 mL premix        1,000 mg 200 mL/hr over 60 Minutes Intravenous  Once 09/14/22 1909 09/14/22 2210       REVIEW OF SYSTEMS:  Will to get a good  review of system from the patient He keeps repeating the same thing that he is 62 years old He did not focus and was looking elsewhere when I asked him about his vision he said he was blind Objective:  VITALS:  BP (!) 146/84 (BP Location: Right Arm)   Pulse 64   Temp 98.5 F (36.9 C) (Oral)   Resp 12   Ht 5\' 11"  (1.803 m)   Wt 79.4 kg   SpO2 100%   BMI 24.41 kg/m   PHYSICAL EXAM:  General: Awake, oriented to person Head: Normocephalic, without obvious abnormality, atraumatic. Eyes: Conjunctivae clear, anicteric sclerae. Pupils are equal Oral cavity Edentulous Neck , symmetrical, no adenopathy, thyroid: non tender no carotid bruit and no JVD.  Lungs: Bilateral air entry Decreased bases Heart: S1-S2 Abdomen: Soft, non-tender,not distended. Bowel sounds normal. No masses Extremities: Left AV graft present Skin: Very dry and scaly.  But it is a limited examination Lymph: Cervical, supraclavicular normal. Neurologic: Cannot assess Pertinent Labs Lab Results CBC    Component Value Date/Time   WBC 12.9 (H) 09/15/2022 0752   RBC 3.95 (L) 09/15/2022 0752   HGB 13.3 09/15/2022 0752   HCT 40.2 09/15/2022 0752   PLT 186 09/15/2022 0752   MCV 101.8 (H) 09/15/2022 0752   MCH 33.7 09/15/2022 0752   MCHC 33.1 09/15/2022 0752   RDW 14.5 09/15/2022 0752   LYMPHSABS  0.2 (L) 09/14/2022 1910   MONOABS 0.9 09/14/2022 1910   EOSABS 0.0 09/14/2022 1910   BASOSABS 0.0 09/14/2022 1910       Latest Ref Rng & Units 09/15/2022   11:23 AM 09/14/2022    7:10 PM 06/30/2022    2:58 AM  CMP  Glucose 70 - 99 mg/dL 98  80  161   BUN 8 - 23 mg/dL 44  34  39   Creatinine 0.61 - 1.24 mg/dL 0.96  0.45  4.09   Sodium 135 - 145 mmol/L 133  138  130   Potassium 3.5 - 5.1 mmol/L 4.7  4.5  4.3   Chloride 98 - 111 mmol/L 94  95  93   CO2 22 - 32 mmol/L 27  29  30    Calcium 8.9 - 10.3 mg/dL 9.1  9.2  8.7   Total Protein 6.5 - 8.1 g/dL  7.3  5.8   Total Bilirubin 0.3 - 1.2 mg/dL  1.0  0.7   Alkaline Phos 38 - 126 U/L  102  86   AST 15 - 41 U/L  31  21   ALT 0 - 44 U/L  17  <5       Microbiology: Recent Results (from the past 240 hour(s))  Resp panel by RT-PCR (RSV, Flu A&B, Covid) Anterior Nasal Swab     Status: None   Collection Time: 09/14/22  7:10 PM   Specimen: Anterior Nasal Swab  Result Value Ref Range Status   SARS Coronavirus 2 by RT PCR NEGATIVE NEGATIVE Final    Comment: (NOTE) SARS-CoV-2 target nucleic acids are NOT DETECTED.  The SARS-CoV-2 RNA is generally detectable in upper respiratory specimens during the acute phase of infection. The lowest concentration of SARS-CoV-2 viral copies this assay can detect is 138 copies/mL. A negative result does not preclude SARS-Cov-2 infection and should not be used as the sole basis for treatment or other patient management decisions. A negative result may occur with  improper specimen collection/handling, submission of specimen other than nasopharyngeal swab, presence of viral mutation(s) within the areas  targeted by this assay, and inadequate number of viral copies(<138 copies/mL). A negative result must be combined with clinical observations, patient history, and epidemiological information. The expected result is Negative.  Fact Sheet for Patients:  BloggerCourse.com  Fact  Sheet for Healthcare Providers:  SeriousBroker.it  This test is no t yet approved or cleared by the [place] FDA and  has been authorized for detection and/or diagnosis of SARS-CoV-2 by FDA under an Emergency Use Authorization (EUA). This EUA will remain  in effect (meaning this test can be used) for the duration of the COVID-19 declaration under Section 564(b)(1) of the Act, 21 U.S.C.section 360bbb-3(b)(1), unless the authorization is terminated  or revoked sooner.       Influenza A by PCR NEGATIVE NEGATIVE Final   Influenza B by PCR NEGATIVE NEGATIVE Final    Comment: (NOTE) The Xpert Xpress SARS-CoV-2/FLU/RSV plus assay is intended as an aid in the diagnosis of influenza from Nasopharyngeal swab specimens and should not be used as a sole basis for treatment. Nasal washings and aspirates are unacceptable for Xpert Xpress SARS-CoV-2/FLU/RSV testing.  Fact Sheet for Patients: BloggerCourse.com  Fact Sheet for Healthcare Providers: SeriousBroker.it  This test is not yet approved or cleared by the [place] FDA and has been authorized for detection and/or diagnosis of SARS-CoV-2 by FDA under an Emergency Use Authorization (EUA). This EUA will remain in effect (meaning this test can be used) for the duration of the COVID-19 declaration under Section 564(b)(1) of the Act, 21 U.S.C. section 360bbb-3(b)(1), unless the authorization is terminated or revoked.     Resp Syncytial Virus by PCR NEGATIVE NEGATIVE Final    Comment: (NOTE) Fact Sheet for Patients: BloggerCourse.com  Fact Sheet for Healthcare Providers: SeriousBroker.it  This test is not yet approved or cleared by the [place] FDA and has been authorized for detection and/or diagnosis of SARS-CoV-2 by FDA under an Emergency Use Authorization (EUA). This EUA will remain in effect  (meaning this test can be used) for the duration of the COVID-19 declaration under Section 564(b)(1) of the Act, 21 U.S.C. section 360bbb-3(b)(1), unless the authorization is terminated or revoked.  Performed at [facility], [address] [place], [place] [address]   Blood Culture (routine x 2)     Status: None (Preliminary result)   Collection Time: 09/14/22  7:10 PM   Specimen: BLOOD  Result Value Ref Range Status   Specimen Description   Final    BLOOD BLOOD RIGHT WRIST Performed at [facility], [address], [place], [place] [address]    Special Requests   Final    BOTTLES DRAWN AEROBIC AND ANAEROBIC Blood Culture results may not be optimal due to an inadequate volume of blood received in culture bottles Performed at [facility], [address], [place], [place] [address]    Culture  Setup Time   Final    GRAM POSITIVE COCCI IN BOTH AEROBIC AND ANAEROBIC BOTTLES CRITICAL RESULT CALLED TO, READ BACK BY AND VERIFIED WITH: Albertine Grates 09/15/22 0715 MW Performed at [facility], [address] [place], [place] [address]    Culture [facility] POSITIVE COCCI  Final   Report Status PENDING  Incomplete  Blood Culture ID Panel (Reflexed)     Status: Abnormal   Collection Time: 09/14/22  7:10 PM  Result Value Ref Range Status   Enterococcus faecalis NOT DETECTED NOT DETECTED Final   Enterococcus Faecium NOT DETECTED NOT DETECTED Final   Listeria monocytogenes NOT DETECTED  NOT DETECTED Final   Staphylococcus species NOT DETECTED NOT DETECTED Final   Staphylococcus aureus (BCID) NOT DETECTED NOT DETECTED Final   Staphylococcus epidermidis NOT DETECTED NOT DETECTED Final   Staphylococcus lugdunensis NOT DETECTED NOT DETECTED Final   Streptococcus species DETECTED (A) NOT DETECTED Final    Comment: Not Enterococcus species, Streptococcus agalactiae, Streptococcus pyogenes, or Streptococcus pneumoniae. CRITICAL RESULT CALLED TO, READ BACK BY AND  VERIFIED WITH: TRAY GREENWOOD 09/15/22 0715 MW    Streptococcus agalactiae NOT DETECTED NOT DETECTED Final   Streptococcus pneumoniae NOT DETECTED NOT DETECTED Final   Streptococcus pyogenes NOT DETECTED NOT DETECTED Final   A.calcoaceticus-baumannii NOT DETECTED NOT DETECTED Final   Bacteroides fragilis NOT DETECTED NOT DETECTED Final   Enterobacterales NOT DETECTED NOT DETECTED Final   Enterobacter cloacae complex NOT DETECTED NOT DETECTED Final   Escherichia coli NOT DETECTED NOT DETECTED Final   Klebsiella aerogenes NOT DETECTED NOT DETECTED Final   Klebsiella oxytoca NOT DETECTED NOT DETECTED Final   Klebsiella pneumoniae NOT DETECTED NOT DETECTED Final   Proteus species NOT DETECTED NOT DETECTED Final   Salmonella species NOT DETECTED NOT DETECTED Final   Serratia marcescens NOT DETECTED NOT DETECTED Final   Haemophilus influenzae NOT DETECTED NOT DETECTED Final   Neisseria meningitidis NOT DETECTED NOT DETECTED Final   Pseudomonas aeruginosa NOT DETECTED NOT DETECTED Final   Stenotrophomonas maltophilia NOT DETECTED NOT DETECTED Final   Candida albicans NOT DETECTED NOT DETECTED Final   Candida auris NOT DETECTED NOT DETECTED Final   Candida glabrata NOT DETECTED NOT DETECTED Final   Candida krusei NOT DETECTED NOT DETECTED Final   Candida parapsilosis NOT DETECTED NOT DETECTED Final   Candida tropicalis NOT DETECTED NOT DETECTED Final   Cryptococcus neoformans/gattii NOT DETECTED NOT DETECTED Final    Comment: Performed at [facility], [address] [place], [place] [address]  Blood Culture (routine x 2)     Status: None (Preliminary result)   Collection Time: 09/14/22  7:15 PM   Specimen: BLOOD  Result Value Ref Range Status   Specimen Description   Final    BLOOD RIGHT ANTECUBITAL Performed at [facility], [address] [place], [place] [address]    Special Requests   Final    BOTTLES DRAWN AEROBIC AND ANAEROBIC Blood Culture results may not be  optimal due to an inadequate volume of blood received in culture bottles Performed at [facility], [address] [place], [place] [address]    Culture  Setup Time   Final    GRAM POSITIVE COCCI IN BOTH AEROBIC AND ANAEROBIC BOTTLES CRITICAL RESULT CALLED TO, READ BACK BY AND VERIFIED WITH: TRAY GREENWOOD 09/15/22 0715 MW CRITICAL VALUE NOTED.  VALUE IS CONSISTENT WITH PREVIOUSLY REPORTED AND CALLED VALUE. Performed at [facility], 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  He refused TEE at that time.  He was also recommended colonoscopy as this organism is associated with colon cancer.  But I do not think it was done So we need to get a TEE this admission Will also need colonoscopy Also need to look into the AV  graft chest pain all functioning.  May do a limited WBC scan to look at the graft Patient is currently on vancomycin and ceftriaxone.  Can discontinue vancomycin  End-stage renal disease on dialysis  Hypertension:   Schizophrenia On fluphenazine Also on Tegretol and trihexyphenidyl  Hypothyroidism on levothyroxine  Blindness both eyes ___________________________________________________ Will discuss with care team. Note:  This document was prepared using Dragon voice recognition software and may include unintentional dictation errors.

## 2022-09-15 NOTE — Progress Notes (Signed)
[facility]  ROUNDING NOTE   Subjective:   Todd Brown is a 62 year old male with past medical conditions including COPD, hypertension, DVT, schizophrenia, and end-stage renal disease on hemodialysis.  Patient presents to the emergency department with lethargy.   Patient is known to our practice and receives outpatient dialysis treatments at [facility] on a MWF schedule, supervised by Dr. Cherylann Ratel.  It appears patient received a partial dialysis treatment yesterday at outpatient clinic and left treatment almost 3 kg above dry weight.  Patient presents with shortness of breath and cough.  No edema noted in lower extremities.Currently on 8L [place]  Labs on ED arrival unremarkable for renal patient.  Troponin 104.  Respiratory panel negative for influenza, COVID-19, and RSV.  Blood cultures positive for Streptococcus.  Chest x-ray shows small pleural effusions and questionable pulmonary edema.  CT abdomen pelvis significant for volume overload, mild pulmonary edema and a small volume pericardial effusion.  We have been consulted to provide dialysis.   Objective:  Vital signs in last 24 hours:  Temp:  [98.1 F (36.7 C)-104.2 F (40.1 C)] 98.5 F (36.9 C) (04/16 1104) Pulse Rate:  [69-107] 75 (04/16 1300) Resp:  [16-25] 17 (04/16 1300) BP: (90-159)/(68-99) 117/77 (04/16 1300) SpO2:  [89 %-100 %] 100 % (04/16 1300) Weight:  [79.4 kg] 79.4 kg (04/15 1907)  Weight change:  Filed Weights   09/14/22 1907  Weight: 79.4 kg    Intake/Output: I/O last 3 completed shifts: In: 550 [IV Piggyback:550] Out: -    Intake/Output this shift:  Total I/O In: 740 [P.O.:540; IV Piggyback:200] Out: -   Physical Exam: General: NAD  Head: Normocephalic, atraumatic. Moist oral mucosal membranes  Eyes: Anicteric  Lungs:  Basilar rales, normal effort, [place] O2  Heart: Regular rate and rhythm  Abdomen:  Soft, nontender  Extremities:  No peripheral edema.  Neurologic: Alert, moving  all four extremities  Skin: No lesions  Access: Lt avf    Basic Metabolic Panel: Recent Labs  Lab 09/14/22 1910 09/15/22 1123  NA 138 133*  K 4.5 4.7  CL 95* 94*  CO2 29 27  GLUCOSE 80 98  BUN 34* 44*  CREATININE 6.35* 7.30*  CALCIUM 9.2 9.1  PHOS  --  3.7    Liver Function Tests: Recent Labs  Lab 09/14/22 1910 09/15/22 1123  AST 31  --   ALT 17  --   ALKPHOS 102  --   BILITOT 1.0  --   PROT 7.3  --   ALBUMIN 3.7 2.9*   No results for input(s): "LIPASE", "AMYLASE" in the last 168 hours. No results for input(s): "AMMONIA" in the last 168 hours.  CBC: Recent Labs  Lab 09/14/22 1910 09/15/22 0752  WBC 12.1* 12.9*  NEUTROABS 10.9*  --   HGB 13.2 13.3  HCT 40.0 40.2  MCV 101.8* 101.8*  PLT 183 186    Cardiac Enzymes: No results for input(s): "CKTOTAL", "CKMB", "CKMBINDEX", "TROPONINI" in the last 168 hours.  BNP: Invalid input(s): "POCBNP"  CBG: No results for input(s): "GLUCAP" in the last 168 hours.  Microbiology: Results for orders placed or performed during the hospital encounter of 09/14/22  Resp panel by RT-PCR (RSV, Flu A&B, Covid) Anterior Nasal Swab     Status: None   Collection Time: 09/14/22  7:10 PM   Specimen: Anterior Nasal Swab  Result Value Ref Range Status   SARS Coronavirus 2 by RT PCR NEGATIVE NEGATIVE Final    Comment: (NOTE) SARS-CoV-2 target nucleic  acids are NOT DETECTED.  The SARS-CoV-2 RNA is generally detectable in upper respiratory specimens during the acute phase of infection. The lowest concentration of SARS-CoV-2 viral copies this assay can detect is 138 copies/mL. A negative result does not preclude SARS-Cov-2 infection and should not be used as the sole basis for treatment or other patient management decisions. A negative result may occur with  improper specimen collection/handling, submission of specimen other than nasopharyngeal swab, presence of viral mutation(s) within the areas targeted by this assay, and  inadequate number of viral copies(<138 copies/mL). A negative result must be combined with clinical observations, patient history, and epidemiological information. The expected result is Negative.  Fact Sheet for Patients:  BloggerCourse.com  Fact Sheet for Healthcare Providers:  SeriousBroker.it  This test is no t yet approved or cleared by the [place] FDA and  has been authorized for detection and/or diagnosis of SARS-CoV-2 by FDA under an Emergency Use Authorization (EUA). This EUA will remain  in effect (meaning this test can be used) for the duration of the COVID-19 declaration under Section 564(b)(1) of the Act, 21 U.S.C.section 360bbb-3(b)(1), unless the authorization is terminated  or revoked sooner.       Influenza A by PCR NEGATIVE NEGATIVE Final   Influenza B by PCR NEGATIVE NEGATIVE Final    Comment: (NOTE) The Xpert Xpress SARS-CoV-2/FLU/RSV plus assay is intended as an aid in the diagnosis of influenza from Nasopharyngeal swab specimens and should not be used as a sole basis for treatment. Nasal washings and aspirates are unacceptable for Xpert Xpress SARS-CoV-2/FLU/RSV testing.  Fact Sheet for Patients: BloggerCourse.com  Fact Sheet for Healthcare Providers: SeriousBroker.it  This test is not yet approved or cleared by the [place] FDA and has been authorized for detection and/or diagnosis of SARS-CoV-2 by FDA under an Emergency Use Authorization (EUA). This EUA will remain in effect (meaning this test can be used) for the duration of the COVID-19 declaration under Section 564(b)(1) of the Act, 21 U.S.C. section 360bbb-3(b)(1), unless the authorization is terminated or revoked.     Resp Syncytial Virus by PCR NEGATIVE NEGATIVE Final    Comment: (NOTE) Fact Sheet for Patients: BloggerCourse.com  Fact Sheet for Healthcare  Providers: SeriousBroker.it  This test is not yet approved or cleared by the [place] FDA and has been authorized for detection and/or diagnosis of SARS-CoV-2 by FDA under an Emergency Use Authorization (EUA). This EUA will remain in effect (meaning this test can be used) for the duration of the COVID-19 declaration under Section 564(b)(1) of the Act, 21 U.S.C. section 360bbb-3(b)(1), unless the authorization is terminated or revoked.  Performed at [facility], [address] [place], [place] [address]   Blood Culture (routine x 2)     Status: None (Preliminary result)   Collection Time: 09/14/22  7:10 PM   Specimen: BLOOD  Result Value Ref Range Status   Specimen Description   Final    BLOOD BLOOD RIGHT WRIST Performed at [facility], [address], [place], [place] [address]    Special Requests   Final    BOTTLES DRAWN AEROBIC AND ANAEROBIC Blood Culture results may not be optimal due to an inadequate volume of blood received in culture bottles Performed at [facility], [address] [place], [place] [address]    Culture  Setup Time   Final    GRAM POSITIVE COCCI IN BOTH AEROBIC AND ANAEROBIC BOTTLES CRITICAL RESULT CALLED TO, READ BACK BY AND VERIFIED WITH: TRAY  GREENWOOD 09/15/22 0715 MW Performed at [facility], [address], [place], [place] [address]    Culture GRAM POSITIVE COCCI  Final   Report Status PENDING  Incomplete  Blood Culture ID Panel (Reflexed)     Status: Abnormal   Collection Time: 09/14/22  7:10 PM  Result Value Ref Range Status   Enterococcus faecalis NOT DETECTED NOT DETECTED Final   Enterococcus Faecium NOT DETECTED NOT DETECTED Final   Listeria monocytogenes NOT DETECTED NOT DETECTED Final   Staphylococcus species NOT DETECTED NOT DETECTED Final   Staphylococcus aureus (BCID) NOT DETECTED NOT DETECTED Final   Staphylococcus epidermidis NOT DETECTED NOT DETECTED Final    Staphylococcus lugdunensis NOT DETECTED NOT DETECTED Final   Streptococcus species DETECTED (A) NOT DETECTED Final    Comment: Not Enterococcus species, Streptococcus agalactiae, Streptococcus pyogenes, or Streptococcus pneumoniae. CRITICAL RESULT CALLED TO, READ BACK BY AND VERIFIED WITH: TRAY GREENWOOD 09/15/22 0715 MW    Streptococcus agalactiae NOT DETECTED NOT DETECTED Final   Streptococcus pneumoniae NOT DETECTED NOT DETECTED Final   Streptococcus pyogenes NOT DETECTED NOT DETECTED Final   A.calcoaceticus-baumannii NOT DETECTED NOT DETECTED Final   Bacteroides fragilis NOT DETECTED NOT DETECTED Final   Enterobacterales NOT DETECTED NOT DETECTED Final   Enterobacter cloacae complex NOT DETECTED NOT DETECTED Final   Escherichia coli NOT DETECTED NOT DETECTED Final   Klebsiella aerogenes NOT DETECTED NOT DETECTED Final   Klebsiella oxytoca NOT DETECTED NOT DETECTED Final   Klebsiella pneumoniae NOT DETECTED NOT DETECTED Final   Proteus species NOT DETECTED NOT DETECTED Final   Salmonella species NOT DETECTED NOT DETECTED Final   Serratia marcescens NOT DETECTED NOT DETECTED Final   Haemophilus influenzae NOT DETECTED NOT DETECTED Final   Neisseria meningitidis NOT DETECTED NOT DETECTED Final   Pseudomonas aeruginosa NOT DETECTED NOT DETECTED Final   Stenotrophomonas maltophilia NOT DETECTED NOT DETECTED Final   Candida albicans NOT DETECTED NOT DETECTED Final   Candida auris NOT DETECTED NOT DETECTED Final   Candida glabrata NOT DETECTED NOT DETECTED Final   Candida krusei NOT DETECTED NOT DETECTED Final   Candida parapsilosis NOT DETECTED NOT DETECTED Final   Candida tropicalis NOT DETECTED NOT DETECTED Final   Cryptococcus neoformans/gattii NOT DETECTED NOT DETECTED Final    Comment: Performed at [facility], [address] [place], [place] [address]  Blood Culture (routine x 2)     Status: None (Preliminary result)   Collection Time: 09/14/22  7:15 PM   Specimen:  BLOOD  Result Value Ref Range Status   Specimen Description   Final    BLOOD RIGHT ANTECUBITAL Performed at [facility], [address] [place], [place] [address]    Special Requests   Final    BOTTLES DRAWN AEROBIC AND ANAEROBIC Blood Culture results may not be optimal due to an inadequate volume of blood received in culture bottles Performed at [facility], [address] [place], [place] [address]    Culture  Setup Time   Final    GRAM POSITIVE COCCI IN BOTH AEROBIC AND ANAEROBIC BOTTLES CRITICAL RESULT CALLED TO, READ BACK BY AND VERIFIED WITH: TRAY GREENWOOD 09/15/22 0715 MW CRITICAL VALUE NOTED.  VALUE IS CONSISTENT WITH PREVIOUSLY REPORTED AND CALLED VALUE. Performed at [facility], [address], [place], [place] [address]    Culture [facility] POSITIVE COCCI  Final   Report Status PENDING  Incomplete    Coagulation Studies: Recent Labs    09/14/22 1910  LABPROT  13.9  INR 1.1    Urinalysis: No results for input(s): "COLORURINE", "LABSPEC", "PHURINE", "GLUCOSEU", "HGBUR", "BILIRUBINUR", "KETONESUR", "PROTEINUR", "UROBILINOGEN", "NITRITE", "LEUKOCYTESUR" in the last 72 hours.  Invalid input(s): "APPERANCEUR"    Imaging: CT CHEST ABDOMEN PELVIS W CONTRAST  Result Date: 09/14/2022 CLINICAL DATA:  Sepsis. Pt with c/o lethargy after dialysis today. Pt received full tx at dialysis. Hx of COPD, wears 02 at 2L via [place]. EMS note pt to be 76% upon arrival to scene. EXAM: CT CHEST, ABDOMEN, AND PELVIS WITH CONTRAST TECHNIQUE: Multidetector CT imaging of the chest, abdomen and pelvis was performed following the standard protocol during bolus administration of intravenous contrast. RADIATION DOSE REDUCTION: This exam was performed according to the departmental dose-optimization program which includes automated exposure control, adjustment of the mA and/or kV according to patient size and/or use of iterative reconstruction technique. CONTRAST:  OMNIPAQUE  IOHEXOL 300 MG/ML  SOLN COMPARISON:  CT chest 06/19/2022 FINDINGS: CT CHEST FINDINGS Cardiovascular: Enlarged heart size. Small volume pericardial effusion. The thoracic aorta is normal in caliber. Mild atherosclerotic plaque of the thoracic aorta. Four vessel coronary artery calcifications. The main pulmonary artery is borderline enlarged measuring up to 3.1 cm. No central pulmonary embolus. Mediastinum/Nodes: No enlarged mediastinal, hilar, or axillary lymph nodes. Thyroid gland, trachea, and esophagus demonstrate no significant findings. Lungs/Pleura: Interlobular septal wall thickening. Bibasilar passive atelectasis. Lingular ground-glass airspace opacity likely atelectasis. No focal consolidation. No pulmonary nodule. No pulmonary mass. Bilateral trace, left greater than right, pleural effusions. No pneumothorax. Musculoskeletal: No chest wall abnormality.  Bilateral gynecomastia. No suspicious lytic or blastic osseous lesions. No acute displaced fracture. Old healed sternal fracture. CT ABDOMEN PELVIS FINDINGS Hepatobiliary: No focal liver abnormality. No gallstones, gallbladder wall thickening, or pericholecystic fluid. No biliary dilatation. Pancreas: No focal lesion. Normal pancreatic contour. No surrounding inflammatory changes. No main pancreatic ductal dilatation. Spleen: Normal in size without focal abnormality. Adrenals/Urinary Tract: Bilateral adrenal gland hyperplasia with a poorly visualized 2 cm left adrenal gland nodule demonstrating a density of 7 Hounsfield units-consistent with a lipid rich adenoma, no further follow-up indicated. Bilateral renal cortical scarring. Bilateral kidneys enhance symmetrically. Fluid density lesions likely represent simple renal cysts. Simple renal cysts, in the absence of clinically indicated signs/symptoms, require no independent follow-up. No hydronephrosis. No hydroureter. The urinary bladder is unremarkable. No excretion of intravenous contrast on delayed  imaging. Stomach/Bowel: Stomach is within normal limits. No evidence of bowel wall thickening or dilatation. Appendix appears normal. Vascular/Lymphatic: No abdominal aorta or iliac aneurysm. At least moderate atherosclerotic plaque of the aorta and its branches. No abdominal, pelvic, or inguinal lymphadenopathy. Reproductive: Prostate is unremarkable. Other: At least small volume simple free fluid within the abdomen pelvis. No intraperitoneal free gas. No organized fluid collection. Musculoskeletal: No abdominal wall hernia or abnormality. No suspicious lytic or blastic osseous lesions. No acute displaced fracture. IMPRESSION: 1. Findings of volume overload. 2. Mild pulmonary edema with bilateral trace, left greater than right, pleural effusions. Associated bilateral lower lobe and lingular atelectasis. Superimposed infection not excluded. 3. Cardiomegaly. 4. Small volume pericardial effusion. 5. At least small volume simple free fluid ascites. 6. Anasarca. 7. Aortic Atherosclerosis (ICD10-I70.0) including four-vessel coronary calcification. 8. No excretion of intravenous contrast on delayed imaging. Correlate with creatinine levels. Electronically Signed   By: Tish Frederickson M.D.   On: 09/14/2022 21:25   DG Chest Port 1 View  Result Date: 09/14/2022 CLINICAL DATA:  Questionable sepsis - evaluate for abnormality Lethargy after dialysis today. EXAM: PORTABLE CHEST 1 VIEW  COMPARISON:  Radiograph and CT 06/19/2022 FINDINGS: The previous right-sided dialysis catheter is been removed. Cardiomegaly is unchanged. Mediastinal contours are unchanged. Small bilateral pleural effusions which have increased from prior exam. Diffuse interstitial thickening may represent pulmonary edema superimposed on emphysema. No pneumothorax. IMPRESSION: Unchanged cardiomegaly. Small bilateral pleural effusions have increased from prior exam. Diffuse interstitial thickening may represent pulmonary edema superimposed on emphysema.  Overall findings suggest fluid overload/CHF. Electronically Signed   By: Narda Rutherford M.D.   On: 09/14/2022 19:43     Medications:    anticoagulant sodium citrate     cefTRIAXone (ROCEPHIN)  IV Stopped (09/15/22 0836)    calcium acetate  1,334 mg Oral TID WC   carbamazepine  1,000 mg Oral q morning   Chlorhexidine Gluconate Cloth  6 each Topical Q0600   fluPHENAZine  2.5 mg Oral Daily   heparin  5,000 Units Subcutaneous Q8H   levothyroxine  50 mcg Oral Q0600   pantoprazole  40 mg Oral Daily   trihexyphenidyl  2 mg Oral BID   acetaminophen **OR** acetaminophen, alteplase, anticoagulant sodium citrate, heparin, HYDROcodone-acetaminophen, lidocaine (PF), lidocaine-prilocaine, morphine injection, ondansetron **OR** ondansetron (ZOFRAN) IV, pentafluoroprop-tetrafluoroeth  Assessment/ Plan:  Mr. MISTY RAGO is a 62 y.o.  male is a 62 year old male with past medical conditions including COPD, hypertension, DVT, schizophrenia, and end-stage renal disease on hemodialysis.  Patient presents to the emergency department with lethargy  [facility]/MWF/left aVF   Fluid volume overload with end stage renal disease on hemodialysis. CT chest abd pelvis show fluid overload with mild pulmonary edema and pleural effusions.  Patient receiving dialysis, UF 2 L as tolerated.  Next treatment scheduled for Wednesday.  2. Anemia of chronic kidney disease Lab Results  Component Value Date   HGB 13.3 09/15/2022    Hemoglobin above desired range.  No need for ESA's at this time.  3. Secondary Hyperparathyroidism: with outpatient labs: PTH 459, phosphorus 3.4, calcium 9.7 on 06/08/22 .   Lab Results  Component Value Date   PTH 194 (H) 05/11/2017   CALCIUM 9.1 09/15/2022   CAION 1.19 03/27/2022   PHOS 3.7 09/15/2022    Calcium and phosphorus within optimal range.  Patient receives cholecalciferol and calcium acetate outpatient.  4.  Hypertension with chronic kidney disease.  Home regimen  includes amlodipine, clonidine, hydralazine, isosorbide, doxazosin and minoxidil.  All currently held at this time.  Blood pressure 109/75 during dialysis.   LOS: 1   4/16/20241:30 PM

## 2022-09-15 NOTE — ED Notes (Addendum)
RN went into room and noted that pts IV's are out. Bleeding was controlled and catheters intact. Breakfast brought to pt and within reach. Pt alert, but confused, does not know why he is in the hospital or where he is. Pt on cardiac, bp and pulse ox monitor.  Fistula has a positive thrill.

## 2022-09-15 NOTE — Progress Notes (Signed)
Progress Note   Patient: Todd Brown WNU:[id number] DOB: [birth year] DOA: 09/14/2022     1 DOS: the patient was seen and examined on 09/15/2022   Brief hospital course:   Todd Brown is a 62 y.o. 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  Patient was in his usual state of health earlier in today and went to and completed his dialysis session but on arrival home he was lethargic.  On arrival of EMS he was saturating in the 70s on room air and was placed on NRB for transport. ED course and data review: Tmax 104.2 with pulse 107 respirations 23 and O2 sat initially 89% on NRB improving to 94% on HFNC at 6 L.  BP was 154/86 on arrival.  VBG on HFNC showed pH 7.5 with pCO2 40.  Labs otherwise significant for WBC 12,100, lactic acid 0.9 and procalcitonin 0.72.  Respiratory viral panel negative for COVID flu and RSV.  Troponin 81. EKG, personally viewed and interpreted showing sinus tachycardia with no acute ST-T wave changes. CT chest abdomen and pelvis suggested volume overload cannot rule out superimposed infection as further detailed below: IMPRESSION: 1. Findings of volume overload. 2. Mild pulmonary edema with bilateral trace, left greater than right, pleural effusions. Associated bilateral lower lobe and lingular atelectasis. Superimposed infection not excluded. 3. Cardiomegaly. 4. Small volume pericardial effusion. 5. At least small volume simple free fluid ascites. 6. Anasarca.   Patient started on cefepime vancomycin and Flagyl for sepsis of unknown source.  Hospitalist consulted for admission.       Assessment and Plan:    Severe  sepsis  Community-acquired pneumonia  Streptococcus bacteremia.  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  Organ dysfunction of respiratory failure and  altered mental status. Pro-Calc was 0.72 but with normal lactic acid CT chest abdomen and pelvis consistent with anasarca.  Cannot rule out superimposed pulmonary infection Blood culture revealed strep species Will discontinue cefepime, vancomycin and Flagyl and place patient on Rocephin 2 g IV daily Consult ID Patient did not receive IV fluid resuscitation due to fluid overload Chart review showed that patient was admitted for bacteremia secondary to strep gallolyticus in 01/24. Suspected source was from the PermCath (PermCath was removed during that hospitalization). Standard ECHO was obtained which did not show any vegetation.  ID was consulted and they requested TEE and colonscopy, however, the pt did not agree to have these procedures. Pt was continued on IV cefazolin 2g MWF with dialysis for strep gallolyticus bacteremia (END DATE Jul 20 2022).         Acute on chronic respiratory failure with hypoxia O2 sat in the 70s with EMS, on NRB for transport, now on HFNC Etiology primarily acute on chronic diastolic CHF and CAP  Continue high flow nasal cannula to maintain sats over 92% and wean as tolerated Expect improvement in oxygenation following resolution of acute illness     Acute metabolic encephalopathy Secondary to sepsis from Streptococcus bacteremia Patient is oriented to person and place but not to time Monitor patient closely   Acute on chronic diastolic dysfunction CHF Anasarca  EF 06/2022 showing  EF 60 to 65% with grade 1 diastolic dysfunction CT with findings of volume overload, mild pulmonary edema with pleural effusions, small volume pericardial effusion and anasarca and small volume fluid  ascites Nephrology consult for management of fluid status      ESRD on dialysis Nephrology consult for renal replacement therapy    Essential hypertension Currently normotensive Will hold antihypertensives in view of severe sepsis diagnosis   COPD (chronic obstructive pulmonary disease) Continue home inhalers DuoNebs as needed   Schizophrenia Continue Prolixin and carbamazepine No acute concerns     DVT prophylaxis: heparin     Subjective: Patient is seen and examined at bedside.  Appears tachypneic and in mild to moderate respiratory distress  Physical Exam: Vitals:   09/15/22 1230 09/15/22 1300 09/15/22 1330 09/15/22 1400  BP: 98/71 117/77 117/85 109/75  Pulse: 69 75 65 65  Resp: Temp:      TempSrc:      SpO2: 100% 100% 100% 100%  Weight:      Height:       Constitutional:      General: He is not in acute distress.    Comments: Lethargic but arouses to verbal stimuli HENT:     Head: Normocephalic and atraumatic.  Cardiovascular:     Rate and Rhythm: Regular rhythm. Tachycardia present.     Heart sounds: Normal heart sounds.  Pulmonary:     Effort: Tachypneic    Breath sounds: Scattered rhonchi Abdominal:     Palpations: Abdomen is soft.     Tenderness: There is no abdominal tenderness.  Musculoskeletal:     Right lower leg: Edema present.     Left lower leg: Edema present.  Neurological:     Mental Status: Mental status is at baseline.   Data Reviewed: Labs reviewed.  Procalcitonin 6.52, white count 12.9, creatinine 7.30 consistent with known end-stage renal disease There are no new results to review at this time.  Family Communication: No family at the bedside  Disposition:  Patient class is not currently inpatient or observation. Update patient class prior to completing documentation.  Inpatient status  Planned Discharge Destination: Skilled nursing facility    Time spent: 38 minutes  Author: Lucile Shutters, MD 09/15/2022 2:31  PM  For on call review [email or web address].

## 2022-09-15 NOTE — Progress Notes (Signed)
Received patient in bed to unit. Very lethargic,    Informed consent signed and in chart.    TX duration:3.5     Transported By hospital transporter back to ED Hand-off given to patient's nurse, Jackquline Bosch   Access used: L AVF Access issues:None    Total UF removed:2000  Medication(s) given: Post HD VS: Stable  Post HD weight: Unable to weigh pt pre or post                Weight change 2kg  [facility]

## 2022-09-15 NOTE — ED Notes (Signed)
Report given to dialysis nurse, L. Gaynell Face

## 2022-09-15 NOTE — Consult Note (Signed)
PHARMACY - PHYSICIAN COMMUNICATION CRITICAL VALUE ALERT - BLOOD CULTURE IDENTIFICATION (BCID)  Todd Brown is an 62 y.o. male who presented to [facility] on 09/14/2022 with a chief complaint of acute on chronic respiratory failure  Assessment:  Blood cultures 4/4 GPC, BCID positive for strep species with no resistance genes.  Name of physician (or Provider) Contacted: Dr. Joylene Igo  Current antibiotics: cefepime and vancomycin  Changes to prescribed antibiotics recommended:  Change cefepime to ceftriaxone 2 grams IV every 24 hours  Results for orders placed or performed during the hospital encounter of 09/14/22  Blood Culture ID Panel (Reflexed) (Collected: 09/14/2022  7:10 PM)  Result Value Ref Range   Enterococcus faecalis NOT DETECTED NOT DETECTED   Enterococcus Faecium NOT DETECTED NOT DETECTED   Listeria monocytogenes NOT DETECTED NOT DETECTED   Staphylococcus species NOT DETECTED NOT DETECTED   Staphylococcus aureus (BCID) NOT DETECTED NOT DETECTED   Staphylococcus epidermidis NOT DETECTED NOT DETECTED   Staphylococcus lugdunensis NOT DETECTED NOT DETECTED   Streptococcus species DETECTED (A) NOT DETECTED   Streptococcus agalactiae NOT DETECTED NOT DETECTED   Streptococcus pneumoniae NOT DETECTED NOT DETECTED   Streptococcus pyogenes NOT DETECTED NOT DETECTED   A.calcoaceticus-baumannii NOT DETECTED NOT DETECTED   Bacteroides fragilis NOT DETECTED NOT DETECTED   Enterobacterales NOT DETECTED NOT DETECTED   Enterobacter cloacae complex NOT DETECTED NOT DETECTED   Escherichia coli NOT DETECTED NOT DETECTED   Klebsiella aerogenes NOT DETECTED NOT DETECTED   Klebsiella oxytoca NOT DETECTED NOT DETECTED   Klebsiella pneumoniae NOT DETECTED NOT DETECTED   Proteus species NOT DETECTED NOT DETECTED   Salmonella species NOT DETECTED NOT DETECTED   Serratia marcescens NOT DETECTED NOT DETECTED   Haemophilus influenzae NOT DETECTED NOT DETECTED   Neisseria meningitidis NOT  DETECTED NOT DETECTED   Pseudomonas aeruginosa NOT DETECTED NOT DETECTED   Stenotrophomonas maltophilia NOT DETECTED NOT DETECTED   Candida albicans NOT DETECTED NOT DETECTED   Candida auris NOT DETECTED NOT DETECTED   Candida glabrata NOT DETECTED NOT DETECTED   Candida krusei NOT DETECTED NOT DETECTED   Candida parapsilosis NOT DETECTED NOT DETECTED   Candida tropicalis NOT DETECTED NOT DETECTED   Cryptococcus neoformans/gattii NOT DETECTED NOT DETECTED    Barrie Folk, PharmD 09/15/2022  7:23 AM

## 2022-09-16 ENCOUNTER — Encounter: Payer: Self-pay | Admitting: Osteopathic Medicine

## 2022-09-16 DIAGNOSIS — G9341 Metabolic encephalopathy: Secondary | ICD-10-CM | POA: Diagnosis not present

## 2022-09-16 DIAGNOSIS — B955 Unspecified streptococcus as the cause of diseases classified elsewhere: Secondary | ICD-10-CM | POA: Diagnosis not present

## 2022-09-16 DIAGNOSIS — J9621 Acute and chronic respiratory failure with hypoxia: Secondary | ICD-10-CM | POA: Diagnosis not present

## 2022-09-16 DIAGNOSIS — N186 End stage renal disease: Secondary | ICD-10-CM | POA: Diagnosis not present

## 2022-09-16 DIAGNOSIS — R7881 Bacteremia: Secondary | ICD-10-CM | POA: Diagnosis not present

## 2022-09-16 LAB — HEPATITIS B SURFACE ANTIBODY, QUANTITATIVE: Hep B S AB Quant (Post): 90.8 m[IU]/mL (ref >9.9)

## 2022-09-16 NOTE — Progress Notes (Signed)
Received patient in bed to unit, alert,asking for breakfast   Informed consent signed and in chart.    TX duration:3.5     Transported By hospital transporter Hand-off given to patient's ED RN Victorino Dike   Access used: L AVF Access issues: None   Total UF removed: Medication(s) given:none Post HD VS: Stable Post HD weight :Pt on stretcher That does not weight:                [facility]

## 2022-09-16 NOTE — ED Notes (Signed)
Pt to dialysis at this time

## 2022-09-16 NOTE — ED Notes (Signed)
Dr. Lyn Hollingshead at bedside assessing pt.

## 2022-09-16 NOTE — Progress Notes (Signed)
[facility]  ROUNDING NOTE   Subjective:   Todd Brown is a 62 year old male with past medical conditions including COPD, hypertension, DVT, schizophrenia, and end-stage renal disease on hemodialysis.  Patient presents to the emergency department with lethargy.   Patient is known to our practice and receives outpatient dialysis treatments at [facility] on a MWF schedule, supervised by Dr. Cherylann Ratel.   Patient seen and evaluated during dialysis   HEMODIALYSIS FLOWSHEET:  Blood Flow Rate (mL/min): 350 mL/min (BFR turned down due to blood pump error, RN made aware) Arterial Pressure (mmHg): -120 mmHg Venous Pressure (mmHg): 220 mmHg TMP (mmHg): 12 mmHg Ultrafiltration Rate (mL/min): 828 mL/min Dialysate Flow Rate (mL/min): 300 ml/min  Currently denying shortness of breath   Objective:  Vital signs in last 24 hours:  Temp:  [98.2 F (36.8 C)-98.8 F (37.1 C)] 98.8 F (37.1 C) (04/17 0830) Pulse Rate:  [57-75] 58 (04/17 1100) Resp:  [11-19] 17 (04/17 1100) BP: (90-151)/(68-90) 110/76 (04/17 1100) SpO2:  [90 %-100 %] 93 % (04/17 1100)  Weight change:  Filed Weights   09/14/22 1907  Weight: 79.4 kg    Intake/Output: I/O last 3 completed shifts: In: 1290 [P.O.:540; IV Piggyback:750] Out: 2000 [Other:2000]   Intake/Output this shift:  No intake/output data recorded.  Physical Exam: General: NAD  Head: Normocephalic, atraumatic. Moist oral mucosal membranes  Eyes: Anicteric  Lungs:  Faint crackles in bases, normal effort, [place] O2  Heart: Regular rate and rhythm  Abdomen:  Soft, nontender  Extremities:  No peripheral edema.  Neurologic: Alert, moving all four extremities  Skin: No lesions  Access: Lt avf    Basic Metabolic Panel: Recent Labs  Lab 09/14/22 1910 09/15/22 1123  NA 138 133*  K 4.5 4.7  CL 95* 94*  CO2 29 27  GLUCOSE 80 98  BUN 34* 44*  CREATININE 6.35* 7.30*  CALCIUM 9.2 9.1  PHOS  --  3.7     Liver Function  Tests: Recent Labs  Lab 09/14/22 1910 09/15/22 1123  AST 31  --   ALT 17  --   ALKPHOS 102  --   BILITOT 1.0  --   PROT 7.3  --   ALBUMIN 3.7 2.9*    No results for input(s): "LIPASE", "AMYLASE" in the last 168 hours. No results for input(s): "AMMONIA" in the last 168 hours.  CBC: Recent Labs  Lab 09/14/22 1910 09/15/22 0752  WBC 12.1* 12.9*  NEUTROABS 10.9*  --   HGB 13.2 13.3  HCT 40.0 40.2  MCV 101.8* 101.8*  PLT 183 186     Cardiac Enzymes: No results for input(s): "CKTOTAL", "CKMB", "CKMBINDEX", "TROPONINI" in the last 168 hours.  BNP: Invalid input(s): "POCBNP"  CBG: No results for input(s): "GLUCAP" in the last 168 hours.  Microbiology: Results for orders placed or performed during the hospital encounter of 09/14/22  Resp panel by RT-PCR (RSV, Flu A&B, Covid) Anterior Nasal Swab     Status: None   Collection Time: 09/14/22  7:10 PM   Specimen: Anterior Nasal Swab  Result Value Ref Range Status   SARS Coronavirus 2 by RT PCR NEGATIVE NEGATIVE Final    Comment: (NOTE) SARS-CoV-2 target nucleic acids are NOT DETECTED.  The SARS-CoV-2 RNA is generally detectable in upper respiratory specimens during the acute phase of infection. The lowest concentration of SARS-CoV-2 viral copies this assay can detect is 138 copies/mL. A negative result does not preclude SARS-Cov-2 infection and should not be used as the sole  basis for treatment or other patient management decisions. A negative result may occur with  improper specimen collection/handling, submission of specimen other than nasopharyngeal swab, presence of viral mutation(s) within the areas targeted by this assay, and inadequate number of viral copies(<138 copies/mL). A negative result must be combined with clinical observations, patient history, and epidemiological information. The expected result is Negative.  Fact Sheet for Patients:  BloggerCourse.com  Fact Sheet for  Healthcare Providers:  SeriousBroker.it  This test is no t yet approved or cleared by the [place] FDA and  has been authorized for detection and/or diagnosis of SARS-CoV-2 by FDA under an Emergency Use Authorization (EUA). This EUA will remain  in effect (meaning this test can be used) for the duration of the COVID-19 declaration under Section 564(b)(1) of the Act, 21 U.S.C.section 360bbb-3(b)(1), unless the authorization is terminated  or revoked sooner.       Influenza A by PCR NEGATIVE NEGATIVE Final   Influenza B by PCR NEGATIVE NEGATIVE Final    Comment: (NOTE) The Xpert Xpress SARS-CoV-2/FLU/RSV plus assay is intended as an aid in the diagnosis of influenza from Nasopharyngeal swab specimens and should not be used as a sole basis for treatment. Nasal washings and aspirates are unacceptable for Xpert Xpress SARS-CoV-2/FLU/RSV testing.  Fact Sheet for Patients: BloggerCourse.com  Fact Sheet for Healthcare Providers: SeriousBroker.it  This test is not yet approved or cleared by the [place] FDA and has been authorized for detection and/or diagnosis of SARS-CoV-2 by FDA under an Emergency Use Authorization (EUA). This EUA will remain in effect (meaning this test can be used) for the duration of the COVID-19 declaration under Section 564(b)(1) of the Act, 21 U.S.C. section 360bbb-3(b)(1), unless the authorization is terminated or revoked.     Resp Syncytial Virus by PCR NEGATIVE NEGATIVE Final    Comment: (NOTE) Fact Sheet for Patients: BloggerCourse.com  Fact Sheet for Healthcare Providers: SeriousBroker.it  This test is not yet approved or cleared by the [place] FDA and has been authorized for detection and/or diagnosis of SARS-CoV-2 by FDA under an Emergency Use Authorization (EUA). This EUA will remain in effect (meaning this  test can be used) for the duration of the COVID-19 declaration under Section 564(b)(1) of the Act, 21 U.S.C. section 360bbb-3(b)(1), unless the authorization is terminated or revoked.  Performed at [facility], [address], [place], [place] [address]   Blood Culture (routine x 2)     Status: None (Preliminary result)   Collection Time: 09/14/22  7:10 PM   Specimen: BLOOD  Result Value Ref Range Status   Specimen Description   Final    BLOOD BLOOD RIGHT WRIST Performed at [facility], [address] [place], [place] [address]    Special Requests   Final    BOTTLES DRAWN AEROBIC AND ANAEROBIC Blood Culture results may not be optimal due to an inadequate volume of blood received in culture bottles Performed at [facility], [address], [place], [place] [address]    Culture  Setup Time   Final    GRAM POSITIVE COCCI IN BOTH AEROBIC AND ANAEROBIC BOTTLES CRITICAL RESULT CALLED TO, READ BACK BY AND VERIFIED WITH: Albertine Grates 09/15/22 0715 MW Performed at [facility], [address], [place], [place] [address]    Culture [facility] POSITIVE COCCI  Final   Report Status PENDING  Incomplete  Blood Culture ID Panel (Reflexed)     Status: Abnormal   Collection Time: 09/14/22  7:10 PM  Result Value Ref Range Status   Enterococcus faecalis NOT DETECTED NOT DETECTED Final   Enterococcus Faecium NOT DETECTED NOT DETECTED Final   Listeria monocytogenes NOT DETECTED NOT DETECTED Final   Staphylococcus species NOT DETECTED NOT DETECTED Final   Staphylococcus aureus (BCID) NOT DETECTED NOT DETECTED Final   Staphylococcus epidermidis NOT DETECTED NOT DETECTED Final   Staphylococcus lugdunensis NOT DETECTED NOT DETECTED Final   Streptococcus species DETECTED (A) NOT DETECTED Final    Comment: Not Enterococcus species, Streptococcus agalactiae, Streptococcus pyogenes, or Streptococcus pneumoniae. CRITICAL RESULT CALLED TO, READ BACK BY AND VERIFIED  WITH: TRAY GREENWOOD 09/15/22 0715 MW    Streptococcus agalactiae NOT DETECTED NOT DETECTED Final   Streptococcus pneumoniae NOT DETECTED NOT DETECTED Final   Streptococcus pyogenes NOT DETECTED NOT DETECTED Final   A.calcoaceticus-baumannii NOT DETECTED NOT DETECTED Final   Bacteroides fragilis NOT DETECTED NOT DETECTED Final   Enterobacterales NOT DETECTED NOT DETECTED Final   Enterobacter cloacae complex NOT DETECTED NOT DETECTED Final   Escherichia coli NOT DETECTED NOT DETECTED Final   Klebsiella aerogenes NOT DETECTED NOT DETECTED Final   Klebsiella oxytoca NOT DETECTED NOT DETECTED Final   Klebsiella pneumoniae NOT DETECTED NOT DETECTED Final   Proteus species NOT DETECTED NOT DETECTED Final   Salmonella species NOT DETECTED NOT DETECTED Final   Serratia marcescens NOT DETECTED NOT DETECTED Final   Haemophilus influenzae NOT DETECTED NOT DETECTED Final   Neisseria meningitidis NOT DETECTED NOT DETECTED Final   Pseudomonas aeruginosa NOT DETECTED NOT DETECTED Final   Stenotrophomonas maltophilia NOT DETECTED NOT DETECTED Final   Candida albicans NOT DETECTED NOT DETECTED Final   Candida auris NOT DETECTED NOT DETECTED Final   Candida glabrata NOT DETECTED NOT DETECTED Final   Candida krusei NOT DETECTED NOT DETECTED Final   Candida parapsilosis NOT DETECTED NOT DETECTED Final   Candida tropicalis NOT DETECTED NOT DETECTED Final   Cryptococcus neoformans/gattii NOT DETECTED NOT DETECTED Final    Comment: Performed at [facility], [address] [place], [place] [address]  Blood Culture (routine x 2)     Status: None (Preliminary result)   Collection Time: 09/14/22  7:15 PM   Specimen: BLOOD  Result Value Ref Range Status   Specimen Description   Final    BLOOD RIGHT ANTECUBITAL Performed at [facility], [address] [place], [place] [address]    Special Requests   Final    BOTTLES DRAWN AEROBIC AND ANAEROBIC Blood Culture results may not be optimal  due to an inadequate volume of blood received in culture bottles Performed at [facility], [address] [place], [place] [address]    Culture  Setup Time   Final    GRAM POSITIVE COCCI IN BOTH AEROBIC AND ANAEROBIC BOTTLES CRITICAL RESULT CALLED TO, READ BACK BY AND VERIFIED WITH: TRAY GREENWOOD 09/15/22 0715 MW CRITICAL VALUE NOTED.  VALUE IS CONSISTENT WITH PREVIOUSLY REPORTED AND CALLED VALUE. Performed at [facility], [address] [place], [place] [address]    Culture GRAM POSITIVE COCCI  Final   Report Status PENDING  Incomplete    Coagulation Studies: Recent Labs    09/14/22 1910  LABPROT 13.9  INR 1.1     Urinalysis: No results for input(s): "COLORURINE", "LABSPEC", "PHURINE", "GLUCOSEU", "HGBUR", "BILIRUBINUR", "KETONESUR", "PROTEINUR", "UROBILINOGEN", "NITRITE", "LEUKOCYTESUR" in the last 72 hours.  Invalid input(s): "APPERANCEUR"    Imaging: CT CHEST ABDOMEN PELVIS W CONTRAST  Result Date: 09/14/2022 CLINICAL DATA:  Sepsis.  Pt with c/o lethargy after dialysis today. Pt received full tx at dialysis. Hx of COPD, wears 02 at 2L via [place]. EMS note pt to be 76% upon arrival to scene. EXAM: CT CHEST, ABDOMEN, AND PELVIS WITH CONTRAST TECHNIQUE: Multidetector CT imaging of the chest, abdomen and pelvis was performed following the standard protocol during bolus administration of intravenous contrast. RADIATION DOSE REDUCTION: This exam was performed according to the departmental dose-optimization program which includes automated exposure control, adjustment of the mA and/or kV according to patient size and/or use of iterative reconstruction technique. CONTRAST:  OMNIPAQUE IOHEXOL 300 MG/ML  SOLN COMPARISON:  CT chest 06/19/2022 FINDINGS: CT CHEST FINDINGS Cardiovascular: Enlarged heart size. Small volume pericardial effusion. The thoracic aorta is normal in caliber. Mild atherosclerotic plaque of the thoracic aorta. Four vessel coronary artery  calcifications. The main pulmonary artery is borderline enlarged measuring up to 3.1 cm. No central pulmonary embolus. Mediastinum/Nodes: No enlarged mediastinal, hilar, or axillary lymph nodes. Thyroid gland, trachea, and esophagus demonstrate no significant findings. Lungs/Pleura: Interlobular septal wall thickening. Bibasilar passive atelectasis. Lingular ground-glass airspace opacity likely atelectasis. No focal consolidation. No pulmonary nodule. No pulmonary mass. Bilateral trace, left greater than right, pleural effusions. No pneumothorax. Musculoskeletal: No chest wall abnormality.  Bilateral gynecomastia. No suspicious lytic or blastic osseous lesions. No acute displaced fracture. Old healed sternal fracture. CT ABDOMEN PELVIS FINDINGS Hepatobiliary: No focal liver abnormality. No gallstones, gallbladder wall thickening, or pericholecystic fluid. No biliary dilatation. Pancreas: No focal lesion. Normal pancreatic contour. No surrounding inflammatory changes. No main pancreatic ductal dilatation. Spleen: Normal in size without focal abnormality. Adrenals/Urinary Tract: Bilateral adrenal gland hyperplasia with a poorly visualized 2 cm left adrenal gland nodule demonstrating a density of 7 Hounsfield units-consistent with a lipid rich adenoma, no further follow-up indicated. Bilateral renal cortical scarring. Bilateral kidneys enhance symmetrically. Fluid density lesions likely represent simple renal cysts. Simple renal cysts, in the absence of clinically indicated signs/symptoms, require no independent follow-up. No hydronephrosis. No hydroureter. The urinary bladder is unremarkable. No excretion of intravenous contrast on delayed imaging. Stomach/Bowel: Stomach is within normal limits. No evidence of bowel wall thickening or dilatation. Appendix appears normal. Vascular/Lymphatic: No abdominal aorta or iliac aneurysm. At least moderate atherosclerotic plaque of the aorta and its branches. No abdominal,  pelvic, or inguinal lymphadenopathy. Reproductive: Prostate is unremarkable. Other: At least small volume simple free fluid within the abdomen pelvis. No intraperitoneal free gas. No organized fluid collection. Musculoskeletal: No abdominal wall hernia or abnormality. No suspicious lytic or blastic osseous lesions. No acute displaced fracture. IMPRESSION: 1. Findings of volume overload. 2. Mild pulmonary edema with bilateral trace, left greater than right, pleural effusions. Associated bilateral lower lobe and lingular atelectasis. Superimposed infection not excluded. 3. Cardiomegaly. 4. Small volume pericardial effusion. 5. At least small volume simple free fluid ascites. 6. Anasarca. 7. Aortic Atherosclerosis (ICD10-I70.0) including four-vessel coronary calcification. 8. No excretion of intravenous contrast on delayed imaging. Correlate with creatinine levels. Electronically Signed   By: Tish Frederickson M.D.   On: 09/14/2022 21:25   DG Chest Port 1 View  Result Date: 09/14/2022 CLINICAL DATA:  Questionable sepsis - evaluate for abnormality Lethargy after dialysis today. EXAM: PORTABLE CHEST 1 VIEW COMPARISON:  Radiograph and CT 06/19/2022 FINDINGS: The previous right-sided dialysis catheter is been removed. Cardiomegaly is unchanged. Mediastinal contours are unchanged. Small bilateral pleural effusions which have increased from prior exam. Diffuse interstitial thickening may represent pulmonary edema superimposed on emphysema. No pneumothorax. IMPRESSION: Unchanged cardiomegaly. Small bilateral pleural  effusions have increased from prior exam. Diffuse interstitial thickening may represent pulmonary edema superimposed on emphysema. Overall findings suggest fluid overload/CHF. Electronically Signed   By: Narda Rutherford M.D.   On: 09/14/2022 19:43     Medications:    cefTRIAXone (ROCEPHIN)  IV Stopped (09/15/22 0836)    calcium acetate  1,334 mg Oral TID WC   carbamazepine  1,000 mg Oral q morning    Chlorhexidine Gluconate Cloth  6 each Topical Q0600   fluPHENAZine  2.5 mg Oral Daily   heparin  5,000 Units Subcutaneous Q8H   levothyroxine  50 mcg Oral Q0600   pantoprazole  40 mg Oral Daily   trihexyphenidyl  2 mg Oral BID   acetaminophen **OR** acetaminophen, HYDROcodone-acetaminophen, morphine injection, ondansetron **OR** ondansetron (ZOFRAN) IV  Assessment/ Plan:  Mr. HOSAM MCFETRIDGE is a 62 y.o.  male is a 62 year old male with past medical conditions including COPD, hypertension, DVT, schizophrenia, and end-stage renal disease on hemodialysis.  Patient presents to the emergency department with lethargy  [facility]/MWF/left aVF   Fluid volume overload with end stage renal disease on hemodialysis. CT chest abd pelvis show fluid overload with mild pulmonary edema and pleural effusions.  Patient received dialysis yesterday, UF 2 L achieved.  Receiving treatment today to maintain outpatient schedule.  Will attempt an additional 2 L.  Next treatment scheduled for Friday.  2. Anemia of chronic kidney disease Lab Results  Component Value Date   HGB 13.3 09/15/2022    Hemoglobin within desired range.  No need for ESA's   3. Secondary Hyperparathyroidism: with outpatient labs: PTH 459, phosphorus 3.4, calcium 9.7 on 06/08/22 .   Lab Results  Component Value Date   PTH 194 (H) 05/11/2017   CALCIUM 9.1 09/15/2022   CAION 1.19 03/27/2022   PHOS 3.7 09/15/2022   Patient receives cholecalciferol and calcium acetate outpatient.  Will continue to monitor bone minerals.  4.  Hypertension with chronic kidney disease.  Home regimen includes amlodipine, clonidine, hydralazine, isosorbide, doxazosin and minoxidil.  All currently held at this time.  Blood pressure stable during dialysis, 132/85.   LOS: 1   4/17/202411:17 AM

## 2022-09-16 NOTE — Progress Notes (Signed)
Date of Admission:  09/14/2022    ID: Todd Brown is a 62 y.o. male  Principal Problem:   Acute on chronic respiratory failure with hypoxia Active Problems:   Schizophrenia   Essential hypertension, benign   Severe sepsis versus SIRS   COPD (chronic obstructive pulmonary disease)   Acute metabolic encephalopathy   Essential hypertension   ESRD on dialysis   Bacteremia   CAP (community acquired pneumonia)   Anasarca (mild pulmonary edema, pleural and pericardial effusion, ascites on CT)   Acute on chronic respiratory failure    Subjective: Pt is more alert today But repeats the question that  I ask him but does not give an answer -only says I dont know   Medications:   calcium acetate  1,334 mg Oral TID WC   carbamazepine  1,000 mg Oral q morning   Chlorhexidine Gluconate Cloth  6 each Topical Q0600   fluPHENAZine  2.5 mg Oral Daily   heparin  5,000 Units Subcutaneous Q8H   levothyroxine  50 mcg Oral Q0600   pantoprazole  40 mg Oral Daily   trihexyphenidyl  2 mg Oral BID    Objective: Vital signs in last 24 hours: Patient Vitals for the past 24 hrs:  BP Temp Temp src Pulse Resp SpO2  09/16/22 1315 (!) 142/78 98.6 F (37 C) Oral (!) 57 11 99 %  09/16/22 1215 (!) 142/83 98.7 F (37.1 C) Oral (!) 57 14 99 %  09/16/22 1200 (!) 147/80 -- -- (!) 58 16 (!) 88 %  09/16/22 1130 121/87 -- -- (!) 57 17 100 %  09/16/22 1100 110/76 -- -- (!) 58 17 93 %  09/16/22 1030 117/73 -- -- 65 14 90 %  09/16/22 1000 121/88 -- -- 67 11 91 %  09/16/22 0930 126/82 -- -- 66 12 97 %  09/16/22 0900 (!) 124/90 -- -- 60 16 98 %  09/16/22 0841 132/83 -- -- (!) 57 17 100 %  09/16/22 0830 132/83 98.8 F (37.1 C) Oral 60 18 100 %  09/16/22 0657 -- -- -- 61 11 100 %  09/16/22 0649 114/76 98.6 F (37 C) Oral 63 15 99 %  09/16/22 0258 118/76 98.2 F (36.8 C) Oral 68 17 99 %  09/15/22 2157 138/85 98.5 F (36.9 C) Oral 61 12 98 %  09/15/22 1530 (!) 146/84 -- -- 64 -- 100 %  09/15/22 1515  (!) 149/79 -- -- 67 12 100 %  09/15/22 1500 (!) 151/90 -- -- 60 16 100 %  09/15/22 1430 116/75 -- -- 66 18 100 %       PHYSICAL EXAM:  General: Alert, cooperative, no distress, blind both eyes- oriented in person  Does follow commands like lifting arms, opening mouth etc Head: Normocephalic, without obvious abnormality, atraumatic. Eyes: Conjunctivae clear, anicteric sclerae. Pupils are equal Does not perceive light edentulous Neck: Supple, symmetrical, no adenopathy, thyroid: non tender no carotid bruit and no JVD. Lungs: b/la air entry Heart: Regular rate and rhythm, no murmur, rub or gallop. Abdomen: Soft, non-tender,not distended. Bowel sounds normal. No masses Extremities: Left AVG Skin: No rashes or lesions. Or bruising Lymph: Cervical, supraclavicular normal. Neurologic: Grossly non-focal  Lab Results    Latest Ref Rng & Units 09/15/2022    7:52 AM 09/14/2022    7:10 PM 06/30/2022    2:58 AM  CBC  WBC 4.0 - 10.5 K/uL 12.9  12.1  5.9   Hemoglobin 13.0 - 17.0 g/dL 42.5  95.6  8.9   Hematocrit 39.0 - 52.0 % 40.2  40.0  26.8   Platelets 150 - 400 K/uL 186  183  228        Latest Ref Rng & Units 09/15/2022   11:23 AM 09/14/2022    7:10 PM 06/30/2022    2:58 AM  CMP  Glucose 70 - 99 mg/dL 98  80  409   BUN 8 - 23 mg/dL 44  34  39   Creatinine 0.61 - 1.24 mg/dL 8.11  9.14  7.82   Sodium 135 - 145 mmol/L 133  138  130   Potassium 3.5 - 5.1 mmol/L 4.7  4.5  4.3   Chloride 98 - 111 mmol/L 94  95  93   CO2 22 - 32 mmol/L 27  29  30    Calcium 8.9 - 10.3 mg/dL 9.1  9.2  8.7   Total Protein 6.5 - 8.1 g/dL  7.3  5.8   Total Bilirubin 0.3 - 1.2 mg/dL  1.0  0.7   Alkaline Phos 38 - 126 U/L  102  86   AST 15 - 41 U/L  31  21   ALT 0 - 44 U/L  17  <5       Microbiology: [facility]- streptococcus Studies/Results: CT CHEST ABDOMEN PELVIS W CONTRAST  Result Date: 09/14/2022 CLINICAL DATA:  Sepsis. Pt with c/o lethargy after dialysis today. Pt received full tx at dialysis. Hx of  COPD, wears 02 at 2L via [place]. EMS note pt to be 76% upon arrival to scene. EXAM: CT CHEST, ABDOMEN, AND PELVIS WITH CONTRAST TECHNIQUE: Multidetector CT imaging of the chest, abdomen and pelvis was performed following the standard protocol during bolus administration of intravenous contrast. RADIATION DOSE REDUCTION: This exam was performed according to the departmental dose-optimization program which includes automated exposure control, adjustment of the mA and/or kV according to patient size and/or use of iterative reconstruction technique. CONTRAST:  OMNIPAQUE IOHEXOL 300 MG/ML  SOLN COMPARISON:  CT chest 06/19/2022 FINDINGS: CT CHEST FINDINGS Cardiovascular: Enlarged heart size. Small volume pericardial effusion. The thoracic aorta is normal in caliber. Mild atherosclerotic plaque of the thoracic aorta. Four vessel coronary artery calcifications. The main pulmonary artery is borderline enlarged measuring up to 3.1 cm. No central pulmonary embolus. Mediastinum/Nodes: No enlarged mediastinal, hilar, or axillary lymph nodes. Thyroid gland, trachea, and esophagus demonstrate no significant findings. Lungs/Pleura: Interlobular septal wall thickening. Bibasilar passive atelectasis. Lingular ground-glass airspace opacity likely atelectasis. No focal consolidation. No pulmonary nodule. No pulmonary mass. Bilateral trace, left greater than right, pleural effusions. No pneumothorax. Musculoskeletal: No chest wall abnormality.  Bilateral gynecomastia. No suspicious lytic or blastic osseous lesions. No acute displaced fracture. Old healed sternal fracture. CT ABDOMEN PELVIS FINDINGS Hepatobiliary: No focal liver abnormality. No gallstones, gallbladder wall thickening, or pericholecystic fluid. No biliary dilatation. Pancreas: No focal lesion. Normal pancreatic contour. No surrounding inflammatory changes. No main pancreatic ductal dilatation. Spleen: Normal in size without focal abnormality. Adrenals/Urinary Tract:  Bilateral adrenal gland hyperplasia with a poorly visualized 2 cm left adrenal gland nodule demonstrating a density of 7 Hounsfield units-consistent with a lipid rich adenoma, no further follow-up indicated. Bilateral renal cortical scarring. Bilateral kidneys enhance symmetrically. Fluid density lesions likely represent simple renal cysts. Simple renal cysts, in the absence of clinically indicated signs/symptoms, require no independent follow-up. No hydronephrosis. No hydroureter. The urinary bladder is unremarkable. No excretion of intravenous contrast on delayed imaging. Stomach/Bowel: Stomach is within normal limits. No evidence of bowel wall thickening  or dilatation. Appendix appears normal. Vascular/Lymphatic: No abdominal aorta or iliac aneurysm. At least moderate atherosclerotic plaque of the aorta and its branches. No abdominal, pelvic, or inguinal lymphadenopathy. Reproductive: Prostate is unremarkable. Other: At least small volume simple free fluid within the abdomen pelvis. No intraperitoneal free gas. No organized fluid collection. Musculoskeletal: No abdominal wall hernia or abnormality. No suspicious lytic or blastic osseous lesions. No acute displaced fracture. IMPRESSION: 1. Findings of volume overload. 2. Mild pulmonary edema with bilateral trace, left greater than right, pleural effusions. Associated bilateral lower lobe and lingular atelectasis. Superimposed infection not excluded. 3. Cardiomegaly. 4. Small volume pericardial effusion. 5. At least small volume simple free fluid ascites. 6. Anasarca. 7. Aortic Atherosclerosis (ICD10-I70.0) including four-vessel coronary calcification. 8. No excretion of intravenous contrast on delayed imaging. Correlate with creatinine levels. Electronically Signed   By: Tish Frederickson M.D.   On: 09/14/2022 21:25   DG Chest Port 1 View  Result Date: 09/14/2022 CLINICAL DATA:  Questionable sepsis - evaluate for abnormality Lethargy after dialysis today. EXAM:  PORTABLE CHEST 1 VIEW COMPARISON:  Radiograph and CT 06/19/2022 FINDINGS: The previous right-sided dialysis catheter is been removed. Cardiomegaly is unchanged. Mediastinal contours are unchanged. Small bilateral pleural effusions which have increased from prior exam. Diffuse interstitial thickening may represent pulmonary edema superimposed on emphysema. No pneumothorax. IMPRESSION: Unchanged cardiomegaly. Small bilateral pleural effusions have increased from prior exam. Diffuse interstitial thickening may represent pulmonary edema superimposed on emphysema. Overall findings suggest fluid overload/CHF. Electronically Signed   By: Narda Rutherford M.D.   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  He refused TEE at that time.  He was also recommended colonoscopy as this organism is associated with colon cancer.  But I do not think it was done So we need to get a TEE this admission Will also need colonoscopy Also need to look into the AV graft as it had been malfunctioning.  Need  limited WBC scan to look at the graft Patient is currently on  ceftriaxone.     End-stage renal disease on dialysis   Hypertension:    Schizophrenia On fluphenazine Also on Tegretol and trihexyphenidyl   Hypothyroidism on levothyroxine   Blindness both eyes Discussed the management with care team

## 2022-09-16 NOTE — Progress Notes (Signed)
PROGRESS NOTE    Todd Brown   VWU:[id number] DOB: 04-Feb-1961  DOA: 09/14/2022 Date of Service: 09/16/22 PCP: Smiley Houseman, NP     Brief Narrative / Hospital Course:  Todd Brown is a 62 y.o. 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  Patient was in his usual state of health earlier and went to and completed his dialysis session but on arrival home he was lethargic.  On arrival of EMS he was saturating in the 70s on room air and was placed on NRB for transport. Of note: bacteremia secondary to strep gallolyticus in 01/24. Suspected source was from the PermCath (PermCath was removed during that hospitalization). Standard ECHO was obtained which did not show any vegetation.  ID was consulted and they requested TEE and colonscopy, however, the pt did not agree to have these procedures. Pt was continued on IV cefazolin 2g MWF with dialysis for strep gallolyticus bacteremia (END DATE Jul 20 2022).   04/15: ED course and data review: Tmax 104.2 with pulse 107 respirations 23 and O2 sat initially 89% on NRB improving to 94% on HFNC at 6 L.  BP was 154/86 on arrival.  VBG on HFNC showed pH 7.5 with pCO2 40.  Labs otherwise significant for WBC 12,100, lactic acid 0.9 and procalcitonin 0.72.  Respiratory viral panel negative for COVID flu and RSV.  Troponin 81. Patient started on cefepime vancomycin and Flagyl for sepsis of unknown source. Hospitalist consulted for admission.  04/16: Blood culture revealed strep species. Discontinued cefepime, vancomycin and Flagyl and place patient on Rocephin 2 g IV daily. Consulted ID - recs for TEE and  colonoscopy, AV graft evauation. Patient did not receive IV fluid resuscitation due to fluid overload. Removed 2L in HD 04/17: dialysis today - examined afterward. Pt has no complaints other than wants something to eat. Removed another 2L in HD. ID to see today.   Consultants:  Nephrology  Infectious Disease   Procedures: None       ASSESSMENT & PLAN:   Principal Problem:   Acute on chronic respiratory failure with hypoxia Active Problems:   Severe sepsis versus SIRS   CAP (community acquired pneumonia)   Anasarca (mild pulmonary edema, pleural and pericardial effusion, ascites on CT)   Acute metabolic encephalopathy   ESRD on dialysis   Essential hypertension   COPD (chronic obstructive pulmonary disease)   Schizophrenia   Essential hypertension, benign   Bacteremia   Acute on chronic respiratory failure  Severe sepsis  Community-acquired pneumonia  Streptococcus bacteremia.   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  Organ dysfunction of respiratory failure and  altered mental status. CT chest abdomen and pelvis consistent with anasarca.  Cannot rule out superimposed pulmonary infection Blood culture revealed strep species Continue Rocephin 2 g IV daily Consult ID --> will need TEE and colonoscopy, evaluate graft  Patient did not receive IV fluid resuscitation due to fluid overload, conitnue w/ HD  Acute on chronic respiratory failure with hypoxia Etiology acute on chronic diastolic CHF and CAP  Continue high flow nasal cannula to maintain sats over 92% and wean as  tolerated Expect improvement in oxygenation following resolution of acute illness  Acute metabolic encephalopathy Secondary to sepsis from Streptococcus bacteremia Patient is oriented to person and place but not to time Monitor patient closely    Acute on chronic diastolic  dysfunction CHF Anasarca  EF 06/2022 showing EF 60 to 65% with grade 1 diastolic dysfunction CT with findings of volume overload, mild pulmonary edema with pleural effusions, small volume pericardial effusion and anasarca and small volume fluid ascites Nephrology consult for management of fluid status   ESRD on dialysis Nephrology consult for renal replacement therapy   Essential hypertension Currently normotensive Will hold antihypertensives in view of severe sepsis diagnosis   COPD (chronic obstructive pulmonary disease) Continue home inhalers DuoNebs as needed   Schizophrenia Continue Prolixin and carbamazepine  Vision impairment Precautions for fall   DVT prophylaxis: heparin Pertinent IV fluids/nutrition: no continuous IV fluids, cardiac diet  Central lines / invasive devices: none  Code Status: FULL CODE  Current Admission Status: inpatient  TOC needs / Dispo plan: TBD, pending PT/OT eval. He comes from assisted living.  Barriers to discharge / significant pending items: bacteremia, ID recs, fluid overload              Subjective / Brief ROS:  Patient reports no concerns other than demanding something to eat.  Denies CP/SOB.  Pain controlled.    Family Communication: called legal guardian to give update - [name] at [employer], left HIPAA compliant vm 09/16/22 4:04 PM     Objective Findings:  Vitals:   09/16/22 1130 09/16/22 1200 09/16/22 1215 09/16/22 1315  BP: 121/87 (!) 147/80 (!) 142/83 (!) 142/78  Pulse: (!) 57 (!) 58 (!) 57 (!) 57  Resp: 17 16 14 11   Temp:   98.7 F (37.1 C) 98.6 F (37 C)  TempSrc:   Oral Oral  SpO2: 100% (!) 88% 99% 99%  Weight:      Height:        Intake/Output Summary (Last 24 hours) at 09/16/2022 1604 Last data filed at 09/16/2022 1430 Gross per 24 hour  Intake 100 ml  Output --  Net 100 ml   Filed Weights   09/14/22 1907  Weight: 79.4 kg    Examination:  Physical Exam Constitutional:      General: He  is not in acute distress. Cardiovascular:     Rate and Rhythm: Normal rate and regular rhythm.  Pulmonary:     Effort: Pulmonary effort is normal.     Breath sounds: Normal breath sounds.  Abdominal:     General: Abdomen is flat.     Palpations: Abdomen is soft.  Musculoskeletal:     Right lower leg: Edema (+1) present.     Left lower leg: Edema (+1) present.  Skin:    General: Skin is warm and dry.  Neurological:     General: No focal deficit present.     Mental Status: He is alert. Mental status is at baseline.  Psychiatric:        Behavior: Behavior normal.          Scheduled Medications:   calcium acetate  1,334 mg Oral TID WC   carbamazepine  1,000 mg Oral q morning   Chlorhexidine Gluconate Cloth  6 each Topical Q0600   fluPHENAZine  2.5 mg Oral Daily   heparin  5,000 Units Subcutaneous Q8H   levothyroxine  50 mcg Oral Q0600   pantoprazole  40 mg Oral Daily   trihexyphenidyl  2 mg  Oral BID    Continuous Infusions:  cefTRIAXone (ROCEPHIN)  IV Stopped (09/16/22 1430)    PRN Medications:  acetaminophen **OR** acetaminophen, HYDROcodone-acetaminophen, morphine injection, ondansetron **OR** ondansetron (ZOFRAN) IV  Antimicrobials from admission:  Anti-infectives (From admission, onward)    Start     Dose/Rate Route Frequency Ordered Stop   09/16/22 2200  vancomycin (VANCOREADY) IVPB 750 mg/150 mL  Status:  Discontinued        750 mg 150 mL/hr over 60 Minutes Intravenous Every M-W-F (Hemodialysis) 09/15/22 0031 09/15/22 1329   09/15/22 1000  ceFEPIme (MAXIPIME) 2 g in sodium chloride 0.9 % 100 mL IVPB  Status:  Discontinued        2 g 200 mL/hr over 30 Minutes Intravenous Every 12 hours 09/15/22 0031 09/15/22 0734   09/15/22 0800  metroNIDAZOLE (FLAGYL) IVPB 500 mg  Status:  Discontinued        500 mg 100 mL/hr over 60 Minutes Intravenous Every 12 hours 09/14/22 2248 09/15/22 1329   09/15/22 0800  cefTRIAXone (ROCEPHIN) 2 g in sodium chloride 0.9 % 100 mL  IVPB        2 g 200 mL/hr over 30 Minutes Intravenous Every 24 hours 09/15/22 0734     09/15/22 0000  vancomycin (VANCOREADY) IVPB 750 mg/150 mL        750 mg 150 mL/hr over 60 Minutes Intravenous  Once 09/14/22 2258 09/15/22 0200   09/14/22 1915  ceFEPIme (MAXIPIME) 2 g in sodium chloride 0.9 % 100 mL IVPB        2 g 200 mL/hr over 30 Minutes Intravenous  Once 09/14/22 1909 09/14/22 1949   09/14/22 1915  metroNIDAZOLE (FLAGYL) IVPB 500 mg        500 mg 100 mL/hr over 60 Minutes Intravenous  Once 09/14/22 1909 09/14/22 2052   09/14/22 1915  vancomycin (VANCOCIN) IVPB 1000 mg/200 mL premix        1,000 mg 200 mL/hr over 60 Minutes Intravenous  Once 09/14/22 1909 09/14/22 2210           Data Reviewed:  I have personally reviewed the following...  CBC: Recent Labs  Lab 09/14/22 1910 09/15/22 0752  WBC 12.1* 12.9*  NEUTROABS 10.9*  --   HGB 13.2 13.3  HCT 40.0 40.2  MCV 101.8* 101.8*  PLT 183 186   Basic Metabolic Panel: Recent Labs  Lab 09/14/22 1910 09/15/22 1123  NA 138 133*  K 4.5 4.7  CL 95* 94*  CO2 29 27  GLUCOSE 80 98  BUN 34* 44*  CREATININE 6.35* 7.30*  CALCIUM 9.2 9.1  PHOS  --  3.7   GFR: Estimated Creatinine Clearance: 11.3 mL/min (A) (by C-G formula based on SCr of 7.3 mg/dL (H)). Liver Function Tests: Recent Labs  Lab 09/14/22 1910 09/15/22 1123  AST 31  --   ALT 17  --   ALKPHOS 102  --   BILITOT 1.0  --   PROT 7.3  --   ALBUMIN 3.7 2.9*   No results for input(s): "LIPASE", "AMYLASE" in the last 168 hours. No results for input(s): "AMMONIA" in the last 168 hours. Coagulation Profile: Recent Labs  Lab 09/14/22 1910  INR 1.1   Cardiac Enzymes: No results for input(s): "CKTOTAL", "CKMB", "CKMBINDEX", "TROPONINI" in the last 168 hours. BNP (last 3 results) No results for input(s): "PROBNP" in the last 8760 hours. HbA1C: No results for input(s): "HGBA1C" in the last 72 hours. CBG: No results for input(s): "GLUCAP" in the last  168 hours. Lipid Profile: No results for input(s): "CHOL", "HDL", "LDLCALC", "TRIG", "CHOLHDL", "LDLDIRECT" in the last 72 hours. Thyroid Function Tests: No results for input(s): "TSH", "T4TOTAL", "FREET4", "T3FREE", "THYROIDAB" in the last 72 hours. Anemia Panel: No results for input(s): "VITAMINB12", "FOLATE", "FERRITIN", "TIBC", "IRON", "RETICCTPCT" in the last 72 hours. Most Recent Urinalysis On File:     Component Value Date/Time   COLORURINE YELLOW (A) 05/17/2015 0012   APPEARANCEUR HAZY (A) 05/17/2015 0012   LABSPEC 1.009 05/17/2015 0012   PHURINE 7.0 05/17/2015 0012   GLUCOSEU NEGATIVE 05/17/2015 0012   HGBUR 1+ (A) 05/17/2015 0012   BILIRUBINUR NEGATIVE 05/17/2015 0012   KETONESUR NEGATIVE 05/17/2015 0012   PROTEINUR 30 (A) 05/17/2015 0012   NITRITE NEGATIVE 05/17/2015 0012   LEUKOCYTESUR 3+ (A) 05/17/2015 0012   Sepsis Labs: (procalcitonin:4,lacticidven:4) Microbiology: Recent Results (from the past 240 hour(s))  Resp panel by RT-PCR (RSV, Flu A&B, Covid) Anterior Nasal Swab     Status: None   Collection Time: 09/14/22  7:10 PM   Specimen: Anterior Nasal Swab  Result Value Ref Range Status   SARS Coronavirus 2 by RT PCR NEGATIVE NEGATIVE Final    Comment: (NOTE) SARS-CoV-2 target nucleic acids are NOT DETECTED.  The SARS-CoV-2 RNA is generally detectable in upper respiratory specimens during the acute phase of infection. The lowest concentration of SARS-CoV-2 viral copies this assay can detect is 138 copies/mL. A negative result does not preclude SARS-Cov-2 infection and should not be used as the sole basis for treatment or other patient management decisions. A negative result may occur with  improper specimen collection/handling, submission of specimen other than nasopharyngeal swab, presence of viral mutation(s) within the areas targeted by this assay, and inadequate number of viral copies(<138 copies/mL). A negative result must be combined  with clinical observations, patient history, and epidemiological information. The expected result is Negative.  Fact Sheet for Patients:  BloggerCourse.com  Fact Sheet for Healthcare Providers:  SeriousBroker.it  This test is no t yet approved or cleared by the [place] FDA and  has been authorized for detection and/or diagnosis of SARS-CoV-2 by FDA under an Emergency Use Authorization (EUA). This EUA will remain  in effect (meaning this test can be used) for the duration of the COVID-19 declaration under Section 564(b)(1) of the Act, 21 U.S.C.section 360bbb-3(b)(1), unless the authorization is terminated  or revoked sooner.       Influenza A by PCR NEGATIVE NEGATIVE Final   Influenza B by PCR NEGATIVE NEGATIVE Final    Comment: (NOTE) The Xpert Xpress SARS-CoV-2/FLU/RSV plus assay is intended as an aid in the diagnosis of influenza from Nasopharyngeal swab specimens and should not be used as a sole basis for treatment. Nasal washings and aspirates are unacceptable for Xpert Xpress SARS-CoV-2/FLU/RSV testing.  Fact Sheet for Patients: BloggerCourse.com  Fact Sheet for Healthcare Providers: SeriousBroker.it  This test is not yet approved or cleared by the [place] FDA and has been authorized for detection and/or diagnosis of SARS-CoV-2 by FDA under an Emergency Use Authorization (EUA). This EUA will remain in effect (meaning this test can be used) for the duration of the COVID-19 declaration under Section 564(b)(1) of the Act, 21 U.S.C. section 360bbb-3(b)(1), unless the authorization is terminated or revoked.     Resp Syncytial Virus by PCR NEGATIVE NEGATIVE Final    Comment: (NOTE) Fact Sheet for Patients: BloggerCourse.com  Fact Sheet for Healthcare Providers: SeriousBroker.it  This test is not yet approved  or cleared by the [place]  FDA and has been authorized for detection and/or diagnosis of SARS-CoV-2 by FDA under an Emergency Use Authorization (EUA). This EUA will remain in effect (meaning this test can be used) for the duration of the COVID-19 declaration under Section 564(b)(1) of the Act, 21 U.S.C. section 360bbb-3(b)(1), unless the authorization is terminated or revoked.  Performed at [facility], [address] [place], [place] [address]   Blood Culture (routine x 2)     Status: None (Preliminary result)   Collection Time: 09/14/22  7:10 PM   Specimen: BLOOD  Result Value Ref Range Status   Specimen Description   Final    BLOOD BLOOD RIGHT WRIST Performed at [facility], [address] [place], [place] [address]    Special Requests   Final    BOTTLES DRAWN AEROBIC AND ANAEROBIC Blood Culture results may not be optimal due to an inadequate volume of blood received in culture bottles Performed at [facility], [address], [place], [place] [address]    Culture  Setup Time   Final    GRAM POSITIVE COCCI IN BOTH AEROBIC AND ANAEROBIC BOTTLES CRITICAL RESULT CALLED TO, READ BACK BY AND VERIFIED WITH: Albertine Grates 09/15/22 0715 MW Performed at [facility], [address], [place], [place] [address]    Culture GRAM POSITIVE COCCI  Final   Report Status PENDING  Incomplete  Blood Culture ID Panel (Reflexed)     Status: Abnormal   Collection Time: 09/14/22  7:10 PM  Result Value Ref Range Status   Enterococcus faecalis NOT DETECTED NOT DETECTED Final   Enterococcus Faecium NOT DETECTED NOT DETECTED Final   Listeria monocytogenes NOT DETECTED NOT DETECTED Final   Staphylococcus species NOT DETECTED NOT DETECTED Final   Staphylococcus aureus (BCID) NOT DETECTED NOT DETECTED Final   Staphylococcus epidermidis NOT DETECTED NOT DETECTED Final   Staphylococcus lugdunensis NOT DETECTED NOT DETECTED Final   Streptococcus species  DETECTED (A) NOT DETECTED Final    Comment: Not Enterococcus species, Streptococcus agalactiae, Streptococcus pyogenes, or Streptococcus pneumoniae. CRITICAL RESULT CALLED TO, READ BACK BY AND VERIFIED WITH: TRAY GREENWOOD 09/15/22 0715 MW    Streptococcus agalactiae NOT DETECTED NOT DETECTED Final   Streptococcus pneumoniae NOT DETECTED NOT DETECTED Final   Streptococcus pyogenes NOT DETECTED NOT DETECTED Final   A.calcoaceticus-baumannii NOT DETECTED NOT DETECTED Final   Bacteroides fragilis NOT DETECTED NOT DETECTED Final   Enterobacterales NOT DETECTED NOT DETECTED Final   Enterobacter cloacae complex NOT DETECTED NOT DETECTED Final   Escherichia coli NOT DETECTED NOT DETECTED Final   Klebsiella aerogenes NOT DETECTED NOT DETECTED Final   Klebsiella oxytoca NOT DETECTED NOT DETECTED Final   Klebsiella pneumoniae NOT DETECTED NOT DETECTED Final   Proteus species NOT DETECTED NOT DETECTED Final   Salmonella species NOT DETECTED NOT DETECTED Final   Serratia marcescens NOT DETECTED NOT DETECTED Final   Haemophilus influenzae NOT DETECTED NOT DETECTED Final   Neisseria meningitidis NOT DETECTED NOT DETECTED Final   Pseudomonas aeruginosa NOT DETECTED NOT DETECTED Final   Stenotrophomonas maltophilia NOT DETECTED NOT DETECTED Final   Candida albicans NOT DETECTED NOT DETECTED Final   Candida auris NOT DETECTED NOT DETECTED Final   Candida glabrata NOT DETECTED NOT DETECTED Final   Candida krusei NOT DETECTED NOT DETECTED Final   Candida parapsilosis NOT DETECTED NOT DETECTED Final   Candida tropicalis NOT DETECTED NOT DETECTED Final   Cryptococcus neoformans/gattii NOT DETECTED NOT DETECTED Final    Comment: Performed at [employer]  [facility], [address] [place], [place] [address]  Blood Culture (routine x 2)     Status: None (Preliminary result)   Collection Time: 09/14/22  7:15 PM   Specimen: BLOOD  Result Value Ref Range Status   Specimen Description   Final    BLOOD RIGHT  ANTECUBITAL Performed at [facility], [address], [place], [place] [address]    Special Requests   Final    BOTTLES DRAWN AEROBIC AND ANAEROBIC Blood Culture results may not be optimal due to an inadequate volume of blood received in culture bottles Performed at [facility], [address], [place], [place] [address]    Culture  Setup Time   Final    GRAM POSITIVE COCCI IN BOTH AEROBIC AND ANAEROBIC BOTTLES CRITICAL RESULT CALLED TO, READ BACK BY AND VERIFIED WITH: TRAY GREENWOOD 09/15/22 0715 MW CRITICAL VALUE NOTED.  VALUE IS CONSISTENT WITH PREVIOUSLY REPORTED AND CALLED VALUE. Performed at [facility], [address] [place], [place] [address]    Culture GRAM POSITIVE COCCI  Final   Report Status PENDING  Incomplete      Radiology Studies last 3 days: CT CHEST ABDOMEN PELVIS W CONTRAST  Result Date: 09/14/2022 CLINICAL DATA:  Sepsis. Pt with c/o lethargy after dialysis today. Pt received full tx at dialysis. Hx of COPD, wears 02 at 2L via [place]. EMS note pt to be 76% upon arrival to scene. EXAM: CT CHEST, ABDOMEN, AND PELVIS WITH CONTRAST TECHNIQUE: Multidetector CT imaging of the chest, abdomen and pelvis was performed following the standard protocol during bolus administration of intravenous contrast. RADIATION DOSE REDUCTION: This exam was performed according to the departmental dose-optimization program which includes automated exposure control, adjustment of the mA and/or kV according to patient size and/or use of iterative reconstruction technique. CONTRAST:  OMNIPAQUE IOHEXOL 300 MG/ML  SOLN COMPARISON:  CT chest 06/19/2022 FINDINGS: CT CHEST FINDINGS Cardiovascular: Enlarged heart size. Small volume pericardial effusion. The thoracic aorta is normal in caliber. Mild atherosclerotic plaque of the thoracic aorta. Four vessel coronary artery calcifications. The main pulmonary artery is borderline enlarged measuring up to 3.1 cm. No central pulmonary  embolus. Mediastinum/Nodes: No enlarged mediastinal, hilar, or axillary lymph nodes. Thyroid gland, trachea, and esophagus demonstrate no significant findings. Lungs/Pleura: Interlobular septal wall thickening. Bibasilar passive atelectasis. Lingular ground-glass airspace opacity likely atelectasis. No focal consolidation. No pulmonary nodule. No pulmonary mass. Bilateral trace, left greater than right, pleural effusions. No pneumothorax. Musculoskeletal: No chest wall abnormality.  Bilateral gynecomastia. No suspicious lytic or blastic osseous lesions. No acute displaced fracture. Old healed sternal fracture. CT ABDOMEN PELVIS FINDINGS Hepatobiliary: No focal liver abnormality. No gallstones, gallbladder wall thickening, or pericholecystic fluid. No biliary dilatation. Pancreas: No focal lesion. Normal pancreatic contour. No surrounding inflammatory changes. No main pancreatic ductal dilatation. Spleen: Normal in size without focal abnormality. Adrenals/Urinary Tract: Bilateral adrenal gland hyperplasia with a poorly visualized 2 cm left adrenal gland nodule demonstrating a density of 7 Hounsfield units-consistent with a lipid rich adenoma, no further follow-up indicated. Bilateral renal cortical scarring. Bilateral kidneys enhance symmetrically. Fluid density lesions likely represent simple renal cysts. Simple renal cysts, in the absence of clinically indicated signs/symptoms, require no independent follow-up. No hydronephrosis. No hydroureter. The urinary bladder is unremarkable. No excretion of intravenous contrast on delayed imaging. Stomach/Bowel: Stomach is within normal limits. No evidence of bowel wall thickening or dilatation. Appendix appears normal. Vascular/Lymphatic: No abdominal aorta or iliac aneurysm. At least moderate atherosclerotic plaque of  the aorta and its branches. No abdominal, pelvic, or inguinal lymphadenopathy. Reproductive: Prostate is unremarkable. Other: At least small volume simple  free fluid within the abdomen pelvis. No intraperitoneal free gas. No organized fluid collection. Musculoskeletal: No abdominal wall hernia or abnormality. No suspicious lytic or blastic osseous lesions. No acute displaced fracture. IMPRESSION: 1. Findings of volume overload. 2. Mild pulmonary edema with bilateral trace, left greater than right, pleural effusions. Associated bilateral lower lobe and lingular atelectasis. Superimposed infection not excluded. 3. Cardiomegaly. 4. Small volume pericardial effusion. 5. At least small volume simple free fluid ascites. 6. Anasarca. 7. Aortic Atherosclerosis (ICD10-I70.0) including four-vessel coronary calcification. 8. No excretion of intravenous contrast on delayed imaging. Correlate with creatinine levels. Electronically Signed   By: Tish Frederickson M.D.   On: 09/14/2022 21:25   DG Chest Port 1 View  Result Date: 09/14/2022 CLINICAL DATA:  Questionable sepsis - evaluate for abnormality Lethargy after dialysis today. EXAM: PORTABLE CHEST 1 VIEW COMPARISON:  Radiograph and CT 06/19/2022 FINDINGS: The previous right-sided dialysis catheter is been removed. Cardiomegaly is unchanged. Mediastinal contours are unchanged. Small bilateral pleural effusions which have increased from prior exam. Diffuse interstitial thickening may represent pulmonary edema superimposed on emphysema. No pneumothorax. IMPRESSION: Unchanged cardiomegaly. Small bilateral pleural effusions have increased from prior exam. Diffuse interstitial thickening may represent pulmonary edema superimposed on emphysema. Overall findings suggest fluid overload/CHF. Electronically Signed   By: Narda Rutherford M.D.   On: 09/14/2022 19:43             LOS: 2 days        Sunnie Nielsen, DO [facility] 09/16/2022, 4:04 PM    Dictation software may have been used to generate the above note. Typos may occur and escape review in typed/dictated notes. Please contact Dr Lyn Hollingshead directly  for clarity if needed.  Staff may message me via secure chat in Epic  but this may not receive an immediate response,  please page me for urgent matters!  If 7PM-7AM, please contact night coverage [email or web address]

## 2022-09-16 NOTE — Evaluation (Signed)
Physical Therapy Evaluation Patient Details Name: DIMARCO MINKIN MRN: [id number] DOB: May 12, 1961 Today's Date: 09/16/2022  History of Present Illness  Pt is a 62 y.o. 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 MD assessment includes: severe sepsis, community-acquired pneumonia, streptococcus bacteremia, acute on chronic respiratory failure with hypoxia, acute metabolic encephalopathy, acute on chronic diastolic dysfunction CHF, and anasarca.   Clinical Impression  Pt was pleasant and put forth good effort throughout the session. Pt required extensive multi-modal cues to follow most simple 1-step commands along with extra time for processing.  Once pt initiated movement he was able to perform below functional tasks without physical assistance and with no adverse symptoms noted.  Pt was was able to amb 8 feet x 1 and then 4 feet x 1 with fair stability with cues for safe sequencing with the RW.  Pt will benefit from continued PT services upon discharge to safely address deficits listed in patient problem list for decreased caregiver assistance and eventual return to PLOF.          Recommendations for follow up therapy are one component of a multi-disciplinary discharge planning process, led by the attending physician.  Recommendations may be updated based on patient status, additional functional criteria and insurance authorization.  Follow Up Recommendations       Assistance Recommended at Discharge Frequent or constant Supervision/Assistance  Patient can return home with the following  A little help with walking and/or transfers;A little help with bathing/dressing/bathroom;Assistance with  cooking/housework;Direct supervision/assist for medications management;Help with stairs or ramp for entrance;Assist for transportation    Equipment Recommendations None recommended by PT  Recommendations for Other Services       Functional Status Assessment Patient has had a recent decline in their functional status and demonstrates the ability to make significant improvements in function in a reasonable and predictable amount of time.     Precautions / Restrictions Precautions Precautions: Fall Restrictions Weight Bearing Restrictions: No      Mobility  Bed Mobility Overal bed mobility: Needs Assistance Bed Mobility: Supine to Sit     Supine to sit: Min assist     General bed mobility comments: Min A perhaps more for tactile cuing than for actual physical assist, difficult to assess secondary to pt cognition    Transfers Overall transfer level: Needs assistance Equipment used: Rolling walker (2 wheels) Transfers: Sit to/from Stand Sit to Stand: From elevated surface, Min guard           General transfer comment: Heavy verbal and tactile cuing to come to standing but pt able to stand without physical assist    Ambulation/Gait Ambulation/Gait assistance: Min guard Gait Distance (Feet): 8 Feet x 1, 4 Feet x 1 Assistive device: Rolling walker (2 wheels) Gait Pattern/deviations: Step-through pattern, Decreased step length - right, Decreased step length - left, Trunk flexed Gait velocity: decreased     General Gait Details: Slow cadence but steady without LOB with mod verbal and tactile cues to initiate movement and for general sequencing  Stairs            Wheelchair Mobility    Modified Rankin (Stroke Patients Only)       Balance Overall balance assessment: Needs assistance Sitting-balance support: Feet supported Sitting balance-[name]:  Good     Standing balance support: Bilateral upper extremity supported, During functional activity, Reliant  on assistive device for balance Standing balance-[name]: Fair                               Pertinent Vitals/Pain Pain Assessment Pain Assessment: No/denies pain    Home Living Family/patient expects to be discharged to:: Assisted living                   Additional Comments: Per chart review and confirmed by pt: pt lives at [facility]    Prior Function Prior Level of Function : Patient poor historian/Family not available             Mobility Comments: Pt stated that he walks with a "walker" at his ALF but unable to state distances or with how much assistance, unkown fall history ADLs Comments: Pt stated that staff assists him with ADLs     Hand Dominance   Dominant Hand: Right    Extremity/Trunk Assessment   Upper Extremity Assessment Upper Extremity Assessment: Generalized weakness    Lower Extremity Assessment Lower Extremity Assessment: Generalized weakness       Communication   Communication: HOH  Cognition Arousal/Alertness: Awake/alert Behavior During Therapy: Flat affect Overall Cognitive Status: No family/caregiver present to determine baseline cognitive functioning                                 General Comments: Pt alert to self and able to follow most simple 1-step commands with extra time and multi-modal cuing        General Comments      Exercises     Assessment/Plan    PT Assessment Patient needs continued PT services  PT Problem List Decreased strength;Decreased activity tolerance;Decreased balance;Decreased mobility;Decreased knowledge of use of DME       PT Treatment Interventions DME instruction;Gait training;Therapeutic activities;Functional mobility training;Therapeutic exercise;Balance training;Patient/family education    PT Goals (Current goals can be found in the Care Plan section)  Acute Rehab PT Goals Patient Stated Goal: to return home PT Goal Formulation: With patient Time  For Goal Achievement: 09/29/22 Potential to Achieve Goals: Good    Frequency Min 3X/week     Co-evaluation               AM-PAC PT "6 Clicks" Mobility  Outcome Measure Help needed turning from your back to your side while in a flat bed without using bedrails?: A Little Help needed moving from lying on your back to sitting on the side of a flat bed without using bedrails?: A Little Help needed moving to and from a bed to a chair (including a wheelchair)?: A Little Help needed standing up from a chair using your arms (e.g., wheelchair or bedside chair)?: A Little Help needed to walk in hospital room?: A Little Help needed climbing 3-5 steps with a railing? : A Lot 6 Click Score: 17    End of Session Equipment Utilized During Treatment: Gait belt;Oxygen Activity Tolerance: Patient tolerated treatment well Patient left: in chair;with call bell/phone within reach;with nursing/sitter in room Nurse Communication: Mobility status PT Visit Diagnosis: Difficulty in walking, not elsewhere classified (R26.2);Muscle weakness (generalized) (M62.81)    Time: 1610-9604 PT Time Calculation (min) (ACUTE ONLY): 22 min   Charges:   PT Evaluation $PT Eval Moderate Complexity: 1  Mod         DElly Modena PT, DPT 09/16/22, 5:10 PM

## 2022-09-16 NOTE — Hospital Course (Addendum)
Todd Brown is a 62 y.o. 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  Patient was in his usual state of health earlier and went to and completed his dialysis session but on arrival home he was lethargic.  On arrival of EMS he was saturating in the 70s on room air and was placed on NRB for transport. Of note: bacteremia secondary to strep gallolyticus in 01/24. Suspected source was from the PermCath (PermCath was removed during that hospitalization). Standard ECHO was obtained which did not show any vegetation.  ID was consulted and they requested TEE and colonscopy, however, the pt did not agree to have these procedures. Pt was continued on IV cefazolin 2g MWF with dialysis for strep gallolyticus bacteremia (END DATE Jul 20 2022).   04/15: ED course and data review: Tmax 104.2 with pulse 107 respirations 23 and O2 sat initially 89% on NRB improving to 94% on HFNC at 6 L.  BP was 154/86 on arrival.  VBG on HFNC showed pH 7.5 with pCO2 40.  Labs otherwise significant for WBC 12,100, lactic acid 0.9 and procalcitonin 0.72.  Respiratory viral panel negative for COVID flu and RSV.  Troponin 81. Patient started on cefepime vancomycin and Flagyl for sepsis of unknown source. Hospitalist consulted for admission.  04/16: Blood culture revealed strep species. Discontinued cefepime, vancomycin and Flagyl and place patient on Rocephin 2 g IV daily. Consulted ID - recs for TEE and colonoscopy, AV graft evauation. Patient did not receive IV fluid resuscitation due to fluid overload. Removed 2L in HD 04/17: dialysis today - examined afterward. Pt has no complaints other  than wants something to eat. Removed another 2L in HD.  04/18: cardiology consulted for TEE - await improvement in volume status, TEE will have to be w/ [facility] not [facility] d/t staffing. Repeating BCx. Will d/w guardian re: consent for TEE - she states he was amenable to TEE but did not want colonoscopy.  04/19: stable  Consultants:  Nephrology  Infectious Disease  Cardiology   Procedures: None       ASSESSMENT & PLAN:   Principal Problem:   Acute on chronic respiratory failure with hypoxia Active Problems:   Severe sepsis versus SIRS   CAP (community acquired pneumonia)   Anasarca (mild pulmonary edema, pleural and pericardial effusion, ascites on CT)   Acute metabolic encephalopathy   ESRD on dialysis   Essential hypertension   COPD (chronic obstructive pulmonary disease)   Schizophrenia   Essential hypertension, benign   Bacteremia   Acute on chronic respiratory failure  Severe sepsis  Community-acquired pneumonia  Streptococcus bacteremia.   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  Organ dysfunction of respiratory failure and  altered mental status. CT chest abdomen and pelvis consistent with anasarca.  Cannot rule out superimposed pulmonary infection Blood culture revealed strep species Continue Rocephin 2 g IV daily Consult ID --> will need TEE and colonoscopy, evaluate graft  Patient did not receive IV fluid resuscitation due to fluid overload, conitnue w/ HD Per his guardian, he was amenable to TEE last time but not to colonoscopy, will revisit this w/ him -  he seems amenable now and guardian plan on signing consent  Goals of care Patient does not demonstrate adequate understanding of his medical illness(es), he can not make an informed decision about complex treatments - consent or refusal.  Given frailty and multiple comorbidities, he is  less likely to survive resuscitation measures in the event of a cardiopulmonary arrest but I am not convinced at this time that resuscitation would be utterly futile, either. He has no terminal illness at this time which would recommend strongly for DNR/DNI status, especially since he has not voiced that he would NOT desire CPR or life support measures  guardian will have to give consent/refusal for TEE or other procedures  Consider palliative care consult pending clinical course   Acute on chronic respiratory failure with hypoxia - improving Etiology acute on chronic diastolic CHF and CAP  Continue high flow nasal cannula to maintain sats over 92% and wean as tolerated Expect improvement in oxygenation following resolution of acute illness  Acute metabolic encephalopathy Secondary to sepsis from Streptococcus bacteremia Patient is oriented to person and place but not to time Monitor patient closely    Acute on chronic diastolic dysfunction CHF Anasarca  EF 06/2022 showing EF 60 to 65% with grade 1 diastolic dysfunction CT with findings of volume overload, mild pulmonary edema with pleural effusions, small volume pericardial effusion and anasarca and small volume fluid ascites Nephrology consult for management of fluid status   ESRD on dialysis Nephrology consult for renal replacement therapy   Essential hypertension Restart home meds    COPD (chronic obstructive pulmonary disease) Continue home inhalers DuoNebs as needed   Schizophrenia Continue Prolixin and carbamazepine  Vision impairment Precautions for fall   DVT prophylaxis: heparin Pertinent IV fluids/nutrition: no continuous IV fluids, cardiac diet  Central lines / invasive devices: none  Code Status: FULL CODE ACP documentation reviewed: none on file   Current Admission Status: inpatient  TOC needs / Dispo plan: HH PT Barriers to discharge / significant pending items: bacteremia, pend repeat cultures, ID recs,  fluid overload, TEE, IV abx, will be here several more days pending results     Lenn Sink - guardian [phone] = fax

## 2022-09-17 ENCOUNTER — Inpatient Hospital Stay (HOSPITAL_COMMUNITY)
Admit: 2022-09-17 | Discharge: 2022-09-17 | Disposition: A | Discharge status: Home / Self Care | Payer: Medicaid Other | Attending: Infectious Diseases | Admitting: Infectious Diseases

## 2022-09-17 DIAGNOSIS — B955 Unspecified streptococcus as the cause of diseases classified elsewhere: Secondary | ICD-10-CM | POA: Diagnosis not present

## 2022-09-17 DIAGNOSIS — R7881 Bacteremia: Secondary | ICD-10-CM | POA: Diagnosis not present

## 2022-09-17 DIAGNOSIS — Z992 Dependence on renal dialysis: Secondary | ICD-10-CM

## 2022-09-17 DIAGNOSIS — N186 End stage renal disease: Secondary | ICD-10-CM | POA: Diagnosis not present

## 2022-09-17 DIAGNOSIS — G9341 Metabolic encephalopathy: Secondary | ICD-10-CM | POA: Diagnosis not present

## 2022-09-17 LAB — BASIC METABOLIC PANEL
Anion gap: 11 (ref 5–15)
BUN: 29 mg/dL — ABNORMAL HIGH (ref 8–23)
CO2: 29 mmol/L (ref 22–32)
Calcium: 8.7 mg/dL — ABNORMAL LOW (ref 8.9–10.3)
Chloride: 96 mmol/L — ABNORMAL LOW (ref 98–111)
Creatinine, Ser: 5.16 mg/dL — ABNORMAL HIGH (ref 0.61–1.24)
GFR, Estimated: 12 mL/min — ABNORMAL LOW (ref >60)
Glucose, Bld: 89 mg/dL (ref 70–99)
Potassium: 3.9 mmol/L (ref 3.5–5.1)
Sodium: 136 mmol/L (ref 135–145)

## 2022-09-17 LAB — ECHOCARDIOGRAM COMPLETE
AR max vel: 4.67 cm2
AV Area VTI: 4.39 cm2
AV Area mean vel: 4.5 cm2
AV Mean grad: 4 mmHg
AV Peak grad: 8.1 mmHg
Ao pk vel: 1.42 m/s
Area-P 1/2: 2.02 cm2
Height: 71 in
MV VTI: 3.34 cm2
P 1/2 time: 603 msec
S' Lateral: 2.9 cm
Weight: 2800 oz

## 2022-09-17 LAB — CBC
HCT: 35.2 % — ABNORMAL LOW (ref 39.0–52.0)
Hemoglobin: 11.6 g/dL — ABNORMAL LOW (ref 13.0–17.0)
MCH: 33.5 pg (ref 26.0–34.0)
MCHC: 33 g/dL (ref 30.0–36.0)
MCV: 101.7 fL — ABNORMAL HIGH (ref 80.0–100.0)
Platelets: 112 10*3/uL — ABNORMAL LOW (ref 150–400)
RBC: 3.46 MIL/uL — ABNORMAL LOW (ref 4.22–5.81)
RDW: 14.3 % (ref 11.5–15.5)
WBC: 5.1 10*3/uL (ref 4.0–10.5)
nRBC: 0 % (ref 0.0–0.2)

## 2022-09-17 LAB — CULTURE, BLOOD (ROUTINE X 2)

## 2022-09-17 MED ORDER — RENA-VITE PO TABS
1.0000 | ORAL_TABLET | Freq: Every day | ORAL | Status: DC
  Administered 2022-09-17 – 2022-09-23 (×7): 1 via ORAL
  Filled 2022-09-17 (×7): qty 1

## 2022-09-17 MED ORDER — ISOSORBIDE MONONITRATE ER 30 MG PO TB24
60.0000 mg | ORAL_TABLET | Freq: Two times a day (BID) | ORAL | Status: DC
  Administered 2022-09-17 – 2022-09-24 (×14): 60 mg via ORAL
  Filled 2022-09-17 (×13): qty 2

## 2022-09-17 MED ORDER — POLYETHYLENE GLYCOL 3350 17 G PO PACK
17.0000 g | PACK | Freq: Every day | ORAL | Status: DC | PRN
  Administered 2022-09-19 – 2022-09-23 (×3): 17 g via ORAL
  Filled 2022-09-17 (×3): qty 1

## 2022-09-17 MED ORDER — AMLODIPINE BESYLATE 10 MG PO TABS
10.0000 mg | ORAL_TABLET | Freq: Every morning | ORAL | Status: DC
  Administered 2022-09-17 – 2022-09-24 (×8): 10 mg via ORAL
  Filled 2022-09-17 (×8): qty 1

## 2022-09-17 MED ORDER — ALBUTEROL SULFATE (2.5 MG/3ML) 0.083% IN NEBU: 3.0000 mL | INHALATION_SOLUTION | RESPIRATORY_TRACT | Status: DC | PRN

## 2022-09-17 MED ORDER — CLONIDINE HCL 0.2 MG/24HR TD PTWK
0.2000 mg | MEDICATED_PATCH | TRANSDERMAL | Status: DC
  Administered 2022-09-17 – 2022-09-24 (×2): 0.2 mg via TRANSDERMAL
  Filled 2022-09-17 (×2): qty 1

## 2022-09-17 MED ORDER — ZINC SULFATE 220 (50 ZN) MG PO CAPS
220.0000 mg | ORAL_CAPSULE | Freq: Every day | ORAL | Status: DC
  Administered 2022-09-17 – 2022-09-24 (×8): 220 mg via ORAL
  Filled 2022-09-17 (×8): qty 1

## 2022-09-17 MED ORDER — FERROUS SULFATE 325 (65 FE) MG PO TABS
325.0000 mg | ORAL_TABLET | Freq: Every day | ORAL | Status: DC
  Administered 2022-09-17 – 2022-09-24 (×7): 325 mg via ORAL
  Filled 2022-09-17 (×7): qty 1

## 2022-09-17 MED ORDER — VITAMIN D 25 MCG (1000 UNIT) PO TABS
1000.0000 [IU] | ORAL_TABLET | Freq: Every morning | ORAL | Status: DC
  Administered 2022-09-17 – 2022-09-19 (×3): 1000 [IU] via ORAL
  Filled 2022-09-17 (×3): qty 1

## 2022-09-17 MED ORDER — FLUTICASONE FUROATE-VILANTEROL 200-25 MCG/ACT IN AEPB
1.0000 | INHALATION_SPRAY | Freq: Every day | RESPIRATORY_TRACT | Status: DC
  Administered 2022-09-17 – 2022-09-24 (×8): 1 via RESPIRATORY_TRACT
  Filled 2022-09-17: qty 28

## 2022-09-17 NOTE — Progress Notes (Signed)
Date of Admission:  09/14/2022    ID: Todd Brown is a 62 y.o. male  Principal Problem:   Acute on chronic respiratory failure with hypoxia Active Problems:   Schizophrenia   Essential hypertension, benign   Severe sepsis versus SIRS   COPD (chronic obstructive pulmonary disease)   Acute metabolic encephalopathy   Essential hypertension   ESRD on dialysis   Bacteremia   CAP (community acquired pneumonia)   Anasarca (mild pulmonary edema, pleural and pericardial effusion, ascites on CT)   Acute on chronic respiratory failure    Subjective: Appears lethargic but responds to questions   Medications:   amLODipine  10 mg Oral q morning   calcium acetate  1,334 mg Oral TID WC   carbamazepine  1,000 mg Oral q morning   Chlorhexidine Gluconate Cloth  6 each Topical Q0600   cholecalciferol  1,000 Units Oral q AM   cloNIDine  0.2 mg Transdermal Weekly   ferrous sulfate  325 mg Oral Daily   fluPHENAZine  2.5 mg Oral Daily   fluticasone furoate-vilanterol  1 puff Inhalation Daily   heparin  5,000 Units Subcutaneous Q8H   isosorbide mononitrate  60 mg Oral BID   levothyroxine  50 mcg Oral Q0600   multivitamin  1 tablet Oral QHS   pantoprazole  40 mg Oral Daily   trihexyphenidyl  2 mg Oral BID   zinc sulfate  220 mg Oral Daily    Objective: Vital signs in last 24 hours: Patient Vitals for the past 24 hrs:  BP Temp Temp src Pulse Resp SpO2  09/17/22 1935 (!) 156/84 98.4 F (36.9 C) -- (!) 58 16 100 %  09/17/22 1822 135/75 98.5 F (36.9 C) Oral 84 17 95 %  09/17/22 0733 (!) 163/89 99 F (37.2 C) Oral 61 18 99 %  09/17/22 0600 -- -- -- -- -- 98 %  09/17/22 0338 (!) 144/92 97.9 F (36.6 C) -- (!) 58 18 100 %       PHYSICAL EXAM:  General: lethargic- oriented in person Lungs: b/la air entry Heart: Regular rate and rhythm, no murmur, rub or gallop. Abdomen: Soft, non-tender,not distended. Bowel sounds normal. No masses Extremities: Left AVG Edema legs Skin: No  rashes or lesions. Or bruising Lymph: Cervical, supraclavicular normal. Neurologic: Grossly non-focal  Lab Results    Latest Ref Rng & Units 09/17/2022    4:48 AM 09/15/2022    7:52 AM 09/14/2022    7:10 PM  CBC  WBC 4.0 - 10.5 K/uL 5.1  12.9  12.1   Hemoglobin 13.0 - 17.0 g/dL 16.1  09.6  04.5   Hematocrit 39.0 - 52.0 % 35.2  40.2  40.0   Platelets 150 - 400 K/uL 112  186  183        Latest Ref Rng & Units 09/17/2022    4:48 AM 09/15/2022   11:23 AM 09/14/2022    7:10 PM  CMP  Glucose 70 - 99 mg/dL 89  98  80   BUN 8 - 23 mg/dL 29  44  34   Creatinine 0.61 - 1.24 mg/dL 4.09  8.11  9.14   Sodium 135 - 145 mmol/L 136  133  138   Potassium 3.5 - 5.1 mmol/L 3.9  4.7  4.5   Chloride 98 - 111 mmol/L 96  94  95   CO2 22 - 32 mmol/L 29  27  29    Calcium 8.9 - 10.3 mg/dL 8.7  9.1  9.2   Total Protein 6.5 - 8.1 g/dL   7.3   Total Bilirubin 0.3 - 1.2 mg/dL   1.0   Alkaline Phos 38 - 126 U/L   102   AST 15 - 41 U/L   31   ALT 0 - 44 U/L   17       Microbiology: [facility]- streptococcus Studies/Results: ECHOCARDIOGRAM COMPLETE  Result Date: 09/17/2022    ECHOCARDIOGRAM REPORT   Patient Name:   Todd Brown Date of Exam: 09/17/2022 Medical Rec #:  [id number]        Height:       71.0 in Accession #:    [id number]       Weight:       175.0 lb Date of Birth:  01-13-1961        BSA:          1.992 m Patient Age:    61 years         BP:           163/89 mmHg Patient Gender: M                HR:           64 bpm. Exam Location:  [facility] Procedure: 2D Echo, Cardiac Doppler and Color Doppler Indications:     Bacteremia  History:         Patient has prior history of Echocardiogram examinations, most                  recent 06/20/2022. Previous Myocardial Infarction, COPD,                  Signs/Symptoms:Bacteremia and Altered Mental Status; Risk                  Factors:Hypertension. ESRD on Dialysis.  Sonographer:     Mikki Harbor Referring Phys:  [id number] Lynn Ito Diagnosing Phys: Julien Nordmann MD  Sonographer Comments: Image acquisition challenging due to uncooperative patient. IMPRESSIONS  1. No valve vegetation noted. Leaflets well visualized.  2. Left ventricular ejection fraction, by estimation, is 50 to 55%. The left ventricle has low normal function. The left ventricle has no regional wall motion abnormalities. There is severe left ventricular hypertrophy. Left ventricular diastolic parameters are consistent with Grade I diastolic dysfunction (impaired relaxation). There is the interventricular septum is flattened in systole and diastole, consistent with right ventricular pressure and volume overload.  3. Right ventricular systolic function is moderately reduced. The right ventricular size is moderately enlarged. Moderately increased right ventricular wall thickness. There is moderately elevated pulmonary artery systolic pressure. The estimated right ventricular systolic pressure is 59.1 mmHg.  4. Left atrial size was severely dilated.  5. Right atrial size was severely dilated.  6. The mitral valve is normal in structure. No evidence of mitral valve regurgitation. No evidence of mitral stenosis.  7. Tricuspid valve regurgitation is moderate to severe.  8. The aortic valve is normal in structure. Aortic valve regurgitation is mild. Aortic valve sclerosis is present, with no evidence of aortic valve stenosis.  9. There is mild dilatation of the aortic root, measuring 41 mm. 10. The inferior vena cava is normal in size with greater than 50% respiratory variability, suggesting right atrial pressure of 3 mmHg. FINDINGS  Left Ventricle: Left ventricular ejection fraction, by estimation, is 50 to 55%. The left ventricle has low normal function. The left ventricle has no regional wall motion abnormalities. The left ventricular  internal cavity size was normal in size. There is severe left ventricular hypertrophy. The interventricular septum is flattened in systole and diastole, consistent with right  ventricular pressure and volume overload. Left ventricular diastolic parameters are consistent with Grade I diastolic dysfunction (impaired relaxation). Right Ventricle: The right ventricular size is moderately enlarged. Moderately increased right ventricular wall thickness. Right ventricular systolic function is moderately reduced. There is moderately elevated pulmonary artery systolic pressure. The tricuspid regurgitant velocity is 3.32 m/s, and with an assumed right atrial pressure of 15 mmHg, the estimated right ventricular systolic pressure is 59.1 mmHg. Left Atrium: Left atrial size was severely dilated. Right Atrium: Right atrial size was severely dilated. Pericardium: Trivial pericardial effusion is present. Mitral Valve: The mitral valve is normal in structure. No evidence of mitral valve regurgitation. No evidence of mitral valve stenosis. MV peak gradient, 3.3 mmHg. The mean mitral valve gradient is 1.0 mmHg. There is no evidence of mitral valve vegetation. Tricuspid Valve: The tricuspid valve is normal in structure. Tricuspid valve regurgitation is moderate to severe. No evidence of tricuspid stenosis. There is no evidence of tricuspid valve vegetation. Aortic Valve: The aortic valve is normal in structure. Aortic valve regurgitation is mild. Aortic regurgitation PHT measures 603 msec. Aortic valve sclerosis is present, with no evidence of aortic valve stenosis. Aortic valve mean gradient measures 4.0 mmHg. Aortic valve peak gradient measures 8.1 mmHg. Aortic valve area, by VTI measures 4.39 cm. There is no evidence of aortic valve vegetation. Pulmonic Valve: The pulmonic valve was normal in structure. Pulmonic valve regurgitation is mild to moderate. No evidence of pulmonic stenosis. There is no evidence of pulmonic valve vegetation. Aorta: The aortic root is normal in size and structure. There is mild dilatation of the aortic root, measuring 41 mm. Venous: The inferior vena cava is normal in size with  greater than 50% respiratory variability, suggesting right atrial pressure of 3 mmHg. IAS/Shunts: No atrial level shunt detected by color flow Doppler.  LEFT VENTRICLE PLAX 2D LVIDd:         4.20 cm   Diastology LVIDs:         2.90 cm   LV e' medial:  9.79 cm/s LV PW:         1.70 cm   LV e' lateral: 8.70 cm/s LV IVS:        2.40 cm LVOT diam:     2.50 cm LV SV:         117 LV SV Index:   59 LVOT Area:     4.91 cm  RIGHT VENTRICLE RV Basal diam:  5.35 cm RV Mid diam:    4.50 cm RV S prime:     13.70 cm/s TAPSE (M-mode): 2.4 cm LEFT ATRIUM              Index        RIGHT ATRIUM           Index LA diam:        4.70 cm  2.36 cm/m   RA Area:     34.70 cm LA Vol (A2C):   119.0 ml 59.73 ml/m  RA Volume:   152.00 ml 76.30 ml/m LA Vol (A4C):   85.3 ml  42.82 ml/m LA Biplane Vol: 102.0 ml 51.20 ml/m  AORTIC VALVE                    PULMONIC VALVE AV Area (Vmax):    4.67 cm  PV Vmax:       0.91 m/s AV Area (Vmean):   4.50 cm     PV Peak grad:  3.3 mmHg AV Area (VTI):     4.39 cm AV Vmax:           142.00 cm/s AV Vmean:          85.400 cm/s AV VTI:            0.267 m AV Peak Grad:      8.1 mmHg AV Mean Grad:      4.0 mmHg LVOT Vmax:         135.00 cm/s LVOT Vmean:        78.300 cm/s LVOT VTI:          0.239 m LVOT/AV VTI ratio: 0.90 AI PHT:            603 msec  AORTA Ao Root diam: 4.10 cm Ao Asc diam:  3.20 cm MITRAL VALVE              TRICUSPID VALVE MV Area (PHT): 2.02 cm   TR Peak grad:   44.1 mmHg MV Area VTI:   3.34 cm   TR Vmax:        332.00 cm/s MV Peak grad:  3.3 mmHg MV Mean grad:  1.0 mmHg   SHUNTS MV Vmax:       0.91 m/s   Systemic VTI:  0.24 m MV Vmean:      55.5 cm/s  Systemic Diam: 2.50 cm Julien Nordmann MD Electronically signed by Julien Nordmann MD Signature Date/Time: 09/17/2022/4:38:57 PM    Final      Assessment/Plan: Encephalopathy secondary to infection, improved than admisison   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  He refused TEE at that time.  He was also recommended colonoscopy as this organism is associated with colon cancer.  But I do not think it was done So we need to get a TEE this admission Will also need colonoscopy Also need to look into the AV graft as it had been malfunctioning.  Need  limited WBC scan to look at the graft Patient is currently on  ceftriaxone.   Will need antibiotics for a long duration ( can do cefazolin on discharge during dialysis)   End-stage renal disease on dialysis   Hypertension:    Schizophrenia On fluphenazine Also on Tegretol and trihexyphenidyl   Hypothyroidism on levothyroxine   Blindness both eyes  ? Palliative consult to discuss treatment goals Dr.Comer covering Friday-Sunday- available by phone for urgent issues

## 2022-09-17 NOTE — Progress Notes (Signed)
*  PRELIMINARY RESULTS* Echocardiogram 2D Echocardiogram has been performed.  Carolyne Fiscal 09/17/2022, 4:20 PM

## 2022-09-17 NOTE — Evaluation (Signed)
Occupational Therapy Evaluation Patient Details Name: Todd Brown MRN: [id number] DOB: August 04, 1960 Today's Date: 09/17/2022   History of Present Illness Pt is a 62 y.o. 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 MD assessment includes: severe sepsis, community-acquired pneumonia, streptococcus bacteremia, acute on chronic respiratory failure with hypoxia, acute metabolic encephalopathy, acute on chronic diastolic dysfunction CHF, and anasarca.   Clinical Impression   Patient presenting with decreased Ind in self care,balance, functional mobility/transfers, endurance, and safety awareness. Patient reports living at [facility]. He has assistance from staff for self care tasks. He endorses short distance ambulation with RW but normally transfers into wheelchair and moves around facility by propelling himself.  Patient needing increased encouragement for participation this session and only performs bed mobility to EOB before declining further tasks. Pt is likely close to baseline but will be seen while in hospital in order to maintain.  Patient will benefit from acute OT to increase overall independence in the areas of ADLs, functional mobility, and safety awareness in order to safely discharge .     Recommendations for follow up therapy are one component of a multi-disciplinary discharge planning process, led by the attending physician.  Recommendations may be updated based on patient status, additional functional criteria and insurance authorization.   Assistance Recommended at Discharge Frequent or constant Supervision/Assistance  Patient can return home with the following A little help with walking  and/or transfers;A little help with bathing/dressing/bathroom;Assistance with cooking/housework;Assist for transportation;Help with stairs or ramp for entrance    Functional Status Assessment  Patient has had a recent decline in their functional status and demonstrates the ability to make significant improvements in function in a reasonable and predictable amount of time.  Equipment Recommendations  None recommended by OT       Precautions / Restrictions Precautions Precautions: Fall      Mobility Bed Mobility Overal bed mobility: Needs Assistance Bed Mobility: Supine to Sit, Sit to Supine     Supine to sit: Supervision Sit to supine: Supervision   General bed mobility comments: no physical assistance nut cuing for safety awareness    Transfers                   General transfer comment: pt refusal      Balance Overall balance assessment: Needs assistance Sitting-balance support: Feet supported Sitting balance-[name]: Good                                     ADL either performed or assessed with clinical judgement   ADL Overall ADL's : Needs assistance/impaired     Grooming: Wash/dry face;Wash/dry hands;Sitting;Set up;Supervision/safety                                 General ADL Comments: Pt refusing to stand or attempt functional transfers during session     Vision Baseline Vision/History: 2 Legally blind              Pertinent Vitals/Pain Pain Assessment Pain Assessment: No/denies pain     Hand Dominance Right   Extremity/Trunk Assessment Upper Extremity Assessment Upper  Extremity Assessment: Generalized weakness   Lower Extremity Assessment Lower Extremity Assessment: Generalized weakness       Communication Communication Communication: HOH   Cognition Arousal/Alertness: Awake/alert Behavior During Therapy: Flat affect Overall Cognitive Status: No family/caregiver present to determine baseline  cognitive functioning                                 General Comments: Pt alert to self and able to follow most simple 1-step commands with extra time and multi-modal cuing                Home Living Family/patient expects to be discharged to:: Assisted living                                 Additional Comments: Per chart review and confirmed by pt: pt lives at [facility]      Prior Functioning/Environment Prior Level of Function : Patient poor historian/Family not available             Mobility Comments: Pt reports staff assist him with short distance ambulation and into wheelchair that he propels around unit. ADLs Comments: Pt stated that staff assists him with ADLs        OT Problem List: Decreased strength;Decreased activity tolerance;Decreased safety awareness;Impaired balance (sitting and/or standing);Decreased knowledge of use of DME or AE      OT Treatment/Interventions: Self-care/ADL training;Therapeutic exercise;Therapeutic activities;Energy conservation;DME and/or AE instruction;Patient/family education;Balance training    OT Goals(Current goals can be found in the care plan section) Acute Rehab OT Goals Patient Stated Goal: to go back to [facility] Goal Formulation: With patient Time For Goal Achievement: 10/01/22 Potential to Achieve Goals: Fair ADL Goals Pt Will Perform Lower Body Dressing: with min guard assist;sit to/from stand Pt Will Transfer to Toilet: with min guard assist;ambulating Pt Will Perform Toileting - Clothing Manipulation and hygiene: with min guard assist;sit to/from stand  OT Frequency: Min 1X/week       AM-PAC OT "6 Clicks" Daily Activity     Outcome Measure Help from another person eating meals?: None Help from another person taking care of personal grooming?: A Little Help from another person toileting, which includes using toliet, bedpan, or urinal?: A Little Help from another person  bathing (including washing, rinsing, drying)?: A Little Help from another person to put on and taking off regular upper body clothing?: A Little Help from another person to put on and taking off regular lower body clothing?: A Little 6 Click Score: 19   End of Session Equipment Utilized During Treatment: Rolling walker (2 wheels) Nurse Communication: Mobility status  Activity Tolerance: Patient tolerated treatment well Patient left: in bed;with call bell/phone within reach;with bed alarm set  OT Visit Diagnosis: Unsteadiness on feet (R26.81);Muscle weakness (generalized) (M62.81)                Time: 1610-9604 OT Time Calculation (min): 17 min Charges:  OT General Charges $OT Visit: 1 Visit OT Evaluation $OT Eval Moderate Complexity: 1 Mod OT Treatments $Therapeutic Activity: 8-22 mins  Jackquline Denmark, MS, OTR/L , CBIS ascom [phone]  09/17/22, 12:47 PM

## 2022-09-17 NOTE — Progress Notes (Signed)
[facility]  ROUNDING NOTE   Subjective:   Todd Brown is a 62 year old male with past medical conditions including COPD, hypertension, DVT, schizophrenia, and end-stage renal disease on hemodialysis.  Patient presents to the emergency department with lethargy.   Patient is known to our practice and receives outpatient dialysis treatments at [facility] on a MWF schedule, supervised by Dr. Cherylann Ratel.   Patient seen sitting up in bed, alert and oriented Awaiting breakfast Denies any pain or discomfort at this time Remains on 3 L nasal cannula  Objective:  Vital signs in last 24 hours:  Temp:  [97.9 F (36.6 C)-99 F (37.2 C)] 99 F (37.2 C) (04/18 0733) Pulse Rate:  [57-64] 61 (04/18 0733) Resp:  [11-18] 18 (04/18 0733) BP: (106-163)/(69-92) 163/89 (04/18 0733) SpO2:  [98 %-100 %] 99 % (04/18 0733)  Weight change:  Filed Weights   09/14/22 1907  Weight: 79.4 kg    Intake/Output: I/O last 3 completed shifts: In: 100 [IV Piggyback:100] Out: -    Intake/Output this shift:  Total I/O In: 240 [P.O.:240] Out: -   Physical Exam: General: NAD  Head: Normocephalic, atraumatic. Moist oral mucosal membranes  Eyes: Anicteric  Lungs:  Diminished in bases, normal effort, [place] O2  Heart: Regular rate and rhythm  Abdomen:  Soft, nontender  Extremities:  No peripheral edema.  Neurologic: Alert, moving all four extremities  Skin: No lesions  Access: Lt avf    Basic Metabolic Panel: Recent Labs  Lab 09/14/22 1910 09/15/22 1123 09/17/22 0448  NA 138 133* 136  K 4.5 4.7 3.9  CL 95* 94* 96*  CO2 29 27 29   GLUCOSE 80 98 89  BUN 34* 44* 29*  CREATININE 6.35* 7.30* 5.16*  CALCIUM 9.2 9.1 8.7*  PHOS  --  3.7  --      Liver Function Tests: Recent Labs  Lab 09/14/22 1910 09/15/22 1123  AST 31  --   ALT 17  --   ALKPHOS 102  --   BILITOT 1.0  --   PROT 7.3  --   ALBUMIN 3.7 2.9*    No results for input(s): "LIPASE", "AMYLASE" in the last 168  hours. No results for input(s): "AMMONIA" in the last 168 hours.  CBC: Recent Labs  Lab 09/14/22 1910 09/15/22 0752 09/17/22 0448  WBC 12.1* 12.9* 5.1  NEUTROABS 10.9*  --   --   HGB 13.2 13.3 11.6*  HCT 40.0 40.2 35.2*  MCV 101.8* 101.8* 101.7*  PLT 183 186 112*     Cardiac Enzymes: No results for input(s): "CKTOTAL", "CKMB", "CKMBINDEX", "TROPONINI" in the last 168 hours.  BNP: Invalid input(s): "POCBNP"  CBG: No results for input(s): "GLUCAP" in the last 168 hours.  Microbiology: Results for orders placed or performed during the hospital encounter of 09/14/22  Resp panel by RT-PCR (RSV, Flu A&B, Covid) Anterior Nasal Swab     Status: None   Collection Time: 09/14/22  7:10 PM   Specimen: Anterior Nasal Swab  Result Value Ref Range Status   SARS Coronavirus 2 by RT PCR NEGATIVE NEGATIVE Final    Comment: (NOTE) SARS-CoV-2 target nucleic acids are NOT DETECTED.  The SARS-CoV-2 RNA is generally detectable in upper respiratory specimens during the acute phase of infection. The lowest concentration of SARS-CoV-2 viral copies this assay can detect is 138 copies/mL. A negative result does not preclude SARS-Cov-2 infection and should not be used as the sole basis for treatment or other patient management decisions. A negative  result may occur with  improper specimen collection/handling, submission of specimen other than nasopharyngeal swab, presence of viral mutation(s) within the areas targeted by this assay, and inadequate number of viral copies(<138 copies/mL). A negative result must be combined with clinical observations, patient history, and epidemiological information. The expected result is Negative.  Fact Sheet for Patients:  BloggerCourse.com  Fact Sheet for Healthcare Providers:  SeriousBroker.it  This test is no t yet approved or cleared by the [place] FDA and  has been authorized for detection and/or  diagnosis of SARS-CoV-2 by FDA under an Emergency Use Authorization (EUA). This EUA will remain  in effect (meaning this test can be used) for the duration of the COVID-19 declaration under Section 564(b)(1) of the Act, 21 U.S.C.section 360bbb-3(b)(1), unless the authorization is terminated  or revoked sooner.       Influenza A by PCR NEGATIVE NEGATIVE Final   Influenza B by PCR NEGATIVE NEGATIVE Final    Comment: (NOTE) The Xpert Xpress SARS-CoV-2/FLU/RSV plus assay is intended as an aid in the diagnosis of influenza from Nasopharyngeal swab specimens and should not be used as a sole basis for treatment. Nasal washings and aspirates are unacceptable for Xpert Xpress SARS-CoV-2/FLU/RSV testing.  Fact Sheet for Patients: BloggerCourse.com  Fact Sheet for Healthcare Providers: SeriousBroker.it  This test is not yet approved or cleared by the [place] FDA and has been authorized for detection and/or diagnosis of SARS-CoV-2 by FDA under an Emergency Use Authorization (EUA). This EUA will remain in effect (meaning this test can be used) for the duration of the COVID-19 declaration under Section 564(b)(1) of the Act, 21 U.S.C. section 360bbb-3(b)(1), unless the authorization is terminated or revoked.     Resp Syncytial Virus by PCR NEGATIVE NEGATIVE Final    Comment: (NOTE) Fact Sheet for Patients: BloggerCourse.com  Fact Sheet for Healthcare Providers: SeriousBroker.it  This test is not yet approved or cleared by the [place] FDA and has been authorized for detection and/or diagnosis of SARS-CoV-2 by FDA under an Emergency Use Authorization (EUA). This EUA will remain in effect (meaning this test can be used) for the duration of the COVID-19 declaration under Section 564(b)(1) of the Act, 21 U.S.C. section 360bbb-3(b)(1), unless the authorization is terminated  or revoked.  Performed at [facility], [address] [place], [place] [address]   Blood Culture (routine x 2)     Status: Abnormal   Collection Time: 09/14/22  7:10 PM   Specimen: BLOOD  Result Value Ref Range Status   Specimen Description   Final    BLOOD BLOOD RIGHT WRIST Performed at [facility], [address] [place], [place] [address]    Special Requests   Final    BOTTLES DRAWN AEROBIC AND ANAEROBIC Blood Culture results may not be optimal due to an inadequate volume of blood received in culture bottles Performed at [facility], [address] [place], [place] [address]    Culture  Setup Time   Final    GRAM POSITIVE COCCI IN BOTH AEROBIC AND ANAEROBIC BOTTLES CRITICAL RESULT CALLED TO, READ BACK BY AND VERIFIED WITH: Albertine Grates 09/15/22 0715 MW Performed at [facility], [address], [place], [place] [address]    Culture STREPTOCOCCUS GALLOLYTICUS (A)  Final   Report Status 09/17/2022 FINAL  Final   Organism ID, Bacteria STREPTOCOCCUS GALLOLYTICUS  Final      Susceptibility   Streptococcus gallolyticus - MIC*    PENICILLIN 0.12 SENSITIVE Sensitive  CEFTRIAXONE <=0.12 SENSITIVE Sensitive     ERYTHROMYCIN >=8 RESISTANT Resistant     LEVOFLOXACIN 2 SENSITIVE Sensitive     VANCOMYCIN 0.5 SENSITIVE Sensitive     * STREPTOCOCCUS GALLOLYTICUS  Blood Culture ID Panel (Reflexed)     Status: Abnormal   Collection Time: 09/14/22  7:10 PM  Result Value Ref Range Status   Enterococcus faecalis NOT DETECTED NOT DETECTED Final   Enterococcus Faecium NOT DETECTED NOT DETECTED Final   Listeria monocytogenes NOT DETECTED NOT DETECTED Final   Staphylococcus species NOT DETECTED NOT DETECTED Final   Staphylococcus aureus (BCID) NOT DETECTED NOT DETECTED Final   Staphylococcus epidermidis NOT DETECTED NOT DETECTED Final   Staphylococcus lugdunensis NOT DETECTED NOT DETECTED Final   Streptococcus species DETECTED (A) NOT DETECTED  Final    Comment: Not Enterococcus species, Streptococcus agalactiae, Streptococcus pyogenes, or Streptococcus pneumoniae. CRITICAL RESULT CALLED TO, READ BACK BY AND VERIFIED WITH: TRAY GREENWOOD 09/15/22 0715 MW    Streptococcus agalactiae NOT DETECTED NOT DETECTED Final   Streptococcus pneumoniae NOT DETECTED NOT DETECTED Final   Streptococcus pyogenes NOT DETECTED NOT DETECTED Final   A.calcoaceticus-baumannii NOT DETECTED NOT DETECTED Final   Bacteroides fragilis NOT DETECTED NOT DETECTED Final   Enterobacterales NOT DETECTED NOT DETECTED Final   Enterobacter cloacae complex NOT DETECTED NOT DETECTED Final   Escherichia coli NOT DETECTED NOT DETECTED Final   Klebsiella aerogenes NOT DETECTED NOT DETECTED Final   Klebsiella oxytoca NOT DETECTED NOT DETECTED Final   Klebsiella pneumoniae NOT DETECTED NOT DETECTED Final   Proteus species NOT DETECTED NOT DETECTED Final   Salmonella species NOT DETECTED NOT DETECTED Final   Serratia marcescens NOT DETECTED NOT DETECTED Final   Haemophilus influenzae NOT DETECTED NOT DETECTED Final   Neisseria meningitidis NOT DETECTED NOT DETECTED Final   Pseudomonas aeruginosa NOT DETECTED NOT DETECTED Final   Stenotrophomonas maltophilia NOT DETECTED NOT DETECTED Final   Candida albicans NOT DETECTED NOT DETECTED Final   Candida auris NOT DETECTED NOT DETECTED Final   Candida glabrata NOT DETECTED NOT DETECTED Final   Candida krusei NOT DETECTED NOT DETECTED Final   Candida parapsilosis NOT DETECTED NOT DETECTED Final   Candida tropicalis NOT DETECTED NOT DETECTED Final   Cryptococcus neoformans/gattii NOT DETECTED NOT DETECTED Final    Comment: Performed at [facility], [address] [place], [place] [address]  Blood Culture (routine x 2)     Status: Abnormal   Collection Time: 09/14/22  7:15 PM   Specimen: BLOOD  Result Value Ref Range Status   Specimen Description   Final    BLOOD RIGHT ANTECUBITAL Performed at [facility], [address] [place], [place] [address]    Special Requests   Final    BOTTLES DRAWN AEROBIC AND ANAEROBIC Blood Culture results may not be optimal due to an inadequate volume of blood received in culture bottles Performed at [facility], [address] [place], [place] [address]    Culture  Setup Time   Final    GRAM POSITIVE COCCI IN BOTH AEROBIC AND ANAEROBIC BOTTLES CRITICAL RESULT CALLED TO, READ BACK BY AND VERIFIED WITH: TRAY GREENWOOD 09/15/22 0715 MW CRITICAL VALUE NOTED.  VALUE IS CONSISTENT WITH PREVIOUSLY REPORTED AND CALLED VALUE.    Culture (A)  Final    STREPTOCOCCUS GALLOLYTICUS SUSCEPTIBILITIES PERFORMED ON PREVIOUS CULTURE WITHIN THE LAST 5 DAYS. Performed at [facility], [address], [place], [place] [address]    Report Status 09/17/2022 FINAL  Final    Coagulation Studies: Recent Labs    09/14/22 1910  LABPROT 13.9  INR 1.1     Urinalysis: No results for input(s): "COLORURINE", "LABSPEC", "PHURINE", "GLUCOSEU", "HGBUR", "BILIRUBINUR", "KETONESUR", "PROTEINUR", "UROBILINOGEN", "NITRITE", "LEUKOCYTESUR" in the last 72 hours.  Invalid input(s): "APPERANCEUR"    Imaging: No results found.   Medications:    cefTRIAXone (ROCEPHIN)  IV 2 g (09/17/22 1022)    amLODipine  10 mg Oral q morning   calcium acetate  1,334 mg Oral TID WC   carbamazepine  1,000 mg Oral q morning   Chlorhexidine Gluconate Cloth  6 each Topical Q0600   cholecalciferol  1,000 Units Oral q AM   cloNIDine  0.2 mg Transdermal Weekly   ferrous sulfate  325 mg Oral Daily   fluPHENAZine  2.5 mg Oral Daily   fluticasone furoate-vilanterol  1 puff Inhalation Daily   heparin  5,000 Units Subcutaneous Q8H   isosorbide mononitrate  60 mg Oral BID   levothyroxine  50 mcg Oral Q0600   multivitamin  1 tablet Oral QHS   pantoprazole  40 mg Oral Daily   trihexyphenidyl  2 mg Oral BID   zinc sulfate  220 mg Oral Daily   acetaminophen **OR** acetaminophen,  albuterol, HYDROcodone-acetaminophen, morphine injection, ondansetron **OR** ondansetron (ZOFRAN) IV, polyethylene glycol  Assessment/ Plan:  [name] is a 62 y.o.  male is a 62 year old male with past medical conditions including COPD, hypertension, DVT, schizophrenia, and end-stage renal disease on hemodialysis.  Patient presents to the emergency department with lethargy  [facility]/MWF/left aVF   Fluid volume overload with end stage renal disease on hemodialysis. CT chest abd pelvis show fluid overload with mild pulmonary edema and pleural effusions.  Patient received dialysis yesterday with UF 2 L achieved.  Next treatment scheduled for Friday.  2. Anemia of chronic kidney disease Lab Results  Component Value Date   HGB 11.6 (L) 09/17/2022    Hemoglobin at goal.  3. Secondary Hyperparathyroidism: with outpatient labs: PTH 459, phosphorus 3.4, calcium 9.7 on 06/08/22 .   Lab Results  Component Value Date   PTH 194 (H) 05/11/2017   CALCIUM 8.7 (L) 09/17/2022   CAION 1.19 03/27/2022   PHOS 3.7 09/15/2022   Patient receives cholecalciferol and calcium acetate outpatient.  Bone minerals acceptable.  4.  Hypertension with chronic kidney disease.  Home regimen includes amlodipine, clonidine, hydralazine, isosorbide, doxazosin and minoxidil.  All currently held at this time.  Blood pressure acceptable for this patient.   LOS: 3   4/18/202412:34 PM

## 2022-09-17 NOTE — Progress Notes (Signed)
PROGRESS NOTE    Todd Brown   ZOX:[id number] DOB: [birth year]  DOA: 09/14/2022 Date of Service: 09/17/22 PCP: Smiley Houseman, NP     Brief Narrative / Hospital Course:  Todd Brown is a 62 y.o. 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  Patient was in his usual state of health earlier and went to and completed his dialysis session but on arrival home he was lethargic.  On arrival of EMS he was saturating in the 70s on room air and was placed on NRB for transport. Of note: bacteremia secondary to strep gallolyticus in 01/24. Suspected source was from the PermCath (PermCath was removed during that hospitalization). Standard ECHO was obtained which did not show any vegetation.  ID was consulted and they requested TEE and colonscopy, however, the pt did not agree to have these procedures. Pt was continued on IV cefazolin 2g MWF with dialysis for strep gallolyticus bacteremia (END DATE Jul 20 2022).   04/15: ED course and data review: Tmax 104.2 with pulse 107 respirations 23 and O2 sat initially 89% on NRB improving to 94% on HFNC at 6 L.  BP was 154/86 on arrival.  VBG on HFNC showed pH 7.5 with pCO2 40.  Labs otherwise significant for WBC 12,100, lactic acid 0.9 and procalcitonin 0.72.  Respiratory viral panel negative for COVID flu and RSV.  Troponin 81. Patient started on cefepime vancomycin and Flagyl for sepsis of unknown source. Hospitalist consulted for admission.  04/16: Blood culture revealed strep species. Discontinued cefepime, vancomycin and Flagyl and place patient on Rocephin 2 g IV daily. Consulted ID - recs for TEE and  colonoscopy, AV graft evauation. Patient did not receive IV fluid resuscitation due to fluid overload. Removed 2L in HD 04/17: dialysis today - examined afterward. Pt has no complaints other than wants something to eat. Removed another 2L in HD.  04/18: cardiology consulted for TEE - await improvement in volume status, TEE will have to be w/ [facility] not [facility] d/t staffing. Repeating BCx. Will d/w guardian re: consent for TEE   Consultants:  Nephrology  Infectious Disease  Cardiology   Procedures: None       ASSESSMENT & PLAN:   Principal Problem:   Acute on chronic respiratory failure with hypoxia Active Problems:   Severe sepsis versus SIRS   CAP (community acquired pneumonia)   Anasarca (mild pulmonary edema, pleural and pericardial effusion, ascites on CT)   Acute metabolic encephalopathy   ESRD on dialysis   Essential hypertension   COPD (chronic obstructive pulmonary disease)   Schizophrenia   Essential hypertension, benign   Bacteremia   Acute on chronic respiratory failure  Severe sepsis  Community-acquired pneumonia  Streptococcus bacteremia.   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  Organ dysfunction of respiratory failure and  altered mental status. CT chest abdomen and pelvis consistent with anasarca.  Cannot rule out superimposed pulmonary infection Blood culture revealed strep species Continue Rocephin 2 g IV daily Consult ID --> will need TEE and colonoscopy, evaluate graft  Patient did not receive IV fluid resuscitation due to fluid overload, conitnue w/ HD  Goals of care Patient does not demonstrate adequate understanding of his medical illness(es), he can not make an informed decision about complex treatments - consent or refusal.  Given frailty and multiple comorbidities, he is less likely to survive resuscitation measures in the  event of a cardiopulmonary arrest but I am not convinced at this time that resuscitation would be utterly futile, either. He has no terminal illness at this time which would recommend strongly for DNR/DNI status.  Will discuss with DSS guardian but Full Code for now guardian will have to give consent/refusal for TEE or other procedures  Consider palliative care consult pending clinical course   Acute on chronic respiratory failure with hypoxia - improving Etiology acute on chronic diastolic CHF and CAP  Continue high flow nasal cannula to maintain sats over 92% and wean as tolerated Expect improvement in oxygenation following resolution of acute illness  Acute metabolic encephalopathy Secondary to sepsis from Streptococcus bacteremia Patient is oriented to person and place but not to time Monitor patient closely    Acute on chronic diastolic dysfunction CHF Anasarca  EF 06/2022 showing EF 60 to 65% with grade 1 diastolic dysfunction CT with findings of volume overload, mild pulmonary edema with pleural effusions, small volume pericardial effusion and anasarca and small volume fluid ascites Nephrology consult for management of fluid status   ESRD on dialysis Nephrology consult for renal replacement therapy   Essential hypertension Restart home meds    COPD (chronic obstructive pulmonary disease) Continue home inhalers DuoNebs as needed   Schizophrenia Continue Prolixin and carbamazepine  Vision impairment Precautions for fall   DVT prophylaxis: heparin Pertinent IV fluids/nutrition: no continuous IV fluids, cardiac diet  Central lines / invasive devices: none  Code Status: FULL CODE ACP documentation reviewed: none on file   Current Admission Status: inpatient  TOC needs / Dispo plan: HH PT Barriers to discharge / significant pending items: bacteremia, pend repeat cultures, ID recs, fluid overload, TEE, IV abx, will be here several more days pending results                Subjective / Brief ROS:  Patient reports no concerns  Denies CP/SOB.  Pain controlled.    Family Communication: called legal guardian to give update - [name] at DSS, left HIPAA compliant vm 04/17 and spoke to her later in the day to give update. Called 04/18 1:22 PM and voicemail left again.     Objective Findings:  Vitals:   09/16/22 2147 09/17/22 0338 09/17/22 0600 09/17/22 0733  BP: 121/78 (!) 144/92  (!) 163/89  Pulse: (!) 57 (!) 58  61  Resp: 16 18  18   Temp: 98.7 F (37.1 C) 97.9 F (36.6 C)  99 F (37.2 C)  TempSrc:    Oral  SpO2: 100% 100% 98% 99%  Weight:      Height:        Intake/Output Summary (Last 24 hours) at 09/17/2022 1323 Last data filed at 09/17/2022 1032 Gross per 24 hour  Intake 340 ml  Output --  Net 340 ml   Filed Weights   09/14/22 1907  Weight: 79.4 kg    Examination:  Physical Exam Constitutional:      General: He is not in acute distress. Cardiovascular:     Rate and Rhythm: Normal rate and regular rhythm.  Pulmonary:     Effort: Pulmonary effort is normal.     Breath sounds: Normal breath sounds.  Abdominal:     General: Abdomen  is flat.     Palpations: Abdomen is soft.  Musculoskeletal:     Right lower leg: Edema (+1) present.     Left lower leg: Edema (+1) present.  Skin:    General: Skin is warm and dry.  Neurological:     General: No focal deficit present.     Mental Status: He is alert. Mental status is at baseline.  Psychiatric:        Behavior: Behavior normal.          Scheduled Medications:   amLODipine  10 mg Oral q morning   calcium acetate  1,334 mg Oral TID WC   carbamazepine  1,000 mg Oral q morning   Chlorhexidine Gluconate Cloth  6 each Topical Q0600   cholecalciferol  1,000 Units Oral q AM   cloNIDine  0.2 mg Transdermal Weekly   ferrous sulfate  325 mg Oral Daily   fluPHENAZine  2.5 mg Oral Daily   fluticasone furoate-vilanterol  1 puff Inhalation Daily   heparin   5,000 Units Subcutaneous Q8H   isosorbide mononitrate  60 mg Oral BID   levothyroxine  50 mcg Oral Q0600   multivitamin  1 tablet Oral QHS   pantoprazole  40 mg Oral Daily   trihexyphenidyl  2 mg Oral BID   zinc sulfate  220 mg Oral Daily    Continuous Infusions:  cefTRIAXone (ROCEPHIN)  IV 2 g (09/17/22 1022)    PRN Medications:  acetaminophen **OR** acetaminophen, albuterol, HYDROcodone-acetaminophen, morphine injection, ondansetron **OR** ondansetron (ZOFRAN) IV, polyethylene glycol  Antimicrobials from admission:  Anti-infectives (From admission, onward)    Start     Dose/Rate Route Frequency Ordered Stop   09/16/22 2200  vancomycin (VANCOREADY) IVPB 750 mg/150 mL  Status:  Discontinued        750 mg 150 mL/hr over 60 Minutes Intravenous Every M-W-F (Hemodialysis) 09/15/22 0031 09/15/22 1329   09/15/22 1000  ceFEPIme (MAXIPIME) 2 g in sodium chloride 0.9 % 100 mL IVPB  Status:  Discontinued        2 g 200 mL/hr over 30 Minutes Intravenous Every 12 hours 09/15/22 0031 09/15/22 0734   09/15/22 0800  metroNIDAZOLE (FLAGYL) IVPB 500 mg  Status:  Discontinued        500 mg 100 mL/hr over 60 Minutes Intravenous Every 12 hours 09/14/22 2248 09/15/22 1329   09/15/22 0800  cefTRIAXone (ROCEPHIN) 2 g in sodium chloride 0.9 % 100 mL IVPB        2 g 200 mL/hr over 30 Minutes Intravenous Every 24 hours 09/15/22 0734     09/15/22 0000  vancomycin (VANCOREADY) IVPB 750 mg/150 mL        750 mg 150 mL/hr over 60 Minutes Intravenous  Once 09/14/22 2258 09/15/22 0200   09/14/22 1915  ceFEPIme (MAXIPIME) 2 g in sodium chloride 0.9 % 100 mL IVPB        2 g 200 mL/hr over 30 Minutes Intravenous  Once 09/14/22 1909 09/14/22 1949   09/14/22 1915  metroNIDAZOLE (FLAGYL) IVPB 500 mg        500 mg 100 mL/hr over 60 Minutes Intravenous  Once 09/14/22 1909 09/14/22 2052   09/14/22 1915  vancomycin (VANCOCIN) IVPB 1000 mg/200 mL premix        1,000 mg 200 mL/hr over 60 Minutes Intravenous  Once  09/14/22 1909 09/14/22 2210           Data Reviewed:  I have personally reviewed the following...  CBC: Recent Labs  Lab 09/14/22 1910 09/15/22 0752 09/17/22 0448  WBC 12.1* 12.9* 5.1  NEUTROABS 10.9*  --   --   HGB 13.2 13.3 11.6*  HCT 40.0 40.2 35.2*  MCV 101.8* 101.8* 101.7*  PLT 183 186 112*   Basic Metabolic Panel: Recent Labs  Lab 09/14/22 1910 09/15/22 1123 09/17/22 0448  NA 138 133* 136  K 4.5 4.7 3.9  CL 95* 94* 96*  CO2 29 27 29   GLUCOSE 80 98 89  BUN 34* 44* 29*  CREATININE 6.35* 7.30* 5.16*  CALCIUM 9.2 9.1 8.7*  PHOS  --  3.7  --    GFR: Estimated Creatinine Clearance: 16 mL/min (A) (by C-G formula based on SCr of 5.16 mg/dL (H)). Liver Function Tests: Recent Labs  Lab 09/14/22 1910 09/15/22 1123  AST 31  --   ALT 17  --   ALKPHOS 102  --   BILITOT 1.0  --   PROT 7.3  --   ALBUMIN 3.7 2.9*   No results for input(s): "LIPASE", "AMYLASE" in the last 168 hours. No results for input(s): "AMMONIA" in the last 168 hours. Coagulation Profile: Recent Labs  Lab 09/14/22 1910  INR 1.1   Cardiac Enzymes: No results for input(s): "CKTOTAL", "CKMB", "CKMBINDEX", "TROPONINI" in the last 168 hours. BNP (last 3 results) No results for input(s): "PROBNP" in the last 8760 hours. HbA1C: No results for input(s): "HGBA1C" in the last 72 hours. CBG: No results for input(s): "GLUCAP" in the last 168 hours. Lipid Profile: No results for input(s): "CHOL", "HDL", "LDLCALC", "TRIG", "CHOLHDL", "LDLDIRECT" in the last 72 hours. Thyroid Function Tests: No results for input(s): "TSH", "T4TOTAL", "FREET4", "T3FREE", "THYROIDAB" in the last 72 hours. Anemia Panel: No results for input(s): "VITAMINB12", "FOLATE", "FERRITIN", "TIBC", "IRON", "RETICCTPCT" in the last 72 hours. Most Recent Urinalysis On File:     Component Value Date/Time   COLORURINE YELLOW (A) 05/17/2015 0012   APPEARANCEUR HAZY (A) 05/17/2015 0012   LABSPEC 1.009 05/17/2015 0012    PHURINE 7.0 05/17/2015 0012   GLUCOSEU NEGATIVE 05/17/2015 0012   HGBUR 1+ (A) 05/17/2015 0012   BILIRUBINUR NEGATIVE 05/17/2015 0012   KETONESUR NEGATIVE 05/17/2015 0012   PROTEINUR 30 (A) 05/17/2015 0012   NITRITE NEGATIVE 05/17/2015 0012   LEUKOCYTESUR 3+ (A) 05/17/2015 0012   Sepsis Labs: @LABRCNTIP (procalcitonin:4,lacticidven:4) Microbiology: Recent Results (from the past 240 hour(s))  Resp panel by RT-PCR (RSV, Flu A&B, Covid) Anterior Nasal Swab     Status: None   Collection Time: 09/14/22  7:10 PM   Specimen: Anterior Nasal Swab  Result Value Ref Range Status   SARS Coronavirus 2 by RT PCR NEGATIVE NEGATIVE Final    Comment: (NOTE) SARS-CoV-2 target nucleic acids are NOT DETECTED.  The SARS-CoV-2 RNA is generally detectable in upper respiratory specimens during the acute phase of infection. The lowest concentration of SARS-CoV-2 viral copies this assay can detect is 138 copies/mL. A negative result does not preclude SARS-Cov-2 infection and should not be used as the sole basis for treatment or other patient management decisions. A negative result may occur with  improper specimen collection/handling, submission of specimen other than nasopharyngeal swab, presence of viral mutation(s) within the areas targeted by this assay, and inadequate number of viral copies(<138 copies/mL). A negative result must be combined with clinical observations, patient history, and epidemiological information. The expected result is Negative.  Fact Sheet for Patients:  BloggerCourse.com  Fact Sheet for Healthcare Providers:  SeriousBroker.it  This test is no t yet approved or cleared by  the [employer] and  has been authorized for detection and/or diagnosis of SARS-CoV-2 by FDA under an Emergency Use Authorization (EUA). This EUA will remain  in effect (meaning this test can be used) for the duration of the COVID-19 declaration  under Section 564(b)(1) of the Act, 21 U.S.C.section 360bbb-3(b)(1), unless the authorization is terminated  or revoked sooner.       Influenza A by PCR NEGATIVE NEGATIVE Final   Influenza B by PCR NEGATIVE NEGATIVE Final    Comment: (NOTE) The Xpert Xpress SARS-CoV-2/FLU/RSV plus assay is intended as an aid in the diagnosis of influenza from Nasopharyngeal swab specimens and should not be used as a sole basis for treatment. Nasal washings and aspirates are unacceptable for Xpert Xpress SARS-CoV-2/FLU/RSV testing.  Fact Sheet for Patients: BloggerCourse.com  Fact Sheet for Healthcare Providers: SeriousBroker.it  This test is not yet approved or cleared by the [place] FDA and has been authorized for detection and/or diagnosis of SARS-CoV-2 by FDA under an Emergency Use Authorization (EUA). This EUA will remain in effect (meaning this test can be used) for the duration of the COVID-19 declaration under Section 564(b)(1) of the Act, 21 U.S.C. section 360bbb-3(b)(1), unless the authorization is terminated or revoked.     Resp Syncytial Virus by PCR NEGATIVE NEGATIVE Final    Comment: (NOTE) Fact Sheet for Patients: BloggerCourse.com  Fact Sheet for Healthcare Providers: SeriousBroker.it  This test is not yet approved or cleared by the [place] FDA and has been authorized for detection and/or diagnosis of SARS-CoV-2 by FDA under an Emergency Use Authorization (EUA). This EUA will remain in effect (meaning this test can be used) for the duration of the COVID-19 declaration under Section 564(b)(1) of the Act, 21 U.S.C. section 360bbb-3(b)(1), unless the authorization is terminated or revoked.  Performed at [facility] [place] [facility], [address] [place], [place] [address]   Blood Culture (routine x 2)     Status: Abnormal   Collection Time: 09/14/22  7:10 PM    Specimen: BLOOD  Result Value Ref Range Status   Specimen Description   Final    BLOOD BLOOD RIGHT WRIST Performed at [facility], [address] [place], [place] [address]    Special Requests   Final    BOTTLES DRAWN AEROBIC AND ANAEROBIC Blood Culture results may not be optimal due to an inadequate volume of blood received in culture bottles Performed at [facility], [address], [place], [place] [address]    Culture  Setup Time   Final    GRAM POSITIVE COCCI IN BOTH AEROBIC AND ANAEROBIC BOTTLES CRITICAL RESULT CALLED TO, READ BACK BY AND VERIFIED WITH: Albertine Grates 09/15/22 0715 MW Performed at [facility], [address] [place], [place] [address]    Culture STREPTOCOCCUS GALLOLYTICUS (A)  Final   Report Status 09/17/2022 FINAL  Final   Organism ID, Bacteria STREPTOCOCCUS GALLOLYTICUS  Final      Susceptibility   Streptococcus gallolyticus - MIC*    PENICILLIN 0.12 SENSITIVE Sensitive     CEFTRIAXONE <=0.12 SENSITIVE Sensitive     ERYTHROMYCIN >=8 RESISTANT Resistant     LEVOFLOXACIN 2 SENSITIVE Sensitive     VANCOMYCIN 0.5 SENSITIVE Sensitive     * STREPTOCOCCUS GALLOLYTICUS  Blood Culture ID Panel (Reflexed)     Status: Abnormal   Collection Time: 09/14/22  7:10 PM  Result Value Ref Range Status   Enterococcus faecalis NOT DETECTED NOT DETECTED Final   Enterococcus Faecium NOT  DETECTED NOT DETECTED Final   Listeria monocytogenes NOT DETECTED NOT DETECTED Final   Staphylococcus species NOT DETECTED NOT DETECTED Final   Staphylococcus aureus (BCID) NOT DETECTED NOT DETECTED Final   Staphylococcus epidermidis NOT DETECTED NOT DETECTED Final   Staphylococcus lugdunensis NOT DETECTED NOT DETECTED Final   Streptococcus species DETECTED (A) NOT DETECTED Final    Comment: Not Enterococcus species, Streptococcus agalactiae, Streptococcus pyogenes, or Streptococcus pneumoniae. CRITICAL RESULT CALLED TO, READ BACK BY AND VERIFIED WITH: TRAY  GREENWOOD 09/15/22 0715 MW    Streptococcus agalactiae NOT DETECTED NOT DETECTED Final   Streptococcus pneumoniae NOT DETECTED NOT DETECTED Final   Streptococcus pyogenes NOT DETECTED NOT DETECTED Final   A.calcoaceticus-baumannii NOT DETECTED NOT DETECTED Final   Bacteroides fragilis NOT DETECTED NOT DETECTED Final   Enterobacterales NOT DETECTED NOT DETECTED Final   Enterobacter cloacae complex NOT DETECTED NOT DETECTED Final   Escherichia coli NOT DETECTED NOT DETECTED Final   Klebsiella aerogenes NOT DETECTED NOT DETECTED Final   Klebsiella oxytoca NOT DETECTED NOT DETECTED Final   Klebsiella pneumoniae NOT DETECTED NOT DETECTED Final   Proteus species NOT DETECTED NOT DETECTED Final   Salmonella species NOT DETECTED NOT DETECTED Final   Serratia marcescens NOT DETECTED NOT DETECTED Final   Haemophilus influenzae NOT DETECTED NOT DETECTED Final   Neisseria meningitidis NOT DETECTED NOT DETECTED Final   Pseudomonas aeruginosa NOT DETECTED NOT DETECTED Final   Stenotrophomonas maltophilia NOT DETECTED NOT DETECTED Final   Candida albicans NOT DETECTED NOT DETECTED Final   Candida auris NOT DETECTED NOT DETECTED Final   Candida glabrata NOT DETECTED NOT DETECTED Final   Candida krusei NOT DETECTED NOT DETECTED Final   Candida parapsilosis NOT DETECTED NOT DETECTED Final   Candida tropicalis NOT DETECTED NOT DETECTED Final   Cryptococcus neoformans/gattii NOT DETECTED NOT DETECTED Final    Comment: Performed at [facility], [address] [place], [place] [address]  Blood Culture (routine x 2)     Status: Abnormal   Collection Time: 09/14/22  7:15 PM   Specimen: BLOOD  Result Value Ref Range Status   Specimen Description   Final    BLOOD RIGHT ANTECUBITAL Performed at [facility], [address] [place], [place] [address]    Special Requests   Final    BOTTLES DRAWN AEROBIC AND ANAEROBIC Blood Culture results may not be optimal due to an inadequate volume  of blood received in culture bottles Performed at [facility], [address] [place], [place] [address]    Culture  Setup Time   Final    GRAM POSITIVE COCCI IN BOTH AEROBIC AND ANAEROBIC BOTTLES CRITICAL RESULT CALLED TO, READ BACK BY AND VERIFIED WITH: TRAY GREENWOOD 09/15/22 0715 MW CRITICAL VALUE NOTED.  VALUE IS CONSISTENT WITH PREVIOUSLY REPORTED AND CALLED VALUE.    Culture (A)  Final    STREPTOCOCCUS GALLOLYTICUS SUSCEPTIBILITIES PERFORMED ON PREVIOUS CULTURE WITHIN THE LAST 5 DAYS. Performed at [facility], [address], [place], [place] [address]    Report Status 09/17/2022 FINAL  Final      Radiology Studies last 3 days: CT CHEST ABDOMEN PELVIS W CONTRAST  Result Date: 09/14/2022 CLINICAL DATA:  Sepsis. Pt with c/o lethargy after dialysis today. Pt received full tx at dialysis. Hx of COPD, wears 02 at 2L via [place]. EMS note pt to be 76% upon arrival to scene. EXAM: CT CHEST, ABDOMEN, AND PELVIS WITH CONTRAST TECHNIQUE: Multidetector CT imaging of the chest, abdomen and pelvis  was performed following the standard protocol during bolus administration of intravenous contrast. RADIATION DOSE REDUCTION: This exam was performed according to the departmental dose-optimization program which includes automated exposure control, adjustment of the mA and/or kV according to patient size and/or use of iterative reconstruction technique. CONTRAST:  OMNIPAQUE IOHEXOL 300 MG/ML  SOLN COMPARISON:  CT chest 06/19/2022 FINDINGS: CT CHEST FINDINGS Cardiovascular: Enlarged heart size. Small volume pericardial effusion. The thoracic aorta is normal in caliber. Mild atherosclerotic plaque of the thoracic aorta. Four vessel coronary artery calcifications. The main pulmonary artery is borderline enlarged measuring up to 3.1 cm. No central pulmonary embolus. Mediastinum/Nodes: No enlarged mediastinal, hilar, or axillary lymph nodes. Thyroid gland, trachea, and esophagus demonstrate no  significant findings. Lungs/Pleura: Interlobular septal wall thickening. Bibasilar passive atelectasis. Lingular ground-glass airspace opacity likely atelectasis. No focal consolidation. No pulmonary nodule. No pulmonary mass. Bilateral trace, left greater than right, pleural effusions. No pneumothorax. Musculoskeletal: No chest wall abnormality.  Bilateral gynecomastia. No suspicious lytic or blastic osseous lesions. No acute displaced fracture. Old healed sternal fracture. CT ABDOMEN PELVIS FINDINGS Hepatobiliary: No focal liver abnormality. No gallstones, gallbladder wall thickening, or pericholecystic fluid. No biliary dilatation. Pancreas: No focal lesion. Normal pancreatic contour. No surrounding inflammatory changes. No main pancreatic ductal dilatation. Spleen: Normal in size without focal abnormality. Adrenals/Urinary Tract: Bilateral adrenal gland hyperplasia with a poorly visualized 2 cm left adrenal gland nodule demonstrating a density of 7 Hounsfield units-consistent with a lipid rich adenoma, no further follow-up indicated. Bilateral renal cortical scarring. Bilateral kidneys enhance symmetrically. Fluid density lesions likely represent simple renal cysts. Simple renal cysts, in the absence of clinically indicated signs/symptoms, require no independent follow-up. No hydronephrosis. No hydroureter. The urinary bladder is unremarkable. No excretion of intravenous contrast on delayed imaging. Stomach/Bowel: Stomach is within normal limits. No evidence of bowel wall thickening or dilatation. Appendix appears normal. Vascular/Lymphatic: No abdominal aorta or iliac aneurysm. At least moderate atherosclerotic plaque of the aorta and its branches. No abdominal, pelvic, or inguinal lymphadenopathy. Reproductive: Prostate is unremarkable. Other: At least small volume simple free fluid within the abdomen pelvis. No intraperitoneal free gas. No organized fluid collection. Musculoskeletal: No abdominal wall hernia  or abnormality. No suspicious lytic or blastic osseous lesions. No acute displaced fracture. IMPRESSION: 1. Findings of volume overload. 2. Mild pulmonary edema with bilateral trace, left greater than right, pleural effusions. Associated bilateral lower lobe and lingular atelectasis. Superimposed infection not excluded. 3. Cardiomegaly. 4. Small volume pericardial effusion. 5. At least small volume simple free fluid ascites. 6. Anasarca. 7. Aortic Atherosclerosis (ICD10-I70.0) including four-vessel coronary calcification. 8. No excretion of intravenous contrast on delayed imaging. Correlate with creatinine levels. Electronically Signed   By: Tish Frederickson M.D.   On: 09/14/2022 21:25   DG Chest Port 1 View  Result Date: 09/14/2022 CLINICAL DATA:  Questionable sepsis - evaluate for abnormality Lethargy after dialysis today. EXAM: PORTABLE CHEST 1 VIEW COMPARISON:  Radiograph and CT 06/19/2022 FINDINGS: The previous right-sided dialysis catheter is been removed. Cardiomegaly is unchanged. Mediastinal contours are unchanged. Small bilateral pleural effusions which have increased from prior exam. Diffuse interstitial thickening may represent pulmonary edema superimposed on emphysema. No pneumothorax. IMPRESSION: Unchanged cardiomegaly. Small bilateral pleural effusions have increased from prior exam. Diffuse interstitial thickening may represent pulmonary edema superimposed on emphysema. Overall findings suggest fluid overload/CHF. Electronically Signed   By: Narda Rutherford M.D.   On: 09/14/2022 19:43             LOS: 3 days  Sunnie Nielsen, DO [facility] 09/17/2022, 1:23 PM    Dictation software may have been used to generate the above note. Typos may occur and escape review in typed/dictated notes. Please contact Dr Lyn Hollingshead directly for clarity if needed.  Staff may message me via secure chat in Epic  but this may not receive an immediate response,  please page me  for urgent matters!  If 7PM-7AM, please contact night coverage [email or web address]

## 2022-09-17 NOTE — TOC Initial Note (Addendum)
Transition of Care [facility]) - Initial/Assessment Note    Patient Details  Name: Todd Brown MRN: [id number] Date of Birth: 01/09/1961  [facility] CM/SW Contact:    Chapman Fitch, RN Phone Number: 09/17/2022, 10:28 AM  Clinical Narrative:                  Admitted for: CAP Admitted from:[facility] PCP: [facility] Current home health/prior home health/DME: [facility]  VM left for patients legal guardian Lenn Sink to update Spoke with [facility].  Patient walks short distances with a walker, and primarily uses a WC.  Patient on chronic 2L.   Therapy recomednign home health.  Doristine Mango is aware that due to patients insurance may not be able to find an agency to accepted  [facility] - tunable to accept [facility] - unable to accept [facility] - unable to accept [facility] - able to accept for PT [facility] unable to accept  Per [facility] patient will need EMS transport at discharge   Update Received return call from guardian Candice and she was updated        Patient Goals and CMS Choice            Expected Discharge Plan and Services                                              Prior Living Arrangements/Services                       Activities of Daily Living      Permission Sought/Granted                  Emotional Assessment              Admission diagnosis:  Bacteremia [R78.81] End stage renal disease [N18.6] ESRD on hemodialysis [N18.6, Z99.2] Acute on chronic respiratory failure [J96.20] Pneumonia of both lungs due to infectious organism, unspecified part of lung [J18.9] Sepsis with acute hypoxic respiratory failure without septic shock, due to unspecified organism [A41.9, R65.20, J96.01] Patient Active Problem List   Diagnosis Date Noted   CAP (community acquired pneumonia) 09/14/2022   Anasarca (mild pulmonary edema, pleural and pericardial effusion, ascites on CT) 09/14/2022    Acute on chronic respiratory failure 09/14/2022   Bacteremia 06/20/2022   Acute encephalopathy 06/19/2022   SIRS (systemic inflammatory response syndrome) 06/19/2022   NSTEMI (non-ST elevated myocardial infarction) 06/19/2022   Acute on chronic respiratory failure with hypoxia 05/21/2022   Hypoxia 05/20/2022   COVID-19 virus infection 05/20/2022   Hyponatremia 02/10/2022   Anemia due to chronic kidney disease 01/21/2022   Hyperkalemia 01/14/2022   Malnutrition of moderate degree 12/24/2021   Oral thrush 12/22/2021   Physical deconditioning 12/20/2021   Acute metabolic encephalopathy 12/15/2021   Essential hypertension 12/15/2021   ESRD on dialysis 12/15/2021   Altered mental status 10/08/2021   Hypertensive urgency 10/07/2021   COPD (chronic obstructive pulmonary disease) 02/26/2021   GERD (gastroesophageal reflux disease) 02/26/2021   Hypertension 11/28/2020   Acute respiratory failure with hypoxia 10/23/2019   Anemia of chronic disease 10/23/2019   Fluid overload 10/08/2018   Acute pulmonary edema 09/12/2018   Scrotal edema 05/10/2017   Symptomatic anemia 04/30/2017   Anticoagulated on Coumadin 09/14/2016   Cardiac arrest 08/03/2016   Severe sepsis versus SIRS 05/26/2016  Dialysis patient 04/15/2016   End stage renal disease 02/12/2016   Hyperkalemia 05/20/2015   Hepatitis C 09/26/2013   Schizophrenia 03/23/2013   Essential hypertension, benign 03/23/2013   Chronic kidney disease 03/23/2013   PCP:  Smiley Houseman, NP Pharmacy:  No Pharmacies Listed    Social Determinants of Health (SDOH) Social History: SDOH Screenings   Food Insecurity: No Food Insecurity (09/16/2022)  Housing: Low Risk  (09/16/2022)  Transportation Needs: No Transportation Needs (09/16/2022)  Utilities: Not At Risk (09/16/2022)  Alcohol Screen: Low Risk  (10/05/2019)  Financial Resource Strain: Low Risk  (10/08/2018)  Physical Activity: Unknown (10/08/2018)  Social Connections: Unknown (10/08/2018)   Stress: No Stress Concern Present (10/08/2018)  Tobacco Use: Low Risk  (09/16/2022)   SDOH Interventions:     Readmission Risk Interventions     No data to display

## 2022-09-17 NOTE — Progress Notes (Signed)
Physical Therapy Treatment Patient Details Name: Todd Brown MRN: [id number] DOB: 07-May-1961 Today's Date: 09/17/2022   History of Present Illness Pt is a 62 y.o. 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 MD assessment includes: severe sepsis, community-acquired pneumonia, streptococcus bacteremia, acute on chronic respiratory failure with hypoxia, acute metabolic encephalopathy, acute on chronic diastolic dysfunction CHF, and anasarca.    PT Comments    Pt was long sitting in bed, somewhat lethargic upon arrival. He agrees to session but did not present the same as he has over past few sessions. MD made aware. He required more assistance to exit bed and stand to RW. Unable to attempt ambulation away from EOB due pt's poor static standing balance and knee buckling. +2 assist required throughout session for safety. Author will return in the morning to see if pt is still presenting as he did this afternoon.     Recommendations for follow up therapy are one component of a multi-disciplinary discharge planning process, led by the attending physician.  Recommendations may be updated based on patient status, additional functional criteria and insurance authorization.     Assistance Recommended at Discharge Frequent or constant Supervision/Assistance  Patient can return home with the following A lot of help with walking and/or transfers;A lot of help with bathing/dressing/bathroom;Assistance with cooking/housework;Assistance with feeding;Direct supervision/assist for medications management;Direct supervision/assist for financial management;Assist for transportation;Help with stairs or ramp for entrance    Equipment Recommendations  None recommended by PT       Precautions / Restrictions Precautions Precautions: Fall Restrictions Weight Bearing Restrictions: No     Mobility  Bed Mobility Overal bed mobility: Needs Assistance Bed Mobility: Supine to Sit, Sit to Supine  Supine to sit: Mod assist, HOB elevated Sit to supine: Max assist General bed mobility comments: Pt required much more assistance this date to exit bed and stand.    Transfers Overall transfer level: Needs assistance Equipment used: Rolling walker (2 wheels) Transfers: Sit to/from Stand Sit to Stand: From elevated surface, Mod assist, +2 safety/equipment  General transfer comment: Pt required increased assistance to stand from elevated EOB surface. max vcs and +2 assistance for safety    Ambulation/Gait  Gait velocity: decreased  General Gait Details: Attempted to take steps away from EOB however pt has severe knee buckling and required +2 assist to prevent falling.   Balance Overall balance assessment: Needs assistance Sitting-balance support: Feet supported Sitting balance-[name]: Good     Standing balance support: Bilateral upper extremity supported, During functional activity, Reliant on assistive device for balance Standing balance-[name]: Poor Standing balance comment: pt has severe knee buckling with attempt to ambulate this date. High fall risk       Cognition Arousal/Alertness: Awake/alert, Lethargic Behavior During Therapy: Flat affect Overall Cognitive Status: No family/caregiver present to determine baseline cognitive functioning    General Comments: Pt is well known by author from previous admissions. seems to be close to his baseline cognitively.               Pertinent Vitals/Pain Pain Assessment Pain Assessment: No/denies pain     PT Goals (current goals can now be found in the care plan section) Acute Rehab PT Goals Patient Stated Goal: none stated Progress towards  PT  goals: Not progressing toward goals - comment (pt much more limited this date versus previous date.)    Frequency    Min 3X/week      PT Plan Discharge plan needs to be updated       AM-PAC PT "6 Clicks" Mobility   Outcome Measure  Help needed turning from your back to your side while in a flat bed without using bedrails?: A Lot Help needed moving from lying on your back to sitting on the side of a flat bed without using bedrails?: A Lot Help needed moving to and from a bed to a chair (including a wheelchair)?: A Lot Help needed standing up from a chair using your arms (e.g., wheelchair or bedside chair)?: A Lot Help needed to walk in hospital room?: Total Help needed climbing 3-5 steps with a railing? : Total 6 Click Score: 10    End of Session Equipment Utilized During Treatment: Gait belt;Oxygen Activity Tolerance: Patient tolerated treatment well Patient left: in chair;with call bell/phone within reach;with nursing/sitter in room Nurse Communication: Mobility status PT Visit Diagnosis: Difficulty in walking, not elsewhere classified (R26.2);Muscle weakness (generalized) (M62.81)     Time: 1510-1520 PT Time Calculation (min) (ACUTE ONLY): 10 min  Charges:  $Therapeutic Activity: 8-22 mins                     Jetta Lout PTA 09/17/22, 5:13 PM

## 2022-09-18 DIAGNOSIS — J9621 Acute and chronic respiratory failure with hypoxia: Secondary | ICD-10-CM | POA: Diagnosis not present

## 2022-09-18 LAB — CBC
HCT: 32.4 % — ABNORMAL LOW (ref 39.0–52.0)
Hemoglobin: 10.7 g/dL — ABNORMAL LOW (ref 13.0–17.0)
MCH: 33.3 pg (ref 26.0–34.0)
MCHC: 33 g/dL (ref 30.0–36.0)
MCV: 100.9 fL — ABNORMAL HIGH (ref 80.0–100.0)
Platelets: 154 10*3/uL (ref 150–400)
RBC: 3.21 MIL/uL — ABNORMAL LOW (ref 4.22–5.81)
RDW: 14.1 % (ref 11.5–15.5)
WBC: 3.7 10*3/uL — ABNORMAL LOW (ref 4.0–10.5)
nRBC: 0 % (ref 0.0–0.2)

## 2022-09-18 LAB — RENAL FUNCTION PANEL
Albumin: 2.7 g/dL — ABNORMAL LOW (ref 3.5–5.0)
Anion gap: 9 (ref 5–15)
BUN: 29 mg/dL — ABNORMAL HIGH (ref 8–23)
CO2: 29 mmol/L (ref 22–32)
Calcium: 8.4 mg/dL — ABNORMAL LOW (ref 8.9–10.3)
Chloride: 96 mmol/L — ABNORMAL LOW (ref 98–111)
Creatinine, Ser: 4.74 mg/dL — ABNORMAL HIGH (ref 0.61–1.24)
GFR, Estimated: 13 mL/min — ABNORMAL LOW (ref >60)
Glucose, Bld: 151 mg/dL — ABNORMAL HIGH (ref 70–99)
Phosphorus: 2.5 mg/dL (ref 2.5–4.6)
Potassium: 3.9 mmol/L (ref 3.5–5.1)
Sodium: 134 mmol/L — ABNORMAL LOW (ref 135–145)

## 2022-09-18 LAB — CULTURE, BLOOD (ROUTINE X 2)

## 2022-09-18 MED ORDER — HYDRALAZINE HCL 10 MG PO TABS
10.0000 mg | ORAL_TABLET | Freq: Three times a day (TID) | ORAL | Status: DC
  Administered 2022-09-18 – 2022-09-23 (×16): 10 mg via ORAL
  Filled 2022-09-18 (×17): qty 1

## 2022-09-18 NOTE — Progress Notes (Signed)
  Received patient in bed to unit.   Informed consent signed and in chart.    TX duration:3.5 hrs     Transported back to floor  Hand-off given to patient's nurse. No c/o no distress noted    Access used: LAV Access issues: none   Total UF removed: 2.0kg Medication(s) given: none Post HD VS: 147/81 Post HD weight: 74.6kg     Lynann Beaver  [facility]

## 2022-09-18 NOTE — Progress Notes (Signed)
PROGRESS NOTE    Todd Brown   WUJ:[id number] DOB: 10-28-1960  DOA: 09/14/2022 Date of Service: 09/18/22 PCP: Smiley Houseman, NP     Brief Narrative / Hospital Course:  Todd Brown is a 62 y.o. 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  Patient was in his usual state of health earlier and went to and completed his dialysis session but on arrival home he was lethargic.  On arrival of EMS he was saturating in the 70s on room air and was placed on NRB for transport. Of note: bacteremia secondary to strep gallolyticus in 01/24. Suspected source was from the PermCath (PermCath was removed during that hospitalization). Standard ECHO was obtained which did not show any vegetation.  ID was consulted and they requested TEE and colonscopy, however, the pt did not agree to have these procedures. Pt was continued on IV cefazolin 2g MWF with dialysis for strep gallolyticus bacteremia (END DATE Jul 20 2022).   04/15: ED course and data review: Tmax 104.2 with pulse 107 respirations 23 and O2 sat initially 89% on NRB improving to 94% on HFNC at 6 L.  BP was 154/86 on arrival.  VBG on HFNC showed pH 7.5 with pCO2 40.  Labs otherwise significant for WBC 12,100, lactic acid 0.9 and procalcitonin 0.72.  Respiratory viral panel negative for COVID flu and RSV.  Troponin 81. Patient started on cefepime vancomycin and Flagyl for sepsis of unknown source. Hospitalist consulted for admission.  04/16: Blood culture revealed strep species. Discontinued cefepime, vancomycin and Flagyl and place patient on Rocephin 2 g IV daily. Consulted ID - recs for TEE and  colonoscopy, AV graft evauation. Patient did not receive IV fluid resuscitation due to fluid overload. Removed 2L in HD 04/17: dialysis today - examined afterward. Pt has no complaints other than wants something to eat. Removed another 2L in HD.  04/18: cardiology consulted for TEE - await improvement in volume status, TEE will have to be w/ [facility] not [facility] d/t staffing. Repeating BCx. Will d/w guardian re: consent for TEE - she states he was amenable to TEE but did not want colonoscopy.  04/19: stable  Consultants:  Nephrology  Infectious Disease  Cardiology   Procedures: None       ASSESSMENT & PLAN:   Principal Problem:   Acute on chronic respiratory failure with hypoxia Active Problems:   Severe sepsis versus SIRS   CAP (community acquired pneumonia)   Anasarca (mild pulmonary edema, pleural and pericardial effusion, ascites on CT)   Acute metabolic encephalopathy   ESRD on dialysis   Essential hypertension   COPD (chronic obstructive pulmonary disease)   Schizophrenia   Essential hypertension, benign   Bacteremia   Acute on chronic respiratory failure  Severe sepsis  Community-acquired pneumonia  Streptococcus bacteremia.   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  Organ dysfunction of respiratory failure and  altered mental status. CT chest abdomen and pelvis consistent with anasarca.  Cannot rule out superimposed pulmonary infection Blood culture revealed strep species Continue Rocephin 2 g IV daily Consult ID --> will need TEE and colonoscopy, evaluate graft  Patient did not receive IV fluid resuscitation due to fluid overload, conitnue w/ HD Per his guardian, he was amenable to TEE last time but not to colonoscopy, will revisit this w/ him - he seems amenable now and guardian plan on signing consent  Goals of care Patient does not  demonstrate adequate understanding of his medical illness(es), he can not make an informed decision about complex treatments - consent or refusal.  Given frailty and multiple comorbidities, he is less likely to survive resuscitation measures in the event of a cardiopulmonary arrest but I am not convinced at this time that resuscitation would be utterly futile, either. He has no terminal illness at this time which would recommend strongly for DNR/DNI status, especially since he has not voiced that he would NOT desire CPR or life support measures  guardian will have to give consent/refusal for TEE or other procedures  Consider palliative care consult pending clinical course   Acute on chronic respiratory failure with hypoxia - improving Etiology acute on chronic diastolic CHF and CAP  Continue high flow nasal cannula to maintain sats over 92% and wean as tolerated Expect improvement in oxygenation following resolution of acute illness  Acute metabolic encephalopathy Secondary to sepsis from Streptococcus bacteremia Patient is oriented to person and place but not to time Monitor patient closely    Acute on chronic diastolic dysfunction CHF Anasarca  EF 06/2022 showing EF 60 to 65% with grade 1 diastolic dysfunction CT with findings of volume overload, mild pulmonary edema with pleural effusions, small volume pericardial effusion and anasarca and small volume fluid ascites Nephrology consult for management of fluid status   ESRD on dialysis Nephrology consult for renal replacement therapy   Essential hypertension Restart home meds    COPD (chronic obstructive pulmonary disease) Continue home inhalers DuoNebs as needed   Schizophrenia Continue Prolixin and carbamazepine  Vision impairment Precautions for fall   DVT prophylaxis: heparin Pertinent IV fluids/nutrition: no continuous IV fluids, cardiac diet  Central lines / invasive devices: none  Code Status: FULL CODE ACP  documentation reviewed: none on file   Current Admission Status: inpatient  TOC needs / Dispo plan: HH PT Barriers to discharge / significant pending items: bacteremia, pend repeat cultures, ID recs, fluid overload, TEE, IV abx, will be here several more days pending results     Lenn Sink - guardian [phone] = fax            Subjective / Brief ROS:  Patient reports no concerns  Denies CP/SOB.  Pain controlled.  Discussed TEE, he says "ok" but is not demonstrating complete comprehension, will revisit    Family Communication: called legal guardian to give update - [name] at [facility] 09/18/22 4:38 PM .     Objective Findings:  Vitals:   09/18/22 1430 09/18/22 1442 09/18/22 1500 09/18/22 1527  BP: (!) 142/78 (!) 147/81  (!) 147/85  Pulse: (!) 52 (!) 54  71  Resp: (!) 8 (!) 9    Temp:  98.1 F (36.7 C)  98.3 F (36.8 C)  TempSrc:  Oral    SpO2: 97% 100%  98%  Weight:   74.6 kg   Height:        Intake/Output Summary (Last 24 hours) at 09/18/2022 1638 Last data filed at 09/18/2022 1442 Gross per 24 hour  Intake 220 ml  Output 2000 ml  Net -1780 ml    Filed Weights   09/18/22 0958 09/18/22 1500  Weight: 77.5 kg 74.6 kg  Examination:  Physical Exam Constitutional:      General: He is not in acute distress. Cardiovascular:     Rate and Rhythm: Normal rate and regular rhythm.  Pulmonary:     Effort: Pulmonary effort is normal.     Breath sounds: Normal breath sounds.  Abdominal:     General: Abdomen is flat.     Palpations: Abdomen is soft.  Musculoskeletal:     Right lower leg: No edema.     Left lower leg: No edema.  Skin:    General: Skin is warm and dry.  Neurological:     General: No focal deficit present.     Mental Status: He is alert. Mental status is at baseline.  Psychiatric:        Behavior: Behavior normal.          Scheduled Medications:   amLODipine  10 mg Oral q morning   calcium acetate  1,334 mg Oral TID WC    carbamazepine  1,000 mg Oral q morning   Chlorhexidine Gluconate Cloth  6 each Topical Q0600   cholecalciferol  1,000 Units Oral q AM   cloNIDine  0.2 mg Transdermal Weekly   ferrous sulfate  325 mg Oral Daily   fluPHENAZine  2.5 mg Oral Daily   fluticasone furoate-vilanterol  1 puff Inhalation Daily   heparin  5,000 Units Subcutaneous Q8H   hydrALAZINE  10 mg Oral Q8H   isosorbide mononitrate  60 mg Oral BID   levothyroxine  50 mcg Oral Q0600   multivitamin  1 tablet Oral QHS   pantoprazole  40 mg Oral Daily   trihexyphenidyl  2 mg Oral BID   zinc sulfate  220 mg Oral Daily    Continuous Infusions:  cefTRIAXone (ROCEPHIN)  IV 2 g (09/18/22 0918)    PRN Medications:  acetaminophen **OR** acetaminophen, albuterol, HYDROcodone-acetaminophen, morphine injection, ondansetron **OR** ondansetron (ZOFRAN) IV, polyethylene glycol  Antimicrobials from admission:  Anti-infectives (From admission, onward)    Start     Dose/Rate Route Frequency Ordered Stop   09/16/22 2200  vancomycin (VANCOREADY) IVPB 750 mg/150 mL  Status:  Discontinued        750 mg 150 mL/hr over 60 Minutes Intravenous Every M-W-F (Hemodialysis) 09/15/22 0031 09/15/22 1329   09/15/22 1000  ceFEPIme (MAXIPIME) 2 g in sodium chloride 0.9 % 100 mL IVPB  Status:  Discontinued        2 g 200 mL/hr over 30 Minutes Intravenous Every 12 hours 09/15/22 0031 09/15/22 0734   09/15/22 0800  metroNIDAZOLE (FLAGYL) IVPB 500 mg  Status:  Discontinued        500 mg 100 mL/hr over 60 Minutes Intravenous Every 12 hours 09/14/22 2248 09/15/22 1329   09/15/22 0800  cefTRIAXone (ROCEPHIN) 2 g in sodium chloride 0.9 % 100 mL IVPB        2 g 200 mL/hr over 30 Minutes Intravenous Every 24 hours 09/15/22 0734     09/15/22 0000  vancomycin (VANCOREADY) IVPB 750 mg/150 mL        750 mg 150 mL/hr over 60 Minutes Intravenous  Once 09/14/22 2258 09/15/22 0200   09/14/22 1915  ceFEPIme (MAXIPIME) 2 g in sodium chloride 0.9 % 100 mL IVPB         2 g 200 mL/hr over 30 Minutes Intravenous  Once 09/14/22 1909 09/14/22 1949   09/14/22 1915  metroNIDAZOLE (FLAGYL) IVPB 500 mg        500 mg 100 mL/hr over 60  Minutes Intravenous  Once 09/14/22 1909 09/14/22 2052   09/14/22 1915  vancomycin (VANCOCIN) IVPB 1000 mg/200 mL premix        1,000 mg 200 mL/hr over 60 Minutes Intravenous  Once 09/14/22 1909 09/14/22 2210           Data Reviewed:  I have personally reviewed the following...  CBC: Recent Labs  Lab 09/14/22 1910 09/15/22 0752 09/17/22 0448 09/18/22 1120  WBC 12.1* 12.9* 5.1 3.7*  NEUTROABS 10.9*  --   --   --   HGB 13.2 13.3 11.6* 10.7*  HCT 40.0 40.2 35.2* 32.4*  MCV 101.8* 101.8* 101.7* 100.9*  PLT 183 186 112* 154    Basic Metabolic Panel: Recent Labs  Lab 09/14/22 1910 09/15/22 1123 09/17/22 0448 09/18/22 1120  NA 138 133* 136 134*  K 4.5 4.7 3.9 3.9  CL 95* 94* 96* 96*  CO2 GLUCOSE 80 98 89 151*  BUN 34* 44* 29* 29*  CREATININE 6.35* 7.30* 5.16* 4.74*  CALCIUM 9.2 9.1 8.7* 8.4*  PHOS  --  3.7  --  2.5    GFR: Estimated Creatinine Clearance: 17.3 mL/min (A) (by C-G formula based on SCr of 4.74 mg/dL (H)). Liver Function Tests: Recent Labs  Lab 09/14/22 1910 09/15/22 1123 09/18/22 1120  AST 31  --   --   ALT 17  --   --   ALKPHOS 102  --   --   BILITOT 1.0  --   --   PROT 7.3  --   --   ALBUMIN 3.7 2.9* 2.7*    No results for input(s): "LIPASE", "AMYLASE" in the last 168 hours. No results for input(s): "AMMONIA" in the last 168 hours. Coagulation Profile: Recent Labs  Lab 09/14/22 1910  INR 1.1    Cardiac Enzymes: No results for input(s): "CKTOTAL", "CKMB", "CKMBINDEX", "TROPONINI" in the last 168 hours. BNP (last 3 results) No results for input(s): "PROBNP" in the last 8760 hours. HbA1C: No results for input(s): "HGBA1C" in the last 72 hours. CBG: No results for input(s): "GLUCAP" in the last 168 hours. Lipid Profile: No results for input(s): "CHOL", "HDL",  "LDLCALC", "TRIG", "CHOLHDL", "LDLDIRECT" in the last 72 hours. Thyroid Function Tests: No results for input(s): "TSH", "T4TOTAL", "FREET4", "T3FREE", "THYROIDAB" in the last 72 hours. Anemia Panel: No results for input(s): "VITAMINB12", "FOLATE", "FERRITIN", "TIBC", "IRON", "RETICCTPCT" in the last 72 hours. Most Recent Urinalysis On File:     Component Value Date/Time   COLORURINE YELLOW (A) 05/17/2015 0012   APPEARANCEUR HAZY (A) 05/17/2015 0012   LABSPEC 1.009 05/17/2015 0012   PHURINE 7.0 05/17/2015 0012   GLUCOSEU NEGATIVE 05/17/2015 0012   HGBUR 1+ (A) 05/17/2015 0012   BILIRUBINUR NEGATIVE 05/17/2015 0012   KETONESUR NEGATIVE 05/17/2015 0012   PROTEINUR 30 (A) 05/17/2015 0012   NITRITE NEGATIVE 05/17/2015 0012   LEUKOCYTESUR 3+ (A) 05/17/2015 0012   Sepsis Labs: (procalcitonin:4,lacticidven:4) Microbiology: Recent Results (from the past 240 hour(s))  Resp panel by RT-PCR (RSV, Flu A&B, Covid) Anterior Nasal Swab     Status: None   Collection Time: 09/14/22  7:10 PM   Specimen: Anterior Nasal Swab  Result Value Ref Range Status   SARS Coronavirus 2 by RT PCR NEGATIVE NEGATIVE Final    Comment: (NOTE) SARS-CoV-2 target nucleic acids are NOT DETECTED.  The SARS-CoV-2 RNA is generally detectable in upper respiratory specimens during the acute phase of infection. The lowest concentration of SARS-CoV-2 viral copies this assay can  detect is 138 copies/mL. A negative result does not preclude SARS-Cov-2 infection and should not be used as the sole basis for treatment or other patient management decisions. A negative result may occur with  improper specimen collection/handling, submission of specimen other than nasopharyngeal swab, presence of viral mutation(s) within the areas targeted by this assay, and inadequate number of viral copies(<138 copies/mL). A negative result must be combined with clinical observations, patient history, and epidemiological information.  The expected result is Negative.  Fact Sheet for Patients:  BloggerCourse.com  Fact Sheet for Healthcare Providers:  SeriousBroker.it  This test is no t yet approved or cleared by the [place] FDA and  has been authorized for detection and/or diagnosis of SARS-CoV-2 by FDA under an Emergency Use Authorization (EUA). This EUA will remain  in effect (meaning this test can be used) for the duration of the COVID-19 declaration under Section 564(b)(1) of the Act, 21 U.S.C.section 360bbb-3(b)(1), unless the authorization is terminated  or revoked sooner.       Influenza A by PCR NEGATIVE NEGATIVE Final   Influenza B by PCR NEGATIVE NEGATIVE Final    Comment: (NOTE) The Xpert Xpress SARS-CoV-2/FLU/RSV plus assay is intended as an aid in the diagnosis of influenza from Nasopharyngeal swab specimens and should not be used as a sole basis for treatment. Nasal washings and aspirates are unacceptable for Xpert Xpress SARS-CoV-2/FLU/RSV testing.  Fact Sheet for Patients: BloggerCourse.com  Fact Sheet for Healthcare Providers: SeriousBroker.it  This test is not yet approved or cleared by the [place] FDA and has been authorized for detection and/or diagnosis of SARS-CoV-2 by FDA under an Emergency Use Authorization (EUA). This EUA will remain in effect (meaning this test can be used) for the duration of the COVID-19 declaration under Section 564(b)(1) of the Act, 21 U.S.C. section 360bbb-3(b)(1), unless the authorization is terminated or revoked.     Resp Syncytial Virus by PCR NEGATIVE NEGATIVE Final    Comment: (NOTE) Fact Sheet for Patients: BloggerCourse.com  Fact Sheet for Healthcare Providers: SeriousBroker.it  This test is not yet approved or cleared by the [place] FDA and has been authorized for detection and/or  diagnosis of SARS-CoV-2 by FDA under an Emergency Use Authorization (EUA). This EUA will remain in effect (meaning this test can be used) for the duration of the COVID-19 declaration under Section 564(b)(1) of the Act, 21 U.S.C. section 360bbb-3(b)(1), unless the authorization is terminated or revoked.  Performed at [facility], [address], [place], [place] [address]   Blood Culture (routine x 2)     Status: Abnormal   Collection Time: 09/14/22  7:10 PM   Specimen: BLOOD  Result Value Ref Range Status   Specimen Description   Final    BLOOD BLOOD RIGHT WRIST Performed at [facility], [address] [place], [place] [address]    Special Requests   Final    BOTTLES DRAWN AEROBIC AND ANAEROBIC Blood Culture results may not be optimal due to an inadequate volume of blood received in culture bottles Performed at [facility], [address], [place], [place] [address]    Culture  Setup Time   Final    GRAM POSITIVE COCCI IN BOTH AEROBIC AND ANAEROBIC BOTTLES CRITICAL RESULT CALLED TO, READ BACK BY AND VERIFIED WITH: Albertine Grates 09/15/22 0715 MW Performed at [facility], [address] [place], [place] [address]    Culture STREPTOCOCCUS GALLOLYTICUS (A)  Final   Report Status 09/17/2022 FINAL  Final   Organism ID, Bacteria STREPTOCOCCUS GALLOLYTICUS  Final      Susceptibility   Streptococcus gallolyticus - MIC*    PENICILLIN 0.12 SENSITIVE Sensitive     CEFTRIAXONE <=0.12 SENSITIVE Sensitive     ERYTHROMYCIN >=8 RESISTANT Resistant     LEVOFLOXACIN 2 SENSITIVE Sensitive     VANCOMYCIN 0.5 SENSITIVE Sensitive     * STREPTOCOCCUS GALLOLYTICUS  Blood Culture ID Panel (Reflexed)     Status: Abnormal   Collection Time: 09/14/22  7:10 PM  Result Value Ref Range Status   Enterococcus faecalis NOT DETECTED NOT DETECTED Final   Enterococcus Faecium NOT DETECTED NOT DETECTED Final   Listeria monocytogenes NOT DETECTED NOT DETECTED Final    Staphylococcus species NOT DETECTED NOT DETECTED Final   Staphylococcus aureus (BCID) NOT DETECTED NOT DETECTED Final   Staphylococcus epidermidis NOT DETECTED NOT DETECTED Final   Staphylococcus lugdunensis NOT DETECTED NOT DETECTED Final   Streptococcus species DETECTED (A) NOT DETECTED Final    Comment: Not Enterococcus species, Streptococcus agalactiae, Streptococcus pyogenes, or Streptococcus pneumoniae. CRITICAL RESULT CALLED TO, READ BACK BY AND VERIFIED WITH: TRAY GREENWOOD 09/15/22 0715 MW    Streptococcus agalactiae NOT DETECTED NOT DETECTED Final   Streptococcus pneumoniae NOT DETECTED NOT DETECTED Final   Streptococcus pyogenes NOT DETECTED NOT DETECTED Final   A.calcoaceticus-baumannii NOT DETECTED NOT DETECTED Final   Bacteroides fragilis NOT DETECTED NOT DETECTED Final   Enterobacterales NOT DETECTED NOT DETECTED Final   Enterobacter cloacae complex NOT DETECTED NOT DETECTED Final   Escherichia coli NOT DETECTED NOT DETECTED Final   Klebsiella aerogenes NOT DETECTED NOT DETECTED Final   Klebsiella oxytoca NOT DETECTED NOT DETECTED Final   Klebsiella pneumoniae NOT DETECTED NOT DETECTED Final   Proteus species NOT DETECTED NOT DETECTED Final   Salmonella species NOT DETECTED NOT DETECTED Final   Serratia marcescens NOT DETECTED NOT DETECTED Final   Haemophilus influenzae NOT DETECTED NOT DETECTED Final   Neisseria meningitidis NOT DETECTED NOT DETECTED Final   Pseudomonas aeruginosa NOT DETECTED NOT DETECTED Final   Stenotrophomonas maltophilia NOT DETECTED NOT DETECTED Final   Candida albicans NOT DETECTED NOT DETECTED Final   Candida auris NOT DETECTED NOT DETECTED Final   Candida glabrata NOT DETECTED NOT DETECTED Final   Candida krusei NOT DETECTED NOT DETECTED Final   Candida parapsilosis NOT DETECTED NOT DETECTED Final   Candida tropicalis NOT DETECTED NOT DETECTED Final   Cryptococcus neoformans/gattii NOT DETECTED NOT DETECTED Final    Comment: Performed at  [facility], [address] [place], [place] [address]  Blood Culture (routine x 2)     Status: Abnormal   Collection Time: 09/14/22  7:15 PM   Specimen: BLOOD  Result Value Ref Range Status   Specimen Description   Final    BLOOD RIGHT ANTECUBITAL Performed at [facility], [address] [place], [place] [address]    Special Requests   Final    BOTTLES DRAWN AEROBIC AND ANAEROBIC Blood Culture results may not be optimal due to an inadequate volume of blood received in culture bottles Performed at [facility], [address] [place], [place] [address]    Culture  Setup Time   Final    GRAM POSITIVE COCCI IN BOTH AEROBIC AND ANAEROBIC BOTTLES CRITICAL RESULT CALLED TO, READ BACK BY AND VERIFIED WITH: TRAY GREENWOOD 09/15/22 0715 MW CRITICAL VALUE NOTED.  VALUE IS CONSISTENT WITH PREVIOUSLY REPORTED AND CALLED VALUE.    Culture (A)  Final  STREPTOCOCCUS GALLOLYTICUS SUSCEPTIBILITIES PERFORMED ON PREVIOUS CULTURE WITHIN THE LAST 5 DAYS. Performed at [facility], [address], [name], [place] [address]    Report Status 09/17/2022 FINAL  Final  Culture, blood (Routine X 2) w Reflex to ID Panel     Status: None (Preliminary result)   Collection Time: 09/17/22  8:49 AM   Specimen: BLOOD  Result Value Ref Range Status   Specimen Description BLOOD RIGHT HAND  Final   Special Requests   Final    BOTTLES DRAWN AEROBIC ONLY Blood Culture adequate volume   Culture   Final    NO GROWTH < 24 HOURS Performed at [facility], [address], [place], [place] [address]    Report Status PENDING  Incomplete  Culture, blood (Routine X 2) w Reflex to ID Panel     Status: None (Preliminary result)   Collection Time: 09/17/22  8:49 AM   Specimen: BLOOD  Result Value Ref Range Status   Specimen Description BLOOD LEFT HAND  Final   Special Requests   Final    BOTTLES DRAWN AEROBIC AND ANAEROBIC Blood Culture results may not be optimal due to  an excessive volume of blood received in culture bottles   Culture   Final    NO GROWTH < 24 HOURS Performed at [facility], [address], [place], [place] [address]    Report Status PENDING  Incomplete      Radiology Studies last 3 days: ECHOCARDIOGRAM COMPLETE  Result Date: 09/17/2022    ECHOCARDIOGRAM REPORT   Patient Name:   DRAVEN NATTER Date of Exam: 09/17/2022 Medical Rec #:  [id number]        Height:       71.0 in Accession #:    [id number]       Weight:       175.0 lb Date of Birth:  01-02-1961        BSA:          1.992 m Patient Age:    61 years         BP:           163/89 mmHg Patient Gender: M                HR:           64 bpm. Exam Location:  [facility] Procedure: 2D Echo, Cardiac Doppler and Color Doppler Indications:     Bacteremia  History:         Patient has prior history of Echocardiogram examinations, most                  recent 06/20/2022. Previous Myocardial Infarction, COPD,                  Signs/Symptoms:Bacteremia and Altered Mental Status; Risk                  Factors:Hypertension. ESRD on Dialysis.  Sonographer:     Mikki Harbor Referring Phys:  [id number] Lynn Ito Diagnosing Phys: Julien Nordmann MD  Sonographer Comments: Image acquisition challenging due to uncooperative patient. IMPRESSIONS  1. No valve vegetation noted. Leaflets well visualized.  2. Left ventricular ejection fraction, by estimation, is 50 to 55%. The left ventricle has low normal function. The left ventricle has no regional wall motion abnormalities. There is severe left ventricular hypertrophy. Left ventricular diastolic parameters are consistent with Grade I diastolic dysfunction (impaired relaxation). There is the interventricular septum is  flattened in systole and diastole, consistent with right ventricular pressure and volume overload.  3. Right ventricular systolic function is moderately reduced. The right ventricular size is moderately enlarged. Moderately increased right  ventricular wall thickness. There is moderately elevated pulmonary artery systolic pressure. The estimated right ventricular systolic pressure is 59.1 mmHg.  4. Left atrial size was severely dilated.  5. Right atrial size was severely dilated.  6. The mitral valve is normal in structure. No evidence of mitral valve regurgitation. No evidence of mitral stenosis.  7. Tricuspid valve regurgitation is moderate to severe.  8. The aortic valve is normal in structure. Aortic valve regurgitation is mild. Aortic valve sclerosis is present, with no evidence of aortic valve stenosis.  9. There is mild dilatation of the aortic root, measuring 41 mm. 10. The inferior vena cava is normal in size with greater than 50% respiratory variability, suggesting right atrial pressure of 3 mmHg. FINDINGS  Left Ventricle: Left ventricular ejection fraction, by estimation, is 50 to 55%. The left ventricle has low normal function. The left ventricle has no regional wall motion abnormalities. The left ventricular internal cavity size was normal in size. There is severe left ventricular hypertrophy. The interventricular septum is flattened in systole and diastole, consistent with right ventricular pressure and volume overload. Left ventricular diastolic parameters are consistent with Grade I diastolic dysfunction (impaired relaxation). Right Ventricle: The right ventricular size is moderately enlarged. Moderately increased right ventricular wall thickness. Right ventricular systolic function is moderately reduced. There is moderately elevated pulmonary artery systolic pressure. The tricuspid regurgitant velocity is 3.32 m/s, and with an assumed right atrial pressure of 15 mmHg, the estimated right ventricular systolic pressure is 59.1 mmHg. Left Atrium: Left atrial size was severely dilated. Right Atrium: Right atrial size was severely dilated. Pericardium: Trivial pericardial effusion is present. Mitral Valve: The mitral valve is normal in  structure. No evidence of mitral valve regurgitation. No evidence of mitral valve stenosis. MV peak gradient, 3.3 mmHg. The mean mitral valve gradient is 1.0 mmHg. There is no evidence of mitral valve vegetation. Tricuspid Valve: The tricuspid valve is normal in structure. Tricuspid valve regurgitation is moderate to severe. No evidence of tricuspid stenosis. There is no evidence of tricuspid valve vegetation. Aortic Valve: The aortic valve is normal in structure. Aortic valve regurgitation is mild. Aortic regurgitation PHT measures 603 msec. Aortic valve sclerosis is present, with no evidence of aortic valve stenosis. Aortic valve mean gradient measures 4.0 mmHg. Aortic valve peak gradient measures 8.1 mmHg. Aortic valve area, by VTI measures 4.39 cm. There is no evidence of aortic valve vegetation. Pulmonic Valve: The pulmonic valve was normal in structure. Pulmonic valve regurgitation is mild to moderate. No evidence of pulmonic stenosis. There is no evidence of pulmonic valve vegetation. Aorta: The aortic root is normal in size and structure. There is mild dilatation of the aortic root, measuring 41 mm. Venous: The inferior vena cava is normal in size with greater than 50% respiratory variability, suggesting right atrial pressure of 3 mmHg. IAS/Shunts: No atrial level shunt detected by color flow Doppler.  LEFT VENTRICLE PLAX 2D LVIDd:         4.20 cm   Diastology LVIDs:         2.90 cm   LV e' medial:  9.79 cm/s LV PW:         1.70 cm   LV e' lateral: 8.70 cm/s LV IVS:        2.40 cm LVOT diam:  2.50 cm LV SV:         117 LV SV Index:   59 LVOT Area:     4.91 cm  RIGHT VENTRICLE RV Basal diam:  5.35 cm RV Mid diam:    4.50 cm RV S prime:     13.70 cm/s TAPSE (M-mode): 2.4 cm LEFT ATRIUM              Index        RIGHT ATRIUM           Index LA diam:        4.70 cm  2.36 cm/m   RA Area:     34.70 cm LA Vol (A2C):   119.0 ml 59.73 ml/m  RA Volume:   152.00 ml 76.30 ml/m LA Vol (A4C):   85.3 ml  42.82  ml/m LA Biplane Vol: 102.0 ml 51.20 ml/m  AORTIC VALVE                    PULMONIC VALVE AV Area (Vmax):    4.67 cm     PV Vmax:       0.91 m/s AV Area (Vmean):   4.50 cm     PV Peak grad:  3.3 mmHg AV Area (VTI):     4.39 cm AV Vmax:           142.00 cm/s AV Vmean:          85.400 cm/s AV VTI:            0.267 m AV Peak Grad:      8.1 mmHg AV Mean Grad:      4.0 mmHg LVOT Vmax:         135.00 cm/s LVOT Vmean:        78.300 cm/s LVOT VTI:          0.239 m LVOT/AV VTI ratio: 0.90 AI PHT:            603 msec  AORTA Ao Root diam: 4.10 cm Ao Asc diam:  3.20 cm MITRAL VALVE              TRICUSPID VALVE MV Area (PHT): 2.02 cm   TR Peak grad:   44.1 mmHg MV Area VTI:   3.34 cm   TR Vmax:        332.00 cm/s MV Peak grad:  3.3 mmHg MV Mean grad:  1.0 mmHg   SHUNTS MV Vmax:       0.91 m/s   Systemic VTI:  0.24 m MV Vmean:      55.5 cm/s  Systemic Diam: 2.50 cm Julien Nordmann MD Electronically signed by Julien Nordmann MD Signature Date/Time: 09/17/2022/4:38:57 PM    Final    CT CHEST ABDOMEN PELVIS W CONTRAST  Result Date: 09/14/2022 CLINICAL DATA:  Sepsis. Pt with c/o lethargy after dialysis today. Pt received full tx at dialysis. Hx of COPD, wears 02 at 2L via [place]. EMS note pt to be 76% upon arrival to scene. EXAM: CT CHEST, ABDOMEN, AND PELVIS WITH CONTRAST TECHNIQUE: Multidetector CT imaging of the chest, abdomen and pelvis was performed following the standard protocol during bolus administration of intravenous contrast. RADIATION DOSE REDUCTION: This exam was performed according to the departmental dose-optimization program which includes automated exposure control, adjustment of the mA and/or kV according to patient size and/or use of iterative reconstruction technique. CONTRAST:  OMNIPAQUE IOHEXOL 300 MG/ML  SOLN COMPARISON:  CT chest 06/19/2022 FINDINGS: CT CHEST FINDINGS  Cardiovascular: Enlarged heart size. Small volume pericardial effusion. The thoracic aorta is normal in caliber. Mild atherosclerotic  plaque of the thoracic aorta. Four vessel coronary artery calcifications. The main pulmonary artery is borderline enlarged measuring up to 3.1 cm. No central pulmonary embolus. Mediastinum/Nodes: No enlarged mediastinal, hilar, or axillary lymph nodes. Thyroid gland, trachea, and esophagus demonstrate no significant findings. Lungs/Pleura: Interlobular septal wall thickening. Bibasilar passive atelectasis. Lingular ground-glass airspace opacity likely atelectasis. No focal consolidation. No pulmonary nodule. No pulmonary mass. Bilateral trace, left greater than right, pleural effusions. No pneumothorax. Musculoskeletal: No chest wall abnormality.  Bilateral gynecomastia. No suspicious lytic or blastic osseous lesions. No acute displaced fracture. Old healed sternal fracture. CT ABDOMEN PELVIS FINDINGS Hepatobiliary: No focal liver abnormality. No gallstones, gallbladder wall thickening, or pericholecystic fluid. No biliary dilatation. Pancreas: No focal lesion. Normal pancreatic contour. No surrounding inflammatory changes. No main pancreatic ductal dilatation. Spleen: Normal in size without focal abnormality. Adrenals/Urinary Tract: Bilateral adrenal gland hyperplasia with a poorly visualized 2 cm left adrenal gland nodule demonstrating a density of 7 Hounsfield units-consistent with a lipid rich adenoma, no further follow-up indicated. Bilateral renal cortical scarring. Bilateral kidneys enhance symmetrically. Fluid density lesions likely represent simple renal cysts. Simple renal cysts, in the absence of clinically indicated signs/symptoms, require no independent follow-up. No hydronephrosis. No hydroureter. The urinary bladder is unremarkable. No excretion of intravenous contrast on delayed imaging. Stomach/Bowel: Stomach is within normal limits. No evidence of bowel wall thickening or dilatation. Appendix appears normal. Vascular/Lymphatic: No abdominal aorta or iliac aneurysm. At least moderate atherosclerotic  plaque of the aorta and its branches. No abdominal, pelvic, or inguinal lymphadenopathy. Reproductive: Prostate is unremarkable. Other: At least small volume simple free fluid within the abdomen pelvis. No intraperitoneal free gas. No organized fluid collection. Musculoskeletal: No abdominal wall hernia or abnormality. No suspicious lytic or blastic osseous lesions. No acute displaced fracture. IMPRESSION: 1. Findings of volume overload. 2. Mild pulmonary edema with bilateral trace, left greater than right, pleural effusions. Associated bilateral lower lobe and lingular atelectasis. Superimposed infection not excluded. 3. Cardiomegaly. 4. Small volume pericardial effusion. 5. At least small volume simple free fluid ascites. 6. Anasarca. 7. Aortic Atherosclerosis (ICD10-I70.0) including four-vessel coronary calcification. 8. No excretion of intravenous contrast on delayed imaging. Correlate with creatinine levels. Electronically Signed   By: Tish Frederickson M.D.   On: 09/14/2022 21:25   DG Chest Port 1 View  Result Date: 09/14/2022 CLINICAL DATA:  Questionable sepsis - evaluate for abnormality Lethargy after dialysis today. EXAM: PORTABLE CHEST 1 VIEW COMPARISON:  Radiograph and CT 06/19/2022 FINDINGS: The previous right-sided dialysis catheter is been removed. Cardiomegaly is unchanged. Mediastinal contours are unchanged. Small bilateral pleural effusions which have increased from prior exam. Diffuse interstitial thickening may represent pulmonary edema superimposed on emphysema. No pneumothorax. IMPRESSION: Unchanged cardiomegaly. Small bilateral pleural effusions have increased from prior exam. Diffuse interstitial thickening may represent pulmonary edema superimposed on emphysema. Overall findings suggest fluid overload/CHF. Electronically Signed   By: Narda Rutherford M.D.   On: 09/14/2022 19:43             LOS: 4 days        Sunnie Nielsen, DO [facility] 09/18/2022, 4:38 PM     Dictation software may have been used to generate the above note. Typos may occur and escape review in typed/dictated notes. Please contact Dr Lyn Hollingshead directly for clarity if needed.  Staff may message me via secure chat in Epic  but this may not receive  an immediate response,  please page me for urgent matters!  If 7PM-7AM, please contact night coverage [email or web address]

## 2022-09-18 NOTE — Progress Notes (Deleted)
Pt is refusing all meds at this time. States, "Like that doctor said, I need to rest." Pt educated on the importance of adherence to medication orders. Pt refused. Plan of care ongoing.

## 2022-09-18 NOTE — Progress Notes (Signed)
OT Cancellation Note  Patient Details Name: CAEDAN SUMLER MRN: [id number] DOB: 1961-05-06   Cancelled Treatment:    Reason Eval/Treat Not Completed: Patient at procedure or test/ unavailable. Pt off the floor at HD.   Alvester Morin 09/18/2022, 11:14 AM

## 2022-09-18 NOTE — Progress Notes (Signed)
Patient returned from Hemodialysis.  

## 2022-09-18 NOTE — Progress Notes (Signed)
Order received to discontinue telemetry 

## 2022-09-18 NOTE — TOC Progression Note (Signed)
Transition of Care [facility]) - Progression Note    Patient Details  Name: Todd Brown MRN: [id number] Date of Birth: 12/17/1960  [facility]) CM/SW Contact  Chapman Fitch, RN Phone Number: 09/18/2022, 9:48 AM  Clinical Narrative:     Per MD patient not medically ready for discharge today TEE still pending Per ID notes, plan to receive IV antibiotics during HD outpatient        Expected Discharge Plan and Services                                               Social Determinants of Health (SDOH) Interventions SDOH Screenings   Food Insecurity: No Food Insecurity (09/16/2022)  Housing: Low Risk  (09/16/2022)  Transportation Needs: No Transportation Needs (09/16/2022)  Utilities: Not At Risk (09/16/2022)  Alcohol Screen: Low Risk  (10/05/2019)  Financial Resource Strain: Low Risk  (10/08/2018)  Physical Activity: Unknown (10/08/2018)  Social Connections: Unknown (10/08/2018)  Stress: No Stress Concern Present (10/08/2018)  Tobacco Use: Low Risk  (09/16/2022)    Readmission Risk Interventions    09/17/2022   12:00 PM  Readmission Risk Prevention Plan  Transportation Screening Complete  Medication Review (RN Care Manager) Complete  HRI or Home Care Consult Complete  Skilled Nursing Facility Not Applicable

## 2022-09-18 NOTE — Progress Notes (Signed)
Physical Therapy Treatment Patient Details Name: Todd Brown MRN: [id number] DOB: 09-13-1960 Today's Date: 09/18/2022   History of Present Illness Pt is a 62 y.o. 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 MD assessment includes: severe sepsis, community-acquired pneumonia, streptococcus bacteremia, acute on chronic respiratory failure with hypoxia, acute metabolic encephalopathy, acute on chronic diastolic dysfunction CHF, and anasarca.    PT Comments    Pt was long sitting in bed upon arrival. He agrees to session and seems to be at his baseline cognition. He demonstrated much improved abilities today versus previous afternoon. Pt did not remember working with PT yesterday. He was able to exit R side of bed with increased time. Did stand EOB 3 x with CGA. Pt unwilling to ambulate away form EOB however was easily able to march in place alternating 20 xs 2 sets. Pt tolerated well overall. Seems to be close to his baseline abilities. Acute PT will continue to follow throughout remainder of acute hospital  admission.      Recommendations for follow up therapy are one component of a multi-disciplinary discharge planning process, led by the attending physician.  Recommendations may be updated based on patient status, additional functional criteria and insurance authorization.     Assistance Recommended at Discharge Intermittent Supervision/Assistance  Patient can return home with the following A little help with walking and/or transfers;A lot of help with bathing/dressing/bathroom;Assistance with cooking/housework;Direct supervision/assist for medications management;Direct supervision/assist for financial  management;Assist for transportation;Help with stairs or ramp for entrance   Equipment Recommendations  None recommended by PT (Pt endorses having all equipment need met)       Precautions / Restrictions Precautions Precautions: Fall Restrictions Weight Bearing Restrictions: No     Mobility  Bed Mobility Overal bed mobility: Needs Assistance Bed Mobility: Supine to Sit, Sit to Supine  Supine to sit: Supervision Sit to supine: Supervision     Transfers Overall transfer level: Needs assistance Equipment used: Rolling walker (2 wheels) Transfers: Sit to/from Stand Sit to Stand: Min guard General transfer comment: CGA for safety with Vcs for improved technique and safety. no physical lifting assistance.    Ambulation/Gait    General Gait Details: Pt was able to march EOB 2 x 20 alternating but was unwilling to ambulate away form bed. pt does not ambulate much at baseline. Mostly spends days in w/c for community distances    Balance Overall balance assessment: Needs assistance Sitting-balance support: Feet supported Sitting balance-[name]: Good     Standing balance support: Bilateral upper extremity supported, During functional activity, Reliant on assistive device for balance Standing balance-[name]: Fair Standing balance comment: pt requires CGA for safety during all standing activity         Cognition Arousal/Alertness: Awake/alert Behavior During Therapy: WFL for tasks assessed/performed Overall Cognitive Status: Within Functional Limits for tasks assessed      General Comments: Pt is well known by author from previous admissions. Seems to be close to his baseline cognitively.               Pertinent Vitals/Pain Pain Assessment Pain Assessment: No/denies pain     PT Goals (current goals can now be found in the care plan section) Acute Rehab PT Goals Patient Stated Goal: return home (  golden years) Progress towards PT goals: Progressing toward  goals    Frequency    Min 3X/week      PT Plan Discharge plan needs to be updated       AM-PAC PT "6 Clicks" Mobility   Outcome Measure  Help needed turning from your back to your side while in a flat bed without using bedrails?: A Little Help needed moving from lying on your back to sitting on the side of a flat bed without using bedrails?: A Little Help needed moving to and from a bed to a chair (including a wheelchair)?: A Little Help needed standing up from a chair using your arms (e.g., wheelchair or bedside chair)?: A Little Help needed to walk in hospital room?: A Lot Help needed climbing 3-5 steps with a railing? : A Lot 6 Click Score: 16    End of Session Equipment Utilized During Treatment: Gait belt Activity Tolerance: Patient tolerated treatment well Patient left: in bed;with call bell/phone within reach;with bed alarm set Nurse Communication: Mobility status PT Visit Diagnosis: Difficulty in walking, not elsewhere classified (R26.2);Muscle weakness (generalized) (M62.81)     Time: 9147-8295 PT Time Calculation (min) (ACUTE ONLY): 17 min  Charges:  $Therapeutic Activity: 8-22 mins                    Jetta Lout PTA 09/18/22, 10:21 AM

## 2022-09-18 NOTE — Progress Notes (Signed)
[facility]  ROUNDING NOTE   Subjective:   Todd Brown is a 62 year old male with past medical conditions including COPD, hypertension, DVT, schizophrenia, and end-stage renal disease on hemodialysis.  Patient presents to the emergency department with lethargy.   Patient is known to our practice and receives outpatient dialysis treatments at [facility] on a MWF schedule, supervised by Dr. Cherylann Ratel.   Patient seen and evaluated during dialysis   HEMODIALYSIS FLOWSHEET:  Blood Flow Rate (mL/min): 350 mL/min Arterial Pressure (mmHg): -120 mmHg Venous Pressure (mmHg): 360 mmHg TMP (mmHg): 16 mmHg Ultrafiltration Rate (mL/min): 808 mL/min Dialysate Flow Rate (mL/min): 300 ml/min  Patient somnolent  Objective:  Vital signs in last 24 hours:  Temp:  [98.3 F (36.8 C)-98.6 F (37 C)] 98.3 F (36.8 C) (04/19 0842) Pulse Rate:  [53-84] 55 (04/19 1300) Resp:  [13-20] 14 (04/19 1300) BP: (123-169)/(74-99) 123/74 (04/19 1300) SpO2:  [95 %-100 %] 100 % (04/19 1300)  Weight change:  Filed Weights   09/14/22 1907  Weight: 79.4 kg    Intake/Output: I/O last 3 completed shifts: In: 580 [P.O.:480; IV Piggyback:100] Out: -    Intake/Output this shift:  Total I/O In: 120 [P.O.:120] Out: -   Physical Exam: General: NAD  Head: Normocephalic, atraumatic. Moist oral mucosal membranes  Eyes: Anicteric  Lungs:  Diminished in bases, normal effort, [place] O2  Heart: Regular rate and rhythm  Abdomen:  Soft, nontender  Extremities:  No peripheral edema.  Neurologic: Somnolent  Skin: No lesions  Access: Lt avf    Basic Metabolic Panel: Recent Labs  Lab 09/14/22 1910 09/15/22 1123 09/17/22 0448 09/18/22 1120  NA 138 133* 136 134*  K 4.5 4.7 3.9 3.9  CL 95* 94* 96* 96*  CO2 29 27 29 29   GLUCOSE 80 98 89 151*  BUN 34* 44* 29* 29*  CREATININE 6.35* 7.30* 5.16* 4.74*  CALCIUM 9.2 9.1 8.7* 8.4*  PHOS  --  3.7  --  2.5     Liver Function Tests: Recent  Labs  Lab 09/14/22 1910 09/15/22 1123 09/18/22 1120  AST 31  --   --   ALT 17  --   --   ALKPHOS 102  --   --   BILITOT 1.0  --   --   PROT 7.3  --   --   ALBUMIN 3.7 2.9* 2.7*    No results for input(s): "LIPASE", "AMYLASE" in the last 168 hours. No results for input(s): "AMMONIA" in the last 168 hours.  CBC: Recent Labs  Lab 09/14/22 1910 09/15/22 0752 09/17/22 0448 09/18/22 1120  WBC 12.1* 12.9* 5.1 3.7*  NEUTROABS 10.9*  --   --   --   HGB 13.2 13.3 11.6* 10.7*  HCT 40.0 40.2 35.2* 32.4*  MCV 101.8* 101.8* 101.7* 100.9*  PLT 183 186 112* 154     Cardiac Enzymes: No results for input(s): "CKTOTAL", "CKMB", "CKMBINDEX", "TROPONINI" in the last 168 hours.  BNP: Invalid input(s): "POCBNP"  CBG: No results for input(s): "GLUCAP" in the last 168 hours.  Microbiology: Results for orders placed or performed during the hospital encounter of 09/14/22  Resp panel by RT-PCR (RSV, Flu A&B, Covid) Anterior Nasal Swab     Status: None   Collection Time: 09/14/22  7:10 PM   Specimen: Anterior Nasal Swab  Result Value Ref Range Status   SARS Coronavirus 2 by RT PCR NEGATIVE NEGATIVE Final    Comment: (NOTE) SARS-CoV-2 target nucleic acids are NOT DETECTED.  The SARS-CoV-2 RNA is generally detectable in upper respiratory specimens during the acute phase of infection. The lowest concentration of SARS-CoV-2 viral copies this assay can detect is 138 copies/mL. A negative result does not preclude SARS-Cov-2 infection and should not be used as the sole basis for treatment or other patient management decisions. A negative result may occur with  improper specimen collection/handling, submission of specimen other than nasopharyngeal swab, presence of viral mutation(s) within the areas targeted by this assay, and inadequate number of viral copies(<138 copies/mL). A negative result must be combined with clinical observations, patient history, and epidemiological information. The  expected result is Negative.  Fact Sheet for Patients:  BloggerCourse.com  Fact Sheet for Healthcare Providers:  SeriousBroker.it  This test is no t yet approved or cleared by the [place] FDA and  has been authorized for detection and/or diagnosis of SARS-CoV-2 by FDA under an Emergency Use Authorization (EUA). This EUA will remain  in effect (meaning this test can be used) for the duration of the COVID-19 declaration under Section 564(b)(1) of the Act, 21 U.S.C.section 360bbb-3(b)(1), unless the authorization is terminated  or revoked sooner.       Influenza A by PCR NEGATIVE NEGATIVE Final   Influenza B by PCR NEGATIVE NEGATIVE Final    Comment: (NOTE) The Xpert Xpress SARS-CoV-2/FLU/RSV plus assay is intended as an aid in the diagnosis of influenza from Nasopharyngeal swab specimens and should not be used as a sole basis for treatment. Nasal washings and aspirates are unacceptable for Xpert Xpress SARS-CoV-2/FLU/RSV testing.  Fact Sheet for Patients: BloggerCourse.com  Fact Sheet for Healthcare Providers: SeriousBroker.it  This test is not yet approved or cleared by the [place] FDA and has been authorized for detection and/or diagnosis of SARS-CoV-2 by FDA under an Emergency Use Authorization (EUA). This EUA will remain in effect (meaning this test can be used) for the duration of the COVID-19 declaration under Section 564(b)(1) of the Act, 21 U.S.C. section 360bbb-3(b)(1), unless the authorization is terminated or revoked.     Resp Syncytial Virus by PCR NEGATIVE NEGATIVE Final    Comment: (NOTE) Fact Sheet for Patients: BloggerCourse.com  Fact Sheet for Healthcare Providers: SeriousBroker.it  This test is not yet approved or cleared by the [place] FDA and has been authorized for detection and/or  diagnosis of SARS-CoV-2 by FDA under an Emergency Use Authorization (EUA). This EUA will remain in effect (meaning this test can be used) for the duration of the COVID-19 declaration under Section 564(b)(1) of the Act, 21 U.S.C. section 360bbb-3(b)(1), unless the authorization is terminated or revoked.  Performed at [facility], [address], [place], [place] [address]   Blood Culture (routine x 2)     Status: Abnormal   Collection Time: 09/14/22  7:10 PM   Specimen: BLOOD  Result Value Ref Range Status   Specimen Description   Final    BLOOD BLOOD RIGHT WRIST Performed at [facility], [address] [place], [place] [address]    Special Requests   Final    BOTTLES DRAWN AEROBIC AND ANAEROBIC Blood Culture results may not be optimal due to an inadequate volume of blood received in culture bottles Performed at [facility], [address], [place], [place] [address]    Culture  Setup Time   Final    GRAM POSITIVE COCCI IN BOTH AEROBIC AND ANAEROBIC BOTTLES CRITICAL RESULT CALLED TO, READ BACK BY AND VERIFIED WITH: Albertine Grates 09/15/22 0715 MW Performed at [facility]  [facility], [address], [place], [place] [address]    Culture STREPTOCOCCUS GALLOLYTICUS (A)  Final   Report Status 09/17/2022 FINAL  Final   Organism ID, Bacteria STREPTOCOCCUS GALLOLYTICUS  Final      Susceptibility   Streptococcus gallolyticus - MIC*    PENICILLIN 0.12 SENSITIVE Sensitive     CEFTRIAXONE <=0.12 SENSITIVE Sensitive     ERYTHROMYCIN >=8 RESISTANT Resistant     LEVOFLOXACIN 2 SENSITIVE Sensitive     VANCOMYCIN 0.5 SENSITIVE Sensitive     * STREPTOCOCCUS GALLOLYTICUS  Blood Culture ID Panel (Reflexed)     Status: Abnormal   Collection Time: 09/14/22  7:10 PM  Result Value Ref Range Status   Enterococcus faecalis NOT DETECTED NOT DETECTED Final   Enterococcus Faecium NOT DETECTED NOT DETECTED Final   Listeria monocytogenes NOT DETECTED NOT DETECTED Final    Staphylococcus species NOT DETECTED NOT DETECTED Final   Staphylococcus aureus (BCID) NOT DETECTED NOT DETECTED Final   Staphylococcus epidermidis NOT DETECTED NOT DETECTED Final   Staphylococcus lugdunensis NOT DETECTED NOT DETECTED Final   Streptococcus species DETECTED (A) NOT DETECTED Final    Comment: Not Enterococcus species, Streptococcus agalactiae, Streptococcus pyogenes, or Streptococcus pneumoniae. CRITICAL RESULT CALLED TO, READ BACK BY AND VERIFIED WITH: TRAY GREENWOOD 09/15/22 0715 MW    Streptococcus agalactiae NOT DETECTED NOT DETECTED Final   Streptococcus pneumoniae NOT DETECTED NOT DETECTED Final   Streptococcus pyogenes NOT DETECTED NOT DETECTED Final   A.calcoaceticus-baumannii NOT DETECTED NOT DETECTED Final   Bacteroides fragilis NOT DETECTED NOT DETECTED Final   Enterobacterales NOT DETECTED NOT DETECTED Final   Enterobacter cloacae complex NOT DETECTED NOT DETECTED Final   Escherichia coli NOT DETECTED NOT DETECTED Final   Klebsiella aerogenes NOT DETECTED NOT DETECTED Final   Klebsiella oxytoca NOT DETECTED NOT DETECTED Final   Klebsiella pneumoniae NOT DETECTED NOT DETECTED Final   Proteus species NOT DETECTED NOT DETECTED Final   Salmonella species NOT DETECTED NOT DETECTED Final   Serratia marcescens NOT DETECTED NOT DETECTED Final   Haemophilus influenzae NOT DETECTED NOT DETECTED Final   Neisseria meningitidis NOT DETECTED NOT DETECTED Final   Pseudomonas aeruginosa NOT DETECTED NOT DETECTED Final   Stenotrophomonas maltophilia NOT DETECTED NOT DETECTED Final   Candida albicans NOT DETECTED NOT DETECTED Final   Candida auris NOT DETECTED NOT DETECTED Final   Candida glabrata NOT DETECTED NOT DETECTED Final   Candida krusei NOT DETECTED NOT DETECTED Final   Candida parapsilosis NOT DETECTED NOT DETECTED Final   Candida tropicalis NOT DETECTED NOT DETECTED Final   Cryptococcus neoformans/gattii NOT DETECTED NOT DETECTED Final    Comment: Performed at  [facility], [address] [place], [place] [address]  Blood Culture (routine x 2)     Status: Abnormal   Collection Time: 09/14/22  7:15 PM   Specimen: BLOOD  Result Value Ref Range Status   Specimen Description   Final    BLOOD RIGHT ANTECUBITAL Performed at [facility], [address] [place], [place] [address]    Special Requests   Final    BOTTLES DRAWN AEROBIC AND ANAEROBIC Blood Culture results may not be optimal due to an inadequate volume of blood received in culture bottles Performed at [facility], [address] [place], [place] [address]    Culture  Setup Time   Final    GRAM POSITIVE COCCI IN BOTH AEROBIC AND ANAEROBIC BOTTLES CRITICAL RESULT CALLED TO, READ BACK BY AND VERIFIED WITH: TRAY GREENWOOD  09/15/22 0715 MW CRITICAL VALUE NOTED.  VALUE IS CONSISTENT WITH PREVIOUSLY REPORTED AND CALLED VALUE.    Culture (A)  Final    STREPTOCOCCUS GALLOLYTICUS SUSCEPTIBILITIES PERFORMED ON PREVIOUS CULTURE WITHIN THE LAST 5 DAYS. Performed at [facility], [address] [place], [place] [address]    Report Status 09/17/2022 FINAL  Final  Culture, blood (Routine X 2) w Reflex to ID Panel     Status: None (Preliminary result)   Collection Time: 09/17/22  8:49 AM   Specimen: BLOOD  Result Value Ref Range Status   Specimen Description BLOOD RIGHT HAND  Final   Special Requests   Final    BOTTLES DRAWN AEROBIC ONLY Blood Culture adequate volume   Culture   Final    NO GROWTH < 24 HOURS Performed at [facility], [address] [place], [place] [address]    Report Status PENDING  Incomplete  Culture, blood (Routine X 2) w Reflex to ID Panel     Status: None (Preliminary result)   Collection Time: 09/17/22  8:49 AM   Specimen: BLOOD  Result Value Ref Range Status   Specimen Description BLOOD LEFT HAND  Final   Special Requests   Final    BOTTLES DRAWN AEROBIC AND ANAEROBIC Blood Culture results may not be optimal due to  an excessive volume of blood received in culture bottles   Culture   Final    NO GROWTH < 24 HOURS Performed at [facility], [address] [place], [place] [address]    Report Status PENDING  Incomplete    Coagulation Studies: No results for input(s): "LABPROT", "INR" in the last 72 hours.   Urinalysis: No results for input(s): "COLORURINE", "LABSPEC", "PHURINE", "GLUCOSEU", "HGBUR", "BILIRUBINUR", "KETONESUR", "PROTEINUR", "UROBILINOGEN", "NITRITE", "LEUKOCYTESUR" in the last 72 hours.  Invalid input(s): "APPERANCEUR"    Imaging: ECHOCARDIOGRAM COMPLETE  Result Date: 09/17/2022    ECHOCARDIOGRAM REPORT   Patient Name:   Todd Brown Date of Exam: 09/17/2022 Medical Rec #:  [id number]        Height:       71.0 in Accession #:    [id number]       Weight:       175.0 lb Date of Birth:  May 27, 1961        BSA:          1.992 m Patient Age:    61 years         BP:           163/89 mmHg Patient Gender: M                HR:           64 bpm. Exam Location:  [facility] Procedure: 2D Echo, Cardiac Doppler and Color Doppler Indications:     Bacteremia  History:         Patient has prior history of Echocardiogram examinations, most                  recent 06/20/2022. Previous Myocardial Infarction, COPD,                  Signs/Symptoms:Bacteremia and Altered Mental Status; Risk                  Factors:Hypertension. ESRD on Dialysis.  Sonographer:     Mikki Harbor Referring Phys:  [id number] Lynn Ito Diagnosing Phys: Julien Nordmann MD  Sonographer Comments: Image acquisition challenging due to uncooperative patient.  IMPRESSIONS  1. No valve vegetation noted. Leaflets well visualized.  2. Left ventricular ejection fraction, by estimation, is 50 to 55%. The left ventricle has low normal function. The left ventricle has no regional wall motion abnormalities. There is severe left ventricular hypertrophy. Left ventricular diastolic parameters are consistent with Grade I diastolic  dysfunction (impaired relaxation). There is the interventricular septum is flattened in systole and diastole, consistent with right ventricular pressure and volume overload.  3. Right ventricular systolic function is moderately reduced. The right ventricular size is moderately enlarged. Moderately increased right ventricular wall thickness. There is moderately elevated pulmonary artery systolic pressure. The estimated right ventricular systolic pressure is 59.1 mmHg.  4. Left atrial size was severely dilated.  5. Right atrial size was severely dilated.  6. The mitral valve is normal in structure. No evidence of mitral valve regurgitation. No evidence of mitral stenosis.  7. Tricuspid valve regurgitation is moderate to severe.  8. The aortic valve is normal in structure. Aortic valve regurgitation is mild. Aortic valve sclerosis is present, with no evidence of aortic valve stenosis.  9. There is mild dilatation of the aortic root, measuring 41 mm. 10. The inferior vena cava is normal in size with greater than 50% respiratory variability, suggesting right atrial pressure of 3 mmHg. FINDINGS  Left Ventricle: Left ventricular ejection fraction, by estimation, is 50 to 55%. The left ventricle has low normal function. The left ventricle has no regional wall motion abnormalities. The left ventricular internal cavity size was normal in size. There is severe left ventricular hypertrophy. The interventricular septum is flattened in systole and diastole, consistent with right ventricular pressure and volume overload. Left ventricular diastolic parameters are consistent with Grade I diastolic dysfunction (impaired relaxation). Right Ventricle: The right ventricular size is moderately enlarged. Moderately increased right ventricular wall thickness. Right ventricular systolic function is moderately reduced. There is moderately elevated pulmonary artery systolic pressure. The tricuspid regurgitant velocity is 3.32 m/s, and with an  assumed right atrial pressure of 15 mmHg, the estimated right ventricular systolic pressure is 59.1 mmHg. Left Atrium: Left atrial size was severely dilated. Right Atrium: Right atrial size was severely dilated. Pericardium: Trivial pericardial effusion is present. Mitral Valve: The mitral valve is normal in structure. No evidence of mitral valve regurgitation. No evidence of mitral valve stenosis. MV peak gradient, 3.3 mmHg. The mean mitral valve gradient is 1.0 mmHg. There is no evidence of mitral valve vegetation. Tricuspid Valve: The tricuspid valve is normal in structure. Tricuspid valve regurgitation is moderate to severe. No evidence of tricuspid stenosis. There is no evidence of tricuspid valve vegetation. Aortic Valve: The aortic valve is normal in structure. Aortic valve regurgitation is mild. Aortic regurgitation PHT measures 603 msec. Aortic valve sclerosis is present, with no evidence of aortic valve stenosis. Aortic valve mean gradient measures 4.0 mmHg. Aortic valve peak gradient measures 8.1 mmHg. Aortic valve area, by VTI measures 4.39 cm. There is no evidence of aortic valve vegetation. Pulmonic Valve: The pulmonic valve was normal in structure. Pulmonic valve regurgitation is mild to moderate. No evidence of pulmonic stenosis. There is no evidence of pulmonic valve vegetation. Aorta: The aortic root is normal in size and structure. There is mild dilatation of the aortic root, measuring 41 mm. Venous: The inferior vena cava is normal in size with greater than 50% respiratory variability, suggesting right atrial pressure of 3 mmHg. IAS/Shunts: No atrial level shunt detected by color flow Doppler.  LEFT VENTRICLE PLAX 2D LVIDd:  4.20 cm   Diastology LVIDs:         2.90 cm   LV e' medial:  9.79 cm/s LV PW:         1.70 cm   LV e' lateral: 8.70 cm/s LV IVS:        2.40 cm LVOT diam:     2.50 cm LV SV:         117 LV SV Index:   59 LVOT Area:     4.91 cm  RIGHT VENTRICLE RV Basal diam:  5.35 cm  RV Mid diam:    4.50 cm RV S prime:     13.70 cm/s TAPSE (M-mode): 2.4 cm LEFT ATRIUM              Index        RIGHT ATRIUM           Index LA diam:        4.70 cm  2.36 cm/m   RA Area:     34.70 cm LA Vol (A2C):   119.0 ml 59.73 ml/m  RA Volume:   152.00 ml 76.30 ml/m LA Vol (A4C):   85.3 ml  42.82 ml/m LA Biplane Vol: 102.0 ml 51.20 ml/m  AORTIC VALVE                    PULMONIC VALVE AV Area (Vmax):    4.67 cm     PV Vmax:       0.91 m/s AV Area (Vmean):   4.50 cm     PV Peak grad:  3.3 mmHg AV Area (VTI):     4.39 cm AV Vmax:           142.00 cm/s AV Vmean:          85.400 cm/s AV VTI:            0.267 m AV Peak Grad:      8.1 mmHg AV Mean Grad:      4.0 mmHg LVOT Vmax:         135.00 cm/s LVOT Vmean:        78.300 cm/s LVOT VTI:          0.239 m LVOT/AV VTI ratio: 0.90 AI PHT:            603 msec  AORTA Ao Root diam: 4.10 cm Ao Asc diam:  3.20 cm MITRAL VALVE              TRICUSPID VALVE MV Area (PHT): 2.02 cm   TR Peak grad:   44.1 mmHg MV Area VTI:   3.34 cm   TR Vmax:        332.00 cm/s MV Peak grad:  3.3 mmHg MV Mean grad:  1.0 mmHg   SHUNTS MV Vmax:       0.91 m/s   Systemic VTI:  0.24 m MV Vmean:      55.5 cm/s  Systemic Diam: 2.50 cm Julien Nordmann MD Electronically signed by Julien Nordmann MD Signature Date/Time: 09/17/2022/4:38:57 PM    Final      Medications:    cefTRIAXone (ROCEPHIN)  IV 2 g (09/18/22 0918)    amLODipine  10 mg Oral q morning   calcium acetate  1,334 mg Oral TID WC   carbamazepine  1,000 mg Oral q morning   Chlorhexidine Gluconate Cloth  6 each Topical Q0600   cholecalciferol  1,000 Units Oral q AM   cloNIDine  0.2 mg  Transdermal Weekly   ferrous sulfate  325 mg Oral Daily   fluPHENAZine  2.5 mg Oral Daily   fluticasone furoate-vilanterol  1 puff Inhalation Daily   heparin  5,000 Units Subcutaneous Q8H   hydrALAZINE  10 mg Oral Q8H   isosorbide mononitrate  60 mg Oral BID   levothyroxine  50 mcg Oral Q0600   multivitamin  1 tablet Oral QHS    pantoprazole  40 mg Oral Daily   trihexyphenidyl  2 mg Oral BID   zinc sulfate  220 mg Oral Daily   acetaminophen **OR** acetaminophen, albuterol, HYDROcodone-acetaminophen, morphine injection, ondansetron **OR** ondansetron (ZOFRAN) IV, polyethylene glycol  Assessment/ Plan:  [name] is a 62 y.o.  male is a 62 year old male with past medical conditions including COPD, hypertension, DVT, schizophrenia, and end-stage renal disease on hemodialysis.  Patient presents to the emergency department with lethargy  [facility]/MWF/left aVF   Fluid volume overload with end stage renal disease on hemodialysis. CT chest abd pelvis show fluid overload with mild pulmonary edema and pleural effusions.  Receiving dialysis today, UF goal 2 L as tolerated.  Next treatment scheduled for Monday.  2. Anemia of chronic kidney disease Lab Results  Component Value Date   HGB 10.7 (L) 09/18/2022    Hemoglobin within desired target.  3. Secondary Hyperparathyroidism: with outpatient labs: PTH 459, phosphorus 3.4, calcium 9.7 on 06/08/22 .   Lab Results  Component Value Date   PTH 194 (H) 05/11/2017   CALCIUM 8.4 (L) 09/18/2022   CAION 1.19 03/27/2022   PHOS 2.5 09/18/2022   Continue cholecalciferol and calcium acetate.  4.  Hypertension with chronic kidney disease.  Home regimen includes amlodipine, clonidine, hydralazine, isosorbide, doxazosin and minoxidil.  All currently held at this time.  Blood pressure 120/75 during dialysis.   LOS: 4   4/19/20241:25 PM

## 2022-09-19 DIAGNOSIS — J9621 Acute and chronic respiratory failure with hypoxia: Secondary | ICD-10-CM | POA: Diagnosis not present

## 2022-09-19 LAB — CULTURE, BLOOD (ROUTINE X 2)

## 2022-09-19 LAB — MRSA NEXT GEN BY PCR, NASAL: MRSA by PCR Next Gen: NOT DETECTED

## 2022-09-19 NOTE — Progress Notes (Signed)
[facility]  ROUNDING NOTE   Subjective:   Todd Brown is a 62 year old male with past medical conditions including COPD, hypertension, DVT, schizophrenia, and end-stage renal disease on hemodialysis.  Patient presents to the emergency department with lethargy.   Patient is known to our practice and receives outpatient dialysis treatments at [facility] on a MWF schedule, supervised by Dr. Cherylann Ratel.   Patient seen resting quietly No lower extremity edema  Dialysis tolerated well yesterday.   Objective:  Vital signs in last 24 hours:  Temp:  [97.7 F (36.5 C)-98.7 F (37.1 C)] 97.7 F (36.5 C) (04/20 0736) Pulse Rate:  [51-71] 54 (04/20 0736) Resp:  [8-18] 17 (04/20 0736) BP: (123-158)/(74-85) 158/85 (04/20 0736) SpO2:  [96 %-100 %] 99 % (04/20 0736) Weight:  [74.6 kg] 74.6 kg (04/19 1500)  Weight change:  Filed Weights   09/18/22 0958 09/18/22 1500  Weight: 77.5 kg 74.6 kg    Intake/Output: I/O last 3 completed shifts: In: 340 [P.O.:240; IV Piggyback:100] Out: 2000 [Other:2000]   Intake/Output this shift:  Total I/O In: 120 [P.O.:120] Out: -   Physical Exam: General: NAD  Head: Normocephalic, atraumatic. Moist oral mucosal membranes  Eyes: Anicteric  Lungs:  Diminished in bases, normal effort, [place] O2  Heart: Regular rate and rhythm  Abdomen:  Soft, nontender  Extremities:  No peripheral edema.  Neurologic: Somnolent  Skin: No lesions  Access: Lt avf    Basic Metabolic Panel: Recent Labs  Lab 09/14/22 1910 09/15/22 1123 09/17/22 0448 09/18/22 1120  NA 138 133* 136 134*  K 4.5 4.7 3.9 3.9  CL 95* 94* 96* 96*  CO2 GLUCOSE 80 98 89 151*  BUN 34* 44* 29* 29*  CREATININE 6.35* 7.30* 5.16* 4.74*  CALCIUM 9.2 9.1 8.7* 8.4*  PHOS  --  3.7  --  2.5     Liver Function Tests: Recent Labs  Lab 09/14/22 1910 09/15/22 1123 09/18/22 1120  AST 31  --   --   ALT 17  --   --   ALKPHOS 102  --   --   BILITOT 1.0  --    --   PROT 7.3  --   --   ALBUMIN 3.7 2.9* 2.7*    No results for input(s): "LIPASE", "AMYLASE" in the last 168 hours. No results for input(s): "AMMONIA" in the last 168 hours.  CBC: Recent Labs  Lab 09/14/22 1910 09/15/22 0752 09/17/22 0448 09/18/22 1120  WBC 12.1* 12.9* 5.1 3.7*  NEUTROABS 10.9*  --   --   --   HGB 13.2 13.3 11.6* 10.7*  HCT 40.0 40.2 35.2* 32.4*  MCV 101.8* 101.8* 101.7* 100.9*  PLT 183 186 112* 154     Cardiac Enzymes: No results for input(s): "CKTOTAL", "CKMB", "CKMBINDEX", "TROPONINI" in the last 168 hours.  BNP: Invalid input(s): "POCBNP"  CBG: No results for input(s): "GLUCAP" in the last 168 hours.  Microbiology: Results for orders placed or performed during the hospital encounter of 09/14/22  Resp panel by RT-PCR (RSV, Flu A&B, Covid) Anterior Nasal Swab     Status: None   Collection Time: 09/14/22  7:10 PM   Specimen: Anterior Nasal Swab  Result Value Ref Range Status   SARS Coronavirus 2 by RT PCR NEGATIVE NEGATIVE Final    Comment: (NOTE) SARS-CoV-2 target nucleic acids are NOT DETECTED.  The SARS-CoV-2 RNA is generally detectable in upper respiratory specimens during the acute phase of infection. The lowest concentration  of SARS-CoV-2 viral copies this assay can detect is 138 copies/mL. A negative result does not preclude SARS-Cov-2 infection and should not be used as the sole basis for treatment or other patient management decisions. A negative result may occur with  improper specimen collection/handling, submission of specimen other than nasopharyngeal swab, presence of viral mutation(s) within the areas targeted by this assay, and inadequate number of viral copies(<138 copies/mL). A negative result must be combined with clinical observations, patient history, and epidemiological information. The expected result is Negative.  Fact Sheet for Patients:  BloggerCourse.com  Fact Sheet for Healthcare  Providers:  SeriousBroker.it  This test is no t yet approved or cleared by the [place] FDA and  has been authorized for detection and/or diagnosis of SARS-CoV-2 by FDA under an Emergency Use Authorization (EUA). This EUA will remain  in effect (meaning this test can be used) for the duration of the COVID-19 declaration under Section 564(b)(1) of the Act, 21 U.S.C.section 360bbb-3(b)(1), unless the authorization is terminated  or revoked sooner.       Influenza A by PCR NEGATIVE NEGATIVE Final   Influenza B by PCR NEGATIVE NEGATIVE Final    Comment: (NOTE) The Xpert Xpress SARS-CoV-2/FLU/RSV plus assay is intended as an aid in the diagnosis of influenza from Nasopharyngeal swab specimens and should not be used as a sole basis for treatment. Nasal washings and aspirates are unacceptable for Xpert Xpress SARS-CoV-2/FLU/RSV testing.  Fact Sheet for Patients: BloggerCourse.com  Fact Sheet for Healthcare Providers: SeriousBroker.it  This test is not yet approved or cleared by the [place] FDA and has been authorized for detection and/or diagnosis of SARS-CoV-2 by FDA under an Emergency Use Authorization (EUA). This EUA will remain in effect (meaning this test can be used) for the duration of the COVID-19 declaration under Section 564(b)(1) of the Act, 21 U.S.C. section 360bbb-3(b)(1), unless the authorization is terminated or revoked.     Resp Syncytial Virus by PCR NEGATIVE NEGATIVE Final    Comment: (NOTE) Fact Sheet for Patients: BloggerCourse.com  Fact Sheet for Healthcare Providers: SeriousBroker.it  This test is not yet approved or cleared by the [place] FDA and has been authorized for detection and/or diagnosis of SARS-CoV-2 by FDA under an Emergency Use Authorization (EUA). This EUA will remain in effect (meaning this test can be  used) for the duration of the COVID-19 declaration under Section 564(b)(1) of the Act, 21 U.S.C. section 360bbb-3(b)(1), unless the authorization is terminated or revoked.  Performed at [facility], [address] [place], [place] [address]   Blood Culture (routine x 2)     Status: Abnormal   Collection Time: 09/14/22  7:10 PM   Specimen: BLOOD  Result Value Ref Range Status   Specimen Description   Final    BLOOD BLOOD RIGHT WRIST Performed at [facility], [address] [place], [place] [address]    Special Requests   Final    BOTTLES DRAWN AEROBIC AND ANAEROBIC Blood Culture results may not be optimal due to an inadequate volume of blood received in culture bottles Performed at [facility], [address], [place], [place] [address]    Culture  Setup Time   Final    GRAM POSITIVE COCCI IN BOTH AEROBIC AND ANAEROBIC BOTTLES CRITICAL RESULT CALLED TO, READ BACK BY AND VERIFIED WITH: Todd Brown 09/15/22 0715 MW Performed at [facility], [address], [place], [place] [address]    Culture STREPTOCOCCUS GALLOLYTICUS (A)  Final  Report Status 09/17/2022 FINAL  Final   Organism ID, Bacteria STREPTOCOCCUS GALLOLYTICUS  Final      Susceptibility   Streptococcus gallolyticus - MIC*    PENICILLIN 0.12 SENSITIVE Sensitive     CEFTRIAXONE <=0.12 SENSITIVE Sensitive     ERYTHROMYCIN >=8 RESISTANT Resistant     LEVOFLOXACIN 2 SENSITIVE Sensitive     VANCOMYCIN 0.5 SENSITIVE Sensitive     * STREPTOCOCCUS GALLOLYTICUS  Blood Culture ID Panel (Reflexed)     Status: Abnormal   Collection Time: 09/14/22  7:10 PM  Result Value Ref Range Status   Enterococcus faecalis NOT DETECTED NOT DETECTED Final   Enterococcus Faecium NOT DETECTED NOT DETECTED Final   Listeria monocytogenes NOT DETECTED NOT DETECTED Final   Staphylococcus species NOT DETECTED NOT DETECTED Final   Staphylococcus aureus (BCID) NOT DETECTED NOT DETECTED Final   Staphylococcus  epidermidis NOT DETECTED NOT DETECTED Final   Staphylococcus lugdunensis NOT DETECTED NOT DETECTED Final   Streptococcus species DETECTED (A) NOT DETECTED Final    Comment: Not Enterococcus species, Streptococcus agalactiae, Streptococcus pyogenes, or Streptococcus pneumoniae. CRITICAL RESULT CALLED TO, READ BACK BY AND VERIFIED WITH: Todd Brown 09/15/22 0715 MW    Streptococcus agalactiae NOT DETECTED NOT DETECTED Final   Streptococcus pneumoniae NOT DETECTED NOT DETECTED Final   Streptococcus pyogenes NOT DETECTED NOT DETECTED Final   A.calcoaceticus-baumannii NOT DETECTED NOT DETECTED Final   Bacteroides fragilis NOT DETECTED NOT DETECTED Final   Enterobacterales NOT DETECTED NOT DETECTED Final   Enterobacter cloacae complex NOT DETECTED NOT DETECTED Final   Escherichia coli NOT DETECTED NOT DETECTED Final   Klebsiella aerogenes NOT DETECTED NOT DETECTED Final   Klebsiella oxytoca NOT DETECTED NOT DETECTED Final   Klebsiella pneumoniae NOT DETECTED NOT DETECTED Final   Proteus species NOT DETECTED NOT DETECTED Final   Salmonella species NOT DETECTED NOT DETECTED Final   Serratia marcescens NOT DETECTED NOT DETECTED Final   Haemophilus influenzae NOT DETECTED NOT DETECTED Final   Neisseria meningitidis NOT DETECTED NOT DETECTED Final   Pseudomonas aeruginosa NOT DETECTED NOT DETECTED Final   Stenotrophomonas maltophilia NOT DETECTED NOT DETECTED Final   Candida albicans NOT DETECTED NOT DETECTED Final   Candida auris NOT DETECTED NOT DETECTED Final   Candida glabrata NOT DETECTED NOT DETECTED Final   Candida krusei NOT DETECTED NOT DETECTED Final   Candida parapsilosis NOT DETECTED NOT DETECTED Final   Candida tropicalis NOT DETECTED NOT DETECTED Final   Cryptococcus neoformans/gattii NOT DETECTED NOT DETECTED Final    Comment: Performed at [facility], [address] [place], [place] [address]  Blood Culture (routine x 2)     Status: Abnormal   Collection Time:  09/14/22  7:15 PM   Specimen: BLOOD  Result Value Ref Range Status   Specimen Description   Final    BLOOD RIGHT ANTECUBITAL Performed at [facility], [address] [place], [place] [address]    Special Requests   Final    BOTTLES DRAWN AEROBIC AND ANAEROBIC Blood Culture results may not be optimal due to an inadequate volume of blood received in culture bottles Performed at [facility], [address] [place] [address] [place], [place] [address]    Culture  Setup Time   Final    GRAM POSITIVE COCCI IN BOTH AEROBIC AND ANAEROBIC BOTTLES CRITICAL RESULT CALLED TO, READ BACK BY AND VERIFIED WITH: Todd Brown 09/15/22 0715 MW CRITICAL VALUE NOTED.  VALUE IS CONSISTENT WITH PREVIOUSLY REPORTED AND CALLED VALUE.    Culture (A)  Final    STREPTOCOCCUS GALLOLYTICUS SUSCEPTIBILITIES PERFORMED ON PREVIOUS CULTURE WITHIN THE LAST 5 DAYS. Performed at [facility], [address] [place], [place] [address]    Report Status 09/17/2022 FINAL  Final  Culture, blood (Routine X 2) w Reflex to ID Panel     Status: None (Preliminary result)   Collection Time: 09/17/22  8:49 AM   Specimen: BLOOD  Result Value Ref Range Status   Specimen Description BLOOD RIGHT HAND  Final   Special Requests   Final    BOTTLES DRAWN AEROBIC ONLY Blood Culture adequate volume   Culture   Final    NO GROWTH 2 DAYS Performed at [facility], [address], [place], [place] [address]    Report Status PENDING  Incomplete  Culture, blood (Routine X 2) w Reflex to ID Panel     Status: None (Preliminary result)   Collection Time: 09/17/22  8:49 AM   Specimen: BLOOD  Result Value Ref Range Status   Specimen Description BLOOD LEFT HAND  Final   Special Requests   Final    BOTTLES DRAWN AEROBIC AND ANAEROBIC Blood Culture results may not be optimal due to an excessive volume of blood received in culture bottles   Culture   Final    NO GROWTH 2 DAYS Performed at [facility], [address], [place], [place] [address]    Report Status PENDING  Incomplete  MRSA Next Gen by PCR, Nasal     Status: None   Collection Time: 09/18/22 10:51 PM   Specimen: Nasal Mucosa; Nasal Swab  Result Value Ref Range Status   MRSA by PCR Next Gen NOT DETECTED NOT DETECTED Final    Comment: (NOTE) The GeneXpert MRSA Assay (FDA approved for NASAL specimens only), is one component of a comprehensive MRSA colonization surveillance program. It is not intended to diagnose MRSA infection nor to guide or monitor treatment for MRSA infections. Test performance is not FDA approved in patients less than 13 years old. Performed at [facility], [address] [place], [place] [address]     Coagulation Studies: No results for input(s): "LABPROT", "INR" in the last 72 hours.   Urinalysis: No results for input(s): "COLORURINE", "LABSPEC", "PHURINE", "GLUCOSEU", "HGBUR", "BILIRUBINUR", "KETONESUR", "PROTEINUR", "UROBILINOGEN", "NITRITE", "LEUKOCYTESUR" in the last 72 hours.  Invalid input(s): "APPERANCEUR"    Imaging: ECHOCARDIOGRAM COMPLETE  Result Date: 09/17/2022    ECHOCARDIOGRAM REPORT   Patient Name:   Todd Brown Date of Exam: 09/17/2022 Medical Rec #:  [id number]        Height:       71.0 in Accession #:    [id number]       Weight:       175.0 lb Date of Birth:  06-26-1960        BSA:          1.992 m Patient Age:    61 years         BP:           163/89 mmHg Patient Gender: M                HR:           64 bpm. Exam Location:  [facility] Procedure: 2D Echo, Cardiac Doppler and Color Doppler Indications:     Bacteremia  History:         Patient has prior history of Echocardiogram examinations, most  recent 06/20/2022. Previous Myocardial Infarction, COPD,                  Signs/Symptoms:Bacteremia and Altered Mental Status; Risk                  Factors:Hypertension. ESRD on Dialysis.  Sonographer:     Todd Brown Referring Phys:  [id number] Lynn Ito  Diagnosing Phys: Todd Nordmann MD  Sonographer Comments: Image acquisition challenging due to uncooperative patient. IMPRESSIONS  1. No valve vegetation noted. Leaflets well visualized.  2. Left ventricular ejection fraction, by estimation, is 50 to 55%. The left ventricle has low normal function. The left ventricle has no regional wall motion abnormalities. There is severe left ventricular hypertrophy. Left ventricular diastolic parameters are consistent with Grade I diastolic dysfunction (impaired relaxation). There is the interventricular septum is flattened in systole and diastole, consistent with right ventricular pressure and volume overload.  3. Right ventricular systolic function is moderately reduced. The right ventricular size is moderately enlarged. Moderately increased right ventricular wall thickness. There is moderately elevated pulmonary artery systolic pressure. The estimated right ventricular systolic pressure is 59.1 mmHg.  4. Left atrial size was severely dilated.  5. Right atrial size was severely dilated.  6. The mitral valve is normal in structure. No evidence of mitral valve regurgitation. No evidence of mitral stenosis.  7. Tricuspid valve regurgitation is moderate to severe.  8. The aortic valve is normal in structure. Aortic valve regurgitation is mild. Aortic valve sclerosis is present, with no evidence of aortic valve stenosis.  9. There is mild dilatation of the aortic root, measuring 41 mm. 10. The inferior vena cava is normal in size with greater than 50% respiratory variability, suggesting right atrial pressure of 3 mmHg. FINDINGS  Left Ventricle: Left ventricular ejection fraction, by estimation, is 50 to 55%. The left ventricle has low normal function. The left ventricle has no regional wall motion abnormalities. The left ventricular internal cavity size was normal in size. There is severe left ventricular hypertrophy. The interventricular septum is flattened in systole and  diastole, consistent with right ventricular pressure and volume overload. Left ventricular diastolic parameters are consistent with Grade I diastolic dysfunction (impaired relaxation). Right Ventricle: The right ventricular size is moderately enlarged. Moderately increased right ventricular wall thickness. Right ventricular systolic function is moderately reduced. There is moderately elevated pulmonary artery systolic pressure. The tricuspid regurgitant velocity is 3.32 m/s, and with an assumed right atrial pressure of 15 mmHg, the estimated right ventricular systolic pressure is 59.1 mmHg. Left Atrium: Left atrial size was severely dilated. Right Atrium: Right atrial size was severely dilated. Pericardium: Trivial pericardial effusion is present. Mitral Valve: The mitral valve is normal in structure. No evidence of mitral valve regurgitation. No evidence of mitral valve stenosis. MV peak gradient, 3.3 mmHg. The mean mitral valve gradient is 1.0 mmHg. There is no evidence of mitral valve vegetation. Tricuspid Valve: The tricuspid valve is normal in structure. Tricuspid valve regurgitation is moderate to severe. No evidence of tricuspid stenosis. There is no evidence of tricuspid valve vegetation. Aortic Valve: The aortic valve is normal in structure. Aortic valve regurgitation is mild. Aortic regurgitation PHT measures 603 msec. Aortic valve sclerosis is present, with no evidence of aortic valve stenosis. Aortic valve mean gradient measures 4.0 mmHg. Aortic valve peak gradient measures 8.1 mmHg. Aortic valve area, by VTI measures 4.39 cm. There is no evidence of aortic valve vegetation. Pulmonic Valve: The pulmonic valve was normal in structure. Pulmonic valve  regurgitation is mild to moderate. No evidence of pulmonic stenosis. There is no evidence of pulmonic valve vegetation. Aorta: The aortic root is normal in size and structure. There is mild dilatation of the aortic root, measuring 41 mm. Venous: The inferior  vena cava is normal in size with greater than 50% respiratory variability, suggesting right atrial pressure of 3 mmHg. IAS/Shunts: No atrial level shunt detected by color flow Doppler.  LEFT VENTRICLE PLAX 2D LVIDd:         4.20 cm   Diastology LVIDs:         2.90 cm   LV e' medial:  9.79 cm/s LV PW:         1.70 cm   LV e' lateral: 8.70 cm/s LV IVS:        2.40 cm LVOT diam:     2.50 cm LV SV:         117 LV SV Index:   59 LVOT Area:     4.91 cm  RIGHT VENTRICLE RV Basal diam:  5.35 cm RV Mid diam:    4.50 cm RV S prime:     13.70 cm/s TAPSE (M-mode): 2.4 cm LEFT ATRIUM              Index        RIGHT ATRIUM           Index LA diam:        4.70 cm  2.36 cm/m   RA Area:     34.70 cm LA Vol (A2C):   119.0 ml 59.73 ml/m  RA Volume:   152.00 ml 76.30 ml/m LA Vol (A4C):   85.3 ml  42.82 ml/m LA Biplane Vol: 102.0 ml 51.20 ml/m  AORTIC VALVE                    PULMONIC VALVE AV Area (Vmax):    4.67 cm     PV Vmax:       0.91 m/s AV Area (Vmean):   4.50 cm     PV Peak grad:  3.3 mmHg AV Area (VTI):     4.39 cm AV Vmax:           142.00 cm/s AV Vmean:          85.400 cm/s AV VTI:            0.267 m AV Peak Grad:      8.1 mmHg AV Mean Grad:      4.0 mmHg LVOT Vmax:         135.00 cm/s LVOT Vmean:        78.300 cm/s LVOT VTI:          0.239 m LVOT/AV VTI ratio: 0.90 AI PHT:            603 msec  AORTA Ao Root diam: 4.10 cm Ao Asc diam:  3.20 cm MITRAL VALVE              TRICUSPID VALVE MV Area (PHT): 2.02 cm   TR Peak grad:   44.1 mmHg MV Area VTI:   3.34 cm   TR Vmax:        332.00 cm/s MV Peak grad:  3.3 mmHg MV Mean grad:  1.0 mmHg   SHUNTS MV Vmax:       0.91 m/s   Systemic VTI:  0.24 m MV Vmean:      55.5 cm/s  Systemic Diam: 2.50 cm Todd Nordmann MD  Electronically signed by Todd Nordmann MD Signature Date/Time: 09/17/2022/4:38:57 PM    Final      Medications:    cefTRIAXone (ROCEPHIN)  IV 2 g (09/19/22 0835)    amLODipine  10 mg Oral q morning   calcium acetate  1,334 mg Oral TID WC   carbamazepine   1,000 mg Oral q morning   cholecalciferol  1,000 Units Oral q AM   cloNIDine  0.2 mg Transdermal Weekly   ferrous sulfate  325 mg Oral Daily   fluPHENAZine  2.5 mg Oral Daily   fluticasone furoate-vilanterol  1 puff Inhalation Daily   heparin  5,000 Units Subcutaneous Q8H   hydrALAZINE  10 mg Oral Q8H   isosorbide mononitrate  60 mg Oral BID   levothyroxine  50 mcg Oral Q0600   multivitamin  1 tablet Oral QHS   pantoprazole  40 mg Oral Daily   trihexyphenidyl  2 mg Oral BID   zinc sulfate  220 mg Oral Daily   acetaminophen **OR** acetaminophen, albuterol, HYDROcodone-acetaminophen, morphine injection, ondansetron **OR** ondansetron (ZOFRAN) IV, polyethylene glycol  Assessment/ Plan:  Mr. FAROUK VIVERO is a 62 y.o.  male is a 62 year old male with past medical conditions including COPD, hypertension, DVT, schizophrenia, and end-stage renal disease on hemodialysis.  Patient presents to the emergency department with lethargy  [facility]/MWF/left aVF   Fluid volume overload with end stage renal disease on hemodialysis. CT chest abd pelvis show fluid overload with mild pulmonary edema and pleural effusions.    Dialysis received yesterday, UF 2L achieved. Next treatment scheduled for Monday.   2. Anemia of chronic kidney disease Lab Results  Component Value Date   HGB 10.7 (L) 09/18/2022    Hemoglobin at goal.  No need for ESA's at this time.  3. Secondary Hyperparathyroidism: with outpatient labs: PTH 459, phosphorus 3.4, calcium 9.7 on 06/08/22 .   Lab Results  Component Value Date   PTH 194 (H) 05/11/2017   CALCIUM 8.4 (L) 09/18/2022   CAION 1.19 03/27/2022   PHOS 2.5 09/18/2022  Calcium and phosphorus within desired range. Continue cholecalciferol and calcium acetate.  4.  Hypertension with chronic kidney disease.  Home regimen includes amlodipine, clonidine, hydralazine, isosorbide, doxazosin and minoxidil.  All currently held at this time.  Blood pressure  acceptable for this patient, 158/85.   LOS: 5   4/20/202412:37 PM

## 2022-09-19 NOTE — Progress Notes (Signed)
PROGRESS NOTE    Todd Brown   RUE:[id number] DOB: 05-10-1961  DOA: 09/14/2022 Date of Service: 09/19/22 PCP: Smiley Houseman, NP     Brief Narrative / Hospital Course:  Todd Brown is a 62 y.o. 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  Patient was in his usual state of health earlier and went to and completed his dialysis session but on arrival home he was lethargic.  On arrival of EMS he was saturating in the 70s on room air and was placed on NRB for transport. Of note: bacteremia secondary to strep gallolyticus in 01/24. Suspected source was from the PermCath (PermCath was removed during that hospitalization). Standard ECHO was obtained which did not show any vegetation.  ID was consulted and they requested TEE and colonscopy, however, the pt did not agree to have these procedures. Pt was continued on IV cefazolin 2g MWF with dialysis for strep gallolyticus bacteremia (END DATE Jul 20 2022).   04/15: ED course and data review: Tmax 104.2 with pulse 107 respirations 23 and O2 sat initially 89% on NRB improving to 94% on HFNC at 6 L.  BP was 154/86 on arrival.  VBG on HFNC showed pH 7.5 with pCO2 40.  Labs otherwise significant for WBC 12,100, lactic acid 0.9 and procalcitonin 0.72.  Respiratory viral panel negative for COVID flu and RSV.  Troponin 81. Patient started on cefepime vancomycin and Flagyl for sepsis of unknown source. Hospitalist consulted for admission.  04/16: Blood culture revealed strep species. Discontinued cefepime, vancomycin and Flagyl and place patient on Rocephin 2 g IV daily. Consulted ID - recs for TEE and  colonoscopy, AV graft evauation. Patient did not receive IV fluid resuscitation due to fluid overload. Removed 2L in HD 04/17: dialysis today - examined afterward. Pt has no complaints other than wants something to eat. Removed another 2L in HD.  04/18: cardiology consulted for TEE - await improvement in volume status, TEE will have to be w/ [facility] not [facility] d/t staffing. Repeating BCx. Will d/w guardian re: consent for TEE - she states he was amenable to TEE but did not want colonoscopy.  04/19-04/20: stable, planning TEE Monday 04/22  Consultants:  Nephrology  Infectious Disease  Cardiology   Procedures: None       ASSESSMENT & PLAN:   Principal Problem:   Acute on chronic respiratory failure with hypoxia Active Problems:   Severe sepsis versus SIRS   CAP (community acquired pneumonia)   Anasarca (mild pulmonary edema, pleural and pericardial effusion, ascites on CT)   Acute metabolic encephalopathy   ESRD on dialysis   Essential hypertension   COPD (chronic obstructive pulmonary disease)   Schizophrenia   Essential hypertension, benign   Bacteremia   Acute on chronic respiratory failure  Severe sepsis  Community-acquired pneumonia  Streptococcus bacteremia.   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  Organ dysfunction of respiratory failure and  altered mental status. CT chest abdomen and pelvis consistent with anasarca.  Cannot rule out superimposed pulmonary infection Blood culture revealed strep species Continue Rocephin 2 g IV daily Consult ID --> will need TEE  and colonoscopy, evaluate graft  Patient did not receive IV fluid resuscitation due to fluid overload, conitnue w/ HD Per his guardian, he was amenable to TEE last time but not to colonoscopy - he seems amenable now and guardian plan on signing consent  Goals of care Patient does not  demonstrate adequate understanding of his medical illness(es), he can not make an informed decision about complex treatments - consent or refusal.  Given frailty and multiple comorbidities, he is less likely to survive resuscitation measures in the event of a cardiopulmonary arrest but I am not convinced at this time that resuscitation would be utterly futile, either. He has no terminal illness at this time which would recommend strongly for DNR/DNI status, especially since he has not voiced that he would NOT desire CPR or life support measures  guardian will have to give consent/refusal for TEE or other procedures  Consider palliative care consult pending clinical course   Acute on chronic respiratory failure with hypoxia - improving Etiology acute on chronic diastolic CHF and CAP  Continue O2 to maintain sats over 92% and wean as tolerated  Acute metabolic encephalopathy - resolved Secondary to sepsis from Streptococcus bacteremia Patient is oriented to person and place but not to time Monitor patient closely    Acute on chronic diastolic dysfunction CHF Anasarca  EF 06/2022 showing EF 60 to 65% with grade 1 diastolic dysfunction CT with findings of volume overload, mild pulmonary edema with pleural effusions, small volume pericardial effusion and anasarca and small volume fluid ascites. Net IO Since Admission: -1,570 mL [09/19/22 1259] Nephrology consult for management of fluid status   ESRD on dialysis Nephrology consult for renal replacement therapy   Essential hypertension continue home meds    COPD (chronic obstructive pulmonary disease) Continue home inhalers DuoNebs as needed   Schizophrenia Continue Prolixin and carbamazepine  Vision impairment Precautions for fall   DVT prophylaxis: heparin Pertinent IV fluids/nutrition: no continuous IV fluids, cardiac diet  Central lines / invasive devices: none  Code Status: FULL CODE ACP documentation reviewed: none on file    Current Admission Status: inpatient  TOC needs / Dispo plan: HH PT Barriers to discharge / significant pending items: bacteremia, pend repeat cultures, ID recs, fluid overload, TEE, IV abx, will be here several more days pending results     Lenn Sink - guardian [phone] = fax            Subjective / Brief ROS:  Patient reports no concerns  Denies CP/SOB.  Pain controlled.  Discussed TEE, he says "ok" but is not demonstrating complete comprehension, about same as yesterday    Family Communication: called legal guardian to give update yesterday     Objective Findings:  Vitals:   09/18/22 1902 09/18/22 2325 09/19/22 0320 09/19/22 0736  BP: 133/84  (!) 141/84 (!) 158/85  Pulse: (!) 55  (!) 58 (!) 54  Resp: 14  18 17   Temp: 98.7 F (37.1 C)  98.3 F (36.8 C) 97.7 F (36.5 C)  TempSrc:      SpO2: 100% 96% 98% 99%  Weight:      Height:        Intake/Output Summary (Last 24 hours) at 09/19/2022 1259 Last data filed at 09/19/2022 1026 Gross per 24 hour  Intake 340 ml  Output 2000 ml  Net -1660 ml   Filed Weights   09/18/22 0958 09/18/22 1500  Weight: 77.5 kg 74.6 kg    Examination:  Physical Exam Constitutional:  General: He is not in acute distress. Cardiovascular:     Rate and Rhythm: Normal rate and regular rhythm.  Pulmonary:     Effort: Pulmonary effort is normal.     Breath sounds: Normal breath sounds.  Abdominal:     General: Abdomen is flat.     Palpations: Abdomen is soft.  Musculoskeletal:     Right lower leg: No edema.     Left lower leg: No edema.  Skin:    General: Skin is warm and dry.  Neurological:     General: No focal deficit present.     Mental Status: He is alert. Mental status is at baseline.  Psychiatric:        Behavior: Behavior normal.          Scheduled Medications:   amLODipine  10 mg Oral q morning   calcium acetate  1,334 mg Oral TID WC   carbamazepine  1,000 mg Oral q morning    cholecalciferol  1,000 Units Oral q AM   cloNIDine  0.2 mg Transdermal Weekly   ferrous sulfate  325 mg Oral Daily   fluPHENAZine  2.5 mg Oral Daily   fluticasone furoate-vilanterol  1 puff Inhalation Daily   heparin  5,000 Units Subcutaneous Q8H   hydrALAZINE  10 mg Oral Q8H   isosorbide mononitrate  60 mg Oral BID   levothyroxine  50 mcg Oral Q0600   multivitamin  1 tablet Oral QHS   pantoprazole  40 mg Oral Daily   trihexyphenidyl  2 mg Oral BID   zinc sulfate  220 mg Oral Daily    Continuous Infusions:  cefTRIAXone (ROCEPHIN)  IV 2 g (09/19/22 0835)    PRN Medications:  acetaminophen **OR** acetaminophen, albuterol, HYDROcodone-acetaminophen, morphine injection, ondansetron **OR** ondansetron (ZOFRAN) IV, polyethylene glycol  Antimicrobials from admission:  Anti-infectives (From admission, onward)    Start     Dose/Rate Route Frequency Ordered Stop   09/16/22 2200  vancomycin (VANCOREADY) IVPB 750 mg/150 mL  Status:  Discontinued        750 mg 150 mL/hr over 60 Minutes Intravenous Every M-W-F (Hemodialysis) 09/15/22 0031 09/15/22 1329   09/15/22 1000  ceFEPIme (MAXIPIME) 2 g in sodium chloride 0.9 % 100 mL IVPB  Status:  Discontinued        2 g 200 mL/hr over 30 Minutes Intravenous Every 12 hours 09/15/22 0031 09/15/22 0734   09/15/22 0800  metroNIDAZOLE (FLAGYL) IVPB 500 mg  Status:  Discontinued        500 mg 100 mL/hr over 60 Minutes Intravenous Every 12 hours 09/14/22 2248 09/15/22 1329   09/15/22 0800  cefTRIAXone (ROCEPHIN) 2 g in sodium chloride 0.9 % 100 mL IVPB        2 g 200 mL/hr over 30 Minutes Intravenous Every 24 hours 09/15/22 0734     09/15/22 0000  vancomycin (VANCOREADY) IVPB 750 mg/150 mL        750 mg 150 mL/hr over 60 Minutes Intravenous  Once 09/14/22 2258 09/15/22 0200   09/14/22 1915  ceFEPIme (MAXIPIME) 2 g in sodium chloride 0.9 % 100 mL IVPB        2 g 200 mL/hr over 30 Minutes Intravenous  Once 09/14/22 1909 09/14/22 1949   09/14/22 1915   metroNIDAZOLE (FLAGYL) IVPB 500 mg        500 mg 100 mL/hr over 60 Minutes Intravenous  Once 09/14/22 1909 09/14/22 2052   09/14/22 1915  vancomycin (VANCOCIN) IVPB 1000 mg/200 mL premix  1,000 mg 200 mL/hr over 60 Minutes Intravenous  Once 09/14/22 1909 09/14/22 2210           Data Reviewed:  I have personally reviewed the following...  CBC: Recent Labs  Lab 09/14/22 1910 09/15/22 0752 09/17/22 0448 09/18/22 1120  WBC 12.1* 12.9* 5.1 3.7*  NEUTROABS 10.9*  --   --   --   HGB 13.2 13.3 11.6* 10.7*  HCT 40.0 40.2 35.2* 32.4*  MCV 101.8* 101.8* 101.7* 100.9*  PLT 183 186 112* 154   Basic Metabolic Panel: Recent Labs  Lab 09/14/22 1910 09/15/22 1123 09/17/22 0448 09/18/22 1120  NA 138 133* 136 134*  K 4.5 4.7 3.9 3.9  CL 95* 94* 96* 96*  CO2 GLUCOSE 80 98 89 151*  BUN 34* 44* 29* 29*  CREATININE 6.35* 7.30* 5.16* 4.74*  CALCIUM 9.2 9.1 8.7* 8.4*  PHOS  --  3.7  --  2.5   GFR: Estimated Creatinine Clearance: 17.3 mL/min (A) (by C-G formula based on SCr of 4.74 mg/dL (H)). Liver Function Tests: Recent Labs  Lab 09/14/22 1910 09/15/22 1123 09/18/22 1120  AST 31  --   --   ALT 17  --   --   ALKPHOS 102  --   --   BILITOT 1.0  --   --   PROT 7.3  --   --   ALBUMIN 3.7 2.9* 2.7*   No results for input(s): "LIPASE", "AMYLASE" in the last 168 hours. No results for input(s): "AMMONIA" in the last 168 hours. Coagulation Profile: Recent Labs  Lab 09/14/22 1910  INR 1.1   Cardiac Enzymes: No results for input(s): "CKTOTAL", "CKMB", "CKMBINDEX", "TROPONINI" in the last 168 hours. BNP (last 3 results) No results for input(s): "PROBNP" in the last 8760 hours. HbA1C: No results for input(s): "HGBA1C" in the last 72 hours. CBG: No results for input(s): "GLUCAP" in the last 168 hours. Lipid Profile: No results for input(s): "CHOL", "HDL", "LDLCALC", "TRIG", "CHOLHDL", "LDLDIRECT" in the last 72 hours. Thyroid Function Tests: No results  for input(s): "TSH", "T4TOTAL", "FREET4", "T3FREE", "THYROIDAB" in the last 72 hours. Anemia Panel: No results for input(s): "VITAMINB12", "FOLATE", "FERRITIN", "TIBC", "IRON", "RETICCTPCT" in the last 72 hours. Most Recent Urinalysis On File:     Component Value Date/Time   COLORURINE YELLOW (A) 05/17/2015 0012   APPEARANCEUR HAZY (A) 05/17/2015 0012   LABSPEC 1.009 05/17/2015 0012   PHURINE 7.0 05/17/2015 0012   GLUCOSEU NEGATIVE 05/17/2015 0012   HGBUR 1+ (A) 05/17/2015 0012   BILIRUBINUR NEGATIVE 05/17/2015 0012   KETONESUR NEGATIVE 05/17/2015 0012   PROTEINUR 30 (A) 05/17/2015 0012   NITRITE NEGATIVE 05/17/2015 0012   LEUKOCYTESUR 3+ (A) 05/17/2015 0012   Sepsis Labs: (procalcitonin:4,lacticidven:4) Microbiology: Recent Results (from the past 240 hour(s))  Resp panel by RT-PCR (RSV, Flu A&B, Covid) Anterior Nasal Swab     Status: None   Collection Time: 09/14/22  7:10 PM   Specimen: Anterior Nasal Swab  Result Value Ref Range Status   SARS Coronavirus 2 by RT PCR NEGATIVE NEGATIVE Final    Comment: (NOTE) SARS-CoV-2 target nucleic acids are NOT DETECTED.  The SARS-CoV-2 RNA is generally detectable in upper respiratory specimens during the acute phase of infection. The lowest concentration of SARS-CoV-2 viral copies this assay can detect is 138 copies/mL. A negative result does not preclude SARS-Cov-2 infection and should not be used as the sole basis for treatment or other patient management decisions. A negative result  may occur with  improper specimen collection/handling, submission of specimen other than nasopharyngeal swab, presence of viral mutation(s) within the areas targeted by this assay, and inadequate number of viral copies(<138 copies/mL). A negative result must be combined with clinical observations, patient history, and epidemiological information. The expected result is Negative.  Fact Sheet for Patients:   BloggerCourse.com  Fact Sheet for Healthcare Providers:  SeriousBroker.it  This test is no t yet approved or cleared by the [place] FDA and  has been authorized for detection and/or diagnosis of SARS-CoV-2 by FDA under an Emergency Use Authorization (EUA). This EUA will remain  in effect (meaning this test can be used) for the duration of the COVID-19 declaration under Section 564(b)(1) of the Act, 21 U.S.C.section 360bbb-3(b)(1), unless the authorization is terminated  or revoked sooner.       Influenza A by PCR NEGATIVE NEGATIVE Final   Influenza B by PCR NEGATIVE NEGATIVE Final    Comment: (NOTE) The Xpert Xpress SARS-CoV-2/FLU/RSV plus assay is intended as an aid in the diagnosis of influenza from Nasopharyngeal swab specimens and should not be used as a sole basis for treatment. Nasal washings and aspirates are unacceptable for Xpert Xpress SARS-CoV-2/FLU/RSV testing.  Fact Sheet for Patients: BloggerCourse.com  Fact Sheet for Healthcare Providers: SeriousBroker.it  This test is not yet approved or cleared by the [place] FDA and has been authorized for detection and/or diagnosis of SARS-CoV-2 by FDA under an Emergency Use Authorization (EUA). This EUA will remain in effect (meaning this test can be used) for the duration of the COVID-19 declaration under Section 564(b)(1) of the Act, 21 U.S.C. section 360bbb-3(b)(1), unless the authorization is terminated or revoked.     Resp Syncytial Virus by PCR NEGATIVE NEGATIVE Final    Comment: (NOTE) Fact Sheet for Patients: BloggerCourse.com  Fact Sheet for Healthcare Providers: SeriousBroker.it  This test is not yet approved or cleared by the [place] FDA and has been authorized for detection and/or diagnosis of SARS-CoV-2 by FDA under an Emergency Use  Authorization (EUA). This EUA will remain in effect (meaning this test can be used) for the duration of the COVID-19 declaration under Section 564(b)(1) of the Act, 21 U.S.C. section 360bbb-3(b)(1), unless the authorization is terminated or revoked.  Performed at [facility], [address], [place], [place] [address]   Blood Culture (routine x 2)     Status: Abnormal   Collection Time: 09/14/22  7:10 PM   Specimen: BLOOD  Result Value Ref Range Status   Specimen Description   Final    BLOOD BLOOD RIGHT WRIST Performed at [facility], [address], [place], [place] [address]    Special Requests   Final    BOTTLES DRAWN AEROBIC AND ANAEROBIC Blood Culture results may not be optimal due to an inadequate volume of blood received in culture bottles Performed at [facility], [address] [place], [place] [address]    Culture  Setup Time   Final    GRAM POSITIVE COCCI IN BOTH AEROBIC AND ANAEROBIC BOTTLES CRITICAL RESULT CALLED TO, READ BACK BY AND VERIFIED WITH: Albertine Grates 09/15/22 0715 MW Performed at [facility], [address] [place], [place] [address]    Culture STREPTOCOCCUS GALLOLYTICUS (A)  Final   Report Status 09/17/2022 FINAL  Final   Organism ID, Bacteria STREPTOCOCCUS GALLOLYTICUS  Final      Susceptibility   Streptococcus gallolyticus - MIC*    PENICILLIN 0.12 SENSITIVE Sensitive  CEFTRIAXONE <=0.12 SENSITIVE Sensitive     ERYTHROMYCIN >=8 RESISTANT Resistant     LEVOFLOXACIN 2 SENSITIVE Sensitive     VANCOMYCIN 0.5 SENSITIVE Sensitive     * STREPTOCOCCUS GALLOLYTICUS  Blood Culture ID Panel (Reflexed)     Status: Abnormal   Collection Time: 09/14/22  7:10 PM  Result Value Ref Range Status   Enterococcus faecalis NOT DETECTED NOT DETECTED Final   Enterococcus Faecium NOT DETECTED NOT DETECTED Final   Listeria monocytogenes NOT DETECTED NOT DETECTED Final   Staphylococcus species NOT DETECTED NOT DETECTED Final    Staphylococcus aureus (BCID) NOT DETECTED NOT DETECTED Final   Staphylococcus epidermidis NOT DETECTED NOT DETECTED Final   Staphylococcus lugdunensis NOT DETECTED NOT DETECTED Final   Streptococcus species DETECTED (A) NOT DETECTED Final    Comment: Not Enterococcus species, Streptococcus agalactiae, Streptococcus pyogenes, or Streptococcus pneumoniae. CRITICAL RESULT CALLED TO, READ BACK BY AND VERIFIED WITH: TRAY GREENWOOD 09/15/22 0715 MW    Streptococcus agalactiae NOT DETECTED NOT DETECTED Final   Streptococcus pneumoniae NOT DETECTED NOT DETECTED Final   Streptococcus pyogenes NOT DETECTED NOT DETECTED Final   A.calcoaceticus-baumannii NOT DETECTED NOT DETECTED Final   Bacteroides fragilis NOT DETECTED NOT DETECTED Final   Enterobacterales NOT DETECTED NOT DETECTED Final   Enterobacter cloacae complex NOT DETECTED NOT DETECTED Final   Escherichia coli NOT DETECTED NOT DETECTED Final   Klebsiella aerogenes NOT DETECTED NOT DETECTED Final   Klebsiella oxytoca NOT DETECTED NOT DETECTED Final   Klebsiella pneumoniae NOT DETECTED NOT DETECTED Final   Proteus species NOT DETECTED NOT DETECTED Final   Salmonella species NOT DETECTED NOT DETECTED Final   Serratia marcescens NOT DETECTED NOT DETECTED Final   Haemophilus influenzae NOT DETECTED NOT DETECTED Final   Neisseria meningitidis NOT DETECTED NOT DETECTED Final   Pseudomonas aeruginosa NOT DETECTED NOT DETECTED Final   Stenotrophomonas maltophilia NOT DETECTED NOT DETECTED Final   Candida albicans NOT DETECTED NOT DETECTED Final   Candida auris NOT DETECTED NOT DETECTED Final   Candida glabrata NOT DETECTED NOT DETECTED Final   Candida krusei NOT DETECTED NOT DETECTED Final   Candida parapsilosis NOT DETECTED NOT DETECTED Final   Candida tropicalis NOT DETECTED NOT DETECTED Final   Cryptococcus neoformans/gattii NOT DETECTED NOT DETECTED Final    Comment: Performed at [facility], [address] [place], [place]  [address]  Blood Culture (routine x 2)     Status: Abnormal   Collection Time: 09/14/22  7:15 PM   Specimen: BLOOD  Result Value Ref Range Status   Specimen Description   Final    BLOOD RIGHT ANTECUBITAL Performed at [facility], [address] [place], [place] [address]    Special Requests   Final    BOTTLES DRAWN AEROBIC AND ANAEROBIC Blood Culture results may not be optimal due to an inadequate volume of blood received in culture bottles Performed at [facility], [address] [place], [place] [address]    Culture  Setup Time   Final    GRAM POSITIVE COCCI IN BOTH AEROBIC AND ANAEROBIC BOTTLES CRITICAL RESULT CALLED TO, READ BACK BY AND VERIFIED WITH: TRAY GREENWOOD 09/15/22 0715 MW CRITICAL VALUE NOTED.  VALUE IS CONSISTENT WITH PREVIOUSLY REPORTED AND CALLED VALUE.    Culture (A)  Final    STREPTOCOCCUS GALLOLYTICUS SUSCEPTIBILITIES PERFORMED ON PREVIOUS CULTURE WITHIN THE LAST 5 DAYS. Performed at [facility], [address], [place], [place] [address]    Report Status 09/17/2022 FINAL  Final  Culture, blood (Routine X 2) w Reflex to ID Panel     Status: None (Preliminary result)   Collection Time: 09/17/22  8:49 AM   Specimen: BLOOD  Result Value Ref Range Status   Specimen Description BLOOD RIGHT HAND  Final   Special Requests   Final    BOTTLES DRAWN AEROBIC ONLY Blood Culture adequate volume   Culture   Final    NO GROWTH 2 DAYS Performed at [facility], [address] [place], [place] [address]    Report Status PENDING  Incomplete  Culture, blood (Routine X 2) w Reflex to ID Panel     Status: None (Preliminary result)   Collection Time: 09/17/22  8:49 AM   Specimen: BLOOD  Result Value Ref Range Status   Specimen Description BLOOD LEFT HAND  Final   Special Requests   Final    BOTTLES DRAWN AEROBIC AND ANAEROBIC Blood Culture results may not be optimal due to an excessive volume of blood received in culture bottles    Culture   Final    NO GROWTH 2 DAYS Performed at [facility], [address], [place], [place] [address]    Report Status PENDING  Incomplete  MRSA Next Gen by PCR, Nasal     Status: None   Collection Time: 09/18/22 10:51 PM   Specimen: Nasal Mucosa; Nasal Swab  Result Value Ref Range Status   MRSA by PCR Next Gen NOT DETECTED NOT DETECTED Final    Comment: (NOTE) The GeneXpert MRSA Assay (FDA approved for NASAL specimens only), is one component of a comprehensive MRSA colonization surveillance program. It is not intended to diagnose MRSA infection nor to guide or monitor treatment for MRSA infections. Test performance is not FDA approved in patients less than 30 years old. Performed at [facility], [address], [place], [place] [address]       Radiology Studies last 3 days: ECHOCARDIOGRAM COMPLETE  Result Date: 09/17/2022    ECHOCARDIOGRAM REPORT   Patient Name:   MCKINNLEY SMITHEY Date of Exam: 09/17/2022 Medical Rec #:  [id number]        Height:       71.0 in Accession #:    [id number]       Weight:       175.0 lb Date of Birth:  1960-11-28        BSA:          1.992 m Patient Age:    61 years         BP:           163/89 mmHg Patient Gender: M                HR:           64 bpm. Exam Location:  [facility] Procedure: 2D Echo, Cardiac Doppler and Color Doppler Indications:     Bacteremia  History:         Patient has prior history of Echocardiogram examinations, most                  recent 06/20/2022. Previous Myocardial Infarction, COPD,                  Signs/Symptoms:Bacteremia and Altered Mental Status; Risk                  Factors:Hypertension. ESRD on Dialysis.  Sonographer:     Mikki Harbor Referring Phys:  [id number] Lynn Ito  Diagnosing Phys: Julien Nordmann MD  Sonographer Comments: Image acquisition challenging due to uncooperative patient. IMPRESSIONS  1. No valve vegetation noted. Leaflets well visualized.  2. Left ventricular ejection fraction,  by estimation, is 50 to 55%. The left ventricle has low normal function. The left ventricle has no regional wall motion abnormalities. There is severe left ventricular hypertrophy. Left ventricular diastolic parameters are consistent with Grade I diastolic dysfunction (impaired relaxation). There is the interventricular septum is flattened in systole and diastole, consistent with right ventricular pressure and volume overload.  3. Right ventricular systolic function is moderately reduced. The right ventricular size is moderately enlarged. Moderately increased right ventricular wall thickness. There is moderately elevated pulmonary artery systolic pressure. The estimated right ventricular systolic pressure is 59.1 mmHg.  4. Left atrial size was severely dilated.  5. Right atrial size was severely dilated.  6. The mitral valve is normal in structure. No evidence of mitral valve regurgitation. No evidence of mitral stenosis.  7. Tricuspid valve regurgitation is moderate to severe.  8. The aortic valve is normal in structure. Aortic valve regurgitation is mild. Aortic valve sclerosis is present, with no evidence of aortic valve stenosis.  9. There is mild dilatation of the aortic root, measuring 41 mm. 10. The inferior vena cava is normal in size with greater than 50% respiratory variability, suggesting right atrial pressure of 3 mmHg. FINDINGS  Left Ventricle: Left ventricular ejection fraction, by estimation, is 50 to 55%. The left ventricle has low normal function. The left ventricle has no regional wall motion abnormalities. The left ventricular internal cavity size was normal in size. There is severe left ventricular hypertrophy. The interventricular septum is flattened in systole and diastole, consistent with right ventricular pressure and volume overload. Left ventricular diastolic parameters are consistent with Grade I diastolic dysfunction (impaired relaxation). Right Ventricle: The right ventricular size is  moderately enlarged. Moderately increased right ventricular wall thickness. Right ventricular systolic function is moderately reduced. There is moderately elevated pulmonary artery systolic pressure. The tricuspid regurgitant velocity is 3.32 m/s, and with an assumed right atrial pressure of 15 mmHg, the estimated right ventricular systolic pressure is 59.1 mmHg. Left Atrium: Left atrial size was severely dilated. Right Atrium: Right atrial size was severely dilated. Pericardium: Trivial pericardial effusion is present. Mitral Valve: The mitral valve is normal in structure. No evidence of mitral valve regurgitation. No evidence of mitral valve stenosis. MV peak gradient, 3.3 mmHg. The mean mitral valve gradient is 1.0 mmHg. There is no evidence of mitral valve vegetation. Tricuspid Valve: The tricuspid valve is normal in structure. Tricuspid valve regurgitation is moderate to severe. No evidence of tricuspid stenosis. There is no evidence of tricuspid valve vegetation. Aortic Valve: The aortic valve is normal in structure. Aortic valve regurgitation is mild. Aortic regurgitation PHT measures 603 msec. Aortic valve sclerosis is present, with no evidence of aortic valve stenosis. Aortic valve mean gradient measures 4.0 mmHg. Aortic valve peak gradient measures 8.1 mmHg. Aortic valve area, by VTI measures 4.39 cm. There is no evidence of aortic valve vegetation. Pulmonic Valve: The pulmonic valve was normal in structure. Pulmonic valve regurgitation is mild to moderate. No evidence of pulmonic stenosis. There is no evidence of pulmonic valve vegetation. Aorta: The aortic root is normal in size and structure. There is mild dilatation of the aortic root, measuring 41 mm. Venous: The inferior vena cava is normal in size with greater than 50% respiratory variability, suggesting right atrial pressure of 3 mmHg. IAS/Shunts: No atrial  level shunt detected by color flow Doppler.  LEFT VENTRICLE PLAX 2D LVIDd:         4.20 cm    Diastology LVIDs:         2.90 cm   LV e' medial:  9.79 cm/s LV PW:         1.70 cm   LV e' lateral: 8.70 cm/s LV IVS:        2.40 cm LVOT diam:     2.50 cm LV SV:         117 LV SV Index:   59 LVOT Area:     4.91 cm  RIGHT VENTRICLE RV Basal diam:  5.35 cm RV Mid diam:    4.50 cm RV S prime:     13.70 cm/s TAPSE (M-mode): 2.4 cm LEFT ATRIUM              Index        RIGHT ATRIUM           Index LA diam:        4.70 cm  2.36 cm/m   RA Area:     34.70 cm LA Vol (A2C):   119.0 ml 59.73 ml/m  RA Volume:   152.00 ml 76.30 ml/m LA Vol (A4C):   85.3 ml  42.82 ml/m LA Biplane Vol: 102.0 ml 51.20 ml/m  AORTIC VALVE                    PULMONIC VALVE AV Area (Vmax):    4.67 cm     PV Vmax:       0.91 m/s AV Area (Vmean):   4.50 cm     PV Peak grad:  3.3 mmHg AV Area (VTI):     4.39 cm AV Vmax:           142.00 cm/s AV Vmean:          85.400 cm/s AV VTI:            0.267 m AV Peak Grad:      8.1 mmHg AV Mean Grad:      4.0 mmHg LVOT Vmax:         135.00 cm/s LVOT Vmean:        78.300 cm/s LVOT VTI:          0.239 m LVOT/AV VTI ratio: 0.90 AI PHT:            603 msec  AORTA Ao Root diam: 4.10 cm Ao Asc diam:  3.20 cm MITRAL VALVE              TRICUSPID VALVE MV Area (PHT): 2.02 cm   TR Peak grad:   44.1 mmHg MV Area VTI:   3.34 cm   TR Vmax:        332.00 cm/s MV Peak grad:  3.3 mmHg MV Mean grad:  1.0 mmHg   SHUNTS MV Vmax:       0.91 m/s   Systemic VTI:  0.24 m MV Vmean:      55.5 cm/s  Systemic Diam: 2.50 cm Julien Nordmann MD Electronically signed by Julien Nordmann MD Signature Date/Time: 09/17/2022/4:38:57 PM    Final              LOS: 5 days        Sunnie Nielsen, DO [facility] 09/19/2022, 12:59 PM    Dictation software may have been used to generate the above note. Typos may occur and escape  review in typed/dictated notes. Please contact Dr Lyn Hollingshead directly for clarity if needed.  Staff may message me via secure chat in Epic  but this may not receive an immediate  response,  please page me for urgent matters!  If 7PM-7AM, please contact night coverage [email or web address]

## 2022-09-20 DIAGNOSIS — B955 Unspecified streptococcus as the cause of diseases classified elsewhere: Secondary | ICD-10-CM | POA: Diagnosis not present

## 2022-09-20 DIAGNOSIS — G9341 Metabolic encephalopathy: Secondary | ICD-10-CM | POA: Diagnosis not present

## 2022-09-20 DIAGNOSIS — N186 End stage renal disease: Secondary | ICD-10-CM | POA: Diagnosis not present

## 2022-09-20 DIAGNOSIS — R7881 Bacteremia: Secondary | ICD-10-CM | POA: Diagnosis not present

## 2022-09-20 LAB — CULTURE, BLOOD (ROUTINE X 2): Culture: NO GROWTH

## 2022-09-20 MED ORDER — VITAMIN D 25 MCG (1000 UNIT) PO TABS
1000.0000 [IU] | ORAL_TABLET | Freq: Every day | ORAL | Status: DC
  Administered 2022-09-20 – 2022-09-24 (×5): 1000 [IU] via ORAL
  Filled 2022-09-20 (×5): qty 1

## 2022-09-20 NOTE — Progress Notes (Signed)
[facility]  ROUNDING NOTE   Subjective:   Todd Brown is a 62 year old male with past medical conditions including COPD, hypertension, DVT, schizophrenia, and end-stage renal disease on hemodialysis.  Patient presents to the emergency department with lethargy.   Patient is known to our practice and receives outpatient dialysis treatments at [facility] on a MWF schedule, supervised by Dr. Cherylann Ratel.   Resting comfortably in bed O2 weaned to 2L  Objective:  Vital signs in last 24 hours:  Temp:  [97.7 F (36.5 C)-98.6 F (37 C)] 97.7 F (36.5 C) (04/21 0710) Pulse Rate:  [56-63] 56 (04/21 0721) Resp:  [14-16] 14 (04/21 0710) BP: (128-179)/(80-90) 157/84 (04/21 0721) SpO2:  [98 %-100 %] 99 % (04/21 0710)  Weight change:  Filed Weights   09/18/22 0958 09/18/22 1500  Weight: 77.5 kg 74.6 kg    Intake/Output: I/O last 3 completed shifts: In: 1080 [P.O.:780; Other:100; IV Piggyback:200] Out: 0    Intake/Output this shift:  No intake/output data recorded.  Physical Exam: General: NAD  Head: Normocephalic, atraumatic. Moist oral mucosal membranes  Eyes: Anicteric  Lungs:  Diminished in bases, normal effort, [place] O2  Heart: Regular rate and rhythm  Abdomen:  Soft, nontender  Extremities:  No peripheral edema.  Neurologic: Somnolent  Skin: No lesions  Access: Lt avf    Basic Metabolic Panel: Recent Labs  Lab 09/14/22 1910 09/15/22 1123 09/17/22 0448 09/18/22 1120  NA 138 133* 136 134*  K 4.5 4.7 3.9 3.9  CL 95* 94* 96* 96*  CO2 GLUCOSE 80 98 89 151*  BUN 34* 44* 29* 29*  CREATININE 6.35* 7.30* 5.16* 4.74*  CALCIUM 9.2 9.1 8.7* 8.4*  PHOS  --  3.7  --  2.5     Liver Function Tests: Recent Labs  Lab 09/14/22 1910 09/15/22 1123 09/18/22 1120  AST 31  --   --   ALT 17  --   --   ALKPHOS 102  --   --   BILITOT 1.0  --   --   PROT 7.3  --   --   ALBUMIN 3.7 2.9* 2.7*    No results for input(s): "LIPASE", "AMYLASE" in  the last 168 hours. No results for input(s): "AMMONIA" in the last 168 hours.  CBC: Recent Labs  Lab 09/14/22 1910 09/15/22 0752 09/17/22 0448 09/18/22 1120  WBC 12.1* 12.9* 5.1 3.7*  NEUTROABS 10.9*  --   --   --   HGB 13.2 13.3 11.6* 10.7*  HCT 40.0 40.2 35.2* 32.4*  MCV 101.8* 101.8* 101.7* 100.9*  PLT 183 186 112* 154     Cardiac Enzymes: No results for input(s): "CKTOTAL", "CKMB", "CKMBINDEX", "TROPONINI" in the last 168 hours.  BNP: Invalid input(s): "POCBNP"  CBG: No results for input(s): "GLUCAP" in the last 168 hours.  Microbiology: Results for orders placed or performed during the hospital encounter of 09/14/22  Resp panel by RT-PCR (RSV, Flu A&B, Covid) Anterior Nasal Swab     Status: None   Collection Time: 09/14/22  7:10 PM   Specimen: Anterior Nasal Swab  Result Value Ref Range Status   SARS Coronavirus 2 by RT PCR NEGATIVE NEGATIVE Final    Comment: (NOTE) SARS-CoV-2 target nucleic acids are NOT DETECTED.  The SARS-CoV-2 RNA is generally detectable in upper respiratory specimens during the acute phase of infection. The lowest concentration of SARS-CoV-2 viral copies this assay can detect is 138 copies/mL. A negative result does not preclude  SARS-Cov-2 infection and should not be used as the sole basis for treatment or other patient management decisions. A negative result may occur with  improper specimen collection/handling, submission of specimen other than nasopharyngeal swab, presence of viral mutation(s) within the areas targeted by this assay, and inadequate number of viral copies(<138 copies/mL). A negative result must be combined with clinical observations, patient history, and epidemiological information. The expected result is Negative.  Fact Sheet for Patients:  BloggerCourse.com  Fact Sheet for Healthcare Providers:  SeriousBroker.it  This test is no t yet approved or cleared by the  [place] FDA and  has been authorized for detection and/or diagnosis of SARS-CoV-2 by FDA under an Emergency Use Authorization (EUA). This EUA will remain  in effect (meaning this test can be used) for the duration of the COVID-19 declaration under Section 564(b)(1) of the Act, 21 U.S.C.section 360bbb-3(b)(1), unless the authorization is terminated  or revoked sooner.       Influenza A by PCR NEGATIVE NEGATIVE Final   Influenza B by PCR NEGATIVE NEGATIVE Final    Comment: (NOTE) The Xpert Xpress SARS-CoV-2/FLU/RSV plus assay is intended as an aid in the diagnosis of influenza from Nasopharyngeal swab specimens and should not be used as a sole basis for treatment. Nasal washings and aspirates are unacceptable for Xpert Xpress SARS-CoV-2/FLU/RSV testing.  Fact Sheet for Patients: BloggerCourse.com  Fact Sheet for Healthcare Providers: SeriousBroker.it  This test is not yet approved or cleared by the [place] FDA and has been authorized for detection and/or diagnosis of SARS-CoV-2 by FDA under an Emergency Use Authorization (EUA). This EUA will remain in effect (meaning this test can be used) for the duration of the COVID-19 declaration under Section 564(b)(1) of the Act, 21 U.S.C. section 360bbb-3(b)(1), unless the authorization is terminated or revoked.     Resp Syncytial Virus by PCR NEGATIVE NEGATIVE Final    Comment: (NOTE) Fact Sheet for Patients: BloggerCourse.com  Fact Sheet for Healthcare Providers: SeriousBroker.it  This test is not yet approved or cleared by the [place] FDA and has been authorized for detection and/or diagnosis of SARS-CoV-2 by FDA under an Emergency Use Authorization (EUA). This EUA will remain in effect (meaning this test can be used) for the duration of the COVID-19 declaration under Section 564(b)(1) of the Act, 21 U.S.C. section  360bbb-3(b)(1), unless the authorization is terminated or revoked.  Performed at [facility], [address], [place], [place] [address]   Blood Culture (routine x 2)     Status: Abnormal   Collection Time: 09/14/22  7:10 PM   Specimen: BLOOD  Result Value Ref Range Status   Specimen Description   Final    BLOOD BLOOD RIGHT WRIST Performed at [facility], [address] [place], [place] [address]    Special Requests   Final    BOTTLES DRAWN AEROBIC AND ANAEROBIC Blood Culture results may not be optimal due to an inadequate volume of blood received in culture bottles Performed at [facility], [address] [place], [place] [address]    Culture  Setup Time   Final    GRAM POSITIVE COCCI IN BOTH AEROBIC AND ANAEROBIC BOTTLES CRITICAL RESULT CALLED TO, READ BACK BY AND VERIFIED WITH: Albertine Grates 09/15/22 0715 MW Performed at [facility], [address], [place], [place] [address]    Culture STREPTOCOCCUS GALLOLYTICUS (A)  Final   Report Status 09/17/2022 FINAL  Final   Organism ID, Bacteria STREPTOCOCCUS GALLOLYTICUS  Final  Susceptibility   Streptococcus gallolyticus - MIC*    PENICILLIN 0.12 SENSITIVE Sensitive     CEFTRIAXONE <=0.12 SENSITIVE Sensitive     ERYTHROMYCIN >=8 RESISTANT Resistant     LEVOFLOXACIN 2 SENSITIVE Sensitive     VANCOMYCIN 0.5 SENSITIVE Sensitive     * STREPTOCOCCUS GALLOLYTICUS  Blood Culture ID Panel (Reflexed)     Status: Abnormal   Collection Time: 09/14/22  7:10 PM  Result Value Ref Range Status   Enterococcus faecalis NOT DETECTED NOT DETECTED Final   Enterococcus Faecium NOT DETECTED NOT DETECTED Final   Listeria monocytogenes NOT DETECTED NOT DETECTED Final   Staphylococcus species NOT DETECTED NOT DETECTED Final   Staphylococcus aureus (BCID) NOT DETECTED NOT DETECTED Final   Staphylococcus epidermidis NOT DETECTED NOT DETECTED Final   Staphylococcus lugdunensis NOT DETECTED NOT DETECTED Final    Streptococcus species DETECTED (A) NOT DETECTED Final    Comment: Not Enterococcus species, Streptococcus agalactiae, Streptococcus pyogenes, or Streptococcus pneumoniae. CRITICAL RESULT CALLED TO, READ BACK BY AND VERIFIED WITH: TRAY GREENWOOD 09/15/22 0715 MW    Streptococcus agalactiae NOT DETECTED NOT DETECTED Final   Streptococcus pneumoniae NOT DETECTED NOT DETECTED Final   Streptococcus pyogenes NOT DETECTED NOT DETECTED Final   A.calcoaceticus-baumannii NOT DETECTED NOT DETECTED Final   Bacteroides fragilis NOT DETECTED NOT DETECTED Final   Enterobacterales NOT DETECTED NOT DETECTED Final   Enterobacter cloacae complex NOT DETECTED NOT DETECTED Final   Escherichia coli NOT DETECTED NOT DETECTED Final   Klebsiella aerogenes NOT DETECTED NOT DETECTED Final   Klebsiella oxytoca NOT DETECTED NOT DETECTED Final   Klebsiella pneumoniae NOT DETECTED NOT DETECTED Final   Proteus species NOT DETECTED NOT DETECTED Final   Salmonella species NOT DETECTED NOT DETECTED Final   Serratia marcescens NOT DETECTED NOT DETECTED Final   Haemophilus influenzae NOT DETECTED NOT DETECTED Final   Neisseria meningitidis NOT DETECTED NOT DETECTED Final   Pseudomonas aeruginosa NOT DETECTED NOT DETECTED Final   Stenotrophomonas maltophilia NOT DETECTED NOT DETECTED Final   Candida albicans NOT DETECTED NOT DETECTED Final   Candida auris NOT DETECTED NOT DETECTED Final   Candida glabrata NOT DETECTED NOT DETECTED Final   Candida krusei NOT DETECTED NOT DETECTED Final   Candida parapsilosis NOT DETECTED NOT DETECTED Final   Candida tropicalis NOT DETECTED NOT DETECTED Final   Cryptococcus neoformans/gattii NOT DETECTED NOT DETECTED Final    Comment: Performed at [facility], [address] [place], [place] [address]  Blood Culture (routine x 2)     Status: Abnormal   Collection Time: 09/14/22  7:15 PM   Specimen: BLOOD  Result Value Ref Range Status   Specimen Description   Final     BLOOD RIGHT ANTECUBITAL Performed at [facility], [address] [place], [place] [address]    Special Requests   Final    BOTTLES DRAWN AEROBIC AND ANAEROBIC Blood Culture results may not be optimal due to an inadequate volume of blood received in culture bottles Performed at [facility], [address] [place], [place] [address]    Culture  Setup Time   Final    GRAM POSITIVE COCCI IN BOTH AEROBIC AND ANAEROBIC BOTTLES CRITICAL RESULT CALLED TO, READ BACK BY AND VERIFIED WITH: TRAY GREENWOOD 09/15/22 0715 MW CRITICAL VALUE NOTED.  VALUE IS CONSISTENT WITH PREVIOUSLY REPORTED AND CALLED VALUE.    Culture (A)  Final    STREPTOCOCCUS GALLOLYTICUS SUSCEPTIBILITIES PERFORMED ON PREVIOUS CULTURE WITHIN THE LAST 5 DAYS. Performed at [facility], [address], [place], [place] [address]    Report Status 09/17/2022 FINAL  Final  Culture, blood (Routine X 2) w Reflex to ID Panel     Status: None (Preliminary result)   Collection Time: 09/17/22  8:49 AM   Specimen: BLOOD  Result Value Ref Range Status   Specimen Description BLOOD RIGHT HAND  Final   Special Requests   Final    BOTTLES DRAWN AEROBIC ONLY Blood Culture adequate volume   Culture   Final    NO GROWTH 3 DAYS Performed at [facility], [address] [place], [place] [address]    Report Status PENDING  Incomplete  Culture, blood (Routine X 2) w Reflex to ID Panel     Status: None (Preliminary result)   Collection Time: 09/17/22  8:49 AM   Specimen: BLOOD  Result Value Ref Range Status   Specimen Description BLOOD LEFT HAND  Final   Special Requests   Final    BOTTLES DRAWN AEROBIC AND ANAEROBIC Blood Culture results may not be optimal due to an excessive volume of blood received in culture bottles   Culture   Final    NO GROWTH 3 DAYS Performed at [facility], [address] [place], [place] [address]    Report Status PENDING  Incomplete  MRSA Next Gen by PCR, Nasal      Status: None   Collection Time: 09/18/22 10:51 PM   Specimen: Nasal Mucosa; Nasal Swab  Result Value Ref Range Status   MRSA by PCR Next Gen NOT DETECTED NOT DETECTED Final    Comment: (NOTE) The GeneXpert MRSA Assay (FDA approved for NASAL specimens only), is one component of a comprehensive MRSA colonization surveillance program. It is not intended to diagnose MRSA infection nor to guide or monitor treatment for MRSA infections. Test performance is not FDA approved in patients less than 62 years old. Performed at [facility], [address] [place], [place] [address]     Coagulation Studies: No results for input(s): "LABPROT", "INR" in the last 72 hours.   Urinalysis: No results for input(s): "COLORURINE", "LABSPEC", "PHURINE", "GLUCOSEU", "HGBUR", "BILIRUBINUR", "KETONESUR", "PROTEINUR", "UROBILINOGEN", "NITRITE", "LEUKOCYTESUR" in the last 72 hours.  Invalid input(s): "APPERANCEUR"    Imaging: No results found.   Medications:    cefTRIAXone (ROCEPHIN)  IV 2 g (09/20/22 0839)    amLODipine  10 mg Oral q morning   calcium acetate  1,334 mg Oral TID WC   carbamazepine  1,000 mg Oral q morning   cholecalciferol  1,000 Units Oral Daily   cloNIDine  0.2 mg Transdermal Weekly   ferrous sulfate  325 mg Oral Daily   fluPHENAZine  2.5 mg Oral Daily   fluticasone furoate-vilanterol  1 puff Inhalation Daily   heparin  5,000 Units Subcutaneous Q8H   hydrALAZINE  10 mg Oral Q8H   isosorbide mononitrate  60 mg Oral BID   levothyroxine  50 mcg Oral Q0600   multivitamin  1 tablet Oral QHS   pantoprazole  40 mg Oral Daily   trihexyphenidyl  2 mg Oral BID   zinc sulfate  220 mg Oral Daily   acetaminophen **OR** acetaminophen, albuterol, HYDROcodone-acetaminophen, morphine injection, ondansetron **OR** ondansetron (ZOFRAN) IV, polyethylene glycol  Assessment/ Plan:  Mr. LINTON STOLP is a 62 y.o.  male is a 62 year old male with past medical conditions  including COPD, hypertension, DVT, schizophrenia, and end-stage renal disease on hemodialysis.  Patient presents to the emergency department  with lethargy  [facility]/MWF/left aVF   Fluid volume overload with end stage renal disease on hemodialysis. CT chest abd pelvis show fluid overload with mild pulmonary edema and pleural effusions.    Next treatment scheduled for Monday.   2. Anemia of chronic kidney disease Lab Results  Component Value Date   HGB 10.7 (L) 09/18/2022    Hemoglobin at goal.  No need for ESA's at this time.  3. Secondary Hyperparathyroidism: with outpatient labs: PTH 459, phosphorus 3.4, calcium 9.7 on 06/08/22 .   Lab Results  Component Value Date   PTH 194 (H) 05/11/2017   CALCIUM 8.4 (L) 09/18/2022   CAION 1.19 03/27/2022   PHOS 2.5 09/18/2022  Will obtain updated labs in ED. Continue cholecalciferol and calcium acetate.  4.  Hypertension with chronic kidney disease.  Home regimen includes amlodipine, clonidine, hydralazine, isosorbide, doxazosin and minoxidil.  All currently held at this time.  Blood pressure stable.   LOS: 6   4/21/202410:29 AM

## 2022-09-20 NOTE — Progress Notes (Signed)
PROGRESS NOTE    DRESHON PROFFIT   ZOX:[id number] DOB: 17-Oct-1960  DOA: 09/14/2022 Date of Service: 09/20/22 PCP: Smiley Houseman, NP     Brief Narrative / Hospital Course:  TODD JELINSKI is a 62 y.o. 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  Patient was in his usual state of health earlier and went to and completed his dialysis session but on arrival home he was lethargic.  On arrival of EMS he was saturating in the 70s on room air and was placed on NRB for transport. Of note: bacteremia secondary to strep gallolyticus in 01/24. Suspected source was from the PermCath (PermCath was removed during that hospitalization). Standard ECHO was obtained which did not show any vegetation.  ID was consulted and they requested TEE and colonscopy, however, the pt did not agree to have these procedures. Pt was continued on IV cefazolin 2g MWF with dialysis for strep gallolyticus bacteremia (END DATE Jul 20 2022).   04/15: ED course and data review: Tmax 104.2 with pulse 107 respirations 23 and O2 sat initially 89% on NRB improving to 94% on HFNC at 6 L.  BP was 154/86 on arrival.  VBG on HFNC showed pH 7.5 with pCO2 40.  Labs otherwise significant for WBC 12,100, lactic acid 0.9 and procalcitonin 0.72.  Respiratory viral panel negative for COVID flu and RSV.  Troponin 81. Patient started on cefepime vancomycin and Flagyl for sepsis of unknown source. Hospitalist consulted for admission.  04/16: Blood culture revealed strep species. Discontinued cefepime, vancomycin and Flagyl and place patient on Rocephin 2 g IV daily. Consulted ID - recs for TEE and  colonoscopy, AV graft evauation. Patient did not receive IV fluid resuscitation due to fluid overload. Removed 2L in HD 04/17: dialysis today - examined afterward. Pt has no complaints other than wants something to eat. Removed another 2L in HD.  04/18: cardiology consulted for TEE - await improvement in volume status, TEE will have to be w/ [facility] not [facility] d/t staffing. Repeating BCx. Will d/w guardian re: consent for TEE - she states he was amenable to TEE but did not want colonoscopy.  04/19-04/21: stable thru weekend, planning TEE Monday 04/22  Consultants:  Nephrology  Infectious Disease  Cardiology   Procedures: None       ASSESSMENT & PLAN:   Principal Problem:   Acute on chronic respiratory failure with hypoxia Active Problems:   Severe sepsis versus SIRS   CAP (community acquired pneumonia)   Anasarca (mild pulmonary edema, pleural and pericardial effusion, ascites on CT)   Acute metabolic encephalopathy   ESRD on dialysis   Essential hypertension   COPD (chronic obstructive pulmonary disease)   Schizophrenia   Essential hypertension, benign   Bacteremia   Acute on chronic respiratory failure  Severe sepsis  Community-acquired pneumonia  Streptococcus bacteremia.   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  Organ dysfunction of respiratory failure and  altered mental status. CT chest abdomen and pelvis consistent with anasarca.  Cannot rule out superimposed pulmonary infection Blood culture revealed strep species Continue Rocephin 2 g IV daily Consult ID --> will  need TEE and colonoscopy, evaluate graft  Patient did not receive IV fluid resuscitation due to fluid overload, conitnue w/ HD Per his guardian, he was amenable to TEE last time but not to colonoscopy - he seems amenable now and guardian plan on signing consent  Goals of  care Patient does not demonstrate adequate understanding of his medical illness(es), he can not make an informed decision about complex treatments - consent or refusal.  Given frailty and multiple comorbidities, he is less likely to survive resuscitation measures in the event of a cardiopulmonary arrest but I am not convinced at this time that resuscitation would be utterly futile, either. He has no terminal illness at this time which would recommend strongly for DNR/DNI status, especially since he has not voiced that he would NOT desire CPR or life support measures  guardian will have to give consent/refusal for TEE or other procedures  Consider palliative care consult pending clinical course   Acute on chronic respiratory failure with hypoxia - improving Etiology acute on chronic diastolic CHF and CAP  Continue O2 to maintain sats over 92% and wean as tolerated  Acute metabolic encephalopathy - resolved Secondary to sepsis from Streptococcus bacteremia Patient is oriented to person and place but not to time Monitor patient closely    Acute on chronic diastolic dysfunction CHF Anasarca  EF 06/2022 showing EF 60 to 65% with grade 1 diastolic dysfunction CT with findings of volume overload, mild pulmonary edema with pleural effusions, small volume pericardial effusion and anasarca and small volume fluid ascites. Net IO Since Admission: -1,570 mL [09/19/22 1259] Nephrology consult for management of fluid status   ESRD on dialysis Nephrology consult for renal replacement therapy   Essential hypertension continue home meds    COPD (chronic obstructive pulmonary disease) Continue home inhalers DuoNebs as needed   Schizophrenia Continue Prolixin and carbamazepine  Vision impairment Precautions for fall   DVT prophylaxis: heparin Pertinent IV fluids/nutrition: no continuous IV fluids, cardiac diet  Central lines / invasive devices: none  Code Status: FULL CODE ACP documentation  reviewed: none on file   Current Admission Status: inpatient  TOC needs / Dispo plan: HH PT Barriers to discharge / significant pending items: bacteremia, pend repeat cultures, ID recs, fluid overload, TEE, IV abx, will be here several more days pending results     Lenn Sink - guardian [phone] = fax            Subjective / Brief ROS:  Patient reports no concerns  Denies CP/SOB.  Pain controlled.    Family Communication: called legal guardian to give update Friday, will reach out again tomorrow Monday      Objective Findings:  Vitals:   09/19/22 1912 09/20/22 0512 09/20/22 0710 09/20/22 0721  BP: 130/80 (!) 165/90 (!) 179/85 (!) 157/84  Pulse: 63 (!) 56 (!) 56 (!) 56  Resp: 16 16 14    Temp: 98.6 F (37 C) 98.2 F (36.8 C) 97.7 F (36.5 C)   TempSrc: Oral Oral    SpO2: 98% 100% 99%   Weight:      Height:        Intake/Output Summary (Last 24 hours) at 09/20/2022 1208 Last data filed at 09/20/2022 0500 Gross per 24 hour  Intake 860 ml  Output 0 ml  Net 860 ml   Filed Weights   09/18/22 0958 09/18/22 1500  Weight: 77.5 kg 74.6 kg    Examination:  Physical Exam Constitutional:      General: He is  not in acute distress. Cardiovascular:     Rate and Rhythm: Normal rate and regular rhythm.  Pulmonary:     Effort: Pulmonary effort is normal.     Breath sounds: Normal breath sounds.  Abdominal:     General: Abdomen is flat.     Palpations: Abdomen is soft.  Musculoskeletal:     Right lower leg: No edema.     Left lower leg: No edema.  Skin:    General: Skin is warm and dry.  Neurological:     General: No focal deficit present.     Mental Status: He is alert. Mental status is at baseline.  Psychiatric:        Behavior: Behavior normal.          Scheduled Medications:   amLODipine  10 mg Oral q morning   calcium acetate  1,334 mg Oral TID WC   carbamazepine  1,000 mg Oral q morning   cholecalciferol  1,000 Units Oral Daily    cloNIDine  0.2 mg Transdermal Weekly   ferrous sulfate  325 mg Oral Daily   fluPHENAZine  2.5 mg Oral Daily   fluticasone furoate-vilanterol  1 puff Inhalation Daily   heparin  5,000 Units Subcutaneous Q8H   hydrALAZINE  10 mg Oral Q8H   isosorbide mononitrate  60 mg Oral BID   levothyroxine  50 mcg Oral Q0600   multivitamin  1 tablet Oral QHS   pantoprazole  40 mg Oral Daily   trihexyphenidyl  2 mg Oral BID   zinc sulfate  220 mg Oral Daily    Continuous Infusions:  cefTRIAXone (ROCEPHIN)  IV 2 g (09/20/22 0839)    PRN Medications:  acetaminophen **OR** acetaminophen, albuterol, HYDROcodone-acetaminophen, morphine injection, ondansetron **OR** ondansetron (ZOFRAN) IV, polyethylene glycol  Antimicrobials from admission:  Anti-infectives (From admission, onward)    Start     Dose/Rate Route Frequency Ordered Stop   09/16/22 2200  vancomycin (VANCOREADY) IVPB 750 mg/150 mL  Status:  Discontinued        750 mg 150 mL/hr over 60 Minutes Intravenous Every M-W-F (Hemodialysis) 09/15/22 0031 09/15/22 1329   09/15/22 1000  ceFEPIme (MAXIPIME) 2 g in sodium chloride 0.9 % 100 mL IVPB  Status:  Discontinued        2 g 200 mL/hr over 30 Minutes Intravenous Every 12 hours 09/15/22 0031 09/15/22 0734   09/15/22 0800  metroNIDAZOLE (FLAGYL) IVPB 500 mg  Status:  Discontinued        500 mg 100 mL/hr over 60 Minutes Intravenous Every 12 hours 09/14/22 2248 09/15/22 1329   09/15/22 0800  cefTRIAXone (ROCEPHIN) 2 g in sodium chloride 0.9 % 100 mL IVPB        2 g 200 mL/hr over 30 Minutes Intravenous Every 24 hours 09/15/22 0734     09/15/22 0000  vancomycin (VANCOREADY) IVPB 750 mg/150 mL        750 mg 150 mL/hr over 60 Minutes Intravenous  Once 09/14/22 2258 09/15/22 0200   09/14/22 1915  ceFEPIme (MAXIPIME) 2 g in sodium chloride 0.9 % 100 mL IVPB        2 g 200 mL/hr over 30 Minutes Intravenous  Once 09/14/22 1909 09/14/22 1949   09/14/22 1915  metroNIDAZOLE (FLAGYL) IVPB 500 mg         500 mg 100 mL/hr over 60 Minutes Intravenous  Once 09/14/22 1909 09/14/22 2052   09/14/22 1915  vancomycin (VANCOCIN) IVPB 1000 mg/200 mL premix  1,000 mg 200 mL/hr over 60 Minutes Intravenous  Once 09/14/22 1909 09/14/22 2210           Data Reviewed:  I have personally reviewed the following...  CBC: Recent Labs  Lab 09/14/22 1910 09/15/22 0752 09/17/22 0448 09/18/22 1120  WBC 12.1* 12.9* 5.1 3.7*  NEUTROABS 10.9*  --   --   --   HGB 13.2 13.3 11.6* 10.7*  HCT 40.0 40.2 35.2* 32.4*  MCV 101.8* 101.8* 101.7* 100.9*  PLT 183 186 112* 154   Basic Metabolic Panel: Recent Labs  Lab 09/14/22 1910 09/15/22 1123 09/17/22 0448 09/18/22 1120  NA 138 133* 136 134*  K 4.5 4.7 3.9 3.9  CL 95* 94* 96* 96*  CO2 29 27 29 29   GLUCOSE 80 98 89 151*  BUN 34* 44* 29* 29*  CREATININE 6.35* 7.30* 5.16* 4.74*  CALCIUM 9.2 9.1 8.7* 8.4*  PHOS  --  3.7  --  2.5   GFR: Estimated Creatinine Clearance: 17.3 mL/min (A) (by C-G formula based on SCr of 4.74 mg/dL (H)). Liver Function Tests: Recent Labs  Lab 09/14/22 1910 09/15/22 1123 09/18/22 1120  AST 31  --   --   ALT 17  --   --   ALKPHOS 102  --   --   BILITOT 1.0  --   --   PROT 7.3  --   --   ALBUMIN 3.7 2.9* 2.7*   No results for input(s): "LIPASE", "AMYLASE" in the last 168 hours. No results for input(s): "AMMONIA" in the last 168 hours. Coagulation Profile: Recent Labs  Lab 09/14/22 1910  INR 1.1   Cardiac Enzymes: No results for input(s): "CKTOTAL", "CKMB", "CKMBINDEX", "TROPONINI" in the last 168 hours. BNP (last 3 results) No results for input(s): "PROBNP" in the last 8760 hours. HbA1C: No results for input(s): "HGBA1C" in the last 72 hours. CBG: No results for input(s): "GLUCAP" in the last 168 hours. Lipid Profile: No results for input(s): "CHOL", "HDL", "LDLCALC", "TRIG", "CHOLHDL", "LDLDIRECT" in the last 72 hours. Thyroid Function Tests: No results for input(s): "TSH", "T4TOTAL", "FREET4",  "T3FREE", "THYROIDAB" in the last 72 hours. Anemia Panel: No results for input(s): "VITAMINB12", "FOLATE", "FERRITIN", "TIBC", "IRON", "RETICCTPCT" in the last 72 hours. Most Recent Urinalysis On File:     Component Value Date/Time   COLORURINE YELLOW (A) 05/17/2015 0012   APPEARANCEUR HAZY (A) 05/17/2015 0012   LABSPEC 1.009 05/17/2015 0012   PHURINE 7.0 05/17/2015 0012   GLUCOSEU NEGATIVE 05/17/2015 0012   HGBUR 1+ (A) 05/17/2015 0012   BILIRUBINUR NEGATIVE 05/17/2015 0012   KETONESUR NEGATIVE 05/17/2015 0012   PROTEINUR 30 (A) 05/17/2015 0012   NITRITE NEGATIVE 05/17/2015 0012   LEUKOCYTESUR 3+ (A) 05/17/2015 0012   Sepsis Labs: @LABRCNTIP (procalcitonin:4,lacticidven:4) Microbiology: Recent Results (from the past 240 hour(s))  Resp panel by RT-PCR (RSV, Flu A&B, Covid) Anterior Nasal Swab     Status: None   Collection Time: 09/14/22  7:10 PM   Specimen: Anterior Nasal Swab  Result Value Ref Range Status   SARS Coronavirus 2 by RT PCR NEGATIVE NEGATIVE Final    Comment: (NOTE) SARS-CoV-2 target nucleic acids are NOT DETECTED.  The SARS-CoV-2 RNA is generally detectable in upper respiratory specimens during the acute phase of infection. The lowest concentration of SARS-CoV-2 viral copies this assay can detect is 138 copies/mL. A negative result does not preclude SARS-Cov-2 infection and should not be used as the sole basis for treatment or other patient management decisions. A negative result  may occur with  improper specimen collection/handling, submission of specimen other than nasopharyngeal swab, presence of viral mutation(s) within the areas targeted by this assay, and inadequate number of viral copies(<138 copies/mL). A negative result must be combined with clinical observations, patient history, and epidemiological information. The expected result is Negative.  Fact Sheet for Patients:  BloggerCourse.com  Fact Sheet for Healthcare  Providers:  SeriousBroker.it  This test is no t yet approved or cleared by the [place] FDA and  has been authorized for detection and/or diagnosis of SARS-CoV-2 by FDA under an Emergency Use Authorization (EUA). This EUA will remain  in effect (meaning this test can be used) for the duration of the COVID-19 declaration under Section 564(b)(1) of the Act, 21 U.S.C.section 360bbb-3(b)(1), unless the authorization is terminated  or revoked sooner.       Influenza A by PCR NEGATIVE NEGATIVE Final   Influenza B by PCR NEGATIVE NEGATIVE Final    Comment: (NOTE) The Xpert Xpress SARS-CoV-2/FLU/RSV plus assay is intended as an aid in the diagnosis of influenza from Nasopharyngeal swab specimens and should not be used as a sole basis for treatment. Nasal washings and aspirates are unacceptable for Xpert Xpress SARS-CoV-2/FLU/RSV testing.  Fact Sheet for Patients: BloggerCourse.com  Fact Sheet for Healthcare Providers: SeriousBroker.it  This test is not yet approved or cleared by the [place] FDA and has been authorized for detection and/or diagnosis of SARS-CoV-2 by FDA under an Emergency Use Authorization (EUA). This EUA will remain in effect (meaning this test can be used) for the duration of the COVID-19 declaration under Section 564(b)(1) of the Act, 21 U.S.C. section 360bbb-3(b)(1), unless the authorization is terminated or revoked.     Resp Syncytial Virus by PCR NEGATIVE NEGATIVE Final    Comment: (NOTE) Fact Sheet for Patients: BloggerCourse.com  Fact Sheet for Healthcare Providers: SeriousBroker.it  This test is not yet approved or cleared by the [place] FDA and has been authorized for detection and/or diagnosis of SARS-CoV-2 by FDA under an Emergency Use Authorization (EUA). This EUA will remain in effect (meaning this test can be  used) for the duration of the COVID-19 declaration under Section 564(b)(1) of the Act, 21 U.S.C. section 360bbb-3(b)(1), unless the authorization is terminated or revoked.  Performed at [facility], [address], [place], [place] [address]   Blood Culture (routine x 2)     Status: Abnormal   Collection Time: 09/14/22  7:10 PM   Specimen: BLOOD  Result Value Ref Range Status   Specimen Description   Final    BLOOD BLOOD RIGHT WRIST Performed at [facility], [address], [place], [place] [address]    Special Requests   Final    BOTTLES DRAWN AEROBIC AND ANAEROBIC Blood Culture results may not be optimal due to an inadequate volume of blood received in culture bottles Performed at [facility], [address], [place], [place] [address]    Culture  Setup Time   Final    GRAM POSITIVE COCCI IN BOTH AEROBIC AND ANAEROBIC BOTTLES CRITICAL RESULT CALLED TO, READ BACK BY AND VERIFIED WITH: Albertine Grates 09/15/22 0715 MW Performed at [facility], [address], [place], [place] [address]    Culture STREPTOCOCCUS GALLOLYTICUS (A)  Final   Report Status 09/17/2022 FINAL  Final   Organism ID, Bacteria STREPTOCOCCUS GALLOLYTICUS  Final      Susceptibility   Streptococcus gallolyticus - MIC*    PENICILLIN 0.12 SENSITIVE Sensitive  CEFTRIAXONE <=0.12 SENSITIVE Sensitive     ERYTHROMYCIN >=8 RESISTANT Resistant     LEVOFLOXACIN 2 SENSITIVE Sensitive     VANCOMYCIN 0.5 SENSITIVE Sensitive     * STREPTOCOCCUS GALLOLYTICUS  Blood Culture ID Panel (Reflexed)     Status: Abnormal   Collection Time: 09/14/22  7:10 PM  Result Value Ref Range Status   Enterococcus faecalis NOT DETECTED NOT DETECTED Final   Enterococcus Faecium NOT DETECTED NOT DETECTED Final   Listeria monocytogenes NOT DETECTED NOT DETECTED Final   Staphylococcus species NOT DETECTED NOT DETECTED Final   Staphylococcus aureus (BCID) NOT DETECTED NOT DETECTED Final   Staphylococcus  epidermidis NOT DETECTED NOT DETECTED Final   Staphylococcus lugdunensis NOT DETECTED NOT DETECTED Final   Streptococcus species DETECTED (A) NOT DETECTED Final    Comment: Not Enterococcus species, Streptococcus agalactiae, Streptococcus pyogenes, or Streptococcus pneumoniae. CRITICAL RESULT CALLED TO, READ BACK BY AND VERIFIED WITH: TRAY GREENWOOD 09/15/22 0715 MW    Streptococcus agalactiae NOT DETECTED NOT DETECTED Final   Streptococcus pneumoniae NOT DETECTED NOT DETECTED Final   Streptococcus pyogenes NOT DETECTED NOT DETECTED Final   A.calcoaceticus-baumannii NOT DETECTED NOT DETECTED Final   Bacteroides fragilis NOT DETECTED NOT DETECTED Final   Enterobacterales NOT DETECTED NOT DETECTED Final   Enterobacter cloacae complex NOT DETECTED NOT DETECTED Final   Escherichia coli NOT DETECTED NOT DETECTED Final   Klebsiella aerogenes NOT DETECTED NOT DETECTED Final   Klebsiella oxytoca NOT DETECTED NOT DETECTED Final   Klebsiella pneumoniae NOT DETECTED NOT DETECTED Final   Proteus species NOT DETECTED NOT DETECTED Final   Salmonella species NOT DETECTED NOT DETECTED Final   Serratia marcescens NOT DETECTED NOT DETECTED Final   Haemophilus influenzae NOT DETECTED NOT DETECTED Final   Neisseria meningitidis NOT DETECTED NOT DETECTED Final   Pseudomonas aeruginosa NOT DETECTED NOT DETECTED Final   Stenotrophomonas maltophilia NOT DETECTED NOT DETECTED Final   Candida albicans NOT DETECTED NOT DETECTED Final   Candida auris NOT DETECTED NOT DETECTED Final   Candida glabrata NOT DETECTED NOT DETECTED Final   Candida krusei NOT DETECTED NOT DETECTED Final   Candida parapsilosis NOT DETECTED NOT DETECTED Final   Candida tropicalis NOT DETECTED NOT DETECTED Final   Cryptococcus neoformans/gattii NOT DETECTED NOT DETECTED Final    Comment: Performed at [facility], [address] [place], [place] [address]  Blood Culture (routine x 2)     Status: Abnormal   Collection Time:  09/14/22  7:15 PM   Specimen: BLOOD  Result Value Ref Range Status   Specimen Description   Final    BLOOD RIGHT ANTECUBITAL Performed at [facility], [address] [place], [place] [address]    Special Requests   Final    BOTTLES DRAWN AEROBIC AND ANAEROBIC Blood Culture results may not be optimal due to an inadequate volume of blood received in culture bottles Performed at [facility], [address] [place], [place] [address]    Culture  Setup Time   Final    GRAM POSITIVE COCCI IN BOTH AEROBIC AND ANAEROBIC BOTTLES CRITICAL RESULT CALLED TO, READ BACK BY AND VERIFIED WITH: TRAY GREENWOOD 09/15/22 0715 MW CRITICAL VALUE NOTED.  VALUE IS CONSISTENT WITH PREVIOUSLY REPORTED AND CALLED VALUE.    Culture (A)  Final    STREPTOCOCCUS GALLOLYTICUS SUSCEPTIBILITIES PERFORMED ON PREVIOUS CULTURE WITHIN THE LAST 5 DAYS. Performed at [facility], [address] [place], [place] [address]    Report Status 09/17/2022 FINAL  Final  Culture, blood (Routine X 2) w Reflex to ID Panel     Status: None (Preliminary result)   Collection Time: 09/17/22  8:49 AM   Specimen: BLOOD  Result Value Ref Range Status   Specimen Description BLOOD RIGHT HAND  Final   Special Requests   Final    BOTTLES DRAWN AEROBIC ONLY Blood Culture adequate volume   Culture   Final    NO GROWTH 3 DAYS Performed at [facility], [address] [place], [place] [address]    Report Status PENDING  Incomplete  Culture, blood (Routine X 2) w Reflex to ID Panel     Status: None (Preliminary result)   Collection Time: 09/17/22  8:49 AM   Specimen: BLOOD  Result Value Ref Range Status   Specimen Description BLOOD LEFT HAND  Final   Special Requests   Final    BOTTLES DRAWN AEROBIC AND ANAEROBIC Blood Culture results may not be optimal due to an excessive volume of blood received in culture bottles   Culture   Final    NO GROWTH 3 DAYS Performed at [facility], [address] [place], [place] [address]    Report Status PENDING  Incomplete  MRSA Next Gen by PCR, Nasal     Status: None   Collection Time: 09/18/22 10:51 PM   Specimen: Nasal Mucosa; Nasal Swab  Result Value Ref Range Status   MRSA by PCR Next Gen NOT DETECTED NOT DETECTED Final    Comment: (NOTE) The GeneXpert MRSA Assay (FDA approved for NASAL specimens only), is one component of a comprehensive MRSA colonization surveillance program. It is not intended to diagnose MRSA infection nor to guide or monitor treatment for MRSA infections. Test performance is not FDA approved in patients less than 50 years old. Performed at [facility], [address], [place], [place] [address]       Radiology Studies last 3 days: ECHOCARDIOGRAM COMPLETE  Result Date: 09/17/2022    ECHOCARDIOGRAM REPORT   Patient Name:   Todd Brown Date of Exam: 09/17/2022 Medical Rec #:  [id number]        Height:       71.0 in Accession #:    [id number]       Weight:       175.0 lb Date of Birth:  [birth year]        BSA:          1.992 m Patient Age:    61 years         BP:           163/89 mmHg Patient Gender: M                HR:           64 bpm. Exam Location:  [facility] Procedure: 2D Echo, Cardiac Doppler and Color Doppler Indications:     Bacteremia  History:         Patient has prior history of Echocardiogram examinations, most                  recent 06/20/2022. Previous Myocardial Infarction, COPD,                  Signs/Symptoms:Bacteremia and Altered Mental Status; Risk                  Factors:Hypertension. ESRD on Dialysis.  Sonographer:     Mikki Harbor Referring Phys:  [id number] Lynn Ito  Diagnosing Phys: Julien Nordmann MD  Sonographer Comments: Image acquisition challenging due to uncooperative patient. IMPRESSIONS  1. No valve vegetation noted. Leaflets well visualized.  2. Left ventricular ejection fraction, by estimation, is 50 to 55%. The left ventricle has low normal function. The  left ventricle has no regional wall motion abnormalities. There is severe left ventricular hypertrophy. Left ventricular diastolic parameters are consistent with Grade I diastolic dysfunction (impaired relaxation). There is the interventricular septum is flattened in systole and diastole, consistent with right ventricular pressure and volume overload.  3. Right ventricular systolic function is moderately reduced. The right ventricular size is moderately enlarged. Moderately increased right ventricular wall thickness. There is moderately elevated pulmonary artery systolic pressure. The estimated right ventricular systolic pressure is 59.1 mmHg.  4. Left atrial size was severely dilated.  5. Right atrial size was severely dilated.  6. The mitral valve is normal in structure. No evidence of mitral valve regurgitation. No evidence of mitral stenosis.  7. Tricuspid valve regurgitation is moderate to severe.  8. The aortic valve is normal in structure. Aortic valve regurgitation is mild. Aortic valve sclerosis is present, with no evidence of aortic valve stenosis.  9. There is mild dilatation of the aortic root, measuring 41 mm. 10. The inferior vena cava is normal in size with greater than 50% respiratory variability, suggesting right atrial pressure of 3 mmHg. FINDINGS  Left Ventricle: Left ventricular ejection fraction, by estimation, is 50 to 55%. The left ventricle has low normal function. The left ventricle has no regional wall motion abnormalities. The left ventricular internal cavity size was normal in size. There is severe left ventricular hypertrophy. The interventricular septum is flattened in systole and diastole, consistent with right ventricular pressure and volume overload. Left ventricular diastolic parameters are consistent with Grade I diastolic dysfunction (impaired relaxation). Right Ventricle: The right ventricular size is moderately enlarged. Moderately increased right ventricular wall thickness.  Right ventricular systolic function is moderately reduced. There is moderately elevated pulmonary artery systolic pressure. The tricuspid regurgitant velocity is 3.32 m/s, and with an assumed right atrial pressure of 15 mmHg, the estimated right ventricular systolic pressure is 59.1 mmHg. Left Atrium: Left atrial size was severely dilated. Right Atrium: Right atrial size was severely dilated. Pericardium: Trivial pericardial effusion is present. Mitral Valve: The mitral valve is normal in structure. No evidence of mitral valve regurgitation. No evidence of mitral valve stenosis. MV peak gradient, 3.3 mmHg. The mean mitral valve gradient is 1.0 mmHg. There is no evidence of mitral valve vegetation. Tricuspid Valve: The tricuspid valve is normal in structure. Tricuspid valve regurgitation is moderate to severe. No evidence of tricuspid stenosis. There is no evidence of tricuspid valve vegetation. Aortic Valve: The aortic valve is normal in structure. Aortic valve regurgitation is mild. Aortic regurgitation PHT measures 603 msec. Aortic valve sclerosis is present, with no evidence of aortic valve stenosis. Aortic valve mean gradient measures 4.0 mmHg. Aortic valve peak gradient measures 8.1 mmHg. Aortic valve area, by VTI measures 4.39 cm. There is no evidence of aortic valve vegetation. Pulmonic Valve: The pulmonic valve was normal in structure. Pulmonic valve regurgitation is mild to moderate. No evidence of pulmonic stenosis. There is no evidence of pulmonic valve vegetation. Aorta: The aortic root is normal in size and structure. There is mild dilatation of the aortic root, measuring 41 mm. Venous: The inferior vena cava is normal in size with greater than 50% respiratory variability, suggesting right atrial pressure of 3 mmHg. IAS/Shunts: No atrial  level shunt detected by color flow Doppler.  LEFT VENTRICLE PLAX 2D LVIDd:         4.20 cm   Diastology LVIDs:         2.90 cm   LV e' medial:  9.79 cm/s LV PW:          1.70 cm   LV e' lateral: 8.70 cm/s LV IVS:        2.40 cm LVOT diam:     2.50 cm LV SV:         117 LV SV Index:   59 LVOT Area:     4.91 cm  RIGHT VENTRICLE RV Basal diam:  5.35 cm RV Mid diam:    4.50 cm RV S prime:     13.70 cm/s TAPSE (M-mode): 2.4 cm LEFT ATRIUM              Index        RIGHT ATRIUM           Index LA diam:        4.70 cm  2.36 cm/m   RA Area:     34.70 cm LA Vol (A2C):   119.0 ml 59.73 ml/m  RA Volume:   152.00 ml 76.30 ml/m LA Vol (A4C):   85.3 ml  42.82 ml/m LA Biplane Vol: 102.0 ml 51.20 ml/m  AORTIC VALVE                    PULMONIC VALVE AV Area (Vmax):    4.67 cm     PV Vmax:       0.91 m/s AV Area (Vmean):   4.50 cm     PV Peak grad:  3.3 mmHg AV Area (VTI):     4.39 cm AV Vmax:           142.00 cm/s AV Vmean:          85.400 cm/s AV VTI:            0.267 m AV Peak Grad:      8.1 mmHg AV Mean Grad:      4.0 mmHg LVOT Vmax:         135.00 cm/s LVOT Vmean:        78.300 cm/s LVOT VTI:          0.239 m LVOT/AV VTI ratio: 0.90 AI PHT:            603 msec  AORTA Ao Root diam: 4.10 cm Ao Asc diam:  3.20 cm MITRAL VALVE              TRICUSPID VALVE MV Area (PHT): 2.02 cm   TR Peak grad:   44.1 mmHg MV Area VTI:   3.34 cm   TR Vmax:        332.00 cm/s MV Peak grad:  3.3 mmHg MV Mean grad:  1.0 mmHg   SHUNTS MV Vmax:       0.91 m/s   Systemic VTI:  0.24 m MV Vmean:      55.5 cm/s  Systemic Diam: 2.50 cm Julien Nordmann MD Electronically signed by Julien Nordmann MD Signature Date/Time: 09/17/2022/4:38:57 PM    Final              LOS: 6 days        Sunnie Nielsen, DO [facility] 09/20/2022, 12:08 PM    Dictation software may have been used to generate the above note. Typos may occur and escape  review in typed/dictated notes. Please contact Dr Lyn Hollingshead directly for clarity if needed.  Staff may message me via secure chat in Epic  but this may not receive an immediate response,  please page me for urgent matters!  If 7PM-7AM, please contact night  coverage [email or web address]

## 2022-09-21 ENCOUNTER — Encounter
Admission: EM | Disposition: A | Discharge status: Nursing Home (Includes ICF) | Payer: Self-pay | Source: Skilled Nursing Facility | Attending: Osteopathic Medicine

## 2022-09-21 ENCOUNTER — Inpatient Hospital Stay (HOSPITAL_COMMUNITY)
Admit: 2022-09-21 | Discharge: 2022-09-21 | Disposition: A | Discharge status: Home / Self Care | Payer: Medicaid Other | Attending: Physician Assistant | Admitting: Physician Assistant

## 2022-09-21 DIAGNOSIS — B955 Unspecified streptococcus as the cause of diseases classified elsewhere: Secondary | ICD-10-CM | POA: Diagnosis not present

## 2022-09-21 DIAGNOSIS — I340 Nonrheumatic mitral (valve) insufficiency: Secondary | ICD-10-CM

## 2022-09-21 DIAGNOSIS — N186 End stage renal disease: Secondary | ICD-10-CM | POA: Diagnosis not present

## 2022-09-21 DIAGNOSIS — I351 Nonrheumatic aortic (valve) insufficiency: Secondary | ICD-10-CM | POA: Diagnosis not present

## 2022-09-21 DIAGNOSIS — R7881 Bacteremia: Secondary | ICD-10-CM | POA: Diagnosis not present

## 2022-09-21 DIAGNOSIS — I3139 Other pericardial effusion (noninflammatory): Secondary | ICD-10-CM | POA: Diagnosis not present

## 2022-09-21 DIAGNOSIS — I361 Nonrheumatic tricuspid (valve) insufficiency: Secondary | ICD-10-CM | POA: Diagnosis not present

## 2022-09-21 DIAGNOSIS — G9341 Metabolic encephalopathy: Secondary | ICD-10-CM | POA: Diagnosis not present

## 2022-09-21 HISTORY — PX: TEE WITHOUT CARDIOVERSION: SHX5443

## 2022-09-21 LAB — ECHO TEE

## 2022-09-21 LAB — BASIC METABOLIC PANEL
Anion gap: 13 (ref 5–15)
BUN: 54 mg/dL — ABNORMAL HIGH (ref 8–23)
CO2: 23 mmol/L (ref 22–32)
Calcium: 9.3 mg/dL (ref 8.9–10.3)
Chloride: 97 mmol/L — ABNORMAL LOW (ref 98–111)
Creatinine, Ser: 7.63 mg/dL — ABNORMAL HIGH (ref 0.61–1.24)
GFR, Estimated: 7 mL/min — ABNORMAL LOW (ref >60)
Glucose, Bld: 73 mg/dL (ref 70–99)
Potassium: 5.3 mmol/L — ABNORMAL HIGH (ref 3.5–5.1)
Sodium: 133 mmol/L — ABNORMAL LOW (ref 135–145)

## 2022-09-21 LAB — CBC
HCT: 35.6 % — ABNORMAL LOW (ref 39.0–52.0)
Hemoglobin: 11.8 g/dL — ABNORMAL LOW (ref 13.0–17.0)
MCH: 33.1 pg (ref 26.0–34.0)
MCHC: 33.1 g/dL (ref 30.0–36.0)
MCV: 99.7 fL (ref 80.0–100.0)
Platelets: 190 10*3/uL (ref 150–400)
RBC: 3.57 MIL/uL — ABNORMAL LOW (ref 4.22–5.81)
RDW: 13.7 % (ref 11.5–15.5)
WBC: 5 10*3/uL (ref 4.0–10.5)
nRBC: 0 % (ref 0.0–0.2)

## 2022-09-21 SURGERY — ECHOCARDIOGRAM, TRANSESOPHAGEAL
Anesthesia: Moderate Sedation

## 2022-09-21 MED ORDER — LACTULOSE 10 GM/15ML PO SOLN
20.0000 g | Freq: Two times a day (BID) | ORAL | Status: DC | PRN
  Administered 2022-09-21: 20 g via ORAL
  Filled 2022-09-21: qty 30

## 2022-09-21 MED ORDER — BUTAMBEN-TETRACAINE-BENZOCAINE 2-2-14 % EX AERO
INHALATION_SPRAY | CUTANEOUS | Status: AC
  Filled 2022-09-21: qty 5

## 2022-09-21 MED ORDER — FENTANYL CITRATE (PF) 100 MCG/2ML IJ SOLN
INTRAMUSCULAR | Status: AC
  Filled 2022-09-21: qty 2

## 2022-09-21 MED ORDER — FENTANYL CITRATE (PF) 100 MCG/2ML IJ SOLN
INTRAMUSCULAR | Status: AC | PRN
  Administered 2022-09-21: 25 ug via INTRAVENOUS

## 2022-09-21 MED ORDER — LIDOCAINE VISCOUS HCL 2 % MT SOLN
OROMUCOSAL | Status: AC
  Filled 2022-09-21: qty 15

## 2022-09-21 MED ORDER — SODIUM CHLORIDE 0.9 % IV SOLN: INTRAVENOUS | Status: DC

## 2022-09-21 MED ORDER — MIDAZOLAM HCL 2 MG/2ML IJ SOLN
INTRAMUSCULAR | Status: AC
  Filled 2022-09-21: qty 4

## 2022-09-21 NOTE — Progress Notes (Signed)
[facility]  ROUNDING NOTE   Subjective:   Todd Brown is a 62 year old male with past medical conditions including COPD, hypertension, DVT, schizophrenia, and end-stage renal disease on hemodialysis.  Patient presents to the emergency department with lethargy.   Patient is known to our practice and receives outpatient dialysis treatments at [facility] on a MWF schedule, supervised by Dr. Cherylann Ratel.   Patient seen and evaluated during dialysis   HEMODIALYSIS FLOWSHEET:  Blood Flow Rate (mL/min): 400 mL/min Arterial Pressure (mmHg): -160 mmHg Venous Pressure (mmHg): 230 mmHg TMP (mmHg): 4 mmHg Ultrafiltration Rate (mL/min): 805 mL/min Dialysate Flow Rate (mL/min): 300 ml/min  No complaints at this time Alert and oriented  Objective:  Vital signs in last 24 hours:  Temp:  [97.8 F (36.6 C)-98.8 F (37.1 C)] 98.4 F (36.9 C) (04/22 0820) Pulse Rate:  [50-63] 54 (04/22 1100) Resp:  [9-18] 15 (04/22 1100) BP: (138-196)/(81-98) 192/86 (04/22 1100) SpO2:  [96 %-100 %] 99 % (04/22 1100) Weight:  [77.4 kg] 77.4 kg (04/22 0835)  Weight change:  Filed Weights   09/18/22 0958 09/18/22 1500 09/21/22 0835  Weight: 77.5 kg 74.6 kg 77.4 kg    Intake/Output: I/O last 3 completed shifts: In: 1000 [P.O.:900; Other:100] Out: 0    Intake/Output this shift:  No intake/output data recorded.  Physical Exam: General: NAD  Head: Normocephalic, atraumatic. Moist oral mucosal membranes  Eyes: Anicteric  Lungs:  Diminished in bases, normal effort, [place] O2  Heart: Regular rate and rhythm  Abdomen:  Soft, nontender  Extremities:  No peripheral edema.  Neurologic: Alert and oriented  Skin: No lesions  Access: Lt avf    Basic Metabolic Panel: Recent Labs  Lab 09/14/22 1910 09/15/22 1123 09/17/22 0448 09/18/22 1120 09/21/22 0448  NA 138 133* 136 134* 133*  K 4.5 4.7 3.9 3.9 5.3*  CL 95* 94* 96* 96* 97*  CO2 29 27 29 29 23   GLUCOSE 80 98 89 151* 73  BUN  34* 44* 29* 29* 54*  CREATININE 6.35* 7.30* 5.16* 4.74* 7.63*  CALCIUM 9.2 9.1 8.7* 8.4* 9.3  PHOS  --  3.7  --  2.5  --      Liver Function Tests: Recent Labs  Lab 09/14/22 1910 09/15/22 1123 09/18/22 1120  AST 31  --   --   ALT 17  --   --   ALKPHOS 102  --   --   BILITOT 1.0  --   --   PROT 7.3  --   --   ALBUMIN 3.7 2.9* 2.7*    No results for input(s): "LIPASE", "AMYLASE" in the last 168 hours. No results for input(s): "AMMONIA" in the last 168 hours.  CBC: Recent Labs  Lab 09/14/22 1910 09/15/22 0752 09/17/22 0448 09/18/22 1120 09/21/22 0448  WBC 12.1* 12.9* 5.1 3.7* 5.0  NEUTROABS 10.9*  --   --   --   --   HGB 13.2 13.3 11.6* 10.7* 11.8*  HCT 40.0 40.2 35.2* 32.4* 35.6*  MCV 101.8* 101.8* 101.7* 100.9* 99.7  PLT 183 186 112* 154 190     Cardiac Enzymes: No results for input(s): "CKTOTAL", "CKMB", "CKMBINDEX", "TROPONINI" in the last 168 hours.  BNP: Invalid input(s): "POCBNP"  CBG: No results for input(s): "GLUCAP" in the last 168 hours.  Microbiology: Results for orders placed or performed during the hospital encounter of 09/14/22  Resp panel by RT-PCR (RSV, Flu A&B, Covid) Anterior Nasal Swab     Status: None  Collection Time: 09/14/22  7:10 PM   Specimen: Anterior Nasal Swab  Result Value Ref Range Status   SARS Coronavirus 2 by RT PCR NEGATIVE NEGATIVE Final    Comment: (NOTE) SARS-CoV-2 target nucleic acids are NOT DETECTED.  The SARS-CoV-2 RNA is generally detectable in upper respiratory specimens during the acute phase of infection. The lowest concentration of SARS-CoV-2 viral copies this assay can detect is 138 copies/mL. A negative result does not preclude SARS-Cov-2 infection and should not be used as the sole basis for treatment or other patient management decisions. A negative result may occur with  improper specimen collection/handling, submission of specimen other than nasopharyngeal swab, presence of viral mutation(s) within  the areas targeted by this assay, and inadequate number of viral copies(<138 copies/mL). A negative result must be combined with clinical observations, patient history, and epidemiological information. The expected result is Negative.  Fact Sheet for Patients:  BloggerCourse.com  Fact Sheet for Healthcare Providers:  SeriousBroker.it  This test is no t yet approved or cleared by the [place] FDA and  has been authorized for detection and/or diagnosis of SARS-CoV-2 by FDA under an Emergency Use Authorization (EUA). This EUA will remain  in effect (meaning this test can be used) for the duration of the COVID-19 declaration under Section 564(b)(1) of the Act, 21 U.S.C.section 360bbb-3(b)(1), unless the authorization is terminated  or revoked sooner.       Influenza A by PCR NEGATIVE NEGATIVE Final   Influenza B by PCR NEGATIVE NEGATIVE Final    Comment: (NOTE) The Xpert Xpress SARS-CoV-2/FLU/RSV plus assay is intended as an aid in the diagnosis of influenza from Nasopharyngeal swab specimens and should not be used as a sole basis for treatment. Nasal washings and aspirates are unacceptable for Xpert Xpress SARS-CoV-2/FLU/RSV testing.  Fact Sheet for Patients: BloggerCourse.com  Fact Sheet for Healthcare Providers: SeriousBroker.it  This test is not yet approved or cleared by the [place] FDA and has been authorized for detection and/or diagnosis of SARS-CoV-2 by FDA under an Emergency Use Authorization (EUA). This EUA will remain in effect (meaning this test can be used) for the duration of the COVID-19 declaration under Section 564(b)(1) of the Act, 21 U.S.C. section 360bbb-3(b)(1), unless the authorization is terminated or revoked.     Resp Syncytial Virus by PCR NEGATIVE NEGATIVE Final    Comment: (NOTE) Fact Sheet for  Patients: BloggerCourse.com  Fact Sheet for Healthcare Providers: SeriousBroker.it  This test is not yet approved or cleared by the [place] FDA and has been authorized for detection and/or diagnosis of SARS-CoV-2 by FDA under an Emergency Use Authorization (EUA). This EUA will remain in effect (meaning this test can be used) for the duration of the COVID-19 declaration under Section 564(b)(1) of the Act, 21 U.S.C. section 360bbb-3(b)(1), unless the authorization is terminated or revoked.  Performed at [facility], [address], [place], [place] [address]   Blood Culture (routine x 2)     Status: Abnormal   Collection Time: 09/14/22  7:10 PM   Specimen: BLOOD  Result Value Ref Range Status   Specimen Description   Final    BLOOD BLOOD RIGHT WRIST Performed at [facility], [address], [place], [place] [address]    Special Requests   Final    BOTTLES DRAWN AEROBIC AND ANAEROBIC Blood Culture results may not be optimal due to an inadequate volume of blood received in culture bottles Performed at [facility], [address] [place],  [place] [address]    Culture  Setup Time   Final    GRAM POSITIVE COCCI IN BOTH AEROBIC AND ANAEROBIC BOTTLES CRITICAL RESULT CALLED TO, READ BACK BY AND VERIFIED WITH: Albertine Grates 09/15/22 0715 MW Performed at [facility], [address] [place], [place] [address]    Culture STREPTOCOCCUS GALLOLYTICUS (A)  Final   Report Status 09/17/2022 FINAL  Final   Organism ID, Bacteria STREPTOCOCCUS GALLOLYTICUS  Final      Susceptibility   Streptococcus gallolyticus - MIC*    PENICILLIN 0.12 SENSITIVE Sensitive     CEFTRIAXONE <=0.12 SENSITIVE Sensitive     ERYTHROMYCIN >=8 RESISTANT Resistant     LEVOFLOXACIN 2 SENSITIVE Sensitive     VANCOMYCIN 0.5 SENSITIVE Sensitive     * STREPTOCOCCUS GALLOLYTICUS  Blood Culture ID Panel (Reflexed)     Status:  Abnormal   Collection Time: 09/14/22  7:10 PM  Result Value Ref Range Status   Enterococcus faecalis NOT DETECTED NOT DETECTED Final   Enterococcus Faecium NOT DETECTED NOT DETECTED Final   Listeria monocytogenes NOT DETECTED NOT DETECTED Final   Staphylococcus species NOT DETECTED NOT DETECTED Final   Staphylococcus aureus (BCID) NOT DETECTED NOT DETECTED Final   Staphylococcus epidermidis NOT DETECTED NOT DETECTED Final   Staphylococcus lugdunensis NOT DETECTED NOT DETECTED Final   Streptococcus species DETECTED (A) NOT DETECTED Final    Comment: Not Enterococcus species, Streptococcus agalactiae, Streptococcus pyogenes, or Streptococcus pneumoniae. CRITICAL RESULT CALLED TO, READ BACK BY AND VERIFIED WITH: TRAY GREENWOOD 09/15/22 0715 MW    Streptococcus agalactiae NOT DETECTED NOT DETECTED Final   Streptococcus pneumoniae NOT DETECTED NOT DETECTED Final   Streptococcus pyogenes NOT DETECTED NOT DETECTED Final   A.calcoaceticus-baumannii NOT DETECTED NOT DETECTED Final   Bacteroides fragilis NOT DETECTED NOT DETECTED Final   Enterobacterales NOT DETECTED NOT DETECTED Final   Enterobacter cloacae complex NOT DETECTED NOT DETECTED Final   Escherichia coli NOT DETECTED NOT DETECTED Final   Klebsiella aerogenes NOT DETECTED NOT DETECTED Final   Klebsiella oxytoca NOT DETECTED NOT DETECTED Final   Klebsiella pneumoniae NOT DETECTED NOT DETECTED Final   Proteus species NOT DETECTED NOT DETECTED Final   Salmonella species NOT DETECTED NOT DETECTED Final   Serratia marcescens NOT DETECTED NOT DETECTED Final   Haemophilus influenzae NOT DETECTED NOT DETECTED Final   Neisseria meningitidis NOT DETECTED NOT DETECTED Final   Pseudomonas aeruginosa NOT DETECTED NOT DETECTED Final   Stenotrophomonas maltophilia NOT DETECTED NOT DETECTED Final   Candida albicans NOT DETECTED NOT DETECTED Final   Candida auris NOT DETECTED NOT DETECTED Final   Candida glabrata NOT DETECTED NOT DETECTED Final    Candida krusei NOT DETECTED NOT DETECTED Final   Candida parapsilosis NOT DETECTED NOT DETECTED Final   Candida tropicalis NOT DETECTED NOT DETECTED Final   Cryptococcus neoformans/gattii NOT DETECTED NOT DETECTED Final    Comment: Performed at [facility], [address] [place], [place] [address]  Blood Culture (routine x 2)     Status: Abnormal   Collection Time: 09/14/22  7:15 PM   Specimen: BLOOD  Result Value Ref Range Status   Specimen Description   Final    BLOOD RIGHT ANTECUBITAL Performed at [facility], [address] [place], [place] [address]    Special Requests   Final    BOTTLES DRAWN AEROBIC AND ANAEROBIC Blood Culture results may not be optimal due to an inadequate volume of blood received in culture bottles Performed at [facility], [address] [place], [place] [address]    Culture  Setup Time   Final    GRAM POSITIVE COCCI IN BOTH AEROBIC AND ANAEROBIC BOTTLES CRITICAL RESULT CALLED TO, READ BACK BY AND VERIFIED WITH: TRAY GREENWOOD 09/15/22 0715 MW CRITICAL VALUE NOTED.  VALUE IS CONSISTENT WITH PREVIOUSLY REPORTED AND CALLED VALUE.    Culture (A)  Final    STREPTOCOCCUS GALLOLYTICUS SUSCEPTIBILITIES PERFORMED ON PREVIOUS CULTURE WITHIN THE LAST 5 DAYS. Performed at [facility], [address] [place] [place], [place] [address]    Report Status 09/17/2022 FINAL  Final  Culture, blood (Routine X 2) w Reflex to ID Panel     Status: None (Preliminary result)   Collection Time: 09/17/22  8:49 AM   Specimen: BLOOD  Result Value Ref Range Status   Specimen Description BLOOD RIGHT HAND  Final   Special Requests   Final    BOTTLES DRAWN AEROBIC ONLY Blood Culture adequate volume   Culture   Final    NO GROWTH 3 DAYS Performed at [facility], [address] [place], [place] [address]    Report Status PENDING  Incomplete  Culture, blood (Routine X 2) w Reflex to ID Panel     Status: None (Preliminary result)    Collection Time: 09/17/22  8:49 AM   Specimen: BLOOD  Result Value Ref Range Status   Specimen Description BLOOD LEFT HAND  Final   Special Requests   Final    BOTTLES DRAWN AEROBIC AND ANAEROBIC Blood Culture results may not be optimal due to an excessive volume of blood received in culture bottles   Culture   Final    NO GROWTH 3 DAYS Performed at [facility], [address] [place], [place] [address]    Report Status PENDING  Incomplete  MRSA Next Gen by PCR, Nasal     Status: None   Collection Time: 09/18/22 10:51 PM   Specimen: Nasal Mucosa; Nasal Swab  Result Value Ref Range Status   MRSA by PCR Next Gen NOT DETECTED NOT DETECTED Final    Comment: (NOTE) The GeneXpert MRSA Assay (FDA approved for NASAL specimens only), is one component of a comprehensive MRSA colonization surveillance program. It is not intended to diagnose MRSA infection nor to guide or monitor treatment for MRSA infections. Test performance is not FDA approved in patients less than 6 years old. Performed at [facility], [address] [place], [place] [address]     Coagulation Studies: No results for input(s): "LABPROT", "INR" in the last 72 hours.   Urinalysis: No results for input(s): "COLORURINE", "LABSPEC", "PHURINE", "GLUCOSEU", "HGBUR", "BILIRUBINUR", "KETONESUR", "PROTEINUR", "UROBILINOGEN", "NITRITE", "LEUKOCYTESUR" in the last 72 hours.  Invalid input(s): "APPERANCEUR"    Imaging: No results found.   Medications:    cefTRIAXone (ROCEPHIN)  IV 2 g (09/20/22 0839)    amLODipine  10 mg Oral q morning   calcium acetate  1,334 mg Oral TID WC   carbamazepine  1,000 mg Oral q morning   cholecalciferol  1,000 Units Oral Daily   cloNIDine  0.2 mg Transdermal Weekly   ferrous sulfate  325 mg Oral Daily   fluPHENAZine  2.5 mg Oral Daily   fluticasone furoate-vilanterol  1 puff Inhalation Daily   heparin  5,000 Units Subcutaneous Q8H   hydrALAZINE  10 mg Oral Q8H    isosorbide mononitrate  60 mg Oral BID   levothyroxine  50 mcg Oral Q0600   multivitamin  1 tablet  Oral QHS   pantoprazole  40 mg Oral Daily   trihexyphenidyl  2 mg Oral BID   zinc sulfate  220 mg Oral Daily   acetaminophen **OR** acetaminophen, albuterol, HYDROcodone-acetaminophen, morphine injection, ondansetron **OR** ondansetron (ZOFRAN) IV, polyethylene glycol  Assessment/ Plan:  Mr. DAWSYN RAMSARAN is a 62 y.o.  male is a 62 year old male with past medical conditions including COPD, hypertension, DVT, schizophrenia, and end-stage renal disease on hemodialysis.  Patient presents to the emergency department with lethargy  [facility] D[facility] /MWF/left aVF   Fluid volume overload with end stage renal disease on hemodialysis. CT chest abd pelvis show fluid overload with mild pulmonary edema and pleural effusions.    Receiving dialysis today, UF 2L as tolerated. Due to scheduled procedure, will end treatment early. Next treatment scheduled for Wednesday.   2. Anemia of chronic kidney disease Lab Results  Component Value Date   HGB 11.8 (L) 09/21/2022    Hemoglobin at goal.  No need for ESA's at this time.  3. Secondary Hyperparathyroidism: with outpatient labs: PTH 459, phosphorus 3.4, calcium 9.7 on 06/08/22 .   Lab Results  Component Value Date   PTH 194 (H) 05/11/2017   CALCIUM 9.3 09/21/2022   CAION 1.19 03/27/2022   PHOS 2.5 09/18/2022  Bone minerals acceptable. Continue cholecalciferol and calcium acetate.  4.  Hypertension with chronic kidney disease.  Home regimen includes amlodipine, clonidine, hydralazine, isosorbide, doxazosin and minoxidil.  All currently held at this time.  Blood pressure 196/87 during dialysis.   LOS: 7   4/22/202411:18 AM

## 2022-09-21 NOTE — Procedures (Signed)
Transesophageal Echocardiogram :  Indication: bacteremia Requesting/ordering  physician: ID team  Procedure: 10ml of lidocaine were given orally to provide local anesthesia to the oropharynx. The patient was positioned supine on the left side, bite block provided. The patient was moderately sedated with the doses of versed and fentanyl as detailed below.  Using digital technique an omniplane probe was advanced into the esophagus without incident.   Moderate sedation: 1. Sedation used:  Fentanyl: 2. Time administered:   1250  Time when patient started recovery:1:06 3. I was face to face during this time 16 mins  See report in EPIC  for complete details: In brief, imaging revealed normal LV function with no RWMAs and no mural apical thrombus. Estimated ejection fraction was 50-55%.  Right sided cardiac chambers were normal with no evidence of pulmonary hypertension.  There is no evidence for endocarditis  Imaging of the septum showed no ASD or VSD 2D and color flow confirmed no PFO  The LA was well visualized in orthogonal views.  There was no spontaneous contrast and no thrombus in the LA and LA appendage   Conclusion No evidence for endocarditis  Debbe Odea 09/21/2022 1:15 PM

## 2022-09-21 NOTE — Progress Notes (Signed)
  Received patient in bed to unit.   Informed consent signed and in chart.    TX duration:2.55hrs     Transported to specials for procedure  Hand-off given to patient's floor nurse   no c/o and no distress noted    Access used: LAVF Access issues: none   Total UF removed: 2.0kg Medication(s) given: 5/325mg  norco  Post HD VS: 194/91 Post HD weight: 75.6kg     Lynann Beaver  [facility]

## 2022-09-21 NOTE — Progress Notes (Signed)
PT Cancellation Note  Patient Details Name: Todd Brown MRN: [id number] DOB: Nov 27, 1960   Cancelled Treatment:    Reason Eval/Treat Not Completed: Patient at procedure or test/unavailable. PT to continue with attempts.   Donna Basel, PT, MPT  Ina Homes 09/21/2022, 8:27 AM

## 2022-09-21 NOTE — Progress Notes (Signed)
   [facility] has been requested to perform a transesophageal echocardiogram on Carlton Adam Lundahl for bacteremia.  After careful review of history and examination, the risks and benefits of transesophageal echocardiogram have been explained to the patient's legal guardian, Candace Gobble with [facility] social services, including risks of esophageal damage, perforation (1:10,000 risk), bleeding, pharyngeal hematoma as well as other potential complications associated with anesthesia including aspiration, arrhythmia, respiratory failure and death. Alternatives to treatment were discussed, questions were answered.  Legal guardian is willing to proceed.  Clinical staff will contact legal guardian to obtain verbal consent x 2.  Eula Listen, PA-C 09/21/2022 9:11 AM

## 2022-09-21 NOTE — Progress Notes (Signed)
Patient returned from vascular lab 

## 2022-09-21 NOTE — Progress Notes (Signed)
*  PRELIMINARY RESULTS* Echocardiogram Echocardiogram Transesophageal has been performed.  Cristela Blue 09/21/2022, 1:21 PM

## 2022-09-21 NOTE — Progress Notes (Signed)
OT Cancellation Note  Patient Details Name: ROGER KETTLES MRN: [id number] DOB: [birth year]   Cancelled Treatment:    Reason Eval/Treat Not Completed: Patient at procedure or test/ unavailable. OT continues to follow pt for therapy services. Upon arrival to pt room this AM, pt noted to be OTF for procedure. Will hold at this time and re-attempt at a later date/time as available, and pt medically appropriate for OT services.   Rockney Ghee, M.S., OTR/L 09/21/22, 8:46 AM

## 2022-09-21 NOTE — Progress Notes (Signed)
PROGRESS NOTE    Todd Brown   ZOX:[id number] DOB: January 03, 1961  DOA: 09/14/2022 Date of Service: 09/21/22 PCP: Smiley Houseman, NP     Brief Narrative / Hospital Course:  Todd Brown is a 62 y.o. 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  Patient was in his usual state of health earlier and went to and completed his dialysis session but on arrival home he was lethargic.  On arrival of EMS he was saturating in the 70s on room air and was placed on NRB for transport. Of note: bacteremia secondary to strep gallolyticus in 01/24. Suspected source was from the PermCath (PermCath was removed during that hospitalization). Standard ECHO was obtained which did not show any vegetation.  ID was consulted and they requested TEE and colonscopy, however, the pt did not agree to have these procedures. Pt was continued on IV cefazolin 2g MWF with dialysis for strep gallolyticus bacteremia (END DATE Jul 20 2022).   04/15: ED course and data review: Tmax 104.2 with pulse 107 respirations 23 and O2 sat initially 89% on NRB improving to 94% on HFNC at 6 L.  BP was 154/86 on arrival.  VBG on HFNC showed pH 7.5 with pCO2 40.  Labs otherwise significant for WBC 12,100, lactic acid 0.9 and procalcitonin 0.72.  Respiratory viral panel negative for COVID flu and RSV.  Troponin 81. Patient started on cefepime vancomycin and Flagyl for sepsis of unknown source. Hospitalist consulted for admission.  04/16: Blood culture revealed strep species. Discontinued cefepime, vancomycin and Flagyl and place patient on Rocephin 2 g IV daily. Consulted ID - recs for TEE and  colonoscopy, AV graft evauation. Patient did not receive IV fluid resuscitation due to fluid overload. Removed 2L in HD 04/17: dialysis today - examined afterward. Pt has no complaints other than wants something to eat. Removed another 2L in HD.  04/18: cardiology consulted for TEE - await improvement in volume status, TEE will have to be w/ [facility] not [facility] d/t staffing. Repeating BCx. Will d/w guardian re: consent for TEE - she states he was amenable to TEE but did not want colonoscopy.  04/19-04/21: stable thru weekend, planning TEE Monday 04/22 04/22: TEE pending.   Consultants:  Nephrology  Infectious Disease  Cardiology   Procedures: None       ASSESSMENT & PLAN:   Principal Problem:   Acute on chronic respiratory failure with hypoxia Active Problems:   Severe sepsis versus SIRS   CAP (community acquired pneumonia)   Anasarca (mild pulmonary edema, pleural and pericardial effusion, ascites on CT)   Acute metabolic encephalopathy   ESRD on dialysis   Essential hypertension   COPD (chronic obstructive pulmonary disease)   Schizophrenia   Essential hypertension, benign   Bacteremia   Acute on chronic respiratory failure  Severe sepsis  Community-acquired pneumonia  Streptococcus bacteremia.   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  Organ dysfunction of respiratory failure and  altered mental status. CT chest abdomen and pelvis consistent with anasarca.  Cannot rule out superimposed pulmonary infection Blood culture revealed strep species Continue Rocephin 2 g IV daily  Consult ID --> will need TEE and colonoscopy, evaluate graft  Patient did not receive IV fluid resuscitation due to fluid overload, conitnue w/ HD Consent w/ guardian for TEE planned for today   Goals of care Patient does not demonstrate adequate understanding of his medical  illness(es), he can not make an informed decision about complex treatments - consent or refusal.  Given frailty and multiple comorbidities, he is less likely to survive resuscitation measures in the event of a cardiopulmonary arrest but I am not convinced at this time that resuscitation would be utterly futile, either. He has no terminal illness at this time which would recommend strongly for DNR/DNI status, especially since he has not voiced that he would NOT desire CPR or life support measures  guardian will have to give consent/refusal for TEE or other procedures  Consider palliative care consult pending clinical course   Acute on chronic respiratory failure with hypoxia - improving Etiology acute on chronic diastolic CHF and CAP  Continue O2 to maintain sats over 92% and wean as tolerated  Acute metabolic encephalopathy - resolved Secondary to sepsis from Streptococcus bacteremia Patient is oriented to person and place but not to time Monitor patient closely    Acute on chronic diastolic dysfunction CHF Anasarca  EF 06/2022 showing EF 60 to 65% with grade 1 diastolic dysfunction CT with findings of volume overload, mild pulmonary edema with pleural effusions, small volume pericardial effusion and anasarca and small volume fluid ascites. Net IO Since Admission: -1,570 mL [09/19/22 1259] Nephrology consult for management of fluid status   ESRD on dialysis Nephrology consult for renal replacement therapy   Essential hypertension continue home meds    COPD (chronic obstructive pulmonary disease) Continue home inhalers DuoNebs as needed   Schizophrenia Continue Prolixin and carbamazepine  Vision impairment Precautions for fall   DVT prophylaxis: heparin Pertinent IV fluids/nutrition: no continuous IV fluids, cardiac diet  Central lines / invasive devices: none  Code Status: FULL CODE ACP documentation reviewed: none on file   Current Admission Status: inpatient  TOC needs /  Dispo plan: HH PT Barriers to discharge / significant pending items: bacteremia, pend repeat cultures, ID recs, fluid overload, TEE, IV abx, will be here several more days pending results     Todd Brown - guardian [phone] = fax            Subjective / Brief ROS:  Patient reports no concerns  Denies CP/SOB.  Pain controlled.    Family Communication: cardiology communicated today w/ legal guardian, no other updates to give her at this time.     Objective Findings:  Vitals:   09/21/22 1000 09/21/22 1030 09/21/22 1100 09/21/22 1130  BP: (!) 192/83 (!) 196/90 (!) 192/86 (!) 196/87  Pulse: (!) 54 (!) 51 (!) 54 (!) 50  Resp: 12 14 15  (!) 9  Temp:      TempSrc:      SpO2: 99% 97% 99% 99%  Weight:      Height:        Intake/Output Summary (Last 24 hours) at 09/21/2022 1137 Last data filed at 09/20/2022 2040 Gross per 24 hour  Intake 660 ml  Output --  Net 660 ml   Filed Weights   09/18/22 0958 09/18/22 1500 09/21/22 0835  Weight: 77.5 kg 74.6 kg 77.4 kg    Examination:  Physical Exam Constitutional:      General: He is not in acute distress. Cardiovascular:     Rate and Rhythm: Normal rate  and regular rhythm.  Pulmonary:     Effort: Pulmonary effort is normal.     Breath sounds: Normal breath sounds.  Abdominal:     General: Abdomen is flat.     Palpations: Abdomen is soft.  Musculoskeletal:     Right lower leg: No edema.     Left lower leg: No edema.  Skin:    General: Skin is warm and dry.  Neurological:     General: No focal deficit present.     Mental Status: He is alert. Mental status is at baseline.  Psychiatric:        Behavior: Behavior normal.          Scheduled Medications:   amLODipine  10 mg Oral q morning   calcium acetate  1,334 mg Oral TID WC   carbamazepine  1,000 mg Oral q morning   cholecalciferol  1,000 Units Oral Daily   cloNIDine  0.2 mg Transdermal Weekly   ferrous sulfate  325 mg Oral Daily   fluPHENAZine   2.5 mg Oral Daily   fluticasone furoate-vilanterol  1 puff Inhalation Daily   heparin  5,000 Units Subcutaneous Q8H   hydrALAZINE  10 mg Oral Q8H   isosorbide mononitrate  60 mg Oral BID   levothyroxine  50 mcg Oral Q0600   multivitamin  1 tablet Oral QHS   pantoprazole  40 mg Oral Daily   trihexyphenidyl  2 mg Oral BID   zinc sulfate  220 mg Oral Daily    Continuous Infusions:  sodium chloride     cefTRIAXone (ROCEPHIN)  IV 2 g (09/20/22 0839)    PRN Medications:  acetaminophen **OR** acetaminophen, albuterol, HYDROcodone-acetaminophen, morphine injection, ondansetron **OR** ondansetron (ZOFRAN) IV, polyethylene glycol  Antimicrobials from admission:  Anti-infectives (From admission, onward)    Start     Dose/Rate Route Frequency Ordered Stop   09/16/22 2200  vancomycin (VANCOREADY) IVPB 750 mg/150 mL  Status:  Discontinued        750 mg 150 mL/hr over 60 Minutes Intravenous Every M-W-F (Hemodialysis) 09/15/22 0031 09/15/22 1329   09/15/22 1000  ceFEPIme (MAXIPIME) 2 g in sodium chloride 0.9 % 100 mL IVPB  Status:  Discontinued        2 g 200 mL/hr over 30 Minutes Intravenous Every 12 hours 09/15/22 0031 09/15/22 0734   09/15/22 0800  metroNIDAZOLE (FLAGYL) IVPB 500 mg  Status:  Discontinued        500 mg 100 mL/hr over 60 Minutes Intravenous Every 12 hours 09/14/22 2248 09/15/22 1329   09/15/22 0800  cefTRIAXone (ROCEPHIN) 2 g in sodium chloride 0.9 % 100 mL IVPB        2 g 200 mL/hr over 30 Minutes Intravenous Every 24 hours 09/15/22 0734     09/15/22 0000  vancomycin (VANCOREADY) IVPB 750 mg/150 mL        750 mg 150 mL/hr over 60 Minutes Intravenous  Once 09/14/22 2258 09/15/22 0200   09/14/22 1915  ceFEPIme (MAXIPIME) 2 g in sodium chloride 0.9 % 100 mL IVPB        2 g 200 mL/hr over 30 Minutes Intravenous  Once 09/14/22 1909 09/14/22 1949   09/14/22 1915  metroNIDAZOLE (FLAGYL) IVPB 500 mg        500 mg 100 mL/hr over 60 Minutes Intravenous  Once 09/14/22 1909  09/14/22 2052   09/14/22 1915  vancomycin (VANCOCIN) IVPB 1000 mg/200 mL premix        1,000 mg 200 mL/hr over  60 Minutes Intravenous  Once 09/14/22 1909 09/14/22 2210           Data Reviewed:  I have personally reviewed the following...  CBC: Recent Labs  Lab 09/14/22 1910 09/15/22 0752 09/17/22 0448 09/18/22 1120 09/21/22 0448  WBC 12.1* 12.9* 5.1 3.7* 5.0  NEUTROABS 10.9*  --   --   --   --   HGB 13.2 13.3 11.6* 10.7* 11.8*  HCT 40.0 40.2 35.2* 32.4* 35.6*  MCV 101.8* 101.8* 101.7* 100.9* 99.7  PLT 183 186 112* 154 190   Basic Metabolic Panel: Recent Labs  Lab 09/14/22 1910 09/15/22 1123 09/17/22 0448 09/18/22 1120 09/21/22 0448  NA 138 133* 136 134* 133*  K 4.5 4.7 3.9 3.9 5.3*  CL 95* 94* 96* 96* 97*  CO2 29 27 29 29 23   GLUCOSE 80 98 89 151* 73  BUN 34* 44* 29* 29* 54*  CREATININE 6.35* 7.30* 5.16* 4.74* 7.63*  CALCIUM 9.2 9.1 8.7* 8.4* 9.3  PHOS  --  3.7  --  2.5  --    GFR: Estimated Creatinine Clearance: 10.8 mL/min (A) (by C-G formula based on SCr of 7.63 mg/dL (H)). Liver Function Tests: Recent Labs  Lab 09/14/22 1910 09/15/22 1123 09/18/22 1120  AST 31  --   --   ALT 17  --   --   ALKPHOS 102  --   --   BILITOT 1.0  --   --   PROT 7.3  --   --   ALBUMIN 3.7 2.9* 2.7*   No results for input(s): "LIPASE", "AMYLASE" in the last 168 hours. No results for input(s): "AMMONIA" in the last 168 hours. Coagulation Profile: Recent Labs  Lab 09/14/22 1910  INR 1.1   Cardiac Enzymes: No results for input(s): "CKTOTAL", "CKMB", "CKMBINDEX", "TROPONINI" in the last 168 hours. BNP (last 3 results) No results for input(s): "PROBNP" in the last 8760 hours. HbA1C: No results for input(s): "HGBA1C" in the last 72 hours. CBG: No results for input(s): "GLUCAP" in the last 168 hours. Lipid Profile: No results for input(s): "CHOL", "HDL", "LDLCALC", "TRIG", "CHOLHDL", "LDLDIRECT" in the last 72 hours. Thyroid Function Tests: No results for  input(s): "TSH", "T4TOTAL", "FREET4", "T3FREE", "THYROIDAB" in the last 72 hours. Anemia Panel: No results for input(s): "VITAMINB12", "FOLATE", "FERRITIN", "TIBC", "IRON", "RETICCTPCT" in the last 72 hours. Most Recent Urinalysis On File:     Component Value Date/Time   COLORURINE YELLOW (A) 05/17/2015 0012   APPEARANCEUR HAZY (A) 05/17/2015 0012   LABSPEC 1.009 05/17/2015 0012   PHURINE 7.0 05/17/2015 0012   GLUCOSEU NEGATIVE 05/17/2015 0012   HGBUR 1+ (A) 05/17/2015 0012   BILIRUBINUR NEGATIVE 05/17/2015 0012   KETONESUR NEGATIVE 05/17/2015 0012   PROTEINUR 30 (A) 05/17/2015 0012   NITRITE NEGATIVE 05/17/2015 0012   LEUKOCYTESUR 3+ (A) 05/17/2015 0012   Sepsis Labs: @LABRCNTIP (procalcitonin:4,lacticidven:4) Microbiology: Recent Results (from the past 240 hour(s))  Resp panel by RT-PCR (RSV, Flu A&B, Covid) Anterior Nasal Swab     Status: None   Collection Time: 09/14/22  7:10 PM   Specimen: Anterior Nasal Swab  Result Value Ref Range Status   SARS Coronavirus 2 by RT PCR NEGATIVE NEGATIVE Final    Comment: (NOTE) SARS-CoV-2 target nucleic acids are NOT DETECTED.  The SARS-CoV-2 RNA is generally detectable in upper respiratory specimens during the acute phase of infection. The lowest concentration of SARS-CoV-2 viral copies this assay can detect is 138 copies/mL. A negative result does not preclude SARS-Cov-2 infection and  should not be used as the sole basis for treatment or other patient management decisions. A negative result may occur with  improper specimen collection/handling, submission of specimen other than nasopharyngeal swab, presence of viral mutation(s) within the areas targeted by this assay, and inadequate number of viral copies(<138 copies/mL). A negative result must be combined with clinical observations, patient history, and epidemiological information. The expected result is Negative.  Fact Sheet for Patients:   BloggerCourse.com  Fact Sheet for Healthcare Providers:  SeriousBroker.it  This test is no t yet approved or cleared by the [place] FDA and  has been authorized for detection and/or diagnosis of SARS-CoV-2 by FDA under an Emergency Use Authorization (EUA). This EUA will remain  in effect (meaning this test can be used) for the duration of the COVID-19 declaration under Section 564(b)(1) of the Act, 21 U.S.C.section 360bbb-3(b)(1), unless the authorization is terminated  or revoked sooner.       Influenza A by PCR NEGATIVE NEGATIVE Final   Influenza B by PCR NEGATIVE NEGATIVE Final    Comment: (NOTE) The Xpert Xpress SARS-CoV-2/FLU/RSV plus assay is intended as an aid in the diagnosis of influenza from Nasopharyngeal swab specimens and should not be used as a sole basis for treatment. Nasal washings and aspirates are unacceptable for Xpert Xpress SARS-CoV-2/FLU/RSV testing.  Fact Sheet for Patients: BloggerCourse.com  Fact Sheet for Healthcare Providers: SeriousBroker.it  This test is not yet approved or cleared by the [place] FDA and has been authorized for detection and/or diagnosis of SARS-CoV-2 by FDA under an Emergency Use Authorization (EUA). This EUA will remain in effect (meaning this test can be used) for the duration of the COVID-19 declaration under Section 564(b)(1) of the Act, 21 U.S.C. section 360bbb-3(b)(1), unless the authorization is terminated or revoked.     Resp Syncytial Virus by PCR NEGATIVE NEGATIVE Final    Comment: (NOTE) Fact Sheet for Patients: BloggerCourse.com  Fact Sheet for Healthcare Providers: SeriousBroker.it  This test is not yet approved or cleared by the [place] FDA and has been authorized for detection and/or diagnosis of SARS-CoV-2 by FDA under an Emergency Use  Authorization (EUA). This EUA will remain in effect (meaning this test can be used) for the duration of the COVID-19 declaration under Section 564(b)(1) of the Act, 21 U.S.C. section 360bbb-3(b)(1), unless the authorization is terminated or revoked.  Performed at [facility], [address] [place], [place] [address]   Blood Culture (routine x 2)     Status: Abnormal   Collection Time: 09/14/22  7:10 PM   Specimen: BLOOD  Result Value Ref Range Status   Specimen Description   Final    BLOOD BLOOD RIGHT WRIST Performed at [facility], [address], [place], [place] [address]    Special Requests   Final    BOTTLES DRAWN AEROBIC AND ANAEROBIC Blood Culture results may not be optimal due to an inadequate volume of blood received in culture bottles Performed at [facility], [address] [place], [place] [address]    Culture  Setup Time   Final    GRAM POSITIVE COCCI IN BOTH AEROBIC AND ANAEROBIC BOTTLES CRITICAL RESULT CALLED TO, READ BACK BY AND VERIFIED WITH: Albertine Grates 09/15/22 0715 MW Performed at [facility], [address], [place], [place] [address]    Culture STREPTOCOCCUS GALLOLYTICUS (A)  Final   Report Status 09/17/2022 FINAL  Final   Organism ID, Bacteria STREPTOCOCCUS GALLOLYTICUS  Final  Susceptibility   Streptococcus gallolyticus - MIC*    PENICILLIN 0.12 SENSITIVE Sensitive     CEFTRIAXONE <=0.12 SENSITIVE Sensitive     ERYTHROMYCIN >=8 RESISTANT Resistant     LEVOFLOXACIN 2 SENSITIVE Sensitive     VANCOMYCIN 0.5 SENSITIVE Sensitive     * STREPTOCOCCUS GALLOLYTICUS  Blood Culture ID Panel (Reflexed)     Status: Abnormal   Collection Time: 09/14/22  7:10 PM  Result Value Ref Range Status   Enterococcus faecalis NOT DETECTED NOT DETECTED Final   Enterococcus Faecium NOT DETECTED NOT DETECTED Final   Listeria monocytogenes NOT DETECTED NOT DETECTED Final   Staphylococcus species NOT DETECTED NOT DETECTED Final    Staphylococcus aureus (BCID) NOT DETECTED NOT DETECTED Final   Staphylococcus epidermidis NOT DETECTED NOT DETECTED Final   Staphylococcus lugdunensis NOT DETECTED NOT DETECTED Final   Streptococcus species DETECTED (A) NOT DETECTED Final    Comment: Not Enterococcus species, Streptococcus agalactiae, Streptococcus pyogenes, or Streptococcus pneumoniae. CRITICAL RESULT CALLED TO, READ BACK BY AND VERIFIED WITH: TRAY GREENWOOD 09/15/22 0715 MW    Streptococcus agalactiae NOT DETECTED NOT DETECTED Final   Streptococcus pneumoniae NOT DETECTED NOT DETECTED Final   Streptococcus pyogenes NOT DETECTED NOT DETECTED Final   A.calcoaceticus-baumannii NOT DETECTED NOT DETECTED Final   Bacteroides fragilis NOT DETECTED NOT DETECTED Final   Enterobacterales NOT DETECTED NOT DETECTED Final   Enterobacter cloacae complex NOT DETECTED NOT DETECTED Final   Escherichia coli NOT DETECTED NOT DETECTED Final   Klebsiella aerogenes NOT DETECTED NOT DETECTED Final   Klebsiella oxytoca NOT DETECTED NOT DETECTED Final   Klebsiella pneumoniae NOT DETECTED NOT DETECTED Final   Proteus species NOT DETECTED NOT DETECTED Final   Salmonella species NOT DETECTED NOT DETECTED Final   Serratia marcescens NOT DETECTED NOT DETECTED Final   Haemophilus influenzae NOT DETECTED NOT DETECTED Final   Neisseria meningitidis NOT DETECTED NOT DETECTED Final   Pseudomonas aeruginosa NOT DETECTED NOT DETECTED Final   Stenotrophomonas maltophilia NOT DETECTED NOT DETECTED Final   Candida albicans NOT DETECTED NOT DETECTED Final   Candida auris NOT DETECTED NOT DETECTED Final   Candida glabrata NOT DETECTED NOT DETECTED Final   Candida krusei NOT DETECTED NOT DETECTED Final   Candida parapsilosis NOT DETECTED NOT DETECTED Final   Candida tropicalis NOT DETECTED NOT DETECTED Final   Cryptococcus neoformans/gattii NOT DETECTED NOT DETECTED Final    Comment: Performed at [facility], [address] [place], [place]  [address]  Blood Culture (routine x 2)     Status: Abnormal   Collection Time: 09/14/22  7:15 PM   Specimen: BLOOD  Result Value Ref Range Status   Specimen Description   Final    BLOOD RIGHT ANTECUBITAL Performed at [facility], [address] [place], [place] [address]    Special Requests   Final    BOTTLES DRAWN AEROBIC AND ANAEROBIC Blood Culture results may not be optimal due to an inadequate volume of blood received in culture bottles Performed at [facility], [address] [place], [place] [address]    Culture  Setup Time   Final    GRAM POSITIVE COCCI IN BOTH AEROBIC AND ANAEROBIC BOTTLES CRITICAL RESULT CALLED TO, READ BACK BY AND VERIFIED WITH: TRAY GREENWOOD 09/15/22 0715 MW CRITICAL VALUE NOTED.  VALUE IS CONSISTENT WITH PREVIOUSLY REPORTED AND CALLED VALUE.    Culture (A)  Final    STREPTOCOCCUS GALLOLYTICUS SUSCEPTIBILITIES PERFORMED ON PREVIOUS CULTURE WITHIN THE LAST 5 DAYS. Performed at [facility], [address] [place], [place] [address]    Report Status 09/17/2022 FINAL  Final  Culture, blood (Routine X 2) w Reflex to ID Panel     Status: None (Preliminary result)   Collection Time: 09/17/22  8:49 AM   Specimen: BLOOD  Result Value Ref Range Status   Specimen Description BLOOD RIGHT HAND  Final   Special Requests   Final    BOTTLES DRAWN AEROBIC ONLY Blood Culture adequate volume   Culture   Final    NO GROWTH 3 DAYS Performed at [facility], [address], [place], [place] [address]    Report Status PENDING  Incomplete  Culture, blood (Routine X 2) w Reflex to ID Panel     Status: None (Preliminary result)   Collection Time: 09/17/22  8:49 AM   Specimen: BLOOD  Result Value Ref Range Status   Specimen Description BLOOD LEFT HAND  Final   Special Requests   Final    BOTTLES DRAWN AEROBIC AND ANAEROBIC Blood Culture results may not be optimal due to an excessive volume of blood received in culture bottles    Culture   Final    NO GROWTH 3 DAYS Performed at [facility], [address] [place], [place] [address]    Report Status PENDING  Incomplete  MRSA Next Gen by PCR, Nasal     Status: None   Collection Time: 09/18/22 10:51 PM   Specimen: Nasal Mucosa; Nasal Swab  Result Value Ref Range Status   MRSA by PCR Next Gen NOT DETECTED NOT DETECTED Final    Comment: (NOTE) The GeneXpert MRSA Assay (FDA approved for NASAL specimens only), is one component of a comprehensive MRSA colonization surveillance program. It is not intended to diagnose MRSA infection nor to guide or monitor treatment for MRSA infections. Test performance is not FDA approved in patients less than 34 years old. Performed at [facility], [address] [place], [place] [address]       Radiology Studies last 3 days: ECHOCARDIOGRAM COMPLETE  Result Date: 09/17/2022    ECHOCARDIOGRAM REPORT   Patient Name:   Todd Brown Date of Exam: 09/17/2022 Medical Rec #:  [id number]        Height:       71.0 in Accession #:    [id number]       Weight:       175.0 lb Date of Birth:  Feb 28, 1961        BSA:          1.992 m Patient Age:    61 years         BP:           163/89 mmHg Patient Gender: M                HR:           64 bpm. Exam Location:  [facility] Procedure: 2D Echo, Cardiac Doppler and Color Doppler Indications:     Bacteremia  History:         Patient has prior history of Echocardiogram examinations, most                  recent 06/20/2022. Previous Myocardial Infarction, COPD,                  Signs/Symptoms:Bacteremia and Altered Mental Status; Risk  Factors:Hypertension. ESRD on Dialysis.  Sonographer:     Mikki Harbor Referring Phys:  [id number] Lynn Ito Diagnosing Phys: Julien Nordmann MD  Sonographer Comments: Image acquisition challenging due to uncooperative patient. IMPRESSIONS  1. No valve vegetation noted. Leaflets well visualized.  2. Left ventricular ejection fraction,  by estimation, is 50 to 55%. The left ventricle has low normal function. The left ventricle has no regional wall motion abnormalities. There is severe left ventricular hypertrophy. Left ventricular diastolic parameters are consistent with Grade I diastolic dysfunction (impaired relaxation). There is the interventricular septum is flattened in systole and diastole, consistent with right ventricular pressure and volume overload.  3. Right ventricular systolic function is moderately reduced. The right ventricular size is moderately enlarged. Moderately increased right ventricular wall thickness. There is moderately elevated pulmonary artery systolic pressure. The estimated right ventricular systolic pressure is 59.1 mmHg.  4. Left atrial size was severely dilated.  5. Right atrial size was severely dilated.  6. The mitral valve is normal in structure. No evidence of mitral valve regurgitation. No evidence of mitral stenosis.  7. Tricuspid valve regurgitation is moderate to severe.  8. The aortic valve is normal in structure. Aortic valve regurgitation is mild. Aortic valve sclerosis is present, with no evidence of aortic valve stenosis.  9. There is mild dilatation of the aortic root, measuring 41 mm. 10. The inferior vena cava is normal in size with greater than 50% respiratory variability, suggesting right atrial pressure of 3 mmHg. FINDINGS  Left Ventricle: Left ventricular ejection fraction, by estimation, is 50 to 55%. The left ventricle has low normal function. The left ventricle has no regional wall motion abnormalities. The left ventricular internal cavity size was normal in size. There is severe left ventricular hypertrophy. The interventricular septum is flattened in systole and diastole, consistent with right ventricular pressure and volume overload. Left ventricular diastolic parameters are consistent with Grade I diastolic dysfunction (impaired relaxation). Right Ventricle: The right ventricular size is  moderately enlarged. Moderately increased right ventricular wall thickness. Right ventricular systolic function is moderately reduced. There is moderately elevated pulmonary artery systolic pressure. The tricuspid regurgitant velocity is 3.32 m/s, and with an assumed right atrial pressure of 15 mmHg, the estimated right ventricular systolic pressure is 59.1 mmHg. Left Atrium: Left atrial size was severely dilated. Right Atrium: Right atrial size was severely dilated. Pericardium: Trivial pericardial effusion is present. Mitral Valve: The mitral valve is normal in structure. No evidence of mitral valve regurgitation. No evidence of mitral valve stenosis. MV peak gradient, 3.3 mmHg. The mean mitral valve gradient is 1.0 mmHg. There is no evidence of mitral valve vegetation. Tricuspid Valve: The tricuspid valve is normal in structure. Tricuspid valve regurgitation is moderate to severe. No evidence of tricuspid stenosis. There is no evidence of tricuspid valve vegetation. Aortic Valve: The aortic valve is normal in structure. Aortic valve regurgitation is mild. Aortic regurgitation PHT measures 603 msec. Aortic valve sclerosis is present, with no evidence of aortic valve stenosis. Aortic valve mean gradient measures 4.0 mmHg. Aortic valve peak gradient measures 8.1 mmHg. Aortic valve area, by VTI measures 4.39 cm. There is no evidence of aortic valve vegetation. Pulmonic Valve: The pulmonic valve was normal in structure. Pulmonic valve regurgitation is mild to moderate. No evidence of pulmonic stenosis. There is no evidence of pulmonic valve vegetation. Aorta: The aortic root is normal in size and structure. There is mild dilatation of the aortic root, measuring 41 mm. Venous: The inferior vena cava is normal  in size with greater than 50% respiratory variability, suggesting right atrial pressure of 3 mmHg. IAS/Shunts: No atrial level shunt detected by color flow Doppler.  LEFT VENTRICLE PLAX 2D LVIDd:         4.20 cm    Diastology LVIDs:         2.90 cm   LV e' medial:  9.79 cm/s LV PW:         1.70 cm   LV e' lateral: 8.70 cm/s LV IVS:        2.40 cm LVOT diam:     2.50 cm LV SV:         117 LV SV Index:   59 LVOT Area:     4.91 cm  RIGHT VENTRICLE RV Basal diam:  5.35 cm RV Mid diam:    4.50 cm RV S prime:     13.70 cm/s TAPSE (M-mode): 2.4 cm LEFT ATRIUM              Index        RIGHT ATRIUM           Index LA diam:        4.70 cm  2.36 cm/m   RA Area:     34.70 cm LA Vol (A2C):   119.0 ml 59.73 ml/m  RA Volume:   152.00 ml 76.30 ml/m LA Vol (A4C):   85.3 ml  42.82 ml/m LA Biplane Vol: 102.0 ml 51.20 ml/m  AORTIC VALVE                    PULMONIC VALVE AV Area (Vmax):    4.67 cm     PV Vmax:       0.91 m/s AV Area (Vmean):   4.50 cm     PV Peak grad:  3.3 mmHg AV Area (VTI):     4.39 cm AV Vmax:           142.00 cm/s AV Vmean:          85.400 cm/s AV VTI:            0.267 m AV Peak Grad:      8.1 mmHg AV Mean Grad:      4.0 mmHg LVOT Vmax:         135.00 cm/s LVOT Vmean:        78.300 cm/s LVOT VTI:          0.239 m LVOT/AV VTI ratio: 0.90 AI PHT:            603 msec  AORTA Ao Root diam: 4.10 cm Ao Asc diam:  3.20 cm MITRAL VALVE              TRICUSPID VALVE MV Area (PHT): 2.02 cm   TR Peak grad:   44.1 mmHg MV Area VTI:   3.34 cm   TR Vmax:        332.00 cm/s MV Peak grad:  3.3 mmHg MV Mean grad:  1.0 mmHg   SHUNTS MV Vmax:       0.91 m/s   Systemic VTI:  0.24 m MV Vmean:      55.5 cm/s  Systemic Diam: 2.50 cm Julien Nordmann MD Electronically signed by Julien Nordmann MD Signature Date/Time: 09/17/2022/4:38:57 PM    Final              LOS: 7 days        Sunnie Nielsen, DO [facility] 09/21/2022, 11:37 AM  Dictation software may have been used to generate the above note. Typos may occur and escape review in typed/dictated notes. Please contact Dr Lyn Hollingshead directly for clarity if needed.  Staff may message me via secure chat in Epic  but this may not receive an immediate  response,  please page me for urgent matters!  If 7PM-7AM, please contact night coverage [email or web address]

## 2022-09-22 ENCOUNTER — Encounter: Payer: Self-pay | Admitting: Cardiology

## 2022-09-22 DIAGNOSIS — E039 Hypothyroidism, unspecified: Secondary | ICD-10-CM

## 2022-09-22 DIAGNOSIS — N186 End stage renal disease: Secondary | ICD-10-CM | POA: Diagnosis not present

## 2022-09-22 DIAGNOSIS — H5461 Unqualified visual loss, right eye, normal vision left eye: Secondary | ICD-10-CM

## 2022-09-22 DIAGNOSIS — B955 Unspecified streptococcus as the cause of diseases classified elsewhere: Secondary | ICD-10-CM | POA: Diagnosis not present

## 2022-09-22 DIAGNOSIS — I120 Hypertensive chronic kidney disease with stage 5 chronic kidney disease or end stage renal disease: Secondary | ICD-10-CM | POA: Diagnosis not present

## 2022-09-22 DIAGNOSIS — B954 Other streptococcus as the cause of diseases classified elsewhere: Secondary | ICD-10-CM

## 2022-09-22 DIAGNOSIS — G9341 Metabolic encephalopathy: Secondary | ICD-10-CM | POA: Diagnosis not present

## 2022-09-22 DIAGNOSIS — H5462 Unqualified visual loss, left eye, normal vision right eye: Secondary | ICD-10-CM

## 2022-09-22 DIAGNOSIS — F209 Schizophrenia, unspecified: Secondary | ICD-10-CM

## 2022-09-22 DIAGNOSIS — R7881 Bacteremia: Secondary | ICD-10-CM | POA: Diagnosis not present

## 2022-09-22 LAB — CULTURE, BLOOD (ROUTINE X 2)
Culture: NO GROWTH
Special Requests: ADEQUATE

## 2022-09-22 NOTE — Progress Notes (Signed)
Physical Therapy Treatment Patient Details Name: Todd Brown MRN: [id number] DOB: Jan 16, 1961 Today's Date: 09/22/2022   History of Present Illness Pt is a 62 y.o. 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 MD assessment includes: severe sepsis, community-acquired pneumonia, streptococcus bacteremia, acute on chronic respiratory failure with hypoxia, acute metabolic encephalopathy, acute on chronic diastolic dysfunction CHF, and anasarca.    PT Comments    Pt received sleeping in bed. Generally lethargic and drowsy throughout session but agreeable to participate. Pt does rely on hand over hand cues and VC's to encourage participation. Pt only tolerating standing to RW and side stepping towards HOB due to drowsiness and limited participation and difficulty following commands this date. Pt returns to supine at minA at LE's. Very little physical support needed thus anticipate current d/c recs remain appropriate. PT to continue POC as able.   Recommendations for follow up therapy are one component of a multi-disciplinary discharge planning process, led by the attending physician.  Recommendations may be updated based on patient status, additional functional criteria and insurance authorization.     Assistance Recommended at Discharge Intermittent Supervision/Assistance  Patient can return home with the following A little help with walking and/or transfers;A lot of help with bathing/dressing/bathroom;Assistance with cooking/housework;Direct supervision/assist for medications management;Direct supervision/assist for financial management;Assist for transportation;Help with stairs or ramp for entrance   Equipment  Recommendations  None recommended by PT    Recommendations for Other Services       Precautions / Restrictions Precautions Precautions: Fall Restrictions Weight Bearing Restrictions: No     Mobility  Bed Mobility Overal bed mobility: Needs Assistance Bed Mobility: Supine to Sit, Sit to Supine     Supine to sit: Supervision Sit to supine: Min assist   General bed mobility comments: multimodal cuing to exit bed for participation/ minA at LE's due to lethargy Patient Response: Flat affect, Cooperative  Transfers Overall transfer level: Needs assistance Equipment used: Rolling walker (2 wheels) Transfers: Sit to/from Stand Sit to Stand: Min guard           General transfer comment: hand over hand placement to RW but stands without need for external support with increased time and encouragement.    Ambulation/Gait               General Gait Details: side stepping to the R towards [facility]   Stairs             Wheelchair Mobility    Modified Rankin (Stroke Patients Only)       Balance Overall balance assessment: Needs assistance Sitting-balance support: Feet supported, Bilateral upper extremity supported Sitting balance-[name]: Good     Standing balance support: Bilateral upper extremity supported, During functional activity, Reliant on assistive device for balance Standing balance-[name]: Fair Standing balance comment: using RW for support                            Cognition Arousal/Alertness: Lethargic Behavior During Therapy: Flat affect Overall Cognitive Status: Difficult to assess  Exercises      General Comments General comments (skin integrity, edema, etc.): VSS throughout      Pertinent Vitals/Pain Pain Assessment Pain Assessment: Faces Faces Pain Scale: No hurt    Home Living                          Prior Function            PT  Goals (current goals can now be found in the care plan section) Acute Rehab PT Goals Patient Stated Goal: return home ([place]) PT Goal Formulation: With patient Time For Goal Achievement: 09/29/22 Potential to Achieve Goals: Good Progress towards PT goals: Progressing toward goals    Frequency    Min 3X/week      PT Plan Current plan remains appropriate    Co-evaluation              AM-PAC PT "6 Clicks" Mobility   Outcome Measure  Help needed turning from your back to your side while in a flat bed without using bedrails?: A Little Help needed moving from lying on your back to sitting on the side of a flat bed without using bedrails?: A Little Help needed moving to and from a bed to a chair (including a wheelchair)?: A Little Help needed standing up from a chair using your arms (e.g., wheelchair or bedside chair)?: A Little Help needed to walk in hospital room?: A Lot Help needed climbing 3-5 steps with a railing? : A Lot 6 Click Score: 16    End of Session Equipment Utilized During Treatment: Gait belt Activity Tolerance: Patient tolerated treatment well Patient left: in bed;with call bell/phone within reach;with bed alarm set Nurse Communication: Mobility status PT Visit Diagnosis: Difficulty in walking, not elsewhere classified (R26.2);Muscle weakness (generalized) (M62.81)     Time: 0981-1914 PT Time Calculation (min) (ACUTE ONLY): 18 min  Charges:  $Therapeutic Activity: 8-22 mins                     Delphia Grates. Fairly IV, PT, DPT Physical Therapist- [place]  [facility]  09/22/2022, 10:55 AM

## 2022-09-22 NOTE — TOC Progression Note (Signed)
Transition of Care [facility]) - Progression Note    Patient Details  Name: Todd Brown MRN: [id number] Date of Birth: 1960/12/24  [facility]) CM/SW Contact  Chapman Fitch, RN Phone Number: 09/22/2022, 2:44 PM  Clinical Narrative:    Per MD not medically ready for discharge         Expected Discharge Plan and Services                                               Social Determinants of Health (SDOH) Interventions SDOH Screenings   Food Insecurity: No Food Insecurity (09/16/2022)  Housing: Low Risk  (09/16/2022)  Transportation Needs: No Transportation Needs (09/16/2022)  Utilities: Not At Risk (09/16/2022)  Alcohol Screen: Low Risk  (10/05/2019)  Financial Resource Strain: Low Risk  (10/08/2018)  Physical Activity: Unknown (10/08/2018)  Social Connections: Unknown (10/08/2018)  Stress: No Stress Concern Present (10/08/2018)  Tobacco Use: Low Risk  (09/22/2022)    Readmission Risk Interventions    09/17/2022   12:00 PM  Readmission Risk Prevention Plan  Transportation Screening Complete  Medication Review (RN Care Manager) Complete  HRI or Home Care Consult Complete  Skilled Nursing Facility Not Applicable

## 2022-09-22 NOTE — Progress Notes (Signed)
Date of Admission:  09/14/2022    ID: Todd Brown is a 62 y.o. male  Principal Problem:   Acute on chronic respiratory failure with hypoxia Active Problems:   Schizophrenia   Essential hypertension, benign   Severe sepsis versus SIRS   COPD (chronic obstructive pulmonary disease)   Acute metabolic encephalopathy   Essential hypertension   ESRD on hemodialysis   Bacteremia   CAP (community acquired pneumonia)   Anasarca (mild pulmonary edema, pleural and pericardial effusion, ascites on CT)   Acute on chronic respiratory failure    Subjective: Pt stable Has no specific complaints   Medications:   amLODipine  10 mg Oral q morning   calcium acetate  1,334 mg Oral TID WC   carbamazepine  1,000 mg Oral q morning   cholecalciferol  1,000 Units Oral Daily   cloNIDine  0.2 mg Transdermal Weekly   ferrous sulfate  325 mg Oral Daily   fluPHENAZine  2.5 mg Oral Daily   fluticasone furoate-vilanterol  1 puff Inhalation Daily   heparin  5,000 Units Subcutaneous Q8H   hydrALAZINE  10 mg Oral Q8H   isosorbide mononitrate  60 mg Oral BID   levothyroxine  50 mcg Oral Q0600   multivitamin  1 tablet Oral QHS   pantoprazole  40 mg Oral Daily   trihexyphenidyl  2 mg Oral BID   zinc sulfate  220 mg Oral Daily    Objective: Vital signs in last 24 hours: Patient Vitals for the past 24 hrs:  BP Temp Temp src Pulse Resp SpO2  09/22/22 1933 (!) 164/87 98.1 F (36.7 C) Oral (!) 52 18 97 %  09/22/22 1553 135/77 98 F (36.7 C) -- (!) 50 16 99 %  09/22/22 0800 (!) 153/92 98 F (36.7 C) Oral (!) 55 16 100 %  09/22/22 0353 (!) 165/85 97.8 F (36.6 C) -- (!) 54 16 99 %       PHYSICAL EXAM:  General: sleeps most of the time- oriented in person, blind Lungs: b/la air entry Heart: Regular rate and rhythm, no murmur, rub or gallop. Abdomen: Soft, non-tender,not distended. Bowel sounds normal. No masses Extremities: Left AVG Edema legs Skin: No rashes or lesions. Or  bruising Lymph: Cervical, supraclavicular normal. Neurologic: Grossly non-focal  Lab Results    Latest Ref Rng & Units 09/21/2022    4:48 AM 09/18/2022   11:20 AM 09/17/2022    4:48 AM  CBC  WBC 4.0 - 10.5 K/uL 5.0  3.7  5.1   Hemoglobin 13.0 - 17.0 g/dL 60.4  54.0  98.1   Hematocrit 39.0 - 52.0 % 35.6  32.4  35.2   Platelets 150 - 400 K/uL 190  154  112        Latest Ref Rng & Units 09/21/2022    4:48 AM 09/18/2022   11:20 AM 09/17/2022    4:48 AM  CMP  Glucose 70 - 99 mg/dL 73  191  89   BUN 8 - 23 mg/dL 54  29  29   Creatinine 0.61 - 1.24 mg/dL 4.78  2.95  6.21   Sodium 135 - 145 mmol/L 133  134  136   Potassium 3.5 - 5.1 mmol/L 5.3  3.9  3.9   Chloride 98 - 111 mmol/L 97  96  96   CO2 22 - 32 mmol/L Calcium 8.9 - 10.3 mg/dL 9.3  8.4  8.7  Microbiology: [facility]- streptococcus Studies/Results: ECHO TEE  Result Date: 09/21/2022    TRANSESOPHOGEAL ECHO REPORT   Patient Name:   Todd Brown Date of Exam: 09/21/2022 Medical Rec #:  [id number]        Height:       71.0 in Accession #:    [id number]       Weight:       166.7 lb Date of Birth:  November 16, 1960        BSA:          1.951 m Patient Age:    61 years         BP:           184/86 mmHg Patient Gender: M                HR:           52 bpm. Exam Location:  [facility] Procedure: Transesophageal Echo, Cardiac Doppler and Color Doppler Indications:     Bacteremia R78.81  History:         Patient has prior history of Echocardiogram examinations, most                  recent 09/17/2022. COPD; Risk Factors:Hypertension and                  Dyslipidemia.  Sonographer:     Cristela Blue Referring Phys:  [id number] Raymon Mutton DUNN Diagnosing Phys: Debbe Odea MD PROCEDURE: The transesophogeal probe was passed without difficulty through the esophogus of the patient. Sedation performed by performing physician. The patient developed no complications during the procedure.  IMPRESSIONS  1. Left ventricular ejection fraction, by estimation, is 50  to 55%. The left ventricle has low normal function.  2. Right ventricular systolic function is low normal. The right ventricular size is moderately enlarged.  3. Left atrial size was mild to moderately dilated. No left atrial/left atrial appendage thrombus was detected.  4. Right atrial size was moderately dilated.  5. A small pericardial effusion is present. The pericardial effusion is circumferential. There is no evidence of cardiac tamponade.  6. The mitral valve is normal in structure. Mild mitral valve regurgitation.  7. Tricuspid valve regurgitation is mild to moderate.  8. The aortic valve is tricuspid. Aortic valve regurgitation is mild. Conclusion(s)/Recommendation(s): No evidence of vegetation/infective endocarditis on this transesophageael echocardiogram. FINDINGS  Left Ventricle: Left ventricular ejection fraction, by estimation, is 50 to 55%. The left ventricle has low normal function. The left ventricular internal cavity size was normal in size. Right Ventricle: The right ventricular size is moderately enlarged. No increase in right ventricular wall thickness. Right ventricular systolic function is low normal. Left Atrium: Left atrial size was mild to moderately dilated. No left atrial/left atrial appendage thrombus was detected. Right Atrium: Right atrial size was moderately dilated. Pericardium: A small pericardial effusion is present. The pericardial effusion is circumferential. There is no evidence of cardiac tamponade. Mitral Valve: The mitral valve is normal in structure. Mild mitral valve regurgitation. Tricuspid Valve: The tricuspid valve is normal in structure. Tricuspid valve regurgitation is mild to moderate. Aortic Valve: The aortic valve is tricuspid. Aortic valve regurgitation is mild. Pulmonic Valve: The pulmonic valve was normal in structure. Pulmonic valve regurgitation is not visualized. Aorta: The aortic root is normal in size and structure. IAS/Shunts: No atrial level shunt detected  by color flow Doppler. Debbe Odea MD Electronically signed by Debbe Odea MD Signature Date/Time: 09/21/2022/2:17:54 PM    Final  Assessment/Plan: Encephalopathy secondary to infection, improved t   Streptococcus gallolyticus bacteremia  He had previously the same infection in January 2024.  He refused TEE at that time.  He was also recommended colonoscopy as this organism is associated with colon cancer.   TEE done on 09/21/22 no endocarditis Refusing colonoscopy  need to look into the AV graft as it had been malfunctioning.  Need  limited WBC scan to look at the graft Patient is currently on  ceftriaxone.  Can change to cefazolin Will need antibiotics for a long duration ( can do cefazolin  on discharge during dialysis as well) for 4 weeks 10/14/22   End-stage renal disease on dialysis   Hypertension:    Schizophrenia On fluphenazine Also on Tegretol and trihexyphenidyl   Hypothyroidism on levothyroxine   Blindness both eyes Discussed the management with Hospitalist [facility] physician covering [facility] while I am away starting 09/23/22

## 2022-09-22 NOTE — Progress Notes (Signed)
PROGRESS NOTE    Todd Brown   WJX:[id number] DOB: 1960-07-12  DOA: 09/14/2022 Date of Service: 09/22/22 PCP: Smiley Houseman, NP     Brief Narrative / Hospital Course:  Todd Brown is a 62 y.o. 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  Patient was in his usual state of health earlier and went to and completed his dialysis session but on arrival home he was lethargic.  On arrival of EMS he was saturating in the 70s on room air and was placed on NRB for transport. Of note: bacteremia secondary to strep gallolyticus in 01/24. Suspected source was from the PermCath (PermCath was removed during that hospitalization). Standard ECHO was obtained which did not show any vegetation.  ID was consulted and they requested TEE and colonscopy, however, the pt did not agree to have these procedures. Pt was continued on IV cefazolin 2g MWF with dialysis for strep gallolyticus bacteremia (END DATE Jul 20 2022).   04/15: ED course and data review: Tmax 104.2 with pulse 107 respirations 23 and O2 sat initially 89% on NRB improving to 94% on HFNC at 6 L.  BP was 154/86 on arrival.  VBG on HFNC showed pH 7.5 with pCO2 40.  Labs otherwise significant for WBC 12,100, lactic acid 0.9 and procalcitonin 0.72.  Respiratory viral panel negative for COVID flu and RSV.  Troponin 81. Patient started on cefepime vancomycin and Flagyl for sepsis of unknown source. Hospitalist consulted for admission.  04/16: Blood culture revealed strep species. Discontinued cefepime, vancomycin and Flagyl and place patient on Rocephin 2 g IV daily. Consulted ID - recs for TEE and  colonoscopy, AV graft evauation. Patient did not receive IV fluid resuscitation due to fluid overload. Removed 2L in HD 04/17: dialysis today - examined afterward. Pt has no complaints other than wants something to eat. Removed another 2L in HD.  04/18: cardiology consulted for TEE - await improvement in volume status, TEE will have to be w/ [facility] not [facility] d/t staffing. Repeating BCx. Will d/w guardian re: consent for TEE - she states he was amenable to TEE but did not want colonoscopy.  04/19-04/21: stable thru weekend, planning TEE  04/22: TEE no vegetation.  04/23: plan for WBC tagged imaging tomorrow to eval for infection in AV fistula LUE.   Consultants:  Nephrology  Infectious Disease  Cardiology   Procedures: None       ASSESSMENT & PLAN:   Principal Problem:   Acute on chronic respiratory failure with hypoxia Active Problems:   Severe sepsis versus SIRS   CAP (community acquired pneumonia)   Anasarca (mild pulmonary edema, pleural and pericardial effusion, ascites on CT)   Acute metabolic encephalopathy   ESRD on hemodialysis   Essential hypertension   COPD (chronic obstructive pulmonary disease)   Schizophrenia   Essential hypertension, benign   Bacteremia   Acute on chronic respiratory failure  Severe sepsis  Community-acquired pneumonia  Streptococcus bacteremia.   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  Organ dysfunction of respiratory failure and  altered mental status. Blood culture revealed strep species TEE negative Continue Rocephin 2  g IV daily ID following Pt has adamantly refused colonoscopy  Pending WBC tagged imaging to eval possible infection in AV fistula LUE  Goals of care Patient does not demonstrate adequate understanding of his medical illness(es), he can not make an informed decision about complex treatments -  consent or refusal.  Given frailty and multiple comorbidities, he is less likely to survive resuscitation measures in the event of a cardiopulmonary arrest but I am not convinced at this time that resuscitation would be utterly futile, either. He has no terminal illness at this time which would recommend more strongly for DNR/DNI status, especially since he has voiced that he would want CPR/intubation, life support measures. However, given refusal for colonoscopy, he is not demonstrating willingness to do all that is necessary for preservation/prolongation of life. In my opinion, given overall frailty he is unlikely to survive an arrest and would recommend against CPR but may consider intubation/ventilation on a temporary basis if this was ever indicated.  guardian will have to give consent/refusal for TEE or other procedures including resuscitation measures   Consdier Palliative consult   Acute on chronic respiratory failure with hypoxia - improving Etiology acute on chronic diastolic CHF and CAP  Continue O2 to maintain sats over 92% and wean as tolerated  Acute metabolic encephalopathy - resolved Secondary to sepsis from Streptococcus bacteremia Patient is oriented to person and place but not to time Monitor patient closely    Acute on chronic diastolic dysfunction CHF Anasarca  EF 06/2022 showing EF 60 to 65% with grade 1 diastolic dysfunction CT with findings of volume overload, mild pulmonary edema with pleural effusions, small volume pericardial effusion and anasarca and small volume fluid ascites. Net IO Since Admission: -1,570 mL [09/19/22 1259] Nephrology consult for management of fluid status   ESRD on dialysis Nephrology consult for renal replacement therapy   Essential hypertension continue home meds    COPD (chronic obstructive pulmonary disease) Continue home inhalers DuoNebs as needed   Schizophrenia Continue Prolixin and carbamazepine  Vision impairment Precautions  for fall   DVT prophylaxis: heparin Pertinent IV fluids/nutrition: no continuous IV fluids, cardiac diet  Central lines / invasive devices: none  Code Status: FULL CODE ACP documentation reviewed: none on file   Current Admission Status: inpatient  TOC needs / Dispo plan: HH PT Barriers to discharge / significant pending items: bacteremia, imaging tomorrow to eval graft.    Lenn Sink - guardian [phone] = fax            Subjective / Brief ROS:  Patient reports no concerns  Denies CP/SOB.  Pain controlled.    Family Communication:  None at this time    Objective Findings:  Vitals:   09/21/22 1340 09/21/22 1356 09/22/22 0353 09/22/22 0800  BP: (!) 191/87 (!) 167/91 (!) 165/85 (!) 153/92  Pulse: (!) 55 (!) 54 (!) 54 (!) 55  Resp: 14 18 16 16   Temp:  98.1 F (36.7 C) 97.8 F (36.6 C) 98 F (36.7 C)  TempSrc:    Oral  SpO2: 94% 98% 99% 100%  Weight:      Height:        Intake/Output Summary (Last 24 hours) at 09/22/2022 1410 Last data filed at 09/21/2022 1856 Gross per 24 hour  Intake 240 ml  Output --  Net 240 ml    Filed Weights   09/21/22 0835 09/21/22 1153 09/21/22 1210  Weight: 77.4 kg 75.6 kg 75.6 kg    Examination:  Physical Exam Constitutional:  General: He is not in acute distress. Cardiovascular:     Rate and Rhythm: Normal rate and regular rhythm.  Pulmonary:     Effort: Pulmonary effort is normal.     Breath sounds: Normal breath sounds.  Abdominal:     General: Abdomen is flat.     Palpations: Abdomen is soft.  Musculoskeletal:     Right lower leg: No edema.     Left lower leg: No edema.  Skin:    General: Skin is warm and dry.  Neurological:     General: No focal deficit present.     Mental Status: He is alert. Mental status is at baseline.  Psychiatric:        Behavior: Behavior normal.          Scheduled Medications:   amLODipine  10 mg Oral q morning   calcium acetate  1,334 mg Oral TID WC    carbamazepine  1,000 mg Oral q morning   cholecalciferol  1,000 Units Oral Daily   cloNIDine  0.2 mg Transdermal Weekly   ferrous sulfate  325 mg Oral Daily   fluPHENAZine  2.5 mg Oral Daily   fluticasone furoate-vilanterol  1 puff Inhalation Daily   heparin  5,000 Units Subcutaneous Q8H   hydrALAZINE  10 mg Oral Q8H   isosorbide mononitrate  60 mg Oral BID   levothyroxine  50 mcg Oral Q0600   multivitamin  1 tablet Oral QHS   pantoprazole  40 mg Oral Daily   trihexyphenidyl  2 mg Oral BID   zinc sulfate  220 mg Oral Daily    Continuous Infusions:  cefTRIAXone (ROCEPHIN)  IV 2 g (09/22/22 0846)    PRN Medications:  acetaminophen **OR** acetaminophen, albuterol, HYDROcodone-acetaminophen, lactulose, morphine injection, ondansetron **OR** ondansetron (ZOFRAN) IV, polyethylene glycol  Antimicrobials from admission:  Anti-infectives (From admission, onward)    Start     Dose/Rate Route Frequency Ordered Stop   09/16/22 2200  vancomycin (VANCOREADY) IVPB 750 mg/150 mL  Status:  Discontinued        750 mg 150 mL/hr over 60 Minutes Intravenous Every M-W-F (Hemodialysis) 09/15/22 0031 09/15/22 1329   09/15/22 1000  ceFEPIme (MAXIPIME) 2 g in sodium chloride 0.9 % 100 mL IVPB  Status:  Discontinued        2 g 200 mL/hr over 30 Minutes Intravenous Every 12 hours 09/15/22 0031 09/15/22 0734   09/15/22 0800  metroNIDAZOLE (FLAGYL) IVPB 500 mg  Status:  Discontinued        500 mg 100 mL/hr over 60 Minutes Intravenous Every 12 hours 09/14/22 2248 09/15/22 1329   09/15/22 0800  cefTRIAXone (ROCEPHIN) 2 g in sodium chloride 0.9 % 100 mL IVPB        2 g 200 mL/hr over 30 Minutes Intravenous Every 24 hours 09/15/22 0734     09/15/22 0000  vancomycin (VANCOREADY) IVPB 750 mg/150 mL        750 mg 150 mL/hr over 60 Minutes Intravenous  Once 09/14/22 2258 09/15/22 0200   09/14/22 1915  ceFEPIme (MAXIPIME) 2 g in sodium chloride 0.9 % 100 mL IVPB        2 g 200 mL/hr over 30 Minutes Intravenous   Once 09/14/22 1909 09/14/22 1949   09/14/22 1915  metroNIDAZOLE (FLAGYL) IVPB 500 mg        500 mg 100 mL/hr over 60 Minutes Intravenous  Once 09/14/22 1909 09/14/22 2052   09/14/22 1915  vancomycin (VANCOCIN) IVPB 1000 mg/200 mL premix  1,000 mg 200 mL/hr over 60 Minutes Intravenous  Once 09/14/22 1909 09/14/22 2210           Data Reviewed:  I have personally reviewed the following...  CBC: Recent Labs  Lab 09/17/22 0448 09/18/22 1120 09/21/22 0448  WBC 5.1 3.7* 5.0  HGB 11.6* 10.7* 11.8*  HCT 35.2* 32.4* 35.6*  MCV 101.7* 100.9* 99.7  PLT 112* 154 190    Basic Metabolic Panel: Recent Labs  Lab 09/17/22 0448 09/18/22 1120 09/21/22 0448  NA 136 134* 133*  K 3.9 3.9 5.3*  CL 96* 96* 97*  CO2 29 29 23   GLUCOSE 89 151* 73  BUN 29* 29* 54*  CREATININE 5.16* 4.74* 7.63*  CALCIUM 8.7* 8.4* 9.3  PHOS  --  2.5  --     GFR: Estimated Creatinine Clearance: 10.8 mL/min (A) (by C-G formula based on SCr of 7.63 mg/dL (H)). Liver Function Tests: Recent Labs  Lab 09/18/22 1120  ALBUMIN 2.7*    No results for input(s): "LIPASE", "AMYLASE" in the last 168 hours. No results for input(s): "AMMONIA" in the last 168 hours. Coagulation Profile: No results for input(s): "INR", "PROTIME" in the last 168 hours.  Cardiac Enzymes: No results for input(s): "CKTOTAL", "CKMB", "CKMBINDEX", "TROPONINI" in the last 168 hours. BNP (last 3 results) No results for input(s): "PROBNP" in the last 8760 hours. HbA1C: No results for input(s): "HGBA1C" in the last 72 hours. CBG: No results for input(s): "GLUCAP" in the last 168 hours. Lipid Profile: No results for input(s): "CHOL", "HDL", "LDLCALC", "TRIG", "CHOLHDL", "LDLDIRECT" in the last 72 hours. Thyroid Function Tests: No results for input(s): "TSH", "T4TOTAL", "FREET4", "T3FREE", "THYROIDAB" in the last 72 hours. Anemia Panel: No results for input(s): "VITAMINB12", "FOLATE", "FERRITIN", "TIBC", "IRON", "RETICCTPCT" in  the last 72 hours. Most Recent Urinalysis On File:     Component Value Date/Time   COLORURINE YELLOW (A) 05/17/2015 0012   APPEARANCEUR HAZY (A) 05/17/2015 0012   LABSPEC 1.009 05/17/2015 0012   PHURINE 7.0 05/17/2015 0012   GLUCOSEU NEGATIVE 05/17/2015 0012   HGBUR 1+ (A) 05/17/2015 0012   BILIRUBINUR NEGATIVE 05/17/2015 0012   KETONESUR NEGATIVE 05/17/2015 0012   PROTEINUR 30 (A) 05/17/2015 0012   NITRITE NEGATIVE 05/17/2015 0012   LEUKOCYTESUR 3+ (A) 05/17/2015 0012   Sepsis Labs: @LABRCNTIP (procalcitonin:4,lacticidven:4) Microbiology: Recent Results (from the past 240 hour(s))  Resp panel by RT-PCR (RSV, Flu A&B, Covid) Anterior Nasal Swab     Status: None   Collection Time: 09/14/22  7:10 PM   Specimen: Anterior Nasal Swab  Result Value Ref Range Status   SARS Coronavirus 2 by RT PCR NEGATIVE NEGATIVE Final    Comment: (NOTE) SARS-CoV-2 target nucleic acids are NOT DETECTED.  The SARS-CoV-2 RNA is generally detectable in upper respiratory specimens during the acute phase of infection. The lowest concentration of SARS-CoV-2 viral copies this assay can detect is 138 copies/mL. A negative result does not preclude SARS-Cov-2 infection and should not be used as the sole basis for treatment or other patient management decisions. A negative result may occur with  improper specimen collection/handling, submission of specimen other than nasopharyngeal swab, presence of viral mutation(s) within the areas targeted by this assay, and inadequate number of viral copies(<138 copies/mL). A negative result must be combined with clinical observations, patient history, and epidemiological information. The expected result is Negative.  Fact Sheet for Patients:  BloggerCourse.com  Fact Sheet for Healthcare Providers:  SeriousBroker.it  This test is no t yet approved or cleared by  the [employer] and  has been authorized for  detection and/or diagnosis of SARS-CoV-2 by FDA under an Emergency Use Authorization (EUA). This EUA will remain  in effect (meaning this test can be used) for the duration of the COVID-19 declaration under Section 564(b)(1) of the Act, 21 U.S.C.section 360bbb-3(b)(1), unless the authorization is terminated  or revoked sooner.       Influenza A by PCR NEGATIVE NEGATIVE Final   Influenza B by PCR NEGATIVE NEGATIVE Final    Comment: (NOTE) The Xpert Xpress SARS-CoV-2/FLU/RSV plus assay is intended as an aid in the diagnosis of influenza from Nasopharyngeal swab specimens and should not be used as a sole basis for treatment. Nasal washings and aspirates are unacceptable for Xpert Xpress SARS-CoV-2/FLU/RSV testing.  Fact Sheet for Patients: BloggerCourse.com  Fact Sheet for Healthcare Providers: SeriousBroker.it  This test is not yet approved or cleared by the [place] FDA and has been authorized for detection and/or diagnosis of SARS-CoV-2 by FDA under an Emergency Use Authorization (EUA). This EUA will remain in effect (meaning this test can be used) for the duration of the COVID-19 declaration under Section 564(b)(1) of the Act, 21 U.S.C. section 360bbb-3(b)(1), unless the authorization is terminated or revoked.     Resp Syncytial Virus by PCR NEGATIVE NEGATIVE Final    Comment: (NOTE) Fact Sheet for Patients: BloggerCourse.com  Fact Sheet for Healthcare Providers: SeriousBroker.it  This test is not yet approved or cleared by the [place] FDA and has been authorized for detection and/or diagnosis of SARS-CoV-2 by FDA under an Emergency Use Authorization (EUA). This EUA will remain in effect (meaning this test can be used) for the duration of the COVID-19 declaration under Section 564(b)(1) of the Act, 21 U.S.C. section 360bbb-3(b)(1), unless the authorization is  terminated or revoked.  Performed at [facility], [address], [place], [place] [address]   Blood Culture (routine x 2)     Status: Abnormal   Collection Time: 09/14/22  7:10 PM   Specimen: BLOOD  Result Value Ref Range Status   Specimen Description   Final    BLOOD BLOOD RIGHT WRIST Performed at [facility], [address], [place], [place] [address]    Special Requests   Final    BOTTLES DRAWN AEROBIC AND ANAEROBIC Blood Culture results may not be optimal due to an inadequate volume of blood received in culture bottles Performed at [facility], [address] [place], [place] [address]    Culture  Setup Time   Final    GRAM POSITIVE COCCI IN BOTH AEROBIC AND ANAEROBIC BOTTLES CRITICAL RESULT CALLED TO, READ BACK BY AND VERIFIED WITH: Albertine Grates 09/15/22 0715 MW Performed at [facility], [address], [place], [place] [address]    Culture STREPTOCOCCUS GALLOLYTICUS (A)  Final   Report Status 09/17/2022 FINAL  Final   Organism ID, Bacteria STREPTOCOCCUS GALLOLYTICUS  Final      Susceptibility   Streptococcus gallolyticus - MIC*    PENICILLIN 0.12 SENSITIVE Sensitive     CEFTRIAXONE <=0.12 SENSITIVE Sensitive     ERYTHROMYCIN >=8 RESISTANT Resistant     LEVOFLOXACIN 2 SENSITIVE Sensitive     VANCOMYCIN 0.5 SENSITIVE Sensitive     * STREPTOCOCCUS GALLOLYTICUS  Blood Culture ID Panel (Reflexed)     Status: Abnormal   Collection Time: 09/14/22  7:10 PM  Result Value Ref Range Status   Enterococcus faecalis NOT DETECTED NOT DETECTED Final   Enterococcus Faecium NOT  DETECTED NOT DETECTED Final   Listeria monocytogenes NOT DETECTED NOT DETECTED Final   Staphylococcus species NOT DETECTED NOT DETECTED Final   Staphylococcus aureus (BCID) NOT DETECTED NOT DETECTED Final   Staphylococcus epidermidis NOT DETECTED NOT DETECTED Final   Staphylococcus lugdunensis NOT DETECTED NOT DETECTED Final   Streptococcus species DETECTED (A) NOT  DETECTED Final    Comment: Not Enterococcus species, Streptococcus agalactiae, Streptococcus pyogenes, or Streptococcus pneumoniae. CRITICAL RESULT CALLED TO, READ BACK BY AND VERIFIED WITH: TRAY GREENWOOD 09/15/22 0715 MW    Streptococcus agalactiae NOT DETECTED NOT DETECTED Final   Streptococcus pneumoniae NOT DETECTED NOT DETECTED Final   Streptococcus pyogenes NOT DETECTED NOT DETECTED Final   A.calcoaceticus-baumannii NOT DETECTED NOT DETECTED Final   Bacteroides fragilis NOT DETECTED NOT DETECTED Final   Enterobacterales NOT DETECTED NOT DETECTED Final   Enterobacter cloacae complex NOT DETECTED NOT DETECTED Final   Escherichia coli NOT DETECTED NOT DETECTED Final   Klebsiella aerogenes NOT DETECTED NOT DETECTED Final   Klebsiella oxytoca NOT DETECTED NOT DETECTED Final   Klebsiella pneumoniae NOT DETECTED NOT DETECTED Final   Proteus species NOT DETECTED NOT DETECTED Final   Salmonella species NOT DETECTED NOT DETECTED Final   Serratia marcescens NOT DETECTED NOT DETECTED Final   Haemophilus influenzae NOT DETECTED NOT DETECTED Final   Neisseria meningitidis NOT DETECTED NOT DETECTED Final   Pseudomonas aeruginosa NOT DETECTED NOT DETECTED Final   Stenotrophomonas maltophilia NOT DETECTED NOT DETECTED Final   Candida albicans NOT DETECTED NOT DETECTED Final   Candida auris NOT DETECTED NOT DETECTED Final   Candida glabrata NOT DETECTED NOT DETECTED Final   Candida krusei NOT DETECTED NOT DETECTED Final   Candida parapsilosis NOT DETECTED NOT DETECTED Final   Candida tropicalis NOT DETECTED NOT DETECTED Final   Cryptococcus neoformans/gattii NOT DETECTED NOT DETECTED Final    Comment: Performed at [facility], [address] [place], [place] [address]  Blood Culture (routine x 2)     Status: Abnormal   Collection Time: 09/14/22  7:15 PM   Specimen: BLOOD  Result Value Ref Range Status   Specimen Description   Final    BLOOD RIGHT ANTECUBITAL Performed at [facility], [address] [place], [place] [address]    Special Requests   Final    BOTTLES DRAWN AEROBIC AND ANAEROBIC Blood Culture results may not be optimal due to an inadequate volume of blood received in culture bottles Performed at [facility], [address] [place], [place] [address]    Culture  Setup Time   Final    GRAM POSITIVE COCCI IN BOTH AEROBIC AND ANAEROBIC BOTTLES CRITICAL RESULT CALLED TO, READ BACK BY AND VERIFIED WITH: TRAY GREENWOOD 09/15/22 0715 MW CRITICAL VALUE NOTED.  VALUE IS CONSISTENT WITH PREVIOUSLY REPORTED AND CALLED VALUE.    Culture (A)  Final    STREPTOCOCCUS GALLOLYTICUS SUSCEPTIBILITIES PERFORMED ON PREVIOUS CULTURE WITHIN THE LAST 5 DAYS. Performed at [facility], [address] [place], [place] [address]    Report Status 09/17/2022 FINAL  Final  Culture, blood (Routine X 2) w Reflex to ID Panel     Status: None   Collection Time: 09/17/22  8:49 AM   Specimen: BLOOD  Result Value Ref Range Status   Specimen Description BLOOD RIGHT HAND  Final   Special Requests   Final    BOTTLES DRAWN AEROBIC ONLY Blood Culture adequate volume   Culture   Final    NO GROWTH 5  DAYS Performed at [facility], [address] [place], [place] [address]    Report Status 09/22/2022 FINAL  Final  Culture, blood (Routine X 2) w Reflex to ID Panel     Status: None   Collection Time: 09/17/22  8:49 AM   Specimen: BLOOD  Result Value Ref Range Status   Specimen Description BLOOD LEFT HAND  Final   Special Requests   Final    BOTTLES DRAWN AEROBIC AND ANAEROBIC Blood Culture results may not be optimal due to an excessive volume of blood received in culture bottles   Culture   Final    NO GROWTH 5 DAYS Performed at [facility], [address] [place], [place] [address]    Report Status 09/22/2022 FINAL  Final  MRSA Next Gen by PCR, Nasal     Status: None   Collection Time: 09/18/22 10:51 PM   Specimen: Nasal Mucosa;  Nasal Swab  Result Value Ref Range Status   MRSA by PCR Next Gen NOT DETECTED NOT DETECTED Final    Comment: (NOTE) The GeneXpert MRSA Assay (FDA approved for NASAL specimens only), is one component of a comprehensive MRSA colonization surveillance program. It is not intended to diagnose MRSA infection nor to guide or monitor treatment for MRSA infections. Test performance is not FDA approved in patients less than 19 years old. Performed at [facility], [address], [place], [place] [address]       Radiology Studies last 3 days: ECHO TEE  Result Date: 09/21/2022    TRANSESOPHOGEAL ECHO REPORT   Patient Name:   Todd Brown Date of Exam: 09/21/2022 Medical Rec #:  [id number]        Height:       71.0 in Accession #:    [id number]       Weight:       166.7 lb Date of Birth:  06-May-1961        BSA:          1.951 m Patient Age:    61 years         BP:           184/86 mmHg Patient Gender: M                HR:           52 bpm. Exam Location:  [facility] Procedure: Transesophageal Echo, Cardiac Doppler and Color Doppler Indications:     Bacteremia R78.81  History:         Patient has prior history of Echocardiogram examinations, most                  recent 09/17/2022. COPD; Risk Factors:Hypertension and                  Dyslipidemia.  Sonographer:     Cristela Blue Referring Phys:  [id number] Raymon Mutton DUNN Diagnosing Phys: Debbe Odea MD PROCEDURE: The transesophogeal probe was passed without difficulty through the esophogus of the patient. Sedation performed by performing physician. The patient developed no complications during the procedure.  IMPRESSIONS  1. Left ventricular ejection fraction, by estimation, is 50 to 55%. The left ventricle has low normal function.  2. Right ventricular systolic function is low normal. The right ventricular size is moderately enlarged.  3. Left atrial size was mild to moderately dilated. No left atrial/left atrial appendage thrombus was detected.  4. Right  atrial size was moderately dilated.  5. A small  pericardial effusion is present. The pericardial effusion is circumferential. There is no evidence of cardiac tamponade.  6. The mitral valve is normal in structure. Mild mitral valve regurgitation.  7. Tricuspid valve regurgitation is mild to moderate.  8. The aortic valve is tricuspid. Aortic valve regurgitation is mild. Conclusion(s)/Recommendation(s): No evidence of vegetation/infective endocarditis on this transesophageael echocardiogram. FINDINGS  Left Ventricle: Left ventricular ejection fraction, by estimation, is 50 to 55%. The left ventricle has low normal function. The left ventricular internal cavity size was normal in size. Right Ventricle: The right ventricular size is moderately enlarged. No increase in right ventricular wall thickness. Right ventricular systolic function is low normal. Left Atrium: Left atrial size was mild to moderately dilated. No left atrial/left atrial appendage thrombus was detected. Right Atrium: Right atrial size was moderately dilated. Pericardium: A small pericardial effusion is present. The pericardial effusion is circumferential. There is no evidence of cardiac tamponade. Mitral Valve: The mitral valve is normal in structure. Mild mitral valve regurgitation. Tricuspid Valve: The tricuspid valve is normal in structure. Tricuspid valve regurgitation is mild to moderate. Aortic Valve: The aortic valve is tricuspid. Aortic valve regurgitation is mild. Pulmonic Valve: The pulmonic valve was normal in structure. Pulmonic valve regurgitation is not visualized. Aorta: The aortic root is normal in size and structure. IAS/Shunts: No atrial level shunt detected by color flow Doppler. Debbe Odea MD Electronically signed by Debbe Odea MD Signature Date/Time: 09/21/2022/2:17:54 PM    Final              LOS: 8 days        Sunnie Nielsen, DO [facility] 09/22/2022, 2:10 PM    Dictation software may  have been used to generate the above note. Typos may occur and escape review in typed/dictated notes. Please contact Dr Lyn Hollingshead directly for clarity if needed.  Staff may message me via secure chat in Epic  but this may not receive an immediate response,  please page me for urgent matters!  If 7PM-7AM, please contact night coverage [email or web address]

## 2022-09-22 NOTE — Progress Notes (Signed)
[facility]  ROUNDING NOTE   Subjective:   Todd Brown is a 62 year old male with past medical conditions including COPD, hypertension, DVT, schizophrenia, and end-stage renal disease on hemodialysis.  Patient presents to the emergency department with lethargy.   Patient is known to our practice and receives outpatient dialysis treatments at [facility] on a MWF schedule, supervised by Dr. Cherylann Ratel.   Patient seen resting quietly Eyes closed, easily aroused No complaints to offer  Dialysis scheduled for Wednesday  Objective:  Vital signs in last 24 hours:  Temp:  [97.8 F (36.6 C)-98.1 F (36.7 C)] 98 F (36.7 C) (04/23 0800) Pulse Rate:  [52-68] 55 (04/23 0800) Resp:  [10-18] 16 (04/23 0800) BP: (153-206)/(82-93) 153/92 (04/23 0800) SpO2:  [93 %-100 %] 100 % (04/23 0800) Weight:  [75.6 kg] 75.6 kg (04/22 1210)  Weight change:  Filed Weights   09/21/22 0835 09/21/22 1153 09/21/22 1210  Weight: 77.4 kg 75.6 kg 75.6 kg    Intake/Output: I/O last 3 completed shifts: In: 480 [P.O.:480] Out: 2000 [Other:2000]   Intake/Output this shift:  No intake/output data recorded.  Physical Exam: General: NAD  Head: Normocephalic, atraumatic. Moist oral mucosal membranes  Eyes: Anicteric  Lungs:  Diminished in bases, normal effort, [place] O2  Heart: Regular rate and rhythm  Abdomen:  Soft, nontender  Extremities:  No peripheral edema.  Neurologic: Alert and oriented  Skin: No lesions  Access: Lt avf    Basic Metabolic Panel: Recent Labs  Lab 09/17/22 0448 09/18/22 1120 09/21/22 0448  NA 136 134* 133*  K 3.9 3.9 5.3*  CL 96* 96* 97*  CO2 GLUCOSE 89 151* 73  BUN 29* 29* 54*  CREATININE 5.16* 4.74* 7.63*  CALCIUM 8.7* 8.4* 9.3  PHOS  --  2.5  --      Liver Function Tests: Recent Labs  Lab 09/18/22 1120  ALBUMIN 2.7*    No results for input(s): "LIPASE", "AMYLASE" in the last 168 hours. No results for input(s): "AMMONIA" in the  last 168 hours.  CBC: Recent Labs  Lab 09/17/22 0448 09/18/22 1120 09/21/22 0448  WBC 5.1 3.7* 5.0  HGB 11.6* 10.7* 11.8*  HCT 35.2* 32.4* 35.6*  MCV 101.7* 100.9* 99.7  PLT 112* 154 190     Cardiac Enzymes: No results for input(s): "CKTOTAL", "CKMB", "CKMBINDEX", "TROPONINI" in the last 168 hours.  BNP: Invalid input(s): "POCBNP"  CBG: No results for input(s): "GLUCAP" in the last 168 hours.  Microbiology: Results for orders placed or performed during the hospital encounter of 09/14/22  Resp panel by RT-PCR (RSV, Flu A&B, Covid) Anterior Nasal Swab     Status: None   Collection Time: 09/14/22  7:10 PM   Specimen: Anterior Nasal Swab  Result Value Ref Range Status   SARS Coronavirus 2 by RT PCR NEGATIVE NEGATIVE Final    Comment: (NOTE) SARS-CoV-2 target nucleic acids are NOT DETECTED.  The SARS-CoV-2 RNA is generally detectable in upper respiratory specimens during the acute phase of infection. The lowest concentration of SARS-CoV-2 viral copies this assay can detect is 138 copies/mL. A negative result does not preclude SARS-Cov-2 infection and should not be used as the sole basis for treatment or other patient management decisions. A negative result may occur with  improper specimen collection/handling, submission of specimen other than nasopharyngeal swab, presence of viral mutation(s) within the areas targeted by this assay, and inadequate number of viral copies(<138 copies/mL). A negative result must be combined with clinical  observations, patient history, and epidemiological information. The expected result is Negative.  Fact Sheet for Patients:  BloggerCourse.com  Fact Sheet for Healthcare Providers:  SeriousBroker.it  This test is no t yet approved or cleared by the [place] FDA and  has been authorized for detection and/or diagnosis of SARS-CoV-2 by FDA under an Emergency Use Authorization (EUA).  This EUA will remain  in effect (meaning this test can be used) for the duration of the COVID-19 declaration under Section 564(b)(1) of the Act, 21 U.S.C.section 360bbb-3(b)(1), unless the authorization is terminated  or revoked sooner.       Influenza A by PCR NEGATIVE NEGATIVE Final   Influenza B by PCR NEGATIVE NEGATIVE Final    Comment: (NOTE) The Xpert Xpress SARS-CoV-2/FLU/RSV plus assay is intended as an aid in the diagnosis of influenza from Nasopharyngeal swab specimens and should not be used as a sole basis for treatment. Nasal washings and aspirates are unacceptable for Xpert Xpress SARS-CoV-2/FLU/RSV testing.  Fact Sheet for Patients: BloggerCourse.com  Fact Sheet for Healthcare Providers: SeriousBroker.it  This test is not yet approved or cleared by the [place] FDA and has been authorized for detection and/or diagnosis of SARS-CoV-2 by FDA under an Emergency Use Authorization (EUA). This EUA will remain in effect (meaning this test can be used) for the duration of the COVID-19 declaration under Section 564(b)(1) of the Act, 21 U.S.C. section 360bbb-3(b)(1), unless the authorization is terminated or revoked.     Resp Syncytial Virus by PCR NEGATIVE NEGATIVE Final    Comment: (NOTE) Fact Sheet for Patients: BloggerCourse.com  Fact Sheet for Healthcare Providers: SeriousBroker.it  This test is not yet approved or cleared by the [place] FDA and has been authorized for detection and/or diagnosis of SARS-CoV-2 by FDA under an Emergency Use Authorization (EUA). This EUA will remain in effect (meaning this test can be used) for the duration of the COVID-19 declaration under Section 564(b)(1) of the Act, 21 U.S.C. section 360bbb-3(b)(1), unless the authorization is terminated or revoked.  Performed at [facility], [address],  [place], [place] [address]   Blood Culture (routine x 2)     Status: Abnormal   Collection Time: 09/14/22  7:10 PM   Specimen: BLOOD  Result Value Ref Range Status   Specimen Description   Final    BLOOD BLOOD RIGHT WRIST Performed at [facility], [address] [place], [place] [address]    Special Requests   Final    BOTTLES DRAWN AEROBIC AND ANAEROBIC Blood Culture results may not be optimal due to an inadequate volume of blood received in culture bottles Performed at [facility], [address], [place], [place] [address]    Culture  Setup Time   Final    GRAM POSITIVE COCCI IN BOTH AEROBIC AND ANAEROBIC BOTTLES CRITICAL RESULT CALLED TO, READ BACK BY AND VERIFIED WITH: Albertine Grates 09/15/22 0715 MW Performed at [facility], [address], [place], [place] [address]    Culture STREPTOCOCCUS GALLOLYTICUS (A)  Final   Report Status 09/17/2022 FINAL  Final   Organism ID, Bacteria STREPTOCOCCUS GALLOLYTICUS  Final      Susceptibility   Streptococcus gallolyticus - MIC*    PENICILLIN 0.12 SENSITIVE Sensitive     CEFTRIAXONE <=0.12 SENSITIVE Sensitive     ERYTHROMYCIN >=8 RESISTANT Resistant     LEVOFLOXACIN 2 SENSITIVE Sensitive     VANCOMYCIN 0.5 SENSITIVE Sensitive     * STREPTOCOCCUS GALLOLYTICUS  Blood Culture  ID Panel (Reflexed)     Status: Abnormal   Collection Time: 09/14/22  7:10 PM  Result Value Ref Range Status   Enterococcus faecalis NOT DETECTED NOT DETECTED Final   Enterococcus Faecium NOT DETECTED NOT DETECTED Final   Listeria monocytogenes NOT DETECTED NOT DETECTED Final   Staphylococcus species NOT DETECTED NOT DETECTED Final   Staphylococcus aureus (BCID) NOT DETECTED NOT DETECTED Final   Staphylococcus epidermidis NOT DETECTED NOT DETECTED Final   Staphylococcus lugdunensis NOT DETECTED NOT DETECTED Final   Streptococcus species DETECTED (A) NOT DETECTED Final    Comment: Not Enterococcus species, Streptococcus agalactiae,  Streptococcus pyogenes, or Streptococcus pneumoniae. CRITICAL RESULT CALLED TO, READ BACK BY AND VERIFIED WITH: TRAY GREENWOOD 09/15/22 0715 MW    Streptococcus agalactiae NOT DETECTED NOT DETECTED Final   Streptococcus pneumoniae NOT DETECTED NOT DETECTED Final   Streptococcus pyogenes NOT DETECTED NOT DETECTED Final   A.calcoaceticus-baumannii NOT DETECTED NOT DETECTED Final   Bacteroides fragilis NOT DETECTED NOT DETECTED Final   Enterobacterales NOT DETECTED NOT DETECTED Final   Enterobacter cloacae complex NOT DETECTED NOT DETECTED Final   Escherichia coli NOT DETECTED NOT DETECTED Final   Klebsiella aerogenes NOT DETECTED NOT DETECTED Final   Klebsiella oxytoca NOT DETECTED NOT DETECTED Final   Klebsiella pneumoniae NOT DETECTED NOT DETECTED Final   Proteus species NOT DETECTED NOT DETECTED Final   Salmonella species NOT DETECTED NOT DETECTED Final   Serratia marcescens NOT DETECTED NOT DETECTED Final   Haemophilus influenzae NOT DETECTED NOT DETECTED Final   Neisseria meningitidis NOT DETECTED NOT DETECTED Final   Pseudomonas aeruginosa NOT DETECTED NOT DETECTED Final   Stenotrophomonas maltophilia NOT DETECTED NOT DETECTED Final   Candida albicans NOT DETECTED NOT DETECTED Final   Candida auris NOT DETECTED NOT DETECTED Final   Candida glabrata NOT DETECTED NOT DETECTED Final   Candida krusei NOT DETECTED NOT DETECTED Final   Candida parapsilosis NOT DETECTED NOT DETECTED Final   Candida tropicalis NOT DETECTED NOT DETECTED Final   Cryptococcus neoformans/gattii NOT DETECTED NOT DETECTED Final    Comment: Performed at [facility], [address] [place], [place] [address]  Blood Culture (routine x 2)     Status: Abnormal   Collection Time: 09/14/22  7:15 PM   Specimen: BLOOD  Result Value Ref Range Status   Specimen Description   Final    BLOOD RIGHT ANTECUBITAL Performed at [facility], [address] [place], [place] [address]    Special Requests    Final    BOTTLES DRAWN AEROBIC AND ANAEROBIC Blood Culture results may not be optimal due to an inadequate volume of blood received in culture bottles Performed at [facility], [address] [place], [place] [address]    Culture  Setup Time   Final    GRAM POSITIVE COCCI IN BOTH AEROBIC AND ANAEROBIC BOTTLES CRITICAL RESULT CALLED TO, READ BACK BY AND VERIFIED WITH: TRAY GREENWOOD 09/15/22 0715 MW CRITICAL VALUE NOTED.  VALUE IS CONSISTENT WITH PREVIOUSLY REPORTED AND CALLED VALUE.    Culture (A)  Final    STREPTOCOCCUS GALLOLYTICUS SUSCEPTIBILITIES PERFORMED ON PREVIOUS CULTURE WITHIN THE LAST 5 DAYS. Performed at [facility], [address] [place], [place] [address]    Report Status 09/17/2022 FINAL  Final  Culture, blood (Routine X 2) w Reflex to ID Panel     Status: None   Collection Time: 09/17/22  8:49 AM   Specimen: BLOOD  Result Value Ref Range Status  Specimen Description BLOOD RIGHT HAND  Final   Special Requests   Final    BOTTLES DRAWN AEROBIC ONLY Blood Culture adequate volume   Culture   Final    NO GROWTH 5 DAYS Performed at [facility], [address] [place], [place] [address]    Report Status 09/22/2022 FINAL  Final  Culture, blood (Routine X 2) w Reflex to ID Panel     Status: None   Collection Time: 09/17/22  8:49 AM   Specimen: BLOOD  Result Value Ref Range Status   Specimen Description BLOOD LEFT HAND  Final   Special Requests   Final    BOTTLES DRAWN AEROBIC AND ANAEROBIC Blood Culture results may not be optimal due to an excessive volume of blood received in culture bottles   Culture   Final    NO GROWTH 5 DAYS Performed at [facility], [address], [place], [place] [address]    Report Status 09/22/2022 FINAL  Final  MRSA Next Gen by PCR, Nasal     Status: None   Collection Time: 09/18/22 10:51 PM   Specimen: Nasal Mucosa; Nasal Swab  Result Value Ref Range Status   MRSA by PCR Next Gen NOT DETECTED  NOT DETECTED Final    Comment: (NOTE) The GeneXpert MRSA Assay (FDA approved for NASAL specimens only), is one component of a comprehensive MRSA colonization surveillance program. It is not intended to diagnose MRSA infection nor to guide or monitor treatment for MRSA infections. Test performance is not FDA approved in patients less than 79 years old. Performed at [facility], [address] [place], [place] [address]     Coagulation Studies: No results for input(s): "LABPROT", "INR" in the last 72 hours.   Urinalysis: No results for input(s): "COLORURINE", "LABSPEC", "PHURINE", "GLUCOSEU", "HGBUR", "BILIRUBINUR", "KETONESUR", "PROTEINUR", "UROBILINOGEN", "NITRITE", "LEUKOCYTESUR" in the last 72 hours.  Invalid input(s): "APPERANCEUR"    Imaging: ECHO TEE  Result Date: 09/21/2022    TRANSESOPHOGEAL ECHO REPORT   Patient Name:   Todd Brown Date of Exam: 09/21/2022 Medical Rec #:  [id number]        Height:       71.0 in Accession #:    [id number]       Weight:       166.7 lb Date of Birth:  09-Feb-1961        BSA:          1.951 m Patient Age:    61 years         BP:           184/86 mmHg Patient Gender: M                HR:           52 bpm. Exam Location:  [facility] Procedure: Transesophageal Echo, Cardiac Doppler and Color Doppler Indications:     Bacteremia R78.81  History:         Patient has prior history of Echocardiogram examinations, most                  recent 09/17/2022. COPD; Risk Factors:Hypertension and                  Dyslipidemia.  Sonographer:     Cristela Blue Referring Phys:  [id number] Raymon Mutton DUNN Diagnosing Phys: Debbe Odea MD PROCEDURE: The transesophogeal probe was passed without difficulty through the esophogus of the patient. Sedation performed by performing physician. The patient  developed no complications during the procedure.  IMPRESSIONS  1. Left ventricular ejection fraction, by estimation, is 50 to 55%. The left ventricle has low normal function.  2.  Right ventricular systolic function is low normal. The right ventricular size is moderately enlarged.  3. Left atrial size was mild to moderately dilated. No left atrial/left atrial appendage thrombus was detected.  4. Right atrial size was moderately dilated.  5. A small pericardial effusion is present. The pericardial effusion is circumferential. There is no evidence of cardiac tamponade.  6. The mitral valve is normal in structure. Mild mitral valve regurgitation.  7. Tricuspid valve regurgitation is mild to moderate.  8. The aortic valve is tricuspid. Aortic valve regurgitation is mild. Conclusion(s)/Recommendation(s): No evidence of vegetation/infective endocarditis on this transesophageael echocardiogram. FINDINGS  Left Ventricle: Left ventricular ejection fraction, by estimation, is 50 to 55%. The left ventricle has low normal function. The left ventricular internal cavity size was normal in size. Right Ventricle: The right ventricular size is moderately enlarged. No increase in right ventricular wall thickness. Right ventricular systolic function is low normal. Left Atrium: Left atrial size was mild to moderately dilated. No left atrial/left atrial appendage thrombus was detected. Right Atrium: Right atrial size was moderately dilated. Pericardium: A small pericardial effusion is present. The pericardial effusion is circumferential. There is no evidence of cardiac tamponade. Mitral Valve: The mitral valve is normal in structure. Mild mitral valve regurgitation. Tricuspid Valve: The tricuspid valve is normal in structure. Tricuspid valve regurgitation is mild to moderate. Aortic Valve: The aortic valve is tricuspid. Aortic valve regurgitation is mild. Pulmonic Valve: The pulmonic valve was normal in structure. Pulmonic valve regurgitation is not visualized. Aorta: The aortic root is normal in size and structure. IAS/Shunts: No atrial level shunt detected by color flow Doppler. Debbe Odea MD  Electronically signed by Debbe Odea MD Signature Date/Time: 09/21/2022/2:17:54 PM    Final      Medications:    cefTRIAXone (ROCEPHIN)  IV 2 g (09/22/22 0846)    amLODipine  10 mg Oral q morning   calcium acetate  1,334 mg Oral TID WC   carbamazepine  1,000 mg Oral q morning   cholecalciferol  1,000 Units Oral Daily   cloNIDine  0.2 mg Transdermal Weekly   ferrous sulfate  325 mg Oral Daily   fluPHENAZine  2.5 mg Oral Daily   fluticasone furoate-vilanterol  1 puff Inhalation Daily   heparin  5,000 Units Subcutaneous Q8H   hydrALAZINE  10 mg Oral Q8H   isosorbide mononitrate  60 mg Oral BID   levothyroxine  50 mcg Oral Q0600   multivitamin  1 tablet Oral QHS   pantoprazole  40 mg Oral Daily   trihexyphenidyl  2 mg Oral BID   zinc sulfate  220 mg Oral Daily   acetaminophen **OR** acetaminophen, albuterol, HYDROcodone-acetaminophen, lactulose, morphine injection, ondansetron **OR** ondansetron (ZOFRAN) IV, polyethylene glycol  Assessment/ Plan:  [name] is a 62 y.o.  male is a 62 year old male with past medical conditions including COPD, hypertension, DVT, schizophrenia, and end-stage renal disease on hemodialysis.  Patient presents to the emergency department with lethargy  [facility]/MWF/left aVF   Fluid volume overload with end stage renal disease on hemodialysis. CT chest abd pelvis show fluid overload with mild pulmonary edema and pleural effusions.    Dialysis tolerated well yesterday, 2L UF achieved. Next treatment scheduled for Wednesday  2. Anemia of chronic kidney disease Lab Results  Component Value Date  HGB 11.8 (L) 09/21/2022    Hemoglobin remains at goal  3. Secondary Hyperparathyroidism: with outpatient labs: PTH 459, phosphorus 3.4, calcium 9.7 on 06/08/22 .   Lab Results  Component Value Date   PTH 194 (H) 05/11/2017   CALCIUM 9.3 09/21/2022   CAION 1.19 03/27/2022   PHOS 2.5 09/18/2022   Continue cholecalciferol and calcium  acetate.  4.  Hypertension with chronic kidney disease.  Home regimen includes amlodipine, clonidine, hydralazine, isosorbide, doxazosin and minoxidil.  All currently held at this time.  Blood pressure acceptable, 153/92   LOS: 8   4/23/202411:34 AM

## 2022-09-23 ENCOUNTER — Inpatient Hospital Stay: Payer: Medicaid Other

## 2022-09-23 DIAGNOSIS — J9621 Acute and chronic respiratory failure with hypoxia: Secondary | ICD-10-CM | POA: Diagnosis not present

## 2022-09-23 LAB — RENAL FUNCTION PANEL
Albumin: 2.9 g/dL — ABNORMAL LOW (ref 3.5–5.0)
Anion gap: 10 (ref 5–15)
BUN: 49 mg/dL — ABNORMAL HIGH (ref 8–23)
CO2: 26 mmol/L (ref 22–32)
Calcium: 9.1 mg/dL (ref 8.9–10.3)
Chloride: 95 mmol/L — ABNORMAL LOW (ref 98–111)
Creatinine, Ser: 7.42 mg/dL — ABNORMAL HIGH (ref 0.61–1.24)
GFR, Estimated: 8 mL/min — ABNORMAL LOW (ref >60)
Glucose, Bld: 74 mg/dL (ref 70–99)
Phosphorus: 4.4 mg/dL (ref 2.5–4.6)
Potassium: 5.2 mmol/L — ABNORMAL HIGH (ref 3.5–5.1)
Sodium: 131 mmol/L — ABNORMAL LOW (ref 135–145)

## 2022-09-23 LAB — CBC
HCT: 35.2 % — ABNORMAL LOW (ref 39.0–52.0)
Hemoglobin: 11.9 g/dL — ABNORMAL LOW (ref 13.0–17.0)
MCH: 33.5 pg (ref 26.0–34.0)
MCHC: 33.8 g/dL (ref 30.0–36.0)
MCV: 99.2 fL (ref 80.0–100.0)
Platelets: 194 10*3/uL (ref 150–400)
RBC: 3.55 MIL/uL — ABNORMAL LOW (ref 4.22–5.81)
RDW: 14 % (ref 11.5–15.5)
WBC: 3.7 10*3/uL — ABNORMAL LOW (ref 4.0–10.5)
nRBC: 0 % (ref 0.0–0.2)

## 2022-09-23 MED ORDER — TECHNETIUM TC 99M EXAMETAZIME IV KIT
10.0000 | PACK | Freq: Once | INTRAVENOUS | Status: AC | PRN
  Administered 2022-09-23: 10.47 via INTRAVENOUS

## 2022-09-23 MED ORDER — MINOXIDIL 2.5 MG PO TABS
2.5000 mg | ORAL_TABLET | Freq: Every day | ORAL | Status: DC
  Administered 2022-09-23 – 2022-09-24 (×2): 2.5 mg via ORAL
  Filled 2022-09-23 (×2): qty 1

## 2022-09-23 MED ORDER — HYDRALAZINE HCL 50 MG PO TABS
50.0000 mg | ORAL_TABLET | Freq: Three times a day (TID) | ORAL | Status: DC
  Administered 2022-09-23 – 2022-09-24 (×4): 50 mg via ORAL
  Filled 2022-09-23 (×4): qty 1

## 2022-09-23 MED ORDER — HYDRALAZINE HCL 20 MG/ML IJ SOLN
10.0000 mg | Freq: Once | INTRAMUSCULAR | Status: AC
  Administered 2022-09-23: 10 mg via INTRAVENOUS
  Filled 2022-09-23: qty 0.5

## 2022-09-23 MED ORDER — POLYETHYLENE GLYCOL 3350 17 G PO PACK
17.0000 g | PACK | Freq: Every day | ORAL | Status: DC
  Administered 2022-09-24: 17 g via ORAL
  Filled 2022-09-23: qty 1

## 2022-09-23 NOTE — Progress Notes (Signed)
Physical Therapy Treatment Patient Details Name: Todd Brown MRN: [id number] DOB: [birth year] Today's Date: 09/23/2022   History of Present Illness Pt is a 62 y.o. 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 MD assessment includes: severe sepsis, community-acquired pneumonia, streptococcus bacteremia, acute on chronic respiratory failure with hypoxia, acute metabolic encephalopathy, acute on chronic diastolic dysfunction CHF, and anasarca.    PT Comments    Pt received upright agreeable to PT after his procedure. Attempts made to progress mobility for SPT to recliner however vision and cognition greatly limiting session today in safe completion. 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 Attempts made using hand over hand cuing for understanding of location of chair but upon standing efforts pt attempts forward ambulation despite max multimodal cuing for side steps and SPT. Pt returned to sitting then supine with all needs in reach. Anticipate pt may be a 2 person assist for safety to transfer to recliner as pt is in an unfamiliar environment. D/c recs remain appropriate and PT will continue to follow per POC.   Recommendations for follow up therapy are one component of a multi-disciplinary discharge planning process, led by the attending physician.  Recommendations may be updated based on patient status, additional functional criteria and insurance authorization.  Assistance Recommended at Discharge Intermittent Supervision/Assistance  Patient can return home with the following A little help with walking and/or  transfers;A lot of help with bathing/dressing/bathroom;Assistance with cooking/housework;Direct supervision/assist for medications management;Direct supervision/assist for financial management;Assist for transportation;Help with stairs or ramp for entrance   Equipment Recommendations  None recommended by PT    Recommendations for Other Services       Precautions / Restrictions Precautions Precautions: Fall Restrictions Weight Bearing Restrictions: No     Mobility  Bed Mobility Overal bed mobility: Needs Assistance Bed Mobility: Supine to Sit, Sit to Supine     Supine to sit: Supervision, HOB elevated Sit to supine: Supervision     Patient Response: Flat affect, Cooperative  Transfers Overall transfer level: Needs assistance Equipment used: Rolling walker (2 wheels) Transfers: Sit to/from Stand Sit to Stand: Min guard                Ambulation/Gait                   Stairs             Wheelchair Mobility    Modified Rankin (Stroke Patients Only)       Balance Overall balance assessment: Needs assistance Sitting-balance support: Feet supported, Bilateral upper extremity supported Sitting balance-[name]: Good     Standing balance support: Bilateral upper extremity supported, During functional activity, Reliant on assistive device for balance Standing balance-[name]: Fair Standing balance comment: using RW for support                            Cognition Arousal/Alertness: Awake/alert Behavior During Therapy: WFL for tasks assessed/performed Overall Cognitive Status: History of cognitive impairments - at baseline  Exercises General Exercises - Lower Extremity Hip Flexion/Marching: AROM, Strengthening, Both, 10 reps, Seated    General Comments        Pertinent Vitals/Pain Pain Assessment Pain Assessment: Faces Faces Pain Scale: No hurt    Home  Living                          Prior Function            PT Goals (current goals can now be found in the care plan section) Acute Rehab PT Goals Patient Stated Goal: return home (golden years) PT Goal Formulation: With patient Time For Goal Achievement: 09/29/22 Potential to Achieve Goals: Good Progress towards PT goals: Progressing toward goals    Frequency    Min 3X/week      PT Plan Current plan remains appropriate    Co-evaluation              AM-PAC PT "6 Clicks" Mobility   Outcome Measure  Help needed turning from your back to your side while in a flat bed without using bedrails?: A Little Help needed moving from lying on your back to sitting on the side of a flat bed without using bedrails?: A Little Help needed moving to and from a bed to a chair (including a wheelchair)?: A Lot Help needed standing up from a chair using your arms (e.g., wheelchair or bedside chair)?: A Little Help needed to walk in hospital room?: A Lot Help needed climbing 3-5 steps with a railing? : A Lot 6 Click Score: 15    End of Session Equipment Utilized During Treatment: Gait belt Activity Tolerance: Patient tolerated treatment well Patient left: in bed;with call bell/phone within reach;with bed alarm set Nurse Communication: Mobility status PT Visit Diagnosis: Difficulty in walking, not elsewhere classified (R26.2);Muscle weakness (generalized) (M62.81)     Time: 1610-9604 PT Time Calculation (min) (ACUTE ONLY): 16 min  Charges:  $Therapeutic Activity: 8-22 mins                     Delphia Grates. Fairly IV, PT, DPT Physical Therapist- [place]  [facility]  09/23/2022, 3:56 PM

## 2022-09-23 NOTE — Progress Notes (Addendum)
PROGRESS NOTE    Todd Brown   WUJ:[id number] DOB: 07/03/1960  DOA: 09/14/2022 Date of Service: 09/23/22 PCP: Smiley Houseman, NP     Brief Narrative / Hospital Course:  Todd Brown is a 62 y.o. 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  Patient was in his usual state of health earlier and went to and completed his dialysis session but on arrival home he was lethargic.  On arrival of EMS he was saturating in the 70s on room air and was placed on NRB for transport. Of note: bacteremia secondary to strep gallolyticus in 01/24. Suspected source was from the PermCath (PermCath was removed during that hospitalization). Standard ECHO was obtained which did not show any vegetation.  ID was consulted and they requested TEE and colonscopy, however, the pt did not agree to have these procedures. Pt was continued on IV cefazolin 2g MWF with dialysis for strep gallolyticus bacteremia (END DATE Jul 20 2022).   04/15: ED course and data review: Tmax 104.2 with pulse 107 respirations 23 and O2 sat initially 89% on NRB improving to 94% on HFNC at 6 L.  BP was 154/86 on arrival.  VBG on HFNC showed pH 7.5 with pCO2 40.  Labs otherwise significant for WBC 12,100, lactic acid 0.9 and procalcitonin 0.72.  Respiratory viral panel negative for COVID flu and RSV.  Troponin 81. Patient started on cefepime vancomycin and Flagyl for sepsis of unknown source. Hospitalist consulted for admission.  04/16: Blood culture revealed strep species. Discontinued cefepime, vancomycin and Flagyl and place patient on Rocephin 2 g IV daily. Consulted ID - recs for TEE and  colonoscopy, AV graft evauation. Patient did not receive IV fluid resuscitation due to fluid overload. Removed 2L in HD 04/17: dialysis today - examined afterward. Pt has no complaints other than wants something to eat. Removed another 2L in HD.  04/18: cardiology consulted for TEE - await improvement in volume status, TEE will have to be w/ [facility] not [facility] d/t staffing. Repeating BCx. Will d/w guardian re: consent for TEE - she states he was amenable to TEE but did not want colonoscopy.  04/19-04/21: stable thru weekend, planning TEE  04/22: TEE no vegetation.  04/23: plan for WBC tagged imaging tomorrow to eval for infection in AV fistula LUE.    Consultants:  Nephrology  Infectious Disease  Cardiology    Procedures: None            ASSESSMENT & PLAN:   Principal Problem:   Acute on chronic respiratory failure with hypoxia Active Problems:   Severe sepsis versus SIRS   CAP (community acquired pneumonia)   Anasarca (mild pulmonary edema, pleural and pericardial effusion, ascites on CT)   Acute metabolic encephalopathy   ESRD on hemodialysis   Essential hypertension   COPD (chronic obstructive pulmonary disease)   Schizophrenia   Essential hypertension, benign   Bacteremia   Acute on chronic respiratory failure   Severe sepsis  Community-acquired pneumonia  Streptococcus bacteremia.   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  Organ dysfunction of respiratory failure and  altered mental status. Blood culture  revealed strep species TEE negative Continue Rocephin 2 g IV daily ID following Pt has adamantly refused colonoscopy (this organism is associated w/ colon cancer) Pending WBC tagged imaging to eval possible infection in AV fistula LUE, scheduled for today   Goals of care Per prior attending: Patient does not demonstrate adequate understanding  of his medical illness(es), he can not make an informed decision about complex treatments - consent or refusal.  Given frailty and multiple comorbidities, he is less likely to survive resuscitation measures in the event of a cardiopulmonary arrest but I am not convinced at this time that resuscitation would be utterly futile, either. He has no terminal illness at this time which would recommend more strongly for DNR/DNI status, especially since he has voiced that he would want CPR/intubation, life support measures. However, given refusal for colonoscopy, he is not demonstrating willingness to do all that is necessary for preservation/prolongation of life. In my opinion, given overall frailty he is unlikely to survive an arrest and would recommend against CPR but may consider intubation/ventilation on a temporary basis if this was ever indicated.  guardian will have to give consent/refusal for TEE or other procedures including resuscitation measures     Acute on chronic respiratory failure with hypoxia - improving Etiology acute on chronic diastolic CHF and CAP  Continue O2 to maintain sats over 92% and wean as tolerated   Acute metabolic encephalopathy - resolved Secondary to sepsis from Streptococcus bacteremia Patient is oriented to person and place but not to time Monitor patient closely    Acute on chronic diastolic dysfunction CHF Anasarca  EF 06/2022 showing EF 60 to 65% with grade 1 diastolic dysfunction CT with findings of volume overload, mild pulmonary edema with pleural effusions, small volume pericardial effusion and anasarca and small volume fluid ascites. Net IO Since Admission: -1,570 mL [09/19/22 1259] Nephrology consult for management of fluid status   ESRD on dialysis Nephrology consult for renal replacement therapy   Essential hypertension Uncontrolled Resume home hydral and minoxidil Extra dialysis session tomorrow (unable to meet UF goal today) Bradycardia precludes use  of BB   COPD (chronic obstructive pulmonary disease) Quiescent Continue home inhalers DuoNebs as needed   Schizophrenia Continue Prolixin and carbamazepine Resides at a group home   Vision impairment Precautions for fall     DVT prophylaxis: heparin Pertinent IV fluids/nutrition: no continuous IV fluids, cardiac diet  Central lines / invasive devices: none   Code Status: FULL CODE ACP documentation reviewed: none on file    Current Admission Status: inpatient  TOC needs / Dispo plan: HH PT Barriers to discharge / significant pending items: ongoing w/u of bacteremia     Candice Gobble - guardian [phone] = fax           Subjective / Brief ROS:  No complaints, small bm earlier today   Family Communication:  None at this time    Objective Findings:  Vitals:   09/23/22 1100 09/23/22 1116 09/23/22 1149 09/23/22 1216  BP: (!) 205/79 (!) 172/74  (!) 200/83  Pulse: (!) 47 (!) 55  (!) 48  Resp: 10 11  14   Temp:  98.2 F (36.8 C)  97.6 F (36.4 C)  TempSrc:  Oral  Oral  SpO2: 100% 100%  100%  Weight:   75.1 kg   Height:        Intake/Output Summary (Last 24 hours) at 09/23/2022 1240 Last data filed at 09/23/2022 1116 Gross per 24 hour  Intake 420 ml  Output 1400 ml  Net -980 ml    Filed Weights   09/21/22 1210 09/23/22 0836 09/23/22 1149  Weight: 75.6 kg 76.4 kg 75.1 kg    Examination:  Physical Exam Constitutional:      General: He is not in acute distress. Cardiovascular:     Rate and Rhythm: Normal rate and regular rhythm.  Pulmonary:     Effort: Pulmonary effort is normal.     Breath sounds: Normal breath sounds.  Abdominal:     General: Abdomen is flat.     Palpations: Abdomen is soft.  Musculoskeletal:     Right lower leg: No edema.     Left lower leg: No edema.  Skin:    General: Skin is warm and dry.  Neurological:     General: No focal deficit present.     Mental Status: He is alert. Mental status is at baseline.   Psychiatric:        Behavior: Behavior normal.          Scheduled Medications:   amLODipine  10 mg Oral q morning   calcium acetate  1,334 mg Oral TID WC   carbamazepine  1,000 mg Oral q morning   cholecalciferol  1,000 Units Oral Daily   cloNIDine  0.2 mg Transdermal Weekly   ferrous sulfate  325 mg Oral Daily   fluPHENAZine  2.5 mg Oral Daily   fluticasone furoate-vilanterol  1 puff Inhalation Daily   heparin  5,000 Units Subcutaneous Q8H   hydrALAZINE  50 mg Oral TID   isosorbide mononitrate  60 mg Oral BID   levothyroxine  50 mcg Oral Q0600   minoxidil  2.5 mg Oral Daily   multivitamin  1 tablet Oral QHS   pantoprazole  40 mg Oral Daily   trihexyphenidyl  2 mg Oral BID   zinc sulfate  220 mg Oral Daily    Continuous Infusions:  cefTRIAXone (ROCEPHIN)  IV Stopped (09/22/22 0916)    PRN Medications:  acetaminophen **OR** acetaminophen, albuterol, HYDROcodone-acetaminophen, lactulose, morphine injection, ondansetron **OR** ondansetron (ZOFRAN) IV, polyethylene glycol  Antimicrobials from admission:  Anti-infectives (From admission, onward)    Start     Dose/Rate Route Frequency Ordered Stop   09/16/22 2200  vancomycin (VANCOREADY) IVPB 750 mg/150 mL  Status:  Discontinued        750 mg 150 mL/hr over 60 Minutes Intravenous Every M-W-F (Hemodialysis) 09/15/22 0031 09/15/22 1329   09/15/22 1000  ceFEPIme (MAXIPIME) 2 g in sodium chloride 0.9 % 100 mL IVPB  Status:  Discontinued        2 g 200 mL/hr over 30 Minutes Intravenous Every 12 hours 09/15/22 0031 09/15/22 0734   09/15/22 0800  metroNIDAZOLE (FLAGYL) IVPB 500 mg  Status:  Discontinued        500 mg 100 mL/hr over 60 Minutes Intravenous Every 12 hours 09/14/22 2248 09/15/22 1329   09/15/22 0800  cefTRIAXone (ROCEPHIN) 2 g in sodium chloride 0.9 % 100 mL IVPB        2 g 200 mL/hr over 30 Minutes Intravenous Every 24 hours 09/15/22 0734     09/15/22 0000  vancomycin (VANCOREADY) IVPB 750 mg/150 mL        750  mg 150 mL/hr over 60 Minutes Intravenous  Once 09/14/22 2258 09/15/22 0200   09/14/22 1915  ceFEPIme (MAXIPIME) 2 g in sodium chloride 0.9 % 100 mL IVPB        2 g 200 mL/hr over 30 Minutes Intravenous  Once 09/14/22 1909 09/14/22 1949   09/14/22 1915  metroNIDAZOLE (FLAGYL) IVPB 500 mg        500 mg 100 mL/hr over 60 Minutes Intravenous  Once 09/14/22 1909 09/14/22 2052   09/14/22 1915  vancomycin (VANCOCIN) IVPB 1000 mg/200 mL premix        1,000 mg 200 mL/hr over 60 Minutes Intravenous  Once 09/14/22 1909 09/14/22 2210           Data Reviewed:  I have personally reviewed the following...  CBC: Recent Labs  Lab 09/17/22 0448 09/18/22 1120 09/21/22 0448 09/23/22 0836  WBC 5.1 3.7* 5.0 3.7*  HGB 11.6* 10.7* 11.8* 11.9*  HCT 35.2* 32.4* 35.6* 35.2*  MCV 101.7* 100.9* 99.7 99.2  PLT 112* 154 190 194    Basic Metabolic Panel: Recent Labs  Lab 09/17/22 0448 09/18/22 1120 09/21/22 0448 09/23/22 0836  NA 136 134* 133* 131*  K 3.9 3.9 5.3* 5.2*  CL 96* 96* 97* 95*  CO2 29 29 23 26   GLUCOSE 89 151* 73 74  BUN 29* 29* 54* 49*  CREATININE 5.16* 4.74* 7.63* 7.42*  CALCIUM 8.7* 8.4* 9.3 9.1  PHOS  --  2.5  --  4.4    GFR: Estimated Creatinine Clearance: 11.1 mL/min (A) (by C-G formula based on SCr of 7.42 mg/dL (H)). Liver Function Tests: Recent Labs  Lab 09/18/22 1120 09/23/22 0836  ALBUMIN 2.7* 2.9*    No results for input(s): "LIPASE", "AMYLASE" in the last 168 hours. No results for input(s): "AMMONIA" in the last 168 hours. Coagulation Profile: No results for input(s): "INR", "PROTIME" in the last 168 hours.  Cardiac Enzymes: No results for input(s): "CKTOTAL", "CKMB", "CKMBINDEX", "TROPONINI" in the last 168 hours. BNP (last 3 results) No results for input(s): "PROBNP" in the last 8760 hours. HbA1C: No results for input(s): "HGBA1C" in the last 72 hours. CBG: No results for input(s): "GLUCAP" in the last 168 hours. Lipid Profile: No results for  input(s): "CHOL", "HDL", "LDLCALC", "TRIG", "CHOLHDL", "LDLDIRECT" in the last 72 hours. Thyroid Function Tests: No results for input(s): "TSH", "T4TOTAL", "FREET4", "T3FREE", "THYROIDAB" in the last 72 hours. Anemia Panel: No results for input(s): "VITAMINB12", "FOLATE", "FERRITIN", "TIBC", "IRON", "RETICCTPCT" in the last 72 hours. Most Recent Urinalysis On File:     Component Value Date/Time   COLORURINE YELLOW (A) 05/17/2015 0012   APPEARANCEUR HAZY (A) 05/17/2015 0012   LABSPEC 1.009 05/17/2015 0012   PHURINE 7.0 05/17/2015 0012   GLUCOSEU NEGATIVE 05/17/2015 0012   HGBUR 1+ (A) 05/17/2015 0012   BILIRUBINUR NEGATIVE 05/17/2015 0012   KETONESUR NEGATIVE 05/17/2015 0012   PROTEINUR 30 (A) 05/17/2015 0012   NITRITE NEGATIVE 05/17/2015 0012   LEUKOCYTESUR 3+ (A) 05/17/2015 0012   Sepsis Labs: @LABRCNTIP (procalcitonin:4,lacticidven:4) Microbiology: Recent Results (from the past 240 hour(s))  Resp panel by RT-PCR (RSV, Flu A&B, Covid) Anterior Nasal Swab     Status: None   Collection Time: 09/14/22  7:10 PM   Specimen: Anterior Nasal Swab  Result Value Ref Range Status   SARS Coronavirus 2 by RT PCR NEGATIVE NEGATIVE Final    Comment: (NOTE) SARS-CoV-2 target nucleic acids are NOT DETECTED.  The SARS-CoV-2 RNA is generally detectable in upper respiratory specimens during the acute phase of infection. The lowest concentration of SARS-CoV-2 viral copies this assay can detect is 138 copies/mL. A negative result does not preclude SARS-Cov-2 infection and should not be used as the sole basis for treatment or other patient management decisions. A negative result may  occur with  improper specimen collection/handling, submission of specimen other than nasopharyngeal swab, presence of viral mutation(s) within the areas targeted by this assay, and inadequate number of viral copies(<138 copies/mL). A negative result must be combined with clinical observations, patient history, and  epidemiological information. The expected result is Negative.  Fact Sheet for Patients:  BloggerCourse.com  Fact Sheet for Healthcare Providers:  SeriousBroker.it  This test is no t yet approved or cleared by the [place] FDA and  has been authorized for detection and/or diagnosis of SARS-CoV-2 by FDA under an Emergency Use Authorization (EUA). This EUA will remain  in effect (meaning this test can be used) for the duration of the COVID-19 declaration under Section 564(b)(1) of the Act, 21 U.S.C.section 360bbb-3(b)(1), unless the authorization is terminated  or revoked sooner.       Influenza A by PCR NEGATIVE NEGATIVE Final   Influenza B by PCR NEGATIVE NEGATIVE Final    Comment: (NOTE) The Xpert Xpress SARS-CoV-2/FLU/RSV plus assay is intended as an aid in the diagnosis of influenza from Nasopharyngeal swab specimens and should not be used as a sole basis for treatment. Nasal washings and aspirates are unacceptable for Xpert Xpress SARS-CoV-2/FLU/RSV testing.  Fact Sheet for Patients: BloggerCourse.com  Fact Sheet for Healthcare Providers: SeriousBroker.it  This test is not yet approved or cleared by the [place] FDA and has been authorized for detection and/or diagnosis of SARS-CoV-2 by FDA under an Emergency Use Authorization (EUA). This EUA will remain in effect (meaning this test can be used) for the duration of the COVID-19 declaration under Section 564(b)(1) of the Act, 21 U.S.C. section 360bbb-3(b)(1), unless the authorization is terminated or revoked.     Resp Syncytial Virus by PCR NEGATIVE NEGATIVE Final    Comment: (NOTE) Fact Sheet for Patients: BloggerCourse.com  Fact Sheet for Healthcare Providers: SeriousBroker.it  This test is not yet approved or cleared by the [place] FDA and has been  authorized for detection and/or diagnosis of SARS-CoV-2 by FDA under an Emergency Use Authorization (EUA). This EUA will remain in effect (meaning this test can be used) for the duration of the COVID-19 declaration under Section 564(b)(1) of the Act, 21 U.S.C. section 360bbb-3(b)(1), unless the authorization is terminated or revoked.  Performed at [facility], [address], [place], [place] [address]   Blood Culture (routine x 2)     Status: Abnormal   Collection Time: 09/14/22  7:10 PM   Specimen: BLOOD  Result Value Ref Range Status   Specimen Description   Final    BLOOD BLOOD RIGHT WRIST Performed at [facility], [address], [place], [place] [address]    Special Requests   Final    BOTTLES DRAWN AEROBIC AND ANAEROBIC Blood Culture results may not be optimal due to an inadequate volume of blood received in culture bottles Performed at [facility], [address], [place], [place] [address]    Culture  Setup Time   Final    GRAM POSITIVE COCCI IN BOTH AEROBIC AND ANAEROBIC BOTTLES CRITICAL RESULT CALLED TO, READ BACK BY AND VERIFIED WITH: Albertine Grates 09/15/22 0715 MW Performed at [facility], [address], [place], [place] [address]    Culture STREPTOCOCCUS GALLOLYTICUS (A)  Final   Report Status 09/17/2022 FINAL  Final   Organism ID, Bacteria STREPTOCOCCUS GALLOLYTICUS  Final      Susceptibility   Streptococcus gallolyticus - MIC*    PENICILLIN 0.12 SENSITIVE Sensitive  CEFTRIAXONE <=0.12 SENSITIVE Sensitive     ERYTHROMYCIN >=8 RESISTANT Resistant     LEVOFLOXACIN 2 SENSITIVE Sensitive     VANCOMYCIN 0.5 SENSITIVE Sensitive     * STREPTOCOCCUS GALLOLYTICUS  Blood Culture ID Panel (Reflexed)     Status: Abnormal   Collection Time: 09/14/22  7:10 PM  Result Value Ref Range Status   Enterococcus faecalis NOT DETECTED NOT DETECTED Final   Enterococcus Faecium NOT DETECTED NOT DETECTED Final   Listeria monocytogenes NOT  DETECTED NOT DETECTED Final   Staphylococcus species NOT DETECTED NOT DETECTED Final   Staphylococcus aureus (BCID) NOT DETECTED NOT DETECTED Final   Staphylococcus epidermidis NOT DETECTED NOT DETECTED Final   Staphylococcus lugdunensis NOT DETECTED NOT DETECTED Final   Streptococcus species DETECTED (A) NOT DETECTED Final    Comment: Not Enterococcus species, Streptococcus agalactiae, Streptococcus pyogenes, or Streptococcus pneumoniae. CRITICAL RESULT CALLED TO, READ BACK BY AND VERIFIED WITH: TRAY GREENWOOD 09/15/22 0715 MW    Streptococcus agalactiae NOT DETECTED NOT DETECTED Final   Streptococcus pneumoniae NOT DETECTED NOT DETECTED Final   Streptococcus pyogenes NOT DETECTED NOT DETECTED Final   A.calcoaceticus-baumannii NOT DETECTED NOT DETECTED Final   Bacteroides fragilis NOT DETECTED NOT DETECTED Final   Enterobacterales NOT DETECTED NOT DETECTED Final   Enterobacter cloacae complex NOT DETECTED NOT DETECTED Final   Escherichia coli NOT DETECTED NOT DETECTED Final   Klebsiella aerogenes NOT DETECTED NOT DETECTED Final   Klebsiella oxytoca NOT DETECTED NOT DETECTED Final   Klebsiella pneumoniae NOT DETECTED NOT DETECTED Final   Proteus species NOT DETECTED NOT DETECTED Final   Salmonella species NOT DETECTED NOT DETECTED Final   Serratia marcescens NOT DETECTED NOT DETECTED Final   Haemophilus influenzae NOT DETECTED NOT DETECTED Final   Neisseria meningitidis NOT DETECTED NOT DETECTED Final   Pseudomonas aeruginosa NOT DETECTED NOT DETECTED Final   Stenotrophomonas maltophilia NOT DETECTED NOT DETECTED Final   Candida albicans NOT DETECTED NOT DETECTED Final   Candida auris NOT DETECTED NOT DETECTED Final   Candida glabrata NOT DETECTED NOT DETECTED Final   Candida krusei NOT DETECTED NOT DETECTED Final   Candida parapsilosis NOT DETECTED NOT DETECTED Final   Candida tropicalis NOT DETECTED NOT DETECTED Final   Cryptococcus neoformans/gattii NOT DETECTED NOT DETECTED Final     Comment: Performed at [facility], [address] [place], [place] [address]  Blood Culture (routine x 2)     Status: Abnormal   Collection Time: 09/14/22  7:15 PM   Specimen: BLOOD  Result Value Ref Range Status   Specimen Description   Final    BLOOD RIGHT ANTECUBITAL Performed at [facility], [address] [place], [place] [address]    Special Requests   Final    BOTTLES DRAWN AEROBIC AND ANAEROBIC Blood Culture results may not be optimal due to an inadequate volume of blood received in culture bottles Performed at [facility], [address] [place], [place] [address]    Culture  Setup Time   Final    GRAM POSITIVE COCCI IN BOTH AEROBIC AND ANAEROBIC BOTTLES CRITICAL RESULT CALLED TO, READ BACK BY AND VERIFIED WITH: TRAY GREENWOOD 09/15/22 0715 MW CRITICAL VALUE NOTED.  VALUE IS CONSISTENT WITH PREVIOUSLY REPORTED AND CALLED VALUE.    Culture (A)  Final    STREPTOCOCCUS GALLOLYTICUS SUSCEPTIBILITIES PERFORMED ON PREVIOUS CULTURE WITHIN THE LAST 5 DAYS. Performed at [facility], [address], [place], [place] [address]    Report Status 09/17/2022 FINAL  Final  Culture, blood (Routine X 2) w Reflex to ID Panel     Status: None   Collection Time: 09/17/22  8:49 AM   Specimen: BLOOD  Result Value Ref Range Status   Specimen Description BLOOD RIGHT HAND  Final   Special Requests   Final    BOTTLES DRAWN AEROBIC ONLY Blood Culture adequate volume   Culture   Final    NO GROWTH 5 DAYS Performed at [facility], [address] [name], [place] [address]    Report Status 09/22/2022 FINAL  Final  Culture, blood (Routine X 2) w Reflex to ID Panel     Status: None   Collection Time: 09/17/22  8:49 AM   Specimen: BLOOD  Result Value Ref Range Status   Specimen Description BLOOD LEFT HAND  Final   Special Requests   Final    BOTTLES DRAWN AEROBIC AND ANAEROBIC Blood Culture results may not be optimal due to an excessive  volume of blood received in culture bottles   Culture   Final    NO GROWTH 5 DAYS Performed at [facility], [address], [place], [place] [address]    Report Status 09/22/2022 FINAL  Final  MRSA Next Gen by PCR, Nasal     Status: None   Collection Time: 09/18/22 10:51 PM   Specimen: Nasal Mucosa; Nasal Swab  Result Value Ref Range Status   MRSA by PCR Next Gen NOT DETECTED NOT DETECTED Final    Comment: (NOTE) The GeneXpert MRSA Assay (FDA approved for NASAL specimens only), is one component of a comprehensive MRSA colonization surveillance program. It is not intended to diagnose MRSA infection nor to guide or monitor treatment for MRSA infections. Test performance is not FDA approved in patients less than 62 years old. Performed at [facility], [address], [place], [place] [address]       Radiology Studies last 3 days: ECHO TEE  Result Date: 09/21/2022    TRANSESOPHOGEAL ECHO REPORT   Patient Name:   Todd Brown Date of Exam: 09/21/2022 Medical Rec #:  [id number]        Height:       71.0 in Accession #:    [id number]       Weight:       166.7 lb Date of Birth:  1961-02-03        BSA:          1.951 m Patient Age:    61 years         BP:           184/86 mmHg Patient Gender: M                HR:           52 bpm. Exam Location:  [facility] Procedure: Transesophageal Echo, Cardiac Doppler and Color Doppler Indications:     Bacteremia R78.81  History:         Patient has prior history of Echocardiogram examinations, most                  recent 09/17/2022. COPD; Risk Factors:Hypertension and                  Dyslipidemia.  Sonographer:     Cristela Blue Referring Phys:  [id number] Raymon Mutton DUNN Diagnosing Phys: Debbe Odea MD PROCEDURE: The transesophogeal probe was passed without difficulty through the esophogus of the patient. Sedation performed by performing physician. The  patient developed no complications during the procedure.  IMPRESSIONS  1. Left ventricular  ejection fraction, by estimation, is 50 to 55%. The left ventricle has low normal function.  2. Right ventricular systolic function is low normal. The right ventricular size is moderately enlarged.  3. Left atrial size was mild to moderately dilated. No left atrial/left atrial appendage thrombus was detected.  4. Right atrial size was moderately dilated.  5. A small pericardial effusion is present. The pericardial effusion is circumferential. There is no evidence of cardiac tamponade.  6. The mitral valve is normal in structure. Mild mitral valve regurgitation.  7. Tricuspid valve regurgitation is mild to moderate.  8. The aortic valve is tricuspid. Aortic valve regurgitation is mild. Conclusion(s)/Recommendation(s): No evidence of vegetation/infective endocarditis on this transesophageael echocardiogram. FINDINGS  Left Ventricle: Left ventricular ejection fraction, by estimation, is 50 to 55%. The left ventricle has low normal function. The left ventricular internal cavity size was normal in size. Right Ventricle: The right ventricular size is moderately enlarged. No increase in right ventricular wall thickness. Right ventricular systolic function is low normal. Left Atrium: Left atrial size was mild to moderately dilated. No left atrial/left atrial appendage thrombus was detected. Right Atrium: Right atrial size was moderately dilated. Pericardium: A small pericardial effusion is present. The pericardial effusion is circumferential. There is no evidence of cardiac tamponade. Mitral Valve: The mitral valve is normal in structure. Mild mitral valve regurgitation. Tricuspid Valve: The tricuspid valve is normal in structure. Tricuspid valve regurgitation is mild to moderate. Aortic Valve: The aortic valve is tricuspid. Aortic valve regurgitation is mild. Pulmonic Valve: The pulmonic valve was normal in structure. Pulmonic valve regurgitation is not visualized. Aorta: The aortic root is normal in size and structure.  IAS/Shunts: No atrial level shunt detected by color flow Doppler. Debbe Odea MD Electronically signed by Debbe Odea MD Signature Date/Time: 09/21/2022/2:17:54 PM    Final       LOS: 9 days      Silvano Bilis, MD [facility] 09/23/2022, 12:40 PM    Staff may message me via secure chat in Epic  but this may not receive an immediate response,  please page me for urgent matters!  If 7PM-7AM, please contact night coverage [email or web address]

## 2022-09-23 NOTE — Progress Notes (Signed)
  Received patient in bed to unit.   Informed consent signed and in chart.    TX duration: 2.38  taken off early d/t imaging procedure     Transported back to floor  Hand-off given to patient's nurse.    Access used: LAVF Access issues: none   Total UF removed: 1.4l Medication(s) given:  hydralazine Post HD VS 172/74:  Post HD weight: 75.1kg     Lynann Beaver  [facility]

## 2022-09-23 NOTE — Progress Notes (Signed)
[facility]  ROUNDING NOTE   Subjective:   Todd Brown is a 62 year old male with past medical conditions including COPD, hypertension, DVT, schizophrenia, and end-stage renal disease on hemodialysis.  Patient presents to the emergency department with lethargy.   Patient is known to our practice and receives outpatient dialysis treatments at [facility] on a MWF schedule, supervised by Dr. Cherylann Ratel.   Patient seen and evaluated during dialysis   HEMODIALYSIS FLOWSHEET:  Blood Flow Rate (mL/min): 400 mL/min Arterial Pressure (mmHg): -190 mmHg Venous Pressure (mmHg): 210 mmHg TMP (mmHg): 8 mmHg Ultrafiltration Rate (mL/min): 765 mL/min Dialysate Flow Rate (mL/min): 300 ml/min  Patient alert and oriented Blood pressure elevated  Objective:  Vital signs in last 24 hours:  Temp:  [97.6 F (36.4 C)-98.1 F (36.7 C)] 97.6 F (36.4 C) (04/24 0820) Pulse Rate:  [47-55] 55 (04/24 1116) Resp:  [0-18] 11 (04/24 1116) BP: (135-208)/(76-92) 205/79 (04/24 1100) SpO2:  [97 %-100 %] 100 % (04/24 1116) Weight:  [76.4 kg] 76.4 kg (04/24 0836)  Weight change:  Filed Weights   09/21/22 1153 09/21/22 1210 09/23/22 0836  Weight: 75.6 kg 75.6 kg 76.4 kg    Intake/Output: I/O last 3 completed shifts: In: 420 [P.O.:120; IV Piggyback:300] Out: -    Intake/Output this shift:  No intake/output data recorded.  Physical Exam: General: NAD  Head: Normocephalic, atraumatic. Moist oral mucosal membranes  Eyes: Anicteric  Lungs:  Diminished in bases, normal effort, [place] O2  Heart: Regular rate and rhythm  Abdomen:  Soft, nontender  Extremities:  No peripheral edema.  Neurologic: Alert and oriented  Skin: No lesions  Access: Lt avf    Basic Metabolic Panel: Recent Labs  Lab 09/17/22 0448 09/18/22 1120 09/21/22 0448 09/23/22 0836  NA 136 134* 133* 131*  K 3.9 3.9 5.3* 5.2*  CL 96* 96* 97* 95*  CO2 29 29 23 26   GLUCOSE 89 151* 73 74  BUN 29* 29* 54* 49*   CREATININE 5.16* 4.74* 7.63* 7.42*  CALCIUM 8.7* 8.4* 9.3 9.1  PHOS  --  2.5  --  4.4     Liver Function Tests: Recent Labs  Lab 09/18/22 1120 09/23/22 0836  ALBUMIN 2.7* 2.9*    No results for input(s): "LIPASE", "AMYLASE" in the last 168 hours. No results for input(s): "AMMONIA" in the last 168 hours.  CBC: Recent Labs  Lab 09/17/22 0448 09/18/22 1120 09/21/22 0448 09/23/22 0836  WBC 5.1 3.7* 5.0 3.7*  HGB 11.6* 10.7* 11.8* 11.9*  HCT 35.2* 32.4* 35.6* 35.2*  MCV 101.7* 100.9* 99.7 99.2  PLT 112* 154 190 194     Cardiac Enzymes: No results for input(s): "CKTOTAL", "CKMB", "CKMBINDEX", "TROPONINI" in the last 168 hours.  BNP: Invalid input(s): "POCBNP"  CBG: No results for input(s): "GLUCAP" in the last 168 hours.  Microbiology: Results for orders placed or performed during the hospital encounter of 09/14/22  Resp panel by RT-PCR (RSV, Flu A&B, Covid) Anterior Nasal Swab     Status: None   Collection Time: 09/14/22  7:10 PM   Specimen: Anterior Nasal Swab  Result Value Ref Range Status   SARS Coronavirus 2 by RT PCR NEGATIVE NEGATIVE Final    Comment: (NOTE) SARS-CoV-2 target nucleic acids are NOT DETECTED.  The SARS-CoV-2 RNA is generally detectable in upper respiratory specimens during the acute phase of infection. The lowest concentration of SARS-CoV-2 viral copies this assay can detect is 138 copies/mL. A negative result does not preclude SARS-Cov-2 infection and should not  be used as the sole basis for treatment or other patient management decisions. A negative result may occur with  improper specimen collection/handling, submission of specimen other than nasopharyngeal swab, presence of viral mutation(s) within the areas targeted by this assay, and inadequate number of viral copies(<138 copies/mL). A negative result must be combined with clinical observations, patient history, and epidemiological information. The expected result is  Negative.  Fact Sheet for Patients:  BloggerCourse.com  Fact Sheet for Healthcare Providers:  SeriousBroker.it  This test is no t yet approved or cleared by the [place] FDA and  has been authorized for detection and/or diagnosis of SARS-CoV-2 by FDA under an Emergency Use Authorization (EUA). This EUA will remain  in effect (meaning this test can be used) for the duration of the COVID-19 declaration under Section 564(b)(1) of the Act, 21 U.S.C.section 360bbb-3(b)(1), unless the authorization is terminated  or revoked sooner.       Influenza A by PCR NEGATIVE NEGATIVE Final   Influenza B by PCR NEGATIVE NEGATIVE Final    Comment: (NOTE) The Xpert Xpress SARS-CoV-2/FLU/RSV plus assay is intended as an aid in the diagnosis of influenza from Nasopharyngeal swab specimens and should not be used as a sole basis for treatment. Nasal washings and aspirates are unacceptable for Xpert Xpress SARS-CoV-2/FLU/RSV testing.  Fact Sheet for Patients: BloggerCourse.com  Fact Sheet for Healthcare Providers: SeriousBroker.it  This test is not yet approved or cleared by the [place] FDA and has been authorized for detection and/or diagnosis of SARS-CoV-2 by FDA under an Emergency Use Authorization (EUA). This EUA will remain in effect (meaning this test can be used) for the duration of the COVID-19 declaration under Section 564(b)(1) of the Act, 21 U.S.C. section 360bbb-3(b)(1), unless the authorization is terminated or revoked.     Resp Syncytial Virus by PCR NEGATIVE NEGATIVE Final    Comment: (NOTE) Fact Sheet for Patients: BloggerCourse.com  Fact Sheet for Healthcare Providers: SeriousBroker.it  This test is not yet approved or cleared by the [place] FDA and has been authorized for detection and/or diagnosis of  SARS-CoV-2 by FDA under an Emergency Use Authorization (EUA). This EUA will remain in effect (meaning this test can be used) for the duration of the COVID-19 declaration under Section 564(b)(1) of the Act, 21 U.S.C. section 360bbb-3(b)(1), unless the authorization is terminated or revoked.  Performed at [facility], [address] [place], [place] [address]   Blood Culture (routine x 2)     Status: Abnormal   Collection Time: 09/14/22  7:10 PM   Specimen: BLOOD  Result Value Ref Range Status   Specimen Description   Final    BLOOD BLOOD RIGHT WRIST Performed at [facility], [address], [place], [place] [address]    Special Requests   Final    BOTTLES DRAWN AEROBIC AND ANAEROBIC Blood Culture results may not be optimal due to an inadequate volume of blood received in culture bottles Performed at [facility], [address] [place], [place] [address]    Culture  Setup Time   Final    GRAM POSITIVE COCCI IN BOTH AEROBIC AND ANAEROBIC BOTTLES CRITICAL RESULT CALLED TO, READ BACK BY AND VERIFIED WITH: Albertine Grates 09/15/22 0715 MW Performed at [facility], [address], [place], [place] [address]    Culture STREPTOCOCCUS GALLOLYTICUS (A)  Final   Report Status 09/17/2022 FINAL  Final   Organism ID, Bacteria STREPTOCOCCUS GALLOLYTICUS  Final  Susceptibility   Streptococcus gallolyticus - MIC*    PENICILLIN 0.12 SENSITIVE Sensitive     CEFTRIAXONE <=0.12 SENSITIVE Sensitive     ERYTHROMYCIN >=8 RESISTANT Resistant     LEVOFLOXACIN 2 SENSITIVE Sensitive     VANCOMYCIN 0.5 SENSITIVE Sensitive     * STREPTOCOCCUS GALLOLYTICUS  Blood Culture ID Panel (Reflexed)     Status: Abnormal   Collection Time: 09/14/22  7:10 PM  Result Value Ref Range Status   Enterococcus faecalis NOT DETECTED NOT DETECTED Final   Enterococcus Faecium NOT DETECTED NOT DETECTED Final   Listeria monocytogenes NOT DETECTED NOT DETECTED Final   Staphylococcus  species NOT DETECTED NOT DETECTED Final   Staphylococcus aureus (BCID) NOT DETECTED NOT DETECTED Final   Staphylococcus epidermidis NOT DETECTED NOT DETECTED Final   Staphylococcus lugdunensis NOT DETECTED NOT DETECTED Final   Streptococcus species DETECTED (A) NOT DETECTED Final    Comment: Not Enterococcus species, Streptococcus agalactiae, Streptococcus pyogenes, or Streptococcus pneumoniae. CRITICAL RESULT CALLED TO, READ BACK BY AND VERIFIED WITH: TRAY GREENWOOD 09/15/22 0715 MW    Streptococcus agalactiae NOT DETECTED NOT DETECTED Final   Streptococcus pneumoniae NOT DETECTED NOT DETECTED Final   Streptococcus pyogenes NOT DETECTED NOT DETECTED Final   A.calcoaceticus-baumannii NOT DETECTED NOT DETECTED Final   Bacteroides fragilis NOT DETECTED NOT DETECTED Final   Enterobacterales NOT DETECTED NOT DETECTED Final   Enterobacter cloacae complex NOT DETECTED NOT DETECTED Final   Escherichia coli NOT DETECTED NOT DETECTED Final   Klebsiella aerogenes NOT DETECTED NOT DETECTED Final   Klebsiella oxytoca NOT DETECTED NOT DETECTED Final   Klebsiella pneumoniae NOT DETECTED NOT DETECTED Final   Proteus species NOT DETECTED NOT DETECTED Final   Salmonella species NOT DETECTED NOT DETECTED Final   Serratia marcescens NOT DETECTED NOT DETECTED Final   Haemophilus influenzae NOT DETECTED NOT DETECTED Final   Neisseria meningitidis NOT DETECTED NOT DETECTED Final   Pseudomonas aeruginosa NOT DETECTED NOT DETECTED Final   Stenotrophomonas maltophilia NOT DETECTED NOT DETECTED Final   Candida albicans NOT DETECTED NOT DETECTED Final   Candida auris NOT DETECTED NOT DETECTED Final   Candida glabrata NOT DETECTED NOT DETECTED Final   Candida krusei NOT DETECTED NOT DETECTED Final   Candida parapsilosis NOT DETECTED NOT DETECTED Final   Candida tropicalis NOT DETECTED NOT DETECTED Final   Cryptococcus neoformans/gattii NOT DETECTED NOT DETECTED Final    Comment: Performed at [facility], [address] [place], [place] [address]  Blood Culture (routine x 2)     Status: Abnormal   Collection Time: 09/14/22  7:15 PM   Specimen: BLOOD  Result Value Ref Range Status   Specimen Description   Final    BLOOD RIGHT ANTECUBITAL Performed at [facility], [address] [place], [place] [address]    Special Requests   Final    BOTTLES DRAWN AEROBIC AND ANAEROBIC Blood Culture results may not be optimal due to an inadequate volume of blood received in culture bottles Performed at [facility], [address] [place], [place] [address]    Culture  Setup Time   Final    GRAM POSITIVE COCCI IN BOTH AEROBIC AND ANAEROBIC BOTTLES CRITICAL RESULT CALLED TO, READ BACK BY AND VERIFIED WITH: TRAY GREENWOOD 09/15/22 0715 MW CRITICAL VALUE NOTED.  VALUE IS CONSISTENT WITH PREVIOUSLY REPORTED AND CALLED VALUE.    Culture (A)  Final    STREPTOCOCCUS GALLOLYTICUS SUSCEPTIBILITIES PERFORMED ON PREVIOUS CULTURE WITHIN THE LAST 5 DAYS. Performed at [name], [address], [place], [place] [address]    Report Status 09/17/2022 FINAL  Final  Culture, blood (Routine X 2) w Reflex to ID Panel     Status: None   Collection Time: 09/17/22  8:49 AM   Specimen: BLOOD  Result Value Ref Range Status   Specimen Description BLOOD RIGHT HAND  Final   Special Requests   Final    BOTTLES DRAWN AEROBIC ONLY Blood Culture adequate volume   Culture   Final    NO GROWTH 5 DAYS Performed at [facility], [address] [place], [place] [address]    Report Status 09/22/2022 FINAL  Final  Culture, blood (Routine X 2) w Reflex to ID Panel     Status: None   Collection Time: 09/17/22  8:49 AM   Specimen: BLOOD  Result Value Ref Range Status   Specimen Description BLOOD LEFT HAND  Final   Special Requests   Final    BOTTLES DRAWN AEROBIC AND ANAEROBIC Blood Culture results may not be optimal due to an excessive volume of blood received in culture bottles    Culture   Final    NO GROWTH 5 DAYS Performed at [facility], [address] [place], [place] [address]    Report Status 09/22/2022 FINAL  Final  MRSA Next Gen by PCR, Nasal     Status: None   Collection Time: 09/18/22 10:51 PM   Specimen: Nasal Mucosa; Nasal Swab  Result Value Ref Range Status   MRSA by PCR Next Gen NOT DETECTED NOT DETECTED Final    Comment: (NOTE) The GeneXpert MRSA Assay (FDA approved for NASAL specimens only), is one component of a comprehensive MRSA colonization surveillance program. It is not intended to diagnose MRSA infection nor to guide or monitor treatment for MRSA infections. Test performance is not FDA approved in patients less than 30 years old. Performed at [facility], [address] [place], [place] [address]     Coagulation Studies: No results for input(s): "LABPROT", "INR" in the last 72 hours.   Urinalysis: No results for input(s): "COLORURINE", "LABSPEC", "PHURINE", "GLUCOSEU", "HGBUR", "BILIRUBINUR", "KETONESUR", "PROTEINUR", "UROBILINOGEN", "NITRITE", "LEUKOCYTESUR" in the last 72 hours.  Invalid input(s): "APPERANCEUR"    Imaging: ECHO TEE  Result Date: 09/21/2022    TRANSESOPHOGEAL ECHO REPORT   Patient Name:   Todd Brown Date of Exam: 09/21/2022 Medical Rec #:  [id number]        Height:       71.0 in Accession #:    [id number]       Weight:       166.7 lb Date of Birth:  02/12/1961        BSA:          1.951 m Patient Age:    61 years         BP:           184/86 mmHg Patient Gender: M                HR:           52 bpm. Exam Location:  [facility] Procedure: Transesophageal Echo, Cardiac Doppler and Color Doppler Indications:     Bacteremia R78.81  History:         Patient has prior history of Echocardiogram examinations, most                  recent 09/17/2022. COPD; Risk Factors:Hypertension and  Dyslipidemia.  Sonographer:     Cristela Blue Referring Phys:  [id number] Raymon Mutton DUNN Diagnosing Phys: Debbe Odea MD PROCEDURE: The transesophogeal probe was passed without difficulty through the esophogus of the patient. Sedation performed by performing physician. The patient developed no complications during the procedure.  IMPRESSIONS  1. Left ventricular ejection fraction, by estimation, is 50 to 55%. The left ventricle has low normal function.  2. Right ventricular systolic function is low normal. The right ventricular size is moderately enlarged.  3. Left atrial size was mild to moderately dilated. No left atrial/left atrial appendage thrombus was detected.  4. Right atrial size was moderately dilated.  5. A small pericardial effusion is present. The pericardial effusion is circumferential. There is no evidence of cardiac tamponade.  6. The mitral valve is normal in structure. Mild mitral valve regurgitation.  7. Tricuspid valve regurgitation is mild to moderate.  8. The aortic valve is tricuspid. Aortic valve regurgitation is mild. Conclusion(s)/Recommendation(s): No evidence of vegetation/infective endocarditis on this transesophageael echocardiogram. FINDINGS  Left Ventricle: Left ventricular ejection fraction, by estimation, is 50 to 55%. The left ventricle has low normal function. The left ventricular internal cavity size was normal in size. Right Ventricle: The right ventricular size is moderately enlarged. No increase in right ventricular wall thickness. Right ventricular systolic function is low normal. Left Atrium: Left atrial size was mild to moderately dilated. No left atrial/left atrial appendage thrombus was detected. Right Atrium: Right atrial size was moderately dilated. Pericardium: A small pericardial effusion is present. The pericardial effusion is circumferential. There is no evidence of cardiac tamponade. Mitral Valve: The mitral valve is normal in structure. Mild mitral valve regurgitation. Tricuspid Valve: The tricuspid valve is normal in structure. Tricuspid valve regurgitation is mild  to moderate. Aortic Valve: The aortic valve is tricuspid. Aortic valve regurgitation is mild. Pulmonic Valve: The pulmonic valve was normal in structure. Pulmonic valve regurgitation is not visualized. Aorta: The aortic root is normal in size and structure. IAS/Shunts: No atrial level shunt detected by color flow Doppler. Debbe Odea MD Electronically signed by Debbe Odea MD Signature Date/Time: 09/21/2022/2:17:54 PM    Final      Medications:    cefTRIAXone (ROCEPHIN)  IV Stopped (09/22/22 0916)    amLODipine  10 mg Oral q morning   calcium acetate  1,334 mg Oral TID WC   carbamazepine  1,000 mg Oral q morning   cholecalciferol  1,000 Units Oral Daily   cloNIDine  0.2 mg Transdermal Weekly   ferrous sulfate  325 mg Oral Daily   fluPHENAZine  2.5 mg Oral Daily   fluticasone furoate-vilanterol  1 puff Inhalation Daily   heparin  5,000 Units Subcutaneous Q8H   hydrALAZINE  10 mg Oral Q8H   isosorbide mononitrate  60 mg Oral BID   levothyroxine  50 mcg Oral Q0600   multivitamin  1 tablet Oral QHS   pantoprazole  40 mg Oral Daily   trihexyphenidyl  2 mg Oral BID   zinc sulfate  220 mg Oral Daily   acetaminophen **OR** acetaminophen, albuterol, HYDROcodone-acetaminophen, lactulose, morphine injection, ondansetron **OR** ondansetron (ZOFRAN) IV, polyethylene glycol  Assessment/ Plan:  Mr. NICKOLUS WADDING is a 61 y.o.  male is a 62 year old male with past medical conditions including COPD, hypertension, DVT, schizophrenia, and end-stage renal disease on hemodialysis.  Patient presents to the emergency department with lethargy  [facility]/MWF/left aVF   Fluid volume overload with end stage renal disease on hemodialysis. CT chest  abd pelvis show fluid overload with mild pulmonary edema and pleural effusions.    Patient receiving dialysis today, UF goal 2 L.  Due to scheduled procedure, patient treatment terminated early.  Due to the to shorten treatments this week, we  will plan to dialyze patient tomorrow.  2. Anemia of chronic kidney disease Lab Results  Component Value Date   HGB 11.9 (L) 09/23/2022    Hemoglobin remains within desired range.  3. Secondary Hyperparathyroidism: with outpatient labs: PTH 459, phosphorus 3.4, calcium 9.7 on 06/08/22 .   Lab Results  Component Value Date   PTH 194 (H) 05/11/2017   CALCIUM 9.1 09/23/2022   CAION 1.19 03/27/2022   PHOS 4.4 09/23/2022  Calcium of service within optimal target. Continue cholecalciferol and calcium acetate.  4.  Hypertension with chronic kidney disease.  Home regimen includes amlodipine, clonidine, hydralazine, isosorbide, doxazosin and minoxidil.  All currently held at this time.  Blood pressure elevated during dialysis.  Patient received one-time dose hydralazine 10 mg IV.   LOS: 9   4/24/202411:26 AM

## 2022-09-23 NOTE — Progress Notes (Signed)
Occupational Therapy Treatment Patient Details Name: Todd Brown MRN: [id number] DOB: 1961-05-19 Today's Date: 09/23/2022   History of present illness Pt is a 62 y.o. 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 MD assessment includes: severe sepsis, community-acquired pneumonia, streptococcus bacteremia, acute on chronic respiratory failure with hypoxia, acute metabolic encephalopathy, acute on chronic diastolic dysfunction CHF, and anasarca.   OT comments  Todd Brown was seen for OT treatment on this date. Upon arrival to pt room, pt semi-fowlers in bed requesting food (lunch tray arrives during session), pt eager to get OOB to chair to eat lunch. Pt able to come to sitting at EOB with multimodal cueing for sequencing but no physical assist. Pt maintains steady static sitting balance at EOB with VSS (pulse 91, SpO2 94% on 2L [place]). At this point, hospital staff arrive to take pt to time sensitive procedure. Pt able to return to supine in bed with supervision for safety. Session concluded. Pt progressing toward OT goals and continues to benefit from skilled OT services to maximize return to PLOF and minimize risk of future falls, injury, caregiver burden, and readmission. Will continue to follow POC as written.     Recommendations for follow up therapy are one component of a multi-disciplinary discharge planning process, led by the attending physician.  Recommendations may be updated based on patient status, additional functional criteria and insurance authorization.    Assistance Recommended at Discharge Frequent or constant Supervision/Assistance  Patient can return home with the following  A little help  with walking and/or transfers;A little help with bathing/dressing/bathroom;Assistance with cooking/housework;Assist for transportation;Help with stairs or ramp for entrance   Equipment Recommendations  None recommended by OT    Recommendations for Other Services      Precautions / Restrictions Precautions Precautions: Fall Restrictions Weight Bearing Restrictions: No       Mobility Bed Mobility Overal bed mobility: Needs Assistance Bed Mobility: Supine to Sit, Sit to Supine     Supine to sit: Supervision, HOB elevated     General bed mobility comments: No physical assist for sup>sit however pt requires multimodal cueing to complete bed mobility during session.    Transfers                   General transfer comment: Deferred. Hospital transport team in room to take pt to time-sensitive proceedure. Session concluded at this time.     Balance Overall balance assessment: Needs assistance Sitting-balance support: Feet supported, Bilateral upper extremity supported Sitting balance-[name]: Good Sitting balance - Comments: steady static sitting, reaching inside BOS.                                   ADL either performed or assessed with clinical judgement   ADL Overall ADL's : Needs assistance/impaired                                       General ADL Comments: Min A for bed mobility, limited LB ADL management (adjusts socks). Anticipate MIN-MOD A For more exertional ADL management with baseline SET UP assist 2/2 low  vision.    Extremity/Trunk Assessment Upper Extremity Assessment Upper Extremity Assessment: Generalized weakness   Lower Extremity Assessment Lower Extremity Assessment: Generalized weakness        Vision Baseline Vision/History: 2 Legally blind Patient Visual Report: No change from baseline     Perception     Praxis      Cognition Arousal/Alertness: Awake/alert Behavior During Therapy: WFL for tasks  assessed/performed Overall Cognitive Status: History of cognitive impairments - at baseline                                 General Comments: Pt oriented to self, perseverates on getting something to eat during session. Per chart, seems near baseline level of cognition.        Exercises Other Exercises Other Exercises: Pt educated on safety, bed mobility techniques, and falls prevention during ADL as described above.    Shoulder Instructions       General Comments      Pertinent Vitals/ Pain       Pain Assessment Pain Assessment: No/denies pain  Home Living                                          Prior Functioning/Environment              Frequency  Min 1X/week        Progress Toward Goals  OT Goals(current goals can now be found in the care plan section)  Progress towards OT goals: Progressing toward goals  Acute Rehab OT Goals Patient Stated Goal: To get something to eat. OT Goal Formulation: With patient Time For Goal Achievement: 10/01/22 Potential to Achieve Goals: Fair  Plan Discharge plan remains appropriate;Frequency remains appropriate    Co-evaluation                 AM-PAC OT "6 Clicks" Daily Activity     Outcome Measure   Help from another person eating meals?: None Help from another person taking care of personal grooming?: A Little Help from another person toileting, which includes using toliet, bedpan, or urinal?: A Little Help from another person bathing (including washing, rinsing, drying)?: A Little Help from another person to put on and taking off regular upper body clothing?: A Little Help from another person to put on and taking off regular lower body clothing?: A Little 6 Click Score: 19    End of Session Equipment Utilized During Treatment: Oxygen  OT Visit Diagnosis: Unsteadiness on feet (R26.81);Muscle weakness (generalized) (M62.81)   Activity Tolerance Patient tolerated treatment  well   Patient Left in bed;with call bell/phone within reach;Other (comment) (with transport staff in room to take pt to proceedure.)   Nurse Communication Mobility status        Time: 1300-1309 OT Time Calculation (min): 9 min  Charges: OT General Charges $OT Visit: 1 Visit OT Treatments $Self Care/Home Management : 8-22 mins  Rockney Ghee, M.S., OTR/L 09/23/22, 2:04 PM

## 2022-09-24 DIAGNOSIS — R7881 Bacteremia: Secondary | ICD-10-CM | POA: Diagnosis not present

## 2022-09-24 LAB — CBC
HCT: 38.5 % — ABNORMAL LOW (ref 39.0–52.0)
Hemoglobin: 12.9 g/dL — ABNORMAL LOW (ref 13.0–17.0)
MCH: 33.2 pg (ref 26.0–34.0)
MCHC: 33.5 g/dL (ref 30.0–36.0)
MCV: 99.2 fL (ref 80.0–100.0)
Platelets: 156 10*3/uL (ref 150–400)
RBC: 3.88 MIL/uL — ABNORMAL LOW (ref 4.22–5.81)
RDW: 13.9 % (ref 11.5–15.5)
WBC: 3.7 10*3/uL — ABNORMAL LOW (ref 4.0–10.5)
nRBC: 0 % (ref 0.0–0.2)

## 2022-09-24 LAB — RENAL FUNCTION PANEL
Albumin: 2.6 g/dL — ABNORMAL LOW (ref 3.5–5.0)
Anion gap: 10 (ref 5–15)
BUN: 42 mg/dL — ABNORMAL HIGH (ref 8–23)
CO2: 26 mmol/L (ref 22–32)
Calcium: 8.9 mg/dL (ref 8.9–10.3)
Chloride: 96 mmol/L — ABNORMAL LOW (ref 98–111)
Creatinine, Ser: 6.4 mg/dL — ABNORMAL HIGH (ref 0.61–1.24)
GFR, Estimated: 9 mL/min — ABNORMAL LOW (ref >60)
Glucose, Bld: 72 mg/dL (ref 70–99)
Phosphorus: 3.7 mg/dL (ref 2.5–4.6)
Potassium: 5.1 mmol/L (ref 3.5–5.1)
Sodium: 132 mmol/L — ABNORMAL LOW (ref 135–145)

## 2022-09-24 MED ORDER — CEFAZOLIN IV (FOR PTA / DISCHARGE USE ONLY)
2.0000 g | INTRAVENOUS | 0 refills | Status: AC
Start: 2022-09-25 — End: 2022-10-14

## 2022-09-24 NOTE — NC FL2 (Signed)
[place] MEDICAID FL2 LEVEL OF CARE FORM     IDENTIFICATION  Patient Name: Todd Brown Birthdate: [birth year] Sex: male Admission Date (Current Location): 09/14/2022  [facility] and [place] Number:  Chiropodist and Address:         Provider Number: [id number]  Attending Physician Name and Address:  Kathrynn Running, MD  Relative Name and Phone Number:       Current Level of Care: Hospital Recommended Level of Care: Assisted Living Facility Prior Approval Number:    Date Approved/Denied:   PASRR Number:    Discharge Plan: Other (Comment) (alf)    Current Diagnoses: Patient Active Problem List   Diagnosis Date Noted   CAP (community acquired pneumonia) 09/14/2022   Anasarca (mild pulmonary edema, pleural and pericardial effusion, ascites on CT) 09/14/2022   Acute on chronic respiratory failure 09/14/2022   Bacteremia 06/20/2022   Acute encephalopathy 06/19/2022   SIRS (systemic inflammatory response syndrome) 06/19/2022   NSTEMI (non-ST elevated myocardial infarction) 06/19/2022   Acute on chronic respiratory failure with hypoxia 05/21/2022   Hypoxia 05/20/2022   COVID-19 virus infection 05/20/2022   Hyponatremia 02/10/2022   Anemia due to chronic kidney disease 01/21/2022   Hyperkalemia 01/14/2022   Malnutrition of moderate degree 12/24/2021   Oral thrush 12/22/2021   Physical deconditioning 12/20/2021   Acute metabolic encephalopathy 12/15/2021   Essential hypertension 12/15/2021   ESRD on hemodialysis 12/15/2021   Altered mental status 10/08/2021   Hypertensive urgency 10/07/2021   COPD (chronic obstructive pulmonary disease) 02/26/2021   GERD (gastroesophageal reflux disease) 02/26/2021   Hypertension 11/28/2020   Acute respiratory failure with hypoxia 10/23/2019   Anemia of chronic disease 10/23/2019   Fluid overload 10/08/2018   Acute pulmonary edema 09/12/2018   Scrotal edema 05/10/2017   Symptomatic anemia 04/30/2017   Anticoagulated  on Coumadin 09/14/2016   Cardiac arrest 08/03/2016   Severe sepsis versus SIRS 05/26/2016   Dialysis patient 04/15/2016   End stage renal disease 02/12/2016   Hyperkalemia 05/20/2015   Hepatitis C 09/26/2013   Schizophrenia 03/23/2013   Essential hypertension, benign 03/23/2013   Chronic kidney disease 03/23/2013    Orientation RESPIRATION BLADDER Height & Weight     Self  O2 (2L [place]) Continent Weight: 75.4 kg Height:   (180.3 cm)  BEHAVIORAL SYMPTOMS/MOOD NEUROLOGICAL BOWEL NUTRITION STATUS      Incontinent Diet (low sodium heart healthy)  AMBULATORY STATUS COMMUNICATION OF NEEDS Skin   Extensive Assist Verbally Normal                       Personal Care Assistance Level of Assistance              Functional Limitations Info             SPECIAL CARE FACTORS FREQUENCY  PT (By licensed PT), OT (By licensed OT)     PT Frequency: [facility] OT Frequency: [facility]            Contractures Contractures Info: Not present    Additional Factors Info  Code Status, Allergies Code Status Info: Full Allergies Info: Chlorpromazine           Current Medications (09/24/2022):  This is the current hospital active medication list Current Facility-Administered Medications  Medication Dose Route Frequency Provider Last Rate Last Admin   acetaminophen (TYLENOL) tablet 650 mg  650 mg Oral Q6H PRN Andris Baumann, MD   650 mg at 09/23/22 1478   Or  acetaminophen (TYLENOL) suppository 650 mg  650 mg Rectal Q6H PRN Andris Baumann, MD       albuterol (PROVENTIL) (2.5 MG/3ML) 0.083% nebulizer solution 3 mL  3 mL Inhalation Q4H PRN Sunnie Nielsen, DO       amLODipine (NORVASC) tablet 10 mg  10 mg Oral q morning Sunnie Nielsen, DO   10 mg at 09/24/22 1233   calcium acetate (PHOSLO) capsule 1,334 mg  1,334 mg Oral TID WC Andris Baumann, MD   1,334 mg at 09/24/22 1233   carbamazepine (TEGRETOL) tablet 1,000 mg  1,000 mg Oral q morning Lindajo Royal V, MD   1,000  mg at 09/24/22 1234   cefTRIAXone (ROCEPHIN) 2 g in sodium chloride 0.9 % 100 mL IVPB  2 g Intravenous Q24H Agbata, Tochukwu, MD 200 mL/hr at 09/24/22 1246 2 g at 09/24/22 1246   cholecalciferol (VITAMIN D3) 25 MCG (1000 UNIT) tablet 1,000 Units  1,000 Units Oral Daily Sunnie Nielsen, DO   1,000 Units at 09/24/22 1233   cloNIDine (CATAPRES - Dosed in mg/24 hr) patch 0.2 mg  0.2 mg Transdermal Weekly Sunnie Nielsen, DO   0.2 mg at 09/24/22 1235   ferrous sulfate tablet 325 mg  325 mg Oral Daily Sunnie Nielsen, DO   325 mg at 09/24/22 1233   fluPHENAZine (PROLIXIN) tablet 2.5 mg  2.5 mg Oral Daily Lindajo Royal V, MD   2.5 mg at 09/24/22 1234   fluticasone furoate-vilanterol (BREO ELLIPTA) 200-25 MCG/ACT 1 puff  1 puff Inhalation Daily Sunnie Nielsen, DO   1 puff at 09/24/22 1236   heparin injection 5,000 Units  5,000 Units Subcutaneous Q8H Andris Baumann, MD   5,000 Units at 09/24/22 0533   hydrALAZINE (APRESOLINE) tablet 50 mg  50 mg Oral TID Kathrynn Running, MD   50 mg at 09/24/22 1233   HYDROcodone-acetaminophen (NORCO/VICODIN) 5-325 MG per tablet 1-2 tablet  1-2 tablet Oral Q4H PRN Andris Baumann, MD   1 tablet at 09/24/22 0146   isosorbide mononitrate (IMDUR) 24 hr tablet 60 mg  60 mg Oral BID Sunnie Nielsen, DO   60 mg at 09/24/22 1233   lactulose (CHRONULAC) 10 GM/15ML solution 20 g  20 g Oral BID PRN Sunnie Nielsen, DO   20 g at 09/21/22 1648   levothyroxine (SYNTHROID) tablet 50 mcg  50 mcg Oral Q0600 Andris Baumann, MD   50 mcg at 09/24/22 0532   minoxidil (LONITEN) tablet 2.5 mg  2.5 mg Oral Daily Kathrynn Running, MD   2.5 mg at 09/24/22 1236   multivitamin (RENA-VIT) tablet 1 tablet  1 tablet Oral QHS Sunnie Nielsen, DO   1 tablet at 09/23/22 2059   ondansetron (ZOFRAN) tablet 4 mg  4 mg Oral Q6H PRN Andris Baumann, MD       Or   ondansetron [facility] [place]) injection 4 mg  4 mg Intravenous Q6H PRN Andris Baumann, MD   4 mg at 09/24/22 1321   pantoprazole  (PROTONIX) EC tablet 40 mg  40 mg Oral Daily Lindajo Royal V, MD   40 mg at 09/24/22 1233   polyethylene glycol (MIRALAX / GLYCOLAX) packet 17 g  17 g Oral Daily Kathrynn Running, MD   17 g at 09/24/22 1236   trihexyphenidyl (ARTANE) tablet 2 mg  2 mg Oral BID Andris Baumann, MD   2 mg at 09/24/22 1236   zinc sulfate capsule 220 mg  220 mg Oral Daily Sunnie Nielsen, DO  220 mg at 09/24/22 1233     Medication List       TAKE these medications     albuterol 108 (90 Base) MCG/ACT inhaler Commonly known as: VENTOLIN HFA Inhale 1-2 puffs into the lungs every 4 (four) hours as needed for wheezing or shortness of breath.    amLODipine 10 MG tablet Commonly known as: NORVASC Take 10 mg by mouth every morning.    b complex-vitamin c-folic acid 0.8 MG Tabs tablet Take 1 tablet by mouth at bedtime.    calcium acetate 667 MG capsule Commonly known as: PHOSLO Take 1,334 mg by mouth 3 (three) times daily with meals.    carbamazepine 200 MG tablet Commonly known as: TEGRETOL Take 1,000 mg by mouth every morning.    ceFAZolin  IVPB Commonly known as: ANCEF Inject 2 g into the vein every Monday, Wednesday, and Friday for 19 days. Cefazolin 2gm IV MWF with hemodialysis Indication:  S gallolyticus bacteremia Last Day of Therapy:  5/15/20204 Labs - Once weekly:  CBC/D and CMP To be provided and administered at HD center Start taking on: September 25, 2022    cholecalciferol 1000 units tablet Commonly known as: VITAMIN D Take 1,000 Units by mouth in the morning.    cloNIDine 0.2 mg/24hr patch Commonly known as: CATAPRES - Dosed in mg/24 hr Place 1 patch (0.2 mg total) onto the skin once a week.    doxazosin 4 MG tablet Commonly known as: CARDURA Take 1 tablet (4 mg total) by mouth at bedtime.    ferrous sulfate 325 (65 FE) MG tablet Take 1 tablet (325 mg total) by mouth daily.    fluPHENAZine 2.5 MG tablet Commonly known as: PROLIXIN Take 2.5 mg by mouth in the morning.     Fluticasone-Salmeterol 250-50 MCG/DOSE Aepb Commonly known as: ADVAIR Inhale 1 puff into the lungs 2 (two) times daily.    hydrALAZINE 25 MG tablet Commonly known as: APRESOLINE Take 50 mg by mouth 3 (three) times daily.    isosorbide mononitrate 60 MG 24 hr tablet Commonly known as: IMDUR Take 60 mg by mouth 2 (two) times daily.    levothyroxine 50 MCG tablet Commonly known as: SYNTHROID Take 1 tablet (50 mcg total) by mouth daily at 6 (six) AM.    minoxidil 2.5 MG tablet Commonly known as: LONITEN Take 1 tablet (2.5 mg total) by mouth daily.    omeprazole 40 MG capsule Commonly known as: PRILOSEC Take 40 mg by mouth every morning.    polyethylene glycol 17 g packet Commonly known as: MIRALAX / GLYCOLAX Take 17 g by mouth daily as needed for mild constipation.    promethazine-dextromethorphan 6.25-15 MG/5ML syrup Commonly known as: PROMETHAZINE-DM Take 5 mLs by mouth every 6 (six) hours as needed for cough.    trihexyphenidyl 2 MG tablet Commonly known as: ARTANE Take 2 mg by mouth 2 (two) times daily.    zinc sulfate 220 (50 Zn) MG capsule Take 220 mg by mouth daily.      Relevant Imaging Results:  Relevant Lab Results:   Additional Information    Chapman Fitch, RN

## 2022-09-24 NOTE — Progress Notes (Signed)
[facility]  ROUNDING NOTE   Subjective:   Todd Brown is a 62 year old male with past medical conditions including COPD, hypertension, DVT, schizophrenia, and end-stage renal disease on hemodialysis.  Patient presents to the emergency department with lethargy.   Patient is known to our practice and receives outpatient dialysis treatments at [facility] on a MWF schedule, supervised by Dr. Cherylann Ratel.   Patient seen and evaluated during dialysis   HEMODIALYSIS FLOWSHEET:  Blood Flow Rate (mL/min): 400 mL/min Arterial Pressure (mmHg): -120 mmHg Venous Pressure (mmHg): 260 mmHg TMP (mmHg): 4 mmHg Ultrafiltration Rate (mL/min): 933 mL/min Dialysate Flow Rate (mL/min): 300 ml/min  More awake today, requesting breakfast tray  Objective:  Vital signs in last 24 hours:  Temp:  [97.5 F (36.4 C)-98.2 F (36.8 C)] 97.8 F (36.6 C) (04/25 0749) Pulse Rate:  [47-58] 58 (04/25 0930) Resp:  [7-18] 18 (04/25 0930) BP: (122-208)/(74-102) 151/79 (04/25 0930) SpO2:  [97 %-100 %] 97 % (04/25 0930) Weight:  [75.1 kg-76.1 kg] 76.1 kg (04/25 0758)  Weight change:  Filed Weights   09/23/22 0836 09/23/22 1149 09/24/22 0758  Weight: 76.4 kg 75.1 kg 76.1 kg    Intake/Output: I/O last 3 completed shifts: In: 240 [P.O.:240] Out: 1400 [Other:1400]   Intake/Output this shift:  No intake/output data recorded.  Physical Exam: General: NAD  Head: Normocephalic, atraumatic. Moist oral mucosal membranes  Eyes: Anicteric  Lungs:  Clear to auscultation, normal effort, [place] O2  Heart: Regular rate and rhythm  Abdomen:  Soft, nontender  Extremities:  No peripheral edema.  Neurologic: Alert and oriented  Skin: No lesions  Access: Lt avf    Basic Metabolic Panel: Recent Labs  Lab 09/18/22 1120 09/21/22 0448 09/23/22 0836 09/24/22 0740  NA 134* 133* 131* 132*  K 3.9 5.3* 5.2* 5.1  CL 96* 97* 95* 96*  CO2 GLUCOSE 151* 73 74 72  BUN 29* 54* 49* 42*   CREATININE 4.74* 7.63* 7.42* 6.40*  CALCIUM 8.4* 9.3 9.1 8.9  PHOS 2.5  --  4.4 3.7     Liver Function Tests: Recent Labs  Lab 09/18/22 1120 09/23/22 0836 09/24/22 0740  ALBUMIN 2.7* 2.9* 2.6*    No results for input(s): "LIPASE", "AMYLASE" in the last 168 hours. No results for input(s): "AMMONIA" in the last 168 hours.  CBC: Recent Labs  Lab 09/18/22 1120 09/21/22 0448 09/23/22 0836 09/24/22 0740  WBC 3.7* 5.0 3.7* 3.7*  HGB 10.7* 11.8* 11.9* 12.9*  HCT 32.4* 35.6* 35.2* 38.5*  MCV 100.9* 99.7 99.2 99.2  PLT 154 190 194 156     Cardiac Enzymes: No results for input(s): "CKTOTAL", "CKMB", "CKMBINDEX", "TROPONINI" in the last 168 hours.  BNP: Invalid input(s): "POCBNP"  CBG: No results for input(s): "GLUCAP" in the last 168 hours.  Microbiology: Results for orders placed or performed during the hospital encounter of 09/14/22  Resp panel by RT-PCR (RSV, Flu A&B, Covid) Anterior Nasal Swab     Status: None   Collection Time: 09/14/22  7:10 PM   Specimen: Anterior Nasal Swab  Result Value Ref Range Status   SARS Coronavirus 2 by RT PCR NEGATIVE NEGATIVE Final    Comment: (NOTE) SARS-CoV-2 target nucleic acids are NOT DETECTED.  The SARS-CoV-2 RNA is generally detectable in upper respiratory specimens during the acute phase of infection. The lowest concentration of SARS-CoV-2 viral copies this assay can detect is 138 copies/mL. A negative result does not preclude SARS-Cov-2 infection and should not be  used as the sole basis for treatment or other patient management decisions. A negative result may occur with  improper specimen collection/handling, submission of specimen other than nasopharyngeal swab, presence of viral mutation(s) within the areas targeted by this assay, and inadequate number of viral copies(<138 copies/mL). A negative result must be combined with clinical observations, patient history, and epidemiological information. The expected result  is Negative.  Fact Sheet for Patients:  BloggerCourse.com  Fact Sheet for Healthcare Providers:  SeriousBroker.it  This test is no t yet approved or cleared by the [place] FDA and  has been authorized for detection and/or diagnosis of SARS-CoV-2 by FDA under an Emergency Use Authorization (EUA). This EUA will remain  in effect (meaning this test can be used) for the duration of the COVID-19 declaration under Section 564(b)(1) of the Act, 21 U.S.C.section 360bbb-3(b)(1), unless the authorization is terminated  or revoked sooner.       Influenza A by PCR NEGATIVE NEGATIVE Final   Influenza B by PCR NEGATIVE NEGATIVE Final    Comment: (NOTE) The Xpert Xpress SARS-CoV-2/FLU/RSV plus assay is intended as an aid in the diagnosis of influenza from Nasopharyngeal swab specimens and should not be used as a sole basis for treatment. Nasal washings and aspirates are unacceptable for Xpert Xpress SARS-CoV-2/FLU/RSV testing.  Fact Sheet for Patients: BloggerCourse.com  Fact Sheet for Healthcare Providers: SeriousBroker.it  This test is not yet approved or cleared by the [place] FDA and has been authorized for detection and/or diagnosis of SARS-CoV-2 by FDA under an Emergency Use Authorization (EUA). This EUA will remain in effect (meaning this test can be used) for the duration of the COVID-19 declaration under Section 564(b)(1) of the Act, 21 U.S.C. section 360bbb-3(b)(1), unless the authorization is terminated or revoked.     Resp Syncytial Virus by PCR NEGATIVE NEGATIVE Final    Comment: (NOTE) Fact Sheet for Patients: BloggerCourse.com  Fact Sheet for Healthcare Providers: SeriousBroker.it  This test is not yet approved or cleared by the [place] FDA and has been authorized for detection and/or diagnosis of  SARS-CoV-2 by FDA under an Emergency Use Authorization (EUA). This EUA will remain in effect (meaning this test can be used) for the duration of the COVID-19 declaration under Section 564(b)(1) of the Act, 21 U.S.C. section 360bbb-3(b)(1), unless the authorization is terminated or revoked.  Performed at [facility], [address], [place], [place] [address]   Blood Culture (routine x 2)     Status: Abnormal   Collection Time: 09/14/22  7:10 PM   Specimen: BLOOD  Result Value Ref Range Status   Specimen Description   Final    BLOOD BLOOD RIGHT WRIST Performed at [facility], [address] [place], [place] [address]    Special Requests   Final    BOTTLES DRAWN AEROBIC AND ANAEROBIC Blood Culture results may not be optimal due to an inadequate volume of blood received in culture bottles Performed at [facility], [address] [place], [place] [address]    Culture  Setup Time   Final    GRAM POSITIVE COCCI IN BOTH AEROBIC AND ANAEROBIC BOTTLES CRITICAL RESULT CALLED TO, READ BACK BY AND VERIFIED WITH: Albertine Grates 09/15/22 0715 MW Performed at [facility], [address] [place], [place] [address]    Culture STREPTOCOCCUS GALLOLYTICUS (A)  Final   Report Status 09/17/2022 FINAL  Final   Organism ID, Bacteria STREPTOCOCCUS GALLOLYTICUS  Final      Susceptibility  Streptococcus gallolyticus - MIC*    PENICILLIN 0.12 SENSITIVE Sensitive     CEFTRIAXONE <=0.12 SENSITIVE Sensitive     ERYTHROMYCIN >=8 RESISTANT Resistant     LEVOFLOXACIN 2 SENSITIVE Sensitive     VANCOMYCIN 0.5 SENSITIVE Sensitive     * STREPTOCOCCUS GALLOLYTICUS  Blood Culture ID Panel (Reflexed)     Status: Abnormal   Collection Time: 09/14/22  7:10 PM  Result Value Ref Range Status   Enterococcus faecalis NOT DETECTED NOT DETECTED Final   Enterococcus Faecium NOT DETECTED NOT DETECTED Final   Listeria monocytogenes NOT DETECTED NOT DETECTED Final   Staphylococcus  species NOT DETECTED NOT DETECTED Final   Staphylococcus aureus (BCID) NOT DETECTED NOT DETECTED Final   Staphylococcus epidermidis NOT DETECTED NOT DETECTED Final   Staphylococcus lugdunensis NOT DETECTED NOT DETECTED Final   Streptococcus species DETECTED (A) NOT DETECTED Final    Comment: Not Enterococcus species, Streptococcus agalactiae, Streptococcus pyogenes, or Streptococcus pneumoniae. CRITICAL RESULT CALLED TO, READ BACK BY AND VERIFIED WITH: TRAY GREENWOOD 09/15/22 0715 MW    Streptococcus agalactiae NOT DETECTED NOT DETECTED Final   Streptococcus pneumoniae NOT DETECTED NOT DETECTED Final   Streptococcus pyogenes NOT DETECTED NOT DETECTED Final   A.calcoaceticus-baumannii NOT DETECTED NOT DETECTED Final   Bacteroides fragilis NOT DETECTED NOT DETECTED Final   Enterobacterales NOT DETECTED NOT DETECTED Final   Enterobacter cloacae complex NOT DETECTED NOT DETECTED Final   Escherichia coli NOT DETECTED NOT DETECTED Final   Klebsiella aerogenes NOT DETECTED NOT DETECTED Final   Klebsiella oxytoca NOT DETECTED NOT DETECTED Final   Klebsiella pneumoniae NOT DETECTED NOT DETECTED Final   Proteus species NOT DETECTED NOT DETECTED Final   Salmonella species NOT DETECTED NOT DETECTED Final   Serratia marcescens NOT DETECTED NOT DETECTED Final   Haemophilus influenzae NOT DETECTED NOT DETECTED Final   Neisseria meningitidis NOT DETECTED NOT DETECTED Final   Pseudomonas aeruginosa NOT DETECTED NOT DETECTED Final   Stenotrophomonas maltophilia NOT DETECTED NOT DETECTED Final   Candida albicans NOT DETECTED NOT DETECTED Final   Candida auris NOT DETECTED NOT DETECTED Final   Candida glabrata NOT DETECTED NOT DETECTED Final   Candida krusei NOT DETECTED NOT DETECTED Final   Candida parapsilosis NOT DETECTED NOT DETECTED Final   Candida tropicalis NOT DETECTED NOT DETECTED Final   Cryptococcus neoformans/gattii NOT DETECTED NOT DETECTED Final    Comment: Performed at [facility], [address] [place], [place] [address]  Blood Culture (routine x 2)     Status: Abnormal   Collection Time: 09/14/22  7:15 PM   Specimen: BLOOD  Result Value Ref Range Status   Specimen Description   Final    BLOOD RIGHT ANTECUBITAL Performed at [facility], [address] [place], [place] [address]    Special Requests   Final    BOTTLES DRAWN AEROBIC AND ANAEROBIC Blood Culture results may not be optimal due to an inadequate volume of blood received in culture bottles Performed at [facility], [address] [place], [place] [address]    Culture  Setup Time   Final    GRAM POSITIVE COCCI IN BOTH AEROBIC AND ANAEROBIC BOTTLES CRITICAL RESULT CALLED TO, READ BACK BY AND VERIFIED WITH: TRAY GREENWOOD 09/15/22 0715 MW CRITICAL VALUE NOTED.  VALUE IS CONSISTENT WITH PREVIOUSLY REPORTED AND CALLED VALUE.    Culture (A)  Final    STREPTOCOCCUS GALLOLYTICUS SUSCEPTIBILITIES PERFORMED ON PREVIOUS CULTURE WITHIN THE LAST 5 DAYS. Performed at [facility],  [address], [place], [place] [address]    Report Status 09/17/2022 FINAL  Final  Culture, blood (Routine X 2) w Reflex to ID Panel     Status: None   Collection Time: 09/17/22  8:49 AM   Specimen: BLOOD  Result Value Ref Range Status   Specimen Description BLOOD RIGHT HAND  Final   Special Requests   Final    BOTTLES DRAWN AEROBIC ONLY Blood Culture adequate volume   Culture   Final    NO GROWTH 5 DAYS Performed at [facility], [address] [place], [place] [address]    Report Status 09/22/2022 FINAL  Final  Culture, blood (Routine X 2) w Reflex to ID Panel     Status: None   Collection Time: 09/17/22  8:49 AM   Specimen: BLOOD  Result Value Ref Range Status   Specimen Description BLOOD LEFT HAND  Final   Special Requests   Final    BOTTLES DRAWN AEROBIC AND ANAEROBIC Blood Culture results may not be optimal due to an excessive volume of blood received in culture bottles    Culture   Final    NO GROWTH 5 DAYS Performed at [facility], [address] [place], [place] [address]    Report Status 09/22/2022 FINAL  Final  MRSA Next Gen by PCR, Nasal     Status: None   Collection Time: 09/18/22 10:51 PM   Specimen: Nasal Mucosa; Nasal Swab  Result Value Ref Range Status   MRSA by PCR Next Gen NOT DETECTED NOT DETECTED Final    Comment: (NOTE) The GeneXpert MRSA Assay (FDA approved for NASAL specimens only), is one component of a comprehensive MRSA colonization surveillance program. It is not intended to diagnose MRSA infection nor to guide or monitor treatment for MRSA infections. Test performance is not FDA approved in patients less than 56 years old. Performed at [facility], [address] [place], [place] [address]     Coagulation Studies: No results for input(s): "LABPROT", "INR" in the last 72 hours.   Urinalysis: No results for input(s): "COLORURINE", "LABSPEC", "PHURINE", "GLUCOSEU", "HGBUR", "BILIRUBINUR", "KETONESUR", "PROTEINUR", "UROBILINOGEN", "NITRITE", "LEUKOCYTESUR" in the last 72 hours.  Invalid input(s): "APPERANCEUR"    Imaging: NM WBC SCAN TUMOR LOC LIMITED  Result Date: 09/24/2022 CLINICAL DATA:  Concern for infected AV fistula. EXAM: NUCLEAR MEDICINE LEUKOCYTE SCAN TECHNIQUE: Following intravenous administration of radiolabeled white blood cells, images of the head, neck, trunk, and extremities were obtained on subsequent days. RADIOPHARMACEUTICALS:  10.47 Tc 99 Ceretec labeled autologous leukocytes IV COMPARISON:  CT scan 09/14/2022 FINDINGS: No areas of abnormal uptake identified in the left upper extremity to suggest infected AV fistula or nearby abscess. No significant areas of abnormal uptake in the chest or abdomen. Normal activity in the liver and spleen. IMPRESSION: Negative white blood cell scan for focal source of infection. Electronically Signed   By: Rudie Meyer M.D.   On: 09/24/2022 09:09      Medications:    cefTRIAXone (ROCEPHIN)  IV 2 g (09/23/22 1241)    amLODipine  10 mg Oral q morning   calcium acetate  1,334 mg Oral TID WC   carbamazepine  1,000 mg Oral q morning   cholecalciferol  1,000 Units Oral Daily   cloNIDine  0.2 mg Transdermal Weekly   ferrous sulfate  325 mg Oral Daily   fluPHENAZine  2.5 mg Oral Daily   fluticasone furoate-vilanterol  1 puff Inhalation Daily   heparin  5,000  Units Subcutaneous Q8H   hydrALAZINE  50 mg Oral TID   isosorbide mononitrate  60 mg Oral BID   levothyroxine  50 mcg Oral Q0600   minoxidil  2.5 mg Oral Daily   multivitamin  1 tablet Oral QHS   pantoprazole  40 mg Oral Daily   polyethylene glycol  17 g Oral Daily   trihexyphenidyl  2 mg Oral BID   zinc sulfate  220 mg Oral Daily   acetaminophen **OR** acetaminophen, albuterol, HYDROcodone-acetaminophen, lactulose, ondansetron **OR** ondansetron (ZOFRAN) IV  Assessment/ Plan:  Mr. ANTRON SETH is a 62 y.o.  male is a 62 year old male with past medical conditions including COPD, hypertension, DVT, schizophrenia, and end-stage renal disease on hemodialysis.  Patient presents to the emergency department with lethargy  [facility] [place]/MWF/left aVF   Fluid volume overload with end stage renal disease on hemodialysis. CT chest abd pelvis show fluid overload with mild pulmonary edema and pleural effusions.    Dialysis today with UF 2L as tolerated. Receiving extra treatment today due to previous two treatments terminating early for scheduled procedures. Next treatment scheduled for Friday.   2. Anemia of chronic kidney disease Lab Results  Component Value Date   HGB 12.9 (L) 09/24/2022    Hemoglobin at goal. No need for ESA's  3. Secondary Hyperparathyroidism: with outpatient labs: PTH 459, phosphorus 3.4, calcium 9.7 on 06/08/22 .   Lab Results  Component Value Date   PTH 194 (H) 05/11/2017   CALCIUM 8.9 09/24/2022   CAION 1.19 03/27/2022   PHOS 3.7 09/24/2022   Calcium and phosphorus within optimal target. Continue cholecalciferol and calcium acetate.  4.  Hypertension with chronic kidney disease.  Home regimen includes amlodipine, clonidine, hydralazine, isosorbide, doxazosin and minoxidil.  All currently held at this time.  Blood pressure 151/79 during dialysis   LOS: 10   4/25/20249:50 AM

## 2022-09-24 NOTE — Progress Notes (Signed)
PHARMACY CONSULT NOTE FOR:  OUTPATIENT  PARENTERAL ANTIBIOTIC THERAPY (OPAT)  Indication: S. Gallolyticus bacteremia Regimen: Cefazolin 2gm IV qHD MWF End date: 10/14/2022  IV antibiotic discharge orders are pended. To discharging provider:  please sign these orders via discharge navigator,  Select New Orders & click on the button choice - Manage This Unsigned Work.     Thank you for allowing pharmacy to be a part of this patient's care.  Juliette Alcide, PharmD, BCPS, BCIDP Work Cell: [phone] 09/24/2022 1:09 PM

## 2022-09-24 NOTE — Discharge Summary (Signed)
Todd Cavanagh Krinsky ZOX:[id number] DOB: 1960/06/08 DOA: 09/14/2022  PCP: Smiley Houseman, NP  Admit date: 09/14/2022 Discharge date: 09/24/2022  Time spent: 35 minutes  Recommendations for Outpatient Follow-up:  Antibiotics at dialysis through 5/15     Discharge Diagnoses:  Principal Problem:   Bacteremia Active Problems:   Severe sepsis versus SIRS   Acute on chronic respiratory failure with hypoxia   CAP (community acquired pneumonia)   Anasarca (mild pulmonary edema, pleural and pericardial effusion, ascites on CT)   Acute metabolic encephalopathy   ESRD on hemodialysis   Essential hypertension   COPD (chronic obstructive pulmonary disease)   Schizophrenia   Essential hypertension, benign   Acute on chronic respiratory failure   Discharge Condition: stable  Diet recommendation: low sodium heart healthy  Filed Weights   09/23/22 1149 09/24/22 0758 09/24/22 1141  Weight: 75.1 kg 76.1 kg 75.4 kg    History of present illness:  From admission h and p Todd SYMMONDS is a 62 y.o. 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  Patient was in his usual state of health earlier in today and went to and completed his dialysis session but on arrival home he was lethargic.  On arrival of EMS he was saturating in the 70s on room air and was placed on NRB for transport.   Hospital Course:  Patient presents with encephalopathy. Found to be febile and hypoxic, treated with hfnc and empiric antibiotics. Infectious w/u revealed strep bacteremia. ID followed. W/u involved TEE which was negative, wbc scan to evaluate for infection at fistula site or  elsewhere (negative). 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 Nephrology maintained the patient on hemodialysis. Received an extra session on 4/25 as unable to meet UF goal on 4/24. HTN at times uncontrolled but this resolved with re-institution of home meds and extra dialysis session. Respiratory status improved and patient now stable on his home 2 liters. His encephalopathy resolved. He will need to continue cefazolin mwf with dialysis end date 5/15, nephrology to coordinate this with his outpatient dialysis center.   Procedures: TEE   Consultations: ID, nephrology  Discharge Exam: Vitals:   09/24/22 1100 09/24/22 1130  BP: 127/68 132/72  Pulse: (!) 54 (!) 53  Resp: 14 15  Temp:  98 F (36.7 C)  SpO2: 99% 99%    General: NAD Cardiovascular: RRR Respiratory: CTAB Ext: warm  Discharge Instructions   Discharge Instructions     Diet - low sodium heart healthy   Complete by: As directed    Home infusion instructions   Complete by: As directed    Instructions: Flushing of vascular access device: 0.9% NaCl pre/post medication administration and prn patency; Heparin 100 u/ml, 5ml for implanted ports and Heparin 10u/ml, 5ml for all other central venous catheters.   Increase activity slowly   Complete by: As directed       Allergies as of 09/24/2022       Reactions   Chlorpromazine Other (See Comments)   Reaction:  Unknown , pt states it makes him feel real bad Reaction:  Unknown , pt states it makes him feel real bad        Medication List  TAKE these medications    albuterol 108 (90 Base) MCG/ACT inhaler Commonly known as: VENTOLIN HFA Inhale 1-2 puffs into the lungs every 4 (four) hours as needed for wheezing or shortness of breath.   amLODipine 10 MG tablet Commonly known as: NORVASC Take 10 mg by mouth every morning.   b complex-vitamin c-folic acid 0.8 MG Tabs tablet Take 1 tablet by mouth at  bedtime.   calcium acetate 667 MG capsule Commonly known as: PHOSLO Take 1,334 mg by mouth 3 (three) times daily with meals.   carbamazepine 200 MG tablet Commonly known as: TEGRETOL Take 1,000 mg by mouth every morning.   ceFAZolin  IVPB Commonly known as: ANCEF Inject 2 g into the vein every Monday, Wednesday, and Friday for 19 days. Cefazolin 2gm IV MWF with hemodialysis Indication:  S gallolyticus bacteremia Last Day of Therapy:  5/15/20204 Labs - Once weekly:  CBC/D and CMP To be provided and administered at HD center Start taking on: September 25, 2022   cholecalciferol 1000 units tablet Commonly known as: VITAMIN D Take 1,000 Units by mouth in the morning.   cloNIDine 0.2 mg/24hr patch Commonly known as: CATAPRES - Dosed in mg/24 hr Place 1 patch (0.2 mg total) onto the skin once a week.   doxazosin 4 MG tablet Commonly known as: CARDURA Take 1 tablet (4 mg total) by mouth at bedtime.   ferrous sulfate 325 (65 FE) MG tablet Take 1 tablet (325 mg total) by mouth daily.   fluPHENAZine 2.5 MG tablet Commonly known as: PROLIXIN Take 2.5 mg by mouth in the morning.   Fluticasone-Salmeterol 250-50 MCG/DOSE Aepb Commonly known as: ADVAIR Inhale 1 puff into the lungs 2 (two) times daily.   hydrALAZINE 25 MG tablet Commonly known as: APRESOLINE Take 50 mg by mouth 3 (three) times daily.   isosorbide mononitrate 60 MG 24 hr tablet Commonly known as: IMDUR Take 60 mg by mouth 2 (two) times daily.   levothyroxine 50 MCG tablet Commonly known as: SYNTHROID Take 1 tablet (50 mcg total) by mouth daily at 6 (six) AM.   minoxidil 2.5 MG tablet Commonly known as: LONITEN Take 1 tablet (2.5 mg total) by mouth daily.   omeprazole 40 MG capsule Commonly known as: PRILOSEC Take 40 mg by mouth every morning.   polyethylene glycol 17 g packet Commonly known as: MIRALAX / GLYCOLAX Take 17 g by mouth daily as needed for mild constipation.   promethazine-dextromethorphan  6.25-15 MG/5ML syrup Commonly known as: PROMETHAZINE-DM Take 5 mLs by mouth every 6 (six) hours as needed for cough.   trihexyphenidyl 2 MG tablet Commonly known as: ARTANE Take 2 mg by mouth 2 (two) times daily.   zinc sulfate 220 (50 Zn) MG capsule Take 220 mg by mouth daily.               Home Infusion Instuctions  (From admission, onward)           Start     Ordered   09/24/22 0000  Home infusion instructions       Question:  Instructions  Answer:  Flushing of vascular access device: 0.9% NaCl pre/post medication administration and prn patency; Heparin 100 u/ml, 5ml for implanted ports and Heparin 10u/ml, 5ml for all other central venous catheters.   09/24/22 1402           Allergies  Allergen Reactions   Chlorpromazine Other (See Comments)    Reaction:  Unknown , pt states it makes him feel real  bad Reaction:  Unknown , pt states it makes him feel real bad       The results of significant diagnostics from this hospitalization (including imaging, microbiology, ancillary and laboratory) are listed below for reference.    Significant Diagnostic Studies: NM WBC SCAN TUMOR LOC LIMITED  Result Date: 09/24/2022 CLINICAL DATA:  Concern for infected AV fistula. EXAM: NUCLEAR MEDICINE LEUKOCYTE SCAN TECHNIQUE: Following intravenous administration of radiolabeled white blood cells, images of the head, neck, trunk, and extremities were obtained on subsequent days. RADIOPHARMACEUTICALS:  10.47 Tc 99 Ceretec labeled autologous leukocytes IV COMPARISON:  CT scan 09/14/2022 FINDINGS: No areas of abnormal uptake identified in the left upper extremity to suggest infected AV fistula or nearby abscess. No significant areas of abnormal uptake in the chest or abdomen. Normal activity in the liver and spleen. IMPRESSION: Negative white blood cell scan for focal source of infection. Electronically Signed   By: Rudie Meyer M.D.   On: 09/24/2022 09:09   ECHO TEE  Result Date:  09/21/2022    TRANSESOPHOGEAL ECHO REPORT   Patient Name:   DOVER HEAD Date of Exam: 09/21/2022 Medical Rec #:  [id number]        Height:       71.0 in Accession #:    [id number]       Weight:       166.7 lb Date of Birth:  24-Oct-1960        BSA:          1.951 m Patient Age:    61 years         BP:           184/86 mmHg Patient Gender: M                HR:           52 bpm. Exam Location:  [facility] Procedure: Transesophageal Echo, Cardiac Doppler and Color Doppler Indications:     Bacteremia R78.81  History:         Patient has prior history of Echocardiogram examinations, most                  recent 09/17/2022. COPD; Risk Factors:Hypertension and                  Dyslipidemia.  Sonographer:     Cristela Blue Referring Phys:  [id number] Raymon Mutton DUNN Diagnosing Phys: Debbe Odea MD PROCEDURE: The transesophogeal probe was passed without difficulty through the esophogus of the patient. Sedation performed by performing physician. The patient developed no complications during the procedure.  IMPRESSIONS  1. Left ventricular ejection fraction, by estimation, is 50 to 55%. The left ventricle has low normal function.  2. Right ventricular systolic function is low normal. The right ventricular size is moderately enlarged.  3. Left atrial size was mild to moderately dilated. No left atrial/left atrial appendage thrombus was detected.  4. Right atrial size was moderately dilated.  5. A small pericardial effusion is present. The pericardial effusion is circumferential. There is no evidence of cardiac tamponade.  6. The mitral valve is normal in structure. Mild mitral valve regurgitation.  7. Tricuspid valve regurgitation is mild to moderate.  8. The aortic valve is tricuspid. Aortic valve regurgitation is mild. Conclusion(s)/Recommendation(s): No evidence of vegetation/infective endocarditis on this transesophageael echocardiogram. FINDINGS  Left Ventricle: Left ventricular ejection fraction, by estimation, is 50 to 55%. The  left ventricle has low normal function. The left ventricular internal cavity size  was normal in size. Right Ventricle: The right ventricular size is moderately enlarged. No increase in right ventricular wall thickness. Right ventricular systolic function is low normal. Left Atrium: Left atrial size was mild to moderately dilated. No left atrial/left atrial appendage thrombus was detected. Right Atrium: Right atrial size was moderately dilated. Pericardium: A small pericardial effusion is present. The pericardial effusion is circumferential. There is no evidence of cardiac tamponade. Mitral Valve: The mitral valve is normal in structure. Mild mitral valve regurgitation. Tricuspid Valve: The tricuspid valve is normal in structure. Tricuspid valve regurgitation is mild to moderate. Aortic Valve: The aortic valve is tricuspid. Aortic valve regurgitation is mild. Pulmonic Valve: The pulmonic valve was normal in structure. Pulmonic valve regurgitation is not visualized. Aorta: The aortic root is normal in size and structure. IAS/Shunts: No atrial level shunt detected by color flow Doppler. Debbe Odea MD Electronically signed by Debbe Odea MD Signature Date/Time: 09/21/2022/2:17:54 PM    Final    ECHOCARDIOGRAM COMPLETE  Result Date: 09/17/2022    ECHOCARDIOGRAM REPORT   Patient Name:   DARRALL Brown Date of Exam: 09/17/2022 Medical Rec #:  [id number]        Height:       71.0 in Accession #:    [id number]       Weight:       175.0 lb Date of Birth:  [birth year]        BSA:          1.992 m Patient Age:    61 years         BP:           163/89 mmHg Patient Gender: M                HR:           64 bpm. Exam Location:  [facility] Procedure: 2D Echo, Cardiac Doppler and Color Doppler Indications:     Bacteremia  History:         Patient has prior history of Echocardiogram examinations, most                  recent 06/20/2022. Previous Myocardial Infarction, COPD,                  Signs/Symptoms:Bacteremia and  Altered Mental Status; Risk                  Factors:Hypertension. ESRD on Dialysis.  Sonographer:     Mikki Harbor Referring Phys:  [id number] Lynn Ito Diagnosing Phys: Julien Nordmann MD  Sonographer Comments: Image acquisition challenging due to uncooperative patient. IMPRESSIONS  1. No valve vegetation noted. Leaflets well visualized.  2. Left ventricular ejection fraction, by estimation, is 50 to 55%. The left ventricle has low normal function. The left ventricle has no regional wall motion abnormalities. There is severe left ventricular hypertrophy. Left ventricular diastolic parameters are consistent with Grade I diastolic dysfunction (impaired relaxation). There is the interventricular septum is flattened in systole and diastole, consistent with right ventricular pressure and volume overload.  3. Right ventricular systolic function is moderately reduced. The right ventricular size is moderately enlarged. Moderately increased right ventricular wall thickness. There is moderately elevated pulmonary artery systolic pressure. The estimated right ventricular systolic pressure is 59.1 mmHg.  4. Left atrial size was severely dilated.  5. Right atrial size was severely dilated.  6. The mitral valve is normal in structure. No evidence of mitral valve regurgitation. No evidence of mitral  stenosis.  7. Tricuspid valve regurgitation is moderate to severe.  8. The aortic valve is normal in structure. Aortic valve regurgitation is mild. Aortic valve sclerosis is present, with no evidence of aortic valve stenosis.  9. There is mild dilatation of the aortic root, measuring 41 mm. 10. The inferior vena cava is normal in size with greater than 50% respiratory variability, suggesting right atrial pressure of 3 mmHg. FINDINGS  Left Ventricle: Left ventricular ejection fraction, by estimation, is 50 to 55%. The left ventricle has low normal function. The left ventricle has no regional wall motion abnormalities. The  left ventricular internal cavity size was normal in size. There is severe left ventricular hypertrophy. The interventricular septum is flattened in systole and diastole, consistent with right ventricular pressure and volume overload. Left ventricular diastolic parameters are consistent with Grade I diastolic dysfunction (impaired relaxation). Right Ventricle: The right ventricular size is moderately enlarged. Moderately increased right ventricular wall thickness. Right ventricular systolic function is moderately reduced. There is moderately elevated pulmonary artery systolic pressure. The tricuspid regurgitant velocity is 3.32 m/s, and with an assumed right atrial pressure of 15 mmHg, the estimated right ventricular systolic pressure is 59.1 mmHg. Left Atrium: Left atrial size was severely dilated. Right Atrium: Right atrial size was severely dilated. Pericardium: Trivial pericardial effusion is present. Mitral Valve: The mitral valve is normal in structure. No evidence of mitral valve regurgitation. No evidence of mitral valve stenosis. MV peak gradient, 3.3 mmHg. The mean mitral valve gradient is 1.0 mmHg. There is no evidence of mitral valve vegetation. Tricuspid Valve: The tricuspid valve is normal in structure. Tricuspid valve regurgitation is moderate to severe. No evidence of tricuspid stenosis. There is no evidence of tricuspid valve vegetation. Aortic Valve: The aortic valve is normal in structure. Aortic valve regurgitation is mild. Aortic regurgitation PHT measures 603 msec. Aortic valve sclerosis is present, with no evidence of aortic valve stenosis. Aortic valve mean gradient measures 4.0 mmHg. Aortic valve peak gradient measures 8.1 mmHg. Aortic valve area, by VTI measures 4.39 cm. There is no evidence of aortic valve vegetation. Pulmonic Valve: The pulmonic valve was normal in structure. Pulmonic valve regurgitation is mild to moderate. No evidence of pulmonic stenosis. There is no evidence of  pulmonic valve vegetation. Aorta: The aortic root is normal in size and structure. There is mild dilatation of the aortic root, measuring 41 mm. Venous: The inferior vena cava is normal in size with greater than 50% respiratory variability, suggesting right atrial pressure of 3 mmHg. IAS/Shunts: No atrial level shunt detected by color flow Doppler.  LEFT VENTRICLE PLAX 2D LVIDd:         4.20 cm   Diastology LVIDs:         2.90 cm   LV e' medial:  9.79 cm/s LV PW:         1.70 cm   LV e' lateral: 8.70 cm/s LV IVS:        2.40 cm LVOT diam:     2.50 cm LV SV:         117 LV SV Index:   59 LVOT Area:     4.91 cm  RIGHT VENTRICLE RV Basal diam:  5.35 cm RV Mid diam:    4.50 cm RV S prime:     13.70 cm/s TAPSE (M-mode): 2.4 cm LEFT ATRIUM              Index        RIGHT ATRIUM  Index LA diam:        4.70 cm  2.36 cm/m   RA Area:     34.70 cm LA Vol (A2C):   119.0 ml 59.73 ml/m  RA Volume:   152.00 ml 76.30 ml/m LA Vol (A4C):   85.3 ml  42.82 ml/m LA Biplane Vol: 102.0 ml 51.20 ml/m  AORTIC VALVE                    PULMONIC VALVE AV Area (Vmax):    4.67 cm     PV Vmax:       0.91 m/s AV Area (Vmean):   4.50 cm     PV Peak grad:  3.3 mmHg AV Area (VTI):     4.39 cm AV Vmax:           142.00 cm/s AV Vmean:          85.400 cm/s AV VTI:            0.267 m AV Peak Grad:      8.1 mmHg AV Mean Grad:      4.0 mmHg LVOT Vmax:         135.00 cm/s LVOT Vmean:        78.300 cm/s LVOT VTI:          0.239 m LVOT/AV VTI ratio: 0.90 AI PHT:            603 msec  AORTA Ao Root diam: 4.10 cm Ao Asc diam:  3.20 cm MITRAL VALVE              TRICUSPID VALVE MV Area (PHT): 2.02 cm   TR Peak grad:   44.1 mmHg MV Area VTI:   3.34 cm   TR Vmax:        332.00 cm/s MV Peak grad:  3.3 mmHg MV Mean grad:  1.0 mmHg   SHUNTS MV Vmax:       0.91 m/s   Systemic VTI:  0.24 m MV Vmean:      55.5 cm/s  Systemic Diam: 2.50 cm Julien Nordmann MD Electronically signed by Julien Nordmann MD Signature Date/Time: 09/17/2022/4:38:57 PM    Final     CT CHEST ABDOMEN PELVIS W CONTRAST  Result Date: 09/14/2022 CLINICAL DATA:  Sepsis. Pt with c/o lethargy after dialysis today. Pt received full tx at dialysis. Hx of COPD, wears 02 at 2L via [place]. EMS note pt to be 76% upon arrival to scene. EXAM: CT CHEST, ABDOMEN, AND PELVIS WITH CONTRAST TECHNIQUE: Multidetector CT imaging of the chest, abdomen and pelvis was performed following the standard protocol during bolus administration of intravenous contrast. RADIATION DOSE REDUCTION: This exam was performed according to the departmental dose-optimization program which includes automated exposure control, adjustment of the mA and/or kV according to patient size and/or use of iterative reconstruction technique. CONTRAST:  OMNIPAQUE IOHEXOL 300 MG/ML  SOLN COMPARISON:  CT chest 06/19/2022 FINDINGS: CT CHEST FINDINGS Cardiovascular: Enlarged heart size. Small volume pericardial effusion. The thoracic aorta is normal in caliber. Mild atherosclerotic plaque of the thoracic aorta. Four vessel coronary artery calcifications. The main pulmonary artery is borderline enlarged measuring up to 3.1 cm. No central pulmonary embolus. Mediastinum/Nodes: No enlarged mediastinal, hilar, or axillary lymph nodes. Thyroid gland, trachea, and esophagus demonstrate no significant findings. Lungs/Pleura: Interlobular septal wall thickening. Bibasilar passive atelectasis. Lingular ground-glass airspace opacity likely atelectasis. No focal consolidation. No pulmonary nodule. No pulmonary mass. Bilateral trace, left greater than right, pleural effusions.  No pneumothorax. Musculoskeletal: No chest wall abnormality.  Bilateral gynecomastia. No suspicious lytic or blastic osseous lesions. No acute displaced fracture. Old healed sternal fracture. CT ABDOMEN PELVIS FINDINGS Hepatobiliary: No focal liver abnormality. No gallstones, gallbladder wall thickening, or pericholecystic fluid. No biliary dilatation. Pancreas: No focal lesion. Normal  pancreatic contour. No surrounding inflammatory changes. No main pancreatic ductal dilatation. Spleen: Normal in size without focal abnormality. Adrenals/Urinary Tract: Bilateral adrenal gland hyperplasia with a poorly visualized 2 cm left adrenal gland nodule demonstrating a density of 7 Hounsfield units-consistent with a lipid rich adenoma, no further follow-up indicated. Bilateral renal cortical scarring. Bilateral kidneys enhance symmetrically. Fluid density lesions likely represent simple renal cysts. Simple renal cysts, in the absence of clinically indicated signs/symptoms, require no independent follow-up. No hydronephrosis. No hydroureter. The urinary bladder is unremarkable. No excretion of intravenous contrast on delayed imaging. Stomach/Bowel: Stomach is within normal limits. No evidence of bowel wall thickening or dilatation. Appendix appears normal. Vascular/Lymphatic: No abdominal aorta or iliac aneurysm. At least moderate atherosclerotic plaque of the aorta and its branches. No abdominal, pelvic, or inguinal lymphadenopathy. Reproductive: Prostate is unremarkable. Other: At least small volume simple free fluid within the abdomen pelvis. No intraperitoneal free gas. No organized fluid collection. Musculoskeletal: No abdominal wall hernia or abnormality. No suspicious lytic or blastic osseous lesions. No acute displaced fracture. IMPRESSION: 1. Findings of volume overload. 2. Mild pulmonary edema with bilateral trace, left greater than right, pleural effusions. Associated bilateral lower lobe and lingular atelectasis. Superimposed infection not excluded. 3. Cardiomegaly. 4. Small volume pericardial effusion. 5. At least small volume simple free fluid ascites. 6. Anasarca. 7. Aortic Atherosclerosis (ICD10-I70.0) including four-vessel coronary calcification. 8. No excretion of intravenous contrast on delayed imaging. Correlate with creatinine levels. Electronically Signed   By: Tish Frederickson M.D.   On:  09/14/2022 21:25   DG Chest Port 1 View  Result Date: 09/14/2022 CLINICAL DATA:  Questionable sepsis - evaluate for abnormality Lethargy after dialysis today. EXAM: PORTABLE CHEST 1 VIEW COMPARISON:  Radiograph and CT 06/19/2022 FINDINGS: The previous right-sided dialysis catheter is been removed. Cardiomegaly is unchanged. Mediastinal contours are unchanged. Small bilateral pleural effusions which have increased from prior exam. Diffuse interstitial thickening may represent pulmonary edema superimposed on emphysema. No pneumothorax. IMPRESSION: Unchanged cardiomegaly. Small bilateral pleural effusions have increased from prior exam. Diffuse interstitial thickening may represent pulmonary edema superimposed on emphysema. Overall findings suggest fluid overload/CHF. Electronically Signed   By: Narda Rutherford M.D.   On: 09/14/2022 19:43    Microbiology: Recent Results (from the past 240 hour(s))  Resp panel by RT-PCR (RSV, Flu A&B, Covid) Anterior Nasal Swab     Status: None   Collection Time: 09/14/22  7:10 PM   Specimen: Anterior Nasal Swab  Result Value Ref Range Status   SARS Coronavirus 2 by RT PCR NEGATIVE NEGATIVE Final    Comment: (NOTE) SARS-CoV-2 target nucleic acids are NOT DETECTED.  The SARS-CoV-2 RNA is generally detectable in upper respiratory specimens during the acute phase of infection. The lowest concentration of SARS-CoV-2 viral copies this assay can detect is 138 copies/mL. A negative result does not preclude SARS-Cov-2 infection and should not be used as the sole basis for treatment or other patient management decisions. A negative result may occur with  improper specimen collection/handling, submission of specimen other than nasopharyngeal swab, presence of viral mutation(s) within the areas targeted by this assay, and inadequate number of viral copies(<138 copies/mL). A negative result must be combined with clinical observations,  patient history, and  epidemiological information. The expected result is Negative.  Fact Sheet for Patients:  BloggerCourse.com  Fact Sheet for Healthcare Providers:  SeriousBroker.it  This test is no t yet approved or cleared by the [place] FDA and  has been authorized for detection and/or diagnosis of SARS-CoV-2 by FDA under an Emergency Use Authorization (EUA). This EUA will remain  in effect (meaning this test can be used) for the duration of the COVID-19 declaration under Section 564(b)(1) of the Act, 21 U.S.C.section 360bbb-3(b)(1), unless the authorization is terminated  or revoked sooner.       Influenza A by PCR NEGATIVE NEGATIVE Final   Influenza B by PCR NEGATIVE NEGATIVE Final    Comment: (NOTE) The Xpert Xpress SARS-CoV-2/FLU/RSV plus assay is intended as an aid in the diagnosis of influenza from Nasopharyngeal swab specimens and should not be used as a sole basis for treatment. Nasal washings and aspirates are unacceptable for Xpert Xpress SARS-CoV-2/FLU/RSV testing.  Fact Sheet for Patients: BloggerCourse.com  Fact Sheet for Healthcare Providers: SeriousBroker.it  This test is not yet approved or cleared by the [place] FDA and has been authorized for detection and/or diagnosis of SARS-CoV-2 by FDA under an Emergency Use Authorization (EUA). This EUA will remain in effect (meaning this test can be used) for the duration of the COVID-19 declaration under Section 564(b)(1) of the Act, 21 U.S.C. section 360bbb-3(b)(1), unless the authorization is terminated or revoked.     Resp Syncytial Virus by PCR NEGATIVE NEGATIVE Final    Comment: (NOTE) Fact Sheet for Patients: BloggerCourse.com  Fact Sheet for Healthcare Providers: SeriousBroker.it  This test is not yet approved or cleared by the [place] FDA and has been  authorized for detection and/or diagnosis of SARS-CoV-2 by FDA under an Emergency Use Authorization (EUA). This EUA will remain in effect (meaning this test can be used) for the duration of the COVID-19 declaration under Section 564(b)(1) of the Act, 21 U.S.C. section 360bbb-3(b)(1), unless the authorization is terminated or revoked.  Performed at [facility], [address] [place], [place] [address]   Blood Culture (routine x 2)     Status: Abnormal   Collection Time: 09/14/22  7:10 PM   Specimen: BLOOD  Result Value Ref Range Status   Specimen Description   Final    BLOOD BLOOD RIGHT WRIST Performed at [facility], [address] [place], [place] [address]    Special Requests   Final    BOTTLES DRAWN AEROBIC AND ANAEROBIC Blood Culture results may not be optimal due to an inadequate volume of blood received in culture bottles Performed at [facility], [address], [place], [place] [address]    Culture  Setup Time   Final    GRAM POSITIVE COCCI IN BOTH AEROBIC AND ANAEROBIC BOTTLES CRITICAL RESULT CALLED TO, READ BACK BY AND VERIFIED WITH: Albertine Grates 09/15/22 0715 MW Performed at [facility], [address], [place], [place] [address]    Culture STREPTOCOCCUS GALLOLYTICUS (A)  Final   Report Status 09/17/2022 FINAL  Final   Organism ID, Bacteria STREPTOCOCCUS GALLOLYTICUS  Final      Susceptibility   Streptococcus gallolyticus - MIC*    PENICILLIN 0.12 SENSITIVE Sensitive     CEFTRIAXONE <=0.12 SENSITIVE Sensitive     ERYTHROMYCIN >=8 RESISTANT Resistant     LEVOFLOXACIN 2 SENSITIVE Sensitive     VANCOMYCIN 0.5 SENSITIVE Sensitive     * STREPTOCOCCUS GALLOLYTICUS  Blood Culture ID  Panel (Reflexed)     Status: Abnormal   Collection Time: 09/14/22  7:10 PM  Result Value Ref Range Status   Enterococcus faecalis NOT DETECTED NOT DETECTED Final   Enterococcus Faecium NOT DETECTED NOT DETECTED Final   Listeria monocytogenes NOT  DETECTED NOT DETECTED Final   Staphylococcus species NOT DETECTED NOT DETECTED Final   Staphylococcus aureus (BCID) NOT DETECTED NOT DETECTED Final   Staphylococcus epidermidis NOT DETECTED NOT DETECTED Final   Staphylococcus lugdunensis NOT DETECTED NOT DETECTED Final   Streptococcus species DETECTED (A) NOT DETECTED Final    Comment: Not Enterococcus species, Streptococcus agalactiae, Streptococcus pyogenes, or Streptococcus pneumoniae. CRITICAL RESULT CALLED TO, READ BACK BY AND VERIFIED WITH: TRAY GREENWOOD 09/15/22 0715 MW    Streptococcus agalactiae NOT DETECTED NOT DETECTED Final   Streptococcus pneumoniae NOT DETECTED NOT DETECTED Final   Streptococcus pyogenes NOT DETECTED NOT DETECTED Final   A.calcoaceticus-baumannii NOT DETECTED NOT DETECTED Final   Bacteroides fragilis NOT DETECTED NOT DETECTED Final   Enterobacterales NOT DETECTED NOT DETECTED Final   Enterobacter cloacae complex NOT DETECTED NOT DETECTED Final   Escherichia coli NOT DETECTED NOT DETECTED Final   Klebsiella aerogenes NOT DETECTED NOT DETECTED Final   Klebsiella oxytoca NOT DETECTED NOT DETECTED Final   Klebsiella pneumoniae NOT DETECTED NOT DETECTED Final   Proteus species NOT DETECTED NOT DETECTED Final   Salmonella species NOT DETECTED NOT DETECTED Final   Serratia marcescens NOT DETECTED NOT DETECTED Final   Haemophilus influenzae NOT DETECTED NOT DETECTED Final   Neisseria meningitidis NOT DETECTED NOT DETECTED Final   Pseudomonas aeruginosa NOT DETECTED NOT DETECTED Final   Stenotrophomonas maltophilia NOT DETECTED NOT DETECTED Final   Candida albicans NOT DETECTED NOT DETECTED Final   Candida auris NOT DETECTED NOT DETECTED Final   Candida glabrata NOT DETECTED NOT DETECTED Final   Candida krusei NOT DETECTED NOT DETECTED Final   Candida parapsilosis NOT DETECTED NOT DETECTED Final   Candida tropicalis NOT DETECTED NOT DETECTED Final   Cryptococcus neoformans/gattii NOT DETECTED NOT DETECTED Final     Comment: Performed at [facility], [address] [place], [place] [address]  Blood Culture (routine x 2)     Status: Abnormal   Collection Time: 09/14/22  7:15 PM   Specimen: BLOOD  Result Value Ref Range Status   Specimen Description   Final    BLOOD RIGHT ANTECUBITAL Performed at [facility], [address] [place], [place] [address]    Special Requests   Final    BOTTLES DRAWN AEROBIC AND ANAEROBIC Blood Culture results may not be optimal due to an inadequate volume of blood received in culture bottles Performed at [facility], [address] [place], [place] [address]    Culture  Setup Time   Final    GRAM POSITIVE COCCI IN BOTH AEROBIC AND ANAEROBIC BOTTLES CRITICAL RESULT CALLED TO, READ BACK BY AND VERIFIED WITH: TRAY GREENWOOD 09/15/22 0715 MW CRITICAL VALUE NOTED.  VALUE IS CONSISTENT WITH PREVIOUSLY REPORTED AND CALLED VALUE.    Culture (A)  Final    STREPTOCOCCUS GALLOLYTICUS SUSCEPTIBILITIES PERFORMED ON PREVIOUS CULTURE WITHIN THE LAST 5 DAYS. Performed at [facility], [address], [place], [place] [address]    Report Status 09/17/2022 FINAL  Final  Culture, blood (Routine X 2) w Reflex to ID Panel     Status: None   Collection Time: 09/17/22  8:49 AM   Specimen: BLOOD  Result Value Ref Range Status   Specimen  Description BLOOD RIGHT HAND  Final   Special Requests   Final    BOTTLES DRAWN AEROBIC ONLY Blood Culture adequate volume   Culture   Final    NO GROWTH 5 DAYS Performed at [facility], [address] [place], [place] [address]    Report Status 09/22/2022 FINAL  Final  Culture, blood (Routine X 2) w Reflex to ID Panel     Status: None   Collection Time: 09/17/22  8:49 AM   Specimen: BLOOD  Result Value Ref Range Status   Specimen Description BLOOD LEFT HAND  Final   Special Requests   Final    BOTTLES DRAWN AEROBIC AND ANAEROBIC Blood Culture results may not be optimal due to an excessive  volume of blood received in culture bottles   Culture   Final    NO GROWTH 5 DAYS Performed at [facility], [address], [place], [place] [address]    Report Status 09/22/2022 FINAL  Final  MRSA Next Gen by PCR, Nasal     Status: None   Collection Time: 09/18/22 10:51 PM   Specimen: Nasal Mucosa; Nasal Swab  Result Value Ref Range Status   MRSA by PCR Next Gen NOT DETECTED NOT DETECTED Final    Comment: (NOTE) The GeneXpert MRSA Assay (FDA approved for NASAL specimens only), is one component of a comprehensive MRSA colonization surveillance program. It is not intended to diagnose MRSA infection nor to guide or monitor treatment for MRSA infections. Test performance is not FDA approved in patients less than 26 years old. Performed at [facility], [address] [place], [place] [address]      Labs: Basic Metabolic Panel: Recent Labs  Lab 09/18/22 1120 09/21/22 0448 09/23/22 0836 09/24/22 0740  NA 134* 133* 131* 132*  K 3.9 5.3* 5.2* 5.1  CL 96* 97* 95* 96*  CO2 29 23 26 26   GLUCOSE 151* 73 74 72  BUN 29* 54* 49* 42*  CREATININE 4.74* 7.63* 7.42* 6.40*  CALCIUM 8.4* 9.3 9.1 8.9  PHOS 2.5  --  4.4 3.7   Liver Function Tests: Recent Labs  Lab 09/18/22 1120 09/23/22 0836 09/24/22 0740  ALBUMIN 2.7* 2.9* 2.6*   No results for input(s): "LIPASE", "AMYLASE" in the last 168 hours. No results for input(s): "AMMONIA" in the last 168 hours. CBC: Recent Labs  Lab 09/18/22 1120 09/21/22 0448 09/23/22 0836 09/24/22 0740  WBC 3.7* 5.0 3.7* 3.7*  HGB 10.7* 11.8* 11.9* 12.9*  HCT 32.4* 35.6* 35.2* 38.5*  MCV 100.9* 99.7 99.2 99.2  PLT 154 190 194 156   Cardiac Enzymes: No results for input(s): "CKTOTAL", "CKMB", "CKMBINDEX", "TROPONINI" in the last 168 hours. BNP: BNP (last 3 results) No results for input(s): "BNP" in the last 8760 hours.  ProBNP (last 3 results) No results for input(s): "PROBNP" in the last 8760 hours.  CBG: No results  for input(s): "GLUCAP" in the last 168 hours.     Signed:  Silvano Bilis MD.  [facility] 09/24/2022, 2:07 PM

## 2022-09-24 NOTE — Progress Notes (Signed)
  Received patient in bed to unit.   Informed consent signed and in chart.    TX duration: 3hrs     Transported back to floor   Hand-off given to patient's nurse. No c/o and no distress noted  Access used: LAVF Access issues: none   Total UF removed: 2.0kg Medication(s) given: none Post HD VS: 132/72 Post HD weight: 75.4kg     Lynann Beaver  [facility]

## 2022-09-24 NOTE — Progress Notes (Signed)
Report was given to receiving RN. Questions were answered and encouraged.

## 2022-09-24 NOTE — Progress Notes (Signed)
Physical Therapy Treatment Patient Details Name: Todd Brown MRN: [id number] DOB: Jun 09, 1960 Today's Date: 09/24/2022   History of Present Illness Pt is a 62 y.o. 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 MD assessment includes: severe sepsis, community-acquired pneumonia, streptococcus bacteremia, acute on chronic respiratory failure with hypoxia, acute metabolic encephalopathy, acute on chronic diastolic dysfunction CHF, and anasarca.    PT Comments    Pt difficult to understand and impulsive at times but on initial introduction he seemed eager to get up and to the recliner.  However with increased interaction and cuing for mobility/exercises/transfer/plan/etc.  Pt was difficult to keep on task and consistently showed poor safety awareness but did ultimately ambulate in the room with a lot of directional and general cuing and reinforcement.  Pt moved relatively well but showed poor overall awareness, continue with POC.    Recommendations for follow up therapy are one component of a multi-disciplinary discharge planning process, led by the attending physician.  Recommendations may be updated based on patient status, additional functional criteria and insurance authorization.  Follow Up Recommendations       Assistance Recommended at Discharge Intermittent Supervision/Assistance  Patient can return home with the following A little help with walking and/or transfers;A lot of help with bathing/dressing/bathroom;Assistance with cooking/housework;Direct supervision/assist for medications management;Direct supervision/assist for financial management;Assist for transportation;Help with stairs or ramp for  entrance   Equipment Recommendations  None recommended by PT    Recommendations for Other Services       Precautions / Restrictions Precautions Precautions: Fall Restrictions Weight Bearing Restrictions: No     Mobility  Bed Mobility Overal bed mobility: Needs Assistance Bed Mobility: Supine to Sit, Sit to Supine     Supine to sit: Supervision, HOB elevated Sit to supine: Min assist   General bed mobility comments: No physical assist for sup>sit however pt requires multimodal cueing (including light assist to initiate upward LE movement) to get back up into bed.    Transfers Overall transfer level: Needs assistance Equipment used: Rolling walker (2 wheels) Transfers: Sit to/from Stand Sit to Stand: Min guard           General transfer comment: Again needed repeated and multi modal cues to safely initiate upward movement into walker.  He did stand w/o direct physical assist but due to visual impairment/impulsivity needed close guarding and heavy cuing.    Ambulation/Gait Ambulation/Gait assistance: Min assist Gait Distance (Feet): 20 Feet Assistive device: Rolling walker (2 wheels)         General Gait Details: Pt was able to do some in room ambulation (~20 ft with walker) but was impulsive with turns and did not listen well to directional cues/instructions.  He did not have any LOBs/able to maintain standing but ultimately showed poor safety awareness.   Stairs             Wheelchair Mobility    Modified Rankin (Stroke Patients Only)       Balance Overall balance assessment: Needs assistance Sitting-balance support: Feet supported, Bilateral upper extremity supported Sitting balance-[name]: Good     Standing balance support: Bilateral upper extremity supported, During functional activity, Reliant on assistive device for balance Standing balance-[name]: Fair Standing balance comment: using RW for support  Cognition Arousal/Alertness: Awake/alert Behavior During Therapy: Impulsive, Restless Overall Cognitive Status: History of cognitive impairments - at baseline                                          Exercises      General Comments        Pertinent Vitals/Pain Pain Assessment Pain Assessment: Faces Faces Pain Scale: No hurt    Home Living                          Prior Function            PT Goals (current goals can now be found in the care plan section) Progress towards PT goals: Progressing toward goals    Frequency    Min 3X/week      PT Plan Current plan remains appropriate    Co-evaluation              AM-PAC PT "6 Clicks" Mobility   Outcome Measure  Help needed turning from your back to your side while in a flat bed without using bedrails?: None Help needed moving from lying on your back to sitting on the side of a flat bed without using bedrails?: None Help needed moving to and from a bed to a chair (including a wheelchair)?: A Little Help needed standing up from a chair using your arms (e.g., wheelchair or bedside chair)?: A Little Help needed to walk in hospital room?: A Lot Help needed climbing 3-5 steps with a railing? : A Lot 6 Click Score: 18    End of Session Equipment Utilized During Treatment: Gait belt Activity Tolerance: Patient tolerated treatment well Patient left: in bed;with call bell/phone within reach;with bed alarm set   PT Visit Diagnosis: Difficulty in walking, not elsewhere classified (R26.2);Muscle weakness (generalized) (M62.81)     Time: 1610-9604 PT Time Calculation (min) (ACUTE ONLY): 25 min  Charges:  $Gait Training: 8-22 mins $Therapeutic Activity: 8-22 mins                     Malachi Pro, DPT 09/24/2022, 3:03 PM

## 2022-09-24 NOTE — TOC Transition Note (Addendum)
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  Message left for guardian Candice at DSS Transport by: [facility]  Received return call from gaurdian he is in agreement of dc Cheryl with [facility] notified of discharge  Per MD patient ready for DC to . RN, patient's family, and facility notified of DC. Discharge Summary and fl2 faxed to facility. RN given number for report. DC packet on chart. Ambulance transport requested for patient.  TOC signing off.  Bevelyn Ngo [facility] [phone]         Patient Goals and CMS Choice      Discharge Placement                         Discharge Plan and Services Additional resources added to the After Visit Summary for                                       Social Determinants of Health (SDOH) Interventions SDOH Screenings   Food Insecurity: No Food Insecurity (09/16/2022)  Housing: Low Risk  (09/16/2022)  Transportation Needs: No Transportation Needs (09/16/2022)  Utilities: Not At Risk (09/16/2022)  Alcohol Screen: Low Risk  (10/05/2019)  Financial Resource Strain: Low Risk  (10/08/2018)  Physical Activity: Unknown (10/08/2018)  Social Connections: Unknown (10/08/2018)  Stress: No Stress Concern Present (10/08/2018)  Tobacco Use: Low Risk  (09/22/2022)     Readmission Risk Interventions    09/17/2022   12:00 PM  Readmission Risk Prevention Plan  Transportation Screening Complete  Medication Review (RN Care Manager) Complete  HRI or Home Care Consult Complete  Skilled Nursing Facility Not Applicable

## 2022-10-13 ENCOUNTER — Inpatient Hospital Stay: Payer: Medicaid Other | Admitting: Infectious Diseases

## 2022-11-03 ENCOUNTER — Inpatient Hospital Stay
Admission: EM | Admit: 2022-11-03 | Discharge: 2022-11-07 | DRG: 291 | Disposition: A | Discharge status: Home Health | Payer: Medicaid Other | Source: Skilled Nursing Facility | Attending: Internal Medicine | Admitting: Internal Medicine

## 2022-11-03 ENCOUNTER — Emergency Department: Payer: Medicaid Other

## 2022-11-03 ENCOUNTER — Inpatient Hospital Stay: Payer: Medicaid Other

## 2022-11-03 ENCOUNTER — Encounter: Payer: Self-pay | Admitting: Emergency Medicine

## 2022-11-03 ENCOUNTER — Emergency Department
Admission: EM | Admit: 2022-11-03 | Discharge: 2022-11-03 | Discharge status: Absent without leave | Payer: Medicaid Other

## 2022-11-03 ENCOUNTER — Other Ambulatory Visit: Payer: Self-pay

## 2022-11-03 DIAGNOSIS — I252 Old myocardial infarction: Secondary | ICD-10-CM

## 2022-11-03 DIAGNOSIS — Z79890 Hormone replacement therapy (postmenopausal): Secondary | ICD-10-CM

## 2022-11-03 DIAGNOSIS — J690 Pneumonitis due to inhalation of food and vomit: Secondary | ICD-10-CM | POA: Diagnosis present

## 2022-11-03 DIAGNOSIS — I3139 Other pericardial effusion (noninflammatory): Secondary | ICD-10-CM | POA: Diagnosis present

## 2022-11-03 DIAGNOSIS — I2510 Atherosclerotic heart disease of native coronary artery without angina pectoris: Secondary | ICD-10-CM | POA: Diagnosis present

## 2022-11-03 DIAGNOSIS — F209 Schizophrenia, unspecified: Secondary | ICD-10-CM | POA: Diagnosis present

## 2022-11-03 DIAGNOSIS — I701 Atherosclerosis of renal artery: Secondary | ICD-10-CM | POA: Diagnosis present

## 2022-11-03 DIAGNOSIS — I5033 Acute on chronic diastolic (congestive) heart failure: Secondary | ICD-10-CM | POA: Diagnosis present

## 2022-11-03 DIAGNOSIS — Z992 Dependence on renal dialysis: Secondary | ICD-10-CM | POA: Diagnosis not present

## 2022-11-03 DIAGNOSIS — E039 Hypothyroidism, unspecified: Secondary | ICD-10-CM | POA: Diagnosis present

## 2022-11-03 DIAGNOSIS — N186 End stage renal disease: Secondary | ICD-10-CM | POA: Diagnosis present

## 2022-11-03 DIAGNOSIS — J449 Chronic obstructive pulmonary disease, unspecified: Secondary | ICD-10-CM | POA: Diagnosis present

## 2022-11-03 DIAGNOSIS — F203 Undifferentiated schizophrenia: Secondary | ICD-10-CM

## 2022-11-03 DIAGNOSIS — G9341 Metabolic encephalopathy: Secondary | ICD-10-CM | POA: Diagnosis present

## 2022-11-03 DIAGNOSIS — Z841 Family history of disorders of kidney and ureter: Secondary | ICD-10-CM

## 2022-11-03 DIAGNOSIS — N2581 Secondary hyperparathyroidism of renal origin: Secondary | ICD-10-CM | POA: Diagnosis present

## 2022-11-03 DIAGNOSIS — I5A Non-ischemic myocardial injury (non-traumatic): Secondary | ICD-10-CM | POA: Diagnosis not present

## 2022-11-03 DIAGNOSIS — I700 Atherosclerosis of aorta: Secondary | ICD-10-CM | POA: Diagnosis present

## 2022-11-03 DIAGNOSIS — K219 Gastro-esophageal reflux disease without esophagitis: Secondary | ICD-10-CM | POA: Diagnosis present

## 2022-11-03 DIAGNOSIS — Z7951 Long term (current) use of inhaled steroids: Secondary | ICD-10-CM

## 2022-11-03 DIAGNOSIS — R7881 Bacteremia: Secondary | ICD-10-CM | POA: Diagnosis not present

## 2022-11-03 DIAGNOSIS — I959 Hypotension, unspecified: Secondary | ICD-10-CM | POA: Diagnosis not present

## 2022-11-03 DIAGNOSIS — Z1152 Encounter for screening for COVID-19: Secondary | ICD-10-CM

## 2022-11-03 DIAGNOSIS — Z79899 Other long term (current) drug therapy: Secondary | ICD-10-CM

## 2022-11-03 DIAGNOSIS — Z8616 Personal history of COVID-19: Secondary | ICD-10-CM | POA: Diagnosis not present

## 2022-11-03 DIAGNOSIS — D631 Anemia in chronic kidney disease: Secondary | ICD-10-CM | POA: Diagnosis present

## 2022-11-03 DIAGNOSIS — J810 Acute pulmonary edema: Secondary | ICD-10-CM | POA: Diagnosis present

## 2022-11-03 DIAGNOSIS — I132 Hypertensive heart and chronic kidney disease with heart failure and with stage 5 chronic kidney disease, or end stage renal disease: Secondary | ICD-10-CM | POA: Diagnosis present

## 2022-11-03 DIAGNOSIS — D696 Thrombocytopenia, unspecified: Secondary | ICD-10-CM | POA: Diagnosis present

## 2022-11-03 DIAGNOSIS — E785 Hyperlipidemia, unspecified: Secondary | ICD-10-CM | POA: Diagnosis present

## 2022-11-03 DIAGNOSIS — Z86718 Personal history of other venous thrombosis and embolism: Secondary | ICD-10-CM

## 2022-11-03 DIAGNOSIS — Z793 Long term (current) use of hormonal contraceptives: Secondary | ICD-10-CM

## 2022-11-03 DIAGNOSIS — J42 Unspecified chronic bronchitis: Secondary | ICD-10-CM

## 2022-11-03 DIAGNOSIS — J9811 Atelectasis: Secondary | ICD-10-CM | POA: Diagnosis present

## 2022-11-03 DIAGNOSIS — R4182 Altered mental status, unspecified: Principal | ICD-10-CM

## 2022-11-03 DIAGNOSIS — J9621 Acute and chronic respiratory failure with hypoxia: Secondary | ICD-10-CM | POA: Diagnosis present

## 2022-11-03 DIAGNOSIS — I2489 Other forms of acute ischemic heart disease: Secondary | ICD-10-CM | POA: Diagnosis present

## 2022-11-03 DIAGNOSIS — R188 Other ascites: Secondary | ICD-10-CM | POA: Diagnosis present

## 2022-11-03 DIAGNOSIS — I10 Essential (primary) hypertension: Secondary | ICD-10-CM | POA: Diagnosis not present

## 2022-11-03 DIAGNOSIS — R0603 Acute respiratory distress: Secondary | ICD-10-CM | POA: Diagnosis present

## 2022-11-03 DIAGNOSIS — F2089 Other schizophrenia: Secondary | ICD-10-CM | POA: Diagnosis not present

## 2022-11-03 DIAGNOSIS — M1990 Unspecified osteoarthritis, unspecified site: Secondary | ICD-10-CM | POA: Diagnosis present

## 2022-11-03 DIAGNOSIS — Z888 Allergy status to other drugs, medicaments and biological substances status: Secondary | ICD-10-CM

## 2022-11-03 DIAGNOSIS — Z9981 Dependence on supplemental oxygen: Secondary | ICD-10-CM

## 2022-11-03 DIAGNOSIS — R140 Abdominal distension (gaseous): Secondary | ICD-10-CM | POA: Diagnosis present

## 2022-11-03 LAB — LIPID PANEL
Cholesterol: 213 mg/dL — ABNORMAL HIGH (ref 0–200)
HDL: 97 mg/dL (ref >40)
LDL Cholesterol: 98 mg/dL (ref 0–99)
Total CHOL/HDL Ratio: 2.2 RATIO
Triglycerides: 91 mg/dL (ref <150)
VLDL: 18 mg/dL (ref 0–40)

## 2022-11-03 LAB — BASIC METABOLIC PANEL
Anion gap: 16 — ABNORMAL HIGH (ref 5–15)
BUN: 27 mg/dL — ABNORMAL HIGH (ref 8–23)
CO2: 28 mmol/L (ref 22–32)
Calcium: 9.4 mg/dL (ref 8.9–10.3)
Chloride: 95 mmol/L — ABNORMAL LOW (ref 98–111)
Creatinine, Ser: 5.66 mg/dL — ABNORMAL HIGH (ref 0.61–1.24)
GFR, Estimated: 11 mL/min — ABNORMAL LOW (ref >60)
Glucose, Bld: 88 mg/dL (ref 70–99)
Potassium: 4.3 mmol/L (ref 3.5–5.1)
Sodium: 139 mmol/L (ref 135–145)

## 2022-11-03 LAB — EXPECTORATED SPUTUM ASSESSMENT W GRAM STAIN, RFLX TO RESP C

## 2022-11-03 LAB — CBC WITH DIFFERENTIAL/PLATELET
Abs Immature Granulocytes: 0.01 10*3/uL (ref 0.00–0.07)
Basophils Absolute: 0 10*3/uL (ref 0.0–0.1)
Basophils Relative: 0 %
Eosinophils Absolute: 0.1 10*3/uL (ref 0.0–0.5)
Eosinophils Relative: 2 %
HCT: 39.3 % (ref 39.0–52.0)
Hemoglobin: 12.5 g/dL — ABNORMAL LOW (ref 13.0–17.0)
Immature Granulocytes: 0 %
Lymphocytes Relative: 12 %
Lymphs Abs: 0.6 10*3/uL — ABNORMAL LOW (ref 0.7–4.0)
MCH: 33.7 pg (ref 26.0–34.0)
MCHC: 31.8 g/dL (ref 30.0–36.0)
MCV: 105.9 fL — ABNORMAL HIGH (ref 80.0–100.0)
Monocytes Absolute: 0.5 10*3/uL (ref 0.1–1.0)
Monocytes Relative: 10 %
Neutro Abs: 3.7 10*3/uL (ref 1.7–7.7)
Neutrophils Relative %: 76 %
Platelets: 136 10*3/uL — ABNORMAL LOW (ref 150–400)
RBC: 3.71 MIL/uL — ABNORMAL LOW (ref 4.22–5.81)
RDW: 15.3 % (ref 11.5–15.5)
WBC: 4.9 10*3/uL (ref 4.0–10.5)
nRBC: 0 % (ref 0.0–0.2)

## 2022-11-03 LAB — BLOOD GAS, VENOUS
Acid-Base Excess: 10.8 mmol/L — ABNORMAL HIGH (ref 0.0–2.0)
Bicarbonate: 37.6 mmol/L — ABNORMAL HIGH (ref 20.0–28.0)
O2 Saturation: 53.3 %
Patient temperature: 37
pCO2, Ven: 58 mmHg (ref 44–60)
pH, Ven: 7.42 (ref 7.25–7.43)
pO2, Ven: <31 mmHg — CL (ref 32–45)

## 2022-11-03 LAB — HEPATIC FUNCTION PANEL
ALT: 6 U/L (ref 0–44)
AST: 31 U/L (ref 15–41)
Albumin: 3.6 g/dL (ref 3.5–5.0)
Alkaline Phosphatase: 63 U/L (ref 38–126)
Bilirubin, Direct: 0.1 mg/dL (ref 0.0–0.2)
Indirect Bilirubin: 0.7 mg/dL (ref 0.3–0.9)
Total Bilirubin: 0.8 mg/dL (ref 0.3–1.2)
Total Protein: 7.3 g/dL (ref 6.5–8.1)

## 2022-11-03 LAB — TROPONIN I (HIGH SENSITIVITY)
Troponin I (High Sensitivity): 28 ng/L — ABNORMAL HIGH (ref <18)
Troponin I (High Sensitivity): 36 ng/L — ABNORMAL HIGH (ref <18)

## 2022-11-03 LAB — PROCALCITONIN: Procalcitonin: <0.1 ng/mL

## 2022-11-03 LAB — SARS CORONAVIRUS 2 BY RT PCR: SARS Coronavirus 2 by RT PCR: NEGATIVE

## 2022-11-03 LAB — APTT: aPTT: 34 seconds (ref 24–36)

## 2022-11-03 LAB — PROTIME-INR
INR: 1.1 (ref 0.8–1.2)
Prothrombin Time: 13.9 seconds (ref 11.4–15.2)

## 2022-11-03 LAB — MRSA NEXT GEN BY PCR, NASAL: MRSA by PCR Next Gen: NOT DETECTED

## 2022-11-03 LAB — HEPATITIS B SURFACE ANTIGEN: Hepatitis B Surface Ag: NONREACTIVE

## 2022-11-03 LAB — BRAIN NATRIURETIC PEPTIDE: B Natriuretic Peptide: 388.1 pg/mL — ABNORMAL HIGH (ref 0.0–100.0)

## 2022-11-03 LAB — LACTIC ACID, PLASMA
Lactic Acid, Venous: 1.6 mmol/L (ref 0.5–1.9)
Lactic Acid, Venous: 0.9 mmol/L (ref 0.5–1.9)

## 2022-11-03 MED ORDER — SODIUM CHLORIDE 0.9 % IV SOLN
1.5000 g | Freq: Two times a day (BID) | INTRAVENOUS | Status: DC
  Administered 2022-11-03 – 2022-11-04 (×2): 1.5 g via INTRAVENOUS
  Filled 2022-11-03 (×3): qty 4

## 2022-11-03 MED ORDER — HEPARIN SODIUM (PORCINE) 5000 UNIT/ML IJ SOLN
5000.0000 [IU] | Freq: Three times a day (TID) | INTRAMUSCULAR | Status: DC
  Administered 2022-11-03 – 2022-11-07 (×10): 5000 [IU] via SUBCUTANEOUS
  Filled 2022-11-03 (×11): qty 1

## 2022-11-03 MED ORDER — CHLORHEXIDINE GLUCONATE CLOTH 2 % EX PADS
6.0000 | MEDICATED_PAD | Freq: Every day | CUTANEOUS | Status: DC
  Administered 2022-11-03 – 2022-11-06 (×3): 6 via TOPICAL

## 2022-11-03 MED ORDER — ALBUTEROL SULFATE (2.5 MG/3ML) 0.083% IN NEBU: 2.5000 mg | INHALATION_SOLUTION | RESPIRATORY_TRACT | Status: DC | PRN

## 2022-11-03 MED ORDER — ACETAMINOPHEN 325 MG PO TABS: 650.0000 mg | ORAL_TABLET | Freq: Four times a day (QID) | ORAL | Status: DC | PRN

## 2022-11-03 MED ORDER — ASPIRIN 81 MG PO TBEC
81.0000 mg | DELAYED_RELEASE_TABLET | Freq: Every day | ORAL | Status: DC
  Administered 2022-11-05 – 2022-11-07 (×3): 81 mg via ORAL
  Filled 2022-11-03 (×3): qty 1

## 2022-11-03 MED ORDER — HYDRALAZINE HCL 20 MG/ML IJ SOLN: 5.0000 mg | INTRAMUSCULAR | Status: DC | PRN

## 2022-11-03 MED ORDER — DM-GUAIFENESIN ER 30-600 MG PO TB12: 1.0000 | ORAL_TABLET | Freq: Two times a day (BID) | ORAL | Status: DC | PRN

## 2022-11-03 MED ORDER — ONDANSETRON HCL 4 MG/2ML IJ SOLN: 4.0000 mg | Freq: Three times a day (TID) | INTRAMUSCULAR | Status: DC | PRN

## 2022-11-03 MED ORDER — IPRATROPIUM-ALBUTEROL 0.5-2.5 (3) MG/3ML IN SOLN
3.0000 mL | RESPIRATORY_TRACT | Status: DC
  Administered 2022-11-03: 3 mL via RESPIRATORY_TRACT
  Filled 2022-11-03 (×4): qty 3

## 2022-11-03 MED ORDER — SODIUM CHLORIDE 0.9 % IV SOLN
3.0000 g | Freq: Once | INTRAVENOUS | Status: AC
  Administered 2022-11-03: 3 g via INTRAVENOUS
  Filled 2022-11-03: qty 8

## 2022-11-03 MED ORDER — ACETAMINOPHEN 650 MG RE SUPP: 650.0000 mg | Freq: Four times a day (QID) | RECTAL | Status: DC | PRN

## 2022-11-03 MED ORDER — DIPHENHYDRAMINE HCL 50 MG/ML IJ SOLN: 25.0000 mg | Freq: Once | INTRAMUSCULAR | Status: DC

## 2022-11-03 NOTE — ED Notes (Signed)
Pt was able to eat apple sauce, state that he was hungry and thirsty and drink water with assistance.

## 2022-11-03 NOTE — ED Notes (Signed)
Lab notified to draw blood due to hard stick.

## 2022-11-03 NOTE — Progress Notes (Signed)
[facility]  ROUNDING NOTE   Subjective:   [name] was admitted to [facility] on 11/03/2022 for Acute on chronic respiratory failure with hypoxia (HCC) [J96.21]  Last hemodialysis treatment was yesterday. Left 1kg above, 80.5kg   Patient presents with shortness of breath and altered mental status.    Objective:  Vital signs in last 24 hours:  Temp:  [97.8 F (36.6 C)] 97.8 F (36.6 C) (06/04 1454) Pulse Rate:  [73-84] 73 (06/04 1504) Resp:  [15-24] 15 (06/04 1504) BP: (104-148)/(72-87) 136/72 (06/04 1504) SpO2:  [94 %-99 %] 94 % (06/04 1504) Weight:  [75.4 kg] 75.4 kg (06/04 0942)  Weight change:  Filed Weights   11/03/22 0942  Weight: 75.4 kg    Intake/Output: No intake/output data recorded.   Intake/Output this shift:  Total I/O In: 100 [IV Piggyback:100] Out: -   Physical Exam: General: NAD,   Head: Normocephalic, atraumatic. Moist oral mucosal membranes  Eyes: Anicteric, PERRL  Neck: Supple, trachea midline  Lungs:  +crackles  Heart: Regular rate and rhythm  Abdomen:  Soft, nontender,   Extremities:  ++ peripheral edema.  Neurologic: Nonfocal, moving all four extremities  Skin: No lesions  Access: Left AVF    Basic Metabolic Panel: Recent Labs  Lab 11/03/22 0936  NA 139  K 4.3  CL 95*  CO2 28  GLUCOSE 88  BUN 27*  CREATININE 5.66*  CALCIUM 9.4    Liver Function Tests: Recent Labs  Lab 11/03/22 0936  AST 31  ALT 6  ALKPHOS 63  BILITOT 0.8  PROT 7.3  ALBUMIN 3.6   No results for input(s): "LIPASE", "AMYLASE" in the last 168 hours. No results for input(s): "AMMONIA" in the last 168 hours.  CBC: Recent Labs  Lab 11/03/22 0928  WBC 4.9  NEUTROABS 3.7  HGB 12.5*  HCT 39.3  MCV 105.9*  PLT 136*    Cardiac Enzymes: No results for input(s): "CKTOTAL", "CKMB", "CKMBINDEX", "TROPONINI" in the last 168 hours.  BNP: Invalid input(s): "POCBNP"  CBG: No results for input(s): "GLUCAP" in the last 168  hours.  Microbiology: Results for orders placed or performed during the hospital encounter of 11/03/22  SARS Coronavirus 2 by RT PCR (hospital order, performed in [facility] hospital lab) *cepheid single result test* Anterior Nasal Swab     Status: None   Collection Time: 11/03/22  9:36 AM   Specimen: Anterior Nasal Swab  Result Value Ref Range Status   SARS Coronavirus 2 by RT PCR NEGATIVE NEGATIVE Final    Comment: (NOTE) SARS-CoV-2 target nucleic acids are NOT DETECTED.  The SARS-CoV-2 RNA is generally detectable in upper and lower respiratory specimens during the acute phase of infection. The lowest concentration of SARS-CoV-2 viral copies this assay can detect is 250 copies / mL. A negative result does not preclude SARS-CoV-2 infection and should not be used as the sole basis for treatment or other patient management decisions.  A negative result may occur with improper specimen collection / handling, submission of specimen other than nasopharyngeal swab, presence of viral mutation(s) within the areas targeted by this assay, and inadequate number of viral copies (<250 copies / mL). A negative result must be combined with clinical observations, patient history, and epidemiological information.  Fact Sheet for Patients:   RoadLapTop.co.za  Fact Sheet for Healthcare Providers: http://kim-miller.com/  This test is not yet approved or  cleared by the [place] FDA and has been authorized for detection and/or diagnosis of SARS-CoV-2 by FDA  under an Emergency Use Authorization (EUA).  This EUA will remain in effect (meaning this test can be used) for the duration of the COVID-19 declaration under Section 564(b)(1) of the Act, 21 U.S.C. section 360bbb-3(b)(1), unless the authorization is terminated or revoked sooner.  Performed at [facility], [address] [place], [place] [address]     Coagulation Studies: Recent  Labs    11/03/22 1300  LABPROT 13.9  INR 1.1    Urinalysis: No results for input(s): "COLORURINE", "LABSPEC", "PHURINE", "GLUCOSEU", "HGBUR", "BILIRUBINUR", "KETONESUR", "PROTEINUR", "UROBILINOGEN", "NITRITE", "LEUKOCYTESUR" in the last 72 hours.  Invalid input(s): "APPERANCEUR"    Imaging: CT ABDOMEN PELVIS WO CONTRAST  Result Date: 11/03/2022 CLINICAL DATA:  Bowel obstruction suspected, agitation EXAM: CT CHEST, ABDOMEN AND PELVIS WITHOUT CONTRAST TECHNIQUE: Multidetector CT imaging of the chest, abdomen and pelvis was performed following the standard protocol without IV contrast. RADIATION DOSE REDUCTION: This exam was performed according to the departmental dose-optimization program which includes automated exposure control, adjustment of the mA and/or kV according to patient size and/or use of iterative reconstruction technique. COMPARISON:  09/14/2022 FINDINGS: CT CHEST FINDINGS Cardiovascular: Mild cardiomegaly. Moderately large pericardial effusion, increased since previous. Scattered coronary and aortic calcifications. Mediastinum/Nodes: No mass or adenopathy. Lungs/Pleura: Small pleural effusions left greater than right as before. No pneumothorax. Patchy atelectasis/consolidation in the lower lobes with air bronchograms, left worse than right, slightly increased from previous. Portal edema and some and atelectasis in the upper lobes, new since previous. Musculoskeletal: Healing manubrial fracture.  No acute findings. CT ABDOMEN PELVIS FINDINGS Hepatobiliary: Layering hyperdensities in the dependent aspect of the nondilated gallbladder. No focal liver lesion or biliary ductal dilatation. Pancreas: No mass or ductal dilatation. Increase in surrounding retroperitoneal edema/fluid. Spleen: Normal in size without focal abnormality. Adrenals/Urinary Tract: Bilateral adrenal hypertrophy. Bilateral renal parenchymal atrophy with extensive cystic change. No urolithiasis or hydronephrosis. Urinary  bladder nondistended. Stomach/Bowel: Stomach is incompletely distended, unremarkable. There is circumferential wall thickening in multiple loops of small bowel in the left mid abdomen. No dilated loops are evident however. Normal appendix. The colon is incompletely distended by gas and fecal material, without acute finding. Vascular/Lymphatic: Extensive aortoiliac calcified atheromatous plaque without aneurysm. No abdominal or pelvic adenopathy. Reproductive: Prostate is unremarkable. Other: Bilateral pelvic phleboliths. Moderate abdominal ascites, increased since previous. No free air. Musculoskeletal: No acute or significant osseous findings. IMPRESSION: 1. Moderately large pericardial effusion, increased since previous. 2. Small bilateral pleural effusions, increased since previous. 3. Worsening atelectasis/consolidation in the lower lobes, left worse than right. 4. Moderate abdominal ascites, increased since previous. 5. Circumferential wall thickening in multiple loops of small bowel in the left mid abdomen, possibly enteritis. No evidence of obstruction. 6.  Aortic Atherosclerosis (ICD10-I70.0). Electronically Signed   By: Corlis Leak M.D.   On: 11/03/2022 14:15   CT CHEST WO CONTRAST  Result Date: 11/03/2022 CLINICAL DATA:  Bowel obstruction suspected, agitation EXAM: CT CHEST, ABDOMEN AND PELVIS WITHOUT CONTRAST TECHNIQUE: Multidetector CT imaging of the chest, abdomen and pelvis was performed following the standard protocol without IV contrast. RADIATION DOSE REDUCTION: This exam was performed according to the departmental dose-optimization program which includes automated exposure control, adjustment of the mA and/or kV according to patient size and/or use of iterative reconstruction technique. COMPARISON:  09/14/2022 FINDINGS: CT CHEST FINDINGS Cardiovascular: Mild cardiomegaly. Moderately large pericardial effusion, increased since previous. Scattered coronary and aortic calcifications.  Mediastinum/Nodes: No mass or adenopathy. Lungs/Pleura: Small pleural effusions left greater than right as before. No pneumothorax. Patchy atelectasis/consolidation  in the lower lobes with air bronchograms, left worse than right, slightly increased from previous. Portal edema and some and atelectasis in the upper lobes, new since previous. Musculoskeletal: Healing manubrial fracture.  No acute findings. CT ABDOMEN PELVIS FINDINGS Hepatobiliary: Layering hyperdensities in the dependent aspect of the nondilated gallbladder. No focal liver lesion or biliary ductal dilatation. Pancreas: No mass or ductal dilatation. Increase in surrounding retroperitoneal edema/fluid. Spleen: Normal in size without focal abnormality. Adrenals/Urinary Tract: Bilateral adrenal hypertrophy. Bilateral renal parenchymal atrophy with extensive cystic change. No urolithiasis or hydronephrosis. Urinary bladder nondistended. Stomach/Bowel: Stomach is incompletely distended, unremarkable. There is circumferential wall thickening in multiple loops of small bowel in the left mid abdomen. No dilated loops are evident however. Normal appendix. The colon is incompletely distended by gas and fecal material, without acute finding. Vascular/Lymphatic: Extensive aortoiliac calcified atheromatous plaque without aneurysm. No abdominal or pelvic adenopathy. Reproductive: Prostate is unremarkable. Other: Bilateral pelvic phleboliths. Moderate abdominal ascites, increased since previous. No free air. Musculoskeletal: No acute or significant osseous findings. IMPRESSION: 1. Moderately large pericardial effusion, increased since previous. 2. Small bilateral pleural effusions, increased since previous. 3. Worsening atelectasis/consolidation in the lower lobes, left worse than right. 4. Moderate abdominal ascites, increased since previous. 5. Circumferential wall thickening in multiple loops of small bowel in the left mid abdomen, possibly enteritis. No evidence  of obstruction. 6.  Aortic Atherosclerosis (ICD10-I70.0). Electronically Signed   By: Corlis Leak M.D.   On: 11/03/2022 14:15   CT HEAD WO CONTRAST ( )  Result Date: 11/03/2022 CLINICAL DATA:  Mental status change, unknown cause. EXAM: CT HEAD WITHOUT CONTRAST TECHNIQUE: Contiguous axial images were obtained from the base of the skull through the vertex without intravenous contrast. RADIATION DOSE REDUCTION: This exam was performed according to the departmental dose-optimization program which includes automated exposure control, adjustment of the mA and/or kV according to patient size and/or use of iterative reconstruction technique. COMPARISON:  Head CT 06/19/2022. FINDINGS: Brain: No acute hemorrhage. Unchanged mild chronic small-vessel disease. Cortical gray-white differentiation is otherwise preserved. Prominence of the ventricles and sulci within expected range for age. No hydrocephalus or extra-axial collection. No mass effect or midline shift. Vascular: No hyperdense vessel or unexpected calcification. Skull: No calvarial fracture or suspicious bone lesion. Skull base is unremarkable. Sinuses/Orbits: Orbits and paranasal sinuses are unremarkable. Bilateral mastoid effusions. Other: None. IMPRESSION: 1. No acute intracranial abnormality. 2. Unchanged mild chronic small-vessel disease. 3. Bilateral mastoid effusions. Electronically Signed   By: Orvan Falconer M.D.   On: 11/03/2022 11:16   DG Chest Portable 1 View  Result Date: 11/03/2022 CLINICAL DATA:  Respiratory distress EXAM: PORTABLE CHEST 1 VIEW COMPARISON:  Chest radiograph dated 09/14/2022 FINDINGS: Normal lung volumes. Moderate pulmonary edema. Increased dense left retrocardiac opacity. Trace bilateral pleural effusions. No pneumothorax. Similar marked cardiomegaly. No acute osseous abnormality. IMPRESSION: 1. Moderate pulmonary edema.  Trace bilateral pleural effusions. 2. Increased dense left retrocardiac opacity, likely atelectasis.  Aspiration or pneumonia can be considered in the appropriate clinical setting. 3. Similar marked cardiomegaly. Electronically Signed   By: Agustin Cree M.D.   On: 11/03/2022 10:42     Medications:    ampicillin-sulbactam (UNASYN) IV      aspirin EC  81 mg Oral Daily   diphenhydrAMINE  25 mg Intravenous Once   heparin  5,000 Units Subcutaneous Q8H   ipratropium-albuterol  3 mL Nebulization Q4H   acetaminophen, acetaminophen, albuterol, dextromethorphan-guaiFENesin, hydrALAZINE, ondansetron (ZOFRAN) IV  Assessment/ Plan:  [name] is a  62 y.o.  male with end stage renal disease on hemodialysis, hypertension, schizophrenia, DVT, COPD, who is admitted to [facility] on 11/03/2022 for Acute on chronic respiratory failure with hypoxia (HCC) [J96.21]  [facility] MWF [facility] Left AVF 79.5kg  End Stage Renal Disease: with volume overload. Placed on hemodialysis emergently. Then resume MWF schedule.   Hypertension with chronic kidney disease: home regimen of amlodipine, clonidine, doxazosin, hydralazine, minoxidil,.   Anemia with chronic kidney disease: hemoglobin 12.5. Mircera as outpatient.   Secondary Hyperparathyroidism:  - Calcium acetate   LOS: 0 Todd Brown 6/4/20243:13 PM

## 2022-11-03 NOTE — ED Provider Notes (Signed)
[facility] Provider Note    Event Date/Time   First MD Initiated Contact with Patient 11/03/22 [phone]     (approximate)   History   Respiratory Distress   HPI  Todd Brown is a 62 y.o. male with past medical history of end-stage renal disease on hemodialysis, COPD with chronic respiratory failure on 2 L at baseline, schizophrenia, hypertension who presents because of altered mental status and hypoxia.  Patient presents from his facility for hypoxia.  Apparently sats were in the 50s.  When EMS arrived he was satting in the 50s and was placed on CPAP but then became hypotensive so was transition to nonrebreather.  Apparently patient was initially responsive but then started to have decreased mental status.  They did attempt bagging but patient then asked them to stop.  I am unable to obtain any additional history from the patient given his altered mental status.  I reviewed last admission about a month and a half ago.  Patient has fairly similar presentation where he presented with rather abrupt onset of altered mental status.  He was febrile and hypoxic and was treated with high flow nasal cannula and antibiotics and was found to have strep bacteremia.  He had a negative TEE and negative white blood cell scan and was recommended to have a colonoscopy given the strep gallolyticus associated with colon cancer but he refused.  He was discharged on Ancef which she got at outpatient dialysis.  Patient's last dialysis session was apparently yesterday.     Past Medical History:  Diagnosis Date   Anemia    Arthritis    Chronic respiratory failure (HCC)    COPD (chronic obstructive pulmonary disease) (HCC)    Dialysis patient (HCC)    Mon. 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   ESRD (end stage renal disease) (HCC)    ESRD (end stage renal disease) on dialysis (HCC)    Hyperlipidemia    Hypertension     Malignant hypertension    Pneumonia 2016   Renal artery stenosis (HCC)    Schizophrenia (HCC)     Patient Active Problem List   Diagnosis Date Noted   Myocardial injury 11/03/2022   CAD (coronary artery disease) 11/03/2022   Hypothyroidism 11/03/2022   Thrombocytopenia (HCC) 11/03/2022   Aspiration pneumonia (HCC) 11/03/2022   Abdominal distension 11/03/2022   CAP (community acquired pneumonia) 09/14/2022   Anasarca (mild pulmonary edema, pleural and pericardial effusion, ascites on CT) 09/14/2022   Acute on chronic respiratory failure (HCC) 09/14/2022   Bacteremia 06/20/2022   Acute encephalopathy 06/19/2022   SIRS (systemic inflammatory response syndrome) (HCC) 06/19/2022   NSTEMI (non-ST elevated myocardial infarction) (HCC) 06/19/2022   Acute on chronic respiratory failure with hypoxia (HCC) 05/21/2022   Hypoxia 05/20/2022   COVID-19 virus infection 05/20/2022   Hyponatremia 02/10/2022   Anemia due to chronic kidney disease 01/21/2022   Hyperkalemia 01/14/2022   Malnutrition of moderate degree 12/24/2021   Oral thrush 12/22/2021   Physical deconditioning 12/20/2021   Acute metabolic encephalopathy 12/15/2021   Essential hypertension 12/15/2021   ESRD on hemodialysis (HCC) 12/15/2021   Altered mental status 10/08/2021   Hypertensive urgency 10/07/2021   COPD (chronic obstructive pulmonary disease) (HCC) 02/26/2021   GERD (gastroesophageal reflux disease) 02/26/2021   Hypertension 11/28/2020   Acute respiratory failure with hypoxia (HCC) 10/23/2019   Anemia of chronic disease 10/23/2019   Fluid overload 10/08/2018   Acute  pulmonary edema (HCC) 09/12/2018   Scrotal edema 05/10/2017   Symptomatic anemia 04/30/2017   Anticoagulated on Coumadin 09/14/2016   Cardiac arrest (HCC) 08/03/2016   Severe sepsis versus SIRS 05/26/2016   Dialysis patient (HCC) 04/15/2016   End stage renal disease (HCC) 02/12/2016   Hyperkalemia 05/20/2015   Hepatitis C 09/26/2013    Schizophrenia (HCC) 03/23/2013   Essential hypertension, benign 03/23/2013   Chronic kidney disease 03/23/2013     Physical Exam  Triage Vital Signs: ED Triage Vitals [11/03/22 0930]  Enc Vitals Group     BP      Pulse      Resp      Temp      Temp src      SpO2 99 %     Weight      Height      Head Circumference      Peak Flow      Pain Score      Pain Loc      Pain Edu?      Excl. in GC?     Most recent vital signs: Vitals:   11/03/22 1230 11/03/22 1300  BP: 104/81 (!) 148/87  Pulse:    Resp: 18 20  Temp:    SpO2:       General: Patient is somnolent, ill-appearing CV:  Edema in the bilateral lower extremities Resp:  Patient is tachypneic but has no significant increased work of breathing, decreased breath sounds throughout Abd:  Mildly distended, nontender Neuro:             Patient's eyes are closed and he is somnolent, does open his eyes to light stimuli, he does move both upper extremities and localizes to pain, groans Other:     ED Results / Procedures / Treatments  Labs (all labs ordered are listed, but only abnormal results are displayed) Labs Reviewed  CBC WITH DIFFERENTIAL/PLATELET - Abnormal; Notable for the following components:      Result Value   RBC 3.71 (*)    Hemoglobin 12.5 (*)    MCV 105.9 (*)    Platelets 136 (*)    Lymphs Abs 0.6 (*)    All other components within normal limits  BLOOD GAS, VENOUS - Abnormal; Notable for the following components:   pO2, Ven <31 (*)    Bicarbonate 37.6 (*)    Acid-Base Excess 10.8 (*)    All other components within normal limits  BRAIN NATRIURETIC PEPTIDE - Abnormal; Notable for the following components:   B Natriuretic Peptide 388.1 (*)    All other components within normal limits  BASIC METABOLIC PANEL - Abnormal; Notable for the following components:   Chloride 95 (*)    BUN 27 (*)    Creatinine, Ser 5.66 (*)    GFR, Estimated 11 (*)    Anion gap 16 (*)    All other components within normal  limits  LIPID PANEL - Abnormal; Notable for the following components:   Cholesterol 213 (*)    All other components within normal limits  TROPONIN I (HIGH SENSITIVITY) - Abnormal; Notable for the following components:   Troponin I (High Sensitivity) 28 (*)    All other components within normal limits  TROPONIN I (HIGH SENSITIVITY) - Abnormal; Notable for the following components:   Troponin I (High Sensitivity) 36 (*)    All other components within normal limits  SARS CORONAVIRUS 2 BY RT PCR  CULTURE, BLOOD (ROUTINE X 2)  CULTURE, BLOOD (ROUTINE  X 2)  EXPECTORATED SPUTUM ASSESSMENT W GRAM STAIN, RFLX TO RESP C  HEPATIC FUNCTION PANEL  PROCALCITONIN  LACTIC ACID, PLASMA  LACTIC ACID, PLASMA  PROTIME-INR  APTT  URINALYSIS, ROUTINE W REFLEX MICROSCOPIC  URINE DRUG SCREEN, QUALITATIVE ([facility] ONLY)  HEPATITIS B SURFACE ANTIGEN  HEPATITIS B SURFACE ANTIBODY, QUANTITATIVE  HEMOGLOBIN A1C     EKG  Reviewed interpreted patient's EKG which shows sinus rhythm with a right axis deviation low voltage in the limb leads no acute ischemic changes   RADIOLOGY  CXR interpreted by myself shows pulm edema and possible aspiration, cardiomegaly   PROCEDURES:  Critical Care performed: Yes, see critical care procedure note(s)  Procedures  The patient is on the cardiac monitor to evaluate for evidence of arrhythmia and/or significant heart rate changes.   MEDICATIONS ORDERED IN ED: Medications  ampicillin-sulbactam (UNASYN) 1.5 g in sodium chloride 0.9 % 100 mL IVPB (has no administration in time range)  albuterol (PROVENTIL) (2.5 MG/3ML) 0.083% nebulizer solution 2.5 mg (has no administration in time range)  ipratropium-albuterol (DUONEB) 0.5-2.5 (3) MG/3ML nebulizer solution 3 mL (3 mLs Nebulization Given 11/03/22 1237)  dextromethorphan-guaiFENesin (MUCINEX DM) 30-600 MG per 12 hr tablet 1 tablet (has no administration in time range)  ondansetron (ZOFRAN) injection 4 mg (has no  administration in time range)  hydrALAZINE (APRESOLINE) injection 5 mg (has no administration in time range)  acetaminophen (TYLENOL) tablet 650 mg (has no administration in time range)  acetaminophen (TYLENOL) suppository 650 mg (has no administration in time range)  aspirin EC tablet 81 mg (81 mg Oral Not Given 11/03/22 1225)  heparin injection 5,000 Units (5,000 Units Subcutaneous Given 11/03/22 1319)  Ampicillin-Sulbactam (UNASYN) 3 g in sodium chloride 0.9 % 100 mL IVPB (0 g Intravenous Stopped 11/03/22 1305)     IMPRESSION / MDM / ASSESSMENT AND PLAN / ED COURSE  I reviewed the triage vital signs and the nursing notes.                              Patient's presentation is most consistent with acute presentation with potential threat to life or bodily function.  Differential diagnosis includes, but is not limited to, uremia, hypoglycemia, hypoglycemia, CVA, intracranial hemorrhage, hypercarbia, hypoxia, infection  The patient is a 62 year old male with end-stage renal disease and COPD on 2 L at baseline who presents because of altered mental status.  Was also found to be hypoxic at his facility with sats in the 50s.  Was initially placed on CPAP by EMS but then dropped his pressure up so was transitioned to nonrebreather.  Mental status did worsen during transport they attempted bagging him but patient asked him to stop.  On arrival to ED patient is on a nonrebreather satting 100%.  He does look ill and is rather somnolent but he does open his eyes to light stimulus and seems to be moving all of his extremities.  Placed on nasal cannula at 3 L with sats in the mid 90s.  Does appear edematous in his lower extremities and has a distended abdomen that is nontender.  His work of breathing is slightly increased breath sounds are diminished throughout.  Formal bedside ultrasound given his hypotension and hypoxia and he does have a small pericardial effusion but no evidence of tamponade.  Right heart  does seem to be somewhat enlarged he has significant LV hypertrophy but EF looks to be globally normal.  Does have some slight B-lines  but not a ton.  Does have small volume ascites as well in the abdomen.  EKG shows no obvious ischemic changes.  Initial VBG without significant hypercarbia to explain his altered mental status.  Will obtain basic labs including CBC hepatic function panel troponin BNP.  Will obtain chest x-ray and a CT of his head.  I did review patient's recent admission and he has recently been treated for strep bacteremia which was thought to be possibly related to her colon cancer as there is no other source identified but patient refused colonoscopy.  Did complete IV Ancef as an outpatient.  Patient's labs overall reassuring.  No leukocytosis.  Procalcitonin is negative.  Electrolytes are okay.  Troponin mildly elevated at 28 which I suspect is in the setting of his end-stage renal disease.  BNP mildly elevated.  VBG does not suggest significant hypercarbia.  Does have an anion gap to 16 and BUN is not that elevated so I added on a lactate.  Given his recent history of strep bacteremia have also added on blood cultures.   Patient's chest x-ray does show pulmonary edema and possibly an aspiration versus atelectasis.  CT head is negative for acute abnormality.  On reassessment patient is still mentating quite poorly but he does open his eyes and does resist exam.  His tongue is hanging out of his mouth but does not appear significantly swollen.    This point still unclear by patient is still altered.  I have consulted Dr. Wynelle Link by way of secure chat to arrange for dialysis given patient's pulmonary edema.   FINAL CLINICAL IMPRESSION(S) / ED DIAGNOSES   Final diagnoses:  None     Rx / DC Orders   ED Discharge Orders     None        Note:  This document was prepared using Dragon voice recognition software and may include unintentional dictation errors.   Georga Hacking, MD 11/03/22 1356

## 2022-11-03 NOTE — ED Notes (Signed)
Lenn Sink from [facility] called back. She is acting as pts legal guardian at this time. Candice was update by this RN on pts condition and she states that if we have questions or concerns or consents to be signed to call her cell number list below  [employer]- [phone]

## 2022-11-03 NOTE — Progress Notes (Signed)
Called ED to receive report. Notified that patient just went to dialysis. Charge RN, Billey Gosling, and Secondary school teacher made aware.

## 2022-11-03 NOTE — ED Triage Notes (Signed)
Pt to ED via [facility] from [facility] for respiratory distress. Per EMS pt is a dialysis pt, pt got his full treatment yesterday and is due again tomorrow. Staff at facility reported that pt's SpO2 was 55% on his chronic 2 liters of oxygen. Pt was placed on a non-rebreather by fire with sats improving to the 90's. EMS reports that pt had fluid in all lung fields and pts abdomen is visibly distended. Pt was placed on CPAP by EMS at scene. Pt initial responsive to painful stimuli with EMS, however, EMS reports that pts mental status started to decline in route. Pts BP also started dropping so pt was taken off CPAP and placed on a non-breather. EMS reports that they attempted to bag the patient however pt pushed their hand away and asked them to stop.

## 2022-11-03 NOTE — Consult Note (Signed)
Pharmacy Antibiotic Note  Todd Brown is a 62 y.o. male admitted on 11/03/2022 with acute on chronic respiratory failure.  Patient has ESRD requiring dialysis.  Pharmacy has been consulted for Unasyn dosing for aspiration pneumonia.  Plan: Give Unasyn 3 grams IV x 1, then start Unasyn 1.5 grams IV every 12 hours.   Height: 5\' 11"  (180.3 cm) Weight: 75.4 kg (166 lb 3.6 oz) IBW/kg (Calculated) : 75.3  Temp (24hrs), Avg:97.8 F (36.6 C), Min:97.8 F (36.6 C), Max:97.8 F (36.6 C)  Recent Labs  Lab 11/03/22 0928 11/03/22 0936  WBC 4.9  --   CREATININE  --  5.66*    Estimated Creatinine Clearance: 14.4 mL/min (A) (by C-G formula based on SCr of 5.66 mg/dL (H)).    Allergies  Allergen Reactions   Chlorpromazine Other (See Comments)    Reaction:  Unknown , pt states it makes him feel real bad Reaction:  Unknown , pt states it makes him feel real bad     Antimicrobials this admission: Unasyn 6/4 >>    Dose adjustments this admission: N/A  Microbiology results: 6/4 BCx: pending  Thank you for allowing pharmacy to be a part of this patient's care.  Barrie Folk, PharmD 11/03/2022 12:08 PM

## 2022-11-03 NOTE — H&P (Addendum)
History and Physical    [name]:[id number] DOB: 04/21/1961 DOA: 11/03/2022  Referring MD/NP/PA:   PCP: Smiley Houseman, NP   Patient coming from:  The patient is coming from home, ALF   Chief Complaint: AMS, SOB  HPI: Todd Brown is a 62 y.o. male with medical history significant of ESRD-HD (MWF), schizophrenia, hypertension, hyperlipidemia, COPD on 2 L oxygen, CAD, hypothyroidism, GERD, remote right leg DVT 2018 not on anticoagulants currently, cardiac arrest, recent admission due to Strep bacteremia with negative TEE, who presents with shortness of breath and altered mental status.  Patient has AMS, is unable to provide any medical history, therefore, most of the history is obtained by discussing the case with ED physician, per EMS report, and with the nursing staff. I also called his legal guardian, Ms. Todd Brown by phone. Per Ms Lenn Sink, at normal baseline, patient is able to engage in conversation, knows his own name, knows the place he is living, not sure if patient is always orientated to time.  Today patient is very confused, not oriented x 3 currently.  He moves all extremities.  Per report, patient has shortness of breath with severe respiratory distress, with oxygen desaturated to 55% on room air, initially required nonrebreather.  His respiratory status has  improved in ED, currently on 3 L oxygen with 96% of saturation.  No active cough noted.  No active nausea vomiting, diarrhea noted.  On my examination, patient has significant abdominal distention, not sure if patient has abdominal pain or chest pain.  Of note, patient was recently hospitalized from 4/15 - 4/25 due to strep gallolyticus bacteremia. TEE was negative.  Patient finished a course of cefazolin. 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  Per her legal guardian, patient refused colonoscopy.   Data reviewed independently and ED  Course: pt was found to have WBC 4.9, negative COVID PCR, potassium 4.3, bicarbonate of 28, creatinine 5.66, BUN 27, blood sugar 88, temperature normal, blood pressure 104/77, heart rate 84, RR 24.  CT of head is negative for acute intracranial issues.  VBG with pH 7.42, CO2 58, O2 less than 31.  Pt is admitted to stepdown as inpatient.  Consulted Dr. Wynelle Link of renal.  CXR: 1. Moderate pulmonary edema.  Trace bilateral pleural effusions. 2. Increased dense left retrocardiac opacity, likely atelectasis. Aspiration or pneumonia can be considered in the appropriate clinical setting. 3. Similar marked cardiomegaly.   EKG: I have personally reviewed.  Sinus rhythm, QTc 412, low voltage, RAD   Review of Systems: Could not review due to altered mental status.   Allergy:  Allergies  Allergen Reactions   Chlorpromazine Other (See Comments)    Reaction:  Unknown , pt states it makes him feel real bad Reaction:  Unknown , pt states it makes him feel real bad     Past Medical History:  Diagnosis Date   Anemia    Arthritis    Chronic respiratory failure (HCC)    COPD (chronic obstructive pulmonary disease) (HCC)    Dialysis patient (HCC)    Mon. 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   ESRD (end stage renal disease) (HCC)    ESRD (end stage renal disease) on dialysis (HCC)    Hyperlipidemia    Hypertension    Malignant hypertension    Pneumonia 2016   Renal artery stenosis (HCC)    Schizophrenia (HCC)  Past Surgical History:  Procedure Laterality Date   A/V FISTULAGRAM N/A 08/06/2016   Procedure: A/V Fistulagram;  Surgeon: Annice Needy, MD;  Location: [facility];  Service: Cardiovascular;  Laterality: N/A;   A/V FISTULAGRAM N/A 02/14/2021   Procedure: A/V FISTULAGRAM poss Perm Cath Insertion;  Surgeon: Renford Dills, MD;  Location: [facility];  Service: Cardiovascular;  Laterality: N/A;   A/V FISTULAGRAM  Left 08/26/2021   Procedure: A/V Fistulagram;  Surgeon: Annice Needy, MD;  Location: [facility];  Service: Cardiovascular;  Laterality: Left;   A/V SHUNT INTERVENTION N/A 08/06/2016   Procedure: A/V Shunt Intervention;  Surgeon: Annice Needy, MD;  Location: [facility];  Service: Cardiovascular;  Laterality: N/A;   AV FISTULA PLACEMENT Left 12/26/2014   Procedure: ARTERIOVENOUS (AV) FISTULA CREATION;  Surgeon: Annice Needy, MD;  Location: [facility];  Service: Vascular;  Laterality: Left;   AV FISTULA PLACEMENT     ESOPHAGOGASTRODUODENOSCOPY (EGD) WITH PROPOFOL N/A 05/06/2017   Procedure: ESOPHAGOGASTRODUODENOSCOPY (EGD) WITH PROPOFOL;  Surgeon: Wyline Mood, MD;  Location: [facility];  Service: Gastroenterology;  Laterality: N/A;   INSERTION OF DIALYSIS CATHETER Right    PERIPHERAL VASCULAR CATHETERIZATION N/A 10/22/2014   Procedure: Dialysis/Perma Catheter Insertion;  Surgeon: Annice Needy, MD;  Location: [facility];  Service: Cardiovascular;  Laterality: N/A;   PERIPHERAL VASCULAR CATHETERIZATION N/A 02/28/2015   Procedure: Dialysis/Perma Catheter Removal;  Surgeon: Annice Needy, MD;  Location: [facility];  Service: Cardiovascular;  Laterality: N/A;   Repair fx left lower leg     yrs ago (age 63)   REVISON OF ARTERIOVENOUS FISTULA Left 03/27/2022   Procedure: REVISON OF ARTERIOVENOUS FISTULA (JUMP GRAFT);  Surgeon: Renford Dills, MD;  Location: [facility];  Service: Vascular;  Laterality: Left;   TEE WITHOUT CARDIOVERSION N/A 09/21/2022   Procedure: TRANSESOPHAGEAL ECHOCARDIOGRAM;  Surgeon: Debbe Odea, MD;  Location: [facility];  Service: Cardiovascular;  Laterality: N/A;   TONSILLECTOMY      Social History:  reports that he has never smoked. He has never been exposed to tobacco smoke. He has never used smokeless tobacco. He reports that he does not currently use alcohol. He reports that he does not currently use drugs after having used the  following drugs: Marijuana.  Family History:  Family History  Problem Relation Age of Onset   Kidney disease Brother    Diabetes Neg Hx      Prior to Admission medications   Medication Sig Start Date End Date Taking? Authorizing Provider  albuterol (VENTOLIN HFA) 108 (90 Base) MCG/ACT inhaler Inhale 1-2 puffs into the lungs every 4 (four) hours as needed for wheezing or shortness of breath.   Yes [provider]  amLODipine (NORVASC) 10 MG tablet Take 10 mg by mouth every morning.   Yes [provider]  ascorbic acid (VITAMIN C) 500 MG tablet Take 500 mg by mouth daily.   Yes [provider]  b complex-vitamin c-folic acid (NEPHRO-VITE) 0.8 MG TABS tablet Take 1 tablet by mouth at bedtime.   Yes [provider]  calcium acetate (PHOSLO) 667 MG capsule Take 1,334 mg by mouth 3 (three) times daily with meals. 0800/1400/2000   Yes [provider]  carbamazepine (TEGRETOL) 200 MG tablet Take 1,000 mg by mouth every morning.   Yes [provider]  cholecalciferol (VITAMIN D) 1000 UNITS tablet Take 1,000 Units by mouth in the morning.   Yes [provider]  cloNIDine (CATAPRES - DOSED IN MG/24 HR) 0.2 mg/24hr patch Place 1 patch (0.2 mg total) onto the skin once a week. 02/23/22  Yes Marrion Coy, MD  doxazosin (CARDURA) 4 MG tablet Take 1 tablet (4 mg total) by mouth at bedtime. 02/21/22  Yes Marrion Coy, MD  ferrous sulfate 325 (65 FE) MG tablet Take 1 tablet (325 mg total) by mouth daily. 05/19/15  Yes Enedina Finner, MD  fluPHENAZine (PROLIXIN) 2.5 MG tablet Take 2.5 mg by mouth in the morning.   Yes [provider]  Fluticasone-Salmeterol (ADVAIR) 250-50 MCG/DOSE AEPB Inhale 1 puff into the lungs 2 (two) times daily.   Yes [provider]  hydrALAZINE (APRESOLINE) 25 MG tablet Take 50 mg by mouth 3 (three) times daily. 0800/1400/2000   Yes [provider]  isosorbide mononitrate (IMDUR) 60 MG 24 hr tablet  Take 60 mg by mouth 2 (two) times daily.   Yes [provider]  levothyroxine (SYNTHROID) 75 MCG tablet Take 75 mcg by mouth every morning. 10/25/22  Yes [provider]  minoxidil (LONITEN) 2.5 MG tablet Take 1 tablet (2.5 mg total) by mouth daily. 02/22/22  Yes Marrion Coy, MD  naproxen (NAPROSYN) 500 MG tablet Take 500 mg by mouth 2 (two) times daily as needed for mild pain. 10/10/22  Yes [provider]  omeprazole (PRILOSEC) 40 MG capsule Take 40 mg by mouth every morning.   Yes [provider]  polyethylene glycol (MIRALAX / GLYCOLAX) 17 g packet Take 17 g by mouth daily as needed for mild constipation.   Yes [provider]  promethazine-dextromethorphan (PROMETHAZINE-DM) 6.25-15 MG/5ML syrup Take 5 mLs by mouth every 6 (six) hours as needed for cough.   Yes [provider]  trihexyphenidyl (ARTANE) 2 MG tablet Take 2 mg by mouth 2 (two) times daily.   Yes [provider]  zinc sulfate 220 (50 Zn) MG capsule Take 220 mg by mouth daily.   Yes [provider]  levothyroxine (SYNTHROID) 50 MCG tablet Take 1 tablet (50 mcg total) by mouth daily at 6 (six) AM. 07/01/22 09/15/22  Baron Hamper, MD    Physical Exam: Vitals:   11/03/22 1530 11/03/22 1600 11/03/22 1630 11/03/22 1700  BP: (!) 142/73 137/77 (!) 143/86 121/79  Pulse: 70 66 69 71  Resp: 16 16 17 17   Temp:      TempSrc:      SpO2: 90% 93% 91% 95%  Weight:      Height:       General: has acute respiratory distress. HEENT:       Eyes: PERRL, EOMI, no jaundice       ENT: No discharge from the ears and nose.       Neck: No JVD, no bruit, no mass felt. Heme: No neck lymph node enlargement. Cardiac: S1/S2, RRR, No murmurs, No gallops or rubs. Respiratory: Slightly decreased air movement bilaterally, has fine crackles bilaterally GI: has significant abdominal distention, nontender, no organomegaly, BS present. GU: No hematuria Ext: has trace leg edema  bilaterally. 1+DP/PT pulse bilaterally. Musculoskeletal: No joint deformities, No joint redness or warmth, no limitation of ROM in spin. Skin: No rashes.  Neuro: Patient has altered mental status, barely arousable, not oriented X3, not following command, moves all extremities. Psych: Patient is not psychotic, no suicidal or hemocidal ideation.  Labs on Admission: I have personally reviewed following labs and imaging studies  CBC: Recent Labs  Lab 11/03/22 0928  WBC 4.9  NEUTROABS  3.7  HGB 12.5*  HCT 39.3  MCV 105.9*  PLT 136*   Basic Metabolic Panel: Recent Labs  Lab 11/03/22 0936  NA 139  K 4.3  CL 95*  CO2 28  GLUCOSE 88  BUN 27*  CREATININE 5.66*  CALCIUM 9.4   GFR: Estimated Creatinine Clearance: 14.4 mL/min (A) (by C-G formula based on SCr of 5.66 mg/dL (H)). Liver Function Tests: Recent Labs  Lab 11/03/22 0936  AST 31  ALT 6  ALKPHOS 63  BILITOT 0.8  PROT 7.3  ALBUMIN 3.6   No results for input(s): "LIPASE", "AMYLASE" in the last 168 hours. No results for input(s): "AMMONIA" in the last 168 hours. Coagulation Profile: Recent Labs  Lab 11/03/22 1300  INR 1.1   Cardiac Enzymes: No results for input(s): "CKTOTAL", "CKMB", "CKMBINDEX", "TROPONINI" in the last 168 hours. BNP (last 3 results) No results for input(s): "PROBNP" in the last 8760 hours. HbA1C: No results for input(s): "HGBA1C" in the last 72 hours. CBG: No results for input(s): "GLUCAP" in the last 168 hours. Lipid Profile: Recent Labs    11/03/22 1145  CHOL 213*  HDL 97  LDLCALC 98  TRIG 91  CHOLHDL 2.2   Thyroid Function Tests: No results for input(s): "TSH", "T4TOTAL", "FREET4", "T3FREE", "THYROIDAB" in the last 72 hours. Anemia Panel: No results for input(s): "VITAMINB12", "FOLATE", "FERRITIN", "TIBC", "IRON", "RETICCTPCT" in the last 72 hours. Urine analysis:    Component Value Date/Time   COLORURINE YELLOW (A) 05/17/2015 0012   APPEARANCEUR HAZY (A) 05/17/2015 0012    LABSPEC 1.009 05/17/2015 0012   PHURINE 7.0 05/17/2015 0012   GLUCOSEU NEGATIVE 05/17/2015 0012   HGBUR 1+ (A) 05/17/2015 0012   BILIRUBINUR NEGATIVE 05/17/2015 0012   KETONESUR NEGATIVE 05/17/2015 0012   PROTEINUR 30 (A) 05/17/2015 0012   NITRITE NEGATIVE 05/17/2015 0012   LEUKOCYTESUR 3+ (A) 05/17/2015 0012   Sepsis Labs: @LABRCNTIP (procalcitonin:4,lacticidven:4) ) Recent Results (from the past 240 hour(s))  SARS Coronavirus 2 by RT PCR (hospital order, performed in [facility] hospital lab) *cepheid single result test* Anterior Nasal Swab     Status: None   Collection Time: 11/03/22  9:36 AM   Specimen: Anterior Nasal Swab  Result Value Ref Range Status   SARS Coronavirus 2 by RT PCR NEGATIVE NEGATIVE Final    Comment: (NOTE) SARS-CoV-2 target nucleic acids are NOT DETECTED.  The SARS-CoV-2 RNA is generally detectable in upper and lower respiratory specimens during the acute phase of infection. The lowest concentration of SARS-CoV-2 viral copies this assay can detect is 250 copies / mL. A negative result does not preclude SARS-CoV-2 infection and should not be used as the sole basis for treatment or other patient management decisions.  A negative result may occur with improper specimen collection / handling, submission of specimen other than nasopharyngeal swab, presence of viral mutation(s) within the areas targeted by this assay, and inadequate number of viral copies (<250 copies / mL). A negative result must be combined with clinical observations, patient history, and epidemiological information.  Fact Sheet for Patients:   RoadLapTop.co.za  Fact Sheet for Healthcare Providers: http://kim-miller.com/  This test is not yet approved or  cleared by the [place] FDA and has been authorized for detection and/or diagnosis of SARS-CoV-2 by FDA under an Emergency Use Authorization (EUA).  This EUA will remain in effect (meaning  this test can be used) for the duration of the COVID-19 declaration under Section 564(b)(1) of the Act, 21 U.S.C. section 360bbb-3(b)(1), unless the authorization  is terminated or revoked sooner.  Performed at [facility], [address] [place], [place] [address]      Radiological Exams on Admission: CT ABDOMEN PELVIS WO CONTRAST  Result Date: 11/03/2022 CLINICAL DATA:  Bowel obstruction suspected, agitation EXAM: CT CHEST, ABDOMEN AND PELVIS WITHOUT CONTRAST TECHNIQUE: Multidetector CT imaging of the chest, abdomen and pelvis was performed following the standard protocol without IV contrast. RADIATION DOSE REDUCTION: This exam was performed according to the departmental dose-optimization program which includes automated exposure control, adjustment of the mA and/or kV according to patient size and/or use of iterative reconstruction technique. COMPARISON:  09/14/2022 FINDINGS: CT CHEST FINDINGS Cardiovascular: Mild cardiomegaly. Moderately large pericardial effusion, increased since previous. Scattered coronary and aortic calcifications. Mediastinum/Nodes: No mass or adenopathy. Lungs/Pleura: Small pleural effusions left greater than right as before. No pneumothorax. Patchy atelectasis/consolidation in the lower lobes with air bronchograms, left worse than right, slightly increased from previous. Portal edema and some and atelectasis in the upper lobes, new since previous. Musculoskeletal: Healing manubrial fracture.  No acute findings. CT ABDOMEN PELVIS FINDINGS Hepatobiliary: Layering hyperdensities in the dependent aspect of the nondilated gallbladder. No focal liver lesion or biliary ductal dilatation. Pancreas: No mass or ductal dilatation. Increase in surrounding retroperitoneal edema/fluid. Spleen: Normal in size without focal abnormality. Adrenals/Urinary Tract: Bilateral adrenal hypertrophy. Bilateral renal parenchymal atrophy with extensive cystic change. No urolithiasis or  hydronephrosis. Urinary bladder nondistended. Stomach/Bowel: Stomach is incompletely distended, unremarkable. There is circumferential wall thickening in multiple loops of small bowel in the left mid abdomen. No dilated loops are evident however. Normal appendix. The colon is incompletely distended by gas and fecal material, without acute finding. Vascular/Lymphatic: Extensive aortoiliac calcified atheromatous plaque without aneurysm. No abdominal or pelvic adenopathy. Reproductive: Prostate is unremarkable. Other: Bilateral pelvic phleboliths. Moderate abdominal ascites, increased since previous. No free air. Musculoskeletal: No acute or significant osseous findings. IMPRESSION: 1. Moderately large pericardial effusion, increased since previous. 2. Small bilateral pleural effusions, increased since previous. 3. Worsening atelectasis/consolidation in the lower lobes, left worse than right. 4. Moderate abdominal ascites, increased since previous. 5. Circumferential wall thickening in multiple loops of small bowel in the left mid abdomen, possibly enteritis. No evidence of obstruction. 6.  Aortic Atherosclerosis (ICD10-I70.0). Electronically Signed   By: Corlis Leak M.D.   On: 11/03/2022 14:15   CT CHEST WO CONTRAST  Result Date: 11/03/2022 CLINICAL DATA:  Bowel obstruction suspected, agitation EXAM: CT CHEST, ABDOMEN AND PELVIS WITHOUT CONTRAST TECHNIQUE: Multidetector CT imaging of the chest, abdomen and pelvis was performed following the standard protocol without IV contrast. RADIATION DOSE REDUCTION: This exam was performed according to the departmental dose-optimization program which includes automated exposure control, adjustment of the mA and/or kV according to patient size and/or use of iterative reconstruction technique. COMPARISON:  09/14/2022 FINDINGS: CT CHEST FINDINGS Cardiovascular: Mild cardiomegaly. Moderately large pericardial effusion, increased since previous. Scattered coronary and aortic  calcifications. Mediastinum/Nodes: No mass or adenopathy. Lungs/Pleura: Small pleural effusions left greater than right as before. No pneumothorax. Patchy atelectasis/consolidation in the lower lobes with air bronchograms, left worse than right, slightly increased from previous. Portal edema and some and atelectasis in the upper lobes, new since previous. Musculoskeletal: Healing manubrial fracture.  No acute findings. CT ABDOMEN PELVIS FINDINGS Hepatobiliary: Layering hyperdensities in the dependent aspect of the nondilated gallbladder. No focal liver lesion or biliary ductal dilatation. Pancreas: No mass or ductal dilatation. Increase in surrounding retroperitoneal edema/fluid. Spleen: Normal in size without focal abnormality. Adrenals/Urinary Tract: Bilateral adrenal hypertrophy.  Bilateral renal parenchymal atrophy with extensive cystic change. No urolithiasis or hydronephrosis. Urinary bladder nondistended. Stomach/Bowel: Stomach is incompletely distended, unremarkable. There is circumferential wall thickening in multiple loops of small bowel in the left mid abdomen. No dilated loops are evident however. Normal appendix. The colon is incompletely distended by gas and fecal material, without acute finding. Vascular/Lymphatic: Extensive aortoiliac calcified atheromatous plaque without aneurysm. No abdominal or pelvic adenopathy. Reproductive: Prostate is unremarkable. Other: Bilateral pelvic phleboliths. Moderate abdominal ascites, increased since previous. No free air. Musculoskeletal: No acute or significant osseous findings. IMPRESSION: 1. Moderately large pericardial effusion, increased since previous. 2. Small bilateral pleural effusions, increased since previous. 3. Worsening atelectasis/consolidation in the lower lobes, left worse than right. 4. Moderate abdominal ascites, increased since previous. 5. Circumferential wall thickening in multiple loops of small bowel in the left mid abdomen, possibly  enteritis. No evidence of obstruction. 6.  Aortic Atherosclerosis (ICD10-I70.0). Electronically Signed   By: Corlis Leak M.D.   On: 11/03/2022 14:15   CT HEAD WO CONTRAST ( )  Result Date: 11/03/2022 CLINICAL DATA:  Mental status change, unknown cause. EXAM: CT HEAD WITHOUT CONTRAST TECHNIQUE: Contiguous axial images were obtained from the base of the skull through the vertex without intravenous contrast. RADIATION DOSE REDUCTION: This exam was performed according to the departmental dose-optimization program which includes automated exposure control, adjustment of the mA and/or kV according to patient size and/or use of iterative reconstruction technique. COMPARISON:  Head CT 06/19/2022. FINDINGS: Brain: No acute hemorrhage. Unchanged mild chronic small-vessel disease. Cortical gray-white differentiation is otherwise preserved. Prominence of the ventricles and sulci within expected range for age. No hydrocephalus or extra-axial collection. No mass effect or midline shift. Vascular: No hyperdense vessel or unexpected calcification. Skull: No calvarial fracture or suspicious bone lesion. Skull base is unremarkable. Sinuses/Orbits: Orbits and paranasal sinuses are unremarkable. Bilateral mastoid effusions. Other: None. IMPRESSION: 1. No acute intracranial abnormality. 2. Unchanged mild chronic small-vessel disease. 3. Bilateral mastoid effusions. Electronically Signed   By: Orvan Falconer M.D.   On: 11/03/2022 11:16   DG Chest Portable 1 View  Result Date: 11/03/2022 CLINICAL DATA:  Respiratory distress EXAM: PORTABLE CHEST 1 VIEW COMPARISON:  Chest radiograph dated 09/14/2022 FINDINGS: Normal lung volumes. Moderate pulmonary edema. Increased dense left retrocardiac opacity. Trace bilateral pleural effusions. No pneumothorax. Similar marked cardiomegaly. No acute osseous abnormality. IMPRESSION: 1. Moderate pulmonary edema.  Trace bilateral pleural effusions. 2. Increased dense left retrocardiac opacity,  likely atelectasis. Aspiration or pneumonia can be considered in the appropriate clinical setting. 3. Similar marked cardiomegaly. Electronically Signed   By: Agustin Cree M.D.   On: 11/03/2022 10:42      Assessment/Plan Principal Problem:   Acute on chronic respiratory failure with hypoxia (HCC) Active Problems:   Acute pulmonary edema (HCC)   Aspiration pneumonia (HCC)   COPD (chronic obstructive pulmonary disease) (HCC)   Acute metabolic encephalopathy   Bacteremia   ESRD on hemodialysis (HCC)   Myocardial injury   CAD (coronary artery disease)   Hypothyroidism   Thrombocytopenia (HCC)   Schizophrenia (HCC)   Abdominal distension   HTN (hypertension)   Pericardial effusion   Assessment and Plan:  Acute on chronic respiratory failure with hypoxia (HCC): Likely mainly due to pulmonary edema, as shown by chest x-ray that the patient has moderate pulmonary edema.  Cannot completely rule out possibility of aspiration pneumonia.  Initially required nonrebreather, currently on 3 L oxygen with 96% of saturation.  Consulted Dr. Wynelle Link of renal for urgent dialysis.  -  Admit to stepdown as inpatient -Bronchodilators -As needed Mucinex -Urgent dialysis per renal -Unasyn for possible aspiration pneumonia -Nasal cannula oxygen to maintain oxygen saturation above 93%  Acute pulmonary edema (HCC) -See above  Possible aspiration pneumonia [facility]): Chest x-ray showed increased dense left retrocardiac opacity, cannot completely rule out aspiration pneumonia. -Started Unasyn -Sputum culture -Blood culture  COPD (chronic obstructive pulmonary disease) (HCC): No wheezing or rhonchi, does not have COPD exacerbation -Bronchodilators  Acute metabolic encephalopathy: Etiology is not clear.  CT-head is negative.  VBG with CO2 58.  This presentation is similar to previous admission when patient was found to have strep gallolyticus bacteremia. -Frequent neurocheck -Follow-up blood culture -Fall  precaution  Recent bacteremia due to strep gallolyticus: -Repeat blood culture  ESRD on hemodialysis (MWF) -Consulted Dr. Wynelle Link for urgent dialysis  Myocardial injury and CAD (coronary artery disease): trop  28. -ASA -trend trop -check A1c and FLP  Hypothyroidism:  -synthroid  Thrombocytopenia (HCC): This seems to be a chronic issue.  Patient had thrombocytopenia in the past, platelet 128 on 06/21/2022.  Etiology is not clear.  No active bleeding. -Follow-up with CBC  Schizophrenia (HCC) -Continue home medications: Artane, fluphenazine, Tegretol  HTN: -IV hydralazine as needed -Amlodipine, Cardura, Imdur, minoxidil,  Abdominal distension: -Follow-up CT scan of abdomen/pelvis/chest  Addendum: CT scan of abdomen/pelvis/chest is negative for acute intra-abdominal issues, but showed moderately large pericardial effusion.  Currently patient is hemodynamically stable. -Consulted Dr. Darrold Junker of cardiology, they will see patient tomorrow  CT-chest/abdomen/pelvis 1. Moderately large pericardial effusion, increased since previous. 2. Small bilateral pleural effusions, increased since previous. 3. Worsening atelectasis/consolidation in the lower lobes, left worse than right. 4. Moderate abdominal ascites, increased since previous. 5. Circumferential wall thickening in multiple loops of small bowel in the left mid abdomen, possibly enteritis. No evidence of obstruction. 6.  Aortic Atherosclerosis (ICD10-I70.0).         DVT ppx: SQ Heparin    Code Status: Full code per his legal guardian  Family Communication:   Yes, patient's his legal guardian, Ms. Todd Golbble by phone  Disposition Plan:  Anticipate discharge back to previous environment, ALF  Consults called:  Consulted Dr. Wynelle Link of renal.  Consulted Dr. Darrold Junker of cardiology  Admission status and Level of care: Stepdown:   as inpt     Dispo: The patient is from: ALF              Anticipated d/c is to:  ALF              Anticipated d/c date is: 2 days              Patient currently is not medically stable to d/c.    Severity of Illness:  The appropriate patient status for this patient is INPATIENT. Inpatient status is judged to be reasonable and necessary in order to provide the required intensity of service to ensure the patient's safety. The patient's presenting symptoms, physical exam findings, and initial radiographic and laboratory data in the context of their chronic comorbidities is felt to place them at high risk for further clinical deterioration. Furthermore, it is not anticipated that the patient will be medically stable for discharge from the hospital within 2 midnights of admission.   * I certify that at the point of admission it is my clinical judgment that the patient will require inpatient hospital care spanning beyond 2 midnights from the point of admission due to high intensity of service, high risk for further deterioration and  high frequency of surveillance required.*       Date of Service 11/03/2022    Lorretta Harp [facility]   If 7PM-7AM, please contact night-coverage [email or web address] 11/03/2022, 5:46 PM

## 2022-11-03 NOTE — Progress Notes (Signed)
Hemodialysis note  Received patient in bed to unit. Patient is awake but eyes were closed. Doesn't answer assessment questions. Attempted to call legal guardian but was unsuccessful. Informed consent signed by the provider and in chart.  Treatment initiated: 1504 Treatment completed: 1808  Patient tolerated well. Transported back to room, alert without acute distress.  Report given to patient's RN.   Access used: LUA AVF Access issues: none  Total UF removed: 3L Medication(s) given:  none  Post HD weight: 77.9 kg   Wolfgang [facility]

## 2022-11-04 ENCOUNTER — Inpatient Hospital Stay
Admit: 2022-11-04 | Discharge: 2022-11-04 | Disposition: A | Discharge status: Home / Self Care | Payer: Medicaid Other | Attending: Cardiology | Admitting: Cardiology

## 2022-11-04 DIAGNOSIS — G9341 Metabolic encephalopathy: Secondary | ICD-10-CM | POA: Diagnosis not present

## 2022-11-04 DIAGNOSIS — E039 Hypothyroidism, unspecified: Secondary | ICD-10-CM

## 2022-11-04 DIAGNOSIS — J690 Pneumonitis due to inhalation of food and vomit: Secondary | ICD-10-CM

## 2022-11-04 DIAGNOSIS — N186 End stage renal disease: Secondary | ICD-10-CM | POA: Diagnosis not present

## 2022-11-04 DIAGNOSIS — J9621 Acute and chronic respiratory failure with hypoxia: Secondary | ICD-10-CM | POA: Diagnosis not present

## 2022-11-04 LAB — BASIC METABOLIC PANEL
Anion gap: 11 (ref 5–15)
BUN: 19 mg/dL (ref 8–23)
CO2: 31 mmol/L (ref 22–32)
Calcium: 9 mg/dL (ref 8.9–10.3)
Chloride: 96 mmol/L — ABNORMAL LOW (ref 98–111)
Creatinine, Ser: 4.89 mg/dL — ABNORMAL HIGH (ref 0.61–1.24)
GFR, Estimated: 13 mL/min — ABNORMAL LOW (ref >60)
Glucose, Bld: 71 mg/dL (ref 70–99)
Potassium: 4 mmol/L (ref 3.5–5.1)
Sodium: 138 mmol/L (ref 135–145)

## 2022-11-04 LAB — ECHOCARDIOGRAM LIMITED
Area-P 1/2: 1.61 cm2
Height: 71 in
S' Lateral: 2.1 cm
Weight: 2458.57 oz

## 2022-11-04 LAB — HEMOGLOBIN A1C
Hgb A1c MFr Bld: 5.9 % — ABNORMAL HIGH (ref 4.8–5.6)
Mean Plasma Glucose: 123 mg/dL

## 2022-11-04 LAB — HEPATITIS B SURFACE ANTIBODY, QUANTITATIVE: Hep B S AB Quant (Post): 143 m[IU]/mL (ref >9.9)

## 2022-11-04 LAB — CBC
HCT: 39.6 % (ref 39.0–52.0)
Hemoglobin: 12.9 g/dL — ABNORMAL LOW (ref 13.0–17.0)
MCH: 33.1 pg (ref 26.0–34.0)
MCHC: 32.6 g/dL (ref 30.0–36.0)
MCV: 101.5 fL — ABNORMAL HIGH (ref 80.0–100.0)
Platelets: 173 10*3/uL (ref 150–400)
RBC: 3.9 MIL/uL — ABNORMAL LOW (ref 4.22–5.81)
RDW: 15.1 % (ref 11.5–15.5)
WBC: 4.4 10*3/uL (ref 4.0–10.5)
nRBC: 0 % (ref 0.0–0.2)

## 2022-11-04 LAB — GLUCOSE, CAPILLARY: Glucose-Capillary: 77 mg/dL (ref 70–99)

## 2022-11-04 MED ORDER — TRIHEXYPHENIDYL HCL 2 MG PO TABS
2.0000 mg | ORAL_TABLET | Freq: Two times a day (BID) | ORAL | Status: DC
  Administered 2022-11-04 – 2022-11-07 (×5): 2 mg via ORAL
  Filled 2022-11-04 (×8): qty 1

## 2022-11-04 MED ORDER — SODIUM CHLORIDE 0.9 % IV SOLN: 3.0000 g | INTRAVENOUS | Status: DC

## 2022-11-04 MED ORDER — PANTOPRAZOLE SODIUM 40 MG PO TBEC
40.0000 mg | DELAYED_RELEASE_TABLET | Freq: Every day | ORAL | Status: DC
  Administered 2022-11-04 – 2022-11-07 (×4): 40 mg via ORAL
  Filled 2022-11-04 (×4): qty 1

## 2022-11-04 MED ORDER — FERROUS SULFATE 325 (65 FE) MG PO TABS
325.0000 mg | ORAL_TABLET | Freq: Every day | ORAL | Status: DC
  Administered 2022-11-04 – 2022-11-07 (×4): 325 mg via ORAL
  Filled 2022-11-04 (×4): qty 1

## 2022-11-04 MED ORDER — MINOXIDIL 2.5 MG PO TABS
2.5000 mg | ORAL_TABLET | Freq: Every day | ORAL | Status: DC
  Administered 2022-11-05: 2.5 mg via ORAL
  Filled 2022-11-04: qty 1

## 2022-11-04 MED ORDER — QUETIAPINE FUMARATE 25 MG PO TABS: 12.5000 mg | ORAL_TABLET | Freq: Every day | ORAL | Status: DC

## 2022-11-04 MED ORDER — FLUPHENAZINE HCL 2.5 MG PO TABS: 2.5000 mg | ORAL_TABLET | Freq: Every day | ORAL | Status: DC

## 2022-11-04 MED ORDER — RENA-VITE PO TABS
1.0000 | ORAL_TABLET | Freq: Every day | ORAL | Status: DC
  Administered 2022-11-04 – 2022-11-06 (×3): 1 via ORAL
  Filled 2022-11-04 (×3): qty 1

## 2022-11-04 MED ORDER — NAPROXEN 500 MG PO TABS: 500.0000 mg | ORAL_TABLET | Freq: Two times a day (BID) | ORAL | Status: DC | PRN

## 2022-11-04 MED ORDER — VITAMIN C 500 MG PO TABS
500.0000 mg | ORAL_TABLET | Freq: Every day | ORAL | Status: DC
  Administered 2022-11-04 – 2022-11-07 (×4): 500 mg via ORAL
  Filled 2022-11-04 (×4): qty 1

## 2022-11-04 MED ORDER — CARBAMAZEPINE 200 MG PO TABS
1000.0000 mg | ORAL_TABLET | Freq: Every morning | ORAL | Status: DC
  Administered 2022-11-05 – 2022-11-07 (×3): 1000 mg via ORAL
  Filled 2022-11-04 (×3): qty 5

## 2022-11-04 MED ORDER — ISOSORBIDE MONONITRATE ER 30 MG PO TB24
60.0000 mg | ORAL_TABLET | Freq: Two times a day (BID) | ORAL | Status: DC
  Administered 2022-11-04 – 2022-11-07 (×4): 60 mg via ORAL
  Filled 2022-11-04 (×5): qty 2

## 2022-11-04 MED ORDER — VITAMIN D 25 MCG (1000 UNIT) PO TABS
1000.0000 [IU] | ORAL_TABLET | Freq: Every morning | ORAL | Status: DC
  Administered 2022-11-05 – 2022-11-06 (×2): 1000 [IU] via ORAL
  Filled 2022-11-04 (×2): qty 1

## 2022-11-04 MED ORDER — TRIHEXYPHENIDYL HCL 2 MG PO TABS: 2.0000 mg | ORAL_TABLET | Freq: Two times a day (BID) | ORAL | Status: DC

## 2022-11-04 MED ORDER — ORAL CARE MOUTH RINSE: 15.0000 mL | OROMUCOSAL | Status: DC | PRN

## 2022-11-04 MED ORDER — HALOPERIDOL LACTATE 5 MG/ML IJ SOLN: 1.0000 mg | Freq: Four times a day (QID) | INTRAMUSCULAR | Status: DC | PRN

## 2022-11-04 MED ORDER — AMLODIPINE BESYLATE 10 MG PO TABS
10.0000 mg | ORAL_TABLET | Freq: Every morning | ORAL | Status: DC
  Administered 2022-11-05: 10 mg via ORAL
  Filled 2022-11-04: qty 1

## 2022-11-04 MED ORDER — DOXAZOSIN MESYLATE 4 MG PO TABS
4.0000 mg | ORAL_TABLET | Freq: Every day | ORAL | Status: DC
  Administered 2022-11-04 – 2022-11-06 (×2): 4 mg via ORAL
  Filled 2022-11-04 (×3): qty 1

## 2022-11-04 MED ORDER — RISPERIDONE 0.25 MG PO TABS
0.2500 mg | ORAL_TABLET | Freq: Two times a day (BID) | ORAL | Status: DC
  Filled 2022-11-04: qty 1

## 2022-11-04 MED ORDER — FLUPHENAZINE HCL 5 MG PO TABS
2.5000 mg | ORAL_TABLET | Freq: Every day | ORAL | Status: DC
  Administered 2022-11-05 – 2022-11-07 (×3): 2.5 mg via ORAL
  Filled 2022-11-04 (×4): qty 1

## 2022-11-04 MED ORDER — LEVOTHYROXINE SODIUM 50 MCG PO TABS: 75.0000 ug | ORAL_TABLET | Freq: Every day | ORAL | Status: DC

## 2022-11-04 MED ORDER — LEVOTHYROXINE SODIUM 50 MCG PO TABS
75.0000 ug | ORAL_TABLET | Freq: Every morning | ORAL | Status: DC
  Administered 2022-11-05: 75 ug via ORAL
  Filled 2022-11-04: qty 2

## 2022-11-04 MED ORDER — HYDRALAZINE HCL 50 MG PO TABS
50.0000 mg | ORAL_TABLET | Freq: Three times a day (TID) | ORAL | Status: DC
  Administered 2022-11-04 – 2022-11-05 (×2): 50 mg via ORAL
  Filled 2022-11-04 (×2): qty 1

## 2022-11-04 MED ORDER — QUETIAPINE FUMARATE 25 MG PO TABS
12.5000 mg | ORAL_TABLET | Freq: Every evening | ORAL | Status: DC | PRN
  Administered 2022-11-04 – 2022-11-05 (×2): 12.5 mg via ORAL
  Filled 2022-11-04 (×2): qty 1

## 2022-11-04 MED ORDER — ZINC SULFATE 220 (50 ZN) MG PO CAPS
220.0000 mg | ORAL_CAPSULE | Freq: Every day | ORAL | Status: DC
  Administered 2022-11-04 – 2022-11-07 (×4): 220 mg via ORAL
  Filled 2022-11-04 (×4): qty 1

## 2022-11-04 MED ORDER — MOMETASONE FURO-FORMOTEROL FUM 200-5 MCG/ACT IN AERO
2.0000 | INHALATION_SPRAY | Freq: Two times a day (BID) | RESPIRATORY_TRACT | Status: DC
  Administered 2022-11-04 – 2022-11-07 (×6): 2 via RESPIRATORY_TRACT
  Filled 2022-11-04: qty 8.8

## 2022-11-04 MED ORDER — CALCIUM ACETATE (PHOS BINDER) 667 MG PO CAPS
1334.0000 mg | ORAL_CAPSULE | Freq: Three times a day (TID) | ORAL | Status: DC
  Administered 2022-11-05 – 2022-11-07 (×5): 1334 mg via ORAL
  Filled 2022-11-04 (×6): qty 2

## 2022-11-04 NOTE — Progress Notes (Signed)
Received patient in bed to unit.    Informed consent signed and in chart.    TX duration:3.5     Transported By hospital transporter back to room Hand-off given to patient's nurse.    Access used: L AVF Access issues: None   Total UF removed: 3L Medication(s) given:None Post HD VS: Stable Post HD weight: 69.7kg    [facility]

## 2022-11-04 NOTE — Assessment & Plan Note (Addendum)
Continue Augmentin

## 2022-11-04 NOTE — Consult Note (Signed)
[facility] CLINIC CARDIOLOGY CONSULT NOTE       Patient ID: Todd Brown MRN: [id number] DOB/AGE: September 09, 1960 62 y.o.  Admit date: 11/03/2022 Referring Physician Dr. Clyde Lundborg  Primary Physician  Primary Cardiologist Dr. 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  Cardiology is consulted because of a moderate pericardial effusion seen on CT chest.  History is obtained entirely from the chart. The patient was up all night long with significant agitation and is in a deep sleep and snoring at my time of evaluation. He blinks his eyes open to sternal rub and turns head towards my voice but does not stay awake long enough to contribute to the history. He presented to [facility] ED via EMS on 11/03/2022 with altered mental status, not oriented x 3 reportedly.  Per the patient's legal guardian, the patient is normally able to engage in conversation, and is oriented to self and where he lives but still he oriented to time.  He arrived via EMS with shortness of breath and hypoxia with O2 sat 55% on room air (not wearing his baseline oxygen).  He initially required nonrebreather and has had 2 dialysis sessions with significant UF removed and is now down to his normal baseline of 2 L by nasal cannula.  Patient was extremely agitated and difficult to redirect per nursing staff overnight, verbally and physically uses to staff.  In the emergency department a CT chest was obtained which showed a moderately large pericardial effusion, small bilateral pleural effusions, and moderate abdominal ascites with other incidental findings.  Limited echocardiogram obtained today  revealed normal LVEF 65-70% with moderate LVH.  Moderate lateral pericardial effusion without sonographic evidence of cardiac tamponade, and a moderate pleural effusion.  Recent vitals notable for a blood pressure of 153/87, heart rate in the 60s in sinus rhythm on telemetry.  He is saturating well on his baseline of 2 L by nasal cannula.  Review of systems limited by patient's mentation.     Past Medical History:  Diagnosis Date   Anemia    Arthritis    Chronic respiratory failure (HCC)    COPD (chronic obstructive pulmonary disease) (HCC)    Dialysis patient (HCC)    Mon. 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   ESRD (end stage renal disease) (HCC)    ESRD (end stage renal disease) on dialysis (HCC)    Hyperlipidemia    Hypertension    Malignant hypertension    Pneumonia 2016   Renal artery stenosis (HCC)    Schizophrenia (HCC)     Past Surgical History:  Procedure Laterality Date   A/V FISTULAGRAM N/A 08/06/2016   Procedure: A/V Fistulagram;  Surgeon: Annice Needy, MD;  Location: [facility] INVASIVE CV LAB;  Service: Cardiovascular;  Laterality: N/A;   A/V FISTULAGRAM N/A 02/14/2021   Procedure: A/V FISTULAGRAM poss Perm Cath Insertion;  Surgeon: Renford Dills, MD;  Location: [facility] INVASIVE CV LAB;  Service: Cardiovascular;  Laterality: N/A;   A/V FISTULAGRAM Left 08/26/2021   Procedure: A/V Fistulagram;  Surgeon: Annice Needy, MD;  Location: [facility] INVASIVE CV LAB;  Service: Cardiovascular;  Laterality: Left;   A/V SHUNT INTERVENTION N/A 08/06/2016   Procedure: A/V Shunt Intervention;  Surgeon: Annice Needy, MD;  Location: [facility] INVASIVE CV LAB;  Service: Cardiovascular;  Laterality: N/A;   AV FISTULA PLACEMENT Left 12/26/2014   Procedure: ARTERIOVENOUS (AV) FISTULA CREATION;  Surgeon: Annice Needy, MD;  Location: [facility] ORS;  Service: Vascular;  Laterality: Left;   AV FISTULA PLACEMENT     ESOPHAGOGASTRODUODENOSCOPY (EGD) WITH PROPOFOL  N/A 05/06/2017   Procedure: ESOPHAGOGASTRODUODENOSCOPY (EGD) WITH PROPOFOL;  Surgeon: Wyline Mood, MD;  Location: [facility] ENDOSCOPY;  Service: Gastroenterology;  Laterality: N/A;   INSERTION OF DIALYSIS CATHETER Right    PERIPHERAL VASCULAR CATHETERIZATION N/A 10/22/2014   Procedure: Dialysis/Perma Catheter Insertion;  Surgeon: Annice Needy, MD;  Location: [facility] INVASIVE CV LAB;  Service: Cardiovascular;  Laterality: N/A;   PERIPHERAL VASCULAR CATHETERIZATION N/A 02/28/2015   Procedure: Dialysis/Perma Catheter Removal;  Surgeon: Annice Needy, MD;  Location: [facility] INVASIVE CV LAB;  Service: Cardiovascular;  Laterality: N/A;   Repair fx left lower leg     yrs ago (age 53)   REVISON OF ARTERIOVENOUS FISTULA Left 03/27/2022   Procedure: REVISON OF ARTERIOVENOUS FISTULA (JUMP GRAFT);  Surgeon: Renford Dills, MD;  Location: [facility] ORS;  Service: Vascular;  Laterality: Left;   TEE WITHOUT CARDIOVERSION N/A 09/21/2022   Procedure: TRANSESOPHAGEAL ECHOCARDIOGRAM;  Surgeon: Debbe Odea, MD;  Location: [facility] ORS;  Service: Cardiovascular;  Laterality: N/A;   TONSILLECTOMY      Medications Prior to Admission  Medication Sig Dispense Refill Last Dose   albuterol (VENTOLIN HFA) 108 (90 Base) MCG/ACT inhaler Inhale 1-2 puffs into the lungs every 4 (four) hours as needed for wheezing or shortness of breath.   11/03/2022   amLODipine (NORVASC) 10 MG tablet Take 10 mg by mouth every morning.   11/03/2022 at 0800   ascorbic acid (VITAMIN C) 500 MG tablet Take 500 mg by mouth daily.   11/03/2022 at 0800   b complex-vitamin c-folic acid (NEPHRO-VITE) 0.8 MG TABS tablet Take 1 tablet by mouth at bedtime.   11/03/2022 at 0800   calcium acetate (PHOSLO) 667 MG capsule Take 1,334 mg by mouth 3 (three) times daily with meals. 0800/1400/2000   11/03/2022 at 0800   carbamazepine (TEGRETOL) 200 MG tablet Take 1,000 mg by mouth every morning.   11/03/2022 at 0800   cholecalciferol (VITAMIN D) 1000 UNITS tablet Take 1,000 Units by  mouth in the morning.   11/03/2022 at 0800   cloNIDine (CATAPRES - DOSED IN MG/24 HR) 0.2 mg/24hr patch Place 1 patch (0.2 mg total) onto the skin once a week. 4 patch 0 Past Week   doxazosin (CARDURA) 4 MG tablet Take 1 tablet (4 mg total) by mouth at bedtime. 30 tablet 0 11/02/2022 at 2000   ferrous sulfate 325 (65 FE) MG tablet Take 1 tablet (325 mg total) by mouth daily. 30 tablet 3 11/03/2022 at 0800   fluPHENAZine (PROLIXIN) 2.5 MG tablet Take 2.5 mg by mouth in the morning.   11/03/2022 at 0800   Fluticasone-Salmeterol (ADVAIR) 250-50 MCG/DOSE AEPB Inhale 1 puff into the lungs 2 (two) times daily.   11/03/2022 at 0800   hydrALAZINE (APRESOLINE) 25 MG tablet Take 50 mg by mouth 3 (three) times daily. 0800/1400/2000   11/03/2022 at 0800   isosorbide mononitrate (IMDUR) 60 MG 24 hr tablet Take 60 mg by mouth 2 (two) times daily.   11/03/2022 at 0800   levothyroxine (SYNTHROID) 75 MCG tablet Take 75 mcg by mouth  every morning.   11/03/2022 at 0800   minoxidil (LONITEN) 2.5 MG tablet Take 1 tablet (2.5 mg total) by mouth daily. 30 tablet 0 11/03/2022 at 0800   naproxen (NAPROSYN) 500 MG tablet Take 500 mg by mouth 2 (two) times daily as needed for mild pain.   prn at unk   omeprazole (PRILOSEC) 40 MG capsule Take 40 mg by mouth every morning.   11/03/2022 at 0800   polyethylene glycol (MIRALAX / GLYCOLAX) 17 g packet Take 17 g by mouth daily as needed for mild constipation.   prn at unk   promethazine-dextromethorphan (PROMETHAZINE-DM) 6.25-15 MG/5ML syrup Take 5 mLs by mouth every 6 (six) hours as needed for cough.   prn at unk   trihexyphenidyl (ARTANE) 2 MG tablet Take 2 mg by mouth 2 (two) times daily.   11/03/2022 at 0800   zinc sulfate 220 (50 Zn) MG capsule Take 220 mg by mouth daily.   11/03/2022 at 0800   levothyroxine (SYNTHROID) 50 MCG tablet Take 1 tablet (50 mcg total) by mouth daily at 6 (six) AM. 30 tablet 0    Social History   Socioeconomic History   Marital status: Single    Spouse name: Not on file    Number of children: 0   Years of education: Not on file   Highest education level: Not on file  Occupational History   Occupation: Disability   Tobacco Use   Smoking status: Never    Passive exposure: Never   Smokeless tobacco: Never  Vaping Use   Vaping Use: Never used  Substance and Sexual Activity   Alcohol use: Not Currently   Drug use: Not Currently    Types: Marijuana    Comment: not since living at assisted living place 03/2020   Sexual activity: Not Currently  Other Topics Concern   Not on file  Social History Narrative   ** Merged History Encounter **       Living at [facility] assisted living [place], [place] [employer] is legal guardian    Social Determinants of Health   Financial Resource Strain: Low Risk  (10/08/2018)   Overall Financial Resource Strain (CARDIA)    Difficulty of Paying Living Expenses: Not hard at all  Food Insecurity: No Food Insecurity (09/16/2022)   Hunger Vital Sign    Worried About Running Out of Food in the Last Year: Never true    Ran Out of Food in the Last Year: Never true  Transportation Needs: No Transportation Needs (09/16/2022)   PRAPARE - Administrator, Civil Service (Medical): No    Lack of Transportation (Non-Medical): No  Physical Activity: Unknown (10/08/2018)   Exercise Vital Sign    Days of Exercise per Week: Patient declined    Minutes of Exercise per Session: Patient declined  Stress: No Stress Concern Present (10/08/2018)   [employer] of Occupational Health - Occupational Stress Questionnaire    Feeling of Stress : Not at all  Social Connections: Unknown (10/08/2018)   Social Connection and Isolation Panel [NHANES]    Frequency of Communication with Friends and Family: Patient declined    Frequency of Social Gatherings with Friends and Family: Patient declined    Attends Religious Services: Patient declined    Active Member of Clubs or Organizations: Patient declined    Attends  Banker Meetings: Patient declined    Marital Status: Patient declined  Intimate Partner Violence: Not At Risk (09/16/2022)   Humiliation, Afraid, Rape,  and Kick questionnaire    Fear of Current or Ex-Partner: No    Emotionally Abused: No    Physically Abused: No    Sexually Abused: No    Family History  Problem Relation Age of Onset   Kidney disease Brother    Diabetes Neg Hx       Intake/Output Summary (Last 24 hours) at 11/04/2022 1702 Last data filed at 11/04/2022 1155 Gross per 24 hour  Intake 220.02 ml  Output 6000 ml  Net -5779.98 ml    Vitals:   11/04/22 1230 11/04/22 1300 11/04/22 1400 11/04/22 1600  BP: 121/77 (!) 153/87 105/72 (!) 126/101  Pulse: 79  66 69  Resp: (!) 21  17 16   Temp: 97.9 F (36.6 C)   97.6 F (36.4 C)  TempSrc: Oral   Oral  SpO2: 92% 93% 94% 94%  Weight:      Height:        PHYSICAL EXAM General: Chronically ill-appearing middle-aged black male,  in no acute distress.  Sleeping and snoring with eyes closed, awakens briefly to loud voice and sternal rub but falls asleep quickly HEENT:  Normocephalic and atraumatic. Neck:  No JVD.  Lungs: Normal respiratory effort on 2L by [place].  Decreased breath sounds both bases without appreciable wheezes.   Heart: HRRR . Normal S1 and S2 without gallops or murmurs.  Abdomen: Soft, mildly distended. Msk: Normal strength and tone for age. Extremities: Warm and well perfused. No clubbing, cyanosis.  No peripheral edema.  Neuro: Somnolent, awakens eyes and turns head to voice and sternal rub Psych: Somnolent  Labs: Basic Metabolic Panel: Recent Labs    11/03/22 0936 11/04/22 0814  NA 139 138  K 4.3 4.0  CL 95* 96*  CO2 28 31  GLUCOSE 88 71  BUN 27* 19  CREATININE 5.66* 4.89*  CALCIUM 9.4 9.0   Liver Function Tests: Recent Labs    11/03/22 0936  AST 31  ALT 6  ALKPHOS 63  BILITOT 0.8  PROT 7.3  ALBUMIN 3.6   No results for input(s): "LIPASE", "AMYLASE" in the last 72  hours. CBC: Recent Labs    11/03/22 0928 11/04/22 0814  WBC 4.9 4.4  NEUTROABS 3.7  --   HGB 12.5* 12.9*  HCT 39.3 39.6  MCV 105.9* 101.5*  PLT 136* 173   Cardiac Enzymes: Recent Labs    11/03/22 0928 11/03/22 1145  TROPONINIHS 28* 36*   BNP: Recent Labs    11/03/22 0936  BNP 388.1*   D-Dimer: No results for input(s): "DDIMER" in the last 72 hours. Hemoglobin A1C: No results for input(s): "HGBA1C" in the last 72 hours. Fasting Lipid Panel: Recent Labs    11/03/22 1145  CHOL 213*  HDL 97  LDLCALC 98  TRIG 91  CHOLHDL 2.2   Thyroid Function Tests: No results for input(s): "TSH", "T4TOTAL", "T3FREE", "THYROIDAB" in the last 72 hours.  Invalid input(s): "FREET3" Anemia Panel: No results for input(s): "VITAMINB12", "FOLATE", "FERRITIN", "TIBC", "IRON", "RETICCTPCT" in the last 72 hours.   Radiology: ECHOCARDIOGRAM LIMITED  Result Date: 11/04/2022    ECHOCARDIOGRAM LIMITED REPORT   Patient Name:   Todd Brown Date of Exam: 11/04/2022 Medical Rec #:  [id number]        Height:       71.0 in Accession #:    [id number]       Weight:       153.7 lb Date of Birth:  Apr 19, 1961  BSA:          1.885 m Patient Age:    62 years         BP:           127/76 mmHg Patient Gender: M                HR:           65 bpm. Exam Location:  [facility] Procedure: Limited Echo, Color Doppler and Cardiac Doppler Indications:     Pericardial Effusion I31.3  History:         Patient has prior history of Echocardiogram examinations, most                  recent 09/17/2022. COPD; Risk Factors:Hypertension. Dialysis                  patient.  Sonographer:     Cristela Blue Referring Phys:  [id number] [facility] MICHELLE Annika Selke Diagnosing Phys: Marcina Millard MD IMPRESSIONS  1. Left ventricular ejection fraction, by estimation, is 65 to 70%. The left ventricle has normal function. There is moderate left ventricular hypertrophy.  2. Moderate pericardial effusion. The pericardial effusion is lateral to the left  ventricle. There is no evidence of cardiac tamponade. Moderate pleural effusion.  3. Mild mitral valve regurgitation.  4. Tricuspid valve regurgitation is mild to moderate. FINDINGS  Left Ventricle: Left ventricular ejection fraction, by estimation, is 65 to 70%. The left ventricle has normal function. There is moderate left ventricular hypertrophy. Pericardium: A moderately sized pericardial effusion is present. The pericardial effusion is lateral to the left ventricle. There is no evidence of cardiac tamponade. Mitral Valve: Mild mitral valve regurgitation. Tricuspid Valve: Tricuspid valve regurgitation is mild to moderate. Additional Comments: There is a moderate pleural effusion. Spectral Doppler performed. Color Doppler performed.  LEFT VENTRICLE PLAX 2D LVIDd:         4.00 cm LVIDs:         2.10 cm LV PW:         2.30 cm LV IVS:        2.10 cm LVOT diam:     2.30 cm LVOT Area:     4.15 cm  RIGHT VENTRICLE RV Basal diam:  4.60 cm RV Mid diam:    3.70 cm LEFT ATRIUM         Index LA diam:    3.10 cm 1.64 cm/m                        PULMONIC VALVE AORTA                 PR End Diast Vel: 12.25 msec Ao Root diam: 3.30 cm  MITRAL VALVE MV Area (PHT): 1.61 cm    SHUNTS MV Decel Time: 472 msec    Systemic Diam: 2.30 cm MV E velocity: 58.70 cm/s MV A velocity: 93.30 cm/s MV E/A ratio:  0.63 Marcina Millard MD Electronically signed by Marcina Millard MD Signature Date/Time: 11/04/2022/4:24:00 PM    Final    CT ABDOMEN PELVIS WO CONTRAST  Result Date: 11/03/2022 CLINICAL DATA:  Bowel obstruction suspected, agitation EXAM: CT CHEST, ABDOMEN AND PELVIS WITHOUT CONTRAST TECHNIQUE: Multidetector CT imaging of the chest, abdomen and pelvis was performed following the standard protocol without IV contrast. RADIATION DOSE REDUCTION: This exam was performed according to the departmental dose-optimization program which includes automated exposure control, adjustment of the mA and/or kV according to patient size  and/or  use of iterative reconstruction technique. COMPARISON:  09/14/2022 FINDINGS: CT CHEST FINDINGS Cardiovascular: Mild cardiomegaly. Moderately large pericardial effusion, increased since previous. Scattered coronary and aortic calcifications. Mediastinum/Nodes: No mass or adenopathy. Lungs/Pleura: Small pleural effusions left greater than right as before. No pneumothorax. Patchy atelectasis/consolidation in the lower lobes with air bronchograms, left worse than right, slightly increased from previous. Portal edema and some and atelectasis in the upper lobes, new since previous. Musculoskeletal: Healing manubrial fracture.  No acute findings. CT ABDOMEN PELVIS FINDINGS Hepatobiliary: Layering hyperdensities in the dependent aspect of the nondilated gallbladder. No focal liver lesion or biliary ductal dilatation. Pancreas: No mass or ductal dilatation. Increase in surrounding retroperitoneal edema/fluid. Spleen: Normal in size without focal abnormality. Adrenals/Urinary Tract: Bilateral adrenal hypertrophy. Bilateral renal parenchymal atrophy with extensive cystic change. No urolithiasis or hydronephrosis. Urinary bladder nondistended. Stomach/Bowel: Stomach is incompletely distended, unremarkable. There is circumferential wall thickening in multiple loops of small bowel in the left mid abdomen. No dilated loops are evident however. Normal appendix. The colon is incompletely distended by gas and fecal material, without acute finding. Vascular/Lymphatic: Extensive aortoiliac calcified atheromatous plaque without aneurysm. No abdominal or pelvic adenopathy. Reproductive: Prostate is unremarkable. Other: Bilateral pelvic phleboliths. Moderate abdominal ascites, increased since previous. No free air. Musculoskeletal: No acute or significant osseous findings. IMPRESSION: 1. Moderately large pericardial effusion, increased since previous. 2. Small bilateral pleural effusions, increased since previous. 3. Worsening  atelectasis/consolidation in the lower lobes, left worse than right. 4. Moderate abdominal ascites, increased since previous. 5. Circumferential wall thickening in multiple loops of small bowel in the left mid abdomen, possibly enteritis. No evidence of obstruction. 6.  Aortic Atherosclerosis (ICD10-I70.0). Electronically Signed   By: Corlis Leak M.D.   On: 11/03/2022 14:15   CT CHEST WO CONTRAST  Result Date: 11/03/2022 CLINICAL DATA:  Bowel obstruction suspected, agitation EXAM: CT CHEST, ABDOMEN AND PELVIS WITHOUT CONTRAST TECHNIQUE: Multidetector CT imaging of the chest, abdomen and pelvis was performed following the standard protocol without IV contrast. RADIATION DOSE REDUCTION: This exam was performed according to the departmental dose-optimization program which includes automated exposure control, adjustment of the mA and/or kV according to patient size and/or use of iterative reconstruction technique. COMPARISON:  09/14/2022 FINDINGS: CT CHEST FINDINGS Cardiovascular: Mild cardiomegaly. Moderately large pericardial effusion, increased since previous. Scattered coronary and aortic calcifications. Mediastinum/Nodes: No mass or adenopathy. Lungs/Pleura: Small pleural effusions left greater than right as before. No pneumothorax. Patchy atelectasis/consolidation in the lower lobes with air bronchograms, left worse than right, slightly increased from previous. Portal edema and some and atelectasis in the upper lobes, new since previous. Musculoskeletal: Healing manubrial fracture.  No acute findings. CT ABDOMEN PELVIS FINDINGS Hepatobiliary: Layering hyperdensities in the dependent aspect of the nondilated gallbladder. No focal liver lesion or biliary ductal dilatation. Pancreas: No mass or ductal dilatation. Increase in surrounding retroperitoneal edema/fluid. Spleen: Normal in size without focal abnormality. Adrenals/Urinary Tract: Bilateral adrenal hypertrophy. Bilateral renal parenchymal atrophy with  extensive cystic change. No urolithiasis or hydronephrosis. Urinary bladder nondistended. Stomach/Bowel: Stomach is incompletely distended, unremarkable. There is circumferential wall thickening in multiple loops of small bowel in the left mid abdomen. No dilated loops are evident however. Normal appendix. The colon is incompletely distended by gas and fecal material, without acute finding. Vascular/Lymphatic: Extensive aortoiliac calcified atheromatous plaque without aneurysm. No abdominal or pelvic adenopathy. Reproductive: Prostate is unremarkable. Other: Bilateral pelvic phleboliths. Moderate abdominal ascites, increased since previous. No free air. Musculoskeletal: No acute or significant osseous findings. IMPRESSION: 1.  Moderately large pericardial effusion, increased since previous. 2. Small bilateral pleural effusions, increased since previous. 3. Worsening atelectasis/consolidation in the lower lobes, left worse than right. 4. Moderate abdominal ascites, increased since previous. 5. Circumferential wall thickening in multiple loops of small bowel in the left mid abdomen, possibly enteritis. No evidence of obstruction. 6.  Aortic Atherosclerosis (ICD10-I70.0). Electronically Signed   By: Corlis Leak M.D.   On: 11/03/2022 14:15   CT HEAD WO CONTRAST ( )  Result Date: 11/03/2022 CLINICAL DATA:  Mental status change, unknown cause. EXAM: CT HEAD WITHOUT CONTRAST TECHNIQUE: Contiguous axial images were obtained from the base of the skull through the vertex without intravenous contrast. RADIATION DOSE REDUCTION: This exam was performed according to the departmental dose-optimization program which includes automated exposure control, adjustment of the mA and/or kV according to patient size and/or use of iterative reconstruction technique. COMPARISON:  Head CT 06/19/2022. FINDINGS: Brain: No acute hemorrhage. Unchanged mild chronic small-vessel disease. Cortical gray-white differentiation is otherwise  preserved. Prominence of the ventricles and sulci within expected range for age. No hydrocephalus or extra-axial collection. No mass effect or midline shift. Vascular: No hyperdense vessel or unexpected calcification. Skull: No calvarial fracture or suspicious bone lesion. Skull base is unremarkable. Sinuses/Orbits: Orbits and paranasal sinuses are unremarkable. Bilateral mastoid effusions. Other: None. IMPRESSION: 1. No acute intracranial abnormality. 2. Unchanged mild chronic small-vessel disease. 3. Bilateral mastoid effusions. Electronically Signed   By: Orvan Falconer M.D.   On: 11/03/2022 11:16   DG Chest Portable 1 View  Result Date: 11/03/2022 CLINICAL DATA:  Respiratory distress EXAM: PORTABLE CHEST 1 VIEW COMPARISON:  Chest radiograph dated 09/14/2022 FINDINGS: Normal lung volumes. Moderate pulmonary edema. Increased dense left retrocardiac opacity. Trace bilateral pleural effusions. No pneumothorax. Similar marked cardiomegaly. No acute osseous abnormality. IMPRESSION: 1. Moderate pulmonary edema.  Trace bilateral pleural effusions. 2. Increased dense left retrocardiac opacity, likely atelectasis. Aspiration or pneumonia can be considered in the appropriate clinical setting. 3. Similar marked cardiomegaly. Electronically Signed   By: Agustin Cree M.D.   On: 11/03/2022 10:42    TELEMETRY reviewed by me (LT) 11/04/2022 : NSR 60s-70s  EKG reviewed by me: NSR 78BPM  Data reviewed by me (LT) 11/04/2022: ed note, admission H&P, nursing notes last 24h vitals tele labs imaging I/O   Principal Problem:   Acute on chronic respiratory failure with hypoxia (HCC) Active Problems:   Schizophrenia (HCC)   Acute pulmonary edema (HCC)   COPD (chronic obstructive pulmonary disease) (HCC)   Acute metabolic encephalopathy   ESRD on hemodialysis (HCC)   Bacteremia   Myocardial injury   CAD (coronary artery disease)   Hypothyroidism   Thrombocytopenia (HCC)   Aspiration pneumonia (HCC)   Abdominal  distension   HTN (hypertension)   Pericardial effusion    ASSESSMENT AND PLAN:  Todd Brown is a 62yoM with a legal guardian and a PMH of ESRD, chronic respiratory failure (2L), schizophrenia, HTN, remote RLE DVT (2018, not on [facility]) with 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  Cardiology is consulted because of a moderate pericardial effusion seen on CT chest.  # Acute on chronic hypoxic respiratory failure # Acute on chronic HFpEF # ESRD on dialysis # Moderate lateral pericardial effusion Presents with altered mental status and significant agitation, hypoxic to 55% on room air, although the patient be on 2 L of oxygen by [place] continuously.  Initially required nonrebreather, has had  significant improvement in his respiratory status after 2 dialysis sessions with significant reduction in his weight as well.  The patient is hemodynamically stable, hypertensive, and heart rate in the 60s to 70s on telemetry in sinus rhythm.  Echocardiogram obtained shows a moderate lateral pericardial effusion without sonographic evidence of tamponade or clinical concern for this.  Pericardial effusion is of uncertain clinical significance and not uncommon in patients with ESRD. -Agree with current therapy per primary team -Volume removal via dialysis -No further cardiac diagnostics recommended at this time -Consider repeat echo in 3 months   Cardiology will sign off. Please haiku with questions or re-engage if needed.    This patient's plan of care was discussed and created with Dr. Darrold Junker and he is in agreement.  Signed: Rebeca Allegra , PA-C 11/04/2022, 5:02 PM [facility] Cardiology

## 2022-11-04 NOTE — Assessment & Plan Note (Addendum)
Resolved.  Platelet count in normal range 180.

## 2022-11-04 NOTE — Discharge Planning (Signed)
ESTABLISHED HEMODIALYSIS: Outpatient Facility [facility]  [address]  [place], [place] [address] [phone]  Scheduled Days: Monday Wednesday and Friday   Treatment Time: 11:30am

## 2022-11-04 NOTE — Assessment & Plan Note (Signed)
Only borderline troponins secondary to demand ischemia with heart failure.

## 2022-11-04 NOTE — Progress Notes (Addendum)
2020 patient arrived from ED on 3L [place] alert and yelling patient combative and angry that he has to stay here and they we have not feed him patient doesn't know date but also said he doesn't have to keep up with it. Patient eyes are white glazed and vision is impaired patient seems to see shadows will not let me hold eyes open for full exam. Patient physically and verbally abusive to staff approach with caution patient will hit you and start screaming randomly because he doesn't want to be here he says. Refusing breathing treatments. Trying to get out of bed.

## 2022-11-04 NOTE — Assessment & Plan Note (Addendum)
Patient answers some simple questions.Todd Brown

## 2022-11-04 NOTE — Progress Notes (Signed)
0430 patient combative yelling and trying to hit myself and lab patient refusing blood draw spent 15 minutes trying to get patient to allow blood draw unsuccessful.

## 2022-11-04 NOTE — Progress Notes (Signed)
*  PRELIMINARY RESULTS* Echocardiogram 2D Echocardiogram has been performed.  Todd Brown 11/04/2022, 1:42 PM

## 2022-11-04 NOTE — Assessment & Plan Note (Addendum)
TSH very elevated at 60.  Increased levothyroxine to 100 mcg daily and will likely have to increase to 125 prior to discharge.

## 2022-11-04 NOTE — Plan of Care (Signed)

## 2022-11-04 NOTE — Hospital Course (Addendum)
62 y.o. male with medical history significant of ESRD-HD (MWF), schizophrenia, hypertension, hyperlipidemia, COPD on 2 L oxygen, CAD, hypothyroidism, GERD, remote right leg DVT 2018 not on anticoagulants currently, cardiac arrest, recent admission due to Strep bacteremia with negative TEE, who presents with shortness of breath and altered mental status.   Patient has AMS, is unable to provide any medical history, therefore, most of the history is obtained by discussing the case with ED physician, per EMS report, and with the nursing staff. I also called his legal guardian, Ms. Candice Gobble by phone. Per Ms Lenn Sink, at normal baseline, patient is able to engage in conversation, knows his own name, knows the place he is living, not sure if patient is always orientated to time.  Today patient is very confused, not oriented x 3 currently.  He moves all extremities.  Per report, patient has shortness of breath with severe respiratory distress, with oxygen desaturated to 55% on room air, initially required nonrebreather.  His respiratory status has  improved in ED, currently on 3 L oxygen with 96% of saturation.  No active cough noted.  No active nausea vomiting, diarrhea noted.  On my examination, patient has significant abdominal distention, not sure if patient has abdominal pain or chest pain.   Of note, patient was recently hospitalized from 4/15 - 4/25 due to strep gallolyticus bacteremia. TEE was negative.  Patient finished a course of cefazolin. 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  Per her legal guardian, patient refused colonoscopy.  6/5.  After dialysis patient's weight is down to 153.66 pounds.  Patient is a poor historian. 6/6.  Patient's mental status much improved today.  TSH elevated at 60 will increase levothyroxine.  Blood pressure low this afternoon will get rid of minoxidil and cut back on hydralazine dosing. 6/7.  Holding  all blood pressure medications.   6/8.  Patient stable to go back to his facility.

## 2022-11-04 NOTE — Progress Notes (Signed)
Pt picked up for dialysis treatment, pt yelling asking to leave to go to his home. Pt transported with 2l [place] and on tele.

## 2022-11-04 NOTE — Assessment & Plan Note (Addendum)
Acute diastolic congestive heart failure.  Patient had 3 dialysis sessions.  Weight after dialysis 155.65 pounds.

## 2022-11-04 NOTE — Assessment & Plan Note (Addendum)
Patient had hemodialysis on admission, 2 days ago and having dialysis now.

## 2022-11-04 NOTE — Assessment & Plan Note (Signed)
No need for steroids.

## 2022-11-04 NOTE — Assessment & Plan Note (Signed)
Suspect all secondary to pulmonary edema.  Initially required a nonrebreather secondary to respiratory distress.  Now back to baseline 2 L.

## 2022-11-04 NOTE — Progress Notes (Signed)
[facility]  ROUNDING NOTE   Subjective:   [name] was admitted to [facility] on 11/03/2022 for Acute on chronic respiratory failure with hypoxia (HCC) [J96.21]  Patient seen and evaluated during dialysis   HEMODIALYSIS FLOWSHEET:  Blood Flow Rate (mL/min): 350 mL/min Arterial Pressure (mmHg): -110 mmHg Venous Pressure (mmHg): 230 mmHg TMP (mmHg): 13 mmHg Ultrafiltration Rate (mL/min): 1057 mL/min Dialysate Flow Rate (mL/min): 1057 ml/min  Resting quietly during treatment    Objective:  Vital signs in last 24 hours:  Temp:  [97.4 F (36.3 C)-97.8 F (36.6 C)] 97.5 F (36.4 C) (06/05 1155) Pulse Rate:  [53-81] 65 (06/05 1155) Resp:  [12-25] 20 (06/05 1155) BP: (107-162)/(67-103) 127/76 (06/05 1155) SpO2:  [90 %-97 %] 91 % (06/05 1155) Weight:  [69.7 kg-81 kg] 69.7 kg (06/05 1226)  Weight change:  Filed Weights   11/03/22 2034 11/04/22 0829 11/04/22 1226  Weight: 75 kg 72.7 kg 69.7 kg    Intake/Output: I/O last 3 completed shifts: In: 320 [P.O.:120; IV Piggyback:200] Out: 3000 [Other:3000]   Intake/Output this shift:  Total I/O In: -  Out: 3000 [Other:3000]  Physical Exam: General: NAD  Head: Normocephalic, atraumatic. Moist oral mucosal membranes  Eyes: Anicteric  Lungs:  +crackles  Heart: Regular rate and rhythm  Abdomen:  Soft, nontender  Extremities:  trace peripheral edema.  Neurologic: Nonfocal, moving all four extremities  Skin: No lesions  Access: Left AVF    Basic Metabolic Panel: Recent Labs  Lab 11/03/22 0936 11/04/22 0814  NA 139 138  K 4.3 4.0  CL 95* 96*  CO2 28 31  GLUCOSE 88 71  BUN 27* 19  CREATININE 5.66* 4.89*  CALCIUM 9.4 9.0     Liver Function Tests: Recent Labs  Lab 11/03/22 0936  AST 31  ALT 6  ALKPHOS 63  BILITOT 0.8  PROT 7.3  ALBUMIN 3.6    No results for input(s): "LIPASE", "AMYLASE" in the last 168 hours. No results for input(s): "AMMONIA" in the last 168 hours.  CBC: Recent Labs   Lab 11/03/22 0928 11/04/22 0814  WBC 4.9 4.4  NEUTROABS 3.7  --   HGB 12.5* 12.9*  HCT 39.3 39.6  MCV 105.9* 101.5*  PLT 136* 173     Cardiac Enzymes: No results for input(s): "CKTOTAL", "CKMB", "CKMBINDEX", "TROPONINI" in the last 168 hours.  BNP: Invalid input(s): "POCBNP"  CBG: Recent Labs  Lab 11/03/22 2020  GLUCAP 77    Microbiology: Results for orders placed or performed during the hospital encounter of 11/03/22  SARS Coronavirus 2 by RT PCR (hospital order, performed in [facility] hospital lab) *cepheid single result test* Anterior Nasal Swab     Status: None   Collection Time: 11/03/22  9:36 AM   Specimen: Anterior Nasal Swab  Result Value Ref Range Status   SARS Coronavirus 2 by RT PCR NEGATIVE NEGATIVE Final    Comment: (NOTE) SARS-CoV-2 target nucleic acids are NOT DETECTED.  The SARS-CoV-2 RNA is generally detectable in upper and lower respiratory specimens during the acute phase of infection. The lowest concentration of SARS-CoV-2 viral copies this assay can detect is 250 copies / mL. A negative result does not preclude SARS-CoV-2 infection and should not be used as the sole basis for treatment or other patient management decisions.  A negative result may occur with improper specimen collection / handling, submission of specimen other than nasopharyngeal swab, presence of viral mutation(s) within the areas targeted by this assay, and inadequate number of  viral copies (<250 copies / mL). A negative result must be combined with clinical observations, patient history, and epidemiological information.  Fact Sheet for Patients:   RoadLapTop.co.za  Fact Sheet for Healthcare Providers: http://kim-miller.com/  This test is not yet approved or  cleared by the [place] FDA and has been authorized for detection and/or diagnosis of SARS-CoV-2 by FDA under an Emergency Use Authorization (EUA).  This EUA will  remain in effect (meaning this test can be used) for the duration of the COVID-19 declaration under Section 564(b)(1) of the Act, 21 U.S.C. section 360bbb-3(b)(1), unless the authorization is terminated or revoked sooner.  Performed at [facility], [address] [place], [place] [address]   Blood culture (routine x 2)     Status: None (Preliminary result)   Collection Time: 11/03/22 11:45 AM   Specimen: BLOOD  Result Value Ref Range Status   Specimen Description BLOOD RIGHT HAND  Final   Special Requests   Final    BOTTLES DRAWN AEROBIC AND ANAEROBIC Blood Culture adequate volume   Culture   Final    NO GROWTH < 24 HOURS Performed at [facility], [address], [place], [place] [address]    Report Status PENDING  Incomplete  Blood culture (routine x 2)     Status: None (Preliminary result)   Collection Time: 11/03/22 11:47 AM   Specimen: BLOOD  Result Value Ref Range Status   Specimen Description BLOOD RIGHT ARM  Final   Special Requests   Final    BOTTLES DRAWN AEROBIC AND ANAEROBIC Blood Culture results may not be optimal due to an inadequate volume of blood received in culture bottles   Culture   Final    NO GROWTH < 24 HOURS Performed at [facility], [address] [place], [place] [address]    Report Status PENDING  Incomplete  MRSA Next Gen by PCR, Nasal     Status: None   Collection Time: 11/03/22  9:23 PM   Specimen: Nasal Mucosa; Nasal Swab  Result Value Ref Range Status   MRSA by PCR Next Gen NOT DETECTED NOT DETECTED Final    Comment: (NOTE) The GeneXpert MRSA Assay (FDA approved for NASAL specimens only), is one component of a comprehensive MRSA colonization surveillance program. It is not intended to diagnose MRSA infection nor to guide or monitor treatment for MRSA infections. Test performance is not FDA approved in patients less than 41 years old. Performed at [facility], [address] [place], [place]  [address]   Expectorated Sputum Assessment w Gram Stain, Rflx to Resp Cult     Status: None   Collection Time: 11/03/22  9:39 PM   Specimen: Sputum  Result Value Ref Range Status   Specimen Description SPUTUM  Final   Special Requests NONE  Final   Sputum evaluation   Final    Sputum specimen not acceptable for testing.  Please recollect.   C/ JENA SHANNON RN @2355  11/03/22 ASW Performed at [facility], [address] [place], [place] [address]    Report Status 11/03/2022 FINAL  Final    Coagulation Studies: Recent Labs    11/03/22 1300  LABPROT 13.9  INR 1.1     Urinalysis: No results for input(s): "COLORURINE", "LABSPEC", "PHURINE", "GLUCOSEU", "HGBUR", "BILIRUBINUR", "KETONESUR", "PROTEINUR", "UROBILINOGEN", "NITRITE", "LEUKOCYTESUR" in the last 72 hours.  Invalid input(s): "APPERANCEUR"    Imaging: CT ABDOMEN PELVIS WO CONTRAST  Result Date: 11/03/2022 CLINICAL DATA:  Bowel obstruction suspected,  agitation EXAM: CT CHEST, ABDOMEN AND PELVIS WITHOUT CONTRAST TECHNIQUE: Multidetector CT imaging of the chest, abdomen and pelvis was performed following the standard protocol without IV contrast. RADIATION DOSE REDUCTION: This exam was performed according to the departmental dose-optimization program which includes automated exposure control, adjustment of the mA and/or kV according to patient size and/or use of iterative reconstruction technique. COMPARISON:  09/14/2022 FINDINGS: CT CHEST FINDINGS Cardiovascular: Mild cardiomegaly. Moderately large pericardial effusion, increased since previous. Scattered coronary and aortic calcifications. Mediastinum/Nodes: No mass or adenopathy. Lungs/Pleura: Small pleural effusions left greater than right as before. No pneumothorax. Patchy atelectasis/consolidation in the lower lobes with air bronchograms, left worse than right, slightly increased from previous. Portal edema and some and atelectasis in the upper lobes, new since previous.  Musculoskeletal: Healing manubrial fracture.  No acute findings. CT ABDOMEN PELVIS FINDINGS Hepatobiliary: Layering hyperdensities in the dependent aspect of the nondilated gallbladder. No focal liver lesion or biliary ductal dilatation. Pancreas: No mass or ductal dilatation. Increase in surrounding retroperitoneal edema/fluid. Spleen: Normal in size without focal abnormality. Adrenals/Urinary Tract: Bilateral adrenal hypertrophy. Bilateral renal parenchymal atrophy with extensive cystic change. No urolithiasis or hydronephrosis. Urinary bladder nondistended. Stomach/Bowel: Stomach is incompletely distended, unremarkable. There is circumferential wall thickening in multiple loops of small bowel in the left mid abdomen. No dilated loops are evident however. Normal appendix. The colon is incompletely distended by gas and fecal material, without acute finding. Vascular/Lymphatic: Extensive aortoiliac calcified atheromatous plaque without aneurysm. No abdominal or pelvic adenopathy. Reproductive: Prostate is unremarkable. Other: Bilateral pelvic phleboliths. Moderate abdominal ascites, increased since previous. No free air. Musculoskeletal: No acute or significant osseous findings. IMPRESSION: 1. Moderately large pericardial effusion, increased since previous. 2. Small bilateral pleural effusions, increased since previous. 3. Worsening atelectasis/consolidation in the lower lobes, left worse than right. 4. Moderate abdominal ascites, increased since previous. 5. Circumferential wall thickening in multiple loops of small bowel in the left mid abdomen, possibly enteritis. No evidence of obstruction. 6.  Aortic Atherosclerosis (ICD10-I70.0). Electronically Signed   By: Corlis Leak M.D.   On: 11/03/2022 14:15   CT CHEST WO CONTRAST  Result Date: 11/03/2022 CLINICAL DATA:  Bowel obstruction suspected, agitation EXAM: CT CHEST, ABDOMEN AND PELVIS WITHOUT CONTRAST TECHNIQUE: Multidetector CT imaging of the chest, abdomen  and pelvis was performed following the standard protocol without IV contrast. RADIATION DOSE REDUCTION: This exam was performed according to the departmental dose-optimization program which includes automated exposure control, adjustment of the mA and/or kV according to patient size and/or use of iterative reconstruction technique. COMPARISON:  09/14/2022 FINDINGS: CT CHEST FINDINGS Cardiovascular: Mild cardiomegaly. Moderately large pericardial effusion, increased since previous. Scattered coronary and aortic calcifications. Mediastinum/Nodes: No mass or adenopathy. Lungs/Pleura: Small pleural effusions left greater than right as before. No pneumothorax. Patchy atelectasis/consolidation in the lower lobes with air bronchograms, left worse than right, slightly increased from previous. Portal edema and some and atelectasis in the upper lobes, new since previous. Musculoskeletal: Healing manubrial fracture.  No acute findings. CT ABDOMEN PELVIS FINDINGS Hepatobiliary: Layering hyperdensities in the dependent aspect of the nondilated gallbladder. No focal liver lesion or biliary ductal dilatation. Pancreas: No mass or ductal dilatation. Increase in surrounding retroperitoneal edema/fluid. Spleen: Normal in size without focal abnormality. Adrenals/Urinary Tract: Bilateral adrenal hypertrophy. Bilateral renal parenchymal atrophy with extensive cystic change. No urolithiasis or hydronephrosis. Urinary bladder nondistended. Stomach/Bowel: Stomach is incompletely distended, unremarkable. There is circumferential wall thickening in multiple loops of small bowel in the left mid abdomen. No dilated loops are evident  however. Normal appendix. The colon is incompletely distended by gas and fecal material, without acute finding. Vascular/Lymphatic: Extensive aortoiliac calcified atheromatous plaque without aneurysm. No abdominal or pelvic adenopathy. Reproductive: Prostate is unremarkable. Other: Bilateral pelvic phleboliths.  Moderate abdominal ascites, increased since previous. No free air. Musculoskeletal: No acute or significant osseous findings. IMPRESSION: 1. Moderately large pericardial effusion, increased since previous. 2. Small bilateral pleural effusions, increased since previous. 3. Worsening atelectasis/consolidation in the lower lobes, left worse than right. 4. Moderate abdominal ascites, increased since previous. 5. Circumferential wall thickening in multiple loops of small bowel in the left mid abdomen, possibly enteritis. No evidence of obstruction. 6.  Aortic Atherosclerosis (ICD10-I70.0). Electronically Signed   By: Corlis Leak M.D.   On: 11/03/2022 14:15   CT HEAD WO CONTRAST ( )  Result Date: 11/03/2022 CLINICAL DATA:  Mental status change, unknown cause. EXAM: CT HEAD WITHOUT CONTRAST TECHNIQUE: Contiguous axial images were obtained from the base of the skull through the vertex without intravenous contrast. RADIATION DOSE REDUCTION: This exam was performed according to the departmental dose-optimization program which includes automated exposure control, adjustment of the mA and/or kV according to patient size and/or use of iterative reconstruction technique. COMPARISON:  Head CT 06/19/2022. FINDINGS: Brain: No acute hemorrhage. Unchanged mild chronic small-vessel disease. Cortical gray-white differentiation is otherwise preserved. Prominence of the ventricles and sulci within expected range for age. No hydrocephalus or extra-axial collection. No mass effect or midline shift. Vascular: No hyperdense vessel or unexpected calcification. Skull: No calvarial fracture or suspicious bone lesion. Skull base is unremarkable. Sinuses/Orbits: Orbits and paranasal sinuses are unremarkable. Bilateral mastoid effusions. Other: None. IMPRESSION: 1. No acute intracranial abnormality. 2. Unchanged mild chronic small-vessel disease. 3. Bilateral mastoid effusions. Electronically Signed   By: Orvan Falconer M.D.   On: 11/03/2022  11:16   DG Chest Portable 1 View  Result Date: 11/03/2022 CLINICAL DATA:  Respiratory distress EXAM: PORTABLE CHEST 1 VIEW COMPARISON:  Chest radiograph dated 09/14/2022 FINDINGS: Normal lung volumes. Moderate pulmonary edema. Increased dense left retrocardiac opacity. Trace bilateral pleural effusions. No pneumothorax. Similar marked cardiomegaly. No acute osseous abnormality. IMPRESSION: 1. Moderate pulmonary edema.  Trace bilateral pleural effusions. 2. Increased dense left retrocardiac opacity, likely atelectasis. Aspiration or pneumonia can be considered in the appropriate clinical setting. 3. Similar marked cardiomegaly. Electronically Signed   By: Agustin Cree M.D.   On: 11/03/2022 10:42     Medications:    ampicillin-sulbactam (UNASYN) IV 1.5 g (11/04/22 1300)    aspirin EC  81 mg Oral Daily   Chlorhexidine Gluconate Cloth  6 each Topical Q0600   diphenhydrAMINE  25 mg Intravenous Once   heparin  5,000 Units Subcutaneous Q8H   ipratropium-albuterol  3 mL Nebulization Q4H   acetaminophen, acetaminophen, albuterol, dextromethorphan-guaiFENesin, hydrALAZINE, ondansetron (ZOFRAN) IV, mouth rinse  Assessment/ Plan:  [name] is a 62 y.o.  male with end stage renal disease on hemodialysis, hypertension, schizophrenia, DVT, COPD, who is admitted to [facility] on 11/03/2022 for Acute on chronic respiratory failure with hypoxia (HCC) [J96.21]  [facility] MWF [facility] Left AVF 79.5kg  End Stage Renal Disease: with volume overload. Received extra dialysis treatment yesterday, 3L achieved. Receiving dialysis today with UF goal 3L as tolerated.   Hypertension with chronic kidney disease: home regimen of amlodipine, clonidine, doxazosin, hydralazine, minoxidil,.  Blood pressure 142/92 during dialysis  Anemia with chronic kidney disease: hemoglobin 12.5. Mircera as outpatient.   Secondary Hyperparathyroidism:  - Calcium acetate   LOS: 1    6/5/20241:05 PM

## 2022-11-04 NOTE — Assessment & Plan Note (Signed)
Patient on artene and Prolixin.

## 2022-11-04 NOTE — Progress Notes (Signed)
Progress Note   Patient: Todd Brown:[id number] DOB: 1960/11/28 DOA: 11/03/2022     1 DOS: the patient was seen and examined on 11/04/2022   Brief hospital course: 62 y.o. male with medical history significant of ESRD-HD (MWF), schizophrenia, hypertension, hyperlipidemia, COPD on 2 L oxygen, CAD, hypothyroidism, GERD, remote right leg DVT 2018 not on anticoagulants currently, cardiac arrest, recent admission due to Strep bacteremia with negative TEE, who presents with shortness of breath and altered mental status.   Patient has AMS, is unable to provide any medical history, therefore, most of the history is obtained by discussing the case with ED physician, per EMS report, and with the nursing staff. I also called his legal guardian, Ms. Candice Gobble by phone. Per Ms Lenn Sink, at normal baseline, patient is able to engage in conversation, knows his own name, knows the place he is living, not sure if patient is always orientated to time.  Today patient is very confused, not oriented x 3 currently.  He moves all extremities.  Per report, patient has shortness of breath with severe respiratory distress, with oxygen desaturated to 55% on room air, initially required nonrebreather.  His respiratory status has  improved in ED, currently on 3 L oxygen with 96% of saturation.  No active cough noted.  No active nausea vomiting, diarrhea noted.  On my examination, patient has significant abdominal distention, not sure if patient has abdominal pain or chest pain.   Of note, patient was recently hospitalized from 4/15 - 4/25 due to strep gallolyticus bacteremia. TEE was negative.  Patient finished a course of cefazolin. 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  Per her legal guardian, patient refused colonoscopy.  6/5.  After dialysis patient's weight is down to 153.66 pounds.  Patient is a poor historian.  Assessment and Plan: * Acute on  chronic respiratory failure with hypoxia (HCC) Suspect all secondary to pulmonary edema.  Initially required a nonrebreather secondary to respiratory distress.  Now back to baseline 2 L.  Acute pulmonary edema (HCC) Acute diastolic congestive heart failure.  Patient had 2 dialysis sessions.  Weight is now down to 153.66 pounds.  Aspiration pneumonia (HCC) Patient on Unasyn but suspect secondary to heart failure.  COPD (chronic obstructive pulmonary disease) (HCC) No need for steroids.  Acute metabolic encephalopathy Patient answers a few questions.  Bacteremia On previous hospitalization on 4/15 had Streptococcus gallolyticus in blood cultures.  Repeat blood cultures on 09/17/22 were negative for 5 days.  Blood cultures so far negative this hospitalization.  ESRD on hemodialysis [facility]) Patient had hemodialysis on admission and again today.  Myocardial injury Only borderline troponins secondary to demand ischemia with heart failure.  Hypothyroidism Will check a TSH.  Restart his levothyroxine.  Thrombocytopenia (HCC) Platelet count in normal range 173        Subjective: Patient answers a few questions but falls back asleep.  Admitted with heart failure exacerbation  Physical Exam: Vitals:   11/04/22 1226 11/04/22 1230 11/04/22 1300 11/04/22 1400  BP:  121/77 (!) 153/87 105/72  Pulse:  79  66  Resp:  (!) 21  17  Temp:  97.9 F (36.6 C)    TempSrc:  Oral    SpO2:  92% 93% 94%  Weight: 69.7 kg     Height:       Physical Exam HENT:     Head: Normocephalic.     Mouth/Throat:     Pharynx: No oropharyngeal exudate.  Eyes:     General: Lids are normal.     Conjunctiva/sclera: Conjunctivae normal.  Cardiovascular:     Rate and Rhythm: Normal rate and regular rhythm.     Heart sounds: Normal heart sounds, S1 normal and S2 normal.  Pulmonary:     Breath sounds: Examination of the right-lower field reveals decreased breath sounds. Examination of the left-lower field  reveals decreased breath sounds. Decreased breath sounds present. No wheezing, rhonchi or rales.  Abdominal:     Palpations: Abdomen is soft.     Tenderness: There is no abdominal tenderness.  Musculoskeletal:     Right lower leg: No swelling.     Left lower leg: No swelling.  Skin:    General: Skin is warm.     Findings: No rash.  Neurological:     Mental Status: He is lethargic.     Data Reviewed: Creatinine 4.89, hemoglobin 12.9  Family Communication: Left message for guardian  Disposition: Status is: Inpatient Remains inpatient appropriate because: Will try to get sleeping tonight  Planned Discharge Destination: Long-term care    Time spent: 27 minutes  Author: Alford Highland, MD 11/04/2022 4:33 PM  For on call review [email or web address].

## 2022-11-04 NOTE — Assessment & Plan Note (Signed)
On previous hospitalization on 4/15 had Streptococcus gallolyticus in blood cultures.  Repeat blood cultures on 09/17/22 were negative for 5 days.  Blood cultures so far negative this hospitalization.

## 2022-11-05 DIAGNOSIS — G9341 Metabolic encephalopathy: Secondary | ICD-10-CM | POA: Diagnosis not present

## 2022-11-05 DIAGNOSIS — N186 End stage renal disease: Secondary | ICD-10-CM | POA: Diagnosis not present

## 2022-11-05 DIAGNOSIS — J9621 Acute and chronic respiratory failure with hypoxia: Secondary | ICD-10-CM | POA: Diagnosis not present

## 2022-11-05 DIAGNOSIS — I10 Essential (primary) hypertension: Secondary | ICD-10-CM

## 2022-11-05 DIAGNOSIS — I3139 Other pericardial effusion (noninflammatory): Secondary | ICD-10-CM

## 2022-11-05 DIAGNOSIS — J449 Chronic obstructive pulmonary disease, unspecified: Secondary | ICD-10-CM

## 2022-11-05 DIAGNOSIS — F209 Schizophrenia, unspecified: Secondary | ICD-10-CM

## 2022-11-05 LAB — TSH: TSH: 60 u[IU]/mL — ABNORMAL HIGH (ref 0.350–4.500)

## 2022-11-05 LAB — CULTURE, BLOOD (ROUTINE X 2)
Culture: NO GROWTH
Special Requests: ADEQUATE

## 2022-11-05 MED ORDER — LEVOTHYROXINE SODIUM 100 MCG PO TABS
100.0000 ug | ORAL_TABLET | Freq: Every morning | ORAL | Status: DC
  Administered 2022-11-06 – 2022-11-07 (×2): 100 ug via ORAL
  Filled 2022-11-05 (×2): qty 1

## 2022-11-05 MED ORDER — HYDRALAZINE HCL 10 MG PO TABS: 10.0000 mg | ORAL_TABLET | Freq: Three times a day (TID) | ORAL | Status: DC

## 2022-11-05 MED ORDER — AMOXICILLIN-POT CLAVULANATE 500-125 MG PO TABS
1.0000 | ORAL_TABLET | Freq: Two times a day (BID) | ORAL | Status: DC
  Administered 2022-11-05 – 2022-11-07 (×5): 1 via ORAL
  Filled 2022-11-05 (×5): qty 1

## 2022-11-05 NOTE — Evaluation (Addendum)
Physical Therapy Evaluation Patient Details Name: Todd Brown MRN: [id number] DOB: [birth year] Today's Date: 11/05/2022  History of Present Illness  Pt is a 62 y.o. male with medical history significant of ESRD-HD (MWF), schizophrenia, hypertension, hyperlipidemia, COPD on 2 L oxygen, CAD, hypothyroidism, GERD, remote right leg DVT 2018 not on anticoagulants currently, cardiac arrest. Pt admitted to hospital 6/4 with concerns of SOB, AMS, acute on chronic respiratory failure with hypoxia.  Clinical Impression  Pt was asleep in bed upon arrival, awoke with verbal stimulation. Interactions with pt limited by lethargy and cognitive impairments. Pt required max to total A + 2 for bed mobility tasks, although it was difficult to determine if due to impaired cognition or weakness. Pt able to maintain sitting balance with BUE and BLE support in bouts but had occasional losses of balance requiring support to keep from falling. He required mod A for STS with heavy cueing and assistance for sequencing, forward momentum and elevation to upright standing. Once standing he was only able to maintain for a max of ~15 seconds before sitting back down. Pt unlikely to benefit from continued PT services at this time due to cognitive deficits and lethargy.      Recommendations for follow up therapy are one component of a multi-disciplinary discharge planning process, led by the attending physician.  Recommendations may be updated based on patient status, additional functional criteria and insurance authorization.  Follow Up Recommendations       Assistance Recommended at Discharge Frequent or constant Supervision/Assistance  Patient can return home with the following  Two people to help with walking and/or transfers;Two people to help with bathing/dressing/bathroom;Direct supervision/assist for medications management;Help with stairs or ramp for entrance;Assist for transportation;Assistance with feeding     Equipment Recommendations Other (comment) (TBD)  Recommendations for Other Services       Functional Status Assessment Patient has had a recent decline in their functional status and/or demonstrates limited ability to make significant improvements in function in a reasonable and predictable amount of time     Precautions / Restrictions Precautions Precautions: None Restrictions Weight Bearing Restrictions: No      Mobility  Bed Mobility Overal bed mobility: Needs Assistance Bed Mobility: Supine to Sit, Sit to Supine     Supine to sit: Max assist, +2 for physical assistance Sit to supine: Total assist, +2 for physical assistance   General bed mobility comments: Pt required max to total A for bed mobility tasks, difficult to determine if secondary to weakness vs. cognitive impairments or lethargy    Transfers Overall transfer level: Needs assistance Equipment used: Rolling walker (2 wheels) Transfers: Sit to/from Stand Sit to Stand: Mod assist           General transfer comment: Pt required heavy cueing for proper sequencing and UE/LE placement, attempts to rise with commands, mod A for forward momentum and elevation up to walker. Pt also has visual impairments so needs assistance finding walker handles upon standing. Pt able to stand ~15 seconds on first attempt, unsuccessful on subsequent two attempts with pt requiring max A but still could not attain full upright position.    Ambulation/Gait               General Gait Details: NT, unsafe  Stairs            Wheelchair Mobility    Modified Rankin (Stroke Patients Only)       Balance Overall balance assessment: Needs assistance Sitting-balance support: Bilateral upper extremity supported,  Feet supported Sitting balance-[name]: Poor Sitting balance - Comments: Pt has limited ability to maintain sitting balance, demonstrates significant posterior lean and occasional LOB requiring assistance to  prevent fall Postural control: Posterior lean Standing balance support: Reliant on assistive device for balance Standing balance-[name]: Poor Standing balance comment: Pt dependent on RW upon standing, pt able to maintain standing for about 15 seconds on first attempt, only 4-5 seconds on last two attempts                             Pertinent Vitals/Pain Pain Assessment Pain Assessment: No/denies pain    Home Living Family/patient expects to be discharged to:: Assisted living                   Additional Comments: Unable to determine due to pt's cognitive impairments.    Prior Function Prior Level of Function : Patient poor historian/Family not available        Per previous documentation, pt uses walker around ALF, unsure distance             Hand Dominance   Dominant Hand: Right    Extremity/Trunk Assessment   Upper Extremity Assessment Upper Extremity Assessment: Generalized weakness    Lower Extremity Assessment Lower Extremity Assessment: Generalized weakness       Communication   Communication: HOH  Cognition Arousal/Alertness: Lethargic Behavior During Therapy: Flat affect Overall Cognitive Status: Impaired/Different from baseline Area of Impairment: Orientation, Attention, Awareness, Following commands, Safety/judgement                 Orientation Level: Disoriented to, Place, Time, Situation     Following Commands: Follows one step commands inconsistently Safety/Judgement: Decreased awareness of safety     General Comments: Pt unable to answer questions regarding history. Limited ability to follow one step commands.        General Comments      Exercises     Assessment/Plan    PT Assessment Patient needs continued PT services  PT Problem List Decreased strength;Decreased activity tolerance;Decreased mobility;Decreased safety awareness;Decreased cognition       PT Treatment Interventions DME  instruction;Gait training;Therapeutic activities;Therapeutic exercise;Patient/family education    PT Goals (Current goals can be found in the Care Plan section)  Acute Rehab PT Goals PT Goal Formulation: Patient unable to participate in goal setting    Frequency Min 2X/week     Co-evaluation               AM-PAC PT "6 Clicks" Mobility  Outcome Measure Help needed turning from your back to your side while in a flat bed without using bedrails?: A Lot Help needed moving from lying on your back to sitting on the side of a flat bed without using bedrails?: A Lot Help needed moving to and from a bed to a chair (including a wheelchair)?: Total Help needed standing up from a chair using your arms (e.g., wheelchair or bedside chair)?: A Lot Help needed to walk in hospital room?: Total Help needed climbing 3-5 steps with a railing? : Total 6 Click Score: 9    End of Session Equipment Utilized During Treatment: Gait belt Activity Tolerance: Patient limited by lethargy Patient left: in bed;with call bell/phone within reach;with bed alarm set Nurse Communication: Mobility status PT Visit Diagnosis: Unsteadiness on feet (R26.81);Muscle weakness (generalized) (M62.81)    Time: 1610-9604 PT Time Calculation (min) (ACUTE ONLY): 22 min   Charges:  PT Evaluation $PT Eval Low Complexity: 1 Low PT Treatments $Therapeutic Activity: 8-22 mins        Timothy Crutchfield, SPT 11/05/22, 4:16 PM   This entire session was performed under direct supervision and direction of a licensed therapist/therapist assistant. I have personally read, edited and approve of the note as written.  Loran Senters, DPT

## 2022-11-05 NOTE — Progress Notes (Signed)
Secure chat with Dr Renae Gloss regarding PIV access.  States ok to leave PIV out.

## 2022-11-05 NOTE — Progress Notes (Signed)
Progress Note   Patient: Todd Brown ZOX:[id number] DOB: 09-10-1960 DOA: 11/03/2022     2 DOS: the patient was seen and examined on 11/05/2022   Brief hospital course: 62 y.o. male with medical history significant of ESRD-HD (MWF), schizophrenia, hypertension, hyperlipidemia, COPD on 2 L oxygen, CAD, hypothyroidism, GERD, remote right leg DVT 2018 not on anticoagulants currently, cardiac arrest, recent admission due to Strep bacteremia with negative TEE, who presents with shortness of breath and altered mental status.   Patient has AMS, is unable to provide any medical history, therefore, most of the history is obtained by discussing the case with ED physician, per EMS report, and with the nursing staff. I also called his legal guardian, Ms. Candice Gobble by phone. Per Ms Lenn Sink, at normal baseline, patient is able to engage in conversation, knows his own name, knows the place he is living, not sure if patient is always orientated to time.  Today patient is very confused, not oriented x 3 currently.  He moves all extremities.  Per report, patient has shortness of breath with severe respiratory distress, with oxygen desaturated to 55% on room air, initially required nonrebreather.  His respiratory status has  improved in ED, currently on 3 L oxygen with 96% of saturation.  No active cough noted.  No active nausea vomiting, diarrhea noted.  On my examination, patient has significant abdominal distention, not sure if patient has abdominal pain or chest pain.   Of note, patient was recently hospitalized from 4/15 - 4/25 due to strep gallolyticus bacteremia. TEE was negative.  Patient finished a course of cefazolin. 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  Per her legal guardian, patient refused colonoscopy.  6/5.  After dialysis patient's weight is down to 153.66 pounds.  Patient is a poor historian. 6/6.  Patient's mental status much  improved today.  TSH elevated at 60 will increase levothyroxine.  Blood pressure low this afternoon will get rid of minoxidil and cut back on hydralazine dosing.  Assessment and Plan: * Acute on chronic respiratory failure with hypoxia (HCC) Suspect all secondary to pulmonary edema.  Initially required a nonrebreather secondary to respiratory distress.  Now back to baseline 2 L.  Acute pulmonary edema (HCC) Acute diastolic congestive heart failure.  Patient had 2 dialysis sessions.  Weight is now down to 153.66 pounds.  Aspiration pneumonia (HCC) Changed to Augmentin  COPD (chronic obstructive pulmonary disease) (HCC) No need for steroids.  Acute metabolic encephalopathy Mental status improved today.  Bacteremia On previous hospitalization on 4/15 had Streptococcus gallolyticus in blood cultures.  Repeat blood cultures on 09/17/22 were negative for 5 days.  Blood cultures so far negative this hospitalization.  ESRD on hemodialysis [facility]) Patient had hemodialysis on admission and yesterday.  Due for hemodialysis tomorrow.  Myocardial injury Only borderline troponins secondary to demand ischemia with heart failure.  Hypothyroidism TSH very elevated at 60.  Increase levothyroxine to 100 mcg daily and will likely have to increase to 125 prior to discharge.  Thrombocytopenia (HCC) Platelet count in normal range 173  Schizophrenia (HCC) Patient on artene and Prolixin.  Pericardial effusion Cardiology does not recommend intervention.  Moderate pericardial effusion without tamponade seen on echocardiogram.  HTN (hypertension) Blood pressure on the lower side overnight and again this afternoon.  Discontinue minoxidil and decrease hydralazine dose.        Subjective: Patient's mental status better today.  He was eating breakfast when I saw him.  Patient  felt okay.  Physical Exam: Vitals:   11/05/22 0339 11/05/22 0350 11/05/22 0840 11/05/22 1537  BP: 104/65  137/78 (!) 85/60   Pulse: (!) 55 69 65 62  Resp: 18  18 18   Temp: 98.4 F (36.9 C)  98.6 F (37 C) 98.2 F (36.8 C)  TempSrc:   Oral   SpO2: 94% 93% 95% 97%  Weight:      Height:       Physical Exam HENT:     Head: Normocephalic.     Mouth/Throat:     Pharynx: No oropharyngeal exudate.  Eyes:     General: Lids are normal.     Conjunctiva/sclera: Conjunctivae normal.  Cardiovascular:     Rate and Rhythm: Normal rate and regular rhythm.     Heart sounds: Normal heart sounds, S1 normal and S2 normal.  Pulmonary:     Breath sounds: Examination of the right-lower field reveals decreased breath sounds. Examination of the left-lower field reveals decreased breath sounds. Decreased breath sounds present. No wheezing, rhonchi or rales.  Abdominal:     Palpations: Abdomen is soft.     Tenderness: There is no abdominal tenderness.  Musculoskeletal:     Right lower leg: No swelling.     Left lower leg: No swelling.  Skin:    General: Skin is warm.     Findings: No rash.  Neurological:     Mental Status: He is alert.     Data Reviewed: Creatinine 4.89, TSH 60, hemoglobin A1c 5.9, hemoglobin 12.9  Family Communication: Spoke with guardian  Disposition: Status is: Inpatient Remains inpatient appropriate because: Since patient is from assisted living will get physical therapy to see him.  Blood pressure variable.  Cut back on hydralazine dose and DC minoxidil. Planned Discharge Destination: Assisted living    Time spent: 28 minutes  Author: Alford Highland, MD 11/05/2022 4:26 PM  For on call review [email or web address].

## 2022-11-05 NOTE — Assessment & Plan Note (Signed)
Cardiology does not recommend intervention.  Moderate pericardial effusion without tamponade seen on echocardiogram.

## 2022-11-05 NOTE — Assessment & Plan Note (Addendum)
Blood pressure on the lower side.  Holding all antihypertensive medications.

## 2022-11-05 NOTE — TOC Initial Note (Signed)
Transition of Care [facility]) - Initial/Assessment Note    Patient Details  Name: Todd Brown MRN: [id number] Date of Birth: June 08, 1960  Transition of Care [facility]) CM/SW Contact:    Chapman Fitch, RN Phone Number: 11/05/2022, 4:38 PM  Clinical Narrative:                  Admitted for: respiratory failure Admitted from:[facility] PCP: [facility] Current home health/prior home health/DME: [facility]. Elnita Maxwell with [facility] to check to see if patient is still avtive    VM left for patients legal guardian Lenn Sink to update Spoke with [facility] owner.  Patient walks short distances with a walker, and primarily uses a WC.  Patient on chronic 2L.  Per Doristine Mango due to o2 patient will need EMS transport at discharge    Expected Discharge Plan: Assisted Living     Patient Goals and CMS Choice            Expected Discharge Plan and Services                                              Prior Living Arrangements/Services                       Activities of Daily Living      Permission Sought/Granted                  Emotional Assessment              Admission diagnosis:  Acute on chronic respiratory failure with hypoxia (HCC) [J96.21] Patient Active Problem List   Diagnosis Date Noted   Myocardial injury 11/03/2022   CAD (coronary artery disease) 11/03/2022   Hypothyroidism 11/03/2022   Thrombocytopenia (HCC) 11/03/2022   Aspiration pneumonia (HCC) 11/03/2022   Abdominal distension 11/03/2022   HTN (hypertension) 11/03/2022   Pericardial effusion 11/03/2022   CAP (community acquired pneumonia) 09/14/2022   Anasarca (mild pulmonary edema, pleural and pericardial effusion, ascites on CT) 09/14/2022   Acute on chronic respiratory failure (HCC) 09/14/2022   Bacteremia 06/20/2022   Acute encephalopathy 06/19/2022   SIRS (systemic inflammatory response syndrome) (HCC) 06/19/2022   NSTEMI (non-ST elevated myocardial infarction)  (HCC) 06/19/2022   Acute on chronic respiratory failure with hypoxia (HCC) 05/21/2022   Hypoxia 05/20/2022   COVID-19 virus infection 05/20/2022   Hyponatremia 02/10/2022   Anemia due to chronic kidney disease 01/21/2022   Hyperkalemia 01/14/2022   Malnutrition of moderate degree 12/24/2021   Oral thrush 12/22/2021   Physical deconditioning 12/20/2021   Acute metabolic encephalopathy 12/15/2021   Essential hypertension 12/15/2021   ESRD on hemodialysis (HCC) 12/15/2021   Altered mental status 10/08/2021   Hypertensive urgency 10/07/2021   COPD (chronic obstructive pulmonary disease) (HCC) 02/26/2021   GERD (gastroesophageal reflux disease) 02/26/2021   Hypertension 11/28/2020   Acute respiratory failure with hypoxia (HCC) 10/23/2019   Anemia of chronic disease 10/23/2019   Fluid overload 10/08/2018   Acute pulmonary edema (HCC) 09/12/2018   Scrotal edema 05/10/2017   Symptomatic anemia 04/30/2017   Anticoagulated on Coumadin 09/14/2016   Cardiac arrest (HCC) 08/03/2016   Severe sepsis versus SIRS 05/26/2016   Dialysis patient (HCC) 04/15/2016   End stage renal disease (HCC) 02/12/2016   Hyperkalemia 05/20/2015   Hepatitis C 09/26/2013   Schizophrenia (HCC) 03/23/2013   Essential  hypertension, benign 03/23/2013   Chronic kidney disease 03/23/2013   PCP:  Smiley Houseman, NP Pharmacy:  No Pharmacies Listed    Social Determinants of Health (SDOH) Social History: SDOH Screenings   Food Insecurity: No Food Insecurity (09/16/2022)  Housing: Low Risk  (09/16/2022)  Transportation Needs: No Transportation Needs (09/16/2022)  Utilities: Not At Risk (09/16/2022)  Alcohol Screen: Low Risk  (10/05/2019)  Financial Resource Strain: Low Risk  (10/08/2018)  Physical Activity: Unknown (10/08/2018)  Social Connections: Unknown (10/08/2018)  Stress: No Stress Concern Present (10/08/2018)  Tobacco Use: Low Risk  (11/03/2022)   SDOH Interventions:     Readmission Risk Interventions     09/17/2022   12:00 PM  Readmission Risk Prevention Plan  Transportation Screening Complete  Medication Review (RN Care Manager) Complete  HRI or Home Care Consult Complete  Skilled Nursing Facility Not Applicable

## 2022-11-05 NOTE — Progress Notes (Signed)
[facility]  ROUNDING NOTE   Subjective:   [name] was admitted to [facility] on 11/03/2022 for Acute on chronic respiratory failure with hypoxia (HCC) [J96.21]  Patient sitting up in bed Alert and oriented Room air Tolerating meals No lower extremity edema   Objective:  Vital signs in last 24 hours:  Temp:  [97.6 F (36.4 C)-98.6 F (37 C)] 98.6 F (37 C) (06/06 0840) Pulse Rate:  [55-69] 65 (06/06 0840) Resp:  [16-18] 18 (06/06 0840) BP: (104-137)/(65-103) 137/78 (06/06 0840) SpO2:  [93 %-100 %] 95 % (06/06 0840)  Weight change: -2.7 kg Filed Weights   11/03/22 2034 11/04/22 0829 11/04/22 1226  Weight: 75 kg 72.7 kg 69.7 kg    Intake/Output: I/O last 3 completed shifts: In: 460 [P.O.:360; IV Piggyback:100] Out: 3000 [Other:3000]   Intake/Output this shift:  No intake/output data recorded.  Physical Exam: General: NAD  Head: Normocephalic, atraumatic. Moist oral mucosal membranes  Eyes: Anicteric  Lungs:  Diminished in bases  Heart: Regular rate and rhythm  Abdomen:  Soft, nontender  Extremities:  trace peripheral edema.  Neurologic: Nonfocal, moving all four extremities  Skin: No lesions  Access: Left AVF    Basic Metabolic Panel: Recent Labs  Lab 11/03/22 0936 11/04/22 0814  NA 139 138  K 4.3 4.0  CL 95* 96*  CO2 28 31  GLUCOSE 88 71  BUN 27* 19  CREATININE 5.66* 4.89*  CALCIUM 9.4 9.0     Liver Function Tests: Recent Labs  Lab 11/03/22 0936  AST 31  ALT 6  ALKPHOS 63  BILITOT 0.8  PROT 7.3  ALBUMIN 3.6    No results for input(s): "LIPASE", "AMYLASE" in the last 168 hours. No results for input(s): "AMMONIA" in the last 168 hours.  CBC: Recent Labs  Lab 11/03/22 0928 11/04/22 0814  WBC 4.9 4.4  NEUTROABS 3.7  --   HGB 12.5* 12.9*  HCT 39.3 39.6  MCV 105.9* 101.5*  PLT 136* 173     Cardiac Enzymes: No results for input(s): "CKTOTAL", "CKMB", "CKMBINDEX", "TROPONINI" in the last 168  hours.  BNP: Invalid input(s): "POCBNP"  CBG: Recent Labs  Lab 11/03/22 2020  GLUCAP 77     Microbiology: Results for orders placed or performed during the hospital encounter of 11/03/22  SARS Coronavirus 2 by RT PCR (hospital order, performed in [facility] hospital lab) *cepheid single result test* Anterior Nasal Swab     Status: None   Collection Time: 11/03/22  9:36 AM   Specimen: Anterior Nasal Swab  Result Value Ref Range Status   SARS Coronavirus 2 by RT PCR NEGATIVE NEGATIVE Final    Comment: (NOTE) SARS-CoV-2 target nucleic acids are NOT DETECTED.  The SARS-CoV-2 RNA is generally detectable in upper and lower respiratory specimens during the acute phase of infection. The lowest concentration of SARS-CoV-2 viral copies this assay can detect is 250 copies / mL. A negative result does not preclude SARS-CoV-2 infection and should not be used as the sole basis for treatment or other patient management decisions.  A negative result may occur with improper specimen collection / handling, submission of specimen other than nasopharyngeal swab, presence of viral mutation(s) within the areas targeted by this assay, and inadequate number of viral copies (<250 copies / mL). A negative result must be combined with clinical observations, patient history, and epidemiological information.  Fact Sheet for Patients:   RoadLapTop.co.za  Fact Sheet for Healthcare Providers: http://kim-miller.com/  This test is not yet  approved or  cleared by the [place] and has been authorized for detection and/or diagnosis of SARS-CoV-2 by FDA under an Emergency Use Authorization (EUA).  This EUA will remain in effect (meaning this test can be used) for the duration of the COVID-19 declaration under Section 564(b)(1) of the Act, 21 U.S.C. section 360bbb-3(b)(1), unless the authorization is terminated or revoked sooner.  Performed at [facility], [address] [place], [place] [address]   Blood culture (routine x 2)     Status: None (Preliminary result)   Collection Time: 11/03/22 11:45 AM   Specimen: BLOOD  Result Value Ref Range Status   Specimen Description BLOOD RIGHT HAND  Final   Special Requests   Final    BOTTLES DRAWN AEROBIC AND ANAEROBIC Blood Culture adequate volume   Culture   Final    NO GROWTH 2 DAYS Performed at [facility], [address] [place], [place] [address]    Report Status PENDING  Incomplete  Blood culture (routine x 2)     Status: None (Preliminary result)   Collection Time: 11/03/22 11:47 AM   Specimen: BLOOD  Result Value Ref Range Status   Specimen Description BLOOD RIGHT ARM  Final   Special Requests   Final    BOTTLES DRAWN AEROBIC AND ANAEROBIC Blood Culture results may not be optimal due to an inadequate volume of blood received in culture bottles   Culture   Final    NO GROWTH 2 DAYS Performed at [facility], [address], [place], [place] [address]    Report Status PENDING  Incomplete  MRSA Next Gen by PCR, Nasal     Status: None   Collection Time: 11/03/22  9:23 PM   Specimen: Nasal Mucosa; Nasal Swab  Result Value Ref Range Status   MRSA by PCR Next Gen NOT DETECTED NOT DETECTED Final    Comment: (NOTE) The GeneXpert MRSA Assay (FDA approved for NASAL specimens only), is one component of a comprehensive MRSA colonization surveillance program. It is not intended to diagnose MRSA infection nor to guide or monitor treatment for MRSA infections. Test performance is not FDA approved in patients less than 35 years old. Performed at [facility], [address] [place], [place] [address]   Expectorated Sputum Assessment w Gram Stain, Rflx to Resp Cult     Status: None   Collection Time: 11/03/22  9:39 PM   Specimen: Sputum  Result Value Ref Range Status   Specimen Description SPUTUM  Final   Special Requests NONE  Final   Sputum  evaluation   Final    Sputum specimen not acceptable for testing.  Please recollect.   C/ JENA [facility] RN @2355  11/03/22 ASW Performed at [facility], [address] [place], [place] [address]    Report Status 11/03/2022 FINAL  Final    Coagulation Studies: Recent Labs    11/03/22 1300  LABPROT 13.9  INR 1.1     Urinalysis: No results for input(s): "COLORURINE", "LABSPEC", "PHURINE", "GLUCOSEU", "HGBUR", "BILIRUBINUR", "KETONESUR", "PROTEINUR", "UROBILINOGEN", "NITRITE", "LEUKOCYTESUR" in the last 72 hours.  Invalid input(s): "APPERANCEUR"    Imaging: ECHOCARDIOGRAM LIMITED  Result Date: 11/04/2022    ECHOCARDIOGRAM LIMITED REPORT   Patient Name:   TAHJAY LEEMAN Date of Exam: 11/04/2022 Medical Rec #:  [id number]        Height:       71.0 in Accession #:    [id number]  Weight:       153.7 lb Date of Birth:  1960-11-18        BSA:          1.885 m Patient Age:    62 years         BP:           127/76 mmHg Patient Gender: M                HR:           65 bpm. Exam Location:  [facility] Procedure: Limited Echo, Color Doppler and Cardiac Doppler Indications:     Pericardial Effusion I31.3  History:         Patient has prior history of Echocardiogram examinations, most                  recent 09/17/2022. COPD; Risk Factors:Hypertension. Dialysis                  patient.  Sonographer:     Cristela Blue Referring Phys:  [id number] [facility] MICHELLE TANG Diagnosing Phys: Marcina Millard MD IMPRESSIONS  1. Left ventricular ejection fraction, by estimation, is 65 to 70%. The left ventricle has normal function. There is moderate left ventricular hypertrophy.  2. Moderate pericardial effusion. The pericardial effusion is lateral to the left ventricle. There is no evidence of cardiac tamponade. Moderate pleural effusion.  3. Mild mitral valve regurgitation.  4. Tricuspid valve regurgitation is mild to moderate. FINDINGS  Left Ventricle: Left ventricular ejection fraction, by estimation, is 65 to 70%.  The left ventricle has normal function. There is moderate left ventricular hypertrophy. Pericardium: A moderately sized pericardial effusion is present. The pericardial effusion is lateral to the left ventricle. There is no evidence of cardiac tamponade. Mitral Valve: Mild mitral valve regurgitation. Tricuspid Valve: Tricuspid valve regurgitation is mild to moderate. Additional Comments: There is a moderate pleural effusion. Spectral Doppler performed. Color Doppler performed.  LEFT VENTRICLE PLAX 2D LVIDd:         4.00 cm LVIDs:         2.10 cm LV PW:         2.30 cm LV IVS:        2.10 cm LVOT diam:     2.30 cm LVOT Area:     4.15 cm  RIGHT VENTRICLE RV Basal diam:  4.60 cm RV Mid diam:    3.70 cm LEFT ATRIUM         Index LA diam:    3.10 cm 1.64 cm/m                        PULMONIC VALVE AORTA                 PR End Diast Vel: 12.25 msec Ao Root diam: 3.30 cm  MITRAL VALVE MV Area (PHT): 1.61 cm    SHUNTS MV Decel Time: 472 msec    Systemic Diam: 2.30 cm MV E velocity: 58.70 cm/s MV A velocity: 93.30 cm/s MV E/A ratio:  0.63 Marcina Millard MD Electronically signed by Marcina Millard MD Signature Date/Time: 11/04/2022/4:24:00 PM    Final    CT ABDOMEN PELVIS WO CONTRAST  Result Date: 11/03/2022 CLINICAL DATA:  Bowel obstruction suspected, agitation EXAM: CT CHEST, ABDOMEN AND PELVIS WITHOUT CONTRAST TECHNIQUE: Multidetector CT imaging of the chest, abdomen and pelvis was performed following the standard protocol without IV contrast. RADIATION DOSE REDUCTION: This exam was performed  according to the departmental dose-optimization program which includes automated exposure control, adjustment of the mA and/or kV according to patient size and/or use of iterative reconstruction technique. COMPARISON:  09/14/2022 FINDINGS: CT CHEST FINDINGS Cardiovascular: Mild cardiomegaly. Moderately large pericardial effusion, increased since previous. Scattered coronary and aortic calcifications. Mediastinum/Nodes: No  mass or adenopathy. Lungs/Pleura: Small pleural effusions left greater than right as before. No pneumothorax. Patchy atelectasis/consolidation in the lower lobes with air bronchograms, left worse than right, slightly increased from previous. Portal edema and some and atelectasis in the upper lobes, new since previous. Musculoskeletal: Healing manubrial fracture.  No acute findings. CT ABDOMEN PELVIS FINDINGS Hepatobiliary: Layering hyperdensities in the dependent aspect of the nondilated gallbladder. No focal liver lesion or biliary ductal dilatation. Pancreas: No mass or ductal dilatation. Increase in surrounding retroperitoneal edema/fluid. Spleen: Normal in size without focal abnormality. Adrenals/Urinary Tract: Bilateral adrenal hypertrophy. Bilateral renal parenchymal atrophy with extensive cystic change. No urolithiasis or hydronephrosis. Urinary bladder nondistended. Stomach/Bowel: Stomach is incompletely distended, unremarkable. There is circumferential wall thickening in multiple loops of small bowel in the left mid abdomen. No dilated loops are evident however. Normal appendix. The colon is incompletely distended by gas and fecal material, without acute finding. Vascular/Lymphatic: Extensive aortoiliac calcified atheromatous plaque without aneurysm. No abdominal or pelvic adenopathy. Reproductive: Prostate is unremarkable. Other: Bilateral pelvic phleboliths. Moderate abdominal ascites, increased since previous. No free air. Musculoskeletal: No acute or significant osseous findings. IMPRESSION: 1. Moderately large pericardial effusion, increased since previous. 2. Small bilateral pleural effusions, increased since previous. 3. Worsening atelectasis/consolidation in the lower lobes, left worse than right. 4. Moderate abdominal ascites, increased since previous. 5. Circumferential wall thickening in multiple loops of small bowel in the left mid abdomen, possibly enteritis. No evidence of obstruction. 6.   Aortic Atherosclerosis (ICD10-I70.0). Electronically Signed   By: Corlis Leak M.D.   On: 11/03/2022 14:15   CT CHEST WO CONTRAST  Result Date: 11/03/2022 CLINICAL DATA:  Bowel obstruction suspected, agitation EXAM: CT CHEST, ABDOMEN AND PELVIS WITHOUT CONTRAST TECHNIQUE: Multidetector CT imaging of the chest, abdomen and pelvis was performed following the standard protocol without IV contrast. RADIATION DOSE REDUCTION: This exam was performed according to the departmental dose-optimization program which includes automated exposure control, adjustment of the mA and/or kV according to patient size and/or use of iterative reconstruction technique. COMPARISON:  09/14/2022 FINDINGS: CT CHEST FINDINGS Cardiovascular: Mild cardiomegaly. Moderately large pericardial effusion, increased since previous. Scattered coronary and aortic calcifications. Mediastinum/Nodes: No mass or adenopathy. Lungs/Pleura: Small pleural effusions left greater than right as before. No pneumothorax. Patchy atelectasis/consolidation in the lower lobes with air bronchograms, left worse than right, slightly increased from previous. Portal edema and some and atelectasis in the upper lobes, new since previous. Musculoskeletal: Healing manubrial fracture.  No acute findings. CT ABDOMEN PELVIS FINDINGS Hepatobiliary: Layering hyperdensities in the dependent aspect of the nondilated gallbladder. No focal liver lesion or biliary ductal dilatation. Pancreas: No mass or ductal dilatation. Increase in surrounding retroperitoneal edema/fluid. Spleen: Normal in size without focal abnormality. Adrenals/Urinary Tract: Bilateral adrenal hypertrophy. Bilateral renal parenchymal atrophy with extensive cystic change. No urolithiasis or hydronephrosis. Urinary bladder nondistended. Stomach/Bowel: Stomach is incompletely distended, unremarkable. There is circumferential wall thickening in multiple loops of small bowel in the left mid abdomen. No dilated loops are  evident however. Normal appendix. The colon is incompletely distended by gas and fecal material, without acute finding. Vascular/Lymphatic: Extensive aortoiliac calcified atheromatous plaque without aneurysm. No abdominal or pelvic adenopathy. Reproductive: Prostate is unremarkable. Other:  Bilateral pelvic phleboliths. Moderate abdominal ascites, increased since previous. No free air. Musculoskeletal: No acute or significant osseous findings. IMPRESSION: 1. Moderately large pericardial effusion, increased since previous. 2. Small bilateral pleural effusions, increased since previous. 3. Worsening atelectasis/consolidation in the lower lobes, left worse than right. 4. Moderate abdominal ascites, increased since previous. 5. Circumferential wall thickening in multiple loops of small bowel in the left mid abdomen, possibly enteritis. No evidence of obstruction. 6.  Aortic Atherosclerosis (ICD10-I70.0). Electronically Signed   By: Corlis Leak M.D.   On: 11/03/2022 14:15     Medications:      amLODipine  10 mg Oral q morning   amoxicillin-clavulanate  1 tablet Oral BID   ascorbic acid  500 mg Oral Daily   aspirin EC  81 mg Oral Daily   calcium acetate  1,334 mg Oral TID WC   carbamazepine  1,000 mg Oral q morning   Chlorhexidine Gluconate Cloth  6 each Topical Q0600   cholecalciferol  1,000 Units Oral q AM   doxazosin  4 mg Oral QHS   ferrous sulfate  325 mg Oral Daily   fluPHENAZine  2.5 mg Oral Daily   heparin  5,000 Units Subcutaneous Q8H   hydrALAZINE  50 mg Oral TID   isosorbide mononitrate  60 mg Oral BID   levothyroxine  75 mcg Oral q AM   minoxidil  2.5 mg Oral Daily   mometasone-formoterol  2 puff Inhalation BID   multivitamin  1 tablet Oral QHS   pantoprazole  40 mg Oral Daily   trihexyphenidyl  2 mg Oral BID WC   zinc sulfate  220 mg Oral Daily   acetaminophen, acetaminophen, albuterol, dextromethorphan-guaiFENesin, haloperidol lactate, hydrALAZINE, naproxen, ondansetron (ZOFRAN)  IV, mouth rinse, QUEtiapine  Assessment/ Plan:  Mr. KARMA STREITZ is a 62 y.o.  male with end stage renal disease on hemodialysis, hypertension, schizophrenia, DVT, COPD, who is admitted to [facility] on 11/03/2022 for Acute on chronic respiratory failure with hypoxia (HCC) [J96.21]  [facility] MWF [place] Left AVF 79.5kg  End Stage Renal Disease: with volume overload. Have achieved 6L fluid removal over he past 2 days. Next treatment scheduled for Friday.   Hypertension with chronic kidney disease: home regimen of amlodipine, clonidine, doxazosin, hydralazine, minoxidil,.  Blood pressure stable for this patient  Anemia with chronic kidney disease: hemoglobin 12.9. Mircera as outpatient.   Secondary Hyperparathyroidism:  - Calcium acetate with meals   LOS: 2   6/6/20241:20 PM

## 2022-11-06 DIAGNOSIS — J449 Chronic obstructive pulmonary disease, unspecified: Secondary | ICD-10-CM | POA: Diagnosis not present

## 2022-11-06 DIAGNOSIS — G9341 Metabolic encephalopathy: Secondary | ICD-10-CM | POA: Diagnosis not present

## 2022-11-06 DIAGNOSIS — J9621 Acute and chronic respiratory failure with hypoxia: Secondary | ICD-10-CM | POA: Diagnosis not present

## 2022-11-06 DIAGNOSIS — J690 Pneumonitis due to inhalation of food and vomit: Secondary | ICD-10-CM | POA: Diagnosis not present

## 2022-11-06 DIAGNOSIS — F2089 Other schizophrenia: Secondary | ICD-10-CM

## 2022-11-06 LAB — CBC
HCT: 37.9 % — ABNORMAL LOW (ref 39.0–52.0)
Hemoglobin: 12.5 g/dL — ABNORMAL LOW (ref 13.0–17.0)
MCH: 33.4 pg (ref 26.0–34.0)
MCHC: 33 g/dL (ref 30.0–36.0)
MCV: 101.3 fL — ABNORMAL HIGH (ref 80.0–100.0)
Platelets: 180 10*3/uL (ref 150–400)
RBC: 3.74 MIL/uL — ABNORMAL LOW (ref 4.22–5.81)
RDW: 14.8 % (ref 11.5–15.5)
WBC: 4.6 10*3/uL (ref 4.0–10.5)
nRBC: 0 % (ref 0.0–0.2)

## 2022-11-06 LAB — RENAL FUNCTION PANEL
Albumin: 3.3 g/dL — ABNORMAL LOW (ref 3.5–5.0)
Anion gap: 10 (ref 5–15)
BUN: 33 mg/dL — ABNORMAL HIGH (ref 8–23)
CO2: 30 mmol/L (ref 22–32)
Calcium: 9.2 mg/dL (ref 8.9–10.3)
Chloride: 96 mmol/L — ABNORMAL LOW (ref 98–111)
Creatinine, Ser: 6.12 mg/dL — ABNORMAL HIGH (ref 0.61–1.24)
GFR, Estimated: 10 mL/min — ABNORMAL LOW (ref >60)
Glucose, Bld: 143 mg/dL — ABNORMAL HIGH (ref 70–99)
Phosphorus: 3.2 mg/dL (ref 2.5–4.6)
Potassium: 4.2 mmol/L (ref 3.5–5.1)
Sodium: 136 mmol/L (ref 135–145)

## 2022-11-06 LAB — CULTURE, BLOOD (ROUTINE X 2)

## 2022-11-06 MED ORDER — AMOXICILLIN-POT CLAVULANATE 500-125 MG PO TABS: 1.0000 | ORAL_TABLET | Freq: Every evening | ORAL | 0 refills | Status: DC

## 2022-11-06 NOTE — Progress Notes (Signed)
Progress Note   Patient: Todd Brown ZOX:[id number] DOB: January 26, 1961 DOA: 11/03/2022     3 DOS: the patient was seen and examined on 11/06/2022   Brief hospital course: 62 y.o. male with medical history significant of ESRD-HD (MWF), schizophrenia, hypertension, hyperlipidemia, COPD on 2 L oxygen, CAD, hypothyroidism, GERD, remote right leg DVT 2018 not on anticoagulants currently, cardiac arrest, recent admission due to Strep bacteremia with negative TEE, who presents with shortness of breath and altered mental status.   Patient has AMS, is unable to provide any medical history, therefore, most of the history is obtained by discussing the case with ED physician, per EMS report, and with the nursing staff. I also called his legal guardian, Ms. Todd Brown by phone. Per Ms Lenn Sink, at normal baseline, patient is able to engage in conversation, knows his own name, knows the place he is living, not sure if patient is always orientated to time.  Today patient is very confused, not oriented x 3 currently.  He moves all extremities.  Per report, patient has shortness of breath with severe respiratory distress, with oxygen desaturated to 55% on room air, initially required nonrebreather.  His respiratory status has  improved in ED, currently on 3 L oxygen with 96% of saturation.  No active cough noted.  No active nausea vomiting, diarrhea noted.  On my examination, patient has significant abdominal distention, not sure if patient has abdominal pain or chest pain.   Of note, patient was recently hospitalized from 4/15 - 4/25 due to strep gallolyticus bacteremia. TEE was negative.  Patient finished a course of cefazolin. 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  Per her legal guardian, patient refused colonoscopy.  6/5.  After dialysis patient's weight is down to 153.66 pounds.  Patient is a poor historian. 6/6.  Patient's mental status much  improved today.  TSH elevated at 60 will increase levothyroxine.  Blood pressure low this afternoon will get rid of minoxidil and cut back on hydralazine dosing. 6/7.  Holding all blood pressure medications.    Assessment and Plan: * Acute on chronic respiratory failure with hypoxia (HCC) Suspect all secondary to pulmonary edema.  Initially required a nonrebreather secondary to respiratory distress.  Now back to baseline 2 L.  Acute pulmonary edema (HCC) Acute diastolic congestive heart failure.  Patient had 2 dialysis sessions.  Weight predialysis today up at 159.61 pounds.  Aspiration pneumonia (HCC) Continue Augmentin  COPD (chronic obstructive pulmonary disease) (HCC) No need for steroids.  Acute metabolic encephalopathy Patient answers some simple questions..  Bacteremia On previous hospitalization on 4/15 had Streptococcus gallolyticus in blood cultures.  Repeat blood cultures on 09/17/22 were negative for 5 days.  Blood cultures so far negative this hospitalization.  ESRD on hemodialysis [facility]) Patient had hemodialysis on admission, 2 days ago and having dialysis now.  Myocardial injury Only borderline troponins secondary to demand ischemia with heart failure.  Hypothyroidism TSH very elevated at 60.  Increased levothyroxine to 100 mcg daily and will likely have to increase to 125 prior to discharge.  Thrombocytopenia (HCC) Resolved.  Platelet count in normal range 180.  Schizophrenia (HCC) Patient on artene and Prolixin.  Pericardial effusion Cardiology does not recommend intervention.  Moderate pericardial effusion without tamponade seen on echocardiogram.  HTN (hypertension) Blood pressure on the lower side.  Holding all antihypertensive medications.        Subjective: Patient feels okay.  Answers a few questions.  Feels his breathing  is okay.  Seen earlier this morning and again this afternoon on dialysis.  Admitted with acute on chronic respiratory failure.   Holding blood pressure medications currently.  Physical Exam: Vitals:   11/06/22 1400 11/06/22 1430 11/06/22 1500 11/06/22 1530  BP: 105/80 106/71 104/72 96/76  Pulse: 62 62 (!) 58 60  Resp: 13 13 17 12   Temp:      TempSrc:      SpO2: 99% 99% 100% 96%  Weight:      Height:       Physical Exam HENT:     Head: Normocephalic.     Mouth/Throat:     Pharynx: No oropharyngeal exudate.  Eyes:     General: Lids are normal.     Conjunctiva/sclera: Conjunctivae normal.  Cardiovascular:     Rate and Rhythm: Normal rate and regular rhythm.     Heart sounds: Normal heart sounds, S1 normal and S2 normal.  Pulmonary:     Breath sounds: Examination of the right-lower field reveals decreased breath sounds. Examination of the left-lower field reveals decreased breath sounds. Decreased breath sounds present. No wheezing, rhonchi or rales.  Abdominal:     Palpations: Abdomen is soft.     Tenderness: There is no abdominal tenderness.  Musculoskeletal:     Right lower leg: No swelling.     Left lower leg: No swelling.  Skin:    General: Skin is warm.     Findings: No rash.  Neurological:     Mental Status: He is alert.     Data Reviewed: Creatinine 6.12, hemoglobin 12.5  Family Communication: Left message for guardian  Disposition: Status is: Inpatient Remains inpatient appropriate because: With blood pressures being low over the last couple days 1 to make sure what blood pressure medications he will need or not need upon going home.  Patient on late dialysis session today will monitor overnight and send back to assisted living tomorrow  Planned Discharge Destination: Assisted living    Time spent: 32 minutes Case discussed with nephrology  Author: Alford Highland, MD 11/06/2022 4:46 PM  For on call review [email or web address].

## 2022-11-06 NOTE — Progress Notes (Signed)
Mobility Specialist - Progress Note   11/06/22 1156  Mobility  Activity Ambulated with assistance in room;Stood at bedside;Dangled on edge of bed  Level of Assistance Contact guard assist, steadying assist  Assistive Device Front wheel walker  Distance Ambulated (ft) 5 ft  Range of Motion/Exercises Active;All extremities  Activity Response Tolerated well  Mobility Referral Yes  $Mobility charge 1 Mobility      Pt lying in bed upon arrival, utilizing 2L. Pt agreeable to activity. Completed bed mobility modI + task initiation, assist only for hand placement onto rail d/t vision impairment. Pt completed x5 STS with minG and RW. No LOB. On final STS pt progressed to side-stepping towards [facility] with directional cues and light assist to keep RW close to body. Pt returned to bed with alarm set, needs in reach. RN notified.   Filiberto Pinks Mobility Specialist 11/06/22, 12:01 PM

## 2022-11-06 NOTE — Progress Notes (Signed)
[facility]  ROUNDING NOTE   Subjective:   [name] was admitted to [facility] on 11/03/2022 for Acute on chronic respiratory failure with hypoxia (HCC) [J96.21]  Patient sitting up in bed Awaiting breakfast  Remains on 2L No lower extremity edema   Objective:  Vital signs in last 24 hours:  Temp:  [97.5 F (36.4 C)-98.3 F (36.8 C)] 97.5 F (36.4 C) (06/07 0803) Pulse Rate:  [61-69] 62 (06/07 0803) Resp:  [18-20] 18 (06/07 0803) BP: (85-145)/(60-83) 145/83 (06/07 0803) SpO2:  [96 %-99 %] 97 % (06/07 0803)  Weight change:  Filed Weights   11/03/22 2034 11/04/22 0829 11/04/22 1226  Weight: 75 kg 72.7 kg 69.7 kg    Intake/Output: I/O last 3 completed shifts: In: 720 [P.O.:720] Out: -    Intake/Output this shift:  Total I/O In: 60 [P.O.:60] Out: -   Physical Exam: General: NAD  Head: Normocephalic, atraumatic. Moist oral mucosal membranes  Eyes: Anicteric  Lungs:  Diminished in bases, [place] O2  Heart: Regular rate and rhythm  Abdomen:  Soft, nontender  Extremities:  trace peripheral edema.  Neurologic: Nonfocal, moving all four extremities  Skin: No lesions  Access: Left AVF    Basic Metabolic Panel: Recent Labs  Lab 11/03/22 0936 11/04/22 0814  NA 139 138  K 4.3 4.0  CL 95* 96*  CO2 28 31  GLUCOSE 88 71  BUN 27* 19  CREATININE 5.66* 4.89*  CALCIUM 9.4 9.0     Liver Function Tests: Recent Labs  Lab 11/03/22 0936  AST 31  ALT 6  ALKPHOS 63  BILITOT 0.8  PROT 7.3  ALBUMIN 3.6    No results for input(s): "LIPASE", "AMYLASE" in the last 168 hours. No results for input(s): "AMMONIA" in the last 168 hours.  CBC: Recent Labs  Lab 11/03/22 0928 11/04/22 0814  WBC 4.9 4.4  NEUTROABS 3.7  --   HGB 12.5* 12.9*  HCT 39.3 39.6  MCV 105.9* 101.5*  PLT 136* 173     Cardiac Enzymes: No results for input(s): "CKTOTAL", "CKMB", "CKMBINDEX", "TROPONINI" in the last 168 hours.  BNP: Invalid input(s):  "POCBNP"  CBG: Recent Labs  Lab 11/03/22 2020  GLUCAP 77     Microbiology: Results for orders placed or performed during the hospital encounter of 11/03/22  SARS Coronavirus 2 by RT PCR (hospital order, performed in [facility] hospital lab) *cepheid single result test* Anterior Nasal Swab     Status: None   Collection Time: 11/03/22  9:36 AM   Specimen: Anterior Nasal Swab  Result Value Ref Range Status   SARS Coronavirus 2 by RT PCR NEGATIVE NEGATIVE Final    Comment: (NOTE) SARS-CoV-2 target nucleic acids are NOT DETECTED.  The SARS-CoV-2 RNA is generally detectable in upper and lower respiratory specimens during the acute phase of infection. The lowest concentration of SARS-CoV-2 viral copies this assay can detect is 250 copies / mL. A negative result does not preclude SARS-CoV-2 infection and should not be used as the sole basis for treatment or other patient management decisions.  A negative result may occur with improper specimen collection / handling, submission of specimen other than nasopharyngeal swab, presence of viral mutation(s) within the areas targeted by this assay, and inadequate number of viral copies (<250 copies / mL). A negative result must be combined with clinical observations, patient history, and epidemiological information.  Fact Sheet for Patients:   RoadLapTop.co.za  Fact Sheet for Healthcare Providers: http://kim-miller.com/  This test is  not yet approved or  cleared by the [place] and has been authorized for detection and/or diagnosis of SARS-CoV-2 by FDA under an Emergency Use Authorization (EUA).  This EUA will remain in effect (meaning this test can be used) for the duration of the COVID-19 declaration under Section 564(b)(1) of the Act, 21 U.S.C. section 360bbb-3(b)(1), unless the authorization is terminated or revoked sooner.  Performed at [facility], [address] [place], [place] [address]   Blood culture (routine x 2)     Status: None (Preliminary result)   Collection Time: 11/03/22 11:45 AM   Specimen: BLOOD  Result Value Ref Range Status   Specimen Description BLOOD RIGHT HAND  Final   Special Requests   Final    BOTTLES DRAWN AEROBIC AND ANAEROBIC Blood Culture adequate volume   Culture   Final    NO GROWTH 3 DAYS Performed at [facility], [address], [place], [place] [address]    Report Status PENDING  Incomplete  Blood culture (routine x 2)     Status: None (Preliminary result)   Collection Time: 11/03/22 11:47 AM   Specimen: BLOOD  Result Value Ref Range Status   Specimen Description BLOOD RIGHT ARM  Final   Special Requests   Final    BOTTLES DRAWN AEROBIC AND ANAEROBIC Blood Culture results may not be optimal due to an inadequate volume of blood received in culture bottles   Culture   Final    NO GROWTH 3 DAYS Performed at [facility], [address], [place], [place] [address]    Report Status PENDING  Incomplete  MRSA Next Gen by PCR, Nasal     Status: None   Collection Time: 11/03/22  9:23 PM   Specimen: Nasal Mucosa; Nasal Swab  Result Value Ref Range Status   MRSA by PCR Next Gen NOT DETECTED NOT DETECTED Final    Comment: (NOTE) The GeneXpert MRSA Assay (FDA approved for NASAL specimens only), is one component of a comprehensive MRSA colonization surveillance program. It is not intended to diagnose MRSA infection nor to guide or monitor treatment for MRSA infections. Test performance is not FDA approved in patients less than 92 years old. Performed at [facility], [address] [place], [place] [address]   Expectorated Sputum Assessment w Gram Stain, Rflx to Resp Cult     Status: None   Collection Time: 11/03/22  9:39 PM   Specimen: Sputum  Result Value Ref Range Status   Specimen Description SPUTUM  Final   Special Requests NONE  Final   Sputum evaluation   Final    Sputum  specimen not acceptable for testing.  Please recollect.   C/ [facility] RN @2355  11/03/22 ASW Performed at [facility], [address] [place], [place] [address]    Report Status 11/03/2022 FINAL  Final    Coagulation Studies: Recent Labs    11/03/22 1300  LABPROT 13.9  INR 1.1     Urinalysis: No results for input(s): "COLORURINE", "LABSPEC", "PHURINE", "GLUCOSEU", "HGBUR", "BILIRUBINUR", "KETONESUR", "PROTEINUR", "UROBILINOGEN", "NITRITE", "LEUKOCYTESUR" in the last 72 hours.  Invalid input(s): "APPERANCEUR"    Imaging: ECHOCARDIOGRAM LIMITED  Result Date: 11/04/2022    ECHOCARDIOGRAM LIMITED REPORT   Patient Name:   Todd Brown Date of Exam: 11/04/2022 Medical Rec #:  [id number]        Height:       71.0 in Accession #:    [id number]  Weight:       153.7 lb Date of Birth:  1960/07/13        BSA:          1.885 m Patient Age:    62 years         BP:           127/76 mmHg Patient Gender: M                HR:           65 bpm. Exam Location:  [facility] Procedure: Limited Echo, Color Doppler and Cardiac Doppler Indications:     Pericardial Effusion I31.3  History:         Patient has prior history of Echocardiogram examinations, most                  recent 09/17/2022. COPD; Risk Factors:Hypertension. Dialysis                  patient.  Sonographer:     Cristela Blue Referring Phys:  [id number] [facility] MICHELLE TANG Diagnosing Phys: Marcina Millard MD IMPRESSIONS  1. Left ventricular ejection fraction, by estimation, is 65 to 70%. The left ventricle has normal function. There is moderate left ventricular hypertrophy.  2. Moderate pericardial effusion. The pericardial effusion is lateral to the left ventricle. There is no evidence of cardiac tamponade. Moderate pleural effusion.  3. Mild mitral valve regurgitation.  4. Tricuspid valve regurgitation is mild to moderate. FINDINGS  Left Ventricle: Left ventricular ejection fraction, by estimation, is 65 to 70%. The left ventricle has normal  function. There is moderate left ventricular hypertrophy. Pericardium: A moderately sized pericardial effusion is present. The pericardial effusion is lateral to the left ventricle. There is no evidence of cardiac tamponade. Mitral Valve: Mild mitral valve regurgitation. Tricuspid Valve: Tricuspid valve regurgitation is mild to moderate. Additional Comments: There is a moderate pleural effusion. Spectral Doppler performed. Color Doppler performed.  LEFT VENTRICLE PLAX 2D LVIDd:         4.00 cm LVIDs:         2.10 cm LV PW:         2.30 cm LV IVS:        2.10 cm LVOT diam:     2.30 cm LVOT Area:     4.15 cm  RIGHT VENTRICLE RV Basal diam:  4.60 cm RV Mid diam:    3.70 cm LEFT ATRIUM         Index LA diam:    3.10 cm 1.64 cm/m                        PULMONIC VALVE AORTA                 PR End Diast Vel: 12.25 msec Ao Root diam: 3.30 cm  MITRAL VALVE MV Area (PHT): 1.61 cm    SHUNTS MV Decel Time: 472 msec    Systemic Diam: 2.30 cm MV E velocity: 58.70 cm/s MV A velocity: 93.30 cm/s MV E/A ratio:  0.63 Marcina Millard MD Electronically signed by Marcina Millard MD Signature Date/Time: 11/04/2022/4:24:00 PM    Final      Medications:      amoxicillin-clavulanate  1 tablet Oral BID   ascorbic acid  500 mg Oral Daily   aspirin EC  81 mg Oral Daily   calcium acetate  1,334 mg Oral TID WC   carbamazepine  1,000 mg Oral q  morning   Chlorhexidine Gluconate Cloth  6 each Topical Q0600   cholecalciferol  1,000 Units Oral q AM   doxazosin  4 mg Oral QHS   ferrous sulfate  325 mg Oral Daily   fluPHENAZine  2.5 mg Oral Daily   heparin  5,000 Units Subcutaneous Q8H   hydrALAZINE  10 mg Oral TID   isosorbide mononitrate  60 mg Oral BID   levothyroxine  100 mcg Oral q AM   mometasone-formoterol  2 puff Inhalation BID   multivitamin  1 tablet Oral QHS   pantoprazole  40 mg Oral Daily   trihexyphenidyl  2 mg Oral BID WC   zinc sulfate  220 mg Oral Daily   acetaminophen, acetaminophen, albuterol,  dextromethorphan-guaiFENesin, haloperidol lactate, hydrALAZINE, naproxen, ondansetron (ZOFRAN) IV, mouth rinse, QUEtiapine  Assessment/ Plan:  [name] is a 62 y.o.  male with end stage renal disease on hemodialysis, hypertension, schizophrenia, DVT, COPD, who is admitted to [facility] on 11/03/2022 for Acute on chronic respiratory failure with hypoxia (HCC) [J96.21]  [facility] Left AVF 79.5kg  End Stage Renal Disease: with volume overload. Receiving dialysis later today per outpatient schedule   Hypertension with chronic kidney disease: home regimen of amlodipine, clonidine, doxazosin, hydralazine, minoxidil,.  Blood pressure acceptable for this patient  Anemia with chronic kidney disease: hemoglobin 12.9. Mircera as outpatient.   Secondary Hyperparathyroidism:  - Bone minerals are within desired range.  - Calcium acetate with meals   LOS: 3   6/7/202410:33 AM

## 2022-11-06 NOTE — Progress Notes (Signed)
Hemodialysis note  Received patient in bed to unit. Alert and oriented.  Informed consent signed and in chart.  Treatment initiated: 1139 Treatment completed: 1556  Patient tolerated well. Transported back to room, alert without acute distress.  Report given to patient's RN.   Access used: LUA AVG Access issues: none  Total UF removed: 3L Medication(s) given:  None  Post HD weight: 70.6 kg   Wolfgang Phoenix [facility]

## 2022-11-06 NOTE — TOC Progression Note (Addendum)
Transition of Care [facility]) - Progression Note    Patient Details  Name: Todd Brown MRN: [id number] Date of Birth: 1960-07-25  Transition of Care [facility]) CM/SW Contact  Liliana Cline, LCSW Phone Number: 11/06/2022, 2:21 PM  Clinical Narrative:    Confirmed with Elnita Maxwell with [facility] that they are active with this patient for Home Health services.  Call to [place] with [facility]. He confirms patient can come back this evening when he is back from dialysis, or can come back tomorrow. Doristine Mango states pharmacy is [facility] and they will be able to get patient's medication dose for tomorrow. Will need EMS transport to address in chart. Will need FL2 with DC Meds faxed to [phone] as well as a physical copy sent with him. Will depend on how patient is doing after dialysis per MD.    Expected Discharge Plan: Assisted Living    Expected Discharge Plan and Services                                               Social Determinants of Health (SDOH) Interventions SDOH Screenings   Food Insecurity: No Food Insecurity (09/16/2022)  Housing: Low Risk  (09/16/2022)  Transportation Needs: No Transportation Needs (09/16/2022)  Utilities: Not At Risk (09/16/2022)  Alcohol Screen: Low Risk  (10/05/2019)  Financial Resource Strain: Low Risk  (10/08/2018)  Physical Activity: Unknown (10/08/2018)  Social Connections: Unknown (10/08/2018)  Stress: No Stress Concern Present (10/08/2018)  Tobacco Use: Low Risk  (11/03/2022)    Readmission Risk Interventions    11/05/2022    4:39 PM 09/17/2022   12:00 PM  Readmission Risk Prevention Plan  Transportation Screening Complete Complete  Medication Review Oceanographer) Complete Complete  HRI or Home Care Consult Complete Complete  SW Recovery Care/Counseling Consult Complete   Skilled Nursing Facility Not Applicable Not Applicable

## 2022-11-06 NOTE — Progress Notes (Signed)
PT Cancellation Note  Patient Details Name: Todd Brown MRN: [id number] DOB: [birth year]   Cancelled Treatment:    Reason Eval/Treat Not Completed: Patient at procedure or test/unavailable.  Pt currently off floor at dialysis.  Will re-attempt PT session at a later date/time.  Hendricks Limes, PT 11/06/22, 2:09 PM

## 2022-11-06 NOTE — Plan of Care (Signed)
°  Problem: Education: °Goal: Knowledge of General Education information will improve °Description: Including pain rating scale, medication(s)/side effects and non-pharmacologic comfort measures °Outcome: Progressing °  °Problem: Clinical Measurements: °Goal: Ability to maintain clinical measurements within normal limits will improve °Outcome: Progressing °  °Problem: Nutrition: °Goal: Adequate nutrition will be maintained °Outcome: Progressing °  °

## 2022-11-07 DIAGNOSIS — I5A Non-ischemic myocardial injury (non-traumatic): Secondary | ICD-10-CM | POA: Diagnosis not present

## 2022-11-07 DIAGNOSIS — N186 End stage renal disease: Secondary | ICD-10-CM | POA: Diagnosis not present

## 2022-11-07 DIAGNOSIS — J9621 Acute and chronic respiratory failure with hypoxia: Secondary | ICD-10-CM | POA: Diagnosis not present

## 2022-11-07 DIAGNOSIS — J449 Chronic obstructive pulmonary disease, unspecified: Secondary | ICD-10-CM | POA: Diagnosis not present

## 2022-11-07 MED ORDER — LEVOTHYROXINE SODIUM 125 MCG PO TABS: 125.0000 ug | ORAL_TABLET | Freq: Every day | ORAL | 0 refills | Status: AC

## 2022-11-07 MED ORDER — VITAMIN D 25 MCG (1000 UNIT) PO TABS
1000.0000 [IU] | ORAL_TABLET | Freq: Every day | ORAL | Status: DC
  Administered 2022-11-07: 1000 [IU] via ORAL
  Filled 2022-11-07: qty 1

## 2022-11-07 NOTE — [place] FL2 (Signed)
[place] MEDICAID FL2 LEVEL OF CARE FORM     IDENTIFICATION  Patient Name: Todd Brown Birthdate: 1960-08-07 Sex: male Admission Date (Current Location): 11/03/2022  [place] Number:  Chiropodist and Address:  [facility], [address] [place], [place] [address]      Provider Number: [id number]  Attending Physician Name and Address:  Alford Highland, MD  Relative Name and Phone Number:  Julienne Kass [phone]    Current Level of Care: Hospital Recommended Level of Care: Assisted Living Facility Prior Approval Number:    Date Approved/Denied:   PASRR Number:    Discharge Plan: Other (Comment) [facility])    Current Diagnoses: Patient Active Problem List   Diagnosis Date Noted   Myocardial injury 11/03/2022   CAD (coronary artery disease) 11/03/2022   Hypothyroidism 11/03/2022   Thrombocytopenia (HCC) 11/03/2022   Aspiration pneumonia (HCC) 11/03/2022   Abdominal distension 11/03/2022   HTN (hypertension) 11/03/2022   Pericardial effusion 11/03/2022   CAP (community acquired pneumonia) 09/14/2022   Anasarca (mild pulmonary edema, pleural and pericardial effusion, ascites on CT) 09/14/2022   Acute on chronic respiratory failure (HCC) 09/14/2022   Bacteremia 06/20/2022   Acute encephalopathy 06/19/2022   SIRS (systemic inflammatory response syndrome) (HCC) 06/19/2022   NSTEMI (non-ST elevated myocardial infarction) (HCC) 06/19/2022   Acute on chronic respiratory failure with hypoxia (HCC) 05/21/2022   Hypoxia 05/20/2022   COVID-19 virus infection 05/20/2022   Hyponatremia 02/10/2022   Anemia due to chronic kidney disease 01/21/2022   Hyperkalemia 01/14/2022   Malnutrition of moderate degree 12/24/2021   Oral thrush 12/22/2021   Physical deconditioning 12/20/2021   Acute metabolic encephalopathy 12/15/2021   Essential hypertension 12/15/2021   ESRD on hemodialysis (HCC) 12/15/2021   Altered mental  status 10/08/2021   Hypertensive urgency 10/07/2021   COPD (chronic obstructive pulmonary disease) (HCC) 02/26/2021   GERD (gastroesophageal reflux disease) 02/26/2021   Hypertension 11/28/2020   Acute respiratory failure with hypoxia (HCC) 10/23/2019   Anemia of chronic disease 10/23/2019   Fluid overload 10/08/2018   Acute pulmonary edema (HCC) 09/12/2018   Scrotal edema 05/10/2017   Symptomatic anemia 04/30/2017   Anticoagulated on Coumadin 09/14/2016   Cardiac arrest (HCC) 08/03/2016   Severe sepsis versus SIRS 05/26/2016   Dialysis patient (HCC) 04/15/2016   End stage renal disease (HCC) 02/12/2016   Hyperkalemia 05/20/2015   Hepatitis C 09/26/2013   Schizophrenia (HCC) 03/23/2013   Essential hypertension, benign 03/23/2013   Chronic kidney disease 03/23/2013    Orientation RESPIRATION BLADDER Height & Weight     Situation, Self, Place  Normal Incontinent, External catheter Weight: 155 lb 10.3 oz (70.6 kg) Height:  5\' 11"  (180.3 cm)  BEHAVIORAL SYMPTOMS/MOOD NEUROLOGICAL BOWEL NUTRITION STATUS      Incontinent Diet (Cardiac Diet)  AMBULATORY STATUS COMMUNICATION OF NEEDS Skin   Limited Assist Verbally Normal                       Personal Care Assistance Level of Assistance  Bathing, Feeding, Dressing, Total care Bathing Assistance: Limited assistance Feeding assistance: Independent Dressing Assistance: Limited assistance Total Care Assistance: Limited assistance   Functional Limitations Info  Sight, Hearing, Speech Sight Info: Impaired (Blind L Eye R Eye) Hearing Info: Adequate Speech Info: Adequate    SPECIAL CARE FACTORS FREQUENCY  PT (By licensed PT)     PT Frequency: Active with [facility]  Contractures Contractures Info: Not present    Additional Factors Info  Code Status, Allergies Code Status Info: Full Allergies Info: Chlorpromazine           Current Medications (11/07/2022):  This is the current hospital  active medication list  Discharge Medications: Please see discharge summary for a list of discharge medications. albuterol 108 (90 Base) MCG/ACT inhaler Commonly known as: VENTOLIN HFA Inhale 1-2 puffs into the lungs every 4 (four) hours as needed for wheezing or shortness of breath.    amoxicillin-clavulanate 500-125 MG tablet Commonly known as: AUGMENTIN Take 1 tablet by mouth every evening.    ascorbic acid 500 MG tablet Commonly known as: VITAMIN C Take 500 mg by mouth daily.    b complex-vitamin c-folic acid 0.8 MG Tabs tablet Take 1 tablet by mouth at bedtime.    calcium acetate 667 MG capsule Commonly known as: PHOSLO Take 1,334 mg by mouth 3 (three) times daily with meals. 0800/1400/2000    carbamazepine 200 MG tablet Commonly known as: TEGRETOL Take 1,000 mg by mouth every morning.    cholecalciferol 1000 units tablet Commonly known as: VITAMIN D Take 1,000 Units by mouth in the morning.    doxazosin 4 MG tablet Commonly known as: CARDURA Take 1 tablet (4 mg total) by mouth at bedtime.    ferrous sulfate 325 (65 FE) MG tablet Take 1 tablet (325 mg total) by mouth daily.    fluPHENAZine 2.5 MG tablet Commonly known as: PROLIXIN Take 2.5 mg by mouth in the morning.    Fluticasone-Salmeterol 250-50 MCG/DOSE Aepb Commonly known as: ADVAIR Inhale 1 puff into the lungs 2 (two) times daily.    isosorbide mononitrate 60 MG 24 hr tablet Commonly known as: IMDUR Take 60 mg by mouth 2 (two) times daily.    levothyroxine 125 MCG tablet Commonly known as: Synthroid Take 1 tablet (125 mcg total) by mouth daily. Start taking on: November 08, 2022 What changed:  medication strength how much to take when to take this Another medication with the same name was removed. Continue taking this medication, and follow the directions you see here.    naproxen 500 MG tablet Commonly known as: NAPROSYN Take 500 mg by mouth 2 (two) times daily as needed for mild pain.     omeprazole 40 MG capsule Commonly known as: PRILOSEC Take 40 mg by mouth every morning.    polyethylene glycol 17 g packet Commonly known as: MIRALAX / GLYCOLAX Take 17 g by mouth daily as needed for mild constipation.    promethazine-dextromethorphan 6.25-15 MG/5ML syrup Commonly known as: PROMETHAZINE-DM Take 5 mLs by mouth every 6 (six) hours as needed for cough.    trihexyphenidyl 2 MG tablet Commonly known as: ARTANE Take 2 mg by mouth 2 (two) times daily.    zinc sulfate 220 (50 Zn) MG capsule Take 220 mg by mouth daily.   Relevant Imaging Results:  Relevant Lab Results:   Additional Information [id number]  Colette Ribas, [place]

## 2022-11-07 NOTE — Discharge Summary (Signed)
Physician Discharge Summary   Patient: Todd Brown MRN: [id number] DOB: 25-Aug-1960  Admit date:     11/03/2022  Discharge date: 11/07/22  Discharge Physician: Alford Highland   PCP: Smiley Houseman, NP   Recommendations at discharge:   Follow-up PCP 5 days Follow-up with dialysis on Monday Wednesday Friday.  Discharge Diagnoses: Principal Problem:   Acute on chronic respiratory failure with hypoxia (HCC) Active Problems:   Acute pulmonary edema (HCC)   Aspiration pneumonia (HCC)   COPD (chronic obstructive pulmonary disease) (HCC)   Acute metabolic encephalopathy   Bacteremia   ESRD on hemodialysis (HCC)   Myocardial injury   CAD (coronary artery disease)   Hypothyroidism   Thrombocytopenia (HCC)   Schizophrenia (HCC)   Abdominal distension   HTN (hypertension)   Pericardial effusion  Hospital Course: 62 y.o. male with medical history significant of ESRD-HD (MWF), schizophrenia, hypertension, hyperlipidemia, COPD on 2 L oxygen, CAD, hypothyroidism, GERD, remote right leg DVT 2018 not on anticoagulants currently, cardiac arrest, recent admission due to Strep bacteremia with negative TEE, who presents with shortness of breath and altered mental status.   Patient has AMS, is unable to provide any medical history, therefore, most of the history is obtained by discussing the case with ED physician, per EMS report, and with the nursing staff. I also called his legal guardian, Ms. Candice Gobble by phone. Per Ms Lenn Sink, at normal baseline, patient is able to engage in conversation, knows his own name, knows the place he is living, not sure if patient is always orientated to time.  Today patient is very confused, not oriented x 3 currently.  He moves all extremities.  Per report, patient has shortness of breath with severe respiratory distress, with oxygen desaturated to 55% on room air, initially required nonrebreather.  His respiratory status has  improved in ED, currently on  3 L oxygen with 96% of saturation.  No active cough noted.  No active nausea vomiting, diarrhea noted.  On my examination, patient has significant abdominal distention, not sure if patient has abdominal pain or chest pain.   Of note, patient was recently hospitalized from 4/15 - 4/25 due to strep gallolyticus bacteremia. TEE was negative.  Patient finished a course of cefazolin. 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  Per her legal guardian, patient refused colonoscopy.  6/5.  After dialysis patient's weight is down to 153.66 pounds.  Patient is a poor historian. 6/6.  Patient's mental status much improved today.  TSH elevated at 60 will increase levothyroxine.  Blood pressure low this afternoon will get rid of minoxidil and cut back on hydralazine dosing. 6/7.  Holding all blood pressure medications.   6/8.  Patient stable to go back to his facility.  Assessment and Plan: * Acute on chronic respiratory failure with hypoxia (HCC) Suspect all secondary to pulmonary edema.  Initially required a nonrebreather secondary to respiratory distress.  Now back to baseline 2 L.  Acute pulmonary edema (HCC) Acute diastolic congestive heart failure.  Patient had 3 dialysis sessions.  Weight after dialysis 155.65 pounds.  Aspiration pneumonia (HCC) Continue Augmentin for 1 more dose this evening.  COPD (chronic obstructive pulmonary disease) (HCC) No need for steroids.  Acute metabolic encephalopathy Patient answers some simple questions..  Bacteremia On previous hospitalization on 4/15 had Streptococcus gallolyticus in blood cultures.  Repeat blood cultures on 09/17/22 were negative for 5 days.  Blood cultures so far negative this hospitalization.  ESRD  on hemodialysis [facility]) Patient had hemodialysis 3 sessions during this hospital course.  Myocardial injury Only borderline troponins secondary to demand ischemia with heart  failure.  Hypothyroidism TSH very elevated at 60.  Increased levothyroxine to 100 mcg daily and will likely have to increase to 125 for discharge.  Recommended a checking TFTs in 6 weeks.  Thrombocytopenia (HCC) Resolved.  Platelet count in normal range 180.  Schizophrenia (HCC) Patient on artene and Prolixin.  Pericardial effusion Cardiology does not recommend intervention.  Moderate pericardial effusion without tamponade seen on echocardiogram.  HTN (hypertension) Blood pressure on the lower side during this hospital course.  Held all BP meds except for Cardura.         Consultants: Nephrology Procedures performed: 3 dialysis sessions Disposition: Assisted living Diet recommendation:  Cardiac diet DISCHARGE MEDICATION: Allergies as of 11/07/2022       Reactions   Chlorpromazine Other (See Comments)   Reaction:  Unknown , pt states it makes him feel real bad Reaction:  Unknown , pt states it makes him feel real bad        Medication List     STOP taking these medications    amLODipine 10 MG tablet Commonly known as: NORVASC   cloNIDine 0.2 mg/24hr patch Commonly known as: CATAPRES - Dosed in mg/24 hr   hydrALAZINE 25 MG tablet Commonly known as: APRESOLINE   minoxidil 2.5 MG tablet Commonly known as: LONITEN       TAKE these medications    albuterol 108 (90 Base) MCG/ACT inhaler Commonly known as: VENTOLIN HFA Inhale 1-2 puffs into the lungs every 4 (four) hours as needed for wheezing or shortness of breath.   amoxicillin-clavulanate 500-125 MG tablet Commonly known as: AUGMENTIN Take 1 tablet by mouth every evening.   ascorbic acid 500 MG tablet Commonly known as: VITAMIN C Take 500 mg by mouth daily.   b complex-vitamin c-folic acid 0.8 MG Tabs tablet Take 1 tablet by mouth at bedtime.   calcium acetate 667 MG capsule Commonly known as: PHOSLO Take 1,334 mg by mouth 3 (three) times daily with meals. 0800/1400/2000   carbamazepine 200 MG  tablet Commonly known as: TEGRETOL Take 1,000 mg by mouth every morning.   cholecalciferol 1000 units tablet Commonly known as: VITAMIN D Take 1,000 Units by mouth in the morning.   doxazosin 4 MG tablet Commonly known as: CARDURA Take 1 tablet (4 mg total) by mouth at bedtime.   ferrous sulfate 325 (65 FE) MG tablet Take 1 tablet (325 mg total) by mouth daily.   fluPHENAZine 2.5 MG tablet Commonly known as: PROLIXIN Take 2.5 mg by mouth in the morning.   Fluticasone-Salmeterol 250-50 MCG/DOSE Aepb Commonly known as: ADVAIR Inhale 1 puff into the lungs 2 (two) times daily.   isosorbide mononitrate 60 MG 24 hr tablet Commonly known as: IMDUR Take 60 mg by mouth 2 (two) times daily.   levothyroxine 125 MCG tablet Commonly known as: Synthroid Take 1 tablet (125 mcg total) by mouth daily. Start taking on: November 08, 2022 What changed:  medication strength how much to take when to take this Another medication with the same name was removed. Continue taking this medication, and follow the directions you see here.   naproxen 500 MG tablet Commonly known as: NAPROSYN Take 500 mg by mouth 2 (two) times daily as needed for mild pain.   omeprazole 40 MG capsule Commonly known as: PRILOSEC Take 40 mg by mouth every morning.   polyethylene glycol 17  g packet Commonly known as: MIRALAX / GLYCOLAX Take 17 g by mouth daily as needed for mild constipation.   promethazine-dextromethorphan 6.25-15 MG/5ML syrup Commonly known as: PROMETHAZINE-DM Take 5 mLs by mouth every 6 (six) hours as needed for cough.   trihexyphenidyl 2 MG tablet Commonly known as: ARTANE Take 2 mg by mouth 2 (two) times daily.   zinc sulfate 220 (50 Zn) MG capsule Take 220 mg by mouth daily.        Follow-up Information     Smiley Houseman, NP Follow up in 5 day(s).   Specialty: Family Medicine Contact information: [address] [place] [address] [phone]                 Discharge Exam: Ceasar Mons Weights   11/04/22 1226 11/06/22 1137 11/06/22 1556  Weight: 69.7 kg 72.4 kg 70.6 kg   Physical Exam HENT:     Head: Normocephalic.     Mouth/Throat:     Pharynx: No oropharyngeal exudate.  Eyes:     General: Lids are normal.     Conjunctiva/sclera: Conjunctivae normal.  Cardiovascular:     Rate and Rhythm: Normal rate and regular rhythm.     Heart sounds: Normal heart sounds, S1 normal and S2 normal.  Pulmonary:     Breath sounds: No decreased breath sounds, wheezing, rhonchi or rales.  Abdominal:     Palpations: Abdomen is soft.     Tenderness: There is no abdominal tenderness.  Musculoskeletal:     Right lower leg: No swelling.     Left lower leg: No swelling.  Skin:    General: Skin is warm.     Findings: No rash.  Neurological:     Mental Status: He is alert.      Condition at discharge: stable  The results of significant diagnostics from this hospitalization (including imaging, microbiology, ancillary and laboratory) are listed below for reference.   Imaging Studies: ECHOCARDIOGRAM LIMITED  Result Date: 11/04/2022    ECHOCARDIOGRAM LIMITED REPORT   Patient Name:   HAFIZ SOLOMONS Date of Exam: 11/04/2022 Medical Rec #:  [id number]        Height:       71.0 in Accession #:    [id number]       Weight:       153.7 lb Date of Birth:  Jul 14, 1960        BSA:          1.885 m Patient Age:    62 years         BP:           127/76 mmHg Patient Gender: M                HR:           65 bpm. Exam Location:  [facility] Procedure: Limited Echo, Color Doppler and Cardiac Doppler Indications:     Pericardial Effusion I31.3  History:         Patient has prior history of Echocardiogram examinations, most                  recent 09/17/2022. COPD; Risk Factors:Hypertension. Dialysis                  patient.  Sonographer:     Cristela Blue Referring Phys:  [id number] [facility] MICHELLE TANG Diagnosing Phys: Marcina Millard MD IMPRESSIONS  1. Left ventricular ejection fraction, by  estimation, is 65 to 70%. The left ventricle has  normal function. There is moderate left ventricular hypertrophy.  2. Moderate pericardial effusion. The pericardial effusion is lateral to the left ventricle. There is no evidence of cardiac tamponade. Moderate pleural effusion.  3. Mild mitral valve regurgitation.  4. Tricuspid valve regurgitation is mild to moderate. FINDINGS  Left Ventricle: Left ventricular ejection fraction, by estimation, is 65 to 70%. The left ventricle has normal function. There is moderate left ventricular hypertrophy. Pericardium: A moderately sized pericardial effusion is present. The pericardial effusion is lateral to the left ventricle. There is no evidence of cardiac tamponade. Mitral Valve: Mild mitral valve regurgitation. Tricuspid Valve: Tricuspid valve regurgitation is mild to moderate. Additional Comments: There is a moderate pleural effusion. Spectral Doppler performed. Color Doppler performed.  LEFT VENTRICLE PLAX 2D LVIDd:         4.00 cm LVIDs:         2.10 cm LV PW:         2.30 cm LV IVS:        2.10 cm LVOT diam:     2.30 cm LVOT Area:     4.15 cm  RIGHT VENTRICLE RV Basal diam:  4.60 cm RV Mid diam:    3.70 cm LEFT ATRIUM         Index LA diam:    3.10 cm 1.64 cm/m                        PULMONIC VALVE AORTA                 PR End Diast Vel: 12.25 msec Ao Root diam: 3.30 cm  MITRAL VALVE MV Area (PHT): 1.61 cm    SHUNTS MV Decel Time: 472 msec    Systemic Diam: 2.30 cm MV E velocity: 58.70 cm/s MV A velocity: 93.30 cm/s MV E/A ratio:  0.63 Marcina Millard MD Electronically signed by Marcina Millard MD Signature Date/Time: 11/04/2022/4:24:00 PM    Final    CT ABDOMEN PELVIS WO CONTRAST  Result Date: 11/03/2022 CLINICAL DATA:  Bowel obstruction suspected, agitation EXAM: CT CHEST, ABDOMEN AND PELVIS WITHOUT CONTRAST TECHNIQUE: Multidetector CT imaging of the chest, abdomen and pelvis was performed following the standard protocol without IV contrast. RADIATION DOSE  REDUCTION: This exam was performed according to the departmental dose-optimization program which includes automated exposure control, adjustment of the mA and/or kV according to patient size and/or use of iterative reconstruction technique. COMPARISON:  09/14/2022 FINDINGS: CT CHEST FINDINGS Cardiovascular: Mild cardiomegaly. Moderately large pericardial effusion, increased since previous. Scattered coronary and aortic calcifications. Mediastinum/Nodes: No mass or adenopathy. Lungs/Pleura: Small pleural effusions left greater than right as before. No pneumothorax. Patchy atelectasis/consolidation in the lower lobes with air bronchograms, left worse than right, slightly increased from previous. Portal edema and some and atelectasis in the upper lobes, new since previous. Musculoskeletal: Healing manubrial fracture.  No acute findings. CT ABDOMEN PELVIS FINDINGS Hepatobiliary: Layering hyperdensities in the dependent aspect of the nondilated gallbladder. No focal liver lesion or biliary ductal dilatation. Pancreas: No mass or ductal dilatation. Increase in surrounding retroperitoneal edema/fluid. Spleen: Normal in size without focal abnormality. Adrenals/Urinary Tract: Bilateral adrenal hypertrophy. Bilateral renal parenchymal atrophy with extensive cystic change. No urolithiasis or hydronephrosis. Urinary bladder nondistended. Stomach/Bowel: Stomach is incompletely distended, unremarkable. There is circumferential wall thickening in multiple loops of small bowel in the left mid abdomen. No dilated loops are evident however. Normal appendix. The colon is incompletely distended by gas and fecal material, without acute finding.  Vascular/Lymphatic: Extensive aortoiliac calcified atheromatous plaque without aneurysm. No abdominal or pelvic adenopathy. Reproductive: Prostate is unremarkable. Other: Bilateral pelvic phleboliths. Moderate abdominal ascites, increased since previous. No free air. Musculoskeletal: No acute or  significant osseous findings. IMPRESSION: 1. Moderately large pericardial effusion, increased since previous. 2. Small bilateral pleural effusions, increased since previous. 3. Worsening atelectasis/consolidation in the lower lobes, left worse than right. 4. Moderate abdominal ascites, increased since previous. 5. Circumferential wall thickening in multiple loops of small bowel in the left mid abdomen, possibly enteritis. No evidence of obstruction. 6.  Aortic Atherosclerosis (ICD10-I70.0). Electronically Signed   By: Corlis Leak M.D.   On: 11/03/2022 14:15   CT CHEST WO CONTRAST  Result Date: 11/03/2022 CLINICAL DATA:  Bowel obstruction suspected, agitation EXAM: CT CHEST, ABDOMEN AND PELVIS WITHOUT CONTRAST TECHNIQUE: Multidetector CT imaging of the chest, abdomen and pelvis was performed following the standard protocol without IV contrast. RADIATION DOSE REDUCTION: This exam was performed according to the departmental dose-optimization program which includes automated exposure control, adjustment of the mA and/or kV according to patient size and/or use of iterative reconstruction technique. COMPARISON:  09/14/2022 FINDINGS: CT CHEST FINDINGS Cardiovascular: Mild cardiomegaly. Moderately large pericardial effusion, increased since previous. Scattered coronary and aortic calcifications. Mediastinum/Nodes: No mass or adenopathy. Lungs/Pleura: Small pleural effusions left greater than right as before. No pneumothorax. Patchy atelectasis/consolidation in the lower lobes with air bronchograms, left worse than right, slightly increased from previous. Portal edema and some and atelectasis in the upper lobes, new since previous. Musculoskeletal: Healing manubrial fracture.  No acute findings. CT ABDOMEN PELVIS FINDINGS Hepatobiliary: Layering hyperdensities in the dependent aspect of the nondilated gallbladder. No focal liver lesion or biliary ductal dilatation. Pancreas: No mass or ductal dilatation. Increase in  surrounding retroperitoneal edema/fluid. Spleen: Normal in size without focal abnormality. Adrenals/Urinary Tract: Bilateral adrenal hypertrophy. Bilateral renal parenchymal atrophy with extensive cystic change. No urolithiasis or hydronephrosis. Urinary bladder nondistended. Stomach/Bowel: Stomach is incompletely distended, unremarkable. There is circumferential wall thickening in multiple loops of small bowel in the left mid abdomen. No dilated loops are evident however. Normal appendix. The colon is incompletely distended by gas and fecal material, without acute finding. Vascular/Lymphatic: Extensive aortoiliac calcified atheromatous plaque without aneurysm. No abdominal or pelvic adenopathy. Reproductive: Prostate is unremarkable. Other: Bilateral pelvic phleboliths. Moderate abdominal ascites, increased since previous. No free air. Musculoskeletal: No acute or significant osseous findings. IMPRESSION: 1. Moderately large pericardial effusion, increased since previous. 2. Small bilateral pleural effusions, increased since previous. 3. Worsening atelectasis/consolidation in the lower lobes, left worse than right. 4. Moderate abdominal ascites, increased since previous. 5. Circumferential wall thickening in multiple loops of small bowel in the left mid abdomen, possibly enteritis. No evidence of obstruction. 6.  Aortic Atherosclerosis (ICD10-I70.0). Electronically Signed   By: Corlis Leak M.D.   On: 11/03/2022 14:15   CT HEAD WO CONTRAST ( )  Result Date: 11/03/2022 CLINICAL DATA:  Mental status change, unknown cause. EXAM: CT HEAD WITHOUT CONTRAST TECHNIQUE: Contiguous axial images were obtained from the base of the skull through the vertex without intravenous contrast. RADIATION DOSE REDUCTION: This exam was performed according to the departmental dose-optimization program which includes automated exposure control, adjustment of the mA and/or kV according to patient size and/or use of iterative  reconstruction technique. COMPARISON:  Head CT 06/19/2022. FINDINGS: Brain: No acute hemorrhage. Unchanged mild chronic small-vessel disease. Cortical gray-white differentiation is otherwise preserved. Prominence of the ventricles and sulci within expected range for age. No hydrocephalus or extra-axial collection. No  mass effect or midline shift. Vascular: No hyperdense vessel or unexpected calcification. Skull: No calvarial fracture or suspicious bone lesion. Skull base is unremarkable. Sinuses/Orbits: Orbits and paranasal sinuses are unremarkable. Bilateral mastoid effusions. Other: None. IMPRESSION: 1. No acute intracranial abnormality. 2. Unchanged mild chronic small-vessel disease. 3. Bilateral mastoid effusions. Electronically Signed   By: Orvan Falconer M.D.   On: 11/03/2022 11:16   DG Chest Portable 1 View  Result Date: 11/03/2022 CLINICAL DATA:  Respiratory distress EXAM: PORTABLE CHEST 1 VIEW COMPARISON:  Chest radiograph dated 09/14/2022 FINDINGS: Normal lung volumes. Moderate pulmonary edema. Increased dense left retrocardiac opacity. Trace bilateral pleural effusions. No pneumothorax. Similar marked cardiomegaly. No acute osseous abnormality. IMPRESSION: 1. Moderate pulmonary edema.  Trace bilateral pleural effusions. 2. Increased dense left retrocardiac opacity, likely atelectasis. Aspiration or pneumonia can be considered in the appropriate clinical setting. 3. Similar marked cardiomegaly. Electronically Signed   By: Agustin Cree M.D.   On: 11/03/2022 10:42    Microbiology: Results for orders placed or performed during the hospital encounter of 11/03/22  SARS Coronavirus 2 by RT PCR (hospital order, performed in [facility] hospital lab) *cepheid single result test* Anterior Nasal Swab     Status: None   Collection Time: 11/03/22  9:36 AM   Specimen: Anterior Nasal Swab  Result Value Ref Range Status   SARS Coronavirus 2 by RT PCR NEGATIVE NEGATIVE Final    Comment: (NOTE) SARS-CoV-2 target  nucleic acids are NOT DETECTED.  The SARS-CoV-2 RNA is generally detectable in upper and lower respiratory specimens during the acute phase of infection. The lowest concentration of SARS-CoV-2 viral copies this assay can detect is 250 copies / mL. A negative result does not preclude SARS-CoV-2 infection and should not be used as the sole basis for treatment or other patient management decisions.  A negative result may occur with improper specimen collection / handling, submission of specimen other than nasopharyngeal swab, presence of viral mutation(s) within the areas targeted by this assay, and inadequate number of viral copies (<250 copies / mL). A negative result must be combined with clinical observations, patient history, and epidemiological information.  Fact Sheet for Patients:   RoadLapTop.co.za  Fact Sheet for Healthcare Providers: http://kim-miller.com/  This test is not yet approved or  cleared by the [place] FDA and has been authorized for detection and/or diagnosis of SARS-CoV-2 by FDA under an Emergency Use Authorization (EUA).  This EUA will remain in effect (meaning this test can be used) for the duration of the COVID-19 declaration under Section 564(b)(1) of the Act, 21 U.S.C. section 360bbb-3(b)(1), unless the authorization is terminated or revoked sooner.  Performed at [facility], [address] [place], [place] [address]   Blood culture (routine x 2)     Status: None (Preliminary result)   Collection Time: 11/03/22 11:45 AM   Specimen: BLOOD  Result Value Ref Range Status   Specimen Description BLOOD RIGHT HAND  Final   Special Requests   Final    BOTTLES DRAWN AEROBIC AND ANAEROBIC Blood Culture adequate volume   Culture   Final    NO GROWTH 4 DAYS Performed at [facility], [address] [place], [place] [address]    Report Status PENDING  Incomplete  Blood culture (routine x  2)     Status: None (Preliminary result)   Collection Time: 11/03/22 11:47 AM   Specimen: BLOOD  Result Value Ref Range Status   Specimen Description BLOOD RIGHT ARM  Final  Special Requests   Final    BOTTLES DRAWN AEROBIC AND ANAEROBIC Blood Culture results may not be optimal due to an inadequate volume of blood received in culture bottles   Culture   Final    NO GROWTH 4 DAYS Performed at [facility], [address] [place], [place] [address]    Report Status PENDING  Incomplete  MRSA Next Gen by PCR, Nasal     Status: None   Collection Time: 11/03/22  9:23 PM   Specimen: Nasal Mucosa; Nasal Swab  Result Value Ref Range Status   MRSA by PCR Next Gen NOT DETECTED NOT DETECTED Final    Comment: (NOTE) The GeneXpert MRSA Assay (FDA approved for NASAL specimens only), is one component of a comprehensive MRSA colonization surveillance program. It is not intended to diagnose MRSA infection nor to guide or monitor treatment for MRSA infections. Test performance is not FDA approved in patients less than 68 years old. Performed at [facility], [address] [place], [place] [address]   Expectorated Sputum Assessment w Gram Stain, Rflx to Resp Cult     Status: None   Collection Time: 11/03/22  9:39 PM   Specimen: Sputum  Result Value Ref Range Status   Specimen Description SPUTUM  Final   Special Requests NONE  Final   Sputum evaluation   Final    Sputum specimen not acceptable for testing.  Please recollect.   C/ JENA SHANNON RN @2355  11/03/22 ASW Performed at [facility], [address] [place], [place] [address]    Report Status 11/03/2022 FINAL  Final    Labs: CBC: Recent Labs  Lab 11/03/22 0928 11/04/22 0814 11/06/22 1148  WBC 4.9 4.4 4.6  NEUTROABS 3.7  --   --   HGB 12.5* 12.9* 12.5*  HCT 39.3 39.6 37.9*  MCV 105.9* 101.5* 101.3*  PLT 136* 173 180   Basic Metabolic Panel: Recent Labs  Lab 11/03/22 0936 11/04/22 0814  11/06/22 1148  NA 139 138 136  K 4.3 4.0 4.2  CL 95* 96* 96*  CO2 28 31 30   GLUCOSE 88 71 143*  BUN 27* 19 33*  CREATININE 5.66* 4.89* 6.12*  CALCIUM 9.4 9.0 9.2  PHOS  --   --  3.2   Liver Function Tests: Recent Labs  Lab 11/03/22 0936 11/06/22 1148  AST 31  --   ALT 6  --   ALKPHOS 63  --   BILITOT 0.8  --   PROT 7.3  --   ALBUMIN 3.6 3.3*   CBG: Recent Labs  Lab 11/03/22 2020  GLUCAP 77    Discharge time spent: greater than 30 minutes.  Signed: Alford Highland, MD [facility] 11/07/2022

## 2022-11-07 NOTE — Progress Notes (Signed)
[facility]  ROUNDING NOTE   Subjective:   Todd Brown was admitted to [facility] on 11/03/2022 for Acute on chronic respiratory failure with hypoxia (HCC) [J96.21]  Patient sitting up in bed Completed breakfast tray on bedside  Requesting his coffee  No lower extremity edema   Objective:  Vital signs in last 24 hours:  Temp:  [98 F (36.7 C)-98.7 F (37.1 C)] 98 F (36.7 C) (06/08 0820) Pulse Rate:  [57-64] 57 (06/08 0820) Resp:  [12-20] 14 (06/08 0820) BP: (96-131)/(68-82) 131/81 (06/08 0820) SpO2:  [94 %-100 %] 96 % (06/08 0820) Weight:  [70.6 kg] 70.6 kg (06/07 1556)  Weight change:  Filed Weights   11/04/22 1226 11/06/22 1137 11/06/22 1556  Weight: 69.7 kg 72.4 kg 70.6 kg    Intake/Output: I/O last 3 completed shifts: In: 780 [P.O.:780] Out: 3000 [Other:3000]   Intake/Output this shift:  No intake/output data recorded.  Physical Exam: General: NAD  Head: Normocephalic, atraumatic. Moist oral mucosal membranes  Eyes: Anicteric  Lungs:  Diminished in bases, [place] O2  Heart: Regular rate and rhythm  Abdomen:  Soft, nontender  Extremities:  No peripheral edema.  Neurologic: Alert and oriented, moving all four extremities  Skin: No lesions  Access: Left AVF    Basic Metabolic Panel: Recent Labs  Lab 11/03/22 0936 11/04/22 0814 11/06/22 1148  NA 139 138 136  K 4.3 4.0 4.2  CL 95* 96* 96*  CO2 28 31 30   GLUCOSE 88 71 143*  BUN 27* 19 33*  CREATININE 5.66* 4.89* 6.12*  CALCIUM 9.4 9.0 9.2  PHOS  --   --  3.2     Liver Function Tests: Recent Labs  Lab 11/03/22 0936 11/06/22 1148  AST 31  --   ALT 6  --   ALKPHOS 63  --   BILITOT 0.8  --   PROT 7.3  --   ALBUMIN 3.6 3.3*    No results for input(s): "LIPASE", "AMYLASE" in the last 168 hours. No results for input(s): "AMMONIA" in the last 168 hours.  CBC: Recent Labs  Lab 11/03/22 0928 11/04/22 0814 11/06/22 1148  WBC 4.9 4.4 4.6  NEUTROABS 3.7  --   --   HGB 12.5*  12.9* 12.5*  HCT 39.3 39.6 37.9*  MCV 105.9* 101.5* 101.3*  PLT 136* 173 180     Cardiac Enzymes: No results for input(s): "CKTOTAL", "CKMB", "CKMBINDEX", "TROPONINI" in the last 168 hours.  BNP: Invalid input(s): "POCBNP"  CBG: Recent Labs  Lab 11/03/22 2020  GLUCAP 77     Microbiology: Results for orders placed or performed during the hospital encounter of 11/03/22  SARS Coronavirus 2 by RT PCR (hospital order, performed in [facility] hospital lab) *cepheid single result test* Anterior Nasal Swab     Status: None   Collection Time: 11/03/22  9:36 AM   Specimen: Anterior Nasal Swab  Result Value Ref Range Status   SARS Coronavirus 2 by RT PCR NEGATIVE NEGATIVE Final    Comment: (NOTE) SARS-CoV-2 target nucleic acids are NOT DETECTED.  The SARS-CoV-2 RNA is generally detectable in upper and lower respiratory specimens during the acute phase of infection. The lowest concentration of SARS-CoV-2 viral copies this assay can detect is 250 copies / mL. A negative result does not preclude SARS-CoV-2 infection and should not be used as the sole basis for treatment or other patient management decisions.  A negative result may occur with improper specimen collection / handling, submission of specimen other  than nasopharyngeal swab, presence of viral mutation(s) within the areas targeted by this assay, and inadequate number of viral copies (<250 copies / mL). A negative result must be combined with clinical observations, patient history, and epidemiological information.  Fact Sheet for Patients:   RoadLapTop.co.za  Fact Sheet for Healthcare Providers: http://kim-miller.com/  This test is not yet approved or  cleared by the [place] FDA and has been authorized for detection and/or diagnosis of SARS-CoV-2 by FDA under an Emergency Use Authorization (EUA).  This EUA will remain in effect (meaning this test can be used) for the  duration of the COVID-19 declaration under Section 564(b)(1) of the Act, 21 U.S.C. section 360bbb-3(b)(1), unless the authorization is terminated or revoked sooner.  Performed at [facility], [address] [place], [place] [address]   Blood culture (routine x 2)     Status: None (Preliminary result)   Collection Time: 11/03/22 11:45 AM   Specimen: BLOOD  Result Value Ref Range Status   Specimen Description BLOOD RIGHT HAND  Final   Special Requests   Final    BOTTLES DRAWN AEROBIC AND ANAEROBIC Blood Culture adequate volume   Culture   Final    NO GROWTH 4 DAYS Performed at [facility], [address] [place], [place] [address]    Report Status PENDING  Incomplete  Blood culture (routine x 2)     Status: None (Preliminary result)   Collection Time: 11/03/22 11:47 AM   Specimen: BLOOD  Result Value Ref Range Status   Specimen Description BLOOD RIGHT ARM  Final   Special Requests   Final    BOTTLES DRAWN AEROBIC AND ANAEROBIC Blood Culture results may not be optimal due to an inadequate volume of blood received in culture bottles   Culture   Final    NO GROWTH 4 DAYS Performed at [facility], [address], [place], [place] [address]    Report Status PENDING  Incomplete  MRSA Next Gen by PCR, Nasal     Status: None   Collection Time: 11/03/22  9:23 PM   Specimen: Nasal Mucosa; Nasal Swab  Result Value Ref Range Status   MRSA by PCR Next Gen NOT DETECTED NOT DETECTED Final    Comment: (NOTE) The GeneXpert MRSA Assay (FDA approved for NASAL specimens only), is one component of a comprehensive MRSA colonization surveillance program. It is not intended to diagnose MRSA infection nor to guide or monitor treatment for MRSA infections. Test performance is not FDA approved in patients less than 22 years old. Performed at [facility], [address] [place], [place] [address]   Expectorated Sputum Assessment w Gram Stain, Rflx to  Resp Cult     Status: None   Collection Time: 11/03/22  9:39 PM   Specimen: Sputum  Result Value Ref Range Status   Specimen Description SPUTUM  Final   Special Requests NONE  Final   Sputum evaluation   Final    Sputum specimen not acceptable for testing.  Please recollect.   C/ Julienne Kass RN @2355  11/03/22 ASW Performed at [facility], [address] [place], [place] [address]    Report Status 11/03/2022 FINAL  Final    Coagulation Studies: No results for input(s): "LABPROT", "INR" in the last 72 hours.   Urinalysis: No results for input(s): "COLORURINE", "LABSPEC", "PHURINE", "GLUCOSEU", "HGBUR", "BILIRUBINUR", "KETONESUR", "PROTEINUR", "UROBILINOGEN", "NITRITE", "LEUKOCYTESUR" in the last 72 hours.  Invalid input(s): "APPERANCEUR"    Imaging: No results found.  Medications:      amoxicillin-clavulanate  1 tablet Oral BID   ascorbic acid  500 mg Oral Daily   aspirin EC  81 mg Oral Daily   calcium acetate  1,334 mg Oral TID WC   carbamazepine  1,000 mg Oral q morning   cholecalciferol  1,000 Units Oral Daily   doxazosin  4 mg Oral QHS   ferrous sulfate  325 mg Oral Daily   fluPHENAZine  2.5 mg Oral Daily   heparin  5,000 Units Subcutaneous Q8H   isosorbide mononitrate  60 mg Oral BID   levothyroxine  100 mcg Oral q AM   mometasone-formoterol  2 puff Inhalation BID   multivitamin  1 tablet Oral QHS   pantoprazole  40 mg Oral Daily   trihexyphenidyl  2 mg Oral BID WC   zinc sulfate  220 mg Oral Daily   acetaminophen, acetaminophen, albuterol, dextromethorphan-guaiFENesin, haloperidol lactate, naproxen, ondansetron (ZOFRAN) IV, mouth rinse  Assessment/ Plan:  Mr. MARLIN DENNEN is a 62 y.o.  male with end stage renal disease on hemodialysis, hypertension, schizophrenia, DVT, COPD, who is admitted to [facility] on 11/03/2022 for Acute on chronic respiratory failure with hypoxia (HCC) [J96.21]  [facility] M[facility] Left AVF 79.5kg  End Stage Renal  Disease: with volume overload. Dialysis received on Friday, UF 3L. Next treatment scheduled for Monday. Patient cleared to discharge from renal stance.   Hypertension with chronic kidney disease: home regimen of amlodipine, clonidine, doxazosin, hydralazine, minoxidil,.  Blood pressure stable  Anemia with chronic kidney disease: hemoglobin remains stable.    Secondary Hyperparathyroidism:  - Calcium and phosphorus within desired range.  - Continue calcium acetate with meals   LOS: 4   6/8/202411:59 AM

## 2022-11-07 NOTE — Progress Notes (Signed)
Attempted to call report [phone]. Asked to call Doristine Mango [phone] but called forwarded to voice mail. I have requested him to call me back

## 2022-11-08 LAB — CULTURE, BLOOD (ROUTINE X 2): Culture: NO GROWTH

## 2022-12-21 ENCOUNTER — Other Ambulatory Visit: Payer: Self-pay | Admitting: Nephrology

## 2022-12-21 DIAGNOSIS — R188 Other ascites: Secondary | ICD-10-CM

## 2022-12-22 ENCOUNTER — Ambulatory Visit: Payer: MEDICAID

## 2022-12-24 ENCOUNTER — Ambulatory Visit
Admission: RE | Admit: 2022-12-24 | Discharge: 2022-12-24 | Disposition: A | Discharge status: Home / Self Care | Payer: MEDICAID | Source: Ambulatory Visit | Attending: Nephrology | Admitting: Nephrology

## 2022-12-24 DIAGNOSIS — R188 Other ascites: Secondary | ICD-10-CM | POA: Insufficient documentation

## 2022-12-24 MED ORDER — LIDOCAINE HCL (PF) 1 % IJ SOLN
10.0000 mL | Freq: Once | INTRAMUSCULAR | Status: AC
  Administered 2022-12-24: 10 mL via INTRADERMAL
  Filled 2022-12-24: qty 10

## 2022-12-24 NOTE — Procedures (Signed)
PROCEDURE SUMMARY:  Successful US guided paracentesis from RUQ. Yielded 4.8 L of clear yellow fluid.  No immediate complications.  Pt tolerated well.   Specimen was not sent for labs.  EBL < 5mL  Cloretta Ned 12/24/2022 9:58 AM

## 2023-02-08 ENCOUNTER — Other Ambulatory Visit: Payer: Self-pay | Admitting: Nephrology

## 2023-02-08 DIAGNOSIS — R188 Other ascites: Secondary | ICD-10-CM

## 2023-02-16 ENCOUNTER — Other Ambulatory Visit: Payer: Self-pay | Admitting: Nephrology

## 2023-02-16 ENCOUNTER — Ambulatory Visit
Admission: RE | Admit: 2023-02-16 | Discharge: 2023-02-16 | Disposition: A | Discharge status: Home / Self Care | Payer: MEDICAID | Source: Ambulatory Visit | Attending: Nephrology | Admitting: Nephrology

## 2023-02-16 DIAGNOSIS — R188 Other ascites: Secondary | ICD-10-CM | POA: Insufficient documentation

## 2023-02-16 NOTE — Progress Notes (Signed)
62 y.o male outpatient. History of schizophrenia, renal artery stenosis, HTN, HLD, ESRD with recurrent ascites. Patient presents for therapeutic paracentesis. Due to inability to obtain consent from the Patient's legal guardian the procedure will be rescheduled.   Will continue to attempt to get procedural consent

## 2023-04-30 ENCOUNTER — Emergency Department: Payer: MEDICAID

## 2023-04-30 ENCOUNTER — Inpatient Hospital Stay
Admission: EM | Admit: 2023-04-30 | Discharge: 2023-05-04 | DRG: 189 | Disposition: A | Discharge status: Home / Self Care | Payer: MEDICAID | Source: Ambulatory Visit | Attending: Osteopathic Medicine | Admitting: Osteopathic Medicine

## 2023-04-30 ENCOUNTER — Other Ambulatory Visit: Payer: Self-pay

## 2023-04-30 DIAGNOSIS — H543 Unqualified visual loss, both eyes: Secondary | ICD-10-CM | POA: Diagnosis present

## 2023-04-30 DIAGNOSIS — J9601 Acute respiratory failure with hypoxia: Secondary | ICD-10-CM | POA: Diagnosis present

## 2023-04-30 DIAGNOSIS — Z79890 Hormone replacement therapy (postmenopausal): Secondary | ICD-10-CM

## 2023-04-30 DIAGNOSIS — F17210 Nicotine dependence, cigarettes, uncomplicated: Secondary | ICD-10-CM | POA: Diagnosis present

## 2023-04-30 DIAGNOSIS — D631 Anemia in chronic kidney disease: Secondary | ICD-10-CM | POA: Diagnosis present

## 2023-04-30 DIAGNOSIS — E785 Hyperlipidemia, unspecified: Secondary | ICD-10-CM | POA: Diagnosis present

## 2023-04-30 DIAGNOSIS — R058 Other specified cough: Secondary | ICD-10-CM | POA: Insufficient documentation

## 2023-04-30 DIAGNOSIS — Z7951 Long term (current) use of inhaled steroids: Secondary | ICD-10-CM

## 2023-04-30 DIAGNOSIS — K219 Gastro-esophageal reflux disease without esophagitis: Secondary | ICD-10-CM | POA: Diagnosis present

## 2023-04-30 DIAGNOSIS — I120 Hypertensive chronic kidney disease with stage 5 chronic kidney disease or end stage renal disease: Secondary | ICD-10-CM | POA: Diagnosis present

## 2023-04-30 DIAGNOSIS — N2581 Secondary hyperparathyroidism of renal origin: Secondary | ICD-10-CM | POA: Diagnosis present

## 2023-04-30 DIAGNOSIS — Z992 Dependence on renal dialysis: Secondary | ICD-10-CM

## 2023-04-30 DIAGNOSIS — I701 Atherosclerosis of renal artery: Secondary | ICD-10-CM | POA: Diagnosis present

## 2023-04-30 DIAGNOSIS — I2489 Other forms of acute ischemic heart disease: Secondary | ICD-10-CM | POA: Diagnosis present

## 2023-04-30 DIAGNOSIS — I161 Hypertensive emergency: Secondary | ICD-10-CM | POA: Diagnosis present

## 2023-04-30 DIAGNOSIS — E039 Hypothyroidism, unspecified: Secondary | ICD-10-CM | POA: Diagnosis present

## 2023-04-30 DIAGNOSIS — Z79899 Other long term (current) drug therapy: Secondary | ICD-10-CM

## 2023-04-30 DIAGNOSIS — E875 Hyperkalemia: Secondary | ICD-10-CM | POA: Diagnosis present

## 2023-04-30 DIAGNOSIS — Z8701 Personal history of pneumonia (recurrent): Secondary | ICD-10-CM

## 2023-04-30 DIAGNOSIS — R7989 Other specified abnormal findings of blood chemistry: Secondary | ICD-10-CM | POA: Insufficient documentation

## 2023-04-30 DIAGNOSIS — J440 Chronic obstructive pulmonary disease with acute lower respiratory infection: Secondary | ICD-10-CM | POA: Diagnosis present

## 2023-04-30 DIAGNOSIS — Z841 Family history of disorders of kidney and ureter: Secondary | ICD-10-CM

## 2023-04-30 DIAGNOSIS — G9340 Encephalopathy, unspecified: Secondary | ICD-10-CM | POA: Diagnosis present

## 2023-04-30 DIAGNOSIS — N186 End stage renal disease: Secondary | ICD-10-CM | POA: Diagnosis present

## 2023-04-30 DIAGNOSIS — R5381 Other malaise: Secondary | ICD-10-CM | POA: Diagnosis present

## 2023-04-30 DIAGNOSIS — F209 Schizophrenia, unspecified: Secondary | ICD-10-CM | POA: Diagnosis present

## 2023-04-30 DIAGNOSIS — M545 Low back pain, unspecified: Secondary | ICD-10-CM | POA: Diagnosis present

## 2023-04-30 DIAGNOSIS — J810 Acute pulmonary edema: Secondary | ICD-10-CM | POA: Diagnosis present

## 2023-04-30 DIAGNOSIS — J9621 Acute and chronic respiratory failure with hypoxia: Secondary | ICD-10-CM | POA: Diagnosis present

## 2023-04-30 DIAGNOSIS — J811 Chronic pulmonary edema: Secondary | ICD-10-CM | POA: Diagnosis present

## 2023-04-30 DIAGNOSIS — E8770 Fluid overload, unspecified: Secondary | ICD-10-CM | POA: Diagnosis present

## 2023-04-30 DIAGNOSIS — I10 Essential (primary) hypertension: Secondary | ICD-10-CM | POA: Diagnosis present

## 2023-04-30 DIAGNOSIS — I2510 Atherosclerotic heart disease of native coronary artery without angina pectoris: Secondary | ICD-10-CM | POA: Diagnosis present

## 2023-04-30 DIAGNOSIS — F259 Schizoaffective disorder, unspecified: Secondary | ICD-10-CM | POA: Diagnosis present

## 2023-04-30 LAB — COMPREHENSIVE METABOLIC PANEL
ALT: 36 U/L (ref 0–44)
AST: 33 U/L (ref 15–41)
Albumin: 3.6 g/dL (ref 3.5–5.0)
Alkaline Phosphatase: 115 U/L (ref 38–126)
Anion gap: 17 — ABNORMAL HIGH (ref 5–15)
BUN: 52 mg/dL — ABNORMAL HIGH (ref 8–23)
CO2: 30 mmol/L (ref 22–32)
Calcium: 9.2 mg/dL (ref 8.9–10.3)
Chloride: 93 mmol/L — ABNORMAL LOW (ref 98–111)
Creatinine, Ser: 7.52 mg/dL — ABNORMAL HIGH (ref 0.61–1.24)
GFR, Estimated: 8 mL/min — ABNORMAL LOW (ref >60)
Glucose, Bld: 114 mg/dL — ABNORMAL HIGH (ref 70–99)
Potassium: 5 mmol/L (ref 3.5–5.1)
Sodium: 140 mmol/L (ref 135–145)
Total Bilirubin: 1 mg/dL (ref <1.2)
Total Protein: 8.1 g/dL (ref 6.5–8.1)

## 2023-04-30 LAB — CBC
HCT: 38.6 % — ABNORMAL LOW (ref 39.0–52.0)
Hemoglobin: 13 g/dL (ref 13.0–17.0)
MCH: 34.6 pg — ABNORMAL HIGH (ref 26.0–34.0)
MCHC: 33.7 g/dL (ref 30.0–36.0)
MCV: 102.7 fL — ABNORMAL HIGH (ref 80.0–100.0)
Platelets: 240 10*3/uL (ref 150–400)
RBC: 3.76 MIL/uL — ABNORMAL LOW (ref 4.22–5.81)
RDW: 15.4 % (ref 11.5–15.5)
WBC: 7 10*3/uL (ref 4.0–10.5)
nRBC: 0 % (ref 0.0–0.2)

## 2023-04-30 LAB — BLOOD GAS, VENOUS
Acid-Base Excess: 9.2 mmol/L — ABNORMAL HIGH (ref 0.0–2.0)
Bicarbonate: 34.8 mmol/L — ABNORMAL HIGH (ref 20.0–28.0)
O2 Saturation: 54.9 %
Patient temperature: 37
pCO2, Ven: 50 mm[Hg] (ref 44–60)
pH, Ven: 7.45 — ABNORMAL HIGH (ref 7.25–7.43)
pO2, Ven: 33 mm[Hg] (ref 32–45)

## 2023-04-30 LAB — LACTIC ACID, PLASMA: Lactic Acid, Venous: 1 mmol/L (ref 0.5–1.9)

## 2023-04-30 LAB — TROPONIN I (HIGH SENSITIVITY)
Troponin I (High Sensitivity): 29 ng/L — ABNORMAL HIGH (ref <18)
Troponin I (High Sensitivity): 37 ng/L — ABNORMAL HIGH

## 2023-04-30 LAB — PROCALCITONIN: Procalcitonin: 0.28 ng/mL

## 2023-04-30 MED ORDER — FERROUS SULFATE 325 (65 FE) MG PO TABS
325.0000 mg | ORAL_TABLET | Freq: Every day | ORAL | Status: DC
  Administered 2023-05-01 – 2023-05-04 (×4): 325 mg via ORAL
  Filled 2023-04-30 (×4): qty 1

## 2023-04-30 MED ORDER — HYDRALAZINE HCL 20 MG/ML IJ SOLN
10.0000 mg | Freq: Four times a day (QID) | INTRAMUSCULAR | Status: DC | PRN
  Administered 2023-05-01 (×2): 10 mg via INTRAVENOUS
  Filled 2023-04-30 (×3): qty 1

## 2023-04-30 MED ORDER — LEVOTHYROXINE SODIUM 125 MCG PO TABS
125.0000 ug | ORAL_TABLET | Freq: Every day | ORAL | Status: DC
  Administered 2023-05-01 – 2023-05-04 (×4): 125 ug via ORAL
  Filled 2023-04-30 (×4): qty 1

## 2023-04-30 MED ORDER — HEPARIN SODIUM (PORCINE) 5000 UNIT/ML IJ SOLN
5000.0000 [IU] | Freq: Three times a day (TID) | INTRAMUSCULAR | Status: DC
  Administered 2023-04-30 – 2023-05-04 (×2): 5000 [IU] via SUBCUTANEOUS
  Filled 2023-04-30 (×6): qty 1

## 2023-04-30 MED ORDER — HYDRALAZINE HCL 20 MG/ML IJ SOLN: 5.0000 mg | Freq: Four times a day (QID) | INTRAMUSCULAR | Status: DC | PRN

## 2023-04-30 MED ORDER — IPRATROPIUM-ALBUTEROL 0.5-2.5 (3) MG/3ML IN SOLN
3.0000 mL | Freq: Once | RESPIRATORY_TRACT | Status: AC
  Administered 2023-04-30: 3 mL via RESPIRATORY_TRACT
  Filled 2023-04-30: qty 3

## 2023-04-30 MED ORDER — SODIUM CHLORIDE 0.9 % IV SOLN
500.0000 mg | INTRAVENOUS | Status: DC
  Administered 2023-04-30: 500 mg via INTRAVENOUS
  Filled 2023-04-30: qty 5

## 2023-04-30 MED ORDER — ACETAMINOPHEN 325 MG PO TABS
650.0000 mg | ORAL_TABLET | Freq: Four times a day (QID) | ORAL | Status: DC | PRN
Start: 2023-04-30 — End: 2023-05-05
  Administered 2023-05-03: 650 mg via ORAL
  Filled 2023-04-30 (×2): qty 2

## 2023-04-30 MED ORDER — LIDOCAINE 5 % EX PTCH
1.0000 | MEDICATED_PATCH | Freq: Every day | CUTANEOUS | Status: DC | PRN
  Filled 2023-04-30 (×2): qty 1

## 2023-04-30 MED ORDER — ISOSORBIDE MONONITRATE ER 60 MG PO TB24
60.0000 mg | ORAL_TABLET | Freq: Two times a day (BID) | ORAL | Status: DC
  Administered 2023-04-30: 60 mg via ORAL
  Filled 2023-04-30: qty 1

## 2023-04-30 MED ORDER — PANTOPRAZOLE SODIUM 40 MG PO TBEC
80.0000 mg | DELAYED_RELEASE_TABLET | Freq: Every day | ORAL | Status: DC
  Administered 2023-05-01 – 2023-05-04 (×4): 80 mg via ORAL
  Filled 2023-04-30 (×3): qty 2

## 2023-04-30 MED ORDER — ACETAMINOPHEN 650 MG RE SUPP: 650.0000 mg | Freq: Four times a day (QID) | RECTAL | Status: DC | PRN

## 2023-04-30 MED ORDER — VITAMIN C 500 MG PO TABS
500.0000 mg | ORAL_TABLET | Freq: Every day | ORAL | Status: DC
  Administered 2023-05-01 – 2023-05-04 (×4): 500 mg via ORAL
  Filled 2023-04-30 (×4): qty 1

## 2023-04-30 MED ORDER — IPRATROPIUM-ALBUTEROL 0.5-2.5 (3) MG/3ML IN SOLN
3.0000 mL | Freq: Three times a day (TID) | RESPIRATORY_TRACT | Status: AC
  Administered 2023-04-30 – 2023-05-01 (×2): 3 mL via RESPIRATORY_TRACT
  Filled 2023-04-30: qty 3

## 2023-04-30 MED ORDER — NAPROXEN 500 MG PO TABS
500.0000 mg | ORAL_TABLET | Freq: Two times a day (BID) | ORAL | Status: DC | PRN
  Administered 2023-04-30: 500 mg via ORAL
  Filled 2023-04-30: qty 1

## 2023-04-30 MED ORDER — TRIHEXYPHENIDYL HCL 2 MG PO TABS
2.0000 mg | ORAL_TABLET | Freq: Two times a day (BID) | ORAL | Status: DC
  Administered 2023-04-30 – 2023-05-04 (×7): 2 mg via ORAL
  Filled 2023-04-30 (×10): qty 1

## 2023-04-30 MED ORDER — HYDRALAZINE HCL 20 MG/ML IJ SOLN
10.0000 mg | Freq: Four times a day (QID) | INTRAMUSCULAR | Status: DC | PRN
  Administered 2023-04-30: 10 mg via INTRAVENOUS
  Filled 2023-04-30: qty 1

## 2023-04-30 MED ORDER — CALCIUM ACETATE (PHOS BINDER) 667 MG PO CAPS
1334.0000 mg | ORAL_CAPSULE | Freq: Three times a day (TID) | ORAL | Status: DC
  Administered 2023-04-30 – 2023-05-04 (×10): 1334 mg via ORAL
  Filled 2023-04-30 (×11): qty 2

## 2023-04-30 MED ORDER — FLUPHENAZINE HCL 2.5 MG PO TABS
2.5000 mg | ORAL_TABLET | Freq: Every morning | ORAL | Status: DC
  Filled 2023-04-30: qty 1

## 2023-04-30 MED ORDER — ONDANSETRON HCL 4 MG/2ML IJ SOLN: 4.0000 mg | Freq: Four times a day (QID) | INTRAMUSCULAR | Status: DC | PRN

## 2023-04-30 MED ORDER — MOMETASONE FURO-FORMOTEROL FUM 200-5 MCG/ACT IN AERO
2.0000 | INHALATION_SPRAY | Freq: Two times a day (BID) | RESPIRATORY_TRACT | Status: DC
  Administered 2023-04-30 – 2023-05-04 (×4): 2 via RESPIRATORY_TRACT
  Filled 2023-04-30 (×4): qty 8.8

## 2023-04-30 MED ORDER — ONDANSETRON HCL 4 MG PO TABS: 4.0000 mg | ORAL_TABLET | Freq: Four times a day (QID) | ORAL | Status: DC | PRN

## 2023-04-30 MED ORDER — SODIUM CHLORIDE 0.9 % IV SOLN
2.0000 g | INTRAVENOUS | Status: DC
  Administered 2023-04-30 – 2023-05-01 (×2): 2 g via INTRAVENOUS
  Filled 2023-04-30 (×2): qty 20

## 2023-04-30 MED ORDER — LABETALOL HCL 5 MG/ML IV SOLN
20.0000 mg | Freq: Once | INTRAVENOUS | Status: AC
  Administered 2023-04-30: 20 mg via INTRAVENOUS
  Filled 2023-04-30: qty 4

## 2023-04-30 MED ORDER — CHLORHEXIDINE GLUCONATE CLOTH 2 % EX PADS
6.0000 | MEDICATED_PAD | Freq: Every day | CUTANEOUS | Status: DC
  Administered 2023-05-01 – 2023-05-03 (×2): 6 via TOPICAL

## 2023-04-30 MED ORDER — CARBAMAZEPINE 200 MG PO TABS
1000.0000 mg | ORAL_TABLET | Freq: Every morning | ORAL | Status: DC
  Administered 2023-05-01 – 2023-05-04 (×4): 1000 mg via ORAL
  Filled 2023-04-30 (×4): qty 5

## 2023-04-30 MED ORDER — VITAMIN D 25 MCG (1000 UNIT) PO TABS
1000.0000 [IU] | ORAL_TABLET | Freq: Every morning | ORAL | Status: DC
  Administered 2023-05-01 – 2023-05-04 (×4): 1000 [IU] via ORAL
  Filled 2023-04-30 (×4): qty 1

## 2023-04-30 MED ORDER — POLYETHYLENE GLYCOL 3350 17 G PO PACK: 17.0000 g | PACK | Freq: Every day | ORAL | Status: DC | PRN

## 2023-04-30 MED ORDER — ZINC SULFATE 220 (50 ZN) MG PO CAPS
220.0000 mg | ORAL_CAPSULE | Freq: Every day | ORAL | Status: DC
  Administered 2023-05-01 – 2023-05-04 (×4): 220 mg via ORAL
  Filled 2023-04-30 (×4): qty 1

## 2023-04-30 MED ORDER — GUAIFENESIN 100 MG/5ML PO LIQD
5.0000 mL | ORAL | Status: AC | PRN
  Administered 2023-04-30 – 2023-05-03 (×3): 5 mL via ORAL
  Filled 2023-04-30 (×3): qty 10

## 2023-04-30 NOTE — Assessment & Plan Note (Addendum)
With productive cough Etiology workup in progress differentials include community-acquired pneumonia versus asymmetric, pulmonary edema Check procalcitonin on admission, if positive will initiate antibiotic for community-acquired pneumonia treatment DuoNebs 3 times daily, 3 treatments ordered Strict I's and O's Maintain SpO2 greater than 92% Continuous pulse oximetry Admit to telemetry cardiac, inpatient with cardiac monitoring  Addendum: Procalcitonin is mildly elevated, will check a lactic acid.  Added azithromycin 500 mg IV daily and ceftriaxone 2 g IV daily he to complete 5-day course.  Patient has chronic medical problems with physical deconditioning. Chest physiotherapy with percussor and manual, 3 times daily, for occurrences ordered on admission. Insulin spirometry, flutter valve every 2 hours while awake

## 2023-04-30 NOTE — Assessment & Plan Note (Addendum)
Baseline, 'for a while' per patient

## 2023-04-30 NOTE — Assessment & Plan Note (Signed)
Suspect secondary to commune acquired pneumonia complicated by pulmonary edema in setting of end-stage renal disease on hemodialysis Treat per above

## 2023-04-30 NOTE — ED Provider Notes (Signed)
[facility] Provider Note    Event Date/Time   First MD Initiated Contact with Patient 04/30/23 1335     (approximate)   History   Altered Mental Status   HPI  Todd Brown is a 62 y.o. male history of COPD, end-stage renal disease, schizophrenia who presents with shortness of breath, agitation.  Reportedly patient was receiving dialysis this morning, became agitated and so was sent to the emergency department.  Only received 54 minutes of dialysis this morning     Physical Exam   Triage Vital Signs: ED Triage Vitals  Encounter Vitals Group     BP 04/30/23 1327 (!) 207/143     Systolic BP Percentile --      Diastolic BP Percentile --      Pulse Rate 04/30/23 1327 89     Resp 04/30/23 1327 20     Temp 04/30/23 1354 97.9 F (36.6 C)     Temp Source 04/30/23 1354 Axillary     SpO2 04/30/23 1327 (!) 78 %     Weight 04/30/23 1326 79.4 kg (175 lb)     Height 04/30/23 1326 1.88 m (6\' 2" )     Head Circumference --      Peak Flow --      Pain Score --      Pain Loc --      Pain Education --      Exclude from Growth Chart --     Most recent vital signs: Vitals:   04/30/23 1400 04/30/23 1430  BP: (!) 191/127 (!) 188/95  Pulse: 83 83  Resp: 15 20  Temp:    SpO2: 94%      General: Awake, no distress.  Cooperative with staff CV:  Good peripheral perfusion.  Resp:  Mild tachypnea, bibasilar rales Abd:  No distention.  Other:  Found to be hypoxic in the 80s on room air   ED Results / Procedures / Treatments   Labs (all labs ordered are listed, but only abnormal results are displayed) Labs Reviewed  CBC - Abnormal; Notable for the following components:      Result Value   RBC 3.76 (*)    HCT 38.6 (*)    MCV 102.7 (*)    MCH 34.6 (*)    All other components within normal limits  COMPREHENSIVE METABOLIC PANEL - Abnormal; Notable for the following components:   Chloride 93 (*)    Glucose, Bld 114 (*)    BUN 52 (*)    Creatinine,  Ser 7.52 (*)    GFR, Estimated 8 (*)    Anion gap 17 (*)    All other components within normal limits  BLOOD GAS, VENOUS - Abnormal; Notable for the following components:   pH, Ven 7.45 (*)    Bicarbonate 34.8 (*)    Acid-Base Excess 9.2 (*)    All other components within normal limits  TROPONIN I (HIGH SENSITIVITY) - Abnormal; Notable for the following components:   Troponin I (High Sensitivity) 29 (*)    All other components within normal limits  PROCALCITONIN     EKG  ED ECG REPORT I, Jene Every, the attending physician, personally viewed and interpreted this ECG.  Date: 04/30/2023  Rhythm: normal sinus rhythm QRS Axis: normal Intervals: normal ST/T Wave abnormalities: normal Narrative Interpretation: no evidence of acute ischemia    RADIOLOGY Chest x-ray consistent with likely asymmetric edema    PROCEDURES:  Critical Care performed: yes CRITICAL CARE Performed by:  Jene Every   Total critical care time: 30 minutes  Critical care time was exclusive of separately billable procedures and treating other patients.  Critical care was necessary to treat or prevent imminent or life-threatening deterioration.  Critical care was time spent personally by me on the following activities: development of treatment plan with patient and/or surrogate as well as nursing, discussions with consultants, evaluation of patient's response to treatment, examination of patient, obtaining history from patient or surrogate, ordering and performing treatments and interventions, ordering and review of laboratory studies, ordering and review of radiographic studies, pulse oximetry and re-evaluation of patient's condition.   Procedures   MEDICATIONS ORDERED IN ED: Medications  Chlorhexidine Gluconate Cloth 2 % PADS 6 each (has no administration in time range)  acetaminophen (TYLENOL) tablet 650 mg (has no administration in time range)    Or  acetaminophen (TYLENOL) suppository  650 mg (has no administration in time range)  ondansetron (ZOFRAN) tablet 4 mg (has no administration in time range)    Or  ondansetron (ZOFRAN) injection 4 mg (has no administration in time range)  heparin injection 5,000 Units (has no administration in time range)  ipratropium-albuterol (DUONEB) 0.5-2.5 (3) MG/3ML nebulizer solution 3 mL (has no administration in time range)  ipratropium-albuterol (DUONEB) 0.5-2.5 (3) MG/3ML nebulizer solution 3 mL (3 mLs Nebulization Given 04/30/23 1352)     IMPRESSION / MDM / ASSESSMENT AND PLAN / ED COURSE  I reviewed the triage vital signs and the nursing notes. Patient's presentation is most consistent with acute presentation with potential threat to life or bodily function.  Patient presents with agitation, shortness of breath, found to be hypoxic as detailed above.  Strongly suspicious for fluid overload/edema  Started on nasal cannula oxygen with improvement, chest x-ray suggestive of asymmetric edema  Lab work reviewed, pCO2 is normal, patient with chronically elevated troponin, white blood cell count is normal  Have discussed with Dr. Thedore Mins of nephrology who will arrange for dialysis  Consulted with the hospitalist team for admission        FINAL CLINICAL IMPRESSION(S) / ED DIAGNOSES   Final diagnoses:  Acute hypoxic respiratory failure (HCC)     Rx / DC Orders   ED Discharge Orders     None        Note:  This document was prepared using Dragon voice recognition software and may include unintentional dictation errors.   Jene Every, MD 04/30/23 [phone]

## 2023-04-30 NOTE — ED Notes (Signed)
Hospitalist at bedside 

## 2023-04-30 NOTE — ED Notes (Signed)
Unable to place pt on cardiac monitor at this time. Pt combative and yelling.

## 2023-04-30 NOTE — ED Triage Notes (Signed)
pt to ED via [facility] from dialysis after 54 minutes of treatment, pulled off  pt mumbling and yelling Pt was combative at dialysis Slurred speech is his baseline  pt is a ward of the state pt combative and noncompliant with EMS  223/126, then 196/119 BP at dialysis HR 98 EMS could not get other VS because combative  Pt refusing EKG, combative. Unable to perform EKG at this time. SPO2 is 81% on RA. Placed on 5L [place]  Pt yelling at RN with mention of bloodwork

## 2023-04-30 NOTE — Assessment & Plan Note (Signed)
Nephrology service has been consulted by EDP, Dr. Thedore Mins is aware

## 2023-04-30 NOTE — Assessment & Plan Note (Signed)
Guaifenesin 5 mL p.o. every 4 hours as needed for cough and to loosen phlegm, 3 days ordered

## 2023-04-30 NOTE — Assessment & Plan Note (Signed)
Fall precautions.  

## 2023-04-30 NOTE — Assessment & Plan Note (Addendum)
Patient states his pain has been there for a long time PDMP reviewed, no active pain medication ordered at this time Home naproxen 500 mg p.o. twice daily as needed for mild pain resumed on admission Lidocaine patch, every 12 hours daily as needed for pain ordered on admission

## 2023-04-30 NOTE — Assessment & Plan Note (Addendum)
With schizoaffective disorder Fluphenazine 2.5 mg every morning resumed Carbamazepine 1000 mg every morning and trihexyphenidyl 2 mg p.o. twice daily were resumed on admission

## 2023-04-30 NOTE — ED Notes (Signed)
EDP at bedside attempting to explain need for bloodwork. Pt stating that dialysis nurses are "on probation".

## 2023-04-30 NOTE — Assessment & Plan Note (Signed)
Strict I's and O's Nephrology has been consulted for dialysis

## 2023-04-30 NOTE — ED Notes (Signed)
Bed alarm placed.  

## 2023-04-30 NOTE — Assessment & Plan Note (Signed)
-   Levothyroxine 125 mcg daily resumed

## 2023-04-30 NOTE — Hospital Course (Addendum)
[name] is a 62 year old male with history of end-stage renal disease on hemodialysis, schizophrenia, hypertension, COPD, who presents to the emergency department for chief concerns of agitation, and hypoxic respiratory failure at hemodialysis center.  Per ED report, patient SpO2 was in the 80s on room air at dialysis.  Patient received 54 minutes of hemodialysis with 735 mm of fluid removal.  Patient is a ward of the state.  Vitals in the ED showed temperature 97.9, respiration rate of 15, heart rate 83, blood pressure 191/127, SpO2 of 94% on 2 L nasal cannula  Serum sodium is 140, potassium 5.0, chloride 93, bicarb 30, BUN of 52, serum creatinine of 7.52, EGFR of 8, nonfasting blood glucose 114, WBC 7.0, hemoglobin 13, platelets of 240.  High-sensitivity troponin is 29.  ED treatment: DuoNebs one-time treatment.  Patient was placed on 2 L nasal cannula.  EDP consulted nephrology for continuation of hemodialysis treatments.

## 2023-04-30 NOTE — Assessment & Plan Note (Addendum)
Hydralazine 10 mg IV every 6 hours as needed for SBP greater 170, 5 days ordered

## 2023-04-30 NOTE — Assessment & Plan Note (Signed)
Suspect secondary to demand ischemia in setting of acute hypoxic respiratory failure Will check a second high since troponin to ensure downtrending Treat per above

## 2023-04-30 NOTE — H&P (Addendum)
History and Physical   Todd Brown WUX:[id number] DOB: [birth year] DOA: 04/30/2023  PCP: Smiley Houseman, NP  Outpatient Specialists: Dr. Gilda Crease, vascular surgery Patient coming from: Hemodialysis center via EMS  I have personally briefly reviewed patient's old medical records in [facility] EMR.  Chief Concern: Encephalopathy, respiratory failure  HPI: Todd Brown is a 62 year old male with history of end-stage renal disease on hemodialysis, schizophrenia, hypertension, COPD, who presents to the emergency department for chief concerns of agitation, and hypoxic respiratory failure at hemodialysis center.  Per ED report, patient SpO2 was in the 80s on room air at dialysis.  Patient received 54 minutes of hemodialysis with 735 mm of fluid removal.  Patient is a ward of the state.  Vitals in the ED showed temperature 97.9, respiration rate of 15, heart rate 83, blood pressure 191/127, SpO2 of 94% on 2 L nasal cannula  Serum sodium is 140, potassium 5.0, chloride 93, bicarb 30, BUN of 52, serum creatinine of 7.52, EGFR of 8, nonfasting blood glucose 114, WBC 7.0, hemoglobin 13, platelets of 240.  High-sensitivity troponin is 29.  ED treatment: DuoNebs one-time treatment.  Patient was placed on 2 L nasal cannula.  EDP consulted nephrology for continuation of hemodialysis treatments. -------------------------------------- At bedside, patient is able to tell me his first and last name, his age is 62 years old and he is at [facility].  He does not know the year and states that he does not keep up with that stuff because he is blind. He was able to tell me it is end of November.   He has productive cough. He was not able to tell me how long the productive cough has been happening. He is not able to cough the sputum, due to difficulty getting it up. He denies chest pain, abdominal pain, diarrhea, nausea, vomiting. He states he is here in the hospital to get dialysis.    Social history: He lives in a nursing home. He smokes about 4 cigarettes per day. He denies etoh and recreational drug use. He is disabled.   ROS: Constitutional: no weight change, no fever ENT/Mouth: no sore throat, no rhinorrhea Eyes: no eye pain, no vision changes Cardiovascular: no chest pain, no dyspnea,  no edema, no palpitations Respiratory: + cough, + sputum, no wheezing Gastrointestinal: no nausea, no vomiting, no diarrhea, no constipation Genitourinary: no urinary incontinence, no dysuria, no hematuria Musculoskeletal: no arthralgias, no myalgias Skin: no skin lesions, no pruritus, Neuro: + weakness, no loss of consciousness, no syncope Psych: no anxiety, no depression, + decrease appetite Heme/Lymph: no bruising, no bleeding  ED Course: Discussed with EDP, patient requiring hospitalization for chief concerns of acute hypoxic respiratory failure.  Assessment/Plan  Principal Problem:   Acute hypoxemic respiratory failure (HCC) Active Problems:   Acute pulmonary edema (HCC)   Acute encephalopathy   ESRD on hemodialysis (HCC)   CAD (coronary artery disease)   Physical deconditioning   Hypothyroidism   Schizophrenia (HCC)   Dialysis patient (HCC)   Hypertension   GERD (gastroesophageal reflux disease)   Blindness of both eyes   Elevated troponin   Low back pain   Productive cough   Assessment and Plan:  * Acute hypoxemic respiratory failure (HCC) With productive cough Etiology workup in progress differentials include community-acquired pneumonia versus asymmetric, pulmonary edema Check procalcitonin on admission, if positive will initiate antibiotic for community-acquired pneumonia treatment DuoNebs 3 times daily, 3 treatments ordered Strict I's and O's Maintain SpO2 greater than 92%  Continuous pulse oximetry Admit to telemetry cardiac, inpatient with cardiac monitoring  Addendum: Procalcitonin is mildly elevated, will check a lactic acid.  Added  azithromycin 500 mg IV daily and ceftriaxone 2 g IV daily he to complete 5-day course.  Patient has chronic medical problems with physical deconditioning. Chest physiotherapy with percussor and manual, 3 times daily, for occurrences ordered on admission. Insulin spirometry, flutter valve every 2 hours while awake  Acute pulmonary edema (HCC) Strict I's and O's Nephrology has been consulted for dialysis  Acute encephalopathy Suspect secondary to commune acquired pneumonia complicated by pulmonary edema in setting of end-stage renal disease on hemodialysis Treat per above  ESRD on hemodialysis [facility]) Nephrology service has been consulted by EDP, Dr. Thedore Mins is aware  Hypothyroidism Levothyroxine 125 mcg daily resumed  Physical deconditioning Fall precautions  Schizophrenia (HCC) With schizoaffective disorder Fluphenazine 2.5 mg every morning resumed Carbamazepine 1000 mg every morning and trihexyphenidyl 2 mg p.o. twice daily were resumed on admission  Productive cough Guaifenesin 5 mL p.o. every 4 hours as needed for cough and to loosen phlegm, 3 days ordered  Low back pain Patient states his pain has been there for a long time PDMP reviewed, no active pain medication ordered at this time Home naproxen 500 mg p.o. twice daily as needed for mild pain resumed on admission Lidocaine patch, every 12 hours daily as needed for pain ordered on admission  Elevated troponin Suspect secondary to demand ischemia in setting of acute hypoxic respiratory failure Will check a second high since troponin to ensure downtrending Treat per above  Blindness of both eyes Baseline, 'for a while' per patient  Hypertension Hydralazine 10 mg IV every 6 hours as needed for SBP greater 170, 5 days ordered  Chart reviewed.   DVT prophylaxis: Heparin 5000 units subcutaneous Code Status: Full code Diet: Renal diet Family Communication: no, patient states he has no family Disposition Plan: Pending  clinical course Consults called: Nephrology Admission status: Telemetry cardiac, inpatient  Past Medical History:  Diagnosis Date   Anemia    Arthritis    Chronic respiratory failure (HCC)    COPD (chronic obstructive pulmonary disease) (HCC)    Dialysis patient (HCC)    Mon. 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   ESRD (end stage renal disease) (HCC)    ESRD (end stage renal disease) on dialysis (HCC)    Hyperlipidemia    Hypertension    Malignant hypertension    Pneumonia 2016   Renal artery stenosis (HCC)    Schizophrenia (HCC)    Past Surgical History:  Procedure Laterality Date   A/V FISTULAGRAM N/A 08/06/2016   Procedure: A/V Fistulagram;  Surgeon: Annice Needy, MD;  Location: [facility] INVASIVE CV LAB;  Service: Cardiovascular;  Laterality: N/A;   A/V FISTULAGRAM N/A 02/14/2021   Procedure: A/V FISTULAGRAM poss Perm Cath Insertion;  Surgeon: Renford Dills, MD;  Location: [facility] INVASIVE CV LAB;  Service: Cardiovascular;  Laterality: N/A;   A/V FISTULAGRAM Left 08/26/2021   Procedure: A/V Fistulagram;  Surgeon: Annice Needy, MD;  Location: [facility] INVASIVE CV LAB;  Service: Cardiovascular;  Laterality: Left;   A/V SHUNT INTERVENTION N/A 08/06/2016   Procedure: A/V Shunt Intervention;  Surgeon: Annice Needy, MD;  Location: [facility] INVASIVE CV LAB;  Service: Cardiovascular;  Laterality: N/A;   AV FISTULA PLACEMENT Left 12/26/2014   Procedure: ARTERIOVENOUS (AV) FISTULA CREATION;  Surgeon: Annice Needy, MD;  Location: [facility]  ORS;  Service: Vascular;  Laterality: Left;   AV FISTULA PLACEMENT     ESOPHAGOGASTRODUODENOSCOPY (EGD) WITH PROPOFOL N/A 05/06/2017   Procedure: ESOPHAGOGASTRODUODENOSCOPY (EGD) WITH PROPOFOL;  Surgeon: Wyline Mood, MD;  Location: [facility];  Service: Gastroenterology;  Laterality: N/A;   INSERTION OF DIALYSIS CATHETER Right    PERIPHERAL VASCULAR CATHETERIZATION N/A 10/22/2014   Procedure: Dialysis/Perma  Catheter Insertion;  Surgeon: Annice Needy, MD;  Location: [facility] INVASIVE CV LAB;  Service: Cardiovascular;  Laterality: N/A;   PERIPHERAL VASCULAR CATHETERIZATION N/A 02/28/2015   Procedure: Dialysis/Perma Catheter Removal;  Surgeon: Annice Needy, MD;  Location: [facility] INVASIVE CV LAB;  Service: Cardiovascular;  Laterality: N/A;   Repair fx left lower leg     yrs ago (age 52)   REVISON OF ARTERIOVENOUS FISTULA Left 03/27/2022   Procedure: REVISON OF ARTERIOVENOUS FISTULA (JUMP GRAFT);  Surgeon: Renford Dills, MD;  Location: [facility] ORS;  Service: Vascular;  Laterality: Left;   TEE WITHOUT CARDIOVERSION N/A 09/21/2022   Procedure: TRANSESOPHAGEAL ECHOCARDIOGRAM;  Surgeon: Debbe Odea, MD;  Location: [facility] ORS;  Service: Cardiovascular;  Laterality: N/A;   TONSILLECTOMY     Social History:  reports that he has never smoked. He has never been exposed to tobacco smoke. He has never used smokeless tobacco. He reports that he does not currently use alcohol. He reports that he does not currently use drugs after having used the following drugs: Marijuana.  Allergies  Allergen Reactions   Chlorpromazine Other (See Comments)    Reaction:  Unknown , pt states it makes him feel real bad Reaction:  Unknown , pt states it makes him feel real bad    Family History  Problem Relation Age of Onset   Kidney disease Brother    Diabetes Neg Hx    Family history: Family history reviewed and not pertinent.  Prior to Admission medications   Medication Sig Start Date End Date Taking? Authorizing Provider  albuterol (VENTOLIN HFA) 108 (90 Base) MCG/ACT inhaler Inhale 1-2 puffs into the lungs every 4 (four) hours as needed for wheezing or shortness of breath.    [provider]  amoxicillin-clavulanate (AUGMENTIN) 500-125 MG tablet Take 1 tablet by mouth every evening. 11/07/22   Alford Highland, MD  ascorbic acid (VITAMIN C) 500 MG tablet Take 500 mg by mouth daily.    [provider]  b  complex-vitamin c-folic acid (NEPHRO-VITE) 0.8 MG TABS tablet Take 1 tablet by mouth at bedtime.    [provider]  calcium acetate (PHOSLO) 667 MG capsule Take 1,334 mg by mouth 3 (three) times daily with meals. 0800/1400/2000    [provider]  carbamazepine (TEGRETOL) 200 MG tablet Take 1,000 mg by mouth every morning.    [provider]  cholecalciferol (VITAMIN D) 1000 UNITS tablet Take 1,000 Units by mouth in the morning.    [provider]  doxazosin (CARDURA) 4 MG tablet Take 1 tablet (4 mg total) by mouth at bedtime. 02/21/22   Marrion Coy, MD  ferrous sulfate 325 (65 FE) MG tablet Take 1 tablet (325 mg total) by mouth daily. 05/19/15   Enedina Finner, MD  fluPHENAZine (PROLIXIN) 2.5 MG tablet Take 2.5 mg by mouth in the morning.    [provider]  Fluticasone-Salmeterol (ADVAIR) 250-50 MCG/DOSE AEPB Inhale 1 puff into the lungs 2 (two) times daily.    [provider]  isosorbide mononitrate (IMDUR) 60 MG 24 hr tablet Take 60 mg by mouth 2 (two) times daily.  [provider]  levothyroxine (SYNTHROID) 125 MCG tablet Take 1 tablet (125 mcg total) by mouth daily. 11/08/22 12/08/22  Alford Highland, MD  naproxen (NAPROSYN) 500 MG tablet Take 500 mg by mouth 2 (two) times daily as needed for mild pain. 10/10/22   [provider]  omeprazole (PRILOSEC) 40 MG capsule Take 40 mg by mouth every morning.    [provider]  polyethylene glycol (MIRALAX / GLYCOLAX) 17 g packet Take 17 g by mouth daily as needed for mild constipation.    [provider]  promethazine-dextromethorphan (PROMETHAZINE-DM) 6.25-15 MG/5ML syrup Take 5 mLs by mouth every 6 (six) hours as needed for cough.    [provider]  trihexyphenidyl (ARTANE) 2 MG tablet Take 2 mg by mouth 2 (two) times daily.    [provider]  zinc sulfate 220 (50 Zn) MG capsule Take 220 mg by mouth daily.    [provider]   Physical  Exam: Vitals:   04/30/23 1354 04/30/23 1400 04/30/23 1430 04/30/23 1500  BP:  (!) 191/127 (!) 188/95 (!) 196/100  Pulse:  83 83 88  Resp:  15 20 18   Temp: 97.9 F (36.6 C)     TempSrc: Axillary     SpO2:  94%  92%  Weight:      Height:       Constitutional: appears frail, older than chronological age, chronically ill, NAD, calm Eyes: PERRL, lids and conjunctivae normal ENMT: Mucous membranes are moist. Posterior pharynx clear of any exudate or lesions. Age-appropriate dentition. Hearing appropriate Neck: normal, supple, no masses, no thyromegaly Respiratory: clear to auscultation bilaterally, no wheezing, no crackles. Normal respiratory effort. No accessory muscle use.  Cardiovascular: Regular rate and rhythm, no murmurs / rubs / gallops.  Bilateral lower extremity trace edema. 2+ pedal pulses. No carotid bruits.  Abdomen: no tenderness, no masses palpated, no hepatosplenomegaly. Bowel sounds positive.  Musculoskeletal: no clubbing / cyanosis. No joint deformity upper and lower extremities. Good ROM, no contractures, no atrophy. Normal muscle tone.  Skin: no rashes, lesions, ulcers. No induration Neurologic: Sensation intact. Strength 5/5 in all 4.  Psychiatric: Normal judgment and insight. Alert and oriented x 3. Normal mood.   EKG: independently reviewed, showing sinus rhythm with rate of 89, QTc 496  Chest x-ray on Admission: I personally reviewed and I agree with radiologist reading as below.  DG Chest Port 1 View  Result Date: 04/30/2023 CLINICAL DATA:  Shortness of breath EXAM: PORTABLE CHEST 1 VIEW COMPARISON:  Chest radiograph dated 11/03/2022 FINDINGS: Normal lung volumes. Asymmetric right perihilar opacity. Mild bilateral interstitial opacities. Dense left retrocardiac opacity. Trace blunting of the bilateral costophrenic angles. No pneumothorax. Similar enlarged cardiomediastinal silhouette. No acute osseous abnormality. IMPRESSION: 1. Asymmetric right perihilar opacity,  which may represent asymmetric pulmonary edema or infection. 2. Dense left retrocardiac opacity, which may represent atelectasis, aspiration, or pneumonia. 3. Trace blunting of the bilateral costophrenic angles, which may represent trace pleural effusions. Electronically Signed   By: Agustin Cree M.D.   On: 04/30/2023 13:57    Labs on Admission: I have personally reviewed following labs  CBC: Recent Labs  Lab 04/30/23 1338  WBC 7.0  HGB 13.0  HCT 38.6*  MCV 102.7*  PLT 240   Basic Metabolic Panel: Recent Labs  Lab 04/30/23 1338  NA 140  K 5.0  CL 93*  CO2 30  GLUCOSE 114*  BUN 52*  CREATININE 7.52*  CALCIUM 9.2   GFR: Estimated Creatinine Clearance: 11.4 mL/min (  A) (by C-G formula based on SCr of 7.52 mg/dL (H)).  Liver Function Tests: Recent Labs  Lab 04/30/23 1338  AST 33  ALT 36  ALKPHOS 115  BILITOT 1.0  PROT 8.1  ALBUMIN 3.6   Urine analysis:    Component Value Date/Time   COLORURINE YELLOW (A) 05/17/2015 0012   APPEARANCEUR HAZY (A) 05/17/2015 0012   LABSPEC 1.009 05/17/2015 0012   PHURINE 7.0 05/17/2015 0012   GLUCOSEU NEGATIVE 05/17/2015 0012   HGBUR 1+ (A) 05/17/2015 0012   BILIRUBINUR NEGATIVE 05/17/2015 0012   KETONESUR NEGATIVE 05/17/2015 0012   PROTEINUR 30 (A) 05/17/2015 0012   NITRITE NEGATIVE 05/17/2015 0012   LEUKOCYTESUR 3+ (A) 05/17/2015 0012   This document was prepared using Dragon Voice Recognition software and may include unintentional dictation errors.  Dr. Sedalia Muta [facility]  If 7PM-7AM, please contact overnight-coverage provider If 7AM-7PM, please contact day attending provider [email or web address]  04/30/2023, 4:28 PM

## 2023-05-01 DIAGNOSIS — J9601 Acute respiratory failure with hypoxia: Secondary | ICD-10-CM | POA: Diagnosis not present

## 2023-05-01 LAB — HEPATITIS B SURFACE ANTIGEN: Hepatitis B Surface Ag: NONREACTIVE

## 2023-05-01 LAB — CBC
HCT: 34.3 % — ABNORMAL LOW (ref 39.0–52.0)
Hemoglobin: 11.5 g/dL — ABNORMAL LOW (ref 13.0–17.0)
MCH: 34.1 pg — ABNORMAL HIGH (ref 26.0–34.0)
MCHC: 33.5 g/dL (ref 30.0–36.0)
MCV: 101.8 fL — ABNORMAL HIGH (ref 80.0–100.0)
Platelets: 209 10*3/uL (ref 150–400)
RBC: 3.37 MIL/uL — ABNORMAL LOW (ref 4.22–5.81)
RDW: 15.2 % (ref 11.5–15.5)
WBC: 6.6 10*3/uL (ref 4.0–10.5)
nRBC: 0 % (ref 0.0–0.2)

## 2023-05-01 LAB — BASIC METABOLIC PANEL
Anion gap: 15 (ref 5–15)
BUN: 61 mg/dL — ABNORMAL HIGH (ref 8–23)
CO2: 27 mmol/L (ref 22–32)
Calcium: 9.2 mg/dL (ref 8.9–10.3)
Chloride: 97 mmol/L — ABNORMAL LOW (ref 98–111)
Creatinine, Ser: 8.49 mg/dL — ABNORMAL HIGH (ref 0.61–1.24)
GFR, Estimated: 7 mL/min — ABNORMAL LOW (ref >60)
Glucose, Bld: 70 mg/dL (ref 70–99)
Potassium: 5.9 mmol/L — ABNORMAL HIGH (ref 3.5–5.1)
Sodium: 139 mmol/L (ref 135–145)

## 2023-05-01 LAB — PROCALCITONIN: Procalcitonin: 0.28 ng/mL

## 2023-05-01 LAB — MRSA NEXT GEN BY PCR, NASAL: MRSA by PCR Next Gen: NOT DETECTED

## 2023-05-01 MED ORDER — ALTEPLASE 2 MG IJ SOLR: 2.0000 mg | Freq: Once | INTRAMUSCULAR | Status: DC | PRN

## 2023-05-01 MED ORDER — HEPARIN SODIUM (PORCINE) 1000 UNIT/ML IJ SOLN
INTRAMUSCULAR | Status: AC
Start: 2023-05-01 — End: ?
  Filled 2023-05-01: qty 10

## 2023-05-01 MED ORDER — AZITHROMYCIN 250 MG PO TABS
500.0000 mg | ORAL_TABLET | Freq: Every day | ORAL | Status: DC
  Administered 2023-05-01: 500 mg via ORAL
  Filled 2023-05-01: qty 2

## 2023-05-01 MED ORDER — ORAL CARE MOUTH RINSE: 15.0000 mL | OROMUCOSAL | Status: DC | PRN

## 2023-05-01 MED ORDER — HEPARIN SODIUM (PORCINE) 1000 UNIT/ML DIALYSIS: 1000.0000 [IU] | INTRAMUSCULAR | Status: DC | PRN

## 2023-05-01 MED ORDER — HEPARIN SODIUM (PORCINE) 1000 UNIT/ML DIALYSIS
25.0000 [IU]/kg | INTRAMUSCULAR | Status: DC | PRN
  Administered 2023-05-01: 2000 [IU] via INTRAVENOUS_CENTRAL
  Filled 2023-05-01: qty 2

## 2023-05-01 MED ORDER — ISOSORBIDE MONONITRATE ER 30 MG PO TB24
60.0000 mg | ORAL_TABLET | Freq: Two times a day (BID) | ORAL | Status: DC
  Administered 2023-05-01 – 2023-05-04 (×6): 60 mg via ORAL
  Filled 2023-05-01 (×3): qty 1
  Filled 2023-05-01 (×4): qty 2
  Filled 2023-05-01: qty 1

## 2023-05-01 MED ORDER — HYDRALAZINE HCL 50 MG PO TABS
50.0000 mg | ORAL_TABLET | Freq: Three times a day (TID) | ORAL | Status: DC
  Administered 2023-05-01 – 2023-05-02 (×2): 50 mg via ORAL
  Filled 2023-05-01 (×2): qty 1

## 2023-05-01 MED ORDER — FLUPHENAZINE HCL 5 MG PO TABS
2.5000 mg | ORAL_TABLET | Freq: Every day | ORAL | Status: DC
  Administered 2023-05-01 – 2023-05-04 (×4): 2.5 mg via ORAL
  Filled 2023-05-01 (×5): qty 1

## 2023-05-01 NOTE — Plan of Care (Signed)

## 2023-05-01 NOTE — Progress Notes (Signed)
[facility]  ROUNDING NOTE   Subjective:   Todd Brown  is a 62 year old male with past medical conditions including COPD, hypertension, DVT, schizophrenia, and end-stage renal disease on hemodialysis.  Patient presents to the emergency department with combative behavior at outpatient dialysis clinic.  Patient has been admitted for Acute hypoxemic respiratory failure (HCC) [J96.01] Acute hypoxic respiratory failure (HCC) [J96.01]  Patient is known to our practice and receives outpatient dialysis treatments at [facility] on a MWF schedule, supervised by Dr. Cherylann Ratel.  Patient was able to complete 54 minutes of treatment yesterday before becoming combative.  Patient seen and evaluated during treatment today   HEMODIALYSIS FLOWSHEET:  Blood Flow Rate (mL/min): 400 mL/min Arterial Pressure (mmHg): -150 mmHg Venous Pressure (mmHg): 280 mmHg TMP (mmHg): 10 mmHg Ultrafiltration Rate (mL/min): 1257 mL/min Dialysate Flow Rate (mL/min): 300 ml/min Bolus Amount (mL): 300 mL  Somnolent at this time.  Blood pressure elevated which is not uncommon when patient is fluid overloaded.  Currently on 3 L nasal cannula.  Labs today concerning for potassium 5.9, BUN 61, creatinine 8.49 with GFR 7, and hemoglobin 11.5.  Troponin is unremarkable.  Chest x-ray shows a right perihilar opacity, possibly pulmonary edema versus infection.  Also a left retrocardiac opacity which could represent aspiration or pneumonia.  We have been consulted to provide dialysis during this admission.   Objective:  Vital signs in last 24 hours:  Temp:  [97.9 F (36.6 C)-98.4 F (36.9 C)] 98.4 F (36.9 C) (11/30 0959) Pulse Rate:  [69-92] 86 (11/30 0959) Resp:  [12-20] 14 (11/30 0959) BP: (127-228)/(90-186) 162/98 (11/30 1037) SpO2:  [78 %-98 %] 94 % (11/30 0959) Weight:  [76.1 kg-79.4 kg] 76.2 kg (11/30 1017)  Weight change:  Filed Weights   04/30/23 1326 04/30/23 2200 05/01/23 1017  Weight: 79.4  kg 76.1 kg 76.2 kg    Intake/Output: I/O last 3 completed shifts: In: 440 [P.O.:90; IV Piggyback:350] Out: -    Intake/Output this shift:  No intake/output data recorded.  Physical Exam: General: Ill-appearing  Head: Normocephalic, atraumatic.   Eyes: Anicteric  Lungs:  Diminished, NCO 2  Heart: Regular rate and rhythm  Abdomen:  Soft, nontender,   Extremities: 1-2+ peripheral edema.  Neurologic: Somnolent  Skin: No lesions  Access: Left arm aVF    Basic Metabolic Panel: Recent Labs  Lab 04/30/23 1338 05/01/23 0457  NA 140 139  K 5.0 5.9*  CL 93* 97*  CO2 30 27  GLUCOSE 114* 70  BUN 52* 61*  CREATININE 7.52* 8.49*  CALCIUM 9.2 9.2    Liver Function Tests: Recent Labs  Lab 04/30/23 1338  AST 33  ALT 36  ALKPHOS 115  BILITOT 1.0  PROT 8.1  ALBUMIN 3.6   No results for input(s): "LIPASE", "AMYLASE" in the last 168 hours. No results for input(s): "AMMONIA" in the last 168 hours.  CBC: Recent Labs  Lab 04/30/23 1338 05/01/23 0457  WBC 7.0 6.6  HGB 13.0 11.5*  HCT 38.6* 34.3*  MCV 102.7* 101.8*  PLT 240 209    Cardiac Enzymes: No results for input(s): "CKTOTAL", "CKMB", "CKMBINDEX", "TROPONINI" in the last 168 hours.  BNP: Invalid input(s): "POCBNP"  CBG: No results for input(s): "GLUCAP" in the last 168 hours.  Microbiology: Results for orders placed or performed during the hospital encounter of 04/30/23  MRSA Next Gen by PCR, Nasal     Status: None   Collection Time: 05/01/23 12:30 AM   Specimen: Nasal Mucosa; Nasal Swab  Result Value Ref Range Status   MRSA by PCR Next Gen NOT DETECTED NOT DETECTED Final    Comment: (NOTE) The GeneXpert MRSA Assay (FDA approved for NASAL specimens only), is one component of a comprehensive MRSA colonization surveillance program. It is not intended to diagnose MRSA infection nor to guide or monitor treatment for MRSA infections. Test performance is not FDA approved in patients less than 59  years old. Performed at [facility], [address] [place], [place] [address]     Coagulation Studies: No results for input(s): "LABPROT", "INR" in the last 72 hours.  Urinalysis: No results for input(s): "COLORURINE", "LABSPEC", "PHURINE", "GLUCOSEU", "HGBUR", "BILIRUBINUR", "KETONESUR", "PROTEINUR", "UROBILINOGEN", "NITRITE", "LEUKOCYTESUR" in the last 72 hours.  Invalid input(s): "APPERANCEUR"    Imaging: DG Chest Port 1 View  Result Date: 04/30/2023 CLINICAL DATA:  Shortness of breath EXAM: PORTABLE CHEST 1 VIEW COMPARISON:  Chest radiograph dated 11/03/2022 FINDINGS: Normal lung volumes. Asymmetric right perihilar opacity. Mild bilateral interstitial opacities. Dense left retrocardiac opacity. Trace blunting of the bilateral costophrenic angles. No pneumothorax. Similar enlarged cardiomediastinal silhouette. No acute osseous abnormality. IMPRESSION: 1. Asymmetric right perihilar opacity, which may represent asymmetric pulmonary edema or infection. 2. Dense left retrocardiac opacity, which may represent atelectasis, aspiration, or pneumonia. 3. Trace blunting of the bilateral costophrenic angles, which may represent trace pleural effusions. Electronically Signed   By: Agustin Cree M.D.   On: 04/30/2023 13:57     Medications:    cefTRIAXone (ROCEPHIN)  IV Stopped (04/30/23 1900)    ascorbic acid  500 mg Oral Daily   azithromycin  500 mg Oral q1800   calcium acetate  1,334 mg Oral TID WC   carbamazepine  1,000 mg Oral q morning   Chlorhexidine Gluconate Cloth  6 each Topical Q0600   cholecalciferol  1,000 Units Oral q AM   ferrous sulfate  325 mg Oral Daily   fluPHENAZine  2.5 mg Oral Q breakfast   heparin  5,000 Units Subcutaneous Q8H   ipratropium-albuterol  3 mL Nebulization TID   isosorbide mononitrate  60 mg Oral BID   levothyroxine  125 mcg Oral Q0600   mometasone-formoterol  2 puff Inhalation BID   pantoprazole  80 mg Oral QAC breakfast   trihexyphenidyl  2  mg Oral BID   zinc sulfate (50mg  elemental zinc)  220 mg Oral Daily   acetaminophen **OR** acetaminophen, alteplase, guaiFENesin, heparin, heparin, hydrALAZINE, lidocaine, ondansetron **OR** ondansetron (ZOFRAN) IV, mouth rinse, polyethylene glycol  Assessment/ Plan:  Mr. JYRELL GARVEY is a 62 y.o.  male with past medical conditions including COPD, hypertension, DVT, schizophrenia, and end-stage renal disease on hemodialysis.  Patient presents to the emergency department with combative behavior at outpatient dialysis clinic.  Patient has been admitted for Acute hypoxemic respiratory failure (HCC) [J96.01] Acute hypoxic respiratory failure (HCC) [J96.01]   [facility]/MWF/left aVF   End-stage renal disease on hemodialysis.  Patient completed less than half of treatment at outpatient clinic prior to ED arrival.  Patient receiving treatment today, UF goal 3 to 3.5 L as tolerated.  Next treatment scheduled for Monday.  2.  Hyperkalemia, potassium 5.9.  Will dialyze patient on 2K bath for management.  3. Anemia of chronic kidney disease Lab Results  Component Value Date   HGB 11.5 (L) 05/01/2023    Hemoglobin within desired range.  Patient does receive Mircera at outpatient clinic.  Will monitor for now.  4. Secondary Hyperparathyroidism: with outpatient labs: PTH 730, phosphorus 5.4, calcium 9.2  on 04/21/23.   Lab Results  Component Value Date   PTH 194 (H) 05/11/2017   CALCIUM 9.2 05/01/2023   CAION 1.19 03/27/2022   PHOS 3.2 11/06/2022    Calcium acceptable.  Will continue to monitor bone minerals during this admission.  5.  Acute hypoxic respiratory failure, differential diagnosis includes CAP versus asymmetric pulmonary edema.  Patient will receive dialysis today for fluid removal.  Empiric antibiotic started by primary team.   LOS: 1 Paden Kuras 11/30/202412:45 PM

## 2023-05-01 NOTE — Progress Notes (Addendum)
Received patient in bed .Awake,alert and oriented to person ,place.He is blind he gave verbal consent for his Hd treatment as witnessed/heard by other HD nurses.  Access used : Right upper arm AVG that worked well.  Duration of treatment: 3.5  hours .  UF goal : 3.5 liters.  Hemo comment: At this moment patient's treatment going on very well,tolerating treatment,hand off to the HD charge nurse.

## 2023-05-01 NOTE — Progress Notes (Signed)
Hemodialysis note  Received patient in bed to unit. Alert and oriented.  Informed consent signed and in chart.  Treatment initiated: 1022 Treatment completed: 1415  Patient tolerated well. Transported back to room, alert without acute distress.  Report given to patient's RN.   Access used: LUA AVG Access issues: none  Total UF removed: 3.5L Medication(s) given:  none  Post HD weight: 68.8 kg   Wolfgang Phoenix [facility]

## 2023-05-01 NOTE — Plan of Care (Signed)
  Problem: Education: Goal: Knowledge of General Education information will improve Description Including pain rating scale, medication(s)/side effects and non-pharmacologic comfort measures Outcome: Progressing   

## 2023-05-01 NOTE — Progress Notes (Signed)
  PROGRESS NOTE    Todd Brown  MVH:[id number] DOB: 03-05-1961 DOA: 04/30/2023 PCP: Smiley Houseman, NP  [id number]  LOS: 1 day   Brief hospital course:   Assessment & Plan: Todd Brown is a 62 year old male with history of end-stage renal disease on hemodialysis, schizophrenia, hypertension, COPD, who presents to the emergency department for chief concerns of agitation, and hypoxic respiratory failure at hemodialysis center.    * Acute hypoxemic respiratory failure (HCC) 2/2 Pulm edema from volume overload --no strong evidence of PNA.  No fever, no leukocytosis, procal 0.28. Plan: --cont iHD for volume removal --d/c ceftriaxone and azithromycin ----Continue supplemental O2 to keep sats >=92%, wean as tolerated  Hypertensive urgency --BP up to 220's, likely due to volume overload, and may have contributed to pulm edema.  Only has Imdur on home med list. --BP still elevated after dialysis today --start oral hydralazine 50 q8h --cont Imdur --IV hydralazine PRN  Acute encephalopathy --pt was noted to be combative, however, unclear whether it's acute encephalopathy or baseline behavior with underlying psych issues.  Pt was more calm today.  ESRD on hemodialysis (HCC) --iHD per nephrology  Hypothyroidism --cont Levothyroxine 125 mcg daily   Physical deconditioning Fall precautions  Schizophrenia (HCC) With schizoaffective disorder --cont home Fluphenazine 2.5 mg every morning, Carbamazepine 1000 mg every morning and trihexyphenidyl 2 mg p.o. twice daily   Productive cough Guaifenesin 5 mL p.o. every 4 hours as needed for cough and to loosen phlegm, 3 days ordered  Low back pain Patient states his pain has been there for a long time PDMP reviewed, no active pain medication ordered at this time Home naproxen 500 mg p.o. twice daily as needed for mild pain resumed on admission Lidocaine patch, every 12 hours daily as needed for pain ordered on  admission  Elevated troponin Suspect secondary to demand ischemia in setting of acute hypoxic respiratory failure and hypertensive urgency. --trop 29, 37  Blindness of both eyes Baseline, 'for a while' per patient   DVT prophylaxis: Heparin SQ Code Status: Full code  Family Communication:  Level of care: Telemetry Cardiac Dispo:   The patient is from: SNF Anticipated d/c is to: SNF Anticipated d/c date is: 1-2 days Patient currently is not medically ready to d/c due to: hypoxia with volume overload   Subjective and Interval History:  Pt reported no dyspnea after dialysis treatment.     Objective: Vitals:   05/01/23 1330 05/01/23 1415 05/01/23 1434 05/01/23 1610  BP: (!) 167/104 (!) 164/108  (!) 188/86  Pulse: 67 74    Resp: 14 13    Temp:  98.2 F (36.8 C)    TempSrc:  Oral    SpO2: 98% 96%    Weight:   68.8 kg   Height:        Intake/Output Summary (Last 24 hours) at 05/01/2023 1851 Last data filed at 05/01/2023 1415 Gross per 24 hour  Intake 440 ml  Output 3500 ml  Net -3060 ml   Filed Weights   04/30/23 2200 05/01/23 1017 05/01/23 1434  Weight: 76.1 kg 76.2 kg 68.8 kg    Examination:   Constitutional: NAD, alert HEENT: conjunctivae and lids normal, EOMI CV: No cyanosis.   RESP: normal respiratory effort Neuro: II - XII grossly intact.     Data Reviewed: I have personally reviewed labs and imaging studies  Time spent: 50 minutes  Darlin Priestly, MD [facility] If 7PM-7AM, please contact night-coverage 05/01/2023, 6:51 PM

## 2023-05-02 DIAGNOSIS — J9601 Acute respiratory failure with hypoxia: Secondary | ICD-10-CM | POA: Diagnosis not present

## 2023-05-02 MED ORDER — NICOTINE 21 MG/24HR TD PT24
21.0000 mg | MEDICATED_PATCH | Freq: Every day | TRANSDERMAL | Status: DC
  Administered 2023-05-04: 21 mg via TRANSDERMAL
  Filled 2023-05-02 (×2): qty 1

## 2023-05-02 MED ORDER — HYDRALAZINE HCL 50 MG PO TABS
50.0000 mg | ORAL_TABLET | Freq: Once | ORAL | Status: AC
  Administered 2023-05-02: 50 mg via ORAL
  Filled 2023-05-02: qty 1

## 2023-05-02 MED ORDER — HYDRALAZINE HCL 50 MG PO TABS
100.0000 mg | ORAL_TABLET | Freq: Three times a day (TID) | ORAL | Status: DC
  Administered 2023-05-02 – 2023-05-04 (×6): 100 mg via ORAL
  Filled 2023-05-02 (×6): qty 2

## 2023-05-02 MED ORDER — IPRATROPIUM-ALBUTEROL 0.5-2.5 (3) MG/3ML IN SOLN
3.0000 mL | Freq: Four times a day (QID) | RESPIRATORY_TRACT | Status: DC | PRN
  Administered 2023-05-02: 3 mL via RESPIRATORY_TRACT
  Filled 2023-05-02: qty 3

## 2023-05-02 NOTE — Progress Notes (Signed)
[facility]  ROUNDING NOTE   Subjective:   Todd Brown  is a 62 year old male with past medical conditions including COPD, hypertension, DVT, schizophrenia, and end-stage renal disease on hemodialysis.  Patient presents to the emergency department with combative behavior at outpatient dialysis clinic.  Patient has been admitted for Acute hypoxemic respiratory failure (HCC) [J96.01] Acute hypoxic respiratory failure (HCC) [J96.01]  Patient is known to our practice and receives outpatient dialysis treatments at [facility] on a MWF schedule, supervised by Dr. Cherylann Ratel.    Patient sitting up in bed Alert Blood pressure remains elevated Requesting to discharge today  Dialysis yesterday with 3.5L removed.   Objective:  Vital signs in last 24 hours:  Temp:  [98 F (36.7 C)-98.5 F (36.9 C)] 98.5 F (36.9 C) (12/01 1316) Pulse Rate:  [55-81] 80 (12/01 0700) Resp:  [0-27] 17 (12/01 0700) BP: (134-212)/(83-108) 134/96 (12/01 1316) SpO2:  [96 %-100 %] 98 % (12/01 1051) Weight:  [68.8 kg] 68.8 kg (11/30 1434)  Weight change: -3.179 kg Filed Weights   04/30/23 2200 05/01/23 1017 05/01/23 1434  Weight: 76.1 kg 76.2 kg 68.8 kg    Intake/Output: I/O last 3 completed shifts: In: 340 [P.O.:90; IV Piggyback:250] Out: 3500 [Other:3500]   Intake/Output this shift:  No intake/output data recorded.  Physical Exam: General: NAD  Head: Normocephalic, atraumatic.   Eyes: Anicteric  Lungs:  Diminished, NCO 2  Heart: Regular rate and rhythm  Abdomen:  Soft, nontender,   Extremities: No peripheral edema.  Neurologic: Alert  Skin: No lesions  Access: Left arm aVF    Basic Metabolic Panel: Recent Labs  Lab 04/30/23 1338 05/01/23 0457  NA 140 139  K 5.0 5.9*  CL 93* 97*  CO2 30 27  GLUCOSE 114* 70  BUN 52* 61*  CREATININE 7.52* 8.49*  CALCIUM 9.2 9.2    Liver Function Tests: Recent Labs  Lab 04/30/23 1338  AST 33  ALT 36  ALKPHOS 115  BILITOT 1.0   PROT 8.1  ALBUMIN 3.6   No results for input(s): "LIPASE", "AMYLASE" in the last 168 hours. No results for input(s): "AMMONIA" in the last 168 hours.  CBC: Recent Labs  Lab 04/30/23 1338 05/01/23 0457  WBC 7.0 6.6  HGB 13.0 11.5*  HCT 38.6* 34.3*  MCV 102.7* 101.8*  PLT 240 209    Cardiac Enzymes: No results for input(s): "CKTOTAL", "CKMB", "CKMBINDEX", "TROPONINI" in the last 168 hours.  BNP: Invalid input(s): "POCBNP"  CBG: No results for input(s): "GLUCAP" in the last 168 hours.  Microbiology: Results for orders placed or performed during the hospital encounter of 04/30/23  MRSA Next Gen by PCR, Nasal     Status: None   Collection Time: 05/01/23 12:30 AM   Specimen: Nasal Mucosa; Nasal Swab  Result Value Ref Range Status   MRSA by PCR Next Gen NOT DETECTED NOT DETECTED Final    Comment: (NOTE) The GeneXpert MRSA Assay (FDA approved for NASAL specimens only), is one component of a comprehensive MRSA colonization surveillance program. It is not intended to diagnose MRSA infection nor to guide or monitor treatment for MRSA infections. Test performance is not FDA approved in patients less than 60 years old. Performed at [facility], [address] [place], [place] [address]     Coagulation Studies: No results for input(s): "LABPROT", "INR" in the last 72 hours.  Urinalysis: No results for input(s): "COLORURINE", "LABSPEC", "PHURINE", "GLUCOSEU", "HGBUR", "BILIRUBINUR", "KETONESUR", "PROTEINUR", "UROBILINOGEN", "NITRITE", "LEUKOCYTESUR" in the last 72  hours.  Invalid input(s): "APPERANCEUR"    Imaging: DG Chest Port 1 View  Result Date: 04/30/2023 CLINICAL DATA:  Shortness of breath EXAM: PORTABLE CHEST 1 VIEW COMPARISON:  Chest radiograph dated 11/03/2022 FINDINGS: Normal lung volumes. Asymmetric right perihilar opacity. Mild bilateral interstitial opacities. Dense left retrocardiac opacity. Trace blunting of the bilateral costophrenic angles. No  pneumothorax. Similar enlarged cardiomediastinal silhouette. No acute osseous abnormality. IMPRESSION: 1. Asymmetric right perihilar opacity, which may represent asymmetric pulmonary edema or infection. 2. Dense left retrocardiac opacity, which may represent atelectasis, aspiration, or pneumonia. 3. Trace blunting of the bilateral costophrenic angles, which may represent trace pleural effusions. Electronically Signed   By: Agustin Cree M.D.   On: 04/30/2023 13:57     Medications:      ascorbic acid  500 mg Oral Daily   calcium acetate  1,334 mg Oral TID WC   carbamazepine  1,000 mg Oral q morning   Chlorhexidine Gluconate Cloth  6 each Topical Q0600   cholecalciferol  1,000 Units Oral q AM   ferrous sulfate  325 mg Oral Daily   fluPHENAZine  2.5 mg Oral Q breakfast   heparin  5,000 Units Subcutaneous Q8H   hydrALAZINE  100 mg Oral Q8H   isosorbide mononitrate  60 mg Oral BID   levothyroxine  125 mcg Oral Q0600   mometasone-formoterol  2 puff Inhalation BID   pantoprazole  80 mg Oral QAC breakfast   trihexyphenidyl  2 mg Oral BID   zinc sulfate (50mg  elemental zinc)  220 mg Oral Daily   acetaminophen **OR** acetaminophen, guaiFENesin, ipratropium-albuterol, lidocaine, ondansetron **OR** ondansetron (ZOFRAN) IV, mouth rinse, polyethylene glycol  Assessment/ Plan:  Todd Brown is a 62 y.o.  male with past medical conditions including COPD, hypertension, DVT, schizophrenia, and end-stage renal disease on hemodialysis.  Patient presents to the emergency department with combative behavior at outpatient dialysis clinic.  Patient has been admitted for Acute hypoxemic respiratory failure (HCC) [J96.01] Acute hypoxic respiratory failure (HCC) [J96.01]   [facility]/MWF/left aVF   End-stage renal disease on hemodialysis.  Dialysis received yesterday, UF 3.5L achieved. Next treatment scheduled for Monday  2.  Hyperkalemia, potassium 5.9.  Received dialysis yesterday on 2K bath.  Will draw labs in am  3. Anemia of chronic kidney disease Lab Results  Component Value Date   HGB 11.5 (L) 05/01/2023    Patient does receive Mircera at outpatient clinic.  Will monitor for now.  4. Secondary Hyperparathyroidism: with outpatient labs: PTH 730, phosphorus 5.4, calcium 9.2 on 04/21/23.   Lab Results  Component Value Date   PTH 194 (H) 05/11/2017   CALCIUM 9.2 05/01/2023   CAION 1.19 03/27/2022   PHOS 3.2 11/06/2022    Will continue to monitor bone minerals during this admission.  5.  Acute hypoxic respiratory failure, differential diagnosis includes CAP versus asymmetric pulmonary edema.  Patient will receive dialysis today for fluid removal.  Received antibiotics yesterday.   6. Hypertensiive urgency likely secondary to volume overload. Remained elevated this morning.     LOS: 2 Morley Gaumer 12/1/20241:22 PM

## 2023-05-02 NOTE — Plan of Care (Signed)

## 2023-05-02 NOTE — Progress Notes (Signed)
PROGRESS NOTE    Todd Brown   ZOX:[id number] DOB: 03/08/1961  DOA: 04/30/2023 Date of Service: 05/02/23 which is hospital day 2  PCP: Todd Houseman, NP    HPI: [name] is a 62 year old male with history of end-stage renal disease on hemodialysis, schizophrenia, hypertension, COPD, who presents to the emergency department for chief concerns of agitation, and hypoxic respiratory failure at hemodialysis center.Per ED report, patient SpO2 was in the 80s on room air at dialysis.  Patient received 54 minutes of hemodialysis with 735 mm of fluid removal.  Patient is a ward of the state.  Hospital course / significant events:  11/29: admitted to hospitalist service for acute hypoxic respiratory failure d/t pulmonary edema, HTN urgency 11/30: 3.5L out in HD  12/01: stable, given significant fluid off in HD yesterday, nephrology recs for repeat HD tomorrow    Consultants:  nephrology  Procedures/Surgeries: none      ASSESSMENT & PLAN:   Acute hypoxemic respiratory failure 2/2 Pulm edema from volume overload Have reasonably ruled out pneumonia  HD for volume removal, repeat session tomorrow  No abx  supplemental O2 to keep sats >=92%, wean as tolerated   Hypertensive urgency likely due to volume overload, and may have contributed to pulm edema. oral hydralazine 100 q8h cont Imdur He has been periodically refusing medications    Acute encephalopathy uncertain etiology, va sgitation d/t underlying schizophrenia w/ schizoaffective d/o - resolved / baseline  pt was noted to be combative, however, unclear whether it's acute encephalopathy or baseline behavior with underlying psych issues.  Pt was more calm today. Monitor  Of note, underlying psychiatric issues, he is ward of the state  cont home Fluphenazine 2.5 mg every morning, Carbamazepine 1000 mg every morning and trihexyphenidyl 2 mg p.o. twice daily    ESRD on hemodialysis  HD per nephrology    Hypothyroidism Levothyroxine 125 mcg daily    Physical deconditioning Fall precautions  Productive cough Guaifenesin 5 mL p.o. every 4 hours as needed for cough and to loosen phlegm, 3 days ordered   Low back pain Patient states his pain has been there for a long time PDMP reviewed, no active pain medication ordered at this time Home naproxen 500 mg p.o. twice daily as needed for mild pain resumed on admission Lidocaine patch, every 12 hours daily as needed for pain ordered on admission   Elevated troponin not clinically significant  Demand ischemia in setting of acute hypoxic respiratory failure and hypertensive urgency as well as ESRD. trop 29, 37 Monitor as needed w/ chest pain   Blindness of both eyes Baseline, 'for a while' per patient    normal weight based on BMI: Body mass index is 19.47 kg/m.  Underweight - under 18.5  normal weight - 18.5 to 24.9 overweight - 25 to 29.9 obese - 30 or more   DVT prophylaxis: heparin IV fluids: no continuous IV fluids  Nutrition: renal w/ fluid restriction Central lines / invasive devices: none  Code Status: FULL CODE ACP documentation reviewed: 05/02/23 and none on file in [facility]  TOC needs: TBD,  Barriers to dispo / significant pending items: dialysis tomorrow, if remains stable following HD / back to baseline, can anticipate discharge tomorrow              Subjective / Brief ROS:  Patient reports no concerns other than wants to smoke, I offered nicotine patch and he was amenable to this  Denies CP/SOB.  Pain controlled.  Denies  new weakness.  Tolerating diet.  Reports no concerns w/ urination/defecation.   Family Communication: none at this time will d/w legal guardian tomorrow / when anticipating discharge     Objective Findings:  Vitals:   05/02/23 0348 05/02/23 0700 05/02/23 1051 05/02/23 1316  BP: (!) 149/83 (!) 212/96  (!) 134/96  Pulse: 73 80    Resp: 14 17    Temp: 98.2 F (36.8 C) 98 F  (36.7 C)  98.5 F (36.9 C)  TempSrc: Oral Oral    SpO2: 96% 96% 98%   Weight:      Height:       No intake or output data in the 24 hours ending 05/02/23 1524  Filed Weights   04/30/23 2200 05/01/23 1017 05/01/23 1434  Weight: 76.1 kg 76.2 kg 68.8 kg    Examination:  Physical Exam Constitutional:      General: He is not in acute distress. Cardiovascular:     Rate and Rhythm: Normal rate and regular rhythm.  Pulmonary:     Effort: Pulmonary effort is normal.     Breath sounds: Normal breath sounds.  Musculoskeletal:     Right lower leg: No edema.     Left lower leg: No edema.  Neurological:     Mental Status: He is alert.     Comments: Garbled speech is baseline   Psychiatric:        Mood and Affect: Mood normal.        Behavior: Behavior normal.          Scheduled Medications:   ascorbic acid  500 mg Oral Daily   calcium acetate  1,334 mg Oral TID WC   carbamazepine  1,000 mg Oral q morning   Chlorhexidine Gluconate Cloth  6 each Topical Q0600   cholecalciferol  1,000 Units Oral q AM   ferrous sulfate  325 mg Oral Daily   fluPHENAZine  2.5 mg Oral Q breakfast   heparin  5,000 Units Subcutaneous Q8H   hydrALAZINE  100 mg Oral Q8H   isosorbide mononitrate  60 mg Oral BID   levothyroxine  125 mcg Oral Q0600   mometasone-formoterol  2 puff Inhalation BID   pantoprazole  80 mg Oral QAC breakfast   trihexyphenidyl  2 mg Oral BID   zinc sulfate (50mg  elemental zinc)  220 mg Oral Daily    Continuous Infusions:   PRN Medications:  acetaminophen **OR** acetaminophen, guaiFENesin, ipratropium-albuterol, lidocaine, ondansetron **OR** ondansetron (ZOFRAN) IV, mouth rinse, polyethylene glycol  Antimicrobials from admission:  Anti-infectives (From admission, onward)    Start     Dose/Rate Route Frequency Ordered Stop   05/01/23 1800  azithromycin (ZITHROMAX) tablet 500 mg  Status:  Discontinued        500 mg Oral Daily-1800 05/01/23 0814 05/01/23 1854    04/30/23 1615  azithromycin (ZITHROMAX) 500 mg in sodium chloride 0.9 % 250 mL IVPB  Status:  Discontinued        500 mg 250 mL/hr over 60 Minutes Intravenous Every 24 hours 04/30/23 1610 05/01/23 0814   04/30/23 1615  cefTRIAXone (ROCEPHIN) 2 g in sodium chloride 0.9 % 100 mL IVPB  Status:  Discontinued        2 g 200 mL/hr over 30 Minutes Intravenous Every 24 hours 04/30/23 1610 05/01/23 1854           Data Reviewed:  I have personally reviewed the following...  CBC: Recent Labs  Lab 04/30/23 1338 05/01/23 0457  WBC 7.0 6.6  HGB 13.0 11.5*  HCT 38.6* 34.3*  MCV 102.7* 101.8*  PLT 240 209   Basic Metabolic Panel: Recent Labs  Lab 04/30/23 1338 05/01/23 0457  NA 140 139  K 5.0 5.9*  CL 93* 97*  CO2 30 27  GLUCOSE 114* 70  BUN 52* 61*  CREATININE 7.52* 8.49*  CALCIUM 9.2 9.2   GFR: Estimated Creatinine Clearance: 8.8 mL/min (A) (by C-G formula based on SCr of 8.49 mg/dL (H)). Liver Function Tests: Recent Labs  Lab 04/30/23 1338  AST 33  ALT 36  ALKPHOS 115  BILITOT 1.0  PROT 8.1  ALBUMIN 3.6   No results for input(s): "LIPASE", "AMYLASE" in the last 168 hours. No results for input(s): "AMMONIA" in the last 168 hours. Coagulation Profile: No results for input(s): "INR", "PROTIME" in the last 168 hours. Cardiac Enzymes: No results for input(s): "CKTOTAL", "CKMB", "CKMBINDEX", "TROPONINI" in the last 168 hours. BNP (last 3 results) No results for input(s): "PROBNP" in the last 8760 hours. HbA1C: No results for input(s): "HGBA1C" in the last 72 hours. CBG: No results for input(s): "GLUCAP" in the last 168 hours. Lipid Profile: No results for input(s): "CHOL", "HDL", "LDLCALC", "TRIG", "CHOLHDL", "LDLDIRECT" in the last 72 hours. Thyroid Function Tests: No results for input(s): "TSH", "T4TOTAL", "FREET4", "T3FREE", "THYROIDAB" in the last 72 hours. Anemia Panel: No results for input(s): "VITAMINB12", "FOLATE", "FERRITIN", "TIBC", "IRON", "RETICCTPCT"  in the last 72 hours. Most Recent Urinalysis On File:     Component Value Date/Time   COLORURINE YELLOW (A) 05/17/2015 0012   APPEARANCEUR HAZY (A) 05/17/2015 0012   LABSPEC 1.009 05/17/2015 0012   PHURINE 7.0 05/17/2015 0012   GLUCOSEU NEGATIVE 05/17/2015 0012   HGBUR 1+ (A) 05/17/2015 0012   BILIRUBINUR NEGATIVE 05/17/2015 0012   KETONESUR NEGATIVE 05/17/2015 0012   PROTEINUR 30 (A) 05/17/2015 0012   NITRITE NEGATIVE 05/17/2015 0012   LEUKOCYTESUR 3+ (A) 05/17/2015 0012   Sepsis Labs: @LABRCNTIP (procalcitonin:4,lacticidven:4) Microbiology: Recent Results (from the past 240 hour(s))  MRSA Next Gen by PCR, Nasal     Status: None   Collection Time: 05/01/23 12:30 AM   Specimen: Nasal Mucosa; Nasal Swab  Result Value Ref Range Status   MRSA by PCR Next Gen NOT DETECTED NOT DETECTED Final    Comment: (NOTE) The GeneXpert MRSA Assay (FDA approved for NASAL specimens only), is one component of a comprehensive MRSA colonization surveillance program. It is not intended to diagnose MRSA infection nor to guide or monitor treatment for MRSA infections. Test performance is not FDA approved in patients less than 69 years old. Performed at [facility], [address], [place], [place] [address]       Radiology Studies last 3 days: [facility] 1 View  Result Date: 04/30/2023 CLINICAL DATA:  Shortness of breath EXAM: PORTABLE CHEST 1 VIEW COMPARISON:  Chest radiograph dated 11/03/2022 FINDINGS: Normal lung volumes. Asymmetric right perihilar opacity. Mild bilateral interstitial opacities. Dense left retrocardiac opacity. Trace blunting of the bilateral costophrenic angles. No pneumothorax. Similar enlarged cardiomediastinal silhouette. No acute osseous abnormality. IMPRESSION: 1. Asymmetric right perihilar opacity, which may represent asymmetric pulmonary edema or infection. 2. Dense left retrocardiac opacity, which may represent atelectasis, aspiration, or pneumonia. 3. Trace  blunting of the bilateral costophrenic angles, which may represent trace pleural effusions. Electronically Signed   By: Agustin Cree M.D.   On: 04/30/2023 13:57         Sunnie Nielsen, DO [facility] 05/02/2023, 3:24 PM    Dictation software may  have been used to generate the above note. Typos may occur and escape review in typed/dictated notes. Please contact Dr Lyn Hollingshead directly for clarity if needed.  Staff may message me via secure chat in Epic  but this may not receive an immediate response,  please page me for urgent matters!  If 7PM-7AM, please contact night coverage [email or web address]

## 2023-05-02 NOTE — Progress Notes (Signed)
05/02/2023 1900 Received pt to room 121A from ICU.  Pt is A&O to self and place.  Oriented to room, call light and bed.  Call bell in reach-bed alarm on. Kathryne Hitch

## 2023-05-03 DIAGNOSIS — J9601 Acute respiratory failure with hypoxia: Secondary | ICD-10-CM | POA: Diagnosis not present

## 2023-05-03 LAB — CBC
HCT: 38.6 % — ABNORMAL LOW (ref 39.0–52.0)
Hemoglobin: 13 g/dL (ref 13.0–17.0)
MCH: 33.5 pg (ref 26.0–34.0)
MCHC: 33.7 g/dL (ref 30.0–36.0)
MCV: 99.5 fL (ref 80.0–100.0)
Platelets: 225 10*3/uL (ref 150–400)
RBC: 3.88 MIL/uL — ABNORMAL LOW (ref 4.22–5.81)
RDW: 15.2 % (ref 11.5–15.5)
WBC: 6.4 10*3/uL (ref 4.0–10.5)
nRBC: 0 % (ref 0.0–0.2)

## 2023-05-03 LAB — RENAL FUNCTION PANEL
Albumin: 3.3 g/dL — ABNORMAL LOW (ref 3.5–5.0)
Anion gap: 16 — ABNORMAL HIGH (ref 5–15)
BUN: 61 mg/dL — ABNORMAL HIGH (ref 8–23)
CO2: 26 mmol/L (ref 22–32)
Calcium: 9.4 mg/dL (ref 8.9–10.3)
Chloride: 93 mmol/L — ABNORMAL LOW (ref 98–111)
Creatinine, Ser: 8.62 mg/dL — ABNORMAL HIGH (ref 0.61–1.24)
GFR, Estimated: 6 mL/min — ABNORMAL LOW (ref >60)
Glucose, Bld: 76 mg/dL (ref 70–99)
Phosphorus: 5.6 mg/dL — ABNORMAL HIGH (ref 2.5–4.6)
Potassium: 5.4 mmol/L — ABNORMAL HIGH (ref 3.5–5.1)
Sodium: 135 mmol/L (ref 135–145)

## 2023-05-03 LAB — BASIC METABOLIC PANEL
Anion gap: 16 — ABNORMAL HIGH (ref 5–15)
BUN: 61 mg/dL — ABNORMAL HIGH (ref 8–23)
CO2: 26 mmol/L (ref 22–32)
Calcium: 9.3 mg/dL (ref 8.9–10.3)
Chloride: 93 mmol/L — ABNORMAL LOW (ref 98–111)
Creatinine, Ser: 8.58 mg/dL — ABNORMAL HIGH (ref 0.61–1.24)
GFR, Estimated: 6 mL/min — ABNORMAL LOW (ref >60)
Glucose, Bld: 76 mg/dL (ref 70–99)
Potassium: 5.4 mmol/L — ABNORMAL HIGH (ref 3.5–5.1)
Sodium: 135 mmol/L (ref 135–145)

## 2023-05-03 LAB — HEPATITIS B SURFACE ANTIGEN: Hepatitis B Surface Ag: NONREACTIVE

## 2023-05-03 LAB — MAGNESIUM: Magnesium: 2.5 mg/dL — ABNORMAL HIGH (ref 1.7–2.4)

## 2023-05-03 MED ORDER — HEPARIN SODIUM (PORCINE) 1000 UNIT/ML DIALYSIS: 1000.0000 [IU] | INTRAMUSCULAR | Status: DC | PRN

## 2023-05-03 MED ORDER — PENTAFLUOROPROP-TETRAFLUOROETH EX AERO: 1.0000 | INHALATION_SPRAY | CUTANEOUS | Status: DC | PRN

## 2023-05-03 MED ORDER — LIDOCAINE-PRILOCAINE 2.5-2.5 % EX CREA: 1.0000 | TOPICAL_CREAM | CUTANEOUS | Status: DC | PRN

## 2023-05-03 NOTE — Progress Notes (Signed)
  Received patient in bed to unit.   Informed consent signed and in chart.    TX duration: 3.5hrs   Transported back to floor Hand-off given to patient's nurse. N o c/o and no distress noted    Access used: LAVF Access issues: none   Total UF removed: 3.5L Medication(s) given: none  Post HD weight: 67.5kg     Lynann Beaver  [facility]

## 2023-05-03 NOTE — Progress Notes (Signed)
Attempted to introduce patient to role of nurse navigator, patient off unit for Hemodialysis. Will return at another time.

## 2023-05-03 NOTE — Progress Notes (Signed)
[facility]  ROUNDING NOTE   Subjective:   Todd Brown  is a 62 year old male with past medical conditions including COPD, hypertension, DVT, schizophrenia, and end-stage renal disease on hemodialysis.  Patient presents to the emergency department with combative behavior at outpatient dialysis clinic.  Patient has been admitted for Acute hypoxemic respiratory failure (HCC) [J96.01] Acute hypoxic respiratory failure (HCC) [J96.01]  Patient is known to our practice and receives outpatient dialysis treatments at [facility] on a MWF schedule, supervised by Dr. Cherylann Ratel.    Update: Patient seen and evaluated during dialysis   HEMODIALYSIS FLOWSHEET:  Blood Flow Rate (mL/min): 349 mL/min (Simultaneous filing. User may not have seen previous data.) Arterial Pressure (mmHg): -167.46 mmHg (Simultaneous filing. User may not have seen previous data.) Venous Pressure (mmHg): 249.48 mmHg (Simultaneous filing. User may not have seen previous data.) TMP (mmHg): 17.98 mmHg (Simultaneous filing. User may not have seen previous data.) Ultrafiltration Rate (mL/min): 1257 mL/min (Simultaneous filing. User may not have seen previous data.) Dialysate Flow Rate (mL/min): 299 ml/min (Simultaneous filing. User may not have seen previous data.) Bolus Amount (mL): 300 mL  Resting comfortably. Blood pressure stable   Objective:  Vital signs in last 24 hours:  Temp:  [97.8 F (36.6 C)-98.6 F (37 C)] 98.6 F (37 C) (12/02 0758) Pulse Rate:  [69-87] 81 (12/02 0930) Resp:  [14-19] 17 (12/02 0930) BP: (134-200)/(84-107) 160/90 (12/02 0930) SpO2:  [96 %-99 %] 98 % (12/02 0930) Weight:  [70.9 kg] 70.9 kg (12/02 0813)  Weight change:  Filed Weights   05/01/23 1017 05/01/23 1434 05/03/23 0813  Weight: 76.2 kg 68.8 kg 70.9 kg    Intake/Output: No intake/output data recorded.   Intake/Output this shift:  No intake/output data recorded.  Physical Exam: General: NAD  Head:  Normocephalic, atraumatic.   Eyes: Anicteric  Lungs:  Clear, NCO 2  Heart: Regular rate and rhythm  Abdomen:  Soft, nontender,   Extremities: 1+ peripheral edema.  Neurologic: Alert  Skin: No lesions  Access: Left arm aVF    Basic Metabolic Panel: Recent Labs  Lab 04/30/23 1338 05/01/23 0457 05/03/23 0800  NA 140 139 135  135  K 5.0 5.9* 5.4*  5.4*  CL 93* 97* 93*  93*  CO2 30 27 26  26   GLUCOSE 114* 70 76  76  BUN 52* 61* 61*  61*  CREATININE 7.52* 8.49* 8.62*  8.58*  CALCIUM 9.2 9.2 9.4  9.3  MG  --   --  2.5*  PHOS  --   --  5.6*    Liver Function Tests: Recent Labs  Lab 04/30/23 1338 05/03/23 0800  AST 33  --   ALT 36  --   ALKPHOS 115  --   BILITOT 1.0  --   PROT 8.1  --   ALBUMIN 3.6 3.3*   No results for input(s): "LIPASE", "AMYLASE" in the last 168 hours. No results for input(s): "AMMONIA" in the last 168 hours.  CBC: Recent Labs  Lab 04/30/23 1338 05/01/23 0457 05/03/23 0800  WBC 7.0 6.6 6.4  HGB 13.0 11.5* 13.0  HCT 38.6* 34.3* 38.6*  MCV 102.7* 101.8* 99.5  PLT 240 209 225    Cardiac Enzymes: No results for input(s): "CKTOTAL", "CKMB", "CKMBINDEX", "TROPONINI" in the last 168 hours.  BNP: Invalid input(s): "POCBNP"  CBG: No results for input(s): "GLUCAP" in the last 168 hours.  Microbiology: Results for orders placed or performed during the hospital encounter of 04/30/23  MRSA Next  Gen by PCR, Nasal     Status: None   Collection Time: 05/01/23 12:30 AM   Specimen: Nasal Mucosa; Nasal Swab  Result Value Ref Range Status   MRSA by PCR Next Gen NOT DETECTED NOT DETECTED Final    Comment: (NOTE) The GeneXpert MRSA Assay (FDA approved for NASAL specimens only), is one component of a comprehensive MRSA colonization surveillance program. It is not intended to diagnose MRSA infection nor to guide or monitor treatment for MRSA infections. Test performance is not FDA approved in patients less than 54 years old. Performed at  [facility], [address] [place], [place] [address]     Coagulation Studies: No results for input(s): "LABPROT", "INR" in the last 72 hours.  Urinalysis: No results for input(s): "COLORURINE", "LABSPEC", "PHURINE", "GLUCOSEU", "HGBUR", "BILIRUBINUR", "KETONESUR", "PROTEINUR", "UROBILINOGEN", "NITRITE", "LEUKOCYTESUR" in the last 72 hours.  Invalid input(s): "APPERANCEUR"    Imaging: No results found.   Medications:      ascorbic acid  500 mg Oral Daily   calcium acetate  1,334 mg Oral TID WC   carbamazepine  1,000 mg Oral q morning   Chlorhexidine Gluconate Cloth  6 each Topical Q0600   cholecalciferol  1,000 Units Oral q AM   ferrous sulfate  325 mg Oral Daily   fluPHENAZine  2.5 mg Oral Q breakfast   heparin  5,000 Units Subcutaneous Q8H   hydrALAZINE  100 mg Oral Q8H   isosorbide mononitrate  60 mg Oral BID   levothyroxine  125 mcg Oral Q0600   mometasone-formoterol  2 puff Inhalation BID   nicotine  21 mg Transdermal Daily   pantoprazole  80 mg Oral QAC breakfast   trihexyphenidyl  2 mg Oral BID   zinc sulfate (50mg  elemental zinc)  220 mg Oral Daily   acetaminophen **OR** acetaminophen, guaiFENesin, heparin, ipratropium-albuterol, lidocaine, lidocaine-prilocaine, ondansetron **OR** ondansetron (ZOFRAN) IV, mouth rinse, pentafluoroprop-tetrafluoroeth, polyethylene glycol  Assessment/ Plan:  [name] is a 62 y.o.  male with past medical conditions including COPD, hypertension, DVT, schizophrenia, and end-stage renal disease on hemodialysis.  Patient presents to the emergency department with combative behavior at outpatient dialysis clinic.  Patient has been admitted for Acute hypoxemic respiratory failure (HCC) [J96.01] Acute hypoxic respiratory failure (HCC) [J96.01]   [facility]/MWF/left aVF   End-stage renal disease on hemodialysis.  Dialysis received on Saturday with UF 3.5L. Receiving dialysis today, UF goal 3-3.5L as  tolerated. Next treatment scheduled for Wednesday.  2.  Hyperkalemia, potassium 5.4 today. Will provide dialysis on a 2K bath today for management.   3. Anemia of chronic kidney disease Lab Results  Component Value Date   HGB 13.0 05/03/2023    Hemoglobin at goal.  Patient does receive Mircera at outpatient clinic.  No need for ESA during this admission.   4. Secondary Hyperparathyroidism: with outpatient labs: PTH 730, phosphorus 5.4, calcium 9.2 on 04/21/23.   Lab Results  Component Value Date   PTH 194 (H) 05/11/2017   CALCIUM 9.3 05/03/2023   CALCIUM 9.4 05/03/2023   CAION 1.19 03/27/2022   PHOS 5.6 (H) 05/03/2023    Will continue to monitor bone minerals during this admission.   5.  Acute hypoxic respiratory failure, differential diagnosis includes CAP versus asymmetric pulmonary edema.  Respiratory status has improved with dialysis. Antibiotics stopped    LOS: 3 Justn Quale 12/2/20249:58 AM

## 2023-05-03 NOTE — Progress Notes (Signed)
PROGRESS NOTE    KENTRE OVERMILLER   XLK:[id number] DOB: [birth year]  DOA: 04/30/2023 Date of Service: 05/03/23 which is hospital day 3  PCP: Smiley Houseman, NP    HPI: [name] is a 62 year old male with history of end-stage renal disease on hemodialysis, schizophrenia, hypertension, COPD, who presents to the emergency department for chief concerns of agitation, and hypoxic respiratory failure at hemodialysis center.Per ED report, patient SpO2 was in the 80s on room air at dialysis.  Patient received 54 minutes of hemodialysis with 735 mm of fluid removal.  Patient is a ward of the state.  Hospital course / significant events:  11/29: admitted to hospitalist service for acute hypoxic respiratory failure d/t pulmonary edema, HTN urgency 11/30: 3.5L out in HD  12/01: stable, given significant fluid off in HD yesterday, nephrology recs for repeat HD tomorrow   12/02: HD again 3.5L off. TOC to confirm placement / housing in anticipation discharge   Consultants:  nephrology  Procedures/Surgeries: none      ASSESSMENT & PLAN:   Acute hypoxemic respiratory failure 2/2 Pulm edema from volume overload Have reasonably ruled out pneumonia  HD for volume removal No abx  supplemental O2 to keep sats >=92%, wean as tolerated   Hypertensive urgency likely due to volume overload, and may have contributed to pulm edema. oral hydralazine 100 q8h cont Imdur He has been periodically refusing medications    Acute encephalopathy uncertain etiology, va sgitation d/t underlying schizophrenia w/ schizoaffective d/o - resolved / baseline  pt was noted to be combative, however, unclear whether it's acute encephalopathy or baseline behavior with underlying psych issues.  Pt was more calm today. Monitor  Of note, underlying psychiatric issues, he is ward of the state  cont home Fluphenazine 2.5 mg every morning, Carbamazepine 1000 mg every morning and trihexyphenidyl 2 mg p.o. twice  daily    ESRD on hemodialysis  HD per nephrology   Hypothyroidism Levothyroxine 125 mcg daily    Physical deconditioning Fall precautions  Productive cough Guaifenesin 5 mL p.o. every 4 hours as needed for cough and to loosen phlegm, 3 days ordered   Low back pain Patient states his pain has been there for a long time PDMP reviewed, no active pain medication ordered at this time Home naproxen 500 mg p.o. twice daily as needed for mild pain resumed on admission Lidocaine patch, every 12 hours daily as needed for pain ordered on admission   Elevated troponin not clinically significant  Demand ischemia in setting of acute hypoxic respiratory failure and hypertensive urgency as well as ESRD. trop 29, 37 Monitor as needed w/ chest pain   Blindness of both eyes Baseline, 'for a while' per patient    normal weight based on BMI: Body mass index is 19.47 kg/m.  Underweight - under 18.5  normal weight - 18.5 to 24.9 overweight - 25 to 29.9 obese - 30 or more   DVT prophylaxis: heparin - has been refusing and will not force treatment, order d/c  IV fluids: no continuous IV fluids  Nutrition: renal w/ fluid restriction Central lines / invasive devices: none  Code Status: FULL CODE ACP documentation reviewed: 05/02/23 and none on file in [facility]  TOC needs: TBD, confirm placement  Barriers to dispo / significant pending items: confirm placement             Subjective / Brief ROS:  Patient reports no concerns Denies CP/SOB.  Pain controlled.  Denies new weakness.  Tolerating diet.  Reports no concerns w/ urination/defecation.   Family Communication: none at this time     Objective Findings:  Vitals:   05/03/23 1100 05/03/23 1130 05/03/23 1155 05/03/23 1226  BP: (!) 142/91 (!) 129/92 (!) 159/94   Pulse: 85 85 81   Resp: 15 19 (!) 22   Temp:   98.1 F (36.7 C)   TempSrc:   Oral   SpO2: 97% 98% 99%   Weight:    67.5 kg  Height:        Intake/Output  Summary (Last 24 hours) at 05/03/2023 1544 Last data filed at 05/03/2023 1155 Gross per 24 hour  Intake --  Output 3500 ml  Net -3500 ml    Filed Weights   05/01/23 1434 05/03/23 0813 05/03/23 1226  Weight: 68.8 kg 70.9 kg 67.5 kg    Examination:  Physical Exam Constitutional:      General: He is not in acute distress. Cardiovascular:     Rate and Rhythm: Normal rate and regular rhythm.  Pulmonary:     Effort: Pulmonary effort is normal.     Breath sounds: Normal breath sounds.  Musculoskeletal:     Right lower leg: No edema.     Left lower leg: No edema.  Neurological:     Mental Status: He is alert.     Comments: Garbled speech is baseline   Psychiatric:        Mood and Affect: Mood normal.        Behavior: Behavior normal.          Scheduled Medications:   ascorbic acid  500 mg Oral Daily   calcium acetate  1,334 mg Oral TID WC   carbamazepine  1,000 mg Oral q morning   Chlorhexidine Gluconate Cloth  6 each Topical Q0600   cholecalciferol  1,000 Units Oral q AM   ferrous sulfate  325 mg Oral Daily   fluPHENAZine  2.5 mg Oral Q breakfast   heparin  5,000 Units Subcutaneous Q8H   hydrALAZINE  100 mg Oral Q8H   isosorbide mononitrate  60 mg Oral BID   levothyroxine  125 mcg Oral Q0600   mometasone-formoterol  2 puff Inhalation BID   nicotine  21 mg Transdermal Daily   pantoprazole  80 mg Oral QAC breakfast   trihexyphenidyl  2 mg Oral BID   zinc sulfate (50mg  elemental zinc)  220 mg Oral Daily    Continuous Infusions:   PRN Medications:  acetaminophen **OR** acetaminophen, ipratropium-albuterol, lidocaine, ondansetron **OR** ondansetron (ZOFRAN) IV, mouth rinse, polyethylene glycol  Antimicrobials from admission:  Anti-infectives (From admission, onward)    Start     Dose/Rate Route Frequency Ordered Stop   05/01/23 1800  azithromycin (ZITHROMAX) tablet 500 mg  Status:  Discontinued        500 mg Oral Daily-1800 05/01/23 0814 05/01/23 1854    04/30/23 1615  azithromycin (ZITHROMAX) 500 mg in sodium chloride 0.9 % 250 mL IVPB  Status:  Discontinued        500 mg 250 mL/hr over 60 Minutes Intravenous Every 24 hours 04/30/23 1610 05/01/23 0814   04/30/23 1615  cefTRIAXone (ROCEPHIN) 2 g in sodium chloride 0.9 % 100 mL IVPB  Status:  Discontinued        2 g 200 mL/hr over 30 Minutes Intravenous Every 24 hours 04/30/23 1610 05/01/23 1854           Data Reviewed:  I have personally reviewed the following...  CBC: Recent Labs  Lab  04/30/23 1338 05/01/23 0457 05/03/23 0800  WBC 7.0 6.6 6.4  HGB 13.0 11.5* 13.0  HCT 38.6* 34.3* 38.6*  MCV 102.7* 101.8* 99.5  PLT 240 209 225   Basic Metabolic Panel: Recent Labs  Lab 04/30/23 1338 05/01/23 0457 05/03/23 0800  NA 140 139 135  135  K 5.0 5.9* 5.4*  5.4*  CL 93* 97* 93*  93*  CO2 30 27 26  26   GLUCOSE 114* 70 76  76  BUN 52* 61* 61*  61*  CREATININE 7.52* 8.49* 8.62*  8.58*  CALCIUM 9.2 9.2 9.4  9.3  MG  --   --  2.5*  PHOS  --   --  5.6*   GFR: Estimated Creatinine Clearance: 8.5 mL/min (A) (by C-G formula based on SCr of 8.58 mg/dL (H)). Liver Function Tests: Recent Labs  Lab 04/30/23 1338 05/03/23 0800  AST 33  --   ALT 36  --   ALKPHOS 115  --   BILITOT 1.0  --   PROT 8.1  --   ALBUMIN 3.6 3.3*   No results for input(s): "LIPASE", "AMYLASE" in the last 168 hours. No results for input(s): "AMMONIA" in the last 168 hours. Coagulation Profile: No results for input(s): "INR", "PROTIME" in the last 168 hours. Cardiac Enzymes: No results for input(s): "CKTOTAL", "CKMB", "CKMBINDEX", "TROPONINI" in the last 168 hours. BNP (last 3 results) No results for input(s): "PROBNP" in the last 8760 hours. HbA1C: No results for input(s): "HGBA1C" in the last 72 hours. CBG: No results for input(s): "GLUCAP" in the last 168 hours. Lipid Profile: No results for input(s): "CHOL", "HDL", "LDLCALC", "TRIG", "CHOLHDL", "LDLDIRECT" in the last 72  hours. Thyroid Function Tests: No results for input(s): "TSH", "T4TOTAL", "FREET4", "T3FREE", "THYROIDAB" in the last 72 hours. Anemia Panel: No results for input(s): "VITAMINB12", "FOLATE", "FERRITIN", "TIBC", "IRON", "RETICCTPCT" in the last 72 hours. Most Recent Urinalysis On File:     Component Value Date/Time   COLORURINE YELLOW (A) 05/17/2015 0012   APPEARANCEUR HAZY (A) 05/17/2015 0012   LABSPEC 1.009 05/17/2015 0012   PHURINE 7.0 05/17/2015 0012   GLUCOSEU NEGATIVE 05/17/2015 0012   HGBUR 1+ (A) 05/17/2015 0012   BILIRUBINUR NEGATIVE 05/17/2015 0012   KETONESUR NEGATIVE 05/17/2015 0012   PROTEINUR 30 (A) 05/17/2015 0012   NITRITE NEGATIVE 05/17/2015 0012   LEUKOCYTESUR 3+ (A) 05/17/2015 0012   Sepsis Labs: @LABRCNTIP (procalcitonin:4,lacticidven:4) Microbiology: Recent Results (from the past 240 hour(s))  MRSA Next Gen by PCR, Nasal     Status: None   Collection Time: 05/01/23 12:30 AM   Specimen: Nasal Mucosa; Nasal Swab  Result Value Ref Range Status   MRSA by PCR Next Gen NOT DETECTED NOT DETECTED Final    Comment: (NOTE) The GeneXpert MRSA Assay (FDA approved for NASAL specimens only), is one component of a comprehensive MRSA colonization surveillance program. It is not intended to diagnose MRSA infection nor to guide or monitor treatment for MRSA infections. Test performance is not FDA approved in patients less than 63 years old. Performed at [facility], [address] [place], [place] [address]       Radiology Studies last 3 days: [facility] Chest [facility] 1 View  Result Date: 04/30/2023 CLINICAL DATA:  Shortness of breath EXAM: PORTABLE CHEST 1 VIEW COMPARISON:  Chest radiograph dated 11/03/2022 FINDINGS: Normal lung volumes. Asymmetric right perihilar opacity. Mild bilateral interstitial opacities. Dense left retrocardiac opacity. Trace blunting of the bilateral costophrenic angles. No pneumothorax. Similar enlarged cardiomediastinal silhouette. No acute  osseous abnormality. IMPRESSION: 1. Asymmetric right perihilar opacity, which may represent asymmetric pulmonary edema or infection. 2. Dense left retrocardiac opacity, which may represent atelectasis, aspiration, or pneumonia. 3. Trace blunting of the bilateral costophrenic angles, which may represent trace pleural effusions. Electronically Signed   By: Agustin Cree M.D.   On: 04/30/2023 13:57         Sunnie Nielsen, DO [facility] 05/03/2023, 3:44 PM    Dictation software may have been used to generate the above note. Typos may occur and escape review in typed/dictated notes. Please contact Dr Lyn Hollingshead directly for clarity if needed.  Staff may message me via secure chat in Epic  but this may not receive an immediate response,  please page me for urgent matters!  If 7PM-7AM, please contact night coverage [email or web address]

## 2023-05-04 DIAGNOSIS — J9601 Acute respiratory failure with hypoxia: Secondary | ICD-10-CM | POA: Diagnosis not present

## 2023-05-04 LAB — HEPATITIS B SURFACE ANTIBODY, QUANTITATIVE: Hep B S AB Quant (Post): 119 m[IU]/mL

## 2023-05-04 MED ORDER — GUAIFENESIN-DM 100-10 MG/5ML PO SYRP
5.0000 mL | ORAL_SOLUTION | ORAL | Status: DC | PRN
  Administered 2023-05-04: 5 mL via ORAL
  Filled 2023-05-04: qty 10

## 2023-05-04 MED ORDER — NICOTINE 21 MG/24HR TD PT24: 21.0000 mg | MEDICATED_PATCH | Freq: Every day | TRANSDERMAL | 0 refills | Status: DC

## 2023-05-04 MED ORDER — GUAIFENESIN-DM 100-10 MG/5ML PO SYRP: 5.0000 mL | ORAL_SOLUTION | ORAL | Status: DC | PRN

## 2023-05-04 MED ORDER — IPRATROPIUM-ALBUTEROL 0.5-2.5 (3) MG/3ML IN SOLN: 3.0000 mL | Freq: Four times a day (QID) | RESPIRATORY_TRACT | 0 refills | Status: DC | PRN

## 2023-05-04 MED ORDER — HYDRALAZINE HCL 100 MG PO TABS: 100.0000 mg | ORAL_TABLET | Freq: Three times a day (TID) | ORAL | 0 refills | Status: DC

## 2023-05-04 NOTE — Progress Notes (Signed)
EMS transported patient off the unit with all belongings. Patient safety maintained during transport. Patient denies needs at this time.

## 2023-05-04 NOTE — [place] FL2 (Signed)
[place] MEDICAID FL2 LEVEL OF CARE FORM     IDENTIFICATION  Patient Name: Todd Brown Birthdate: 12-Aug-1960 Sex: male Admission Date (Current Location): 04/30/2023  [place] and [place] Number:  [address] [id number] [facility] Facility and Address:  [facility], [address], [place], [place] [address]      Provider Number: [id number]  Attending Physician Name and Address:  Sunnie Nielsen, DO  Relative Name and Phone Number:  Sharlyne Cai [phone]    Current Level of Care: Hospital Recommended Level of Care: Assisted Living Facility Prior Approval Number:    Date Approved/Denied:   PASRR Number:    Discharge Plan:  (Assisted Living facility)    Current Diagnoses: Patient Active Problem List   Diagnosis Date Noted   Acute hypoxemic respiratory failure (HCC) 04/30/2023   Blindness of both eyes 04/30/2023   Elevated troponin 04/30/2023   Low back pain 04/30/2023   Productive cough 04/30/2023   Myocardial injury 11/03/2022   CAD (coronary artery disease) 11/03/2022   Hypothyroidism 11/03/2022   Thrombocytopenia (HCC) 11/03/2022   Aspiration pneumonia (HCC) 11/03/2022   Abdominal distension 11/03/2022   HTN (hypertension) 11/03/2022   Pericardial effusion 11/03/2022   CAP (community acquired pneumonia) 09/14/2022   Anasarca (mild pulmonary edema, pleural and pericardial effusion, ascites on CT) 09/14/2022   Acute on chronic respiratory failure (HCC) 09/14/2022   Bacteremia 06/20/2022   Acute encephalopathy 06/19/2022   SIRS (systemic inflammatory response syndrome) (HCC) 06/19/2022   NSTEMI (non-ST elevated myocardial infarction) (HCC) 06/19/2022   Acute on chronic respiratory failure with hypoxia (HCC) 05/21/2022   Hypoxia 05/20/2022   COVID-19 virus infection 05/20/2022   Hyponatremia 02/10/2022   Anemia due to chronic kidney disease 01/21/2022   Hyperkalemia 01/14/2022   Malnutrition of moderate degree 12/24/2021   Oral thrush  12/22/2021   Physical deconditioning 12/20/2021   Acute metabolic encephalopathy 12/15/2021   Essential hypertension 12/15/2021   ESRD on hemodialysis (HCC) 12/15/2021   Altered mental status 10/08/2021   Hypertensive urgency 10/07/2021   COPD (chronic obstructive pulmonary disease) (HCC) 02/26/2021   GERD (gastroesophageal reflux disease) 02/26/2021   Hypertension 11/28/2020   Acute respiratory failure with hypoxia (HCC) 10/23/2019   Anemia of chronic disease 10/23/2019   Fluid overload 10/08/2018   Acute pulmonary edema (HCC) 09/12/2018   Scrotal edema 05/10/2017   Symptomatic anemia 04/30/2017   Anticoagulated on Coumadin 09/14/2016   Cardiac arrest (HCC) 08/03/2016   Severe sepsis versus SIRS 05/26/2016   Dialysis patient (HCC) 04/15/2016   End stage renal disease (HCC) 02/12/2016   Hyperkalemia 05/20/2015   Hepatitis C 09/26/2013   Schizophrenia (HCC) 03/23/2013   Essential hypertension, benign 03/23/2013   Chronic kidney disease 03/23/2013    Orientation RESPIRATION BLADDER Height & Weight     Self, Time, Place  O2 (2L per [place]) Continent Weight: 67.5 kg Height:  6\' 2"  (188 cm)  BEHAVIORAL SYMPTOMS/MOOD NEUROLOGICAL BOWEL NUTRITION STATUS      Continent  (See Discharge Summary)  AMBULATORY STATUS COMMUNICATION OF NEEDS Skin   Limited Assist Verbally Normal                       Personal Care Assistance Level of Assistance  Bathing, Feeding, Dressing Bathing Assistance: Limited assistance Feeding assistance: Independent Dressing Assistance: Limited assistance     Functional Limitations Info  Sight, Hearing, Speech Sight Info: Impaired (Blind in both eyes) Hearing Info: Adequate Speech Info: Adequate    SPECIAL CARE FACTORS FREQUENCY  Contractures Contractures Info: Not present    Additional Factors Info  Code Status, Allergies Code Status Info: Full Code Allergies Info: Chlorpromazine           Current Medications  (05/04/2023):  This is the current hospital active medication list Current Facility-Administered Medications  Medication Dose Route Frequency Provider Last Rate Last Admin   acetaminophen (TYLENOL) tablet 650 mg  650 mg Oral Q6H PRN Cox, Amy N, DO   650 mg at 05/03/23 3086   Or   acetaminophen (TYLENOL) suppository 650 mg  650 mg Rectal Q6H PRN Cox, Amy N, DO       ascorbic acid (VITAMIN C) tablet 500 mg  500 mg Oral Daily Cox, Amy N, DO   500 mg at 05/04/23 0835   calcium acetate (PHOSLO) capsule 1,334 mg  1,334 mg Oral TID WC Cox, Amy N, DO   1,334 mg at 05/04/23 0835   carbamazepine (TEGRETOL) tablet 1,000 mg  1,000 mg Oral q morning Cox, Amy N, DO   1,000 mg at 05/04/23 5784   Chlorhexidine Gluconate Cloth 2 % PADS 6 each  6 each Topical Q0600 Cox, Amy N, DO   6 each at 05/03/23 0439   cholecalciferol (VITAMIN D3) 25 MCG (1000 UNIT) tablet 1,000 Units  1,000 Units Oral q AM Cox, Amy N, DO   1,000 Units at 05/04/23 6962   ferrous sulfate tablet 325 mg  325 mg Oral Daily Cox, Amy N, DO   325 mg at 05/04/23 0835   fluPHENAZine (PROLIXIN) tablet 2.5 mg  2.5 mg Oral Q breakfast Otelia Sergeant, RPH   2.5 mg at 05/04/23 0836   guaiFENesin-dextromethorphan (ROBITUSSIN DM) 100-10 MG/5ML syrup 5 mL  5 mL Oral Q4H PRN Sunnie Nielsen, DO   5 mL at 05/04/23 1112   heparin injection 5,000 Units  5,000 Units Subcutaneous Q8H Cox, Amy N, DO   5,000 Units at 05/04/23 9528   hydrALAZINE (APRESOLINE) tablet 100 mg  100 mg Oral Q8H Sunnie Nielsen, DO   100 mg at 05/04/23 4132   ipratropium-albuterol (DUONEB) 0.5-2.5 (3) MG/3ML nebulizer solution 3 mL  3 mL Nebulization Q6H PRN Sunnie Nielsen, DO   3 mL at 05/02/23 1049   isosorbide mononitrate (IMDUR) 24 hr tablet 60 mg  60 mg Oral BID Manuela Schwartz, NP   60 mg at 05/04/23 0835   levothyroxine (SYNTHROID) tablet 125 mcg  125 mcg Oral Q0600 Cox, Amy N, DO   125 mcg at 05/04/23 0835   lidocaine (LIDODERM) 5 % 1 patch  1 patch Transdermal Daily PRN  Cox, Amy N, DO       mometasone-formoterol (DULERA) 200-5 MCG/ACT inhaler 2 puff  2 puff Inhalation BID Cox, Amy N, DO   2 puff at 05/04/23 0836   nicotine (NICODERM CQ - dosed in mg/24 hours) patch 21 mg  21 mg Transdermal Daily Sunnie Nielsen, DO   21 mg at 05/04/23 0839   ondansetron (ZOFRAN) tablet 4 mg  4 mg Oral Q6H PRN Cox, Amy N, DO       Or   ondansetron (ZOFRAN) injection 4 mg  4 mg Intravenous Q6H PRN Cox, Amy N, DO       Oral care mouth rinse  15 mL Mouth Rinse PRN Cox, Amy N, DO       pantoprazole (PROTONIX) EC tablet 80 mg  80 mg Oral QAC breakfast Cox, Amy N, DO   80 mg at 05/04/23 0835   polyethylene glycol (MIRALAX /  GLYCOLAX) packet 17 g  17 g Oral Daily PRN Cox, Amy N, DO       trihexyphenidyl (ARTANE) tablet 2 mg  2 mg Oral BID Cox, Amy N, DO   2 mg at 05/04/23 6962   zinc sulfate (50mg  elemental zinc) capsule 220 mg  220 mg Oral Daily Cox, Amy N, DO   220 mg at 05/04/23 9528     Discharge Medications: Please see discharge summary for a list of discharge medications.  TAKE these medications     albuterol 108 (90 Base) MCG/ACT inhaler Commonly known as: VENTOLIN HFA Inhale 1-2 puffs into the lungs every 4 (four) hours as needed for wheezing or shortness of breath.    ascorbic acid 500 MG tablet Commonly known as: VITAMIN C Take 500 mg by mouth daily.    b complex-vitamin c-folic acid 0.8 MG Tabs tablet Take 1 tablet by mouth at bedtime.    calcium acetate 667 MG capsule Commonly known as: PHOSLO Take 1,334 mg by mouth 3 (three) times daily with meals. 0800/1400/2000    carbamazepine 200 MG tablet Commonly known as: TEGRETOL Take 1,000 mg by mouth every morning.    cholecalciferol 1000 units tablet Commonly known as: VITAMIN D Take 1,000 Units by mouth in the morning.    ferrous sulfate 325 (65 FE) MG tablet Take 1 tablet (325 mg total) by mouth daily.    fluPHENAZine 2.5 MG tablet Commonly known as: PROLIXIN Take 2.5 mg by mouth in the morning.     Fluticasone-Salmeterol 250-50 MCG/DOSE Aepb Commonly known as: ADVAIR Inhale 1 puff into the lungs 2 (two) times daily.    guaiFENesin-dextromethorphan 100-10 MG/5ML syrup Commonly known as: ROBITUSSIN DM Take 5 mLs by mouth every 4 (four) hours as needed for cough.    hydrALAZINE 100 MG tablet Commonly known as: APRESOLINE Take 1 tablet (100 mg total) by mouth every 8 (eight) hours.    hyoscyamine 0.125 MG SL tablet Commonly known as: LEVSIN SL Place 0.125 mg under the tongue every 4 (four) hours as needed for cramping.    ipratropium-albuterol 0.5-2.5 (3) MG/3ML Soln Commonly known as: DUONEB Take 3 mLs by nebulization every 6 (six) hours as needed.    isosorbide mononitrate 60 MG 24 hr tablet Commonly known as: IMDUR Take 60 mg by mouth 2 (two) times daily.    levothyroxine 125 MCG tablet Commonly known as: Synthroid Take 1 tablet (125 mcg total) by mouth daily.    naproxen 500 MG tablet Commonly known as: NAPROSYN Take 500 mg by mouth 2 (two) times daily as needed for mild pain.    nicotine 21 mg/24hr patch Commonly known as: NICODERM CQ - dosed in mg/24 hours Place 1 patch (21 mg total) onto the skin daily. Start taking on: May 05, 2023    omeprazole 40 MG capsule Commonly known as: PRILOSEC Take 40 mg by mouth every morning.    polyethylene glycol 17 g packet Commonly known as: MIRALAX / GLYCOLAX Take 17 g by mouth daily as needed for mild constipation.    rosuvastatin 10 MG tablet Commonly known as: CRESTOR Take 10 mg by mouth at bedtime.    traMADol 50 MG tablet Commonly known as: ULTRAM Take 50 mg by mouth every 6 (six) hours as needed.    trihexyphenidyl 2 MG tablet Commonly known as: ARTANE Take 2 mg by mouth 2 (two) times daily.    zinc sulfate (50mg  elemental zinc) 220 (50 Zn) MG capsule Take 220 mg by mouth daily.  Relevant Imaging Results:  Relevant Lab Results:   Additional Information [id number]  Garret Reddish,  RN

## 2023-05-04 NOTE — Progress Notes (Signed)
[facility]  ROUNDING NOTE   Subjective:   Todd Brown  is a 62 year old male with past medical conditions including COPD, hypertension, DVT, schizophrenia, and end-stage renal disease on hemodialysis.  Patient presents to the emergency department with combative behavior at outpatient dialysis clinic.  Patient has been admitted for Acute hypoxemic respiratory failure (HCC) [J96.01] Acute hypoxic respiratory failure (HCC) [J96.01]  Patient is known to our practice and receives outpatient dialysis treatments at [facility] on a MWF schedule, supervised by Dr. Cherylann Ratel.    Update: Patient seen sitting at side of bed Completed breakfast tray at bedside Remains on 3 L [place]   Objective:  Vital signs in last 24 hours:  Temp:  [97.3 F (36.3 C)-98 F (36.7 C)] 98 F (36.7 C) (12/03 0757) Pulse Rate:  [73-82] 82 (12/03 0757) Resp:  [18-20] 18 (12/03 0757) BP: (113-126)/(75-81) 124/81 (12/03 0757) SpO2:  [97 %-99 %] 99 % (12/03 0757)  Weight change:  Filed Weights   05/01/23 1434 05/03/23 0813 05/03/23 1226  Weight: 68.8 kg 70.9 kg 67.5 kg    Intake/Output: I/O last 3 completed shifts: In: -  Out: 3500 [Other:3500]   Intake/Output this shift:  No intake/output data recorded.  Physical Exam: General: NAD  Head: Normocephalic, atraumatic.   Eyes: Anicteric  Lungs:  Clear, NCO 2  Heart: Regular rate and rhythm  Abdomen:  Soft, nontender,   Extremities: No peripheral edema.  Neurologic: Alert  Skin: No lesions  Access: Left arm aVF    Basic Metabolic Panel: Recent Labs  Lab 04/30/23 1338 05/01/23 0457 05/03/23 0800  NA 140 139 135  135  K 5.0 5.9* 5.4*  5.4*  CL 93* 97* 93*  93*  CO2 30 27 26  26   GLUCOSE 114* 70 76  76  BUN 52* 61* 61*  61*  CREATININE 7.52* 8.49* 8.62*  8.58*  CALCIUM 9.2 9.2 9.4  9.3  MG  --   --  2.5*  PHOS  --   --  5.6*    Liver Function Tests: Recent Labs  Lab 04/30/23 1338 05/03/23 0800  AST 33  --    ALT 36  --   ALKPHOS 115  --   BILITOT 1.0  --   PROT 8.1  --   ALBUMIN 3.6 3.3*   No results for input(s): "LIPASE", "AMYLASE" in the last 168 hours. No results for input(s): "AMMONIA" in the last 168 hours.  CBC: Recent Labs  Lab 04/30/23 1338 05/01/23 0457 05/03/23 0800  WBC 7.0 6.6 6.4  HGB 13.0 11.5* 13.0  HCT 38.6* 34.3* 38.6*  MCV 102.7* 101.8* 99.5  PLT 240 209 225    Cardiac Enzymes: No results for input(s): "CKTOTAL", "CKMB", "CKMBINDEX", "TROPONINI" in the last 168 hours.  BNP: Invalid input(s): "POCBNP"  CBG: No results for input(s): "GLUCAP" in the last 168 hours.  Microbiology: Results for orders placed or performed during the hospital encounter of 04/30/23  MRSA Next Gen by PCR, Nasal     Status: None   Collection Time: 05/01/23 12:30 AM   Specimen: Nasal Mucosa; Nasal Swab  Result Value Ref Range Status   MRSA by PCR Next Gen NOT DETECTED NOT DETECTED Final    Comment: (NOTE) The GeneXpert MRSA Assay (FDA approved for NASAL specimens only), is one component of a comprehensive MRSA colonization surveillance program. It is not intended to diagnose MRSA infection nor to guide or monitor treatment for MRSA infections. Test performance is not FDA approved in  patients less than 64 years old. Performed at [facility], [address] [place], [place] [address]     Coagulation Studies: No results for input(s): "LABPROT", "INR" in the last 72 hours.  Urinalysis: No results for input(s): "COLORURINE", "LABSPEC", "PHURINE", "GLUCOSEU", "HGBUR", "BILIRUBINUR", "KETONESUR", "PROTEINUR", "UROBILINOGEN", "NITRITE", "LEUKOCYTESUR" in the last 72 hours.  Invalid input(s): "APPERANCEUR"    Imaging: No results found.   Medications:      ascorbic acid  500 mg Oral Daily   calcium acetate  1,334 mg Oral TID WC   carbamazepine  1,000 mg Oral q morning   Chlorhexidine Gluconate Cloth  6 each Topical Q0600   cholecalciferol  1,000 Units Oral  q AM   ferrous sulfate  325 mg Oral Daily   fluPHENAZine  2.5 mg Oral Q breakfast   heparin  5,000 Units Subcutaneous Q8H   hydrALAZINE  100 mg Oral Q8H   isosorbide mononitrate  60 mg Oral BID   levothyroxine  125 mcg Oral Q0600   mometasone-formoterol  2 puff Inhalation BID   nicotine  21 mg Transdermal Daily   pantoprazole  80 mg Oral QAC breakfast   trihexyphenidyl  2 mg Oral BID   zinc sulfate (50mg  elemental zinc)  220 mg Oral Daily   acetaminophen **OR** acetaminophen, guaiFENesin-dextromethorphan, ipratropium-albuterol, lidocaine, ondansetron **OR** ondansetron (ZOFRAN) IV, mouth rinse, polyethylene glycol  Assessment/ Plan:  [name] is a 62 y.o.  male with past medical conditions including COPD, hypertension, DVT, schizophrenia, and end-stage renal disease on hemodialysis.  Patient presents to the emergency department with combative behavior at outpatient dialysis clinic.  Patient has been admitted for Acute hypoxemic respiratory failure (HCC) [J96.01] Acute hypoxic respiratory failure (HCC) [J96.01]   [facility]/MWF/left aVF   End-stage renal disease on hemodialysis.    2.  Hyperkalemia, potassium 5.4 today. Will provide dialysis on a 2K bath today for management.   3. Anemia of chronic kidney disease Lab Results  Component Value Date   HGB 13.0 05/03/2023    Hemoglobin at goal.  Patient does receive Mircera at outpatient clinic.  No need for ESA during this admission.   4. Secondary Hyperparathyroidism: with outpatient labs: PTH 730, phosphorus 5.4, calcium 9.2 on 04/21/23.   Lab Results  Component Value Date   PTH 194 (H) 05/11/2017   CALCIUM 9.3 05/03/2023   CALCIUM 9.4 05/03/2023   CAION 1.19 03/27/2022   PHOS 5.6 (H) 05/03/2023    Will continue to monitor bone minerals during this admission.   5.  Acute hypoxic respiratory failure, differential diagnosis includes CAP versus asymmetric pulmonary edema.  Respiratory status has improved  with dialysis.    LOS: 4 Todd Brown 12/3/20241:21 PM

## 2023-05-04 NOTE — TOC Transition Note (Signed)
Transition of Care [facility]) - CM/SW Discharge Note   Patient Details  Name: Todd Brown MRN: [id number] Date of Birth: 07/01/1960  [facility] CM/SW Contact:  Garret Reddish, RN Phone Number: 05/04/2023, 4:43 PM   Clinical Narrative:    Chart reviewed.  Noted that patient has orders for discharge today.    I have spoken with Sharlyne Cai with DSS.  She informs me that she is patient's guardian.  She reports that patient is a resident of [facility] facility.  Milinda Pointer is agreeable for patient to return to [facility] facility.    I have spoken with Mosie Epstein of [facility].  He informs me that patient is able to return to the facility.  Doristine Mango reports the number to call report is [phone].  Doristine Mango has requested patient be transported via EMS due to the oxygen.    I have requested [facility] to transport patient to the facility today.   I have informed staff nurse of the above information.      Final next level of care: Assisted Living Barriers to Discharge: No Barriers Identified   Patient Goals and CMS Choice CMS Medicare.gov Compare Post Acute Care list provided to:: Legal Guardian Sharlyne Cai)    Discharge Placement                Patient chooses bed at:  (Patient is a current resident at [facility]) Patient to be transferred to facility by: [facility] Name of family member notified: Sharlyne Cai Patient and family notified of of transfer: 05/04/23  Discharge Plan and Services Additional resources added to the After Visit Summary for                                       Social Determinants of Health (SDOH) Interventions SDOH Screenings   Food Insecurity: Patient Unable To Answer (05/01/2023)  Housing: Patient Unable To Answer (05/01/2023)  Transportation Needs: Patient Unable To Answer (05/01/2023)  Utilities:  Patient Unable To Answer (05/01/2023)  Alcohol Screen: Low Risk  (10/05/2019)  Financial Resource Strain: Low Risk  (10/08/2018)  Physical Activity: Unknown (10/08/2018)  Social Connections: Unknown (10/08/2018)  Stress: No Stress Concern Present (10/08/2018)  Tobacco Use: Low Risk  (04/30/2023)     Readmission Risk Interventions    11/05/2022    4:39 PM 09/17/2022   12:00 PM  Readmission Risk Prevention Plan  Transportation Screening Complete Complete  Medication Review Ocean[employer]) Complete Complete  HRI or Home Care Consult Complete Complete  SW Recovery Care/Counseling Consult Complete   Skilled Nursing Facility Not Applicable Not Applicable

## 2023-05-04 NOTE — Discharge Summary (Signed)
Physician Discharge Summary   Patient: Todd Brown MRN: [id number]  DOB: 04/18/1961   Admit:     Date of Admission: 04/30/2023 Admitted from: assisted living   Discharge: Date of discharge: 05/04/23 Disposition: Assisted living Condition at discharge: good  CODE STATUS: FULL CODE     Discharge Physician: Sunnie Nielsen, DO [facility]     PCP: Smiley Houseman, NP  Recommendations for Outpatient Follow-up:  Follow up with PCP Smiley Houseman, NP in 1-2 weeks for hospital follow up  Dialysis as directed    Discharge Instructions     (HEART FAILURE PATIENTS) Call MD:  Anytime you have any of the following symptoms: 1) 3 pound weight gain in 24 hours or 5 pounds in 1 week 2) shortness of breath, with or without a dry hacking cough 3) swelling in the hands, feet or stomach 4) if you have to sleep on extra pillows at night in order to breathe.   Complete by: As directed    Diet - low sodium heart healthy   Complete by: As directed    Increase activity slowly   Complete by: As directed          Discharge Diagnoses: Principal Problem:   Acute hypoxemic respiratory failure (HCC) Active Problems:   Acute pulmonary edema (HCC)   Acute encephalopathy   ESRD on hemodialysis (HCC)   CAD (coronary artery disease)   Physical deconditioning   Hypothyroidism   Schizophrenia (HCC)   Dialysis patient (HCC)   Hypertension   GERD (gastroesophageal reflux disease)   Blindness of both eyes   Elevated troponin   Low back pain   Productive cough       Hospital Course: HPI: Todd Brown is a 62 year old male with history of end-stage renal disease on hemodialysis, schizophrenia, hypertension, COPD, who presents to the emergency department for chief concerns of agitation, and hypoxic respiratory failure at hemodialysis center.Per ED report, patient SpO2 was in the 80s on room air at dialysis.  Patient received 54 minutes of hemodialysis with 735 mm of  fluid removal.  Patient is a ward of the state.  Hospital course / significant events:  11/29: admitted to hospitalist service for acute hypoxic respiratory failure d/t pulmonary edema, HTN urgency 11/30: 3.5L out in HD  12/01: stable, given significant fluid off in HD yesterday, nephrology recs for repeat HD tomorrow   12/02: HD again 3.5L off. TOC to confirm placement / housing in anticipation discharge  12/03" discharge back to assisted living   Consultants:  nephrology  Procedures/Surgeries: none      ASSESSMENT & PLAN:   Acute hypoxemic respiratory failure 2/2 Pulm edema from volume overload Have reasonably ruled out pneumonia  HD for volume removal No abx  supplemental O2 to keep sats >=92%, wean as tolerated   Hypertensive urgency likely due to volume overload, and may have contributed to pulm edema. oral hydralazine 100 q8h cont Imdur He has been periodically refusing medications    Acute encephalopathy uncertain etiology, va sgitation d/t underlying schizophrenia w/ schizoaffective d/o - resolved / baseline  pt was noted to be combative, however, unclear whether it's acute encephalopathy or baseline behavior with underlying psych issues.  Pt was more calm today. Monitor  Of note, underlying psychiatric issues, he is ward of the state  cont home Fluphenazine 2.5 mg every morning, Carbamazepine 1000 mg every morning and trihexyphenidyl 2 mg p.o. twice daily    ESRD on hemodialysis  HD per nephrology  Hypothyroidism Levothyroxine 125 mcg daily    Physical deconditioning Fall precautions  Productive cough Guaifenesin 5 mL p.o. every 4 hours as needed for cough and to loosen phlegm, 3 days ordered   Low back pain Patient states his pain has been there for a long time PDMP reviewed, no active pain medication ordered at this time Home naproxen 500 mg p.o. twice daily as needed for mild pain resumed on admission Lidocaine patch, every 12 hours daily as needed  for pain ordered on admission   Elevated troponin not clinically significant  Demand ischemia in setting of acute hypoxic respiratory failure and hypertensive urgency as well as ESRD. trop 29, 37 Monitor as needed w/ chest pain   Blindness of both eyes Baseline, 'for a while' per patient           Discharge Instructions  Allergies as of 05/04/2023       Reactions   Chlorpromazine Other (See Comments)   Reaction:  Unknown , pt states it makes him feel real bad Reaction:  Unknown , pt states it makes him feel real bad        Medication List     STOP taking these medications    amoxicillin-clavulanate 500-125 MG tablet Commonly known as: AUGMENTIN   doxazosin 4 MG tablet Commonly known as: CARDURA   hydrOXYzine 10 MG tablet Commonly known as: ATARAX   LORazepam 0.5 MG tablet Commonly known as: ATIVAN   morphine 20 MG/ML concentrated solution Commonly known as: ROXANOL   promethazine-dextromethorphan 6.25-15 MG/5ML syrup Commonly known as: PROMETHAZINE-DM       TAKE these medications    albuterol 108 (90 Base) MCG/ACT inhaler Commonly known as: VENTOLIN HFA Inhale 1-2 puffs into the lungs every 4 (four) hours as needed for wheezing or shortness of breath.   ascorbic acid 500 MG tablet Commonly known as: VITAMIN C Take 500 mg by mouth daily.   b complex-vitamin c-folic acid 0.8 MG Tabs tablet Take 1 tablet by mouth at bedtime.   calcium acetate 667 MG capsule Commonly known as: PHOSLO Take 1,334 mg by mouth 3 (three) times daily with meals. 0800/1400/2000   carbamazepine 200 MG tablet Commonly known as: TEGRETOL Take 1,000 mg by mouth every morning.   cholecalciferol 1000 units tablet Commonly known as: VITAMIN D Take 1,000 Units by mouth in the morning.   ferrous sulfate 325 (65 FE) MG tablet Take 1 tablet (325 mg total) by mouth daily.   fluPHENAZine 2.5 MG tablet Commonly known as: PROLIXIN Take 2.5 mg by mouth in the morning.    Fluticasone-Salmeterol 250-50 MCG/DOSE Aepb Commonly known as: ADVAIR Inhale 1 puff into the lungs 2 (two) times daily.   guaiFENesin-dextromethorphan 100-10 MG/5ML syrup Commonly known as: ROBITUSSIN DM Take 5 mLs by mouth every 4 (four) hours as needed for cough.   hydrALAZINE 100 MG tablet Commonly known as: APRESOLINE Take 1 tablet (100 mg total) by mouth every 8 (eight) hours.   hyoscyamine 0.125 MG SL tablet Commonly known as: LEVSIN SL Place 0.125 mg under the tongue every 4 (four) hours as needed for cramping.   ipratropium-albuterol 0.5-2.5 (3) MG/3ML Soln Commonly known as: DUONEB Take 3 mLs by nebulization every 6 (six) hours as needed.   isosorbide mononitrate 60 MG 24 hr tablet Commonly known as: IMDUR Take 60 mg by mouth 2 (two) times daily.   levothyroxine 125 MCG tablet Commonly known as: Synthroid Take 1 tablet (125 mcg total) by mouth daily.   naproxen 500 MG  tablet Commonly known as: NAPROSYN Take 500 mg by mouth 2 (two) times daily as needed for mild pain.   nicotine 21 mg/24hr patch Commonly known as: NICODERM CQ - dosed in mg/24 hours Place 1 patch (21 mg total) onto the skin daily. Start taking on: May 05, 2023   omeprazole 40 MG capsule Commonly known as: PRILOSEC Take 40 mg by mouth every morning.   polyethylene glycol 17 g packet Commonly known as: MIRALAX / GLYCOLAX Take 17 g by mouth daily as needed for mild constipation.   rosuvastatin 10 MG tablet Commonly known as: CRESTOR Take 10 mg by mouth at bedtime.   traMADol 50 MG tablet Commonly known as: ULTRAM Take 50 mg by mouth every 6 (six) hours as needed.   trihexyphenidyl 2 MG tablet Commonly known as: ARTANE Take 2 mg by mouth 2 (two) times daily.   zinc sulfate (50mg  elemental zinc) 220 (50 Zn) MG capsule Take 220 mg by mouth daily.         Follow-up Information     Smiley Houseman, NP Follow up.   Specialty: Family Medicine Why: Hospital follow up Contact  information: [address] [place] [address] [phone]                 Allergies  Allergen Reactions   Chlorpromazine Other (See Comments)    Reaction:  Unknown , pt states it makes him feel real bad Reaction:  Unknown , pt states it makes him feel real bad      Subjective: pt has no concerns this morning other than would like some more coffee. Denies pain.    Discharge Exam: BP 124/81 (BP Location: Right Arm)   Pulse 82   Temp 98 F (36.7 C)   Resp 18   Ht 6\' 2"  (1.88 m)   Wt 67.5 kg   SpO2 99%   BMI 19.11 kg/m  General: Pt is alert, awake, not in acute distress Cardiovascular: RRR, S1/S2 +, no rubs, no gallops Respiratory: CTA bilaterally, no wheezing, no rhonchi Abdominal: Soft, NT, ND, bowel sounds Extremities: no edema, no cyanosis     The results of significant diagnostics from this hospitalization (including imaging, microbiology, ancillary and laboratory) are listed below for reference.     Microbiology: Recent Results (from the past 240 hour(s))  MRSA Next Gen by PCR, Nasal     Status: None   Collection Time: 05/01/23 12:30 AM   Specimen: Nasal Mucosa; Nasal Swab  Result Value Ref Range Status   MRSA by PCR Next Gen NOT DETECTED NOT DETECTED Final    Comment: (NOTE) The GeneXpert MRSA Assay (FDA approved for NASAL specimens only), is one component of a comprehensive MRSA colonization surveillance program. It is not intended to diagnose MRSA infection nor to guide or monitor treatment for MRSA infections. Test performance is not FDA approved in patients less than 65 years old. Performed at [facility], [address] [place], [place] [address]      Labs: BNP (last 3 results) Recent Labs    11/03/22 0936  BNP 388.1*   Basic Metabolic Panel: Recent Labs  Lab 04/30/23 1338 05/01/23 0457 05/03/23 0800  NA 140 139 135  135  K 5.0 5.9* 5.4*  5.4*  CL 93* 97* 93*  93*  CO2 30 27 26  26   GLUCOSE 114* 70 76   76  BUN 52* 61* 61*  61*  CREATININE 7.52* 8.49* 8.62*  8.58*  CALCIUM 9.2 9.2 9.4  9.3  MG  --   --  2.5*  PHOS  --   --  5.6*   Liver Function Tests: Recent Labs  Lab 04/30/23 1338 05/03/23 0800  AST 33  --   ALT 36  --   ALKPHOS 115  --   BILITOT 1.0  --   PROT 8.1  --   ALBUMIN 3.6 3.3*   No results for input(s): "LIPASE", "AMYLASE" in the last 168 hours. No results for input(s): "AMMONIA" in the last 168 hours. CBC: Recent Labs  Lab 04/30/23 1338 05/01/23 0457 05/03/23 0800  WBC 7.0 6.6 6.4  HGB 13.0 11.5* 13.0  HCT 38.6* 34.3* 38.6*  MCV 102.7* 101.8* 99.5  PLT 240 209 225   Cardiac Enzymes: No results for input(s): "CKTOTAL", "CKMB", "CKMBINDEX", "TROPONINI" in the last 168 hours. BNP: Invalid input(s): "POCBNP" CBG: No results for input(s): "GLUCAP" in the last 168 hours. D-Dimer No results for input(s): "DDIMER" in the last 72 hours. Hgb A1c No results for input(s): "HGBA1C" in the last 72 hours. Lipid Profile No results for input(s): "CHOL", "HDL", "LDLCALC", "TRIG", "CHOLHDL", "LDLDIRECT" in the last 72 hours. Thyroid function studies No results for input(s): "TSH", "T4TOTAL", "T3FREE", "THYROIDAB" in the last 72 hours.  Invalid input(s): "FREET3" Anemia work up No results for input(s): "VITAMINB12", "FOLATE", "FERRITIN", "TIBC", "IRON", "RETICCTPCT" in the last 72 hours. Urinalysis    Component Value Date/Time   COLORURINE YELLOW (A) 05/17/2015 0012   APPEARANCEUR HAZY (A) 05/17/2015 0012   LABSPEC 1.009 05/17/2015 0012   PHURINE 7.0 05/17/2015 0012   GLUCOSEU NEGATIVE 05/17/2015 0012   HGBUR 1+ (A) 05/17/2015 0012   BILIRUBINUR NEGATIVE 05/17/2015 0012   KETONESUR NEGATIVE 05/17/2015 0012   PROTEINUR 30 (A) 05/17/2015 0012   NITRITE NEGATIVE 05/17/2015 0012   LEUKOCYTESUR 3+ (A) 05/17/2015 0012   Sepsis Labs Recent Labs  Lab 04/30/23 1338 05/01/23 0457 05/03/23 0800  WBC 7.0 6.6 6.4   Microbiology Recent Results (from the past  240 hour(s))  MRSA Next Gen by PCR, Nasal     Status: None   Collection Time: 05/01/23 12:30 AM   Specimen: Nasal Mucosa; Nasal Swab  Result Value Ref Range Status   MRSA by PCR Next Gen NOT DETECTED NOT DETECTED Final    Comment: (NOTE) The GeneXpert MRSA Assay (FDA approved for NASAL specimens only), is one component of a comprehensive MRSA colonization surveillance program. It is not intended to diagnose MRSA infection nor to guide or monitor treatment for MRSA infections. Test performance is not FDA approved in patients less than 66 years old. Performed at [facility], [address] [place], [place] [address]    Imaging DG Chest [name] 1 View  Result Date: 04/30/2023 CLINICAL DATA:  Shortness of breath EXAM: PORTABLE CHEST 1 VIEW COMPARISON:  Chest radiograph dated 11/03/2022 FINDINGS: Normal lung volumes. Asymmetric right perihilar opacity. Mild bilateral interstitial opacities. Dense left retrocardiac opacity. Trace blunting of the bilateral costophrenic angles. No pneumothorax. Similar enlarged cardiomediastinal silhouette. No acute osseous abnormality. IMPRESSION: 1. Asymmetric right perihilar opacity, which may represent asymmetric pulmonary edema or infection. 2. Dense left retrocardiac opacity, which may represent atelectasis, aspiration, or pneumonia. 3. Trace blunting of the bilateral costophrenic angles, which may represent trace pleural effusions. Electronically Signed   By: Agustin Cree M.D.   On: 04/30/2023 13:57      Time coordinating discharge: over 30 minutes  SIGNED:  Sunnie Nielsen DO [facility]

## 2023-06-16 ENCOUNTER — Encounter: Payer: Self-pay | Admitting: Emergency Medicine

## 2023-06-16 ENCOUNTER — Emergency Department: Payer: MEDICAID

## 2023-06-16 ENCOUNTER — Other Ambulatory Visit: Payer: Self-pay

## 2023-06-16 ENCOUNTER — Inpatient Hospital Stay
Admission: EM | Admit: 2023-06-16 | Discharge: 2023-06-19 | DRG: 193 | Disposition: A | Discharge status: Nursing Home (Includes ICF) | Payer: MEDICAID | Attending: Internal Medicine | Admitting: Internal Medicine

## 2023-06-16 DIAGNOSIS — H543 Unqualified visual loss, both eyes: Secondary | ICD-10-CM | POA: Diagnosis present

## 2023-06-16 DIAGNOSIS — I10 Essential (primary) hypertension: Secondary | ICD-10-CM | POA: Diagnosis not present

## 2023-06-16 DIAGNOSIS — Z7951 Long term (current) use of inhaled steroids: Secondary | ICD-10-CM

## 2023-06-16 DIAGNOSIS — Z841 Family history of disorders of kidney and ureter: Secondary | ICD-10-CM

## 2023-06-16 DIAGNOSIS — J441 Chronic obstructive pulmonary disease with (acute) exacerbation: Secondary | ICD-10-CM | POA: Diagnosis present

## 2023-06-16 DIAGNOSIS — J449 Chronic obstructive pulmonary disease, unspecified: Secondary | ICD-10-CM | POA: Diagnosis not present

## 2023-06-16 DIAGNOSIS — F79 Unspecified intellectual disabilities: Secondary | ICD-10-CM | POA: Diagnosis present

## 2023-06-16 DIAGNOSIS — I2510 Atherosclerotic heart disease of native coronary artery without angina pectoris: Secondary | ICD-10-CM | POA: Diagnosis present

## 2023-06-16 DIAGNOSIS — J101 Influenza due to other identified influenza virus with other respiratory manifestations: Secondary | ICD-10-CM | POA: Diagnosis present

## 2023-06-16 DIAGNOSIS — Z79890 Hormone replacement therapy (postmenopausal): Secondary | ICD-10-CM

## 2023-06-16 DIAGNOSIS — E039 Hypothyroidism, unspecified: Secondary | ICD-10-CM | POA: Diagnosis present

## 2023-06-16 DIAGNOSIS — F209 Schizophrenia, unspecified: Secondary | ICD-10-CM | POA: Diagnosis present

## 2023-06-16 DIAGNOSIS — D631 Anemia in chronic kidney disease: Secondary | ICD-10-CM | POA: Diagnosis present

## 2023-06-16 DIAGNOSIS — Z992 Dependence on renal dialysis: Secondary | ICD-10-CM

## 2023-06-16 DIAGNOSIS — I159 Secondary hypertension, unspecified: Secondary | ICD-10-CM

## 2023-06-16 DIAGNOSIS — E785 Hyperlipidemia, unspecified: Secondary | ICD-10-CM | POA: Diagnosis present

## 2023-06-16 DIAGNOSIS — Z9981 Dependence on supplemental oxygen: Secondary | ICD-10-CM

## 2023-06-16 DIAGNOSIS — K219 Gastro-esophageal reflux disease without esophagitis: Secondary | ICD-10-CM | POA: Diagnosis present

## 2023-06-16 DIAGNOSIS — N186 End stage renal disease: Secondary | ICD-10-CM | POA: Diagnosis present

## 2023-06-16 DIAGNOSIS — Z86718 Personal history of other venous thrombosis and embolism: Secondary | ICD-10-CM | POA: Diagnosis not present

## 2023-06-16 DIAGNOSIS — Z1152 Encounter for screening for COVID-19: Secondary | ICD-10-CM | POA: Diagnosis not present

## 2023-06-16 DIAGNOSIS — Z8674 Personal history of sudden cardiac arrest: Secondary | ICD-10-CM | POA: Diagnosis not present

## 2023-06-16 DIAGNOSIS — F203 Undifferentiated schizophrenia: Secondary | ICD-10-CM | POA: Diagnosis not present

## 2023-06-16 DIAGNOSIS — Z515 Encounter for palliative care: Secondary | ICD-10-CM | POA: Diagnosis not present

## 2023-06-16 DIAGNOSIS — N2581 Secondary hyperparathyroidism of renal origin: Secondary | ICD-10-CM | POA: Diagnosis present

## 2023-06-16 DIAGNOSIS — Z79899 Other long term (current) drug therapy: Secondary | ICD-10-CM | POA: Diagnosis not present

## 2023-06-16 DIAGNOSIS — I120 Hypertensive chronic kidney disease with stage 5 chronic kidney disease or end stage renal disease: Secondary | ICD-10-CM | POA: Diagnosis present

## 2023-06-16 LAB — COMPREHENSIVE METABOLIC PANEL
ALT: 22 U/L (ref 0–44)
AST: 38 U/L (ref 15–41)
Albumin: 4.2 g/dL (ref 3.5–5.0)
Alkaline Phosphatase: 99 U/L (ref 38–126)
Anion gap: 18 — ABNORMAL HIGH (ref 5–15)
BUN: 22 mg/dL (ref 8–23)
CO2: 27 mmol/L (ref 22–32)
Calcium: 9.7 mg/dL (ref 8.9–10.3)
Chloride: 89 mmol/L — ABNORMAL LOW (ref 98–111)
Creatinine, Ser: 5.13 mg/dL — ABNORMAL HIGH (ref 0.61–1.24)
GFR, Estimated: 12 mL/min — ABNORMAL LOW (ref >60)
Glucose, Bld: 69 mg/dL — ABNORMAL LOW (ref 70–99)
Potassium: 3.9 mmol/L (ref 3.5–5.1)
Sodium: 134 mmol/L — ABNORMAL LOW (ref 135–145)
Total Bilirubin: 0.9 mg/dL (ref 0.0–1.2)
Total Protein: 9.1 g/dL — ABNORMAL HIGH (ref 6.5–8.1)

## 2023-06-16 LAB — BRAIN NATRIURETIC PEPTIDE: B Natriuretic Peptide: 209.6 pg/mL — ABNORMAL HIGH (ref 0.0–100.0)

## 2023-06-16 LAB — CBC WITH DIFFERENTIAL/PLATELET
Abs Immature Granulocytes: 0.04 10*3/uL (ref 0.00–0.07)
Basophils Absolute: 0 10*3/uL (ref 0.0–0.1)
Basophils Relative: 1 %
Eosinophils Absolute: 0.1 10*3/uL (ref 0.0–0.5)
Eosinophils Relative: 1 %
HCT: 43.4 % (ref 39.0–52.0)
Hemoglobin: 14.3 g/dL (ref 13.0–17.0)
Immature Granulocytes: 1 %
Lymphocytes Relative: 5 %
Lymphs Abs: 0.4 10*3/uL — ABNORMAL LOW (ref 0.7–4.0)
MCH: 34.8 pg — ABNORMAL HIGH (ref 26.0–34.0)
MCHC: 32.9 g/dL (ref 30.0–36.0)
MCV: 105.6 fL — ABNORMAL HIGH (ref 80.0–100.0)
Monocytes Absolute: 1 10*3/uL (ref 0.1–1.0)
Monocytes Relative: 12 %
Neutro Abs: 7 10*3/uL (ref 1.7–7.7)
Neutrophils Relative %: 80 %
Platelets: 231 10*3/uL (ref 150–400)
RBC: 4.11 MIL/uL — ABNORMAL LOW (ref 4.22–5.81)
RDW: 13.7 % (ref 11.5–15.5)
WBC: 8.6 10*3/uL (ref 4.0–10.5)
nRBC: 0 % (ref 0.0–0.2)

## 2023-06-16 LAB — CBG MONITORING, ED: Glucose-Capillary: 70 mg/dL (ref 70–99)

## 2023-06-16 LAB — RESP PANEL BY RT-PCR (RSV, FLU A&B, COVID)  RVPGX2
Influenza A by PCR: POSITIVE — AB
Influenza B by PCR: NEGATIVE
Resp Syncytial Virus by PCR: NEGATIVE
SARS Coronavirus 2 by RT PCR: NEGATIVE

## 2023-06-16 LAB — TROPONIN I (HIGH SENSITIVITY)
Troponin I (High Sensitivity): 23 ng/L — ABNORMAL HIGH (ref <18)
Troponin I (High Sensitivity): 25 ng/L — ABNORMAL HIGH (ref <18)

## 2023-06-16 MED ORDER — TRAMADOL HCL 50 MG PO TABS: 50.0000 mg | ORAL_TABLET | Freq: Four times a day (QID) | ORAL | Status: DC | PRN

## 2023-06-16 MED ORDER — ALBUTEROL SULFATE (2.5 MG/3ML) 0.083% IN NEBU
2.5000 mg | INHALATION_SOLUTION | RESPIRATORY_TRACT | Status: DC | PRN
  Administered 2023-06-19 (×2): 2.5 mg via RESPIRATORY_TRACT
  Filled 2023-06-16 (×2): qty 3

## 2023-06-16 MED ORDER — OSELTAMIVIR PHOSPHATE 30 MG PO CAPS
30.0000 mg | ORAL_CAPSULE | ORAL | Status: DC
  Administered 2023-06-18: 30 mg via ORAL
  Filled 2023-06-16: qty 1

## 2023-06-16 MED ORDER — SODIUM CHLORIDE 0.9 % IV SOLN
500.0000 mg | Freq: Once | INTRAVENOUS | Status: AC
  Administered 2023-06-16: 500 mg via INTRAVENOUS
  Filled 2023-06-16: qty 5

## 2023-06-16 MED ORDER — HYDRALAZINE HCL 20 MG/ML IJ SOLN: 5.0000 mg | INTRAMUSCULAR | Status: DC | PRN

## 2023-06-16 MED ORDER — HYDRALAZINE HCL 50 MG PO TABS
100.0000 mg | ORAL_TABLET | Freq: Three times a day (TID) | ORAL | Status: DC
  Administered 2023-06-16 – 2023-06-19 (×8): 100 mg via ORAL
  Filled 2023-06-16 (×8): qty 2

## 2023-06-16 MED ORDER — FERROUS SULFATE 325 (65 FE) MG PO TABS
325.0000 mg | ORAL_TABLET | Freq: Every day | ORAL | Status: DC
  Administered 2023-06-17 – 2023-06-19 (×3): 325 mg via ORAL
  Filled 2023-06-16 (×3): qty 1

## 2023-06-16 MED ORDER — DM-GUAIFENESIN ER 30-600 MG PO TB12
1.0000 | ORAL_TABLET | Freq: Two times a day (BID) | ORAL | Status: DC | PRN
  Administered 2023-06-19: 1 via ORAL
  Filled 2023-06-16: qty 1

## 2023-06-16 MED ORDER — DIPHENHYDRAMINE HCL 50 MG/ML IJ SOLN: 12.5000 mg | Freq: Three times a day (TID) | INTRAMUSCULAR | Status: DC | PRN

## 2023-06-16 MED ORDER — RENA-VITE PO TABS
1.0000 | ORAL_TABLET | Freq: Every day | ORAL | Status: DC
  Administered 2023-06-16 – 2023-06-18 (×3): 1 via ORAL
  Filled 2023-06-16 (×3): qty 1

## 2023-06-16 MED ORDER — LEVOTHYROXINE SODIUM 50 MCG PO TABS
125.0000 ug | ORAL_TABLET | Freq: Every day | ORAL | Status: DC
  Administered 2023-06-17 – 2023-06-19 (×3): 125 ug via ORAL
  Filled 2023-06-16: qty 3
  Filled 2023-06-16: qty 1
  Filled 2023-06-16: qty 3

## 2023-06-16 MED ORDER — POLYETHYLENE GLYCOL 3350 17 G PO PACK: 17.0000 g | PACK | Freq: Every day | ORAL | Status: DC | PRN

## 2023-06-16 MED ORDER — NICOTINE 21 MG/24HR TD PT24
21.0000 mg | MEDICATED_PATCH | Freq: Every day | TRANSDERMAL | Status: DC
  Administered 2023-06-17 – 2023-06-19 (×3): 21 mg via TRANSDERMAL
  Filled 2023-06-16 (×3): qty 1

## 2023-06-16 MED ORDER — METHYLPREDNISOLONE SODIUM SUCC 125 MG IJ SOLR
125.0000 mg | Freq: Once | INTRAMUSCULAR | Status: AC
  Administered 2023-06-16: 125 mg via INTRAVENOUS
  Filled 2023-06-16: qty 2

## 2023-06-16 MED ORDER — IPRATROPIUM-ALBUTEROL 0.5-2.5 (3) MG/3ML IN SOLN
6.0000 mL | Freq: Once | RESPIRATORY_TRACT | Status: AC
  Administered 2023-06-16: 6 mL via RESPIRATORY_TRACT
  Filled 2023-06-16: qty 3

## 2023-06-16 MED ORDER — ROSUVASTATIN CALCIUM 10 MG PO TABS
10.0000 mg | ORAL_TABLET | Freq: Every day | ORAL | Status: DC
  Administered 2023-06-16 – 2023-06-18 (×3): 10 mg via ORAL
  Filled 2023-06-16 (×3): qty 1

## 2023-06-16 MED ORDER — PANTOPRAZOLE SODIUM 40 MG PO TBEC
40.0000 mg | DELAYED_RELEASE_TABLET | Freq: Every day | ORAL | Status: DC
  Administered 2023-06-16 – 2023-06-19 (×4): 40 mg via ORAL
  Filled 2023-06-16 (×4): qty 1

## 2023-06-16 MED ORDER — CALCIUM ACETATE (PHOS BINDER) 667 MG PO CAPS
1334.0000 mg | ORAL_CAPSULE | Freq: Three times a day (TID) | ORAL | Status: DC
  Administered 2023-06-17 – 2023-06-19 (×9): 1334 mg via ORAL
  Filled 2023-06-16 (×11): qty 2

## 2023-06-16 MED ORDER — VITAMIN D 25 MCG (1000 UNIT) PO TABS
1000.0000 [IU] | ORAL_TABLET | Freq: Every morning | ORAL | Status: DC
  Administered 2023-06-16 – 2023-06-19 (×4): 1000 [IU] via ORAL
  Filled 2023-06-16 (×4): qty 1

## 2023-06-16 MED ORDER — VANCOMYCIN HCL IN DEXTROSE 1-5 GM/200ML-% IV SOLN
1000.0000 mg | Freq: Once | INTRAVENOUS | Status: AC
  Administered 2023-06-16: 1000 mg via INTRAVENOUS
  Filled 2023-06-16: qty 200

## 2023-06-16 MED ORDER — TRIHEXYPHENIDYL HCL 2 MG PO TABS
2.0000 mg | ORAL_TABLET | Freq: Two times a day (BID) | ORAL | Status: DC
  Administered 2023-06-16 – 2023-06-19 (×6): 2 mg via ORAL
  Filled 2023-06-16 (×9): qty 1

## 2023-06-16 MED ORDER — MOMETASONE FURO-FORMOTEROL FUM 200-5 MCG/ACT IN AERO
2.0000 | INHALATION_SPRAY | Freq: Two times a day (BID) | RESPIRATORY_TRACT | Status: DC
  Administered 2023-06-17 – 2023-06-19 (×5): 2 via RESPIRATORY_TRACT
  Filled 2023-06-16: qty 8.8

## 2023-06-16 MED ORDER — ZINC SULFATE 220 (50 ZN) MG PO CAPS
220.0000 mg | ORAL_CAPSULE | Freq: Every day | ORAL | Status: DC
  Administered 2023-06-16 – 2023-06-19 (×4): 220 mg via ORAL
  Filled 2023-06-16 (×4): qty 1

## 2023-06-16 MED ORDER — CARBAMAZEPINE 200 MG PO TABS
1000.0000 mg | ORAL_TABLET | Freq: Every morning | ORAL | Status: DC
  Administered 2023-06-16 – 2023-06-19 (×3): 1000 mg via ORAL
  Filled 2023-06-16 (×3): qty 5

## 2023-06-16 MED ORDER — HEPARIN SODIUM (PORCINE) 5000 UNIT/ML IJ SOLN
5000.0000 [IU] | Freq: Three times a day (TID) | INTRAMUSCULAR | Status: DC
  Administered 2023-06-16 – 2023-06-19 (×8): 5000 [IU] via SUBCUTANEOUS
  Filled 2023-06-16 (×8): qty 1

## 2023-06-16 MED ORDER — ACETAMINOPHEN 325 MG PO TABS: 650.0000 mg | ORAL_TABLET | Freq: Four times a day (QID) | ORAL | Status: DC | PRN

## 2023-06-16 MED ORDER — OSELTAMIVIR PHOSPHATE 30 MG PO CAPS
30.0000 mg | ORAL_CAPSULE | Freq: Once | ORAL | Status: AC
  Administered 2023-06-16: 30 mg via ORAL
  Filled 2023-06-16: qty 1

## 2023-06-16 MED ORDER — HYOSCYAMINE SULFATE 0.125 MG SL SUBL: 0.1250 mg | SUBLINGUAL_TABLET | SUBLINGUAL | Status: DC | PRN

## 2023-06-16 MED ORDER — SODIUM CHLORIDE 0.9 % IV SOLN
2.0000 g | Freq: Once | INTRAVENOUS | Status: AC
  Administered 2023-06-16: 2 g via INTRAVENOUS
  Filled 2023-06-16: qty 12.5

## 2023-06-16 MED ORDER — ISOSORBIDE MONONITRATE ER 30 MG PO TB24
60.0000 mg | ORAL_TABLET | Freq: Two times a day (BID) | ORAL | Status: DC
  Administered 2023-06-16 – 2023-06-19 (×6): 60 mg via ORAL
  Filled 2023-06-16 (×3): qty 1
  Filled 2023-06-16 (×3): qty 2

## 2023-06-16 MED ORDER — IPRATROPIUM-ALBUTEROL 0.5-2.5 (3) MG/3ML IN SOLN
3.0000 mL | RESPIRATORY_TRACT | Status: DC
  Administered 2023-06-16: 3 mL via RESPIRATORY_TRACT
  Filled 2023-06-16: qty 3

## 2023-06-16 MED ORDER — DEXTROSE 50 % IV SOLN: 50.0000 mL | INTRAVENOUS | Status: DC | PRN

## 2023-06-16 MED ORDER — VITAMIN C 500 MG PO TABS
500.0000 mg | ORAL_TABLET | Freq: Every day | ORAL | Status: DC
  Administered 2023-06-17 – 2023-06-19 (×3): 500 mg via ORAL
  Filled 2023-06-16 (×3): qty 1

## 2023-06-16 MED ORDER — METHYLPREDNISOLONE SODIUM SUCC 40 MG IJ SOLR
40.0000 mg | Freq: Two times a day (BID) | INTRAMUSCULAR | Status: DC
  Administered 2023-06-17 – 2023-06-19 (×5): 40 mg via INTRAVENOUS
  Filled 2023-06-16 (×5): qty 1

## 2023-06-16 MED ORDER — FLUPHENAZINE HCL 5 MG PO TABS
2.5000 mg | ORAL_TABLET | Freq: Every morning | ORAL | Status: DC
  Administered 2023-06-16 – 2023-06-19 (×4): 2.5 mg via ORAL
  Filled 2023-06-16 (×8): qty 1

## 2023-06-16 NOTE — ED Provider Triage Note (Signed)
Emergency Medicine Provider Triage Evaluation Note  AKSHAY BILLA , a 63 y.o. male  was evaluated in triage.  Pt complains of SOB since dialysis. Unsure if completed. On O2 at home but unsure how much. No fevers. No N/V/D. Intellectual disability, here by himself  Review of Systems  Positive: CP/SOB Negative: fever  Physical Exam  BP (!) 168/100   Pulse 88   Temp 98.1 F (36.7 C) (Oral)   Resp 20   SpO2 91%  Gen:   Awake, no distress   Resp:  Requiring 4L [place] MSK:   Moves extremities without difficulty  Other:    Medical Decision Making  Medically screening exam initiated at 3:07 PM.  Appropriate orders placed.  Adran Eklund Wantz was informed that the remainder of the evaluation will be completed by another provider, this initial triage assessment does not replace that evaluation, and the importance of remaining in the ED until their evaluation is complete.     Jackelyn Hoehn, PA-C 06/16/23 1510

## 2023-06-16 NOTE — ED Triage Notes (Signed)
 See First Nurse Note, continued shortness of breath in triage. Patient states he normally wears O2 at home, unsure how many liters. Patient placed on 4L [place] with improvement.

## 2023-06-16 NOTE — ED Provider Notes (Signed)
 [facility] Provider Note    Event Date/Time   First MD Initiated Contact with Patient 06/16/23 1627     (approximate)   History   Shortness of Breath   HPI  Todd Brown is a 63 year old male with history of COPD, ESRD on HD, schizophrenia presenting to the emergency department for evaluation of shortness of breath.  Per EMS, patient was at dialysis complaining of cough and increasing shortness of breath.  Does wear home oxygen, unsure how much she normally wears.  With EMS, 88% on room air.  Patient with limited ability to provide history.  Unsure how long he has been feeling short of breath.  Does think he finished his dialysis session today.  Does not think he has had fevers.    Physical Exam   Triage Vital Signs: ED Triage Vitals  Encounter Vitals Group     BP 06/16/23 1504 (!) 168/100     Systolic BP Percentile --      Diastolic BP Percentile --      Pulse Rate 06/16/23 1504 88     Resp 06/16/23 1504 20     Temp 06/16/23 1504 98.1 F (36.7 C)     Temp Source 06/16/23 1504 Oral     SpO2 06/16/23 1504 91 %     Weight --      Height --      Head Circumference --      Peak Flow --      Pain Score 06/16/23 1505 6     Pain Loc --      Pain Education --      Exclude from Growth Chart --     Most recent vital signs: Vitals:   06/16/23 2130 06/16/23 2200  BP: (!) 159/84 (!) 171/92  Pulse: 84 91  Resp: 18 (!) 26  Temp:    SpO2: 98% 97%     General: Awake, interactive  CV:  Regular rate, good peripheral perfusion.  Resp:  Somewhat labored respirations, diminished lung sounds with occasional expiratory wheezing Abd:  Nondistended.  Neuro:  No gross facial asymmetry, moving extremity spontaneously and equally, limited orientation to situation, frequently repeats the questions I asked back to me without providing an answer  ED Results / Procedures / Treatments   Labs (all labs ordered are listed, but only abnormal results are  displayed) Labs Reviewed  RESP PANEL BY RT-PCR (RSV, FLU A&B, COVID)  RVPGX2 - Abnormal; Notable for the following components:      Result Value   Influenza A by PCR POSITIVE (*)    All other components within normal limits  COMPREHENSIVE METABOLIC PANEL - Abnormal; Notable for the following components:   Sodium 134 (*)    Chloride 89 (*)    Glucose, Bld 69 (*)    Creatinine, Ser 5.13 (*)    Total Protein 9.1 (*)    GFR, Estimated 12 (*)    Anion gap 18 (*)    All other components within normal limits  BRAIN NATRIURETIC PEPTIDE - Abnormal; Notable for the following components:   B Natriuretic Peptide 209.6 (*)    All other components within normal limits  CBC WITH DIFFERENTIAL/PLATELET - Abnormal; Notable for the following components:   RBC 4.11 (*)    MCV 105.6 (*)    MCH 34.8 (*)    Lymphs Abs 0.4 (*)    All other components within normal limits  TROPONIN I (HIGH SENSITIVITY) - Abnormal; Notable for the following  components:   Troponin I (High Sensitivity) 23 (*)    All other components within normal limits  TROPONIN I (HIGH SENSITIVITY) - Abnormal; Notable for the following components:   Troponin I (High Sensitivity) 25 (*)    All other components within normal limits  CULTURE, BLOOD (ROUTINE X 2)  CULTURE, BLOOD (ROUTINE X 2)  EXPECTORATED SPUTUM ASSESSMENT W GRAM STAIN, RFLX TO RESP C  HIV ANTIBODY (ROUTINE TESTING W REFLEX)  BASIC METABOLIC PANEL  CBC  CBG MONITORING, ED     EKG EKG independently reviewed interpreted by myself (ER attending) demonstrates:  EKG demonstrates normal sinus and the rate of 92, PR 140, QRS 94, QTc 502, no acute ST changes  RADIOLOGY Imaging independently reviewed and interpreted by myself demonstrates:  CXR with right hilar fullness, radiology notes possible retrocardiac opacity/infiltrate  PROCEDURES:  Critical Care performed: No  Procedures   MEDICATIONS ORDERED IN ED: Medications  vancomycin  (VANCOCIN ) IVPB 1000 mg/200 mL  premix (has no administration in time range)  dextrose  50 % solution 50 mL (has no administration in time range)  oseltamivir  (TAMIFLU ) capsule 30 mg (has no administration in time range)  albuterol  (PROVENTIL ) (2.5 MG/3ML) 0.083% nebulizer solution 2.5 mg (has no administration in time range)  ipratropium-albuterol  (DUONEB) 0.5-2.5 (3) MG/3ML nebulizer solution 3 mL (has no administration in time range)  dextromethorphan -guaiFENesin  (MUCINEX  DM) 30-600 MG per 12 hr tablet 1 tablet (has no administration in time range)  methylPREDNISolone  sodium succinate (SOLU-MEDROL ) 40 mg/mL injection 40 mg (has no administration in time range)  diphenhydrAMINE  (BENADRYL ) injection 12.5 mg (has no administration in time range)  hydrALAZINE  (APRESOLINE ) injection 5 mg (has no administration in time range)  acetaminophen  (TYLENOL ) tablet 650 mg (has no administration in time range)  heparin  injection 5,000 Units (has no administration in time range)  oseltamivir  (TAMIFLU ) capsule 30 mg (has no administration in time range)  ipratropium-albuterol  (DUONEB) 0.5-2.5 (3) MG/3ML nebulizer solution 6 mL (6 mLs Nebulization Given 06/16/23 1652)  methylPREDNISolone  sodium succinate (SOLU-MEDROL ) 125 mg/2 mL injection 125 mg (125 mg Intravenous Given 06/16/23 2025)  ceFEPIme  (MAXIPIME ) 2 g in sodium chloride  0.9 % 100 mL IVPB (0 g Intravenous Stopped 06/16/23 2220)  azithromycin  (ZITHROMAX ) 500 mg in sodium chloride  0.9 % 250 mL IVPB (0 mg Intravenous Stopped 06/16/23 2220)     IMPRESSION / MDM / ASSESSMENT AND PLAN / ED COURSE  I reviewed the triage vital signs and the nursing notes.  Differential diagnosis includes, but is not limited to, pneumonia, viral illness, COPD exacerbation, volume overload  Patient's presentation is most consistent with acute presentation with potential threat to life or bodily function.  63 year old male presenting with shortness of breath initial vitals notable for hypertension.  Labs with  mild stable troponin elevation.  Flu test did return positive.  X-Emeline Simpson also concerning for possible pneumonia.  Patient does not meet sepsis criteria, but he was ordered for antibiotics with cefepime , azithromycin , vancomycin  in the setting of possible influenza associated pneumonia as well as residing in assisted living facility.  I do suspect that patient's acute respiratory illness is likely causing a COPD flare.  Ordered for steroids.  On reevaluation, patient continues to report shortness of breath, does seem to have ongoing labored respirations.  With this, do think admission is reasonable.  Will reach out to hospitalist team.  Case reviewed with Dr. Rosalea Collin.  He will evaluate the patient for anticipated admission.     FINAL CLINICAL IMPRESSION(S) / ED DIAGNOSES   Final diagnoses:  Influenza A  COPD exacerbation (HCC)     Rx / DC Orders   ED Discharge Orders     None        Note:  This document was prepared using Dragon voice recognition software and may include unintentional dictation errors.   Claria Crofts, MD 06/16/23 2225

## 2023-06-16 NOTE — ED Triage Notes (Signed)
 First Nurse Note;  Pt via [facility] from Dialysis. Unknown if pt had complete treatment of dialysis. Pt c/o cough and increasingly SOB. Pt has a hx of intelectual disability and blind.  170/100 BP  89 HR  EMS report 88% on RA,

## 2023-06-16 NOTE — H&P (Addendum)
History and Physical    Todd Brown:[id number] DOB: Jan 21, 1961 DOA: 06/16/2023  Referring MD/NP/PA:   PCP: Smiley Houseman, NP   Patient coming from:  The patient is coming from home.     Chief Complaint: SOB  HPI: Todd Brown is a 63 y.o. male with medical history significant of ESRD-HD (MWF), schizophrenia, HTN, HLD, COPD on 2 L oxygen, CAD, hypothyroidism, GERD, remote right leg DVT 2018 not on anticoagulants currently, cardiac arrest, strep bacteremia with negative TEE, blindness, presents with shortness of breath.  Patient has some intellectual impairment, and is a poor historian. Per report, pt is from dialysis center. He is found to have fever, cough, SOB.  Patient denies chest pain, nausea, vomiting, diarrhea or abdominal pain.  He thinks that he finished full course of dialysis.  His temperature is 100.3 in ED.  He had an oxygen desaturated to 88% on room air, currently 96% on home level 2 L oxygen.  No respiratory distress during the review.  Data reviewed independently and ED Course: pt was found to have positive Flu A PCR, WBC 8.6, potassium 3.9, bicarbonate 27, creatinine 5.13, BUN 22, GFR 12, troponin 23 --> 25 (patient has baseline troponin 28 -37 recently).  Temperature 100.3, blood pressure 164/84, heart rate 88, RR 20.  Patient is admitted to telemetry bed as inpatient.  Chest x-ray: Persistent fullness of the right lung hilum. Component of enlarged pulmonary arteries with possible although separate lymph nodes or other lesion is in the differential. Enlarged cardiac silhouette.   Left retrocardiac opacity.  Possible infiltrate.    EKG: I have personally reviewed.  Sinus rhythm, QTc 502, artificial effects   Review of Systems:   General: has fevers, no chills, no body weight gain, has fatigue HEENT: no blurry vision, hearing changes or sore throat Respiratory: has dyspnea, coughing, no wheezing CV: no chest pain, no palpitations GI: no nausea,  vomiting, abdominal pain, diarrhea, constipation GU: no dysuria, burning on urination, increased urinary frequency, hematuria  Ext: no leg edema Neuro: no unilateral weakness, numbness, or tingling, no vision change or hearing loss Skin: no rash, no skin tear. MSK: No muscle spasm, no deformity, no limitation of range of movement in spin Heme: No easy bruising.  Travel history: No recent long distant travel.   Allergy:  Allergies  Allergen Reactions   Chlorpromazine Other (See Comments)    Reaction:  Unknown , pt states it makes him feel real bad Reaction:  Unknown , pt states it makes him feel real bad     Past Medical History:  Diagnosis Date   Anemia    Arthritis    Chronic respiratory failure (HCC)    COPD (chronic obstructive pulmonary disease) (HCC)    Dialysis patient (HCC)    Mon. 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   ESRD (end stage renal disease) (HCC)    ESRD (end stage renal disease) on dialysis (HCC)    Hyperlipidemia    Hypertension    Malignant hypertension    Pneumonia 2016   Renal artery stenosis (HCC)    Schizophrenia (HCC)     Past Surgical History:  Procedure Laterality Date   A/V FISTULAGRAM N/A 08/06/2016   Procedure: A/V Fistulagram;  Surgeon: Annice Needy, MD;  Location: [facility];  Service: Cardiovascular;  Laterality: N/A;   A/V FISTULAGRAM N/A 02/14/2021   Procedure: A/V FISTULAGRAM poss Perm Cath Insertion;  Surgeon:  Schnier, Latina Craver, MD;  Location: [facility];  Service: Cardiovascular;  Laterality: N/A;   A/V FISTULAGRAM Left 08/26/2021   Procedure: A/V Fistulagram;  Surgeon: Annice Needy, MD;  Location: [facility];  Service: Cardiovascular;  Laterality: Left;   A/V SHUNT INTERVENTION N/A 08/06/2016   Procedure: A/V Shunt Intervention;  Surgeon: Annice Needy, MD;  Location: [facility];  Service: Cardiovascular;  Laterality: N/A;   AV FISTULA  PLACEMENT Left 12/26/2014   Procedure: ARTERIOVENOUS (AV) FISTULA CREATION;  Surgeon: Annice Needy, MD;  Location: [facility];  Service: Vascular;  Laterality: Left;   AV FISTULA PLACEMENT     ESOPHAGOGASTRODUODENOSCOPY (EGD) WITH PROPOFOL N/A 05/06/2017   Procedure: ESOPHAGOGASTRODUODENOSCOPY (EGD) WITH PROPOFOL;  Surgeon: Wyline Mood, MD;  Location: [facility];  Service: Gastroenterology;  Laterality: N/A;   INSERTION OF DIALYSIS CATHETER Right    PERIPHERAL VASCULAR CATHETERIZATION N/A 10/22/2014   Procedure: Dialysis/Perma Catheter Insertion;  Surgeon: Annice Needy, MD;  Location: [facility];  Service: Cardiovascular;  Laterality: N/A;   PERIPHERAL VASCULAR CATHETERIZATION N/A 02/28/2015   Procedure: Dialysis/Perma Catheter Removal;  Surgeon: Annice Needy, MD;  Location: [facility];  Service: Cardiovascular;  Laterality: N/A;   Repair fx left lower leg     yrs ago (age 54)   REVISON OF ARTERIOVENOUS FISTULA Left 03/27/2022   Procedure: REVISON OF ARTERIOVENOUS FISTULA (JUMP GRAFT);  Surgeon: Renford Dills, MD;  Location: [facility];  Service: Vascular;  Laterality: Left;   TEE WITHOUT CARDIOVERSION N/A 09/21/2022   Procedure: TRANSESOPHAGEAL ECHOCARDIOGRAM;  Surgeon: Debbe Odea, MD;  Location: [facility];  Service: Cardiovascular;  Laterality: N/A;   TONSILLECTOMY      Social History:  reports that he has never smoked. He has never been exposed to tobacco smoke. He has never used smokeless tobacco. He reports that he does not currently use alcohol. He reports that he does not currently use drugs after having used the following drugs: Marijuana.  Family History:  Family History  Problem Relation Age of Onset   Kidney disease Brother    Diabetes Neg Hx      Prior to Admission medications   Medication Sig Start Date End Date Taking? Authorizing Provider  albuterol (VENTOLIN HFA) 108 (90 Base) MCG/ACT inhaler Inhale 1-2 puffs into the lungs every 4 (four)  hours as needed for wheezing or shortness of breath.    [provider]  ascorbic acid (VITAMIN C) 500 MG tablet Take 500 mg by mouth daily.    [provider]  b complex-vitamin c-folic acid (NEPHRO-VITE) 0.8 MG TABS tablet Take 1 tablet by mouth at bedtime.    [provider]  calcium acetate (PHOSLO) 667 MG capsule Take 1,334 mg by mouth 3 (three) times daily with meals. 0800/1400/2000    [provider]  carbamazepine (TEGRETOL) 200 MG tablet Take 1,000 mg by mouth every morning.    [provider]  cholecalciferol (VITAMIN D) 1000 UNITS tablet Take 1,000 Units by mouth in the morning.    [provider]  ferrous sulfate 325 (65 FE) MG tablet Take 1 tablet (325 mg total) by mouth daily. 05/19/15   Enedina Finner, MD  fluPHENAZine (PROLIXIN) 2.5 MG tablet Take 2.5 mg by mouth in the morning.    [provider]  Fluticasone-Salmeterol (ADVAIR) 250-50 MCG/DOSE AEPB Inhale 1 puff into the lungs 2 (two) times daily.    [provider]  guaiFENesin-dextromethorphan (ROBITUSSIN DM) 100-10  MG/5ML syrup Take 5 mLs by mouth every 4 (four) hours as needed for cough. 05/04/23   Sunnie Nielsen, DO  hydrALAZINE (APRESOLINE) 100 MG tablet Take 1 tablet (100 mg total) by mouth every 8 (eight) hours. 05/04/23   Sunnie Nielsen, DO  hyoscyamine (LEVSIN SL) 0.125 MG SL tablet Place 0.125 mg under the tongue every 4 (four) hours as needed for cramping.    [provider]  ipratropium-albuterol (DUONEB) 0.5-2.5 (3) MG/3ML SOLN Take 3 mLs by nebulization every 6 (six) hours as needed. 05/04/23   Sunnie Nielsen, DO  isosorbide mononitrate (IMDUR) 60 MG 24 hr tablet Take 60 mg by mouth 2 (two) times daily.    [provider]  levothyroxine (SYNTHROID) 125 MCG tablet Take 1 tablet (125 mcg total) by mouth daily. 11/08/22 04/30/23  Alford Highland, MD  naproxen (NAPROSYN) 500 MG tablet Take 500 mg by mouth 2 (two) times daily as  needed for mild pain. 10/10/22   [provider]  nicotine (NICODERM CQ - DOSED IN MG/24 HOURS) 21 mg/24hr patch Place 1 patch (21 mg total) onto the skin daily. 05/05/23   Sunnie Nielsen, DO  omeprazole (PRILOSEC) 40 MG capsule Take 40 mg by mouth every morning.    [provider]  polyethylene glycol (MIRALAX / GLYCOLAX) 17 g packet Take 17 g by mouth daily as needed for mild constipation.    [provider]  rosuvastatin (CRESTOR) 10 MG tablet Take 10 mg by mouth at bedtime. 04/19/23   [provider]  traMADol (ULTRAM) 50 MG tablet Take 50 mg by mouth every 6 (six) hours as needed. 03/09/23   [provider]  trihexyphenidyl (ARTANE) 2 MG tablet Take 2 mg by mouth 2 (two) times daily.    [provider]  zinc sulfate 220 (50 Zn) MG capsule Take 220 mg by mouth daily.    [provider]    Physical Exam: Vitals:   06/17/23 0000 06/17/23 0030 06/17/23 0100 06/17/23 0154  BP: 128/77 131/80 (!) 140/95   Pulse: 88 85 95   Resp: 20 20 20    Temp:    99.4 F (37.4 C)  TempSrc:    Oral  SpO2: 95% 95% 98%    General: Not in acute distress HEENT:       Eyes: no jaundice       ENT: No discharge from the ears and nose, no pharynx injection, no tonsillar enlargement.        Neck: No JVD, no bruit, no mass felt. Heme: No neck lymph node enlargement. Cardiac: S1/S2, RRR, No murmurs, No gallops or rubs. Respiratory: Has rhonchi bilaterally GI: Soft, nondistended, nontender, no rebound pain, no organomegaly, BS present. GU: No hematuria Ext: No pitting leg edema bilaterally. 1+DP/PT pulse bilaterally. Musculoskeletal: No joint deformities, No joint redness or warmth, no limitation of ROM in spin. Skin: No rashes.  Neuro: Lethargic, but easily arousable, oriented X3, has eye blindness, moves all extremities normally. Psych: Patient is not psychotic, no suicidal or hemocidal ideation.  Labs on Admission: I have personally reviewed  following labs and imaging studies  CBC: Recent Labs  Lab 06/16/23 1518  WBC 8.6  NEUTROABS 7.0  HGB 14.3  HCT 43.4  MCV 105.6*  PLT 231   Basic Metabolic Panel: Recent Labs  Lab 06/16/23 1518  NA 134*  K 3.9  CL 89*  CO2 27  GLUCOSE 69*  BUN 22  CREATININE 5.13*  CALCIUM 9.7   GFR: CrCl cannot be calculated (  Unknown ideal weight.). Liver Function Tests: Recent Labs  Lab 06/16/23 1518  AST 38  ALT 22  ALKPHOS 99  BILITOT 0.9  PROT 9.1*  ALBUMIN 4.2   No results for input(s): "LIPASE", "AMYLASE" in the last 168 hours. No results for input(s): "AMMONIA" in the last 168 hours. Coagulation Profile: No results for input(s): "INR", "PROTIME" in the last 168 hours. Cardiac Enzymes: No results for input(s): "CKTOTAL", "CKMB", "CKMBINDEX", "TROPONINI" in the last 168 hours. BNP (last 3 results) No results for input(s): "PROBNP" in the last 8760 hours. HbA1C: No results for input(s): "HGBA1C" in the last 72 hours. CBG: Recent Labs  Lab 06/16/23 1743 06/17/23 0103  GLUCAP 70 106*   Lipid Profile: No results for input(s): "CHOL", "HDL", "LDLCALC", "TRIG", "CHOLHDL", "LDLDIRECT" in the last 72 hours. Thyroid Function Tests: No results for input(s): "TSH", "T4TOTAL", "FREET4", "T3FREE", "THYROIDAB" in the last 72 hours. Anemia Panel: No results for input(s): "VITAMINB12", "FOLATE", "FERRITIN", "TIBC", "IRON", "RETICCTPCT" in the last 72 hours. Urine analysis:    Component Value Date/Time   COLORURINE YELLOW (A) 05/17/2015 0012   APPEARANCEUR HAZY (A) 05/17/2015 0012   LABSPEC 1.009 05/17/2015 0012   PHURINE 7.0 05/17/2015 0012   GLUCOSEU NEGATIVE 05/17/2015 0012   HGBUR 1+ (A) 05/17/2015 0012   BILIRUBINUR NEGATIVE 05/17/2015 0012   KETONESUR NEGATIVE 05/17/2015 0012   PROTEINUR 30 (A) 05/17/2015 0012   NITRITE NEGATIVE 05/17/2015 0012   LEUKOCYTESUR 3+ (A) 05/17/2015 0012   Sepsis Labs: @LABRCNTIP (procalcitonin:4,lacticidven:4) ) Recent Results (from  the past 240 hours)  Resp panel by RT-PCR (RSV, Flu A&B, Covid) Anterior Nasal Swab     Status: Abnormal   Collection Time: 06/16/23  3:10 PM   Specimen: Anterior Nasal Swab  Result Value Ref Range Status   SARS Coronavirus 2 by RT PCR NEGATIVE NEGATIVE Final    Comment: (NOTE) SARS-CoV-2 target nucleic acids are NOT DETECTED.  The SARS-CoV-2 RNA is generally detectable in upper respiratory specimens during the acute phase of infection. The lowest concentration of SARS-CoV-2 viral copies this assay can detect is 138 copies/mL. A negative result does not preclude SARS-Cov-2 infection and should not be used as the sole basis for treatment or other patient management decisions. A negative result may occur with  improper specimen collection/handling, submission of specimen other than nasopharyngeal swab, presence of viral mutation(s) within the areas targeted by this assay, and inadequate number of viral copies(<138 copies/mL). A negative result must be combined with clinical observations, patient history, and epidemiological information. The expected result is Negative.  Fact Sheet for Patients:  BloggerCourse.com  Fact Sheet for Healthcare Providers:  SeriousBroker.it  This test is no t yet approved or cleared by the [place] FDA and  has been authorized for detection and/or diagnosis of SARS-CoV-2 by FDA under an Emergency Use Authorization (EUA). This EUA will remain  in effect (meaning this test can be used) for the duration of the COVID-19 declaration under Section 564(b)(1) of the Act, 21 U.S.C.section 360bbb-3(b)(1), unless the authorization is terminated  or revoked sooner.       Influenza A by PCR POSITIVE (A) NEGATIVE Final   Influenza B by PCR NEGATIVE NEGATIVE Final    Comment: (NOTE) The Xpert Xpress SARS-CoV-2/FLU/RSV plus assay is intended as an aid in the diagnosis of influenza from Nasopharyngeal swab  specimens and should not be used as a sole basis for treatment. Nasal washings and aspirates are unacceptable for Xpert Xpress SARS-CoV-2/FLU/RSV testing.  Fact Sheet for Patients: BloggerCourse.com  Fact Sheet for Healthcare Providers: SeriousBroker.it  This test is not yet approved or cleared by the [place] FDA and has been authorized for detection and/or diagnosis of SARS-CoV-2 by FDA under an Emergency Use Authorization (EUA). This EUA will remain in effect (meaning this test can be used) for the duration of the COVID-19 declaration under Section 564(b)(1) of the Act, 21 U.S.C. section 360bbb-3(b)(1), unless the authorization is terminated or revoked.     Resp Syncytial Virus by PCR NEGATIVE NEGATIVE Final    Comment: (NOTE) Fact Sheet for Patients: BloggerCourse.com  Fact Sheet for Healthcare Providers: SeriousBroker.it  This test is not yet approved or cleared by the [place] FDA and has been authorized for detection and/or diagnosis of SARS-CoV-2 by FDA under an Emergency Use Authorization (EUA). This EUA will remain in effect (meaning this test can be used) for the duration of the COVID-19 declaration under Section 564(b)(1) of the Act, 21 U.S.C. section 360bbb-3(b)(1), unless the authorization is terminated or revoked.  Performed at [facility], [address], [place], [place] [address]      Radiological Exams on Admission:   Assessment/Plan Principal Problem:   Influenza A Active Problems:   COPD (chronic obstructive pulmonary disease) (HCC)   ESRD on hemodialysis (HCC)   Essential hypertension   CAD (coronary artery disease)   Hypothyroidism   Schizophrenia (HCC)   Blindness of both eyes   Assessment and Plan:   Influenza A:  CXR showed some left retrocardiac opacity, possible infiltrate.  Patient does not have leukocytosis,  patient received 1 dose of vancomycin, cefepime and azithromycin in ED.  Will not continue antibiotics.  Patient has 2 L new oxygen requirement, but no acute respiratory distress.  - will admit to tele bed as inpatient -Tamiflu 30 mg twice daily -Bronchodilators -Solu-Medrol 125 mg, then 40 mg daily -Mucinex for cough  -Incentive spirometry -sputum culture -Nasal cannula oxygen as needed to maintain O2 saturation 93% or greater  COPD (chronic obstructive pulmonary disease) (HCC) -Bronchodilators as above  ESRD on hemodialysis (MWF) -Consulted Dr. Cherylann Ratel of renal for dialysis  Essential hypertension -IV hydralazine as needed -Oral hydralazine, Imdur  CAD (coronary artery disease) -Crestor  Hypothyroidism -Synthroid  Schizophrenia (HCC) -Artane, prolixin, Tegretol  Blindness of both eyes -Fall precaution  Abnormal findings by chest x-ray: Chest x-ray showed persistent fullness of the right lung hilum. Component of enlarged pulmonary arteries with possible although separate lymph nodes or other lesion is in the differential. -Need to follow-up with PCP to repeat image after Flu A is treated.    DVT ppx: SQ Heparin     Code Status: Full code    Family Communication: not done, no family member is at bed side.       Disposition Plan:  Anticipate discharge back to previous environment. ALF  Consults called:  Dr. Cherylann Ratel of renal  Admission status and Level of care: Telemetry Medical:  as inpt        Dispo: The patient is from: ALF              Anticipated d/c is to: ALF              Anticipated d/c date is: 2 days              Patient currently is not medically stable to d/c.    Severity of Illness:  The appropriate patient status for this patient is INPATIENT. Inpatient status is judged to be reasonable and necessary in  order to provide the required intensity of service to ensure the patient's safety. The patient's presenting symptoms, physical exam findings, and  initial radiographic and laboratory data in the context of their chronic comorbidities is felt to place them at high risk for further clinical deterioration. Furthermore, it is not anticipated that the patient will be medically stable for discharge from the hospital within 2 midnights of admission.   * I certify that at the point of admission it is my clinical judgment that the patient will require inpatient hospital care spanning beyond 2 midnights from the point of admission due to high intensity of service, high risk for further deterioration and high frequency of surveillance required.*       Date of Service 06/17/2023    Lorretta Harp [facility]   If 7PM-7AM, please contact night-coverage [email or web address] 06/17/2023, 1:57 AM  For on call review [email or web address].

## 2023-06-16 NOTE — Progress Notes (Signed)
 PHARMACY NOTE:  ANTIMICROBIAL RENAL DOSAGE ADJUSTMENT  Current antimicrobial regimen includes a mismatch between antimicrobial dosage and estimated renal function.  As per policy approved by the Pharmacy & Therapeutics and Medical Executive Committees, the antimicrobial dosage will be adjusted accordingly.  Current antimicrobial dosage: Tamiflu  30 mg PO BID x 5 days  Indication: Influenza treatment  Renal Function:  CrCl cannot be calculated (Unknown ideal weight.). [x]      On intermittent HD, scheduled: MWF    Antimicrobial dosage has been changed to: Tamiflu  30 mg PO x 1 today and then repeat Tamiflu  30 mg post-HD over ~ 5 day period (w/ next two HD sessions)  Thank you for allowing pharmacy to be a part of this patient's care.  Page Boast, [facility] 06/16/2023 8:58 PM

## 2023-06-16 NOTE — ED Notes (Signed)
This RN attempted IV access x 2.   ?

## 2023-06-17 DIAGNOSIS — Z515 Encounter for palliative care: Secondary | ICD-10-CM | POA: Diagnosis not present

## 2023-06-17 DIAGNOSIS — N186 End stage renal disease: Secondary | ICD-10-CM | POA: Diagnosis not present

## 2023-06-17 DIAGNOSIS — J101 Influenza due to other identified influenza virus with other respiratory manifestations: Secondary | ICD-10-CM | POA: Diagnosis not present

## 2023-06-17 DIAGNOSIS — I10 Essential (primary) hypertension: Secondary | ICD-10-CM | POA: Diagnosis not present

## 2023-06-17 DIAGNOSIS — J449 Chronic obstructive pulmonary disease, unspecified: Secondary | ICD-10-CM | POA: Diagnosis not present

## 2023-06-17 DIAGNOSIS — J441 Chronic obstructive pulmonary disease with (acute) exacerbation: Secondary | ICD-10-CM | POA: Diagnosis not present

## 2023-06-17 LAB — BASIC METABOLIC PANEL
Anion gap: 17 — ABNORMAL HIGH (ref 5–15)
BUN: 22 mg/dL (ref 8–23)
CO2: 25 mmol/L (ref 22–32)
Calcium: 8.7 mg/dL — ABNORMAL LOW (ref 8.9–10.3)
Chloride: 91 mmol/L — ABNORMAL LOW (ref 98–111)
Creatinine, Ser: 5.65 mg/dL — ABNORMAL HIGH (ref 0.61–1.24)
GFR, Estimated: 11 mL/min — ABNORMAL LOW (ref >60)
Glucose, Bld: 118 mg/dL — ABNORMAL HIGH (ref 70–99)
Potassium: 4.6 mmol/L (ref 3.5–5.1)
Sodium: 133 mmol/L — ABNORMAL LOW (ref 135–145)

## 2023-06-17 LAB — HIV ANTIBODY (ROUTINE TESTING W REFLEX): HIV Screen 4th Generation wRfx: NONREACTIVE

## 2023-06-17 LAB — CBC
HCT: 38.3 % — ABNORMAL LOW (ref 39.0–52.0)
Hemoglobin: 12.3 g/dL — ABNORMAL LOW (ref 13.0–17.0)
MCH: 34.9 pg — ABNORMAL HIGH (ref 26.0–34.0)
MCHC: 32.1 g/dL (ref 30.0–36.0)
MCV: 108.8 fL — ABNORMAL HIGH (ref 80.0–100.0)
Platelets: 179 10*3/uL (ref 150–400)
RBC: 3.52 MIL/uL — ABNORMAL LOW (ref 4.22–5.81)
RDW: 13.9 % (ref 11.5–15.5)
WBC: 6.2 10*3/uL (ref 4.0–10.5)
nRBC: 0 % (ref 0.0–0.2)

## 2023-06-17 LAB — CBG MONITORING, ED: Glucose-Capillary: 106 mg/dL — ABNORMAL HIGH (ref 70–99)

## 2023-06-17 MED ORDER — CHLORHEXIDINE GLUCONATE CLOTH 2 % EX PADS
6.0000 | MEDICATED_PAD | Freq: Every day | CUTANEOUS | Status: DC
  Administered 2023-06-19: 6 via TOPICAL
  Filled 2023-06-17: qty 6

## 2023-06-17 MED ORDER — IPRATROPIUM-ALBUTEROL 0.5-2.5 (3) MG/3ML IN SOLN
3.0000 mL | RESPIRATORY_TRACT | Status: DC
  Administered 2023-06-17 – 2023-06-18 (×7): 3 mL via RESPIRATORY_TRACT
  Filled 2023-06-17 (×7): qty 3

## 2023-06-17 NOTE — Evaluation (Signed)
Physical Therapy Evaluation Patient Details Name: Todd Brown MRN: [id number] DOB: 16-May-1961 Today's Date: 06/17/2023  History of Present Illness  Pt admitted for Flu A with complaints of fever, cough, and SOB. History includes ESRD on MWF, schizophrenia, HTN, HLD, COPD on chronic 2L O2, GERD, and blindness.  Clinical Impression  Pt is a pleasant 63 year old male who was admitted for Flu A. Pt performs bed mobility with min/mod assist. Per patient report he is only doing transfers at baseline now with staff from ALF. He reports he can transfer in/out of WC and then he can self propel- but staff are available to assist. Recliner and/or WC are not available in ED at this time to simulate transfer. Pt demonstrates good seated balance at bedside. Anticipate pt is close to baseline status, however needs to demonstrate safety to perform transfer. Pt demonstrates deficits with strength. Would benefit from skilled PT to address above deficits and promote optimal return to PLOF. Pt will continue to receive skilled PT services while admitted and will defer to TOC/care team for updates regarding disposition planning.       If plan is discharge home, recommend the following: A little help with walking and/or transfers;Help with stairs or ramp for entrance   Can travel by private vehicle        Equipment Recommendations None recommended by PT  Recommendations for Other Services       Functional Status Assessment Patient has had a recent decline in their functional status and demonstrates the ability to make significant improvements in function in a reasonable and predictable amount of time.     Precautions / Restrictions Precautions Precautions: Fall Restrictions Weight Bearing Restrictions Per Provider Order: No      Mobility  Bed Mobility Overal bed mobility: Needs Assistance Bed Mobility: Supine to Sit, Sit to Supine     Supine to sit: Min assist Sit to supine: Mod assist    General bed mobility comments: mainly needs assist for B LE. Once seated at EOB, upright posture noted. Pt declines to attempt further mobility at this time    Transfers                   General transfer comment: unable to perform    Ambulation/Gait                  Stairs            Wheelchair Mobility     Tilt Bed    Modified Rankin (Stroke Patients Only)       Balance Overall balance assessment: Needs assistance Sitting-balance support: Feet unsupported, Bilateral upper extremity supported Sitting balance-[name]: Good Sitting balance - Comments: with B UE support                                     Pertinent Vitals/Pain Pain Assessment Pain Assessment: No/denies pain    Home Living Family/patient expects to be discharged to:: Assisted living                 Home Equipment: Wheelchair - manual Additional Comments: from [place] [facility]    Prior Function Prior Level of Function : Patient poor historian/Family not available             Mobility Comments: pt reports he doesn't walk anymore and just transfers in/out of WC where he self propels around ALF ADLs  Comments: reports staff assist with getting dressed and other ADLs     Extremity/Trunk Assessment   Upper Extremity Assessment Upper Extremity Assessment: Overall WFL for tasks assessed    Lower Extremity Assessment Lower Extremity Assessment: Generalized weakness (B UE grossly 4/5)       Communication   Communication Communication: Difficulty communicating thoughts/reduced clarity of speech  Cognition Arousal: Alert Behavior During Therapy: WFL for tasks assessed/performed Overall Cognitive Status: Within Functional Limits for tasks assessed                                 General Comments: speech garbled but appears WNL        General Comments      Exercises     Assessment/Plan    PT Assessment Patient needs  continued PT services  PT Problem List Decreased strength;Decreased balance;Decreased mobility       PT Treatment Interventions DME instruction;Gait training;Therapeutic exercise;Balance training    PT Goals (Current goals can be found in the Care Plan section)  Acute Rehab PT Goals Patient Stated Goal: to go back to ALF PT Goal Formulation: With patient Time For Goal Achievement: 07/01/23 Potential to Achieve Goals: Fair    Frequency Min 1X/week     Co-evaluation               AM-PAC PT "6 Clicks" Mobility  Outcome Measure Help needed turning from your back to your side while in a flat bed without using bedrails?: A Little Help needed moving from lying on your back to sitting on the side of a flat bed without using bedrails?: A Little Help needed moving to and from a bed to a chair (including a wheelchair)?: A Lot Help needed standing up from a chair using your arms (e.g., wheelchair or bedside chair)?: A Lot Help needed to walk in hospital room?: Total Help needed climbing 3-5 steps with a railing? : Total 6 Click Score: 12    End of Session Equipment Utilized During Treatment: Oxygen Activity Tolerance: Patient tolerated treatment well Patient left: in bed Nurse Communication: Mobility status PT Visit Diagnosis: Muscle weakness (generalized) (M62.81);Difficulty in walking, not elsewhere classified (R26.2)    Time: 0981-1914 PT Time Calculation (min) (ACUTE ONLY): 12 min   Charges:   PT Evaluation $PT Eval Low Complexity: 1 Low   PT General Charges $$ ACUTE PT VISIT: 1 Visit         Elizabeth Palau, PT, DPT, GCS [phone]   Aricka Goldberger 06/17/2023, 4:21 PM

## 2023-06-17 NOTE — TOC CM/SW Note (Signed)
CSW attempted to contact pt's DSS GOP, Ms. Elsie Lincoln and left a voicemail asking for a returned call.

## 2023-06-17 NOTE — Discharge Planning (Signed)
ESTABLISHED HEMODIALYSIS: Outpatient Facility [facility]  [address]  [place], [place] [address] [phone]  Scheduled Days: Monday Wednesday and Friday   Treatment Time: 11:30am  Dimas Chyle Dialysis Coordinator II  [facility] Cell: [phone] eFax: [phone] [email or web address]

## 2023-06-17 NOTE — ED Notes (Signed)
Pharmacy messaged to send prolixin and dulera.

## 2023-06-17 NOTE — Progress Notes (Signed)
[facility]  ROUNDING NOTE   Subjective:   Todd Brown is a 63 year old male with past medical conditions including COPD, hypertension, DVT, schizophrenia, and end-stage renal disease on hemodialysis.  Patient presents to the emergency department with cough and shortness of breath.  Patient has been admitted for Influenza A [J10.1]  Patient is known to our practice and receives outpatient dialysis treatments at [facility] on a MWF schedule, supervised by Dr. Cherylann Ratel.  Patient received 1 hour of dialysis yesterday before presenting to emergency department.  Patient is a poor historian.  Appears when EMS arrived to dialysis clinic, patient had oxygen saturations of 88% on room air.  Denies recent sick contacts.  No known fever or chills.  No missed recent treatments.  Patient assisted with set up of breakfast tray.  Labs appear unremarkable for renal patient.  Troponin 23, BNP 209.  Respiratory panel was positive for influenza A.  Chest x-ray shows enlarged cardiac silhouette with a left retrocardiac opacity, possible infiltrate.  We have been consulted to continue dialysis during this admission.   Objective:  Vital signs in last 24 hours:  Temp:  [98.1 F (36.7 C)-100.3 F (37.9 C)] 98.9 F (37.2 C) (01/16 1023) Pulse Rate:  [78-100] 86 (01/16 1300) Resp:  [10-26] 20 (01/16 1300) BP: (108-190)/(70-100) 119/79 (01/16 1300) SpO2:  [91 %-100 %] 92 % (01/16 1300)  Weight change:  There were no vitals filed for this visit.  Intake/Output: I/O last 3 completed shifts: In: 230 [P.O.:230] Out: -    Intake/Output this shift:  No intake/output data recorded.  Physical Exam: General: NAD  Head: Normocephalic, atraumatic. Moist oral mucosal membranes  Eyes: Anicteric  Lungs:  Clear to auscultation, NCO 2, dry cough  Heart: Regular rate and rhythm  Abdomen:  Soft, nontender, nondistended  Extremities: No peripheral edema.  Neurologic: Nonfocal, moving all four  extremities  Skin: No lesions  Access: Left aVF    Basic Metabolic Panel: Recent Labs  Lab 06/16/23 1518 06/17/23 0405  NA 134* 133*  K 3.9 4.6  CL 89* 91*  CO2 27 25  GLUCOSE 69* 118*  BUN 22 22  CREATININE 5.13* 5.65*  CALCIUM 9.7 8.7*    Liver Function Tests: Recent Labs  Lab 06/16/23 1518  AST 38  ALT 22  ALKPHOS 99  BILITOT 0.9  PROT 9.1*  ALBUMIN 4.2   No results for input(s): "LIPASE", "AMYLASE" in the last 168 hours. No results for input(s): "AMMONIA" in the last 168 hours.  CBC: Recent Labs  Lab 06/16/23 1518 06/17/23 0405  WBC 8.6 6.2  NEUTROABS 7.0  --   HGB 14.3 12.3*  HCT 43.4 38.3*  MCV 105.6* 108.8*  PLT 231 179    Cardiac Enzymes: No results for input(s): "CKTOTAL", "CKMB", "CKMBINDEX", "TROPONINI" in the last 168 hours.  BNP: Invalid input(s): "POCBNP"  CBG: Recent Labs  Lab 06/16/23 1743 06/17/23 0103  GLUCAP 70 106*    Microbiology: Results for orders placed or performed during the hospital encounter of 06/16/23  Resp panel by RT-PCR (RSV, Flu A&B, Covid) Anterior Nasal Swab     Status: Abnormal   Collection Time: 06/16/23  3:10 PM   Specimen: Anterior Nasal Swab  Result Value Ref Range Status   SARS Coronavirus 2 by RT PCR NEGATIVE NEGATIVE Final    Comment: (NOTE) SARS-CoV-2 target nucleic acids are NOT DETECTED.  The SARS-CoV-2 RNA is generally detectable in upper respiratory specimens during the acute phase of infection. The lowest concentration  of SARS-CoV-2 viral copies this assay can detect is 138 copies/mL. A negative result does not preclude SARS-Cov-2 infection and should not be used as the sole basis for treatment or other patient management decisions. A negative result may occur with  improper specimen collection/handling, submission of specimen other than nasopharyngeal swab, presence of viral mutation(s) within the areas targeted by this assay, and inadequate number of viral copies(<138 copies/mL). A  negative result must be combined with clinical observations, patient history, and epidemiological information. The expected result is Negative.  Fact Sheet for Patients:  BloggerCourse.com  Fact Sheet for Healthcare Providers:  SeriousBroker.it  This test is no t yet approved or cleared by the [place] FDA and  has been authorized for detection and/or diagnosis of SARS-CoV-2 by FDA under an Emergency Use Authorization (EUA). This EUA will remain  in effect (meaning this test can be used) for the duration of the COVID-19 declaration under Section 564(b)(1) of the Act, 21 U.S.C.section 360bbb-3(b)(1), unless the authorization is terminated  or revoked sooner.       Influenza A by PCR POSITIVE (A) NEGATIVE Final   Influenza B by PCR NEGATIVE NEGATIVE Final    Comment: (NOTE) The Xpert Xpress SARS-CoV-2/FLU/RSV plus assay is intended as an aid in the diagnosis of influenza from Nasopharyngeal swab specimens and should not be used as a sole basis for treatment. Nasal washings and aspirates are unacceptable for Xpert Xpress SARS-CoV-2/FLU/RSV testing.  Fact Sheet for Patients: BloggerCourse.com  Fact Sheet for Healthcare Providers: SeriousBroker.it  This test is not yet approved or cleared by the [place] FDA and has been authorized for detection and/or diagnosis of SARS-CoV-2 by FDA under an Emergency Use Authorization (EUA). This EUA will remain in effect (meaning this test can be used) for the duration of the COVID-19 declaration under Section 564(b)(1) of the Act, 21 U.S.C. section 360bbb-3(b)(1), unless the authorization is terminated or revoked.     Resp Syncytial Virus by PCR NEGATIVE NEGATIVE Final    Comment: (NOTE) Fact Sheet for Patients: BloggerCourse.com  Fact Sheet for Healthcare  Providers: SeriousBroker.it  This test is not yet approved or cleared by the [place] FDA and has been authorized for detection and/or diagnosis of SARS-CoV-2 by FDA under an Emergency Use Authorization (EUA). This EUA will remain in effect (meaning this test can be used) for the duration of the COVID-19 declaration under Section 564(b)(1) of the Act, 21 U.S.C. section 360bbb-3(b)(1), unless the authorization is terminated or revoked.  Performed at [facility], [address] [place], [place] [address]   Culture, blood (Routine X 2) w Reflex to ID Panel     Status: None (Preliminary result)   Collection Time: 06/16/23  9:11 PM   Specimen: BLOOD  Result Value Ref Range Status   Specimen Description BLOOD RIGHT HAND  Final   Special Requests   Final    BOTTLES DRAWN AEROBIC AND ANAEROBIC Blood Culture results may not be optimal due to an inadequate volume of blood received in culture bottles   Culture   Final    NO GROWTH < 12 HOURS Performed at [facility], [address] [place], [place] [address]    Report Status PENDING  Incomplete  Culture, blood (Routine X 2) w Reflex to ID Panel     Status: None (Preliminary result)   Collection Time: 06/16/23  9:11 PM   Specimen: BLOOD  Result Value Ref Range Status   Specimen Description BLOOD RIGHT ASSIST CONTROL  Final  Special Requests   Final    BOTTLES DRAWN AEROBIC AND ANAEROBIC Blood Culture results may not be optimal due to an inadequate volume of blood received in culture bottles   Culture   Final    NO GROWTH < 12 HOURS Performed at [facility], [address] [place], [place] [address]    Report Status PENDING  Incomplete    Coagulation Studies: No results for input(s): "LABPROT", "INR" in the last 72 hours.  Urinalysis: No results for input(s): "COLORURINE", "LABSPEC", "PHURINE", "GLUCOSEU", "HGBUR", "BILIRUBINUR", "KETONESUR", "PROTEINUR",  "UROBILINOGEN", "NITRITE", "LEUKOCYTESUR" in the last 72 hours.  Invalid input(s): "APPERANCEUR"    Imaging: DG Chest 2 View Result Date: 06/16/2023 CLINICAL DATA:  Shortness of breath EXAM: CHEST - 2 VIEW COMPARISON:  04/30/2023 x-ray FINDINGS: Enlarged cardiopericardial silhouette. No pneumothorax or effusion. There is again prominence of the interstitium and central vasculature with fullness in the right lung hilum and persistent nodular opacity at the left lung base. IMPRESSION: Persistent fullness of the right lung hilum. Component of enlarged pulmonary arteries with possible although separate lymph nodes or other lesion is in the differential. Enlarged cardiac silhouette. Left retrocardiac opacity.  Possible infiltrate. Recommend follow up Electronically Signed   By: Karen Kays M.D.   On: 06/16/2023 17:13     Medications:     ascorbic acid  500 mg Oral Daily   calcium acetate  1,334 mg Oral TID WC   carbamazepine  1,000 mg Oral q morning   cholecalciferol  1,000 Units Oral q AM   ferrous sulfate  325 mg Oral Q breakfast   fluPHENAZine  2.5 mg Oral q AM   heparin  5,000 Units Subcutaneous Q8H   hydrALAZINE  100 mg Oral Q8H   ipratropium-albuterol  3 mL Nebulization Q4H   isosorbide mononitrate  60 mg Oral BID   levothyroxine  125 mcg Oral Q0600   methylPREDNISolone (SOLU-MEDROL) injection  40 mg Intravenous Q12H   mometasone-formoterol  2 puff Inhalation BID   multivitamin  1 tablet Oral QHS   nicotine  21 mg Transdermal Daily   [START ON 06/18/2023] oseltamivir  30 mg Oral Q M,W,F-2000   pantoprazole  40 mg Oral Daily   rosuvastatin  10 mg Oral QHS   trihexyphenidyl  2 mg Oral BID   zinc sulfate (50mg  elemental zinc)  220 mg Oral Daily   acetaminophen, albuterol, dextromethorphan-guaiFENesin, dextrose, diphenhydrAMINE, hydrALAZINE, hyoscyamine, polyethylene glycol, traMADol  Assessment/ Plan:  Todd Brown is a 63 y.o.  male  with past medical conditions including  COPD, hypertension, DVT, schizophrenia, and end-stage renal disease on hemodialysis.  Patient presents to the emergency department with cough and shortness of breath.  Patient has been admitted for Influenza A [J10.1]  [facility]/MWF/left aVF   End-stage renal disease on hemodialysis.  Patient received 1 hour of treatment yesterday prior to ED presentation.  Labs and patient appears stable at this time.  No acute indication for dialysis.  Patient receives regular scheduled treatment tomorrow.  2. Anemia of chronic kidney disease Lab Results  Component Value Date   HGB 12.3 (L) 06/17/2023    Hemoglobin within desired goal.  Patient does receive Mircera at outpatient clinic.  3. Secondary Hyperparathyroidism: with outpatient labs: PTH 356, phosphorus 4.1, calcium 8.9 on 06/14/23.   Lab Results  Component Value Date   PTH 194 (H) 05/11/2017   CALCIUM 8.7 (L) 06/17/2023   CAION 1.19 03/27/2022   PHOS 5.6 (H) 05/03/2023  Will continue to monitor bone minerals during this admission.  Patient prescribed calcium acetate and cholecalciferol outpatient.  4.  Hypertension with chronic kidney disease.  Home regimen includes hydralazine and isosorbide.  Both currently prescribed    LOS: 1 Tyechia Allmendinger 1/16/20251:22 PM

## 2023-06-17 NOTE — Progress Notes (Signed)
1      PROGRESS NOTE    Todd Brown  ZOX:[id number] DOB: 1960-08-31 DOA: 06/16/2023 PCP: Smiley Houseman, NP    Brief Narrative:   63 y.o. male with medical history significant of ESRD-HD (MWF), schizophrenia, HTN, HLD, COPD on 2 L oxygen, CAD, hypothyroidism, GERD, remote right leg DVT 2018 not on anticoagulants currently, cardiac arrest, strep bacteremia with negative TEE, blindness, presents with shortness of breath  1/16: Palliative care consult   Assessment & Plan:   Principal Problem:   Influenza A Active Problems:   COPD (chronic obstructive pulmonary disease) (HCC)   ESRD on hemodialysis (HCC)   Essential hypertension   CAD (coronary artery disease)   Hypothyroidism   Schizophrenia (HCC)   Blindness of both eyes   Influenza A:  CXR showed some left retrocardiac opacity, possible infiltrate.  Patient does not have leukocytosis, patient received 1 dose of vancomycin, cefepime and azithromycin in ED.  Will not continue antibiotics.  Patient has 2 L new oxygen requirement, but no acute respiratory distress.   -Tamiflu 30 mg twice daily -Bronchodilators -Solu-Medrol 125 mg, then 40 mg daily -Mucinex for cough  -Incentive spirometry -sputum culture -Nasal cannula oxygen as needed to maintain O2 saturation 93% or greater   COPD (chronic obstructive pulmonary disease) (HCC) -Bronchodilators as above   ESRD on hemodialysis (MWF) -Nephro consulted for dialysis   Essential hypertension -IV hydralazine as needed -Oral hydralazine, Imdur   CAD (coronary artery disease) -Crestor   Hypothyroidism -Synthroid   Schizophrenia (HCC) -Artane, prolixin, Tegretol   Blindness of both eyes -Fall precaution   Abnormal findings by chest x-ray: Chest x-ray showed persistent fullness of the right lung hilum. Component of enlarged pulmonary arteries with possible although separate lymph nodes or other lesion is in the differential. -Need to follow-up with PCP to repeat  image after Flu A is treated.   Goals of care Palliative care consult   DVT prophylaxis:  heparin injection 5,000 Units Start: 06/16/23 2200     Code Status: Full code Family Communication: NO "discussed with patient" Disposition Plan: Possible discharge in next 1 to 2 days depending on clinical condition   Consultants:  Nephrology Palliative care     Subjective:  Poor historian.  Feels tired, does have some dry cough  Objective: Vitals:   06/17/23 1500 06/17/23 1530 06/17/23 1537 06/17/23 1538  BP: 121/75 124/80  124/80  Pulse: 81 81    Resp: (!) 22 (!) 22    Temp:   98.4 F (36.9 C)   TempSrc:   Axillary   SpO2: 96% 96%      Intake/Output Summary (Last 24 hours) at 06/17/2023 1549 Last data filed at 06/16/2023 2329 Gross per 24 hour  Intake 230 ml  Output --  Net 230 ml   There were no vitals filed for this visit.  Examination:  General exam: Appears calm and comfortable  Respiratory system: Clear to auscultation. Respiratory effort normal. Cardiovascular system: S1 & S2 heard, RRR. No JVD, murmurs, rubs, gallops or clicks. No pedal edema. Gastrointestinal system: Abdomen is soft, benign Central nervous system: Alert and awake, nonfocal Extremities: Symmetric 5 x 5 power. Skin: No rashes, lesions or ulcers Access: Left AV fistula    Data Reviewed: I have personally reviewed following labs and imaging studies  CBC: Recent Labs  Lab 06/16/23 1518 06/17/23 0405  WBC 8.6 6.2  NEUTROABS 7.0  --   HGB 14.3 12.3*  HCT 43.4 38.3*  MCV 105.6* 108.8*  PLT 231 179   Basic Metabolic Panel: Recent Labs  Lab 06/16/23 1518 06/17/23 0405  NA 134* 133*  K 3.9 4.6  CL 89* 91*  CO2 27 25  GLUCOSE 69* 118*  BUN 22 22  CREATININE 5.13* 5.65*  CALCIUM 9.7 8.7*   GFR: CrCl cannot be calculated (Unknown ideal weight.). Liver Function Tests: Recent Labs  Lab 06/16/23 1518  AST 38  ALT 22  ALKPHOS 99  BILITOT 0.9  PROT 9.1*  ALBUMIN 4.2     CBG: Recent Labs  Lab 06/16/23 1743 06/17/23 0103  GLUCAP 70 106*     Recent Results (from the past 240 hours)  Resp panel by RT-PCR (RSV, Flu A&B, Covid) Anterior Nasal Swab     Status: Abnormal   Collection Time: 06/16/23  3:10 PM   Specimen: Anterior Nasal Swab  Result Value Ref Range Status   SARS Coronavirus 2 by RT PCR NEGATIVE NEGATIVE Final    Comment: (NOTE) SARS-CoV-2 target nucleic acids are NOT DETECTED.  The SARS-CoV-2 RNA is generally detectable in upper respiratory specimens during the acute phase of infection. The lowest concentration of SARS-CoV-2 viral copies this assay can detect is 138 copies/mL. A negative result does not preclude SARS-Cov-2 infection and should not be used as the sole basis for treatment or other patient management decisions. A negative result may occur with  improper specimen collection/handling, submission of specimen other than nasopharyngeal swab, presence of viral mutation(s) within the areas targeted by this assay, and inadequate number of viral copies(<138 copies/mL). A negative result must be combined with clinical observations, patient history, and epidemiological information. The expected result is Negative.  Fact Sheet for Patients:  BloggerCourse.com  Fact Sheet for Healthcare Providers:  SeriousBroker.it  This test is no t yet approved or cleared by the [place] FDA and  has been authorized for detection and/or diagnosis of SARS-CoV-2 by FDA under an Emergency Use Authorization (EUA). This EUA will remain  in effect (meaning this test can be used) for the duration of the COVID-19 declaration under Section 564(b)(1) of the Act, 21 U.S.C.section 360bbb-3(b)(1), unless the authorization is terminated  or revoked sooner.       Influenza A by PCR POSITIVE (A) NEGATIVE Final   Influenza B by PCR NEGATIVE NEGATIVE Final    Comment: (NOTE) The Xpert Xpress  SARS-CoV-2/FLU/RSV plus assay is intended as an aid in the diagnosis of influenza from Nasopharyngeal swab specimens and should not be used as a sole basis for treatment. Nasal washings and aspirates are unacceptable for Xpert Xpress SARS-CoV-2/FLU/RSV testing.  Fact Sheet for Patients: BloggerCourse.com  Fact Sheet for Healthcare Providers: SeriousBroker.it  This test is not yet approved or cleared by the [place] FDA and has been authorized for detection and/or diagnosis of SARS-CoV-2 by FDA under an Emergency Use Authorization (EUA). This EUA will remain in effect (meaning this test can be used) for the duration of the COVID-19 declaration under Section 564(b)(1) of the Act, 21 U.S.C. section 360bbb-3(b)(1), unless the authorization is terminated or revoked.     Resp Syncytial Virus by PCR NEGATIVE NEGATIVE Final    Comment: (NOTE) Fact Sheet for Patients: BloggerCourse.com  Fact Sheet for Healthcare Providers: SeriousBroker.it  This test is not yet approved or cleared by the [place] FDA and has been authorized for detection and/or diagnosis of SARS-CoV-2 by FDA under an Emergency Use Authorization (EUA). This EUA will remain in effect (meaning this test can be used) for the duration of the  COVID-19 declaration under Section 564(b)(1) of the Act, 21 U.S.C. section 360bbb-3(b)(1), unless the authorization is terminated or revoked.  Performed at [facility], [address] [place], [place] [address]   Culture, blood (Routine X 2) w Reflex to ID Panel     Status: None (Preliminary result)   Collection Time: 06/16/23  9:11 PM   Specimen: BLOOD  Result Value Ref Range Status   Specimen Description BLOOD RIGHT HAND  Final   Special Requests   Final    BOTTLES DRAWN AEROBIC AND ANAEROBIC Blood Culture results may not be optimal due to an inadequate volume  of blood received in culture bottles   Culture   Final    NO GROWTH < 12 HOURS Performed at [facility], [address], [place], [place] [address]    Report Status PENDING  Incomplete  Culture, blood (Routine X 2) w Reflex to ID Panel     Status: None (Preliminary result)   Collection Time: 06/16/23  9:11 PM   Specimen: BLOOD  Result Value Ref Range Status   Specimen Description BLOOD RIGHT ASSIST CONTROL  Final   Special Requests   Final    BOTTLES DRAWN AEROBIC AND ANAEROBIC Blood Culture results may not be optimal due to an inadequate volume of blood received in culture bottles   Culture   Final    NO GROWTH < 12 HOURS Performed at [facility], [address] [place], [place] [address]    Report Status PENDING  Incomplete         Radiology Studies: DG Chest 2 View Result Date: 06/16/2023 CLINICAL DATA:  Shortness of breath EXAM: CHEST - 2 VIEW COMPARISON:  04/30/2023 x-ray FINDINGS: Enlarged cardiopericardial silhouette. No pneumothorax or effusion. There is again prominence of the interstitium and central vasculature with fullness in the right lung hilum and persistent nodular opacity at the left lung base. IMPRESSION: Persistent fullness of the right lung hilum. Component of enlarged pulmonary arteries with possible although separate lymph nodes or other lesion is in the differential. Enlarged cardiac silhouette. Left retrocardiac opacity.  Possible infiltrate. Recommend follow up Electronically Signed   By: Karen Kays M.D.   On: 06/16/2023 17:13        Scheduled Meds:  ascorbic acid  500 mg Oral Daily   calcium acetate  1,334 mg Oral TID WC   carbamazepine  1,000 mg Oral q morning   [START ON 06/18/2023] Chlorhexidine Gluconate Cloth  6 each Topical Q0600   cholecalciferol  1,000 Units Oral q AM   ferrous sulfate  325 mg Oral Q breakfast   fluPHENAZine  2.5 mg Oral q AM   heparin  5,000 Units Subcutaneous Q8H   hydrALAZINE  100 mg Oral Q8H    ipratropium-albuterol  3 mL Nebulization Q4H   isosorbide mononitrate  60 mg Oral BID   levothyroxine  125 mcg Oral Q0600   methylPREDNISolone (SOLU-MEDROL) injection  40 mg Intravenous Q12H   mometasone-formoterol  2 puff Inhalation BID   multivitamin  1 tablet Oral QHS   nicotine  21 mg Transdermal Daily   [START ON 06/18/2023] oseltamivir  30 mg Oral Q M,W,F-2000   pantoprazole  40 mg Oral Daily   rosuvastatin  10 mg Oral QHS   trihexyphenidyl  2 mg Oral BID   zinc sulfate (50mg  elemental zinc)  220 mg Oral Daily   Continuous Infusions:   LOS: 1 day    Time spent: 35 minutes    Blakelynn Scheeler  Sherryll Burger, MD [facility] Pager [phone]  If 7PM-7AM, please contact night-coverage [email or web address]  06/17/2023, 3:49 PM

## 2023-06-17 NOTE — TOC Initial Note (Signed)
Transition of Care [facility]) - Initial/Assessment Note    Patient Details  Name: Todd Brown MRN: [id number] Date of Birth: 01-May-1961  Transition of Care [facility]) CM/SW Contact:    Tory Emerald, LCSW Phone Number: 06/17/2023, 2:48 PM  Clinical Narrative:                  CSW completed TOC assessment w/ pt's DSS guardian, Daniel Nones, as well as high risk assessment.   Admitted for: pneumonia Admitted from: [facility] PCP: pt has an established PCP through [facility] Pharm[facility]: pt uses [facility] pharmacy  Current home health/prior home health/DME: None    Expected Discharge Plan: Group Home Barriers to Discharge: Continued Medical Work up   Patient Goals and CMS Choice Patient states their goals for this hospitalization and ongoing recovery are:: To return to the [facility]          Expected Discharge Plan and Services       Living arrangements for the past 2 months: Group Home                                      Prior Living Arrangements/Services Living arrangements for the past 2 months: Group Home Lives with:: Facility Resident Patient language and need for interpreter reviewed:: Yes Do you feel safe going back to the place where you live?: Yes      Need for Family Participation in Patient Care: Yes (Comment) Care giver support system in place?: Yes (comment)   Criminal Activity/Legal Involvement Pertinent to Current Situation/Hospitalization: No - Comment as needed  Activities of Daily Living      Permission Sought/Granted Permission sought to share information with : Guardian Permission granted to share information with : Yes, Verbal Permission Granted  Share Information with NAME: DSS  Permission granted to share info w AGENCY: [facility]        Emotional Assessment         Alcohol / Substance Use: Not Applicable Psych Involvement: No (comment)  Admission diagnosis:  Influenza A [J10.1] Patient Active Problem List   Diagnosis Date Noted   Influenza A 06/16/2023    Acute hypoxemic respiratory failure (HCC) 04/30/2023   Blindness of both eyes 04/30/2023   Elevated troponin 04/30/2023   Low back pain 04/30/2023   Productive cough 04/30/2023   Myocardial injury 11/03/2022   CAD (coronary artery disease) 11/03/2022   Hypothyroidism 11/03/2022   Thrombocytopenia (HCC) 11/03/2022   Aspiration pneumonia (HCC) 11/03/2022   Abdominal distension 11/03/2022   HTN (hypertension) 11/03/2022   Pericardial effusion 11/03/2022   CAP (community acquired pneumonia) 09/14/2022   Anasarca (mild pulmonary edema, pleural and pericardial effusion, ascites on CT) 09/14/2022   Acute on chronic respiratory failure (HCC) 09/14/2022   Bacteremia 06/20/2022   Acute encephalopathy 06/19/2022   SIRS (systemic inflammatory response syndrome) (HCC) 06/19/2022   NSTEMI (non-ST elevated myocardial infarction) (HCC) 06/19/2022   Acute on chronic respiratory failure with hypoxia (HCC) 05/21/2022   Hypoxia 05/20/2022   COVID-19 virus infection 05/20/2022   Hyponatremia 02/10/2022   Anemia due to chronic kidney disease 01/21/2022   Hyperkalemia 01/14/2022   Malnutrition of moderate degree 12/24/2021   Oral thrush 12/22/2021   Physical deconditioning 12/20/2021   Acute metabolic encephalopathy 12/15/2021   Essential hypertension 12/15/2021   ESRD on hemodialysis (HCC) 12/15/2021   Altered mental status 10/08/2021   Hypertensive urgency 10/07/2021   COPD (chronic obstructive pulmonary disease) (HCC) 02/26/2021  GERD (gastroesophageal reflux disease) 02/26/2021   Hypertension 11/28/2020   Acute respiratory failure with hypoxia (HCC) 10/23/2019   Anemia of chronic disease 10/23/2019   Fluid overload 10/08/2018   Acute pulmonary edema (HCC) 09/12/2018   Scrotal edema 05/10/2017   Symptomatic anemia 04/30/2017   Anticoagulated on Coumadin 09/14/2016   Cardiac arrest (HCC) 08/03/2016   Severe sepsis versus SIRS 05/26/2016   Dialysis patient (HCC) 04/15/2016   End stage renal  disease (HCC) 02/12/2016   Hyperkalemia 05/20/2015   Hepatitis C 09/26/2013   Schizophrenia (HCC) 03/23/2013   Essential hypertension, benign 03/23/2013   Chronic kidney disease 03/23/2013   PCP:  Smiley Houseman, NP Pharmacy:   [facility] - [place], [place] - [address] [place] [address] Phone: [phone] Fax: [phone]     Social Drivers of Health (SDOH) Social History: SDOH Screenings   Food Insecurity: Patient Unable To Answer (05/01/2023)  Housing: Patient Unable To Answer (05/01/2023)  Transportation Needs: Patient Unable To Answer (05/01/2023)  Utilities: Patient Unable To Answer (05/01/2023)  Alcohol Screen: Low Risk  (10/05/2019)  Financial Resource Strain: Low Risk  (10/08/2018)  Physical Activity: Unknown (10/08/2018)  Social Connections: Unknown (10/08/2018)  Stress: No Stress Concern Present (10/08/2018)  Tobacco Use: Low Risk  (06/16/2023)   SDOH Interventions:     Readmission Risk Interventions    06/17/2023    2:42 PM 11/05/2022    4:39 PM 09/17/2022   12:00 PM  Readmission Risk Prevention Plan  Transportation Screening Complete Complete Complete  Medication Review Oceanographer) Complete Complete Complete  PCP or Specialist appointment within 3-5 days of discharge Complete    HRI or Home Care Consult  Complete Complete  SW Recovery Care/Counseling Consult Complete Complete   Palliative Care Screening Not Applicable    Skilled Nursing Facility Not Applicable Not Applicable Not Applicable

## 2023-06-17 NOTE — Consult Note (Signed)
Consultation Note Date: 06/17/2023 at 1600  Patient Name: Todd Brown  DOB: May 02, 1961  MRN: [id number]  Age / Sex: 63 y.o., male  PCP: Todd Houseman, NP Referring Physician: Delfino Lovett, MD  HPI/Patient Profile: 63 y.o. male  with past medical history of schizophrenia, HTN, HLD, ESRD (HD-MWF), COPD (2L [place] at baseline), DVT (RLE-2018-not on [facility]), cardiac arrest, strep bacteremia (negative TEE), blindness, hypothyroidism, GERD, and CAD admitted on 06/16/2023 with shortness of breath from dialysis center.  In ED, chest x-ray revealed left retrocardiac opacity, possible infiltrate.  Patient given IV antibiotics in the ED but were discontinued once respiratory panel revealed patient is positive for influenza A.  Patient is being treated with Tamiflu, bronchodilators, Solu-Medrol, Mucinex, and supplemental oxygen.  PMT was consulted to discuss goals of care.   Clinical Assessment and Goals of Care: Extensive chart review completed prior to meeting patient including labs, vital signs, imaging, progress notes, orders, and available advanced directive documents from current and previous encounters. I then met with at bedside to discuss diagnosis prognosis, GOC, EOL wishes, disposition and options.  I introduced Palliative Medicine as specialized medical care for people living with serious illness. It focuses on providing relief from the symptoms and stress of a serious illness. The goal is to improve quality of life for both the patient and the family.  We discussed a brief life review of the patient.  Patient endorses he has no family, no wife/children/living relatives.  He endorses that the people at his nursing home are his support.  He shares he did not work and has been on disability the majority of his life.   Discussed that patient has legal guardian.  Patient shares he did not know that he had a legal  guardian and thought that the people at his nursing home where his decision makers.  When asked his understanding of his current medical situation, the patient shares that he is sick.  When asked to elaborate further, patient is unable to clarify the reasons for his hospitalization.    We discussed patient's current illness -influenza A-and what it means in the larger context of patient's on-going co-morbidities -ESRD, COPD, overall functional/ability challenges.  Counseled with patient on treatment for influenza A (virus versus bacteria), supportive measures for symptom management, and continued monitoring of his vital signs and blood work.  I attempted to elicit values and goals of care important to the patient.  He says he would like some ice cream.  I reiterated that I am discussing goals as far as wishes for this hospitalization.  He again shares he would just like some ice cream.  Patient unable to participate in goals of care medical decision making independently.  After visiting with the patient, I spoke with his legal guardian Todd Brown over the phone.  Brief medical update given.  Discussed that patient has influenza and appropriate treatments being given.  I clarified that Todd Brown is patient's legal guardian and medical decision maker.  She confirms that Todd Brown memory can often wax  and wane.  She shares concerns that he has the flu and is hopeful for recovery.  Full code and full scope remain.  Todd Brown has PMT contact info was advised to call with any acute palliative needs.  No adjustment to plan of care at this time.  Symptoms are appropriately managed at this time with current regimen.  No adjustment to [facility] needed.  PMT will continue to follow and support patient throughout his hospitalization.  Primary Decision Maker LEGAL GUARDIAN  Physical Exam Vitals reviewed.  HENT:     Head: Normocephalic.  Eyes:     Pupils: Pupils are equal, round, and reactive to light.   Cardiovascular:     Rate and Rhythm: Normal rate.  Pulmonary:     Breath sounds: Examination of the right-lower field reveals decreased breath sounds. Examination of the left-lower field reveals decreased breath sounds. Decreased breath sounds present.  Musculoskeletal:     Comments: Generalized weakness  Neurological:     Mental Status: He is alert.     Comments: Oriented to self and place  Psychiatric:        Mood and Affect: Mood normal. Mood is not anxious.        Behavior: Behavior normal. Behavior is not agitated.     Palliative Assessment/Data: 30%     Thank you for this consult. Palliative medicine will continue to follow and assist holistically.   Time Total: 75 minutes  Time spent includes: Detailed review of medical records (labs, imaging, vital signs), medically appropriate exam (mental status, respiratory, cardiac, skin), discussed with treatment team, counseling and educating patient, family and staff, documenting clinical information, medication management and coordination of care.  Signed by: Georgiann Cocker, DNP, FNP-BC Palliative Medicine   Please contact Palliative Medicine Team providers via [facility] for questions and concerns.

## 2023-06-18 DIAGNOSIS — Z515 Encounter for palliative care: Secondary | ICD-10-CM | POA: Diagnosis not present

## 2023-06-18 DIAGNOSIS — I10 Essential (primary) hypertension: Secondary | ICD-10-CM | POA: Diagnosis not present

## 2023-06-18 DIAGNOSIS — J441 Chronic obstructive pulmonary disease with (acute) exacerbation: Secondary | ICD-10-CM

## 2023-06-18 DIAGNOSIS — N186 End stage renal disease: Secondary | ICD-10-CM | POA: Diagnosis not present

## 2023-06-18 DIAGNOSIS — J101 Influenza due to other identified influenza virus with other respiratory manifestations: Secondary | ICD-10-CM | POA: Diagnosis not present

## 2023-06-18 DIAGNOSIS — J449 Chronic obstructive pulmonary disease, unspecified: Secondary | ICD-10-CM | POA: Diagnosis not present

## 2023-06-18 LAB — BASIC METABOLIC PANEL
Anion gap: 16 — ABNORMAL HIGH (ref 5–15)
BUN: 38 mg/dL — ABNORMAL HIGH (ref 8–23)
CO2: 27 mmol/L (ref 22–32)
Calcium: 8.9 mg/dL (ref 8.9–10.3)
Chloride: 90 mmol/L — ABNORMAL LOW (ref 98–111)
Creatinine, Ser: 7.66 mg/dL — ABNORMAL HIGH (ref 0.61–1.24)
GFR, Estimated: 7 mL/min — ABNORMAL LOW (ref >60)
Glucose, Bld: 85 mg/dL (ref 70–99)
Potassium: 4.3 mmol/L (ref 3.5–5.1)
Sodium: 133 mmol/L — ABNORMAL LOW (ref 135–145)

## 2023-06-18 LAB — CBC
HCT: 32.9 % — ABNORMAL LOW (ref 39.0–52.0)
Hemoglobin: 11 g/dL — ABNORMAL LOW (ref 13.0–17.0)
MCH: 34.3 pg — ABNORMAL HIGH (ref 26.0–34.0)
MCHC: 33.4 g/dL (ref 30.0–36.0)
MCV: 102.5 fL — ABNORMAL HIGH (ref 80.0–100.0)
Platelets: 187 10*3/uL (ref 150–400)
RBC: 3.21 MIL/uL — ABNORMAL LOW (ref 4.22–5.81)
RDW: 13.6 % (ref 11.5–15.5)
WBC: 4.7 10*3/uL (ref 4.0–10.5)
nRBC: 0 % (ref 0.0–0.2)

## 2023-06-18 MED ORDER — IPRATROPIUM-ALBUTEROL 0.5-2.5 (3) MG/3ML IN SOLN
3.0000 mL | Freq: Three times a day (TID) | RESPIRATORY_TRACT | Status: DC
Start: 2023-06-18 — End: 2023-06-19
  Administered 2023-06-18 – 2023-06-19 (×2): 3 mL via RESPIRATORY_TRACT
  Filled 2023-06-18 (×3): qty 3

## 2023-06-18 MED ORDER — IPRATROPIUM-ALBUTEROL 0.5-2.5 (3) MG/3ML IN SOLN
3.0000 mL | RESPIRATORY_TRACT | Status: DC
  Administered 2023-06-18: 3 mL via RESPIRATORY_TRACT
  Filled 2023-06-18: qty 3

## 2023-06-18 MED ORDER — HEPARIN SODIUM (PORCINE) 1000 UNIT/ML DIALYSIS: 1000.0000 [IU] | INTRAMUSCULAR | Status: DC | PRN

## 2023-06-18 MED ORDER — LIDOCAINE-PRILOCAINE 2.5-2.5 % EX CREA: 1.0000 | TOPICAL_CREAM | CUTANEOUS | Status: DC | PRN

## 2023-06-18 MED ORDER — PENTAFLUOROPROP-TETRAFLUOROETH EX AERO: 1.0000 | INHALATION_SPRAY | CUTANEOUS | Status: DC | PRN

## 2023-06-18 NOTE — Progress Notes (Signed)
                                                                                                                                                                                                           Daily Progress Note   Patient Name: Todd Brown       Date: 06/18/2023 DOB: Oct 15, 1960  Age: 63 y.o. MRN#: [id number] Attending Physician: Delfino Lovett, MD Primary Care Physician: Smiley Houseman, NP Admit Date: 06/16/2023  Reason for Consultation/Follow-up: Establishing goals of care  HPI/Brief Hospital Review:  63 y.o. male  with past medical history of schizophrenia, HTN, HLD, ESRD (HD-MWF), COPD (2L [place] at baseline), DVT (RLE-2018-not on [facility]), cardiac arrest, strep bacteremia (negative TEE), blindness, hypothyroidism, GERD, and CAD admitted on 06/16/2023 with shortness of breath from dialysis center.   In ED, chest x-ray revealed left retrocardiac opacity, possible infiltrate.  Patient given IV antibiotics in the ED but were discontinued once respiratory panel revealed patient is positive for influenza A.   Patient is being treated with Tamiflu, bronchodilators, Solu-Medrol, Mucinex, and supplemental oxygen.   PMT was consulted to discuss goals of care.   Subjective: Extensive chart review has been completed prior to meeting patient including labs, vital signs, imaging, progress notes, orders, and available advanced directive documents from current and previous encounters.    Visited with [name] at his bedside. He is awake, alert, unable to answer orientation questions but able to answer yes or no questions. He denies acute pain, discomfort or difficulty breathing. He shares he was able to rest well, ate breakfast and is now requesting ice cream-notified nursing staff. He is hopeful to return to his facility soon. No family at bedside during time of visit.  Call attempted to [name]-DSS legal guardian, no answer, HIPAA compliant VM left.  From chart review, plan for [name]  to discharge tomorrow back to group home.  PMT will continue to follow for ongoing needs and support.  Thank you for allowing the Palliative Medicine Team to assist in the care of this patient.  Total time:  25 minutes  Time spent includes: Detailed review of medical records (labs, imaging, vital signs), medically appropriate exam (mental status, respiratory, cardiac, skin), discussed with treatment team, counseling and educating patient, family and staff, documenting clinical information, medication management and coordination of care.  Leeanne Deed, DNP, AGNP-C Palliative Medicine   Please contact Palliative Medicine Team phone at [phone] for questions and concerns.

## 2023-06-18 NOTE — Evaluation (Signed)
Occupational Therapy Evaluation Patient Details Name: Todd Brown MRN: [id number] DOB: 1961/02/10 Today's Date: 06/18/2023   History of Present Illness Pt admitted for Flu A with complaints of fever, cough, and SOB. History includes ESRD on MWF, schizophrenia, HTN, HLD, COPD on chronic 2L O2, GERD, and blindness.   Clinical Impression   Pt was seen for OT evaluation this date. Prior to hospital admission, pt was a resident at ALF where he was performing transfers only to his W/C with assist as needed. Was able to self propel and had assist for ADLs.   Pt presents to acute OT demonstrating impaired ADL performance and functional mobility 2/2 weakness and low activity tolerance (See OT problem list for additional functional deficits). Pt currently requires Min A for supine to sit at EOB d/t blindness with verb and tactile cueing to grab bed rail to sit up. He sat with SUP and performed face washin with set up assist. Pt refused to transfer to recliner or BSC even with explanation from therapist that it was just to see that he can do it, not for him to stay in. Pt still refusing-may be willing to trial to W/C. He states he will be able to do it with someone there. He was able to lateral scoot to [name] with SUP holding bedrail and returned to supine with SUP. His sp02 remained 88-90% throughout session. Pt would benefit from skilled OT services to address noted impairments and functional limitations (see below for any additional details) in order to maximize safety and independence while minimizing falls risk and caregiver burden. Do not anticipate the need for follow up OT services upon acute hospital DC if pt is able to perform W/C transfer and proper self in room.        If plan is discharge home, recommend the following: A little help with walking and/or transfers;A lot of help with bathing/dressing/bathroom    Functional Status Assessment  Patient has had a recent decline in their functional  status and demonstrates the ability to make significant improvements in function in a reasonable and predictable amount of time.  Equipment Recommendations  None recommended by OT    Recommendations for Other Services       Precautions / Restrictions Precautions Precautions: Fall Restrictions Weight Bearing Restrictions Per Provider Order: No      Mobility Bed Mobility Overal bed mobility: Needs Assistance Bed Mobility: Supine to Sit, Sit to Supine     Supine to sit: Min assist Sit to supine: Supervision   General bed mobility comments: Min A to get to seated EOB d/t blindness needed hand over hand assist to bring to bed rail to sit up, able to sit with SUP and wash face, but refused recliner or toilet transfer, returned to supine with SUP    Transfers                   General transfer comment: pt refused; may be willing to transfer to a W/C since this is what he does at his facility      Balance Overall balance assessment: Needs assistance Sitting-balance support: Feet unsupported, Single extremity supported Sitting balance-[name]: Good Sitting balance - Comments: no LOB seated EOB with single UE support                                   ADL either performed or assessed with clinical judgement   ADL  Overall ADL's : Needs assistance/impaired;At baseline     Grooming: Wash/dry face;Set up;Cueing for compensatory techniques Grooming Details (indicate cue type and reason): set up/cueing d/t visual deficits otherwise able to perform on his own                                     Vision         Perception         Praxis         Pertinent Vitals/Pain Pain Assessment Pain Assessment: No/denies pain     Extremity/Trunk Assessment Upper Extremity Assessment Upper Extremity Assessment: Overall WFL for tasks assessed   Lower Extremity Assessment Lower Extremity Assessment: Generalized weakness        Communication Communication Communication: Difficulty communicating thoughts/reduced clarity of speech   Cognition Arousal: Alert Behavior During Therapy: WFL for tasks assessed/performed Overall Cognitive Status: Within Functional Limits for tasks assessed                                 General Comments: speech garbled but appears WNL; refusing to transfer, fixated on holding his money envelope and gettin to dialysis today     General Comments  sp02 88-90% during session    Exercises Other Exercises Other Exercises: Edu on role of OT in acute setting and importance of therapy to maximize safety/strength/IND.   Shoulder Instructions      Home Living Family/patient expects to be discharged to:: Assisted living                             Home Equipment: Wheelchair - manual   Additional Comments: from [place] [facility]      Prior Functioning/Environment Prior Level of Function : Patient poor historian/Family not available             Mobility Comments: pt reports he doesn't walk anymore and just transfers in/out of WC where he self propels around ALF ADLs Comments: reports staff assist with getting dressed and other ADLs        OT Problem List: Decreased strength;Decreased activity tolerance      OT Treatment/Interventions: Self-care/ADL training;Therapeutic exercise;Therapeutic activities;DME and/or AE instruction;Patient/family education;Balance training    OT Goals(Current goals can be found in the care plan section) Acute Rehab OT Goals Patient Stated Goal: improve breathing OT Goal Formulation: With patient Time For Goal Achievement: 07/02/23 Potential to Achieve Goals: Good ADL Goals Pt Will Perform Grooming: with set-up;sitting Pt Will Transfer to Toilet: with supervision;with contact guard assist;bedside commode;stand pivot transfer;squat pivot transfer  OT Frequency: Min 1X/week    Co-evaluation              AM-PAC OT  "6 Clicks" Daily Activity     Outcome Measure Help from another person eating meals?: None Help from another person taking care of personal grooming?: A Little Help from another person toileting, which includes using toliet, bedpan, or urinal?: A Lot Help from another person bathing (including washing, rinsing, drying)?: A Lot Help from another person to put on and taking off regular upper body clothing?: A Lot Help from another person to put on and taking off regular lower body clothing?: A Lot 6 Click Score: 15   End of Session Equipment Utilized During Treatment: Oxygen  Activity Tolerance: Patient tolerated treatment well  Patient left: in bed;with call bell/phone within reach;with bed alarm set  OT Visit Diagnosis: Other abnormalities of gait and mobility (R26.89);Unsteadiness on feet (R26.81)                Time: 9811-9147 OT Time Calculation (min): 21 min Charges:  OT General Charges $OT Visit: 1 Visit OT Evaluation $OT Eval Low Complexity: 1 Low Todd Brown, OTR/L 06/18/23, 11:17 AM  Todd Brown Todd Brown 06/18/2023, 11:14 AM

## 2023-06-18 NOTE — Progress Notes (Signed)
1      PROGRESS NOTE    Todd Brown  ZOX:[id number] DOB: August 22, 1960 DOA: 06/16/2023 PCP: Smiley Houseman, NP    Brief Narrative:   63 y.o. male with medical history significant of ESRD-HD (MWF), schizophrenia, HTN, HLD, COPD on 2 L oxygen, CAD, hypothyroidism, GERD, remote right leg DVT 2018 not on anticoagulants currently, cardiac arrest, strep bacteremia with negative TEE, blindness, presents with shortness of breath  1/16: Palliative care consult 1/17: PT, OT eval    Assessment & Plan:   Principal Problem:   Influenza A Active Problems:   COPD (chronic obstructive pulmonary disease) (HCC)   ESRD on hemodialysis (HCC)   Essential hypertension   CAD (coronary artery disease)   Hypothyroidism   Schizophrenia (HCC)   Blindness of both eyes   Influenza A:  CXR showed some left retrocardiac opacity, possible infiltrate.  Patient does not have leukocytosis, patient received 1 dose of vancomycin, cefepime and azithromycin in ED.  No further need of antibiotics.  Patient has 2 L new oxygen requirement, but no acute respiratory distress.   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 Component of enlarged pulmonary arteries with possible although separate lymph nodes or other lesion is in the differential. -Need to follow-up with  PCP to repeat image after Flu A is treated.   Goals of care Palliative care seen   DVT prophylaxis:  heparin injection 5,000 Units Start: 06/16/23 2200     Code Status: Full code Family Communication: NO "discussed with patient" Disposition Plan: Possible discharge in next 1 to 2 days depending on clinical condition   Consultants:  Nephrology Palliative care     Subjective:  Feeling better, cough +  Objective: Vitals:   06/18/23 1600 06/18/23 1630 06/18/23 1700 06/18/23 1703  BP: 112/82 121/80 119/75 117/76  Pulse: 75 82 77 80  Resp: 16 14 16 18   Temp:    98.4 F (36.9 C)  TempSrc:    Oral  SpO2: 98% 97% 99% 97%  Weight:    66.9 kg    Intake/Output Summary (Last 24 hours) at 06/18/2023 1916 Last data filed at 06/18/2023 1703 Gross per 24 hour  Intake --  Output 2000 ml  Net -2000 ml   Filed Weights   06/18/23 1320 06/18/23 1703  Weight: 68.6 kg 66.9 kg    Examination:  General exam: Appears calm and comfortable  Respiratory system: Clear to auscultation. Respiratory effort normal. Cardiovascular system: S1 & S2 heard, RRR. No JVD, murmurs, rubs, gallops.. No pedal edema. Gastrointestinal system: Abdomen is soft, benign Central nervous system: Alert and awake, nonfocal Extremities: Symmetric 5 x 5 power. Skin: No rashes, lesions or ulcers Access: Left AV fistula    Data Reviewed: I have personally reviewed following labs and imaging studies  CBC: Recent Labs  Lab 06/16/23 1518 06/17/23 0405 06/18/23 0458  WBC 8.6 6.2 4.7  NEUTROABS 7.0  --   --  HGB 14.3 12.3* 11.0*  HCT 43.4 38.3* 32.9*  MCV 105.6* 108.8* 102.5*  PLT 231 179 187   Basic Metabolic Panel: Recent Labs  Lab 06/16/23 1518 06/17/23 0405 06/18/23 0458  NA 134* 133* 133*  K 3.9 4.6 4.3  CL 89* 91* 90*  CO2 27 25 27   GLUCOSE 69* 118* 85  BUN 22 22 38*  CREATININE 5.13* 5.65* 7.66*  CALCIUM 9.7 8.7* 8.9   GFR: Estimated Creatinine Clearance: 9.5 mL/min (A) (by C-G  formula based on SCr of 7.66 mg/dL (H)). Liver Function Tests: Recent Labs  Lab 06/16/23 1518  AST 38  ALT 22  ALKPHOS 99  BILITOT 0.9  PROT 9.1*  ALBUMIN 4.2    CBG: Recent Labs  Lab 06/16/23 1743 06/17/23 0103  GLUCAP 70 106*     Recent Results (from the past 240 hours)  Resp panel by RT-PCR (RSV, Flu A&B, Covid) Anterior Nasal Swab     Status: Abnormal   Collection Time: 06/16/23  3:10 PM   Specimen: Anterior Nasal Swab  Result Value Ref Range Status   SARS Coronavirus 2 by RT PCR NEGATIVE NEGATIVE Final    Comment: (NOTE) SARS-CoV-2 target nucleic acids are NOT DETECTED.  The SARS-CoV-2 RNA is generally detectable in upper respiratory specimens during the acute phase of infection. The lowest concentration of SARS-CoV-2 viral copies this assay can detect is 138 copies/mL. A negative result does not preclude SARS-Cov-2 infection and should not be used as the sole basis for treatment or other patient management decisions. A negative result may occur with  improper specimen collection/handling, submission of specimen other than nasopharyngeal swab, presence of viral mutation(s) within the areas targeted by this assay, and inadequate number of viral copies(<138 copies/mL). A negative result must be combined with clinical observations, patient history, and epidemiological information. The expected result is Negative.  Fact Sheet for Patients:  BloggerCourse.com  Fact Sheet for Healthcare Providers:  SeriousBroker.it  This test is no t yet approved or cleared by the [place] FDA and  has been authorized for detection and/or diagnosis of SARS-CoV-2 by FDA under an Emergency Use Authorization (EUA). This EUA will remain  in effect (meaning this test can be used) for the duration of the COVID-19 declaration under Section 564(b)(1) of the Act, 21 U.S.C.section 360bbb-3(b)(1), unless the authorization is terminated   or revoked sooner.       Influenza A by PCR POSITIVE (A) NEGATIVE Final   Influenza B by PCR NEGATIVE NEGATIVE Final    Comment: (NOTE) The Xpert Xpress SARS-CoV-2/FLU/RSV plus assay is intended as an aid in the diagnosis of influenza from Nasopharyngeal swab specimens and should not be used as a sole basis for treatment. Nasal washings and aspirates are unacceptable for Xpert Xpress SARS-CoV-2/FLU/RSV testing.  Fact Sheet for Patients: BloggerCourse.com  Fact Sheet for Healthcare Providers: SeriousBroker.it  This test is not yet approved or cleared by the [place] FDA and has been authorized for detection and/or diagnosis of SARS-CoV-2 by FDA under an Emergency Use Authorization (EUA). This EUA will remain in effect (meaning this test can be used) for the duration of the COVID-19 declaration under Section 564(b)(1) of the Act, 21 U.S.C. section 360bbb-3(b)(1), unless the authorization is terminated or revoked.     Resp Syncytial Virus by PCR NEGATIVE NEGATIVE Final    Comment: (NOTE) Fact Sheet for Patients: BloggerCourse.com  Fact Sheet for Healthcare Providers: SeriousBroker.it  This test is not yet approved or cleared by the [place] FDA  and has been authorized for detection and/or diagnosis of SARS-CoV-2 by FDA under an Emergency Use Authorization (EUA). This EUA will remain in effect (meaning this test can be used) for the duration of the COVID-19 declaration under Section 564(b)(1) of the Act, 21 U.S.C. section 360bbb-3(b)(1), unless the authorization is terminated or revoked.  Performed at [facility], [address] [place], [place] [address]   Culture, blood (Routine X 2) w Reflex to ID Panel     Status: None (Preliminary result)   Collection Time: 06/16/23  9:11 PM   Specimen: BLOOD  Result Value Ref Range Status   Specimen Description  BLOOD RIGHT HAND  Final   Special Requests   Final    BOTTLES DRAWN AEROBIC AND ANAEROBIC Blood Culture results may not be optimal due to an inadequate volume of blood received in culture bottles   Culture   Final    NO GROWTH 2 DAYS Performed at [facility], [address], [place], [place] [address]    Report Status PENDING  Incomplete  Culture, blood (Routine X 2) w Reflex to ID Panel     Status: None (Preliminary result)   Collection Time: 06/16/23  9:11 PM   Specimen: BLOOD  Result Value Ref Range Status   Specimen Description BLOOD RIGHT ASSIST CONTROL  Final   Special Requests   Final    BOTTLES DRAWN AEROBIC AND ANAEROBIC Blood Culture results may not be optimal due to an inadequate volume of blood received in culture bottles   Culture   Final    NO GROWTH 2 DAYS Performed at [facility], [address] [place], [place] [address]    Report Status PENDING  Incomplete         Radiology Studies: No results found.       Scheduled Meds:  ascorbic acid  500 mg Oral Daily   calcium acetate  1,334 mg Oral TID WC   carbamazepine  1,000 mg Oral q morning   Chlorhexidine Gluconate Cloth  6 each Topical Q0600   cholecalciferol  1,000 Units Oral q AM   ferrous sulfate  325 mg Oral Q breakfast   fluPHENAZine  2.5 mg Oral q AM   heparin  5,000 Units Subcutaneous Q8H   hydrALAZINE  100 mg Oral Q8H   ipratropium-albuterol  3 mL Nebulization TID   isosorbide mononitrate  60 mg Oral BID   levothyroxine  125 mcg Oral Q0600   methylPREDNISolone (SOLU-MEDROL) injection  40 mg Intravenous Q12H   mometasone-formoterol  2 puff Inhalation BID   multivitamin  1 tablet Oral QHS   nicotine  21 mg Transdermal Daily   oseltamivir  30 mg Oral Q M,W,F-2000   pantoprazole  40 mg Oral Daily   rosuvastatin  10 mg Oral QHS   trihexyphenidyl  2 mg Oral BID   zinc sulfate (50mg  elemental zinc)  220 mg Oral Daily   Continuous Infusions:   LOS: 2 days    Time  spent: 35 minutes    Delfino Lovett, MD [facility] Pager [phone]  If 7PM-7AM, please contact night-coverage [email or web address]  06/18/2023, 7:16 PM

## 2023-06-18 NOTE — Progress Notes (Signed)
Hemodialysis note  Received patient in bed to unit. Alert and oriented.  Informed consent signed and in chart.  Treatment initiated: 1323 Treatment completed: 1703  Patient tolerated well. Transported back to room, alert without acute distress.  Report given to patient's RN.   Access used: LUA AVG Access issues: none  Total UF removed: 2L Medication(s) given:  none  Post HD weight: 66.9 kg   Todd Brown [facility]

## 2023-06-18 NOTE — Progress Notes (Signed)
[facility]  ROUNDING NOTE   Subjective:   Todd Brown is a 63 year old male with past medical conditions including COPD, hypertension, DVT, schizophrenia, and end-stage renal disease on hemodialysis.  Patient presents to the emergency department with cough and shortness of breath.  Patient has been admitted for Influenza A [J10.1] COPD exacerbation (HCC) [J44.1]  Patient is known to our practice and receives outpatient dialysis treatments at [facility] on a MWF schedule, supervised by Dr. Cherylann Ratel.    Patient seen sitting up in bed Completed breakfast tray at bedside Remains on 2L [place]   Objective:  Vital signs in last 24 hours:  Temp:  [98.4 F (36.9 C)-98.7 F (37.1 C)] 98.7 F (37.1 C) (01/17 0701) Pulse Rate:  [72-91] 81 (01/17 0832) Resp:  [15-23] 18 (01/17 0832) BP: (116-162)/(68-96) 162/96 (01/17 0832) SpO2:  [88 %-100 %] 100 % (01/17 0832)  Weight change:  There were no vitals filed for this visit.  Intake/Output: I/O last 3 completed shifts: In: 230 [P.O.:230] Out: -    Intake/Output this shift:  No intake/output data recorded.  Physical Exam: General: NAD  Head: Normocephalic, atraumatic. Moist oral mucosal membranes  Eyes: Anicteric  Lungs:  Clear to auscultation, NCO 2, dry cough  Heart: Regular rate and rhythm  Abdomen:  Soft, nontender, nondistended  Extremities: No peripheral edema.  Neurologic: Alert, moving all four extremities  Skin: No lesions  Access: Left aVF    Basic Metabolic Panel: Recent Labs  Lab 06/16/23 1518 06/17/23 0405 06/18/23 0458  NA 134* 133* 133*  K 3.9 4.6 4.3  CL 89* 91* 90*  CO2 27 25 27   GLUCOSE 69* 118* 85  BUN 22 22 38*  CREATININE 5.13* 5.65* 7.66*  CALCIUM 9.7 8.7* 8.9    Liver Function Tests: Recent Labs  Lab 06/16/23 1518  AST 38  ALT 22  ALKPHOS 99  BILITOT 0.9  PROT 9.1*  ALBUMIN 4.2   No results for input(s): "LIPASE", "AMYLASE" in the last 168 hours. No results for  input(s): "AMMONIA" in the last 168 hours.  CBC: Recent Labs  Lab 06/16/23 1518 06/17/23 0405 06/18/23 0458  WBC 8.6 6.2 4.7  NEUTROABS 7.0  --   --   HGB 14.3 12.3* 11.0*  HCT 43.4 38.3* 32.9*  MCV 105.6* 108.8* 102.5*  PLT 231 179 187    Cardiac Enzymes: No results for input(s): "CKTOTAL", "CKMB", "CKMBINDEX", "TROPONINI" in the last 168 hours.  BNP: Invalid input(s): "POCBNP"  CBG: Recent Labs  Lab 06/16/23 1743 06/17/23 0103  GLUCAP 70 106*    Microbiology: Results for orders placed or performed during the hospital encounter of 06/16/23  Resp panel by RT-PCR (RSV, Flu A&B, Covid) Anterior Nasal Swab     Status: Abnormal   Collection Time: 06/16/23  3:10 PM   Specimen: Anterior Nasal Swab  Result Value Ref Range Status   SARS Coronavirus 2 by RT PCR NEGATIVE NEGATIVE Final    Comment: (NOTE) SARS-CoV-2 target nucleic acids are NOT DETECTED.  The SARS-CoV-2 RNA is generally detectable in upper respiratory specimens during the acute phase of infection. The lowest concentration of SARS-CoV-2 viral copies this assay can detect is 138 copies/mL. A negative result does not preclude SARS-Cov-2 infection and should not be used as the sole basis for treatment or other patient management decisions. A negative result may occur with  improper specimen collection/handling, submission of specimen other than nasopharyngeal swab, presence of viral mutation(s) within the areas targeted by this assay, and  inadequate number of viral copies(<138 copies/mL). A negative result must be combined with clinical observations, patient history, and epidemiological information. The expected result is Negative.  Fact Sheet for Patients:  BloggerCourse.com  Fact Sheet for Healthcare Providers:  SeriousBroker.it  This test is no t yet approved or cleared by the [place] FDA and  has been authorized for detection and/or diagnosis of  SARS-CoV-2 by FDA under an Emergency Use Authorization (EUA). This EUA will remain  in effect (meaning this test can be used) for the duration of the COVID-19 declaration under Section 564(b)(1) of the Act, 21 U.S.C.section 360bbb-3(b)(1), unless the authorization is terminated  or revoked sooner.       Influenza A by PCR POSITIVE (A) NEGATIVE Final   Influenza B by PCR NEGATIVE NEGATIVE Final    Comment: (NOTE) The Xpert Xpress SARS-CoV-2/FLU/RSV plus assay is intended as an aid in the diagnosis of influenza from Nasopharyngeal swab specimens and should not be used as a sole basis for treatment. Nasal washings and aspirates are unacceptable for Xpert Xpress SARS-CoV-2/FLU/RSV testing.  Fact Sheet for Patients: BloggerCourse.com  Fact Sheet for Healthcare Providers: SeriousBroker.it  This test is not yet approved or cleared by the [place] FDA and has been authorized for detection and/or diagnosis of SARS-CoV-2 by FDA under an Emergency Use Authorization (EUA). This EUA will remain in effect (meaning this test can be used) for the duration of the COVID-19 declaration under Section 564(b)(1) of the Act, 21 U.S.C. section 360bbb-3(b)(1), unless the authorization is terminated or revoked.     Resp Syncytial Virus by PCR NEGATIVE NEGATIVE Final    Comment: (NOTE) Fact Sheet for Patients: BloggerCourse.com  Fact Sheet for Healthcare Providers: SeriousBroker.it  This test is not yet approved or cleared by the [place] FDA and has been authorized for detection and/or diagnosis of SARS-CoV-2 by FDA under an Emergency Use Authorization (EUA). This EUA will remain in effect (meaning this test can be used) for the duration of the COVID-19 declaration under Section 564(b)(1) of the Act, 21 U.S.C. section 360bbb-3(b)(1), unless the authorization is terminated  or revoked.  Performed at [facility], [address] [place], [place] [address]   Culture, blood (Routine X 2) w Reflex to ID Panel     Status: None (Preliminary result)   Collection Time: 06/16/23  9:11 PM   Specimen: BLOOD  Result Value Ref Range Status   Specimen Description BLOOD RIGHT HAND  Final   Special Requests   Final    BOTTLES DRAWN AEROBIC AND ANAEROBIC Blood Culture results may not be optimal due to an inadequate volume of blood received in culture bottles   Culture   Final    NO GROWTH 2 DAYS Performed at [facility], [address], [place], [place] [address]    Report Status PENDING  Incomplete  Culture, blood (Routine X 2) w Reflex to ID Panel     Status: None (Preliminary result)   Collection Time: 06/16/23  9:11 PM   Specimen: BLOOD  Result Value Ref Range Status   Specimen Description BLOOD RIGHT ASSIST CONTROL  Final   Special Requests   Final    BOTTLES DRAWN AEROBIC AND ANAEROBIC Blood Culture results may not be optimal due to an inadequate volume of blood received in culture bottles   Culture   Final    NO GROWTH 2 DAYS Performed at [facility], [address], [place], [place] [address]    Report Status PENDING  Incomplete    Coagulation Studies: No results for input(s): "LABPROT", "INR" in the last 72 hours.  Urinalysis: No results for input(s): "COLORURINE", "LABSPEC", "PHURINE", "GLUCOSEU", "HGBUR", "BILIRUBINUR", "KETONESUR", "PROTEINUR", "UROBILINOGEN", "NITRITE", "LEUKOCYTESUR" in the last 72 hours.  Invalid input(s): "APPERANCEUR"    Imaging: DG Chest 2 View Result Date: 06/16/2023 CLINICAL DATA:  Shortness of breath EXAM: CHEST - 2 VIEW COMPARISON:  04/30/2023 x-ray FINDINGS: Enlarged cardiopericardial silhouette. No pneumothorax or effusion. There is again prominence of the interstitium and central vasculature with fullness in the right lung hilum and persistent nodular opacity at the left lung base.  IMPRESSION: Persistent fullness of the right lung hilum. Component of enlarged pulmonary arteries with possible although separate lymph nodes or other lesion is in the differential. Enlarged cardiac silhouette. Left retrocardiac opacity.  Possible infiltrate. Recommend follow up Electronically Signed   By: Karen Kays M.D.   On: 06/16/2023 17:13     Medications:     ascorbic acid  500 mg Oral Daily   calcium acetate  1,334 mg Oral TID WC   carbamazepine  1,000 mg Oral q morning   Chlorhexidine Gluconate Cloth  6 each Topical Q0600   cholecalciferol  1,000 Units Oral q AM   ferrous sulfate  325 mg Oral Q breakfast   fluPHENAZine  2.5 mg Oral q AM   heparin  5,000 Units Subcutaneous Q8H   hydrALAZINE  100 mg Oral Q8H   ipratropium-albuterol  3 mL Nebulization TID   isosorbide mononitrate  60 mg Oral BID   levothyroxine  125 mcg Oral Q0600   methylPREDNISolone (SOLU-MEDROL) injection  40 mg Intravenous Q12H   mometasone-formoterol  2 puff Inhalation BID   multivitamin  1 tablet Oral QHS   nicotine  21 mg Transdermal Daily   oseltamivir  30 mg Oral Q M,W,F-2000   pantoprazole  40 mg Oral Daily   rosuvastatin  10 mg Oral QHS   trihexyphenidyl  2 mg Oral BID   zinc sulfate (50mg  elemental zinc)  220 mg Oral Daily   acetaminophen, albuterol, dextromethorphan-guaiFENesin, dextrose, diphenhydrAMINE, heparin, hydrALAZINE, hyoscyamine, lidocaine-prilocaine, pentafluoroprop-tetrafluoroeth, polyethylene glycol, traMADol  Assessment/ Plan:  Mr. JOZEPH ROMER is a 63 y.o.  male  with past medical conditions including COPD, hypertension, DVT, schizophrenia, and end-stage renal disease on hemodialysis.  Patient presents to the emergency department with cough and shortness of breath.  Patient has been admitted for Influenza A [J10.1] COPD exacerbation (HCC) [J44.1]  [facility]/MWF/left aVF   End-stage renal disease on hemodialysis.  Patient received 1 hour of treatment yesterday prior  to ED presentation.  Will plan for dialysis later today.   2. Anemia of chronic kidney disease Lab Results  Component Value Date   HGB 11.0 (L) 06/18/2023    Hemoglobin at goal.  Patient does receive Mircera at outpatient clinic.  3. Secondary Hyperparathyroidism: with outpatient labs: PTH 356, phosphorus 4.1, calcium 8.9 on 06/14/23.   Lab Results  Component Value Date   PTH 194 (H) 05/11/2017   CALCIUM 8.9 06/18/2023   CAION 1.19 03/27/2022   PHOS 5.6 (H) 05/03/2023    Patient prescribed calcium acetate and cholecalciferol outpatient. These have been continued during this admission.  4.  Hypertension with chronic kidney disease.  Home regimen includes hydralazine and isosorbide.  Both currently prescribed. Blood pressure 162/96 this morning.     LOS: 2 Arav Bannister 1/17/202512:54 PM

## 2023-06-18 NOTE — TOC Progression Note (Signed)
Transition of Care [facility]) - Progression Note    Patient Details  Name: Todd Brown MRN: [id number] Date of Birth: August 04, 1960  Transition [facility]) CM/SW Contact  Chapman Fitch, RN Phone Number: 06/18/2023, 12:58 PM  Clinical Narrative:     Previous TOC note states that patient resides at [facility]  Today I spoke with Doristine Mango at [facility] and he confirms that he is one of their residents.  He has a WC, RW, and O2 at the ALF.  Patient not currently active with home health services.  Plan for patient to have HD today, and DC tomorrow.  Doristine Mango states that patient should be able to return tomorrow and will need EMS transport.  Fl2 will need to be faxed to [phone], and report called to [facility] at [phone]  Expected Discharge Plan: Group Home Barriers to Discharge: Continued Medical Work up  Expected Discharge Plan and Services       Living arrangements for the past 2 months: Group Home                                       Social Determinants of Health (SDOH) Interventions SDOH Screenings   Food Insecurity: No Food Insecurity (06/18/2023)  Housing: Unknown (06/18/2023)  Transportation Needs: Patient Unable To Answer (06/18/2023)  Utilities: Patient Unable To Answer (06/18/2023)  Alcohol Screen: Low Risk  (10/05/2019)  Financial Resource Strain: Low Risk  (10/08/2018)  Physical Activity: Unknown (10/08/2018)  Social Connections: Unknown (10/08/2018)  Stress: No Stress Concern Present (10/08/2018)  Tobacco Use: Low Risk  (06/16/2023)    Readmission Risk Interventions    06/17/2023    2:42 PM 11/05/2022    4:39 PM 09/17/2022   12:00 PM  Readmission Risk Prevention Plan  Transportation Screening Complete Complete Complete  Medication Review Oceanographer) Complete Complete Complete  PCP or Specialist appointment within 3-5 days of discharge Complete    HRI or Home Care Consult  Complete Complete  SW Recovery Care/Counseling Consult Complete Complete    Palliative Care Screening Not Applicable    Skilled Nursing Facility Not Applicable Not Applicable Not Applicable

## 2023-06-19 DIAGNOSIS — J101 Influenza due to other identified influenza virus with other respiratory manifestations: Secondary | ICD-10-CM | POA: Diagnosis not present

## 2023-06-19 DIAGNOSIS — E039 Hypothyroidism, unspecified: Secondary | ICD-10-CM | POA: Diagnosis not present

## 2023-06-19 DIAGNOSIS — F203 Undifferentiated schizophrenia: Secondary | ICD-10-CM

## 2023-06-19 DIAGNOSIS — J449 Chronic obstructive pulmonary disease, unspecified: Secondary | ICD-10-CM | POA: Diagnosis not present

## 2023-06-19 LAB — CBC
HCT: 37.1 % — ABNORMAL LOW (ref 39.0–52.0)
Hemoglobin: 12.6 g/dL — ABNORMAL LOW (ref 13.0–17.0)
MCH: 34.1 pg — ABNORMAL HIGH (ref 26.0–34.0)
MCHC: 34 g/dL (ref 30.0–36.0)
MCV: 100.5 fL — ABNORMAL HIGH (ref 80.0–100.0)
Platelets: 185 10*3/uL (ref 150–400)
RBC: 3.69 MIL/uL — ABNORMAL LOW (ref 4.22–5.81)
RDW: 13.6 % (ref 11.5–15.5)
WBC: 5.1 10*3/uL (ref 4.0–10.5)
nRBC: 0 % (ref 0.0–0.2)

## 2023-06-19 LAB — BASIC METABOLIC PANEL
Anion gap: 15 (ref 5–15)
BUN: 29 mg/dL — ABNORMAL HIGH (ref 8–23)
CO2: 26 mmol/L (ref 22–32)
Calcium: 9.1 mg/dL (ref 8.9–10.3)
Chloride: 92 mmol/L — ABNORMAL LOW (ref 98–111)
Creatinine, Ser: 6.01 mg/dL — ABNORMAL HIGH (ref 0.61–1.24)
GFR, Estimated: 10 mL/min — ABNORMAL LOW (ref >60)
Glucose, Bld: 82 mg/dL (ref 70–99)
Potassium: 4 mmol/L (ref 3.5–5.1)
Sodium: 133 mmol/L — ABNORMAL LOW (ref 135–145)

## 2023-06-19 MED ORDER — ISOSORBIDE MONONITRATE ER 60 MG PO TB24: 60.0000 mg | ORAL_TABLET | Freq: Every day | ORAL | Status: DC

## 2023-06-19 MED ORDER — OSELTAMIVIR PHOSPHATE 30 MG PO CAPS: 30.0000 mg | ORAL_CAPSULE | ORAL | 0 refills | Status: DC

## 2023-06-19 NOTE — Plan of Care (Signed)
  Problem: Education: Goal: Knowledge of disease or condition will improve Outcome: Progressing Goal: Knowledge of the prescribed therapeutic regimen will improve Outcome: Progressing Goal: Individualized Educational Video(s) Outcome: Progressing   Problem: Activity: Goal: Ability to tolerate increased activity will improve Outcome: Progressing Goal: Will verbalize the importance of balancing activity with adequate rest periods Outcome: Progressing   Problem: Respiratory: Goal: Ability to maintain a clear airway will improve Outcome: Progressing Goal: Levels of oxygenation will improve Outcome: Progressing Goal: Ability to maintain adequate ventilation will improve Outcome: Progressing   Problem: Education: Goal: Knowledge of General Education information will improve Description: Including pain rating scale, medication(s)/side effects and non-pharmacologic comfort measures Outcome: Progressing   Problem: Clinical Measurements: Goal: Ability to maintain clinical measurements within normal limits will improve Outcome: Progressing Goal: Will remain free from infection Outcome: Progressing Goal: Diagnostic test results will improve Outcome: Progressing Goal: Respiratory complications will improve Outcome: Progressing Goal: Cardiovascular complication will be avoided Outcome: Progressing   Problem: Nutrition: Goal: Adequate nutrition will be maintained Outcome: Progressing   Problem: Activity: Goal: Risk for activity intolerance will decrease Outcome: Progressing   Problem: Coping: Goal: Level of anxiety will decrease Outcome: Progressing   Problem: Elimination: Goal: Will not experience complications related to bowel motility Outcome: Progressing Goal: Will not experience complications related to urinary retention Outcome: Progressing   Problem: Safety: Goal: Ability to remain free from injury will improve Outcome: Progressing

## 2023-06-19 NOTE — Discharge Summary (Addendum)
Physician Discharge Summary   Patient: Todd Brown MRN: [id number] DOB: 1960/10/26  Admit date:     06/16/2023  Discharge date: 06/19/23  Discharge Physician: Delfino Lovett   PCP: Smiley Houseman, NP   Recommendations at discharge:   Follow-up with outpatient providers as requested  Discharge Diagnoses: Principal Problem:   Influenza A Active Problems:   COPD (chronic obstructive pulmonary disease) (HCC)   ESRD on hemodialysis (HCC)   Essential hypertension   CAD (coronary artery disease)   Hypothyroidism   Schizophrenia (HCC)   Blindness of both eyes  Hospital Course: Assessment and Plan:  63 y.o. male with medical history significant of ESRD-HD (MWF), schizophrenia, HTN, HLD, COPD on 2 L oxygen, CAD, hypothyroidism, GERD, remote right leg DVT 2018 not on anticoagulants currently, cardiac arrest, strep bacteremia with negative TEE, blindness, presents with shortness of breath   1/16: Palliative care consult 1/17: PT, OT eval - HHOT ,PT    Influenza A:  CXR showed some left retrocardiac opacity, possible infiltrate.  Patient does not have leukocytosis, patient received 1 dose of vancomycin, cefepime and azithromycin in ED.  No further need of antibiotics. no acute respiratory distress.   -Continue Tamiflu 30 mg twice daily for 2 more days He was treated with steroids, Mucinex, incentive spirometry, bronchodilators and his breathing is back to baseline.  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 Component of enlarged pulmonary arteries with possible although  separate lymph nodes or other lesion is in the differential. -Need to follow-up with PCP to repeat image after Flu A is treated.   Goals of care Palliative care seen.  Continue ongoing goals of care conversation as an outpatient          Disposition: Group home Diet recommendation:  Discharge Diet Orders (From admission, onward)     Start     Ordered   06/19/23 0000  Diet - low sodium heart healthy        06/19/23 1033           Carb modified diet DISCHARGE MEDICATION: Allergies as of 06/19/2023       Reactions   Chlorpromazine Other (See Comments)   Reaction:  Unknown , pt states it makes him feel real bad Reaction:  Unknown , pt states it makes him feel real bad        Medication List     STOP taking these medications    ascorbic acid 500 MG tablet Commonly known as: VITAMIN C   fluPHENAZine 2.5 MG tablet Commonly known as: PROLIXIN   naproxen 500 MG tablet Commonly known as: NAPROSYN   traMADol 50 MG tablet Commonly known as: ULTRAM       TAKE these medications    albuterol 108 (90 Base) MCG/ACT inhaler Commonly known as: VENTOLIN HFA Inhale 1-2 puffs into the lungs every 4 (four) hours as needed for wheezing or shortness of breath.   b complex-vitamin c-folic acid 0.8 MG Tabs tablet Take 1 tablet by mouth at bedtime.   calcium acetate 667 MG capsule Commonly known as: PHOSLO Take 1,334 mg by mouth 3 (three) times daily with meals. 0800/1400/2000  carbamazepine 200 MG tablet Commonly known as: TEGRETOL Take 1,000 mg by mouth every morning.   cholecalciferol 1000 units tablet Commonly known as: VITAMIN D Take 1,000 Units by mouth in the morning.   ferrous sulfate 325 (65 FE) MG tablet Take 1 tablet (325 mg total) by mouth daily.   Fluticasone-Salmeterol 250-50 MCG/DOSE Aepb Commonly known as: ADVAIR Inhale 1 puff into the lungs 2 (two) times daily.   guaiFENesin-dextromethorphan 100-10 MG/5ML syrup Commonly known as: ROBITUSSIN  DM Take 5 mLs by mouth every 4 (four) hours as needed for cough.   hydrALAZINE 100 MG tablet Commonly known as: APRESOLINE Take 1 tablet (100 mg total) by mouth every 8 (eight) hours.   hyoscyamine 0.125 MG SL tablet Commonly known as: LEVSIN SL Place 0.125 mg under the tongue every 4 (four) hours as needed for cramping.   ipratropium-albuterol 0.5-2.5 (3) MG/3ML Soln Commonly known as: DUONEB Take 3 mLs by nebulization every 6 (six) hours as needed.   isosorbide mononitrate 60 MG 24 hr tablet Commonly known as: IMDUR Take 1 tablet (60 mg total) by mouth daily. What changed: when to take this   levothyroxine 125 MCG tablet Commonly known as: Synthroid Take 1 tablet (125 mcg total) by mouth daily.   nicotine 21 mg/24hr patch Commonly known as: NICODERM CQ - dosed in mg/24 hours Place 1 patch (21 mg total) onto the skin daily.   omeprazole 40 MG capsule Commonly known as: PRILOSEC Take 40 mg by mouth every morning.   oseltamivir 30 MG capsule Commonly known as: TAMIFLU Take 1 capsule (30 mg total) by mouth every Monday, Wednesday, and Friday. Start taking on: June 21, 2023   polyethylene glycol 17 g packet Commonly known as: MIRALAX / GLYCOLAX Take 17 g by mouth daily as needed for mild constipation.   rosuvastatin 10 MG tablet Commonly known as: CRESTOR Take 10 mg by mouth at bedtime.   trihexyphenidyl 2 MG tablet Commonly known as: ARTANE Take 2 mg by mouth 2 (two) times daily.   zinc sulfate (50mg  elemental zinc) 220 (50 Zn) MG capsule Take 220 mg by mouth daily.        Discharge Exam: Filed Weights   06/18/23 1320 06/18/23 1703 06/19/23 0500  Weight: 68.6 kg 66.9 kg 67.4 kg   General exam: Appears calm and comfortable  Respiratory system: Clear to auscultation. Respiratory effort normal. Cardiovascular system: S1 & S2 heard, RRR.  Gastrointestinal system: Abdomen is soft, benign Central nervous system: Alert and awake, nonfocal Extremities:  Symmetric 5 x 5 power. Skin: No rashes, lesions or ulcers Access: Left AV fistula  Condition at discharge: fair  The results of significant diagnostics from this hospitalization (including imaging, microbiology, ancillary and laboratory) are listed below for reference.   Imaging Studies: DG Chest 2 View Result Date: 06/16/2023 CLINICAL DATA:  Shortness of breath EXAM: CHEST - 2 VIEW COMPARISON:  04/30/2023 x-ray FINDINGS: Enlarged cardiopericardial silhouette. No pneumothorax or effusion. There is again prominence of the interstitium and central vasculature with fullness in the right lung hilum and persistent nodular opacity at the left lung base. IMPRESSION: Persistent fullness of the right lung hilum. Component of enlarged pulmonary arteries with possible although separate lymph nodes or other lesion is in the differential. Enlarged cardiac silhouette. Left retrocardiac opacity.  Possible infiltrate. Recommend follow up Electronically Signed   By: Karen Kays M.D.   On: 06/16/2023 17:13    Microbiology: Results for orders placed or performed during the hospital encounter of 06/16/23  Resp panel by RT-PCR (RSV, Flu A&B, Covid) Anterior Nasal Swab     Status: Abnormal   Collection Time: 06/16/23  3:10 PM   Specimen: Anterior Nasal Swab  Result Value Ref Range Status   SARS Coronavirus 2 by RT PCR NEGATIVE NEGATIVE Final    Comment: (NOTE) SARS-CoV-2 target nucleic acids are NOT DETECTED.  The SARS-CoV-2 RNA is generally detectable in upper respiratory specimens during the acute phase of infection. The lowest concentration of SARS-CoV-2 viral copies this assay can detect is 138 copies/mL. A negative result does not preclude SARS-Cov-2 infection and should not be used as the sole basis for treatment or other patient management decisions. A negative result may occur with  improper specimen collection/handling, submission of specimen other than nasopharyngeal swab, presence of viral  mutation(s) within the areas targeted by this assay, and inadequate number of viral copies(<138 copies/mL). A negative result must be combined with clinical observations, patient history, and epidemiological information. The expected result is Negative.  Fact Sheet for Patients:  BloggerCourse.com  Fact Sheet for Healthcare Providers:  SeriousBroker.it  This test is no t yet approved or cleared by the [place] FDA and  has been authorized for detection and/or diagnosis of SARS-CoV-2 by FDA under an Emergency Use Authorization (EUA). This EUA will remain  in effect (meaning this test can be used) for the duration of the COVID-19 declaration under Section 564(b)(1) of the Act, 21 U.S.C.section 360bbb-3(b)(1), unless the authorization is terminated  or revoked sooner.       Influenza A by PCR POSITIVE (A) NEGATIVE Final   Influenza B by PCR NEGATIVE NEGATIVE Final    Comment: (NOTE) The Xpert Xpress SARS-CoV-2/FLU/RSV plus assay is intended as an aid in the diagnosis of influenza from Nasopharyngeal swab specimens and should not be used as a sole basis for treatment. Nasal washings and aspirates are unacceptable for Xpert Xpress SARS-CoV-2/FLU/RSV testing.  Fact Sheet for Patients: BloggerCourse.com  Fact Sheet for Healthcare Providers: SeriousBroker.it  This test is not yet approved or cleared by the [place] FDA and has been authorized for detection and/or diagnosis of SARS-CoV-2 by FDA under an Emergency Use Authorization (EUA). This EUA will remain in effect (meaning this test can be used) for the duration of the COVID-19 declaration under Section 564(b)(1) of the Act, 21 U.S.C. section 360bbb-3(b)(1), unless the authorization is terminated or revoked.     Resp Syncytial Virus by PCR NEGATIVE NEGATIVE Final    Comment: (NOTE) Fact Sheet for  Patients: BloggerCourse.com  Fact Sheet for Healthcare Providers: SeriousBroker.it  This test is not yet approved or cleared by the [place] FDA and has been authorized for detection and/or diagnosis of SARS-CoV-2 by FDA under an Emergency Use Authorization (EUA). This EUA will remain in effect (meaning this test can be used) for the duration of the COVID-19 declaration under Section 564(b)(1) of the Act, 21 U.S.C. section 360bbb-3(b)(1), unless the authorization is terminated or revoked.  Performed at [facility], [address] [place], [place] [address]   Culture, blood (Routine X 2) w Reflex to ID Panel     Status: None (Preliminary result)   Collection Time: 06/16/23  9:11 PM   Specimen: BLOOD  Result Value Ref Range Status   Specimen Description BLOOD RIGHT HAND  Final   Special Requests   Final    BOTTLES DRAWN AEROBIC AND ANAEROBIC Blood Culture results may not be optimal due to an inadequate volume of blood received in culture bottles  Culture   Final    NO GROWTH 3 DAYS Performed at [facility], [address] [place], [place] [address]    Report Status PENDING  Incomplete  Culture, blood (Routine X 2) w Reflex to ID Panel     Status: None (Preliminary result)   Collection Time: 06/16/23  9:11 PM   Specimen: BLOOD  Result Value Ref Range Status   Specimen Description BLOOD RIGHT ASSIST CONTROL  Final   Special Requests   Final    BOTTLES DRAWN AEROBIC AND ANAEROBIC Blood Culture results may not be optimal due to an inadequate volume of blood received in culture bottles   Culture   Final    NO GROWTH 3 DAYS Performed at [facility], [address] [place], [place] [address]    Report Status PENDING  Incomplete    Labs: CBC: Recent Labs  Lab 06/16/23 1518 06/17/23 0405 06/18/23 0458 06/19/23 0623  WBC 8.6 6.2 4.7 5.1  NEUTROABS 7.0  --   --   --   HGB 14.3 12.3*  11.0* 12.6*  HCT 43.4 38.3* 32.9* 37.1*  MCV 105.6* 108.8* 102.5* 100.5*  PLT 231 179 187 185   Basic Metabolic Panel: Recent Labs  Lab 06/16/23 1518 06/17/23 0405 06/18/23 0458 06/19/23 0623  NA 134* 133* 133* 133*  K 3.9 4.6 4.3 4.0  CL 89* 91* 90* 92*  CO2 27 25 27 26   GLUCOSE 69* 118* 85 82  BUN 22 22 38* 29*  CREATININE 5.13* 5.65* 7.66* 6.01*  CALCIUM 9.7 8.7* 8.9 9.1   Liver Function Tests: Recent Labs  Lab 06/16/23 1518  AST 38  ALT 22  ALKPHOS 99  BILITOT 0.9  PROT 9.1*  ALBUMIN 4.2   CBG: Recent Labs  Lab 06/16/23 1743 06/17/23 0103  GLUCAP 70 106*    Discharge time spent: greater than 30 minutes.  Signed: Delfino Lovett, MD [facility] 06/19/2023

## 2023-06-19 NOTE — NC FL2 (Signed)
[place] MEDICAID FL2 LEVEL OF CARE FORM     IDENTIFICATION  Patient Name: Todd Brown Birthdate: May 15, 1961 Sex: male Admission Date (Current Location): 06/16/2023  [place] and [place] Number:  Chiropodist and Address:  [facility], [address] [place], [place] [address]      Provider Number: [id number]  Attending Physician Name and Address:  Delfino Lovett, MD  Relative Name and Phone Number:  Gobble,Candice (Legal Guardian)  [phone] (Mobile)    Current Level of Care: Hospital Recommended Level of Care: Assisted Living Facility Prior Approval Number:    Date Approved/Denied:   PASRR Number: [id number]  Discharge Plan:      Current Diagnoses: Patient Active Problem List   Diagnosis Date Noted   Influenza A 06/16/2023   Acute hypoxemic respiratory failure (HCC) 04/30/2023   Blindness of both eyes 04/30/2023   Elevated troponin 04/30/2023   Low back pain 04/30/2023   Productive cough 04/30/2023   Myocardial injury 11/03/2022   CAD (coronary artery disease) 11/03/2022   Hypothyroidism 11/03/2022   Thrombocytopenia (HCC) 11/03/2022   Aspiration pneumonia (HCC) 11/03/2022   Abdominal distension 11/03/2022   HTN (hypertension) 11/03/2022   Pericardial effusion 11/03/2022   CAP (community acquired pneumonia) 09/14/2022   Anasarca (mild pulmonary edema, pleural and pericardial effusion, ascites on CT) 09/14/2022   Acute on chronic respiratory failure (HCC) 09/14/2022   Bacteremia 06/20/2022   Acute encephalopathy 06/19/2022   SIRS (systemic inflammatory response syndrome) (HCC) 06/19/2022   NSTEMI (non-ST elevated myocardial infarction) (HCC) 06/19/2022   Acute on chronic respiratory failure with hypoxia (HCC) 05/21/2022   Hypoxia 05/20/2022   COVID-19 virus infection 05/20/2022   Hyponatremia 02/10/2022   Anemia due to chronic kidney disease 01/21/2022   Hyperkalemia 01/14/2022   Malnutrition of moderate degree  12/24/2021   Oral thrush 12/22/2021   Physical deconditioning 12/20/2021   Acute metabolic encephalopathy 12/15/2021   Essential hypertension 12/15/2021   ESRD on hemodialysis (HCC) 12/15/2021   Altered mental status 10/08/2021   Hypertensive urgency 10/07/2021   COPD (chronic obstructive pulmonary disease) (HCC) 02/26/2021   GERD (gastroesophageal reflux disease) 02/26/2021   Hypertension 11/28/2020   Acute respiratory failure with hypoxia (HCC) 10/23/2019   Anemia of chronic disease 10/23/2019   Fluid overload 10/08/2018   Acute pulmonary edema (HCC) 09/12/2018   Scrotal edema 05/10/2017   Symptomatic anemia 04/30/2017   Anticoagulated on Coumadin 09/14/2016   Cardiac arrest (HCC) 08/03/2016   Severe sepsis versus SIRS 05/26/2016   Dialysis patient (HCC) 04/15/2016   End stage renal disease (HCC) 02/12/2016   Hyperkalemia 05/20/2015   Hepatitis C 09/26/2013   Schizophrenia (HCC) 03/23/2013   Essential hypertension, benign 03/23/2013   Chronic kidney disease 03/23/2013    Orientation RESPIRATION BLADDER Height & Weight     Self, Situation, Place  O2 Continent (anuria) Weight: 148 lb 9.4 oz (67.4 kg) Height:     BEHAVIORAL SYMPTOMS/MOOD NEUROLOGICAL BOWEL NUTRITION STATUS        Diet (Diet renal with fluid restriction Fluid restriction: 1200 mL Fluid; Room service appropriate? Yes; Fluid consistency: Thin)  AMBULATORY STATUS COMMUNICATION OF NEEDS Skin   Supervision Verbally Normal                       Personal Care Assistance Level of Assistance  Bathing, Feeding, Dressing Bathing Assistance: Limited assistance Feeding assistance: Limited assistance Dressing Assistance: Limited assistance     Functional Limitations Info  SPECIAL CARE FACTORS FREQUENCY                       Contractures      Additional Factors Info  Code Status, Allergies Code Status Info: full Allergies Info: Chlorpromazine             Medication List        STOP taking these medications     ascorbic acid 500 MG tablet Commonly known as: VITAMIN C    fluPHENAZine 2.5 MG tablet Commonly known as: PROLIXIN    naproxen 500 MG tablet Commonly known as: NAPROSYN    traMADol 50 MG tablet Commonly known as: ULTRAM           TAKE these medications     albuterol 108 (90 Base) MCG/ACT inhaler Commonly known as: VENTOLIN HFA Inhale 1-2 puffs into the lungs every 4 (four) hours as needed for wheezing or shortness of breath.    b complex-vitamin c-folic acid 0.8 MG Tabs tablet Take 1 tablet by mouth at bedtime.    calcium acetate 667 MG capsule Commonly known as: PHOSLO Take 1,334 mg by mouth 3 (three) times daily with meals. 0800/1400/2000    carbamazepine 200 MG tablet Commonly known as: TEGRETOL Take 1,000 mg by mouth every morning.    cholecalciferol 1000 units tablet Commonly known as: VITAMIN D Take 1,000 Units by mouth in the morning.    ferrous sulfate 325 (65 FE) MG tablet Take 1 tablet (325 mg total) by mouth daily.    Fluticasone-Salmeterol 250-50 MCG/DOSE Aepb Commonly known as: ADVAIR Inhale 1 puff into the lungs 2 (two) times daily.    guaiFENesin-dextromethorphan 100-10 MG/5ML syrup Commonly known as: ROBITUSSIN DM Take 5 mLs by mouth every 4 (four) hours as needed for cough.    hydrALAZINE 100 MG tablet Commonly known as: APRESOLINE Take 1 tablet (100 mg total) by mouth every 8 (eight) hours.    hyoscyamine 0.125 MG SL tablet Commonly known as: LEVSIN SL Place 0.125 mg under the tongue every 4 (four) hours as needed for cramping.    ipratropium-albuterol 0.5-2.5 (3) MG/3ML Soln Commonly known as: DUONEB Take 3 mLs by nebulization every 6 (six) hours as needed.    isosorbide mononitrate 60 MG 24 hr tablet Commonly known as: IMDUR Take 1 tablet (60 mg total) by mouth daily. What changed: when to take this    levothyroxine 125 MCG tablet Commonly known as: Synthroid Take 1 tablet (125 mcg total) by  mouth daily.    nicotine 21 mg/24hr patch Commonly known as: NICODERM CQ - dosed in mg/24 hours Place 1 patch (21 mg total) onto the skin daily.    omeprazole 40 MG capsule Commonly known as: PRILOSEC Take 40 mg by mouth every morning.    oseltamivir 30 MG capsule Commonly known as: TAMIFLU Take 1 capsule (30 mg total) by mouth every Monday, Wednesday, and Friday. Start taking on: June 21, 2023    polyethylene glycol 17 g packet Commonly known as: MIRALAX / GLYCOLAX Take 17 g by mouth daily as needed for mild constipation.    rosuvastatin 10 MG tablet Commonly known as: CRESTOR Take 10 mg by mouth at bedtime.    trihexyphenidyl 2 MG tablet Commonly known as: ARTANE Take 2 mg by mouth 2 (two) times daily.    zinc sulfate (50mg  elemental zinc) 220 (50 Zn) MG capsule Take 220 mg by mouth daily.      Relevant Imaging Results:  Relevant Lab  Results:   Additional Information SS #: [id number]  Makinze Jani E Jessica Seidman, LCSW

## 2023-06-19 NOTE — Progress Notes (Signed)
[facility]  ROUNDING NOTE   Subjective:   Todd Brown is a 63 year old male with past medical conditions including COPD, hypertension, DVT, schizophrenia, and end-stage renal disease on hemodialysis.  Patient presents to the emergency department with cough and shortness of breath.  Patient has been admitted for Influenza A [J10.1] COPD exacerbation (HCC) [J44.1]   Patient is known to our practice and receives outpatient dialysis treatments at [facility] on a MWF schedule, supervised by Dr. Cherylann Ratel.   Update: Patient seen ambulating with assistance to bedside commode. Continues on 2L [place]. No complaints. Discharged delayed due to transportation.  Objective:  Vital signs in last 24 hours:  Temp:  [98 F (36.7 C)-99.1 F (37.3 C)] 98 F (36.7 C) (01/18 0845) Pulse Rate:  [57-88] 84 (01/18 0845) Resp:  [14-20] 18 (01/18 0845) BP: (112-153)/(74-91) 125/91 (01/18 0845) SpO2:  [94 %-99 %] 98 % (01/18 0845) Weight:  [66.9 kg-68.6 kg] 67.4 kg (01/18 0500)  Weight change:  Filed Weights   06/18/23 1320 06/18/23 1703 06/19/23 0500  Weight: 68.6 kg 66.9 kg 67.4 kg    Intake/Output: I/O last 3 completed shifts: In: -  Out: 2000 [Other:2000]   Intake/Output this shift:  No intake/output data recorded.  Physical Exam: General: NAD,   Head: Normocephalic, atraumatic. Moist oral mucosal membranes  Eyes: Anicteric, PERRL  Neck: Supple, trachea midline  Lungs:  2l ,Trace rales to auscultation  Heart: Regular rate and rhythm  Abdomen:  Soft, nontender,   Extremities:  No peripheral edema.  Neurologic: Nonfocal, moving all four extremities  Skin: No lesions  Access: Left AVF    Basic Metabolic Panel: Recent Labs  Lab 06/16/23 1518 06/17/23 0405 06/18/23 0458 06/19/23 0623  NA 134* 133* 133* 133*  K 3.9 4.6 4.3 4.0  CL 89* 91* 90* 92*  CO2 27 25 27 26   GLUCOSE 69* 118* 85 82  BUN 22 22 38* 29*  CREATININE 5.13* 5.65* 7.66* 6.01*  CALCIUM 9.7 8.7*  8.9 9.1    Liver Function Tests: Recent Labs  Lab 06/16/23 1518  AST 38  ALT 22  ALKPHOS 99  BILITOT 0.9  PROT 9.1*  ALBUMIN 4.2   No results for input(s): "LIPASE", "AMYLASE" in the last 168 hours. No results for input(s): "AMMONIA" in the last 168 hours.  CBC: Recent Labs  Lab 06/16/23 1518 06/17/23 0405 06/18/23 0458 06/19/23 0623  WBC 8.6 6.2 4.7 5.1  NEUTROABS 7.0  --   --   --   HGB 14.3 12.3* 11.0* 12.6*  HCT 43.4 38.3* 32.9* 37.1*  MCV 105.6* 108.8* 102.5* 100.5*  PLT 231 179 187 185    Cardiac Enzymes: No results for input(s): "CKTOTAL", "CKMB", "CKMBINDEX", "TROPONINI" in the last 168 hours.  BNP: Invalid input(s): "POCBNP"  CBG: Recent Labs  Lab 06/16/23 1743 06/17/23 0103  GLUCAP 70 106*    Microbiology: Results for orders placed or performed during the hospital encounter of 06/16/23  Resp panel by RT-PCR (RSV, Flu A&B, Covid) Anterior Nasal Swab     Status: Abnormal   Collection Time: 06/16/23  3:10 PM   Specimen: Anterior Nasal Swab  Result Value Ref Range Status   SARS Coronavirus 2 by RT PCR NEGATIVE NEGATIVE Final    Comment: (NOTE) SARS-CoV-2 target nucleic acids are NOT DETECTED.  The SARS-CoV-2 RNA is generally detectable in upper respiratory specimens during the acute phase of infection. The lowest concentration of SARS-CoV-2 viral copies this assay can detect is 138 copies/mL. A negative  result does not preclude SARS-Cov-2 infection and should not be used as the sole basis for treatment or other patient management decisions. A negative result may occur with  improper specimen collection/handling, submission of specimen other than nasopharyngeal swab, presence of viral mutation(s) within the areas targeted by this assay, and inadequate number of viral copies(<138 copies/mL). A negative result must be combined with clinical observations, patient history, and epidemiological information. The expected result is Negative.  Fact  Sheet for Patients:  BloggerCourse.com  Fact Sheet for Healthcare Providers:  SeriousBroker.it  This test is no t yet approved or cleared by the [place] FDA and  has been authorized for detection and/or diagnosis of SARS-CoV-2 by FDA under an Emergency Use Authorization (EUA). This EUA will remain  in effect (meaning this test can be used) for the duration of the COVID-19 declaration under Section 564(b)(1) of the Act, 21 U.S.C.section 360bbb-3(b)(1), unless the authorization is terminated  or revoked sooner.       Influenza A by PCR POSITIVE (A) NEGATIVE Final   Influenza B by PCR NEGATIVE NEGATIVE Final    Comment: (NOTE) The Xpert Xpress SARS-CoV-2/FLU/RSV plus assay is intended as an aid in the diagnosis of influenza from Nasopharyngeal swab specimens and should not be used as a sole basis for treatment. Nasal washings and aspirates are unacceptable for Xpert Xpress SARS-CoV-2/FLU/RSV testing.  Fact Sheet for Patients: BloggerCourse.com  Fact Sheet for Healthcare Providers: SeriousBroker.it  This test is not yet approved or cleared by the [place] FDA and has been authorized for detection and/or diagnosis of SARS-CoV-2 by FDA under an Emergency Use Authorization (EUA). This EUA will remain in effect (meaning this test can be used) for the duration of the COVID-19 declaration under Section 564(b)(1) of the Act, 21 U.S.C. section 360bbb-3(b)(1), unless the authorization is terminated or revoked.     Resp Syncytial Virus by PCR NEGATIVE NEGATIVE Final    Comment: (NOTE) Fact Sheet for Patients: BloggerCourse.com  Fact Sheet for Healthcare Providers: SeriousBroker.it  This test is not yet approved or cleared by the [place] FDA and has been authorized for detection and/or diagnosis of SARS-CoV-2 by FDA  under an Emergency Use Authorization (EUA). This EUA will remain in effect (meaning this test can be used) for the duration of the COVID-19 declaration under Section 564(b)(1) of the Act, 21 U.S.C. section 360bbb-3(b)(1), unless the authorization is terminated or revoked.  Performed at [facility], [address] [place], [place] [address]   Culture, blood (Routine X 2) w Reflex to ID Panel     Status: None (Preliminary result)   Collection Time: 06/16/23  9:11 PM   Specimen: BLOOD  Result Value Ref Range Status   Specimen Description BLOOD RIGHT HAND  Final   Special Requests   Final    BOTTLES DRAWN AEROBIC AND ANAEROBIC Blood Culture results may not be optimal due to an inadequate volume of blood received in culture bottles   Culture   Final    NO GROWTH 3 DAYS Performed at [facility], [address] [place], [place] [address]    Report Status PENDING  Incomplete  Culture, blood (Routine X 2) w Reflex to ID Panel     Status: None (Preliminary result)   Collection Time: 06/16/23  9:11 PM   Specimen: BLOOD  Result Value Ref Range Status   Specimen Description BLOOD RIGHT ASSIST CONTROL  Final   Special Requests   Final    BOTTLES DRAWN AEROBIC AND  ANAEROBIC Blood Culture results may not be optimal due to an inadequate volume of blood received in culture bottles   Culture   Final    NO GROWTH 3 DAYS Performed at [facility], [address] [place], [place] [address]    Report Status PENDING  Incomplete    Coagulation Studies: No results for input(s): "LABPROT", "INR" in the last 72 hours.  Urinalysis: No results for input(s): "COLORURINE", "LABSPEC", "PHURINE", "GLUCOSEU", "HGBUR", "BILIRUBINUR", "KETONESUR", "PROTEINUR", "UROBILINOGEN", "NITRITE", "LEUKOCYTESUR" in the last 72 hours.  Invalid input(s): "APPERANCEUR"    Imaging: No results found.   Medications:     ascorbic acid  500 mg Oral Daily   calcium acetate  1,334 mg  Oral TID WC   carbamazepine  1,000 mg Oral q morning   Chlorhexidine Gluconate Cloth  6 each Topical Q0600   cholecalciferol  1,000 Units Oral q AM   ferrous sulfate  325 mg Oral Q breakfast   fluPHENAZine  2.5 mg Oral q AM   heparin  5,000 Units Subcutaneous Q8H   hydrALAZINE  100 mg Oral Q8H   ipratropium-albuterol  3 mL Nebulization TID   isosorbide mononitrate  60 mg Oral BID   levothyroxine  125 mcg Oral Q0600   methylPREDNISolone (SOLU-MEDROL) injection  40 mg Intravenous Q12H   mometasone-formoterol  2 puff Inhalation BID   multivitamin  1 tablet Oral QHS   nicotine  21 mg Transdermal Daily   oseltamivir  30 mg Oral Q M,W,F-2000   pantoprazole  40 mg Oral Daily   rosuvastatin  10 mg Oral QHS   trihexyphenidyl  2 mg Oral BID   zinc sulfate (50mg  elemental zinc)  220 mg Oral Daily   acetaminophen, albuterol, dextromethorphan-guaiFENesin, dextrose, diphenhydrAMINE, hydrALAZINE, hyoscyamine, polyethylene glycol, traMADol  Assessment/ Plan:  Mr. JOHNMATTHEW PRESTO is a 63 y.o.  male with past medical conditions including COPD, hypertension, DVT, schizophrenia, and end-stage renal disease on hemodialysis.  Patient presents to the emergency department with cough and shortness of breath.  Patient has been admitted for Influenza A [J10.1] COPD exacerbation (HCC) [J44.1]   [facility]/MWF/left aVF    End-stage renal disease on hemodialysis.  Patient received dialysis yesterday. No indication for HD today. Plan for HD as scheduled Monday   2. Anemia of chronic kidney disease Recent Labs       Lab Results  Component Value Date    HGB 12.6(L) 06/19/2023      Hemoglobin at goal.  Patient does receive Mircera at outpatient clinic.   3. Secondary Hyperparathyroidism: with outpatient labs: PTH 356, phosphorus 4.1, calcium 8.9 on 06/14/23.    Recent Labs       Lab Results  Component Value Date    PTH 194 (H) 05/11/2017    CALCIUM 9.1 06/19/2023    CAION 1.19 03/27/2022     PHOS 5.6 (H) 05/03/2023      Patient prescribed calcium acetate and cholecalciferol outpatient. These have been continued during this admission.   4.  Hypertension with chronic kidney disease.  Home regimen includes hydralazine and isosorbide.  Both currently prescribed. Blood pressure 162/96 this morning.    LOS: 3 Laurel Smeltz Tonny Bollman 1/18/202512:34 PM

## 2023-06-19 NOTE — TOC Transition Note (Addendum)
Transition of Care [name]) - Discharge Note   Patient Details  Name: Todd Brown MRN: [id number] Date of Birth: 29-Sep-1960  Transition of Care [facility]) CM/SW Contact:  Liliana Cline, LCSW Phone Number: 06/19/2023, 11:36 AM   Clinical Narrative:    Patient is discharging back to [facility] today. Confirmed with Doristine Mango at [facility]. FL2 faxed to [facility]. Hard copy placed in DC packet per Clement's request. Doristine Mango requests EMS transport. Doristine Mango also requests report be called to him - notified RN. Called Candace with [facility] DSS, spoke with her and informed her of DC today - she is agreeable. EMS paperwork completed. Will call for EMS when patient is medically ready for transport.   2:20- [facility] called for transport. Patient is 4th on the list. RN notified.  Final next level of care: Group Home Barriers to Discharge: Barriers Resolved   Patient Goals and CMS Choice Patient states their goals for this hospitalization and ongoing recovery are:: To return to the [facility]          Discharge Placement                  Name of family member notified: Legal Guardian [facility]CDSS - Candace Patient and family notified of of transfer: 06/19/23  Discharge Plan and Services Additional resources added to the After Visit Summary for                                       Social Drivers of Health (SDOH) Interventions SDOH Screenings   Food Insecurity: No Food Insecurity (06/18/2023)  Housing: Unknown (06/18/2023)  Transportation Needs: Patient Unable To Answer (06/18/2023)  Utilities: Patient Unable To Answer (06/18/2023)  Alcohol Screen: Low Risk  (10/05/2019)  Financial Resource Strain: Low Risk  (10/08/2018)  Physical Activity: Unknown (10/08/2018)  Social Connections: Unknown (10/08/2018)  Stress: No Stress Concern Present (10/08/2018)  Tobacco Use: Low Risk  (06/16/2023)     Readmission Risk Interventions    06/17/2023    2:42 PM 11/05/2022    4:39 PM 09/17/2022   12:00  PM  Readmission Risk Prevention Plan  Transportation Screening Complete Complete Complete  Medication Review Oceanographer) Complete Complete Complete  PCP or Specialist appointment within 3-5 days of discharge Complete    HRI or Home Care Consult  Complete Complete  SW Recovery Care/Counseling Consult Complete Complete   Palliative Care Screening Not Applicable    Skilled Nursing Facility Not Applicable Not Applicable Not Applicable

## 2023-06-21 LAB — CULTURE, BLOOD (ROUTINE X 2)
Culture: NO GROWTH
Culture: NO GROWTH

## 2023-07-05 ENCOUNTER — Inpatient Hospital Stay
Admission: EM | Admit: 2023-07-05 | Discharge: 2023-07-07 | DRG: 193 | Disposition: A | Discharge status: Nursing Home (Includes ICF) | Payer: MEDICAID | Source: Skilled Nursing Facility | Attending: Emergency Medicine | Admitting: Emergency Medicine

## 2023-07-05 ENCOUNTER — Emergency Department: Payer: MEDICAID

## 2023-07-05 DIAGNOSIS — N186 End stage renal disease: Secondary | ICD-10-CM

## 2023-07-05 DIAGNOSIS — J440 Chronic obstructive pulmonary disease with acute lower respiratory infection: Secondary | ICD-10-CM | POA: Diagnosis present

## 2023-07-05 DIAGNOSIS — R41841 Cognitive communication deficit: Secondary | ICD-10-CM | POA: Diagnosis present

## 2023-07-05 DIAGNOSIS — Z1152 Encounter for screening for COVID-19: Secondary | ICD-10-CM

## 2023-07-05 DIAGNOSIS — Z86718 Personal history of other venous thrombosis and embolism: Secondary | ICD-10-CM

## 2023-07-05 DIAGNOSIS — Z7951 Long term (current) use of inhaled steroids: Secondary | ICD-10-CM

## 2023-07-05 DIAGNOSIS — E785 Hyperlipidemia, unspecified: Secondary | ICD-10-CM | POA: Diagnosis present

## 2023-07-05 DIAGNOSIS — R0602 Shortness of breath: Secondary | ICD-10-CM | POA: Insufficient documentation

## 2023-07-05 DIAGNOSIS — Z91158 Patient's noncompliance with renal dialysis for other reason: Secondary | ICD-10-CM

## 2023-07-05 DIAGNOSIS — M1990 Unspecified osteoarthritis, unspecified site: Secondary | ICD-10-CM | POA: Diagnosis present

## 2023-07-05 DIAGNOSIS — R079 Chest pain, unspecified: Secondary | ICD-10-CM

## 2023-07-05 DIAGNOSIS — Z79890 Hormone replacement therapy (postmenopausal): Secondary | ICD-10-CM

## 2023-07-05 DIAGNOSIS — I120 Hypertensive chronic kidney disease with stage 5 chronic kidney disease or end stage renal disease: Secondary | ICD-10-CM | POA: Diagnosis present

## 2023-07-05 DIAGNOSIS — Z79899 Other long term (current) drug therapy: Secondary | ICD-10-CM

## 2023-07-05 DIAGNOSIS — I10 Essential (primary) hypertension: Secondary | ICD-10-CM | POA: Diagnosis present

## 2023-07-05 DIAGNOSIS — J9601 Acute respiratory failure with hypoxia: Principal | ICD-10-CM | POA: Insufficient documentation

## 2023-07-05 DIAGNOSIS — Z992 Dependence on renal dialysis: Secondary | ICD-10-CM

## 2023-07-05 DIAGNOSIS — E039 Hypothyroidism, unspecified: Secondary | ICD-10-CM | POA: Diagnosis present

## 2023-07-05 DIAGNOSIS — J449 Chronic obstructive pulmonary disease, unspecified: Secondary | ICD-10-CM | POA: Diagnosis present

## 2023-07-05 DIAGNOSIS — J9621 Acute and chronic respiratory failure with hypoxia: Secondary | ICD-10-CM | POA: Diagnosis present

## 2023-07-05 DIAGNOSIS — R0902 Hypoxemia: Secondary | ICD-10-CM | POA: Diagnosis present

## 2023-07-05 DIAGNOSIS — Z841 Family history of disorders of kidney and ureter: Secondary | ICD-10-CM

## 2023-07-05 DIAGNOSIS — J189 Pneumonia, unspecified organism: Principal | ICD-10-CM | POA: Diagnosis present

## 2023-07-05 DIAGNOSIS — A419 Sepsis, unspecified organism: Secondary | ICD-10-CM | POA: Insufficient documentation

## 2023-07-05 DIAGNOSIS — Z888 Allergy status to other drugs, medicaments and biological substances status: Secondary | ICD-10-CM

## 2023-07-05 DIAGNOSIS — I2510 Atherosclerotic heart disease of native coronary artery without angina pectoris: Secondary | ICD-10-CM | POA: Diagnosis present

## 2023-07-05 DIAGNOSIS — F209 Schizophrenia, unspecified: Secondary | ICD-10-CM | POA: Diagnosis present

## 2023-07-05 LAB — CBC WITH DIFFERENTIAL/PLATELET
Abs Immature Granulocytes: 0.03 10*3/uL (ref 0.00–0.07)
Basophils Absolute: 0 10*3/uL (ref 0.0–0.1)
Basophils Relative: 0 %
Eosinophils Absolute: 0.1 10*3/uL (ref 0.0–0.5)
Eosinophils Relative: 2 %
HCT: 33.9 % — ABNORMAL LOW (ref 39.0–52.0)
Hemoglobin: 11.4 g/dL — ABNORMAL LOW (ref 13.0–17.0)
Immature Granulocytes: 0 %
Lymphocytes Relative: 10 %
Lymphs Abs: 0.8 10*3/uL (ref 0.7–4.0)
MCH: 35.2 pg — ABNORMAL HIGH (ref 26.0–34.0)
MCHC: 33.6 g/dL (ref 30.0–36.0)
MCV: 104.6 fL — ABNORMAL HIGH (ref 80.0–100.0)
Monocytes Absolute: 0.7 10*3/uL (ref 0.1–1.0)
Monocytes Relative: 10 %
Neutro Abs: 5.8 10*3/uL (ref 1.7–7.7)
Neutrophils Relative %: 78 %
Platelets: 196 10*3/uL (ref 150–400)
RBC: 3.24 MIL/uL — ABNORMAL LOW (ref 4.22–5.81)
RDW: 14.5 % (ref 11.5–15.5)
WBC: 7.5 10*3/uL (ref 4.0–10.5)
nRBC: 0 % (ref 0.0–0.2)

## 2023-07-05 LAB — BASIC METABOLIC PANEL
Anion gap: 16 — ABNORMAL HIGH (ref 5–15)
BUN: 47 mg/dL — ABNORMAL HIGH (ref 8–23)
CO2: 28 mmol/L (ref 22–32)
Calcium: 9.4 mg/dL (ref 8.9–10.3)
Chloride: 95 mmol/L — ABNORMAL LOW (ref 98–111)
Creatinine, Ser: 8.99 mg/dL — ABNORMAL HIGH (ref 0.61–1.24)
GFR, Estimated: 6 mL/min — ABNORMAL LOW (ref >60)
Glucose, Bld: 96 mg/dL (ref 70–99)
Potassium: 4.4 mmol/L (ref 3.5–5.1)
Sodium: 139 mmol/L (ref 135–145)

## 2023-07-05 LAB — TROPONIN I (HIGH SENSITIVITY): Troponin I (High Sensitivity): 16 ng/L (ref <18)

## 2023-07-05 MED ORDER — IOHEXOL 350 MG/ML SOLN
75.0000 mL | Freq: Once | INTRAVENOUS | Status: AC | PRN
  Administered 2023-07-05: 75 mL via INTRAVENOUS

## 2023-07-05 NOTE — ED Provider Notes (Signed)
[facility] Provider Note    Event Date/Time   First MD Initiated Contact with Patient 07/05/23 1949     (approximate)   History   Chief Complaint Chest Pain   HPI  Todd Brown is a 63 y.o. male with past medical history of hypertension, hyperlipidemia, CAD, COPD, chronic hypoxic respiratory failure on 2 L, ESRD on HD (MWF), and schizophrenia who presents to the ED complaining of chest pain.  Per EMS, patient is independent living facility called out due to chest pain and difficulty breathing.  Patient repeatedly saying to EMS during transport that "I need my dialysis treatment."  He apparently missed his treatment earlier today due to a transportation issue, last had dialysis 3 days ago.  Patient endorses some difficulty breathing and sharp pain in his chest that is typical when he misses dialysis.  He denies any fevers or cough.  EMS reported that patient does not wear oxygen at baseline, found to be hypoxic to the mid 80s on room air.      Physical Exam   Triage Vital Signs: ED Triage Vitals  Encounter Vitals Group     BP      Systolic BP Percentile      Diastolic BP Percentile      Pulse      Resp      Temp      Temp src      SpO2      Weight      Height      Head Circumference      Peak Flow      Pain Score      Pain Loc      Pain Education      Exclude from Growth Chart     Most recent vital signs: Vitals:   07/05/23 1955 07/05/23 2010  BP: (!) 145/92 115/88  Pulse: 79 74  Resp: 19 13  SpO2: 95% 93%    Constitutional: Alert and oriented. Eyes: Conjunctivae are normal. Head: Atraumatic. Nose: No congestion/rhinnorhea. Mouth/Throat: Mucous membranes are moist.  Cardiovascular: Normal rate, regular rhythm. Grossly normal heart sounds.  2+ radial pulses bilaterally.  Left upper extremity AV fistula with palpable thrill. Respiratory: Normal respiratory effort.  No retractions. Lungs CTAB. Gastrointestinal: Soft and nontender.  No distention. Musculoskeletal: No lower extremity tenderness nor edema.  Neurologic:  Normal speech and language. No gross focal neurologic deficits are appreciated.    ED Results / Procedures / Treatments   Labs (all labs ordered are listed, but only abnormal results are displayed) Labs Reviewed  CBC WITH DIFFERENTIAL/PLATELET - Abnormal; Notable for the following components:      Result Value   RBC 3.24 (*)    Hemoglobin 11.4 (*)    HCT 33.9 (*)    MCV 104.6 (*)    MCH 35.2 (*)    All other components within normal limits  BASIC METABOLIC PANEL - Abnormal; Notable for the following components:   Chloride 95 (*)    BUN 47 (*)    Creatinine, Ser 8.99 (*)    GFR, Estimated 6 (*)    Anion gap 16 (*)    All other components within normal limits  TROPONIN I (HIGH SENSITIVITY)  TROPONIN I (HIGH SENSITIVITY)    RADIOLOGY Chest x-ray reviewed and interpreted by me with left lung base opacity, no edema or effusion noted.  PROCEDURES:  Critical Care performed: No  Procedures   MEDICATIONS ORDERED IN ED: Medications - No  data to display   IMPRESSION / MDM / ASSESSMENT AND PLAN / ED COURSE  I reviewed the triage vital signs and the nursing notes.                              63 y.o. male with past medical history of hypertension, hyperlipidemia, CAD, COPD on 2 L, ESRD on HD, and schizophrenia who presents to the ED with chest pain and shortness of breath after missing his dialysis treatment earlier today.  Patient's presentation is most consistent with acute presentation with potential threat to life or bodily function.  Differential diagnosis includes, but is not limited to, ACS, PE, pneumonia, pneumothorax, musculoskeletal pain, electrolyte abnormality, anemia.  Patient nontoxic-appearing and in no acute distress, vital signs remarkable for oxygen saturations of 88% on room air, now improved on 2 L nasal cannula.  Per chart, he does seem to wear 2 L chronically,  although was reported to wear it as needed by EMS.  EKG, labs, and chest x-ray are pending at this time.  Labs consistent with known ESRD with no significant electrolyte abnormality, troponin within normal limits but will check second set.  No significant anemia or leukocytosis noted, chest x-ray does show hilar prominence as well as possible left lower lobe infiltrate.  We will further assess with CTA of his chest, patient turned over to oncoming provider pending results.      FINAL CLINICAL IMPRESSION(S) / ED DIAGNOSES   Final diagnoses:  Nonspecific chest pain  Shortness of breath     Rx / DC Orders   ED Discharge Orders     None        Note:  This document was prepared using Dragon voice recognition software and may include unintentional dictation errors.   Chesley Noon, MD 07/05/23 2308

## 2023-07-05 NOTE — ED Triage Notes (Signed)
Pt from [facility], called out for chest pain and needing dialysis treatment. Pt lung sound clear but diminished on right side, coughing productive green mucus. No fever. Pt refused ASA.  Transport by EMS Rebecca co

## 2023-07-05 NOTE — ED Provider Notes (Signed)
11:30 PM  Assumed care at shift change.  Patient here with chest pain.  Chest x-ray concerning for mass versus pneumonia.  Also missed dialysis today.  Hypoxic on room air but does wear oxygen chronically.  CT of the chest pending.  Likely will need admission.   12:30 AM CT reviewed and interpreted by myself and the radiologist and shows extensive consolidation of the right upper and lower lobe consistent with pneumonia.  Will discuss with hospitalist for admission.  Will give vancomycin and cefepime for broad coverage for HCAP given patient is on dialysis.  Cultures pending.  Patient will also need nephrology consult not emergently due to his missed dialysis.  Consulted and discussed patient's case with hospitalist, Dr. Para March.  I have recommended admission and consulting physician agrees and will place admission orders.  Patient (and family if present) agree with this plan.   I reviewed all nursing notes, vitals, pertinent previous records.  All labs, EKGs, imaging ordered have been independently reviewed and interpreted by myself.   1:00 AM  Went into patient room to update and reevaluate.  Alarm was going off and patient's oxygen saturation was 76%.  It appears he had removed his nasal cannula.  Was placed back on 2 L with no significant improvement.  Oxygen was then increased to 6 L which slowly brought his levels up to the upper 90s.  Decrease down to 4 L nasal cannula and patient was sat upright in the bed.  Sats 92%.  No respiratory distress.  Will continue to monitor.    CRITICAL CARE Performed by: Rochele Raring   Total critical care time: 35 minutes  Critical care time was exclusive of separately billable procedures and treating other patients.  Critical care was necessary to treat or prevent imminent or life-threatening deterioration.  Critical care was time spent personally by me on the following activities: development of treatment plan with patient and/or surrogate as well as  nursing, discussions with consultants, evaluation of patient's response to treatment, examination of patient, obtaining history from patient or surrogate, ordering and performing treatments and interventions, ordering and review of laboratory studies, ordering and review of radiographic studies, pulse oximetry and re-evaluation of patient's condition.    Kabe Mckoy, Layla Maw, DO 07/06/23 0139

## 2023-07-06 DIAGNOSIS — Z79899 Other long term (current) drug therapy: Secondary | ICD-10-CM | POA: Diagnosis not present

## 2023-07-06 DIAGNOSIS — Z86718 Personal history of other venous thrombosis and embolism: Secondary | ICD-10-CM | POA: Diagnosis not present

## 2023-07-06 DIAGNOSIS — Z992 Dependence on renal dialysis: Secondary | ICD-10-CM | POA: Diagnosis not present

## 2023-07-06 DIAGNOSIS — R079 Chest pain, unspecified: Secondary | ICD-10-CM | POA: Diagnosis present

## 2023-07-06 DIAGNOSIS — R41841 Cognitive communication deficit: Secondary | ICD-10-CM | POA: Diagnosis present

## 2023-07-06 DIAGNOSIS — Z1152 Encounter for screening for COVID-19: Secondary | ICD-10-CM | POA: Diagnosis not present

## 2023-07-06 DIAGNOSIS — Z888 Allergy status to other drugs, medicaments and biological substances status: Secondary | ICD-10-CM | POA: Diagnosis not present

## 2023-07-06 DIAGNOSIS — Z841 Family history of disorders of kidney and ureter: Secondary | ICD-10-CM | POA: Diagnosis not present

## 2023-07-06 DIAGNOSIS — I120 Hypertensive chronic kidney disease with stage 5 chronic kidney disease or end stage renal disease: Secondary | ICD-10-CM | POA: Diagnosis present

## 2023-07-06 DIAGNOSIS — J189 Pneumonia, unspecified organism: Secondary | ICD-10-CM | POA: Diagnosis present

## 2023-07-06 DIAGNOSIS — J9601 Acute respiratory failure with hypoxia: Secondary | ICD-10-CM

## 2023-07-06 DIAGNOSIS — N186 End stage renal disease: Secondary | ICD-10-CM | POA: Diagnosis present

## 2023-07-06 DIAGNOSIS — Z91158 Patient's noncompliance with renal dialysis for other reason: Secondary | ICD-10-CM | POA: Diagnosis not present

## 2023-07-06 DIAGNOSIS — J9621 Acute and chronic respiratory failure with hypoxia: Secondary | ICD-10-CM | POA: Diagnosis present

## 2023-07-06 DIAGNOSIS — Z7951 Long term (current) use of inhaled steroids: Secondary | ICD-10-CM | POA: Diagnosis not present

## 2023-07-06 DIAGNOSIS — J440 Chronic obstructive pulmonary disease with acute lower respiratory infection: Secondary | ICD-10-CM | POA: Diagnosis present

## 2023-07-06 DIAGNOSIS — F209 Schizophrenia, unspecified: Secondary | ICD-10-CM | POA: Diagnosis present

## 2023-07-06 DIAGNOSIS — E039 Hypothyroidism, unspecified: Secondary | ICD-10-CM | POA: Diagnosis present

## 2023-07-06 DIAGNOSIS — M1990 Unspecified osteoarthritis, unspecified site: Secondary | ICD-10-CM | POA: Diagnosis present

## 2023-07-06 DIAGNOSIS — Z79890 Hormone replacement therapy (postmenopausal): Secondary | ICD-10-CM | POA: Diagnosis not present

## 2023-07-06 DIAGNOSIS — E785 Hyperlipidemia, unspecified: Secondary | ICD-10-CM | POA: Diagnosis present

## 2023-07-06 DIAGNOSIS — I2510 Atherosclerotic heart disease of native coronary artery without angina pectoris: Secondary | ICD-10-CM | POA: Diagnosis present

## 2023-07-06 LAB — RENAL FUNCTION PANEL
Albumin: 3.1 g/dL — ABNORMAL LOW (ref 3.5–5.0)
Anion gap: 18 — ABNORMAL HIGH (ref 5–15)
BUN: 54 mg/dL — ABNORMAL HIGH (ref 8–23)
CO2: 23 mmol/L (ref 22–32)
Calcium: 9.4 mg/dL (ref 8.9–10.3)
Chloride: 93 mmol/L — ABNORMAL LOW (ref 98–111)
Creatinine, Ser: 9.76 mg/dL — ABNORMAL HIGH (ref 0.61–1.24)
GFR, Estimated: 6 mL/min — ABNORMAL LOW (ref >60)
Glucose, Bld: 103 mg/dL — ABNORMAL HIGH (ref 70–99)
Phosphorus: 2.8 mg/dL (ref 2.5–4.6)
Potassium: 4.3 mmol/L (ref 3.5–5.1)
Sodium: 134 mmol/L — ABNORMAL LOW (ref 135–145)

## 2023-07-06 LAB — CBC
HCT: 29.7 % — ABNORMAL LOW (ref 39.0–52.0)
Hemoglobin: 10.2 g/dL — ABNORMAL LOW (ref 13.0–17.0)
MCH: 34.7 pg — ABNORMAL HIGH (ref 26.0–34.0)
MCHC: 34.3 g/dL (ref 30.0–36.0)
MCV: 101 fL — ABNORMAL HIGH (ref 80.0–100.0)
Platelets: 221 10*3/uL (ref 150–400)
RBC: 2.94 MIL/uL — ABNORMAL LOW (ref 4.22–5.81)
RDW: 14.5 % (ref 11.5–15.5)
WBC: 6 10*3/uL (ref 4.0–10.5)
nRBC: 0 % (ref 0.0–0.2)

## 2023-07-06 LAB — TROPONIN I (HIGH SENSITIVITY): Troponin I (High Sensitivity): 17 ng/L (ref <18)

## 2023-07-06 LAB — MRSA NEXT GEN BY PCR, NASAL: MRSA by PCR Next Gen: NOT DETECTED

## 2023-07-06 LAB — RESP PANEL BY RT-PCR (RSV, FLU A&B, COVID)  RVPGX2
Influenza A by PCR: NEGATIVE
Influenza B by PCR: NEGATIVE
Resp Syncytial Virus by PCR: NEGATIVE
SARS Coronavirus 2 by RT PCR: NEGATIVE

## 2023-07-06 LAB — PROCALCITONIN: Procalcitonin: 0.81 ng/mL

## 2023-07-06 MED ORDER — ACETAMINOPHEN 325 MG PO TABS: 650.0000 mg | ORAL_TABLET | Freq: Four times a day (QID) | ORAL | Status: DC | PRN

## 2023-07-06 MED ORDER — IPRATROPIUM-ALBUTEROL 0.5-2.5 (3) MG/3ML IN SOLN
3.0000 mL | Freq: Four times a day (QID) | RESPIRATORY_TRACT | Status: DC | PRN
  Administered 2023-07-06: 3 mL via RESPIRATORY_TRACT
  Filled 2023-07-06: qty 3

## 2023-07-06 MED ORDER — HYOSCYAMINE SULFATE 0.125 MG SL SUBL: 0.1250 mg | SUBLINGUAL_TABLET | SUBLINGUAL | Status: DC | PRN

## 2023-07-06 MED ORDER — SODIUM CHLORIDE 0.9 % IV SOLN
500.0000 mg | Freq: Every day | INTRAVENOUS | Status: DC
  Administered 2023-07-06 – 2023-07-07 (×2): 500 mg via INTRAVENOUS
  Filled 2023-07-06 (×2): qty 5

## 2023-07-06 MED ORDER — ONDANSETRON HCL 4 MG PO TABS: 4.0000 mg | ORAL_TABLET | Freq: Four times a day (QID) | ORAL | Status: DC | PRN

## 2023-07-06 MED ORDER — MOMETASONE FURO-FORMOTEROL FUM 200-5 MCG/ACT IN AERO
2.0000 | INHALATION_SPRAY | Freq: Two times a day (BID) | RESPIRATORY_TRACT | Status: DC
  Administered 2023-07-06 – 2023-07-07 (×3): 2 via RESPIRATORY_TRACT
  Filled 2023-07-06 (×3): qty 8.8

## 2023-07-06 MED ORDER — CHLORHEXIDINE GLUCONATE CLOTH 2 % EX PADS
6.0000 | MEDICATED_PAD | Freq: Every day | CUTANEOUS | Status: DC
  Filled 2023-07-06 (×2): qty 6

## 2023-07-06 MED ORDER — ROSUVASTATIN CALCIUM 10 MG PO TABS
10.0000 mg | ORAL_TABLET | Freq: Every day | ORAL | Status: DC
  Filled 2023-07-06: qty 1

## 2023-07-06 MED ORDER — HYDRALAZINE HCL 50 MG PO TABS
100.0000 mg | ORAL_TABLET | Freq: Three times a day (TID) | ORAL | Status: DC
  Administered 2023-07-06 – 2023-07-07 (×3): 100 mg via ORAL
  Filled 2023-07-06 (×3): qty 2

## 2023-07-06 MED ORDER — LEVOTHYROXINE SODIUM 50 MCG PO TABS
125.0000 ug | ORAL_TABLET | Freq: Every day | ORAL | Status: DC
Start: 2023-07-06 — End: 2023-07-08
  Administered 2023-07-06 – 2023-07-07 (×2): 125 ug via ORAL
  Filled 2023-07-06: qty 3
  Filled 2023-07-06: qty 1

## 2023-07-06 MED ORDER — CALCIUM ACETATE (PHOS BINDER) 667 MG PO CAPS
1334.0000 mg | ORAL_CAPSULE | Freq: Three times a day (TID) | ORAL | Status: DC
  Administered 2023-07-06 – 2023-07-07 (×5): 1334 mg via ORAL
  Filled 2023-07-06 (×7): qty 2

## 2023-07-06 MED ORDER — VANCOMYCIN HCL 1500 MG/300ML IV SOLN
1500.0000 mg | Freq: Once | INTRAVENOUS | Status: AC
  Administered 2023-07-06: 1500 mg via INTRAVENOUS
  Filled 2023-07-06: qty 300

## 2023-07-06 MED ORDER — GUAIFENESIN-DM 100-10 MG/5ML PO SYRP
5.0000 mL | ORAL_SOLUTION | ORAL | Status: DC | PRN
  Administered 2023-07-06: 5 mL via ORAL
  Filled 2023-07-06: qty 10

## 2023-07-06 MED ORDER — SODIUM CHLORIDE 0.9 % IV SOLN
2.0000 g | Freq: Once | INTRAVENOUS | Status: AC
Start: 2023-07-06 — End: 2023-07-06
  Administered 2023-07-06: 2 g via INTRAVENOUS
  Filled 2023-07-06: qty 12.5

## 2023-07-06 MED ORDER — ONDANSETRON HCL 4 MG/2ML IJ SOLN: 4.0000 mg | Freq: Four times a day (QID) | INTRAMUSCULAR | Status: DC | PRN

## 2023-07-06 MED ORDER — SODIUM CHLORIDE 0.9 % IV SOLN
2.0000 g | INTRAVENOUS | Status: DC
  Administered 2023-07-06: 2 g via INTRAVENOUS
  Filled 2023-07-06: qty 20

## 2023-07-06 MED ORDER — PANTOPRAZOLE SODIUM 40 MG PO TBEC
40.0000 mg | DELAYED_RELEASE_TABLET | Freq: Every day | ORAL | Status: DC
  Administered 2023-07-06 – 2023-07-07 (×2): 40 mg via ORAL
  Filled 2023-07-06 (×2): qty 1

## 2023-07-06 MED ORDER — ACETAMINOPHEN 325 MG RE SUPP: 650.0000 mg | Freq: Four times a day (QID) | RECTAL | Status: DC | PRN

## 2023-07-06 MED ORDER — HEPARIN SODIUM (PORCINE) 5000 UNIT/ML IJ SOLN
5000.0000 [IU] | Freq: Three times a day (TID) | INTRAMUSCULAR | Status: DC
  Administered 2023-07-06 – 2023-07-07 (×4): 5000 [IU] via SUBCUTANEOUS
  Filled 2023-07-06 (×4): qty 1

## 2023-07-06 MED ORDER — ISOSORBIDE MONONITRATE ER 60 MG PO TB24
60.0000 mg | ORAL_TABLET | Freq: Every day | ORAL | Status: DC
  Administered 2023-07-06 – 2023-07-07 (×2): 60 mg via ORAL
  Filled 2023-07-06 (×2): qty 1

## 2023-07-06 NOTE — Hospital Course (Addendum)
 Taken from H&P.   Todd Brown is a 63 y.o. male with medical history significant for  hypertension, hyperlipidemia, CAD, COPD, chronic hypoxic respiratory failure on 2 L, ESRD on HD (MWF), and schizophrenia being admitted for pneumonia after resenting to the ED complaining of chest pain and shortness of breath in the setting of missing his dialysis session earlier in the day.   EMS found him to have sats in the mid 80s on room air.  Patient was recently admitted from 1/15 to 06/20/2023 with influenza A.  He had a palliative care consult during that visit. ED course: BP 139/100, O2 sats 89% on room air improving to 93% on 2 L. Workup notable for negative respiratory viral panel, normal WBC, procalcitonin 0.81.   CTA chest PE protocol negative for PE but showing large area of consolidation right upper and lower lobe consistent with pneumonia recommending follow-up imaging to rule out neoplasm   Patient received a dose of cefepime  and vancomycin , later started on ceftriaxone  and Zithromax .  2/4: Afebrile with stable vital, MRSA PCR negative.  Preliminary blood cultures negative.  Patient is now back to his baseline oxygen requirement.  Appears to have significant cognitive impairment and unable to communicate well, just yell and shout.  Lives in assisted living facility, DSS is guardian.  2/5: Patient remained hemodynamically stable.  Blood cultures remain negative.  Saturating well on baseline oxygen requirement.  Received dialysis yesterday and his routine today.  Patient is being discharged on Zithromax  and cefuroxime  to complete the course for pneumonia.  He will continue the rest of his home medications and routine dialysis and follow-up with his providers for further recommendations.

## 2023-07-06 NOTE — ED Notes (Signed)
Pt A+Ox3, disoriented to time, calm and cooperative at this moment and able to follow commands. Bed alarm on, nonslip socks placed, and fall risk bracelet placed.

## 2023-07-06 NOTE — ED Notes (Signed)
Pt brief was changed, pt in new gown, new warm blankets were distributed, pt states he is comfortable

## 2023-07-06 NOTE — Hospital Course (Signed)
 Todd Brown

## 2023-07-06 NOTE — ED Notes (Signed)
Pt states "I have to take a BM", placed on bedpan.

## 2023-07-06 NOTE — ED Notes (Signed)
Pt asked if wants something to eat, pt states "I'm not hungry".

## 2023-07-06 NOTE — Progress Notes (Signed)
 No charge progress note.  Todd Brown is a 63 y.o. male with medical history significant for  hypertension, hyperlipidemia, CAD, COPD, chronic hypoxic respiratory failure on 2 L, ESRD on HD (MWF), and schizophrenia being admitted for pneumonia after resenting to the ED complaining of chest pain and shortness of breath in the setting of missing his dialysis session earlier in the day.   EMS found him to have sats in the mid 80s on room air.  Patient was recently admitted from 1/15 to 06/20/2023 with influenza A.  He had a palliative care consult during that visit. ED course: BP 139/100, O2 sats 89% on room air improving to 93% on 2 L. Workup notable for negative respiratory viral panel, normal WBC, procalcitonin 0.81.   CTA chest PE protocol negative for PE but showing large area of consolidation right upper and lower lobe consistent with pneumonia recommending follow-up imaging to rule out neoplasm   Patient received a dose of cefepime  and vancomycin , later started on ceftriaxone  and Zithromax .  2/4: Afebrile with stable vital, MRSA PCR negative.  Preliminary blood cultures negative.  Patient is now back to his baseline oxygen requirement.  Appears to have significant cognitive impairment and unable to communicate well, just yell and shout.  His rest of the exam was benign.   Lives in assisted living facility, DSS is guardian. Likely can go back to his facility on p.o. antibiotics tomorrow.  -Continuing ceftriaxone  and Zithromax  today. -Likely will get his dialysis

## 2023-07-06 NOTE — ED Notes (Signed)
Pt has moderate BM, incontinence care provided. Given ginger ale per request. Repositioned in bed and given blankets per request.

## 2023-07-06 NOTE — TOC CM/SW Note (Addendum)
 CSW received notification from attending that pt would be able to d/c tomorrow. CSW attempted to contact Mr. Rosana, no answer.   CSW attempted Mr. Rosana again. Mr. Rosana reports pt can return tomorrow when he is medically ready for d/c to [address], [place], [place]. Mr. Rosana request non-emergency transport.

## 2023-07-06 NOTE — Progress Notes (Signed)
   07/06/23 0931  Readmission Prevention Plan - High Risk  Transportation Screening Complete  PCP or Specialist appointment within 5-7 days of discharge Complete (Nurse secretary to schedule at time of d/c)  High Risk Social Work Consult for recovery care planning/counseling (includes patient and caregiver) Complete  High Risk Palliative Care Screening Complete (Completed on last admission)  Medication Review Complete   Data taken from prior HRRA since pt was discharged and re-admitted within 30 days.   Admitted for: Chest Pain Admitted from: [facility] (Mr. Clemment's homes) PCP: facility PCP Pharmacy: facility pharmacy  Current home health/prior home health/DME: RW

## 2023-07-06 NOTE — TOC Progression Note (Signed)
 Transition of Care [facility]) - Progression Note    Patient Details  Name: Todd Brown MRN: [id number] Date of Birth: 02/16/1961  Transition of Care [facility]) CM/SW Contact  Silvano Molt, [place] Phone Number: 07/06/2023, 9:35 AM  Clinical Narrative:     CSW left a voicemail for ELISE Pellet, [phone] to inform her pt is back in the ED due to reports of chest pain.         Expected Discharge Plan and Services                                               Social Determinants of Health (SDOH) Interventions SDOH Screenings   Food Insecurity: No Food Insecurity (06/18/2023)  Housing: Unknown (06/18/2023)  Transportation Needs: Patient Unable To Answer (06/18/2023)  Utilities: Patient Unable To Answer (06/18/2023)  Alcohol Screen: Low Risk  (10/05/2019)  Financial Resource Strain: Low Risk  (10/08/2018)  Physical Activity: Unknown (10/08/2018)  Social Connections: Unknown (10/08/2018)  Stress: No Stress Concern Present (10/08/2018)  Tobacco Use: Low Risk  (06/16/2023)    Readmission Risk Interventions    07/06/2023    9:31 AM 06/17/2023    2:42 PM 11/05/2022    4:39 PM  Readmission Risk Prevention Plan  Transportation Screening Complete Complete Complete  PCP or Specialist Appt within 3-5 Days Complete    Social Work Consult for Recovery Care Planning/Counseling Complete    Palliative Care Screening Complete    Medication Review Oceanographer) Complete Complete Complete  PCP or Specialist appointment within 3-5 days of discharge  Complete   HRI or Home Care Consult   Complete  SW Recovery Care/Counseling Consult  Complete Complete  Palliative Care Screening  Not Applicable   Skilled Nursing Facility  Not Applicable Not Applicable

## 2023-07-06 NOTE — Progress Notes (Signed)
 [facility]  ROUNDING NOTE   Subjective:   Todd Brown is a 63 year old male with past medical conditions including COPD, hypertension, DVT, schizophrenia, and end-stage renal disease on hemodialysis.  Patient presents to the emergency department with chest pain and cough.  Patient has been admitted for Shortness of breath [R06.02] Acute respiratory failure with hypoxia (HCC) [J96.01] ESRD needing dialysis (HCC) [N18.6, Z99.2] Nonspecific chest pain [R07.9] Sepsis due to pneumonia (HCC) [J18.9, A41.9] Community acquired pneumonia of right lung, unspecified part of lung [J18.9]  Patient is known to our practice and receives outpatient dialysis treatments at [facility] on a MWF schedule, supervised by Dr. Marcelino.  Last treatment received on Friday.  Patient is a poor historian, chart review used to gather history.  It appears patient having productive cough and shortness of breath at his assisted living facility.  Unclear as to why he missed treatment on Monday.  Labs on ED arrival unremarkable for renal patient.  Respiratory panel negative for influenza, COVID-19, and RSV.  Chest x-ray shows a right hilar opacity.  CT angio chest shows a large area of consolidation consistent with pneumonia in the right upper and lower lobes.  Also shows small left pleural effusion and large volume abdominal ascites.  We have been consulted to manage dialysis needs.   Objective:  Vital signs in last 24 hours:  Temp:  [98 F (36.7 C)-98.5 F (36.9 C)] 98.5 F (36.9 C) (02/04 1420) Pulse Rate:  [62-99] 72 (02/04 1500) Resp:  [11-22] 16 (02/04 1500) BP: (113-211)/(73-111) 136/91 (02/04 1500) SpO2:  [88 %-100 %] 98 % (02/04 1500)  Weight change:  There were no vitals filed for this visit.  Intake/Output: No intake/output data recorded.   Intake/Output this shift:  No intake/output data recorded.  Physical Exam: General: NAD  Head: Normocephalic, atraumatic. Moist oral  mucosal membranes  Eyes: Anicteric  Lungs:  Clear to auscultation, NCO 2, productive cough  Heart: Regular rate and rhythm  Abdomen:  Soft, nontender, distended  Extremities: Trace peripheral edema.  Neurologic: Alert, moving all four extremities  Skin: No lesions  Access: Left aVF    Basic Metabolic Panel: Recent Labs  Lab 07/05/23 2007 07/06/23 1448  NA 139 134*  K 4.4 4.3  CL 95* 93*  CO2 28 23  GLUCOSE 96 103*  BUN 47* 54*  CREATININE 8.99* 9.76*  CALCIUM  9.4 9.4  PHOS  --  2.8    Liver Function Tests: Recent Labs  Lab 07/06/23 1448  ALBUMIN  3.1*   No results for input(s): LIPASE, AMYLASE in the last 168 hours. No results for input(s): AMMONIA in the last 168 hours.  CBC: Recent Labs  Lab 07/05/23 2007 07/06/23 1448  WBC 7.5 6.0  NEUTROABS 5.8  --   HGB 11.4* 10.2*  HCT 33.9* 29.7*  MCV 104.6* 101.0*  PLT 196 221    Cardiac Enzymes: No results for input(s): CKTOTAL, CKMB, CKMBINDEX, TROPONINI in the last 168 hours.  BNP: Invalid input(s): POCBNP  CBG: No results for input(s): GLUCAP in the last 168 hours.   Microbiology: Results for orders placed or performed during the hospital encounter of 07/05/23  Resp panel by RT-PCR (RSV, Flu A&B, Covid) Anterior Nasal Swab     Status: None   Collection Time: 07/06/23 12:11 AM   Specimen: Anterior Nasal Swab  Result Value Ref Range Status   SARS Coronavirus 2 by RT PCR NEGATIVE NEGATIVE Final    Comment: (NOTE) SARS-CoV-2 target nucleic acids are NOT DETECTED.  The SARS-CoV-2 RNA is generally detectable in upper respiratory specimens during the acute phase of infection. The lowest concentration of SARS-CoV-2 viral copies this assay can detect is 138 copies/mL. A negative result does not preclude SARS-Cov-2 infection and should not be used as the sole basis for treatment or other patient management decisions. A negative result may occur with  improper specimen collection/handling,  submission of specimen other than nasopharyngeal swab, presence of viral mutation(s) within the areas targeted by this assay, and inadequate number of viral copies(<138 copies/mL). A negative result must be combined with clinical observations, patient history, and epidemiological information. The expected result is Negative.  Fact Sheet for Patients:  bloggercourse.com  Fact Sheet for Healthcare Providers:  seriousbroker.it  This test is no t yet approved or cleared by the [place]  FDA and  has been authorized for detection and/or diagnosis of SARS-CoV-2 by FDA under an Emergency Use Authorization (EUA). This EUA will remain  in effect (meaning this test can be used) for the duration of the COVID-19 declaration under Section 564(b)(1) of the Act, 21 U.S.C.section 360bbb-3(b)(1), unless the authorization is terminated  or revoked sooner.       Influenza A by PCR NEGATIVE NEGATIVE Final   Influenza B by PCR NEGATIVE NEGATIVE Final    Comment: (NOTE) The Xpert Xpress SARS-CoV-2/FLU/RSV plus assay is intended as an aid in the diagnosis of influenza from Nasopharyngeal swab specimens and should not be used as a sole basis for treatment. Nasal washings and aspirates are unacceptable for Xpert Xpress SARS-CoV-2/FLU/RSV testing.  Fact Sheet for Patients: bloggercourse.com  Fact Sheet for Healthcare Providers: seriousbroker.it  This test is not yet approved or cleared by the [place]  FDA and has been authorized for detection and/or diagnosis of SARS-CoV-2 by FDA under an Emergency Use Authorization (EUA). This EUA will remain in effect (meaning this test can be used) for the duration of the COVID-19 declaration under Section 564(b)(1) of the Act, 21 U.S.C. section 360bbb-3(b)(1), unless the authorization is terminated or revoked.     Resp Syncytial Virus by PCR NEGATIVE  NEGATIVE Final    Comment: (NOTE) Fact Sheet for Patients: bloggercourse.com  Fact Sheet for Healthcare Providers: seriousbroker.it  This test is not yet approved or cleared by the [place]  FDA and has been authorized for detection and/or diagnosis of SARS-CoV-2 by FDA under an Emergency Use Authorization (EUA). This EUA will remain in effect (meaning this test can be used) for the duration of the COVID-19 declaration under Section 564(b)(1) of the Act, 21 U.S.C. section 360bbb-3(b)(1), unless the authorization is terminated or revoked.  Performed at [facility], [address] [place], [place] [address]   Blood culture (routine x 2)     Status: None (Preliminary result)   Collection Time: 07/06/23 12:22 AM   Specimen: BLOOD  Result Value Ref Range Status   Specimen Description BLOOD BLOOD RIGHT HAND  Final   Special Requests   Final    BOTTLES DRAWN AEROBIC AND ANAEROBIC Blood Culture results may not be optimal due to an inadequate volume of blood received in culture bottles   Culture   Final    NO GROWTH < 12 HOURS Performed at [facility], [address] [place], [place] [address]    Report Status PENDING  Incomplete  Blood culture (routine x 2)     Status: None (Preliminary result)   Collection Time: 07/06/23 12:23 AM   Specimen: BLOOD  Result Value Ref Range Status   Specimen  Description BLOOD RIGHT FOREARM  Final   Special Requests   Final    BOTTLES DRAWN AEROBIC AND ANAEROBIC Blood Culture results may not be optimal due to an inadequate volume of blood received in culture bottles   Culture   Final    NO GROWTH < 12 HOURS Performed at [facility], [address] [place], [place] [address]    Report Status PENDING  Incomplete  MRSA Next Gen by PCR, Nasal     Status: None   Collection Time: 07/06/23  4:00 AM   Specimen: Nasal Mucosa; Nasal Swab  Result Value Ref Range Status    MRSA by PCR Next Gen NOT DETECTED NOT DETECTED Final    Comment: (NOTE) The GeneXpert MRSA Assay (FDA approved for NASAL specimens only), is one component of a comprehensive MRSA colonization surveillance program. It is not intended to diagnose MRSA infection nor to guide or monitor treatment for MRSA infections. Test performance is not FDA approved in patients less than 77 years old. Performed at [facility], [address] [place], [place] [address]     Coagulation Studies: No results for input(s): LABPROT, INR in the last 72 hours.  Urinalysis: No results for input(s): COLORURINE, LABSPEC, PHURINE, GLUCOSEU, HGBUR, BILIRUBINUR, KETONESUR, PROTEINUR, UROBILINOGEN, NITRITE, LEUKOCYTESUR in the last 72 hours.  Invalid input(s): APPERANCEUR    Imaging: CT Angio Chest PE W/Cm &/Or Wo Cm Result Date: 07/05/2023 CLINICAL DATA:  Cough with decreased lung sounds EXAM: CT ANGIOGRAPHY CHEST WITH CONTRAST TECHNIQUE: Multidetector CT imaging of the chest was performed using the standard protocol during bolus administration of intravenous contrast. Multiplanar CT image reconstructions and MIPs were obtained to evaluate the vascular anatomy. RADIATION DOSE REDUCTION: This exam was performed according to the departmental dose-optimization program which includes automated exposure control, adjustment of the mA and/or kV according to patient size and/or use of iterative reconstruction technique. CONTRAST:  75mL OMNIPAQUE  IOHEXOL  350 MG/ML SOLN COMPARISON:  11/03/2022 FINDINGS: Cardiovascular: Contrast injection is sufficient to demonstrate satisfactory opacification of the pulmonary arteries to the segmental level. There is no pulmonary embolus or evidence of right heart strain. The size of the main pulmonary artery is normal. Moderate cardiomegaly. No pericardial effusion. The course and caliber of the aorta are normal. There is no atherosclerotic calcification.  Opacification decreased due to pulmonary arterial phase contrast bolus timing. Mediastinum/Nodes: No mediastinal, hilar or axillary lymphadenopathy. Normal visualized thyroid . Thoracic esophageal course is normal. Lungs/Pleura: There is a large area of consolidation of the right upper and lower lobe. Smaller left lower lobe consolidation. Small left pleural effusion. Upper Abdomen: Contrast bolus timing is not optimized for evaluation of the abdominal organs. Large volume upper abdominal ascites. Thickened appearance of both adrenal glands. Bilateral renal atrophy. Musculoskeletal: No chest wall abnormality. No bony spinal canal stenosis. Other: Bilateral gynecomastia. Review of the MIP images confirms the above findings. IMPRESSION: 1. No pulmonary embolus or evidence of right heart strain. 2. Large area of consolidation of the right upper and lower lobe, consistent with pneumonia. Follow-up imaging recommended after treatment to ensure resolution and exclude superimposed neoplasm. 3. Small left pleural effusion. 4. Large volume upper abdominal ascites. Aortic Atherosclerosis (ICD10-I70.0). Electronically Signed   By: Franky Stanford M.D.   On: 07/05/2023 23:36   DG Chest Portable 1 View Result Date: 07/05/2023 CLINICAL DATA:  Chest pain EXAM: PORTABLE CHEST 1 VIEW COMPARISON:  06/16/2023, 06/19/2022, chest CT 09/14/2022 FINDINGS: Stable enlarged cardiomediastinal silhouette. Heterogeneous left lung base opacity. Increasing right hilar asymmetric  density and opacity. No pleural effusion or pneumothorax IMPRESSION: 1. Increasing right hilar asymmetric density and opacity, mass lesion is a concern. Recommend correlation with contrast-enhanced chest CT. 2. Heterogeneous left lung base opacity, atelectasis versus pneumonia. Electronically Signed   By: Luke Bun M.D.   On: 07/05/2023 20:59     Medications:    azithromycin  Stopped (07/06/23 0427)   cefTRIAXone  (ROCEPHIN )  IV      calcium  acetate  1,334 mg  Oral TID WC   Chlorhexidine  Gluconate Cloth  6 each Topical Q0600   heparin   5,000 Units Subcutaneous Q8H   hydrALAZINE   100 mg Oral Q8H   isosorbide  mononitrate  60 mg Oral Daily   levothyroxine   125 mcg Oral Q0600   mometasone -formoterol   2 puff Inhalation BID   pantoprazole   40 mg Oral Daily   [START ON 07/07/2023] rosuvastatin   10 mg Oral QHS   acetaminophen  **OR** acetaminophen , guaiFENesin -dextromethorphan , hyoscyamine , ipratropium-albuterol , ondansetron  **OR** ondansetron  (ZOFRAN ) IV  Assessment/ Plan:  [name] is a 63 y.o.  male  with past medical conditions including COPD, hypertension, DVT, schizophrenia, and end-stage renal disease on hemodialysis.  Patient presents to the emergency department with chest pain and cough.  Patient has been admitted for Shortness of breath [R06.02] Acute respiratory failure with hypoxia (HCC) [J96.01] ESRD needing dialysis (HCC) [N18.6, Z99.2] Nonspecific chest pain [R07.9] Sepsis due to pneumonia (HCC) [J18.9, A41.9] Community acquired pneumonia of right lung, unspecified part of lung [J18.9]  [facility]/MWF/left aVF   End-stage renal disease on hemodialysis.  Last treatment received on Friday.  Patient will receive short dialysis treatment today.  Will receive treatment tomorrow to maintain outpatient schedule.  2. Anemia of chronic kidney disease Lab Results  Component Value Date   HGB 10.2 (L) 07/06/2023    Hemoglobin satisfactory.  Patient does receive Mircera at outpatient clinic.  No need for ESA at this time during admission.  Will monitor  3. Secondary Hyperparathyroidism: with outpatient labs: PTH 356, phosphorus 4.1, calcium  8.9 on 06/14/23.   Lab Results  Component Value Date   PTH 194 (H) 05/11/2017   CALCIUM  9.4 07/06/2023   CAION 1.19 03/27/2022   PHOS 2.8 07/06/2023    Will continue to monitor bone minerals during this admission.  Currently prescribed calcium  acetate with meals.  4.  Hypertension  with chronic kidney disease.  Home regimen includes hydralazine  and isosorbide .  Both currently prescribed.  Blood pressure elevated, usually decreases with dialysis.  Will monitor.    LOS: 0 Jillaine Waren 2/4/20253:48 PM

## 2023-07-06 NOTE — H&P (Signed)
 History and Physical    Patient: Todd Brown FMW:[id number] DOB: [birth year] DOA: 07/05/2023 DOS: the patient was seen and examined on 07/06/2023 PCP: Venson Candis LITTIE, NP  Patient coming from: Home  Chief Complaint:  Chief Complaint  Patient presents with   Chest Pain    HPI: Todd Brown is a 63 y.o. male with medical history significant for  hypertension, hyperlipidemia, CAD, COPD, chronic hypoxic respiratory failure on 2 L, ESRD on HD (MWF), and schizophrenia being admitted for pneumonia after resenting to the ED complaining of chest pain and shortness of breath in the setting of missing his dialysis session earlier in the day.  He reported that he gets chest pain whenever he misses dialysis.  He denies cough or fever, lower extremity pain or swelling.  EMS found him to have sats in the mid 80s on room air.  Patient was recently admitted from 1/15 to 06/20/2023 with influenza A.  He had a palliative care consult during that visit. ED course: BP 139/100, O2 sats 89% on room air improving to 93% on 2 L. Workup notable for negative respiratory viral panel, normal WBC, procalcitonin 0.81. CBC and CMP otherwise notable for hemoglobin of 11.4.  EKG not yet doneEKG  CTA chest PE protocol negative for PE but showing large area of consolidation right upper and lower lobe consistent with pneumonia recommending follow-up imaging to rule out neoplasm  Patient started on cefepime  and vancomycin  Hospitalist consulted for admission.       Past Medical History:  Diagnosis Date   Anemia    Arthritis    Chronic respiratory failure (HCC)    COPD (chronic obstructive pulmonary disease) (HCC)    Dialysis patient (HCC)    Mon. 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   ESRD (end stage renal disease) (HCC)    ESRD (end stage renal disease) on dialysis (HCC)    Hyperlipidemia    Hypertension    Malignant hypertension    Pneumonia 2016   Renal  artery stenosis (HCC)    Schizophrenia (HCC)    Past Surgical History:  Procedure Laterality Date   A/V FISTULAGRAM N/A 08/06/2016   Procedure: A/V Fistulagram;  Surgeon: Selinda GORMAN Gu, MD;  Location: [facility];  Service: Cardiovascular;  Laterality: N/A;   A/V FISTULAGRAM N/A 02/14/2021   Procedure: A/V FISTULAGRAM poss Perm Cath Insertion;  Surgeon: Jama Cordella MATSU, MD;  Location: [facility];  Service: Cardiovascular;  Laterality: N/A;   A/V FISTULAGRAM Left 08/26/2021   Procedure: A/V Fistulagram;  Surgeon: Gu Selinda GORMAN, MD;  Location: [facility];  Service: Cardiovascular;  Laterality: Left;   A/V SHUNT INTERVENTION N/A 08/06/2016   Procedure: A/V Shunt Intervention;  Surgeon: Selinda GORMAN Gu, MD;  Location: [facility];  Service: Cardiovascular;  Laterality: N/A;   AV FISTULA PLACEMENT Left 12/26/2014   Procedure: ARTERIOVENOUS (AV) FISTULA CREATION;  Surgeon: Selinda GORMAN Gu, MD;  Location: [facility];  Service: Vascular;  Laterality: Left;   AV FISTULA PLACEMENT     ESOPHAGOGASTRODUODENOSCOPY (EGD) WITH PROPOFOL  N/A 05/06/2017   Procedure: ESOPHAGOGASTRODUODENOSCOPY (EGD) WITH PROPOFOL ;  Surgeon: Therisa Bi, MD;  Location[facility];  Service: Gastroenterology;  Laterality: N/A;   INSERTION OF DIALYSIS CATHETER Right    PERIPHERAL VASCULAR CATHETERIZATION N/A 10/22/2014   Procedure: Dialysis/Perma Catheter Insertion;  Surgeon: Selinda GORMAN Gu, MD;  Location: [facility];  Service: Cardiovascular;  Laterality:  N/A;   PERIPHERAL VASCULAR CATHETERIZATION N/A 02/28/2015   Procedure: Dialysis/Perma Catheter Removal;  Surgeon: Selinda GORMAN Gu, MD;  Location: [facility];  Service: Cardiovascular;  Laterality: N/A;   Repair fx left lower leg     yrs ago (age 18)   REVISON OF ARTERIOVENOUS FISTULA Left 03/27/2022   Procedure: REVISON OF ARTERIOVENOUS FISTULA (JUMP GRAFT);  Surgeon: Jama Cordella MATSU, MD;  Location: [facility];  Service: Vascular;   Laterality: Left;   TEE WITHOUT CARDIOVERSION N/A 09/21/2022   Procedure: TRANSESOPHAGEAL ECHOCARDIOGRAM;  Surgeon: Darliss Rogue, MD;  Location: [facility];  Service: Cardiovascular;  Laterality: N/A;   TONSILLECTOMY     Social History:  reports that he has never smoked. He has never been exposed to tobacco smoke. He has never used smokeless tobacco. He reports that he does not currently use alcohol. He reports that he does not currently use drugs after having used the following drugs: Marijuana.  Allergies  Allergen Reactions   Chlorpromazine Other (See Comments)    Reaction:  Unknown , pt states it makes him feel real bad Reaction:  Unknown , pt states it makes him feel real bad     Family History  Problem Relation Age of Onset   Kidney disease Brother    Diabetes Neg Hx     Prior to Admission medications   Medication Sig Start Date End Date Taking? Authorizing Provider  albuterol  (VENTOLIN  HFA) 108 (90 Base) MCG/ACT inhaler Inhale 1-2 puffs into the lungs every 4 (four) hours as needed for wheezing or shortness of breath.    [provider]  b complex-vitamin c -folic acid  (NEPHRO-VITE) 0.8 MG TABS tablet Take 1 tablet by mouth at bedtime.    [provider]  calcium  acetate (PHOSLO ) 667 MG capsule Take 1,334 mg by mouth 3 (three) times daily with meals. 0800/1400/2000    [provider]  carbamazepine  (TEGRETOL ) 200 MG tablet Take 1,000 mg by mouth every morning.    [provider]  cholecalciferol  (VITAMIN D ) 1000 UNITS tablet Take 1,000 Units by mouth in the morning.    [provider]  ferrous sulfate  325 (65 FE) MG tablet Take 1 tablet (325 mg total) by mouth daily. 05/19/15   Patel, Sona, MD  Fluticasone -Salmeterol (ADVAIR) 250-50 MCG/DOSE AEPB Inhale 1 puff into the lungs 2 (two) times daily.    [provider]  guaiFENesin -dextromethorphan  (ROBITUSSIN DM) 100-10 MG/5ML syrup Take 5 mLs by mouth every 4 (four) hours as  needed for cough. 05/04/23   Alexander, Natalie, DO  hydrALAZINE  (APRESOLINE ) 100 MG tablet Take 1 tablet (100 mg total) by mouth every 8 (eight) hours. 05/04/23   Alexander, Natalie, DO  hyoscyamine  (LEVSIN  SL) 0.125 MG SL tablet Place 0.125 mg under the tongue every 4 (four) hours as needed for cramping.    [provider]  ipratropium-albuterol  (DUONEB) 0.5-2.5 (3) MG/3ML SOLN Take 3 mLs by nebulization every 6 (six) hours as needed. 05/04/23   Alexander, Natalie, DO  isosorbide  mononitrate (IMDUR ) 60 MG 24 hr tablet Take 1 tablet (60 mg total) by mouth daily. 06/19/23   Maree Hue, MD  levothyroxine  (SYNTHROID ) 125 MCG tablet Take 1 tablet (125 mcg total) by mouth daily. 11/08/22 06/16/23  Josette Ade, MD  nicotine  (NICODERM CQ  - DOSED IN MG/24 HOURS) 21 mg/24hr patch Place 1 patch (21 mg total) onto the skin daily. 05/05/23   Alexander, Natalie, DO  omeprazole  (PRILOSEC) 40 MG capsule Take 40 mg by mouth every morning.  [provider]  oseltamivir  (TAMIFLU ) 30 MG capsule Take 1 capsule (30 mg total) by mouth every Monday, Wednesday, and Friday. 06/21/23   Shah, Vipul, MD  polyethylene glycol (MIRALAX  / GLYCOLAX ) 17 g packet Take 17 g by mouth daily as needed for mild constipation.    [provider]  rosuvastatin  (CRESTOR ) 10 MG tablet Take 10 mg by mouth at bedtime. 04/19/23   [provider]  trihexyphenidyl  (ARTANE ) 2 MG tablet Take 2 mg by mouth 2 (two) times daily.    [provider]  zinc  sulfate 220 (50 Zn) MG capsule Take 220 mg by mouth daily.    [provider]    Physical Exam: Vitals:   07/05/23 1950 07/05/23 1955 07/05/23 2010 07/06/23 0100  BP: (!) 139/100 (!) 145/92 115/88   Pulse: 78 79 74   Resp: 12 19 13    Temp:    98 F (36.7 C)  TempSrc:    Oral  SpO2: (!) 89% 95% 93%    Physical Exam Vitals and nursing note reviewed.  Constitutional:      General: He is not in acute distress. HENT:     Head: Normocephalic  and atraumatic.  Cardiovascular:     Rate and Rhythm: Normal rate and regular rhythm.     Heart sounds: Normal heart sounds.  Pulmonary:     Effort: Pulmonary effort is normal.     Breath sounds: Wheezing present.  Abdominal:     Palpations: Abdomen is soft.     Tenderness: There is no abdominal tenderness.  Neurological:     Mental Status: Mental status is at baseline.     Labs on Admission: I have personally reviewed following labs and imaging studies  CBC: Recent Labs  Lab 07/05/23 2007  WBC 7.5  NEUTROABS 5.8  HGB 11.4*  HCT 33.9*  MCV 104.6*  PLT 196   Basic Metabolic Panel: Recent Labs  Lab 07/05/23 2007  NA 139  K 4.4  CL 95*  CO2 28  GLUCOSE 96  BUN 47*  CREATININE 8.99*  CALCIUM  9.4   GFR: CrCl cannot be calculated (Unknown ideal weight.). Liver Function Tests: No results for input(s): AST, ALT, ALKPHOS, BILITOT, PROT, ALBUMIN  in the last 168 hours. No results for input(s): LIPASE, AMYLASE in the last 168 hours. No results for input(s): AMMONIA in the last 168 hours. Coagulation Profile: No results for input(s): INR, PROTIME in the last 168 hours. Cardiac Enzymes: No results for input(s): CKTOTAL, CKMB, CKMBINDEX, TROPONINI in the last 168 hours. BNP (last 3 results) No results for input(s): PROBNP in the last 8760 hours. HbA1C: No results for input(s): HGBA1C in the last 72 hours. CBG: No results for input(s): GLUCAP in the last 168 hours. Lipid Profile: No results for input(s): CHOL, HDL, LDLCALC, TRIG, CHOLHDL, LDLDIRECT in the last 72 hours. Thyroid  Function Tests: No results for input(s): TSH, T4TOTAL, FREET4, T3FREE, THYROIDAB in the last 72 hours. Anemia Panel: No results for input(s): VITAMINB12, FOLATE, FERRITIN, TIBC, IRON , RETICCTPCT in the last 72 hours. Urine analysis:    Component Value Date/Time   COLORURINE YELLOW (A) 05/17/2015 0012   APPEARANCEUR HAZY (A)  05/17/2015 0012   LABSPEC 1.009 05/17/2015 0012   PHURINE 7.0 05/17/2015 0012   GLUCOSEU NEGATIVE 05/17/2015 0012   HGBUR 1+ (A) 05/17/2015 0012   BILIRUBINUR NEGATIVE 05/17/2015 0012   KETONESUR NEGATIVE 05/17/2015 0012   PROTEINUR 30 (A) 05/17/2015 0012   NITRITE NEGATIVE 05/17/2015 0012   LEUKOCYTESUR 3+ (A) 05/17/2015  0012    Radiological Exams on Admission: CT Angio Chest PE W/Cm &/Or Wo Cm Result Date: 07/05/2023 CLINICAL DATA:  Cough with decreased lung sounds EXAM: CT ANGIOGRAPHY CHEST WITH CONTRAST TECHNIQUE: Multidetector CT imaging of the chest was performed using the standard protocol during bolus administration of intravenous contrast. Multiplanar CT image reconstructions and MIPs were obtained to evaluate the vascular anatomy. RADIATION DOSE REDUCTION: This exam was performed according to the departmental dose-optimization program which includes automated exposure control, adjustment of the mA and/or kV according to patient size and/or use of iterative reconstruction technique. CONTRAST:  75mL OMNIPAQUE  IOHEXOL  350 MG/ML SOLN COMPARISON:  11/03/2022 FINDINGS: Cardiovascular: Contrast injection is sufficient to demonstrate satisfactory opacification of the pulmonary arteries to the segmental level. There is no pulmonary embolus or evidence of right heart strain. The size of the main pulmonary artery is normal. Moderate cardiomegaly. No pericardial effusion. The course and caliber of the aorta are normal. There is no atherosclerotic calcification. Opacification decreased due to pulmonary arterial phase contrast bolus timing. Mediastinum/Nodes: No mediastinal, hilar or axillary lymphadenopathy. Normal visualized thyroid . Thoracic esophageal course is normal. Lungs/Pleura: There is a large area of consolidation of the right upper and lower lobe. Smaller left lower lobe consolidation. Small left pleural effusion. Upper Abdomen: Contrast bolus timing is not optimized for evaluation of the  abdominal organs. Large volume upper abdominal ascites. Thickened appearance of both adrenal glands. Bilateral renal atrophy. Musculoskeletal: No chest wall abnormality. No bony spinal canal stenosis. Other: Bilateral gynecomastia. Review of the MIP images confirms the above findings. IMPRESSION: 1. No pulmonary embolus or evidence of right heart strain. 2. Large area of consolidation of the right upper and lower lobe, consistent with pneumonia. Follow-up imaging recommended after treatment to ensure resolution and exclude superimposed neoplasm. 3. Small left pleural effusion. 4. Large volume upper abdominal ascites. Aortic Atherosclerosis (ICD10-I70.0). Electronically Signed   By: Franky Stanford M.D.   On: 07/05/2023 23:36   DG Chest Portable 1 View Result Date: 07/05/2023 CLINICAL DATA:  Chest pain EXAM: PORTABLE CHEST 1 VIEW COMPARISON:  06/16/2023, 06/19/2022, chest CT 09/14/2022 FINDINGS: Stable enlarged cardiomediastinal silhouette. Heterogeneous left lung base opacity. Increasing right hilar asymmetric density and opacity. No pleural effusion or pneumothorax IMPRESSION: 1. Increasing right hilar asymmetric density and opacity, mass lesion is a concern. Recommend correlation with contrast-enhanced chest CT. 2. Heterogeneous left lung base opacity, atelectasis versus pneumonia. Electronically Signed   By: Luke Bun M.D.   On: 07/05/2023 20:59     Data Reviewed: Relevant notes from primary care and specialist visits, past discharge summaries as available in EHR, including Care Everywhere. Prior diagnostic testing as pertinent to current admission diagnoses Updated medications and problem lists for reconciliation ED course, including vitals, labs, imaging, treatment and response to treatment Triage notes, nursing and pharmacy notes and ED provider's notes Notable results as noted in HPI   Assessment and Plan:   Pneumonia, HCAP:   Vanco and cefepime  -Mucinex  for cough  -Incentive  spirometry -Nasal cannula oxygen as needed to maintain O2 saturation 93% or greater -Repeat imaging will be needed due to findings on CT chest   COPD (chronic obstructive pulmonary disease) (HCC) -Bronchodilators    ESRD on hemodialysis (MWF) -Consulted Dr. Marcelino of renal for dialysis   Essential hypertension -IV hydralazine  as needed -Oral hydralazine , Imdur    CAD (coronary artery disease) -Crestor    Hypothyroidism -Synthroid    Schizophrenia (HCC) -Artane , prolixin , Tegretol    Blindness of both eyes -Fall precaution  DVT prophylaxis: Heparin   Consults: Renal  Advance Care Planning:   Code Status: Prior   Family Communication: none  Disposition Plan: Back to previous home environment  Severity of Illness: The appropriate patient status for this patient is INPATIENT. Inpatient status is judged to be reasonable and necessary in order to provide the required intensity of service to ensure the patient's safety. The patient's presenting symptoms, physical exam findings, and initial radiographic and laboratory data in the context of their chronic comorbidities is felt to place them at high risk for further clinical deterioration. Furthermore, it is not anticipated that the patient will be medically stable for discharge from the hospital within 2 midnights of admission.   * I certify that at the point of admission it is my clinical judgment that the patient will require inpatient hospital care spanning beyond 2 midnights from the point of admission due to high intensity of service, high risk for further deterioration and high frequency of surveillance required.*  Author: Delayne LULLA Solian, MD 07/06/2023 2:12 AM  For on call review [email or web address].

## 2023-07-06 NOTE — ED Notes (Signed)
 Pt hit call bell, This tech went to check on Pt, Pt began yelling at her about his dialysis treatment for today. This tech informed pt that she would check with his nurse as to when his treatment may be. This tech asked if Pt was comfortable and needed anything, Pt did not respond.  RN informed of Pt activity.

## 2023-07-06 NOTE — Progress Notes (Signed)
 ED Pharmacy Antibiotic Sign Off An antibiotic consult was received from an ED provider for Vancomycin  per pharmacy dosing for pneumonia. A chart review was completed to assess appropriateness.   The following one time order(s) were placed:  Vancomycin  1500 mg per pt wt: 67.4 kg per progress note on 06/19/23  Further antibiotic and/or antibiotic pharmacy consults should be ordered by the admitting provider if indicated.   Thank you for allowing pharmacy to be a part of this patient's care.   Thank you, Rankin CANDIE Dills, PharmD, [facility] [place] 07/06/2023 1:13 AM

## 2023-07-06 NOTE — Progress Notes (Signed)
 Hemodialysis Note:  Received patient in bed to unit. Alert and oriented. Verbal consent given by patient  Treatment initiated: 1442 Treatment completed: 1800  Access used: Left Fistula Access issues: None  Patient tolerated well. Transported back to room, alert without acute distress. Report given to patient's RN.  Total UF removed: 2 Liters Medications given: None  Post HD weight: weighing scale not working  [facility]

## 2023-07-07 DIAGNOSIS — N186 End stage renal disease: Secondary | ICD-10-CM | POA: Diagnosis not present

## 2023-07-07 DIAGNOSIS — J189 Pneumonia, unspecified organism: Secondary | ICD-10-CM | POA: Diagnosis not present

## 2023-07-07 DIAGNOSIS — R079 Chest pain, unspecified: Secondary | ICD-10-CM | POA: Diagnosis not present

## 2023-07-07 DIAGNOSIS — E039 Hypothyroidism, unspecified: Secondary | ICD-10-CM

## 2023-07-07 DIAGNOSIS — J449 Chronic obstructive pulmonary disease, unspecified: Secondary | ICD-10-CM

## 2023-07-07 DIAGNOSIS — F203 Undifferentiated schizophrenia: Secondary | ICD-10-CM

## 2023-07-07 DIAGNOSIS — I10 Essential (primary) hypertension: Secondary | ICD-10-CM

## 2023-07-07 DIAGNOSIS — J9601 Acute respiratory failure with hypoxia: Secondary | ICD-10-CM | POA: Diagnosis not present

## 2023-07-07 DIAGNOSIS — J9621 Acute and chronic respiratory failure with hypoxia: Secondary | ICD-10-CM

## 2023-07-07 LAB — RENAL FUNCTION PANEL
Albumin: 3 g/dL — ABNORMAL LOW (ref 3.5–5.0)
Anion gap: 14 (ref 5–15)
BUN: 35 mg/dL — ABNORMAL HIGH (ref 8–23)
CO2: 25 mmol/L (ref 22–32)
Calcium: 9.2 mg/dL (ref 8.9–10.3)
Chloride: 93 mmol/L — ABNORMAL LOW (ref 98–111)
Creatinine, Ser: 7.06 mg/dL — ABNORMAL HIGH (ref 0.61–1.24)
GFR, Estimated: 8 mL/min — ABNORMAL LOW (ref >60)
Glucose, Bld: 157 mg/dL — ABNORMAL HIGH (ref 70–99)
Phosphorus: 2.7 mg/dL (ref 2.5–4.6)
Potassium: 4.2 mmol/L (ref 3.5–5.1)
Sodium: 132 mmol/L — ABNORMAL LOW (ref 135–145)

## 2023-07-07 LAB — CBC
HCT: 35 % — ABNORMAL LOW (ref 39.0–52.0)
Hemoglobin: 12.1 g/dL — ABNORMAL LOW (ref 13.0–17.0)
MCH: 35.4 pg — ABNORMAL HIGH (ref 26.0–34.0)
MCHC: 34.6 g/dL (ref 30.0–36.0)
MCV: 102.3 fL — ABNORMAL HIGH (ref 80.0–100.0)
Platelets: 216 10*3/uL (ref 150–400)
RBC: 3.42 MIL/uL — ABNORMAL LOW (ref 4.22–5.81)
RDW: 14.9 % (ref 11.5–15.5)
WBC: 5 10*3/uL (ref 4.0–10.5)
nRBC: 0 % (ref 0.0–0.2)

## 2023-07-07 MED ORDER — CEFUROXIME AXETIL 250 MG PO TABS: 250.0000 mg | ORAL_TABLET | Freq: Every day | ORAL | Status: DC

## 2023-07-07 MED ORDER — CEFUROXIME AXETIL 250 MG PO TABS
250.0000 mg | ORAL_TABLET | Freq: Every day | ORAL | Status: DC
  Filled 2023-07-07: qty 1

## 2023-07-07 MED ORDER — AZITHROMYCIN 500 MG PO TABS: 500.0000 mg | ORAL_TABLET | Freq: Every day | ORAL | 0 refills | Status: AC

## 2023-07-07 MED ORDER — LIDOCAINE-PRILOCAINE 2.5-2.5 % EX CREA: 1.0000 | TOPICAL_CREAM | CUTANEOUS | Status: DC | PRN

## 2023-07-07 MED ORDER — CEFUROXIME AXETIL 250 MG PO TABS: 250.0000 mg | ORAL_TABLET | Freq: Every day | ORAL | 0 refills | Status: AC

## 2023-07-07 MED ORDER — HEPARIN SODIUM (PORCINE) 1000 UNIT/ML DIALYSIS: 1000.0000 [IU] | INTRAMUSCULAR | Status: DC | PRN

## 2023-07-07 MED ORDER — PENTAFLUOROPROP-TETRAFLUOROETH EX AERO: 1.0000 | INHALATION_SPRAY | CUTANEOUS | Status: DC | PRN

## 2023-07-07 MED ORDER — AZITHROMYCIN 250 MG PO TABS: 250.0000 mg | ORAL_TABLET | Freq: Every day | ORAL | 0 refills | Status: DC

## 2023-07-07 MED ORDER — AZITHROMYCIN 500 MG PO TABS: 500.0000 mg | ORAL_TABLET | Freq: Every day | ORAL | Status: DC

## 2023-07-07 NOTE — Progress Notes (Signed)
 [facility]  ROUNDING NOTE   Subjective:   Todd Brown is a 63 year old male with past medical conditions including COPD, hypertension, DVT, schizophrenia, and end-stage renal disease on hemodialysis.  Patient presents to the emergency department with chest pain and cough.  Patient has been admitted for Shortness of breath [R06.02] Acute respiratory failure with hypoxia (HCC) [J96.01] ESRD needing dialysis (HCC) [N18.6, Z99.2] Nonspecific chest pain [R07.9] Sepsis due to pneumonia (HCC) [J18.9, A41.9] Community acquired pneumonia of right lung, unspecified part of lung [J18.9]  Patient is known to our practice and receives outpatient dialysis treatments at [facility] on a MWF schedule, supervised by Dr. Marcelino.    Patient seen sitting up in bed in ED Alert 2 L nasal cannula No lower extremity edema  Dialysis scheduled for this afternoon   Objective:  Vital signs in last 24 hours:  Temp:  [97.9 F (36.6 C)-98.7 F (37.1 C)] 98.5 F (36.9 C) (02/05 1227) Pulse Rate:  [41-82] 82 (02/05 1237) Resp:  [11-23] 19 (02/05 1237) BP: (115-160)/(76-118) 129/84 (02/05 1237) SpO2:  [94 %-99 %] 97 % (02/05 1237)  Weight change:  Filed Weights    Intake/Output: I/O last 3 completed shifts: In: -  Out: 2000 [Other:2000]   Intake/Output this shift:  No intake/output data recorded.  Physical Exam: General: NAD  Head: Normocephalic, atraumatic. Moist oral mucosal membranes  Eyes: Anicteric  Lungs:  Clear to auscultation, NCO 2, productive cough  Heart: Regular rate and rhythm  Abdomen:  Soft, nontender, distended  Extremities: Trace peripheral edema.  Neurologic: Alert, moving all four extremities  Skin: No lesions  Access: Left aVF    Basic Metabolic Panel: Recent Labs  Lab 07/05/23 2007 07/06/23 1448 07/07/23 1241  NA 139 134* 132*  K 4.4 4.3 4.2  CL 95* 93* 93*  CO2 28 23 25   GLUCOSE 96 103* 157*  BUN 47* 54* 35*  CREATININE 8.99* 9.76*  7.06*  CALCIUM  9.4 9.4 9.2  PHOS  --  2.8 2.7    Liver Function Tests: Recent Labs  Lab 07/06/23 1448 07/07/23 1241  ALBUMIN  3.1* 3.0*   No results for input(s): LIPASE, AMYLASE in the last 168 hours. No results for input(s): AMMONIA in the last 168 hours.  CBC: Recent Labs  Lab 07/05/23 2007 07/06/23 1448 07/07/23 1241  WBC 7.5 6.0 5.0  NEUTROABS 5.8  --   --   HGB 11.4* 10.2* 12.1*  HCT 33.9* 29.7* 35.0*  MCV 104.6* 101.0* 102.3*  PLT 196 221 216    Cardiac Enzymes: No results for input(s): CKTOTAL, CKMB, CKMBINDEX, TROPONINI in the last 168 hours.  BNP: Invalid input(s): POCBNP  CBG: No results for input(s): GLUCAP in the last 168 hours.   Microbiology: Results for orders placed or performed during the hospital encounter of 07/05/23  Resp panel by RT-PCR (RSV, Flu A&B, Covid) Anterior Nasal Swab     Status: None   Collection Time: 07/06/23 12:11 AM   Specimen: Anterior Nasal Swab  Result Value Ref Range Status   SARS Coronavirus 2 by RT PCR NEGATIVE NEGATIVE Final    Comment: (NOTE) SARS-CoV-2 target nucleic acids are NOT DETECTED.  The SARS-CoV-2 RNA is generally detectable in upper respiratory specimens during the acute phase of infection. The lowest concentration of SARS-CoV-2 viral copies this assay can detect is 138 copies/mL. A negative result does not preclude SARS-Cov-2 infection and should not be used as the sole basis for treatment or other patient management decisions. A negative result  may occur with  improper specimen collection/handling, submission of specimen other than nasopharyngeal swab, presence of viral mutation(s) within the areas targeted by this assay, and inadequate number of viral copies(<138 copies/mL). A negative result must be combined with clinical observations, patient history, and epidemiological information. The expected result is Negative.  Fact Sheet for Patients:   bloggercourse.com  Fact Sheet for Healthcare Providers:  seriousbroker.it  This test is no t yet approved or cleared by the [place]  FDA and  has been authorized for detection and/or diagnosis of SARS-CoV-2 by FDA under an Emergency Use Authorization (EUA). This EUA will remain  in effect (meaning this test can be used) for the duration of the COVID-19 declaration under Section 564(b)(1) of the Act, 21 U.S.C.section 360bbb-3(b)(1), unless the authorization is terminated  or revoked sooner.       Influenza A by PCR NEGATIVE NEGATIVE Final   Influenza B by PCR NEGATIVE NEGATIVE Final    Comment: (NOTE) The Xpert Xpress SARS-CoV-2/FLU/RSV plus assay is intended as an aid in the diagnosis of influenza from Nasopharyngeal swab specimens and should not be used as a sole basis for treatment. Nasal washings and aspirates are unacceptable for Xpert Xpress SARS-CoV-2/FLU/RSV testing.  Fact Sheet for Patients: bloggercourse.com  Fact Sheet for Healthcare Providers: seriousbroker.it  This test is not yet approved or cleared by the [place]  FDA and has been authorized for detection and/or diagnosis of SARS-CoV-2 by FDA under an Emergency Use Authorization (EUA). This EUA will remain in effect (meaning this test can be used) for the duration of the COVID-19 declaration under Section 564(b)(1) of the Act, 21 U.S.C. section 360bbb-3(b)(1), unless the authorization is terminated or revoked.     Resp Syncytial Virus by PCR NEGATIVE NEGATIVE Final    Comment: (NOTE) Fact Sheet for Patients: bloggercourse.com  Fact Sheet for Healthcare Providers: seriousbroker.it  This test is not yet approved or cleared by the [place]  FDA and has been authorized for detection and/or diagnosis of SARS-CoV-2 by FDA under an Emergency Use  Authorization (EUA). This EUA will remain in effect (meaning this test can be used) for the duration of the COVID-19 declaration under Section 564(b)(1) of the Act, 21 U.S.C. section 360bbb-3(b)(1), unless the authorization is terminated or revoked.  Performed at [facility], [address] [place], [place] [address]   Blood culture (routine x 2)     Status: None (Preliminary result)   Collection Time: 07/06/23 12:22 AM   Specimen: BLOOD  Result Value Ref Range Status   Specimen Description BLOOD BLOOD RIGHT HAND  Final   Special Requests   Final    BOTTLES DRAWN AEROBIC AND ANAEROBIC Blood Culture results may not be optimal due to an inadequate volume of blood received in culture bottles   Culture   Final    NO GROWTH 1 DAY Performed at [facility], [address] [place], [place] [address]    Report Status PENDING  Incomplete  Blood culture (routine x 2)     Status: None (Preliminary result)   Collection Time: 07/06/23 12:23 AM   Specimen: BLOOD  Result Value Ref Range Status   Specimen Description BLOOD RIGHT FOREARM  Final   Special Requests   Final    BOTTLES DRAWN AEROBIC AND ANAEROBIC Blood Culture results may not be optimal due to an inadequate volume of blood received in culture bottles   Culture   Final    NO GROWTH 1 DAY Performed at [facility], [address] [place], [place] [address]    Report Status PENDING  Incomplete  MRSA Next Gen by PCR, Nasal     Status: None   Collection Time: 07/06/23  4:00 AM   Specimen: Nasal Mucosa; Nasal Swab  Result Value Ref Range Status   MRSA by PCR Next Gen NOT DETECTED NOT DETECTED Final    Comment: (NOTE) The GeneXpert MRSA Assay (FDA approved for NASAL specimens only), is one component of a comprehensive MRSA colonization surveillance program. It is not intended to diagnose MRSA infection nor to guide or monitor treatment for MRSA infections. Test performance is not FDA approved in  patients less than 42 years old. Performed at [facility], [address] [place], [place] [address]     Coagulation Studies: No results for input(s): LABPROT, INR in the last 72 hours.  Urinalysis: No results for input(s): COLORURINE, LABSPEC, PHURINE, GLUCOSEU, HGBUR, BILIRUBINUR, KETONESUR, PROTEINUR, UROBILINOGEN, NITRITE, LEUKOCYTESUR in the last 72 hours.  Invalid input(s): APPERANCEUR    Imaging: CT Angio Chest PE W/Cm &/Or Wo Cm Result Date: 07/05/2023 CLINICAL DATA:  Cough with decreased lung sounds EXAM: CT ANGIOGRAPHY CHEST WITH CONTRAST TECHNIQUE: Multidetector CT imaging of the chest was performed using the standard protocol during bolus administration of intravenous contrast. Multiplanar CT image reconstructions and MIPs were obtained to evaluate the vascular anatomy. RADIATION DOSE REDUCTION: This exam was performed according to the departmental dose-optimization program which includes automated exposure control, adjustment of the mA and/or kV according to patient size and/or use of iterative reconstruction technique. CONTRAST:  75mL OMNIPAQUE  IOHEXOL  350 MG/ML SOLN COMPARISON:  11/03/2022 FINDINGS: Cardiovascular: Contrast injection is sufficient to demonstrate satisfactory opacification of the pulmonary arteries to the segmental level. There is no pulmonary embolus or evidence of right heart strain. The size of the main pulmonary artery is normal. Moderate cardiomegaly. No pericardial effusion. The course and caliber of the aorta are normal. There is no atherosclerotic calcification. Opacification decreased due to pulmonary arterial phase contrast bolus timing. Mediastinum/Nodes: No mediastinal, hilar or axillary lymphadenopathy. Normal visualized thyroid . Thoracic esophageal course is normal. Lungs/Pleura: There is a large area of consolidation of the right upper and lower lobe. Smaller left lower lobe consolidation. Small left pleural  effusion. Upper Abdomen: Contrast bolus timing is not optimized for evaluation of the abdominal organs. Large volume upper abdominal ascites. Thickened appearance of both adrenal glands. Bilateral renal atrophy. Musculoskeletal: No chest wall abnormality. No bony spinal canal stenosis. Other: Bilateral gynecomastia. Review of the MIP images confirms the above findings. IMPRESSION: 1. No pulmonary embolus or evidence of right heart strain. 2. Large area of consolidation of the right upper and lower lobe, consistent with pneumonia. Follow-up imaging recommended after treatment to ensure resolution and exclude superimposed neoplasm. 3. Small left pleural effusion. 4. Large volume upper abdominal ascites. Aortic Atherosclerosis (ICD10-I70.0). Electronically Signed   By: Franky Stanford M.D.   On: 07/05/2023 23:36   DG Chest Portable 1 View Result Date: 07/05/2023 CLINICAL DATA:  Chest pain EXAM: PORTABLE CHEST 1 VIEW COMPARISON:  06/16/2023, 06/19/2022, chest CT 09/14/2022 FINDINGS: Stable enlarged cardiomediastinal silhouette. Heterogeneous left lung base opacity. Increasing right hilar asymmetric density and opacity. No pleural effusion or pneumothorax IMPRESSION: 1. Increasing right hilar asymmetric density and opacity, mass lesion is a concern. Recommend correlation with contrast-enhanced chest CT. 2. Heterogeneous left lung base opacity, atelectasis versus pneumonia. Electronically Signed   By: Luke Bun M.D.   On: 07/05/2023 20:59     Medications:      [  START ON 07/08/2023] azithromycin   500 mg Oral Q breakfast   calcium  acetate  1,334 mg Oral TID WC   cefUROXime   250 mg Oral Q supper   Chlorhexidine  Gluconate Cloth  6 each Topical Q0600   heparin   5,000 Units Subcutaneous Q8H   hydrALAZINE   100 mg Oral Q8H   isosorbide  mononitrate  60 mg Oral Daily   levothyroxine   125 mcg Oral Q0600   mometasone -formoterol   2 puff Inhalation BID   pantoprazole   40 mg Oral Daily   rosuvastatin   10 mg Oral QHS    acetaminophen  **OR** acetaminophen , guaiFENesin -dextromethorphan , heparin , hyoscyamine , ipratropium-albuterol , lidocaine -prilocaine , ondansetron  **OR** ondansetron  (ZOFRAN ) IV, pentafluoroprop-tetrafluoroeth  Assessment/ Plan:  Mr. ANDRIK SANDT is a 63 y.o.  male  with past medical conditions including COPD, hypertension, DVT, schizophrenia, and end-stage renal disease on hemodialysis.  Patient presents to the emergency department with chest pain and cough.  Patient has been admitted for Shortness of breath [R06.02] Acute respiratory failure with hypoxia (HCC) [J96.01] ESRD needing dialysis (HCC) [N18.6, Z99.2] Nonspecific chest pain [R07.9] Sepsis due to pneumonia (HCC) [J18.9, A41.9] Community acquired pneumonia of right lung, unspecified part of lung [J18.9]  [facility]/MWF/left aVF   End-stage renal disease on hemodialysis.  Patient received short dialysis treatment yesterday, UF 2 L achieved.  Patient receiving treatment today to maintain outpatient schedule.  Next treatment scheduled for Friday.  2. Anemia of chronic kidney disease Lab Results  Component Value Date   HGB 12.1 (L) 07/07/2023  Patient does receive Mircera at outpatient clinic.  Hgb within desired range.   3. Secondary Hyperparathyroidism: with outpatient labs: PTH 356, phosphorus 4.1, calcium  8.9 on 06/14/23.   Lab Results  Component Value Date   PTH 194 (H) 05/11/2017   CALCIUM  9.2 07/07/2023   CAION 1.19 03/27/2022   PHOS 2.7 07/07/2023    Bone minerals stable. Currently prescribed calcium  acetate with meals.  4.  Hypertension with chronic kidney disease.  Home regimen includes hydralazine  and isosorbide .  Both currently prescribed.  Blood pressure 126/72.    LOS: 1 [facility] 2/5/20251:07 PM

## 2023-07-07 NOTE — ED Notes (Signed)
 Called c com for transport to [facility]  [phone]

## 2023-07-07 NOTE — ED Notes (Signed)
 Pt sleeping w/ even and unlabored respirations.

## 2023-07-07 NOTE — ED Notes (Addendum)
 Pt repositioned in bed for comfort, given water and turned on tv per request, call bell within reach.

## 2023-07-07 NOTE — TOC Transition Note (Signed)
 Transition of Care [facility]) - Discharge Note   Patient Details  Name: Todd Brown MRN: [id number] Date of Birth: 06/11/1960  [facility] CM/SW Contact:  Silvano Molt, LCSW Phone Number: 07/07/2023, 11:07 AM   Clinical Narrative:     CSW received notification from attending, pt will be able to return to facility once he finishes HD session. CSW contacted Mr Rosana to make him aware. CSW informed RN pt will need transport back to facility.     Final next level of care: Assisted Living Barriers to Discharge: No Barriers Identified   Patient Goals and CMS Choice            Discharge Placement                Patient to be transferred to facility by: [facility] non emergency transport Name of family member notified: Mr. Rosana Patient and family notified of of transfer: 07/07/23  Discharge Plan and Services Additional resources added to the After Visit Summary for                                       Social Drivers of Health (SDOH) Interventions SDOH Screenings   Food Insecurity: No Food Insecurity (06/18/2023)  Housing: Unknown (06/18/2023)  Transportation Needs: Patient Unable To Answer (06/18/2023)  Utilities: Patient Unable To Answer (06/18/2023)  Alcohol Screen: Low Risk  (10/05/2019)  Financial Resource Strain: Low Risk  (10/08/2018)  Physical Activity: Unknown (10/08/2018)  Social Connections: Unknown (10/08/2018)  Stress: No Stress Concern Present (10/08/2018)  Tobacco Use: Low Risk  (06/16/2023)     Readmission Risk Interventions    07/06/2023    9:31 AM 06/17/2023    2:42 PM 11/05/2022    4:39 PM  Readmission Risk Prevention Plan  Transportation Screening Complete Complete Complete  PCP or Specialist Appt within 3-5 Days Complete    Social Work Consult for Recovery Care Planning/Counseling Complete    Palliative Care Screening Complete    Medication Review Oceanographer) Complete Complete Complete  PCP or Specialist appointment within  3-5 days of discharge  Complete   HRI or Home Care Consult   Complete  SW Recovery Care/Counseling Consult  Complete Complete  Palliative Care Screening  Not Applicable   Skilled Nursing Facility  Not Applicable Not Applicable

## 2023-07-07 NOTE — Progress Notes (Signed)
 Hemodialysis Note:  Received patient in bed to unit. Alert and oriented. Verbal consent was given by patient  Treatment initiated: 1237 Treatment completed: 1515  Access used: Left Fistula Access issues: None  Patient tolerated well. Transported back to room, alert without acute distress. Report given to patient's RN.  Total UF removed: 2 Liters Medications given: None  Post HD weight: 66 Kg  [facility]

## 2023-07-07 NOTE — Discharge Summary (Signed)
 Physician Discharge Summary   Patient: Todd Brown MRN: [id number] DOB: [birth year]  Admit date:     07/05/2023  Discharge date: 07/07/23  Discharge Physician: Amaryllis Dare   PCP: Venson Candis LITTIE, NP   Recommendations at discharge:  Please obtain CBC and BMP on follow-up Continue with routine dialysis Please ensure completion of antibiotics Follow-up with primary care provider  Discharge Diagnoses: Active Problems:   Acute on chronic respiratory failure with hypoxia (HCC)   Community acquired pneumonia of right lung   COPD (chronic obstructive pulmonary disease) (HCC)   Hypoxia   ESRD on hemodialysis (HCC)   Essential hypertension   CAD (coronary artery disease)   Hypothyroidism   Schizophrenia (HCC)   HTN (hypertension)   ESRD needing dialysis (HCC)   Nonspecific chest pain   Hospital Course: Taken from H&P.   Todd Brown is a 63 y.o. male with medical history significant for  hypertension, hyperlipidemia, CAD, COPD, chronic hypoxic respiratory failure on 2 L, ESRD on HD (MWF), and schizophrenia being admitted for pneumonia after resenting to the ED complaining of chest pain and shortness of breath in the setting of missing his dialysis session earlier in the day.   EMS found him to have sats in the mid 80s on room air.  Patient was recently admitted from 1/15 to 06/20/2023 with influenza A.  He had a palliative care consult during that visit. ED course: BP 139/100, O2 sats 89% on room air improving to 93% on 2 L. Workup notable for negative respiratory viral panel, normal WBC, procalcitonin 0.81.   CTA chest PE protocol negative for PE but showing large area of consolidation right upper and lower lobe consistent with pneumonia recommending follow-up imaging to rule out neoplasm   Patient received a dose of cefepime  and vancomycin , later started on ceftriaxone  and Zithromax .  2/4: Afebrile with stable vital, MRSA PCR negative.  Preliminary blood cultures negative.   Patient is now back to his baseline oxygen requirement.  Appears to have significant cognitive impairment and unable to communicate well, just yell and shout.  Lives in assisted living facility, DSS is guardian.  2/5: Patient remained hemodynamically stable.  Blood cultures remain negative.  Saturating well on baseline oxygen requirement.  Received dialysis yesterday and his routine today.  Patient is being discharged on Zithromax  and cefuroxime  to complete the course for pneumonia.  He will continue the rest of his home medications and routine dialysis and follow-up with his providers for further recommendations.     Consultants: Nephrology Procedures performed: Hemodialysis Disposition: Assisted living Diet recommendation:  Discharge Diet Orders (From admission, onward)     Start     Ordered   07/07/23 0000  Diet - low sodium heart healthy        07/07/23 1047           Cardiac diet DISCHARGE MEDICATION: Allergies as of 07/07/2023       Reactions   Chlorpromazine Other (See Comments)   Reaction:  Unknown , pt states it makes him feel real bad Reaction:  Unknown , pt states it makes him feel real bad        Medication List     STOP taking these medications    oseltamivir  30 MG capsule Commonly known as: TAMIFLU        TAKE these medications    albuterol  108 (90 Base) MCG/ACT inhaler Commonly known as: VENTOLIN  HFA Inhale 1-2 puffs into the lungs every 4 (four) hours as needed for wheezing  or shortness of breath.   azithromycin  500 MG tablet Commonly known as: ZITHROMAX  Take 1 tablet (500 mg total) by mouth daily with breakfast for 3 days. Start taking on: July 08, 2023   b complex-vitamin c -folic acid  0.8 MG Tabs tablet Take 1 tablet by mouth at bedtime.   calcium  acetate 667 MG capsule Commonly known as: PHOSLO  Take 1,334 mg by mouth 3 (three) times daily with meals. 0800/1400/2000   carbamazepine  200 MG tablet Commonly known as: TEGRETOL  Take  1,000 mg by mouth every morning.   cefUROXime  250 MG tablet Commonly known as: CEFTIN  Take 1 tablet (250 mg total) by mouth daily with supper for 4 doses.   cholecalciferol  1000 units tablet Commonly known as: VITAMIN D  Take 1,000 Units by mouth in the morning.   ferrous sulfate  325 (65 FE) MG tablet Take 1 tablet (325 mg total) by mouth daily.   Fluticasone -Salmeterol 250-50 MCG/DOSE Aepb Commonly known as: ADVAIR Inhale 1 puff into the lungs 2 (two) times daily.   guaiFENesin -dextromethorphan  100-10 MG/5ML syrup Commonly known as: ROBITUSSIN DM Take 5 mLs by mouth every 4 (four) hours as needed for cough.   hydrALAZINE  100 MG tablet Commonly known as: APRESOLINE  Take 1 tablet (100 mg total) by mouth every 8 (eight) hours.   hyoscyamine  0.125 MG SL tablet Commonly known as: LEVSIN  SL Place 0.125 mg under the tongue every 4 (four) hours as needed for cramping.   ipratropium-albuterol  0.5-2.5 (3) MG/3ML Soln Commonly known as: DUONEB Take 3 mLs by nebulization every 6 (six) hours as needed.   isosorbide  mononitrate 60 MG 24 hr tablet Commonly known as: IMDUR  Take 1 tablet (60 mg total) by mouth daily.   levothyroxine  125 MCG tablet Commonly known as: Synthroid  Take 1 tablet (125 mcg total) by mouth daily.   nicotine  21 mg/24hr patch Commonly known as: NICODERM CQ  - dosed in mg/24 hours Place 1 patch (21 mg total) onto the skin daily.   omeprazole  40 MG capsule Commonly known as: PRILOSEC Take 40 mg by mouth every morning.   polyethylene glycol 17 g packet Commonly known as: MIRALAX  / GLYCOLAX  Take 17 g by mouth daily as needed for mild constipation.   rosuvastatin  10 MG tablet Commonly known as: CRESTOR  Take 10 mg by mouth at bedtime.   traMADol  50 MG tablet Commonly known as: ULTRAM  Take 50 mg by mouth 4 (four) times daily as needed.   trihexyphenidyl  2 MG tablet Commonly known as: ARTANE  Take 2 mg by mouth 2 (two) times daily.   zinc  sulfate (50mg   elemental zinc ) 220 (50 Zn) MG capsule Take 220 mg by mouth daily.        Follow-up Information     Venson Candis CROME, NP. Schedule an appointment as soon as possible for a visit in 1 week(s).   Specialty: Family Medicine Contact information: [address] [place] [address] [phone]                Discharge Exam: Filed Weights   General.  Cognitively impaired gentleman, in no acute distress. Pulmonary.  Lungs clear bilaterally, normal respiratory effort. CV.  Regular rate and rhythm, no JVD, rub or murmur. Abdomen.  Soft, nontender, nondistended, BS positive. CNS.  Alert and oriented x 2.  No focal neurologic deficit. Extremities.  No edema, no cyanosis, pulses intact and symmetrical.  Condition at discharge: stable  The results of significant diagnostics from this hospitalization (including imaging, microbiology, ancillary and laboratory) are listed below for reference.  Imaging Studies: CT Angio Chest PE W/Cm &/Or Wo Cm Result Date: 07/05/2023 CLINICAL DATA:  Cough with decreased lung sounds EXAM: CT ANGIOGRAPHY CHEST WITH CONTRAST TECHNIQUE: Multidetector CT imaging of the chest was performed using the standard protocol during bolus administration of intravenous contrast. Multiplanar CT image reconstructions and MIPs were obtained to evaluate the vascular anatomy. RADIATION DOSE REDUCTION: This exam was performed according to the departmental dose-optimization program which includes automated exposure control, adjustment of the mA and/or kV according to patient size and/or use of iterative reconstruction technique. CONTRAST:  75mL OMNIPAQUE  IOHEXOL  350 MG/ML SOLN COMPARISON:  11/03/2022 FINDINGS: Cardiovascular: Contrast injection is sufficient to demonstrate satisfactory opacification of the pulmonary arteries to the segmental level. There is no pulmonary embolus or evidence of right heart strain. The size of the main pulmonary artery is normal. Moderate  cardiomegaly. No pericardial effusion. The course and caliber of the aorta are normal. There is no atherosclerotic calcification. Opacification decreased due to pulmonary arterial phase contrast bolus timing. Mediastinum/Nodes: No mediastinal, hilar or axillary lymphadenopathy. Normal visualized thyroid . Thoracic esophageal course is normal. Lungs/Pleura: There is a large area of consolidation of the right upper and lower lobe. Smaller left lower lobe consolidation. Small left pleural effusion. Upper Abdomen: Contrast bolus timing is not optimized for evaluation of the abdominal organs. Large volume upper abdominal ascites. Thickened appearance of both adrenal glands. Bilateral renal atrophy. Musculoskeletal: No chest wall abnormality. No bony spinal canal stenosis. Other: Bilateral gynecomastia. Review of the MIP images confirms the above findings. IMPRESSION: 1. No pulmonary embolus or evidence of right heart strain. 2. Large area of consolidation of the right upper and lower lobe, consistent with pneumonia. Follow-up imaging recommended after treatment to ensure resolution and exclude superimposed neoplasm. 3. Small left pleural effusion. 4. Large volume upper abdominal ascites. Aortic Atherosclerosis (ICD10-I70.0). Electronically Signed   By: Franky Stanford M.D.   On: 07/05/2023 23:36   DG Chest Portable 1 View Result Date: 07/05/2023 CLINICAL DATA:  Chest pain EXAM: PORTABLE CHEST 1 VIEW COMPARISON:  06/16/2023, 06/19/2022, chest CT 09/14/2022 FINDINGS: Stable enlarged cardiomediastinal silhouette. Heterogeneous left lung base opacity. Increasing right hilar asymmetric density and opacity. No pleural effusion or pneumothorax IMPRESSION: 1. Increasing right hilar asymmetric density and opacity, mass lesion is a concern. Recommend correlation with contrast-enhanced chest CT. 2. Heterogeneous left lung base opacity, atelectasis versus pneumonia. Electronically Signed   By: Luke Bun M.D.   On: 07/05/2023  20:59   DG Chest 2 View Result Date: 06/16/2023 CLINICAL DATA:  Shortness of breath EXAM: CHEST - 2 VIEW COMPARISON:  04/30/2023 x-ray FINDINGS: Enlarged cardiopericardial silhouette. No pneumothorax or effusion. There is again prominence of the interstitium and central vasculature with fullness in the right lung hilum and persistent nodular opacity at the left lung base. IMPRESSION: Persistent fullness of the right lung hilum. Component of enlarged pulmonary arteries with possible although separate lymph nodes or other lesion is in the differential. Enlarged cardiac silhouette. Left retrocardiac opacity.  Possible infiltrate. Recommend follow up Electronically Signed   By: Ranell Bring M.D.   On: 06/16/2023 17:13    Microbiology: Results for orders placed or performed during the hospital encounter of 07/05/23  Resp panel by RT-PCR (RSV, Flu A&B, Covid) Anterior Nasal Swab     Status: None   Collection Time: 07/06/23 12:11 AM   Specimen: Anterior Nasal Swab  Result Value Ref Range Status   SARS Coronavirus 2 by RT PCR NEGATIVE NEGATIVE Final    Comment: (NOTE) SARS-CoV-2  target nucleic acids are NOT DETECTED.  The SARS-CoV-2 RNA is generally detectable in upper respiratory specimens during the acute phase of infection. The lowest concentration of SARS-CoV-2 viral copies this assay can detect is 138 copies/mL. A negative result does not preclude SARS-Cov-2 infection and should not be used as the sole basis for treatment or other patient management decisions. A negative result may occur with  improper specimen collection/handling, submission of specimen other than nasopharyngeal swab, presence of viral mutation(s) within the areas targeted by this assay, and inadequate number of viral copies(<138 copies/mL). A negative result must be combined with clinical observations, patient history, and epidemiological information. The expected result is Negative.  Fact Sheet for Patients:   bloggercourse.com  Fact Sheet for Healthcare Providers:  seriousbroker.it  This test is no t yet approved or cleared by the [place]  FDA and  has been authorized for detection and/or diagnosis of SARS-CoV-2 by FDA under an Emergency Use Authorization (EUA). This EUA will remain  in effect (meaning this test can be used) for the duration of the COVID-19 declaration under Section 564(b)(1) of the Act, 21 U.S.C.section 360bbb-3(b)(1), unless the authorization is terminated  or revoked sooner.       Influenza A by PCR NEGATIVE NEGATIVE Final   Influenza B by PCR NEGATIVE NEGATIVE Final    Comment: (NOTE) The Xpert Xpress SARS-CoV-2/FLU/RSV plus assay is intended as an aid in the diagnosis of influenza from Nasopharyngeal swab specimens and should not be used as a sole basis for treatment. Nasal washings and aspirates are unacceptable for Xpert Xpress SARS-CoV-2/FLU/RSV testing.  Fact Sheet for Patients: bloggercourse.com  Fact Sheet for Healthcare Providers: seriousbroker.it  This test is not yet approved or cleared by the [place]  FDA and has been authorized for detection and/or diagnosis of SARS-CoV-2 by FDA under an Emergency Use Authorization (EUA). This EUA will remain in effect (meaning this test can be used) for the duration of the COVID-19 declaration under Section 564(b)(1) of the Act, 21 U.S.C. section 360bbb-3(b)(1), unless the authorization is terminated or revoked.     Resp Syncytial Virus by PCR NEGATIVE NEGATIVE Final    Comment: (NOTE) Fact Sheet for Patients: bloggercourse.com  Fact Sheet for Healthcare Providers: seriousbroker.it  This test is not yet approved or cleared by the [place]  FDA and has been authorized for detection and/or diagnosis of SARS-CoV-2 by FDA under an Emergency Use  Authorization (EUA). This EUA will remain in effect (meaning this test can be used) for the duration of the COVID-19 declaration under Section 564(b)(1) of the Act, 21 U.S.C. section 360bbb-3(b)(1), unless the authorization is terminated or revoked.  Performed at [facility], [address] [place], [place] [address]   Blood culture (routine x 2)     Status: None (Preliminary result)   Collection Time: 07/06/23 12:22 AM   Specimen: BLOOD  Result Value Ref Range Status   Specimen Description BLOOD BLOOD RIGHT HAND  Final   Special Requests   Final    BOTTLES DRAWN AEROBIC AND ANAEROBIC Blood Culture results may not be optimal due to an inadequate volume of blood received in culture bottles   Culture   Final    NO GROWTH 1 DAY Performed at [facility], [address] [place], [place] [address]    Report Status PENDING  Incomplete  Blood culture (routine x 2)     Status: None (Preliminary result)   Collection Time: 07/06/23 12:23 AM   Specimen: BLOOD  Result Value  Ref Range Status   Specimen Description BLOOD RIGHT FOREARM  Final   Special Requests   Final    BOTTLES DRAWN AEROBIC AND ANAEROBIC Blood Culture results may not be optimal due to an inadequate volume of blood received in culture bottles   Culture   Final    NO GROWTH 1 DAY Performed at [facility], [address] [place], [place] [address]    Report Status PENDING  Incomplete  MRSA Next Gen by PCR, Nasal     Status: None   Collection Time: 07/06/23  4:00 AM   Specimen: Nasal Mucosa; Nasal Swab  Result Value Ref Range Status   MRSA by PCR Next Gen NOT DETECTED NOT DETECTED Final    Comment: (NOTE) The GeneXpert MRSA Assay (FDA approved for NASAL specimens only), is one component of a comprehensive MRSA colonization surveillance program. It is not intended to diagnose MRSA infection nor to guide or monitor treatment for MRSA infections. Test performance is not FDA approved in  patients less than 33 years old. Performed at [facility] [place], [address] [place], [place] [address]     Labs: CBC: Recent Labs  Lab 07/05/23 2007 07/06/23 1448  WBC 7.5 6.0  NEUTROABS 5.8  --   HGB 11.4* 10.2*  HCT 33.9* 29.7*  MCV 104.6* 101.0*  PLT 196 221   Basic Metabolic Panel: Recent Labs  Lab 07/05/23 2007 07/06/23 1448  NA 139 134*  K 4.4 4.3  CL 95* 93*  CO2 28 23  GLUCOSE 96 103*  BUN 47* 54*  CREATININE 8.99* 9.76*  CALCIUM  9.4 9.4  PHOS  --  2.8   Liver Function Tests: Recent Labs  Lab 07/06/23 1448  ALBUMIN  3.1*   CBG: No results for input(s): GLUCAP in the last 168 hours.  Discharge time spent: greater than 30 minutes.  This record has been created using Conservation officer, historic buildings. Errors have been sought and corrected,but may not always be located. Such creation errors do not reflect on the standard of care.   Signed: Amaryllis Dare, MD [facility] 07/07/2023

## 2023-07-11 LAB — CULTURE, BLOOD (ROUTINE X 2)
Culture: NO GROWTH
Culture: NO GROWTH

## 2023-07-14 ENCOUNTER — Other Ambulatory Visit (INDEPENDENT_AMBULATORY_CARE_PROVIDER_SITE_OTHER): Payer: Self-pay | Admitting: Nurse Practitioner

## 2023-07-14 DIAGNOSIS — N186 End stage renal disease: Secondary | ICD-10-CM

## 2023-07-14 NOTE — Progress Notes (Signed)
MRN : [id number]  Todd Brown is a 63 y.o. ([birth year] male who presents with chief complaint of check access.  History of Present Illness:   The patient is unable to provide any history.  He has an attendant with him but she also provides very little history.  There is no redness or swelling at the access site.  He had a referral sent for a whistling bruit however this referral was sent several months ago and they note that he has not been having any issues at dialysis   No recent shortening of the patient's walking distance or new symptoms consistent with claudication.  No history of rest pain symptoms. No new ulcers or wounds of the lower extremities have occurred.   The patient denies amaurosis fugax or recent TIA symptoms. There are no recent neurological changes noted. There is no history of DVT, PE or superficial thrombophlebitis. No recent episodes of angina or shortness of breath documented.    Duplex ultrasound of the AV access shows a patent access.  He has a flow volume of 1731.  There is a hematoma noted near the anastomosis does some pressure on the fistula but it does not cause any significant stenosis.  There is some stenosis noted near the confluence but it is mild  No outpatient medications have been marked as taking for the 07/15/23 encounter (Appointment) with Gilda Crease, Latina Craver, MD.    Past Medical History:  Diagnosis Date   Anemia    Arthritis    Chronic respiratory failure (HCC)    COPD (chronic obstructive pulmonary disease) (HCC)    Dialysis patient (HCC)    Mon. 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   ESRD (end stage renal disease) (HCC)    ESRD (end stage renal disease) on dialysis (HCC)    Hyperlipidemia    Hypertension    Malignant hypertension    Pneumonia 2016   Renal artery stenosis (HCC)    Schizophrenia (HCC)     Past Surgical History:  Procedure Laterality Date    A/V FISTULAGRAM N/A 08/06/2016   Procedure: A/V Fistulagram;  Surgeon: Annice Needy, MD;  Location: [facility];  Service: Cardiovascular;  Laterality: N/A;   A/V FISTULAGRAM N/A 02/14/2021   Procedure: A/V FISTULAGRAM poss Perm Cath Insertion;  Surgeon: Renford Dills, MD;  Location: [facility];  Service: Cardiovascular;  Laterality: N/A;   A/V FISTULAGRAM Left 08/26/2021   Procedure: A/V Fistulagram;  Surgeon: Annice Needy, MD;  Location: [facility];  Service: Cardiovascular;  Laterality: Left;   A/V SHUNT INTERVENTION N/A 08/06/2016   Procedure: A/V Shunt Intervention;  Surgeon: Annice Needy, MD;  Location: [facility];  Service: Cardiovascular;  Laterality: N/A;   AV FISTULA PLACEMENT Left 12/26/2014   Procedure: ARTERIOVENOUS (AV) FISTULA CREATION;  Surgeon: Annice Needy, MD;  Location: [facility];  Service: Vascular;  Laterality: Left;   AV FISTULA PLACEMENT     ESOPHAGOGASTRODUODENOSCOPY (EGD) WITH PROPOFOL N/A 05/06/2017   Procedure: ESOPHAGOGASTRODUODENOSCOPY (EGD) WITH PROPOFOL;  Surgeon: Wyline Mood, MD;  Location: [facility];  Service: Gastroenterology;  Laterality: N/A;   INSERTION OF DIALYSIS CATHETER Right    PERIPHERAL VASCULAR CATHETERIZATION N/A 10/22/2014   Procedure: Dialysis/Perma Catheter Insertion;  Surgeon: Annice Needy, MD;  Location: [facility];  Service: Cardiovascular;  Laterality: N/A;   PERIPHERAL VASCULAR CATHETERIZATION N/A 02/28/2015   Procedure: Dialysis/Perma Catheter Removal;  Surgeon: Annice Needy, MD;  Location: [facility];  Service: Cardiovascular;  Laterality: N/A;   Repair fx left lower leg     yrs ago (age 36)   REVISON OF ARTERIOVENOUS FISTULA Left 03/27/2022   Procedure: REVISON OF ARTERIOVENOUS FISTULA (JUMP GRAFT);  Surgeon: Renford Dills, MD;  Location: [facility];  Service: Vascular;  Laterality: Left;   TEE WITHOUT CARDIOVERSION N/A 09/21/2022   Procedure: TRANSESOPHAGEAL  ECHOCARDIOGRAM;  Surgeon: Debbe Odea, MD;  Location: [facility];  Service: Cardiovascular;  Laterality: N/A;   TONSILLECTOMY      Social History Social History   Tobacco Use   Smoking status: Never    Passive exposure: Never   Smokeless tobacco: Never  Vaping Use   Vaping status: Never Used  Substance Use Topics   Alcohol use: Not Currently   Drug use: Not Currently    Types: Marijuana    Comment: not since living at assisted living place 03/2020    Family History Family History  Problem Relation Age of Onset   Kidney disease Brother    Diabetes Neg Hx     Allergies  Allergen Reactions   Chlorpromazine Other (See Comments)    Reaction:  Unknown , pt states it makes him feel real bad Reaction:  Unknown , pt states it makes him feel real bad      REVIEW OF SYSTEMS (Negative unless checked)  Constitutional: [] Weight loss  [] Fever  [] Chills Cardiac: [] Chest pain   [] Chest pressure   [] Palpitations   [] Shortness of breath when laying flat   [] Shortness of breath with exertion. Vascular:  [] Pain in legs with walking   [] Pain in legs at rest  [] History of DVT   [] Phlebitis   [] Swelling in legs   [] Varicose veins   [] Non-healing ulcers Pulmonary:   [] Uses home oxygen   [] Productive cough   [] Hemoptysis   [] Wheeze  [] COPD   [] Asthma Neurologic:  [] Dizziness   [] Seizures   [] History of stroke   [] History of TIA  [] Aphasia   [] Vissual changes   [] Weakness or numbness in arm   [] Weakness or numbness in leg Musculoskeletal:   [] Joint swelling   [] Joint pain   [] Low back pain Hematologic:  [] Easy bruising  [] Easy bleeding   [] Hypercoagulable state   [] Anemic Gastrointestinal:  [] Diarrhea   [] Vomiting  [] Gastroesophageal reflux/heartburn   [] Difficulty swallowing. Genitourinary:  [x] Chronic kidney disease   [] Difficult urination  [] Frequent urination   [] Blood in urine Skin:  [] Rashes   [] Ulcers  Psychological:  [] History of anxiety   []  History of major depression.  Physical  Examination  There were no vitals filed for this visit. There is no height or weight on file to calculate BMI. Gen: WD/WN, NAD Head: [place]/AT, No temporalis wasting.  Ear/Nose/Throat: Hearing grossly intact, nares w/o erythema or drainage Eyes: PER, EOMI, sclera nonicteric.  Neck: Supple, no gross masses or lesions.  No JVD.  Pulmonary:  Good air movement, no audible wheezing, no use of accessory muscles.  Cardiac: RRR, precordium non-hyperdynamic. Vascular:   Good thrill and bruit Vessel Right Left  Radial Palpable Palpable  Brachial Palpable Palpable  Gastrointestinal: soft, non-distended. No guarding/no peritoneal signs.  Musculoskeletal: M/S 5/5 throughout.  No deformity.  Neurologic: CN 2-12 intact. Pain and light touch intact in extremities.  Symmetrical.  Speech is fluent. Motor exam as listed above. Psychiatric:  Poor historian judgment altered Dermatologic: No rashes or ulcers noted.  No changes consistent with cellulitis.   CBC Lab Results  Component Value Date   WBC 5.0 07/07/2023   HGB 12.1 (L) 07/07/2023   HCT 35.0 (L) 07/07/2023   MCV 102.3 (H) 07/07/2023   PLT 216 07/07/2023    BMET    Component Value Date/Time   NA 132 (L) 07/07/2023 1241   K 4.2 07/07/2023 1241   CL 93 (L) 07/07/2023 1241   CO2 25 07/07/2023 1241   GLUCOSE 157 (H) 07/07/2023 1241   BUN 35 (H) 07/07/2023 1241   CREATININE 7.06 (H) 07/07/2023 1241   CREATININE 3.47 (H) 03/23/2013 1501   CALCIUM 9.2 07/07/2023 1241   CALCIUM 8.1 (L) 10/22/2014 0504   GFRNONAA 8 (L) 07/07/2023 1241   GFRAA 7 (L) 10/23/2019 0405   Estimated Creatinine Clearance: 10.1 mL/min (A) (by C-G formula based on SCr of 7.06 mg/dL (H)).  COAG Lab Results  Component Value Date   INR 1.1 11/03/2022   INR 1.1 09/14/2022   INR 1.2 06/19/2022    Radiology CT Angio Chest PE W/Cm &/Or Wo Cm Result Date: 07/05/2023 CLINICAL DATA:  Cough with decreased lung sounds EXAM: CT ANGIOGRAPHY CHEST WITH CONTRAST TECHNIQUE:  Multidetector CT imaging of the chest was performed using the standard protocol during bolus administration of intravenous contrast. Multiplanar CT image reconstructions and MIPs were obtained to evaluate the vascular anatomy. RADIATION DOSE REDUCTION: This exam was performed according to the departmental dose-optimization program which includes automated exposure control, adjustment of the mA and/or kV according to patient size and/or use of iterative reconstruction technique. CONTRAST:  75mL OMNIPAQUE IOHEXOL 350 MG/ML SOLN COMPARISON:  11/03/2022 FINDINGS: Cardiovascular: Contrast injection is sufficient to demonstrate satisfactory opacification of the pulmonary arteries to the segmental level. There is no pulmonary embolus or evidence of right heart strain. The size of the main pulmonary artery is normal. Moderate cardiomegaly. No pericardial effusion. The course and caliber of the aorta are normal. There is no atherosclerotic calcification. Opacification decreased due to pulmonary arterial phase contrast bolus timing. Mediastinum/Nodes: No mediastinal, hilar or axillary lymphadenopathy. Normal visualized thyroid. Thoracic esophageal course is normal. Lungs/Pleura: There is a large area of consolidation of the right upper and lower lobe. Smaller left lower lobe consolidation. Small left pleural effusion. Upper Abdomen: Contrast bolus timing is not optimized for evaluation of the abdominal organs. Large volume upper abdominal ascites. Thickened appearance of both adrenal glands. Bilateral renal atrophy. Musculoskeletal: No chest wall abnormality. No bony spinal canal stenosis. Other: Bilateral gynecomastia. Review of the MIP images confirms the above findings. IMPRESSION: 1. No pulmonary embolus or evidence of right heart strain. 2. Large area of consolidation of the right upper and lower lobe, consistent with pneumonia. Follow-up imaging recommended after treatment to ensure resolution and exclude superimposed  neoplasm. 3. Small left pleural effusion. 4. Large volume upper abdominal ascites. Aortic Atherosclerosis (ICD10-I70.0). Electronically Signed   By: Deatra Robinson M.D.   On: 07/05/2023 23:36   DG Chest Portable 1 View Result Date: 07/05/2023 CLINICAL DATA:  Chest pain EXAM: PORTABLE CHEST 1 VIEW COMPARISON:  06/16/2023, 06/19/2022, chest CT 09/14/2022 FINDINGS: Stable enlarged cardiomediastinal silhouette. Heterogeneous left lung base opacity. Increasing right hilar asymmetric density and opacity. No pleural effusion or pneumothorax IMPRESSION: 1. Increasing right hilar asymmetric density and opacity, mass lesion is a concern. Recommend correlation with contrast-enhanced chest CT. 2. Heterogeneous left lung base opacity, atelectasis versus pneumonia. Electronically Signed   By: Selena Batten  Jake Samples M.D.   On: 07/05/2023 20:59   DG Chest 2 View Result Date: 06/16/2023 CLINICAL DATA:  Shortness of breath EXAM: CHEST - 2 VIEW COMPARISON:  04/30/2023 x-ray FINDINGS: Enlarged cardiopericardial silhouette. No pneumothorax or effusion. There is again prominence of the interstitium and central vasculature with fullness in the right lung hilum and persistent nodular opacity at the left lung base. IMPRESSION: Persistent fullness of the right lung hilum. Component of enlarged pulmonary arteries with possible although separate lymph nodes or other lesion is in the differential. Enlarged cardiac silhouette. Left retrocardiac opacity.  Possible infiltrate. Recommend follow up Electronically Signed   By: Karen Kays M.D.   On: 06/16/2023 17:13     Assessment/Plan 1. ESRD on hemodialysis (HCC) (Primary) Recommend:  The patient is doing well and currently has adequate dialysis access.  Although there are some parameters suggesting possible future issues.  The patient's dialysis center is not reporting any major access issues currently.  At this time the patient has an adequate flow volume and no significant stenosis noted.   The initial referral was sent several months ago.  The patient will follow-up with me in the office in 3 months.  The need for a follow up duplex ultrasound will be made at that time based on whether problems with the access are persistent.    2. Essential hypertension, benign Continue antihypertensive medications as already ordered, these medications have been reviewed and there are no changes at this time.      Levora Dredge, MD  07/14/2023 7:53 AM

## 2023-07-15 ENCOUNTER — Encounter (INDEPENDENT_AMBULATORY_CARE_PROVIDER_SITE_OTHER): Payer: Self-pay | Admitting: Vascular Surgery

## 2023-07-15 ENCOUNTER — Ambulatory Visit (INDEPENDENT_AMBULATORY_CARE_PROVIDER_SITE_OTHER): Payer: MEDICAID

## 2023-07-15 ENCOUNTER — Ambulatory Visit (INDEPENDENT_AMBULATORY_CARE_PROVIDER_SITE_OTHER): Payer: MEDICAID | Admitting: Nurse Practitioner

## 2023-07-15 VITALS — BP 93/62 | HR 96 | Resp 18 | Ht 75.0 in | Wt 145.0 lb

## 2023-07-15 DIAGNOSIS — Z992 Dependence on renal dialysis: Secondary | ICD-10-CM

## 2023-07-15 DIAGNOSIS — I10 Essential (primary) hypertension: Secondary | ICD-10-CM

## 2023-07-15 DIAGNOSIS — J449 Chronic obstructive pulmonary disease, unspecified: Secondary | ICD-10-CM

## 2023-07-15 DIAGNOSIS — I25119 Atherosclerotic heart disease of native coronary artery with unspecified angina pectoris: Secondary | ICD-10-CM

## 2023-07-15 DIAGNOSIS — N186 End stage renal disease: Secondary | ICD-10-CM | POA: Diagnosis not present

## 2023-07-18 NOTE — Progress Notes (Incomplete)
MRN : [id number]  JORAH HUA is a 63 y.o. ([birth year]) male who presents with chief complaint of check access.  History of Present Illness:   The patient is unable to provide any history.  He has an attendant with him but she also provides very little history.  There is no redness or swelling at the access site.  Thrill is pulsatile   No recent shortening of the patient's walking distance or new symptoms consistent with claudication.  No history of rest pain symptoms. No new ulcers or wounds of the lower extremities have occurred.   The patient denies amaurosis fugax or recent TIA symptoms. There are no recent neurological changes noted. There is no history of DVT, PE or superficial thrombophlebitis. No recent episodes of angina or shortness of breath documented.    Duplex ultrasound of the AV access shows a patent access.    No outpatient medications have been marked as taking for the 07/15/23 encounter (Appointment) with Gilda Crease, Latina Craver, MD.    Past Medical History:  Diagnosis Date  . Anemia   . Arthritis   . Chronic respiratory failure (HCC)   . COPD (chronic obstructive pulmonary disease) (HCC)   . Dialysis patient [facility])    Mon. -Wed.- Fri  . DVT (deep venous thrombosis) (HCC)    cephalic and basolic vein thrombosis  . ESRD (end stage renal disease) (HCC)   . ESRD (end stage renal disease) on dialysis (HCC)   . Hyperlipidemia   . Hypertension   . Malignant hypertension   . Pneumonia 2016  . Renal artery stenosis (HCC)   . Schizophrenia [facility])     Past Surgical History:  Procedure Laterality Date  . A/V FISTULAGRAM N/A 08/06/2016   Procedure: A/V Fistulagram;  Surgeon: Annice Needy, MD;  Location: [facility] INVASIVE CV LAB;  Service: Cardiovascular;  Laterality: N/A;  . A/V FISTULAGRAM N/A 02/14/2021   Procedure: A/V FISTULAGRAM poss Perm Cath Insertion;  Surgeon: Renford Dills, MD;  Location: [facility] INVASIVE CV LAB;  Service:  Cardiovascular;  Laterality: N/A;  . A/V FISTULAGRAM Left 08/26/2021   Procedure: A/V Fistulagram;  Surgeon: Annice Needy, MD;  Location: [facility] INVASIVE CV LAB;  Service: Cardiovascular;  Laterality: Left;  . A/V SHUNT INTERVENTION N/A 08/06/2016   Procedure: A/V Shunt Intervention;  Surgeon: Annice Needy, MD;  Location: [facility] INVASIVE CV LAB;  Service: Cardiovascular;  Laterality: N/A;  . AV FISTULA PLACEMENT Left 12/26/2014   Procedure: ARTERIOVENOUS (AV) FISTULA CREATION;  Surgeon: Annice Needy, MD;  Location: [facility] ORS;  Service: Vascular;  Laterality: Left;  . AV FISTULA PLACEMENT    . ESOPHAGOGASTRODUODENOSCOPY (EGD) WITH PROPOFOL N/A 05/06/2017   Procedure: ESOPHAGOGASTRODUODENOSCOPY (EGD) WITH PROPOFOL;  Surgeon: Wyline Mood, MD;  Location: [facility] ENDOSCOPY;  Service: Gastroenterology;  Laterality: N/A;  . INSERTION OF DIALYSIS CATHETER Right   . PERIPHERAL VASCULAR CATHETERIZATION N/A 10/22/2014   Procedure: Dialysis/Perma Catheter Insertion;  Surgeon: Annice Needy, MD;  Location: [facility] INVASIVE CV LAB;  Service: Cardiovascular;  Laterality: N/A;  . PERIPHERAL VASCULAR CATHETERIZATION N/A 02/28/2015   Procedure: Dialysis/Perma Catheter Removal;  Surgeon: Annice Needy, MD;  Location: [facility] INVASIVE CV LAB;  Service: Cardiovascular;  Laterality: N/A;  . Repair fx left lower leg     yrs ago (age 46)  . REVISON OF ARTERIOVENOUS FISTULA Left 03/27/2022   Procedure: REVISON OF ARTERIOVENOUS FISTULA (JUMP  GRAFT);  Surgeon: Renford Dills, MD;  Location: [facility] ORS;  Service: Vascular;  Laterality: Left;  . TEE WITHOUT CARDIOVERSION N/A 09/21/2022   Procedure: TRANSESOPHAGEAL ECHOCARDIOGRAM;  Surgeon: Debbe Odea, MD;  Location: [facility] ORS;  Service: Cardiovascular;  Laterality: N/A;  . TONSILLECTOMY      Social History Social History   Tobacco Use  . Smoking status: Never    Passive exposure: Never  . Smokeless tobacco: Never  Vaping Use  . Vaping status: Never Used  Substance Use Topics   . Alcohol use: Not Currently  . Drug use: Not Currently    Types: Marijuana    Comment: not since living at assisted living place 03/2020    Family History Family History  Problem Relation Age of Onset  . Kidney disease Brother   . Diabetes Neg Hx     Allergies  Allergen Reactions  . Chlorpromazine Other (See Comments)    Reaction:  Unknown , pt states it makes him feel real bad Reaction:  Unknown , pt states it makes him feel real bad      REVIEW OF SYSTEMS (Negative unless checked)  Constitutional: [] Weight loss  [] Fever  [] Chills Cardiac: [] Chest pain   [] Chest pressure   [] Palpitations   [] Shortness of breath when laying flat   [] Shortness of breath with exertion. Vascular:  [] Pain in legs with walking   [] Pain in legs at rest  [] History of DVT   [] Phlebitis   [] Swelling in legs   [] Varicose veins   [] Non-healing ulcers Pulmonary:   [] Uses home oxygen   [] Productive cough   [] Hemoptysis   [] Wheeze  [] COPD   [] Asthma Neurologic:  [] Dizziness   [] Seizures   [] History of stroke   [] History of TIA  [] Aphasia   [] Vissual changes   [] Weakness or numbness in arm   [] Weakness or numbness in leg Musculoskeletal:   [] Joint swelling   [] Joint pain   [] Low back pain Hematologic:  [] Easy bruising  [] Easy bleeding   [] Hypercoagulable state   [] Anemic Gastrointestinal:  [] Diarrhea   [] Vomiting  [] Gastroesophageal reflux/heartburn   [] Difficulty swallowing. Genitourinary:  [x] Chronic kidney disease   [] Difficult urination  [] Frequent urination   [] Blood in urine Skin:  [] Rashes   [] Ulcers  Psychological:  [] History of anxiety   []  History of major depression.  Physical Examination  There were no vitals filed for this visit. There is no height or weight on file to calculate BMI. Gen: WD/WN, NAD Head: /AT, No temporalis wasting.  Ear/Nose/Throat: Hearing grossly intact, nares w/o erythema or drainage Eyes: PER, EOMI, sclera nonicteric.  Neck: Supple, no gross masses or lesions.  No  JVD.  Pulmonary:  Good air movement, no audible wheezing, no use of accessory muscles.  Cardiac: RRR, precordium non-hyperdynamic. Vascular:   *** Vessel Right Left  Radial Palpable Palpable  Brachial Palpable Palpable  Gastrointestinal: soft, non-distended. No guarding/no peritoneal signs.  Musculoskeletal: M/S 5/5 throughout.  No deformity.  Neurologic: CN 2-12 intact. Pain and light touch intact in extremities.  Symmetrical.  Speech is fluent. Motor exam as listed above. Psychiatric: Judgment intact, Mood & affect appropriate for pt's clinical situation. Dermatologic: No rashes or ulcers noted.  No changes consistent with cellulitis.   CBC Lab Results  Component Value Date   WBC 5.0 07/07/2023   HGB 12.1 (L) 07/07/2023   HCT 35.0 (L) 07/07/2023   MCV 102.3 (H) 07/07/2023   PLT 216 07/07/2023    BMET    Component Value Date/Time   NA  132 (L) 07/07/2023 1241   K 4.2 07/07/2023 1241   CL 93 (L) 07/07/2023 1241   CO2 25 07/07/2023 1241   GLUCOSE 157 (H) 07/07/2023 1241   BUN 35 (H) 07/07/2023 1241   CREATININE 7.06 (H) 07/07/2023 1241   CREATININE 3.47 (H) 03/23/2013 1501   CALCIUM 9.2 07/07/2023 1241   CALCIUM 8.1 (L) 10/22/2014 0504   GFRNONAA 8 (L) 07/07/2023 1241   GFRAA 7 (L) 10/23/2019 0405   Estimated Creatinine Clearance: 10.1 mL/min (A) (by C-G formula based on SCr of 7.06 mg/dL (H)).  COAG Lab Results  Component Value Date   INR 1.1 11/03/2022   INR 1.1 09/14/2022   INR 1.2 06/19/2022    Radiology CT Angio Chest PE W/Cm &/Or Wo Cm Result Date: 07/05/2023 CLINICAL DATA:  Cough with decreased lung sounds EXAM: CT ANGIOGRAPHY CHEST WITH CONTRAST TECHNIQUE: Multidetector CT imaging of the chest was performed using the standard protocol during bolus administration of intravenous contrast. Multiplanar CT image reconstructions and MIPs were obtained to evaluate the vascular anatomy. RADIATION DOSE REDUCTION: This exam was performed according to the departmental  dose-optimization program which includes automated exposure control, adjustment of the mA and/or kV according to patient size and/or use of iterative reconstruction technique. CONTRAST:  75mL OMNIPAQUE IOHEXOL 350 MG/ML SOLN COMPARISON:  11/03/2022 FINDINGS: Cardiovascular: Contrast injection is sufficient to demonstrate satisfactory opacification of the pulmonary arteries to the segmental level. There is no pulmonary embolus or evidence of right heart strain. The size of the main pulmonary artery is normal. Moderate cardiomegaly. No pericardial effusion. The course and caliber of the aorta are normal. There is no atherosclerotic calcification. Opacification decreased due to pulmonary arterial phase contrast bolus timing. Mediastinum/Nodes: No mediastinal, hilar or axillary lymphadenopathy. Normal visualized thyroid. Thoracic esophageal course is normal. Lungs/Pleura: There is a large area of consolidation of the right upper and lower lobe. Smaller left lower lobe consolidation. Small left pleural effusion. Upper Abdomen: Contrast bolus timing is not optimized for evaluation of the abdominal organs. Large volume upper abdominal ascites. Thickened appearance of both adrenal glands. Bilateral renal atrophy. Musculoskeletal: No chest wall abnormality. No bony spinal canal stenosis. Other: Bilateral gynecomastia. Review of the MIP images confirms the above findings. IMPRESSION: 1. No pulmonary embolus or evidence of right heart strain. 2. Large area of consolidation of the right upper and lower lobe, consistent with pneumonia. Follow-up imaging recommended after treatment to ensure resolution and exclude superimposed neoplasm. 3. Small left pleural effusion. 4. Large volume upper abdominal ascites. Aortic Atherosclerosis (ICD10-I70.0). Electronically Signed   By: Deatra Robinson M.D.   On: 07/05/2023 23:36   DG Chest Portable 1 View Result Date: 07/05/2023 CLINICAL DATA:  Chest pain EXAM: PORTABLE CHEST 1 VIEW  COMPARISON:  06/16/2023, 06/19/2022, chest CT 09/14/2022 FINDINGS: Stable enlarged cardiomediastinal silhouette. Heterogeneous left lung base opacity. Increasing right hilar asymmetric density and opacity. No pleural effusion or pneumothorax IMPRESSION: 1. Increasing right hilar asymmetric density and opacity, mass lesion is a concern. Recommend correlation with contrast-enhanced chest CT. 2. Heterogeneous left lung base opacity, atelectasis versus pneumonia. Electronically Signed   By: Jasmine Pang M.D.   On: 07/05/2023 20:59   DG Chest 2 View Result Date: 06/16/2023 CLINICAL DATA:  Shortness of breath EXAM: CHEST - 2 VIEW COMPARISON:  04/30/2023 x-ray FINDINGS: Enlarged cardiopericardial silhouette. No pneumothorax or effusion. There is again prominence of the interstitium and central vasculature with fullness in the right lung hilum and persistent nodular opacity at the left lung base. IMPRESSION:  Persistent fullness of the right lung hilum. Component of enlarged pulmonary arteries with possible although separate lymph nodes or other lesion is in the differential. Enlarged cardiac silhouette. Left retrocardiac opacity.  Possible infiltrate. Recommend follow up Electronically Signed   By: Karen Kays M.D.   On: 06/16/2023 17:13     Assessment/Plan 1. ESRD on hemodialysis (HCC) (Primary) Recommend:  The patient is doing well and currently has adequate dialysis access.  Although there are some parameters suggesting possible future issues.  The patient's dialysis center is not reporting any major access issues.  However, the flow rates in the patient's dialysis access are low, in the prethrombotic range, and there is no exact stenosis identified.  This raises concerns that the access is at moderate but not high risk for a problem or thrombosis and should be followed more closely  The patient will follow-up with me in the office in 3 months.  The need for a follow up duplex ultrasound will be made at  that time based on whether problems with the access are persistent.    2. Essential hypertension, benign Continue antihypertensive medications as already ordered, these medications have been reviewed and there are no changes at this time.      Levora Dredge, MD  07/14/2023 7:53 AM

## 2023-10-12 NOTE — Progress Notes (Deleted)
 MRN : [id number]  Todd Brown is a 63 y.o. ([birth year]) male who presents with chief complaint of check access.  History of Present Illness:   The patient is unable to provide any history.  He has an attendant with him but she also provides very little history.  There is no redness or swelling at the access site.  He had a referral sent for a whistling bruit however this referral was sent several months ago and they note that he has not been having any issues at dialysis   No recent shortening of the patient's walking distance or new symptoms consistent with claudication.  No history of rest pain symptoms. No new ulcers or wounds of the lower extremities have occurred.   The patient denies amaurosis fugax or recent TIA symptoms. There are no recent neurological changes noted. There is no history of DVT, PE or superficial thrombophlebitis. No recent episodes of angina or shortness of breath documented.    Duplex ultrasound of the AV access shows a patent access.  He has a flow volume of 1731.  There is a hematoma noted near the anastomosis does some pressure on the fistula but it does not cause any significant stenosis.  There is some stenosis noted near the confluence but it is mild  No outpatient medications have been marked as taking for the 10/14/23 encounter (Appointment) with Prescilla Brod, Ninette Basque, MD.    Past Medical History:  Diagnosis Date   Anemia    Arthritis    Chronic respiratory failure (HCC)    COPD (chronic obstructive pulmonary disease) (HCC)    Dialysis patient (HCC)    Mon. 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   ESRD (end stage renal disease) (HCC)    ESRD (end stage renal disease) on dialysis (HCC)    Hyperlipidemia    Hypertension    Malignant hypertension    Pneumonia 2016   Renal artery stenosis (HCC)    Schizophrenia (HCC)     Past Surgical History:  Procedure Laterality Date    A/V FISTULAGRAM N/A 08/06/2016   Procedure: A/V Fistulagram;  Surgeon: Celso College, MD;  Location: [facility] INVASIVE CV LAB;  Service: Cardiovascular;  Laterality: N/A;   A/V FISTULAGRAM N/A 02/14/2021   Procedure: A/V FISTULAGRAM poss Perm Cath Insertion;  Surgeon: Jackquelyn Mass, MD;  Location: [facility] INVASIVE CV LAB;  Service: Cardiovascular;  Laterality: N/A;   A/V FISTULAGRAM Left 08/26/2021   Procedure: A/V Fistulagram;  Surgeon: Celso College, MD;  Location: [facility] INVASIVE CV LAB;  Service: Cardiovascular;  Laterality: Left;   A/V SHUNT INTERVENTION N/A 08/06/2016   Procedure: A/V Shunt Intervention;  Surgeon: Celso College, MD;  Location: [facility] INVASIVE CV LAB;  Service: Cardiovascular;  Laterality: N/A;   AV FISTULA PLACEMENT Left 12/26/2014   Procedure: ARTERIOVENOUS (AV) FISTULA CREATION;  Surgeon: Celso College, MD;  Location: [facility] ORS;  Service: Vascular;  Laterality: Left;   AV FISTULA PLACEMENT     ESOPHAGOGASTRODUODENOSCOPY (EGD) WITH PROPOFOL  N/A 05/06/2017   Procedure: ESOPHAGOGASTRODUODENOSCOPY (EGD) WITH PROPOFOL ;  Surgeon: Luke Salaam, MD;  Location: [facility] ENDOSCOPY;  Service: Gastroenterology;  Laterality: N/A;   INSERTION OF DIALYSIS CATHETER Right    PERIPHERAL VASCULAR CATHETERIZATION N/A 10/22/2014   Procedure: Dialysis/Perma Catheter Insertion;  Surgeon: Celso College, MD;  Location: [facility] INVASIVE CV  LAB;  Service: Cardiovascular;  Laterality: N/A;   PERIPHERAL VASCULAR CATHETERIZATION N/A 02/28/2015   Procedure: Dialysis/Perma Catheter Removal;  Surgeon: Celso College, MD;  Location: [facility] INVASIVE CV LAB;  Service: Cardiovascular;  Laterality: N/A;   Repair fx left lower leg     yrs ago (age 37)   REVISON OF ARTERIOVENOUS FISTULA Left 03/27/2022   Procedure: REVISON OF ARTERIOVENOUS FISTULA (JUMP GRAFT);  Surgeon: Jackquelyn Mass, MD;  Location: [facility] ORS;  Service: Vascular;  Laterality: Left;   TEE WITHOUT CARDIOVERSION N/A 09/21/2022   Procedure: TRANSESOPHAGEAL  ECHOCARDIOGRAM;  Surgeon: Constancia Delton, MD;  Location: [facility] ORS;  Service: Cardiovascular;  Laterality: N/A;   TONSILLECTOMY      Social History Social History   Tobacco Use   Smoking status: Never    Passive exposure: Never   Smokeless tobacco: Never  Vaping Use   Vaping status: Never Used  Substance Use Topics   Alcohol use: Not Currently   Drug use: Not Currently    Types: Marijuana    Comment: not since living at assisted living place 03/2020    Family History Family History  Problem Relation Age of Onset   Kidney disease Brother    Diabetes Neg Hx     Allergies  Allergen Reactions   Chlorpromazine Other (See Comments)    Reaction:  Unknown , pt states it makes him feel real bad Reaction:  Unknown , pt states it makes him feel real bad      REVIEW OF SYSTEMS (Negative unless checked)  Constitutional: [] Weight loss  [] Fever  [] Chills Cardiac: [] Chest pain   [] Chest pressure   [] Palpitations   [] Shortness of breath when laying flat   [] Shortness of breath with exertion. Vascular:  [] Pain in legs with walking   [] Pain in legs at rest  [] History of DVT   [] Phlebitis   [] Swelling in legs   [] Varicose veins   [] Non-healing ulcers Pulmonary:   [] Uses home oxygen   [] Productive cough   [] Hemoptysis   [] Wheeze  [] COPD   [] Asthma Neurologic:  [] Dizziness   [] Seizures   [] History of stroke   [] History of TIA  [] Aphasia   [] Vissual changes   [] Weakness or numbness in arm   [] Weakness or numbness in leg Musculoskeletal:   [] Joint swelling   [] Joint pain   [] Low back pain Hematologic:  [] Easy bruising  [] Easy bleeding   [] Hypercoagulable state   [] Anemic Gastrointestinal:  [] Diarrhea   [] Vomiting  [] Gastroesophageal reflux/heartburn   [] Difficulty swallowing. Genitourinary:  [x] Chronic kidney disease   [] Difficult urination  [] Frequent urination   [] Blood in urine Skin:  [] Rashes   [] Ulcers  Psychological:  [] History of anxiety   []  History of major depression.  Physical  Examination  There were no vitals filed for this visit. There is no height or weight on file to calculate BMI. Gen: WD/WN, NAD Head: [place]/AT, No temporalis wasting.  Ear/Nose/Throat: Hearing grossly intact, nares w/o erythema or drainage Eyes: PER, EOMI, sclera nonicteric.  Neck: Supple, no gross masses or lesions.  No JVD.  Pulmonary:  Good air movement, no audible wheezing, no use of accessory muscles.  Cardiac: RRR, precordium non-hyperdynamic. Vascular:   *** Vessel Right Left  Radial Palpable Palpable  Brachial Palpable Palpable  Gastrointestinal: soft, non-distended. No guarding/no peritoneal signs.  Musculoskeletal: M/S 5/5 throughout.  No deformity.  Neurologic: CN 2-12 intact. Pain and light touch intact in extremities.  Symmetrical.  Speech is fluent. Motor exam as listed above. Psychiatric: Judgment intact, Mood &  affect appropriate for pt's clinical situation. Dermatologic: No rashes or ulcers noted.  No changes consistent with cellulitis.   CBC Lab Results  Component Value Date   WBC 5.0 07/07/2023   HGB 12.1 (L) 07/07/2023   HCT 35.0 (L) 07/07/2023   MCV 102.3 (H) 07/07/2023   PLT 216 07/07/2023    BMET    Component Value Date/Time   NA 132 (L) 07/07/2023 1241   K 4.2 07/07/2023 1241   CL 93 (L) 07/07/2023 1241   CO2 25 07/07/2023 1241   GLUCOSE 157 (H) 07/07/2023 1241   BUN 35 (H) 07/07/2023 1241   CREATININE 7.06 (H) 07/07/2023 1241   CREATININE 3.47 (H) 03/23/2013 1501   CALCIUM  9.2 07/07/2023 1241   CALCIUM  8.1 (L) 10/22/2014 0504   GFRNONAA 8 (L) 07/07/2023 1241   GFRAA 7 (L) 10/23/2019 0405   CrCl cannot be calculated (Patient's most recent lab result is older than the maximum 21 days allowed.).  COAG Lab Results  Component Value Date   INR 1.1 11/03/2022   INR 1.1 09/14/2022   INR 1.2 06/19/2022    Radiology No results found.   Assessment/Plan There are no diagnoses linked to this encounter.   Devon Fogo, MD  10/12/2023 8:15  PM

## 2023-10-13 ENCOUNTER — Other Ambulatory Visit (INDEPENDENT_AMBULATORY_CARE_PROVIDER_SITE_OTHER): Payer: Self-pay | Admitting: Nurse Practitioner

## 2023-10-13 ENCOUNTER — Other Ambulatory Visit: Payer: Self-pay

## 2023-10-13 ENCOUNTER — Inpatient Hospital Stay
Admission: EM | Admit: 2023-10-13 | Discharge: 2023-10-19 | DRG: 193 | Disposition: A | Discharge status: Home / Self Care | Payer: MEDICAID | Attending: Family Medicine | Admitting: Family Medicine

## 2023-10-13 ENCOUNTER — Inpatient Hospital Stay: Payer: MEDICAID

## 2023-10-13 ENCOUNTER — Emergency Department: Payer: MEDICAID

## 2023-10-13 ENCOUNTER — Encounter: Payer: Self-pay | Admitting: Emergency Medicine

## 2023-10-13 DIAGNOSIS — F209 Schizophrenia, unspecified: Secondary | ICD-10-CM | POA: Diagnosis present

## 2023-10-13 DIAGNOSIS — E785 Hyperlipidemia, unspecified: Secondary | ICD-10-CM | POA: Diagnosis not present

## 2023-10-13 DIAGNOSIS — C3411 Malignant neoplasm of upper lobe, right bronchus or lung: Secondary | ICD-10-CM | POA: Diagnosis present

## 2023-10-13 DIAGNOSIS — I700 Atherosclerosis of aorta: Secondary | ICD-10-CM | POA: Diagnosis present

## 2023-10-13 DIAGNOSIS — B182 Chronic viral hepatitis C: Secondary | ICD-10-CM | POA: Diagnosis present

## 2023-10-13 DIAGNOSIS — Z5982 Transportation insecurity: Secondary | ICD-10-CM

## 2023-10-13 DIAGNOSIS — E875 Hyperkalemia: Secondary | ICD-10-CM | POA: Diagnosis present

## 2023-10-13 DIAGNOSIS — D631 Anemia in chronic kidney disease: Secondary | ICD-10-CM | POA: Diagnosis present

## 2023-10-13 DIAGNOSIS — D3502 Benign neoplasm of left adrenal gland: Secondary | ICD-10-CM | POA: Diagnosis present

## 2023-10-13 DIAGNOSIS — I2510 Atherosclerotic heart disease of native coronary artery without angina pectoris: Secondary | ICD-10-CM | POA: Diagnosis present

## 2023-10-13 DIAGNOSIS — Z66 Do not resuscitate: Secondary | ICD-10-CM | POA: Diagnosis not present

## 2023-10-13 DIAGNOSIS — Z515 Encounter for palliative care: Secondary | ICD-10-CM

## 2023-10-13 DIAGNOSIS — R109 Unspecified abdominal pain: Secondary | ICD-10-CM | POA: Diagnosis not present

## 2023-10-13 DIAGNOSIS — Z8701 Personal history of pneumonia (recurrent): Secondary | ICD-10-CM

## 2023-10-13 DIAGNOSIS — Z9981 Dependence on supplemental oxygen: Secondary | ICD-10-CM | POA: Diagnosis not present

## 2023-10-13 DIAGNOSIS — I10 Essential (primary) hypertension: Secondary | ICD-10-CM | POA: Diagnosis present

## 2023-10-13 DIAGNOSIS — J449 Chronic obstructive pulmonary disease, unspecified: Secondary | ICD-10-CM | POA: Diagnosis present

## 2023-10-13 DIAGNOSIS — N2581 Secondary hyperparathyroidism of renal origin: Secondary | ICD-10-CM | POA: Diagnosis present

## 2023-10-13 DIAGNOSIS — I25119 Atherosclerotic heart disease of native coronary artery with unspecified angina pectoris: Secondary | ICD-10-CM | POA: Diagnosis not present

## 2023-10-13 DIAGNOSIS — Z8674 Personal history of sudden cardiac arrest: Secondary | ICD-10-CM

## 2023-10-13 DIAGNOSIS — R0600 Dyspnea, unspecified: Principal | ICD-10-CM

## 2023-10-13 DIAGNOSIS — Z992 Dependence on renal dialysis: Secondary | ICD-10-CM | POA: Diagnosis not present

## 2023-10-13 DIAGNOSIS — Z86718 Personal history of other venous thrombosis and embolism: Secondary | ICD-10-CM

## 2023-10-13 DIAGNOSIS — J9621 Acute and chronic respiratory failure with hypoxia: Secondary | ICD-10-CM | POA: Diagnosis present

## 2023-10-13 DIAGNOSIS — I120 Hypertensive chronic kidney disease with stage 5 chronic kidney disease or end stage renal disease: Secondary | ICD-10-CM | POA: Diagnosis present

## 2023-10-13 DIAGNOSIS — B1920 Unspecified viral hepatitis C without hepatic coma: Secondary | ICD-10-CM | POA: Diagnosis present

## 2023-10-13 DIAGNOSIS — J189 Pneumonia, unspecified organism: Secondary | ICD-10-CM | POA: Diagnosis present

## 2023-10-13 DIAGNOSIS — J440 Chronic obstructive pulmonary disease with acute lower respiratory infection: Secondary | ICD-10-CM | POA: Diagnosis present

## 2023-10-13 DIAGNOSIS — R918 Other nonspecific abnormal finding of lung field: Secondary | ICD-10-CM | POA: Diagnosis present

## 2023-10-13 DIAGNOSIS — I3139 Other pericardial effusion (noninflammatory): Secondary | ICD-10-CM | POA: Diagnosis present

## 2023-10-13 DIAGNOSIS — R188 Other ascites: Secondary | ICD-10-CM | POA: Diagnosis present

## 2023-10-13 DIAGNOSIS — Z79899 Other long term (current) drug therapy: Secondary | ICD-10-CM

## 2023-10-13 DIAGNOSIS — Z79890 Hormone replacement therapy (postmenopausal): Secondary | ICD-10-CM

## 2023-10-13 DIAGNOSIS — Z1152 Encounter for screening for COVID-19: Secondary | ICD-10-CM

## 2023-10-13 DIAGNOSIS — Z841 Family history of disorders of kidney and ureter: Secondary | ICD-10-CM

## 2023-10-13 DIAGNOSIS — H543 Unqualified visual loss, both eyes: Secondary | ICD-10-CM | POA: Diagnosis not present

## 2023-10-13 DIAGNOSIS — E039 Hypothyroidism, unspecified: Secondary | ICD-10-CM | POA: Diagnosis present

## 2023-10-13 DIAGNOSIS — N186 End stage renal disease: Secondary | ICD-10-CM

## 2023-10-13 DIAGNOSIS — J90 Pleural effusion, not elsewhere classified: Secondary | ICD-10-CM | POA: Diagnosis present

## 2023-10-13 DIAGNOSIS — E1122 Type 2 diabetes mellitus with diabetic chronic kidney disease: Secondary | ICD-10-CM | POA: Diagnosis present

## 2023-10-13 DIAGNOSIS — D3500 Benign neoplasm of unspecified adrenal gland: Secondary | ICD-10-CM | POA: Diagnosis present

## 2023-10-13 DIAGNOSIS — Z882 Allergy status to sulfonamides status: Secondary | ICD-10-CM

## 2023-10-13 DIAGNOSIS — K219 Gastro-esophageal reflux disease without esophagitis: Secondary | ICD-10-CM | POA: Diagnosis present

## 2023-10-13 LAB — CBC
HCT: 41.9 % (ref 39.0–52.0)
Hemoglobin: 13.9 g/dL (ref 13.0–17.0)
MCH: 33.9 pg (ref 26.0–34.0)
MCHC: 33.2 g/dL (ref 30.0–36.0)
MCV: 102.2 fL — ABNORMAL HIGH (ref 80.0–100.0)
Platelets: 223 10*3/uL (ref 150–400)
RBC: 4.1 MIL/uL — ABNORMAL LOW (ref 4.22–5.81)
RDW: 14 % (ref 11.5–15.5)
WBC: 6.3 10*3/uL (ref 4.0–10.5)
nRBC: 0 % (ref 0.0–0.2)

## 2023-10-13 LAB — COMPREHENSIVE METABOLIC PANEL WITH GFR
ALT: 9 U/L (ref 0–44)
AST: 27 U/L (ref 15–41)
Albumin: 3.2 g/dL — ABNORMAL LOW (ref 3.5–5.0)
Alkaline Phosphatase: 51 U/L (ref 38–126)
Anion gap: 14 (ref 5–15)
BUN: 39 mg/dL — ABNORMAL HIGH (ref 8–23)
CO2: 26 mmol/L (ref 22–32)
Calcium: 9.3 mg/dL (ref 8.9–10.3)
Chloride: 95 mmol/L — ABNORMAL LOW (ref 98–111)
Creatinine, Ser: 7.61 mg/dL — ABNORMAL HIGH (ref 0.61–1.24)
GFR, Estimated: 7 mL/min — ABNORMAL LOW (ref >60)
Glucose, Bld: 97 mg/dL (ref 70–99)
Potassium: 4 mmol/L (ref 3.5–5.1)
Sodium: 135 mmol/L (ref 135–145)
Total Bilirubin: 0.9 mg/dL (ref 0.0–1.2)
Total Protein: 7.1 g/dL (ref 6.5–8.1)

## 2023-10-13 LAB — LIPASE, BLOOD: Lipase: 26 U/L (ref 11–51)

## 2023-10-13 LAB — AMMONIA: Ammonia: <13 umol/L (ref 9–35)

## 2023-10-13 LAB — LACTATE DEHYDROGENASE: LDH: 182 U/L (ref 98–192)

## 2023-10-13 MED ORDER — PIPERACILLIN-TAZOBACTAM IN DEX 2-0.25 GM/50ML IV SOLN
2.2500 g | Freq: Three times a day (TID) | INTRAVENOUS | Status: DC
  Administered 2023-10-13 – 2023-10-16 (×7): 2.25 g via INTRAVENOUS
  Filled 2023-10-13 (×10): qty 50

## 2023-10-13 MED ORDER — POLYETHYLENE GLYCOL 3350 17 G PO PACK: 17.0000 g | PACK | Freq: Every day | ORAL | Status: DC | PRN

## 2023-10-13 MED ORDER — ZINC SULFATE 220 (50 ZN) MG PO CAPS
220.0000 mg | ORAL_CAPSULE | Freq: Every day | ORAL | Status: DC
  Administered 2023-10-14 – 2023-10-19 (×6): 220 mg via ORAL
  Filled 2023-10-13 (×6): qty 1

## 2023-10-13 MED ORDER — PANTOPRAZOLE SODIUM 40 MG PO TBEC
40.0000 mg | DELAYED_RELEASE_TABLET | Freq: Every day | ORAL | Status: DC
  Administered 2023-10-14 – 2023-10-19 (×6): 40 mg via ORAL
  Filled 2023-10-13 (×6): qty 1

## 2023-10-13 MED ORDER — SODIUM CHLORIDE 0.9 % IV SOLN: 1.0000 g | Freq: Once | INTRAVENOUS | Status: DC

## 2023-10-13 MED ORDER — IPRATROPIUM-ALBUTEROL 0.5-2.5 (3) MG/3ML IN SOLN
3.0000 mL | Freq: Three times a day (TID) | RESPIRATORY_TRACT | Status: DC
  Administered 2023-10-14 – 2023-10-16 (×3): 3 mL via RESPIRATORY_TRACT
  Filled 2023-10-13 (×6): qty 3

## 2023-10-13 MED ORDER — IPRATROPIUM-ALBUTEROL 0.5-2.5 (3) MG/3ML IN SOLN
3.0000 mL | Freq: Once | RESPIRATORY_TRACT | Status: AC
  Administered 2023-10-13: 3 mL via RESPIRATORY_TRACT
  Filled 2023-10-13: qty 3

## 2023-10-13 MED ORDER — LORAZEPAM 0.5 MG PO TABS
0.5000 mg | ORAL_TABLET | ORAL | Status: DC | PRN
  Administered 2023-10-14 – 2023-10-17 (×4): 0.5 mg via ORAL
  Filled 2023-10-13 (×4): qty 1

## 2023-10-13 MED ORDER — ALBUMIN HUMAN 25 % IV SOLN
12.5000 g | Freq: Once | INTRAVENOUS | Status: AC
  Administered 2023-10-14: 12.5 g via INTRAVENOUS
  Filled 2023-10-13: qty 50

## 2023-10-13 MED ORDER — ALBUTEROL SULFATE HFA 108 (90 BASE) MCG/ACT IN AERS: 2.0000 | INHALATION_SPRAY | Freq: Four times a day (QID) | RESPIRATORY_TRACT | Status: DC | PRN

## 2023-10-13 MED ORDER — MORPHINE SULFATE (PF) 2 MG/ML IV SOLN
1.0000 mg | INTRAVENOUS | Status: DC | PRN
  Administered 2023-10-14 – 2023-10-15 (×2): 1 mg via INTRAVENOUS
  Filled 2023-10-13 (×2): qty 1

## 2023-10-13 MED ORDER — VANCOMYCIN HCL 1500 MG/300ML IV SOLN
1500.0000 mg | Freq: Once | INTRAVENOUS | Status: DC
  Filled 2023-10-13: qty 300

## 2023-10-13 MED ORDER — TRAMADOL HCL 50 MG PO TABS: 50.0000 mg | ORAL_TABLET | Freq: Four times a day (QID) | ORAL | Status: DC | PRN

## 2023-10-13 MED ORDER — TRIHEXYPHENIDYL HCL 2 MG PO TABS
2.0000 mg | ORAL_TABLET | Freq: Two times a day (BID) | ORAL | Status: DC
  Administered 2023-10-14 – 2023-10-19 (×11): 2 mg via ORAL
  Filled 2023-10-13 (×12): qty 1

## 2023-10-13 MED ORDER — HYDRALAZINE HCL 20 MG/ML IJ SOLN: 5.0000 mg | INTRAMUSCULAR | Status: DC | PRN

## 2023-10-13 MED ORDER — SODIUM CHLORIDE 0.9 % IV SOLN: 500.0000 mg | Freq: Once | INTRAVENOUS | Status: DC

## 2023-10-13 MED ORDER — ACETAMINOPHEN 325 MG PO TABS: 650.0000 mg | ORAL_TABLET | Freq: Four times a day (QID) | ORAL | Status: DC | PRN

## 2023-10-13 MED ORDER — VANCOMYCIN VARIABLE DOSE PER UNSTABLE RENAL FUNCTION (PHARMACIST DOSING): Status: DC

## 2023-10-13 MED ORDER — IPRATROPIUM-ALBUTEROL 0.5-2.5 (3) MG/3ML IN SOLN
3.0000 mL | RESPIRATORY_TRACT | Status: DC
  Administered 2023-10-13 (×2): 3 mL via RESPIRATORY_TRACT
  Filled 2023-10-13: qty 3

## 2023-10-13 MED ORDER — VANCOMYCIN HCL 1500 MG/300ML IV SOLN
1500.0000 mg | Freq: Once | INTRAVENOUS | Status: AC
  Administered 2023-10-13: 1500 mg via INTRAVENOUS
  Filled 2023-10-13: qty 300

## 2023-10-13 MED ORDER — ONDANSETRON HCL 4 MG/2ML IJ SOLN
4.0000 mg | Freq: Once | INTRAMUSCULAR | Status: AC
  Administered 2023-10-13: 4 mg via INTRAVENOUS
  Filled 2023-10-13: qty 2

## 2023-10-13 MED ORDER — ROSUVASTATIN CALCIUM 10 MG PO TABS
10.0000 mg | ORAL_TABLET | Freq: Every day | ORAL | Status: DC
  Administered 2023-10-13 – 2023-10-18 (×6): 10 mg via ORAL
  Filled 2023-10-13 (×7): qty 1

## 2023-10-13 MED ORDER — ALBUTEROL SULFATE (2.5 MG/3ML) 0.083% IN NEBU
2.5000 mg | INHALATION_SOLUTION | RESPIRATORY_TRACT | Status: DC | PRN
  Administered 2023-10-15 – 2023-10-18 (×8): 2.5 mg via RESPIRATORY_TRACT
  Filled 2023-10-13 (×8): qty 3

## 2023-10-13 MED ORDER — ONDANSETRON HCL 4 MG/2ML IJ SOLN: 4.0000 mg | Freq: Three times a day (TID) | INTRAMUSCULAR | Status: DC | PRN

## 2023-10-13 MED ORDER — LEVOTHYROXINE SODIUM 50 MCG PO TABS
125.0000 ug | ORAL_TABLET | Freq: Every day | ORAL | Status: DC
  Administered 2023-10-14 – 2023-10-19 (×6): 125 ug via ORAL
  Filled 2023-10-13 (×6): qty 1

## 2023-10-13 MED ORDER — RENA-VITE PO TABS
1.0000 | ORAL_TABLET | Freq: Every day | ORAL | Status: DC
  Administered 2023-10-14 – 2023-10-18 (×5): 1 via ORAL
  Filled 2023-10-13 (×6): qty 1

## 2023-10-13 MED ORDER — HYOSCYAMINE SULFATE 0.125 MG SL SUBL: 0.1250 mg | SUBLINGUAL_TABLET | SUBLINGUAL | Status: DC | PRN

## 2023-10-13 MED ORDER — FLUTICASONE FUROATE-VILANTEROL 200-25 MCG/ACT IN AEPB
1.0000 | INHALATION_SPRAY | Freq: Every day | RESPIRATORY_TRACT | Status: DC
  Administered 2023-10-14 – 2023-10-19 (×6): 1 via RESPIRATORY_TRACT
  Filled 2023-10-13: qty 28

## 2023-10-13 MED ORDER — FERROUS SULFATE 325 (65 FE) MG PO TABS
325.0000 mg | ORAL_TABLET | Freq: Every day | ORAL | Status: DC
  Administered 2023-10-14 – 2023-10-19 (×6): 325 mg via ORAL
  Filled 2023-10-13 (×6): qty 1

## 2023-10-13 MED ORDER — VITAMIN D 25 MCG (1000 UNIT) PO TABS
1000.0000 [IU] | ORAL_TABLET | Freq: Every day | ORAL | Status: DC
  Administered 2023-10-14 – 2023-10-19 (×6): 1000 [IU] via ORAL
  Filled 2023-10-13 (×6): qty 1

## 2023-10-13 MED ORDER — VITAMIN C 500 MG PO TABS
500.0000 mg | ORAL_TABLET | Freq: Every day | ORAL | Status: DC
  Administered 2023-10-14 – 2023-10-19 (×6): 500 mg via ORAL
  Filled 2023-10-13 (×6): qty 1

## 2023-10-13 MED ORDER — DM-GUAIFENESIN ER 30-600 MG PO TB12
1.0000 | ORAL_TABLET | Freq: Two times a day (BID) | ORAL | Status: DC | PRN
  Filled 2023-10-13: qty 1

## 2023-10-13 MED ORDER — CARBAMAZEPINE 200 MG PO TABS
1000.0000 mg | ORAL_TABLET | Freq: Every morning | ORAL | Status: DC
  Administered 2023-10-14 – 2023-10-19 (×6): 1000 mg via ORAL
  Filled 2023-10-13 (×6): qty 5

## 2023-10-13 MED ORDER — HEPARIN SODIUM (PORCINE) 5000 UNIT/ML IJ SOLN
5000.0000 [IU] | Freq: Three times a day (TID) | INTRAMUSCULAR | Status: DC
  Administered 2023-10-13 – 2023-10-19 (×15): 5000 [IU] via SUBCUTANEOUS
  Filled 2023-10-13 (×15): qty 1

## 2023-10-13 NOTE — ED Triage Notes (Signed)
 Pt reports shortness of breath, abdominal pain, and that he hasn't had his daily dialysis today.  States he received dialysis Monday but unsure if he received a full treatment.  Pt on 2L [place].  96% O2 saturation.

## 2023-10-13 NOTE — Progress Notes (Signed)
 Pharmacy Antibiotic Note  Todd Brown is a 63 y.o. male admitted on 10/13/2023 with Postobstructive pneumonia, possible aspiration pneumonia.  Pharmacy has been consulted for vancomycin  and Zosyn dosing.  Plan: Patient with ESRD on HD MWF outpatient, inpatient plan not established yet Give vancomycin  1500 mg x 1. Goal trough 15-25 mcg/mL Vancomycin  regimen pending HD plans Plan to check vancomycin  level prior to 4th maintenance dose  Start Zosyn 2.25 g IV Q8H Continue to monitor culture results Follow-up MRSA PCR for possible de-escalation       Temp (24hrs), Avg:98.4 F (36.9 C), Min:98.4 F (36.9 C), Max:98.4 F (36.9 C)  Recent Labs  Lab 10/13/23 1620  WBC 6.3  CREATININE 7.61*    CrCl cannot be calculated (Unknown ideal weight.).    Allergies  Allergen Reactions   Chlorpromazine Other (See Comments)    Reaction:  Unknown , pt states it makes him feel real bad Reaction:  Unknown , pt states it makes him feel real bad     Antimicrobials this admission: 5/14 Vanc >>  5/14 Zosyn >>   Microbiology results: 5/14 BCx: to be collected 5/14 MRSA PCR: ordered   Thank you for allowing pharmacy to be a part of this patient's care.  Alice Innocent, PharmD Clinical Pharmacist  10/13/2023 8:17 PM

## 2023-10-13 NOTE — H&P (Addendum)
 History and Physical    Todd Brown AVW:[id number] DOB: 08/19/1960 DOA: 10/13/2023  Referring MD/NP/PA:   PCP: Janann Meadow, NP   Patient coming from:  The patient is coming from group home.     Chief Complaint: SOB, nausea, vomiting, abdominal pain, abdominal distention  HPI: Todd Brown is a 63 y.o. male with medical history significant of f ESRD-HD (MWF), schizophrenia, HTN, HLD, COPD on 2 L oxygen, CAD, hypothyroidism, GERD, remote right leg DVT 2018 not on anticoagulants currently, cardiac arrest, strep bacteremia with negative TEE, blindness, presents with shortness of breath, nausea, vomiting, abdominal pain, abdominal distention.  Patient states he has shortness of breath since yesterday, which has been progressively worsening.  He has cough with little mucus production, denies chest pain, no fever or chills. He reports nausea, vomiting, abdominal pain, no diarrhea.  Last bowel movement was yesterday.  He has several episode of nonbilious nonbloody vomiting.  He has abdominal distention with diffuse abdominal tenderness on palpation.  Denies symptoms of UTI.  His last dialysis was on Monday, he did not do dialysis today.  Patient is normally on 2 L oxygen, but due to shortness of breath, patient was put on 4L of oxygen with 93% saturation in ED.   Data reviewed independently and ED Course: pt was found to have WBC 6.3, potassium 4.0, bicarbonate 26, creatinine 7.61, BUN 39, temperature 98.4, blood pressure 89/63 --> 105/65, heart rate 74, RR 25--> 20.  Patient is admitted to telemetry bed as inpatient   Chest x-ray: *Parenchymal consolidative opacity of the right lung involving particularly right mid and right lower lobe region correlate with pneumonia. The possibility of underlying mass is not excluded due to the round lobular appearance. *Ill-defined left lower lobe infiltrates and atelectasis. Correlation with CT recommended.   44M, hx of ESRD-HD (MWF),  schizophrenia, HTN, HLD, COPD on 2 L oxygen, CAD, hypothyroidism, GERD, remote right leg DVT 2018 not on anticoagulants currently, cardiac arrest, strep bacteremia with negative TEE, blindness, admitted due to postobstructive pneumonia. CT-chest showed right lung masslike opacity, concerning for malignancy. Also has abdominal pain and abdominal distention, pending CT scan of abdomen/pelvis to rule out small bowel obstruction.  CT of chest: 1. Since the prior study, there is interval increase in the masslike opacity in the right lung, which is concerning for malignancy. There are new heterogeneous opacities in the right lung lower lobe, which may represent postobstructive pneumonia. Pulmonary consultation and further evaluation with contrast-enhanced chest CT scan is recommended. 2. There is also interval increase in the size of left lung lower lobe nodule. 3. There is interval increase in bilateral pleural effusions. 4. There is interval increase in the size of right lower paratracheal lymph node, which may be reactive versus metastatic. 5. Multiple other nonacute observations, as described above.   Aortic Atherosclerosis (ICD10-I70.0) and Emphysema (ICD10-J43.9   EKG: I have personally reviewed.  Sinus rhythm, QTc 484, and stable baseline, early R wave progression, RAD   Review of Systems:   General: no fevers, chills, no body weight gain, has fatigue HEENT: hearing changes or sore throat. Has blindness Respiratory: has dyspnea, coughing, no wheezing CV: no chest pain, no palpitations GI: has nausea, vomiting, abdominal pain, abdominal distention GU: no dysuria, burning on urination, increased urinary frequency, hematuria  Ext: no leg edema Neuro: no unilateral weakness, numbness, or tingling, no vision change or hearing loss Skin: no rash, no skin tear. MSK: No muscle spasm, no deformity, no limitation of range of  movement in spin Heme: No easy bruising.  Travel history: No  recent long distant travel.   Allergy:  Allergies  Allergen Reactions   Chlorpromazine Other (See Comments)    Reaction:  Unknown , pt states it makes him feel real bad Reaction:  Unknown , pt states it makes him feel real bad     Past Medical History:  Diagnosis Date   Anemia    Arthritis    Chronic respiratory failure (HCC)    COPD (chronic obstructive pulmonary disease) (HCC)    Dialysis patient (HCC)    Mon. 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   ESRD (end stage renal disease) (HCC)    ESRD (end stage renal disease) on dialysis (HCC)    Hyperlipidemia    Hypertension    Malignant hypertension    Pneumonia 2016   Renal artery stenosis (HCC)    Schizophrenia (HCC)     Past Surgical History:  Procedure Laterality Date   A/V FISTULAGRAM N/A 08/06/2016   Procedure: A/V Fistulagram;  Surgeon: Celso College, MD;  Location: [facility];  Service: Cardiovascular;  Laterality: N/A;   A/V FISTULAGRAM N/A 02/14/2021   Procedure: A/V FISTULAGRAM poss Perm Cath Insertion;  Surgeon: Jackquelyn Mass, MD;  Location: [facility];  Service: Cardiovascular;  Laterality: N/A;   A/V FISTULAGRAM Left 08/26/2021   Procedure: A/V Fistulagram;  Surgeon: Celso College, MD;  Location: [facility];  Service: Cardiovascular;  Laterality: Left;   A/V SHUNT INTERVENTION N/A 08/06/2016   Procedure: A/V Shunt Intervention;  Surgeon: Celso College, MD;  Location: [facility];  Service: Cardiovascular;  Laterality: N/A;   AV FISTULA PLACEMENT Left 12/26/2014   Procedure: ARTERIOVENOUS (AV) FISTULA CREATION;  Surgeon: Celso College, MD;  Location: [facility];  Service: Vascular;  Laterality: Left;   AV FISTULA PLACEMENT     ESOPHAGOGASTRODUODENOSCOPY (EGD) WITH PROPOFOL  N/A 05/06/2017   Procedure: ESOPHAGOGASTRODUODENOSCOPY (EGD) WITH PROPOFOL ;  Surgeon: Luke Salaam, MD;  Location: [facility];  Service: Gastroenterology;   Laterality: N/A;   INSERTION OF DIALYSIS CATHETER Right    PERIPHERAL VASCULAR CATHETERIZATION N/A 10/22/2014   Procedure: Dialysis/Perma Catheter Insertion;  Surgeon: Celso College, MD;  Location: [facility];  Service: Cardiovascular;  Laterality: N/A;   PERIPHERAL VASCULAR CATHETERIZATION N/A 02/28/2015   Procedure: Dialysis/Perma Catheter Removal;  Surgeon: Celso College, MD;  Location: [facility];  Service: Cardiovascular;  Laterality: N/A;   Repair fx left lower leg     yrs ago (age 85)   REVISON OF ARTERIOVENOUS FISTULA Left 03/27/2022   Procedure: REVISON OF ARTERIOVENOUS FISTULA (JUMP GRAFT);  Surgeon: Jackquelyn Mass, MD;  Location: [facility];  Service: Vascular;  Laterality: Left;   TEE WITHOUT CARDIOVERSION N/A 09/21/2022   Procedure: TRANSESOPHAGEAL ECHOCARDIOGRAM;  Surgeon: Constancia Delton, MD;  Location: [facility];  Service: Cardiovascular;  Laterality: N/A;   TONSILLECTOMY      Social History:  reports that he has never smoked. He has never been exposed to tobacco smoke. He has never used smokeless tobacco. He reports that he does not currently use alcohol. He reports that he does not currently use drugs after having used the following drugs: Marijuana.  Family History:  Family History  Problem Relation Age of Onset   Kidney disease Brother    Diabetes Neg Hx      Prior to Admission medications  Medication Sig Start Date End Date Taking? Authorizing Provider  albuterol  (VENTOLIN  HFA) 108 (90 Base) MCG/ACT inhaler Inhale 1-2 puffs into the lungs every 4 (four) hours as needed for wheezing or shortness of breath.    [provider]  b complex-vitamin c -folic acid  (NEPHRO-VITE) 0.8 MG TABS tablet Take 1 tablet by mouth at bedtime.    [provider]  calcium  acetate (PHOSLO ) 667 MG capsule Take 1,334 mg by mouth 3 (three) times daily with meals. 0800/1400/2000    [provider]  carbamazepine  (TEGRETOL ) 200 MG tablet Take 1,000  mg by mouth every morning.    [provider]  cholecalciferol  (VITAMIN D ) 1000 UNITS tablet Take 1,000 Units by mouth in the morning.    [provider]  ferrous sulfate  325 (65 FE) MG tablet Take 1 tablet (325 mg total) by mouth daily. 05/19/15   Patel, Sona, MD  Fluticasone -Salmeterol (ADVAIR) 250-50 MCG/DOSE AEPB Inhale 1 puff into the lungs 2 (two) times daily.    [provider]  guaiFENesin -dextromethorphan  (ROBITUSSIN DM) 100-10 MG/5ML syrup Take 5 mLs by mouth every 4 (four) hours as needed for cough. Patient not taking: Reported on 07/15/2023 05/04/23   Alexander, Natalie, DO  hydrALAZINE  (APRESOLINE ) 100 MG tablet Take 1 tablet (100 mg total) by mouth every 8 (eight) hours. 05/04/23   Alexander, Natalie, DO  hyoscyamine  (LEVSIN SL) 0.125 MG SL tablet Place 0.125 mg under the tongue every 4 (four) hours as needed for cramping.    [provider]  ipratropium-albuterol  (DUONEB) 0.5-2.5 (3) MG/3ML SOLN Take 3 mLs by nebulization every 6 (six) hours as needed. 05/04/23   Alexander, Natalie, DO  isosorbide  mononitrate (IMDUR ) 60 MG 24 hr tablet Take 1 tablet (60 mg total) by mouth daily. 06/19/23   Brenna Cam, MD  levothyroxine  (SYNTHROID ) 125 MCG tablet Take 1 tablet (125 mcg total) by mouth daily. 11/08/22 07/06/23  Verla Glaze, MD  nicotine  (NICODERM CQ  - DOSED IN MG/24 HOURS) 21 mg/24hr patch Place 1 patch (21 mg total) onto the skin daily. 05/05/23   Alexander, Natalie, DO  omeprazole  (PRILOSEC) 40 MG capsule Take 40 mg by mouth every morning.    [provider]  polyethylene glycol (MIRALAX  / GLYCOLAX ) 17 g packet Take 17 g by mouth daily as needed for mild constipation.    [provider]  rosuvastatin  (CRESTOR ) 10 MG tablet Take 10 mg by mouth at bedtime. 04/19/23   [provider]  traMADol  (ULTRAM ) 50 MG tablet Take 50 mg by mouth 4 (four) times daily as needed. 06/23/23   [provider]  trihexyphenidyl  (ARTANE ) 2 MG  tablet Take 2 mg by mouth 2 (two) times daily.    [provider]  zinc  sulfate 220 (50 Zn) MG capsule Take 220 mg by mouth daily.    [provider]    Physical Exam: Vitals:   10/13/23 1900 10/13/23 1930 10/13/23 2000 10/13/23 2020  BP: 111/76 108/75 97/69   Pulse: 75 76 79   Resp: 20 (!) 24 (!) 21   Temp:  98.4 F (36.9 C)    TempSrc:  Oral    SpO2: (!) 83% 99% 98%   Weight:    67.6 kg  Height:    6\' 3"  (1.905 m)   General: Not in acute distress HEENT:       Eyes: PERRL, EOMI, no jaundice       ENT: No discharge from the ears and nose, no pharynx injection, no tonsillar enlargement.  Neck: No JVD, no bruit, no mass felt. Heme: No neck lymph node enlargement. Cardiac: S1/S2, RRR, No murmurs, No gallops or rubs. Respiratory: No rales, wheezing, rhonchi or rubs. GI: Has abdominal distention, diffuse abdominal tenderness, no organomegaly, BS present. GU: No hematuria Ext: No pitting leg edema bilaterally. 1+DP/PT pulse bilaterally. Musculoskeletal: No joint deformities, No joint redness or warmth, no limitation of ROM in spin. Skin: No rashes.  Neuro: Alert, oriented X3, has eye blindness, grossly intact, moves all extremities  Psych: Patient is not psychotic, no suicidal or hemocidal ideation.  Labs on Admission: I have personally reviewed following labs and imaging studies  CBC: Recent Labs  Lab 10/13/23 1620  WBC 6.3  HGB 13.9  HCT 41.9  MCV 102.2*  PLT 223   Basic Metabolic Panel: Recent Labs  Lab 10/13/23 1620  NA 135  K 4.0  CL 95*  CO2 26  GLUCOSE 97  BUN 39*  CREATININE 7.61*  CALCIUM  9.3   GFR: Estimated Creatinine Clearance: 9.6 mL/min (A) (by C-G formula based on SCr of 7.61 mg/dL (H)). Liver Function Tests: Recent Labs  Lab 10/13/23 1620  AST 27  ALT 9  ALKPHOS 51  BILITOT 0.9  PROT 7.1  ALBUMIN 3.2*   Recent Labs  Lab 10/13/23 1620  LIPASE 26   No results for input(s): "AMMONIA" in the last 168  hours. Coagulation Profile: No results for input(s): "INR", "PROTIME" in the last 168 hours. Cardiac Enzymes: No results for input(s): "CKTOTAL", "CKMB", "CKMBINDEX", "TROPONINI" in the last 168 hours. BNP (last 3 results) No results for input(s): "PROBNP" in the last 8760 hours. HbA1C: No results for input(s): "HGBA1C" in the last 72 hours. CBG: No results for input(s): "GLUCAP" in the last 168 hours. Lipid Profile: No results for input(s): "CHOL", "HDL", "LDLCALC", "TRIG", "CHOLHDL", "LDLDIRECT" in the last 72 hours. Thyroid  Function Tests: No results for input(s): "TSH", "T4TOTAL", "FREET4", "T3FREE", "THYROIDAB" in the last 72 hours. Anemia Panel: No results for input(s): "VITAMINB12", "FOLATE", "FERRITIN", "TIBC", "IRON ", "RETICCTPCT" in the last 72 hours. Urine analysis:    Component Value Date/Time   COLORURINE YELLOW (A) 05/17/2015 0012   APPEARANCEUR HAZY (A) 05/17/2015 0012   LABSPEC 1.009 05/17/2015 0012   PHURINE 7.0 05/17/2015 0012   GLUCOSEU NEGATIVE 05/17/2015 0012   HGBUR 1+ (A) 05/17/2015 0012   BILIRUBINUR NEGATIVE 05/17/2015 0012   KETONESUR NEGATIVE 05/17/2015 0012   PROTEINUR 30 (A) 05/17/2015 0012   NITRITE NEGATIVE 05/17/2015 0012   LEUKOCYTESUR 3+ (A) 05/17/2015 0012   Sepsis Labs: @LABRCNTIP (procalcitonin:4,lacticidven:4) )No results found for this or any previous visit (from the past 240 hours).   Radiological Exams on Admission:   Assessment/Plan Principal Problem:   Postobstructive pneumonia Active Problems:   Lung mass   COPD (chronic obstructive pulmonary disease) (HCC)   Abdominal pain   Ascites   ESRD on hemodialysis (HCC)   HLD (hyperlipidemia)   Essential hypertension   CAD (coronary artery disease)   Hypothyroidism   Schizophrenia (HCC)   Blindness of both eyes   Assessment and Plan:  Postobstructive pneumonia: Patient has increased O2 requirement from 2 L to 4 L.  No fever or leukocytosis, clinically not septic.  Due to  nausea vomiting, cannot completely rule out aspiration pneumonia.  CT of chest showed right lung masslike opacity, concerning for malignancy.  - Will admit to tele bed as inpt - IV Vancomycin  and Zosyn  (patient received 1 dose of Rocephin  in ED) - Incentive spirometry - Mucinex  for cough  -  Bronchodilators - Urine legionella and S. pneumococcal antigen - Follow up blood culture x2, sputum culture - Check RSEP panel  Lung mass: CT of chest showed interval increase in the masslike opacity in the right lung, , and interval increase in the size of right lower paratracheal lymph node, concerning for malignancy. - Consulted Dr. Valentine Gasmen of oncology  COPD (chronic obstructive pulmonary disease) (HCC): No wheezing. - Bronchodilators as needed Mucinex   Abdominal pain: Patient has nausea, vomiting, abdominal pain, abdominal distention.  Need to rule out small bowel obstruction.  Lipase normal 26, liver function normal. - Follow-up CT scan of abdomen/pelvis - As needed morphine  for pain and Zofran  for nausea and vomiting   Addendum _ascites: CT scan of abdomen/breast negative for SOB, but showed large ascites.  Patient has history of HCV. -will order paracentesis to be done by IR in the morning - Give 12.5 g of albumin in AM - check ammonia level  CT scan of abdomen/pelvis 1. Negative for bowel obstruction or acute bowel inflammatory process. 2. Large volume abdominopelvic ascites. Generalized subcutaneous edema consistent with anasarca. 3. Cardiomegaly with small pericardial effusion. See separately dictated chest CT for additional findings in the chest 4. Atrophic kidneys with punctate stones. No hydronephrosis. 5. Aortic atherosclerosis.   Aortic Atherosclerosis (ICD10-I70.0).     ESRD on hemodialysis (MMF): Did not do dialysis today - Consulted Dr. Zelda Hickman of renal for dialysis  HLD (hyperlipidemia) -Crestor   Essential hypertension: Blood pressure is soft -IV hydralazine  as  needed - Hold home oral hydralazine  and Imdur   CAD (coronary artery disease) -Crestor  - Holding Imdur  due to softer blood pressure  Hypothyroidism -Synthroid   Schizophrenia (HCC): Patient is calm now - Continue home Ativan , Tegretol , Artane   Blindness of both eyes -fall precaution       DVT ppx: SQ Heparin     Code Status: Full code   Family Communication:     not done, no family member is at bed side.       Disposition Plan:  Anticipate discharge back to previous environment, group home  Consults called: Dr. Valentine Gasmen of oncology  Admission status and Level of care: Telemetry Medical:  as inpt        Dispo: The patient is from: Group home              Anticipated d/c is to: Group home              Anticipated d/c date is: 2 days              Patient currently is not medically stable to d/c.    Severity of Illness:  The appropriate patient status for this patient is INPATIENT. Inpatient status is judged to be reasonable and necessary in order to provide the required intensity of service to ensure the patient's safety. The patient's presenting symptoms, physical exam findings, and initial radiographic and laboratory data in the context of their chronic comorbidities is felt to place them at high risk for further clinical deterioration. Furthermore, it is not anticipated that the patient will be medically stable for discharge from the hospital within 2 midnights of admission.    * I certify that at the point of admission it is my clinical judgment that the patient will require inpatient hospital care spanning beyond 2 midnights from the point of admission due to high intensity of service, high risk for further deterioration and high frequency of surveillance required.*       Date of Service 10/13/2023  Eleftheria Taborn [facility]   If 7PM-7AM, please contact night-coverage [email or web address] 10/13/2023, 9:21 PM

## 2023-10-13 NOTE — ED Notes (Signed)
 Pt yelled at lab staff & refused to allow anyone to obtain 2nd set of blood cultures.

## 2023-10-13 NOTE — ED Notes (Signed)
 Pt is a difficult stick, called lab to confirm from previous shift that cultures need to be drawn. Lab states they are going to send tech to draw cultures.

## 2023-10-13 NOTE — ED Notes (Signed)
 Patient transported to CT

## 2023-10-13 NOTE — ED Provider Notes (Signed)
 [facility] Provider Note    Event Date/Time   First MD Initiated Contact with Patient 10/13/23 1547     (approximate)  History   Chief Complaint:   HPI  Todd Brown is a 63 y.o. male with a past medical history of anemia, arthritis, COPD, ESRD on HD Monday/Wednesday/Friday, hypertension, hyperlipidemia, schizophrenia, presents to the emergency department from his group facility with complaints of nausea vomiting and shortness of breath.  According to EMS per group facility patient was supposed to have dialysis performed today but could not due to transportation issues.  They also report the patient was nauseated had an episode of vomiting at the group facility and here the patient is complaining of shortness of breath saying that he needs his inhaler.  Patient denies any abdominal pain.  Physical Exam   Most recent vital signs: There were no vitals filed for this visit.  General: Awake, no distress.  CV:  Good peripheral perfusion.  Regular rate and rhythm  Resp:  Normal effort.  Equal breath sounds bilaterally.  Abd:  Moderate distention but soft to palpation and nontender.  ED Results / Procedures / Treatments   EKG  EKG viewed and interpreted by myself shows a sinus rhythm 80 bpm with a narrow QRS, right axis deviation, largely normal intervals with nonspecific ST changes.  RADIOLOGY  I have reviewed and interpreted chest x-ray images.  Patient has an opacity in the right lung concerning for pneumonia. Radiology is read the x-ray as opacity in the right lung concerning for pneumonia versus mass.  Recommend CT. CT scan performed showing an interval increase in the masslike opacity in the right lung also opacities concerning for postobstructive pneumonia.  MEDICATIONS ORDERED IN ED: Medications  ipratropium-albuterol  (DUONEB) 0.5-2.5 (3) MG/3ML nebulizer solution 3 mL (has no administration in time range)     IMPRESSION / MDM / ASSESSMENT  AND PLAN / ED COURSE  I reviewed the triage vital signs and the nursing notes.  Patient's presentation is most consistent with acute presentation with potential threat to life or bodily function.  Patient presents to the emergency department for nausea and vomiting at his group facility complaining of shortness of breath.  Patient is on dialysis Monday/Wednesday/Friday, did not receive dialysis today due to a transportation issue but due to the nausea vomiting and shortness of breath they sent the patient to the emergency department.  Here patient denies any nausea but does state shortness of breath.  We will check labs including CBC chemistry lipase will obtain a chest x-ray to evaluate for edema.  We will treat nausea with Zofran .  Will dose a DuoNeb (as the patient typically uses an albuterol  inhaler) while awaiting results.  Differential would include fluid overload, pneumonia, edema, nausea, uremia, metabolic or electrolyte abnormality.  Patient's workup today shows a reassuring CBC, chemistry overall reassuring given he is on hemodialysis potassium is normal at 4.0.  However CT scan shows this masslike opacity.  I discussed this with the patient he states he has not been told about this previously (patient is a very poor historian).  CT also shows postobstructive pneumonia which would explain the patient's increased oxygen requirement as well as nausea and cough.  We will start the patient on antibiotics and admit to the hospitalist service.  FINAL CLINICAL IMPRESSION(S) / ED DIAGNOSES   Dyspnea Postobstructive pneumonia Lung mass  Note:  This document was prepared using Dragon voice recognition software and may include unintentional dictation errors.  Ruth Cove, MD 10/13/23 [phone]

## 2023-10-13 NOTE — ED Notes (Signed)
Fed pt his dinner tray

## 2023-10-13 NOTE — ED Notes (Signed)
 Notified CT that pt was finished with dinner tray.

## 2023-10-13 NOTE — ED Notes (Addendum)
 Pt's legal guardian states she will notify pt's social worker of admission. If any consent is needed to please contact her at [phone]. Legal guardian states pt's vision hasn't been mentioned as an issue before but would check with social worker as she would be more informed.  Pt stated he couldn't see his dinner tray well enough to feed himself. Provider & receiving RN made aware.

## 2023-10-14 ENCOUNTER — Ambulatory Visit (INDEPENDENT_AMBULATORY_CARE_PROVIDER_SITE_OTHER): Payer: MEDICAID | Admitting: Vascular Surgery

## 2023-10-14 ENCOUNTER — Encounter (INDEPENDENT_AMBULATORY_CARE_PROVIDER_SITE_OTHER): Payer: MEDICAID

## 2023-10-14 DIAGNOSIS — R188 Other ascites: Secondary | ICD-10-CM

## 2023-10-14 DIAGNOSIS — E785 Hyperlipidemia, unspecified: Secondary | ICD-10-CM

## 2023-10-14 DIAGNOSIS — J449 Chronic obstructive pulmonary disease, unspecified: Secondary | ICD-10-CM

## 2023-10-14 DIAGNOSIS — I25119 Atherosclerotic heart disease of native coronary artery with unspecified angina pectoris: Secondary | ICD-10-CM

## 2023-10-14 DIAGNOSIS — R0600 Dyspnea, unspecified: Secondary | ICD-10-CM

## 2023-10-14 DIAGNOSIS — R109 Unspecified abdominal pain: Secondary | ICD-10-CM | POA: Diagnosis not present

## 2023-10-14 DIAGNOSIS — Z992 Dependence on renal dialysis: Secondary | ICD-10-CM

## 2023-10-14 DIAGNOSIS — I10 Essential (primary) hypertension: Secondary | ICD-10-CM

## 2023-10-14 DIAGNOSIS — J189 Pneumonia, unspecified organism: Secondary | ICD-10-CM | POA: Diagnosis not present

## 2023-10-14 LAB — CBC
HCT: 37.1 % — ABNORMAL LOW (ref 39.0–52.0)
Hemoglobin: 12.9 g/dL — ABNORMAL LOW (ref 13.0–17.0)
MCH: 34.4 pg — ABNORMAL HIGH (ref 26.0–34.0)
MCHC: 34.8 g/dL (ref 30.0–36.0)
MCV: 98.9 fL (ref 80.0–100.0)
Platelets: 204 10*3/uL (ref 150–400)
RBC: 3.75 MIL/uL — ABNORMAL LOW (ref 4.22–5.81)
RDW: 13.8 % (ref 11.5–15.5)
WBC: 6.1 10*3/uL (ref 4.0–10.5)
nRBC: 0 % (ref 0.0–0.2)

## 2023-10-14 LAB — RESP PANEL BY RT-PCR (RSV, FLU A&B, COVID)  RVPGX2
Influenza A by PCR: NEGATIVE
Influenza B by PCR: NEGATIVE
Resp Syncytial Virus by PCR: NEGATIVE
SARS Coronavirus 2 by RT PCR: NEGATIVE

## 2023-10-14 LAB — BASIC METABOLIC PANEL WITH GFR
Anion gap: 14 (ref 5–15)
BUN: 45 mg/dL — ABNORMAL HIGH (ref 8–23)
CO2: 26 mmol/L (ref 22–32)
Calcium: 8.8 mg/dL — ABNORMAL LOW (ref 8.9–10.3)
Chloride: 95 mmol/L — ABNORMAL LOW (ref 98–111)
Creatinine, Ser: 8.26 mg/dL — ABNORMAL HIGH (ref 0.61–1.24)
GFR, Estimated: 7 mL/min — ABNORMAL LOW (ref >60)
Glucose, Bld: 85 mg/dL (ref 70–99)
Potassium: 4 mmol/L (ref 3.5–5.1)
Sodium: 135 mmol/L (ref 135–145)

## 2023-10-14 LAB — HEPATITIS B SURFACE ANTIGEN: Hepatitis B Surface Ag: NONREACTIVE

## 2023-10-14 LAB — MRSA NEXT GEN BY PCR, NASAL: MRSA by PCR Next Gen: NOT DETECTED

## 2023-10-14 MED ORDER — CHLORHEXIDINE GLUCONATE CLOTH 2 % EX PADS
6.0000 | MEDICATED_PAD | Freq: Every day | CUTANEOUS | Status: DC
  Administered 2023-10-14 – 2023-10-17 (×4): 6 via TOPICAL

## 2023-10-14 NOTE — Progress Notes (Addendum)
 Todd Brown, case worker, appeared to receive information regarding patient. She was not listed on facesheet; however was able to provide court appointed guardianship paperwork clarifying her position.

## 2023-10-14 NOTE — Plan of Care (Signed)
  Problem: Education: Goal: Knowledge of General Education information will improve Description: Including pain rating scale, medication(s)/side effects and non-pharmacologic comfort measures Outcome: Progressing   Problem: Clinical Measurements: Goal: Ability to maintain clinical measurements within normal limits will improve Outcome: Progressing Goal: Will remain free from infection Outcome: Progressing Goal: Diagnostic test results will improve Outcome: Progressing Goal: Respiratory complications will improve Outcome: Progressing   Problem: Activity: Goal: Risk for activity intolerance will decrease Outcome: Progressing   

## 2023-10-14 NOTE — Progress Notes (Signed)
 Patient alert and stable upon return from HD.

## 2023-10-14 NOTE — Progress Notes (Signed)
 SLP Cancellation Note  Patient Details Name: Todd Brown MRN: [id number] DOB: 10/03/1960   Cancelled treatment:       Reason Eval/Treat Not Completed: Patient at procedure or test/unavailable (HD.  SLP reviewed chart. Will f/u tomorrow.)    Darla Edward, MS, CCC-SLP Speech Language Pathologist Rehab Services; [facility] [phone] (ascom) Uma Jerde 10/14/2023, 3:31 PM

## 2023-10-14 NOTE — Progress Notes (Addendum)
 Patient stable and A/Ox2 at hospital baseline prior to transport to dialysis.

## 2023-10-14 NOTE — Plan of Care (Signed)

## 2023-10-14 NOTE — Progress Notes (Signed)
 [facility]  ROUNDING NOTE   Subjective:   Todd Brown is a 63 year old male with past medical conditions including COPD, hypertension, DVT, schizophrenia, and end-stage renal disease on hemodialysis.  Patient presents to the emergency department with cough and shortness of breath.  Patient has been admitted for Postobstructive pneumonia [J18.9]  Patient is known to our practice and receives outpatient dialysis treatments at [facility] on a MWF schedule, supervised by Dr. Rhesa Celeste.  Last treatment received on Monday.  Patient reports to the emergency department from group home complaining of shortness of breath and abdominal pain.  Patient is seen resting in bed.  Alert and oriented.  Denies shortness of breath at this time.  Patient currently on 4 L nasal cannula, has baseline of 2 L nasal cannula.  Denies pain or discomfort.  Does have some lower extremity edema.  Requesting meals, currently n.p.o. for possible procedure.  Serologic labs on ED arrival unremarkable for renal patient.  Chest x-ray shows parenchymal consolidative opacities in the right lung concerning for pneumonia.  CT chest shows a masslike opacity in the right lung concerning for malignancy.  CT abdomen pelvis showed large volume pelvic ascites, small pericardial effusions and atrophic kidneys.  We have been consulted to manage dialysis needs during this admission.   Objective:  Vital signs in last 24 hours:  Temp:  [97.8 F (36.6 C)-98.7 F (37.1 C)] 98.2 F (36.8 C) (05/15 1324) Pulse Rate:  [69-84] 74 (05/15 1400) Resp:  [16-25] 16 (05/15 1400) BP: (89-155)/(63-93) 90/68 (05/15 1400) SpO2:  [83 %-99 %] 96 % (05/15 1400) Weight:  [67.6 kg-83.4 kg] 81.1 kg (05/15 1324)  Weight change:  Filed Weights   10/13/23 2020 10/14/23 0500 10/14/23 1324  Weight: 67.6 kg 83.4 kg 81.1 kg    Intake/Output: No intake/output data recorded.   Intake/Output this shift:  Total I/O In: 321 [P.O.:180; IV  Piggyback:141] Out: -   Physical Exam: General: NAD  Head: Normocephalic, atraumatic. Moist oral mucosal membranes  Eyes: Anicteric  Lungs:  Clear to auscultation, normal effort, NCO 2  Heart: Regular rate and rhythm  Abdomen:  Soft, nontender, distention  Extremities: 1+ peripheral edema.  Neurologic: Alert and oriented, moving all four extremities  Skin: No lesions  Access: Left aVF    Basic Metabolic Panel: Recent Labs  Lab 10/13/23 1620 10/14/23 0610  NA 135 135  K 4.0 4.0  CL 95* 95*  CO2 26 26  GLUCOSE 97 85  BUN 39* 45*  CREATININE 7.61* 8.26*  CALCIUM  9.3 8.8*    Liver Function Tests: Recent Labs  Lab 10/13/23 1620  AST 27  ALT 9  ALKPHOS 51  BILITOT 0.9  PROT 7.1  ALBUMIN 3.2*   Recent Labs  Lab 10/13/23 1620  LIPASE 26   Recent Labs  Lab 10/13/23 2250  AMMONIA <13    CBC: Recent Labs  Lab 10/13/23 1620 10/14/23 0610  WBC 6.3 6.1  HGB 13.9 12.9*  HCT 41.9 37.1*  MCV 102.2* 98.9  PLT 223 204    Cardiac Enzymes: No results for input(s): "CKTOTAL", "CKMB", "CKMBINDEX", "TROPONINI" in the last 168 hours.  BNP: Invalid input(s): "POCBNP"  CBG: No results for input(s): "GLUCAP" in the last 168 hours.  Microbiology: Results for orders placed or performed during the hospital encounter of 10/13/23  Blood culture (routine x 2)     Status: None (Preliminary result)   Collection Time: 10/13/23  9:14 PM   Specimen: BLOOD  Result Value Ref Range  Status   Specimen Description BLOOD BLOOD RIGHT ARM  Final   Special Requests   Final    BOTTLES DRAWN AEROBIC AND ANAEROBIC Blood Culture adequate volume   Culture   Final    NO GROWTH < 12 HOURS Performed at [facility], [address] [place], [place] [address]    Report Status PENDING  Incomplete  Blood culture (routine x 2)     Status: None (Preliminary result)   Collection Time: 10/13/23 10:50 PM   Specimen: BLOOD  Result Value Ref Range Status   Specimen Description  BLOOD BLOOD RIGHT ARM  Final   Special Requests   Final    BOTTLES DRAWN AEROBIC ONLY Blood Culture results may not be optimal due to an inadequate volume of blood received in culture bottles   Culture   Final    NO GROWTH < 12 HOURS Performed at [facility], [address] [place], [place] [address]    Report Status PENDING  Incomplete  MRSA Next Gen by PCR, Nasal     Status: None   Collection Time: 10/13/23 11:09 PM   Specimen: Nasal Mucosa; Nasal Swab  Result Value Ref Range Status   MRSA by PCR Next Gen NOT DETECTED NOT DETECTED Final    Comment: (NOTE) The GeneXpert MRSA Assay (FDA approved for NASAL specimens only), is one component of a comprehensive MRSA colonization surveillance program. It is not intended to diagnose MRSA infection nor to guide or monitor treatment for MRSA infections. Test performance is not FDA approved in patients less than 71 years old. Performed at [facility], [address] [place], [place] [address]   Resp panel by RT-PCR (RSV, Flu A&B, Covid)     Status: None   Collection Time: 10/14/23  2:41 AM  Result Value Ref Range Status   SARS Coronavirus 2 by RT PCR NEGATIVE NEGATIVE Final    Comment: (NOTE) SARS-CoV-2 target nucleic acids are NOT DETECTED.  The SARS-CoV-2 RNA is generally detectable in upper respiratory specimens during the acute phase of infection. The lowest concentration of SARS-CoV-2 viral copies this assay can detect is 138 copies/mL. A negative result does not preclude SARS-Cov-2 infection and should not be used as the sole basis for treatment or other patient management decisions. A negative result may occur with  improper specimen collection/handling, submission of specimen other than nasopharyngeal swab, presence of viral mutation(s) within the areas targeted by this assay, and inadequate number of viral copies(<138 copies/mL). A negative result must be combined with clinical observations, patient  history, and epidemiological information. The expected result is Negative.  Fact Sheet for Patients:  BloggerCourse.com  Fact Sheet for Healthcare Providers:  SeriousBroker.it  This test is no t yet approved or cleared by the [place]  FDA and  has been authorized for detection and/or diagnosis of SARS-CoV-2 by FDA under an Emergency Use Authorization (EUA). This EUA will remain  in effect (meaning this test can be used) for the duration of the COVID-19 declaration under Section 564(b)(1) of the Act, 21 U.S.C.section 360bbb-3(b)(1), unless the authorization is terminated  or revoked sooner.       Influenza A by PCR NEGATIVE NEGATIVE Final   Influenza B by PCR NEGATIVE NEGATIVE Final    Comment: (NOTE) The Xpert Xpress SARS-CoV-2/FLU/RSV plus assay is intended as an aid in the diagnosis of influenza from Nasopharyngeal swab specimens and should not be used as a sole basis for treatment. Nasal washings and aspirates are unacceptable for Xpert  Xpress SARS-CoV-2/FLU/RSV testing.  Fact Sheet for Patients: BloggerCourse.com  Fact Sheet for Healthcare Providers: SeriousBroker.it  This test is not yet approved or cleared by the [place]  FDA and has been authorized for detection and/or diagnosis of SARS-CoV-2 by FDA under an Emergency Use Authorization (EUA). This EUA will remain in effect (meaning this test can be used) for the duration of the COVID-19 declaration under Section 564(b)(1) of the Act, 21 U.S.C. section 360bbb-3(b)(1), unless the authorization is terminated or revoked.     Resp Syncytial Virus by PCR NEGATIVE NEGATIVE Final    Comment: (NOTE) Fact Sheet for Patients: BloggerCourse.com  Fact Sheet for Healthcare Providers: SeriousBroker.it  This test is not yet approved or cleared by the [place]  FDA  and has been authorized for detection and/or diagnosis of SARS-CoV-2 by FDA under an Emergency Use Authorization (EUA). This EUA will remain in effect (meaning this test can be used) for the duration of the COVID-19 declaration under Section 564(b)(1) of the Act, 21 U.S.C. section 360bbb-3(b)(1), unless the authorization is terminated or revoked.  Performed at [facility], [address] [place], [place] [address]     Coagulation Studies: No results for input(s): "LABPROT", "INR" in the last 72 hours.  Urinalysis: No results for input(s): "COLORURINE", "LABSPEC", "PHURINE", "GLUCOSEU", "HGBUR", "BILIRUBINUR", "KETONESUR", "PROTEINUR", "UROBILINOGEN", "NITRITE", "LEUKOCYTESUR" in the last 72 hours.  Invalid input(s): "APPERANCEUR"    Imaging: CT ABDOMEN PELVIS WO CONTRAST Result Date: 10/13/2023 CLINICAL DATA:  Nausea vomiting and short of breath EXAM: CT ABDOMEN AND PELVIS WITHOUT CONTRAST TECHNIQUE: Multidetector CT imaging of the abdomen and pelvis was performed following the standard protocol without IV contrast. RADIATION DOSE REDUCTION: This exam was performed according to the departmental dose-optimization program which includes automated exposure control, adjustment of the mA and/or kV according to patient size and/or use of iterative reconstruction technique. COMPARISON:  CT 11/03/2022, chest CT 10/13/2023 FINDINGS: Lower chest: See separately dictated chest CT. Partially visualized mass/masslike consolidation in the right lower lobe. Partial left lower lobe consolidation. Bilateral pleural effusions. Cardiomegaly. Coronary vascular calcification. Small pericardial effusion Hepatobiliary: No calcified gallstone. No focal hepatic abnormality or biliary dilatation Pancreas: Unremarkable. No pancreatic ductal dilatation or surrounding inflammatory changes. Spleen: Normal in size without focal abnormality. Adrenals/Urinary Tract: Enlarged thickened adrenal glands as before.  2.3 cm suspected left adrenal adenoma for which no imaging follow-up is recommended. Atrophic kidneys without hydronephrosis. Bilateral renal cysts for which no imaging follow-up is recommended. Punctate stones in the kidneys. Bladder is decompressed. Stomach/Bowel: Centralization of the bowel. Nondistended stomach. No dilated small bowel. Mild diffuse colon wall thickening versus under distension, chronic. No definite acute inflammatory bowel process. Vascular/Lymphatic: Advanced aortic atherosclerosis. No aneurysm. No suspicious lymph nodes Reproductive: Negative prostate.  Fluid in the scrotum Other: No free air. Large volume abdominopelvic ascites. Generalized subcutaneous edema consistent with anasarca Musculoskeletal: No acute osseous abnormality IMPRESSION: 1. Negative for bowel obstruction or acute bowel inflammatory process. 2. Large volume abdominopelvic ascites. Generalized subcutaneous edema consistent with anasarca. 3. Cardiomegaly with small pericardial effusion. See separately dictated chest CT for additional findings in the chest 4. Atrophic kidneys with punctate stones. No hydronephrosis. 5. Aortic atherosclerosis. Aortic Atherosclerosis (ICD10-I70.0). Electronically Signed   By: Esmeralda Hedge M.D.   On: 10/13/2023 20:27   CT CHEST WO CONTRAST Result Date: 10/13/2023 CLINICAL DATA:  Abnormal xray - lung opacity/opacities. EXAM: CT CHEST WITHOUT CONTRAST TECHNIQUE: Multidetector CT imaging of the chest was performed following the standard protocol without IV contrast. RADIATION DOSE REDUCTION:  This exam was performed according to the departmental dose-optimization program which includes automated exposure control, adjustment of the mA and/or kV according to patient size and/or use of iterative reconstruction technique. COMPARISON:  CT angiography chest from 07/05/2023. FINDINGS: Cardiovascular: Mild cardiomegaly. No pericardial effusion. No aortic aneurysm. There are coronary artery  calcifications, in keeping with coronary artery disease. There are also mild peripheral atherosclerotic vascular calcifications of thoracic aorta and its major branches. There is dilation of the main pulmonary trunk measuring up to 3.8 cm, which is nonspecific but can be seen with pulmonary artery hypertension. Mediastinum/Nodes: Visualized thyroid  gland appears grossly unremarkable. No solid / cystic mediastinal masses. The esophagus is nondistended precluding optimal assessment. There is an enlarged right lower paratracheal lymph node measuring 1.2 x 2.0 cm, which appears mildly enlarged since the prior study when it measured up to 0.9 x 1.8 cm. No axillary lymphadenopathy by size criteria. Evaluation of bilateral hila is limited due to lack on intravenous contrast: however, no large hilar lymphadenopathy identified. Lungs/Pleura: The central tracheo-bronchial tree is patent. There are diffuse moderate to severe centrilobular emphysematous changes throughout bilateral lungs. Since the prior study, there is interval increase in the masslike opacity in the right lung lower lobe. The lesion measures at least 6.8 x 9.5 cm, orthogonally on axial plane. The opacity extends across the right major fissure into the posterior segment of right upper lobe. There are new heterogeneous opacities in the right lung lower lobe which may represent postobstructive pneumonia. There is small right pleural effusion, which has increased since the prior study. There are multi segmental atelectatic changes at the left lung lower lobe, which also appears increased in size since the prior study. There is small left pleural effusion which is also slightly increased. There is lobulated approximately 1.2 x 1.2 cm nodule in the left lung lower lobe which appear increased in size since the prior study when it measured up to 8 x 13 mm. Upper Abdomen: There is small-to-moderate ascites. Bilateral small/atrophic kidneys noted. There are several  hypoattenuating structures in bilateral kidneys, not well evaluated on the current exam. There is small sliding hiatal hernia. Remaining visualized upper abdominal viscera within normal limits. Musculoskeletal: The visualized soft tissues of the chest wall are grossly unremarkable. No suspicious osseous lesions. Probable old healed fracture of the mid sternal body noted. There are mild multilevel degenerative changes in the visualized spine. 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 IMPRESSION: 1. Since the prior study, there is interval increase in the masslike opacity in the right lung, which is concerning for malignancy. There are new heterogeneous opacities in the right lung lower lobe, which may represent postobstructive pneumonia. Pulmonary consultation and further evaluation with contrast-enhanced chest CT scan is recommended. 2. There is also interval increase in the size of left lung lower lobe nodule. 3. There is interval increase in bilateral pleural effusions. 4. There is interval increase in the size of right lower paratracheal lymph node, which may be reactive versus metastatic. 5. Multiple other nonacute observations, as described above. Aortic Atherosclerosis (ICD10-I70.0) and Emphysema (ICD10-J43.9). Electronically Signed   By: Beula Brunswick M.D.   On: 10/13/2023 18:14   DG Chest Portable 1 View Result Date: 10/13/2023 CLINICAL DATA:  Shortness of breath EXAM: PORTABLE CHEST 1 VIEW COMPARISON:  July 05, 2023 FINDINGS: Parenchymal consolidative opacity of the right lung involving particularly right mid and right lower lobe region correlate with pneumonia. The possibility of underlying mass  is not excluded due to the round lobular appearance. And findings correlate with the prior CT chest angio July 05, 2023 that demonstrated a round consolidating process of the right lower lobe. Ill-defined left lower lobe infiltrates and  atelectasis. Correlation with CT recommended. Heart and mediastinum within normal limits IMPRESSION: *Parenchymal consolidative opacity of the right lung involving particularly right mid and right lower lobe region correlate with pneumonia. The possibility of underlying mass is not excluded due to the round lobular appearance. *Ill-defined left lower lobe infiltrates and atelectasis. Correlation with CT recommended. Electronically Signed   By: Fredrich Jefferson M.D.   On: 10/13/2023 16:22     Medications:    piperacillin-tazobactam (ZOSYN)  IV Stopped (10/14/23 0807)    ascorbic acid   500 mg Oral Daily   carbamazepine   1,000 mg Oral q morning   Chlorhexidine  Gluconate Cloth  6 each Topical Q0600   cholecalciferol   1,000 Units Oral Daily   ferrous sulfate   325 mg Oral Daily   fluticasone  furoate-vilanterol  1 puff Inhalation Daily   heparin   5,000 Units Subcutaneous Q8H   ipratropium-albuterol   3 mL Nebulization TID   levothyroxine   125 mcg Oral Q0600   multivitamin  1 tablet Oral QHS   pantoprazole   40 mg Oral Daily   rosuvastatin   10 mg Oral QHS   trihexyphenidyl   2 mg Oral BID   zinc  sulfate (50mg  elemental zinc )  220 mg Oral Daily   acetaminophen , albuterol , dextromethorphan -guaiFENesin , hydrALAZINE , hyoscyamine , LORazepam , morphine  injection, ondansetron  (ZOFRAN ) IV, polyethylene glycol, traMADol   Assessment/ Plan:  Todd Brown is a 63 y.o.  male  with past medical conditions including COPD, hypertension, DVT, schizophrenia, and end-stage renal disease on hemodialysis.  Patient presents to the emergency department with cough and shortness of breath.  Patient has been admitted for Postobstructive pneumonia [J18.9]  [facility]/MWF/left aVF  End-stage renal disease on hemodialysis.  Last treatment completed on Monday.  Will complete short treatment today.  Next treatment scheduled for Friday to maintain outpatient schedule.   2.  Acute respiratory failure requiring  increased oxygen requirement.  Baseline 2 L now 4 L.  Chest x-ray suspicious for pneumonia.  Primary team has ordered supportive measures with antitussives and bronchodilators. Broad-spectrum antibiotics ordered.  3.  Lung mass, unspecified.  CT abdomen pelvis show right lobe lung mass, increased in size since previous imaging.  Concerning for malignancy.  Primary team has consulted oncology.  4. Anemia of chronic kidney disease.  Normocytic Lab Results  Component Value Date   HGB 12.9 (L) 10/14/2023    Hemoglobin currently within optimal range.  No need for ESA's at this time.  Patient does receive Mircera at outpatient clinic.  5. Secondary Hyperparathyroidism: with outpatient labs: none available  Lab Results  Component Value Date   PTH 194 (H) 05/11/2017   CALCIUM  8.8 (L) 10/14/2023   CAION 1.19 03/27/2022   PHOS 2.7 07/07/2023    Will continue to monitor bone minerals.  Calcium  acceptable.  Will obtain updated phosphorus in AM.   LOS: 1 Bryelle Spiewak 5/15/20252:28 PM

## 2023-10-14 NOTE — Plan of Care (Signed)
  Problem: Education: Goal: Knowledge of General Education information will improve Description: Including pain rating scale, medication(s)/side effects and non-pharmacologic comfort measures Outcome: Progressing   Problem: Health Behavior/Discharge Planning: Goal: Ability to manage health-related needs will improve Outcome: Progressing   Problem: Clinical Measurements: Goal: Ability to maintain clinical measurements within normal limits will improve Outcome: Progressing Goal: Will remain free from infection Outcome: Progressing Goal: Diagnostic test results will improve Outcome: Progressing Goal: Respiratory complications will improve Outcome: Progressing Goal: Cardiovascular complication will be avoided Outcome: Progressing   Problem: Activity: Goal: Risk for activity intolerance will decrease Outcome: Progressing   Problem: Nutrition: Goal: Adequate nutrition will be maintained Outcome: Progressing Note: Pt NPO for paracentesis; patient tooks meds with applesauce and tolerated great. Patient asking for food and stating he is very hungry.   Problem: Coping: Goal: Level of anxiety will decrease Outcome: Progressing   Problem: Elimination: Goal: Will not experience complications related to bowel motility Outcome: Progressing Goal: Will not experience complications related to urinary retention Outcome: Progressing Note: Anuric at baseline - HD pt   Problem: Pain Managment: Goal: General experience of comfort will improve and/or be controlled Outcome: Progressing   Problem: Safety: Goal: Ability to remain free from injury will improve Outcome: Progressing   Problem: Skin Integrity: Goal: Risk for impaired skin integrity will decrease Outcome: Progressing   Problem: Education: Goal: Knowledge of disease or condition will improve Outcome: Progressing Goal: Knowledge of the prescribed therapeutic regimen will improve Outcome: Progressing Goal: Individualized  Educational Video(s) Outcome: Progressing   Problem: Activity: Goal: Ability to tolerate increased activity will improve Outcome: Progressing Goal: Will verbalize the importance of balancing activity with adequate rest periods Outcome: Progressing   Problem: Respiratory: Goal: Ability to maintain a clear airway will improve Outcome: Progressing Goal: Levels of oxygenation will improve Outcome: Progressing Note: 2L at baseline. Goal: Ability to maintain adequate ventilation will improve Outcome: Progressing   Problem: Activity: Goal: Ability to tolerate increased activity will improve Outcome: Progressing   Problem: Clinical Measurements: Goal: Ability to maintain a body temperature in the normal range will improve Outcome: Progressing   Problem: Respiratory: Goal: Ability to maintain adequate ventilation will improve Outcome: Progressing Goal: Ability to maintain a clear airway will improve Outcome: Progressing Note: Respiratory panel negative; however pt still coughing.

## 2023-10-14 NOTE — Consult Note (Signed)
 Palliative [facility] at [facility] [phone] [phone]   Name: Todd Brown Date: 10/14/2023 MRN: [id number]  DOB: 01/29/1961  Patient Care Team: Todd Meadow, NP as PCP - General (Family Medicine) Todd Daughters, MD (Internal Medicine)    REASON FOR CONSULTATION: Todd Brown is a 63 y.o. male with multiple medical problems including ESRD on HD, schizophrenia, hypertension, hyperlipidemia, COPD on 2 L O2, CAD, history of DVT not on anticoagulants, history of cardiac arrest in 2018 from hyperkalemia, history of recurrent infections, and blindness.  Patient was admitted the hospital on 10/13/2023 with concern for postobstructive pneumonia.  CT of the chest showed a right lung mass concerning for malignancy.  Palliative care was consulted to address goals.  SOCIAL HISTORY:     reports that he has never smoked. He has never been exposed to tobacco smoke. He has never used smokeless tobacco. He reports that he does not currently use alcohol. He reports that he does not currently use drugs after having used the following drugs: Marijuana.  Reportedly, patient has no surviving family.  He lives in a group home.  He is a ward of the state.  ADVANCE DIRECTIVES:  Patient has a legal guardian -DSS  CODE STATUS: DNR  PAST MEDICAL HISTORY: Past Medical History:  Diagnosis Date   Anemia    Arthritis    Chronic respiratory failure (HCC)    COPD (chronic obstructive pulmonary disease) (HCC)    Dialysis patient (HCC)    Mon. 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   ESRD (end stage renal disease) (HCC)    ESRD (end stage renal disease) on dialysis (HCC)    Hyperlipidemia    Hypertension    Malignant hypertension    Pneumonia 2016   Renal artery stenosis (HCC)    Schizophrenia (HCC)     PAST SURGICAL HISTORY:  Past Surgical History:  Procedure Laterality Date    A/V FISTULAGRAM N/A 08/06/2016   Procedure: A/V Fistulagram;  Surgeon: Celso College, MD;  Location: [facility];  Service: Cardiovascular;  Laterality: N/A;   A/V FISTULAGRAM N/A 02/14/2021   Procedure: A/V FISTULAGRAM poss Perm Cath Insertion;  Surgeon: Jackquelyn Mass, MD;  Location: [facility];  Service: Cardiovascular;  Laterality: N/A;   A/V FISTULAGRAM Left 08/26/2021   Procedure: A/V Fistulagram;  Surgeon: Celso College, MD;  Location: [facility];  Service: Cardiovascular;  Laterality: Left;   A/V SHUNT INTERVENTION N/A 08/06/2016   Procedure: A/V Shunt Intervention;  Surgeon: Celso College, MD;  Location: [facility];  Service: Cardiovascular;  Laterality: N/A;   AV FISTULA PLACEMENT Left 12/26/2014   Procedure: ARTERIOVENOUS (AV) FISTULA CREATION;  Surgeon: Celso College, MD;  Location: [facility];  Service: Vascular;  Laterality: Left;   AV FISTULA PLACEMENT     ESOPHAGOGASTRODUODENOSCOPY (EGD) WITH PROPOFOL  N/A 05/06/2017   Procedure: ESOPHAGOGASTRODUODENOSCOPY (EGD) WITH PROPOFOL ;  Surgeon: Luke Salaam, MD;  Location: [facility];  Service: Gastroenterology;  Laterality: N/A;   INSERTION OF DIALYSIS CATHETER Right    PERIPHERAL VASCULAR CATHETERIZATION N/A 10/22/2014   Procedure: Dialysis/Perma Catheter Insertion;  Surgeon: Celso College, MD;  Location: [facility];  Service: Cardiovascular;  Laterality: N/A;   PERIPHERAL VASCULAR CATHETERIZATION N/A 02/28/2015   Procedure: Dialysis/Perma Catheter Removal;  Surgeon: Celso College, MD;  Location: [facility];  Service: Cardiovascular;  Laterality: N/A;   Repair fx left lower leg     yrs ago (age 18)   REVISON OF ARTERIOVENOUS FISTULA Left 03/27/2022   Procedure: REVISON OF ARTERIOVENOUS FISTULA (JUMP GRAFT);  Surgeon: Jackquelyn Mass, MD;  Location: [facility];  Service: Vascular;  Laterality: Left;   TEE WITHOUT CARDIOVERSION N/A 09/21/2022   Procedure: TRANSESOPHAGEAL ECHOCARDIOGRAM;   Surgeon: Constancia Delton, MD;  Location: [facility];  Service: Cardiovascular;  Laterality: N/A;   TONSILLECTOMY      HEMATOLOGY/ONCOLOGY HISTORY:  Oncology History   No history exists.    ALLERGIES:  is allergic to chlorpromazine.  MEDICATIONS:  Current Facility-Administered Medications  Medication Dose Route Frequency Provider Last Rate Last Admin   acetaminophen  (TYLENOL ) tablet 650 mg  650 mg Oral Q6H PRN Niu, Xilin, MD       albuterol  (PROVENTIL ) (2.5 MG/3ML) 0.083% nebulizer solution 2.5 mg  2.5 mg Inhalation Q4H PRN Niu, Xilin, MD       ascorbic acid  (VITAMIN C ) tablet 500 mg  500 mg Oral Daily Niu, Xilin, MD   500 mg at 10/14/23 0818   carbamazepine  (TEGRETOL ) tablet 1,000 mg  1,000 mg Oral q morning Niu, Xilin, MD   1,000 mg at 10/14/23 1610   Chlorhexidine  Gluconate Cloth 2 % PADS 6 each  6 each Topical Q0600 Anola King, NP   6 each at 10/14/23 0914   cholecalciferol  (VITAMIN D3) 25 MCG (1000 UNIT) tablet 1,000 Units  1,000 Units Oral Daily Niu, Xilin, MD   1,000 Units at 10/14/23 0818   dextromethorphan -guaiFENesin  (MUCINEX  DM) 30-600 MG per 12 hr tablet 1 tablet  1 tablet Oral BID PRN Niu, Xilin, MD       ferrous sulfate  tablet 325 mg  325 mg Oral Daily Niu, Xilin, MD   325 mg at 10/14/23 0818   fluticasone  furoate-vilanterol (BREO ELLIPTA ) 200-25 MCG/ACT 1 puff  1 puff Inhalation Daily Niu, Xilin, MD   1 puff at 10/14/23 0820   heparin  injection 5,000 Units  5,000 Units Subcutaneous Q8H Niu, Xilin, MD   5,000 Units at 10/14/23 9604   hydrALAZINE  (APRESOLINE ) injection 5 mg  5 mg Intravenous Q2H PRN Niu, Xilin, MD       hyoscyamine  (LEVSIN SL) SL tablet 0.125 mg  0.125 mg Sublingual Q4H PRN Niu, Xilin, MD       ipratropium-albuterol  (DUONEB) 0.5-2.5 (3) MG/3ML nebulizer solution 3 mL  3 mL Nebulization TID Niu, Xilin, MD   3 mL at 10/14/23 0759   levothyroxine  (SYNTHROID ) tablet 125 mcg  125 mcg Oral Q0600 Niu, Xilin, MD   125 mcg at 10/14/23 0627   LORazepam  (ATIVAN )  tablet 0.5 mg  0.5 mg Oral Q4H PRN Niu, Xilin, MD       morphine  (PF) 2 MG/ML injection 1 mg  1 mg Intravenous Q3H PRN Niu, Xilin, MD       multivitamin (RENA-VIT) tablet 1 tablet  1 tablet Oral QHS Niu, Xilin, MD       ondansetron  (ZOFRAN ) injection 4 mg  4 mg Intravenous Q8H PRN Niu, Xilin, MD       pantoprazole  (PROTONIX ) EC tablet 40 mg  40 mg Oral Daily Niu, Xilin, MD   40 mg at 10/14/23 0819   piperacillin-tazobactam (ZOSYN) IVPB 2.25 g  2.25 g Intravenous Q8H Alice Innocent, RPH   Stopped at 10/14/23 0807   polyethylene glycol (MIRALAX  / GLYCOLAX ) packet 17 g  17 g Oral Daily PRN Niu, Xilin, MD  rosuvastatin  (CRESTOR ) tablet 10 mg  10 mg Oral QHS Niu, Xilin, MD   10 mg at 10/13/23 2330   traMADol  (ULTRAM ) tablet 50 mg  50 mg Oral Q6H PRN Niu, Xilin, MD       trihexyphenidyl  (ARTANE ) tablet 2 mg  2 mg Oral BID Niu, Xilin, MD   2 mg at 10/14/23 0819   zinc  sulfate (50mg  elemental zinc ) capsule 220 mg  220 mg Oral Daily Niu, Xilin, MD   220 mg at 10/14/23 0818    VITAL SIGNS: BP 101/68 (BP Location: Right Arm)   Pulse 84   Temp 98.2 F (36.8 C) (Oral)   Resp 18   Ht 6\' 3"  (1.905 m)   Wt 183 lb 13.8 oz (83.4 kg)   SpO2 93%   BMI 22.98 kg/m  Filed Weights   10/13/23 2020 10/14/23 0500  Weight: 149 lb (67.6 kg) 183 lb 13.8 oz (83.4 kg)    Estimated body mass index is 22.98 kg/m as calculated from the following:   Height as of this encounter: 6\' 3"  (1.905 m).   Weight as of this encounter: 183 lb 13.8 oz (83.4 kg).  LABS: CBC:    Component Value Date/Time   WBC 6.1 10/14/2023 0610   HGB 12.9 (L) 10/14/2023 0610   HCT 37.1 (L) 10/14/2023 0610   PLT 204 10/14/2023 0610   MCV 98.9 10/14/2023 0610   NEUTROABS 5.8 07/05/2023 2007   LYMPHSABS 0.8 07/05/2023 2007   MONOABS 0.7 07/05/2023 2007   EOSABS 0.1 07/05/2023 2007   BASOSABS 0.0 07/05/2023 2007   Comprehensive Metabolic Panel:    Component Value Date/Time   NA 135 10/14/2023 0610   K 4.0 10/14/2023 0610   CL  95 (L) 10/14/2023 0610   CO2 26 10/14/2023 0610   BUN 45 (H) 10/14/2023 0610   CREATININE 8.26 (H) 10/14/2023 0610   CREATININE 3.47 (H) 03/23/2013 1501   GLUCOSE 85 10/14/2023 0610   CALCIUM  8.8 (L) 10/14/2023 0610   CALCIUM  8.1 (L) 10/22/2014 0504   AST 27 10/13/2023 1620   ALT 9 10/13/2023 1620   ALKPHOS 51 10/13/2023 1620   BILITOT 0.9 10/13/2023 1620   PROT 7.1 10/13/2023 1620   ALBUMIN 3.2 (L) 10/13/2023 1620    RADIOGRAPHIC STUDIES: CT ABDOMEN PELVIS WO CONTRAST Result Date: 10/13/2023 CLINICAL DATA:  Nausea vomiting and short of breath EXAM: CT ABDOMEN AND PELVIS WITHOUT CONTRAST TECHNIQUE: Multidetector CT imaging of the abdomen and pelvis was performed following the standard protocol without IV contrast. RADIATION DOSE REDUCTION: This exam was performed according to the departmental dose-optimization program which includes automated exposure control, adjustment of the mA and/or kV according to patient size and/or use of iterative reconstruction technique. COMPARISON:  CT 11/03/2022, chest CT 10/13/2023 FINDINGS: Lower chest: See separately dictated chest CT. Partially visualized mass/masslike consolidation in the right lower lobe. Partial left lower lobe consolidation. Bilateral pleural effusions. Cardiomegaly. Coronary vascular calcification. Small pericardial effusion Hepatobiliary: No calcified gallstone. No focal hepatic abnormality or biliary dilatation Pancreas: Unremarkable. No pancreatic ductal dilatation or surrounding inflammatory changes. Spleen: Normal in size without focal abnormality. Adrenals/Urinary Tract: Enlarged thickened adrenal glands as before. 2.3 cm suspected left adrenal adenoma for which no imaging follow-up is recommended. Atrophic kidneys without hydronephrosis. Bilateral renal cysts for which no imaging follow-up is recommended. Punctate stones in the kidneys. Bladder is decompressed. Stomach/Bowel: Centralization of the bowel. Nondistended stomach. No dilated  small bowel. Mild diffuse colon wall thickening versus under distension, chronic. No definite acute  inflammatory bowel process. Vascular/Lymphatic: Advanced aortic atherosclerosis. No aneurysm. No suspicious lymph nodes Reproductive: Negative prostate.  Fluid in the scrotum Other: No free air. Large volume abdominopelvic ascites. Generalized subcutaneous edema consistent with anasarca Musculoskeletal: No acute osseous abnormality IMPRESSION: 1. Negative for bowel obstruction or acute bowel inflammatory process. 2. Large volume abdominopelvic ascites. Generalized subcutaneous edema consistent with anasarca. 3. Cardiomegaly with small pericardial effusion. See separately dictated chest CT for additional findings in the chest 4. Atrophic kidneys with punctate stones. No hydronephrosis. 5. Aortic atherosclerosis. Aortic Atherosclerosis (ICD10-I70.0). Electronically Signed   By: Esmeralda Hedge M.D.   On: 10/13/2023 20:27   CT CHEST WO CONTRAST Result Date: 10/13/2023 CLINICAL DATA:  Abnormal xray - lung opacity/opacities. EXAM: CT CHEST WITHOUT CONTRAST TECHNIQUE: Multidetector CT imaging of the chest was performed following the standard protocol without IV contrast. RADIATION DOSE REDUCTION: This exam was performed according to the departmental dose-optimization program which includes automated exposure control, adjustment of the mA and/or kV according to patient size and/or use of iterative reconstruction technique. COMPARISON:  CT angiography chest from 07/05/2023. FINDINGS: Cardiovascular: Mild cardiomegaly. No pericardial effusion. No aortic aneurysm. There are coronary artery calcifications, in keeping with coronary artery disease. There are also mild peripheral atherosclerotic vascular calcifications of thoracic aorta and its major branches. There is dilation of the main pulmonary trunk measuring up to 3.8 cm, which is nonspecific but can be seen with pulmonary artery hypertension. Mediastinum/Nodes: Visualized  thyroid  gland appears grossly unremarkable. No solid / cystic mediastinal masses. The esophagus is nondistended precluding optimal assessment. There is an enlarged right lower paratracheal lymph node measuring 1.2 x 2.0 cm, which appears mildly enlarged since the prior study when it measured up to 0.9 x 1.8 cm. No axillary lymphadenopathy by size criteria. Evaluation of bilateral hila is limited due to lack on intravenous contrast: however, no large hilar lymphadenopathy identified. Lungs/Pleura: The central tracheo-bronchial tree is patent. There are diffuse moderate to severe centrilobular emphysematous changes throughout bilateral lungs. Since the prior study, there is interval increase in the masslike opacity in the right lung lower lobe. The lesion measures at least 6.8 x 9.5 cm, orthogonally on axial plane. The opacity extends across the right major fissure into the posterior segment of right upper lobe. There are new heterogeneous opacities in the right lung lower lobe which may represent postobstructive pneumonia. There is small right pleural effusion, which has increased since the prior study. There are multi segmental atelectatic changes at the left lung lower lobe, which also appears increased in size since the prior study. There is small left pleural effusion which is also slightly increased. There is lobulated approximately 1.2 x 1.2 cm nodule in the left lung lower lobe which appear increased in size since the prior study when it measured up to 8 x 13 mm. Upper Abdomen: There is small-to-moderate ascites. Bilateral small/atrophic kidneys noted. There are several hypoattenuating structures in bilateral kidneys, not well evaluated on the current exam. There is small sliding hiatal hernia. Remaining visualized upper abdominal viscera within normal limits. Musculoskeletal: The visualized soft tissues of the chest wall are grossly unremarkable. No suspicious osseous lesions. Probable old healed fracture of  the mid sternal body noted. There are mild multilevel degenerative changes in the visualized spine. 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 IMPRESSION: 1. Since the prior study, there is interval increase in the masslike opacity in the right lung, which is concerning for malignancy. There are new  heterogeneous opacities in the right lung lower lobe, which may represent postobstructive pneumonia. Pulmonary consultation and further evaluation with contrast-enhanced chest CT scan is recommended. 2. There is also interval increase in the size of left lung lower lobe nodule. 3. There is interval increase in bilateral pleural effusions. 4. There is interval increase in the size of right lower paratracheal lymph node, which may be reactive versus metastatic. 5. Multiple other nonacute observations, as described above. Aortic Atherosclerosis (ICD10-I70.0) and Emphysema (ICD10-J43.9). Electronically Signed   By: Beula Brunswick M.D.   On: 10/13/2023 18:14   DG Chest Portable 1 View Result Date: 10/13/2023 CLINICAL DATA:  Shortness of breath EXAM: PORTABLE CHEST 1 VIEW COMPARISON:  July 05, 2023 FINDINGS: Parenchymal consolidative opacity of the right lung involving particularly right mid and right lower lobe region correlate with pneumonia. The possibility of underlying mass is not excluded due to the round lobular appearance. And findings correlate with the prior CT chest angio July 05, 2023 that demonstrated a round consolidating process of the right lower lobe. Ill-defined left lower lobe infiltrates and atelectasis. Correlation with CT recommended. Heart and mediastinum within normal limits IMPRESSION: *Parenchymal consolidative opacity of the right lung involving particularly right mid and right lower lobe region correlate with pneumonia. The possibility of underlying mass is not excluded due to the round lobular appearance. *Ill-defined left  lower lobe infiltrates and atelectasis. Correlation with CT recommended. Electronically Signed   By: Fredrich Jefferson M.D.   On: 10/13/2023 16:22    PERFORMANCE STATUS (ECOG) : 3 - Symptomatic, >50% confined to bed  Review of Systems Unless otherwise noted, a complete review of systems is negative.  Physical Exam General: Thin, frail-appearing Pulmonary: Unlabored Extremities: no edema, no joint deformities Skin: no rashes Neurological: Weakness, confused  IMPRESSION: Patient with multiple medical problems including ESRD on HD, schizophrenia, O2 dependent COPD, who is a resident of a group home, and admitted to the hospital with postobstructive pneumonia.  CTA of the chest showed a right lower lung mass and also left lung nodule, bilateral pleural effusions, enlargement of the paratracheal lymph nodes.  This was followed by CT of abdomen and pelvis which showed large volume ascites.  Findings concerning for metastatic disease.  I called and spoke with DSS director -[name]. We discussed findings concerning for cancer.  She tells me that patient was told in the past that he possibly could have colorectal cancer.  At that time, patient adamantly declined colonoscopy and was told that he likely would not be a candidate for treatment even if he had cancer.  Similarly, patient is now not felt to be a viable candidate for cancer treatment in light of his multiple other advanced comorbidities.  We discussed option of foregoing further workup and just focusing on his comfort.  DSS plans to send a social worker to the hospital to discuss goals with patient.  Reportedly, patient has no surviving family to be involved in decision making.  Of note, patient was confused today and unclear how much he can meaningfully engage in a conversation regarding goals.  I did discuss hospice involvement with Candace.  She thought that patient had been previously followed by hospice in the past.  However, hospice is  unlikely to follow a patient on active hemodialysis.  Patient may need to return to the group home with palliative care and plan to transition to hospice in the future at time of decline.  Candace tells me that patient is a DNR/DNI.  They  have previously signed DNR orders at the group home.  She agreed for me to change CODE STATUS to reflect DNR today.  PLAN: - Recommend best supportive care - DSS plans to address goals with patient - DNR/DNI  Case and plan discussed with Dr. Valentine Gasmen   Time Total: 50 minutes  Visit consisted of counseling and education dealing with the complex and emotionally intense issues of symptom management and palliative care in the setting of serious and potentially life-threatening illness.Greater than 50%  of this time was spent counseling and coordinating care related to the above assessment and plan.  Signed by: Gerilyn Kobus, PhD, NP-C

## 2023-10-14 NOTE — Consult Note (Signed)
 [facility] CONSULT NOTE  Patient Care Team: Janann Meadow, NP as PCP - General (Family Medicine) Shari Daughters, MD (Internal Medicine)  CHIEF COMPLAINTS/PURPOSE OF CONSULTATION: CT scan concerning for lung cancer  HISTORY OF PRESENTING ILLNESS:  Todd Brown 63 y.o.  male pleasant patient with a medical history significant of f ESRD-HD (MWF), schizophrenia, HTN, HLD, COPD on 2 L oxygen, CAD, hypothyroidism, GERD, remote right leg DVT 2018 not on anticoagulants currently, cardiac arrest, strep bacteremia with negative TEE, blindness, presents with shortness of breath, nausea, vomiting, abdominal pain, abdominal distention.  Patient presents with worsening shortness of breath and also abdominal distention.  Also noted to have diffuse abdominal tenderness on deep palpation.  Further imaging and CT scan in the emergency room showed right lung mass and also adenopathy.  Concerning for malignancy.  Patient also noted to have significant ascites on imaging.  Oncology has been consulted for further evaluation recommendations.    Review of Systems  Unable to perform ROS: Psychiatric disorder    MEDICAL HISTORY:  Past Medical History:  Diagnosis Date   Anemia    Arthritis    Chronic respiratory failure (HCC)    COPD (chronic obstructive pulmonary disease) (HCC)    Dialysis patient (HCC)    Mon. 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   ESRD (end stage renal disease) (HCC)    ESRD (end stage renal disease) on dialysis (HCC)    Hyperlipidemia    Hypertension    Malignant hypertension    Pneumonia 2016   Renal artery stenosis (HCC)    Schizophrenia (HCC)     SURGICAL HISTORY: Past Surgical History:  Procedure Laterality Date   A/V FISTULAGRAM N/A 08/06/2016   Procedure: A/V Fistulagram;  Surgeon: Celso College, MD;  Location: [facility] INVASIVE CV LAB;  Service: Cardiovascular;  Laterality: N/A;   A/V FISTULAGRAM  N/A 02/14/2021   Procedure: A/V FISTULAGRAM poss Perm Cath Insertion;  Surgeon: Jackquelyn Mass, MD;  Location: [facility] INVASIVE CV LAB;  Service: Cardiovascular;  Laterality: N/A;   A/V FISTULAGRAM Left 08/26/2021   Procedure: A/V Fistulagram;  Surgeon: Celso College, MD;  Location: [facility] INVASIVE CV LAB;  Service: Cardiovascular;  Laterality: Left;   A/V SHUNT INTERVENTION N/A 08/06/2016   Procedure: A/V Shunt Intervention;  Surgeon: Celso College, MD;  Location: [facility] INVASIVE CV LAB;  Service: Cardiovascular;  Laterality: N/A;   AV FISTULA PLACEMENT Left 12/26/2014   Procedure: ARTERIOVENOUS (AV) FISTULA CREATION;  Surgeon: Celso College, MD;  Location: [facility] ORS;  Service: Vascular;  Laterality: Left;   AV FISTULA PLACEMENT     ESOPHAGOGASTRODUODENOSCOPY (EGD) WITH PROPOFOL  N/A 05/06/2017   Procedure: ESOPHAGOGASTRODUODENOSCOPY (EGD) WITH PROPOFOL ;  Surgeon: Luke Salaam, MD;  Location: [facility] ENDOSCOPY;  Service: Gastroenterology;  Laterality: N/A;   INSERTION OF DIALYSIS CATHETER Right    PERIPHERAL VASCULAR CATHETERIZATION N/A 10/22/2014   Procedure: Dialysis/Perma Catheter Insertion;  Surgeon: Celso College, MD;  Location: [facility] INVASIVE CV LAB;  Service: Cardiovascular;  Laterality: N/A;   PERIPHERAL VASCULAR CATHETERIZATION N/A 02/28/2015   Procedure: Dialysis/Perma Catheter Removal;  Surgeon: Celso College, MD;  Location: [facility] INVASIVE CV LAB;  Service: Cardiovascular;  Laterality: N/A;   Repair fx left lower leg     yrs ago (age 45)   REVISON OF ARTERIOVENOUS FISTULA Left 03/27/2022   Procedure: REVISON OF ARTERIOVENOUS FISTULA (JUMP GRAFT);  Surgeon: Jackquelyn Mass, MD;  Location: [facility]  ORS;  Service: Vascular;  Laterality: Left;   TEE WITHOUT CARDIOVERSION N/A 09/21/2022   Procedure: TRANSESOPHAGEAL ECHOCARDIOGRAM;  Surgeon: Constancia Delton, MD;  Location: [facility] ORS;  Service: Cardiovascular;  Laterality: N/A;   TONSILLECTOMY      SOCIAL HISTORY: Social History   Socioeconomic History    Marital status: Single    Spouse name: Not on file   Number of children: 0   Years of education: Not on file   Highest education level: Not on file  Occupational History   Occupation: Disability   Tobacco Use   Smoking status: Never    Passive exposure: Never   Smokeless tobacco: Never  Vaping Use   Vaping status: Never Used  Substance and Sexual Activity   Alcohol use: Not Currently   Drug use: Not Currently    Types: Marijuana    Comment: not since living at assisted living place 03/2020   Sexual activity: Not Currently  Other Topics Concern   Not on file  Social History Narrative   ** Merged History Encounter **       Living at [facility] assisted living [place], [place] [employer] [employer] is legal guardian    Social Drivers of Health   Financial Resource Strain: Low Risk  (10/08/2018)   Overall Financial Resource Strain (CARDIA)    Difficulty of Paying Living Expenses: Not hard at all  Food Insecurity: No Food Insecurity (10/14/2023)   Hunger Vital Sign    Worried About Running Out of Food in the Last Year: Never true    Ran Out of Food in the Last Year: Never true  Transportation Needs: Patient Unable To Answer (10/14/2023)   PRAPARE - Transportation    Lack of Transportation (Medical): Patient unable to answer    Lack of Transportation (Non-Medical): Patient unable to answer  Recent Concern: Transportation Needs - Unmet Transportation Needs (10/13/2023)   PRAPARE - Transportation    Lack of Transportation (Medical): Yes    Lack of Transportation (Non-Medical): Patient unable to answer  Physical Activity: Unknown (10/08/2018)   Exercise Vital Sign    Days of Exercise per Week: Patient declined    Minutes of Exercise per Session: Patient declined  Stress: No Stress Concern Present (10/08/2018)   [employer] of Occupational Health - Occupational Stress Questionnaire    Feeling of Stress : Not at all  Social Connections: Unknown (10/08/2018)   Social  Connection and Isolation Panel [NHANES]    Frequency of Communication with Friends and Family: Patient declined    Frequency of Social Gatherings with Friends and Family: Patient declined    Attends Religious Services: Patient declined    Database administrator or Organizations: Patient declined    Attends Banker Meetings: Patient declined    Marital Status: Patient declined  Intimate Partner Violence: Not At Risk (10/13/2023)   Humiliation, Afraid, Rape, and Kick questionnaire    Fear of Current or Ex-Partner: No    Emotionally Abused: No    Physically Abused: No    Sexually Abused: No    FAMILY HISTORY: Family History  Problem Relation Age of Onset   Kidney disease Brother    Diabetes Neg Hx     ALLERGIES:  is allergic to chlorpromazine.  MEDICATIONS:  No current facility-administered medications for this encounter.   Current Outpatient Medications  Medication Sig Dispense Refill   albuterol  (VENTOLIN  HFA) 108 (90 Base) MCG/ACT inhaler Inhale 1-2 puffs into the lungs every 4 (four) hours as needed for  wheezing or shortness of breath.     ascorbic acid  (VITAMIN C ) 500 MG tablet Take 500 mg by mouth daily.     b complex-vitamin c -folic acid  (NEPHRO-VITE) 0.8 MG TABS tablet Take 1 tablet by mouth at bedtime.     benzonatate  (TESSALON ) 100 MG capsule Take 100 mg by mouth every 6 (six) hours as needed.     carbamazepine  (TEGRETOL ) 200 MG tablet Take 1,000 mg by mouth every morning.     cholecalciferol  (VITAMIN D ) 1000 UNITS tablet Take 1,000 Units by mouth in the morning.     ferrous sulfate  325 (65 FE) MG tablet Take 1 tablet (325 mg total) by mouth daily. 30 tablet 3   Fluticasone -Salmeterol (ADVAIR) 250-50 MCG/DOSE AEPB Inhale 1 puff into the lungs 2 (two) times daily.     hyoscyamine  (LEVSIN SL) 0.125 MG SL tablet Place 0.125 mg under the tongue every 4 (four) hours as needed for cramping.     ipratropium-albuterol  (DUONEB) 0.5-2.5 (3) MG/3ML SOLN Take 3 mLs by  nebulization every 6 (six) hours as needed. 360 mL 0   isosorbide  mononitrate (IMDUR ) 60 MG 24 hr tablet Take 1 tablet (60 mg total) by mouth daily.     levothyroxine  (SYNTHROID ) 125 MCG tablet Take 1 tablet (125 mcg total) by mouth daily. 30 tablet 0   LORazepam  (ATIVAN ) 0.5 MG tablet Take 0.5 mg by mouth every 4 (four) hours as needed for anxiety or sleep (agitation, or restlessness).     nicotine  (NICODERM CQ  - DOSED IN MG/24 HOURS) 21 mg/24hr patch Place 1 patch (21 mg total) onto the skin daily. 28 patch 0   omeprazole  (PRILOSEC) 40 MG capsule Take 40 mg by mouth every morning.     polyethylene glycol (MIRALAX  / GLYCOLAX ) 17 g packet Take 17 g by mouth daily as needed for mild constipation.     rosuvastatin  (CRESTOR ) 10 MG tablet Take 10 mg by mouth at bedtime.     senna-docusate (SENOKOT-S) 8.6-50 MG tablet Take 1 tablet by mouth 2 (two) times daily as needed for mild constipation.     traMADol  (ULTRAM ) 50 MG tablet Take 50 mg by mouth every 6 (six) hours as needed for moderate pain (pain score 4-6).     trihexyphenidyl  (ARTANE ) 2 MG tablet Take 2 mg by mouth 2 (two) times daily.     zinc  sulfate 220 (50 Zn) MG capsule Take 220 mg by mouth daily.     [START ON 10/20/2023] amoxicillin -clavulanate (AUGMENTIN ) 500-125 MG tablet Take 1 tablet by mouth daily for 3 days. 3 tablet 0    PHYSICAL EXAMINATION:   Vitals:   10/19/23 0753 10/19/23 1525  BP: (!) 144/81 124/82  Pulse: 81 92  Resp: 18 19  Temp: 97.6 F (36.4 C) 97.8 F (36.6 C)  SpO2: 94% 93%   Filed Weights   10/18/23 1519 10/18/23 1949 10/19/23 0500  Weight: 168 lb 10.4 oz (76.5 kg) 164 lb 10.9 oz (74.7 kg) 164 lb 7.4 oz (74.6 kg)    Physical Exam Vitals and nursing note reviewed.  HENT:     Head: Normocephalic and atraumatic.     Mouth/Throat:     Pharynx: Oropharynx is clear.  Eyes:     Extraocular Movements: Extraocular movements intact.     Pupils: Pupils are equal, round, and reactive to light.  Cardiovascular:      Rate and Rhythm: Normal rate and regular rhythm.  Pulmonary:     Comments: Decreased breath sounds bilaterally.  Abdominal:  Palpations: Abdomen is soft.  Musculoskeletal:        General: Normal range of motion.     Cervical back: Normal range of motion.  Skin:    General: Skin is warm.  Neurological:     General: No focal deficit present.     Mental Status: He is disoriented.     LABORATORY DATA:  I have reviewed the data as listed Lab Results  Component Value Date   WBC 6.6 10/19/2023   HGB 13.0 10/19/2023   HCT 38.6 (L) 10/19/2023   MCV 98.5 10/19/2023   PLT 235 10/19/2023   Recent Labs    11/03/22 0936 11/04/22 0814 10/17/23 0418 10/18/23 0442 10/19/23 0342  NA 139   < > 136 135 133*  K 4.3   < > 4.7 5.3* 4.3  CL 95*   < > 95* 97* 97*  CO2 28   < > 26 27 27   GLUCOSE 88   < > 77 81 90  BUN 27*   < > 38* 51* 35*  CREATININE 5.66*   < > 6.29* 7.27* 5.39*  CALCIUM  9.4   < > 9.3 9.1 8.6*  GFRNONAA 11*   < > 9* 8* 11*  PROT 7.3   < > 7.1 6.5 5.7*  ALBUMIN  3.6   < > 3.1* 2.7* 2.5*  AST 31   < > 23 20 30   ALT 6   < > 13 14 17   ALKPHOS 63   < > 49 44 48  BILITOT 0.8   < > 1.0 0.9 0.7  BILIDIR 0.1  --   --   --   --   IBILI 0.7  --   --   --   --    < > = values in this interval not displayed.    RADIOGRAPHIC STUDIES: I have personally reviewed the radiological images as listed and agreed with the findings in the report. US  Paracentesis Result Date: 10/18/2023 INDICATION: 63 year old male with history of end-stage renal disease, with recurrent ascites. Patient has requested for therapeutic paracentesis. EXAM: ULTRASOUND GUIDED THERAPEUTIC PARACENTESIS MEDICATIONS: 7 cc of 1% lidocaine  COMPLICATIONS: None immediate. PROCEDURE: Informed written consent was obtained from the patient after a discussion of the risks, benefits and alternatives to treatment. A timeout was performed prior to the initiation of the procedure. Initial ultrasound scanning demonstrates a  large amount of ascites within the right lower abdominal quadrant. The right lower abdomen was prepped and draped in the usual sterile fashion. 1% lidocaine  was used for local anesthesia. Following this, a 19 gauge, 7-cm, Yueh catheter was introduced. An ultrasound image was saved for documentation purposes. The paracentesis was performed. The catheter was removed and a dressing was applied. The patient tolerated the procedure well without immediate post procedural complication. Patient received post-procedure intravenous albumin ; see nursing notes for details. FINDINGS: A total of approximately 6.0 L of clear, amber colored peritoneal fluid was removed. IMPRESSION: Successful ultrasound-guided paracentesis yielding 6.0 liters of peritoneal fluid. Procedure performed by Lambert Pillion, PA-C Carim, PA-C Electronically Signed   By: Myrlene Asper D.O.   On: 10/18/2023 15:33   CT ABDOMEN PELVIS WO CONTRAST Result Date: 10/13/2023 CLINICAL DATA:  Nausea vomiting and short of breath EXAM: CT ABDOMEN AND PELVIS WITHOUT CONTRAST TECHNIQUE: Multidetector CT imaging of the abdomen and pelvis was performed following the standard protocol without IV contrast. RADIATION DOSE REDUCTION: This exam was performed according to the departmental dose-optimization program which includes automated exposure control, adjustment of  the mA and/or kV according to patient size and/or use of iterative reconstruction technique. COMPARISON:  CT 11/03/2022, chest CT 10/13/2023 FINDINGS: Lower chest: See separately dictated chest CT. Partially visualized mass/masslike consolidation in the right lower lobe. Partial left lower lobe consolidation. Bilateral pleural effusions. Cardiomegaly. Coronary vascular calcification. Small pericardial effusion Hepatobiliary: No calcified gallstone. No focal hepatic abnormality or biliary dilatation Pancreas: Unremarkable. No pancreatic ductal dilatation or surrounding inflammatory changes. Spleen: Normal in  size without focal abnormality. Adrenals/Urinary Tract: Enlarged thickened adrenal glands as before. 2.3 cm suspected left adrenal adenoma for which no imaging follow-up is recommended. Atrophic kidneys without hydronephrosis. Bilateral renal cysts for which no imaging follow-up is recommended. Punctate stones in the kidneys. Bladder is decompressed. Stomach/Bowel: Centralization of the bowel. Nondistended stomach. No dilated small bowel. Mild diffuse colon wall thickening versus under distension, chronic. No definite acute inflammatory bowel process. Vascular/Lymphatic: Advanced aortic atherosclerosis. No aneurysm. No suspicious lymph nodes Reproductive: Negative prostate.  Fluid in the scrotum Other: No free air. Large volume abdominopelvic ascites. Generalized subcutaneous edema consistent with anasarca Musculoskeletal: No acute osseous abnormality IMPRESSION: 1. Negative for bowel obstruction or acute bowel inflammatory process. 2. Large volume abdominopelvic ascites. Generalized subcutaneous edema consistent with anasarca. 3. Cardiomegaly with small pericardial effusion. See separately dictated chest CT for additional findings in the chest 4. Atrophic kidneys with punctate stones. No hydronephrosis. 5. Aortic atherosclerosis. Aortic Atherosclerosis (ICD10-I70.0). Electronically Signed   By: Esmeralda Hedge M.D.   On: 10/13/2023 20:27   CT CHEST WO CONTRAST Result Date: 10/13/2023 CLINICAL DATA:  Abnormal xray - lung opacity/opacities. EXAM: CT CHEST WITHOUT CONTRAST TECHNIQUE: Multidetector CT imaging of the chest was performed following the standard protocol without IV contrast. RADIATION DOSE REDUCTION: This exam was performed according to the departmental dose-optimization program which includes automated exposure control, adjustment of the mA and/or kV according to patient size and/or use of iterative reconstruction technique. COMPARISON:  CT angiography chest from 07/05/2023. FINDINGS: Cardiovascular:  Mild cardiomegaly. No pericardial effusion. No aortic aneurysm. There are coronary artery calcifications, in keeping with coronary artery disease. There are also mild peripheral atherosclerotic vascular calcifications of thoracic aorta and its major branches. There is dilation of the main pulmonary trunk measuring up to 3.8 cm, which is nonspecific but can be seen with pulmonary artery hypertension. Mediastinum/Nodes: Visualized thyroid  gland appears grossly unremarkable. No solid / cystic mediastinal masses. The esophagus is nondistended precluding optimal assessment. There is an enlarged right lower paratracheal lymph node measuring 1.2 x 2.0 cm, which appears mildly enlarged since the prior study when it measured up to 0.9 x 1.8 cm. No axillary lymphadenopathy by size criteria. Evaluation of bilateral hila is limited due to lack on intravenous contrast: however, no large hilar lymphadenopathy identified. Lungs/Pleura: The central tracheo-bronchial tree is patent. There are diffuse moderate to severe centrilobular emphysematous changes throughout bilateral lungs. Since the prior study, there is interval increase in the masslike opacity in the right lung lower lobe. The lesion measures at least 6.8 x 9.5 cm, orthogonally on axial plane. The opacity extends across the right major fissure into the posterior segment of right upper lobe. There are new heterogeneous opacities in the right lung lower lobe which may represent postobstructive pneumonia. There is small right pleural effusion, which has increased since the prior study. There are multi segmental atelectatic changes at the left lung lower lobe, which also appears increased in size since the prior study. There is small left pleural effusion which is also slightly increased. There is  lobulated approximately 1.2 x 1.2 cm nodule in the left lung lower lobe which appear increased in size since the prior study when it measured up to 8 x 13 mm. Upper Abdomen: There  is small-to-moderate ascites. Bilateral small/atrophic kidneys noted. There are several hypoattenuating structures in bilateral kidneys, not well evaluated on the current exam. There is small sliding hiatal hernia. Remaining visualized upper abdominal viscera within normal limits. Musculoskeletal: The visualized soft tissues of the chest wall are grossly unremarkable. No suspicious osseous lesions. Probable old healed fracture of the mid sternal body noted. There are mild multilevel degenerative changes in the visualized spine. 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 IMPRESSION: 1. Since the prior study, there is interval increase in the masslike opacity in the right lung, which is concerning for malignancy. There are new heterogeneous opacities in the right lung lower lobe, which may represent postobstructive pneumonia. Pulmonary consultation and further evaluation with contrast-enhanced chest CT scan is recommended. 2. There is also interval increase in the size of left lung lower lobe nodule. 3. There is interval increase in bilateral pleural effusions. 4. There is interval increase in the size of right lower paratracheal lymph node, which may be reactive versus metastatic. 5. Multiple other nonacute observations, as described above. Aortic Atherosclerosis (ICD10-I70.0) and Emphysema (ICD10-J43.9). Electronically Signed   By: Beula Brunswick M.D.   On: 10/13/2023 18:14   DG Chest Portable 1 View Result Date: 10/13/2023 CLINICAL DATA:  Shortness of breath EXAM: PORTABLE CHEST 1 VIEW COMPARISON:  July 05, 2023 FINDINGS: Parenchymal consolidative opacity of the right lung involving particularly right mid and right lower lobe region correlate with pneumonia. The possibility of underlying mass is not excluded due to the round lobular appearance. And findings correlate with the prior CT chest angio July 05, 2023 that demonstrated a round  consolidating process of the right lower lobe. Ill-defined left lower lobe infiltrates and atelectasis. Correlation with CT recommended. Heart and mediastinum within normal limits IMPRESSION: *Parenchymal consolidative opacity of the right lung involving particularly right mid and right lower lobe region correlate with pneumonia. The possibility of underlying mass is not excluded due to the round lobular appearance. *Ill-defined left lower lobe infiltrates and atelectasis. Correlation with CT recommended. Electronically Signed   By: Fredrich Jefferson M.D.   On: 10/13/2023 16:22     No problem-specific Assessment & Plan notes found for this encounter.  63 year old male patient with multiple medical problems including ESRD-HD (MWF), schizophrenia, HTN, HLD, COPD on 2 L oxygen, CAD, hypothyroidism, GERD, remote right leg DVT 2018 not on anticoagulants currently, cardiac arrest, blindness-presents to the hospital worsening shortness of breath cough and also abdominal distention.  CT scan chest concerning for right lung cancer and also diffuse ascites  # CT scan findings concerning for lung cancer-right lower lobe along with adenopathy.  # CT abdominal-diffuse ascites present concerning for malignancy versus hepatic causes.  # Schizophrenia  # Multiple medical problems including end-stage renal failure on dialysis  Recommendations/plan:  # Given the concerns for malignancy based on imaging-however in context of patient's poor health overall-I would not recommend further workup, as I do not think patient is a candidate for any therapies.  I reached out to patient's state appointed healthcare-power of attorney.  Left a voicemail.   # Discussed with palliative care John Borders.  Also discussed with primary attending.  Thank you for allowing me to participate in the care of your pleasant patient. Please  do not hesitate to contact me with questions or concerns in the interim.    Gwyn Leos,  MD 10/19/2023 8:57 PM

## 2023-10-14 NOTE — Progress Notes (Signed)
 Hemodialysis Note:  Received patient in bed to unit. Alert and oriented. Patient is visually impaired. Informed consent singed by two nurses and in chart.  Treatment initiated: 1342 Treatment completed: 1758  Access used: Left AVF Access issues: None  Patient tolerated well. Transported back to room, alert without acute distress. Report given to patient's RN.  Total UF removed: 2.5 Liters Medications given: None  Post HD weight: 78.6 Kg  [facility]

## 2023-10-14 NOTE — Progress Notes (Signed)
 PROGRESS NOTE    Todd Brown  WGN:[id number] DOB: [birth year] DOA: 10/13/2023 PCP: Janann Meadow, NP  No chief complaint on file.   Hospital Course:  Todd Brown is a 63 year old male with ESRD-HD MWF, schizophrenia, hypertension, hyperlipidemia, COPD with chronic hypoxic respiratory failure on 2 L, CAD, recurrent ascites requiring prior paracentesis, hypothyroidism, GERD, remote right leg DVT 2018 not on AC, prior cardiac arrest, prior strep bacteremia with negative TEE, blindness, who presents on this admission from his group home complaining of shortness of breath, nausea, vomiting, abdominal pain and distention.  Patient missed his dialysis on 5/16 due to abdominal pain.  In the ED he was found to have increasing O2 requirement, to 4 L.  Labs consistent with ESRD.  Blood pressure 89/63, heart rate 74, respiratory rate 25.  Chest x-ray revealed right lung opacity and left lower lobe infiltrates.  CT chest confirms interval increase in masslike opacity in right lung most concerning for malignancy as well as new heterogenous opacities in the right lower lobe which could represent postobstructive pneumonia.  There is also an interval increase in the left lung lower lobe nodule and bilateral pleural effusions.  Patient was admitted for further workup.  Oncology was consulted.  Subjective: This morning patient is asking for food.  It is his only concern. When discussing CT scan findings patient speech is incomprehensible.  Unclear if he is understanding the findings. Remains focused on food.    Objective: Vitals:   10/13/23 2217 10/13/23 2313 10/14/23 0500 10/14/23 0745  BP: 101/74   (!) 155/93  Pulse: 73   79  Resp: 18   17  Temp: 97.8 F (36.6 C)   98.7 F (37.1 C)  TempSrc: Oral     SpO2: 94% 94%  95%  Weight:   83.4 kg   Height:        Intake/Output Summary (Last 24 hours) at 10/14/2023 0833 Last data filed at 10/14/2023 0820 Gross per 24 hour  Intake 140.99 ml  Output  --  Net 140.99 ml   Filed Weights   10/13/23 2020 10/14/23 0500  Weight: 67.6 kg 83.4 kg    Examination: General exam: Appears calm and comfortable, NAD  Respiratory system: No work of breathing, symmetric chest wall expansion Cardiovascular system: S1 & S2 heard, RRR.  Gastrointestinal system: Abdomen is distended, nontender. Neuro: Alert, speech unintelligible at times, answer questions consistently.  He requires prompting. Extremities: Symmetric, expected ROM Skin: No rashes, lesions Psychiatry: Difficult to assess.  Calm.  Assessment & Plan:  Principal Problem:   Postobstructive pneumonia Active Problems:   Lung mass   COPD (chronic obstructive pulmonary disease) (HCC)   Abdominal pain   Ascites   ESRD on hemodialysis (HCC)   HLD (hyperlipidemia)   Essential hypertension   CAD (coronary artery disease)   Hypothyroidism   Schizophrenia (HCC)   Blindness of both eyes    Postobstructive pneumonia Acute on chronic hypoxic respiratory failure - Baseline O2 requirement 2 L, patient currently requiring 4 L, tachypneic on arrival. - Patient is without fever or leukocytosis.  Sepsis ruled out - Obstruction secondary to mass, likely malignancy - No large hilar lymphadenopathy - Currently on vancomycin  and Zosyn.  MRSA PCR negative.  Discontinue vancomycin  - Continue with Zosyn for now - Continue with incentive spirometry - Symptomatic support Mucinex  and bronchodilators - Legionella and strep pneumo antigen: pending - Blood cultures and sputum cultures taken, will follow - Respiratory panel negative  Lung mass - Seen on  CT with interval increase in size, 6.8 x 9.5 cm extending across the right major fissure into the posterior segment of the right upper lung - Oncology has been consulted.  Suspect that this is malignancy and recommends palliative care - Palliative care has been consulted, appreciate their assistance with GOC - Given patient's extensive comorbidities he  is a poor candidate for aggressive chemotherapy or surgical resection - On discussion with patient's DSS guardian, it appears that patient has previously declined cancer therapy for possible colorectal cancer.  She plans to come to the hospital for in person visit with the patient and have discussion regarding work up/treatment plan. - Patient may require hospice services outpatient but given his reliance on hemodialysis may benefit from palliative care services instead.  Pending further discussions with Guardian  Bilateral pleural effusions - Small.  May improve with hemodialysis - Complicated by infection and malignancy  COPD - Continue with bronchodilators and Mucinex  as above - Does not appear to be wheezing on exam.  Will hold off on steroids at this time  Abdominal ascites - Patient with abdominal distention and pain.  CT abdomen pelvis with large volume ascites and subcutaneous edema consistent with anasarca - Have ordered for paracentesis, received albumin this morning. - Ammonia level: WNL  History of hepatitis C. - Chronic, unclear treatment.  Has required paracentesis in the past - LFTs within normal limits.  Ammonia unremarkable - Does have significant ascites. - Paracentesis ordered as above  ESRD on HD MWF - Nephrology consulted for dialysis during this admission - Monitor electrolytes closely.  Currently stable.  Schizophrenia - Resume home meds  Hypertension - Monitor closely, titrate as needed - Resume home meds  Hyperlipidemia - Statin  Hypothyroidism - Resume home dose levothyroxine   CAD - Stable.  Resume home meds  GERD - PPI  Blindness  Resides at a group home - Legal guardian to visit and discuss prognosis with patient  Adrenal adenoma - 2.3 cm suspected left adrenal adenoma seen on CT.  No follow-up imaging recommended  DVT prophylaxis: Heparin    Code Status: Full Code Disposition: Inpatient, prognosis guarded.  Workup pending.  Patient  has a legal guardian.  She plans to make visit to the hospital for further discussions with the patient regarding goals of care  Consultants:    Procedures:    Antimicrobials:  Anti-infectives (From admission, onward)    Start     Dose/Rate Route Frequency Ordered Stop   10/13/23 2345  vancomycin  (VANCOREADY) IVPB 1500 mg/300 mL        1,500 mg 150 mL/hr over 120 Minutes Intravenous  Once 10/13/23 2256 10/14/23 0124   10/13/23 2100  piperacillin-tazobactam (ZOSYN) IVPB 2.25 g        2.25 g 100 mL/hr over 30 Minutes Intravenous Every 8 hours 10/13/23 2024     10/13/23 2030  vancomycin  (VANCOREADY) IVPB 1500 mg/300 mL  Status:  Discontinued        1,500 mg 150 mL/hr over 120 Minutes Intravenous  Once 10/13/23 2024 10/13/23 2256   10/13/23 2024  vancomycin  variable dose per unstable renal function (pharmacist dosing)         Does not apply See admin instructions 10/13/23 2024     10/13/23 1830  cefTRIAXone  (ROCEPHIN ) 1 g in sodium chloride  0.9 % 100 mL IVPB  Status:  Discontinued        1 g 200 mL/hr over 30 Minutes Intravenous  Once 10/13/23 1828 10/13/23 2024   10/13/23 1830  azithromycin  (  ZITHROMAX ) 500 mg in sodium chloride  0.9 % 250 mL IVPB  Status:  Discontinued        500 mg 250 mL/hr over 60 Minutes Intravenous  Once 10/13/23 1828 10/13/23 2024       Data Reviewed: I have personally reviewed following labs and imaging studies CBC: Recent Labs  Lab 10/13/23 1620 10/14/23 0610  WBC 6.3 6.1  HGB 13.9 12.9*  HCT 41.9 37.1*  MCV 102.2* 98.9  PLT 223 204   Basic Metabolic Panel: Recent Labs  Lab 10/13/23 1620 10/14/23 0610  NA 135 135  K 4.0 4.0  CL 95* 95*  CO2 26 26  GLUCOSE 97 85  BUN 39* 45*  CREATININE 7.61* 8.26*  CALCIUM  9.3 8.8*   GFR: Estimated Creatinine Clearance: 10.9 mL/min (A) (by C-G formula based on SCr of 8.26 mg/dL (H)). Liver Function Tests: Recent Labs  Lab 10/13/23 1620  AST 27  ALT 9  ALKPHOS 51  BILITOT 0.9  PROT 7.1  ALBUMIN  3.2*   CBG: No results for input(s): "GLUCAP" in the last 168 hours.  Recent Results (from the past 240 hours)  MRSA Next Gen by PCR, Nasal     Status: None   Collection Time: 10/13/23 11:09 PM   Specimen: Nasal Mucosa; Nasal Swab  Result Value Ref Range Status   MRSA by PCR Next Gen NOT DETECTED NOT DETECTED Final    Comment: (NOTE) The GeneXpert MRSA Assay (FDA approved for NASAL specimens only), is one component of a comprehensive MRSA colonization surveillance program. It is not intended to diagnose MRSA infection nor to guide or monitor treatment for MRSA infections. Test performance is not FDA approved in patients less than 36 years old. Performed at [facility], [address] [place], [place] [address]   Resp panel by RT-PCR (RSV, Flu A&B, Covid)     Status: None   Collection Time: 10/14/23  2:41 AM  Result Value Ref Range Status   SARS Coronavirus 2 by RT PCR NEGATIVE NEGATIVE Final    Comment: (NOTE) SARS-CoV-2 target nucleic acids are NOT DETECTED.  The SARS-CoV-2 RNA is generally detectable in upper respiratory specimens during the acute phase of infection. The lowest concentration of SARS-CoV-2 viral copies this assay can detect is 138 copies/mL. A negative result does not preclude SARS-Cov-2 infection and should not be used as the sole basis for treatment or other patient management decisions. A negative result may occur with  improper specimen collection/handling, submission of specimen other than nasopharyngeal swab, presence of viral mutation(s) within the areas targeted by this assay, and inadequate number of viral copies(<138 copies/mL). A negative result must be combined with clinical observations, patient history, and epidemiological information. The expected result is Negative.  Fact Sheet for Patients:  BloggerCourse.com  Fact Sheet for Healthcare Providers:  SeriousBroker.it  This test  is no t yet approved or cleared by the [place]  FDA and  has been authorized for detection and/or diagnosis of SARS-CoV-2 by FDA under an Emergency Use Authorization (EUA). This EUA will remain  in effect (meaning this test can be used) for the duration of the COVID-19 declaration under Section 564(b)(1) of the Act, 21 U.S.C.section 360bbb-3(b)(1), unless the authorization is terminated  or revoked sooner.       Influenza A by PCR NEGATIVE NEGATIVE Final   Influenza B by PCR NEGATIVE NEGATIVE Final    Comment: (NOTE) The Xpert Xpress SARS-CoV-2/FLU/RSV plus assay is intended as an aid in the diagnosis of influenza  from Nasopharyngeal swab specimens and should not be used as a sole basis for treatment. Nasal washings and aspirates are unacceptable for Xpert Xpress SARS-CoV-2/FLU/RSV testing.  Fact Sheet for Patients: BloggerCourse.com  Fact Sheet for Healthcare Providers: SeriousBroker.it  This test is not yet approved or cleared by the [place]  FDA and has been authorized for detection and/or diagnosis of SARS-CoV-2 by FDA under an Emergency Use Authorization (EUA). This EUA will remain in effect (meaning this test can be used) for the duration of the COVID-19 declaration under Section 564(b)(1) of the Act, 21 U.S.C. section 360bbb-3(b)(1), unless the authorization is terminated or revoked.     Resp Syncytial Virus by PCR NEGATIVE NEGATIVE Final    Comment: (NOTE) Fact Sheet for Patients: BloggerCourse.com  Fact Sheet for Healthcare Providers: SeriousBroker.it  This test is not yet approved or cleared by the [place]  FDA and has been authorized for detection and/or diagnosis of SARS-CoV-2 by FDA under an Emergency Use Authorization (EUA). This EUA will remain in effect (meaning this test can be used) for the duration of the COVID-19 declaration under Section  564(b)(1) of the Act, 21 U.S.C. section 360bbb-3(b)(1), unless the authorization is terminated or revoked.  Performed at [facility], [address] [place], [place] [address]      Radiology Studies: CT ABDOMEN PELVIS WO CONTRAST Result Date: 10/13/2023 CLINICAL DATA:  Nausea vomiting and short of breath EXAM: CT ABDOMEN AND PELVIS WITHOUT CONTRAST TECHNIQUE: Multidetector CT imaging of the abdomen and pelvis was performed following the standard protocol without IV contrast. RADIATION DOSE REDUCTION: This exam was performed according to the departmental dose-optimization program which includes automated exposure control, adjustment of the mA and/or kV according to patient size and/or use of iterative reconstruction technique. COMPARISON:  CT 11/03/2022, chest CT 10/13/2023 FINDINGS: Lower chest: See separately dictated chest CT. Partially visualized mass/masslike consolidation in the right lower lobe. Partial left lower lobe consolidation. Bilateral pleural effusions. Cardiomegaly. Coronary vascular calcification. Small pericardial effusion Hepatobiliary: No calcified gallstone. No focal hepatic abnormality or biliary dilatation Pancreas: Unremarkable. No pancreatic ductal dilatation or surrounding inflammatory changes. Spleen: Normal in size without focal abnormality. Adrenals/Urinary Tract: Enlarged thickened adrenal glands as before. 2.3 cm suspected left adrenal adenoma for which no imaging follow-up is recommended. Atrophic kidneys without hydronephrosis. Bilateral renal cysts for which no imaging follow-up is recommended. Punctate stones in the kidneys. Bladder is decompressed. Stomach/Bowel: Centralization of the bowel. Nondistended stomach. No dilated small bowel. Mild diffuse colon wall thickening versus under distension, chronic. No definite acute inflammatory bowel process. Vascular/Lymphatic: Advanced aortic atherosclerosis. No aneurysm. No suspicious lymph nodes Reproductive:  Negative prostate.  Fluid in the scrotum Other: No free air. Large volume abdominopelvic ascites. Generalized subcutaneous edema consistent with anasarca Musculoskeletal: No acute osseous abnormality IMPRESSION: 1. Negative for bowel obstruction or acute bowel inflammatory process. 2. Large volume abdominopelvic ascites. Generalized subcutaneous edema consistent with anasarca. 3. Cardiomegaly with small pericardial effusion. See separately dictated chest CT for additional findings in the chest 4. Atrophic kidneys with punctate stones. No hydronephrosis. 5. Aortic atherosclerosis. Aortic Atherosclerosis (ICD10-I70.0). Electronically Signed   By: Esmeralda Hedge M.D.   On: 10/13/2023 20:27   CT CHEST WO CONTRAST Result Date: 10/13/2023 CLINICAL DATA:  Abnormal xray - lung opacity/opacities. EXAM: CT CHEST WITHOUT CONTRAST TECHNIQUE: Multidetector CT imaging of the chest was performed following the standard protocol without IV contrast. RADIATION DOSE REDUCTION: This exam was performed according to the departmental dose-optimization program which includes automated exposure control, adjustment of  the mA and/or kV according to patient size and/or use of iterative reconstruction technique. COMPARISON:  CT angiography chest from 07/05/2023. FINDINGS: Cardiovascular: Mild cardiomegaly. No pericardial effusion. No aortic aneurysm. There are coronary artery calcifications, in keeping with coronary artery disease. There are also mild peripheral atherosclerotic vascular calcifications of thoracic aorta and its major branches. There is dilation of the main pulmonary trunk measuring up to 3.8 cm, which is nonspecific but can be seen with pulmonary artery hypertension. Mediastinum/Nodes: Visualized thyroid  gland appears grossly unremarkable. No solid / cystic mediastinal masses. The esophagus is nondistended precluding optimal assessment. There is an enlarged right lower paratracheal lymph node measuring 1.2 x 2.0 cm, which  appears mildly enlarged since the prior study when it measured up to 0.9 x 1.8 cm. No axillary lymphadenopathy by size criteria. Evaluation of bilateral hila is limited due to lack on intravenous contrast: however, no large hilar lymphadenopathy identified. Lungs/Pleura: The central tracheo-bronchial tree is patent. There are diffuse moderate to severe centrilobular emphysematous changes throughout bilateral lungs. Since the prior study, there is interval increase in the masslike opacity in the right lung lower lobe. The lesion measures at least 6.8 x 9.5 cm, orthogonally on axial plane. The opacity extends across the right major fissure into the posterior segment of right upper lobe. There are new heterogeneous opacities in the right lung lower lobe which may represent postobstructive pneumonia. There is small right pleural effusion, which has increased since the prior study. There are multi segmental atelectatic changes at the left lung lower lobe, which also appears increased in size since the prior study. There is small left pleural effusion which is also slightly increased. There is lobulated approximately 1.2 x 1.2 cm nodule in the left lung lower lobe which appear increased in size since the prior study when it measured up to 8 x 13 mm. Upper Abdomen: There is small-to-moderate ascites. Bilateral small/atrophic kidneys noted. There are several hypoattenuating structures in bilateral kidneys, not well evaluated on the current exam. There is small sliding hiatal hernia. Remaining visualized upper abdominal viscera within normal limits. Musculoskeletal: The visualized soft tissues of the chest wall are grossly unremarkable. No suspicious osseous lesions. Probable old healed fracture of the mid sternal body noted. There are mild multilevel degenerative changes in the visualized spine. 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 IMPRESSION: 1. Since  the prior study, there is interval increase in the masslike opacity in the right lung, which is concerning for malignancy. There are new heterogeneous opacities in the right lung lower lobe, which may represent postobstructive pneumonia. Pulmonary consultation and further evaluation with contrast-enhanced chest CT scan is recommended. 2. There is also interval increase in the size of left lung lower lobe nodule. 3. There is interval increase in bilateral pleural effusions. 4. There is interval increase in the size of right lower paratracheal lymph node, which may be reactive versus metastatic. 5. Multiple other nonacute observations, as described above. Aortic Atherosclerosis (ICD10-I70.0) and Emphysema (ICD10-J43.9). Electronically Signed   By: Beula Brunswick M.D.   On: 10/13/2023 18:14   DG Chest Portable 1 View Result Date: 10/13/2023 CLINICAL DATA:  Shortness of breath EXAM: PORTABLE CHEST 1 VIEW COMPARISON:  July 05, 2023 FINDINGS: Parenchymal consolidative opacity of the right lung involving particularly right mid and right lower lobe region correlate with pneumonia. The possibility of underlying mass is not excluded due to the round lobular appearance. And findings correlate with the prior CT chest  angio July 05, 2023 that demonstrated a round consolidating process of the right lower lobe. Ill-defined left lower lobe infiltrates and atelectasis. Correlation with CT recommended. Heart and mediastinum within normal limits IMPRESSION: *Parenchymal consolidative opacity of the right lung involving particularly right mid and right lower lobe region correlate with pneumonia. The possibility of underlying mass is not excluded due to the round lobular appearance. *Ill-defined left lower lobe infiltrates and atelectasis. Correlation with CT recommended. Electronically Signed   By: Fredrich Jefferson M.D.   On: 10/13/2023 16:22    Scheduled Meds:  ascorbic acid   500 mg Oral Daily   carbamazepine   1,000 mg Oral  q morning   Chlorhexidine  Gluconate Cloth  6 each Topical Q0600   cholecalciferol   1,000 Units Oral Daily   ferrous sulfate   325 mg Oral Daily   fluticasone  furoate-vilanterol  1 puff Inhalation Daily   heparin   5,000 Units Subcutaneous Q8H   ipratropium-albuterol   3 mL Nebulization TID   levothyroxine   125 mcg Oral Q0600   multivitamin  1 tablet Oral QHS   pantoprazole   40 mg Oral Daily   rosuvastatin   10 mg Oral QHS   trihexyphenidyl   2 mg Oral BID   vancomycin  variable dose per unstable renal function (pharmacist dosing)   Does not apply See admin instructions   zinc  sulfate (50mg  elemental zinc )  220 mg Oral Daily   Continuous Infusions:  piperacillin-tazobactam (ZOSYN)  IV Stopped (10/14/23 0807)     LOS: 1 day  MDM: Patient is high risk for one or more organ failure.  They necessitate ongoing hospitalization for continued IV therapies and subsequent lab monitoring. Total time spent interpreting labs and vitals, reviewing the medical record, coordinating care amongst consultants and care team members, directly assessing and discussing care with the patient and/or family: 55 min  Mychele Seyller, DO [facility]  To contact the attending physician between 7A-7P please use Epic Chat. To contact the covering physician during after hours 7P-7A, please review Amion.  10/14/2023, 8:33 AM   *This document has been created with the assistance of dictation software. Please excuse typographical errors. *

## 2023-10-15 ENCOUNTER — Inpatient Hospital Stay: Payer: MEDICAID

## 2023-10-15 DIAGNOSIS — R188 Other ascites: Secondary | ICD-10-CM | POA: Diagnosis not present

## 2023-10-15 DIAGNOSIS — R0600 Dyspnea, unspecified: Secondary | ICD-10-CM | POA: Diagnosis not present

## 2023-10-15 DIAGNOSIS — J449 Chronic obstructive pulmonary disease, unspecified: Secondary | ICD-10-CM | POA: Diagnosis not present

## 2023-10-15 DIAGNOSIS — R109 Unspecified abdominal pain: Secondary | ICD-10-CM | POA: Diagnosis not present

## 2023-10-15 LAB — COMPREHENSIVE METABOLIC PANEL WITH GFR
ALT: 9 U/L (ref 0–44)
AST: 23 U/L (ref 15–41)
Albumin: 3 g/dL — ABNORMAL LOW (ref 3.5–5.0)
Alkaline Phosphatase: 44 U/L (ref 38–126)
Anion gap: 17 — ABNORMAL HIGH (ref 5–15)
BUN: 31 mg/dL — ABNORMAL HIGH (ref 8–23)
CO2: 22 mmol/L (ref 22–32)
Calcium: 9 mg/dL (ref 8.9–10.3)
Chloride: 96 mmol/L — ABNORMAL LOW (ref 98–111)
Creatinine, Ser: 6.47 mg/dL — ABNORMAL HIGH (ref 0.61–1.24)
GFR, Estimated: 9 mL/min — ABNORMAL LOW (ref >60)
Glucose, Bld: 76 mg/dL (ref 70–99)
Potassium: 4.4 mmol/L (ref 3.5–5.1)
Sodium: 135 mmol/L (ref 135–145)
Total Bilirubin: 0.9 mg/dL (ref 0.0–1.2)
Total Protein: 6.6 g/dL (ref 6.5–8.1)

## 2023-10-15 LAB — CBC WITH DIFFERENTIAL/PLATELET
Abs Immature Granulocytes: 0.01 10*3/uL (ref 0.00–0.07)
Basophils Absolute: 0 10*3/uL (ref 0.0–0.1)
Basophils Relative: 1 %
Eosinophils Absolute: 0.1 10*3/uL (ref 0.0–0.5)
Eosinophils Relative: 3 %
HCT: 39.5 % (ref 39.0–52.0)
Hemoglobin: 13.4 g/dL (ref 13.0–17.0)
Immature Granulocytes: 0 %
Lymphocytes Relative: 14 %
Lymphs Abs: 0.7 10*3/uL (ref 0.7–4.0)
MCH: 34.6 pg — ABNORMAL HIGH (ref 26.0–34.0)
MCHC: 33.9 g/dL (ref 30.0–36.0)
MCV: 102.1 fL — ABNORMAL HIGH (ref 80.0–100.0)
Monocytes Absolute: 0.8 10*3/uL (ref 0.1–1.0)
Monocytes Relative: 17 %
Neutro Abs: 3.1 10*3/uL (ref 1.7–7.7)
Neutrophils Relative %: 65 %
Platelets: 144 10*3/uL — ABNORMAL LOW (ref 150–400)
RBC: 3.87 MIL/uL — ABNORMAL LOW (ref 4.22–5.81)
RDW: 14 % (ref 11.5–15.5)
WBC: 4.7 10*3/uL (ref 4.0–10.5)
nRBC: 0 % (ref 0.0–0.2)

## 2023-10-15 LAB — GLUCOSE, CAPILLARY: Glucose-Capillary: 99 mg/dL (ref 70–99)

## 2023-10-15 LAB — PHOSPHORUS: Phosphorus: 3.1 mg/dL (ref 2.5–4.6)

## 2023-10-15 LAB — MAGNESIUM: Magnesium: 2.4 mg/dL (ref 1.7–2.4)

## 2023-10-15 MED ORDER — ENSURE ENLIVE PO LIQD
237.0000 mL | Freq: Two times a day (BID) | ORAL | Status: DC
  Administered 2023-10-15 – 2023-10-19 (×8): 237 mL via ORAL

## 2023-10-15 NOTE — Plan of Care (Signed)

## 2023-10-15 NOTE — Progress Notes (Addendum)
 Hemodialysis Note:  Received patient in bed to unit. Alert and oriented. Informed consent singed and in chart.  Treatment initiated: 1347 Treatment completed: 1715  Access used: Left AVF Access issues: None  Patient requested to end treatment 35 minutes early, hes asking for his breathing treatment upstairs. Patient keep on shouting "It's my birthday, where's my ice cream" Transported back to room, alert without acute distress. Report given to patient's RN.  Total UF removed: 1.5 Liters Medications given: None  Post HD weight: 81.1 Kg  [facility]

## 2023-10-15 NOTE — Evaluation (Addendum)
 Clinical/Bedside Swallow Evaluation Patient Details  Name: Todd Brown MRN: [id number] Date of Birth: 12/05/1960  Today's Date: 10/15/2023 Time: SLP Start Time (ACUTE ONLY): 0920 SLP Stop Time (ACUTE ONLY): 1020 SLP Time Calculation (min) (ACUTE ONLY): 60 min  Past Medical History:  Past Medical History:  Diagnosis Date   Anemia    Arthritis    Chronic respiratory failure (HCC)    COPD (chronic obstructive pulmonary disease) (HCC)    Dialysis patient (HCC)    Mon. 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   ESRD (end stage renal disease) (HCC)    ESRD (end stage renal disease) on dialysis (HCC)    Hyperlipidemia    Hypertension    Malignant hypertension    Pneumonia 2016   Renal artery stenosis (HCC)    Schizophrenia (HCC)    Past Surgical History:  Past Surgical History:  Procedure Laterality Date   A/V FISTULAGRAM N/A 08/06/2016   Procedure: A/V Fistulagram;  Surgeon: Celso College, MD;  Location: [facility];  Service: Cardiovascular;  Laterality: N/A;   A/V FISTULAGRAM N/A 02/14/2021   Procedure: A/V FISTULAGRAM poss Perm Cath Insertion;  Surgeon: Jackquelyn Mass, MD;  Location: [facility];  Service: Cardiovascular;  Laterality: N/A;   A/V FISTULAGRAM Left 08/26/2021   Procedure: A/V Fistulagram;  Surgeon: Celso College, MD;  Location: [facility];  Service: Cardiovascular;  Laterality: Left;   A/V SHUNT INTERVENTION N/A 08/06/2016   Procedure: A/V Shunt Intervention;  Surgeon: Celso College, MD;  Location: [facility];  Service: Cardiovascular;  Laterality: N/A;   AV FISTULA PLACEMENT Left 12/26/2014   Procedure: ARTERIOVENOUS (AV) FISTULA CREATION;  Surgeon: Celso College, MD;  Location: [facility];  Service: Vascular;  Laterality: Left;   AV FISTULA PLACEMENT     ESOPHAGOGASTRODUODENOSCOPY (EGD) WITH PROPOFOL  N/A 05/06/2017   Procedure: ESOPHAGOGASTRODUODENOSCOPY (EGD) WITH PROPOFOL ;   Surgeon: Luke Salaam, MD;  Location: [facility];  Service: Gastroenterology;  Laterality: N/A;   INSERTION OF DIALYSIS CATHETER Right    PERIPHERAL VASCULAR CATHETERIZATION N/A 10/22/2014   Procedure: Dialysis/Perma Catheter Insertion;  Surgeon: Celso College, MD;  Location: [facility];  Service: Cardiovascular;  Laterality: N/A;   PERIPHERAL VASCULAR CATHETERIZATION N/A 02/28/2015   Procedure: Dialysis/Perma Catheter Removal;  Surgeon: Celso College, MD;  Location: [facility];  Service: Cardiovascular;  Laterality: N/A;   Repair fx left lower leg     yrs ago (age 73)   REVISON OF ARTERIOVENOUS FISTULA Left 03/27/2022   Procedure: REVISON OF ARTERIOVENOUS FISTULA (JUMP GRAFT);  Surgeon: Jackquelyn Mass, MD;  Location: [facility];  Service: Vascular;  Laterality: Left;   TEE WITHOUT CARDIOVERSION N/A 09/21/2022   Procedure: TRANSESOPHAGEAL ECHOCARDIOGRAM;  Surgeon: Constancia Delton, MD;  Location: [facility];  Service: Cardiovascular;  Laterality: N/A;   TONSILLECTOMY     HPI:  Pt is a 63 y.o. male with medical history significant of f ESRD-HD (MWF), schizophrenia, HTN, HLD, COPD on 2 L oxygen, CAD, hypothyroidism, GERD, remote right leg DVT 2018 not on anticoagulants currently, cardiac arrest, strep bacteremia with negative TEE, blindness, presents with shortness of breath, nausea, vomiting, abdominal pain, abdominal distention.  At admit, patient states he has had shortness of breath, which has been progressively worsening.  He has cough with little mucus production, denies chest pain, no fever or chills. He reports nausea, vomiting, abdominal pain, no diarrhea.  Last bowel movement was yesterday.  He has several episode of nonbilious nonbloody vomiting.  He has abdominal distention with diffuse abdominal tenderness on palpation.  He has had admits to the ED for missed HD and hypoxia per chart notes.    Chest CT: Lungs/Pleura: The central tracheo-bronchial tree is patent. There   are diffuse moderate to severe centrilobular emphysematous changes  throughout bilateral lungs.      Since the prior study, there is interval increase in the masslike opacity (concerning for malignancy) in the right lung lower lobe. The lesion measures at least  6.8 x 9.5 cm, orthogonally on axial plane. The opacity extends  across the right major fissure into the posterior segment of right upper lobe. There are new heterogeneous opacities in the right lung  lower lobe which may represent postobstructive pneumonia. Pt resides at facility w/ a legal guardian.    Assessment / Plan / Recommendation  Clinical Impression   Pt seen for BSE this morning. Pt awake, verbally engaged in basic conversation and followed instructions w/ cue. Edentulous. Speech c/b mumbled speech- edentulous w/ lingual rolling. Pt has "Blindness" and needs support w/ feeding at meals - CAN hold cup to drink, which increases his safety w/ swallowing. He was excited to realize it is his [place] today. Pt exhibits tic-like behavior c/b lingual rolling and hack cough at rest Prior To po's. Baseline dx of Schizophrenia. Unsure of pt's Baseline Cognitive status.  On [place] O2 2L, afebrile, WBC wnl.    Pt appears to present w/ slight-min oral phase dysphagia in setting of Baseline lingual rolling and Edentulous status -- min increased oral phase time noted w/ increased textured foods. Pt was able to eventually clear his mouth given Time. NO pharyngeal phase swallowing deficits witnessed/assessed. Pt consumed po's w/ no overt clinical s/s of aspiration noted.  Pt appears at reduced risk for aspiration when following general aspiration precautions, when given Supervision and feeding assistance at meals(Blindness), and when using a slightly modified diet consistency of broken-down, moistened foods d/t Edentulous status at Baseline. He required min verbal/visual/tactile cues for follow through during po tasks during meal.       Pt consumed several  trials of ice chips, purees, mech soft solids, and thin liquids via straw w/ No overt clinical s/s of aspiration noted: no decline in vocal quality, no cough, and no decline in respiratory status during/post trials. O2 sats remained 97% during session.  During the oral phase, pt exhibited the increased lingual manipulation w/ boluses(lingual rolling when at rest) w/ min lengthy oral phase gumming/mashing and lingual rolling/chewing the foods. The increased chewing behavior was noted w/ the softened solids, not liquids. Suspect this oral phase behavior is directly related to the Baseline tic-like behaviors at Baseline.  After lengthy oral phase time of chewing behavior, pt exhibited A-P transfer and oral clearing though trace+ diffuse bolus residue remained w/ more particulate foods; not noted w/ puree consistency foods. Alternated foods and liquids to aid oral clearing b/t bites -- this did aid oral clearing.  OM Exam was revealed lingual rolling but appropriate strength and ROM. No unilateral oral/lingual weakness noted. Pt required full feeding assistance w/ foods -- he held Cup when drinking.        In setting of Baseline Edentulous status w/ lingual rolling and dependency for feeding, recommend continue the dysphagia level 3(w/ more CHOPPED/MINCED meats) as previously recommended and add gravies to moisten for ease of oral phase gumming. Thin liquids. Recommend general aspiration precautions; Supervision  and Feeding assistance at all meals/po's; reduce Distractions during meals and engage pt during meals -- allow him to HOLD CUP when drinking. Alternate foods/liquids during meals.  Pills Whole vs Crushed in Puree for safer swallowing as needed. MD/NSG updated.   ST services recommends follow up w/ Palliative Care; Dietician for support. Precautions posted in room, chart. No further skilled ST services indicated - pt appears at/close to his Baseline. Team updated via secure chat.  SLP Visit Diagnosis:  Dysphagia, oral phase (R13.11) (slight-min at Baseline d/t lingual rolling and Edentulous status; Blindness; needs feeding support at meals)    Aspiration Risk  Risk for inadequate nutrition/hydration (reduced following general precautions)    Diet Recommendation   Thin;Dysphagia 3 (mechanical soft) (chopped/minced meats w/ gravies) = dysphagia level 3(w/ more CHOPPED/MINCED meats) as previously recommended and add gravies to moisten for ease of oral phase gumming. Thin liquids. Recommend general aspiration precautions; Supervision and Feeding assistance at all meals/po's; reduce Distractions during meals and engage pt during meals -- allow him to HOLD CUP when drinking. Alternate foods/liquids during meals.  Medication Administration: Whole meds with puree (vs need to Crush)    Other  Recommendations Recommended Consults:  (Dietician f/u) Oral Care Recommendations: Oral care BID;Staff/trained caregiver to provide oral care    Recommendations for follow up therapy are one component of a multi-disciplinary discharge planning process, led by the attending physician.  Recommendations may be updated based on patient status, additional functional criteria and insurance authorization.  Follow up Recommendations No SLP follow up      Assistance Recommended at Discharge  FULL d/t Vision deficits  Functional Status Assessment Patient has not had a recent decline in their functional status  Frequency and Duration  (n/a)   (n/a)       Prognosis Prognosis for improved oropharyngeal function: Fair (-Good) Barriers to Reach Goals: Time post onset;Severity of deficits Barriers/Prognosis Comment: Baseline lingual rolling and Edentulous status; Blindness; needs feeding support at meals      Swallow Study   General Date of Onset: 10/13/23 HPI: Pt is a 63 y.o. male with medical history significant of f ESRD-HD (MWF), schizophrenia, HTN, HLD, COPD on 2 L oxygen, CAD, hypothyroidism, GERD, remote right  leg DVT 2018 not on anticoagulants currently, cardiac arrest, strep bacteremia with negative TEE, blindness, presents with shortness of breath, nausea, vomiting, abdominal pain, abdominal distention.  At admit, patient states he has had shortness of breath, which has been progressively worsening.  He has cough with little mucus production, denies chest pain, no fever or chills. He reports nausea, vomiting, abdominal pain, no diarrhea.  Last bowel movement was yesterday.  He has several episode of nonbilious nonbloody vomiting.  He has abdominal distention with diffuse abdominal tenderness on palpation.  He has had admits to the ED for missed HD and hypoxia per chart notes.    Chest CT: Lungs/Pleura: The central tracheo-bronchial tree is patent. There  are diffuse moderate to severe centrilobular emphysematous changes  throughout bilateral lungs.      Since the prior study, there is interval increase in the masslike opacity (concerning for malignancy) in the right lung lower lobe. The lesion measures at least  6.8 x 9.5 cm, orthogonally on axial plane. The opacity extends  across the right major fissure into the posterior segment of right upper lobe. There are new heterogeneous opacities in the right lung  lower lobe which may represent postobstructive pneumonia. Type of Study: Bedside Swallow Evaluation Previous Swallow Assessment: 2021; 2023 -  mech soft diet previously. Currently on same. Diet Prior to this Study: Dysphagia 3 (mechanical soft);Thin liquids (Level 0) Temperature Spikes Noted: No (wbc 4.7) Respiratory Status: Nasal cannula (2L) History of Recent Intubation: No Behavior/Cognition: Alert;Cooperative;Pleasant mood;Distractible;Requires cueing (Blindness) Oral Cavity Assessment: Within Functional Limits Oral Care Completed by SLP: Yes Oral Cavity - Dentition: Edentulous Vision: Impaired for self-feeding Self-Feeding Abilities: Needs assist;Needs set up;Total assist (d/t Vision  deficits) Patient Positioning: Upright in bed (MIN++ assist) Baseline Vocal Quality: Normal Volitional Cough: Strong Volitional Swallow: Able to elicit    Oral/Motor/Sensory Function Overall Oral Motor/Sensory Function: Within functional limits (lingual rolling at baseline)   Ice Chips Ice chips: Within functional limits Presentation: Spoon (fed; 2 trials)   Thin Liquid Thin Liquid: Within functional limits Presentation: Straw;Self Fed (placed in his hand; ~12 ozs total) Other Comments: water, juice, ensure    Nectar Thick Nectar Thick Liquid: Not tested   Honey Thick Honey Thick Liquid: Not tested   Puree Puree: Within functional limits Presentation: Spoon (fed; ~8 ozs)   Solid     Solid: Impaired (min) Presentation: Spoon (fed; 15+ trials) Oral Phase Impairments: Impaired mastication Oral Phase Functional Implications: Impaired mastication (prolonged oral phase time) Pharyngeal Phase Impairments:  (no overt s/s)        Darla Edward, MS, CCC-SLP Speech Language Pathologist Rehab Services; [facility] [place] [phone] (ascom) Allisa Einspahr 10/15/2023,1:07 PM

## 2023-10-15 NOTE — TOC Initial Note (Signed)
 Transition of Care [facility]) - Initial/Assessment Note    Patient Details  Name: Todd Brown MRN: [id number] Date of Birth: 11-01-1960  Transition of Care [facility]) CM/SW Contact:    Loman Risk, RN Phone Number: 10/15/2023, 2:38 PM  Clinical Narrative:                    Patient admitted from [facility] ([facility]) Per Katherina Pan patient is WC bound at baseline, but is able to transport himself.   Patient legal guardian is [place] [employer]  DSS CSW [name] to bed side yesterday l to discuss goals with patient    Per MD "need legal gaurdian to make complex care decision regarding his goals "   Patient Goals and CMS Choice            Expected Discharge Plan and Services                                              Prior Living Arrangements/Services                       Activities of Daily Living   ADL Screening (condition at time of admission) Independently performs ADLs?: No Does the patient have a NEW difficulty with bathing/dressing/toileting/self-feeding that is expected to last >3 days?: No Does the patient have a NEW difficulty with getting in/out of bed, walking, or climbing stairs that is expected to last >3 days?: No Does the patient have a NEW difficulty with communication that is expected to last >3 days?: No Is the patient deaf or have difficulty hearing?: No Does the patient have difficulty seeing, even when wearing glasses/contacts?: Yes Does the patient have difficulty concentrating, remembering, or making decisions?: Yes  Permission Sought/Granted                  Emotional Assessment              Admission diagnosis:  Postobstructive pneumonia [J18.9] Patient Active Problem List   Diagnosis Date Noted   Dyspnea 10/14/2023   Postobstructive pneumonia 10/13/2023   Lung mass 10/13/2023   HLD (hyperlipidemia) 10/13/2023   Abdominal pain 10/13/2023   Ascites 10/13/2023   Nonspecific chest  pain 07/07/2023   Influenza A 06/16/2023   Acute hypoxemic respiratory failure (HCC) 04/30/2023   Blindness of both eyes 04/30/2023   Elevated troponin 04/30/2023   Low back pain 04/30/2023   Productive cough 04/30/2023   Myocardial injury 11/03/2022   CAD (coronary artery disease) 11/03/2022   Hypothyroidism 11/03/2022   Thrombocytopenia (HCC) 11/03/2022   Aspiration pneumonia (HCC) 11/03/2022   Abdominal distension 11/03/2022   HTN (hypertension) 11/03/2022   Pericardial effusion 11/03/2022   Community acquired pneumonia 09/14/2022   Anasarca (mild pulmonary edema, pleural and pericardial effusion, ascites on CT) 09/14/2022   Acute on chronic respiratory failure (HCC) 09/14/2022   Bacteremia 06/20/2022   Acute encephalopathy 06/19/2022   SIRS (systemic inflammatory response syndrome) (HCC) 06/19/2022   NSTEMI (non-ST elevated myocardial infarction) (HCC) 06/19/2022   Acute on chronic respiratory failure with hypoxia (HCC) 05/21/2022   Hypoxia 05/20/2022   COVID-19 virus infection 05/20/2022   Hyponatremia 02/10/2022   Anemia due to chronic kidney disease 01/21/2022   Hyperkalemia 01/14/2022   Malnutrition of moderate degree 12/24/2021   Oral thrush 12/22/2021   Physical deconditioning 12/20/2021  Acute metabolic encephalopathy 12/15/2021   Essential hypertension 12/15/2021   ESRD on hemodialysis (HCC) 12/15/2021   Altered mental status 10/08/2021   Hypertensive urgency 10/07/2021   COPD (chronic obstructive pulmonary disease) (HCC) 02/26/2021   GERD (gastroesophageal reflux disease) 02/26/2021   Hypertension 11/28/2020   Acute respiratory failure with hypoxia (HCC) 10/23/2019   Anemia of chronic disease 10/23/2019   Fluid overload 10/08/2018   Acute pulmonary edema (HCC) 09/12/2018   Scrotal edema 05/10/2017   Symptomatic anemia 04/30/2017   Anticoagulated on Coumadin  09/14/2016   Cardiac arrest (HCC) 08/03/2016   Severe sepsis versus SIRS 05/26/2016   Dialysis  patient (HCC) 04/15/2016   ESRD needing dialysis (HCC) 02/12/2016   Hyperkalemia 05/20/2015   Hepatitis C 09/26/2013   Schizophrenia (HCC) 03/23/2013   Essential hypertension, benign 03/23/2013   Chronic kidney disease 03/23/2013   PCP:  Janann Meadow, NP Pharmacy:   [facility] - [place], [place] - [address] [place] [address] Phone: [phone] Fax: [phone]     Social Drivers of Health (SDOH) Social History: SDOH Screenings   Food Insecurity: No Food Insecurity (10/14/2023)  Housing: Low Risk  (10/13/2023)  Transportation Needs: Patient Unable To Answer (10/14/2023)  Recent Concern: Transportation Needs - Unmet Transportation Needs (10/13/2023)  Utilities: Not At Risk (10/13/2023)  Alcohol Screen: Low Risk  (10/05/2019)  Financial Resource Strain: Low Risk  (10/08/2018)  Physical Activity: Unknown (10/08/2018)  Social Connections: Unknown (10/08/2018)  Stress: No Stress Concern Present (10/08/2018)  Tobacco Use: Low Risk  (10/13/2023)   SDOH Interventions:     Readmission Risk Interventions    10/15/2023    2:37 PM 07/06/2023    9:31 AM 06/17/2023    2:42 PM  Readmission Risk Prevention Plan  Transportation Screening Complete Complete Complete  PCP or Specialist Appt within 3-5 Days  Complete   Social Work Consult for Recovery Care Planning/Counseling  Complete   Palliative Care Screening  Complete   Medication Review Oceanographer)  Complete Complete  PCP or Specialist appointment within 3-5 days of discharge   Complete  SW Recovery Care/Counseling Consult Complete  Complete  Palliative Care Screening Complete  Not Applicable  Skilled Nursing Facility   Not Applicable

## 2023-10-15 NOTE — Progress Notes (Signed)
 [facility]  ROUNDING NOTE   Subjective:   Todd Brown is a 63 year old male with past medical conditions including COPD, hypertension, DVT, schizophrenia, and end-stage renal disease on hemodialysis.  Patient presents to the emergency department with cough and shortness of breath.  Patient has been admitted for Postobstructive pneumonia [J18.9]  Patient is known to our practice and receives outpatient dialysis treatments at [facility] on a MWF schedule, supervised by Dr. Rhesa Celeste.   Patient seen sitting up in bed Nonproductive cough present Weaned to 2 L nasal cannula (baseline) N.p.o. for paracentesis Mild lower extremity edema   Objective:  Vital signs in last 24 hours:  Temp:  [97.6 F (36.4 C)-98.6 F (37 C)] 98.6 F (37 C) (05/16 0808) Pulse Rate:  [63-84] 81 (05/16 1043) Resp:  [14-22] 18 (05/16 0808) BP: (90-131)/(65-87) 105/78 (05/16 1043) SpO2:  [93 %-100 %] 97 % (05/16 1043) Weight:  [78.6 kg-82.3 kg] 82.3 kg (05/16 0359)  Weight change: 13.5 kg Filed Weights   10/14/23 1324 10/14/23 1800 10/15/23 0359  Weight: 81.1 kg 78.6 kg 82.3 kg    Intake/Output: I/O last 3 completed shifts: In: 321 [P.O.:180; IV Piggyback:141] Out: 2500 [Other:2500]   Intake/Output this shift:  No intake/output data recorded.  Physical Exam: General: NAD  Head: Normocephalic, atraumatic. Moist oral mucosal membranes  Eyes: Anicteric  Lungs:  Clear to auscultation, normal effort, NCO 2  Heart: Regular rate and rhythm  Abdomen:  Soft, nontender, distention  Extremities: Trace peripheral edema.  Neurologic: Alert and oriented, moving all four extremities  Skin: No lesions  Access: Left aVF    Basic Metabolic Panel: Recent Labs  Lab 10/13/23 1620 10/14/23 0610 10/15/23 0411  NA 135 135 135  K 4.0 4.0 4.4  CL 95* 95* 96*  CO2 26 26 22   GLUCOSE 97 85 76  BUN 39* 45* 31*  CREATININE 7.61* 8.26* 6.47*  CALCIUM  9.3 8.8* 9.0  MG  --   --  2.4  PHOS   --   --  3.1    Liver Function Tests: Recent Labs  Lab 10/13/23 1620 10/15/23 0411  AST 27 23  ALT 9 9  ALKPHOS 51 44  BILITOT 0.9 0.9  PROT 7.1 6.6  ALBUMIN 3.2* 3.0*   Recent Labs  Lab 10/13/23 1620  LIPASE 26   Recent Labs  Lab 10/13/23 2250  AMMONIA <13    CBC: Recent Labs  Lab 10/13/23 1620 10/14/23 0610 10/15/23 0411  WBC 6.3 6.1 4.7  NEUTROABS  --   --  3.1  HGB 13.9 12.9* 13.4  HCT 41.9 37.1* 39.5  MCV 102.2* 98.9 102.1*  PLT 223 204 144*    Cardiac Enzymes: No results for input(s): "CKTOTAL", "CKMB", "CKMBINDEX", "TROPONINI" in the last 168 hours.  BNP: Invalid input(s): "POCBNP"  CBG: No results for input(s): "GLUCAP" in the last 168 hours.  Microbiology: Results for orders placed or performed during the hospital encounter of 10/13/23  Blood culture (routine x 2)     Status: None (Preliminary result)   Collection Time: 10/13/23  9:14 PM   Specimen: BLOOD  Result Value Ref Range Status   Specimen Description BLOOD BLOOD RIGHT ARM  Final   Special Requests   Final    BOTTLES DRAWN AEROBIC AND ANAEROBIC Blood Culture adequate volume   Culture   Final    NO GROWTH 2 DAYS Performed at [facility], [address] [place], [place] [address]    Report Status PENDING  Incomplete  Blood culture (routine x 2)     Status: None (Preliminary result)   Collection Time: 10/13/23 10:50 PM   Specimen: BLOOD  Result Value Ref Range Status   Specimen Description BLOOD BLOOD RIGHT ARM  Final   Special Requests   Final    BOTTLES DRAWN AEROBIC ONLY Blood Culture results may not be optimal due to an inadequate volume of blood received in culture bottles   Culture   Final    NO GROWTH 2 DAYS Performed at [facility], [address] [place], [place] [address]    Report Status PENDING  Incomplete  MRSA Next Gen by PCR, Nasal     Status: None   Collection Time: 10/13/23 11:09 PM   Specimen: Nasal Mucosa; Nasal Swab  Result Value Ref  Range Status   MRSA by PCR Next Gen NOT DETECTED NOT DETECTED Final    Comment: (NOTE) The GeneXpert MRSA Assay (FDA approved for NASAL specimens only), is one component of a comprehensive MRSA colonization surveillance program. It is not intended to diagnose MRSA infection nor to guide or monitor treatment for MRSA infections. Test performance is not FDA approved in patients less than 6 years old. Performed at [facility], [address] [place], [place] [address]   Resp panel by RT-PCR (RSV, Flu A&B, Covid)     Status: None   Collection Time: 10/14/23  2:41 AM  Result Value Ref Range Status   SARS Coronavirus 2 by RT PCR NEGATIVE NEGATIVE Final    Comment: (NOTE) SARS-CoV-2 target nucleic acids are NOT DETECTED.  The SARS-CoV-2 RNA is generally detectable in upper respiratory specimens during the acute phase of infection. The lowest concentration of SARS-CoV-2 viral copies this assay can detect is 138 copies/mL. A negative result does not preclude SARS-Cov-2 infection and should not be used as the sole basis for treatment or other patient management decisions. A negative result may occur with  improper specimen collection/handling, submission of specimen other than nasopharyngeal swab, presence of viral mutation(s) within the areas targeted by this assay, and inadequate number of viral copies(<138 copies/mL). A negative result must be combined with clinical observations, patient history, and epidemiological information. The expected result is Negative.  Fact Sheet for Patients:  BloggerCourse.com  Fact Sheet for Healthcare Providers:  SeriousBroker.it  This test is no t yet approved or cleared by the [place]  FDA and  has been authorized for detection and/or diagnosis of SARS-CoV-2 by FDA under an Emergency Use Authorization (EUA). This EUA will remain  in effect (meaning this test can be used) for the  duration of the COVID-19 declaration under Section 564(b)(1) of the Act, 21 U.S.C.section 360bbb-3(b)(1), unless the authorization is terminated  or revoked sooner.       Influenza A by PCR NEGATIVE NEGATIVE Final   Influenza B by PCR NEGATIVE NEGATIVE Final    Comment: (NOTE) The Xpert Xpress SARS-CoV-2/FLU/RSV plus assay is intended as an aid in the diagnosis of influenza from Nasopharyngeal swab specimens and should not be used as a sole basis for treatment. Nasal washings and aspirates are unacceptable for Xpert Xpress SARS-CoV-2/FLU/RSV testing.  Fact Sheet for Patients: BloggerCourse.com  Fact Sheet for Healthcare Providers: SeriousBroker.it  This test is not yet approved or cleared by the [place]  FDA and has been authorized for detection and/or diagnosis of SARS-CoV-2 by FDA under an Emergency Use Authorization (EUA). This EUA will remain in effect (meaning this test can be used) for the duration of  the COVID-19 declaration under Section 564(b)(1) of the Act, 21 U.S.C. section 360bbb-3(b)(1), unless the authorization is terminated or revoked.     Resp Syncytial Virus by PCR NEGATIVE NEGATIVE Final    Comment: (NOTE) Fact Sheet for Patients: BloggerCourse.com  Fact Sheet for Healthcare Providers: SeriousBroker.it  This test is not yet approved or cleared by the [place]  FDA and has been authorized for detection and/or diagnosis of SARS-CoV-2 by FDA under an Emergency Use Authorization (EUA). This EUA will remain in effect (meaning this test can be used) for the duration of the COVID-19 declaration under Section 564(b)(1) of the Act, 21 U.S.C. section 360bbb-3(b)(1), unless the authorization is terminated or revoked.  Performed at [facility], [address] [place], [place] [address]     Coagulation Studies: No results for input(s):  "LABPROT", "INR" in the last 72 hours.  Urinalysis: No results for input(s): "COLORURINE", "LABSPEC", "PHURINE", "GLUCOSEU", "HGBUR", "BILIRUBINUR", "KETONESUR", "PROTEINUR", "UROBILINOGEN", "NITRITE", "LEUKOCYTESUR" in the last 72 hours.  Invalid input(s): "APPERANCEUR"    Imaging: CT ABDOMEN PELVIS WO CONTRAST Result Date: 10/13/2023 CLINICAL DATA:  Nausea vomiting and short of breath EXAM: CT ABDOMEN AND PELVIS WITHOUT CONTRAST TECHNIQUE: Multidetector CT imaging of the abdomen and pelvis was performed following the standard protocol without IV contrast. RADIATION DOSE REDUCTION: This exam was performed according to the departmental dose-optimization program which includes automated exposure control, adjustment of the mA and/or kV according to patient size and/or use of iterative reconstruction technique. COMPARISON:  CT 11/03/2022, chest CT 10/13/2023 FINDINGS: Lower chest: See separately dictated chest CT. Partially visualized mass/masslike consolidation in the right lower lobe. Partial left lower lobe consolidation. Bilateral pleural effusions. Cardiomegaly. Coronary vascular calcification. Small pericardial effusion Hepatobiliary: No calcified gallstone. No focal hepatic abnormality or biliary dilatation Pancreas: Unremarkable. No pancreatic ductal dilatation or surrounding inflammatory changes. Spleen: Normal in size without focal abnormality. Adrenals/Urinary Tract: Enlarged thickened adrenal glands as before. 2.3 cm suspected left adrenal adenoma for which no imaging follow-up is recommended. Atrophic kidneys without hydronephrosis. Bilateral renal cysts for which no imaging follow-up is recommended. Punctate stones in the kidneys. Bladder is decompressed. Stomach/Bowel: Centralization of the bowel. Nondistended stomach. No dilated small bowel. Mild diffuse colon wall thickening versus under distension, chronic. No definite acute inflammatory bowel process. Vascular/Lymphatic: Advanced aortic  atherosclerosis. No aneurysm. No suspicious lymph nodes Reproductive: Negative prostate.  Fluid in the scrotum Other: No free air. Large volume abdominopelvic ascites. Generalized subcutaneous edema consistent with anasarca Musculoskeletal: No acute osseous abnormality IMPRESSION: 1. Negative for bowel obstruction or acute bowel inflammatory process. 2. Large volume abdominopelvic ascites. Generalized subcutaneous edema consistent with anasarca. 3. Cardiomegaly with small pericardial effusion. See separately dictated chest CT for additional findings in the chest 4. Atrophic kidneys with punctate stones. No hydronephrosis. 5. Aortic atherosclerosis. Aortic Atherosclerosis (ICD10-I70.0). Electronically Signed   By: Esmeralda Hedge M.D.   On: 10/13/2023 20:27   CT CHEST WO CONTRAST Result Date: 10/13/2023 CLINICAL DATA:  Abnormal xray - lung opacity/opacities. EXAM: CT CHEST WITHOUT CONTRAST TECHNIQUE: Multidetector CT imaging of the chest was performed following the standard protocol without IV contrast. RADIATION DOSE REDUCTION: This exam was performed according to the departmental dose-optimization program which includes automated exposure control, adjustment of the mA and/or kV according to patient size and/or use of iterative reconstruction technique. COMPARISON:  CT angiography chest from 07/05/2023. FINDINGS: Cardiovascular: Mild cardiomegaly. No pericardial effusion. No aortic aneurysm. There are coronary artery calcifications, in keeping with coronary artery disease. There are also mild peripheral  atherosclerotic vascular calcifications of thoracic aorta and its major branches. There is dilation of the main pulmonary trunk measuring up to 3.8 cm, which is nonspecific but can be seen with pulmonary artery hypertension. Mediastinum/Nodes: Visualized thyroid  gland appears grossly unremarkable. No solid / cystic mediastinal masses. The esophagus is nondistended precluding optimal assessment. There is an enlarged  right lower paratracheal lymph node measuring 1.2 x 2.0 cm, which appears mildly enlarged since the prior study when it measured up to 0.9 x 1.8 cm. No axillary lymphadenopathy by size criteria. Evaluation of bilateral hila is limited due to lack on intravenous contrast: however, no large hilar lymphadenopathy identified. Lungs/Pleura: The central tracheo-bronchial tree is patent. There are diffuse moderate to severe centrilobular emphysematous changes throughout bilateral lungs. Since the prior study, there is interval increase in the masslike opacity in the right lung lower lobe. The lesion measures at least 6.8 x 9.5 cm, orthogonally on axial plane. The opacity extends across the right major fissure into the posterior segment of right upper lobe. There are new heterogeneous opacities in the right lung lower lobe which may represent postobstructive pneumonia. There is small right pleural effusion, which has increased since the prior study. There are multi segmental atelectatic changes at the left lung lower lobe, which also appears increased in size since the prior study. There is small left pleural effusion which is also slightly increased. There is lobulated approximately 1.2 x 1.2 cm nodule in the left lung lower lobe which appear increased in size since the prior study when it measured up to 8 x 13 mm. Upper Abdomen: There is small-to-moderate ascites. Bilateral small/atrophic kidneys noted. There are several hypoattenuating structures in bilateral kidneys, not well evaluated on the current exam. There is small sliding hiatal hernia. Remaining visualized upper abdominal viscera within normal limits. Musculoskeletal: The visualized soft tissues of the chest wall are grossly unremarkable. No suspicious osseous lesions. Probable old healed fracture of the mid sternal body noted. There are mild multilevel degenerative changes in the visualized spine. 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 IMPRESSION: 1. Since the prior study, there is interval increase in the masslike opacity in the right lung, which is concerning for malignancy. There are new heterogeneous opacities in the right lung lower lobe, which may represent postobstructive pneumonia. Pulmonary consultation and further evaluation with contrast-enhanced chest CT scan is recommended. 2. There is also interval increase in the size of left lung lower lobe nodule. 3. There is interval increase in bilateral pleural effusions. 4. There is interval increase in the size of right lower paratracheal lymph node, which may be reactive versus metastatic. 5. Multiple other nonacute observations, as described above. Aortic Atherosclerosis (ICD10-I70.0) and Emphysema (ICD10-J43.9). Electronically Signed   By: Beula Brunswick M.D.   On: 10/13/2023 18:14   DG Chest Portable 1 View Result Date: 10/13/2023 CLINICAL DATA:  Shortness of breath EXAM: PORTABLE CHEST 1 VIEW COMPARISON:  July 05, 2023 FINDINGS: Parenchymal consolidative opacity of the right lung involving particularly right mid and right lower lobe region correlate with pneumonia. The possibility of underlying mass is not excluded due to the round lobular appearance. And findings correlate with the prior CT chest angio July 05, 2023 that demonstrated a round consolidating process of the right lower lobe. Ill-defined left lower lobe infiltrates and atelectasis. Correlation with CT recommended. Heart and mediastinum within normal limits IMPRESSION: *Parenchymal consolidative opacity of the right lung involving particularly right mid and right lower  lobe region correlate with pneumonia. The possibility of underlying mass is not excluded due to the round lobular appearance. *Ill-defined left lower lobe infiltrates and atelectasis. Correlation with CT recommended. Electronically Signed   By: Fredrich Jefferson M.D.   On: 10/13/2023 16:22      Medications:    piperacillin-tazobactam (ZOSYN)  IV 2.25 g (10/15/23 0440)    ascorbic acid   500 mg Oral Daily   carbamazepine   1,000 mg Oral q morning   Chlorhexidine  Gluconate Cloth  6 each Topical Q0600   cholecalciferol   1,000 Units Oral Daily   feeding supplement  237 mL Oral BID BM   ferrous sulfate   325 mg Oral Daily   fluticasone  furoate-vilanterol  1 puff Inhalation Daily   heparin   5,000 Units Subcutaneous Q8H   ipratropium-albuterol   3 mL Nebulization TID   levothyroxine   125 mcg Oral Q0600   multivitamin  1 tablet Oral QHS   pantoprazole   40 mg Oral Daily   rosuvastatin   10 mg Oral QHS   trihexyphenidyl   2 mg Oral BID   zinc  sulfate (50mg  elemental zinc )  220 mg Oral Daily   acetaminophen , albuterol , dextromethorphan -guaiFENesin , hydrALAZINE , hyoscyamine , LORazepam , morphine  injection, ondansetron  (ZOFRAN ) IV, polyethylene glycol, traMADol   Assessment/ Plan:  Todd Brown is a 63 y.o.  male  with past medical conditions including COPD, hypertension, DVT, schizophrenia, and end-stage renal disease on hemodialysis.  Patient presents to the emergency department with cough and shortness of breath.  Patient has been admitted for Postobstructive pneumonia [J18.9]  [facility]/MWF/left aVF  End-stage renal disease on hemodialysis.  Patient received dialysis treatment yesterday, UF 2.5 L achieved.  Patient receiving dialysis today to maintain outpatient schedule.  UF goal 1.5 to 2 L.  2.  Acute respiratory failure requiring increased oxygen requirement.  Placed on 4 L nasal cannula on admission..  Chest x-ray suspicious for pneumonia.  Primary team has ordered supportive measures with antitussives and bronchodilators.  Oxygen has been weaned to 2 L.  Remains on Zosyn.  3.  Lung mass, unspecified.  CT abdomen pelvis show right lobe lung mass, increased in size since previous imaging.  Concerning for malignancy.  Primary team has consulted oncology.  Due to  multiple comorbidities, not a viable candidate for treatment.  Primary team consulted palliative for assistance with goals.  4. Anemia of chronic kidney disease.  Normocytic Lab Results  Component Value Date   HGB 13.4 10/15/2023    Hemoglobin at goal no need for ESA's at this time.  Patient does receive Mircera at outpatient clinic.  5. Secondary Hyperparathyroidism: with outpatient labs: none available  Lab Results  Component Value Date   PTH 194 (H) 05/11/2017   CALCIUM  9.0 10/15/2023   CAION 1.19 03/27/2022   PHOS 3.1 10/15/2023    Calcium  and phosphorus within optimal range.  Will continue to monitor for now.   LOS: 2 Gila Lauf 5/16/202512:18 PM

## 2023-10-15 NOTE — Progress Notes (Signed)
 PROGRESS NOTE    Todd Brown  ZDG:[id number] DOB: 28-Oct-1960 DOA: 10/13/2023 PCP: Janann Meadow, NP  No chief complaint on file.   Hospital Course:  Todd Brown is a 63 year old male with ESRD-HD MWF, schizophrenia, hypertension, hyperlipidemia, COPD with chronic hypoxic respiratory failure on 2 L, CAD, recurrent ascites requiring prior paracentesis, hypothyroidism, GERD, remote right leg DVT 2018 not on AC, prior cardiac arrest, prior strep bacteremia with negative TEE, blindness, who presents on this admission from his group home complaining of shortness of breath, nausea, vomiting, abdominal pain and distention.  Patient missed his dialysis on 5/16 due to abdominal pain.  In the ED he was found to have increasing O2 requirement, to 4 L.  Labs consistent with ESRD.  Blood pressure 89/63, heart rate 74, respiratory rate 25.  Chest x-ray revealed right lung opacity and left lower lobe infiltrates.  CT chest confirms interval increase in masslike opacity in right lung most concerning for malignancy as well as new heterogenous opacities in the right lower lobe which could represent postobstructive pneumonia.  There is also an interval increase in the left lung lower lobe nodule and bilateral pleural effusions.  Patient was admitted for further workup.  Oncology was consulted.  Subjective: No acute events overnight.  Patient is eating lunch upon my arrival.  He has no acute complaints.  I attempted to reach his guardian by phone today, no answer.  Voicemail left   Objective: Vitals:   10/14/23 1942 10/15/23 0341 10/15/23 0359 10/15/23 0808  BP: 121/77 108/82  114/87  Pulse: 84 65  63  Resp: 20 16  18   Temp: 98.2 F (36.8 C) 97.6 F (36.4 C)  98.6 F (37 C)  TempSrc: Oral Oral  Oral  SpO2: 96% 100%  99%  Weight:   82.3 kg   Height:        Intake/Output Summary (Last 24 hours) at 10/15/2023 0845 Last data filed at 10/14/2023 1758 Gross per 24 hour  Intake 120 ml  Output 2500  ml  Net -2380 ml   Filed Weights   10/14/23 1324 10/14/23 1800 10/15/23 0359  Weight: 81.1 kg 78.6 kg 82.3 kg    Examination: General exam: Appears calm and comfortable, NAD  Respiratory system: No work of breathing, symmetric chest wall expansion Cardiovascular system: S1 & S2 heard, RRR.  Gastrointestinal system: Abdomen is distended, nontender. Neuro: Alert, speech unintelligible at times, answer questions consistently.  He requires prompting. Extremities: Symmetric, expected ROM Skin: No rashes, lesions Psychiatry: Difficult to assess.  Calm.  Assessment & Plan:  Principal Problem:   Postobstructive pneumonia Active Problems:   Community acquired pneumonia   Lung mass   COPD (chronic obstructive pulmonary disease) (HCC)   Abdominal pain   Ascites   ESRD on hemodialysis (HCC)   HLD (hyperlipidemia)   Essential hypertension   CAD (coronary artery disease)   Hypothyroidism   Schizophrenia (HCC)   Blindness of both eyes   Dyspnea    Postobstructive pneumonia Acute on chronic hypoxic respiratory failure - At baseline patient requires 2 L O2, was tachypneic on arrival requiring 4 L.  Has improved significantly now - Currently 99% on 2 L, will attempt to wean to RA today - Patient is without fever or leukocytosis.  Sepsis ruled out - Obstruction secondary to mass, likely malignancy - No large hilar lymphadenopathy - Initially initiated on vancomycin  and Zosyn.  MRSA PCR negative.  Now on Zosyn - Continue to encourage incentive spirometry, will be somewhat  limited due to patient ability - Continue symptomatic support Mucinex  bronchodilators - Legionella and strep pneumo antigen ordered, still pending. - Blood cultures and sputum cultures taken, will follow - Respiratory viral panel negative.  Lung mass - Seen on CT with interval increase in size, 6.8 x 9.5 cm extending across the right major fissure into the posterior segment of the right upper lung - Oncology has  been consulted.  Suspect that this is malignancy and recommends palliative care - Palliative care has been consulted, appreciate their assistance with GOC - Given patient's extensive comorbidities he is a poor candidate for aggressive chemotherapy or surgical resection - On discussion with patient's DSS guardian, it appears that patient has previously declined cancer therapy for possible colorectal cancer.  She plans to come to the hospital for in person visit with the patient and have discussion regarding work up/treatment plan. - Reached out to the legal guardian again today, no answer.  Have left a voicemail. - It does not appear that patient has capacity to have complex medical discussions at this time. - Ultimately patient may be an excellent hospice candidate but given his reliance on hemodialysis may better benefit from palliative care services notes that if he is not ready to discontinue HD.  Bilateral pleural effusions - Small.  Expect further improvement with hemodialysis - Currently 99% on 2 L [place], can likely wean to room air.  COPD - Continue with bronchodilators and Mucinex  as above - Does not appear to be wheezing on exam, hold off on steroids at this time continue with bronchodilators and Mucinex  as above  Abdominal ascites - Patient with abdominal distention and pain.  CT abdomen pelvis with large volume ascites and subcutaneous edema consistent with anasarca - Have requested for paracentesis, received albumin 5/15.  Currently holding a MAP of 98.  Anticipate no issues with paracentesis if done today.  Anticipate there will be some complexity if we are unable to reach the patient's guardian for consent for this procedure - Ammonia level within normal limits  History of hepatitis C. - Chronic, unclear treatment.  Has required paracentesis in the past - LFTs within normal limits.  Ammonia unremarkable - Does have significant ascites. - Paracentesis ordered as above  ESRD on HD  MWF - Nephrology consulted for dialysis during this admission - Continue monitor electrolytes closely.  Currently stable.    Schizophrenia - Resume home meds  Hypertension - Monitor closely, titrate as needed - Resume home meds  Hyperlipidemia - Statin  Hypothyroidism - Resume home dose levothyroxine   CAD - Stable.  Resume home meds  GERD - PPI  Blindness  Resides at a group home - Legal guardian to visit and discuss prognosis with patient  Adrenal adenoma - 2.3 cm suspected left adrenal adenoma seen on CT.  No follow-up imaging recommended  DVT prophylaxis: Heparin    Code Status: Limited: Do not attempt resuscitation (DNR) -DNR-LIMITED -Do Not Intubate/DNI  Disposition: Inpatient, prognosis guarded.  Pending discussions with legal guardian regarding the aggressiveness of care at this time.  Consultants:    Procedures:    Antimicrobials:  Anti-infectives (From admission, onward)    Start     Dose/Rate Route Frequency Ordered Stop   10/13/23 2345  vancomycin  (VANCOREADY) IVPB 1500 mg/300 mL        1,500 mg 150 mL/hr over 120 Minutes Intravenous  Once 10/13/23 2256 10/14/23 0914   10/13/23 2100  piperacillin-tazobactam (ZOSYN) IVPB 2.25 g        2.25 g 100  mL/hr over 30 Minutes Intravenous Every 8 hours 10/13/23 2024     10/13/23 2030  vancomycin  (VANCOREADY) IVPB 1500 mg/300 mL  Status:  Discontinued        1,500 mg 150 mL/hr over 120 Minutes Intravenous  Once 10/13/23 2024 10/13/23 2256   10/13/23 2024  vancomycin  variable dose per unstable renal function (pharmacist dosing)  Status:  Discontinued         Does not apply See admin instructions 10/13/23 2024 10/14/23 0959   10/13/23 1830  cefTRIAXone  (ROCEPHIN ) 1 g in sodium chloride  0.9 % 100 mL IVPB  Status:  Discontinued        1 g 200 mL/hr over 30 Minutes Intravenous  Once 10/13/23 1828 10/13/23 2024   10/13/23 1830  azithromycin  (ZITHROMAX ) 500 mg in sodium chloride  0.9 % 250 mL IVPB  Status:   Discontinued        500 mg 250 mL/hr over 60 Minutes Intravenous  Once 10/13/23 1828 10/13/23 2024       Data Reviewed: I have personally reviewed following labs and imaging studies CBC: Recent Labs  Lab 10/13/23 1620 10/14/23 0610 10/15/23 0411  WBC 6.3 6.1 4.7  NEUTROABS  --   --  3.1  HGB 13.9 12.9* 13.4  HCT 41.9 37.1* 39.5  MCV 102.2* 98.9 102.1*  PLT 223 204 144*   Basic Metabolic Panel: Recent Labs  Lab 10/13/23 1620 10/14/23 0610 10/15/23 0411  NA 135 135 135  K 4.0 4.0 4.4  CL 95* 95* 96*  CO2 26 26 22   GLUCOSE 97 85 76  BUN 39* 45* 31*  CREATININE 7.61* 8.26* 6.47*  CALCIUM  9.3 8.8* 9.0  MG  --   --  2.4  PHOS  --   --  3.1   GFR: Estimated Creatinine Clearance: 13.6 mL/min (A) (by C-G formula based on SCr of 6.47 mg/dL (H)). Liver Function Tests: Recent Labs  Lab 10/13/23 1620 10/15/23 0411  AST 27 23  ALT 9 9  ALKPHOS 51 44  BILITOT 0.9 0.9  PROT 7.1 6.6  ALBUMIN 3.2* 3.0*   CBG: No results for input(s): "GLUCAP" in the last 168 hours.  Recent Results (from the past 240 hours)  Blood culture (routine x 2)     Status: None (Preliminary result)   Collection Time: 10/13/23  9:14 PM   Specimen: BLOOD  Result Value Ref Range Status   Specimen Description BLOOD BLOOD RIGHT ARM  Final   Special Requests   Final    BOTTLES DRAWN AEROBIC AND ANAEROBIC Blood Culture adequate volume   Culture   Final    NO GROWTH 2 DAYS Performed at [facility], [address] [place], [place] [address]    Report Status PENDING  Incomplete  Blood culture (routine x 2)     Status: None (Preliminary result)   Collection Time: 10/13/23 10:50 PM   Specimen: BLOOD  Result Value Ref Range Status   Specimen Description BLOOD BLOOD RIGHT ARM  Final   Special Requests   Final    BOTTLES DRAWN AEROBIC ONLY Blood Culture results may not be optimal due to an inadequate volume of blood received in culture bottles   Culture   Final    NO GROWTH 2  DAYS Performed at [facility], [address], [place], [place] [address]    Report Status PENDING  Incomplete  MRSA Next Gen by PCR, Nasal     Status: None   Collection Time: 10/13/23 11:09 PM  Specimen: Nasal Mucosa; Nasal Swab  Result Value Ref Range Status   MRSA by PCR Next Gen NOT DETECTED NOT DETECTED Final    Comment: (NOTE) The GeneXpert MRSA Assay (FDA approved for NASAL specimens only), is one component of a comprehensive MRSA colonization surveillance program. It is not intended to diagnose MRSA infection nor to guide or monitor treatment for MRSA infections. Test performance is not FDA approved in patients less than 51 years old. Performed at [facility], [address] [place], [place] [address]   Resp panel by RT-PCR (RSV, Flu A&B, Covid)     Status: None   Collection Time: 10/14/23  2:41 AM  Result Value Ref Range Status   SARS Coronavirus 2 by RT PCR NEGATIVE NEGATIVE Final    Comment: (NOTE) SARS-CoV-2 target nucleic acids are NOT DETECTED.  The SARS-CoV-2 RNA is generally detectable in upper respiratory specimens during the acute phase of infection. The lowest concentration of SARS-CoV-2 viral copies this assay can detect is 138 copies/mL. A negative result does not preclude SARS-Cov-2 infection and should not be used as the sole basis for treatment or other patient management decisions. A negative result may occur with  improper specimen collection/handling, submission of specimen other than nasopharyngeal swab, presence of viral mutation(s) within the areas targeted by this assay, and inadequate number of viral copies(<138 copies/mL). A negative result must be combined with clinical observations, patient history, and epidemiological information. The expected result is Negative.  Fact Sheet for Patients:  BloggerCourse.com  Fact Sheet for Healthcare Providers:   SeriousBroker.it  This test is no t yet approved or cleared by the [place]  FDA and  has been authorized for detection and/or diagnosis of SARS-CoV-2 by FDA under an Emergency Use Authorization (EUA). This EUA will remain  in effect (meaning this test can be used) for the duration of the COVID-19 declaration under Section 564(b)(1) of the Act, 21 U.S.C.section 360bbb-3(b)(1), unless the authorization is terminated  or revoked sooner.       Influenza A by PCR NEGATIVE NEGATIVE Final   Influenza B by PCR NEGATIVE NEGATIVE Final    Comment: (NOTE) The Xpert Xpress SARS-CoV-2/FLU/RSV plus assay is intended as an aid in the diagnosis of influenza from Nasopharyngeal swab specimens and should not be used as a sole basis for treatment. Nasal washings and aspirates are unacceptable for Xpert Xpress SARS-CoV-2/FLU/RSV testing.  Fact Sheet for Patients: BloggerCourse.com  Fact Sheet for Healthcare Providers: SeriousBroker.it  This test is not yet approved or cleared by the [place]  FDA and has been authorized for detection and/or diagnosis of SARS-CoV-2 by FDA under an Emergency Use Authorization (EUA). This EUA will remain in effect (meaning this test can be used) for the duration of the COVID-19 declaration under Section 564(b)(1) of the Act, 21 U.S.C. section 360bbb-3(b)(1), unless the authorization is terminated or revoked.     Resp Syncytial Virus by PCR NEGATIVE NEGATIVE Final    Comment: (NOTE) Fact Sheet for Patients: BloggerCourse.com  Fact Sheet for Healthcare Providers: SeriousBroker.it  This test is not yet approved or cleared by the [place]  FDA and has been authorized for detection and/or diagnosis of SARS-CoV-2 by FDA under an Emergency Use Authorization (EUA). This EUA will remain in effect (meaning this test can be used) for  the duration of the COVID-19 declaration under Section 564(b)(1) of the Act, 21 U.S.C. section 360bbb-3(b)(1), unless the authorization is terminated or revoked.  Performed at [facility], [address] [place], [place]  [address]      Radiology Studies: CT ABDOMEN PELVIS WO CONTRAST Result Date: 10/13/2023 CLINICAL DATA:  Nausea vomiting and short of breath EXAM: CT ABDOMEN AND PELVIS WITHOUT CONTRAST TECHNIQUE: Multidetector CT imaging of the abdomen and pelvis was performed following the standard protocol without IV contrast. RADIATION DOSE REDUCTION: This exam was performed according to the departmental dose-optimization program which includes automated exposure control, adjustment of the mA and/or kV according to patient size and/or use of iterative reconstruction technique. COMPARISON:  CT 11/03/2022, chest CT 10/13/2023 FINDINGS: Lower chest: See separately dictated chest CT. Partially visualized mass/masslike consolidation in the right lower lobe. Partial left lower lobe consolidation. Bilateral pleural effusions. Cardiomegaly. Coronary vascular calcification. Small pericardial effusion Hepatobiliary: No calcified gallstone. No focal hepatic abnormality or biliary dilatation Pancreas: Unremarkable. No pancreatic ductal dilatation or surrounding inflammatory changes. Spleen: Normal in size without focal abnormality. Adrenals/Urinary Tract: Enlarged thickened adrenal glands as before. 2.3 cm suspected left adrenal adenoma for which no imaging follow-up is recommended. Atrophic kidneys without hydronephrosis. Bilateral renal cysts for which no imaging follow-up is recommended. Punctate stones in the kidneys. Bladder is decompressed. Stomach/Bowel: Centralization of the bowel. Nondistended stomach. No dilated small bowel. Mild diffuse colon wall thickening versus under distension, chronic. No definite acute inflammatory bowel process. Vascular/Lymphatic: Advanced aortic atherosclerosis.  No aneurysm. No suspicious lymph nodes Reproductive: Negative prostate.  Fluid in the scrotum Other: No free air. Large volume abdominopelvic ascites. Generalized subcutaneous edema consistent with anasarca Musculoskeletal: No acute osseous abnormality IMPRESSION: 1. Negative for bowel obstruction or acute bowel inflammatory process. 2. Large volume abdominopelvic ascites. Generalized subcutaneous edema consistent with anasarca. 3. Cardiomegaly with small pericardial effusion. See separately dictated chest CT for additional findings in the chest 4. Atrophic kidneys with punctate stones. No hydronephrosis. 5. Aortic atherosclerosis. Aortic Atherosclerosis (ICD10-I70.0). Electronically Signed   By: Esmeralda Hedge M.D.   On: 10/13/2023 20:27   CT CHEST WO CONTRAST Result Date: 10/13/2023 CLINICAL DATA:  Abnormal xray - lung opacity/opacities. EXAM: CT CHEST WITHOUT CONTRAST TECHNIQUE: Multidetector CT imaging of the chest was performed following the standard protocol without IV contrast. RADIATION DOSE REDUCTION: This exam was performed according to the departmental dose-optimization program which includes automated exposure control, adjustment of the mA and/or kV according to patient size and/or use of iterative reconstruction technique. COMPARISON:  CT angiography chest from 07/05/2023. FINDINGS: Cardiovascular: Mild cardiomegaly. No pericardial effusion. No aortic aneurysm. There are coronary artery calcifications, in keeping with coronary artery disease. There are also mild peripheral atherosclerotic vascular calcifications of thoracic aorta and its major branches. There is dilation of the main pulmonary trunk measuring up to 3.8 cm, which is nonspecific but can be seen with pulmonary artery hypertension. Mediastinum/Nodes: Visualized thyroid  gland appears grossly unremarkable. No solid / cystic mediastinal masses. The esophagus is nondistended precluding optimal assessment. There is an enlarged right lower  paratracheal lymph node measuring 1.2 x 2.0 cm, which appears mildly enlarged since the prior study when it measured up to 0.9 x 1.8 cm. No axillary lymphadenopathy by size criteria. Evaluation of bilateral hila is limited due to lack on intravenous contrast: however, no large hilar lymphadenopathy identified. Lungs/Pleura: The central tracheo-bronchial tree is patent. There are diffuse moderate to severe centrilobular emphysematous changes throughout bilateral lungs. Since the prior study, there is interval increase in the masslike opacity in the right lung lower lobe. The lesion measures at least 6.8 x 9.5 cm, orthogonally on axial plane. The opacity extends across the right major fissure into the  posterior segment of right upper lobe. There are new heterogeneous opacities in the right lung lower lobe which may represent postobstructive pneumonia. There is small right pleural effusion, which has increased since the prior study. There are multi segmental atelectatic changes at the left lung lower lobe, which also appears increased in size since the prior study. There is small left pleural effusion which is also slightly increased. There is lobulated approximately 1.2 x 1.2 cm nodule in the left lung lower lobe which appear increased in size since the prior study when it measured up to 8 x 13 mm. Upper Abdomen: There is small-to-moderate ascites. Bilateral small/atrophic kidneys noted. There are several hypoattenuating structures in bilateral kidneys, not well evaluated on the current exam. There is small sliding hiatal hernia. Remaining visualized upper abdominal viscera within normal limits. Musculoskeletal: The visualized soft tissues of the chest wall are grossly unremarkable. No suspicious osseous lesions. Probable old healed fracture of the mid sternal body noted. There are mild multilevel degenerative changes in the visualized spine. 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 IMPRESSION: 1. Since the prior study, there is interval increase in the masslike opacity in the right lung, which is concerning for malignancy. There are new heterogeneous opacities in the right lung lower lobe, which may represent postobstructive pneumonia. Pulmonary consultation and further evaluation with contrast-enhanced chest CT scan is recommended. 2. There is also interval increase in the size of left lung lower lobe nodule. 3. There is interval increase in bilateral pleural effusions. 4. There is interval increase in the size of right lower paratracheal lymph node, which may be reactive versus metastatic. 5. Multiple other nonacute observations, as described above. Aortic Atherosclerosis (ICD10-I70.0) and Emphysema (ICD10-J43.9). Electronically Signed   By: Beula Brunswick M.D.   On: 10/13/2023 18:14   DG Chest Portable 1 View Result Date: 10/13/2023 CLINICAL DATA:  Shortness of breath EXAM: PORTABLE CHEST 1 VIEW COMPARISON:  July 05, 2023 FINDINGS: Parenchymal consolidative opacity of the right lung involving particularly right mid and right lower lobe region correlate with pneumonia. The possibility of underlying mass is not excluded due to the round lobular appearance. And findings correlate with the prior CT chest angio July 05, 2023 that demonstrated a round consolidating process of the right lower lobe. Ill-defined left lower lobe infiltrates and atelectasis. Correlation with CT recommended. Heart and mediastinum within normal limits IMPRESSION: *Parenchymal consolidative opacity of the right lung involving particularly right mid and right lower lobe region correlate with pneumonia. The possibility of underlying mass is not excluded due to the round lobular appearance. *Ill-defined left lower lobe infiltrates and atelectasis. Correlation with CT recommended. Electronically Signed   By: Fredrich Jefferson M.D.   On: 10/13/2023 16:22    Scheduled Meds:  ascorbic  acid  500 mg Oral Daily   carbamazepine   1,000 mg Oral q morning   Chlorhexidine  Gluconate Cloth  6 each Topical Q0600   cholecalciferol   1,000 Units Oral Daily   ferrous sulfate   325 mg Oral Daily   fluticasone  furoate-vilanterol  1 puff Inhalation Daily   heparin   5,000 Units Subcutaneous Q8H   ipratropium-albuterol   3 mL Nebulization TID   levothyroxine   125 mcg Oral Q0600   multivitamin  1 tablet Oral QHS   pantoprazole   40 mg Oral Daily   rosuvastatin   10 mg Oral QHS   trihexyphenidyl   2 mg Oral BID   zinc  sulfate (50mg  elemental zinc )  220 mg Oral  Daily   Continuous Infusions:  piperacillin-tazobactam (ZOSYN)  IV 2.25 g (10/15/23 0440)     LOS: 2 days  MDM: Patient is high risk for one or more organ failure.  They necessitate ongoing hospitalization for continued IV therapies and subsequent lab monitoring. Total time spent interpreting labs and vitals, reviewing the medical record, coordinating care amongst consultants and care team members, directly assessing and discussing care with the patient and/or family: 55 min  Mitsugi Schrader, DO [facility]  To contact the attending physician between 7A-7P please use Epic Chat. To contact the covering physician during after hours 7P-7A, please review Amion.  10/15/2023, 8:45 AM   *This document has been created with the assistance of dictation software. Please excuse typographical errors. *

## 2023-10-16 DIAGNOSIS — R0600 Dyspnea, unspecified: Secondary | ICD-10-CM | POA: Diagnosis not present

## 2023-10-16 DIAGNOSIS — J449 Chronic obstructive pulmonary disease, unspecified: Secondary | ICD-10-CM | POA: Diagnosis not present

## 2023-10-16 DIAGNOSIS — R188 Other ascites: Secondary | ICD-10-CM | POA: Diagnosis not present

## 2023-10-16 DIAGNOSIS — R109 Unspecified abdominal pain: Secondary | ICD-10-CM | POA: Diagnosis not present

## 2023-10-16 LAB — MAGNESIUM: Magnesium: 2.1 mg/dL (ref 1.7–2.4)

## 2023-10-16 LAB — COMPREHENSIVE METABOLIC PANEL WITH GFR
ALT: 14 U/L (ref 0–44)
AST: 23 U/L (ref 15–41)
Albumin: 2.9 g/dL — ABNORMAL LOW (ref 3.5–5.0)
Alkaline Phosphatase: 45 U/L (ref 38–126)
Anion gap: 13 (ref 5–15)
BUN: 28 mg/dL — ABNORMAL HIGH (ref 8–23)
CO2: 24 mmol/L (ref 22–32)
Calcium: 8.8 mg/dL — ABNORMAL LOW (ref 8.9–10.3)
Chloride: 97 mmol/L — ABNORMAL LOW (ref 98–111)
Creatinine, Ser: 5.21 mg/dL — ABNORMAL HIGH (ref 0.61–1.24)
GFR, Estimated: 12 mL/min — ABNORMAL LOW (ref >60)
Glucose, Bld: 71 mg/dL (ref 70–99)
Potassium: 4.3 mmol/L (ref 3.5–5.1)
Sodium: 134 mmol/L — ABNORMAL LOW (ref 135–145)
Total Bilirubin: 0.9 mg/dL (ref 0.0–1.2)
Total Protein: 6.5 g/dL (ref 6.5–8.1)

## 2023-10-16 LAB — CBC WITH DIFFERENTIAL/PLATELET
Abs Immature Granulocytes: 0.03 10*3/uL (ref 0.00–0.07)
Basophils Absolute: 0 10*3/uL (ref 0.0–0.1)
Basophils Relative: 0 %
Eosinophils Absolute: 0.1 10*3/uL (ref 0.0–0.5)
Eosinophils Relative: 2 %
HCT: 41.4 % (ref 39.0–52.0)
Hemoglobin: 13.5 g/dL (ref 13.0–17.0)
Immature Granulocytes: 1 %
Lymphocytes Relative: 11 %
Lymphs Abs: 0.6 10*3/uL — ABNORMAL LOW (ref 0.7–4.0)
MCH: 33.8 pg (ref 26.0–34.0)
MCHC: 32.6 g/dL (ref 30.0–36.0)
MCV: 103.5 fL — ABNORMAL HIGH (ref 80.0–100.0)
Monocytes Absolute: 0.7 10*3/uL (ref 0.1–1.0)
Monocytes Relative: 12 %
Neutro Abs: 3.9 10*3/uL (ref 1.7–7.7)
Neutrophils Relative %: 74 %
Platelets: 182 10*3/uL (ref 150–400)
RBC: 4 MIL/uL — ABNORMAL LOW (ref 4.22–5.81)
RDW: 13.5 % (ref 11.5–15.5)
WBC: 5.3 10*3/uL (ref 4.0–10.5)
nRBC: 0 % (ref 0.0–0.2)

## 2023-10-16 LAB — PHOSPHORUS: Phosphorus: 3.3 mg/dL (ref 2.5–4.6)

## 2023-10-16 MED ORDER — AMOXICILLIN-POT CLAVULANATE 500-125 MG PO TABS: 1.0000 | ORAL_TABLET | Freq: Every day | ORAL | 0 refills | Status: DC

## 2023-10-16 MED ORDER — AMOXICILLIN-POT CLAVULANATE 500-125 MG PO TABS
1.0000 | ORAL_TABLET | Freq: Every day | ORAL | Status: DC
  Administered 2023-10-17 – 2023-10-19 (×3): 1 via ORAL
  Filled 2023-10-16 (×3): qty 1

## 2023-10-16 NOTE — Progress Notes (Signed)
 PROGRESS NOTE    Todd Brown  ZOX:[id number] DOB: Jun 29, 1960 DOA: 10/13/2023 PCP: Janann Meadow, NP  No chief complaint on file.   Hospital Course:  Todd Brown is a 63 year old male with ESRD-HD MWF, schizophrenia, hypertension, hyperlipidemia, COPD with chronic hypoxic respiratory failure on 2 L, CAD, recurrent ascites requiring prior paracentesis, hypothyroidism, GERD, remote right leg DVT 2018 not on AC, prior cardiac arrest, prior strep bacteremia with negative TEE, blindness, who presents on this admission from his group home complaining of shortness of breath, nausea, vomiting, abdominal pain and distention.  Patient missed his dialysis on 5/16 due to abdominal pain.  In the ED he was found to have increasing O2 requirement, to 4 L.  Labs consistent with ESRD.  Blood pressure 89/63, heart rate 74, respiratory rate 25.  Chest x-ray revealed right lung opacity and left lower lobe infiltrates.  CT chest confirms interval increase in masslike opacity in right lung most concerning for malignancy as well as new heterogenous opacities in the right lower lobe which could represent postobstructive pneumonia.  There is also an interval increase in the left lung lower lobe nodule and bilateral pleural effusions.  Patient was admitted for further workup.  Oncology was consulted.  Subjective: No acute events overnight. On evaluation today patient has no acute complaints.  He is waiting for lunch.    Objective: Vitals:   10/16/23 0359 10/16/23 0500 10/16/23 0703 10/16/23 0735  BP: (!) 152/89   120/77  Pulse: 70   67  Resp: 20   15  Temp: 98.4 F (36.9 C)   98.6 F (37 C)  TempSrc: Oral   Oral  SpO2: 96%  98% 100%  Weight:  81.6 kg    Height:        Intake/Output Summary (Last 24 hours) at 10/16/2023 0919 Last data filed at 10/16/2023 0600 Gross per 24 hour  Intake 300 ml  Output 1500 ml  Net -1200 ml   Filed Weights   10/15/23 1330 10/15/23 1735 10/16/23 0500  Weight: 82.6  kg 81.1 kg 81.6 kg    Examination: General exam: Appears calm and comfortable, NAD  Respiratory system: No work of breathing, symmetric chest wall expansion Cardiovascular system: S1 & S2 heard, RRR.  Gastrointestinal system: Abdomen is distended, nontender. Neuro: Alert, speech unintelligible at times, answer questions consistently.  He requires prompting. Extremities: Symmetric, expected ROM Skin: No rashes, lesions Psychiatry: Difficult to assess.  Calm.  Assessment & Plan:  Principal Problem:   Postobstructive pneumonia Active Problems:   Community acquired pneumonia   Lung mass   COPD (chronic obstructive pulmonary disease) (HCC)   Abdominal pain   Ascites   ESRD on hemodialysis (HCC)   HLD (hyperlipidemia)   Essential hypertension   CAD (coronary artery disease)   Hypothyroidism   Schizophrenia (HCC)   Blindness of both eyes   Dyspnea    Postobstructive pneumonia Acute on chronic hypoxic respiratory failure - At baseline patient requires 2 L O2, was tachypneic on arrival requiring 4 L. - Now back to his respiratory baseline - Remains without fever or leukocytosis - Obstruction secondary to mass, likely malignancy.  No hilar lymphadenopathy - Has been on vancomycin  and Zosyn .  MRSA PCR negative.  Just on Zosyn .  Will discontinue this now in favor of Augmentin  - Encourage incentive spirometry though this is limited due to patient's abilities - Continue symptomatic support Mucinex  and bronchodilators as needed - Unable to perform Legionella and strep pneumo as patient is an  uric - Blood and sputum cultures taken, negative to date - Respiratory viral panel negative  Lung mass - Seen on CT with interval increase in size, 6.8 x 9.5 cm extending across the right major fissure into the posterior segment of the right upper lung - Oncology has been consulted.  Suspect that this is malignancy and recommends palliative care - Patient is not a candidate for aggressive  chemotherapy or surgical resection - Palliative care was consulted - Have had extensive discussion with the patient's legal guardian, Candice, and she reports they do not plan to pursue aggressive therapy.  She is interested in palliative care outpatient.  She is not interested in hospice at this time as they do not intend to discontinue hemodialysis  Bilateral pleural effusions - Small.  Expect further improvement with hemodialysis -Currently at baseline respiratory status  COPD - Continue with bronchodilators and Mucinex  as above - Not in exacerbation - As needed bronchodilators and Mucinex   Abdominal ascites - Patient with abdominal distention and pain.  CT abdomen pelvis with large volume ascites and subcutaneous edema consistent with anasarca - Have requested for paracentesis, received albumin  5/15.  Consistently holding a MAP above 65 - Legal guardian consented to paracentesis - Suspect this will happen on Monday now.   History of hepatitis C. - Chronic, unclear treatment.  Has required paracentesis in the past - LFTs within normal limits.  Ammonia unremarkable - Does have significant ascites. - Paracentesis ordered as above, anticipate this will occur 5/19  ESRD on HD MWF - Nephrology consulted for dialysis during this admission - Has working AV fistula - Continue close monitoring of electrolytes.  Schizophrenia - Resume home meds  Hypertension - Continue to monitor closely, titrate meds as needed - Currently BP at goal  Hyperlipidemia - Statin  Hypothyroidism - Resume home dose levothyroxine   CAD - Stable.  Resume home meds  GERD - PPI  Blindness  Resides at a group home - Legal guardian to visit and discuss prognosis with patient  Adrenal adenoma - 2.3 cm suspected left adrenal adenoma seen on CT.  No follow-up imaging recommended  Poor prognosis - Had extensive discussion with patient's legal guardian, Candice, today.  At this time his legal  guardians are clear that they will not be pursuing aggressive intervention, or cancer workup.  They plan to discharge the patient with palliative care services.  Patient has good quality of life at his assisted living facility, and they would like to continue with these arrangements and continue with hemodialysis.  We did discuss that untreated cancer is likely to spread and patient likely has future hospitalizations ahead.  She endorses understanding.  Will revisit conversations of hospice if patient's health declines on future visits.  For now, TOC has been consulted to assist in arrangements back to ALF after patient has received paracentesis.  She is also working to arrange outpatient palliative care services    DVT prophylaxis: Heparin    Code Status: Limited: Do not attempt resuscitation (DNR) -DNR-LIMITED -Do Not Intubate/DNI  Disposition: Inpatient pending paracentesis 5/19.  Will eventually discharge back to ALF with palliative care services  consultants: Nephrology    Procedures:    Antimicrobials:  Anti-infectives (From admission, onward)    Start     Dose/Rate Route Frequency Ordered Stop   10/13/23 2345  vancomycin  (VANCOREADY) IVPB 1500 mg/300 mL        1,500 mg 150 mL/hr over 120 Minutes Intravenous  Once 10/13/23 2256 10/14/23 0914   10/13/23 2100  piperacillin -tazobactam (ZOSYN ) IVPB 2.25 g        2.25 g 100 mL/hr over 30 Minutes Intravenous Every 8 hours 10/13/23 2024     10/13/23 2030  vancomycin  (VANCOREADY) IVPB 1500 mg/300 mL  Status:  Discontinued        1,500 mg 150 mL/hr over 120 Minutes Intravenous  Once 10/13/23 2024 10/13/23 2256   10/13/23 2024  vancomycin  variable dose per unstable renal function (pharmacist dosing)  Status:  Discontinued         Does not apply See admin instructions 10/13/23 2024 10/14/23 0959   10/13/23 1830  cefTRIAXone  (ROCEPHIN ) 1 g in sodium chloride  0.9 % 100 mL IVPB  Status:  Discontinued        1 g 200 mL/hr over 30 Minutes  Intravenous  Once 10/13/23 1828 10/13/23 2024   10/13/23 1830  azithromycin  (ZITHROMAX ) 500 mg in sodium chloride  0.9 % 250 mL IVPB  Status:  Discontinued        500 mg 250 mL/hr over 60 Minutes Intravenous  Once 10/13/23 1828 10/13/23 2024       Data Reviewed: I have personally reviewed following labs and imaging studies CBC: Recent Labs  Lab 10/13/23 1620 10/14/23 0610 10/15/23 0411 10/16/23 0439  WBC 6.3 6.1 4.7 5.3  NEUTROABS  --   --  3.1 3.9  HGB 13.9 12.9* 13.4 13.5  HCT 41.9 37.1* 39.5 41.4  MCV 102.2* 98.9 102.1* 103.5*  PLT 223 204 144* 182   Basic Metabolic Panel: Recent Labs  Lab 10/13/23 1620 10/14/23 0610 10/15/23 0411 10/16/23 0439  NA 135 135 135 134*  K 4.0 4.0 4.4 4.3  CL 95* 95* 96* 97*  CO2 26 26 22 24   GLUCOSE 97 85 76 71  BUN 39* 45* 31* 28*  CREATININE 7.61* 8.26* 6.47* 5.21*  CALCIUM  9.3 8.8* 9.0 8.8*  MG  --   --  2.4 2.1  PHOS  --   --  3.1 3.3   GFR: Estimated Creatinine Clearance: 16.7 mL/min (A) (by C-G formula based on SCr of 5.21 mg/dL (H)). Liver Function Tests: Recent Labs  Lab 10/13/23 1620 10/15/23 0411 10/16/23 0439  AST 27 23 23   ALT 9 9 14   ALKPHOS 51 44 45  BILITOT 0.9 0.9 0.9  PROT 7.1 6.6 6.5  ALBUMIN  3.2* 3.0* 2.9*   CBG: Recent Labs  Lab 10/15/23 1836  GLUCAP 99    Recent Results (from the past 240 hours)  Blood culture (routine x 2)     Status: None (Preliminary result)   Collection Time: 10/13/23  9:14 PM   Specimen: BLOOD  Result Value Ref Range Status   Specimen Description BLOOD BLOOD RIGHT ARM  Final   Special Requests   Final    BOTTLES DRAWN AEROBIC AND ANAEROBIC Blood Culture adequate volume   Culture   Final    NO GROWTH 3 DAYS Performed at [facility], [address] [place], [place] [address]    Report Status PENDING  Incomplete  Blood culture (routine x 2)     Status: None (Preliminary result)   Collection Time: 10/13/23 10:50 PM   Specimen: BLOOD  Result Value Ref Range  Status   Specimen Description BLOOD BLOOD RIGHT ARM  Final   Special Requests   Final    BOTTLES DRAWN AEROBIC ONLY Blood Culture results may not be optimal due to an inadequate volume of blood received in culture bottles   Culture   Final  NO GROWTH 3 DAYS Performed at [facility], [address] [place], [place] [address]    Report Status PENDING  Incomplete  MRSA Next Gen by PCR, Nasal     Status: None   Collection Time: 10/13/23 11:09 PM   Specimen: Nasal Mucosa; Nasal Swab  Result Value Ref Range Status   MRSA by PCR Next Gen NOT DETECTED NOT DETECTED Final    Comment: (NOTE) The GeneXpert MRSA Assay (FDA approved for NASAL specimens only), is one component of a comprehensive MRSA colonization surveillance program. It is not intended to diagnose MRSA infection nor to guide or monitor treatment for MRSA infections. Test performance is not FDA approved in patients less than 56 years old. Performed at [facility], [address] [place], [place] [address]   Resp panel by RT-PCR (RSV, Flu A&B, Covid)     Status: None   Collection Time: 10/14/23  2:41 AM  Result Value Ref Range Status   SARS Coronavirus 2 by RT PCR NEGATIVE NEGATIVE Final    Comment: (NOTE) SARS-CoV-2 target nucleic acids are NOT DETECTED.  The SARS-CoV-2 RNA is generally detectable in upper respiratory specimens during the acute phase of infection. The lowest concentration of SARS-CoV-2 viral copies this assay can detect is 138 copies/mL. A negative result does not preclude SARS-Cov-2 infection and should not be used as the sole basis for treatment or other patient management decisions. A negative result may occur with  improper specimen collection/handling, submission of specimen other than nasopharyngeal swab, presence of viral mutation(s) within the areas targeted by this assay, and inadequate number of viral copies(<138 copies/mL). A negative result must be combined with clinical  observations, patient history, and epidemiological information. The expected result is Negative.  Fact Sheet for Patients:  BloggerCourse.com  Fact Sheet for Healthcare Providers:  SeriousBroker.it  This test is no t yet approved or cleared by the [place]  FDA and  has been authorized for detection and/or diagnosis of SARS-CoV-2 by FDA under an Emergency Use Authorization (EUA). This EUA will remain  in effect (meaning this test can be used) for the duration of the COVID-19 declaration under Section 564(b)(1) of the Act, 21 U.S.C.section 360bbb-3(b)(1), unless the authorization is terminated  or revoked sooner.       Influenza A by PCR NEGATIVE NEGATIVE Final   Influenza B by PCR NEGATIVE NEGATIVE Final    Comment: (NOTE) The Xpert Xpress SARS-CoV-2/FLU/RSV plus assay is intended as an aid in the diagnosis of influenza from Nasopharyngeal swab specimens and should not be used as a sole basis for treatment. Nasal washings and aspirates are unacceptable for Xpert Xpress SARS-CoV-2/FLU/RSV testing.  Fact Sheet for Patients: BloggerCourse.com  Fact Sheet for Healthcare Providers: SeriousBroker.it  This test is not yet approved or cleared by the [place]  FDA and has been authorized for detection and/or diagnosis of SARS-CoV-2 by FDA under an Emergency Use Authorization (EUA). This EUA will remain in effect (meaning this test can be used) for the duration of the COVID-19 declaration under Section 564(b)(1) of the Act, 21 U.S.C. section 360bbb-3(b)(1), unless the authorization is terminated or revoked.     Resp Syncytial Virus by PCR NEGATIVE NEGATIVE Final    Comment: (NOTE) Fact Sheet for Patients: BloggerCourse.com  Fact Sheet for Healthcare Providers: SeriousBroker.it  This test is not yet approved or cleared by  the [place]  FDA and has been authorized for detection and/or diagnosis of SARS-CoV-2 by FDA under an Emergency Use Authorization (EUA). This EUA  will remain in effect (meaning this test can be used) for the duration of the COVID-19 declaration under Section 564(b)(1) of the Act, 21 U.S.C. section 360bbb-3(b)(1), unless the authorization is terminated or revoked.  Performed at [facility], [address] [place], [place] [address]      Radiology Studies: No results found.   Scheduled Meds:  ascorbic acid   500 mg Oral Daily   carbamazepine   1,000 mg Oral q morning   Chlorhexidine  Gluconate Cloth  6 each Topical Q0600   cholecalciferol   1,000 Units Oral Daily   feeding supplement  237 mL Oral BID BM   ferrous sulfate   325 mg Oral Daily   fluticasone  furoate-vilanterol  1 puff Inhalation Daily   heparin   5,000 Units Subcutaneous Q8H   levothyroxine   125 mcg Oral Q0600   multivitamin  1 tablet Oral QHS   pantoprazole   40 mg Oral Daily   rosuvastatin   10 mg Oral QHS   trihexyphenidyl   2 mg Oral BID   zinc  sulfate (50mg  elemental zinc )  220 mg Oral Daily   Continuous Infusions:  piperacillin -tazobactam (ZOSYN )  IV 2.25 g (10/16/23 0532)     LOS: 3 days  MDM: Patient is high risk for one or more organ failure.  They necessitate ongoing hospitalization for continued IV therapies and subsequent lab monitoring. Total time spent interpreting labs and vitals, reviewing the medical record, coordinating care amongst consultants and care team members, directly assessing and discussing care with the patient and/or family: 55 min  Fintan Grater, DO [facility]  To contact the attending physician between 7A-7P please use Epic Chat. To contact the covering physician during after hours 7P-7A, please review Amion.  10/16/2023, 9:19 AM   *This document has been created with the assistance of dictation software. Please excuse typographical errors. *

## 2023-10-16 NOTE — Hospital Course (Addendum)
 Todd Brown is a 63 year old male with ESRD-HD MWF, schizophrenia, hypertension, hyperlipidemia, COPD with chronic hypoxic respiratory failure on 2 L, CAD, recurrent ascites requiring prior paracentesis, hypothyroidism, GERD, remote right leg DVT 2018 not on AC, prior cardiac arrest, prior strep bacteremia with negative TEE, blindness, who presents on this admission from his group home complaining of shortness of breath, nausea, vomiting, abdominal pain and distention.  Patient missed his dialysis on 5/16 due to abdominal pain.  In the ED he was found to have increasing O2 requirement, to 4 L.  Labs consistent with ESRD.  Blood pressure 89/63, heart rate 74, respiratory rate 25.  Chest x-ray revealed right lung opacity and left lower lobe infiltrates.  CT chest confirms interval increase in masslike opacity in right lung most concerning for malignancy as well as new heterogenous opacities in the right lower lobe which could represent postobstructive pneumonia.  There is also an interval increase in the left lung lower lobe nodule and bilateral pleural effusions.  Patient was admitted for further workup.  Oncology was consulted. Patient started on antibiotics and respiratory status improved significantly.  MRSA PCR found to be negative.  Antibiotics de-escalated to renally dosed Augmentin  which he will need to continue at discharge through 5/24. Regarding his lung mass and concern for malignancy, after discussion with oncology it appears that patient is a poor candidate for aggressive cancer treatment.  We involved palliative care. Had extensive discussion with the patient's legal guardian, Todd Brown, on 5/17.  We discussed palliative care vs hospice and what these different things would mean for [name]. At this time [name] has profound quality of life, and his legal guardians are not interested in discontinuing hemodyalsis..  They do however agree that they do not want to consider aggressive  treatment or intensive therapies for this malignancy.  They would like to continue with his regular hemodialysis

## 2023-10-16 NOTE — Plan of Care (Signed)

## 2023-10-16 NOTE — TOC Progression Note (Addendum)
 Transition of Care [facility]) - Progression Note    Patient Details  Name: Todd Brown MRN: [id number] Date of Birth: 1961/02/25  Transition of Care [employer]) CM/SW Contact  Joslyn Nim, RN Phone Number: 10/16/2023, 1:09 PM  Clinical Narrative:    CM received a message this morning that patient is medical ready for d/c back to his facility. MD states that she had spoke with patient's legal guardian, Candiace Gobble. MD plan to order palliative care.   [id number] CM spoke with Mr. Francisco Irving about patient being d/c today with Palliative care. He is in agreement with accepting patient and request that [facility] is arranged for Palliative care. He will transport patient to the facility this evening about 5pm  1000: Cm called legal guardian Candiace Gobble. She is away of the d/c with palliative care. CM informed her that the facility will take patient today and requested [facility] for palliative care. CM called  [facility] intake and received a call from [place]. She asked that demographic, H&P, recent progress note, MAR, and palliative care order be faxed to [phone].  CM  faxed all clinica except order. CM is waiting for order.    1340- patient not d/c today.   Patient had paracentesis order. Just received consent from legal guardian. CM notified Mr. Katherina Pan         Expected Discharge Plan and Services                                               Social Determinants of Health (SDOH) Interventions SDOH Screenings   Food Insecurity: No Food Insecurity (10/14/2023)  Housing: Low Risk  (10/13/2023)  Transportation Needs: Patient Unable To Answer (10/14/2023)  Recent Concern: Transportation Needs - Unmet Transportation Needs (10/13/2023)  Utilities: Not At Risk (10/13/2023)  Alcohol Screen: Low Risk  (10/05/2019)  Financial Resource Strain: Low Risk  (10/08/2018)  Physical Activity: Unknown (10/08/2018)  Social Connections: Unknown (10/08/2018)  Stress: No Stress Concern Present  (10/08/2018)  Tobacco Use: Low Risk  (10/13/2023)    Readmission Risk Interventions    10/15/2023    2:37 PM 07/06/2023    9:31 AM 06/17/2023    2:42 PM  Readmission Risk Prevention Plan  Transportation Screening Complete Complete Complete  PCP or Specialist Appt within 3-5 Days  Complete   Social Work Consult for Recovery Care Planning/Counseling  Complete   Palliative Care Screening  Complete   Medication Review [employer])  Complete Complete  PCP or Specialist appointment within 3-5 days of discharge   Complete  SW Recovery Care/Counseling Consult Complete  Complete  Palliative Care Screening Complete  Not Applicable  Skilled Nursing Facility   Not Applicable

## 2023-10-16 NOTE — Progress Notes (Signed)
 [facility]  PROGRESS NOTE   Subjective:   Awake and alert.  Objective:  Vital signs: Blood pressure 120/77, pulse 67, temperature 98.6 F (37 C), temperature source Oral, resp. rate 15, height 6\' 3"  (1.905 m), weight 81.6 kg, SpO2 100%.  Intake/Output Summary (Last 24 hours) at 10/16/2023 1243 Last data filed at 10/16/2023 1100 Gross per 24 hour  Intake 540 ml  Output 1500 ml  Net -960 ml   Filed Weights   10/15/23 1330 10/15/23 1735 10/16/23 0500  Weight: 82.6 kg 81.1 kg 81.6 kg     Physical Exam: General:  No acute distress  Head:  Normocephalic, atraumatic. Moist oral mucosal membranes  Eyes:  Anicteric  Neck:  Supple  Lungs:   Clear to auscultation, normal effort  Heart:  S1S2 no rubs  Abdomen:   Soft, nontender, bowel sounds present  Extremities:  peripheral edema.  Neurologic:  Awake, alert, following commands  Skin:  No lesions  Access:     Basic Metabolic Panel: Recent Labs  Lab 10/13/23 1620 10/14/23 0610 10/15/23 0411 10/16/23 0439  NA 135 135 135 134*  K 4.0 4.0 4.4 4.3  CL 95* 95* 96* 97*  CO2 26 26 22 24   GLUCOSE 97 85 76 71  BUN 39* 45* 31* 28*  CREATININE 7.61* 8.26* 6.47* 5.21*  CALCIUM  9.3 8.8* 9.0 8.8*  MG  --   --  2.4 2.1  PHOS  --   --  3.1 3.3   GFR: Estimated Creatinine Clearance: 16.7 mL/min (A) (by C-G formula based on SCr of 5.21 mg/dL (H)).  Liver Function Tests: Recent Labs  Lab 10/13/23 1620 10/15/23 0411 10/16/23 0439  AST 27 23 23   ALT 9 9 14   ALKPHOS 51 44 45  BILITOT 0.9 0.9 0.9  PROT 7.1 6.6 6.5  ALBUMIN  3.2* 3.0* 2.9*   Recent Labs  Lab 10/13/23 1620  LIPASE 26   Recent Labs  Lab 10/13/23 2250  AMMONIA <13    CBC: Recent Labs  Lab 10/13/23 1620 10/14/23 0610 10/15/23 0411 10/16/23 0439  WBC 6.3 6.1 4.7 5.3  NEUTROABS  --   --  3.1 3.9  HGB 13.9 12.9* 13.4 13.5  HCT 41.9 37.1* 39.5 41.4  MCV 102.2* 98.9 102.1* 103.5*  PLT 223 204 144* 182     HbA1C: Hgb A1c MFr Bld   Date/Time Value Ref Range Status  11/03/2022 11:45 AM 5.9 (H) 4.8 - 5.6 % Final    Comment:    (NOTE)         Prediabetes: 5.7 - 6.4         Diabetes: >6.4         Glycemic control for adults with diabetes: <7.0   09/12/2018 05:01 AM 5.0 4.8 - 5.6 % Final    Comment:    (NOTE)         Prediabetes: 5.7 - 6.4         Diabetes: >6.4         Glycemic control for adults with diabetes: <7.0     Urinalysis: No results for input(s): "COLORURINE", "LABSPEC", "PHURINE", "GLUCOSEU", "HGBUR", "BILIRUBINUR", "KETONESUR", "PROTEINUR", "UROBILINOGEN", "NITRITE", "LEUKOCYTESUR" in the last 72 hours.  Invalid input(s): "APPERANCEUR"    Imaging: No results found.   Medications:    piperacillin -tazobactam (ZOSYN )  IV 2.25 g (10/16/23 0532)    ascorbic acid   500 mg Oral Daily   carbamazepine   1,000 mg Oral q morning   Chlorhexidine  Gluconate Cloth  6 each  Topical Q0600   cholecalciferol   1,000 Units Oral Daily   feeding supplement  237 mL Oral BID BM   ferrous sulfate   325 mg Oral Daily   fluticasone  furoate-vilanterol  1 puff Inhalation Daily   heparin   5,000 Units Subcutaneous Q8H   levothyroxine   125 mcg Oral Q0600   multivitamin  1 tablet Oral QHS   pantoprazole   40 mg Oral Daily   rosuvastatin   10 mg Oral QHS   trihexyphenidyl   2 mg Oral BID   zinc  sulfate (50mg  elemental zinc )  220 mg Oral Daily    Assessment/ Plan:     63 year old male with history of COPD, hypertension, DVT, schizophrenia and end-stage renal disease on dialysis now admitted with history of shortness of breath, abdominal pain, nausea, vomiting and was found to have pneumonia.  He had stable dialysis yesterday.  Patient is on a Monday Wednesday Friday schedule.  #1: ESRD: Patient had stable dialysis yesterday.  Tolerated dialysis for AV fistula with 1 and half liter of fluid removed.  #2: Anemia: Will continue iron  and erythropoietin .  #3: Secondary hyperparathyroidism: Will continue to monitor  closely.  #4: Pneumonia/lung mass: Patient has postobstructive pneumonia.  He has been on Zosyn  2.25 g.  Continue supportive care  Labs and medications reviewed. Will continue to follow along with you.   LOS: 3 Leeta Grimme, MD [facility] 5/17/202512:43 PM

## 2023-10-17 DIAGNOSIS — R0600 Dyspnea, unspecified: Secondary | ICD-10-CM | POA: Diagnosis not present

## 2023-10-17 DIAGNOSIS — J189 Pneumonia, unspecified organism: Secondary | ICD-10-CM | POA: Diagnosis not present

## 2023-10-17 LAB — CBC WITH DIFFERENTIAL/PLATELET
Abs Immature Granulocytes: 0.04 10*3/uL (ref 0.00–0.07)
Basophils Absolute: 0 10*3/uL (ref 0.0–0.1)
Basophils Relative: 1 %
Eosinophils Absolute: 0.1 10*3/uL (ref 0.0–0.5)
Eosinophils Relative: 2 %
HCT: 41.2 % (ref 39.0–52.0)
Hemoglobin: 13.7 g/dL (ref 13.0–17.0)
Immature Granulocytes: 1 %
Lymphocytes Relative: 15 %
Lymphs Abs: 0.9 10*3/uL (ref 0.7–4.0)
MCH: 33.5 pg (ref 26.0–34.0)
MCHC: 33.3 g/dL (ref 30.0–36.0)
MCV: 100.7 fL — ABNORMAL HIGH (ref 80.0–100.0)
Monocytes Absolute: 0.8 10*3/uL (ref 0.1–1.0)
Monocytes Relative: 13 %
Neutro Abs: 4.1 10*3/uL (ref 1.7–7.7)
Neutrophils Relative %: 68 %
Platelets: 237 10*3/uL (ref 150–400)
RBC: 4.09 MIL/uL — ABNORMAL LOW (ref 4.22–5.81)
RDW: 13.4 % (ref 11.5–15.5)
WBC: 5.9 10*3/uL (ref 4.0–10.5)
nRBC: 0 % (ref 0.0–0.2)

## 2023-10-17 LAB — PHOSPHORUS: Phosphorus: 3.9 mg/dL (ref 2.5–4.6)

## 2023-10-17 LAB — COMPREHENSIVE METABOLIC PANEL WITH GFR
ALT: 13 U/L (ref 0–44)
AST: 23 U/L (ref 15–41)
Albumin: 3.1 g/dL — ABNORMAL LOW (ref 3.5–5.0)
Alkaline Phosphatase: 49 U/L (ref 38–126)
Anion gap: 15 (ref 5–15)
BUN: 38 mg/dL — ABNORMAL HIGH (ref 8–23)
CO2: 26 mmol/L (ref 22–32)
Calcium: 9.3 mg/dL (ref 8.9–10.3)
Chloride: 95 mmol/L — ABNORMAL LOW (ref 98–111)
Creatinine, Ser: 6.29 mg/dL — ABNORMAL HIGH (ref 0.61–1.24)
GFR, Estimated: 9 mL/min — ABNORMAL LOW (ref >60)
Glucose, Bld: 77 mg/dL (ref 70–99)
Potassium: 4.7 mmol/L (ref 3.5–5.1)
Sodium: 136 mmol/L (ref 135–145)
Total Bilirubin: 1 mg/dL (ref 0.0–1.2)
Total Protein: 7.1 g/dL (ref 6.5–8.1)

## 2023-10-17 LAB — MAGNESIUM: Magnesium: 2.3 mg/dL (ref 1.7–2.4)

## 2023-10-17 MED ORDER — GUAIFENESIN-DM 100-10 MG/5ML PO SYRP
15.0000 mL | ORAL_SOLUTION | Freq: Four times a day (QID) | ORAL | Status: DC | PRN
  Administered 2023-10-17: 15 mL via ORAL
  Filled 2023-10-17: qty 20

## 2023-10-17 NOTE — Progress Notes (Signed)
 [facility]  PROGRESS NOTE   Subjective:   Awake and alert but confused  Objective:  Vital signs: Blood pressure (!) 146/97, pulse 88, temperature 98.5 F (36.9 C), temperature source Oral, resp. rate 18, height 6\' 3"  (1.905 m), weight 79.7 kg, SpO2 91%.  Intake/Output Summary (Last 24 hours) at 10/17/2023 1408 Last data filed at 10/16/2023 1632 Gross per 24 hour  Intake 54.83 ml  Output --  Net 54.83 ml   Filed Weights   10/15/23 1735 10/16/23 0500 10/17/23 0349  Weight: 81.1 kg 81.6 kg 79.7 kg     Physical Exam: General:  No acute distress  Head:  Normocephalic, atraumatic. Moist oral mucosal membranes  Eyes:  Anicteric  Neck:  Supple  Lungs:   Clear to auscultation, normal effort  Heart:  S1S2 no rubs  Abdomen:   Soft, nontender, bowel sounds present  Extremities:  peripheral edema.  Neurologic:  Awake, alert, following commands  Skin:  No lesions  Access:     Basic Metabolic Panel: Recent Labs  Lab 10/13/23 1620 10/14/23 0610 10/15/23 0411 10/16/23 0439 10/17/23 0418  NA 135 135 135 134* 136  K 4.0 4.0 4.4 4.3 4.7  CL 95* 95* 96* 97* 95*  CO2 26 26 22 24 26   GLUCOSE 97 85 76 71 77  BUN 39* 45* 31* 28* 38*  CREATININE 7.61* 8.26* 6.47* 5.21* 6.29*  CALCIUM  9.3 8.8* 9.0 8.8* 9.3  MG  --   --  2.4 2.1 2.3  PHOS  --   --  3.1 3.3 3.9   GFR: Estimated Creatinine Clearance: 13.6 mL/min (A) (by C-G formula based on SCr of 6.29 mg/dL (H)).  Liver Function Tests: Recent Labs  Lab 10/13/23 1620 10/15/23 0411 10/16/23 0439 10/17/23 0418  AST 27 23 23 23   ALT 9 9 14 13   ALKPHOS 51 44 45 49  BILITOT 0.9 0.9 0.9 1.0  PROT 7.1 6.6 6.5 7.1  ALBUMIN  3.2* 3.0* 2.9* 3.1*   Recent Labs  Lab 10/13/23 1620  LIPASE 26   Recent Labs  Lab 10/13/23 2250  AMMONIA <13    CBC: Recent Labs  Lab 10/13/23 1620 10/14/23 0610 10/15/23 0411 10/16/23 0439 10/17/23 0418  WBC 6.3 6.1 4.7 5.3 5.9  NEUTROABS  --   --  3.1 3.9 4.1  HGB 13.9 12.9*  13.4 13.5 13.7  HCT 41.9 37.1* 39.5 41.4 41.2  MCV 102.2* 98.9 102.1* 103.5* 100.7*  PLT 223 204 144* 182 237     HbA1C: Hgb A1c MFr Bld  Date/Time Value Ref Range Status  11/03/2022 11:45 AM 5.9 (H) 4.8 - 5.6 % Final    Comment:    (NOTE)         Prediabetes: 5.7 - 6.4         Diabetes: >6.4         Glycemic control for adults with diabetes: <7.0   09/12/2018 05:01 AM 5.0 4.8 - 5.6 % Final    Comment:    (NOTE)         Prediabetes: 5.7 - 6.4         Diabetes: >6.4         Glycemic control for adults with diabetes: <7.0     Urinalysis: No results for input(s): "COLORURINE", "LABSPEC", "PHURINE", "GLUCOSEU", "HGBUR", "BILIRUBINUR", "KETONESUR", "PROTEINUR", "UROBILINOGEN", "NITRITE", "LEUKOCYTESUR" in the last 72 hours.  Invalid input(s): "APPERANCEUR"    Imaging: No results found.   Medications:     amoxicillin -clavulanate  1 tablet  Oral Daily   ascorbic acid   500 mg Oral Daily   carbamazepine   1,000 mg Oral q morning   Chlorhexidine  Gluconate Cloth  6 each Topical Q0600   cholecalciferol   1,000 Units Oral Daily   feeding supplement  237 mL Oral BID BM   ferrous sulfate   325 mg Oral Daily   fluticasone  furoate-vilanterol  1 puff Inhalation Daily   heparin   5,000 Units Subcutaneous Q8H   levothyroxine   125 mcg Oral Q0600   multivitamin  1 tablet Oral QHS   pantoprazole   40 mg Oral Daily   rosuvastatin   10 mg Oral QHS   trihexyphenidyl   2 mg Oral BID   zinc  sulfate (50mg  elemental zinc )  220 mg Oral Daily    Assessment/ Plan:     63 year old male with history of COPD, hypertension, DVT, schizophrenia and end-stage renal disease on dialysis now admitted with history of shortness of breath, abdominal pain, nausea, vomiting and was found to have pneumonia.  He had stable dialysis yesterday.  Patient is on a Monday Wednesday Friday schedule.   #1: ESRD: Patient had stable dialysis on Friday.  Will dialyze again tomorrow.     #2: Anemia: Will continue iron  and  erythropoietin .   #3: Secondary hyperparathyroidism: Will continue to monitor closely.   #4: Pneumonia/lung mass: Patient has postobstructive pneumonia.  He has been on Zosyn  2.25 g.   Continue supportive care  Labs and medications reviewed. Will continue to follow along with you.   LOS: 4 Baily Hovanec, MD [facility] 5/18/20252:08 PM

## 2023-10-17 NOTE — Plan of Care (Signed)
  Problem: Pain Managment: Goal: General experience of comfort will improve and/or be controlled Outcome: Progressing   Problem: Safety: Goal: Ability to remain free from injury will improve Outcome: Progressing   Problem: Skin Integrity: Goal: Risk for impaired skin integrity will decrease Outcome: Progressing

## 2023-10-17 NOTE — Progress Notes (Signed)
 Sports coach Note  New referral received from Todd Brown, [facility] for outpatient palliative services at patien'ts ALF/group home (T[facility]) at discharge.  Patient is currently a ward of the state and has a DSS guardian.  Patient may discharge home tomorrow.    Hospital liaison team will continue to follow through final disposition.  Thank you for allowing participation in this patient's care.  Ambrosio Junker, RN Nurse Liaison [phone]

## 2023-10-17 NOTE — TOC Progression Note (Signed)
 Transition of Care [facility]) - Progression Note    Patient Details  Name: Todd Brown MRN: [id number] Date of Birth: November 21, 1960  [facility] CM/SW Contact  Joslyn Nim, RN Phone Number: 10/17/2023, 1:20 PM  Clinical Narrative:    [facility]  in [place] does not have Palliative care. CM  spoke with [place] at [facility].         Expected Discharge Plan and Services                                               Social Determinants of Health (SDOH) Interventions SDOH Screenings   Food Insecurity: No Food Insecurity (10/14/2023)  Housing: Low Risk  (10/13/2023)  Transportation Needs: Patient Unable To Answer (10/14/2023)  Recent Concern: Transportation Needs - Unmet Transportation Needs (10/13/2023)  Utilities: Not At Risk (10/13/2023)  Alcohol Screen: Low Risk  (10/05/2019)  Financial Resource Strain: Low Risk  (10/08/2018)  Physical Activity: Unknown (10/08/2018)  Social Connections: Unknown (10/08/2018)  Stress: No Stress Concern Present (10/08/2018)  Tobacco Use: Low Risk  (10/13/2023)    Readmission Risk Interventions    10/15/2023    2:37 PM 07/06/2023    9:31 AM 06/17/2023    2:42 PM  Readmission Risk Prevention Plan  Transportation Screening Complete Complete Complete  PCP or Specialist Appt within 3-5 Days  Complete   Social Work Consult for Recovery Care Planning/Counseling  Complete   Palliative Care Screening  Complete   Medication Review [employer])  Complete Complete  PCP or Specialist appointment within 3-5 days of discharge   Complete  SW Recovery Care/Counseling Consult Complete  Complete  Palliative Care Screening Complete  Not Applicable  Skilled Nursing Facility   Not Applicable

## 2023-10-17 NOTE — Plan of Care (Signed)

## 2023-10-17 NOTE — Progress Notes (Addendum)
 PROGRESS NOTE    Todd Brown  ONG:[id number] DOB: 09-Jul-1960 DOA: 10/13/2023 PCP: Janann Meadow, NP  No chief complaint on file.   Hospital Course:  Todd Brown is a 63 year old male with ESRD-HD MWF, schizophrenia, hypertension, hyperlipidemia, COPD with chronic hypoxic respiratory failure on 2 L, CAD, recurrent ascites requiring prior paracentesis, hypothyroidism, GERD, remote right leg DVT 2018 not on AC, prior cardiac arrest, prior strep bacteremia with negative TEE, blindness, who presents on this admission from his group home complaining of shortness of breath, nausea, vomiting, abdominal pain and distention.  Patient missed his dialysis on 5/16 due to abdominal pain.  In the ED he was found to have increasing O2 requirement, to 4 L.  Labs consistent with ESRD.  Blood pressure 89/63, heart rate 74, respiratory rate 25.  Chest x-ray revealed right lung opacity and left lower lobe infiltrates.  CT chest confirms interval increase in masslike opacity in right lung most concerning for malignancy as well as new heterogenous opacities in the right lower lobe which could represent postobstructive pneumonia.  There is also an interval increase in the left lung lower lobe nodule and bilateral pleural effusions.  Patient was admitted for further workup.  Oncology was consulted.  Subjective: No acute events overnight.  Patient is eating independently on arrival.  He has no acute complaints.  Objective: Vitals:   10/16/23 0735 10/16/23 1952 10/17/23 0349 10/17/23 0804  BP: 120/77 114/86 (!) 139/103 (!) 146/97  Pulse: 67 81 76 88  Resp: 15 20 16 18   Temp: 98.6 F (37 C) 98.3 F (36.8 C) 98.2 F (36.8 C) 98.5 F (36.9 C)  TempSrc: Oral   Oral  SpO2: 100% 96% 97% 91%  Weight:   79.7 kg   Height:        Intake/Output Summary (Last 24 hours) at 10/17/2023 1443 Last data filed at 10/16/2023 1632 Gross per 24 hour  Intake 54.83 ml  Output --  Net 54.83 ml   Filed Weights    10/15/23 1735 10/16/23 0500 10/17/23 0349  Weight: 81.1 kg 81.6 kg 79.7 kg    Examination: General exam: Appears calm and comfortable, NAD  Respiratory system: No work of breathing, symmetric chest wall expansion Cardiovascular system: S1 & S2 heard, RRR.  Gastrointestinal system: Abdomen is distended, nontender. Neuro: Alert, speech unintelligible at times, answer questions consistently.  He requires prompting. Extremities: Symmetric, expected ROM Skin: No rashes, lesions Psychiatry: Difficult to assess.  Calm.  Assessment & Plan:  Principal Problem:   Postobstructive pneumonia Active Problems:   Community acquired pneumonia   Lung mass   COPD (chronic obstructive pulmonary disease) (HCC)   Abdominal pain   Ascites   ESRD on hemodialysis (HCC)   HLD (hyperlipidemia)   Essential hypertension   CAD (coronary artery disease)   Hypothyroidism   Schizophrenia (HCC)   Blindness of both eyes   Dyspnea    Postobstructive pneumonia Acute on chronic hypoxic respiratory failure - At baseline patient requires 2 L O2, was tachypneic on arrival requiring 4 L. - Now back to his respiratory baseline - Remains without fever or leukocytosis - Obstruction secondary to mass, likely malignancy.  No hilar lymphadenopathy - Antibiotics have been de-escalated to Augmentin  alone.  Patient continues to tolerate this well - Encourage incentive spirometry though limited by patient's abilities - Symptomatic support with Mucinex  and bronchodilators as needed - Blood cultures remain negative - Respiratory viral panel negative  Lung mass - Seen on CT with interval increase in  size, 6.8 x 9.5 cm extending across the right major fissure into the posterior segment of the right upper lung - Oncology has been consulted.  Suspect that this is malignancy and recommends palliative care - Patient is not a candidate for aggressive chemotherapy or surgical resection - Palliative care was consulted, TOC  working on outpatient arrangements - Have had extensive discussion with the patient's legal guardian, Candice, and she reports they do not plan to pursue aggressive therapy.  She is interested in palliative care outpatient.  She is not interested in hospice at this time as they do not intend to discontinue hemodialysis  Bilateral pleural effusions - Small.  Seen on CT on arrival - Likely improved with hemodialysis - At baseline respiratory status currently  COPD, not currently in exacerbation - Continue with bronchodilators and Mucinex  as above  Abdominal ascites - Patient with abdominal distention and pain.  CT abdomen pelvis with large volume ascites and subcutaneous edema consistent with anasarca - Have requested for paracentesis, received albumin  5/15.  Consistently holding a MAP above 65 - Legal guardian consented to paracentesis - Unable to be performed this weekend due to limited staff support.  Anticipate this will happen tomorrow.  History of hepatitis C. - Chronic, unclear treatment.  Has required paracentesis in the past - LFTs within normal limits.  Ammonia unremarkable - Does have significant ascites. - Paracentesis ordered as above, anticipate this will occur 5/19  ESRD on HD MWF - Nephrology consulted for dialysis during this admission - Has working AV fistula - Continue close monitoring of electrolytes.  Schizophrenia - Resume home meds  Hypertension - Pressure at goal.  Continue current meds  Hyperlipidemia - Statin  Hypothyroidism - Resume home dose levothyroxine   CAD - Stable.  Resume home meds  GERD - PPI  Blindness  Resides at a group home - Legal guardian to visit and discuss prognosis with patient  Adrenal adenoma - 2.3 cm suspected left adrenal adenoma seen on CT.  No follow-up imaging recommended  Poor prognosis - Had extensive discussion with patient's legal guardian, Candice, 5/17.  At this time his legal guardians are clear that they  will not be pursuing aggressive intervention, or cancer workup.  They plan to discharge the patient with palliative care services.  Patient has good quality of life at his assisted living facility, and they would like to continue with these arrangements and continue with hemodialysis.  We did discuss that untreated cancer is likely to spread and patient likely has future hospitalizations ahead.  She endorses understanding.  Will revisit conversations of hospice if patient's health declines on future visits.  For now, TOC has been consulted to assist in arrangements back to ALF after patient has received paracentesis.  She is also working to arrange outpatient palliative care services    DVT prophylaxis: Heparin    Code Status: Limited: Do not attempt resuscitation (DNR) -DNR-LIMITED -Do Not Intubate/DNI  Disposition: Inpatient pending paracentesis 5/19.  Will eventually discharge back to ALF with palliative care services  consultants: Nephrology    Procedures:    Antimicrobials:  Anti-infectives (From admission, onward)    Start     Dose/Rate Route Frequency Ordered Stop   10/17/23 1000  amoxicillin -clavulanate (AUGMENTIN ) 500-125 MG per tablet 1 tablet        1 tablet Oral Daily 10/16/23 1445 10/22/23 0959   10/16/23 0000  amoxicillin -clavulanate (AUGMENTIN ) 500-125 MG tablet        1 tablet Oral Daily 10/16/23 1302 10/23/23 2359  10/13/23 2345  vancomycin  (VANCOREADY) IVPB 1500 mg/300 mL        1,500 mg 150 mL/hr over 120 Minutes Intravenous  Once 10/13/23 2256 10/14/23 0914   10/13/23 2100  piperacillin -tazobactam (ZOSYN ) IVPB 2.25 g  Status:  Discontinued        2.25 g 100 mL/hr over 30 Minutes Intravenous Every 8 hours 10/13/23 2024 10/16/23 1445   10/13/23 2030  vancomycin  (VANCOREADY) IVPB 1500 mg/300 mL  Status:  Discontinued        1,500 mg 150 mL/hr over 120 Minutes Intravenous  Once 10/13/23 2024 10/13/23 2256   10/13/23 2024  vancomycin  variable dose per unstable renal  function (pharmacist dosing)  Status:  Discontinued         Does not apply See admin instructions 10/13/23 2024 10/14/23 0959   10/13/23 1830  cefTRIAXone  (ROCEPHIN ) 1 g in sodium chloride  0.9 % 100 mL IVPB  Status:  Discontinued        1 g 200 mL/hr over 30 Minutes Intravenous  Once 10/13/23 1828 10/13/23 2024   10/13/23 1830  azithromycin  (ZITHROMAX ) 500 mg in sodium chloride  0.9 % 250 mL IVPB  Status:  Discontinued        500 mg 250 mL/hr over 60 Minutes Intravenous  Once 10/13/23 1828 10/13/23 2024       Data Reviewed: I have personally reviewed following labs and imaging studies CBC: Recent Labs  Lab 10/13/23 1620 10/14/23 0610 10/15/23 0411 10/16/23 0439 10/17/23 0418  WBC 6.3 6.1 4.7 5.3 5.9  NEUTROABS  --   --  3.1 3.9 4.1  HGB 13.9 12.9* 13.4 13.5 13.7  HCT 41.9 37.1* 39.5 41.4 41.2  MCV 102.2* 98.9 102.1* 103.5* 100.7*  PLT 223 204 144* 182 237   Basic Metabolic Panel: Recent Labs  Lab 10/13/23 1620 10/14/23 0610 10/15/23 0411 10/16/23 0439 10/17/23 0418  NA 135 135 135 134* 136  K 4.0 4.0 4.4 4.3 4.7  CL 95* 95* 96* 97* 95*  CO2 26 26 22 24 26   GLUCOSE 97 85 76 71 77  BUN 39* 45* 31* 28* 38*  CREATININE 7.61* 8.26* 6.47* 5.21* 6.29*  CALCIUM  9.3 8.8* 9.0 8.8* 9.3  MG  --   --  2.4 2.1 2.3  PHOS  --   --  3.1 3.3 3.9   GFR: Estimated Creatinine Clearance: 13.6 mL/min (A) (by C-G formula based on SCr of 6.29 mg/dL (H)). Liver Function Tests: Recent Labs  Lab 10/13/23 1620 10/15/23 0411 10/16/23 0439 10/17/23 0418  AST 27 23 23 23   ALT 9 9 14 13   ALKPHOS 51 44 45 49  BILITOT 0.9 0.9 0.9 1.0  PROT 7.1 6.6 6.5 7.1  ALBUMIN  3.2* 3.0* 2.9* 3.1*   CBG: Recent Labs  Lab 10/15/23 1836  GLUCAP 99    Recent Results (from the past 240 hours)  Blood culture (routine x 2)     Status: None (Preliminary result)   Collection Time: 10/13/23  9:14 PM   Specimen: BLOOD  Result Value Ref Range Status   Specimen Description BLOOD BLOOD RIGHT ARM  Final    Special Requests   Final    BOTTLES DRAWN AEROBIC AND ANAEROBIC Blood Culture adequate volume   Culture   Final    NO GROWTH 4 DAYS Performed at [facility], [address] [place], [place] [address]    Report Status PENDING  Incomplete  Blood culture (routine x 2)     Status: None (Preliminary result)  Collection Time: 10/13/23 10:50 PM   Specimen: BLOOD  Result Value Ref Range Status   Specimen Description BLOOD BLOOD RIGHT ARM  Final   Special Requests   Final    BOTTLES DRAWN AEROBIC ONLY Blood Culture results may not be optimal due to an inadequate volume of blood received in culture bottles   Culture   Final    NO GROWTH 4 DAYS Performed at [facility], [address] [place], [place] [address]    Report Status PENDING  Incomplete  MRSA Next Gen by PCR, Nasal     Status: None   Collection Time: 10/13/23 11:09 PM   Specimen: Nasal Mucosa; Nasal Swab  Result Value Ref Range Status   MRSA by PCR Next Gen NOT DETECTED NOT DETECTED Final    Comment: (NOTE) The GeneXpert MRSA Assay (FDA approved for NASAL specimens only), is one component of a comprehensive MRSA colonization surveillance program. It is not intended to diagnose MRSA infection nor to guide or monitor treatment for MRSA infections. Test performance is not FDA approved in patients less than 35 years old. Performed at [facility], [address] [place], [place] [address]   Resp panel by RT-PCR (RSV, Flu A&B, Covid)     Status: None   Collection Time: 10/14/23  2:41 AM  Result Value Ref Range Status   SARS Coronavirus 2 by RT PCR NEGATIVE NEGATIVE Final    Comment: (NOTE) SARS-CoV-2 target nucleic acids are NOT DETECTED.  The SARS-CoV-2 RNA is generally detectable in upper respiratory specimens during the acute phase of infection. The lowest concentration of SARS-CoV-2 viral copies this assay can detect is 138 copies/mL. A negative result does not preclude  SARS-Cov-2 infection and should not be used as the sole basis for treatment or other patient management decisions. A negative result may occur with  improper specimen collection/handling, submission of specimen other than nasopharyngeal swab, presence of viral mutation(s) within the areas targeted by this assay, and inadequate number of viral copies(<138 copies/mL). A negative result must be combined with clinical observations, patient history, and epidemiological information. The expected result is Negative.  Fact Sheet for Patients:  BloggerCourse.com  Fact Sheet for Healthcare Providers:  SeriousBroker.it  This test is no t yet approved or cleared by the [place]  FDA and  has been authorized for detection and/or diagnosis of SARS-CoV-2 by FDA under an Emergency Use Authorization (EUA). This EUA will remain  in effect (meaning this test can be used) for the duration of the COVID-19 declaration under Section 564(b)(1) of the Act, 21 U.S.C.section 360bbb-3(b)(1), unless the authorization is terminated  or revoked sooner.       Influenza A by PCR NEGATIVE NEGATIVE Final   Influenza B by PCR NEGATIVE NEGATIVE Final    Comment: (NOTE) The Xpert Xpress SARS-CoV-2/FLU/RSV plus assay is intended as an aid in the diagnosis of influenza from Nasopharyngeal swab specimens and should not be used as a sole basis for treatment. Nasal washings and aspirates are unacceptable for Xpert Xpress SARS-CoV-2/FLU/RSV testing.  Fact Sheet for Patients: BloggerCourse.com  Fact Sheet for Healthcare Providers: SeriousBroker.it  This test is not yet approved or cleared by the [place]  FDA and has been authorized for detection and/or diagnosis of SARS-CoV-2 by FDA under an Emergency Use Authorization (EUA). This EUA will remain in effect (meaning this test can be used) for the duration of  the COVID-19 declaration under Section 564(b)(1) of the Act, 21 U.S.C. section 360bbb-3(b)(1), unless the authorization is  terminated or revoked.     Resp Syncytial Virus by PCR NEGATIVE NEGATIVE Final    Comment: (NOTE) Fact Sheet for Patients: BloggerCourse.com  Fact Sheet for Healthcare Providers: SeriousBroker.it  This test is not yet approved or cleared by the [place]  FDA and has been authorized for detection and/or diagnosis of SARS-CoV-2 by FDA under an Emergency Use Authorization (EUA). This EUA will remain in effect (meaning this test can be used) for the duration of the COVID-19 declaration under Section 564(b)(1) of the Act, 21 U.S.C. section 360bbb-3(b)(1), unless the authorization is terminated or revoked.  Performed at [facility], [address], [place], [place] [address]      Radiology Studies: No results found.   Scheduled Meds:  amoxicillin -clavulanate  1 tablet Oral Daily   ascorbic acid   500 mg Oral Daily   carbamazepine   1,000 mg Oral q morning   Chlorhexidine  Gluconate Cloth  6 each Topical Q0600   cholecalciferol   1,000 Units Oral Daily   feeding supplement  237 mL Oral BID BM   ferrous sulfate   325 mg Oral Daily   fluticasone  furoate-vilanterol  1 puff Inhalation Daily   heparin   5,000 Units Subcutaneous Q8H   levothyroxine   125 mcg Oral Q0600   multivitamin  1 tablet Oral QHS   pantoprazole   40 mg Oral Daily   rosuvastatin   10 mg Oral QHS   trihexyphenidyl   2 mg Oral BID   zinc  sulfate (50mg  elemental zinc )  220 mg Oral Daily   Continuous Infusions:     LOS: 4 days  MDM: Patient is high risk for one or more organ failure.  They necessitate ongoing hospitalization for continued IV therapies and subsequent lab monitoring. Total time spent interpreting labs and vitals, reviewing the medical record, coordinating care amongst consultants and care team members, directly assessing  and discussing care with the patient and/or family:25 min  Sanaai Doane, DO [facility]  To contact the attending physician between 7A-7P please use Epic Chat. To contact the covering physician during after hours 7P-7A, please review Amion.  10/17/2023, 2:43 PM   *This document has been created with the assistance of dictation software. Please excuse typographical errors. *

## 2023-10-18 ENCOUNTER — Inpatient Hospital Stay: Payer: MEDICAID

## 2023-10-18 DIAGNOSIS — J189 Pneumonia, unspecified organism: Secondary | ICD-10-CM | POA: Diagnosis not present

## 2023-10-18 LAB — BODY FLUID CELL COUNT WITH DIFFERENTIAL
Eos, Fluid: 0 %
Lymphs, Fluid: 40 %
Monocyte-Macrophage-Serous Fluid: 52 %
Neutrophil Count, Fluid: 8 %
Total Nucleated Cell Count, Fluid: 214 uL

## 2023-10-18 LAB — CBC WITH DIFFERENTIAL/PLATELET
Abs Immature Granulocytes: 0.04 10*3/uL (ref 0.00–0.07)
Basophils Absolute: 0 10*3/uL (ref 0.0–0.1)
Basophils Relative: 0 %
Eosinophils Absolute: 0.1 10*3/uL (ref 0.0–0.5)
Eosinophils Relative: 2 %
HCT: 36.9 % — ABNORMAL LOW (ref 39.0–52.0)
Hemoglobin: 12.4 g/dL — ABNORMAL LOW (ref 13.0–17.0)
Immature Granulocytes: 1 %
Lymphocytes Relative: 7 %
Lymphs Abs: 0.4 10*3/uL — ABNORMAL LOW (ref 0.7–4.0)
MCH: 33.4 pg (ref 26.0–34.0)
MCHC: 33.6 g/dL (ref 30.0–36.0)
MCV: 99.5 fL (ref 80.0–100.0)
Monocytes Absolute: 0.5 10*3/uL (ref 0.1–1.0)
Monocytes Relative: 9 %
Neutro Abs: 4.4 10*3/uL (ref 1.7–7.7)
Neutrophils Relative %: 81 %
Platelets: 227 10*3/uL (ref 150–400)
RBC: 3.71 MIL/uL — ABNORMAL LOW (ref 4.22–5.81)
RDW: 13.2 % (ref 11.5–15.5)
WBC: 5.4 10*3/uL (ref 4.0–10.5)
nRBC: 0 % (ref 0.0–0.2)

## 2023-10-18 LAB — CULTURE, BLOOD (ROUTINE X 2)
Culture: NO GROWTH
Culture: NO GROWTH
Special Requests: ADEQUATE

## 2023-10-18 LAB — LACTATE DEHYDROGENASE, PLEURAL OR PERITONEAL FLUID: LD, Fluid: 72 U/L — ABNORMAL HIGH (ref 3–23)

## 2023-10-18 LAB — MAGNESIUM: Magnesium: 2.2 mg/dL (ref 1.7–2.4)

## 2023-10-18 LAB — COMPREHENSIVE METABOLIC PANEL WITH GFR
ALT: 14 U/L (ref 0–44)
AST: 20 U/L (ref 15–41)
Albumin: 2.7 g/dL — ABNORMAL LOW (ref 3.5–5.0)
Alkaline Phosphatase: 44 U/L (ref 38–126)
Anion gap: 11 (ref 5–15)
BUN: 51 mg/dL — ABNORMAL HIGH (ref 8–23)
CO2: 27 mmol/L (ref 22–32)
Calcium: 9.1 mg/dL (ref 8.9–10.3)
Chloride: 97 mmol/L — ABNORMAL LOW (ref 98–111)
Creatinine, Ser: 7.27 mg/dL — ABNORMAL HIGH (ref 0.61–1.24)
GFR, Estimated: 8 mL/min — ABNORMAL LOW (ref >60)
Glucose, Bld: 81 mg/dL (ref 70–99)
Potassium: 5.3 mmol/L — ABNORMAL HIGH (ref 3.5–5.1)
Sodium: 135 mmol/L (ref 135–145)
Total Bilirubin: 0.9 mg/dL (ref 0.0–1.2)
Total Protein: 6.5 g/dL (ref 6.5–8.1)

## 2023-10-18 LAB — GLUCOSE, PLEURAL OR PERITONEAL FLUID: Glucose, Fluid: 98 mg/dL

## 2023-10-18 LAB — ALBUMIN, PLEURAL OR PERITONEAL FLUID: Albumin, Fluid: 2.1 g/dL

## 2023-10-18 LAB — PHOSPHORUS: Phosphorus: 4.7 mg/dL — ABNORMAL HIGH (ref 2.5–4.6)

## 2023-10-18 LAB — PROTEIN, PLEURAL OR PERITONEAL FLUID: Total protein, fluid: 4.3 g/dL

## 2023-10-18 MED ORDER — PENTAFLUOROPROP-TETRAFLUOROETH EX AERO: 1.0000 | INHALATION_SPRAY | CUTANEOUS | Status: DC | PRN

## 2023-10-18 MED ORDER — LIDOCAINE HCL (PF) 1 % IJ SOLN
3.0000 mL | Freq: Once | INTRAMUSCULAR | Status: AC
  Administered 2023-10-18: 3 mL via INTRADERMAL

## 2023-10-18 MED ORDER — HEPARIN SODIUM (PORCINE) 1000 UNIT/ML DIALYSIS: 1000.0000 [IU] | INTRAMUSCULAR | Status: DC | PRN

## 2023-10-18 MED ORDER — LIDOCAINE-PRILOCAINE 2.5-2.5 % EX CREA: 1.0000 | TOPICAL_CREAM | CUTANEOUS | Status: DC | PRN

## 2023-10-18 MED ORDER — IPRATROPIUM-ALBUTEROL 0.5-2.5 (3) MG/3ML IN SOLN
3.0000 mL | Freq: Four times a day (QID) | RESPIRATORY_TRACT | Status: DC
  Administered 2023-10-18 – 2023-10-19 (×4): 3 mL via RESPIRATORY_TRACT
  Filled 2023-10-18 (×6): qty 3

## 2023-10-18 NOTE — Progress Notes (Addendum)
 PROGRESS NOTE    Todd Brown  GMW:[id number] DOB: [birth year] DOA: 10/13/2023 PCP: Janann Meadow, NP  No chief complaint on file.   Hospital Course:  Todd Brown is a 63 year old male with ESRD-HD MWF, schizophrenia, hypertension, hyperlipidemia, COPD with chronic hypoxic respiratory failure on 2 L, CAD, recurrent ascites requiring prior paracentesis, hypothyroidism, GERD, remote right leg DVT 2018 not on AC, prior cardiac arrest, prior strep bacteremia with negative TEE, blindness, who presents on this admission from his group home complaining of shortness of breath, nausea, vomiting, abdominal pain and distention.  Patient missed his dialysis on 5/16 due to abdominal pain.  In the ED he was found to have increasing O2 requirement, to 4 L.  Labs consistent with ESRD.  Blood pressure 89/63, heart rate 74, respiratory rate 25.  Chest x-ray revealed right lung opacity and left lower lobe infiltrates.  CT chest confirms interval increase in masslike opacity in right lung most concerning for malignancy as well as new heterogenous opacities in the right lower lobe which could represent postobstructive pneumonia.  There is also an interval increase in the left lung lower lobe nodule and bilateral pleural effusions.  Patient was admitted for further workup.  Oncology was consulted.  Subjective: No acute events overnight.  Patient is waiting for paracentesis today  Objective: Vitals:   10/18/23 1530 10/18/23 1600 10/18/23 1630 10/18/23 1700  BP: 107/69 102/69 (!) 153/81 125/82  Pulse: 71 70 77 74  Resp: 15 15 20 20   Temp:      TempSrc:      SpO2: 99% 99% 99% 99%  Weight:      Height:       No intake or output data in the 24 hours ending 10/18/23 1718  Filed Weights   10/17/23 0349 10/18/23 0300 10/18/23 1519  Weight: 79.7 kg 82.4 kg 76.5 kg    Examination: General exam: Appears calm and comfortable, NAD  Respiratory system: No work of breathing, symmetric chest wall  expansion Cardiovascular system: S1 & S2 heard, RRR.  Gastrointestinal system: Abdomen is distended, nontender. Neuro: Alert, speech unintelligible at times, answer questions consistently.  He requires prompting. Extremities: Symmetric, expected ROM Skin: No rashes, lesions Psychiatry: Difficult to assess.  Calm.  Assessment & Plan:  Principal Problem:   Postobstructive pneumonia Active Problems:   Community acquired pneumonia   Lung mass   COPD (chronic obstructive pulmonary disease) (HCC)   Abdominal pain   Ascites   ESRD on hemodialysis (HCC)   HLD (hyperlipidemia)   Essential hypertension   CAD (coronary artery disease)   Hypothyroidism   Schizophrenia (HCC)   Blindness of both eyes   Dyspnea    Postobstructive pneumonia Acute on chronic hypoxic respiratory failure - At baseline patient requires 2 L O2, was tachypneic on arrival requiring 4 L. - Now back to his respiratory baseline - Remains without fever or leukocytosis - Obstruction secondary to mass, likely malignancy.  No hilar lymphadenopathy - Antibiotics de-escalated to Augmentin .  Patient continues to tolerate this -- No WBC.  - Continue symptomatic support as needed - Blood cultures remain negative - Respiratory viral panel negative  Lung mass - Seen on CT with interval increase in size, 6.8 x 9.5 cm extending across the right major fissure into the posterior segment of the right upper lung - Oncology has been consulted.  Suspect that this is malignancy and recommends palliative care - Patient is not a candidate for aggressive chemotherapy or surgical resection - Palliative care was  consulted, TOC working on outpatient arrangements - Have had extensive discussion with the patient's legal guardian, Candice, and she reports they do not plan to pursue aggressive therapy.  She is interested in palliative care outpatient.  She is not interested in hospice at this time as they do not intend to discontinue  hemodialysis  Bilateral pleural effusions - Small.  Seen on CT on arrival - Likely improved with hemodialysis - At baseline respiratory status currently  COPD, not currently in exacerbation - Continue with bronchodilators and Mucinex  as above  Abdominal ascites - Patient with abdominal distention and pain.  CT abdomen pelvis with large volume ascites and subcutaneous edema consistent with anasarca - Paracentesis today.  Hyperkalemia - To be corrected with dialysis today.  History of hepatitis C. - Chronic, unclear treatment.  Has required paracentesis in the past - LFTs within normal limits.  Ammonia unremarkable - Does have significant ascites.  Paracentesis today.  ESRD on HD MWF - Nephrology consulted for dialysis during this admission - Has working AV fistula - Dialysis per nephro.  Continue close monitoring electrolytes  Schizophrenia - Resume home meds  Hypertension - Pressure at goal.  Continue current meds  Hyperlipidemia - Statin  Hypothyroidism - Resume home dose levothyroxine   CAD - Stable.  Resume home meds  GERD - PPI  Blindness  Resides at a group home - Legal guardian to visit and discuss prognosis with patient  Adrenal adenoma - 2.3 cm suspected left adrenal adenoma seen on CT.  No follow-up imaging recommended  Poor prognosis - Had extensive discussion with patient's legal guardian, Candice, 5/17.  At this time his legal guardians are clear that they will not be pursuing aggressive intervention, or cancer workup.  They plan to discharge the patient with palliative care services.  Patient has good quality of life at his assisted living facility, and they would like to continue with these arrangements and continue with hemodialysis.  We did discuss that untreated cancer is likely to spread and patient likely has future hospitalizations ahead.  She endorses understanding.  Will revisit conversations of hospice if patient's health declines on future  visits.  For now, TOC has been consulted to assist in arrangements back to ALF after patient has received paracentesis.  She is also working to arrange outpatient palliative care services    DVT prophylaxis: Heparin    Code Status: Limited: Do not attempt resuscitation (DNR) -DNR-LIMITED -Do Not Intubate/DNI  Disposition: Medically stable for discharge today.  Receiving hemodialysis and paracentesis which has delayed discharge.  Will likely go home tomorrow morning. consultants: Nephrology    Procedures:    Antimicrobials:  Anti-infectives (From admission, onward)    Start     Dose/Rate Route Frequency Ordered Stop   10/17/23 1000  amoxicillin -clavulanate (AUGMENTIN ) 500-125 MG per tablet 1 tablet        1 tablet Oral Daily 10/16/23 1445 10/22/23 0959   10/16/23 0000  amoxicillin -clavulanate (AUGMENTIN ) 500-125 MG tablet        1 tablet Oral Daily 10/16/23 1302 10/23/23 2359   10/13/23 2345  vancomycin  (VANCOREADY) IVPB 1500 mg/300 mL        1,500 mg 150 mL/hr over 120 Minutes Intravenous  Once 10/13/23 2256 10/14/23 0914   10/13/23 2100  piperacillin -tazobactam (ZOSYN ) IVPB 2.25 g  Status:  Discontinued        2.25 g 100 mL/hr over 30 Minutes Intravenous Every 8 hours 10/13/23 2024 10/16/23 1445   10/13/23 2030  vancomycin  (VANCOREADY) IVPB 1500 mg/300 mL  Status:  Discontinued        1,500 mg 150 mL/hr over 120 Minutes Intravenous  Once 10/13/23 2024 10/13/23 2256   10/13/23 2024  vancomycin  variable dose per unstable renal function (pharmacist dosing)  Status:  Discontinued         Does not apply See admin instructions 10/13/23 2024 10/14/23 0959   10/13/23 1830  cefTRIAXone  (ROCEPHIN ) 1 g in sodium chloride  0.9 % 100 mL IVPB  Status:  Discontinued        1 g 200 mL/hr over 30 Minutes Intravenous  Once 10/13/23 1828 10/13/23 2024   10/13/23 1830  azithromycin  (ZITHROMAX ) 500 mg in sodium chloride  0.9 % 250 mL IVPB  Status:  Discontinued        500 mg 250 mL/hr over 60 Minutes  Intravenous  Once 10/13/23 1828 10/13/23 2024       Data Reviewed: I have personally reviewed following labs and imaging studies CBC: Recent Labs  Lab 10/14/23 0610 10/15/23 0411 10/16/23 0439 10/17/23 0418 10/18/23 0442  WBC 6.1 4.7 5.3 5.9 5.4  NEUTROABS  --  3.1 3.9 4.1 4.4  HGB 12.9* 13.4 13.5 13.7 12.4*  HCT 37.1* 39.5 41.4 41.2 36.9*  MCV 98.9 102.1* 103.5* 100.7* 99.5  PLT 204 144* 182 237 227   Basic Metabolic Panel: Recent Labs  Lab 10/14/23 0610 10/15/23 0411 10/16/23 0439 10/17/23 0418 10/18/23 0442  NA 135 135 134* 136 135  K 4.0 4.4 4.3 4.7 5.3*  CL 95* 96* 97* 95* 97*  CO2 26 22 24 26 27   GLUCOSE 85 76 71 77 81  BUN 45* 31* 28* 38* 51*  CREATININE 8.26* 6.47* 5.21* 6.29* 7.27*  CALCIUM  8.8* 9.0 8.8* 9.3 9.1  MG  --  2.4 2.1 2.3 2.2  PHOS  --  3.1 3.3 3.9 4.7*   GFR: Estimated Creatinine Clearance: 11.3 mL/min (A) (by C-G formula based on SCr of 7.27 mg/dL (H)). Liver Function Tests: Recent Labs  Lab 10/13/23 1620 10/15/23 0411 10/16/23 0439 10/17/23 0418 10/18/23 0442  AST 27 23 23 23 20   ALT 9 9 14 13 14   ALKPHOS 51 44 45 49 44  BILITOT 0.9 0.9 0.9 1.0 0.9  PROT 7.1 6.6 6.5 7.1 6.5  ALBUMIN  3.2* 3.0* 2.9* 3.1* 2.7*   CBG: Recent Labs  Lab 10/15/23 1836  GLUCAP 99    Recent Results (from the past 240 hours)  Blood culture (routine x 2)     Status: None   Collection Time: 10/13/23  9:14 PM   Specimen: BLOOD  Result Value Ref Range Status   Specimen Description BLOOD BLOOD RIGHT ARM  Final   Special Requests   Final    BOTTLES DRAWN AEROBIC AND ANAEROBIC Blood Culture adequate volume   Culture   Final    NO GROWTH 5 DAYS Performed at [facility], [address] [place], [place] [address]    Report Status 10/18/2023 FINAL  Final  Blood culture (routine x 2)     Status: None   Collection Time: 10/13/23 10:50 PM   Specimen: BLOOD  Result Value Ref Range Status   Specimen Description BLOOD BLOOD RIGHT ARM  Final    Special Requests   Final    BOTTLES DRAWN AEROBIC ONLY Blood Culture results may not be optimal due to an inadequate volume of blood received in culture bottles   Culture   Final    NO GROWTH 5 DAYS Performed at [facility], [address] [place], [place] [address]    Report Status 10/18/2023 FINAL  Final  MRSA Next Gen by PCR, Nasal     Status: None   Collection Time: 10/13/23 11:09 PM   Specimen: Nasal Mucosa; Nasal Swab  Result Value Ref Range Status   MRSA by PCR Next Gen NOT DETECTED NOT DETECTED Final    Comment: (NOTE) The GeneXpert MRSA Assay (FDA approved for NASAL specimens only), is one component of a comprehensive MRSA colonization surveillance program. It is not intended to diagnose MRSA infection nor to guide or monitor treatment for MRSA infections. Test performance is not FDA approved in patients less than 39 years old. Performed at [facility], [address] [place], [place] [address]   Resp panel by RT-PCR (RSV, Flu A&B, Covid)     Status: None   Collection Time: 10/14/23  2:41 AM  Result Value Ref Range Status   SARS Coronavirus 2 by RT PCR NEGATIVE NEGATIVE Final    Comment: (NOTE) SARS-CoV-2 target nucleic acids are NOT DETECTED.  The SARS-CoV-2 RNA is generally detectable in upper respiratory specimens during the acute phase of infection. The lowest concentration of SARS-CoV-2 viral copies this assay can detect is 138 copies/mL. A negative result does not preclude SARS-Cov-2 infection and should not be used as the sole basis for treatment or other patient management decisions. A negative result may occur with  improper specimen collection/handling, submission of specimen other than nasopharyngeal swab, presence of viral mutation(s) within the areas targeted by this assay, and inadequate number of viral copies(<138 copies/mL). A negative result must be combined with clinical observations, patient history, and  epidemiological information. The expected result is Negative.  Fact Sheet for Patients:  BloggerCourse.com  Fact Sheet for Healthcare Providers:  SeriousBroker.it  This test is no t yet approved or cleared by the [place]  FDA and  has been authorized for detection and/or diagnosis of SARS-CoV-2 by FDA under an Emergency Use Authorization (EUA). This EUA will remain  in effect (meaning this test can be used) for the duration of the COVID-19 declaration under Section 564(b)(1) of the Act, 21 U.S.C.section 360bbb-3(b)(1), unless the authorization is terminated  or revoked sooner.       Influenza A by PCR NEGATIVE NEGATIVE Final   Influenza B by PCR NEGATIVE NEGATIVE Final    Comment: (NOTE) The Xpert Xpress SARS-CoV-2/FLU/RSV plus assay is intended as an aid in the diagnosis of influenza from Nasopharyngeal swab specimens and should not be used as a sole basis for treatment. Nasal washings and aspirates are unacceptable for Xpert Xpress SARS-CoV-2/FLU/RSV testing.  Fact Sheet for Patients: BloggerCourse.com  Fact Sheet for Healthcare Providers: SeriousBroker.it  This test is not yet approved or cleared by the [place]  FDA and has been authorized for detection and/or diagnosis of SARS-CoV-2 by FDA under an Emergency Use Authorization (EUA). This EUA will remain in effect (meaning this test can be used) for the duration of the COVID-19 declaration under Section 564(b)(1) of the Act, 21 U.S.C. section 360bbb-3(b)(1), unless the authorization is terminated or revoked.     Resp Syncytial Virus by PCR NEGATIVE NEGATIVE Final    Comment: (NOTE) Fact Sheet for Patients: BloggerCourse.com  Fact Sheet for Healthcare Providers: SeriousBroker.it  This test is not yet approved or cleared by the [place]  FDA and has been  authorized for detection and/or diagnosis of SARS-CoV-2 by FDA under an Emergency Use Authorization (EUA). This EUA will remain in effect (meaning this test can be used) for  the duration of the COVID-19 declaration under Section 564(b)(1) of the Act, 21 U.S.C. section 360bbb-3(b)(1), unless the authorization is terminated or revoked.  Performed at [facility], [address], [place], [place] [address]      Radiology Studies: US  Paracentesis Result Date: 10/18/2023 INDICATION: 63 year old male with history of end-stage renal disease, with recurrent ascites. Patient has requested for therapeutic paracentesis. EXAM: ULTRASOUND GUIDED THERAPEUTIC PARACENTESIS MEDICATIONS: 7 cc of 1% lidocaine  COMPLICATIONS: None immediate. PROCEDURE: Informed written consent was obtained from the patient after a discussion of the risks, benefits and alternatives to treatment. A timeout was performed prior to the initiation of the procedure. Initial ultrasound scanning demonstrates a large amount of ascites within the right lower abdominal quadrant. The right lower abdomen was prepped and draped in the usual sterile fashion. 1% lidocaine  was used for local anesthesia. Following this, a 19 gauge, 7-cm, Yueh catheter was introduced. An ultrasound image was saved for documentation purposes. The paracentesis was performed. The catheter was removed and a dressing was applied. The patient tolerated the procedure well without immediate post procedural complication. Patient received post-procedure intravenous albumin ; see nursing notes for details. FINDINGS: A total of approximately 6.0 L of clear, amber colored peritoneal fluid was removed. IMPRESSION: Successful ultrasound-guided paracentesis yielding 6.0 liters of peritoneal fluid. Procedure performed by Lambert Pillion, PA-C Carim, PA-C Electronically Signed   By: Myrlene Asper D.O.   On: 10/18/2023 15:33     Scheduled Meds:  amoxicillin -clavulanate  1 tablet  Oral Daily   ascorbic acid   500 mg Oral Daily   carbamazepine   1,000 mg Oral q morning   cholecalciferol   1,000 Units Oral Daily   feeding supplement  237 mL Oral BID BM   ferrous sulfate   325 mg Oral Daily   fluticasone  furoate-vilanterol  1 puff Inhalation Daily   heparin   5,000 Units Subcutaneous Q8H   ipratropium-albuterol   3 mL Nebulization Q6H   levothyroxine   125 mcg Oral Q0600   multivitamin  1 tablet Oral QHS   pantoprazole   40 mg Oral Daily   rosuvastatin   10 mg Oral QHS   trihexyphenidyl   2 mg Oral BID   zinc  sulfate (50mg  elemental zinc )  220 mg Oral Daily   Continuous Infusions:     LOS: 5 days  MDM: Patient is high risk for one or more organ failure.  They necessitate ongoing hospitalization for continued IV therapies and subsequent lab monitoring. Total time spent interpreting labs and vitals, reviewing the medical record, coordinating care amongst consultants and care team members, directly assessing and discussing care with the patient and/or family:25 min  Nardos Putnam, DO [facility]  To contact the attending physician between 7A-7P please use Epic Chat. To contact the covering physician during after hours 7P-7A, please review Amion.  10/18/2023, 5:18 PM   *This document has been created with the assistance of dictation software. Please excuse typographical errors. *

## 2023-10-18 NOTE — Procedures (Signed)
 PROCEDURE SUMMARY:  Successful image-guided paracentesis from the right abdomen.  Yielded 6.0 liters of clear, amber colored peritoneal fluid.  No immediate complications.  EBL: zero Patient tolerated well.    Please see imaging section of Epic for full dictation.  Lovena Rubinstein PA-C 10/18/2023 2:39 PM

## 2023-10-18 NOTE — Progress Notes (Signed)
 Received patient in bed.Alert to self.He is blind.Consent verified.  Access used : Left arm avf that worked well.  Duration of treatment: 3.5 hours.  Uf goal: Met 1.8  liter. Tolerated treatment.  Hand off to the patient's nurse with stable condition via transporter

## 2023-10-18 NOTE — Progress Notes (Signed)
 [facility]  ROUNDING NOTE   Subjective:   Todd Brown is a 63 year old male with past medical conditions including COPD, hypertension, DVT, schizophrenia, and end-stage renal disease on hemodialysis.  Patient presents to the emergency department with cough and shortness of breath.  Patient has been admitted for Postobstructive pneumonia [J18.9]  Patient is known to our practice and receives outpatient dialysis treatments at [facility] on a MWF schedule, supervised by Dr. Rhesa Celeste.   Patient seen sitting up in bed Currently eating breakfast Alert and oriented Dialysis scheduled for later today No shortness of breath   Objective:  Vital signs in last 24 hours:  Temp:  [97.9 F (36.6 C)-98.6 F (37 C)] 97.9 F (36.6 C) (05/19 0832) Pulse Rate:  [69-86] 69 (05/19 0832) Resp:  [16-20] 18 (05/19 0832) BP: (123-160)/(78-100) 156/100 (05/19 0832) SpO2:  [92 %-98 %] 95 % (05/19 0832) Weight:  [82.4 kg] 82.4 kg (05/19 0300)  Weight change: 2.7 kg Filed Weights   10/16/23 0500 10/17/23 0349 10/18/23 0300  Weight: 81.6 kg 79.7 kg 82.4 kg    Intake/Output: No intake/output data recorded.   Intake/Output this shift:  No intake/output data recorded.  Physical Exam: General: NAD  Head: Normocephalic, atraumatic. Moist oral mucosal membranes  Eyes: Anicteric  Lungs:  Clear to auscultation, normal effort, NCO 2  Heart: Regular rate and rhythm  Abdomen:  Soft, nontender, distention  Extremities: Trace peripheral edema.  Neurologic: Alert and oriented, moving all four extremities  Skin: No lesions  Access: Left aVF    Basic Metabolic Panel: Recent Labs  Lab 10/14/23 0610 10/15/23 0411 10/16/23 0439 10/17/23 0418 10/18/23 0442  NA 135 135 134* 136 135  K 4.0 4.4 4.3 4.7 5.3*  CL 95* 96* 97* 95* 97*  CO2 26 22 24 26 27   GLUCOSE 85 76 71 77 81  BUN 45* 31* 28* 38* 51*  CREATININE 8.26* 6.47* 5.21* 6.29* 7.27*  CALCIUM  8.8* 9.0 8.8* 9.3 9.1  MG   --  2.4 2.1 2.3 2.2  PHOS  --  3.1 3.3 3.9 4.7*    Liver Function Tests: Recent Labs  Lab 10/13/23 1620 10/15/23 0411 10/16/23 0439 10/17/23 0418 10/18/23 0442  AST 27 23 23 23 20   ALT 9 9 14 13 14   ALKPHOS 51 44 45 49 44  BILITOT 0.9 0.9 0.9 1.0 0.9  PROT 7.1 6.6 6.5 7.1 6.5  ALBUMIN  3.2* 3.0* 2.9* 3.1* 2.7*   Recent Labs  Lab 10/13/23 1620  LIPASE 26   Recent Labs  Lab 10/13/23 2250  AMMONIA <13    CBC: Recent Labs  Lab 10/14/23 0610 10/15/23 0411 10/16/23 0439 10/17/23 0418 10/18/23 0442  WBC 6.1 4.7 5.3 5.9 5.4  NEUTROABS  --  3.1 3.9 4.1 4.4  HGB 12.9* 13.4 13.5 13.7 12.4*  HCT 37.1* 39.5 41.4 41.2 36.9*  MCV 98.9 102.1* 103.5* 100.7* 99.5  PLT 204 144* 182 237 227    Cardiac Enzymes: No results for input(s): "CKTOTAL", "CKMB", "CKMBINDEX", "TROPONINI" in the last 168 hours.  BNP: Invalid input(s): "POCBNP"  CBG: Recent Labs  Lab 10/15/23 1836  GLUCAP 99    Microbiology: Results for orders placed or performed during the hospital encounter of 10/13/23  Blood culture (routine x 2)     Status: None   Collection Time: 10/13/23  9:14 PM   Specimen: BLOOD  Result Value Ref Range Status   Specimen Description BLOOD BLOOD RIGHT ARM  Final   Special Requests  Final    BOTTLES DRAWN AEROBIC AND ANAEROBIC Blood Culture adequate volume   Culture   Final    NO GROWTH 5 DAYS Performed at [facility], [address] [place], [place] [address]    Report Status 10/18/2023 FINAL  Final  Blood culture (routine x 2)     Status: None   Collection Time: 10/13/23 10:50 PM   Specimen: BLOOD  Result Value Ref Range Status   Specimen Description BLOOD BLOOD RIGHT ARM  Final   Special Requests   Final    BOTTLES DRAWN AEROBIC ONLY Blood Culture results may not be optimal due to an inadequate volume of blood received in culture bottles   Culture   Final    NO GROWTH 5 DAYS Performed at [facility], [address] [place], [place]  [address]    Report Status 10/18/2023 FINAL  Final  MRSA Next Gen by PCR, Nasal     Status: None   Collection Time: 10/13/23 11:09 PM   Specimen: Nasal Mucosa; Nasal Swab  Result Value Ref Range Status   MRSA by PCR Next Gen NOT DETECTED NOT DETECTED Final    Comment: (NOTE) The GeneXpert MRSA Assay (FDA approved for NASAL specimens only), is one component of a comprehensive MRSA colonization surveillance program. It is not intended to diagnose MRSA infection nor to guide or monitor treatment for MRSA infections. Test performance is not FDA approved in patients less than 30 years old. Performed at [facility], [address] [place], [place] [address]   Resp panel by RT-PCR (RSV, Flu A&B, Covid)     Status: None   Collection Time: 10/14/23  2:41 AM  Result Value Ref Range Status   SARS Coronavirus 2 by RT PCR NEGATIVE NEGATIVE Final    Comment: (NOTE) SARS-CoV-2 target nucleic acids are NOT DETECTED.  The SARS-CoV-2 RNA is generally detectable in upper respiratory specimens during the acute phase of infection. The lowest concentration of SARS-CoV-2 viral copies this assay can detect is 138 copies/mL. A negative result does not preclude SARS-Cov-2 infection and should not be used as the sole basis for treatment or other patient management decisions. A negative result may occur with  improper specimen collection/handling, submission of specimen other than nasopharyngeal swab, presence of viral mutation(s) within the areas targeted by this assay, and inadequate number of viral copies(<138 copies/mL). A negative result must be combined with clinical observations, patient history, and epidemiological information. The expected result is Negative.  Fact Sheet for Patients:  BloggerCourse.com  Fact Sheet for Healthcare Providers:  SeriousBroker.it  This test is no t yet approved or cleared by the [place]  FDA and  has  been authorized for detection and/or diagnosis of SARS-CoV-2 by FDA under an Emergency Use Authorization (EUA). This EUA will remain  in effect (meaning this test can be used) for the duration of the COVID-19 declaration under Section 564(b)(1) of the Act, 21 U.S.C.section 360bbb-3(b)(1), unless the authorization is terminated  or revoked sooner.       Influenza A by PCR NEGATIVE NEGATIVE Final   Influenza B by PCR NEGATIVE NEGATIVE Final    Comment: (NOTE) The Xpert Xpress SARS-CoV-2/FLU/RSV plus assay is intended as an aid in the diagnosis of influenza from Nasopharyngeal swab specimens and should not be used as a sole basis for treatment. Nasal washings and aspirates are unacceptable for Xpert Xpress SARS-CoV-2/FLU/RSV testing.  Fact Sheet for Patients: BloggerCourse.com  Fact Sheet for Healthcare Providers: SeriousBroker.it  This test  is not yet approved or cleared by the [place]  FDA and has been authorized for detection and/or diagnosis of SARS-CoV-2 by FDA under an Emergency Use Authorization (EUA). This EUA will remain in effect (meaning this test can be used) for the duration of the COVID-19 declaration under Section 564(b)(1) of the Act, 21 U.S.C. section 360bbb-3(b)(1), unless the authorization is terminated or revoked.     Resp Syncytial Virus by PCR NEGATIVE NEGATIVE Final    Comment: (NOTE) Fact Sheet for Patients: BloggerCourse.com  Fact Sheet for Healthcare Providers: SeriousBroker.it  This test is not yet approved or cleared by the [place]  FDA and has been authorized for detection and/or diagnosis of SARS-CoV-2 by FDA under an Emergency Use Authorization (EUA). This EUA will remain in effect (meaning this test can be used) for the duration of the COVID-19 declaration under Section 564(b)(1) of the Act, 21 U.S.C. section 360bbb-3(b)(1), unless the  authorization is terminated or revoked.  Performed at [facility], [address] [place], [place] [address]     Coagulation Studies: No results for input(s): "LABPROT", "INR" in the last 72 hours.  Urinalysis: No results for input(s): "COLORURINE", "LABSPEC", "PHURINE", "GLUCOSEU", "HGBUR", "BILIRUBINUR", "KETONESUR", "PROTEINUR", "UROBILINOGEN", "NITRITE", "LEUKOCYTESUR" in the last 72 hours.  Invalid input(s): "APPERANCEUR"    Imaging: No results found.    Medications:      amoxicillin -clavulanate  1 tablet Oral Daily   ascorbic acid   500 mg Oral Daily   carbamazepine   1,000 mg Oral q morning   cholecalciferol   1,000 Units Oral Daily   feeding supplement  237 mL Oral BID BM   ferrous sulfate   325 mg Oral Daily   fluticasone  furoate-vilanterol  1 puff Inhalation Daily   heparin   5,000 Units Subcutaneous Q8H   ipratropium-albuterol   3 mL Nebulization Q6H   levothyroxine   125 mcg Oral Q0600   multivitamin  1 tablet Oral QHS   pantoprazole   40 mg Oral Daily   rosuvastatin   10 mg Oral QHS   trihexyphenidyl   2 mg Oral BID   zinc  sulfate (50mg  elemental zinc )  220 mg Oral Daily   acetaminophen , albuterol , guaiFENesin -dextromethorphan , heparin , hydrALAZINE , hyoscyamine , lidocaine -prilocaine , LORazepam , morphine  injection, ondansetron  (ZOFRAN ) IV, pentafluoroprop-tetrafluoroeth, polyethylene glycol, traMADol   Assessment/ Plan:  Todd Brown is a 63 y.o.  male  with past medical conditions including COPD, hypertension, DVT, schizophrenia, and end-stage renal disease on hemodialysis.  Patient presents to the emergency department with cough and shortness of breath.  Patient has been admitted for Postobstructive pneumonia [J18.9]  [facility]/MWF/left aVF  End-stage renal disease on hemodialysis.  Dialysis scheduled for later today.  2.  Acute respiratory failure requiring increased oxygen requirement.  Placed on 4 L nasal cannula on admission..   Chest x-ray suspicious for pneumonia.  Patient remains on 2 L nasal cannula.  Has been transition to oral Augmentin .  3.  Lung mass, unspecified.  CT abdomen pelvis show right lobe lung mass, increased in size since previous imaging.  Concerning for malignancy.  Primary team has consulted oncology.  Due to multiple comorbidities, not a viable candidate for treatment.  Primary team and palliative have contacted legal guardian who wishes to forego any treatments to maintain quality of life.  4. Anemia of chronic kidney disease.  Normocytic Lab Results  Component Value Date   HGB 12.4 (L) 10/18/2023    Hemoglobin remains at goal but due to malignancy, would avoid ESA's.  Patient does receive Mircera at outpatient clinic, will notify of  malignancy.  5. Secondary Hyperparathyroidism: with outpatient labs: none available  Lab Results  Component Value Date   PTH 194 (H) 05/11/2017   CALCIUM  9.1 10/18/2023   CAION 1.19 03/27/2022   PHOS 4.7 (H) 10/18/2023    Will continue to monitor bone minerals.  Continue colecalciferol.  LOS: 5 Daylan Juhnke 5/19/202510:53 AM

## 2023-10-19 DIAGNOSIS — R0600 Dyspnea, unspecified: Secondary | ICD-10-CM | POA: Diagnosis not present

## 2023-10-19 DIAGNOSIS — J189 Pneumonia, unspecified organism: Secondary | ICD-10-CM | POA: Diagnosis not present

## 2023-10-19 DIAGNOSIS — J449 Chronic obstructive pulmonary disease, unspecified: Secondary | ICD-10-CM | POA: Diagnosis not present

## 2023-10-19 DIAGNOSIS — R109 Unspecified abdominal pain: Secondary | ICD-10-CM | POA: Diagnosis not present

## 2023-10-19 LAB — COMPREHENSIVE METABOLIC PANEL WITH GFR
ALT: 17 U/L (ref 0–44)
AST: 30 U/L (ref 15–41)
Albumin: 2.5 g/dL — ABNORMAL LOW (ref 3.5–5.0)
Alkaline Phosphatase: 48 U/L (ref 38–126)
Anion gap: 9 (ref 5–15)
BUN: 35 mg/dL — ABNORMAL HIGH (ref 8–23)
CO2: 27 mmol/L (ref 22–32)
Calcium: 8.6 mg/dL — ABNORMAL LOW (ref 8.9–10.3)
Chloride: 97 mmol/L — ABNORMAL LOW (ref 98–111)
Creatinine, Ser: 5.39 mg/dL — ABNORMAL HIGH (ref 0.61–1.24)
GFR, Estimated: 11 mL/min — ABNORMAL LOW (ref >60)
Glucose, Bld: 90 mg/dL (ref 70–99)
Potassium: 4.3 mmol/L (ref 3.5–5.1)
Sodium: 133 mmol/L — ABNORMAL LOW (ref 135–145)
Total Bilirubin: 0.7 mg/dL (ref 0.0–1.2)
Total Protein: 5.7 g/dL — ABNORMAL LOW (ref 6.5–8.1)

## 2023-10-19 LAB — CBC WITH DIFFERENTIAL/PLATELET
Abs Immature Granulocytes: 0.03 10*3/uL (ref 0.00–0.07)
Basophils Absolute: 0 10*3/uL (ref 0.0–0.1)
Basophils Relative: 0 %
Eosinophils Absolute: 0.1 10*3/uL (ref 0.0–0.5)
Eosinophils Relative: 2 %
HCT: 38.6 % — ABNORMAL LOW (ref 39.0–52.0)
Hemoglobin: 13 g/dL (ref 13.0–17.0)
Immature Granulocytes: 1 %
Lymphocytes Relative: 9 %
Lymphs Abs: 0.6 10*3/uL — ABNORMAL LOW (ref 0.7–4.0)
MCH: 33.2 pg (ref 26.0–34.0)
MCHC: 33.7 g/dL (ref 30.0–36.0)
MCV: 98.5 fL (ref 80.0–100.0)
Monocytes Absolute: 0.7 10*3/uL (ref 0.1–1.0)
Monocytes Relative: 11 %
Neutro Abs: 5.1 10*3/uL (ref 1.7–7.7)
Neutrophils Relative %: 77 %
Platelets: 235 10*3/uL (ref 150–400)
RBC: 3.92 MIL/uL — ABNORMAL LOW (ref 4.22–5.81)
RDW: 13.2 % (ref 11.5–15.5)
WBC: 6.6 10*3/uL (ref 4.0–10.5)
nRBC: 0 % (ref 0.0–0.2)

## 2023-10-19 LAB — MAGNESIUM: Magnesium: 2.1 mg/dL (ref 1.7–2.4)

## 2023-10-19 LAB — PHOSPHORUS: Phosphorus: 3.7 mg/dL (ref 2.5–4.6)

## 2023-10-19 LAB — CYTOLOGY - NON PAP

## 2023-10-19 MED ORDER — IPRATROPIUM-ALBUTEROL 0.5-2.5 (3) MG/3ML IN SOLN: 3.0000 mL | Freq: Two times a day (BID) | RESPIRATORY_TRACT | Status: DC

## 2023-10-19 MED ORDER — AMOXICILLIN-POT CLAVULANATE 500-125 MG PO TABS: 1.0000 | ORAL_TABLET | Freq: Every day | ORAL | 0 refills | Status: AC

## 2023-10-19 NOTE — Progress Notes (Signed)
 [facility]  ROUNDING NOTE   Subjective:   Todd Brown is a 63 year old male with past medical conditions including COPD, hypertension, DVT, schizophrenia, and end-stage renal disease on hemodialysis.  Patient presents to the emergency department with cough and shortness of breath.  Patient has been admitted for Postobstructive pneumonia [J18.9]  Patient is known to our practice and receives outpatient dialysis treatments at [facility] on a MWF schedule, supervised by Dr. Rhesa Celeste.   Patient resting in bed Alert Awaiting assistance with meals.  No complaints Cough present   Objective:  Vital signs in last 24 hours:  Temp:  [97.2 F (36.2 C)-98.8 F (37.1 C)] 97.6 F (36.4 C) (05/20 0753) Pulse Rate:  [69-81] 81 (05/20 0753) Resp:  [15-21] 18 (05/20 0753) BP: (102-174)/(67-101) 144/81 (05/20 0753) SpO2:  [85 %-100 %] 94 % (05/20 0753) Weight:  [74.6 kg-76.5 kg] 74.6 kg (05/20 0500)  Weight change: -5.9 kg Filed Weights   10/18/23 1519 10/18/23 1949 10/19/23 0500  Weight: 76.5 kg 74.7 kg 74.6 kg    Intake/Output: I/O last 3 completed shifts: In: -  Out: 1800 [Other:1800]   Intake/Output this shift:  No intake/output data recorded.  Physical Exam: General: NAD  Head: Normocephalic, atraumatic. Moist oral mucosal membranes  Eyes: Anicteric  Lungs:  Clear to auscultation, normal effort, NCO 2  Heart: Regular rate and rhythm  Abdomen:  Soft, nontender, distention  Extremities: Trace peripheral edema.  Neurologic: Alert and oriented, moving all four extremities  Skin: No lesions  Access: Left aVF    Basic Metabolic Panel: Recent Labs  Lab 10/15/23 0411 10/16/23 0439 10/17/23 0418 10/18/23 0442 10/19/23 0342  NA 135 134* 136 135 133*  K 4.4 4.3 4.7 5.3* 4.3  CL 96* 97* 95* 97* 97*  CO2 22 24 26 27 27   GLUCOSE 76 71 77 81 90  BUN 31* 28* 38* 51* 35*  CREATININE 6.47* 5.21* 6.29* 7.27* 5.39*  CALCIUM  9.0 8.8* 9.3 9.1 8.6*  MG 2.4  2.1 2.3 2.2 2.1  PHOS 3.1 3.3 3.9 4.7* 3.7    Liver Function Tests: Recent Labs  Lab 10/15/23 0411 10/16/23 0439 10/17/23 0418 10/18/23 0442 10/19/23 0342  AST 23 23 23 20 30   ALT 9 14 13 14 17   ALKPHOS 44 45 49 44 48  BILITOT 0.9 0.9 1.0 0.9 0.7  PROT 6.6 6.5 7.1 6.5 5.7*  ALBUMIN  3.0* 2.9* 3.1* 2.7* 2.5*   Recent Labs  Lab 10/13/23 1620  LIPASE 26   Recent Labs  Lab 10/13/23 2250  AMMONIA <13    CBC: Recent Labs  Lab 10/15/23 0411 10/16/23 0439 10/17/23 0418 10/18/23 0442 10/19/23 0342  WBC 4.7 5.3 5.9 5.4 6.6  NEUTROABS 3.1 3.9 4.1 4.4 5.1  HGB 13.4 13.5 13.7 12.4* 13.0  HCT 39.5 41.4 41.2 36.9* 38.6*  MCV 102.1* 103.5* 100.7* 99.5 98.5  PLT 144* 182 237 227 235    Cardiac Enzymes: No results for input(s): "CKTOTAL", "CKMB", "CKMBINDEX", "TROPONINI" in the last 168 hours.  BNP: Invalid input(s): "POCBNP"  CBG: Recent Labs  Lab 10/15/23 1836  GLUCAP 99    Microbiology: Results for orders placed or performed during the hospital encounter of 10/13/23  Blood culture (routine x 2)     Status: None   Collection Time: 10/13/23  9:14 PM   Specimen: BLOOD  Result Value Ref Range Status   Specimen Description BLOOD BLOOD RIGHT ARM  Final   Special Requests   Final    BOTTLES  DRAWN AEROBIC AND ANAEROBIC Blood Culture adequate volume   Culture   Final    NO GROWTH 5 DAYS Performed at [facility], [address] [place], [place] [address]    Report Status 10/18/2023 FINAL  Final  Blood culture (routine x 2)     Status: None   Collection Time: 10/13/23 10:50 PM   Specimen: BLOOD  Result Value Ref Range Status   Specimen Description BLOOD BLOOD RIGHT ARM  Final   Special Requests   Final    BOTTLES DRAWN AEROBIC ONLY Blood Culture results may not be optimal due to an inadequate volume of blood received in culture bottles   Culture   Final    NO GROWTH 5 DAYS Performed at [facility], [address] [place], [place]  [address]    Report Status 10/18/2023 FINAL  Final  MRSA Next Gen by PCR, Nasal     Status: None   Collection Time: 10/13/23 11:09 PM   Specimen: Nasal Mucosa; Nasal Swab  Result Value Ref Range Status   MRSA by PCR Next Gen NOT DETECTED NOT DETECTED Final    Comment: (NOTE) The GeneXpert MRSA Assay (FDA approved for NASAL specimens only), is one component of a comprehensive MRSA colonization surveillance program. It is not intended to diagnose MRSA infection nor to guide or monitor treatment for MRSA infections. Test performance is not FDA approved in patients less than 50 years old. Performed at [facility], [address] [place], [place] [address]   Resp panel by RT-PCR (RSV, Flu A&B, Covid)     Status: None   Collection Time: 10/14/23  2:41 AM  Result Value Ref Range Status   SARS Coronavirus 2 by RT PCR NEGATIVE NEGATIVE Final    Comment: (NOTE) SARS-CoV-2 target nucleic acids are NOT DETECTED.  The SARS-CoV-2 RNA is generally detectable in upper respiratory specimens during the acute phase of infection. The lowest concentration of SARS-CoV-2 viral copies this assay can detect is 138 copies/mL. A negative result does not preclude SARS-Cov-2 infection and should not be used as the sole basis for treatment or other patient management decisions. A negative result may occur with  improper specimen collection/handling, submission of specimen other than nasopharyngeal swab, presence of viral mutation(s) within the areas targeted by this assay, and inadequate number of viral copies(<138 copies/mL). A negative result must be combined with clinical observations, patient history, and epidemiological information. The expected result is Negative.  Fact Sheet for Patients:  BloggerCourse.com  Fact Sheet for Healthcare Providers:  SeriousBroker.it  This test is no t yet approved or cleared by the [place]  FDA and  has  been authorized for detection and/or diagnosis of SARS-CoV-2 by FDA under an Emergency Use Authorization (EUA). This EUA will remain  in effect (meaning this test can be used) for the duration of the COVID-19 declaration under Section 564(b)(1) of the Act, 21 U.S.C.section 360bbb-3(b)(1), unless the authorization is terminated  or revoked sooner.       Influenza A by PCR NEGATIVE NEGATIVE Final   Influenza B by PCR NEGATIVE NEGATIVE Final    Comment: (NOTE) The Xpert Xpress SARS-CoV-2/FLU/RSV plus assay is intended as an aid in the diagnosis of influenza from Nasopharyngeal swab specimens and should not be used as a sole basis for treatment. Nasal washings and aspirates are unacceptable for Xpert Xpress SARS-CoV-2/FLU/RSV testing.  Fact Sheet for Patients: BloggerCourse.com  Fact Sheet for Healthcare Providers: SeriousBroker.it  This test is not yet approved or  cleared by the [place]  FDA and has been authorized for detection and/or diagnosis of SARS-CoV-2 by FDA under an Emergency Use Authorization (EUA). This EUA will remain in effect (meaning this test can be used) for the duration of the COVID-19 declaration under Section 564(b)(1) of the Act, 21 U.S.C. section 360bbb-3(b)(1), unless the authorization is terminated or revoked.     Resp Syncytial Virus by PCR NEGATIVE NEGATIVE Final    Comment: (NOTE) Fact Sheet for Patients: BloggerCourse.com  Fact Sheet for Healthcare Providers: SeriousBroker.it  This test is not yet approved or cleared by the [place]  FDA and has been authorized for detection and/or diagnosis of SARS-CoV-2 by FDA under an Emergency Use Authorization (EUA). This EUA will remain in effect (meaning this test can be used) for the duration of the COVID-19 declaration under Section 564(b)(1) of the Act, 21 U.S.C. section 360bbb-3(b)(1), unless the  authorization is terminated or revoked.  Performed at [facility], [address] [place], [place] [address]   Body fluid culture w Gram Stain     Status: None (Preliminary result)   Collection Time: 10/18/23  1:56 PM   Specimen: PATH Cytology Pleural fluid  Result Value Ref Range Status   Specimen Description   Final    PLEURAL Performed at [facility], [address] [place], [place] [address]    Special Requests   Final    NONE Performed at [facility], [address] [place], [place] [address]    Gram Stain NO WBC SEEN NO ORGANISMS SEEN   Final   Culture   Final    NO GROWTH < 24 HOURS Performed at [facility], [address], [place], [place] [address]    Report Status PENDING  Incomplete    Coagulation Studies: No results for input(s): "LABPROT", "INR" in the last 72 hours.  Urinalysis: No results for input(s): "COLORURINE", "LABSPEC", "PHURINE", "GLUCOSEU", "HGBUR", "BILIRUBINUR", "KETONESUR", "PROTEINUR", "UROBILINOGEN", "NITRITE", "LEUKOCYTESUR" in the last 72 hours.  Invalid input(s): "APPERANCEUR"    Imaging: US  Paracentesis Result Date: 10/18/2023 INDICATION: 63 year old male with history of end-stage renal disease, with recurrent ascites. Patient has requested for therapeutic paracentesis. EXAM: ULTRASOUND GUIDED THERAPEUTIC PARACENTESIS MEDICATIONS: 7 cc of 1% lidocaine  COMPLICATIONS: None immediate. PROCEDURE: Informed written consent was obtained from the patient after a discussion of the risks, benefits and alternatives to treatment. A timeout was performed prior to the initiation of the procedure. Initial ultrasound scanning demonstrates a large amount of ascites within the right lower abdominal quadrant. The right lower abdomen was prepped and draped in the usual sterile fashion. 1% lidocaine  was used for local anesthesia. Following this, a 19 gauge, 7-cm, Yueh catheter was introduced. An ultrasound image was saved  for documentation purposes. The paracentesis was performed. The catheter was removed and a dressing was applied. The patient tolerated the procedure well without immediate post procedural complication. Patient received post-procedure intravenous albumin ; see nursing notes for details. FINDINGS: A total of approximately 6.0 L of clear, amber colored peritoneal fluid was removed. IMPRESSION: Successful ultrasound-guided paracentesis yielding 6.0 liters of peritoneal fluid. Procedure performed by Lambert Pillion, PA-C Carim, PA-C Electronically Signed   By: Myrlene Asper D.O.   On: 10/18/2023 15:33      Medications:      amoxicillin -clavulanate  1 tablet Oral Daily   ascorbic acid   500 mg Oral Daily   carbamazepine   1,000 mg Oral q morning   cholecalciferol   1,000 Units Oral Daily   feeding supplement  237 mL Oral BID BM   ferrous sulfate   325 mg Oral Daily   fluticasone  furoate-vilanterol  1 puff Inhalation Daily   heparin   5,000 Units Subcutaneous Q8H   ipratropium-albuterol   3 mL Nebulization BID   levothyroxine   125 mcg Oral Q0600   multivitamin  1 tablet Oral QHS   pantoprazole   40 mg Oral Daily   rosuvastatin   10 mg Oral QHS   trihexyphenidyl   2 mg Oral BID   zinc  sulfate (50mg  elemental zinc )  220 mg Oral Daily   acetaminophen , albuterol , guaiFENesin -dextromethorphan , hydrALAZINE , hyoscyamine , LORazepam , morphine  injection, ondansetron  (ZOFRAN ) IV, polyethylene glycol, traMADol   Assessment/ Plan:  Todd Brown is a 63 y.o.  male  with past medical conditions including COPD, hypertension, DVT, schizophrenia, and end-stage renal disease on hemodialysis.  Patient presents to the emergency department with cough and shortness of breath.  Patient has been admitted for Postobstructive pneumonia [J18.9]  [facility]/MWF/left aVF  End-stage renal disease on hemodialysis.  Received dialysis yesterday, UF 1.8L achieved. Next treatment scheduled for Wednesday.    2.  Acute  respiratory failure requiring increased oxygen requirement.  Placed on 4 L nasal cannula on admission..  Chest x-ray suspicious for pneumonia.   2 L nasal cannula.  Oral Augmentin .  3.  Lung mass, unspecified.  CT abdomen pelvis show right lobe lung mass, increased in size since previous imaging.  Concerning for malignancy.  Primary team has consulted oncology.  Due to multiple comorbidities, not a viable candidate for treatment.  Primary team and palliative have contacted legal guardian who wishes to forego any treatments to maintain quality of life.  4. Anemia of chronic kidney disease.  Normocytic Lab Results  Component Value Date   HGB 13.0 10/19/2023    Hemoglobin remains at goal but due to malignancy, would avoid ESA's.  Patient does receive Mircera at outpatient clinic, will notify of malignancy.  5. Secondary Hyperparathyroidism: with outpatient labs: none available  Lab Results  Component Value Date   PTH 194 (H) 05/11/2017   CALCIUM  8.6 (L) 10/19/2023   CAION 1.19 03/27/2022   PHOS 3.7 10/19/2023    Bone minerals acceptable. Continue colecalciferol.   LOS: 6 Aline Wesche 5/20/202511:52 AM

## 2023-10-19 NOTE — [place] FL2 (Signed)
 [place]  MEDICAID FL2 LEVEL OF CARE FORM     IDENTIFICATION  Patient Name: Todd Brown Birthdate: 01-May-1961 Sex: male Admission Date (Current Location): 10/13/2023  [facility] and [place] Number:  Chiropodist and Address:         Provider Number: [id number]  Attending Physician Name and Address:  Roise Cleaver, DO  Relative Name and Phone Number:       Current Level of Care: Hospital Recommended Level of Care: Assisted Living Facility Prior Approval Number:    Date Approved/Denied:   PASRR Number:    Discharge Plan: Other (Comment) (alf)    Current Diagnoses: Patient Active Problem List   Diagnosis Date Noted   Dyspnea 10/14/2023   Postobstructive pneumonia 10/13/2023   Lung mass 10/13/2023   HLD (hyperlipidemia) 10/13/2023   Abdominal pain 10/13/2023   Ascites 10/13/2023   Nonspecific chest pain 07/07/2023   Influenza A 06/16/2023   Acute hypoxemic respiratory failure (HCC) 04/30/2023   Blindness of both eyes 04/30/2023   Elevated troponin 04/30/2023   Low back pain 04/30/2023   Productive cough 04/30/2023   Myocardial injury 11/03/2022   CAD (coronary artery disease) 11/03/2022   Hypothyroidism 11/03/2022   Thrombocytopenia (HCC) 11/03/2022   Aspiration pneumonia (HCC) 11/03/2022   Abdominal distension 11/03/2022   HTN (hypertension) 11/03/2022   Pericardial effusion 11/03/2022   Community acquired pneumonia 09/14/2022   Anasarca (mild pulmonary edema, pleural and pericardial effusion, ascites on CT) 09/14/2022   Acute on chronic respiratory failure (HCC) 09/14/2022   Bacteremia 06/20/2022   Acute encephalopathy 06/19/2022   SIRS (systemic inflammatory response syndrome) (HCC) 06/19/2022   NSTEMI (non-ST elevated myocardial infarction) (HCC) 06/19/2022   Acute on chronic respiratory failure with hypoxia (HCC) 05/21/2022   Hypoxia 05/20/2022   COVID-19 virus infection 05/20/2022   Hyponatremia 02/10/2022   Anemia due to chronic  kidney disease 01/21/2022   Hyperkalemia 01/14/2022   Malnutrition of moderate degree 12/24/2021   Oral thrush 12/22/2021   Physical deconditioning 12/20/2021   Acute metabolic encephalopathy 12/15/2021   Essential hypertension 12/15/2021   ESRD on hemodialysis (HCC) 12/15/2021   Altered mental status 10/08/2021   Hypertensive urgency 10/07/2021   COPD (chronic obstructive pulmonary disease) (HCC) 02/26/2021   GERD (gastroesophageal reflux disease) 02/26/2021   Hypertension 11/28/2020   Acute respiratory failure with hypoxia (HCC) 10/23/2019   Anemia of chronic disease 10/23/2019   Fluid overload 10/08/2018   Acute pulmonary edema (HCC) 09/12/2018   Scrotal edema 05/10/2017   Symptomatic anemia 04/30/2017   Anticoagulated on Coumadin  09/14/2016   Cardiac arrest (HCC) 08/03/2016   Severe sepsis versus SIRS 05/26/2016   Dialysis patient (HCC) 04/15/2016   ESRD needing dialysis (HCC) 02/12/2016   Hyperkalemia 05/20/2015   Hepatitis C 09/26/2013   Schizophrenia (HCC) 03/23/2013   Essential hypertension, benign 03/23/2013   Chronic kidney disease 03/23/2013    Orientation RESPIRATION BLADDER Height & Weight     Self, Place  O2 (2L [place])  (anuric) Weight: 74.6 kg Height:  6\' 3"  (190.5 cm)  BEHAVIORAL SYMPTOMS/MOOD NEUROLOGICAL BOWEL NUTRITION STATUS      Continent Diet (dys 3)  AMBULATORY STATUS COMMUNICATION OF NEEDS Skin   Extensive Assist Verbally Normal                       Personal Care Assistance Level of Assistance              Functional Limitations Info  Sight Sight Info: Impaired  SPECIAL CARE FACTORS FREQUENCY                       Contractures Contractures Info: Not present    Additional Factors Info  Code Status, Allergies Code Status Info: DNR Allergies Info: Chlorpromazine           Medication List       STOP taking these medications     calcium  acetate 667 MG capsule Commonly known as: PHOSLO      guaiFENesin -dextromethorphan  100-10 MG/5ML syrup Commonly known as: ROBITUSSIN DM    hydrALAZINE  100 MG tablet Commonly known as: APRESOLINE            TAKE these medications     albuterol  108 (90 Base) MCG/ACT inhaler Commonly known as: VENTOLIN  HFA Inhale 1-2 puffs into the lungs every 4 (four) hours as needed for wheezing or shortness of breath.    amoxicillin -clavulanate 500-125 MG tablet Commonly known as: AUGMENTIN  Take 1 tablet by mouth daily for 3 days. Start taking on: Oct 20, 2023    ascorbic acid  500 MG tablet Commonly known as: VITAMIN C  Take 500 mg by mouth daily.    b complex-vitamin c -folic acid  0.8 MG Tabs tablet Take 1 tablet by mouth at bedtime.    benzonatate  100 MG capsule Commonly known as: TESSALON  Take 100 mg by mouth every 6 (six) hours as needed.    carbamazepine  200 MG tablet Commonly known as: TEGRETOL  Take 1,000 mg by mouth every morning.    cholecalciferol  1000 units tablet Commonly known as: VITAMIN D  Take 1,000 Units by mouth in the morning.    ferrous sulfate  325 (65 FE) MG tablet Take 1 tablet (325 mg total) by mouth daily.    Fluticasone -Salmeterol 250-50 MCG/DOSE Aepb Commonly known as: ADVAIR Inhale 1 puff into the lungs 2 (two) times daily.    hyoscyamine  0.125 MG SL tablet Commonly known as: LEVSIN SL Place 0.125 mg under the tongue every 4 (four) hours as needed for cramping.    ipratropium-albuterol  0.5-2.5 (3) MG/3ML Soln Commonly known as: DUONEB Take 3 mLs by nebulization every 6 (six) hours as needed.    isosorbide  mononitrate 60 MG 24 hr tablet Commonly known as: IMDUR  Take 1 tablet (60 mg total) by mouth daily.    levothyroxine  125 MCG tablet Commonly known as: Synthroid  Take 1 tablet (125 mcg total) by mouth daily.    LORazepam  0.5 MG tablet Commonly known as: ATIVAN  Take 0.5 mg by mouth every 4 (four) hours as needed for anxiety or sleep (agitation, or restlessness).    nicotine  21 mg/24hr  patch Commonly known as: NICODERM CQ  - dosed in mg/24 hours Place 1 patch (21 mg total) onto the skin daily.    omeprazole  40 MG capsule Commonly known as: PRILOSEC Take 40 mg by mouth every morning.    polyethylene glycol 17 g packet Commonly known as: MIRALAX  / GLYCOLAX  Take 17 g by mouth daily as needed for mild constipation.    rosuvastatin  10 MG tablet Commonly known as: CRESTOR  Take 10 mg by mouth at bedtime.    senna-docusate 8.6-50 MG tablet Commonly known as: Senokot-S Take 1 tablet by mouth 2 (two) times daily as needed for mild constipation.    traMADol  50 MG tablet Commonly known as: ULTRAM  Take 50 mg by mouth every 6 (six) hours as needed for moderate pain (pain score 4-6).    trihexyphenidyl  2 MG tablet Commonly known as: ARTANE  Take 2 mg by mouth 2 (two) times daily.  zinc  sulfate (50mg  elemental zinc ) 220 (50 Zn) MG capsule Take 220 mg by mouth daily.    Relevant Imaging Results:  Relevant Lab Results:   Additional Information SS #: [id number]  Loman Risk, RN

## 2023-10-19 NOTE — Discharge Summary (Signed)
 Physician Discharge Summary   Patient: Todd Brown MRN: [id number] DOB: 15-Dec-1960  Admit date:     10/13/2023  Discharge date: 10/19/23  Discharge Physician: Roise Cleaver   PCP: Janann Meadow, NP   Recommendations at discharge:   Continue outpatient hemodialysis as scheduled Outpatient follow-up with palliative care team  Discharge Diagnoses: Principal Problem:   Postobstructive pneumonia Active Problems:   Community acquired pneumonia   Lung mass   COPD (chronic obstructive pulmonary disease) (HCC)   Abdominal pain   Ascites   ESRD on hemodialysis (HCC)   HLD (hyperlipidemia)   Essential hypertension   CAD (coronary artery disease)   Hypothyroidism   Schizophrenia (HCC)   Blindness of both eyes   Dyspnea  Resolved Problems:   * No resolved hospital problems. *  Hospital Course: Todd Brown is a 63 year old male with ESRD-HD MWF, schizophrenia, hypertension, hyperlipidemia, COPD with chronic hypoxic respiratory failure on 2 L, CAD, recurrent ascites requiring prior paracentesis, hypothyroidism, GERD, remote right leg DVT 2018 not on AC, prior cardiac arrest, prior strep bacteremia with negative TEE, blindness, who presents on this admission from his group home complaining of shortness of breath, nausea, vomiting, abdominal pain and distention.  Patient missed his dialysis on 5/16 due to abdominal pain.  In the ED he was found to have increasing O2 requirement, to 4 L.  Labs consistent with ESRD.  Blood pressure 89/63, heart rate 74, respiratory rate 25.  Chest x-ray revealed right lung opacity and left lower lobe infiltrates.  CT chest confirms interval increase in masslike opacity in right lung most concerning for malignancy as well as new heterogenous opacities in the right lower lobe which could represent postobstructive pneumonia.  There is also an interval increase in the left lung lower lobe nodule and bilateral pleural effusions.  Patient was admitted for  further workup.  Oncology was consulted. Patient started on antibiotics and respiratory status improved significantly.  MRSA PCR found to be negative.  Antibiotics de-escalated to renally dosed Augmentin  which he will need to continue at discharge through 5/24. Regarding his lung mass and concern for malignancy, after discussion with oncology it appears that patient is a poor candidate for aggressive cancer treatment.  We involved palliative care. Had extensive discussion with the patient's legal guardian, Sherlene Diss, on 5/17.  We discussed palliative care vs hospice and what these different things would mean for Todd Brown. At this time Todd Brown has profound quality of life, and his legal guardians are not interested in discontinuing hemodyalsis..  They do however agree that they do not want to consider aggressive treatment or intensive therapies for this malignancy.  They would like to continue with his regular hemodialysis   Postobstructive pneumonia Acute on chronic hypoxic respiratory failure - At baseline patient requires 2 L O2, was tachypneic on arrival requiring 4 L. - Now back to his respiratory baseline - Remains without fever or leukocytosis - Obstruction secondary to mass, likely malignancy - Renally dosed Augmentin  through 5/24. - Continue symptomatic support as needed - Blood cultures remain negative - Respiratory viral panel negative   Lung mass - Seen on CT with interval increase in size, 6.8 x 9.5 cm extending across the right major fissure into the posterior segment of the right upper lung - Oncology has been consulted.  Suspect that this is malignancy and recommends palliative care - Patient is not a candidate for aggressive chemotherapy or surgical resection - Palliative care was consulted, TOC working on outpatient arrangements.  Palliative referral sent. - Have had extensive discussion with the patient's legal guardian, Candice, and she reports they do not plan to  pursue aggressive therapy.  She is interested in palliative care outpatient.  She is not interested in hospice at this time as they do not intend to discontinue hemodialysis   Bilateral pleural effusions - Small.  Seen on CT on arrival - Likely improved with hemodialysis - At baseline respiratory status currently   COPD, not currently in exacerbation - Continue with bronchodilators and Mucinex  as above   Abdominal ascites - Patient with abdominal distention and pain.  CT abdomen pelvis with large volume ascites and subcutaneous edema consistent with anasarca - 5/19: 6 L paracentesis with IR   Hyperkalemia - Corrected with dialysis    History of hepatitis C. - Chronic, unclear treatment.  Has required paracentesis in the past - LFTs within normal limits.  Ammonia unremarkable - Does have significant ascites.  Paracentesis 5/19. PRN in the future   ESRD on HD MWF - Nephrology consulted for dialysis during this admission - Has working AV fistula - Dialysis per nephro. - Established with outpatient hemodialysis   Schizophrenia - Resume home meds   Hypertension - Pressure at goal.  Continue current meds   Hyperlipidemia - Statin   Hypothyroidism - Resume home dose levothyroxine    CAD - Stable.  Resume home meds   GERD - PPI   Blindness   Resides at a group home - Legal guardian to visit and discuss prognosis with patient   Adrenal adenoma - 2.3 cm suspected left adrenal adenoma seen on CT.  No follow-up imaging recommended   Poor prognosis - Had extensive discussion with patient's legal guardian, Candice, 5/17.  At this time his legal guardians are clear that they will not be pursuing aggressive intervention, or cancer workup.  They plan to discharge the patient with palliative care services.  Patient has good quality of life at his assisted living facility, and they would like to continue with these arrangements and continue with hemodialysis.  We did discuss that  untreated cancer is likely to spread and patient likely has future hospitalizations ahead.  She endorses understanding.  Will revisit conversations of hospice if patient's health declines on future visits.  For now, TOC has been consulted to assist in arrangements back to ALF after patient has received paracentesis.  She is also working to arrange outpatient palliative care services   Consultants: Oncology, nephrology, interventional radiology, palliative care Procedures performed: Paracentesis 5/19 Disposition: Group Home Diet recommendation:  Discharge Diet Orders (From admission, onward)     Start     Ordered   10/19/23 0000  Diet general        10/19/23 1304           Renal diet DISCHARGE MEDICATION: Allergies as of 10/19/2023       Reactions   Chlorpromazine Other (See Comments)   Reaction:  Unknown , pt states it makes him feel real bad Reaction:  Unknown , pt states it makes him feel real bad        Medication List     STOP taking these medications    calcium  acetate 667 MG capsule Commonly known as: PHOSLO    guaiFENesin -dextromethorphan  100-10 MG/5ML syrup Commonly known as: ROBITUSSIN DM   hydrALAZINE  100 MG tablet Commonly known as: APRESOLINE        TAKE these medications    albuterol  108 (90 Base) MCG/ACT inhaler Commonly known as: VENTOLIN  HFA Inhale 1-2 puffs  into the lungs every 4 (four) hours as needed for wheezing or shortness of breath.   amoxicillin -clavulanate 500-125 MG tablet Commonly known as: AUGMENTIN  Take 1 tablet by mouth daily for 3 days. Start taking on: Oct 20, 2023   ascorbic acid  500 MG tablet Commonly known as: VITAMIN C  Take 500 mg by mouth daily.   b complex-vitamin c -folic acid  0.8 MG Tabs tablet Take 1 tablet by mouth at bedtime.   benzonatate  100 MG capsule Commonly known as: TESSALON  Take 100 mg by mouth every 6 (six) hours as needed.   carbamazepine  200 MG tablet Commonly known as: TEGRETOL  Take 1,000 mg by  mouth every morning.   cholecalciferol  1000 units tablet Commonly known as: VITAMIN D  Take 1,000 Units by mouth in the morning.   ferrous sulfate  325 (65 FE) MG tablet Take 1 tablet (325 mg total) by mouth daily.   Fluticasone -Salmeterol 250-50 MCG/DOSE Aepb Commonly known as: ADVAIR Inhale 1 puff into the lungs 2 (two) times daily.   hyoscyamine  0.125 MG SL tablet Commonly known as: LEVSIN SL Place 0.125 mg under the tongue every 4 (four) hours as needed for cramping.   ipratropium-albuterol  0.5-2.5 (3) MG/3ML Soln Commonly known as: DUONEB Take 3 mLs by nebulization every 6 (six) hours as needed.   isosorbide  mononitrate 60 MG 24 hr tablet Commonly known as: IMDUR  Take 1 tablet (60 mg total) by mouth daily.   levothyroxine  125 MCG tablet Commonly known as: Synthroid  Take 1 tablet (125 mcg total) by mouth daily.   LORazepam  0.5 MG tablet Commonly known as: ATIVAN  Take 0.5 mg by mouth every 4 (four) hours as needed for anxiety or sleep (agitation, or restlessness).   nicotine  21 mg/24hr patch Commonly known as: NICODERM CQ  - dosed in mg/24 hours Place 1 patch (21 mg total) onto the skin daily.   omeprazole  40 MG capsule Commonly known as: PRILOSEC Take 40 mg by mouth every morning.   polyethylene glycol 17 g packet Commonly known as: MIRALAX  / GLYCOLAX  Take 17 g by mouth daily as needed for mild constipation.   rosuvastatin  10 MG tablet Commonly known as: CRESTOR  Take 10 mg by mouth at bedtime.   senna-docusate 8.6-50 MG tablet Commonly known as: Senokot-S Take 1 tablet by mouth 2 (two) times daily as needed for mild constipation.   traMADol  50 MG tablet Commonly known as: ULTRAM  Take 50 mg by mouth every 6 (six) hours as needed for moderate pain (pain score 4-6).   trihexyphenidyl  2 MG tablet Commonly known as: ARTANE  Take 2 mg by mouth 2 (two) times daily.   zinc  sulfate (50mg  elemental zinc ) 220 (50 Zn) MG capsule Take 220 mg by mouth daily.         Discharge Exam: Filed Weights   10/18/23 1519 10/18/23 1949 10/19/23 0500  Weight: 76.5 kg 74.7 kg 74.6 kg   General exam: Appears calm and comfortable, NAD  Respiratory system: No work of breathing, symmetric chest wall expansion Cardiovascular system: S1 & S2 heard, RRR.  Gastrointestinal system: Abdomen is distended, nontender. Neuro: Alert, speech unintelligible at times, answer questions inconsistently. Extremities: Symmetric, expected ROM Skin: No rashes, lesions Psychiatry: calm.  Condition at discharge: stable  The results of significant diagnostics from this hospitalization (including imaging, microbiology, ancillary and laboratory) are listed below for reference.   Imaging Studies: US  Paracentesis Result Date: 10/18/2023 INDICATION: 63 year old male with history of end-stage renal disease, with recurrent ascites. Patient has requested for therapeutic paracentesis. EXAM: ULTRASOUND GUIDED THERAPEUTIC PARACENTESIS MEDICATIONS: 7  cc of 1% lidocaine  COMPLICATIONS: None immediate. PROCEDURE: Informed written consent was obtained from the patient after a discussion of the risks, benefits and alternatives to treatment. A timeout was performed prior to the initiation of the procedure. Initial ultrasound scanning demonstrates a large amount of ascites within the right lower abdominal quadrant. The right lower abdomen was prepped and draped in the usual sterile fashion. 1% lidocaine  was used for local anesthesia. Following this, a 19 gauge, 7-cm, Yueh catheter was introduced. An ultrasound image was saved for documentation purposes. The paracentesis was performed. The catheter was removed and a dressing was applied. The patient tolerated the procedure well without immediate post procedural complication. Patient received post-procedure intravenous albumin ; see nursing notes for details. FINDINGS: A total of approximately 6.0 L of clear, amber colored peritoneal fluid was removed. IMPRESSION:  Successful ultrasound-guided paracentesis yielding 6.0 liters of peritoneal fluid. Procedure performed by Lambert Pillion, PA-C Carim, PA-C Electronically Signed   By: Myrlene Asper D.O.   On: 10/18/2023 15:33   CT ABDOMEN PELVIS WO CONTRAST Result Date: 10/13/2023 CLINICAL DATA:  Nausea vomiting and short of breath EXAM: CT ABDOMEN AND PELVIS WITHOUT CONTRAST TECHNIQUE: Multidetector CT imaging of the abdomen and pelvis was performed following the standard protocol without IV contrast. RADIATION DOSE REDUCTION: This exam was performed according to the departmental dose-optimization program which includes automated exposure control, adjustment of the mA and/or kV according to patient size and/or use of iterative reconstruction technique. COMPARISON:  CT 11/03/2022, chest CT 10/13/2023 FINDINGS: Lower chest: See separately dictated chest CT. Partially visualized mass/masslike consolidation in the right lower lobe. Partial left lower lobe consolidation. Bilateral pleural effusions. Cardiomegaly. Coronary vascular calcification. Small pericardial effusion Hepatobiliary: No calcified gallstone. No focal hepatic abnormality or biliary dilatation Pancreas: Unremarkable. No pancreatic ductal dilatation or surrounding inflammatory changes. Spleen: Normal in size without focal abnormality. Adrenals/Urinary Tract: Enlarged thickened adrenal glands as before. 2.3 cm suspected left adrenal adenoma for which no imaging follow-up is recommended. Atrophic kidneys without hydronephrosis. Bilateral renal cysts for which no imaging follow-up is recommended. Punctate stones in the kidneys. Bladder is decompressed. Stomach/Bowel: Centralization of the bowel. Nondistended stomach. No dilated small bowel. Mild diffuse colon wall thickening versus under distension, chronic. No definite acute inflammatory bowel process. Vascular/Lymphatic: Advanced aortic atherosclerosis. No aneurysm. No suspicious lymph nodes Reproductive: Negative  prostate.  Fluid in the scrotum Other: No free air. Large volume abdominopelvic ascites. Generalized subcutaneous edema consistent with anasarca Musculoskeletal: No acute osseous abnormality IMPRESSION: 1. Negative for bowel obstruction or acute bowel inflammatory process. 2. Large volume abdominopelvic ascites. Generalized subcutaneous edema consistent with anasarca. 3. Cardiomegaly with small pericardial effusion. See separately dictated chest CT for additional findings in the chest 4. Atrophic kidneys with punctate stones. No hydronephrosis. 5. Aortic atherosclerosis. Aortic Atherosclerosis (ICD10-I70.0). Electronically Signed   By: Esmeralda Hedge M.D.   On: 10/13/2023 20:27   CT CHEST WO CONTRAST Result Date: 10/13/2023 CLINICAL DATA:  Abnormal xray - lung opacity/opacities. EXAM: CT CHEST WITHOUT CONTRAST TECHNIQUE: Multidetector CT imaging of the chest was performed following the standard protocol without IV contrast. RADIATION DOSE REDUCTION: This exam was performed according to the departmental dose-optimization program which includes automated exposure control, adjustment of the mA and/or kV according to patient size and/or use of iterative reconstruction technique. COMPARISON:  CT angiography chest from 07/05/2023. FINDINGS: Cardiovascular: Mild cardiomegaly. No pericardial effusion. No aortic aneurysm. There are coronary artery calcifications, in keeping with coronary artery disease. There are also mild peripheral atherosclerotic vascular calcifications  of thoracic aorta and its major branches. There is dilation of the main pulmonary trunk measuring up to 3.8 cm, which is nonspecific but can be seen with pulmonary artery hypertension. Mediastinum/Nodes: Visualized thyroid  gland appears grossly unremarkable. No solid / cystic mediastinal masses. The esophagus is nondistended precluding optimal assessment. There is an enlarged right lower paratracheal lymph node measuring 1.2 x 2.0 cm, which appears  mildly enlarged since the prior study when it measured up to 0.9 x 1.8 cm. No axillary lymphadenopathy by size criteria. Evaluation of bilateral hila is limited due to lack on intravenous contrast: however, no large hilar lymphadenopathy identified. Lungs/Pleura: The central tracheo-bronchial tree is patent. There are diffuse moderate to severe centrilobular emphysematous changes throughout bilateral lungs. Since the prior study, there is interval increase in the masslike opacity in the right lung lower lobe. The lesion measures at least 6.8 x 9.5 cm, orthogonally on axial plane. The opacity extends across the right major fissure into the posterior segment of right upper lobe. There are new heterogeneous opacities in the right lung lower lobe which may represent postobstructive pneumonia. There is small right pleural effusion, which has increased since the prior study. There are multi segmental atelectatic changes at the left lung lower lobe, which also appears increased in size since the prior study. There is small left pleural effusion which is also slightly increased. There is lobulated approximately 1.2 x 1.2 cm nodule in the left lung lower lobe which appear increased in size since the prior study when it measured up to 8 x 13 mm. Upper Abdomen: There is small-to-moderate ascites. Bilateral small/atrophic kidneys noted. There are several hypoattenuating structures in bilateral kidneys, not well evaluated on the current exam. There is small sliding hiatal hernia. Remaining visualized upper abdominal viscera within normal limits. Musculoskeletal: The visualized soft tissues of the chest wall are grossly unremarkable. No suspicious osseous lesions. Probable old healed fracture of the mid sternal body noted. There are mild multilevel degenerative changes in the visualized spine. 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 IMPRESSION: 1. Since the prior  study, there is interval increase in the masslike opacity in the right lung, which is concerning for malignancy. There are new heterogeneous opacities in the right lung lower lobe, which may represent postobstructive pneumonia. Pulmonary consultation and further evaluation with contrast-enhanced chest CT scan is recommended. 2. There is also interval increase in the size of left lung lower lobe nodule. 3. There is interval increase in bilateral pleural effusions. 4. There is interval increase in the size of right lower paratracheal lymph node, which may be reactive versus metastatic. 5. Multiple other nonacute observations, as described above. Aortic Atherosclerosis (ICD10-I70.0) and Emphysema (ICD10-J43.9). Electronically Signed   By: Beula Brunswick M.D.   On: 10/13/2023 18:14   DG Chest Portable 1 View Result Date: 10/13/2023 CLINICAL DATA:  Shortness of breath EXAM: PORTABLE CHEST 1 VIEW COMPARISON:  July 05, 2023 FINDINGS: Parenchymal consolidative opacity of the right lung involving particularly right mid and right lower lobe region correlate with pneumonia. The possibility of underlying mass is not excluded due to the round lobular appearance. And findings correlate with the prior CT chest angio July 05, 2023 that demonstrated a round consolidating process of the right lower lobe. Ill-defined left lower lobe infiltrates and atelectasis. Correlation with CT recommended. Heart and mediastinum within normal limits IMPRESSION: *Parenchymal consolidative opacity of the right lung involving particularly right mid and right lower lobe region correlate  with pneumonia. The possibility of underlying mass is not excluded due to the round lobular appearance. *Ill-defined left lower lobe infiltrates and atelectasis. Correlation with CT recommended. Electronically Signed   By: Fredrich Jefferson M.D.   On: 10/13/2023 16:22    Microbiology: Results for orders placed or performed during the hospital encounter of  10/13/23  Blood culture (routine x 2)     Status: None   Collection Time: 10/13/23  9:14 PM   Specimen: BLOOD  Result Value Ref Range Status   Specimen Description BLOOD BLOOD RIGHT ARM  Final   Special Requests   Final    BOTTLES DRAWN AEROBIC AND ANAEROBIC Blood Culture adequate volume   Culture   Final    NO GROWTH 5 DAYS Performed at [facility], [address] [place], [place] [address]    Report Status 10/18/2023 FINAL  Final  Blood culture (routine x 2)     Status: None   Collection Time: 10/13/23 10:50 PM   Specimen: BLOOD  Result Value Ref Range Status   Specimen Description BLOOD BLOOD RIGHT ARM  Final   Special Requests   Final    BOTTLES DRAWN AEROBIC ONLY Blood Culture results may not be optimal due to an inadequate volume of blood received in culture bottles   Culture   Final    NO GROWTH 5 DAYS Performed at [facility], [address] [place], [place] [address]    Report Status 10/18/2023 FINAL  Final  MRSA Next Gen by PCR, Nasal     Status: None   Collection Time: 10/13/23 11:09 PM   Specimen: Nasal Mucosa; Nasal Swab  Result Value Ref Range Status   MRSA by PCR Next Gen NOT DETECTED NOT DETECTED Final    Comment: (NOTE) The GeneXpert MRSA Assay (FDA approved for NASAL specimens only), is one component of a comprehensive MRSA colonization surveillance program. It is not intended to diagnose MRSA infection nor to guide or monitor treatment for MRSA infections. Test performance is not FDA approved in patients less than 68 years old. Performed at [facility], [address] [place], [place] [address]   Resp panel by RT-PCR (RSV, Flu A&B, Covid)     Status: None   Collection Time: 10/14/23  2:41 AM  Result Value Ref Range Status   SARS Coronavirus 2 by RT PCR NEGATIVE NEGATIVE Final    Comment: (NOTE) SARS-CoV-2 target nucleic acids are NOT DETECTED.  The SARS-CoV-2 RNA is generally detectable in upper  respiratory specimens during the acute phase of infection. The lowest concentration of SARS-CoV-2 viral copies this assay can detect is 138 copies/mL. A negative result does not preclude SARS-Cov-2 infection and should not be used as the sole basis for treatment or other patient management decisions. A negative result may occur with  improper specimen collection/handling, submission of specimen other than nasopharyngeal swab, presence of viral mutation(s) within the areas targeted by this assay, and inadequate number of viral copies(<138 copies/mL). A negative result must be combined with clinical observations, patient history, and epidemiological information. The expected result is Negative.  Fact Sheet for Patients:  BloggerCourse.com  Fact Sheet for Healthcare Providers:  SeriousBroker.it  This test is no t yet approved or cleared by the [place]  FDA and  has been authorized for detection and/or diagnosis of SARS-CoV-2 by FDA under an Emergency Use Authorization (EUA). This EUA will remain  in effect (meaning this test can be used) for the duration of the COVID-19 declaration  under Section 564(b)(1) of the Act, 21 U.S.C.section 360bbb-3(b)(1), unless the authorization is terminated  or revoked sooner.       Influenza A by PCR NEGATIVE NEGATIVE Final   Influenza B by PCR NEGATIVE NEGATIVE Final    Comment: (NOTE) The Xpert Xpress SARS-CoV-2/FLU/RSV plus assay is intended as an aid in the diagnosis of influenza from Nasopharyngeal swab specimens and should not be used as a sole basis for treatment. Nasal washings and aspirates are unacceptable for Xpert Xpress SARS-CoV-2/FLU/RSV testing.  Fact Sheet for Patients: BloggerCourse.com  Fact Sheet for Healthcare Providers: SeriousBroker.it  This test is not yet approved or cleared by the [place]  FDA and has been  authorized for detection and/or diagnosis of SARS-CoV-2 by FDA under an Emergency Use Authorization (EUA). This EUA will remain in effect (meaning this test can be used) for the duration of the COVID-19 declaration under Section 564(b)(1) of the Act, 21 U.S.C. section 360bbb-3(b)(1), unless the authorization is terminated or revoked.     Resp Syncytial Virus by PCR NEGATIVE NEGATIVE Final    Comment: (NOTE) Fact Sheet for Patients: BloggerCourse.com  Fact Sheet for Healthcare Providers: SeriousBroker.it  This test is not yet approved or cleared by the [place]  FDA and has been authorized for detection and/or diagnosis of SARS-CoV-2 by FDA under an Emergency Use Authorization (EUA). This EUA will remain in effect (meaning this test can be used) for the duration of the COVID-19 declaration under Section 564(b)(1) of the Act, 21 U.S.C. section 360bbb-3(b)(1), unless the authorization is terminated or revoked.  Performed at [facility], [address] [place], [place] [address]   Body fluid culture w Gram Stain     Status: None (Preliminary result)   Collection Time: 10/18/23  1:56 PM   Specimen: PATH Cytology Pleural fluid  Result Value Ref Range Status   Specimen Description   Final    PLEURAL Performed at [facility], [address], [place], [place] [address]    Special Requests   Final    NONE Performed at [facility], [address] [place], [place] [address]    Gram Stain NO WBC SEEN NO ORGANISMS SEEN   Final   Culture   Final    NO GROWTH < 24 HOURS Performed at [facility], [address] [place], [place] [address]    Report Status PENDING  Incomplete    Labs: CBC: Recent Labs  Lab 10/15/23 0411 10/16/23 0439 10/17/23 0418 10/18/23 0442 10/19/23 0342  WBC 4.7 5.3 5.9 5.4 6.6  NEUTROABS 3.1 3.9 4.1 4.4 5.1  HGB 13.4 13.5 13.7 12.4* 13.0  HCT 39.5 41.4 41.2  36.9* 38.6*  MCV 102.1* 103.5* 100.7* 99.5 98.5  PLT 144* 182 237 227 235   Basic Metabolic Panel: Recent Labs  Lab 10/15/23 0411 10/16/23 0439 10/17/23 0418 10/18/23 0442 10/19/23 0342  NA 135 134* 136 135 133*  K 4.4 4.3 4.7 5.3* 4.3  CL 96* 97* 95* 97* 97*  CO2 22 24 26 27 27   GLUCOSE 76 71 77 81 90  BUN 31* 28* 38* 51* 35*  CREATININE 6.47* 5.21* 6.29* 7.27* 5.39*  CALCIUM  9.0 8.8* 9.3 9.1 8.6*  MG 2.4 2.1 2.3 2.2 2.1  PHOS 3.1 3.3 3.9 4.7* 3.7   Liver Function Tests: Recent Labs  Lab 10/15/23 0411 10/16/23 0439 10/17/23 0418 10/18/23 0442 10/19/23 0342  AST 23 23 23 20 30   ALT 9 14 13 14 17   ALKPHOS 44 45  49 44 48  BILITOT 0.9 0.9 1.0 0.9 0.7  PROT 6.6 6.5 7.1 6.5 5.7*  ALBUMIN  3.0* 2.9* 3.1* 2.7* 2.5*   CBG: Recent Labs  Lab 10/15/23 1836  GLUCAP 99    Discharge time spent: 32 minutes.  Signed: Zailen Albarran, DO [facility] 10/19/2023

## 2023-10-19 NOTE — TOC Transition Note (Signed)
 Transition of Care [facility]) - Discharge Note   Patient Details  Name: Todd Brown MRN: [id number] Date of Birth: [birth year]  [facility] CM/SW Contact:  Loman Risk, RN Phone Number: 10/19/2023, 3:27 PM   Clinical Narrative:      Patient will DC to: [name] Anticipated DC date:10/19/23  Family notified: [name] DSS guardian  Transport by:  Katherina Pan, ALF owner   Per MD patient ready for DC to . RN, patient, patient's family, and facility notified of DC. Discharge Summary sent to facility. RN given number for report. DC packet on chart. TOC signing off.        Patient Goals and CMS Choice            Discharge Placement                       Discharge Plan and Services Additional resources added to the After Visit Summary for                                       Social Drivers of Health (SDOH) Interventions SDOH Screenings   Food Insecurity: No Food Insecurity (10/14/2023)  Housing: Low Risk  (10/13/2023)  Transportation Needs: Patient Unable To Answer (10/14/2023)  Recent Concern: Transportation Needs - Unmet Transportation Needs (10/13/2023)  Utilities: Not At Risk (10/13/2023)  Alcohol Screen: Low Risk  (10/05/2019)  Financial Resource Strain: Low Risk  (10/08/2018)  Physical Activity: Unknown (10/08/2018)  Social Connections: Unknown (10/08/2018)  Stress: No Stress Concern Present (10/08/2018)  Tobacco Use: Low Risk  (10/13/2023)     Readmission Risk Interventions    10/15/2023    2:37 PM 07/06/2023    9:31 AM 06/17/2023    2:42 PM  Readmission Risk Prevention Plan  Transportation Screening Complete Complete Complete  PCP or Specialist Appt within 3-5 Days  Complete   Social Work Consult for Recovery Care Planning/Counseling  Complete   Palliative Care Screening  Complete   Medication Review [employer])  Complete Complete  PCP or Specialist appointment within 3-5 days of discharge   Complete  SW Recovery  Care/Counseling Consult Complete  Complete  Palliative Care Screening Complete  Not Applicable  Skilled Nursing Facility   Not Applicable

## 2023-10-19 NOTE — Plan of Care (Signed)

## 2023-10-22 LAB — BODY FLUID CULTURE W GRAM STAIN
Culture: NO GROWTH
Gram Stain: NONE SEEN

## 2023-10-27 LAB — MISC LABCORP TEST (SEND OUT): Labcorp test code: 9985

## 2023-11-04 ENCOUNTER — Ambulatory Visit (INDEPENDENT_AMBULATORY_CARE_PROVIDER_SITE_OTHER): Payer: MEDICAID | Admitting: Vascular Surgery

## 2023-11-04 ENCOUNTER — Encounter (INDEPENDENT_AMBULATORY_CARE_PROVIDER_SITE_OTHER): Payer: MEDICAID

## 2023-11-05 ENCOUNTER — Other Ambulatory Visit: Payer: Self-pay

## 2023-11-05 ENCOUNTER — Encounter: Payer: Self-pay | Admitting: Emergency Medicine

## 2023-11-05 ENCOUNTER — Inpatient Hospital Stay
Admission: EM | Admit: 2023-11-05 | Discharge: 2023-11-10 | DRG: 177 | Disposition: A | Discharge status: Skilled Nursing Facility | Payer: MEDICAID | Attending: Osteopathic Medicine | Admitting: Osteopathic Medicine

## 2023-11-05 ENCOUNTER — Emergency Department: Payer: MEDICAID

## 2023-11-05 ENCOUNTER — Observation Stay: Payer: MEDICAID

## 2023-11-05 DIAGNOSIS — I701 Atherosclerosis of renal artery: Secondary | ICD-10-CM | POA: Diagnosis present

## 2023-11-05 DIAGNOSIS — N186 End stage renal disease: Secondary | ICD-10-CM | POA: Diagnosis not present

## 2023-11-05 DIAGNOSIS — E785 Hyperlipidemia, unspecified: Secondary | ICD-10-CM | POA: Diagnosis present

## 2023-11-05 DIAGNOSIS — I2510 Atherosclerotic heart disease of native coronary artery without angina pectoris: Secondary | ICD-10-CM | POA: Diagnosis present

## 2023-11-05 DIAGNOSIS — N2581 Secondary hyperparathyroidism of renal origin: Secondary | ICD-10-CM | POA: Diagnosis present

## 2023-11-05 DIAGNOSIS — R918 Other nonspecific abnormal finding of lung field: Secondary | ICD-10-CM | POA: Diagnosis present

## 2023-11-05 DIAGNOSIS — D631 Anemia in chronic kidney disease: Secondary | ICD-10-CM | POA: Diagnosis present

## 2023-11-05 DIAGNOSIS — R636 Underweight: Secondary | ICD-10-CM | POA: Diagnosis present

## 2023-11-05 DIAGNOSIS — J690 Pneumonitis due to inhalation of food and vomit: Secondary | ICD-10-CM | POA: Diagnosis not present

## 2023-11-05 DIAGNOSIS — I25119 Atherosclerotic heart disease of native coronary artery with unspecified angina pectoris: Secondary | ICD-10-CM

## 2023-11-05 DIAGNOSIS — J9601 Acute respiratory failure with hypoxia: Principal | ICD-10-CM | POA: Diagnosis present

## 2023-11-05 DIAGNOSIS — J9621 Acute and chronic respiratory failure with hypoxia: Secondary | ICD-10-CM | POA: Diagnosis not present

## 2023-11-05 DIAGNOSIS — E039 Hypothyroidism, unspecified: Secondary | ICD-10-CM | POA: Diagnosis present

## 2023-11-05 DIAGNOSIS — Z992 Dependence on renal dialysis: Secondary | ICD-10-CM

## 2023-11-05 DIAGNOSIS — E1122 Type 2 diabetes mellitus with diabetic chronic kidney disease: Secondary | ICD-10-CM | POA: Diagnosis present

## 2023-11-05 DIAGNOSIS — Z66 Do not resuscitate: Secondary | ICD-10-CM | POA: Diagnosis present

## 2023-11-05 DIAGNOSIS — J449 Chronic obstructive pulmonary disease, unspecified: Secondary | ICD-10-CM | POA: Diagnosis present

## 2023-11-05 DIAGNOSIS — Z841 Family history of disorders of kidney and ureter: Secondary | ICD-10-CM

## 2023-11-05 DIAGNOSIS — Z1152 Encounter for screening for COVID-19: Secondary | ICD-10-CM

## 2023-11-05 DIAGNOSIS — R188 Other ascites: Secondary | ICD-10-CM | POA: Diagnosis present

## 2023-11-05 DIAGNOSIS — Z79890 Hormone replacement therapy (postmenopausal): Secondary | ICD-10-CM

## 2023-11-05 DIAGNOSIS — Z515 Encounter for palliative care: Secondary | ICD-10-CM

## 2023-11-05 DIAGNOSIS — Z681 Body mass index (BMI) 19 or less, adult: Secondary | ICD-10-CM

## 2023-11-05 DIAGNOSIS — F209 Schizophrenia, unspecified: Secondary | ICD-10-CM | POA: Diagnosis present

## 2023-11-05 DIAGNOSIS — Z888 Allergy status to other drugs, medicaments and biological substances status: Secondary | ICD-10-CM

## 2023-11-05 DIAGNOSIS — I120 Hypertensive chronic kidney disease with stage 5 chronic kidney disease or end stage renal disease: Secondary | ICD-10-CM | POA: Diagnosis present

## 2023-11-05 DIAGNOSIS — R0602 Shortness of breath: Secondary | ICD-10-CM

## 2023-11-05 DIAGNOSIS — Z79899 Other long term (current) drug therapy: Secondary | ICD-10-CM

## 2023-11-05 DIAGNOSIS — I10 Essential (primary) hypertension: Secondary | ICD-10-CM | POA: Diagnosis present

## 2023-11-05 LAB — RESP PANEL BY RT-PCR (RSV, FLU A&B, COVID)  RVPGX2
Influenza A by PCR: NEGATIVE
Influenza B by PCR: NEGATIVE
Resp Syncytial Virus by PCR: NEGATIVE
SARS Coronavirus 2 by RT PCR: NEGATIVE

## 2023-11-05 LAB — BASIC METABOLIC PANEL WITH GFR
Anion gap: 17 — ABNORMAL HIGH (ref 5–15)
BUN: 38 mg/dL — ABNORMAL HIGH (ref 8–23)
CO2: 26 mmol/L (ref 22–32)
Calcium: 8.8 mg/dL — ABNORMAL LOW (ref 8.9–10.3)
Chloride: 96 mmol/L — ABNORMAL LOW (ref 98–111)
Creatinine, Ser: 7.38 mg/dL — ABNORMAL HIGH (ref 0.61–1.24)
GFR, Estimated: 8 mL/min — ABNORMAL LOW (ref >60)
Glucose, Bld: 89 mg/dL (ref 70–99)
Potassium: 4.1 mmol/L (ref 3.5–5.1)
Sodium: 139 mmol/L (ref 135–145)

## 2023-11-05 LAB — CBC WITH DIFFERENTIAL/PLATELET
Abs Immature Granulocytes: 0.02 10*3/uL (ref 0.00–0.07)
Basophils Absolute: 0 10*3/uL (ref 0.0–0.1)
Basophils Relative: 1 %
Eosinophils Absolute: 0.1 10*3/uL (ref 0.0–0.5)
Eosinophils Relative: 1 %
HCT: 47.3 % (ref 39.0–52.0)
Hemoglobin: 15.7 g/dL (ref 13.0–17.0)
Immature Granulocytes: 0 %
Lymphocytes Relative: 8 %
Lymphs Abs: 0.7 10*3/uL (ref 0.7–4.0)
MCH: 32.9 pg (ref 26.0–34.0)
MCHC: 33.2 g/dL (ref 30.0–36.0)
MCV: 99.2 fL (ref 80.0–100.0)
Monocytes Absolute: 0.8 10*3/uL (ref 0.1–1.0)
Monocytes Relative: 9 %
Neutro Abs: 6.7 10*3/uL (ref 1.7–7.7)
Neutrophils Relative %: 81 %
Platelets: 212 10*3/uL (ref 150–400)
RBC: 4.77 MIL/uL (ref 4.22–5.81)
RDW: 13.8 % (ref 11.5–15.5)
WBC: 8.3 10*3/uL (ref 4.0–10.5)
nRBC: 0 % (ref 0.0–0.2)

## 2023-11-05 MED ORDER — CARBAMAZEPINE 200 MG PO TABS
1000.0000 mg | ORAL_TABLET | Freq: Every morning | ORAL | Status: DC
  Administered 2023-11-06 – 2023-11-10 (×5): 1000 mg via ORAL
  Filled 2023-11-05 (×5): qty 5

## 2023-11-05 MED ORDER — SODIUM CHLORIDE 0.9 % IV SOLN
1.0000 g | Freq: Once | INTRAVENOUS | Status: AC
  Administered 2023-11-05: 1 g via INTRAVENOUS
  Filled 2023-11-05: qty 10

## 2023-11-05 MED ORDER — SODIUM CHLORIDE 0.9% FLUSH
3.0000 mL | Freq: Two times a day (BID) | INTRAVENOUS | Status: DC
  Administered 2023-11-05 – 2023-11-10 (×10): 3 mL via INTRAVENOUS

## 2023-11-05 MED ORDER — ONDANSETRON HCL 4 MG PO TABS: 4.0000 mg | ORAL_TABLET | Freq: Four times a day (QID) | ORAL | Status: DC | PRN

## 2023-11-05 MED ORDER — HEPARIN SODIUM (PORCINE) 5000 UNIT/ML IJ SOLN
5000.0000 [IU] | Freq: Three times a day (TID) | INTRAMUSCULAR | Status: DC
  Administered 2023-11-05 – 2023-11-10 (×14): 5000 [IU] via SUBCUTANEOUS
  Filled 2023-11-05 (×14): qty 1

## 2023-11-05 MED ORDER — ONDANSETRON HCL 4 MG/2ML IJ SOLN: 4.0000 mg | Freq: Four times a day (QID) | INTRAMUSCULAR | Status: DC | PRN

## 2023-11-05 MED ORDER — POLYETHYLENE GLYCOL 3350 17 G PO PACK: 17.0000 g | PACK | Freq: Every day | ORAL | Status: DC | PRN

## 2023-11-05 MED ORDER — FLUTICASONE FUROATE-VILANTEROL 200-25 MCG/ACT IN AEPB
1.0000 | INHALATION_SPRAY | Freq: Every day | RESPIRATORY_TRACT | Status: DC
  Administered 2023-11-06 – 2023-11-10 (×5): 1 via RESPIRATORY_TRACT
  Filled 2023-11-05: qty 28

## 2023-11-05 MED ORDER — IPRATROPIUM-ALBUTEROL 0.5-2.5 (3) MG/3ML IN SOLN
3.0000 mL | Freq: Four times a day (QID) | RESPIRATORY_TRACT | Status: DC
  Administered 2023-11-05 – 2023-11-06 (×2): 3 mL via RESPIRATORY_TRACT
  Filled 2023-11-05: qty 3

## 2023-11-05 MED ORDER — SODIUM CHLORIDE 0.9 % IV SOLN
500.0000 mg | Freq: Once | INTRAVENOUS | Status: AC
  Administered 2023-11-05: 500 mg via INTRAVENOUS
  Filled 2023-11-05: qty 5

## 2023-11-05 MED ORDER — ACETAMINOPHEN 325 MG PO TABS
650.0000 mg | ORAL_TABLET | Freq: Four times a day (QID) | ORAL | Status: DC | PRN
  Administered 2023-11-08 – 2023-11-09 (×3): 650 mg via ORAL
  Filled 2023-11-05 (×2): qty 2

## 2023-11-05 MED ORDER — LORAZEPAM 1 MG PO TABS
1.0000 mg | ORAL_TABLET | Freq: Three times a day (TID) | ORAL | Status: DC | PRN
  Administered 2023-11-06 – 2023-11-10 (×6): 1 mg via ORAL
  Filled 2023-11-05 (×6): qty 1

## 2023-11-05 MED ORDER — FERROUS SULFATE 325 (65 FE) MG PO TABS
325.0000 mg | ORAL_TABLET | Freq: Every day | ORAL | Status: DC
  Administered 2023-11-06 – 2023-11-10 (×5): 325 mg via ORAL
  Filled 2023-11-05 (×5): qty 1

## 2023-11-05 MED ORDER — ROSUVASTATIN CALCIUM 10 MG PO TABS
10.0000 mg | ORAL_TABLET | Freq: Every day | ORAL | Status: DC
  Administered 2023-11-06 – 2023-11-09 (×3): 10 mg via ORAL
  Filled 2023-11-05 (×3): qty 1

## 2023-11-05 MED ORDER — TRIHEXYPHENIDYL HCL 2 MG PO TABS
2.0000 mg | ORAL_TABLET | Freq: Two times a day (BID) | ORAL | Status: DC
  Administered 2023-11-05 – 2023-11-10 (×9): 2 mg via ORAL
  Filled 2023-11-05 (×12): qty 1

## 2023-11-05 MED ORDER — LEVOTHYROXINE SODIUM 50 MCG PO TABS
125.0000 ug | ORAL_TABLET | Freq: Every day | ORAL | Status: DC
  Administered 2023-11-06 – 2023-11-10 (×4): 125 ug via ORAL
  Filled 2023-11-05 (×4): qty 1

## 2023-11-05 MED ORDER — IPRATROPIUM-ALBUTEROL 0.5-2.5 (3) MG/3ML IN SOLN
3.0000 mL | Freq: Once | RESPIRATORY_TRACT | Status: AC
  Administered 2023-11-05: 3 mL via RESPIRATORY_TRACT
  Filled 2023-11-05: qty 3

## 2023-11-05 MED ORDER — PANTOPRAZOLE SODIUM 40 MG PO TBEC
40.0000 mg | DELAYED_RELEASE_TABLET | Freq: Every day | ORAL | Status: DC
  Administered 2023-11-06 – 2023-11-10 (×5): 40 mg via ORAL
  Filled 2023-11-05 (×5): qty 1

## 2023-11-05 MED ORDER — SODIUM CHLORIDE 0.9 % IV SOLN
3.0000 g | Freq: Two times a day (BID) | INTRAVENOUS | Status: DC
  Administered 2023-11-05 – 2023-11-10 (×10): 3 g via INTRAVENOUS
  Filled 2023-11-05 (×11): qty 8

## 2023-11-05 MED ORDER — ACETAMINOPHEN 650 MG RE SUPP
650.0000 mg | Freq: Four times a day (QID) | RECTAL | Status: DC | PRN
Start: 2023-11-05 — End: 2023-11-10

## 2023-11-05 MED ORDER — ISOSORBIDE MONONITRATE ER 30 MG PO TB24
60.0000 mg | ORAL_TABLET | Freq: Every day | ORAL | Status: DC
  Administered 2023-11-06 – 2023-11-10 (×5): 60 mg via ORAL
  Filled 2023-11-05 (×5): qty 2

## 2023-11-05 NOTE — Assessment & Plan Note (Signed)
 Continue home Synthroid

## 2023-11-05 NOTE — ED Provider Notes (Signed)
 [facility] Provider Note    Event Date/Time   First MD Initiated Contact with Patient 11/05/23 1212     (approximate)   History   Shortness of Breath   HPI Todd Brown is a 63 y.o. male with history of ESRD on dialysis, COPD, HTN, HLD, schizophrenia presenting today for shortness of breath.  Patient chronically on 2 L.  He was at his dialysis session when they reported he was getting short of breath and was reportedly hypoxic at 88% on his baseline 2 L.  EMS was called and did not note any hypoxia.  They noted he was eating at the time of event and was coughing it out but that improved on the way to the ED.  He otherwise denies any acute complaints like chest pain, abdominal pain.  Has had some mild congestion and cough recently.  Patient frequently does not finish dialysis due to noncompliance.  Chart review: Patient discharged from the hospital on 10/19/2023 for a postobstructive pneumonia secondary to likely lung mass.  Oncology was consulted at that time and patient was a poor candidate for aggressive cancer treatment.  Extensive discussion with his legal guardian with no aggressive treatments for the malignancy but otherwise continue regular medications and dialysis.     Physical Exam   Triage Vital Signs: ED Triage Vitals  Encounter Vitals Group     BP      Systolic BP Percentile      Diastolic BP Percentile      Pulse      Resp      Temp      Temp src      SpO2      Weight      Height      Head Circumference      Peak Flow      Pain Score      Pain Loc      Pain Education      Exclude from Growth Chart     Most recent vital signs: Vitals:   11/05/23 1226 11/05/23 1230  BP:  (!) 136/94  Pulse: 83   Resp: 16   Temp: 98.1 F (36.7 C)   SpO2: 90%     I have reviewed the vital signs. General:  Awake, alert, no acute distress. Head:  Normocephalic, Atraumatic. EENT:  PERRL, EOMI, Oral mucosa pink and moist, Neck is  supple. Cardiovascular: Regular rate, 2+ distal pulses. Respiratory: Excessively coughing on exam with occasional tachypnea.  Dry cough.  Crackles scattered throughout but most prominent on the right side.  No wheezing Extremities:  Moving all four extremities through full ROM without pain.   Neuro:  Alert and oriented.  Interacting appropriately.   Skin:  Warm, dry, no rash.   Psych: Appropriate affect.    ED Results / Procedures / Treatments   Labs (all labs ordered are listed, but only abnormal results are displayed) Labs Reviewed  BASIC METABOLIC PANEL WITH GFR - Abnormal; Notable for the following components:      Result Value   Chloride 96 (*)    BUN 38 (*)    Creatinine, Ser 7.38 (*)    Calcium  8.8 (*)    GFR, Estimated 8 (*)    Anion gap 17 (*)    All other components within normal limits  RESP PANEL BY RT-PCR (RSV, FLU A&B, COVID)  RVPGX2  CBC WITH DIFFERENTIAL/PLATELET     EKG My EKG interpretation: Rate of 82, normal  sinus rhythm, normal axis, normal intervals.  No acute ST elevations or depressions   RADIOLOGY Independently interpreted chest x-ray with evidence of infiltrates along the right side   PROCEDURES:  Critical Care performed: Yes, see critical care procedure note(s)  .Critical Care  Performed by: Kandee Orion, MD Authorized by: Kandee Orion, MD   Critical care provider statement:    Critical care time (minutes):  30   Critical care was necessary to treat or prevent imminent or life-threatening deterioration of the following conditions:  Respiratory failure   Critical care was time spent personally by me on the following activities:  Development of treatment plan with patient or surrogate, discussions with consultants, evaluation of patient's response to treatment, examination of patient, ordering and review of laboratory studies, ordering and review of radiographic studies, ordering and performing treatments and interventions, pulse oximetry,  re-evaluation of patient's condition and review of old charts   I assumed direction of critical care for this patient from another provider in my specialty: no     Care discussed with: admitting provider      MEDICATIONS ORDERED IN ED: Medications  ipratropium-albuterol  (DUONEB) 0.5-2.5 (3) MG/3ML nebulizer solution 3 mL (has no administration in time range)  cefTRIAXone  (ROCEPHIN ) 1 g in sodium chloride  0.9 % 100 mL IVPB (has no administration in time range)  azithromycin  (ZITHROMAX ) 500 mg in sodium chloride  0.9 % 250 mL IVPB (has no administration in time range)     IMPRESSION / MDM / ASSESSMENT AND PLAN / ED COURSE  I reviewed the triage vital signs and the nursing notes.                              Differential diagnosis includes, but is not limited to, pneumonia, COVID, flu, RSV, aspiration, COPD, volume overload  Patient's presentation is most consistent with acute presentation with potential threat to life or bodily function.  Patient is a 63 year old male presenting today for cough, shortness of breath, and hypoxia on his baseline 2 L.  Initially turned him back to 2 L when saturations were maintaining above 88% with his COPD.  Patient did have frequent coughing fits and difficulty getting any phlegm up with occasional oxygen dropping to 86% requiring him to be placed back on 2 L.  CBC negative.  BMP shows his baseline ESRD on dialysis with no severe electrolyte derangements after only completing 40 minutes.  Negative for COVID, flu, RSV.  Chest x-ray shows infiltrates on the right side which is difficult to discern between acute on chronic.  Given his new hypoxia and ongoing difficulty with coughing and concern for aspiration, will admit to hospitalist for ongoing care.  CT chest ordered and nephrology notified of patient to get on the dialysis schedule.  Hospitalist has agreed to admit patient for further care.  Given ceftriaxone , azithromycin , and DuoNeb.  The patient is on the  cardiac monitor to evaluate for evidence of arrhythmia and/or significant heart rate changes. Clinical Course as of 11/05/23 1451  Fri Nov 05, 2023  1435 Patient with ongoing severe cough and oxygenation occasionally will drop to 86%.  Will increase to 4 L.  Will give DuoNeb and get a follow-up CT chest.  Will plan to admit for ongoing evaluation from a respiratory standpoint.  Nephrology notified that patient will need dialysis while here. [DW]    Clinical Course User Index [DW] Kandee Orion, MD     FINAL CLINICAL  IMPRESSION(S) / ED DIAGNOSES   Final diagnoses:  Acute hypoxic respiratory failure (HCC)  Shortness of breath     Rx / DC Orders   ED Discharge Orders     None        Note:  This document was prepared using Dragon voice recognition software and may include unintentional dictation errors.   Kandee Orion, MD 11/05/23 [id number]

## 2023-11-05 NOTE — Assessment & Plan Note (Signed)
 On semination, it appears that patient's ascites has reaccumulated since his last paracentesis on May 19.  Does not seem to be causing significant discomfort at this time though.  - Consider repeat paracentesis prior to discharge

## 2023-11-05 NOTE — H&P (Signed)
 History and Physical    Patient: Todd Brown:[id number] DOB: 12-Feb-1961 DOA: 11/05/2023 DOS: the patient was seen and examined on 11/05/2023 PCP: Janann Meadow, NP  Patient coming from: Home  Chief Complaint:  Chief Complaint  Patient presents with   Shortness of Breath   HPI: Todd Brown is a 63 y.o. male with medical history significant of ESRD on HD (MWF), schizophrenia, hypertension, hyperlipidemia, COPD with chronic hypoxic respiratory failure on 2 L, CAD, recurrent ascites requiring paracentesis, hypothyroidism, blindness, who presents to the ED due to shortness of breath.  History obtained through chart review due to patient's altered mental status and noncooperation. Per chart review, EMS was called to the dialysis center when patient developed shortness of breath approximately 45 minutes after starting his dialysis.  On their arrival, they noted that he was hypoxic on his home 2 L.  In addition, he was eating and continuously coughing while eating with a weak cough.  On arrival to the ED, he denied any chest pain, nausea, vomiting, abdominal pain.  He endorsed congestion and cough only.  My personal evaluation, patient not wanting to answer questions and resisting physical exam.  ED course: On arrival to the ED, patient was hypertensive at 136/94 with heart rate of 84.  He was saturating at 90% on 2 L.  He was afebrile at 98.1. Initial workup notable for unremarkable CBC and BMP with BUN of 38, creatinine 7.3 and GFR of 8.  COVID-19, influenza and RSV PCR negative.  Chest x-ray with similar round lobular right mid to lower lobe opacity and similar streaky opacities in the left lung.  Patient started on azithromycin , Rocephin , and DuoNebs.  TRH contacted for admission.  Review of Systems: As mentioned in the history of present illness. All other systems reviewed and are negative.  Past Medical History:  Diagnosis Date   Anemia    Arthritis    Chronic respiratory  failure (HCC)    COPD (chronic obstructive pulmonary disease) (HCC)    Dialysis patient (HCC)    Mon. 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   ESRD (end stage renal disease) (HCC)    ESRD (end stage renal disease) on dialysis (HCC)    Hyperlipidemia    Hypertension    Malignant hypertension    Pneumonia 2016   Renal artery stenosis (HCC)    Schizophrenia (HCC)    Past Surgical History:  Procedure Laterality Date   A/V FISTULAGRAM N/A 08/06/2016   Procedure: A/V Fistulagram;  Surgeon: Celso College, MD;  Location: [facility];  Service: Cardiovascular;  Laterality: N/A;   A/V FISTULAGRAM N/A 02/14/2021   Procedure: A/V FISTULAGRAM poss Perm Cath Insertion;  Surgeon: Jackquelyn Mass, MD;  Location: [facility];  Service: Cardiovascular;  Laterality: N/A;   A/V FISTULAGRAM Left 08/26/2021   Procedure: A/V Fistulagram;  Surgeon: Celso College, MD;  Location: [facility];  Service: Cardiovascular;  Laterality: Left;   A/V SHUNT INTERVENTION N/A 08/06/2016   Procedure: A/V Shunt Intervention;  Surgeon: Celso College, MD;  Location: [facility];  Service: Cardiovascular;  Laterality: N/A;   AV FISTULA PLACEMENT Left 12/26/2014   Procedure: ARTERIOVENOUS (AV) FISTULA CREATION;  Surgeon: Celso College, MD;  Location: [facility];  Service: Vascular;  Laterality: Left;   AV FISTULA PLACEMENT     ESOPHAGOGASTRODUODENOSCOPY (EGD) WITH PROPOFOL  N/A 05/06/2017   Procedure: ESOPHAGOGASTRODUODENOSCOPY (EGD)  WITH PROPOFOL ;  Surgeon: Luke Salaam, MD;  Location: [facility];  Service: Gastroenterology;  Laterality: N/A;   INSERTION OF DIALYSIS CATHETER Right    PERIPHERAL VASCULAR CATHETERIZATION N/A 10/22/2014   Procedure: Dialysis/Perma Catheter Insertion;  Surgeon: Celso College, MD;  Location: [facility];  Service: Cardiovascular;  Laterality: N/A;   PERIPHERAL VASCULAR CATHETERIZATION N/A 02/28/2015    Procedure: Dialysis/Perma Catheter Removal;  Surgeon: Celso College, MD;  Location: [facility];  Service: Cardiovascular;  Laterality: N/A;   Repair fx left lower leg     yrs ago (age 70)   REVISON OF ARTERIOVENOUS FISTULA Left 03/27/2022   Procedure: REVISON OF ARTERIOVENOUS FISTULA (JUMP GRAFT);  Surgeon: Jackquelyn Mass, MD;  Location: [facility];  Service: Vascular;  Laterality: Left;   TEE WITHOUT CARDIOVERSION N/A 09/21/2022   Procedure: TRANSESOPHAGEAL ECHOCARDIOGRAM;  Surgeon: Constancia Delton, MD;  Location: [facility];  Service: Cardiovascular;  Laterality: N/A;   TONSILLECTOMY     Social History:  reports that he has never smoked. He has never been exposed to tobacco smoke. He has never used smokeless tobacco. He reports that he does not currently use alcohol. He reports that he does not currently use drugs after having used the following drugs: Marijuana.  Allergies  Allergen Reactions   Chlorpromazine Other (See Comments)    Reaction:  Unknown , pt states it makes him feel real bad Reaction:  Unknown , pt states it makes him feel real bad     Family History  Problem Relation Age of Onset   Kidney disease Brother    Diabetes Neg Hx     Prior to Admission medications   Medication Sig Start Date End Date Taking? Authorizing Provider  albuterol  (VENTOLIN  HFA) 108 (90 Base) MCG/ACT inhaler Inhale 1-2 puffs into the lungs every 4 (four) hours as needed for wheezing or shortness of breath.   Yes [provider]  ascorbic acid  (VITAMIN C ) 500 MG tablet Take 500 mg by mouth daily.   Yes [provider]  benzonatate  (TESSALON ) 100 MG capsule Take 100 mg by mouth every 6 (six) hours as needed. 09/28/23  Yes [provider]  carbamazepine  (TEGRETOL ) 200 MG tablet Take 1,000 mg by mouth every morning.   Yes [provider]  ferrous sulfate  325 (65 FE) MG tablet Take 1 tablet (325 mg total) by mouth daily. 05/19/15  Yes Patel, Sona, MD   Fluticasone -Salmeterol (ADVAIR) 250-50 MCG/DOSE AEPB Inhale 1 puff into the lungs 2 (two) times daily.   Yes [provider]  isosorbide  mononitrate (IMDUR ) 60 MG 24 hr tablet Take 1 tablet (60 mg total) by mouth daily. 06/19/23  Yes Brenna Cam, MD  levothyroxine  (SYNTHROID ) 125 MCG tablet Take 1 tablet (125 mcg total) by mouth daily. 11/08/22 11/05/23 Yes Wieting, Richard, MD  omeprazole  (PRILOSEC) 40 MG capsule Take 40 mg by mouth every morning.   Yes [provider]  rosuvastatin  (CRESTOR ) 10 MG tablet Take 10 mg by mouth at bedtime. 04/19/23  Yes [provider]  b complex-vitamin c -folic acid  (NEPHRO-VITE) 0.8 MG TABS tablet Take 1 tablet by mouth at bedtime.    [provider]  cholecalciferol  (VITAMIN D ) 1000 UNITS tablet Take 1,000 Units by mouth in the morning.    [provider]  hyoscyamine  (LEVSIN SL) 0.125 MG SL tablet Place 0.125 mg under the tongue every 4 (four) hours as needed for cramping.    [provider]  ipratropium-albuterol  (DUONEB) 0.5-2.5 (3) MG/3ML  SOLN Take 3 mLs by nebulization every 6 (six) hours as needed. 05/04/23   Alexander, Natalie, DO  LORazepam  (ATIVAN ) 0.5 MG tablet Take 0.5 mg by mouth every 4 (four) hours as needed for anxiety or sleep (agitation, or restlessness). 09/10/23   [provider]  nicotine  (NICODERM CQ  - DOSED IN MG/24 HOURS) 21 mg/24hr patch Place 1 patch (21 mg total) onto the skin daily. 05/05/23   Alexander, Natalie, DO  polyethylene glycol (MIRALAX  / GLYCOLAX ) 17 g packet Take 17 g by mouth daily as needed for mild constipation.    [provider]  senna-docusate (SENOKOT-S) 8.6-50 MG tablet Take 1 tablet by mouth 2 (two) times daily as needed for mild constipation.    [provider]  traMADol  (ULTRAM ) 50 MG tablet Take 50 mg by mouth every 6 (six) hours as needed for moderate pain (pain score 4-6). 06/23/23   [provider]  trihexyphenidyl  (ARTANE ) 2 MG  tablet Take 2 mg by mouth 2 (two) times daily.    [provider]  zinc  sulfate 220 (50 Zn) MG capsule Take 220 mg by mouth daily.    [provider]    Physical Exam: Vitals:   11/05/23 1222 11/05/23 1226 11/05/23 1230 11/05/23 1558  BP:   (!) 136/94 (!) 137/92  Pulse:  83  79  Resp:  16  16  Temp:  98.1 F (36.7 C)  98.3 F (36.8 C)  TempSrc:  Oral  Oral  SpO2:  90%  94%  Weight: 74.6 kg     Height: 6\' 3"  (1.905 m)      Physical Exam Vitals and nursing note reviewed.  Constitutional:      General: He is not in acute distress.    Appearance: He is normal weight.  HENT:     Head: Normocephalic and atraumatic.  Cardiovascular:     Rate and Rhythm: Normal rate and regular rhythm.  Pulmonary:     Effort: Pulmonary effort is normal. No tachypnea.     Breath sounds: Transmitted upper airway sounds present. Decreased breath sounds (Right) present.     Comments: Repeatedly coughing, clearing his throat during exam Abdominal:     General: There is distension.     Palpations: Abdomen is soft.     Tenderness: There is no abdominal tenderness.  Musculoskeletal:     Right lower leg: No edema.     Left lower leg: No edema.  Skin:    General: Skin is warm and dry.  Neurological:     Mental Status: He is alert.     Comments:  Patient is very difficult to understand, a dentulous.  Moving all extremities against gravity  Psychiatric:        Behavior: Behavior is agitated.     Data Reviewed: CBC with WBC of 8.3, hemoglobin of 15.7, platelets of 212 BMP with sodium of 139, potassium 4.1, bicarb 26, glucose 89, BUN 38, creatinine 7.8, anion gap 20, GFR 58 COVID-19, influenza and RSV PCR negative  EKG personally reviewed.  Sinus rhythm with rate of 82.  No acute ischemic changes.  CT Chest Wo Contrast Result Date: 11/05/2023 CLINICAL DATA:  Shortness of breath and hypoxia. Possible aspiration with pneumonia not excluded on a portable chest radiograph earlier  today. There was a continued mass-like opacity in the right lung suspicious for underlying malignancy. EXAM: CT CHEST WITHOUT CONTRAST TECHNIQUE: Multidetector CT imaging of the chest was performed following the standard protocol without IV contrast. RADIATION DOSE REDUCTION:  This exam was performed according to the departmental dose-optimization program which includes automated exposure control, adjustment of the mA and/or kV according to patient size and/or use of iterative reconstruction technique. COMPARISON:  Chest CT dated 10/13/2023. Portable chest radiographs obtained earlier today and on 10/13/2023. FINDINGS: Cardiovascular: Mild atheromatous calcifications, including the coronary arteries and aorta. Stable mildly enlarged heart. Slight increase in size of a small pericardial effusion with a maximum thickness of 6 mm. The central pulmonary arteries remain enlarged with a main pulmonary artery diameter of 3.9 cm. Mediastinum/Nodes: Enlarged right paratracheal lymph nodes are again demonstrated. A previously demonstrated index 12 mm short axis node has a short axis diameter of 10 mm today. Unremarkable thyroid  gland, trachea and esophagus. A small hiatal hernia is again noted. Lungs/Pleura: Again demonstrated is dense consolidation in the lower lobes with air bronchograms and a more confluent area of density in the right lower lobe suggesting a mass. This includes a hilar component encasing the proximal right upper lobe, right lower lobe and bronchus intermedius with diffuse narrowing of those bronchi, without significant change. The combined hilar and right lower lobe mass-like component currently measures 10.9 x 7.9 cm on image number 86/2, previously 9.5 x 6.8 cm. The previously demonstrated 12 mm right lower lobe nodule currently measures 13 mm in maximum diameter on image number 81/4. No new nodules seen. Stable mild changes of centrilobular emphysema throughout both lungs. Moderate-sized pleural  effusion with probable partial loculations has increased mildly in size. No significant change in a small left pleural effusion. Upper Abdomen: A moderate to large amount of ascites is again demonstrated with enlarged lateral segment left lobe and caudate lobes of the liver suggesting possible cirrhosis of the liver. The spleen remains normal in size. Both kidneys remain small and contain multiple incompletely evaluated cysts not needing specific imaging follow-up at this time as well as a 2 mm calculus in each kidney. Musculoskeletal: Mild thoracic and moderate lower cervical spine degenerative changes. Stable healed mid sternal fracture. IMPRESSION: 1. Stable dense consolidation in the lower lobes with air bronchograms and a more confluent area of density in the right lower lobe suggesting a mass. This includes a hilar component encasing the proximal right upper lobe, right lower lobe and bronchus intermedius with diffuse narrowing of those bronchi. The combined hilar and right lower lobe mass-like component has increased mildly in size, currently measuring 10.9 x 7.9 cm. 2. Mild increase in size of a moderate-sized right pleural effusion with probable partial loculations. 3. No significant change in a small left pleural effusion. 4. Slight increase in size of a small pericardial effusion. 5. Stable to slightly improved mild right paratracheal adenopathy. This could be metastatic or reactive in nature. 6. Stable mild changes of centrilobular emphysema throughout both lungs. 7. Stable enlargement of the central pulmonary arteries, suggesting pulmonary arterial hypertension. 8. Stable moderate to large amount of ascites in the upper abdomen with liver findings suggesting possible cirrhosis of the liver. 9. Stable bilateral renal atrophy with multiple incompletely evaluated cysts not needing specific imaging follow-up at this time as well as a 2 mm calculus in each kidney. 10. Stable small hiatal hernia. Aortic  Atherosclerosis (ICD10-I70.0) and Emphysema (ICD10-J43.9). Electronically Signed   By: Catherin Closs M.D.   On: 11/05/2023 16:01   DG Chest Portable 1 View Result Date: 11/05/2023 CLINICAL DATA:  Shortness of breath. EXAM: PORTABLE CHEST 1 VIEW COMPARISON:  10/13/2023. FINDINGS: Stable cardiomediastinal silhouette. Similar round lobular right mid to lower lobe opacity,  corresponding to area of masslike opacity noted in the right lung on the prior CT chest dated 10/13/2023. Persistent right basilar atelectasis with small right effusion. Similar streaky opacities in the left lung with left lower lobe nodular appearing opacity, which may correspond to the previously noted left lower lobe nodule on the prior CT chest 10/13/2023. No pneumothorax. No acute osseous abnormality. IMPRESSION: 1. Similar round lobular right mid to lower lobe opacity, corresponding to area of reported masslike opacity in the right lung on the prior CT chest dated 10/13/2023, concerning for underlying malignancy. Superimposed infiltrate or edema cannot be excluded. 2. Similar streaky opacities in the left lung, which could reflect infiltrate or edema, with left lower lobe nodular appearing opacity, which may correspond to the previously noted left lower lobe nodule on the prior CT chest. Electronically Signed   By: Mannie Seek M.D.   On: 11/05/2023 13:17   Results are pending, will review when available.  Assessment and Plan:  * Acute on chronic hypoxic respiratory failure (HCC) Patient is presenting after developing shortness of breath during dialysis with initial requirement of 8 L prior to being quickly weaned back down to home 2 L.  He is coughing and hacking repeatedly saying.  I suspect that enlarging lung mass is playing a significant role here given evidence of bronchial narrowing on CT today.  In addition, he is a high aspiration risk and may be repeatedly aspirating while eating, as this is what he was doing when his  shortness of breath started.  - Continue supplemental oxygen to maintain oxygen saturation above 88% - Will need to readdress goals of care as patient will continue to decline.  Palliative care consulted - Continue antibiotics for now, although suspect a very low utility in the setting.  Aspiration pneumonitis (HCC) On EMS arrival, patient was noted to be eating and coughing/hacking repeatedly.  On CT, no new findings other than enlarging known lung mass.  - SLP evaluation - Dysphagia 1 diet  Lung mass On previous admission, CT imaging demonstrated a large right lower lobe with an associated effusion, in addition to a left lower lobe nodule.  Overall, strong suspicion for underlying malignancy, for which patient and his legal guardian have declined further workup.  CT imaging today demonstrates progression.  - As noted above, palliative care consulted  ESRD on hemodialysis [facility]) - Nephrology consulted; appreciate their recommendations - Dialysis per nephrology  COPD (chronic obstructive pulmonary disease) (HCC) - DuoNebs every 6 hours - Continue home bronchodilators  Ascites On semination, it appears that patient's ascites has reaccumulated since his last paracentesis on May 19.  Does not seem to be causing significant discomfort at this time though.  - Consider repeat paracentesis prior to discharge  Hypothyroidism - Continue home Synthroid   CAD (coronary artery disease) No ported chest pain at this time.  - Continue home regimen  Essential hypertension - Continue home regimen  Schizophrenia (HCC) - Continue home regimen  Advance Care Planning:   Code Status: Limited: Do not attempt resuscitation (DNR) -DNR-LIMITED -Do Not Intubate/DNI  per palliative care discussions with patient's legal guardian on 10/14/2023.   Consults: Nephrology  Family Communication: No family at bedside  Severity of Illness: The appropriate patient status for this patient is OBSERVATION.  Observation status is judged to be reasonable and necessary in order to provide the required intensity of service to ensure the patient's safety. The patient's presenting symptoms, physical exam findings, and initial radiographic and laboratory data in the context of  their medical condition is felt to place them at decreased risk for further clinical deterioration. Furthermore, it is anticipated that the patient will be medically stable for discharge from the hospital within 2 midnights of admission.   Author: Avi Body, MD 11/05/2023 4:29 PM  For on call review [email or web address].

## 2023-11-05 NOTE — ED Triage Notes (Signed)
 Pt to ED via [facility] from dialysis for difficulty breathing during his treatment. Pt was on 2 liters at dialysis and sats were 88%, EMS put pt on 6 liters and sats improved to 98%. Pt only got 40 minutes of his treatment. Per dialysis staff, pt never finishes his treatment because he gets angry and leaves. Pt is coughing and making sound as if he is trying to clear his throat. Pts fistula is still accessed.

## 2023-11-05 NOTE — Assessment & Plan Note (Signed)
-   Nephrology consulted; appreciate their recommendations - Dialysis per nephrology

## 2023-11-05 NOTE — Assessment & Plan Note (Signed)
 -  Continue home regimen

## 2023-11-05 NOTE — Assessment & Plan Note (Signed)
 On EMS arrival, patient was noted to be eating and coughing/hacking repeatedly.  On CT, no new findings other than enlarging known lung mass.  - SLP evaluation - Dysphagia 1 diet

## 2023-11-05 NOTE — Assessment & Plan Note (Signed)
-   DuoNebs every 6 hours - Continue home bronchodilators

## 2023-11-05 NOTE — Assessment & Plan Note (Signed)
 No ported chest pain at this time.  - Continue home regimen

## 2023-11-05 NOTE — Assessment & Plan Note (Signed)
 Patient is presenting after developing shortness of breath during dialysis with initial requirement of 8 L prior to being quickly weaned back down to home 2 L.  He is coughing and hacking repeatedly saying.  I suspect that enlarging lung mass is playing a significant role here given evidence of bronchial narrowing on CT today.  In addition, he is a high aspiration risk and may be repeatedly aspirating while eating, as this is what he was doing when his shortness of breath started.  - Continue supplemental oxygen to maintain oxygen saturation above 88% - Will need to readdress goals of care as patient will continue to decline.  Palliative care consulted - Continue antibiotics for now, although suspect a very low utility in the setting.

## 2023-11-05 NOTE — Plan of Care (Signed)

## 2023-11-05 NOTE — Progress Notes (Signed)
 Patient received from ED and settled in room. Upon assessment, HD catheter noted to have blood in line, clamped, and attached to NS syringe. MD, nephrology, and Dialysis nurse informed. Advised to clean line, aspirate, flush, clamp, and cap line with blue dialysis cap. Venous line has visible clot in line, author did not attempt to flush catheter. Catheter cleansed, capped, and tagged. Dialysis nurse and MD notified.

## 2023-11-05 NOTE — Assessment & Plan Note (Signed)
 On previous admission, CT imaging demonstrated a large right lower lobe with an associated effusion, in addition to a left lower lobe nodule.  Overall, strong suspicion for underlying malignancy, for which patient and his legal guardian have declined further workup.  CT imaging today demonstrates progression.  - As noted above, palliative care consulted

## 2023-11-06 DIAGNOSIS — J690 Pneumonitis due to inhalation of food and vomit: Secondary | ICD-10-CM | POA: Diagnosis present

## 2023-11-06 DIAGNOSIS — J9621 Acute and chronic respiratory failure with hypoxia: Secondary | ICD-10-CM | POA: Diagnosis present

## 2023-11-06 DIAGNOSIS — F209 Schizophrenia, unspecified: Secondary | ICD-10-CM | POA: Diagnosis present

## 2023-11-06 DIAGNOSIS — Z1152 Encounter for screening for COVID-19: Secondary | ICD-10-CM | POA: Diagnosis not present

## 2023-11-06 DIAGNOSIS — Z79890 Hormone replacement therapy (postmenopausal): Secondary | ICD-10-CM | POA: Diagnosis not present

## 2023-11-06 DIAGNOSIS — N2581 Secondary hyperparathyroidism of renal origin: Secondary | ICD-10-CM | POA: Diagnosis present

## 2023-11-06 DIAGNOSIS — Z515 Encounter for palliative care: Secondary | ICD-10-CM

## 2023-11-06 DIAGNOSIS — E039 Hypothyroidism, unspecified: Secondary | ICD-10-CM | POA: Diagnosis present

## 2023-11-06 DIAGNOSIS — Z841 Family history of disorders of kidney and ureter: Secondary | ICD-10-CM | POA: Diagnosis not present

## 2023-11-06 DIAGNOSIS — D631 Anemia in chronic kidney disease: Secondary | ICD-10-CM | POA: Diagnosis present

## 2023-11-06 DIAGNOSIS — N186 End stage renal disease: Secondary | ICD-10-CM | POA: Diagnosis present

## 2023-11-06 DIAGNOSIS — J9601 Acute respiratory failure with hypoxia: Principal | ICD-10-CM | POA: Diagnosis present

## 2023-11-06 DIAGNOSIS — R636 Underweight: Secondary | ICD-10-CM | POA: Diagnosis present

## 2023-11-06 DIAGNOSIS — R0602 Shortness of breath: Secondary | ICD-10-CM | POA: Diagnosis present

## 2023-11-06 DIAGNOSIS — I701 Atherosclerosis of renal artery: Secondary | ICD-10-CM | POA: Diagnosis present

## 2023-11-06 DIAGNOSIS — I120 Hypertensive chronic kidney disease with stage 5 chronic kidney disease or end stage renal disease: Secondary | ICD-10-CM | POA: Diagnosis present

## 2023-11-06 DIAGNOSIS — I2510 Atherosclerotic heart disease of native coronary artery without angina pectoris: Secondary | ICD-10-CM | POA: Diagnosis present

## 2023-11-06 DIAGNOSIS — R188 Other ascites: Secondary | ICD-10-CM | POA: Diagnosis present

## 2023-11-06 DIAGNOSIS — Z79899 Other long term (current) drug therapy: Secondary | ICD-10-CM | POA: Diagnosis not present

## 2023-11-06 DIAGNOSIS — Z66 Do not resuscitate: Secondary | ICD-10-CM | POA: Diagnosis present

## 2023-11-06 DIAGNOSIS — E785 Hyperlipidemia, unspecified: Secondary | ICD-10-CM | POA: Diagnosis present

## 2023-11-06 DIAGNOSIS — Z992 Dependence on renal dialysis: Secondary | ICD-10-CM | POA: Diagnosis not present

## 2023-11-06 DIAGNOSIS — Z681 Body mass index (BMI) 19 or less, adult: Secondary | ICD-10-CM | POA: Diagnosis not present

## 2023-11-06 DIAGNOSIS — Z888 Allergy status to other drugs, medicaments and biological substances status: Secondary | ICD-10-CM | POA: Diagnosis not present

## 2023-11-06 DIAGNOSIS — J449 Chronic obstructive pulmonary disease, unspecified: Secondary | ICD-10-CM | POA: Diagnosis present

## 2023-11-06 DIAGNOSIS — E1122 Type 2 diabetes mellitus with diabetic chronic kidney disease: Secondary | ICD-10-CM | POA: Diagnosis present

## 2023-11-06 LAB — RENAL FUNCTION PANEL
Albumin: 2.6 g/dL — ABNORMAL LOW (ref 3.5–5.0)
Anion gap: 12 (ref 5–15)
BUN: 49 mg/dL — ABNORMAL HIGH (ref 8–23)
CO2: 28 mmol/L (ref 22–32)
Calcium: 8.6 mg/dL — ABNORMAL LOW (ref 8.9–10.3)
Chloride: 98 mmol/L (ref 98–111)
Creatinine, Ser: 8.21 mg/dL — ABNORMAL HIGH (ref 0.61–1.24)
GFR, Estimated: 7 mL/min — ABNORMAL LOW (ref >60)
Glucose, Bld: 104 mg/dL — ABNORMAL HIGH (ref 70–99)
Phosphorus: 6.5 mg/dL — ABNORMAL HIGH (ref 2.5–4.6)
Potassium: 4.2 mmol/L (ref 3.5–5.1)
Sodium: 138 mmol/L (ref 135–145)

## 2023-11-06 LAB — CBC
HCT: 39 % (ref 39.0–52.0)
Hemoglobin: 13.2 g/dL (ref 13.0–17.0)
MCH: 33.3 pg (ref 26.0–34.0)
MCHC: 33.8 g/dL (ref 30.0–36.0)
MCV: 98.5 fL (ref 80.0–100.0)
Platelets: 198 10*3/uL (ref 150–400)
RBC: 3.96 MIL/uL — ABNORMAL LOW (ref 4.22–5.81)
RDW: 13.8 % (ref 11.5–15.5)
WBC: 6.1 10*3/uL (ref 4.0–10.5)
nRBC: 0 % (ref 0.0–0.2)

## 2023-11-06 MED ORDER — IPRATROPIUM-ALBUTEROL 0.5-2.5 (3) MG/3ML IN SOLN
3.0000 mL | Freq: Three times a day (TID) | RESPIRATORY_TRACT | Status: DC
  Administered 2023-11-07 – 2023-11-09 (×8): 3 mL via RESPIRATORY_TRACT
  Filled 2023-11-06 (×8): qty 3

## 2023-11-06 MED ORDER — CHLORHEXIDINE GLUCONATE CLOTH 2 % EX PADS
6.0000 | MEDICATED_PAD | Freq: Every day | CUTANEOUS | Status: DC
Start: 2023-11-07 — End: 2023-11-10
  Administered 2023-11-07 – 2023-11-10 (×4): 6 via TOPICAL

## 2023-11-06 NOTE — Progress Notes (Signed)
 Hemodialysis Note:  Received patient in bed to unit. Alert, with HD needles still attached on patient's AVF. Informed consent singed by two nurses and in chart. Patient is visually impaired.  Treatment initiated: 1337 Treatment completed: 1838  Access used: Left AVF Access issues: Arterial line reinserted and cartridge changed due to blood clot and low pressure  Patient tolerated well. Transported back to room, alert without acute distress. Report given to patient's RN.  Total UF removed: 3 Liters Medications given: None Post HD weight: 65.8 Kg  [facility]

## 2023-11-06 NOTE — Progress Notes (Signed)
 Progress Note   Patient: Todd Brown AOZ:[id number] DOB: 06/01/1961 DOA: 11/05/2023     0 DOS: the patient was seen and examined on 11/06/2023   Brief hospital course: Todd Brown is a 63 y.o. male with medical history significant of ESRD on HD (MWF), schizophrenia, hypertension, hyperlipidemia, COPD with chronic hypoxic respiratory failure on 2 L, CAD, recurrent ascites requiring paracentesis, hypothyroidism, blindness, who presents to the ED due to shortness of breath.   History obtained through chart review due to patient's altered mental status and noncooperation. Per chart review, EMS was called to the dialysis center when patient developed shortness of breath approximately 45 minutes after starting his dialysis.  On their arrival, they noted that he was hypoxic on his home 2 L.  In addition, he was eating and continuously coughing while eating with a weak cough.  On arrival to the ED, he denied any chest pain, nausea, vomiting, abdominal pain.  He endorsed congestion and cough only.   My personal evaluation, patient not wanting to answer questions and resisting physical exam.   ED course: On arrival to the ED, patient was hypertensive at 136/94 with heart rate of 84.  He was saturating at 90% on 2 L.  He was afebrile at 98.1. Initial workup notable for unremarkable CBC and BMP with BUN of 38, creatinine 7.3 and GFR of 8.  COVID-19, influenza and RSV PCR negative.  Chest x-ray with similar round lobular right mid to lower lobe opacity and similar streaky opacities in the left lung.  Patient started on azithromycin , Rocephin , and DuoNebs.  TRH contacted for admission.  Assessment and Plan:   * Acute on chronic hypoxic respiratory failure (HCC) Patient is presenting after developing shortness of breath during dialysis with initial requirement of 8 L prior to being quickly weaned back down to home 2 L.  He is coughing and hacking repeatedly saying.  I suspect that enlarging lung mass is  playing a significant role here given evidence of bronchial narrowing on CT today.  In addition, he is a high aspiration risk and may be repeatedly aspirating while eating, as this is what he was doing when his shortness of breath started. Continue supplemental oxygen Palliative consulted for goals of care - Continue antibiotics for now, although suspect a very low utility in the setting.   Aspiration pneumonitis (HCC) On EMS arrival, patient was noted to be eating and coughing/hacking repeatedly.  On CT, no new findings other than enlarging known lung mass. Continue speech therapy eval   Lung mass On previous admission, CT imaging demonstrated a large right lower lobe with an associated effusion, in addition to a left lower lobe nodule.  Overall, strong suspicion for underlying malignancy, for which patient and his legal guardian have declined further workup.  CT imaging today demonstrates progression.   - As noted above, palliative care consulted   ESRD on hemodialysis [facility]) - Nephrology consulted; appreciate their recommendations - Dialysis per nephrology   COPD (chronic obstructive pulmonary disease) (HCC) - DuoNebs every 6 hours - Continue home bronchodilators   Ascites On semination, it appears that patient's ascites has reaccumulated since his last paracentesis on May 19.  Does not seem to be causing significant discomfort at this time though.   - Consider repeat paracentesis prior to discharge   Hypothyroidism - Continue home Synthroid    CAD (coronary artery disease) No ported chest pain at this time.   - Continue home regimen   Essential hypertension - Continue home regimen  Schizophrenia (HCC) - Continue home regimen   Advance Care Planning:   Code Status: Limited: Do not attempt resuscitation (DNR) -DNR-LIMITED -Do Not Intubate/DNI  per palliative care discussions with patient's legal guardian on 10/14/2023.    Consults: Nephrology   Family Communication: No  family at bedside    Subjective:  Patient appears sick Denies nausea vomiting Respiratory function remains the same on 4 L of oxygen  Physical Exam:  Constitutional:      General: He is not in acute distress.    Appearance: He is normal weight.  HENT:     Head: Normocephalic and atraumatic.  Cardiovascular:     Rate and Rhythm: Normal rate and regular rhythm.  Pulmonary: On oxygen with transmitted sounds bilaterally Abdominal:     General: There is distension.     Palpations: Abdomen is soft.     Tenderness: There is no abdominal tenderness.  Musculoskeletal:     Right lower leg: No edema.     Left lower leg: No edema.  Skin:    General: Skin is warm and dry.  Neurological:     Mental Status: He is alert.     Comments:  Patient is very difficult to understand, a dentulous.  Moving all extremities against gravity  Psychiatric:        Behavior: Behavior is agitated. Vitals:   11/06/23 1400 11/06/23 1430 11/06/23 1500 11/06/23 1530  BP: 106/75 (!) 116/90 114/86 107/73  Pulse: 70 66 66 66  Resp: (!) 26 (!) 23 19 16   Temp:      TempSrc:      SpO2: 99% 99% 98% 99%  Weight:      Height:        Data Reviewed:     Latest Ref Rng & Units 11/06/2023    1:43 PM 11/05/2023   12:48 PM 10/19/2023    3:42 AM  CBC  WBC 4.0 - 10.5 K/uL 6.1  8.3  6.6   Hemoglobin 13.0 - 17.0 g/dL 40.9  81.1  91.4   Hematocrit 39.0 - 52.0 % 39.0  47.3  38.6   Platelets 150 - 400 K/uL 198  212  235        Latest Ref Rng & Units 11/06/2023    1:43 PM 11/05/2023   12:48 PM 10/19/2023    3:42 AM  BMP  Glucose 70 - 99 mg/dL 782  89  90   BUN 8 - 23 mg/dL 49  38  35   Creatinine 0.61 - 1.24 mg/dL 9.56  2.13  0.86   Sodium 135 - 145 mmol/L 138  139  133   Potassium 3.5 - 5.1 mmol/L 4.2  4.1  4.3   Chloride 98 - 111 mmol/L 98  96  97   CO2 22 - 32 mmol/L 28  26  27    Calcium  8.9 - 10.3 mg/dL 8.6  8.8  8.6      Family Communication: None present at bedside  Disposition: Pending clinical  course   Time spent: 57 minutes  Author: Ezzard Holms, MD 11/06/2023 3:43 PM  For on call review [email or web address].

## 2023-11-06 NOTE — Consult Note (Signed)
 Consultation Note Date: 11/06/2023   Patient Name: Todd Brown  DOB: 1960-06-28  MRN: [id number]  Age / Sex: 63 y.o., male  PCP: Janann Meadow, NP Referring Physician: Ezzard Holms, MD  Reason for Consultation: Establishing goals of care   HPI/Brief Hospital Course: 63 y.o. male  with past medical history of end-stage renal disease on HD (MWF), schizophrenia, hypertension, hyperlipidemia, COPD with chronic hypoxic respiratory failure on 2 L nasal cannula at baseline, CAD, recurrent ascites requiring paracentesis, hypothyroidism, blindness admitted from dialysis center on 11/05/2023 with shortness of breath.  On EMS arrival found to be hypoxic and potential aspiration event as he was eating and continuously coughing with a weakened cough.  Admitted and being treated for acute on chronic hypoxic respiratory failure as well as aspiration pneumonitis  Noted on CT imaging this admission progression of the large right lower lobe lung nodule with associated effusion-overall strong suspicion for underlying malignancy, from chart review and previous admission legal guardian has declined further workup  DNR status established last month during palliative visit  Palliative medicine was consulted for assisting with goals of care conversations.  Subjective:  Extensive chart review has been completed prior to meeting patient including labs, vital signs, imaging, progress notes, orders, and available advanced directive documents from current and previous encounters.  Visited with Mr. Winchell at his bedside.  He is awake, alert, oriented to self and place but unclear on time and situation.  Assess symptoms.  He denies acute pain or discomfort.  Abdomen visibly distended and taut on palpitation.  Mr. Electa Grieve unable to participate in goals of care conversations.  Will attempt to contact to [name]-legal guardian 6/8 for updated goals of care conversations with  recommendations to consider transitioning to comfort care/hospice care given the progression of lung nodule on recent CT imaging.  PMT will continue to follow and support patient as needed.  Objective: Primary Diagnoses: Present on Admission:  Acute on chronic hypoxic respiratory failure (HCC)  Aspiration pneumonitis (HCC)  COPD (chronic obstructive pulmonary disease) (HCC)  Lung mass  Essential hypertension  CAD (coronary artery disease)  Hypothyroidism  Schizophrenia (HCC)  Ascites  Acute hypoxic respiratory failure (HCC)   Physical Exam Constitutional:      General: He is not in acute distress.    Appearance: He is ill-appearing.  Pulmonary:     Effort: Pulmonary effort is normal. No respiratory distress.  Abdominal:     General: There is distension.     Tenderness: There is abdominal tenderness.     Comments: Taut  Skin:    General: Skin is warm and dry.  Neurological:     Mental Status: He is alert. He is disoriented.     Motor: Weakness present.     Vital Signs: BP 98/74 (BP Location: Right Arm)   Pulse 69   Temp 98 F (36.7 C) (Oral)   Resp 19   Ht 6\' 3"  (1.905 m)   Wt 68.8 kg   SpO2 98%   BMI 18.96 kg/m  Pain Scale: 0-10   Pain Score: 0-No pain   IO: Intake/output summary: No intake or output data in the 24 hours ending 11/06/23 1820  LBM: Last BM Date : 11/05/23 Baseline Weight: Weight: 74.6 kg Most recent weight: Weight: 68.8 kg       Assessment and Plan  SUMMARY OF RECOMMENDATIONS   Will reach out to legal guardian tomorrow for goals of care conversations  Palliative Prophylaxis:   Bowel Regimen, Delirium Protocol  and Frequent Pain Assessment   Thank you for this consult and allowing Palliative Medicine to participate in the care of Doy L.  Ashkar. Palliative medicine will continue to follow and assist as needed.   Time Total: 40 minutes  Time spent includes: Detailed review of medical records (labs, imaging, vital signs),  medically appropriate exam (mental status, respiratory, cardiac, skin), discussed with treatment team, counseling and educating patient, family and staff, documenting clinical information, medication management and coordination of care.   Signed by: Isadore Marble, DNP, AGNP-C Palliative Medicine    Please contact Palliative Medicine Team phone at [phone] for questions and concerns.  For individual provider: See Tilford Foley

## 2023-11-06 NOTE — Progress Notes (Signed)
 Referring Provider: No ref. provider found Primary Care Physician:  Janann Meadow, NP Primary Nephrologist:  Dr.   Lea Primmer for Consultation: End-stage renal disease  HPI: 63 y.o. male with medical history significant of ESRD on HD (MWF), schizophrenia, hypertension, hyperlipidemia, COPD with chronic hypoxic respiratory failure on 2 L, CAD, recurrent ascites requiring paracentesis, hypothyroidism, blindness, who presents to the ED due to shortness of breath.  As per chart he developed shortness of breath while on dialysis treatment.  The treatment was aborted and he was sent to the emergency room.  He tested negative for influenza and COVID.  He was found to have questionable pneumonia and was started on azithromycin  and Rocephin .  Past Medical History:  Diagnosis Date   Anemia    Arthritis    Chronic respiratory failure (HCC)    COPD (chronic obstructive pulmonary disease) (HCC)    Dialysis patient (HCC)    Mon. 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   ESRD (end stage renal disease) (HCC)    ESRD (end stage renal disease) on dialysis (HCC)    Hyperlipidemia    Hypertension    Malignant hypertension    Pneumonia 2016   Renal artery stenosis (HCC)    Schizophrenia (HCC)     Past Surgical History:  Procedure Laterality Date   A/V FISTULAGRAM N/A 08/06/2016   Procedure: A/V Fistulagram;  Surgeon: Celso College, MD;  Location: [facility];  Service: Cardiovascular;  Laterality: N/A;   A/V FISTULAGRAM N/A 02/14/2021   Procedure: A/V FISTULAGRAM poss Perm Cath Insertion;  Surgeon: Jackquelyn Mass, MD;  Location: [facility];  Service: Cardiovascular;  Laterality: N/A;   A/V FISTULAGRAM Left 08/26/2021   Procedure: A/V Fistulagram;  Surgeon: Celso College, MD;  Location: [facility];  Service: Cardiovascular;  Laterality: Left;   A/V SHUNT INTERVENTION N/A 08/06/2016   Procedure: A/V Shunt Intervention;  Surgeon:  Celso College, MD;  Location: [facility];  Service: Cardiovascular;  Laterality: N/A;   AV FISTULA PLACEMENT Left 12/26/2014   Procedure: ARTERIOVENOUS (AV) FISTULA CREATION;  Surgeon: Celso College, MD;  Location: [facility];  Service: Vascular;  Laterality: Left;   AV FISTULA PLACEMENT     ESOPHAGOGASTRODUODENOSCOPY (EGD) WITH PROPOFOL  N/A 05/06/2017   Procedure: ESOPHAGOGASTRODUODENOSCOPY (EGD) WITH PROPOFOL ;  Surgeon: Luke Salaam, MD;  Location: [facility];  Service: Gastroenterology;  Laterality: N/A;   INSERTION OF DIALYSIS CATHETER Right    PERIPHERAL VASCULAR CATHETERIZATION N/A 10/22/2014   Procedure: Dialysis/Perma Catheter Insertion;  Surgeon: Celso College, MD;  Location: [facility];  Service: Cardiovascular;  Laterality: N/A;   PERIPHERAL VASCULAR CATHETERIZATION N/A 02/28/2015   Procedure: Dialysis/Perma Catheter Removal;  Surgeon: Celso College, MD;  Location: [facility];  Service: Cardiovascular;  Laterality: N/A;   Repair fx left lower leg     yrs ago (age 59)   REVISON OF ARTERIOVENOUS FISTULA Left 03/27/2022   Procedure: REVISON OF ARTERIOVENOUS FISTULA (JUMP GRAFT);  Surgeon: Jackquelyn Mass, MD;  Location: [facility];  Service: Vascular;  Laterality: Left;   TEE WITHOUT CARDIOVERSION N/A 09/21/2022   Procedure: TRANSESOPHAGEAL ECHOCARDIOGRAM;  Surgeon: Constancia Delton, MD;  Location: [facility];  Service: Cardiovascular;  Laterality: N/A;   TONSILLECTOMY      Prior to Admission medications   Medication Sig Start Date End Date Taking? Authorizing Provider  albuterol  (VENTOLIN  HFA) 108 (90 Base) MCG/ACT  inhaler Inhale 1-2 puffs into the lungs every 4 (four) hours as needed for wheezing or shortness of breath.   Yes [provider]  ascorbic acid  (VITAMIN C ) 500 MG tablet Take 500 mg by mouth daily.   Yes [provider]  b complex-vitamin c -folic acid  (NEPHRO-VITE) 0.8 MG TABS tablet Take 1 tablet by mouth at bedtime.   Yes  [provider]  benzonatate  (TESSALON ) 100 MG capsule Take 100 mg by mouth every 6 (six) hours as needed. 09/28/23  Yes [provider]  carbamazepine  (TEGRETOL ) 200 MG tablet Take 1,000 mg by mouth every morning.   Yes [provider]  cholecalciferol  (VITAMIN D ) 1000 UNITS tablet Take 1,000 Units by mouth in the morning.   Yes [provider]  ferrous sulfate  325 (65 FE) MG tablet Take 1 tablet (325 mg total) by mouth daily. 05/19/15  Yes Patel, Sona, MD  Fluticasone -Salmeterol (ADVAIR) 250-50 MCG/DOSE AEPB Inhale 1 puff into the lungs 2 (two) times daily.   Yes [provider]  hyoscyamine  (LEVSIN SL) 0.125 MG SL tablet Place 0.125 mg under the tongue every 4 (four) hours as needed for cramping.   Yes [provider]  ipratropium-albuterol  (DUONEB) 0.5-2.5 (3) MG/3ML SOLN Take 3 mLs by nebulization every 6 (six) hours as needed. 05/04/23  Yes Alexander, Natalie, DO  isosorbide  mononitrate (IMDUR ) 60 MG 24 hr tablet Take 1 tablet (60 mg total) by mouth daily. 06/19/23  Yes Brenna Cam, MD  levothyroxine  (SYNTHROID ) 125 MCG tablet Take 1 tablet (125 mcg total) by mouth daily. 11/08/22 11/05/23 Yes Wieting, Richard, MD  LORazepam  (ATIVAN ) 1 MG tablet Take 1 mg by mouth 3 (three) times daily. 10/14/23  Yes [provider]  nicotine  (NICODERM CQ  - DOSED IN MG/24 HOURS) 21 mg/24hr patch Place 1 patch (21 mg total) onto the skin daily. 05/05/23  Yes Alexander, Natalie, DO  omeprazole  (PRILOSEC) 40 MG capsule Take 40 mg by mouth every morning.   Yes [provider]  polyethylene glycol (MIRALAX  / GLYCOLAX ) 17 g packet Take 17 g by mouth daily as needed for mild constipation.   Yes [provider]  rosuvastatin  (CRESTOR ) 10 MG tablet Take 10 mg by mouth at bedtime. 04/19/23  Yes [provider]  senna-docusate (SENOKOT-S) 8.6-50 MG tablet Take 1 tablet by mouth 2 (two) times daily as needed for mild constipation.   Yes  [provider]  traMADol  (ULTRAM ) 50 MG tablet Take 50 mg by mouth every 6 (six) hours as needed for moderate pain (pain score 4-6). 06/23/23  Yes [provider]  trihexyphenidyl  (ARTANE ) 2 MG tablet Take 2 mg by mouth 2 (two) times daily.   Yes [provider]  zinc  sulfate 220 (50 Zn) MG capsule Take 220 mg by mouth daily.   Yes [provider]    Current Facility-Administered Medications  Medication Dose Route Frequency Provider Last Rate Last Admin   acetaminophen  (TYLENOL ) tablet 650 mg  650 mg Oral Q6H PRN Avi Body, MD       Or   acetaminophen  (TYLENOL ) suppository 650 mg  650 mg Rectal Q6H PRN Avi Body, MD       Ampicillin -Sulbactam (UNASYN ) 3 g in sodium chloride  0.9 % 100 mL IVPB  3 g Intravenous Q12H Madelynn Schilder, RPH 200 mL/hr at 11/06/23 0555 3 g at 11/06/23 0555   carbamazepine  (TEGRETOL ) tablet 1,000 mg  1,000 mg Oral q morning Avi Body, MD   1,000 mg at 11/06/23 1610   [  START ON 11/07/2023] Chlorhexidine  Gluconate Cloth 2 % PADS 6 each  6 each Topical Q0600 Worthy Heads, MD       ferrous sulfate  tablet 325 mg  325 mg Oral Q supper Avi Body, MD       fluticasone  furoate-vilanterol (BREO ELLIPTA ) 200-25 MCG/ACT 1 puff  1 puff Inhalation Daily Avi Body, MD   1 puff at 11/06/23 4098   heparin  injection 5,000 Units  5,000 Units Subcutaneous Q8H Avi Body, MD   5,000 Units at 11/06/23 0604   ipratropium-albuterol  (DUONEB) 0.5-2.5 (3) MG/3ML nebulizer solution 3 mL  3 mL Nebulization TID Ezzard Holms, MD       isosorbide  mononitrate (IMDUR ) 24 hr tablet 60 mg  60 mg Oral Daily Avi Body, MD   60 mg at 11/06/23 0831   levothyroxine  (SYNTHROID ) tablet 125 mcg  125 mcg Oral Q0600 Avi Body, MD   125 mcg at 11/06/23 0601   LORazepam  (ATIVAN ) tablet 1 mg  1 mg Oral TID PRN Avi Body, MD       ondansetron  (ZOFRAN ) tablet 4 mg  4 mg Oral Q6H PRN Avi Body, MD       Or   ondansetron   (ZOFRAN ) injection 4 mg  4 mg Intravenous Q6H PRN Avi Body, MD       pantoprazole  (PROTONIX ) EC tablet 40 mg  40 mg Oral Daily Avi Body, MD   40 mg at 11/06/23 0831   polyethylene glycol (MIRALAX  / GLYCOLAX ) packet 17 g  17 g Oral Daily PRN Avi Body, MD       rosuvastatin  (CRESTOR ) tablet 10 mg  10 mg Oral QHS Avi Body, MD       sodium chloride  flush (NS) 0.9 % injection 3 mL  3 mL Intravenous Q12H Avi Body, MD   3 mL at 11/06/23 1191   trihexyphenidyl  (ARTANE ) tablet 2 mg  2 mg Oral BID Basaraba, Iulia, MD   2 mg at 11/06/23 0831    Allergies as of 11/05/2023 - Review Complete 11/05/2023  Allergen Reaction Noted   Chlorpromazine Other (See Comments) 03/23/2013    Family History  Problem Relation Age of Onset   Kidney disease Brother    Diabetes Neg Hx     Social History   Socioeconomic History   Marital status: Single    Spouse name: Not on file   Number of children: 0   Years of education: Not on file   Highest education level: Not on file  Occupational History   Occupation: Disability   Tobacco Use   Smoking status: Never    Passive exposure: Never   Smokeless tobacco: Never  Vaping Use   Vaping status: Never Used  Substance and Sexual Activity   Alcohol use: Not Currently   Drug use: Not Currently    Types: Marijuana    Comment: not since living at assisted living place 03/2020   Sexual activity: Not Currently  Other Topics Concern   Not on file  Social History Narrative   ** Merged History Encounter **       Living at [facility] [place], [place]  [place] [employer] is legal guardian    Social Drivers of Health   Financial Resource Strain: Low Risk  (10/08/2018)   Overall Financial Resource Strain (CARDIA)    Difficulty of Paying Living Expenses: Not hard at all  Food Insecurity: Patient Declined (11/06/2023)   Hunger Vital Sign    Worried About Running Out of Food in the  Last Year: Patient  declined    Ran Out of Food in the Last Year: Patient declined  Transportation Needs: Patient Declined (11/06/2023)   PRAPARE - Administrator, Civil Service (Medical): Patient declined    Lack of Transportation (Non-Medical): Patient declined  Recent Concern: Transportation Needs - Unmet Transportation Needs (10/13/2023)   PRAPARE - Transportation    Lack of Transportation (Medical): Yes    Lack of Transportation (Non-Medical): Patient unable to answer  Physical Activity: Unknown (10/08/2018)   Exercise Vital Sign    Days of Exercise per Week: Patient declined    Minutes of Exercise per Session: Patient declined  Stress: No Stress Concern Present (10/08/2018)   [employer] of Occupational Health - Occupational Stress Questionnaire    Feeling of Stress : Not at all  Social Connections: Unknown (10/08/2018)   Social Connection and Isolation Panel [NHANES]    Frequency of Communication with Friends and Family: Patient declined    Frequency of Social Gatherings with Friends and Family: Patient declined    Attends Religious Services: Patient declined    Active Member of Clubs or Organizations: Patient declined    Attends Banker Meetings: Patient declined    Marital Status: Patient declined  Intimate Partner Violence: Patient Declined (11/06/2023)   Humiliation, Afraid, Rape, and Kick questionnaire    Fear of Current or Ex-Partner: Patient declined    Emotionally Abused: Patient declined    Physically Abused: Patient declined    Sexually Abused: Patient declined    Physical Exam: Vital signs in last 24 hours: Temp:  [98 F (36.7 C)-99.2 F (37.3 C)] 98 F (36.7 C) (06/07 1323) Pulse Rate:  [67-79] 72 (06/07 1337) Resp:  [16-22] 21 (06/07 1337) BP: (104-147)/(75-92) 104/75 (06/07 1337) SpO2:  [91 %-100 %] 96 % (06/07 1337) Weight:  [68.8 kg] 68.8 kg (06/07 1323) Last BM Date : 11/05/23 General:   Alert,  Well-developed, well-nourished, pleasant and  cooperative in NAD Head:  Normocephalic and atraumatic. Eyes:  Sclera clear, no icterus.   Conjunctiva pink. Ears:  Normal auditory acuity. Nose:  No deformity, discharge,  or lesions. Lungs:  Clear throughout to auscultation.   No wheezes, crackles, or rhonchi. No acute distress. Heart:  Regular rate and rhythm; no murmurs, clicks, rubs,  or gallops. Abdomen:  Soft, nontender and nondistended. No masses, hepatosplenomegaly or hernias noted. Normal bowel sounds, without guarding, and without rebound.   Extremities:  Without clubbing or edema.  Intake/Output from previous day: No intake/output data recorded. Intake/Output this shift: No intake/output data recorded.  Lab Results: Recent Labs    11/05/23 1248 11/06/23 1343  WBC 8.3 6.1  HGB 15.7 13.2  HCT 47.3 39.0  PLT 212 198   BMET Recent Labs    11/05/23 1248  NA 139  K 4.1  CL 96*  CO2 26  GLUCOSE 89  BUN 38*  CREATININE 7.38*  CALCIUM  8.8*   LFT No results for input(s): "PROT", "ALBUMIN ", "AST", "ALT", "ALKPHOS", "BILITOT", "BILIDIR", "IBILI" in the last 72 hours. PT/INR No results for input(s): "LABPROT", "INR" in the last 72 hours. Hepatitis Panel No results for input(s): "HEPBSAG", "HCVAB", "HEPAIGM", "HEPBIGM" in the last 72 hours.  Studies/Results: CT Chest Wo Contrast Result Date: 11/05/2023 CLINICAL DATA:  Shortness of breath and hypoxia. Possible aspiration with pneumonia not excluded on a portable chest radiograph earlier today. There was a continued mass-like opacity in the right lung suspicious for underlying malignancy. EXAM: CT CHEST WITHOUT CONTRAST TECHNIQUE: Multidetector CT  imaging of the chest was performed following the standard protocol without IV contrast. RADIATION DOSE REDUCTION: This exam was performed according to the departmental dose-optimization program which includes automated exposure control, adjustment of the mA and/or kV according to patient size and/or use of iterative reconstruction  technique. COMPARISON:  Chest CT dated 10/13/2023. Portable chest radiographs obtained earlier today and on 10/13/2023. FINDINGS: Cardiovascular: Mild atheromatous calcifications, including the coronary arteries and aorta. Stable mildly enlarged heart. Slight increase in size of a small pericardial effusion with a maximum thickness of 6 mm. The central pulmonary arteries remain enlarged with a main pulmonary artery diameter of 3.9 cm. Mediastinum/Nodes: Enlarged right paratracheal lymph nodes are again demonstrated. A previously demonstrated index 12 mm short axis node has a short axis diameter of 10 mm today. Unremarkable thyroid  gland, trachea and esophagus. A small hiatal hernia is again noted. Lungs/Pleura: Again demonstrated is dense consolidation in the lower lobes with air bronchograms and a more confluent area of density in the right lower lobe suggesting a mass. This includes a hilar component encasing the proximal right upper lobe, right lower lobe and bronchus intermedius with diffuse narrowing of those bronchi, without significant change. The combined hilar and right lower lobe mass-like component currently measures 10.9 x 7.9 cm on image number 86/2, previously 9.5 x 6.8 cm. The previously demonstrated 12 mm right lower lobe nodule currently measures 13 mm in maximum diameter on image number 81/4. No new nodules seen. Stable mild changes of centrilobular emphysema throughout both lungs. Moderate-sized pleural effusion with probable partial loculations has increased mildly in size. No significant change in a small left pleural effusion. Upper Abdomen: A moderate to large amount of ascites is again demonstrated with enlarged lateral segment left lobe and caudate lobes of the liver suggesting possible cirrhosis of the liver. The spleen remains normal in size. Both kidneys remain small and contain multiple incompletely evaluated cysts not needing specific imaging follow-up at this time as well as a 2 mm  calculus in each kidney. Musculoskeletal: Mild thoracic and moderate lower cervical spine degenerative changes. Stable healed mid sternal fracture. IMPRESSION: 1. Stable dense consolidation in the lower lobes with air bronchograms and a more confluent area of density in the right lower lobe suggesting a mass. This includes a hilar component encasing the proximal right upper lobe, right lower lobe and bronchus intermedius with diffuse narrowing of those bronchi. The combined hilar and right lower lobe mass-like component has increased mildly in size, currently measuring 10.9 x 7.9 cm. 2. Mild increase in size of a moderate-sized right pleural effusion with probable partial loculations. 3. No significant change in a small left pleural effusion. 4. Slight increase in size of a small pericardial effusion. 5. Stable to slightly improved mild right paratracheal adenopathy. This could be metastatic or reactive in nature. 6. Stable mild changes of centrilobular emphysema throughout both lungs. 7. Stable enlargement of the central pulmonary arteries, suggesting pulmonary arterial hypertension. 8. Stable moderate to large amount of ascites in the upper abdomen with liver findings suggesting possible cirrhosis of the liver. 9. Stable bilateral renal atrophy with multiple incompletely evaluated cysts not needing specific imaging follow-up at this time as well as a 2 mm calculus in each kidney. 10. Stable small hiatal hernia. Aortic Atherosclerosis (ICD10-I70.0) and Emphysema (ICD10-J43.9). Electronically Signed   By: Catherin Closs M.D.   On: 11/05/2023 16:01   DG Chest Portable 1 View Result Date: 11/05/2023 CLINICAL DATA:  Shortness of breath. EXAM: PORTABLE CHEST 1 VIEW  COMPARISON:  10/13/2023. FINDINGS: Stable cardiomediastinal silhouette. Similar round lobular right mid to lower lobe opacity, corresponding to area of masslike opacity noted in the right lung on the prior CT chest dated 10/13/2023. Persistent right basilar  atelectasis with small right effusion. Similar streaky opacities in the left lung with left lower lobe nodular appearing opacity, which may correspond to the previously noted left lower lobe nodule on the prior CT chest 10/13/2023. No pneumothorax. No acute osseous abnormality. IMPRESSION: 1. Similar round lobular right mid to lower lobe opacity, corresponding to area of reported masslike opacity in the right lung on the prior CT chest dated 10/13/2023, concerning for underlying malignancy. Superimposed infiltrate or edema cannot be excluded. 2. Similar streaky opacities in the left lung, which could reflect infiltrate or edema, with left lower lobe nodular appearing opacity, which may correspond to the previously noted left lower lobe nodule on the prior CT chest. Electronically Signed   By: Mannie Seek M.D.   On: 11/05/2023 13:17    Assessment/Plan:  63 y.o. male with medical history significant of ESRD on HD (MWF), schizophrenia, hypertension, hyperlipidemia, COPD with chronic hypoxic respiratory failure on 2 L, CAD, recurrent ascites requiring paracentesis, hypothyroidism, blindness, who presents to the ED due to shortness of breath.  As per chart he developed shortness of breath while on dialysis treatment.  The treatment was aborted and he was sent to the emergency room.  He tested negative for influenza and COVID.  He was found to have questionable pneumonia and was started on azithromycin  and Rocephin .  ESRD: Will dialyze today with fluid removal.  ANEMIA: Continue anemia protocol with erythropoietin  and iron .  MBD: Check PTH, calcium  and phosphorus levels.  HTN/VOL: Blood pressure is marginal but stable.  Shortness of breath/pneumonia: Continue inhalers along with azithromycin  and Rocephin .  Labs and medications reviewed. Will continue to monitor closely.    LOS: 0 Worthy Heads, MD [facility] @TODAY @1 :55 PM

## 2023-11-06 NOTE — Evaluation (Signed)
 Clinical/Bedside Swallow Evaluation Patient Details  Name: Todd Brown MRN: [id number] Date of Birth: Sep 19, 1960  Today's Date: 11/06/2023 Time: SLP Start Time (ACUTE ONLY): 1025 SLP Stop Time (ACUTE ONLY): 1044 SLP Time Calculation (min) (ACUTE ONLY): 19 min  Past Medical History:  Past Medical History:  Diagnosis Date   Anemia    Arthritis    Chronic respiratory failure (HCC)    COPD (chronic obstructive pulmonary disease) (HCC)    Dialysis patient (HCC)    Mon. 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   ESRD (end stage renal disease) (HCC)    ESRD (end stage renal disease) on dialysis (HCC)    Hyperlipidemia    Hypertension    Malignant hypertension    Pneumonia 2016   Renal artery stenosis (HCC)    Schizophrenia (HCC)    Past Surgical History:  Past Surgical History:  Procedure Laterality Date   A/V FISTULAGRAM N/A 08/06/2016   Procedure: A/V Fistulagram;  Surgeon: Celso College, MD;  Location: [facility];  Service: Cardiovascular;  Laterality: N/A;   A/V FISTULAGRAM N/A 02/14/2021   Procedure: A/V FISTULAGRAM poss Perm Cath Insertion;  Surgeon: Jackquelyn Mass, MD;  Location: [facility];  Service: Cardiovascular;  Laterality: N/A;   A/V FISTULAGRAM Left 08/26/2021   Procedure: A/V Fistulagram;  Surgeon: Celso College, MD;  Location: [facility];  Service: Cardiovascular;  Laterality: Left;   A/V SHUNT INTERVENTION N/A 08/06/2016   Procedure: A/V Shunt Intervention;  Surgeon: Celso College, MD;  Location: [facility];  Service: Cardiovascular;  Laterality: N/A;   AV FISTULA PLACEMENT Left 12/26/2014   Procedure: ARTERIOVENOUS (AV) FISTULA CREATION;  Surgeon: Celso College, MD;  Location: [facility];  Service: Vascular;  Laterality: Left;   AV FISTULA PLACEMENT     ESOPHAGOGASTRODUODENOSCOPY (EGD) WITH PROPOFOL  N/A 05/06/2017   Procedure: ESOPHAGOGASTRODUODENOSCOPY (EGD) WITH PROPOFOL ;   Surgeon: Luke Salaam, MD;  Location: [facility];  Service: Gastroenterology;  Laterality: N/A;   INSERTION OF DIALYSIS CATHETER Right    PERIPHERAL VASCULAR CATHETERIZATION N/A 10/22/2014   Procedure: Dialysis/Perma Catheter Insertion;  Surgeon: Celso College, MD;  Location: [facility];  Service: Cardiovascular;  Laterality: N/A;   PERIPHERAL VASCULAR CATHETERIZATION N/A 02/28/2015   Procedure: Dialysis/Perma Catheter Removal;  Surgeon: Celso College, MD;  Location: [facility];  Service: Cardiovascular;  Laterality: N/A;   Repair fx left lower leg     yrs ago (age 42)   REVISON OF ARTERIOVENOUS FISTULA Left 03/27/2022   Procedure: REVISON OF ARTERIOVENOUS FISTULA (JUMP GRAFT);  Surgeon: Jackquelyn Mass, MD;  Location: [facility];  Service: Vascular;  Laterality: Left;   TEE WITHOUT CARDIOVERSION N/A 09/21/2022   Procedure: TRANSESOPHAGEAL ECHOCARDIOGRAM;  Surgeon: Constancia Delton, MD;  Location: [facility];  Service: Cardiovascular;  Laterality: N/A;   TONSILLECTOMY     HPI:  Todd Brown is a 63 y.o. male with medical history significant of ESRD on HD (MWF), schizophrenia, hypertension, hyperlipidemia, COPD with chronic hypoxic respiratory failure on 2 L, CAD, recurrent ascites requiring paracentesis, hypothyroidism, blindness, who presents to the ED due to shortness of breath.     History obtained through chart review due to patient's altered mental status and noncooperation. Per chart review, EMS was called to the dialysis center when patient developed shortness of breath approximately 45 minutes after starting his dialysis.  On their arrival, they noted  that he was hypoxic on his home 2 L.  In addition, he was eating and continuously coughing while eating with a weak cough.  On arrival to the ED, he denied any chest pain, nausea, vomiting, abdominal pain.  He endorsed congestion and cough only.Chest CT 11/05/23: Stable dense consolidation in the lower lobes with air   bronchograms and a more confluent area of density in the right lower  lobe suggesting a mass. This includes a hilar component encasing the  proximal right upper lobe, right lower lobe and bronchus intermedius  with diffuse narrowing of those bronchi. The combined hilar and  right lower lobe mass-like component has increased mildly in size,  currently measuring 10.9 x 7.9 cm.  2. Mild increase in size of a moderate-sized right pleural effusion  with probable partial loculations.  3. No significant change in a small left pleural effusion.  4. Slight increase in size of a small pericardial effusion.  5. Stable to slightly improved mild right paratracheal adenopathy.  This could be metastatic or reactive in nature.  6. Stable mild changes of centrilobular emphysema throughout both  lungs.  7. Stable enlargement of the central pulmonary arteries, suggesting  pulmonary arterial hypertension.  8. Stable moderate to large amount of ascites in the upper abdomen  with liver findings suggesting possible cirrhosis of the liver.  9. Stable bilateral renal atrophy with multiple incompletely  evaluated cysts not needing specific imaging follow-up at this time  as well as a 2 mm calculus in each kidney.  10. Stable small hiatal hernia.    Assessment / Plan / Recommendation  Clinical Impression  Pt seen for bedside swallow assessment in the setting of concern for aspiration PNA. Pt last seen for bedside swallow assessment on 10/15/23, with results grossly similar to today's presentation. Per chart review/previous assessment oral tics (lingual movements and "hack" cough) are consistent with pt's baseline. Per H&P, "On their arrival, they noted that he was hypoxic on his home 2 L. In addition, he was eating and continuously coughing while eating with a weak cough."  Today, pt on 2L nasal canula (consistent with baseline), alert, with O2 saturations maintained at greater than 95. Prior to initiation of PO, pt with hacking cough,  leading to gag like response given prolonged nature of cough. Not repeated once PO trials began. Pt seen with trials of thin liquids (via straw), puree, and mech soft consistent solids. Tactile cues provided to aid pt holding cup for increased awareness/engagement of task- total assist provided for use of spoon. No overt or subtle s/sx pharyngeal dysphagia noted. No change to vocal quality across trials. Vitals stable for duration of trials. Oral phase mildly prolonged for mastication and clearance (secondary to baseline edentulous nature).  Based on age and current debility, dependency with intake (related to blindness), pulmonary status (refer to Chest CT), and baseline respiratory status, pt is at increased risk of aspiration, therefore, recommend STRICT aspiration precautions (slow rate, small bites, elevated HOB, reduce distractions, and alert for PO intake). Precautions posted in room. Assistance for PO intake. Recommend dys 2, thins with meds whole vs crushed in puree. MD and RN aware of recommendations. Suspect that pt is approximating baseline, no acute SLP services indicated.   SLP Visit Diagnosis: Dysphagia, oral phase (R13.11) (impacted by lingual/oral movements and visual impairment)    Aspiration Risk  Mild aspiration risk    Diet Recommendation   Thin;Dysphagia 2 (chopped)  Medication Administration: Whole meds with puree (vs crushed)  Other  Recommendations Oral Care Recommendations: Oral care BID;Staff/trained caregiver to provide oral care     Assistance Recommended at Discharge  Assistance with PO intake  Functional Status Assessment Patient has not had a recent decline in their functional status         Prognosis Prognosis for improved oropharyngeal function: Good Barriers/Prognosis Comment: Baseline lingual rolling and Edentulous status; Blindness; needs feeding support at meals      Swallow Study   General Date of Onset: 11/06/23 HPI: Todd Brown is a 63 y.o.  male with medical history significant of ESRD on HD (MWF), schizophrenia, hypertension, hyperlipidemia, COPD with chronic hypoxic respiratory failure on 2 L, CAD, recurrent ascites requiring paracentesis, hypothyroidism, blindness, who presents to the ED due to shortness of breath.     History obtained through chart review due to patient's altered mental status and noncooperation. Per chart review, EMS was called to the dialysis center when patient developed shortness of breath approximately 45 minutes after starting his dialysis.  On their arrival, they noted that he was hypoxic on his home 2 L.  In addition, he was eating and continuously coughing while eating with a weak cough.  On arrival to the ED, he denied any chest pain, nausea, vomiting, abdominal pain.  He endorsed congestion and cough only.Chest CT 11/05/23: Stable dense consolidation in the lower lobes with air  bronchograms and a more confluent area of density in the right lower  lobe suggesting a mass. This includes a hilar component encasing the  proximal right upper lobe, right lower lobe and bronchus intermedius  with diffuse narrowing of those bronchi. The combined hilar and  right lower lobe mass-like component has increased mildly in size,  currently measuring 10.9 x 7.9 cm.  2. Mild increase in size of a moderate-sized right pleural effusion  with probable partial loculations.  3. No significant change in a small left pleural effusion.  4. Slight increase in size of a small pericardial effusion.  5. Stable to slightly improved mild right paratracheal adenopathy.  This could be metastatic or reactive in nature.  6. Stable mild changes of centrilobular emphysema throughout both  lungs.  7. Stable enlargement of the central pulmonary arteries, suggesting  pulmonary arterial hypertension.  8. Stable moderate to large amount of ascites in the upper abdomen  with liver findings suggesting possible cirrhosis of the liver.  9. Stable bilateral renal  atrophy with multiple incompletely  evaluated cysts not needing specific imaging follow-up at this time  as well as a 2 mm calculus in each kidney.  10. Stable small hiatal hernia. Type of Study: Bedside Swallow Evaluation Previous Swallow Assessment: 10/15/23- recommended mech soft and thin liquids Diet Prior to this Study: Dysphagia 1 (pureed);Thin liquids (Level 0) Temperature Spikes Noted: No (WBC 8.3) Respiratory Status: Nasal cannula (2L- consistent with baseline) History of Recent Intubation: No Behavior/Cognition: Alert;Cooperative;Pleasant mood;Distractible;Requires cueing Oral Cavity Assessment: Within Functional Limits Oral Care Completed by SLP: Yes Oral Cavity - Dentition: Edentulous Vision: Impaired for self-feeding ("blindness") Self-Feeding Abilities: Needs assist Patient Positioning: Upright in bed Baseline Vocal Quality: Normal Volitional Cough: Strong Volitional Swallow: Able to elicit    Oral/Motor/Sensory Function Overall Oral Motor/Sensory Function:  (WFL- irregular lingual/oral movements, consistent with baseline)   Ice Chips Ice chips: Not tested   Thin Liquid Thin Liquid: Within functional limits Presentation: Straw;Self Fed (pt held cup)    Nectar Thick Nectar Thick Liquid: Not tested   Honey Thick Honey Thick Liquid: Not tested  Puree Puree: Within functional limits Presentation: Spoon (with assist)   Solid     Solid: Impaired Presentation: Spoon Oral Phase Impairments: Impaired mastication Oral Phase Functional Implications: Impaired mastication Pharyngeal Phase Impairments:  (none) Other Comments: mech soft consistent food     [place] Divya Munshi Clapp, MS, CCC-SLP Speech Language Pathologist Rehab Services; [facility] - [place] [phone] (ascom)   [place] J Clapp 11/06/2023,12:21 PM

## 2023-11-07 DIAGNOSIS — Z515 Encounter for palliative care: Secondary | ICD-10-CM | POA: Diagnosis not present

## 2023-11-07 DIAGNOSIS — J690 Pneumonitis due to inhalation of food and vomit: Secondary | ICD-10-CM | POA: Diagnosis not present

## 2023-11-07 DIAGNOSIS — J9621 Acute and chronic respiratory failure with hypoxia: Secondary | ICD-10-CM | POA: Diagnosis not present

## 2023-11-07 DIAGNOSIS — R188 Other ascites: Secondary | ICD-10-CM | POA: Diagnosis not present

## 2023-11-07 LAB — CBC WITH DIFFERENTIAL/PLATELET
Abs Immature Granulocytes: 0.02 10*3/uL (ref 0.00–0.07)
Basophils Absolute: 0 10*3/uL (ref 0.0–0.1)
Basophils Relative: 0 %
Eosinophils Absolute: 0.1 10*3/uL (ref 0.0–0.5)
Eosinophils Relative: 2 %
HCT: 39.5 % (ref 39.0–52.0)
Hemoglobin: 13 g/dL (ref 13.0–17.0)
Immature Granulocytes: 0 %
Lymphocytes Relative: 11 %
Lymphs Abs: 0.6 10*3/uL — ABNORMAL LOW (ref 0.7–4.0)
MCH: 32.2 pg (ref 26.0–34.0)
MCHC: 32.9 g/dL (ref 30.0–36.0)
MCV: 97.8 fL (ref 80.0–100.0)
Monocytes Absolute: 0.5 10*3/uL (ref 0.1–1.0)
Monocytes Relative: 10 %
Neutro Abs: 3.8 10*3/uL (ref 1.7–7.7)
Neutrophils Relative %: 77 %
Platelets: 181 10*3/uL (ref 150–400)
RBC: 4.04 MIL/uL — ABNORMAL LOW (ref 4.22–5.81)
RDW: 13.8 % (ref 11.5–15.5)
WBC: 5 10*3/uL (ref 4.0–10.5)
nRBC: 0 % (ref 0.0–0.2)

## 2023-11-07 LAB — BASIC METABOLIC PANEL WITH GFR
Anion gap: 12 (ref 5–15)
BUN: 35 mg/dL — ABNORMAL HIGH (ref 8–23)
CO2: 26 mmol/L (ref 22–32)
Calcium: 8.5 mg/dL — ABNORMAL LOW (ref 8.9–10.3)
Chloride: 97 mmol/L — ABNORMAL LOW (ref 98–111)
Creatinine, Ser: 6.09 mg/dL — ABNORMAL HIGH (ref 0.61–1.24)
GFR, Estimated: 10 mL/min — ABNORMAL LOW (ref >60)
Glucose, Bld: 100 mg/dL — ABNORMAL HIGH (ref 70–99)
Potassium: 4.3 mmol/L (ref 3.5–5.1)
Sodium: 135 mmol/L (ref 135–145)

## 2023-11-07 LAB — HEPATITIS B SURFACE ANTIGEN: Hepatitis B Surface Ag: NONREACTIVE

## 2023-11-07 NOTE — TOC Initial Note (Signed)
 Transition of Care [facility]) - Initial/Assessment Note    Patient Details  Name: Todd Brown MRN: [id number] Date of Birth: 03-22-1961  Transition of Care [facility]) CM/SW Contact:    Alexandra Ice, RN Phone Number: 11/07/2023, 9:22 AM  Clinical Narrative:                 Patient resides at [facility]. He has legal guardian, Candice Cobble. He goes to hemodialysis, MWF. Palliative care consulted for goals of care. TOC will continue to follow.    Admission diagnosis:  Shortness of breath [R06.02] Acute hypoxic respiratory failure (HCC) [J96.01] Acute on chronic hypoxic respiratory failure (HCC) [J96.21] Patient Active Problem List   Diagnosis Date Noted   Acute hypoxic respiratory failure (HCC) 11/06/2023   Acute on chronic hypoxic respiratory failure (HCC) 11/05/2023   Dyspnea 10/14/2023   Postobstructive pneumonia 10/13/2023   Lung mass 10/13/2023   HLD (hyperlipidemia) 10/13/2023   Abdominal pain 10/13/2023   Ascites 10/13/2023   Nonspecific chest pain 07/07/2023   Influenza A 06/16/2023   Acute hypoxemic respiratory failure (HCC) 04/30/2023   Blindness of both eyes 04/30/2023   Elevated troponin 04/30/2023   Low back pain 04/30/2023   Productive cough 04/30/2023   Myocardial injury 11/03/2022   CAD (coronary artery disease) 11/03/2022   Hypothyroidism 11/03/2022   Thrombocytopenia (HCC) 11/03/2022   Aspiration pneumonitis (HCC) 11/03/2022   Abdominal distension 11/03/2022   HTN (hypertension) 11/03/2022   Pericardial effusion 11/03/2022   Community acquired pneumonia 09/14/2022   Anasarca (mild pulmonary edema, pleural and pericardial effusion, ascites on CT) 09/14/2022   Acute on chronic respiratory failure (HCC) 09/14/2022   Bacteremia 06/20/2022   Acute encephalopathy 06/19/2022   SIRS (systemic inflammatory response syndrome) (HCC) 06/19/2022   NSTEMI (non-ST elevated myocardial infarction) (HCC) 06/19/2022   Acute on chronic respiratory failure with  hypoxia (HCC) 05/21/2022   Hypoxia 05/20/2022   COVID-19 virus infection 05/20/2022   Hyponatremia 02/10/2022   Anemia due to chronic kidney disease 01/21/2022   Hyperkalemia 01/14/2022   Malnutrition of moderate degree 12/24/2021   Oral thrush 12/22/2021   Physical deconditioning 12/20/2021   Acute metabolic encephalopathy 12/15/2021   Essential hypertension 12/15/2021   ESRD on hemodialysis (HCC) 12/15/2021   Altered mental status 10/08/2021   Hypertensive urgency 10/07/2021   COPD (chronic obstructive pulmonary disease) (HCC) 02/26/2021   GERD (gastroesophageal reflux disease) 02/26/2021   Hypertension 11/28/2020   Acute respiratory failure with hypoxia (HCC) 10/23/2019   Anemia of chronic disease 10/23/2019   Fluid overload 10/08/2018   Acute pulmonary edema (HCC) 09/12/2018   Scrotal edema 05/10/2017   Symptomatic anemia 04/30/2017   Anticoagulated on Coumadin  09/14/2016   Cardiac arrest (HCC) 08/03/2016   Severe sepsis versus SIRS 05/26/2016   Dialysis patient (HCC) 04/15/2016   ESRD needing dialysis (HCC) 02/12/2016   Hyperkalemia 05/20/2015   Hepatitis C 09/26/2013   Schizophrenia (HCC) 03/23/2013   Essential hypertension, benign 03/23/2013   Chronic kidney disease 03/23/2013   PCP:  Janann Meadow, NP Pharmacy:   [facility] - [place], [place] - [address] [place] [address] Phone: [phone] Fax: [phone]     Social Drivers of Health (SDOH) Social History: SDOH Screenings   Food Insecurity: Patient Declined (11/06/2023)  Housing: Patient Declined (11/06/2023)  Transportation Needs: Patient Declined (11/06/2023)  Recent Concern: Transportation Needs - Unmet Transportation Needs (10/13/2023)  Utilities: Patient Declined (11/06/2023)  Alcohol Screen: Low Risk  (10/05/2019)  Financial Resource Strain:  Low Risk  (10/08/2018)  Physical Activity: Unknown (10/08/2018)  Social Connections: Unknown (10/08/2018)   Stress: No Stress Concern Present (10/08/2018)  Tobacco Use: Low Risk  (11/05/2023)   SDOH Interventions:     Readmission Risk Interventions    10/15/2023    2:37 PM 07/06/2023    9:31 AM 06/17/2023    2:42 PM  Readmission Risk Prevention Plan  Transportation Screening Complete Complete Complete  PCP or Specialist Appt within 3-5 Days  Complete   Social Work Consult for Recovery Care Planning/Counseling  Complete   Palliative Care Screening  Complete   Medication Review [employer]ographer)  Complete Complete  PCP or Specialist appointment within 3-5 days of discharge   Complete  SW Recovery Care/Counseling Consult Complete  Complete  Palliative Care Screening Complete  Not Applicable  Skilled Nursing Facility   Not Applicable

## 2023-11-07 NOTE — Progress Notes (Signed)
 Daily Progress Note   Patient Name: Todd Brown       Date: 11/07/2023 DOB: Dec 09, 1960  Age: 63 y.o. MRN#: [id number] Attending Physician: Ezzard Holms, MD Primary Care Physician: Janann Meadow, NP Admit Date: 11/05/2023  Reason for Consultation/Follow-up: Establishing goals of care  HPI/Brief Hospital Review:  63 y.o. male  with past medical history of end-stage renal disease on HD (MWF), schizophrenia, hypertension, hyperlipidemia, COPD with chronic hypoxic respiratory failure on 2 L nasal cannula at baseline, CAD, recurrent ascites requiring paracentesis, hypothyroidism, blindness admitted from dialysis center on 11/05/2023 with shortness of breath.  On EMS arrival found to be hypoxic and potential aspiration event as he was eating and continuously coughing with a weakened cough.   Admitted and being treated for acute on chronic hypoxic respiratory failure as well as aspiration pneumonitis   Noted on CT imaging this admission progression of the large right lower lobe lung nodule with associated effusion-overall strong suspicion for underlying malignancy, from chart review and previous admission legal guardian has declined further workup   DNR status established last month during palliative visit   Palliative medicine was consulted for assisting with goals of care conversations.  Subjective: Extensive chart review has been completed prior to meeting patient including labs, vital signs, imaging, progress notes, orders, and available advanced directive documents from current and previous encounters.    Visited with [name] at his bedside.  He is awake, alert, unable to engage in goals of care conversations.  He is oriented to self and place but unclear of situation.  He denies acute  pain or discomfort.  Abdomen remains distended and taut but without tenderness.  Mr. Dy requests ice cream-nursing notified.  Called and spoke with legal guardian [name].  Reviewed latest CT imaging with Candace revealing progression of the large right lower lobe lung mass with overall strong suspicion for underlying malignancy.  Also discussed concern for bronchial narrowing due to mass contributing to symptoms of cough, congestion and shortness of breath as well as potentially contributing to dysphagia.  Candace confirms understanding of recent imaging results.  Discussed with Candace concern for ongoing or worsening symptoms related to mass.  Candace shares at this time she is not ready to make transition to comfort care/hospice care given Mr. Carrick would have to discontinue dialysis  at that time.  She remains open to having outpatient palliative care following him on his return to facility.  Candace is also open to considering hospice care services if symptoms continue and progress.  Answered and addressed all questions and concerns.  PMT to step away from daily visits as goals and plans remain clear and in place.  Please reengage for needs or concerns as they arise.  Objective:  Physical Exam Constitutional:      General: He is not in acute distress.    Appearance: He is ill-appearing.  Pulmonary:     Effort: Pulmonary effort is normal. No respiratory distress.  Abdominal:     General: There is distension.     Tenderness: There is no abdominal tenderness.  Neurological:     Mental Status: He is alert. He is disoriented.             Vital Signs: BP 134/87 (BP Location: Right Leg)   Pulse 74   Temp 97.6 F (36.4 C)   Resp 16   Ht 6\' 3"  (1.905 m)   Wt 65.8 kg   SpO2 99%   BMI 18.13 kg/m  SpO2: SpO2: 99 % O2 Device: O2 Device: Nasal Cannula O2 Flow Rate: O2 Flow Rate (L/min): 5 L/min   Palliative Care Assessment & Plan    Assessment/Recommendation/Plan  DNR/DNI Outpatient palliative services requested by legal guardian-confirming with [facility]  Care plan was discussed with primary team and nursing staff  Thank you for allowing the Palliative Medicine Team to assist in the care of this patient.  Total time: 50 minutes    Time spent includes: Detailed review of medical records (labs, imaging, vital signs), medically appropriate exam (mental status, respiratory, cardiac, skin), discussed with treatment team, counseling and educating patient, family and staff, documenting clinical information, medication management and coordination of care.  Isadore Marble, DNP, AGNP-C Palliative Medicine   Please contact Palliative Medicine Team phone at [phone] for questions and concerns.

## 2023-11-07 NOTE — Progress Notes (Signed)
 Pt is being fed when this RN comes into room. Pt is coughing after nearly every bite and yelling out for a breathing treatment while NT is feeding him. Instructed tech to stop feeding. Attempted to educate pt on swallowing. Pt was at a full 90 degrees while being fed. Recommend further ST evaluation if nursing is to continue feeding pt.

## 2023-11-07 NOTE — Progress Notes (Signed)
 Progress Note   Patient: Todd Brown:[id number] DOB: February 13, 1961 DOA: 11/05/2023     1 DOS: the patient was seen and examined on 11/07/2023    Brief hospital course: CORWYN VORA is a 63 y.o. male with medical history significant of ESRD on HD (MWF), schizophrenia, hypertension, hyperlipidemia, COPD with chronic hypoxic respiratory failure on 2 L, CAD, recurrent ascites requiring paracentesis, hypothyroidism, blindness, who presents to the ED due to shortness of breath.   History obtained through chart review due to patient's altered mental status and noncooperation. Per chart review, EMS was called to the dialysis center when patient developed shortness of breath approximately 45 minutes after starting his dialysis.  On their arrival, they noted that he was hypoxic on his home 2 L.  In addition, he was eating and continuously coughing while eating with a weak cough.  On arrival to the ED, he denied any chest pain, nausea, vomiting, abdominal pain.  He endorsed congestion and cough only.   My personal evaluation, patient not wanting to answer questions and resisting physical exam.   ED course: On arrival to the ED, patient was hypertensive at 136/94 with heart rate of 84.  He was saturating at 90% on 2 L.  He was afebrile at 98.1. Initial workup notable for unremarkable CBC and BMP with BUN of 38, creatinine 7.3 and GFR of 8.  COVID-19, influenza and RSV PCR negative.  Chest x-ray with similar round lobular right mid to lower lobe opacity and similar streaky opacities in the left lung.  Patient started on azithromycin , Rocephin , and DuoNebs.  TRH contacted for admission.   Assessment and Plan:    * Acute on chronic hypoxic respiratory failure (HCC) Patient is presenting after developing shortness of breath during dialysis with initial requirement of 8 L prior to being quickly weaned back down to home 2 L.  He is coughing and hacking repeatedly saying.  I suspect that enlarging lung mass  is playing a significant role here given evidence of bronchial narrowing on CT today.  In addition, he is a high aspiration risk and may be repeatedly aspirating while eating, as this is what he was doing when his shortness of breath started. Continue supplemental oxygen Will follow-up on palliative care recommendation Continue antibiotics for now, although suspect a very low utility in the setting.   Aspiration pneumonitis (HCC) On EMS arrival, patient was noted to be eating and coughing/hacking repeatedly.  On CT, no new findings other than enlarging known lung mass. Continue speech therapy eval   Lung mass On previous admission, CT imaging demonstrated a large right lower lobe with an associated effusion, in addition to a left lower lobe nodule.  Overall, strong suspicion for underlying malignancy, for which patient and his legal guardian have declined further workup.  CT imaging today demonstrates progression.   - As noted above, palliative care consulted   ESRD on hemodialysis [facility]) - Nephrology consulted; appreciate their recommendations - Dialysis per nephrology   COPD (chronic obstructive pulmonary disease) (HCC) - DuoNebs every 6 hours - Continue home bronchodilators   Ascites On semination, it appears that patient's ascites has reaccumulated since his last paracentesis on May 19.  Does not seem to be causing significant discomfort at this time though.   - Consider repeat paracentesis prior to discharge   Hypothyroidism Continue home Synthroid    CAD (coronary artery disease) No ported chest pain at this time.   - Continue home regimen   Essential hypertension - Continue home  regimen   Schizophrenia (HCC) - Continue home regimen   Advance Care Planning:   Code Status: Limited: Do not attempt resuscitation (DNR) -DNR-LIMITED -Do Not Intubate/DNI  per palliative care discussions with patient's legal guardian on 10/14/2023.    Consults: Nephrology   Family  Communication: No family at bedside     Subjective:  Patient continues to appear very sick Still requiring intranasal oxygen Denies chest pain or abdominal pain   Physical Exam:   Constitutional:      General: He is not in acute distress.    Appearance: He is normal weight.  HENT:     Head: Normocephalic and atraumatic.  Cardiovascular:     Rate and Rhythm: Normal rate and regular rhythm.  Pulmonary: On oxygen with transmitted sounds bilaterally Abdominal:     General: There is distension.     Palpations: Abdomen is soft.     Tenderness: There is no abdominal tenderness.  Musculoskeletal:     Right lower leg: No edema.     Left lower leg: No edema.  Skin:    General: Skin is warm and dry.  Neurological:     Mental Status: He is alert.     Comments:  Patient is very difficult to understand, a dentulous.  Moving all extremities against gravity  Psychiatric:        Behavior: Behavior is agitated.   Family Communication: None present at bedside   Disposition: Pending clinical course   Time spent: 47 minutes   Data Reviewed:   Vitals:   11/07/23 0324 11/07/23 0731 11/07/23 1029 11/07/23 1649  BP: 111/83  (!) 153/89 134/87  Pulse: 71  87 74  Resp: 16  18 16   Temp: 98.5 F (36.9 C)  98.3 F (36.8 C) 97.6 F (36.4 C)  TempSrc: Oral     SpO2: 98% 96% 97% 99%  Weight:      Height:          Latest Ref Rng & Units 11/07/2023    4:59 AM 11/06/2023    1:43 PM 11/05/2023   12:48 PM  CBC  WBC 4.0 - 10.5 K/uL 5.0  6.1  8.3   Hemoglobin 13.0 - 17.0 g/dL 16.1  09.6  04.5   Hematocrit 39.0 - 52.0 % 39.5  39.0  47.3   Platelets 150 - 400 K/uL 181  198  212        Latest Ref Rng & Units 11/07/2023    4:59 AM 11/06/2023    1:43 PM 11/05/2023   12:48 PM  BMP  Glucose 70 - 99 mg/dL 409  811  89   BUN 8 - 23 mg/dL 35  49  38   Creatinine 0.61 - 1.24 mg/dL 9.14  7.82  9.56   Sodium 135 - 145 mmol/L 135  138  139   Potassium 3.5 - 5.1 mmol/L 4.3  4.2  4.1   Chloride 98 - 111  mmol/L 97  98  96   CO2 22 - 32 mmol/L 26  28  26    Calcium  8.9 - 10.3 mg/dL 8.5  8.6  8.8      Author: Ezzard Holms, MD 11/07/2023 4:53 PM  For on call review [email or web address].

## 2023-11-07 NOTE — Progress Notes (Signed)
 [facility]  PROGRESS NOTE   Subjective:   Patient seen at bedside comfortable this morning.  Objective:  Vital signs: Blood pressure (!) 153/89, pulse 87, temperature 98.3 F (36.8 C), resp. rate 18, height 6\' 3"  (1.905 m), weight 65.8 kg, SpO2 97%.  Intake/Output Summary (Last 24 hours) at 11/07/2023 1114 Last data filed at 11/07/2023 0400 Gross per 24 hour  Intake 866.4 ml  Output 3000 ml  Net -2133.6 ml   Filed Weights   11/05/23 1222 11/06/23 1323 11/06/23 1838  Weight: 74.6 kg 68.8 kg 65.8 kg     Physical Exam: General:  No acute distress  Head:  Normocephalic, atraumatic. Moist oral mucosal membranes  Eyes:  Anicteric  Neck:  Supple  Lungs:   Clear to auscultation, normal effort  Heart:  S1S2 no rubs  Abdomen:   Soft, nontender, bowel sounds present  Extremities:  peripheral edema.  Neurologic:  Awake, alert, following commands  Skin:  No lesions  Access:     Basic Metabolic Panel: Recent Labs  Lab 11/05/23 1248 11/06/23 1343 11/07/23 0459  NA 139 138 135  K 4.1 4.2 4.3  CL 96* 98 97*  CO2 26 28 26   GLUCOSE 89 104* 100*  BUN 38* 49* 35*  CREATININE 7.38* 8.21* 6.09*  CALCIUM  8.8* 8.6* 8.5*  PHOS  --  6.5*  --    GFR: Estimated Creatinine Clearance: 11.6 mL/min (A) (by C-G formula based on SCr of 6.09 mg/dL (H)).  Liver Function Tests: Recent Labs  Lab 11/06/23 1343  ALBUMIN  2.6*   No results for input(s): "LIPASE", "AMYLASE" in the last 168 hours. No results for input(s): "AMMONIA" in the last 168 hours.  CBC: Recent Labs  Lab 11/05/23 1248 11/06/23 1343 11/07/23 0459  WBC 8.3 6.1 5.0  NEUTROABS 6.7  --  3.8  HGB 15.7 13.2 13.0  HCT 47.3 39.0 39.5  MCV 99.2 98.5 97.8  PLT 212 198 181     HbA1C: Hgb A1c MFr Bld  Date/Time Value Ref Range Status  11/03/2022 11:45 AM 5.9 (H) 4.8 - 5.6 % Final    Comment:    (NOTE)         Prediabetes: 5.7 - 6.4         Diabetes: >6.4         Glycemic control for adults with  diabetes: <7.0   09/12/2018 05:01 AM 5.0 4.8 - 5.6 % Final    Comment:    (NOTE)         Prediabetes: 5.7 - 6.4         Diabetes: >6.4         Glycemic control for adults with diabetes: <7.0     Urinalysis: No results for input(s): "COLORURINE", "LABSPEC", "PHURINE", "GLUCOSEU", "HGBUR", "BILIRUBINUR", "KETONESUR", "PROTEINUR", "UROBILINOGEN", "NITRITE", "LEUKOCYTESUR" in the last 72 hours.  Invalid input(s): "APPERANCEUR"    Imaging: CT Chest Wo Contrast Result Date: 11/05/2023 CLINICAL DATA:  Shortness of breath and hypoxia. Possible aspiration with pneumonia not excluded on a portable chest radiograph earlier today. There was a continued mass-like opacity in the right lung suspicious for underlying malignancy. EXAM: CT CHEST WITHOUT CONTRAST TECHNIQUE: Multidetector CT imaging of the chest was performed following the standard protocol without IV contrast. RADIATION DOSE REDUCTION: This exam was performed according to the departmental dose-optimization program which includes automated exposure control, adjustment of the mA and/or kV according to patient size and/or use of iterative reconstruction technique. COMPARISON:  Chest CT dated 10/13/2023. Portable chest  radiographs obtained earlier today and on 10/13/2023. FINDINGS: Cardiovascular: Mild atheromatous calcifications, including the coronary arteries and aorta. Stable mildly enlarged heart. Slight increase in size of a small pericardial effusion with a maximum thickness of 6 mm. The central pulmonary arteries remain enlarged with a main pulmonary artery diameter of 3.9 cm. Mediastinum/Nodes: Enlarged right paratracheal lymph nodes are again demonstrated. A previously demonstrated index 12 mm short axis node has a short axis diameter of 10 mm today. Unremarkable thyroid  gland, trachea and esophagus. A small hiatal hernia is again noted. Lungs/Pleura: Again demonstrated is dense consolidation in the lower lobes with air bronchograms and a more  confluent area of density in the right lower lobe suggesting a mass. This includes a hilar component encasing the proximal right upper lobe, right lower lobe and bronchus intermedius with diffuse narrowing of those bronchi, without significant change. The combined hilar and right lower lobe mass-like component currently measures 10.9 x 7.9 cm on image number 86/2, previously 9.5 x 6.8 cm. The previously demonstrated 12 mm right lower lobe nodule currently measures 13 mm in maximum diameter on image number 81/4. No new nodules seen. Stable mild changes of centrilobular emphysema throughout both lungs. Moderate-sized pleural effusion with probable partial loculations has increased mildly in size. No significant change in a small left pleural effusion. Upper Abdomen: A moderate to large amount of ascites is again demonstrated with enlarged lateral segment left lobe and caudate lobes of the liver suggesting possible cirrhosis of the liver. The spleen remains normal in size. Both kidneys remain small and contain multiple incompletely evaluated cysts not needing specific imaging follow-up at this time as well as a 2 mm calculus in each kidney. Musculoskeletal: Mild thoracic and moderate lower cervical spine degenerative changes. Stable healed mid sternal fracture. IMPRESSION: 1. Stable dense consolidation in the lower lobes with air bronchograms and a more confluent area of density in the right lower lobe suggesting a mass. This includes a hilar component encasing the proximal right upper lobe, right lower lobe and bronchus intermedius with diffuse narrowing of those bronchi. The combined hilar and right lower lobe mass-like component has increased mildly in size, currently measuring 10.9 x 7.9 cm. 2. Mild increase in size of a moderate-sized right pleural effusion with probable partial loculations. 3. No significant change in a small left pleural effusion. 4. Slight increase in size of a small pericardial effusion. 5.  Stable to slightly improved mild right paratracheal adenopathy. This could be metastatic or reactive in nature. 6. Stable mild changes of centrilobular emphysema throughout both lungs. 7. Stable enlargement of the central pulmonary arteries, suggesting pulmonary arterial hypertension. 8. Stable moderate to large amount of ascites in the upper abdomen with liver findings suggesting possible cirrhosis of the liver. 9. Stable bilateral renal atrophy with multiple incompletely evaluated cysts not needing specific imaging follow-up at this time as well as a 2 mm calculus in each kidney. 10. Stable small hiatal hernia. Aortic Atherosclerosis (ICD10-I70.0) and Emphysema (ICD10-J43.9). Electronically Signed   By: Catherin Closs M.D.   On: 11/05/2023 16:01   DG Chest Portable 1 View Result Date: 11/05/2023 CLINICAL DATA:  Shortness of breath. EXAM: PORTABLE CHEST 1 VIEW COMPARISON:  10/13/2023. FINDINGS: Stable cardiomediastinal silhouette. Similar round lobular right mid to lower lobe opacity, corresponding to area of masslike opacity noted in the right lung on the prior CT chest dated 10/13/2023. Persistent right basilar atelectasis with small right effusion. Similar streaky opacities in the left lung with left lower lobe nodular appearing  opacity, which may correspond to the previously noted left lower lobe nodule on the prior CT chest 10/13/2023. No pneumothorax. No acute osseous abnormality. IMPRESSION: 1. Similar round lobular right mid to lower lobe opacity, corresponding to area of reported masslike opacity in the right lung on the prior CT chest dated 10/13/2023, concerning for underlying malignancy. Superimposed infiltrate or edema cannot be excluded. 2. Similar streaky opacities in the left lung, which could reflect infiltrate or edema, with left lower lobe nodular appearing opacity, which may correspond to the previously noted left lower lobe nodule on the prior CT chest. Electronically Signed   By: Mannie Seek M.D.   On: 11/05/2023 13:17     Medications:    ampicillin -sulbactam (UNASYN ) IV 3 g (11/07/23 0518)    carbamazepine   1,000 mg Oral q morning   Chlorhexidine  Gluconate Cloth  6 each Topical Q0600   ferrous sulfate   325 mg Oral Q supper   fluticasone  furoate-vilanterol  1 puff Inhalation Daily   heparin   5,000 Units Subcutaneous Q8H   ipratropium-albuterol   3 mL Nebulization TID   isosorbide  mononitrate  60 mg Oral Daily   levothyroxine   125 mcg Oral Q0600   pantoprazole   40 mg Oral Daily   rosuvastatin   10 mg Oral QHS   sodium chloride  flush  3 mL Intravenous Q12H   trihexyphenidyl   2 mg Oral BID    Assessment/ Plan:     63 y.o. male with medical history significant of ESRD on HD (MWF), schizophrenia, hypertension, hyperlipidemia, COPD with chronic hypoxic respiratory failure on 2 L, CAD, recurrent ascites requiring paracentesis, hypothyroidism, blindness, who presents to the ED due to shortness of breath.  As per chart he developed shortness of breath while on dialysis treatment.  The treatment was aborted and he was sent to the emergency room.  He tested negative for influenza and COVID.  He was found to have questionable pneumonia and was started on azithromycin  and Rocephin .   ESRD: Stable last night with AV fistula.  Next dialysis tomorrow as patient was on Monday Wednesday Friday schedule.   ANEMIA: Continue anemia protocol with erythropoietin  and iron .   MBD: Check PTH, calcium  and phosphorus levels.   HTN/VOL: Blood pressure is now improved.     Shortness of breath/pneumonia: Continue inhalers along with Unasyn .  He was also given azithromycin  and Rocephin .  Labs and medications reviewed. Will continue to follow along with you.   LOS: 1 Worthy Heads, MD [facility] 6/8/202511:14 AM

## 2023-11-07 NOTE — Progress Notes (Signed)
 [facility] Nurse Liaison Note  Todd Brown is a current [facility] palliative care patient followed at [facility] ([facility]) in [place].  [phone] [place], [place]  [address]  Phone number:  [phone])  Patient is ward of the state with DSS guardianship.  [name] is his guardian [phone].  Hospital liaison team will follow through final discharge plan.  Ambrosio Junker, RN Nurse Liaison [phone]

## 2023-11-07 NOTE — Plan of Care (Signed)

## 2023-11-08 DIAGNOSIS — J9621 Acute and chronic respiratory failure with hypoxia: Secondary | ICD-10-CM | POA: Diagnosis not present

## 2023-11-08 LAB — CBC WITH DIFFERENTIAL/PLATELET
Abs Immature Granulocytes: 0.03 10*3/uL (ref 0.00–0.07)
Basophils Absolute: 0 10*3/uL (ref 0.0–0.1)
Basophils Relative: 0 %
Eosinophils Absolute: 0.1 10*3/uL (ref 0.0–0.5)
Eosinophils Relative: 2 %
HCT: 39.8 % (ref 39.0–52.0)
Hemoglobin: 13.2 g/dL (ref 13.0–17.0)
Immature Granulocytes: 0 %
Lymphocytes Relative: 9 %
Lymphs Abs: 0.7 10*3/uL (ref 0.7–4.0)
MCH: 32.8 pg (ref 26.0–34.0)
MCHC: 33.2 g/dL (ref 30.0–36.0)
MCV: 98.8 fL (ref 80.0–100.0)
Monocytes Absolute: 0.7 10*3/uL (ref 0.1–1.0)
Monocytes Relative: 10 %
Neutro Abs: 5.9 10*3/uL (ref 1.7–7.7)
Neutrophils Relative %: 79 %
Platelets: 192 10*3/uL (ref 150–400)
RBC: 4.03 MIL/uL — ABNORMAL LOW (ref 4.22–5.81)
RDW: 13.7 % (ref 11.5–15.5)
WBC: 7.6 10*3/uL (ref 4.0–10.5)
nRBC: 0 % (ref 0.0–0.2)

## 2023-11-08 LAB — BASIC METABOLIC PANEL WITH GFR
Anion gap: 16 — ABNORMAL HIGH (ref 5–15)
BUN: 47 mg/dL — ABNORMAL HIGH (ref 8–23)
CO2: 24 mmol/L (ref 22–32)
Calcium: 8.4 mg/dL — ABNORMAL LOW (ref 8.9–10.3)
Chloride: 97 mmol/L — ABNORMAL LOW (ref 98–111)
Creatinine, Ser: 7.37 mg/dL — ABNORMAL HIGH (ref 0.61–1.24)
GFR, Estimated: 8 mL/min — ABNORMAL LOW (ref >60)
Glucose, Bld: 78 mg/dL (ref 70–99)
Potassium: 5.1 mmol/L (ref 3.5–5.1)
Sodium: 137 mmol/L (ref 135–145)

## 2023-11-08 LAB — GLUCOSE, CAPILLARY: Glucose-Capillary: 127 mg/dL — ABNORMAL HIGH (ref 70–99)

## 2023-11-08 MED ORDER — ACETAMINOPHEN 325 MG PO TABS
ORAL_TABLET | ORAL | Status: AC
  Filled 2023-11-08: qty 2

## 2023-11-08 MED ORDER — ZIPRASIDONE HCL 20 MG PO CAPS
20.0000 mg | ORAL_CAPSULE | Freq: Two times a day (BID) | ORAL | Status: DC
  Administered 2023-11-08 – 2023-11-10 (×5): 20 mg via ORAL
  Filled 2023-11-08 (×6): qty 1

## 2023-11-08 NOTE — Plan of Care (Signed)

## 2023-11-08 NOTE — Progress Notes (Signed)
 AM RN reported pt difficulty swallowing during meals but stated pt did fine with pudding/applesauce for meds... recommend puree as pt tolerating it better.  PT agitated at beginning of shift frequently yelling into the hallway.

## 2023-11-08 NOTE — Progress Notes (Addendum)
 [facility]  PROGRESS NOTE   Subjective:   Patient seen and evaluated during dialysis   HEMODIALYSIS FLOWSHEET:  Blood Flow Rate (mL/min): 299 mL/min Arterial Pressure (mmHg): -128.27 mmHg Venous Pressure (mmHg): 251.91 mmHg TMP (mmHg): 5.25 mmHg Ultrafiltration Rate (mL/min): 1057 mL/min Dialysate Flow Rate (mL/min): 299 ml/min  Denies any shortness of breath currently Denies pain  Objective:  Vital signs: Blood pressure (!) 134/100, pulse (!) 47, temperature 99 F (37.2 C), temperature source Oral, resp. rate (!) 24, height 6\' 3"  (1.905 m), weight 67.5 kg, SpO2 95%.  Intake/Output Summary (Last 24 hours) at 11/08/2023 1026 Last data filed at 11/07/2023 1931 Gross per 24 hour  Intake 680 ml  Output --  Net 680 ml   Filed Weights   11/06/23 1323 11/06/23 1838 11/08/23 0746  Weight: 68.8 kg 65.8 kg 67.5 kg     Physical Exam: General:  Restless, agitated   Head:  Normocephalic, atraumatic. Moist oral mucosal membranes  Eyes:  Anicteric  Neck:  Supple  Lungs:   Clear to auscultation, normal effort  Heart:  S1S2 no rubs  Abdomen:   Soft, nontender, bowel sounds present  Extremities: Trace peripheral edema.  Neurologic:  Awake, alert, hallucinating  Skin:  No lesions  Access: Right AVF    Basic Metabolic Panel: Recent Labs  Lab 11/05/23 1248 11/06/23 1343 11/07/23 0459 11/08/23 0450  NA 139 138 135 137  K 4.1 4.2 4.3 5.1  CL 96* 98 97* 97*  CO2 26 28 26 24   GLUCOSE 89 104* 100* 78  BUN 38* 49* 35* 47*  CREATININE 7.38* 8.21* 6.09* 7.37*  CALCIUM  8.8* 8.6* 8.5* 8.4*  PHOS  --  6.5*  --   --    GFR: Estimated Creatinine Clearance: 9.8 mL/min (A) (by C-G formula based on SCr of 7.37 mg/dL (H)).  Liver Function Tests: Recent Labs  Lab 11/06/23 1343  ALBUMIN  2.6*   No results for input(s): "LIPASE", "AMYLASE" in the last 168 hours. No results for input(s): "AMMONIA" in the last 168 hours.  CBC: Recent Labs  Lab 11/05/23 1248  11/06/23 1343 11/07/23 0459 11/08/23 0450  WBC 8.3 6.1 5.0 7.6  NEUTROABS 6.7  --  3.8 5.9  HGB 15.7 13.2 13.0 13.2  HCT 47.3 39.0 39.5 39.8  MCV 99.2 98.5 97.8 98.8  PLT 212 198 181 192     HbA1C: Hgb A1c MFr Bld  Date/Time Value Ref Range Status  11/03/2022 11:45 AM 5.9 (H) 4.8 - 5.6 % Final    Comment:    (NOTE)         Prediabetes: 5.7 - 6.4         Diabetes: >6.4         Glycemic control for adults with diabetes: <7.0   09/12/2018 05:01 AM 5.0 4.8 - 5.6 % Final    Comment:    (NOTE)         Prediabetes: 5.7 - 6.4         Diabetes: >6.4         Glycemic control for adults with diabetes: <7.0     Urinalysis: No results for input(s): "COLORURINE", "LABSPEC", "PHURINE", "GLUCOSEU", "HGBUR", "BILIRUBINUR", "KETONESUR", "PROTEINUR", "UROBILINOGEN", "NITRITE", "LEUKOCYTESUR" in the last 72 hours.  Invalid input(s): "APPERANCEUR"    Imaging: No results found.    Medications:    ampicillin -sulbactam (UNASYN ) IV 3 g (11/08/23 0544)    carbamazepine   1,000 mg Oral q morning   Chlorhexidine  Gluconate Cloth  6 each Topical  [id number]   ferrous sulfate   325 mg Oral Q supper   fluticasone  furoate-vilanterol  1 puff Inhalation Daily   heparin   5,000 Units Subcutaneous Q8H   ipratropium-albuterol   3 mL Nebulization TID   isosorbide  mononitrate  60 mg Oral Daily   levothyroxine   125 mcg Oral Q0600   pantoprazole   40 mg Oral Daily   rosuvastatin   10 mg Oral QHS   sodium chloride  flush  3 mL Intravenous Q12H   trihexyphenidyl   2 mg Oral BID    Assessment/ Plan:     63 y.o. male with medical history significant of ESRD on HD (MWF), schizophrenia, hypertension, hyperlipidemia, COPD with chronic hypoxic respiratory failure on 2 L, CAD, recurrent ascites requiring paracentesis, hypothyroidism, blindness, who presents to the ED due to shortness of breath.  As per chart he developed shortness of breath while on dialysis treatment.  The treatment was aborted and he was sent to the  emergency room.  He tested negative for influenza and COVID.  He was found to have questionable pneumonia and was started on azithromycin  and Rocephin .   ESRD: Patient receiving scheduled dialysis today, UF goal 3 L as tolerated.  Next treatment scheduled for Wednesday. Patient completed treated but experienced restlessness, very loud arguing and agitated behavior. This has become a concern in the outpatient clinic also, to the point where outpatient clinic may discharge patient. Requesting a palliative and/or behavioral assessment to address these concerns.    ANEMIA with chronic kidney disease.:  Hemoglobin currently 13.2, acceptable for this patient.  Due to presence of lung mass, would avoid ESA's.   Secondary hyperparathyroidism: PTH 1024 on 11/03/2023.  Patient receives cholecalciferol  outpatient.  Calcium  acceptable at 8.4.  Will continue to monitor bone minerals during this admission.   Hypertension with chronic kidney disease.  Outpatient regimen includes isosorbide .: Blood pressure 124/90 during dialysis   Shortness of breath/pneumonia: Continue inhalers along with Unasyn .  He was also given azithromycin  and Rocephin .    LOS: 2 [facility] 6/9/202510:26 AM

## 2023-11-08 NOTE — Progress Notes (Signed)
 Hemodialysis Note:  Received patient in bed to unit. Alert and oriented. Informed consent singed and in chart.  Treatment initiated: 0805 Treatment completed: 1148  Access used: Left AVF Access issues: None  Patient tolerated well. Transported back to room, alert without acute distress. Report given to patient's RN.  Total UF removed: 3 Liters Medications given: Tylenol  650 mg Tablet  Post HD weight: 64.5 Kg  [facility]

## 2023-11-08 NOTE — Progress Notes (Signed)
 Progress Note   Patient: Todd Brown:[id number] DOB: 04-29-1961 DOA: 11/05/2023     2 DOS: the patient was seen and examined on 11/08/2023     Brief hospital course: Todd Brown is a 63 y.o. male with medical history significant of ESRD on HD (MWF), schizophrenia, hypertension, hyperlipidemia, COPD with chronic hypoxic respiratory failure on 2 L, CAD, recurrent ascites requiring paracentesis, hypothyroidism, blindness, who presents to the ED due to shortness of breath.   History obtained through chart review due to patient's altered mental status and noncooperation. Per chart review, EMS was called to the dialysis center when patient developed shortness of breath approximately 45 minutes after starting his dialysis.  On their arrival, they noted that he was hypoxic on his home 2 L.  In addition, he was eating and continuously coughing while eating with a weak cough.  On arrival to the ED, he denied any chest pain, nausea, vomiting, abdominal pain.  He endorsed congestion and cough only.   My personal evaluation, patient not wanting to answer questions and resisting physical exam.   ED course: On arrival to the ED, patient was hypertensive at 136/94 with heart rate of 84.  He was saturating at 90% on 2 L.  He was afebrile at 98.1. Initial workup notable for unremarkable CBC and BMP with BUN of 38, creatinine 7.3 and GFR of 8.  COVID-19, influenza and RSV PCR negative.  Chest x-ray with similar round lobular right mid to lower lobe opacity and similar streaky opacities in the left lung.  Patient started on azithromycin , Rocephin , and DuoNebs.  TRH contacted for admission.   Assessment and Plan:    * Acute on chronic hypoxic respiratory failure (HCC) Patient is presenting after developing shortness of breath during dialysis with initial requirement of 8 L prior to being quickly weaned back down to home 2 L.  He is coughing and hacking repeatedly saying.  I suspect that enlarging lung  mass is playing a significant role here given evidence of bronchial narrowing on CT today.  In addition, he is a high aspiration risk and may be repeatedly aspirating while eating, as this is what he was doing when his shortness of breath started. Continue supplemental oxygen Will follow-up on palliative care recommendation Continue antibiotics for now, although suspect a very low utility in the setting.   Aspiration pneumonitis (HCC) On EMS arrival, patient was noted to be eating and coughing/hacking repeatedly.  On CT, no new findings other than enlarging known lung mass. Continue speech therapy eval   Lung mass On previous admission, CT imaging demonstrated a large right lower lobe with an associated effusion, in addition to a left lower lobe nodule.  Overall, strong suspicion for underlying malignancy, for which patient and his legal guardian have declined further workup.  CT imaging today demonstrates progression.   - As noted above, palliative care consulted   ESRD on hemodialysis [facility]) - Nephrology consulted; appreciate their recommendations - Dialysis per nephrology   COPD (chronic obstructive pulmonary disease) (HCC) - DuoNebs every 6 hours - Continue home bronchodilators   Ascites On semination, it appears that patient's ascites has reaccumulated since his last paracentesis on May 19.  Does not seem to be causing significant discomfort at this time though.   - Consider repeat paracentesis prior to discharge   Hypothyroidism Continue home Synthroid    CAD (coronary artery disease) No ported chest pain at this time.   - Continue home regimen   Essential hypertension - Continue  home regimen   Schizophrenia (HCC) - Continue home regimen   Advance Care Planning:   Code Status: Limited: Do not attempt resuscitation (DNR) -DNR-LIMITED -Do Not Intubate/DNI  per palliative care discussions with patient's legal guardian on 10/14/2023.    Consults: Nephrology   Family  Communication: No family at bedside     Subjective:  Patient continues to appear very sick Appears agitated Denies nausea vomiting Mood stabilizer added  Palliative care have seen patient   Physical Exam:   Constitutional:      General: He is not in acute distress.    Appearance: He is normal weight.  HENT:     Head: Normocephalic and atraumatic.  Cardiovascular:     Rate and Rhythm: Normal rate and regular rhythm.  Pulmonary: On oxygen with transmitted sounds bilaterally Abdominal:     General: There is distension.     Palpations: Abdomen is soft.     Tenderness: There is no abdominal tenderness.  Musculoskeletal:     Right lower leg: No edema.     Left lower leg: No edema.  Skin:    General: Skin is warm and dry.  Neurological:     Mental Status: He is alert.     Comments:  Patient is very difficult to understand, a dentulous.  Moving all extremities against gravity  Psychiatric:        Behavior: Behavior is agitated.     Family Communication: None present at bedside   Disposition: Pending clinical course   Time spent: 42 minutes   Data Reviewed:     Vitals:   11/08/23 1115 11/08/23 1130 11/08/23 1148 11/08/23 1456  BP: 108/89 102/66 103/69 106/75  Pulse: (!) 30 80 80 84  Resp: (!) 26 19 20 18   Temp:   97.6 F (36.4 C) 98.6 F (37 C)  TempSrc:   Oral Oral  SpO2: (!) 88% 98% 95% 95%  Weight:   64.5 kg   Height:          Latest Ref Rng & Units 11/08/2023    4:50 AM 11/07/2023    4:59 AM 11/06/2023    1:43 PM  CBC  WBC 4.0 - 10.5 K/uL 7.6  5.0  6.1   Hemoglobin 13.0 - 17.0 g/dL 16.1  09.6  04.5   Hematocrit 39.0 - 52.0 % 39.8  39.5  39.0   Platelets 150 - 400 K/uL 192  181  198        Latest Ref Rng & Units 11/08/2023    4:50 AM 11/07/2023    4:59 AM 11/06/2023    1:43 PM  BMP  Glucose 70 - 99 mg/dL 78  409  811   BUN 8 - 23 mg/dL 47  35  49   Creatinine 0.61 - 1.24 mg/dL 9.14  7.82  9.56   Sodium 135 - 145 mmol/L 137  135  138   Potassium 3.5 - 5.1  mmol/L 5.1  4.3  4.2   Chloride 98 - 111 mmol/L 97  97  98   CO2 22 - 32 mmol/L 24  26  28    Calcium  8.9 - 10.3 mg/dL 8.4  8.5  8.6      Author: Ezzard Holms, MD 11/08/2023 4:48 PM  For on call review [email or web address].

## 2023-11-09 DIAGNOSIS — J9621 Acute and chronic respiratory failure with hypoxia: Secondary | ICD-10-CM | POA: Diagnosis not present

## 2023-11-09 LAB — CBC WITH DIFFERENTIAL/PLATELET
Abs Immature Granulocytes: 0.02 10*3/uL (ref 0.00–0.07)
Basophils Absolute: 0 10*3/uL (ref 0.0–0.1)
Basophils Relative: 1 %
Eosinophils Absolute: 0.1 10*3/uL (ref 0.0–0.5)
Eosinophils Relative: 2 %
HCT: 39.2 % (ref 39.0–52.0)
Hemoglobin: 12.9 g/dL — ABNORMAL LOW (ref 13.0–17.0)
Immature Granulocytes: 0 %
Lymphocytes Relative: 9 %
Lymphs Abs: 0.5 10*3/uL — ABNORMAL LOW (ref 0.7–4.0)
MCH: 32.2 pg (ref 26.0–34.0)
MCHC: 32.9 g/dL (ref 30.0–36.0)
MCV: 97.8 fL (ref 80.0–100.0)
Monocytes Absolute: 0.7 10*3/uL (ref 0.1–1.0)
Monocytes Relative: 11 %
Neutro Abs: 4.9 10*3/uL (ref 1.7–7.7)
Neutrophils Relative %: 77 %
Platelets: 198 10*3/uL (ref 150–400)
RBC: 4.01 MIL/uL — ABNORMAL LOW (ref 4.22–5.81)
RDW: 13.4 % (ref 11.5–15.5)
WBC: 6.4 10*3/uL (ref 4.0–10.5)
nRBC: 0 % (ref 0.0–0.2)

## 2023-11-09 LAB — BASIC METABOLIC PANEL WITH GFR
Anion gap: 16 — ABNORMAL HIGH (ref 5–15)
BUN: 38 mg/dL — ABNORMAL HIGH (ref 8–23)
CO2: 27 mmol/L (ref 22–32)
Calcium: 8.8 mg/dL — ABNORMAL LOW (ref 8.9–10.3)
Chloride: 94 mmol/L — ABNORMAL LOW (ref 98–111)
Creatinine, Ser: 5.92 mg/dL — ABNORMAL HIGH (ref 0.61–1.24)
GFR, Estimated: 10 mL/min — ABNORMAL LOW (ref >60)
Glucose, Bld: 70 mg/dL (ref 70–99)
Potassium: 4.4 mmol/L (ref 3.5–5.1)
Sodium: 137 mmol/L (ref 135–145)

## 2023-11-09 LAB — HEPATITIS B SURFACE ANTIBODY, QUANTITATIVE: Hep B S AB Quant (Post): 210 m[IU]/mL

## 2023-11-09 MED ORDER — IPRATROPIUM-ALBUTEROL 0.5-2.5 (3) MG/3ML IN SOLN
3.0000 mL | Freq: Two times a day (BID) | RESPIRATORY_TRACT | Status: DC
  Administered 2023-11-09 – 2023-11-10 (×2): 3 mL via RESPIRATORY_TRACT
  Filled 2023-11-09 (×2): qty 3

## 2023-11-09 MED ORDER — IPRATROPIUM-ALBUTEROL 0.5-2.5 (3) MG/3ML IN SOLN: 3.0000 mL | Freq: Four times a day (QID) | RESPIRATORY_TRACT | Status: DC | PRN

## 2023-11-09 NOTE — Progress Notes (Signed)
 Mobility Specialist - Progress Note   11/09/23 1000  Mobility  Activity Stood at bedside;Dangled on edge of bed  Level of Assistance Moderate assist, patient does 50-74%  Assistive Device Other (Comment) (B HHA)  Activity Response Tolerated well  Mobility visit 1 Mobility  Mobility Specialist Start Time (ACUTE ONLY) 1012  Mobility Specialist Stop Time (ACUTE ONLY) 1022  Mobility Specialist Time Calculation (min) (ACUTE ONLY) 10 min     Pt lying in bed upon arrival, utilizing 4L. Pt asking to "help me get up". Completed bed mobility with modA + trunk support although pt demos good trunk control pulling self forward while in semi-fowlers position. Hand-over-hand assist d/t visual deficits. Pt dangled EOB ~ 5 minutes with minG. STS with minA +2 B HHA. Fatigued with activity. Pt returned to bed with alarm set, needs in reach.    Searcy Czech Mobility Specialist 11/09/23, 10:28 AM

## 2023-11-09 NOTE — TOC Progression Note (Addendum)
 Transition of Care [facility]) - Progression Note    Patient Details  Name: Todd Brown MRN: [id number] Date of Birth: 09-Apr-1961  [facility] CM/SW Contact  Loman Risk, RN Phone Number: 11/09/2023, 1:55 PM  Clinical Narrative:     Per Bartholomew Light with [facility] patient is no longer active with their home health, as patient switched to [facility] and they are not in [facility] sent to [facility] to determine who his Home o2 is through.  At previous discharge facility had difficulty bringing a portable tank for discharge.  Patient has RW and WC at the facility     Expected Discharge Plan and Services                                               Social Determinants of Health (SDOH) Interventions SDOH Screenings   Food Insecurity: Patient Declined (11/06/2023)  Housing: Patient Declined (11/06/2023)  Transportation Needs: Patient Declined (11/06/2023)  Recent Concern: Transportation Needs - Unmet Transportation Needs (10/13/2023)  Utilities: Patient Declined (11/06/2023)  Alcohol Screen: Low Risk  (10/05/2019)  Financial Resource Strain: Low Risk  (10/08/2018)  Physical Activity: Unknown (10/08/2018)  Social Connections: Unknown (10/08/2018)  Stress: No Stress Concern Present (10/08/2018)  Tobacco Use: Low Risk  (11/05/2023)    Readmission Risk Interventions    11/09/2023    1:54 PM 10/15/2023    2:37 PM 07/06/2023    9:31 AM  Readmission Risk Prevention Plan  Transportation Screening Complete Complete Complete  PCP or Specialist Appt within 3-5 Days   Complete  Social Work Consult for Recovery Care Planning/Counseling   Complete  Palliative Care Screening   Complete  Medication Review Oceanographer) Complete  Complete  SW Recovery Care/Counseling Consult  Complete   Palliative Care Screening Complete Complete   Skilled Nursing Facility Not Applicable

## 2023-11-09 NOTE — Plan of Care (Signed)
  Problem: Coping: Goal: Level of anxiety will decrease Outcome: Progressing   Problem: Pain Managment: Goal: General experience of comfort will improve and/or be controlled Outcome: Progressing   Problem: Safety: Goal: Ability to remain free from injury will improve Outcome: Progressing   Problem: Skin Integrity: Goal: Risk for impaired skin integrity will decrease Outcome: Progressing

## 2023-11-09 NOTE — Progress Notes (Signed)
 Progress Note   Patient: Todd Brown QMV:[id number] DOB: 1960-06-14 DOA: 11/05/2023     3 DOS: the patient was seen and examined on 11/09/2023    Brief hospital course: From HPI "Todd Brown is a 63 y.o. male with medical history significant of ESRD on HD (MWF), schizophrenia, hypertension, hyperlipidemia, COPD with chronic hypoxic respiratory failure on 2 L, CAD, recurrent ascites requiring paracentesis, hypothyroidism, blindness, who presents to the ED due to shortness of breath. ED course: On arrival to the ED, patient was hypertensive at 136/94 with heart rate of 84.  He was saturating at 90% on 2 L.  He was afebrile at 98.1. Initial workup notable for unremarkable CBC and BMP with BUN of 38, creatinine 7.3 and GFR of 8.  COVID-19, influenza and RSV PCR negative.  Chest x-ray with similar round lobular right mid to lower lobe opacity and similar streaky opacities in the left lung.  Patient started on azithromycin , Rocephin , and DuoNebs.  TRH contacted for admission.  "   Assessment and Plan:    * Acute on chronic hypoxic respiratory failure (HCC) Patient is presenting after developing shortness of breath during dialysis with initial requirement of 8 L prior to being quickly weaned back down to home 2 L.  This is likely secondary to enlarging lung mass.  In addition, he is a high aspiration risk Continue supplemental oxygen Will follow-up on palliative care recommendation Continue antibiotics for now, although suspect a very low utility in the setting. Patient has been seen by speech therapist Palliative care consulted given underlying lung mass that patient and family are not willing to undergo therapy..   Aspiration pneumonitis (HCC) On CT, no new findings other than enlarging known lung mass. Continue speech therapy eval Continue current antibiotics  Lung mass On previous admission, CT imaging demonstrated a large right lower lobe with an associated effusion, in addition to  a left lower lobe nodule.  Overall, strong suspicion for underlying malignancy, for which patient and his legal guardian have declined further workup.  CT imaging this hospitalization demonstrates progression. Appreciate palliative care's input    ESRD on hemodialysis [facility]) - Nephrology consulted; appreciate their recommendations - Continue dialysis per nephrology   COPD (chronic obstructive pulmonary disease) (HCC) - Continue DuoNebs every 6 hours - Continue home bronchodilators   Ascites On semination, it appears that patient's ascites has reaccumulated since his last paracentesis on May 19.  Does not seem to be causing significant discomfort at this time though. We will consider repeat paracentesis prior to discharge   Hypothyroidism Continue home Synthroid    CAD (coronary artery disease) No ported chest pain at this time.   - Continue home regimen   Essential hypertension - Continue home regimen   Schizophrenia (HCC) - Continue home regimen   Advance Care Planning:   Code Status: Limited: Do not attempt resuscitation (DNR) -DNR-LIMITED -Do Not Intubate/DNI  per palliative care discussions with patient's legal guardian on 10/14/2023.    Consults: Nephrology   Family Communication: No family at bedside     Subjective:  Patient continues to appear very sick Agitation is improving Continues to require 4 L of nasal oxygen Denies nausea vomiting abdominal pain    Physical Exam:   Constitutional:      General: He is not in acute distress.    Appearance: He is normal weight.  HENT:     Head: Normocephalic and atraumatic.  Cardiovascular:     Rate and Rhythm: Normal rate and regular rhythm.  Pulmonary: On oxygen with transmitted sounds bilaterally Abdominal:     General: There is distension.     Palpations: Abdomen is soft.     Tenderness: There is no abdominal tenderness.  Musculoskeletal:     Right lower leg: No edema.     Left lower leg: No edema.  Skin:     General: Skin is warm and dry.  Neurological:     Mental Status: He is alert.     Comments:  Patient is very difficult to understand.  Moving all extremities against gravity  Psychiatric:        Behavior: Behavior is agitated.     Family Communication: None present at bedside   Disposition: Pending clinical course   Time spent: 41 minutes   Data Reviewed:      Latest Ref Rng & Units 11/09/2023    3:58 AM 11/08/2023    4:50 AM 11/07/2023    4:59 AM  CBC  WBC 4.0 - 10.5 K/uL 6.4  7.6  5.0   Hemoglobin 13.0 - 17.0 g/dL 81.1  91.4  78.2   Hematocrit 39.0 - 52.0 % 39.2  39.8  39.5   Platelets 150 - 400 K/uL 198  192  181        Latest Ref Rng & Units 11/09/2023    3:58 AM 11/08/2023    4:50 AM 11/07/2023    4:59 AM  BMP  Glucose 70 - 99 mg/dL 70  78  956   BUN 8 - 23 mg/dL 38  47  35   Creatinine 0.61 - 1.24 mg/dL 2.13  0.86  5.78   Sodium 135 - 145 mmol/L 137  137  135   Potassium 3.5 - 5.1 mmol/L 4.4  5.1  4.3   Chloride 98 - 111 mmol/L 94  97  97   CO2 22 - 32 mmol/L 27  24  26    Calcium  8.9 - 10.3 mg/dL 8.8  8.4  8.5      Vitals:   11/08/23 1456 11/08/23 2021 11/09/23 0330 11/09/23 0810  BP: 106/75 114/78 (!) 138/97 (!) 151/87  Pulse: 84 87 75 73  Resp: 18 20 20 17   Temp: 98.6 F (37 C) 99 F (37.2 C) 99.1 F (37.3 C) 98.1 F (36.7 C)  TempSrc: Oral Oral Oral   SpO2: 95% 95% 99% 99%  Weight:      Height:         Disposition: PEnding clinical course   Author: Ezzard Holms, MD 11/09/2023 4:28 PM  For on call review [email or web address].

## 2023-11-09 NOTE — Progress Notes (Signed)
 [facility]  PROGRESS NOTE   Subjective:   Patient laying in bed Continues to speak with others, while in room alone Denies any pain or needs   Objective:  Vital signs: Blood pressure (!) 151/87, pulse 73, temperature 98.1 F (36.7 C), resp. rate 17, height 6\' 3"  (1.905 m), weight 64.5 kg, SpO2 99%.  Intake/Output Summary (Last 24 hours) at 11/09/2023 1228 Last data filed at 11/09/2023 1000 Gross per 24 hour  Intake 317.14 ml  Output --  Net 317.14 ml   Filed Weights   11/06/23 1838 11/08/23 0746 11/08/23 1148  Weight: 65.8 kg 67.5 kg 64.5 kg     Physical Exam: General:  NAD  Head:  Normocephalic, atraumatic. Moist oral mucosal membranes  Eyes:  Anicteric  Lungs:   Clear to auscultation, normal effort  Heart:  S1S2 no rubs  Abdomen:   Soft, nontender, bowel sounds present  Extremities: Trace peripheral edema.  Neurologic:  Awake, alert, hallucinating  Skin:  No lesions  Access: Right AVF    Basic Metabolic Panel: Recent Labs  Lab 11/05/23 1248 11/06/23 1343 11/07/23 0459 11/08/23 0450 11/09/23 0358  NA 139 138 135 137 137  K 4.1 4.2 4.3 5.1 4.4  CL 96* 98 97* 97* 94*  CO2 26 28 26 24 27   GLUCOSE 89 104* 100* 78 70  BUN 38* 49* 35* 47* 38*  CREATININE 7.38* 8.21* 6.09* 7.37* 5.92*  CALCIUM  8.8* 8.6* 8.5* 8.4* 8.8*  PHOS  --  6.5*  --   --   --    GFR: Estimated Creatinine Clearance: 11.7 mL/min (A) (by C-G formula based on SCr of 5.92 mg/dL (H)).  Liver Function Tests: Recent Labs  Lab 11/06/23 1343  ALBUMIN  2.6*   No results for input(s): "LIPASE", "AMYLASE" in the last 168 hours. No results for input(s): "AMMONIA" in the last 168 hours.  CBC: Recent Labs  Lab 11/05/23 1248 11/06/23 1343 11/07/23 0459 11/08/23 0450 11/09/23 0358  WBC 8.3 6.1 5.0 7.6 6.4  NEUTROABS 6.7  --  3.8 5.9 4.9  HGB 15.7 13.2 13.0 13.2 12.9*  HCT 47.3 39.0 39.5 39.8 39.2  MCV 99.2 98.5 97.8 98.8 97.8  PLT 212 198 181 192 198     HbA1C: Hgb A1c  MFr Bld  Date/Time Value Ref Range Status  11/03/2022 11:45 AM 5.9 (H) 4.8 - 5.6 % Final    Comment:    (NOTE)         Prediabetes: 5.7 - 6.4         Diabetes: >6.4         Glycemic control for adults with diabetes: <7.0   09/12/2018 05:01 AM 5.0 4.8 - 5.6 % Final    Comment:    (NOTE)         Prediabetes: 5.7 - 6.4         Diabetes: >6.4         Glycemic control for adults with diabetes: <7.0     Urinalysis: No results for input(s): "COLORURINE", "LABSPEC", "PHURINE", "GLUCOSEU", "HGBUR", "BILIRUBINUR", "KETONESUR", "PROTEINUR", "UROBILINOGEN", "NITRITE", "LEUKOCYTESUR" in the last 72 hours.  Invalid input(s): "APPERANCEUR"    Imaging: No results found.    Medications:    ampicillin -sulbactam (UNASYN ) IV 3 g (11/09/23 0531)    carbamazepine   1,000 mg Oral q morning   Chlorhexidine  Gluconate Cloth  6 each Topical Q0600   ferrous sulfate   325 mg Oral Q supper   fluticasone  furoate-vilanterol  1 puff Inhalation Daily  heparin   5,000 Units Subcutaneous Q8H   ipratropium-albuterol   3 mL Nebulization TID   isosorbide  mononitrate  60 mg Oral Daily   levothyroxine   125 mcg Oral Q0600   pantoprazole   40 mg Oral Daily   rosuvastatin   10 mg Oral QHS   sodium chloride  flush  3 mL Intravenous Q12H   trihexyphenidyl   2 mg Oral BID   ziprasidone  20 mg Oral BID WC    Assessment/ Plan:     63 y.o. male with medical history significant of ESRD on HD (MWF), schizophrenia, hypertension, hyperlipidemia, COPD with chronic hypoxic respiratory failure on 2 L, CAD, recurrent ascites requiring paracentesis, hypothyroidism, blindness, who presents to the ED due to shortness of breath.  As per chart he developed shortness of breath while on dialysis treatment.  The treatment was aborted and he was sent to the emergency room.  He tested negative for influenza and COVID.  He was found to have questionable pneumonia and was started on azithromycin  and Rocephin .   ESRD: Received dialysis  yesterday with UF 3L fluid removal. Will schedule next treatment on Wednesday.    Anemia with chronic kidney disease.:  Hemoglobin currently 12.9.  Due to presence of lung mass, would avoid ESA's.   Secondary hyperparathyroidism: PTH 1024 on 11/03/2023.  Patient receives cholecalciferol  outpatient.  Calcium  8.8.    Hypertension with chronic kidney disease.  Outpatient regimen includes isosorbide .: Blood pressure 151/87   Shortness of breath/pneumonia: Continue inhalers along with Unasyn .  He was also given azithromycin  and Rocephin . Remains on 4L [place]    LOS: 3 [facility] 6/10/202512:28 PM

## 2023-11-10 DIAGNOSIS — J9621 Acute and chronic respiratory failure with hypoxia: Secondary | ICD-10-CM | POA: Diagnosis not present

## 2023-11-10 LAB — CBC WITH DIFFERENTIAL/PLATELET
Abs Immature Granulocytes: 0.03 10*3/uL (ref 0.00–0.07)
Basophils Absolute: 0 10*3/uL (ref 0.0–0.1)
Basophils Relative: 0 %
Eosinophils Absolute: 0.1 10*3/uL (ref 0.0–0.5)
Eosinophils Relative: 2 %
HCT: 39.1 % (ref 39.0–52.0)
Hemoglobin: 13.2 g/dL (ref 13.0–17.0)
Immature Granulocytes: 1 %
Lymphocytes Relative: 8 %
Lymphs Abs: 0.4 10*3/uL — ABNORMAL LOW (ref 0.7–4.0)
MCH: 32.8 pg (ref 26.0–34.0)
MCHC: 33.8 g/dL (ref 30.0–36.0)
MCV: 97 fL (ref 80.0–100.0)
Monocytes Absolute: 0.5 10*3/uL (ref 0.1–1.0)
Monocytes Relative: 9 %
Neutro Abs: 4.4 10*3/uL (ref 1.7–7.7)
Neutrophils Relative %: 80 %
Platelets: 211 10*3/uL (ref 150–400)
RBC: 4.03 MIL/uL — ABNORMAL LOW (ref 4.22–5.81)
RDW: 13.2 % (ref 11.5–15.5)
WBC: 5.4 10*3/uL (ref 4.0–10.5)
nRBC: 0 % (ref 0.0–0.2)

## 2023-11-10 LAB — BASIC METABOLIC PANEL WITH GFR
Anion gap: 14 (ref 5–15)
BUN: 49 mg/dL — ABNORMAL HIGH (ref 8–23)
CO2: 27 mmol/L (ref 22–32)
Calcium: 8.8 mg/dL — ABNORMAL LOW (ref 8.9–10.3)
Chloride: 94 mmol/L — ABNORMAL LOW (ref 98–111)
Creatinine, Ser: 7.12 mg/dL — ABNORMAL HIGH (ref 0.61–1.24)
GFR, Estimated: 8 mL/min — ABNORMAL LOW (ref >60)
Glucose, Bld: 75 mg/dL (ref 70–99)
Potassium: 5.1 mmol/L (ref 3.5–5.1)
Sodium: 135 mmol/L (ref 135–145)

## 2023-11-10 LAB — GLUCOSE, CAPILLARY: Glucose-Capillary: 111 mg/dL — ABNORMAL HIGH (ref 70–99)

## 2023-11-10 MED ORDER — AMOXICILLIN-POT CLAVULANATE 875-125 MG PO TABS: 1.0000 | ORAL_TABLET | Freq: Two times a day (BID) | ORAL | Status: DC

## 2023-11-10 MED ORDER — TRAMADOL HCL 50 MG PO TABS
50.0000 mg | ORAL_TABLET | Freq: Four times a day (QID) | ORAL | 0 refills | Status: DC | PRN
Start: 2023-11-10 — End: 2023-11-19

## 2023-11-10 MED ORDER — LIDOCAINE-PRILOCAINE 2.5-2.5 % EX CREA: 1.0000 | TOPICAL_CREAM | CUTANEOUS | Status: DC | PRN

## 2023-11-10 MED ORDER — PENTAFLUOROPROP-TETRAFLUOROETH EX AERO: 1.0000 | INHALATION_SPRAY | CUTANEOUS | Status: DC | PRN

## 2023-11-10 MED ORDER — AMOXICILLIN-POT CLAVULANATE 875-125 MG PO TABS: 1.0000 | ORAL_TABLET | Freq: Two times a day (BID) | ORAL | 0 refills | Status: AC

## 2023-11-10 MED ORDER — LORAZEPAM 1 MG PO TABS: 1.0000 mg | ORAL_TABLET | Freq: Three times a day (TID) | ORAL | 0 refills | Status: DC | PRN

## 2023-11-10 MED ORDER — TRAMADOL HCL 50 MG PO TABS: 50.0000 mg | ORAL_TABLET | Freq: Four times a day (QID) | ORAL | 0 refills | Status: DC | PRN

## 2023-11-10 MED ORDER — ZIPRASIDONE HCL 20 MG PO CAPS: 20.0000 mg | ORAL_CAPSULE | Freq: Two times a day (BID) | ORAL | Status: DC

## 2023-11-10 MED ORDER — HEPARIN SODIUM (PORCINE) 1000 UNIT/ML DIALYSIS: 1000.0000 [IU] | INTRAMUSCULAR | Status: DC | PRN

## 2023-11-10 NOTE — TOC Transition Note (Signed)
 Transition of Care [facility]) - Discharge Note   Patient Details  Name: Todd Brown MRN: [id number] Date of Birth: Sep 28, 1960  Transition [facility]) CM/SW Contact:  Loman Risk, RN Phone Number: 11/10/2023, 4:07 PM   Clinical Narrative:     Patient qualified for home o2. Portable o2 delivered room for discharge   [name]  Anticipated DC date: 11/10/23  Family notified: [name] with [facility] by: Katherina Pan with [name]   Per MD patient ready for DC to . RN, patient's guardian, and facility notified of DC. Discharge Summary and fl2 sent to facility. RN given number for report. DC packet on chart.   TOC signing off.         Patient Goals and CMS Choice            Discharge Placement                       Discharge Plan and Services Additional resources added to the After Visit Summary for                                       Social Drivers of Health (SDOH) Interventions SDOH Screenings   Food Insecurity: Patient Declined (11/06/2023)  Housing: Patient Declined (11/06/2023)  Transportation Needs: Patient Declined (11/06/2023)  Recent Concern: Transportation Needs - Unmet Transportation Needs (10/13/2023)  Utilities: Patient Declined (11/06/2023)  Alcohol Screen: Low Risk  (10/05/2019)  Financial Resource Strain: Low Risk  (10/08/2018)  Physical Activity: Unknown (10/08/2018)  Social Connections: Unknown (10/08/2018)  Stress: No Stress Concern Present (10/08/2018)  Tobacco Use: Low Risk  (11/05/2023)     Readmission Risk Interventions    11/09/2023    1:54 PM 10/15/2023    2:37 PM 07/06/2023    9:31 AM  Readmission Risk Prevention Plan  Transportation Screening Complete Complete Complete  PCP or Specialist Appt within 3-5 Days   Complete  Social Work Consult for Recovery Care Planning/Counseling   Complete  Palliative Care Screening   Complete  Medication Review Oceanographer) Complete  Complete  SW  Recovery Care/Counseling Consult  Complete   Palliative Care Screening Complete Complete   Skilled Nursing Facility Not Applicable

## 2023-11-10 NOTE — Progress Notes (Signed)
   11/10/23 1021  Vitals  Pulse Rate 79  ECG Heart Rate 79  Resp 19  Oxygen Therapy  SpO2 97 %  O2 Device Nasal Cannula  O2 Flow Rate (L/min) 3 L/min  Patient Activity (if Appropriate) In bed  Pulse Oximetry Type Continuous  During Treatment Monitoring  Blood Flow Rate (mL/min) 0 mL/min  Arterial Pressure (mmHg) 20 mmHg  Venous Pressure (mmHg) -31.31 mmHg  TMP (mmHg) 19.19 mmHg  Ultrafiltration Rate (mL/min) 1317 mL/min  Dialysate Flow Rate (mL/min) 299 ml/min  Dialysate Potassium Concentration 2  Dialysate Calcium  Concentration 2.5  Duration of HD Treatment -hour(s) 1.18 hour(s)  Cumulative Fluid Removed (mL) per Treatment  647.88  Post Treatment  Dialyzer Clearance Lightly streaked  Hemodialysis Intake (mL) 0 mL  Liters Processed 22.9  Fluid Removed (mL) 600 mL  Tolerated HD Treatment Yes  Post-Hemodialysis Comments tx ended: duration of tx 1;11  AVG/AVF Arterial Site Held (minutes) 5 minutes  AVG/AVF Venous Site Held (minutes) 5 minutes  Fistula / Graft Left Upper arm Arteriovenous fistula  No Placement Date or Time found.   Orientation: Left  Access Location: Upper arm  Access Type: Arteriovenous fistula  Site Condition No complications  Status Deaccessed  Drainage Description None   Received patient in bed to unit.  Alert and oriented.  Informed consent signed and in chart.   TX duration 1:11  Patient tolerated well.  Transported back to the room  Alert, without acute distress.  Hand-off given to patient's nurse.   Access used: LUA fistula Access issues: venous hgh pressures  Total UF removed: 600 Medication(s) given: none Post HD VS: see above Post HD weight: n/a   [facility]

## 2023-11-10 NOTE — [place] FL2 (Signed)
 [place]  MEDICAID FL2 LEVEL OF CARE FORM     IDENTIFICATION  Patient Name: Todd Brown Birthdate: June 06, 1960 Sex: male Admission Date (Current Location): 11/05/2023  [facility] [place] Number:  Chiropodist and Address:         Provider Number: [id number]  Attending Physician Name and Address:  Melodi Sprung, DO  Relative Name and Phone Number:       Current Level of Care: Hospital Recommended Level of Care: Assisted Living Facility Prior Approval Number:    Date Approved/Denied:   PASRR Number:    Discharge Plan: Other (Comment) (ALF)    Current Diagnoses: Patient Active Problem List   Diagnosis Date Noted   Acute hypoxic respiratory failure (HCC) 11/06/2023   Acute on chronic hypoxic respiratory failure (HCC) 11/05/2023   Dyspnea 10/14/2023   Postobstructive pneumonia 10/13/2023   Lung mass 10/13/2023   HLD (hyperlipidemia) 10/13/2023   Abdominal pain 10/13/2023   Ascites 10/13/2023   Nonspecific chest pain 07/07/2023   Influenza A 06/16/2023   Acute hypoxemic respiratory failure (HCC) 04/30/2023   Blindness of both eyes 04/30/2023   Elevated troponin 04/30/2023   Low back pain 04/30/2023   Productive cough 04/30/2023   Myocardial injury 11/03/2022   CAD (coronary artery disease) 11/03/2022   Hypothyroidism 11/03/2022   Thrombocytopenia (HCC) 11/03/2022   Aspiration pneumonitis (HCC) 11/03/2022   Abdominal distension 11/03/2022   HTN (hypertension) 11/03/2022   Pericardial effusion 11/03/2022   Community acquired pneumonia 09/14/2022   Anasarca (mild pulmonary edema, pleural and pericardial effusion, ascites on CT) 09/14/2022   Acute on chronic respiratory failure (HCC) 09/14/2022   Bacteremia 06/20/2022   Acute encephalopathy 06/19/2022   SIRS (systemic inflammatory response syndrome) (HCC) 06/19/2022   NSTEMI (non-ST elevated myocardial infarction) (HCC) 06/19/2022   Acute on chronic respiratory failure with hypoxia (HCC)  05/21/2022   Hypoxia 05/20/2022   COVID-19 virus infection 05/20/2022   Hyponatremia 02/10/2022   Anemia due to chronic kidney disease 01/21/2022   Hyperkalemia 01/14/2022   Malnutrition of moderate degree 12/24/2021   Oral thrush 12/22/2021   Physical deconditioning 12/20/2021   Acute metabolic encephalopathy 12/15/2021   Essential hypertension 12/15/2021   ESRD on hemodialysis (HCC) 12/15/2021   Altered mental status 10/08/2021   Hypertensive urgency 10/07/2021   COPD (chronic obstructive pulmonary disease) (HCC) 02/26/2021   GERD (gastroesophageal reflux disease) 02/26/2021   Hypertension 11/28/2020   Acute respiratory failure with hypoxia (HCC) 10/23/2019   Anemia of chronic disease 10/23/2019   Fluid overload 10/08/2018   Acute pulmonary edema (HCC) 09/12/2018   Scrotal edema 05/10/2017   Symptomatic anemia 04/30/2017   Anticoagulated on Coumadin  09/14/2016   Cardiac arrest (HCC) 08/03/2016   Severe sepsis versus SIRS 05/26/2016   Dialysis patient (HCC) 04/15/2016   ESRD needing dialysis (HCC) 02/12/2016   Hyperkalemia 05/20/2015   Hepatitis C 09/26/2013   Schizophrenia (HCC) 03/23/2013   Essential hypertension, benign 03/23/2013   Chronic kidney disease 03/23/2013    Orientation RESPIRATION BLADDER Height & Weight     Self, Time, Situation, Place  O2 (4L [place])  (anuric) Weight: 66.2 kg Height:  6' 3 (190.5 cm)  BEHAVIORAL SYMPTOMS/MOOD NEUROLOGICAL BOWEL NUTRITION STATUS      Continent Diet (dys 1)  AMBULATORY STATUS COMMUNICATION OF NEEDS Skin   Extensive Assist Verbally Other (Comment) (scratch marks)                       Personal Care Assistance Level of Assistance  Functional Limitations Info    Sight Info: Adequate        SPECIAL CARE FACTORS FREQUENCY                       Contractures Contractures Info: Not present    Additional Factors Info  Code Status, Allergies Code Status Info: DNR Allergies Info:  Chlorpromazine           Medication List       TAKE these medications     albuterol  108 (90 Base) MCG/ACT inhaler Commonly known as: VENTOLIN  HFA Inhale 1-2 puffs into the lungs every 4 (four) hours as needed for wheezing or shortness of breath.    amoxicillin -clavulanate 875-125 MG tablet Commonly known as: AUGMENTIN  Take 1 tablet by mouth 2 (two) times daily for 5 doses. First dose PM 11/10/2023 then bid starting 11/11/2023    ascorbic acid  500 MG tablet Commonly known as: VITAMIN C  Take 500 mg by mouth daily.    b complex-vitamin c -folic acid  0.8 MG Tabs tablet Take 1 tablet by mouth at bedtime.    benzonatate  100 MG capsule Commonly known as: TESSALON  Take 100 mg by mouth every 6 (six) hours as needed.    carbamazepine  200 MG tablet Commonly known as: TEGRETOL  Take 1,000 mg by mouth every morning.    cholecalciferol  1000 units tablet Commonly known as: VITAMIN D  Take 1,000 Units by mouth in the morning.    ferrous sulfate  325 (65 FE) MG tablet Take 1 tablet (325 mg total) by mouth daily.    Fluticasone -Salmeterol 250-50 MCG/DOSE Aepb Commonly known as: ADVAIR Inhale 1 puff into the lungs 2 (two) times daily.    hyoscyamine  0.125 MG SL tablet Commonly known as: LEVSIN SL Place 0.125 mg under the tongue every 4 (four) hours as needed for cramping.    ipratropium-albuterol  0.5-2.5 (3) MG/3ML Soln Commonly known as: DUONEB Take 3 mLs by nebulization every 6 (six) hours as needed.    isosorbide  mononitrate 60 MG 24 hr tablet Commonly known as: IMDUR  Take 1 tablet (60 mg total) by mouth daily.    levothyroxine  125 MCG tablet Commonly known as: Synthroid  Take 1 tablet (125 mcg total) by mouth daily.    LORazepam  1 MG tablet Commonly known as: ATIVAN  Take 1 tablet (1 mg total) by mouth every 8 (eight) hours as needed for anxiety. What changed:  when to take this reasons to take this    nicotine  21 mg/24hr patch Commonly known as: NICODERM CQ  - dosed in  mg/24 hours Place 1 patch (21 mg total) onto the skin daily.    omeprazole  40 MG capsule Commonly known as: PRILOSEC Take 40 mg by mouth every morning.    polyethylene glycol 17 g packet Commonly known as: MIRALAX  / GLYCOLAX  Take 17 g by mouth daily as needed for mild constipation.    rosuvastatin  10 MG tablet Commonly known as: CRESTOR  Take 10 mg by mouth at bedtime.    senna-docusate 8.6-50 MG tablet Commonly known as: Senokot-S Take 1 tablet by mouth 2 (two) times daily as needed for mild constipation.    traMADol  50 MG tablet Commonly known as: ULTRAM  Take 1 tablet (50 mg total) by mouth every 6 (six) hours as needed for moderate pain (pain score 4-6).    trihexyphenidyl  2 MG tablet Commonly known as: ARTANE  Take 2 mg by mouth 2 (two) times daily.    zinc  sulfate (50mg  elemental zinc ) 220 (50 Zn) MG capsule Take 220 mg by  mouth daily.    ziprasidone 20 MG capsule Commonly known as: GEODON Take 1 capsule (20 mg total) by mouth 2 (two) times daily with a meal.      Relevant Imaging Results:  Relevant Lab Results:   Additional Information SS #: [id number]  Loman Risk, RN

## 2023-11-10 NOTE — Progress Notes (Signed)
 SATURATION QUALIFICATIONS: (This note is used to comply with regulatory documentation for home oxygen)  Patient Saturations on Room Air at Rest = 87%  Patient Saturations on Room Air while Ambulating : pt is nonambulatory  Pt oxygen rebound to 93% on 4L via nasal cannula.

## 2023-11-10 NOTE — Progress Notes (Signed)
 [facility]  PROGRESS NOTE   Subjective:   Patient seen and evaluated during dialysis.    HEMODIALYSIS FLOWSHEET:  Blood Flow Rate (mL/min): 300 mL/min Arterial Pressure (mmHg): -116.36 mmHg Venous Pressure (mmHg): 325.24 mmHg TMP (mmHg): 9.9 mmHg Ultrafiltration Rate (mL/min): 1317 mL/min Dialysate Flow Rate (mL/min): 300 ml/min Dialysis Fluid Bolus: Normal Saline  Initially tolerating treatment well Became argumentative and demanded to be taken off early   Objective:  Vital signs: Blood pressure (!) 144/93, pulse 79, temperature 98.4 F (36.9 C), temperature source Oral, resp. rate 19, height 6' 3 (1.905 m), weight 66.2 kg, SpO2 97%.  Intake/Output Summary (Last 24 hours) at 11/10/2023 1037 Last data filed at 11/10/2023 1021 Gross per 24 hour  Intake 97.69 ml  Output 600 ml  Net -502.31 ml   Filed Weights   11/08/23 1148 11/10/23 0820 11/10/23 1021  Weight: 64.5 kg 66.8 kg 66.2 kg     Physical Exam: General:  NAD  Head:  Normocephalic, atraumatic. Moist oral mucosal membranes  Eyes:  Anicteric  Lungs:   Clear to auscultation, normal effort  Heart:  S1S2 no rubs  Abdomen:   Soft, nontender, bowel sounds present  Extremities: Trace peripheral edema.  Neurologic:  Awake, alert  Skin:  No lesions  Access: Right AVF    Basic Metabolic Panel: Recent Labs  Lab 11/06/23 1343 11/07/23 0459 11/08/23 0450 11/09/23 0358 11/10/23 0441  NA 138 135 137 137 135  K 4.2 4.3 5.1 4.4 5.1  CL 98 97* 97* 94* 94*  CO2 28 26 24 27 27   GLUCOSE 104* 100* 78 70 75  BUN 49* 35* 47* 38* 49*  CREATININE 8.21* 6.09* 7.37* 5.92* 7.12*  CALCIUM  8.6* 8.5* 8.4* 8.8* 8.8*  PHOS 6.5*  --   --   --   --    GFR: Estimated Creatinine Clearance: 9.9 mL/min (A) (by C-G formula based on SCr of 7.12 mg/dL (H)).  Liver Function Tests: Recent Labs  Lab 11/06/23 1343  ALBUMIN  2.6*   No results for input(s): LIPASE, AMYLASE in the last 168 hours. No results for  input(s): AMMONIA in the last 168 hours.  CBC: Recent Labs  Lab 11/05/23 1248 11/06/23 1343 11/07/23 0459 11/08/23 0450 11/09/23 0358 11/10/23 0441  WBC 8.3 6.1 5.0 7.6 6.4 5.4  NEUTROABS 6.7  --  3.8 5.9 4.9 4.4  HGB 15.7 13.2 13.0 13.2 12.9* 13.2  HCT 47.3 39.0 39.5 39.8 39.2 39.1  MCV 99.2 98.5 97.8 98.8 97.8 97.0  PLT 212 198 181 192 198 211     HbA1C: Hgb A1c MFr Bld  Date/Time Value Ref Range Status  11/03/2022 11:45 AM 5.9 (H) 4.8 - 5.6 % Final    Comment:    (NOTE)         Prediabetes: 5.7 - 6.4         Diabetes: >6.4         Glycemic control for adults with diabetes: <7.0   09/12/2018 05:01 AM 5.0 4.8 - 5.6 % Final    Comment:    (NOTE)         Prediabetes: 5.7 - 6.4         Diabetes: >6.4         Glycemic control for adults with diabetes: <7.0     Urinalysis: No results for input(s): COLORURINE, LABSPEC, PHURINE, GLUCOSEU, HGBUR, BILIRUBINUR, KETONESUR, PROTEINUR, UROBILINOGEN, NITRITE, LEUKOCYTESUR in the last 72 hours.  Invalid input(s): APPERANCEUR    Imaging: No results found.  Medications:    ampicillin -sulbactam (UNASYN ) IV Stopped (11/10/23 0649)    carbamazepine   1,000 mg Oral q morning   Chlorhexidine  Gluconate Cloth  6 each Topical Q0600   ferrous sulfate   325 mg Oral Q supper   fluticasone  furoate-vilanterol  1 puff Inhalation Daily   heparin   5,000 Units Subcutaneous Q8H   ipratropium-albuterol   3 mL Nebulization BID   isosorbide  mononitrate  60 mg Oral Daily   levothyroxine   125 mcg Oral Q0600   pantoprazole   40 mg Oral Daily   rosuvastatin   10 mg Oral QHS   sodium chloride  flush  3 mL Intravenous Q12H   trihexyphenidyl   2 mg Oral BID   ziprasidone  20 mg Oral BID WC    Assessment/ Plan:     63 y.o. male with medical history significant of ESRD on HD (MWF), schizophrenia, hypertension, hyperlipidemia, COPD with chronic hypoxic respiratory failure on 2 L, CAD, recurrent ascites requiring  paracentesis, hypothyroidism, blindness, who presents to the ED due to shortness of breath.  As per chart he developed shortness of breath while on dialysis treatment.  The treatment was aborted and he was sent to the emergency room.  He tested negative for influenza and COVID.  He was found to have questionable pneumonia and was started on azithromycin  and Rocephin .   ESRD: Receiving dialysis today, UF goal 2.5-3L as tolerated. Patient became belligerent and demanded to be taken off treatment. Patient was rinsed back early after 1 hour of treatment. Next treatment scheduled for Friday. Would continue to encourage Palliative to discuss goals with guardian as patient exhibits this same disruptive behavior at outpatient clinic.     Anemia with chronic kidney disease.:  Hemoglobin 13.2.  Due to presence of lung mass, would avoid ESA's.   Secondary hyperparathyroidism: PTH 1024 on 11/03/2023.  Patient receives cholecalciferol  outpatient.  Calcium  8.8. Will continue to monitor bone minerals.    Hypertension with chronic kidney disease.  Outpatient regimen includes isosorbide .: Blood pressure 134/92 during dialysis.   Shortness of breath/pneumonia: Continue inhalers along with Unasyn .  He was also given azithromycin  and Rocephin . Weaned to 3L [place]    LOS: 4 [facility] 6/11/202510:37 AM

## 2023-11-10 NOTE — TOC Progression Note (Signed)
 Transition of Care [facility]) - Progression Note    Patient Details  Name: Todd Brown MRN: [id number] Date of Birth: 1960-12-19  [facility] of [facility]) CM/SW Contact  Loman Risk, RN Phone Number: 11/10/2023, 10:57 AM  Clinical Narrative:     Per Katherina Pan Patients home o2 is through family Medical supply ([employer]) Per Harriet Limber with [employer] patient previously had home O2, but was picked up in March 2023.   MD and RN notified that patient will require qualifying O2 sats        Expected Discharge Plan and Services                                               Social Determinants of Health (SDOH) Interventions SDOH Screenings   Food Insecurity: Patient Declined (11/06/2023)  Housing: Patient Declined (11/06/2023)  Transportation Needs: Patient Declined (11/06/2023)  Recent Concern: Transportation Needs - Unmet Transportation Needs (10/13/2023)  Utilities: Patient Declined (11/06/2023)  Alcohol Screen: Low Risk  (10/05/2019)  Financial Resource Strain: Low Risk  (10/08/2018)  Physical Activity: Unknown (10/08/2018)  Social Connections: Unknown (10/08/2018)  Stress: No Stress Concern Present (10/08/2018)  Tobacco Use: Low Risk  (11/05/2023)    Readmission Risk Interventions    11/09/2023    1:54 PM 10/15/2023    2:37 PM 07/06/2023    9:31 AM  Readmission Risk Prevention Plan  Transportation Screening Complete Complete Complete  PCP or Specialist Appt within 3-5 Days   Complete  Social Work Consult for Recovery Care Planning/Counseling   Complete  Palliative Care Screening   Complete  Medication Review [employer]) Complete  Complete  SW Recovery Care/Counseling Consult  Complete   Palliative Care Screening Complete Complete   Skilled Nursing Facility Not Applicable

## 2023-11-10 NOTE — Discharge Summary (Signed)
 Physician Discharge Summary   Patient: Todd Brown MRN: [id number]  DOB: 10-02-1960   Admit:     Date of Admission: 11/05/2023 Admitted from: assisted living    Discharge: Date of discharge: 11/10/23 Disposition: Assisted living Condition at discharge: fair  CODE STATUS: DNR     Discharge Physician: Melodi Sprung, DO [facility]     PCP: Janann Meadow, NP  Recommendations for Outpatient Follow-up:  Follow up with PCP Janann Meadow, NP in 1-2 weeks Please obtain labs/tests: CBC, BMP in 1-2 weeks Follow as directed for dialysis Outpatient palliative care     Discharge Instructions     Diet general   Complete by: As directed    Increase activity slowly   Complete by: As directed          Discharge Diagnoses: Principal Problem:   Acute on chronic hypoxic respiratory failure (HCC) Active Problems:   Aspiration pneumonitis (HCC)   Lung mass   ESRD on hemodialysis (HCC)   COPD (chronic obstructive pulmonary disease) (HCC)   Ascites   Schizophrenia (HCC)   Essential hypertension   CAD (coronary artery disease)   Hypothyroidism   Acute hypoxic respiratory failure [facility])        Hospital course / significant events:   Todd Brown is a 63 y.o. male with medical history significant of ESRD on HD (MWF), schizophrenia, hypertension, hyperlipidemia, COPD with chronic hypoxic respiratory failure on 2 L, CAD, recurrent ascites requiring paracentesis, hypothyroidism, blindness, who presents to the ED due to shortness of breath On arrival to the ED, patient was hypertensive at 136/94 with heart rate of 84.  He was saturating at 90% on 2 L.  He was afebrile at 98.1. Initial workup notable for unremarkable CBC and BMP with BUN of 38, creatinine 7.3 and GFR of 8.  COVID-19, influenza and RSV PCR negative.  Chest x-ray with similar round lobular right mid to lower lobe opacity and similar streaky opacities in the left lung.  Patient started on  azithromycin , Rocephin , and DuoNebs.  [facility] contacted for admission.  Improving w/ dialysis. Expect will conitnue to be an issue w/ lung mass and at some point would consider transition to comfort measures       Consultants:  Nephrology  Procedures/Surgeries: None       ASSESSMENT & PLAN:   Acute on chronic hypoxic respiratory failure (HCC) Patient is presenting after developing shortness of breath during dialysis with initial requirement of 8 L prior to being quickly weaned back down to home 2 L.  This is likely secondary to enlarging lung mass.  In addition, he is a high aspiration risk Continue supplemental oxygen Continue antibiotics for now, although suspect a very low utility in the setting. Patient has been seen by speech therapist Palliative care consulted given underlying lung mass    Aspiration pneumonitis  On CT, no new findings other than enlarging known lung mass. Continue speech therapy eval Continue antibiotics   Lung mass On previous admission, CT imaging demonstrated a large right lower lobe with an associated effusion, in addition to a left lower lobe nodule.  Overall, strong suspicion for underlying malignancy, for which patient and his legal guardian have declined further workup.  CT imaging this hospitalization demonstrates progression. Appreciate palliative care's input   ESRD on hemodialysis Nephrology consulted; appreciate their recommendations Continue dialysis per nephrology   COPD (chronic obstructive pulmonary disease) Continue home bronchodilators   Ascites reaccumulated since his last paracentesis on May  19.  Does not seem to be causing significant discomfort at this time though. repeat paracentesis as needed   Hypothyroidism Continue home Synthroid    CAD (coronary artery disease) No ported chest pain at this time. Continue home regimen   Essential hypertension Continue home regimen   Schizophrenia  Continue home regimen   Advance  Care Planning:   Code Status: Limited: Do not attempt resuscitation (DNR) -DNR-LIMITED -Do Not Intubate/DNI  per palliative care discussions with patient's legal guardian on 10/14/2023.        Borderline underweight based on BMI: Body mass index is 18.24 kg/m.Aaron Aas Significantly low or high BMI is associated with higher medical risk.  Underweight - under 18  overweight - 25 to 29 obese - 30 or more Class 1 obesity: BMI of 30.0 to 34 Class 2 obesity: BMI of 35.0 to 39 Class 3 obesity: BMI of 40.0 to 49 Super Morbid Obesity: BMI 50-59 Super-super Morbid Obesity: BMI 60+ Healthy nutrition and physical activity advised as adjunct to other disease management and risk reduction treatments              Discharge Instructions  Allergies as of 11/10/2023       Reactions   Chlorpromazine Other (See Comments)   Reaction:  Unknown , pt states it makes him feel real bad Reaction:  Unknown , pt states it makes him feel real bad        Medication List     TAKE these medications    albuterol  108 (90 Base) MCG/ACT inhaler Commonly known as: VENTOLIN  HFA Inhale 1-2 puffs into the lungs every 4 (four) hours as needed for wheezing or shortness of breath.   amoxicillin -clavulanate 875-125 MG tablet Commonly known as: AUGMENTIN  Take 1 tablet by mouth 2 (two) times daily for 5 doses. First dose PM 11/10/2023 then bid starting 11/11/2023   ascorbic acid  500 MG tablet Commonly known as: VITAMIN C  Take 500 mg by mouth daily.   b complex-vitamin c -folic acid  0.8 MG Tabs tablet Take 1 tablet by mouth at bedtime.   benzonatate  100 MG capsule Commonly known as: TESSALON  Take 100 mg by mouth every 6 (six) hours as needed.   carbamazepine  200 MG tablet Commonly known as: TEGRETOL  Take 1,000 mg by mouth every morning.   cholecalciferol  1000 units tablet Commonly known as: VITAMIN D  Take 1,000 Units by mouth in the morning.   ferrous sulfate  325 (65 FE) MG tablet Take 1 tablet (325  mg total) by mouth daily.   Fluticasone -Salmeterol 250-50 MCG/DOSE Aepb Commonly known as: ADVAIR Inhale 1 puff into the lungs 2 (two) times daily.   hyoscyamine  0.125 MG SL tablet Commonly known as: LEVSIN SL Place 0.125 mg under the tongue every 4 (four) hours as needed for cramping.   ipratropium-albuterol  0.5-2.5 (3) MG/3ML Soln Commonly known as: DUONEB Take 3 mLs by nebulization every 6 (six) hours as needed.   isosorbide  mononitrate 60 MG 24 hr tablet Commonly known as: IMDUR  Take 1 tablet (60 mg total) by mouth daily.   levothyroxine  125 MCG tablet Commonly known as: Synthroid  Take 1 tablet (125 mcg total) by mouth daily.   LORazepam  1 MG tablet Commonly known as: ATIVAN  Take 1 tablet (1 mg total) by mouth every 8 (eight) hours as needed for anxiety. What changed:  when to take this reasons to take this   nicotine  21 mg/24hr patch Commonly known as: NICODERM CQ  - dosed in mg/24 hours Place 1 patch (21 mg total) onto the skin daily.  omeprazole  40 MG capsule Commonly known as: PRILOSEC Take 40 mg by mouth every morning.   polyethylene glycol 17 g packet Commonly known as: MIRALAX  / GLYCOLAX  Take 17 g by mouth daily as needed for mild constipation.   rosuvastatin  10 MG tablet Commonly known as: CRESTOR  Take 10 mg by mouth at bedtime.   senna-docusate 8.6-50 MG tablet Commonly known as: Senokot-S Take 1 tablet by mouth 2 (two) times daily as needed for mild constipation.   traMADol  50 MG tablet Commonly known as: ULTRAM  Take 1 tablet (50 mg total) by mouth every 6 (six) hours as needed for moderate pain (pain score 4-6).   trihexyphenidyl  2 MG tablet Commonly known as: ARTANE  Take 2 mg by mouth 2 (two) times daily.   zinc  sulfate (50mg  elemental zinc ) 220 (50 Zn) MG capsule Take 220 mg by mouth daily.   ziprasidone 20 MG capsule Commonly known as: GEODON Take 1 capsule (20 mg total) by mouth 2 (two) times daily with a meal.           Allergies  Allergen Reactions   Chlorpromazine Other (See Comments)    Reaction:  Unknown , pt states it makes him feel real bad Reaction:  Unknown , pt states it makes him feel real bad      Subjective: pt reports feeling fine this morning, no complaints    Discharge Exam: BP (!) 144/93 (BP Location: Right Arm)   Pulse 79   Temp 98.4 F (36.9 C) (Oral)   Resp 19   Ht 6' 3 (1.905 m)   Wt 66.2 kg   SpO2 97%   BMI 18.24 kg/m  General: Pt is alert, awake, not in acute distress Cardiovascular: RRR, S1/S2 +, no rubs, no gallops Respiratory: CTA bilaterally, no wheezing, no rhonchi Abdominal: Soft, NT, ND, bowel sounds + Extremities: no edema, no cyanosis     The results of significant diagnostics from this hospitalization (including imaging, microbiology, ancillary and laboratory) are listed below for reference.     Microbiology: Recent Results (from the past 240 hours)  Resp panel by RT-PCR (RSV, Flu A&B, Covid) Anterior Nasal Swab     Status: None   Collection Time: 11/05/23 12:48 PM   Specimen: Anterior Nasal Swab  Result Value Ref Range Status   SARS Coronavirus 2 by RT PCR NEGATIVE NEGATIVE Final    Comment: (NOTE) SARS-CoV-2 target nucleic acids are NOT DETECTED.  The SARS-CoV-2 RNA is generally detectable in upper respiratory specimens during the acute phase of infection. The lowest concentration of SARS-CoV-2 viral copies this assay can detect is 138 copies/mL. A negative result does not preclude SARS-Cov-2 infection and should not be used as the sole basis for treatment or other patient management decisions. A negative result may occur with  improper specimen collection/handling, submission of specimen other than nasopharyngeal swab, presence of viral mutation(s) within the areas targeted by this assay, and inadequate number of viral copies(<138 copies/mL). A negative result must be combined with clinical observations, patient history, and  epidemiological information. The expected result is Negative.  Fact Sheet for Patients:  BloggerCourse.com  Fact Sheet for Healthcare Providers:  SeriousBroker.it  This test is no t yet approved or cleared by the [place]  FDA and  has been authorized for detection and/or diagnosis of SARS-CoV-2 by FDA under an Emergency Use Authorization (EUA). This EUA will remain  in effect (meaning this test can be used) for the duration of the COVID-19 declaration under Section 564(b)(1) of the Act, 21  U.S.C.section 360bbb-3(b)(1), unless the authorization is terminated  or revoked sooner.       Influenza A by PCR NEGATIVE NEGATIVE Final   Influenza B by PCR NEGATIVE NEGATIVE Final    Comment: (NOTE) The Xpert Xpress SARS-CoV-2/FLU/RSV plus assay is intended as an aid in the diagnosis of influenza from Nasopharyngeal swab specimens and should not be used as a sole basis for treatment. Nasal washings and aspirates are unacceptable for Xpert Xpress SARS-CoV-2/FLU/RSV testing.  Fact Sheet for Patients: BloggerCourse.com  Fact Sheet for Healthcare Providers: SeriousBroker.it  This test is not yet approved or cleared by the [place]  FDA and has been authorized for detection and/or diagnosis of SARS-CoV-2 by FDA under an Emergency Use Authorization (EUA). This EUA will remain in effect (meaning this test can be used) for the duration of the COVID-19 declaration under Section 564(b)(1) of the Act, 21 U.S.C. section 360bbb-3(b)(1), unless the authorization is terminated or revoked.     Resp Syncytial Virus by PCR NEGATIVE NEGATIVE Final    Comment: (NOTE) Fact Sheet for Patients: BloggerCourse.com  Fact Sheet for Healthcare Providers: SeriousBroker.it  This test is not yet approved or cleared by the [place]  FDA and has been  authorized for detection and/or diagnosis of SARS-CoV-2 by FDA under an Emergency Use Authorization (EUA). This EUA will remain in effect (meaning this test can be used) for the duration of the COVID-19 declaration under Section 564(b)(1) of the Act, 21 U.S.C. section 360bbb-3(b)(1), unless the authorization is terminated or revoked.  Performed at [facility], [address] [place], [place] [address]      Labs: BNP (last 3 results) Recent Labs    06/16/23 1518  BNP 209.6*   Basic Metabolic Panel: Recent Labs  Lab 11/06/23 1343 11/07/23 0459 11/08/23 0450 11/09/23 0358 11/10/23 0441  NA 138 135 137 137 135  K 4.2 4.3 5.1 4.4 5.1  CL 98 97* 97* 94* 94*  CO2 28 26 24 27 27   GLUCOSE 104* 100* 78 70 75  BUN 49* 35* 47* 38* 49*  CREATININE 8.21* 6.09* 7.37* 5.92* 7.12*  CALCIUM  8.6* 8.5* 8.4* 8.8* 8.8*  PHOS 6.5*  --   --   --   --    Liver Function Tests: Recent Labs  Lab 11/06/23 1343  ALBUMIN  2.6*   No results for input(s): LIPASE, AMYLASE in the last 168 hours. No results for input(s): AMMONIA in the last 168 hours. CBC: Recent Labs  Lab 11/05/23 1248 11/06/23 1343 11/07/23 0459 11/08/23 0450 11/09/23 0358 11/10/23 0441  WBC 8.3 6.1 5.0 7.6 6.4 5.4  NEUTROABS 6.7  --  3.8 5.9 4.9 4.4  HGB 15.7 13.2 13.0 13.2 12.9* 13.2  HCT 47.3 39.0 39.5 39.8 39.2 39.1  MCV 99.2 98.5 97.8 98.8 97.8 97.0  PLT 212 198 181 192 198 211   Cardiac Enzymes: No results for input(s): CKTOTAL, CKMB, CKMBINDEX, TROPONINI in the last 168 hours. BNP: Invalid input(s): POCBNP CBG: Recent Labs  Lab 11/08/23 2214  GLUCAP 127*   D-Dimer No results for input(s): DDIMER in the last 72 hours. Hgb A1c No results for input(s): HGBA1C in the last 72 hours. Lipid Profile No results for input(s): CHOL, HDL, LDLCALC, TRIG, CHOLHDL, LDLDIRECT in the last 72 hours. Thyroid  function studies No results for input(s): TSH, T4TOTAL,  T3FREE, THYROIDAB in the last 72 hours.  Invalid input(s): FREET3 Anemia work up No results for input(s): VITAMINB12, FOLATE, FERRITIN, TIBC, IRON , RETICCTPCT in the last 72 hours. Urinalysis  Component Value Date/Time   COLORURINE YELLOW (A) 05/17/2015 0012   APPEARANCEUR HAZY (A) 05/17/2015 0012   LABSPEC 1.009 05/17/2015 0012   PHURINE 7.0 05/17/2015 0012   GLUCOSEU NEGATIVE 05/17/2015 0012   HGBUR 1+ (A) 05/17/2015 0012   BILIRUBINUR NEGATIVE 05/17/2015 0012   KETONESUR NEGATIVE 05/17/2015 0012   PROTEINUR 30 (A) 05/17/2015 0012   NITRITE NEGATIVE 05/17/2015 0012   LEUKOCYTESUR 3+ (A) 05/17/2015 0012   Sepsis Labs Recent Labs  Lab 11/07/23 0459 11/08/23 0450 11/09/23 0358 11/10/23 0441  WBC 5.0 7.6 6.4 5.4   Microbiology Recent Results (from the past 240 hours)  Resp panel by RT-PCR (RSV, Flu A&B, Covid) Anterior Nasal Swab     Status: None   Collection Time: 11/05/23 12:48 PM   Specimen: Anterior Nasal Swab  Result Value Ref Range Status   SARS Coronavirus 2 by RT PCR NEGATIVE NEGATIVE Final    Comment: (NOTE) SARS-CoV-2 target nucleic acids are NOT DETECTED.  The SARS-CoV-2 RNA is generally detectable in upper respiratory specimens during the acute phase of infection. The lowest concentration of SARS-CoV-2 viral copies this assay can detect is 138 copies/mL. A negative result does not preclude SARS-Cov-2 infection and should not be used as the sole basis for treatment or other patient management decisions. A negative result may occur with  improper specimen collection/handling, submission of specimen other than nasopharyngeal swab, presence of viral mutation(s) within the areas targeted by this assay, and inadequate number of viral copies(<138 copies/mL). A negative result must be combined with clinical observations, patient history, and epidemiological information. The expected result is Negative.  Fact Sheet for Patients:   BloggerCourse.com  Fact Sheet for Healthcare Providers:  SeriousBroker.it  This test is no t yet approved or cleared by the [place]  FDA and  has been authorized for detection and/or diagnosis of SARS-CoV-2 by FDA under an Emergency Use Authorization (EUA). This EUA will remain  in effect (meaning this test can be used) for the duration of the COVID-19 declaration under Section 564(b)(1) of the Act, 21 U.S.C.section 360bbb-3(b)(1), unless the authorization is terminated  or revoked sooner.       Influenza A by PCR NEGATIVE NEGATIVE Final   Influenza B by PCR NEGATIVE NEGATIVE Final    Comment: (NOTE) The Xpert Xpress SARS-CoV-2/FLU/RSV plus assay is intended as an aid in the diagnosis of influenza from Nasopharyngeal swab specimens and should not be used as a sole basis for treatment. Nasal washings and aspirates are unacceptable for Xpert Xpress SARS-CoV-2/FLU/RSV testing.  Fact Sheet for Patients: BloggerCourse.com  Fact Sheet for Healthcare Providers: SeriousBroker.it  This test is not yet approved or cleared by the [place]  FDA and has been authorized for detection and/or diagnosis of SARS-CoV-2 by FDA under an Emergency Use Authorization (EUA). This EUA will remain in effect (meaning this test can be used) for the duration of the COVID-19 declaration under Section 564(b)(1) of the Act, 21 U.S.C. section 360bbb-3(b)(1), unless the authorization is terminated or revoked.     Resp Syncytial Virus by PCR NEGATIVE NEGATIVE Final    Comment: (NOTE) Fact Sheet for Patients: BloggerCourse.com  Fact Sheet for Healthcare Providers: SeriousBroker.it  This test is not yet approved or cleared by the [place]  FDA and has been authorized for detection and/or diagnosis of SARS-CoV-2 by FDA under an Emergency Use  Authorization (EUA). This EUA will remain in effect (meaning this test can be used) for the duration of the COVID-19 declaration under Section 564(b)(1) of the Act,  21 U.S.C. section 360bbb-3(b)(1), unless the authorization is terminated or revoked.  Performed at [facility], [address] [place], [place] [address]    Imaging CT Chest Wo Contrast Result Date: 11/05/2023 CLINICAL DATA:  Shortness of breath and hypoxia. Possible aspiration with pneumonia not excluded on a portable chest radiograph earlier today. There was a continued mass-like opacity in the right lung suspicious for underlying malignancy. EXAM: CT CHEST WITHOUT CONTRAST TECHNIQUE: Multidetector CT imaging of the chest was performed following the standard protocol without IV contrast. RADIATION DOSE REDUCTION: This exam was performed according to the departmental dose-optimization program which includes automated exposure control, adjustment of the mA and/or kV according to patient size and/or use of iterative reconstruction technique. COMPARISON:  Chest CT dated 10/13/2023. Portable chest radiographs obtained earlier today and on 10/13/2023. FINDINGS: Cardiovascular: Mild atheromatous calcifications, including the coronary arteries and aorta. Stable mildly enlarged heart. Slight increase in size of a small pericardial effusion with a maximum thickness of 6 mm. The central pulmonary arteries remain enlarged with a main pulmonary artery diameter of 3.9 cm. Mediastinum/Nodes: Enlarged right paratracheal lymph nodes are again demonstrated. A previously demonstrated index 12 mm short axis node has a short axis diameter of 10 mm today. Unremarkable thyroid  gland, trachea and esophagus. A small hiatal hernia is again noted. Lungs/Pleura: Again demonstrated is dense consolidation in the lower lobes with air bronchograms and a more confluent area of density in the right lower lobe suggesting a mass. This includes a hilar component  encasing the proximal right upper lobe, right lower lobe and bronchus intermedius with diffuse narrowing of those bronchi, without significant change. The combined hilar and right lower lobe mass-like component currently measures 10.9 x 7.9 cm on image number 86/2, previously 9.5 x 6.8 cm. The previously demonstrated 12 mm right lower lobe nodule currently measures 13 mm in maximum diameter on image number 81/4. No new nodules seen. Stable mild changes of centrilobular emphysema throughout both lungs. Moderate-sized pleural effusion with probable partial loculations has increased mildly in size. No significant change in a small left pleural effusion. Upper Abdomen: A moderate to large amount of ascites is again demonstrated with enlarged lateral segment left lobe and caudate lobes of the liver suggesting possible cirrhosis of the liver. The spleen remains normal in size. Both kidneys remain small and contain multiple incompletely evaluated cysts not needing specific imaging follow-up at this time as well as a 2 mm calculus in each kidney. Musculoskeletal: Mild thoracic and moderate lower cervical spine degenerative changes. Stable healed mid sternal fracture. IMPRESSION: 1. Stable dense consolidation in the lower lobes with air bronchograms and a more confluent area of density in the right lower lobe suggesting a mass. This includes a hilar component encasing the proximal right upper lobe, right lower lobe and bronchus intermedius with diffuse narrowing of those bronchi. The combined hilar and right lower lobe mass-like component has increased mildly in size, currently measuring 10.9 x 7.9 cm. 2. Mild increase in size of a moderate-sized right pleural effusion with probable partial loculations. 3. No significant change in a small left pleural effusion. 4. Slight increase in size of a small pericardial effusion. 5. Stable to slightly improved mild right paratracheal adenopathy. This could be metastatic or reactive in  nature. 6. Stable mild changes of centrilobular emphysema throughout both lungs. 7. Stable enlargement of the central pulmonary arteries, suggesting pulmonary arterial hypertension. 8. Stable moderate to large amount of ascites in the upper abdomen with liver findings suggesting possible  cirrhosis of the liver. 9. Stable bilateral renal atrophy with multiple incompletely evaluated cysts not needing specific imaging follow-up at this time as well as a 2 mm calculus in each kidney. 10. Stable small hiatal hernia. Aortic Atherosclerosis (ICD10-I70.0) and Emphysema (ICD10-J43.9). Electronically Signed   By: Catherin Closs M.D.   On: 11/05/2023 16:01   DG Chest Portable 1 View Result Date: 11/05/2023 CLINICAL DATA:  Shortness of breath. EXAM: PORTABLE CHEST 1 VIEW COMPARISON:  10/13/2023. FINDINGS: Stable cardiomediastinal silhouette. Similar round lobular right mid to lower lobe opacity, corresponding to area of masslike opacity noted in the right lung on the prior CT chest dated 10/13/2023. Persistent right basilar atelectasis with small right effusion. Similar streaky opacities in the left lung with left lower lobe nodular appearing opacity, which may correspond to the previously noted left lower lobe nodule on the prior CT chest 10/13/2023. No pneumothorax. No acute osseous abnormality. IMPRESSION: 1. Similar round lobular right mid to lower lobe opacity, corresponding to area of reported masslike opacity in the right lung on the prior CT chest dated 10/13/2023, concerning for underlying malignancy. Superimposed infiltrate or edema cannot be excluded. 2. Similar streaky opacities in the left lung, which could reflect infiltrate or edema, with left lower lobe nodular appearing opacity, which may correspond to the previously noted left lower lobe nodule on the prior CT chest. Electronically Signed   By: Mannie Seek M.D.   On: 11/05/2023 13:17      Time coordinating discharge: over 30  minutes  SIGNED:  Kytzia Gienger DO [facility]

## 2023-11-18 ENCOUNTER — Other Ambulatory Visit: Payer: Self-pay

## 2023-11-18 ENCOUNTER — Emergency Department: Payer: MEDICAID

## 2023-11-18 ENCOUNTER — Observation Stay
Admission: EM | Admit: 2023-11-18 | Discharge: 2023-11-20 | Payer: MEDICAID | Attending: Internal Medicine | Admitting: Internal Medicine

## 2023-11-18 DIAGNOSIS — I2510 Atherosclerotic heart disease of native coronary artery without angina pectoris: Secondary | ICD-10-CM | POA: Diagnosis not present

## 2023-11-18 DIAGNOSIS — T829XXA Unspecified complication of cardiac and vascular prosthetic device, implant and graft, initial encounter: Secondary | ICD-10-CM | POA: Diagnosis present

## 2023-11-18 DIAGNOSIS — H543 Unqualified visual loss, both eyes: Secondary | ICD-10-CM | POA: Diagnosis present

## 2023-11-18 DIAGNOSIS — J449 Chronic obstructive pulmonary disease, unspecified: Secondary | ICD-10-CM | POA: Diagnosis not present

## 2023-11-18 DIAGNOSIS — J9611 Chronic respiratory failure with hypoxia: Secondary | ICD-10-CM | POA: Insufficient documentation

## 2023-11-18 DIAGNOSIS — N186 End stage renal disease: Secondary | ICD-10-CM | POA: Insufficient documentation

## 2023-11-18 DIAGNOSIS — R531 Weakness: Secondary | ICD-10-CM | POA: Diagnosis present

## 2023-11-18 DIAGNOSIS — Z992 Dependence on renal dialysis: Secondary | ICD-10-CM | POA: Diagnosis not present

## 2023-11-18 DIAGNOSIS — Z86718 Personal history of other venous thrombosis and embolism: Secondary | ICD-10-CM | POA: Insufficient documentation

## 2023-11-18 DIAGNOSIS — E039 Hypothyroidism, unspecified: Secondary | ICD-10-CM | POA: Diagnosis present

## 2023-11-18 DIAGNOSIS — R188 Other ascites: Secondary | ICD-10-CM | POA: Diagnosis not present

## 2023-11-18 DIAGNOSIS — E785 Hyperlipidemia, unspecified: Secondary | ICD-10-CM | POA: Diagnosis present

## 2023-11-18 DIAGNOSIS — R918 Other nonspecific abnormal finding of lung field: Secondary | ICD-10-CM | POA: Diagnosis not present

## 2023-11-18 DIAGNOSIS — T8249XA Other complication of vascular dialysis catheter, initial encounter: Principal | ICD-10-CM | POA: Insufficient documentation

## 2023-11-18 DIAGNOSIS — I10 Essential (primary) hypertension: Secondary | ICD-10-CM | POA: Diagnosis present

## 2023-11-18 DIAGNOSIS — I120 Hypertensive chronic kidney disease with stage 5 chronic kidney disease or end stage renal disease: Secondary | ICD-10-CM | POA: Insufficient documentation

## 2023-11-18 DIAGNOSIS — F209 Schizophrenia, unspecified: Secondary | ICD-10-CM | POA: Diagnosis present

## 2023-11-18 LAB — COMPREHENSIVE METABOLIC PANEL WITH GFR
ALT: 13 U/L (ref 0–44)
AST: 20 U/L (ref 15–41)
Albumin: 2.8 g/dL — ABNORMAL LOW (ref 3.5–5.0)
Alkaline Phosphatase: 52 U/L (ref 38–126)
Anion gap: 20 — ABNORMAL HIGH (ref 5–15)
BUN: 66 mg/dL — ABNORMAL HIGH (ref 8–23)
CO2: 23 mmol/L (ref 22–32)
Calcium: 9 mg/dL (ref 8.9–10.3)
Chloride: 96 mmol/L — ABNORMAL LOW (ref 98–111)
Creatinine, Ser: 8.76 mg/dL — ABNORMAL HIGH (ref 0.61–1.24)
GFR, Estimated: 6 mL/min — ABNORMAL LOW (ref >60)
Glucose, Bld: 94 mg/dL (ref 70–99)
Potassium: 4.8 mmol/L (ref 3.5–5.1)
Sodium: 139 mmol/L (ref 135–145)
Total Bilirubin: 0.8 mg/dL (ref 0.0–1.2)
Total Protein: 7.1 g/dL (ref 6.5–8.1)

## 2023-11-18 LAB — CBC WITH DIFFERENTIAL/PLATELET
Abs Immature Granulocytes: 0.05 10*3/uL (ref 0.00–0.07)
Basophils Absolute: 0 10*3/uL (ref 0.0–0.1)
Basophils Relative: 0 %
Eosinophils Absolute: 0.1 10*3/uL (ref 0.0–0.5)
Eosinophils Relative: 1 %
HCT: 45.5 % (ref 39.0–52.0)
Hemoglobin: 14.9 g/dL (ref 13.0–17.0)
Immature Granulocytes: 1 %
Lymphocytes Relative: 12 %
Lymphs Abs: 0.9 10*3/uL (ref 0.7–4.0)
MCH: 32.1 pg (ref 26.0–34.0)
MCHC: 32.7 g/dL (ref 30.0–36.0)
MCV: 98.1 fL (ref 80.0–100.0)
Monocytes Absolute: 0.8 10*3/uL (ref 0.1–1.0)
Monocytes Relative: 10 %
Neutro Abs: 5.7 10*3/uL (ref 1.7–7.7)
Neutrophils Relative %: 76 %
Platelets: 240 10*3/uL (ref 150–400)
RBC: 4.64 MIL/uL (ref 4.22–5.81)
RDW: 13.8 % (ref 11.5–15.5)
WBC: 7.6 10*3/uL (ref 4.0–10.5)
nRBC: 0 % (ref 0.0–0.2)

## 2023-11-18 LAB — MAGNESIUM: Magnesium: 2.6 mg/dL — ABNORMAL HIGH (ref 1.7–2.4)

## 2023-11-18 LAB — PHOSPHORUS: Phosphorus: 8 mg/dL — ABNORMAL HIGH (ref 2.5–4.6)

## 2023-11-18 LAB — HEPATITIS B SURFACE ANTIGEN: Hepatitis B Surface Ag: NONREACTIVE

## 2023-11-18 MED ORDER — LORAZEPAM 1 MG PO TABS
1.0000 mg | ORAL_TABLET | Freq: Three times a day (TID) | ORAL | Status: DC | PRN
  Administered 2023-11-19: 1 mg via ORAL
  Filled 2023-11-18: qty 1

## 2023-11-18 MED ORDER — MELATONIN 5 MG PO TABS: 2.5000 mg | ORAL_TABLET | Freq: Every evening | ORAL | Status: DC | PRN

## 2023-11-18 MED ORDER — ONDANSETRON HCL 4 MG PO TABS: 4.0000 mg | ORAL_TABLET | Freq: Four times a day (QID) | ORAL | Status: DC | PRN

## 2023-11-18 MED ORDER — ISOSORBIDE MONONITRATE ER 30 MG PO TB24
60.0000 mg | ORAL_TABLET | Freq: Every day | ORAL | Status: DC
  Administered 2023-11-19: 60 mg via ORAL
  Filled 2023-11-18: qty 2

## 2023-11-18 MED ORDER — FLUTICASONE FUROATE-VILANTEROL 200-25 MCG/ACT IN AEPB
1.0000 | INHALATION_SPRAY | Freq: Every day | RESPIRATORY_TRACT | Status: DC
  Administered 2023-11-19: 1 via RESPIRATORY_TRACT
  Filled 2023-11-18 (×2): qty 28

## 2023-11-18 MED ORDER — ROSUVASTATIN CALCIUM 10 MG PO TABS
10.0000 mg | ORAL_TABLET | Freq: Every day | ORAL | Status: DC
  Filled 2023-11-18 (×3): qty 1

## 2023-11-18 MED ORDER — RENA-VITE PO TABS
1.0000 | ORAL_TABLET | Freq: Every day | ORAL | Status: DC
  Filled 2023-11-18 (×3): qty 1

## 2023-11-18 MED ORDER — LEVOTHYROXINE SODIUM 50 MCG PO TABS
125.0000 ug | ORAL_TABLET | Freq: Every day | ORAL | Status: DC
  Administered 2023-11-19: 125 ug via ORAL
  Filled 2023-11-18: qty 1

## 2023-11-18 MED ORDER — HEPARIN SODIUM (PORCINE) 5000 UNIT/ML IJ SOLN
5000.0000 [IU] | Freq: Three times a day (TID) | INTRAMUSCULAR | Status: DC
  Administered 2023-11-18 – 2023-11-19 (×3): 5000 [IU] via SUBCUTANEOUS
  Filled 2023-11-18 (×4): qty 1

## 2023-11-18 MED ORDER — CARBAMAZEPINE 200 MG PO TABS
1000.0000 mg | ORAL_TABLET | Freq: Every morning | ORAL | Status: DC
  Administered 2023-11-19: 1000 mg via ORAL
  Filled 2023-11-18: qty 5

## 2023-11-18 MED ORDER — ACETAMINOPHEN 325 MG PO TABS
650.0000 mg | ORAL_TABLET | Freq: Four times a day (QID) | ORAL | Status: DC | PRN
  Administered 2023-11-19: 650 mg via ORAL
  Filled 2023-11-18: qty 2

## 2023-11-18 MED ORDER — POLYETHYLENE GLYCOL 3350 17 G PO PACK: 17.0000 g | PACK | Freq: Every day | ORAL | Status: DC | PRN

## 2023-11-18 MED ORDER — PANTOPRAZOLE SODIUM 40 MG PO TBEC
40.0000 mg | DELAYED_RELEASE_TABLET | Freq: Every day | ORAL | Status: DC
  Administered 2023-11-18 – 2023-11-19 (×2): 40 mg via ORAL
  Filled 2023-11-18 (×2): qty 1

## 2023-11-18 MED ORDER — ONDANSETRON HCL 4 MG/2ML IJ SOLN: 4.0000 mg | Freq: Four times a day (QID) | INTRAMUSCULAR | Status: DC | PRN

## 2023-11-18 MED ORDER — ALBUTEROL SULFATE (2.5 MG/3ML) 0.083% IN NEBU: 2.5000 mg | INHALATION_SOLUTION | RESPIRATORY_TRACT | Status: DC | PRN

## 2023-11-18 MED ORDER — ACETAMINOPHEN 650 MG RE SUPP: 650.0000 mg | Freq: Four times a day (QID) | RECTAL | Status: DC | PRN

## 2023-11-18 MED ORDER — FERROUS SULFATE 325 (65 FE) MG PO TABS
325.0000 mg | ORAL_TABLET | Freq: Every day | ORAL | Status: DC
  Administered 2023-11-18: 325 mg via ORAL
  Filled 2023-11-18: qty 1

## 2023-11-18 MED ORDER — CHLORHEXIDINE GLUCONATE CLOTH 2 % EX PADS
6.0000 | MEDICATED_PAD | Freq: Every day | CUTANEOUS | Status: DC
  Administered 2023-11-19: 6 via TOPICAL

## 2023-11-18 NOTE — ED Triage Notes (Signed)
 Pt here because of missing dialysis yesterday. Pt denies pain. Pt unsure of why he is here. Pt is very hard to understand. Pt denies complaints at this time.

## 2023-11-18 NOTE — H&P (Signed)
 History and Physical    Todd Brown UJW:[id number] DOB: 1960/08/27 DOA: 11/18/2023  DOS: the patient was seen and examined on 11/18/2023  PCP: Janann Meadow, NP   Patient coming from: [facility]  I have personally briefly reviewed patient's old medical records in [facility]  HPI:   Todd Brown is a 63 y.o. year old male with past medical history of ESRD on HD (MWF), schizophrenia, hypertension, hyperlipidemia, COPD with chronic hypoxic respiratory failure on 2 L, CAD, recurrent ascites requiring paracentesis, hypothyroidism, and blindness.  He presents to the ED today because unable to be dialyzed yesterday secondary to the dialysis center unable to get him out of the chair stating that they needed a [facility] lift.  Per guardian the dialysis center has obtained a [facility] lift today and should be able to dialyze him going forward. Otherwise patient has no complaints.   ED Course: On arrival to [facility] regional ED patient was noted to be afebrile temp 36.7 C, BP 126/80, HR 80, RR 22, SpO2 95% on 3 L via nasal cannula.  CXR obtained and shows moderate right pleural effusion, (R) hilar opacity corresponding with suspected mass seen on CT, progressive arthritis he is at (R) base.  Labs notable for creatinine 8.76, BUN 66, albumin  2.8, anion gap 20. TRH contacted for admission.  Review of Systems: As mentioned in the history of present illness. All other systems reviewed and are negative.  Past Medical History:  Diagnosis Date   Anemia    Arthritis    Chronic respiratory failure (HCC)    COPD (chronic obstructive pulmonary disease) (HCC)    Dialysis patient (HCC)    Mon. 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   ESRD (end stage renal disease) (HCC)    ESRD (end stage renal disease) on dialysis (HCC)    Hyperlipidemia    Hypertension    Malignant hypertension    Pneumonia 2016   Renal artery stenosis (HCC)     Schizophrenia (HCC)     Past Surgical History:  Procedure Laterality Date   A/V FISTULAGRAM N/A 08/06/2016   Procedure: A/V Fistulagram;  Surgeon: Celso College, MD;  Location: [facility] INVASIVE CV LAB;  Service: Cardiovascular;  Laterality: N/A;   A/V FISTULAGRAM N/A 02/14/2021   Procedure: A/V FISTULAGRAM poss Perm Cath Insertion;  Surgeon: Jackquelyn Mass, MD;  Location: [facility] INVASIVE CV LAB;  Service: Cardiovascular;  Laterality: N/A;   A/V FISTULAGRAM Left 08/26/2021   Procedure: A/V Fistulagram;  Surgeon: Celso College, MD;  Location: [facility] INVASIVE CV LAB;  Service: Cardiovascular;  Laterality: Left;   A/V SHUNT INTERVENTION N/A 08/06/2016   Procedure: A/V Shunt Intervention;  Surgeon: Celso College, MD;  Location: [facility] INVASIVE CV LAB;  Service: Cardiovascular;  Laterality: N/A;   AV FISTULA PLACEMENT Left 12/26/2014   Procedure: ARTERIOVENOUS (AV) FISTULA CREATION;  Surgeon: Celso College, MD;  Location: [facility] ORS;  Service: Vascular;  Laterality: Left;   AV FISTULA PLACEMENT     ESOPHAGOGASTRODUODENOSCOPY (EGD) WITH PROPOFOL  N/A 05/06/2017   Procedure: ESOPHAGOGASTRODUODENOSCOPY (EGD) WITH PROPOFOL ;  Surgeon: Luke Salaam, MD;  Location: [facility] ENDOSCOPY;  Service: Gastroenterology;  Laterality: N/A;   INSERTION OF DIALYSIS CATHETER Right    PERIPHERAL VASCULAR CATHETERIZATION N/A 10/22/2014   Procedure: Dialysis/Perma Catheter Insertion;  Surgeon: Celso College, MD;  Location: [facility] INVASIVE CV LAB;  Service: Cardiovascular;  Laterality: N/A;   PERIPHERAL VASCULAR CATHETERIZATION  N/A 02/28/2015   Procedure: Dialysis/Perma Catheter Removal;  Surgeon: Celso College, MD;  Location: [facility] INVASIVE CV LAB;  Service: Cardiovascular;  Laterality: N/A;   Repair fx left lower leg     yrs ago (age 7)   REVISON OF ARTERIOVENOUS FISTULA Left 03/27/2022   Procedure: REVISON OF ARTERIOVENOUS FISTULA (JUMP GRAFT);  Surgeon: Jackquelyn Mass, MD;  Location: [facility] ORS;  Service: Vascular;  Laterality: Left;   TEE  WITHOUT CARDIOVERSION N/A 09/21/2022   Procedure: TRANSESOPHAGEAL ECHOCARDIOGRAM;  Surgeon: Constancia Delton, MD;  Location: [facility] ORS;  Service: Cardiovascular;  Laterality: N/A;   TONSILLECTOMY       reports that he has never smoked. He has never been exposed to tobacco smoke. He has never used smokeless tobacco. He reports that he does not currently use alcohol. He reports that he does not currently use drugs after having used the following drugs: Marijuana.  Allergies  Allergen Reactions   Chlorpromazine Other (See Comments)    Reaction:  Unknown , pt states it makes him feel real bad Reaction:  Unknown , pt states it makes him feel real bad     Family History  Problem Relation Age of Onset   Kidney disease Brother    Diabetes Neg Hx     Prior to Admission medications   Medication Sig Start Date End Date Taking? Authorizing Provider  albuterol  (VENTOLIN  HFA) 108 (90 Base) MCG/ACT inhaler Inhale 1-2 puffs into the lungs every 4 (four) hours as needed for wheezing or shortness of breath.    [provider]  ascorbic acid  (VITAMIN C ) 500 MG tablet Take 500 mg by mouth daily.    [provider]  b complex-vitamin c -folic acid  (NEPHRO-VITE) 0.8 MG TABS tablet Take 1 tablet by mouth at bedtime.    [provider]  benzonatate  (TESSALON ) 100 MG capsule Take 100 mg by mouth every 6 (six) hours as needed. 09/28/23   [provider]  carbamazepine  (TEGRETOL ) 200 MG tablet Take 1,000 mg by mouth every morning.    [provider]  cholecalciferol  (VITAMIN D ) 1000 UNITS tablet Take 1,000 Units by mouth in the morning.    [provider]  ferrous sulfate  325 (65 FE) MG tablet Take 1 tablet (325 mg total) by mouth daily. 05/19/15   Patel, Sona, MD  Fluticasone -Salmeterol (ADVAIR) 250-50 MCG/DOSE AEPB Inhale 1 puff into the lungs 2 (two) times daily.    [provider]  hyoscyamine  (LEVSIN  SL) 0.125 MG SL tablet Place 0.125 mg under the  tongue every 4 (four) hours as needed for cramping.    [provider]  ipratropium-albuterol  (DUONEB) 0.5-2.5 (3) MG/3ML SOLN Take 3 mLs by nebulization every 6 (six) hours as needed. 05/04/23   Alexander, Natalie, DO  isosorbide  mononitrate (IMDUR ) 60 MG 24 hr tablet Take 1 tablet (60 mg total) by mouth daily. 06/19/23   Brenna Cam, MD  levothyroxine  (SYNTHROID ) 125 MCG tablet Take 1 tablet (125 mcg total) by mouth daily. 11/08/22 11/05/23  Verla Glaze, MD  LORazepam  (ATIVAN ) 1 MG tablet Take 1 tablet (1 mg total) by mouth every 8 (eight) hours as needed for anxiety. 11/10/23   Alexander, Natalie, DO  nicotine  (NICODERM CQ  - DOSED IN MG/24 HOURS) 21 mg/24hr patch Place 1 patch (21 mg total) onto the skin daily. 05/05/23   Alexander, Natalie, DO  omeprazole  (PRILOSEC) 40 MG capsule Take 40 mg by mouth every morning.    [provider]  polyethylene glycol (MIRALAX  / GLYCOLAX ) 17  g packet Take 17 g by mouth daily as needed for mild constipation.    [provider]  rosuvastatin  (CRESTOR ) 10 MG tablet Take 10 mg by mouth at bedtime. 04/19/23   [provider]  senna-docusate (SENOKOT-S) 8.6-50 MG tablet Take 1 tablet by mouth 2 (two) times daily as needed for mild constipation.    [provider]  traMADol  (ULTRAM ) 50 MG tablet Take 1 tablet (50 mg total) by mouth every 6 (six) hours as needed for moderate pain (pain score 4-6). 11/10/23   Alexander, Natalie, DO  trihexyphenidyl  (ARTANE ) 2 MG tablet Take 2 mg by mouth 2 (two) times daily.    [provider]  zinc  sulfate 220 (50 Zn) MG capsule Take 220 mg by mouth daily.    [provider]  ziprasidone  (GEODON ) 20 MG capsule Take 1 capsule (20 mg total) by mouth 2 (two) times daily with a meal. 11/10/23   Melodi Sprung, DO    Physical Exam: Vitals:   11/18/23 1251 11/18/23 1252  BP: 126/80   Pulse: 88   Resp: (!) 22   Temp: 98.1 F (36.7 C)   TempSrc: Oral   SpO2: 95%   Weight:   66.2 kg  Height:  6' 3 (1.905 m)    Physical Exam   Constitutional: Appearing elderly African-American gentleman, NAD Respiratory: Decreased breath sounds in the right, clear to auscultation on (L), no wheezing, no crackles. Normal respiratory effort. No accessory muscle use.  Cardiovascular: Regular rate and rhythm, no murmurs / rubs / gallops. No extremity edema. 2+ pedal pulses. No carotid bruits.  Abdomen: Distended. Bowel sounds positive x4 quadrants.  Skin: Warm, dry Musculoskeletal: Moves all extremities, full range of motion Neurologic: Alert, dysarthric speech, edentulous    Labs on Admission: I have personally reviewed following labs and imaging studies  CBC: Recent Labs  Lab 11/18/23 1255  WBC 7.6  NEUTROABS 5.7  HGB 14.9  HCT 45.5  MCV 98.1  PLT 240   Basic Metabolic Panel: Recent Labs  Lab 11/18/23 1255  NA 139  K 4.8  CL 96*  CO2 23  GLUCOSE 94  BUN 66*  CREATININE 8.76*  CALCIUM  9.0   GFR: Estimated Creatinine Clearance: 8.1 mL/min (A) (by C-G formula based on SCr of 8.76 mg/dL (H)). Liver Function Tests: Recent Labs  Lab 11/18/23 1255  AST 20  ALT 13  ALKPHOS 52  BILITOT 0.8  PROT 7.1  ALBUMIN  2.8*   No results for input(s): LIPASE, AMYLASE in the last 168 hours. No results for input(s): AMMONIA in the last 168 hours. Coagulation Profile: No results for input(s): INR, PROTIME in the last 168 hours. Cardiac Enzymes: No results for input(s): CKTOTAL, CKMB, CKMBINDEX, TROPONINI, TROPONINIHS in the last 168 hours. BNP (last 3 results) Recent Labs    06/16/23 1518  BNP 209.6*   HbA1C: No results for input(s): HGBA1C in the last 72 hours. CBG: No results for input(s): GLUCAP in the last 168 hours. Lipid Profile: No results for input(s): CHOL, HDL, LDLCALC, TRIG, CHOLHDL, LDLDIRECT in the last 72 hours. Thyroid  Function Tests: No results for input(s): TSH, T4TOTAL, FREET4, T3FREE,  THYROIDAB in the last 72 hours. Anemia Panel: No results for input(s): VITAMINB12, FOLATE, FERRITIN, TIBC, IRON , RETICCTPCT in the last 72 hours. Urine analysis:    Component Value Date/Time   COLORURINE YELLOW (A) 05/17/2015 0012   APPEARANCEUR HAZY (A) 05/17/2015 0012   LABSPEC 1.009 05/17/2015 0012   PHURINE 7.0 05/17/2015 0012   GLUCOSEU  NEGATIVE 05/17/2015 0012   HGBUR 1+ (A) 05/17/2015 0012   BILIRUBINUR NEGATIVE 05/17/2015 0012   KETONESUR NEGATIVE 05/17/2015 0012   PROTEINUR 30 (A) 05/17/2015 0012   NITRITE NEGATIVE 05/17/2015 0012   LEUKOCYTESUR 3+ (A) 05/17/2015 0012    Radiological Exams on Admission: I have personally reviewed images No results found.   Assessment and Plan: #End-stage renal disease on hemodialysis - Dr. Erminio Hazy with Nephrology consulted for iHD, appreciate his consultation and recommendations - Dialysis per nephrology  ##Ascites - Monitor, consider paracentesis prior to discharge  #Lung mass Seen on prior imaging strong suspicious for underlying malignancy however the patient and legal guardian declined further workup.  #COPD - Continue home Advair - As needed nebulizers  #Hypothyroidism - Continue home Synthroid   #Coronary artery disease - Continue home Crestor   #Hypertension - Continue home Imdur   #Schizophrenia - Continue home Tegretol   - As needed Ativan    VTE prophylaxis:  SQ Heparin   GI prophylaxis: Protonix  Diet: Dysphagia 2 Access: PIV Lines: None Code Status: DNR/DNI confirmed legal guardian Candice Gobble Telemetry: No Disposition: Observe on MedSurg  Admission status: Observation, Med-Surg Patient is from: [facility] Anticipated d/c is to: [facility] Anticipated d/c date is: 1 day Patient currently: Pending hemodialysis tonight  Family Communication: Legal guardian Candace Copple called and updated  Consults called: Dr. Rhesa Celeste with nephrology   Severity of Illness: The appropriate  patient status for this patient is OBSERVATION. Observation status is judged to be reasonable and necessary in order to provide the required intensity of service to ensure the patient's safety. The patient's presenting symptoms, physical exam findings, and initial radiographic and laboratory data in the context of their medical condition is felt to place them at decreased risk for further clinical deterioration. Furthermore, it is anticipated that the patient will be medically stable for discharge from the hospital within 2 midnights of admission.   To reach the provider On-Call:   7AM- 7PM see care teams to locate the attending and reach out to them via [email or web address] Password: [email or web address] 7PM-7AM contact night-coverage If you still have difficulty reaching the appropriate provider, please page the [facility] (Director on Call) for [facility] on amion for assistance  This document was prepared using Conservation officer, historic buildings and may include unintentional dictation errors.  Naida Austria FNP-BC, PMHNP-BC Nurse Practitioner [facility]

## 2023-11-18 NOTE — ED Notes (Signed)
 Received report from brandon rn.  Pt alert.  Pt on 3 liters oxygen  pt waiting on room to be cleaned.

## 2023-11-18 NOTE — ED Provider Notes (Signed)
 [facility] Provider Note    Event Date/Time   First MD Initiated Contact with Patient 11/18/23 1355     (approximate)   History   Weakness   HPI  Todd Brown is a 63 y.o. male  ESRD on HD (MWF), schizophrenia, hypertension, hyperlipidemia, COPD with chronic hypoxic respiratory failure on 2 L, CAD, recurrent ascites requiring paracentesis, hypothyroidism, blindness, who presents due to concerns for missing dialysis.  Patient last received dialysis   I reviewed the records and patient was seen on 11/05/2023 where patient was started on antibiotics for possible pneumonia although symptoms improved with dialysis.  Initially hypoxic but came down to home 2 L.  Does have an enlarging lung mass.  It appears according to notes that both patient and legal guardian have declined further workup.  Patient is having increasing weakness.  He required multiple people to get him over into the bed.  He denies any symptoms.     Physical Exam   Triage Vital Signs: ED Triage Vitals  Encounter Vitals Group     BP 11/18/23 1251 126/80     Girls Systolic BP Percentile --      Girls Diastolic BP Percentile --      Boys Systolic BP Percentile --      Boys Diastolic BP Percentile --      Pulse Rate 11/18/23 1251 88     Resp 11/18/23 1251 (!) 22     Temp 11/18/23 1251 98.1 F (36.7 C)     Temp Source 11/18/23 1251 Oral     SpO2 11/18/23 1251 95 %     Weight 11/18/23 1252 145 lb 15.1 oz (66.2 kg)     Height 11/18/23 1252 6' 3 (1.905 m)     Head Circumference --      Peak Flow --      Pain Score 11/18/23 1252 0     Pain Loc --      Pain Education --      Exclude from Growth Chart --     Most recent vital signs: Vitals:   11/18/23 1251  BP: 126/80  Pulse: 88  Resp: (!) 22  Temp: 98.1 F (36.7 C)  SpO2: 95%     General: Awake, no distress.  CV:  Good peripheral perfusion.  Resp:  Normal effort.  Abd:  No distention.  Other:  Fistula noted   ED  Results / Procedures / Treatments   Labs (all labs ordered are listed, but only abnormal results are displayed) Labs Reviewed  COMPREHENSIVE METABOLIC PANEL WITH GFR - Abnormal; Notable for the following components:      Result Value   Chloride 96 (*)    BUN 66 (*)    Creatinine, Ser 8.76 (*)    Albumin  2.8 (*)    GFR, Estimated 6 (*)    Anion gap 20 (*)    All other components within normal limits  CBC WITH DIFFERENTIAL/PLATELET  HEPATITIS B SURFACE ANTIGEN  HEPATITIS B SURFACE ANTIBODY, QUANTITATIVE  MAGNESIUM   PHOSPHORUS     EKG  My interpretation of EKG:  Normal sinus rate of 88 without any ST elevation or T wave inversions except in V2, left posterior fascicular block  RADIOLOGY I have reviewed the xray personally and interpreted patient has pleural effusion with known opacity, mass   PROCEDURES:  Critical Care performed: No  Procedures   MEDICATIONS ORDERED IN ED: Medications  Chlorhexidine  Gluconate Cloth 2 % PADS 6 each (has no  administration in time range)  heparin  injection 5,000 Units (has no administration in time range)  acetaminophen  (TYLENOL ) tablet 650 mg (has no administration in time range)    Or  acetaminophen  (TYLENOL ) suppository 650 mg (has no administration in time range)  polyethylene glycol (MIRALAX  / GLYCOLAX ) packet 17 g (has no administration in time range)  ondansetron  (ZOFRAN ) tablet 4 mg (has no administration in time range)    Or  ondansetron  (ZOFRAN ) injection 4 mg (has no administration in time range)  albuterol  (PROVENTIL ) (2.5 MG/3ML) 0.083% nebulizer solution 2.5 mg (has no administration in time range)  melatonin tablet 2.5 mg (has no administration in time range)  pantoprazole  (PROTONIX ) EC tablet 40 mg (40 mg Oral Given 11/18/23 1615)  fluticasone  furoate-vilanterol (BREO ELLIPTA ) 200-25 MCG/ACT 1 puff (has no administration in time range)  levothyroxine  (SYNTHROID ) tablet 125 mcg (has no administration in time range)   LORazepam  (ATIVAN ) tablet 1 mg (has no administration in time range)  carbamazepine  (TEGRETOL ) tablet 1,000 mg (has no administration in time range)  multivitamin (RENA-VIT) tablet 1 tablet (has no administration in time range)  ferrous sulfate  tablet 325 mg (has no administration in time range)  isosorbide  mononitrate (IMDUR ) 24 hr tablet 60 mg (has no administration in time range)     IMPRESSION / MDM / ASSESSMENT AND PLAN / ED COURSE  I reviewed the triage vital signs and the nursing notes.   Patient's presentation is most consistent with acute presentation with potential threat to life or bodily function.   Patient comes in with concerns for missed dialysis last dialyzed on Monday history of fluid overload.  Workup was done to evaluate for AKI.  CBC reassuring CMP shows BUN of 66 anion gap of 20  2:26 PM discussed with legal guardian candice- missed dialysis today.  Not acting any different. IN and out of hospital for SOB from known lung cancer. Getting palliatives services. At baseline  [place] [facility].   Attempted to call [place] [facility] multiple times no answer   Discussed with Dr. Rhesa Celeste who did want to admit patient for dialysis   The patient is on the cardiac monitor to evaluate for evidence of arrhythmia and/or significant heart rate changes.      FINAL CLINICAL IMPRESSION(S) / ED DIAGNOSES   Final diagnoses:  Weakness  ESRD (end stage renal disease) on dialysis [facility])     Rx / DC Orders   ED Discharge Orders     None        Note:  This document was prepared using Dragon voice recognition software and may include unintentional dictation errors.   Lubertha Rush, MD 11/18/23 [phone]

## 2023-11-18 NOTE — ED Notes (Signed)
 Report off to Charlett Conroy cpod nurse

## 2023-11-18 NOTE — Progress Notes (Signed)
 Unable to do admission assessment due to patient compliance.

## 2023-11-19 ENCOUNTER — Observation Stay: Payer: MEDICAID

## 2023-11-19 DIAGNOSIS — T829XXA Unspecified complication of cardiac and vascular prosthetic device, implant and graft, initial encounter: Secondary | ICD-10-CM | POA: Diagnosis not present

## 2023-11-19 LAB — BASIC METABOLIC PANEL WITH GFR
Anion gap: 16 — ABNORMAL HIGH (ref 5–15)
BUN: 44 mg/dL — ABNORMAL HIGH (ref 8–23)
CO2: 25 mmol/L (ref 22–32)
Calcium: 8.7 mg/dL — ABNORMAL LOW (ref 8.9–10.3)
Chloride: 96 mmol/L — ABNORMAL LOW (ref 98–111)
Creatinine, Ser: 6.43 mg/dL — ABNORMAL HIGH (ref 0.61–1.24)
GFR, Estimated: 9 mL/min — ABNORMAL LOW (ref >60)
Glucose, Bld: 74 mg/dL (ref 70–99)
Potassium: 4.4 mmol/L (ref 3.5–5.1)
Sodium: 137 mmol/L (ref 135–145)

## 2023-11-19 LAB — BODY FLUID CELL COUNT WITH DIFFERENTIAL
Eos, Fluid: 0 %
Lymphs, Fluid: 34 %
Monocyte-Macrophage-Serous Fluid: 63 %
Neutrophil Count, Fluid: 2 %
Other Cells, Fluid: 1 %
Total Nucleated Cell Count, Fluid: 502 uL

## 2023-11-19 LAB — GLUCOSE, PLEURAL OR PERITONEAL FLUID: Glucose, Fluid: 84 mg/dL

## 2023-11-19 LAB — CBC
HCT: 44.2 % (ref 39.0–52.0)
Hemoglobin: 14.6 g/dL (ref 13.0–17.0)
MCH: 32.4 pg (ref 26.0–34.0)
MCHC: 33 g/dL (ref 30.0–36.0)
MCV: 98 fL (ref 80.0–100.0)
Platelets: 206 10*3/uL (ref 150–400)
RBC: 4.51 MIL/uL (ref 4.22–5.81)
RDW: 13.5 % (ref 11.5–15.5)
WBC: 6.5 10*3/uL (ref 4.0–10.5)
nRBC: 0 % (ref 0.0–0.2)

## 2023-11-19 LAB — PROTEIN, PLEURAL OR PERITONEAL FLUID: Total protein, fluid: 3.7 g/dL

## 2023-11-19 LAB — PATHOLOGIST SMEAR REVIEW

## 2023-11-19 LAB — MAGNESIUM: Magnesium: 2.2 mg/dL (ref 1.7–2.4)

## 2023-11-19 MED ORDER — LIDOCAINE HCL (PF) 1 % IJ SOLN
10.0000 mL | Freq: Once | INTRAMUSCULAR | Status: AC
Start: 2023-11-19 — End: 2023-11-19
  Administered 2023-11-19: 10 mL via INTRADERMAL

## 2023-11-19 MED ORDER — LORAZEPAM 1 MG PO TABS: 1.0000 mg | ORAL_TABLET | Freq: Three times a day (TID) | ORAL | 0 refills | Status: DC | PRN

## 2023-11-19 MED ORDER — TRIHEXYPHENIDYL HCL 2 MG PO TABS: 2.0000 mg | ORAL_TABLET | Freq: Two times a day (BID) | ORAL | 0 refills | Status: DC

## 2023-11-19 MED ORDER — ALBUMIN HUMAN 25 % IV SOLN
25.0000 g | Freq: Once | INTRAVENOUS | Status: AC
  Administered 2023-11-19: 25 g via INTRAVENOUS
  Filled 2023-11-19: qty 100

## 2023-11-19 MED ORDER — ZIPRASIDONE HCL 20 MG PO CAPS: 20.0000 mg | ORAL_CAPSULE | Freq: Two times a day (BID) | ORAL | 0 refills | Status: DC

## 2023-11-19 NOTE — TOC Initial Note (Signed)
 Transition of Care [facility]) - Initial/Assessment Note    Patient Details  Name: Todd Brown MRN: [id number] Date of Birth: 1960/08/14  Transition of [facility]) CM/SW Contact:    Odilia Bennett, LCSW Phone Number: 11/19/2023, 10:15 AM  Clinical Narrative:  CSW called patient's legal guardian, [name], with [facility] DSS. She confirmed patient lives at [facility] and plan is for him to return at discharge. Per MD, likely discharge today after paracentesis. ALF owner, Marcello Sero, is aware. Discussed return via ALF staff vs EMS. RN will get patient up to see which form of transportation he would require. No further concerns. CSW will continue to follow patient for support and facilitate return to ALF when medically stable.                Expected Discharge Plan: Assisted Living Barriers to Discharge: Continued Medical Work up   Patient Goals and CMS Choice            Expected Discharge Plan and Services     Post Acute Care Choice: NA Living arrangements for the past 2 months: Assisted Living Facility                                      Prior Living Arrangements/Services Living arrangements for the past 2 months: Assisted Living Facility Lives with:: Facility Resident Patient language and need for interpreter reviewed:: Yes Do you feel safe going back to the place where you live?: Yes      Need for Family Participation in Patient Care: Yes (Comment) Care giver support system in place?: Yes (comment) Current home services: DME Criminal Activity/Legal Involvement Pertinent to Current Situation/Hospitalization: No - Comment as needed  Activities of Daily Living   ADL Screening (condition at time of admission) Is the patient deaf or have difficulty hearing?: Yes Does the patient have difficulty seeing, even when wearing glasses/contacts?: Yes Does the patient have difficulty concentrating, remembering, or making decisions?: Yes  Permission  Sought/Granted Permission sought to share information with : Facility Medical sales representative, Guardian    Share Information with NAME: [name]  Permission granted to share info w AGENCY: [name]  Permission granted to share info w Relationship: Legal Guardian  Permission granted to share info w Contact Information: [phone]  Emotional Assessment       Orientation: : Oriented to Self, Oriented to Place Alcohol / Substance Use: Not Applicable Psych Involvement: No (comment)  Admission diagnosis:  Weakness [R53.1] Dialysis complication [T82.9XXA] ESRD (end stage renal disease) on dialysis (HCC) [N18.6, Z99.2] Patient Active Problem List   Diagnosis Date Noted   Dialysis complication 11/18/2023   Acute hypoxic respiratory failure (HCC) 11/06/2023   Acute on chronic hypoxic respiratory failure (HCC) 11/05/2023   Dyspnea 10/14/2023   Postobstructive pneumonia 10/13/2023   Lung mass 10/13/2023   HLD (hyperlipidemia) 10/13/2023   Abdominal pain 10/13/2023   Ascites 10/13/2023   Nonspecific chest pain 07/07/2023   Influenza A 06/16/2023   Acute hypoxemic respiratory failure (HCC) 04/30/2023   Blindness of both eyes 04/30/2023   Elevated troponin 04/30/2023   Low back pain 04/30/2023   Productive cough 04/30/2023   Myocardial injury 11/03/2022   CAD (coronary artery disease) 11/03/2022   Hypothyroidism 11/03/2022   Thrombocytopenia (HCC) 11/03/2022   Aspiration pneumonitis (HCC) 11/03/2022   Abdominal distension 11/03/2022   HTN (hypertension) 11/03/2022   Pericardial effusion 11/03/2022  Community acquired pneumonia 09/14/2022   Anasarca (mild pulmonary edema, pleural and pericardial effusion, ascites on CT) 09/14/2022   Acute on chronic respiratory failure (HCC) 09/14/2022   Bacteremia 06/20/2022   Acute encephalopathy 06/19/2022   SIRS (systemic inflammatory response syndrome) (HCC) 06/19/2022   NSTEMI (non-ST elevated myocardial infarction) (HCC)  06/19/2022   Acute on chronic respiratory failure with hypoxia (HCC) 05/21/2022   Hypoxia 05/20/2022   COVID-19 virus infection 05/20/2022   Hyponatremia 02/10/2022   Anemia due to chronic kidney disease 01/21/2022   Hyperkalemia 01/14/2022   Malnutrition of moderate degree 12/24/2021   Oral thrush 12/22/2021   Physical deconditioning 12/20/2021   Acute metabolic encephalopathy 12/15/2021   Essential hypertension 12/15/2021   ESRD on hemodialysis (HCC) 12/15/2021   Altered mental status 10/08/2021   Hypertensive urgency 10/07/2021   COPD (chronic obstructive pulmonary disease) (HCC) 02/26/2021   GERD (gastroesophageal reflux disease) 02/26/2021   Hypertension 11/28/2020   Acute respiratory failure with hypoxia (HCC) 10/23/2019   Anemia of chronic disease 10/23/2019   Fluid overload 10/08/2018   Acute pulmonary edema (HCC) 09/12/2018   Scrotal edema 05/10/2017   Symptomatic anemia 04/30/2017   Anticoagulated on Coumadin  09/14/2016   Cardiac arrest (HCC) 08/03/2016   Severe sepsis versus SIRS 05/26/2016   Dialysis patient (HCC) 04/15/2016   ESRD needing dialysis (HCC) 02/12/2016   Hyperkalemia 05/20/2015   Hepatitis C 09/26/2013   Schizophrenia (HCC) 03/23/2013   Essential hypertension, benign 03/23/2013   Chronic kidney disease 03/23/2013   PCP:  Janann Meadow, NP Pharmacy:   [facility] [place], [place] - [address] [place] [address] Phone: [phone] Fax: [phone]     Social Drivers of Health (SDOH) Social History: SDOH Screenings   Food Insecurity: Patient Declined (11/06/2023)  Housing: Patient Declined (11/06/2023)  Transportation Needs: Patient Declined (11/06/2023)  Recent Concern: Transportation Needs - Unmet Transportation Needs (10/13/2023)  Utilities: Patient Declined (11/06/2023)  Alcohol Screen: Low Risk  (10/05/2019)  Financial Resource Strain: Low Risk  (10/08/2018)  Physical Activity: Unknown  (10/08/2018)  Social Connections: Unknown (10/08/2018)  Stress: No Stress Concern Present (10/08/2018)  Tobacco Use: Low Risk  (11/18/2023)   SDOH Interventions:     Readmission Risk Interventions    11/09/2023    1:54 PM 10/15/2023    2:37 PM 07/06/2023    9:31 AM  Readmission Risk Prevention Plan  Transportation Screening Complete Complete Complete  PCP or Specialist Appt within 3-5 Days   Complete  Social Work Consult for Recovery Care Planning/Counseling   Complete  Palliative Care Screening   Complete  Medication Review Oceanographer) Complete  Complete  SW Recovery Care/Counseling Consult  Complete   Palliative Care Screening Complete Complete   Skilled Nursing Facility Not Applicable

## 2023-11-19 NOTE — Progress Notes (Signed)
 Attempted to call report to [place] with no answer. Left VM with call back number.

## 2023-11-19 NOTE — NC FL2 (Signed)
 [place]  MEDICAID FL2 LEVEL OF CARE FORM     IDENTIFICATION  Patient Name: Todd Brown Birthdate: Mar 11, 1961 Sex: male Admission Date (Current Location): 11/18/2023  [facility] and [place] Number:  Chiropodist and Address:  [facility], [address], [place], [place] [address]      Provider Number: [id number]  Attending Physician Name and Address:  Cornelius Dill, DO  Relative Name and Phone Number:       Current Level of Care: Hospital Recommended Level of Care: Assisted Living Facility Prior Approval Number:    Date Approved/Denied:   PASRR Number:    Discharge Plan: Other (Comment) (ALF)    Current Diagnoses: Patient Active Problem List   Diagnosis Date Noted   Dialysis complication 11/18/2023   Acute hypoxic respiratory failure (HCC) 11/06/2023   Acute on chronic hypoxic respiratory failure (HCC) 11/05/2023   Dyspnea 10/14/2023   Postobstructive pneumonia 10/13/2023   Lung mass 10/13/2023   HLD (hyperlipidemia) 10/13/2023   Abdominal pain 10/13/2023   Ascites 10/13/2023   Nonspecific chest pain 07/07/2023   Influenza A 06/16/2023   Acute hypoxemic respiratory failure (HCC) 04/30/2023   Blindness of both eyes 04/30/2023   Elevated troponin 04/30/2023   Low back pain 04/30/2023   Productive cough 04/30/2023   Myocardial injury 11/03/2022   CAD (coronary artery disease) 11/03/2022   Hypothyroidism 11/03/2022   Thrombocytopenia (HCC) 11/03/2022   Aspiration pneumonitis (HCC) 11/03/2022   Abdominal distension 11/03/2022   HTN (hypertension) 11/03/2022   Pericardial effusion 11/03/2022   Community acquired pneumonia 09/14/2022   Anasarca (mild pulmonary edema, pleural and pericardial effusion, ascites on CT) 09/14/2022   Acute on chronic respiratory failure (HCC) 09/14/2022   Bacteremia 06/20/2022   Acute encephalopathy 06/19/2022   SIRS (systemic inflammatory response syndrome) (HCC) 06/19/2022   NSTEMI (non-ST  elevated myocardial infarction) (HCC) 06/19/2022   Acute on chronic respiratory failure with hypoxia (HCC) 05/21/2022   Hypoxia 05/20/2022   COVID-19 virus infection 05/20/2022   Hyponatremia 02/10/2022   Anemia due to chronic kidney disease 01/21/2022   Hyperkalemia 01/14/2022   Malnutrition of moderate degree 12/24/2021   Oral thrush 12/22/2021   Physical deconditioning 12/20/2021   Acute metabolic encephalopathy 12/15/2021   Essential hypertension 12/15/2021   ESRD on hemodialysis (HCC) 12/15/2021   Altered mental status 10/08/2021   Hypertensive urgency 10/07/2021   COPD (chronic obstructive pulmonary disease) (HCC) 02/26/2021   GERD (gastroesophageal reflux disease) 02/26/2021   Hypertension 11/28/2020   Acute respiratory failure with hypoxia (HCC) 10/23/2019   Anemia of chronic disease 10/23/2019   Fluid overload 10/08/2018   Acute pulmonary edema (HCC) 09/12/2018   Scrotal edema 05/10/2017   Symptomatic anemia 04/30/2017   Anticoagulated on Coumadin  09/14/2016   Cardiac arrest (HCC) 08/03/2016   Severe sepsis versus SIRS 05/26/2016   Dialysis patient (HCC) 04/15/2016   ESRD needing dialysis (HCC) 02/12/2016   Hyperkalemia 05/20/2015   Hepatitis C 09/26/2013   Schizophrenia (HCC) 03/23/2013   Essential hypertension, benign 03/23/2013   Chronic kidney disease 03/23/2013    Orientation RESPIRATION BLADDER Height & Weight     Self, Place  O2 (Nasal Cannula 3 L)  (Anuria) Weight: 145 lb 15.1 oz (66.2 kg) Height:  6' 3 (190.5 cm)  BEHAVIORAL SYMPTOMS/MOOD NEUROLOGICAL BOWEL NUTRITION STATUS     (None) Continent Diet (Heart healthy)  AMBULATORY STATUS COMMUNICATION OF NEEDS Skin     Verbally Other (Comment) (Scratch marks.)  Personal Care Assistance Level of Assistance              Functional Limitations Info  Sight, Hearing, Speech Sight Info: Impaired (Blind) Hearing Info: Adequate Speech Info: Impaired    SPECIAL CARE FACTORS  FREQUENCY                       Contractures Contractures Info: Not present    Additional Factors Info  Code Status, Allergies Code Status Info: DNR Allergies Info: Chlorpromazine           Current Medications (11/19/2023):  This is the current hospital active medication list Current Facility-Administered Medications  Medication Dose Route Frequency Provider Last Rate Last Admin   acetaminophen  (TYLENOL ) tablet 650 mg  650 mg Oral Q6H PRN Foust, Katy L, NP   650 mg at 11/19/23 1610   Or   acetaminophen  (TYLENOL ) suppository 650 mg  650 mg Rectal Q6H PRN Foust, Katy L, NP       albumin  human 25 % solution 25 g  25 g Intravenous Once Shah, Pratik D, DO       albuterol  (PROVENTIL ) (2.5 MG/3ML) 0.083% nebulizer solution 2.5 mg  2.5 mg Nebulization Q2H PRN Foust, Katy L, NP       carbamazepine  (TEGRETOL ) tablet 1,000 mg  1,000 mg Oral q morning Foust, Katy L, NP   1,000 mg at 11/19/23 0859   Chlorhexidine  Gluconate Cloth 2 % PADS 6 each  6 each Topical Q0600 Lateef, Munsoor, MD   6 each at 11/19/23 0545   ferrous sulfate  tablet 325 mg  325 mg Oral Q supper Foust, Katy L, NP   325 mg at 11/18/23 1732   fluticasone  furoate-vilanterol (BREO ELLIPTA ) 200-25 MCG/ACT 1 puff  1 puff Inhalation Daily Foust, Katy L, NP   1 puff at 11/19/23 0900   heparin  injection 5,000 Units  5,000 Units Subcutaneous Q8H Foust, Katy L, NP   5,000 Units at 11/19/23 0545   isosorbide  mononitrate (IMDUR ) 24 hr tablet 60 mg  60 mg Oral Daily Foust, Katy L, NP   60 mg at 11/19/23 0859   levothyroxine  (SYNTHROID ) tablet 125 mcg  125 mcg Oral Daily Foust, Katy L, NP   125 mcg at 11/19/23 0545   LORazepam  (ATIVAN ) tablet 1 mg  1 mg Oral Q8H PRN Foust, Katy L, NP   1 mg at 11/19/23 1324   melatonin tablet 2.5 mg  2.5 mg Oral QHS PRN Foust, Katy L, NP       multivitamin (RENA-VIT) tablet 1 tablet  1 tablet Oral QHS Foust, Katy L, NP       ondansetron  (ZOFRAN ) tablet 4 mg  4 mg Oral Q6H PRN Foust, Katy L, NP       Or    ondansetron  (ZOFRAN ) injection 4 mg  4 mg Intravenous Q6H PRN Foust, Katy L, NP       pantoprazole  (PROTONIX ) EC tablet 40 mg  40 mg Oral Daily Foust, Katy L, NP   40 mg at 11/19/23 0859   polyethylene glycol (MIRALAX  / GLYCOLAX ) packet 17 g  17 g Oral Daily PRN Foust, Katy L, NP       rosuvastatin  (CRESTOR ) tablet 10 mg  10 mg Oral QHS Foust, Katy L, NP         Discharge Medications: TAKE these medications     albuterol  108 (90 Base) MCG/ACT inhaler Commonly known as: VENTOLIN  HFA Inhale 1-2 puffs into the lungs every 4 (four) hours  as needed for wheezing or shortness of breath.    ascorbic acid  500 MG tablet Commonly known as: VITAMIN C  Take 500 mg by mouth daily.    b complex-vitamin c -folic acid  0.8 MG Tabs tablet Take 1 tablet by mouth at bedtime.    benzonatate  100 MG capsule Commonly known as: TESSALON  Take 100 mg by mouth every 6 (six) hours as needed.    carbamazepine  200 MG tablet Commonly known as: TEGRETOL  Take 1,000 mg by mouth every morning.    cholecalciferol  1000 units tablet Commonly known as: VITAMIN D  Take 1,000 Units by mouth in the morning.    ferrous sulfate  325 (65 FE) MG tablet Take 1 tablet (325 mg total) by mouth daily.    Fluticasone -Salmeterol 250-50 MCG/DOSE Aepb Commonly known as: ADVAIR Inhale 1 puff into the lungs 2 (two) times daily.    hyoscyamine  0.125 MG SL tablet Commonly known as: LEVSIN  SL Place 0.125 mg under the tongue every 4 (four) hours as needed for cramping.    ipratropium-albuterol  0.5-2.5 (3) MG/3ML Soln Commonly known as: DUONEB Take 3 mLs by nebulization every 6 (six) hours as needed.    isosorbide  mononitrate 60 MG 24 hr tablet Commonly known as: IMDUR  Take 1 tablet (60 mg total) by mouth daily.    levothyroxine  125 MCG tablet Commonly known as: Synthroid  Take 1 tablet (125 mcg total) by mouth daily.    LORazepam  1 MG tablet Commonly known as: ATIVAN  Take 1 tablet (1 mg total) by mouth every 8 (eight)  hours as needed for anxiety.    nicotine  21 mg/24hr patch Commonly known as: NICODERM CQ  - dosed in mg/24 hours Place 1 patch (21 mg total) onto the skin daily.    omeprazole  40 MG capsule Commonly known as: PRILOSEC Take 40 mg by mouth every morning.    polyethylene glycol 17 g packet Commonly known as: MIRALAX  / GLYCOLAX  Take 17 g by mouth daily as needed for mild constipation.    rosuvastatin  10 MG tablet Commonly known as: CRESTOR  Take 10 mg by mouth at bedtime.    senna-docusate 8.6-50 MG tablet Commonly known as: Senokot-S Take 1 tablet by mouth 2 (two) times daily as needed for mild constipation.    trihexyphenidyl  2 MG tablet Commonly known as: ARTANE  Take 1 tablet (2 mg total) by mouth 2 (two) times daily.    zinc  sulfate (50mg  elemental zinc ) 220 (50 Zn) MG capsule Take 220 mg by mouth daily.    ziprasidone  20 MG capsule Commonly known as: GEODON  Take 1 capsule (20 mg total) by mouth 2 (two) times daily with a meal.    Relevant Imaging Results:  Relevant Lab Results:   Additional Information SS#: [id number]  Odilia Bennett, LCSW

## 2023-11-19 NOTE — Discharge Summary (Signed)
 Physician Discharge Summary  Todd Brown ZOX:[id number] DOB: 1960/09/10 DOA: 11/18/2023  PCP: Janann Meadow, NP  Admit date: 11/18/2023  Discharge date: 11/19/2023  Admitted From:ALF  Disposition:  ALF  Recommendations for Outpatient Follow-up:  Follow up with PCP in 1-2 weeks Continue hemodialysis as routinely scheduled MWF with last session on day of discharge 6/20 Continue medications as noted below  Home Health:None  Equipment/Devices:Has home 2L [place]  Discharge Condition:Stable  CODE STATUS: DNR  Diet recommendation: Heart Healthy  Brief/Interim Summary: Todd Brown is a 63 y.o. year old male with past medical history of ESRD on HD (MWF), schizophrenia, hypertension, hyperlipidemia, COPD with chronic hypoxic respiratory failure on 2 L, CAD, recurrent ascites requiring paracentesis, hypothyroidism, and blindness.  He presents to the ED today because unable to be dialyzed yesterday secondary to the dialysis center unable to get him out of the chair stating that they needed a [facility] lift.  Per guardian the dialysis center has obtained a [facility] lift today and should be able to dialyze him going forward.  He has undergone hemodialysis without any significant concerns noted and has also undergone paracentesis due to ascites on 6/20 with 3.2 L of straw-colored fluid removed and has received albumin  infusion.  He is overall in stable condition for discharge and should continue dialysis as previously scheduled.  No other complaints or concerns noted.  Discharge Diagnoses:  Principal Problem:   Dialysis complication Active Problems:   Lung mass   ESRD on hemodialysis (HCC)   HLD (hyperlipidemia)   COPD (chronic obstructive pulmonary disease) (HCC)   Ascites   Blindness of both eyes   Schizophrenia (HCC)   Essential hypertension   CAD (coronary artery disease)   Hypothyroidism  Principal discharge diagnosis: Dialysis complication requiring inpatient hemodialysis as well as  ascites status post paracentesis.  Discharge Instructions  Discharge Instructions     Diet - low sodium heart healthy   Complete by: As directed    Increase activity slowly   Complete by: As directed       Allergies as of 11/19/2023       Reactions   Chlorpromazine Other (See Comments)   Reaction:  Unknown , pt states it makes him feel real bad Reaction:  Unknown , pt states it makes him feel real bad        Medication List     TAKE these medications    albuterol  108 (90 Base) MCG/ACT inhaler Commonly known as: VENTOLIN  HFA Inhale 1-2 puffs into the lungs every 4 (four) hours as needed for wheezing or shortness of breath.   ascorbic acid  500 MG tablet Commonly known as: VITAMIN C  Take 500 mg by mouth daily.   b complex-vitamin c -folic acid  0.8 MG Tabs tablet Take 1 tablet by mouth at bedtime.   benzonatate  100 MG capsule Commonly known as: TESSALON  Take 100 mg by mouth every 6 (six) hours as needed.   carbamazepine  200 MG tablet Commonly known as: TEGRETOL  Take 1,000 mg by mouth every morning.   cholecalciferol  1000 units tablet Commonly known as: VITAMIN D  Take 1,000 Units by mouth in the morning.   ferrous sulfate  325 (65 FE) MG tablet Take 1 tablet (325 mg total) by mouth daily.   Fluticasone -Salmeterol 250-50 MCG/DOSE Aepb Commonly known as: ADVAIR Inhale 1 puff into the lungs 2 (two) times daily.   hyoscyamine  0.125 MG SL tablet Commonly known as: LEVSIN  SL Place 0.125 mg under the tongue every 4 (four) hours as needed for cramping.  ipratropium-albuterol  0.5-2.5 (3) MG/3ML Soln Commonly known as: DUONEB Take 3 mLs by nebulization every 6 (six) hours as needed.   isosorbide  mononitrate 60 MG 24 hr tablet Commonly known as: IMDUR  Take 1 tablet (60 mg total) by mouth daily.   levothyroxine  125 MCG tablet Commonly known as: Synthroid  Take 1 tablet (125 mcg total) by mouth daily.   LORazepam  1 MG tablet Commonly known as: ATIVAN  Take 1  tablet (1 mg total) by mouth every 8 (eight) hours as needed for anxiety.   nicotine  21 mg/24hr patch Commonly known as: NICODERM CQ  - dosed in mg/24 hours Place 1 patch (21 mg total) onto the skin daily.   omeprazole  40 MG capsule Commonly known as: PRILOSEC Take 40 mg by mouth every morning.   polyethylene glycol 17 g packet Commonly known as: MIRALAX  / GLYCOLAX  Take 17 g by mouth daily as needed for mild constipation.   rosuvastatin  10 MG tablet Commonly known as: CRESTOR  Take 10 mg by mouth at bedtime.   senna-docusate 8.6-50 MG tablet Commonly known as: Senokot-S Take 1 tablet by mouth 2 (two) times daily as needed for mild constipation.   trihexyphenidyl  2 MG tablet Commonly known as: ARTANE  Take 1 tablet (2 mg total) by mouth 2 (two) times daily.   zinc  sulfate (50mg  elemental zinc ) 220 (50 Zn) MG capsule Take 220 mg by mouth daily.   ziprasidone  20 MG capsule Commonly known as: GEODON  Take 1 capsule (20 mg total) by mouth 2 (two) times daily with a meal.        Follow-up Information     Janann Meadow, NP. Schedule an appointment as soon as possible for a visit in 1 week(s).   Specialty: Family Medicine Contact information: [address] [place] [address] [phone]                Allergies  Allergen Reactions   Chlorpromazine Other (See Comments)    Reaction:  Unknown , pt states it makes him feel real bad Reaction:  Unknown , pt states it makes him feel real bad     Consultations: Nephrology IR   Procedures/Studies: US  Paracentesis Result Date: 11/19/2023 INDICATION: 63 year old male with a history of end-stage renal disease, with recurrent ascites. IR was requested for diagnostic and therapeutic paracentesis. EXAM: ULTRASOUND GUIDED DIAGNOSTIC AND THERAPEUTIC PARACENTESIS MEDICATIONS: 7 cc of 1% lidocaine  COMPLICATIONS: None immediate. PROCEDURE: Informed written consent was obtained from the patient after a discussion of the  risks, benefits and alternatives to treatment. A timeout was performed prior to the initiation of the procedure. Initial ultrasound scanning demonstrates a large amount of ascites within the right lower abdominal quadrant. The right lower abdomen was prepped and draped in the usual sterile fashion. 1% lidocaine  was used for local anesthesia. Following this, a 6 Fr Safe-T-Centesis catheter was introduced. An ultrasound image was saved for documentation purposes. The paracentesis was performed. The catheter was removed and a dressing was applied. The patient tolerated the procedure well without immediate post procedural complication. FINDINGS: A total of approximately 3.2 L of clear, straw-colored peritoneal fluid was removed. Samples were sent to the laboratory as requested by the clinical team. IMPRESSION: Successful ultrasound-guided paracentesis yielding 3.2 liters of peritoneal fluid. Procedure performed by Lambert Pillion, PA-C Electronically Signed   By: Erica Hau M.D.   On: 11/19/2023 15:34   DG Chest Portable 1 View Result Date: 11/18/2023 CLINICAL DATA:  Shortness of breath EXAM: PORTABLE CHEST 1 VIEW COMPARISON:  11/05/2023 chest x-ray and  CT FINDINGS: Slightly progressive irregular left infrahilar density. Underlying emphysema. Increased likely moderate right pleural effusion. Right hilar/infrahilar opacity corresponding to suspected mass on CT, progressive airspace disease at the right base. Stable cardiac size. No pneumothorax IMPRESSION: 1. Increased likely moderate right pleural effusion. 2. Right hilar/infrahilar opacity corresponding to suspected mass on CT, progressive airspace disease at the right base. 3. Slightly progressive irregular left infrahilar density/consolidation/possible mass. Electronically Signed   By: Esmeralda Hedge M.D.   On: 11/18/2023 15:51   CT Chest Wo Contrast Result Date: 11/05/2023 CLINICAL DATA:  Shortness of breath and hypoxia. Possible aspiration with pneumonia not  excluded on a portable chest radiograph earlier today. There was a continued mass-like opacity in the right lung suspicious for underlying malignancy. EXAM: CT CHEST WITHOUT CONTRAST TECHNIQUE: Multidetector CT imaging of the chest was performed following the standard protocol without IV contrast. RADIATION DOSE REDUCTION: This exam was performed according to the departmental dose-optimization program which includes automated exposure control, adjustment of the mA and/or kV according to patient size and/or use of iterative reconstruction technique. COMPARISON:  Chest CT dated 10/13/2023. Portable chest radiographs obtained earlier today and on 10/13/2023. FINDINGS: Cardiovascular: Mild atheromatous calcifications, including the coronary arteries and aorta. Stable mildly enlarged heart. Slight increase in size of a small pericardial effusion with a maximum thickness of 6 mm. The central pulmonary arteries remain enlarged with a main pulmonary artery diameter of 3.9 cm. Mediastinum/Nodes: Enlarged right paratracheal lymph nodes are again demonstrated. A previously demonstrated index 12 mm short axis node has a short axis diameter of 10 mm today. Unremarkable thyroid  gland, trachea and esophagus. A small hiatal hernia is again noted. Lungs/Pleura: Again demonstrated is dense consolidation in the lower lobes with air bronchograms and a more confluent area of density in the right lower lobe suggesting a mass. This includes a hilar component encasing the proximal right upper lobe, right lower lobe and bronchus intermedius with diffuse narrowing of those bronchi, without significant change. The combined hilar and right lower lobe mass-like component currently measures 10.9 x 7.9 cm on image number 86/2, previously 9.5 x 6.8 cm. The previously demonstrated 12 mm right lower lobe nodule currently measures 13 mm in maximum diameter on image number 81/4. No new nodules seen. Stable mild changes of centrilobular emphysema  throughout both lungs. Moderate-sized pleural effusion with probable partial loculations has increased mildly in size. No significant change in a small left pleural effusion. Upper Abdomen: A moderate to large amount of ascites is again demonstrated with enlarged lateral segment left lobe and caudate lobes of the liver suggesting possible cirrhosis of the liver. The spleen remains normal in size. Both kidneys remain small and contain multiple incompletely evaluated cysts not needing specific imaging follow-up at this time as well as a 2 mm calculus in each kidney. Musculoskeletal: Mild thoracic and moderate lower cervical spine degenerative changes. Stable healed mid sternal fracture. IMPRESSION: 1. Stable dense consolidation in the lower lobes with air bronchograms and a more confluent area of density in the right lower lobe suggesting a mass. This includes a hilar component encasing the proximal right upper lobe, right lower lobe and bronchus intermedius with diffuse narrowing of those bronchi. The combined hilar and right lower lobe mass-like component has increased mildly in size, currently measuring 10.9 x 7.9 cm. 2. Mild increase in size of a moderate-sized right pleural effusion with probable partial loculations. 3. No significant change in a small left pleural effusion. 4. Slight increase in size of a small  pericardial effusion. 5. Stable to slightly improved mild right paratracheal adenopathy. This could be metastatic or reactive in nature. 6. Stable mild changes of centrilobular emphysema throughout both lungs. 7. Stable enlargement of the central pulmonary arteries, suggesting pulmonary arterial hypertension. 8. Stable moderate to large amount of ascites in the upper abdomen with liver findings suggesting possible cirrhosis of the liver. 9. Stable bilateral renal atrophy with multiple incompletely evaluated cysts not needing specific imaging follow-up at this time as well as a 2 mm calculus in each  kidney. 10. Stable small hiatal hernia. Aortic Atherosclerosis (ICD10-I70.0) and Emphysema (ICD10-J43.9). Electronically Signed   By: Catherin Closs M.D.   On: 11/05/2023 16:01   DG Chest Portable 1 View Result Date: 11/05/2023 CLINICAL DATA:  Shortness of breath. EXAM: PORTABLE CHEST 1 VIEW COMPARISON:  10/13/2023. FINDINGS: Stable cardiomediastinal silhouette. Similar round lobular right mid to lower lobe opacity, corresponding to area of masslike opacity noted in the right lung on the prior CT chest dated 10/13/2023. Persistent right basilar atelectasis with small right effusion. Similar streaky opacities in the left lung with left lower lobe nodular appearing opacity, which may correspond to the previously noted left lower lobe nodule on the prior CT chest 10/13/2023. No pneumothorax. No acute osseous abnormality. IMPRESSION: 1. Similar round lobular right mid to lower lobe opacity, corresponding to area of reported masslike opacity in the right lung on the prior CT chest dated 10/13/2023, concerning for underlying malignancy. Superimposed infiltrate or edema cannot be excluded. 2. Similar streaky opacities in the left lung, which could reflect infiltrate or edema, with left lower lobe nodular appearing opacity, which may correspond to the previously noted left lower lobe nodule on the prior CT chest. Electronically Signed   By: Mannie Seek M.D.   On: 11/05/2023 13:17     Discharge Exam: Vitals:   11/19/23 0729 11/19/23 1300  BP: 123/73 95/66  Pulse: 73 75  Resp: 19   Temp: 98.1 F (36.7 C)   SpO2: 97% 93%   Vitals:   11/19/23 0100 11/19/23 0329 11/19/23 0729 11/19/23 1300  BP: 117/88 (!) 149/97 123/73 95/66  Pulse: 77 79 73 75  Resp: 18 20 19    Temp: 97.6 F (36.4 C) 97.8 F (36.6 C) 98.1 F (36.7 C)   TempSrc: Axillary     SpO2: 97%  97% 93%  Weight:      Height:        General: Pt is alert, awake, not in acute distress Cardiovascular: RRR, S1/S2 +, no rubs, no  gallops Respiratory: CTA bilaterally, no wheezing, no rhonchi Abdominal: Mildly distended, NT, ND, bowel sounds + Extremities: no edema, no cyanosis    The results of significant diagnostics from this hospitalization (including imaging, microbiology, ancillary and laboratory) are listed below for reference.     Microbiology: No results found for this or any previous visit (from the past 240 hours).   Labs: BNP (last 3 results) Recent Labs    06/16/23 1518  BNP 209.6*   Basic Metabolic Panel: Recent Labs  Lab 11/18/23 1255 11/18/23 1746 11/19/23 0400  NA 139  --  137  K 4.8  --  4.4  CL 96*  --  96*  CO2 23  --  25  GLUCOSE 94  --  74  BUN 66*  --  44*  CREATININE 8.76*  --  6.43*  CALCIUM  9.0  --  8.7*  MG  --  2.6* 2.2  PHOS  --  8.0*  --  Liver Function Tests: Recent Labs  Lab 11/18/23 1255  AST 20  ALT 13  ALKPHOS 52  BILITOT 0.8  PROT 7.1  ALBUMIN  2.8*   No results for input(s): LIPASE, AMYLASE in the last 168 hours. No results for input(s): AMMONIA in the last 168 hours. CBC: Recent Labs  Lab 11/18/23 1255 11/19/23 0400  WBC 7.6 6.5  NEUTROABS 5.7  --   HGB 14.9 14.6  HCT 45.5 44.2  MCV 98.1 98.0  PLT 240 206   Cardiac Enzymes: No results for input(s): CKTOTAL, CKMB, CKMBINDEX, TROPONINI in the last 168 hours. BNP: Invalid input(s): POCBNP CBG: No results for input(s): GLUCAP in the last 168 hours. D-Dimer No results for input(s): DDIMER in the last 72 hours. Hgb A1c No results for input(s): HGBA1C in the last 72 hours. Lipid Profile No results for input(s): CHOL, HDL, LDLCALC, TRIG, CHOLHDL, LDLDIRECT in the last 72 hours. Thyroid  function studies No results for input(s): TSH, T4TOTAL, T3FREE, THYROIDAB in the last 72 hours.  Invalid input(s): FREET3 Anemia work up No results for input(s): VITAMINB12, FOLATE, FERRITIN, TIBC, IRON , RETICCTPCT in the last 72  hours. Urinalysis    Component Value Date/Time   COLORURINE YELLOW (A) 05/17/2015 0012   APPEARANCEUR HAZY (A) 05/17/2015 0012   LABSPEC 1.009 05/17/2015 0012   PHURINE 7.0 05/17/2015 0012   GLUCOSEU NEGATIVE 05/17/2015 0012   HGBUR 1+ (A) 05/17/2015 0012   BILIRUBINUR NEGATIVE 05/17/2015 0012   KETONESUR NEGATIVE 05/17/2015 0012   PROTEINUR 30 (A) 05/17/2015 0012   NITRITE NEGATIVE 05/17/2015 0012   LEUKOCYTESUR 3+ (A) 05/17/2015 0012   Sepsis Labs Recent Labs  Lab 11/18/23 1255 11/19/23 0400  WBC 7.6 6.5   Microbiology No results found for this or any previous visit (from the past 240 hours).   Time coordinating discharge: 35 minutes  SIGNED:   Cornelius Dill, DO [facility] 11/19/2023, 3:40 PM  If 7PM-7AM, please contact night-coverage [email or web address]

## 2023-11-19 NOTE — TOC Transition Note (Signed)
 Transition of Care [facility]) - Discharge Note   Patient Details  Name: Todd Brown MRN: [id number] Date of Birth: [birth year]  [facility]) CM/SW Contact:  Odilia Bennett, LCSW Phone Number: 11/19/2023, 4:34 PM   Clinical Narrative:  Patient has orders to discharge back to [facility] today. RN will call report to [phone]. CSW requested authorization for ambulance transport through [facility] [phone]. Spoke to [place]). They are aware patient will be ready around 6:30 pm. No further concerns. CSW signing off.  Final next level of care: Assisted Living Barriers to Discharge: Barriers Resolved   Patient Goals and CMS Choice            Discharge Placement                Patient to be transferred to facility by: Ambulance Transport Name of family member notified: Marcia Setters Patient and family notified of of transfer: 11/19/23  Discharge Plan and Services Additional resources added to the After Visit Summary for       Post Acute Care Choice: NA                               Social Drivers of Health (SDOH) Interventions SDOH Screenings   Food Insecurity: Patient Declined (11/06/2023)  Housing: Patient Declined (11/06/2023)  Transportation Needs: Patient Declined (11/06/2023)  Recent Concern: Transportation Needs - Unmet Transportation Needs (10/13/2023)  Utilities: Patient Declined (11/06/2023)  Alcohol Screen: Low Risk  (10/05/2019)  Financial Resource Strain: Low Risk  (10/08/2018)  Physical Activity: Unknown (10/08/2018)  Social Connections: Unknown (10/08/2018)  Stress: No Stress Concern Present (10/08/2018)  Tobacco Use: Low Risk  (11/18/2023)     Readmission Risk Interventions    11/09/2023    1:54 PM 10/15/2023    2:37 PM 07/06/2023    9:31 AM  Readmission Risk Prevention Plan  Transportation Screening Complete Complete Complete  PCP or Specialist Appt within 3-5 Days   Complete  Social Work Consult for Recovery Care  Planning/Counseling   Complete  Palliative Care Screening   Complete  Medication Review Oceanographer) Complete  Complete  SW Recovery Care/Counseling Consult  Complete   Palliative Care Screening Complete Complete   Skilled Nursing Facility Not Applicable

## 2023-11-19 NOTE — Procedures (Signed)
 PROCEDURE SUMMARY:  Successful image-guided paracentesis from the left abdomen.  Yielded 3.2 liters of clear, straw-colored peritoneal fluid.  No immediate complications.  EBL: zero Patient tolerated well.   Specimen was sent for labs.  Please see imaging section of Epic for full dictation.  Gordy Lauber Lucienne Sawyers PA-C 11/19/2023 2:45 PM

## 2023-11-19 NOTE — Progress Notes (Signed)
 Bedside.  Alert and oriented x3 Informed consent signed and in chart.   TX duration:3.5 hours  Patient tolerated well.  Alert, without acute distress.  Hand-off given to patient's nurse.   Access used: fistula Access issues: none  Total UF removed: 1500 mls Medication(s) given: none Post HD VS: 117/88 Post HD weight: 65.6 kg    11/19/23 0100  Vitals  Temp 97.6 F (36.4 C)  Temp Source Axillary  BP 117/88  MAP (mmHg) 98  BP Location Right Arm  BP Method Automatic  Patient Position (if appropriate) Lying  Pulse Rate 77  Pulse Rate Source Monitor  ECG Heart Rate 83  Resp 18  Oxygen Therapy  SpO2 97 %  O2 Device Nasal Cannula  O2 Flow Rate (L/min) 3 L/min  Patient Activity (if Appropriate) In bed  Pulse Oximetry Type Continuous  Post Treatment  Dialyzer Clearance Lightly streaked  Hemodialysis Intake (mL) 0 mL  Liters Processed 63.2  Fluid Removed (mL) 1500 mL  Tolerated HD Treatment Yes  Post-Hemodialysis Comments HD tx achieved as expected, tolerated well, no complaints.  pt is stable.  AVG/AVF Arterial Site Held (minutes) 10 minutes  AVG/AVF Venous Site Held (minutes) 10 minutes  Note  Patient Observations pt is in bed resting, eyes closed.  Fistula / Graft Left Upper arm Arteriovenous fistula  No Placement Date or Time found.   Orientation: Left  Access Location: Upper arm  Access Type: Arteriovenous fistula  Site Condition No complications  Fistula / Graft Assessment Present;Thrill;Bruit  Status Deaccessed  Drainage Description None      [facility]

## 2023-11-20 LAB — HEPATITIS B SURFACE ANTIBODY, QUANTITATIVE: Hep B S AB Quant (Post): 218 m[IU]/mL

## 2023-11-20 NOTE — Progress Notes (Signed)
 Pt picked up by EMS. VSS. IV removed. Pt tolerated well. Pt has BM. Bath given.

## 2023-11-22 LAB — BODY FLUID CULTURE W GRAM STAIN
Culture: NO GROWTH
Gram Stain: NONE SEEN

## 2023-11-30 ENCOUNTER — Emergency Department: Payer: MEDICAID

## 2023-11-30 ENCOUNTER — Emergency Department
Admission: EM | Admit: 2023-11-30 | Discharge: 2023-12-31 | Disposition: E | Payer: MEDICAID | Attending: Emergency Medicine | Admitting: Emergency Medicine

## 2023-11-30 ENCOUNTER — Other Ambulatory Visit: Payer: Self-pay

## 2023-11-30 DIAGNOSIS — E039 Hypothyroidism, unspecified: Secondary | ICD-10-CM | POA: Insufficient documentation

## 2023-11-30 DIAGNOSIS — N186 End stage renal disease: Secondary | ICD-10-CM | POA: Insufficient documentation

## 2023-11-30 DIAGNOSIS — J449 Chronic obstructive pulmonary disease, unspecified: Secondary | ICD-10-CM | POA: Insufficient documentation

## 2023-11-30 DIAGNOSIS — R451 Restlessness and agitation: Secondary | ICD-10-CM | POA: Diagnosis not present

## 2023-11-30 DIAGNOSIS — R1084 Generalized abdominal pain: Secondary | ICD-10-CM | POA: Diagnosis not present

## 2023-11-30 DIAGNOSIS — R4182 Altered mental status, unspecified: Secondary | ICD-10-CM | POA: Diagnosis present

## 2023-11-30 DIAGNOSIS — E875 Hyperkalemia: Secondary | ICD-10-CM | POA: Diagnosis not present

## 2023-11-30 DIAGNOSIS — I120 Hypertensive chronic kidney disease with stage 5 chronic kidney disease or end stage renal disease: Secondary | ICD-10-CM | POA: Diagnosis not present

## 2023-11-30 DIAGNOSIS — Z992 Dependence on renal dialysis: Secondary | ICD-10-CM | POA: Diagnosis not present

## 2023-11-30 DIAGNOSIS — R062 Wheezing: Secondary | ICD-10-CM | POA: Diagnosis not present

## 2023-11-30 LAB — BLOOD GAS, VENOUS
Acid-base deficit: 4 mmol/L — ABNORMAL HIGH (ref 0.0–2.0)
Bicarbonate: 22.6 mmol/L (ref 20.0–28.0)
O2 Saturation: 51.4 %
Patient temperature: 37
pCO2, Ven: 46 mmHg (ref 44–60)
pH, Ven: 7.3 (ref 7.25–7.43)
pO2, Ven: 35 mmHg (ref 32–45)

## 2023-11-30 LAB — CBC WITH DIFFERENTIAL/PLATELET
Abs Immature Granulocytes: 0.04 10*3/uL (ref 0.00–0.07)
Basophils Absolute: 0 10*3/uL (ref 0.0–0.1)
Basophils Relative: 0 %
Eosinophils Absolute: 0 10*3/uL (ref 0.0–0.5)
Eosinophils Relative: 0 %
HCT: 48.1 % (ref 39.0–52.0)
Hemoglobin: 15.9 g/dL (ref 13.0–17.0)
Immature Granulocytes: 0 %
Lymphocytes Relative: 7 %
Lymphs Abs: 0.8 10*3/uL (ref 0.7–4.0)
MCH: 32.4 pg (ref 26.0–34.0)
MCHC: 33.1 g/dL (ref 30.0–36.0)
MCV: 98.2 fL (ref 80.0–100.0)
Monocytes Absolute: 0.7 10*3/uL (ref 0.1–1.0)
Monocytes Relative: 6 %
Neutro Abs: 9.4 10*3/uL — ABNORMAL HIGH (ref 1.7–7.7)
Neutrophils Relative %: 87 %
Platelets: 308 10*3/uL (ref 150–400)
RBC: 4.9 MIL/uL (ref 4.22–5.81)
RDW: 14.3 % (ref 11.5–15.5)
WBC: 11 10*3/uL — ABNORMAL HIGH (ref 4.0–10.5)
nRBC: 0 % (ref 0.0–0.2)

## 2023-11-30 LAB — RESP PANEL BY RT-PCR (RSV, FLU A&B, COVID)  RVPGX2
Influenza A by PCR: NEGATIVE
Influenza B by PCR: NEGATIVE
Resp Syncytial Virus by PCR: NEGATIVE
SARS Coronavirus 2 by RT PCR: NEGATIVE

## 2023-11-30 LAB — COMPREHENSIVE METABOLIC PANEL WITH GFR
ALT: 14 U/L (ref 0–44)
AST: 23 U/L (ref 15–41)
Albumin: 2.8 g/dL — ABNORMAL LOW (ref 3.5–5.0)
Alkaline Phosphatase: 54 U/L (ref 38–126)
Anion gap: 24 — ABNORMAL HIGH (ref 5–15)
BUN: 93 mg/dL — ABNORMAL HIGH (ref 8–23)
CO2: 20 mmol/L — ABNORMAL LOW (ref 22–32)
Calcium: 9.6 mg/dL (ref 8.9–10.3)
Chloride: 97 mmol/L — ABNORMAL LOW (ref 98–111)
Creatinine, Ser: 11.11 mg/dL — ABNORMAL HIGH (ref 0.61–1.24)
GFR, Estimated: 5 mL/min — ABNORMAL LOW (ref >60)
Glucose, Bld: 91 mg/dL (ref 70–99)
Potassium: 6.1 mmol/L — ABNORMAL HIGH (ref 3.5–5.1)
Sodium: 141 mmol/L (ref 135–145)
Total Bilirubin: 1.1 mg/dL (ref 0.0–1.2)
Total Protein: 6.9 g/dL (ref 6.5–8.1)

## 2023-11-30 LAB — PHOSPHORUS: Phosphorus: 9.9 mg/dL — ABNORMAL HIGH (ref 2.5–4.6)

## 2023-11-30 LAB — TROPONIN I (HIGH SENSITIVITY)
Troponin I (High Sensitivity): 42 ng/L — ABNORMAL HIGH (ref <18)
Troponin I (High Sensitivity): 41 ng/L — ABNORMAL HIGH (ref <18)

## 2023-11-30 LAB — LACTIC ACID, PLASMA: Lactic Acid, Venous: 1.7 mmol/L (ref 0.5–1.9)

## 2023-11-30 LAB — MAGNESIUM: Magnesium: 2.3 mg/dL (ref 1.7–2.4)

## 2023-11-30 LAB — PROTIME-INR
INR: 1.1 (ref 0.8–1.2)
Prothrombin Time: 14.3 s (ref 11.4–15.2)

## 2023-11-30 MED ORDER — HALOPERIDOL LACTATE 5 MG/ML IJ SOLN
2.0000 mg | Freq: Once | INTRAMUSCULAR | Status: AC
  Administered 2023-11-30: 2 mg via INTRAVENOUS
  Filled 2023-11-30: qty 1

## 2023-11-30 MED ORDER — VANCOMYCIN HCL IN DEXTROSE 1-5 GM/200ML-% IV SOLN
1000.0000 mg | Freq: Once | INTRAVENOUS | Status: AC
  Administered 2023-11-30: 1000 mg via INTRAVENOUS
  Filled 2023-11-30: qty 200

## 2023-11-30 MED ORDER — LACTATED RINGERS IV SOLN: INTRAVENOUS | Status: DC

## 2023-11-30 MED ORDER — SODIUM CHLORIDE 0.9 % IV SOLN
2.0000 g | Freq: Once | INTRAVENOUS | Status: AC
  Administered 2023-11-30: 2 g via INTRAVENOUS
  Filled 2023-11-30: qty 12.5

## 2023-12-05 LAB — CULTURE, BLOOD (SINGLE)
Culture: NO GROWTH
Special Requests: ADEQUATE

## 2023-12-21 ENCOUNTER — Ambulatory Visit (INDEPENDENT_AMBULATORY_CARE_PROVIDER_SITE_OTHER): Payer: MEDICAID | Admitting: Nurse Practitioner

## 2023-12-21 ENCOUNTER — Encounter (INDEPENDENT_AMBULATORY_CARE_PROVIDER_SITE_OTHER): Payer: MEDICAID

## 2023-12-31 NOTE — ED Triage Notes (Signed)
 Pt to ED via [facility] from [facility]. Pt refuses dialysis and becomes verbally aggressive and combative. Last treatment over a week ago. Pt gurgling, has mass in lungs.

## 2023-12-31 NOTE — Plan of Care (Signed)
     Referral received at 1407 for Todd Brown re: hospice/comfort care wishes.   Chart reviewed. As per chart review, patient expired at 1459 - prior to PMT being able to initiate discussions.   Lamarr Gunner, FNP-BC Palliative Medicine Team   No charge

## 2023-12-31 NOTE — ED Provider Notes (Addendum)
 [facility] Provider Note    Event Date/Time   First MD Initiated Contact with Patient 12/28/2023 1106     (approximate)   History   Altered Mental Status   HPI  Todd Brown is a 63 y.o. 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  According to EMS stated that they have been called out for altered mental status and he has been more altered when compared to his normal.  States that he has been refusing dialysis, usually goes to dialysis for 1 hour and then becomes verbally and violently combative and is discontinued from his dialysis session.  Did not get any dialysis on Monday and believes his last partial session was on Friday.  Unknown of any recent falls or head trauma.  Patient is currently a ward of the state and has a legal guardian with [place] and DSS.  They had discussions about comfort care/hospice and next steps however he currently is DNR with no comfort care measures or hospice in place.     Physical Exam   Triage Vital Signs: ED Triage Vitals  Encounter Vitals Group     BP      Girls Systolic BP Percentile      Girls Diastolic BP Percentile      Boys Systolic BP Percentile      Boys Diastolic BP Percentile      Pulse      Resp      Temp      Temp src      SpO2      Weight      Height      Head Circumference      Peak Flow      Pain Score      Pain Loc      Pain Education      Exclude from Growth Chart     Most recent vital signs: Vitals:   12/05/2023 1430 12/23/2023 1445  BP: (!) 141/99   Pulse:    Resp: (!) 26 (!) 27  Temp:    SpO2:      Physical Exam Constitutional:      Appearance: He is well-developed.  HENT:     Head: Atraumatic.   Eyes:     Conjunctiva/sclera: Conjunctivae normal.    Cardiovascular:      Rate and Rhythm: Tachycardia present. Rhythm irregular.  Pulmonary:     Effort: Respiratory distress present.     Breath sounds: Wheezing and rhonchi present.  Abdominal:     General: There is no distension.     Tenderness: There is abdominal tenderness.     Comments: Mild diffuse abdominal tenderness to palpation with no rebound or guarding   Musculoskeletal:     Cervical back: Normal range of motion.   Skin:    General: Skin is warm.   Neurological:     Mental Status: He is alert. Mental status is at baseline.     IMPRESSION / MDM / ASSESSMENT AND PLAN / ED COURSE  I reviewed the triage vital signs and the nursing notes.  Differential diagnosis including sepsis, pneumonia, electrolyte abnormality, emergent dialysis, progression of lung malignancy   EKG  I, Clotilda Punter, the attending physician, personally viewed and interpreted this ECG.  EKG showed sinus tachycardia with a rate of 102.  Prolonged PR  interval, QTc 444.  Nonspecific ST changes with significant artifact.  Irregular rhythm, tachycardic while on cardiac telemetry.  RADIOLOGY I independently reviewed imaging, my interpretation of imaging: Chest x-ray with lung mass with concern for possible right sided pneumonia  LABS (all labs ordered are listed, but only abnormal results are displayed) Labs interpreted as -    Labs Reviewed  CBC WITH DIFFERENTIAL/PLATELET - Abnormal; Notable for the following components:      Result Value   WBC 11.0 (*)    Neutro Abs 9.4 (*)    All other components within normal limits  BLOOD GAS, VENOUS - Abnormal; Notable for the following components:   Acid-base deficit 4.0 (*)    All other components within normal limits  COMPREHENSIVE METABOLIC PANEL WITH GFR - Abnormal; Notable for the following components:   Potassium 6.1 (*)    Chloride 97 (*)    CO2 20 (*)    BUN 93 (*)    Creatinine, Ser 11.11 (*)    Albumin  2.8 (*)    GFR, Estimated 5 (*)    Anion gap 24 (*)     All other components within normal limits  PHOSPHORUS - Abnormal; Notable for the following components:   Phosphorus 9.9 (*)    All other components within normal limits  TROPONIN I (HIGH SENSITIVITY) - Abnormal; Notable for the following components:   Troponin I (High Sensitivity) 42 (*)    All other components within normal limits  TROPONIN I (HIGH SENSITIVITY) - Abnormal; Notable for the following components:   Troponin I (High Sensitivity) 41 (*)    All other components within normal limits  RESP PANEL BY RT-PCR (RSV, FLU A&B, COVID)  RVPGX2  CULTURE, BLOOD (SINGLE)  LACTIC ACID, PLASMA  MAGNESIUM   PROTIME-INR     MDM   Clinical Course as of 12/27/2023 1522  Tue Nov 30, 2023  1253 Discussion with Todd - will reach out to Dr. Lazarus to have a further discussion [SM]  1349 Again reached out to his DSS team.  They had not plan to do any further dialysis.  Again discussed whether we are doing comfort care and hospice given that he has been verbally aggressive and violent at dialysis or proceeding with treatment at this time.  They stated that they will get back to me. [SM]  1408 Discussed with Dr. Marcelino who also reached out and talk to Todd who is his guardian from DSS.  Ultimately decided that the patient wished for comfort care and hospice measures.  Consult hospice and will consult hospitalist for admission for hospice and comfort measures.  Will not treat his hyperkalemia. [SM]    Clinical Course User Index [SM] Suzanne Kirsch, MD   Called into the room, patient became bradycardic, no longer breathing, does not have a heartbeat.  Time of death called at 1459.  Notified Todd Brown who is his legal guardian who will also notify his family.  They will help with funeral arrangements.  Will notify his primary care physician of his passing.  PROCEDURES:  Critical Care performed: yes  .Critical Care  Performed by: Suzanne Kirsch, MD Authorized by: Suzanne Kirsch, MD    Critical care provider statement:    Critical care time (minutes):  30   Critical care time was exclusive of:  Separately billable procedures and treating other patients   Critical care was necessary to treat or prevent imminent or life-threatening deterioration of the following conditions:  Endocrine crisis   Critical care was time  spent personally by me on the following activities:  Development of treatment plan with patient or surrogate, discussions with consultants, evaluation of patient's response to treatment, examination of patient, ordering and review of laboratory studies, ordering and review of radiographic studies, ordering and performing treatments and interventions, pulse oximetry, re-evaluation of patient's condition and review of old charts   Care discussed with: admitting provider     Patient's presentation is most consistent with acute presentation with potential threat to life or bodily function.   MEDICATIONS ORDERED IN ED: Medications  lactated ringers  infusion ( Intravenous New Bag/Given 12/24/2023 1254)  vancomycin  (VANCOCIN ) IVPB 1000 mg/200 mL premix (0 mg Intravenous Stopped 12/02/2023 1449)  ceFEPIme  (MAXIPIME ) 2 g in sodium chloride  0.9 % 100 mL IVPB (0 g Intravenous Stopped 12/06/2023 1334)  haloperidol  lactate (HALDOL ) injection 2 mg (2 mg Intravenous Given 12/05/2023 1426)    FINAL CLINICAL IMPRESSION(S) / ED DIAGNOSES   Final diagnoses:  Agitation  Hyperkalemia     Rx / DC Orders   ED Discharge Orders     None        Note:  This document was prepared using Dragon voice recognition software and may include unintentional dictation errors.   Suzanne Kirsch, MD 12/16/2023 1436    Suzanne Kirsch, MD 12/27/2023 8476

## 2023-12-31 NOTE — Consult Note (Signed)
 CODE SEPSIS - PHARMACY COMMUNICATION  **Broad Spectrum Antibiotics should be administered within 1 hour of Sepsis diagnosis**  Time Code Sepsis Called/Page Received: 1247  Antibiotics Ordered: cefepime  and vancomycin   Time of 1st antibiotic administration: 1256  Additional action taken by pharmacy: N/A  Kayla JULIANNA Blew ,PharmD Clinical Pharmacist  12/22/2023  1:00 PM

## 2023-12-31 NOTE — ED Notes (Signed)
 Pt changed out of clothes and into a gown after being brought in by EMS. Pt brief changed and stool cleaned off. This tech and RN noticed skin breakdown present on scrotum and rectal area. Pt belongings (clothes) in belongings bag at bedside. Fall bundle in place.

## 2023-12-31 NOTE — ED Notes (Signed)
 At 1458 this RN went to patients room. No chest rise or fall observed. No heart rate auscultated. Dr. Clotilda Mumma called to patients bedside and confirmed. TOD called 1459.

## 2023-12-31 NOTE — Sepsis Progress Note (Signed)
 Sepsis protocol monitored by e[employer] ?

## 2023-12-31 DEATH — deceased
